# Patient Record
Sex: Male | Born: 1950 | Race: White | Hispanic: No | Marital: Married | Smoking: Former smoker
Health system: Southern US, Community
[De-identification: ages and names within clinical notes are randomized; demographics above are authoritative.]

## PROBLEM LIST (undated history)

## (undated) DIAGNOSIS — R161 Splenomegaly, not elsewhere classified: Secondary | ICD-10-CM

## (undated) DIAGNOSIS — M797 Fibromyalgia: Secondary | ICD-10-CM

## (undated) DIAGNOSIS — I441 Atrioventricular block, second degree: Secondary | ICD-10-CM

## (undated) DIAGNOSIS — E039 Hypothyroidism, unspecified: Secondary | ICD-10-CM

## (undated) DIAGNOSIS — J449 Chronic obstructive pulmonary disease, unspecified: Secondary | ICD-10-CM

## (undated) DIAGNOSIS — M19049 Primary osteoarthritis, unspecified hand: Secondary | ICD-10-CM

## (undated) DIAGNOSIS — I509 Heart failure, unspecified: Secondary | ICD-10-CM

## (undated) DIAGNOSIS — E785 Hyperlipidemia, unspecified: Secondary | ICD-10-CM

## (undated) DIAGNOSIS — Z79899 Other long term (current) drug therapy: Secondary | ICD-10-CM

## (undated) DIAGNOSIS — J45909 Unspecified asthma, uncomplicated: Secondary | ICD-10-CM

## (undated) DIAGNOSIS — R0600 Dyspnea, unspecified: Secondary | ICD-10-CM

## (undated) DIAGNOSIS — I440 Atrioventricular block, first degree: Secondary | ICD-10-CM

## (undated) DIAGNOSIS — E119 Type 2 diabetes mellitus without complications: Secondary | ICD-10-CM

## (undated) DIAGNOSIS — M1990 Unspecified osteoarthritis, unspecified site: Secondary | ICD-10-CM

## (undated) DIAGNOSIS — I350 Nonrheumatic aortic (valve) stenosis: Secondary | ICD-10-CM

## (undated) DIAGNOSIS — Z950 Presence of cardiac pacemaker: Secondary | ICD-10-CM

## (undated) DIAGNOSIS — I4891 Unspecified atrial fibrillation: Secondary | ICD-10-CM

## (undated) DIAGNOSIS — I517 Cardiomegaly: Secondary | ICD-10-CM

## (undated) DIAGNOSIS — I340 Nonrheumatic mitral (valve) insufficiency: Secondary | ICD-10-CM

## (undated) DIAGNOSIS — J189 Pneumonia, unspecified organism: Secondary | ICD-10-CM

## (undated) DIAGNOSIS — G2581 Restless legs syndrome: Secondary | ICD-10-CM

## (undated) DIAGNOSIS — E669 Obesity, unspecified: Secondary | ICD-10-CM

## (undated) DIAGNOSIS — R011 Cardiac murmur, unspecified: Secondary | ICD-10-CM

## (undated) DIAGNOSIS — Z5181 Encounter for therapeutic drug level monitoring: Secondary | ICD-10-CM

## (undated) DIAGNOSIS — I10 Essential (primary) hypertension: Secondary | ICD-10-CM

## (undated) DIAGNOSIS — R0609 Other forms of dyspnea: Secondary | ICD-10-CM

## (undated) DIAGNOSIS — K219 Gastro-esophageal reflux disease without esophagitis: Secondary | ICD-10-CM

## (undated) DIAGNOSIS — G4733 Obstructive sleep apnea (adult) (pediatric): Secondary | ICD-10-CM

## (undated) DIAGNOSIS — K760 Fatty (change of) liver, not elsewhere classified: Secondary | ICD-10-CM

## (undated) HISTORY — DX: Obesity, unspecified: E66.9

## (undated) HISTORY — DX: Unspecified atrial fibrillation: I48.91

## (undated) HISTORY — DX: Cardiomegaly: I51.7

## (undated) HISTORY — DX: Nonrheumatic aortic (valve) stenosis: I35.0

## (undated) HISTORY — DX: Essential (primary) hypertension: I10

## (undated) HISTORY — DX: Unspecified osteoarthritis, unspecified site: M19.90

## (undated) HISTORY — DX: Obstructive sleep apnea (adult) (pediatric): G47.33

## (undated) HISTORY — DX: Nonrheumatic mitral (valve) insufficiency: I34.0

## (undated) HISTORY — PX: INSERT / REPLACE / REMOVE PACEMAKER: SUR710

## (undated) HISTORY — PX: KIDNEY SURGERY: SHX687

## (undated) HISTORY — DX: Hyperlipidemia, unspecified: E78.5

## (undated) HISTORY — DX: Hypothyroidism, unspecified: E03.9

## (undated) HISTORY — PX: LUMBAR DISC SURGERY: SHX700

## (undated) HISTORY — PX: FINGER TENDON REPAIR: SHX1640

## (undated) HISTORY — PX: BACK SURGERY: SHX140

## (undated) HISTORY — PX: FINGER SURGERY: SHX640

## (undated) HISTORY — DX: Fibromyalgia: M79.7

---
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
## 1998-02-13 ENCOUNTER — Ambulatory Visit (HOSPITAL_COMMUNITY): Admission: RE | Admit: 1998-02-13 | Discharge: 1998-02-13 | Payer: Self-pay | Admitting: Family Medicine

## 2000-03-05 ENCOUNTER — Encounter: Admission: RE | Admit: 2000-03-05 | Discharge: 2000-03-05 | Payer: Self-pay | Admitting: Family Medicine

## 2000-03-05 ENCOUNTER — Encounter: Payer: Self-pay | Admitting: Family Medicine

## 2002-02-23 ENCOUNTER — Encounter: Admission: RE | Admit: 2002-02-23 | Discharge: 2002-02-23 | Payer: Self-pay

## 2002-03-09 ENCOUNTER — Encounter: Payer: Self-pay | Admitting: Neurosurgery

## 2002-03-09 ENCOUNTER — Ambulatory Visit (HOSPITAL_COMMUNITY): Admission: RE | Admit: 2002-03-09 | Discharge: 2002-03-09 | Payer: Self-pay | Admitting: Neurosurgery

## 2002-03-11 HISTORY — PX: DOPPLER ECHOCARDIOGRAPHY: SHX263

## 2002-03-12 HISTORY — PX: CARDIOVASCULAR STRESS TEST: SHX262

## 2002-03-23 ENCOUNTER — Inpatient Hospital Stay (HOSPITAL_COMMUNITY): Admission: RE | Admit: 2002-03-23 | Discharge: 2002-03-24 | Payer: Self-pay | Admitting: Neurosurgery

## 2002-03-23 ENCOUNTER — Encounter: Payer: Self-pay | Admitting: Neurosurgery

## 2002-06-01 ENCOUNTER — Ambulatory Visit (HOSPITAL_COMMUNITY): Admission: RE | Admit: 2002-06-01 | Discharge: 2002-06-01 | Payer: Self-pay | Admitting: Cardiovascular Disease

## 2009-02-23 ENCOUNTER — Ambulatory Visit (HOSPITAL_BASED_OUTPATIENT_CLINIC_OR_DEPARTMENT_OTHER): Admission: RE | Admit: 2009-02-23 | Discharge: 2009-02-23 | Payer: Self-pay | Admitting: Family Medicine

## 2009-02-25 ENCOUNTER — Ambulatory Visit: Payer: Self-pay | Admitting: Internal Medicine

## 2009-08-07 HISTORY — PX: US ECHOCARDIOGRAPHY: HXRAD669

## 2010-05-03 ENCOUNTER — Ambulatory Visit (HOSPITAL_COMMUNITY)
Admission: RE | Admit: 2010-05-03 | Discharge: 2010-05-03 | Payer: Self-pay | Source: Home / Self Care | Attending: Cardiology | Admitting: Cardiology

## 2010-05-03 LAB — CBC
HCT: 42.5 % (ref 39.0–52.0)
Hemoglobin: 14.6 g/dL (ref 13.0–17.0)
MCH: 30.5 pg (ref 26.0–34.0)
MCHC: 34.4 g/dL (ref 30.0–36.0)
MCV: 88.9 fL (ref 78.0–100.0)
Platelets: 195 10*3/uL (ref 150–400)
RBC: 4.78 MIL/uL (ref 4.22–5.81)
RDW: 13.7 % (ref 11.5–15.5)
WBC: 9.3 10*3/uL (ref 4.0–10.5)

## 2010-05-03 LAB — BASIC METABOLIC PANEL
BUN: 16 mg/dL (ref 6–23)
CO2: 31 mEq/L (ref 19–32)
Calcium: 8.8 mg/dL (ref 8.4–10.5)
Chloride: 100 mEq/L (ref 96–112)
Creatinine, Ser: 1.19 mg/dL (ref 0.4–1.5)
GFR calc Af Amer: >60 mL/min (ref >60)
GFR calc non Af Amer: >60 mL/min (ref >60)
Glucose, Bld: 96 mg/dL (ref 70–99)
Potassium: 3.4 mEq/L — ABNORMAL LOW (ref 3.5–5.1)
Sodium: 140 mEq/L (ref 135–145)

## 2010-05-03 LAB — PROTIME-INR
INR: 1.36 (ref 0.00–1.49)
Prothrombin Time: 17 seconds — ABNORMAL HIGH (ref 11.6–15.2)

## 2010-05-03 LAB — APTT: aPTT: 61 seconds — ABNORMAL HIGH (ref 24–37)

## 2010-09-14 NOTE — Op Note (Signed)
NAME:  [name], [name]                         ACCOUNT NO.:  [id number]   MEDICAL RECORD NO.:  [id number]                   PATIENT TYPE:  INP   LOCATION:  3172                                 FACILITY:  [facility]   PHYSICIAN:  Danae Orleans. Venetia Maxon, M.D.               DATE OF BIRTH:  1950/10/14   DATE OF PROCEDURE:  03/23/2002  DATE OF DISCHARGE:                                 OPERATIVE REPORT   PREOPERATIVE DIAGNOSIS:  Far lateral herniated lumbar disk, L3-4 right with  spondylosis, degenerative disk disease, and lumbar radiculopathy.   POSTOPERATIVE DIAGNOSIS:  Far lateral herniated lumbar disk, L3-4 right with  spondylosis, degenerative disk disease, and lumbar radiculopathy.   PROCEDURE:  Right L3-4 far lateral MET-Rx microdiskectomy with fluoroscopy  and microdissection.   SURGEON:  Danae Orleans. Venetia Maxon, M.D.   ASSISTANT:  Hewitt Shorts, M.D.   ANESTHESIA:  General endotracheal anesthesia.   ESTIMATED BLOOD LOSS:  100 cc.   COMPLICATIONS:  None.   DISPOSITION:  To recovery.   INDICATIONS FOR PROCEDURE:  The patient is a 60 year old man with far  lateral disk herniation at L3-4 on the right with severe right L3  radiculopathy.  It was elected to take him to surgery for MET-Rx far lateral  microdiskectomy.   DESCRIPTION OF PROCEDURE:  The patient is brought to the operating room.  Following satisfactory and uncomplicated induction of general endotracheal  anesthesia and placement of intravenous lines, the patient was turned to a  prone position on the Wilson frame. His low back was then shaved, prepped  and draped in the usual sterile fashion.  Area of plane incision was  infiltrated with 0.25% Marcaine, 0.5% lidocaine with 1:200,000 epinephrine.  Using fluoroscopy, the L3-4 interspace was confirmed and a stab incision was  made with a 10 blade, thumbbreadth off the midline at the L3-4 level  confirming the positioning on AP and lateral fluoroscopy.  The dilator was  inserted and vigorous periosteal stripping at the L3-4 level laterally  overlying the facet joint was then performed.  Subsequently a fascial  incision was made with a 10 blade and the sequential dilators were placed  until eventually a 6 cm long x 18 mm MET-Rx tube was placed.  This was  centered just above the interspace and with the lateral edge of the pars  intra-articularis three in the field.  The microscope was then brought into  the field and using microdissection technique, the pars was cleared of the  muscular layers.  This was then subsequently drilled down using the Answack  drill and a 2 Quiltland bur, the lateral aspect of the pars was then removed  with 3 mm Kerrison rongeur.  The intertransverse muscle was identified,  incised, and removed, and the lateral ligamentum flavum was then removed  resulting in exposure of the L3-4 interspace and the L3 nerve root  laterally.  There was a free fragment  of disk material which was identified  directly beneath the L3 nerve root and the disk material was removed.  The  interspace was then opened with a 15 blade laterally and free fragments of  disk material were removed as well as clearing of the lateral aspect of the  interspace.  Subsequently the microdissector was easily passed and coronary  dilators were easily passed over and under the L3 nerve root and this was  felt to be well decompressed.  Hemostasis was assured with Gelfoam soaked in  thrombin, bipolar electrocautery.  The interspace was irrigated.  There was  no evidence of residual disk material.  Redundant annular edges were then  cauterized with bipolar electrocautery.  2 cc of fentanyl and 80 mg of Depo-  Medrol were placed in the surgical cavity.  The self-retaining retractor was  then removed, the microscope was taken out of the field. The lumbodorsal  fascia was closed with 2-0 Vicryl sutures.  Skin edges were reapproximated  with interrupted 3-0 Vicryl subcuticular  stitch. The wound was dressed with  Benzoin, Steri-Strips, Telfa gauze, and tape.  The patient was extubated in  the operating room and taken to the recovery room in stable and satisfactory  condition having tolerated his operation well.  Needle, sponge, and  instrument count correct at the end of the case.                                               Danae Orleans. Venetia Maxon, M.D.    JDS/MEDQ  D:  03/23/2002  T:  03/23/2002  Job:  [id number]

## 2011-07-18 ENCOUNTER — Encounter: Payer: Self-pay | Admitting: *Deleted

## 2011-11-13 ENCOUNTER — Encounter: Payer: Self-pay | Admitting: Internal Medicine

## 2011-11-13 ENCOUNTER — Ambulatory Visit (INDEPENDENT_AMBULATORY_CARE_PROVIDER_SITE_OTHER): Payer: BC Managed Care – PPO | Admitting: Internal Medicine

## 2011-11-13 VITALS — BP 146/82 | HR 61 | Resp 18 | Ht 71.0 in | Wt 228.8 lb

## 2011-11-13 DIAGNOSIS — R5383 Other fatigue: Secondary | ICD-10-CM | POA: Insufficient documentation

## 2011-11-13 DIAGNOSIS — I480 Paroxysmal atrial fibrillation: Secondary | ICD-10-CM | POA: Insufficient documentation

## 2011-11-13 DIAGNOSIS — R5381 Other malaise: Secondary | ICD-10-CM

## 2011-11-13 DIAGNOSIS — I350 Nonrheumatic aortic (valve) stenosis: Secondary | ICD-10-CM | POA: Insufficient documentation

## 2011-11-13 DIAGNOSIS — I4891 Unspecified atrial fibrillation: Secondary | ICD-10-CM

## 2011-11-13 DIAGNOSIS — I10 Essential (primary) hypertension: Secondary | ICD-10-CM | POA: Insufficient documentation

## 2011-11-13 DIAGNOSIS — G4730 Sleep apnea, unspecified: Secondary | ICD-10-CM

## 2011-11-13 DIAGNOSIS — R011 Cardiac murmur, unspecified: Secondary | ICD-10-CM

## 2011-11-13 MED ORDER — DILTIAZEM HCL ER COATED BEADS 120 MG PO CP24: 120.0000 mg | ORAL_CAPSULE | Freq: Every day | ORAL | Status: DC

## 2011-11-13 NOTE — Assessment & Plan Note (Signed)
He is on multiple antihypertensive medicines. Stop metoprolol and add diltiazem as above.  If he does not receive clinical benefit with this change then I would plan to eventually stop diltiazem as he is already on norvasc.

## 2011-11-13 NOTE — Patient Instructions (Addendum)
Your physician recommends that you schedule a follow-up appointment in: 6 weeks with Dr Johney Frame  Your physician has requested that you have an echocardiogram. Echocardiography is a painless test that uses sound waves to create images of your heart. It provides your doctor with information about the size and shape of your heart and how well your heart's chambers and valves are working. This procedure takes approximately one hour. There are no restrictions for this procedure.---Dr Jacinto Halim will call to order  Your physician has recommended that you have a sleep study. This test records several body functions during sleep, including: brain activity, eye movement, oxygen and carbon dioxide blood levels, heart rate and rhythm, breathing rate and rhythm, the flow of air through your mouth and nose, snoring, body muscle movements, and chest and belly movement.   Your physician has recommended you make the following change in your medication:  1) Stop Metoprolol 2) Start Cardizem 120mg  daily

## 2011-11-13 NOTE — Progress Notes (Signed)
Primary Care Physician: Juline Patch, MD Referring Physician:  Dr Salome Spotted is a 61 y.o. male with a h/o longstanding persistent atrial fibrillation who presents today for EP consultation.  He was originally diagnosed with atrial fibrillation in 2004.  He presented for back surgery and was found to have Afib on routine preoperative ekg.  He was completely unaware of afib at that time.  He was cardioverted at that time to sinus rhythm.  He is clear that he did not feel better upon cardioversion to sinus rhythm.  He eventually returned to afib (he is not sure when).  He was evaluated by Dr Jacinto Halim and placed on flecainide.  He underwent repeat cardioversion 04/2010 which was unsuccessful.  His flecainide was therefore stopped.  He has been in afib at least since January of 2012.  He has had progressive fatigue and decreased exercise tolerance since that time.  Presently, he reports that he is "exhausted" when he gets home in the afternoon from work.  He does not feel well rested most mornings upon waking.  He snores at night.  He had a sleep study 2010 which revealed mild sleep apnea. He reports SOB occasionally.  He describes this is "I want to take a deep breath but I cant".  Today, he denies symptoms of palpitations, chest pain,  lower extremity edema, dizziness, presyncope, syncope, or neurologic sequela.  He is primarily limited by arthritis, back pain, and leg pain.  The patient is tolerating medications without difficulties and is otherwise without complaint today.   Past Medical History  Diagnosis Date  . Atrial fibrillation     longstanding persistent  . Hypertension   . Hypothyroidism   . Hyperlipidemia   . Obstructive sleep apnea     mild  . Fibromyalgia   . Chronic back pain   . DJD (degenerative joint disease)   . Obesity    Past Surgical History  Procedure Date  . Back surgery     X2  . Hand surgery   . US echocardiography 08/07/2009    EF 55-60%  . Doppler  echocardiography 03/11/2002    EF 70-75%  . Cardiovascular stress test 03/12/2002    EF 48%, NO EVIDENCE OF ISCHEMIA    Current Outpatient Prescriptions  Medication Sig Dispense Refill  . aliskiren (TEKTURNA) 150 MG tablet Take 150 mg by mouth daily.      [name] amLODipine (NORVASC) 5 MG tablet Take 5 mg by mouth daily.      [name] aspirin 81 MG tablet Take 81 mg by mouth daily.      . Azilsartan-Chlorthalidone 40-25 MG TABS Take 1 tablet by mouth every morning.      . Cholecalciferol (VITAMIN D3) 3000 UNITS TABS Take by mouth as directed.      . dabigatran (PRADAXA) 150 MG CAPS Take 150 mg by mouth every 12 (twelve) hours.      [name] doxazosin (CARDURA) 8 MG tablet Take 8 mg by mouth 2 (two) times daily.      . DULoxetine (CYMBALTA) 60 MG capsule Take 60 mg by mouth daily.      . fish oil-omega-3 fatty acids 1000 MG capsule Take 1,200 mg by mouth daily.      . furosemide (LASIX) 20 MG tablet Take 20 mg by mouth 2 (two) times daily.      [name] levothyroxine (SYNTHROID, LEVOTHROID) 137 MCG tablet Take 137 mcg by mouth daily.      . meloxicam (MOBIC) 15 MG  tablet Take 15 mg by mouth daily.      . metoprolol (LOPRESSOR) 50 MG tablet Take 50 mg by mouth 2 (two) times daily.      [name] zolpidem (AMBIEN) 10 MG tablet Take 10 mg by mouth at bedtime as needed.      [name] telmisartan-hydrochlorothiazide (MICARDIS HCT) 80-25 MG per tablet Take 1 tablet by mouth daily.        No Known Allergies  History   Social History  . Marital Status: Married    Spouse Name: N/A    Number of Children: N/A  . Years of Education: N/A   Occupational History  . Not on file.   Social History Main Topics  . Smoking status: Former Smoker    Quit date: 07/18/1991  . Smokeless tobacco: Not on file  . Alcohol Use: No  . Drug Use: No  . Sexually Active:    Other Topics Concern  . Not on file   Social History Narrative   Lives in [place] with spouse.  Works as a Armed forces training and education officer.    Family History  Problem Relation  Age of Onset  . Heart failure Mother   . Stroke Father     ROS- All systems are reviewed and negative except as per the HPI above  Physical Exam: Filed Vitals:   11/13/11 1627  BP: 146/82  Pulse: 61  Resp: 18  Height: 5\' 11"  (1.803 m)  Weight: 228 lb 12.8 oz (103.783 kg)  SpO2: 97%    GEN- The patient is well appearing, alert and oriented x 3 today.   Head- normocephalic, atraumatic Eyes-  Sclera clear, conjunctiva pink Ears- hearing intact Oropharynx- clear Neck- supple, no JVP Lymph- no cervical lymphadenopathy Lungs- Clear to ausculation bilaterally, normal work of breathing Heart- irregular rate and rhythm, 2/6 SEM LLSB and apex GI- soft, NT, ND, + BS Extremities- no clubbing, cyanosis, +1 edema MS- no significant deformity or atrophy Skin- no rash or lesion Psych- euthymic mood, full affect Neuro- strength and sensation are intact  EKG today reveals atrial fibrillation with ventricular rate 66 bpm, nonspecific ST/T changes  Assessment and Plan:

## 2011-11-13 NOTE — Assessment & Plan Note (Addendum)
The patient has longstanding persistent atrial fibrillation.  He was initially asymptomatic for years with atrial fibrillation.  He now has progressive fatigue and SOB.  I find it unlikely that asymptomatic atrial fibrillation would have all of a sudden become symptomatic and therefore think that we should look for other causes for his symptoms.  He has been in atrial fibrillation persistently for more than a year.  I think that our ability to achieve and maintain sinus rhythm would be very limited at best.  He is well rate controlled and appropriately anticoagulation. At this point, I think that we should repeat a sleep study to further evaluate his fatigue.  His sleep apnea was mild in 2010 and may have progressed.  I will therefore order a sleep study. In addition, he has not had a recent echo.  He has MR by exam.  I think that an echo to evaluate for recent structural changes is necessary.  Finally, I will stop metoprolol and start low dose diltiazem.  Though he did not feel better with bystolic, it may be that if we avoid beta blockers all together he may do well.  I will see him back in 6 weeks for further evaluation after the above. After other causes for his symptoms have been excluded, then at that point, we could more aggressively pursue sinus rhythm. I think that the most prudent approach would be to initiate amiodarone followed by cardioversion.  If he feels better, then we could aggressively pursue sinus rhythm long term.  IF he does not feel much better, then we could return to a rate control strategy thereafter. The patient and his spouse are in agreement with this plan.

## 2011-11-13 NOTE — Assessment & Plan Note (Signed)
Proceed as above

## 2011-11-13 NOTE — Assessment & Plan Note (Signed)
Previously mild sleep apnea in 2010.  Given worsening fatigue and ongoing snoring, I think that a repeat sleep study may be helpful at this point.

## 2011-11-13 NOTE — Assessment & Plan Note (Signed)
I have asked Dr Jacinto Halim to repeat an echo and forward results to me at this time.

## 2011-12-04 ENCOUNTER — Telehealth: Payer: Self-pay | Admitting: Internal Medicine

## 2011-12-04 NOTE — Telephone Encounter (Signed)
PT'S WIFE CALLING RE AUTH FOR SLEEP STUDY WITH DR Johney Frame, WOULD LIKE A CALL

## 2011-12-04 NOTE — Telephone Encounter (Signed)
Leanne was helping in precert and has given this to [place] to check with Dr. Oris Drone, do you have any update on this? Thanks

## 2011-12-04 NOTE — Telephone Encounter (Signed)
Have left message with patient's wife to call me back

## 2011-12-06 ENCOUNTER — Ambulatory Visit (HOSPITAL_BASED_OUTPATIENT_CLINIC_OR_DEPARTMENT_OTHER): Payer: BC Managed Care – PPO

## 2011-12-09 NOTE — Telephone Encounter (Signed)
Will have the patient reset up and arrange for the test to be done.  This will be run through pre-cert again.  Spoke with his wife and explained the problem to her.  She was very understanding

## 2011-12-26 ENCOUNTER — Ambulatory Visit (HOSPITAL_BASED_OUTPATIENT_CLINIC_OR_DEPARTMENT_OTHER): Payer: BC Managed Care – PPO | Attending: Internal Medicine | Admitting: Radiology

## 2011-12-26 VITALS — Ht 71.0 in | Wt 235.0 lb

## 2011-12-26 DIAGNOSIS — G4733 Obstructive sleep apnea (adult) (pediatric): Secondary | ICD-10-CM | POA: Insufficient documentation

## 2011-12-26 DIAGNOSIS — I4949 Other premature depolarization: Secondary | ICD-10-CM | POA: Insufficient documentation

## 2011-12-26 DIAGNOSIS — I4891 Unspecified atrial fibrillation: Secondary | ICD-10-CM

## 2011-12-27 ENCOUNTER — Ambulatory Visit: Payer: BC Managed Care – PPO | Admitting: Internal Medicine

## 2012-01-01 DIAGNOSIS — G4710 Hypersomnia, unspecified: Secondary | ICD-10-CM

## 2012-01-01 DIAGNOSIS — G4730 Sleep apnea, unspecified: Secondary | ICD-10-CM

## 2012-01-02 NOTE — Procedures (Signed)
[name], [name]               ACCOUNT NO.:  [id number]  MEDICAL RECORD NO.:  [id number]          PATIENT TYPE:  OUT  LOCATION:  SLEEP CENTER                 FACILITY:  [facility]  PHYSICIAN:  Barbaraann Share, MD,FCCPDATE OF BIRTH:  13-Nov-1949  DATE OF STUDY:  12/26/2011                           NOCTURNAL POLYSOMNOGRAM  REFERRING PHYSICIAN:  [name], MD  LOCATION:  Sleep lab.  INDICATION FOR STUDY:  Hypersomnia with sleep apnea.  EPWORTH SLEEPINESS SCORE:  4.  MEDICATIONS:  SLEEP ARCHITECTURE:  The patient had a total sleep time of 318 minutes with no slow-wave sleep and only 46 minutes of REM.  Sleep onset latency was normal at 12 minutes and REM onset was prolonged at 204 minutes. Sleep efficiency was moderately reduced at 81%.  RESPIRATORY DATA:  The patient was noted to have 26 apneas and 35 obstructive hypopneas, giving him an apnea-hypopnea index of 12 events per hour.  He was also noted to have large numbers of respiratory effort related arousals that did not meet the classic criteria for obstructive hypopnea.  The events occurred in all body positions and there was moderate snoring noted throughout.  The patient did not meet split night protocol secondary to his small numbers of events within the time allotted to meet criteria.  OXYGEN DATA:  There was O2 desaturation as low as 89% during the patient's obstructive events.  CARDIAC DATA:  The patient was noted to be in atrial fibrillation with occasional PVCs.  MOVEMENT-PARASOMNIA:  Moderate numbers of leg jerks were noted with no significant sleep disruption.  No abnormal behaviors were seen.  IMPRESSIONS-RECOMMENDATIONS: 1. Mild obstructive sleep apnea/hypopnea syndrome with an AHI of 12     events per hour and O2 desaturation as low as 89%.  Treatment for     this degree of sleep apnea can include a trial of weight loss     alone, upper airway surgery, dental appliance, and also CPAP.  The     decision to  treat this mild degree of sleep apnea should be based     on its impact to the patient's quality of life.  The decision will     also need to be made whether this is     adversely impacting his atrial arrhythmia. 2. Atrial fibrillation with occasional PVCs noted.     Barbaraann Share, MD,FCCP Diplomate, American Board of Sleep Medicine    [facility]/MEDQ  D:  01/01/2012 08:38:58  T:  01/02/2012 01:49:02  Job:  [id number]

## 2012-01-16 ENCOUNTER — Encounter: Payer: Self-pay | Admitting: Internal Medicine

## 2012-01-16 ENCOUNTER — Ambulatory Visit (INDEPENDENT_AMBULATORY_CARE_PROVIDER_SITE_OTHER): Payer: BC Managed Care – PPO | Admitting: Internal Medicine

## 2012-01-16 VITALS — BP 143/82 | HR 58 | Ht 71.0 in | Wt 234.0 lb

## 2012-01-16 DIAGNOSIS — G4730 Sleep apnea, unspecified: Secondary | ICD-10-CM

## 2012-01-16 DIAGNOSIS — I4891 Unspecified atrial fibrillation: Secondary | ICD-10-CM

## 2012-01-16 DIAGNOSIS — I10 Essential (primary) hypertension: Secondary | ICD-10-CM

## 2012-01-16 NOTE — Assessment & Plan Note (Signed)
Stable No change required today  

## 2012-01-16 NOTE — Progress Notes (Signed)
 Primary Care Physician: PANG,RICHARD, MD Referring Physician:  Dr Ganji   [name] is a 61 y.o. male with a h/o longstanding persistent atrial fibrillation who presents today for EP following. He was originally diagnosed with atrial fibrillation in 2004.  He presented for back surgery and was found to have Afib on routine preoperative ekg.  He was cardioverted at that time to sinus rhythm.  He eventually returned to afib.  He was evaluated by Dr Ganji and placed on flecainide.  He underwent repeat cardioversion 04/2010 which was unsuccessful.  His flecainide was therefore stopped.  He has been in afib at least since January of 2012.  He has had progressive fatigue and decreased exercise tolerance since that time.  He reports that his legs are "tired".  Presently, he reports that he is "exhausted" when he gets home in the afternoon from work.   He had a sleep study 2010 which revealed mild sleep apnea.  This has been repeated and reveals stable mild OSA.     Since last being seen in our clinic, he continues to have fatigue.  This did not improve off of beta blockers.    Today, he denies symptoms of palpitations, chest pain,  lower extremity edema, dizziness, presyncope, syncope, or neurologic sequela.  He is primarily limited by arthritis, back pain, and leg pain.  The patient is tolerating medications without difficulties and is otherwise without complaint today.   Past Medical History  Diagnosis Date  . Atrial fibrillation     longstanding persistent  . Hypertension   . Hypothyroidism   . Hyperlipidemia   . Obstructive sleep apnea     mild by sleep study 2013  . Fibromyalgia   . Chronic back pain   . DJD (degenerative joint disease)   . Obesity   . Mitral regurgitation     trivial  . Aortic stenosis, mild   . Left atrial enlargement     LA size 45mm by echo 11/21/11   Past Surgical History  Procedure Date  . Back surgery     X2  . Hand surgery   . Us echocardiography  08/07/2009    EF 55-60%  . Doppler echocardiography 03/11/2002    EF 70-75%  . Cardiovascular stress test 03/12/2002    EF 48%, NO EVIDENCE OF ISCHEMIA    Current Outpatient Prescriptions  Medication Sig Dispense Refill  . aliskiren (TEKTURNA) 150 MG tablet Take 150 mg by mouth daily.      . amLODipine (NORVASC) 5 MG tablet Take 5 mg by mouth daily.      . aspirin 81 MG tablet Take 81 mg by mouth daily.      . Azilsartan-Chlorthalidone 40-25 MG TABS Take 1 tablet by mouth every morning.      . Cholecalciferol (VITAMIN D3) 3000 UNITS TABS Take 1 tablet by mouth daily.       . cyclobenzaprine (FLEXERIL) 10 MG tablet Take 1 tablet by mouth as needed.      . dabigatran (PRADAXA) 150 MG CAPS Take 150 mg by mouth every 12 (twelve) hours.      . diltiazem (CARDIZEM CD) 120 MG 24 hr capsule Take 1 capsule (120 mg total) by mouth daily.  90 capsule  3  . DULoxetine (CYMBALTA) 60 MG capsule Take 60 mg by mouth daily.      . fish oil-omega-3 fatty acids 1000 MG capsule Take 1,200 mg by mouth as needed.       . furosemide (LASIX)   20 MG tablet Take 20 mg by mouth as needed.       . levothyroxine (SYNTHROID, LEVOTHROID) 137 MCG tablet Take 137 mcg by mouth daily.      . Melatonin 3 MG CAPS Take 1 capsule by mouth at bedtime.      . meloxicam (MOBIC) 15 MG tablet Take 15 mg by mouth daily.      . pravastatin (PRAVACHOL) 80 MG tablet Take 1 tablet by mouth Daily.      . traMADol-acetaminophen (ULTRACET) 37.5-325 MG per tablet PRN      . zolpidem (AMBIEN) 10 MG tablet Take 10 mg by mouth at bedtime as needed.        No Known Allergies  History   Social History  . Marital Status: Married    Spouse Name: N/A    Number of Children: N/A  . Years of Education: N/A   Occupational History  . Not on file.   Social History Main Topics  . Smoking status: Former Smoker    Quit date: 07/18/1991  . Smokeless tobacco: Not on file  . Alcohol Use: No  . Drug Use: No  . Sexually Active:    Other  Topics Concern  . Not on file   Social History Narrative   Lives in [place] with spouse.  Works as a maintenance technician.    Family History  Problem Relation Age of Onset  . Heart failure Mother   . Stroke Father     ROS- All systems are reviewed and negative except as per the HPI above  Physical Exam: Filed Vitals:   01/16/12 1632  BP: 143/82  Pulse: 58  Height: 5' 11" (1.803 m)  Weight: 234 lb (106.142 kg)    GEN- The patient is well appearing, alert and oriented x 3 today.   Head- normocephalic, atraumatic Eyes-  Sclera clear, conjunctiva pink Ears- hearing intact Oropharynx- clear Neck- supple, no JVP Lymph- no cervical lymphadenopathy Lungs- Clear to ausculation bilaterally, normal work of breathing Heart- irregular rate and rhythm, 2/6 SEM LLSB and apex GI- soft, NT, ND, + BS Extremities- no clubbing, cyanosis, +1 edema MS- no significant deformity or atrophy Skin- no rash or lesion Psych- euthymic mood, full affect Neuro- strength and sensation are intact  Echo and sleep study are reviewed  Assessment and Plan:  

## 2012-01-16 NOTE — Assessment & Plan Note (Signed)
Remains mild by recent sleep study Weight loss advised

## 2012-01-16 NOTE — Assessment & Plan Note (Signed)
The patient has persistent atrial fibrillation.  At this time, I feel that he is quite symptomatic.  He has failed medical therapy with flecainide.  His LA size is 45 mm.  Therapeutic strategies for afib including medicine and ablation were discussed in detail with the patient today.  We discussed tikosyn and amiodarone as alternatives to ablation.   Risk, benefits, and alternatives to EP study and radiofrequency ablation for afib were also discussed in detail today. These risks include but are not limited to stroke, bleeding, vascular damage, tamponade, perforation, damage to the esophagus, lungs, and other structures, pulmonary vein stenosis, worsening renal function, and death. The patient understands these risk and wishes to proceed.  We will therefore proceed with catheter ablation at the next available time.  He will require TEE prior to his ablation.

## 2012-01-16 NOTE — Patient Instructions (Addendum)
Tresa Endo or Kandee Keen will contact you next week with the details of your TEE and a-fib ablation which is tentatively scheduled for 02/03/2012 and 02/04/2012.

## 2012-01-20 ENCOUNTER — Other Ambulatory Visit: Payer: Self-pay | Admitting: *Deleted

## 2012-01-20 ENCOUNTER — Encounter: Payer: Self-pay | Admitting: *Deleted

## 2012-01-20 DIAGNOSIS — I4891 Unspecified atrial fibrillation: Secondary | ICD-10-CM

## 2012-01-20 DIAGNOSIS — Z01812 Encounter for preprocedural laboratory examination: Secondary | ICD-10-CM

## 2012-01-21 ENCOUNTER — Encounter: Payer: Self-pay | Admitting: *Deleted

## 2012-01-22 ENCOUNTER — Encounter: Payer: Self-pay | Admitting: *Deleted

## 2012-01-23 ENCOUNTER — Encounter (HOSPITAL_COMMUNITY): Payer: Self-pay | Admitting: Pharmacy Technician

## 2012-01-24 ENCOUNTER — Telehealth: Payer: Self-pay | Admitting: *Deleted

## 2012-01-24 ENCOUNTER — Encounter: Payer: Self-pay | Admitting: *Deleted

## 2012-01-24 ENCOUNTER — Other Ambulatory Visit (INDEPENDENT_AMBULATORY_CARE_PROVIDER_SITE_OTHER): Payer: BC Managed Care – PPO

## 2012-01-24 DIAGNOSIS — I4891 Unspecified atrial fibrillation: Secondary | ICD-10-CM

## 2012-01-24 DIAGNOSIS — Z01812 Encounter for preprocedural laboratory examination: Secondary | ICD-10-CM

## 2012-01-24 LAB — CBC WITH DIFFERENTIAL/PLATELET
Basophils Absolute: 0 10*3/uL (ref 0.0–0.1)
Basophils Relative: 0.4 % (ref 0.0–3.0)
Eosinophils Absolute: 0.1 10*3/uL (ref 0.0–0.7)
Eosinophils Relative: 1.2 % (ref 0.0–5.0)
HCT: 43.7 % (ref 39.0–52.0)
Hemoglobin: 14.6 g/dL (ref 13.0–17.0)
Lymphocytes Relative: 21.6 % (ref 12.0–46.0)
Lymphs Abs: 1.3 10*3/uL (ref 0.7–4.0)
MCHC: 33.4 g/dL (ref 30.0–36.0)
MCV: 91.8 fl (ref 78.0–100.0)
Monocytes Absolute: 0.9 10*3/uL (ref 0.1–1.0)
Monocytes Relative: 14.7 % — ABNORMAL HIGH (ref 3.0–12.0)
Neutro Abs: 3.7 10*3/uL (ref 1.4–7.7)
Neutrophils Relative %: 62.1 % (ref 43.0–77.0)
Platelets: 166 10*3/uL (ref 150.0–400.0)
RBC: 4.76 Mil/uL (ref 4.22–5.81)
RDW: 13.5 % (ref 11.5–14.6)
WBC: 6 10*3/uL (ref 4.5–10.5)

## 2012-01-24 LAB — BASIC METABOLIC PANEL
BUN: 21 mg/dL (ref 6–23)
CO2: 32 mEq/L (ref 19–32)
Calcium: 9.2 mg/dL (ref 8.4–10.5)
Chloride: 95 mEq/L — ABNORMAL LOW (ref 96–112)
Creatinine, Ser: 1.2 mg/dL (ref 0.4–1.5)
GFR: 66.33 mL/min (ref >60.00)
Glucose, Bld: 120 mg/dL — ABNORMAL HIGH (ref 70–99)
Potassium: 2.8 mEq/L — CL (ref 3.5–5.1)
Sodium: 136 mEq/L (ref 135–145)

## 2012-01-24 MED ORDER — POTASSIUM CHLORIDE CRYS ER 20 MEQ PO TBCR: 20.0000 meq | EXTENDED_RELEASE_TABLET | Freq: Two times a day (BID) | ORAL | Status: DC

## 2012-01-24 NOTE — Telephone Encounter (Signed)
Called patient in regards to his low Potassium  Per Dr Johney Frame want him to start Potassium today  Take now then again tonight and start taking daily.  Patient has Potassium but has not been taking.  When I spoke with him he said he had some already and would resume taking them.

## 2012-01-28 ENCOUNTER — Other Ambulatory Visit: Payer: BC Managed Care – PPO

## 2012-01-28 ENCOUNTER — Other Ambulatory Visit: Payer: Self-pay | Admitting: *Deleted

## 2012-01-28 ENCOUNTER — Other Ambulatory Visit (INDEPENDENT_AMBULATORY_CARE_PROVIDER_SITE_OTHER): Payer: BC Managed Care – PPO

## 2012-01-28 DIAGNOSIS — E876 Hypokalemia: Secondary | ICD-10-CM

## 2012-01-28 LAB — BASIC METABOLIC PANEL
BUN: 28 mg/dL — ABNORMAL HIGH (ref 6–23)
CO2: 32 mEq/L (ref 19–32)
Calcium: 9.6 mg/dL (ref 8.4–10.5)
Chloride: 97 mEq/L (ref 96–112)
Creatinine, Ser: 1.3 mg/dL (ref 0.4–1.5)
GFR: 62.06 mL/min (ref >60.00)
Glucose, Bld: 151 mg/dL — ABNORMAL HIGH (ref 70–99)
Potassium: 3 mEq/L — ABNORMAL LOW (ref 3.5–5.1)
Sodium: 138 mEq/L (ref 135–145)

## 2012-01-30 ENCOUNTER — Ambulatory Visit (HOSPITAL_COMMUNITY)
Admission: RE | Admit: 2012-01-30 | Discharge: 2012-01-30 | Disposition: A | Discharge status: Home / Self Care | Payer: BC Managed Care – PPO | Source: Ambulatory Visit | Attending: Cardiology | Admitting: Cardiology

## 2012-01-30 ENCOUNTER — Other Ambulatory Visit: Payer: BC Managed Care – PPO

## 2012-01-30 ENCOUNTER — Encounter (HOSPITAL_COMMUNITY)
Admission: RE | Disposition: A | Discharge status: Home / Self Care | Payer: Self-pay | Source: Ambulatory Visit | Attending: Cardiology

## 2012-01-30 ENCOUNTER — Encounter (HOSPITAL_COMMUNITY): Payer: Self-pay

## 2012-01-30 DIAGNOSIS — I059 Rheumatic mitral valve disease, unspecified: Secondary | ICD-10-CM | POA: Insufficient documentation

## 2012-01-30 DIAGNOSIS — I4891 Unspecified atrial fibrillation: Secondary | ICD-10-CM

## 2012-01-30 DIAGNOSIS — I700 Atherosclerosis of aorta: Secondary | ICD-10-CM | POA: Insufficient documentation

## 2012-01-30 HISTORY — PX: TEE WITHOUT CARDIOVERSION: SHX5443

## 2012-01-30 HISTORY — PX: ATRIAL FIBRILLATION ABLATION: SHX5732

## 2012-01-30 HISTORY — DX: Unspecified asthma, uncomplicated: J45.909

## 2012-01-30 SURGERY — ECHOCARDIOGRAM, TRANSESOPHAGEAL
Anesthesia: Moderate Sedation

## 2012-01-30 MED ORDER — MIDAZOLAM HCL 10 MG/2ML IJ SOLN
INTRAMUSCULAR | Status: DC | PRN
  Administered 2012-01-30 (×3): 2 mg via INTRAVENOUS
  Administered 2012-01-30 (×2): 1 mg via INTRAVENOUS

## 2012-01-30 MED ORDER — FENTANYL CITRATE 0.05 MG/ML IJ SOLN
INTRAMUSCULAR | Status: DC | PRN
  Administered 2012-01-30 (×4): 25 ug via INTRAVENOUS

## 2012-01-30 MED ORDER — SODIUM CHLORIDE 0.9 % IV SOLN: INTRAVENOUS | Status: DC

## 2012-01-30 MED ORDER — BUTAMBEN-TETRACAINE-BENZOCAINE 2-2-14 % EX AERO
INHALATION_SPRAY | CUTANEOUS | Status: DC | PRN
  Administered 2012-01-30: 2 via TOPICAL

## 2012-01-30 MED ORDER — FENTANYL CITRATE 0.05 MG/ML IJ SOLN
INTRAMUSCULAR | Status: AC
  Filled 2012-01-30: qty 4

## 2012-01-30 MED ORDER — MIDAZOLAM HCL 5 MG/ML IJ SOLN
INTRAMUSCULAR | Status: AC
  Filled 2012-01-30: qty 2

## 2012-01-30 MED ORDER — DIPHENHYDRAMINE HCL 50 MG/ML IJ SOLN
INTRAMUSCULAR | Status: AC
  Filled 2012-01-30: qty 1

## 2012-01-30 NOTE — H&P (View-Only) (Signed)
Primary Care Physician: Juline Patch, MD Referring Physician:  Dr [name] is a 61 y.o. male with a h/o longstanding persistent atrial fibrillation who presents today for EP following. He was originally diagnosed with atrial fibrillation in 2004.  He presented for back surgery and was found to have Afib on routine preoperative ekg.  He was cardioverted at that time to sinus rhythm.  He eventually returned to afib.  He was evaluated by Dr Jacinto Halim and placed on flecainide.  He underwent repeat cardioversion 04/2010 which was unsuccessful.  His flecainide was therefore stopped.  He has been in afib at least since January of 2012.  He has had progressive fatigue and decreased exercise tolerance since that time.  He reports that his legs are "tired".  Presently, he reports that he is "exhausted" when he gets home in the afternoon from work.   He had a sleep study 2010 which revealed mild sleep apnea.  This has been repeated and reveals stable mild OSA.     Since last being seen in our clinic, he continues to have fatigue.  This did not improve off of beta blockers.    Today, he denies symptoms of palpitations, chest pain,  lower extremity edema, dizziness, presyncope, syncope, or neurologic sequela.  He is primarily limited by arthritis, back pain, and leg pain.  The patient is tolerating medications without difficulties and is otherwise without complaint today.   Past Medical History  Diagnosis Date  . Atrial fibrillation     longstanding persistent  . Hypertension   . Hypothyroidism   . Hyperlipidemia   . Obstructive sleep apnea     mild by sleep study 2013  . Fibromyalgia   . Chronic back pain   . DJD (degenerative joint disease)   . Obesity   . Mitral regurgitation     trivial  . Aortic stenosis, mild   . Left atrial enlargement     LA size 45mm by echo 11/21/11   Past Surgical History  Procedure Date  . Back surgery     X2  . Hand surgery   . US echocardiography  08/07/2009    EF 55-60%  . Doppler echocardiography 03/11/2002    EF 70-75%  . Cardiovascular stress test 03/12/2002    EF 48%, NO EVIDENCE OF ISCHEMIA    Current Outpatient Prescriptions  Medication Sig Dispense Refill  . aliskiren (TEKTURNA) 150 MG tablet Take 150 mg by mouth daily.      [name] amLODipine (NORVASC) 5 MG tablet Take 5 mg by mouth daily.      [name] aspirin 81 MG tablet Take 81 mg by mouth daily.      . Azilsartan-Chlorthalidone 40-25 MG TABS Take 1 tablet by mouth every morning.      . Cholecalciferol (VITAMIN D3) 3000 UNITS TABS Take 1 tablet by mouth daily.       . cyclobenzaprine (FLEXERIL) 10 MG tablet Take 1 tablet by mouth as needed.      . dabigatran (PRADAXA) 150 MG CAPS Take 150 mg by mouth every 12 (twelve) hours.      [name] diltiazem (CARDIZEM CD) 120 MG 24 hr capsule Take 1 capsule (120 mg total) by mouth daily.  90 capsule  3  . DULoxetine (CYMBALTA) 60 MG capsule Take 60 mg by mouth daily.      . fish oil-omega-3 fatty acids 1000 MG capsule Take 1,200 mg by mouth as needed.       . furosemide (LASIX)  20 MG tablet Take 20 mg by mouth as needed.       [name] levothyroxine (SYNTHROID, LEVOTHROID) 137 MCG tablet Take 137 mcg by mouth daily.      . Melatonin 3 MG CAPS Take 1 capsule by mouth at bedtime.      . meloxicam (MOBIC) 15 MG tablet Take 15 mg by mouth daily.      . pravastatin (PRAVACHOL) 80 MG tablet Take 1 tablet by mouth Daily.      . traMADol-acetaminophen (ULTRACET) 37.5-325 MG per tablet PRN      . zolpidem (AMBIEN) 10 MG tablet Take 10 mg by mouth at bedtime as needed.        No Known Allergies  History   Social History  . Marital Status: Married    Spouse Name: N/A    Number of Children: N/A  . Years of Education: N/A   Occupational History  . Not on file.   Social History Main Topics  . Smoking status: Former Smoker    Quit date: 07/18/1991  . Smokeless tobacco: Not on file  . Alcohol Use: No  . Drug Use: No  . Sexually Active:    Other  Topics Concern  . Not on file   Social History Narrative   Lives in [place] with spouse.  Works as a Armed forces training and education officer.    Family History  Problem Relation Age of Onset  . Heart failure Mother   . Stroke Father     ROS- All systems are reviewed and negative except as per the HPI above  Physical Exam: Filed Vitals:   01/16/12 1632  BP: 143/82  Pulse: 58  Height: 5\' 11"  (1.803 m)  Weight: 234 lb (106.142 kg)    GEN- The patient is well appearing, alert and oriented x 3 today.   Head- normocephalic, atraumatic Eyes-  Sclera clear, conjunctiva pink Ears- hearing intact Oropharynx- clear Neck- supple, no JVP Lymph- no cervical lymphadenopathy Lungs- Clear to ausculation bilaterally, normal work of breathing Heart- irregular rate and rhythm, 2/6 SEM LLSB and apex GI- soft, NT, ND, + BS Extremities- no clubbing, cyanosis, +1 edema MS- no significant deformity or atrophy Skin- no rash or lesion Psych- euthymic mood, full affect Neuro- strength and sensation are intact  Echo and sleep study are reviewed  Assessment and Plan:

## 2012-01-30 NOTE — CV Procedure (Signed)
Procedure: TEE  Indication: pre-atrial fibrillation ablation.  Sedation: Versed 8 mg IV, Fentanyl 100 mcg IV  Findings:  Please see echo section for full report.  Normal LV size with mild LV hypertrophy.  EF 60-65%.  Moderate aortic stenosis.  No LAA thrombus.  OK for procedure tomorrow.   No complications.   Marca Ancona 01/30/2012

## 2012-01-30 NOTE — Progress Notes (Signed)
  Echocardiogram Echocardiogram Transesophageal has been performed.  CHUNG, KWONG 01/30/2012, 3:06 PM

## 2012-01-30 NOTE — OR Nursing (Signed)
Discussed Hypertension with Dr. Shirlee Latch.  His recommendation to patient is to increase in diltiazen to 240 mg daily and report to his general cardiologist.

## 2012-01-30 NOTE — Interval H&P Note (Signed)
History and Physical Interval Note:  01/30/2012 2:23 PM  [name]  has presented today for surgery, with the diagnosis of a-fib  The various methods of treatment have been discussed with the patient and family. After consideration of risks, benefits and other options for treatment, the patient has consented to  Procedure(s) (LRB) with comments: TRANSESOPHAGEAL ECHOCARDIOGRAM (TEE) (N/A) - ablation next day as a surgical intervention .  The patient's history has been reviewed, patient examined, no change in status, stable for surgery.  I have reviewed the patient's chart and labs.  Questions were answered to the patient's satisfaction.     Dalton [employer]

## 2012-01-31 ENCOUNTER — Ambulatory Visit (HOSPITAL_COMMUNITY)
Admission: RE | Admit: 2012-01-31 | Discharge: 2012-02-01 | Disposition: A | Discharge status: Home / Self Care | Payer: BC Managed Care – PPO | Source: Ambulatory Visit | Attending: Internal Medicine | Admitting: Internal Medicine

## 2012-01-31 ENCOUNTER — Encounter (HOSPITAL_COMMUNITY)
Admission: RE | Disposition: A | Discharge status: Home / Self Care | Payer: Self-pay | Source: Ambulatory Visit | Attending: Internal Medicine

## 2012-01-31 ENCOUNTER — Ambulatory Visit (HOSPITAL_COMMUNITY): Payer: BC Managed Care – PPO | Admitting: Anesthesiology

## 2012-01-31 ENCOUNTER — Encounter (HOSPITAL_COMMUNITY): Payer: Self-pay | Admitting: Anesthesiology

## 2012-01-31 DIAGNOSIS — I10 Essential (primary) hypertension: Secondary | ICD-10-CM | POA: Insufficient documentation

## 2012-01-31 DIAGNOSIS — I4891 Unspecified atrial fibrillation: Secondary | ICD-10-CM

## 2012-01-31 DIAGNOSIS — I480 Paroxysmal atrial fibrillation: Secondary | ICD-10-CM | POA: Diagnosis present

## 2012-01-31 DIAGNOSIS — I440 Atrioventricular block, first degree: Secondary | ICD-10-CM

## 2012-01-31 HISTORY — DX: Atrioventricular block, first degree: I44.0

## 2012-01-31 HISTORY — PX: ATRIAL FIBRILLATION ABLATION: SHX5456

## 2012-01-31 LAB — POCT ACTIVATED CLOTTING TIME
Activated Clotting Time: 244 seconds
Activated Clotting Time: 249 seconds
Activated Clotting Time: 189 seconds
Activated Clotting Time: 284 s

## 2012-01-31 LAB — BASIC METABOLIC PANEL WITH GFR
BUN: 24 mg/dL — ABNORMAL HIGH (ref 6–23)
CO2: 31 meq/L (ref 19–32)
Calcium: 10 mg/dL (ref 8.4–10.5)
Chloride: 96 meq/L (ref 96–112)
Creatinine, Ser: 0.93 mg/dL (ref 0.50–1.35)
GFR calc Af Amer: >90 mL/min (ref >90)
GFR calc non Af Amer: 88 mL/min — ABNORMAL LOW (ref >90)
Glucose, Bld: 110 mg/dL — ABNORMAL HIGH (ref 70–99)
Potassium: 3.7 meq/L (ref 3.5–5.1)
Sodium: 137 meq/L (ref 135–145)

## 2012-01-31 LAB — MRSA PCR SCREENING: MRSA by PCR: NEGATIVE

## 2012-01-31 SURGERY — ATRIAL FIBRILLATION ABLATION
Anesthesia: Monitor Anesthesia Care

## 2012-01-31 MED ORDER — SODIUM CHLORIDE 0.9 % IV SOLN: 250.0000 mL | INTRAVENOUS | Status: DC | PRN

## 2012-01-31 MED ORDER — DEXAMETHASONE SODIUM PHOSPHATE 4 MG/ML IJ SOLN
INTRAMUSCULAR | Status: DC | PRN
  Administered 2012-01-31: 4 mg via INTRAVENOUS

## 2012-01-31 MED ORDER — BUPIVACAINE HCL (PF) 0.25 % IJ SOLN
INTRAMUSCULAR | Status: AC
  Filled 2012-01-31: qty 60

## 2012-01-31 MED ORDER — DABIGATRAN ETEXILATE MESYLATE 150 MG PO CAPS
150.0000 mg | ORAL_CAPSULE | Freq: Two times a day (BID) | ORAL | Status: DC
  Administered 2012-01-31 – 2012-02-01 (×2): 150 mg via ORAL
  Filled 2012-01-31 (×3): qty 1

## 2012-01-31 MED ORDER — LEVOTHYROXINE SODIUM 125 MCG PO TABS
125.0000 ug | ORAL_TABLET | Freq: Every day | ORAL | Status: DC
  Administered 2012-02-01: 125 ug via ORAL
  Filled 2012-01-31: qty 1

## 2012-01-31 MED ORDER — TRAMADOL-ACETAMINOPHEN 37.5-325 MG PO TABS
1.0000 | ORAL_TABLET | Freq: Four times a day (QID) | ORAL | Status: DC | PRN
  Filled 2012-01-31: qty 1

## 2012-01-31 MED ORDER — HYDROCODONE-ACETAMINOPHEN 5-325 MG PO TABS: 1.0000 | ORAL_TABLET | ORAL | Status: DC | PRN

## 2012-01-31 MED ORDER — PROPOFOL 10 MG/ML IV BOLUS
INTRAVENOUS | Status: DC | PRN
  Administered 2012-01-31: 300 mg via INTRAVENOUS

## 2012-01-31 MED ORDER — SODIUM CHLORIDE 0.9 % IJ SOLN
3.0000 mL | Freq: Two times a day (BID) | INTRAMUSCULAR | Status: DC
  Administered 2012-01-31 – 2012-02-01 (×3): 3 mL via INTRAVENOUS

## 2012-01-31 MED ORDER — FENTANYL CITRATE 0.05 MG/ML IJ SOLN: 25.0000 ug | INTRAMUSCULAR | Status: DC | PRN

## 2012-01-31 MED ORDER — HEPARIN SODIUM (PORCINE) 1000 UNIT/ML IJ SOLN
INTRAMUSCULAR | Status: DC | PRN
  Administered 2012-01-31 (×2): 5000 [IU] via INTRAVENOUS

## 2012-01-31 MED ORDER — METOCLOPRAMIDE HCL 5 MG/ML IJ SOLN
10.0000 mg | Freq: Once | INTRAMUSCULAR | Status: AC | PRN
  Filled 2012-01-31: qty 2

## 2012-01-31 MED ORDER — SODIUM CHLORIDE 0.9 % IJ SOLN: 3.0000 mL | INTRAMUSCULAR | Status: DC | PRN

## 2012-01-31 MED ORDER — ALISKIREN FUMARATE 150 MG PO TABS
300.0000 mg | ORAL_TABLET | Freq: Every day | ORAL | Status: DC
  Administered 2012-01-31 – 2012-02-01 (×2): 300 mg via ORAL
  Filled 2012-01-31 (×3): qty 2

## 2012-01-31 MED ORDER — FENTANYL CITRATE 0.05 MG/ML IJ SOLN
INTRAMUSCULAR | Status: DC | PRN
  Administered 2012-01-31 (×4): 25 ug via INTRAVENOUS

## 2012-01-31 MED ORDER — AMLODIPINE BESYLATE 5 MG PO TABS
5.0000 mg | ORAL_TABLET | Freq: Every day | ORAL | Status: DC
  Administered 2012-02-01: 5 mg via ORAL
  Filled 2012-01-31: qty 1

## 2012-01-31 MED ORDER — ZOLPIDEM TARTRATE 5 MG PO TABS: 10.0000 mg | ORAL_TABLET | Freq: Every evening | ORAL | Status: DC | PRN

## 2012-01-31 MED ORDER — DULOXETINE HCL 60 MG PO CPEP
60.0000 mg | ORAL_CAPSULE | Freq: Every day | ORAL | Status: DC
  Administered 2012-01-31 – 2012-02-01 (×2): 60 mg via ORAL
  Filled 2012-01-31 (×2): qty 1

## 2012-01-31 MED ORDER — HEPARIN SODIUM (PORCINE) 1000 UNIT/ML IJ SOLN
INTRAMUSCULAR | Status: AC
  Filled 2012-01-31: qty 1

## 2012-01-31 MED ORDER — ALISKIREN FUMARATE 150 MG PO TABS
300.0000 mg | ORAL_TABLET | Freq: Every day | ORAL | Status: DC
Start: 2012-01-31 — End: 2012-01-31

## 2012-01-31 MED ORDER — ONDANSETRON HCL 4 MG/2ML IJ SOLN
INTRAMUSCULAR | Status: DC | PRN
  Administered 2012-01-31: 4 mg via INTRAVENOUS

## 2012-01-31 MED ORDER — PROTAMINE SULFATE 10 MG/ML IV SOLN
INTRAVENOUS | Status: DC | PRN
  Administered 2012-01-31: 30 mg via INTRAVENOUS

## 2012-01-31 MED ORDER — MIDAZOLAM HCL 5 MG/5ML IJ SOLN
INTRAMUSCULAR | Status: DC | PRN
  Administered 2012-01-31: 2 mg via INTRAVENOUS

## 2012-01-31 MED ORDER — POTASSIUM CHLORIDE CRYS ER 20 MEQ PO TBCR
20.0000 meq | EXTENDED_RELEASE_TABLET | Freq: Two times a day (BID) | ORAL | Status: DC
  Administered 2012-01-31 – 2012-02-01 (×2): 20 meq via ORAL
  Filled 2012-01-31 (×3): qty 1

## 2012-01-31 MED ORDER — SODIUM CHLORIDE 0.9 % IV SOLN
INTRAVENOUS | Status: DC | PRN
  Administered 2012-01-31 (×2): via INTRAVENOUS

## 2012-01-31 MED ORDER — ONDANSETRON HCL 4 MG/2ML IJ SOLN: 4.0000 mg | Freq: Four times a day (QID) | INTRAMUSCULAR | Status: DC | PRN

## 2012-01-31 MED ORDER — ACETAMINOPHEN 325 MG PO TABS: 650.0000 mg | ORAL_TABLET | ORAL | Status: DC | PRN

## 2012-01-31 NOTE — Anesthesia Procedure Notes (Signed)
Procedure Name: LMA Insertion Date/Time: 01/31/2012 8:07 AM Performed by: Margaree Mackintosh Pre-anesthesia Checklist: Patient identified, Patient being monitored, Emergency Drugs available, Timeout performed and Suction available Patient Re-evaluated:Patient Re-evaluated prior to inductionOxygen Delivery Method: Circle system utilized Preoxygenation: Pre-oxygenation with 100% oxygen Intubation Type: IV induction LMA: LMA with gastric port inserted LMA Size: 5.0 Placement Confirmation: positive ETCO2 and breath sounds checked- equal and bilateral Tube secured with: Tape Dental Injury: Teeth and Oropharynx as per pre-operative assessment

## 2012-01-31 NOTE — Care Management Note (Addendum)
    Page 1 of 1   01/31/2012     4:43:51 PM   CARE MANAGEMENT NOTE 01/31/2012  Patient:  [name], [name]   Account Number:  [id number]  Date Initiated:  01/31/2012  Documentation initiated by:  Junius Creamer  Subjective/Objective Assessment:   adm w ablation     Action/Plan:   lives w wife, pcp dr Gerlene Burdock pang   Anticipated DC Date:  02/01/2012   Anticipated DC Plan:  HOME/SELF CARE      DC Planning Services  CM consult      Choice offered to / List presented to:             Status of service:   Medicare Important Message given?   (If response is "NO", the following Medicare IM given date fields will be blank) Date Medicare IM given:   Date Additional Medicare IM given:    Discharge Disposition:  HOME/SELF CARE  Per UR Regulation:  Reviewed for med. necessity/level of care/duration of stay  If discussed at Long Length of Stay Meetings, dates discussed:    Comments:  10/4 12n debbie dowell rn,bsn [phone] was on pradaxa pta, did give pt 10.00 copay card he did not think he had gotten one in past.

## 2012-01-31 NOTE — Interval H&P Note (Signed)
History and Physical Interval Note:  01/31/2012 8:27 AM  [name]  has presented today for surgery, with the diagnosis of afib  The various methods of treatment have been discussed with the patient and family. After consideration of risks, benefits and other options for treatment, the patient has consented to  Procedure(s) (LRB) with comments: ATRIAL FIBRILLATION ABLATION (N/A) as a surgical intervention .  The patient's history has been reviewed, patient examined, no change in status, stable for surgery.  I have reviewed the patient's chart and labs.  Questions were answered to the patient's satisfaction.     [name]

## 2012-01-31 NOTE — Progress Notes (Signed)
Pt on 2900 room 2925. VS stable. HR SR in 70s. Pt drowsy but arousable when spoken to. Right groin is clean dry and intact. Diet heart healthy has been ordered. Wife at bedside. Will continue to monitor.

## 2012-01-31 NOTE — Transfer of Care (Signed)
Immediate Anesthesia Transfer of Care Note  Patient: [name]  Procedure(s) Performed: Procedure(s) (LRB) with comments: ATRIAL FIBRILLATION ABLATION (N/A)  Patient Location: Cath Lab Recovery room  Anesthesia Type: General  Level of Consciousness: sedated and responds to stimulation  Airway & Oxygen Therapy: Patient Spontanous Breathing and Patient connected to nasal cannula oxygen  Post-op Assessment: Report given to PACU RN and Post -op Vital signs reviewed and stable  Post vital signs: Reviewed and stable  Complications: No apparent anesthesia complications

## 2012-01-31 NOTE — Preoperative (Signed)
Beta Blockers   Reason not to administer Beta Blockers:Not Applicable 

## 2012-01-31 NOTE — Anesthesia Preprocedure Evaluation (Addendum)
Anesthesia Evaluation  Patient identified by MRN, date of birth, ID band Patient awake    Reviewed: Allergy & Precautions, H&P , NPO status , Patient's Chart, lab work & pertinent test results  History of Anesthesia Complications Negative for: history of anesthetic complications  Airway Mallampati: II TM Distance: >3 FB Neck ROM: Full    Dental  (+) Teeth Intact and Dental Advisory Given   Pulmonary asthma , sleep apnea ,    Pulmonary exam normal       Cardiovascular hypertension, Pt. on medications + dysrhythmias Atrial Fibrillation + Valvular Problems/Murmurs AS Rhythm:Irregular  EF 50% on 2D echo Mild to moderate aortic stenosis, midly dilated left atrium   Neuro/Psych negative neurological ROS  negative psych ROS   GI/Hepatic negative GI ROS, Neg liver ROS,   Endo/Other  Hypothyroidism   Renal/GU negative Renal ROS     Musculoskeletal  (+) Fibromyalgia -  Abdominal Normal abdominal exam  (+)   Peds  Hematology negative hematology ROS (+)   Anesthesia Other Findings   Reproductive/Obstetrics                          Anesthesia Physical Anesthesia Plan  ASA: III  Anesthesia Plan: MAC and General   Post-op Pain Management:    Induction: Intravenous  Airway Management Planned: Simple Face Mask and LMA  Additional Equipment:   Intra-op Plan:   Post-operative Plan: Extubation in OR  Informed Consent:   Plan Discussed with:   Anesthesia Plan Comments:         Anesthesia Quick Evaluation

## 2012-01-31 NOTE — Anesthesia Postprocedure Evaluation (Signed)
Anesthesia Post Note  Patient: [name]  Procedure(s) Performed: Procedure(s) (LRB): ATRIAL FIBRILLATION ABLATION (N/A)  Anesthesia type: General  Patient location: PACU  Post pain: Pain level controlled  Post assessment: Patient's Cardiovascular Status Stable  Last Vitals:  Filed Vitals:   01/31/12 1055  BP:   Pulse: 75  Temp:   Resp:     Post vital signs: Reviewed and stable  Level of consciousness: alert  Complications: No apparent anesthesia complications

## 2012-01-31 NOTE — H&P (View-Only) (Signed)
 Primary Care Physician: PANG,RICHARD, MD Referring Physician:  Dr Ganji   [name] is a 61 y.o. male with a h/o longstanding persistent atrial fibrillation who presents today for EP following. He was originally diagnosed with atrial fibrillation in 2004.  He presented for back surgery and was found to have Afib on routine preoperative ekg.  He was cardioverted at that time to sinus rhythm.  He eventually returned to afib.  He was evaluated by Dr Ganji and placed on flecainide.  He underwent repeat cardioversion 04/2010 which was unsuccessful.  His flecainide was therefore stopped.  He has been in afib at least since January of 2012.  He has had progressive fatigue and decreased exercise tolerance since that time.  He reports that his legs are "tired".  Presently, he reports that he is "exhausted" when he gets home in the afternoon from work.   He had a sleep study 2010 which revealed mild sleep apnea.  This has been repeated and reveals stable mild OSA.     Since last being seen in our clinic, he continues to have fatigue.  This did not improve off of beta blockers.    Today, he denies symptoms of palpitations, chest pain,  lower extremity edema, dizziness, presyncope, syncope, or neurologic sequela.  He is primarily limited by arthritis, back pain, and leg pain.  The patient is tolerating medications without difficulties and is otherwise without complaint today.   Past Medical History  Diagnosis Date  . Atrial fibrillation     longstanding persistent  . Hypertension   . Hypothyroidism   . Hyperlipidemia   . Obstructive sleep apnea     mild by sleep study 2013  . Fibromyalgia   . Chronic back pain   . DJD (degenerative joint disease)   . Obesity   . Mitral regurgitation     trivial  . Aortic stenosis, mild   . Left atrial enlargement     LA size 45mm by echo 11/21/11   Past Surgical History  Procedure Date  . Back surgery     X2  . Hand surgery   . Us echocardiography  08/07/2009    EF 55-60%  . Doppler echocardiography 03/11/2002    EF 70-75%  . Cardiovascular stress test 03/12/2002    EF 48%, NO EVIDENCE OF ISCHEMIA    Current Outpatient Prescriptions  Medication Sig Dispense Refill  . aliskiren (TEKTURNA) 150 MG tablet Take 150 mg by mouth daily.      . amLODipine (NORVASC) 5 MG tablet Take 5 mg by mouth daily.      . aspirin 81 MG tablet Take 81 mg by mouth daily.      . Azilsartan-Chlorthalidone 40-25 MG TABS Take 1 tablet by mouth every morning.      . Cholecalciferol (VITAMIN D3) 3000 UNITS TABS Take 1 tablet by mouth daily.       . cyclobenzaprine (FLEXERIL) 10 MG tablet Take 1 tablet by mouth as needed.      . dabigatran (PRADAXA) 150 MG CAPS Take 150 mg by mouth every 12 (twelve) hours.      . diltiazem (CARDIZEM CD) 120 MG 24 hr capsule Take 1 capsule (120 mg total) by mouth daily.  90 capsule  3  . DULoxetine (CYMBALTA) 60 MG capsule Take 60 mg by mouth daily.      . fish oil-omega-3 fatty acids 1000 MG capsule Take 1,200 mg by mouth as needed.       . furosemide (LASIX)   20 MG tablet Take 20 mg by mouth as needed.       . levothyroxine (SYNTHROID, LEVOTHROID) 137 MCG tablet Take 137 mcg by mouth daily.      . Melatonin 3 MG CAPS Take 1 capsule by mouth at bedtime.      . meloxicam (MOBIC) 15 MG tablet Take 15 mg by mouth daily.      . pravastatin (PRAVACHOL) 80 MG tablet Take 1 tablet by mouth Daily.      . traMADol-acetaminophen (ULTRACET) 37.5-325 MG per tablet PRN      . zolpidem (AMBIEN) 10 MG tablet Take 10 mg by mouth at bedtime as needed.        No Known Allergies  History   Social History  . Marital Status: Married    Spouse Name: N/A    Number of Children: N/A  . Years of Education: N/A   Occupational History  . Not on file.   Social History Main Topics  . Smoking status: Former Smoker    Quit date: 07/18/1991  . Smokeless tobacco: Not on file  . Alcohol Use: No  . Drug Use: No  . Sexually Active:    Other  Topics Concern  . Not on file   Social History Narrative   Lives in [place] with spouse.  Works as a maintenance technician.    Family History  Problem Relation Age of Onset  . Heart failure Mother   . Stroke Father     ROS- All systems are reviewed and negative except as per the HPI above  Physical Exam: Filed Vitals:   01/16/12 1632  BP: 143/82  Pulse: 58  Height: 5' 11" (1.803 m)  Weight: 234 lb (106.142 kg)    GEN- The patient is well appearing, alert and oriented x 3 today.   Head- normocephalic, atraumatic Eyes-  Sclera clear, conjunctiva pink Ears- hearing intact Oropharynx- clear Neck- supple, no JVP Lymph- no cervical lymphadenopathy Lungs- Clear to ausculation bilaterally, normal work of breathing Heart- irregular rate and rhythm, 2/6 SEM LLSB and apex GI- soft, NT, ND, + BS Extremities- no clubbing, cyanosis, +1 edema MS- no significant deformity or atrophy Skin- no rash or lesion Psych- euthymic mood, full affect Neuro- strength and sensation are intact  Echo and sleep study are reviewed  Assessment and Plan:  

## 2012-01-31 NOTE — Op Note (Signed)
SURGEON:  [name], MD  PREPROCEDURE DIAGNOSES: 1. Persistent atrial fibrillation.  POSTPROCEDURE DIAGNOSES: 1. Persistent atrial fibrillation.  PROCEDURES: 1. Comprehensive electrophysiologic study. 2. Coronary sinus pacing and recording. 3. Three-dimensional mapping of atrial fibrillation 4. Ablation of atrial fibrillation 5. Intracardiac echocardiography. 6. Transseptal puncture of an intact septum. 7.  Rotational Angiography with processing at an independent workstation 8. Arrhythmia induction with pacing 9. External cardioversion.  INTRODUCTION:  [name] is a 61 y.o. male with a history of persistent atrial fibrillation who now presents for EP study and radiofrequency ablation.  The patient reports initially being diagnosed with atrial fibrillation in 2004 after presenting with symptomatic palpitations and fatgiue.  The patient has failed medical therapy and has failed cardioversion.  The patient therefore presents today for catheter ablation of atrial fibrillation.  DESCRIPTION OF PROCEDURE:  Informed written consent was obtained, and the patient was brought to the electrophysiology lab in a fasting state.  The patient was adequately sedated with intravenous medications as outlined in the anesthesia report.  The patient's left and right groins were prepped and draped in the usual sterile fashion by the EP lab staff.  Using a percutaneous Seldinger technique, two 7-French and one 11-French hemostasis sheaths were placed into the right common femoral vein.   3 Dimensional Rotational Angiography: A 5 french pigtail catheter was introduced through the right common femoral vein and advanced into the inferior venocava.  3 demential rotational angiography was then performed by power injection of 100cc of nonionic contrast.  Reprocessing at an independent work station was then performed.   This demonstrated a moderate sized left atrium with 4 separate pulmonary veins.  There was a  short common ostium to the left superior and inferior veins.   A 3 dimensional rendering of the left atrium was then merged using [employer] onto the WellPoint system and registered with intracardiac echo (see below).  The pigtail catheter was then removed.  Catheter Placement:  A 7-French Biosense Webster Decapolar coronary sinus catheter was introduced through the right common femoral vein and advanced into the coronary sinus for recording and pacing from this location.  A 6-French quadripolar Josephson catheter was introduced through the right common femoral vein and advanced into the right ventricle for recording and pacing.  This catheter was then pulled back to the His bundle location.    Initial Measurements: The patient presented to the electrophysiology lab in atrial fibrillation with a low ventricular escape.   The HV interval 65 msec.     Intracardiac Echocardiography: A 10-French Biosense Webster AcuNav intracardiac echocardiography catheter was introduced through the left common femoral vein and advanced into the right atrium. Intracardiac echocardiography was performed of the left atrium, and a three-dimensional anatomical rendering of the left atrium was performed using CARTO sound technology.  The patient was noted to have a moderate to large sized left atrium.  The interatrial septum was prominent but not aneurysmal. All 4 pulmonary veins were visualized.  There was a short common segment between the left pulmonary veins.  The right PVs were noted to have separate ostia.  The pulmonary veins were moderate in size.  The left atrial appendage was visualized and did not reveal thrombus.   There was no evidence of pulmonary vein stenosis.   Transseptal Puncture: The middle right common femoral vein sheath was exchanged for an 8.5 [place] SL2 transseptal sheath and transseptal access was achieved in a standard fashion using a Brockenbrough needle under biplane fluoroscopy with  intracardiac echocardiography confirmation of the transseptal puncture.  Once transseptal access had been achieved, heparin was administered intravenously and intra- arterially in order to maintain an ACT of greater than 300 seconds throughout the procedure.   3D Mapping and Ablation: The His bundle catheter was removed and in its place a 3.5 mm Biosense Webster EZ [employer] ablation catheter was advanced into the right atrium.  The transseptal sheath was pulled back into the IVC over a guidewire.  The ablation catheter was advanced across the transseptal hole using the wire as a guide.  The transseptal sheath was then re-advanced over the guidewire into the left atrium.  A duodecapolar Biosense Webster circular mapping catheter was introduced through the transseptal sheath and positioned over the mouth of all 4 pulmonary veins.  Three-dimensional electroanatomical mapping was performed using CARTO technology.  This demonstrated electrical activity within all four pulmonary veins at baseline. The patient underwent successful sequential electrical isolation and anatomical encircling of all four pulmonary veins using radiofrequency current with a circular mapping catheter as a guide.  Complex fractionated electrograms (CFAEs) were then identified and ablated along the roof of the left atrium, base of the left atrial appendage (LAA),  along the ridge between the left superior pulmonary vein and the left atrial appendage, the interatrial septum, along the lateral wall of the left atrium, and above the coronary sinus.    Cardioversion: The patient was then cardioverted to sinus rhythm with a single synchronized 360-J biphasic shock with cardioversion electrodes in the anterior-posterior thoracic configuration. He maintained sinus rhythm initially but then had recurence of afib with catheter manipulation within the left atrial, requiring repeat cardioversion with 360J biphasic.  He remained in sinus rhythm  thereafter.  Measurements Following Ablation: In sinus rhythm with RR interval was 922, with PR , QRS 108 msec, and Qtc 490 msec.  Following ablation the AH interval measured with an HV interval of 56 msec. Ventricular pacing was performed, which revealed VA dissociated at 700 msec.    Rapid atrial pacing was performed, which revealed an AV Wenckebach cycle length of 800 msec.  Electroisolation was then again confirmed in all four pulmonary veins.  Intracardiac echocardiography was again performed, which revealed no pericardial effusion.  The procedure was therefore considered completed.  All catheters were removed, and the sheaths were aspirated and flushed.  The patient was transferred to the recovery area for sheath removal per protocol.  A limited bedside transthoracic echocardiogram revealed no pericardial effusion.  There were no early apparent complications.  CONCLUSIONS: 1. Atrial fibrillation upon presentation.   2. Rotational Angiography reveals a moderate sized left atrium with four pulmonary veins and a short common segement to the left PVs. 3. Successful electrical isolation and anatomical encircling of all four pulmonary veins with radiofrequency current. 3. CFAEs were identified and ablated along the roof of the left atrium, base of the left atrial appendage (LAA),  along the ridge between the left superior pulmonary vein and the left atrial appendage, the interatrial septum, along the lateral wall of the left atrium, and above the coronary sinus.   4. Atrial fibrillation successfully cardioverted to sinus rhythm. 5. Slow AV nodal conduction with no infrahisian block observed 6. No early apparent complications.   James Allred,MD 10:40 AM 01/31/2012

## 2012-02-01 ENCOUNTER — Encounter (HOSPITAL_COMMUNITY): Payer: Self-pay | Admitting: Cardiology

## 2012-02-01 DIAGNOSIS — I440 Atrioventricular block, first degree: Secondary | ICD-10-CM

## 2012-02-01 DIAGNOSIS — I4891 Unspecified atrial fibrillation: Secondary | ICD-10-CM

## 2012-02-01 MED ORDER — PANTOPRAZOLE SODIUM 40 MG PO TBEC: 40.0000 mg | DELAYED_RELEASE_TABLET | Freq: Every day | ORAL | Status: DC

## 2012-02-01 NOTE — Progress Notes (Signed)
   Subjective:   61 yo male followed by Dr. Jacinto Halim with longstanding AF.  Underwent successful AF ablation yesterday. Doing well. Has had long PR interval post ablation (~471ms) with some Wenckebach but asymptomatic.   Feels good. Eager to go home.   Intake/Output Summary (Last 24 hours) at 02/01/12 1120 Last data filed at 02/01/12 0500  Gross per 24 hour  Intake    120 ml  Output   1100 ml  Net   -980 ml    Current meds:    . aliskiren  300 mg Oral Daily  . amLODipine  5 mg Oral Daily  . dabigatran  150 mg Oral Q12H  . DULoxetine  60 mg Oral Daily  . levothyroxine  125 mcg Oral Daily  . potassium chloride SA  20 mEq Oral BID  . sodium chloride  3 mL Intravenous Q12H  . DISCONTD: aliskiren  300 mg Oral Daily   Infusions:     Objective:  Blood pressure 158/97, pulse 95, temperature 98.5 F (36.9 C), temperature source Oral, resp. rate 16, weight 107.049 kg (236 lb), SpO2 97.00%. Weight change:   Physical Exam: General:  Well appearing. No resp difficulty HEENT: normal Neck: supple. thick . Carotids 2+ bilat; no bruits. No lymphadenopathy or thryomegaly appreciated. Cor: PMI nondisplaced. Regular rate & rhythm. No rubs. 2/6 SEM lsb Lungs: clear Abdomen: soft, obese nontender, nondistended.  Good bowel sounds. Extremities: no cyanosis, clubbing, rash, edema. Groin site ok Neuro: alert & orientedx3, cranial nerves grossly intact. moves all 4 extremities w/o difficulty. Affect pleasant  Telemetry: SR with long PR  ECG SR 95 with long PR (440 ms)  Lab Results: Basic Metabolic Panel:  Lab 01/31/12 4098 01/28/12 1559  NA 137 138  K 3.7 3.0*  CL 96 97  CO2 31 32  GLUCOSE 110* 151*  BUN 24* 28*  CREATININE 0.93 1.3  CALCIUM 10.0 9.6  MG -- --  PHOS -- --   Liver Function Tests: No results found for this basename: AST:5,ALT:5,ALKPHOS:5,BILITOT:5,PROT:5,ALBUMIN:5 in the last 168 hours No results found for this basename: LIPASE:5,AMYLASE:5 in the last 168  hours No results found for this basename: AMMONIA:5 in the last 168 hours CBC: No results found for this basename: WBC:5,NEUTROABS:5,HGB:5,HCT:5,MCV:5,PLT:5 in the last 168 hours Cardiac Enzymes: No results found for this basename: CKTOTAL:5,CKMB:5,CKMBINDEX:5,TROPONINI:5 in the last 168 hours BNP: No components found with this basename: POCBNP:5 CBG: No results found for this basename: GLUCAP:5 in the last 168 hours Microbiology: No results found for this basename: cult   No results found for this basename: CULT:2,SDES:2 in the last 168 hours  Imaging: No results found.   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 Does have marked 1 AVB. Discussed the fact that he may need PPM in future but currently stable. D/w Dr. Johney Frame. He is stable for D/C. Will plan 48 hour monitor on d/c, F/u Dr. Johney Frame in near future.   Reuel Boom Bensimhon,MD 11:28 AM     LOS: 1 day    Arvilla Meres, MD 02/01/2012, 11:20 AM

## 2012-02-01 NOTE — Discharge Summary (Signed)
Discharge Summary   Patient ID: [name] MRN: [id number], DOB/AGE: [birth year] 61 y.o.  Primary MD: Juline Patch, MD Primary Cardiologist: Dr. Jacinto Halim Primary Electrophysiologist: Dr. Johney Frame  Admit date: 01/31/2012 D/C date:     02/01/2012      Primary Discharge Diagnoses:  1. Symptomatic Persistent Atrial Fibrillation  - successful DCCV '04, placed on flecainide, failed DCCV 04/2010, flecainide stopped  - s/p successful A.fib ablation 01/31/12  - Stop diltiazem, cont Pradaxa  2. Asymptomatic 1st degree AV Block (~431ms)  - 48hr Holter Monitor & f/u w/ Dr. Johney Frame next week   Secondary Discharge Diagnoses:  . Hypertension   . Hypothyroidism   . Hyperlipidemia   . Fibromyalgia   . Chronic back pain   . DJD (degenerative joint disease)   . Obesity   . Mitral regurgitation     trivial  . Aortic stenosis, mild   . Left atrial enlargement     LA size 45mm by echo 11/21/11  . Obstructive sleep apnea     mild by sleep study 2013  . Asthma      Allergies Allergies  Allergen Reactions  . Meloxicam Rash    Diagnostic Studies/Procedures:   01/31/12 - Radiofrequency Catheter Ablation PROCEDURES:  1. Comprehensive electrophysiologic study.  2. Coronary sinus pacing and recording.  3. Three-dimensional mapping of atrial fibrillation  4. Ablation of atrial fibrillation  5. Intracardiac echocardiography.  6. Transseptal puncture of an intact septum.  7. Rotational Angiography with processing at an independent workstation  8. Arrhythmia induction with pacing  9. External cardioversion. CONCLUSIONS:  1. Atrial fibrillation upon presentation.  2. Rotational Angiography reveals a moderate sized left atrium with four pulmonary veins and a short common segement to the left PVs.  3. Successful electrical isolation and anatomical encircling of all four pulmonary veins with radiofrequency current.  3. CFAEs were identified and ablated along the roof of the left atrium, base of  the left atrial appendage (LAA), along the ridge between the left superior pulmonary vein and the left atrial appendage, the interatrial septum, along the lateral wall of the left atrium, and above the coronary sinus.  4. Atrial fibrillation successfully cardioverted to sinus rhythm.  5. Slow AV nodal conduction with no infrahisian block observed  6. No early apparent complications.   History of Present Illness: 61 y.o. male w/ the above medical problems who presented to [facility] on 01/30/12 for radiofrequency catheter ablation of symptomatic persistent atrial fibrillation. He was diagnosed with a.fib 2004 at which time successful DCCV was performed. He eventually returned to a.fib and was placed on flecainide. Underwent DCCV 04/2010 which was unsuccessful and therefore flecainide was stopped. He has been in a.fib since that time and reported progressive fatigue and decreased exercise tolerance.  He was referred by Dr. Jacinto Halim to Dr. Johney Frame who recommended catheter ablation. TEE was performed 01/30/12 showing nl LV fxn and no thrombus.   Hospital Course: He presented to [facility] on 10/4 in stable condition. He underwent successful radiofrequency catheter ablation of a.fib. He tolerated the procedure well without complications. Groin sites were stable. He remained in sinus rhythm, but was noted to have long PR interval (~422ms) with some Wenckebach. He remained asymptomatic. He will stop his Diltiazem and have a 48hr holter monitor placed on Monday then follow up with Dr. Johney Frame to discuss possible PPM. He will continue Pradaxa and take a PPI for 6 weeks. He was seen and evaluated by Dr. Gala Romney who felt she was stable  for discharge home with plans for follow up as scheduled below.   Discharge Vitals: Blood pressure 158/97, pulse 95, temperature 98 F (36.7 C), temperature source Oral, resp. rate 18, weight 236 lb (107.049 kg), SpO2 99.00%.  Labs:   Lab 01/31/12 0550  NA 137  K 3.7  CL 96   CO2 31  BUN 24*  CREATININE 0.93  CALCIUM 10.0  GLUCOSE 110*    Discharge Medications     Medication List     As of 02/01/2012  1:06 PM    STOP taking these medications         diltiazem 120 MG 24 hr capsule   Commonly known as: CARDIZEM CD      meloxicam 15 MG tablet   Commonly known as: MOBIC      TAKE these medications         albuterol 108 (90 BASE) MCG/ACT inhaler   Commonly known as: PROVENTIL HFA;VENTOLIN HFA   Inhale 2 puffs into the lungs every 6 (six) hours as needed. For shortness of breath.      aliskiren 300 MG tablet   Commonly known as: TEKTURNA   Take 300 mg by mouth daily.      amLODipine 5 MG tablet   Commonly known as: NORVASC   Take 5 mg by mouth daily.      aspirin EC 81 MG tablet   Take 81 mg by mouth daily.      Azilsartan-Chlorthalidone 40-25 MG Tabs   Take 1 tablet by mouth every morning.      cyclobenzaprine 10 MG tablet   Commonly known as: FLEXERIL   Take 10 mg by mouth 2 (two) times daily as needed. For muscle spasms.      dabigatran 150 MG Caps   Commonly known as: PRADAXA   Take 150 mg by mouth every 12 (twelve) hours.      DULoxetine 60 MG capsule   Commonly known as: CYMBALTA   Take 60 mg by mouth daily.      furosemide 20 MG tablet   Commonly known as: LASIX   Take 20 mg by mouth daily as needed. For fluid retention      levothyroxine 125 MCG tablet   Commonly known as: SYNTHROID, LEVOTHROID   Take 125 mcg by mouth daily.      Melatonin 3 MG Caps   Take 1-2 capsules by mouth at bedtime as needed. For insomnia.      pantoprazole 40 MG tablet   Commonly known as: PROTONIX   Take 1 tablet (40 mg total) by mouth daily.      potassium chloride SA 20 MEQ tablet   Commonly known as: K-DUR,KLOR-CON   Take 1 tablet (20 mEq total) by mouth 2 (two) times daily.      pravastatin 80 MG tablet   Commonly known as: PRAVACHOL   Take 80 mg by mouth daily.      traMADol-acetaminophen 37.5-325 MG per tablet   Commonly  known as: ULTRACET   Take 1 tablet by mouth every 6 (six) hours as needed. For pain      Vitamin D3 3000 UNITS Tabs   Take 1 tablet by mouth daily.      zolpidem 10 MG tablet   Commonly known as: AMBIEN   Take 10 mg by mouth at bedtime as needed. For sleep          Disposition   Discharge Orders    Future Orders Please Complete By Expires  Diet - low sodium heart healthy      Increase activity slowly      Discharge instructions      Comments:   * KEEP GROIN SITES CLEAN AND DRY. Call the office for any signs of bleedings, pus, swelling, increased pain, or any other concerns. * NO HEAVY LIFTING (>10lbs) OR SEXUAL ACTIVITY X 7 DAYS. * NO DRIVING X 2-3 DAYS. * NO SOAKING BATHS, HOT TUBS, POOLS, ETC., X 7 DAYS.  * Please take all medications as prescribed and bring with you to your office visits.  * You will take Protonix for 6wks after your ablation.  * You will need to wear a heart monitor for 48hrs starting Monday. Our office will contact you.     Follow-up Information    Follow up with [facility]. On 02/03/2012. (48hr Heart Monitor; Our office will call you with a time to pick up your monitor)    Contact information:   [address] [place] [address] [phone]       Follow up with [name], MD. In 1 week. (Our office will call you with your appointment date/time)    Contact information:   [facility] [address], [address] [place] [address] [phone]       Follow up with Pamella Pert, MD. (As scheduled)    Contact information:   [address]  [place] [address] [phone]           Outstanding Labs/Studies:  1. 48hr Holter Monitor  Duration of Discharge Encounter: Greater than 30 minutes including physician and PA time.  Signed, HOPE, JESSICA PA-C 02/01/2012, 1:06 PM

## 2012-02-01 NOTE — Progress Notes (Signed)
D/C instructions reviewed with pt and his wife. Copy of instructions provided. No written scripts, faxed in to pt's pharmacy per MD. Pt instructed on incision care. Education provided.  Pt d/c'd via wheelchair with belongings and wife. Escorted by unit RN.

## 2012-02-02 NOTE — Discharge Summary (Signed)
Agree with above. See my rounding note for full details.  Daniel Bensimhon,MD 1:31 PM   

## 2012-02-06 ENCOUNTER — Encounter (INDEPENDENT_AMBULATORY_CARE_PROVIDER_SITE_OTHER): Payer: BC Managed Care – PPO

## 2012-02-06 DIAGNOSIS — I4891 Unspecified atrial fibrillation: Secondary | ICD-10-CM

## 2012-02-14 ENCOUNTER — Ambulatory Visit (INDEPENDENT_AMBULATORY_CARE_PROVIDER_SITE_OTHER): Payer: BC Managed Care – PPO | Admitting: Internal Medicine

## 2012-02-14 ENCOUNTER — Encounter: Payer: Self-pay | Admitting: Internal Medicine

## 2012-02-14 VITALS — BP 148/84 | HR 81 | Ht 71.0 in | Wt 236.1 lb

## 2012-02-14 DIAGNOSIS — I4891 Unspecified atrial fibrillation: Secondary | ICD-10-CM

## 2012-02-14 DIAGNOSIS — I440 Atrioventricular block, first degree: Secondary | ICD-10-CM

## 2012-02-14 NOTE — Patient Instructions (Signed)
Your physician wants you to follow-up in: 12 weeks with Dr Jacquiline Doe will receive a reminder letter in the mail two months in advance. If you don't receive a letter, please call our office to schedule the follow-up appointment.   Come next Wed morning before 12:00 for an EKG

## 2012-02-16 ENCOUNTER — Encounter: Payer: Self-pay | Admitting: Internal Medicine

## 2012-02-16 NOTE — Assessment & Plan Note (Signed)
Long first degree AV block ( ) post ablation (once in sinus). I do not believe that this was a result of his ablation but is more likely a chronic issue He may require a pacemaker at some point. Given absence of symptoms, we will not plan to proceed with PPM at this time.

## 2012-02-16 NOTE — Progress Notes (Signed)
PCP: Juline Patch, MD Primary Cardiologist:  [name] is a 61 y.o. male who presents today for electrophysiology followup.  Since his afib ablation, the patient reports doing very well.   He had prolonged PR interval (400 msec) post ablation with wenckebach block.  He presents today for early follow-up.  He denies complications post ablation.  He is unaware of any afib.  Today, he denies symptoms of palpitations, chest pain, shortness of breath,  lower extremity edema, dizziness, presyncope, or syncope.  The patient is otherwise without complaint today.   Past Medical History  Diagnosis Date  . Atrial fibrillation     dx '04; DCCV '04, placed on flecainide, failed DCCV 04/2010, flecainide stopped; s/p successful A.fib ablation 01/31/12  . Hypertension   . Hypothyroidism   . Hyperlipidemia   . Fibromyalgia   . Chronic back pain   . DJD (degenerative joint disease)   . Obesity   . Mitral regurgitation     trivial  . Aortic stenosis, mild   . Left atrial enlargement     LA size 45mm by echo 11/21/11  . Obstructive sleep apnea     mild by sleep study 2013  . Asthma   . First degree AV block     post ablation, ~431ms   Past Surgical History  Procedure Date  . Hand surgery     right  . US echocardiography 08/07/2009    EF 55-60%  . Doppler echocardiography 03/11/2002    EF 70-75%  . Cardiovascular stress test 03/12/2002    EF 48%, NO EVIDENCE OF ISCHEMIA  . Back surgery     X2, lumbar.  [name] without cardioversion 01/30/2012    Procedure: TRANSESOPHAGEAL ECHOCARDIOGRAM (TEE);  Surgeon: Laurey Morale, MD;  Location: [facility] ENDOSCOPY;  Service: Cardiovascular;  Laterality: N/A;  ablation next day  . Atrial fibrillation ablation 01/30/12    PVI by [name]    Current Outpatient Prescriptions  Medication Sig Dispense Refill  . albuterol (PROVENTIL HFA;VENTOLIN HFA) 108 (90 BASE) MCG/ACT inhaler Inhale 2 puffs into the lungs every 6 (six) hours as needed. For shortness  of breath.      [name] aliskiren (TEKTURNA) 300 MG tablet Take 300 mg by mouth daily.      [name] amLODipine (NORVASC) 5 MG tablet Take 5 mg by mouth daily.      [name] aspirin EC 81 MG tablet Take 81 mg by mouth daily.      . Azilsartan-Chlorthalidone 40-25 MG TABS Take 1 tablet by mouth every morning.      . Cholecalciferol (VITAMIN D3) 3000 UNITS TABS Take 1 tablet by mouth daily.       . cyclobenzaprine (FLEXERIL) 10 MG tablet Take 10 mg by mouth 2 (two) times daily as needed. For muscle spasms.      . dabigatran (PRADAXA) 150 MG CAPS Take 150 mg by mouth every 12 (twelve) hours.      . DULoxetine (CYMBALTA) 60 MG capsule Take 60 mg by mouth daily.      . fish oil-omega-3 fatty acids 1000 MG capsule Take 2 g by mouth daily.      . furosemide (LASIX) 20 MG tablet Take 20 mg by mouth daily as needed. For fluid retention      . levothyroxine (SYNTHROID, LEVOTHROID) 125 MCG tablet Take 125 mcg by mouth daily.      . Melatonin 3 MG CAPS Take 1-2 capsules by mouth at bedtime as needed. For insomnia.      [name]  pantoprazole (PROTONIX) 40 MG tablet Take 1 tablet (40 mg total) by mouth daily.  30 tablet  1  . potassium chloride SA (KLOR-CON M20) 20 MEQ tablet Take 1 tablet (20 mEq total) by mouth 2 (two) times daily.  60 tablet  3  . pravastatin (PRAVACHOL) 80 MG tablet Take 80 mg by mouth daily.      . traMADol-acetaminophen (ULTRACET) 37.5-325 MG per tablet Take 1 tablet by mouth every 6 (six) hours as needed. For pain      . zolpidem (AMBIEN) 10 MG tablet Take 10 mg by mouth at bedtime as needed. For sleep        Physical Exam: Filed Vitals:   02/14/12 1344  BP: 148/84  Pulse: 81  Height: 5\' 11"  (1.803 m)  Weight: 236 lb 1.9 oz (107.103 kg)    GEN- The patient is well appearing, alert and oriented x 3 today.   Head- normocephalic, atraumatic Eyes-  Sclera clear, conjunctiva pink Ears- hearing intact Oropharynx- clear Lungs- Clear to ausculation bilaterally, normal work of breathing Heart- irregular  rate and rhythm, no murmurs, rubs or gallops, PMI not laterally displaced GI- soft, NT, ND, + BS Extremities- no clubbing, cyanosis, or edema  ekg today reveals afib, V rate 81 bpm, nonspecific ST/T changes holter monitor reveals afib  Assessment and Plan:

## 2012-02-16 NOTE — Assessment & Plan Note (Signed)
ekg reveals ERAF post ablation He is asymptomatic No changes today Return for EKG in 1 week If still in afib, I will likely initiate amiodarone and subsequently cardiovert

## 2012-02-19 ENCOUNTER — Encounter: Payer: Self-pay | Admitting: *Deleted

## 2012-02-19 ENCOUNTER — Other Ambulatory Visit: Payer: Self-pay | Admitting: *Deleted

## 2012-02-19 ENCOUNTER — Ambulatory Visit (INDEPENDENT_AMBULATORY_CARE_PROVIDER_SITE_OTHER): Payer: BC Managed Care – PPO | Admitting: *Deleted

## 2012-02-19 VITALS — BP 146/90 | HR 79 | Ht 71.0 in | Wt 237.0 lb

## 2012-02-19 DIAGNOSIS — Z79899 Other long term (current) drug therapy: Secondary | ICD-10-CM

## 2012-02-19 DIAGNOSIS — I4891 Unspecified atrial fibrillation: Secondary | ICD-10-CM

## 2012-02-19 LAB — CBC WITH DIFFERENTIAL/PLATELET
Basophils Absolute: 0 10*3/uL (ref 0.0–0.1)
Basophils Relative: 0.3 % (ref 0.0–3.0)
Eosinophils Absolute: 0.1 10*3/uL (ref 0.0–0.7)
Eosinophils Relative: 1.6 % (ref 0.0–5.0)
HCT: 43.3 % (ref 39.0–52.0)
Hemoglobin: 14.3 g/dL (ref 13.0–17.0)
Lymphocytes Relative: 15.7 % (ref 12.0–46.0)
Lymphs Abs: 1.2 10*3/uL (ref 0.7–4.0)
MCHC: 33 g/dL (ref 30.0–36.0)
MCV: 91.5 fl (ref 78.0–100.0)
Monocytes Absolute: 0.5 10*3/uL (ref 0.1–1.0)
Monocytes Relative: 7.3 % (ref 3.0–12.0)
Neutro Abs: 5.7 10*3/uL (ref 1.4–7.7)
Neutrophils Relative %: 75.1 % (ref 43.0–77.0)
Platelets: 165 10*3/uL (ref 150.0–400.0)
RBC: 4.73 Mil/uL (ref 4.22–5.81)
RDW: 13.3 % (ref 11.5–14.6)
WBC: 7.5 10*3/uL (ref 4.5–10.5)

## 2012-02-19 LAB — BASIC METABOLIC PANEL
BUN: 19 mg/dL (ref 6–23)
CO2: 30 mEq/L (ref 19–32)
Calcium: 8.9 mg/dL (ref 8.4–10.5)
Chloride: 95 mEq/L — ABNORMAL LOW (ref 96–112)
Creatinine, Ser: 1.2 mg/dL (ref 0.4–1.5)
GFR: 66.32 mL/min (ref >60.00)
Glucose, Bld: 143 mg/dL — ABNORMAL HIGH (ref 70–99)
Potassium: 2.7 mEq/L — CL (ref 3.5–5.1)
Sodium: 137 mEq/L (ref 135–145)

## 2012-02-19 LAB — MAGNESIUM: Magnesium: 2 mg/dL (ref 1.5–2.5)

## 2012-02-19 NOTE — Progress Notes (Signed)
Pt. In for an EKG, perform by nurse read per Dr Johney Frame MD A-fib rate 79 beats per minute. BP 146/90 left arm. Pt took all his medications this AM.  Pt is scheduled for cardioversion on 02/25/12 around 10:30 AM with Dr. Johney Frame MD in the EP lab. Instructions given to pt, labs done today.

## 2012-02-19 NOTE — Patient Instructions (Signed)
Pt is scheduled for Cardioversion in the EP lab on 02/25/12 at 10:30 AM . A letter with  instructions given to pt Your physician recommends that you return for lab work in: today BMET,CBC, Magnesium.

## 2012-02-21 ENCOUNTER — Encounter (HOSPITAL_COMMUNITY): Payer: Self-pay | Admitting: Pharmacy Technician

## 2012-02-24 ENCOUNTER — Encounter: Payer: Self-pay | Admitting: *Deleted

## 2012-02-24 NOTE — Telephone Encounter (Signed)
This encounter was created in error - please disregard.

## 2012-02-25 ENCOUNTER — Ambulatory Visit (HOSPITAL_COMMUNITY)
Admission: RE | Admit: 2012-02-25 | Discharge: 2012-02-25 | Disposition: A | Discharge status: Home / Self Care | Payer: BC Managed Care – PPO | Source: Ambulatory Visit | Attending: Internal Medicine | Admitting: Internal Medicine

## 2012-02-25 ENCOUNTER — Encounter (HOSPITAL_COMMUNITY): Payer: Self-pay | Admitting: Anesthesiology

## 2012-02-25 ENCOUNTER — Ambulatory Visit (HOSPITAL_COMMUNITY): Payer: BC Managed Care – PPO | Admitting: Anesthesiology

## 2012-02-25 ENCOUNTER — Encounter (HOSPITAL_COMMUNITY)
Admission: RE | Disposition: A | Discharge status: Home / Self Care | Payer: Self-pay | Source: Ambulatory Visit | Attending: Internal Medicine

## 2012-02-25 DIAGNOSIS — I4891 Unspecified atrial fibrillation: Secondary | ICD-10-CM

## 2012-02-25 DIAGNOSIS — I10 Essential (primary) hypertension: Secondary | ICD-10-CM | POA: Insufficient documentation

## 2012-02-25 HISTORY — PX: CARDIOVERSION: SHX1299

## 2012-02-25 LAB — BASIC METABOLIC PANEL
BUN: 18 mg/dL (ref 6–23)
CO2: 30 mEq/L (ref 19–32)
Calcium: 9.3 mg/dL (ref 8.4–10.5)
Chloride: 96 mEq/L (ref 96–112)
Creatinine, Ser: 1.11 mg/dL (ref 0.50–1.35)
GFR calc Af Amer: 80 mL/min — ABNORMAL LOW (ref >90)
GFR calc non Af Amer: 69 mL/min — ABNORMAL LOW (ref >90)
Glucose, Bld: 105 mg/dL — ABNORMAL HIGH (ref 70–99)
Potassium: 3.5 mEq/L (ref 3.5–5.1)
Sodium: 136 mEq/L (ref 135–145)

## 2012-02-25 SURGERY — CARDIOVERSION
Anesthesia: General | Wound class: Clean

## 2012-02-25 MED ORDER — PROPOFOL 10 MG/ML IV BOLUS
INTRAVENOUS | Status: DC | PRN
  Administered 2012-02-25: 80 mg via INTRAVENOUS

## 2012-02-25 MED ORDER — SODIUM CHLORIDE 0.9 % IJ SOLN: 3.0000 mL | INTRAMUSCULAR | Status: DC | PRN

## 2012-02-25 MED ORDER — HYDROCORTISONE 1 % EX CREA: 1.0000 "application " | TOPICAL_CREAM | Freq: Three times a day (TID) | CUTANEOUS | Status: DC | PRN

## 2012-02-25 MED ORDER — SODIUM CHLORIDE 0.9 % IJ SOLN: 3.0000 mL | Freq: Two times a day (BID) | INTRAMUSCULAR | Status: DC

## 2012-02-25 MED ORDER — SODIUM CHLORIDE 0.9 % IV SOLN: 250.0000 mL | INTRAVENOUS | Status: DC

## 2012-02-25 MED ORDER — LIDOCAINE HCL (CARDIAC) 20 MG/ML IV SOLN
INTRAVENOUS | Status: DC | PRN
  Administered 2012-02-25: 70 mg via INTRAVENOUS

## 2012-02-25 MED ORDER — AMIODARONE HCL 200 MG PO TABS: 200.0000 mg | ORAL_TABLET | Freq: Two times a day (BID) | ORAL | Status: DC

## 2012-02-25 NOTE — Preoperative (Signed)
Beta Blockers   Reason not to administer Beta Blockers:Not Applicable 

## 2012-02-25 NOTE — Interval H&P Note (Signed)
History and Physical Interval Note:  02/25/2012 11:51 AM  [name]  has presented today for surgery, with the diagnosis of afib  The various methods of treatment have been discussed with the patient and family. After consideration of risks, benefits and other options for treatment, the patient has consented to  Procedure(s) (LRB) with comments: CARDIOVERSION (N/A) as a surgical intervention .  The patient's history has been reviewed, patient examined, no change in status, stable for surgery.  I have reviewed the patient's chart and labs.  Questions were answered to the patient's satisfaction.     [name]

## 2012-02-25 NOTE — Transfer of Care (Signed)
Immediate Anesthesia Transfer of Care Note  Patient: [name]  Procedure(s) Performed: Procedure(s) (LRB) with comments: CARDIOVERSION (N/A)  Patient Location: PACU  Anesthesia Type:MAC  Level of Consciousness: awake, alert  and oriented  Airway & Oxygen Therapy: Patient Spontanous Breathing and Patient connected to nasal cannula oxygen  Post-op Assessment: Report given to PACU RN, Post -op Vital signs reviewed and stable and Patient moving all extremities X 4  Post vital signs: Reviewed and stable  Complications: No apparent anesthesia complications

## 2012-02-25 NOTE — Anesthesia Preprocedure Evaluation (Addendum)
Anesthesia Evaluation   Patient awake    Reviewed: Allergy & Precautions, H&P , NPO status , Patient's Chart, lab work & pertinent test results  Airway Mallampati: I TM Distance: >3 FB Neck ROM: full    Dental   Pulmonary asthma , sleep apnea ,          Cardiovascular hypertension, + dysrhythmias Atrial Fibrillation + Valvular Problems/Murmurs MR and AS Rhythm:irregular Rate:Normal     Neuro/Psych  Neuromuscular disease    GI/Hepatic   Endo/Other  Hypothyroidism   Renal/GU      Musculoskeletal  (+) Fibromyalgia -  Abdominal   Peds  Hematology   Anesthesia Other Findings   Reproductive/Obstetrics                          Anesthesia Physical Anesthesia Plan  ASA: III  Anesthesia Plan: General   Post-op Pain Management:    Induction: Intravenous  Airway Management Planned: Mask  Additional Equipment:   Intra-op Plan:   Post-operative Plan:   Informed Consent: I have reviewed the patients History and Physical, chart, labs and discussed the procedure including the risks, benefits and alternatives for the proposed anesthesia with the patient or authorized representative who has indicated his/her understanding and acceptance.     Plan Discussed with: CRNA, Anesthesiologist and Surgeon  Anesthesia Plan Comments:         Anesthesia Quick Evaluation

## 2012-02-25 NOTE — Anesthesia Postprocedure Evaluation (Signed)
  Anesthesia Post-op Note  Patient: [name]  Procedure(s) Performed: Procedure(s) (LRB) with comments: CARDIOVERSION (N/A)  Patient Location: PACU  Anesthesia Type:General  Level of Consciousness: awake, alert , oriented and patient cooperative  Airway and Oxygen Therapy: Patient Spontanous Breathing  Post-op Pain: none  Post-op Assessment: Post-op Vital signs reviewed, Patient's Cardiovascular Status Stable, Respiratory Function Stable, Patent Airway, No signs of Nausea or vomiting and Pain level controlled  Post-op Vital Signs: stable  Complications: No apparent anesthesia complications

## 2012-02-25 NOTE — H&P (View-Only) (Signed)
PCP: PANG,RICHARD, MD Primary Cardiologist:  Dr Ganji  [name] is a 61 y.o. male who presents today for electrophysiology followup.  Since his afib ablation, the patient reports doing very well.   He had prolonged PR interval (400 msec) post ablation with wenckebach block.  He presents today for early follow-up.  He denies complications post ablation.  He is unaware of any afib.  Today, he denies symptoms of palpitations, chest pain, shortness of breath,  lower extremity edema, dizziness, presyncope, or syncope.  The patient is otherwise without complaint today.   Past Medical History  Diagnosis Date  . Atrial fibrillation     dx '04; DCCV '04, placed on flecainide, failed DCCV 04/2010, flecainide stopped; s/p successful A.fib ablation 01/31/12  . Hypertension   . Hypothyroidism   . Hyperlipidemia   . Fibromyalgia   . Chronic back pain   . DJD (degenerative joint disease)   . Obesity   . Mitral regurgitation     trivial  . Aortic stenosis, mild   . Left atrial enlargement     LA size 45mm by echo 11/21/11  . Obstructive sleep apnea     mild by sleep study 2013  . Asthma   . 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 Hand surgery     right  . Us echocardiography 08/07/2009    EF 55-60%  . Doppler echocardiography 03/11/2002    EF 70-75%  . Cardiovascular stress test 03/12/2002    EF 48%, NO EVIDENCE OF ISCHEMIA  . Back surgery     X2, lumbar.  . Tee without cardioversion 01/30/2012    Procedure: TRANSESOPHAGEAL ECHOCARDIOGRAM (TEE);  Surgeon: Dalton S McLean, MD;  Location: [facility];  Service: Cardiovascular;  Laterality: N/A;  ablation next day  . Atrial fibrillation ablation 01/30/12    PVI by Dr Allred    Current Outpatient Prescriptions  Medication Sig Dispense Refill  . albuterol (PROVENTIL HFA;VENTOLIN HFA) 108 (90 BASE) MCG/ACT inhaler Inhale 2 puffs into the lungs every 6 (six) hours as needed. For shortness  of breath.      . aliskiren (TEKTURNA) 300 MG tablet Take 300 mg by mouth daily.      . amLODipine (NORVASC) 5 MG tablet Take 5 mg by mouth daily.      . aspirin EC 81 MG tablet Take 81 mg by mouth daily.      . Azilsartan-Chlorthalidone 40-25 MG TABS Take 1 tablet by mouth every morning.      . Cholecalciferol (VITAMIN D3) 3000 UNITS TABS Take 1 tablet by mouth daily.       . cyclobenzaprine (FLEXERIL) 10 MG tablet Take 10 mg by mouth 2 (two) times daily as needed. For muscle spasms.      . dabigatran (PRADAXA) 150 MG CAPS Take 150 mg by mouth every 12 (twelve) hours.      . DULoxetine (CYMBALTA) 60 MG capsule Take 60 mg by mouth daily.      . fish oil-omega-3 fatty acids 1000 MG capsule Take 2 g by mouth daily.      . furosemide (LASIX) 20 MG tablet Take 20 mg by mouth daily as needed. For fluid retention      . levothyroxine (SYNTHROID, LEVOTHROID) 125 MCG tablet Take 125 mcg by mouth daily.      . Melatonin 3 MG CAPS Take 1-2 capsules by mouth at bedtime as needed. For insomnia.      .   pantoprazole (PROTONIX) 40 MG tablet Take 1 tablet (40 mg total) by mouth daily.  30 tablet  1  . potassium chloride SA (KLOR-CON M20) 20 MEQ tablet Take 1 tablet (20 mEq total) by mouth 2 (two) times daily.  60 tablet  3  . pravastatin (PRAVACHOL) 80 MG tablet Take 80 mg by mouth daily.      . traMADol-acetaminophen (ULTRACET) 37.5-325 MG per tablet Take 1 tablet by mouth every 6 (six) hours as needed. For pain      . zolpidem (AMBIEN) 10 MG tablet Take 10 mg by mouth at bedtime as needed. For sleep        Physical Exam: Filed Vitals:   02/14/12 1344  BP: 148/84  Pulse: 81  Height: 5' 11" (1.803 m)  Weight: 236 lb 1.9 oz (107.103 kg)    GEN- The patient is well appearing, alert and oriented x 3 today.   Head- normocephalic, atraumatic Eyes-  Sclera clear, conjunctiva pink Ears- hearing intact Oropharynx- clear Lungs- Clear to ausculation bilaterally, normal work of breathing Heart- irregular  rate and rhythm, no murmurs, rubs or gallops, PMI not laterally displaced GI- soft, NT, ND, + BS Extremities- no clubbing, cyanosis, or edema  ekg today reveals afib, V rate 81 bpm, nonspecific ST/T changes holter monitor reveals afib  Assessment and Plan:      

## 2012-02-25 NOTE — Op Note (Signed)
EP procedure Note   Pre procedure Diagnosis:  Persistent Atrial fibrillation Post procedure Diagnosis:  Same  Procedures:  Electrical cardioversion  Description:  Informed, written consent was obtained for cardioversion.  Adequate IV acces and airway support were assured.  The patient was adequately sedated with intravenous propofol as outlined in the anesthesia report.  The patient presented today in atrial fibrillation.  She was successfully cardioverted to sinus rhythm with a single synchronized biphasic 360J shock delivered with cardioversion electrodes placed in the anterior/posterior configuration.  She remains in sinus rhythm with a long first degree AV block thereafter.  There were no early apparent complications.  Conclusions:  1.  Successful cardioversion of afib to sinus rhythm 2.  No early apparent complications.  Add amiodarone 200mg  BID. Follow-up with me in 4 week.  Fayrene Fearing Allred,MD 12:40 PM 02/25/2012

## 2012-02-28 ENCOUNTER — Other Ambulatory Visit: Payer: Self-pay | Admitting: *Deleted

## 2012-02-28 DIAGNOSIS — I10 Essential (primary) hypertension: Secondary | ICD-10-CM

## 2012-02-28 DIAGNOSIS — I4891 Unspecified atrial fibrillation: Secondary | ICD-10-CM

## 2012-03-06 ENCOUNTER — Encounter (INDEPENDENT_AMBULATORY_CARE_PROVIDER_SITE_OTHER): Payer: BC Managed Care – PPO

## 2012-03-06 ENCOUNTER — Ambulatory Visit (INDEPENDENT_AMBULATORY_CARE_PROVIDER_SITE_OTHER): Payer: BC Managed Care – PPO | Admitting: *Deleted

## 2012-03-06 DIAGNOSIS — R0989 Other specified symptoms and signs involving the circulatory and respiratory systems: Secondary | ICD-10-CM

## 2012-03-06 DIAGNOSIS — I10 Essential (primary) hypertension: Secondary | ICD-10-CM

## 2012-03-06 DIAGNOSIS — I4891 Unspecified atrial fibrillation: Secondary | ICD-10-CM

## 2012-03-06 LAB — BASIC METABOLIC PANEL
BUN: 18 mg/dL (ref 6–23)
CO2: 32 mEq/L (ref 19–32)
Calcium: 9 mg/dL (ref 8.4–10.5)
Chloride: 95 mEq/L — ABNORMAL LOW (ref 96–112)
Creatinine, Ser: 1.3 mg/dL (ref 0.4–1.5)
GFR: 62.04 mL/min (ref >60.00)
Glucose, Bld: 115 mg/dL — ABNORMAL HIGH (ref 70–99)
Potassium: 3.2 mEq/L — ABNORMAL LOW (ref 3.5–5.1)
Sodium: 136 mEq/L (ref 135–145)

## 2012-03-06 NOTE — Progress Notes (Signed)
EKG obtained and given to Dr. Jens Som for review. Pt. C/o fatigue and weakness. Per Dr. Jens Som pt. is advised to continue Pradaxa (as well as all other medications), f/u with Dr. Johney Frame on 11/27 at 2:15 and to call office if he has worsening s/s, he verbalized understanding.

## 2012-03-11 ENCOUNTER — Telehealth: Payer: Self-pay | Admitting: Internal Medicine

## 2012-03-11 NOTE — Telephone Encounter (Signed)
New problem:   Returning call back to nurse on yesterday.

## 2012-03-11 NOTE — Telephone Encounter (Signed)
Spoke with wife and let her know patients K results and Dr Jenel Lucks recommendations as to follow up with PCP or Dr Jacinto Halim for recommendations and to have his labs repeated in 1 week.  She is concerned because he is still in afib.  Wants to know the next step.  They have a follow up on 11/27.  I let her know I would discuss with Dr Johney Frame and call her back.

## 2012-03-11 NOTE — Telephone Encounter (Signed)
Spoke with wife and let her know Dr Johney Frame does not want to make any changes at this point and to keep his follow up

## 2012-03-12 ENCOUNTER — Telehealth: Payer: Self-pay | Admitting: Cardiology

## 2012-03-25 ENCOUNTER — Ambulatory Visit (INDEPENDENT_AMBULATORY_CARE_PROVIDER_SITE_OTHER): Payer: BC Managed Care – PPO | Admitting: Internal Medicine

## 2012-03-25 ENCOUNTER — Encounter: Payer: Self-pay | Admitting: *Deleted

## 2012-03-25 ENCOUNTER — Other Ambulatory Visit: Payer: Self-pay | Admitting: *Deleted

## 2012-03-25 ENCOUNTER — Encounter: Payer: Self-pay | Admitting: Internal Medicine

## 2012-03-25 VITALS — BP 151/91 | HR 62 | Ht 71.0 in | Wt 239.0 lb

## 2012-03-25 DIAGNOSIS — I498 Other specified cardiac arrhythmias: Secondary | ICD-10-CM

## 2012-03-25 DIAGNOSIS — I10 Essential (primary) hypertension: Secondary | ICD-10-CM

## 2012-03-25 DIAGNOSIS — R011 Cardiac murmur, unspecified: Secondary | ICD-10-CM

## 2012-03-25 DIAGNOSIS — I440 Atrioventricular block, first degree: Secondary | ICD-10-CM

## 2012-03-25 DIAGNOSIS — R001 Bradycardia, unspecified: Secondary | ICD-10-CM

## 2012-03-25 DIAGNOSIS — I4891 Unspecified atrial fibrillation: Secondary | ICD-10-CM

## 2012-03-25 LAB — BASIC METABOLIC PANEL
BUN: 21 mg/dL (ref 6–23)
CO2: 32 mEq/L (ref 19–32)
Calcium: 9.3 mg/dL (ref 8.4–10.5)
Chloride: 94 mEq/L — ABNORMAL LOW (ref 96–112)
Creatinine, Ser: 1.3 mg/dL (ref 0.4–1.5)
GFR: 58.77 mL/min — ABNORMAL LOW (ref >60.00)
Glucose, Bld: 116 mg/dL — ABNORMAL HIGH (ref 70–99)
Potassium: 3.2 mEq/L — ABNORMAL LOW (ref 3.5–5.1)
Sodium: 135 mEq/L (ref 135–145)

## 2012-03-25 LAB — CBC WITH DIFFERENTIAL/PLATELET
Basophils Absolute: 0 10*3/uL (ref 0.0–0.1)
Basophils Relative: 0.3 % (ref 0.0–3.0)
Eosinophils Absolute: 0.1 10*3/uL (ref 0.0–0.7)
Eosinophils Relative: 1.1 % (ref 0.0–5.0)
HCT: 44.6 % (ref 39.0–52.0)
Hemoglobin: 14.6 g/dL (ref 13.0–17.0)
Lymphocytes Relative: 18.8 % (ref 12.0–46.0)
Lymphs Abs: 1.7 10*3/uL (ref 0.7–4.0)
MCHC: 32.6 g/dL (ref 30.0–36.0)
MCV: 91.7 fl (ref 78.0–100.0)
Monocytes Absolute: 0.8 10*3/uL (ref 0.1–1.0)
Monocytes Relative: 8.8 % (ref 3.0–12.0)
Neutro Abs: 6.6 10*3/uL (ref 1.4–7.7)
Neutrophils Relative %: 71 % (ref 43.0–77.0)
Platelets: 229 10*3/uL (ref 150.0–400.0)
RBC: 4.87 Mil/uL (ref 4.22–5.81)
RDW: 13.5 % (ref 11.5–14.6)
WBC: 9.3 10*3/uL (ref 4.5–10.5)

## 2012-03-25 NOTE — Patient Instructions (Signed)
Your physician has recommended that you have a Cardioversion (DCCV). Electrical Cardioversion uses a jolt of electricity to your heart either through paddles or wired patches attached to your chest. This is a controlled, usually prescheduled, procedure. Defibrillation is done under light anesthesia in the hospital, and you usually go home the day of the procedure. This is done to get your heart back into a normal rhythm. You are not awake for the procedure. Please see the instruction sheet given to you today.  Your physician has recommended that you have a pacemaker inserted. A pacemaker is a small device that is placed under the skin of your chest or abdomen to help control abnormal heart rhythms. This device uses electrical pulses to prompt the heart to beat at a normal rate. Pacemakers are used to treat heart rhythms that are too slow. Wire (leads) are attached to the pacemaker that goes into the chambers of you heart. This is done in the hospital and usually requires and overnight stay. Please see the instruction sheet given to you today for more information.

## 2012-03-27 ENCOUNTER — Encounter (HOSPITAL_COMMUNITY): Payer: Self-pay | Admitting: Pharmacy Technician

## 2012-03-27 ENCOUNTER — Other Ambulatory Visit (HOSPITAL_COMMUNITY): Payer: Self-pay | Admitting: Cardiology

## 2012-03-30 ENCOUNTER — Telehealth: Payer: Self-pay | Admitting: Internal Medicine

## 2012-03-30 MED ORDER — SODIUM CHLORIDE 0.9 % IR SOLN
80.0000 mg | Status: DC
  Filled 2012-03-30: qty 2

## 2012-03-30 MED ORDER — CEFAZOLIN SODIUM-DEXTROSE 2-3 GM-% IV SOLR
2.0000 g | INTRAVENOUS | Status: AC
  Administered 2012-03-31: 2 g via INTRAVENOUS
  Filled 2012-03-30 (×2): qty 50

## 2012-03-30 NOTE — Telephone Encounter (Signed)
PT'S WIFE CALLED RE LAB RESULTS  PT'S WIFE AWARE K REMAINS LOW AT 3.2  PT IS SCHEDULED FOR  DEVICE IMPLANT TOMORROW AND CARDIOVERSION WILL FORWARD TO [facility] TO REVIEW WITH DR Johney Frame .Zack Seal

## 2012-03-30 NOTE — Progress Notes (Signed)
PCP: PANG,RICHARD, MD Primary Cardiologist:  Dr Ganji  [name] is a 61 y.o. male who presents today for electrophysiology followup.  Since his afib ablation, the patient reports doing reasonably well.   He had prolonged PR interval (400 msec) post ablation with wenckebach block.  He has returned to afib.  He reports associated fatigue and decreased exercise tolerance.  Today, he denies symptoms of palpitations, chest pain, shortness of breath,  lower extremity edema, dizziness, presyncope, or syncope.  The patient is otherwise without complaint today.   Past Medical History  Diagnosis Date  . Atrial fibrillation     dx '04; DCCV '04, placed on flecainide, failed DCCV 04/2010, flecainide stopped; s/p successful A.fib ablation 01/31/12  . Hypertension   . Hypothyroidism   . Hyperlipidemia   . Fibromyalgia   . Chronic back pain   . DJD (degenerative joint disease)   . Obesity   . Mitral regurgitation     trivial  . Aortic stenosis, mild   . Left atrial enlargement     LA size 45mm by echo 11/21/11  . Obstructive sleep apnea     mild by sleep study 2013  . Asthma   . 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 Hand surgery     right  . Us echocardiography 08/07/2009    EF 55-60%  . Doppler echocardiography 03/11/2002    EF 70-75%  . Cardiovascular stress test 03/12/2002    EF 48%, NO EVIDENCE OF ISCHEMIA  . Back surgery     X2, lumbar.  . Tee without cardioversion 01/30/2012    Procedure: TRANSESOPHAGEAL ECHOCARDIOGRAM (TEE);  Surgeon: Dalton S McLean, MD;  Location: [facility];  Service: Cardiovascular;  Laterality: N/A;  ablation next day  . Atrial fibrillation ablation 01/30/12    PVI by Dr Allred    Current Outpatient Prescriptions  Medication Sig Dispense Refill  . aliskiren (TEKTURNA) 300 MG tablet Take 300 mg by mouth daily.      . amLODipine (NORVASC) 5 MG tablet Take 5 mg by mouth daily.      . aspirin EC 81 MG  tablet Take 81 mg by mouth daily.      . Azilsartan-Chlorthalidone 40-25 MG TABS Take 1 tablet by mouth every morning.      . Cholecalciferol (VITAMIN D) 2000 UNITS CAPS Take 2,000 Units by mouth daily.      . dabigatran (PRADAXA) 150 MG CAPS Take 150 mg by mouth every 12 (twelve) hours.      . DULoxetine (CYMBALTA) 60 MG capsule Take 60 mg by mouth daily.      . levothyroxine (SYNTHROID, LEVOTHROID) 125 MCG tablet Take 125 mcg by mouth daily.      . Melatonin 3 MG CAPS Take 6 mg by mouth at bedtime. For insomnia.      . potassium chloride SA (K-DUR,KLOR-CON) 20 MEQ tablet Take 40 mEq by mouth 2 (two) times daily.      . pravastatin (PRAVACHOL) 80 MG tablet Take 80 mg by mouth daily.      . zolpidem (AMBIEN) 10 MG tablet Take 10 mg by mouth at bedtime. For sleep      . amiodarone (PACERONE) 200 MG tablet Take 200 mg by mouth daily.      . pantoprazole (PROTONIX) 40 MG tablet TAKE ONE TABLET BY MOUTH EVERY DAY  30 tablet  3    Physical Exam: Filed Vitals:     03/25/12 1420  BP: 151/91  Pulse: 62  Height: 5' 11" (1.803 m)  Weight: 239 lb (108.41 kg)    GEN- The patient is well appearing, alert and oriented x 3 today.   Head- normocephalic, atraumatic Eyes-  Sclera clear, conjunctiva pink Ears- hearing intact Oropharynx- clear Lungs- Clear to ausculation bilaterally, normal work of breathing Heart- irregular rate and rhythm, no murmurs, rubs or gallops, PMI not laterally displaced GI- soft, NT, ND, + BS Extremities- no clubbing, cyanosis, or edema  ekg today reveals afib, V rate 62 bpm, nonspecific ST/T changes  Assessment and Plan:      

## 2012-03-30 NOTE — Assessment & Plan Note (Signed)
Continue medical adjustment following PPM implant.

## 2012-03-30 NOTE — Assessment & Plan Note (Signed)
Profound ( ) with frequent 2:1 AV conduction which limits our ability to medically treat his afib. Fatigue and decreased exercise tolerance likely worsened by bradycardia. Will proceed with PPM as above.

## 2012-03-30 NOTE — Assessment & Plan Note (Signed)
Very difficult afib to treat.  Profound AV block (PR ) with AV wenchebach has limited our ability to treat.  I think that our next step should therefore be to proceed with PPM implantation to allow for adequate medical therapy of his afib going forward.  We will continue amiodarone in the interim and plan cardioversion at the time of device implantation. Risks, benefits, alternatives to pacemaker implantation with cardioversion were discussed in detail with the patient today. The patient understands that the risks include but are not limited to bleeding, infection, pneumothorax, perforation, tamponade, vascular damage, renal failure, MI, stroke, death,  and lead dislodgement and wishes to proceed. We will therefore schedule the procedure at the next available time.  He will need to continue pradaxa perioperatively in anticipation of cardioversion.

## 2012-03-30 NOTE — Telephone Encounter (Signed)
New Problem:    Patient's wife called in wanting to know the results of her husband's recent lab work.  Please call back.

## 2012-03-31 ENCOUNTER — Ambulatory Visit (HOSPITAL_COMMUNITY)
Admission: RE | Admit: 2012-03-31 | Discharge: 2012-04-01 | Disposition: A | Discharge status: Home / Self Care | Payer: BC Managed Care – PPO | Source: Ambulatory Visit | Attending: Internal Medicine | Admitting: Internal Medicine

## 2012-03-31 ENCOUNTER — Encounter (HOSPITAL_COMMUNITY): Payer: Self-pay | Admitting: General Practice

## 2012-03-31 ENCOUNTER — Ambulatory Visit (HOSPITAL_COMMUNITY): Payer: BC Managed Care – PPO | Admitting: Anesthesiology

## 2012-03-31 ENCOUNTER — Encounter (HOSPITAL_COMMUNITY)
Admission: RE | Disposition: A | Discharge status: Home / Self Care | Payer: Self-pay | Source: Ambulatory Visit | Attending: Internal Medicine

## 2012-03-31 ENCOUNTER — Encounter (HOSPITAL_COMMUNITY): Payer: Self-pay | Admitting: Anesthesiology

## 2012-03-31 ENCOUNTER — Ambulatory Visit (HOSPITAL_COMMUNITY): Payer: BC Managed Care – PPO

## 2012-03-31 DIAGNOSIS — R001 Bradycardia, unspecified: Secondary | ICD-10-CM

## 2012-03-31 DIAGNOSIS — I4891 Unspecified atrial fibrillation: Secondary | ICD-10-CM

## 2012-03-31 DIAGNOSIS — R011 Cardiac murmur, unspecified: Secondary | ICD-10-CM

## 2012-03-31 DIAGNOSIS — I480 Paroxysmal atrial fibrillation: Secondary | ICD-10-CM | POA: Diagnosis present

## 2012-03-31 DIAGNOSIS — I441 Atrioventricular block, second degree: Secondary | ICD-10-CM

## 2012-03-31 DIAGNOSIS — Z950 Presence of cardiac pacemaker: Secondary | ICD-10-CM

## 2012-03-31 DIAGNOSIS — G4730 Sleep apnea, unspecified: Secondary | ICD-10-CM

## 2012-03-31 DIAGNOSIS — I10 Essential (primary) hypertension: Secondary | ICD-10-CM

## 2012-03-31 DIAGNOSIS — R5383 Other fatigue: Secondary | ICD-10-CM

## 2012-03-31 DIAGNOSIS — I440 Atrioventricular block, first degree: Secondary | ICD-10-CM

## 2012-03-31 HISTORY — PX: PERMANENT PACEMAKER INSERTION: SHX5480

## 2012-03-31 HISTORY — DX: Cardiac murmur, unspecified: R01.1

## 2012-03-31 HISTORY — DX: Presence of cardiac pacemaker: Z95.0

## 2012-03-31 HISTORY — PX: PACEMAKER INSERTION: SHX728

## 2012-03-31 HISTORY — DX: Atrioventricular block, second degree: I44.1

## 2012-03-31 HISTORY — DX: Other forms of dyspnea: R06.09

## 2012-03-31 HISTORY — PX: CARDIOVERSION: SHX1299

## 2012-03-31 HISTORY — DX: Primary osteoarthritis, unspecified hand: M19.049

## 2012-03-31 LAB — MRSA PCR SCREENING: MRSA by PCR: NEGATIVE

## 2012-03-31 LAB — POTASSIUM: Potassium: 3.8 mEq/L (ref 3.5–5.1)

## 2012-03-31 SURGERY — PERMANENT PACEMAKER INSERTION
Anesthesia: Monitor Anesthesia Care

## 2012-03-31 MED ORDER — IRBESARTAN 300 MG PO TABS
300.0000 mg | ORAL_TABLET | Freq: Every day | ORAL | Status: DC
  Administered 2012-04-01: 300 mg via ORAL
  Filled 2012-03-31: qty 1

## 2012-03-31 MED ORDER — MELATONIN 3 MG PO CAPS: 6.0000 mg | ORAL_CAPSULE | Freq: Every day | ORAL | Status: DC

## 2012-03-31 MED ORDER — AMLODIPINE BESYLATE 5 MG PO TABS
5.0000 mg | ORAL_TABLET | Freq: Every day | ORAL | Status: DC
  Administered 2012-04-01: 09:00:00 5 mg via ORAL
  Filled 2012-03-31: qty 1

## 2012-03-31 MED ORDER — HYDROMORPHONE HCL PF 1 MG/ML IJ SOLN: 0.2500 mg | INTRAMUSCULAR | Status: DC | PRN

## 2012-03-31 MED ORDER — ONDANSETRON HCL 4 MG/2ML IJ SOLN: 4.0000 mg | Freq: Four times a day (QID) | INTRAMUSCULAR | Status: DC | PRN

## 2012-03-31 MED ORDER — CHLORHEXIDINE GLUCONATE 4 % EX LIQD
60.0000 mL | Freq: Once | CUTANEOUS | Status: DC
  Filled 2012-03-31: qty 60

## 2012-03-31 MED ORDER — MIDAZOLAM HCL 5 MG/5ML IJ SOLN
INTRAMUSCULAR | Status: DC | PRN
  Administered 2012-03-31: 1 mg via INTRAVENOUS

## 2012-03-31 MED ORDER — HEPARIN (PORCINE) IN NACL 2-0.9 UNIT/ML-% IJ SOLN
INTRAMUSCULAR | Status: AC
  Filled 2012-03-31: qty 500

## 2012-03-31 MED ORDER — ONDANSETRON HCL 4 MG/2ML IJ SOLN: 4.0000 mg | Freq: Once | INTRAMUSCULAR | Status: DC | PRN

## 2012-03-31 MED ORDER — LEVOTHYROXINE SODIUM 125 MCG PO TABS
125.0000 ug | ORAL_TABLET | Freq: Every day | ORAL | Status: DC
  Administered 2012-04-01: 07:00:00 125 ug via ORAL
  Filled 2012-03-31 (×2): qty 1

## 2012-03-31 MED ORDER — SODIUM CHLORIDE 0.45 % IV SOLN: INTRAVENOUS | Status: DC | PRN

## 2012-03-31 MED ORDER — MUPIROCIN 2 % EX OINT
TOPICAL_OINTMENT | Freq: Two times a day (BID) | CUTANEOUS | Status: DC
  Administered 2012-03-31: 1 via NASAL
  Filled 2012-03-31: qty 22

## 2012-03-31 MED ORDER — PANTOPRAZOLE SODIUM 40 MG PO TBEC
40.0000 mg | DELAYED_RELEASE_TABLET | Freq: Every day | ORAL | Status: DC
  Administered 2012-04-01: 09:00:00 40 mg via ORAL
  Filled 2012-03-31: qty 1

## 2012-03-31 MED ORDER — CHLORTHALIDONE 25 MG PO TABS
25.0000 mg | ORAL_TABLET | Freq: Every day | ORAL | Status: DC
  Administered 2012-04-01: 09:00:00 25 mg via ORAL
  Filled 2012-03-31: qty 1

## 2012-03-31 MED ORDER — PROPOFOL INFUSION 10 MG/ML OPTIME
INTRAVENOUS | Status: DC | PRN
  Administered 2012-03-31: 50 ug/kg/min via INTRAVENOUS

## 2012-03-31 MED ORDER — YOU HAVE A PACEMAKER BOOK
Freq: Once | Status: AC
  Administered 2012-04-01: 07:00:00
  Filled 2012-03-31: qty 1

## 2012-03-31 MED ORDER — SODIUM CHLORIDE 0.9 % IJ SOLN: 3.0000 mL | Freq: Two times a day (BID) | INTRAMUSCULAR | Status: DC

## 2012-03-31 MED ORDER — ALISKIREN FUMARATE 150 MG PO TABS
300.0000 mg | ORAL_TABLET | Freq: Every day | ORAL | Status: DC
  Administered 2012-04-01: 09:00:00 300 mg via ORAL
  Filled 2012-03-31: qty 2

## 2012-03-31 MED ORDER — ACETAMINOPHEN 325 MG PO TABS: 325.0000 mg | ORAL_TABLET | ORAL | Status: DC | PRN

## 2012-03-31 MED ORDER — DULOXETINE HCL 60 MG PO CPEP
60.0000 mg | ORAL_CAPSULE | Freq: Every day | ORAL | Status: DC
  Administered 2012-04-01: 09:00:00 60 mg via ORAL
  Filled 2012-03-31: qty 1

## 2012-03-31 MED ORDER — SODIUM CHLORIDE 0.9 % IV SOLN: 250.0000 mL | INTRAVENOUS | Status: DC | PRN

## 2012-03-31 MED ORDER — POTASSIUM CHLORIDE CRYS ER 20 MEQ PO TBCR
40.0000 meq | EXTENDED_RELEASE_TABLET | Freq: Two times a day (BID) | ORAL | Status: DC
  Administered 2012-03-31 – 2012-04-01 (×2): 40 meq via ORAL
  Filled 2012-03-31 (×3): qty 2

## 2012-03-31 MED ORDER — LIDOCAINE HCL (PF) 1 % IJ SOLN
INTRAMUSCULAR | Status: AC
  Filled 2012-03-31: qty 60

## 2012-03-31 MED ORDER — AMIODARONE HCL 200 MG PO TABS
200.0000 mg | ORAL_TABLET | Freq: Every day | ORAL | Status: DC
  Administered 2012-04-01: 200 mg via ORAL
  Filled 2012-03-31: qty 1

## 2012-03-31 MED ORDER — CEFAZOLIN SODIUM 1-5 GM-% IV SOLN
1.0000 g | Freq: Four times a day (QID) | INTRAVENOUS | Status: AC
  Administered 2012-03-31 – 2012-04-01 (×3): 1 g via INTRAVENOUS
  Filled 2012-03-31 (×3): qty 50

## 2012-03-31 MED ORDER — DABIGATRAN ETEXILATE MESYLATE 150 MG PO CAPS
150.0000 mg | ORAL_CAPSULE | Freq: Two times a day (BID) | ORAL | Status: DC
  Administered 2012-03-31 – 2012-04-01 (×2): 150 mg via ORAL
  Filled 2012-03-31 (×3): qty 1

## 2012-03-31 MED ORDER — ZOLPIDEM TARTRATE 5 MG PO TABS
10.0000 mg | ORAL_TABLET | Freq: Every day | ORAL | Status: DC
  Administered 2012-03-31: 22:00:00 10 mg via ORAL
  Filled 2012-03-31: qty 2

## 2012-03-31 MED ORDER — SODIUM CHLORIDE 0.9 % IV SOLN: 250.0000 mL | INTRAVENOUS | Status: DC

## 2012-03-31 MED ORDER — FENTANYL CITRATE 0.05 MG/ML IJ SOLN
INTRAMUSCULAR | Status: DC | PRN
  Administered 2012-03-31: 50 ug via INTRAVENOUS

## 2012-03-31 MED ORDER — MUPIROCIN 2 % EX OINT
TOPICAL_OINTMENT | CUTANEOUS | Status: AC
  Administered 2012-03-31: 1 via NASAL
  Filled 2012-03-31: qty 22

## 2012-03-31 MED ORDER — AZILSARTAN-CHLORTHALIDONE 40-25 MG PO TABS: 1.0000 | ORAL_TABLET | Freq: Every morning | ORAL | Status: DC

## 2012-03-31 MED ORDER — HYDROCODONE-ACETAMINOPHEN 5-325 MG PO TABS
1.0000 | ORAL_TABLET | ORAL | Status: DC | PRN
  Administered 2012-03-31 (×3): 1 via ORAL
  Administered 2012-04-01: 05:00:00 2 via ORAL
  Filled 2012-03-31: qty 2
  Filled 2012-03-31 (×3): qty 1

## 2012-03-31 MED ORDER — PROPOFOL 10 MG/ML IV BOLUS
INTRAVENOUS | Status: DC | PRN
  Administered 2012-03-31: 35 mg via INTRAVENOUS

## 2012-03-31 MED ORDER — SODIUM CHLORIDE 0.9 % IJ SOLN: 3.0000 mL | INTRAMUSCULAR | Status: DC | PRN

## 2012-03-31 MED ORDER — SODIUM CHLORIDE 0.9 % IV SOLN
INTRAVENOUS | Status: DC | PRN
  Administered 2012-03-31: 13:00:00 via INTRAVENOUS

## 2012-03-31 MED ORDER — SODIUM CHLORIDE 0.45 % IV SOLN
INTRAVENOUS | Status: DC
  Administered 2012-03-31: 11:00:00 via INTRAVENOUS

## 2012-03-31 NOTE — Transfer of Care (Signed)
Immediate Anesthesia Transfer of Care Note  Patient: [name]  Procedure(s) Performed: Procedure(s) (LRB) with comments: PERMANENT PACEMAKER INSERTION (N/A) CARDIOVERSION (N/A)  Patient Location: PACU and Nursing Unit  Anesthesia Type:MAC  Level of Consciousness: awake, alert , oriented and patient cooperative  Airway & Oxygen Therapy: Patient Spontanous Breathing and Patient connected to nasal cannula oxygen  Post-op Assessment: Report given to PACU RN and Post -op Vital signs reviewed and stable  Post vital signs: Reviewed and stable  Complications: No apparent anesthesia complications

## 2012-03-31 NOTE — Anesthesia Postprocedure Evaluation (Signed)
  Anesthesia Post-op Note  Patient: [name]  Procedure(s) Performed: Procedure(s) (LRB) with comments: PERMANENT PACEMAKER INSERTION (N/A) CARDIOVERSION (N/A)  Patient Location: Nursing Unit  Anesthesia Type:MAC  Level of Consciousness: awake, alert  and oriented  Airway and Oxygen Therapy: Patient Spontanous Breathing and Patient connected to nasal cannula oxygen  Post-op Pain: mild  Post-op Assessment: Post-op Vital signs reviewed and Patient's Cardiovascular Status Stable  Post-op Vital Signs: stable  Complications: No apparent anesthesia complications

## 2012-03-31 NOTE — H&P (View-Only) (Signed)
PCP: Juline Patch, MD Primary Cardiologist:  [name] is a 61 y.o. male who presents today for electrophysiology followup.  Since his afib ablation, the patient reports doing reasonably well.   He had prolonged PR interval (400 msec) post ablation with wenckebach block.  He has returned to afib.  He reports associated fatigue and decreased exercise tolerance.  Today, he denies symptoms of palpitations, chest pain, shortness of breath,  lower extremity edema, dizziness, presyncope, or syncope.  The patient is otherwise without complaint today.   Past Medical History  Diagnosis Date  . Atrial fibrillation     dx '04; DCCV '04, placed on flecainide, failed DCCV 04/2010, flecainide stopped; s/p successful A.fib ablation 01/31/12  . Hypertension   . Hypothyroidism   . Hyperlipidemia   . Fibromyalgia   . Chronic back pain   . DJD (degenerative joint disease)   . Obesity   . Mitral regurgitation     trivial  . Aortic stenosis, mild   . Left atrial enlargement     LA size 45mm by echo 11/21/11  . Obstructive sleep apnea     mild by sleep study 2013  . Asthma   . First degree AV block     post ablation, ~48ms   Past Surgical History  Procedure Date  . Hand surgery     right  . US echocardiography 08/07/2009    EF 55-60%  . Doppler echocardiography 03/11/2002    EF 70-75%  . Cardiovascular stress test 03/12/2002    EF 48%, NO EVIDENCE OF ISCHEMIA  . Back surgery     X2, lumbar.  Rhae Hammock without cardioversion 01/30/2012    Procedure: TRANSESOPHAGEAL ECHOCARDIOGRAM (TEE);  Surgeon: Laurey Morale, MD;  Location: [facility] END[facility]COPY;  Service: Cardiovascular;  Laterality: N/A;  ablation next day  . Atrial fibrillation ablation 01/30/12    PVI by Victor Johney Frame    Current Outpatient Prescriptions  Medication Sig Dispense Refill  . aliskiren (TEKTURNA) 300 MG tablet Take 300 mg by mouth daily.      [name] amLODipine (NORVASC) 5 MG tablet Take 5 mg by mouth daily.      [name] aspirin EC 81 MG  tablet Take 81 mg by mouth daily.      . Azilsartan-Chlorthalidone 40-25 MG TABS Take 1 tablet by mouth every morning.      . Cholecalciferol (VITAMIN D) 2000 UNITS CAPS Take 2,000 Units by mouth daily.      . dabigatran (PRADAXA) 150 MG CAPS Take 150 mg by mouth every 12 (twelve) hours.      . DULoxetine (CYMBALTA) 60 MG capsule Take 60 mg by mouth daily.      [name] levothyroxine (SYNTHROID, LEVOTHROID) 125 MCG tablet Take 125 mcg by mouth daily.      . Melatonin 3 MG CAPS Take 6 mg by mouth at bedtime. For insomnia.      . potassium chloride SA (K-DUR,KLOR-CON) 20 MEQ tablet Take 40 mEq by mouth 2 (two) times daily.      . pravastatin (PRAVACHOL) 80 MG tablet Take 80 mg by mouth daily.      [name] zolpidem (AMBIEN) 10 MG tablet Take 10 mg by mouth at bedtime. For sleep      . amiodarone (PACERONE) 200 MG tablet Take 200 mg by mouth daily.      . pantoprazole (PROTONIX) 40 MG tablet TAKE ONE TABLET BY MOUTH EVERY DAY  30 tablet  3    Physical Exam: Filed Vitals:  03/25/12 1420  BP: 151/91  Pulse: 62  Height: 5\' 11"  (1.803 m)  Weight: 239 lb (108.41 kg)    GEN- The patient is well appearing, alert and oriented x 3 today.   Head- normocephalic, atraumatic Eyes-  Sclera clear, conjunctiva pink Ears- hearing intact Oropharynx- clear Lungs- Clear to ausculation bilaterally, normal work of breathing Heart- irregular rate and rhythm, no murmurs, rubs or gallops, PMI not laterally displaced GI- soft, NT, ND, + BS Extremities- no clubbing, cyanosis, or edema  ekg today reveals afib, V rate 62 bpm, nonspecific ST/T changes  Assessment and Plan:

## 2012-03-31 NOTE — Progress Notes (Signed)
Utilization Review Completed.   Kimberly Tucker, RN, BSN Nurse Case Manager  [phone]  

## 2012-03-31 NOTE — Anesthesia Preprocedure Evaluation (Addendum)
Anesthesia Evaluation  Patient identified by MRN, date of birth, ID band Patient awake    Reviewed: Allergy & Precautions, H&P , NPO status , Patient's Chart, lab work & pertinent test results  Airway Mallampati: II TM Distance: >3 FB     Dental  (+) Dental Advisory Given and Teeth Intact   Pulmonary asthma , sleep apnea and Continuous Positive Airway Pressure Ventilation ,    Pulmonary exam normal       Cardiovascular hypertension, Pt. on medications + dysrhythmias Atrial Fibrillation + Valvular Problems/Murmurs MR Rhythm:Irregular Rate:Normal  ------------------------------------------------------------ Study Conclusions  - Left ventricle: The cavity size was normal. There was mild   concentric hypertrophy. Systolic function was normal. The   estimated ejection fraction was in the range of 55% to   60%. - Aortic valve: Trileaflet; moderately calcified leaflets.   There was mild to moderate stenosis. Mean gradient: 22mm   Hg (S). Peak gradient: 37mm Hg (S). Valve area:   1.6cm^2(VTI). Valve area: 1.65cm^2 (Vmax). 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 - Mitral valve: Trivial regurgitation. - Left atrium: The atrium was mildly dilated. No evidence of   thrombus in the atrial cavity or appendage. - Right atrium: The atrium was mildly dilated. - Atrial septum: No defect or patent foramen ovale was   identified. Echo contrast study showed no right-to-left   atrial level shunt, at baseline or with provocation. - Tricuspid valve: Peak RV-RA gradient: 18mm Hg (S). Transesophageal echocardiography.  2D and color Doppler. Height:  Height: 180.3cm. Height: 71in.  Weight:  Weight: 107.3kg. Weight: 236lb.  Body mass index:  BMI: 33kg/m^2. Body surface area:    BSA: 2.77m^2.  Blood pressure: 159/98.  Patient status:  Outpatient.  Location:  Endoscopy.  01-30-12   Neuro/Psych  Neuromuscular disease    GI/Hepatic Neg liver  ROS, GERD-  Controlled and Medicated,  Endo/Other  Hypothyroidism   Renal/GU negative Renal ROS     Musculoskeletal  (+) Fibromyalgia -  Abdominal   Peds  Hematology   Anesthesia Other Findings   Reproductive/Obstetrics                       Anesthesia Physical Anesthesia Plan  ASA: III  Anesthesia Plan: MAC and General   Post-op Pain Management:    Induction: Intravenous  Airway Management Planned: Mask  Additional Equipment:   Intra-op Plan:   Post-operative Plan:   Informed Consent:   Dental advisory given  Plan Discussed with: CRNA and Anesthesiologist  Anesthesia Plan Comments:        Anesthesia Quick Evaluation

## 2012-03-31 NOTE — Anesthesia Postprocedure Evaluation (Signed)
  Anesthesia Post-op Note  Patient: [name]  Procedure(s) Performed: Procedure(s) (LRB) with comments: PERMANENT PACEMAKER INSERTION (N/A) CARDIOVERSION (N/A)  Patient Location: PACU and Nursing Unit  Anesthesia Type:MAC and General  Level of Consciousness: awake, alert , oriented and patient cooperative  Airway and Oxygen Therapy: Patient Spontanous Breathing and Patient connected to nasal cannula oxygen  Post-op Pain: none  Post-op Assessment: Post-op Vital signs reviewed, Patient's Cardiovascular Status Stable, Respiratory Function Stable, No signs of Nausea or vomiting and Pain level controlled  Post-op Vital Signs: Reviewed and stable  Complications: No apparent anesthesia complications

## 2012-03-31 NOTE — Anesthesia Procedure Notes (Signed)
Procedure Name: MAC Date/Time: 03/31/2012 12:35 PM Performed by: Tyrone Nine Pre-anesthesia Checklist: Patient identified, Patient being monitored, Emergency Drugs available, Timeout performed and Suction available Patient Re-evaluated:Patient Re-evaluated prior to inductionOxygen Delivery Method: Nasal cannula Intubation Type: IV induction

## 2012-03-31 NOTE — Preoperative (Signed)
Beta Blockers   Reason not to administer Beta Blockers:Not Applicable 

## 2012-03-31 NOTE — Op Note (Signed)
SURGEON:  [name], MD     PREPROCEDURE DIAGNOSIS:  Symptomatic second degree AV block,  Persistent atrial fibrillation    POSTPROCEDURE DIAGNOSIS:  Symptomatic second degree AV block,  Persistent atrial fibrillation     PROCEDURES:   1. Left upper extremity venography.   2. Pacemaker implantation.   3. External cardioversion    INTRODUCTION: [name] is a 61 y.o. male  with a history of symptomatic second degree AV block and persistent atrial fibrillation who presents today for pacemaker implantation.  The patient reports ongoing fatigue and decreased exercise tolerance.  He recently was cardioverted and was noted to have profound 1st degree AV block with 2:1 AV block at 85 bpm.  Treatment of atrial fibrillation has been limited by second degree AV block.  The patient therefore presents today for pacemaker implantation.     DESCRIPTION OF PROCEDURE:  Informed written consent was obtained, and the patient was brought to the electrophysiology lab in a fasting state.  The patient received IV sedation as outlined in the anesthesia report for the procedure today.  The patients left chest was prepped and draped in the usual sterile fashion by the EP lab staff. The skin overlying the left deltopectoral region was infiltrated with lidocaine for local analgesia.  A 4-cm incision was made over the left deltopectoral region.  A left subcutaneous pacemaker pocket was fashioned using a combination of sharp and blunt dissection. Electrocautery was required to assure hemostasis.    Left Upper Extremity Venography: A venogram of the left upper extremity was performed, which revealed a large left cephalic vein, which emptied into a large left subclavian vein.  The left axillary vein was moderate in size.    RA/RV Lead Placement: The left axillary vein was therefore cannulated.  Through the left axillary vein, a [employer] model [phone] (serial number  [id number]) right atrial lead and a [facility] model [phone] (serial number  [id number]) right ventricular lead were advanced with fluoroscopic visualization into the right atrial appendage and right ventricular apex positions respectively.  The patient presented today in atrial fibrillation.  He was successfully cardioverted to sinus rhythm with a single synchronized biphasic 360J shock delivered with cardioversion electrodes placed in the anterior/posterior configuration. He remained in sinus rhythm thereafter.   Initial atrial lead P- waves measured 3.3 mV with impedance of 441 ohms and a threshold of 0.8 V at 0.5 msec.  Right ventricular lead R-waves measured 12 mV with an impedance of 608 ohms and a threshold of 0.5 V at 0.5 msec.  Both leads were secured to the pectoralis fascia using #2-0 silk over the suture sleeves.   Device Placement:  The leads were then connected to a [employer] Accent DR RF model [id number] (serial number [id number]) pacemaker.  The pocket was irrigated with copious gentamicin solution.  The pacemaker was then placed into the pocket.  The pocket was then closed in 2 layers with 2.0 Vicryl suture for the subcutaneous and subcuticular layers.  Steri-strips and a sterile dressing were then applied.  There were no early apparent complications.     CONCLUSIONS:   1. Successful implantation of a Industrial/product designer DR RF dual-chamber pacemaker for symptomatic second degree AV block  2. Successful cardioversion to sinus rhythm  3. No early apparent complications.           [name], MD 03/31/2012 2:05 PM

## 2012-03-31 NOTE — Interval H&P Note (Signed)
History and Physical Interval Note:  03/31/2012 12:17 PM  [name]  has presented today for surgery, with the diagnosis of Afib and symptomatic second degree AV block.  The various methods of treatment have been discussed with the patient and family. After consideration of risks, benefits and other options for treatment, the patient has consented to  Procedure(s) (LRB) with comments: PERMANENT PACEMAKER INSERTION (N/A) CARDIOVERSION (N/A) as a surgical intervention .  The patient's history has been reviewed, patient examined, no change in status, stable for surgery.  I have reviewed the patient's chart and labs.  Questions were answered to the patient's satisfaction.     [name]

## 2012-04-01 ENCOUNTER — Encounter: Payer: Self-pay | Admitting: *Deleted

## 2012-04-01 ENCOUNTER — Ambulatory Visit (HOSPITAL_COMMUNITY): Payer: BC Managed Care – PPO

## 2012-04-01 DIAGNOSIS — I441 Atrioventricular block, second degree: Secondary | ICD-10-CM

## 2012-04-01 DIAGNOSIS — Z950 Presence of cardiac pacemaker: Secondary | ICD-10-CM | POA: Insufficient documentation

## 2012-04-01 DIAGNOSIS — I4891 Unspecified atrial fibrillation: Secondary | ICD-10-CM

## 2012-04-01 NOTE — Discharge Summary (Signed)
ELECTROPHYSIOLOGY DISCHARGE SUMMARY    Patient ID: [name],  MRN: [id number], DOB/AGE: 62-May-1952 62 y.o.  Admit date: 03/31/2012 Discharge date: 04/01/2012  Primary Care Physician: Juline Patch, MD Primary Cardiologist: Jacinto Halim, MD  Primary Discharge Diagnosis:  1. Bradycardia, second degree AV block s/p PPM implantation 2. Atrial fibrillation s/p DCCV  Secondary Discharge Diagnoses:  1. HTN 2. Hypokalemia 3. Dyslipidemia 4. Mild aortic stenosis 5. Hypothyroidism 6. Asthma 7. OSA 8. Chronic back pain  Procedures This Admission:  1. Left upper extremity venography 2. Pacemaker implantation      [facility] model [phone] (serial number [id number]) right atrial lead and a [facility] model [id number] (serial       number [id number]) right ventricular lead were advanced with fluoroscopic visualization into the right atrial appendage       and right ventricular apex positions respectively. [address] [employer] Accent DR RF model [id number] (serial number [id number])       pacemaker. 3. External cardioversion CONCLUSIONS:  - Successful implantation of a Industrial/product designer DR RF dual-chamber PPM for symptomatic 2nd degree AV block  - Successful cardioversion to sinus rhythm  - No early apparent complications   History and Hospital Course:  [name] is a 61 year old man with symptomatic second degree AV block and persistent atrial fibrillation who has experienced ongoing fatigue and decreased exercise tolerance. He recently was cardioverted and was noted to have profound 1st degree AV block with 2:1 AV block at 85 bpm. Treatment of atrial fibrillation has been limited by second degree AV block. He therefore presented yesterday for pacemaker implantation. [name] tolerated this procedure well without any immediate complication. He remains hemodynamically stable and afebrile. His chest xray shows stable lead placement without pneumothorax. His device interrogation was reviewed  by Dr. Johney Frame and shows normal PPM function with stable lead parameters/measurements. His implant site is intact without significant bleeding or hematoma. He has been given discharge instructions including wound care and activity restrictions. He will follow-up in 10 days for wound check. He will follow-up with Dr. Johney Frame in 1 month. There were no changes made to his medications. He has been seen, examined and deemed stable for discharge today by Dr. [name].  Discharge Vitals: Blood pressure 165/90, pulse 70, temperature 98 F (36.7 C), temperature source Oral, resp. rate 17, height 5\' 11"  (1.803 m), weight 240 lb 1.3 oz (108.9 kg), SpO2 96.00%.   Labs: Lab Results  Component Value Date   WBC 9.3 03/25/2012   HGB 14.6 03/25/2012   HCT 44.6 03/25/2012   MCV 91.7 03/25/2012   PLT 229.0 03/25/2012    Lab 03/31/12 1041 03/25/12 1510  NA -- 135  K 3.8 --  CL -- 94*  CO2 -- 32  BUN -- 21  CREATININE -- 1.3  CALCIUM -- 9.3  PROT -- --  BILITOT -- --  ALKPHOS -- --  ALT -- --  AST -- --  GLUCOSE -- 116*    Disposition:  The patient is being discharged in stable condition.  Follow-up: Follow-up Information    Follow up with [name], MD. On 05/15/2012. (At 1:45 PM)    Contact information:   [address] [place] [address] [phone]      Follow up with [facility] [address] On 04/09/2012. (At 4:30 PM for wound check)    Contact information:   [address] [place] [address] [phone]  Discharge Medications:    Medication List     As of 04/01/2012  9:35 AM    TAKE these medications         aliskiren 300 MG tablet   Commonly known as: TEKTURNA   Take 300 mg by mouth daily.      amiodarone 200 MG tablet   Commonly known as: PACERONE   Take 200 mg by mouth daily.      amLODipine 5 MG tablet   Commonly known as: NORVASC   Take 5 mg by mouth daily.      aspirin EC 81 MG tablet   Take 81 mg by mouth  daily.      Azilsartan-Chlorthalidone 40-25 MG Tabs   Take 1 tablet by mouth every morning.      dabigatran 150 MG Caps   Commonly known as: PRADAXA   Take 150 mg by mouth every 12 (twelve) hours.      DULoxetine 60 MG capsule   Commonly known as: CYMBALTA   Take 60 mg by mouth daily.      levothyroxine 125 MCG tablet   Commonly known as: SYNTHROID, LEVOTHROID   Take 125 mcg by mouth daily.      Melatonin 3 MG Caps   Take 6 mg by mouth at bedtime. For insomnia.      pantoprazole 40 MG tablet   Commonly known as: PROTONIX   TAKE ONE TABLET BY MOUTH EVERY DAY      potassium chloride SA 20 MEQ tablet   Commonly known as: K-DUR,KLOR-CON   Take 40 mEq by mouth 2 (two) times daily.      pravastatin 80 MG tablet   Commonly known as: PRAVACHOL   Take 80 mg by mouth daily.      Vitamin D 2000 UNITS Caps   Take 2,000 Units by mouth daily.      zolpidem 10 MG tablet   Commonly known as: AMBIEN   Take 10 mg by mouth at bedtime. For sleep      Duration of Discharge Encounter: Greater than 30 minutes including physician time.  Limmie Patricia, PA-C 04/01/2012, 9:35 AM    [name], MD

## 2012-04-01 NOTE — Progress Notes (Addendum)
SUBJECTIVE: The patient is doing well today.  At this time, he denies chest pain, shortness of breath, or any new concerns.     [name] aliskiren  300 mg Oral Daily  . amiodarone  200 mg Oral Daily  . amLODipine  5 mg Oral Daily  . [COMPLETED]  ceFAZolin (ANCEF) IV  1 g Intravenous Q6H  . [COMPLETED]  ceFAZolin (ANCEF) IV  2 g Intravenous On Call  . chlorthalidone  25 mg Oral Daily  . dabigatran  150 mg Oral Q12H  . DULoxetine  60 mg Oral Daily  . [COMPLETED] heparin      . irbesartan  300 mg Oral Daily  . levothyroxine  125 mcg Oral QAC breakfast  . [COMPLETED] lidocaine      . pantoprazole  40 mg Oral Daily  . potassium chloride SA  40 mEq Oral BID  . [COMPLETED] you have a pacemaker book   Does not apply Once  . zolpidem  10 mg Oral QHS  . [DISCONTINUED] Azilsartan-Chlorthalidone  1 tablet Oral q morning - 10a  . [DISCONTINUED] chlorhexidine  60 mL Topical Once  . [DISCONTINUED] gentamicin irrigation  80 mg Irrigation On Call  . [DISCONTINUED] Melatonin  6 mg Oral QHS  . [DISCONTINUED] mupirocin ointment   Nasal BID  . [DISCONTINUED] sodium chloride  3 mL Intravenous Q12H  . [DISCONTINUED] sodium chloride  3 mL Intravenous Q12H      . [DISCONTINUED] sodium chloride 50 mL/hr at 03/31/12 1053  . [DISCONTINUED] sodium chloride      OBJECTIVE: Physical Exam: Filed Vitals:   03/31/12 1600 03/31/12 2010 04/01/12 0045 04/01/12 0435  BP: 157/88 159/82 162/91 163/98  Pulse:  72 70 73  Temp:  97.6 F (36.4 C) 97.8 F (36.6 C) 98 F (36.7 C)  TempSrc:  Oral Oral Oral  Resp: 17 15 16 17   Height:      Weight:    240 lb 1.3 oz (108.9 kg)  SpO2:  96% 96% 96%    Intake/Output Summary (Last 24 hours) at 04/01/12 0827 Last data filed at 04/01/12 0500  Gross per 24 hour  Intake   1320 ml  Output   1010 ml  Net    310 ml    Telemetry reveals sinus rhythm, V paced GEN- The patient is well appearing, alert and oriented x 3 today.   Head- normocephalic, atraumatic Eyes-   Sclera clear, conjunctiva pink Ears- hearing intact Oropharynx- clear Neck- supple, no JVP Lymph- no cervical lymphadenopathy Lungs- Clear to ausculation bilaterally, normal work of breathing Heart- Regular rate and rhythm, no murmurs, rubs or gallops, PMI not laterally displaced GI- soft, NT, ND, + BS Extremities- no clubbing, cyanosis, or edema Skin- no rash or lesion Pacemaker pocket with ecchymosis, small amount of heme on the dressing but no hematoma or active bleeding Neuro- strength and sensation are intact  LABS: Basic Metabolic Panel:  Basename 03/31/12 1041  NA --  K 3.8  CL --  CO2 --  GLUCOSE --  BUN --  CREATININE --  CALCIUM --  MG --  PHOS --   RADIOLOGY: Dg Chest 2 View  04/01/2012  *RADIOLOGY REPORT*  Clinical Data: Post pacemaker placement, shortness of breath  CHEST - 2 VIEW  Comparison: 03/31/2012  Findings: Dual lead left-sided pacer in place.  Heart size is normal.  Minimal central vascular congestion but no overt edema or focal pulmonary opacity.  No pleural effusion.  IMPRESSION: Mild central vascular congestion but no acute  abnormality after left sided dual lead pacemaker placement.   Original Report Authenticated By: Christiana Pellant, M.D.    Dg Chest 2 View  03/31/2012  *RADIOLOGY REPORT*  Clinical Data: Pre pacemaker insertion  CHEST - 2 VIEW  Comparison: 05/03/2010  Findings: Mild cardiac enlargement without heart failure.  Lungs are clear without infiltrate or effusion.  No mass lesion is present.  IMPRESSION: No active cardiopulmonary disease and no interval change.   Original Report Authenticated By: Janeece Riggers, M.D.     ASSESSMENT AND PLAN:  Active Problems:  Atrial fibrillation  Bradycardia  Second degree AV block  1. Second degree AV block Doing well s/p PPM Device interrogation is reviewed in detail today and is normal (see paper chart), CXR reveals stable leads, no ptx Routine wound care Wound check in 10 days, follow-up with me in 1  month  2. afib Doing well s/p [facility] Continue pradaxa and amiodarone Follow-up with me in 4 weeks  3. HTN Resume home medicine regimen  4. Hypokalemia Follow-up with PCP and Dr Jacinto Halim  DC to home today   [name], MD 04/01/2012 8:27 AM

## 2012-04-01 NOTE — Progress Notes (Signed)
Pt's BP 163/98, Dr. Aileen Pilot made aware, no new orders received.

## 2012-04-07 NOTE — Addendum Note (Signed)
Addended by: Micki Riley C on: 04/07/2012 02:37 PM   Modules accepted: Orders

## 2012-04-09 ENCOUNTER — Ambulatory Visit (INDEPENDENT_AMBULATORY_CARE_PROVIDER_SITE_OTHER): Payer: BC Managed Care – PPO | Admitting: *Deleted

## 2012-04-09 ENCOUNTER — Encounter: Payer: Self-pay | Admitting: Internal Medicine

## 2012-04-09 DIAGNOSIS — R001 Bradycardia, unspecified: Secondary | ICD-10-CM

## 2012-04-09 DIAGNOSIS — I440 Atrioventricular block, first degree: Secondary | ICD-10-CM

## 2012-04-09 DIAGNOSIS — I498 Other specified cardiac arrhythmias: Secondary | ICD-10-CM

## 2012-04-09 LAB — PACEMAKER DEVICE OBSERVATION
AL AMPLITUDE: 4.6 mv
AL IMPEDENCE PM: 450 Ohm
AL THRESHOLD: 0.5 V
ATRIAL PACING PM: 1
BAMS-0001: 150 {beats}/min
BAMS-0003: 70 {beats}/min
BATTERY VOLTAGE: 2.9779 V
DEVICE MODEL PM: 7408461
RV LEAD AMPLITUDE: 6.6 mv
RV LEAD IMPEDENCE PM: 587.5 Ohm
RV LEAD THRESHOLD: 1.625 V
VENTRICULAR PACING PM: 100

## 2012-04-09 NOTE — Progress Notes (Signed)
Wound check-PPM 

## 2012-04-24 ENCOUNTER — Telehealth: Payer: Self-pay | Admitting: Internal Medicine

## 2012-04-24 NOTE — Telephone Encounter (Signed)
Received FMLA paperwork from [facility] on 12.26.13. Forwarded to Dr. Johney Frame on 12.27.13 for review and signing...[name]

## 2012-05-15 ENCOUNTER — Ambulatory Visit (INDEPENDENT_AMBULATORY_CARE_PROVIDER_SITE_OTHER): Payer: BC Managed Care – PPO | Admitting: Internal Medicine

## 2012-05-15 ENCOUNTER — Encounter: Payer: Self-pay | Admitting: Internal Medicine

## 2012-05-15 VITALS — BP 130/80 | HR 76 | Wt 239.8 lb

## 2012-05-15 DIAGNOSIS — I10 Essential (primary) hypertension: Secondary | ICD-10-CM

## 2012-05-15 DIAGNOSIS — I4891 Unspecified atrial fibrillation: Secondary | ICD-10-CM

## 2012-05-15 DIAGNOSIS — I440 Atrioventricular block, first degree: Secondary | ICD-10-CM

## 2012-05-15 DIAGNOSIS — Z950 Presence of cardiac pacemaker: Secondary | ICD-10-CM

## 2012-05-15 LAB — PACEMAKER DEVICE OBSERVATION
BAMS-0001: 150 {beats}/min
BAMS-0003: 70 {beats}/min
DEVICE MODEL PM: 7408461

## 2012-05-15 NOTE — Patient Instructions (Addendum)
Your physician recommends that you schedule a follow-up appointment in: 6 weeks with Dr Allred  

## 2012-05-15 NOTE — Progress Notes (Signed)
PCP: Juline Patch, MD Primary Cardiologist:  [name] is a 62 y.o. male who presents today for routine electrophysiology followup.  Since his recent pacemaker implantation the patient reports doing very well.  He continues to have some SOB but feels overall much better.  He has been able to maintain sinus rhythm.  Today, he denies symptoms of palpitations, chest pain,  lower extremity edema, dizziness, presyncope, or syncope.  The patient is otherwise without complaint today.   Past Medical History  Diagnosis Date  . Hypertension   . Hyperlipidemia   . Fibromyalgia   . Obesity   . Mitral regurgitation     trivial  . Aortic stenosis, mild   . Left atrial enlargement     LA size 45mm by echo 11/21/11  . Pacemaker 03/31/2012  . Heart murmur   . Asthma     "little bit" (03/31/2012)  . Exertional dyspnea   . Obstructive sleep apnea     mild by sleep study 2013  . Hypothyroidism     S/P radiation  . DJD (degenerative joint disease)   . Arthritis of hand     "just a little bit in both hands" (03/31/2012)  . Atrial fibrillation     dx '04; DCCV '04, placed on flecainide, failed DCCV 04/2010, flecainide stopped; s/p successful A.fib ablation 01/31/12  . First degree AV block     post ablation, ~47ms  . Second degree AV block    Past Surgical History  Procedure Date  . Finger tendon repair 1980's    "right little finger" (03/31/2012)  . US echocardiography 08/07/2009    EF 55-60%  . Doppler echocardiography 03/11/2002    EF 70-75%  . Cardiovascular stress test 03/12/2002    EF 48%, NO EVIDENCE OF ISCHEMIA  . Tee without cardioversion 01/30/2012    Procedure: TRANSESOPHAGEAL ECHOCARDIOGRAM (TEE);  Surgeon: Laurey Morale, MD;  Location: [facility] ENDOSCOPY;  Service: Cardiovascular;  Laterality: N/A;  ablation next day  . Insert / replace / remove pacemaker 03/31/2012    initial placement  . Atrial fibrillation ablation 01/30/2012    PVI by Victor. Johney Frame  . Lumbar disc surgery  1980's X2;  2000's  . Cardioversion 01/2012; 03/31/2012    Current Outpatient Prescriptions  Medication Sig Dispense Refill  . aliskiren (TEKTURNA) 300 MG tablet Take 300 mg by mouth daily.      [name] amiodarone (PACERONE) 200 MG tablet Take 200 mg by mouth daily.      [name] amLODipine (NORVASC) 5 MG tablet Take 5 mg by mouth daily.      [name] aspirin EC 81 MG tablet Take 81 mg by mouth daily.      . Celecoxib (CELEBREX PO) Take by mouth.      . Cholecalciferol (VITAMIN D) 2000 UNITS CAPS Take 2,000 Units by mouth daily.      . cyclobenzaprine (FLEXERIL) 10 MG tablet       . dabigatran (PRADAXA) 150 MG CAPS Take 150 mg by mouth every 12 (twelve) hours.      . DULoxetine (CYMBALTA) 60 MG capsule Take 60 mg by mouth daily.      [name] EDARBI 80 MG TABS       . levothyroxine (SYNTHROID, LEVOTHROID) 137 MCG tablet Take 137 mcg by mouth daily.      . Melatonin 3 MG CAPS Take 6 mg by mouth at bedtime. For insomnia.      . pantoprazole (PROTONIX) 40 MG tablet TAKE ONE TABLET BY  MOUTH EVERY DAY  30 tablet  3  . potassium chloride SA (K-DUR,KLOR-CON) 20 MEQ tablet Take 40 mEq by mouth 2 (two) times daily.      . traMADol-acetaminophen (ULTRACET) 37.5-325 MG per tablet       . zolpidem (AMBIEN) 10 MG tablet Take 10 mg by mouth at bedtime. For sleep      . Azilsartan-Chlorthalidone 40-25 MG TABS Take 1 tablet by mouth every morning.        Physical Exam: Filed Vitals:   05/15/12 1347  BP: 130/80  Pulse: 76  Weight: 239 lb 12.8 oz (108.773 kg)    GEN- The patient is well appearing, alert and oriented x 3 today.   Head- normocephalic, atraumatic Eyes-  Sclera clear, conjunctiva pink Ears- hearing intact Oropharynx- clear Lungs- Clear to ausculation bilaterally, normal work of breathing Chest- pacemaker pocket is well healed Heart- Regular rate and rhythm, no murmurs, rubs or gallops, PMI not laterally displaced GI- soft, NT, ND, + BS Extremities- no clubbing, cyanosis, or edema  Pacemaker interrogation-  reviewed in detail today,  See PACEART report  Assessment and Plan:  1. Second degree AV block Normal pacemaker function See Pace Art report No changes today  2. Maintaining sinus rhythm Return in 6 weeks We will likely decrease amiodarone to 100mg  daily at that time  3. htn Stable No change required today

## 2012-05-28 ENCOUNTER — Encounter: Payer: Self-pay | Admitting: *Deleted

## 2012-05-28 ENCOUNTER — Telehealth: Payer: Self-pay | Admitting: Internal Medicine

## 2012-05-28 NOTE — Telephone Encounter (Signed)
Back To Work Note Faxed To [employer] [facility]/HR Dept  [phone] call Back @ [phone]  05/28/12/KM

## 2012-06-25 ENCOUNTER — Encounter: Payer: Self-pay | Admitting: Internal Medicine

## 2012-06-25 ENCOUNTER — Ambulatory Visit (INDEPENDENT_AMBULATORY_CARE_PROVIDER_SITE_OTHER): Payer: BC Managed Care – PPO | Admitting: Internal Medicine

## 2012-06-25 VITALS — BP 160/90 | HR 82 | Ht 71.0 in | Wt 235.8 lb

## 2012-06-25 DIAGNOSIS — I4891 Unspecified atrial fibrillation: Secondary | ICD-10-CM

## 2012-06-25 DIAGNOSIS — I440 Atrioventricular block, first degree: Secondary | ICD-10-CM

## 2012-06-25 DIAGNOSIS — Z950 Presence of cardiac pacemaker: Secondary | ICD-10-CM

## 2012-06-25 DIAGNOSIS — I441 Atrioventricular block, second degree: Secondary | ICD-10-CM

## 2012-06-25 DIAGNOSIS — I10 Essential (primary) hypertension: Secondary | ICD-10-CM

## 2012-06-25 DIAGNOSIS — G4730 Sleep apnea, unspecified: Secondary | ICD-10-CM

## 2012-06-25 LAB — PACEMAKER DEVICE OBSERVATION
AL AMPLITUDE: 5 mv
AL IMPEDENCE PM: 462.5 Ohm
AL THRESHOLD: 0.75 V
ATRIAL PACING PM: 4.2
BAMS-0001: 150 {beats}/min
BAMS-0003: 70 {beats}/min
BATTERY VOLTAGE: 2.9629 V
DEVICE MODEL PM: 7408461
RV LEAD AMPLITUDE: 5.4 mv
RV LEAD IMPEDENCE PM: 675 Ohm
RV LEAD THRESHOLD: 1 V
VENTRICULAR PACING PM: 99

## 2012-06-25 NOTE — Patient Instructions (Addendum)
Your physician recommends that you schedule a follow-up appointment in 3 months with Dr Allred    

## 2012-06-28 NOTE — Progress Notes (Signed)
PCP: Juline Patch, MD Primary Cardiologist:  [name] is a 62 y.o. male who presents today for routine electrophysiology followup.  He has been able to maintain sinus rhythm.  His primary concern today is with chronic back pain.  Today, he denies symptoms of palpitations, chest pain,  lower extremity edema, dizziness, presyncope, or syncope.  [name] is otherwise without complaint today.   Past Medical History  Diagnosis Date  . Hypertension   . Hyperlipidemia   . Fibromyalgia   . Obesity   . Mitral regurgitation     trivial  . Aortic stenosis, mild   . Left atrial enlargement     LA size 45mm by echo 11/21/11  . Pacemaker 03/31/2012  . Heart murmur   . Asthma     "little bit" (03/31/2012)  . Exertional dyspnea   . Obstructive sleep apnea     mild by sleep study 2013  . Hypothyroidism     S/P radiation  . DJD (degenerative joint disease)   . Arthritis of hand     "just a little bit in both hands" (03/31/2012)  . Atrial fibrillation     dx '04; DCCV '04, placed on flecainide, failed DCCV 04/2010, flecainide stopped; s/p successful A.fib ablation 01/31/12  . First degree AV block     post ablation, ~420ms  . Second degree AV block    Past Surgical History  Procedure Laterality Date  . Finger tendon repair  1980's    "right little finger" (03/31/2012)  . US echocardiography  08/07/2009    EF 55-60%  . Doppler echocardiography  03/11/2002    EF 70-75%  . Cardiovascular stress test  03/12/2002    EF 48%, NO EVIDENCE OF ISCHEMIA  . Tee without cardioversion  01/30/2012    Procedure: TRANSESOPHAGEAL ECHOCARDIOGRAM (TEE);  Surgeon: Laurey Morale, MD;  Location: [facility] ENDOSCOPY;  Service: Cardiovascular;  Laterality: N/A;  ablation next day  . Insert / replace / remove pacemaker  03/31/2012    initial placement  . Atrial fibrillation ablation  01/30/2012    PVI by Victor. Johney Frame  . Lumbar disc surgery  1980's X2;  2000's  . Cardioversion  01/2012; 03/31/2012     Current Outpatient Prescriptions  Medication Sig Dispense Refill  . aliskiren (TEKTURNA) 300 MG tablet Take 300 mg by mouth daily.      [name] amiodarone (PACERONE) 200 MG tablet Take 200 mg by mouth daily.      [name] amLODipine (NORVASC) 5 MG tablet Take 5 mg by mouth daily.      [name] aspirin EC 81 MG tablet Take 81 mg by mouth daily.      . Cholecalciferol (VITAMIN D) 2000 UNITS CAPS Take 2,000 Units by mouth daily.      . cyclobenzaprine (FLEXERIL) 10 MG tablet       . dabigatran (PRADAXA) 150 MG CAPS Take 150 mg by mouth every 12 (twelve) hours.      . DULoxetine (CYMBALTA) 60 MG capsule Take 60 mg by mouth daily.      [name] EDARBI 80 MG TABS       . levothyroxine (SYNTHROID, LEVOTHROID) 125 MCG tablet Take 125 mcg by mouth daily.      . Melatonin 3 MG CAPS Take 6 mg by mouth at bedtime. For insomnia.      . pantoprazole (PROTONIX) 40 MG tablet TAKE ONE TABLET BY MOUTH EVERY DAY  30 tablet  3  . potassium chloride SA (K-DUR,KLOR-CON) 20 MEQ  tablet Take 40 mEq by mouth 2 (two) times daily.      . traMADol-acetaminophen (ULTRACET) 37.5-325 MG per tablet       . zolpidem (AMBIEN) 10 MG tablet Take 10 mg by mouth at bedtime. For sleep       No current facility-administered medications for this visit.    Physical Exam: Filed Vitals:   06/25/12 1407  BP: 160/90  Pulse: 82  Height: 5\' 11"  (1.803 m)  Weight: 235 lb 12.8 oz (106.958 kg)    GEN- [name] is well appearing, alert and oriented x 3 today.   Head- normocephalic, atraumatic Eyes-  Sclera clear, conjunctiva pink Ears- hearing intact Oropharynx- clear Lungs- Clear to ausculation bilaterally, normal work of breathing Chest- pacemaker pocket is well healed Heart- Regular rate and rhythm, no murmurs, rubs or gallops, PMI not laterally displaced GI- soft, NT, ND, + BS Extremities- no clubbing, cyanosis, or edema  Pacemaker interrogation- reviewed in detail today,  See PACEART report  Assessment and Plan:  1. Second degree AV  block Normal pacemaker function See Pace Art report No changes today  2. Maintaining sinus rhythm Stop amiodarone Continue anticoagulation  3. htn Stable No change required today

## 2012-07-08 NOTE — Telephone Encounter (Signed)
Patient follows [name] regarding A. Fibrillation

## 2012-07-09 ENCOUNTER — Telehealth: Payer: Self-pay | Admitting: Internal Medicine

## 2012-07-09 NOTE — Telephone Encounter (Signed)
Pt Picked up [facility] papers 07/09/12/KM

## 2012-07-20 ENCOUNTER — Other Ambulatory Visit: Payer: Self-pay | Admitting: Internal Medicine

## 2012-07-20 DIAGNOSIS — J984 Other disorders of lung: Secondary | ICD-10-CM

## 2012-07-20 DIAGNOSIS — R0602 Shortness of breath: Secondary | ICD-10-CM

## 2012-07-24 ENCOUNTER — Ambulatory Visit
Admission: RE | Admit: 2012-07-24 | Discharge: 2012-07-24 | Disposition: A | Discharge status: Home / Self Care | Payer: BC Managed Care – PPO | Source: Ambulatory Visit | Attending: Internal Medicine | Admitting: Internal Medicine

## 2012-07-24 DIAGNOSIS — J984 Other disorders of lung: Secondary | ICD-10-CM

## 2012-07-24 DIAGNOSIS — R0602 Shortness of breath: Secondary | ICD-10-CM

## 2012-09-15 ENCOUNTER — Encounter: Payer: Self-pay | Admitting: Cardiovascular Disease

## 2012-09-30 ENCOUNTER — Encounter: Payer: BC Managed Care – PPO | Admitting: Internal Medicine

## 2012-10-05 ENCOUNTER — Other Ambulatory Visit: Payer: Self-pay | Admitting: Internal Medicine

## 2012-10-05 DIAGNOSIS — R109 Unspecified abdominal pain: Secondary | ICD-10-CM

## 2012-10-07 ENCOUNTER — Institutional Professional Consult (permissible substitution): Payer: BC Managed Care – PPO | Admitting: Internal Medicine

## 2012-10-08 ENCOUNTER — Ambulatory Visit
Admission: RE | Admit: 2012-10-08 | Discharge: 2012-10-08 | Disposition: A | Discharge status: Home / Self Care | Payer: BC Managed Care – PPO | Source: Ambulatory Visit | Attending: Internal Medicine | Admitting: Internal Medicine

## 2012-10-08 DIAGNOSIS — R109 Unspecified abdominal pain: Secondary | ICD-10-CM

## 2012-10-09 ENCOUNTER — Encounter: Payer: Self-pay | Admitting: Internal Medicine

## 2012-10-09 ENCOUNTER — Ambulatory Visit (INDEPENDENT_AMBULATORY_CARE_PROVIDER_SITE_OTHER): Payer: BC Managed Care – PPO | Admitting: Internal Medicine

## 2012-10-09 VITALS — BP 148/86 | HR 78 | Ht 71.0 in | Wt 240.6 lb

## 2012-10-09 DIAGNOSIS — I10 Essential (primary) hypertension: Secondary | ICD-10-CM

## 2012-10-09 DIAGNOSIS — I440 Atrioventricular block, first degree: Secondary | ICD-10-CM

## 2012-10-09 DIAGNOSIS — I4891 Unspecified atrial fibrillation: Secondary | ICD-10-CM

## 2012-10-09 DIAGNOSIS — I441 Atrioventricular block, second degree: Secondary | ICD-10-CM

## 2012-10-09 LAB — PACEMAKER DEVICE OBSERVATION
AL AMPLITUDE: 5 mv
AL IMPEDENCE PM: 362.5 Ohm
AL THRESHOLD: 0.5 V
ATRIAL PACING PM: 1.1
BAMS-0001: 150 {beats}/min
BAMS-0003: 70 {beats}/min
BATTERY VOLTAGE: 2.9478 V
DEVICE MODEL PM: 7408461
RV LEAD AMPLITUDE: 5.8 mv
RV LEAD IMPEDENCE PM: 675 Ohm
RV LEAD THRESHOLD: 1.25 V
VENTRICULAR PACING PM: 99

## 2012-10-09 NOTE — Progress Notes (Signed)
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 male who presents today for routine electrophysiology followup.  He has been able to maintain sinus rhythm.  He has SOB with moderate activity.  Today, he denies symptoms of palpitations, chest pain,  lower extremity edema, dizziness, presyncope, or syncope.  The patient is otherwise without complaint today.   Past Medical History  Diagnosis Date  . Hypertension   . Hyperlipidemia   . Fibromyalgia   . Obesity   . Mitral regurgitation     trivial  . Aortic stenosis, mild   . Left atrial enlargement     LA size 45mm by echo 11/21/11  . Pacemaker 03/31/2012  . Heart murmur   . Asthma     "little bit" (03/31/2012)  . Exertional dyspnea   . Obstructive sleep apnea     mild by sleep study 2013  . Hypothyroidism     S/P radiation  . DJD (degenerative joint disease)   . Arthritis of hand     "just a little bit in both hands" (03/31/2012)  . Atrial fibrillation     dx '04; DCCV '04, placed on flecainide, failed DCCV 04/2010, flecainide stopped; s/p successful A.fib ablation 01/31/12  . 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 Second degree AV block    Past Surgical History  Procedure Laterality Date  . Finger tendon repair  1980's    "right little finger" (03/31/2012)  . US echocardiography  08/07/2009    EF 55-60%  . Doppler echocardiography  03/11/2002    EF 70-75%  . Cardiovascular stress test  03/12/2002    EF 48%, NO EVIDENCE OF ISCHEMIA  . Tee without cardioversion  01/30/2012    Procedure: TRANSESOPHAGEAL ECHOCARDIOGRAM (TEE);  Surgeon: Laurey Morale, MD;  Location: [facility] ENDOSCOPY;  Service: Cardiovascular;  Laterality: N/A;  ablation next day  . Insert / replace / remove pacemaker  03/31/2012    initial placement  . Atrial fibrillation ablation  01/30/2012    PVI by Dr. Johney Frame  . Lumbar disc surgery  1980's X2;  2000's  . Cardioversion  01/2012; 03/31/2012    Current Outpatient  Prescriptions  Medication Sig Dispense Refill  . aliskiren (TEKTURNA) 300 MG tablet Take 300 mg by mouth daily.      [name] amLODipine (NORVASC) 5 MG tablet Take 5 mg by mouth daily.      [name] aspirin EC 81 MG tablet Take 81 mg by mouth daily.      . Cholecalciferol (VITAMIN D) 2000 UNITS CAPS Take 2,000 Units by mouth daily.      . cyclobenzaprine (FLEXERIL) 10 MG tablet       . dabigatran (PRADAXA) 150 MG CAPS Take 150 mg by mouth every 12 (twelve) hours.      . DULoxetine (CYMBALTA) 60 MG capsule Take 60 mg by mouth daily.      [name] EDARBI 80 MG TABS       . levothyroxine (SYNTHROID, LEVOTHROID) 125 MCG tablet Take 125 mcg by mouth daily.      . Melatonin 3 MG CAPS Take 6 mg by mouth at bedtime. For insomnia.      . pantoprazole (PROTONIX) 40 MG tablet TAKE ONE TABLET BY MOUTH EVERY DAY  30 tablet  3  . zolpidem (AMBIEN) 10 MG tablet Take 10 mg by mouth at bedtime. For sleep       No current facility-administered medications for this  visit.    Physical Exam: Filed Vitals:   10/09/12 1005  BP: 148/86  Pulse: 78  Height: 5\' 11"  (1.803 m)  Weight: 240 lb 9.6 oz (109.135 kg)    GEN- The patient is well appearing, alert and oriented x 3 today.   Head- normocephalic, atraumatic Eyes-  Sclera clear, conjunctiva pink Ears- hearing intact Oropharynx- clear Lungs- Clear to ausculation bilaterally, normal work of breathing Chest- pacemaker pocket is well healed Heart- Regular rate and rhythm, no murmurs, rubs or gallops, PMI not laterally displaced GI- soft, NT, ND, + BS Extremities- no clubbing, cyanosis, or edema  Pacemaker interrogation- reviewed in detail today,  See PACEART report  Assessment and Plan:  1. Second degree AV block Normal pacemaker function See Pace Art report AV delay adjusted today to see if this would improve his SOB.  2. Maintaining sinus rhythm off of amiodarone Continue anticoagulation long term  3. htn Stable No change required today  I will see in 6  months He will follow with Dr Jacinto Halim as scheduled.

## 2012-10-19 ENCOUNTER — Encounter (HOSPITAL_COMMUNITY): Payer: Self-pay | Admitting: Respiratory Therapy

## 2012-10-27 ENCOUNTER — Encounter (HOSPITAL_COMMUNITY)
Admission: RE | Disposition: A | Discharge status: Home / Self Care | Payer: Self-pay | Source: Ambulatory Visit | Attending: Cardiology

## 2012-10-27 ENCOUNTER — Ambulatory Visit (HOSPITAL_COMMUNITY)
Admission: RE | Admit: 2012-10-27 | Discharge: 2012-10-27 | Disposition: A | Discharge status: Home / Self Care | Payer: BC Managed Care – PPO | Source: Ambulatory Visit | Attending: Cardiology | Admitting: Cardiology

## 2012-10-27 DIAGNOSIS — I509 Heart failure, unspecified: Secondary | ICD-10-CM | POA: Insufficient documentation

## 2012-10-27 DIAGNOSIS — Z950 Presence of cardiac pacemaker: Secondary | ICD-10-CM | POA: Insufficient documentation

## 2012-10-27 DIAGNOSIS — Z823 Family history of stroke: Secondary | ICD-10-CM | POA: Insufficient documentation

## 2012-10-27 DIAGNOSIS — Z8249 Family history of ischemic heart disease and other diseases of the circulatory system: Secondary | ICD-10-CM | POA: Insufficient documentation

## 2012-10-27 DIAGNOSIS — Z79899 Other long term (current) drug therapy: Secondary | ICD-10-CM | POA: Insufficient documentation

## 2012-10-27 DIAGNOSIS — Z7982 Long term (current) use of aspirin: Secondary | ICD-10-CM | POA: Insufficient documentation

## 2012-10-27 DIAGNOSIS — I359 Nonrheumatic aortic valve disorder, unspecified: Secondary | ICD-10-CM | POA: Insufficient documentation

## 2012-10-27 DIAGNOSIS — N189 Chronic kidney disease, unspecified: Secondary | ICD-10-CM | POA: Insufficient documentation

## 2012-10-27 DIAGNOSIS — I129 Hypertensive chronic kidney disease with stage 1 through stage 4 chronic kidney disease, or unspecified chronic kidney disease: Secondary | ICD-10-CM | POA: Insufficient documentation

## 2012-10-27 DIAGNOSIS — I5032 Chronic diastolic (congestive) heart failure: Secondary | ICD-10-CM | POA: Insufficient documentation

## 2012-10-27 DIAGNOSIS — E119 Type 2 diabetes mellitus without complications: Secondary | ICD-10-CM | POA: Insufficient documentation

## 2012-10-27 DIAGNOSIS — I495 Sick sinus syndrome: Secondary | ICD-10-CM | POA: Insufficient documentation

## 2012-10-27 DIAGNOSIS — I2789 Other specified pulmonary heart diseases: Secondary | ICD-10-CM | POA: Insufficient documentation

## 2012-10-27 DIAGNOSIS — R079 Chest pain, unspecified: Secondary | ICD-10-CM | POA: Insufficient documentation

## 2012-10-27 DIAGNOSIS — Z7901 Long term (current) use of anticoagulants: Secondary | ICD-10-CM | POA: Insufficient documentation

## 2012-10-27 DIAGNOSIS — E785 Hyperlipidemia, unspecified: Secondary | ICD-10-CM | POA: Insufficient documentation

## 2012-10-27 HISTORY — PX: LEFT AND RIGHT HEART CATHETERIZATION WITH CORONARY ANGIOGRAM: SHX5449

## 2012-10-27 LAB — POCT I-STAT 3, VENOUS BLOOD GAS (G3P V)
Acid-Base Excess: 5 mmol/L — ABNORMAL HIGH (ref 0.0–2.0)
Bicarbonate: 30.3 mEq/L — ABNORMAL HIGH (ref 20.0–24.0)
O2 Saturation: 72 %
TCO2: 32 mmol/L (ref 0–100)
pCO2, Ven: 46.1 mmHg (ref 45.0–50.0)
pH, Ven: 7.426 — ABNORMAL HIGH (ref 7.250–7.300)
pO2, Ven: 37 mmHg (ref 30.0–45.0)

## 2012-10-27 SURGERY — LEFT AND RIGHT HEART CATHETERIZATION WITH CORONARY ANGIOGRAM
Anesthesia: LOCAL

## 2012-10-27 MED ORDER — SODIUM CHLORIDE 0.9 % IJ SOLN: 3.0000 mL | INTRAMUSCULAR | Status: DC | PRN

## 2012-10-27 MED ORDER — NITROGLYCERIN 0.2 MG/ML ON CALL CATH LAB
INTRAVENOUS | Status: AC
  Filled 2012-10-27: qty 1

## 2012-10-27 MED ORDER — HEPARIN SODIUM (PORCINE) 1000 UNIT/ML IJ SOLN
INTRAMUSCULAR | Status: AC
  Filled 2012-10-27: qty 1

## 2012-10-27 MED ORDER — ONDANSETRON HCL 4 MG/2ML IJ SOLN: 4.0000 mg | Freq: Four times a day (QID) | INTRAMUSCULAR | Status: DC | PRN

## 2012-10-27 MED ORDER — SODIUM CHLORIDE 0.9 % IV SOLN: 1.0000 mL/kg/h | INTRAVENOUS | Status: DC

## 2012-10-27 MED ORDER — VERAPAMIL HCL 2.5 MG/ML IV SOLN
INTRAVENOUS | Status: AC
  Filled 2012-10-27: qty 2

## 2012-10-27 MED ORDER — ASPIRIN 81 MG PO CHEW
324.0000 mg | CHEWABLE_TABLET | ORAL | Status: AC
  Administered 2012-10-27: 324 mg via ORAL

## 2012-10-27 MED ORDER — FENTANYL CITRATE 0.05 MG/ML IJ SOLN
INTRAMUSCULAR | Status: AC
  Filled 2012-10-27: qty 2

## 2012-10-27 MED ORDER — SODIUM CHLORIDE 0.9 % IJ SOLN: 3.0000 mL | Freq: Two times a day (BID) | INTRAMUSCULAR | Status: DC

## 2012-10-27 MED ORDER — ACETAMINOPHEN 325 MG PO TABS: 650.0000 mg | ORAL_TABLET | ORAL | Status: DC | PRN

## 2012-10-27 MED ORDER — ASPIRIN 81 MG PO CHEW
CHEWABLE_TABLET | ORAL | Status: AC
  Filled 2012-10-27: qty 4

## 2012-10-27 MED ORDER — LIDOCAINE HCL (PF) 1 % IJ SOLN
INTRAMUSCULAR | Status: AC
  Filled 2012-10-27: qty 30

## 2012-10-27 MED ORDER — MIDAZOLAM HCL 2 MG/2ML IJ SOLN
INTRAMUSCULAR | Status: AC
  Filled 2012-10-27: qty 2

## 2012-10-27 MED ORDER — SODIUM CHLORIDE 0.9 % IV SOLN: 250.0000 mL | INTRAVENOUS | Status: DC | PRN

## 2012-10-27 MED ORDER — SODIUM CHLORIDE 0.9 % IV SOLN
INTRAVENOUS | Status: DC
  Administered 2012-10-27: 06:00:00 via INTRAVENOUS

## 2012-10-27 MED ORDER — HEPARIN (PORCINE) IN NACL 2-0.9 UNIT/ML-% IJ SOLN
INTRAMUSCULAR | Status: AC
  Filled 2012-10-27: qty 1000

## 2012-10-27 NOTE — Interval H&P Note (Signed)
History and Physical Interval Note:  10/27/2012 6:38 AM  [name]  has presented today for surgery, with the diagnosis of Chest pain  The various methods of treatment have been discussed with the patient and family. After consideration of risks, benefits and other options for treatment, the patient has consented to  Procedure(s): LEFT AND RIGHT HEART CATHETERIZATION WITH CORONARY ANGIOGRAM (N/A) and possible PTCA as a surgical intervention .  The patient's history has been reviewed, patient examined, no change in status, stable for surgery.  I have reviewed the patient's chart and labs.  Questions were answered to the patient's satisfaction.     Pamella Pert

## 2012-10-27 NOTE — CV Procedure (Signed)
Procedures performed: Right and left heart catheterization and occlusion of cardiac output and cardiac index by Fick. Right radial arterial access and right antecubital vein access was utilized for performing the procedure.   Indication: Patient is a 62 year-old male with diabetes, hyperlipidemia, hypertension,  morbid obesity  And mild aortic stenosis Presenting with dyspnea. Outpatient stress testing a year ago was low risk without ischemia. Due to repeated OP evaluation and worsening dyspnea is brought to the cardiac catheterization lab to evaluate  coronary anatomy. Right heart catheterization being performed for evaluation of dyspnea and pulmonary hypertension and to evaluate cardiac output and cardiac index.  A 5 French  sheath introduced into right anterior cubital vein access. A 5 French Swan-Ganz catheter was advanced with balloon inflated on the sheath under fluoroscopic guidance into first the right atrium followed by the right ventricle and into the pulmonary artery to pulmonary artery wedge position. Hemodynamics were obtained in a locations.  After hemodynamics were completed, samples were taken for SaO2% measurement to be used in [facility] /Index catheterization.  The catheter was then pulled back the balloon down and then completely out of the body. A 5 French right radial arterial access was and left heart catheterization was performed via right radial arterial access in the usual fashion. A 5 French tig 4 catheter was utilized to perform selective right and left coronary arteriography.  Left Heart Catheterization   First a 5 [place] TIG 4  catheter was advanced over standard J-wire into the ascending aorta and used to engage first the Left and Right Coronary Artery. Multiple cineangiographic views of the Left then Right Coronary Artery system(s) were performed.  A 5  French angled pigtail catheter and 6 F AL-1was used to attempt to cross the aortic valve for measurement Left Ventricular  Hemodynamics. In spite of using these catheters, a Glidewire, I was unable to cross the valve. The valve was mildly calcified. With a low suspicion for critical aortic stenosis, the aortic valve was not crossed.The catheter was then removed the body over wire. All exchanges were made over standard J wire.   Procedural data:  RA pressure 14/11  Mean 10 mm mercury. RA saturation NA.  RV pressure 48/0 and Right ventricular EDP 13 mm Hg. PA pressure 51/14 with a mean of 40  mm mercury. PA saturation 72%.  Pulmonary capillary wedge 23/32 with a mean of 23 mm Hg. Aortic saturation 97%.  Cardiac output was 6.84 with cardiac index of 3.06  by Fick. Normal.  Angiographic data  Left ventricle: Did not cross the AV.  Right coronary artery: Smooth,Co-dominant and normal   Left main coronary artery: Normal. No stenosis.   LAD: Large, smooth and normal. Gives origin to a moderate sized diagonal 1 which is smooth and normal.   Circumflex coronary artery: Smooth with a ostial 10-20% stenosis. It is co-dominant. OM1 is moderate sized, smooth and normal.  Ramus intermediate: Moderate caliber vessel, ostial 10-20% stenosis, otherwise smooth and normal.  Impression: Normal left heart catheterizaton with 10-20% stenosis in the ramus intermediate and ostial circumflex coronary artery, the stenosis could be related to angle takeoff as the rest of the vessels are completely normal without any high-grade stenosis of luminal irregularity. Moderate pulmonary hypertension could explain his dyspnea and is related to obesity hypoventilation syndrome, primary pulmonary hypertension probably unlikely. He is preserved cardiac output and cardiac index, the left frontal and basilar pressure is probably markedly elevated as evidenced by elevated wedge pressure. Recommend diuresis, weight loss, and  could certainly consider therapy for pulmonary hypertension to see if his symptoms would improve.

## 2012-10-27 NOTE — H&P (Signed)
  Please see office visit notes for complete details of HPI.  

## 2012-10-29 ENCOUNTER — Institutional Professional Consult (permissible substitution): Payer: BC Managed Care – PPO | Admitting: Internal Medicine

## 2013-01-11 ENCOUNTER — Encounter: Payer: BC Managed Care – PPO | Admitting: *Deleted

## 2013-01-15 ENCOUNTER — Encounter: Payer: Self-pay | Admitting: *Deleted

## 2013-01-18 ENCOUNTER — Encounter: Payer: Self-pay | Admitting: *Deleted

## 2013-05-05 ENCOUNTER — Encounter: Payer: Self-pay | Admitting: Internal Medicine

## 2013-05-05 ENCOUNTER — Encounter (INDEPENDENT_AMBULATORY_CARE_PROVIDER_SITE_OTHER): Payer: Self-pay

## 2013-05-05 ENCOUNTER — Ambulatory Visit (INDEPENDENT_AMBULATORY_CARE_PROVIDER_SITE_OTHER): Payer: BC Managed Care – PPO | Admitting: Internal Medicine

## 2013-05-05 VITALS — BP 174/87 | HR 76 | Ht 71.0 in | Wt 236.0 lb

## 2013-05-05 DIAGNOSIS — I441 Atrioventricular block, second degree: Secondary | ICD-10-CM

## 2013-05-05 DIAGNOSIS — Z950 Presence of cardiac pacemaker: Secondary | ICD-10-CM

## 2013-05-05 DIAGNOSIS — I10 Essential (primary) hypertension: Secondary | ICD-10-CM

## 2013-05-05 DIAGNOSIS — I4891 Unspecified atrial fibrillation: Secondary | ICD-10-CM

## 2013-05-05 DIAGNOSIS — R0602 Shortness of breath: Secondary | ICD-10-CM

## 2013-05-05 DIAGNOSIS — I440 Atrioventricular block, first degree: Secondary | ICD-10-CM

## 2013-05-05 LAB — MDC_IDC_ENUM_SESS_TYPE_INCLINIC
Battery Voltage: 2.93 V
Brady Statistic RA Percent Paced: 1.7 %
Brady Statistic RV Percent Paced: 99 %
Implantable Pulse Generator Model: 2210
Implantable Pulse Generator Serial Number: 7408461
Lead Channel Impedance Value: 390 Ohm
Lead Channel Impedance Value: 680 Ohm
Lead Channel Pacing Threshold Amplitude: 0.625 V
Lead Channel Pacing Threshold Amplitude: 1 V
Lead Channel Pacing Threshold Pulse Width: 0.5 ms
Lead Channel Pacing Threshold Pulse Width: 0.8 ms
Lead Channel Sensing Intrinsic Amplitude: 3.4 mV
Lead Channel Sensing Intrinsic Amplitude: 7.3 mV
Lead Channel Setting Pacing Amplitude: 1.5 V
Lead Channel Setting Pacing Amplitude: 2.5 V
Lead Channel Setting Pacing Pulse Width: 0.8 ms
Lead Channel Setting Sensing Sensitivity: 2 mV

## 2013-05-05 MED ORDER — CARVEDILOL 6.25 MG PO TABS: 6.2500 mg | ORAL_TABLET | Freq: Two times a day (BID) | ORAL | Status: DC

## 2013-05-05 NOTE — Progress Notes (Signed)
PCP: [name], MD Primary Cardiologist: [name] is a 63 y.o. male who presents today for routine electrophysiology followup.  Since last being seen in our clinic, the patient underwent left and right heart catheterization by Dr Einar Gip to look for cause of shortness of breath.  This demonstrated non obstructive coronary disease and moderate pulmonary hypertension.  He has had no atrial fibrillation noted on his device since last interrogation (except for 1 - 10 second episode)   Today, he denies symptoms of palpitations, chest pain,  lower extremity edema, dizziness, presyncope, or syncope.  The patient is otherwise without complaint today.   Past Medical History  Diagnosis Date  . Hypertension   . Hyperlipidemia   . Fibromyalgia   . Obesity   . Mitral regurgitation     trivial  . Aortic stenosis, mild   . Left atrial enlargement     LA size 59mm by echo 11/21/11  . Pacemaker 03/31/2012  . Heart murmur   . Asthma     "little bit" (03/31/2012)  . Exertional dyspnea   . Obstructive sleep apnea     mild by sleep study 2013  . Hypothyroidism     S/P radiation  . DJD (degenerative joint disease)   . Arthritis of hand     "just a little bit in both hands" (03/31/2012)  . Atrial fibrillation     dx '04; DCCV '04, placed on flecainide, failed DCCV 04/2010, flecainide stopped; s/p successful A.fib ablation 01/31/12  . First degree AV block     post ablation, ~4104ms  . Second degree AV block    Past Surgical History  Procedure Laterality Date  . Finger tendon repair  1980's    "right little finger" (03/31/2012)  . US echocardiography  08/07/2009    EF 55-60%  . Doppler echocardiography  03/11/2002    EF 70-75%  . Cardiovascular stress test  03/12/2002    EF 48%, NO EVIDENCE OF ISCHEMIA  . Tee without cardioversion  01/30/2012    Procedure: TRANSESOPHAGEAL ECHOCARDIOGRAM (TEE);  Surgeon: Larey Dresser, MD;  Location: [facility];  Service: Cardiovascular;  Laterality:  N/A;  ablation next day  . Insert / replace / remove pacemaker  03/31/2012    initial placement  . Atrial fibrillation ablation  01/30/2012    PVI by Dr. Rayann Heman  . Lumbar disc surgery  1980's X2;  2000's  . Cardioversion  01/2012; 03/31/2012    Current Outpatient Prescriptions  Medication Sig Dispense Refill  . aliskiren (TEKTURNA) 300 MG tablet Take 300 mg by mouth daily.      [name] amLODipine (NORVASC) 5 MG tablet Take 10 mg by mouth daily.       . Azilsartan Medoxomil (EDARBI) 80 MG TABS Take 80 mg by mouth daily.      . budesonide-formoterol (SYMBICORT) 160-4.5 MCG/ACT inhaler Inhale 2 puffs into the lungs 2 (two) times daily.      . Cholecalciferol (VITAMIN D) 2000 UNITS CAPS Take 2,000 Units by mouth daily.      . cyclobenzaprine (FLEXERIL) 10 MG tablet Take 10 mg by mouth daily.       . dabigatran (PRADAXA) 150 MG CAPS Take 150 mg by mouth every 12 (twelve) hours.      . DULoxetine (CYMBALTA) 60 MG capsule Take 60 mg by mouth daily.       [name] HYDROcodone-acetaminophen (NORCO/VICODIN) 5-325 MG per tablet Take 1 tablet by mouth every 8 (eight) hours as needed.       [name]  levothyroxine (SYNTHROID, LEVOTHROID) 150 MCG tablet Take 150 mcg by mouth daily before breakfast.      . Melatonin-Pyridoxine (MELATONEX) 3-10 MG TBCR Take 1 tablet by mouth at bedtime.      . pantoprazole (PROTONIX) 40 MG tablet Take 40 mg by mouth daily.      . rosuvastatin (CRESTOR) 10 MG tablet Take 10 mg by mouth daily.      . valACYclovir (VALTREX) 1000 MG tablet Take 1 tablet by mouth every 12 (twelve) hours as needed.      . zolpidem (AMBIEN) 10 MG tablet Take 10 mg by mouth at bedtime. For sleep      . carvedilol (COREG) 6.25 MG tablet Take 1 tablet (6.25 mg total) by mouth 2 (two) times daily.  180 tablet  3   No current facility-administered medications for this visit.    Physical Exam: Filed Vitals:   05/05/13 1037  BP: 174/87  Pulse: 76  Height: 5\' 11"  (1.803 m)  Weight: 236 lb (107.049 kg)    GEN- The  patient is well appearing, alert and oriented x 3 today.   Head- normocephalic, atraumatic Eyes-  Sclera clear, conjunctiva pink Ears- hearing intact Oropharynx- clear Lungs- Clear to ausculation bilaterally, normal work of breathing Chest- pacemaker pocket is well healed Heart- Regular rate and rhythm, 2/6 SEM LUSB (early peaking) GI- soft, NT, ND, + BS Extremities- no clubbing, cyanosis, or edema  Pacemaker interrogation- reviewed in detail today,  See PACEART report Cath reviewed  Assessment and Plan:  1. SOB Unclear etiology Given frequent V pacing, I will order echo AV optimization of his pacemaker Weight loss and regular exercise are encouraged  2. afib No afib post ablation Chads2vasc score is 1.  Stop ASA.  Consider stopping pradaxa if no further afib upon return  3. HTn Above goal Add coreg  4. Second degree AV block Normal pacemaker function See Pace Art report No changes today   Merlin Return to see me in 1 year Follow-up with Dr Einar Gip as scheduled

## 2013-05-05 NOTE — Patient Instructions (Addendum)
Remote monitoring is used to monitor your Pacemaker of ICD from home. This monitoring reduces the number of office visits required to check your device to one time per year. It allows [place] to keep an eye on the functioning of your device to ensure it is working properly. You are scheduled for a device check from home on August 04, 2013. You may send your transmission at any time that day. If you have a wireless device, the transmission will be sent automatically. After your physician reviews your transmission, you will receive a postcard with your next transmission date.  Your physician wants you to follow-up in: 12 months with Dr Vallery Ridge will receive a reminder letter in the mail two months in advance. If you don't receive a letter, please call our office to schedule the follow-up appointment.   Your physician has requested that you have an AV optimization echocardiogram. Echocardiography is a painless test that uses sound waves to create images of your heart. It provides your doctor with information about the size and shape of your heart and how well your heart's chambers and valves are working. This procedure takes approximately one hour. There are no restrictions for this procedure.  Your physician has recommended you make the following change in your medication:  1) Start  Carvedilol 6.25mg  twice daily 2) Stop Aspirin

## 2013-05-19 ENCOUNTER — Ambulatory Visit (HOSPITAL_COMMUNITY): Payer: BC Managed Care – PPO | Attending: Cardiology | Admitting: Cardiology

## 2013-05-19 ENCOUNTER — Encounter: Payer: Self-pay | Admitting: Cardiology

## 2013-05-19 DIAGNOSIS — I440 Atrioventricular block, first degree: Secondary | ICD-10-CM

## 2013-05-19 DIAGNOSIS — I4891 Unspecified atrial fibrillation: Secondary | ICD-10-CM

## 2013-05-19 DIAGNOSIS — I059 Rheumatic mitral valve disease, unspecified: Secondary | ICD-10-CM | POA: Insufficient documentation

## 2013-05-19 DIAGNOSIS — R0609 Other forms of dyspnea: Secondary | ICD-10-CM | POA: Insufficient documentation

## 2013-05-19 DIAGNOSIS — Z950 Presence of cardiac pacemaker: Secondary | ICD-10-CM

## 2013-05-19 DIAGNOSIS — R0989 Other specified symptoms and signs involving the circulatory and respiratory systems: Principal | ICD-10-CM | POA: Insufficient documentation

## 2013-05-19 DIAGNOSIS — E785 Hyperlipidemia, unspecified: Secondary | ICD-10-CM | POA: Insufficient documentation

## 2013-05-19 DIAGNOSIS — R011 Cardiac murmur, unspecified: Secondary | ICD-10-CM

## 2013-05-19 DIAGNOSIS — I10 Essential (primary) hypertension: Secondary | ICD-10-CM | POA: Insufficient documentation

## 2013-05-19 NOTE — Progress Notes (Signed)
Echo performed. 

## 2013-06-30 ENCOUNTER — Encounter: Payer: BC Managed Care – PPO | Admitting: Internal Medicine

## 2013-07-08 ENCOUNTER — Ambulatory Visit (INDEPENDENT_AMBULATORY_CARE_PROVIDER_SITE_OTHER): Payer: BC Managed Care – PPO | Admitting: Internal Medicine

## 2013-07-08 ENCOUNTER — Encounter: Payer: Self-pay | Admitting: Internal Medicine

## 2013-07-08 VITALS — BP 188/94 | HR 75 | Ht 71.0 in | Wt 229.0 lb

## 2013-07-08 DIAGNOSIS — I498 Other specified cardiac arrhythmias: Secondary | ICD-10-CM

## 2013-07-08 DIAGNOSIS — I440 Atrioventricular block, first degree: Secondary | ICD-10-CM

## 2013-07-08 DIAGNOSIS — I4891 Unspecified atrial fibrillation: Secondary | ICD-10-CM

## 2013-07-08 DIAGNOSIS — I441 Atrioventricular block, second degree: Secondary | ICD-10-CM

## 2013-07-08 DIAGNOSIS — R0602 Shortness of breath: Secondary | ICD-10-CM

## 2013-07-08 DIAGNOSIS — I10 Essential (primary) hypertension: Secondary | ICD-10-CM

## 2013-07-08 DIAGNOSIS — R001 Bradycardia, unspecified: Secondary | ICD-10-CM

## 2013-07-08 NOTE — Progress Notes (Signed)
PCP: [name], MD Primary Cardiologist: Pieter Fooks is a 63 y.o. male who presents today for routine electrophysiology followup.  Since last being seen in our clinic, the patient reports doing reasonably well.  He has persistent shortness of breath and exercise intolerance.  He feels this is stable.  AV optimization undertaken in Jan 2015 did not result in any significant improvement. He had cath performed by Dr Einar Gip.  This revealed pulmonary hypertension.  Today, he denies symptoms of palpitations, chest pain,  lower extremity edema, dizziness, presyncope, or syncope.  The patient is otherwise without complaint today.   Past Medical History  Diagnosis Date  . Hypertension   . Hyperlipidemia   . Fibromyalgia   . Obesity   . Mitral regurgitation     trivial  . Aortic stenosis, mild   . Left atrial enlargement     LA size 36mm by echo 11/21/11  . Pacemaker 03/31/2012  . Heart murmur   . Asthma     "little bit" (03/31/2012)  . Exertional dyspnea   . Obstructive sleep apnea     mild by sleep study 2013  . Hypothyroidism     S/P radiation  . DJD (degenerative joint disease)   . Arthritis of hand     "just a little bit in both hands" (03/31/2012)  . Atrial fibrillation     dx '04; DCCV '04, placed on flecainide, failed DCCV 04/2010, flecainide stopped; s/p successful A.fib ablation 01/31/12  . First degree AV block     post ablation, ~412ms  . Second degree AV block    Past Surgical History  Procedure Laterality Date  . Finger tendon repair  1980's    "right little finger" (03/31/2012)  . US echocardiography  08/07/2009    EF 55-60%  . Doppler echocardiography  03/11/2002    EF 70-75%  . Cardiovascular stress test  03/12/2002    EF 48%, NO EVIDENCE OF ISCHEMIA  . Tee without cardioversion  01/30/2012    Procedure: TRANSESOPHAGEAL ECHOCARDIOGRAM (TEE);  Surgeon: Larey Dresser, MD;  Location: [place];  Service: Cardiovascular;  Laterality: N/A;  ablation next day   . Insert / replace / remove pacemaker  03/31/2012    initial placement  . Atrial fibrillation ablation  01/30/2012    PVI by Dr. Rayann Heman  . Lumbar disc surgery  1980's X2;  2000's  . Cardioversion  01/2012; 03/31/2012    Current Outpatient Prescriptions  Medication Sig Dispense Refill  . aliskiren (TEKTURNA) 300 MG tablet Take 300 mg by mouth daily.      [name] amLODipine (NORVASC) 5 MG tablet Take 10 mg by mouth daily.       . Azilsartan Medoxomil (EDARBI) 80 MG TABS Take 80 mg by mouth daily.      . budesonide-formoterol (SYMBICORT) 160-4.5 MCG/ACT inhaler Inhale 2 puffs into the lungs 2 (two) times daily.      . carvedilol (COREG) 6.25 MG tablet Take 1 tablet (6.25 mg total) by mouth 2 (two) times daily.  180 tablet  3  . Cholecalciferol (VITAMIN D) 2000 UNITS CAPS Take 2,000 Units by mouth daily.      . cyclobenzaprine (FLEXERIL) 10 MG tablet Take 10 mg by mouth daily.       . dabigatran (PRADAXA) 150 MG CAPS Take 150 mg by mouth every 12 (twelve) hours.      . DULoxetine (CYMBALTA) 60 MG capsule Take 60 mg by mouth daily.       [name] HYDROcodone-acetaminophen (  NORCO/VICODIN) 5-325 MG per tablet Take 1 tablet by mouth every 8 (eight) hours as needed.       [name] levothyroxine (SYNTHROID, LEVOTHROID) 150 MCG tablet Take 150 mcg by mouth daily before breakfast.      . Melatonin-Pyridoxine (MELATONEX) 3-10 MG TBCR Take 1 tablet by mouth at bedtime.      . ONE TOUCH ULTRA TEST test strip       . pantoprazole (PROTONIX) 40 MG tablet Take 40 mg by mouth daily.      [name] rOPINIRole (REQUIP) 0.5 MG tablet daily.      . rosuvastatin (CRESTOR) 10 MG tablet Take 10 mg by mouth daily.      . valACYclovir (VALTREX) 1000 MG tablet Take 1 tablet by mouth every 12 (twelve) hours as needed.      . zolpidem (AMBIEN) 10 MG tablet Take 10 mg by mouth at bedtime. For sleep       No current facility-administered medications for this visit.    Physical Exam: Filed Vitals:   07/08/13 1110  BP: 188/94  Pulse: 75   Height: 5\' 11"  (1.803 m)  Weight: 229 lb (103.874 kg)    GEN- The patient is well appearing, alert and oriented x 3 today.   Head- normocephalic, atraumatic Eyes-  Sclera clear, conjunctiva pink Ears- hearing intact Oropharynx- clear Lungs- Clear to ausculation bilaterally, normal work of breathing Chest- pacemaker pocket is well healed Heart- Regular rate and rhythm, no murmurs, rubs or gallops, PMI not laterally displaced GI- soft, NT, ND, + BS Extremities- no clubbing, cyanosis, or edema  Pacemaker interrogation- reviewed in detail today,  See PACEART report  Assessment and Plan:  1. Second degree AV block Normal pacemaker function See Pace Art report No changes today  2. SOB Unclear etiology Given pulmonary hypertension, I think that additional workup is required.  I will obtain a cardiac CT to evaluate for pulmonary vein stenosis.  I will also obtain pfts to evaluate for intrinsic lung disease.  Finally, I think that a CPX can be helpful to better determine degree of cardiac, pulmonary, deconditioning, etc components to his SOB.    3. afib Maintaining sinus rhythm Continue current regimen Chads2vasc score is 1. Stop ASA. Consider stopping pradaxa if no further afib upon return  4. htn Repeat BP is 156/80 Weight loss and sodium restriction is advised  5. Obesity Weight loss is advised  Return in 3 months for follow-up Follow-up with Dr Einar Gip as scheduled

## 2013-07-08 NOTE — Patient Instructions (Addendum)
Your physician recommends that you schedule a follow-up appointment in: 3 months with Dr Rayann Heman  Your physician has recommended that you have a cardiopulmonary stress test (CPX). CPX testing is a non-invasive measurement of heart and lung function. It replaces a traditional treadmill stress test. This type of test provides a tremendous amount of information that relates not only to your present condition but also for future outcomes. This test combines measurements of you ventilation, respiratory gas exchange in the lungs, electrocardiogram (EKG), blood pressure and physical response before, during, and following an exercise protocol.  Your physician has recommended that you have a pulmonary function test. Pulmonary Function Tests are a group of tests that measure how well air moves in and out of your lungs.  Your physician has requested that you have cardiac CT. Cardiac computed tomography (CT) is a painless test that uses an x-ray machine to take clear, detailed pictures of your heart. For further information please visit [email or web address]. Please follow instruction sheet as given.--- evaluate pulmonary vein stenosis

## 2013-07-11 LAB — MDC_IDC_ENUM_SESS_TYPE_INCLINIC
Battery Voltage: 2.93 V
Brady Statistic RA Percent Paced: 2.2 %
Brady Statistic RV Percent Paced: 98 %
Implantable Pulse Generator Model: 2210
Implantable Pulse Generator Serial Number: 7408461
Lead Channel Impedance Value: 410 Ohm
Lead Channel Impedance Value: 660 Ohm
Lead Channel Pacing Threshold Amplitude: 0.875 V
Lead Channel Pacing Threshold Amplitude: 1 V
Lead Channel Pacing Threshold Pulse Width: 0.5 ms
Lead Channel Pacing Threshold Pulse Width: 0.8 ms
Lead Channel Sensing Intrinsic Amplitude: 3.7 mV
Lead Channel Sensing Intrinsic Amplitude: 8.2 mV
Lead Channel Setting Pacing Amplitude: 1.5 V
Lead Channel Setting Pacing Amplitude: 2.5 V
Lead Channel Setting Pacing Pulse Width: 0.8 ms
Lead Channel Setting Sensing Sensitivity: 2 mV

## 2013-07-12 ENCOUNTER — Telehealth: Payer: Self-pay | Admitting: Internal Medicine

## 2013-07-12 DIAGNOSIS — R0602 Shortness of breath: Secondary | ICD-10-CM

## 2013-07-12 NOTE — Telephone Encounter (Signed)
This will need to be pre-certified with pt's insurance and scheduled on day MD is in hospital to read.  I spoke with pt's wife and gave her this information.  She will wait for call from schedulers with appt time.  Pt also needs BMP. He will to office on July 14, 2013 for lab work.

## 2013-07-12 NOTE — Telephone Encounter (Signed)
New message         Pt would like to set up CT scan at the hospital for this Wednesday afternoon

## 2013-07-13 NOTE — Telephone Encounter (Signed)
Ivin Booty is working on getting this pre-certed with insurance and will call patient once approved

## 2013-07-14 ENCOUNTER — Other Ambulatory Visit: Payer: BC Managed Care – PPO

## 2013-07-14 ENCOUNTER — Ambulatory Visit (HOSPITAL_COMMUNITY): Payer: BC Managed Care – PPO | Attending: Internal Medicine

## 2013-07-14 ENCOUNTER — Other Ambulatory Visit (INDEPENDENT_AMBULATORY_CARE_PROVIDER_SITE_OTHER): Payer: BC Managed Care – PPO

## 2013-07-14 DIAGNOSIS — I4891 Unspecified atrial fibrillation: Secondary | ICD-10-CM | POA: Insufficient documentation

## 2013-07-14 DIAGNOSIS — R0602 Shortness of breath: Secondary | ICD-10-CM | POA: Insufficient documentation

## 2013-07-14 LAB — BASIC METABOLIC PANEL
BUN: 20 mg/dL (ref 6–23)
CO2: 30 mEq/L (ref 19–32)
Calcium: 8.9 mg/dL (ref 8.4–10.5)
Chloride: 100 mEq/L (ref 96–112)
Creatinine, Ser: 1.1 mg/dL (ref 0.4–1.5)
GFR: 71.59 mL/min (ref >60.00)
Glucose, Bld: 141 mg/dL — ABNORMAL HIGH (ref 70–99)
Potassium: 3.3 mEq/L — ABNORMAL LOW (ref 3.5–5.1)
Sodium: 137 mEq/L (ref 135–145)

## 2013-07-22 ENCOUNTER — Encounter: Payer: Self-pay | Admitting: Internal Medicine

## 2013-07-28 ENCOUNTER — Ambulatory Visit (HOSPITAL_COMMUNITY)
Admission: RE | Admit: 2013-07-28 | Discharge: 2013-07-28 | Disposition: A | Discharge status: Home / Self Care | Payer: BC Managed Care – PPO | Source: Ambulatory Visit | Attending: Internal Medicine | Admitting: Internal Medicine

## 2013-07-28 ENCOUNTER — Encounter (HOSPITAL_COMMUNITY): Payer: Self-pay

## 2013-07-28 DIAGNOSIS — I4891 Unspecified atrial fibrillation: Secondary | ICD-10-CM | POA: Insufficient documentation

## 2013-07-28 DIAGNOSIS — R0602 Shortness of breath: Secondary | ICD-10-CM

## 2013-07-28 MED ORDER — METOPROLOL TARTRATE 1 MG/ML IV SOLN
INTRAVENOUS | Status: AC
  Filled 2013-07-28: qty 5

## 2013-07-28 MED ORDER — IOHEXOL 350 MG/ML SOLN
80.0000 mL | Freq: Once | INTRAVENOUS | Status: AC | PRN
  Administered 2013-07-28: 80 mL via INTRAVENOUS

## 2013-07-28 MED ORDER — METOPROLOL TARTRATE 1 MG/ML IV SOLN
5.0000 mg | Freq: Once | INTRAVENOUS | Status: AC
  Administered 2013-07-28: 5 mg via INTRAVENOUS
  Filled 2013-07-28: qty 5

## 2013-08-05 ENCOUNTER — Ambulatory Visit (INDEPENDENT_AMBULATORY_CARE_PROVIDER_SITE_OTHER): Payer: BC Managed Care – PPO | Admitting: Internal Medicine

## 2013-08-05 DIAGNOSIS — I4891 Unspecified atrial fibrillation: Secondary | ICD-10-CM

## 2013-08-05 DIAGNOSIS — R0602 Shortness of breath: Secondary | ICD-10-CM

## 2013-08-05 LAB — PULMONARY FUNCTION TEST
DL/VA % pred: 89 %
DL/VA: 4.1 ml/min/mmHg/L
DLCO unc % pred: 61 %
DLCO unc: 19.93 ml/min/mmHg
FEF 25-75 Post: 1.8 L/sec
FEF 25-75 Pre: 1.05 L/sec
FEF2575-%Change-Post: 71 %
FEF2575-%Pred-Post: 65 %
FEF2575-%Pred-Pre: 37 %
FEV1-%Change-Post: 14 %
FEV1-%Pred-Post: 58 %
FEV1-%Pred-Pre: 51 %
FEV1-Post: 2.04 L
FEV1-Pre: 1.78 L
FEV1FVC-%Change-Post: 3 %
FEV1FVC-%Pred-Pre: 92 %
FEV6-%Change-Post: 10 %
FEV6-%Pred-Post: 63 %
FEV6-%Pred-Pre: 57 %
FEV6-Post: 2.81 L
FEV6-Pre: 2.53 L
FEV6FVC-%Change-Post: 0 %
FEV6FVC-%Pred-Post: 104 %
FEV6FVC-%Pred-Pre: 104 %
FVC-%Change-Post: 10 %
FVC-%Pred-Post: 61 %
FVC-%Pred-Pre: 55 %
FVC-Post: 2.84 L
FVC-Pre: 2.56 L
Post FEV1/FVC ratio: 72 %
Post FEV6/FVC ratio: 99 %
Pre FEV1/FVC ratio: 70 %
Pre FEV6/FVC Ratio: 99 %
RV % pred: 104 %
RV: 2.44 L
TLC % pred: 72 %
TLC: 5.09 L

## 2013-08-05 NOTE — Progress Notes (Signed)
PFT done today. 

## 2013-08-06 ENCOUNTER — Telehealth: Payer: Self-pay | Admitting: *Deleted

## 2013-08-06 NOTE — Telephone Encounter (Signed)
Aware of PFT's

## 2013-08-19 ENCOUNTER — Telehealth: Payer: Self-pay | Admitting: *Deleted

## 2013-08-19 DIAGNOSIS — R942 Abnormal results of pulmonary function studies: Secondary | ICD-10-CM

## 2013-08-19 NOTE — Telephone Encounter (Signed)
Wife aware and will call if she does not here about pulmonary appointment

## 2013-08-23 ENCOUNTER — Other Ambulatory Visit: Payer: Self-pay | Admitting: *Deleted

## 2013-09-15 ENCOUNTER — Ambulatory Visit (INDEPENDENT_AMBULATORY_CARE_PROVIDER_SITE_OTHER): Payer: BC Managed Care – PPO | Admitting: Emergency Medicine

## 2013-09-15 ENCOUNTER — Encounter (INDEPENDENT_AMBULATORY_CARE_PROVIDER_SITE_OTHER): Payer: Self-pay

## 2013-09-15 ENCOUNTER — Encounter: Payer: Self-pay | Admitting: Emergency Medicine

## 2013-09-15 VITALS — BP 140/82 | HR 63 | Ht 71.0 in | Wt 232.0 lb

## 2013-09-15 DIAGNOSIS — I272 Pulmonary hypertension, unspecified: Secondary | ICD-10-CM | POA: Insufficient documentation

## 2013-09-15 DIAGNOSIS — R0602 Shortness of breath: Secondary | ICD-10-CM

## 2013-09-15 DIAGNOSIS — I2789 Other specified pulmonary heart diseases: Secondary | ICD-10-CM

## 2013-09-15 DIAGNOSIS — R0600 Dyspnea, unspecified: Secondary | ICD-10-CM | POA: Insufficient documentation

## 2013-09-15 DIAGNOSIS — R0989 Other specified symptoms and signs involving the circulatory and respiratory systems: Secondary | ICD-10-CM

## 2013-09-15 DIAGNOSIS — R0609 Other forms of dyspnea: Secondary | ICD-10-CM

## 2013-09-15 NOTE — Progress Notes (Signed)
Subjective:    Patient ID: [name], male    DOB: 1950-10-03, 63 y.o.   MRN: [id number]  HPI 63 yo man, former smoker (7-8pk-yrs), hx obesity, HTN, mild AS, A Fib s/p ablation and rate control, mild OSA (by PSG '13), asthma dx several yrs ago by Dr Minna Antis. Has been experiencing exertional dyspnea for 2-3 yrs. He notices dyspnea with climbing a hill. He snores, moves his arms and legs continuously through the night. He has lost 20 lbs over last few months which has helped some.   Eval has included PFT that show mixed disease with a definite obstructive component based on a response to bronchodilator 08/05/13. Diffusion corrected for alveolar volume.  CPST and L/R caths as below.   CPST 07/14/13 >>  Conclusion: Exercise testing with gas exchange demonstrates a moderate functional limitation when compared to matched sedentary norms. The limitation to exercise appears multifactorial in nature due to his obesity and related restrictive lung physiology. There is also evidence of chronotropic incompetence and a mild circulatory limitation which may be related to diastolic dysfunction and pulmonary HTN. The limitation appears truly multifactorial and there does not appear to be one dominant factor at this point. Suspect his functional capacity may improve with weight loss and possible reduction in his AVN blockers.  Heart cath Dr Nadyne Coombes 7/14 >  Procedural data:  RA pressure 14/11 Mean 10 mm mercury. RA saturation NA.  RV pressure 48/0 and Right ventricular EDP 13 mm Hg.  PA pressure 51/14 with a mean of 40 mm mercury. PA saturation 72%.  Pulmonary capillary wedge 23/32 with a mean of 23 mm Hg. Aortic saturation 97%.  Cardiac output was 6.84 with cardiac index of 3.06 by Fick. Normal    Review of Systems  Constitutional: Negative for fever and unexpected weight change.  HENT: Positive for congestion and sinus pressure. Negative for dental problem, ear pain, nosebleeds, postnasal drip,  rhinorrhea, sneezing, sore throat and trouble swallowing.   Eyes: Negative for redness and itching.  Respiratory: Positive for cough and shortness of breath. Negative for chest tightness and wheezing.   Cardiovascular: Positive for palpitations. Negative for leg swelling.  Gastrointestinal: Negative for nausea and vomiting.  Genitourinary: Negative for dysuria.  Musculoskeletal: Positive for joint swelling.       Stiffness and pain Knee and hands  Skin: Negative for rash.  Neurological: Negative for headaches.  Hematological: Does not bruise/bleed easily.  Psychiatric/Behavioral: Negative for dysphoric mood. The patient is not nervous/anxious.    Past Medical History  Diagnosis Date  . Hypertension   . Hyperlipidemia   . Fibromyalgia   . Obesity   . Mitral regurgitation     trivial  . Aortic stenosis, mild   . Left atrial enlargement     LA size 42mm by echo 11/21/11  . Pacemaker 03/31/2012  . Heart murmur   . Asthma     "little bit" (03/31/2012)  . Exertional dyspnea   . Obstructive sleep apnea     mild by sleep study 2013  . Hypothyroidism     S/P radiation  . DJD (degenerative joint disease)   . Arthritis of hand     "just a little bit in both hands" (03/31/2012)  . Atrial fibrillation     dx '04; DCCV '04, placed on flecainide, failed DCCV 04/2010, flecainide stopped; s/p successful A.fib ablation 01/31/12  . First degree AV block     post ablation, ~442ms  . Second degree AV block  Family History  Problem Relation Age of Onset  . Heart failure Mother   . Stroke Father      History   Social History  . Marital Status: Married    Spouse Name: N/A    Number of Children: N/A  . Years of Education: N/A   Occupational History  . Not on file.   Social History Main Topics  . Smoking status: Former Smoker -- 0.50 packs/day for 15 years    Types: Cigarettes    Quit date: 07/18/1991  . Smokeless tobacco: Never Used  . Alcohol Use: Yes     Comment: 1-2 times a  year  . Drug Use: No  . Sexual Activity: Not Currently   Other Topics Concern  . Not on file   Social History Narrative   Lives in [place] with spouse.  Works as a Air traffic controller.     Allergies  Allergen Reactions  . Meloxicam Rash     Outpatient Prescriptions Prior to Visit  Medication Sig Dispense Refill  . aliskiren (TEKTURNA) 300 MG tablet Take 300 mg by mouth daily.      [name] amLODipine (NORVASC) 5 MG tablet Take 10 mg by mouth daily.       . Azilsartan Medoxomil (EDARBI) 80 MG TABS Take 80 mg by mouth daily.      . budesonide-formoterol (SYMBICORT) 160-4.5 MCG/ACT inhaler Inhale 2 puffs into the lungs 2 (two) times daily.      . carvedilol (COREG) 6.25 MG tablet Take 1 tablet (6.25 mg total) by mouth 2 (two) times daily.  180 tablet  3  . Cholecalciferol (VITAMIN D) 2000 UNITS CAPS Take 2,000 Units by mouth daily.      . cyclobenzaprine (FLEXERIL) 10 MG tablet Take 10 mg by mouth daily.       . dabigatran (PRADAXA) 150 MG CAPS Take 150 mg by mouth every 12 (twelve) hours.      . DULoxetine (CYMBALTA) 60 MG capsule Take 60 mg by mouth daily.       [name] HYDROcodone-acetaminophen (NORCO/VICODIN) 5-325 MG per tablet Take 1 tablet by mouth every 8 (eight) hours as needed.       [name] levothyroxine (SYNTHROID, LEVOTHROID) 150 MCG tablet Take 150 mcg by mouth daily before breakfast.      . Melatonin-Pyridoxine (MELATONEX) 3-10 MG TBCR Take 1 tablet by mouth at bedtime.      . pantoprazole (PROTONIX) 40 MG tablet Take 40 mg by mouth daily.      [name] rOPINIRole (REQUIP) 0.5 MG tablet daily.      . rosuvastatin (CRESTOR) 10 MG tablet Take 10 mg by mouth daily.      . valACYclovir (VALTREX) 1000 MG tablet Take 1 tablet by mouth every 12 (twelve) hours as needed.      . zolpidem (AMBIEN) 10 MG tablet Take 10 mg by mouth at bedtime. For sleep      . ONE TOUCH ULTRA TEST test strip        No facility-administered medications prior to visit.         Objective:   Physical Exam Filed  Vitals:   09/15/13 0909  BP: 140/82  Pulse: 63  Height: 5\' 11"  (1.803 m)  Weight: 232 lb (105.235 kg)  SpO2: 98%   Gen: Pleasant, well-nourished, in no distress,  normal affect  ENT: No lesions,  mouth clear,  oropharynx clear, no postnasal drip  Neck: No JVD, no TMG, no carotid bruits  Lungs: No use of accessory  muscles, clear without rales or rhonchi  Cardiovascular: RRR, heart sounds normal, no murmur or gallops, no peripheral edema  Abdomen: soft and NT, obese, umbilical hernia  Musculoskeletal: No deformities, no cyanosis or clubbing  Neuro: alert, non focal  Skin: Warm, no lesions or rashes           Assessment & Plan:  Pulmonary HTN 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 Suspected hypoventilation. Need to r/o hypoxemia during the day and while sleeping.  - check V/q scan to r/o chronic thromboembolic process - walking oximetry - nocturnal oximetry - low threshold to repeat his PSG  Dyspnea Multifactorial - deconditioning, asthma, possible contribution of PAH, chronotropic contribution now that he is paced.  - agree with symbicort for asthma - will assess for contributors to [facility] - CXR - could consider increasing his exercise HR on pacer - would need to discuss with Dr Rayann Heman.

## 2013-09-15 NOTE — Assessment & Plan Note (Signed)
Multifactorial - deconditioning, asthma, possible contribution of PAH, chronotropic contribution now that he is paced.  - agree with symbicort for asthma - will assess for contributors to [facility] - CXR - could consider increasing his exercise HR on pacer - would need to discuss with Dr Rayann Heman.

## 2013-09-15 NOTE — Assessment & Plan Note (Signed)
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 Suspected hypoventilation. Need to r/o hypoxemia during the day and while sleeping.  - check V/q scan to r/o chronic thromboembolic process - walking oximetry - nocturnal oximetry - low threshold to repeat his PSG

## 2013-09-15 NOTE — Patient Instructions (Signed)
We will increase your Requip to 1.0mg  every evening until your next visit We will perform a ventilation -perfusion scan  Please continue your Symbicort twice a day Use albuterol as needed  We will arrange for a nocturnal oximetry test on room air at home Follow with Dr Lamonte Sakai in 1 month

## 2013-09-30 ENCOUNTER — Encounter (HOSPITAL_COMMUNITY)
Admission: RE | Admit: 2013-09-30 | Discharge: 2013-09-30 | Disposition: A | Discharge status: Home / Self Care | Payer: BC Managed Care – PPO | Source: Ambulatory Visit | Attending: Emergency Medicine | Admitting: Emergency Medicine

## 2013-09-30 ENCOUNTER — Ambulatory Visit (HOSPITAL_COMMUNITY)
Admission: RE | Admit: 2013-09-30 | Discharge: 2013-09-30 | Disposition: A | Discharge status: Home / Self Care | Payer: BC Managed Care – PPO | Source: Ambulatory Visit | Attending: Emergency Medicine | Admitting: Emergency Medicine

## 2013-09-30 DIAGNOSIS — R0602 Shortness of breath: Secondary | ICD-10-CM

## 2013-09-30 DIAGNOSIS — R942 Abnormal results of pulmonary function studies: Secondary | ICD-10-CM | POA: Insufficient documentation

## 2013-09-30 DIAGNOSIS — R0989 Other specified symptoms and signs involving the circulatory and respiratory systems: Secondary | ICD-10-CM | POA: Insufficient documentation

## 2013-09-30 DIAGNOSIS — R059 Cough, unspecified: Secondary | ICD-10-CM | POA: Insufficient documentation

## 2013-09-30 DIAGNOSIS — R05 Cough: Secondary | ICD-10-CM | POA: Insufficient documentation

## 2013-09-30 MED ORDER — TECHNETIUM TC 99M DIETHYLENETRIAME-PENTAACETIC ACID
40.0000 | Freq: Once | INTRAVENOUS | Status: AC | PRN
  Administered 2013-09-30: 40 via RESPIRATORY_TRACT

## 2013-09-30 MED ORDER — TECHNETIUM TO 99M ALBUMIN AGGREGATED
5.0000 | Freq: Once | INTRAVENOUS | Status: AC | PRN
  Administered 2013-09-30: 5 via INTRAVENOUS

## 2013-10-06 ENCOUNTER — Telehealth: Payer: Self-pay | Admitting: Emergency Medicine

## 2013-10-06 NOTE — Telephone Encounter (Signed)
Advised pt's wife of VS scan results per RB.  She verbalized understanding and had no further questions

## 2013-10-13 ENCOUNTER — Telehealth: Payer: Self-pay | Admitting: Emergency Medicine

## 2013-10-13 ENCOUNTER — Ambulatory Visit (INDEPENDENT_AMBULATORY_CARE_PROVIDER_SITE_OTHER): Payer: BC Managed Care – PPO | Admitting: Emergency Medicine

## 2013-10-13 ENCOUNTER — Encounter: Payer: Self-pay | Admitting: Emergency Medicine

## 2013-10-13 VITALS — BP 128/78 | HR 75 | Ht 71.0 in | Wt 224.0 lb

## 2013-10-13 DIAGNOSIS — I272 Pulmonary hypertension, unspecified: Secondary | ICD-10-CM

## 2013-10-13 DIAGNOSIS — I2789 Other specified pulmonary heart diseases: Secondary | ICD-10-CM

## 2013-10-13 DIAGNOSIS — G4730 Sleep apnea, unspecified: Secondary | ICD-10-CM

## 2013-10-13 DIAGNOSIS — J45909 Unspecified asthma, uncomplicated: Secondary | ICD-10-CM | POA: Insufficient documentation

## 2013-10-13 MED ORDER — BUDESONIDE-FORMOTEROL FUMARATE 160-4.5 MCG/ACT IN AERO: 2.0000 | INHALATION_SPRAY | Freq: Two times a day (BID) | RESPIRATORY_TRACT | Status: DC

## 2013-10-13 MED ORDER — ROPINIROLE HCL 0.5 MG PO TABS: 1.0000 mg | ORAL_TABLET | Freq: Every day | ORAL | Status: DC

## 2013-10-13 MED ORDER — ALBUTEROL SULFATE HFA 108 (90 BASE) MCG/ACT IN AERS: 2.0000 | INHALATION_SPRAY | Freq: Four times a day (QID) | RESPIRATORY_TRACT | Status: DC | PRN

## 2013-10-13 NOTE — Telephone Encounter (Signed)
Attempted to call x1 LMTCB Advising that per RB O2 was good almost the entire night. Nothing needed at this time

## 2013-10-13 NOTE — Progress Notes (Signed)
Subjective:    Patient ID: [name], male    DOB: Aug 30, 1950, 63 y.o.   MRN: [id number]  Shortness of Breath Pertinent negatives include no ear pain, fever, headaches, leg swelling, rash, rhinorrhea, sore throat, vomiting or wheezing.   63 yo man, former smoker (7-8pk-yrs), hx obesity, HTN, mild AS, A Fib s/p ablation and rate control, mild OSA (by PSG '13), asthma dx several yrs ago by Dr Minna Antis. Has been experiencing exertional dyspnea for 2-3 yrs. He notices dyspnea with climbing a hill. He snores, moves his arms and legs continuously through the night. He has lost 20 lbs over last few months which has helped some.   Eval has included PFT that show mixed disease with a definite obstructive component based on a response to bronchodilator 08/05/13. Diffusion corrected for alveolar volume.  CPST and L/R caths as below.   ROV 10/13/13 -- follows up for SOB on exertion. He has asthma, secondary PAH.  He underwent low prob V/q scan, ONO, walking oximetry.  He has lost some wt. He has noticed that if he exerts himself he still has SOB, then the next day he feels bad, nauseated. He remains on symbicort, feels that it helps him. Rarely uses albuterol.   CPST 07/14/13 >>  Conclusion: Exercise testing with gas exchange demonstrates a moderate functional limitation when compared to matched sedentary norms. The limitation to exercise appears multifactorial in nature due to his obesity and related restrictive lung physiology. There is also evidence of chronotropic incompetence and a mild circulatory limitation which may be related to diastolic dysfunction and pulmonary HTN. The limitation appears truly multifactorial and there does not appear to be one dominant factor at this point. Suspect his functional capacity may improve with weight loss and possible reduction in his AVN blockers.  Heart cath Dr Nadyne Coombes 7/14 >  Procedural data:  RA pressure 14/11 Mean 10 mm mercury. RA saturation NA.  RV  pressure 48/0 and Right ventricular EDP 13 mm Hg.  PA pressure 51/14 with a mean of 40 mm mercury. PA saturation 72%.  Pulmonary capillary wedge 23/32 with a mean of 23 mm Hg. Aortic saturation 97%.  Cardiac output was 6.84 with cardiac index of 3.06 by Fick. Normal    Review of Systems  Constitutional: Negative for fever and unexpected weight change.  HENT: Positive for congestion and sinus pressure. Negative for dental problem, ear pain, nosebleeds, postnasal drip, rhinorrhea, sneezing, sore throat and trouble swallowing.   Eyes: Negative for redness and itching.  Respiratory: Positive for cough and shortness of breath. Negative for chest tightness and wheezing.   Cardiovascular: Positive for palpitations. Negative for leg swelling.  Gastrointestinal: Negative for nausea and vomiting.  Genitourinary: Negative for dysuria.  Musculoskeletal: Positive for joint swelling.       Stiffness and pain Knee and hands  Skin: Negative for rash.  Neurological: Negative for headaches.  Hematological: Does not bruise/bleed easily.  Psychiatric/Behavioral: Negative for dysphoric mood. The patient is not nervous/anxious.       Objective:   Physical Exam Filed Vitals:   10/13/13 1057  BP: 128/78  Pulse: 75  Height: 5\' 11"  (1.803 m)  Weight: 224 lb (101.606 kg)  SpO2: 97%   Gen: Pleasant, well-nourished, in no distress,  normal affect  ENT: No lesions,  mouth clear,  oropharynx clear, no postnasal drip  Neck: No JVD, no TMG, no carotid bruits  Lungs: No use of accessory muscles, clear without rales or rhonchi  Cardiovascular: RRR, heart  sounds normal, no murmur or gallops, no peripheral edema  Abdomen: soft and NT, obese, umbilical hernia  Musculoskeletal: No deformities, no cyanosis or clubbing  Neuro: alert, non focal  Skin: Warm, no lesions or rashes      Assessment & Plan:  Sleep apnea Will review his ONO. He is losing wt and I believe we can follow this fo rnow. He may  need a repeat PSG in the future.   Pulmonary HTN V/q scan reassuring. Multifactorial secondary PAH. We will continue to work on managing the underlying contributors.   Dyspnea Will continue to treat asthma, increase his SABA use and try to pretreat exercise. He may benefit from increasing his HR? I have asked him to discuss with Dr Rayann Heman at their next visit.   Intrinsic asthma Continue symbicort bid + albuterol prn.

## 2013-10-13 NOTE — Assessment & Plan Note (Signed)
Will review his ONO. He is losing wt and I believe we can follow this fo rnow. He may need a repeat PSG in the future.

## 2013-10-13 NOTE — Telephone Encounter (Signed)
Spoke with pt's wife and advised her of ONO results per RB. She verbalized understanding and had no further questions

## 2013-10-13 NOTE — Assessment & Plan Note (Signed)
V/q scan reassuring. Multifactorial secondary PAH. We will continue to work on managing the underlying contributors.

## 2013-10-13 NOTE — Assessment & Plan Note (Signed)
Will continue to treat asthma, increase his SABA use and try to pretreat exercise. He may benefit from increasing his HR? I have asked him to discuss with Dr Rayann Heman at their next visit.

## 2013-10-13 NOTE — Patient Instructions (Signed)
Please continue to work on weight loss.  We will hold off on a repeat sleep evaluation for now.  Continue your symbicort twice a day Use your albuterol 2 puffs as needed; you may want to consider pre-treating exercise or yardwork Follow with Dr Rayann Heman regarding your exercise heart rate on pacemaker as well as you cardiac medications.  Follow with Dr Lamonte Sakai in 4 months or sooner if you have any problems.

## 2013-10-13 NOTE — Assessment & Plan Note (Signed)
Continue symbicort bid + albuterol prn.

## 2013-10-20 ENCOUNTER — Encounter: Payer: Self-pay | Admitting: Internal Medicine

## 2013-10-20 ENCOUNTER — Ambulatory Visit (INDEPENDENT_AMBULATORY_CARE_PROVIDER_SITE_OTHER): Payer: BC Managed Care – PPO | Admitting: Internal Medicine

## 2013-10-20 VITALS — BP 144/76 | HR 64 | Ht 71.0 in | Wt 228.2 lb

## 2013-10-20 DIAGNOSIS — I498 Other specified cardiac arrhythmias: Secondary | ICD-10-CM

## 2013-10-20 DIAGNOSIS — R001 Bradycardia, unspecified: Secondary | ICD-10-CM

## 2013-10-20 DIAGNOSIS — I441 Atrioventricular block, second degree: Secondary | ICD-10-CM

## 2013-10-20 DIAGNOSIS — Z950 Presence of cardiac pacemaker: Secondary | ICD-10-CM

## 2013-10-20 DIAGNOSIS — I480 Paroxysmal atrial fibrillation: Secondary | ICD-10-CM

## 2013-10-20 DIAGNOSIS — I4891 Unspecified atrial fibrillation: Secondary | ICD-10-CM

## 2013-10-20 DIAGNOSIS — R0602 Shortness of breath: Secondary | ICD-10-CM

## 2013-10-20 DIAGNOSIS — I440 Atrioventricular block, first degree: Secondary | ICD-10-CM

## 2013-10-20 LAB — MDC_IDC_ENUM_SESS_TYPE_INCLINIC
Battery Remaining Longevity: 87.6 mo
Battery Voltage: 2.93 V
Brady Statistic RA Percent Paced: 6.7 %
Brady Statistic RV Percent Paced: 99.76 %
Date Time Interrogation Session: 20150624165945
Implantable Pulse Generator Model: 2210
Implantable Pulse Generator Serial Number: 7408461
Lead Channel Impedance Value: 425 Ohm
Lead Channel Impedance Value: 650 Ohm
Lead Channel Pacing Threshold Amplitude: 1 V
Lead Channel Pacing Threshold Amplitude: 1 V
Lead Channel Pacing Threshold Amplitude: 1 V
Lead Channel Pacing Threshold Pulse Width: 0.5 ms
Lead Channel Pacing Threshold Pulse Width: 0.8 ms
Lead Channel Pacing Threshold Pulse Width: 0.8 ms
Lead Channel Sensing Intrinsic Amplitude: 3.8 mV
Lead Channel Sensing Intrinsic Amplitude: 6.9 mV
Lead Channel Setting Pacing Amplitude: 2 V
Lead Channel Setting Pacing Amplitude: 2.5 V
Lead Channel Setting Pacing Pulse Width: 0.8 ms
Lead Channel Setting Sensing Sensitivity: 2 mV

## 2013-10-20 NOTE — Patient Instructions (Signed)
Your physician wants you to follow-up in: 12 months with Dr Vallery Ridge will receive a reminder letter in the mail two months in advance. If you don't receive a letter, please call our office to schedule the follow-up appointment.    Remote monitoring is used to monitor your Pacemaker or ICD from home. This monitoring reduces the number of office visits required to check your device to one time per year. It allows [place] to keep an eye on the functioning of your device to ensure it is working properly. You are scheduled for a device check from home on 01/19/14. You may send your transmission at any time that day. If you have a wireless device, the transmission will be sent automatically. After your physician reviews your transmission, you will receive a postcard with your next transmission date.    Your physician has recommended you make the following change in your medication: 1) Stop Pradaxa

## 2013-10-23 NOTE — Progress Notes (Signed)
PCP: Victor Medal, MD Primary Cardiologist: [name] is a 63 y.o. male who presents today for routine electrophysiology followup.  Since last being seen in our clinic, the patient reports doing reasonably well.  He feels that his SOB is improved with treatment of lung disease by Dr [name]. He had a CPX recently which revealed limitations primarily due to deconditioning.  There was some concern of chronotropic incompetence though his histograms on PPM interrogation seem to be good. Today, he denies symptoms of palpitations, chest pain,  lower extremity edema, dizziness, presyncope, or syncope.  The patient is otherwise without complaint today.   Past Medical History  Diagnosis Date  . Hypertension   . Hyperlipidemia   . Fibromyalgia   . Obesity   . Mitral regurgitation     trivial  . Aortic stenosis, mild   . Left atrial enlargement     LA size 81mm by echo 11/21/11  . Pacemaker 03/31/2012  . Heart murmur   . Asthma     "little bit" (03/31/2012)  . Exertional dyspnea   . Obstructive sleep apnea     mild by sleep study 2013  . Hypothyroidism     S/P radiation  . DJD (degenerative joint disease)   . Arthritis of hand     "just a little bit in both hands" (03/31/2012)  . Atrial fibrillation     dx '04; DCCV '04, placed on flecainide, failed DCCV 04/2010, flecainide stopped; s/p successful A.fib ablation 01/31/12  . First degree AV block     post ablation, ~453ms  . Second degree AV block    Past Surgical History  Procedure Laterality Date  . Finger tendon repair  1980's    "right little finger" (03/31/2012)  . US echocardiography  08/07/2009    EF 55-60%  . Doppler echocardiography  03/11/2002    EF 70-75%  . Cardiovascular stress test  03/12/2002    EF 48%, NO EVIDENCE OF ISCHEMIA  . Tee without cardioversion  01/30/2012    Procedure: TRANSESOPHAGEAL ECHOCARDIOGRAM (TEE);  Surgeon: Victor Dresser, MD;  Location: [facility];  Service: Cardiovascular;  Laterality:  N/A;  ablation next day  . Insert / replace / remove pacemaker  03/31/2012    initial placement  . Atrial fibrillation ablation  01/30/2012    PVI by Dr. Rayann [name]  . Lumbar disc surgery  1980's X2;  2000's  . Cardioversion  01/2012; 03/31/2012    Current Outpatient Prescriptions  Medication Sig Dispense Refill  . albuterol (PROAIR HFA) 108 (90 BASE) MCG/ACT inhaler Inhale 2 puffs into the lungs every 6 (six) hours as needed for wheezing or shortness of breath.  1 Inhaler  3  . aliskiren (TEKTURNA) 300 MG tablet Take 300 mg by mouth daily.      [name] amLODipine (NORVASC) 5 MG tablet Take 10 mg by mouth daily.       . Azilsartan Medoxomil (EDARBI) 80 MG TABS Take 80 mg by mouth daily.      . budesonide-formoterol (SYMBICORT) 160-4.5 MCG/ACT inhaler Inhale 2 puffs into the lungs 2 (two) times daily.  1 Inhaler  3  . carvedilol (COREG) 6.25 MG tablet Take 1 tablet (6.25 mg total) by mouth 2 (two) times daily.  180 tablet  3  . Cholecalciferol (VITAMIN D) 2000 UNITS CAPS Take 2,000 Units by mouth daily.      . cyclobenzaprine (FLEXERIL) 10 MG tablet Take 10 mg by mouth daily.       . DULoxetine (CYMBALTA)  60 MG capsule Take 60 mg by mouth daily.       [name] HYDROcodone-acetaminophen (NORCO/VICODIN) 5-325 MG per tablet Take 1 tablet by mouth every 8 (eight) hours as needed.       [name] levothyroxine (SYNTHROID, LEVOTHROID) 150 MCG tablet Take 150 mcg by mouth daily before breakfast.      . Melatonin-Pyridoxine (MELATONEX) 3-10 MG TBCR Take 1 tablet by mouth at bedtime.      . pantoprazole (PROTONIX) 40 MG tablet Take 40 mg by mouth daily.      [name] rOPINIRole (REQUIP) 1 MG tablet Take 1 mg by mouth daily.      . rosuvastatin (CRESTOR) 10 MG tablet Take 10 mg by mouth daily.      . traMADol (ULTRAM) 50 MG tablet Take 1 tablet by mouth daily.       . valACYclovir (VALTREX) 1000 MG tablet Take 1 tablet by mouth every 12 (twelve) hours as needed.      . zolpidem (AMBIEN) 10 MG tablet Take 10 mg by mouth at bedtime.  For sleep       No current facility-administered medications for this visit.   ROS- all systems are reviewed and negative except as per HPI above  Physical Exam: Filed Vitals:   10/20/13 1528  BP: 144/76  Pulse: 64  Height: 5\' 11"  (1.803 m)  Weight: 228 lb 3.2 oz (103.511 kg)    GEN- The patient is well appearing, alert and oriented x 3 today.   Head- normocephalic, atraumatic Eyes-  Sclera clear, conjunctiva pink Ears- hearing intact Oropharynx- clear Lungs- Clear to ausculation bilaterally, normal work of breathing Chest- pacemaker pocket is well healed Heart- Regular rate and rhythm, no murmurs, rubs or gallops, PMI not laterally displaced GI- soft, NT, ND, + BS Extremities- no clubbing, cyanosis, or edema  Pacemaker interrogation- reviewed in detail today,  See PACEART report  Assessment and Plan:  1. Second degree AV block Normal pacemaker function See Pace Art report Rate response is turned on today (see below)  2. SOB Improved with mangement of lung disease by Dr [name]. Cardiac CT is reviewed and did not reveal any pulmonary vein stenosis. I have also reviewed his CPX which revealed primarily deconditioning and obesity.  There was some concern for chronotropic incompetence though pacemaker interrogation today reveals preserved histogram.  I will go ahead and turn on rate response at this time to hopefully improve heart rates at higher exercise levels.  3. afib Maintaining sinus rhythm Continue current regimen Chads2vasc score is 1. Stop pradaxa today.  4. htn Stable No change required today Weight loss and sodium restriction is advised  5. Obesity Weight loss is advised  Return in 1 year for follow-up Follow-up with Dr Einar [name] as scheduled

## 2013-11-25 ENCOUNTER — Encounter: Payer: Self-pay | Admitting: *Deleted

## 2013-11-25 ENCOUNTER — Encounter: Payer: Self-pay | Admitting: Emergency Medicine

## 2013-12-02 ENCOUNTER — Encounter: Payer: Self-pay | Admitting: Internal Medicine

## 2013-12-02 ENCOUNTER — Ambulatory Visit (INDEPENDENT_AMBULATORY_CARE_PROVIDER_SITE_OTHER): Payer: BC Managed Care – PPO | Admitting: Internal Medicine

## 2013-12-02 VITALS — BP 163/90 | HR 70 | Ht 71.0 in | Wt 232.8 lb

## 2013-12-02 DIAGNOSIS — I10 Essential (primary) hypertension: Secondary | ICD-10-CM

## 2013-12-02 DIAGNOSIS — R001 Bradycardia, unspecified: Secondary | ICD-10-CM

## 2013-12-02 DIAGNOSIS — I4891 Unspecified atrial fibrillation: Secondary | ICD-10-CM

## 2013-12-02 DIAGNOSIS — I498 Other specified cardiac arrhythmias: Secondary | ICD-10-CM

## 2013-12-02 DIAGNOSIS — I480 Paroxysmal atrial fibrillation: Secondary | ICD-10-CM

## 2013-12-02 DIAGNOSIS — I441 Atrioventricular block, second degree: Secondary | ICD-10-CM

## 2013-12-02 LAB — MDC_IDC_ENUM_SESS_TYPE_INCLINIC
Battery Remaining Longevity: 80.4 mo
Battery Voltage: 2.93 V
Brady Statistic RA Percent Paced: 22 %
Brady Statistic RV Percent Paced: 98 %
Date Time Interrogation Session: 20150806134814
Implantable Pulse Generator Model: 2210
Implantable Pulse Generator Serial Number: 7408461
Lead Channel Impedance Value: 400 Ohm
Lead Channel Impedance Value: 650 Ohm
Lead Channel Pacing Threshold Amplitude: 1 V
Lead Channel Pacing Threshold Amplitude: 1 V
Lead Channel Pacing Threshold Amplitude: 1 V
Lead Channel Pacing Threshold Pulse Width: 0.5 ms
Lead Channel Pacing Threshold Pulse Width: 0.8 ms
Lead Channel Pacing Threshold Pulse Width: 0.8 ms
Lead Channel Sensing Intrinsic Amplitude: 1.3 mV
Lead Channel Sensing Intrinsic Amplitude: 7.5 mV
Lead Channel Setting Pacing Amplitude: 2 V
Lead Channel Setting Pacing Amplitude: 2.5 V
Lead Channel Setting Pacing Pulse Width: 0.8 ms
Lead Channel Setting Sensing Sensitivity: 2 mV

## 2013-12-02 MED ORDER — CARVEDILOL 12.5 MG PO TABS: 12.5000 mg | ORAL_TABLET | Freq: Two times a day (BID) | ORAL | Status: DC

## 2013-12-02 NOTE — Patient Instructions (Addendum)
Your physician recommends that you schedule a follow-up appointment in: 4 weeks with Dr Rayann Heman   Your physician has recommended you make the following change in your medication:  1) Increase Carvedilol to 12.5mg  twice daily

## 2013-12-02 NOTE — Progress Notes (Signed)
PCP: Victor Medal, MD Primary Cardiologist: Neiko Trivedi is a 63 y.o. male who presents today for follow up after remote device evaluation revealed afib. Pt was last seen in June without evidence of afib and Noac was stopped due to chads score of 1. Interrogation today reveals afib since 7/27.Patient is unaware of afib and is asymptomatic. Option given to restart NOAC, although guidelines do not require for chadsvasc of 1, pt defers at this time. He is v paced and has a regular ventricular rhythm. BP not optimally controlled and maybe driving afib as well and obesity.  Today, he denies symptoms of palpitations, chest pain,  lower extremity edema, dizziness, presyncope, or syncope.  The patient is otherwise without complaint today.   Past Medical History  Diagnosis Date  . Hypertension   . Hyperlipidemia   . Fibromyalgia   . Obesity   . Mitral regurgitation     trivial  . Aortic stenosis, mild   . Left atrial enlargement     LA size 21mm by echo 11/21/11  . Pacemaker 03/31/2012  . Heart murmur   . Asthma     "little bit" (03/31/2012)  . Exertional dyspnea   . Obstructive sleep apnea     mild by sleep study 2013  . Hypothyroidism     S/P radiation  . DJD (degenerative joint disease)   . Arthritis of hand     "just a little bit in both hands" (03/31/2012)  . Atrial fibrillation     dx '04; DCCV '04, placed on flecainide, failed DCCV 04/2010, flecainide stopped; s/p successful A.fib ablation 01/31/12  . 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 Second degree AV block    Past Surgical History  Procedure Laterality Date  . Finger tendon repair  1980's    "right little finger" (03/31/2012)  . US echocardiography  08/07/2009    EF 55-60%  . Doppler echocardiography  03/11/2002    EF 70-75%  . Cardiovascular stress test  03/12/2002    EF 48%, NO EVIDENCE OF ISCHEMIA  . Tee without cardioversion  01/30/2012    Procedure: TRANSESOPHAGEAL ECHOCARDIOGRAM (TEE);  Surgeon:  Larey Dresser, MD;  Location: [place];  Service: Cardiovascular;  Laterality: N/A;  ablation next day  . Pacemaker insertion  03/31/2012    STJ Accent DR pacemaker implanted by Dr Rayann Heman  . Atrial fibrillation ablation  01/30/2012    PVI by Dr. Rayann Heman  . Lumbar disc surgery  1980's X2;  2000's  . Cardioversion  01/2012; 03/31/2012    Current Outpatient Prescriptions  Medication Sig Dispense Refill  . albuterol (PROAIR HFA) 108 (90 BASE) MCG/ACT inhaler Inhale 2 puffs into the lungs every 6 (six) hours as needed for wheezing or shortness of breath.  1 Inhaler  3  . aliskiren (TEKTURNA) 300 MG tablet Take 300 mg by mouth daily.      [name] amLODipine (NORVASC) 5 MG tablet Take 10 mg by mouth daily.       . Azilsartan Medoxomil (EDARBI) 80 MG TABS Take 80 mg by mouth daily.      . budesonide-formoterol (SYMBICORT) 160-4.5 MCG/ACT inhaler Inhale 2 puffs into the lungs 2 (two) times daily.  1 Inhaler  3  . carvedilol (COREG) 12.5 MG tablet Take 1 tablet (12.5 mg total) by mouth 2 (two) times daily.  180 tablet  3  . Cholecalciferol (VITAMIN D) 2000 UNITS CAPS Take 2,000 Units by mouth daily.      [name]  cyclobenzaprine (FLEXERIL) 10 MG tablet Take 10 mg by mouth daily.       . DULoxetine (CYMBALTA) 60 MG capsule Take 60 mg by mouth daily.       [name] HYDROcodone-acetaminophen (NORCO/VICODIN) 5-325 MG per tablet Take 1 tablet by mouth every 8 (eight) hours as needed.       [name] levothyroxine (SYNTHROID, LEVOTHROID) 150 MCG tablet Take 150 mcg by mouth daily before breakfast.      . Melatonin-Pyridoxine (MELATONEX) 3-10 MG TBCR Take 1 tablet by mouth at bedtime.      . pantoprazole (PROTONIX) 40 MG tablet Take 40 mg by mouth daily.      [name] rOPINIRole (REQUIP) 1 MG tablet Take 1 mg by mouth daily.      . rosuvastatin (CRESTOR) 10 MG tablet Take 10 mg by mouth daily.      . traMADol (ULTRAM) 50 MG tablet Take 1 tablet by mouth daily.       . valACYclovir (VALTREX) 1000 MG tablet Take 1 tablet by mouth every 12  (twelve) hours as needed.      . zolpidem (AMBIEN) 10 MG tablet Take 10 mg by mouth at bedtime. For sleep       No current facility-administered medications for this visit.   ROS- all systems are reviewed and negative except as per HPI above  Physical Exam: Filed Vitals:   12/02/13 1043  BP: 163/90  Pulse: 70  Height: 5\' 11"  (1.803 m)  Weight: 105.597 kg (232 lb 12.8 oz)    GEN- The patient is well appearing, alert and oriented x 3 today.   Head- normocephalic, atraumatic Eyes-  Sclera clear, conjunctiva pink Ears- hearing intact Oropharynx- clear Lungs- Clear to ausculation bilaterally, normal work of breathing Chest- pacemaker pocket is well healed Heart- Regular rate and rhythm, no murmurs, rubs or gallops, PMI not laterally displaced GI- soft, NT, ND, + BS Extremities- no clubbing, cyanosis, or edema  Pacemaker interrogation- reviewed in detail today,  See PACEART report  Assessment and Plan:  1. Second degree AV block Normal pacemaker function See Pace Art report Rate response is turned on, adjusted last visit to improve heart rates at higher exercise levels, to help improve chronic SOB.  2. Persistent Afib AFib  by interrogation since 7/27. Pt asymptomatic and does not want to change course of treatment. He chooses to continue off anticoagulation at this point for chadsvasc of 1.  4. HTN Poorly controlled maybe contributing to Afib. Increase coreg to 12.5 mg bid  Weight loss and sodium restriction is advised  5. Obesity Weight loss is advised  Return in one month. Follow-up with Dr Einar Gip as scheduled

## 2013-12-13 ENCOUNTER — Encounter: Payer: Self-pay | Admitting: Internal Medicine

## 2013-12-22 ENCOUNTER — Encounter: Payer: Self-pay | Admitting: Internal Medicine

## 2013-12-23 ENCOUNTER — Telehealth: Payer: Self-pay | Admitting: Emergency Medicine

## 2013-12-23 NOTE — Telephone Encounter (Signed)
Called and spoke with pts wife and she stated that the pt has the requip 0.5 mg and he is about to run out of these and would like the 1 mg tablets called into his mail order pharmacy.  Number is listed above in the message. RB please advise if ok to call this in to his pharmacy.  Thanks  Allergies  Allergen Reactions  . Meloxicam Rash    Current Outpatient Prescriptions on File Prior to Visit  Medication Sig Dispense Refill  . albuterol (PROAIR HFA) 108 (90 BASE) MCG/ACT inhaler Inhale 2 puffs into the lungs every 6 (six) hours as needed for wheezing or shortness of breath.  1 Inhaler  3  . aliskiren (TEKTURNA) 300 MG tablet Take 300 mg by mouth daily.      [name] amLODipine (NORVASC) 5 MG tablet Take 10 mg by mouth daily.       . Azilsartan Medoxomil (EDARBI) 80 MG TABS Take 80 mg by mouth daily.      . budesonide-formoterol (SYMBICORT) 160-4.5 MCG/ACT inhaler Inhale 2 puffs into the lungs 2 (two) times daily.  1 Inhaler  3  . carvedilol (COREG) 12.5 MG tablet Take 1 tablet (12.5 mg total) by mouth 2 (two) times daily.  180 tablet  3  . Cholecalciferol (VITAMIN D) 2000 UNITS CAPS Take 2,000 Units by mouth daily.      . cyclobenzaprine (FLEXERIL) 10 MG tablet Take 10 mg by mouth daily.       . DULoxetine (CYMBALTA) 60 MG capsule Take 60 mg by mouth daily.       [name] HYDROcodone-acetaminophen (NORCO/VICODIN) 5-325 MG per tablet Take 1 tablet by mouth every 8 (eight) hours as needed.       [name] levothyroxine (SYNTHROID, LEVOTHROID) 150 MCG tablet Take 150 mcg by mouth daily before breakfast.      . Melatonin-Pyridoxine (MELATONEX) 3-10 MG TBCR Take 1 tablet by mouth at bedtime.      . pantoprazole (PROTONIX) 40 MG tablet Take 40 mg by mouth daily.      [name] rOPINIRole (REQUIP) 1 MG tablet Take 1 mg by mouth daily.      . rosuvastatin (CRESTOR) 10 MG tablet Take 10 mg by mouth daily.      . traMADol (ULTRAM) 50 MG tablet Take 1 tablet by mouth daily.       . valACYclovir (VALTREX) 1000 MG tablet Take 1  tablet by mouth every 12 (twelve) hours as needed.      . zolpidem (AMBIEN) 10 MG tablet Take 10 mg by mouth at bedtime. For sleep       No current facility-administered medications on file prior to visit.

## 2013-12-23 NOTE — Telephone Encounter (Signed)
Yes this is ok 

## 2013-12-24 MED ORDER — ROPINIROLE HCL 1 MG PO TABS: 1.0000 mg | ORAL_TABLET | Freq: Every day | ORAL | Status: DC

## 2013-12-24 NOTE — Telephone Encounter (Signed)
Called and spoke to pt's wife. Informed pt of the refill. Pt's wife requested the refill be sent [facility] Phone [phone].   Requip called in. Nothing further needed.

## 2013-12-30 ENCOUNTER — Ambulatory Visit (INDEPENDENT_AMBULATORY_CARE_PROVIDER_SITE_OTHER): Payer: BC Managed Care – PPO | Admitting: Internal Medicine

## 2013-12-30 ENCOUNTER — Encounter: Payer: Self-pay | Admitting: Internal Medicine

## 2013-12-30 VITALS — BP 142/86 | HR 92 | Ht 71.0 in | Wt 230.0 lb

## 2013-12-30 DIAGNOSIS — Z950 Presence of cardiac pacemaker: Secondary | ICD-10-CM

## 2013-12-30 DIAGNOSIS — I4891 Unspecified atrial fibrillation: Secondary | ICD-10-CM

## 2013-12-30 DIAGNOSIS — I10 Essential (primary) hypertension: Secondary | ICD-10-CM

## 2013-12-30 DIAGNOSIS — I4819 Other persistent atrial fibrillation: Secondary | ICD-10-CM

## 2013-12-30 LAB — MDC_IDC_ENUM_SESS_TYPE_INCLINIC
Battery Remaining Longevity: 85.2 mo
Battery Voltage: 2.93 V
Brady Statistic RA Percent Paced: 5.7 %
Brady Statistic RV Percent Paced: 98 %
Date Time Interrogation Session: 20150903162623
Implantable Pulse Generator Model: 2210
Implantable Pulse Generator Serial Number: 7408461
Lead Channel Impedance Value: 412.5 Ohm
Lead Channel Impedance Value: 662.5 Ohm
Lead Channel Pacing Threshold Amplitude: 0.625 V
Lead Channel Pacing Threshold Amplitude: 1 V
Lead Channel Pacing Threshold Pulse Width: 0.5 ms
Lead Channel Pacing Threshold Pulse Width: 0.5 ms
Lead Channel Sensing Intrinsic Amplitude: 4.2 mV
Lead Channel Sensing Intrinsic Amplitude: 6.8 mV
Lead Channel Setting Pacing Amplitude: 2 V
Lead Channel Setting Pacing Amplitude: 2.5 V
Lead Channel Setting Pacing Pulse Width: 0.8 ms
Lead Channel Setting Sensing Sensitivity: 2 mV

## 2013-12-30 MED ORDER — DABIGATRAN ETEXILATE MESYLATE 150 MG PO CAPS: 150.0000 mg | ORAL_CAPSULE | Freq: Two times a day (BID) | ORAL | Status: DC

## 2013-12-30 MED ORDER — CARVEDILOL 12.5 MG PO TABS: 12.5000 mg | ORAL_TABLET | Freq: Two times a day (BID) | ORAL | Status: DC

## 2013-12-30 NOTE — Assessment & Plan Note (Signed)
His [employer] pacemaker is working normally. Interrogation today demonstrates that he is 100% and atrial fibrillation.

## 2013-12-30 NOTE — Patient Instructions (Signed)
Your physician has recommended you make the following change in your medication:  1.) start PRADAXA 150 mg by mouth twice daily  Your physician recommends that you schedule a follow-up appointment in: December or January  with Dr. Rayann Heman.

## 2013-12-30 NOTE — Progress Notes (Signed)
HPI [name] returns today for ongoing followup of atrial fibrillation. He is a very pleasant 63 year old man who follows mostly with Dr. Rayann Heman. He has a history of atrial fibrillation, and underwent catheter ablation, and has maintained normal sinus rhythm until approximately 6 weeks ago. Since then, the patient has had persistent atrial fibrillation. He has not been on antiarrhythmic drug therapy. He is almost asymptomatic. The patient is not on systemic anticoagulation. He has not had syncope. He cannot tell if he is in atrial fibrillation. Allergies  Allergen Reactions  . Meloxicam Rash     Current Outpatient Prescriptions  Medication Sig Dispense Refill  . albuterol (PROAIR HFA) 108 (90 BASE) MCG/ACT inhaler Inhale 2 puffs into the lungs every 6 (six) hours as needed for wheezing or shortness of breath.  1 Inhaler  3  . aliskiren (TEKTURNA) 300 MG tablet Take 300 mg by mouth daily.      [name] amLODipine (NORVASC) 5 MG tablet Take 10 mg by mouth daily.       . Azilsartan Medoxomil (EDARBI) 80 MG TABS Take 80 mg by mouth daily.      . budesonide-formoterol (SYMBICORT) 160-4.5 MCG/ACT inhaler Inhale 2 puffs into the lungs 2 (two) times daily.  1 Inhaler  3  . carvedilol (COREG) 12.5 MG tablet Take 1 tablet (12.5 mg total) by mouth 2 (two) times daily.  180 tablet  3  . Cholecalciferol (VITAMIN D) 2000 UNITS CAPS Take 2,000 Units by mouth daily.      . cyclobenzaprine (FLEXERIL) 10 MG tablet Take 10 mg by mouth daily.       . DULoxetine (CYMBALTA) 60 MG capsule Take 60 mg by mouth daily.       [name] HYDROcodone-acetaminophen (NORCO/VICODIN) 5-325 MG per tablet Take 1 tablet by mouth every 8 (eight) hours as needed.       [name] levothyroxine (SYNTHROID, LEVOTHROID) 150 MCG tablet Take 150 mcg by mouth daily before breakfast.      . Melatonin-Pyridoxine (MELATONEX) 3-10 MG TBCR Take 1 tablet by mouth at bedtime.      . pantoprazole (PROTONIX) 40 MG tablet Take 40 mg by mouth daily.      [name]  rOPINIRole (REQUIP) 1 MG tablet Take 1 tablet (1 mg total) by mouth daily.  90 tablet  2  . rosuvastatin (CRESTOR) 10 MG tablet Take 10 mg by mouth daily.      . traMADol (ULTRAM) 50 MG tablet Take 1 tablet by mouth daily.       . valACYclovir (VALTREX) 1000 MG tablet Take 1 tablet by mouth every 12 (twelve) hours as needed.      . zolpidem (AMBIEN) 10 MG tablet Take 10 mg by mouth at bedtime. For sleep      . dabigatran (PRADAXA) 150 MG CAPS capsule Take 1 capsule (150 mg total) by mouth 2 (two) times daily with a meal.  180 capsule  3   No current facility-administered medications for this visit.     Past Medical History  Diagnosis Date  . Hypertension   . Hyperlipidemia   . Fibromyalgia   . Obesity   . Mitral regurgitation     trivial  . Aortic stenosis, mild   . Left atrial enlargement     LA size 8mm by echo 11/21/11  . Pacemaker 03/31/2012  . Heart murmur   . Asthma     "little bit" (03/31/2012)  . Exertional dyspnea   . Obstructive sleep apnea  mild by sleep study 2013  . Hypothyroidism     S/P radiation  . DJD (degenerative joint disease)   . Arthritis of hand     "just a little bit in both hands" (03/31/2012)  . Atrial fibrillation     dx '04; DCCV '04, placed on flecainide, failed DCCV 04/2010, flecainide stopped; s/p successful A.fib ablation 01/31/12  . 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 Second degree AV block     ROS:   All systems reviewed and negative except as noted in the HPI.   Past Surgical History  Procedure Laterality Date  . Finger tendon repair  1980's    "right little finger" (03/31/2012)  . US echocardiography  08/07/2009    EF 55-60%  . Doppler echocardiography  03/11/2002    EF 70-75%  . Cardiovascular stress test  03/12/2002    EF 48%, NO EVIDENCE OF ISCHEMIA  . Tee without cardioversion  01/30/2012    Procedure: TRANSESOPHAGEAL ECHOCARDIOGRAM (TEE);  Surgeon: Larey Dresser, MD;  Location: [place];  Service:  Cardiovascular;  Laterality: N/A;  ablation next day  . Pacemaker insertion  03/31/2012    STJ Accent DR pacemaker implanted by Dr Rayann Heman  . Atrial fibrillation ablation  01/30/2012    PVI by Dr. Rayann Heman  . Lumbar disc surgery  1980's X2;  2000's  . Cardioversion  01/2012; 03/31/2012     Family History  Problem Relation Age of Onset  . Heart failure Mother   . Stroke Father      History   Social History  . Marital Status: Married    Spouse Name: N/A    Number of Children: N/A  . Years of Education: N/A   Occupational History  . Not on file.   Social History Main Topics  . Smoking status: Former Smoker -- 0.50 packs/day for 15 years    Types: Cigarettes    Quit date: 07/18/1991  . Smokeless tobacco: Never Used  . Alcohol Use: Yes     Comment: 1-2 times a year  . Drug Use: No  . Sexual Activity: Not Currently   Other Topics Concern  . Not on file   Social History Narrative   Lives in [place] with spouse.  Works as a Air traffic controller.     BP 142/86  Pulse 92  Ht 5\' 11"  (1.803 m)  Wt 230 lb (104.327 kg)  BMI 32.09 kg/m2  Physical Exam:  Well appearing 63 year old man, NAD HEENT: Unremarkable Neck:  No JVD, no thyromegally Back:  No CVA tenderness Lungs:  Clear with no wheezes, rales, or rhonchi. HEART:  Regular rate rhythm, no murmurs, no rubs, no clicks Abd:  soft, positive bowel sounds, no organomegally, no rebound, no guarding Ext:  2 plus pulses, no edema, no cyanosis, no clubbing Skin:  No rashes no nodules Neuro:  CN II through XII intact, motor grossly intact  EKG - atrial fibrillation with ventricular pacing  DEVICE  Normal device function.  See PaceArt for details.   Assess/Plan:

## 2013-12-30 NOTE — Assessment & Plan Note (Signed)
We discussed systemic anticoagulation with the patient. I discussed the issue of anticoagulation with Dr. Rayann Heman as well. We will restart Pradaxa. He will followup with Dr. Rayann Heman. A strategy of rhythm control has been considered, but he appears to be asymptomatic with regard to his atrial fibrillation.

## 2014-01-05 ENCOUNTER — Encounter: Payer: Self-pay | Admitting: Internal Medicine

## 2014-01-11 ENCOUNTER — Other Ambulatory Visit: Payer: Self-pay | Admitting: *Deleted

## 2014-01-11 DIAGNOSIS — I10 Essential (primary) hypertension: Secondary | ICD-10-CM

## 2014-01-11 DIAGNOSIS — I4819 Other persistent atrial fibrillation: Secondary | ICD-10-CM

## 2014-01-11 MED ORDER — CARVEDILOL 12.5 MG PO TABS: 12.5000 mg | ORAL_TABLET | Freq: Two times a day (BID) | ORAL | Status: DC

## 2014-01-11 MED ORDER — DABIGATRAN ETEXILATE MESYLATE 150 MG PO CAPS: 150.0000 mg | ORAL_CAPSULE | Freq: Two times a day (BID) | ORAL | Status: DC

## 2014-03-01 ENCOUNTER — Encounter: Payer: Self-pay | Admitting: Emergency Medicine

## 2014-03-01 ENCOUNTER — Ambulatory Visit (INDEPENDENT_AMBULATORY_CARE_PROVIDER_SITE_OTHER): Payer: BC Managed Care – PPO | Admitting: Emergency Medicine

## 2014-03-01 VITALS — BP 150/92 | HR 71 | Temp 98.8°F | Ht 71.0 in | Wt 228.4 lb

## 2014-03-01 DIAGNOSIS — J45909 Unspecified asthma, uncomplicated: Secondary | ICD-10-CM

## 2014-03-01 MED ORDER — ALBUTEROL SULFATE HFA 108 (90 BASE) MCG/ACT IN AERS: 2.0000 | INHALATION_SPRAY | Freq: Four times a day (QID) | RESPIRATORY_TRACT | Status: DC | PRN

## 2014-03-01 MED ORDER — FEXOFENADINE HCL 180 MG PO TABS: 180.0000 mg | ORAL_TABLET | Freq: Every day | ORAL | Status: DC

## 2014-03-01 MED ORDER — FLUTICASONE PROPIONATE 50 MCG/ACT NA SUSP: 2.0000 | Freq: Two times a day (BID) | NASAL | Status: DC

## 2014-03-01 MED ORDER — PREDNISONE 10 MG PO TABS: ORAL_TABLET | ORAL | Status: DC

## 2014-03-01 NOTE — Progress Notes (Signed)
Subjective:    Patient ID: [name], male    DOB: 06-15-1950, 63 y.o.   MRN: [id number]  Shortness of Breath Pertinent negatives include no ear pain, fever, headaches, leg swelling, rash, rhinorrhea, sore throat, vomiting or wheezing.   63 yo man, former smoker (7-8pk-yrs), hx obesity, HTN, mild AS, A Fib s/p ablation and rate control, mild OSA (by PSG '13), asthma dx several yrs ago by Dr Minna Antis. Has been experiencing exertional dyspnea for 2-3 yrs. He notices dyspnea with climbing a hill. He snores, moves his arms and legs continuously through the night. He has lost 20 lbs over last few months which has helped some.   Eval has included PFT that show mixed disease with a definite obstructive component based on a response to bronchodilator 08/05/13. Diffusion corrected for alveolar volume.  CPST and L/R caths as below.   ROV 10/13/13 -- follows up for SOB on exertion. He has asthma, secondary PAH.  He underwent low prob V/q scan, ONO, walking oximetry.  He has lost some wt. He has noticed that if he exerts himself he still has SOB, then the next day he feels bad, nauseated. He remains on symbicort, feels that it helps him. Rarely uses albuterol.   ROV 03/01/14 -- follows up for SOB on exertion. He has asthma, secondary PAH.  2nd degree AV block. Since last time his pacer rate was increased > seemed to help him a lot. He complains today of increased cough and phlegm. Cough productive of light brown. He has seen Dr Minna Antis x 2, treated with steroids and doxycycline. Denies any GERD sx.     CPST 07/14/13 >>  Conclusion: Exercise testing with gas exchange demonstrates a moderate functional limitation when compared to matched sedentary norms. The limitation to exercise appears multifactorial in nature due to his obesity and related restrictive lung physiology. There is also evidence of chronotropic incompetence and a mild circulatory limitation which may be related to diastolic dysfunction and  pulmonary HTN. The limitation appears truly multifactorial and there does not appear to be one dominant factor at this point. Suspect his functional capacity may improve with weight loss and possible reduction in his AVN blockers.  Heart cath Dr Nadyne Coombes 7/14 >  Procedural data:  RA pressure 14/11 Mean 10 mm mercury. RA saturation NA.  RV pressure 48/0 and Right ventricular EDP 13 mm Hg.  PA pressure 51/14 with a mean of 40 mm mercury. PA saturation 72%.  Pulmonary capillary wedge 23/32 with a mean of 23 mm Hg. Aortic saturation 97%.  Cardiac output was 6.84 with cardiac index of 3.06 by Fick. Normal    Review of Systems  Constitutional: Negative for fever and unexpected weight change.  HENT: Positive for congestion and sinus pressure. Negative for dental problem, ear pain, nosebleeds, postnasal drip, rhinorrhea, sneezing, sore throat and trouble swallowing.   Eyes: Negative for redness and itching.  Respiratory: Positive for cough and shortness of breath. Negative for chest tightness and wheezing.   Cardiovascular: Positive for palpitations. Negative for leg swelling.  Gastrointestinal: Negative for nausea and vomiting.  Genitourinary: Negative for dysuria.  Musculoskeletal: Positive for joint swelling.       Stiffness and pain Knee and hands  Skin: Negative for rash.  Neurological: Negative for headaches.  Hematological: Does not bruise/bleed easily.  Psychiatric/Behavioral: Negative for dysphoric mood. The patient is not nervous/anxious.       Objective:   Physical Exam Filed Vitals:   03/01/14 1113  BP: 150/92  Pulse: 71  Temp: 98.8 F (37.1 C)  TempSrc: Oral  Height: 5\' 11"  (1.803 m)  Weight: 228 lb 6.4 oz (103.602 kg)  SpO2: 95%   Gen: Pleasant, well-nourished, in no distress,  normal affect  ENT: No lesions,  mouth clear,  oropharynx clear, no postnasal drip  Neck: No JVD, no TMG, no carotid bruits  Lungs: No use of accessory muscles, clear without rales or  rhonchi  Cardiovascular: RRR, heart sounds normal, no murmur or gallops, no peripheral edema  Abdomen: soft and NT, obese, umbilical hernia  Musculoskeletal: No deformities, no cyanosis or clubbing  Neuro: alert, non focal  Skin: Warm, no lesions or rashes      Assessment & Plan:  Intrinsic asthma He does have some wheezing today. I suspect that his symptoms over the last 2 weeks relate to chronic upper airway irritation and flare of his asthma. He has been treated with antibiotics and presumably Depo-Medrol based on his description. I would like to treat him with prednisone taper and treat his allergic rhinitis aggressively to hopefully quiet his upper airway contribution.  - start allergy regimen - pred taper - he has already been treated with abx by Dr Minna Antis x 2 - continue pantoprazole and symbicort - rov 1

## 2014-03-01 NOTE — Assessment & Plan Note (Signed)
He does have some wheezing today. I suspect that his symptoms over the last 2 weeks relate to chronic upper airway irritation and flare of his asthma. He has been treated with antibiotics and presumably Depo-Medrol based on his description. I would like to treat him with prednisone taper and treat his allergic rhinitis aggressively to hopefully quiet his upper airway contribution.  - start allergy regimen - pred taper - he has already been treated with abx by Dr Minna Antis x 2 - continue pantoprazole and symbicort - rov 1

## 2014-03-01 NOTE — Patient Instructions (Signed)
Please take prednisone taper as directed.  Start fluticasone nasal spray, 2 sprays Twice a day Start fexofenadine 180 mg once a day Continue your Symbicort 2 puffs twice a day Use albuterol 2 puffs up to every 4 hours as needed for shortness of breath Follow with Dr Lamonte Sakai in 1 month

## 2014-03-31 ENCOUNTER — Ambulatory Visit (INDEPENDENT_AMBULATORY_CARE_PROVIDER_SITE_OTHER): Payer: BC Managed Care – PPO | Admitting: Emergency Medicine

## 2014-03-31 ENCOUNTER — Encounter: Payer: Self-pay | Admitting: Emergency Medicine

## 2014-03-31 VITALS — BP 118/80 | HR 69 | Ht 71.0 in | Wt 235.0 lb

## 2014-03-31 DIAGNOSIS — J449 Chronic obstructive pulmonary disease, unspecified: Secondary | ICD-10-CM

## 2014-03-31 MED ORDER — ALBUTEROL SULFATE HFA 108 (90 BASE) MCG/ACT IN AERS: 2.0000 | INHALATION_SPRAY | Freq: Four times a day (QID) | RESPIRATORY_TRACT | Status: DC | PRN

## 2014-03-31 NOTE — Assessment & Plan Note (Signed)
Acute exacerbation improved. Needs to restart his maintenance therapy for his allergies to keep better overall control.

## 2014-03-31 NOTE — Progress Notes (Signed)
Subjective:    Patient ID: [name], male    DOB: 10/04/1950, 63 y.o.   MRN: [id number]  Shortness of Breath Pertinent negatives include no ear pain, fever, headaches, leg swelling, rash, rhinorrhea, sore throat, vomiting or wheezing.   63 yo man, former smoker (7-8pk-yrs), hx obesity, HTN, mild AS, A Fib s/p ablation and rate control, mild OSA (by PSG '13), asthma dx several yrs ago by Dr Minna Antis. Has been experiencing exertional dyspnea for 2-3 yrs. He notices dyspnea with climbing a hill. He snores, moves his arms and legs continuously through the night. He has lost 20 lbs over last few months which has helped some.   Eval has included PFT that show mixed disease with a definite obstructive component based on a response to bronchodilator 08/05/13. Diffusion corrected for alveolar volume.  CPST and L/R caths as below.   ROV 10/13/13 -- follows up for SOB on exertion. He has asthma, secondary PAH.  He underwent low prob V/q scan, ONO, walking oximetry.  He has lost some wt. He has noticed that if he exerts himself he still has SOB, then the next day he feels bad, nauseated. He remains on symbicort, feels that it helps him. Rarely uses albuterol.   ROV 03/01/14 -- follows up for SOB on exertion. He has asthma, secondary PAH.  2nd degree AV block. Since last time his pacer rate was increased > seemed to help him a lot. He complains today of increased cough and phlegm. Cough productive of light brown. He has seen Dr Minna Antis x 2, treated with steroids and doxycycline. Denies any GERD sx.   ROV 03/31/14 -- follow up visit for asthma, secondary PAH. We treated him last time for an AE. He is better but still having cough. He is out of allegra, doesn't use nasal steroid daily.  Phlegm is usually clear.     CPST 07/14/13 >>  Conclusion: Exercise testing with gas exchange demonstrates a moderate functional limitation when compared to matched sedentary norms. The limitation to exercise appears  multifactorial in nature due to his obesity and related restrictive lung physiology. There is also evidence of chronotropic incompetence and a mild circulatory limitation which may be related to diastolic dysfunction and pulmonary HTN. The limitation appears truly multifactorial and there does not appear to be one dominant factor at this point. Suspect his functional capacity may improve with weight loss and possible reduction in his AVN blockers.  Heart cath Dr Nadyne Coombes 7/14 >  Procedural data:  RA pressure 14/11 Mean 10 mm mercury. RA saturation NA.  RV pressure 48/0 and Right ventricular EDP 13 mm Hg.  PA pressure 51/14 with a mean of 40 mm mercury. PA saturation 72%.  Pulmonary capillary wedge 23/32 with a mean of 23 mm Hg. Aortic saturation 97%.  Cardiac output was 6.84 with cardiac index of 3.06 by Fick. Normal    Review of Systems  Constitutional: Negative for fever and unexpected weight change.  HENT: Positive for congestion and sinus pressure. Negative for dental problem, ear pain, nosebleeds, postnasal drip, rhinorrhea, sneezing, sore throat and trouble swallowing.   Eyes: Negative for redness and itching.  Respiratory: Positive for cough and shortness of breath. Negative for chest tightness and wheezing.   Cardiovascular: Positive for palpitations. Negative for leg swelling.  Gastrointestinal: Negative for nausea and vomiting.  Genitourinary: Negative for dysuria.  Musculoskeletal: Positive for joint swelling.       Stiffness and pain Knee and hands  Skin: Negative for rash.  Neurological: Negative for headaches.  Hematological: Does not bruise/bleed easily.  Psychiatric/Behavioral: Negative for dysphoric mood. The patient is not nervous/anxious.       Objective:   Physical Exam Filed Vitals:   03/31/14 1120  BP: 118/80  Pulse: 69  Height: 5\' 11"  (1.803 m)  Weight: 235 lb (106.595 kg)  SpO2: 98%   Gen: Pleasant, well-nourished, in no distress,  normal  affect  ENT: No lesions,  mouth clear,  oropharynx clear, no postnasal drip  Neck: No JVD, no TMG, no carotid bruits  Lungs: No use of accessory muscles, clear without rales or rhonchi  Cardiovascular: RRR, heart sounds normal, no murmur or gallops, no peripheral edema  Musculoskeletal: No deformities, no cyanosis or clubbing  Neuro: alert, non focal  Skin: Warm, no lesions or rashes      Assessment & Plan:  Intrinsic asthma Acute exacerbation improved. Needs to restart his maintenance therapy for his allergies to keep better overall control.

## 2014-03-31 NOTE — Patient Instructions (Addendum)
Please continue your inhaled medications as you have been taking them  Restart your allegra daily and your nasal steroid spray, 2 sprays each nostril daily Follow with Dr Lamonte Sakai in 4 months or sooner if you have any problems.

## 2014-04-07 ENCOUNTER — Encounter (HOSPITAL_COMMUNITY): Payer: Self-pay | Admitting: Internal Medicine

## 2014-04-19 ENCOUNTER — Telehealth: Payer: Self-pay | Admitting: Emergency Medicine

## 2014-04-19 MED ORDER — BUDESONIDE-FORMOTEROL FUMARATE 160-4.5 MCG/ACT IN AERO: 2.0000 | INHALATION_SPRAY | Freq: Two times a day (BID) | RESPIRATORY_TRACT | Status: DC

## 2014-04-19 NOTE — Telephone Encounter (Signed)
Spoke with [employer]. She reports we sent proair was sent in for only 1 inhaler and not 3 month supply. She reports the RX sent in 03/31/14 for 3 inhalers, the pharmacy is telling her she can't get pt another inhaler for another 3 months but she had only received 1 inhaler. Now pt has now rescue inhaler per spouse. Also pt needed symbicort [facility] for [facility] month supply (I have done so). Adonis Brook is aware we will call with update once we find out something  I called [facility] to check on pt albuterol inhaler and had to [facility]

## 2014-04-20 NOTE — Telephone Encounter (Signed)
I called [facility] and spoke with pharmacists vance. He looked into the issue and verified that the pt only received 1 inhaler. I advised that the correct # should be 3 for 3 months. He ran through the claim and was able to over ride the denial and medication will be shipped out.   I called and advised christie. Nothing further needed. [place], CMA

## 2014-05-19 ENCOUNTER — Encounter (HOSPITAL_COMMUNITY): Payer: Self-pay

## 2014-05-19 ENCOUNTER — Emergency Department (HOSPITAL_COMMUNITY)
Admission: EM | Admit: 2014-05-19 | Discharge: 2014-05-19 | Disposition: A | Discharge status: Home / Self Care | Payer: BLUE CROSS/BLUE SHIELD | Attending: Emergency Medicine | Admitting: Emergency Medicine

## 2014-05-19 ENCOUNTER — Emergency Department (HOSPITAL_COMMUNITY): Payer: BLUE CROSS/BLUE SHIELD

## 2014-05-19 DIAGNOSIS — M1990 Unspecified osteoarthritis, unspecified site: Secondary | ICD-10-CM | POA: Insufficient documentation

## 2014-05-19 DIAGNOSIS — Z87891 Personal history of nicotine dependence: Secondary | ICD-10-CM | POA: Diagnosis not present

## 2014-05-19 DIAGNOSIS — Z792 Long term (current) use of antibiotics: Secondary | ICD-10-CM | POA: Diagnosis not present

## 2014-05-19 DIAGNOSIS — J069 Acute upper respiratory infection, unspecified: Secondary | ICD-10-CM | POA: Insufficient documentation

## 2014-05-19 DIAGNOSIS — E669 Obesity, unspecified: Secondary | ICD-10-CM | POA: Insufficient documentation

## 2014-05-19 DIAGNOSIS — Z950 Presence of cardiac pacemaker: Secondary | ICD-10-CM | POA: Diagnosis not present

## 2014-05-19 DIAGNOSIS — Z8669 Personal history of other diseases of the nervous system and sense organs: Secondary | ICD-10-CM | POA: Insufficient documentation

## 2014-05-19 DIAGNOSIS — J45901 Unspecified asthma with (acute) exacerbation: Secondary | ICD-10-CM | POA: Diagnosis not present

## 2014-05-19 DIAGNOSIS — R0602 Shortness of breath: Secondary | ICD-10-CM | POA: Diagnosis not present

## 2014-05-19 DIAGNOSIS — Z79899 Other long term (current) drug therapy: Secondary | ICD-10-CM | POA: Diagnosis not present

## 2014-05-19 DIAGNOSIS — M797 Fibromyalgia: Secondary | ICD-10-CM | POA: Diagnosis not present

## 2014-05-19 DIAGNOSIS — R110 Nausea: Secondary | ICD-10-CM | POA: Insufficient documentation

## 2014-05-19 DIAGNOSIS — Z7951 Long term (current) use of inhaled steroids: Secondary | ICD-10-CM | POA: Insufficient documentation

## 2014-05-19 DIAGNOSIS — J9809 Other diseases of bronchus, not elsewhere classified: Secondary | ICD-10-CM | POA: Diagnosis not present

## 2014-05-19 DIAGNOSIS — E785 Hyperlipidemia, unspecified: Secondary | ICD-10-CM | POA: Insufficient documentation

## 2014-05-19 DIAGNOSIS — I4891 Unspecified atrial fibrillation: Secondary | ICD-10-CM | POA: Diagnosis not present

## 2014-05-19 DIAGNOSIS — I10 Essential (primary) hypertension: Secondary | ICD-10-CM | POA: Insufficient documentation

## 2014-05-19 DIAGNOSIS — E039 Hypothyroidism, unspecified: Secondary | ICD-10-CM | POA: Diagnosis not present

## 2014-05-19 DIAGNOSIS — I700 Atherosclerosis of aorta: Secondary | ICD-10-CM | POA: Diagnosis not present

## 2014-05-19 LAB — CBC WITH DIFFERENTIAL/PLATELET
Basophils Absolute: 0 10*3/uL (ref 0.0–0.1)
Basophils Relative: 0 % (ref 0–1)
Eosinophils Absolute: 0 10*3/uL (ref 0.0–0.7)
Eosinophils Relative: 0 % (ref 0–5)
HCT: 37.1 % — ABNORMAL LOW (ref 39.0–52.0)
Hemoglobin: 12.2 g/dL — ABNORMAL LOW (ref 13.0–17.0)
Lymphocytes Relative: 3 % — ABNORMAL LOW (ref 12–46)
Lymphs Abs: 0.4 10*3/uL — ABNORMAL LOW (ref 0.7–4.0)
MCH: 28.3 pg (ref 26.0–34.0)
MCHC: 32.9 g/dL (ref 30.0–36.0)
MCV: 86.1 fL (ref 78.0–100.0)
Monocytes Absolute: 0.3 10*3/uL (ref 0.1–1.0)
Monocytes Relative: 2 % — ABNORMAL LOW (ref 3–12)
Neutro Abs: 11.8 10*3/uL — ABNORMAL HIGH (ref 1.7–7.7)
Neutrophils Relative %: 95 % — ABNORMAL HIGH (ref 43–77)
Platelets: 225 10*3/uL (ref 150–400)
RBC: 4.31 MIL/uL (ref 4.22–5.81)
RDW: 14.7 % (ref 11.5–15.5)
WBC: 12.4 10*3/uL — ABNORMAL HIGH (ref 4.0–10.5)

## 2014-05-19 LAB — BASIC METABOLIC PANEL
Anion gap: 7 (ref 5–15)
BUN: 12 mg/dL (ref 6–23)
CO2: 28 mmol/L (ref 19–32)
Calcium: 9 mg/dL (ref 8.4–10.5)
Chloride: 100 mEq/L (ref 96–112)
Creatinine, Ser: 1 mg/dL (ref 0.50–1.35)
GFR calc Af Amer: 89 mL/min — ABNORMAL LOW (ref >90)
GFR calc non Af Amer: 77 mL/min — ABNORMAL LOW (ref >90)
Glucose, Bld: 180 mg/dL — ABNORMAL HIGH (ref 70–99)
Potassium: 3.3 mmol/L — ABNORMAL LOW (ref 3.5–5.1)
Sodium: 135 mmol/L (ref 135–145)

## 2014-05-19 LAB — I-STAT TROPONIN, ED: Troponin i, poc: 0.03 ng/mL (ref 0.00–0.08)

## 2014-05-19 MED ORDER — AZITHROMYCIN 250 MG PO TABS: 250.0000 mg | ORAL_TABLET | Freq: Every day | ORAL | Status: DC

## 2014-05-19 MED ORDER — POTASSIUM CHLORIDE CRYS ER 20 MEQ PO TBCR
40.0000 meq | EXTENDED_RELEASE_TABLET | Freq: Once | ORAL | Status: AC
  Administered 2014-05-19: 40 meq via ORAL
  Filled 2014-05-19: qty 2

## 2014-05-19 MED ORDER — ONDANSETRON 4 MG PO TBDP: 4.0000 mg | ORAL_TABLET | Freq: Three times a day (TID) | ORAL | Status: DC | PRN

## 2014-05-19 MED ORDER — ONDANSETRON HCL 4 MG/2ML IJ SOLN: 4.0000 mg | Freq: Once | INTRAMUSCULAR | Status: DC

## 2014-05-19 MED ORDER — HYDROCODONE-HOMATROPINE 5-1.5 MG/5ML PO SYRP: 5.0000 mL | ORAL_SOLUTION | Freq: Four times a day (QID) | ORAL | Status: DC | PRN

## 2014-05-19 NOTE — Discharge Instructions (Signed)
Take azithromycin as directed until gone. Take hycodan as needed for cough. Take zofran as needed for nausea. Refer to attached documents for more information. Follow up with your doctor.

## 2014-05-19 NOTE — ED Notes (Signed)
Pt c/o intermittent SOB, cough, congestion x 1 year and nausea x 1 month.  Chronic generalized body aches from fibromyalgia.  Pain score 9/10.  Pt reports that he is followed by a Pulmonologist and told that these symptoms are related to "asthma and other things."  Pt has been using an inhaler w/ "some" relief.  Sts nausea increases when he doesn't eat.

## 2014-05-19 NOTE — ED Notes (Signed)
Patient is resting comfortably. 

## 2014-05-19 NOTE — ED Provider Notes (Signed)
CSN: [id number]     Arrival date & time 05/19/14  1636 History   First MD Initiated Contact with Patient 05/19/14 1644     Chief Complaint  Patient presents with  . Shortness of Breath  . Nausea     (Consider location/radiation/quality/duration/timing/severity/associated sxs/prior Treatment) HPI Comments: Patient is a 64 year old male with a past medical history of afib, hypertension, 1st degree AV black, and asthma who presents with a 1 month history of cough, congestion, and nausea. Symptoms started gradually and have been intermittent since the onset. Patient reports a dry cough with associated nasal congestion. Patient sees a pulmonologist who states the symptoms are related to asthma. Patient denies any fevers. He reports associated intermittent SOB. No aggravating/alleviating factors. Patient denies any recent travel/surgery/immobilization, previous DVT/PE.    Past Medical History  Diagnosis Date  . Hypertension   . Hyperlipidemia   . Fibromyalgia   . Obesity   . Mitral regurgitation     trivial  . Aortic stenosis, mild   . Left atrial enlargement     LA size 67mm by echo 11/21/11  . Pacemaker 03/31/2012  . Heart murmur   . Asthma     "little bit" (03/31/2012)  . Exertional dyspnea   . Obstructive sleep apnea     mild by sleep study 2013  . Hypothyroidism     S/P radiation  . DJD (degenerative joint disease)   . Arthritis of hand     "just a little bit in both hands" (03/31/2012)  . Atrial fibrillation     dx '04; DCCV '04, placed on flecainide, failed DCCV 04/2010, flecainide stopped; s/p successful A.fib ablation 01/31/12  . First degree AV block     post ablation, ~466ms  . Second degree AV block    Past Surgical History  Procedure Laterality Date  . Finger tendon repair  1980's    "right little finger" (03/31/2012)  . US echocardiography  08/07/2009    EF 55-60%  . Doppler echocardiography  03/11/2002    EF 70-75%  . Cardiovascular stress test  03/12/2002    EF  48%, NO EVIDENCE OF ISCHEMIA  . Tee without cardioversion  01/30/2012    Procedure: TRANSESOPHAGEAL ECHOCARDIOGRAM (TEE);  Surgeon: Larey Dresser, MD;  Location: [facility];  Service: Cardiovascular;  Laterality: N/A;  ablation next day  . Pacemaker insertion  03/31/2012    STJ Accent DR pacemaker implanted by Dr Rayann Heman  . Atrial fibrillation ablation  01/30/2012    [facility] by Dr. Rayann Heman  . Lumbar disc surgery  1980's X2;  2000's  . Cardioversion  01/2012; 03/31/2012  . Atrial fibrillation ablation N/A 01/31/2012    Procedure: ATRIAL FIBRILLATION ABLATION;  Surgeon: Thompson Grayer, MD;  Location: [facility] CATH LAB;  Service: Cardiovascular;  Laterality: N/A;  . Cardioversion N/A 02/25/2012    Procedure: CARDIOVERSION;  Surgeon: Thompson Grayer, MD;  Location: [facility] CATH LAB;  Service: Cardiovascular;  Laterality: N/A;  . Permanent pacemaker insertion N/A 03/31/2012    Procedure: PERMANENT PACEMAKER INSERTION;  Surgeon: Thompson Grayer, MD;  Location: [facility] CATH LAB;  Service: Cardiovascular;  Laterality: N/A;  . Cardioversion N/A 03/31/2012    Procedure: CARDIOVERSION;  Surgeon: Thompson Grayer, MD;  Location: [facility] CATH LAB;  Service: Cardiovascular;  Laterality: N/A;  . Left and right heart catheterization with coronary angiogram N/A 10/27/2012    Procedure: LEFT AND RIGHT HEART CATHETERIZATION WITH CORONARY ANGIOGRAM;  Surgeon: Laverda Page, MD;  Location: [facility] CATH LAB;  Service: Cardiovascular;  Laterality: N/A;   Family History  Problem Relation Age of Onset  . Heart failure Mother   . Stroke Father    History  Substance Use Topics  . Smoking status: Former Smoker -- 0.50 packs/day for 15 years    Types: Cigarettes    Quit date: 07/18/1991  . Smokeless tobacco: Never Used  . Alcohol Use: Yes     Comment: 1-2 times a year    Review of Systems  Constitutional: Negative for fever, chills and fatigue.  HENT: Positive for congestion. Negative for trouble swallowing.   Eyes: Negative for visual disturbance.   Respiratory: Positive for cough. Negative for shortness of breath.   Cardiovascular: Negative for chest pain and palpitations.  Gastrointestinal: Positive for nausea. Negative for vomiting, abdominal pain and diarrhea.  Genitourinary: Negative for dysuria and difficulty urinating.  Musculoskeletal: Positive for myalgias. Negative for arthralgias and neck pain.  Skin: Negative for color change.  Neurological: Negative for dizziness and weakness.  Psychiatric/Behavioral: Negative for dysphoric mood.      Allergies  Meloxicam  Home Medications   Prior to Admission medications   Medication Sig Start Date End Date Taking? Authorizing Provider  albuterol (PROAIR HFA) 108 (90 BASE) MCG/ACT inhaler Inhale 2 puffs into the lungs every 6 (six) hours as needed for wheezing or shortness of breath. 03/31/14   Collene Gobble, MD  aliskiren (TEKTURNA) 300 MG tablet Take 300 mg by mouth daily.    Historical Provider, MD  amLODipine (NORVASC) 5 MG tablet Take 10 mg by mouth daily.     Historical Provider, MD  budesonide-formoterol (SYMBICORT) 160-4.5 MCG/ACT inhaler Inhale 2 puffs into the lungs 2 (two) times daily. 04/19/14   Collene Gobble, MD  carvedilol (COREG) 12.5 MG tablet Take 1 tablet (12.5 mg total) by mouth 2 (two) times daily. 01/11/14   Evans Lance, MD  Cholecalciferol (VITAMIN D) 2000 UNITS CAPS Take 2,000 Units by mouth daily.    Historical Provider, MD  cyclobenzaprine (FLEXERIL) 10 MG tablet Take 10 mg by mouth daily.  04/13/12   Historical Provider, MD  dabigatran (PRADAXA) 150 MG CAPS capsule Take 1 capsule (150 mg total) by mouth 2 (two) times daily with a meal. 01/11/14   Evans Lance, MD  doxycycline (MONODOX) 100 MG capsule Take 100 mg by mouth 2 (two) times daily.    Historical Provider, MD  DULoxetine (CYMBALTA) 60 MG capsule Take 60 mg by mouth daily.     Historical Provider, MD  fexofenadine (ALLEGRA) 180 MG tablet Take 1 tablet (180 mg total) by mouth daily. 03/01/14    Collene Gobble, MD  fluticasone (FLONASE) 50 MCG/ACT nasal spray Place 2 sprays into both nostrils 2 (two) times daily. 03/01/14   Collene Gobble, MD  HYDROcodone-acetaminophen (NORCO/VICODIN) 5-325 MG per tablet Take 1 tablet by mouth every 8 (eight) hours as needed.     Historical Provider, MD  levothyroxine (SYNTHROID, LEVOTHROID) 150 MCG tablet Take 150 mcg by mouth daily before breakfast.    Historical Provider, MD  Melatonin-Pyridoxine (MELATONEX) 3-10 MG TBCR Take 1 tablet by mouth at bedtime.    Historical Provider, MD  pantoprazole (PROTONIX) 40 MG tablet Take 40 mg by mouth daily.    Historical Provider, MD  predniSONE (DELTASONE) 10 MG tablet Take 4 tablets x 3 days, 3 tablets x3 days, 2 tablets x 3 days, 1 tablet x 3 days then stop Patient not taking: Reported on 03/31/2014 03/01/14   Collene Gobble, MD  promethazine (  PHENERGAN) 25 MG tablet Take 25 mg by mouth 2 (two) times daily.    Historical Provider, MD  promethazine-codeine (PHENERGAN WITH CODEINE) 6.25-10 MG/5ML syrup Take by mouth every 6 (six) hours as needed for cough.    Historical Provider, MD  rOPINIRole (REQUIP) 1 MG tablet Take 1 tablet (1 mg total) by mouth daily. 12/24/13   Collene Gobble, MD  rosuvastatin (CRESTOR) 10 MG tablet Take 10 mg by mouth daily.    Historical Provider, MD  traMADol (ULTRAM) 50 MG tablet Take 1 tablet by mouth daily.  10/01/13   Historical Provider, MD  valACYclovir (VALTREX) 1000 MG tablet Take 1 tablet by mouth every 12 (twelve) hours as needed. 04/13/13   Historical Provider, MD  valsartan (DIOVAN) 320 MG tablet Take 320 mg by mouth daily.    Historical Provider, MD  zolpidem (AMBIEN) 10 MG tablet Take 10 mg by mouth at bedtime. For sleep    Historical Provider, MD   BP 184/92 mmHg  Pulse 73  Temp(Src) 98.5 F (36.9 C) (Oral)  Resp 16  SpO2 98% Physical Exam  Constitutional: He is oriented to person, place, and time. He appears well-developed and well-nourished. No distress.  HENT:  Head:  Normocephalic and atraumatic.  Mouth/Throat: Oropharynx is clear and moist. No oropharyngeal exudate.  Eyes: Conjunctivae and EOM are normal. Pupils are equal, round, and reactive to light.  Neck: Normal range of motion.  Cardiovascular: Normal rate and regular rhythm.  Exam reveals no gallop and no friction rub.   No murmur heard. Pulmonary/Chest: Effort normal and breath sounds normal. He has no wheezes. He has no rales. He exhibits no tenderness.  Abdominal: Soft. He exhibits no distension. There is no tenderness. There is no rebound.  Musculoskeletal: Normal range of motion.  No lower extremity edema or calf tenderness to palpation.   Neurological: He is alert and oriented to person, place, and time. Coordination normal.  Speech is goal-oriented. Moves limbs without ataxia.   Skin: Skin is warm and dry.  Psychiatric: He has a normal mood and affect. His behavior is normal.  Nursing note and vitals reviewed.   ED Course  Procedures (including critical care time) Labs Review Labs Reviewed  CBC WITH DIFFERENTIAL - Abnormal; Notable for the following:    WBC 12.4 (*)    Hemoglobin 12.2 (*)    HCT 37.1 (*)    Neutrophils Relative % 95 (*)    Neutro Abs 11.8 (*)    Lymphocytes Relative 3 (*)    Lymphs Abs 0.4 (*)    Monocytes Relative 2 (*)    All other components within normal limits  BASIC METABOLIC PANEL - Abnormal; Notable for the following:    Potassium 3.3 (*)    Glucose, Bld 180 (*)    GFR calc non Af Amer 77 (*)    GFR calc Af Amer 89 (*)    All other components within normal limits  I-STAT TROPOININ, ED    Imaging Review Dg Chest 2 View  05/19/2014   CLINICAL DATA:  64 year old male with intermittent shortness of breath, cough and congestion for 1 year with nausea for 1 month. Generalized body aches.  EXAM: CHEST  2 VIEW  COMPARISON:  Chest x-ray 09/30/2013.  FINDINGS: Lung volumes are normal. No consolidative airspace disease. No pleural effusions. Mild diffuse  peribronchial cuffing. Thickening of the fissures on the lateral projection. Mild cephalization of the pulmonary vasculature. Heart size is mildly enlarged. Upper mediastinal contours are within normal limits.  Atherosclerosis in the thoracic aorta. Left-sided pacemaker device in position with lead tips projecting over the expected location of the right atrium and right ventricular apex.  IMPRESSION: 1. The appearance the chest suggests very mild interstitial pulmonary edema. 2. Atherosclerosis.   Electronically Signed   By: Vinnie Langton M.D.   On: 05/19/2014 17:38     EKG Interpretation   Date/Time:  Thursday May 19 2014 17:01:53 EST Ventricular Rate:  80 PR Interval:    QRS Duration: 94 QT Interval:  391 QTC Calculation: 451 R Axis:   86 Text Interpretation:  Atrial-sensed ventricular-paced complexes No further  rhythm analysis attempted due to paced rhythm Borderline right axis  deviation Nonspecific repol abnormality, diffuse leads , new since last  tracing Minimal ST elevation, anterolateral leads Borderline prolonged QT  interval pacing new since prior tracing Confirmed by KNAPP  MD-J, JON  ([id number]) on 05/19/2014 5:31:23 PM      MDM   Final diagnoses:  SOB (shortness of breath)  URI (upper respiratory infection)  Nausea    5:00 PM Chest xray, EKG, and labs pending. Vitals stable and patient afebrile.   6:46 PM Chest xray shows mild interstitial pulmonary edema without any other acute changes. Labs show mild elevated WBC at 12.4. Remaining labs unremarkable for acute changes. Patient will have azithromycin and hycodan for symptoms. Patient advised to follow up with PCP next week. Patient instructed to return to the ED with worsening or concerning symptoms.    Alvina Chou, PA-C 05/19/14 1853  Dorie Rank, MD 05/19/14 1904

## 2014-06-07 ENCOUNTER — Encounter: Payer: Self-pay | Admitting: *Deleted

## 2014-06-14 DIAGNOSIS — M797 Fibromyalgia: Secondary | ICD-10-CM | POA: Insufficient documentation

## 2014-06-14 DIAGNOSIS — G2581 Restless legs syndrome: Secondary | ICD-10-CM | POA: Insufficient documentation

## 2014-06-30 ENCOUNTER — Ambulatory Visit (INDEPENDENT_AMBULATORY_CARE_PROVIDER_SITE_OTHER): Payer: BLUE CROSS/BLUE SHIELD | Admitting: *Deleted

## 2014-06-30 DIAGNOSIS — I4819 Other persistent atrial fibrillation: Secondary | ICD-10-CM

## 2014-06-30 DIAGNOSIS — I440 Atrioventricular block, first degree: Secondary | ICD-10-CM

## 2014-06-30 DIAGNOSIS — R001 Bradycardia, unspecified: Secondary | ICD-10-CM

## 2014-06-30 DIAGNOSIS — I481 Persistent atrial fibrillation: Secondary | ICD-10-CM

## 2014-06-30 LAB — MDC_IDC_ENUM_SESS_TYPE_INCLINIC
Battery Remaining Longevity: 79.2 mo
Battery Voltage: 2.92 V
Brady Statistic RA Percent Paced: 7 %
Brady Statistic RV Percent Paced: 92 %
Date Time Interrogation Session: 20160303163932
Implantable Pulse Generator Model: 2210
Implantable Pulse Generator Serial Number: 7408461
Lead Channel Impedance Value: 400 Ohm
Lead Channel Impedance Value: 662.5 Ohm
Lead Channel Pacing Threshold Amplitude: 1 V
Lead Channel Pacing Threshold Amplitude: 1.25 V
Lead Channel Pacing Threshold Amplitude: 1.25 V
Lead Channel Pacing Threshold Pulse Width: 0.5 ms
Lead Channel Pacing Threshold Pulse Width: 0.8 ms
Lead Channel Pacing Threshold Pulse Width: 0.8 ms
Lead Channel Sensing Intrinsic Amplitude: 0.8 mV
Lead Channel Sensing Intrinsic Amplitude: 7 mV
Lead Channel Setting Pacing Amplitude: 2 V
Lead Channel Setting Pacing Amplitude: 2.5 V
Lead Channel Setting Pacing Pulse Width: 0.8 ms
Lead Channel Setting Sensing Sensitivity: 2 mV

## 2014-06-30 NOTE — Progress Notes (Signed)
Pacemaker check in clinic. Normal device function. Thresholds, sensing, impedances consistent with previous measurements. Device programmed to maximize longevity. No high ventricular rates noted. A-fib, + pradaxa.  Device programmed at appropriate safety margins. Histogram distribution appropriate for patient activity level. Device programmed to optimize intrinsic conduction. Estimated longevity 6.3 years.  Patient education completed.  ROV in June with Dr. Rayann Heman.

## 2014-07-14 ENCOUNTER — Encounter: Payer: Self-pay | Admitting: Cardiovascular Disease

## 2014-07-14 ENCOUNTER — Ambulatory Visit (INDEPENDENT_AMBULATORY_CARE_PROVIDER_SITE_OTHER): Payer: BLUE CROSS/BLUE SHIELD | Admitting: Cardiovascular Disease

## 2014-07-14 ENCOUNTER — Telehealth: Payer: Self-pay | Admitting: Nurse Practitioner

## 2014-07-14 VITALS — BP 160/82 | HR 74 | Ht 71.0 in | Wt 229.4 lb

## 2014-07-14 DIAGNOSIS — I10 Essential (primary) hypertension: Secondary | ICD-10-CM | POA: Diagnosis not present

## 2014-07-14 DIAGNOSIS — I481 Persistent atrial fibrillation: Secondary | ICD-10-CM

## 2014-07-14 DIAGNOSIS — R011 Cardiac murmur, unspecified: Secondary | ICD-10-CM

## 2014-07-14 DIAGNOSIS — I4819 Other persistent atrial fibrillation: Secondary | ICD-10-CM

## 2014-07-14 DIAGNOSIS — I270 Primary pulmonary hypertension: Secondary | ICD-10-CM | POA: Diagnosis not present

## 2014-07-14 DIAGNOSIS — E785 Hyperlipidemia, unspecified: Secondary | ICD-10-CM

## 2014-07-14 DIAGNOSIS — R0600 Dyspnea, unspecified: Secondary | ICD-10-CM

## 2014-07-14 DIAGNOSIS — I272 Pulmonary hypertension, unspecified: Secondary | ICD-10-CM

## 2014-07-14 MED ORDER — SPIRONOLACTONE 25 MG PO TABS: 25.0000 mg | ORAL_TABLET | Freq: Every day | ORAL | Status: DC

## 2014-07-14 MED ORDER — ATORVASTATIN CALCIUM 40 MG PO TABS: 40.0000 mg | ORAL_TABLET | Freq: Every day | ORAL | Status: DC

## 2014-07-14 NOTE — Telephone Encounter (Signed)
Spoke with patient's wife, Adonis Brook, who called to ask some details about patient's office visit today.  All questions were answered and Adonis Brook thanked me for the call.  I advised her to call back with questions or concerns.

## 2014-07-14 NOTE — Patient Instructions (Signed)
Your physician has recommended you make the following change in your medication:  START Aldactone 25 mg once daily START Atorvastatin 40 mg once daily  Your physician has requested that you have an echocardiogram. Echocardiography is a painless test that uses sound waves to create images of your heart. It provides your doctor with information about the size and shape of your heart and how well your heart's chambers and valves are working. This procedure takes approximately one hour. There are no restrictions for this procedure.   Your physician recommends that you return for lab work in: 1 week for basic metabolic panel  Your physician recommends that you schedule a follow-up appointment in: 4-6 weeks with Dr. Acie Fredrickson  Your physician recommends that you return for lab work in: 3 months for fasting cholesterol, liver, basic metabolic panel You will need to be fasting for this appointment (nothing but water after midnight)

## 2014-07-14 NOTE — Progress Notes (Signed)
Cardiology Office Note   Date:  07/14/2014   ID:  DEMARR KLUEVER, DOB Apr 20, 1951, MRN [id number]  PCP:  [name], MD  Cardiologist:   Thayer Headings, MD   Chief Complaint  Patient presents with  . Hypertension   Problem List: 1. Essential hypertension 2. Pacer - Allred  3. Chest pain  - Normal cors by cath in 2014 4. Pulmonary Hypertension 5. Atrial fib - s/p Afib ablation 6. Hyperlipidemia-  7. Fibromyalgia - does not sleep well.      History of Present Illness: [name] is a 64 y.o. male who presents for HTN and shortness of breath. I saw him many years ago .  He has been seeing Dr. Einar Gip in the interim.   His main complaint today is that he has severe DOE with any exercise.  Has severe DOE walking 10-20 feet.  Then he has severe nausea with vomitting.    No CP .  Watches his salt.    Has a poor appetite recently  has not lost much weight.  Has fibromyalgia - does not sleep well. Does not have sleep apnea.   Has had several sleep studies.    Has leg and foot swelling - especially at night.  Better in the am .     Past Medical History  Diagnosis Date  . Hypertension   . Hyperlipidemia   . Fibromyalgia   . Obesity   . Mitral regurgitation     trivial  . Aortic stenosis, mild   . Left atrial enlargement     LA size 31mm by echo 11/21/11  . Pacemaker 03/31/2012  . Heart murmur   . Asthma     "little bit" (03/31/2012)  . Exertional dyspnea   . Obstructive sleep apnea     mild by sleep study 2013  . Hypothyroidism     S/P radiation  . DJD (degenerative joint disease)   . Arthritis of hand     "just a little bit in both hands" (03/31/2012)  . Atrial fibrillation     dx '04; DCCV '04, placed on flecainide, failed DCCV 04/2010, flecainide stopped; s/p successful A.fib ablation 01/31/12  . 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 Second degree AV block     Past Surgical History  Procedure Laterality Date  . Finger tendon repair   1980's    "right little finger" (03/31/2012)  . US echocardiography  08/07/2009    EF 55-60%  . Doppler echocardiography  03/11/2002    EF 70-75%  . Cardiovascular stress test  03/12/2002    EF 48%, NO EVIDENCE OF ISCHEMIA  . Tee without cardioversion  01/30/2012    Procedure: TRANSESOPHAGEAL ECHOCARDIOGRAM (TEE);  Surgeon: Larey Dresser, MD;  Location: [place];  Service: Cardiovascular;  Laterality: N/A;  ablation next day  . Pacemaker insertion  03/31/2012    STJ Accent DR pacemaker implanted by Dr Rayann Heman  . Atrial fibrillation ablation  01/30/2012    PVI by Dr. Rayann Heman  . Lumbar disc surgery  1980's X2;  2000's  . Cardioversion  01/2012; 03/31/2012  . Atrial fibrillation ablation N/A 01/31/2012    Procedure: ATRIAL FIBRILLATION ABLATION;  Surgeon: Thompson Grayer, MD;  Location: [facility] CATH LAB;  Service: Cardiovascular;  Laterality: N/A;  . Cardioversion N/A 02/25/2012    Procedure: CARDIOVERSION;  Surgeon: Thompson Grayer, MD;  Location: [facility] CATH LAB;  Service: Cardiovascular;  Laterality: N/A;  . Permanent pacemaker insertion N/A 03/31/2012  Procedure: PERMANENT PACEMAKER INSERTION;  Surgeon: Thompson Grayer, MD;  Location: [facility] CATH LAB;  Service: Cardiovascular;  Laterality: N/A;  . Cardioversion N/A 03/31/2012    Procedure: CARDIOVERSION;  Surgeon: Thompson Grayer, MD;  Location: [facility] CATH LAB;  Service: Cardiovascular;  Laterality: N/A;  . Left and right heart catheterization with coronary angiogram N/A 10/27/2012    Procedure: LEFT AND RIGHT HEART CATHETERIZATION WITH CORONARY ANGIOGRAM;  Surgeon: Laverda Page, MD;  Location: [facility] CATH LAB;  Service: Cardiovascular;  Laterality: N/A;     Current Outpatient Prescriptions  Medication Sig Dispense Refill  . albuterol (PROAIR HFA) 108 (90 BASE) MCG/ACT inhaler Inhale 2 puffs into the lungs every 6 (six) hours as needed for wheezing or shortness of breath. 3 Inhaler 3  . amLODipine (NORVASC) 5 MG tablet Take 10 mg by mouth daily.     .  budesonide-formoterol (SYMBICORT) 160-4.5 MCG/ACT inhaler Inhale 2 puffs into the lungs 2 (two) times daily. 3 Inhaler 3  . carvedilol (COREG) 12.5 MG tablet Take 1 tablet (12.5 mg total) by mouth 2 (two) times daily. 180 tablet 3  . Cholecalciferol (VITAMIN D) 2000 UNITS CAPS Take 2,000 Units by mouth daily.    . dabigatran (PRADAXA) 150 MG CAPS capsule Take 1 capsule (150 mg total) by mouth 2 (two) times daily with a meal. 180 capsule 3  . DULoxetine (CYMBALTA) 60 MG capsule Take 60 mg by mouth daily.     . fexofenadine (ALLEGRA) 180 MG tablet Take 1 tablet (180 mg total) by mouth daily. 30 tablet 1  . fluticasone (FLONASE) 50 MCG/ACT nasal spray Place 2 sprays into both nostrils 2 (two) times daily. 16 g 2  . HYDROcodone-acetaminophen (NORCO/VICODIN) 5-325 MG per tablet Take 1 tablet by mouth every 8 (eight) hours as needed.     [name] levothyroxine (SYNTHROID, LEVOTHROID) 150 MCG tablet Take 150 mcg by mouth daily before breakfast.    . Melatonin-Pyridoxine (MELATONEX) 3-10 MG TBCR Take 1 tablet by mouth at bedtime.    . metFORMIN (GLUCOPHAGE-XR) 500 MG 24 hr tablet Take 500 mg by mouth daily.  5  . ondansetron (ZOFRAN ODT) 4 MG disintegrating tablet Take 1 tablet (4 mg total) by mouth every 8 (eight) hours as needed for nausea or vomiting. 10 tablet 0  . pantoprazole (PROTONIX) 40 MG tablet Take 40 mg by mouth daily.    [name] rOPINIRole (REQUIP) 1 MG tablet Take 1 tablet (1 mg total) by mouth daily. 90 tablet 2  . traMADol (ULTRAM) 50 MG tablet Take 1 tablet by mouth daily.     . valACYclovir (VALTREX) 1000 MG tablet Take 1 tablet by mouth every 12 (twelve) hours as needed.    . valsartan (DIOVAN) 320 MG tablet Take 320 mg by mouth daily.    [name] zolpidem (AMBIEN) 10 MG tablet Take 10 mg by mouth at bedtime. For sleep    . HYDROcodone-homatropine (HYCODAN) 5-1.5 MG/5ML syrup Take 5 mLs by mouth every 6 (six) hours as needed for cough. (Patient not taking: Reported on 07/14/2014) 120 mL 0  . promethazine  (PHENERGAN) 25 MG tablet Take 25 mg by mouth 2 (two) times daily.    . rosuvastatin (CRESTOR) 10 MG tablet Take 10 mg by mouth daily.     No current facility-administered medications for this visit.    Allergies:   Meloxicam    Social History:  The patient  reports that he quit smoking about 23 years ago. His smoking use included Cigarettes. He has a 7.5 pack-year  smoking history. He has never used smokeless tobacco. He reports that he drinks alcohol. He reports that he does not use illicit drugs.   Family History:  The patient's family history includes Heart failure in his mother; Stroke in his father.    ROS:  Please see the history of present illness.    Review of Systems: Constitutional:  denies fever, chills, diaphoresis, appetite change and fatigue.  HEENT: denies photophobia, eye pain, redness, hearing loss, ear pain, congestion, sore throat, rhinorrhea, sneezing, neck pain, neck stiffness and tinnitus.  Respiratory: admits to SOB, DOE,    Cardiovascular: admits to   leg swelling.  Gastrointestinal: admits to nausea, vomiting, with walking   Genitourinary: denies dysuria, urgency, frequency, hematuria, flank pain and difficulty urinating.  Musculoskeletal: denies  myalgias, back pain, joint swelling, arthralgias and gait problem.   Skin: denies pallor, rash and wound.  Neurological: denies dizziness, seizures, syncope, weakness, light-headedness, numbness and headaches.   Hematological: denies adenopathy, easy bruising, personal or family bleeding history.  Psychiatric/ Behavioral: denies suicidal ideation, mood changes, confusion, nervousness, sleep disturbance and agitation.       All other systems are reviewed and negative.    PHYSICAL EXAM: VS:  BP 160/82 mmHg  Pulse 74  Ht 5\' 11"  (1.803 m)  Wt 229 lb 6.4 oz (104.055 kg)  BMI 32.01 kg/m2  SpO2 94% , BMI Body mass index is 32.01 kg/(m^2). GEN: Well nourished, well developed, in no acute distress HEENT:  normal Neck: no JVD, carotid bruits, or masses Cardiac: RRR; he has a 2/6 systolic murmur at the upper left sternal border. No , rubs, or gallops,no significant edema  Respiratory:  clear to auscultation bilaterally, normal work of breathing GI: soft, nontender, nondistended, + BS MS: no deformity or atrophy Skin: warm and dry, no rash Neuro:  Strength and sensation are intact Psych: normal   EKG:  EKG is not ordered today. The ekg ordered previous demonstrates :  LBBB    Recent Labs: 05/19/2014: BUN 12; Creatinine 1.00; Hemoglobin 12.2*; Platelets 225; Potassium 3.3*; Sodium 135    Lipid Panel No results found for: CHOL, TRIG, HDL, CHOLHDL, VLDL, LDLCALC, LDLDIRECT    Wt Readings from Last 3 Encounters:  07/14/14 229 lb 6.4 oz (104.055 kg)  03/31/14 235 lb (106.595 kg)  03/01/14 228 lb 6.4 oz (103.602 kg)      Other studies Reviewed: Additional studies/ records that were reviewed today include: . Review of the above records demonstrates:    ASSESSMENT AND PLAN:  1. Essential hypertension-  He continues to have HTN.  He's not on any diuretic. He took Tekturna in the past and thinks that he may have taken some HCTZ. His potassium level remains low despite the fact that he's been on Diovan. I suspect that he may have an excessive spurring in her lifetime. We'll start him on Aldactone 25 mg a day. We'll check a basic medical profile in one week.  2. Pacer - Allred   3. Chest pain  - Normal cors by cath in 2014  4. Pulmonary Hypertension- he has moderate Pulmonary HTN by cath - PA pressures in the 50. He has a significant murmur.  No evidence of sleep apnea.  Advised him to lose weight but I do not suspect that his weight is enough to cause this degree of P-HTN.  5. Atrial fib - s/p Afib ablation 6. Hyperlipidemia-   Will start him on Atorvastatin 40 .  Will check lipids in 3 months  7.  Fibromyalgia - does not sleep well.    Current medicines are reviewed at length with  the patient today.  The patient does not have concerns regarding medicines.  The following changes have been made:  no change  Labs/ tests ordered today include:  No orders of the defined types were placed in this encounter.     Disposition:   FU with me in 4-6 weeks     Signed, Nahser, Wonda Cheng, MD  07/14/2014 11:03 AM    [facility] [place], [place], [place]  [address] Phone: [phone]; Fax: [phone]

## 2014-07-15 ENCOUNTER — Encounter: Payer: Self-pay | Admitting: Internal Medicine

## 2014-07-19 ENCOUNTER — Other Ambulatory Visit (HOSPITAL_COMMUNITY): Payer: Medicare Other

## 2014-07-19 ENCOUNTER — Ambulatory Visit (HOSPITAL_COMMUNITY): Payer: BLUE CROSS/BLUE SHIELD | Attending: Cardiovascular Disease

## 2014-07-19 DIAGNOSIS — R011 Cardiac murmur, unspecified: Secondary | ICD-10-CM | POA: Insufficient documentation

## 2014-07-19 DIAGNOSIS — Z720 Tobacco use: Secondary | ICD-10-CM | POA: Diagnosis not present

## 2014-07-19 DIAGNOSIS — I272 Pulmonary hypertension, unspecified: Secondary | ICD-10-CM

## 2014-07-19 DIAGNOSIS — I10 Essential (primary) hypertension: Secondary | ICD-10-CM | POA: Insufficient documentation

## 2014-07-19 NOTE — Progress Notes (Signed)
2D Echo completed. 07/19/2014 

## 2014-07-21 ENCOUNTER — Other Ambulatory Visit (INDEPENDENT_AMBULATORY_CARE_PROVIDER_SITE_OTHER): Payer: BLUE CROSS/BLUE SHIELD | Admitting: *Deleted

## 2014-07-21 DIAGNOSIS — I10 Essential (primary) hypertension: Secondary | ICD-10-CM

## 2014-07-21 DIAGNOSIS — E785 Hyperlipidemia, unspecified: Secondary | ICD-10-CM

## 2014-07-21 LAB — BASIC METABOLIC PANEL
BUN: 15 mg/dL (ref 6–23)
CO2: 30 mEq/L (ref 19–32)
Calcium: 9 mg/dL (ref 8.4–10.5)
Chloride: 101 mEq/L (ref 96–112)
Creatinine, Ser: 1.09 mg/dL (ref 0.40–1.50)
GFR: 72.12 mL/min (ref >60.00)
Glucose, Bld: 104 mg/dL — ABNORMAL HIGH (ref 70–99)
Potassium: 3.5 mEq/L (ref 3.5–5.1)
Sodium: 137 mEq/L (ref 135–145)

## 2014-08-10 ENCOUNTER — Encounter (INDEPENDENT_AMBULATORY_CARE_PROVIDER_SITE_OTHER): Payer: Self-pay

## 2014-08-10 ENCOUNTER — Encounter: Payer: Self-pay | Admitting: Pulmonary Disease

## 2014-08-10 ENCOUNTER — Ambulatory Visit (INDEPENDENT_AMBULATORY_CARE_PROVIDER_SITE_OTHER): Payer: Medicare Other | Admitting: Pulmonary Disease

## 2014-08-10 VITALS — BP 160/84 | HR 74 | Temp 98.4°F | Ht 71.0 in | Wt 227.4 lb

## 2014-08-10 DIAGNOSIS — J4541 Moderate persistent asthma with (acute) exacerbation: Secondary | ICD-10-CM | POA: Diagnosis not present

## 2014-08-10 DIAGNOSIS — J45901 Unspecified asthma with (acute) exacerbation: Secondary | ICD-10-CM | POA: Insufficient documentation

## 2014-08-10 MED ORDER — PREDNISONE 10 MG PO TABS: ORAL_TABLET | ORAL | Status: DC

## 2014-08-10 NOTE — Assessment & Plan Note (Signed)
The patient has progressive dyspnea on exertion over the last 3-4 days, but does not have a lot of wheezing on exam. He feels this is consistent with his typical asthma flare, but he has also been taking on a little more fluid in his lower extremities and has a history of underlying heart disease. I will treat him with a short course of prednisone, but if he does not see significant improvement, I would think about whether this may represent "cardiac asthma". The patient knows to call if he is not getting better.

## 2014-08-10 NOTE — Progress Notes (Signed)
   Subjective:    Patient ID: [name], male    DOB: 02-27-1951, 64 y.o.   MRN: [id number]  HPI The patient comes in today for an acute sick visit. He has known asthma, and denies noncompliance with his maintenance medication. He gives a 3 to four-day history of increasing shortness of breath, and some mild wheezing. He has had minimal cough, no significant mucus production, and does not feel overly congested in his chest. He also notes that he has had some increasing lower extremity edema over the same time period.     Review of Systems  Constitutional: Negative for fever and unexpected weight change.  HENT: Positive for congestion and postnasal drip. Negative for dental problem, ear pain, nosebleeds, rhinorrhea, sinus pressure, sneezing, sore throat and trouble swallowing.   Eyes: Negative for redness and itching.  Respiratory: Positive for cough, chest tightness, shortness of breath and wheezing.   Cardiovascular: Positive for leg swelling. Negative for palpitations.  Gastrointestinal: Negative for nausea and vomiting.  Genitourinary: Negative for dysuria.  Musculoskeletal: Negative for joint swelling.  Skin: Negative for rash.  Neurological: Negative for headaches.  Hematological: Does not bruise/bleed easily.  Psychiatric/Behavioral: Negative for dysphoric mood. The patient is not nervous/anxious.        Objective:   Physical Exam Overweight male in no acute distress Nose without purulence or discharge noted Neck without lymphadenopathy or thyromegaly Chest with mild decrease in breath sounds, a few rhonchi, no definite wheezing. Cardiac exam with regular rate and rhythm, 3/6 systolic murmur Lower extremities with 1+ edema, no cyanosis Alert and oriented, moves all 4 extremities.       Assessment & Plan:

## 2014-08-10 NOTE — Patient Instructions (Signed)
Will treat you with a short course of prednisone to see if things improve.  If they do not, your symptoms may be coming from fluid retention and heart issues. No change in your daily asthma meds.  Keep followup with Dr. Lamonte Sakai.

## 2014-08-24 ENCOUNTER — Ambulatory Visit (INDEPENDENT_AMBULATORY_CARE_PROVIDER_SITE_OTHER): Payer: BLUE CROSS/BLUE SHIELD | Admitting: Cardiovascular Disease

## 2014-08-24 ENCOUNTER — Encounter: Payer: Self-pay | Admitting: Cardiovascular Disease

## 2014-08-24 VITALS — BP 138/80 | HR 70 | Ht 71.0 in | Wt 226.6 lb

## 2014-08-24 DIAGNOSIS — I10 Essential (primary) hypertension: Secondary | ICD-10-CM | POA: Diagnosis not present

## 2014-08-24 DIAGNOSIS — R0600 Dyspnea, unspecified: Secondary | ICD-10-CM

## 2014-08-24 DIAGNOSIS — I4819 Other persistent atrial fibrillation: Secondary | ICD-10-CM

## 2014-08-24 DIAGNOSIS — I481 Persistent atrial fibrillation: Secondary | ICD-10-CM | POA: Diagnosis not present

## 2014-08-24 MED ORDER — CARVEDILOL 25 MG PO TABS: 25.0000 mg | ORAL_TABLET | Freq: Two times a day (BID) | ORAL | Status: DC

## 2014-08-24 NOTE — Patient Instructions (Signed)
Medication Instructions:  INCREASE Carvedilol (Coreg) to 25 mg twice daily   Labwork: None  Testing/Procedures: None  Follow-Up: Your physician recommends that you schedule a follow-up appointment in: 3 months with Dr. Acie Fredrickson.

## 2014-08-24 NOTE — Progress Notes (Signed)
Cardiology Office Note   Date:  08/24/2014   ID:  [name], DOB 1951/01/17, MRN [id number]  PCP:  Velna Hatchet, MD  Cardiologist:   Thayer Headings, MD   Chief Complaint  Patient presents with  . Hypertension   Problem List: 1. Essential hypertension 2. Pacer - Allred  3. Chest pain  - Normal cors by cath in 2014 4. Pulmonary Hypertension 5. Atrial fib - s/p Afib ablation 6. Hyperlipidemia-  7. Fibromyalgia - does not sleep well.      History of Present Illness: [name] is a 64 y.o. male who presents for HTN and shortness of breath. I saw him many years ago .  He has been seeing Dr. Einar Gip in the interim.   His main complaint today is that he has severe DOE with any exercise.  Has severe DOE walking 10-20 feet.  Then he has severe nausea with vomitting.    No CP .  Watches his salt.    Has a poor appetite recently  has not lost much weight.  Has fibromyalgia - does not sleep well. Does not have sleep apnea.   Has had several sleep studies.    Has leg and foot swelling - especially at night.  Better in the am .   August 24, 2014:  [name] is doing better.   Still has some dyspnea with exertion.   We started Aldactone during the last visit.   His blood pressure is much better. He's able to walk further and he does not get at her breath quite as easily. He still become spray short of breath if he walks up a hill or walks too fast.  Past Medical History  Diagnosis Date  . Hypertension   . Hyperlipidemia   . Fibromyalgia   . Obesity   . Mitral regurgitation     trivial  . Aortic stenosis, mild   . Left atrial enlargement     LA size 58mm by echo 11/21/11  . Pacemaker 03/31/2012  . Heart murmur   . Asthma     "little bit" (03/31/2012)  . Exertional dyspnea   . Obstructive sleep apnea     mild by sleep study 2013  . Hypothyroidism     S/P radiation  . DJD (degenerative joint disease)   . Arthritis of hand     "just a little bit in both hands"  (03/31/2012)  . Atrial fibrillation     dx '04; DCCV '04, placed on flecainide, failed DCCV 04/2010, flecainide stopped; s/p successful A.fib ablation 01/31/12  . First degree AV block     post ablation, ~467ms  . Second degree AV block     Past Surgical History  Procedure Laterality Date  . Finger tendon repair  1980's    "right little finger" (03/31/2012)  . US echocardiography  08/07/2009    EF 55-60%  . Doppler echocardiography  03/11/2002    EF 70-75%  . Cardiovascular stress test  03/12/2002    EF 48%, NO EVIDENCE OF ISCHEMIA  . Tee without cardioversion  01/30/2012    Procedure: TRANSESOPHAGEAL ECHOCARDIOGRAM (TEE);  Surgeon: Larey Dresser, MD;  Location: [place];  Service: Cardiovascular;  Laterality: N/A;  ablation next day  . Pacemaker insertion  03/31/2012    STJ Accent DR pacemaker implanted by Dr Rayann Heman  . Atrial fibrillation ablation  01/30/2012    PVI by Dr. Rayann Heman  . Lumbar disc surgery  1980's X2;  2000's  . Cardioversion  01/2012; 03/31/2012  . Atrial fibrillation ablation N/A 01/31/2012    Procedure: ATRIAL FIBRILLATION ABLATION;  Surgeon: Thompson Grayer, MD;  Location: [facility] CATH LAB;  Service: Cardiovascular;  Laterality: N/A;  . Cardioversion N/A 02/25/2012    Procedure: CARDIOVERSION;  Surgeon: Thompson Grayer, MD;  Location: [facility] CATH LAB;  Service: Cardiovascular;  Laterality: N/A;  . Permanent pacemaker insertion N/A 03/31/2012    Procedure: PERMANENT PACEMAKER INSERTION;  Surgeon: Thompson Grayer, MD;  Location: [facility] CATH LAB;  Service: Cardiovascular;  Laterality: N/A;  . Cardioversion N/A 03/31/2012    Procedure: CARDIOVERSION;  Surgeon: Thompson Grayer, MD;  Location: [facility] CATH LAB;  Service: Cardiovascular;  Laterality: N/A;  . Left and right heart catheterization with coronary angiogram N/A 10/27/2012    Procedure: LEFT AND RIGHT HEART CATHETERIZATION WITH CORONARY ANGIOGRAM;  Surgeon: Laverda Page, MD;  Location: [facility] CATH LAB;  Service: Cardiovascular;  Laterality: N/A;       Current Outpatient Prescriptions  Medication Sig Dispense Refill  . albuterol (PROAIR HFA) 108 (90 BASE) MCG/ACT inhaler Inhale 2 puffs into the lungs every 6 (six) hours as needed for wheezing or shortness of breath. 3 Inhaler 3  . amLODipine (NORVASC) 5 MG tablet Take 10 mg by mouth daily.     [name] atorvastatin (LIPITOR) 40 MG tablet Take 1 tablet (40 mg total) by mouth daily. 31 tablet 11  . budesonide-formoterol (SYMBICORT) 160-4.5 MCG/ACT inhaler Inhale 2 puffs into the lungs 2 (two) times daily. 3 Inhaler 3  . carvedilol (COREG) 12.5 MG tablet Take 1 tablet (12.5 mg total) by mouth 2 (two) times daily. 180 tablet 3  . Cholecalciferol (VITAMIN D) 2000 UNITS CAPS Take 2,000 Units by mouth daily.    . dabigatran (PRADAXA) 150 MG CAPS capsule Take 1 capsule (150 mg total) by mouth 2 (two) times daily with a meal. 180 capsule 3  . DULoxetine (CYMBALTA) 60 MG capsule Take 60 mg by mouth daily.     . fexofenadine (ALLEGRA) 180 MG tablet Take 1 tablet (180 mg total) by mouth daily. 30 tablet 1  . fluticasone (FLONASE) 50 MCG/ACT nasal spray Place 2 sprays into both nostrils 2 (two) times daily. 16 g 2  . HYDROcodone-acetaminophen (NORCO/VICODIN) 5-325 MG per tablet Take 1 tablet by mouth every 8 (eight) hours as needed.     [name] levothyroxine (SYNTHROID, LEVOTHROID) 150 MCG tablet Take 150 mcg by mouth daily before breakfast.    . Melatonin-Pyridoxine (MELATONEX) 3-10 MG TBCR Take 1 tablet by mouth at bedtime.    . metFORMIN (GLUCOPHAGE-XR) 500 MG 24 hr tablet Take 500 mg by mouth daily.  5  . ondansetron (ZOFRAN ODT) 4 MG disintegrating tablet Take 1 tablet (4 mg total) by mouth every 8 (eight) hours as needed for nausea or vomiting. 10 tablet 0  . pantoprazole (PROTONIX) 40 MG tablet Take 40 mg by mouth daily.    [name] rOPINIRole (REQUIP) 1 MG tablet Take 1 tablet (1 mg total) by mouth daily. 90 tablet 2  . spironolactone (ALDACTONE) 25 MG tablet Take 1 tablet (25 mg total) by mouth daily. 31 tablet  11  . traMADol (ULTRAM) 50 MG tablet Take 1 tablet by mouth daily.     . valACYclovir (VALTREX) 1000 MG tablet Take 1 tablet by mouth every 12 (twelve) hours as needed.    . valsartan (DIOVAN) 320 MG tablet Take 320 mg by mouth daily.    [name] zolpidem (AMBIEN) 10 MG tablet Take 10 mg by mouth at bedtime. For sleep  No current facility-administered medications for this visit.    Allergies:   Meloxicam    Social History:  The patient  reports that he quit smoking about 23 years ago. His smoking use included Cigarettes. He has a 7.5 pack-year smoking history. He has never used smokeless tobacco. He reports that he drinks alcohol. He reports that he does not use illicit drugs.   Family History:  The patient's family history includes Heart failure in his mother; Stroke in his father.    ROS:  Please see the history of present illness.    Review of Systems: Constitutional:  denies fever, chills, diaphoresis, appetite change and fatigue.  HEENT: denies photophobia, eye pain, redness, hearing loss, ear pain, congestion, sore throat, rhinorrhea, sneezing, neck pain, neck stiffness and tinnitus.  Respiratory: admits to SOB, DOE,    Cardiovascular: admits to   leg swelling.  Gastrointestinal: admits to nausea, vomiting, with walking   Genitourinary: denies dysuria, urgency, frequency, hematuria, flank pain and difficulty urinating.  Musculoskeletal: denies  myalgias, back pain, joint swelling, arthralgias and gait problem.   Skin: denies pallor, rash and wound.  Neurological: denies dizziness, seizures, syncope, weakness, light-headedness, numbness and headaches.   Hematological: denies adenopathy, easy bruising, personal or family bleeding history.  Psychiatric/ Behavioral: denies suicidal ideation, mood changes, confusion, nervousness, sleep disturbance and agitation.       All other systems are reviewed and negative.    PHYSICAL EXAM: VS:  BP 138/80 mmHg  Pulse 70  Ht 5\' 11"  (1.803  m)  Wt 226 lb 9.6 oz (102.785 kg)  BMI 31.62 kg/m2 , BMI Body mass index is 31.62 kg/(m^2). GEN: Well nourished, well developed, in no acute distress HEENT: normal Neck: no JVD, carotid bruits, or masses Cardiac: RRR; he has a 2/6 systolic murmur at the upper left sternal border. No , rubs, or gallops,no significant edema  Respiratory:  clear to auscultation bilaterally, normal work of breathing GI: soft, nontender, nondistended, + BS MS: no deformity or atrophy Skin: warm and dry, no rash Neuro:  Strength and sensation are intact Psych: normal   EKG:  EKG is not ordered today. The ekg ordered previous demonstrates :  LBBB    Recent Labs: 05/19/2014: Hemoglobin 12.2*; Platelets 225 07/21/2014: BUN 15; Creatinine 1.09; Potassium 3.5; Sodium 137    Lipid Panel No results found for: CHOL, TRIG, HDL, CHOLHDL, VLDL, LDLCALC, LDLDIRECT    Wt Readings from Last 3 Encounters:  08/24/14 226 lb 9.6 oz (102.785 kg)  08/10/14 227 lb 6.4 oz (103.148 kg)  07/14/14 229 lb 6.4 oz (104.055 kg)      Other studies Reviewed: Additional studies/ records that were reviewed today include:  Review of the above records demonstrates:    ASSESSMENT AND PLAN:  1. Essential hypertension-   His blood pressure is much better on Aldactone. He still has occasional episodes of hypertension ( as noted on his home BP log)   We will increase the carvedilol to 25 mg twice a day. Urged him to avoid any extra salt.  2. Pacer - Allred   3. Chest pain  - Normal cors by cath in 2014  4. Pulmonary Hypertension- he has moderate Pulmonary HTN by cath - PA pressures in the 50. He has a significant murmur.  No evidence of sleep apnea.  Advised him to lose weight but I do not suspect that his weight is enough to cause this degree of P-HTN.  he has active wheezing today. I've encouraged him to see  his pulmonologist.  5. Atrial fib - s/p Afib ablation  6. Hyperlipidemia-   Will start him on Atorvastatin 40 .  Will  check lipids in 3 months   7. Fibromyalgia - does not sleep well.    Current medicines are reviewed at length with the patient today.  The patient does not have concerns regarding medicines.  The following changes have been made:  no change  Labs/ tests ordered today include:  No orders of the defined types were placed in this encounter.     Disposition:   FU with me in 3 months    Signed, Nahser, Wonda Cheng, MD  08/24/2014 2:59 PM    [facility] [place], [place], [place]  [address] Phone: [phone]; Fax: [phone]

## 2014-09-02 ENCOUNTER — Other Ambulatory Visit (INDEPENDENT_AMBULATORY_CARE_PROVIDER_SITE_OTHER): Payer: Medicare Other

## 2014-09-02 ENCOUNTER — Ambulatory Visit (INDEPENDENT_AMBULATORY_CARE_PROVIDER_SITE_OTHER): Payer: Medicare Other | Admitting: Adult Health

## 2014-09-02 ENCOUNTER — Encounter: Payer: Self-pay | Admitting: Adult Health

## 2014-09-02 ENCOUNTER — Ambulatory Visit (INDEPENDENT_AMBULATORY_CARE_PROVIDER_SITE_OTHER)
Admission: RE | Admit: 2014-09-02 | Discharge: 2014-09-02 | Disposition: A | Discharge status: Home / Self Care | Payer: Medicare Other | Source: Ambulatory Visit | Attending: Adult Health | Admitting: Adult Health

## 2014-09-02 VITALS — BP 118/62 | HR 70 | Temp 98.2°F | Ht 71.0 in | Wt 228.2 lb

## 2014-09-02 DIAGNOSIS — J45901 Unspecified asthma with (acute) exacerbation: Secondary | ICD-10-CM

## 2014-09-02 DIAGNOSIS — J4531 Mild persistent asthma with (acute) exacerbation: Secondary | ICD-10-CM | POA: Diagnosis not present

## 2014-09-02 DIAGNOSIS — I272 Pulmonary hypertension, unspecified: Secondary | ICD-10-CM

## 2014-09-02 DIAGNOSIS — R0600 Dyspnea, unspecified: Secondary | ICD-10-CM | POA: Diagnosis not present

## 2014-09-02 DIAGNOSIS — I270 Primary pulmonary hypertension: Secondary | ICD-10-CM

## 2014-09-02 LAB — BRAIN NATRIURETIC PEPTIDE: Pro B Natriuretic peptide (BNP): 329 pg/mL — ABNORMAL HIGH (ref 0.0–100.0)

## 2014-09-02 LAB — BASIC METABOLIC PANEL
BUN: 20 mg/dL (ref 6–23)
CO2: 28 mEq/L (ref 19–32)
Calcium: 9.7 mg/dL (ref 8.4–10.5)
Chloride: 100 mEq/L (ref 96–112)
Creatinine, Ser: 1.4 mg/dL (ref 0.40–1.50)
GFR: 54.01 mL/min — ABNORMAL LOW (ref >60.00)
Glucose, Bld: 107 mg/dL — ABNORMAL HIGH (ref 70–99)
Potassium: 4.6 mEq/L (ref 3.5–5.1)
Sodium: 136 mEq/L (ref 135–145)

## 2014-09-02 MED ORDER — LEVALBUTEROL HCL 0.63 MG/3ML IN NEBU
0.6300 mg | INHALATION_SOLUTION | Freq: Once | RESPIRATORY_TRACT | Status: AC
  Administered 2014-09-02: 0.63 mg via RESPIRATORY_TRACT

## 2014-09-02 MED ORDER — DOXYCYCLINE HYCLATE 100 MG PO TABS: 100.0000 mg | ORAL_TABLET | Freq: Two times a day (BID) | ORAL | Status: DC

## 2014-09-02 NOTE — Patient Instructions (Signed)
Increase Aldactone 25mg  Twice daily  For 3 days then back to daily dosing.  Low salt diet  Keep legs elevated.  Chest xray today and labs.  Doxycycline 100mg  Twice daily  For 1 week , take w/ food and wear sunscreen.  Mucinex DM Twice daily  As needed  Cough/congestion.  follow up Dr. Lamonte Sakai  As planned in 2 weeks and As needed   Please contact office for sooner follow up if symptoms do not improve or worsen or seek emergency care

## 2014-09-08 NOTE — Assessment & Plan Note (Addendum)
Suspect mild fluid overload  Increase diuretics briefly  Labs and cxr   pLAN  ncrease Aldactone 25mg  Twice daily  For 3 days then back to daily dosing.  Low salt diet  Keep legs elevated.     follow up Dr. Lamonte Sakai  As planned in 2 weeks and As needed   .Please contact office for sooner follow up if symptoms do not improve or worsen or seek emergency care

## 2014-09-08 NOTE — Assessment & Plan Note (Signed)
Doxycycline 100mg  Twice daily  For 1 week , take w/ food and wear sunscreen.  Mucinex DM Twice daily  As needed  Cough/congestion.  follow up Dr. Lamonte Sakai  As planned in 2 weeks and As needed   Please contact office for sooner follow up if symptoms do not improve or worsen or seek emergency care

## 2014-09-08 NOTE — Progress Notes (Signed)
   Subjective:  64 yo man, former smoker (7-8pk-yrs), hx obesity, HTN, mild AS, A Fib s/p ablation and rate control, mild OSA (by PSG '13), asthma dx several yrs ago by Dr Minna Antis. Has been experiencing exertional dyspnea for 2-3 yrs. He notices dyspnea with climbing a hill. He snores, moves his arms and legs continuously through the night. He has lost 20 lbs over last few months which has helped some.   Eval has included PFT that show mixed disease with a definite obstructive component based on a response to bronchodilator 08/05/13. Diffusion corrected for alveolar volume.  CPST and L/R caths as below.   Heart cath Dr Nadyne Coombes 7/14 >  Procedural data:  RA pressure 14/11 Mean 10 mm mercury. RA saturation NA.  RV pressure 48/0 and Right ventricular EDP 13 mm Hg.  PA pressure 51/14 with a mean of 40 mm mercury. PA saturation 72%.  Pulmonary capillary wedge 23/32 with a mean of 23 mm Hg. Aortic saturation 97%.  Cardiac output was 6.84 with cardiac index of 3.06 by Fick. Normal  09/02/14 Follow up  Pt returns for a 2 week follow up  Seen last ov with asthma flare, tx w/ pred pack .  Does feel some better but still gets winded easily  Still has cough with thick mucus , yellow at times.  Extremity edema persists.  Seen by cards for pulm HTN .  Started on aldactone recently.  No fever, discolored mucus, chest pain , orthopnea,     Review of Systems  See HPI     Objective:   Physical Exam  Gen: Pleasant, well-nourished, in no distress,  normal affect  ENT: No lesions,  mouth clear,  oropharynx clear, no postnasal drip  Neck: No JVD, no TMG, no carotid bruits  Lungs: No use of accessory muscles, clear without rales or rhonchi  Cardiovascular: RRR, heart sounds normal, no murmur or gallops,1+ peripheral edema  Musculoskeletal: No deformities, no cyanosis or clubbing  Neuro: alert, non focal  Skin: Warm, no lesions or rashes      Assessment & Plan:  I

## 2014-09-12 ENCOUNTER — Ambulatory Visit: Payer: Medicare Other | Admitting: Emergency Medicine

## 2014-09-13 ENCOUNTER — Encounter: Payer: Self-pay | Admitting: Cardiovascular Disease

## 2014-09-14 ENCOUNTER — Telehealth: Payer: Self-pay | Admitting: Nurse Practitioner

## 2014-09-14 DIAGNOSIS — I10 Essential (primary) hypertension: Secondary | ICD-10-CM

## 2014-09-14 MED ORDER — SPIRONOLACTONE 25 MG PO TABS: 25.0000 mg | ORAL_TABLET | Freq: Every day | ORAL | Status: DC

## 2014-09-14 MED ORDER — SPIRONOLACTONE 25 MG PO TABS: 25.0000 mg | ORAL_TABLET | Freq: Two times a day (BID) | ORAL | Status: DC

## 2014-09-14 NOTE — Telephone Encounter (Signed)
Received message from patient's wife who was in the office today.  Message states that Rexene Edison, NP with Dr. Lamonte Sakai advised patient to increase Spironolactone from 25 mg QD to 25 mg BID for 3 days beginning 09/02/14 to help with swelling in legs and feet.  Note states patient continues to take 25 mg BID.  His PCP has prescribed compression socks but did not address the Spironolactone dosage.  Patient would like to know what dose Dr. Acie Fredrickson would like him to take - 25 mg QD or 25 mg BID.  I am routing note to Dr. Acie Fredrickson for advice.

## 2014-09-14 NOTE — Telephone Encounter (Signed)
Im ok with increasing the dose to 50 bid. He will need BMP in a week

## 2014-09-14 NOTE — Telephone Encounter (Signed)
Left detailed message on wife's personal voice mail per instruction from previous note.  I advised that patient may continue increased dosage of Spironolactone 25 mg BID and that he needs lab appointment for bmet in 1 week.  I advised her to call the office and schedule lab appointment for one week.

## 2014-09-14 NOTE — Telephone Encounter (Signed)
Correction of note. OK to increase Aldactone to 25 mg BID Check BMP in 1 week

## 2014-09-15 ENCOUNTER — Telehealth: Payer: Self-pay | Admitting: Cardiovascular Disease

## 2014-09-15 DIAGNOSIS — I4819 Other persistent atrial fibrillation: Secondary | ICD-10-CM

## 2014-09-15 DIAGNOSIS — I10 Essential (primary) hypertension: Secondary | ICD-10-CM

## 2014-09-15 NOTE — Telephone Encounter (Signed)
Patient had difficulty understanding VM (phone issues). Provided the information from yesterday that Dr. Acie Fredrickson stated "okay for patient to take Spironolactone 25 mg twice daily and he needs repeat BMET in one week". Scheduled BMET for Thursday,

## 2014-09-15 NOTE — Telephone Encounter (Signed)
New problem   Pt need to speak to nurse regarding pt's medication. Please call

## 2014-09-20 ENCOUNTER — Encounter: Payer: Self-pay | Admitting: Emergency Medicine

## 2014-09-20 ENCOUNTER — Ambulatory Visit (INDEPENDENT_AMBULATORY_CARE_PROVIDER_SITE_OTHER): Payer: Medicare Other | Admitting: Emergency Medicine

## 2014-09-20 VITALS — BP 114/70 | HR 70 | Ht 71.0 in | Wt 231.0 lb

## 2014-09-20 DIAGNOSIS — R0600 Dyspnea, unspecified: Secondary | ICD-10-CM | POA: Diagnosis not present

## 2014-09-20 DIAGNOSIS — K219 Gastro-esophageal reflux disease without esophagitis: Secondary | ICD-10-CM | POA: Diagnosis not present

## 2014-09-20 DIAGNOSIS — G4730 Sleep apnea, unspecified: Secondary | ICD-10-CM

## 2014-09-20 MED ORDER — ALBUTEROL SULFATE HFA 108 (90 BASE) MCG/ACT IN AERS: 2.0000 | INHALATION_SPRAY | Freq: Four times a day (QID) | RESPIRATORY_TRACT | Status: DC | PRN

## 2014-09-20 NOTE — Assessment & Plan Note (Signed)
Suspect this is multifactorial and relates to deconditioning, obstructive and restrictive lung disease, hypertension and diastolic dysfunction, mild aortic stenosis, some degree of pulmonary hypertension. It is not clear to me that he is benefiting from his Symbicort and at this time given his cough and upper airway symptoms I like to do a trial off this medication. He knows to call me if he worsens. If so we will restart it

## 2014-09-20 NOTE — Assessment & Plan Note (Signed)
Increase pantoprazole to twice a day for one week and then decreased back down to daily. Hopefully this will help with his upper airway irritation and cough

## 2014-09-20 NOTE — Assessment & Plan Note (Signed)
We will stop his Symbicort temporarily to see if this benefits up her airway irritation Continue his aggressive regimen for allergic rhinitis, add nasal saline Continue  treating his GERD

## 2014-09-20 NOTE — Assessment & Plan Note (Signed)
Mild by polysomnogram in 2013. I believe he needs to have a repeat sleep study. Sleep apnea may be contributing to both his upper airway irritation and also his overall dyspnea and exertional dysfunction

## 2014-09-20 NOTE — Patient Instructions (Addendum)
Take albuterol 2 puffs up to every 4 hours if needed for shortness of breath. We will refill this prescription today Please stopped her Symbicort for now. If you discover that you missed this medication and you are more short of breath and call our office to discuss restarting Continue your fluticasone nasal spray and your Allegra Start using nasal saline washes once a day Increased her pantoprazole to 40 mg twice a day for one week. Then go back to 40 mg once a day We will perform a repeat sleep study Follow with Dr Lamonte Sakai in 2 months or sooner if you have any problems.

## 2014-09-20 NOTE — Progress Notes (Signed)
Subjective:    Patient ID: [name], male    DOB: 08-Oct-1950, 64 y.o.   MRN: [id number]  Shortness of Breath Associated symptoms include leg swelling. Pertinent negatives include no ear pain, fever, headaches, rash, rhinorrhea, sore throat, vomiting or wheezing.   64 yo man, former smoker (7-8pk-yrs), hx obesity, HTN, mild AS, A Fib s/p ablation and rate control, mild OSA (by PSG '13), asthma dx several yrs ago by Dr Minna Antis. Has been experiencing exertional dyspnea for 2-3 yrs. He notices dyspnea with climbing a hill. He snores, moves his arms and legs continuously through the night. He has lost 20 lbs over last few months which has helped some.   Eval has included PFT that show mixed disease with a definite obstructive component based on a response to bronchodilator 08/05/13. Diffusion corrected for alveolar volume.  CPST and L/R caths as below.   ROV 10/13/13 -- follows up for SOB on exertion. He has asthma, secondary PAH.  He underwent low prob V/q scan, ONO, walking oximetry.  He has lost some wt. He has noticed that if he exerts himself he still has SOB, then the next day he feels bad, nauseated. He remains on symbicort, feels that it helps him. Rarely uses albuterol.   ROV 03/01/14 -- follows up for SOB on exertion. He has asthma, secondary PAH.  2nd degree AV block. Since last time his pacer rate was increased > seemed to help him a lot. He complains today of increased cough and phlegm. Cough productive of light brown. He has seen Dr Minna Antis x 2, treated with steroids and doxycycline. Denies any GERD sx.   ROV 03/31/14 -- follow up visit for asthma, secondary PAH. We treated him last time for an AE. He is better but still having cough. He is out of allegra, doesn't use nasal steroid daily.  Phlegm is usually clear.   ROV 09/20/14 -- seen by Dr Gwenette Greet in 4/'16 and then TP in early May.  At that time was treated for bronchitis w doxy.  He is on symbicort bid, uses SABA about 5x a day. He is on  fluticasone and allegra. His largest persistent sx are exertional dyspnea. He has mild OSA by PSG 2013.  He is having frequent cough, bad at night. Significant nasal congestion. Over the 3 months he has had early satiety, stable wt.    CPST 07/14/13 >>  Conclusion: Exercise testing with gas exchange demonstrates a moderate functional limitation when compared to matched sedentary norms. The limitation to exercise appears multifactorial in nature due to his obesity and related restrictive lung physiology. There is also evidence of chronotropic incompetence and a mild circulatory limitation which may be related to diastolic dysfunction and pulmonary HTN. The limitation appears truly multifactorial and there does not appear to be one dominant factor at this point. Suspect his functional capacity may improve with weight loss and possible reduction in his AVN blockers.  Heart cath Dr Nadyne Coombes 7/14 >  Procedural data:  RA pressure 14/11 Mean 10 mm mercury. RA saturation NA.  RV pressure 48/0 and Right ventricular EDP 13 mm Hg.  PA pressure 51/14 with a mean of 40 mm mercury. PA saturation 72%.  Pulmonary capillary wedge 23/32 with a mean of 23 mm Hg. Aortic saturation 97%.  Cardiac output was 6.84 with cardiac index of 3.06 by Fick. Normal    Review of Systems  Constitutional: Positive for activity change and appetite change. Negative for fever and unexpected weight change.  HENT: Positive for congestion and sinus pressure. Negative for dental problem, ear pain, nosebleeds, postnasal drip, rhinorrhea, sneezing, sore throat and trouble swallowing.   Eyes: Negative for redness and itching.  Respiratory: Positive for cough and shortness of breath. Negative for chest tightness and wheezing.   Cardiovascular: Positive for leg swelling. Negative for palpitations.  Gastrointestinal: Negative for nausea and vomiting.  Genitourinary: Negative for dysuria.  Musculoskeletal: Negative for joint swelling.    Skin: Negative for rash.  Neurological: Negative for headaches.  Hematological: Does not bruise/bleed easily.  Psychiatric/Behavioral: Negative for dysphoric mood. The patient is not nervous/anxious.   All other systems reviewed and are negative.     Objective:   Physical Exam Filed Vitals:   09/20/14 1216 09/20/14 1217  BP:  114/70  Pulse:  70  Height: 5\' 11"  (1.803 m)   Weight: 231 lb (104.781 kg)   SpO2:  94%   Gen: Pleasant, well-nourished, in no distress,  normal affect  ENT: No lesions,  mouth clear,  oropharynx clear, no postnasal drip  Neck: No JVD, no TMG, no carotid bruits  Lungs: No use of accessory muscles, clear without rales or rhonchi  Cardiovascular: RRR, heart sounds normal, no murmur or gallops, no peripheral edema  Musculoskeletal: No deformities, no cyanosis or clubbing  Neuro: alert, non focal  Skin: Warm, no lesions or rashes      Assessment & Plan:  Dyspnea Suspect this is multifactorial and relates to deconditioning, obstructive and restrictive lung disease, hypertension and diastolic dysfunction, mild aortic stenosis, some degree of pulmonary hypertension. It is not clear to me that he is benefiting from his Symbicort and at this time given his cough and upper airway symptoms I like to do a trial off this medication. He knows to call me if he worsens. If so we will restart it   Sleep apnea Mild by polysomnogram in 2013. I believe he needs to have a repeat sleep study. Sleep apnea may be contributing to both his upper airway irritation and also his overall dyspnea and exertional dysfunction   Intrinsic asthma We will stop his Symbicort temporarily to see if this benefits up her airway irritation Continue his aggressive regimen for allergic rhinitis, add nasal saline Continue  treating his GERD   GERD (gastroesophageal reflux disease) Increase pantoprazole to twice a day for one week and then decreased back down to daily. Hopefully this will  help with his upper airway irritation and cough

## 2014-09-22 ENCOUNTER — Other Ambulatory Visit (INDEPENDENT_AMBULATORY_CARE_PROVIDER_SITE_OTHER): Payer: Medicare Other | Admitting: *Deleted

## 2014-09-22 DIAGNOSIS — I10 Essential (primary) hypertension: Secondary | ICD-10-CM | POA: Diagnosis not present

## 2014-09-22 DIAGNOSIS — I481 Persistent atrial fibrillation: Secondary | ICD-10-CM

## 2014-09-22 DIAGNOSIS — I4819 Other persistent atrial fibrillation: Secondary | ICD-10-CM

## 2014-09-22 LAB — BASIC METABOLIC PANEL
BUN: 20 mg/dL (ref 6–23)
CO2: 28 mEq/L (ref 19–32)
Calcium: 9.3 mg/dL (ref 8.4–10.5)
Chloride: 102 mEq/L (ref 96–112)
Creatinine, Ser: 1.37 mg/dL (ref 0.40–1.50)
GFR: 55.37 mL/min — ABNORMAL LOW (ref >60.00)
Glucose, Bld: 112 mg/dL — ABNORMAL HIGH (ref 70–99)
Potassium: 4.8 mEq/L (ref 3.5–5.1)
Sodium: 135 mEq/L (ref 135–145)

## 2014-10-03 ENCOUNTER — Other Ambulatory Visit (INDEPENDENT_AMBULATORY_CARE_PROVIDER_SITE_OTHER): Payer: Medicare Other | Admitting: *Deleted

## 2014-10-03 DIAGNOSIS — E785 Hyperlipidemia, unspecified: Secondary | ICD-10-CM

## 2014-10-03 DIAGNOSIS — I10 Essential (primary) hypertension: Secondary | ICD-10-CM | POA: Diagnosis not present

## 2014-10-03 LAB — LIPID PANEL
Cholesterol: 112 mg/dL (ref 0–200)
HDL: 26.8 mg/dL — ABNORMAL LOW (ref >39.00)
LDL Cholesterol: 51 mg/dL (ref 0–99)
NonHDL: 85.2
Total CHOL/HDL Ratio: 4
Triglycerides: 170 mg/dL — ABNORMAL HIGH (ref 0.0–149.0)
VLDL: 34 mg/dL (ref 0.0–40.0)

## 2014-10-03 LAB — BASIC METABOLIC PANEL
BUN: 21 mg/dL (ref 6–23)
CO2: 29 mEq/L (ref 19–32)
Calcium: 9.1 mg/dL (ref 8.4–10.5)
Chloride: 100 mEq/L (ref 96–112)
Creatinine, Ser: 1.7 mg/dL — ABNORMAL HIGH (ref 0.40–1.50)
GFR: 43.16 mL/min — ABNORMAL LOW (ref >60.00)
Glucose, Bld: 110 mg/dL — ABNORMAL HIGH (ref 70–99)
Potassium: 5 mEq/L (ref 3.5–5.1)
Sodium: 132 mEq/L — ABNORMAL LOW (ref 135–145)

## 2014-10-03 LAB — HEPATIC FUNCTION PANEL
ALT: 13 U/L (ref 0–53)
AST: 18 U/L (ref 0–37)
Albumin: 4 g/dL (ref 3.5–5.2)
Alkaline Phosphatase: 88 U/L (ref 39–117)
Bilirubin, Direct: 0.1 mg/dL (ref 0.0–0.3)
Total Bilirubin: 0.4 mg/dL (ref 0.2–1.2)
Total Protein: 6.9 g/dL (ref 6.0–8.3)

## 2014-10-05 ENCOUNTER — Encounter: Payer: Self-pay | Admitting: Internal Medicine

## 2014-10-05 ENCOUNTER — Ambulatory Visit (INDEPENDENT_AMBULATORY_CARE_PROVIDER_SITE_OTHER): Payer: Medicare Other | Admitting: Internal Medicine

## 2014-10-05 VITALS — BP 110/68 | HR 77 | Ht 71.0 in | Wt 231.6 lb

## 2014-10-05 DIAGNOSIS — I481 Persistent atrial fibrillation: Secondary | ICD-10-CM

## 2014-10-05 DIAGNOSIS — I10 Essential (primary) hypertension: Secondary | ICD-10-CM

## 2014-10-05 DIAGNOSIS — I441 Atrioventricular block, second degree: Secondary | ICD-10-CM

## 2014-10-05 DIAGNOSIS — I440 Atrioventricular block, first degree: Secondary | ICD-10-CM

## 2014-10-05 DIAGNOSIS — I4819 Other persistent atrial fibrillation: Secondary | ICD-10-CM

## 2014-10-05 DIAGNOSIS — R001 Bradycardia, unspecified: Secondary | ICD-10-CM | POA: Diagnosis not present

## 2014-10-05 LAB — CUP PACEART INCLINIC DEVICE CHECK
Battery Remaining Longevity: 76.8 mo
Battery Voltage: 2.92 V
Brady Statistic RA Percent Paced: 0.14 %
Brady Statistic RV Percent Paced: 97 %
Date Time Interrogation Session: 20160608125245
Lead Channel Impedance Value: 400 Ohm
Lead Channel Impedance Value: 650 Ohm
Lead Channel Pacing Threshold Amplitude: 1 V
Lead Channel Pacing Threshold Amplitude: 1.5 V
Lead Channel Pacing Threshold Pulse Width: 0.5 ms
Lead Channel Pacing Threshold Pulse Width: 0.8 ms
Lead Channel Sensing Intrinsic Amplitude: 0.6 mV
Lead Channel Sensing Intrinsic Amplitude: 6.3 mV
Lead Channel Setting Pacing Amplitude: 2 V
Lead Channel Setting Pacing Amplitude: 2.5 V
Lead Channel Setting Pacing Pulse Width: 0.8 ms
Lead Channel Setting Sensing Sensitivity: 2 mV
Pulse Gen Model: 2210
Pulse Gen Serial Number: 7408461

## 2014-10-05 MED ORDER — SPIRONOLACTONE 25 MG PO TABS: 25.0000 mg | ORAL_TABLET | Freq: Every day | ORAL | Status: DC

## 2014-10-05 MED ORDER — DABIGATRAN ETEXILATE MESYLATE 150 MG PO CAPS: 150.0000 mg | ORAL_CAPSULE | Freq: Two times a day (BID) | ORAL | Status: DC

## 2014-10-05 NOTE — Progress Notes (Signed)
Electrophysiology Office Note   Date:  10/05/2014   ID:  [name], DOB Feb 18, 1951, MRN [id number]  PCP:  Velna Hatchet, MD  Cardiologist:  Dr Acie Fredrickson Primary Electrophysiologist: Thompson Grayer, MD    Chief Complaint  Patient presents with  . Atrial fibrillation     History of Present Illness: [name] is a 64 y.o. male who presents today for electrophysiology evaluation.   Has chronic SOB and edema.  Activity is limited.  Today, he denies symptoms of palpitations, chest pain,  orthopnea, PND, claudication, dizziness, presyncope, syncope, bleeding, or neurologic sequela. The patient is tolerating medications without difficulties and is otherwise without complaint today.    Past Medical History  Diagnosis Date  . Hypertension   . Hyperlipidemia   . Fibromyalgia   . Obesity   . Mitral regurgitation     trivial  . Aortic stenosis, mild   . Left atrial enlargement     LA size 64mm by echo 11/21/11  . Pacemaker 03/31/2012  . Heart murmur   . Asthma     "little bit" (03/31/2012)  . Exertional dyspnea   . Obstructive sleep apnea     mild by sleep study 2013  . Hypothyroidism     S/P radiation  . DJD (degenerative joint disease)   . Arthritis of hand     "just a little bit in both hands" (03/31/2012)  . Atrial fibrillation     dx '04; DCCV '04, placed on flecainide, failed DCCV 04/2010, flecainide stopped; s/p successful A.fib ablation 01/31/12  . First degree AV block     post ablation, ~459ms  . Second degree AV block    Past Surgical History  Procedure Laterality Date  . Finger tendon repair  1980's    "right little finger" (03/31/2012)  . US echocardiography  08/07/2009    EF 55-60%  . Doppler echocardiography  03/11/2002    EF 70-75%  . Cardiovascular stress test  03/12/2002    EF 48%, NO EVIDENCE OF ISCHEMIA  . Tee without cardioversion  01/30/2012    Procedure: TRANSESOPHAGEAL ECHOCARDIOGRAM (TEE);  Surgeon: Larey Dresser, MD;  Location: [place];  Service: Cardiovascular;  Laterality: N/A;  ablation next day  . Pacemaker insertion  03/31/2012    STJ Accent DR pacemaker implanted by Dr Rayann Heman  . Atrial fibrillation ablation  01/30/2012    PVI by Dr. Rayann Heman  . Lumbar disc surgery  1980's X2;  2000's  . Cardioversion  01/2012; 03/31/2012  . Atrial fibrillation ablation N/A 01/31/2012    Procedure: ATRIAL FIBRILLATION ABLATION;  Surgeon: Thompson Grayer, MD;  Location: [facility] CATH LAB;  Service: Cardiovascular;  Laterality: N/A;  . Cardioversion N/A 02/25/2012    Procedure: CARDIOVERSION;  Surgeon: Thompson Grayer, MD;  Location: [facility] CATH LAB;  Service: Cardiovascular;  Laterality: N/A;  . Permanent pacemaker insertion N/A 03/31/2012    Procedure: PERMANENT PACEMAKER INSERTION;  Surgeon: Thompson Grayer, MD;  Location: [facility] CATH LAB;  Service: Cardiovascular;  Laterality: N/A;  . Cardioversion N/A 03/31/2012    Procedure: CARDIOVERSION;  Surgeon: Thompson Grayer, MD;  Location: [facility] CATH LAB;  Service: Cardiovascular;  Laterality: N/A;  . Left and right heart catheterization with coronary angiogram N/A 10/27/2012    Procedure: LEFT AND RIGHT HEART CATHETERIZATION WITH CORONARY ANGIOGRAM;  Surgeon: Laverda Page, MD;  Location: [facility] CATH LAB;  Service: Cardiovascular;  Laterality: N/A;     Current Outpatient Prescriptions  Medication Sig Dispense Refill  . albuterol (PROAIR HFA) 108 (  90 BASE) MCG/ACT inhaler Inhale 2 puffs into the lungs every 6 (six) hours as needed for wheezing or shortness of breath. 3 Inhaler 3  . amLODipine (NORVASC) 5 MG tablet Take 10 mg by mouth daily.     [name] atorvastatin (LIPITOR) 40 MG tablet Take 1 tablet (40 mg total) by mouth daily. 31 tablet 11  . budesonide-formoterol (SYMBICORT) 160-4.5 MCG/ACT inhaler Inhale 2 puffs into the lungs 2 (two) times daily. 3 Inhaler 3  . carvedilol (COREG) 25 MG tablet Take 1 tablet (25 mg total) by mouth 2 (two) times daily. 180 tablet 3  . Cholecalciferol (VITAMIN D) 2000 UNITS CAPS Take  2,000 Units by mouth daily.    . dabigatran (PRADAXA) 150 MG CAPS capsule Take 1 capsule (150 mg total) by mouth 2 (two) times daily with a meal. 180 capsule 3  . fexofenadine (ALLEGRA) 180 MG tablet Take 1 tablet (180 mg total) by mouth daily. 30 tablet 1  . fluticasone (FLONASE) 50 MCG/ACT nasal spray Place 2 sprays into both nostrils 2 (two) times daily. 16 g 2  . gabapentin (NEURONTIN) 300 MG capsule Take 1 capsule by mouth daily.  3  . HYDROcodone-acetaminophen (NORCO/VICODIN) 5-325 MG per tablet Take 1 tablet by mouth every 8 (eight) hours as needed.     [name] levothyroxine (SYNTHROID, LEVOTHROID) 150 MCG tablet Take 150 mcg by mouth daily before breakfast.    . Melatonin-Pyridoxine (MELATONEX) 3-10 MG TBCR Take 1 tablet by mouth at bedtime.    . metFORMIN (GLUCOPHAGE-XR) 500 MG 24 hr tablet Take 500 mg by mouth daily.  5  . ondansetron (ZOFRAN ODT) 4 MG disintegrating tablet Take 1 tablet (4 mg total) by mouth every 8 (eight) hours as needed for nausea or vomiting. 10 tablet 0  . pantoprazole (PROTONIX) 40 MG tablet Take 40 mg by mouth daily.    [name] rOPINIRole (REQUIP) 1 MG tablet Take 1 tablet (1 mg total) by mouth daily. 90 tablet 2  . spironolactone (ALDACTONE) 25 MG tablet Take 1 tablet (25 mg total) by mouth 2 (two) times daily. 60 tablet 11  . traMADol (ULTRAM) 50 MG tablet Take 1 tablet by mouth daily.     . valACYclovir (VALTREX) 1000 MG tablet Take 1 tablet by mouth every 12 (twelve) hours as needed.    . valsartan (DIOVAN) 320 MG tablet Take 320 mg by mouth daily.    [name] zolpidem (AMBIEN) 10 MG tablet Take 10 mg by mouth at bedtime. For sleep     No current facility-administered medications for this visit.    Allergies:   Meloxicam   Social History:  The patient  reports that he quit smoking about 23 years ago. His smoking use included Cigarettes. He has a 7.5 pack-year smoking history. He has never used smokeless tobacco. He reports that he drinks alcohol. He reports that he does not  use illicit drugs.   Family History:  The patient's family history includes Heart failure in his mother; Stroke in his father.    ROS:  Please see the history of present illness.   All other systems are reviewed and negative.    PHYSICAL EXAM: VS:  BP 110/68 mmHg  Pulse 77  Ht 5\' 11"  (1.803 m)  Wt 105.053 kg (231 lb 9.6 oz)  BMI 32.32 kg/m2  SpO2 98% , BMI Body mass index is 32.32 kg/(m^2). GEN: Well nourished, well developed, in no acute distress HEENT: normal Neck: no JVD, carotid bruits, or masses Cardiac: RRR (paced); 2/6 LSB,  rubs, or gallops,+2 edema  Respiratory:  clear to auscultation bilaterally, normal work of breathing GI: soft, nontender, nondistended, + BS MS: no deformity or atrophy Skin: warm and dry, device pocket is well healed Neuro:  Strength and sensation are intact Psych: euthymic mood, full affect  Device interrogation is reviewed today in detail.  See PaceArt for details.   Recent Labs: 05/19/2014: Hemoglobin 12.2*; Platelets 225 09/02/2014: Pro B Natriuretic peptide (BNP) 329.0* 10/03/2014: ALT 13; BUN 21; Creatinine, Ser 1.70*; Potassium 5.0; Sodium 132*    Lipid Panel     Component Value Date/Time   CHOL 112 10/03/2014 1148   TRIG 170.0* 10/03/2014 1148   HDL 26.80* 10/03/2014 1148   CHOLHDL 4 10/03/2014 1148   VLDL 34.0 10/03/2014 1148   LDLCALC 51 10/03/2014 1148     Wt Readings from Last 3 Encounters:  10/05/14 105.053 kg (231 lb 9.6 oz)  09/20/14 104.781 kg (231 lb)  09/02/14 103.511 kg (228 lb 3.2 oz)      Other studies Reviewed: Additional studies/ records that were reviewed today include: Dr Acie Fredrickson and [facility]'s notes    ASSESSMENT AND PLAN:   1. Second degree AV block Normal pacemaker function See Pace Art report  2. SOB Improved with mangement of lung disease by Dr Lamonte Sakai. Cardiac CT is reviewed and did not reveal any pulmonary vein stenosis. I have also reviewed his CPX which revealed primarily deconditioning and obesity.      3. afib In afib all of the time.  I am not sure that he is clinically any worse. Could consider tikosyn though with renal failure this may be difficult Given lung disease, would avoid amiodarone Continue current regimen Remains on pradaxa  4. htn Stable No change required today Weight loss and sodium restriction is advised  5. Obesity Weight loss is advised  Return in 1 year for follow-up Follow-up with Dr Acie Fredrickson as scheduled  [place]   Current medicines are reviewed at length with the patient today.   The patient does not have concerns regarding his medicines.  The following changes were made today:  none  Signed, Thompson Grayer, MD  10/05/2014 12:05 PM     [address] [place] [address] [phone] (office) [phone] (fax)

## 2014-10-05 NOTE — Patient Instructions (Addendum)
Medication Instructions:  1) Decrease Aldactone to 25 mg daily  Labwork: Your physician recommends that you return for lab work in: 3-4 weeks to check kidney function  Testing/Procedures: None ordered  Follow-Up: Your physician wants you to follow-up in: 12 months with Dr Vallery Ridge will receive a reminder letter in the mail two months in advance. If you don't receive a letter, please call our office to schedule the follow-up appointment.   Remote monitoring is used to monitor your Pacemaker or ICD from home. This monitoring reduces the number of office visits required to check your device to one time per year. It allows [place] to keep an eye on the functioning of your device to ensure it is working properly. You are scheduled for a device check from home on 01/04/15. You may send your transmission at any time that day. If you have a wireless device, the transmission will be sent automatically. After your physician reviews your transmission, you will receive a postcard with your next transmission date.    Any Other Special Instructions Will Be Listed Below (If Applicable).

## 2014-10-07 ENCOUNTER — Telehealth: Payer: Self-pay

## 2014-10-07 NOTE — Telephone Encounter (Signed)
Prior auth for Pradaxa 150mg  sent to [facility] via Cover My Meds.

## 2014-10-07 NOTE — Telephone Encounter (Signed)
Fax from [place], approving Pradaxa 150mg  through 10/07/2015. GD-#[id number]. Pharmacy notified.

## 2014-10-26 ENCOUNTER — Other Ambulatory Visit (INDEPENDENT_AMBULATORY_CARE_PROVIDER_SITE_OTHER): Payer: Medicare Other | Admitting: *Deleted

## 2014-10-26 DIAGNOSIS — I10 Essential (primary) hypertension: Secondary | ICD-10-CM | POA: Diagnosis not present

## 2014-10-26 LAB — BASIC METABOLIC PANEL
BUN: 24 mg/dL — ABNORMAL HIGH (ref 6–23)
CO2: 28 mEq/L (ref 19–32)
Calcium: 9.3 mg/dL (ref 8.4–10.5)
Chloride: 101 mEq/L (ref 96–112)
Creatinine, Ser: 1.67 mg/dL — ABNORMAL HIGH (ref 0.40–1.50)
GFR: 44.04 mL/min — ABNORMAL LOW (ref >60.00)
Glucose, Bld: 102 mg/dL — ABNORMAL HIGH (ref 70–99)
Potassium: 4.3 mEq/L (ref 3.5–5.1)
Sodium: 136 mEq/L (ref 135–145)

## 2014-11-23 ENCOUNTER — Encounter (HOSPITAL_BASED_OUTPATIENT_CLINIC_OR_DEPARTMENT_OTHER): Payer: Medicare Other

## 2014-11-24 ENCOUNTER — Ambulatory Visit: Payer: Medicare Other | Admitting: Emergency Medicine

## 2014-12-12 DIAGNOSIS — K429 Umbilical hernia without obstruction or gangrene: Secondary | ICD-10-CM | POA: Insufficient documentation

## 2014-12-25 NOTE — Progress Notes (Signed)
Cardiology Office Note   Date:  12/25/2014   ID:  [name], DOB 06-03-1950, MRN [id number]  PCP:  Victor Hatchet, MD  Cardiologist:   Victor Headings, MD   Chief Complaint  Patient presents with  . Hypertension   Problem List: 1. Essential hypertension 2. Pacer - [name]  3. Chest pain  - Normal cors by cath in 2014 4. Pulmonary Hypertension 5. Atrial fib - s/p Afib ablation ,  6. Hyperlipidemia-  7. Fibromyalgia - does not sleep well.      History of Present Illness: [name] is a 64 y.o. male who presents for HTN and shortness of breath. I saw him many years ago .  He has been seeing Dr. Einar [name] in the interim.   His main complaint today is that he has severe DOE with any exercise.  Has severe DOE walking 10-20 feet.  Then he has severe nausea with vomitting.    No CP .  Watches his salt.    Has a poor appetite recently  has not lost much weight.  Has fibromyalgia - does not sleep well. Does not have sleep apnea.   Has had several sleep studies.    Has leg and foot swelling - especially at night.  Better in the am .   August 24, 2014:  [name] is doing better.   Still has some dyspnea with exertion.   We started Aldactone during the last visit.   His blood pressure is much better. He's able to walk further and he does not get at her breath quite as easily. He still becomes short of breath if he walks up a hill or walks too fast.  Aug. 29, 2016:  [name] is doing well. Is sleeping better .  Still has episodes of weakness  No syncopal episodes,  Has some dizziness.  No CP or dyspnea.   Did not go for the sleep study because he is sleeping    Past Medical History  Diagnosis Date  . Hypertension   . Hyperlipidemia   . Fibromyalgia   . Obesity   . Mitral regurgitation     trivial  . Aortic stenosis, mild   . Left atrial enlargement     LA size 61mm by echo 11/21/11  . Pacemaker 03/31/2012  . Heart murmur   . Asthma     "little bit" (03/31/2012)  .  Exertional dyspnea   . Obstructive sleep apnea     mild by sleep study 2013  . Hypothyroidism     S/P radiation  . DJD (degenerative joint disease)   . Arthritis of hand     "just a little bit in both hands" (03/31/2012)  . Atrial fibrillation     dx '04; DCCV '04, placed on flecainide, failed DCCV 04/2010, flecainide stopped; s/p successful A.fib ablation 01/31/12  . 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 Second degree AV block     Past Surgical History  Procedure Laterality Date  . Finger tendon repair  1980's    "right little finger" (03/31/2012)  . US echocardiography  08/07/2009    EF 55-60%  . Doppler echocardiography  03/11/2002    EF 70-75%  . Cardiovascular stress test  03/12/2002    EF 48%, NO EVIDENCE OF ISCHEMIA  . Tee without cardioversion  01/30/2012    Procedure: TRANSESOPHAGEAL ECHOCARDIOGRAM (TEE);  Surgeon: Victor Dresser, MD;  Location: [facility];  Service: Cardiovascular;  Laterality: N/A;  ablation next day  . Pacemaker insertion  03/31/2012    STJ Accent DR pacemaker implanted by Dr [name]  . Atrial fibrillation ablation  01/30/2012    [facility] by Dr. Rayann [name]  . Lumbar disc surgery  1980's X2;  2000's  . Cardioversion  01/2012; 03/31/2012  . Atrial fibrillation ablation N/A 01/31/2012    Procedure: ATRIAL FIBRILLATION ABLATION;  Surgeon: Victor Grayer, MD;  Location: [facility] CATH LAB;  Service: Cardiovascular;  Laterality: N/A;  . Cardioversion N/A 02/25/2012    Procedure: CARDIOVERSION;  Surgeon: Victor Grayer, MD;  Location: [facility] CATH LAB;  Service: Cardiovascular;  Laterality: N/A;  . Permanent pacemaker insertion N/A 03/31/2012    Procedure: PERMANENT PACEMAKER INSERTION;  Surgeon: Victor Grayer, MD;  Location: [facility] CATH LAB;  Service: Cardiovascular;  Laterality: N/A;  . Cardioversion N/A 03/31/2012    Procedure: CARDIOVERSION;  Surgeon: Victor Grayer, MD;  Location: [facility] CATH LAB;  Service: Cardiovascular;  Laterality: N/A;  . Left and right heart catheterization  with coronary angiogram N/A 10/27/2012    Procedure: LEFT AND RIGHT HEART CATHETERIZATION WITH CORONARY ANGIOGRAM;  Surgeon: Laverda Page, MD;  Location: [facility] CATH LAB;  Service: Cardiovascular;  Laterality: N/A;     Current Outpatient Prescriptions  Medication Sig Dispense Refill  . albuterol (PROAIR HFA) 108 (90 BASE) MCG/ACT inhaler Inhale 2 puffs into the lungs every 6 (six) hours as needed for wheezing or shortness of breath. 3 Inhaler 3  . amLODipine (NORVASC) 5 MG tablet Take 10 mg by mouth daily.     [name] atorvastatin (LIPITOR) 40 MG tablet Take 1 tablet (40 mg total) by mouth daily. 31 tablet 11  . budesonide-formoterol (SYMBICORT) 160-4.5 MCG/ACT inhaler Inhale 2 puffs into the lungs 2 (two) times daily. 3 Inhaler 3  . carvedilol (COREG) 25 MG tablet Take 1 tablet (25 mg total) by mouth 2 (two) times daily. 180 tablet 3  . Cholecalciferol (VITAMIN D) 2000 UNITS CAPS Take 2,000 Units by mouth daily.    . dabigatran (PRADAXA) 150 MG CAPS capsule Take 1 capsule (150 mg total) by mouth 2 (two) times daily with a meal. 60 capsule 3  . fexofenadine (ALLEGRA) 180 MG tablet Take 1 tablet (180 mg total) by mouth daily. 30 tablet 1  . fluticasone (FLONASE) 50 MCG/ACT nasal spray Place 2 sprays into both nostrils 2 (two) times daily. 16 g 2  . gabapentin (NEURONTIN) 300 MG capsule Take 1 capsule by mouth daily.  3  . HYDROcodone-acetaminophen (NORCO/VICODIN) 5-325 MG per tablet Take 1 tablet by mouth every 8 (eight) hours as needed.     [name] levothyroxine (SYNTHROID, LEVOTHROID) 150 MCG tablet Take 150 mcg by mouth daily before breakfast.    . Melatonin-Pyridoxine (MELATONEX) 3-10 MG TBCR Take 1 tablet by mouth at bedtime.    . metFORMIN (GLUCOPHAGE-XR) 500 MG 24 hr tablet Take 500 mg by mouth daily.  5  . ondansetron (ZOFRAN ODT) 4 MG disintegrating tablet Take 1 tablet (4 mg total) by mouth every 8 (eight) hours as needed for nausea or vomiting. 10 tablet 0  . pantoprazole (PROTONIX) 40 MG tablet  Take 40 mg by mouth daily.    [name] rOPINIRole (REQUIP) 1 MG tablet Take 1 tablet (1 mg total) by mouth daily. 90 tablet 2  . spironolactone (ALDACTONE) 25 MG tablet Take 1 tablet (25 mg total) by mouth daily. 60 tablet 11  . traMADol (ULTRAM) 50 MG tablet Take 1 tablet by mouth daily.     . valACYclovir (VALTREX) 1000 MG  tablet Take 1 tablet by mouth every 12 (twelve) hours as needed.    . valsartan (DIOVAN) 320 MG tablet Take 320 mg by mouth daily.    [name] zolpidem (AMBIEN) 10 MG tablet Take 10 mg by mouth at bedtime. For sleep     No current facility-administered medications for this visit.    Allergies:   Meloxicam    Social History:  The patient  reports that he quit smoking about 23 years ago. His smoking use included Cigarettes. He has a 7.5 pack-year smoking history. He has never used smokeless tobacco. He reports that he drinks alcohol. He reports that he does not use illicit drugs.   Family History:  The patient's family history includes Heart failure in his mother; Stroke in his father.    ROS:  Please see the history of present illness.    Review of Systems: Constitutional:  denies fever, chills, diaphoresis, appetite change and fatigue.  HEENT: denies photophobia, eye pain, redness, hearing loss, ear pain, congestion, sore throat, rhinorrhea, sneezing, neck pain, neck stiffness and tinnitus.  Respiratory: admits to SOB, DOE,    Cardiovascular: admits to   leg swelling.  Gastrointestinal: admits to nausea, vomiting, with walking   Genitourinary: denies dysuria, urgency, frequency, hematuria, flank pain and difficulty urinating.  Musculoskeletal: denies  myalgias, back pain, joint swelling, arthralgias and gait problem.   Skin: denies pallor, rash and wound.  Neurological: denies dizziness, seizures, syncope, weakness, light-headedness, numbness and headaches.   Hematological: denies adenopathy, easy bruising, personal or family bleeding history.  Psychiatric/ Behavioral: denies  suicidal ideation, mood changes, confusion, nervousness, sleep disturbance and agitation.       All other systems are reviewed and negative.    PHYSICAL EXAM: VS:  There were no vitals taken for this visit. , BMI There is no weight on file to calculate BMI. GEN: Well nourished, well developed, in no acute distress HEENT: normal Neck: no JVD, carotid bruits, or masses Cardiac: RRR; he has a 2/6 systolic murmur at the upper left sternal border. No , rubs, or gallops,no significant edema  Respiratory:  clear to auscultation bilaterally, normal work of breathing GI: soft, nontender, nondistended, + BS MS: no deformity or atrophy Skin: warm and dry, no rash Neuro:  Strength and sensation are intact Psych: normal   EKG:  EKG is not ordered today. The ekg ordered previous demonstrates :  LBBB    Recent Labs: 05/19/2014: Hemoglobin 12.2*; Platelets 225 09/02/2014: Pro B Natriuretic peptide (BNP) 329.0* 10/03/2014: ALT 13 10/26/2014: BUN 24*; Creatinine, Ser 1.67*; Potassium 4.3; Sodium 136    Lipid Panel    Component Value Date/Time   CHOL 112 10/03/2014 1148   TRIG 170.0* 10/03/2014 1148   HDL 26.80* 10/03/2014 1148   CHOLHDL 4 10/03/2014 1148   VLDL 34.0 10/03/2014 1148   LDLCALC 51 10/03/2014 1148      Wt Readings from Last 3 Encounters:  10/05/14 105.053 kg (231 lb 9.6 oz)  09/20/14 104.781 kg (231 lb)  09/02/14 103.511 kg (228 lb 3.2 oz)      Other studies Reviewed: Additional studies/ records that were reviewed today include:  Review of the above records demonstrates:    ASSESSMENT AND PLAN:  1. Essential hypertension-  his blood pressure is well-controlled. The Diovan dose was reduced by his primary medical doctor because of some renal insufficiency. Continue current medications.  2. Aortic stenosis: He has moderate aortic stenosis by echo. He is not really having any symptoms at this point. His  wife thinks that he has some episodes of weakness after walking.  We'll recheck an echocardiogram in 6 months. I'll see him one week following the echo cardiac gram.  3. Chest pain  - Normal cors by cath in 2014  4. Pulmonary Hypertension- he has moderate Pulmonary HTN by cath - PA pressures in the 50. He has a significant murmur.  No evidence of sleep apnea.  Advised him to lose weight but I do not suspect that his weight is enough to cause this degree of P-HTN.  5. Atrial fib -has persistent atrial fibrillation by pacer interrogation. Continue Pradaxa.  6. Hyperlipidemia-  followed by his primary medical doctor  7. Fibromyalgia - he thinks that he is sleeping better.  He did not go for his sleep study .    Current medicines are reviewed at length with the patient today.  The patient does not have concerns regarding medicines.  The following changes have been made:  no change  Labs/ tests ordered today include:  No orders of the defined types were placed in this encounter.     Disposition:   FU with me in 6 months - 1 week after the echo .    Signed, Nahser, Wonda Cheng, MD  12/25/2014 9:28 PM    [address], [place], [place]  [address] Phone: [phone]; Fax: [phone]

## 2014-12-26 ENCOUNTER — Ambulatory Visit (INDEPENDENT_AMBULATORY_CARE_PROVIDER_SITE_OTHER): Payer: Medicare Other | Admitting: Cardiovascular Disease

## 2014-12-26 ENCOUNTER — Encounter: Payer: Self-pay | Admitting: Cardiovascular Disease

## 2014-12-26 VITALS — BP 112/76 | HR 71 | Ht 71.0 in | Wt 219.1 lb

## 2014-12-26 DIAGNOSIS — I10 Essential (primary) hypertension: Secondary | ICD-10-CM

## 2014-12-26 DIAGNOSIS — R0600 Dyspnea, unspecified: Secondary | ICD-10-CM | POA: Diagnosis not present

## 2014-12-26 DIAGNOSIS — I350 Nonrheumatic aortic (valve) stenosis: Secondary | ICD-10-CM | POA: Diagnosis not present

## 2014-12-26 DIAGNOSIS — I4819 Other persistent atrial fibrillation: Secondary | ICD-10-CM

## 2014-12-26 DIAGNOSIS — I481 Persistent atrial fibrillation: Secondary | ICD-10-CM | POA: Diagnosis not present

## 2014-12-26 NOTE — Patient Instructions (Signed)
Medication Instructions:  Your physician recommends that you continue on your current medications as directed. Please refer to the Current Medication list given to you today.  Labwork: NONE  Testing/Procedures: Your physician has requested that you have an echocardiogram. Echocardiography is a painless test that uses sound waves to create images of your heart. It provides your doctor with information about the size and shape of your heart and how well your heart's chambers and valves are working. This procedure takes approximately one hour. There are no restrictions for this procedure. 1 WEEK PRIOR TO 6 MONTH FOLLOW UP OV   Follow-Up: Your physician wants you to follow-up in: [place] will receive a reminder letter in the mail two months in advance. If you don't receive a letter, please call our office to schedule the follow-up appointment.

## 2015-01-04 ENCOUNTER — Ambulatory Visit (INDEPENDENT_AMBULATORY_CARE_PROVIDER_SITE_OTHER): Payer: Medicare Other | Admitting: *Deleted

## 2015-01-04 DIAGNOSIS — I441 Atrioventricular block, second degree: Secondary | ICD-10-CM

## 2015-01-05 NOTE — Progress Notes (Signed)
Remote pacemaker transmission.   

## 2015-01-20 LAB — CUP PACEART REMOTE DEVICE CHECK
Battery Remaining Longevity: 72 mo
Battery Remaining Percentage: 73 %
Battery Voltage: 2.92 V
Brady Statistic AP VP Percent: 0 %
Brady Statistic AP VS Percent: 0 %
Brady Statistic AS VP Percent: 0 %
Brady Statistic AS VS Percent: 0 %
Brady Statistic RA Percent Paced: 1 %
Brady Statistic RV Percent Paced: 99 %
Date Time Interrogation Session: 20160907070233
Lead Channel Impedance Value: 390 Ohm
Lead Channel Impedance Value: 630 Ohm
Lead Channel Pacing Threshold Amplitude: 1 V
Lead Channel Pacing Threshold Amplitude: 1.5 V
Lead Channel Pacing Threshold Pulse Width: 0.5 ms
Lead Channel Pacing Threshold Pulse Width: 0.8 ms
Lead Channel Sensing Intrinsic Amplitude: 0.6 mV
Lead Channel Sensing Intrinsic Amplitude: 5.6 mV
Lead Channel Setting Pacing Amplitude: 2 V
Lead Channel Setting Pacing Amplitude: 2.5 V
Lead Channel Setting Pacing Pulse Width: 0.8 ms
Lead Channel Setting Sensing Sensitivity: 2 mV
Pulse Gen Model: 2210
Pulse Gen Serial Number: 7408461

## 2015-01-27 ENCOUNTER — Encounter: Payer: Self-pay | Admitting: Cardiology

## 2015-02-08 ENCOUNTER — Encounter: Payer: Self-pay | Admitting: Internal Medicine

## 2015-02-27 ENCOUNTER — Ambulatory Visit: Payer: Medicare Other | Admitting: Pulmonary Disease

## 2015-03-02 ENCOUNTER — Other Ambulatory Visit: Payer: Self-pay | Admitting: Internal Medicine

## 2015-04-06 ENCOUNTER — Ambulatory Visit (INDEPENDENT_AMBULATORY_CARE_PROVIDER_SITE_OTHER): Payer: Medicare Other | Admitting: *Deleted

## 2015-04-06 ENCOUNTER — Telehealth: Payer: Self-pay | Admitting: Cardiology

## 2015-04-06 DIAGNOSIS — R001 Bradycardia, unspecified: Secondary | ICD-10-CM

## 2015-04-06 NOTE — Telephone Encounter (Signed)
Confirmed remote transmission w/ pt wife.   

## 2015-04-07 NOTE — Progress Notes (Signed)
Remote pacemaker transmission.   

## 2015-04-14 LAB — CUP PACEART REMOTE DEVICE CHECK
Battery Remaining Longevity: 74 mo
Battery Remaining Percentage: 73 %
Battery Voltage: 2.92 V
Brady Statistic AP VP Percent: 0 %
Brady Statistic AP VS Percent: 0 %
Brady Statistic AS VP Percent: 0 %
Brady Statistic AS VS Percent: 0 %
Brady Statistic RA Percent Paced: 1 %
Brady Statistic RV Percent Paced: 97 %
Date Time Interrogation Session: 20161208193057
Implantable Lead Implant Date: 20131203
Implantable Lead Implant Date: 20131203
Implantable Lead Location: 753859
Implantable Lead Location: 753860
Implantable Lead Model: 1948
Lead Channel Impedance Value: 430 Ohm
Lead Channel Impedance Value: 680 Ohm
Lead Channel Pacing Threshold Amplitude: 1 V
Lead Channel Pacing Threshold Amplitude: 1.5 V
Lead Channel Pacing Threshold Pulse Width: 0.5 ms
Lead Channel Pacing Threshold Pulse Width: 0.8 ms
Lead Channel Sensing Intrinsic Amplitude: 0.6 mV
Lead Channel Sensing Intrinsic Amplitude: 6.7 mV
Lead Channel Setting Pacing Amplitude: 2 V
Lead Channel Setting Pacing Amplitude: 2.5 V
Lead Channel Setting Pacing Pulse Width: 0.8 ms
Lead Channel Setting Sensing Sensitivity: 2 mV
Pulse Gen Model: 2210
Pulse Gen Serial Number: 7408461

## 2015-04-19 ENCOUNTER — Encounter: Payer: Self-pay | Admitting: Cardiology

## 2015-05-09 ENCOUNTER — Other Ambulatory Visit: Payer: Self-pay | Admitting: *Deleted

## 2015-05-09 ENCOUNTER — Telehealth: Payer: Self-pay | Admitting: Cardiovascular Disease

## 2015-05-09 MED ORDER — DABIGATRAN ETEXILATE MESYLATE 150 MG PO CAPS: ORAL_CAPSULE | ORAL | Status: DC

## 2015-05-09 NOTE — Telephone Encounter (Signed)
Spoke with patients wife and she stated that the copay for this medication on their new insurance is $95/month. They can get it a three month from their mail order pharmacy for $275. She has already paid this and an rx has was sent in earlier today.  Samples requested to get him through until mail order arrives. Wife aware samples will be placed at the front desk.

## 2015-05-09 NOTE — Telephone Encounter (Signed)
New message    Patient wife calling    Patient calling the office for samples of medication:   1.  What medication and dosage are you requesting samples for? praxada  150 mg twice a day    2.  Are you currently out of this medication? Yes

## 2015-06-12 ENCOUNTER — Other Ambulatory Visit (HOSPITAL_COMMUNITY): Payer: Self-pay | Admitting: Internal Medicine

## 2015-06-12 DIAGNOSIS — R112 Nausea with vomiting, unspecified: Secondary | ICD-10-CM

## 2015-06-19 ENCOUNTER — Ambulatory Visit (HOSPITAL_COMMUNITY)
Admission: RE | Admit: 2015-06-19 | Discharge: 2015-06-19 | Disposition: A | Discharge status: Home / Self Care | Payer: Medicare Other | Source: Ambulatory Visit | Attending: Internal Medicine | Admitting: Internal Medicine

## 2015-06-19 DIAGNOSIS — R112 Nausea with vomiting, unspecified: Secondary | ICD-10-CM | POA: Insufficient documentation

## 2015-06-19 MED ORDER — TECHNETIUM TC 99M SULFUR COLLOID
2.1000 | Freq: Once | INTRAVENOUS | Status: AC | PRN
  Administered 2015-06-19: 2.1 via ORAL

## 2015-07-06 ENCOUNTER — Ambulatory Visit (INDEPENDENT_AMBULATORY_CARE_PROVIDER_SITE_OTHER): Payer: Medicare Other | Admitting: *Deleted

## 2015-07-06 DIAGNOSIS — I440 Atrioventricular block, first degree: Secondary | ICD-10-CM | POA: Diagnosis not present

## 2015-07-07 NOTE — Progress Notes (Signed)
Remote pacemaker transmission.   

## 2015-07-08 LAB — CUP PACEART REMOTE DEVICE CHECK
Battery Remaining Longevity: 74 mo
Battery Remaining Percentage: 73 %
Battery Voltage: 2.92 V
Brady Statistic AP VP Percent: 0 %
Brady Statistic AP VS Percent: 0 %
Brady Statistic AS VP Percent: 0 %
Brady Statistic AS VS Percent: 0 %
Brady Statistic RA Percent Paced: 1 %
Brady Statistic RV Percent Paced: 97 %
Date Time Interrogation Session: 20170309075537
Implantable Lead Implant Date: 20131203
Implantable Lead Implant Date: 20131203
Implantable Lead Location: 753859
Implantable Lead Location: 753860
Implantable Lead Model: 1948
Lead Channel Impedance Value: 430 Ohm
Lead Channel Impedance Value: 680 Ohm
Lead Channel Pacing Threshold Amplitude: 1 V
Lead Channel Pacing Threshold Amplitude: 1.5 V
Lead Channel Pacing Threshold Pulse Width: 0.5 ms
Lead Channel Pacing Threshold Pulse Width: 0.8 ms
Lead Channel Sensing Intrinsic Amplitude: 0.6 mV
Lead Channel Sensing Intrinsic Amplitude: 7 mV
Lead Channel Setting Pacing Amplitude: 2 V
Lead Channel Setting Pacing Amplitude: 2.5 V
Lead Channel Setting Pacing Pulse Width: 0.8 ms
Lead Channel Setting Sensing Sensitivity: 2 mV
Pulse Gen Model: 2210
Pulse Gen Serial Number: 7408461

## 2015-07-08 NOTE — Progress Notes (Signed)
Normal remote reviewed. Consider VVIR at next OV   Next follow up 09/2015 in clinic

## 2015-07-12 ENCOUNTER — Encounter: Payer: Self-pay | Admitting: Cardiology

## 2015-07-19 ENCOUNTER — Other Ambulatory Visit: Payer: Self-pay | Admitting: *Deleted

## 2015-07-19 MED ORDER — ATORVASTATIN CALCIUM 40 MG PO TABS: 40.0000 mg | ORAL_TABLET | Freq: Every day | ORAL | Status: DC

## 2015-07-27 ENCOUNTER — Ambulatory Visit (HOSPITAL_COMMUNITY): Payer: Medicare Other | Attending: Cardiovascular Disease

## 2015-07-27 ENCOUNTER — Other Ambulatory Visit: Payer: Self-pay

## 2015-07-27 DIAGNOSIS — E785 Hyperlipidemia, unspecified: Secondary | ICD-10-CM | POA: Diagnosis not present

## 2015-07-27 DIAGNOSIS — I119 Hypertensive heart disease without heart failure: Secondary | ICD-10-CM | POA: Diagnosis not present

## 2015-07-27 DIAGNOSIS — I10 Essential (primary) hypertension: Secondary | ICD-10-CM | POA: Diagnosis not present

## 2015-07-27 DIAGNOSIS — G4733 Obstructive sleep apnea (adult) (pediatric): Secondary | ICD-10-CM | POA: Diagnosis not present

## 2015-07-27 DIAGNOSIS — I350 Nonrheumatic aortic (valve) stenosis: Secondary | ICD-10-CM | POA: Diagnosis not present

## 2015-08-03 ENCOUNTER — Encounter: Payer: Self-pay | Admitting: Cardiovascular Disease

## 2015-08-03 ENCOUNTER — Ambulatory Visit (INDEPENDENT_AMBULATORY_CARE_PROVIDER_SITE_OTHER): Payer: Medicare Other | Admitting: Cardiovascular Disease

## 2015-08-03 ENCOUNTER — Encounter: Payer: Self-pay | Admitting: Nurse Practitioner

## 2015-08-03 VITALS — BP 98/68 | HR 70 | Ht 71.0 in | Wt 216.0 lb

## 2015-08-03 DIAGNOSIS — I10 Essential (primary) hypertension: Secondary | ICD-10-CM

## 2015-08-03 DIAGNOSIS — I481 Persistent atrial fibrillation: Secondary | ICD-10-CM | POA: Diagnosis not present

## 2015-08-03 DIAGNOSIS — Z13220 Encounter for screening for lipoid disorders: Secondary | ICD-10-CM

## 2015-08-03 DIAGNOSIS — I4819 Other persistent atrial fibrillation: Secondary | ICD-10-CM

## 2015-08-03 DIAGNOSIS — I350 Nonrheumatic aortic (valve) stenosis: Secondary | ICD-10-CM

## 2015-08-03 LAB — CBC WITH DIFFERENTIAL/PLATELET
Basophils Absolute: 0 cells/uL (ref 0–200)
Basophils Relative: 0 %
Eosinophils Absolute: 156 cells/uL (ref 15–500)
Eosinophils Relative: 2 %
HCT: 38 % — ABNORMAL LOW (ref 38.5–50.0)
Hemoglobin: 12.3 g/dL — ABNORMAL LOW (ref 13.2–17.1)
Lymphocytes Relative: 16 %
Lymphs Abs: 1248 cells/uL (ref 850–3900)
MCH: 27.7 pg (ref 27.0–33.0)
MCHC: 32.4 g/dL (ref 32.0–36.0)
MCV: 85.6 fL (ref 80.0–100.0)
MPV: 9.6 fL (ref 7.5–12.5)
Monocytes Absolute: 702 cells/uL (ref 200–950)
Monocytes Relative: 9 %
Neutro Abs: 5694 cells/uL (ref 1500–7800)
Neutrophils Relative %: 73 %
Platelets: 202 10*3/uL (ref 140–400)
RBC: 4.44 MIL/uL (ref 4.20–5.80)
RDW: 14.7 % (ref 11.0–15.0)
WBC: 7.8 10*3/uL (ref 3.8–10.8)

## 2015-08-03 LAB — COMPREHENSIVE METABOLIC PANEL
ALT: 14 U/L (ref 9–46)
AST: 13 U/L (ref 10–35)
Albumin: 3.9 g/dL (ref 3.6–5.1)
Alkaline Phosphatase: 92 U/L (ref 40–115)
BUN: 23 mg/dL (ref 7–25)
CO2: 26 mmol/L (ref 20–31)
Calcium: 9.1 mg/dL (ref 8.6–10.3)
Chloride: 102 mmol/L (ref 98–110)
Creat: 1.63 mg/dL — ABNORMAL HIGH (ref 0.70–1.25)
Glucose, Bld: 168 mg/dL — ABNORMAL HIGH (ref 65–99)
Potassium: 4.2 mmol/L (ref 3.5–5.3)
Sodium: 135 mmol/L (ref 135–146)
Total Bilirubin: 0.3 mg/dL (ref 0.2–1.2)
Total Protein: 6.6 g/dL (ref 6.1–8.1)

## 2015-08-03 LAB — LIPID PANEL
Cholesterol: 114 mg/dL — ABNORMAL LOW (ref 125–200)
HDL: 30 mg/dL — ABNORMAL LOW (ref >=40)
LDL Cholesterol: 62 mg/dL (ref <130)
Total CHOL/HDL Ratio: 3.8 Ratio (ref <=5.0)
Triglycerides: 111 mg/dL (ref <150)
VLDL: 22 mg/dL (ref <30)

## 2015-08-03 LAB — PROTIME-INR
INR: 1.29 (ref <1.50)
Prothrombin Time: 16.3 seconds — ABNORMAL HIGH (ref 11.6–15.2)

## 2015-08-03 MED ORDER — AMLODIPINE BESYLATE 5 MG PO TABS: 5.0000 mg | ORAL_TABLET | Freq: Every day | ORAL | Status: DC

## 2015-08-03 MED ORDER — SPIRONOLACTONE 25 MG PO TABS: 12.5000 mg | ORAL_TABLET | Freq: Every day | ORAL | Status: DC

## 2015-08-03 NOTE — Patient Instructions (Signed)
Medication Instructions:  DECREASE Amlodipine to 5 mg daily DECREASE Aldactone to 12.5 mg daily   Labwork: TODAY - CBC, Pt/INR, complete metabolic panel, cholesterol   Testing/Procedures: Your physician has requested that you have a cardiac catheterization. Cardiac catheterization is used to diagnose and/or treat various heart conditions. Doctors may recommend this procedure for a number of different reasons. The most common reason is to evaluate chest pain. Chest pain can be a symptom of coronary artery disease (CAD), and cardiac catheterization can show whether plaque is narrowing or blocking your heart's arteries. This procedure is also used to evaluate the valves, as well as measure the blood flow and oxygen levels in different parts of your heart. For further information please visit [email or web address]. Please follow instruction sheet, as given.   Follow-Up: Your physician wants you to follow-up in: 6 months with Dr. Acie Fredrickson.  You will receive a reminder letter in the mail two months in advance. If you don't receive a letter, please call our office to schedule the follow-up appointment.   If you need a refill on your cardiac medications before your next appointment, please call your pharmacy.   Thank you for choosing [facility]! Christen Bame, RN [phone]

## 2015-08-03 NOTE — Progress Notes (Signed)
Cardiology Office Note   Date:  08/03/2015   ID:  [name], DOB [birth year], MRN [id number]  PCP:  Velna Hatchet, MD  Cardiologist:   Thayer Headings, MD   Chief Complaint  Patient presents with  . Nausea    sometimes vommit  . Dizziness    associated with nausea and vommiting  . Fatigue    always feel tired and weak.   . Leg Swelling    edema in lower legs and ankles   Problem List: 1. Aortic stenosis  2.   Essential hypertension 3. Chest pain  - Normal cors by cath in 2014 4. Pulmonary Hypertension 5. Atrial fib - s/p Afib ablation ,  S/p pacer  6. Hyperlipidemia-  7. Fibromyalgia - does not sleep well.      History of Present Illness: [name] is a 65 y.o. male who presents for HTN and shortness of breath. I saw him many years ago .  He has been seeing Dr. Einar Gip in the interim.   His main complaint today is that he has severe DOE with any exercise.  Has severe DOE walking 10-20 feet.  Then he has severe nausea with vomitting.    No CP .  Watches his salt.    Has a poor appetite recently  has not lost much weight.  Has fibromyalgia - does not sleep well. Does not have sleep apnea.   Has had several sleep studies.    Has leg and foot swelling - especially at night.  Better in the am .   August 24, 2014:  [name] is doing better.   Still has some dyspnea with exertion.   We started Aldactone during the last visit.   His blood pressure is much better. He's able to walk further and he does not get at her breath quite as easily. He still becomes short of breath if he walks up a hill or walks too fast.  Aug. 29, 2016:  [name] is doing well. Is sleeping better .  Still has episodes of weakness  No syncopal episodes,  Has some dizziness.  No CP or dyspnea.   Did not go for the sleep study because he is sleeping   August 03, 2015:  [name] is seen back today for follow up of his pulmonary HTN and HTN He has been feeling a bit weak and dizzy.  BP is  borderline low today   Recent echo shows normal LV function with severe AS He has minimal MR   Past Medical History  Diagnosis Date  . Hypertension   . Hyperlipidemia   . Fibromyalgia   . Obesity   . Mitral regurgitation     trivial  . Aortic stenosis, mild   . Left atrial enlargement     LA size 35mm by echo 11/21/11  . Pacemaker 03/31/2012  . Heart murmur   . Asthma     "little bit" (03/31/2012)  . Exertional dyspnea   . Obstructive sleep apnea     mild by sleep study 2013  . Hypothyroidism     S/P radiation  . DJD (degenerative joint disease)   . Arthritis of hand     "just a little bit in both hands" (03/31/2012)  . Atrial fibrillation     dx '04; DCCV '04, placed on flecainide, failed DCCV 04/2010, flecainide stopped; s/p successful A.fib ablation 01/31/12  . First degree AV block     post ablation, ~475ms  . Second degree AV  block     Past Surgical History  Procedure Laterality Date  . Finger tendon repair  1980's    "right little finger" (03/31/2012)  . US echocardiography  08/07/2009    EF 55-60%  . Doppler echocardiography  03/11/2002    EF 70-75%  . Cardiovascular stress test  03/12/2002    EF 48%, NO EVIDENCE OF ISCHEMIA  . Tee without cardioversion  01/30/2012    Procedure: TRANSESOPHAGEAL ECHOCARDIOGRAM (TEE);  Surgeon: Larey Dresser, MD;  Location: [place];  Service: Cardiovascular;  Laterality: N/A;  ablation next day  . Pacemaker insertion  03/31/2012    STJ Accent DR pacemaker implanted by Dr Rayann Heman  . Atrial fibrillation ablation  01/30/2012    [facility] by Dr. Rayann Heman  . Lumbar disc surgery  1980's X2;  2000's  . Cardioversion  01/2012; 03/31/2012  . Atrial fibrillation ablation N/A 01/31/2012    Procedure: ATRIAL FIBRILLATION ABLATION;  Surgeon: Thompson Grayer, MD;  Location: [facility] CATH LAB;  Service: Cardiovascular;  Laterality: N/A;  . Cardioversion N/A 02/25/2012    Procedure: CARDIOVERSION;  Surgeon: Thompson Grayer, MD;  Location: [facility] CATH LAB;  Service:  Cardiovascular;  Laterality: N/A;  . Permanent pacemaker insertion N/A 03/31/2012    Procedure: PERMANENT PACEMAKER INSERTION;  Surgeon: Thompson Grayer, MD;  Location: [facility] CATH LAB;  Service: Cardiovascular;  Laterality: N/A;  . Cardioversion N/A 03/31/2012    Procedure: CARDIOVERSION;  Surgeon: Thompson Grayer, MD;  Location: [facility] CATH LAB;  Service: Cardiovascular;  Laterality: N/A;  . Left and right heart catheterization with coronary angiogram N/A 10/27/2012    Procedure: LEFT AND RIGHT HEART CATHETERIZATION WITH CORONARY ANGIOGRAM;  Surgeon: Laverda Page, MD;  Location: [facility] CATH LAB;  Service: Cardiovascular;  Laterality: N/A;     Current Outpatient Prescriptions  Medication Sig Dispense Refill  . albuterol (PROAIR HFA) 108 (90 BASE) MCG/ACT inhaler Inhale 2 puffs into the lungs every 6 (six) hours as needed for wheezing or shortness of breath. 3 Inhaler 3  . amLODipine (NORVASC) 10 MG tablet Take 10 mg by mouth daily.    [name] atorvastatin (LIPITOR) 40 MG tablet Take 1 tablet (40 mg total) by mouth daily. 90 tablet 1  . carvedilol (COREG) 25 MG tablet Take 50 mg by mouth 2 (two) times daily with a meal.    . Cholecalciferol (VITAMIN D) 2000 UNITS CAPS Take 2,000 Units by mouth daily.    . dabigatran (PRADAXA) 150 MG CAPS capsule TAKE 1 CAPSULE(150 MG) BY MOUTH TWICE DAILY WITH A MEAL 180 capsule 3  . fexofenadine (ALLEGRA) 180 MG tablet Take 1 tablet (180 mg total) by mouth daily. 30 tablet 1  . fluticasone (FLONASE) 50 MCG/ACT nasal spray Place 2 sprays into both nostrils 2 (two) times daily. 16 g 2  . gabapentin (NEURONTIN) 300 MG capsule Take 1 capsule by mouth daily.  3  . levothyroxine (SYNTHROID, LEVOTHROID) 175 MCG tablet Take 175 mcg by mouth daily before breakfast.    . Melatonin-Pyridoxine (MELATONEX) 3-10 MG TBCR Take 1 tablet by mouth at bedtime.    . metFORMIN (GLUCOPHAGE-XR) 500 MG 24 hr tablet Take 500 mg by mouth daily.  5  . ondansetron (ZOFRAN ODT) 4 MG disintegrating tablet Take  1 tablet (4 mg total) by mouth every 8 (eight) hours as needed for nausea or vomiting. 10 tablet 0  . pantoprazole (PROTONIX) 40 MG tablet Take 40 mg by mouth daily.    [name] rOPINIRole (REQUIP) 1 MG tablet Take 1 tablet (1 mg total)  by mouth daily. 90 tablet 2  . spironolactone (ALDACTONE) 25 MG tablet Take 1 tablet (25 mg total) by mouth daily. 60 tablet 11  . traMADol (ULTRAM) 50 MG tablet Take 1 tablet by mouth daily.     . valACYclovir (VALTREX) 1000 MG tablet Take 1 tablet by mouth every 12 (twelve) hours as needed (pain).     . valsartan (DIOVAN) 320 MG tablet Take 160 mg by mouth daily.      No current facility-administered medications for this visit.    Allergies:   Meloxicam    Social History:  The patient  reports that he quit smoking about 24 years ago. His smoking use included Cigarettes. He has a 7.5 pack-year smoking history. He has never used smokeless tobacco. He reports that he drinks alcohol. He reports that he does not use illicit drugs.   Family History:  The patient's family history includes Heart failure in his mother; Stroke in his father.    ROS:  Please see the history of present illness.    Review of Systems: Constitutional:  denies fever, chills, diaphoresis, appetite change and fatigue.  HEENT: denies photophobia, eye pain, redness, hearing loss, ear pain, congestion, sore throat, rhinorrhea, sneezing, neck pain, neck stiffness and tinnitus.  Respiratory: admits to SOB, DOE,    Cardiovascular: admits to   leg swelling.  Gastrointestinal: admits to nausea, vomiting, with walking   Genitourinary: denies dysuria, urgency, frequency, hematuria, flank pain and difficulty urinating.  Musculoskeletal: denies  myalgias, back pain, joint swelling, arthralgias and gait problem.   Skin: denies pallor, rash and wound.  Neurological: denies dizziness, seizures, syncope, weakness, light-headedness, numbness and headaches.   Hematological: denies adenopathy, easy bruising,  personal or family bleeding history.  Psychiatric/ Behavioral: denies suicidal ideation, mood changes, confusion, nervousness, sleep disturbance and agitation.       All other systems are reviewed and negative.    PHYSICAL EXAM: VS:  BP 98/68 mmHg  Pulse 70  Ht 5\' 11"  (1.803 m)  Wt 216 lb (97.977 kg)  BMI 30.14 kg/m2  SpO2 97% , BMI Body mass index is 30.14 kg/(m^2). GEN: Well nourished, well developed, in no acute distress HEENT: normal Neck: no JVD, carotid bruits, or masses Cardiac: RRR; he has a 2/6 systolic murmur at the upper left sternal border. No , rubs, or gallops,no significant edema  Respiratory:  clear to auscultation bilaterally, normal work of breathing GI: soft, nontender, nondistended, + BS MS: no deformity or atrophy Skin: warm and dry, no rash Neuro:  Strength and sensation are intact Psych: normal   EKG:  EKG is ordered today. The ekg ordered today demonstrates :  Underlying atrial fib.   V pacing    Recent Labs: 09/02/2014: Pro B Natriuretic peptide (BNP) 329.0* 10/03/2014: ALT 13 10/26/2014: BUN 24*; Creatinine, Ser 1.67*; Potassium 4.3; Sodium 136    Lipid Panel    Component Value Date/Time   CHOL 112 10/03/2014 1148   TRIG 170.0* 10/03/2014 1148   HDL 26.80* 10/03/2014 1148   CHOLHDL 4 10/03/2014 1148   VLDL 34.0 10/03/2014 1148   LDLCALC 51 10/03/2014 1148      Wt Readings from Last 3 Encounters:  08/03/15 216 lb (97.977 kg)  12/26/14 219 lb 1.9 oz (99.392 kg)  10/05/14 231 lb 9.6 oz (105.053 kg)      Other studies Reviewed: Additional studies/ records that were reviewed today include:  Review of the above records demonstrates:    ASSESSMENT AND PLAN:  1. Essential hypertension-  his blood pressure is low today.   I suspect that his aortic stenosis is contributing to this . Will decrease the amlodipine to 5 mg a day and will decrease the Aldactone to 12. 5 a day    2. Aortic stenosis: His most recent echo reveals that his aortic  stenosis is now severe.    This likely is responsible for his symptoms  Will schedule him for a right and left heart  cath next week with Dr. [place] . He will need an appt with [facility] shortly after that for AVR. I do not think that he needs a TEE prior to AVR - we can get one if [facility] requests. He has mild MR that I think will improve after we correct the AS  3. Chest pain  - may be due to his aortic stenosis.  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 Einar Gip.   4. Pulmonary Hypertension- he has moderate Pulmonary HTN by cath - PA pressures in the 50. This may be due to / exacerbated by his aortic stenosis   5. Atrial fib -has persistent atrial fibrillation by pacer interrogation. Continue Pradaxa.   Will hold pradaxa for cath   6. Hyperlipidemia-  followed by his primary medical doctor  7. Fibromyalgia - he thinks that he is sleeping better.  He did not go for his sleep study .    Current medicines are reviewed at length with the patient today.  The patient does not have concerns regarding medicines.  The following changes have been made:  no change  Labs/ tests ordered today include:  No orders of the defined types were placed in this encounter.     Disposition:   Cardiac cath then likely AVR .  FU with me in 6 months  .    Signed, Nahser, Wonda Cheng, MD  08/03/2015 9:58 AM    [address], [place], [place]  [address] Phone: [phone]; Fax: [phone]

## 2015-08-09 ENCOUNTER — Encounter (HOSPITAL_COMMUNITY)
Admission: RE | Disposition: A | Discharge status: Home / Self Care | Payer: Self-pay | Source: Ambulatory Visit | Attending: Cardiology

## 2015-08-09 ENCOUNTER — Ambulatory Visit (HOSPITAL_COMMUNITY)
Admission: RE | Admit: 2015-08-09 | Discharge: 2015-08-09 | Disposition: A | Discharge status: Home / Self Care | Payer: Medicare Other | Source: Ambulatory Visit | Attending: Cardiology | Admitting: Cardiology

## 2015-08-09 ENCOUNTER — Encounter (HOSPITAL_COMMUNITY): Payer: Self-pay | Admitting: *Deleted

## 2015-08-09 DIAGNOSIS — Z950 Presence of cardiac pacemaker: Secondary | ICD-10-CM | POA: Diagnosis not present

## 2015-08-09 DIAGNOSIS — I350 Nonrheumatic aortic (valve) stenosis: Secondary | ICD-10-CM

## 2015-08-09 DIAGNOSIS — I481 Persistent atrial fibrillation: Secondary | ICD-10-CM | POA: Insufficient documentation

## 2015-08-09 DIAGNOSIS — M797 Fibromyalgia: Secondary | ICD-10-CM | POA: Diagnosis not present

## 2015-08-09 DIAGNOSIS — Z7984 Long term (current) use of oral hypoglycemic drugs: Secondary | ICD-10-CM | POA: Insufficient documentation

## 2015-08-09 DIAGNOSIS — Z87891 Personal history of nicotine dependence: Secondary | ICD-10-CM | POA: Insufficient documentation

## 2015-08-09 DIAGNOSIS — E785 Hyperlipidemia, unspecified: Secondary | ICD-10-CM | POA: Diagnosis not present

## 2015-08-09 DIAGNOSIS — I480 Paroxysmal atrial fibrillation: Secondary | ICD-10-CM | POA: Diagnosis present

## 2015-08-09 DIAGNOSIS — E039 Hypothyroidism, unspecified: Secondary | ICD-10-CM | POA: Insufficient documentation

## 2015-08-09 DIAGNOSIS — I10 Essential (primary) hypertension: Secondary | ICD-10-CM | POA: Diagnosis not present

## 2015-08-09 DIAGNOSIS — I272 Other secondary pulmonary hypertension: Secondary | ICD-10-CM | POA: Diagnosis not present

## 2015-08-09 HISTORY — PX: CARDIAC CATHETERIZATION: SHX172

## 2015-08-09 LAB — GLUCOSE, CAPILLARY
Glucose-Capillary: 82 mg/dL (ref 65–99)
Glucose-Capillary: 96 mg/dL (ref 65–99)

## 2015-08-09 LAB — POCT I-STAT 3, ART BLOOD GAS (G3+)
Acid-base deficit: 1 mmol/L (ref 0.0–2.0)
Bicarbonate: 25.6 mEq/L — ABNORMAL HIGH (ref 20.0–24.0)
O2 Saturation: 98 %
TCO2: 27 mmol/L (ref 0–100)
pCO2 arterial: 51.6 mmHg — ABNORMAL HIGH (ref 35.0–45.0)
pH, Arterial: 7.303 — ABNORMAL LOW (ref 7.350–7.450)
pO2, Arterial: 128 mmHg — ABNORMAL HIGH (ref 80.0–100.0)

## 2015-08-09 LAB — POCT I-STAT 3, VENOUS BLOOD GAS (G3P V)
Acid-base deficit: 2 mmol/L (ref 0.0–2.0)
Bicarbonate: 25.9 mEq/L — ABNORMAL HIGH (ref 20.0–24.0)
O2 Saturation: 66 %
TCO2: 28 mmol/L (ref 0–100)
pCO2, Ven: 59.2 mmHg — ABNORMAL HIGH (ref 45.0–50.0)
pH, Ven: 7.249 — ABNORMAL LOW (ref 7.250–7.300)
pO2, Ven: 41 mmHg (ref 31.0–45.0)

## 2015-08-09 SURGERY — RIGHT/LEFT HEART CATH AND CORONARY ANGIOGRAPHY

## 2015-08-09 MED ORDER — MIDAZOLAM HCL 2 MG/2ML IJ SOLN
INTRAMUSCULAR | Status: AC
  Filled 2015-08-09: qty 2

## 2015-08-09 MED ORDER — HEPARIN SODIUM (PORCINE) 1000 UNIT/ML IJ SOLN
INTRAMUSCULAR | Status: AC
  Filled 2015-08-09: qty 1

## 2015-08-09 MED ORDER — ASPIRIN 81 MG PO CHEW
81.0000 mg | CHEWABLE_TABLET | ORAL | Status: AC
  Administered 2015-08-09: 81 mg via ORAL

## 2015-08-09 MED ORDER — SODIUM CHLORIDE 0.9% FLUSH: 3.0000 mL | INTRAVENOUS | Status: DC | PRN

## 2015-08-09 MED ORDER — HEPARIN (PORCINE) IN NACL 2-0.9 UNIT/ML-% IJ SOLN
INTRAMUSCULAR | Status: DC | PRN
  Administered 2015-08-09: 1000 mL

## 2015-08-09 MED ORDER — LIDOCAINE HCL (PF) 1 % IJ SOLN
INTRAMUSCULAR | Status: DC | PRN
  Administered 2015-08-09 (×2): 2 mL via INTRADERMAL

## 2015-08-09 MED ORDER — FENTANYL CITRATE (PF) 100 MCG/2ML IJ SOLN
INTRAMUSCULAR | Status: AC
  Filled 2015-08-09: qty 2

## 2015-08-09 MED ORDER — SODIUM CHLORIDE 0.9 % WEIGHT BASED INFUSION: 1.0000 mL/kg/h | INTRAVENOUS | Status: DC

## 2015-08-09 MED ORDER — IOPAMIDOL (ISOVUE-370) INJECTION 76%
INTRAVENOUS | Status: DC | PRN
  Administered 2015-08-09: 70 mL via INTRAVENOUS

## 2015-08-09 MED ORDER — SODIUM CHLORIDE 0.9% FLUSH: 3.0000 mL | Freq: Two times a day (BID) | INTRAVENOUS | Status: DC

## 2015-08-09 MED ORDER — FENTANYL CITRATE (PF) 100 MCG/2ML IJ SOLN
INTRAMUSCULAR | Status: DC | PRN
  Administered 2015-08-09: 25 ug via INTRAVENOUS

## 2015-08-09 MED ORDER — METFORMIN HCL ER 500 MG PO TB24: 500.0000 mg | ORAL_TABLET | Freq: Every day | ORAL | Status: DC

## 2015-08-09 MED ORDER — SODIUM CHLORIDE 0.9 % IV SOLN: 250.0000 mL | INTRAVENOUS | Status: DC | PRN

## 2015-08-09 MED ORDER — HEPARIN SODIUM (PORCINE) 1000 UNIT/ML IJ SOLN
INTRAMUSCULAR | Status: DC | PRN
  Administered 2015-08-09: 5000 [IU] via INTRAVENOUS

## 2015-08-09 MED ORDER — VERAPAMIL HCL 2.5 MG/ML IV SOLN
INTRAVENOUS | Status: DC | PRN
  Administered 2015-08-09: 10 mL via INTRA_ARTERIAL

## 2015-08-09 MED ORDER — VERAPAMIL HCL 2.5 MG/ML IV SOLN
INTRAVENOUS | Status: AC
  Filled 2015-08-09: qty 2

## 2015-08-09 MED ORDER — MIDAZOLAM HCL 2 MG/2ML IJ SOLN
INTRAMUSCULAR | Status: DC | PRN
  Administered 2015-08-09: 1 mg via INTRAVENOUS

## 2015-08-09 MED ORDER — LIDOCAINE HCL (PF) 1 % IJ SOLN
INTRAMUSCULAR | Status: AC
  Filled 2015-08-09: qty 30

## 2015-08-09 MED ORDER — IOPAMIDOL (ISOVUE-370) INJECTION 76%
INTRAVENOUS | Status: AC
  Filled 2015-08-09: qty 100

## 2015-08-09 MED ORDER — SODIUM CHLORIDE 0.9 % WEIGHT BASED INFUSION
3.0000 mL/kg/h | INTRAVENOUS | Status: AC
  Administered 2015-08-09: 3 mL/kg/h via INTRAVENOUS

## 2015-08-09 MED ORDER — HEPARIN (PORCINE) IN NACL 2-0.9 UNIT/ML-% IJ SOLN
INTRAMUSCULAR | Status: AC
  Filled 2015-08-09: qty 1000

## 2015-08-09 MED ORDER — ASPIRIN 81 MG PO CHEW
CHEWABLE_TABLET | ORAL | Status: AC
  Filled 2015-08-09: qty 1

## 2015-08-09 MED ORDER — SODIUM CHLORIDE 0.9% FLUSH
3.0000 mL | INTRAVENOUS | Status: DC | PRN
Start: 2015-08-09 — End: 2015-08-09

## 2015-08-09 SURGICAL SUPPLY — 15 items
CATH BALLN WEDGE 5F 110CM (CATHETERS) ×2 IMPLANT
CATH INFINITI 5 FR JL3.5 (CATHETERS) ×2 IMPLANT
CATH INFINITI 5FR ANG PIGTAIL (CATHETERS) ×2 IMPLANT
CATH INFINITI JR4 5F (CATHETERS) ×2 IMPLANT
DEVICE RAD COMP TR BAND LRG (VASCULAR PRODUCTS) ×2 IMPLANT
GLIDESHEATH SLEND SS 6F .021 (SHEATH) ×2 IMPLANT
KIT HEART LEFT (KITS) ×2 IMPLANT
PACK CARDIAC CATHETERIZATION (CUSTOM PROCEDURE TRAY) ×2 IMPLANT
SHEATH FAST CATH BRACH 5F 5CM (SHEATH) ×2 IMPLANT
SYR MEDRAD MARK V 150ML (SYRINGE) ×2 IMPLANT
TRANSDUCER W/STOPCOCK (MISCELLANEOUS) ×2 IMPLANT
TUBING CIL FLEX 10 FLL-RA (TUBING) ×2 IMPLANT
TUBING CONTRAST HIGH PRESS 20 (MISCELLANEOUS) ×2 IMPLANT
WIRE EMERALD ST .035X260CM (WIRE) ×2 IMPLANT
WIRE SAFE-T 1.5MM-J .035X260CM (WIRE) ×2 IMPLANT

## 2015-08-09 NOTE — Discharge Instructions (Addendum)
May resume Pradaxa Thursday morning. Resume metformin Friday morning. Radial Site Care Refer to this sheet in the next few weeks. These instructions provide you with information about caring for yourself after your procedure. Your health care provider may also give you more specific instructions. Your treatment has been planned according to current medical practices, but problems sometimes occur. Call your health care provider if you have any problems or questions after your procedure. WHAT TO EXPECT AFTER THE PROCEDURE After your procedure, it is typical to have the following:  Bruising at the radial site that usually fades within 1-2 weeks.  Blood collecting in the tissue (hematoma) that may be painful to the touch. It should usually decrease in size and tenderness within 1-2 weeks. HOME CARE INSTRUCTIONS  Take medicines only as directed by your health care provider.  You may shower 24-48 hours after the procedure or as directed by your health care provider. Remove the bandage (dressing) and gently wash the site with plain soap and water. Pat the area dry with a clean towel. Do not rub the site, because this may cause bleeding.  Do not take baths, swim, or use a hot tub until your health care provider approves.  Check your insertion site every day for redness, swelling, or drainage.  Do not apply powder or lotion to the site.  Do not flex or bend the affected arm for 24 hours or as directed by your health care provider.  Do not push or pull heavy objects with the affected arm for 24 hours or as directed by your health care provider.  Do not lift over 10 lb (4.5 kg) for 5 days after your procedure or as directed by your health care provider.  Ask your health care provider when it is okay to:  Return to work or school.  Resume usual physical activities or sports.  Resume sexual activity.  Do not drive home if you are discharged the same day as the procedure. Have someone else drive  you.  You may drive 24 hours after the procedure unless otherwise instructed by your health care provider.  Do not operate machinery or power tools for 24 hours after the procedure.  If your procedure was done as an outpatient procedure, which means that you went home the same day as your procedure, a responsible adult should be with you for the first 24 hours after you arrive home.  Keep all follow-up visits as directed by your health care provider. This is important. SEEK MEDICAL CARE IF:  You have a fever.  You have chills.  You have increased bleeding from the radial site. Hold pressure on the site. SEEK IMMEDIATE MEDICAL CARE IF:  You have unusual pain at the radial site.  You have redness, warmth, or swelling at the radial site.  You have drainage (other than a small amount of blood on the dressing) from the radial site.  The radial site is bleeding, and the bleeding does not stop after 30 minutes of holding steady pressure on the site.  Your arm or hand becomes pale, cool, tingly, or numb.   This information is not intended to replace advice given to you by your health care provider. Make sure you discuss any questions you have with your health care provider.   Document Released: 05/18/2010 Document Revised: 05/06/2014 Document Reviewed: 11/01/2013 Elsevier Interactive Patient Education [employer].

## 2015-08-09 NOTE — H&P (View-Only) (Signed)
Cardiology Office Note   Date:  08/03/2015   ID:  MCCABE MARKOSKI, DOB 14-Mar-1951, MRN [id number]  PCP:  Velna Hatchet, MD  Cardiologist:   Thayer Headings, MD   Chief Complaint  Patient presents with  . Nausea    sometimes vommit  . Dizziness    associated with nausea and vommiting  . Fatigue    always feel tired and weak.   . Leg Swelling    edema in lower legs and ankles   Problem List: 1. Aortic stenosis  2.   Essential hypertension 3. Chest pain  - Normal cors by cath in 2014 4. Pulmonary Hypertension 5. Atrial fib - s/p Afib ablation ,  S/p pacer  6. Hyperlipidemia-  7. Fibromyalgia - does not sleep well.      History of Present Illness: [name] is a 65 y.o. male who presents for HTN and shortness of breath. I saw him many years ago .  He has been seeing Dr. Einar Gip in the interim.   His main complaint today is that he has severe DOE with any exercise.  Has severe DOE walking 10-20 feet.  Then he has severe nausea with vomitting.    No CP .  Watches his salt.    Has a poor appetite recently  has not lost much weight.  Has fibromyalgia - does not sleep well. Does not have sleep apnea.   Has had several sleep studies.    Has leg and foot swelling - especially at night.  Better in the am .   August 24, 2014:  [name] is doing better.   Still has some dyspnea with exertion.   We started Aldactone during the last visit.   His blood pressure is much better. He's able to walk further and he does not get at her breath quite as easily. He still becomes short of breath if he walks up a hill or walks too fast.  Aug. 29, 2016:  [name] is doing well. Is sleeping better .  Still has episodes of weakness  No syncopal episodes,  Has some dizziness.  No CP or dyspnea.   Did not go for the sleep study because he is sleeping   August 03, 2015:  [name] is seen back today for follow up of his pulmonary HTN and HTN He has been feeling a bit weak and dizzy.  BP is  borderline low today   Recent echo shows normal LV function with severe AS He has minimal MR   Past Medical History  Diagnosis Date  . Hypertension   . Hyperlipidemia   . Fibromyalgia   . Obesity   . Mitral regurgitation     trivial  . Aortic stenosis, mild   . Left atrial enlargement     LA size 72mm by echo 11/21/11  . Pacemaker 03/31/2012  . Heart murmur   . Asthma     "little bit" (03/31/2012)  . Exertional dyspnea   . Obstructive sleep apnea     mild by sleep study 2013  . Hypothyroidism     S/P radiation  . DJD (degenerative joint disease)   . Arthritis of hand     "just a little bit in both hands" (03/31/2012)  . Atrial fibrillation     dx '04; DCCV '04, placed on flecainide, failed DCCV 04/2010, flecainide stopped; s/p successful A.fib ablation 01/31/12  . First degree AV block     post ablation, ~450ms  . Second degree AV  block     Past Surgical History  Procedure Laterality Date  . Finger tendon repair  1980's    "right little finger" (03/31/2012)  . US echocardiography  08/07/2009    EF 55-60%  . Doppler echocardiography  03/11/2002    EF 70-75%  . Cardiovascular stress test  03/12/2002    EF 48%, NO EVIDENCE OF ISCHEMIA  . Tee without cardioversion  01/30/2012    Procedure: TRANSESOPHAGEAL ECHOCARDIOGRAM (TEE);  Surgeon: Larey Dresser, MD;  Location: [facility];  Service: Cardiovascular;  Laterality: N/A;  ablation next day  . Pacemaker insertion  03/31/2012    STJ Accent DR pacemaker implanted by Dr Rayann Heman  . Atrial fibrillation ablation  01/30/2012    [facility] by Dr. Rayann Heman  . Lumbar disc surgery  1980's X2;  2000's  . Cardioversion  01/2012; 03/31/2012  . Atrial fibrillation ablation N/A 01/31/2012    Procedure: ATRIAL FIBRILLATION ABLATION;  Surgeon: Thompson Grayer, MD;  Location: [facility] CATH LAB;  Service: Cardiovascular;  Laterality: N/A;  . Cardioversion N/A 02/25/2012    Procedure: CARDIOVERSION;  Surgeon: Thompson Grayer, MD;  Location: [facility] CATH LAB;  Service:  Cardiovascular;  Laterality: N/A;  . Permanent pacemaker insertion N/A 03/31/2012    Procedure: PERMANENT PACEMAKER INSERTION;  Surgeon: Thompson Grayer, MD;  Location: [facility] CATH LAB;  Service: Cardiovascular;  Laterality: N/A;  . Cardioversion N/A 03/31/2012    Procedure: CARDIOVERSION;  Surgeon: Thompson Grayer, MD;  Location: [facility] CATH LAB;  Service: Cardiovascular;  Laterality: N/A;  . Left and right heart catheterization with coronary angiogram N/A 10/27/2012    Procedure: LEFT AND RIGHT HEART CATHETERIZATION WITH CORONARY ANGIOGRAM;  Surgeon: Laverda Page, MD;  Location: [facility] CATH LAB;  Service: Cardiovascular;  Laterality: N/A;     Current Outpatient Prescriptions  Medication Sig Dispense Refill  . albuterol (PROAIR HFA) 108 (90 BASE) MCG/ACT inhaler Inhale 2 puffs into the lungs every 6 (six) hours as needed for wheezing or shortness of breath. 3 Inhaler 3  . amLODipine (NORVASC) 10 MG tablet Take 10 mg by mouth daily.    [name] atorvastatin (LIPITOR) 40 MG tablet Take 1 tablet (40 mg total) by mouth daily. 90 tablet 1  . carvedilol (COREG) 25 MG tablet Take 50 mg by mouth 2 (two) times daily with a meal.    . Cholecalciferol (VITAMIN D) 2000 UNITS CAPS Take 2,000 Units by mouth daily.    . dabigatran (PRADAXA) 150 MG CAPS capsule TAKE 1 CAPSULE(150 MG) BY MOUTH TWICE DAILY WITH A MEAL 180 capsule 3  . fexofenadine (ALLEGRA) 180 MG tablet Take 1 tablet (180 mg total) by mouth daily. 30 tablet 1  . fluticasone (FLONASE) 50 MCG/ACT nasal spray Place 2 sprays into both nostrils 2 (two) times daily. 16 g 2  . gabapentin (NEURONTIN) 300 MG capsule Take 1 capsule by mouth daily.  3  . levothyroxine (SYNTHROID, LEVOTHROID) 175 MCG tablet Take 175 mcg by mouth daily before breakfast.    . Melatonin-Pyridoxine (MELATONEX) 3-10 MG TBCR Take 1 tablet by mouth at bedtime.    . metFORMIN (GLUCOPHAGE-XR) 500 MG 24 hr tablet Take 500 mg by mouth daily.  5  . ondansetron (ZOFRAN ODT) 4 MG disintegrating tablet Take  1 tablet (4 mg total) by mouth every 8 (eight) hours as needed for nausea or vomiting. 10 tablet 0  . pantoprazole (PROTONIX) 40 MG tablet Take 40 mg by mouth daily.    [name] rOPINIRole (REQUIP) 1 MG tablet Take 1 tablet (1 mg total)  by mouth daily. 90 tablet 2  . spironolactone (ALDACTONE) 25 MG tablet Take 1 tablet (25 mg total) by mouth daily. 60 tablet 11  . traMADol (ULTRAM) 50 MG tablet Take 1 tablet by mouth daily.     . valACYclovir (VALTREX) 1000 MG tablet Take 1 tablet by mouth every 12 (twelve) hours as needed (pain).     . valsartan (DIOVAN) 320 MG tablet Take 160 mg by mouth daily.      No current facility-administered medications for this visit.    Allergies:   Meloxicam    Social History:  The patient  reports that he quit smoking about 24 years ago. His smoking use included Cigarettes. He has a 7.5 pack-year smoking history. He has never used smokeless tobacco. He reports that he drinks alcohol. He reports that he does not use illicit drugs.   Family History:  The patient's family history includes Heart failure in his mother; Stroke in his father.    ROS:  Please see the history of present illness.    Review of Systems: Constitutional:  denies fever, chills, diaphoresis, appetite change and fatigue.  HEENT: denies photophobia, eye pain, redness, hearing loss, ear pain, congestion, sore throat, rhinorrhea, sneezing, neck pain, neck stiffness and tinnitus.  Respiratory: admits to SOB, DOE,    Cardiovascular: admits to   leg swelling.  Gastrointestinal: admits to nausea, vomiting, with walking   Genitourinary: denies dysuria, urgency, frequency, hematuria, flank pain and difficulty urinating.  Musculoskeletal: denies  myalgias, back pain, joint swelling, arthralgias and gait problem.   Skin: denies pallor, rash and wound.  Neurological: denies dizziness, seizures, syncope, weakness, light-headedness, numbness and headaches.   Hematological: denies adenopathy, easy bruising,  personal or family bleeding history.  Psychiatric/ Behavioral: denies suicidal ideation, mood changes, confusion, nervousness, sleep disturbance and agitation.       All other systems are reviewed and negative.    PHYSICAL EXAM: VS:  BP 98/68 mmHg  Pulse 70  Ht 5\' 11"  (1.803 m)  Wt 216 lb (97.977 kg)  BMI 30.14 kg/m2  SpO2 97% , BMI Body mass index is 30.14 kg/(m^2). GEN: Well nourished, well developed, in no acute distress HEENT: normal Neck: no JVD, carotid bruits, or masses Cardiac: RRR; he has a 2/6 systolic murmur at the upper left sternal border. No , rubs, or gallops,no significant edema  Respiratory:  clear to auscultation bilaterally, normal work of breathing GI: soft, nontender, nondistended, + BS MS: no deformity or atrophy Skin: warm and dry, no rash Neuro:  Strength and sensation are intact Psych: normal   EKG:  EKG is ordered today. The ekg ordered today demonstrates :  Underlying atrial fib.   V pacing    Recent Labs: 09/02/2014: Pro B Natriuretic peptide (BNP) 329.0* 10/03/2014: ALT 13 10/26/2014: BUN 24*; Creatinine, Ser 1.67*; Potassium 4.3; Sodium 136    Lipid Panel    Component Value Date/Time   CHOL 112 10/03/2014 1148   TRIG 170.0* 10/03/2014 1148   HDL 26.80* 10/03/2014 1148   CHOLHDL 4 10/03/2014 1148   VLDL 34.0 10/03/2014 1148   LDLCALC 51 10/03/2014 1148      Wt Readings from Last 3 Encounters:  08/03/15 216 lb (97.977 kg)  12/26/14 219 lb 1.9 oz (99.392 kg)  10/05/14 231 lb 9.6 oz (105.053 kg)      Other studies Reviewed: Additional studies/ records that were reviewed today include:  Review of the above records demonstrates:    ASSESSMENT AND PLAN:  1. Essential hypertension-  his blood pressure is low today.   I suspect that his aortic stenosis is contributing to this . Will decrease the amlodipine to 5 mg a day and will decrease the Aldactone to 12. 5 a day    2. Aortic stenosis: His most recent echo reveals that his aortic  stenosis is now severe.    This likely is responsible for his symptoms  Will schedule him for a right and left heart  cath next week with Dr. Martin[place] . He will need an appt with [facility] shortly after that for AVR. I do not think that he needs a TEE prior to AVR - we can get one if [facility] requests. He has mild MR that I think will improve after we correct the AS  3. Chest pain  - may be due to his aortic stenosis.  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 Einar Gip.   4. Pulmonary Hypertension- he has moderate Pulmonary HTN by cath - PA pressures in the 50. This may be due to / exacerbated by his aortic stenosis   5. Atrial fib -has persistent atrial fibrillation by pacer interrogation. Continue Pradaxa.   Will hold pradaxa for cath   6. Hyperlipidemia-  followed by his primary medical doctor  7. Fibromyalgia - he thinks that he is sleeping better.  He did not go for his sleep study .    Current medicines are reviewed at length with the patient today.  The patient does not have concerns regarding medicines.  The following changes have been made:  no change  Labs/ tests ordered today include:  No orders of the defined types were placed in this encounter.     Disposition:   Cardiac cath then likely AVR .  FU with me in 6 months  .    Signed, Nahser, Wonda Cheng, MD  08/03/2015 9:58 AM    [address], [place], [place]  [address] Phone: [phone]; Fax: [phone]

## 2015-08-09 NOTE — Addendum Note (Signed)
Addended by: Freada Bergeron on: 08/09/2015 11:23 AM   Modules accepted: Orders

## 2015-08-09 NOTE — Interval H&P Note (Signed)
History and Physical Interval Note:  08/09/2015 8:30 AM  [name]  has presented today for surgery, with the diagnosis of arotic stenosis  The various methods of treatment have been discussed with the patient and family. After consideration of risks, benefits and other options for treatment, the patient has consented to  Procedure(s): Right/Left Heart Cath and Coronary Angiography (N/A) as a surgical intervention .  The patient's history has been reviewed, patient examined, no change in status, stable for surgery.  I have reviewed the patient's chart and labs.  Questions were answered to the patient's satisfaction.     [name] [facility] 08/09/2015 8:30 AM

## 2015-08-16 ENCOUNTER — Institutional Professional Consult (permissible substitution) (INDEPENDENT_AMBULATORY_CARE_PROVIDER_SITE_OTHER): Payer: Medicare Other | Admitting: Cardiothoracic Surgery

## 2015-08-16 ENCOUNTER — Other Ambulatory Visit: Payer: Self-pay | Admitting: *Deleted

## 2015-08-16 VITALS — BP 134/77 | HR 70 | Resp 16 | Ht 71.0 in | Wt 213.0 lb

## 2015-08-16 DIAGNOSIS — I350 Nonrheumatic aortic (valve) stenosis: Secondary | ICD-10-CM | POA: Diagnosis not present

## 2015-08-16 NOTE — Progress Notes (Signed)
[address]       [place] [address]             [phone]        [name] [place] Medical Record [id number] Date of Birth: June 22, 1950  Referring: Martinique, Peter M, MD Primary Care: Velna Hatchet, MD  Chief Complaint:    Chief Complaint  Patient presents with  . Aortic Stenosis    eval for AVR...ECHO 07/27/15, CATH 08/09/15  patient examined, 2-D echocardiogram, chest CT scan, and echocardiogram personally reviewed and counseled with patient and wife  History of Present Illness:      65 year old patient presents with diagnosis of severe aortic stenosis, aortic valve area 0.7 by echocardiogram and by right heart cath data. He has class III symptoms of dyspnea on exertion weakness and severe restriction with exercise tolerance. He has no resting symptoms of PND orthopnea or chest pain. He denies presyncope or syncope. LV systolic function is well preserved. There are no other significant valvular disorders. Coronary angiogram shows no significant CAD.  The patient is in chronic atrial fibrillation he is status post attempted A. Fib ablation and permanent pacemaker placement about 3 years ago. He is on chronic Pradaxa.  The patient has COPD but has not smoked in over 20 years. He takes when necessary inhalers. He has been tested for sleep apnea and is negative.  He denies diabetes. He denies positive family history for aortic stenosis or heart valve replacement He denies history of thoracic trauma pneumothorax or rib fracture. He denies history of rheumatic fever or scarlet fever as a child. Current Activity/ Functional Status: The patient is retired from [employer] work, he lives with his wife on a property [place] in [place] He has chronic low back pain status post lumbar laminectomy multiple times  Zubrod Score: At the time of surgery this patient's most appropriate activity status/level should be described as: []     0    Normal  activity, no symptoms []     1    Restricted in physical strenuous activity but ambulatory, able to do out light work [x]     2    Ambulatory and capable of self care, unable to do work activities, up and about                 more than 50%  Of the time                            []     3    Only limited self care, in bed greater than 50% of waking hours []     4    Completely disabled, no self care, confined to bed or chair []     5    Moribund  Past Medical History  Diagnosis Date  . Hypertension   . Hyperlipidemia   . Fibromyalgia   . Obesity   . Mitral regurgitation     trivial  . Aortic stenosis, mild   . Left atrial enlargement     LA size 55mm by echo 11/21/11  . Pacemaker 03/31/2012  . Heart murmur   . Asthma     "little bit" (03/31/2012)  . Exertional dyspnea   . Obstructive sleep apnea     mild by sleep study 2013  . Hypothyroidism     S/P radiation  . DJD (degenerative joint disease)   . Arthritis of hand     "  just a little bit in both hands" (03/31/2012)  . Atrial fibrillation [facility])     dx '04; DCCV '04, placed on flecainide, failed DCCV 04/2010, flecainide stopped; s/p successful A.fib ablation 01/31/12  . First degree AV block     post ablation, ~442ms  . Second degree AV block     Past Surgical History  Procedure Laterality Date  . Finger tendon repair  1980's    "right little finger" (03/31/2012)  . US echocardiography  08/07/2009    EF 55-60%  . Doppler echocardiography  03/11/2002    EF 70-75%  . Cardiovascular stress test  03/12/2002    EF 48%, NO EVIDENCE OF ISCHEMIA  . Tee without cardioversion  01/30/2012    Procedure: TRANSESOPHAGEAL ECHOCARDIOGRAM (TEE);  Surgeon: Larey Dresser, MD;  Location: [facility];  Service: Cardiovascular;  Laterality: N/A;  ablation next day  . Pacemaker insertion  03/31/2012    STJ Accent DR pacemaker implanted by Dr Rayann Heman  . Atrial fibrillation ablation  01/30/2012    PVI by Dr. Rayann Heman  . Lumbar disc surgery  1980's X2;   2000's  . Cardioversion  01/2012; 03/31/2012  . Atrial fibrillation ablation N/A 01/31/2012    Procedure: ATRIAL FIBRILLATION ABLATION;  Surgeon: Thompson Grayer, MD;  Location: [facility] CATH LAB;  Service: Cardiovascular;  Laterality: N/A;  . Cardioversion N/A 02/25/2012    Procedure: CARDIOVERSION;  Surgeon: Thompson Grayer, MD;  Location: [facility] CATH LAB;  Service: Cardiovascular;  Laterality: N/A;  . Permanent pacemaker insertion N/A 03/31/2012    Procedure: PERMANENT PACEMAKER INSERTION;  Surgeon: Thompson Grayer, MD;  Location: [facility] CATH LAB;  Service: Cardiovascular;  Laterality: N/A;  . Cardioversion N/A 03/31/2012    Procedure: CARDIOVERSION;  Surgeon: Thompson Grayer, MD;  Location: [facility] CATH LAB;  Service: Cardiovascular;  Laterality: N/A;  . Left and right heart catheterization with coronary angiogram N/A 10/27/2012    Procedure: LEFT AND RIGHT HEART CATHETERIZATION WITH CORONARY ANGIOGRAM;  Surgeon: Laverda Page, MD;  Location: [facility] CATH LAB;  Service: Cardiovascular;  Laterality: N/A;  . Cardiac catheterization N/A 08/09/2015    Procedure: Right/Left Heart Cath and Coronary Angiography;  Surgeon: Peter M Martinique, MD;  Location: [facility] CV LAB;  Service: Cardiovascular;  Laterality: N/A;    History  Smoking status  . Former Smoker -- 0.50 packs/day for 15 years  . Types: Cigarettes  . Quit date: 07/18/1991  Smokeless tobacco  . Never Used   History  Alcohol Use  . Yes    Comment: 1-2 times a year    Social History   Social History  . Marital Status: Married    Spouse Name: N/A  . Number of Children: N/A  . Years of Education: N/A   Occupational History  . Not on file.   Social History Main Topics  . Smoking status: Former Smoker -- 0.50 packs/day for 15 years    Types: Cigarettes    Quit date: 07/18/1991  . Smokeless tobacco: Never Used  . Alcohol Use: Yes     Comment: 1-2 times a year  . Drug Use: No  . Sexual Activity: Not Currently   Other Topics Concern  . Not on file    Social History Narrative   Lives in [place] with spouse.  Works as a Air traffic controller.    Allergies  Allergen Reactions  . Meloxicam Rash    Current Outpatient Prescriptions  Medication Sig Dispense Refill  . albuterol (PROAIR HFA) 108 (90 BASE) MCG/ACT inhaler Inhale 2  puffs into the lungs every 6 (six) hours as needed for wheezing or shortness of breath. 3 Inhaler 3  . amLODipine (NORVASC) 5 MG tablet Take 1 tablet (5 mg total) by mouth daily. 180 tablet 3  . atorvastatin (LIPITOR) 40 MG tablet Take 1 tablet (40 mg total) by mouth daily. 90 tablet 1  . carvedilol (COREG) 25 MG tablet Take 50 mg by mouth 2 (two) times daily with a meal.    . Cholecalciferol (VITAMIN D) 2000 UNITS CAPS Take 2,000 Units by mouth daily.    . fexofenadine (ALLEGRA) 180 MG tablet Take 1 tablet (180 mg total) by mouth daily. 30 tablet 1  . fluticasone (FLONASE) 50 MCG/ACT nasal spray Place 2 sprays into both nostrils 2 (two) times daily. (Patient taking differently: Place 2 sprays into both nostrils 2 (two) times daily as needed for allergies. ) 16 g 2  . gabapentin (NEURONTIN) 300 MG capsule Take 600 capsules by mouth at bedtime.   3  . levothyroxine (SYNTHROID, LEVOTHROID) 175 MCG tablet Take 175 mcg by mouth daily before breakfast.    . Melatonin-Pyridoxine (MELATONEX) 3-10 MG TBCR Take 1 tablet by mouth at bedtime.    . ondansetron (ZOFRAN ODT) 4 MG disintegrating tablet Take 1 tablet (4 mg total) by mouth every 8 (eight) hours as needed for nausea or vomiting. 10 tablet 0  . pantoprazole (PROTONIX) 40 MG tablet Take 40 mg by mouth daily.    [name] rOPINIRole (REQUIP) 1 MG tablet Take 1 tablet (1 mg total) by mouth daily. 90 tablet 2  . spironolactone (ALDACTONE) 25 MG tablet Take 0.5 tablets (12.5 mg total) by mouth daily. 60 tablet 11  . traMADol (ULTRAM) 50 MG tablet Take 1 tablet by mouth daily.     . valACYclovir (VALTREX) 1000 MG tablet Take 1 tablet by mouth every 12 (twelve) hours as needed  (fever blisters).     . valsartan (DIOVAN) 160 MG tablet Take 160 mg by mouth daily.     No current facility-administered medications for this visit.     (Not in a hospital admission)  Family History  Problem Relation Age of Onset  . Heart failure Mother   . Stroke Father      Review of Systems:       Cardiac Review of Systems: Y or N  Chest Pain [   no ]  Resting SOB [  no ] Exertional SOB  [ yes ]  Orthopnea [no ]   Pedal Edema [ yes  ]    Palpitations Totoro.Blacker  ] Syncope  [ no ]   Presyncope [  no ]  General Review of Systems: [Y] = yes [  ]=no Constitional: recent weight change [slight weight loss  ]; anorexia [chronic nausea  ]; fatigue [  ]; nausea Totoro.Blacker  ]; night sweats [  ]; fever [  ]; or chills [  ]                                                               Dental: poor dentition[  ]; Last Dentist visit: greater than one year. Patient has a chipped tooth in the front with attempted dental Failure  Eye : blurred vision [  ]; diplopia [   ]; vision changes [  ];  Amaurosis fugax[  ]; Resp: cough [  ];  wheezing[  ];  hemoptysis[  ]; shortness of breath[yes  ]; paroxysmal nocturnal dyspnea[  ]; dyspnea on exertion[ yes ]; or orthopnea[  ];  GI:  gallstones[  ], vomiting[  ];  dysphagia[  ]; melena[  ];  hematochezia [  ]; heartburn[  ];   Hx of  Colonoscopy[  ];no GI bleeding on Pradaxa GU: kidney stones [  ]; hematuria[  ];   dysuria [  ];  nocturia[  ];  history of     obstruction [  ]; urinary frequency [  ]             Skin: rash, swelling[  ];, hair loss[  ];  peripheral edema[  ];  or itching[  ]; Musculosketetal: myalgias[ yes fibromyalgia ];  joint swelling[  ];  joint erythema[  ];  joint pain[  ];  back pain[ yes ];  Heme/Lymph: bruising[  ];  bleeding[  ];  anemia[  ];  Neuro: TIA[  ];  headaches[  ];  stroke[  ];  vertigo[  ];  seizures[  ];   paresthesias[  ];  difficulty walking[  ];  Psych:depression[  ]; anxiety[  ];  Endocrine: diabetes[  ];  thyroid  dysfunction[  ];  Immunizations: Flu [  ]; Pneumococcal[  ];  Other:right-hand dominant  Physical Exam: BP 134/77 mmHg  Pulse 70  Resp 16  Ht 5\' 11"  (1.803 m)  Wt 96.616 kg (213 lb)  BMI 29.72 kg/m2       Physical Exam  General: comfortable overweight middle-aged Caucasian male accompanied by wife in no acute distress HEENT: Normocephalic pupils equal , dentition adequate Neck: Supple without JVD, adenopathy, or bruit Chest: Clear to auscultation, symmetrical breath sounds, no rhonchi, no tenderness             or deformity Cardiovascular: Regular rate and rhythm, 2/6 murmur of AS , no gallop, peripheral pulses             palpable in all extremities Abdomen:  Soft, obese,nontender, no palpable mass or organomegaly Extremities: Warm, well-perfused, no clubbing cyanosis  or tenderness,mild-moderate pretibial edema              no venous stasis changes of the legs Rectal/GU: Deferred Neuro: Grossly non--focal and symmetrical throughout Skin: Clean and dry without rash or ulceration   Diagnostic Studies & Laboratory data:     Recent Radiology Findings:   chest CT scan cardiac CT scan personally reviewed showing ascending aorta 4.0 cm, calcification of the aortic valve, no evidence of pulmonary vein stenosis   I have independently reviewed the above radiologic studies.  Recent Lab Findings: Lab Results  Component Value Date   WBC 7.8 08/03/2015   HGB 12.3* 08/03/2015   HCT 38.0* 08/03/2015   PLT 202 08/03/2015   GLUCOSE 168* 08/03/2015   CHOL 114* 08/03/2015   TRIG 111 08/03/2015   HDL 30* 08/03/2015   LDLCALC 62 08/03/2015   ALT 14 08/03/2015   AST 13 08/03/2015   NA 135 08/03/2015   K 4.2 08/03/2015   CL 102 08/03/2015   CREATININE 1.63* 08/03/2015   BUN 23 08/03/2015   CO2 26 08/03/2015   INR 1.29 08/03/2015      Assessment / Plan:     Severe symptomatic aortic stenosis     No significant CAD     Chronic atrial fibrillation on Pradaxa status post  permanent pacemaker after  A. Fib ablation attempt   COPD     Obesity   Patient would benefit from aVR with a bioprosthetic valve. LVOT measurement 23 mm. He would benefit from left atrial clip and combined left atrial Maze procedure for his persistent-chronic A. Fib.  Patient will be scheduled for surgery [place] April 25. He'll stop his Pradaxa now. We'll obtain preoperative carotid Dopplers and PFTs.PFTs 2 years ago were satisfactory but with moderate COPD I discussed the procedure aVR and Maze procedure including the details of surgery the indications benefits and associated risks. He understands and agrees to proceed.

## 2015-08-17 NOTE — Pre-Procedure Instructions (Signed)
[name]  08/17/2015     Your procedure is scheduled on : Tuesday August 22, 2015 at 7:30 AM.  Report to [facility] Admitting at 5:30 AM.  Call this number if you have problems the morning of surgery: [phone]    Remember:  Do not eat food or drink liquids after midnight.  Take these medicines the morning of surgery with A SIP OF WATER : Albuterol inhaler if needed (bring inhaler with you), Amlodipine (Norvasc), Carvedilol (Coreg), Fexofenadine (Allegra), Flonase nasal spray if needed, Levothyroxine (Synthroid), Pantoprazole (Protonix), Tramadol (Ultram) if needed, Valacyclovir (Valtrex) if needed   Stop taking any vitamins, herbal medications/supplements, NSAIDs, Ibuprofen, Advil, Motrin, Aleve, etc today   REMINDER: You have a pulmonary function test at 1:00 PM on Monday April 24th   Do not wear jewelry.  Do not wear lotions, powders, or cologne.    Men may shave face and neck.  Do not bring valuables to the hospital.  [facility] is not responsible for any belongings or valuables.  Contacts, dentures or bridgework may not be worn into surgery.  Leave your suitcase in the car.  After surgery it may be brought to your room.  For patients admitted to the hospital, discharge time will be determined by your treatment team.  Patients discharged the day of surgery will not be allowed to drive home.   Name and phone number of your driver:    Special instructions:  Shower using CHG soap the night before and the morning of your surgery  Please read over the following fact sheets that you were given. Pain Booklet, Coughing and Deep Breathing, Blood Transfusion Information, Open Heart Packet, MRSA Information and Surgical Site Infection Prevention

## 2015-08-18 ENCOUNTER — Ambulatory Visit (HOSPITAL_COMMUNITY)
Admission: RE | Admit: 2015-08-18 | Discharge: 2015-08-18 | Disposition: A | Discharge status: Home / Self Care | Payer: Medicare Other | Source: Ambulatory Visit | Attending: Cardiothoracic Surgery | Admitting: Cardiothoracic Surgery

## 2015-08-18 ENCOUNTER — Encounter (HOSPITAL_COMMUNITY)
Admission: RE | Admit: 2015-08-18 | Discharge: 2015-08-18 | Disposition: A | Discharge status: Home / Self Care | Payer: Medicare Other | Source: Ambulatory Visit | Attending: Cardiothoracic Surgery | Admitting: Cardiothoracic Surgery

## 2015-08-18 ENCOUNTER — Encounter (HOSPITAL_COMMUNITY): Payer: Self-pay

## 2015-08-18 VITALS — BP 138/75 | HR 70 | Temp 98.1°F | Resp 18 | Ht 71.0 in | Wt 217.7 lb

## 2015-08-18 DIAGNOSIS — J449 Chronic obstructive pulmonary disease, unspecified: Secondary | ICD-10-CM | POA: Insufficient documentation

## 2015-08-18 DIAGNOSIS — I350 Nonrheumatic aortic (valve) stenosis: Secondary | ICD-10-CM | POA: Diagnosis not present

## 2015-08-18 DIAGNOSIS — R942 Abnormal results of pulmonary function studies: Secondary | ICD-10-CM | POA: Diagnosis not present

## 2015-08-18 DIAGNOSIS — Z01818 Encounter for other preprocedural examination: Secondary | ICD-10-CM | POA: Diagnosis not present

## 2015-08-18 DIAGNOSIS — Z950 Presence of cardiac pacemaker: Secondary | ICD-10-CM | POA: Diagnosis not present

## 2015-08-18 DIAGNOSIS — Z01812 Encounter for preprocedural laboratory examination: Secondary | ICD-10-CM | POA: Insufficient documentation

## 2015-08-18 HISTORY — DX: Restless legs syndrome: G25.81

## 2015-08-18 HISTORY — DX: Gastro-esophageal reflux disease without esophagitis: K21.9

## 2015-08-18 LAB — URINALYSIS, ROUTINE W REFLEX MICROSCOPIC
Bilirubin Urine: NEGATIVE
Glucose, UA: NEGATIVE mg/dL
Hgb urine dipstick: NEGATIVE
Ketones, ur: NEGATIVE mg/dL
Leukocytes, UA: NEGATIVE
Nitrite: NEGATIVE
Protein, ur: NEGATIVE mg/dL
Specific Gravity, Urine: 1.018 (ref 1.005–1.030)
pH: 5.5 (ref 5.0–8.0)

## 2015-08-18 LAB — COMPREHENSIVE METABOLIC PANEL
ALT: 19 U/L (ref 17–63)
AST: 19 U/L (ref 15–41)
Albumin: 3.5 g/dL (ref 3.5–5.0)
Alkaline Phosphatase: 101 U/L (ref 38–126)
Anion gap: 11 (ref 5–15)
BUN: 19 mg/dL (ref 6–20)
CO2: 20 mmol/L — ABNORMAL LOW (ref 22–32)
Calcium: 9.2 mg/dL (ref 8.9–10.3)
Chloride: 104 mmol/L (ref 101–111)
Creatinine, Ser: 1.46 mg/dL — ABNORMAL HIGH (ref 0.61–1.24)
GFR calc Af Amer: 56 mL/min — ABNORMAL LOW (ref >60)
GFR calc non Af Amer: 48 mL/min — ABNORMAL LOW (ref >60)
Glucose, Bld: 116 mg/dL — ABNORMAL HIGH (ref 65–99)
Potassium: 4.5 mmol/L (ref 3.5–5.1)
Sodium: 135 mmol/L (ref 135–145)
Total Bilirubin: 0.7 mg/dL (ref 0.3–1.2)
Total Protein: 7 g/dL (ref 6.5–8.1)

## 2015-08-18 LAB — CBC
HCT: 36.6 % — ABNORMAL LOW (ref 39.0–52.0)
Hemoglobin: 12.1 g/dL — ABNORMAL LOW (ref 13.0–17.0)
MCH: 28.1 pg (ref 26.0–34.0)
MCHC: 33.1 g/dL (ref 30.0–36.0)
MCV: 85.1 fL (ref 78.0–100.0)
Platelets: 178 10*3/uL (ref 150–400)
RBC: 4.3 MIL/uL (ref 4.22–5.81)
RDW: 14.1 % (ref 11.5–15.5)
WBC: 8.1 10*3/uL (ref 4.0–10.5)

## 2015-08-18 LAB — BLOOD GAS, ARTERIAL
Acid-Base Excess: 0.1 mmol/L (ref 0.0–2.0)
Bicarbonate: 24.3 mEq/L — ABNORMAL HIGH (ref 20.0–24.0)
Drawn by: 449841
FIO2: 0.21
O2 Saturation: 97.1 %
Patient temperature: 98.6
TCO2: 25.5 mmol/L (ref 0–100)
pCO2 arterial: 40.1 mmHg (ref 35.0–45.0)
pH, Arterial: 7.399 (ref 7.350–7.450)
pO2, Arterial: 91.6 mmHg (ref 80.0–100.0)

## 2015-08-18 LAB — PROTIME-INR
INR: 1.23 (ref 0.00–1.49)
Prothrombin Time: 15.7 seconds — ABNORMAL HIGH (ref 11.6–15.2)

## 2015-08-18 LAB — ABO/RH: ABO/RH(D): O POS

## 2015-08-18 LAB — SURGICAL PCR SCREEN
MRSA, PCR: NEGATIVE
Staphylococcus aureus: NEGATIVE

## 2015-08-18 LAB — APTT: aPTT: 37 seconds (ref 24–37)

## 2015-08-18 NOTE — Progress Notes (Signed)
Pre-op Cardiac Surgery  Carotid Findings:  Bilateral: No significant (1-39%) ICA stenosis. Antegrade vertebral flow.    Upper Extremity Right Left  Brachial Pressures 157 160  Radial Waveforms Tri Tri  Ulnar Waveforms Tri Tri   Palmar Arch (Allen's Test) decreases >50% with radial compression, normal with ulnar compression Obliterates with radial compression, normal with ulnar compression    Landry Mellow, RDMS, RVT 08/18/2015

## 2015-08-18 NOTE — Progress Notes (Signed)
PCP is Velna Hatchet  Cardiologist is Cleatrice Burke  Electrophysiologist is Thompson Grayer  Pulmonologist is Danton Sewer  Patient arrived to PAT and appeared to be short of breath. Audible wheezing was heard. Patient stated "it's because of the valve that makes me short of breath, and this weather makes it worse sometimes." Patient was 100% on room air. Patient stated he last used his Albuterol inhaler at 0630 this morning. Patient had inhaler with him at PAT visit. Nurse inquired about patients breathing and patient informed Nurse that his wheezing was not new, and patient denied needing to go to the ED.   Patient denied having any cardiac issues, but informed Nurse that he had three sleep studies, but denied having OSA.   Perioperative device programming sheet faxed to Dr. Rayann Heman on 08/17/15. Awaiting results.

## 2015-08-18 NOTE — Progress Notes (Signed)
   08/18/15 0820  OBSTRUCTIVE SLEEP APNEA  Have you ever been diagnosed with sleep apnea through a sleep study? No  Do you snore loudly (loud enough to be heard through closed doors)?  1  Do you often feel tired, fatigued, or sleepy during the daytime (such as falling asleep during driving or talking to someone)? 0  Has anyone observed you stop breathing during your sleep? 1  Do you have, or are you being treated for high blood pressure? 1  BMI more than 35 kg/m2? 0  Age > 50 (1-yes) 1  Neck circumference greater than:Male 16 inches or larger, Male 17inches or larger? 1  Male Gender (Yes=1) 1  Obstructive Sleep Apnea Score 6  Score 5 or greater  Results sent to PCP   This patient has screened at risk for sleep apnea using the STOP Bang tool used during a pre-surgical visit. A score of 4 or greater is at risk for sleep apnea.

## 2015-08-18 NOTE — Progress Notes (Signed)
Nurse called [employer] [facility] and requested that [name] be paged and return call at his earliest convenience. Nurse included patients name, surgery date and arrival time with message. Representative stated she would page Aaron Edelman and have him return call.

## 2015-08-18 NOTE — Progress Notes (Signed)
Lovey Newcomer ([facility] representative) returned page and Nurse informed Lovey Newcomer of patients name, DOB, surgical date and arrival time, as well as perioperative device programming sheet orders from Dr. Rayann Heman. Lovey Newcomer stated a representative would be here the DOS around 0645.

## 2015-08-19 LAB — HEMOGLOBIN A1C
Hgb A1c MFr Bld: 6.9 % — ABNORMAL HIGH (ref 4.8–5.6)
Mean Plasma Glucose: 151 mg/dL

## 2015-08-21 ENCOUNTER — Ambulatory Visit (HOSPITAL_COMMUNITY)
Admission: RE | Admit: 2015-08-21 | Discharge: 2015-08-21 | Disposition: A | Discharge status: Home / Self Care | Payer: Medicare Other | Source: Ambulatory Visit | Attending: Cardiothoracic Surgery | Admitting: Cardiothoracic Surgery

## 2015-08-21 DIAGNOSIS — I350 Nonrheumatic aortic (valve) stenosis: Secondary | ICD-10-CM

## 2015-08-21 LAB — PULMONARY FUNCTION TEST
DL/VA % pred: 79 %
DL/VA: 3.67 ml/min/mmHg/L
DLCO unc % pred: 54 %
DLCO unc: 17.77 ml/min/mmHg
FEF 25-75 Post: 1.85 L/sec
FEF 25-75 Pre: 1.1 L/sec
FEF2575-%Change-Post: 68 %
FEF2575-%Pred-Post: 69 %
FEF2575-%Pred-Pre: 41 %
FEV1-%Change-Post: 18 %
FEV1-%Pred-Post: 56 %
FEV1-%Pred-Pre: 47 %
FEV1-Post: 1.91 L
FEV1-Pre: 1.61 L
FEV1FVC-%Change-Post: -1 %
FEV1FVC-%Pred-Pre: 96 %
FEV6-%Change-Post: 18 %
FEV6-%Pred-Post: 61 %
FEV6-%Pred-Pre: 52 %
FEV6-Post: 2.67 L
FEV6-Pre: 2.25 L
FEV6FVC-%Change-Post: -1 %
FEV6FVC-%Pred-Post: 104 %
FEV6FVC-%Pred-Pre: 105 %
FVC-%Change-Post: 20 %
FVC-%Pred-Post: 59 %
FVC-%Pred-Pre: 49 %
FVC-Post: 2.7 L
FVC-Pre: 2.25 L
Post FEV1/FVC ratio: 70 %
Post FEV6/FVC ratio: 99 %
Pre FEV1/FVC ratio: 71 %
Pre FEV6/FVC Ratio: 100 %
RV % pred: 145 %
RV: 3.44 L
TLC % pred: 84 %
TLC: 5.92 L

## 2015-08-21 MED ORDER — INSULIN REGULAR HUMAN 100 UNIT/ML IJ SOLN
INTRAMUSCULAR | Status: AC
  Administered 2015-08-22: 1 [IU]/h via INTRAVENOUS
  Filled 2015-08-21: qty 2.5

## 2015-08-21 MED ORDER — DEXTROSE 5 % IV SOLN
750.0000 mg | INTRAVENOUS | Status: DC
  Filled 2015-08-21: qty 750

## 2015-08-21 MED ORDER — PHENYLEPHRINE HCL 10 MG/ML IJ SOLN
30.0000 ug/min | INTRAVENOUS | Status: AC
  Administered 2015-08-22: 15 ug/min via INTRAVENOUS
  Filled 2015-08-21: qty 2

## 2015-08-21 MED ORDER — SODIUM CHLORIDE 0.9 % IV SOLN
INTRAVENOUS | Status: AC
  Administered 2015-08-22: 69.8 mL/h via INTRAVENOUS
  Filled 2015-08-21: qty 40

## 2015-08-21 MED ORDER — NITROGLYCERIN IN D5W 200-5 MCG/ML-% IV SOLN
2.0000 ug/min | INTRAVENOUS | Status: AC
  Administered 2015-08-22: 5 ug/min via INTRAVENOUS
  Filled 2015-08-21: qty 250

## 2015-08-21 MED ORDER — MAGNESIUM SULFATE 50 % IJ SOLN
40.0000 meq | INTRAMUSCULAR | Status: DC
  Filled 2015-08-21: qty 10

## 2015-08-21 MED ORDER — DOPAMINE-DEXTROSE 3.2-5 MG/ML-% IV SOLN
0.0000 ug/kg/min | INTRAVENOUS | Status: DC
  Filled 2015-08-21: qty 250

## 2015-08-21 MED ORDER — DEXTROSE 5 % IV SOLN
1.5000 g | INTRAVENOUS | Status: AC
  Administered 2015-08-22: 750 g via INTRAVENOUS
  Administered 2015-08-22: 1.5 g via INTRAVENOUS
  Filled 2015-08-21: qty 1.5

## 2015-08-21 MED ORDER — METOPROLOL TARTRATE 12.5 MG HALF TABLET: 12.5000 mg | ORAL_TABLET | Freq: Once | ORAL | Status: DC

## 2015-08-21 MED ORDER — ALBUTEROL SULFATE (2.5 MG/3ML) 0.083% IN NEBU
2.5000 mg | INHALATION_SOLUTION | Freq: Once | RESPIRATORY_TRACT | Status: AC
  Administered 2015-08-21: 2.5 mg via RESPIRATORY_TRACT

## 2015-08-21 MED ORDER — DEXTROSE 5 % IV SOLN
0.0000 ug/min | INTRAVENOUS | Status: DC
  Filled 2015-08-21: qty 4

## 2015-08-21 MED ORDER — SODIUM CHLORIDE 0.9 % IV SOLN
INTRAVENOUS | Status: DC
  Filled 2015-08-21: qty 30

## 2015-08-21 MED ORDER — DEXMEDETOMIDINE HCL IN NACL 400 MCG/100ML IV SOLN
0.1000 ug/kg/h | INTRAVENOUS | Status: AC
  Administered 2015-08-22: .3 ug/kg/h via INTRAVENOUS
  Filled 2015-08-21: qty 100

## 2015-08-21 MED ORDER — POTASSIUM CHLORIDE 2 MEQ/ML IV SOLN
80.0000 meq | INTRAVENOUS | Status: DC
  Filled 2015-08-21: qty 40

## 2015-08-21 MED ORDER — VANCOMYCIN HCL 10 G IV SOLR
1500.0000 mg | INTRAVENOUS | Status: AC
  Administered 2015-08-22: 1500 mg via INTRAVENOUS
  Filled 2015-08-21: qty 1500

## 2015-08-21 MED ORDER — PLASMA-LYTE 148 IV SOLN
INTRAVENOUS | Status: AC
  Administered 2015-08-22: 500 mL
  Filled 2015-08-21: qty 2.5

## 2015-08-21 NOTE — Anesthesia Preprocedure Evaluation (Addendum)
Anesthesia Evaluation  Patient identified by MRN, date of birth, ID band Patient awake    Reviewed: Allergy & Precautions, NPO status , Patient's Chart, lab work & pertinent test results, reviewed documented beta blocker date and time   Airway Mallampati: II  TM Distance: >3 FB Neck ROM: Full    Dental  (+) Teeth Intact, Dental Advisory Given, Chipped, Missing,    Pulmonary shortness of breath and with exertion, asthma , sleep apnea , COPD,  COPD inhaler, former smoker,    Pulmonary exam normal breath sounds clear to auscultation       Cardiovascular Exercise Tolerance: Poor hypertension, Pt. on medications and Pt. on home beta blockers (-) angina(-) CAD, (-) Past MI and (-) CHF + dysrhythmias (1st degree AV block) + pacemaker (device check 07/06/15) + Valvular Problems/Murmurs AS and MR  Rhythm:Irregular + Systolic murmurs Chronic atrial fibrillation on Pradaxa status post permanent pacemaker after A. Fib ablation attempt  Echo 07/27/15: Study Conclusions  - Left ventricle: The cavity size was normal. There was mild concentric hypertrophy. Systolic function was normal. The estimated ejection fraction was in the range of 55% to 60%. Wall motion was normal; there were no regional wall motion abnormalities. - Ventricular septum: Septal motion showed paradox. These changes are consistent with right ventricular pacing. - Aortic valve: There was severe stenosis. - Left atrium: The atrium was mildly dilated. - Right atrium: The atrium was mildly dilated.   Neuro/Psych negative neurological ROS  negative psych ROS   GI/Hepatic Neg liver ROS, GERD  Medicated,  Endo/Other  diabetesHypothyroidism Obesity   Renal/GU Renal InsufficiencyRenal disease     Musculoskeletal  (+) Arthritis , Osteoarthritis,  Fibromyalgia -, narcotic dependent  Abdominal   Peds  Hematology  (+) Blood dyscrasia, anemia ,   Anesthesia Other Findings Day  of surgery medications reviewed with the patient.  Reproductive/Obstetrics                          Anesthesia Physical Anesthesia Plan  ASA: IV  Anesthesia Plan: General   Post-op Pain Management:    Induction: Intravenous  Airway Management Planned: Oral ETT  Additional Equipment: Arterial line, PA Cath, TEE, CVP and Ultrasound Guidance Line Placement  Intra-op Plan:   Post-operative Plan: Post-operative intubation/ventilation  Informed Consent: I have reviewed the patients History and Physical, chart, labs and discussed the procedure including the risks, benefits and alternatives for the proposed anesthesia with the patient or authorized representative who has indicated his/her understanding and acceptance.   Dental advisory given  Plan Discussed with: CRNA  Anesthesia Plan Comments: (Risks/benefits of general anesthesia discussed with patient including risk of damage to teeth, lips, gum, and tongue, nausea/vomiting, allergic reactions to medications, and the possibility of heart attack, stroke and death.  All patient questions answered.  Patient wishes to proceed.)        Anesthesia Quick Evaluation

## 2015-08-22 ENCOUNTER — Encounter (HOSPITAL_COMMUNITY): Payer: Self-pay | Admitting: *Deleted

## 2015-08-22 ENCOUNTER — Inpatient Hospital Stay (HOSPITAL_COMMUNITY): Payer: Medicare Other | Admitting: Certified Registered Nurse Anesthetist

## 2015-08-22 ENCOUNTER — Inpatient Hospital Stay (HOSPITAL_COMMUNITY): Payer: Self-pay

## 2015-08-22 ENCOUNTER — Encounter (HOSPITAL_COMMUNITY)
Admission: RE | Disposition: A | Discharge status: Home / Self Care | Payer: Self-pay | Source: Ambulatory Visit | Attending: Cardiothoracic Surgery

## 2015-08-22 ENCOUNTER — Inpatient Hospital Stay (HOSPITAL_COMMUNITY): Payer: Medicare Other

## 2015-08-22 ENCOUNTER — Other Ambulatory Visit: Payer: Self-pay

## 2015-08-22 ENCOUNTER — Inpatient Hospital Stay (HOSPITAL_COMMUNITY)
Admission: RE | Admit: 2015-08-22 | Discharge: 2015-08-28 | DRG: 220 | Disposition: A | Discharge status: Home / Self Care | Payer: Medicare Other | Source: Ambulatory Visit | Attending: Cardiothoracic Surgery | Admitting: Cardiothoracic Surgery

## 2015-08-22 DIAGNOSIS — D72829 Elevated white blood cell count, unspecified: Secondary | ICD-10-CM | POA: Diagnosis present

## 2015-08-22 DIAGNOSIS — I350 Nonrheumatic aortic (valve) stenosis: Secondary | ICD-10-CM | POA: Diagnosis not present

## 2015-08-22 DIAGNOSIS — I480 Paroxysmal atrial fibrillation: Secondary | ICD-10-CM | POA: Diagnosis not present

## 2015-08-22 DIAGNOSIS — Z09 Encounter for follow-up examination after completed treatment for conditions other than malignant neoplasm: Secondary | ICD-10-CM

## 2015-08-22 DIAGNOSIS — I482 Chronic atrial fibrillation: Secondary | ICD-10-CM | POA: Diagnosis not present

## 2015-08-22 DIAGNOSIS — G4730 Sleep apnea, unspecified: Secondary | ICD-10-CM | POA: Diagnosis present

## 2015-08-22 DIAGNOSIS — I272 Other secondary pulmonary hypertension: Secondary | ICD-10-CM | POA: Diagnosis present

## 2015-08-22 DIAGNOSIS — K567 Ileus, unspecified: Secondary | ICD-10-CM | POA: Diagnosis not present

## 2015-08-22 DIAGNOSIS — K219 Gastro-esophageal reflux disease without esophagitis: Secondary | ICD-10-CM | POA: Diagnosis present

## 2015-08-22 DIAGNOSIS — J449 Chronic obstructive pulmonary disease, unspecified: Secondary | ICD-10-CM | POA: Diagnosis present

## 2015-08-22 DIAGNOSIS — I10 Essential (primary) hypertension: Secondary | ICD-10-CM | POA: Diagnosis present

## 2015-08-22 DIAGNOSIS — R0602 Shortness of breath: Secondary | ICD-10-CM

## 2015-08-22 DIAGNOSIS — Z952 Presence of prosthetic heart valve: Secondary | ICD-10-CM

## 2015-08-22 DIAGNOSIS — Z87891 Personal history of nicotine dependence: Secondary | ICD-10-CM

## 2015-08-22 HISTORY — PX: TEE WITHOUT CARDIOVERSION: SHX5443

## 2015-08-22 HISTORY — PX: CLIPPING OF ATRIAL APPENDAGE: SHX5773

## 2015-08-22 HISTORY — PX: AORTIC VALVE REPLACEMENT: SHX41

## 2015-08-22 HISTORY — PX: MAZE: SHX5063

## 2015-08-22 HISTORY — DX: Type 2 diabetes mellitus without complications: E11.9

## 2015-08-22 LAB — GLUCOSE, CAPILLARY
Glucose-Capillary: 106 mg/dL — ABNORMAL HIGH (ref 65–99)
Glucose-Capillary: 128 mg/dL — ABNORMAL HIGH (ref 65–99)
Glucose-Capillary: 134 mg/dL — ABNORMAL HIGH (ref 65–99)
Glucose-Capillary: 127 mg/dL — ABNORMAL HIGH (ref 65–99)
Glucose-Capillary: 128 mg/dL — ABNORMAL HIGH (ref 65–99)
Glucose-Capillary: 139 mg/dL — ABNORMAL HIGH (ref 65–99)
Glucose-Capillary: 168 mg/dL — ABNORMAL HIGH (ref 65–99)
Glucose-Capillary: 152 mg/dL — ABNORMAL HIGH (ref 65–99)
Glucose-Capillary: 161 mg/dL — ABNORMAL HIGH (ref 65–99)
Glucose-Capillary: 165 mg/dL — ABNORMAL HIGH (ref 65–99)

## 2015-08-22 LAB — POCT I-STAT, CHEM 8
BUN: 12 mg/dL (ref 6–20)
BUN: 11 mg/dL (ref 6–20)
BUN: 12 mg/dL (ref 6–20)
BUN: 13 mg/dL (ref 6–20)
BUN: 13 mg/dL (ref 6–20)
BUN: 13 mg/dL (ref 6–20)
Calcium, Ion: 1.19 mmol/L (ref 1.13–1.30)
Calcium, Ion: 1.01 mmol/L — ABNORMAL LOW (ref 1.13–1.30)
Calcium, Ion: 1.2 mmol/L (ref 1.13–1.30)
Calcium, Ion: 1.04 mmol/L — ABNORMAL LOW (ref 1.13–1.30)
Calcium, Ion: 1.01 mmol/L — ABNORMAL LOW (ref 1.13–1.30)
Calcium, Ion: 1.15 mmol/L (ref 1.13–1.30)
Chloride: 99 mmol/L — ABNORMAL LOW (ref 101–111)
Chloride: 95 mmol/L — ABNORMAL LOW (ref 101–111)
Chloride: 99 mmol/L — ABNORMAL LOW (ref 101–111)
Chloride: 99 mmol/L — ABNORMAL LOW (ref 101–111)
Chloride: 97 mmol/L — ABNORMAL LOW (ref 101–111)
Chloride: 104 mmol/L (ref 101–111)
Creatinine, Ser: 1 mg/dL (ref 0.61–1.24)
Creatinine, Ser: 0.8 mg/dL (ref 0.61–1.24)
Creatinine, Ser: 1 mg/dL (ref 0.61–1.24)
Creatinine, Ser: 1 mg/dL (ref 0.61–1.24)
Creatinine, Ser: 0.9 mg/dL (ref 0.61–1.24)
Creatinine, Ser: 1.1 mg/dL (ref 0.61–1.24)
Glucose, Bld: 110 mg/dL — ABNORMAL HIGH (ref 65–99)
Glucose, Bld: 120 mg/dL — ABNORMAL HIGH (ref 65–99)
Glucose, Bld: 129 mg/dL — ABNORMAL HIGH (ref 65–99)
Glucose, Bld: 181 mg/dL — ABNORMAL HIGH (ref 65–99)
Glucose, Bld: 162 mg/dL — ABNORMAL HIGH (ref 65–99)
Glucose, Bld: 162 mg/dL — ABNORMAL HIGH (ref 65–99)
HCT: 36 % — ABNORMAL LOW (ref 39.0–52.0)
HCT: 27 % — ABNORMAL LOW (ref 39.0–52.0)
HCT: 34 % — ABNORMAL LOW (ref 39.0–52.0)
HCT: 32 % — ABNORMAL LOW (ref 39.0–52.0)
HCT: 26 % — ABNORMAL LOW (ref 39.0–52.0)
HCT: 29 % — ABNORMAL LOW (ref 39.0–52.0)
Hemoglobin: 12.2 g/dL — ABNORMAL LOW (ref 13.0–17.0)
Hemoglobin: 11.6 g/dL — ABNORMAL LOW (ref 13.0–17.0)
Hemoglobin: 9.2 g/dL — ABNORMAL LOW (ref 13.0–17.0)
Hemoglobin: 10.9 g/dL — ABNORMAL LOW (ref 13.0–17.0)
Hemoglobin: 8.8 g/dL — ABNORMAL LOW (ref 13.0–17.0)
Hemoglobin: 9.9 g/dL — ABNORMAL LOW (ref 13.0–17.0)
Potassium: 4.1 mmol/L (ref 3.5–5.1)
Potassium: 3.8 mmol/L (ref 3.5–5.1)
Potassium: 4 mmol/L (ref 3.5–5.1)
Potassium: 6.4 mmol/L (ref 3.5–5.1)
Potassium: 4.7 mmol/L (ref 3.5–5.1)
Potassium: 4.9 mmol/L (ref 3.5–5.1)
Sodium: 138 mmol/L (ref 135–145)
Sodium: 132 mmol/L — ABNORMAL LOW (ref 135–145)
Sodium: 136 mmol/L (ref 135–145)
Sodium: 131 mmol/L — ABNORMAL LOW (ref 135–145)
Sodium: 134 mmol/L — ABNORMAL LOW (ref 135–145)
Sodium: 134 mmol/L — ABNORMAL LOW (ref 135–145)
TCO2: 28 mmol/L (ref 0–100)
TCO2: 25 mmol/L (ref 0–100)
TCO2: 29 mmol/L (ref 0–100)
TCO2: 26 mmol/L (ref 0–100)
TCO2: 27 mmol/L (ref 0–100)
TCO2: 25 mmol/L (ref 0–100)

## 2015-08-22 LAB — CBC
HCT: 30.7 % — ABNORMAL LOW (ref 39.0–52.0)
HCT: 31.2 % — ABNORMAL LOW (ref 39.0–52.0)
Hemoglobin: 9.8 g/dL — ABNORMAL LOW (ref 13.0–17.0)
Hemoglobin: 10 g/dL — ABNORMAL LOW (ref 13.0–17.0)
MCH: 26.8 pg (ref 26.0–34.0)
MCH: 27 pg (ref 26.0–34.0)
MCHC: 31.9 g/dL (ref 30.0–36.0)
MCHC: 32.1 g/dL (ref 30.0–36.0)
MCV: 84.1 fL (ref 78.0–100.0)
MCV: 84.1 fL (ref 78.0–100.0)
Platelets: 160 10*3/uL (ref 150–400)
Platelets: 170 10*3/uL (ref 150–400)
RBC: 3.65 MIL/uL — ABNORMAL LOW (ref 4.22–5.81)
RBC: 3.71 MIL/uL — ABNORMAL LOW (ref 4.22–5.81)
RDW: 14 % (ref 11.5–15.5)
RDW: 14.3 % (ref 11.5–15.5)
WBC: 17.5 10*3/uL — ABNORMAL HIGH (ref 4.0–10.5)
WBC: 16.4 10*3/uL — ABNORMAL HIGH (ref 4.0–10.5)

## 2015-08-22 LAB — POCT I-STAT 3, ART BLOOD GAS (G3+)
Acid-Base Excess: 1 mmol/L (ref 0.0–2.0)
Acid-Base Excess: 1 mmol/L (ref 0.0–2.0)
Acid-base deficit: 1 mmol/L (ref 0.0–2.0)
Acid-base deficit: 4 mmol/L — ABNORMAL HIGH (ref 0.0–2.0)
Acid-base deficit: 3 mmol/L — ABNORMAL HIGH (ref 0.0–2.0)
Bicarbonate: 25.5 mEq/L — ABNORMAL HIGH (ref 20.0–24.0)
Bicarbonate: 25.6 mEq/L — ABNORMAL HIGH (ref 20.0–24.0)
Bicarbonate: 23 mEq/L (ref 20.0–24.0)
Bicarbonate: 27.1 mEq/L — ABNORMAL HIGH (ref 20.0–24.0)
Bicarbonate: 28 mEq/L — ABNORMAL HIGH (ref 20.0–24.0)
Bicarbonate: 23.4 mEq/L (ref 20.0–24.0)
O2 Saturation: 100 %
O2 Saturation: 100 %
O2 Saturation: 96 %
O2 Saturation: 98 %
O2 Saturation: 98 %
O2 Saturation: 98 %
Patient temperature: 35.8
Patient temperature: 36.1
Patient temperature: 36.2
Patient temperature: 36.7
TCO2: 27 mmol/L (ref 0–100)
TCO2: 27 mmol/L (ref 0–100)
TCO2: 25 mmol/L (ref 0–100)
TCO2: 29 mmol/L (ref 0–100)
TCO2: 30 mmol/L (ref 0–100)
TCO2: 25 mmol/L (ref 0–100)
pCO2 arterial: 42.5 mmHg (ref 35.0–45.0)
pCO2 arterial: 49.3 mmHg — ABNORMAL HIGH (ref 35.0–45.0)
pCO2 arterial: 46.8 mmHg — ABNORMAL HIGH (ref 35.0–45.0)
pCO2 arterial: 49.6 mmHg — ABNORMAL HIGH (ref 35.0–45.0)
pCO2 arterial: 50.5 mmHg — ABNORMAL HIGH (ref 35.0–45.0)
pCO2 arterial: 45.3 mmHg — ABNORMAL HIGH (ref 35.0–45.0)
pH, Arterial: 7.385 (ref 7.350–7.450)
pH, Arterial: 7.322 — ABNORMAL LOW (ref 7.350–7.450)
pH, Arterial: 7.294 — ABNORMAL LOW (ref 7.350–7.450)
pH, Arterial: 7.342 — ABNORMAL LOW (ref 7.350–7.450)
pH, Arterial: 7.348 — ABNORMAL LOW (ref 7.350–7.450)
pH, Arterial: 7.321 — ABNORMAL LOW (ref 7.350–7.450)
pO2, Arterial: 183 mmHg — ABNORMAL HIGH (ref 80.0–100.0)
pO2, Arterial: 381 mmHg — ABNORMAL HIGH (ref 80.0–100.0)
pO2, Arterial: 84 mmHg (ref 80.0–100.0)
pO2, Arterial: 111 mmHg — ABNORMAL HIGH (ref 80.0–100.0)
pO2, Arterial: 107 mmHg — ABNORMAL HIGH (ref 80.0–100.0)
pO2, Arterial: 106 mmHg — ABNORMAL HIGH (ref 80.0–100.0)

## 2015-08-22 LAB — POCT I-STAT 4, (NA,K, GLUC, HGB,HCT)
Glucose, Bld: 140 mg/dL — ABNORMAL HIGH (ref 65–99)
HCT: 30 % — ABNORMAL LOW (ref 39.0–52.0)
Hemoglobin: 10.2 g/dL — ABNORMAL LOW (ref 13.0–17.0)
Potassium: 3.9 mmol/L (ref 3.5–5.1)
Sodium: 138 mmol/L (ref 135–145)

## 2015-08-22 LAB — PROTIME-INR
INR: 1.74 — ABNORMAL HIGH (ref 0.00–1.49)
Prothrombin Time: 20.3 seconds — ABNORMAL HIGH (ref 11.6–15.2)

## 2015-08-22 LAB — MAGNESIUM: Magnesium: 2.9 mg/dL — ABNORMAL HIGH (ref 1.7–2.4)

## 2015-08-22 LAB — APTT: aPTT: 36 seconds (ref 24–37)

## 2015-08-22 LAB — CREATININE, SERUM
Creatinine, Ser: 1.23 mg/dL (ref 0.61–1.24)
GFR calc Af Amer: >60 mL/min (ref >60)
GFR calc non Af Amer: 59 mL/min — ABNORMAL LOW (ref >60)

## 2015-08-22 LAB — PLATELET COUNT: Platelets: 176 10*3/uL (ref 150–400)

## 2015-08-22 LAB — HEMOGLOBIN AND HEMATOCRIT, BLOOD
HCT: 28.9 % — ABNORMAL LOW (ref 39.0–52.0)
Hemoglobin: 9.5 g/dL — ABNORMAL LOW (ref 13.0–17.0)

## 2015-08-22 SURGERY — REPLACEMENT, AORTIC VALVE, OPEN
Anesthesia: General | Site: Chest

## 2015-08-22 MED ORDER — SODIUM CHLORIDE 0.9 % IV SOLN
Freq: Once | INTRAVENOUS | Status: AC
  Administered 2015-08-22: 12:00:00 via INTRAVENOUS

## 2015-08-22 MED ORDER — MILRINONE LACTATE IN DEXTROSE 20-5 MG/100ML-% IV SOLN
INTRAVENOUS | Status: DC | PRN
  Administered 2015-08-22: 2.5 ug/kg/min via INTRAVENOUS

## 2015-08-22 MED ORDER — PROTAMINE SULFATE 10 MG/ML IV SOLN
INTRAVENOUS | Status: DC | PRN
Start: 2015-08-22 — End: 2015-08-22
  Administered 2015-08-22: 10 mg via INTRAVENOUS
  Administered 2015-08-22: 240 mg via INTRAVENOUS

## 2015-08-22 MED ORDER — FENTANYL CITRATE (PF) 250 MCG/5ML IJ SOLN
INTRAMUSCULAR | Status: AC
  Filled 2015-08-22: qty 25

## 2015-08-22 MED ORDER — LACTATED RINGERS IV SOLN
INTRAVENOUS | Status: DC | PRN
  Administered 2015-08-22: 08:00:00 via INTRAVENOUS

## 2015-08-22 MED ORDER — CHLORHEXIDINE GLUCONATE 4 % EX LIQD
30.0000 mL | CUTANEOUS | Status: DC
  Administered 2015-08-26: 2 via TOPICAL

## 2015-08-22 MED ORDER — HEPARIN SODIUM (PORCINE) 1000 UNIT/ML IJ SOLN
INTRAMUSCULAR | Status: DC | PRN
Start: 2015-08-22 — End: 2015-08-22
  Administered 2015-08-22: 28000 [IU] via INTRAVENOUS

## 2015-08-22 MED ORDER — SODIUM CHLORIDE 0.9 % IV SOLN: INTRAVENOUS | Status: DC

## 2015-08-22 MED ORDER — PHENYLEPHRINE HCL 10 MG/ML IJ SOLN: 0.0000 ug/min | INTRAVENOUS | Status: DC

## 2015-08-22 MED ORDER — HEMOSTATIC AGENTS (NO CHARGE) OPTIME
TOPICAL | Status: DC | PRN
  Administered 2015-08-22 (×2): 1 via TOPICAL

## 2015-08-22 MED ORDER — ONDANSETRON HCL 4 MG/2ML IJ SOLN
4.0000 mg | Freq: Four times a day (QID) | INTRAMUSCULAR | Status: DC | PRN
  Administered 2015-08-23 – 2015-08-25 (×4): 4 mg via INTRAVENOUS
  Filled 2015-08-22 (×4): qty 2

## 2015-08-22 MED ORDER — PROTAMINE SULFATE 10 MG/ML IV SOLN
INTRAVENOUS | Status: AC
Start: 2015-08-22 — End: 2015-08-22
  Filled 2015-08-22: qty 25

## 2015-08-22 MED ORDER — PROPOFOL 10 MG/ML IV BOLUS
INTRAVENOUS | Status: AC
  Filled 2015-08-22: qty 20

## 2015-08-22 MED ORDER — ATORVASTATIN CALCIUM 40 MG PO TABS
40.0000 mg | ORAL_TABLET | Freq: Every day | ORAL | Status: DC
  Administered 2015-08-22 – 2015-08-28 (×6): 40 mg via ORAL
  Filled 2015-08-22 (×7): qty 1

## 2015-08-22 MED ORDER — CHLORHEXIDINE GLUCONATE 0.12 % MT SOLN
15.0000 mL | Freq: Once | OROMUCOSAL | Status: AC
  Administered 2015-08-22: 15 mL via OROMUCOSAL

## 2015-08-22 MED ORDER — MILRINONE LACTATE IN DEXTROSE 20-5 MG/100ML-% IV SOLN
0.1250 ug/kg/min | INTRAVENOUS | Status: DC
  Administered 2015-08-23: 0.25 ug/kg/min via INTRAVENOUS
  Filled 2015-08-22 (×2): qty 100

## 2015-08-22 MED ORDER — LACTATED RINGERS IV SOLN: 500.0000 mL | Freq: Once | INTRAVENOUS | Status: DC | PRN

## 2015-08-22 MED ORDER — MIDAZOLAM HCL 2 MG/2ML IJ SOLN: 2.0000 mg | INTRAMUSCULAR | Status: DC | PRN

## 2015-08-22 MED ORDER — LORATADINE 10 MG PO TABS
10.0000 mg | ORAL_TABLET | Freq: Every day | ORAL | Status: DC
  Administered 2015-08-23 – 2015-08-28 (×5): 10 mg via ORAL
  Filled 2015-08-22 (×6): qty 1

## 2015-08-22 MED ORDER — LEVOTHYROXINE SODIUM 100 MCG PO TABS
175.0000 ug | ORAL_TABLET | Freq: Every day | ORAL | Status: DC
  Administered 2015-08-23 – 2015-08-28 (×6): 175 ug via ORAL
  Filled 2015-08-22 (×6): qty 1

## 2015-08-22 MED ORDER — FENTANYL CITRATE (PF) 250 MCG/5ML IJ SOLN
INTRAMUSCULAR | Status: AC
  Filled 2015-08-22: qty 5

## 2015-08-22 MED ORDER — NITROPRUSSIDE SODIUM 25 MG/ML IV SOLN
50000.0000 ug | INTRAVENOUS | Status: DC | PRN
  Administered 2015-08-22: 0.5 ug/kg/min via INTRAVENOUS

## 2015-08-22 MED ORDER — PROPOFOL 10 MG/ML IV BOLUS
INTRAVENOUS | Status: DC | PRN
  Administered 2015-08-22: 150 mg via INTRAVENOUS

## 2015-08-22 MED ORDER — SODIUM CHLORIDE 0.9% FLUSH
3.0000 mL | Freq: Two times a day (BID) | INTRAVENOUS | Status: DC
  Administered 2015-08-23 – 2015-08-26 (×3): 3 mL via INTRAVENOUS

## 2015-08-22 MED ORDER — ASPIRIN 81 MG PO CHEW
324.0000 mg | CHEWABLE_TABLET | Freq: Every day | ORAL | Status: DC
  Administered 2015-08-26: 324 mg
  Filled 2015-08-22 (×2): qty 4

## 2015-08-22 MED ORDER — SODIUM CHLORIDE 0.45 % IV SOLN: INTRAVENOUS | Status: DC | PRN

## 2015-08-22 MED ORDER — METOPROLOL TARTRATE 25 MG/10 ML ORAL SUSPENSION
12.5000 mg | Freq: Two times a day (BID) | ORAL | Status: DC
  Administered 2015-08-22: 12.5 mg
  Filled 2015-08-22 (×2): qty 5

## 2015-08-22 MED ORDER — NITROPRUSSIDE SODIUM 25 MG/ML IV SOLN
0.0000 ug/kg/min | INTRAVENOUS | Status: DC
  Administered 2015-08-22: 2 ug/kg/min via INTRAVENOUS
  Administered 2015-08-23: 1 ug/kg/min via INTRAVENOUS
  Administered 2015-08-23: 2 ug/kg/min via INTRAVENOUS
  Filled 2015-08-22 (×3): qty 2

## 2015-08-22 MED ORDER — LABETALOL HCL 5 MG/ML IV SOLN
INTRAVENOUS | Status: DC | PRN
  Administered 2015-08-22 (×4): 10 mg via INTRAVENOUS

## 2015-08-22 MED ORDER — DEXMEDETOMIDINE HCL IN NACL 200 MCG/50ML IV SOLN: 0.0000 ug/kg/h | INTRAVENOUS | Status: DC

## 2015-08-22 MED ORDER — DOCUSATE SODIUM 100 MG PO CAPS
200.0000 mg | ORAL_CAPSULE | Freq: Every day | ORAL | Status: DC
  Administered 2015-08-23 – 2015-08-26 (×3): 200 mg via ORAL
  Filled 2015-08-22 (×5): qty 2

## 2015-08-22 MED ORDER — NITROGLYCERIN 0.2 MG/ML ON CALL CATH LAB
INTRAVENOUS | Status: DC | PRN
  Administered 2015-08-22 (×2): 40 ug via INTRAVENOUS

## 2015-08-22 MED ORDER — LACTATED RINGERS IV SOLN: INTRAVENOUS | Status: DC

## 2015-08-22 MED ORDER — FAMOTIDINE IN NACL 20-0.9 MG/50ML-% IV SOLN
20.0000 mg | Freq: Two times a day (BID) | INTRAVENOUS | Status: DC
  Administered 2015-08-22: 20 mg via INTRAVENOUS

## 2015-08-22 MED ORDER — CHLORHEXIDINE GLUCONATE 0.12 % MT SOLN
15.0000 mL | OROMUCOSAL | Status: AC
  Administered 2015-08-22: 15 mL via OROMUCOSAL
  Filled 2015-08-22: qty 15

## 2015-08-22 MED ORDER — HEPARIN SODIUM (PORCINE) 1000 UNIT/ML IJ SOLN
INTRAMUSCULAR | Status: AC
  Filled 2015-08-22: qty 1

## 2015-08-22 MED ORDER — TRAMADOL HCL 50 MG PO TABS
50.0000 mg | ORAL_TABLET | ORAL | Status: DC | PRN
  Administered 2015-08-23 (×2): 100 mg via ORAL
  Administered 2015-08-24: 50 mg via ORAL
  Administered 2015-08-24 (×2): 100 mg via ORAL
  Filled 2015-08-22: qty 1
  Filled 2015-08-22 (×4): qty 2

## 2015-08-22 MED ORDER — ACETAMINOPHEN 650 MG RE SUPP
650.0000 mg | Freq: Once | RECTAL | Status: AC
  Administered 2015-08-22: 650 mg via RECTAL

## 2015-08-22 MED ORDER — PANTOPRAZOLE SODIUM 40 MG PO TBEC
40.0000 mg | DELAYED_RELEASE_TABLET | Freq: Every day | ORAL | Status: DC
  Administered 2015-08-23 – 2015-08-28 (×5): 40 mg via ORAL
  Filled 2015-08-22 (×6): qty 1

## 2015-08-22 MED ORDER — NITROPRUSSIDE SODIUM 25 MG/ML IV SOLN
0.0000 ug/kg/min | INTRAVENOUS | Status: DC
  Filled 2015-08-22: qty 2

## 2015-08-22 MED ORDER — ALBUTEROL SULFATE (2.5 MG/3ML) 0.083% IN NEBU: 2.5000 mg | INHALATION_SOLUTION | Freq: Four times a day (QID) | RESPIRATORY_TRACT | Status: DC | PRN

## 2015-08-22 MED ORDER — ROPINIROLE HCL 1 MG PO TABS
1.0000 mg | ORAL_TABLET | Freq: Every day | ORAL | Status: DC
  Administered 2015-08-22 – 2015-08-24 (×3): 1 mg via ORAL
  Filled 2015-08-22 (×7): qty 1

## 2015-08-22 MED ORDER — 0.9 % SODIUM CHLORIDE (POUR BTL) OPTIME
TOPICAL | Status: DC | PRN
  Administered 2015-08-22: 6000 mL

## 2015-08-22 MED ORDER — NITROGLYCERIN IN D5W 200-5 MCG/ML-% IV SOLN: 0.0000 ug/min | INTRAVENOUS | Status: DC

## 2015-08-22 MED ORDER — LACTATED RINGERS IV SOLN
INTRAVENOUS | Status: DC | PRN
Start: 2015-08-22 — End: 2015-08-22
  Administered 2015-08-22: 07:00:00 via INTRAVENOUS

## 2015-08-22 MED ORDER — VANCOMYCIN HCL IN DEXTROSE 1-5 GM/200ML-% IV SOLN
1000.0000 mg | Freq: Once | INTRAVENOUS | Status: AC
Start: 2015-08-22 — End: 2015-08-22
  Administered 2015-08-22: 1000 mg via INTRAVENOUS
  Filled 2015-08-22: qty 200

## 2015-08-22 MED ORDER — ALBUTEROL SULFATE HFA 108 (90 BASE) MCG/ACT IN AERS
INHALATION_SPRAY | RESPIRATORY_TRACT | Status: AC
  Filled 2015-08-22: qty 6.7

## 2015-08-22 MED ORDER — LIDOCAINE HCL (CARDIAC) 20 MG/ML IV SOLN
INTRAVENOUS | Status: DC | PRN
  Administered 2015-08-22: 100 mg via INTRAVENOUS

## 2015-08-22 MED ORDER — LACTATED RINGERS IV SOLN
INTRAVENOUS | Status: DC | PRN
  Administered 2015-08-22: 07:00:00 via INTRAVENOUS

## 2015-08-22 MED ORDER — METOPROLOL TARTRATE 5 MG/5ML IV SOLN
2.5000 mg | INTRAVENOUS | Status: DC | PRN
Start: 2015-08-22 — End: 2015-08-28
  Administered 2015-08-22: 5 mg via INTRAVENOUS
  Administered 2015-08-24: 2.5 mg via INTRAVENOUS
  Administered 2015-08-24: 5 mg via INTRAVENOUS
  Administered 2015-08-24: 2.5 mg via INTRAVENOUS
  Filled 2015-08-22 (×3): qty 5

## 2015-08-22 MED ORDER — ALBUTEROL SULFATE HFA 108 (90 BASE) MCG/ACT IN AERS
INHALATION_SPRAY | RESPIRATORY_TRACT | Status: DC | PRN
  Administered 2015-08-22: 6 via RESPIRATORY_TRACT

## 2015-08-22 MED ORDER — POTASSIUM CHLORIDE 10 MEQ/50ML IV SOLN
10.0000 meq | INTRAVENOUS | Status: AC
  Administered 2015-08-22 (×3): 10 meq via INTRAVENOUS

## 2015-08-22 MED ORDER — LABETALOL HCL 5 MG/ML IV SOLN
INTRAVENOUS | Status: AC
  Filled 2015-08-22: qty 8

## 2015-08-22 MED ORDER — ACETAMINOPHEN 500 MG PO TABS
1000.0000 mg | ORAL_TABLET | Freq: Four times a day (QID) | ORAL | Status: AC
  Administered 2015-08-23 – 2015-08-27 (×16): 1000 mg via ORAL
  Filled 2015-08-22 (×15): qty 2

## 2015-08-22 MED ORDER — AMIODARONE HCL IN DEXTROSE 360-4.14 MG/200ML-% IV SOLN
60.0000 mg/h | INTRAVENOUS | Status: DC
  Filled 2015-08-22: qty 200

## 2015-08-22 MED ORDER — ROCURONIUM BROMIDE 50 MG/5ML IV SOLN
INTRAVENOUS | Status: AC
  Filled 2015-08-22: qty 2

## 2015-08-22 MED ORDER — MORPHINE SULFATE (PF) 2 MG/ML IV SOLN
1.0000 mg | INTRAVENOUS | Status: AC | PRN
  Administered 2015-08-22: 4 mg via INTRAVENOUS
  Administered 2015-08-22: 1 mg via INTRAVENOUS
  Administered 2015-08-22 (×2): 4 mg via INTRAVENOUS
  Filled 2015-08-22: qty 2
  Filled 2015-08-22: qty 1
  Filled 2015-08-22 (×2): qty 2

## 2015-08-22 MED ORDER — AMIODARONE HCL IN DEXTROSE 360-4.14 MG/200ML-% IV SOLN: 60.0000 mg/h | INTRAVENOUS | Status: AC

## 2015-08-22 MED ORDER — ACETAMINOPHEN 160 MG/5ML PO SOLN: 650.0000 mg | Freq: Once | ORAL | Status: AC

## 2015-08-22 MED ORDER — AMIODARONE HCL IN DEXTROSE 360-4.14 MG/200ML-% IV SOLN
INTRAVENOUS | Status: DC | PRN
  Administered 2015-08-22: 60 mg/h via INTRAVENOUS

## 2015-08-22 MED ORDER — OXYCODONE HCL 5 MG PO TABS
5.0000 mg | ORAL_TABLET | ORAL | Status: DC | PRN
  Administered 2015-08-23 (×3): 10 mg via ORAL
  Administered 2015-08-23: 5 mg via ORAL
  Administered 2015-08-23: 10 mg via ORAL
  Administered 2015-08-24: 5 mg via ORAL
  Administered 2015-08-24: 10 mg via ORAL
  Administered 2015-08-24: 5 mg via ORAL
  Administered 2015-08-24: 10 mg via ORAL
  Filled 2015-08-22: qty 1
  Filled 2015-08-22: qty 2
  Filled 2015-08-22: qty 1
  Filled 2015-08-22: qty 2
  Filled 2015-08-22: qty 1
  Filled 2015-08-22 (×3): qty 2
  Filled 2015-08-22: qty 1
  Filled 2015-08-22: qty 2

## 2015-08-22 MED ORDER — MILRINONE LACTATE IN DEXTROSE 20-5 MG/100ML-% IV SOLN: 0.3000 ug/kg/min | INTRAVENOUS | Status: DC

## 2015-08-22 MED ORDER — MIDAZOLAM HCL 5 MG/5ML IJ SOLN
INTRAMUSCULAR | Status: DC | PRN
  Administered 2015-08-22 (×5): 2 mg via INTRAVENOUS

## 2015-08-22 MED ORDER — INSULIN REGULAR BOLUS VIA INFUSION
0.0000 [IU] | Freq: Three times a day (TID) | INTRAVENOUS | Status: DC
  Filled 2015-08-22: qty 10

## 2015-08-22 MED ORDER — LIDOCAINE HCL (CARDIAC) 20 MG/ML IV SOLN
INTRAVENOUS | Status: AC
  Filled 2015-08-22: qty 5

## 2015-08-22 MED ORDER — SODIUM CHLORIDE 0.9 % IV SOLN: 0.2500 ug/kg/min | INTRAVENOUS | Status: DC

## 2015-08-22 MED ORDER — ACETAMINOPHEN 160 MG/5ML PO SOLN: 1000.0000 mg | Freq: Four times a day (QID) | ORAL | Status: AC

## 2015-08-22 MED ORDER — SUCCINYLCHOLINE CHLORIDE 20 MG/ML IJ SOLN
INTRAMUSCULAR | Status: AC
  Filled 2015-08-22: qty 1

## 2015-08-22 MED ORDER — ASPIRIN EC 325 MG PO TBEC
325.0000 mg | DELAYED_RELEASE_TABLET | Freq: Every day | ORAL | Status: DC
  Administered 2015-08-23 – 2015-08-28 (×4): 325 mg via ORAL
  Filled 2015-08-22 (×6): qty 1

## 2015-08-22 MED ORDER — MIDAZOLAM HCL 10 MG/2ML IJ SOLN
INTRAMUSCULAR | Status: AC
  Filled 2015-08-22: qty 2

## 2015-08-22 MED ORDER — BISACODYL 5 MG PO TBEC
10.0000 mg | DELAYED_RELEASE_TABLET | Freq: Every day | ORAL | Status: DC
  Administered 2015-08-23 – 2015-08-24 (×2): 10 mg via ORAL
  Filled 2015-08-22 (×5): qty 2

## 2015-08-22 MED ORDER — ALBUMIN HUMAN 5 % IV SOLN
250.0000 mL | INTRAVENOUS | Status: AC | PRN
  Administered 2015-08-22: 250 mL via INTRAVENOUS

## 2015-08-22 MED ORDER — METOCLOPRAMIDE HCL 5 MG/ML IJ SOLN
10.0000 mg | Freq: Four times a day (QID) | INTRAMUSCULAR | Status: AC
  Administered 2015-08-22 – 2015-08-23 (×4): 10 mg via INTRAVENOUS
  Filled 2015-08-22 (×3): qty 2

## 2015-08-22 MED ORDER — AMIODARONE HCL IN DEXTROSE 360-4.14 MG/200ML-% IV SOLN
30.0000 mg/h | INTRAVENOUS | Status: DC
  Administered 2015-08-22 – 2015-08-23 (×3): 30 mg/h via INTRAVENOUS
  Filled 2015-08-22 (×2): qty 200

## 2015-08-22 MED ORDER — AMIODARONE HCL IN DEXTROSE 360-4.14 MG/200ML-% IV SOLN
30.0000 mg/h | INTRAVENOUS | Status: DC
  Filled 2015-08-22 (×2): qty 200

## 2015-08-22 MED ORDER — PANTOPRAZOLE SODIUM 40 MG PO TBEC: 40.0000 mg | DELAYED_RELEASE_TABLET | Freq: Every day | ORAL | Status: DC

## 2015-08-22 MED ORDER — BISACODYL 10 MG RE SUPP
10.0000 mg | Freq: Every day | RECTAL | Status: DC
  Filled 2015-08-22: qty 1

## 2015-08-22 MED ORDER — FLUTICASONE PROPIONATE 50 MCG/ACT NA SUSP: 2.0000 | Freq: Two times a day (BID) | NASAL | Status: DC | PRN

## 2015-08-22 MED ORDER — SODIUM CHLORIDE 0.9 % IV SOLN
0.2500 ug/kg/min | INTRAVENOUS | Status: DC
Start: 2015-08-22 — End: 2015-08-22

## 2015-08-22 MED ORDER — ROCURONIUM BROMIDE 100 MG/10ML IV SOLN
INTRAVENOUS | Status: DC | PRN
  Administered 2015-08-22: 100 mg via INTRAVENOUS
  Administered 2015-08-22 (×2): 50 mg via INTRAVENOUS

## 2015-08-22 MED ORDER — MAGNESIUM SULFATE 4 GM/100ML IV SOLN
4.0000 g | Freq: Once | INTRAVENOUS | Status: AC
Start: 2015-08-22 — End: 2015-08-22
  Administered 2015-08-22: 4 g via INTRAVENOUS
  Filled 2015-08-22: qty 100

## 2015-08-22 MED ORDER — PHENYLEPHRINE HCL 10 MG/ML IJ SOLN
INTRAMUSCULAR | Status: DC | PRN
  Administered 2015-08-22 (×3): 80 ug via INTRAVENOUS
  Administered 2015-08-22: 120 ug via INTRAVENOUS
  Administered 2015-08-22: 40 ug via INTRAVENOUS
  Administered 2015-08-22: 80 ug via INTRAVENOUS

## 2015-08-22 MED ORDER — SODIUM CHLORIDE 0.9% FLUSH
3.0000 mL | INTRAVENOUS | Status: DC | PRN
  Administered 2015-08-23: 3 mL via INTRAVENOUS
  Filled 2015-08-22: qty 3

## 2015-08-22 MED ORDER — METOPROLOL TARTRATE 12.5 MG HALF TABLET
12.5000 mg | ORAL_TABLET | Freq: Two times a day (BID) | ORAL | Status: DC
  Administered 2015-08-23 (×2): 12.5 mg via ORAL
  Filled 2015-08-22 (×2): qty 1

## 2015-08-22 MED ORDER — CEFUROXIME SODIUM 1.5 G IJ SOLR
1.5000 g | Freq: Two times a day (BID) | INTRAMUSCULAR | Status: AC
  Administered 2015-08-22 – 2015-08-24 (×4): 1.5 g via INTRAVENOUS
  Filled 2015-08-22 (×4): qty 1.5

## 2015-08-22 MED ORDER — FENTANYL CITRATE (PF) 100 MCG/2ML IJ SOLN
INTRAMUSCULAR | Status: DC | PRN
  Administered 2015-08-22: 250 ug via INTRAVENOUS
  Administered 2015-08-22: 100 ug via INTRAVENOUS
  Administered 2015-08-22: 300 ug via INTRAVENOUS
  Administered 2015-08-22: 150 ug via INTRAVENOUS
  Administered 2015-08-22: 350 ug via INTRAVENOUS
  Administered 2015-08-22: 150 ug via INTRAVENOUS
  Administered 2015-08-22: 50 ug via INTRAVENOUS
  Administered 2015-08-22: 150 ug via INTRAVENOUS

## 2015-08-22 MED ORDER — SODIUM CHLORIDE 0.9 % IJ SOLN
OROMUCOSAL | Status: DC | PRN
  Administered 2015-08-22 (×3): 4 mL via TOPICAL

## 2015-08-22 MED ORDER — MORPHINE SULFATE (PF) 2 MG/ML IV SOLN
2.0000 mg | INTRAVENOUS | Status: DC | PRN
  Administered 2015-08-23 – 2015-08-24 (×4): 2 mg via INTRAVENOUS
  Filled 2015-08-22: qty 2
  Filled 2015-08-22 (×3): qty 1

## 2015-08-22 MED ORDER — ARTIFICIAL TEARS OP OINT
TOPICAL_OINTMENT | OPHTHALMIC | Status: DC | PRN
  Administered 2015-08-22: 1 via OPHTHALMIC

## 2015-08-22 MED ORDER — MILRINONE LACTATE IN DEXTROSE 20-5 MG/100ML-% IV SOLN
0.1250 ug/kg/min | INTRAVENOUS | Status: DC
  Filled 2015-08-22: qty 100

## 2015-08-22 MED ORDER — SODIUM CHLORIDE 0.9 % IV SOLN
250.0000 mL | INTRAVENOUS | Status: DC
Start: 2015-08-23 — End: 2015-08-28
  Administered 2015-08-23: 250 mL via INTRAVENOUS

## 2015-08-22 MED FILL — Sodium Bicarbonate IV Soln 8.4%: INTRAVENOUS | Qty: 50 | Status: AC

## 2015-08-22 MED FILL — Potassium Chloride Inj 2 mEq/ML: INTRAVENOUS | Qty: 40 | Status: AC

## 2015-08-22 MED FILL — Heparin Sodium (Porcine) Inj 1000 Unit/ML: INTRAMUSCULAR | Qty: 20 | Status: AC

## 2015-08-22 MED FILL — Electrolyte-R (PH 7.4) Solution: INTRAVENOUS | Qty: 5000 | Status: AC

## 2015-08-22 MED FILL — Lidocaine HCl IV Inj 20 MG/ML: INTRAVENOUS | Qty: 5 | Status: AC

## 2015-08-22 MED FILL — Mannitol IV Soln 20%: INTRAVENOUS | Qty: 500 | Status: AC

## 2015-08-22 MED FILL — Heparin Sodium (Porcine) Inj 1000 Unit/ML: INTRAMUSCULAR | Qty: 30 | Status: AC

## 2015-08-22 MED FILL — Magnesium Sulfate Inj 50%: INTRAMUSCULAR | Qty: 10 | Status: AC

## 2015-08-22 MED FILL — Sodium Chloride IV Soln 0.9%: INTRAVENOUS | Qty: 3000 | Status: AC

## 2015-08-22 SURGICAL SUPPLY — 90 items
ADAPTER CARDIO PERF ANTE/RETRO (ADAPTER) ×4 IMPLANT
ATRICLIP EXCLUSION 40 STD HAND (Clip) ×4 IMPLANT
BAG DECANTER FOR FLEXI CONT (MISCELLANEOUS) ×4 IMPLANT
BLADE STERNUM SYSTEM 6 (BLADE) ×4 IMPLANT
BLADE SURG 12 STRL SS (BLADE) ×4 IMPLANT
BLADE SURG 15 STRL LF DISP TIS (BLADE) ×2 IMPLANT
BLADE SURG 15 STRL SS (BLADE) ×2
CANISTER SUCTION 2500CC (MISCELLANEOUS) ×4 IMPLANT
CANNULA GUNDRY RCSP 15FR (MISCELLANEOUS) ×4 IMPLANT
CARDIOBLATE CARDIAC ABLATION (MISCELLANEOUS)
CATH CPB KIT VANTRIGT (MISCELLANEOUS) ×4 IMPLANT
CATH HEART VENT LEFT (CATHETERS) ×4 IMPLANT
CATH RETROPLEGIA CORONARY 14FR (CATHETERS) ×4 IMPLANT
CATH ROBINSON RED A/P 18FR (CATHETERS) ×12 IMPLANT
CATH THORACIC 36FR RT ANG (CATHETERS) ×4 IMPLANT
CLAMP ISOLATOR SYNERGY LG (MISCELLANEOUS) ×4 IMPLANT
CLIP FOGARTY SPRING 6M (CLIP) IMPLANT
CONT SPEC 4OZ CLIKSEAL STRL BL (MISCELLANEOUS) ×4 IMPLANT
COVER SURGICAL LIGHT HANDLE (MISCELLANEOUS) IMPLANT
CRADLE DONUT ADULT HEAD (MISCELLANEOUS) ×4 IMPLANT
DEVICE CARDIOBLATE CARDIAC ABL (MISCELLANEOUS) IMPLANT
DRAIN CHANNEL 32F RND 10.7 FF (WOUND CARE) ×4 IMPLANT
DRAPE CARDIOVASCULAR INCISE (DRAPES) ×2
DRAPE SLUSH/WARMER DISC (DRAPES) ×4 IMPLANT
DRAPE SRG 135X102X78XABS (DRAPES) ×2 IMPLANT
DRSG AQUACEL AG ADV 3.5X14 (GAUZE/BANDAGES/DRESSINGS) ×4 IMPLANT
ELECT BLADE 4.0 EZ CLEAN MEGAD (MISCELLANEOUS) ×4
ELECT BLADE 6.5 EXT (BLADE) ×4 IMPLANT
ELECT CAUTERY BLADE 6.4 (BLADE) ×4 IMPLANT
ELECT REM PT RETURN 9FT ADLT (ELECTROSURGICAL) ×8
ELECTRODE BLDE 4.0 EZ CLN MEGD (MISCELLANEOUS) ×2 IMPLANT
ELECTRODE REM PT RTRN 9FT ADLT (ELECTROSURGICAL) ×4 IMPLANT
FELT TEFLON 1X6 (MISCELLANEOUS) ×4 IMPLANT
GAUZE SPONGE 4X4 12PLY STRL (GAUZE/BANDAGES/DRESSINGS) ×4 IMPLANT
GLOVE BIO SURGEON STRL SZ 6.5 (GLOVE) ×3 IMPLANT
GLOVE BIO SURGEON STRL SZ7 (GLOVE) ×8 IMPLANT
GLOVE BIO SURGEON STRL SZ7.5 (GLOVE) ×12 IMPLANT
GLOVE BIO SURGEONS STRL SZ 6.5 (GLOVE) ×1
GLOVE BIOGEL PI IND STRL 6 (GLOVE) ×4 IMPLANT
GLOVE BIOGEL PI IND STRL 6.5 (GLOVE) ×4 IMPLANT
GLOVE BIOGEL PI IND STRL 7.0 (GLOVE) ×4 IMPLANT
GLOVE BIOGEL PI INDICATOR 6 (GLOVE) ×4
GLOVE BIOGEL PI INDICATOR 6.5 (GLOVE) ×4
GLOVE BIOGEL PI INDICATOR 7.0 (GLOVE) ×4
GLOVE ORTHO TXT STRL SZ7.5 (GLOVE) ×4 IMPLANT
GOWN STRL REUS W/ TWL LRG LVL3 (GOWN DISPOSABLE) ×12 IMPLANT
GOWN STRL REUS W/TWL LRG LVL3 (GOWN DISPOSABLE) ×12
HEMOSTAT POWDER SURGIFOAM 1G (HEMOSTASIS) ×12 IMPLANT
HEMOSTAT SURGICEL 2X14 (HEMOSTASIS) ×4 IMPLANT
INSERT FOGARTY XLG (MISCELLANEOUS) ×4 IMPLANT
KIT BASIN OR (CUSTOM PROCEDURE TRAY) ×4 IMPLANT
KIT ROOM TURNOVER OR (KITS) ×4 IMPLANT
KIT SUCTION CATH 14FR (SUCTIONS) ×4 IMPLANT
LEAD PACING MYOCARDI (MISCELLANEOUS) ×4 IMPLANT
LINE VENT (MISCELLANEOUS) ×4 IMPLANT
LOOP VESSEL SUPERMAXI WHITE (MISCELLANEOUS) ×4 IMPLANT
NS IRRIG 1000ML POUR BTL (IV SOLUTION) ×28 IMPLANT
PACK OPEN HEART (CUSTOM PROCEDURE TRAY) ×4 IMPLANT
PAD ARMBOARD 7.5X6 YLW CONV (MISCELLANEOUS) ×16 IMPLANT
PROBE CRYO2-ABLATION MALLABLE (MISCELLANEOUS) IMPLANT
SET CARDIOPLEGIA MPS 5001102 (MISCELLANEOUS) ×4 IMPLANT
SURGIFLO W/THROMBIN 8M KIT (HEMOSTASIS) ×4 IMPLANT
SUT BONE WAX W31G (SUTURE) ×4 IMPLANT
SUT ETHIBON 2 0 V 52N 30 (SUTURE) ×8 IMPLANT
SUT ETHIBOND 2 0 SH (SUTURE) ×2
SUT ETHIBOND 2 0 SH 36X2 (SUTURE) ×2 IMPLANT
SUT PROLENE 3 0 RB 1 (SUTURE) ×4 IMPLANT
SUT PROLENE 3 0 SH 1 (SUTURE) IMPLANT
SUT PROLENE 3 0 SH DA (SUTURE) ×4 IMPLANT
SUT PROLENE 4 0 RB 1 (SUTURE) ×10
SUT PROLENE 4 0 SH DA (SUTURE) ×12 IMPLANT
SUT PROLENE 4-0 RB1 .5 CRCL 36 (SUTURE) ×10 IMPLANT
SUT SILK 2 0 SH CR/8 (SUTURE) ×4 IMPLANT
SUT STEEL 6MS V (SUTURE) IMPLANT
SUT STEEL STERNAL CCS#1 18IN (SUTURE) ×4 IMPLANT
SUT STEEL SZ 6 DBL 3X14 BALL (SUTURE) ×8 IMPLANT
SUT VIC AB 1 CTX 36 (SUTURE) ×4
SUT VIC AB 1 CTX36XBRD ANBCTR (SUTURE) ×4 IMPLANT
SUT VIC AB 2-0 CTX 27 (SUTURE) IMPLANT
SUT VIC AB 3-0 X1 27 (SUTURE) IMPLANT
SYR 10ML KIT SKIN ADHESIVE (MISCELLANEOUS) IMPLANT
SYSTEM SAHARA CHEST DRAIN ATS (WOUND CARE) ×4 IMPLANT
TAPE CLOTH SURG 4X10 WHT LF (GAUZE/BANDAGES/DRESSINGS) ×4 IMPLANT
TOWEL OR 17X24 6PK STRL BLUE (TOWEL DISPOSABLE) ×8 IMPLANT
TOWEL OR 17X26 10 PK STRL BLUE (TOWEL DISPOSABLE) ×8 IMPLANT
TRAY FOLEY IC TEMP SENS 16FR (CATHETERS) ×4 IMPLANT
UNDERPAD 30X30 INCONTINENT (UNDERPADS AND DIAPERS) ×4 IMPLANT
VALVE MAGNA EASE AORTIC 25MM (Prosthesis & Implant Heart) ×4 IMPLANT
VENT LEFT HEART 12002 (CATHETERS) ×8
WATER STERILE IRR 1000ML POUR (IV SOLUTION) ×8 IMPLANT

## 2015-08-22 NOTE — Procedures (Signed)
Extubation Procedure Note  Patient Details:   Name: [name] DOB: [birth year] MRN: [id number]   Airway Documentation:  Airway 8.5 mm (Active)  Secured at (cm) 22 cm 08/22/2015  7:26 PM  Measured From Lips 08/22/2015  7:26 PM  Secured Location Right 08/22/2015  7:26 PM  Secured By Pink Tape 08/22/2015  7:26 PM  Site Condition Dry 08/22/2015  7:26 PM    Evaluation  O2 sats: stable throughout Complications: No apparent complications Patient did tolerate procedure well. Bilateral Breath Sounds: Diminished, Clear   Yes   Rapid wean protocol passed.  Cuff leak positive.  Extubated to 5L [place] without complication.  Able to cough and speak.  RN to instruct IS.  [name] 08/22/2015, 9:45 PM

## 2015-08-22 NOTE — Progress Notes (Signed)
Contacted Dr. Prescott Gum with patient's ABG results d/t to CO2 of 50.5. Instructed to put patient back on support for a few hours and re-evaluate. He wants repeat CO2 to be at least 45. Passed onto HS RN and RT. Will continue to monitor. Richardean Sale, RN

## 2015-08-22 NOTE — Progress Notes (Signed)
Patient tolerating PSV well.  Following commands appropriately and able to lift head from pillow.  NIF: -36 VC: 864 ml  ABG within protocol limits.

## 2015-08-22 NOTE — Anesthesia Procedure Notes (Addendum)
Procedure Name: Intubation Date/Time: 08/22/2015 7:53 AM Performed by: Salli Quarry WEAVER Pre-anesthesia Checklist: Patient identified, Emergency Drugs available, Suction available and Patient being monitored Patient Re-evaluated:Patient Re-evaluated prior to inductionOxygen Delivery Method: Circle System Utilized Preoxygenation: Pre-oxygenation with 100% oxygen Intubation Type: IV induction Ventilation: Mask ventilation without difficulty Laryngoscope Size: Mac and 4 Grade View: Grade I Tube type: Oral Tube size: 8.5 mm Number of attempts: 1 Airway Equipment and Method: Stylet Placement Confirmation: ETT inserted through vocal cords under direct vision,  positive ETCO2 and breath sounds checked- equal and bilateral Secured at: 23 cm Tube secured with: Tape Dental Injury: Teeth and Oropharynx as per pre-operative assessment     Procedures: Right IJ Gordy Councilman Catheter Insertion:[id number]: The patient was identified and consent obtained.  TO was performed, and full barrier precautions were used.  The skin was anesthetized with lidocaine-4cc plain with 25g needle.  Once the vein was located with the 22 ga. needle using ultrasound guidance , the wire was inserted into the vein.  The wire location was confirmed with ultrasound.  The tissue was dilated and the 8.5 [place] cordis catheter was carefully inserted. Afterwards Gordy Councilman catheter was inserted. PA catheter at 50cm. RIJ attempt unsuccessful.   The patient tolerated the procedure well.

## 2015-08-22 NOTE — Progress Notes (Signed)
The patient was examined and preop studies reviewed. There has been no change from the prior exam and the patient is ready for surgery.   Plan AVR, LA clip and MAze procedure on J Madia

## 2015-08-22 NOTE — Progress Notes (Signed)
  Echocardiogram 2D Echocardiogram has been performed.  Jennette Dubin 08/22/2015, 9:45 AM

## 2015-08-22 NOTE — Progress Notes (Signed)
RT attempted to get patient to perform NIF and VC. Patient showed very little effort. RT will attempt again later. RN aware. RT will continue to monitor.

## 2015-08-22 NOTE — Transfer of Care (Signed)
Immediate Anesthesia Transfer of Care Note  Patient: [name]  Procedure(s) Performed: Procedure(s): AORTIC VALVE REPLACEMENT (AVR) (N/A) MAZE (N/A) CLIPPING OF ATRIAL APPENDAGE (N/A) TRANSESOPHAGEAL ECHOCARDIOGRAM (TEE) (N/A)  Patient Location: PACU  Anesthesia Type:General  Level of Consciousness: Patient remains intubated per anesthesia plan  Airway & Oxygen Therapy: Patient remains intubated per anesthesia plan and Patient placed on Ventilator (see vital sign flow sheet for setting)  Post-op Assessment: Report given to RN and Post -op Vital signs reviewed and stable  Post vital signs: Reviewed and stable  Last Vitals:  Filed Vitals:   08/22/15 0607 08/22/15 1248  BP: 167/81 125/48  Pulse: 70 82  Temp: 37.2 C   Resp: 18 12    Complications: No apparent anesthesia complications

## 2015-08-22 NOTE — Progress Notes (Signed)
      [address]       [place] [address]             [phone]      S/p AVR, maze, LAA clip  Intubated, starting to wean  BP 106/73 mmHg  Pulse 73  Temp(Src) 96.6 F (35.9 C) (Core (Comment))  Resp 22  Wt 217 lb (98.431 kg)  SpO2 100%   Intake/Output Summary (Last 24 hours) at 08/22/15 1819 Last data filed at 08/22/15 1600  Gross per 24 hour  Intake 4404.46 ml  Output   4125 ml  Net 279.46 ml    Has been hypertensive, up to a 100 mcg/min on nitroglycerin and has had 5 mg lopressor IV, will start Nipride  Index on low side at last check- probably mostly afterload related  Remo Lipps C. Roxan Hockey, MD [facility] [phone]

## 2015-08-22 NOTE — OR Nursing (Addendum)
Forty five minute call to SICU charge nurse at 1143. Spoke to [place]. 2nd Call to SICU @1215 , Spoke to [place] On Transport call [phone]

## 2015-08-22 NOTE — Anesthesia Postprocedure Evaluation (Signed)
Anesthesia Post Note  Patient: [name]  Procedure(s) Performed: Procedure(s) (LRB): AORTIC VALVE REPLACEMENT (AVR) (N/A) MAZE (N/A) CLIPPING OF ATRIAL APPENDAGE (N/A) TRANSESOPHAGEAL ECHOCARDIOGRAM (TEE) (N/A)  Patient location during evaluation: SICU Anesthesia Type: General Level of consciousness: sedated and patient remains intubated per anesthesia plan Pain management: pain level controlled Vital Signs Assessment: post-procedure vital signs reviewed and stable Respiratory status: patient remains intubated per anesthesia plan Cardiovascular status: stable (phenylephrine gtt) Anesthetic complications: no    Last Vitals:  Filed Vitals:   08/22/15 1630 08/22/15 1652  BP:  146/67  Pulse: 73 74  Temp: 36.2 C   Resp:  22    Last Pain:  Filed Vitals:   08/22/15 1652  PainSc: 0-No pain                 Catalina Gravel

## 2015-08-22 NOTE — Brief Op Note (Signed)
08/22/2015  11:11 AM  PATIENT:  [name]  65 y.o. male  PRE-OPERATIVE DIAGNOSIS:  AS AFIB  POST-OPERATIVE DIAGNOSIS:  AS AFIB  PROCEDURE:  Procedure(s) : AORTIC VALVE REPLACEMENT  -25 mm Edwards Magna Ease Pericardial Tissue Valve  MAZE (N/A) -Pulmonary vein isolation with ablation therapy  CLIPPING OF ATRIAL APPENDAGE (N/A) -40 mm AtriClip  TRANSESOPHAGEAL ECHOCARDIOGRAM (TEE) (N/A)  SURGEON:  Surgeon(s) and Role:    * Ivin Poot, MD - Primary  PHYSICIAN ASSISTANT: Ellwood Handler PA-C  ANESTHESIA:   general  EBL:  Total I/O In: -  Out: 400 [Urine:400]  BLOOD ADMINISTERED: CELLSAVER and 2 FFP  DRAINS: Mediastinal Chest tubes   LOCAL MEDICATIONS USED:  NONE  SPECIMEN:  Source of Specimen:  Aortic Valve Leaflets  DISPOSITION OF SPECIMEN:  PATHOLOGY  COUNTS:  YES  TOURNIQUET:  * No tourniquets in log *  DICTATION: .Dragon Dictation  PLAN OF CARE: Admit to inpatient   PATIENT DISPOSITION:  ICU - intubated and hemodynamically stable.   Delay start of Pharmacological VTE agent (>24hrs) due to surgical blood loss or risk of bleeding: yes    Aortic Valve Etiology   Aortic Insufficiency:  Trivial/Trace  Aortic Valve Disease:  Yes.  Aortic Stenosis:  Yes. Smallest Aortic Valve Area: 0.82cm2; Highest Mean Gradient: 5mmHg.  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  Bioprosthetic Valve. Implant Model Number:3300 [id number], Size:25, Unique Device Identifier:[id number]  Repair/Reconstruction: No.   Aortic Annular Enlargement: No.

## 2015-08-23 ENCOUNTER — Inpatient Hospital Stay (HOSPITAL_COMMUNITY): Payer: Medicare Other

## 2015-08-23 LAB — GLUCOSE, CAPILLARY
Glucose-Capillary: 101 mg/dL — ABNORMAL HIGH (ref 65–99)
Glucose-Capillary: 102 mg/dL — ABNORMAL HIGH (ref 65–99)
Glucose-Capillary: 104 mg/dL — ABNORMAL HIGH (ref 65–99)
Glucose-Capillary: 105 mg/dL — ABNORMAL HIGH (ref 65–99)
Glucose-Capillary: 109 mg/dL — ABNORMAL HIGH (ref 65–99)
Glucose-Capillary: 122 mg/dL — ABNORMAL HIGH (ref 65–99)
Glucose-Capillary: 133 mg/dL — ABNORMAL HIGH (ref 65–99)
Glucose-Capillary: 137 mg/dL — ABNORMAL HIGH (ref 65–99)
Glucose-Capillary: 137 mg/dL — ABNORMAL HIGH (ref 65–99)
Glucose-Capillary: 143 mg/dL — ABNORMAL HIGH (ref 65–99)
Glucose-Capillary: 76 mg/dL (ref 65–99)
Glucose-Capillary: 90 mg/dL (ref 65–99)
Glucose-Capillary: 98 mg/dL (ref 65–99)

## 2015-08-23 LAB — MAGNESIUM
Magnesium: 2.6 mg/dL — ABNORMAL HIGH (ref 1.7–2.4)
Magnesium: 2.4 mg/dL (ref 1.7–2.4)

## 2015-08-23 LAB — CBC
HCT: 32 % — ABNORMAL LOW (ref 39.0–52.0)
HCT: 32.8 % — ABNORMAL LOW (ref 39.0–52.0)
Hemoglobin: 10.1 g/dL — ABNORMAL LOW (ref 13.0–17.0)
Hemoglobin: 10.5 g/dL — ABNORMAL LOW (ref 13.0–17.0)
MCH: 26.7 pg (ref 26.0–34.0)
MCH: 27.1 pg (ref 26.0–34.0)
MCHC: 31.6 g/dL (ref 30.0–36.0)
MCHC: 32 g/dL (ref 30.0–36.0)
MCV: 84.7 fL (ref 78.0–100.0)
MCV: 84.5 fL (ref 78.0–100.0)
Platelets: 221 10*3/uL (ref 150–400)
Platelets: 154 10*3/uL (ref 150–400)
RBC: 3.78 MIL/uL — ABNORMAL LOW (ref 4.22–5.81)
RBC: 3.88 MIL/uL — ABNORMAL LOW (ref 4.22–5.81)
RDW: 14.2 % (ref 11.5–15.5)
RDW: 14.6 % (ref 11.5–15.5)
WBC: 22.6 10*3/uL — ABNORMAL HIGH (ref 4.0–10.5)
WBC: 20.4 10*3/uL — ABNORMAL HIGH (ref 4.0–10.5)

## 2015-08-23 LAB — POCT I-STAT 3, ART BLOOD GAS (G3+)
Acid-base deficit: 5 mmol/L — ABNORMAL HIGH (ref 0.0–2.0)
Bicarbonate: 21 mEq/L (ref 20.0–24.0)
O2 Saturation: 94 %
Patient temperature: 37.1
TCO2: 22 mmol/L (ref 0–100)
pCO2 arterial: 41.3 mmHg (ref 35.0–45.0)
pH, Arterial: 7.315 — ABNORMAL LOW (ref 7.350–7.450)
pO2, Arterial: 77 mmHg — ABNORMAL LOW (ref 80.0–100.0)

## 2015-08-23 LAB — BASIC METABOLIC PANEL
Anion gap: 8 (ref 5–15)
BUN: 11 mg/dL (ref 6–20)
CO2: 23 mmol/L (ref 22–32)
Calcium: 7.9 mg/dL — ABNORMAL LOW (ref 8.9–10.3)
Chloride: 105 mmol/L (ref 101–111)
Creatinine, Ser: 1.39 mg/dL — ABNORMAL HIGH (ref 0.61–1.24)
GFR calc Af Amer: 59 mL/min — ABNORMAL LOW (ref >60)
GFR calc non Af Amer: 51 mL/min — ABNORMAL LOW (ref >60)
Glucose, Bld: 107 mg/dL — ABNORMAL HIGH (ref 65–99)
Potassium: 4.2 mmol/L (ref 3.5–5.1)
Sodium: 136 mmol/L (ref 135–145)

## 2015-08-23 LAB — POCT I-STAT, CHEM 8
BUN: 16 mg/dL (ref 6–20)
Calcium, Ion: 1.15 mmol/L (ref 1.13–1.30)
Chloride: 96 mmol/L — ABNORMAL LOW (ref 101–111)
Creatinine, Ser: 1.5 mg/dL — ABNORMAL HIGH (ref 0.61–1.24)
Glucose, Bld: 138 mg/dL — ABNORMAL HIGH (ref 65–99)
HCT: 36 % — ABNORMAL LOW (ref 39.0–52.0)
Hemoglobin: 12.2 g/dL — ABNORMAL LOW (ref 13.0–17.0)
Potassium: 4.6 mmol/L (ref 3.5–5.1)
Sodium: 133 mmol/L — ABNORMAL LOW (ref 135–145)
TCO2: 26 mmol/L (ref 0–100)

## 2015-08-23 LAB — PREPARE FRESH FROZEN PLASMA
Unit division: 0
Unit division: 0

## 2015-08-23 LAB — TYPE AND SCREEN
ABO/RH(D): O POS
Antibody Screen: NEGATIVE

## 2015-08-23 LAB — CREATININE, SERUM
Creatinine, Ser: 1.49 mg/dL — ABNORMAL HIGH (ref 0.61–1.24)
GFR calc Af Amer: 55 mL/min — ABNORMAL LOW (ref >60)
GFR calc non Af Amer: 47 mL/min — ABNORMAL LOW (ref >60)

## 2015-08-23 MED ORDER — LEVALBUTEROL HCL 1.25 MG/0.5ML IN NEBU
1.2500 mg | INHALATION_SOLUTION | Freq: Four times a day (QID) | RESPIRATORY_TRACT | Status: DC
Start: 2015-08-23 — End: 2015-08-23
  Administered 2015-08-23: 1.25 mg via RESPIRATORY_TRACT
  Filled 2015-08-23: qty 0.5

## 2015-08-23 MED ORDER — VANCOMYCIN HCL IN DEXTROSE 1-5 GM/200ML-% IV SOLN
1000.0000 mg | Freq: Once | INTRAVENOUS | Status: AC
  Administered 2015-08-23: 1000 mg via INTRAVENOUS
  Filled 2015-08-23: qty 200

## 2015-08-23 MED ORDER — FUROSEMIDE 10 MG/ML IJ SOLN
20.0000 mg | Freq: Two times a day (BID) | INTRAMUSCULAR | Status: DC
  Administered 2015-08-23 – 2015-08-24 (×3): 20 mg via INTRAVENOUS
  Filled 2015-08-23 (×2): qty 2

## 2015-08-23 MED ORDER — METOCLOPRAMIDE HCL 5 MG/ML IJ SOLN
10.0000 mg | Freq: Four times a day (QID) | INTRAMUSCULAR | Status: DC
  Administered 2015-08-23 – 2015-08-28 (×12): 10 mg via INTRAVENOUS
  Filled 2015-08-23 (×13): qty 2

## 2015-08-23 MED ORDER — HYDRALAZINE HCL 20 MG/ML IJ SOLN
10.0000 mg | INTRAMUSCULAR | Status: DC | PRN
  Administered 2015-08-24 (×2): 10 mg via INTRAVENOUS
  Filled 2015-08-23 (×3): qty 1

## 2015-08-23 MED ORDER — IRBESARTAN 75 MG PO TABS
37.5000 mg | ORAL_TABLET | Freq: Every day | ORAL | Status: DC
  Administered 2015-08-23 – 2015-08-24 (×2): 37.5 mg via ORAL
  Filled 2015-08-23 (×3): qty 0.5

## 2015-08-23 MED ORDER — LACTATED RINGERS IV SOLN
INTRAVENOUS | Status: DC
  Administered 2015-08-23: 09:00:00 via INTRAVENOUS

## 2015-08-23 MED ORDER — INSULIN DETEMIR 100 UNIT/ML ~~LOC~~ SOLN
12.0000 [IU] | Freq: Two times a day (BID) | SUBCUTANEOUS | Status: DC
  Administered 2015-08-23 – 2015-08-28 (×9): 12 [IU] via SUBCUTANEOUS
  Filled 2015-08-23 (×13): qty 0.12

## 2015-08-23 MED ORDER — IPRATROPIUM-ALBUTEROL 0.5-2.5 (3) MG/3ML IN SOLN: 3.0000 mL | RESPIRATORY_TRACT | Status: DC

## 2015-08-23 MED ORDER — LEVALBUTEROL HCL 1.25 MG/0.5ML IN NEBU
1.2500 mg | INHALATION_SOLUTION | Freq: Four times a day (QID) | RESPIRATORY_TRACT | Status: DC
  Administered 2015-08-23 – 2015-08-24 (×6): 1.25 mg via RESPIRATORY_TRACT
  Filled 2015-08-23 (×6): qty 0.5

## 2015-08-23 MED ORDER — IPRATROPIUM BROMIDE 0.02 % IN SOLN
0.5000 mg | RESPIRATORY_TRACT | Status: DC
  Administered 2015-08-23 (×4): 0.5 mg via RESPIRATORY_TRACT
  Filled 2015-08-23 (×4): qty 2.5

## 2015-08-23 MED ORDER — IPRATROPIUM BROMIDE 0.02 % IN SOLN
0.5000 mg | Freq: Four times a day (QID) | RESPIRATORY_TRACT | Status: DC
  Administered 2015-08-24 (×4): 0.5 mg via RESPIRATORY_TRACT
  Filled 2015-08-23 (×4): qty 2.5

## 2015-08-23 MED ORDER — METOCLOPRAMIDE HCL 5 MG/ML IJ SOLN: 10.0000 mg | Freq: Four times a day (QID) | INTRAMUSCULAR | Status: DC

## 2015-08-23 MED ORDER — CETYLPYRIDINIUM CHLORIDE 0.05 % MT LIQD
7.0000 mL | Freq: Two times a day (BID) | OROMUCOSAL | Status: DC
  Administered 2015-08-24 – 2015-08-27 (×5): 7 mL via OROMUCOSAL

## 2015-08-23 MED ORDER — INSULIN ASPART 100 UNIT/ML ~~LOC~~ SOLN
0.0000 [IU] | SUBCUTANEOUS | Status: DC
  Administered 2015-08-23 – 2015-08-24 (×5): 2 [IU] via SUBCUTANEOUS

## 2015-08-23 NOTE — Op Note (Signed)
NAME:  DELMO, [name] NO.:  MEDICAL RECORD NO.:  [id number]  LOCATION:                                 FACILITY:  PHYSICIAN:  Ivin Poot, M.D.  DATE OF BIRTH:  Jan 24, 1951  DATE OF PROCEDURE:  08/22/2015 DATE OF DISCHARGE:                              OPERATIVE REPORT   OPERATION: 1. Aortic valve replacement with a 25 mm pericardial valve [facility] Ease, serial [id number]). 2. Left atrial clipping. 3. Left atrial maze procedure.  SURGEON:  Ivin Poot, MD.  ASSISTANT:  Providence Crosby, PA-C.  PREOPERATIVE DIAGNOSIS:  Severe aortic stenosis, class IV CHF, history of atrial fibrillation.  POSTOPERATIVE DIAGNOSIS:  Severe aortic stenosis, class IV CHF, history of atrial fibrillation.  ANESTHESIA:  General.  INDICATIONS:  The patient is an obese 65 year old hypertensive male with symptoms of progressive shortness of breath, exercise intolerance, orthopnea, PND, and chest discomfort.  Echocardiogram demonstrated a severely stenotic aortic valve, probably bicuspid with LVH.  LV systolic function was preserved.  Cardiac catheterization showed no significant coronary artery disease.  He was recommended for aortic valve replacement.  The patient had previously undergone AFib ablation treatment followed by a pacemaker placement.  He has been in atrial fibrillation, on Pradaxa for the last 2-3 years.  After reviewing the patient's echo and cardiac films, discussing situation with him in detail, I recommended that the patient have an aortic valve replacement with a pericardial tissue valve, combined with a left atrial maze procedure and left atrial clipping for his atrial fibrillation.  I discussed the procedure of AVR, maze procedure, and left atrial clipping including the use of general anesthesia and cardiopulmonary bypass, the location of the surgical incision, and expected postoperative hospital recovery.  I reviewed with the patient  the risks to him of the operation including risks of MI, stroke, bleeding, blood transfusion requirement, infection, persistent atrial fibrillation, and death.  He demonstrated his understanding and agreed to proceed with surgery under what I felt was an informed consent.  OPERATIVE FINDINGS: 1. Bicuspid severely calcified stenotic aortic valve. 2. Successful conversion to sinus rhythm after a left atrial maze     procedure.  OPERATIVE PROCEDURE:  The patient was brought to the operating room and placed supine on the operating table.  General anesthesia was induced under invasive hemodynamic monitoring.  The chest, abdomen, and legs were prepped with Betadine and draped as a sterile field.  A transesophageal echo probe was placed by the anesthesiologist.  The echo confirmed the preoperative diagnosis of severe aortic stenosis.  A proper time-out was performed.  A sternal incision was made.  The sternum was retracted, and the pericardium was opened.  The ascending aorta was examined and palpated and had minimal calcification and was mildly enlarged at less than 4 cm.  Heparin was administered.  Pursestrings were placed in the ascending aorta and right atrium.  When the ACT was documented as being therapeutic, the patient was cannulated and placed on cardiopulmonary bypass.  The left atrial ablation lesions around the superior and inferior right-sided pulmonary veins were then created using the AtriCure radiofrequency ablation clamp.  Three ablation lines were placed around each vein.  At that point, a left ventricular vent was placed via the right superior pulmonary vein and passed through the mitral valve into the LV. Cardioplegia cannulas were placed both antegrade and retrograde for cold blood cardioplegia.  The patient was cooled to 32 degrees.  The aortic crossclamp was applied, and a liter of cold blood cardioplegia was delivered in split doses between the antegrade aortic and  retrograde coronary sinus catheters.  There was good cardioplegic arrest, and septal temperature dropped less than 14 degrees.  Cardioplegia was delivered every 20 minutes with a crossclamp was in place.  The heart was then retracted to expose the left-sided pulmonary veins. A vessel loop was placed around the confluence of the left-sided pulmonary veins, and the AtriCure radiofrequency ablation clamp was placed on the atrial cuff with the junction with the pulmonary veins, and three ablation lines were placed.  Next, two ablation lines were placed at the base of the left atrial appendage, followed by placement of a left atrial clip measuring 40 mm in length.  The heart was then put back in its normal position and cardioplegia was redosed.  A transverse aortotomy was performed.  The aortic valve was inspected. CO2 was insufflated into the operative field.  The valve was excised. It was heavily calcified.  The annulus was heavily calcified, and the calcium was debrided.  The outflow tract was irrigated with copious amounts of cold saline.  The annulus was sized to a 25 mm valve.  A 2-0 pledgeted Ethibond sutures were placed around the annulus, numbering in 16 total.  The valve was prepared according to protocol.  The sutures were placed through the sewing ring of the valve.  The valve seated, and the sutures were all tied.  There was good confirmation of the valve to the annulus without evidence of spaces for perivalvular leak.  The ostium of the coronary vessels were widely patent.  The aortotomy was then closed with two layers of running 4-0 Prolene.  A dose of retrograde warm blood cardioplegia was then given with the usual de-airing maneuvers, with the head in steep Trendelenburg position.  The cross-clamp was then removed.  Heart was cardioverted back to a regular rhythm.  The aortotomy and ablation lines were checked and found to be hemostatic.  The patient was rewarmed and  reperfused.  Temporary pacing wires were applied.  The lungs were expanded.  The LV vent was removed.  The cardioplegia cannulas were removed.  The patient was then weaned off cardiopulmonary bypass on low-dose Milrinone with good systolic function and normal hemodynamics and normal cardiac output.  Protamine was administered without adverse reaction.  The TEE showed normal function of the new aortic valve.  There was minimal gradient and no significant AI.  The cannulas were removed after the protamine was administered full dose without adverse reaction.  The superior pericardial fat was closed over the aorta.  Anterior mediastinal and posterior mediastinal chest tubes were placed and brought out through separate incisions.  The patient remained stable. The sternum was closed with interrupted steel wire.  The pectoralis fascia was closed with a running #1 Vicryl.  The subcutaneous and skin layers were closed in running Vicryl, and sterile dressings were applied.  Total cardiopulmonary bypass time was 140 minutes.     Ivin Poot, M.D.     PV/MEDQ  D:  08/22/2015  T:  08/22/2015  Job:  [id number]

## 2015-08-23 NOTE — Progress Notes (Signed)
Patient ID: [name], male   DOB: February 16, 1951, 65 y.o.   MRN: [id number]   SICU Evening Rounds:   Hemodynamically stable, internally paced On milrinone 0.125 and amio drip  Urine output good  CT output low  CBC    Component Value Date/Time   WBC 20.4* 08/23/2015 1701   RBC 3.88* 08/23/2015 1701   HGB 10.5* 08/23/2015 1701   HCT 32.8* 08/23/2015 1701   PLT 154 08/23/2015 1701   MCV 84.5 08/23/2015 1701   MCH 27.1 08/23/2015 1701   MCHC 32.0 08/23/2015 1701   RDW 14.6 08/23/2015 1701   LYMPHSABS 1248 08/03/2015 1036   MONOABS 702 08/03/2015 1036   EOSABS 156 08/03/2015 1036   BASOSABS 0 08/03/2015 1036     BMET    Component Value Date/Time   NA 133* 08/23/2015 1659   K 4.6 08/23/2015 1659   CL 96* 08/23/2015 1659   CO2 23 08/23/2015 0434   GLUCOSE 138* 08/23/2015 1659   BUN 16 08/23/2015 1659   CREATININE 1.49* 08/23/2015 1701   CREATININE 1.63* 08/03/2015 1036   CALCIUM 7.9* 08/23/2015 0434   GFRNONAA 47* 08/23/2015 1701   GFRAA 55* 08/23/2015 1701     A/P:  Stable postop course. Continue current plans

## 2015-08-23 NOTE — Progress Notes (Signed)
1 Day Post-Op Procedure(s) (LRB): AORTIC VALVE REPLACEMENT (AVR) (N/A) MAZE (N/A) CLIPPING OF ATRIAL APPENDAGE (N/A) TRANSESOPHAGEAL ECHOCARDIOGRAM (TEE) (N/A) Subjective: nsr Neuro intact On nipride for BP control  Objective: Vital signs in last 24 hours: Temp:  [96.3 F (35.7 C)-99.7 F (37.6 C)] 98.2 F (36.8 C) (04/26 0830) Pulse Rate:  [72-92] 81 (04/26 0830) Cardiac Rhythm:  [-] Ventricular paced (04/26 0740) Resp:  [7-22] 16 (04/25 1926) BP: (104-158)/(47-91) 123/57 mmHg (04/26 0830) SpO2:  [98 %-100 %] 99 % (04/26 0830) Arterial Line BP: (126-197)/(44-81) 187/66 mmHg (04/26 0830) FiO2 (%):  [40 %-50 %] 40 % (04/25 2040) Weight:  [220 lb 14.4 oz (100.2 kg)] 220 lb 14.4 oz (100.2 kg) (04/26 0611)  Hemodynamic parameters for last 24 hours: PAP: (32-80)/(13-37) 74/28 mmHg CO:  [3.1 L/min-6.7 L/min] 4.5 L/min CI:  [1.4 L/min/m2-3.1 L/min/m2] 2.1 L/min/m2  Intake/Output from previous day: 04/25 0701 - 04/26 0700 In: 6367.8 [I.V.:4773.8; [id number]; IV [id number] Out: [id number] [Urine:3263; Blood:1625; Chest Tube:360] Intake/Output this shift: Total I/O In: 121.8 [I.V.:71.8; IV Piggyback:50] Out: 60 [Urine:60]       Exam    General- alert and comfortable   Lungs- clear without rales, wheezes   Cor- regular rate and rhythm, no murmur , gallop   Abdomen- soft, non-tender   Extremities - warm, non-tender, minimal edema   Neuro- oriented, appropriate, no focal weakness   Lab Results:  Recent Labs  08/22/15 1915 08/22/15 1916 08/23/15 0434  WBC 16.4*  --  22.6*  HGB 10.0* 9.9* 10.1*  HCT 31.2* 29.0* 32.0*  PLT 170  --  221   BMET:  Recent Labs  08/22/15 1916 08/23/15 0434  NA 134* 136  K 4.9 4.2  CL 104 105  CO2  --  23  GLUCOSE 162* 107*  BUN 13 11  CREATININE 1.10 1.39*  CALCIUM  --  7.9*    PT/INR:  Recent Labs  08/22/15 1300  LABPROT 20.3*  INR 1.74*   ABG    Component Value Date/Time   PHART 7.315* 08/23/2015 0023   HCO3 21.0  08/23/2015 0023   TCO2 22 08/23/2015 0023   ACIDBASEDEF 5.0* 08/23/2015 0023   O2SAT 94.0 08/23/2015 0023   CBG (last 3)   Recent Labs  08/23/15 0406 08/23/15 0513 08/23/15 0612  GLUCAP 104* 90 101*    Assessment/Plan: S/P Procedure(s) (LRB): AORTIC VALVE REPLACEMENT (AVR) (N/A) MAZE (N/A) CLIPPING OF ATRIAL APPENDAGE (N/A) TRANSESOPHAGEAL ECHOCARDIOGRAM (TEE) (N/A) Follow elevated WBC Progression orders Diuresis Cont iv amiodarone today   LOS: 1 day    Tharon Aquas Trigt III 08/23/2015

## 2015-08-24 ENCOUNTER — Inpatient Hospital Stay (HOSPITAL_COMMUNITY): Payer: Medicare Other

## 2015-08-24 ENCOUNTER — Other Ambulatory Visit: Payer: Self-pay

## 2015-08-24 ENCOUNTER — Encounter (HOSPITAL_COMMUNITY): Payer: Self-pay | Admitting: Cardiothoracic Surgery

## 2015-08-24 LAB — GLUCOSE, CAPILLARY
Glucose-Capillary: 104 mg/dL — ABNORMAL HIGH (ref 65–99)
Glucose-Capillary: 119 mg/dL — ABNORMAL HIGH (ref 65–99)
Glucose-Capillary: 121 mg/dL — ABNORMAL HIGH (ref 65–99)
Glucose-Capillary: 122 mg/dL — ABNORMAL HIGH (ref 65–99)
Glucose-Capillary: 123 mg/dL — ABNORMAL HIGH (ref 65–99)
Glucose-Capillary: 128 mg/dL — ABNORMAL HIGH (ref 65–99)
Glucose-Capillary: 129 mg/dL — ABNORMAL HIGH (ref 65–99)

## 2015-08-24 LAB — CBC
HCT: 32.6 % — ABNORMAL LOW (ref 39.0–52.0)
Hemoglobin: 10.6 g/dL — ABNORMAL LOW (ref 13.0–17.0)
MCH: 27.4 pg (ref 26.0–34.0)
MCHC: 32.5 g/dL (ref 30.0–36.0)
MCV: 84.2 fL (ref 78.0–100.0)
Platelets: 156 10*3/uL (ref 150–400)
RBC: 3.87 MIL/uL — ABNORMAL LOW (ref 4.22–5.81)
RDW: 14.8 % (ref 11.5–15.5)
WBC: 20.2 10*3/uL — ABNORMAL HIGH (ref 4.0–10.5)

## 2015-08-24 LAB — BASIC METABOLIC PANEL
Anion gap: 9 (ref 5–15)
BUN: 15 mg/dL (ref 6–20)
CO2: 26 mmol/L (ref 22–32)
Calcium: 8.3 mg/dL — ABNORMAL LOW (ref 8.9–10.3)
Chloride: 98 mmol/L — ABNORMAL LOW (ref 101–111)
Creatinine, Ser: 1.37 mg/dL — ABNORMAL HIGH (ref 0.61–1.24)
GFR calc Af Amer: >60 mL/min (ref >60)
GFR calc non Af Amer: 52 mL/min — ABNORMAL LOW (ref >60)
Glucose, Bld: 126 mg/dL — ABNORMAL HIGH (ref 65–99)
Potassium: 4.2 mmol/L (ref 3.5–5.1)
Sodium: 133 mmol/L — ABNORMAL LOW (ref 135–145)

## 2015-08-24 MED ORDER — SODIUM CHLORIDE 0.9 % IV SOLN: 250.0000 mL | INTRAVENOUS | Status: DC | PRN

## 2015-08-24 MED ORDER — SODIUM CHLORIDE 0.9% FLUSH
3.0000 mL | Freq: Two times a day (BID) | INTRAVENOUS | Status: DC
  Administered 2015-08-24 – 2015-08-28 (×4): 3 mL via INTRAVENOUS

## 2015-08-24 MED ORDER — FUROSEMIDE 10 MG/ML IJ SOLN
40.0000 mg | Freq: Two times a day (BID) | INTRAMUSCULAR | Status: DC
  Administered 2015-08-24: 40 mg via INTRAVENOUS

## 2015-08-24 MED ORDER — SIMETHICONE 80 MG PO CHEW
80.0000 mg | CHEWABLE_TABLET | Freq: Four times a day (QID) | ORAL | Status: DC
  Administered 2015-08-24 – 2015-08-28 (×14): 80 mg via ORAL
  Filled 2015-08-24 (×14): qty 1

## 2015-08-24 MED ORDER — FUROSEMIDE 10 MG/ML IJ SOLN: 40.0000 mg | Freq: Once | INTRAMUSCULAR | Status: DC

## 2015-08-24 MED ORDER — DM-GUAIFENESIN ER 30-600 MG PO TB12
1.0000 | ORAL_TABLET | Freq: Two times a day (BID) | ORAL | Status: DC
  Administered 2015-08-24 – 2015-08-28 (×8): 1 via ORAL
  Filled 2015-08-24 (×10): qty 1

## 2015-08-24 MED ORDER — MOVING RIGHT ALONG BOOK
Freq: Once | Status: AC
  Administered 2015-08-24: 08:00:00
  Filled 2015-08-24: qty 1

## 2015-08-24 MED ORDER — INSULIN ASPART 100 UNIT/ML ~~LOC~~ SOLN
0.0000 [IU] | Freq: Three times a day (TID) | SUBCUTANEOUS | Status: DC
  Administered 2015-08-24 – 2015-08-25 (×2): 2 [IU] via SUBCUTANEOUS
  Administered 2015-08-28: 4 [IU] via SUBCUTANEOUS

## 2015-08-24 MED ORDER — METOPROLOL TARTRATE 25 MG PO TABS
25.0000 mg | ORAL_TABLET | Freq: Two times a day (BID) | ORAL | Status: DC
  Administered 2015-08-24 – 2015-08-28 (×8): 25 mg via ORAL
  Filled 2015-08-24 (×9): qty 1

## 2015-08-24 MED ORDER — MAGNESIUM HYDROXIDE 400 MG/5ML PO SUSP: 30.0000 mL | Freq: Every day | ORAL | Status: DC | PRN

## 2015-08-24 MED ORDER — ENOXAPARIN SODIUM 40 MG/0.4ML ~~LOC~~ SOLN
40.0000 mg | SUBCUTANEOUS | Status: DC
  Administered 2015-08-26 – 2015-08-28 (×3): 40 mg via SUBCUTANEOUS
  Filled 2015-08-24 (×4): qty 0.4

## 2015-08-24 MED ORDER — AMLODIPINE BESYLATE 5 MG PO TABS
5.0000 mg | ORAL_TABLET | Freq: Every day | ORAL | Status: DC
  Administered 2015-08-24 – 2015-08-26 (×2): 5 mg via ORAL
  Filled 2015-08-24 (×3): qty 1

## 2015-08-24 MED ORDER — SODIUM CHLORIDE 0.9% FLUSH: 3.0000 mL | INTRAVENOUS | Status: DC | PRN

## 2015-08-24 MED ORDER — FUROSEMIDE 10 MG/ML IJ SOLN
40.0000 mg | Freq: Every day | INTRAMUSCULAR | Status: DC
  Administered 2015-08-26: 40 mg via INTRAVENOUS
  Filled 2015-08-24 (×2): qty 4

## 2015-08-24 MED ORDER — AMIODARONE HCL 200 MG PO TABS
200.0000 mg | ORAL_TABLET | Freq: Two times a day (BID) | ORAL | Status: DC
  Administered 2015-08-24 – 2015-08-28 (×8): 200 mg via ORAL
  Filled 2015-08-24 (×9): qty 1

## 2015-08-24 NOTE — Progress Notes (Signed)
Transferred to 2W20 via wheelchair. Portable monitor and oxygen on. No changes.

## 2015-08-24 NOTE — Progress Notes (Signed)
Pt transferred to unit from 2-south. Pt placed on tele. Vitals stable. Pt states pain is 5/10, but does not currently want anything for it. Call bell and phone within reach. Will continue to monitor.

## 2015-08-24 NOTE — Progress Notes (Signed)
2 Days Post-Op Procedure(s) (LRB): AORTIC VALVE REPLACEMENT (AVR) (N/A) MAZE (N/A) CLIPPING OF ATRIAL APPENDAGE (N/A) TRANSESOPHAGEAL ECHOCARDIOGRAM (TEE) (N/A) Subjective: AVR-Maze Postop ileus, htn Ready for tx to stepdown Needs more diuresis Resume preop BP meds  Objective: Vital signs in last 24 hours: Temp:  [97.8 F (36.6 C)-98.6 F (37 C)] 98.6 F (37 C) (04/27 0409) Pulse Rate:  [70-89] 80 (04/27 0800) Cardiac Rhythm:  [-] Ventricular paced (04/27 0600) Resp:  [18-20] 18 (04/26 1652) BP: (82-186)/(56-99) 157/66 mmHg (04/27 0800) SpO2:  [88 %-100 %] 96 % (04/27 0800) Arterial Line BP: (138-219)/(59-132) 216/132 mmHg (04/26 1400) Weight:  [222 lb 7.1 oz (100.9 kg)] 222 lb 7.1 oz (100.9 kg) (04/27 0500)  Hemodynamic parameters for last 24 hours: PAP: (74)/(28) 74/28 mmHg  Intake/Output from previous day: 04/26 0701 - 04/27 0700 In: 1655.8 [P.O.:420; I.V.:885.8; IV Piggyback:350] Out: 1310 [Urine:885; Chest Tube:425] Intake/Output this shift: Total I/O In: 255.7 [I.V.:255.7] Out: 75 [Urine:75]  abd non tender but distended nsr Lab Results:  Recent Labs  08/23/15 1701 08/24/15 0405  WBC 20.4* 20.2*  HGB 10.5* 10.6*  HCT 32.8* 32.6*  PLT 154 156   BMET:  Recent Labs  08/23/15 0434 08/23/15 1659 08/23/15 1701 08/24/15 0405  NA 136 133*  --  133*  K 4.2 4.6  --  4.2  CL 105 96*  --  98*  CO2 23  --   --  26  GLUCOSE 107* 138*  --  126*  BUN 11 16  --  15  CREATININE 1.39* 1.50* 1.49* 1.37*  CALCIUM 7.9*  --   --  8.3*    PT/INR:  Recent Labs  08/22/15 1300  LABPROT 20.3*  INR 1.74*   ABG    Component Value Date/Time   PHART 7.315* 08/23/2015 0023   HCO3 21.0 08/23/2015 0023   TCO2 26 08/23/2015 1659   ACIDBASEDEF 5.0* 08/23/2015 0023   O2SAT 94.0 08/23/2015 0023   CBG (last 3)   Recent Labs  08/23/15 1922 08/23/15 2357 08/24/15 0358  GLUCAP 137* 122* 119*    Assessment/Plan: S/P Procedure(s) (LRB): AORTIC VALVE REPLACEMENT  (AVR) (N/A) MAZE (N/A) CLIPPING OF ATRIAL APPENDAGE (N/A) TRANSESOPHAGEAL ECHOCARDIOGRAM (TEE) (N/A) Mobilize Diuresis Plan for transfer to step-down: see transfer orders No coumadin  LOS: 2 days    [name] 08/24/2015

## 2015-08-25 ENCOUNTER — Inpatient Hospital Stay (HOSPITAL_COMMUNITY): Payer: Medicare Other

## 2015-08-25 LAB — GLUCOSE, CAPILLARY
Glucose-Capillary: 118 mg/dL — ABNORMAL HIGH (ref 65–99)
Glucose-Capillary: 102 mg/dL — ABNORMAL HIGH (ref 65–99)
Glucose-Capillary: 98 mg/dL (ref 65–99)
Glucose-Capillary: 148 mg/dL — ABNORMAL HIGH (ref 65–99)

## 2015-08-25 LAB — PROTIME-INR
INR: 1.41 (ref 0.00–1.49)
Prothrombin Time: 17.4 seconds — ABNORMAL HIGH (ref 11.6–15.2)

## 2015-08-25 LAB — BASIC METABOLIC PANEL
Anion gap: 10 (ref 5–15)
BUN: 24 mg/dL — ABNORMAL HIGH (ref 6–20)
CO2: 28 mmol/L (ref 22–32)
Calcium: 8.7 mg/dL — ABNORMAL LOW (ref 8.9–10.3)
Chloride: 96 mmol/L — ABNORMAL LOW (ref 101–111)
Creatinine, Ser: 1.41 mg/dL — ABNORMAL HIGH (ref 0.61–1.24)
GFR calc Af Amer: 58 mL/min — ABNORMAL LOW (ref >60)
GFR calc non Af Amer: 50 mL/min — ABNORMAL LOW (ref >60)
Glucose, Bld: 111 mg/dL — ABNORMAL HIGH (ref 65–99)
Potassium: 4.7 mmol/L (ref 3.5–5.1)
Sodium: 134 mmol/L — ABNORMAL LOW (ref 135–145)

## 2015-08-25 LAB — CBC
HCT: 33.2 % — ABNORMAL LOW (ref 39.0–52.0)
Hemoglobin: 10.5 g/dL — ABNORMAL LOW (ref 13.0–17.0)
MCH: 27.1 pg (ref 26.0–34.0)
MCHC: 31.6 g/dL (ref 30.0–36.0)
MCV: 85.6 fL (ref 78.0–100.0)
Platelets: 179 10*3/uL (ref 150–400)
RBC: 3.88 MIL/uL — ABNORMAL LOW (ref 4.22–5.81)
RDW: 15.1 % (ref 11.5–15.5)
WBC: 15.9 10*3/uL — ABNORMAL HIGH (ref 4.0–10.5)

## 2015-08-25 MED ORDER — LEVALBUTEROL HCL 1.25 MG/0.5ML IN NEBU
1.2500 mg | INHALATION_SOLUTION | Freq: Three times a day (TID) | RESPIRATORY_TRACT | Status: DC
  Administered 2015-08-25 (×2): 1.25 mg via RESPIRATORY_TRACT
  Filled 2015-08-25 (×2): qty 0.5

## 2015-08-25 MED ORDER — IPRATROPIUM BROMIDE 0.02 % IN SOLN
0.5000 mg | Freq: Two times a day (BID) | RESPIRATORY_TRACT | Status: DC
  Administered 2015-08-25 – 2015-08-28 (×6): 0.5 mg via RESPIRATORY_TRACT
  Filled 2015-08-25 (×6): qty 2.5

## 2015-08-25 MED ORDER — IRBESARTAN 150 MG PO TABS
75.0000 mg | ORAL_TABLET | Freq: Every day | ORAL | Status: DC
  Administered 2015-08-26: 75 mg via ORAL
  Filled 2015-08-25 (×2): qty 1

## 2015-08-25 MED ORDER — IPRATROPIUM BROMIDE 0.02 % IN SOLN
0.5000 mg | Freq: Three times a day (TID) | RESPIRATORY_TRACT | Status: DC
Start: 2015-08-25 — End: 2015-08-25
  Administered 2015-08-25 (×2): 0.5 mg via RESPIRATORY_TRACT
  Filled 2015-08-25 (×2): qty 2.5

## 2015-08-25 MED ORDER — BISACODYL 10 MG RE SUPP: 10.0000 mg | Freq: Once | RECTAL | Status: DC

## 2015-08-25 MED ORDER — LEVALBUTEROL HCL 0.63 MG/3ML IN NEBU: 0.6300 mg | INHALATION_SOLUTION | Freq: Four times a day (QID) | RESPIRATORY_TRACT | Status: DC | PRN

## 2015-08-25 MED ORDER — WARFARIN - PHYSICIAN DOSING INPATIENT: Freq: Every day | Status: DC

## 2015-08-25 MED ORDER — LEVALBUTEROL HCL 1.25 MG/0.5ML IN NEBU
1.2500 mg | INHALATION_SOLUTION | Freq: Two times a day (BID) | RESPIRATORY_TRACT | Status: DC
  Administered 2015-08-25 – 2015-08-28 (×6): 1.25 mg via RESPIRATORY_TRACT
  Filled 2015-08-25 (×6): qty 0.5

## 2015-08-25 MED ORDER — WARFARIN SODIUM 2 MG PO TABS: 2.0000 mg | ORAL_TABLET | Freq: Every day | ORAL | Status: DC

## 2015-08-25 NOTE — Progress Notes (Signed)
CARDIAC REHAB PHASE I   PRE:  Rate/Rhythm: 70 paced  BP:  Sitting: 158/85        SaO2: 92 2L  MODE:  Ambulation: 350 ft   POST:  Rate/Rhythm: 70 paced  BP:  Sitting: 197/78, 180/81 after 2 minutes rest          SaO2: 95 2L  Pt up in recliner, states he hasn't walked since surgery. Pt ambulated 350 ft on 2L O2, rolling walker, steady gait, tolerated well. Pt c/o mild DOE, brief standing rest x1, sats 95% on 2L O2 during ambulation. Pt BP elevated upon return to room, improved with recheck after 2 minutes rest, but still high. Encouraged IS, flutter valve, additional ambulation x2 today, pt verbalized understanding. Discussed phase 2 cardiac rehab. Pt agrees to phase 2 cardiac rehab referral, will send to [facility] per pt request. Pt to recliner after walk, call bell within reach. Will follow.   [id number] Lenna Sciara, RN, BSN 08/25/2015 10:37 AM

## 2015-08-25 NOTE — Progress Notes (Addendum)
[address]       [place] [address]             [phone]      3 Days Post-Op Procedure(s) (LRB): AORTIC VALVE REPLACEMENT (AVR) (N/A) MAZE (N/A) CLIPPING OF ATRIAL APPENDAGE (N/A) TRANSESOPHAGEAL ECHOCARDIOGRAM (TEE) (N/A) Subjective: Feels fair  Objective: Vital signs in last 24 hours: Temp:  [97.5 F (36.4 C)-98.2 F (36.8 C)] 97.5 F (36.4 C) (04/28 0332) Pulse Rate:  [70-85] 76 (04/28 0719) Cardiac Rhythm:  [-] Ventricular paced;Bundle branch block (04/27 1951) Resp:  [18] 18 (04/28 0719) BP: (139-176)/(66-81) 152/68 mmHg (04/28 0332) SpO2:  [88 %-100 %] 88 % (04/28 0719) FiO2 (%):  [0 %] 0 % (04/27 0814) Weight:  [217 lb 8 oz (98.657 kg)] 217 lb 8 oz (98.657 kg) (04/28 0332)  Hemodynamic parameters for last 24 hours:    Intake/Output from previous day: 04/27 0701 - 04/28 0700 In: 445.8 [P.O.:140; I.V.:305.8] Out: 900 [Urine:900] Intake/Output this shift:    General appearance: alert, cooperative and no distress Heart: regular rate and rhythm and no murmur, paced rhythm Lungs: mildly dim in bases Abdomen: Mod distension, hernia is easily reduced, no BS Extremities: no edema Wound: dressing CDI  Lab Results:  Recent Labs  08/24/15 0405 08/25/15 0434  WBC 20.2* 15.9*  HGB 10.6* 10.5*  HCT 32.6* 33.2*  PLT 156 179   BMET:  Recent Labs  08/24/15 0405 08/25/15 0434  NA 133* 134*  K 4.2 4.7  CL 98* 96*  CO2 26 28  GLUCOSE 126* 111*  BUN 15 24*  CREATININE 1.37* 1.41*  CALCIUM 8.3* 8.7*    PT/INR:  Recent Labs  08/22/15 1300  LABPROT 20.3*  INR 1.74*   ABG    Component Value Date/Time   PHART 7.315* 08/23/2015 0023   HCO3 21.0 08/23/2015 0023   TCO2 26 08/23/2015 1659   ACIDBASEDEF 5.0* 08/23/2015 0023   O2SAT 94.0 08/23/2015 0023   CBG (last 3)   Recent Labs  08/24/15 2057 08/24/15 2248 08/25/15 0632  GLUCAP 128* 129* 118*    Meds Scheduled Meds: . acetaminophen  1,000 mg Oral Q6H   Or  .  acetaminophen (TYLENOL) oral liquid 160 mg/5 mL  1,000 mg Per Tube Q6H  . amiodarone  200 mg Oral BID  . amLODipine  5 mg Oral Daily  . antiseptic oral rinse  7 mL Mouth Rinse BID  . aspirin EC  325 mg Oral Daily   Or  . aspirin  324 mg Per Tube Daily  . atorvastatin  40 mg Oral Daily  . bisacodyl  10 mg Oral Daily   Or  . bisacodyl  10 mg Rectal Daily  . chlorhexidine  30 mL Topical UD  . dextromethorphan-guaiFENesin  1 tablet Oral BID  . docusate sodium  200 mg Oral Daily  . enoxaparin (LOVENOX) injection  40 mg Subcutaneous Q24H  . furosemide  40 mg Intravenous Once  . furosemide  40 mg Intravenous Daily  . insulin aspart  0-24 Units Subcutaneous TID AC & HS  . insulin detemir  12 Units Subcutaneous BID  . ipratropium  0.5 mg Nebulization TID  . irbesartan  75 mg Oral Daily  . levalbuterol  1.25 mg Nebulization TID  . levothyroxine  175 mcg Oral QAC breakfast  . loratadine  10 mg Oral Daily  . metoCLOPramide (REGLAN) injection  10 mg Intravenous Q6H  . metoprolol tartrate  25 mg Oral BID  .  pantoprazole  40 mg Oral Daily  . rOPINIRole  1 mg Oral Daily  . simethicone  80 mg Oral QID  . sodium chloride flush  3 mL Intravenous Q12H  . sodium chloride flush  3 mL Intravenous Q12H   Continuous Infusions: . sodium chloride Stopped (08/23/15 0941)  . sodium chloride 250 mL (08/24/15 0800)  . sodium chloride Stopped (08/23/15 0941)  . lactated ringers Stopped (08/22/15 1330)  . nitroGLYCERIN Stopped (08/22/15 1805)   PRN Meds:.sodium chloride, sodium chloride, fluticasone, hydrALAZINE, levalbuterol, magnesium hydroxide, metoprolol, ondansetron (ZOFRAN) IV, oxyCODONE, sodium chloride flush, sodium chloride flush, traMADol  Xrays Dg Chest 2 View  08/25/2015  CLINICAL DATA:  Status post aortic valve replacement. Right apical pneumothorax. EXAM: CHEST  2 VIEW COMPARISON:  Chest x-rays dated 08/24/2015, 08/23/2015 and 08/18/2015 FINDINGS: The small right pneumothorax is slightly more  prominent with a small right hydro pneumothorax component at the right base laterally which appears to be new. Slight cardiomegaly. Pulmonary vascularity is normal. New slight atelectasis at the left lung base. Small bilateral pleural effusions. Aortic valve replacement, left atrial appendage clip, and pacemaker are in place. IMPRESSION: Slight increase in small right hydro pneumothorax. Small bilateral pleural effusions. New slight atelectasis at the left lung apex. Electronically Signed   By: Lorriane Shire M.D.   On: 08/25/2015 07:33   Dg Chest Port 1 View  08/24/2015  CLINICAL DATA:  Aortic valve replacement. EXAM: PORTABLE CHEST 1 VIEW COMPARISON:  08/23/2015. FINDINGS: Interim removal Swan-Ganz catheter. Left IJ sheath in stable position. Cardiac pacer in stable position. Prior cardiac valve replacement. Left atrial appendage clip noted. Cardiomegaly with mild bilateral pulmonary interstitial prominence. Right apical pneumothorax has improved. Mild residual. No pleural effusion. IMPRESSION: 1. Right apical pneumothorax has improved.  Mild residual. 2. Cardiac pacer noted in stable position . Prior cardiac valve replacement. Left atrial appendage clip noted. Stable cardiomegaly. Stable mild interstitial prominence again noted. A mild component congestive heart failure cannot be excluded . Electronically Signed   By: Marcello Moores  Register   On: 08/24/2015 07:45    Assessment/Plan: S/P Procedure(s) (LRB): AORTIC VALVE REPLACEMENT (AVR) (N/A) MAZE (N/A) CLIPPING OF ATRIAL APPENDAGE (N/A) TRANSESOPHAGEAL ECHOCARDIOGRAM (TEE) (N/A)  1 doing well 2 creat fairly stable 3 leukocytosis improved 4 + ileus persists - increase ambulation as able- slow with diet 5 H/H stable 6 cbg's stable  LOS: 3 days    GOLD,WAYNE E 08/25/2015  Cont diuresis dulc suppos Keep EPWs today nsr- resume pradaxa for 30days at discharge patient examined and medical record reviewed,agree with above note. Tharon Aquas Trigt  III 08/25/2015

## 2015-08-25 NOTE — Care Management Important Message (Signed)
Important Message  Patient Details  Name: [name] MRN: [id number] Date of Birth: [birth year]   Medicare Important Message Given:  Yes    Dawayne Patricia, RN 08/25/2015, 1:04 PM

## 2015-08-25 NOTE — Progress Notes (Signed)
Utilization review completed.  

## 2015-08-25 NOTE — Progress Notes (Addendum)
Physician Discharge Summary  [name] ID: [name] MRN: [id number] DOB/AGE: July 28, 1950 65 y.o.  Admit date: 08/22/2015 Discharge date: 08/28/2015  Admission Diagnoses:  [name] Active Problem List   Diagnosis Date Noted  . GERD (gastroesophageal reflux disease) 09/20/2014  . 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 Dyspnea 09/15/2013  . Pulmonary HTN ([place]) 09/15/2013  . Shortness of breath 05/05/2013  . 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 Bradycardia 03/31/2012  . Second degree AV block 03/31/2012  . AV block, 1st degree 02/01/2012  . Atrial fibrillation ([place]) 11/13/2011  . Fatigue 11/13/2011  . Hypertension 11/13/2011  . Aortic stenosis 11/13/2011  . Sleep apnea 11/13/2011   Discharge Diagnoses:   [name] Active Problem List   Diagnosis Date Noted  . S/P AVR 08/22/2015  . GERD (gastroesophageal reflux disease) 09/20/2014  . 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 Dyspnea 09/15/2013  . Pulmonary HTN ([place]) 09/15/2013  . Shortness of breath 05/05/2013  . 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 Bradycardia 03/31/2012  . Second degree AV block 03/31/2012  . AV block, 1st degree 02/01/2012  . Atrial fibrillation ([place]) 11/13/2011  . Fatigue 11/13/2011  . Hypertension 11/13/2011  . Aortic stenosis 11/13/2011  . Sleep apnea 11/13/2011   Discharged Condition: good  History of Present Illness:  [name] is a 65 yo male who presented to [facility] with diagnosis of Severe Aortic Stenosis and Chronic Atrial Fibrillation on Pradaxa.  He has complaints of shortness of breath on exertion and restricted exercise tolerance.  He has no symptoms at rest and denies chest pain.  Coronary catheterization was performed and showed no CAD.  He was evaluated by Dr. Prescott [name] on 08/16/2015 at which time he was in agreement [name] would benefit from Aortic Valve Replacement.  It was also felt [name] procedure should be completed.  Victor risks and benefits of Victor procedure were explained to [name] and he was agreeable to  proceed.  Hospital Course:   Mr. [name] presented to [facility] on 08/22/2015.  He was taken to Victor operating room and underwent Aortic Valve Replacment using a 25 mm Edwards Magna Ease Pericardial Tissue Valve, Left Side [name] procedure, and clipping of LA Appendage.  He tolerated Victor procedure without difficulty and was taken to Victor SICU in stable condition.  He was extubated Victor evening of surgery.  During his stay in Victor SICU he was treated for HTN with NTG and Nipride drips.  These were weaned off as BP allows.  He was also weaned off Milrinone and Amiodarone drip as tolerated.  He developed an early post operative Ileus and was treated accordingly.  He was maintaining NSR and felt medically stable for transfer to Victor step down unit on POD #2.  He remained hypertensive and his oral medications were titrated as tolerated.  [name] continues to maintain NSR.  His pacing wires have been removed.  He will be discharged home on Pradaxa at 150 mg BID for 30 days.  He has moved his bowels and is tolerating a regular diet.  He is ambulating independently.  He is felt medically stable for discharge home today.   Significant Diagnostic Studies: cardiac graphics: Echocardiogram:   Study Conclusions  - Left ventricle: Victor cavity size was normal. There was mild  concentric hypertrophy. Systolic function was normal. Victor  estimated ejection fraction was in Victor range of 55% to 60%. Wall  motion was normal; there were no regional wall motion  abnormalities. - Ventricular septum: Septal motion showed paradox. These changes  are  consistent with right ventricular pacing. - Aortic valve: There was severe stenosis. - Left atrium: Victor atrium was mildly dilated. - Right atrium: Victor atrium was mildly dilated.  Treatments: surgery:   1. Aortic valve replacement with a 25 mm pericardial valve [facility] Ease, serial [id number]). 2. Left atrial clipping. 3. Left atrial [name]  procedure.  Disposition: 01-Home or Self Care   Discharge medications:  [name] has been discharged on:   1.Beta Blocker:  Yes [x  ]                              No   [   ]                              If No, reason:  2.Ace Inhibitor/ARB: Yes [ x ]                                     No  [    ]                                     If No, reason:  3.Statin:   Yes [ x ]                  No  [   ]                  If No, reason:  4.Ecasa:  Yes  [  x]                  No   [   ]                  If No, reason:         Discharge Instructions    Amb Referral to Cardiac Rehabilitation    Complete by:  As directed   Diagnosis:  Valve Replacement  Valve:  Aortic Comment - c/ [name], clipping right atrial appendage            Medication List    STOP taking these medications        carvedilol 25 MG tablet  Commonly known as:  COREG      TAKE these medications        albuterol 108 (90 Base) MCG/ACT inhaler  Commonly known as:  PROAIR HFA  Inhale 2 puffs into Victor lungs every 6 (six) hours as needed for wheezing or shortness of breath.     amiodarone 200 MG tablet  Commonly known as:  PACERONE  Take 1 tablet (200 mg total) by mouth 2 (two) times daily.     amLODipine 10 MG tablet  Commonly known as:  NORVASC  Take 1 tablet (10 mg total) by mouth daily.     aspirin 81 MG EC tablet  Take 1 tablet (81 mg total) by mouth daily. Swallow whole.     atorvastatin 40 MG tablet  Commonly known as:  LIPITOR  Take 1 tablet (40 mg total) by mouth daily.     dabigatran 150 MG Caps capsule  Commonly known as:  PRADAXA  Take 1 capsule (150 mg total) by mouth 2 (two) times daily.  fexofenadine 180 MG tablet  Commonly known as:  ALLEGRA  Take 1 tablet (180 mg total) by mouth daily.     fluticasone 50 MCG/ACT nasal spray  Commonly known as:  FLONASE  Place 2 sprays into both nostrils 2 (two) times daily.     gabapentin 300 MG capsule  Commonly known as:  NEURONTIN   Take 600 capsules by mouth at bedtime.     levothyroxine 175 MCG tablet  Commonly known as:  SYNTHROID, LEVOTHROID  Take 175 mcg by mouth daily before breakfast.     MELATONEX 3-10 MG Tbcr  Generic drug:  Melatonin-Pyridoxine  Take 1 tablet by mouth at bedtime.     metoprolol tartrate 25 MG tablet  Commonly known as:  LOPRESSOR  Take 1 tablet (25 mg total) by mouth 2 (two) times daily.     ondansetron 4 MG disintegrating tablet  Commonly known as:  ZOFRAN ODT  Take 1 tablet (4 mg total) by mouth every 8 (eight) hours as needed for nausea or vomiting.     oxyCODONE 5 MG immediate release tablet  Commonly known as:  Oxy IR/ROXICODONE  Take 1-2 tablets (5-10 mg total) by mouth every 3 (three) hours as needed for severe pain.     pantoprazole 40 MG tablet  Commonly known as:  PROTONIX  Take 40 mg by mouth daily.     rOPINIRole 1 MG tablet  Commonly known as:  REQUIP  Take 1 tablet (1 mg total) by mouth daily.     spironolactone 25 MG tablet  Commonly known as:  ALDACTONE  Take 0.5 tablets (12.5 mg total) by mouth daily.     traMADol 50 MG tablet  Commonly known as:  ULTRAM  Take 1 tablet by mouth daily.     valACYclovir 1000 MG tablet  Commonly known as:  VALTREX  Take 1 tablet by mouth every 12 (twelve) hours as needed (fever blisters).     valsartan 160 MG tablet  Commonly known as:  DIOVAN  Take 160 mg by mouth daily.     Vitamin D 2000 units Caps  Take 2,000 Units by mouth daily.       Follow-up Information    Follow up with Ivin Poot III, MD On 09/27/2015.   Specialty:  Cardiothoracic Surgery   Why:  Appointment is at 12:45   Contact information:   [place] [address] [phone]       Follow up with [facility] On 09/27/2015.   Why:  Please get CXR at 12:15   Contact information:   [facility]       Follow up with Nahser, Wonda Cheng, MD.   Specialty:  Cardiology   Why:  if you dont hear from office  please contact to set up 2 week follow up appointment   Contact information:   [address] [place] [address] [phone]       Signed: Ellwood Handler 08/28/2015, 12:51 PM

## 2015-08-26 LAB — BASIC METABOLIC PANEL
Anion gap: 9 (ref 5–15)
BUN: 33 mg/dL — ABNORMAL HIGH (ref 6–20)
CO2: 29 mmol/L (ref 22–32)
Calcium: 8.6 mg/dL — ABNORMAL LOW (ref 8.9–10.3)
Chloride: 97 mmol/L — ABNORMAL LOW (ref 101–111)
Creatinine, Ser: 1.55 mg/dL — ABNORMAL HIGH (ref 0.61–1.24)
GFR calc Af Amer: 52 mL/min — ABNORMAL LOW (ref >60)
GFR calc non Af Amer: 45 mL/min — ABNORMAL LOW (ref >60)
Glucose, Bld: 92 mg/dL (ref 65–99)
Potassium: 3.6 mmol/L (ref 3.5–5.1)
Sodium: 135 mmol/L (ref 135–145)

## 2015-08-26 LAB — CBC
HCT: 33.6 % — ABNORMAL LOW (ref 39.0–52.0)
Hemoglobin: 10.7 g/dL — ABNORMAL LOW (ref 13.0–17.0)
MCH: 27.7 pg (ref 26.0–34.0)
MCHC: 31.8 g/dL (ref 30.0–36.0)
MCV: 87 fL (ref 78.0–100.0)
Platelets: 230 10*3/uL (ref 150–400)
RBC: 3.86 MIL/uL — ABNORMAL LOW (ref 4.22–5.81)
RDW: 15.3 % (ref 11.5–15.5)
WBC: 15.6 10*3/uL — ABNORMAL HIGH (ref 4.0–10.5)

## 2015-08-26 LAB — GLUCOSE, CAPILLARY
Glucose-Capillary: 112 mg/dL — ABNORMAL HIGH (ref 65–99)
Glucose-Capillary: 126 mg/dL — ABNORMAL HIGH (ref 65–99)
Glucose-Capillary: 117 mg/dL — ABNORMAL HIGH (ref 65–99)

## 2015-08-26 NOTE — Progress Notes (Signed)
dc'ed pacing wires pt. tolerated well 

## 2015-08-26 NOTE — Progress Notes (Addendum)
4 Days Post-Op Procedure(s) (LRB): AORTIC VALVE REPLACEMENT (AVR) (N/A) MAZE (N/A) CLIPPING OF ATRIAL APPENDAGE (N/A) TRANSESOPHAGEAL ECHOCARDIOGRAM (TEE) (N/A) Subjective: Feeling better today , small + BM  Objective: Vital signs in last 24 hours: Temp:  [98 F (36.7 C)-98.8 F (37.1 C)] 98 F (36.7 C) (04/29 0554) Pulse Rate:  [70] 70 (04/29 0716) Cardiac Rhythm:  [-] Ventricular paced;Bundle branch block (04/29 0812) Resp:  [18] 18 (04/29 0716) BP: (130-180)/(64-87) 130/79 mmHg (04/29 0716) SpO2:  [91 %-100 %] 96 % (04/29 0716) Weight:  [215 lb 8 oz (97.75 kg)] 215 lb 8 oz (97.75 kg) (04/29 0554)  Hemodynamic parameters for last 24 hours:    Intake/Output from previous day: 04/28 0701 - 04/29 0700 In: 720 [P.O.:720] Out: 1250 [Urine:1250] Intake/Output this shift:    General appearance: alert, cooperative and no distress Heart: regular rate and rhythm Lungs: clear to auscultation bilaterally Abdomen: still a fair amt of distension, + BS, mild tenderness to palp Extremities: min edema Wound: chest dressing intact  Lab Results:  Recent Labs  08/25/15 0434 08/26/15 0444  WBC 15.9* 15.6*  HGB 10.5* 10.7*  HCT 33.2* 33.6*  PLT 179 230   BMET:  Recent Labs  08/25/15 0434 08/26/15 0444  NA 134* 135  K 4.7 3.6  CL 96* 97*  CO2 28 29  GLUCOSE 111* 92  BUN 24* 33*  CREATININE 1.41* 1.55*  CALCIUM 8.7* 8.6*    PT/INR:  Recent Labs  08/25/15 0952  LABPROT 17.4*  INR 1.41   ABG    Component Value Date/Time   PHART 7.315* 08/23/2015 0023   HCO3 21.0 08/23/2015 0023   TCO2 26 08/23/2015 1659   ACIDBASEDEF 5.0* 08/23/2015 0023   O2SAT 94.0 08/23/2015 0023   CBG (last 3)   Recent Labs  08/25/15 1134 08/25/15 1646 08/25/15 2102  GLUCAP 102* 98 148*    Meds Scheduled Meds: . acetaminophen  1,000 mg Oral Q6H   Or  . acetaminophen (TYLENOL) oral liquid 160 mg/5 mL  1,000 mg Per Tube Q6H  . amiodarone  200 mg Oral BID  . amLODipine  5 mg  Oral Daily  . antiseptic oral rinse  7 mL Mouth Rinse BID  . aspirin EC  325 mg Oral Daily   Or  . aspirin  324 mg Per Tube Daily  . atorvastatin  40 mg Oral Daily  . bisacodyl  10 mg Oral Daily   Or  . bisacodyl  10 mg Rectal Daily  . chlorhexidine  30 mL Topical UD  . dextromethorphan-guaiFENesin  1 tablet Oral BID  . docusate sodium  200 mg Oral Daily  . enoxaparin (LOVENOX) injection  40 mg Subcutaneous Q24H  . furosemide  40 mg Intravenous Once  . furosemide  40 mg Intravenous Daily  . insulin aspart  0-24 Units Subcutaneous TID AC & HS  . insulin detemir  12 Units Subcutaneous BID  . ipratropium  0.5 mg Nebulization BID  . irbesartan  75 mg Oral Daily  . levalbuterol  1.25 mg Nebulization BID  . levothyroxine  175 mcg Oral QAC breakfast  . loratadine  10 mg Oral Daily  . metoCLOPramide (REGLAN) injection  10 mg Intravenous Q6H  . metoprolol tartrate  25 mg Oral BID  . pantoprazole  40 mg Oral Daily  . rOPINIRole  1 mg Oral Daily  . simethicone  80 mg Oral QID  . sodium chloride flush  3 mL Intravenous Q12H  . sodium chloride flush  3  mL Intravenous Q12H   Continuous Infusions: . sodium chloride Stopped (08/23/15 0941)  . sodium chloride 250 mL (08/24/15 0800)  . sodium chloride Stopped (08/23/15 0941)  . lactated ringers Stopped (08/22/15 1330)  . nitroGLYCERIN Stopped (08/22/15 1805)   PRN Meds:.sodium chloride, sodium chloride, fluticasone, hydrALAZINE, levalbuterol, magnesium hydroxide, metoprolol, ondansetron (ZOFRAN) IV, oxyCODONE, sodium chloride flush, sodium chloride flush, traMADol  Xrays Dg Chest 2 View  08/25/2015  CLINICAL DATA:  Status post aortic valve replacement. Right apical pneumothorax. EXAM: CHEST  2 VIEW COMPARISON:  Chest x-rays dated 08/24/2015, 08/23/2015 and 08/18/2015 FINDINGS: The small right pneumothorax is slightly more prominent with a small right hydro pneumothorax component at the right base laterally which appears to be new. Slight  cardiomegaly. Pulmonary vascularity is normal. New slight atelectasis at the left lung base. Small bilateral pleural effusions. Aortic valve replacement, left atrial appendage clip, and pacemaker are in place. IMPRESSION: Slight increase in small right hydro pneumothorax. Small bilateral pleural effusions. New slight atelectasis at the left lung apex. Electronically Signed   By: Lorriane Shire M.D.   On: 08/25/2015 07:33    Assessment/Plan: S/P Procedure(s) (LRB): AORTIC VALVE REPLACEMENT (AVR) (N/A) MAZE (N/A) CLIPPING OF ATRIAL APPENDAGE (N/A) TRANSESOPHAGEAL ECHOCARDIOGRAM (TEE) (N/A)  1 ileus is resolving, cont to advance diet 2 D/C epw's- has PPM 3 push pulm toilet/rehab as able 4 creat up a bit, uo is good, cont gentle diuresis 5 f/u labs/ CXR in am 6 home in 1-2 days  LOS: 4 days    GOLD,WAYNE E 08/26/2015  Patient seen and examined, agree with above Ileus resolving  Remo Lipps C. Roxan Hockey, MD [facility] [phone]

## 2015-08-26 NOTE — Progress Notes (Addendum)
CARDIAC REHAB PHASE I   PRE:  Rate/Rhythm: 70 paced   BP:  Sitting: 129/69        SaO2: 100 RA  MODE:  Ambulation: 490 ft   POST:  Rate/Rhythm: 70 paced  BP:  Sitting: 134/82         SaO2: 95 RA  Pt ambulated 490 ft on RA, rolling walker, hand held assist, steady gait, tolerated well. Pt c/o mild DOE, brief standing rest x1, O2 sats 95% on RA while ambulating. Cardiac surgery discharge education completed. Reviewed IS, sternal precautions, activity progression, exercise, heart healthy diet, carb counting, sodium restrictions, daily weights and phase 2 cardiac rehab. Pt verbalized understanding. Phase 2 cardiac rehab referral sent to [facility] per pt request. Encouraged additional ambulation x2 today. Pt to recliner after walk, on RA (RN aware), call bell within reach. Will follow-up Monday if pt does not discharge tomorrow.   [id number] Lenna Sciara, RN, BSN 08/26/2015 10:45 AM

## 2015-08-27 ENCOUNTER — Inpatient Hospital Stay (HOSPITAL_COMMUNITY): Payer: Medicare Other

## 2015-08-27 LAB — CBC
HCT: 29.6 % — ABNORMAL LOW (ref 39.0–52.0)
Hemoglobin: 9.6 g/dL — ABNORMAL LOW (ref 13.0–17.0)
MCH: 27.8 pg (ref 26.0–34.0)
MCHC: 32.4 g/dL (ref 30.0–36.0)
MCV: 85.8 fL (ref 78.0–100.0)
Platelets: 233 10*3/uL (ref 150–400)
RBC: 3.45 MIL/uL — ABNORMAL LOW (ref 4.22–5.81)
RDW: 14.9 % (ref 11.5–15.5)
WBC: 12.9 10*3/uL — ABNORMAL HIGH (ref 4.0–10.5)

## 2015-08-27 LAB — GLUCOSE, CAPILLARY
Glucose-Capillary: 154 mg/dL — ABNORMAL HIGH (ref 65–99)
Glucose-Capillary: 155 mg/dL — ABNORMAL HIGH (ref 65–99)
Glucose-Capillary: 109 mg/dL — ABNORMAL HIGH (ref 65–99)

## 2015-08-27 LAB — BASIC METABOLIC PANEL
Anion gap: 12 (ref 5–15)
BUN: 39 mg/dL — ABNORMAL HIGH (ref 6–20)
CO2: 25 mmol/L (ref 22–32)
Calcium: 8.4 mg/dL — ABNORMAL LOW (ref 8.9–10.3)
Chloride: 98 mmol/L — ABNORMAL LOW (ref 101–111)
Creatinine, Ser: 1.66 mg/dL — ABNORMAL HIGH (ref 0.61–1.24)
GFR calc Af Amer: 48 mL/min — ABNORMAL LOW (ref >60)
GFR calc non Af Amer: 41 mL/min — ABNORMAL LOW (ref >60)
Glucose, Bld: 119 mg/dL — ABNORMAL HIGH (ref 65–99)
Potassium: 3.2 mmol/L — ABNORMAL LOW (ref 3.5–5.1)
Sodium: 135 mmol/L (ref 135–145)

## 2015-08-27 MED ORDER — AMLODIPINE BESYLATE 10 MG PO TABS
10.0000 mg | ORAL_TABLET | Freq: Every day | ORAL | Status: DC
  Administered 2015-08-27 – 2015-08-28 (×2): 10 mg via ORAL
  Filled 2015-08-27 (×2): qty 1

## 2015-08-27 NOTE — Progress Notes (Addendum)
[address]       [place] [address]             [phone]      5 Days Post-Op Procedure(s) (LRB): AORTIC VALVE REPLACEMENT (AVR) (N/A) MAZE (N/A) CLIPPING OF ATRIAL APPENDAGE (N/A) TRANSESOPHAGEAL ECHOCARDIOGRAM (TEE) (N/A) Subjective: Feels ok, tol regular food, + BM x2  Objective: Vital signs in last 24 hours: Temp:  [98 F (36.7 C)-98.6 F (37 C)] 98.2 F (36.8 C) (04/30 0544) Pulse Rate:  [70] 70 (04/30 0735) Cardiac Rhythm:  [-] Ventricular paced;Bundle branch block (04/30 0728) Resp:  [16-20] 18 (04/30 0735) BP: (112-156)/(53-72) 156/72 mmHg (04/30 0544) SpO2:  [92 %-95 %] 94 % (04/30 0735) Weight:  [212 lb 14.4 oz (96.571 kg)] 212 lb 14.4 oz (96.571 kg) (04/30 0544)  Hemodynamic parameters for last 24 hours:    Intake/Output from previous day: 04/29 0701 - 04/30 0700 In: 120 [P.O.:120] Out: -  Intake/Output this shift:    General appearance: alert, cooperative and no distress Heart: regular rate and rhythm Lungs: clear to auscultation bilaterally Abdomen: mod distension,+ BS,non tender Extremities: trace edema Wound: incis healing well  Lab Results:  Recent Labs  08/26/15 0444 08/27/15 0343  WBC 15.6* 12.9*  HGB 10.7* 9.6*  HCT 33.6* 29.6*  PLT 230 233   BMET:  Recent Labs  08/26/15 0444 08/27/15 0343  NA 135 135  K 3.6 3.2*  CL 97* 98*  CO2 29 25  GLUCOSE 92 119*  BUN 33* 39*  CREATININE 1.55* 1.66*  CALCIUM 8.6* 8.4*    PT/INR:  Recent Labs  08/25/15 0952  LABPROT 17.4*  INR 1.41   ABG    Component Value Date/Time   PHART 7.315* 08/23/2015 0023   HCO3 21.0 08/23/2015 0023   TCO2 26 08/23/2015 1659   ACIDBASEDEF 5.0* 08/23/2015 0023   O2SAT 94.0 08/23/2015 0023   CBG (last 3)   Recent Labs  08/26/15 1111 08/26/15 1631 08/26/15 2135  GLUCAP 112* 126* 117*    Meds Scheduled Meds: . acetaminophen  1,000 mg Oral Q6H   Or  . acetaminophen (TYLENOL) oral liquid 160 mg/5 mL  1,000 mg Per  Tube Q6H  . amiodarone  200 mg Oral BID  . amLODipine  5 mg Oral Daily  . antiseptic oral rinse  7 mL Mouth Rinse BID  . aspirin EC  325 mg Oral Daily   Or  . aspirin  324 mg Per Tube Daily  . atorvastatin  40 mg Oral Daily  . bisacodyl  10 mg Oral Daily   Or  . bisacodyl  10 mg Rectal Daily  . chlorhexidine  30 mL Topical UD  . dextromethorphan-guaiFENesin  1 tablet Oral BID  . docusate sodium  200 mg Oral Daily  . enoxaparin (LOVENOX) injection  40 mg Subcutaneous Q24H  . furosemide  40 mg Intravenous Once  . furosemide  40 mg Intravenous Daily  . insulin aspart  0-24 Units Subcutaneous TID AC & HS  . insulin detemir  12 Units Subcutaneous BID  . ipratropium  0.5 mg Nebulization BID  . irbesartan  75 mg Oral Daily  . levalbuterol  1.25 mg Nebulization BID  . levothyroxine  175 mcg Oral QAC breakfast  . loratadine  10 mg Oral Daily  . metoCLOPramide (REGLAN) injection  10 mg Intravenous Q6H  . metoprolol tartrate  25 mg Oral BID  . pantoprazole  40 mg Oral Daily  . rOPINIRole  1  mg Oral Daily  . simethicone  80 mg Oral QID  . sodium chloride flush  3 mL Intravenous Q12H  . sodium chloride flush  3 mL Intravenous Q12H   Continuous Infusions: . sodium chloride Stopped (08/23/15 0941)  . sodium chloride 250 mL (08/24/15 0800)  . sodium chloride Stopped (08/23/15 0941)  . lactated ringers Stopped (08/22/15 1330)  . nitroGLYCERIN Stopped (08/22/15 1805)   PRN Meds:.sodium chloride, sodium chloride, fluticasone, hydrALAZINE, levalbuterol, magnesium hydroxide, metoprolol, ondansetron (ZOFRAN) IV, oxyCODONE, sodium chloride flush, sodium chloride flush, traMADol  Xrays No results found.  Assessment/Plan: S/P Procedure(s) (LRB): AORTIC VALVE REPLACEMENT (AVR) (N/A) MAZE (N/A) CLIPPING OF ATRIAL APPENDAGE (N/A) TRANSESOPHAGEAL ECHOCARDIOGRAM (TEE) (N/A)  1 progressing well 2 BP up at times, creat also increasing. I think we should stop ARB and lasix at this time. Increase   norvasc to 10 mg,  and re-check creat in am. 3 cont routine pulm toilet/rehab  LOS: 5 days    GOLD,WAYNE E 08/27/2015  Patient seen and examined, agree with above. Creatinine rising is only issue at present  Isabel. Roxan Hockey, MD [facility] [phone]

## 2015-08-28 ENCOUNTER — Other Ambulatory Visit: Payer: Self-pay | Admitting: Physician Assistant

## 2015-08-28 LAB — BASIC METABOLIC PANEL
Anion gap: 12 (ref 5–15)
BUN: 31 mg/dL — ABNORMAL HIGH (ref 6–20)
CO2: 28 mmol/L (ref 22–32)
Calcium: 8.6 mg/dL — ABNORMAL LOW (ref 8.9–10.3)
Chloride: 97 mmol/L — ABNORMAL LOW (ref 101–111)
Creatinine, Ser: 1.47 mg/dL — ABNORMAL HIGH (ref 0.61–1.24)
GFR calc Af Amer: 56 mL/min — ABNORMAL LOW (ref >60)
GFR calc non Af Amer: 48 mL/min — ABNORMAL LOW (ref >60)
Glucose, Bld: 90 mg/dL (ref 65–99)
Potassium: 3.1 mmol/L — ABNORMAL LOW (ref 3.5–5.1)
Sodium: 137 mmol/L (ref 135–145)

## 2015-08-28 LAB — GLUCOSE, CAPILLARY
Glucose-Capillary: 104 mg/dL — ABNORMAL HIGH (ref 65–99)
Glucose-Capillary: 177 mg/dL — ABNORMAL HIGH (ref 65–99)

## 2015-08-28 MED ORDER — METOPROLOL TARTRATE 25 MG PO TABS: 25.0000 mg | ORAL_TABLET | Freq: Two times a day (BID) | ORAL | Status: DC

## 2015-08-28 MED ORDER — ASPIRIN 81 MG PO TBEC: 81.0000 mg | DELAYED_RELEASE_TABLET | Freq: Every day | ORAL | Status: DC

## 2015-08-28 MED ORDER — AMLODIPINE BESYLATE 10 MG PO TABS: 10.0000 mg | ORAL_TABLET | Freq: Every day | ORAL | Status: DC

## 2015-08-28 MED ORDER — DABIGATRAN ETEXILATE MESYLATE 150 MG PO CAPS: 150.0000 mg | ORAL_CAPSULE | Freq: Two times a day (BID) | ORAL | Status: DC

## 2015-08-28 MED ORDER — OXYCODONE HCL 5 MG PO TABS: 5.0000 mg | ORAL_TABLET | ORAL | Status: DC | PRN

## 2015-08-28 MED ORDER — AMIODARONE HCL 200 MG PO TABS: 200.0000 mg | ORAL_TABLET | Freq: Two times a day (BID) | ORAL | Status: DC

## 2015-08-28 NOTE — Discharge Summary (Signed)
Freddrick March, PA-C Physician Assistant Certified Addendum Cardiothoracic Surgery Progress Notes 08/25/2015 12:05 PM    Expand All Collapse All   Physician Discharge Summary  Patient ID: QUE LADUCA MRN: [id number] DOB/AGE: Aug 26, 1950 65 y.o.  Admit date: 08/22/2015 Discharge date: 08/28/2015  Admission Diagnoses:  Patient Active Problem List   Diagnosis Date Noted  . GERD (gastroesophageal reflux disease) 09/20/2014  . 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 Dyspnea 09/15/2013  . Pulmonary HTN ([place]) 09/15/2013  . Shortness of breath 05/05/2013  . 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 Bradycardia 03/31/2012  . Second degree AV block 03/31/2012  . AV block, 1st degree 02/01/2012  . Atrial fibrillation ([place]) 11/13/2011  . Fatigue 11/13/2011  . Hypertension 11/13/2011  . Aortic stenosis 11/13/2011  . Sleep apnea 11/13/2011   Discharge Diagnoses:   Patient Active Problem List   Diagnosis Date Noted  . S/P AVR 08/22/2015  . GERD (gastroesophageal reflux disease) 09/20/2014  . 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 Dyspnea 09/15/2013  . Pulmonary HTN ([place]) 09/15/2013  . Shortness of breath 05/05/2013  . 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 Bradycardia 03/31/2012  . Second degree AV block 03/31/2012  . AV block, 1st degree 02/01/2012  . Atrial fibrillation ([place]) 11/13/2011  . Fatigue 11/13/2011  . Hypertension 11/13/2011  . Aortic stenosis 11/13/2011  . Sleep apnea 11/13/2011   Discharged Condition: good  History of Present Illness:  Mr. [name] is a 65 yo male who presented to [facility] with diagnosis of Severe Aortic Stenosis and Chronic Atrial Fibrillation on Pradaxa. He has complaints of shortness of breath on exertion and restricted exercise tolerance. He has no symptoms at rest and denies chest pain. Coronary catheterization was performed and showed no CAD. He was evaluated by Dr. Prescott Gum on  08/16/2015 at which time he was in agreement the patient would benefit from Aortic Valve Replacement. It was also felt MAZE procedure should be completed. The risks and benefits of the procedure were explained to the patient and he was agreeable to proceed.  Hospital Course:   Mr. Matesic presented to [facility] on 08/22/2015. He was taken to the operating room and underwent Aortic Valve Replacment using a 25 mm Edwards Magna Ease Pericardial Tissue Valve, Left Side MAZE procedure, and clipping of LA Appendage. He tolerated the procedure without difficulty and was taken to the SICU in stable condition. He was extubated the evening of surgery. During his stay in the SICU he was treated for HTN with NTG and Nipride drips. These were weaned off as BP allows. He was also weaned off Milrinone and Amiodarone drip as tolerated. He developed an early post operative Ileus and was treated accordingly. He was maintaining NSR and felt medically stable for transfer to the step down unit on POD #2. He remained hypertensive and his oral medications were titrated as tolerated. The patient continues to maintain NSR. His pacing wires have been removed. He will be discharged home on Pradaxa at 150 mg BID for 30 days. He has moved his bowels and is tolerating a regular diet. He is ambulating independently. He is felt medically stable for discharge home today.   Significant Diagnostic Studies: cardiac graphics: Echocardiogram:   Study Conclusions  - Left ventricle: The cavity size was normal. There was mild  concentric hypertrophy. Systolic function was normal. The  estimated ejection fraction was in the range of 55% to 60%. Wall  motion was normal; there were no regional wall motion  abnormalities. - Ventricular septum: Septal motion showed paradox. These  changes  are consistent with right ventricular pacing. - Aortic valve: There was severe stenosis. - Left atrium: The atrium was mildly  dilated. - Right atrium: The atrium was mildly dilated.  Treatments: surgery:   1. Aortic valve replacement with a 25 mm pericardial valve [facility] Ease, serial [id number]). 2. Left atrial clipping. 3. Left atrial maze procedure.  Disposition: 01-Home or Self Care   Discharge medications:  The patient has been discharged on:   1.Beta Blocker: Yes [x ]  No [ ]   If No, reason:  2.Ace Inhibitor/ARB: Yes [ x ]  No [ ]   If No, reason:  3.Statin: Yes [ x ]  No [ ]   If No, reason:  4.Ecasa: Yes [ x]  No [ ]   If No, reason:         Discharge Instructions    Amb Referral to Cardiac Rehabilitation  Complete by: As directed   Diagnosis: Valve Replacement  Valve: Aortic Comment - c/ MAZE, clipping right atrial appendage            Medication List    STOP taking these medications       carvedilol 25 MG tablet  Commonly known as: COREG      TAKE these medications       albuterol 108 (90 Base) MCG/ACT inhaler  Commonly known as: PROAIR HFA  Inhale 2 puffs into the lungs every 6 (six) hours as needed for wheezing or shortness of breath.     amiodarone 200 MG tablet  Commonly known as: PACERONE  Take 1 tablet (200 mg total) by mouth 2 (two) times daily.     amLODipine 10 MG tablet  Commonly known as: NORVASC  Take 1 tablet (10 mg total) by mouth daily.     aspirin 81 MG EC tablet  Take 1 tablet (81 mg total) by mouth daily. Swallow whole.     atorvastatin 40 MG tablet  Commonly known as: LIPITOR  Take 1 tablet (40 mg total) by mouth daily.     dabigatran 150 MG Caps capsule  Commonly known as: PRADAXA  Take 1 capsule (150 mg total) by mouth 2 (two) times daily.      fexofenadine 180 MG tablet  Commonly known as: ALLEGRA  Take 1 tablet (180 mg total) by mouth daily.     fluticasone 50 MCG/ACT nasal spray  Commonly known as: FLONASE  Place 2 sprays into both nostrils 2 (two) times daily.     gabapentin 300 MG capsule  Commonly known as: NEURONTIN  Take 600 capsules by mouth at bedtime.     levothyroxine 175 MCG tablet  Commonly known as: SYNTHROID, LEVOTHROID  Take 175 mcg by mouth daily before breakfast.     MELATONEX 3-10 MG Tbcr  Generic drug: Melatonin-Pyridoxine  Take 1 tablet by mouth at bedtime.     metoprolol tartrate 25 MG tablet  Commonly known as: LOPRESSOR  Take 1 tablet (25 mg total) by mouth 2 (two) times daily.     ondansetron 4 MG disintegrating tablet  Commonly known as: ZOFRAN ODT  Take 1 tablet (4 mg total) by mouth every 8 (eight) hours as needed for nausea or vomiting.     oxyCODONE 5 MG immediate release tablet  Commonly known as: Oxy IR/ROXICODONE  Take 1-2 tablets (5-10 mg total) by mouth every 3 (three) hours as needed for severe pain.     pantoprazole 40 MG tablet  Commonly known as: PROTONIX  Take 40 mg  by mouth daily.     rOPINIRole 1 MG tablet  Commonly known as: REQUIP  Take 1 tablet (1 mg total) by mouth daily.     spironolactone 25 MG tablet  Commonly known as: ALDACTONE  Take 0.5 tablets (12.5 mg total) by mouth daily.     traMADol 50 MG tablet  Commonly known as: ULTRAM  Take 1 tablet by mouth daily.     valACYclovir 1000 MG tablet  Commonly known as: VALTREX  Take 1 tablet by mouth every 12 (twelve) hours as needed (fever blisters).     valsartan 160 MG tablet  Commonly known as: DIOVAN  Take 160 mg by mouth daily.     Vitamin D 2000 units Caps  Take 2,000 Units by mouth daily.       Follow-up Information    Follow up with Ivin Poot III, MD On 09/27/2015.   Specialty: Cardiothoracic  Surgery   Why: Appointment is at 12:45   Contact information:   [place] [address] [phone]       Follow up with [facility] On 09/27/2015.   Why: Please get CXR at 12:15   Contact information:   [facility]       Follow up with Nahser, Wonda Cheng, MD.   Specialty: Cardiology   Why: if you dont hear from office please contact to set up 2 week follow up appointment   Contact information:   [address] [place] [address] [phone]       Signed: Ellwood Handler 08/28/2015, 12:51 PM        Revision History

## 2015-08-28 NOTE — Discharge Instructions (Signed)
1. Please do not restart Diovan or Spironolactone until Wednesday 08/30/2015   Aortic Valve Replacement, Care After Refer to this sheet in the next few weeks. These instructions provide you with information on caring for yourself after your procedure. Your health care provider may also give you specific instructions. Your treatment has been planned according to current medical practices, but problems sometimes occur. Call your health care provider if you have any problems or questions after your procedure. HOME CARE INSTRUCTIONS   Take medicines only as directed by your health care provider.  If your health care provider has prescribed elastic stockings, wear them as directed.  Take frequent naps or rest often throughout the day.  Avoid lifting over 10 lbs (4.5 kg) or pushing or pulling things with your arms for 6-8 weeks or as directed by your health care provider.  Avoid driving or airplane travel for 4-6 weeks after surgery or as directed by your health care provider. If you are riding in a car for an extended period, stop every 1-2 hours to stretch your legs. Keep a record of your medicines and medical history with you when traveling.  Do not drive or operate heavy machinery while taking pain medicine. (narcotics).  Do not cross your legs.  Do not use any tobacco products including cigarettes, chewing tobacco, or electronic cigarettes. If you need help quitting, ask your health care provider.  Do not take baths, swim, or use a hot tub until your health care provider approves. Take showers once your health care provider approves. Pat incisions dry. Do not rub incisions with a washcloth or towel.  Avoid climbing stairs and using the handrail to pull yourself up for the first 2-3 weeks after surgery.  Return to work as directed by your health care provider.  Drink enough fluid to keep your urine clear or pale yellow.  Do not strain to have a bowel movement. Eat high-fiber foods if you  become constipated. You may also take a medicine to help you have a bowel movement (laxative) as directed by your health care provider.  Resume sexual activity as directed by your health care provider. Men should not use medicines for erectile dysfunction until their doctor says it isokay.  If you had a certain type of heart condition in the past, you may need to take antibiotic medicine before having dental work or surgery. Let your dentist and health care providers know if you had one or more of the following:  Previous endocarditis.  An artificial (prosthetic) heart valve.  Congenital heart disease. SEEK MEDICAL CARE IF:  You develop a skin rash.   You experience sudden changes in your weight.  You have a fever. SEEK IMMEDIATE MEDICAL CARE IF:   You develop chest pain that is not coming from your incision.  You have drainage (pus), redness, swelling, or pain at your incision site.   You develop shortness of breath or have difficulty breathing.   You have increased bleeding from your incision site.   You develop light-headedness.  MAKE SURE YOU:   Understand these directions.  Will watch your condition.  Will get help right away if you are not doing well or get worse.   This information is not intended to replace advice given to you by your health care provider. Make sure you discuss any questions you have with your health care provider.   Document Released: 11/01/2004 Document Revised: 05/06/2014 Document Reviewed: 01/28/2012 Elsevier Interactive Patient Education 2016 [place] on my  medicine - Pradaxa (dabigatran)  Why was Pradaxa prescribed for you? Pradaxa was prescribed for you to reduce the risk of forming blood clots that cause a stroke if you have a medical condition called atrial fibrillation (a type of irregular heartbeat).    What do you Need to know about PradAXa? Take your Pradaxa TWICE DAILY - one capsule in the morning and  one tablet in the evening with or without food.  It would be best to take the doses about the same time each day.  The capsules should not be broken, chewed or opened - they must be swallowed whole.  Do not store Pradaxa in other medication containers - once the bottle is opened the Pradaxa should be used within FOUR months; throw away any capsules that havent been by that time.  Take Pradaxa exactly as prescribed by your doctor.  DO NOT stop taking Pradaxa without talking to the doctor who prescribed the medication.  Stopping without other stroke prevention medication to take the place of Pradaxa may increase your risk of developing a clot that causes a stroke.  Refill your prescription before you run out.  After discharge, you should have regular check-up appointments with your healthcare provider that is prescribing your Pradaxa.  In the future your dose may need to be changed if your kidney function or weight changes by a significant amount.  What do you do if you miss a dose? If you miss a dose, take it as soon as you remember on the same day.  If your next dose is less than 6 hours away, skip the missed dose.  Do not take two doses of PRADAXA at the same time.  Important Safety Information A possible side effect of Pradaxa is bleeding. You should call your healthcare provider right away if you experience any of the following: ? Bleeding from an injury or your nose that does not stop. ? Unusual colored urine (red or dark brown) or unusual colored stools (red or black). ? Unusual bruising for unknown reasons. ? A serious fall or if you hit your head (even if there is no bleeding).  Some medicines may interact with Pradaxa and might increase your risk of bleeding or clotting while on Pradaxa. To help avoid this, consult your healthcare provider or pharmacist prior to using any new prescription or non-prescription medications, including herbals, vitamins, non-steroidal anti-inflammatory  drugs (NSAIDs) and supplements.  This website has more information on Pradaxa (dabigatran): https://www.pradaxa.com

## 2015-08-28 NOTE — Progress Notes (Signed)
CARDIAC REHAB PHASE I   PRE:  Rate/Rhythm: paced 70  BP:  Supine:   Sitting: 164/65  Standing:    SaO2: 94%RA  MODE:  Ambulation: 150 ft   POST:  Rate/Rhythm: 70 paced  BP:  Supine:   Sitting: 163/58  Standing:    SaO2: 95%RA 0920-0938 Pt walked 4 times yesterday but had just washed up and tired now. Pt stated he has not gotten much sleep at all the whole hospital stay and is exhausted. Walked 150 ft on RA with rolling walker. Does not want walker for home use. No questions re ed received Saturday.   Graylon Good, RN BSN  08/28/2015 9:33 AM

## 2015-08-28 NOTE — Progress Notes (Signed)
      [facility] [address]       [place] [address]             [phone]      6 Days Post-Op Procedure(s) (LRB): AORTIC VALVE REPLACEMENT (AVR) (N/A) MAZE (N/A) CLIPPING OF ATRIAL APPENDAGE (N/A) TRANSESOPHAGEAL ECHOCARDIOGRAM (TEE) (N/A)   Subjective:  [name] has no complaints.  He is hoping to go home today.  Objective: Vital signs in last 24 hours: Temp:  [98 F (36.7 C)-98.8 F (37.1 C)] 98.2 F (36.8 C) (05/01 0626) Pulse Rate:  [70] 70 (05/01 0626) Cardiac Rhythm:  [-] Ventricular paced;Bundle branch block (04/30 1950) Resp:  [18] 18 (04/30 2037) BP: (141-153)/(49-61) 153/61 mmHg (05/01 0626) SpO2:  [93 %-96 %] 95 % (05/01 0626) Weight:  [216 lb 4.8 oz (98.113 kg)] 216 lb 4.8 oz (98.113 kg) (05/01 0526)  General appearance: alert, cooperative and no distress Heart: regular rate and rhythm Lungs: clear to auscultation bilaterally Abdomen: soft, non-tender; bowel sounds normal; no masses,  no organomegaly Extremities: edema 1+ Wound: clean and dry  Lab Results:  Recent Labs  08/26/15 0444 08/27/15 0343  WBC 15.6* 12.9*  HGB 10.7* 9.6*  HCT 33.6* 29.6*  PLT 230 233   BMET:  Recent Labs  08/27/15 0343 08/28/15 0301  NA 135 137  K 3.2* 3.1*  CL 98* 97*  CO2 25 28  GLUCOSE 119* 90  BUN 39* 31*  CREATININE 1.66* 1.47*  CALCIUM 8.4* 8.6*    PT/INR:  Recent Labs  08/25/15 0952  LABPROT 17.4*  INR 1.41   ABG    Component Value Date/Time   PHART 7.315* 08/23/2015 0023   HCO3 21.0 08/23/2015 0023   TCO2 26 08/23/2015 1659   ACIDBASEDEF 5.0* 08/23/2015 0023   O2SAT 94.0 08/23/2015 0023   CBG (last 3)   Recent Labs  08/27/15 1652 08/27/15 2206 08/28/15 0641  GLUCAP 155* 109* 104*    Assessment/Plan: S/P Procedure(s) (LRB): AORTIC VALVE REPLACEMENT (AVR) (N/A) MAZE (N/A) CLIPPING OF ATRIAL APPENDAGE (N/A) TRANSESOPHAGEAL ECHOCARDIOGRAM (TEE) (N/A)  1. CV- NSR, remains Hypertensive- on Lopressor, Norvasc,  creatinine is trending back down, will restart home ARB 2. Pulm- no acute issues 3. Renal- creatinine down to 1.47, off Lasix  Yesterday, will restart home Spironolactone Wednesday 4. GI- Ileus, resolving, patient has moved bowels twice, tolerating regular diet 5. Dispo- patient stable, will plan to discharge home today    LOS: 6 days    BARRETT, ERIN 08/28/2015

## 2015-08-28 NOTE — Progress Notes (Signed)
Patient D/C home with wife. IV removed. Tele monitor removed. CCMD notified. Personal belongings given to wife. Padaxa education done. Materials given. D/C education done. Patient to D/C home with wife.   Domingo Dimes RN

## 2015-09-02 ENCOUNTER — Inpatient Hospital Stay (HOSPITAL_COMMUNITY)
Admission: EM | Admit: 2015-09-02 | Discharge: 2015-09-09 | DRG: 291 | Disposition: A | Discharge status: Home / Self Care | Payer: Medicare Other | Attending: Family Medicine | Admitting: Family Medicine

## 2015-09-02 ENCOUNTER — Encounter (HOSPITAL_COMMUNITY): Payer: Self-pay | Admitting: *Deleted

## 2015-09-02 ENCOUNTER — Emergency Department (HOSPITAL_COMMUNITY): Payer: Medicare Other

## 2015-09-02 DIAGNOSIS — N183 Chronic kidney disease, stage 3 (moderate): Secondary | ICD-10-CM | POA: Diagnosis present

## 2015-09-02 DIAGNOSIS — R410 Disorientation, unspecified: Secondary | ICD-10-CM | POA: Diagnosis not present

## 2015-09-02 DIAGNOSIS — I5033 Acute on chronic diastolic (congestive) heart failure: Secondary | ICD-10-CM | POA: Diagnosis present

## 2015-09-02 DIAGNOSIS — I130 Hypertensive heart and chronic kidney disease with heart failure and stage 1 through stage 4 chronic kidney disease, or unspecified chronic kidney disease: Secondary | ICD-10-CM | POA: Diagnosis present

## 2015-09-02 DIAGNOSIS — I2510 Atherosclerotic heart disease of native coronary artery without angina pectoris: Secondary | ICD-10-CM | POA: Diagnosis present

## 2015-09-02 DIAGNOSIS — Z8249 Family history of ischemic heart disease and other diseases of the circulatory system: Secondary | ICD-10-CM

## 2015-09-02 DIAGNOSIS — Z7901 Long term (current) use of anticoagulants: Secondary | ICD-10-CM | POA: Diagnosis not present

## 2015-09-02 DIAGNOSIS — T462X5A Adverse effect of other antidysrhythmic drugs, initial encounter: Secondary | ICD-10-CM | POA: Diagnosis present

## 2015-09-02 DIAGNOSIS — J918 Pleural effusion in other conditions classified elsewhere: Secondary | ICD-10-CM | POA: Diagnosis present

## 2015-09-02 DIAGNOSIS — G4733 Obstructive sleep apnea (adult) (pediatric): Secondary | ICD-10-CM | POA: Diagnosis present

## 2015-09-02 DIAGNOSIS — J9621 Acute and chronic respiratory failure with hypoxia: Secondary | ICD-10-CM

## 2015-09-02 DIAGNOSIS — J810 Acute pulmonary edema: Secondary | ICD-10-CM

## 2015-09-02 DIAGNOSIS — R591 Generalized enlarged lymph nodes: Secondary | ICD-10-CM | POA: Diagnosis present

## 2015-09-02 DIAGNOSIS — J90 Pleural effusion, not elsewhere classified: Secondary | ICD-10-CM | POA: Diagnosis not present

## 2015-09-02 DIAGNOSIS — Z952 Presence of prosthetic heart valve: Secondary | ICD-10-CM

## 2015-09-02 DIAGNOSIS — Z953 Presence of xenogenic heart valve: Secondary | ICD-10-CM

## 2015-09-02 DIAGNOSIS — Z954 Presence of other heart-valve replacement: Secondary | ICD-10-CM | POA: Diagnosis not present

## 2015-09-02 DIAGNOSIS — I10 Essential (primary) hypertension: Secondary | ICD-10-CM | POA: Diagnosis not present

## 2015-09-02 DIAGNOSIS — D638 Anemia in other chronic diseases classified elsewhere: Secondary | ICD-10-CM | POA: Diagnosis present

## 2015-09-02 DIAGNOSIS — I482 Chronic atrial fibrillation: Secondary | ICD-10-CM | POA: Diagnosis present

## 2015-09-02 DIAGNOSIS — R778 Other specified abnormalities of plasma proteins: Secondary | ICD-10-CM

## 2015-09-02 DIAGNOSIS — M797 Fibromyalgia: Secondary | ICD-10-CM | POA: Diagnosis present

## 2015-09-02 DIAGNOSIS — R7303 Prediabetes: Secondary | ICD-10-CM | POA: Diagnosis present

## 2015-09-02 DIAGNOSIS — K219 Gastro-esophageal reflux disease without esophagitis: Secondary | ICD-10-CM | POA: Diagnosis present

## 2015-09-02 DIAGNOSIS — T368X5A Adverse effect of other systemic antibiotics, initial encounter: Secondary | ICD-10-CM | POA: Diagnosis present

## 2015-09-02 DIAGNOSIS — Y92238 Other place in hospital as the place of occurrence of the external cause: Secondary | ICD-10-CM | POA: Diagnosis present

## 2015-09-02 DIAGNOSIS — Z87891 Personal history of nicotine dependence: Secondary | ICD-10-CM

## 2015-09-02 DIAGNOSIS — E785 Hyperlipidemia, unspecified: Secondary | ICD-10-CM | POA: Diagnosis present

## 2015-09-02 DIAGNOSIS — I712 Thoracic aortic aneurysm, without rupture: Secondary | ICD-10-CM

## 2015-09-02 DIAGNOSIS — R443 Hallucinations, unspecified: Secondary | ICD-10-CM | POA: Diagnosis present

## 2015-09-02 DIAGNOSIS — I509 Heart failure, unspecified: Secondary | ICD-10-CM

## 2015-09-02 DIAGNOSIS — J9601 Acute respiratory failure with hypoxia: Secondary | ICD-10-CM | POA: Diagnosis not present

## 2015-09-02 DIAGNOSIS — I340 Nonrheumatic mitral (valve) insufficiency: Secondary | ICD-10-CM | POA: Diagnosis present

## 2015-09-02 DIAGNOSIS — R0902 Hypoxemia: Secondary | ICD-10-CM | POA: Diagnosis not present

## 2015-09-02 DIAGNOSIS — Y92019 Unspecified place in single-family (private) house as the place of occurrence of the external cause: Secondary | ICD-10-CM | POA: Diagnosis not present

## 2015-09-02 DIAGNOSIS — J189 Pneumonia, unspecified organism: Secondary | ICD-10-CM

## 2015-09-02 DIAGNOSIS — Z823 Family history of stroke: Secondary | ICD-10-CM

## 2015-09-02 DIAGNOSIS — E890 Postprocedural hypothyroidism: Secondary | ICD-10-CM | POA: Diagnosis present

## 2015-09-02 DIAGNOSIS — R7989 Other specified abnormal findings of blood chemistry: Secondary | ICD-10-CM

## 2015-09-02 DIAGNOSIS — K3189 Other diseases of stomach and duodenum: Secondary | ICD-10-CM

## 2015-09-02 DIAGNOSIS — R739 Hyperglycemia, unspecified: Secondary | ICD-10-CM | POA: Diagnosis present

## 2015-09-02 DIAGNOSIS — R0602 Shortness of breath: Secondary | ICD-10-CM

## 2015-09-02 DIAGNOSIS — Z7982 Long term (current) use of aspirin: Secondary | ICD-10-CM | POA: Diagnosis not present

## 2015-09-02 DIAGNOSIS — Z923 Personal history of irradiation: Secondary | ICD-10-CM

## 2015-09-02 DIAGNOSIS — J4540 Moderate persistent asthma, uncomplicated: Secondary | ICD-10-CM | POA: Diagnosis present

## 2015-09-02 DIAGNOSIS — I7121 Aneurysm of the ascending aorta, without rupture: Secondary | ICD-10-CM

## 2015-09-02 LAB — CBC WITH DIFFERENTIAL/PLATELET
Basophils Absolute: 0 10*3/uL (ref 0.0–0.1)
Basophils Relative: 0 %
Eosinophils Absolute: 0.1 10*3/uL (ref 0.0–0.7)
Eosinophils Relative: 1 %
HCT: 29 % — ABNORMAL LOW (ref 39.0–52.0)
Hemoglobin: 9.1 g/dL — ABNORMAL LOW (ref 13.0–17.0)
Lymphocytes Relative: 6 %
Lymphs Abs: 1.1 10*3/uL (ref 0.7–4.0)
MCH: 26.8 pg (ref 26.0–34.0)
MCHC: 31.4 g/dL (ref 30.0–36.0)
MCV: 85.3 fL (ref 78.0–100.0)
Monocytes Absolute: 1.3 10*3/uL — ABNORMAL HIGH (ref 0.1–1.0)
Monocytes Relative: 7 %
Neutro Abs: 15.4 10*3/uL — ABNORMAL HIGH (ref 1.7–7.7)
Neutrophils Relative %: 86 %
Platelets: 361 10*3/uL (ref 150–400)
RBC: 3.4 MIL/uL — ABNORMAL LOW (ref 4.22–5.81)
RDW: 15.3 % (ref 11.5–15.5)
WBC: 18 10*3/uL — ABNORMAL HIGH (ref 4.0–10.5)

## 2015-09-02 LAB — I-STAT TROPONIN, ED: Troponin i, poc: 0.16 ng/mL (ref 0.00–0.08)

## 2015-09-02 LAB — COMPREHENSIVE METABOLIC PANEL
ALT: 28 U/L (ref 17–63)
AST: 30 U/L (ref 15–41)
Albumin: 2.8 g/dL — ABNORMAL LOW (ref 3.5–5.0)
Alkaline Phosphatase: 108 U/L (ref 38–126)
Anion gap: 13 (ref 5–15)
BUN: 23 mg/dL — ABNORMAL HIGH (ref 6–20)
CO2: 25 mmol/L (ref 22–32)
Calcium: 8.7 mg/dL — ABNORMAL LOW (ref 8.9–10.3)
Chloride: 97 mmol/L — ABNORMAL LOW (ref 101–111)
Creatinine, Ser: 1.5 mg/dL — ABNORMAL HIGH (ref 0.61–1.24)
GFR calc Af Amer: 54 mL/min — ABNORMAL LOW (ref >60)
GFR calc non Af Amer: 47 mL/min — ABNORMAL LOW (ref >60)
Glucose, Bld: 129 mg/dL — ABNORMAL HIGH (ref 65–99)
Potassium: 3.9 mmol/L (ref 3.5–5.1)
Sodium: 135 mmol/L (ref 135–145)
Total Bilirubin: 1.3 mg/dL — ABNORMAL HIGH (ref 0.3–1.2)
Total Protein: 6.5 g/dL (ref 6.5–8.1)

## 2015-09-02 LAB — TROPONIN I: Troponin I: 0.21 ng/mL — ABNORMAL HIGH (ref <0.031)

## 2015-09-02 LAB — BRAIN NATRIURETIC PEPTIDE: B Natriuretic Peptide: 206.2 pg/mL — ABNORMAL HIGH (ref 0.0–100.0)

## 2015-09-02 LAB — PROTIME-INR
INR: 2.08 — ABNORMAL HIGH (ref 0.00–1.49)
Prothrombin Time: 23.2 seconds — ABNORMAL HIGH (ref 11.6–15.2)

## 2015-09-02 MED ORDER — ASPIRIN EC 81 MG PO TBEC
81.0000 mg | DELAYED_RELEASE_TABLET | Freq: Every day | ORAL | Status: DC
  Administered 2015-09-03 – 2015-09-08 (×6): 81 mg via ORAL
  Filled 2015-09-02 (×7): qty 1

## 2015-09-02 MED ORDER — VANCOMYCIN HCL IN DEXTROSE 750-5 MG/150ML-% IV SOLN
750.0000 mg | Freq: Two times a day (BID) | INTRAVENOUS | Status: DC
  Filled 2015-09-02: qty 150

## 2015-09-02 MED ORDER — ALBUTEROL SULFATE HFA 108 (90 BASE) MCG/ACT IN AERS: 2.0000 | INHALATION_SPRAY | Freq: Four times a day (QID) | RESPIRATORY_TRACT | Status: DC | PRN

## 2015-09-02 MED ORDER — METHYLPREDNISOLONE SODIUM SUCC 125 MG IJ SOLR
125.0000 mg | Freq: Once | INTRAMUSCULAR | Status: AC
  Administered 2015-09-02: 125 mg via INTRAVENOUS
  Filled 2015-09-02: qty 2

## 2015-09-02 MED ORDER — DABIGATRAN ETEXILATE MESYLATE 150 MG PO CAPS: 150.0000 mg | ORAL_CAPSULE | Freq: Two times a day (BID) | ORAL | Status: DC

## 2015-09-02 MED ORDER — AMIODARONE HCL 200 MG PO TABS: 200.0000 mg | ORAL_TABLET | Freq: Two times a day (BID) | ORAL | Status: DC

## 2015-09-02 MED ORDER — ALBUTEROL SULFATE (2.5 MG/3ML) 0.083% IN NEBU
2.5000 mg | INHALATION_SOLUTION | Freq: Once | RESPIRATORY_TRACT | Status: AC
  Administered 2015-09-02: 2.5 mg via RESPIRATORY_TRACT

## 2015-09-02 MED ORDER — ALBUTEROL SULFATE (2.5 MG/3ML) 0.083% IN NEBU
INHALATION_SOLUTION | RESPIRATORY_TRACT | Status: AC
  Administered 2015-09-02: 2.5 mg via RESPIRATORY_TRACT
  Filled 2015-09-02: qty 6

## 2015-09-02 MED ORDER — HEPARIN (PORCINE) IN NACL 100-0.45 UNIT/ML-% IJ SOLN
1400.0000 [IU]/h | INTRAMUSCULAR | Status: DC
  Filled 2015-09-02: qty 250

## 2015-09-02 MED ORDER — MELATONIN-PYRIDOXINE ER 3-10 MG PO TBCR: 1.0000 | EXTENDED_RELEASE_TABLET | Freq: Every day | ORAL | Status: DC

## 2015-09-02 MED ORDER — PANTOPRAZOLE SODIUM 40 MG PO TBEC
40.0000 mg | DELAYED_RELEASE_TABLET | Freq: Every day | ORAL | Status: DC
  Administered 2015-09-03 – 2015-09-09 (×7): 40 mg via ORAL
  Filled 2015-09-02 (×7): qty 1

## 2015-09-02 MED ORDER — IRBESARTAN 150 MG PO TABS
150.0000 mg | ORAL_TABLET | Freq: Every day | ORAL | Status: DC
  Administered 2015-09-03 – 2015-09-09 (×6): 150 mg via ORAL
  Filled 2015-09-02 (×7): qty 1

## 2015-09-02 MED ORDER — GABAPENTIN 300 MG PO CAPS: 600.0000 mg | ORAL_CAPSULE | Freq: Every day | ORAL | Status: DC

## 2015-09-02 MED ORDER — AMIODARONE HCL 200 MG PO TABS
200.0000 mg | ORAL_TABLET | Freq: Two times a day (BID) | ORAL | Status: DC
  Administered 2015-09-03: 200 mg via ORAL
  Filled 2015-09-02: qty 1

## 2015-09-02 MED ORDER — AMLODIPINE BESYLATE 10 MG PO TABS
10.0000 mg | ORAL_TABLET | Freq: Every day | ORAL | Status: DC
  Administered 2015-09-03 – 2015-09-09 (×7): 10 mg via ORAL
  Filled 2015-09-02 (×7): qty 1

## 2015-09-02 MED ORDER — OXYCODONE HCL 5 MG PO TABS
5.0000 mg | ORAL_TABLET | ORAL | Status: DC | PRN
  Administered 2015-09-03: 5 mg via ORAL
  Filled 2015-09-02: qty 1

## 2015-09-02 MED ORDER — VALACYCLOVIR HCL 500 MG PO TABS
1000.0000 mg | ORAL_TABLET | Freq: Two times a day (BID) | ORAL | Status: DC | PRN
  Filled 2015-09-02: qty 2

## 2015-09-02 MED ORDER — METOPROLOL TARTRATE 25 MG PO TABS: 25.0000 mg | ORAL_TABLET | Freq: Two times a day (BID) | ORAL | Status: DC

## 2015-09-02 MED ORDER — PIPERACILLIN-TAZOBACTAM 3.375 G IVPB 30 MIN
3.3750 g | Freq: Once | INTRAVENOUS | Status: AC
Start: 2015-09-02 — End: 2015-09-02
  Administered 2015-09-02: 3.375 g via INTRAVENOUS
  Filled 2015-09-02: qty 50

## 2015-09-02 MED ORDER — FUROSEMIDE 10 MG/ML IJ SOLN
20.0000 mg | Freq: Once | INTRAMUSCULAR | Status: AC
  Administered 2015-09-02: 20 mg via INTRAVENOUS
  Filled 2015-09-02: qty 2

## 2015-09-02 MED ORDER — VANCOMYCIN HCL IN DEXTROSE 750-5 MG/150ML-% IV SOLN
750.0000 mg | Freq: Two times a day (BID) | INTRAVENOUS | Status: DC
  Administered 2015-09-03 (×2): 750 mg via INTRAVENOUS
  Filled 2015-09-02 (×3): qty 150

## 2015-09-02 MED ORDER — ONDANSETRON 4 MG PO TBDP: 4.0000 mg | ORAL_TABLET | Freq: Three times a day (TID) | ORAL | Status: DC | PRN

## 2015-09-02 MED ORDER — DEXTROSE 5 % IV SOLN
1.0000 g | Freq: Three times a day (TID) | INTRAVENOUS | Status: DC
  Administered 2015-09-03 – 2015-09-04 (×5): 1 g via INTRAVENOUS
  Filled 2015-09-02 (×7): qty 1

## 2015-09-02 MED ORDER — FUROSEMIDE 10 MG/ML IJ SOLN: 20.0000 mg | Freq: Two times a day (BID) | INTRAMUSCULAR | Status: DC

## 2015-09-02 MED ORDER — LEVOTHYROXINE SODIUM 75 MCG PO TABS
175.0000 ug | ORAL_TABLET | Freq: Every day | ORAL | Status: DC
  Administered 2015-09-03 – 2015-09-09 (×7): 175 ug via ORAL
  Filled 2015-09-02 (×7): qty 1

## 2015-09-02 MED ORDER — FLUTICASONE PROPIONATE 50 MCG/ACT NA SUSP
2.0000 | Freq: Two times a day (BID) | NASAL | Status: DC | PRN
  Filled 2015-09-02: qty 16

## 2015-09-02 MED ORDER — LORATADINE 10 MG PO TABS
10.0000 mg | ORAL_TABLET | Freq: Every day | ORAL | Status: DC
  Administered 2015-09-03 – 2015-09-09 (×7): 10 mg via ORAL
  Filled 2015-09-02 (×7): qty 1

## 2015-09-02 MED ORDER — ALBUTEROL SULFATE (2.5 MG/3ML) 0.083% IN NEBU
5.0000 mg | INHALATION_SOLUTION | Freq: Once | RESPIRATORY_TRACT | Status: AC
  Administered 2015-09-02: 5 mg via RESPIRATORY_TRACT
  Filled 2015-09-02: qty 6

## 2015-09-02 MED ORDER — DIPHENHYDRAMINE HCL 50 MG/ML IJ SOLN
INTRAMUSCULAR | Status: AC
  Administered 2015-09-02: 25 mg via INTRAVENOUS
  Filled 2015-09-02: qty 1

## 2015-09-02 MED ORDER — ROPINIROLE HCL 1 MG PO TABS
1.0000 mg | ORAL_TABLET | Freq: Every day | ORAL | Status: DC
  Administered 2015-09-03 – 2015-09-09 (×7): 1 mg via ORAL
  Filled 2015-09-02 (×7): qty 1

## 2015-09-02 MED ORDER — ATORVASTATIN CALCIUM 40 MG PO TABS: 40.0000 mg | ORAL_TABLET | Freq: Every day | ORAL | Status: DC

## 2015-09-02 MED ORDER — VANCOMYCIN HCL 10 G IV SOLR
2000.0000 mg | Freq: Once | INTRAVENOUS | Status: AC
  Administered 2015-09-02: 2000 mg via INTRAVENOUS
  Filled 2015-09-02: qty 2000

## 2015-09-02 MED ORDER — DIPHENHYDRAMINE HCL 50 MG/ML IJ SOLN
25.0000 mg | Freq: Once | INTRAMUSCULAR | Status: AC
  Administered 2015-09-02: 25 mg via INTRAVENOUS

## 2015-09-02 MED ORDER — TRAMADOL HCL 50 MG PO TABS
50.0000 mg | ORAL_TABLET | Freq: Every day | ORAL | Status: DC
  Administered 2015-09-03: 50 mg via ORAL
  Filled 2015-09-02: qty 1

## 2015-09-02 MED ORDER — SPIRONOLACTONE 25 MG PO TABS
12.5000 mg | ORAL_TABLET | Freq: Every day | ORAL | Status: DC
  Administered 2015-09-03 – 2015-09-09 (×6): 12.5 mg via ORAL
  Filled 2015-09-02 (×7): qty 1

## 2015-09-02 NOTE — ED Notes (Signed)
Patient presents stating he has been SOB for a couple of days and then it would get better but today it has gotten worse

## 2015-09-02 NOTE — ED Notes (Signed)
Called to patient bedside by wife of patient. Patient noted to be red in color, complaining of feeling hot, diaphoretic, and feeling some increased SOB upon entry. MD Alcario Drought informed and at bedside. MD advised this RN to hold Vancomycin infusion pending pharmacy consultation.

## 2015-09-02 NOTE — H&P (Signed)
History and Physical    ERMINE CORDY [id number] DOB: [birth year] DOA: 09/02/2015  Referring MD/NP/PA: Dr. Henry Russel PCP: Velna Hatchet, MD Outpatient Specialists: Dr. Prescott Gum cardiothoracic Patient coming from: Home  Chief Complaint: SOB  HPI: SINCER COLLIE is a 65 y.o. male with medical history significant of HTN, HLD, OSA, ventricular pacemaker, aortic stenosis s/p bioprosthetic valve replacement on 4/25, just discharged from hospital on 5/1.  Patient presents to the ED with worsening BLE edema, orthopnea, SOB, DOE.  Home pulse ox at rest reading 86% today.  No home O2 at baseline.  Patients wife convinced him to come in for the oxygen level.  No fever, no chills, does have cough productive of yellowish sputum.  ED Course: CXR reveals pleural effusion, infiltrate vs edema.  WBC 18k, no fever nor tachycardia, RR initially documented as 30, currently 23.  Review of Systems: As per HPI otherwise 10 point review of systems negative.    Past Medical History  Diagnosis Date  . Hypertension   . Hyperlipidemia   . Fibromyalgia   . Obesity   . Mitral regurgitation     trivial  . Aortic stenosis, mild   . Left atrial enlargement     LA size 78mm by echo 11/21/11  . Pacemaker 03/31/2012  . Heart murmur   . Asthma     "little bit" (03/31/2012)  . Exertional dyspnea   . Hypothyroidism     S/P radiation  . DJD (degenerative joint disease)   . Arthritis of hand     "just a little bit in both hands" (03/31/2012)  . Atrial fibrillation [facility])     dx '04; DCCV '04, placed on flecainide, failed DCCV 04/2010, flecainide stopped; s/p successful A.fib ablation 01/31/12  . First degree AV block     post ablation, ~435ms  . Second degree AV block   . GERD (gastroesophageal reflux disease)   . Restless leg syndrome   . Obstructive sleep apnea     mild by sleep study 2013; pt stated he does not have a machine because "it wasn't bad enough for him to have one"  . Diabetes mellitus  without complication ([place])     borderline    Past Surgical History  Procedure Laterality Date  . Finger tendon repair  1980's    "right little finger" (03/31/2012)  . US echocardiography  08/07/2009    EF 55-60%  . Doppler echocardiography  03/11/2002    EF 70-75%  . Cardiovascular stress test  03/12/2002    EF 48%, NO EVIDENCE OF ISCHEMIA  . Tee without cardioversion  01/30/2012    Procedure: TRANSESOPHAGEAL ECHOCARDIOGRAM (TEE);  Surgeon: Larey Dresser, MD;  Location: [place];  Service: Cardiovascular;  Laterality: N/A;  ablation next day  . Pacemaker insertion  03/31/2012    STJ Accent DR pacemaker implanted by Dr Rayann Heman  . Atrial fibrillation ablation  01/30/2012    PVI by Dr. Rayann Heman  . Lumbar disc surgery  1980's X2;  2000's  . Cardioversion  01/2012; 03/31/2012  . Atrial fibrillation ablation N/A 01/31/2012    Procedure: ATRIAL FIBRILLATION ABLATION;  Surgeon: Thompson Grayer, MD;  Location: [facility] CATH LAB;  Service: Cardiovascular;  Laterality: N/A;  . Cardioversion N/A 02/25/2012    Procedure: CARDIOVERSION;  Surgeon: Thompson Grayer, MD;  Location: [facility] CATH LAB;  Service: Cardiovascular;  Laterality: N/A;  . Permanent pacemaker insertion N/A 03/31/2012    Procedure: PERMANENT PACEMAKER INSERTION;  Surgeon: Thompson Grayer, MD;  Location: [facility] CATH LAB;  Service: Cardiovascular;  Laterality: N/A;  . Cardioversion N/A 03/31/2012    Procedure: CARDIOVERSION;  Surgeon: Thompson Grayer, MD;  Location: [facility] CATH LAB;  Service: Cardiovascular;  Laterality: N/A;  . Left and right heart catheterization with coronary angiogram N/A 10/27/2012    Procedure: LEFT AND RIGHT HEART CATHETERIZATION WITH CORONARY ANGIOGRAM;  Surgeon: Laverda Page, MD;  Location: [facility] CATH LAB;  Service: Cardiovascular;  Laterality: N/A;  . Cardiac catheterization N/A 08/09/2015    Procedure: Right/Left Heart Cath and Coronary Angiography;  Surgeon: Peter M Martinique, MD;  Location: [place] CV LAB;  Service: Cardiovascular;   Laterality: N/A;  . Back surgery      X 3  . Finger surgery Left     Middle finger  . Insert / replace / remove pacemaker      [employer]  . Aortic valve replacement N/A 08/22/2015    Procedure: AORTIC VALVE REPLACEMENT (AVR);  Surgeon: Ivin Poot, MD;  Location: [place];  Service: Open Heart Surgery;  Laterality: N/A;  . Maze N/A 08/22/2015    Procedure: MAZE;  Surgeon: Ivin Poot, MD;  Location: [place];  Service: Open Heart Surgery;  Laterality: N/A;  . Clipping of atrial appendage N/A 08/22/2015    Procedure: CLIPPING OF ATRIAL APPENDAGE;  Surgeon: Ivin Poot, MD;  Location: [place];  Service: Open Heart Surgery;  Laterality: N/A;  . Tee without cardioversion N/A 08/22/2015    Procedure: TRANSESOPHAGEAL ECHOCARDIOGRAM (TEE);  Surgeon: Ivin Poot, MD;  Location: [place];  Service: Open Heart Surgery;  Laterality: N/A;     reports that he quit smoking about 24 years ago. His smoking use included Cigarettes. He has a 7.5 pack-year smoking history. He has never used smokeless tobacco. He reports that he drinks alcohol. He reports that he uses illicit drugs (Marijuana).  Allergies  Allergen Reactions  . Meloxicam Rash    Family History  Problem Relation Age of Onset  . Heart failure Mother   . Stroke Father      Prior to Admission medications   Medication Sig Start Date End Date Taking? Authorizing Provider  albuterol (PROAIR HFA) 108 (90 BASE) MCG/ACT inhaler Inhale 2 puffs into the lungs every 6 (six) hours as needed for wheezing or shortness of breath. 09/20/14  Yes Collene Gobble, MD  amiodarone (PACERONE) 200 MG tablet Take 1 tablet (200 mg total) by mouth 2 (two) times daily. 08/28/15  Yes Erin R Barrett, PA-C  amLODipine (NORVASC) 10 MG tablet Take 1 tablet (10 mg total) by mouth daily. 08/28/15  Yes Erin R Barrett, PA-C  aspirin 81 MG EC tablet Take 1 tablet (81 mg total) by mouth daily. Swallow whole. 08/28/15  Yes Erin R Barrett, PA-C  atorvastatin (LIPITOR) 40 MG tablet  Take 1 tablet (40 mg total) by mouth daily. 07/19/15  Yes Thayer Headings, MD  Cholecalciferol (VITAMIN D) 2000 UNITS CAPS Take 2,000 Units by mouth daily.   Yes Historical Provider, MD  dabigatran (PRADAXA) 150 MG CAPS capsule Take 1 capsule (150 mg total) by mouth 2 (two) times daily. 08/28/15  Yes Erin R Barrett, PA-C  fexofenadine (ALLEGRA) 180 MG tablet Take 1 tablet (180 mg total) by mouth daily. 03/01/14  Yes Collene Gobble, MD  fluticasone (FLONASE) 50 MCG/ACT nasal spray Place 2 sprays into both nostrils 2 (two) times daily. Patient taking differently: Place 2 sprays into both nostrils 2 (two) times daily as needed for allergies.  03/01/14  Yes Herbie Baltimore  Agustina Caroli, MD  gabapentin (NEURONTIN) 300 MG capsule Take 600 capsules by mouth at bedtime.  09/13/14  Yes Historical Provider, MD  levothyroxine (SYNTHROID, LEVOTHROID) 175 MCG tablet Take 175 mcg by mouth daily before breakfast.   Yes Historical Provider, MD  Melatonin-Pyridoxine (MELATONEX) 3-10 MG TBCR Take 1 tablet by mouth at bedtime.   Yes Historical Provider, MD  metoprolol tartrate (LOPRESSOR) 25 MG tablet Take 1 tablet (25 mg total) by mouth 2 (two) times daily. 08/28/15  Yes Erin R Barrett, PA-C  ondansetron (ZOFRAN ODT) 4 MG disintegrating tablet Take 1 tablet (4 mg total) by mouth every 8 (eight) hours as needed for nausea or vomiting. 05/19/14  Yes Alvina Chou, PA-C  oxyCODONE (OXY IR/ROXICODONE) 5 MG immediate release tablet Take 1-2 tablets (5-10 mg total) by mouth every 3 (three) hours as needed for severe pain. 08/28/15  Yes Erin R Barrett, PA-C  pantoprazole (PROTONIX) 40 MG tablet Take 40 mg by mouth daily.   Yes Historical Provider, MD  rOPINIRole (REQUIP) 1 MG tablet Take 1 tablet (1 mg total) by mouth daily. 12/24/13  Yes Collene Gobble, MD  spironolactone (ALDACTONE) 25 MG tablet Take 0.5 tablets (12.5 mg total) by mouth daily. 08/03/15  Yes Thayer Headings, MD  traMADol (ULTRAM) 50 MG tablet Take 1 tablet by mouth daily.  10/01/13   Yes Historical Provider, MD  valACYclovir (VALTREX) 1000 MG tablet Take 1 tablet by mouth every 12 (twelve) hours as needed (fever blisters).  04/13/13  Yes Historical Provider, MD  valsartan (DIOVAN) 160 MG tablet Take 160 mg by mouth daily.   Yes Historical Provider, MD    Physical Exam: Filed Vitals:   09/02/15 2030 09/02/15 2121 09/02/15 2130 09/02/15 2230  BP: 133/64  135/69 138/76  Pulse: 70 70 70 70  Temp:      TempSrc:      Resp: 17 30 30 23   Weight:      SpO2: 99% 91% 92% 92%      Constitutional: NAD, calm, comfortable Filed Vitals:   09/02/15 2030 09/02/15 2121 09/02/15 2130 09/02/15 2230  BP: 133/64  135/69 138/76  Pulse: 70 70 70 70  Temp:      TempSrc:      Resp: 17 30 30 23   Weight:      SpO2: 99% 91% 92% 92%   Eyes: PERRL, lids and conjunctivae normal ENMT: Mucous membranes are moist. Posterior pharynx clear of any exudate or lesions.Normal dentition.  Neck: normal, supple, no masses, no thyromegaly Respiratory: minimal accessory muscle use, does have wheezing, crackles in bases Cardiovascular: Regular rate and rhythm, no murmurs / rubs / gallops. 2+ BLE edema. 2+ pedal pulses. No carotid bruits.  Abdomen: no tenderness, no masses palpated. No hepatosplenomegaly. Bowel sounds positive.  Musculoskeletal: no clubbing / cyanosis. No joint deformity upper and lower extremities. Good ROM, no contractures. Normal muscle tone.  Skin: no rashes, lesions, ulcers. No induration Neurologic: CN 2-12 grossly intact. Sensation intact, DTR normal. Strength 5/5 in all 4.  Psychiatric: Normal judgment and insight. Alert and oriented x 3. Normal mood.    Labs on Admission: I have personally reviewed following labs and imaging studies  CBC:  Recent Labs Lab 08/27/15 0343 09/02/15 2005  WBC 12.9* 18.0*  NEUTROABS  --  15.4*  HGB 9.6* 9.1*  HCT 29.6* 29.0*  MCV 85.8 85.3  PLT 233 [id number]   Basic Metabolic Panel:  Recent Labs Lab 08/27/15 0343 08/28/15 0301  09/02/15 2005  NA 135  137 135  K 3.2* 3.1* 3.9  CL 98* 97* 97*  CO2 25 28 25   GLUCOSE 119* 90 129*  BUN 39* 31* 23*  CREATININE 1.66* 1.47* 1.50*  CALCIUM 8.4* 8.6* 8.7*   GFR: Estimated Creatinine Clearance: 57.8 mL/min (by C-G formula based on Cr of 1.5). Liver Function Tests:  Recent Labs Lab 09/02/15 2005  AST 30  ALT 28  ALKPHOS 108  BILITOT 1.3*  PROT 6.5  ALBUMIN 2.8*   No results for input(s): LIPASE, AMYLASE in the last 168 hours. No results for input(s): AMMONIA in the last 168 hours. Coagulation Profile:  Recent Labs Lab 09/02/15 2011  INR 2.08*   Cardiac Enzymes:  Recent Labs Lab 09/02/15 2012  TROPONINI 0.21*   BNP (last 3 results) No results for input(s): PROBNP in the last 8760 hours. HbA1C: No results for input(s): HGBA1C in the last 72 hours. CBG:  Recent Labs Lab 08/27/15 1121 08/27/15 1652 08/27/15 2206 08/28/15 0641 08/28/15 1122  GLUCAP 154* 155* 109* 104* 177*   Lipid Profile: No results for input(s): CHOL, HDL, LDLCALC, TRIG, CHOLHDL, LDLDIRECT in the last 72 hours. Thyroid Function Tests: No results for input(s): TSH, T4TOTAL, FREET4, T3FREE, THYROIDAB in the last 72 hours. Anemia Panel: No results for input(s): VITAMINB12, FOLATE, FERRITIN, TIBC, IRON, RETICCTPCT in the last 72 hours. Urine analysis:    Component Value Date/Time   COLORURINE YELLOW 08/18/2015 [place] 08/18/2015 0835   LABSPEC 1.018 08/18/2015 0835   PHURINE 5.5 08/18/2015 0835   GLUCOSEU NEGATIVE 08/18/2015 0835   HGBUR NEGATIVE 08/18/2015 0835   BILIRUBINUR NEGATIVE 08/18/2015 0835   KETONESUR NEGATIVE 08/18/2015 0835   PROTEINUR NEGATIVE 08/18/2015 0835   NITRITE NEGATIVE 08/18/2015 0835   LEUKOCYTESUR NEGATIVE 08/18/2015 0835   Sepsis Labs: @LABRCNTIP (procalcitonin:4,lacticidven:4) )No results found for this or any previous visit (from the past 240 hour(s)).   Radiological Exams on Admission: Dg Chest 2 View  09/02/2015   CLINICAL DATA:  65 year old male with a history of shortness of breath. Surgery 08/22/2015 with aortic valve replacement and 25 mm pericardial valve, left atrial clipping, left atrial Maze procedure EXAM: CHEST - 2 VIEW COMPARISON:  08/27/2015 FINDINGS: Cardiomediastinal silhouette likely unchanged though partially obscured by overlying lung and pleural disease on the right. Surgical changes of prior median sternotomy, aortic valve replacement, and atrial amputation. Cardiac pacing device on left chest wall unchanged. Previous pneumothorax is not visualized. Interstitial opacities with interlobular septal thickening and increasing right-sided pleural effusion. Opacity at the right base obscures the right hemidiaphragm and right heart border. No displaced fracture. Unremarkable appearance of the upper abdomen. IMPRESSION: Bilateral interstitial opacities and interlobular septal thickening with right greater than left basilar opacity, potentially a combination of edema, atelectasis, and/or consolidation. Prior right-sided pneumothorax not visualized on the current study. Surgical changes of median sternotomy, aortic valve replacement, and left atrial amputation. Signed, Dulcy Fanny. Earleen Newport, DO [facility] Electronically Signed   By: Corrie Mckusick D.O.   On: 09/02/2015 21:07    EKG: Independently reviewed.  Assessment/Plan Principal Problem:   Hypoxia Active Problems:   Hypertension   S/P aortic valve replacement with bioprosthetic valve   Acute CHF (congestive heart failure) (HCC)   Pleural effusion   Hypoxia - Acute CHF possibly with component of PNA (unclear)  Due to WBC of 18k and because Dr. Prescott Gum instructs to do so, will keep patient on antibiotics for the moment, cefepime and vanc ordered  PNA pathway  Cultures pending  No evidence of DSWI at this time  Acute CHF -  Lasix 20mg  IV now, and 20mg  IV BID ordered  CHF pathway  2d  echo  Already on ARB, beta blocker  Serial trops ordered to trend  HTN - Continue home meds  Pleural effusion -  no evidence of loculation on CXR  Dr. Prescott Gum coming to consult on patient tomorrow, will hold off on ordering any sort of thoracentesis at this time.   DVT prophylaxis: SCDs only, holding Pradaxa at Dr. Lucianne Lei Trigt's instructions Code Status: Full Family Communication: Family at bedside Consults called: Dr. Prescott Gum will see in AM Admission status: Admit to inpatient   Etta Quill DO [facility] Pager [phone] from 7PM-7AM  If 7AM-7PM, please contact the day physician for the patient [email or web address] Password [email or web address]  09/02/2015, 11:00 PM

## 2015-09-02 NOTE — Progress Notes (Signed)
ANTICOAGULATION CONSULT NOTE - Initial Consult  Pharmacy Consult for Heparin Indication: afib  Allergies  Allergen Reactions  . Meloxicam Rash    Patient Measurements: Weight: 216 lb (97.977 kg) Heparin Dosing Weight: 98 kg  Vital Signs: Temp: 97.7 F (36.5 C) (05/06 2000) Temp Source: Oral (05/06 2000) BP: 138/76 mmHg (05/06 2230) Pulse Rate: 70 (05/06 2230)  Labs:  Recent Labs  09/02/15 2005 09/02/15 2011 09/02/15 2012  HGB 9.1*  --   --   HCT 29.0*  --   --   PLT 361  --   --   LABPROT  --  23.2*  --   INR  --  2.08*  --   CREATININE 1.50*  --   --   TROPONINI  --   --  0.21*    Estimated Creatinine Clearance: 57.8 mL/min (by C-G formula based on Cr of 1.5).   Medical History: Past Medical History  Diagnosis Date  . Hypertension   . Hyperlipidemia   . Fibromyalgia   . Obesity   . Mitral regurgitation     trivial  . Aortic stenosis, mild   . Left atrial enlargement     LA size 28mm by echo 11/21/11  . Pacemaker 03/31/2012  . Heart murmur   . Asthma     "little bit" (03/31/2012)  . Exertional dyspnea   . Hypothyroidism     S/P radiation  . DJD (degenerative joint disease)   . Arthritis of hand     "just a little bit in both hands" (03/31/2012)  . Atrial fibrillation [facility])     dx '04; DCCV '04, placed on flecainide, failed DCCV 04/2010, flecainide stopped; s/p successful A.fib ablation 01/31/12  . 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 Second degree AV block   . GERD (gastroesophageal reflux disease)   . Restless leg syndrome   . Obstructive sleep apnea     mild by sleep study 2013; pt stated he does not have a machine because "it wasn't bad enough for him to have one"  . Diabetes mellitus without complication ([place])     borderline    Medications:  See electronic med rec  Assessment: 65 y.o. M presents with SOB. Pt on pradaxa 150mg  BID PTA for afib - last dose taken 5/6 1800. Pt with recent AV tissue valve replacement 4/25 -  discharged home 5/1. Holding pradaxa for now and starting heparin bridge. Hgb 9.1 (down a bit from last admission). Estimated CrCl 57 ml/min.  Goal of Therapy:  Heparin level 0.3-0.7 units/ml Monitor platelets by anticoagulation protocol: Yes   Plan:  At 0600 on 5/7, begin heparin gtt at 1400 units/hr. No bolus. Will f/u heparin level 6 hours post start Daily heparin level and CBC  Sherlon Handing, PharmD, BCPS Clinical pharmacist, pager [phone] 09/02/2015,10:41 PM

## 2015-09-02 NOTE — ED Notes (Signed)
Pt 91% on 3L, o2 increased to 4L

## 2015-09-02 NOTE — ED Notes (Signed)
Spoke with pharmacy, recommends restarting Vanc. At 141ml/hr after benadryl, and premedicating in the future.

## 2015-09-02 NOTE — Progress Notes (Signed)
Pharmacy Antibiotic Note  [name] is a 65 y.o. male admitted on 09/02/2015 with pneumonia.  Pharmacy has been consulted for vancomycin and cefepime dosing. SCr 1.5 on admit, CrCl~50-60 (has been between 1.1-1.6 in last ~39mo).  Plan: Continue Vancomycin 750mg  IV q12h. Goal 15-20 Cefepime 1gm IV q8h Monitor clinical progress, c/s, renal function, abx plan/LOT VT@SS  as indicated    Weight: 216 lb (97.977 kg)  Temp (24hrs), Avg:97.7 F (36.5 C), Min:97.7 F (36.5 C), Max:97.7 F (36.5 C)   Recent Labs Lab 08/27/15 0343 08/28/15 0301 09/02/15 2005  WBC 12.9*  --  18.0*  CREATININE 1.66* 1.47* 1.50*    Estimated Creatinine Clearance: 57.8 mL/min (by C-G formula based on Cr of 1.5).    Allergies  Allergen Reactions  . Meloxicam Rash    Antimicrobials this admission: 5/6 vanc >>  5/6 zosyn x1 5/6 cefepime>>  Dose adjustments this admission: n/a  Microbiology results: 5/6 BCx x2:   Sherlon Handing, PharmD, BCPS Clinical pharmacist, pager [phone] 09/02/2015 11:23 PM

## 2015-09-02 NOTE — Progress Notes (Signed)
Pharmacy Antibiotic Note  [name] is a 65 y.o. male admitted on 09/02/2015 with pneumonia.  Pharmacy has been consulted for vancomycin dosing. AF, wbc 18, SCr 1.5 on admit, CrCl~50-60 (has been between 1.1-1.6 in last ~46mo). Zosyn ordered x1 per MD in the ED.  Plan: Vancomycin 2g IV x1; then 750mg  IV q12h. Goal 15-20 Monitor clinical progress, c/s, renal function, abx plan/LOT VT@SS  as indicated F/u need to continue Zosyn?   Weight: 216 lb (97.977 kg)  Temp (24hrs), Avg:97.7 F (36.5 C), Min:97.7 F (36.5 C), Max:97.7 F (36.5 C)   Recent Labs Lab 08/27/15 0343 08/28/15 0301 09/02/15 2005  WBC 12.9*  --  18.0*  CREATININE 1.66* 1.47*  --     Estimated Creatinine Clearance: 59 mL/min (by C-G formula based on Cr of 1.47).    Allergies  Allergen Reactions  . Meloxicam Rash    Antimicrobials this admission: 5/6 vanc >>  5/6 zosyn x1  Dose adjustments this admission: n/a  Microbiology results: 5/6 BCx:    Elicia Lamp, PharmD, [facility] Clinical Pharmacist Pager [phone] 09/02/2015 9:16 PM

## 2015-09-02 NOTE — ED Notes (Signed)
Oxygen applied [place] at 3 liters

## 2015-09-02 NOTE — ED Notes (Addendum)
Patient had valve replacement 08/22/15

## 2015-09-02 NOTE — ED Provider Notes (Signed)
CSN: [id number]     Arrival date & time 09/02/15  1953 History   First MD Initiated Contact with Patient 09/02/15 2006     Chief Complaint  Patient presents with  . Shortness of Breath    HPI   Patient presents with shortness of breath. Context of recent aortic valve replacement 4/25. He has past medical history sig for HTN, HLD, OSA, ventricular pacemaker, aortic stenosis. Compliant with AC.  Since the surgery has noted bilateral lower extremity edema and intermittent shortness of breath. Symptoms worsened today. Home pulse oximetry reading read 86%. He has no home O2 requirement at baseline. He endorses cough with brownish yellow sputum and wheezing. He feels like his prior asthma exacerbations. No weight gain. No orthopnea. unchanged dyspnea on exertion. He denies chest pain at this time. No hemoptysis. Shortness of breath sometimes worsens with weather changes. No f/c. Normal UOP. No increased salt intake.   Past Medical History  Diagnosis Date  . Hypertension   . Hyperlipidemia   . Fibromyalgia   . Obesity   . Mitral regurgitation     trivial  . Aortic stenosis, mild   . Left atrial enlargement     LA size 76mm by echo 11/21/11  . Pacemaker 03/31/2012  . Heart murmur   . Asthma     "little bit" (03/31/2012)  . Exertional dyspnea   . Hypothyroidism     S/P radiation  . DJD (degenerative joint disease)   . Arthritis of hand     "just a little bit in both hands" (03/31/2012)  . Atrial fibrillation [facility])     dx '04; DCCV '04, placed on flecainide, failed DCCV 04/2010, flecainide stopped; s/p successful A.fib ablation 01/31/12  . First degree AV block     post ablation, ~482ms  . Second degree AV block   . GERD (gastroesophageal reflux disease)   . Restless leg syndrome   . Obstructive sleep apnea     mild by sleep study 2013; pt stated he does not have a machine because "it wasn't bad enough for him to have one"  . Diabetes mellitus without complication ([place])     borderline    Past Surgical History  Procedure Laterality Date  . Finger tendon repair  1980's    "right little finger" (03/31/2012)  . US echocardiography  08/07/2009    EF 55-60%  . Doppler echocardiography  03/11/2002    EF 70-75%  . Cardiovascular stress test  03/12/2002    EF 48%, NO EVIDENCE OF ISCHEMIA  . Tee without cardioversion  01/30/2012    Procedure: TRANSESOPHAGEAL ECHOCARDIOGRAM (TEE);  Surgeon: Larey Dresser, MD;  Location: [place];  Service: Cardiovascular;  Laterality: N/A;  ablation next day  . Pacemaker insertion  03/31/2012    STJ Accent DR pacemaker implanted by Dr Rayann Heman  . Atrial fibrillation ablation  01/30/2012    PVI by Dr. Rayann Heman  . Lumbar disc surgery  1980's X2;  2000's  . Cardioversion  01/2012; 03/31/2012  . Atrial fibrillation ablation N/A 01/31/2012    Procedure: ATRIAL FIBRILLATION ABLATION;  Surgeon: Thompson Grayer, MD;  Location: [facility] CATH LAB;  Service: Cardiovascular;  Laterality: N/A;  . Cardioversion N/A 02/25/2012    Procedure: CARDIOVERSION;  Surgeon: Thompson Grayer, MD;  Location: [facility] CATH LAB;  Service: Cardiovascular;  Laterality: N/A;  . Permanent pacemaker insertion N/A 03/31/2012    Procedure: PERMANENT PACEMAKER INSERTION;  Surgeon: Thompson Grayer, MD;  Location: [facility] CATH LAB;  Service: Cardiovascular;  Laterality: N/A;  .  Cardioversion N/A 03/31/2012    Procedure: CARDIOVERSION;  Surgeon: Thompson Grayer, MD;  Location: [facility] CATH LAB;  Service: Cardiovascular;  Laterality: N/A;  . Left and right heart catheterization with coronary angiogram N/A 10/27/2012    Procedure: LEFT AND RIGHT HEART CATHETERIZATION WITH CORONARY ANGIOGRAM;  Surgeon: Laverda Page, MD;  Location[facility] LAB;  Service: Cardiovascular;  Laterality: N/A;  . Cardiac catheterization N/A 08/09/2015    Procedure: Right/Left Heart Cath and Coronary Angiography;  Surgeon: Peter M Martinique, MD;  Location: [place] CV LAB;  Service: Cardiovascular;  Laterality: N/A;  . Back surgery      X 3  .  Finger surgery Left     Middle finger  . Insert / replace / remove pacemaker      [employer] Jude  . Aortic valve replacement N/A 08/22/2015    Procedure: AORTIC VALVE REPLACEMENT (AVR);  Surgeon: Ivin Poot, MD;  Location: [place];  Service: Open Heart Surgery;  Laterality: N/A;  . Maze N/A 08/22/2015    Procedure: MAZE;  Surgeon: Ivin Poot, MD;  Location: [place];  Service: Open Heart Surgery;  Laterality: N/A;  . Clipping of atrial appendage N/A 08/22/2015    Procedure: CLIPPING OF ATRIAL APPENDAGE;  Surgeon: Ivin Poot, MD;  Location: [place];  Service: Open Heart Surgery;  Laterality: N/A;  . Tee without cardioversion N/A 08/22/2015    Procedure: TRANSESOPHAGEAL ECHOCARDIOGRAM (TEE);  Surgeon: Ivin Poot, MD;  Location: [place];  Service: Open Heart Surgery;  Laterality: N/A;   Family History  Problem Relation Age of Onset  . Heart failure Mother   . Stroke Father    Social History  Substance Use Topics  . Smoking status: Former Smoker -- 0.50 packs/day for 15 years    Types: Cigarettes    Quit date: 07/18/1991  . Smokeless tobacco: Never Used  . Alcohol Use: Yes     Comment: occasional    Review of Systems  Constitutional: Positive for fatigue. Negative for fever and chills.  HENT: Negative for congestion.   Respiratory: Positive for cough, shortness of breath and wheezing. Negative for chest tightness and stridor.   Cardiovascular: Positive for leg swelling. Negative for chest pain.  Gastrointestinal: Positive for nausea. Negative for vomiting and abdominal pain.  Musculoskeletal: Negative for back pain.  Skin: Negative for pallor.  Neurological: Negative for syncope.  Psychiatric/Behavioral: Positive for confusion (intermittent, improved now per wife).  All other systems reviewed and are negative.     Allergies  Meloxicam  Home Medications   Prior to Admission medications   Medication Sig Start Date End Date Taking? Authorizing Provider  albuterol (PROAIR  HFA) 108 (90 BASE) MCG/ACT inhaler Inhale 2 puffs into the lungs every 6 (six) hours as needed for wheezing or shortness of breath. 09/20/14  Yes Collene Gobble, MD  amiodarone (PACERONE) 200 MG tablet Take 1 tablet (200 mg total) by mouth 2 (two) times daily. 08/28/15  Yes Erin R Barrett, PA-C  amLODipine (NORVASC) 10 MG tablet Take 1 tablet (10 mg total) by mouth daily. 08/28/15  Yes Erin R Barrett, PA-C  aspirin 81 MG EC tablet Take 1 tablet (81 mg total) by mouth daily. Swallow whole. 08/28/15  Yes Erin R Barrett, PA-C  atorvastatin (LIPITOR) 40 MG tablet Take 1 tablet (40 mg total) by mouth daily. 07/19/15  Yes Thayer Headings, MD  Cholecalciferol (VITAMIN D) 2000 UNITS CAPS Take 2,000 Units by mouth daily.   Yes Historical Provider,  MD  dabigatran (PRADAXA) 150 MG CAPS capsule Take 1 capsule (150 mg total) by mouth 2 (two) times daily. 08/28/15  Yes Erin R Barrett, PA-C  fexofenadine (ALLEGRA) 180 MG tablet Take 1 tablet (180 mg total) by mouth daily. 03/01/14  Yes Collene Gobble, MD  fluticasone (FLONASE) 50 MCG/ACT nasal spray Place 2 sprays into both nostrils 2 (two) times daily. Patient taking differently: Place 2 sprays into both nostrils 2 (two) times daily as needed for allergies.  03/01/14  Yes Collene Gobble, MD  gabapentin (NEURONTIN) 300 MG capsule Take 600 capsules by mouth at bedtime.  09/13/14  Yes Historical Provider, MD  levothyroxine (SYNTHROID, LEVOTHROID) 175 MCG tablet Take 175 mcg by mouth daily before breakfast.   Yes Historical Provider, MD  Melatonin-Pyridoxine (MELATONEX) 3-10 MG TBCR Take 1 tablet by mouth at bedtime.   Yes Historical Provider, MD  metoprolol tartrate (LOPRESSOR) 25 MG tablet Take 1 tablet (25 mg total) by mouth 2 (two) times daily. 08/28/15  Yes Erin R Barrett, PA-C  ondansetron (ZOFRAN ODT) 4 MG disintegrating tablet Take 1 tablet (4 mg total) by mouth every 8 (eight) hours as needed for nausea or vomiting. 05/19/14  Yes Alvina Chou, PA-C  oxyCODONE (OXY  IR/ROXICODONE) 5 MG immediate release tablet Take 1-2 tablets (5-10 mg total) by mouth every 3 (three) hours as needed for severe pain. 08/28/15  Yes Erin R Barrett, PA-C  pantoprazole (PROTONIX) 40 MG tablet Take 40 mg by mouth daily.   Yes Historical Provider, MD  rOPINIRole (REQUIP) 1 MG tablet Take 1 tablet (1 mg total) by mouth daily. 12/24/13  Yes Collene Gobble, MD  spironolactone (ALDACTONE) 25 MG tablet Take 0.5 tablets (12.5 mg total) by mouth daily. 08/03/15  Yes Thayer Headings, MD  traMADol (ULTRAM) 50 MG tablet Take 1 tablet by mouth daily.  10/01/13  Yes Historical Provider, MD  valACYclovir (VALTREX) 1000 MG tablet Take 1 tablet by mouth every 12 (twelve) hours as needed (fever blisters).  04/13/13  Yes Historical Provider, MD  valsartan (DIOVAN) 160 MG tablet Take 160 mg by mouth daily.   Yes Historical Provider, MD   BP 138/76 mmHg  Pulse 70  Temp(Src) 97.7 F (36.5 C) (Oral)  Resp 23  Wt 97.977 kg  SpO2 92% Physical Exam  Constitutional: He is oriented to person, place, and time. He appears well-developed and well-nourished. No distress.  HENT:  Head: Normocephalic and atraumatic.  Nose: Nose normal.  Eyes: Conjunctivae are normal. Pupils are equal, round, and reactive to light. No scleral icterus.  No conj pallor  Neck: Normal range of motion. Neck supple. No JVD (unabel to aprreciate) present. No tracheal deviation present.  Cardiovascular: Normal rate, regular rhythm and normal heart sounds.   No murmur (none that i can hear) heard. Pulmonary/Chest: He is in respiratory distress. He has wheezes (end exp diffusely). He has no rales.  Poor air entry bialterally. speaking in partial to full snetences.  No retractions  Abdominal: Soft. Bowel sounds are normal. He exhibits no distension and no mass. There is no tenderness.  Musculoskeletal: Normal range of motion. He exhibits edema (2+ bilateral to mid calves. no ttp or cords or wamrth or ertyhema).  Neurological: He is alert  and oriented to person, place, and time.  Skin: Skin is warm and dry. No rash noted.  Sternotomy wound cdi  Psychiatric: He has a normal mood and affect.  Nursing note and vitals reviewed.   ED Course  Procedures (including  critical care time) Labs Review Labs Reviewed  CBC WITH DIFFERENTIAL/PLATELET - Abnormal; Notable for the following:    WBC 18.0 (*)    RBC 3.40 (*)    Hemoglobin 9.1 (*)    HCT 29.0 (*)    Neutro Abs 15.4 (*)    Monocytes Absolute 1.3 (*)    All other components within normal limits  COMPREHENSIVE METABOLIC PANEL - Abnormal; Notable for the following:    Chloride 97 (*)    Glucose, Bld 129 (*)    BUN 23 (*)    Creatinine, Ser 1.50 (*)    Calcium 8.7 (*)    Albumin 2.8 (*)    Total Bilirubin 1.3 (*)    GFR calc non Af Amer 47 (*)    GFR calc Af Amer 54 (*)    All other components within normal limits  BRAIN NATRIURETIC PEPTIDE - Abnormal; Notable for the following:    B Natriuretic Peptide 206.2 (*)    All other components within normal limits  PROTIME-INR - Abnormal; Notable for the following:    Prothrombin Time 23.2 (*)    INR 2.08 (*)    All other components within normal limits  TROPONIN I - Abnormal; Notable for the following:    Troponin I 0.21 (*)    All other components within normal limits  I-STAT TROPOININ, ED - Abnormal; Notable for the following:    Troponin i, poc 0.16 (*)    All other components within normal limits  CULTURE, BLOOD (ROUTINE X 2)  CULTURE, BLOOD (ROUTINE X 2)    Imaging Review Dg Chest 2 View  09/02/2015  CLINICAL DATA:  65 year old male with a history of shortness of breath. Surgery 08/22/2015 with aortic valve replacement and 25 mm pericardial valve, left atrial clipping, left atrial Maze procedure EXAM: CHEST - 2 VIEW COMPARISON:  08/27/2015 FINDINGS: Cardiomediastinal silhouette likely unchanged though partially obscured by overlying lung and pleural disease on the right. Surgical changes of prior median  sternotomy, aortic valve replacement, and atrial amputation. Cardiac pacing device on left chest wall unchanged. Previous pneumothorax is not visualized. Interstitial opacities with interlobular septal thickening and increasing right-sided pleural effusion. Opacity at the right base obscures the right hemidiaphragm and right heart border. No displaced fracture. Unremarkable appearance of the upper abdomen. IMPRESSION: Bilateral interstitial opacities and interlobular septal thickening with right greater than left basilar opacity, potentially a combination of edema, atelectasis, and/or consolidation. Prior right-sided pneumothorax not visualized on the current study. Surgical changes of median sternotomy, aortic valve replacement, and left atrial amputation. Signed, Dulcy Fanny. Earleen Newport, DO [facility] Radiology Electronically Signed   By: Corrie Mckusick D.O.   On: 09/02/2015 21:07   I have personally reviewed and evaluated these images and lab results as part of my medical decision-making.   EKG Interpretation   Date/Time:  Saturday Sep 02 2015 19:59:30 EDT Ventricular Rate:  70 PR Interval:    QRS Duration: 200 QT Interval:  580 QTC Calculation: 626 R Axis:   -60 Text Interpretation:  Ventricular-paced rhythm Abnormal ECG Confirmed by  RAY MD, Andee Poles [id number]) on 09/02/2015 8:03:32 PM      MDM   Final diagnoses:  S/P AVR  Hypoxia  HCAP (healthcare-associated pneumonia)  Acute pulmonary edema (HCC)  Elevated troponin   Reviewed recent hospital course involving AVR and intubation.    O2 90% in triage. On 3LN to keep sats in low 90s. Wheezing, poor air entry, hx of asthma, initial dx  pulm edema/infxn/reactive airway.  Improved with bronchodilators, gave steroids in case this was reactive airway.   CXR with bilateral patchy infiltrates could represent PNA or pulm edema.  ABX given to cover for VAP/HCAP.  Wound site post op cdi.  Troponin elevated,  will trend.  Could be demand related from hypoxia vs post op downtrend.    Spoke to CTS Dr Nils Pyle who has reviewed results.  Plan to admit to medicine service, continue abx and start gentle diuresis. Hold pradaxa for now until TTE..    Dr Alcario Drought to admit, CTS to see in  AM.     11:19 PM notified pt was having a hard time breathing.  He had acute onset of dyspnea with redness of skin and mild diaphoresis.  This lasted only a few minutes and he started feeling better. Will stop vanc for now.  Give benadryl.  Lungs tight, end exp wheezing, sounds like he did before the albuterol earlier in evening so will redose.  Speaking in full sentences.  No throat sensation. Dr Alcario Drought at bedside.    Tammy Sours, MD 09/03/15 [id number]  Pattricia Boss, MD 09/04/15 0001

## 2015-09-03 ENCOUNTER — Inpatient Hospital Stay (HOSPITAL_COMMUNITY): Payer: Medicare Other

## 2015-09-03 ENCOUNTER — Encounter (HOSPITAL_COMMUNITY): Payer: Self-pay | Admitting: *Deleted

## 2015-09-03 DIAGNOSIS — Z954 Presence of other heart-valve replacement: Secondary | ICD-10-CM

## 2015-09-03 DIAGNOSIS — J90 Pleural effusion, not elsewhere classified: Secondary | ICD-10-CM

## 2015-09-03 DIAGNOSIS — I509 Heart failure, unspecified: Secondary | ICD-10-CM

## 2015-09-03 DIAGNOSIS — I10 Essential (primary) hypertension: Secondary | ICD-10-CM

## 2015-09-03 DIAGNOSIS — I340 Nonrheumatic mitral (valve) insufficiency: Secondary | ICD-10-CM

## 2015-09-03 LAB — URINALYSIS, ROUTINE W REFLEX MICROSCOPIC
Bilirubin Urine: NEGATIVE
Glucose, UA: NEGATIVE mg/dL
Hgb urine dipstick: NEGATIVE
Ketones, ur: NEGATIVE mg/dL
Leukocytes, UA: NEGATIVE
Nitrite: NEGATIVE
Protein, ur: NEGATIVE mg/dL
Specific Gravity, Urine: 1.015 (ref 1.005–1.030)
pH: 5.5 (ref 5.0–8.0)

## 2015-09-03 LAB — BASIC METABOLIC PANEL
Anion gap: 14 (ref 5–15)
BUN: 25 mg/dL — ABNORMAL HIGH (ref 6–20)
CO2: 25 mmol/L (ref 22–32)
Calcium: 8.6 mg/dL — ABNORMAL LOW (ref 8.9–10.3)
Chloride: 96 mmol/L — ABNORMAL LOW (ref 101–111)
Creatinine, Ser: 1.58 mg/dL — ABNORMAL HIGH (ref 0.61–1.24)
GFR calc Af Amer: 51 mL/min — ABNORMAL LOW (ref >60)
GFR calc non Af Amer: 44 mL/min — ABNORMAL LOW (ref >60)
Glucose, Bld: 171 mg/dL — ABNORMAL HIGH (ref 65–99)
Potassium: 3.7 mmol/L (ref 3.5–5.1)
Sodium: 135 mmol/L (ref 135–145)

## 2015-09-03 LAB — GLUCOSE, CAPILLARY
Glucose-Capillary: 161 mg/dL — ABNORMAL HIGH (ref 65–99)
Glucose-Capillary: 115 mg/dL — ABNORMAL HIGH (ref 65–99)

## 2015-09-03 LAB — HIV ANTIBODY (ROUTINE TESTING W REFLEX): HIV Screen 4th Generation wRfx: NONREACTIVE

## 2015-09-03 LAB — TROPONIN I
Troponin I: 0.17 ng/mL — ABNORMAL HIGH (ref <0.031)
Troponin I: 0.16 ng/mL — ABNORMAL HIGH (ref <0.031)
Troponin I: 0.16 ng/mL — ABNORMAL HIGH (ref <0.031)
Troponin I: 0.16 ng/mL — ABNORMAL HIGH (ref <0.031)

## 2015-09-03 LAB — ECHOCARDIOGRAM COMPLETE
Height: 71 in
Weight: 3435.65 oz

## 2015-09-03 LAB — PROCALCITONIN: Procalcitonin: <0.1 ng/mL

## 2015-09-03 LAB — MRSA PCR SCREENING: MRSA by PCR: NEGATIVE

## 2015-09-03 MED ORDER — LEVALBUTEROL HCL 0.63 MG/3ML IN NEBU
0.6300 mg | INHALATION_SOLUTION | Freq: Three times a day (TID) | RESPIRATORY_TRACT | Status: DC
  Administered 2015-09-03 (×2): 0.63 mg via RESPIRATORY_TRACT
  Filled 2015-09-03 (×2): qty 3

## 2015-09-03 MED ORDER — ALBUTEROL SULFATE (2.5 MG/3ML) 0.083% IN NEBU: 2.5000 mg | INHALATION_SOLUTION | Freq: Four times a day (QID) | RESPIRATORY_TRACT | Status: DC | PRN

## 2015-09-03 MED ORDER — POTASSIUM CHLORIDE CRYS ER 20 MEQ PO TBCR
20.0000 meq | EXTENDED_RELEASE_TABLET | Freq: Every day | ORAL | Status: DC
  Administered 2015-09-03 – 2015-09-09 (×7): 20 meq via ORAL
  Filled 2015-09-03 (×7): qty 1

## 2015-09-03 MED ORDER — FUROSEMIDE 10 MG/ML IJ SOLN
40.0000 mg | Freq: Two times a day (BID) | INTRAMUSCULAR | Status: DC
  Filled 2015-09-03: qty 4

## 2015-09-03 MED ORDER — INSULIN ASPART 100 UNIT/ML ~~LOC~~ SOLN: 0.0000 [IU] | Freq: Every day | SUBCUTANEOUS | Status: DC

## 2015-09-03 MED ORDER — METOPROLOL TARTRATE 25 MG PO TABS
25.0000 mg | ORAL_TABLET | Freq: Two times a day (BID) | ORAL | Status: DC
  Administered 2015-09-03 – 2015-09-09 (×12): 25 mg via ORAL
  Filled 2015-09-03 (×13): qty 1

## 2015-09-03 MED ORDER — DIPHENHYDRAMINE HCL 50 MG/ML IJ SOLN
25.0000 mg | Freq: Once | INTRAMUSCULAR | Status: AC
  Administered 2015-09-03: 25 mg via INTRAVENOUS
  Filled 2015-09-03: qty 1

## 2015-09-03 MED ORDER — LEVALBUTEROL HCL 0.63 MG/3ML IN NEBU
0.6300 mg | INHALATION_SOLUTION | Freq: Four times a day (QID) | RESPIRATORY_TRACT | Status: DC | PRN
  Administered 2015-09-03: 0.63 mg via RESPIRATORY_TRACT
  Filled 2015-09-03: qty 3

## 2015-09-03 MED ORDER — AMIODARONE HCL 200 MG PO TABS
200.0000 mg | ORAL_TABLET | Freq: Every day | ORAL | Status: DC
  Administered 2015-09-04: 200 mg via ORAL
  Filled 2015-09-03: qty 1

## 2015-09-03 MED ORDER — ATORVASTATIN CALCIUM 40 MG PO TABS
40.0000 mg | ORAL_TABLET | Freq: Every day | ORAL | Status: DC
  Administered 2015-09-03 – 2015-09-08 (×6): 40 mg via ORAL
  Filled 2015-09-03 (×6): qty 1

## 2015-09-03 MED ORDER — DIPHENHYDRAMINE HCL 50 MG/ML IJ SOLN
25.0000 mg | Freq: Two times a day (BID) | INTRAMUSCULAR | Status: DC | PRN
  Administered 2015-09-03: 25 mg via INTRAVENOUS
  Filled 2015-09-03 (×2): qty 1

## 2015-09-03 MED ORDER — INSULIN ASPART 100 UNIT/ML ~~LOC~~ SOLN
0.0000 [IU] | Freq: Three times a day (TID) | SUBCUTANEOUS | Status: DC
  Administered 2015-09-03: 2 [IU] via SUBCUTANEOUS
  Administered 2015-09-05: 1 [IU] via SUBCUTANEOUS

## 2015-09-03 MED ORDER — MELATONIN 3 MG PO TABS
3.0000 mg | ORAL_TABLET | Freq: Every evening | ORAL | Status: DC | PRN
  Administered 2015-09-03 – 2015-09-07 (×4): 3 mg via ORAL
  Filled 2015-09-03 (×7): qty 1

## 2015-09-03 MED ORDER — TRAMADOL HCL 50 MG PO TABS
50.0000 mg | ORAL_TABLET | Freq: Four times a day (QID) | ORAL | Status: DC | PRN
  Administered 2015-09-04 – 2015-09-07 (×5): 50 mg via ORAL
  Filled 2015-09-03 (×6): qty 1

## 2015-09-03 MED ORDER — FUROSEMIDE 10 MG/ML IJ SOLN
20.0000 mg | Freq: Two times a day (BID) | INTRAMUSCULAR | Status: DC
  Administered 2015-09-03: 20 mg via INTRAVENOUS
  Filled 2015-09-03: qty 2

## 2015-09-03 MED ORDER — BUDESONIDE 0.5 MG/2ML IN SUSP
0.5000 mg | Freq: Two times a day (BID) | RESPIRATORY_TRACT | Status: DC
  Administered 2015-09-03 – 2015-09-08 (×8): 0.5 mg via RESPIRATORY_TRACT
  Filled 2015-09-03 (×9): qty 2

## 2015-09-03 NOTE — Progress Notes (Signed)
  Echocardiogram 2D Echocardiogram has been performed.  Donata Clay 09/03/2015, 1:33 PM

## 2015-09-03 NOTE — Progress Notes (Addendum)
PROGRESS NOTE  [name]  [id number] DOB: [birth year] DOA: 09/02/2015 PCP: Velna Hatchet, MD Outpatient Specialists:  Dr. Prescott Gum cardiothoracic Brief Narrative:   [name] is a 65 y.o. male with medical history significant of HTN, HLD, OSA, ventricular pacemaker, aortic stenosis s/p bioprosthetic valve replacement on 4/25, just discharged from hospital on 5/1.  Patient presented to the ED with worsening BLE edema, orthopnea, SOB, DOE. Home pulse ox at rest reading was 86%. No home O2 at baseline. Patients wife convinced him to come in for the oxygen level.  No fever, no chills.  Cough productive of yellow sputum.    Assessment & Plan:   Principal Problem:   Hypoxia Active Problems:   Hypertension   S/P aortic valve replacement with bioprosthetic valve   Acute CHF (congestive heart failure) (HCC)   Pleural effusion   Acute respiratory failure with hypoxia secondary to probable acute on chronic diastolic heart failure versus health care associated pneumonia vs. Amiodarone toxicity.  Acute congestive heart failure with pulmonary edema and right pleural effusion is most likely.  He may have had a post-operative MI, however, his preoperative heart catheterization on 08/09/2015 demonstrated nonobstructive CAD with only a 10% stenosis of the mid RCA and a 26% stenosis of the ostial circumflex and proximal circumflex. At that time his left ventricular systolic function was normal.  Alternatively, this may be due to valvular dysfunction after recent bioprosthetic AVR.  He was also started on amiodarone.  After reviewing the records, I suspect that this patient has chronic diastolic heart failure. His Lasix was stopped during his previous hospitalization secondary to her rising creatinine. Although his spironolactone was resumed post discharge, his Lasix was not.  What is unusual is that his recorded weight appears to be less than when he was recently hospitalized.   -  Check  procalcitonin.  < 0.1 -  Will discontinue antibiotics once cultures are negative for 24 hours -  US guided thoracentesis ordered by cardiothoracic surgery -  CT chest without contrast ordered by CT surgery -  Daily weights -  Strict ins and outs -  Increase lasix to 40mg  IV BID -  Continue BB and ACEI and monitor creatinine carefully -  ECHO:  EF preserved.  Probable diastolic dysfunction with blunted IVC.  Aortic valve with mild to moderate regurgitation.    Bioprosthetic aortic valve replacement by Dr. Prescott Gum on 08/22/2015 -  Holding pradaxa, but would otherwise continue through 09/18/2015 -  Continue amiodarone, but I suspect there is a post-surgery tapering schedule.  CT surgery to please clarify  Chronic atrial fibrillation s/p MAZE procedure and atrial appendage clipping on 08/22/2015.  S/p PPM placement.  CHADs2vasc = 3 (hypertension, diabetes, and age).  Continue pradaxa as above.  Hallucinations, may be due to stroke, either hemorrhagic or ischemic given a-fib and anticoagulation.  Patient cannot get MRI due to pacemaker  -  CT head -  Monitor for infections:  Pneumonia unlikely given low procalcitonin -  Check UA for UTI  Elevated troponins are only mildly elevated and are trending down.  Likely due to MAZE procedure, recent valve replacement, heart failure in setting of CKD. -  ECG:  Ventricularly paced rhythm. Mild flattening of the T waves in V3 through V5. T-wave inversion in V6 and deeper T waves in the lateral leads compared to prior.  Essential hypertension, blood pressures low normal   Borderline diabetes mellitus with hyperglycemia -  Start SSI  CKD stage 3, creatinine  near baseline of 1.5 -  Minimize nephrotoxins and renally dose medications  Hypothyroidism -  Check TSH -  Continue synthroid  Leukocytosis, no obvious source of infection -  Possible pneumonia, although procalcitonin in low -  UA/UCx  Normocytic anemia -  TSH -  Occult stool -  Repeat hgb  in AM    DVT prophylaxis:  SCDs Code Status:  full Family Communication:  Patient alone Disposition Plan:  Pending further diuresis and work up for heart failure/valvular dysfunction   Consultants:   Cardiothoracic surgery, Dr. Prescott Gum  Procedures:  CT chest  US guided thoracentesis CT head  ECHO  Antimicrobials:   Vancomycin 5/6 >   Zosyn 5/6  Cefepime 5/6 >   Subjective:  States he feels better.  He was able to sleep in his bed last night (less orthopnea) and his legs are less swollen this morning.  He has had some hallucinations of water dripping from the ceiling.    Objective: Filed Vitals:   09/03/15 0500 09/03/15 0729 09/03/15 0759 09/03/15 1128  BP: 137/60 141/65  128/65  Pulse: 70     Temp: 97.6 F (36.4 C) 97.7 F (36.5 C)  97.9 F (36.6 C)  TempSrc: Oral Oral  Oral  Resp: 25 20  18   Height:      Weight:      SpO2: 95%  96%     Intake/Output Summary (Last 24 hours) at 09/03/15 1438 Last data filed at 09/03/15 1000  Gross per 24 hour  Intake    460 ml  Output    900 ml  Net   -440 ml   Filed Weights   09/02/15 2000 09/03/15 0039 09/03/15 0303  Weight: 97.977 kg (216 lb) 105.3 kg (232 lb 2.3 oz) 97.4 kg (214 lb 11.7 oz)    Examination:  General exam:  Adult male.  No acute distress.  Appears calm and comfortable  HEENT:  NCAT, MMM Respiratory system:  Diminished at the right base, rales at the right mid back and left base.  No wheezes, no rhonchi .  Respiratory effort normal. Cardiovascular system: RRR. 2/6 diastolic murmur at the right upper sternal border.  Warm extremities Gastrointestinal system: Abdomen is distended, soft and nontender. Normal bowel sounds. Skin: No rashes, lesions or ulcers MSK:  Normal tone and bulk, 2+ pitting bilateral lower extremity edema Neuro:  CN II-XII grossly intact, strength 5/5, SITLT Psychiatry: Mood & affect appropriate.  Oriented x 3.  Having hallucinations    Data Reviewed: I have personally  reviewed following labs and imaging studies  CBC:  Recent Labs Lab 09/02/15 2005  WBC 18.0*  NEUTROABS 15.4*  HGB 9.1*  HCT 29.0*  MCV 85.3  PLT [id number]   Basic Metabolic Panel:  Recent Labs Lab 08/28/15 0301 09/02/15 2005 09/03/15 0550  NA 137 135 135  K 3.1* 3.9 3.7  CL 97* 97* 96*  CO2 28 25 25   GLUCOSE 90 129* 171*  BUN 31* 23* 25*  CREATININE 1.47* 1.50* 1.58*  CALCIUM 8.6* 8.7* 8.6*   GFR: Estimated Creatinine Clearance: 54.7 mL/min (by C-G formula based on Cr of 1.58). Liver Function Tests:  Recent Labs Lab 09/02/15 2005  AST 30  ALT 28  ALKPHOS 108  BILITOT 1.3*  PROT 6.5  ALBUMIN 2.8*   No results for input(s): LIPASE, AMYLASE in the last 168 hours. No results for input(s): AMMONIA in the last 168 hours. Coagulation Profile:  Recent Labs Lab 09/02/15 2011  INR 2.08*  Cardiac Enzymes:  Recent Labs Lab 09/02/15 2012 09/03/15 0550 09/03/15 1051  TROPONINI 0.21* 0.17* 0.16*   BNP (last 3 results) No results for input(s): PROBNP in the last 8760 hours. HbA1C: No results for input(s): HGBA1C in the last 72 hours. CBG:  Recent Labs Lab 08/27/15 1652 08/27/15 2206 08/28/15 0641 08/28/15 1122  GLUCAP 155* 109* 104* 177*   Lipid Profile: No results for input(s): CHOL, HDL, LDLCALC, TRIG, CHOLHDL, LDLDIRECT in the last 72 hours. Thyroid Function Tests: No results for input(s): TSH, T4TOTAL, FREET4, T3FREE, THYROIDAB in the last 72 hours. Anemia Panel: No results for input(s): VITAMINB12, FOLATE, FERRITIN, TIBC, IRON, RETICCTPCT in the last 72 hours. Urine analysis:    Component Value Date/Time   COLORURINE YELLOW 08/18/2015 0835   APPEARANCEUR CLEAR 08/18/2015 0835   LABSPEC 1.018 08/18/2015 0835   PHURINE 5.5 08/18/2015 0835   GLUCOSEU NEGATIVE 08/18/2015 0835   HGBUR NEGATIVE 08/18/2015 0835   BILIRUBINUR NEGATIVE 08/18/2015 0835   KETONESUR NEGATIVE 08/18/2015 0835   PROTEINUR NEGATIVE 08/18/2015 0835   NITRITE NEGATIVE  08/18/2015 0835   LEUKOCYTESUR NEGATIVE 08/18/2015 0835   Sepsis Labs: @LABRCNTIP (procalcitonin:4,lacticidven:4)  ) Recent Results (from the past 240 hour(s))  MRSA PCR Screening     Status: None   Collection Time: 09/03/15 12:16 AM  Result Value Ref Range Status   MRSA by PCR NEGATIVE NEGATIVE Final    Comment:        The GeneXpert MRSA Assay (FDA approved for NASAL specimens only), is one component of a comprehensive MRSA colonization surveillance program. It is not intended to diagnose MRSA infection nor to guide or monitor treatment for MRSA infections.       Radiology Studies: Dg Chest 2 View  09/02/2015  CLINICAL DATA:  65 year old male with a history of shortness of breath. Surgery 08/22/2015 with aortic valve replacement and 25 mm pericardial valve, left atrial clipping, left atrial Maze procedure EXAM: CHEST - 2 VIEW COMPARISON:  08/27/2015 FINDINGS: Cardiomediastinal silhouette likely unchanged though partially obscured by overlying lung and pleural disease on the right. Surgical changes of prior median sternotomy, aortic valve replacement, and atrial amputation. Cardiac pacing device on left chest wall unchanged. Previous pneumothorax is not visualized. Interstitial opacities with interlobular septal thickening and increasing right-sided pleural effusion. Opacity at the right base obscures the right hemidiaphragm and right heart border. No displaced fracture. Unremarkable appearance of the upper abdomen. IMPRESSION: Bilateral interstitial opacities and interlobular septal thickening with right greater than left basilar opacity, potentially a combination of edema, atelectasis, and/or consolidation. Prior right-sided pneumothorax not visualized on the current study. Surgical changes of median sternotomy, aortic valve replacement, and left atrial amputation. Signed, Dulcy Fanny. Earleen Newport, DO [facility] Radiology Electronically Signed    By: Corrie Mckusick D.O.   On: 09/02/2015 21:07   Ct Head Wo Contrast  09/03/2015  CLINICAL DATA:  Hallucinations for several days EXAM: CT HEAD WITHOUT CONTRAST TECHNIQUE: Contiguous axial images were obtained from the base of the skull through the vertex without intravenous contrast. COMPARISON:  None. FINDINGS: There is age related volume loss. There is no intracranial mass, hemorrhage, extra-axial fluid collection, or midline shift. There is mild small vessel disease in the centra semiovale bilaterally. Elsewhere gray-white compartments appear normal. No acute infarct evident. The bony calvarium appears intact. The mastoid air cells are clear. No intraorbital lesions are present. IMPRESSION: Age related volume loss with mild periventricular small vessel disease. No intracranial mass, hemorrhage, or acute appearing infarct. Electronically  Signed   By: Lowella Grip III M.D.   On: 09/03/2015 09:20   Ct Chest Wo Contrast  09/03/2015  CLINICAL DATA:  Patient with shortness of breath. Bilateral pleural effusions. EXAM: CT CHEST WITHOUT CONTRAST TECHNIQUE: Multidetector CT imaging of the chest was performed following the standard protocol without IV contrast. COMPARISON:  Chest radiograph 09/02/2015 FINDINGS: Mediastinum/Nodes: Multi lead pacer apparatus overlies the left hemi thorax. Multiple prominent mediastinal lymph nodes are present measuring up to 11 mm within the subcarinal location (image 66; series 2) and 11 mm within the pretracheal location (image 40; series 2). Ascending thoracic aorta measures 4.3 cm. Heart is enlarged. No pericardial effusion. There a few small foci of gas anterior to the pericardial fat (image 101; series 2). Lungs/Pleura: Central airways are patent. Patchy ground-glass opacities throughout the lungs bilaterally. 2 mm left upper lobe pulmonary nodule (Image 21; series 9). Subpleural consolidation within the right middle and right lower lobes. Moderate right and small left pleural  effusions. Upper abdomen: Masslike high attenuation within the stomach measuring up to 6 cm. Musculoskeletal: Patient status post median sternotomy. Mild stranding within the retrosternal fat likely postoperative in etiology. IMPRESSION: Moderate right and small left pleural effusions. Small amount of gas within the anterior right hemi thorax along the pericardial fat, likely postoperative in etiology. Patchy ground-glass opacities throughout the lungs bilaterally nonspecific however favored to represent pulmonary edema. Consolidation within the right middle and right lower lobe may represent atelectasis. Infection not excluded. Nonspecific high attenuation within the stomach may potentially represent ingested contents. Consider correlation with nonemergent upper GI to exclude the possibility of gastric mass. Interval increase in size of multiple mildly enlarged mediastinal lymph nodes which are nonspecific however likely reactive in etiology given recent surgical history. 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 Recommend annual imaging followup by CTA or MRA. This recommendation follows 2010 ACCF/AHA/AATS/ACR/ASA/SCA/SCAI/SIR/STS/SVM Guidelines for the Diagnosis and Management of Patients with Thoracic Aortic Disease. Circulation. 2010; [id number] Electronically Signed   By: Lovey Newcomer M.D.   On: 09/03/2015 10:18     Scheduled Meds: . amiodarone  200 mg Oral BID  . amLODipine  10 mg Oral Daily  . aspirin EC  81 mg Oral Daily  . atorvastatin  40 mg Oral q1800  . ceFEPime (MAXIPIME) IV  1 g Intravenous Q8H  . furosemide  40 mg Intravenous BID  . gabapentin  600 mg Oral QHS  . insulin aspart  0-5 Units Subcutaneous QHS  . insulin aspart  0-9 Units Subcutaneous TID WC  . irbesartan  150 mg Oral Daily  . levalbuterol  0.63 mg Nebulization TID  . levothyroxine  175 mcg Oral QAC breakfast  . loratadine  10 mg Oral Daily  . metoprolol tartrate  25 mg Oral BID  . pantoprazole  40 mg Oral  Daily  . rOPINIRole  1 mg Oral Daily  . spironolactone  12.5 mg Oral Daily  . traMADol  50 mg Oral Daily  . vancomycin  750 mg Intravenous Q12H   Continuous Infusions:    LOS: 1 day    Time spent: 30 min    Janece Canterbury, MD [facility] Pager [phone]  If 7PM-7AM, please contact night-coverage [email or web address] Password [facility] 09/03/2015, 2:38 PM

## 2015-09-03 NOTE — Progress Notes (Signed)
  Subjective: Patient examined, echocardiogram and chest CT personally reviewed   AVR 3 weeks ago for AS - tissue valve- with Maze procedure SOB, productive cough brown sputum, wbc 18k EXam- scattered wheezes, sternotomy well healed, decreased BS at R base Echo- good LV fx, AVR normal and no sig pericardial effusion CT - mod R pleural effusion, mild edema - air space dz, no retrosternal infection  He should improve with R thoracentesis, careful diuresis [creat 1.55] and antibiotics  Objective: Vital signs in last 24 hours: Temp:  [97.6 F (36.4 C)-98.2 F (36.8 C)] 97.9 F (36.6 C) (05/07 1128) Pulse Rate:  [64-70] 70 (05/07 0500) Cardiac Rhythm:  [-] Ventricular paced (05/07 0759) Resp:  [17-30] 18 (05/07 1128) BP: (90-152)/(60-102) 128/65 mmHg (05/07 1128) SpO2:  [90 %-99 %] 96 % (05/07 0759) Weight:  [214 lb 11.7 oz (97.4 kg)-232 lb 2.3 oz (105.3 kg)] 214 lb 11.7 oz (97.4 kg) (05/07 0303)  Hemodynamic parameters for last 24 hours:  paced rhythm  Intake/Output from previous day: 05/06 0701 - 05/07 0700 In: 100 [IV Piggyback:100] Out: 900 [Urine:900] Intake/Output this shift: Total I/O In: 360 [P.O.:360] Out: -   Soft flow M thru AVR- no AI No pedal edema  Lab Results:  Recent Labs  09/02/15 2005  WBC 18.0*  HGB 9.1*  HCT 29.0*  PLT 361   BMET:  Recent Labs  09/02/15 2005 09/03/15 0550  NA 135 135  K 3.9 3.7  CL 97* 96*  CO2 25 25  GLUCOSE 129* 171*  BUN 23* 25*  CREATININE 1.50* 1.58*  CALCIUM 8.7* 8.6*    PT/INR:  Recent Labs  09/02/15 2011  LABPROT 23.2*  INR 2.08*   ABG    Component Value Date/Time   PHART 7.315* 08/23/2015 0023   HCO3 21.0 08/23/2015 0023   TCO2 26 08/23/2015 1659   ACIDBASEDEF 5.0* 08/23/2015 0023   O2SAT 94.0 08/23/2015 0023   CBG (last 3)  No results for input(s): GLUCAP in the last 72 hours.  Assessment/Plan: S/P   Cont amiodarone Ok to resume pradaxa 12hrs after thoracentesis by IR   LOS: 1 day     [name] 09/03/2015

## 2015-09-04 ENCOUNTER — Inpatient Hospital Stay (HOSPITAL_COMMUNITY): Payer: Medicare Other

## 2015-09-04 DIAGNOSIS — I712 Thoracic aortic aneurysm, without rupture: Secondary | ICD-10-CM

## 2015-09-04 DIAGNOSIS — I7121 Aneurysm of the ascending aorta, without rupture: Secondary | ICD-10-CM

## 2015-09-04 DIAGNOSIS — R591 Generalized enlarged lymph nodes: Secondary | ICD-10-CM

## 2015-09-04 DIAGNOSIS — R443 Hallucinations, unspecified: Secondary | ICD-10-CM

## 2015-09-04 LAB — BASIC METABOLIC PANEL
Anion gap: 10 (ref 5–15)
BUN: 33 mg/dL — ABNORMAL HIGH (ref 6–20)
CO2: 28 mmol/L (ref 22–32)
Calcium: 8.5 mg/dL — ABNORMAL LOW (ref 8.9–10.3)
Chloride: 100 mmol/L — ABNORMAL LOW (ref 101–111)
Creatinine, Ser: 1.64 mg/dL — ABNORMAL HIGH (ref 0.61–1.24)
GFR calc Af Amer: 49 mL/min — ABNORMAL LOW (ref >60)
GFR calc non Af Amer: 42 mL/min — ABNORMAL LOW (ref >60)
Glucose, Bld: 118 mg/dL — ABNORMAL HIGH (ref 65–99)
Potassium: 4.1 mmol/L (ref 3.5–5.1)
Sodium: 138 mmol/L (ref 135–145)

## 2015-09-04 LAB — CBC
HCT: 27.6 % — ABNORMAL LOW (ref 39.0–52.0)
Hemoglobin: 8.7 g/dL — ABNORMAL LOW (ref 13.0–17.0)
MCH: 26.6 pg (ref 26.0–34.0)
MCHC: 31.5 g/dL (ref 30.0–36.0)
MCV: 84.4 fL (ref 78.0–100.0)
Platelets: 430 10*3/uL — ABNORMAL HIGH (ref 150–400)
RBC: 3.27 MIL/uL — ABNORMAL LOW (ref 4.22–5.81)
RDW: 15.8 % — ABNORMAL HIGH (ref 11.5–15.5)
WBC: 25.7 10*3/uL — ABNORMAL HIGH (ref 4.0–10.5)

## 2015-09-04 LAB — GRAM STAIN

## 2015-09-04 LAB — GLUCOSE, CAPILLARY
Glucose-Capillary: 114 mg/dL — ABNORMAL HIGH (ref 65–99)
Glucose-Capillary: 98 mg/dL (ref 65–99)
Glucose-Capillary: 123 mg/dL — ABNORMAL HIGH (ref 65–99)
Glucose-Capillary: 100 mg/dL — ABNORMAL HIGH (ref 65–99)

## 2015-09-04 LAB — VITAMIN B12: Vitamin B-12: 384 pg/mL (ref 180–914)

## 2015-09-04 LAB — FERRITIN: Ferritin: 738 ng/mL — ABNORMAL HIGH (ref 24–336)

## 2015-09-04 LAB — SEDIMENTATION RATE: Sed Rate: 60 mm/hr — ABNORMAL HIGH (ref 0–16)

## 2015-09-04 LAB — IRON AND TIBC
Iron: 30 ug/dL — ABNORMAL LOW (ref 45–182)
Saturation Ratios: 8 % — ABNORMAL LOW (ref 17.9–39.5)
TIBC: 392 ug/dL (ref 250–450)
UIBC: 362 ug/dL

## 2015-09-04 LAB — TROPONIN I
Troponin I: 0.15 ng/mL — ABNORMAL HIGH (ref <0.031)
Troponin I: 0.14 ng/mL — ABNORMAL HIGH (ref <0.031)
Troponin I: 0.15 ng/mL — ABNORMAL HIGH (ref <0.031)

## 2015-09-04 LAB — FOLATE: Folate: 6.5 ng/mL (ref >5.9)

## 2015-09-04 LAB — STREP PNEUMONIAE URINARY ANTIGEN: Strep Pneumo Urinary Antigen: NEGATIVE

## 2015-09-04 LAB — TSH: TSH: 0.441 u[IU]/mL (ref 0.350–4.500)

## 2015-09-04 MED ORDER — LIDOCAINE HCL (PF) 1 % IJ SOLN
INTRAMUSCULAR | Status: AC
  Filled 2015-09-04: qty 10

## 2015-09-04 MED ORDER — DABIGATRAN ETEXILATE MESYLATE 150 MG PO CAPS
150.0000 mg | ORAL_CAPSULE | Freq: Two times a day (BID) | ORAL | Status: DC
  Administered 2015-09-04 – 2015-09-09 (×10): 150 mg via ORAL
  Filled 2015-09-04 (×13): qty 1

## 2015-09-04 MED ORDER — ZOLPIDEM TARTRATE 5 MG PO TABS
5.0000 mg | ORAL_TABLET | Freq: Every evening | ORAL | Status: DC | PRN
  Administered 2015-09-06 – 2015-09-08 (×2): 5 mg via ORAL
  Filled 2015-09-04 (×2): qty 1

## 2015-09-04 MED ORDER — GABAPENTIN 300 MG PO CAPS
600.0000 mg | ORAL_CAPSULE | Freq: Every day | ORAL | Status: DC
  Administered 2015-09-04 – 2015-09-08 (×5): 600 mg via ORAL
  Filled 2015-09-04 (×5): qty 2

## 2015-09-04 MED ORDER — FUROSEMIDE 10 MG/ML IJ SOLN
40.0000 mg | Freq: Two times a day (BID) | INTRAMUSCULAR | Status: DC
  Administered 2015-09-04 – 2015-09-05 (×3): 40 mg via INTRAVENOUS
  Filled 2015-09-04 (×3): qty 4

## 2015-09-04 NOTE — Progress Notes (Addendum)
PROGRESS NOTE  [name]  [id number] DOB: 1950-10-24 DOA: 09/02/2015 PCP: Velna Hatchet, MD Outpatient Specialists:  Dr. Prescott Gum cardiothoracic Brief Narrative:   [name] is a 65 y.o. male with medical history significant of HTN, HLD, OSA, ventricular pacemaker, aortic stenosis s/p bioprosthetic valve replacement on 4/25, just discharged from hospital on 5/1.  Patient presented to the ED with worsening BLE edema, orthopnea, SOB, DOE. Home pulse ox at rest reading was 86%. No home O2 at baseline. Patients wife convinced him to come in for the oxygen level.  No fever, no chills.  Cough productive of yellow sputum.    Assessment & Plan:   Principal Problem:   Hypoxia Active Problems:   Hypertension   S/P aortic valve replacement with bioprosthetic valve   Acute CHF (congestive heart failure) (HCC)   Pleural effusion   Acute respiratory failure with hypoxia secondary to probable acute on chronic diastolic heart failure versus health care associated pneumonia vs. Amiodarone toxicity.  Acute congestive heart failure with pulmonary edema and right pleural effusion is most likely.  He may have had a post-operative MI, however, his preoperative heart catheterization on 08/09/2015 demonstrated nonobstructive CAD with only a 10% stenosis of the mid RCA and a 26% stenosis of the ostial circumflex and proximal circumflex. At that time his left ventricular systolic function was normal.  Alternatively, this may be due to valvular dysfunction after recent bioprosthetic AVR.  He was also started on amiodarone.  After reviewing the records, I suspect that this patient's symptoms are due to chronic diastolic heart failure. His Lasix was stopped during his previous hospitalization secondary to her rising creatinine. Although his spironolactone was resumed post discharge, his Lasix was not.  What is unusual is that his recorded weight appears to be less than when he was recently hospitalized.    -  Check procalcitonin.  < 0.1 -  D/c antibiotics  -  US guided thoracentesis -  CT chest without contrast:  Pulmonary edema with possible consolidation vs. Atelectasis > start incentive spirometry -  Wt approximately stable -  +336mL  -  Increase lasix to 40mg  IV BID -  Continue BB and ACEI and monitor creatinine carefully -  ECHO:  EF preserved.  Probable diastolic dysfunction with blunted IVC.  Aortic valve with mild to moderate regurgitation.    Bioprosthetic aortic valve replacement by Dr. Prescott Gum on 08/22/2015 -  Holding pradaxa, but would otherwise continue through 09/18/2015 -  Continue amiodarone, but I suspect there is a post-surgery tapering schedule.  CT surgery to please clarify  Chronic atrial fibrillation s/p MAZE procedure and atrial appendage clipping on 08/22/2015.  S/p PPM placement.  CHADs2vasc = 3 (hypertension, diabetes, and age).   -  Resume pradaxa once clear he will not need further procedures, continue through 09/18/2015  Ascending aortic aneurysm 4.3cm -  Repeat CT chest 08/2016  Possible gastric mass and lymphadenopathy. Most likely, this reflects food in stomach and edema -  Upper GI to exclude mass -  Repeat CT chest in 3 months -  Check LDH  Hallucinations, may be due to stroke, either hemorrhagic or ischemic given a-fib and anticoagulation.  Patient cannot get MRI due to pacemaker.  May also have some delirium -  CT head:  No acute disease -  Monitor for infections:  Pneumonia unlikely given low procalcitonin -  UA negative for UTI  Elevated troponins are only mildly elevated and are trending down.  Likely due to MAZE  procedure, recent valve replacement, heart failure in setting of CKD.  -  ECG:  Ventricularly paced rhythm. Mild flattening of the T waves in V3 through V5. T-wave inversion in V6 and deeper T waves in the lateral leads compared to prior.  Essential hypertension, blood pressures low normal   Borderline diabetes mellitus, CBG improved -   Continue SSI  CKD stage 3, creatinine near baseline of 1.5 -  Minimize nephrotoxins and renally dose medications  Hypothyroidism -  TSH pending -  Continue synthroid  Leukocytosis, no obvious source of infection -  Possible pneumonia, although procalcitonin in low -  UA neg -  F/u urine culture -  F/u AM CBC  Normocytic anemia -  TSH pending -  Occult stool -  Repeat hgb in AM   DVT prophylaxis:  SCDs Code Status:  full Family Communication:  Patient alone Disposition Plan:  Pending further diuresis and work up for heart failure/valvular dysfunction   Consultants:   Cardiothoracic surgery, Dr. Prescott Gum  Procedures:  CT chest  US guided thoracentesis CT head  ECHO  Antimicrobials:   Vancomycin 5/6 > 5/8   Zosyn 5/6  Cefepime 5/6 > 5/8   Subjective:  States he feels better.  Legs are about the same amount of swelling this morning.  Denies confusion and states hallucinations have resolved, however, nurse states he did not sleep at all and has had some mild confusion.  Denies chest pain.  SOB improved since admission.   Objective: Filed Vitals:   09/03/15 1930 09/03/15 2232 09/04/15 0000 09/04/15 0800  BP: 110/60 120/60    Pulse:  70    Temp: 98.6 F (37 C)   97.8 F (36.6 C)  TempSrc: Oral   Oral  Resp:      Height:      Weight:      SpO2: 97%  93% 97%    Intake/Output Summary (Last 24 hours) at 09/04/15 0835 Last data filed at 09/03/15 2329  Gross per 24 hour  Intake   1160 ml  Output    800 ml  Net    360 ml   Filed Weights   09/02/15 2000 09/03/15 0039 09/03/15 0303  Weight: 97.977 kg (216 lb) 105.3 kg (232 lb 2.3 oz) 97.4 kg (214 lb 11.7 oz)    Examination:  General exam:  Adult male.  No acute distress.  Appears calm and comfortable, sitting in chair eating breakfast.   HEENT:  NCAT, MMM Respiratory system:  Diminished at the right base, no rales, faint wheezes, no rhonchi .  Respiratory effort normal. Cardiovascular system: RRR.  2/6 systolic murmur at the right upper sternal border.  Warm extremities Gastrointestinal system: Abdomen is distended, soft and nontender. Normal bowel sounds. Skin: No rashes, lesions or ulcers MSK:  Normal tone and bulk, 2+ pitting bilateral lower extremity edema Neuro:  CN II-XII grossly intact, strength 5/5, SITLT Psychiatry: Mood & affect appropriate.  Oriented x 3.     Data Reviewed: I have personally reviewed following labs and imaging studies  CBC:  Recent Labs Lab 09/02/15 2005  WBC 18.0*  NEUTROABS 15.4*  HGB 9.1*  HCT 29.0*  MCV 85.3  PLT [id number]   Basic Metabolic Panel:  Recent Labs Lab 09/02/15 2005 09/03/15 0550  NA 135 135  K 3.9 3.7  CL 97* 96*  CO2 25 25  GLUCOSE 129* 171*  BUN 23* 25*  CREATININE 1.50* 1.58*  CALCIUM 8.7* 8.6*   GFR: Estimated Creatinine Clearance: 54.7  mL/min (by C-G formula based on Cr of 1.58). Liver Function Tests:  Recent Labs Lab 09/02/15 2005  AST 30  ALT 28  ALKPHOS 108  BILITOT 1.3*  PROT 6.5  ALBUMIN 2.8*   No results for input(s): LIPASE, AMYLASE in the last 168 hours. No results for input(s): AMMONIA in the last 168 hours. Coagulation Profile:  Recent Labs Lab 09/02/15 2011  INR 2.08*   Cardiac Enzymes:  Recent Labs Lab 09/02/15 2012 09/03/15 0550 09/03/15 1051 09/03/15 1356 09/03/15 1835  TROPONINI 0.21* 0.17* 0.16* 0.16* 0.16*   BNP (last 3 results) No results for input(s): PROBNP in the last 8760 hours. HbA1C: No results for input(s): HGBA1C in the last 72 hours. CBG:  Recent Labs Lab 08/28/15 1122 09/03/15 1622 09/03/15 2133 09/04/15 0826  GLUCAP 177* 161* 115* 114*   Lipid Profile: No results for input(s): CHOL, HDL, LDLCALC, TRIG, CHOLHDL, LDLDIRECT in the last 72 hours. Thyroid Function Tests: No results for input(s): TSH, T4TOTAL, FREET4, T3FREE, THYROIDAB in the last 72 hours. Anemia Panel: No results for input(s): VITAMINB12, FOLATE, FERRITIN, TIBC, IRON, RETICCTPCT in the  last 72 hours. Urine analysis:    Component Value Date/Time   COLORURINE YELLOW 09/03/2015 2333   APPEARANCEUR CLEAR 09/03/2015 2333   LABSPEC 1.015 09/03/2015 2333   PHURINE 5.5 09/03/2015 2333   GLUCOSEU NEGATIVE 09/03/2015 2333   HGBUR NEGATIVE 09/03/2015 2333   BILIRUBINUR NEGATIVE 09/03/2015 2333   KETONESUR NEGATIVE 09/03/2015 2333   PROTEINUR NEGATIVE 09/03/2015 2333   NITRITE NEGATIVE 09/03/2015 2333   LEUKOCYTESUR NEGATIVE 09/03/2015 2333   Sepsis Labs: @LABRCNTIP (procalcitonin:4,lacticidven:4)  ) Recent Results (from the past 240 hour(s))  Blood culture (routine x 2)     Status: None (Preliminary result)   Collection Time: 09/02/15  9:16 PM  Result Value Ref Range Status   Specimen Description BLOOD RIGHT ANTECUBITAL  Final   Special Requests IN PEDIATRIC BOTTLE 3CC  Final   Culture NO GROWTH < 24 HOURS  Final   Report Status PENDING  Incomplete  Blood culture (routine x 2)     Status: None (Preliminary result)   Collection Time: 09/02/15  9:20 PM  Result Value Ref Range Status   Specimen Description BLOOD LEFT ANTECUBITAL  Final   Special Requests BOTTLES DRAWN AEROBIC ONLY 8CC  Final   Culture NO GROWTH < 24 HOURS  Final   Report Status PENDING  Incomplete  MRSA PCR Screening     Status: None   Collection Time: 09/03/15 12:16 AM  Result Value Ref Range Status   MRSA by PCR NEGATIVE NEGATIVE Final    Comment:        The GeneXpert MRSA Assay (FDA approved for NASAL specimens only), is one component of a comprehensive MRSA colonization surveillance program. It is not intended to diagnose MRSA infection nor to guide or monitor treatment for MRSA infections.       Radiology Studies: Dg Chest 2 View  09/02/2015  CLINICAL DATA:  65 year old male with a history of shortness of breath. Surgery 08/22/2015 with aortic valve replacement and 25 mm pericardial valve, left atrial clipping, left atrial Maze procedure EXAM: CHEST - 2 VIEW COMPARISON:  08/27/2015  FINDINGS: Cardiomediastinal silhouette likely unchanged though partially obscured by overlying lung and pleural disease on the right. Surgical changes of prior median sternotomy, aortic valve replacement, and atrial amputation. Cardiac pacing device on left chest wall unchanged. Previous pneumothorax is not visualized. Interstitial opacities with interlobular septal thickening and increasing right-sided pleural effusion. Opacity at  the right base obscures the right hemidiaphragm and right heart border. No displaced fracture. Unremarkable appearance of the upper abdomen. IMPRESSION: Bilateral interstitial opacities and interlobular septal thickening with right greater than left basilar opacity, potentially a combination of edema, atelectasis, and/or consolidation. Prior right-sided pneumothorax not visualized on the current study. Surgical changes of median sternotomy, aortic valve replacement, and left atrial amputation. Signed, Dulcy Fanny. Earleen Newport, DO [facility] Radiology Electronically Signed   By: Corrie Mckusick D.O.   On: 09/02/2015 21:07   Ct Head Wo Contrast  09/03/2015  CLINICAL DATA:  Hallucinations for several days EXAM: CT HEAD WITHOUT CONTRAST TECHNIQUE: Contiguous axial images were obtained from the base of the skull through the vertex without intravenous contrast. COMPARISON:  None. FINDINGS: There is age related volume loss. There is no intracranial mass, hemorrhage, extra-axial fluid collection, or midline shift. There is mild small vessel disease in the centra semiovale bilaterally. Elsewhere gray-white compartments appear normal. No acute infarct evident. The bony calvarium appears intact. The mastoid air cells are clear. No intraorbital lesions are present. IMPRESSION: Age related volume loss with mild periventricular small vessel disease. No intracranial mass, hemorrhage, or acute appearing infarct. Electronically Signed   By: Lowella Grip III  M.D.   On: 09/03/2015 09:20   Ct Chest Wo Contrast  09/03/2015  CLINICAL DATA:  Patient with shortness of breath. Bilateral pleural effusions. EXAM: CT CHEST WITHOUT CONTRAST TECHNIQUE: Multidetector CT imaging of the chest was performed following the standard protocol without IV contrast. COMPARISON:  Chest radiograph 09/02/2015 FINDINGS: Mediastinum/Nodes: Multi lead pacer apparatus overlies the left hemi thorax. Multiple prominent mediastinal lymph nodes are present measuring up to 11 mm within the subcarinal location (image 66; series 2) and 11 mm within the pretracheal location (image 40; series 2). Ascending thoracic aorta measures 4.3 cm. Heart is enlarged. No pericardial effusion. There a few small foci of gas anterior to the pericardial fat (image 101; series 2). Lungs/Pleura: Central airways are patent. Patchy ground-glass opacities throughout the lungs bilaterally. 2 mm left upper lobe pulmonary nodule (Image 21; series 9). Subpleural consolidation within the right middle and right lower lobes. Moderate right and small left pleural effusions. Upper abdomen: Masslike high attenuation within the stomach measuring up to 6 cm. Musculoskeletal: Patient status post median sternotomy. Mild stranding within the retrosternal fat likely postoperative in etiology. IMPRESSION: Moderate right and small left pleural effusions. Small amount of gas within the anterior right hemi thorax along the pericardial fat, likely postoperative in etiology. Patchy ground-glass opacities throughout the lungs bilaterally nonspecific however favored to represent pulmonary edema. Consolidation within the right middle and right lower lobe may represent atelectasis. Infection not excluded. Nonspecific high attenuation within the stomach may potentially represent ingested contents. Consider correlation with nonemergent upper GI to exclude the possibility of gastric mass. Interval increase in size of multiple mildly enlarged mediastinal  lymph nodes which are nonspecific however likely reactive in etiology given recent surgical history. 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 Recommend annual imaging followup by CTA or MRA. This recommendation follows 2010 ACCF/AHA/AATS/ACR/ASA/SCA/SCAI/SIR/STS/SVM Guidelines for the Diagnosis and Management of Patients with Thoracic Aortic Disease. Circulation. 2010; [id number] Electronically Signed   By: Lovey Newcomer M.D.   On: 09/03/2015 10:18     Scheduled Meds: . amiodarone  200 mg Oral Daily  . amLODipine  10 mg Oral Daily  . aspirin EC  81 mg Oral Daily  . atorvastatin  40 mg Oral q1800  .  budesonide  0.5 mg Nebulization BID  . ceFEPime (MAXIPIME) IV  1 g Intravenous Q8H  . furosemide  40 mg Intravenous BID  . insulin aspart  0-5 Units Subcutaneous QHS  . insulin aspart  0-9 Units Subcutaneous TID WC  . irbesartan  150 mg Oral Daily  . levalbuterol  0.63 mg Nebulization TID  . levothyroxine  175 mcg Oral QAC breakfast  . loratadine  10 mg Oral Daily  . metoprolol tartrate  25 mg Oral BID  . pantoprazole  40 mg Oral Daily  . potassium chloride  20 mEq Oral Daily  . rOPINIRole  1 mg Oral Daily  . spironolactone  12.5 mg Oral Daily  . vancomycin  750 mg Intravenous Q12H   Continuous Infusions:    LOS: 2 days    Time spent: 30 min    Janece Canterbury, MD [facility] Pager [phone]  If 7PM-7AM, please contact night-coverage [email or web address] Password [facility] 09/04/2015, 8:35 AM

## 2015-09-04 NOTE — Progress Notes (Signed)
  Subjective:  > 1 liter removed R thoracentesis WBC up 25k, ESR up 60- poss response to amiodarone-- will DC Pleural fluid sent for culture Cont iv antibiotics, iv lasix Objective: Vital signs in last 24 hours: Temp:  [97.5 F (36.4 C)-98.6 F (37 C)] 97.5 F (36.4 C) (05/08 1525) Pulse Rate:  [65-70] 69 (05/08 1136) Cardiac Rhythm:  [-] Ventricular paced (05/08 1200) Resp:  [18-20] 20 (05/08 1525) BP: (105-135)/(50-74) 129/63 mmHg (05/08 1525) SpO2:  [93 %-98 %] 97 % (05/08 1525)  Hemodynamic parameters for last 24 hours:  paced 70/min  Intake/Output from previous day: 05/07 0701 - 05/08 0700 In: 1160 [P.O.:960; IV Piggyback:200] Out: 800 [Urine:800] Intake/Output this shift: Total I/O In: 240 [P.O.:240] Out: 1125 [Urine:1125]  Scattered whezes  Lab Results:  Recent Labs  09/02/15 2005 09/04/15 0754  WBC 18.0* 25.7*  HGB 9.1* 8.7*  HCT 29.0* 27.6*  PLT 361 430*   BMET:  Recent Labs  09/03/15 0550 09/04/15 0754  NA 135 138  K 3.7 4.1  CL 96* 100*  CO2 25 28  GLUCOSE 171* 118*  BUN 25* 33*  CREATININE 1.58* 1.64*  CALCIUM 8.6* 8.5*    PT/INR:  Recent Labs  09/02/15 2011  LABPROT 23.2*  INR 2.08*   ABG    Component Value Date/Time   PHART 7.315* 08/23/2015 0023   HCO3 21.0 08/23/2015 0023   TCO2 26 08/23/2015 1659   ACIDBASEDEF 5.0* 08/23/2015 0023   O2SAT 94.0 08/23/2015 0023   CBG (last 3)   Recent Labs  09/03/15 2133 09/04/15 0826 09/04/15 1134  GLUCAP 115* 114* 98    Assessment/Plan: S/P   CXR in am Amiodarone stopped- poss pulmonary toxicity Back on pradaxa for afib  LOS: 2 days    Tharon Aquas Trigt III 09/04/2015

## 2015-09-04 NOTE — Procedures (Signed)
   US guided Rt thoracentesis  1 liter bloody fluid Sent for labs per MD  cxr pending Tolerated well

## 2015-09-05 ENCOUNTER — Inpatient Hospital Stay (HOSPITAL_COMMUNITY): Payer: Medicare Other

## 2015-09-05 DIAGNOSIS — R591 Generalized enlarged lymph nodes: Secondary | ICD-10-CM

## 2015-09-05 DIAGNOSIS — I712 Thoracic aortic aneurysm, without rupture: Secondary | ICD-10-CM

## 2015-09-05 DIAGNOSIS — R443 Hallucinations, unspecified: Secondary | ICD-10-CM

## 2015-09-05 LAB — BASIC METABOLIC PANEL
Anion gap: 12 (ref 5–15)
BUN: 31 mg/dL — ABNORMAL HIGH (ref 6–20)
CO2: 30 mmol/L (ref 22–32)
Calcium: 8.3 mg/dL — ABNORMAL LOW (ref 8.9–10.3)
Chloride: 97 mmol/L — ABNORMAL LOW (ref 101–111)
Creatinine, Ser: 1.46 mg/dL — ABNORMAL HIGH (ref 0.61–1.24)
GFR calc Af Amer: 56 mL/min — ABNORMAL LOW (ref >60)
GFR calc non Af Amer: 48 mL/min — ABNORMAL LOW (ref >60)
Glucose, Bld: 106 mg/dL — ABNORMAL HIGH (ref 65–99)
Potassium: 3.9 mmol/L (ref 3.5–5.1)
Sodium: 139 mmol/L (ref 135–145)

## 2015-09-05 LAB — GLUCOSE, CAPILLARY
Glucose-Capillary: 93 mg/dL (ref 65–99)
Glucose-Capillary: 113 mg/dL — ABNORMAL HIGH (ref 65–99)
Glucose-Capillary: 150 mg/dL — ABNORMAL HIGH (ref 65–99)
Glucose-Capillary: 139 mg/dL — ABNORMAL HIGH (ref 65–99)

## 2015-09-05 LAB — TROPONIN I
Troponin I: 0.15 ng/mL — ABNORMAL HIGH (ref <0.031)
Troponin I: 0.15 ng/mL — ABNORMAL HIGH (ref <0.031)
Troponin I: 0.14 ng/mL — ABNORMAL HIGH (ref <0.031)

## 2015-09-05 LAB — CBC
HCT: 28 % — ABNORMAL LOW (ref 39.0–52.0)
Hemoglobin: 9.1 g/dL — ABNORMAL LOW (ref 13.0–17.0)
MCH: 28.7 pg (ref 26.0–34.0)
MCHC: 32.5 g/dL (ref 30.0–36.0)
MCV: 88.3 fL (ref 78.0–100.0)
Platelets: 417 10*3/uL — ABNORMAL HIGH (ref 150–400)
RBC: 3.17 MIL/uL — ABNORMAL LOW (ref 4.22–5.81)
RDW: 16 % — ABNORMAL HIGH (ref 11.5–15.5)
WBC: 17 10*3/uL — ABNORMAL HIGH (ref 4.0–10.5)

## 2015-09-05 LAB — URINE CULTURE: Culture: NO GROWTH

## 2015-09-05 LAB — PROCALCITONIN: Procalcitonin: <0.1 ng/mL

## 2015-09-05 LAB — LACTATE DEHYDROGENASE: LDH: 240 U/L — ABNORMAL HIGH (ref 98–192)

## 2015-09-05 MED ORDER — DIPHENHYDRAMINE HCL 25 MG PO CAPS
25.0000 mg | ORAL_CAPSULE | Freq: Every evening | ORAL | Status: DC | PRN
  Administered 2015-09-05 (×2): 25 mg via ORAL
  Filled 2015-09-05 (×2): qty 1

## 2015-09-05 MED ORDER — FUROSEMIDE 10 MG/ML IJ SOLN
80.0000 mg | Freq: Two times a day (BID) | INTRAMUSCULAR | Status: DC
  Administered 2015-09-05 – 2015-09-06 (×3): 80 mg via INTRAVENOUS
  Filled 2015-09-05 (×3): qty 8

## 2015-09-05 NOTE — Progress Notes (Signed)
CARDIAC REHAB PHASE I   PRE:  Rate/Rhythm: 70 pacing    BP: sitting 134/66    SaO2: 99 2L, 100 RA  MODE:  Ambulation: 170 ft   POST:  Rate/Rhythm: 72 pacing    BP: sitting 132/68     SaO2: 92 RA, up to 99 Ra with rest  Pt able to stand independently however he had x2 LOB walking with RW. Assist x1.  Rest x1 walking due to SOB. Encouraged pursed lip breathing. SaO2 92 RA and maintained this. Short walk due to SOB, return to recliner. SaO2 up to 99 RA with rest. Had pt practice IS which he is only inspiring 750 mL. Will f/u. [id number]  [place], ACSM 09/05/2015 10:05 AM

## 2015-09-05 NOTE — Progress Notes (Signed)
PROGRESS NOTE  [name]  YTK:[id number] DOB: [birth year] DOA: 09/02/2015 PCP: Velna Hatchet, MD Outpatient Specialists:  Dr. Prescott Gum cardiothoracic Brief Narrative:   DELTON [name] is a 65 y.o. male with medical history significant of HTN, HLD, OSA, ventricular pacemaker, aortic stenosis s/p bioprosthetic valve replacement on 4/25, just discharged from hospital on 5/1.  Patient presented to the ED with worsening BLE edema, orthopnea, SOB, DOE. Home pulse ox at rest reading was 86%. No home O2 at baseline. Patients wife convinced him to come in for the oxygen level.  No fever, no chills.  Cough productive of yellow sputum.  Most likely has acute on chronic diastolic heart failure secondary to having his Lasix doctor in his previous admission and possible amiodarone toxicity. His amiodarone has been discontinued and he has started to diuresis. His course has been complicated by some delirium.    Assessment & Plan:   Principal Problem:   Hypoxia Active Problems:   Hypertension   S/P aortic valve replacement with bioprosthetic valve   Acute CHF (congestive heart failure) (HCC)   Pleural effusion   Hallucinations   Lymphadenopathy   Ascending aortic aneurysm (HCC)   Acute respiratory failure with hypoxia secondary to probable acute on chronic diastolic heart failure versus Amiodarone toxicity.  His Lasix was stopped and he was started on amiodarone after his valve replacement during his previous hospitalization.  -  Procalcitonin  < 0.1 x 2 -  US guided thoracentesis:  1L of blood fluid removed -  CT chest without contrast:  Pulmonary edema with possible consolidation vs. Atelectasis  -  Wt trending down -  -1.45L uop AND 1L of fluid removed with thoracentesis -  Increase lasix to 17m IV BID -  Continue BB and ACEI -  ECHO:  EF preserved.  Probable diastolic dysfunction with blunted IVC.  Aortic valve with mild to moderate regurgitation.   -  ESR 60 -  Amiodarone  discontinued on 09/04/2015   Bioprosthetic aortic valve replacement by Dr. VPrescott Gumon 08/22/2015.    Chronic atrial fibrillation s/p MAZE procedure and atrial appendage clipping on 08/22/2015.  S/p PPM placement.  CHADs2vasc = 3 (hypertension, diabetes, and age).   -  Pradaxa resumed, continue through 09/18/2015  Ascending aortic aneurysm 4.3cm -  Repeat CT chest 08/2016  Possible gastric mass and lymphadenopathy. Most likely, this reflects food in stomach and edema -  Upper GI negative for mass -  Repeat CT chest in 3 months -  LDH minimally elevated, doubt malignancy  Hallucinations, may be due to stroke, either hemorrhagic or ischemic given a-fib and anticoagulation.  Patient cannot get MRI due to pacemaker.  May also have some delirium -  CT head:  No acute disease -  Monitor for infections:  Pneumonia unlikely given low procalcitonin -  UA negative for UTI  Elevated troponins are only mildly elevated and are trending down.  Likely due to MAZE procedure, recent valve replacement, heart failure in setting of CKD. 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 -  ECG:  Ventricularly paced rhythm. Mild flattening of the T waves in V3 through V5. T-wave inversion in V6 and deeper T waves in the lateral leads compared to prior.  Essential hypertension, blood pressures low normal   Borderline diabetes mellitus, CBG improved -  Continue SSI  CKD stage 3, creatinine near baseline of 1.5 -  Minimize nephrotoxins and renally dose medications  Hypothyroidism, TSH 0.441 wnl, continue synthroid  Leukocytosis, no obvious source of infection.  Had solumedrol administered in ER. -  Doubt pneumonia, as procalcitonin repeated low -  UA and UCx neg -  Pleural fluid culture neg -  F/u AM CBC  Normocytic anemia, likely due to chronic disease, hemoglobin stable around 28m/dl -  TSH wnl -  Occult stool   DVT prophylaxis:  pradaxa Code Status:  full Family Communication:   Patient alone Disposition Plan:  Pending further diuresis, improvement in leukocytosis and confusion.     Consultants:   Cardiothoracic surgery, Dr. VPrescott Gum Procedures:  CT chest  UKoreaguided thoracentesis CT head  ECHO  Antimicrobials:   Vancomycin 5/6 > 5/8   Zosyn 5/6  Cefepime 5/6 > 5/8   Subjective:  Continues to feel better. Able to take deeper breaths. Abdomen feels softer, less bloated. Lower extremity edema is better. Denies chest pains.  Objective: Filed Vitals:   09/05/15 0913 09/05/15 1000 09/05/15 1113 09/05/15 1136  BP: 127/53 132/68 135/116   Pulse: 70 72    Temp:    97.4 F (36.3 C)  TempSrc:    Oral  Resp:      Height:      Weight:      SpO2:  81% 92%     Intake/Output Summary (Last 24 hours) at 09/05/15 1314 Last data filed at 09/05/15 1114  Gross per 24 hour  Intake    540 ml  Output   1625 ml  Net  -1085 ml   Filed Weights   09/03/15 0039 09/03/15 0303 09/05/15 0421  Weight: 105.3 kg (232 lb 2.3 oz) 97.4 kg (214 lb 11.7 oz) 94.5 kg (208 lb 5.4 oz)    Examination:  General exam:  Adult male.  No acute distress.  Appears calm and comfortable, sitting in chair HEENT:  NCAT, MMM Respiratory system:  Diminished at the right base, no rales, faint wheezes, no rhonchi .  Respiratory effort normal. Cardiovascular system: RRR. 2/6 systolic murmur at the right upper sternal border.  Warm extremities Gastrointestinal system: Abdomen is distended, soft and nontender. Normal bowel sounds. Skin: No rashes, lesions or ulcers MSK:  Normal tone and bulk, 1+ pitting bilateral lower extremity edema Neuro:  CN II-XII grossly intact, strength 5/5, SITLT Psychiatry: Mood & affect appropriate.  Oriented x 3.     Data Reviewed: I have personally reviewed following labs and imaging studies  CBC:  Recent Labs Lab 09/02/15 2005 09/04/15 0754 09/05/15 0627  WBC 18.0* 25.7* 17.0*  NEUTROABS 15.4*  --   --   HGB 9.1* 8.7* 9.1*  HCT 29.0* 27.6*  28.0*  MCV 85.3 84.4 88.3  PLT 361 430* 4662   Basic Metabolic Panel:  Recent Labs Lab 09/02/15 2005 09/03/15 0550 09/04/15 0754 09/05/15 0627  NA 135 135 138 139  K 3.9 3.7 4.1 3.9  CL 97* 96* 100* 97*  CO2 '25 25 28 30  ' GLUCOSE 129* 171* 118* 106*  BUN 23* 25* 33* 31*  CREATININE 1.50* 1.58* 1.64* 1.46*  CALCIUM 8.7* 8.6* 8.5* 8.3*   GFR: Estimated Creatinine Clearance: 58.4 mL/min (by C-G formula based on Cr of 1.46). Liver Function Tests:  Recent Labs Lab 09/02/15 2005  AST 30  ALT 28  ALKPHOS 108  BILITOT 1.3*  PROT 6.5  ALBUMIN 2.8*   No results for input(s): LIPASE, AMYLASE in the last 168 hours. No results for input(s): AMMONIA in the last 168 hours. Coagulation Profile:  Recent Labs Lab 09/02/15 2011  INR 2.08*  Cardiac Enzymes:  Recent Labs Lab 09/04/15 0754 09/04/15 1258 09/04/15 1845 09/05/15 0056 09/05/15 0627  TROPONINI 0.15* 0.14* 0.15* 0.15* 0.15*   BNP (last 3 results) No results for input(s): PROBNP in the last 8760 hours. HbA1C: No results for input(s): HGBA1C in the last 72 hours. CBG:  Recent Labs Lab 09/04/15 1134 09/04/15 1703 09/04/15 2058 09/05/15 0749 09/05/15 1118  GLUCAP 98 123* 100* 93 113*   Lipid Profile: No results for input(s): CHOL, HDL, LDLCALC, TRIG, CHOLHDL, LDLDIRECT in the last 72 hours. Thyroid Function Tests:  Recent Labs  09/04/15 0754  TSH 0.441   Anemia Panel:  Recent Labs  09/04/15 0754  VITAMINB12 384  FOLATE 6.5  FERRITIN 738*  TIBC 392  IRON 30*   Urine analysis:    Component Value Date/Time   COLORURINE YELLOW 09/03/2015 2333   APPEARANCEUR CLEAR 09/03/2015 2333   LABSPEC 1.015 09/03/2015 2333   PHURINE 5.5 09/03/2015 2333   GLUCOSEU NEGATIVE 09/03/2015 2333   HGBUR NEGATIVE 09/03/2015 2333   Robinson NEGATIVE 09/03/2015 2333   KETONESUR NEGATIVE 09/03/2015 2333   PROTEINUR NEGATIVE 09/03/2015 2333   NITRITE NEGATIVE 09/03/2015 2333   LEUKOCYTESUR NEGATIVE  09/03/2015 2333   Sepsis Labs: '@LABRCNTIP' (procalcitonin:4,lacticidven:4)  ) Recent Results (from the past 240 hour(s))  Blood culture (routine x 2)     Status: None (Preliminary result)   Collection Time: 09/02/15  9:16 PM  Result Value Ref Range Status   Specimen Description BLOOD RIGHT ANTECUBITAL  Final   Special Requests IN PEDIATRIC BOTTLE 3CC  Final   Culture NO GROWTH 2 DAYS  Final   Report Status PENDING  Incomplete  Blood culture (routine x 2)     Status: None (Preliminary result)   Collection Time: 09/02/15  9:20 PM  Result Value Ref Range Status   Specimen Description BLOOD LEFT ANTECUBITAL  Final   Special Requests BOTTLES DRAWN AEROBIC ONLY 8CC  Final   Culture NO GROWTH 2 DAYS  Final   Report Status PENDING  Incomplete  MRSA PCR Screening     Status: None   Collection Time: 09/03/15 12:16 AM  Result Value Ref Range Status   MRSA by PCR NEGATIVE NEGATIVE Final    Comment:        The GeneXpert MRSA Assay (FDA approved for NASAL specimens only), is one component of a comprehensive MRSA colonization surveillance program. It is not intended to diagnose MRSA infection nor to guide or monitor treatment for MRSA infections.   Culture, Urine     Status: None   Collection Time: 09/03/15 11:33 PM  Result Value Ref Range Status   Specimen Description URINE, CLEAN CATCH  Final   Special Requests NONE  Final   Culture NO GROWTH 2 DAYS  Final   Report Status 09/05/2015 FINAL  Final  Gram stain     Status: None   Collection Time: 09/04/15 10:24 AM  Result Value Ref Range Status   Specimen Description FLUID RIGHT PLEURAL  Final   Special Requests NONE  Final   Gram Stain   Final    RARE WBC PRESENT, PREDOMINANTLY PMN NO ORGANISMS SEEN    Report Status 09/04/2015 FINAL  Final      Radiology Studies: Dg Chest 1 View  09/04/2015  CLINICAL DATA:  Status post right-sided thoracentesis EXAM: CHEST 1 VIEW COMPARISON:  Chest radiograph Sep 02, 2015 and chest CT Sep 03, 2015 FINDINGS: No demonstrable pneumothorax. Effusion on the right is smaller post thoracentesis. Small residual effusion  remains on the right. There is patchy bibasilar atelectasis with minimal left pleural effusion. Lungs elsewhere clear. Heart is upper normal in size with pulmonary vascular within normal limits. Pacemaker leads are attached to the right ventricle and right atrium. Left atrial appendage clamp present. No bone lesions. IMPRESSION: No pneumothorax. Right effusion smaller after thoracentesis. Minimal left effusion. Atelectasis remains in the bases, slightly more on the right than on the left. Stable cardiac silhouette. Electronically Signed   By: Lowella Grip III M.D.   On: 09/04/2015 10:52   Dg Chest 2 View  09/05/2015  CLINICAL DATA:  Four days of chest congestion, shortness of breath since aortic valve replacement and Maze procedure of August 22, 2015. EXAM: CHEST  2 VIEW COMPARISON:  Portable chest x-ray of Sep 04, 2015 and PA and lateral chest x-ray of Sep 17, 2015. FINDINGS: The lungs are well-expanded. There is a small right pleural effusion and trace left pleural effusion. There is no pneumothorax. The interstitial markings of both lungs are increased. The cardiac silhouette is top-normal in size. The central pulmonary vascularity is mildly prominent. The left atrial appendage clip is in stable position. The prosthetic aortic valve ring appears appropriately positioned. The permanent pacemaker generator and electrodes appear to be appropriately positioned. The sternal wires are intact. The thoracic spine exhibits disc space narrowing at multiple levels. IMPRESSION: CHF superimposed upon COPD. Small right pleural effusion. There has not been significant interval change since yesterday's study. Electronically Signed   By: David  Martinique M.D.   On: 09/05/2015 08:12   Dg Duanne Limerick  W/kub  09/04/2015  CLINICAL DATA:  Possible gastric mass seen on CT 09/03/2015 EXAM: UPPER GI SERIES WITH KUB  TECHNIQUE: After obtaining a scout radiograph a routine upper GI series was performed using thin barium FLUOROSCOPY TIME:  Radiation Exposure Index (as provided by the fluoroscopic device): 300 microGy*m^2 COMPARISON:  09/03/2015 FINDINGS: Noncontrast chest CT showed dependent high density in the stomach of uncertain etiology. Initial KUB demonstrates gas throughout the large and small bowel without overtly dilated small bowel. Trace right pleural effusions suspected. Aortic valve prosthesis with atrial clip noted. The patient is recently postoperative for open heart surgery, and accordingly is not able to perform all of the maneuvering and prone positioning that is part of a typical upper GI exam. We performed a single contrast exam with the patient primarily in the prone and posterior oblique positions. This can make it difficult to coat all of the sides of the stomach, but of course we are easily able to coat the posterior stomach were there was concern on the CT. We demonstrate abnormal morphology and appearance of the stomach. Primary peristaltic waves are generally intact and no esophageal stricture is seen. The anterior portion of the distal stomach antrum and body was difficult to coat. I did use a balloon compression to obtain compressed views of the stomach although I was a bit limited in how heart activate compress due to the recent median sternotomy. No gastric mass is identified, particularly in the vicinity of concern along the fundus and posterior body. contrast medium extended into the duodenum and jejunum. IMPRESSION: 1. No gastric mass is identified. Electronically Signed   By: Van Clines M.D.   On: 09/04/2015 14:31   US Thoracentesis Asp Pleural Space W/img Guide  09/04/2015  INDICATION: Symptomatic right sided pleural effusion EXAM: US THORACENTESIS ASP PLEURAL SPACE W/IMG GUIDE COMPARISON:  None. MEDICATIONS: 10 cc 1% lidocaine. COMPLICATIONS: None immediate. TECHNIQUE: Informed  written consent  was obtained from the patient after a discussion of the risks, benefits and alternatives to treatment. A timeout was performed prior to the initiation of the procedure. Initial ultrasound scanning demonstrates a right pleural effusion. The lower chest was prepped and draped in the usual sterile fashion. 1% lidocaine was used for local anesthesia. Under direct ultrasound guidance, a 19 gauge, 7-cm, Yueh catheter was introduced. An ultrasound image was saved for documentation purposes. The thoracentesis was performed. The catheter was removed and a dressing was applied. The patient tolerated the procedure well without immediate post procedural complication. The patient was escorted to have an upright chest radiograph. FINDINGS: A total of approximately 1 liters of bloody fluid was removed. Requested samples were sent to the laboratory. IMPRESSION: Successful ultrasound-guided right sided thoracentesis yielding 1 liters of pleural fluid. Read by:  Lavonia Drafts [facility] Electronically Signed   By: Jerilynn Mages.  Shick M.D.   On: 09/04/2015 10:49     Scheduled Meds: . amLODipine  10 mg Oral Daily  . aspirin EC  81 mg Oral Daily  . atorvastatin  40 mg Oral q1800  . budesonide  0.5 mg Nebulization BID  . dabigatran  150 mg Oral Q12H  . furosemide  40 mg Intravenous BID  . gabapentin  600 mg Oral QHS  . insulin aspart  0-5 Units Subcutaneous QHS  . insulin aspart  0-9 Units Subcutaneous TID WC  . irbesartan  150 mg Oral Daily  . levothyroxine  175 mcg Oral QAC breakfast  . loratadine  10 mg Oral Daily  . metoprolol tartrate  25 mg Oral BID  . pantoprazole  40 mg Oral Daily  . potassium chloride  20 mEq Oral Daily  . rOPINIRole  1 mg Oral Daily  . spironolactone  12.5 mg Oral Daily   Continuous Infusions:    LOS: 3 days    Time spent: 30 min    Janece Canterbury, MD [facility] Pager [phone]  If 7PM-7AM, please contact night-coverage [email or web address] Password [email or web address] 09/05/2015,  1:14 PM

## 2015-09-05 NOTE — Care Management Important Message (Signed)
Important Message  Patient Details  Name: [name] MRN: [id number] Date of Birth: Mar 04, 1951   Medicare Important Message Given:  Yes    Lacretia Leigh, RN 09/05/2015, 9:53 AM

## 2015-09-06 LAB — BASIC METABOLIC PANEL
Anion gap: 14 (ref 5–15)
BUN: 26 mg/dL — ABNORMAL HIGH (ref 6–20)
CO2: 26 mmol/L (ref 22–32)
Calcium: 8.1 mg/dL — ABNORMAL LOW (ref 8.9–10.3)
Chloride: 98 mmol/L — ABNORMAL LOW (ref 101–111)
Creatinine, Ser: 1.3 mg/dL — ABNORMAL HIGH (ref 0.61–1.24)
GFR calc Af Amer: >60 mL/min (ref >60)
GFR calc non Af Amer: 56 mL/min — ABNORMAL LOW (ref >60)
Glucose, Bld: 103 mg/dL — ABNORMAL HIGH (ref 65–99)
Potassium: 4.1 mmol/L (ref 3.5–5.1)
Sodium: 138 mmol/L (ref 135–145)

## 2015-09-06 LAB — CBC
HCT: 29.3 % — ABNORMAL LOW (ref 39.0–52.0)
Hemoglobin: 9.2 g/dL — ABNORMAL LOW (ref 13.0–17.0)
MCH: 26.7 pg (ref 26.0–34.0)
MCHC: 31.4 g/dL (ref 30.0–36.0)
MCV: 85.2 fL (ref 78.0–100.0)
Platelets: 344 10*3/uL (ref 150–400)
RBC: 3.44 MIL/uL — ABNORMAL LOW (ref 4.22–5.81)
RDW: 15.9 % — ABNORMAL HIGH (ref 11.5–15.5)
WBC: 14 10*3/uL — ABNORMAL HIGH (ref 4.0–10.5)

## 2015-09-06 LAB — GLUCOSE, CAPILLARY
Glucose-Capillary: 109 mg/dL — ABNORMAL HIGH (ref 65–99)
Glucose-Capillary: 92 mg/dL (ref 65–99)
Glucose-Capillary: 121 mg/dL — ABNORMAL HIGH (ref 65–99)
Glucose-Capillary: 119 mg/dL — ABNORMAL HIGH (ref 65–99)

## 2015-09-06 MED ORDER — FUROSEMIDE 40 MG PO TABS
40.0000 mg | ORAL_TABLET | Freq: Two times a day (BID) | ORAL | Status: DC
  Administered 2015-09-07 – 2015-09-09 (×5): 40 mg via ORAL
  Filled 2015-09-06 (×5): qty 1

## 2015-09-06 NOTE — Progress Notes (Signed)
  Subjective: Patient resting comfortably in his chair on room air Weight continues to decrease on twice a day diuresis Right pleural fluid cultures remained negative-white count almost normal at 14,000-antibiotics have been stopped Patient's pulmonary status appears to be improving with diuretics for probable diastolic heart dysfunction. He is Ambulating now with cardiac rehabilitation but does not appear to be strong enough yet for discharge We'll follow patient office with chest x-ray later this month  Objective: Vital signs in last 24 hours: Temp:  [97.4 F (36.3 C)-98.2 F (36.8 C)] 97.9 F (36.6 C) (05/10 0453) Pulse Rate:  [70] 70 (05/10 0453) Cardiac Rhythm:  [-] Ventricular paced (05/10 0700) Resp:  [18] 18 (05/09 2210) BP: (123-138)/(45-116) 138/76 mmHg (05/10 0453) SpO2:  [90 %-96 %] 90 % (05/10 0453) Weight:  [205 lb 0.4 oz (93 kg)] 205 lb 0.4 oz (93 kg) (05/10 0453)  Hemodynamic parameters for last 24 hours:  stable  Intake/Output from previous day: 05/09 0701 - 05/10 0700 In: 300 [P.O.:300] Out: 1525 [Urine:1525] Intake/Output this shift: Total I/O In: 120 [P.O.:120] Out: -   Lungs clear Soft flow murmur through the biologic aVR without diastolic murmur Improved tibial edema  Lab Results:  Recent Labs  09/05/15 0627 09/06/15 0400  WBC 17.0* 14.0*  HGB 9.1* 9.2*  HCT 28.0* 29.3*  PLT 417* 344   BMET:  Recent Labs  09/05/15 0627 09/06/15 0400  NA 139 138  K 3.9 4.1  CL 97* 98*  CO2 30 26  GLUCOSE 106* 103*  BUN 31* 26*  CREATININE 1.46* 1.30*  CALCIUM 8.3* 8.1*    PT/INR: No results for input(s): LABPROT, INR in the last 72 hours. ABG    Component Value Date/Time   PHART 7.315* 08/23/2015 0023   HCO3 21.0 08/23/2015 0023   TCO2 26 08/23/2015 1659   ACIDBASEDEF 5.0* 08/23/2015 0023   O2SAT 94.0 08/23/2015 0023   CBG (last 3)   Recent Labs  09/05/15 1706 09/05/15 2222 09/06/15 0645  GLUCAP 150* 139* 109*     Assessment/Plan: S/P   Mobilize Diuresis   LOS: 4 days    Tharon Aquas Trigt III 09/06/2015

## 2015-09-06 NOTE — Progress Notes (Signed)
PROGRESS NOTE  [name]  LNL:[id number] DOB: [birth year] DOA: 09/02/2015 PCP: Velna Hatchet, MD Outpatient Specialists:  Dr. Prescott Gum cardiothoracic Brief Narrative:   [name] is a 65 y.o. male with medical history significant of HTN, HLD, OSA, ventricular pacemaker, aortic stenosis s/p bioprosthetic valve replacement on 4/25, just discharged from hospital on 5/1.  Patient presented to the ED with worsening BLE edema, orthopnea, SOB, DOE. Home pulse ox at rest reading was 86%. No home O2 at baseline. Patients wife convinced him to come in for the oxygen level.  No fever, no chills.  Cough productive of yellow sputum.  Most likely has acute on chronic diastolic heart failure secondary to having his Lasix doctor in his previous admission and possible amiodarone toxicity. His amiodarone has been discontinued and he has started to diuresis. His course has been complicated by some delirium.    Assessment & Plan:   Principal Problem:   Hypoxia Active Problems:   Hypertension   S/P aortic valve replacement with bioprosthetic valve   Acute CHF (congestive heart failure) (HCC)   Pleural effusion   Hallucinations   Lymphadenopathy   Ascending aortic aneurysm (HCC)   Acute respiratory failure with hypoxia secondary to probable acute on chronic diastolic heart failure versus Amiodarone toxicity.  His Lasix was stopped and he was started on amiodarone after his valve replacement during his previous hospitalization.  -  Procalcitonin  < 0.1 x 2 -  US guided thoracentesis:  1L of blood fluid removed -  CT chest without contrast:  Pulmonary edema with possible consolidation vs. Atelectasis  -  Wt trending down -  -1.45L uop AND 1L of fluid removed with thoracentesis -  Place on oral lasix regimen. -  Continue BB and ACEI -  ECHO:  EF preserved.  Probable diastolic dysfunction with blunted IVC.  Aortic valve with mild to moderate regurgitation.   -  ESR 60 -  Amiodarone  discontinued on 09/04/2015   Bioprosthetic aortic valve replacement by Dr. Prescott Gum on 08/22/2015.    Chronic atrial fibrillation s/p MAZE procedure and atrial appendage clipping on 08/22/2015.  S/p PPM placement.  CHADs2vasc = 3 (hypertension, diabetes, and age).   -  Pradaxa resumed, continue through 09/18/2015  Ascending aortic aneurysm 4.3cm -  Repeat CT chest 08/2016  Possible gastric mass and lymphadenopathy. Most likely, this reflects food in stomach and edema -  Upper GI negative for mass -  Repeat CT chest in 3 months -  LDH minimally elevated, doubt malignancy  Hallucinations, may be due to stroke, either hemorrhagic or ischemic given a-fib and anticoagulation.  Patient cannot get MRI due to pacemaker.  May also have some delirium -  CT head:  No acute disease -  Monitor for infections:  Pneumonia unlikely given low procalcitonin -  UA negative for UTI  Elevated troponins are only mildly elevated and are trending down.  Likely due to MAZE procedure, recent valve replacement, heart failure in setting of CKD. 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 -  ECG:  Ventricularly paced rhythm. Mild flattening of the T waves in V3 through V5. T-wave inversion in V6 and deeper T waves in the lateral leads compared to prior.  Essential hypertension, blood pressures low normal   Borderline diabetes mellitus, CBG improved -  Continue SSI  CKD stage 3, creatinine near baseline of 1.5 -  Minimize nephrotoxins and renally dose medications  Hypothyroidism, TSH 0.441 wnl, continue synthroid  Leukocytosis,  no obvious source of infection.  Had solumedrol administered in ER. -  Doubt pneumonia, as procalcitonin repeated low -  UA and UCx neg -  Pleural fluid culture neg -  F/u AM CBC  Normocytic anemia, likely due to chronic disease, hemoglobin stable around 81m/dl -  TSH wnl -  Occult stool   DVT prophylaxis:  pradaxa Code Status:  full Family Communication:   Patient  Disposition Plan:  Most likely next a.m.   Consultants:   Cardiothoracic surgery, Dr. VPrescott Gum Procedures:  CT chest  UKoreaguided thoracentesis CT head  ECHO  Antimicrobials:   Vancomycin 5/6 > 5/8   Zosyn 5/6  Cefepime 5/6 > 5/8   Subjective:  Continues has no new complaints. He said his breathing is improved.  Objective: Filed Vitals:   09/05/15 2212 09/06/15 0453 09/06/15 1033 09/06/15 1317  BP: 131/58 138/76 127/57 106/50  Pulse:  70 70 69  Temp:  97.9 F (36.6 C)  98.3 F (36.8 C)  TempSrc:  Oral  Oral  Resp:    18  Height:      Weight:  93 kg (205 lb 0.4 oz)    SpO2:  90%  97%    Intake/Output Summary (Last 24 hours) at 09/06/15 1847 Last data filed at 09/06/15 1300  Gross per 24 hour  Intake    360 ml  Output   1175 ml  Net   -815 ml   Filed Weights   09/03/15 0303 09/05/15 0421 09/06/15 0453  Weight: 97.4 kg (214 lb 11.7 oz) 94.5 kg (208 lb 5.4 oz) 93 kg (205 lb 0.4 oz)    Examination:  General exam:  Adult male.  No acute distress.  Appears calm and comfortable, sitting in chair, Awake alert HEENT:  NCAT, MMM Respiratory system:  Diminished at the right base, no rales,no wheezes, no rhonchi .  Respiratory effort normal.  Cardiovascular system: RRR. 2/6 systolic murmur at the right upper sternal border.  Warm extremities Gastrointestinal system: Abdomen is distended, soft and nontender. Normal bowel sounds. Skin: No rashes, lesions or ulcers MSK:  Normal tone and bulk, 1+ pitting bilateral lower extremity edema Neuro:  Answers questions appropriately, moves extremities equal,  Psychiatry: Mood & affect appropriate.  Oriented x 3.     Data Reviewed: I have personally reviewed following labs and imaging studies  CBC:  Recent Labs Lab 09/02/15 2005 09/04/15 0754 09/05/15 0627 09/06/15 0400  WBC 18.0* 25.7* 17.0* 14.0*  NEUTROABS 15.4*  --   --   --   HGB 9.1* 8.7* 9.1* 9.2*  HCT 29.0* 27.6* 28.0* 29.3*  MCV 85.3 84.4 88.3  85.2  PLT 361 430* 417* 3294  Basic Metabolic Panel:  Recent Labs Lab 09/02/15 2005 09/03/15 0550 09/04/15 0754 09/05/15 0627 09/06/15 0400  NA 135 135 138 139 138  K 3.9 3.7 4.1 3.9 4.1  CL 97* 96* 100* 97* 98*  CO2 '25 25 28 30 26  ' GLUCOSE 129* 171* 118* 106* 103*  BUN 23* 25* 33* 31* 26*  CREATININE 1.50* 1.58* 1.64* 1.46* 1.30*  CALCIUM 8.7* 8.6* 8.5* 8.3* 8.1*   GFR: Estimated Creatinine Clearance: 65.1 mL/min (by C-G formula based on Cr of 1.3). Liver Function Tests:  Recent Labs Lab 09/02/15 2005  AST 30  ALT 28  ALKPHOS 108  BILITOT 1.3*  PROT 6.5  ALBUMIN 2.8*   No results for input(s): LIPASE, AMYLASE in the last 168 hours. No results for input(s): AMMONIA in the last  168 hours. Coagulation Profile:  Recent Labs Lab 09/02/15 2011  INR 2.08*   Cardiac Enzymes:  Recent Labs Lab 09/04/15 1258 09/04/15 1845 09/05/15 0056 09/05/15 0627 09/05/15 1238  TROPONINI 0.14* 0.15* 0.15* 0.15* 0.14*   BNP (last 3 results) No results for input(s): PROBNP in the last 8760 hours. HbA1C: No results for input(s): HGBA1C in the last 72 hours. CBG:  Recent Labs Lab 09/05/15 1706 09/05/15 2222 09/06/15 0645 09/06/15 1122 09/06/15 1645  GLUCAP 150* 139* 109* 92 121*   Lipid Profile: No results for input(s): CHOL, HDL, LDLCALC, TRIG, CHOLHDL, LDLDIRECT in the last 72 hours. Thyroid Function Tests:  Recent Labs  09/04/15 0754  TSH 0.441   Anemia Panel:  Recent Labs  09/04/15 0754  VITAMINB12 384  FOLATE 6.5  FERRITIN 738*  TIBC 392  IRON 30*   Urine analysis:    Component Value Date/Time   COLORURINE YELLOW 09/03/2015 2333   APPEARANCEUR CLEAR 09/03/2015 2333   LABSPEC 1.015 09/03/2015 2333   PHURINE 5.5 09/03/2015 2333   GLUCOSEU NEGATIVE 09/03/2015 2333   HGBUR NEGATIVE 09/03/2015 2333   [place] NEGATIVE 09/03/2015 2333   KETONESUR NEGATIVE 09/03/2015 2333   PROTEINUR NEGATIVE 09/03/2015 2333   NITRITE NEGATIVE 09/03/2015 2333    LEUKOCYTESUR NEGATIVE 09/03/2015 2333   Sepsis Labs: '@LABRCNTIP' (procalcitonin:4,lacticidven:4)  ) Recent Results (from the past 240 hour(s))  Blood culture (routine x 2)     Status: None (Preliminary result)   Collection Time: 09/02/15  9:16 PM  Result Value Ref Range Status   Specimen Description BLOOD RIGHT ANTECUBITAL  Final   Special Requests IN PEDIATRIC BOTTLE 3CC  Final   Culture NO GROWTH 4 DAYS  Final   Report Status PENDING  Incomplete  Blood culture (routine x 2)     Status: None (Preliminary result)   Collection Time: 09/02/15  9:20 PM  Result Value Ref Range Status   Specimen Description BLOOD LEFT ANTECUBITAL  Final   Special Requests BOTTLES DRAWN AEROBIC ONLY 8CC  Final   Culture NO GROWTH 4 DAYS  Final   Report Status PENDING  Incomplete  MRSA PCR Screening     Status: None   Collection Time: 09/03/15 12:16 AM  Result Value Ref Range Status   MRSA by PCR NEGATIVE NEGATIVE Final    Comment:        The GeneXpert MRSA Assay (FDA approved for NASAL specimens only), is one component of a comprehensive MRSA colonization surveillance program. It is not intended to diagnose MRSA infection nor to guide or monitor treatment for MRSA infections.   Culture, Urine     Status: None   Collection Time: 09/03/15 11:33 PM  Result Value Ref Range Status   Specimen Description URINE, CLEAN CATCH  Final   Special Requests NONE  Final   Culture NO GROWTH 2 DAYS  Final   Report Status 09/05/2015 FINAL  Final  Culture, body fluid-bottle     Status: None (Preliminary result)   Collection Time: 09/04/15 10:24 AM  Result Value Ref Range Status   Specimen Description FLUID RIGHT PLEURAL  Final   Special Requests BOTTLES DRAWN AEROBIC AND ANAEROBIC 10CC  Final   Culture NO GROWTH 2 DAYS  Final   Report Status PENDING  Incomplete  Gram stain     Status: None   Collection Time: 09/04/15 10:24 AM  Result Value Ref Range Status   Specimen Description FLUID RIGHT PLEURAL  Final    Special Requests NONE  Final   Gram  Stain   Final    RARE WBC PRESENT, PREDOMINANTLY PMN NO ORGANISMS SEEN    Report Status 09/04/2015 FINAL  Final      Radiology Studies: Dg Chest 2 View  09/05/2015  CLINICAL DATA:  Four days of chest congestion, shortness of breath since aortic valve replacement and Maze procedure of August 22, 2015. EXAM: CHEST  2 VIEW COMPARISON:  Portable chest x-ray of Sep 04, 2015 and PA and lateral chest x-ray of Sep 17, 2015. FINDINGS: The lungs are well-expanded. There is a small right pleural effusion and trace left pleural effusion. There is no pneumothorax. The interstitial markings of both lungs are increased. The cardiac silhouette is top-normal in size. The central pulmonary vascularity is mildly prominent. The left atrial appendage clip is in stable position. The prosthetic aortic valve ring appears appropriately positioned. The permanent pacemaker generator and electrodes appear to be appropriately positioned. The sternal wires are intact. The thoracic spine exhibits disc space narrowing at multiple levels. IMPRESSION: CHF superimposed upon COPD. Small right pleural effusion. There has not been significant interval change since yesterday's study. Electronically Signed   By: David  Martinique M.D.   On: 09/05/2015 08:12     Scheduled Meds: . amLODipine  10 mg Oral Daily  . aspirin EC  81 mg Oral Daily  . atorvastatin  40 mg Oral q1800  . budesonide  0.5 mg Nebulization BID  . dabigatran  150 mg Oral Q12H  . furosemide  80 mg Intravenous BID  . gabapentin  600 mg Oral QHS  . insulin aspart  0-5 Units Subcutaneous QHS  . insulin aspart  0-9 Units Subcutaneous TID WC  . irbesartan  150 mg Oral Daily  . levothyroxine  175 mcg Oral QAC breakfast  . loratadine  10 mg Oral Daily  . metoprolol tartrate  25 mg Oral BID  . pantoprazole  40 mg Oral Daily  . potassium chloride  20 mEq Oral Daily  . rOPINIRole  1 mg Oral Daily  . spironolactone  12.5 mg Oral Daily    Continuous Infusions:    LOS: 4 days    Time spent: 35 min    Velvet Bathe, MD [facility] Pager [phone]  If 7PM-7AM, please contact night-coverage [email or web address] Password [facility] 09/06/2015, 6:47 PM

## 2015-09-06 NOTE — Progress Notes (Signed)
CARDIAC REHAB PHASE I   PRE:  Rate/Rhythm: 70 pacing    BP: sitting 128/65    SaO2: 95 RA  MODE:  Ambulation: 350 ft   POST:  Rate/Rhythm: 70 pacing    BP: sitting 125/50     SaO2: 97 RA  Pt eating [place] biscuit on my arrival. Discussed sodium and also DM diet/carb counting. Voiced understanding, wife present and receptive. Left diet sheets. Used RW, steadier today. Increased distance but was tired at end of walk. Will continue to follow. He needs RW for home. [id number]  [place], ACSM 09/06/2015 12:03 PM

## 2015-09-07 LAB — BASIC METABOLIC PANEL
Anion gap: 12 (ref 5–15)
BUN: 21 mg/dL — ABNORMAL HIGH (ref 6–20)
CO2: 34 mmol/L — ABNORMAL HIGH (ref 22–32)
Calcium: 9 mg/dL (ref 8.9–10.3)
Chloride: 93 mmol/L — ABNORMAL LOW (ref 101–111)
Creatinine, Ser: 1.43 mg/dL — ABNORMAL HIGH (ref 0.61–1.24)
GFR calc Af Amer: 57 mL/min — ABNORMAL LOW (ref >60)
GFR calc non Af Amer: 50 mL/min — ABNORMAL LOW (ref >60)
Glucose, Bld: 113 mg/dL — ABNORMAL HIGH (ref 65–99)
Potassium: 4.3 mmol/L (ref 3.5–5.1)
Sodium: 139 mmol/L (ref 135–145)

## 2015-09-07 LAB — GLUCOSE, CAPILLARY
Glucose-Capillary: 99 mg/dL (ref 65–99)
Glucose-Capillary: 91 mg/dL (ref 65–99)
Glucose-Capillary: 97 mg/dL (ref 65–99)
Glucose-Capillary: 90 mg/dL (ref 65–99)
Glucose-Capillary: 112 mg/dL — ABNORMAL HIGH (ref 65–99)

## 2015-09-07 LAB — CULTURE, BLOOD (ROUTINE X 2)
Culture: NO GROWTH
Culture: NO GROWTH

## 2015-09-07 LAB — CBC
HCT: 29.9 % — ABNORMAL LOW (ref 39.0–52.0)
Hemoglobin: 9.7 g/dL — ABNORMAL LOW (ref 13.0–17.0)
MCH: 28.4 pg (ref 26.0–34.0)
MCHC: 32.4 g/dL (ref 30.0–36.0)
MCV: 87.7 fL (ref 78.0–100.0)
Platelets: 453 10*3/uL — ABNORMAL HIGH (ref 150–400)
RBC: 3.41 MIL/uL — ABNORMAL LOW (ref 4.22–5.81)
RDW: 15.9 % — ABNORMAL HIGH (ref 11.5–15.5)
WBC: 14.6 10*3/uL — ABNORMAL HIGH (ref 4.0–10.5)

## 2015-09-07 LAB — PROCALCITONIN: Procalcitonin: <0.1 ng/mL

## 2015-09-07 NOTE — Progress Notes (Signed)
RN returned pt daughters call and updated her on plan of care. RN will continue to monitor.  Arnell Sieving, RN

## 2015-09-07 NOTE — Progress Notes (Signed)
PROGRESS NOTE  [name]  DPO:[id number] DOB: 12-21-1950 DOA: 09/02/2015 PCP: Velna Hatchet, MD Outpatient Specialists:  Dr. Prescott Gum cardiothoracic Brief Narrative:   [name] is a 64 y.o. male with medical history significant of HTN, HLD, OSA, ventricular pacemaker, aortic stenosis s/p bioprosthetic valve replacement on 4/25, just discharged from hospital on 5/1.  Patient presented to the ED with worsening BLE edema, orthopnea, SOB, DOE. Home pulse ox at rest reading was 86%. No home O2 at baseline. Patients wife convinced him to come in for the oxygen level.  No fever, no chills.  Cough productive of yellow sputum.  Most likely has acute on chronic diastolic heart failure secondary to having his Lasix doctor in his previous admission and possible amiodarone toxicity. His amiodarone has been discontinued and he has started to diuresis. His course has been complicated by some delirium.     Assessment & Plan:   Principal Problem:   Hypoxia Active Problems:   Hypertension   S/P aortic valve replacement with bioprosthetic valve   Acute CHF (congestive heart failure) (HCC)   Pleural effusion   Hallucinations   Lymphadenopathy   Ascending aortic aneurysm (HCC)   Acute respiratory failure with hypoxia secondary to probable acute on chronic diastolic heart failure versus Amiodarone toxicity.  His Lasix was stopped and he was started on amiodarone after his valve replacement during his previous hospitalization.  -  Procalcitonin  < 0.1 x 2 -  US guided thoracentesis:  1L of blood fluid removed -  CT chest without contrast:  Pulmonary edema with possible consolidation vs. Atelectasis  -  Wt trending down -  -1.45L uop AND 1L of fluid removed with thoracentesis -  Place on oral lasix regimen. -  Continue BB and ACEI -  ECHO:  EF preserved.  Probable diastolic dysfunction with blunted IVC.  Aortic valve with mild to moderate regurgitation.   -  ESR 60 -  Amiodarone  discontinued on 09/04/2015   Bioprosthetic aortic valve replacement by Dr. Prescott Gum on 08/22/2015.    Chronic atrial fibrillation s/p MAZE procedure and atrial appendage clipping on 08/22/2015.  S/p PPM placement.  CHADs2vasc = 3 (hypertension, diabetes, and age).   -  Pradaxa resumed, continue through 09/18/2015  Ascending aortic aneurysm 4.3cm -  Repeat CT chest 08/2016  Possible gastric mass and lymphadenopathy. Most likely, this reflects food in stomach and edema -  Upper GI negative for mass -  Repeat CT chest in 3 months -  LDH minimally elevated, doubt malignancy  Hallucinations, may be due to stroke, either hemorrhagic or ischemic given a-fib and anticoagulation.  Patient cannot get MRI due to pacemaker.  May also have some delirium -  CT head:  No acute disease -  Monitor for infections:  Pneumonia unlikely given low procalcitonin -  UA negative for UTI  Elevated troponins are only mildly elevated and are trending down.  Likely due to MAZE procedure, recent valve replacement, heart failure in setting of CKD. 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 -  ECG:  Ventricularly paced rhythm. Mild flattening of the T waves in V3 through V5. T-wave inversion in V6 and deeper T waves in the lateral leads compared to prior.  Essential hypertension, blood pressures low normal   Borderline diabetes mellitus, CBG improved -  Continue SSI  CKD stage 3, creatinine near baseline of 1.5 -  Minimize nephrotoxins and renally dose medications  Hypothyroidism, TSH 0.441 wnl, continue synthroid  Leukocytosis, no obvious source of infection.  Had solumedrol administered in ER. -  Doubt pneumonia, as procalcitonin repeated low -  UA and UCx neg -  Pleural fluid culture neg -  F/u AM CBC  Normocytic anemia, likely due to chronic disease, hemoglobin stable around 54m/dl -  TSH wnl -  Occult stool   DVT prophylaxis:  pradaxa Code Status:  full Family Communication:   Patient  Disposition Plan:  Most likely next a.m.   Consultants:   Cardiothoracic surgery, Dr. VPrescott Gum Procedures:  CT chest  UKoreaguided thoracentesis CT head  ECHO  Antimicrobials:   Vancomycin 5/6 > 5/8   Zosyn 5/6  Cefepime 5/6 > 5/8   Subjective:  Continues has no new complaints.   Objective: Filed Vitals:   09/06/15 2144 09/07/15 0321 09/07/15 0724 09/07/15 1033  BP: 115/46 115/47  141/44  Pulse: 69 70  69  Temp: 98.9 F (37.2 C) 98 F (36.7 C)    TempSrc: Oral Oral    Resp: 18 18    Height:      Weight:  92.126 kg (203 lb 1.6 oz)    SpO2: 97% 96% 93%    No intake or output data in the 24 hours ending 09/07/15 1822 Filed Weights   09/05/15 0421 09/06/15 0453 09/07/15 0321  Weight: 94.5 kg (208 lb 5.4 oz) 93 kg (205 lb 0.4 oz) 92.126 kg (203 lb 1.6 oz)    Examination:  General exam:  Adult male.  No acute distress.  Appears calm and comfortable, sitting in chair, Awake alert HEENT:  NCAT, MMM Respiratory system:  Diminished at the right base, no rales,no wheezes, no rhonchi .  Respiratory effort normal.  Cardiovascular system: RRR. 2/6 systolic murmur at the right upper sternal border.  Warm extremities Gastrointestinal system: Abdomen is distended, soft and nontender. Normal bowel sounds. Skin: No rashes, lesions or ulcers MSK:  Normal tone and bulk, 1+ pitting bilateral lower extremity edema Neuro:  Answers questions appropriately, moves extremities equal,  Psychiatry: Mood & affect appropriate.  Oriented x 3.     Data Reviewed: I have personally reviewed following labs and imaging studies  CBC:  Recent Labs Lab 09/02/15 2005 09/04/15 0754 09/05/15 0627 09/06/15 0400 09/07/15 0438  WBC 18.0* 25.7* 17.0* 14.0* 14.6*  NEUTROABS 15.4*  --   --   --   --   HGB 9.1* 8.7* 9.1* 9.2* 9.7*  HCT 29.0* 27.6* 28.0* 29.3* 29.9*  MCV 85.3 84.4 88.3 85.2 87.7  PLT 361 430* 417* 344 4226   Basic Metabolic Panel:  Recent Labs Lab 09/03/15 0550  09/04/15 0754 09/05/15 0627 09/06/15 0400 09/07/15 0438  NA 135 138 139 138 139  K 3.7 4.1 3.9 4.1 4.3  CL 96* 100* 97* 98* 93*  CO2 _0 34*  GLUCOSE 171* 118* 106* 103* 113*  BUN 25* 33* 31* 26* 21*  CREATININE 1.58* 1.64* 1.46* 1.30* 1.43*  CALCIUM 8.6* 8.5* 8.3* 8.1* 9.0   GFR: Estimated Creatinine Clearance: 58.9 mL/min (by C-G formula based on Cr of 1.43). Liver Function Tests:  Recent Labs Lab 09/02/15 2005  AST 30  ALT 28  ALKPHOS 108  BILITOT 1.3*  PROT 6.5  ALBUMIN 2.8*   No results for input(s): LIPASE, AMYLASE in the last 168 hours. No results for input(s): AMMONIA in the last 168 hours. Coagulation Profile:  Recent Labs Lab 09/02/15 2011  INR 2.08*   Cardiac Enzymes:  Recent Labs Lab 09/04/15 1258  09/04/15 1845 09/05/15 0056 09/05/15 0627 09/05/15 1238  TROPONINI 0.14* 0.15* 0.15* 0.15* 0.14*   BNP (last 3 results) No results for input(s): PROBNP in the last 8760 hours. HbA1C: No results for input(s): HGBA1C in the last 72 hours. CBG:  Recent Labs Lab 09/06/15 2142 09/07/15 0626 09/07/15 1138 09/07/15 1609 09/07/15 1714  GLUCAP 119* 99 91 97 90   Lipid Profile: No results for input(s): CHOL, HDL, LDLCALC, TRIG, CHOLHDL, LDLDIRECT in the last 72 hours. Thyroid Function Tests: No results for input(s): TSH, T4TOTAL, FREET4, T3FREE, THYROIDAB in the last 72 hours. Anemia Panel: No results for input(s): VITAMINB12, FOLATE, FERRITIN, TIBC, IRON, RETICCTPCT in the last 72 hours. Urine analysis:    Component Value Date/Time   COLORURINE YELLOW 09/03/2015 2333   APPEARANCEUR CLEAR 09/03/2015 2333   LABSPEC 1.015 09/03/2015 2333   PHURINE 5.5 09/03/2015 2333   GLUCOSEU NEGATIVE 09/03/2015 2333   HGBUR NEGATIVE 09/03/2015 2333   BILIRUBINUR NEGATIVE 09/03/2015 2333   KETONESUR NEGATIVE 09/03/2015 2333   PROTEINUR NEGATIVE 09/03/2015 2333   NITRITE NEGATIVE 09/03/2015 2333   LEUKOCYTESUR NEGATIVE 09/03/2015 2333   Sepsis  Labs: _0 (procalcitonin:4,lacticidven:4)  ) Recent Results (from the past 240 hour(s))  Blood culture (routine x 2)     Status: None   Collection Time: 09/02/15  9:16 PM  Result Value Ref Range Status   Specimen Description BLOOD RIGHT ANTECUBITAL  Final   Special Requests IN PEDIATRIC BOTTLE 3CC  Final   Culture NO GROWTH 5 DAYS  Final   Report Status 09/07/2015 FINAL  Final  Blood culture (routine x 2)     Status: None   Collection Time: 09/02/15  9:20 PM  Result Value Ref Range Status   Specimen Description BLOOD LEFT ANTECUBITAL  Final   Special Requests BOTTLES DRAWN AEROBIC ONLY 8CC  Final   Culture NO GROWTH 5 DAYS  Final   Report Status 09/07/2015 FINAL  Final  MRSA PCR Screening     Status: None   Collection Time: 09/03/15 12:16 AM  Result Value Ref Range Status   MRSA by PCR NEGATIVE NEGATIVE Final    Comment:        The GeneXpert MRSA Assay (FDA approved for NASAL specimens only), is one component of a comprehensive MRSA colonization surveillance program. It is not intended to diagnose MRSA infection nor to guide or monitor treatment for MRSA infections.   Culture, Urine     Status: None   Collection Time: 09/03/15 11:33 PM  Result Value Ref Range Status   Specimen Description URINE, CLEAN CATCH  Final   Special Requests NONE  Final   Culture NO GROWTH 2 DAYS  Final   Report Status 09/05/2015 FINAL  Final  Culture, body fluid-bottle     Status: None (Preliminary result)   Collection Time: 09/04/15 10:24 AM  Result Value Ref Range Status   Specimen Description FLUID RIGHT PLEURAL  Final   Special Requests BOTTLES DRAWN AEROBIC AND ANAEROBIC 10CC  Final   Culture NO GROWTH 3 DAYS  Final   Report Status PENDING  Incomplete  Gram stain     Status: None   Collection Time: 09/04/15 10:24 AM  Result Value Ref Range Status   Specimen Description FLUID RIGHT PLEURAL  Final   Special Requests NONE  Final   Gram Stain   Final    RARE WBC PRESENT,  PREDOMINANTLY PMN NO ORGANISMS SEEN    Report Status 09/04/2015 FINAL  Final      Radiology  Studies: No results found.   Scheduled Meds: . amLODipine  10 mg Oral Daily  . aspirin EC  81 mg Oral Daily  . atorvastatin  40 mg Oral q1800  . budesonide  0.5 mg Nebulization BID  . dabigatran  150 mg Oral Q12H  . furosemide  40 mg Oral BID  . gabapentin  600 mg Oral QHS  . insulin aspart  0-5 Units Subcutaneous QHS  . insulin aspart  0-9 Units Subcutaneous TID WC  . irbesartan  150 mg Oral Daily  . levothyroxine  175 mcg Oral QAC breakfast  . loratadine  10 mg Oral Daily  . metoprolol tartrate  25 mg Oral BID  . pantoprazole  40 mg Oral Daily  . potassium chloride  20 mEq Oral Daily  . rOPINIRole  1 mg Oral Daily  . spironolactone  12.5 mg Oral Daily   Continuous Infusions:    LOS: 5 days    Time spent: 35 min    Velvet Bathe, MD [facility] Pager [phone]  If 7PM-7AM, please contact night-coverage [email or web address] Password [facility] 09/07/2015, 6:22 PM

## 2015-09-07 NOTE — Progress Notes (Signed)
CARDIAC REHAB PHASE I   Pt declines walking, wants to wait for his wife. Gave pt HF booklet and discussed. Gave reminders of previous walking gl for home and CRPII will call him. Pt needs reiteration. Apparently wife helps him usually. Encouraged him to read information with his wife. (I spoke to her yesterday). [id number]  [place], ACSM 09/07/2015 12:01 PM

## 2015-09-08 DIAGNOSIS — J9601 Acute respiratory failure with hypoxia: Secondary | ICD-10-CM

## 2015-09-08 DIAGNOSIS — J9621 Acute and chronic respiratory failure with hypoxia: Secondary | ICD-10-CM

## 2015-09-08 LAB — CBC
HCT: 30.8 % — ABNORMAL LOW (ref 39.0–52.0)
Hemoglobin: 9.7 g/dL — ABNORMAL LOW (ref 13.0–17.0)
MCH: 27.9 pg (ref 26.0–34.0)
MCHC: 31.5 g/dL (ref 30.0–36.0)
MCV: 88.5 fL (ref 78.0–100.0)
Platelets: 422 10*3/uL — ABNORMAL HIGH (ref 150–400)
RBC: 3.48 MIL/uL — ABNORMAL LOW (ref 4.22–5.81)
RDW: 16.2 % — ABNORMAL HIGH (ref 11.5–15.5)
WBC: 14.7 10*3/uL — ABNORMAL HIGH (ref 4.0–10.5)

## 2015-09-08 LAB — GLUCOSE, CAPILLARY
Glucose-Capillary: 110 mg/dL — ABNORMAL HIGH (ref 65–99)
Glucose-Capillary: 124 mg/dL — ABNORMAL HIGH (ref 65–99)
Glucose-Capillary: 87 mg/dL (ref 65–99)
Glucose-Capillary: 148 mg/dL — ABNORMAL HIGH (ref 65–99)

## 2015-09-08 LAB — BASIC METABOLIC PANEL
Anion gap: 12 (ref 5–15)
BUN: 17 mg/dL (ref 6–20)
CO2: 33 mmol/L — ABNORMAL HIGH (ref 22–32)
Calcium: 8.9 mg/dL (ref 8.9–10.3)
Chloride: 93 mmol/L — ABNORMAL LOW (ref 101–111)
Creatinine, Ser: 1.5 mg/dL — ABNORMAL HIGH (ref 0.61–1.24)
GFR calc Af Amer: 54 mL/min — ABNORMAL LOW (ref >60)
GFR calc non Af Amer: 47 mL/min — ABNORMAL LOW (ref >60)
Glucose, Bld: 102 mg/dL — ABNORMAL HIGH (ref 65–99)
Potassium: 4.3 mmol/L (ref 3.5–5.1)
Sodium: 138 mmol/L (ref 135–145)

## 2015-09-08 MED ORDER — IPRATROPIUM BROMIDE 0.02 % IN SOLN
0.5000 mg | Freq: Four times a day (QID) | RESPIRATORY_TRACT | Status: DC
  Administered 2015-09-08 – 2015-09-09 (×4): 0.5 mg via RESPIRATORY_TRACT
  Filled 2015-09-08 (×5): qty 2.5

## 2015-09-08 MED ORDER — BUDESONIDE 0.5 MG/2ML IN SUSP
0.2500 mg | Freq: Two times a day (BID) | RESPIRATORY_TRACT | Status: DC
  Administered 2015-09-08 – 2015-09-09 (×2): 0.25 mg via RESPIRATORY_TRACT
  Filled 2015-09-08 (×4): qty 2

## 2015-09-08 NOTE — Consult Note (Addendum)
PULMONARY / CRITICAL CARE MEDICINE   Name: [name] MRN: [id number] DOB: 12-Jun-1950    ADMISSION DATE:  09/02/2015 CONSULTATION DATE:  09/08/15   REFERRING MD:  Dr Nils Pyle  CHIEF COMPLAINT:  dyspnea  HISTORY OF PRESENT ILLNESS:   65 year old male. Followed by Dr Lamonte Sakai for "asthma": - last seen sumemr 2016 and symbicort held due to possible upper airway irritation. Prior to that in 2015 CPST showed multifactorial likely circulatory limitation for dyspnea. On 08/21/2015 his pulmonary function test showed a mixed obstructive restrictive pattern with an FVC of 2.25 L/49%, FEV1 1.6 L/47% and a ratio of 71. Total lung capacity 5.9 L/84% and DLCO 17.7/54%.   He was admitted 08/22/18`7 by [place] valve replacement associated with maze procedure and discharged on 08/28/2015. However he was readmitted 09/02/2015 with worsening shortness of breath and hypoxemia. Noticed to have a right pleural effusion that was bloody on thoracentesis. Repeat echo showed normal ejection fraction. He has been diuresed. He is beginning to feel better and close to baseline. He still has some occasional wheezing for which Pulmicort nebulizers just been started today that his planned discharge for tomorrow 09/09/2015 but he wanted to see pulmonary before he left. He endorses overall feeling well and in good spirits.   PAST MEDICAL HISTORY :  He  has a past medical history of Hypertension; Hyperlipidemia; Fibromyalgia; Obesity; Mitral regurgitation; Aortic stenosis, mild; Left atrial enlargement; Pacemaker (03/31/2012); Heart murmur; Asthma; Exertional dyspnea; Hypothyroidism; DJD (degenerative joint disease); Arthritis of hand; Atrial fibrillation ([place]); First degree AV block; Second degree AV block; GERD (gastroesophageal reflux disease); Restless leg syndrome; Obstructive sleep apnea; and Diabetes mellitus without complication ([place]).  PAST SURGICAL HISTORY: He  has past surgical history that includes Finger tendon  repair [id number]); US ECHOCARDIOGRAPHY (08/07/2009); doppler echocardiography (03/11/2002); Cardiovascular stress test (03/12/2002); TEE without cardioversion (01/30/2012); Pacemaker insertion (03/31/2012); Atrial fibrillation ablation (01/30/2012); Lumbar disc surgery (1980's X2;  2000's); Cardioversion (01/2012; 03/31/2012); atrial fibrillation ablation (N/A, 01/31/2012); Cardioversion (N/A, 02/25/2012); permanent pacemaker insertion (N/A, 03/31/2012); Cardioversion (N/A, 03/31/2012); left and right heart catheterization with coronary angiogram (N/A, 10/27/2012); Cardiac catheterization (N/A, 08/09/2015); Back surgery; Finger surgery (Left); Insert / replace / remove pacemaker; Aortic valve replacement (N/A, 08/22/2015); MAZE (N/A, 08/22/2015); Clipping of atrial appendage (N/A, 08/22/2015); and TEE without cardioversion (N/A, 08/22/2015).  Allergies  Allergen Reactions  . Meloxicam Rash  . 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 syndrome 09/02/15, resolved with diphenhydramine and slowing of rate    No current facility-administered medications on file prior to encounter.   Current Outpatient Prescriptions on File Prior to Encounter  Medication Sig  . albuterol (PROAIR HFA) 108 (90 BASE) MCG/ACT inhaler Inhale 2 puffs into the lungs every 6 (six) hours as needed for wheezing or shortness of breath.  [name] amiodarone (PACERONE) 200 MG tablet Take 1 tablet (200 mg total) by mouth 2 (two) times daily.  [name] amLODipine (NORVASC) 10 MG tablet Take 1 tablet (10 mg total) by mouth daily.  [name] aspirin 81 MG EC tablet Take 1 tablet (81 mg total) by mouth daily. Swallow whole.  [name] atorvastatin (LIPITOR) 40 MG tablet Take 1 tablet (40 mg total) by mouth daily.  . Cholecalciferol (VITAMIN D) 2000 UNITS CAPS Take 2,000 Units by mouth daily.  . dabigatran (PRADAXA) 150 MG CAPS capsule Take 1 capsule (150 mg total) by mouth 2 (two) times daily.  . fexofenadine (ALLEGRA) 180 MG tablet Take 1 tablet (180 mg total) by mouth daily.  [name]  fluticasone (FLONASE) 50 MCG/ACT nasal spray Place 2 sprays into both nostrils 2 (two) times daily. (Patient taking differently: Place 2 sprays into both nostrils 2 (two) times daily as needed for allergies. )  . gabapentin (NEURONTIN) 300 MG capsule Take 600 capsules by mouth at bedtime.   [name] levothyroxine (SYNTHROID, LEVOTHROID) 175 MCG tablet Take 175 mcg by mouth daily before breakfast.  . Melatonin-Pyridoxine (MELATONEX) 3-10 MG TBCR Take 1 tablet by mouth at bedtime.  . metoprolol tartrate (LOPRESSOR) 25 MG tablet Take 1 tablet (25 mg total) by mouth 2 (two) times daily.  . ondansetron (ZOFRAN ODT) 4 MG disintegrating tablet Take 1 tablet (4 mg total) by mouth every 8 (eight) hours as needed for nausea or vomiting.  [name] oxyCODONE (OXY IR/ROXICODONE) 5 MG immediate release tablet Take 1-2 tablets (5-10 mg total) by mouth every 3 (three) hours as needed for severe pain.  . pantoprazole (PROTONIX) 40 MG tablet Take 40 mg by mouth daily.  [name] rOPINIRole (REQUIP) 1 MG tablet Take 1 tablet (1 mg total) by mouth daily.  [name] spironolactone (ALDACTONE) 25 MG tablet Take 0.5 tablets (12.5 mg total) by mouth daily.  . traMADol (ULTRAM) 50 MG tablet Take 1 tablet by mouth daily.   . valACYclovir (VALTREX) 1000 MG tablet Take 1 tablet by mouth every 12 (twelve) hours as needed (fever blisters).   . valsartan (DIOVAN) 160 MG tablet Take 160 mg by mouth daily.    FAMILY HISTORY:  His indicated that his mother is deceased. He indicated that his father is deceased.   SOCIAL HISTORY: He  reports that he quit smoking about 24 years ago. His smoking use included Cigarettes. He has a 7.5 pack-year smoking history. He has never used smokeless tobacco. He reports that he drinks alcohol. He reports that he uses illicit drugs (Marijuana).  REVIEW OF SYSTEMS:   11 point ROS as per hpi. Otherwise negative  VITAL SIGNS: BP 119/50 mmHg  Pulse 69  Temp(Src) 98 F (36.7 C) (Oral)  Resp 18  Ht 5\' 11"  (1.803 m)  Wt  89.767 kg (197 lb 14.4 oz)  BMI 27.61 kg/m2  SpO2 95%  HEMODYNAMICS:    VENTILATOR SETTINGS:    INTAKE / OUTPUT: I/O last 3 completed shifts: In: -  Out: 200 [Urine:200]  PHYSICAL EXAMINATION: BP 119/50 mmHg  Pulse 69  Temp(Src) 98 F (36.7 C) (Oral)  Resp 18  Ht 5\' 11"  (1.803 m)  Wt 89.767 kg (197 lb 14.4 oz)  BMI 27.61 kg/m2  SpO2 95%  General Appearance:    Alert, cooperative, no distress, appears stated age  Head:    Normocephalic, without obvious abnormality, atraumatic  Eyes:    PERRL, conjunctiva/corneas clear, EOM's intact, fundi    benign, both eyes       Ears:    Normal TM's and external ear canals, both ears  Nose:   Nares normal, septum midline, mucosa normal, no drainage   or sinus tenderness  Throat:   Lips, mucosa, and tongue normal; teeth and gums normal  Neck:   Supple, symmetrical, trachea midline, no adenopathy;       thyroid:  No enlargement/tenderness/nodules; no carotid   bruit or JVD  Back:     Symmetric, no curvature, ROM normal, no CVA tenderness  Lungs:     Clear to auscultation bilaterally, respirations unlabored but there are some occasional scattered wheeze   Chest wall:    No tenderness or deformity - Recent scar surgery present.   Heart:  Regular rate and rhythm, S1 and S2 normal, no murmur, rub   or gallop  Abdomen:     Soft, non-tender, bowel sounds active all four quadrants,    no masses, no organomegaly  Genitalia:  Not examined   Rectal:  Not examined   Extremities:   Extremities normal, atraumatic, no cyanosis or edema  Pulses:   2+ and symmetric all extremities  Skin:   Skin color, texture, turgor normal, no rashes or lesions  Lymph nodes:   Cervical, supraclavicular, and axillary nodes normal  Neurologic:   CNII-XII intact. Normal strength, sensation and reflexes      throughout     LABS:  PULMONARY No results for input(s): PHART, PCO2ART, PO2ART, HCO3, TCO2, O2SAT in the last 168 hours.  Invalid input(s): PCO2,  PO2  CBC  Recent Labs Lab 09/06/15 0400 09/07/15 0438 09/08/15 0451  HGB 9.2* 9.7* 9.7*  HCT 29.3* 29.9* 30.8*  WBC 14.0* 14.6* 14.7*  PLT 344 453* 422*    COAGULATION  Recent Labs Lab 09/02/15 2011  INR 2.08*    CARDIAC   Recent Labs Lab 09/04/15 1258 09/04/15 1845 09/05/15 0056 09/05/15 0627 09/05/15 1238  TROPONINI 0.14* 0.15* 0.15* 0.15* 0.14*   No results for input(s): PROBNP in the last 168 hours.   CHEMISTRY  Recent Labs Lab 09/04/15 0754 09/05/15 0627 09/06/15 0400 09/07/15 0438 09/08/15 0451  NA 138 139 138 139 138  K 4.1 3.9 4.1 4.3 4.3  CL 100* 97* 98* 93* 93*  CO2 28 30 26  34* 33*  GLUCOSE 118* 106* 103* 113* 102*  BUN 33* 31* 26* 21* 17  CREATININE 1.64* 1.46* 1.30* 1.43* 1.50*  CALCIUM 8.5* 8.3* 8.1* 9.0 8.9   Estimated Creatinine Clearance: 51.6 mL/min (by C-G formula based on Cr of 1.5).   LIVER  Recent Labs Lab 09/02/15 2005 09/02/15 2011  AST 30  --   ALT 28  --   ALKPHOS 108  --   BILITOT 1.3*  --   PROT 6.5  --   ALBUMIN 2.8*  --   INR  --  2.08*     INFECTIOUS  Recent Labs Lab 09/03/15 1051 09/05/15 0627 09/07/15 0438  PROCALCITON <0.10 <0.10 <0.10     ENDOCRINE CBG (last 3)   Recent Labs  09/07/15 2119 09/08/15 0609 09/08/15 1117  GLUCAP 112* 110* 124*         IMAGING x48h  - image(s) personally visualized  -   highlighted in bold No results found.    ASSESSMENT / PLAN:  PULMONARY A: #Baseline history of mild persistent/moderate persistent asthma - unclear if even to diagnosis.  #Currently acute hypoxemic respiratory failure due to right pleural effusion fine cardiac surgery and diastolic dysfunction requiring diuresis. Significant improvement but still some ongoing wheeze as of 09/08/2015. Most likely wheezing his cardiac pulmonary edema acute diastolic dysfunction related  P:   Agree with Pulmicort nebulizer 2 times a day for discharge  Will add Atrovent nebulizer 4 times  daily We'll do Xopenex nebulizer when necessary [given atrial fibrillation issues we'll try to avoid albuterol until cleared by cardiac surgery] Do exertional desaturation test Depending on improvement with the above can consider short course by mouth prednisone but will try to hold off on that to the extent possible Outpatient follow-up with Dr. Lamonte Sakai required Avoid ACE inhibitor    [facility] M will review on the chart 09/09/2015 and decide on bedside rounds after that. Call if necessary   Dr. Brand Males, M.D.,  F.C.C.P Pulmonary and Critical Care Medicine Staff Physician [facility] Pager: [phone], If no answer or between  15:00h - 7:00h: call [phone]  09/08/2015 12:03 PM

## 2015-09-08 NOTE — Progress Notes (Signed)
09/08/2015 1:07 PM Pt walked 550 ft on RA.  Sats before walking are 97% and HR 70.  During walk sats got as low as 94% and as high as 99%.  HR stayed 69-70. [place]

## 2015-09-08 NOTE — Progress Notes (Signed)
[place] to see pt to walk. Pt stated he is too comfortable and will walk with wife. Offered several times but pt prefers to wait for her. Stated he would get his three walks in today. Graylon Good RN BSN 09/08/2015 11:43 AM

## 2015-09-08 NOTE — Progress Notes (Signed)
  Subjective:  Readmission after AVR, Maze with R pleural bloody effusion, fluid retention. Hx asthma, mixed obstructive and restrictive COPD- [place] WBC now better Sat 97 % RA Wheezing better  Slept better, breathing better of O2 Paced rhythm Will recheck CXR tomorrow re: R effusion  Objective: Vital signs in last 24 hours: Temp:  [98 F (36.7 C)-98.2 F (36.8 C)] 98 F (36.7 C) (05/12 0527) Pulse Rate:  [69-70] 69 (05/12 1046) Cardiac Rhythm:  [-] Ventricular paced (05/12 0815) Resp:  [18] 18 (05/12 0527) BP: (119-135)/(50-59) 119/50 mmHg (05/12 1046) SpO2:  [95 %-97 %] 95 % (05/12 0527) Weight:  [197 lb 14.4 oz (89.767 kg)] 197 lb 14.4 oz (89.767 kg) (05/12 0527)  Hemodynamic parameters for last 24 hours:  stable  Intake/Output from previous day: 05/11 0701 - 05/12 0700 In: -  Out: 200 [Urine:200] Intake/Output this shift:         Exam    General- alert and comfortable   Lungs- clear without rales, wheezes   Cor- regular rate and rhythm, no murmur , gallop   Abdomen- soft, non-tender   Extremities - warm, non-tender, minimal edema   Neuro- oriented, appropriate, no focal weakness   Lab Results:  Recent Labs  09/07/15 0438 09/08/15 0451  WBC 14.6* 14.7*  HGB 9.7* 9.7*  HCT 29.9* 30.8*  PLT 453* 422*   BMET:  Recent Labs  09/07/15 0438 09/08/15 0451  NA 139 138  K 4.3 4.3  CL 93* 93*  CO2 34* 33*  GLUCOSE 113* 102*  BUN 21* 17  CREATININE 1.43* 1.50*  CALCIUM 9.0 8.9    PT/INR: No results for input(s): LABPROT, INR in the last 72 hours. ABG    Component Value Date/Time   PHART 7.315* 08/23/2015 0023   HCO3 21.0 08/23/2015 0023   TCO2 26 08/23/2015 1659   ACIDBASEDEF 5.0* 08/23/2015 0023   O2SAT 94.0 08/23/2015 0023   CBG (last 3)   Recent Labs  09/07/15 1714 09/07/15 2119 09/08/15 0609  GLUCAP 90 112* 110*    Assessment/Plan: S/P  AVR, Maze Will see patient back for surgical followup later this month DC home on po lasix  40 mg daily Home nebulizer ordered for pulmicort thru case manager Pulmonary consult called at request of patient s wife  LOS: 6 days    Tharon Aquas Trigt III 09/08/2015

## 2015-09-08 NOTE — Care Management Note (Signed)
Case Management Note Marvetta Gibbons RN, BSN Unit 2W-Case Manager [phone]  Patient Details  Name: [name] MRN: [id number] Date of Birth: [birth year]  Subjective/Objective:    Pt admitted with hypoxia , recent surgery                Action/Plan: PTA pt lived at home with spouse- plan to return home with wife- will need RW for home DME order has been placed- also would like nebulizer for home referral has been received for home nebulizer by Dr. Darcey Nora. Have spoken with [facility] with [facility] regarding DME needs for discharge.   Expected Discharge Date:  09/10/15               Expected Discharge Plan:  Home/Self Care  In-House Referral:     Discharge planning Services  CM Consult  Post Acute Care Choice:  Durable Medical Equipment Choice offered to:  Patient  DME Arranged:  Nebulizer machine, Walker rolling DME Agency:  [place]:    [place]:     Status of Service:  Completed, signed off  Medicare Important Message Given:  Yes Date Medicare IM Given:    Medicare IM give by:    Date Additional Medicare IM Given:    Additional Medicare Important Message give by:     If discussed at [place] of Stay Meetings, dates discussed:    Additional Comments:  Dawayne Patricia, RN 09/08/2015, 1:31 PM

## 2015-09-08 NOTE — Progress Notes (Signed)
PROGRESS NOTE  [name]  NGE:[id number] DOB: 09/16/1950 DOA: 09/02/2015 PCP: Velna Hatchet, MD Outpatient Specialists:  Dr. Prescott Gum cardiothoracic Brief Narrative:   [name] is a 65 y.o. male with medical history significant of HTN, HLD, OSA, ventricular pacemaker, aortic stenosis s/p bioprosthetic valve replacement on 4/25, just discharged from hospital on 5/1.  Patient presented to the ED with worsening BLE edema, orthopnea, SOB, DOE. Home pulse ox at rest reading was 86%. No home O2 at baseline. Patients wife convinced him to come in for the oxygen level.  No fever, no chills.  Cough productive of yellow sputum.  Most likely has acute on chronic diastolic heart failure secondary to having his Lasix doctor in his previous admission and possible amiodarone toxicity. His amiodarone has been discontinued and he has started to diuresis. His course has been complicated by some delirium.     Assessment & Plan:   Principal Problem:   Hypoxia Active Problems:   Hypertension   S/P aortic valve replacement with bioprosthetic valve   Acute CHF (congestive heart failure) (HCC)   Pleural effusion   Hallucinations   Lymphadenopathy   Ascending aortic aneurysm (HCC)   Acute respiratory failure with hypoxia (HCC)   Acute respiratory failure with hypoxia secondary to probable acute on chronic diastolic heart failure versus Amiodarone toxicity.  His Lasix was stopped and he was started on amiodarone after his valve replacement during his previous hospitalization.  -  Procalcitonin  < 0.1 x 2 -  US guided thoracentesis:  1L of blood fluid removed -  CT chest without contrast:  Pulmonary edema with possible consolidation vs. Atelectasis  -  Wt trending down -  -1.45L uop AND 1L of fluid removed with thoracentesis -  Place on oral lasix regimen. -  Continue BB and ACEI -  ECHO:  EF preserved.  Probable diastolic dysfunction with blunted IVC.  Aortic valve with mild to moderate  regurgitation.   -  ESR 60 -  Amiodarone discontinued on 09/04/2015  - Pulmonary consulted today for dyspnea  Bioprosthetic aortic valve replacement by Dr. Prescott Gum on 08/22/2015.    Chronic atrial fibrillation s/p MAZE procedure and atrial appendage clipping on 08/22/2015.  S/p PPM placement.  CHADs2vasc = 3 (hypertension, diabetes, and age).   -  Pradaxa resumed, continue through 09/18/2015  Ascending aortic aneurysm 4.3cm -  Repeat CT chest 08/2016  Possible gastric mass and lymphadenopathy. Most likely, this reflects food in stomach and edema -  Upper GI negative for mass -  Repeat CT chest in 3 months -  LDH minimally elevated, doubt malignancy  Hallucinations, may be due to stroke, either hemorrhagic or ischemic given a-fib and anticoagulation.  Patient cannot get MRI due to pacemaker.  May also have some delirium -  CT head:  No acute disease -  Monitor for infections:  Pneumonia unlikely given low procalcitonin -  UA negative for UTI - resolved  Elevated troponins are only mildly elevated and are trending down.  Likely due to MAZE procedure, recent valve replacement, heart failure in setting of CKD. 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 -  ECG:  Ventricularly paced rhythm. Mild flattening of the T waves in V3 through V5. T-wave inversion in V6 and deeper T waves in the lateral leads compared to prior.  Essential hypertension, blood pressures relatively well controlled  Borderline diabetes mellitus, CBG improved -  Continue SSI  CKD stage 3, creatinine near baseline of 1.5 -  Minimize nephrotoxins and renally dose medications  Hypothyroidism, TSH 0.441 wnl, continue synthroid  Leukocytosis, no obvious source of infection.  Had solumedrol administered in ER. -  Doubt pneumonia, as procalcitonin repeated low -  UA and UCx neg -  Pleural fluid culture neg -  F/u AM CBC  Normocytic anemia, likely due to chronic disease, hemoglobin stable around  54m/dl -  TSH wnl -  Occult stool   DVT prophylaxis:  pradaxa Code Status:  full Family Communication:  Patient  Disposition Plan:  Most likely next a.m.   Consultants:   Cardiothoracic surgery, Dr. VPrescott Gum Procedures:  CT chest  [place]ore[place]ided thoracentesis CT head  ECHO  Antimicrobials:   Vancomycin 5/6 > 5/8   Zosyn 5/6  Cefepime 5/6 > 5/8   Subjective:  No new complaints reported  Objective: Filed Vitals:   09/07/15 2033 09/07/15 2122 09/08/15 0527 09/08/15 1046  BP:  135/52 125/59 119/50  Pulse:  70 70 69  Temp:  98.2 F (36.8 C) 98 F (36.7 C)   TempSrc:  Oral Oral   Resp:  18 18   Height:      Weight:   89.767 kg (197 lb 14.4 oz)   SpO2: 95% 97% 95%     Intake/Output Summary (Last 24 hours) at 09/08/15 1319 Last data filed at 09/08/15 1236  Gross per 24 hour  Intake    360 ml  Output    200 ml  Net    160 ml   Filed Weights   09/06/15 0453 09/07/15 0321 09/08/15 0527  Weight: 93 kg (205 lb 0.4 oz) 92.126 kg (203 lb 1.6 oz) 89.767 kg (197 lb 14.4 oz)    Examination:  General exam:  Adult male.  No acute distress.  Appears calm and comfortable, sitting in chair, Awake alert HEENT:  NCAT, MMM Respiratory system:  Diminished at the right base, no rhonchi .  Respiratory effort normal.  Cardiovascular system: RRR. 2/6 systolic murmur at the right upper sternal border.  Warm extremities Gastrointestinal system: Abdomen is distended, soft and nontender. Normal bowel sounds. Skin: No rashes, lesions or ulcers MSK:  Normal tone and bulk, 1+ pitting bilateral lower extremity edema Neuro:  Answers questions appropriately, moves extremities equal,  Psychiatry: Mood & affect appropriate.  Oriented x 3.     Data Reviewed: I have personally reviewed following labs and imaging studies  CBC:  Recent Labs Lab 09/02/15 2005 09/04/15 0754 09/05/15 0627 09/06/15 0400 09/07/15 0438 09/08/15 0451  WBC 18.0* 25.7* 17.0* 14.0* 14.6* 14.7*  NEUTROABS  15.4*  --   --   --   --   --   HGB 9.1* 8.7* 9.1* 9.2* 9.7* 9.7*  HCT 29.0* 27.6* 28.0* 29.3* 29.9* 30.8*  MCV 85.3 84.4 88.3 85.2 87.7 88.5  PLT 361 430* 417* 344 453* 4017   Basic Metabolic Panel:  Recent Labs Lab 09/04/15 0754 09/05/15 0627 09/06/15 0400 09/07/15 0438 09/08/15 0451  NA 138 139 138 139 138  K 4.1 3.9 4.1 4.3 4.3  CL 100* 97* 98* 93* 93*  CO2 _0 34* 33*  GLUCOSE 118* 106* 103* 113* 102*  BUN 33* 31* 26* 21* 17  CREATININE 1.64* 1.46* 1.30* 1.43* 1.50*  CALCIUM 8.5* 8.3* 8.1* 9.0 8.9   GFR: Estimated Creatinine Clearance: 51.6 mL/min (by C-G formula based on Cr of 1.5). Liver Function Tests:  Recent Labs Lab 09/02/15 2005  AST 30  ALT 28  ALKPHOS 108  BILITOT 1.3*  PROT 6.5  ALBUMIN 2.8*   No results for input(s): LIPASE, AMYLASE in the last 168 hours. No results for input(s): AMMONIA in the last 168 hours. Coagulation Profile:  Recent Labs Lab 09/02/15 2011  INR 2.08*   Cardiac Enzymes:  Recent Labs Lab 09/04/15 1258 09/04/15 1845 09/05/15 0056 09/05/15 0627 09/05/15 1238  TROPONINI 0.14* 0.15* 0.15* 0.15* 0.14*   BNP (last 3 results) No results for input(s): PROBNP in the last 8760 hours. HbA1C: No results for input(s): HGBA1C in the last 72 hours. CBG:  Recent Labs Lab 09/07/15 1609 09/07/15 1714 09/07/15 2119 09/08/15 0609 09/08/15 1117  GLUCAP 97 90 112* 110* 124*   Lipid Profile: No results for input(s): CHOL, HDL, LDLCALC, TRIG, CHOLHDL, LDLDIRECT in the last 72 hours. Thyroid Function Tests: No results for input(s): TSH, T4TOTAL, FREET4, T3FREE, THYROIDAB in the last 72 hours. Anemia Panel: No results for input(s): VITAMINB12, FOLATE, FERRITIN, TIBC, IRON, RETICCTPCT in the last 72 hours. Urine analysis:    Component Value Date/Time   COLORURINE YELLOW 09/03/2015 2333   APPEARANCEUR CLEAR 09/03/2015 2333   LABSPEC 1.015 09/03/2015 2333   PHURINE 5.5 09/03/2015 2333   GLUCOSEU NEGATIVE 09/03/2015 2333    HGBUR NEGATIVE 09/03/2015 2333   BILIRUBINUR NEGATIVE 09/03/2015 2333   KETONESUR NEGATIVE 09/03/2015 2333   PROTEINUR NEGATIVE 09/03/2015 2333   NITRITE NEGATIVE 09/03/2015 2333   LEUKOCYTESUR NEGATIVE 09/03/2015 2333   Sepsis Labs: _0 (procalcitonin:4,lacticidven:4)  ) Recent Results (from the past 240 hour(s))  Blood culture (routine x 2)     Status: None   Collection Time: 09/02/15  9:16 PM  Result Value Ref Range Status   Specimen Description BLOOD RIGHT ANTECUBITAL  Final   Special Requests IN PEDIATRIC BOTTLE 3CC  Final   Culture NO GROWTH 5 DAYS  Final   Report Status 09/07/2015 FINAL  Final  Blood culture (routine x 2)     Status: None   Collection Time: 09/02/15  9:20 PM  Result Value Ref Range Status   Specimen Description BLOOD LEFT ANTECUBITAL  Final   Special Requests BOTTLES DRAWN AEROBIC ONLY 8CC  Final   Culture NO GROWTH 5 DAYS  Final   Report Status 09/07/2015 FINAL  Final  MRSA PCR Screening     Status: None   Collection Time: 09/03/15 12:16 AM  Result Value Ref Range Status   MRSA by PCR NEGATIVE NEGATIVE Final    Comment:        The GeneXpert MRSA Assay (FDA approved for NASAL specimens only), is one component of a comprehensive MRSA colonization surveillance program. It is not intended to diagnose MRSA infection nor to guide or monitor treatment for MRSA infections.   Culture, Urine     Status: None   Collection Time: 09/03/15 11:33 PM  Result Value Ref Range Status   Specimen Description URINE, CLEAN CATCH  Final   Special Requests NONE  Final   Culture NO GROWTH 2 DAYS  Final   Report Status 09/05/2015 FINAL  Final  Culture, body fluid-bottle     Status: None (Preliminary result)   Collection Time: 09/04/15 10:24 AM  Result Value Ref Range Status   Specimen Description FLUID RIGHT PLEURAL  Final   Special Requests BOTTLES DRAWN AEROBIC AND ANAEROBIC 10CC  Final   Culture NO GROWTH 3 DAYS  Final   Report Status PENDING   Incomplete  Gram stain     Status: None   Collection Time: 09/04/15 10:24 AM  Result Value Ref Range Status  Specimen Description FLUID RIGHT PLEURAL  Final   Special Requests NONE  Final   Gram Stain   Final    RARE WBC PRESENT, PREDOMINANTLY PMN NO ORGANISMS SEEN    Report Status 09/04/2015 FINAL  Final      Radiology Studies: No results found.   Scheduled Meds: . amLODipine  10 mg Oral Daily  . aspirin EC  81 mg Oral Daily  . atorvastatin  40 mg Oral q1800  . budesonide  0.25 mg Nebulization BID  . dabigatran  150 mg Oral Q12H  . furosemide  40 mg Oral BID  . gabapentin  600 mg Oral QHS  . insulin aspart  0-5 Units Subcutaneous QHS  . insulin aspart  0-9 Units Subcutaneous TID WC  . ipratropium  0.5 mg Nebulization QID  . irbesartan  150 mg Oral Daily  . levothyroxine  175 mcg Oral QAC breakfast  . loratadine  10 mg Oral Daily  . metoprolol tartrate  25 mg Oral BID  . pantoprazole  40 mg Oral Daily  . potassium chloride  20 mEq Oral Daily  . rOPINIRole  1 mg Oral Daily  . spironolactone  12.5 mg Oral Daily   Continuous Infusions:    LOS: 6 days    Time spent: 35 min    Velvet Bathe, MD [facility] Pager [phone]  If 7PM-7AM, please contact night-coverage [email or web address] Password [email or web address] 09/08/2015, 1:19 PM

## 2015-09-08 NOTE — Care Management Important Message (Signed)
Important Message  Patient Details  Name: [name] MRN: [id number] Date of Birth: 10/11/1950   Medicare Important Message Given:  Yes    [name], [name] 09/08/2015, 11:13 AM

## 2015-09-09 ENCOUNTER — Telehealth: Payer: Self-pay | Admitting: Internal Medicine

## 2015-09-09 ENCOUNTER — Inpatient Hospital Stay (HOSPITAL_COMMUNITY): Payer: Medicare Other

## 2015-09-09 DIAGNOSIS — R0902 Hypoxemia: Secondary | ICD-10-CM

## 2015-09-09 LAB — CULTURE, BODY FLUID W GRAM STAIN -BOTTLE: Culture: NO GROWTH

## 2015-09-09 LAB — BASIC METABOLIC PANEL
Anion gap: 11 (ref 5–15)
BUN: 18 mg/dL (ref 6–20)
CO2: 33 mmol/L — ABNORMAL HIGH (ref 22–32)
Calcium: 8.9 mg/dL (ref 8.9–10.3)
Chloride: 93 mmol/L — ABNORMAL LOW (ref 101–111)
Creatinine, Ser: 1.69 mg/dL — ABNORMAL HIGH (ref 0.61–1.24)
GFR calc Af Amer: 47 mL/min — ABNORMAL LOW (ref >60)
GFR calc non Af Amer: 41 mL/min — ABNORMAL LOW (ref >60)
Glucose, Bld: 103 mg/dL — ABNORMAL HIGH (ref 65–99)
Potassium: 4.8 mmol/L (ref 3.5–5.1)
Sodium: 137 mmol/L (ref 135–145)

## 2015-09-09 LAB — CBC
HCT: 30.5 % — ABNORMAL LOW (ref 39.0–52.0)
Hemoglobin: 9.7 g/dL — ABNORMAL LOW (ref 13.0–17.0)
MCH: 28.1 pg (ref 26.0–34.0)
MCHC: 31.8 g/dL (ref 30.0–36.0)
MCV: 88.4 fL (ref 78.0–100.0)
Platelets: 342 10*3/uL (ref 150–400)
RBC: 3.45 MIL/uL — ABNORMAL LOW (ref 4.22–5.81)
RDW: 15.9 % — ABNORMAL HIGH (ref 11.5–15.5)
WBC: 11 10*3/uL — ABNORMAL HIGH (ref 4.0–10.5)

## 2015-09-09 LAB — GLUCOSE, CAPILLARY
Glucose-Capillary: 91 mg/dL (ref 65–99)
Glucose-Capillary: 120 mg/dL — ABNORMAL HIGH (ref 65–99)

## 2015-09-09 MED ORDER — SPIRONOLACTONE 25 MG PO TABS: 12.5000 mg | ORAL_TABLET | Freq: Every day | ORAL | Status: DC

## 2015-09-09 MED ORDER — BUDESONIDE 0.25 MG/2ML IN SUSP
0.2500 mg | Freq: Two times a day (BID) | RESPIRATORY_TRACT | Status: DC
Start: 2015-09-09 — End: 2015-09-09

## 2015-09-09 MED ORDER — IPRATROPIUM BROMIDE 0.02 % IN SOLN: 0.5000 mg | Freq: Four times a day (QID) | RESPIRATORY_TRACT | Status: DC

## 2015-09-09 MED ORDER — BUDESONIDE 0.25 MG/2ML IN SUSP: 0.2500 mg | Freq: Two times a day (BID) | RESPIRATORY_TRACT | Status: DC

## 2015-09-09 MED ORDER — LEVALBUTEROL HCL 0.63 MG/3ML IN NEBU: 0.6300 mg | INHALATION_SOLUTION | Freq: Four times a day (QID) | RESPIRATORY_TRACT | Status: DC | PRN

## 2015-09-09 MED ORDER — FUROSEMIDE 40 MG PO TABS: 40.0000 mg | ORAL_TABLET | Freq: Two times a day (BID) | ORAL | Status: DC

## 2015-09-09 NOTE — Discharge Summary (Signed)
Physician Discharge Summary  [name] WFU:[id number] DOB: 06/23/1950 DOA: 09/02/2015  PCP: Velna Hatchet, MD  Admit date: 09/02/2015 Discharge date: 09/09/2015  Time spent: > 35 minutes  Recommendations for Outpatient Follow-up:  1. Monitor WBC levels 2. Monitor serum creatinine  3. Repeat CT chest in 3 months   Discharge Diagnoses:  Principal Problem:   Hypoxia Active Problems:   Hypertension   S/P aortic valve replacement with bioprosthetic valve   Acute CHF (congestive heart failure) (HCC)   Pleural effusion   Hallucinations   Lymphadenopathy   Ascending aortic aneurysm (HCC)   Acute respiratory failure with hypoxia [facility])   Discharge Condition: stable  Diet recommendation: Heart healthy  Filed Weights   09/07/15 0321 09/08/15 0527 09/09/15 0436  Weight: 92.126 kg (203 lb 1.6 oz) 89.767 kg (197 lb 14.4 oz) 90.4 kg (199 lb 4.7 oz)    History of present illness:  [name] is a 65 y.o. male with medical history significant of HTN, HLD, OSA, ventricular pacemaker, aortic stenosis s/p bioprosthetic valve replacement on 4/25, just discharged from hospital on 5/1. Patient presented to the ED with worsening BLE edema, orthopnea, SOB, DOE. Home pulse ox at rest reading was 86%. No home O2 at baseline. Patients wife convinced him to come in for the oxygen level. No fever, no chills. Cough productive of yellow sputum. Most likely has acute on chronic diastolic heart failure secondary to having his Lasix doctor in his previous admission and possible amiodarone toxicity. His amiodarone has been discontinued and he has started to diuresis. His course has been complicated by some delirium.  Hospital Course:   Acute respiratory failure with hypoxia secondary to probable acute on chronic diastolic heart failure versus Amiodarone toxicity. His Lasix was stopped and he was started on amiodarone after his valve replacement during his previous hospitalization.  -  Procalcitonin < 0.1 x 2 - US guided thoracentesis: 1L of blood fluid removed - CT chest without contrast: Pulmonary edema with possible consolidation vs. Atelectasis  - Wt trending down - -1.45L uop AND 1L of fluid removed with thoracentesis - Place on oral lasix regimen. - Continue BB and ACEI - ECHO: EF preserved. Probable diastolic dysfunction with blunted IVC. Aortic valve with mild to moderate regurgitation.  - ESR 60 - Amiodarone discontinued on 09/04/2015  - Pulmonary has evaluated and made their recommendations as such I have provided scripts for xopenex, atrovent, and pulmicort. Discontinued albuterol  Bioprosthetic aortic valve replacement by Dr. Prescott Gum on 08/22/2015.   Chronic atrial fibrillation s/p MAZE procedure and atrial appendage clipping on 08/22/2015. S/p PPM placement. CHADs2vasc = 3 (hypertension, diabetes, and age).  - Pradaxa resumed, continue through 09/18/2015  Ascending aortic aneurysm 4.3cm - Repeat CT chest 08/2016  Possible gastric mass and lymphadenopathy. Most likely, this reflects food in stomach and edema - Upper GI negative for mass - Repeat CT chest in 3 months - LDH minimally elevated, doubt malignancy  Hallucinations, may be due to stroke, either hemorrhagic or ischemic given a-fib and anticoagulation. Patient cannot get MRI due to pacemaker. May also have some delirium - CT head: No acute disease - Monitor for infections: Pneumonia unlikely given low procalcitonin - UA negative for UTI - resolved  Elevated troponins are only mildly elevated and are trending down. Likely due to MAZE procedure, recent valve replacement, heart failure in setting of CKD. 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 - ECG: Ventricularly paced rhythm. Mild flattening of the T waves  in V3 through V5. T-wave inversion in V6 and deeper T waves in the lateral leads compared to prior.  Essential hypertension,  blood pressures relatively well controlled  Borderline diabetes mellitus, CBG improved - Continue SSI  CKD stage 3, creatinine near baseline of 1.5 - Minimize nephrotoxins and renally dose medications  Hypothyroidism, TSH 0.441 wnl, continue synthroid  Leukocytosis, no obvious source of infection. Had solumedrol administered in ER. - Doubt pneumonia, as procalcitonin repeated low - UA and UCx neg - Pleural fluid culture neg - F/u AM CBC  Normocytic anemia, likely due to chronic disease, hemoglobin stable around 25m/dl - TSH wnl - Occult stool  Procedures:  Refer to CT surgeons notes  Consultations:  CT surgeon  Pulmonology  Discharge Exam: Filed Vitals:   09/09/15 0905 09/09/15 1115  BP:    Pulse: 70 69  Temp:    Resp: 18 18    General: Pt in nad, alert and awake Cardiovascular: IRRR, no rubs Respiratory: no increased wob, no wheezes, equal chest rise.  Discharge Instructions   Discharge Instructions    Call MD for:  redness, tenderness, or signs of infection (pain, swelling, redness, odor or green/yellow discharge around incision site)    Complete by:  As directed      Call MD for:  temperature >100.4    Complete by:  As directed      Diet - low sodium heart healthy    Complete by:  As directed      Discharge instructions    Complete by:  As directed   Please be sure to follow up with your primary care physician in 1-2 weeks or sooner should any new concerns arise.     Increase activity slowly    Complete by:  As directed           Current Discharge Medication List    START taking these medications   Details  budesonide (PULMICORT) 0.25 MG/2ML nebulizer solution Take 2 mLs (0.25 mg total) by nebulization 2 (two) times daily. Qty: 60 mL, Refills: 0    furosemide (LASIX) 40 MG tablet Take 1 tablet (40 mg total) by mouth 2 (two) times daily. Qty: 60 tablet, Refills: 0    ipratropium (ATROVENT) 0.02 % nebulizer solution Take 2.5 mLs (0.5 mg  total) by nebulization 4 (four) times daily. Qty: 75 mL, Refills: 12    levalbuterol (XOPENEX) 0.63 MG/3ML nebulizer solution Take 3 mLs (0.63 mg total) by nebulization every 6 (six) hours as needed for wheezing or shortness of breath. Qty: 3 mL, Refills: 0      CONTINUE these medications which have CHANGED   Details  spironolactone (ALDACTONE) 25 MG tablet Take 0.5 tablets (12.5 mg total) by mouth daily. Qty: 30 tablet, Refills: 0      CONTINUE these medications which have NOT CHANGED   Details  amLODipine (NORVASC) 10 MG tablet Take 1 tablet (10 mg total) by mouth daily. Qty: 30 tablet, Refills: 3    aspirin 81 MG EC tablet Take 1 tablet (81 mg total) by mouth daily. Swallow whole. Qty: 30 tablet, Refills: 12    atorvastatin (LIPITOR) 40 MG tablet Take 1 tablet (40 mg total) by mouth daily. Qty: 90 tablet, Refills: 1    Cholecalciferol (VITAMIN D) 2000 UNITS CAPS Take 2,000 Units by mouth daily.    dabigatran (PRADAXA) 150 MG CAPS capsule Take 1 capsule (150 mg total) by mouth 2 (two) times daily. Qty: 60 capsule, Refills: 1    fexofenadine (ALLEGRA)  180 MG tablet Take 1 tablet (180 mg total) by mouth daily. Qty: 30 tablet, Refills: 1    fluticasone (FLONASE) 50 MCG/ACT nasal spray Place 2 sprays into both nostrils 2 (two) times daily. Qty: 16 g, Refills: 2    gabapentin (NEURONTIN) 300 MG capsule Take 600 capsules by mouth at bedtime.  Refills: 3    levothyroxine (SYNTHROID, LEVOTHROID) 175 MCG tablet Take 175 mcg by mouth daily before breakfast.    Melatonin-Pyridoxine (MELATONEX) 3-10 MG TBCR Take 1 tablet by mouth at bedtime.    metoprolol tartrate (LOPRESSOR) 25 MG tablet Take 1 tablet (25 mg total) by mouth 2 (two) times daily. Qty: 60 tablet, Refills: 3    ondansetron (ZOFRAN ODT) 4 MG disintegrating tablet Take 1 tablet (4 mg total) by mouth every 8 (eight) hours as needed for nausea or vomiting. Qty: 10 tablet, Refills: 0    pantoprazole (PROTONIX) 40 MG  tablet Take 40 mg by mouth daily.    rOPINIRole (REQUIP) 1 MG tablet Take 1 tablet (1 mg total) by mouth daily. Qty: 90 tablet, Refills: 2    traMADol (ULTRAM) 50 MG tablet Take 1 tablet by mouth daily.     valACYclovir (VALTREX) 1000 MG tablet Take 1 tablet by mouth every 12 (twelve) hours as needed (fever blisters).     valsartan (DIOVAN) 160 MG tablet Take 160 mg by mouth daily.      STOP taking these medications     albuterol (PROAIR HFA) 108 (90 BASE) MCG/ACT inhaler      amiodarone (PACERONE) 200 MG tablet      oxyCODONE (OXY IR/ROXICODONE) 5 MG immediate release tablet        Allergies  Allergen Reactions  . Meloxicam Rash  . 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 syndrome 09/02/15, resolved with diphenhydramine and slowing of rate   Follow-up Information    Follow up with [facility].   Why:  Rolling walker and nebulizer machine have been arranged for home- RW to be deliered to room prior to discharge   Contact information:   [address] [place] [address] [phone]        The results of significant diagnostics from this hospitalization (including imaging, microbiology, ancillary and laboratory) are listed below for reference.    Significant Diagnostic Studies: Dg Chest 1 View  09/04/2015  CLINICAL DATA:  Status post right-sided thoracentesis EXAM: CHEST 1 VIEW COMPARISON:  Chest radiograph Sep 02, 2015 and chest CT Sep 03, 2015 FINDINGS: No demonstrable pneumothorax. Effusion on the right is smaller post thoracentesis. Small residual effusion remains on the right. There is patchy bibasilar atelectasis with minimal left pleural effusion. Lungs elsewhere clear. Heart is upper normal in size with pulmonary vascular within normal limits. Pacemaker leads are attached to the right ventricle and right atrium. Left atrial appendage clamp present. No bone lesions. IMPRESSION: No pneumothorax. Right effusion smaller after thoracentesis.  Minimal left effusion. Atelectasis remains in the bases, slightly more on the right than on the left. Stable cardiac silhouette. Electronically Signed   By: Lowella Grip III M.D.   On: 09/04/2015 10:52   Dg Chest 2 View  09/09/2015  CLINICAL DATA:  Shortness of breath. EXAM: CHEST  2 VIEW COMPARISON:  Sep 05, 2015 FINDINGS: No pneumothorax. The cardiomediastinal silhouette is stable. No pulmonary nodules or masses. There is a tiny effusion on the right with underlying atelectasis. No other acute interval changes or suspicious abnormalities. IMPRESSION: Small right  effusion with underlying atelectasis. No other acute abnormality. Electronically Signed   By: Dorise Bullion III M.D   On: 09/09/2015 08:37   Dg Chest 2 View  09/05/2015  CLINICAL DATA:  Four days of chest congestion, shortness of breath since aortic valve replacement and Maze procedure of August 22, 2015. EXAM: CHEST  2 VIEW COMPARISON:  Portable chest x-ray of Sep 04, 2015 and PA and lateral chest x-ray of Sep 17, 2015. FINDINGS: The lungs are well-expanded. There is a small right pleural effusion and trace left pleural effusion. There is no pneumothorax. The interstitial markings of both lungs are increased. The cardiac silhouette is top-normal in size. The central pulmonary vascularity is mildly prominent. The left atrial appendage clip is in stable position. The prosthetic aortic valve ring appears appropriately positioned. The permanent pacemaker generator and electrodes appear to be appropriately positioned. The sternal wires are intact. The thoracic spine exhibits disc space narrowing at multiple levels. IMPRESSION: CHF superimposed upon COPD. Small right pleural effusion. There has not been significant interval change since yesterday's study. Electronically Signed   By: David  Martinique M.D.   On: 09/05/2015 08:12   Dg Chest 2 View  09/02/2015  CLINICAL DATA:  65 year old male with a history of shortness of breath. Surgery 08/22/2015 with  aortic valve replacement and 25 mm pericardial valve, left atrial clipping, left atrial Maze procedure EXAM: CHEST - 2 VIEW COMPARISON:  08/27/2015 FINDINGS: Cardiomediastinal silhouette likely unchanged though partially obscured by overlying lung and pleural disease on the right. Surgical changes of prior median sternotomy, aortic valve replacement, and atrial amputation. Cardiac pacing device on left chest wall unchanged. Previous pneumothorax is not visualized. Interstitial opacities with interlobular septal thickening and increasing right-sided pleural effusion. Opacity at the right base obscures the right hemidiaphragm and right heart border. No displaced fracture. Unremarkable appearance of the upper abdomen. IMPRESSION: Bilateral interstitial opacities and interlobular septal thickening with right greater than left basilar opacity, potentially a combination of edema, atelectasis, and/or consolidation. Prior right-sided pneumothorax not visualized on the current study. Surgical changes of median sternotomy, aortic valve replacement, and left atrial amputation. Signed, Dulcy Fanny. Earleen Newport, DO [facility] Radiology Electronically Signed   By: Corrie Mckusick D.O.   On: 09/02/2015 21:07   Dg Chest 2 View  08/27/2015  CLINICAL DATA:  65 year old male with history of open heart surgery. EXAM: CHEST  2 VIEW COMPARISON:  Chest x-ray 08/25/2015. FINDINGS: Lung volumes are normal. Minimal bibasilar opacities likely reflect areas of mild subsegmental atelectasis. Small right-sided hydropneumothorax, with the pneumothorax component occupying approximately 5% of the volume of the right hemithorax which appears very similar to the prior study. No left-sided pneumothorax. No evidence of pulmonary edema. There is some mild cephalization of the pulmonary vasculature. Heart size is normal. Upper mediastinal contours are within normal limits. Atherosclerosis in the thoracic aorta.  Status post median sternotomy for aortic valve replacement (a stented bioprosthesis is noted), and left atrial appendage ligation (a ligation clip is noted). Left-sided pacemaker device in place with lead tips projecting over the expected location of the right atrium and right ventricular apex. IMPRESSION: 1. Postoperative changes and support apparatus, as above. 2. Stable to slightly improved small right-sided hydropneumothorax compared to prior study 08/25/2015. 3. Low lung volumes with bibasilar subsegmental atelectasis. 4. Atherosclerosis. Electronically Signed   By: Vinnie Langton M.D.   On: 08/27/2015 09:34   Dg Chest 2 View  08/25/2015  CLINICAL DATA:  Status post aortic valve  replacement. Right apical pneumothorax. EXAM: CHEST  2 VIEW COMPARISON:  Chest x-rays dated 08/24/2015, 08/23/2015 and 08/18/2015 FINDINGS: The small right pneumothorax is slightly more prominent with a small right hydro pneumothorax component at the right base laterally which appears to be new. Slight cardiomegaly. Pulmonary vascularity is normal. New slight atelectasis at the left lung base. Small bilateral pleural effusions. Aortic valve replacement, left atrial appendage clip, and pacemaker are in place. IMPRESSION: Slight increase in small right hydro pneumothorax. Small bilateral pleural effusions. New slight atelectasis at the left lung apex. Electronically Signed   By: Lorriane Shire M.D.   On: 08/25/2015 07:33   Dg Chest 2 View  08/18/2015  CLINICAL DATA:  Aortic stenosis EXAM: CHEST  2 VIEW COMPARISON:  Sep 02, 2014 FINDINGS: There is no edema or consolidation. Heart size and pulmonary vascularity are within normal limits. Pacemaker leads are attached to the right atrium and right ventricle. No adenopathy. No pneumothorax. There is mild degenerative change in the thoracic spine. IMPRESSION: Pacemaker lead tips are attached to the right atrium and right ventricle. No pneumothorax. Heart size within normal limits. No  edema or consolidation. Electronically Signed   By: Lowella Grip III M.D.   On: 08/18/2015 09:05   Ct Head Wo Contrast  09/03/2015  CLINICAL DATA:  Hallucinations for several days EXAM: CT HEAD WITHOUT CONTRAST TECHNIQUE: Contiguous axial images were obtained from the base of the skull through the vertex without intravenous contrast. COMPARISON:  None. FINDINGS: There is age related volume loss. There is no intracranial mass, hemorrhage, extra-axial fluid collection, or midline shift. There is mild small vessel disease in the centra semiovale bilaterally. Elsewhere gray-white compartments appear normal. No acute infarct evident. The bony calvarium appears intact. The mastoid air cells are clear. No intraorbital lesions are present. IMPRESSION: Age related volume loss with mild periventricular small vessel disease. No intracranial mass, hemorrhage, or acute appearing infarct. Electronically Signed   By: Lowella Grip III M.D.   On: 09/03/2015 09:20   Ct Chest Wo Contrast  09/03/2015  CLINICAL DATA:  Patient with shortness of breath. Bilateral pleural effusions. EXAM: CT CHEST WITHOUT CONTRAST TECHNIQUE: Multidetector CT imaging of the chest was performed following the standard protocol without IV contrast. COMPARISON:  Chest radiograph 09/02/2015 FINDINGS: Mediastinum/Nodes: Multi lead pacer apparatus overlies the left hemi thorax. Multiple prominent mediastinal lymph nodes are present measuring up to 11 mm within the subcarinal location (image 66; series 2) and 11 mm within the pretracheal location (image 40; series 2). Ascending thoracic aorta measures 4.3 cm. Heart is enlarged. No pericardial effusion. There a few small foci of gas anterior to the pericardial fat (image 101; series 2). Lungs/Pleura: Central airways are patent. Patchy ground-glass opacities throughout the lungs bilaterally. 2 mm left upper lobe pulmonary nodule (Image 21; series 9). Subpleural consolidation within the right middle and  right lower lobes. Moderate right and small left pleural effusions. Upper abdomen: Masslike high attenuation within the stomach measuring up to 6 cm. Musculoskeletal: Patient status post median sternotomy. Mild stranding within the retrosternal fat likely postoperative in etiology. IMPRESSION: Moderate right and small left pleural effusions. Small amount of gas within the anterior right hemi thorax along the pericardial fat, likely postoperative in etiology. Patchy ground-glass opacities throughout the lungs bilaterally nonspecific however favored to represent pulmonary edema. Consolidation within the right middle and right lower lobe may represent atelectasis. Infection not excluded. Nonspecific high attenuation within the stomach may potentially represent ingested contents. Consider correlation with nonemergent upper  GI to exclude the possibility of gastric mass. Interval increase in size of multiple mildly enlarged mediastinal lymph nodes which are nonspecific however likely reactive in etiology given recent surgical history. 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 Recommend annual imaging followup by CTA or MRA. This recommendation follows 2010 ACCF/AHA/AATS/ACR/ASA/SCA/SCAI/SIR/STS/SVM Guidelines for the Diagnosis and Management of Patients with Thoracic Aortic Disease. Circulation. 2010; 121: U383-F383 Electronically Signed   By: Lovey Newcomer M.D.   On: 09/03/2015 10:18   Dg Chest Port 1 View  08/24/2015  CLINICAL DATA:  Aortic valve replacement. EXAM: PORTABLE CHEST 1 VIEW COMPARISON:  08/23/2015. FINDINGS: Interim removal Swan-Ganz catheter. Left IJ sheath in stable position. Cardiac pacer in stable position. Prior cardiac valve replacement. Left atrial appendage clip noted. Cardiomegaly with mild bilateral pulmonary interstitial prominence. Right apical pneumothorax has improved. Mild residual. No pleural effusion. IMPRESSION: 1. Right apical pneumothorax has improved.  Mild residual. 2.  Cardiac pacer noted in stable position . Prior cardiac valve replacement. Left atrial appendage clip noted. Stable cardiomegaly. Stable mild interstitial prominence again noted. A mild component congestive heart failure cannot be excluded . Electronically Signed   By: Marcello Moores  Register   On: 08/24/2015 07:45   Dg Chest Port 1 View  08/23/2015  CLINICAL DATA:  Status post aortic valve replacement EXAM: PORTABLE CHEST 1 VIEW COMPARISON:  Portable chest x-ray of August 22, 2015 FINDINGS: The lungs appear better inflated today but there is a right-sided pneumothorax amounting to approximately 10% of the lung volume. There is no pleural effusion. The heart is mildly enlarged. The pulmonary vascularity is mildly prominent centrally. The pulmonary interstitial markings remain mildly increased. The Swan-Ganz catheter tip projects in the distal aspect of the main pulmonary artery outflow tract. The permanent pacemaker is in stable position. A left atrial appendage clip is visible. A valve ring is visible in the aortic position but no definite mitral valve ring is observed. IMPRESSION: 1. New 10% right apical pneumothorax. Interval extubation of the trachea and esophagus. Improved aeration of both lungs despite the right pneumothorax. The remaining support tubes are in reasonable position. Critical Value/emergent results were called by telephone at the time of interpretation on 08/23/2015 at 7:51 am to [facility], RN, who verbally acknowledged the results. Electronically Signed   By: David  Martinique M.D.   On: 08/23/2015 07:54   Dg Chest Port 1 View  08/22/2015  CLINICAL DATA:  65 year old male with a history of aortic valve repair, Maze procedure, left atrial appendage amputation EXAM: PORTABLE CHEST 1 VIEW COMPARISON:  08/18/2015 FINDINGS: Interval surgical changes of median sternotomy, left atrial appendage amputation, aortic valve replacement. Endotracheal tube terminates suitably above the carina, approximately 4.4  cm. Left IJ central catheter, through which a Swan-Ganz catheter is transmitted, terminating in the main pulmonary artery. Cardiac pacing device on left chest wall is unchanged, with 2 leads in place. Gastric tube terminates in the left upper quadrant. Single mediastinal drain. No visualized pneumothorax. No large pleural effusion. Lung volumes are low with atelectatic changes peer IMPRESSION: Early postoperative changes of median sternotomy, aortic valve replacement, left atrial appendage amputation. Endotracheal tube terminates suitably above the carina. Left IJ approach sheath transmitting Swan-Ganz catheter, with the tip of the catheter terminating in the main pulmonary artery. Gastric tube terminates out of the field of view. Mediastinal drain projects over the midline. Low lung volumes with atelectatic changes and no evidence of pneumothorax or large pleural effusion. Signed, Dulcy Fanny. Earleen Newport, DO [facility]  Radiology Electronically Signed   By: Corrie Mckusick D.O.   On: 08/22/2015 13:37   Dg Duanne Limerick  W/kub  09/04/2015  CLINICAL DATA:  Possible gastric mass seen on CT 09/03/2015 EXAM: UPPER GI SERIES WITH KUB TECHNIQUE: After obtaining a scout radiograph a routine upper GI series was performed using thin barium FLUOROSCOPY TIME:  Radiation Exposure Index (as provided by the fluoroscopic device): 481 microGy*m^2 COMPARISON:  09/03/2015 FINDINGS: Noncontrast chest CT showed dependent high density in the stomach of uncertain etiology. Initial KUB demonstrates gas throughout the large and small bowel without overtly dilated small bowel. Trace right pleural effusions suspected. Aortic valve prosthesis with atrial clip noted. The patient is recently postoperative for open heart surgery, and accordingly is not able to perform all of the maneuvering and prone positioning that is part of a typical upper GI exam. We performed a single contrast exam with the patient primarily  in the prone and posterior oblique positions. This can make it difficult to coat all of the sides of the stomach, but of course we are easily able to coat the posterior stomach were there was concern on the CT. We demonstrate abnormal morphology and appearance of the stomach. Primary peristaltic waves are generally intact and no esophageal stricture is seen. The anterior portion of the distal stomach antrum and body was difficult to coat. I did use a balloon compression to obtain compressed views of the stomach although I was a bit limited in how heart activate compress due to the recent median sternotomy. No gastric mass is identified, particularly in the vicinity of concern along the fundus and posterior body. contrast medium extended into the duodenum and jejunum. IMPRESSION: 1. No gastric mass is identified. Electronically Signed   By: Van Clines M.D.   On: 09/04/2015 14:31   US Thoracentesis Asp Pleural Space W/img Guide  09/04/2015  INDICATION: Symptomatic right sided pleural effusion EXAM: US THORACENTESIS ASP PLEURAL SPACE W/IMG GUIDE COMPARISON:  None. MEDICATIONS: 10 cc 1% lidocaine. COMPLICATIONS: None immediate. TECHNIQUE: Informed written consent was obtained from the patient after a discussion of the risks, benefits and alternatives to treatment. A timeout was performed prior to the initiation of the procedure. Initial ultrasound scanning demonstrates a right pleural effusion. The lower chest was prepped and draped in the usual sterile fashion. 1% lidocaine was used for local anesthesia. Under direct ultrasound guidance, a 19 gauge, 7-cm, Yueh catheter was introduced. An ultrasound image was saved for documentation purposes. The thoracentesis was performed. The catheter was removed and a dressing was applied. The patient tolerated the procedure well without immediate post procedural complication. The patient was escorted to have an upright chest radiograph. FINDINGS: A total of approximately  1 liters of bloody fluid was removed. Requested samples were sent to the laboratory. IMPRESSION: Successful ultrasound-guided right sided thoracentesis yielding 1 liters of pleural fluid. Read by:  Lavonia Drafts [facility] Electronically Signed   By: Jerilynn Mages.  Shick M.D.   On: 09/04/2015 10:49    Microbiology: Recent Results (from the past 240 hour(s))  Blood culture (routine x 2)     Status: None   Collection Time: 09/02/15  9:16 PM  Result Value Ref Range Status   Specimen Description BLOOD RIGHT ANTECUBITAL  Final   Special Requests IN PEDIATRIC BOTTLE 3CC  Final   Culture NO GROWTH 5 DAYS  Final   Report Status 09/07/2015 FINAL  Final  Blood culture (routine x 2)     Status: None   Collection Time: 09/02/15  9:20 PM  Result Value Ref Range Status   Specimen Description BLOOD LEFT ANTECUBITAL  Final   Special Requests BOTTLES DRAWN AEROBIC ONLY 8CC  Final   Culture NO GROWTH 5 DAYS  Final   Report Status 09/07/2015 FINAL  Final  MRSA PCR Screening     Status: None   Collection Time: 09/03/15 12:16 AM  Result Value Ref Range Status   MRSA by PCR NEGATIVE NEGATIVE Final    Comment:        The GeneXpert MRSA Assay (FDA approved for NASAL specimens only), is one component of a comprehensive MRSA colonization surveillance program. It is not intended to diagnose MRSA infection nor to guide or monitor treatment for MRSA infections.   Culture, Urine     Status: None   Collection Time: 09/03/15 11:33 PM  Result Value Ref Range Status   Specimen Description URINE, CLEAN CATCH  Final   Special Requests NONE  Final   Culture NO GROWTH 2 DAYS  Final   Report Status 09/05/2015 FINAL  Final  Culture, body fluid-bottle     Status: None   Collection Time: 09/04/15 10:24 AM  Result Value Ref Range Status   Specimen Description FLUID RIGHT PLEURAL  Final   Special Requests BOTTLES DRAWN AEROBIC AND ANAEROBIC 10CC  Final   Culture NO GROWTH 5 DAYS  Final   Report Status 09/09/2015 FINAL  Final   Gram stain     Status: None   Collection Time: 09/04/15 10:24 AM  Result Value Ref Range Status   Specimen Description FLUID RIGHT PLEURAL  Final   Special Requests NONE  Final   Gram Stain   Final    RARE WBC PRESENT, PREDOMINANTLY PMN NO ORGANISMS SEEN    Report Status 09/04/2015 FINAL  Final     Labs: Basic Metabolic Panel:  Recent Labs Lab 09/05/15 0627 09/06/15 0400 09/07/15 0438 09/08/15 0451 09/09/15 0403  NA 139 138 139 138 137  K 3.9 4.1 4.3 4.3 4.8  CL 97* 98* 93* 93* 93*  CO2 30 26 34* 33* 33*  GLUCOSE 106* 103* 113* 102* 103*  BUN 31* 26* 21* 17 18  CREATININE 1.46* 1.30* 1.43* 1.50* 1.69*  CALCIUM 8.3* 8.1* 9.0 8.9 8.9   Liver Function Tests:  Recent Labs Lab 09/02/15 2005  AST 30  ALT 28  ALKPHOS 108  BILITOT 1.3*  PROT 6.5  ALBUMIN 2.8*   No results for input(s): LIPASE, AMYLASE in the last 168 hours. No results for input(s): AMMONIA in the last 168 hours. CBC:  Recent Labs Lab 09/02/15 2005  09/05/15 0627 09/06/15 0400 09/07/15 0438 09/08/15 0451 09/09/15 0403  WBC 18.0*  < > 17.0* 14.0* 14.6* 14.7* 11.0*  NEUTROABS 15.4*  --   --   --   --   --   --   HGB 9.1*  < > 9.1* 9.2* 9.7* 9.7* 9.7*  HCT 29.0*  < > 28.0* 29.3* 29.9* 30.8* 30.5*  MCV 85.3  < > 88.3 85.2 87.7 88.5 88.4  PLT 361  < > 417* 344 453* 422* 342  < > = values in this interval not displayed. Cardiac Enzymes:  Recent Labs Lab 09/04/15 1258 09/04/15 1845 09/05/15 0056 09/05/15 0627 09/05/15 1238  TROPONINI 0.14* 0.15* 0.15* 0.15* 0.14*   BNP: BNP (last 3 results)  Recent Labs  09/02/15 2011  BNP 206.2*    ProBNP (last 3 results) No results for input(s): PROBNP in the last 8760 hours.  CBG:  Recent Labs Lab 09/08/15 1117 09/08/15 1641 09/08/15 2043 09/09/15 0610 09/09/15 1117  GLUCAP 124* 87 148* 91 120*    Signed:  Velvet Bathe MD.  [facility] 09/09/2015, 2:12 PM

## 2015-09-09 NOTE — Telephone Encounter (Signed)
Triage  Give post hospiotal fu < 7d - Dr Sallee Provencal patient. APP fu fine  Dr. Brand Males, M.D., [facility].C.P Pulmonary and Critical Care Medicine Staff Physician [place] [facility] Pager: [phone], If no answer or between  15:00h - 7:00h: call [phone]  09/09/2015 12:01 PM

## 2015-09-09 NOTE — Progress Notes (Signed)
Pulmicort would not scan, called phar and had them come verify it was right med  Checked PT MRN and birth date

## 2015-09-09 NOTE — Consult Note (Signed)
PULMONARY / CRITICAL CARE MEDICINE   Name: [name] MRN: [id number] DOB: 20-Jun-1950    ADMISSION DATE:  09/02/2015 CONSULTATION DATE:  09/08/15   REFERRING MD:  Dr Nils Pyle  CHIEF COMPLAINT:  dyspnea  BRIEF   65 year old male. Followed by Dr Lamonte Sakai for "asthma": - last seen sumemr 2016 and symbicort held due to possible upper airway irritation. Prior to that in 2015 CPST showed multifactorial likely circulatory limitation for dyspnea. On 08/21/2015 his pulmonary function test showed a mixed obstructive restrictive pattern with an FVC of 2.25 L/49%, FEV1 1.6 L/47% and a ratio of 71. Total lung capacity 5.9 L/84% and DLCO 17.7/54%.   He was admitted 08/22/18`7 by [facility] valve replacement associated with maze procedure and discharged on 08/28/2015. However he was readmitted 09/02/2015 with worsening shortness of breath and hypoxemia. Noticed to have a right pleural effusion that was bloody on thoracentesis. Repeat echo showed normal ejection fraction. He has been diuresed. He is beginning to feel better and close to baseline. He still has some occasional wheezing for which Pulmicort nebulizers just been started today that his planned discharge for tomorrow 09/09/2015 but he wanted to see pulmonary before he left. He endorses overall feeling well and in good spirits.   SUBJECTIVE/OVERNIGHT/INTERVAL HX 09/09/15: doing better. Wife feels he can be sent home today. Denies wheezing. RN says he walked well  VITAL SIGNS: BP 125/51 mmHg  Pulse 69  Temp(Src) 98.7 F (37.1 C) (Oral)  Resp 18  Ht 5\' 11"  (1.803 m)  Wt 90.4 kg (199 lb 4.7 oz)  BMI 27.81 kg/m2  SpO2 97%  HEMODYNAMICS:    VENTILATOR SETTINGS:    INTAKE / OUTPUT: I/O last 3 completed shifts: In: 480 [P.O.:480] Out: 200 [Urine:200]  PHYSICAL EXAMINATION: BP 125/51 mmHg  Pulse 69  Temp(Src) 98.7 F (37.1 C) (Oral)  Resp 18  Ht 5\' 11"  (1.803 m)  Wt 90.4 kg (199 lb 4.7 oz)  BMI 27.81 kg/m2  SpO2 97%  General  Appearance:    Alert, cooperative, no distress, appears stated age  Head:    Normocephalic, without obvious abnormality, atraumatic  Eyes:    PERRL, conjunctiva/corneas clear, EOM's intact, fundi    benign, both eyes       Ears:    Normal TM's and external ear canals, both ears  Nose:   Nares normal, septum midline, mucosa normal, no drainage   or sinus tenderness  Throat:   Lips, mucosa, and tongue normal; teeth and gums normal  Neck:   Supple, symmetrical, trachea midline, no adenopathy;       thyroid:  No enlargement/tenderness/nodules; no carotid   bruit or JVD  Back:     Symmetric, no curvature, ROM normal, no CVA tenderness  Lungs:     Clear to auscultation bilaterally, respirations unlabored and NO WHEEZE - RESOLVED   Chest wall:    No tenderness or deformity - Recent scar surgery present.   Heart:    Regular rate and rhythm, S1 and S2 normal, no murmur, rub   or gallop  Abdomen:     Soft, non-tender, bowel sounds active all four quadrants,    no masses, no organomegaly  Genitalia:  Not examined   Rectal:  Not examined   Extremities:   Extremities normal, atraumatic, no cyanosis or edema  Pulses:   2+ and symmetric all extremities  Skin:   Skin color, texture, turgor normal, no rashes or lesions  Lymph nodes:   Cervical, supraclavicular, and axillary  nodes normal  Neurologic:   CNII-XII intact. Normal strength, sensation and reflexes      throughout     LABS:  PULMONARY No results for input(s): PHART, PCO2ART, PO2ART, HCO3, TCO2, O2SAT in the last 168 hours.  Invalid input(s): PCO2, PO2  CBC  Recent Labs Lab 09/07/15 0438 09/08/15 0451 09/09/15 0403  HGB 9.7* 9.7* 9.7*  HCT 29.9* 30.8* 30.5*  WBC 14.6* 14.7* 11.0*  PLT 453* 422* 342    COAGULATION  Recent Labs Lab 09/02/15 2011  INR 2.08*    CARDIAC    Recent Labs Lab 09/04/15 1258 09/04/15 1845 09/05/15 0056 09/05/15 0627 09/05/15 1238  TROPONINI 0.14* 0.15* 0.15* 0.15* 0.14*   No results  for input(s): PROBNP in the last 168 hours.   CHEMISTRY  Recent Labs Lab 09/05/15 0627 09/06/15 0400 09/07/15 0438 09/08/15 0451 09/09/15 0403  NA 139 138 139 138 137  K 3.9 4.1 4.3 4.3 4.8  CL 97* 98* 93* 93* 93*  CO2 30 26 34* 33* 33*  GLUCOSE 106* 103* 113* 102* 103*  BUN 31* 26* 21* 17 18  CREATININE 1.46* 1.30* 1.43* 1.50* 1.69*  CALCIUM 8.3* 8.1* 9.0 8.9 8.9   Estimated Creatinine Clearance: 49.4 mL/min (by C-G formula based on Cr of 1.69).   LIVER  Recent Labs Lab 09/02/15 2005 09/02/15 2011  AST 30  --   ALT 28  --   ALKPHOS 108  --   BILITOT 1.3*  --   PROT 6.5  --   ALBUMIN 2.8*  --   INR  --  2.08*     INFECTIOUS  Recent Labs Lab 09/03/15 1051 09/05/15 0627 09/07/15 0438  PROCALCITON <0.10 <0.10 <0.10     ENDOCRINE CBG (last 3)   Recent Labs  09/08/15 2043 09/09/15 0610 09/09/15 1117  GLUCAP 148* 91 120*         IMAGING x48h  - image(s) personally visualized  -   highlighted in bold Dg Chest 2 View  09/09/2015  CLINICAL DATA:  Shortness of breath. EXAM: CHEST  2 VIEW COMPARISON:  Sep 05, 2015 FINDINGS: No pneumothorax. The cardiomediastinal silhouette is stable. No pulmonary nodules or masses. There is a tiny effusion on the right with underlying atelectasis. No other acute interval changes or suspicious abnormalities. IMPRESSION: Small right effusion with underlying atelectasis. No other acute abnormality. Electronically Signed   By: Dorise Bullion III M.D   On: 09/09/2015 08:37      ASSESSMENT / PLAN:  PULMONARY A: #Baseline history of mild persistent/moderate persistent asthma - unclear if even to diagnosis.  #Currently acute hypoxemic respiratory failure due to right pleural effusion fine cardiac surgery and diastolic dysfunction requiring diuresis. Significant improvement but still some ongoing wheeze as of 09/08/2015. Most likely wheezing his cardiac pulmonary edema acute diastolic dysfunction related  - resolved  wheeze. Significantly better  P:   Agree with Pulmicort nebulizer 2 times a day for discharge  Will add Atrovent nebulizer 4 times daily We'll do Xopenex nebulizer when necessary [given atrial fibrillation issues we'll try to avoid albuterol until cleared by cardiac surgery] Do exertional desaturation test Hold off opd po prednisone Outpatient follow-up with Dr. Lamonte Sakai required - msg sent to his office Avoid ACE inhibitor    PCCM will sign off   Dr. Brand Males, M.D., [facility].C.P Pulmonary and Critical Care Medicine Staff Physician [place] [facility] Pager: [phone], If no answer or between  15:00h - 7:00h: call [phone]  [phone]  09/09/2015 11:58 AM

## 2015-09-09 NOTE — Progress Notes (Signed)
[name] has been walking independantly with his wife in the hallway this morning. Patient encouraged to start walking at home 5 minutes twice a day and to increase his ambulation as he can tolerate and to follow his previous heart healthy low sodium diet recommendations. Will call the patient once he has been discharged to set up outpatient cardiac rehab. Patient and wife agreeable.Encouraged to patient to continue to use his incentive spirometer.

## 2015-09-10 NOTE — Progress Notes (Signed)
Pt/family given discharge instructions, medication lists, follow up appointments, and when to call the doctor.  Pt/family verbalizes understanding. Pt given signs and symptoms of infection. Burton, Sandra McClintock, RN    

## 2015-09-11 ENCOUNTER — Other Ambulatory Visit: Payer: Self-pay | Admitting: *Deleted

## 2015-09-11 ENCOUNTER — Telehealth: Payer: Self-pay | Admitting: Cardiovascular Disease

## 2015-09-11 DIAGNOSIS — I509 Heart failure, unspecified: Secondary | ICD-10-CM

## 2015-09-11 DIAGNOSIS — Z952 Presence of prosthetic heart valve: Secondary | ICD-10-CM

## 2015-09-11 NOTE — Telephone Encounter (Signed)
Pt's wife calling re being on 80 mg Lasix's a day and not on potassium per Dr. Nils Pyle  -pt's wife has questions  pls call [phone]

## 2015-09-11 NOTE — Telephone Encounter (Signed)
Spoke with patient's wife who states patient was recently readmitted to the hospital for SOB and was discharged on furosemide 80 mg and aldactone 12.5 mg and was told to call our office regarding potassium dose.  I reviewed with Dr. Acie Fredrickson and he advised patient should come for bmet tomorrow and then we can adjust doses as needed.  Patient's wife verbalized understanding and agreement.

## 2015-09-11 NOTE — Telephone Encounter (Signed)
LM x 1 for pt Please schedule HFU with one of the NP's on Sep 18, 2015

## 2015-09-12 ENCOUNTER — Other Ambulatory Visit (INDEPENDENT_AMBULATORY_CARE_PROVIDER_SITE_OTHER): Payer: Medicare Other | Admitting: *Deleted

## 2015-09-12 DIAGNOSIS — Z954 Presence of other heart-valve replacement: Secondary | ICD-10-CM

## 2015-09-12 DIAGNOSIS — Z952 Presence of prosthetic heart valve: Secondary | ICD-10-CM

## 2015-09-12 LAB — BASIC METABOLIC PANEL
BUN: 28 mg/dL — ABNORMAL HIGH (ref 7–25)
CO2: 28 mmol/L (ref 20–31)
Calcium: 9.3 mg/dL (ref 8.6–10.3)
Chloride: 91 mmol/L — ABNORMAL LOW (ref 98–110)
Creat: 1.93 mg/dL — ABNORMAL HIGH (ref 0.70–1.25)
Glucose, Bld: 201 mg/dL — ABNORMAL HIGH (ref 65–99)
Potassium: 4.4 mmol/L (ref 3.5–5.3)
Sodium: 133 mmol/L — ABNORMAL LOW (ref 135–146)

## 2015-09-12 NOTE — Addendum Note (Signed)
Addended by: Eulis Foster on: 09/12/2015 11:13 AM   Modules accepted: Orders

## 2015-09-13 ENCOUNTER — Telehealth: Payer: Self-pay | Admitting: Cardiovascular Disease

## 2015-09-13 DIAGNOSIS — I5033 Acute on chronic diastolic (congestive) heart failure: Secondary | ICD-10-CM

## 2015-09-13 DIAGNOSIS — Z952 Presence of prosthetic heart valve: Secondary | ICD-10-CM

## 2015-09-13 NOTE — Telephone Encounter (Signed)
New message      Calling to get lab results from yesterday

## 2015-09-13 NOTE — Progress Notes (Signed)
  Entered in error ROS  This encounter was created in error - please disregard.

## 2015-09-13 NOTE — Telephone Encounter (Signed)
Dr. Acie Fredrickson called and spoke with patient's wife to advise: Hold Lasix until Friday AM     Restart at 40 mg a day    Will recheck BMP at his Appt with Joellen Jersey T hompson next week     He advised her to call back with questions or concerns prior to office visit

## 2015-09-15 MED ORDER — FUROSEMIDE 40 MG PO TABS: 40.0000 mg | ORAL_TABLET | Freq: Every day | ORAL | Status: DC

## 2015-09-15 NOTE — Telephone Encounter (Signed)
Called patient's wife to see how patient is feeling.  She states he is not improved and wonders if there are medication interactions.  I advised her that I will review with one of our pharmacists and call her back.  She thanked me.

## 2015-09-15 NOTE — Telephone Encounter (Signed)
I reviewed patient's medications with Fuller Canada, Pharm D.  She advised there are no specific interactions in patient's current medications but there is concern with multiple medications and elevated creatinine.  She advised that I discuss possibly holding either the valsartan or spironolactone with Dr. Acie Fredrickson and soon follow-up.   Spoke with Dr. Acie Fredrickson about patient's condition and Megan's recommendations.  He advised to have patient hold valsartan through the weekend and work him in for office visit on Monday.  He requests a 2 view chest xray on Monday prior to office visit.  I called and spoke with patient's wife who states patient's main complaint is fatigue.  She verbalized understanding and agreement to hold valsartan Saturday and Sunday as he has already had today's dose.  She is aware of where to take patient for chest xray on Monday and to come in for office visit at 1:30 with Dr. Acie Fredrickson.  I advised her to call the on call provider over the weekend with questions or concerns.  She verbalized understanding and agreement and thanked me for the call.

## 2015-09-15 NOTE — Addendum Note (Signed)
Addended by: Emmaline Life on: 09/15/2015 03:44 PM   Modules accepted: Orders

## 2015-09-18 ENCOUNTER — Encounter: Payer: Self-pay | Admitting: Cardiovascular Disease

## 2015-09-18 ENCOUNTER — Ambulatory Visit (INDEPENDENT_AMBULATORY_CARE_PROVIDER_SITE_OTHER): Payer: Medicare Other | Admitting: Cardiovascular Disease

## 2015-09-18 ENCOUNTER — Ambulatory Visit (INDEPENDENT_AMBULATORY_CARE_PROVIDER_SITE_OTHER): Payer: Medicare Other | Admitting: *Deleted

## 2015-09-18 ENCOUNTER — Encounter: Payer: Self-pay | Admitting: Internal Medicine

## 2015-09-18 ENCOUNTER — Ambulatory Visit
Admission: RE | Admit: 2015-09-18 | Discharge: 2015-09-18 | Disposition: A | Discharge status: Home / Self Care | Payer: Medicare Other | Source: Ambulatory Visit | Attending: Cardiovascular Disease | Admitting: Cardiovascular Disease

## 2015-09-18 VITALS — BP 118/56 | HR 70 | Ht 71.0 in | Wt 208.5 lb

## 2015-09-18 DIAGNOSIS — I482 Chronic atrial fibrillation, unspecified: Secondary | ICD-10-CM

## 2015-09-18 DIAGNOSIS — Z950 Presence of cardiac pacemaker: Secondary | ICD-10-CM

## 2015-09-18 DIAGNOSIS — I5033 Acute on chronic diastolic (congestive) heart failure: Secondary | ICD-10-CM

## 2015-09-18 DIAGNOSIS — Z952 Presence of prosthetic heart valve: Secondary | ICD-10-CM

## 2015-09-18 DIAGNOSIS — Z953 Presence of xenogenic heart valve: Secondary | ICD-10-CM

## 2015-09-18 DIAGNOSIS — I10 Essential (primary) hypertension: Secondary | ICD-10-CM

## 2015-09-18 DIAGNOSIS — Z954 Presence of other heart-valve replacement: Secondary | ICD-10-CM | POA: Diagnosis not present

## 2015-09-18 DIAGNOSIS — R0602 Shortness of breath: Secondary | ICD-10-CM | POA: Diagnosis not present

## 2015-09-18 LAB — CUP PACEART INCLINIC DEVICE CHECK
Battery Remaining Longevity: 38.4
Battery Voltage: 2.89 V
Brady Statistic RA Percent Paced: 0.24 %
Brady Statistic RV Percent Paced: 99.7 %
Date Time Interrogation Session: 20170522143625
Implantable Lead Implant Date: 20131203
Implantable Lead Implant Date: 20131203
Implantable Lead Location: 753859
Implantable Lead Location: 753860
Implantable Lead Model: 1948
Lead Channel Impedance Value: 400 Ohm
Lead Channel Impedance Value: 612.5 Ohm
Lead Channel Pacing Threshold Amplitude: 1 V
Lead Channel Pacing Threshold Amplitude: 1.75 V
Lead Channel Pacing Threshold Amplitude: 1.75 V
Lead Channel Pacing Threshold Pulse Width: 0.5 ms
Lead Channel Pacing Threshold Pulse Width: 0.8 ms
Lead Channel Pacing Threshold Pulse Width: 0.8 ms
Lead Channel Sensing Intrinsic Amplitude: 12 mV
Lead Channel Sensing Intrinsic Amplitude: 4.7 mV
Lead Channel Setting Pacing Amplitude: 2 V
Lead Channel Setting Pacing Amplitude: 3.5 V
Lead Channel Setting Pacing Pulse Width: 0.8 ms
Lead Channel Setting Sensing Sensitivity: 2 mV
Pulse Gen Model: 2210
Pulse Gen Serial Number: 7408461

## 2015-09-18 LAB — BASIC METABOLIC PANEL
BUN: 34 mg/dL — ABNORMAL HIGH (ref 7–25)
CO2: 26 mmol/L (ref 20–31)
Calcium: 9.4 mg/dL (ref 8.6–10.3)
Chloride: 98 mmol/L (ref 98–110)
Creat: 1.64 mg/dL — ABNORMAL HIGH (ref 0.70–1.25)
Glucose, Bld: 160 mg/dL — ABNORMAL HIGH (ref 65–99)
Potassium: 4 mmol/L (ref 3.5–5.3)
Sodium: 136 mmol/L (ref 135–146)

## 2015-09-18 MED ORDER — AMLODIPINE BESYLATE 10 MG PO TABS: 5.0000 mg | ORAL_TABLET | Freq: Every day | ORAL | Status: DC

## 2015-09-18 NOTE — Progress Notes (Signed)
Cardiology Office Note   Date:  09/18/2015   ID:  [name], DOB 1950/05/07, MRN [id number]  PCP:  Velna Hatchet, MD  Cardiologist:   Mertie Moores, MD   Chief Complaint  Patient presents with  . Follow-up    AVR   Problem List: 1. Aortic stenosis - s/p AVR + MAZE procedure   2.   Essential hypertension 3. Chest pain  - Normal cors by cath in 2014 4. Pulmonary Hypertension 5. Atrial fib - s/p Afib ablation ,  S/p pacer  6. Hyperlipidemia-  7. Fibromyalgia - does not sleep well.     History of Present Illness: [name] is a 65 y.o. male who presents for HTN and shortness of breath. I saw him many years ago .  He has been seeing Dr. Einar Gip in the interim.   His main complaint today is that he has severe DOE with any exercise.  Has severe DOE walking 10-20 feet.  Then he has severe nausea with vomitting.    No CP .  Watches his salt.    Has a poor appetite recently  has not lost much weight.  Has fibromyalgia - does not sleep well. Does not have sleep apnea.   Has had several sleep studies.    Has leg and foot swelling - especially at night.  Better in the am .   August 24, 2014:  [name] is doing better.   Still has some dyspnea with exertion.   We started Aldactone during the last visit.   His blood pressure is much better. He's able to walk further and he does not get at her breath quite as easily. He still becomes short of breath if he walks up a hill or walks too fast.  Aug. 29, 2016:  [name] is doing well. Is sleeping better .  Still has episodes of weakness  No syncopal episodes,  Has some dizziness.  No CP or dyspnea.   Did not go for the sleep study because he is sleeping   August 03, 2015:  [name] is seen back today for follow up of his pulmonary HTN and HTN He has been feeling a bit weak and dizzy.  BP is borderline low today   Recent echo shows normal LV function with severe AS He has minimal MR   Sep 18, 2015:  He had AVR on April  25. Not doing As well as he would like. He is not sleeping well at all. He's had some acute worsening of his renal function. We've cut back his Lasix and we have held his valsartan.  Blood pressure still is a little bit low.  Breathing is better.   Using breathing treatments.   Was readmitted to the hospital with pleural effusion  - had a thoracentesis on May 8.     We cut his lasix to 40 mg a day  - still is having a decent urine output.     Past Medical History  Diagnosis Date  . Hypertension   . Hyperlipidemia   . Fibromyalgia   . Obesity   . Mitral regurgitation     trivial  . Aortic stenosis, mild   . Left atrial enlargement     LA size 43mm by echo 11/21/11  . Pacemaker 03/31/2012  . Heart murmur   . Asthma     "little bit" (03/31/2012)  . Exertional dyspnea   . Hypothyroidism     S/P radiation  . DJD (degenerative joint disease)   .  Arthritis of hand     "just a little bit in both hands" (03/31/2012)  . Atrial fibrillation [facility])     dx '04; DCCV '04, placed on flecainide, failed DCCV 04/2010, flecainide stopped; s/p successful A.fib ablation 01/31/12  . First degree AV block     post ablation, ~463ms  . Second degree AV block   . GERD (gastroesophageal reflux disease)   . Restless leg syndrome   . Obstructive sleep apnea     mild by sleep study 2013; pt stated he does not have a machine because "it wasn't bad enough for him to have one"  . Diabetes mellitus without complication ([place])     borderline    Past Surgical History  Procedure Laterality Date  . Finger tendon repair  1980's    "right little finger" (03/31/2012)  . US echocardiography  08/07/2009    EF 55-60%  . Doppler echocardiography  03/11/2002    EF 70-75%  . Cardiovascular stress test  03/12/2002    EF 48%, NO EVIDENCE OF ISCHEMIA  . Tee without cardioversion  01/30/2012    Procedure: TRANSESOPHAGEAL ECHOCARDIOGRAM (TEE);  Surgeon: Larey Dresser, MD;  Location: [place];  Service:  Cardiovascular;  Laterality: N/A;  ablation next day  . Pacemaker insertion  03/31/2012    STJ Accent DR pacemaker implanted by Dr Rayann Heman  . Atrial fibrillation ablation  01/30/2012    PVI by Dr. Rayann Heman  . Lumbar disc surgery  1980's X2;  2000's  . Cardioversion  01/2012; 03/31/2012  . Atrial fibrillation ablation N/A 01/31/2012    Procedure: ATRIAL FIBRILLATION ABLATION;  Surgeon: Thompson Grayer, MD;  Location: [facility] CATH LAB;  Service: Cardiovascular;  Laterality: N/A;  . Cardioversion N/A 02/25/2012    Procedure: CARDIOVERSION;  Surgeon: Thompson Grayer, MD;  Location: [facility] CATH LAB;  Service: Cardiovascular;  Laterality: N/A;  . Permanent pacemaker insertion N/A 03/31/2012    Procedure: PERMANENT PACEMAKER INSERTION;  Surgeon: Thompson Grayer, MD;  Location: [facility] CATH LAB;  Service: Cardiovascular;  Laterality: N/A;  . Cardioversion N/A 03/31/2012    Procedure: CARDIOVERSION;  Surgeon: Thompson Grayer, MD;  Location: [facility] CATH LAB;  Service: Cardiovascular;  Laterality: N/A;  . Left and right heart catheterization with coronary angiogram N/A 10/27/2012    Procedure: LEFT AND RIGHT HEART CATHETERIZATION WITH CORONARY ANGIOGRAM;  Surgeon: Laverda Page, MD;  Location: [facility] CATH LAB;  Service: Cardiovascular;  Laterality: N/A;  . Cardiac catheterization N/A 08/09/2015    Procedure: Right/Left Heart Cath and Coronary Angiography;  Surgeon: Peter M Martinique, MD;  Location: [place] CV LAB;  Service: Cardiovascular;  Laterality: N/A;  . Back surgery      X 3  . Finger surgery Left     Middle finger  . Insert / replace / remove pacemaker      St Jude  . Aortic valve replacement N/A 08/22/2015    Procedure: AORTIC VALVE REPLACEMENT (AVR);  Surgeon: Ivin Poot, MD;  Location: [place];  Service: Open Heart Surgery;  Laterality: N/A;  . Maze N/A 08/22/2015    Procedure: MAZE;  Surgeon: Ivin Poot, MD;  Location: [place];  Service: Open Heart Surgery;  Laterality: N/A;  . Clipping of atrial appendage N/A 08/22/2015     Procedure: CLIPPING OF ATRIAL APPENDAGE;  Surgeon: Ivin Poot, MD;  Location: [place];  Service: Open Heart Surgery;  Laterality: N/A;  . Tee without cardioversion N/A 08/22/2015    Procedure: TRANSESOPHAGEAL ECHOCARDIOGRAM (TEE);  Surgeon: Tharon Aquas  Kerby Less, MD;  Location: [place];  Service: Open Heart Surgery;  Laterality: N/A;     Current Outpatient Prescriptions  Medication Sig Dispense Refill  . amLODipine (NORVASC) 10 MG tablet Take 1 tablet (10 mg total) by mouth daily. 30 tablet 3  . atorvastatin (LIPITOR) 40 MG tablet Take 1 tablet (40 mg total) by mouth daily. 90 tablet 1  . budesonide (PULMICORT) 0.25 MG/2ML nebulizer solution Take 2 mLs (0.25 mg total) by nebulization 2 (two) times daily. 60 mL 0  . Cholecalciferol (VITAMIN D) 2000 UNITS CAPS Take 2,000 Units by mouth daily.    . dabigatran (PRADAXA) 150 MG CAPS capsule Take 1 capsule (150 mg total) by mouth 2 (two) times daily. 60 capsule 1  . fexofenadine (ALLEGRA) 180 MG tablet Take 1 tablet (180 mg total) by mouth daily. 30 tablet 1  . fluticasone (FLONASE) 50 MCG/ACT nasal spray Place 2 sprays into both nostrils 2 (two) times daily. (Patient taking differently: Place 2 sprays into both nostrils 2 (two) times daily as needed for allergies. ) 16 g 2  . furosemide (LASIX) 40 MG tablet Take 1 tablet (40 mg total) by mouth daily. 60 tablet 0  . gabapentin (NEURONTIN) 300 MG capsule Take 600 capsules by mouth at bedtime.   3  . ipratropium (ATROVENT) 0.02 % nebulizer solution Take 2.5 mLs (0.5 mg total) by nebulization 4 (four) times daily. 75 mL 12  . levalbuterol (XOPENEX) 0.63 MG/3ML nebulizer solution Take 3 mLs (0.63 mg total) by nebulization every 6 (six) hours as needed for wheezing or shortness of breath. 3 mL 0  . levothyroxine (SYNTHROID, LEVOTHROID) 175 MCG tablet Take 175 mcg by mouth daily before breakfast.    . Melatonin-Pyridoxine (MELATONEX) 3-10 MG TBCR Take 1 tablet by mouth at bedtime.    . metoprolol tartrate  (LOPRESSOR) 25 MG tablet Take 1 tablet (25 mg total) by mouth 2 (two) times daily. 60 tablet 3  . ondansetron (ZOFRAN ODT) 4 MG disintegrating tablet Take 1 tablet (4 mg total) by mouth every 8 (eight) hours as needed for nausea or vomiting. 10 tablet 0  . pantoprazole (PROTONIX) 40 MG tablet Take 40 mg by mouth daily.    [name] rOPINIRole (REQUIP) 1 MG tablet Take 1 tablet (1 mg total) by mouth daily. 90 tablet 2  . spironolactone (ALDACTONE) 25 MG tablet Take 0.5 tablets (12.5 mg total) by mouth daily. 30 tablet 0  . traMADol (ULTRAM) 50 MG tablet Take 1 tablet by mouth daily.     . valACYclovir (VALTREX) 1000 MG tablet Take 1 tablet by mouth every 12 (twelve) hours as needed (fever blisters).     [name] aspirin 81 MG EC tablet Take 1 tablet (81 mg total) by mouth daily. Swallow whole. (Patient not taking: Reported on 09/18/2015) 30 tablet 12  . valsartan (DIOVAN) 160 MG tablet Take 160 mg by mouth daily. Reported on 09/18/2015     No current facility-administered medications for this visit.    Allergies:   Meloxicam and Vancomycin    Social History:  The patient  reports that he quit smoking about 24 years ago. His smoking use included Cigarettes. He has a 7.5 pack-year smoking history. He has never used smokeless tobacco. He reports that he drinks alcohol. He reports that he uses illicit drugs (Marijuana).   Family History:  The patient's family history includes Heart failure in his mother; Stroke in his father.    ROS:  Please see the history of present illness.  Review of Systems: Constitutional:  denies fever, chills, diaphoresis, appetite change and fatigue.  HEENT: denies photophobia, eye pain, redness, hearing loss, ear pain, congestion, sore throat, rhinorrhea, sneezing, neck pain, neck stiffness and tinnitus.  Respiratory: admits to SOB, DOE,    Cardiovascular: admits to   leg swelling.  Gastrointestinal: admits to nausea, vomiting, with walking   Genitourinary: denies dysuria,  urgency, frequency, hematuria, flank pain and difficulty urinating.  Musculoskeletal: denies  myalgias, back pain, joint swelling, arthralgias and gait problem.   Skin: denies pallor, rash and wound.  Neurological: denies dizziness, seizures, syncope, weakness, light-headedness, numbness and headaches.   Hematological: denies adenopathy, easy bruising, personal or family bleeding history.  Psychiatric/ Behavioral: denies suicidal ideation, mood changes, confusion, nervousness, sleep disturbance and agitation.       All other systems are reviewed and negative.    PHYSICAL EXAM: VS:  BP 118/56 mmHg  Pulse 70  Ht 5\' 11"  (1.803 m)  Wt 208 lb 8 oz (94.575 kg)  BMI 29.09 kg/m2 , BMI Body mass index is 29.09 kg/(m^2). GEN: Well nourished, well developed, in no acute distress HEENT: normal Neck: no JVD, carotid bruits, or masses Cardiac: RRR; he has a 2/6 systolic murmur at the upper left sternal border. No , rubs, or gallops,no significant edema  Respiratory:  clear to auscultation bilaterally, normal work of breathing GI: soft, nontender, nondistended, + BS MS: no deformity or atrophy Skin: warm and dry, no rash Neuro:  Strength and sensation are intact Psych: normal   EKG:  EKG is ordered today. The ekg ordered today demonstrates :  Underlying atrial fib.   V pacing at 70   Recent Labs: 08/23/2015: Magnesium 2.4 09/02/2015: ALT 28; B Natriuretic Peptide 206.2* 09/04/2015: TSH 0.441 09/09/2015: Hemoglobin 9.7*; Platelets 342 09/12/2015: BUN 28*; Creat 1.93*; Potassium 4.4; Sodium 133*    Lipid Panel    Component Value Date/Time   CHOL 114* 08/03/2015 1036   TRIG 111 08/03/2015 1036   HDL 30* 08/03/2015 1036   CHOLHDL 3.8 08/03/2015 1036   VLDL 22 08/03/2015 1036   LDLCALC 62 08/03/2015 1036      Wt Readings from Last 3 Encounters:  09/18/15 208 lb 8 oz (94.575 kg)  09/09/15 199 lb 4.7 oz (90.4 kg)  08/28/15 216 lb 4.8 oz (98.113 kg)      Other studies  Reviewed: Additional studies/ records that were reviewed today include:  Review of the above records demonstrates:    ASSESSMENT AND PLAN:  1. Essential hypertension-  his blood pressure is low today.   I suspect that his aortic stenosis is contributing to this . Will decrease the amlodipine to 5 mg a day and continue  Aldactone to 12.5 a day   2. Aortic stenosis: s/p AVR , bioprosthetic valve  3. Shortness of breath:   He is very winded since his surgery .  He had a postoperative pleural effusion and is now status post thoracentesis. He was given Lasix 40 twice a day but this caused him to be followed depleted and have some renal insufficiency.  Over the past 3 days we have cut his Lasix to 40 mg once a day and stopped the valsartan. He seems to be feeling a little bit better.  We had his pacer interrogated and was found to have a heart rate in the 70-90 range without any rate responsiveness. The enabled the rate responsiveness to allow him to have a heart rate in the 120s when he is active.  Hopefully this will help alleviate some of the shortness of breath.  4. Pulmonary Hypertension- he has moderate Pulmonary HTN by cath - PA pressures in the 50.  5. Atrial fib -has persistent atrial fibrillation by pacer interrogation. Continue Pradaxa.   Will hold pradaxa for cath   6. Hyperlipidemia-  followed by his primary medical doctor  Current medicines are reviewed at length with the patient today.  The patient does not have concerns regarding medicines.  The following changes have been made:  no change  Labs/ tests ordered today include:  No orders of the defined types were placed in this encounter.    Will see him in 6 weeks   , Mertie Moores, MD  09/18/2015 1:44 PM    [facility] [address], [place], [place]  [address] Phone: [phone]; Fax: [phone]

## 2015-09-18 NOTE — Progress Notes (Signed)
Pacemaker add-on check in clinic with Dr. Acie Fredrickson r/t fatigue s/p AVR. Normal device function. RV threshold, impedances consistent with previous measurements. Device programmed to maximize longevity. 93% AT/AF +pradaxa. 2 high ventricular rates noted- EMI from valve replacement surgery. Device programmed at appropriate safety margins. Ventricular HR histogram distribution flat during mode switch- rate response turned on at nominal settings. Estimated longevity 3.1 years Patient enrolled in remote follow-up. ROV wtih JA in June.

## 2015-09-18 NOTE — Patient Instructions (Addendum)
Medication Instructions:  DECREASE Norvasc (Amlodipine) to 5 mg once daily   Labwork: TODAY - basic metabolic panel   Testing/Procedures: None Ordered   Follow-Up: Your physician recommends that you schedule a follow-up appointment in: June with Dr. Rayann Heman for Pacemaker Check   Your physician recommends that you come back for follow-up appointment on: Thursday July 6 at 8:45 am    If you need a refill on your cardiac medications before your next appointment, please call your pharmacy.   Thank you for choosing [facility]! Christen Bame, RN [phone]

## 2015-09-20 ENCOUNTER — Encounter: Payer: Medicare Other | Admitting: Physician Assistant

## 2015-09-20 ENCOUNTER — Telehealth: Payer: Self-pay | Admitting: Cardiovascular Disease

## 2015-09-20 NOTE — Telephone Encounter (Signed)
New message ° ° ° ° ° ° °Calling to get lab results °

## 2015-09-20 NOTE — Telephone Encounter (Signed)
Reviewed lab results and plan of care to continue current medications with patient's wife who verbalized understanding and agreement.

## 2015-09-26 ENCOUNTER — Other Ambulatory Visit: Payer: Self-pay | Admitting: Cardiothoracic Surgery

## 2015-09-26 DIAGNOSIS — Z952 Presence of prosthetic heart valve: Secondary | ICD-10-CM

## 2015-09-27 ENCOUNTER — Ambulatory Visit (INDEPENDENT_AMBULATORY_CARE_PROVIDER_SITE_OTHER): Payer: Self-pay | Admitting: Cardiothoracic Surgery

## 2015-09-27 ENCOUNTER — Ambulatory Visit
Admission: RE | Admit: 2015-09-27 | Discharge: 2015-09-27 | Disposition: A | Discharge status: Home / Self Care | Payer: Medicare Other | Source: Ambulatory Visit | Attending: Cardiothoracic Surgery | Admitting: Cardiothoracic Surgery

## 2015-09-27 ENCOUNTER — Telehealth: Payer: Self-pay | Admitting: Internal Medicine

## 2015-09-27 ENCOUNTER — Encounter: Payer: Self-pay | Admitting: Cardiothoracic Surgery

## 2015-09-27 VITALS — BP 139/72 | HR 90 | Resp 20 | Ht 71.0 in | Wt 205.0 lb

## 2015-09-27 DIAGNOSIS — IMO0001 Reserved for inherently not codable concepts without codable children: Secondary | ICD-10-CM

## 2015-09-27 DIAGNOSIS — I350 Nonrheumatic aortic (valve) stenosis: Secondary | ICD-10-CM

## 2015-09-27 DIAGNOSIS — Z8679 Personal history of other diseases of the circulatory system: Secondary | ICD-10-CM

## 2015-09-27 DIAGNOSIS — Z954 Presence of other heart-valve replacement: Secondary | ICD-10-CM

## 2015-09-27 DIAGNOSIS — Z952 Presence of prosthetic heart valve: Secondary | ICD-10-CM

## 2015-09-27 DIAGNOSIS — Z9889 Other specified postprocedural states: Secondary | ICD-10-CM

## 2015-09-27 DIAGNOSIS — J449 Chronic obstructive pulmonary disease, unspecified: Secondary | ICD-10-CM

## 2015-09-27 NOTE — Telephone Encounter (Signed)
RB when I was in [place] covering for you I got a page. Not sure which of the 3 pages rang but possibly call from VIRGEL ARPINO wife with return number of [phone]. I called [facility] to elink

## 2015-09-27 NOTE — Progress Notes (Signed)
PCP is Velna Hatchet, MD Referring Provider is Martinique, Peter M, MD  Chief Complaint  Patient presents with  . Routine Post Op    f/u from surgery with CXR s/p AVR, MAZE,Left atrial clipping, 08/22/15    HPI:one month followup after aVR and left atrial maze procedure for severe aortic stenosis and chronic atrial fibrillation status post pacemaker and previous ablation by EP Patient has chronic lung disease and was rehospitalized for COPD flareup. He is feeling much better now on inhalers including Pulmicort and Xopenex. He currently has some sinus congestion. He is able to walk better with less dyspnea and is anxious about increasing his activity level. The patient has been recommended to start outpatient cardiac rehabilitation and he is agreeable. Surgical incision is healing well. The patient is taking Pradaxa without bleeding complications. The last creatinine was 1.6 and his diuretics have been stopped. The patient's pacemaker rate has been increased and this seems to have helped his activity-exercise tolerance Past Medical History  Diagnosis Date  . Hypertension   . Hyperlipidemia   . Fibromyalgia   . Obesity   . Mitral regurgitation     trivial  . Aortic stenosis, mild   . Left atrial enlargement     LA size 45mm by echo 11/21/11  . Pacemaker 03/31/2012  . Heart murmur   . Asthma     "little bit" (03/31/2012)  . Exertional dyspnea   . Hypothyroidism     S/P radiation  . DJD (degenerative joint disease)   . Arthritis of hand     "just a little bit in both hands" (03/31/2012)  . Atrial fibrillation [facility])     dx '04; DCCV '04, placed on flecainide, failed DCCV 04/2010, flecainide stopped; s/p successful A.fib ablation 01/31/12  . First degree AV block     post ablation, ~440ms  . Second degree AV block   . GERD (gastroesophageal reflux disease)   . Restless leg syndrome   . Obstructive sleep apnea     mild by sleep study 2013; pt stated he does not have a machine because  "it wasn't bad enough for him to have one"  . Diabetes mellitus without complication ([place])     borderline    Past Surgical History  Procedure Laterality Date  . Finger tendon repair  1980's    "right little finger" (03/31/2012)  . US echocardiography  08/07/2009    EF 55-60%  . Doppler echocardiography  03/11/2002    EF 70-75%  . Cardiovascular stress test  03/12/2002    EF 48%, NO EVIDENCE OF ISCHEMIA  . Tee without cardioversion  01/30/2012    Procedure: TRANSESOPHAGEAL ECHOCARDIOGRAM (TEE);  Surgeon: Larey Dresser, MD;  Location: [place];  Service: Cardiovascular;  Laterality: N/A;  ablation next day  . Pacemaker insertion  03/31/2012    STJ Accent DR pacemaker implanted by Dr Rayann Heman  . Atrial fibrillation ablation  01/30/2012    PVI by Dr. Rayann Heman  . Lumbar disc surgery  1980's X2;  2000's  . Cardioversion  01/2012; 03/31/2012  . Atrial fibrillation ablation N/A 01/31/2012    Procedure: ATRIAL FIBRILLATION ABLATION;  Surgeon: Thompson Grayer, MD;  Location: [facility] CATH LAB;  Service: Cardiovascular;  Laterality: N/A;  . Cardioversion N/A 02/25/2012    Procedure: CARDIOVERSION;  Surgeon: Thompson Grayer, MD;  Location: [facility] CATH LAB;  Service: Cardiovascular;  Laterality: N/A;  . Permanent pacemaker insertion N/A 03/31/2012    Procedure: PERMANENT PACEMAKER INSERTION;  Surgeon: Thompson Grayer, MD;  Location:  [place] CATH LAB;  Service: Cardiovascular;  Laterality: N/A;  . Cardioversion N/A 03/31/2012    Procedure: CARDIOVERSION;  Surgeon: Thompson Grayer, MD;  Location: [facility] CATH LAB;  Service: Cardiovascular;  Laterality: N/A;  . Left and right heart catheterization with coronary angiogram N/A 10/27/2012    Procedure: LEFT AND RIGHT HEART CATHETERIZATION WITH CORONARY ANGIOGRAM;  Surgeon: Laverda Page, MD;  Location: [facility] CATH LAB;  Service: Cardiovascular;  Laterality: N/A;  . Cardiac catheterization N/A 08/09/2015    Procedure: Right/Left Heart Cath and Coronary Angiography;  Surgeon: Peter M Martinique,  MD;  Location: [place] CV LAB;  Service: Cardiovascular;  Laterality: N/A;  . Back surgery      X 3  . Finger surgery Left     Middle finger  . Insert / replace / remove pacemaker      [employer] Jude  . Aortic valve replacement N/A 08/22/2015    Procedure: AORTIC VALVE REPLACEMENT (AVR);  Surgeon: Ivin Poot, MD;  Location: [place];  Service: Open Heart Surgery;  Laterality: N/A;  . Maze N/A 08/22/2015    Procedure: MAZE;  Surgeon: Ivin Poot, MD;  Location: [place];  Service: Open Heart Surgery;  Laterality: N/A;  . Clipping of atrial appendage N/A 08/22/2015    Procedure: CLIPPING OF ATRIAL APPENDAGE;  Surgeon: Ivin Poot, MD;  Location: [place];  Service: Open Heart Surgery;  Laterality: N/A;  . Tee without cardioversion N/A 08/22/2015    Procedure: TRANSESOPHAGEAL ECHOCARDIOGRAM (TEE);  Surgeon: Ivin Poot, MD;  Location: [place];  Service: Open Heart Surgery;  Laterality: N/A;    Family History  Problem Relation Age of Onset  . Heart failure Mother   . Stroke Father     Social History Social History  Substance Use Topics  . Smoking status: Former Smoker -- 0.50 packs/day for 15 years    Types: Cigarettes    Quit date: 07/18/1991  . Smokeless tobacco: Never Used  . Alcohol Use: Yes     Comment: occasional    Current Outpatient Prescriptions  Medication Sig Dispense Refill  . amLODipine (NORVASC) 10 MG tablet Take 0.5 tablets (5 mg total) by mouth daily. 30 tablet 11  . atorvastatin (LIPITOR) 40 MG tablet Take 1 tablet (40 mg total) by mouth daily. 90 tablet 1  . budesonide (PULMICORT) 0.25 MG/2ML nebulizer solution Take 2 mLs (0.25 mg total) by nebulization 2 (two) times daily. 60 mL 0  . Cholecalciferol (VITAMIN D) 2000 UNITS CAPS Take 2,000 Units by mouth daily.    . dabigatran (PRADAXA) 150 MG CAPS capsule Take 1 capsule (150 mg total) by mouth 2 (two) times daily. 60 capsule 1  . fexofenadine (ALLEGRA) 180 MG tablet Take 1 tablet (180 mg total) by mouth daily. 30  tablet 1  . fluticasone (FLONASE) 50 MCG/ACT nasal spray Place 2 sprays into both nostrils 2 (two) times daily. (Patient taking differently: Place 2 sprays into both nostrils 2 (two) times daily as needed for allergies. ) 16 g 2  . furosemide (LASIX) 40 MG tablet Take 1 tablet (40 mg total) by mouth daily. 60 tablet 0  . gabapentin (NEURONTIN) 300 MG capsule Take 600 capsules by mouth at bedtime.   3  . ipratropium (ATROVENT) 0.02 % nebulizer solution Take 2.5 mLs (0.5 mg total) by nebulization 4 (four) times daily. 75 mL 12  . levalbuterol (XOPENEX) 0.63 MG/3ML nebulizer solution Take 3 mLs (0.63 mg total) by nebulization every 6 (six) hours as needed for  wheezing or shortness of breath. 3 mL 0  . levothyroxine (SYNTHROID, LEVOTHROID) 175 MCG tablet Take 175 mcg by mouth daily before breakfast.    . Melatonin-Pyridoxine (MELATONEX) 3-10 MG TBCR Take 1 tablet by mouth at bedtime.    . metoprolol tartrate (LOPRESSOR) 25 MG tablet Take 1 tablet (25 mg total) by mouth 2 (two) times daily. 60 tablet 3  . ondansetron (ZOFRAN ODT) 4 MG disintegrating tablet Take 1 tablet (4 mg total) by mouth every 8 (eight) hours as needed for nausea or vomiting. 10 tablet 0  . pantoprazole (PROTONIX) 40 MG tablet Take 40 mg by mouth daily.    [name] rOPINIRole (REQUIP) 1 MG tablet Take 1 tablet (1 mg total) by mouth daily. 90 tablet 2  . traMADol (ULTRAM) 50 MG tablet Take 1 tablet by mouth daily.     . valACYclovir (VALTREX) 1000 MG tablet Take 1 tablet by mouth every 12 (twelve) hours as needed (fever blisters).      No current facility-administered medications for this visit.    Allergies  Allergen Reactions  . Meloxicam Rash  . 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 syndrome 09/02/15, resolved with diphenhydramine and slowing of rate    Review of Systems   Slowly feeling better after aVR-maze Problems with sleeping as expected No significant incisional pain. No productive cough. Postoperative pleural  effusion has not recurred on today's x-ray.  BP 139/72 mmHg  Pulse 90  Resp 20  Ht 5\' 11"  (1.803 m)  Wt 205 lb (92.987 kg)  BMI 28.60 kg/m2  SpO2 98% Physical Exam Alert and comfortable Bilateral rhonchi left greater than right Heart rate regular No murmur, normal aVR closure Chronic venous stasis changes of lower extremities, stable  Diagnostic Tests: Chest x-ray clear with out pleural effusion or edema  Impression: Patient starting to feel better and he is encouraged to start cardiac rehabilitation which will be arranged. His activity level is increased to include driving in light lifting. He should not lift more than 20 pounds until August 1.  Plan:return for followup and monitoring of progress in rehabilitation in 6 weeks. Continue current meds. No prescriptions provided at this office visit.   Len Childs, MD [facility] [phone]

## 2015-09-28 ENCOUNTER — Encounter: Payer: Self-pay | Admitting: Pulmonary Disease

## 2015-09-28 ENCOUNTER — Telehealth: Payer: Self-pay | Admitting: Emergency Medicine

## 2015-09-28 ENCOUNTER — Ambulatory Visit (INDEPENDENT_AMBULATORY_CARE_PROVIDER_SITE_OTHER): Payer: Medicare Other | Admitting: Pulmonary Disease

## 2015-09-28 VITALS — BP 130/64 | HR 73 | Ht 71.0 in | Wt 205.2 lb

## 2015-09-28 DIAGNOSIS — J4541 Moderate persistent asthma with (acute) exacerbation: Secondary | ICD-10-CM

## 2015-09-28 MED ORDER — HYDROCOD POLST-CPM POLST ER 10-8 MG/5ML PO SUER: 5.0000 mL | Freq: Two times a day (BID) | ORAL | Status: DC | PRN

## 2015-09-28 MED ORDER — IPRATROPIUM BROMIDE 0.02 % IN SOLN: 0.5000 mg | Freq: Four times a day (QID) | RESPIRATORY_TRACT | Status: DC

## 2015-09-28 MED ORDER — BUDESONIDE 0.25 MG/2ML IN SUSP: 0.2500 mg | Freq: Two times a day (BID) | RESPIRATORY_TRACT | Status: DC

## 2015-09-28 MED ORDER — LEVALBUTEROL HCL 0.63 MG/3ML IN NEBU: 0.6300 mg | INHALATION_SOLUTION | Freq: Four times a day (QID) | RESPIRATORY_TRACT | Status: DC | PRN

## 2015-09-28 NOTE — Progress Notes (Signed)
Subjective:    Patient ID: [name], male    DOB: [birth year], 65 y.o.   MRN: [id number]  HPI Acute visit for cough, dyspnea, wheezing.  Mr. Leisenring is a 65 year old with past medical history of asthma. He ran out of the pulmicort nebulizer about 3 days ago and noticed worsening cough with clear sputum production. This is associated with increasing dyspnea, chest tightness and wheezing. He denies any fevers, chills. He had a chest x-ray yesterday which does not show any infiltrate or recurrence of effusion.  He also has dyspnea of multifactorial etiology. CPST in past noted reduction in exercise capacity due to obesity and related restrictive lung physiology.  He has a recent aortic valve replacement with Maze procedure on April 17 and an readmission in early may 17 for dyspnea. He was noted to have a large right pleural effusion and underwent a thoracentesis. He was started on Pulmicort nebulizer at that hospitalization because of wheezing.  DATA: PFTs 08/21/15 FVC 2.70 [59%] FEV1 1.91 (56%] F/F 70 TLC 84% DLCO 54% Severe obstructive disease with positive bronchodilator response.  CXR 09/27/15 No acute cardiopulmonary abnormality. No infiltrate, effusion.  Past Medical History  Diagnosis Date  . Hypertension   . Hyperlipidemia   . Fibromyalgia   . Obesity   . Mitral regurgitation     trivial  . Aortic stenosis, mild   . Left atrial enlargement     LA size 40mm by echo 11/21/11  . Pacemaker 03/31/2012  . Heart murmur   . Asthma     "little bit" (03/31/2012)  . Exertional dyspnea   . Hypothyroidism     S/P radiation  . DJD (degenerative joint disease)   . Arthritis of hand     "just a little bit in both hands" (03/31/2012)  . Atrial fibrillation [facility])     dx '04; DCCV '04, placed on flecainide, failed DCCV 04/2010, flecainide stopped; s/p successful A.fib ablation 01/31/12  . 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 Second degree AV block   . GERD  (gastroesophageal reflux disease)   . Restless leg syndrome   . Obstructive sleep apnea     mild by sleep study 2013; pt stated he does not have a machine because "it wasn't bad enough for him to have one"  . Diabetes mellitus without complication ([place])     borderline     Current outpatient prescriptions:  .  amLODipine (NORVASC) 10 MG tablet, Take 0.5 tablets (5 mg total) by mouth daily., Disp: 30 tablet, Rfl: 11 .  atorvastatin (LIPITOR) 40 MG tablet, Take 1 tablet (40 mg total) by mouth daily., Disp: 90 tablet, Rfl: 1 .  budesonide (PULMICORT) 0.25 MG/2ML nebulizer solution, Take 2 mLs (0.25 mg total) by nebulization 2 (two) times daily., Disp: 120 mL, Rfl: 1 .  Cholecalciferol (VITAMIN D) 2000 UNITS CAPS, Take 2,000 Units by mouth daily., Disp: , Rfl:  .  dabigatran (PRADAXA) 150 MG CAPS capsule, Take 1 capsule (150 mg total) by mouth 2 (two) times daily., Disp: 60 capsule, Rfl: 1 .  fexofenadine (ALLEGRA) 180 MG tablet, Take 1 tablet (180 mg total) by mouth daily., Disp: 30 tablet, Rfl: 1 .  fluticasone (FLONASE) 50 MCG/ACT nasal spray, Place 2 sprays into both nostrils 2 (two) times daily. (Patient taking differently: Place 2 sprays into both nostrils 2 (two) times daily as needed for allergies. ), Disp: 16 g, Rfl: 2 .  furosemide (LASIX) 40 MG tablet, Take  1 tablet (40 mg total) by mouth daily., Disp: 60 tablet, Rfl: 0 .  gabapentin (NEURONTIN) 300 MG capsule, Take 600 capsules by mouth at bedtime. , Disp: , Rfl: 3 .  ipratropium (ATROVENT) 0.02 % nebulizer solution, Take 2.5 mLs (0.5 mg total) by nebulization 4 (four) times daily., Disp: 300 mL, Rfl: 1 .  levalbuterol (XOPENEX) 0.63 MG/3ML nebulizer solution, Take 3 mLs (0.63 mg total) by nebulization every 6 (six) hours as needed for wheezing or shortness of breath., Disp: 360 mL, Rfl: 1 .  levothyroxine (SYNTHROID, LEVOTHROID) 175 MCG tablet, Take 175 mcg by mouth daily before breakfast., Disp: , Rfl:  .  Melatonin-Pyridoxine  (MELATONEX) 3-10 MG TBCR, Take 1 tablet by mouth at bedtime., Disp: , Rfl:  .  metoprolol tartrate (LOPRESSOR) 25 MG tablet, Take 1 tablet (25 mg total) by mouth 2 (two) times daily., Disp: 60 tablet, Rfl: 3 .  ondansetron (ZOFRAN ODT) 4 MG disintegrating tablet, Take 1 tablet (4 mg total) by mouth every 8 (eight) hours as needed for nausea or vomiting., Disp: 10 tablet, Rfl: 0 .  pantoprazole (PROTONIX) 40 MG tablet, Take 40 mg by mouth daily., Disp: , Rfl:  .  rOPINIRole (REQUIP) 1 MG tablet, Take 1 tablet (1 mg total) by mouth daily., Disp: 90 tablet, Rfl: 2 .  traMADol (ULTRAM) 50 MG tablet, Take 1 tablet by mouth daily. , Disp: , Rfl:  .  valACYclovir (VALTREX) 1000 MG tablet, Take 1 tablet by mouth every 12 (twelve) hours as needed (fever blisters). , Disp: , Rfl:  .  chlorpheniramine-HYDROcodone (TUSSIONEX PENNKINETIC ER) 10-8 MG/5ML SUER, Take 5 mLs by mouth every 12 (twelve) hours as needed for cough., Disp: 140 mL, Rfl: 0 .  spironolactone (ALDACTONE) 25 MG tablet, Take 25 mg by mouth daily., Disp: , Rfl:    Review of Systems  Cough, clear sputum no fevers, chills. Increasing dyspnea, wheezing, chest tightness. No chest pain, palpitation. No nausea, vomiting, diarrhea, constipation. No fevers, chills. All other review of systems are negative.    Objective:   Physical Exam Blood pressure 130/64, pulse 73, height 5\' 11"  (1.803 m), weight 205 lb 3.2 oz (93.078 kg), SpO2 97 %. Gen: No apparent distress Neuro: No gross focal deficits. HEENT: No JVD, lymphadenopathy, thyromegaly. RS: Clear, No wheeze or crackles CVS: S1-S2 heard, no murmurs rubs gallops. Abdomen: Soft, positive bowel sounds. Musculoskeletal: No edema.    Assessment & Plan:  Evaluation for dyspnea, wheezing.  His symptoms are likely secondary to asthma exacerbation as this started after he ran out of his Pulmicort nebs. PFTs noted from earlier this year with marked bronchodilator response. He likely need to be on  some long term controller medication.  I reviewed his chest x-ray from yesterday which does not show any recurrence of his pleural effusion or infiltrate suggestive of pneumonia, infection. I will try to avoid giving him prednisone or antibiotics. He is not wheezing on examination and is not in resp distress  I will reorder his Pulmicort nebs and he will continue his Xopenex and Atrovent nebs as needed. I have instructed him to call us if his symptoms do not improve or worsen even after starting inhaled corticosteroids.  Plan: - Resume Pulmincot nebs - Continue Xopenex and atrovent nebs  Follow up with Dr. Lamonte Sakai.  Marshell Garfinkel MD [facility] Pager [phone] If no answer or after 3pm call: [phone] 09/28/2015, 5:46 PM

## 2015-09-28 NOTE — Telephone Encounter (Signed)
He was seen in office today. i spoke with pt and his wife today after the OV

## 2015-09-28 NOTE — Patient Instructions (Signed)
We will restart you on your Pulmicort nebulizer and reorder your other nebulizers. Please call us if you're symptoms worsen.  Follow-up with Dr. Lamonte Sakai.

## 2015-09-28 NOTE — Telephone Encounter (Signed)
Ms Escribano called to ask why the script for Tussionex was not at the pharmacy from [place] earlier today. I explained that Tussionex could not be sent electronically and that she should have a paper script. She was able to find the script in his AVS papers, will go get it filled.

## 2015-10-03 ENCOUNTER — Telehealth (HOSPITAL_COMMUNITY): Payer: Self-pay | Admitting: *Deleted

## 2015-10-05 ENCOUNTER — Other Ambulatory Visit: Payer: Self-pay | Admitting: *Deleted

## 2015-10-05 MED ORDER — BUDESONIDE 0.25 MG/2ML IN SUSP: 0.2500 mg | Freq: Two times a day (BID) | RESPIRATORY_TRACT | Status: DC

## 2015-10-17 ENCOUNTER — Telehealth: Payer: Self-pay | Admitting: Cardiovascular Disease

## 2015-10-17 ENCOUNTER — Telehealth: Payer: Self-pay | Admitting: Pulmonary Disease

## 2015-10-17 ENCOUNTER — Encounter (HOSPITAL_COMMUNITY): Payer: Self-pay

## 2015-10-17 ENCOUNTER — Encounter (HOSPITAL_COMMUNITY)
Admission: RE | Admit: 2015-10-17 | Discharge: 2015-10-17 | Disposition: A | Discharge status: Home / Self Care | Payer: Medicare Other | Source: Ambulatory Visit | Attending: Cardiovascular Disease | Admitting: Cardiovascular Disease

## 2015-10-17 VITALS — BP 140/80 | HR 74 | Temp 99.0°F | Ht 70.25 in | Wt 202.6 lb

## 2015-10-17 DIAGNOSIS — Z9889 Other specified postprocedural states: Secondary | ICD-10-CM | POA: Diagnosis present

## 2015-10-17 DIAGNOSIS — I340 Nonrheumatic mitral (valve) insufficiency: Secondary | ICD-10-CM | POA: Insufficient documentation

## 2015-10-17 DIAGNOSIS — Z954 Presence of other heart-valve replacement: Secondary | ICD-10-CM | POA: Insufficient documentation

## 2015-10-17 DIAGNOSIS — I441 Atrioventricular block, second degree: Secondary | ICD-10-CM | POA: Insufficient documentation

## 2015-10-17 DIAGNOSIS — Z79899 Other long term (current) drug therapy: Secondary | ICD-10-CM | POA: Insufficient documentation

## 2015-10-17 DIAGNOSIS — Z8679 Personal history of other diseases of the circulatory system: Secondary | ICD-10-CM | POA: Diagnosis present

## 2015-10-17 DIAGNOSIS — K219 Gastro-esophageal reflux disease without esophagitis: Secondary | ICD-10-CM | POA: Insufficient documentation

## 2015-10-17 DIAGNOSIS — I350 Nonrheumatic aortic (valve) stenosis: Secondary | ICD-10-CM | POA: Insufficient documentation

## 2015-10-17 DIAGNOSIS — Z952 Presence of prosthetic heart valve: Secondary | ICD-10-CM

## 2015-10-17 NOTE — Progress Notes (Signed)
Cardiac Individual Treatment Plan  Patient Details  Name: [name] MRN: [id number] Date of Birth: May 23, 1950 Referring Provider:        CARDIAC REHAB PHASE II ORIENTATION from 10/17/2015 in [place]   Referring Provider  Mertie Moores MD      Initial Encounter Date:       CARDIAC REHAB PHASE II ORIENTATION from 10/17/2015 in [place]   Date  10/17/15   Referring Provider  Nahser, Arnette Norris MD      Visit Diagnosis: S/P AVR  Patient's Home Medications on Admission:  Current outpatient prescriptions:  .  amLODipine (NORVASC) 10 MG tablet, Take 0.5 tablets (5 mg total) by mouth daily., Disp: 30 tablet, Rfl: 11 .  atorvastatin (LIPITOR) 40 MG tablet, Take 1 tablet (40 mg total) by mouth daily., Disp: 90 tablet, Rfl: 1 .  budesonide (PULMICORT) 0.25 MG/2ML nebulizer solution, Take 2 mLs (0.25 mg total) by nebulization 2 (two) times daily., Disp: 360 mL, Rfl: 1 .  Cholecalciferol (VITAMIN D) 2000 UNITS CAPS, Take 2,000 Units by mouth daily., Disp: , Rfl:  .  dabigatran (PRADAXA) 150 MG CAPS capsule, Take 1 capsule (150 mg total) by mouth 2 (two) times daily., Disp: 60 capsule, Rfl: 1 .  fexofenadine (ALLEGRA) 180 MG tablet, Take 1 tablet (180 mg total) by mouth daily., Disp: 30 tablet, Rfl: 1 .  fluticasone (FLONASE) 50 MCG/ACT nasal spray, Place 2 sprays into both nostrils 2 (two) times daily. (Patient taking differently: Place 2 sprays into both nostrils 2 (two) times daily as needed for allergies. ), Disp: 16 g, Rfl: 2 .  furosemide (LASIX) 40 MG tablet, Take 1 tablet (40 mg total) by mouth daily., Disp: 60 tablet, Rfl: 0 .  gabapentin (NEURONTIN) 300 MG capsule, Take 600 capsules by mouth at bedtime. , Disp: , Rfl: 3 .  ipratropium (ATROVENT) 0.02 % nebulizer solution, Take 2.5 mLs (0.5 mg total) by nebulization 4 (four) times daily., Disp: 300 mL, Rfl: 1 .  levalbuterol (XOPENEX) 0.63 MG/3ML nebulizer solution, Take  3 mLs (0.63 mg total) by nebulization every 6 (six) hours as needed for wheezing or shortness of breath., Disp: 360 mL, Rfl: 1 .  levothyroxine (SYNTHROID, LEVOTHROID) 175 MCG tablet, Take 175 mcg by mouth daily before breakfast., Disp: , Rfl:  .  Melatonin-Pyridoxine (MELATONEX) 3-10 MG TBCR, Take 1 tablet by mouth at bedtime., Disp: , Rfl:  .  metoprolol tartrate (LOPRESSOR) 25 MG tablet, Take 1 tablet (25 mg total) by mouth 2 (two) times daily., Disp: 60 tablet, Rfl: 3 .  pantoprazole (PROTONIX) 40 MG tablet, Take 40 mg by mouth daily., Disp: , Rfl:  .  promethazine (PHENERGAN) 25 MG tablet, Take 25 mg by mouth every 6 (six) hours as needed for nausea or vomiting., Disp: , Rfl:  .  rOPINIRole (REQUIP) 1 MG tablet, Take 1 tablet (1 mg total) by mouth daily., Disp: 90 tablet, Rfl: 2 .  spironolactone (ALDACTONE) 25 MG tablet, Take 12.5 mg by mouth daily. , Disp: , Rfl:  .  valACYclovir (VALTREX) 1000 MG tablet, Take 1 tablet by mouth every 12 (twelve) hours as needed (fever blisters). Reported on 10/17/2015, Disp: , Rfl:  .  azithromycin (ZITHROMAX Z-PAK) 250 MG tablet, Take 2 tablets (500 mg) on  Day 1,  followed by 1 tablet (250 mg) once daily on Days 2 through 5., Disp: 6 each, Rfl: 0 .  ondansetron (ZOFRAN ODT) 4 MG disintegrating tablet, Take 1  tablet (4 mg total) by mouth every 8 (eight) hours as needed for nausea or vomiting., Disp: 10 tablet, Rfl: 0 .  traMADol (ULTRAM) 50 MG tablet, Take 1 tablet by mouth daily. Reported on 10/17/2015, Disp: , Rfl:   Past Medical History: Past Medical History  Diagnosis Date  . Hypertension   . Hyperlipidemia   . Fibromyalgia   . Obesity   . Mitral regurgitation     trivial  . Aortic stenosis, mild   . Left atrial enlargement     LA size 26mm by echo 11/21/11  . Pacemaker 03/31/2012  . Heart murmur   . Asthma     "little bit" (03/31/2012)  . Exertional dyspnea   . Hypothyroidism     S/P radiation  . DJD (degenerative joint disease)   . Arthritis  of hand     "just a little bit in both hands" (03/31/2012)  . Atrial fibrillation [facility])     dx '04; DCCV '04, placed on flecainide, failed DCCV 04/2010, flecainide stopped; s/p successful A.fib ablation 01/31/12  . First degree AV block     post ablation, ~47ms  . Second degree AV block   . GERD (gastroesophageal reflux disease)   . Restless leg syndrome   . Obstructive sleep apnea     mild by sleep study 2013; pt stated he does not have a machine because "it wasn't bad enough for him to have one"  . Diabetes mellitus without complication ([place])     borderline    Tobacco Use: History  Smoking status  . Former Smoker -- 0.50 packs/day for 15 years  . Types: Cigarettes  . Quit date: 07/18/1991  Smokeless tobacco  . Never Used    Labs:     Recent Review Flowsheet Data    Labs for ITP Cardiac and Pulmonary Rehab Latest Ref Rng 08/22/2015 08/22/2015 08/22/2015 08/23/2015 08/23/2015   PHART 7.350 - 7.450 7.348(L) - 7.321(L) 7.315(L) -   PCO2ART 35.0 - 45.0 mmHg 50.5(H) - 45.3(H) 41.3 -   HCO3 20.0 - 24.0 mEq/L 28.0(H) - 23.4 21.0 -   TCO2 0 - 100 mmol/L 30 25 25 22 26    ACIDBASEDEF 0.0 - 2.0 mmol/L - - 3.0(H) 5.0(H) -   O2SAT - 98.0 - 98.0 94.0 -      Capillary Blood Glucose: Lab Results  Component Value Date   GLUCAP 120* 09/09/2015   GLUCAP 91 09/09/2015   GLUCAP 148* 09/08/2015   GLUCAP 87 09/08/2015   GLUCAP 124* 09/08/2015     Exercise Target Goals: Date: 10/17/15  Exercise Program Goal: Individual exercise prescription set with THRR, safety & activity barriers. Participant demonstrates ability to understand and report RPE using BORG scale, to self-measure pulse accurately, and to acknowledge the importance of the exercise prescription.  Exercise Prescription Goal: Starting with aerobic activity 30 plus minutes a day, 3 days per week for initial exercise prescription. Provide home exercise prescription and guidelines that participant acknowledges understanding prior to  discharge.  Activity Barriers & Risk Stratification:     Activity Barriers & Cardiac Risk Stratification - 10/19/15 1544    Activity Barriers & Cardiac Risk Stratification   Activity Barriers Fibromyalgia;Arthritis      6 Minute Walk:     6 Minute Walk      10/17/15 1415       6 Minute Walk   Phase Initial     Distance 1401 feet     Walk Time 6 minutes     #  of Rest Breaks 0     MPH 2.65     METS 3.45     RPE 13     VO2 Peak 12.07     Symptoms Yes (comment)     Comments Moderate SOB at end of 6 MWT.     Resting HR 74 bpm     Resting BP 140/80 mmHg     Max Ex. HR 110 bpm     Max Ex. BP 142/70 mmHg     2 Minute Post BP 114/60 mmHg        Initial Exercise Prescription:     Initial Exercise Prescription - 10/18/15 0900    Date of Initial Exercise RX and Referring Provider   Date 10/17/15   Referring Provider Nahser, Philip MD   Treadmill   MPH 2   Grade 1   Minutes 10   METs 2.81   Bike   Level 1.2   Minutes 10   METs 3.45   NuStep   Level 2   Minutes 10   METs 2.5   Prescription Details   Frequency (times per week) 3   Duration Progress to 30 minutes of continuous aerobic without signs/symptoms of physical distress   Intensity   THRR 40-80% of Max Heartrate 62-123   Ratings of Perceived Exertion 11-13   Perceived Dyspnea 0-4   Progression   Progression Continue to progress workloads to maintain intensity without signs/symptoms of physical distress.   Resistance Training   Training Prescription Yes   Weight 2 lbs.   Reps 10-12      Perform Capillary Blood Glucose checks as needed.  Exercise Prescription Changes:   Exercise Comments:   Discharge Exercise Prescription (Final Exercise Prescription Changes):   Nutrition:  Target Goals: Understanding of nutrition guidelines, daily intake of sodium 1500mg , cholesterol 200mg , calories 30% from fat and 7% or less from saturated fats, daily to have 5 or more servings of fruits and  vegetables.  Biometrics:     Pre Biometrics - 10/17/15 1415    Pre Biometrics   Height 5' 10.25" (1.784 m)   Weight 202 lb 9.6 oz (91.9 kg)   Waist Circumference 39 inches   Hip Circumference 42 inches   Waist to Hip Ratio 0.93 %   BMI (Calculated) 28.9   Triceps Skinfold 15 mm   % Body Fat 27.2 %   Grip Strength 42.5 kg   Flexibility 0 in   Single Leg Stand 18.5 seconds       Nutrition Therapy Plan and Nutrition Goals:   Nutrition Discharge: Nutrition Scores:   Nutrition Goals Re-Evaluation:   Psychosocial: Target Goals: Acknowledge presence or absence of depression, maximize coping skills, provide positive support system. Participant is able to verbalize types and ability to use techniques and skills needed for reducing stress and depression.  Initial Review & Psychosocial Screening:     Initial Psych Review & Screening - 10/17/15 1642    Initial Review   Current issues with Current Stress Concerns   Family Dynamics   Good Support System? Yes   Barriers   Psychosocial barriers to participate in program The patient should benefit from training in stress management and relaxation.   Screening Interventions   Interventions Encouraged to exercise      Quality of Life Scores:     Quality of Life - 10/17/15 1415    Quality of Life Scores   Health/Function Pre 27.32 %   Socioeconomic Pre 27.86 %   Psych/Spiritual Pre  30 %   Family Pre 30 %   GLOBAL Pre 28.41 %      PHQ-9:     Recent Review Flowsheet Data    There is no flowsheet data to display.      Psychosocial Evaluation and Intervention:   Psychosocial Re-Evaluation:   Vocational Rehabilitation: Provide vocational rehab assistance to qualifying candidates.   Vocational Rehab Evaluation & Intervention:     Vocational Rehab - 10/17/15 1639    Initial Vocational Rehab Evaluation & Intervention   Assessment shows need for Vocational Rehabilitation No      Education: Education Goals:  Education classes will be provided on a weekly basis, covering required topics. Participant will state understanding/return demonstration of topics presented.  Learning Barriers/Preferences:     Learning Barriers/Preferences - 10/17/15 1639    Learning Barriers/Preferences   Learning Barriers Sight   Learning Preferences Written Material;Skilled Demonstration      Education Topics: Count Your Pulse:  -Group instruction provided by verbal instruction, demonstration, patient participation and written materials to support subject.  Instructors address importance of being able to find your pulse and how to count your pulse when at home without a heart monitor.  Patients get hands on experience counting their pulse with staff help and individually.   Heart Attack, Angina, and Risk Factor Modification:  -Group instruction provided by verbal instruction, video, and written materials to support subject.  Instructors address signs and symptoms of angina and heart attacks.    Also discuss risk factors for heart disease and how to make changes to improve heart health risk factors.   Functional Fitness:  -Group instruction provided by verbal instruction, demonstration, patient participation, and written materials to support subject.  Instructors address safety measures for doing things around the house.  Discuss how to get up and down off the floor, how to pick things up properly, how to safely get out of a chair without assistance, and balance training.   Meditation and Mindfulness:  -Group instruction provided by verbal instruction, patient participation, and written materials to support subject.  Instructor addresses importance of mindfulness and meditation practice to help reduce stress and improve awareness.  Instructor also leads participants through a meditation exercise.    Stretching for Flexibility and Mobility:  -Group instruction provided by verbal instruction, patient participation,  and written materials to support subject.  Instructors lead participants through series of stretches that are designed to increase flexibility thus improving mobility.  These stretches are additional exercise for major muscle groups that are typically performed during regular warm up and cool down.   Hands Only CPR Anytime:  -Group instruction provided by verbal instruction, video, patient participation and written materials to support subject.  Instructors co-teach with AHA video for hands only CPR.  Participants get hands on experience with mannequins.   Nutrition I class: Heart Healthy Eating:  -Group instruction provided by PowerPoint slides, verbal discussion, and written materials to support subject matter. The instructor gives an explanation and review of the Therapeutic Lifestyle Changes diet recommendations, which includes a discussion on lipid goals, dietary fat, sodium, fiber, plant stanol/sterol esters, sugar, and the components of a well-balanced, healthy diet.   Nutrition II class: Lifestyle Skills:  -Group instruction provided by PowerPoint slides, verbal discussion, and written materials to support subject matter. The instructor gives an explanation and review of label reading, grocery shopping for heart health, heart healthy recipe modifications, and ways to make healthier choices when eating out.   Diabetes Question & Answer:  -  Group instruction provided by PowerPoint slides, verbal discussion, and written materials to support subject matter. The instructor gives an explanation and review of diabetes co-morbidities, pre- and post-prandial blood glucose goals, pre-exercise blood glucose goals, signs, symptoms, and treatment of hypoglycemia and hyperglycemia, and foot care basics.   Diabetes Blitz:  -Group instruction provided by PowerPoint slides, verbal discussion, and written materials to support subject matter. The instructor gives an explanation and review of the physiology  behind type 1 and type 2 diabetes, diabetes medications and rational behind using different medications, pre- and post-prandial blood glucose recommendations and Hemoglobin A1c goals, diabetes diet, and exercise including blood glucose guidelines for exercising safely.    Portion Distortion:  -Group instruction provided by PowerPoint slides, verbal discussion, written materials, and food models to support subject matter. The instructor gives an explanation of serving size versus portion size, changes in portions sizes over the last 20 years, and what consists of a serving from each food group.   Stress Management:  -Group instruction provided by verbal instruction, video, and written materials to support subject matter.  Instructors review role of stress in heart disease and how to cope with stress positively.     Exercising on Your Own:  -Group instruction provided by verbal instruction, power point, and written materials to support subject.  Instructors discuss benefits of exercise, components of exercise, frequency and intensity of exercise, and end points for exercise.  Also discuss use of nitroglycerin and activating EMS.  Review options of places to exercise outside of rehab.  Review guidelines for sex with heart disease.   Cardiac Drugs I:  -Group instruction provided by verbal instruction and written materials to support subject.  Instructor reviews cardiac drug classes: antiplatelets, anticoagulants, beta blockers, and statins.  Instructor discusses reasons, side effects, and lifestyle considerations for each drug class.   Cardiac Drugs II:  -Group instruction provided by verbal instruction and written materials to support subject.  Instructor reviews cardiac drug classes: angiotensin converting enzyme inhibitors (ACE-I), angiotensin II receptor blockers (ARBs), nitrates, and calcium channel blockers.  Instructor discusses reasons, side effects, and lifestyle considerations for each drug  class.   Anatomy and Physiology of the Circulatory System:  -Group instruction provided by verbal instruction, video, and written materials to support subject.  Reviews functional anatomy of heart, how it relates to various diagnoses, and what role the heart plays in the overall system.   Knowledge Questionnaire Score:     Knowledge Questionnaire Score - 10/19/15 1546    Knowledge Questionnaire Score   Pre Score        Core Components/Risk Factors/Patient Goals at Admission:     Personal Goals and Risk Factors at Admission - 10/17/15 1640    Core Components/Risk Factors/Patient Goals on Admission    Weight Management Weight Loss;Yes   Intervention Weight Management: Develop a combined nutrition and exercise program designed to reach desired caloric intake, while maintaining appropriate intake of nutrient and fiber, sodium and fats, and appropriate energy expenditure required for the weight goal.;Weight Management: Provide education and appropriate resources to help participant work on and attain dietary goals.;Weight Management/Obesity: Establish reasonable short term and long term weight goals.   Admit Weight 202 lb 9.6 oz (91.9 kg)   Expected Outcomes Short Term: Continue to assess and modify interventions until short term weight is achieved;Long Term: Adherence to nutrition and physical activity/exercise program aimed toward attainment of established weight goal;Understanding of distribution of calorie intake throughout the day with the consumption of 4-5 meals/snacks  Heart Failure Yes   Personal Goal Other Yes   Personal Goal Be able to do ADL's.   Intervention Provide individualized exercise prescription to improve cardiorespiratory fitness and mobility, so patient is able to do ADL's.   Expected Outcomes Maintain regular exercise routine including aerobic exercise, stretching and light hand weights.      Core Components/Risk Factors/Patient Goals Review:    Core  Components/Risk Factors/Patient Goals at Discharge (Final Review):    ITP Comments:     ITP Comments      10/17/15 1638           ITP Comments Dr. Fransico Him, Medical Director           Comments: Patient attended orientation from 1330 to 1530 to review rules and guidelines for program. Completed 6 minute walk test, Intitial ITP, and exercise prescription.  VSS. Telemetry-Vpaced. Patient reported having moderate shortness of breath during the walk test and reports that he has felt shortness of breath since his surgery. Upon assessment lung fields with diminished breath sounds bilaterally. Temperature 99.0. Patient reports having a productive cough of clear to brown secretions at times. Oxygen saturation 98% on room air. Dr Elmarie Shiley office called and notified. I spoke with Sammuel Bailiff RN, triage nurse who recommended that the patient follow up with his pulmonologist or primary care physician.  Dr Agustina Caroli office called and notified. Spoke with the triage nurse. Patient given an appointment to follow up with Clemetine Marker NP on Thursday at 11:15 am. Patient instructed to go to the ED if he develops a high fever or has an increase in his shortness of breath. I also left a message at Dr [facility] office to call the patient about getting an appointment to be seen. Patient and wife states understanding. Patient had no complaints or symptoms upon exit from cardiac rehab. Blood pressure 114/60 heart rate 84. Oxygen saturation 99% on room air.

## 2015-10-17 NOTE — Progress Notes (Signed)
Cardiac Rehab Medication Review by a Pharmacist  Does the patient  feel that his/her medications are working for him/her?  yes  Has the patient been experiencing any side effects to the medications prescribed?  no  Does the patient measure his/her own blood pressure or blood glucose at home?  yes   Does the patient have any problems obtaining medications due to transportation or finances?   no  Understanding of regimen: fair Understanding of indications: good Potential of compliance: excellent  Pharmacist comments: [name] is a 65 yo M who is doing well with his medications. He reports excellence compliance. His wife helps with most of his medications, she did ask about his gabapentin and states it does not seem to last throughout the day. I advised her to discuss with his PCP about dosing the gabapentin multiple times a day at lowed doses.  [name], PharmD. PGY-1 Pharmacy Resident Pager: [phone] 10/17/2015 2:03 PM

## 2015-10-17 NOTE — Telephone Encounter (Signed)
Spoke with Verdis Frederickson at Cardiac Rehab.  She reports the patient is there for his walk test and has some shortness of breath.  He also has a clear to brown productive cough and a temperature of 99.0. Maria listened to his lungs and she said they sound diminished. With Verdis Frederickson, decided to have patient see PCP. She will call and schedule OV.

## 2015-10-17 NOTE — Telephone Encounter (Signed)
Spoke with Verdis Frederickson at [facility].  Patient was still with her.  She said that patient has a low grade fever and SOB. She requested appointment ASAP.  Earliest appointment available was 11:15 on Thursday with Rexene Edison, NP.  Patient took this appointment and was given ER precautions if his fever gets high.  Verdis Frederickson assured me that patient was not in any distress and he would be fine with appointment on Thursday.  Nothing further needed.

## 2015-10-17 NOTE — Telephone Encounter (Signed)
New message     Pt c/o Shortness Of Breath: STAT if SOB developed within the last 24 hours or pt is noticeably SOB on the phone  1. Are you currently SOB (can you hear that pt is SOB on the phone)? Per Verdis Frederickson the pt is SOB I did not hear the  pt  2. How long have you been experiencing SOB? Per Verdis Frederickson for a while  3. Are you SOB when sitting or when up moving around? Moving around   4. Are you currently experiencing any other symptoms? Sight temperature of 99.0 and cough

## 2015-10-19 ENCOUNTER — Ambulatory Visit (INDEPENDENT_AMBULATORY_CARE_PROVIDER_SITE_OTHER): Payer: Medicare Other | Admitting: Adult Health

## 2015-10-19 ENCOUNTER — Ambulatory Visit (INDEPENDENT_AMBULATORY_CARE_PROVIDER_SITE_OTHER)
Admission: RE | Admit: 2015-10-19 | Discharge: 2015-10-19 | Disposition: A | Discharge status: Home / Self Care | Payer: Medicare Other | Source: Ambulatory Visit | Attending: Adult Health | Admitting: Adult Health

## 2015-10-19 ENCOUNTER — Encounter (HOSPITAL_COMMUNITY): Payer: Self-pay

## 2015-10-19 ENCOUNTER — Telehealth (HOSPITAL_COMMUNITY): Payer: Self-pay | Admitting: *Deleted

## 2015-10-19 ENCOUNTER — Encounter: Payer: Self-pay | Admitting: Adult Health

## 2015-10-19 VITALS — BP 140/68 | HR 70 | Temp 98.5°F | Ht 71.0 in | Wt 206.0 lb

## 2015-10-19 DIAGNOSIS — R059 Cough, unspecified: Secondary | ICD-10-CM

## 2015-10-19 DIAGNOSIS — R05 Cough: Secondary | ICD-10-CM

## 2015-10-19 DIAGNOSIS — J4521 Mild intermittent asthma with (acute) exacerbation: Secondary | ICD-10-CM | POA: Diagnosis not present

## 2015-10-19 MED ORDER — AZITHROMYCIN 250 MG PO TABS: ORAL_TABLET | ORAL | Status: AC

## 2015-10-19 NOTE — Progress Notes (Signed)
65 year old male with history of pulmonary hypertension, atrial fibrillation status post ablation and pacemaker, aortic stenosis status post AVR(08/22/15 ) plus MAZA procedure.    TEST  07/2013 PFT that show mixed disease with a definite obstructive component based on a response to bronchodilator 08/05/13. Diffusion corrected for alveolar volume 2-D echo May 2017 showed an EF of 60-65%, aortic valve with mild to moderate regurg, left atrium and right atrium with moderate dilation, pulmonary artery pressure 38  10/19/2015 Acute OV  Since for an acute office visit for persistent cough and wheezing. Complains of  prod cough clear to light brown colored mucus, sinus pressure/drainage, chest congestion/tightness, increased SOB and wheezing, and low grade fever 10/17/15. Denies any nausea or vomiting.  Was seen in the office 09/28/2015 with a asthma flare. Started back on Pulmicort nebulizers twice daily. And Atrovent nebulizers 4 times a day. He does feel that this has helped. However, he has continued to have a cough that is now becoming productive with brown mucus. He's also ran a low-grade fever this week.. Knee chest pain, orthopnea, PND, or increased leg swelling  Pt says since his AVR in April he has been more sob /DOE . CXR 5/31 showed NAD .  On Pradaxa .   Past Medical History  Diagnosis Date  . Hypertension   . Hyperlipidemia   . Fibromyalgia   . Obesity   . Mitral regurgitation     trivial  . Aortic stenosis, mild   . Left atrial enlargement     LA size 64mm by echo 11/21/11  . Pacemaker 03/31/2012  . Heart murmur   . Asthma     "little bit" (03/31/2012)  . Exertional dyspnea   . Hypothyroidism     S/P radiation  . DJD (degenerative joint disease)   . Arthritis of hand     "just a little bit in both hands" (03/31/2012)  . Atrial fibrillation [facility])     dx '04; DCCV '04, placed on flecainide, failed DCCV 04/2010, flecainide stopped; s/p successful A.fib ablation 01/31/12  . First  degree AV block     post ablation, ~4107ms  . Second degree AV block   . GERD (gastroesophageal reflux disease)   . Restless leg syndrome   . Obstructive sleep apnea     mild by sleep study 2013; pt stated he does not have a machine because "it wasn't bad enough for him to have one"  . Diabetes mellitus without complication (HCC)     borderline   . Current Outpatient Prescriptions on File Prior to Visit  Medication Sig Dispense Refill  . amLODipine (NORVASC) 10 MG tablet Take 0.5 tablets (5 mg total) by mouth daily. 30 tablet 11  . atorvastatin (LIPITOR) 40 MG tablet Take 1 tablet (40 mg total) by mouth daily. 90 tablet 1  . budesonide (PULMICORT) 0.25 MG/2ML nebulizer solution Take 2 mLs (0.25 mg total) by nebulization 2 (two) times daily. 360 mL 1  . Cholecalciferol (VITAMIN D) 2000 UNITS CAPS Take 2,000 Units by mouth daily.    . dabigatran (PRADAXA) 150 MG CAPS capsule Take 1 capsule (150 mg total) by mouth 2 (two) times daily. 60 capsule 1  . fexofenadine (ALLEGRA) 180 MG tablet Take 1 tablet (180 mg total) by mouth daily. 30 tablet 1  . fluticasone (FLONASE) 50 MCG/ACT nasal spray Place 2 sprays into both nostrils 2 (two) times daily. (Patient taking differently: Place 2 sprays into both nostrils 2 (two) times daily as needed for allergies. )  16 g 2  . furosemide (LASIX) 40 MG tablet Take 1 tablet (40 mg total) by mouth daily. 60 tablet 0  . gabapentin (NEURONTIN) 300 MG capsule Take 600 capsules by mouth at bedtime.   3  . ipratropium (ATROVENT) 0.02 % nebulizer solution Take 2.5 mLs (0.5 mg total) by nebulization 4 (four) times daily. 300 mL 1  . levalbuterol (XOPENEX) 0.63 MG/3ML nebulizer solution Take 3 mLs (0.63 mg total) by nebulization every 6 (six) hours as needed for wheezing or shortness of breath. 360 mL 1  . levothyroxine (SYNTHROID, LEVOTHROID) 175 MCG tablet Take 175 mcg by mouth daily before breakfast.    . Melatonin-Pyridoxine (MELATONEX) 3-10 MG TBCR Take 1 tablet by  mouth at bedtime.    . metoprolol tartrate (LOPRESSOR) 25 MG tablet Take 1 tablet (25 mg total) by mouth 2 (two) times daily. 60 tablet 3  . ondansetron (ZOFRAN ODT) 4 MG disintegrating tablet Take 1 tablet (4 mg total) by mouth every 8 (eight) hours as needed for nausea or vomiting. 10 tablet 0  . pantoprazole (PROTONIX) 40 MG tablet Take 40 mg by mouth daily.    . promethazine (PHENERGAN) 25 MG tablet Take 25 mg by mouth every 6 (six) hours as needed for nausea or vomiting.    [name] rOPINIRole (REQUIP) 1 MG tablet Take 1 tablet (1 mg total) by mouth daily. 90 tablet 2  . spironolactone (ALDACTONE) 25 MG tablet Take 12.5 mg by mouth daily.     . traMADol (ULTRAM) 50 MG tablet Take 1 tablet by mouth daily. Reported on 10/17/2015    . valACYclovir (VALTREX) 1000 MG tablet Take 1 tablet by mouth every 12 (twelve) hours as needed (fever blisters). Reported on 10/17/2015     No current facility-administered medications on file prior to visit.   ROS  Constitutional:   No  weight loss, night sweats,  Fevers, chills, + fatigue, or  lassitude.  HEENT:   No headaches,  Difficulty swallowing,  Tooth/dental problems, or  Sore throat,                No sneezing, itching, ear ache, + nasal congestion, post nasal drip,   CV:  No chest pain,  Orthopnea, PND, swelling in lower extremities, anasarca, dizziness, palpitations, syncope.   GI  No heartburn, indigestion, abdominal pain, nausea, vomiting, diarrhea, change in bowel habits, loss of appetite, bloody stools.   Resp:   No chest wall deformity  Skin: no rash or lesions.  GU: no dysuria, change in color of urine, no urgency or frequency.  No flank pain, no hematuria   MS:  No joint pain or swelling.  No decreased range of motion.  No back pain.  Psych:  No change in mood or affect. No depression or anxiety.  No memory loss.     EXAM  Danley Danker Vitals:   10/19/15 1120  BP: 140/68  Pulse: 70  Temp: 98.5 F (36.9 C)  TempSrc: Oral  Height: 5'  11" (1.803 m)  Weight: 206 lb (93.441 kg)  SpO2: 94%   GEN: A/Ox3; pleasant , NAD, elderly   HEENT:  [place]/AT,  EACs-clear, TMs-wnl, NOSE-clear, THROAT-clear, no lesions, no postnasal drip or exudate noted.   NECK:  Supple w/ fair ROM; no JVD; normal carotid impulses w/o bruits; no thyromegaly or nodules palpated; no lymphadenopathy.  RESP  Decreased BS in bases w/o wheezes/ rales/ or rhonchi.no accessory muscle use, no dullness to percussion  CARD:  RRR,  + SM  ,  tr  peripheral edema, pulses intact, no cyanosis or clubbing.  GI:   Soft & nt; nml bowel sounds; no organomegaly or masses detected.  Musco: Warm bil, no deformities or joint swelling noted.   Neuro: alert, no focal deficits noted.    Skin: Warm, no lesions or rashes   Tammy Parrett NP-C  [facility] 10/19/2015

## 2015-10-19 NOTE — Assessment & Plan Note (Signed)
Mild flare with bronchitis  Check on CXR   Plan  Zpack take as directed.  Mucinex DM Twice daily  As needed  Cough/congestion  Hold onto prednisone taper for couple of days, if wheezing comes back can begin the taper.  Chest xray today .  Follow up with Dr. Lamonte Sakai 6-8 weeks and As needed   Please contact office for sooner follow up if symptoms do not improve or worsen or seek emergency care

## 2015-10-19 NOTE — Patient Instructions (Addendum)
Zpack take as directed.  Mucinex DM Twice daily  As needed  Cough/congestion  Hold onto prednisone taper for couple of days, if wheezing comes back can begin the taper.  Chest xray today .  Follow up with Dr. Lamonte Sakai 6-8 weeks and As needed   Please contact office for sooner follow up if symptoms do not improve or worsen or seek emergency care

## 2015-10-20 ENCOUNTER — Telehealth: Payer: Self-pay | Admitting: Adult Health

## 2015-10-20 NOTE — Telephone Encounter (Signed)
Spoke with pt's spouse  She is requesting cxr results  TP out of the office so will forward to [place] since this is his pt  Please advise, thanks

## 2015-10-23 ENCOUNTER — Encounter (HOSPITAL_COMMUNITY): Payer: Medicare Other

## 2015-10-23 NOTE — Telephone Encounter (Signed)
Patient requesting CXR results.  Dr. Melvyn Novas, please advise in [name]'s absence.  CLINICAL DATA: Cough, congestion, shortness of breath  EXAM: CHEST 2 VIEW  COMPARISON: 09/27/2015  FINDINGS: There is no focal parenchymal opacity. There is no pleural effusion or pneumothorax. The heart and mediastinal contours are unremarkable. There is evidence of prior aortic valve replacement. There is evidence of prior median sternotomy. There is a dual lead AICD.  The osseous structures are unremarkable.  IMPRESSION: No active cardiopulmonary disease

## 2015-10-23 NOTE — Telephone Encounter (Signed)
Patient's wife notified of CXR results. Nothing further needed.

## 2015-10-23 NOTE — Telephone Encounter (Signed)
No active dz

## 2015-10-25 ENCOUNTER — Other Ambulatory Visit: Payer: Self-pay | Admitting: Cardiology

## 2015-10-25 ENCOUNTER — Telehealth: Payer: Self-pay | Admitting: Internal Medicine

## 2015-10-25 ENCOUNTER — Telehealth: Payer: Self-pay | Admitting: Cardiology

## 2015-10-25 ENCOUNTER — Encounter (HOSPITAL_COMMUNITY)
Admission: RE | Admit: 2015-10-25 | Discharge: 2015-10-25 | Disposition: A | Discharge status: Home / Self Care | Payer: Medicare Other | Source: Ambulatory Visit | Attending: Cardiovascular Disease | Admitting: Cardiovascular Disease

## 2015-10-25 VITALS — BP 138/60 | HR 70 | Ht 70.25 in | Wt 205.9 lb

## 2015-10-25 DIAGNOSIS — I5041 Acute combined systolic (congestive) and diastolic (congestive) heart failure: Secondary | ICD-10-CM

## 2015-10-25 DIAGNOSIS — Z954 Presence of other heart-valve replacement: Secondary | ICD-10-CM | POA: Diagnosis not present

## 2015-10-25 DIAGNOSIS — Z952 Presence of prosthetic heart valve: Secondary | ICD-10-CM

## 2015-10-25 MED ORDER — FUROSEMIDE 40 MG PO TABS: 40.0000 mg | ORAL_TABLET | Freq: Every day | ORAL | Status: DC

## 2015-10-25 NOTE — Telephone Encounter (Signed)
Called spoke with Verdis Frederickson from rehab. She reports she spoke with cardiology and was advised pt to increase his lasix to 80 x 2 days. He is also going to start his prednisone taper given by TP on 10/19/15 for the wheezing as stated at last OV. If pt does not get any better will call back for appt.  Nothing further needed

## 2015-10-25 NOTE — Telephone Encounter (Signed)
Pt was in rehab and was very SOB.  Recently see by Pulmonary and placed on zpak and he is to begin steroid taper tomorrow.  He has gained 1.5 Kg - I have asked him to take extra dose of lasix today then with steroids if sx do not improve to call for appt.  He does have appt with Dr. Acie Fredrickson 11/02/15.

## 2015-10-25 NOTE — Telephone Encounter (Signed)
ATC several times, busy signal. wcb

## 2015-10-25 NOTE — Progress Notes (Signed)
Daily Session Note  Patient Details  Name: [name] MRN: [id number] Date of Birth: 06-29-1950 Referring Provider:        CARDIAC REHAB PHASE II EXERCISE from 10/25/2015 in [place]   Referring Provider  Mertie Moores MD      Encounter Date: 10/25/2015  Check In:     Session Check In - 10/25/15 1159    Check-In   Location [facility]   Staff Present Seward Carol, MS, ACSM CEP, Exercise Physiologist;Joann Rion, RN, BSN;Amber Fair, MS, ACSM RCEP, Exercise Physiologist;Maria Venetia Maxon, RN, BSN   Supervising physician immediately available to respond to emergencies [facility] immediately available   Physician(s) Dr. Marily Memos    Medication changes reported     No   Fall or balance concerns reported    No   Warm-up and Cool-down Performed as group-led instruction   Resistance Training Performed No   VAD Patient? No   Pain Assessment   Currently in Pain? No/denies   Multiple Pain Sites No      Capillary Blood Glucose: No results found for this or any previous visit (from the past 24 hour(s)).   Goals Met:  Proper associated with RPD/PD & O2 Sat  Goals Unmet:  Shortness of breath on the treadmill  Comments: Pt started cardiac rehab today.  Pt exercised on the nustep and then reported feeling short of breath on the treadmill. Exercise stopped. Oxygen saturation 96% on room air. Upon assessment lung fields diminished in his bilateral posterior bases.Expiratory wheezes present in his left anterior posterior upper lobe. Mr Dinkins used his rescue inhaler which helped his symptoms. telemetry-v paced, .  Medication list reconciled. Pt denies barriers to medicaiton compliance.  PSYCHOSOCIAL ASSESSMENT:  PHQ-0. Pt exhibits positive coping skills, hopeful outlook with supportive family. No psychosocial needs identified at this time, no psychosocial interventions necessary.    Pt enjoys fishing.   Pt oriented to exercise  equipment and routine.    Understanding verbalized. Luane School FNP-c paged and notified about Mr Smartt's continued complaints about being short of breath. I also notified Mickel Baas that Mr Surber's weight is up 1.5 kg from orientation last Tuesday. Mickel Baas told Mr Ritson to increase his furosemide to 80 mg for two days. Mickel Baas also told Mr Halladay to take the prednisone dose pack as prescribed by Clemetine Marker NP with the patients wheezing.  I asked Mr Aboud to  hold off on returning to exercise for now. Will notify Dr Acie Fredrickson of today's events. Patient and wife state understanding. Exit blood pressure 132/60 HR 70. Sa02 97% on room air. Nichlos said he felt better upon exit from cardiac rehab.   Dr. Fransico Him is Medical Director for Cardiac Rehab at [facility]

## 2015-10-26 ENCOUNTER — Telehealth (HOSPITAL_COMMUNITY): Payer: Self-pay | Admitting: *Deleted

## 2015-10-26 NOTE — Telephone Encounter (Signed)
See previous note

## 2015-10-27 ENCOUNTER — Encounter (HOSPITAL_COMMUNITY)
Admission: RE | Admit: 2015-10-27 | Discharge: 2015-10-27 | Disposition: A | Discharge status: Home / Self Care | Payer: Medicare Other | Source: Ambulatory Visit | Attending: Cardiovascular Disease | Admitting: Cardiovascular Disease

## 2015-10-27 DIAGNOSIS — Z952 Presence of prosthetic heart valve: Secondary | ICD-10-CM

## 2015-10-30 ENCOUNTER — Encounter (HOSPITAL_COMMUNITY): Payer: Medicare Other

## 2015-11-01 ENCOUNTER — Encounter (HOSPITAL_COMMUNITY): Payer: Medicare Other

## 2015-11-02 ENCOUNTER — Encounter: Payer: Self-pay | Admitting: Cardiovascular Disease

## 2015-11-02 ENCOUNTER — Telehealth (HOSPITAL_COMMUNITY): Payer: Self-pay | Admitting: *Deleted

## 2015-11-02 ENCOUNTER — Ambulatory Visit (INDEPENDENT_AMBULATORY_CARE_PROVIDER_SITE_OTHER): Payer: Medicare Other | Admitting: Cardiovascular Disease

## 2015-11-02 VITALS — BP 138/58 | HR 69 | Ht 71.0 in | Wt 206.8 lb

## 2015-11-02 DIAGNOSIS — I482 Chronic atrial fibrillation, unspecified: Secondary | ICD-10-CM

## 2015-11-02 DIAGNOSIS — I350 Nonrheumatic aortic (valve) stenosis: Secondary | ICD-10-CM | POA: Diagnosis not present

## 2015-11-02 DIAGNOSIS — I272 Other secondary pulmonary hypertension: Secondary | ICD-10-CM | POA: Diagnosis not present

## 2015-11-02 NOTE — Patient Instructions (Addendum)
Medication Instructions:  Your physician recommends that you continue on your current medications as directed. Please refer to the Current Medication list given to you today.   Labwork: None Ordered   Testing/Procedures: None Ordered   Follow-Up: Your physician recommends that you return for a follow-up appointment on:  Friday August 18 at 1:30 pm   If you need a refill on your cardiac medications before your next appointment, please call your pharmacy.   Thank you for choosing [facility]! Christen Bame, RN [phone]

## 2015-11-02 NOTE — Progress Notes (Addendum)
Cardiology Office Note   Date:  11/02/2015   ID:  [name], DOB 1950/09/02, MRN [id number]  PCP:  Velna Hatchet, MD  Cardiologist:   Mertie Moores, MD   Chief Complaint  Patient presents with  . Follow-up   Problem List: 1. Aortic stenosis - s/p AVR + MAZE procedure   2.   Essential hypertension 3. Chest pain  - Normal cors by cath in 2014 4. Pulmonary Hypertension 5. Atrial fib - s/p Afib ablation ,  S/p pacer  6. Hyperlipidemia-  7. Fibromyalgia - does not sleep well.     History of Present Illness: [name] is a 65 y.o. male who presents for HTN and shortness of breath. I saw him many years ago .  He has been seeing Dr. Einar Gip in the interim.   His main complaint today is that he has severe DOE with any exercise.  Has severe DOE walking 10-20 feet.  Then he has severe nausea with vomitting.    No CP .  Watches his salt.    Has a poor appetite recently  has not lost much weight.  Has fibromyalgia - does not sleep well. Does not have sleep apnea.   Has had several sleep studies.    Has leg and foot swelling - especially at night.  Better in the am .   August 24, 2014:  [name] is doing better.   Still has some dyspnea with exertion.   We started Aldactone during the last visit.   His blood pressure is much better. He's able to walk further and he does not get at her breath quite as easily. He still becomes short of breath if he walks up a hill or walks too fast.  Aug. 29, 2016:  [name] is doing well. Is sleeping better .  Still has episodes of weakness  No syncopal episodes,  Has some dizziness.  No CP or dyspnea.   Did not go for the sleep study because he is sleeping   August 03, 2015:  [name] is seen back today for follow up of his pulmonary HTN and HTN He has been feeling a bit weak and dizzy.  BP is borderline low today   Recent echo shows normal LV function with severe AS He has minimal MR   Sep 18, 2015:  He had AVR on April 25. Not doing  As well as he would like. He is not sleeping well at all. He's had some acute worsening of his renal function. We've cut back his Lasix and we have held his valsartan.  Blood pressure still is a little bit low.  Breathing is better.   Using breathing treatments.   Was readmitted to the hospital with pleural effusion  - had a thoracentesis on May 8.     We cut his lasix to 40 mg a day  - still is having a decent urine output.   November 02, 2015:  Doing better. BP is stable. O2 sats = 98%  Still gets very short of breath with any exertion . Fatigues easily   Has had difficulty getting going in cardiac rehab. Has had severe fatigue   His echo performed after his AVR shows normal LV function, normal prosthetic AV function Improved pulmonary pressures ( from 50 mmhg to 38 mmHg)     Past Medical History  Diagnosis Date  . Hypertension   . Hyperlipidemia   . Fibromyalgia   . Obesity   . Mitral regurgitation  trivial  . Aortic stenosis, mild   . Left atrial enlargement     LA size 8mm by echo 11/21/11  . Pacemaker 03/31/2012  . Heart murmur   . Asthma     "little bit" (03/31/2012)  . Exertional dyspnea   . Hypothyroidism     S/P radiation  . DJD (degenerative joint disease)   . Arthritis of hand     "just a little bit in both hands" (03/31/2012)  . Atrial fibrillation [facility])     dx '04; DCCV '04, placed on flecainide, failed DCCV 04/2010, flecainide stopped; s/p successful A.fib ablation 01/31/12  . 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 Second degree AV block   . GERD (gastroesophageal reflux disease)   . Restless leg syndrome   . Obstructive sleep apnea     mild by sleep study 2013; pt stated he does not have a machine because "it wasn't bad enough for him to have one"  . Diabetes mellitus without complication ([place])     borderline    Past Surgical History  Procedure Laterality Date  . Finger tendon repair  1980's    "right little finger" (03/31/2012)  . US  echocardiography  08/07/2009    EF 55-60%  . Doppler echocardiography  03/11/2002    EF 70-75%  . Cardiovascular stress test  03/12/2002    EF 48%, NO EVIDENCE OF ISCHEMIA  . Tee without cardioversion  01/30/2012    Procedure: TRANSESOPHAGEAL ECHOCARDIOGRAM (TEE);  Surgeon: Larey Dresser, MD;  Location: [place];  Service: Cardiovascular;  Laterality: N/A;  ablation next day  . Pacemaker insertion  03/31/2012    STJ Accent DR pacemaker implanted by Dr Rayann Heman  . Atrial fibrillation ablation  01/30/2012    PVI by Dr. Rayann Heman  . Lumbar disc surgery  1980's X2;  2000's  . Cardioversion  01/2012; 03/31/2012  . Atrial fibrillation ablation N/A 01/31/2012    Procedure: ATRIAL FIBRILLATION ABLATION;  Surgeon: Thompson Grayer, MD;  Location: [facility] CATH LAB;  Service: Cardiovascular;  Laterality: N/A;  . Cardioversion N/A 02/25/2012    Procedure: CARDIOVERSION;  Surgeon: Thompson Grayer, MD;  Location: [facility] CATH LAB;  Service: Cardiovascular;  Laterality: N/A;  . Permanent pacemaker insertion N/A 03/31/2012    Procedure: PERMANENT PACEMAKER INSERTION;  Surgeon: Thompson Grayer, MD;  Location: [facility] CATH LAB;  Service: Cardiovascular;  Laterality: N/A;  . Cardioversion N/A 03/31/2012    Procedure: CARDIOVERSION;  Surgeon: Thompson Grayer, MD;  Location: [facility] CATH LAB;  Service: Cardiovascular;  Laterality: N/A;  . Left and right heart catheterization with coronary angiogram N/A 10/27/2012    Procedure: LEFT AND RIGHT HEART CATHETERIZATION WITH CORONARY ANGIOGRAM;  Surgeon: Laverda Page, MD;  Location: [facility] CATH LAB;  Service: Cardiovascular;  Laterality: N/A;  . Cardiac catheterization N/A 08/09/2015    Procedure: Right/Left Heart Cath and Coronary Angiography;  Surgeon: Peter M Martinique, MD;  Location: [place] CV LAB;  Service: Cardiovascular;  Laterality: N/A;  . Back surgery      X 3  . Finger surgery Left     Middle finger  . Insert / replace / remove pacemaker      St Jude  . Aortic valve replacement N/A  08/22/2015    Procedure: AORTIC VALVE REPLACEMENT (AVR);  Surgeon: Ivin Poot, MD;  Location: [place];  Service: Open Heart Surgery;  Laterality: N/A;  . Maze N/A 08/22/2015    Procedure: MAZE;  Surgeon: Ivin Poot, MD;  Location: [facility] OR;  Service: Open Heart Surgery;  Laterality: N/A;  . Clipping of atrial appendage N/A 08/22/2015    Procedure: CLIPPING OF ATRIAL APPENDAGE;  Surgeon: Ivin Poot, MD;  Location: [facility];  Service: Open Heart Surgery;  Laterality: N/A;  . Tee without cardioversion N/A 08/22/2015    Procedure: TRANSESOPHAGEAL ECHOCARDIOGRAM (TEE);  Surgeon: Ivin Poot, MD;  Location: [place];  Service: Open Heart Surgery;  Laterality: N/A;     Current Outpatient Prescriptions  Medication Sig Dispense Refill  . amLODipine (NORVASC) 10 MG tablet Take 0.5 tablets (5 mg total) by mouth daily. 30 tablet 11  . atorvastatin (LIPITOR) 40 MG tablet Take 1 tablet (40 mg total) by mouth daily. 90 tablet 1  . budesonide (PULMICORT) 0.25 MG/2ML nebulizer solution Take 2 mLs (0.25 mg total) by nebulization 2 (two) times daily. 360 mL 1  . Cholecalciferol (VITAMIN D) 2000 UNITS CAPS Take 2,000 Units by mouth daily.    . dabigatran (PRADAXA) 150 MG CAPS capsule Take 1 capsule (150 mg total) by mouth 2 (two) times daily. 60 capsule 1  . fexofenadine (ALLEGRA) 180 MG tablet Take 1 tablet (180 mg total) by mouth daily. 30 tablet 1  . fluticasone (FLONASE) 50 MCG/ACT nasal spray Place into both nostrils 2 (two) times daily as needed for allergies or rhinitis.    . furosemide (LASIX) 40 MG tablet Take 1 tablet (40 mg total) by mouth daily. 60 tablet 0  . gabapentin (NEURONTIN) 300 MG capsule Take 600 capsules by mouth at bedtime.   3  . ipratropium (ATROVENT) 0.02 % nebulizer solution Take 2.5 mLs (0.5 mg total) by nebulization 4 (four) times daily. 300 mL 1  . levalbuterol (XOPENEX) 0.63 MG/3ML nebulizer solution Take 3 mLs (0.63 mg total) by nebulization every 6 (six) hours as needed for  wheezing or shortness of breath. 360 mL 1  . levothyroxine (SYNTHROID, LEVOTHROID) 175 MCG tablet Take 175 mcg by mouth daily before breakfast.    . Melatonin-Pyridoxine (MELATONEX) 3-10 MG TBCR Take 1 tablet by mouth at bedtime.    . metoprolol tartrate (LOPRESSOR) 25 MG tablet Take 1 tablet (25 mg total) by mouth 2 (two) times daily. 60 tablet 3  . pantoprazole (PROTONIX) 40 MG tablet Take 40 mg by mouth daily.    . promethazine (PHENERGAN) 25 MG tablet Take 25 mg by mouth every 6 (six) hours as needed for nausea or vomiting.    [name] rOPINIRole (REQUIP) 1 MG tablet Take 1 tablet (1 mg total) by mouth daily. 90 tablet 2  . spironolactone (ALDACTONE) 25 MG tablet Take 12.5 mg by mouth daily.     . traMADol (ULTRAM) 50 MG tablet Take 1 tablet by mouth daily. Reported on 10/17/2015    . valACYclovir (VALTREX) 1000 MG tablet Take 1 tablet by mouth every 12 (twelve) hours as needed (fever blisters). Reported on 10/17/2015     No current facility-administered medications for this visit.    Allergies:   Meloxicam and Vancomycin    Social History:  The patient  reports that he quit smoking about 24 years ago. His smoking use included Cigarettes. He has a 7.5 pack-year smoking history. He has never used smokeless tobacco. He reports that he drinks alcohol. He reports that he uses illicit drugs (Marijuana).   Family History:  The patient's family history includes Heart failure in his mother; Stroke in his father.    ROS:  Please see the history of present illness.    Review  of Systems: Constitutional:  denies fever, chills, diaphoresis, appetite change and fatigue.  HEENT: denies photophobia, eye pain, redness, hearing loss, ear pain, congestion, sore throat, rhinorrhea, sneezing, neck pain, neck stiffness and tinnitus.  Respiratory: admits to SOB, DOE,    Cardiovascular: admits to   leg swelling.  Gastrointestinal: admits to nausea, vomiting, with walking   Genitourinary: denies dysuria, urgency,  frequency, hematuria, flank pain and difficulty urinating.  Musculoskeletal: denies  myalgias, back pain, joint swelling, arthralgias and gait problem.   Skin: denies pallor, rash and wound.  Neurological: denies dizziness, seizures, syncope, weakness, light-headedness, numbness and headaches.   Hematological: denies adenopathy, easy bruising, personal or family bleeding history.  Psychiatric/ Behavioral: denies suicidal ideation, mood changes, confusion, nervousness, sleep disturbance and agitation.       All other systems are reviewed and negative.    PHYSICAL EXAM: VS:  BP 138/58 mmHg  Pulse 69  Ht 5\' 11"  (1.803 m)  Wt 206 lb 12.8 oz (93.804 kg)  BMI 28.86 kg/m2  SpO2 98% , BMI Body mass index is 28.86 kg/(m^2). GEN: Well nourished, well developed, in no acute distress HEENT: normal Neck: no JVD, carotid bruits, or masses Cardiac: RRR; he has a 2/6 systolic murmur at the upper left sternal border. No , rubs, or gallops,no significant edema  Respiratory:  clear to auscultation bilaterally, normal work of breathing GI: soft, nontender, nondistended, + BS MS: no deformity or atrophy Skin: warm and dry, no rash Neuro:  Strength and sensation are intact Psych: normal   EKG:  EKG is ordered today. The ekg ordered today demonstrates :  Underlying atrial fib.   V pacing at 70   Recent Labs: 08/23/2015: Magnesium 2.4 09/02/2015: ALT 28; B Natriuretic Peptide 206.2* 09/04/2015: TSH 0.441 09/09/2015: Hemoglobin 9.7*; Platelets 342 09/18/2015: BUN 34*; Creat 1.64*; Potassium 4.0; Sodium 136    Lipid Panel    Component Value Date/Time   CHOL 114* 08/03/2015 1036   TRIG 111 08/03/2015 1036   HDL 30* 08/03/2015 1036   CHOLHDL 3.8 08/03/2015 1036   VLDL 22 08/03/2015 1036   LDLCALC 62 08/03/2015 1036      Wt Readings from Last 3 Encounters:  11/02/15 206 lb 12.8 oz (93.804 kg)  10/19/15 206 lb (93.441 kg)  10/17/15 202 lb 9.6 oz (91.9 kg)      Other studies  Reviewed: Additional studies/ records that were reviewed today include:  Review of the above records demonstrates:    ASSESSMENT AND PLAN:  1. Essential hypertension-    On amlodipine to 5 mg a day and continue  Aldactone to 12.5 a day   2. Aortic stenosis: s/p AVR , bioprosthetic valve.  Repeat echo looks good  He's been very fatigued since his surgery. Initially was very short of breath but this is largely resolved with thoracentesis. We had him on Lasix twice a day and now have been able to reduce into Lasix once a day. He's breathing remains good he remains very fatigued.  He has not been able to participate in cardiac  rehabilitation because of his fatigue. We ambulated him to laps around the office today. He did become fatigued but his O2 saturations remained good.  I think that he should be allowed to resume cardiac  rehabilitation and in fact I think this is exactly what will help him get better.  He'll just need to slow the treadmill down. He may need to stop on occasion to rest but then he should resume his activity. If  we don't push and enough, worried that he will become very deconditioned.  He is going to his general medical doctor to make of that he doesn't have an active pulmonary process.  3. Shortness of breath:   His breathing seems to improve. His O2 saturations remained stable. .  4. Pulmonary Hypertension- he has moderate Pulmonary HTN by cath - PA pressures in the 50.  His pulmonary artery pressures have improved to 38 mmHg following his surgery.  5. Atrial fib -has persistent atrial fibrillation by pacer interrogation. Continue Pradaxa.     6. Hyperlipidemia-  followed by his primary medical doctor  Current medicines are reviewed at length with the patient today.  The patient does not have concerns regarding medicines.  The following changes have been made:  no change  Labs/ tests ordered today include:  No orders of the defined types were placed in this  encounter.    Will see him in 6-8  weeks   , Mertie Moores, MD  11/02/2015 9:09 AM    [address], [place], [place]  [address] Phone: [phone]; Fax: [phone]

## 2015-11-03 ENCOUNTER — Encounter (HOSPITAL_COMMUNITY): Payer: Medicare Other

## 2015-11-06 ENCOUNTER — Ambulatory Visit (INDEPENDENT_AMBULATORY_CARE_PROVIDER_SITE_OTHER): Payer: Medicare Other | Admitting: Internal Medicine

## 2015-11-06 ENCOUNTER — Encounter: Payer: Self-pay | Admitting: Pulmonary Disease

## 2015-11-06 ENCOUNTER — Encounter: Payer: Self-pay | Admitting: Internal Medicine

## 2015-11-06 ENCOUNTER — Other Ambulatory Visit: Payer: Self-pay | Admitting: Cardiothoracic Surgery

## 2015-11-06 ENCOUNTER — Encounter (HOSPITAL_COMMUNITY): Payer: Medicare Other

## 2015-11-06 ENCOUNTER — Ambulatory Visit (INDEPENDENT_AMBULATORY_CARE_PROVIDER_SITE_OTHER): Payer: Medicare Other | Admitting: Pulmonary Disease

## 2015-11-06 ENCOUNTER — Telehealth: Payer: Self-pay | Admitting: Pulmonary Disease

## 2015-11-06 ENCOUNTER — Telehealth: Payer: Self-pay | Admitting: Emergency Medicine

## 2015-11-06 VITALS — BP 118/59 | HR 76 | Ht 71.0 in | Wt 205.2 lb

## 2015-11-06 VITALS — BP 150/70 | HR 82 | Ht 71.0 in | Wt 207.0 lb

## 2015-11-06 DIAGNOSIS — R5383 Other fatigue: Secondary | ICD-10-CM

## 2015-11-06 DIAGNOSIS — J189 Pneumonia, unspecified organism: Secondary | ICD-10-CM | POA: Diagnosis not present

## 2015-11-06 DIAGNOSIS — I5041 Acute combined systolic (congestive) and diastolic (congestive) heart failure: Secondary | ICD-10-CM

## 2015-11-06 DIAGNOSIS — J4520 Mild intermittent asthma, uncomplicated: Secondary | ICD-10-CM | POA: Diagnosis not present

## 2015-11-06 DIAGNOSIS — I482 Chronic atrial fibrillation, unspecified: Secondary | ICD-10-CM

## 2015-11-06 DIAGNOSIS — R188 Other ascites: Secondary | ICD-10-CM

## 2015-11-06 DIAGNOSIS — I441 Atrioventricular block, second degree: Secondary | ICD-10-CM

## 2015-11-06 DIAGNOSIS — Z954 Presence of other heart-valve replacement: Secondary | ICD-10-CM | POA: Diagnosis not present

## 2015-11-06 DIAGNOSIS — R0602 Shortness of breath: Secondary | ICD-10-CM

## 2015-11-06 DIAGNOSIS — Z952 Presence of prosthetic heart valve: Secondary | ICD-10-CM

## 2015-11-06 LAB — CUP PACEART INCLINIC DEVICE CHECK
Battery Remaining Longevity: 37.2
Battery Voltage: 2.89 V
Brady Statistic RA Percent Paced: 0 %
Brady Statistic RV Percent Paced: 99.49 %
Date Time Interrogation Session: 20170710135122
Implantable Lead Implant Date: 20131203
Implantable Lead Implant Date: 20131203
Implantable Lead Location: 753859
Implantable Lead Location: 753860
Implantable Lead Model: 1948
Lead Channel Impedance Value: 387.5 Ohm
Lead Channel Impedance Value: 612.5 Ohm
Lead Channel Pacing Threshold Amplitude: 1.25 V
Lead Channel Pacing Threshold Pulse Width: 0.8 ms
Lead Channel Sensing Intrinsic Amplitude: 0.8 mV
Lead Channel Sensing Intrinsic Amplitude: 6.8 mV
Lead Channel Setting Pacing Amplitude: 2 V
Lead Channel Setting Pacing Amplitude: 3.5 V
Lead Channel Setting Pacing Pulse Width: 0.8 ms
Lead Channel Setting Sensing Sensitivity: 2 mV
Pulse Gen Model: 2210
Pulse Gen Serial Number: 7408461

## 2015-11-06 MED ORDER — FUROSEMIDE 40 MG PO TABS: 40.0000 mg | ORAL_TABLET | Freq: Every day | ORAL | Status: DC

## 2015-11-06 NOTE — Assessment & Plan Note (Addendum)
Pt has chronic SOB but more SOB with wheezing x 5 weeks. He had valve surgery in 07/2015 and has not been at baseline since that time.  During the surgery, he was dxed with B pleural effusion as well as HCAP. He has been given zpak and pred taper since June 2017.  He is on diuretics and his weight has been the same.  He states he is not significantly better.  I think the SOB is multifactorial : 2/2 asthma as well as CHF/Fluid overload.  Its possible that he has incompletely treated PNA. My main consideration is fluid overload/pulm edema. His abd has been distended as well. He had AKI back in 08/2015 > not sure if this is worse as well.   Plan : 1. We need to get an urgent chest ct scan no contrast to better look at parenchyma. Plan for tomorrow. Will call pt once with ct scan.  2. Plan for [place] of abd.  I am not sure if he has ascites or liver dse which will better explain the SOB/fluid overload.  3. Will hold off on pred or abx pending chest ct scan. 4. Cont pulmicort > need to increase dose to 0.5 mg BID. Wife will check. Cont atrovent neb qid.  5. Cont diuretics.  6. Will need cbc, cmp, bnp. Coags.  7. Walk test today > pts O2 sats dropped lowest at 92%. 8. May need ABG on f/u if SOB is not better. 9. May also need sleep study to make sure pt does not have OSA making SOB worse.

## 2015-11-06 NOTE — Telephone Encounter (Addendum)
Pt wife calling requesting pt be seen today.  Pt wheezing and having SOB, was recommended by his pacemaker MD to be seen today.  Mucus production - feels the patient needs an antibiotic.  Pt unable to walk but a few feet without having SOB Pt unable to go back Pulm Rehab without clearance d/t increased breathing issues.   Dr Corrie Dandy has an opening at 3pm  Pt added on per [facility].  Pt okay with this appt. Nothing further needed.

## 2015-11-06 NOTE — Telephone Encounter (Signed)
   Pt will need chest ct scan on 7/11.  I forgot to order the ff blood work: CBC, CMP, BNP.  pls order. Thanks.   AD

## 2015-11-06 NOTE — Patient Instructions (Signed)
Your physician recommends that you continue on your current medications as directed. Please refer to the Current Medication list given to you today.  Remote monitoring is used to monitor your Pacemaker of ICD from home. This monitoring reduces the number of office visits required to check your device to one time per year. It allows [place] to keep an eye on the functioning of your device to ensure it is working properly. You are scheduled for a device check from home on 02/05/16. You may send your transmission at any time that day. If you have a wireless device, the transmission will be sent automatically. After your physician reviews your transmission, you will receive a postcard with your next transmission date.  Your physician wants you to follow-up in:  [place] DR[address] ALLRED.  You will receive a reminder letter in the mail two months in advance. If you don't receive a letter, please call our office to schedule the follow-up appointment.

## 2015-11-06 NOTE — Assessment & Plan Note (Signed)
Cont pulmicort neb BID >make sure its 0.5 mg BID. Cont atrovent QID. Avoiding SABA 2/2 afib. S/P MAZE.

## 2015-11-06 NOTE — Patient Instructions (Addendum)
It was a pleasure taking care of you today!   You are diagnosed with ASTHMA.    Make sure you use your medications for COPD -- Maintenance medications : Pulmicort via nebulizer 1 puff twice a day. Atrovent via neb 4x/day  Continue using other medicines: Allegra, Flonase. Omeprazole.   We will plan on getting a chest CT scan and get abdomen/pelvis ultrasound in the morning.  Please call us back regarding the dosages of medicines for the nebulizer.   Return to clinic in as scheduled with DR. BYRUM, We will call you with results.

## 2015-11-06 NOTE — Progress Notes (Signed)
Electrophysiology Office Note   Date:  11/06/2015   ID:  [name], DOB March 12, 1951, MRN [id number]  PCP:  Velna Hatchet, MD  Cardiologist:  Dr Acie Fredrickson Primary Electrophysiologist: Thompson Grayer, MD    Chief Complaint  Patient presents with  . Atrial Fibrillation     History of Present Illness: [name] is a 65 y.o. male who presents today for electrophysiology evaluation.   Has chronic SOB and edema.  He is s/p aortic valve replacement with LAA clip and MAZE 4/17.  He was in sinus for a week but quickly returned to afib.  He has not had subsequent cardioversion.  His primary concern remains with chronic SOB.  Today, he denies symptoms of palpitations, chest pain,  orthopnea, PND, claudication, dizziness, presyncope, syncope, bleeding, or neurologic sequela. The patient is tolerating medications without difficulties and is otherwise without complaint today.    Past Medical History  Diagnosis Date  . Hypertension   . Hyperlipidemia   . Fibromyalgia   . Obesity   . Mitral regurgitation     trivial  . Aortic stenosis, mild   . Left atrial enlargement     LA size 20mm by echo 11/21/11  . Pacemaker 03/31/2012  . Heart murmur   . Asthma     "little bit" (03/31/2012)  . Exertional dyspnea   . Hypothyroidism     S/P radiation  . DJD (degenerative joint disease)   . Arthritis of hand     "just a little bit in both hands" (03/31/2012)  . Atrial fibrillation [facility])     dx '04; DCCV '04, placed on flecainide, failed DCCV 04/2010, flecainide stopped; s/p successful A.fib ablation 01/31/12  . First degree AV block     post ablation, ~443ms  . Second degree AV block   . GERD (gastroesophageal reflux disease)   . Restless leg syndrome   . Obstructive sleep apnea     mild by sleep study 2013; pt stated he does not have a machine because "it wasn't bad enough for him to have one"  . Diabetes mellitus without complication ([place])     borderline   Past Surgical History    Procedure Laterality Date  . Finger tendon repair  1980's    "right little finger" (03/31/2012)  . US echocardiography  08/07/2009    EF 55-60%  . Doppler echocardiography  03/11/2002    EF 70-75%  . Cardiovascular stress test  03/12/2002    EF 48%, NO EVIDENCE OF ISCHEMIA  . Tee without cardioversion  01/30/2012    Procedure: TRANSESOPHAGEAL ECHOCARDIOGRAM (TEE);  Surgeon: Larey Dresser, MD;  Location: [place];  Service: Cardiovascular;  Laterality: N/A;  ablation next day  . Pacemaker insertion  03/31/2012    STJ Accent DR pacemaker implanted by Dr Rayann Heman  . Atrial fibrillation ablation  01/30/2012    PVI by Dr. Rayann Heman  . Lumbar disc surgery  1980's X2;  2000's  . Cardioversion  01/2012; 03/31/2012  . Atrial fibrillation ablation N/A 01/31/2012    Procedure: ATRIAL FIBRILLATION ABLATION;  Surgeon: Thompson Grayer, MD;  Location: [facility] CATH LAB;  Service: Cardiovascular;  Laterality: N/A;  . Cardioversion N/A 02/25/2012    Procedure: CARDIOVERSION;  Surgeon: Thompson Grayer, MD;  Location: [facility] CATH LAB;  Service: Cardiovascular;  Laterality: N/A;  . Permanent pacemaker insertion N/A 03/31/2012    Procedure: PERMANENT PACEMAKER INSERTION;  Surgeon: Thompson Grayer, MD;  Location: [facility] CATH LAB;  Service: Cardiovascular;  Laterality: N/A;  . Cardioversion  N/A 03/31/2012    Procedure: CARDIOVERSION;  Surgeon: Thompson Grayer, MD;  Location: [facility] CATH LAB;  Service: Cardiovascular;  Laterality: N/A;  . Left and right heart catheterization with coronary angiogram N/A 10/27/2012    Procedure: LEFT AND RIGHT HEART CATHETERIZATION WITH CORONARY ANGIOGRAM;  Surgeon: Laverda Page, MD;  Location: [facility] CATH LAB;  Service: Cardiovascular;  Laterality: N/A;  . Cardiac catheterization N/A 08/09/2015    Procedure: Right/Left Heart Cath and Coronary Angiography;  Surgeon: Peter M Martinique, MD;  Location: [place] CV LAB;  Service: Cardiovascular;  Laterality: N/A;  . Back surgery      X 3  . Finger surgery Left      Middle finger  . Insert / replace / remove pacemaker      St Jude  . Aortic valve replacement N/A 08/22/2015    Procedure: AORTIC VALVE REPLACEMENT (AVR);  Surgeon: Ivin Poot, MD;  Location: [place];  Service: Open Heart Surgery;  Laterality: N/A;  . Maze N/A 08/22/2015    Procedure: MAZE;  Surgeon: Ivin Poot, MD;  Location: [place];  Service: Open Heart Surgery;  Laterality: N/A;  . Clipping of atrial appendage N/A 08/22/2015    Procedure: CLIPPING OF ATRIAL APPENDAGE;  Surgeon: Ivin Poot, MD;  Location: [place];  Service: Open Heart Surgery;  Laterality: N/A;  . Tee without cardioversion N/A 08/22/2015    Procedure: TRANSESOPHAGEAL ECHOCARDIOGRAM (TEE);  Surgeon: Ivin Poot, MD;  Location: [place];  Service: Open Heart Surgery;  Laterality: N/A;     Current Outpatient Prescriptions  Medication Sig Dispense Refill  . amLODipine (NORVASC) 10 MG tablet Take 0.5 tablets (5 mg total) by mouth daily. 30 tablet 11  . atorvastatin (LIPITOR) 40 MG tablet Take 1 tablet (40 mg total) by mouth daily. 90 tablet 1  . budesonide (PULMICORT) 0.25 MG/2ML nebulizer solution Take 2 mLs (0.25 mg total) by nebulization 2 (two) times daily. 360 mL 1  . Cholecalciferol (VITAMIN D) 2000 UNITS CAPS Take 2,000 Units by mouth daily.    . dabigatran (PRADAXA) 150 MG CAPS capsule Take 1 capsule (150 mg total) by mouth 2 (two) times daily. 60 capsule 1  . fexofenadine (ALLEGRA) 180 MG tablet Take 1 tablet (180 mg total) by mouth daily. 30 tablet 1  . fluticasone (FLONASE) 50 MCG/ACT nasal spray Place into both nostrils 2 (two) times daily as needed for allergies or rhinitis.    . furosemide (LASIX) 40 MG tablet Take 1-2 tablets (40-80 mg total) by mouth daily. 120 tablet 3  . gabapentin (NEURONTIN) 300 MG capsule Take 600 mg by mouth at bedtime.   3  . ipratropium (ATROVENT) 0.02 % nebulizer solution Take 2.5 mLs (0.5 mg total) by nebulization 4 (four) times daily. 300 mL 1  . levalbuterol (XOPENEX) 0.63  MG/3ML nebulizer solution Take 3 mLs (0.63 mg total) by nebulization every 6 (six) hours as needed for wheezing or shortness of breath. 360 mL 1  . levothyroxine (SYNTHROID, LEVOTHROID) 175 MCG tablet Take 175 mcg by mouth daily before breakfast.    . Melatonin-Pyridoxine (MELATONEX) 3-10 MG TBCR Take 1 tablet by mouth at bedtime.    . metoprolol tartrate (LOPRESSOR) 25 MG tablet Take 1 tablet (25 mg total) by mouth 2 (two) times daily. 60 tablet 3  . pantoprazole (PROTONIX) 40 MG tablet Take 40 mg by mouth daily.    . promethazine (PHENERGAN) 25 MG tablet Take 25 mg by mouth every 6 (six) hours as needed for  nausea or vomiting.    [name] rOPINIRole (REQUIP) 1 MG tablet Take 1 tablet (1 mg total) by mouth daily. 90 tablet 2  . spironolactone (ALDACTONE) 25 MG tablet Take 12.5 mg by mouth daily.     . traMADol (ULTRAM) 50 MG tablet Take 1 tablet by mouth daily. Reported on 10/17/2015    . valACYclovir (VALTREX) 1000 MG tablet Take 1 tablet by mouth every 12 (twelve) hours as needed (fever blisters). Reported on 10/17/2015     No current facility-administered medications for this visit.    Allergies:   Meloxicam and Vancomycin   Social History:  The patient  reports that he quit smoking about 24 years ago. His smoking use included Cigarettes. He has a 7.5 pack-year smoking history. He has never used smokeless tobacco. He reports that he drinks alcohol. He reports that he uses illicit drugs (Marijuana).   Family History:  The patient's family history includes Heart failure in his mother; Stroke in his father.    ROS:  Please see the history of present illness.   All other systems are reviewed and negative.    PHYSICAL EXAM: VS:  BP 118/59 mmHg  Pulse 76  Ht 5\' 11"  (1.803 m)  Wt 205 lb 3.2 oz (93.078 kg)  BMI 28.63 kg/m2  SpO2 97% , BMI Body mass index is 28.63 kg/(m^2). GEN: Well nourished, well developed, in no acute distress HEENT: normal Neck: no JVD, carotid bruits, or masses Cardiac: RRR  (paced)  Respiratory:  Coarse BS at bases, diffuse expiratory wheezes, normal work of breathing GI: soft, nontender, nondistended, + BS MS: no deformity or atrophy Skin: warm and dry, device pocket is well healed Neuro:  Strength and sensation are intact Psych: euthymic mood, full affect  Device interrogation is reviewed today in detail.  See PaceArt for details.   Recent Labs: 08/23/2015: Magnesium 2.4 09/02/2015: ALT 28; B Natriuretic Peptide 206.2* 09/04/2015: TSH 0.441 09/09/2015: Hemoglobin 9.7*; Platelets 342 09/18/2015: BUN 34*; Creat 1.64*; Potassium 4.0; Sodium 136    Lipid Panel     Component Value Date/Time   CHOL 114* 08/03/2015 1036   TRIG 111 08/03/2015 1036   HDL 30* 08/03/2015 1036   CHOLHDL 3.8 08/03/2015 1036   VLDL 22 08/03/2015 1036   LDLCALC 62 08/03/2015 1036     Wt Readings from Last 3 Encounters:  11/06/15 205 lb 3.2 oz (93.078 kg)  11/02/15 206 lb 12.8 oz (93.804 kg)  10/19/15 206 lb (93.441 kg)      Other studies Reviewed: Additional studies/ records that were reviewed today include: Dr Acie Fredrickson notes , OP notes   ASSESSMENT AND PLAN:   1. Second degree AV block Normal pacemaker function See Pace Art report  2. SOB Quite wheezy on exam, would likely benefit from pulmonary follow-up  3. afib Remains on pradaxa S/p recent MAZE and LAA clipping Given now 3 months post MAZE, it is probably reasonable to reconsider cardioversion.  I will forward note to Dr Prescott Gum to see if he would like for [place] to proceed.  4. htn Stable  Return in 1 year for follow-up Follow-up with Dr Acie Fredrickson as scheduled  [place]   Current medicines are reviewed at length with the patient today.   The patient does not have concerns regarding his medicines.  The following changes were made today:  none  Signed, Thompson Grayer, MD  11/06/2015 12:26 PM     [place] [id number] [phone] (office) [phone]  (  fax)

## 2015-11-06 NOTE — Progress Notes (Signed)
Subjective:    Patient ID: [name], male    DOB: 04/05/1951, 65 y.o.   MRN: [id number]  HPI  [name] is a 65 year old with past medical history of asthma and CAD. Quit smoking in 1993.  He is here for urgent follow up.  He had valve surgery in April 2017. OR went well but had SOB shortly after and they found fluid in lungs. Was diuresced and got better. Not back at baseline though.  He has been SOB x 5 weeks. Got ZPAK and Pred end of June. He finished his prednisone a week ago.  Over all, he feels some better but is still SOB with ADLs, not as bad as 5 weeks ago. He feels prednsione did NOT make a whole lot of difference.  Not on O2.  Has snoring. No recent sleep study.  His weight has been the same.  He saw cardiologist today and was told he has wheezing and needed pulm f/u.    Review of Systems  Constitutional: Negative.   HENT: Positive for congestion.   Respiratory: Positive for cough, shortness of breath and wheezing. Negative for chest tightness.        Has green sputum x 5 weeks, on and off.   Cardiovascular: Negative.   Gastrointestinal: Negative.   Endocrine: Negative.   Genitourinary: Negative.   Musculoskeletal: Negative.   Allergic/Immunologic: Negative.   Hematological: Negative.   All other systems reviewed and are negative.        Past Medical History  Diagnosis Date  . Hypertension   . Hyperlipidemia   . Fibromyalgia   . Obesity   . Mitral regurgitation     trivial  . Aortic stenosis, mild   . Left atrial enlargement     LA size 34mm by echo 11/21/11  . Pacemaker 03/31/2012  . Heart murmur   . Asthma     "little bit" (03/31/2012)  . Exertional dyspnea   . Hypothyroidism     S/P radiation  . DJD (degenerative joint disease)   . Arthritis of hand     "just a little bit in both hands" (03/31/2012)  . Atrial fibrillation [facility])     dx '04; DCCV '04, placed on flecainide, failed DCCV 04/2010, flecainide stopped; s/p successful A.fib  ablation 01/31/12  . First degree AV block     post ablation, ~463ms  . Second degree AV block   . GERD (gastroesophageal reflux disease)   . Restless leg syndrome   . Obstructive sleep apnea     mild by sleep study 2013; pt stated he does not have a machine because "it wasn't bad enough for him to have one"  . Diabetes mellitus without complication ([place])     borderline     Family History  Problem Relation Age of Onset  . Heart failure Mother   . Stroke Father      Past Surgical History  Procedure Laterality Date  . Finger tendon repair  1980's    "right little finger" (03/31/2012)  . US echocardiography  08/07/2009    EF 55-60%  . Doppler echocardiography  03/11/2002    EF 70-75%  . Cardiovascular stress test  03/12/2002    EF 48%, NO EVIDENCE OF ISCHEMIA  . Tee without cardioversion  01/30/2012    Procedure: TRANSESOPHAGEAL ECHOCARDIOGRAM (TEE);  Surgeon: Larey Dresser, MD;  Location: [place];  Service: Cardiovascular;  Laterality: N/A;  ablation next day  . Pacemaker insertion  03/31/2012  STJ Accent DR pacemaker implanted by Dr Rayann Heman  . Atrial fibrillation ablation  01/30/2012    PVI by Dr. Rayann Heman  . Lumbar disc surgery  1980's X2;  2000's  . Cardioversion  01/2012; 03/31/2012  . Atrial fibrillation ablation N/A 01/31/2012    Procedure: ATRIAL FIBRILLATION ABLATION;  Surgeon: Thompson Grayer, MD;  Location: [facility] CATH LAB;  Service: Cardiovascular;  Laterality: N/A;  . Cardioversion N/A 02/25/2012    Procedure: CARDIOVERSION;  Surgeon: Thompson Grayer, MD;  Location: [facility] CATH LAB;  Service: Cardiovascular;  Laterality: N/A;  . Permanent pacemaker insertion N/A 03/31/2012    Procedure: PERMANENT PACEMAKER INSERTION;  Surgeon: Thompson Grayer, MD;  Location: [facility] CATH LAB;  Service: Cardiovascular;  Laterality: N/A;  . Cardioversion N/A 03/31/2012    Procedure: CARDIOVERSION;  Surgeon: Thompson Grayer, MD;  Location: [facility] CATH LAB;  Service: Cardiovascular;  Laterality: N/A;  . Left and  right heart catheterization with coronary angiogram N/A 10/27/2012    Procedure: LEFT AND RIGHT HEART CATHETERIZATION WITH CORONARY ANGIOGRAM;  Surgeon: Laverda Page, MD;  Location: [facility] CATH LAB;  Service: Cardiovascular;  Laterality: N/A;  . Cardiac catheterization N/A 08/09/2015    Procedure: Right/Left Heart Cath and Coronary Angiography;  Surgeon: Peter M Martinique, MD;  Location: [place] CV LAB;  Service: Cardiovascular;  Laterality: N/A;  . Back surgery      X 3  . Finger surgery Left     Middle finger  . Insert / replace / remove pacemaker      St Jude  . Aortic valve replacement N/A 08/22/2015    Procedure: AORTIC VALVE REPLACEMENT (AVR);  Surgeon: Ivin Poot, MD;  Location: [place];  Service: Open Heart Surgery;  Laterality: N/A;  . Maze N/A 08/22/2015    Procedure: MAZE;  Surgeon: Ivin Poot, MD;  Location: [place];  Service: Open Heart Surgery;  Laterality: N/A;  . Clipping of atrial appendage N/A 08/22/2015    Procedure: CLIPPING OF ATRIAL APPENDAGE;  Surgeon: Ivin Poot, MD;  Location: [place];  Service: Open Heart Surgery;  Laterality: N/A;  . Tee without cardioversion N/A 08/22/2015    Procedure: TRANSESOPHAGEAL ECHOCARDIOGRAM (TEE);  Surgeon: Ivin Poot, MD;  Location: [place];  Service: Open Heart Surgery;  Laterality: N/A;    Social History   Social History  . Marital Status: Married    Spouse Name: N/A  . Number of Children: N/A  . Years of Education: N/A   Occupational History  . Not on file.   Social History Main Topics  . Smoking status: Former Smoker -- 0.50 packs/day for 15 years    Types: Cigarettes    Quit date: 07/18/1991  . Smokeless tobacco: Never Used  . Alcohol Use: Yes     Comment: occasional  . Drug Use: Yes    Special: Marijuana  . Sexual Activity: Not Currently   Other Topics Concern  . Not on file   Social History Narrative   Lives in [place] with spouse.  Works as a Air traffic controller.     Allergies  Allergen  Reactions  . Meloxicam Rash  . 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 syndrome 09/02/15, resolved with diphenhydramine and slowing of rate     Outpatient Prescriptions Prior to Visit  Medication Sig Dispense Refill  . amLODipine (NORVASC) 10 MG tablet Take 0.5 tablets (5 mg total) by mouth daily. 30 tablet 11  . atorvastatin (LIPITOR) 40 MG tablet Take 1 tablet (40 mg  total) by mouth daily. 90 tablet 1  . budesonide (PULMICORT) 0.25 MG/2ML nebulizer solution Take 2 mLs (0.25 mg total) by nebulization 2 (two) times daily. 360 mL 1  . Cholecalciferol (VITAMIN D) 2000 UNITS CAPS Take 2,000 Units by mouth daily.    . dabigatran (PRADAXA) 150 MG CAPS capsule Take 1 capsule (150 mg total) by mouth 2 (two) times daily. 60 capsule 1  . fexofenadine (ALLEGRA) 180 MG tablet Take 1 tablet (180 mg total) by mouth daily. 30 tablet 1  . fluticasone (FLONASE) 50 MCG/ACT nasal spray Place into both nostrils 2 (two) times daily as needed for allergies or rhinitis.    . furosemide (LASIX) 40 MG tablet Take 1-2 tablets (40-80 mg total) by mouth daily. 120 tablet 3  . gabapentin (NEURONTIN) 300 MG capsule Take 600 mg by mouth at bedtime.   3  . ipratropium (ATROVENT) 0.02 % nebulizer solution Take 2.5 mLs (0.5 mg total) by nebulization 4 (four) times daily. 300 mL 1  . levalbuterol (XOPENEX) 0.63 MG/3ML nebulizer solution Take 3 mLs (0.63 mg total) by nebulization every 6 (six) hours as needed for wheezing or shortness of breath. 360 mL 1  . levothyroxine (SYNTHROID, LEVOTHROID) 175 MCG tablet Take 175 mcg by mouth daily before breakfast.    . Melatonin-Pyridoxine (MELATONEX) 3-10 MG TBCR Take 1 tablet by mouth at bedtime.    . metoprolol tartrate (LOPRESSOR) 25 MG tablet Take 1 tablet (25 mg total) by mouth 2 (two) times daily. 60 tablet 3  . pantoprazole (PROTONIX) 40 MG tablet Take 40 mg by mouth daily.    . promethazine (PHENERGAN) 25 MG tablet Take 25 mg by mouth every 6 (six) hours as needed for nausea  or vomiting.    [name] rOPINIRole (REQUIP) 1 MG tablet Take 1 tablet (1 mg total) by mouth daily. 90 tablet 2  . spironolactone (ALDACTONE) 25 MG tablet Take 12.5 mg by mouth daily.     . traMADol (ULTRAM) 50 MG tablet Take 1 tablet by mouth daily. Reported on 10/17/2015    . valACYclovir (VALTREX) 1000 MG tablet Take 1 tablet by mouth every 12 (twelve) hours as needed (fever blisters). Reported on 10/17/2015     No facility-administered medications prior to visit.   No orders of the defined types were placed in this encounter.       Objective:   Physical Exam  Vitals:  Filed Vitals:   11/06/15 1446  BP: 150/70  Pulse: 82  Height: 5\' 11"  (1.803 m)  Weight: 207 lb (93.895 kg)  SpO2: 96%    Constitutional/General:  Pleasant, well-nourished, well-developed, not in any distress,  Comfortably seating.  Well kempt  Body mass index is 28.88 kg/(m^2). Wt Readings from Last 3 Encounters:  11/06/15 207 lb (93.895 kg)  11/06/15 205 lb 3.2 oz (93.078 kg)  11/02/15 206 lb 12.8 oz (93.804 kg)    HEENT: Pupils equal and reactive to light and accommodation. Anicteric sclerae. Normal nasal mucosa.   No oral  lesions,  mouth clear,  oropharynx clear, no postnasal drip. (-) Oral thrush. No dental caries.  Airway - Mallampati class III  Neck: No masses. Midline trachea. No JVD, (-) LAD. (-) bruits appreciated.  Respiratory/Chest: Grossly normal chest. (-) deformity. (-) Accessory muscle use.  Symmetric expansion. (-) Tenderness on palpation.  Resonant on percussion.  Diminished BS on both lower lung zones. (-) rhonchi. Wheezing Bilaterally, more posteriorly. Some crackles at bases.  (-) egophony  Cardiovascular: Regular rate and  rhythm, heart  sounds normal, no murmur or gallops, trace edema  Gastrointestinal:  Normal bowel sounds. Soft, non-tender. No hepatosplenomegaly.  (-) masses. Distended abdomen. Seems like he has fluid overload/ascites.   Musculoskeletal:  Normal muscle tone.  Normal gait.   Extremities: Grossly normal. (-) clubbing, cyanosis.  Trace  edema  Skin: (-) rash,lesions seen.   Neurological/Psychiatric : alert, oriented to time, place, person. Normal mood and affect            Assessment & Plan:  Shortness of breath Pt has chronic SOB but more SOB with wheezing x 5 weeks. He had valve surgery in 07/2015 and has not been at baseline since that time.  During the surgery, he was dxed with B pleural effusion as well as HCAP. He has been given zpak and pred taper since June 2017.  He is on diuretics and his weight has been the same.  He states he is not significantly better.  I think the SOB is multifactorial : 2/2 asthma as well as CHF/Fluid overload.  Its possible that he has incompletely treated PNA. My main consideration is fluid overload/pulm edema. His abd has been distended as well. He had AKI back in 08/2015 > not sure if this is worse as well.   Plan : 1. We need to get an urgent chest ct scan no contrast to better look at parenchyma. Plan for tomorrow. Will call pt once with ct scan.  2. Plan for [place] of abd.  I am not sure if he has ascites or liver dse which will better explain the SOB/fluid overload.  3. Will hold off on pred or abx pending chest ct scan. 4. Cont pulmicort > need to increase dose to 0.5 mg BID. Wife will check. Cont atrovent neb qid.  5. Cont diuretics.  6. Will need cbc, cmp, bnp. Coags.  7. Walk test today > pts O2 sats dropped lowest at 92%. 8. May need ABG on f/u if SOB is not better. 9. May also need sleep study to make sure pt does not have OSA making SOB worse.   Intrinsic asthma Cont pulmicort neb BID >make sure its 0.5 mg BID. Cont atrovent QID. Avoiding SABA 2/2 afib. S/P MAZE.   Fatigue May be 2/2 SOB/asthma/CHF/AKI/CKD. R/O OSA. May need sleep study on f/u.   I spent at least 25 minutes with the patient today and more than 50% was spent counseling the patient regarding disease and management and  facilitating labs and medications.        Monica Becton, MD 11/06/2015, 8:15 PM [facility] Pager [phone] After 3 pm or if no answer, call [phone]

## 2015-11-06 NOTE — Assessment & Plan Note (Signed)
May be 2/2 SOB/asthma/CHF/AKI/CKD. R/O OSA. May need sleep study on f/u.

## 2015-11-07 ENCOUNTER — Other Ambulatory Visit (INDEPENDENT_AMBULATORY_CARE_PROVIDER_SITE_OTHER): Payer: Medicare Other

## 2015-11-07 ENCOUNTER — Ambulatory Visit (INDEPENDENT_AMBULATORY_CARE_PROVIDER_SITE_OTHER)
Admission: RE | Admit: 2015-11-07 | Discharge: 2015-11-07 | Disposition: A | Discharge status: Home / Self Care | Payer: Medicare Other | Source: Ambulatory Visit | Attending: Pulmonary Disease | Admitting: Pulmonary Disease

## 2015-11-07 ENCOUNTER — Telehealth: Payer: Self-pay | Admitting: Pulmonary Disease

## 2015-11-07 ENCOUNTER — Telehealth: Payer: Self-pay | Admitting: Cardiovascular Disease

## 2015-11-07 ENCOUNTER — Ambulatory Visit (HOSPITAL_COMMUNITY)
Admission: RE | Admit: 2015-11-07 | Discharge: 2015-11-07 | Disposition: A | Discharge status: Home / Self Care | Payer: Medicare Other | Source: Ambulatory Visit | Attending: Pulmonary Disease | Admitting: Pulmonary Disease

## 2015-11-07 ENCOUNTER — Other Ambulatory Visit: Payer: Self-pay | Admitting: *Deleted

## 2015-11-07 DIAGNOSIS — J189 Pneumonia, unspecified organism: Secondary | ICD-10-CM

## 2015-11-07 DIAGNOSIS — K8020 Calculus of gallbladder without cholecystitis without obstruction: Secondary | ICD-10-CM | POA: Diagnosis not present

## 2015-11-07 DIAGNOSIS — K824 Cholesterolosis of gallbladder: Secondary | ICD-10-CM | POA: Insufficient documentation

## 2015-11-07 DIAGNOSIS — R0602 Shortness of breath: Secondary | ICD-10-CM | POA: Diagnosis not present

## 2015-11-07 DIAGNOSIS — R188 Other ascites: Secondary | ICD-10-CM

## 2015-11-07 DIAGNOSIS — R161 Splenomegaly, not elsewhere classified: Secondary | ICD-10-CM | POA: Insufficient documentation

## 2015-11-07 DIAGNOSIS — I4819 Other persistent atrial fibrillation: Secondary | ICD-10-CM

## 2015-11-07 LAB — CBC WITH DIFFERENTIAL/PLATELET
Basophils Absolute: 0 10*3/uL (ref 0.0–0.1)
Basophils Relative: 0.3 % (ref 0.0–3.0)
Eosinophils Absolute: 0.1 10*3/uL (ref 0.0–0.7)
Eosinophils Relative: 1 % (ref 0.0–5.0)
HCT: 31.2 % — ABNORMAL LOW (ref 39.0–52.0)
Hemoglobin: 9.9 g/dL — ABNORMAL LOW (ref 13.0–17.0)
Lymphocytes Relative: 8.1 % — ABNORMAL LOW (ref 12.0–46.0)
Lymphs Abs: 1.1 10*3/uL (ref 0.7–4.0)
MCHC: 31.7 g/dL (ref 30.0–36.0)
MCV: 80.9 fl (ref 78.0–100.0)
Monocytes Absolute: 0.9 10*3/uL (ref 0.1–1.0)
Monocytes Relative: 6.7 % (ref 3.0–12.0)
Neutro Abs: 11.8 10*3/uL — ABNORMAL HIGH (ref 1.4–7.7)
Neutrophils Relative %: 83.9 % — ABNORMAL HIGH (ref 43.0–77.0)
Platelets: 229 10*3/uL (ref 150.0–400.0)
RBC: 3.86 Mil/uL — ABNORMAL LOW (ref 4.22–5.81)
RDW: 20 % — ABNORMAL HIGH (ref 11.5–15.5)
WBC: 14 10*3/uL — ABNORMAL HIGH (ref 4.0–10.5)

## 2015-11-07 LAB — PROTIME-INR
INR: 1.5 ratio — ABNORMAL HIGH (ref 0.8–1.0)
Prothrombin Time: 16.2 s — ABNORMAL HIGH (ref 9.6–13.1)

## 2015-11-07 LAB — APTT: aPTT: 56.7 s — ABNORMAL HIGH (ref 23.4–32.7)

## 2015-11-07 LAB — BRAIN NATRIURETIC PEPTIDE: Pro B Natriuretic peptide (BNP): 178 pg/mL — ABNORMAL HIGH (ref 0.0–100.0)

## 2015-11-07 NOTE — Telephone Encounter (Signed)
   I saw this pt on 7/11 as an urgent follow up. He remains SOB, 5 weeks now.  Has wheezing. Recent CXR I thought had vasc congestion.  I did not think his asthma was flaring up.   Chest CT scan today shows vascular congestion and pulmonary edema > similar to May 2017 when he was admitted after cardiac surgery. B pleural effusion are smaller.    He is being seen by Dr. Acie Fredrickson and Dr. Rayann Heman.   I updated wife and mentioned that he might need higher diuretics.   Will order cbc,bnp,bmp today. May 2017 creat was elevated.   Pls let me know if you don't think he has pulmonary edema.  Wife will also call cardiology office to update.  Just and FYI.  Thank you.    Monica Becton, MD 11/07/2015, 10:40 AM [facility] Pager [phone] After 3 pm or if no answer, call [phone]

## 2015-11-07 NOTE — Telephone Encounter (Signed)
New Message   pt wife call requesting to speak with RN about CT scan performed by pul MD. Pt wife would like Dr. Acie Fredrickson to look over the scan and give her a call to discuss. Please call back

## 2015-11-07 NOTE — Telephone Encounter (Signed)
Spoke with pt's wife and she states she would like for Dr. Acie Fredrickson to review the CT scan that pulmonology had done today and see if he had any recommendations. Wife states pulmonologist advised her to direct questions to Dr. Acie Fredrickson due to pt having some extra fluid and SOB. Advised wife that I will send message to Dr. Acie Fredrickson to see if he has any further recommendations. Wife also plans to speak with Dr. Prescott Gum tomorrow at pt's appt.

## 2015-11-07 NOTE — Telephone Encounter (Signed)
LM for pt wife

## 2015-11-07 NOTE — Telephone Encounter (Signed)
pts wife is calling to say they finished the testing this morning, and the pt is taking budesonide .25mg . Says the doctor wanted them to remind him to check the results right away.

## 2015-11-07 NOTE — Telephone Encounter (Signed)
The CT shows some pulmonary edema ( likely mild ) Have him double his lasix and potassium for the next 3 days to see if that helps

## 2015-11-07 NOTE — Telephone Encounter (Signed)
I saw Bo[place] recently . His O2 sats in the office were normal at rest and after walking 600 feet ( he became fatigued but not short of breath ) We tried Lasix bid for a while but it appeared that he developed some worsening of his renal function ( although I think we was also an an ARB)   The CT scan from today shows a small - moderate right pleural effusion .  I didn't think that is shows significant pulmonary edema but would await final reading from radiology .  The echo from May 7 shows normal prosthetic AV function ,  No significant abnormalities.  He could try BID lasix dosing for 3 days and see if that helps.   I think most of this is post surgical and will take some time to recover   Thanks   Abbe Amsterdam

## 2015-11-08 ENCOUNTER — Ambulatory Visit (INDEPENDENT_AMBULATORY_CARE_PROVIDER_SITE_OTHER): Payer: Self-pay | Admitting: Cardiothoracic Surgery

## 2015-11-08 ENCOUNTER — Encounter (HOSPITAL_COMMUNITY): Admission: RE | Admit: 2015-11-08 | Payer: Medicare Other | Source: Ambulatory Visit

## 2015-11-08 ENCOUNTER — Other Ambulatory Visit: Payer: Self-pay | Admitting: *Deleted

## 2015-11-08 ENCOUNTER — Telehealth: Payer: Self-pay | Admitting: Emergency Medicine

## 2015-11-08 ENCOUNTER — Encounter: Payer: Self-pay | Admitting: Cardiothoracic Surgery

## 2015-11-08 VITALS — BP 125/69 | HR 92 | Resp 18 | Ht 71.0 in | Wt 207.0 lb

## 2015-11-08 DIAGNOSIS — J209 Acute bronchitis, unspecified: Secondary | ICD-10-CM

## 2015-11-08 DIAGNOSIS — Z952 Presence of prosthetic heart valve: Secondary | ICD-10-CM

## 2015-11-08 DIAGNOSIS — Z954 Presence of other heart-valve replacement: Secondary | ICD-10-CM

## 2015-11-08 DIAGNOSIS — J45909 Unspecified asthma, uncomplicated: Secondary | ICD-10-CM

## 2015-11-08 DIAGNOSIS — I350 Nonrheumatic aortic (valve) stenosis: Secondary | ICD-10-CM

## 2015-11-08 DIAGNOSIS — R0602 Shortness of breath: Secondary | ICD-10-CM

## 2015-11-08 DIAGNOSIS — Z9889 Other specified postprocedural states: Secondary | ICD-10-CM

## 2015-11-08 DIAGNOSIS — Z8679 Personal history of other diseases of the circulatory system: Secondary | ICD-10-CM

## 2015-11-08 NOTE — Progress Notes (Signed)
PCP is Velna Hatchet, MD Referring Provider is Martinique, Peter M, MD  Chief Complaint  Patient presents with  . Routine Post Op    6 wk f/u with a CXR...HAD CT CHEST 11/07/15    HPI:The patient returns for routine office visit almost 2 half months after combined aVR for aortic stenosis and maze procedure with left atrial clipping. The patient had a postop echo which showed normal function of the aVR, ejection fraction of 50% with LVH.  The patient has not been able to complete postop cardiac rehabilitation because of shortness of breath. The patient has chronic lung disease followed in the pulmonary clinic. Over the past 3 weeks the patient has had exacerbation of his chronic lung disease and was evaluated in the emergency department for shortness of breath and a wet productive cough. CT scan was performed showing trace right pleural effusion, diffuse haziness of the lungs, no evidence of pulmonary emboli or pneumonia. The patient is been treated by his pulmonary office with oral antibiotics and a prednisone taper. This is not helped.  The patient has no symptoms of ankle edema, abdominal ascites, orthopnea or PND. No chest pain. Sternotomy incision is well-healed.   Past Medical History  Diagnosis Date  . Hypertension   . Hyperlipidemia   . Fibromyalgia   . Obesity   . Mitral regurgitation     trivial  . Aortic stenosis, mild   . Left atrial enlargement     LA size 58mm by echo 11/21/11  . Pacemaker 03/31/2012  . Heart murmur   . Asthma     "little bit" (03/31/2012)  . Exertional dyspnea   . Hypothyroidism     S/P radiation  . DJD (degenerative joint disease)   . Arthritis of hand     "just a little bit in both hands" (03/31/2012)  . Atrial fibrillation [facility])     dx '04; DCCV '04, placed on flecainide, failed DCCV 04/2010, flecainide stopped; s/p successful A.fib ablation 01/31/12  . First degree AV block     post ablation, ~454ms  . Second degree AV block   . GERD  (gastroesophageal reflux disease)   . Restless leg syndrome   . Obstructive sleep apnea     mild by sleep study 2013; pt stated he does not have a machine because "it wasn't bad enough for him to have one"  . Diabetes mellitus without complication ([place])     borderline    Past Surgical History  Procedure Laterality Date  . Finger tendon repair  1980's    "right little finger" (03/31/2012)  . US echocardiography  08/07/2009    EF 55-60%  . Doppler echocardiography  03/11/2002    EF 70-75%  . Cardiovascular stress test  03/12/2002    EF 48%, NO EVIDENCE OF ISCHEMIA  . Tee without cardioversion  01/30/2012    Procedure: TRANSESOPHAGEAL ECHOCARDIOGRAM (TEE);  Surgeon: Larey Dresser, MD;  Location: [place];  Service: Cardiovascular;  Laterality: N/A;  ablation next day  . Pacemaker insertion  03/31/2012    STJ Acc[employer] DR pacemaker implanted by Dr Rayann Heman  . Atrial fibrillation ablation  01/30/2012    [facility] by Dr. Rayann Heman  . Lumbar disc surgery  1980's X2;  2000's  . Cardioversion  01/2012; 03/31/2012  . Atrial fibrillation ablation N/A 01/31/2012    Procedure: ATRIAL FIBRILLATION ABLATION;  Surgeon: Thompson Grayer, MD;  Location: [facility] CATH LAB;  Service: Cardiovascular;  Laterality: N/A;  . Cardioversion N/A 02/25/2012    Procedure:  CARDIOVERSION;  Surgeon: Thompson Grayer, MD;  Location: [facility] CATH LAB;  Service: Cardiovascular;  Laterality: N/A;  . Permanent pacemaker insertion N/A 03/31/2012    Procedure: PERMANENT PACEMAKER INSERTION;  Surgeon: Thompson Grayer, MD;  Location: [facility] CATH LAB;  Service: Cardiovascular;  Laterality: N/A;  . Cardioversion N/A 03/31/2012    Procedure: CARDIOVERSION;  Surgeon: Thompson Grayer, MD;  Location: [facility] CATH LAB;  Service: Cardiovascular;  Laterality: N/A;  . Left and right heart catheterization with coronary angiogram N/A 10/27/2012    Procedure: LEFT AND RIGHT HEART CATHETERIZATION WITH CORONARY ANGIOGRAM;  Surgeon: Laverda Page, MD;  Location: [facility] CATH LAB;  Service:  Cardiovascular;  Laterality: N/A;  . Cardiac catheterization N/A 08/09/2015    Procedure: Right/Left Heart Cath and Coronary Angiography;  Surgeon: Peter M Martinique, MD;  Location: [place] CV LAB;  Service: Cardiovascular;  Laterality: N/A;  . Back surgery      X 3  . Finger surgery Left     Middle finger  . Insert / replace / remove pacemaker      [employer]  . Aortic valve replacement N/A 08/22/2015    Procedure: AORTIC VALVE REPLACEMENT (AVR);  Surgeon: Ivin Poot, MD;  Location: [place];  Service: Open Heart Surgery;  Laterality: N/A;  . Maze N/A 08/22/2015    Procedure: MAZE;  Surgeon: Ivin Poot, MD;  Location: [place];  Service: Open Heart Surgery;  Laterality: N/A;  . Clipping of atrial appendage N/A 08/22/2015    Procedure: CLIPPING OF ATRIAL APPENDAGE;  Surgeon: Ivin Poot, MD;  Location: [place];  Service: Open Heart Surgery;  Laterality: N/A;  . Tee without cardioversion N/A 08/22/2015    Procedure: TRANSESOPHAGEAL ECHOCARDIOGRAM (TEE);  Surgeon: Ivin Poot, MD;  Location: [place];  Service: Open Heart Surgery;  Laterality: N/A;    Family History  Problem Relation Age of Onset  . Heart failure Mother   . Stroke Father     Social History Social History  Substance Use Topics  . Smoking status: Former Smoker -- 0.50 packs/day for 15 years    Types: Cigarettes    Quit date: 07/18/1991  . Smokeless tobacco: Never Used  . Alcohol Use: Yes     Comment: occasional    Current Outpatient Prescriptions  Medication Sig Dispense Refill  . amLODipine (NORVASC) 10 MG tablet Take 0.5 tablets (5 mg total) by mouth daily. 30 tablet 11  . atorvastatin (LIPITOR) 40 MG tablet Take 1 tablet (40 mg total) by mouth daily. 90 tablet 1  . budesonide (PULMICORT) 0.25 MG/2ML nebulizer solution Take 2 mLs (0.25 mg total) by nebulization 2 (two) times daily. 360 mL 1  . Cholecalciferol (VITAMIN D) 2000 UNITS CAPS Take 2,000 Units by mouth daily.    . dabigatran (PRADAXA) 150 MG CAPS  capsule Take 1 capsule (150 mg total) by mouth 2 (two) times daily. 60 capsule 1  . fexofenadine (ALLEGRA) 180 MG tablet Take 1 tablet (180 mg total) by mouth daily. 30 tablet 1  . fluticasone (FLONASE) 50 MCG/ACT nasal spray Place into both nostrils 2 (two) times daily as needed for allergies or rhinitis.    . furosemide (LASIX) 40 MG tablet Take 1-2 tablets (40-80 mg total) by mouth daily. 120 tablet 3  . gabapentin (NEURONTIN) 300 MG capsule Take 600 mg by mouth at bedtime.   3  . ipratropium (ATROVENT) 0.02 % nebulizer solution Take 2.5 mLs (0.5 mg total) by nebulization 4 (four) times daily. 300 mL 1  .  levalbuterol (XOPENEX) 0.63 MG/3ML nebulizer solution Take 3 mLs (0.63 mg total) by nebulization every 6 (six) hours as needed for wheezing or shortness of breath. 360 mL 1  . levothyroxine (SYNTHROID, LEVOTHROID) 175 MCG tablet Take 175 mcg by mouth daily before breakfast.    . Melatonin-Pyridoxine (MELATONEX) 3-10 MG TBCR Take 1 tablet by mouth at bedtime.    . metFORMIN (GLUMETZA) 500 MG (MOD) 24 hr tablet Take 500 mg by mouth daily with breakfast.    . metoprolol tartrate (LOPRESSOR) 25 MG tablet Take 1 tablet (25 mg total) by mouth 2 (two) times daily. 60 tablet 3  . pantoprazole (PROTONIX) 40 MG tablet Take 40 mg by mouth daily.    . promethazine (PHENERGAN) 25 MG tablet Take 25 mg by mouth every 6 (six) hours as needed for nausea or vomiting.    [name] rOPINIRole (REQUIP) 1 MG tablet Take 1 tablet (1 mg total) by mouth daily. 90 tablet 2  . spironolactone (ALDACTONE) 25 MG tablet Take 12.5 mg by mouth daily.     . traMADol (ULTRAM) 50 MG tablet Take 1 tablet by mouth daily. Reported on 10/17/2015    . valACYclovir (VALTREX) 1000 MG tablet Take 1 tablet by mouth every 12 (twelve) hours as needed (fever blisters). Reported on 10/17/2015     No current facility-administered medications for this visit.    Allergies  Allergen Reactions  . Meloxicam Rash  . Vancomycin Other (See Comments)     Red Man's syndrome 09/02/15, resolved with diphenhydramine and slowing of rate    Review of Systems   His weight has been stable. He is taking Lasix 40 mg daily and Aldactone 12.5 mg daily for his diastolic heart failure. He is on Pradaxa for history of chronic atrial fib. BP 125/69 mmHg  Pulse 92  Resp 18  Ht 5\' 11"  (1.803 m)  Wt 207 lb (93.895 kg)  BMI 28.88 kg/m2  SpO2 94% Physical Exam Alert and no distress but with a violent cough, productive No JVD Breath sounds coarse Heart rhythm regular-paced rhythm on telemetry No murmur of AI Sternal incision well-healed No ankle edema Neuro intact  Diagnostic Tests: I personally reviewed his recent CT scan of the chest which shows some problem mild interstitial edema. No significant effusion to drain, no significant consolidation of pneumonia.  Impression: Slow recovery after aVR-maze procedure because of shortness of breath, recurrent flareups of his chronic lung disease.  Plan: I prescribed the patient prescription for some more Tussionex to help him sleep at night and a course of Augmentin for coverage of upper airway and sinus infection. I discussed the patient with his pulmonologist Dr. Lamonte Sakai as well. I have encouraged the patient to resume outpatient cardiac rehabilitation once his palmar he symptoms have improved. He will return in 2 months for assessment and review of progress.  Len Childs, MD [facility] [phone]

## 2015-11-08 NOTE — Telephone Encounter (Signed)
Spoke with patient's wife, Adonis Brook, and reviewed Dr. Elmarie Shiley advice with her.  She states she gave him an extra furosemide last night.  States he coughed most of the night.  I advised her to give him additional furosemide today and tomorrow and reassured her that the pulmonary edema is mild per Dr. Acie Fredrickson.  He is not currently on a potassium supplement so I advised her to have him increase dietary intake of potassium-rich foods (she is aware of several sources).  I advised her to call back to report his progress on Thursday or Friday and to call sooner if needed.  She verbalized understanding and agreement and thanked me for the call.

## 2015-11-08 NOTE — Telephone Encounter (Signed)
Called spoke with pt spouse. Pt was in to see Dr. Corrie Dandy on 11/06/15. She reports pt just wants a depo-medrol. Made aware we are unable to give these w/o OV. I offered appt with [facility] (provider e/ opening) and they refused. Reports they will never see him. They are wanting a work in appt w/ Dr. Lamonte Sakai next week. Wife made a comment either they see RB or no one at all. Please advise RB thanks

## 2015-11-08 NOTE — Telephone Encounter (Signed)
Called by Dr Darcey Nora regarding pt's refractory cough. He has been treated unsuccessfully with abx and pred. His wife indicated on OV w Dr vanTrigt today that pt needs depo-medrol. Please arrange for him to be seen [place] office, assess his cough and see if he needs steroids. Thanks.

## 2015-11-09 ENCOUNTER — Telehealth (HOSPITAL_COMMUNITY): Payer: Self-pay | Admitting: *Deleted

## 2015-11-09 ENCOUNTER — Telehealth: Payer: Self-pay | Admitting: Pulmonary Disease

## 2015-11-09 NOTE — Telephone Encounter (Signed)
Called and left message regarding general well being and to ascertain when pt may be ready to resume cardiac rehab.  Contact information provided. Cherre Huger, BSN

## 2015-11-09 NOTE — Telephone Encounter (Signed)
Your next available appointment is not until 12/19/15. Can he wait that long or do you want me to double book your schedule?

## 2015-11-09 NOTE — Telephone Encounter (Signed)
Work him in if possible, preferably into an open slot. If i'm already double booked every day then he will have to see someone else.

## 2015-11-09 NOTE — Telephone Encounter (Signed)
   Pt was recently seen by me this week for persistent SOB with wheezing.  Chest ct scan > GGlike infiltrates, trace effusion. I d/w Dr. Acie Fredrickson re: the case.  Maybe he still has some pulm edema. No PNA seen.   Plan was to inc lasix to 40 mg BID. Pt increased lasix to BID, on 2nd day now. BMP (done outside  on July 12,17) showed Creat at 1.6 stable from may 2017 and K was 4.   Pt saw Dr. Prescott Gum  On 7/11 and pt had mentioned about steroid injection as he has more "feet swelling".Dr. Prescott Gum spoke to Dr. Lamonte Sakai re: the case.   I discussed the case with Dr. Lamonte Sakai.  I spoke with the pt's wife and discussed plan as well today.   Plan : 1. Continue with lasix BID. Plan for 3-5 days.  Dr. Acie Fredrickson ordered lasix.  2.  If pt is not better on lasix and still has wheezing, I suggest decadron shot as well as pred taper. Maybe he needs 40 mg/day for a week then gradual taper off.  He took a short course of pred in June and he was not significantly better.  Maybe he needs longer dose of prednisone. Wife will call if not better.   Dr. Lamonte Sakai - this is an FYI.  I will be out next week and wife might call.    Thanks.  [employer]

## 2015-11-10 ENCOUNTER — Encounter (HOSPITAL_COMMUNITY): Payer: Medicare Other

## 2015-11-10 ENCOUNTER — Telehealth: Payer: Self-pay | Admitting: *Deleted

## 2015-11-10 NOTE — Telephone Encounter (Signed)
Called patient to set up DCCV per Dr Jackalyn Lombard request and spoke with the patient's wife. She says that he saw Dr Prescott Gum on 7/12 and was in NSR per the nurse who hooked him up to electrocardiogram machine.  They did not run an EKG.  He was given medications for his cough and if much better per wife.  I have let her know I will contact Dr Rayann Heman and discuss with him and call her back.  He is going back to see Dr Prescott Gum in 2 months

## 2015-11-10 NOTE — Telephone Encounter (Signed)
Spoke with pt's wife. There was a cancellation on RB's schedule on 11/13/2015 at 4:30pm. Pt has been added to this date and time. Nothing further was needed.

## 2015-11-10 NOTE — Telephone Encounter (Signed)
I reviewed Dr DeDios note, the current plan. We should overbook to see him next week after he has had opportunity to take the increased lasix.

## 2015-11-10 NOTE — Telephone Encounter (Signed)
Discussed with Dr Rayann Heman and he needs to be set up for DCCV.  I have talked to his wife and she will call me once he gets better with cough that he has had.  She feels like he needs at least another week but she will call me when ready to schedule.

## 2015-11-13 ENCOUNTER — Encounter: Payer: Self-pay | Admitting: Emergency Medicine

## 2015-11-13 ENCOUNTER — Ambulatory Visit (INDEPENDENT_AMBULATORY_CARE_PROVIDER_SITE_OTHER): Payer: Medicare Other | Admitting: Emergency Medicine

## 2015-11-13 ENCOUNTER — Encounter (HOSPITAL_COMMUNITY): Payer: Medicare Other

## 2015-11-13 VITALS — BP 126/74 | HR 83 | Ht 71.0 in | Wt 205.0 lb

## 2015-11-13 DIAGNOSIS — J90 Pleural effusion, not elsewhere classified: Secondary | ICD-10-CM

## 2015-11-13 DIAGNOSIS — I509 Heart failure, unspecified: Secondary | ICD-10-CM

## 2015-11-13 DIAGNOSIS — J4520 Mild intermittent asthma, uncomplicated: Secondary | ICD-10-CM | POA: Diagnosis not present

## 2015-11-13 MED ORDER — BUDESONIDE-FORMOTEROL FUMARATE 160-4.5 MCG/ACT IN AERO: 2.0000 | INHALATION_SPRAY | Freq: Two times a day (BID) | RESPIRATORY_TRACT | Status: DC

## 2015-11-13 NOTE — Progress Notes (Signed)
Subjective:    Patient ID: [name], male    DOB: 01-12-1951, 65 y.o.   MRN: [id number]  HPI 65 yo man, former smoker (7-8pk-yrs), hx obesity, HTN, mild AS, A Fib s/p ablation and rate control, mild OSA (by PSG '13), asthma dx several yrs ago by Dr Minna Antis. Has been experiencing exertional dyspnea for 2-3 yrs. He notices dyspnea with climbing a hill. He snores, moves his arms and legs continuously through the night. He has lost 20 lbs over last few months which has helped some.   Eval has included PFT that show mixed disease with a definite obstructive component based on a response to bronchodilator 08/05/13. Diffusion corrected for alveolar volume. CPST and L/R caths as below.   ROV 09/20/14 -- seen by Dr Gwenette Greet in 4/'16 and then TP in early May. At that time was treated for bronchitis w doxy. He is on symbicort bid, uses SABA about 5x a day. He is on fluticasone and allegra. His largest persistent sx are exertional dyspnea. He has mild OSA by PSG 2013. He is having frequent cough, bad at night. Significant nasal congestion. Over the 3 months he has had early satiety, stable wt.   11/06/15 -- -[name] is a 64 year old with past medical history of asthma and CAD. Quit smoking in 1993.  He is here for urgent follow up.  He had valve surgery in April 2017. OR went well but had SOB shortly after and they found fluid in lungs. Was diuresced and got better. Not back at baseline though.  He has been SOB x 5 weeks. Got ZPAK and Pred end of June. He finished his prednisone a week ago.  Over all, he feels some better but is still SOB with ADLs, not as bad as 5 weeks ago. He feels prednsione did NOT make a whole lot of difference.  Not on O2.  Has snoring. No recent sleep study.  His weight has been the same.  He saw cardiologist today and was told he has wheezing and needed pulm f/u.   ROV 11/13/15 --   65 year old man. Minimal tobacco history, COPD based on this and also probable fixed  asthma. He has frequent bouts of bronchitic symptoms superimposed on his chronic cough. Also with a history of coronary disease, atrial fibrillation status post ablation, and an aortic valve replacement for aortic stenosis with Maze procedure and left atrial clipping. He has LVH with ejection fraction of approximately 50%. He has been treated with antibiotics and prednisone since his hospitalization for the valve replacement. His biggest problems have been persistent cough, wheezing and dyspnea. He is unable to lay flat, sleeps in a recliner. He has been recently seen by Dr. Murlean Iba, underwent Ct chest 7/11 that showed scattered diffuse GGI and a small right effusion, treated with an increase in his Lasix to 80 mg a day for 5 days. He has also seen thoracic surgery and was treated with Augmentin and Tussionex. His bronchodilator regimen includes Atrovent nebulizers 4 times a day, Pulmicort nebulizer 2 times a day, Xopenex nebulizer when necessary which he uses rarely. He states that his cough and mucus have improved significantly as has his dyspnea.      Review of Systems  Constitutional: Negative.   HENT: Positive for congestion.   Respiratory: Positive for cough and shortness of breath. Negative for chest tightness and wheezing.   Cardiovascular: Negative.   Gastrointestinal: Negative.   Endocrine: Negative.   Genitourinary: Negative.   Musculoskeletal: Negative.  Allergic/Immunologic: Negative.   Hematological: Negative.   All other systems reviewed and are negative.        Past Medical History  Diagnosis Date  . Hypertension   . Hyperlipidemia   . Fibromyalgia   . Obesity   . Mitral regurgitation     trivial  . Aortic stenosis, mild   . Left atrial enlargement     LA size 69mm by echo 11/21/11  . Pacemaker 03/31/2012  . Heart murmur   . Asthma     "little bit" (03/31/2012)  . Exertional dyspnea   . Hypothyroidism     S/P radiation  . DJD (degenerative joint disease)   .  Arthritis of hand     "just a little bit in both hands" (03/31/2012)  . Atrial fibrillation [facility])     dx '04; DCCV '04, placed on flecainide, failed DCCV 04/2010, flecainide stopped; s/p successful A.fib ablation 01/31/12  . First degree AV block     post ablation, ~470ms  . Second degree AV block   . GERD (gastroesophageal reflux disease)   . Restless leg syndrome   . Obstructive sleep apnea     mild by sleep study 2013; pt stated he does not have a machine because "it wasn't bad enough for him to have one"  . Diabetes mellitus without complication ([place])     borderline     Family History  Problem Relation Age of Onset  . Heart failure Mother   . Stroke Father      Past Surgical History  Procedure Laterality Date  . Finger tendon repair  1980's    "right little finger" (03/31/2012)  . US echocardiography  08/07/2009    EF 55-60%  . Doppler echocardiography  03/11/2002    EF 70-75%  . Cardiovascular stress test  03/12/2002    EF 48%, NO EVIDENCE OF ISCHEMIA  . Tee without cardioversion  01/30/2012    Procedure: TRANSESOPHAGEAL ECHOCARDIOGRAM (TEE);  Surgeon: Larey Dresser, MD;  Location: [place];  Service: Cardiovascular;  Laterality: N/A;  ablation next day  . Pacemaker insertion  03/31/2012    STJ Acc[employer] DR pacemaker implanted by Dr Rayann Heman  . Atrial fibrillation ablation  01/30/2012    [facility] by Dr. Rayann Heman  . Lumbar disc surgery  1980's X2;  2000's  . Cardioversion  01/2012; 03/31/2012  . Atrial fibrillation ablation N/A 01/31/2012    Procedure: ATRIAL FIBRILLATION ABLATION;  Surgeon: Thompson Grayer, MD;  Location[facility] CATH LAB;  Service: Cardiovascular;  Laterality: N/A;  . Cardioversion N/A 02/25/2012    Procedure: CARDIOVERSION;  Surgeon: Thompson Grayer, MD;  Location: [facility] CATH LAB;  Service: Cardiovascular;  Laterality: N/A;  . Permanent pacemaker insertion N/A 03/31/2012    Procedure: PERMANENT PACEMAKER INSERTION;  Surgeon: Thompson Grayer, MD;  Location: [facility] CATH LAB;  Service:  Cardiovascular;  Laterality: N/A;  . Cardioversion N/A 03/31/2012    Procedure: CARDIOVERSION;  Surgeon: Thompson Grayer, MD;  Location: [facility] CATH LAB;  Service: Cardiovascular;  Laterality: N/A;  . Left and right heart catheterization with coronary angiogram N/A 10/27/2012    Procedure: LEFT AND RIGHT HEART CATHETERIZATION WITH CORONARY ANGIOGRAM;  Surgeon: Laverda Page, MD;  Location: [facility] CATH LAB;  Service: Cardiovascular;  Laterality: N/A;  . Cardiac catheterization N/A 08/09/2015    Procedure: Right/Left Heart Cath and Coronary Angiography;  Surgeon: Peter M Martinique, MD;  Location: [facility] CV LAB;  Service: Cardiovascular;  Laterality: N/A;  . Back surgery      X  3  . Finger surgery Left     Middle finger  . Insert / replace / remove pacemaker      [employer] Jude  . Aortic valve replacement N/A 08/22/2015    Procedure: AORTIC VALVE REPLACEMENT (AVR);  Surgeon: Ivin Poot, MD;  Location: [place];  Service: Open Heart Surgery;  Laterality: N/A;  . Maze N/A 08/22/2015    Procedure: MAZE;  Surgeon: Ivin Poot, MD;  Location: [place];  Service: Open Heart Surgery;  Laterality: N/A;  . Clipping of atrial appendage N/A 08/22/2015    Procedure: CLIPPING OF ATRIAL APPENDAGE;  Surgeon: Ivin Poot, MD;  Location: [place];  Service: Open Heart Surgery;  Laterality: N/A;  . Tee without cardioversion N/A 08/22/2015    Procedure: TRANSESOPHAGEAL ECHOCARDIOGRAM (TEE);  Surgeon: Ivin Poot, MD;  Location: [place];  Service: Open Heart Surgery;  Laterality: N/A;    Social History   Social History  . Marital Status: Married    Spouse Name: N/A  . Number of Children: N/A  . Years of Education: N/A   Occupational History  . Not on file.   Social History Main Topics  . Smoking status: Former Smoker -- 0.50 packs/day for 15 years    Types: Cigarettes    Quit date: 07/18/1991  . Smokeless tobacco: Never Used  . Alcohol Use: Yes     Comment: occasional  . Drug Use: Yes    Special: Marijuana  .  Sexual Activity: Not Currently   Other Topics Concern  . Not on file   Social History Narrative   Lives in [place] with spouse.  Works as a Air traffic controller.     Allergies  Allergen Reactions  . Meloxicam Rash  . 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 syndrome 09/02/15, resolved with diphenhydramine and slowing of rate     Outpatient Prescriptions Prior to Visit  Medication Sig Dispense Refill  . amLODipine (NORVASC) 10 MG tablet Take 0.5 tablets (5 mg total) by mouth daily. 30 tablet 11  . atorvastatin (LIPITOR) 40 MG tablet Take 1 tablet (40 mg total) by mouth daily. 90 tablet 1  . budesonide (PULMICORT) 0.25 MG/2ML nebulizer solution Take 2 mLs (0.25 mg total) by nebulization 2 (two) times daily. 360 mL 1  . Cholecalciferol (VITAMIN D) 2000 UNITS CAPS Take 2,000 Units by mouth daily.    . dabigatran (PRADAXA) 150 MG CAPS capsule Take 1 capsule (150 mg total) by mouth 2 (two) times daily. 60 capsule 1  . fexofenadine (ALLEGRA) 180 MG tablet Take 1 tablet (180 mg total) by mouth daily. 30 tablet 1  . fluticasone (FLONASE) 50 MCG/ACT nasal spray Place into both nostrils 2 (two) times daily as needed for allergies or rhinitis.    . furosemide (LASIX) 40 MG tablet Take 1-2 tablets (40-80 mg total) by mouth daily. 120 tablet 3  . gabapentin (NEURONTIN) 300 MG capsule Take 600 mg by mouth at bedtime.   3  . ipratropium (ATROVENT) 0.02 % nebulizer solution Take 2.5 mLs (0.5 mg total) by nebulization 4 (four) times daily. 300 mL 1  . levalbuterol (XOPENEX) 0.63 MG/3ML nebulizer solution Take 3 mLs (0.63 mg total) by nebulization every 6 (six) hours as needed for wheezing or shortness of breath. 360 mL 1  . levothyroxine (SYNTHROID, LEVOTHROID) 175 MCG tablet Take 175 mcg by mouth daily before breakfast.    . Melatonin-Pyridoxine (MELATONEX) 3-10 MG TBCR Take 1 tablet by mouth at bedtime.    [name]  metFORMIN (GLUMETZA) 500 MG (MOD) 24 hr tablet Take 500 mg by mouth daily with  breakfast.    . metoprolol tartrate (LOPRESSOR) 25 MG tablet Take 1 tablet (25 mg total) by mouth 2 (two) times daily. 60 tablet 3  . pantoprazole (PROTONIX) 40 MG tablet Take 40 mg by mouth daily.    . promethazine (PHENERGAN) 25 MG tablet Take 25 mg by mouth every 6 (six) hours as needed for nausea or vomiting.    [name] rOPINIRole (REQUIP) 1 MG tablet Take 1 tablet (1 mg total) by mouth daily. 90 tablet 2  . spironolactone (ALDACTONE) 25 MG tablet Take 12.5 mg by mouth daily.     . traMADol (ULTRAM) 50 MG tablet Take 1 tablet by mouth daily. Reported on 10/17/2015    . valACYclovir (VALTREX) 1000 MG tablet Take 1 tablet by mouth every 12 (twelve) hours as needed (fever blisters). Reported on 10/17/2015     No facility-administered medications prior to visit.   Meds ordered this encounter  Medications  . chlorpheniramine-HYDROcodone (TUSSIONEX PENNKINETIC ER) 10-8 MG/5ML SUER    Sig: Take 5 mLs by mouth every 12 (twelve) hours as needed for cough.  . budesonide-formoterol (SYMBICORT) 160-4.5 MCG/ACT inhaler    Sig: Inhale 2 puffs into the lungs 2 (two) times daily.    Dispense:  1 Inhaler    Refill:  6       Objective:   Physical Exam  Vitals:  Filed Vitals:   11/13/15 1626  BP: 126/74  Pulse: 83  Height: 5\' 11"  (1.803 m)  Weight: 205 lb (92.987 kg)  SpO2: 97%    Body mass index is 28.6 kg/(m^2). Wt Readings from Last 3 Encounters:  11/13/15 205 lb (92.987 kg)  11/08/15 207 lb (93.895 kg)  11/06/15 207 lb (93.895 kg)   Gen: Pleasant, well-nourished, in no distress,  normal affect  ENT: No lesions,  mouth clear,  oropharynx clear, no postnasal drip  Neck: No JVD, no TMG, no carotid bruits  Lungs: No use of accessory muscles, no dullness to percussion, clear without rales or rhonchi  Cardiovascular: RRR, heart sounds normal, no murmur or gallops, no peripheral edema  Musculoskeletal: No deformities, no cyanosis or clubbing  Neuro: alert, non focal  Skin: Warm, no  lesions or rashes            Assessment & Plan:  Intrinsic asthma Unclear that this is an optimal regimen for him. I would like for him to be on a scheduled beta agonist and inhaled steroid. I will stop his Atrovent nebs, stop budesonide nebs. We will start Symbicort which he has tolerated in the past (although I stopped it for possible contribution cough). Continue to keep Xopenex when necessary available at least for now.  Acute CHF (congestive heart failure) (HCC) Suspect that some of his dyspnea and groundglass infiltrates on his CT scan are related to cardiogenic edema. He improved with increased Lasix. I will change him to 60 mg once a day and we will check his metabolic panel in 1 week.      Baltazar Apo, MD, PhD 11/13/2015, 5:58 PM [facility] [phone] or if no answer [phone]

## 2015-11-13 NOTE — Patient Instructions (Signed)
Please stop pulmicort and atrovent nebulizers for now Please start Symbiort 2 puffs twice a day  You may still use your Xopenex nebulizer as needed for shortness of breath.  Please complete your Augmentin prescription Use your tussionex as needed for cough Change your lasix to 60mg  once a day  Get labs (basic metabolic panel) at our office in 1 week.  Follow with Dr Lamonte Sakai in 1 month

## 2015-11-13 NOTE — Assessment & Plan Note (Signed)
Unclear that this is an optimal regimen for him. I would like for him to be on a scheduled beta agonist and inhaled steroid. I will stop his Atrovent nebs, stop budesonide nebs. We will start Symbicort which he has tolerated in the past (although I stopped it for possible contribution cough). Continue to keep Xopenex when necessary available at least for now.

## 2015-11-13 NOTE — Assessment & Plan Note (Signed)
Suspect that some of his dyspnea and groundglass infiltrates on his CT scan are related to cardiogenic edema. He improved with increased Lasix. I will change him to 60 mg once a day and we will check his metabolic panel in 1 week.

## 2015-11-15 ENCOUNTER — Encounter (HOSPITAL_COMMUNITY): Payer: Medicare Other

## 2015-11-15 NOTE — Telephone Encounter (Signed)
Patient was seen by Dr. Lamonte Sakai, pulmonologist, yesterday and advised to increase lasix and have bmet in 1 week.  I called and left a message to encourage patient to schedule DCCV as indicated by Dr. Rayann Heman (see telephone note by Janan Halter, RN on 7/14).

## 2015-11-16 ENCOUNTER — Encounter: Payer: Self-pay | Admitting: Nurse Practitioner

## 2015-11-16 ENCOUNTER — Encounter (HOSPITAL_COMMUNITY): Payer: Self-pay | Admitting: *Deleted

## 2015-11-16 DIAGNOSIS — Z952 Presence of prosthetic heart valve: Secondary | ICD-10-CM

## 2015-11-16 NOTE — Progress Notes (Signed)
Cardiac Individual Treatment Plan  Patient Details  Name: [name] MRN: [id number] Date of Birth: January 23, 1951 Referring Provider:        CARDIAC REHAB PHASE II EXERCISE from 10/25/2015 in [place]   Referring Provider  Mertie Moores MD      Initial Encounter Date:       CARDIAC REHAB PHASE II EXERCISE from 10/25/2015 in [place]   Date  10/17/15   Referring Provider  Nahser, Arnette Norris MD      Visit Diagnosis: S/P AVR  Patient's Home Medications on Admission:  Current outpatient prescriptions:  .  amLODipine (NORVASC) 10 MG tablet, Take 0.5 tablets (5 mg total) by mouth daily., Disp: 30 tablet, Rfl: 11 .  atorvastatin (LIPITOR) 40 MG tablet, Take 1 tablet (40 mg total) by mouth daily., Disp: 90 tablet, Rfl: 1 .  budesonide (PULMICORT) 0.25 MG/2ML nebulizer solution, Take 2 mLs (0.25 mg total) by nebulization 2 (two) times daily., Disp: 360 mL, Rfl: 1 .  budesonide-formoterol (SYMBICORT) 160-4.5 MCG/ACT inhaler, Inhale 2 puffs into the lungs 2 (two) times daily., Disp: 1 Inhaler, Rfl: 6 .  chlorpheniramine-HYDROcodone (TUSSIONEX PENNKINETIC ER) 10-8 MG/5ML SUER, Take 5 mLs by mouth every 12 (twelve) hours as needed for cough., Disp: , Rfl:  .  Cholecalciferol (VITAMIN D) 2000 UNITS CAPS, Take 2,000 Units by mouth daily., Disp: , Rfl:  .  dabigatran (PRADAXA) 150 MG CAPS capsule, Take 1 capsule (150 mg total) by mouth 2 (two) times daily., Disp: 60 capsule, Rfl: 1 .  fexofenadine (ALLEGRA) 180 MG tablet, Take 1 tablet (180 mg total) by mouth daily., Disp: 30 tablet, Rfl: 1 .  fluticasone (FLONASE) 50 MCG/ACT nasal spray, Place into both nostrils 2 (two) times daily as needed for allergies or rhinitis., Disp: , Rfl:  .  furosemide (LASIX) 40 MG tablet, Take 1-2 tablets (40-80 mg total) by mouth daily., Disp: 120 tablet, Rfl: 3 .  gabapentin (NEURONTIN) 300 MG capsule, Take 600 mg by mouth at bedtime. , Disp: , Rfl: 3 .   ipratropium (ATROVENT) 0.02 % nebulizer solution, Take 2.5 mLs (0.5 mg total) by nebulization 4 (four) times daily., Disp: 300 mL, Rfl: 1 .  levalbuterol (XOPENEX) 0.63 MG/3ML nebulizer solution, Take 3 mLs (0.63 mg total) by nebulization every 6 (six) hours as needed for wheezing or shortness of breath., Disp: 360 mL, Rfl: 1 .  levothyroxine (SYNTHROID, LEVOTHROID) 175 MCG tablet, Take 175 mcg by mouth daily before breakfast., Disp: , Rfl:  .  Melatonin-Pyridoxine (MELATONEX) 3-10 MG TBCR, Take 1 tablet by mouth at bedtime., Disp: , Rfl:  .  metFORMIN (GLUMETZA) 500 MG (MOD) 24 hr tablet, Take 500 mg by mouth daily with breakfast., Disp: , Rfl:  .  metoprolol tartrate (LOPRESSOR) 25 MG tablet, Take 1 tablet (25 mg total) by mouth 2 (two) times daily., Disp: 60 tablet, Rfl: 3 .  pantoprazole (PROTONIX) 40 MG tablet, Take 40 mg by mouth daily., Disp: , Rfl:  .  promethazine (PHENERGAN) 25 MG tablet, Take 25 mg by mouth every 6 (six) hours as needed for nausea or vomiting., Disp: , Rfl:  .  rOPINIRole (REQUIP) 1 MG tablet, Take 1 tablet (1 mg total) by mouth daily., Disp: 90 tablet, Rfl: 2 .  spironolactone (ALDACTONE) 25 MG tablet, Take 12.5 mg by mouth daily. , Disp: , Rfl:  .  traMADol (ULTRAM) 50 MG tablet, Take 1 tablet by mouth daily. Reported on 10/17/2015, Disp: , Rfl:  .  valACYclovir (VALTREX) 1000 MG tablet, Take 1 tablet by mouth every 12 (twelve) hours as needed (fever blisters). Reported on 10/17/2015, Disp: , Rfl:   Past Medical History: Past Medical History  Diagnosis Date  . Hypertension   . Hyperlipidemia   . Fibromyalgia   . Obesity   . Mitral regurgitation     trivial  . Aortic stenosis, mild   . Left atrial enlargement     LA size 61mm by echo 11/21/11  . Pacemaker 03/31/2012  . Heart murmur   . Asthma     "little bit" (03/31/2012)  . Exertional dyspnea   . Hypothyroidism     S/P radiation  . DJD (degenerative joint disease)   . Arthritis of hand     "just a little  bit in both hands" (03/31/2012)  . Atrial fibrillation [facility])     dx '04; DCCV '04, placed on flecainide, failed DCCV 04/2010, flecainide stopped; s/p successful A.fib ablation 01/31/12  . 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 Second degree AV block   . GERD (gastroesophageal reflux disease)   . Restless leg syndrome   . Obstructive sleep apnea     mild by sleep study 2013; pt stated he does not have a machine because "it wasn't bad enough for him to have one"  . Diabetes mellitus without complication ([place])     borderline    Tobacco Use: History  Smoking status  . Former Smoker -- 0.50 packs/day for 15 years  . Types: Cigarettes  . Quit date: 07/18/1991  Smokeless tobacco  . Never Used    Labs: Recent Review Flowsheet Data    Labs for ITP Cardiac and Pulmonary Rehab Latest Ref Rng 08/22/2015 08/22/2015 08/22/2015 08/23/2015 08/23/2015   PHART 7.350 - 7.450 7.348(L) - 7.321(L) 7.315(L) -   PCO2ART 35.0 - 45.0 mmHg 50.5(H) - 45.3(H) 41.3 -   HCO3 20.0 - 24.0 mEq/L 28.0(H) - 23.4 21.0 -   TCO2 0 - 100 mmol/L 30 25 25 22 26    ACIDBASEDEF 0.0 - 2.0 mmol/L - - 3.0(H) 5.0(H) -   O2SAT - 98.0 - 98.0 94.0 -      Capillary Blood Glucose: Lab Results  Component Value Date   GLUCAP 120* 09/09/2015   GLUCAP 91 09/09/2015   GLUCAP 148* 09/08/2015   GLUCAP 87 09/08/2015   GLUCAP 124* 09/08/2015     Exercise Target Goals:    Exercise Program Goal: Individual exercise prescription set with THRR, safety & activity barriers. Participant demonstrates ability to understand and report RPE using BORG scale, to self-measure pulse accurately, and to acknowledge the importance of the exercise prescription.  Exercise Prescription Goal: Starting with aerobic activity 30 plus minutes a day, 3 days per week for initial exercise prescription. Provide home exercise prescription and guidelines that participant acknowledges understanding prior to discharge.  Activity Barriers & Risk  Stratification:     Activity Barriers & Cardiac Risk Stratification - 10/19/15 1544    Activity Barriers & Cardiac Risk Stratification   Activity Barriers Fibromyalgia;Arthritis      6 Minute Walk:     6 Minute Walk      10/17/15 1415       6 Minute Walk   Phase Initial     Distance 1401 feet     Walk Time 6 minutes     # of Rest Breaks 0     MPH 2.65     METS 3.45  RPE 13     VO2 Peak 12.07     Symptoms Yes (comment)     Comments Moderate SOB at end of 6 MWT.     Resting HR 74 bpm     Resting BP 140/80 mmHg     Max Ex. HR 110 bpm     Max Ex. BP 142/70 mmHg     2 Minute Post BP 114/60 mmHg        Initial Exercise Prescription:     Initial Exercise Prescription - 10/25/15 1100    Date of Initial Exercise RX and Referring Provider   Date 10/17/15   Referring Provider Nahser, Philip MD   Treadmill   MPH 2   Grade 1   Minutes 10   METs 2.81   Bike   Level 1.2   Minutes 10   METs 3.45   NuStep   Level 2   Minutes 10   METs 2.5   Track   Laps 7   Minutes 10   Prescription Details   Frequency (times per week) 3   Intensity   THRR 40-80% of Max Heartrate 62-123   Ratings of Perceived Exertion 11-13   Perceived Dyspnea 0-4   Progression   Progression Continue to progress workloads to maintain intensity without signs/symptoms of physical distress.   Resistance Training   Training Prescription Yes   Weight 2 lbs.   Reps 10-12      Perform Capillary Blood Glucose checks as needed.  Exercise Prescription Changes:   Exercise Comments:   Discharge Exercise Prescription (Final Exercise Prescription Changes):   Nutrition:  Target Goals: Understanding of nutrition guidelines, daily intake of sodium 1500mg , cholesterol 200mg , calories 30% from fat and 7% or less from saturated fats, daily to have 5 or more servings of fruits and vegetables.  Biometrics:     Pre Biometrics - 10/17/15 1415    Pre Biometrics   Height 5' 10.25" (1.784 m)    Weight 202 lb 9.6 oz (91.9 kg)   Waist Circumference 39 inches   Hip Circumference 42 inches   Waist to Hip Ratio 0.93 %   BMI (Calculated) 28.9   Triceps Skinfold 15 mm   % Body Fat 27.2 %   Grip Strength 42.5 kg   Flexibility 0 in   Single Leg Stand 18.5 seconds       Nutrition Therapy Plan and Nutrition Goals:     Nutrition Therapy & Goals - 10/25/15 1005    Nutrition Therapy   Diet Carb Modified, Therapeutic Lifestyle Changes   Personal Nutrition Goals   Personal Goal #1 1-2 lb wt loss per week to a goal wt loss of 6-24 lb at graduation from Cardiac Rehab   Personal Goal #2 Knowledge re: the basics of the DM diet   Intervention Plan   Intervention Prescribe, educate and counsel regarding individualized specific dietary modifications aiming towards targeted core components such as weight, hypertension, lipid management, diabetes, heart failure and other comorbidities.   Expected Outcomes Short Term Goal: Understand basic principles of dietary content, such as calories, fat, sodium, cholesterol and nutrients.;Long Term Goal: Adherence to prescribed nutrition plan.      Nutrition Discharge: Nutrition Scores:     Nutrition Assessments - 10/25/15 1007    MEDFICTS Scores   Pre Score 59      Nutrition Goals Re-Evaluation:   Psychosocial: Target Goals: Acknowledge presence or absence of depression, maximize coping skills, provide positive support system. Participant is able to verbalize types  and ability to use techniques and skills needed for reducing stress and depression.  Initial Review & Psychosocial Screening:     Initial Psych Review & Screening - 10/17/15 1642    Initial Review   Current issues with Current Stress Concerns   Family Dynamics   Good Support System? Yes   Barriers   Psychosocial barriers to participate in program The patient should benefit from training in stress management and relaxation.   Screening Interventions   Interventions Encouraged to  exercise      Quality of Life Scores:     Quality of Life - 10/17/15 1415    Quality of Life Scores   Health/Function Pre 27.32 %   Socioeconomic Pre 27.86 %   Psych/Spiritual Pre 30 %   Family Pre 30 %   GLOBAL Pre 28.41 %      PHQ-9:     Recent Review Flowsheet Data    Depression screen [facility] 2/9 10/25/2015   Decreased Interest 0   Down, Depressed, Hopeless 0   PHQ - 2 Score 0      Psychosocial Evaluation and Intervention:   Psychosocial Re-Evaluation:   Vocational Rehabilitation: Provide vocational rehab assistance to qualifying candidates.   Vocational Rehab Evaluation & Intervention:     Vocational Rehab - 10/17/15 1639    Initial Vocational Rehab Evaluation & Intervention   Assessment shows need for Vocational Rehabilitation No      Education: Education Goals: Education classes will be provided on a weekly basis, covering required topics. Participant will state understanding/return demonstration of topics presented.  Learning Barriers/Preferences:     Learning Barriers/Preferences - 10/17/15 1639    Learning Barriers/Preferences   Learning Barriers Sight   Learning Preferences Written Material;Skilled Demonstration      Education Topics: Count Your Pulse:  -Group instruction provided by verbal instruction, demonstration, patient participation and written materials to support subject.  Instructors address importance of being able to find your pulse and how to count your pulse when at home without a heart monitor.  Patients get hands on experience counting their pulse with staff help and individually.   Heart Attack, Angina, and Risk Factor Modification:  -Group instruction provided by verbal instruction, video, and written materials to support subject.  Instructors address signs and symptoms of angina and heart attacks.    Also discuss risk factors for heart disease and how to make changes to improve heart health risk factors.   Functional  Fitness:  -Group instruction provided by verbal instruction, demonstration, patient participation, and written materials to support subject.  Instructors address safety measures for doing things around the house.  Discuss how to get up and down off the floor, how to pick things up properly, how to safely get out of a chair without assistance, and balance training.   Meditation and Mindfulness:  -Group instruction provided by verbal instruction, patient participation, and written materials to support subject.  Instructor addresses importance of mindfulness and meditation practice to help reduce stress and improve awareness.  Instructor also leads participants through a meditation exercise.    Stretching for Flexibility and Mobility:  -Group instruction provided by verbal instruction, patient participation, and written materials to support subject.  Instructors lead participants through series of stretches that are designed to increase flexibility thus improving mobility.  These stretches are additional exercise for major muscle groups that are typically performed during regular warm up and cool down.   Hands Only CPR Anytime:  -Group instruction provided by verbal instruction, video, patient  participation and written materials to support subject.  Instructors co-teach with AHA video for hands only CPR.  Participants get hands on experience with mannequins.   Nutrition I class: Heart Healthy Eating:  -Group instruction provided by PowerPoint slides, verbal discussion, and written materials to support subject matter. The instructor gives an explanation and review of the Therapeutic Lifestyle Changes diet recommendations, which includes a discussion on lipid goals, dietary fat, sodium, fiber, plant stanol/sterol esters, sugar, and the components of a well-balanced, healthy diet.   Nutrition II class: Lifestyle Skills:  -Group instruction provided by PowerPoint slides, verbal discussion, and written  materials to support subject matter. The instructor gives an explanation and review of label reading, grocery shopping for heart health, heart healthy recipe modifications, and ways to make healthier choices when eating out.   Diabetes Question & Answer:  -Group instruction provided by PowerPoint slides, verbal discussion, and written materials to support subject matter. The instructor gives an explanation and review of diabetes co-morbidities, pre- and post-prandial blood glucose goals, pre-exercise blood glucose goals, signs, symptoms, and treatment of hypoglycemia and hyperglycemia, and foot care basics.   Diabetes Blitz:  -Group instruction provided by PowerPoint slides, verbal discussion, and written materials to support subject matter. The instructor gives an explanation and review of the physiology behind type 1 and type 2 diabetes, diabetes medications and rational behind using different medications, pre- and post-prandial blood glucose recommendations and Hemoglobin A1c goals, diabetes diet, and exercise including blood glucose guidelines for exercising safely.    Portion Distortion:  -Group instruction provided by PowerPoint slides, verbal discussion, written materials, and food models to support subject matter. The instructor gives an explanation of serving size versus portion size, changes in portions sizes over the last 20 years, and what consists of a serving from each food group.          CARDIAC REHAB PHASE II EXERCISE from 10/25/2015 in [place]   Date  10/25/15   Educator  RD   Instruction Review Code  2- meets goals/outcomes      Stress Management:  -Group instruction provided by verbal instruction, video, and written materials to support subject matter.  Instructors review role of stress in heart disease and how to cope with stress positively.     Exercising on Your Own:  -Group instruction provided by verbal instruction, power point,  and written materials to support subject.  Instructors discuss benefits of exercise, components of exercise, frequency and intensity of exercise, and end points for exercise.  Also discuss use of nitroglycerin and activating EMS.  Review options of places to exercise outside of rehab.  Review guidelines for sex with heart disease.   Cardiac Drugs I:  -Group instruction provided by verbal instruction and written materials to support subject.  Instructor reviews cardiac drug classes: antiplatelets, anticoagulants, beta blockers, and statins.  Instructor discusses reasons, side effects, and lifestyle considerations for each drug class.   Cardiac Drugs II:  -Group instruction provided by verbal instruction and written materials to support subject.  Instructor reviews cardiac drug classes: angiotensin converting enzyme inhibitors (ACE-I), angiotensin II receptor blockers (ARBs), nitrates, and calcium channel blockers.  Instructor discusses reasons, side effects, and lifestyle considerations for each drug class.   Anatomy and Physiology of the Circulatory System:  -Group instruction provided by verbal instruction, video, and written materials to support subject.  Reviews functional anatomy of heart, how it relates to various diagnoses, and what role the heart plays in the overall  system.   Knowledge Questionnaire Score:     Knowledge Questionnaire Score - 10/19/15 1546    Knowledge Questionnaire Score   Pre Score        Core Components/Risk Factors/Patient Goals at Admission:     Personal Goals and Risk Factors at Admission - 10/17/15 1640    Core Components/Risk Factors/Patient Goals on Admission    Weight Management Weight Loss;Yes   Intervention Weight Management: Develop a combined nutrition and exercise program designed to reach desired caloric intake, while maintaining appropriate intake of nutrient and fiber, sodium and fats, and appropriate energy expenditure required for the weight  goal.;Weight Management: Provide education and appropriate resources to help participant work on and attain dietary goals.;Weight Management/Obesity: Establish reasonable short term and long term weight goals.   Admit Weight 202 lb 9.6 oz (91.9 kg)   Expected Outcomes Short Term: Continue to assess and modify interventions until short term weight is achieved;Long Term: Adherence to nutrition and physical activity/exercise program aimed toward attainment of established weight goal;Understanding of distribution of calorie intake throughout the day with the consumption of 4-5 meals/snacks   Heart Failure Yes   Personal Goal Other Yes   Personal Goal Be able to do ADL's.   Intervention Provide individualized exercise prescription to improve cardiorespiratory fitness and mobility, so patient is able to do ADL's.   Expected Outcomes Maintain regular exercise routine including aerobic exercise, stretching and light hand weights.      Core Components/Risk Factors/Patient Goals Review:    Core Components/Risk Factors/Patient Goals at Discharge (Final Review):    ITP Comments:     ITP Comments      10/17/15 1638           ITP Comments Dr. Fransico Him, Medical Director           Comments:  Pt has completed one session for orientation and walk test.  Pt unable to start full exercise due to respiratory illness.  Pt started on Augmentin.  Plan to start full exercise soon. Cherre Huger, BSN

## 2015-11-16 NOTE — Telephone Encounter (Signed)
Left detailed message on patient's home number that DCCV is scheduled for Thursday July 27 with Dr. Aundra Dubin and lab appointment is scheduled for Tuesday July 25.  I have placed pre-procedure instructions in the mail and advised patient to call office with questions or concerns.

## 2015-11-16 NOTE — Telephone Encounter (Signed)
Spoke with patient's wife who states patient has increased lasix as directed by Dr. Lamonte Sakai but is uncertain yet as to whether it has given him any relief of symptoms.  She states he continues to cough frequently.  I advised her that Dr. Acie Fredrickson and Dr. Rayann Heman advise patient have a cardioversion for his atrial fib which may alleviate some of his symptoms of fatigue.  She and patient, who was in the background, verbalized agreement and advised that I can schedule for any day next week except Monday.  I advised I will call her back with date and time of the procedure.  She verbalized understanding and agreement.

## 2015-11-17 ENCOUNTER — Encounter (HOSPITAL_COMMUNITY): Payer: Medicare Other

## 2015-11-20 ENCOUNTER — Encounter (HOSPITAL_COMMUNITY): Payer: Medicare Other

## 2015-11-21 ENCOUNTER — Encounter: Payer: Self-pay | Admitting: Emergency Medicine

## 2015-11-21 ENCOUNTER — Other Ambulatory Visit (INDEPENDENT_AMBULATORY_CARE_PROVIDER_SITE_OTHER): Payer: Medicare Other

## 2015-11-21 DIAGNOSIS — I481 Persistent atrial fibrillation: Secondary | ICD-10-CM | POA: Diagnosis not present

## 2015-11-21 DIAGNOSIS — I4819 Other persistent atrial fibrillation: Secondary | ICD-10-CM

## 2015-11-21 LAB — BASIC METABOLIC PANEL
BUN: 19 mg/dL (ref 7–25)
CO2: 28 mmol/L (ref 20–31)
Calcium: 8.8 mg/dL (ref 8.6–10.3)
Chloride: 98 mmol/L (ref 98–110)
Creat: 1.54 mg/dL — ABNORMAL HIGH (ref 0.70–1.25)
Glucose, Bld: 197 mg/dL — ABNORMAL HIGH (ref 65–99)
Potassium: 3.6 mmol/L (ref 3.5–5.3)
Sodium: 136 mmol/L (ref 135–146)

## 2015-11-21 LAB — CBC WITH DIFFERENTIAL/PLATELET
Basophils Absolute: 0 cells/uL (ref 0–200)
Basophils Relative: 0 %
Eosinophils Absolute: 95 cells/uL (ref 15–500)
Eosinophils Relative: 1 %
HCT: 30.5 % — ABNORMAL LOW (ref 38.5–50.0)
Hemoglobin: 9.3 g/dL — ABNORMAL LOW (ref 13.2–17.1)
Lymphocytes Relative: 12 %
Lymphs Abs: 1140 cells/uL (ref 850–3900)
MCH: 25.3 pg — ABNORMAL LOW (ref 27.0–33.0)
MCHC: 30.5 g/dL — ABNORMAL LOW (ref 32.0–36.0)
MCV: 82.9 fL (ref 80.0–100.0)
MPV: 9.5 fL (ref 7.5–12.5)
Monocytes Absolute: 760 cells/uL (ref 200–950)
Monocytes Relative: 8 %
Neutro Abs: 7505 cells/uL (ref 1500–7800)
Neutrophils Relative %: 79 %
Platelets: 266 10*3/uL (ref 140–400)
RBC: 3.68 MIL/uL — ABNORMAL LOW (ref 4.20–5.80)
RDW: 17.9 % — ABNORMAL HIGH (ref 11.0–15.0)
WBC: 9.5 10*3/uL (ref 3.8–10.8)

## 2015-11-21 LAB — PROTIME-INR
INR: 1.2 — ABNORMAL HIGH
Prothrombin Time: 13 s — ABNORMAL HIGH (ref 9.0–11.5)

## 2015-11-22 ENCOUNTER — Other Ambulatory Visit: Payer: Self-pay | Admitting: Nurse Practitioner

## 2015-11-22 ENCOUNTER — Encounter (HOSPITAL_COMMUNITY): Payer: Medicare Other

## 2015-11-23 ENCOUNTER — Ambulatory Visit (HOSPITAL_COMMUNITY): Payer: Medicare Other | Admitting: Anesthesiology

## 2015-11-23 ENCOUNTER — Inpatient Hospital Stay (HOSPITAL_COMMUNITY)
Admission: AD | Admit: 2015-11-23 | Discharge: 2015-11-24 | DRG: 291 | Disposition: A | Discharge status: Home / Self Care | Payer: Medicare Other | Source: Ambulatory Visit | Attending: Cardiovascular Disease | Admitting: Cardiovascular Disease

## 2015-11-23 ENCOUNTER — Inpatient Hospital Stay (HOSPITAL_COMMUNITY): Payer: Medicare Other

## 2015-11-23 ENCOUNTER — Encounter (HOSPITAL_COMMUNITY): Payer: Self-pay | Admitting: *Deleted

## 2015-11-23 ENCOUNTER — Encounter (HOSPITAL_COMMUNITY)
Admission: AD | Disposition: A | Discharge status: Home / Self Care | Payer: Self-pay | Source: Ambulatory Visit | Attending: Cardiovascular Disease

## 2015-11-23 DIAGNOSIS — E039 Hypothyroidism, unspecified: Secondary | ICD-10-CM | POA: Diagnosis present

## 2015-11-23 DIAGNOSIS — G4733 Obstructive sleep apnea (adult) (pediatric): Secondary | ICD-10-CM | POA: Diagnosis present

## 2015-11-23 DIAGNOSIS — I5033 Acute on chronic diastolic (congestive) heart failure: Secondary | ICD-10-CM | POA: Diagnosis present

## 2015-11-23 DIAGNOSIS — R0602 Shortness of breath: Secondary | ICD-10-CM | POA: Diagnosis present

## 2015-11-23 DIAGNOSIS — I110 Hypertensive heart disease with heart failure: Principal | ICD-10-CM | POA: Diagnosis present

## 2015-11-23 DIAGNOSIS — I10 Essential (primary) hypertension: Secondary | ICD-10-CM | POA: Diagnosis present

## 2015-11-23 DIAGNOSIS — I481 Persistent atrial fibrillation: Secondary | ICD-10-CM | POA: Diagnosis present

## 2015-11-23 DIAGNOSIS — E785 Hyperlipidemia, unspecified: Secondary | ICD-10-CM | POA: Diagnosis present

## 2015-11-23 DIAGNOSIS — Z87891 Personal history of nicotine dependence: Secondary | ICD-10-CM | POA: Diagnosis not present

## 2015-11-23 DIAGNOSIS — I509 Heart failure, unspecified: Secondary | ICD-10-CM | POA: Diagnosis not present

## 2015-11-23 DIAGNOSIS — Z950 Presence of cardiac pacemaker: Secondary | ICD-10-CM

## 2015-11-23 DIAGNOSIS — I340 Nonrheumatic mitral (valve) insufficiency: Secondary | ICD-10-CM | POA: Diagnosis present

## 2015-11-23 DIAGNOSIS — J449 Chronic obstructive pulmonary disease, unspecified: Secondary | ICD-10-CM | POA: Diagnosis present

## 2015-11-23 DIAGNOSIS — Z79899 Other long term (current) drug therapy: Secondary | ICD-10-CM

## 2015-11-23 DIAGNOSIS — Z7984 Long term (current) use of oral hypoglycemic drugs: Secondary | ICD-10-CM | POA: Diagnosis not present

## 2015-11-23 DIAGNOSIS — J9621 Acute and chronic respiratory failure with hypoxia: Secondary | ICD-10-CM | POA: Diagnosis present

## 2015-11-23 DIAGNOSIS — K219 Gastro-esophageal reflux disease without esophagitis: Secondary | ICD-10-CM | POA: Diagnosis present

## 2015-11-23 DIAGNOSIS — I272 Other secondary pulmonary hypertension: Secondary | ICD-10-CM | POA: Diagnosis present

## 2015-11-23 DIAGNOSIS — Z954 Presence of other heart-valve replacement: Secondary | ICD-10-CM

## 2015-11-23 DIAGNOSIS — G2581 Restless legs syndrome: Secondary | ICD-10-CM | POA: Diagnosis present

## 2015-11-23 DIAGNOSIS — J9601 Acute respiratory failure with hypoxia: Secondary | ICD-10-CM | POA: Diagnosis present

## 2015-11-23 DIAGNOSIS — Z923 Personal history of irradiation: Secondary | ICD-10-CM

## 2015-11-23 DIAGNOSIS — E119 Type 2 diabetes mellitus without complications: Secondary | ICD-10-CM | POA: Diagnosis present

## 2015-11-23 DIAGNOSIS — I4891 Unspecified atrial fibrillation: Secondary | ICD-10-CM

## 2015-11-23 DIAGNOSIS — M797 Fibromyalgia: Secondary | ICD-10-CM | POA: Diagnosis present

## 2015-11-23 DIAGNOSIS — Z953 Presence of xenogenic heart valve: Secondary | ICD-10-CM

## 2015-11-23 HISTORY — DX: Heart failure, unspecified: I50.9

## 2015-11-23 HISTORY — PX: CARDIOVERSION: SHX1299

## 2015-11-23 LAB — BASIC METABOLIC PANEL
Anion gap: 10 (ref 5–15)
BUN: 16 mg/dL (ref 6–20)
CO2: 28 mmol/L (ref 22–32)
Calcium: 9.1 mg/dL (ref 8.9–10.3)
Chloride: 101 mmol/L (ref 101–111)
Creatinine, Ser: 1.56 mg/dL — ABNORMAL HIGH (ref 0.61–1.24)
GFR calc Af Amer: 52 mL/min — ABNORMAL LOW (ref >60)
GFR calc non Af Amer: 45 mL/min — ABNORMAL LOW (ref >60)
Glucose, Bld: 152 mg/dL — ABNORMAL HIGH (ref 65–99)
Potassium: 4.1 mmol/L (ref 3.5–5.1)
Sodium: 139 mmol/L (ref 135–145)

## 2015-11-23 LAB — GLUCOSE, CAPILLARY
Glucose-Capillary: 112 mg/dL — ABNORMAL HIGH (ref 65–99)
Glucose-Capillary: 171 mg/dL — ABNORMAL HIGH (ref 65–99)
Glucose-Capillary: 120 mg/dL — ABNORMAL HIGH (ref 65–99)

## 2015-11-23 LAB — BRAIN NATRIURETIC PEPTIDE: B Natriuretic Peptide: 159.2 pg/mL — ABNORMAL HIGH (ref 0.0–100.0)

## 2015-11-23 SURGERY — CARDIOVERSION
Anesthesia: Monitor Anesthesia Care

## 2015-11-23 MED ORDER — SODIUM CHLORIDE 0.9 % IV SOLN: 250.0000 mL | INTRAVENOUS | Status: DC | PRN

## 2015-11-23 MED ORDER — HYDROCOD POLST-CPM POLST ER 10-8 MG/5ML PO SUER: 5.0000 mL | Freq: Two times a day (BID) | ORAL | Status: DC | PRN

## 2015-11-23 MED ORDER — METOPROLOL TARTRATE 25 MG PO TABS
25.0000 mg | ORAL_TABLET | Freq: Two times a day (BID) | ORAL | Status: DC
  Administered 2015-11-23 – 2015-11-24 (×2): 25 mg via ORAL
  Filled 2015-11-23 (×2): qty 1

## 2015-11-23 MED ORDER — INSULIN ASPART 100 UNIT/ML ~~LOC~~ SOLN
0.0000 [IU] | Freq: Three times a day (TID) | SUBCUTANEOUS | Status: DC
Start: 2015-11-23 — End: 2015-11-24
  Administered 2015-11-23: 3 [IU] via SUBCUTANEOUS

## 2015-11-23 MED ORDER — ACETAMINOPHEN 325 MG PO TABS: 650.0000 mg | ORAL_TABLET | ORAL | Status: DC | PRN

## 2015-11-23 MED ORDER — SODIUM CHLORIDE 0.9% FLUSH: 3.0000 mL | INTRAVENOUS | Status: DC | PRN

## 2015-11-23 MED ORDER — LORATADINE 10 MG PO TABS
10.0000 mg | ORAL_TABLET | Freq: Every day | ORAL | Status: DC
  Administered 2015-11-23 – 2015-11-24 (×2): 10 mg via ORAL
  Filled 2015-11-23 (×2): qty 1

## 2015-11-23 MED ORDER — SODIUM CHLORIDE 0.9% FLUSH: 3.0000 mL | Freq: Two times a day (BID) | INTRAVENOUS | Status: DC

## 2015-11-23 MED ORDER — FUROSEMIDE 10 MG/ML IJ SOLN
40.0000 mg | Freq: Two times a day (BID) | INTRAMUSCULAR | Status: DC
  Administered 2015-11-23 – 2015-11-24 (×2): 40 mg via INTRAVENOUS
  Filled 2015-11-23 (×2): qty 4

## 2015-11-23 MED ORDER — FLUTICASONE PROPIONATE 50 MCG/ACT NA SUSP
1.0000 | Freq: Two times a day (BID) | NASAL | Status: DC | PRN
  Filled 2015-11-23: qty 16

## 2015-11-23 MED ORDER — MOMETASONE FURO-FORMOTEROL FUM 200-5 MCG/ACT IN AERO
2.0000 | INHALATION_SPRAY | Freq: Two times a day (BID) | RESPIRATORY_TRACT | Status: DC
  Filled 2015-11-23: qty 8.8

## 2015-11-23 MED ORDER — SODIUM CHLORIDE 0.9 % IV SOLN
INTRAVENOUS | Status: DC
  Administered 2015-11-23: 09:00:00 via INTRAVENOUS

## 2015-11-23 MED ORDER — BUDESONIDE 0.25 MG/2ML IN SUSP: 0.2500 mg | Freq: Two times a day (BID) | RESPIRATORY_TRACT | Status: DC

## 2015-11-23 MED ORDER — SPIRONOLACTONE 25 MG PO TABS
12.5000 mg | ORAL_TABLET | Freq: Every day | ORAL | Status: DC
  Administered 2015-11-24: 12.5 mg via ORAL
  Filled 2015-11-23: qty 1

## 2015-11-23 MED ORDER — NITROGLYCERIN 0.4 MG SL SUBL: 0.4000 mg | SUBLINGUAL_TABLET | SUBLINGUAL | Status: DC | PRN

## 2015-11-23 MED ORDER — ONDANSETRON HCL 4 MG/2ML IJ SOLN: 4.0000 mg | Freq: Four times a day (QID) | INTRAMUSCULAR | Status: DC | PRN

## 2015-11-23 MED ORDER — TRAMADOL HCL 50 MG PO TABS
50.0000 mg | ORAL_TABLET | Freq: Every day | ORAL | Status: DC
  Administered 2015-11-23 – 2015-11-24 (×2): 50 mg via ORAL
  Filled 2015-11-23 (×2): qty 1

## 2015-11-23 MED ORDER — ROPINIROLE HCL 1 MG PO TABS
1.0000 mg | ORAL_TABLET | Freq: Every day | ORAL | Status: DC
  Administered 2015-11-24: 1 mg via ORAL
  Filled 2015-11-23: qty 1

## 2015-11-23 MED ORDER — SODIUM CHLORIDE 0.9% FLUSH
3.0000 mL | Freq: Two times a day (BID) | INTRAVENOUS | Status: DC
  Administered 2015-11-23 – 2015-11-24 (×2): 3 mL via INTRAVENOUS

## 2015-11-23 MED ORDER — PANTOPRAZOLE SODIUM 40 MG PO TBEC
40.0000 mg | DELAYED_RELEASE_TABLET | Freq: Every day | ORAL | Status: DC
  Administered 2015-11-24: 40 mg via ORAL
  Filled 2015-11-23: qty 1

## 2015-11-23 MED ORDER — GABAPENTIN 300 MG PO CAPS
600.0000 mg | ORAL_CAPSULE | Freq: Every day | ORAL | Status: DC
  Administered 2015-11-23: 600 mg via ORAL
  Filled 2015-11-23: qty 2

## 2015-11-23 MED ORDER — ZOLPIDEM TARTRATE 5 MG PO TABS: 5.0000 mg | ORAL_TABLET | Freq: Every evening | ORAL | Status: DC | PRN

## 2015-11-23 MED ORDER — FUROSEMIDE 10 MG/ML IJ SOLN
40.0000 mg | Freq: Once | INTRAMUSCULAR | Status: AC
  Administered 2015-11-23: 40 mg via INTRAVENOUS

## 2015-11-23 MED ORDER — PROPOFOL 10 MG/ML IV BOLUS
INTRAVENOUS | Status: DC | PRN
  Administered 2015-11-23: 80 mg via INTRAVENOUS

## 2015-11-23 MED ORDER — ATORVASTATIN CALCIUM 40 MG PO TABS
40.0000 mg | ORAL_TABLET | Freq: Every day | ORAL | Status: DC
  Administered 2015-11-23: 40 mg via ORAL
  Filled 2015-11-23: qty 1

## 2015-11-23 MED ORDER — LEVOTHYROXINE SODIUM 75 MCG PO TABS
175.0000 ug | ORAL_TABLET | Freq: Every day | ORAL | Status: DC
  Administered 2015-11-24: 175 ug via ORAL
  Filled 2015-11-23: qty 1

## 2015-11-23 MED ORDER — IPRATROPIUM BROMIDE 0.02 % IN SOLN: 0.5000 mg | Freq: Four times a day (QID) | RESPIRATORY_TRACT | Status: DC | PRN

## 2015-11-23 MED ORDER — DABIGATRAN ETEXILATE MESYLATE 150 MG PO CAPS
150.0000 mg | ORAL_CAPSULE | Freq: Two times a day (BID) | ORAL | Status: DC
  Administered 2015-11-23 – 2015-11-24 (×2): 150 mg via ORAL
  Filled 2015-11-23 (×2): qty 1

## 2015-11-23 MED ORDER — INSULIN ASPART 100 UNIT/ML ~~LOC~~ SOLN: 0.0000 [IU] | Freq: Every day | SUBCUTANEOUS | Status: DC

## 2015-11-23 MED ORDER — MELATONIN-PYRIDOXINE ER 3-10 MG PO TBCR: 1.0000 | EXTENDED_RELEASE_TABLET | Freq: Every day | ORAL | Status: DC

## 2015-11-23 MED ORDER — BUDESONIDE 0.25 MG/2ML IN SUSP
0.2500 mg | Freq: Two times a day (BID) | RESPIRATORY_TRACT | Status: DC
  Administered 2015-11-23 – 2015-11-24 (×2): 0.25 mg via RESPIRATORY_TRACT
  Filled 2015-11-23 (×2): qty 2

## 2015-11-23 MED ORDER — LEVALBUTEROL HCL 0.63 MG/3ML IN NEBU: 0.6300 mg | INHALATION_SOLUTION | Freq: Four times a day (QID) | RESPIRATORY_TRACT | Status: DC | PRN

## 2015-11-23 MED ORDER — VITAMIN D 1000 UNITS PO TABS
2000.0000 [IU] | ORAL_TABLET | Freq: Every day | ORAL | Status: DC
  Administered 2015-11-23 – 2015-11-24 (×2): 2000 [IU] via ORAL
  Filled 2015-11-23 (×2): qty 2

## 2015-11-23 MED ORDER — ALPRAZOLAM 0.25 MG PO TABS: 0.2500 mg | ORAL_TABLET | Freq: Two times a day (BID) | ORAL | Status: DC | PRN

## 2015-11-23 MED ORDER — LIDOCAINE HCL (CARDIAC) 20 MG/ML IV SOLN
INTRAVENOUS | Status: DC | PRN
  Administered 2015-11-23: 20 mg via INTRAVENOUS

## 2015-11-23 MED ORDER — FUROSEMIDE 10 MG/ML IJ SOLN
INTRAMUSCULAR | Status: AC
  Filled 2015-11-23: qty 4

## 2015-11-23 NOTE — H&P (Signed)
History and Physical   Patient ID: [name] MRN: [id number], DOB/AGE: [birth year] 65 y.o. Date of Encounter: 11/23/2015  Primary Physician: Velna Hatchet, MD Primary Cardiologist: Dr Acie Fredrickson Electrophysiologist: Dr Rayann Heman   Chief Complaint:  SOB  HPI: [name] is a 65 y.o. male with a history of bioprosthetic AoVR and MAZE procedure 07/2015, HTN, nl cors 2014, PAH, atrial fib s/p ablation 2013 on Pradaxa, St Jude PPM, HLD, fibromyalgia. Had pleural effusion after surgery requiring thoracentesis.  07/06 Seen by Dr Acie Fredrickson with +[facility], Lasix had previously been decreased due to worsening renal function. + fatigue. Afib was persistent. Weight 206  07/10 Seen by Dr Rayann Heman, PPM functioning normally. Since pt 3 months out from surgery, DCCV scheduled as OP.  07/11 Dr Acie Fredrickson reviewed Chest CT, which showed mild edema, Lasix increased x 3 days   07/12 Dr Lucianne Lei Kerby Less saw, weight 207 lbs, Augmentin added for possible sinus infection  07/17 Dr Lamonte Sakai saw, weight 205 lbs  07/20 Cardiac rehab saw and pt wt 202 lbs.  07/27, pt came in for DCCV. It was successful, but pt was SOB and O2 sats were 80-82% on room air. He got Lasix 40 mg IV x 1 and pt admitted for diuresis.  [name] gets more SOB moving self around in the bed. His O2 sats are 96% on O2 at rest, he does not have home O2. He cannot walk to the BR without getting SOB. He has not noticed much LE edema. He does not weigh himself at home (but will start). He sleeps poorly, but with the recent URI, is unsure if that is from his underlying lung dz or worsening CHF. He describes orthopnea and possibly PND.   Past Medical History:  Diagnosis Date  . Aortic stenosis, mild   . Arthritis of hand    "just a little bit in both hands" (03/31/2012)  . Asthma    "little bit" (03/31/2012)  . Atrial fibrillation [facility])    dx '04; DCCV '04, placed on flecainide, failed DCCV 04/2010, flecainide stopped; s/p successful A.fib ablation  01/31/12  . Diabetes mellitus without complication (HCC)    borderline  . DJD (degenerative joint disease)   . Exertional dyspnea   . Fibromyalgia   . First degree AV block    post ablation, ~414ms  . GERD (gastroesophageal reflux disease)   . Heart murmur   . Hyperlipidemia   . Hypertension   . Hypothyroidism    S/P radiation  . Left atrial enlargement    LA size 71mm by echo 11/21/11  . Mitral regurgitation    trivial  . Obesity   . Obstructive sleep apnea    mild by sleep study 2013; pt stated he does not have a machine because "it wasn't bad enough for him to have one"  . Pacemaker 03/31/2012  . Restless leg syndrome   . Second degree AV block     Surgical History:  Past Surgical History:  Procedure Laterality Date  . AORTIC VALVE REPLACEMENT N/A 08/22/2015   Procedure: AORTIC VALVE REPLACEMENT (AVR);  Surgeon: Ivin Poot, MD;  Location: [place];  Service: Open Heart Surgery;  Laterality: N/A;  . ATRIAL FIBRILLATION ABLATION  01/30/2012   PVI by Dr. Rayann Heman  . ATRIAL FIBRILLATION ABLATION N/A 01/31/2012   Procedure: ATRIAL FIBRILLATION ABLATION;  Surgeon: Thompson Grayer, MD;  Location: [facility] CATH LAB;  Service: Cardiovascular;  Laterality: N/A;  . BACK SURGERY     X 3  .  CARDIAC CATHETERIZATION N/A 08/09/2015   Procedure: Right/Left Heart Cath and Coronary Angiography;  Surgeon: Peter M Martinique, MD;  Location: [place] CV LAB;  Service: Cardiovascular;  Laterality: N/A;  . CARDIOVASCULAR STRESS TEST  03/12/2002   EF 48%, NO EVIDENCE OF ISCHEMIA  . CARDIOVERSION  01/2012; 03/31/2012  . CARDIOVERSION N/A 02/25/2012   Procedure: CARDIOVERSION;  Surgeon: Thompson Grayer, MD;  Location: [facility] CATH LAB;  Service: Cardiovascular;  Laterality: N/A;  . CARDIOVERSION N/A 03/31/2012   Procedure: CARDIOVERSION;  Surgeon: Thompson Grayer, MD;  Location: [facility] CATH LAB;  Service: Cardiovascular;  Laterality: N/A;  . CLIPPING OF ATRIAL APPENDAGE N/A 08/22/2015   Procedure: CLIPPING OF ATRIAL APPENDAGE;   Surgeon: Ivin Poot, MD;  Location: [place];  Service: Open Heart Surgery;  Laterality: N/A;  . DOPPLER ECHOCARDIOGRAPHY  03/11/2002   EF 70-75%  . FINGER SURGERY Left    Middle finger  . FINGER TENDON REPAIR  1980's   "right little finger" (03/31/2012)  . INSERT / REPLACE / REMOVE PACEMAKER     St Jude  . LEFT AND RIGHT HEART CATHETERIZATION WITH CORONARY ANGIOGRAM N/A 10/27/2012   Procedure: LEFT AND RIGHT HEART CATHETERIZATION WITH CORONARY ANGIOGRAM;  Surgeon: Laverda Page, MD;  [facility] C[facility] LAB;  Service: Cardiovascular;  Laterality: N/A;  . LUMBAR DISC SURGERY  1980's X2;  2000's  . MAZE N/A 08/22/2015   Procedure: MAZE;  Surgeon: Ivin Poot, MD;  Location: [place];  Service: Open Heart Surgery;  Laterality: N/A;  . PACEMAKER INSERTION  03/31/2012   STJ Accent DR pacemaker implanted by Dr Rayann Heman  . PERMANENT PACEMAKER INSERTION N/A 03/31/2012   Procedure: PERMANENT PACEMAKER INSERTION;  Surgeon: Thompson Grayer, MD;  Location: [facility] CATH LAB;  Service: Cardiovascular;  Laterality: N/A;  . TEE WITHOUT CARDIOVERSION  01/30/2012   Procedure: TRANSESOPHAGEAL ECHOCARDIOGRAM (TEE);  Surgeon: Larey Dresser, MD;  Location: [place];  Service: Cardiovascular;  Laterality: N/A;  ablation next day  . TEE WITHOUT CARDIOVERSION N/A 08/22/2015   Procedure: TRANSESOPHAGEAL ECHOCARDIOGRAM (TEE);  Surgeon: Ivin Poot, MD;  Location: [place];  Service: Open Heart Surgery;  Laterality: N/A;  . US ECHOCARDIOGRAPHY  08/07/2009   EF 55-60%     I have reviewed the patient's current medications. Medication Sig  amLODipine (NORVASC) 10 MG tablet Take 0.5 tablets (5 mg total) by mouth daily.  atorvastatin (LIPITOR) 40 MG tablet Take 1 tablet (40 mg total) by mouth daily.  budesonide (PULMICORT) 0.25 MG/2ML nebulizer solution Take 2 mLs (0.25 mg total) by nebulization 2 (two) times daily.  budesonide-formoterol (SYMBICORT) 160-4.5 MCG/ACT inhaler Inhale 2 puffs into the lungs 2 (two) times daily.   chlorpheniramine-HYDROcodone (TUSSIONEX PENNKINETIC ER) 10-8 MG/5ML SUER Take 5 mLs by mouth every 12 (twelve) hours as needed for cough.  Cholecalciferol (VITAMIN D) 2000 UNITS CAPS Take 2,000 Units by mouth daily.  dabigatran (PRADAXA) 150 MG CAPS capsule Take 1 capsule (150 mg total) by mouth 2 (two) times daily.  fexofenadine (ALLEGRA) 180 MG tablet Take 1 tablet (180 mg total) by mouth daily.  fluticasone (FLONASE) 50 MCG/ACT nasal spray Place into both nostrils 2 (two) times daily as needed for allergies or rhinitis.  furosemide (LASIX) 40 MG tablet Take 1-2 tablets (40-80 mg total) by mouth daily.  gabapentin (NEURONTIN) 300 MG capsule Take 600 mg by mouth at bedtime.   ipratropium (ATROVENT) 0.02 % nebulizer solution Take 2.5 mLs (0.5 mg total) by nebulization 4 (four) times daily.  levalbuterol (XOPENEX) 0.63 MG/3ML  nebulizer solution Take 3 mLs (0.63 mg total) by nebulization every 6 (six) hours as needed for wheezing or shortness of breath.  levothyroxine (SYNTHROID, LEVOTHROID) 175 MCG tablet Take 175 mcg by mouth daily before breakfast.  Melatonin-Pyridoxine (MELATONEX) 3-10 MG TBCR Take 1 tablet by mouth at bedtime.  metFORMIN (GLUMETZA) 500 MG (MOD) 24 hr tablet Take 500 mg by mouth daily with breakfast.  metoprolol tartrate (LOPRESSOR) 25 MG tablet Take 1 tablet (25 mg total) by mouth 2 (two) times daily.  pantoprazole (PROTONIX) 40 MG tablet Take 40 mg by mouth daily.  promethazine (PHENERGAN) 25 MG tablet Take 25 mg by mouth every 6 (six) hours as needed for nausea or vomiting.  rOPINIRole (REQUIP) 1 MG tablet Take 1 tablet (1 mg total) by mouth daily.  spironolactone (ALDACTONE) 25 MG tablet Take 12.5 mg by mouth daily.   traMADol (ULTRAM) 50 MG tablet Take 1 tablet by mouth daily. Reported on 10/17/2015  valACYclovir (VALTREX) 1000 MG tablet Take 1 tablet by mouth every 12 (twelve) hours as needed (fever blisters). Reported on 10/17/2015   Scheduled Meds: . sodium chloride  flush  3 mL Intravenous Q12H  . sodium chloride flush  3 mL Intravenous Q12H   Continuous Infusions:  PRN Meds:.sodium chloride, sodium chloride, acetaminophen, ondansetron (ZOFRAN) IV, sodium chloride flush, sodium chloride flush  Allergies:  Allergies  Allergen Reactions  . Meloxicam Rash  . 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 syndrome 09/02/15, resolved with diphenhydramine and slowing of rate    Social History   Social History  . Marital status: Married    Spouse name: N/A  . Number of children: N/A  . Years of education: N/A   Occupational History  . Not on file.   Social History Main Topics  . Smoking status: Former Smoker    Packs/day: 0.50    Years: 15.00    Types: Cigarettes    Quit date: 07/18/1991  . Smokeless tobacco: Never Used  . Alcohol use Yes     Comment: occasional  . Drug use:     Types: Marijuana  . Sexual activity: Not Currently   Other Topics Concern  . Not on file   Social History Narrative   Lives in [place] with spouse.  Works as a Air traffic controller.    Family History  Problem Relation Age of Onset  . Heart failure Mother   . Stroke Father    Family Status  Relation Status  . Mother Deceased at age 64  . Father Deceased at age 33    Review of Systems:   Full 14-point review of systems otherwise negative except as noted above.  Physical Exam: Blood pressure (!) 151/80, pulse 84, temperature 98.4 F (36.9 C), temperature source Oral, resp. rate (!) 33, SpO2 93 %. General: Well developed, well nourished,male in moderate respiratory distress. Head: Normocephalic, atraumatic, sclera non-icteric, no xanthomas, nares are without discharge. Dentition: fair Neck: No carotid bruits. JVD 10 cm. No thyromegally Lungs: Good expansion bilaterally. without wheezes or rhonchi. Decreased BS bases w/ rales Heart: Slightly IRRegular rate and rhythm with S1 S2.  No S3 or S4.  2/6 murmur, no rubs, or gallops appreciated. Abdomen:  Soft, non-tender, non-distended with normoactive bowel sounds. No hepatomegaly. No rebound/guarding. No obvious abdominal masses. Msk:  Strength and tone appear normal for age. No joint deformities or effusions, no spine or costo-vertebral angle tenderness. Extremities: No clubbing or cyanosis. No edema.  Distal pedal pulses are 2+ in 4 extrem  Neuro: Alert and oriented X 3. Moves all extremities spontaneously. No focal deficits noted. Psych:  Responds to questions appropriately with a normal affect. Skin: No rashes or lesions noted  Labs:   Lab Results  Component Value Date   WBC 9.5 11/21/2015   HGB 9.3 (L) 11/21/2015   HCT 30.5 (L) 11/21/2015   MCV 82.9 11/21/2015   PLT 266 11/21/2015    Recent Labs  11/21/15 0928  INR 1.2*     Recent Labs Lab 11/21/15 0928  NA 136  K 3.6  CL 98  CO2 28  BUN 19  CREATININE 1.54*  CALCIUM 8.8  GLUCOSE 197*   Pro B Natriuretic peptide (BNP)  Date/Time Value Ref Range Status  11/07/2015 11:55 AM 178.0 (H) 0.0 - 100.0 pg/mL Final  09/02/2014 11:32 AM 329.0 (H) 0.0 - 100.0 pg/mL Final   Radiology/Studies: No results found.  Echo: 09/03/2015 - Left ventricle: The cavity size was normal. Wall thickness was   increased in a pattern of moderate LVH. Systolic function was   normal. The estimated ejection fraction was in the range of 60%   to 65%. Wall motion was normal; there were no regional wall   motion abnormalities. - Aortic valve: There was mild to moderate regurgitation. The AI VC   is 0.4 cm. Mean gradient (S): 15 mm Hg. - Mitral valve: There was mild regurgitation. - Left atrium: The atrium was moderately dilated. - Right atrium: The atrium was moderately dilated. - Pulmonary arteries: Systolic pressure was mildly increased. PA   peak pressure: 38 mm Hg (S).  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  I have reviewed the chart, notes and new data.  I agree with PA's note.  Key new complaints: feels a little better after some diuresis, but still has dyspnea at rest Key examination changes: no moist rales or wheezes on exam, but lung fields are very quiet, consistent with severe emphysema Key new findings / data: chest xray and echo pending. Note preop FEV1 was 50% of normal.  PLAN: There is probably a component of CHF, but I suspect the major source of his dyspnea is emphysema. The severity of lung disease is unexpected for his relatively young age and low overall tobacco exposure (8 pack years, quit 1990s). His father died of advanced emphysema and his daughter has "life-threatening" asthma.  Consider alpha-1 antitrypsin deficiency. Diuresis. Check echo since it will be more informative in regular rhythm and may give [place] insight into his left heart filling pressures.  Sanda Klein, MD, [place] [phone] 11/23/2015, 3:31 PM

## 2015-11-23 NOTE — Anesthesia Postprocedure Evaluation (Signed)
Anesthesia Post Note  Patient: [name]  Procedure(s) Performed: Procedure(s) (LRB): CARDIOVERSION (N/A)  Patient location during evaluation: Endoscopy Anesthesia Type: MAC Level of consciousness: awake, sedated, responds to stimulation and patient cooperative Pain management: pain level controlled Vital Signs Assessment: post-procedure vital signs reviewed and stable Respiratory status: spontaneous breathing Cardiovascular status: blood pressure returned to baseline Postop Assessment: no signs of nausea or vomiting    Last Vitals:  Vitals:   11/23/15 0904 11/23/15 0910  BP: (!) 173/90 (!) 180/71  Pulse:    Resp: (!) 30 (!) 29  Temp:  36.9 C    Last Pain:  Vitals:   11/23/15 0910  TempSrc: Oral                 Felicitas Sine

## 2015-11-23 NOTE — Interval H&P Note (Signed)
History and Physical Interval Note:  11/23/2015 8:51 AM  [name]  has presented today for surgery, with the diagnosis of AFIB  The various methods of treatment have been discussed with the patient and family. After consideration of risks, benefits and other options for treatment, the patient has consented to  Procedure(s): CARDIOVERSION (N/A) as a surgical intervention .  The patient's history has been reviewed, patient examined, no change in status, stable for surgery.  I have reviewed the patient's chart and labs.  Questions were answered to the patient's satisfaction.     [place] [employer]

## 2015-11-23 NOTE — H&P (View-Only) (Signed)
Electrophysiology Office Note   Date:  11/06/2015   ID:  [name], DOB [birth year], MRN [id number]  PCP:  Velna Hatchet, MD  Cardiologist:  Dr Acie Fredrickson Primary Electrophysiologist: Thompson Grayer, MD    Chief Complaint  Patient presents with  . Atrial Fibrillation     History of Present Illness: [name] is a 65 y.o. male who presents today for electrophysiology evaluation.   Has chronic SOB and edema.  He is s/p aortic valve replacement with LAA clip and MAZE 4/17.  He was in sinus for a week but quickly returned to afib.  He has not had subsequent cardioversion.  His primary concern remains with chronic SOB.  Today, he denies symptoms of palpitations, chest pain,  orthopnea, PND, claudication, dizziness, presyncope, syncope, bleeding, or neurologic sequela. The patient is tolerating medications without difficulties and is otherwise without complaint today.    Past Medical History  Diagnosis Date  . Hypertension   . Hyperlipidemia   . Fibromyalgia   . Obesity   . Mitral regurgitation     trivial  . Aortic stenosis, mild   . Left atrial enlargement     LA size 47mm by echo 11/21/11  . Pacemaker 03/31/2012  . Heart murmur   . Asthma     "little bit" (03/31/2012)  . Exertional dyspnea   . Hypothyroidism     S/P radiation  . DJD (degenerative joint disease)   . Arthritis of hand     "just a little bit in both hands" (03/31/2012)  . Atrial fibrillation [facility])     dx '04; DCCV '04, placed on flecainide, failed DCCV 04/2010, flecainide stopped; s/p successful A.fib ablation 01/31/12  . First degree AV block     post ablation, ~455ms  . Second degree AV block   . GERD (gastroesophageal reflux disease)   . Restless leg syndrome   . Obstructive sleep apnea     mild by sleep study 2013; pt stated he does not have a machine because "it wasn't bad enough for him to have one"  . Diabetes mellitus without complication ([place])     borderline   Past Surgical History    Procedure Laterality Date  . Finger tendon repair  1980's    "right little finger" (03/31/2012)  . US echocardiography  08/07/2009    EF 55-60%  . Doppler echocardiography  03/11/2002    EF 70-75%  . Cardiovascular stress test  03/12/2002    EF 48%, NO EVIDENCE OF ISCHEMIA  . Tee without cardioversion  01/30/2012    Procedure: TRANSESOPHAGEAL ECHOCARDIOGRAM (TEE);  Surgeon: Larey Dresser, MD;  Location: [place];  Service: Cardiovascular;  Laterality: N/A;  ablation next day  . Pacemaker insertion  03/31/2012    STJ Accent DR pacemaker implanted by Dr Rayann Heman  . Atrial fibrillation ablation  01/30/2012    PVI by Dr. Rayann Heman  . Lumbar disc surgery  1980's X2;  2000's  . Cardioversion  01/2012; 03/31/2012  . Atrial fibrillation ablation N/A 01/31/2012    Procedure: ATRIAL FIBRILLATION ABLATION;  Surgeon: Thompson Grayer, MD;  Location: [facility] CATH LAB;  Service: Cardiovascular;  Laterality: N/A;  . Cardioversion N/A 02/25/2012    Procedure: CARDIOVERSION;  Surgeon: Thompson Grayer, MD;  Location: [facility] CATH LAB;  Service: Cardiovascular;  Laterality: N/A;  . Permanent pacemaker insertion N/A 03/31/2012    Procedure: PERMANENT PACEMAKER INSERTION;  Surgeon: Thompson Grayer, MD;  Location: [facility] CATH LAB;  Service: Cardiovascular;  Laterality: N/A;  . Cardioversion  N/A 03/31/2012    Procedure: CARDIOVERSION;  Surgeon: Thompson Grayer, MD;  Location: [facility] CATH LAB;  Service: Cardiovascular;  Laterality: N/A;  . Left and right heart catheterization with coronary angiogram N/A 10/27/2012    Procedure: LEFT AND RIGHT HEART CATHETERIZATION WITH CORONARY ANGIOGRAM;  Surgeon: Laverda Page, MD;  Location: [facility] CATH LAB;  Service: Cardiovascular;  Laterality: N/A;  . Cardiac catheterization N/A 08/09/2015    Procedure: Right/Left Heart Cath and Coronary Angiography;  Surgeon: Peter M Martinique, MD;  Location: [place] CV LAB;  Service: Cardiovascular;  Laterality: N/A;  . Back surgery      X 3  . Finger surgery Left      Middle finger  . Insert / replace / remove pacemaker      St Jude  . Aortic valve replacement N/A 08/22/2015    Procedure: AORTIC VALVE REPLACEMENT (AVR);  Surgeon: Ivin Poot, MD;  Location: [place];  Service: Open Heart Surgery;  Laterality: N/A;  . Maze N/A 08/22/2015    Procedure: MAZE;  Surgeon: Ivin Poot, MD;  Location: [place];  Service: Open Heart Surgery;  Laterality: N/A;  . Clipping of atrial appendage N/A 08/22/2015    Procedure: CLIPPING OF ATRIAL APPENDAGE;  Surgeon: Ivin Poot, MD;  Location: [place];  Service: Open Heart Surgery;  Laterality: N/A;  . Tee without cardioversion N/A 08/22/2015    Procedure: TRANSESOPHAGEAL ECHOCARDIOGRAM (TEE);  Surgeon: Ivin Poot, MD;  Location: [place];  Service: Open Heart Surgery;  Laterality: N/A;     Current Outpatient Prescriptions  Medication Sig Dispense Refill  . amLODipine (NORVASC) 10 MG tablet Take 0.5 tablets (5 mg total) by mouth daily. 30 tablet 11  . atorvastatin (LIPITOR) 40 MG tablet Take 1 tablet (40 mg total) by mouth daily. 90 tablet 1  . budesonide (PULMICORT) 0.25 MG/2ML nebulizer solution Take 2 mLs (0.25 mg total) by nebulization 2 (two) times daily. 360 mL 1  . Cholecalciferol (VITAMIN D) 2000 UNITS CAPS Take 2,000 Units by mouth daily.    . dabigatran (PRADAXA) 150 MG CAPS capsule Take 1 capsule (150 mg total) by mouth 2 (two) times daily. 60 capsule 1  . fexofenadine (ALLEGRA) 180 MG tablet Take 1 tablet (180 mg total) by mouth daily. 30 tablet 1  . fluticasone (FLONASE) 50 MCG/ACT nasal spray Place into both nostrils 2 (two) times daily as needed for allergies or rhinitis.    . furosemide (LASIX) 40 MG tablet Take 1-2 tablets (40-80 mg total) by mouth daily. 120 tablet 3  . gabapentin (NEURONTIN) 300 MG capsule Take 600 mg by mouth at bedtime.   3  . ipratropium (ATROVENT) 0.02 % nebulizer solution Take 2.5 mLs (0.5 mg total) by nebulization 4 (four) times daily. 300 mL 1  . levalbuterol (XOPENEX) 0.63  MG/3ML nebulizer solution Take 3 mLs (0.63 mg total) by nebulization every 6 (six) hours as needed for wheezing or shortness of breath. 360 mL 1  . levothyroxine (SYNTHROID, LEVOTHROID) 175 MCG tablet Take 175 mcg by mouth daily before breakfast.    . Melatonin-Pyridoxine (MELATONEX) 3-10 MG TBCR Take 1 tablet by mouth at bedtime.    . metoprolol tartrate (LOPRESSOR) 25 MG tablet Take 1 tablet (25 mg total) by mouth 2 (two) times daily. 60 tablet 3  . pantoprazole (PROTONIX) 40 MG tablet Take 40 mg by mouth daily.    . promethazine (PHENERGAN) 25 MG tablet Take 25 mg by mouth every 6 (six) hours as needed for  nausea or vomiting.    [name] rOPINIRole (REQUIP) 1 MG tablet Take 1 tablet (1 mg total) by mouth daily. 90 tablet 2  . spironolactone (ALDACTONE) 25 MG tablet Take 12.5 mg by mouth daily.     . traMADol (ULTRAM) 50 MG tablet Take 1 tablet by mouth daily. Reported on 10/17/2015    . valACYclovir (VALTREX) 1000 MG tablet Take 1 tablet by mouth every 12 (twelve) hours as needed (fever blisters). Reported on 10/17/2015     No current facility-administered medications for this visit.    Allergies:   Meloxicam and Vancomycin   Social History:  The patient  reports that he quit smoking about 24 years ago. His smoking use included Cigarettes. He has a 7.5 pack-year smoking history. He has never used smokeless tobacco. He reports that he drinks alcohol. He reports that he uses illicit drugs (Marijuana).   Family History:  The patient's family history includes Heart failure in his mother; Stroke in his father.    ROS:  Please see the history of present illness.   All other systems are reviewed and negative.    PHYSICAL EXAM: VS:  BP 118/59 mmHg  Pulse 76  Ht 5\' 11"  (1.803 m)  Wt 205 lb 3.2 oz (93.078 kg)  BMI 28.63 kg/m2  SpO2 97% , BMI Body mass index is 28.63 kg/(m^2). GEN: Well nourished, well developed, in no acute distress HEENT: normal Neck: no JVD, carotid bruits, or masses Cardiac: RRR  (paced)  Respiratory:  Coarse BS at bases, diffuse expiratory wheezes, normal work of breathing GI: soft, nontender, nondistended, + BS MS: no deformity or atrophy Skin: warm and dry, device pocket is well healed Neuro:  Strength and sensation are intact Psych: euthymic mood, full affect  Device interrogation is reviewed today in detail.  See PaceArt for details.   Recent Labs: 08/23/2015: Magnesium 2.4 09/02/2015: ALT 28; B Natriuretic Peptide 206.2* 09/04/2015: TSH 0.441 09/09/2015: Hemoglobin 9.7*; Platelets 342 09/18/2015: BUN 34*; Creat 1.64*; Potassium 4.0; Sodium 136    Lipid Panel     Component Value Date/Time   CHOL 114* 08/03/2015 1036   TRIG 111 08/03/2015 1036   HDL 30* 08/03/2015 1036   CHOLHDL 3.8 08/03/2015 1036   VLDL 22 08/03/2015 1036   LDLCALC 62 08/03/2015 1036     Wt Readings from Last 3 Encounters:  11/06/15 205 lb 3.2 oz (93.078 kg)  11/02/15 206 lb 12.8 oz (93.804 kg)  10/19/15 206 lb (93.441 kg)      Other studies Reviewed: Additional studies/ records that were reviewed today include: Dr Acie Fredrickson notes , OP notes   ASSESSMENT AND PLAN:   1. Second degree AV block Normal pacemaker function See Pace Art report  2. SOB Quite wheezy on exam, would likely benefit from pulmonary follow-up  3. afib Remains on pradaxa S/p recent MAZE and LAA clipping Given now 3 months post MAZE, it is probably reasonable to reconsider cardioversion.  I will forward note to Dr Prescott Gum to see if he would like for [place] to proceed.  4. htn Stable  Return in 1 year for follow-up Follow-up with Dr Acie Fredrickson as scheduled  [place]   Current medicines are reviewed at length with the patient today.   The patient does not have concerns regarding his medicines.  The following changes were made today:  none  Signed, Thompson Grayer, MD  11/06/2015 12:26 PM     [place] [address] [phone] (office) [phone]  (  fax)

## 2015-11-23 NOTE — Progress Notes (Signed)
Patients O2 sats down to 80-82 on room air after cardioversion. Placed patient back on [facility] sats up to 94-95. Patient took 40 mg PO lasix that he had with him after D/C instructions were reviewed with patient.  Dr. Aundra Dubin was notified of the change in O2 status and IV lasix was ordered and patient will be admitted for further observation. Pt and family verbalized understanding.  Lasix 40 mg Iv given as ordered. [facility]

## 2015-11-23 NOTE — Progress Notes (Signed)
PHARMACIST - PHYSICIAN ORDER COMMUNICATION  CONCERNING: P&T Medication Policy on Herbal Medications  DESCRIPTION:  This patient's order for:  Melatonin-Pyridoxine ER  has been noted.  This product(s) is classified as an "herbal" or natural product. Due to a lack of definitive safety studies or FDA approval, nonstandard manufacturing practices, plus the potential risk of unknown drug-drug interactions while on inpatient medications, the Pharmacy and Therapeutics Committee does not permit the use of "herbal" or natural products of this type within [facility].   ACTION TAKEN: The pharmacy department is unable to verify this order at this time and your patient has been informed of this safety policy. Please reevaluate patient's clinical condition at discharge and address if the herbal or natural product(s) should be resumed at that time.

## 2015-11-23 NOTE — Anesthesia Preprocedure Evaluation (Addendum)
Anesthesia Evaluation  Patient identified by MRN, date of birth, ID band Patient awake    Reviewed: Allergy & Precautions, NPO status , Patient's Chart, lab work & pertinent test results, reviewed documented beta blocker date and time   Airway Mallampati: II  TM Distance: >3 FB Neck ROM: Full    Dental  (+) Missing, Dental Advisory Given, Poor Dentition, Chipped   Pulmonary former smoker,    breath sounds clear to auscultation       Cardiovascular  Rhythm:Irregular Rate:Normal     Neuro/Psych    GI/Hepatic   Endo/Other  diabetes, Well Controlled, Type 2  Renal/GU      Musculoskeletal   Abdominal   Peds  Hematology   Anesthesia Other Findings   Reproductive/Obstetrics                            Anesthesia Physical Anesthesia Plan  ASA: III  Anesthesia Plan: General   Post-op Pain Management:    Induction: Intravenous  Airway Management Planned: Mask  Additional Equipment:   Intra-op Plan:   Post-operative Plan:   Informed Consent: I have reviewed the patients History and Physical, chart, labs and discussed the procedure including the risks, benefits and alternatives for the proposed anesthesia with the patient or authorized representative who has indicated his/her understanding and acceptance.     Plan Discussed with: CRNA and Anesthesiologist  Anesthesia Plan Comments:         Anesthesia Quick Evaluation

## 2015-11-23 NOTE — Progress Notes (Signed)
Patient admitted to Endo unit with c/o of SOB. Patient placed on monitor with an oxygen sat of 84-85% on room air. Patient placed on 3 liters of O2 and sats stable at 98-99%. MD made aware, ok to precede with cardioversion as scheduled.

## 2015-11-23 NOTE — Procedures (Signed)
Electrical Cardioversion Procedure Note [name] [id number] 04-Mar-1951  Procedure: Electrical Cardioversion Indications:  Atrial Fibrillation.  He has been on Pradaxa without missing doses.   Procedure Details Consent: Risks of procedure as well as the alternatives and risks of each were explained to the (patient/caregiver).  Consent for procedure obtained. Time Out: Verified patient identification, verified procedure, site/side was marked, verified correct patient position, special equipment/implants available, medications/allergies/relevent history reviewed, required imaging and test results available.  Performed  Patient placed on cardiac monitor, pulse oximetry, supplemental oxygen as necessary.  Sedation given: Propofol per anesthesiology Pacer pads placed anterior and posterior chest.  Cardioverted 1 time(s).  Cardioverted at [place].  Evaluation Findings: Post procedure EKG shows: NSR with a few short atrial tachycardia runs. St Jude pacemaker was checked and functioning correctly post-procedure.  Complications: None Patient did tolerate procedure well.   Loralie Champagne 11/23/2015, 8:58 AM

## 2015-11-23 NOTE — Transfer of Care (Signed)
Immediate Anesthesia Transfer of Care Note  Patient: [name]  Procedure(s) Performed: Procedure(s): CARDIOVERSION (N/A)  Patient Location: PACU  Anesthesia Type:MAC  Level of Consciousness: sedated  Airway & Oxygen Therapy: Patient Spontanous Breathing and Patient connected to face mask oxygen  Post-op Assessment: Report given to RN and Post -op Vital signs reviewed and stable  Post vital signs: Reviewed and stable  Last Vitals:  Vitals:   11/23/15 0904 11/23/15 0910  BP: (!) 173/90 (!) 180/71  Pulse:    Resp: (!) 30 (!) 29  Temp:  36.9 C    Last Pain:  Vitals:   11/23/15 0910  TempSrc: Oral         Complications: No apparent anesthesia complications

## 2015-11-23 NOTE — Anesthesia Postprocedure Evaluation (Signed)
Anesthesia Post Note  Patient: [name]  Procedure(s) Performed: Procedure(s) (LRB): CARDIOVERSION (N/A)  Patient location during evaluation: Endoscopy Anesthesia Type: General Level of consciousness: awake, awake and alert and oriented Pain management: pain level controlled Vital Signs Assessment: post-procedure vital signs reviewed and stable Respiratory status: spontaneous breathing, nonlabored ventilation and respiratory function stable Cardiovascular status: blood pressure returned to baseline Anesthetic complications: no    Last Vitals:  Vitals:   11/23/15 1020 11/23/15 1057  BP: (!) 171/73 (!) 151/80  Pulse: 84 84  Resp: (!) 21 (!) 33  Temp:  36.9 C    Last Pain:  Vitals:   11/23/15 1057  TempSrc: Oral                 JOSLIN,DAVID COKER

## 2015-11-24 ENCOUNTER — Inpatient Hospital Stay (HOSPITAL_COMMUNITY): Payer: Medicare Other

## 2015-11-24 ENCOUNTER — Other Ambulatory Visit: Payer: Self-pay | Admitting: Physician Assistant

## 2015-11-24 ENCOUNTER — Encounter (HOSPITAL_COMMUNITY): Payer: Self-pay | Admitting: Cardiology

## 2015-11-24 ENCOUNTER — Encounter (HOSPITAL_COMMUNITY): Admission: RE | Admit: 2015-11-24 | Payer: Medicare Other | Source: Ambulatory Visit

## 2015-11-24 DIAGNOSIS — I5033 Acute on chronic diastolic (congestive) heart failure: Secondary | ICD-10-CM

## 2015-11-24 DIAGNOSIS — I481 Persistent atrial fibrillation: Secondary | ICD-10-CM

## 2015-11-24 DIAGNOSIS — I509 Heart failure, unspecified: Secondary | ICD-10-CM

## 2015-11-24 LAB — HEPATIC FUNCTION PANEL
ALT: 17 U/L (ref 17–63)
AST: 20 U/L (ref 15–41)
Albumin: 3.3 g/dL — ABNORMAL LOW (ref 3.5–5.0)
Alkaline Phosphatase: 92 U/L (ref 38–126)
Bilirubin, Direct: 0.4 mg/dL (ref 0.1–0.5)
Indirect Bilirubin: 0.7 mg/dL (ref 0.3–0.9)
Total Bilirubin: 1.1 mg/dL (ref 0.3–1.2)
Total Protein: 6.4 g/dL — ABNORMAL LOW (ref 6.5–8.1)

## 2015-11-24 LAB — BASIC METABOLIC PANEL
Anion gap: 12 (ref 5–15)
BUN: 16 mg/dL (ref 6–20)
CO2: 31 mmol/L (ref 22–32)
Calcium: 9.1 mg/dL (ref 8.9–10.3)
Chloride: 94 mmol/L — ABNORMAL LOW (ref 101–111)
Creatinine, Ser: 1.41 mg/dL — ABNORMAL HIGH (ref 0.61–1.24)
GFR calc Af Amer: 58 mL/min — ABNORMAL LOW (ref >60)
GFR calc non Af Amer: 50 mL/min — ABNORMAL LOW (ref >60)
Glucose, Bld: 118 mg/dL — ABNORMAL HIGH (ref 65–99)
Potassium: 3.4 mmol/L — ABNORMAL LOW (ref 3.5–5.1)
Sodium: 137 mmol/L (ref 135–145)

## 2015-11-24 LAB — GLUCOSE, CAPILLARY
Glucose-Capillary: 114 mg/dL — ABNORMAL HIGH (ref 65–99)
Glucose-Capillary: 118 mg/dL — ABNORMAL HIGH (ref 65–99)
Glucose-Capillary: 136 mg/dL — ABNORMAL HIGH (ref 65–99)

## 2015-11-24 LAB — HEMOGLOBIN A1C
Hgb A1c MFr Bld: 5.9 % — ABNORMAL HIGH (ref 4.8–5.6)
Mean Plasma Glucose: 123 mg/dL

## 2015-11-24 LAB — ALPHA-1-ANTITRYPSIN: A-1 Antitrypsin, Ser: 137 mg/dL (ref 90–200)

## 2015-11-24 LAB — ECHOCARDIOGRAM COMPLETE
Height: 71 in
Weight: 3153.6 oz

## 2015-11-24 MED ORDER — POTASSIUM CHLORIDE ER 10 MEQ PO TBCR
30.0000 meq | EXTENDED_RELEASE_TABLET | Freq: Two times a day (BID) | ORAL | Status: DC
  Administered 2015-11-24: 30 meq via ORAL
  Filled 2015-11-24 (×2): qty 3

## 2015-11-24 NOTE — Progress Notes (Signed)
Echocardiogram 2D Echocardiogram has been performed.  [name] 11/24/2015, 9:15 AM

## 2015-11-24 NOTE — Discharge Summary (Signed)
Discharge Summary    Patient ID: [name],  MRN: [id number], DOB/AGE: 1950-12-27 65 y.o.  Admit date: 11/23/2015 Discharge date: 11/24/2015  Primary Care Provider: Velna Hatchet Primary Cardiologist: Dr Acie Fredrickson, Dr Allred  Discharge Diagnoses    Principal Problem:   Acute respiratory failure with hypoxia [facility]) Active Problems:   Hypertension   Pulmonary HTN ([place])   S/P aortic valve replacement with bioprosthetic valve   Acute on chronic diastolic CHF (congestive heart failure), NYHA class 4 (HCC)   CHF, acute on chronic (HCC)   Allergies Allergies  Allergen Reactions  . Meloxicam Rash  . 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 syndrome 09/02/15, resolved with diphenhydramine and slowing of rate    Diagnostic Studies/Procedures    CXR - results below  ECHO: 11/24/2015 - Left ventricle: The cavity size was normal. Wall thickness was   increased in a pattern of mild LVH. Systolic function was normal.   The estimated ejection fraction was in the range of 60% to 65%.   Indeterminant diastolic function. Wall motion was normal; there   were no regional wall motion abnormalities. - Ventricular septum: Respirophasic variation of the   interventricular septum was noted (respirometry was not done however). - Aortic valve: Bioprosthetic aortic valve. No bioprosthetic aortic   valve stenosis. There was trivial regurgitation. Mean gradient (S): 10 mm Hg. - Mitral valve: Mildly calcified annulus. There was mild regurgitation. - Left atrium: The atrium was mildly dilated. - Right ventricle: The cavity size was mildly dilated. Pacer wire   or catheter noted in right ventricle. Systolic function was   mildly reduced. - Right atrium: The atrium was mildly to moderately dilated. - Pulmonary arteries: No complete TR doppler jet so unable to   estimate PA systolic pressure. - Inferior vena cava: The vessel was normal in size. The   respirophasic diameter changes were in  the normal range (>= 50%),   consistent with normal central venous pressure. Impressions: - Normal LV size with mild LV hypertrophy. EF 60-65%. Mildly   dilated RV with mildly decreased systolic function. There   appeared to be respirophasic variation of the interventricular   septum. This can be seen with percardial constriction, but the   pericardium did not appear thickened and the IVC was not   enlarged. The bioprosthetic aortic valve appeared to function   normally. _____________   History of Present Illness     65 y.o. male with a history of bioprosthetic AoVR and MAZE procedure 07/2015, HTN, nl cors 2014, PAH, atrial fib s/p ablation 2013 on Pradaxa, St Jude PPM, HLD, fibromyalgia. Had pleural effusion after surgery requiring thoracentesis. He was in atrial fib and having problems with SOB. DCCV was scheduled for 07/27.  Hospital Course     Consultants: None   He had the cardioversion without initial complication. However, after the procedure, he became SOB and was in heart failure by exam and CXR. Oxygen saturation was 80% on room air. He was started on O2. He was admitted for diuresis.  He was diuresed with IV Lasix and lost 7 lbs over the course of his stay. As he diuresed, his breathing improved. His dry weight is felt to be less than 200 lbs and he will monitor this at home. His potassium required supplementation and this was done.   Because of the persistent problems with CHF, an echo was repeated to make sure the valve was functioning well and pressures were ok. Results are  above, it all looked good.   On 07/28, Mr Strommer was ambulating and his oxygen levels were staying above 90% with ambulation. He was feeling much improved. It was felt his volume could be managed as an outpatient. No further inpatient workup was indicated and he was considered stable for discharge, to follow up as an outpatient.  _____________  Discharge Vitals Blood pressure 124/61, pulse 82,  temperature 98 F (36.7 C), temperature source Oral, resp. rate 16, height 5\' 11"  (1.803 m), weight 197 lb 1.6 oz (89.4 kg), SpO2 (!) 88 %.  Filed Weights   11/23/15 1709 11/24/15 0429  Weight: 204 lb 8 oz (92.8 kg) 197 lb 1.6 oz (89.4 kg)    Labs & Radiologic Studies   Basic Metabolic Panel  Recent Labs  11/23/15 1442 11/24/15 0421  NA 139 137  K 4.1 3.4*  CL 101 94*  CO2 28 31  GLUCOSE 152* 118*  BUN 16 16  CREATININE 1.56* 1.41*  CALCIUM 9.1 9.1   Liver Function Tests  Recent Labs  11/24/15 0421  AST 20  ALT 17  ALKPHOS 92  BILITOT 1.1  PROT 6.4*  ALBUMIN 3.3*   Hemoglobin A1C  Recent Labs  11/23/15 1432  HGBA1C 5.9*  _____________  Dg Chest 2 View Result Date: 11/23/2015 CLINICAL DATA:  eval for continued SOB, CHF - recent heart shock for irregular heartbeat, Has had SOB since - hx of asthma, diabetes, htn, pacemaker, AFIB, sleep apnea, other heart issues EXAM: CHEST  2 VIEW COMPARISON:  CT, 11/07/2015.  Chest radiograph, 622/17. FINDINGS: Changes from cardiac surgery and aortic valve replacement are stable. Cardiac silhouette is normal in size. No mediastinal or hilar masses or evidence of adenopathy. There is vascular congestion with irregular interstitial thickening. Small pleural effusions are noted and there is thickening of the fissures. Subtle hazy airspace opacity projects lateral to the left hilum and in the left lower lung. No pneumothorax. Left anterior chest wall sequential pacemaker is stable and well positioned. Bony thorax is demineralized but intact. IMPRESSION: Findings support congestive heart failure with mild pulmonary edema and small pleural effusions. Electronically Signed   By: Lajean Manes M.D.   On: 11/23/2015 15:18  Disposition   Pt is being discharged home today in good condition.  Follow-up Plans & Appointments    Follow-up Information    Mertie Moores, MD Follow up on 12/15/2015.   Specialty:  Cardiology Why:  See MD at 1:30 pm,  please arrive 15 minutes early for paperwork. Contact information: [address] [address] [phone]        Collene Gobble., MD .   Specialty:  Pulmonary Disease Why:  As scheduled Contact information: [address] [place] [address] [phone]          Discharge Instructions    (HEART FAILURE PATIENTS) Call MD:  Anytime you have any of the following symptoms: 1) 3 pound weight gain in 24 hours or 5 pounds in 1 week 2) shortness of breath, with or without a dry hacking cough 3) swelling in the hands, feet or stomach 4) if you have to sleep on extra pillows at night in order to breathe.    Complete by:  As directed   Diet - low sodium heart healthy    Complete by:  As directed   Increase activity slowly    Complete by:  As directed      Discharge Medications   Current Discharge Medication List  CONTINUE these medications which have NOT CHANGED   Details  amLODipine (NORVASC) 10 MG tablet Take 0.5 tablets (5 mg total) by mouth daily. Qty: 30 tablet, Refills: 11    atorvastatin (LIPITOR) 40 MG tablet Take 1 tablet (40 mg total) by mouth daily. Qty: 90 tablet, Refills: 1    budesonide (PULMICORT) 0.25 MG/2ML nebulizer solution Take 2 mLs (0.25 mg total) by nebulization 2 (two) times daily. Qty: 360 mL, Refills: 1    budesonide-formoterol (SYMBICORT) 160-4.5 MCG/ACT inhaler Inhale 2 puffs into the lungs 2 (two) times daily. Qty: 1 Inhaler, Refills: 6    Cholecalciferol (VITAMIN D) 2000 UNITS CAPS Take 2,000 Units by mouth daily.    dabigatran (PRADAXA) 150 MG CAPS capsule Take 1 capsule (150 mg total) by mouth 2 (two) times daily. Qty: 60 capsule, Refills: 1    diphenhydrAMINE (BENADRYL) 25 MG tablet Take 25 mg by mouth at bedtime.    fexofenadine (ALLEGRA) 180 MG tablet Take 1 tablet (180 mg total) by mouth daily. Qty: 30 tablet, Refills: 1    fluticasone (FLONASE) 50 MCG/ACT nasal spray Place into both nostrils 2 (two)  times daily as needed for allergies or rhinitis.    furosemide (LASIX) 40 MG tablet Take 1-2 tablets (40-80 mg total) by mouth daily. Qty: 120 tablet, Refills: 3    gabapentin (NEURONTIN) 300 MG capsule Take 600 mg by mouth at bedtime.  Refills: 3    guaiFENesin (MUCINEX) 600 MG 12 hr tablet Take 600 mg by mouth 2 (two) times daily.    ipratropium (ATROVENT) 0.02 % nebulizer solution Take 2.5 mLs (0.5 mg total) by nebulization 4 (four) times daily. Qty: 300 mL, Refills: 1    levalbuterol (XOPENEX) 0.63 MG/3ML nebulizer solution Take 3 mLs (0.63 mg total) by nebulization every 6 (six) hours as needed for wheezing or shortness of breath. Qty: 360 mL, Refills: 1    levothyroxine (SYNTHROID, LEVOTHROID) 175 MCG tablet Take 175 mcg by mouth daily before breakfast.    Melatonin-Pyridoxine (MELATONEX) 3-10 MG TBCR Take 1 tablet by mouth at bedtime.    metFORMIN (GLUMETZA) 500 MG (MOD) 24 hr tablet Take 500 mg by mouth daily with breakfast.    pantoprazole (PROTONIX) 40 MG tablet Take 40 mg by mouth daily.    promethazine (PHENERGAN) 25 MG tablet Take 25 mg by mouth every 6 (six) hours as needed for nausea or vomiting.    rOPINIRole (REQUIP) 1 MG tablet Take 1 tablet (1 mg total) by mouth daily. Qty: 90 tablet, Refills: 2    spironolactone (ALDACTONE) 25 MG tablet Take 12.5 mg by mouth daily.     traMADol (ULTRAM) 50 MG tablet Take 1 tablet by mouth daily. Reported on 10/17/2015    valACYclovir (VALTREX) 1000 MG tablet Take 1 tablet by mouth every 12 (twelve) hours as needed (fever blisters). Reported on 10/17/2015    metoprolol tartrate (LOPRESSOR) 25 MG tablet Take 1 tablet (25 mg total) by mouth 2 (two) times daily. Qty: 60 tablet, Refills: 3      STOP taking these medications     chlorpheniramine-HYDROcodone (TUSSIONEX PENNKINETIC ER) 10-8 MG/5ML SUER           Outstanding Labs/Studies  None  Duration of Discharge Encounter   Greater than 30 minutes including physician  time.  Jonetta Speak NP 11/24/2015, 5:02 PM

## 2015-11-24 NOTE — Progress Notes (Signed)
Pt walked with RN around the nursing station ( approx 150-200 ft). Pt tolerated it well. Pt's O2 sats ranged from 88-91 walking. Pt ranged from 94-100 % on RA while sitting. Hoover Brunette, RN

## 2015-11-24 NOTE — Discharge Instructions (Signed)
Electrical Cardioversion, Care After Refer to this sheet in the next few weeks. These instructions provide you with information on caring for yourself after your procedure. Your health care provider may also give you more specific instructions. Your treatment has been planned according to current medical practices, but problems sometimes occur. Call your health care provider if you have any problems or questions after your procedure. WHAT TO EXPECT AFTER THE PROCEDURE After your procedure, it is typical to have the following sensations:  Some redness on the skin where the shocks were delivered. If this is tender, a sunburn lotion or hydrocortisone cream may help.  Possible return of an abnormal heart rhythm within hours or days after the procedure. HOME CARE INSTRUCTIONS  Take medicines only as directed by your health care provider. Be sure you understand how and when to take your medicine.  Learn how to feel your pulse and check it often.  Limit your activity for 48 hours after the procedure or as directed by your health care provider.  Avoid or minimize caffeine and other stimulants as directed by your health care provider. SEEK MEDICAL CARE IF:  You feel like your heart is beating too fast or your pulse is not regular.  You have any questions about your medicines.  You have bleeding that will not stop. SEEK IMMEDIATE MEDICAL CARE IF:  You are dizzy or feel faint.  It is hard to breathe or you feel short of breath.  There is a change in discomfort in your chest.  Your speech is slurred or you have trouble moving an arm or leg on one side of your body.  You get a serious muscle cramp that does not go away.  Your fingers or toes turn cold or blue.   This information is not intended to replace advice given to you by your health care provider. Make sure you discuss any questions you have with your health care provider.    Please take Lasix 80 mg in the morning and 40 mg in the  evening for 4 days, then 80 mg daily.   Document Released: 02/03/2013 Document Revised: 05/06/2014 Document Reviewed: 02/03/2013 Elsevier Interactive Patient Education 2016 [employer].  -------------------------------------------------------------------------------------------------------------------------------------------------------------------------------------------------  Information on my medicine - Pradaxa (dabigatran)  This medication education was reviewed with me or my healthcare representative as part of my discharge preparation.  The pharmacist that spoke with me during my hospital stay was:  Arty Baumgartner, [facility]  Why was Pradaxa prescribed for you? Pradaxa was prescribed for you to reduce the risk of forming blood clots that cause a stroke if you have a medical condition called atrial fibrillation (a type of irregular heartbeat).    What do you Need to know about PradAXa? Take your Pradaxa TWICE DAILY - one capsule in the morning and one tablet in the evening with or without food.  It would be best to take the doses about the same time each day.  The capsules should not be broken, chewed or opened - they must be swallowed whole.  Do not store Pradaxa in other medication containers - once the bottle is opened the Pradaxa should be used within FOUR months; throw away any capsules that havent been by that time.  Take Pradaxa exactly as prescribed by your doctor.  DO NOT stop taking Pradaxa without talking to the doctor who prescribed the medication.  Stopping without other stroke prevention medication to take the place of Pradaxa may increase your risk of developing a clot that  causes a stroke.  Refill your prescription before you run out.  After discharge, you should have regular check-up appointments with your healthcare provider that is prescribing your Pradaxa.  In the future your dose may need to be changed if your kidney function or weight changes by a  significant amount.  What do you do if you miss a dose? If you miss a dose, take it as soon as you remember on the same day.  If your next dose is less than 6 hours away, skip the missed dose.  Do not take two doses of PRADAXA at the same time.  Important Safety Information A possible side effect of Pradaxa is bleeding. You should call your healthcare provider right away if you experience any of the following: ? Bleeding from an injury or your nose that does not stop. ? Unusual colored urine (red or dark brown) or unusual colored stools (red or black). ? Unusual bruising for unknown reasons. ? A serious fall or if you hit your head (even if there is no bleeding).  Some medicines may interact with Pradaxa and might increase your risk of bleeding or clotting while on Pradaxa. To help avoid this, consult your healthcare provider or pharmacist prior to using any new prescription or non-prescription medications, including herbals, vitamins, non-steroidal anti-inflammatory drugs (NSAIDs) and supplements.  This website has more information on Pradaxa (dabigatran): [email or web address]://www.pradaxa.com

## 2015-11-24 NOTE — Care Management Important Message (Signed)
Important Message  Patient Details  Name: [name] MRN: [id number] Date of Birth: [birth year]   Medicare Important Message Given:  Yes    Loann Quill 11/24/2015, 10:39 AM

## 2015-11-24 NOTE — Care Management Note (Signed)
Case Management Note  Patient Details  Name: [name] MRN: [id number] Date of Birth: Mar 21, 1951  Subjective/Objective: Pt presented for DCCV- post procedure became SOB with 02 sats ranging from 80-82 % RA. Pt initiated on 02 and IV lasix. Pt is from home with wife and no DME at home.                     Action/Plan: Pt will need to be ambulated to see if qualifies for home 02 once stable for d/c. Pt states he will not need HH Services at d/c. CM will continue to monitor.    Expected Discharge Date:                  Expected Discharge Plan:  [place]  In-House Referral:     Discharge planning Services  CM Consult  Post Acute Care Choice:    Choice offered to:     DME Arranged:    DME Agency:     HH Arranged:    [place] Agency:     Status of Service:  In process, will continue to follow  If discussed at Long Length of Stay Meetings, dates discussed:    Additional Comments:  Bethena Roys, RN 11/24/2015, 2:25 PM

## 2015-11-24 NOTE — Progress Notes (Signed)
Patient Name: [name] Date of Encounter: 11/24/2015  Principal Problem:   Acute respiratory failure with hypoxia ([place]) Active Problems:   Hypertension   Pulmonary HTN (HCC)   S/P aortic valve replacement with bioprosthetic valve   Acute on chronic diastolic CHF (congestive heart failure), NYHA class 4 (HCC)   CHF, acute on chronic ([place])   Length of Stay: 1  SUBJECTIVE  Feels better than yesterday, but not back to baseline. Still requires O2 supplement to maintain O2 sat at rest. Alpha-1 antitrypsin normal. Remains in regular rhythm. Echo being performed right now. EF normal. Seems to have Doppler signs of elevated mean LA pressure (mitral flow E-A fused, but pulmonary vein flow diastolic dominant). Negative fluid balance 1.6L, weight down 7 lb (admit weight probably not accurate?)  CURRENT MEDS . atorvastatin  40 mg Oral q1800  . budesonide  0.25 mg Nebulization BID  . cholecalciferol  2,000 Units Oral Daily  . dabigatran  150 mg Oral BID  . furosemide  40 mg Intravenous BID  . gabapentin  600 mg Oral QHS  . insulin aspart  0-15 Units Subcutaneous TID WC  . insulin aspart  0-5 Units Subcutaneous QHS  . levothyroxine  175 mcg Oral QAC breakfast  . loratadine  10 mg Oral Daily  . metoprolol tartrate  25 mg Oral BID  . pantoprazole  40 mg Oral Daily  . rOPINIRole  1 mg Oral Daily  . sodium chloride flush  3 mL Intravenous Q12H  . spironolactone  12.5 mg Oral Daily  . traMADol  50 mg Oral Daily    OBJECTIVE   Intake/Output Summary (Last 24 hours) at 11/24/15 0855 Last data filed at 11/24/15 0600  Gross per 24 hour  Intake              920 ml  Output             2550 ml  Net            -1630 ml   Filed Weights   11/23/15 1709 11/24/15 0429  Weight: 92.8 kg (204 lb 8 oz) 89.4 kg (197 lb 1.6 oz)    PHYSICAL EXAM Vitals:   11/23/15 1954 11/24/15 0429 11/24/15 0726 11/24/15 0833  BP:  122/61 (!) 143/66   Pulse:  83 80   Resp:  (!) 23 16   Temp:  97.9 F  (36.6 C)    TempSrc:  Oral    SpO2: 99% 98% 100% 100%  Weight:  89.4 kg (197 lb 1.6 oz)    Height:       General: Alert, oriented x3, no distress Head: no evidence of trauma, PERRL, EOMI, no exophtalmos or lid lag, no myxedema, no xanthelasma; normal ears, nose and oropharynx Neck: normal jugular venous pulsations and no hepatojugular reflux; brisk carotid pulses without delay and no carotid bruits Chest: clear to auscultation, no signs of consolidation by percussion or palpation, normal fremitus, symmetrical and full respiratory excursions Cardiovascular: normal position and quality of the apical impulse, regular rhythm, normal first and second heart sounds, no rubs or gallops, 2/6 aortic ejection murmur, no diastolic murmur Abdomen: no tenderness or distention, no masses by palpation, no abnormal pulsatility or arterial bruits, normal bowel sounds, no hepatosplenomegaly Extremities: no clubbing, cyanosis or edema; 2+ radial, ulnar and brachial pulses bilaterally; 2+ right femoral, posterior tibial and dorsalis pedis pulses; 2+ left femoral, posterior tibial and dorsalis pedis pulses; no subclavian or femoral bruits Neurological: grossly nonfocal  LABS  CBC  Recent Labs  11/21/15 0928  WBC 9.5  NEUTROABS 7,505  HGB 9.3*  HCT 30.5*  MCV 82.9  PLT 123456   Basic Metabolic Panel  Recent Labs  11/23/15 1442 11/24/15 0421  NA 139 137  K 4.1 3.4*  CL 101 94*  CO2 28 31  GLUCOSE 152* 118*  BUN 16 16  CREATININE 1.56* 1.41*  CALCIUM 9.1 9.1   Liver Function Tests  Recent Labs  11/24/15 0421  AST 20  ALT 17  ALKPHOS 92  BILITOT 1.1  PROT 6.4*  ALBUMIN 3.3*   No results for input(s): LIPASE, AMYLASE in the last 72 hours. Cardiac Enzymes No results for input(s): CKTOTAL, CKMB, CKMBINDEX, TROPONINI in the last 72 hours. BNP Invalid input(s): POCBNP D-Dimer No results for input(s): DDIMER in the last 72 hours. Hemoglobin A1C  Recent Labs  11/23/15 1432  HGBA1C  5.9*   Fasting Lipid Panel No results for input(s): CHOL, HDL, LDLCALC, TRIG, CHOLHDL, LDLDIRECT in the last 72 hours. Thyroid Function Tests No results for input(s): TSH, T4TOTAL, T3FREE, THYROIDAB in the last 72 hours.  Invalid input(s): [place]  Radiology Studies Imaging results have been reviewed and Dg Chest 2 View  Result Date: 11/23/2015 CLINICAL DATA:  eval for continued SOB, CHF - recent heart shock for irregular heartbeat, Has had SOB since - hx of asthma, diabetes, htn, pacemaker, AFIB, sleep apnea, other heart issues EXAM: CHEST  2 VIEW COMPARISON:  CT, 11/07/2015.  Chest radiograph, 622/17. FINDINGS: Changes from cardiac surgery and aortic valve replacement are stable. Cardiac silhouette is normal in size. No mediastinal or hilar masses or evidence of adenopathy. There is vascular congestion with irregular interstitial thickening. Small pleural effusions are noted and there is thickening of the fissures. Subtle hazy airspace opacity projects lateral to the left hilum and in the left lower lung. No pneumothorax. Left anterior chest wall sequential pacemaker is stable and well positioned. Bony thorax is demineralized but intact. IMPRESSION: Findings support congestive heart failure with mild pulmonary edema and small pleural effusions. Electronically Signed   By: Lajean Manes M.D.   On: 11/23/2015 15:18   TELE A paced V sensed rhythm   ASSESSMENT AND PLAN [name] is a 65 y.o. male with a history of bioprosthetic AoVR and MAZE procedure 07/2015, HTN, nl cors 2014, PAH, atrial fib s/p ablation 2013 on Pradaxa, St Jude PPM, HLD, fibromyalgia. Had pleural effusion after surgery requiring thoracentesis. DCCV for persistent atrial fibrillation on 7/27 was followed by dyspnea/hypoxia and admitted for CHF exacerbation. Preop PFTs showed FEV1 and FVC approx 50% of expected.  1. Acute on chronic diastolic HF - continue diuresis 2. Persistent atrial fibrillation after successful DCCV  7/27 3. S/P AVR bioprosthesis 4. Dual chamber pacemaker (SJM) 5. 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 May need another full day of diuretics.   Sanda Klein, MD, [facility] [phone] office [phone] pager 11/24/2015 8:55 AM

## 2015-11-27 ENCOUNTER — Telehealth (HOSPITAL_COMMUNITY): Payer: Self-pay | Admitting: *Deleted

## 2015-11-27 ENCOUNTER — Encounter (HOSPITAL_COMMUNITY): Admission: RE | Admit: 2015-11-27 | Payer: Medicare Other | Source: Ambulatory Visit

## 2015-11-27 NOTE — Telephone Encounter (Signed)
-----   Message from Thayer Headings, MD sent at 11/27/2015  3:16 PM EDT ----- Regarding: RE: Ok to resume cardiac rehab Pt may restart cardiac rehab if he feels up to it    ----- Message ----- From: Rowe Pavy, RN Sent: 11/27/2015   9:08 AM To: Thayer Headings, MD Subject: Ok to resume cardiac rehab                     Accutane is discussed fully with the patient. It is a very effective drug to treat acne vulgaris but has many significant side effects. Chief among these are teratogenesis, hepatic injury, dyslipidemia and severe drying of the mucous membranes. All of these issues have been discussed in details. Monthly blood tests to monitor lipids and liver functions will be necessary. Expect painful dryness and/or fissuring around the lips, eyes, and other moist areas of the body. Balms may be protective. Contact lens may be too painful to wear temporarily while on this drug. Episodes of significant depression have been reported, including suicidal ideation and attempts in rare cases. It may also cause pseudotumor cerebri and hyperostosis. The patient will report any such changes in mood, depressive symptoms or suicidal thoughts, headaches, joint or bone pains.  Dr. Acie Fredrickson,  The above pt is on hold to for exercise at cardiac rehab due to upper respiratory issues seen by you, heart surgeon and [place] ).  Pt also had cardioversion on 7/27 due to afib.  Pt did have to stay overnight in the hospital due to  Sob, low o2 sat (heart failure).  Pt d/c'd on 7/28.  When may pt return back to cardiac rehab ?  Thanks for your advisement  Maurice Small RN

## 2015-11-27 NOTE — Telephone Encounter (Signed)
This note was entered in error  J. Shirl Harris, MD 11/27/2015, 5:05 AM [facility] Pager [phone] After 3 pm or if no answer, call [phone]

## 2015-11-28 ENCOUNTER — Telehealth: Payer: Self-pay | Admitting: Cardiovascular Disease

## 2015-11-28 NOTE — Telephone Encounter (Signed)
Follow-up    The wife is calling to see if the pt needs to come in for blood work or not

## 2015-11-28 NOTE — Telephone Encounter (Signed)
Spoke with patient's wife and she will bring him on Mon for repeat BMP

## 2015-11-29 ENCOUNTER — Encounter (HOSPITAL_COMMUNITY): Payer: Medicare Other

## 2015-12-01 ENCOUNTER — Encounter (HOSPITAL_COMMUNITY): Payer: Medicare Other

## 2015-12-04 ENCOUNTER — Other Ambulatory Visit (INDEPENDENT_AMBULATORY_CARE_PROVIDER_SITE_OTHER): Payer: Medicare Other

## 2015-12-04 ENCOUNTER — Encounter (HOSPITAL_COMMUNITY)
Admission: RE | Admit: 2015-12-04 | Discharge: 2015-12-04 | Disposition: A | Discharge status: Home / Self Care | Payer: Medicare Other | Source: Ambulatory Visit | Attending: Cardiovascular Disease | Admitting: Cardiovascular Disease

## 2015-12-04 DIAGNOSIS — Z9889 Other specified postprocedural states: Secondary | ICD-10-CM | POA: Insufficient documentation

## 2015-12-04 DIAGNOSIS — Z954 Presence of other heart-valve replacement: Secondary | ICD-10-CM | POA: Insufficient documentation

## 2015-12-04 DIAGNOSIS — Z952 Presence of prosthetic heart valve: Secondary | ICD-10-CM

## 2015-12-04 DIAGNOSIS — Z79899 Other long term (current) drug therapy: Secondary | ICD-10-CM | POA: Insufficient documentation

## 2015-12-04 DIAGNOSIS — I441 Atrioventricular block, second degree: Secondary | ICD-10-CM | POA: Insufficient documentation

## 2015-12-04 DIAGNOSIS — I350 Nonrheumatic aortic (valve) stenosis: Secondary | ICD-10-CM | POA: Insufficient documentation

## 2015-12-04 DIAGNOSIS — K219 Gastro-esophageal reflux disease without esophagitis: Secondary | ICD-10-CM | POA: Insufficient documentation

## 2015-12-04 DIAGNOSIS — I5033 Acute on chronic diastolic (congestive) heart failure: Secondary | ICD-10-CM | POA: Diagnosis not present

## 2015-12-04 DIAGNOSIS — I340 Nonrheumatic mitral (valve) insufficiency: Secondary | ICD-10-CM | POA: Insufficient documentation

## 2015-12-04 DIAGNOSIS — Z8679 Personal history of other diseases of the circulatory system: Secondary | ICD-10-CM | POA: Insufficient documentation

## 2015-12-04 LAB — BASIC METABOLIC PANEL
BUN: 19 mg/dL (ref 7–25)
CO2: 28 mmol/L (ref 20–31)
Calcium: 8.9 mg/dL (ref 8.6–10.3)
Chloride: 97 mmol/L — ABNORMAL LOW (ref 98–110)
Creat: 1.44 mg/dL — ABNORMAL HIGH (ref 0.70–1.25)
Glucose, Bld: 105 mg/dL — ABNORMAL HIGH (ref 65–99)
Potassium: 3.4 mmol/L — ABNORMAL LOW (ref 3.5–5.3)
Sodium: 137 mmol/L (ref 135–146)

## 2015-12-06 ENCOUNTER — Ambulatory Visit (INDEPENDENT_AMBULATORY_CARE_PROVIDER_SITE_OTHER): Payer: Medicare Other | Admitting: Emergency Medicine

## 2015-12-06 ENCOUNTER — Telehealth: Payer: Self-pay | Admitting: Cardiovascular Disease

## 2015-12-06 ENCOUNTER — Encounter (HOSPITAL_COMMUNITY): Payer: Medicare Other

## 2015-12-06 ENCOUNTER — Encounter: Payer: Self-pay | Admitting: Emergency Medicine

## 2015-12-06 DIAGNOSIS — R05 Cough: Secondary | ICD-10-CM

## 2015-12-06 DIAGNOSIS — J4520 Mild intermittent asthma, uncomplicated: Secondary | ICD-10-CM | POA: Diagnosis not present

## 2015-12-06 DIAGNOSIS — R059 Cough, unspecified: Secondary | ICD-10-CM

## 2015-12-06 NOTE — Assessment & Plan Note (Signed)
We will increase protonix to 40mg  twice a day for two weeks, then go back to daily. See if this increase helps you cough.  Please continue allegra Please start using your flonase nasal spray, 2 sprays each nostril daily every day

## 2015-12-06 NOTE — Patient Instructions (Addendum)
We will increase protonix to 40mg  twice a day for two weeks, then go back to daily. See if this increase helps you cough.  Please continue allegra Please start using your flonase nasal spray, 2 sprays each nostril daily every day Continue your symbicort twice a day .  Do not restart pulmicort or atrovent nebs.  Use albuterol nebs as needed for shortness of breath.  Follow with Dr Lamonte Sakai in 3 months or sooner if you have any problems.

## 2015-12-06 NOTE — Assessment & Plan Note (Signed)
Continue your symbicort twice a day .  Do not restart pulmicort or atrovent nebs.  Use albuterol nebs as needed for shortness of breath.  Follow with Dr Lamonte Sakai in 3 months or sooner if you have any problems.

## 2015-12-06 NOTE — Progress Notes (Signed)
Subjective:    Patient ID: [name], male    DOB: [birth year], 65 y.o.   MRN: [id number]  HPI 65 yo man, former smoker (7-8pk-yrs), hx obesity, HTN, mild AS, A Fib s/p ablation and rate control, mild OSA (by PSG '13), asthma dx several yrs ago by Dr Minna Antis. Has been experiencing exertional dyspnea for 2-3 yrs. He notices dyspnea with climbing a hill. He snores, moves his arms and legs continuously through the night. He has lost 20 lbs over last few months which has helped some.   Eval has included PFT that show mixed disease with a definite obstructive component based on a response to bronchodilator 08/05/13. Diffusion corrected for alveolar volume. CPST and L/R caths as below.   ROV 09/20/14 -- seen by Dr Gwenette Greet in 4/'16 and then TP in early May. At that time was treated for bronchitis w doxy. He is on symbicort bid, uses SABA about 5x a day. He is on fluticasone and allegra. His largest persistent sx are exertional dyspnea. He has mild OSA by PSG 2013. He is having frequent cough, bad at night. Significant nasal congestion. Over the 3 months he has had early satiety, stable wt.   11/06/15 -- -[name] is a 65 year old with past medical history of asthma and CAD. Quit smoking in 1993.  He is here for urgent follow up.  He had valve surgery in April 2017. OR went well but had SOB shortly after and they found fluid in lungs. Was diuresced and got better. Not back at baseline though.  He has been SOB x 5 weeks. Got ZPAK and Pred end of June. He finished his prednisone a week ago.  Over all, he feels some better but is still SOB with ADLs, not as bad as 5 weeks ago. He feels prednsione did NOT make a whole lot of difference.  Not on O2.  Has snoring. No recent sleep study.  His weight has been the same.  He saw cardiologist today and was told he has wheezing and needed pulm f/u.   ROV 11/13/15 --   66 year old man. Minimal tobacco history, COPD based on this and also probable fixed  asthma. He has frequent bouts of bronchitic symptoms superimposed on his chronic cough. Also with a history of coronary disease, atrial fibrillation status post ablation, and an aortic valve replacement for aortic stenosis with Maze procedure and left atrial clipping. He has LVH with ejection fraction of approximately 50%. He has been treated with antibiotics and prednisone since his hospitalization for the valve replacement. His biggest problems have been persistent cough, wheezing and dyspnea. He is unable to lay flat, sleeps in a recliner. He has been recently seen by Dr. Murlean Iba, underwent Ct chest 7/11 that showed scattered diffuse GGI and a small right effusion, treated with an increase in his Lasix to 80 mg a day for 5 days. He has also seen thoracic surgery and was treated with Augmentin and Tussionex. His bronchodilator regimen includes Atrovent nebulizers 4 times a day, Pulmicort nebulizer 2 times a day, Xopenex nebulizer when necessary which he uses rarely. He states that his cough and mucus have improved significantly as has his dyspnea.   ROV 12/06/15 -- 65 year old gentleman with a history of COPD/asthma also with significant cardiac disease that includes aortic valve replacement, atrial fibrillation post Maze procedure. Secondary pulmonary hypertension. He had been experiencing progressive dyspnea And was hospitalized 7/27 for DC cardioversion from atrial fibrillation. He had stay in the hospital for aggressive  diuresis.  His breathing improved with these maneuvers. He has a lot of chest congestion, a lot of cough, worst at night when he sleeps in a recliner. He is using symbicort bid, on allegra qd, flonase prn. protonix 40mg  - was formerly on bid, currently on qd. No evidence that the symbicort is contributing to the cough.     Review of Systems  Constitutional: Negative.   HENT: Positive for congestion.   Respiratory: Positive for cough and shortness of breath. Negative for chest tightness  and wheezing.   Cardiovascular: Negative.   Gastrointestinal: Negative.   Endocrine: Negative.   Genitourinary: Negative.   Musculoskeletal: Negative.   Allergic/Immunologic: Negative.   Hematological: Negative.   All other systems reviewed and are negative.        Past Medical History:  Diagnosis Date  . Aortic stenosis, mild   . Arthritis of hand    "just a little bit in both hands" (03/31/2012)  . Asthma    "little bit" (03/31/2012)  . Atrial fibrillation [facility])    dx '04; DCCV '04, placed on flecainide, failed DCCV 04/2010, flecainide stopped; s/p successful A.fib ablation 01/31/12  . CHF (congestive heart failure) ([place])   . Diabetes mellitus without complication (HCC)    borderline  . DJD (degenerative joint disease)   . Exertional dyspnea   . Fibromyalgia   . First degree AV block    post ablation, ~496ms  . GERD (gastroesophageal reflux disease)   . Heart murmur   . Hyperlipidemia   . Hypertension   . Hypothyroidism    S/P radiation  . Left atrial enlargement    LA size 71mm by echo 11/21/11  . Mitral regurgitation    trivial  . Obesity   . Obstructive sleep apnea    mild by sleep study 2013; pt stated he does not have a machine because "it wasn't bad enough for him to have one"  . Pacemaker 03/31/2012  . Restless leg syndrome   . Second degree AV block      Family History  Problem Relation Age of Onset  . Heart failure Mother   . Stroke Father      Past Surgical History:  Procedure Laterality Date  . AORTIC VALVE REPLACEMENT N/A 08/22/2015   Procedure: AORTIC VALVE REPLACEMENT (AVR);  Surgeon: Ivin Poot, MD;  Location: [place];  Service: Open Heart Surgery;  Laterality: N/A;  . ATRIAL FIBRILLATION ABLATION  01/30/2012   PVI by Dr. Rayann Heman  . ATRIAL FIBRILLATION ABLATION N/A 01/31/2012   Procedure: ATRIAL FIBRILLATION ABLATION;  Surgeon: Thompson Grayer, MD;  Location: [facility] CATH LAB;  Service: Cardiovascular;  Laterality: N/A;  . BACK SURGERY     X 3  .  CARDIAC CATHETERIZATION N/A 08/09/2015   Procedure: Right/Left Heart Cath and Coronary Angiography;  Surgeon: Peter M Martinique, MD;  Location: [place] CV LAB;  Service: Cardiovascular;  Laterality: N/A;  . CARDIOVASCULAR STRESS TEST  03/12/2002   EF 48%, NO EVIDENCE OF ISCHEMIA  . CARDIOVERSION  01/2012; 03/31/2012  . CARDIOVERSION N/A 02/25/2012   Procedure: CARDIOVERSION;  Surgeon: Thompson Grayer, MD;  Location[facility] CATH LAB;  Service: Cardiovascular;  Laterality: N/A;  . CARDIOVERSION N/A 03/31/2012   Procedure: CARDIOVERSION;  Surgeon: Thompson Grayer, MD;  Location: [facility] CATH LAB;  Service: Cardiovascular;  Laterality: N/A;  . CARDIOVERSION N/A 11/23/2015   Procedure: CARDIOVERSION;  Surgeon: Larey Dresser, MD;  Location: [place];  Service: Cardiovascular;  Laterality: N/A;  . CLIPPING OF ATRIAL APPENDAGE  N/A 08/22/2015   Procedure: CLIPPING OF ATRIAL APPENDAGE;  Surgeon: Ivin Poot, MD;  Location: [place];  Service: Open Heart Surgery;  Laterality: N/A;  . DOPPLER ECHOCARDIOGRAPHY  03/11/2002   EF 70-75%  . FINGER SURGERY Left    Middle finger  . FINGER TENDON REPAIR  1980's   "right little finger" (03/31/2012)  . INSERT / REPLACE / REMOVE PACEMAKER     [employer]  . LEFT AND RIGHT HEART CATHETERIZATION WITH CORONARY ANGIOGRAM N/A 10/27/2012   Procedure: LEFT AND RIGHT HEART CATHETERIZATION WITH CORONARY ANGIOGRAM;  Surgeon: Laverda Page, MD;  Location: [facility] CATH LAB;  Service: Cardiovascular;  Laterality: N/A;  . LUMBAR DISC SURGERY  1980's X2;  2000's  . MAZE N/A 08/22/2015   Procedure: MAZE;  Surgeon: Ivin Poot, MD;  Location: [place];  Service: Open Heart Surgery;  Laterality: N/A;  . PACEMAKER INSERTION  03/31/2012   STJ Accent DR pacemaker implanted by Dr Rayann Heman  . PERMANENT PACEMAKER INSERTION N/A 03/31/2012   Procedure: PERMANENT PACEMAKER INSERTION;  Surgeon: Thompson Grayer, MD;  Location: [facility] CATH LAB;  Service: Cardiovascular;  Laterality: N/A;  . TEE WITHOUT CARDIOVERSION   01/30/2012   Procedure: TRANSESOPHAGEAL ECHOCARDIOGRAM (TEE);  Surgeon: Larey Dresser, MD;  Location: [place];  Service: Cardiovascular;  Laterality: N/A;  ablation next day  . TEE WITHOUT CARDIOVERSION N/A 08/22/2015   Procedure: TRANSESOPHAGEAL ECHOCARDIOGRAM (TEE);  Surgeon: Ivin Poot, MD;  Location: [place];  Service: Open Heart Surgery;  Laterality: N/A;  . US ECHOCARDIOGRAPHY  08/07/2009   EF 55-60%    Social History   Social History  . Marital status: Married    Spouse name: N/A  . Number of children: N/A  . Years of education: N/A   Occupational History  . Not on file.   Social History Main Topics  . Smoking status: Former Smoker    Packs/day: 0.50    Years: 15.00    Types: Cigarettes    Quit date: 07/18/1991  . Smokeless tobacco: Never Used  . Alcohol use Yes     Comment: occasional  . Drug use:     Types: Marijuana  . Sexual activity: Not Currently   Other Topics Concern  . Not on file   Social History Narrative   Lives in [place] with spouse.  Works as a Air traffic controller.     Allergies  Allergen Reactions  . Meloxicam Rash  . 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 syndrome 09/02/15, resolved with diphenhydramine and slowing of rate     Outpatient Medications Prior to Visit  Medication Sig Dispense Refill  . amLODipine (NORVASC) 10 MG tablet Take 0.5 tablets (5 mg total) by mouth daily. 30 tablet 11  . atorvastatin (LIPITOR) 40 MG tablet Take 1 tablet (40 mg total) by mouth daily. (Patient taking differently: Take 40 mg by mouth daily at 6 PM. ) 90 tablet 1  . budesonide (PULMICORT) 0.25 MG/2ML nebulizer solution Take 2 mLs (0.25 mg total) by nebulization 2 (two) times daily. (Patient taking differently: Take 0.25 mg by nebulization 2 (two) times daily as needed (for SOB). ) 360 mL 1  . budesonide-formoterol (SYMBICORT) 160-4.5 MCG/ACT inhaler Inhale 2 puffs into the lungs 2 (two) times daily. 1 Inhaler 6  . Cholecalciferol (VITAMIN D)  2000 UNITS CAPS Take 2,000 Units by mouth daily.    . dabigatran (PRADAXA) 150 MG CAPS capsule Take 1 capsule (150 mg total) by mouth 2 (two) times  daily. 60 capsule 1  . diphenhydrAMINE (BENADRYL) 25 MG tablet Take 25 mg by mouth at bedtime.    . fexofenadine (ALLEGRA) 180 MG tablet Take 1 tablet (180 mg total) by mouth daily. 30 tablet 1  . fluticasone (FLONASE) 50 MCG/ACT nasal spray Place into both nostrils 2 (two) times daily as needed for allergies or rhinitis.    . furosemide (LASIX) 40 MG tablet Take 1-2 tablets (40-80 mg total) by mouth daily. (Patient taking differently: Take 40 mg by mouth 2 (two) times daily. ) 120 tablet 3  . gabapentin (NEURONTIN) 300 MG capsule Take 600 mg by mouth at bedtime.   3  . guaiFENesin (MUCINEX) 600 MG 12 hr tablet Take 600 mg by mouth 2 (two) times daily.    [name] ipratropium (ATROVENT) 0.02 % nebulizer solution Take 2.5 mLs (0.5 mg total) by nebulization 4 (four) times daily. 300 mL 1  . levalbuterol (XOPENEX) 0.63 MG/3ML nebulizer solution Take 3 mLs (0.63 mg total) by nebulization every 6 (six) hours as needed for wheezing or shortness of breath. (Patient taking differently: Take 0.63 mg by nebulization 2 (two) times daily as needed for wheezing or shortness of breath. ) 360 mL 1  . levothyroxine (SYNTHROID, LEVOTHROID) 175 MCG tablet Take 175 mcg by mouth daily before breakfast.    . Melatonin-Pyridoxine (MELATONEX) 3-10 MG TBCR Take 1 tablet by mouth at bedtime.    . metFORMIN (GLUMETZA) 500 MG (MOD) 24 hr tablet Take 500 mg by mouth daily with breakfast.    . metoprolol tartrate (LOPRESSOR) 25 MG tablet Take 1 tablet (25 mg total) by mouth 2 (two) times daily. 60 tablet 3  . pantoprazole (PROTONIX) 40 MG tablet Take 40 mg by mouth daily.    . promethazine (PHENERGAN) 25 MG tablet Take 25 mg by mouth every 6 (six) hours as needed for nausea or vomiting.    [name] rOPINIRole (REQUIP) 1 MG tablet Take 1 tablet (1 mg total) by mouth daily. 90 tablet 2  .  spironolactone (ALDACTONE) 25 MG tablet Take 12.5 mg by mouth daily.     . traMADol (ULTRAM) 50 MG tablet Take 1 tablet by mouth daily. Reported on 10/17/2015    . valACYclovir (VALTREX) 1000 MG tablet Take 1 tablet by mouth every 12 (twelve) hours as needed (fever blisters). Reported on 10/17/2015     No facility-administered medications prior to visit.    No orders of the defined types were placed in this encounter.      Objective:   Physical Exam  Vitals:  Vitals:   12/06/15 1501  BP: 122/68  Pulse: 76  SpO2: 98%  Weight: 209 lb (94.8 kg)  Height: 5\' 11"  (1.803 m)    Body mass index is 29.15 kg/m. Wt Readings from Last 3 Encounters:  12/06/15 209 lb (94.8 kg)  11/24/15 197 lb 1.6 oz (89.4 kg)  11/13/15 205 lb (93 kg)   Gen: Pleasant, well-nourished, in no distress,  normal affect  ENT: No lesions,  mouth clear,  oropharynx clear, no postnasal drip  Neck: No JVD, no TMG, no carotid bruits  Lungs: No use of accessory muscles, no dullness to percussion, clear without rales or rhonchi  Cardiovascular: RRR, heart sounds normal, no murmur or gallops, no peripheral edema  Musculoskeletal: No deformities, no cyanosis or clubbing  Neuro: alert, non focal  Skin: Warm, no lesions or rashes            Assessment & Plan:  Intrinsic asthma Continue your symbicort  twice a day .  Do not restart pulmicort or atrovent nebs.  Use albuterol nebs as needed for shortness of breath.  Follow with Dr Lamonte Sakai in 3 months or sooner if you have any problems.  Cough We will increase protonix to 40mg  twice a day for two weeks, then go back to daily. See if this increase helps you cough.  Please continue allegra Please start using your flonase nasal spray, 2 sprays each nostril daily every day    Baltazar Apo, MD, PhD 12/06/2015, 3:43 PM [facility] [phone] or if no answer [phone]

## 2015-12-06 NOTE — Telephone Encounter (Signed)
New message   Pt wife is calling she verbalized that she is returning call for [facility] for lab results

## 2015-12-06 NOTE — Telephone Encounter (Signed)
-----   Message from Erma Heritage, [place] sent at 12/05/2015 12:39 PM EDT ----- Please let the patient know his kidney function is stable. Potassium is slightly abnormal, consistent with values at time of hospital discharge. Please encourage him to incorporate extra K+ into his diet (bananas, sweet potatoes, beans, spinach).  Should have repeat BMET at time of office visit with Dr. Acie Fredrickson next week.

## 2015-12-06 NOTE — Telephone Encounter (Signed)
Pt wife, [place], [place] on file, has been made aware of pt lab results and has been advised to incorporate high potassium foods in his diet.  We will repeat bmet on 12/15/15 @ time of o/v with Dr. Acie Fredrickson...  She is agreeable with this plan.

## 2015-12-08 ENCOUNTER — Encounter (HOSPITAL_COMMUNITY): Admission: RE | Admit: 2015-12-08 | Payer: Medicare Other | Source: Ambulatory Visit

## 2015-12-11 ENCOUNTER — Encounter (HOSPITAL_COMMUNITY): Admission: RE | Admit: 2015-12-11 | Payer: Medicare Other | Source: Ambulatory Visit

## 2015-12-13 ENCOUNTER — Encounter (HOSPITAL_COMMUNITY): Payer: Self-pay | Admitting: *Deleted

## 2015-12-13 ENCOUNTER — Encounter (HOSPITAL_COMMUNITY): Payer: Medicare Other

## 2015-12-14 ENCOUNTER — Ambulatory Visit (HOSPITAL_COMMUNITY): Payer: Self-pay | Admitting: *Deleted

## 2015-12-14 NOTE — Progress Notes (Signed)
Cardiac Individual Treatment Plan  Patient Details  Name: [name] MRN: [id number] Date of Birth: 1951-03-19 Referring Provider:   Flowsheet Row CARDIAC REHAB PHASE II EXERCISE from 10/25/2015 in [facility] [place]  Referring Provider  Mertie Moores MD      Initial Encounter Date:  [place] PHASE II EXERCISE from 10/25/2015 in [place]  Date  10/17/15  Referring Provider  Mertie Moores MD      Visit Diagnosis: No diagnosis found.  Patient's Home Medications on Admission:  Current Outpatient Prescriptions:  .  amLODipine (NORVASC) 10 MG tablet, Take 0.5 tablets (5 mg total) by mouth daily., Disp: 30 tablet, Rfl: 11 .  atorvastatin (LIPITOR) 40 MG tablet, Take 1 tablet (40 mg total) by mouth daily. (Patient taking differently: Take 40 mg by mouth daily at 6 PM. ), Disp: 90 tablet, Rfl: 1 .  budesonide (PULMICORT) 0.25 MG/2ML nebulizer solution, Take 2 mLs (0.25 mg total) by nebulization 2 (two) times daily. (Patient taking differently: Take 0.25 mg by nebulization 2 (two) times daily as needed (for SOB). ), Disp: 360 mL, Rfl: 1 .  budesonide-formoterol (SYMBICORT) 160-4.5 MCG/ACT inhaler, Inhale 2 puffs into the lungs 2 (two) times daily., Disp: 1 Inhaler, Rfl: 6 .  Cholecalciferol (VITAMIN D) 2000 UNITS CAPS, Take 2,000 Units by mouth daily., Disp: , Rfl:  .  dabigatran (PRADAXA) 150 MG CAPS capsule, Take 1 capsule (150 mg total) by mouth 2 (two) times daily., Disp: 60 capsule, Rfl: 1 .  diphenhydrAMINE (BENADRYL) 25 MG tablet, Take 25 mg by mouth at bedtime., Disp: , Rfl:  .  fexofenadine (ALLEGRA) 180 MG tablet, Take 1 tablet (180 mg total) by mouth daily., Disp: 30 tablet, Rfl: 1 .  fluticasone (FLONASE) 50 MCG/ACT nasal spray, Place into both nostrils 2 (two) times daily as needed for allergies or rhinitis., Disp: , Rfl:  .  furosemide (LASIX) 40 MG tablet, Take 1-2 tablets (40-80 mg total) by mouth  daily. (Patient taking differently: Take 40 mg by mouth 2 (two) times daily. ), Disp: 120 tablet, Rfl: 3 .  gabapentin (NEURONTIN) 300 MG capsule, Take 600 mg by mouth at bedtime. , Disp: , Rfl: 3 .  guaiFENesin (MUCINEX) 600 MG 12 hr tablet, Take 600 mg by mouth 2 (two) times daily., Disp: , Rfl:  .  ipratropium (ATROVENT) 0.02 % nebulizer solution, Take 2.5 mLs (0.5 mg total) by nebulization 4 (four) times daily., Disp: 300 mL, Rfl: 1 .  levalbuterol (XOPENEX) 0.63 MG/3ML nebulizer solution, Take 3 mLs (0.63 mg total) by nebulization every 6 (six) hours as needed for wheezing or shortness of breath. (Patient taking differently: Take 0.63 mg by nebulization 2 (two) times daily as needed for wheezing or shortness of breath. ), Disp: 360 mL, Rfl: 1 .  levothyroxine (SYNTHROID, LEVOTHROID) 175 MCG tablet, Take 175 mcg by mouth daily before breakfast., Disp: , Rfl:  .  Melatonin-Pyridoxine (MELATONEX) 3-10 MG TBCR, Take 1 tablet by mouth at bedtime., Disp: , Rfl:  .  metFORMIN (GLUMETZA) 500 MG (MOD) 24 hr tablet, Take 500 mg by mouth daily with breakfast., Disp: , Rfl:  .  metoprolol tartrate (LOPRESSOR) 25 MG tablet, Take 1 tablet (25 mg total) by mouth 2 (two) times daily., Disp: 60 tablet, Rfl: 3 .  pantoprazole (PROTONIX) 40 MG tablet, Take 40 mg by mouth daily., Disp: , Rfl:  .  promethazine (PHENERGAN) 25 MG tablet, Take 25 mg by mouth every 6 (  six) hours as needed for nausea or vomiting., Disp: , Rfl:  .  rOPINIRole (REQUIP) 1 MG tablet, Take 1 tablet (1 mg total) by mouth daily., Disp: 90 tablet, Rfl: 2 .  spironolactone (ALDACTONE) 25 MG tablet, Take 12.5 mg by mouth daily. , Disp: , Rfl:  .  traMADol (ULTRAM) 50 MG tablet, Take 1 tablet by mouth daily. Reported on 10/17/2015, Disp: , Rfl:  .  valACYclovir (VALTREX) 1000 MG tablet, Take 1 tablet by mouth every 12 (twelve) hours as needed (fever blisters). Reported on 10/17/2015, Disp: , Rfl:   Past Medical History: Past Medical History:   Diagnosis Date  . Aortic stenosis, mild   . Arthritis of hand    "just a little bit in both hands" (03/31/2012)  . Asthma    "little bit" (03/31/2012)  . Atrial fibrillation [facility])    dx '04; DCCV '04, placed on flecainide, failed DCCV 04/2010, flecainide stopped; s/p successful A.fib ablation 01/31/12  . CHF (congestive heart failure) ([place])   . Diabetes mellitus without complication (HCC)    borderline  . DJD (degenerative joint disease)   . Exertional dyspnea   . Fibromyalgia   . 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 GERD (gastroesophageal reflux disease)   . Heart murmur   . Hyperlipidemia   . Hypertension   . Hypothyroidism    S/P radiation  . Left atrial enlargement    LA size 78mm by echo 11/21/11  . Mitral regurgitation    trivial  . Obesity   . Obstructive sleep apnea    mild by sleep study 2013; pt stated he does not have a machine because "it wasn't bad enough for him to have one"  . Pacemaker 03/31/2012  . Restless leg syndrome   . Second degree AV block     Tobacco Use: History  Smoking Status  . Former Smoker  . Packs/day: 0.50  . Years: 15.00  . Types: Cigarettes  . Quit date: 07/18/1991  Smokeless Tobacco  . Never Used    Labs: Recent Review Flowsheet Data    Labs for ITP Cardiac and Pulmonary Rehab Latest Ref Rng & Units 08/22/2015 08/22/2015 08/23/2015 08/23/2015 11/23/2015   Cholestrol 125 - 200 mg/dL - - - - -   LDLCALC <130 mg/dL - - - - -   HDL >=40 mg/dL - - - - -   Trlycerides <150 mg/dL - - - - -   Hemoglobin A1c 4.8 - 5.6 % - - - - 5.9(H)   PHART 7.350 - 7.450 - 7.321(L) 7.315(L) - -   PCO2ART 35.0 - 45.0 mmHg - 45.3(H) 41.3 - -   HCO3 20.0 - 24.0 mEq/L - 23.4 21.0 - -   TCO2 0 - 100 mmol/L 25 25 22 26  -   ACIDBASEDEF 0.0 - 2.0 mmol/L - 3.0(H) 5.0(H) - -   O2SAT % - 98.0 94.0 - -      Capillary Blood Glucose: Lab Results  Component Value Date   GLUCAP 136 (H) 11/24/2015   GLUCAP 118 (H) 11/24/2015   GLUCAP 114 (H)  11/24/2015   GLUCAP 120 (H) 11/23/2015   GLUCAP 171 (H) 11/23/2015     Exercise Target Goals:    Exercise Program Goal: Individual exercise prescription set with THRR, safety & activity barriers. Participant demonstrates ability to understand and report RPE using BORG scale, to self-measure pulse accurately, and to acknowledge the importance of the exercise prescription.  Exercise Prescription Goal: Starting with  aerobic activity 30 plus minutes a day, 3 days per week for initial exercise prescription. Provide home exercise prescription and guidelines that participant acknowledges understanding prior to discharge.  Activity Barriers & Risk Stratification:     Activity Barriers & Cardiac Risk Stratification - 10/19/15 1544      Activity Barriers & Cardiac Risk Stratification   Activity Barriers Fibromyalgia;Arthritis      6 Minute Walk:     6 Minute Walk    Row Name 10/17/15 1415         6 Minute Walk   Phase Initial     Distance 1401 feet     Walk Time 6 minutes     # of Rest Breaks 0     MPH 2.65     METS 3.45     RPE 13     VO2 Peak 12.07     Symptoms Yes (comment)     Comments Moderate SOB at end of 6 MWT.     Resting HR 74 bpm     Resting BP 140/80     Max Ex. HR 110 bpm     Max Ex. BP 142/70     2 Minute Post BP 114/60        Initial Exercise Prescription:     Initial Exercise Prescription - 10/25/15 1100      Date of Initial Exercise RX and Referring Provider   Date 10/17/15   Referring Provider Nahser, Philip MD     Treadmill   MPH 2   Grade 1   Minutes 10   METs 2.81     Bike   Level 1.2   Minutes 10   METs 3.45     NuStep   Level 2   Minutes 10   METs 2.5     Track   Laps 7   Minutes 10     Prescription Details   Frequency (times per week) 3     Intensity   THRR 40-80% of Max Heartrate 62-123   Ratings of Perceived Exertion 11-13   Perceived Dyspnea 0-4     Progression   Progression Continue to progress workloads to  maintain intensity without signs/symptoms of physical distress.     Resistance Training   Training Prescription Yes   Weight 2 lbs.   Reps 10-12      Perform Capillary Blood Glucose checks as needed.  Exercise Prescription Changes:   Exercise Comments:     Exercise Comments    Row Name 12/13/15 [phone]           Exercise Comments Patient has been out since 10/25/15 due to health issues.           Discharge Exercise Prescription (Final Exercise Prescription Changes):   Nutrition:  Target Goals: Understanding of nutrition guidelines, daily intake of sodium 1500mg , cholesterol 200mg , calories 30% from fat and 7% or less from saturated fats, daily to have 5 or more servings of fruits and vegetables.  Biometrics:     Pre Biometrics - 10/17/15 1415      Pre Biometrics   Height 5' 10.25" (1.784 m)   Weight 202 lb 9.6 oz (91.9 kg)   Waist Circumference 39 inches   Hip Circumference 42 inches   Waist to Hip Ratio 0.93 %   BMI (Calculated) 28.9   Triceps Skinfold 15 mm   % Body Fat 27.2 %   Grip Strength 42.5 kg   Flexibility 0 in   Single Leg Stand 18.5  seconds       Nutrition Therapy Plan and Nutrition Goals:     Nutrition Therapy & Goals - 10/25/15 1005      Nutrition Therapy   Diet Carb Modified, Therapeutic Lifestyle Changes     Personal Nutrition Goals   Personal Goal #1 1-2 lb wt loss per week to a goal wt loss of 6-24 lb at graduation from Cardiac Rehab   Personal Goal #2 Knowledge re: the basics of the DM diet     Intervention Plan   Intervention Prescribe, educate and counsel regarding individualized specific dietary modifications aiming towards targeted core components such as weight, hypertension, lipid management, diabetes, heart failure and other comorbidities.   Expected Outcomes Short Term Goal: Understand basic principles of dietary content, such as calories, fat, sodium, cholesterol and nutrients.;Long Term Goal: Adherence to prescribed  nutrition plan.      Nutrition Discharge: Nutrition Scores:     Nutrition Assessments - 10/25/15 1007      MEDFICTS Scores   Pre Score 59      Nutrition Goals Re-Evaluation:   Psychosocial: Target Goals: Acknowledge presence or absence of depression, maximize coping skills, provide positive support system. Participant is able to verbalize types and ability to use techniques and skills needed for reducing stress and depression.  Initial Review & Psychosocial Screening:     Initial Psych Review & Screening - 10/17/15 1642      Initial Review   Current issues with Current Stress Concerns     Family Dynamics   Good Support System? Yes     Barriers   Psychosocial barriers to participate in program The patient should benefit from training in stress management and relaxation.     Screening Interventions   Interventions Encouraged to exercise      Quality of Life Scores:     Quality of Life - 10/17/15 1415      Quality of Life Scores   Health/Function Pre 27.32 %   Socioeconomic Pre 27.86 %   Psych/Spiritual Pre 30 %   Family Pre 30 %   GLOBAL Pre 28.41 %      PHQ-9: Recent Review Flowsheet Data    Depression screen [facility] 2/9 10/25/2015   Decreased Interest 0   Down, Depressed, Hopeless 0   PHQ - 2 Score 0      Psychosocial Evaluation and Intervention:   Psychosocial Re-Evaluation:   Vocational Rehabilitation: Provide vocational rehab assistance to qualifying candidates.   Vocational Rehab Evaluation & Intervention:     Vocational Rehab - 10/17/15 1639      Initial Vocational Rehab Evaluation & Intervention   Assessment shows need for Vocational Rehabilitation No      Education: Education Goals: Education classes will be provided on a weekly basis, covering required topics. Participant will state understanding/return demonstration of topics presented.  Learning Barriers/Preferences:     Learning Barriers/Preferences - 10/17/15 1639       Learning Barriers/Preferences   Learning Barriers Sight   Learning Preferences Written Material;Skilled Demonstration      Education Topics: Count Your Pulse:  -Group instruction provided by verbal instruction, demonstration, patient participation and written materials to support subject.  Instructors address importance of being able to find your pulse and how to count your pulse when at home without a heart monitor.  Patients get hands on experience counting their pulse with staff help and individually.   Heart Attack, Angina, and Risk Factor Modification:  -Group instruction provided by verbal instruction, video, and written  materials to support subject.  Instructors address signs and symptoms of angina and heart attacks.    Also discuss risk factors for heart disease and how to make changes to improve heart health risk factors.   Functional Fitness:  -Group instruction provided by verbal instruction, demonstration, patient participation, and written materials to support subject.  Instructors address safety measures for doing things around the house.  Discuss how to get up and down off the floor, how to pick things up properly, how to safely get out of a chair without assistance, and balance training.   Meditation and Mindfulness:  -Group instruction provided by verbal instruction, patient participation, and written materials to support subject.  Instructor addresses importance of mindfulness and meditation practice to help reduce stress and improve awareness.  Instructor also leads participants through a meditation exercise.    Stretching for Flexibility and Mobility:  -Group instruction provided by verbal instruction, patient participation, and written materials to support subject.  Instructors lead participants through series of stretches that are designed to increase flexibility thus improving mobility.  These stretches are additional exercise for major muscle groups that are typically  performed during regular warm up and cool down.   Hands Only CPR Anytime:  -Group instruction provided by verbal instruction, video, patient participation and written materials to support subject.  Instructors co-teach with AHA video for hands only CPR.  Participants get hands on experience with mannequins.   Nutrition I class: Heart Healthy Eating:  -Group instruction provided by PowerPoint slides, verbal discussion, and written materials to support subject matter. The instructor gives an explanation and review of the Therapeutic Lifestyle Changes diet recommendations, which includes a discussion on lipid goals, dietary fat, sodium, fiber, plant stanol/sterol esters, sugar, and the components of a well-balanced, healthy diet.   Nutrition II class: Lifestyle Skills:  -Group instruction provided by PowerPoint slides, verbal discussion, and written materials to support subject matter. The instructor gives an explanation and review of label reading, grocery shopping for heart health, heart healthy recipe modifications, and ways to make healthier choices when eating out.   Diabetes Question & Answer:  -Group instruction provided by PowerPoint slides, verbal discussion, and written materials to support subject matter. The instructor gives an explanation and review of diabetes co-morbidities, pre- and post-prandial blood glucose goals, pre-exercise blood glucose goals, signs, symptoms, and treatment of hypoglycemia and hyperglycemia, and foot care basics.   Diabetes Blitz:  -Group instruction provided by PowerPoint slides, verbal discussion, and written materials to support subject matter. The instructor gives an explanation and review of the physiology behind type 1 and type 2 diabetes, diabetes medications and rational behind using different medications, pre- and post-prandial blood glucose recommendations and Hemoglobin A1c goals, diabetes diet, and exercise including blood glucose guidelines for  exercising safely.    Portion Distortion:  -Group instruction provided by PowerPoint slides, verbal discussion, written materials, and food models to support subject matter. The instructor gives an explanation of serving size versus portion size, changes in portions sizes over the last 20 years, and what consists of a serving from each food group. Flowsheet Row CARDIAC REHAB PHASE II EXERCISE from 10/25/2015 in [place]  Date  10/25/15  Educator  RD  Instruction Review Code  2- meets goals/outcomes      Stress Management:  -Group instruction provided by verbal instruction, video, and written materials to support subject matter.  Instructors review role of stress in heart disease and how to cope with stress positively.  Exercising on Your Own:  -Group instruction provided by verbal instruction, power point, and written materials to support subject.  Instructors discuss benefits of exercise, components of exercise, frequency and intensity of exercise, and end points for exercise.  Also discuss use of nitroglycerin and activating EMS.  Review options of places to exercise outside of rehab.  Review guidelines for sex with heart disease.   Cardiac Drugs I:  -Group instruction provided by verbal instruction and written materials to support subject.  Instructor reviews cardiac drug classes: antiplatelets, anticoagulants, beta blockers, and statins.  Instructor discusses reasons, side effects, and lifestyle considerations for each drug class.   Cardiac Drugs II:  -Group instruction provided by verbal instruction and written materials to support subject.  Instructor reviews cardiac drug classes: angiotensin converting enzyme inhibitors (ACE-I), angiotensin II receptor blockers (ARBs), nitrates, and calcium channel blockers.  Instructor discusses reasons, side effects, and lifestyle considerations for each drug class.   Anatomy and Physiology of the Circulatory  System:  -Group instruction provided by verbal instruction, video, and written materials to support subject.  Reviews functional anatomy of heart, how it relates to various diagnoses, and what role the heart plays in the overall system.   Knowledge Questionnaire Score:     Knowledge Questionnaire Score - 10/19/15 1546      Knowledge Questionnaire Score   Pre Score        Core Components/Risk Factors/Patient Goals at Admission:     Personal Goals and Risk Factors at Admission - 10/17/15 1640      Core Components/Risk Factors/Patient Goals on Admission    Weight Management Weight Loss;Yes   Intervention Weight Management: Develop a combined nutrition and exercise program designed to reach desired caloric intake, while maintaining appropriate intake of nutrient and fiber, sodium and fats, and appropriate energy expenditure required for the weight goal.;Weight Management: Provide education and appropriate resources to help participant work on and attain dietary goals.;Weight Management/Obesity: Establish reasonable short term and long term weight goals.   Admit Weight 202 lb 9.6 oz (91.9 kg)   Expected Outcomes Short Term: Continue to assess and modify interventions until short term weight is achieved;Long Term: Adherence to nutrition and physical activity/exercise program aimed toward attainment of established weight goal;Understanding of distribution of calorie intake throughout the day with the consumption of 4-5 meals/snacks   Heart Failure Yes   Personal Goal Other Yes   Personal Goal Be able to do ADL's.   Intervention Provide individualized exercise prescription to improve cardiorespiratory fitness and mobility, so patient is able to do ADL's.   Expected Outcomes Maintain regular exercise routine including aerobic exercise, stretching and light hand weights.      Core Components/Risk Factors/Patient Goals Review:    Core Components/Risk Factors/Patient Goals at Discharge (Final  Review):    ITP Comments:     ITP Comments    Row Name 10/17/15 1638           ITP Comments Dr. Fransico Him, Medical Director           Comments: Pt has not been medically able to participate in cardiac rehab.  Pt remains on hold during his recovery from cardioversion and respiratory illness. Pt last exercise session was 10/25/15. Cherre Huger, BSN

## 2015-12-15 ENCOUNTER — Encounter: Payer: Self-pay | Admitting: Cardiovascular Disease

## 2015-12-15 ENCOUNTER — Encounter (HOSPITAL_COMMUNITY): Payer: Medicare Other

## 2015-12-15 ENCOUNTER — Ambulatory Visit (INDEPENDENT_AMBULATORY_CARE_PROVIDER_SITE_OTHER): Payer: Medicare Other | Admitting: Cardiovascular Disease

## 2015-12-15 VITALS — BP 146/70 | HR 84 | Ht 71.0 in | Wt 209.1 lb

## 2015-12-15 DIAGNOSIS — I5033 Acute on chronic diastolic (congestive) heart failure: Secondary | ICD-10-CM

## 2015-12-15 DIAGNOSIS — I350 Nonrheumatic aortic (valve) stenosis: Secondary | ICD-10-CM | POA: Diagnosis not present

## 2015-12-15 DIAGNOSIS — I481 Persistent atrial fibrillation: Secondary | ICD-10-CM | POA: Diagnosis not present

## 2015-12-15 DIAGNOSIS — I4819 Other persistent atrial fibrillation: Secondary | ICD-10-CM

## 2015-12-15 MED ORDER — METOPROLOL TARTRATE 50 MG PO TABS
50.0000 mg | ORAL_TABLET | Freq: Two times a day (BID) | ORAL | 11 refills | Status: DC
Start: 2015-12-15 — End: 2015-12-15

## 2015-12-15 MED ORDER — METOPROLOL TARTRATE 50 MG PO TABS
50.0000 mg | ORAL_TABLET | Freq: Two times a day (BID) | ORAL | 11 refills | Status: DC
Start: 2015-12-15 — End: 2016-07-11

## 2015-12-15 MED ORDER — POTASSIUM CHLORIDE ER 10 MEQ PO TBCR: 10.0000 meq | EXTENDED_RELEASE_TABLET | Freq: Every day | ORAL | 11 refills | Status: DC

## 2015-12-15 NOTE — Patient Instructions (Signed)
Medication Instructions:  INCREASE Metoprolol (Lopressor) to 50 mg twice daily START Kdur (potassium supplement) 10 meq once daily   Labwork: Your physician recommends that you return for lab work in: 3 weeks for basic metabolic panel   Testing/Procedures: None Ordered   Follow-Up: Your physician recommends that you schedule a follow-up appointment in: 3 months with Dr. Acie Fredrickson.    If you need a refill on your cardiac medications before your next appointment, please call your pharmacy.   Thank you for choosing [facility]! Christen Bame, RN [phone]

## 2015-12-15 NOTE — Progress Notes (Signed)
Cardiology Office Note   Date:  12/15/2015   ID:  [name], DOB 01/01/1951, MRN [id number]  PCP:  Velna Hatchet, MD  Cardiologist:   Mertie Moores, MD   No chief complaint on file.  Problem List: 1. Aortic stenosis - s/p AVR + MAZE procedure   2.   Essential hypertension 3. Chest pain  - Normal cors by cath in 2014 4. Pulmonary Hypertension 5. Atrial fib - s/p Afib ablation ,  S/p pacer  6. Hyperlipidemia-  7. Fibromyalgia - does not sleep well.     History of Present Illness: [name] is a 65 y.o. male who presents for HTN and shortness of breath. I saw him many years ago .  He has been seeing Dr. Einar Gip in the interim.   His main complaint today is that he has severe DOE with any exercise.  Has severe DOE walking 10-20 feet.  Then he has severe nausea with vomitting.    No CP .  Watches his salt.    Has a poor appetite recently  has not lost much weight.  Has fibromyalgia - does not sleep well. Does not have sleep apnea.   Has had several sleep studies.    Has leg and foot swelling - especially at night.  Better in the am .   August 24, 2014:  [name] is doing better.   Still has some dyspnea with exertion.   We started Aldactone during the last visit.   His blood pressure is much better. He's able to walk further and he does not get at her breath quite as easily. He still becomes short of breath if he walks up a hill or walks too fast.  Aug. 29, 2016:  [name] is doing well. Is sleeping better .  Still has episodes of weakness  No syncopal episodes,  Has some dizziness.  No CP or dyspnea.   Did not go for the sleep study because he is sleeping   August 03, 2015:  [name] is seen back today for follow up of his pulmonary HTN and HTN He has been feeling a bit weak and dizzy.  BP is borderline low today   Recent echo shows normal LV function with severe AS He has minimal MR   Sep 18, 2015:  He had AVR on April 25. Not doing As well as he would like.  He is not sleeping well at all. He's had some acute worsening of his renal function. We've cut back his Lasix and we have held his valsartan.  Blood pressure still is a little bit low.  Breathing is better.   Using breathing treatments.   Was readmitted to the hospital with pleural effusion  - had a thoracentesis on May 8.     We cut his lasix to 40 mg a day  - still is having a decent urine output.   November 02, 2015:  Doing better. BP is stable. O2 sats = 98%  Still gets very short of breath with any exertion . Fatigues easily   Has had difficulty getting going in cardiac rehab. Has had severe fatigue   His echo performed after his AVR shows normal LV function, normal prosthetic AV function Improved pulmonary pressures ( from 50 mmhg to 38 mmHg)   Aug. 18, 2017:  [name] is seen today for follow up of his dyspnea.  He's had a difficult time getting over his aortic valve replacement. He was cardioverted since our last visit Felt  immediately better after the cardioversion   He was cardioverted on July 27. He developed hypoxemia after the procedure and was admitted overnight. He's feeling quite a bit better at this time.   Past Medical History:  Diagnosis Date  . Aortic stenosis, mild   . Arthritis of hand    "just a little bit in both hands" (03/31/2012)  . Asthma    "little bit" (03/31/2012)  . Atrial fibrillation [facility])    dx '04; DCCV '04, placed on flecainide, failed DCCV 04/2010, flecainide stopped; s/p successful A.fib ablation 01/31/12  . CHF (congestive heart failure) ([place])   . Diabetes mellitus without complication (HCC)    borderline  . DJD (degenerative joint disease)   . Exertional dyspnea   . Fibromyalgia   . First degree AV block    post ablation, ~419ms  . GERD (gastroesophageal reflux disease)   . Heart murmur   . Hyperlipidemia   . Hypertension   . Hypothyroidism    S/P radiation  . Left atrial enlargement    LA size 30mm by echo 11/21/11  . Mitral  regurgitation    trivial  . Obesity   . Obstructive sleep apnea    mild by sleep study 2013; pt stated he does not have a machine because "it wasn't bad enough for him to have one"  . Pacemaker 03/31/2012  . Restless leg syndrome   . Second degree AV block     Past Surgical History:  Procedure Laterality Date  . AORTIC VALVE REPLACEMENT N/A 08/22/2015   Procedure: AORTIC VALVE REPLACEMENT (AVR);  Surgeon: Ivin Poot, MD;  Location: [place];  Service: Open Heart Surgery;  Laterality: N/A;  . ATRIAL FIBRILLATION ABLATION  01/30/2012   PVI by Dr. Rayann Heman  . ATRIAL FIBRILLATION ABLATION N/A 01/31/2012   Procedure: ATRIAL FIBRILLATION ABLATION;  Surgeon: Thompson Grayer, MD;  Location: [facility] CATH LAB;  Service: Cardiovascular;  Laterality: N/A;  . BACK SURGERY     X 3  . CARDIAC CATHETERIZATION N/A 08/09/2015   Procedure: Right/Left Heart Cath and Coronary Angiography;  Surgeon: Peter M Martinique, MD;  Location: [place] CV LAB;  Service: Cardiovascular;  Laterality: N/A;  . CARDIOVASCULAR STRESS TEST  03/12/2002   EF 48%, NO EVIDENCE OF ISCHEMIA  . CARDIOVERSION  01/2012; 03/31/2012  . CARDIOVERSION N/A 02/25/2012   Procedure: CARDIOVERSION;  Surgeon: Thompson Grayer, MD;  Location: [facility] CATH LAB;  Service: Cardiovascular;  Laterality: N/A;  . CARDIOVERSION N/A 03/31/2012   Procedure: CARDIOVERSION;  Surgeon: Thompson Grayer, MD;  Location: [facility] CATH LAB;  Service: Cardiovascular;  Laterality: N/A;  . CARDIOVERSION N/A 11/23/2015   Procedure: CARDIOVERSION;  Surgeon: Larey Dresser, MD;  Location: [place];  Service: Cardiovascular;  Laterality: N/A;  . CLIPPING OF ATRIAL APPENDAGE N/A 08/22/2015   Procedure: CLIPPING OF ATRIAL APPENDAGE;  Surgeon: Ivin Poot, MD;  Location: [place];  Service: Open Heart Surgery;  Laterality: N/A;  . DOPPLER ECHOCARDIOGRAPHY  03/11/2002   EF 70-75%  . FINGER SURGERY Left    Middle finger  . FINGER TENDON REPAIR  1980's   "right little finger" (03/31/2012)  .  INSERT / REPLACE / REMOVE PACEMAKER     [employer]  . LEFT AND RIGHT HEART CATHETERIZATION WITH CORONARY ANGIOGRAM N/A 10/27/2012   Procedure: LEFT AND RIGHT HEART CATHETERIZATION WITH CORONARY ANGIOGRAM;  Surgeon: Laverda Page, MD;  Location: [facility] CATH LAB;  Service: Cardiovascular;  Laterality: N/A;  . LUMBAR DISC SURGERY  1980's X2;  2000's  .  MAZE N/A 08/22/2015   Procedure: MAZE;  Surgeon: Ivin Poot, MD;  Location: [place];  Service: Open Heart Surgery;  Laterality: N/A;  . PACEMAKER INSERTION  03/31/2012   STJ Accent DR pacemaker implanted by Dr Rayann Heman  . PERMANENT PACEMAKER INSERTION N/A 03/31/2012   Procedure: PERMANENT PACEMAKER INSERTION;  Surgeon: Thompson Grayer, MD;  Location: [facility] CATH LAB;  Service: Cardiovascular;  Laterality: N/A;  . TEE WITHOUT CARDIOVERSION  01/30/2012   Procedure: TRANSESOPHAGEAL ECHOCARDIOGRAM (TEE);  Surgeon: Larey Dresser, MD;  Location: [place];  Service: Cardiovascular;  Laterality: N/A;  ablation next day  . TEE WITHOUT CARDIOVERSION N/A 08/22/2015   Procedure: TRANSESOPHAGEAL ECHOCARDIOGRAM (TEE);  Surgeon: Ivin Poot, MD;  Location: [place];  Service: Open Heart Surgery;  Laterality: N/A;  . US ECHOCARDIOGRAPHY  08/07/2009   EF 55-60%     Current Outpatient Prescriptions  Medication Sig Dispense Refill  . amLODipine (NORVASC) 10 MG tablet Take 0.5 tablets (5 mg total) by mouth daily. 30 tablet 11  . atorvastatin (LIPITOR) 40 MG tablet Take 1 tablet (40 mg total) by mouth daily. (Patient taking differently: Take 40 mg by mouth daily at 6 PM. ) 90 tablet 1  . budesonide (PULMICORT) 0.25 MG/2ML nebulizer solution Take 2 mLs (0.25 mg total) by nebulization 2 (two) times daily. (Patient taking differently: Take 0.25 mg by nebulization 2 (two) times daily as needed (for SOB). ) 360 mL 1  . budesonide-formoterol (SYMBICORT) 160-4.5 MCG/ACT inhaler Inhale 2 puffs into the lungs 2 (two) times daily. 1 Inhaler 6  . Cholecalciferol (VITAMIN D) 2000 UNITS  CAPS Take 2,000 Units by mouth daily.    . dabigatran (PRADAXA) 150 MG CAPS capsule Take 1 capsule (150 mg total) by mouth 2 (two) times daily. 60 capsule 1  . diphenhydrAMINE (BENADRYL) 25 MG tablet Take 25 mg by mouth at bedtime.    . fexofenadine (ALLEGRA) 180 MG tablet Take 1 tablet (180 mg total) by mouth daily. 30 tablet 1  . fluticasone (FLONASE) 50 MCG/ACT nasal spray Place into both nostrils 2 (two) times daily as needed for allergies or rhinitis.    . furosemide (LASIX) 40 MG tablet Take 1-2 tablets (40-80 mg total) by mouth daily. (Patient taking differently: Take 40 mg by mouth 2 (two) times daily. ) 120 tablet 3  . gabapentin (NEURONTIN) 300 MG capsule Take 600 mg by mouth at bedtime.   3  . guaiFENesin (MUCINEX) 600 MG 12 hr tablet Take 600 mg by mouth 2 (two) times daily.    [name] ipratropium (ATROVENT) 0.02 % nebulizer solution Take 2.5 mLs (0.5 mg total) by nebulization 4 (four) times daily. 300 mL 1  . levalbuterol (XOPENEX) 0.63 MG/3ML nebulizer solution Take 3 mLs (0.63 mg total) by nebulization every 6 (six) hours as needed for wheezing or shortness of breath. (Patient taking differently: Take 0.63 mg by nebulization 2 (two) times daily as needed for wheezing or shortness of breath. ) 360 mL 1  . levothyroxine (SYNTHROID, LEVOTHROID) 175 MCG tablet Take 175 mcg by mouth daily before breakfast.    . Melatonin-Pyridoxine (MELATONEX) 3-10 MG TBCR Take 1 tablet by mouth at bedtime.    . metFORMIN (GLUMETZA) 500 MG (MOD) 24 hr tablet Take 500 mg by mouth daily with breakfast.    . metoprolol tartrate (LOPRESSOR) 25 MG tablet Take 1 tablet (25 mg total) by mouth 2 (two) times daily. 60 tablet 3  . pantoprazole (PROTONIX) 40 MG tablet Take 40 mg by mouth daily.    [name]  promethazine (PHENERGAN) 25 MG tablet Take 25 mg by mouth every 6 (six) hours as needed for nausea or vomiting.    [name] rOPINIRole (REQUIP) 1 MG tablet Take 1 tablet (1 mg total) by mouth daily. 90 tablet 2  . spironolactone  (ALDACTONE) 25 MG tablet Take 12.5 mg by mouth daily.     . traMADol (ULTRAM) 50 MG tablet Take 1 tablet by mouth daily. Reported on 10/17/2015    . valACYclovir (VALTREX) 1000 MG tablet Take 1 tablet by mouth every 12 (twelve) hours as needed (fever blisters). Reported on 10/17/2015     No current facility-administered medications for this visit.     Allergies:   Meloxicam and Vancomycin    Social History:  The patient  reports that he quit smoking about 24 years ago. His smoking use included Cigarettes. He has a 7.50 pack-year smoking history. He has never used smokeless tobacco. He reports that he drinks alcohol. He reports that he uses drugs, including Marijuana.   Family History:  The patient's family history includes Heart failure in his mother; Stroke in his father.    ROS:  Please see the history of present illness.    Review of Systems: Constitutional:  denies fever, chills, diaphoresis, appetite change and fatigue.  HEENT: denies photophobia, eye pain, redness, hearing loss, ear pain, congestion, sore throat, rhinorrhea, sneezing, neck pain, neck stiffness and tinnitus.  Respiratory: admits to SOB, DOE,    Cardiovascular: admits to   leg swelling.  Gastrointestinal: admits to nausea, vomiting, with walking   Genitourinary: denies dysuria, urgency, frequency, hematuria, flank pain and difficulty urinating.  Musculoskeletal: denies  myalgias, back pain, joint swelling, arthralgias and gait problem.   Skin: denies pallor, rash and wound.  Neurological: denies dizziness, seizures, syncope, weakness, light-headedness, numbness and headaches.   Hematological: denies adenopathy, easy bruising, personal or family bleeding history.  Psychiatric/ Behavioral: denies suicidal ideation, mood changes, confusion, nervousness, sleep disturbance and agitation.       All other systems are reviewed and negative.    PHYSICAL EXAM: VS:  BP (!) 146/70   Pulse 84   Ht 5\' 11"  (1.803 m)   Wt  209 lb 1.9 oz (94.9 kg)   BMI 29.17 kg/m  , BMI Body mass index is 29.17 kg/m. GEN: Well nourished, well developed, in no acute distress  HEENT: normal  Neck: no JVD, carotid bruits, or masses Cardiac: RRR; he has a 2/6 systolic murmur at the upper left sternal border. No , rubs, or gallops,no significant edema  Respiratory:  clear to auscultation bilaterally, normal work of breathing GI: soft, nontender, nondistended, + BS MS: no deformity or atrophy  Skin: warm and dry, no rash Neuro:  Strength and sensation are intact Psych: normal   EKG:  EKG is ordered today. The ekg ordered today demonstrates :  Underlying atrial fib.   V pacing at 70   Recent Labs: 08/23/2015: Magnesium 2.4 09/04/2015: TSH 0.441 11/07/2015: Pro B Natriuretic peptide (BNP) 178.0 11/21/2015: Hemoglobin 9.3; Platelets 266 11/23/2015: B Natriuretic Peptide 159.2 11/24/2015: ALT 17 12/04/2015: BUN 19; Creat 1.44; Potassium 3.4; Sodium 137    Lipid Panel    Component Value Date/Time   CHOL 114 (L) 08/03/2015 1036   TRIG 111 08/03/2015 1036   HDL 30 (L) 08/03/2015 1036   CHOLHDL 3.8 08/03/2015 1036   VLDL 22 08/03/2015 1036   LDLCALC 62 08/03/2015 1036      Wt Readings from Last 3 Encounters:  12/15/15 209 lb 1.9 oz (  94.9 kg)  12/06/15 209 lb (94.8 kg)  11/24/15 197 lb 1.6 oz (89.4 kg)      Other studies Reviewed: Additional studies/ records that were reviewed today include:  Review of the above records demonstrates:    ASSESSMENT AND PLAN:  1. Essential hypertension-    BP is minimally elevated. Will increase metoprolol to 50 BID   2. Aortic stenosis: s/p AVR , bioprosthetic valve. (April 25 ,2017)   Repeat echo looks good  He is finally getting over his AVR surgery    3. Shortness of breath:   His breathing seems to improve. His O2 saturations remained stable. .  4. Pulmonary Hypertension- he has moderate Pulmonary HTN by cath - PA pressures in the 50.  His pulmonary artery pressures have  improved to 38 mmHg following his surgery.  5. Atrial fib - Continue Pradaxa.    Had a cardioversion in July, 2017 ,  Feels much better.   6. Hyperlipidemia-  followed by his primary medical doctor  Current medicines are reviewed at length with the patient today.  The patient does not have concerns regarding medicines.  The following changes have been made:  no change  Labs/ tests ordered today include:  No orders of the defined types were placed in this encounter.   Will see him in 3 months   , Mertie Moores, MD  12/15/2015 1:42 PM    [facility] [place], [place], [place]  [id number] Phone: [phone]; Fax: [phone]

## 2015-12-18 ENCOUNTER — Encounter (HOSPITAL_COMMUNITY): Payer: Medicare Other

## 2015-12-20 ENCOUNTER — Encounter (HOSPITAL_COMMUNITY): Admission: RE | Admit: 2015-12-20 | Payer: Medicare Other | Source: Ambulatory Visit

## 2015-12-22 ENCOUNTER — Encounter (HOSPITAL_COMMUNITY): Admission: RE | Admit: 2015-12-22 | Payer: Medicare Other | Source: Ambulatory Visit

## 2015-12-22 ENCOUNTER — Telehealth (HOSPITAL_COMMUNITY): Payer: Self-pay | Admitting: *Deleted

## 2015-12-22 NOTE — Telephone Encounter (Signed)
Pt seen in office this week by cardiologist.  [name] of pt plans to return to exercise.  Message left inquiring about pt ability to return back to exercise or if he preferred to exercise at home. Cherre Huger, BSN

## 2015-12-25 ENCOUNTER — Encounter (HOSPITAL_COMMUNITY): Admission: RE | Admit: 2015-12-25 | Payer: Medicare Other | Source: Ambulatory Visit

## 2015-12-25 ENCOUNTER — Other Ambulatory Visit: Payer: Self-pay

## 2015-12-25 ENCOUNTER — Other Ambulatory Visit: Payer: Self-pay | Admitting: *Deleted

## 2015-12-25 MED ORDER — SPIRONOLACTONE 25 MG PO TABS: 12.5000 mg | ORAL_TABLET | Freq: Every day | ORAL | 3 refills | Status: DC

## 2015-12-25 NOTE — Telephone Encounter (Signed)
spironolactone (ALDACTONE) 25 MG tablet  Medication  Date: 12/25/2015 Department: [address] Office Ordering/Authorizing: Thayer Headings, MD  Order Providers   Prescribing Provider Encounter Provider  Thayer Headings, MD Roberts Gaudy, CMA  Medication Detail    Disp Refills Start End   spironolactone (ALDACTONE) 25 MG tablet 45 tablet 3 12/25/2015    Sig - Route: Take 0.5 tablets (12.5 mg total) by mouth daily. - Oral   E-Prescribing Status: Receipt confirmed by pharmacy (12/25/2015 2:59 PM EDT)   [place] [id number] - [place] - [address] AT [place]

## 2015-12-27 ENCOUNTER — Telehealth: Payer: Self-pay | Admitting: Emergency Medicine

## 2015-12-27 ENCOUNTER — Encounter (HOSPITAL_COMMUNITY): Admission: RE | Admit: 2015-12-27 | Payer: Medicare Other | Source: Ambulatory Visit

## 2015-12-27 NOTE — Telephone Encounter (Signed)
Albuterol OK 2 puff q4-6 hours prn dyspnea

## 2015-12-27 NOTE — Telephone Encounter (Signed)
Called and spoke with pts wife  and she stated that the pt has been out of his rescue inhaler.  I see this listed on his history of med list, but it has not been filled since 2016.  RB is out of the office today, BQ please advise.  Thanks  Allergies  Allergen Reactions  . Meloxicam Rash  . 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 syndrome 09/02/15, resolved with diphenhydramine and slowing of rate

## 2015-12-28 MED ORDER — ALBUTEROL SULFATE HFA 108 (90 BASE) MCG/ACT IN AERS: 2.0000 | INHALATION_SPRAY | Freq: Four times a day (QID) | RESPIRATORY_TRACT | 5 refills | Status: DC | PRN

## 2015-12-28 NOTE — Telephone Encounter (Signed)
Spoke with pt's wife. She is aware that we will send in prescription. Nothing further was needed.

## 2015-12-28 NOTE — Telephone Encounter (Signed)
lmtcb x1 for pt's wife, [name].

## 2015-12-29 ENCOUNTER — Encounter (HOSPITAL_COMMUNITY): Admission: RE | Admit: 2015-12-29 | Payer: Medicare Other | Source: Ambulatory Visit

## 2016-01-02 ENCOUNTER — Ambulatory Visit: Payer: Medicare Other | Admitting: Emergency Medicine

## 2016-01-03 ENCOUNTER — Encounter (HOSPITAL_COMMUNITY): Payer: Medicare Other

## 2016-01-05 ENCOUNTER — Other Ambulatory Visit: Payer: Medicare Other | Admitting: *Deleted

## 2016-01-05 ENCOUNTER — Encounter (HOSPITAL_COMMUNITY): Payer: Medicare Other

## 2016-01-05 DIAGNOSIS — I350 Nonrheumatic aortic (valve) stenosis: Secondary | ICD-10-CM

## 2016-01-05 LAB — BASIC METABOLIC PANEL
BUN: 30 mg/dL — ABNORMAL HIGH (ref 7–25)
CO2: 24 mmol/L (ref 20–31)
Calcium: 9.1 mg/dL (ref 8.6–10.3)
Chloride: 97 mmol/L — ABNORMAL LOW (ref 98–110)
Creat: 1.92 mg/dL — ABNORMAL HIGH (ref 0.70–1.25)
Glucose, Bld: 217 mg/dL — ABNORMAL HIGH (ref 65–99)
Potassium: 4.3 mmol/L (ref 3.5–5.3)
Sodium: 134 mmol/L — ABNORMAL LOW (ref 135–146)

## 2016-01-08 ENCOUNTER — Encounter (HOSPITAL_COMMUNITY): Payer: Medicare Other

## 2016-01-08 ENCOUNTER — Encounter (HOSPITAL_COMMUNITY): Payer: Self-pay | Admitting: *Deleted

## 2016-01-08 ENCOUNTER — Other Ambulatory Visit: Payer: Self-pay | Admitting: Cardiovascular Disease

## 2016-01-08 DIAGNOSIS — I10 Essential (primary) hypertension: Secondary | ICD-10-CM

## 2016-01-08 DIAGNOSIS — Z952 Presence of prosthetic heart valve: Secondary | ICD-10-CM

## 2016-01-08 MED ORDER — FUROSEMIDE 40 MG PO TABS: 40.0000 mg | ORAL_TABLET | Freq: Every day | ORAL | 3 refills | Status: DC

## 2016-01-08 NOTE — Progress Notes (Signed)
Discharge Summary  Patient Details  Name: [name] MRN: [id number] Date of Birth: 07/23/1950 Referring Provider:   Flowsheet Row CARDIAC REHAB PHASE II EXERCISE from 10/25/2015 in [place]  Referring Provider  Nahser, Arnette Norris MD       Number of Visits: 2  Reason for Discharge:   Early Exit      Pt completed 2 sessions 6/20 walk test and 6/28 pt completed 2 stations before having to stop due to fatigue and shortness of breath.  Message left on 8/31 regarding returning to cardiac rehab remain unanswered.  Will plan to d/c pt from rehab.  Pt last exercise session was 6/28.           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 Former Smoker  . Packs/day: 0.50  . Years: 15.00  . Types: Cigarettes  . Quit date: 07/18/1991  Smokeless Tobacco  . Never Used    Diagnosis:  No diagnosis found.  ADL UCSD:   Initial Exercise Prescription:   Discharge Exercise Prescription (Final Exercise Prescription Changes):   Functional Capacity:   Psychological, QOL, Others - Outcomes: PHQ 2/9: Depression screen PHQ 2/9 10/25/2015  Decreased Interest 0  Down, Depressed, Hopeless 0  PHQ - 2 Score 0    Quality of Life:   Personal Goals: Goals established at orientation with interventions provided to work toward goal.    Personal Goals Discharge:   Nutrition & Weight - Outcomes:    Nutrition:   Nutrition Discharge:   Education Questionnaire Score:

## 2016-01-08 NOTE — Telephone Encounter (Signed)
New message    Pts wife returning nurse call.

## 2016-01-08 NOTE — Telephone Encounter (Signed)
Reviewed lab results and plan of care with patient's wife, Adonis Brook.  She states she saw lab work on [employer] and is aware patient is dehydrated.  She states he drinks water throughout the day but also drinks a great deal of ginger ale.  I advised her to advise patient to drink limited ginger ale and to increase intake of water to at least 64 oz daily.  She states he is complaining of fatigue and wonders if he is taking too much lopressor (he takes 50 mg bid).  She states patient's heart rate has been in the 80's bpm and blood pressure has been "good."  I advised her to have patient continue current dose of lopressor as his heart rate does not need to increase and to decrease furosemide to 40 mg daily. He is scheduled for repeat bmet on 10/10 and I advised her to call back sooner with questions or concerns or if patient does not feel better after increasing hydration and decreasing furosemide.  She verbalized understanding and agreement and thanked me for the call.

## 2016-01-09 ENCOUNTER — Other Ambulatory Visit: Payer: Self-pay | Admitting: Cardiothoracic Surgery

## 2016-01-09 DIAGNOSIS — I7121 Aneurysm of the ascending aorta, without rupture: Secondary | ICD-10-CM

## 2016-01-09 DIAGNOSIS — I712 Thoracic aortic aneurysm, without rupture: Secondary | ICD-10-CM

## 2016-01-10 ENCOUNTER — Encounter (HOSPITAL_COMMUNITY): Payer: Medicare Other

## 2016-01-10 ENCOUNTER — Ambulatory Visit
Admission: RE | Admit: 2016-01-10 | Discharge: 2016-01-10 | Disposition: A | Discharge status: Home / Self Care | Payer: Medicare Other | Source: Ambulatory Visit | Attending: Cardiothoracic Surgery | Admitting: Cardiothoracic Surgery

## 2016-01-10 ENCOUNTER — Ambulatory Visit (INDEPENDENT_AMBULATORY_CARE_PROVIDER_SITE_OTHER): Payer: Medicare Other | Admitting: Cardiothoracic Surgery

## 2016-01-10 ENCOUNTER — Encounter: Payer: Self-pay | Admitting: Cardiothoracic Surgery

## 2016-01-10 VITALS — BP 122/67 | HR 80 | Resp 16 | Ht 71.0 in | Wt 210.0 lb

## 2016-01-10 DIAGNOSIS — Z954 Presence of other heart-valve replacement: Secondary | ICD-10-CM | POA: Diagnosis not present

## 2016-01-10 DIAGNOSIS — Z9889 Other specified postprocedural states: Secondary | ICD-10-CM | POA: Diagnosis not present

## 2016-01-10 DIAGNOSIS — Z952 Presence of prosthetic heart valve: Secondary | ICD-10-CM

## 2016-01-10 DIAGNOSIS — R0602 Shortness of breath: Secondary | ICD-10-CM | POA: Diagnosis not present

## 2016-01-10 DIAGNOSIS — I7121 Aneurysm of the ascending aorta, without rupture: Secondary | ICD-10-CM

## 2016-01-10 DIAGNOSIS — I712 Thoracic aortic aneurysm, without rupture: Secondary | ICD-10-CM

## 2016-01-10 DIAGNOSIS — Z8679 Personal history of other diseases of the circulatory system: Secondary | ICD-10-CM | POA: Diagnosis not present

## 2016-01-10 NOTE — Progress Notes (Signed)
PCP is Velna Hatchet, MD Referring Provider is Martinique, Peter M, MD  Chief Complaint  Patient presents with  . Routine Post Op    2 month f/u with CXR s/p AVR/MAZE    HPI: Patient returns for final surgical follow-up almost 5 months after aortic valve replacement for aortic stenosis with combined maze procedure. The patient was recently hospitalized for cardioversion back to sinus rhythm. At that time he was noted to be in mild-moderate heart failure from his diastolic dysfunction. He was diuresis 7 pounds of fluid with improved symptoms. An echocardiogram was performed during his hospitalization last month which I personally reviewed. The aortic prosthesis is working normally. There is no significant gradient or insufficiency. The patient has LVH and moderate diastolic dysfunction. Patient also has COPD and mild-moderate renal insufficiency  in addition to his diastolic dysfunction and has had a difficult time with dyspnea on exertion, maintaining satisfactory creatinine and careful adjustment of diuretic doses. His creatinine recently was 1.9 and his diuretic doses were reduced. Chest x-ray today shows no pleural effusion but mild vascular congestion. He now complains more dyspnea on exertion. His O2 saturation after walking in the office was less than 90%. The patient may benefit from home oxygen. He is followed by Dr. Lamonte Sakai for his COPD.  Past Medical History:  Diagnosis Date  . Aortic stenosis, mild   . Arthritis of hand    "just a little bit in both hands" (03/31/2012)  . Asthma    "little bit" (03/31/2012)  . Atrial fibrillation [facility])    dx '04; DCCV '04, placed on flecainide, failed DCCV 04/2010, flecainide stopped; s/p successful A.fib ablation 01/31/12  . CHF (congestive heart failure) ([place])   . Diabetes mellitus without complication (HCC)    borderline  . DJD (degenerative joint disease)   . Exertional dyspnea   . Fibromyalgia   . First degree AV block    post ablation, ~476ms   . GERD (gastroesophageal reflux disease)   . Heart murmur   . Hyperlipidemia   . Hypertension   . Hypothyroidism    S/P radiation  . Left atrial enlargement    LA size 61mm by echo 11/21/11  . Mitral regurgitation    trivial  . Obesity   . Obstructive sleep apnea    mild by sleep study 2013; pt stated he does not have a machine because "it wasn't bad enough for him to have one"  . Pacemaker 03/31/2012  . Restless leg syndrome   . Second degree AV block     Past Surgical History:  Procedure Laterality Date  . AORTIC VALVE REPLACEMENT N/A 08/22/2015   Procedure: AORTIC VALVE REPLACEMENT (AVR);  Surgeon: Ivin Poot, MD;  Location: [place];  Service: Open Heart Surgery;  Laterality: N/A;  . ATRIAL FIBRILLATION ABLATION  01/30/2012   PVI by Dr. Rayann Heman  . ATRIAL FIBRILLATION ABLATION N/A 01/31/2012   Procedure: ATRIAL FIBRILLATION ABLATION;  Surgeon: Thompson Grayer, MD;  Location: [facility] CATH LAB;  Service: Cardiovascular;  Laterality: N/A;  . BACK SURGERY     X 3  . CARDIAC CATHETERIZATION N/A 08/09/2015   Procedure: Right/Left Heart Cath and Coronary Angiography;  Surgeon: Peter M Martinique, MD;  Location: [place] CV LAB;  Service: Cardiovascular;  Laterality: N/A;  . CARDIOVASCULAR STRESS TEST  03/12/2002   EF 48%, NO EVIDENCE OF ISCHEMIA  . CARDIOVERSION  01/2012; 03/31/2012  . CARDIOVERSION N/A 02/25/2012   Procedure: CARDIOVERSION;  Surgeon: Thompson Grayer, MD;  Location: [facility] CATH LAB;  Service: Cardiovascular;  Laterality: N/A;  . CARDIOVERSION N/A 03/31/2012   Procedure: CARDIOVERSION;  Surgeon: Thompson Grayer, MD;  Location: [facility] CATH LAB;  Service: Cardiovascular;  Laterality: N/A;  . CARDIOVERSION N/A 11/23/2015   Procedure: CARDIOVERSION;  Surgeon: Larey Dresser, MD;  Location: [place];  Service: Cardiovascular;  Laterality: N/A;  . CLIPPING OF ATRIAL APPENDAGE N/A 08/22/2015   Procedure: CLIPPING OF ATRIAL APPENDAGE;  Surgeon: Ivin Poot, MD;  Location: [place];  Service: Open  Heart Surgery;  Laterality: N/A;  . DOPPLER ECHOCARDIOGRAPHY  03/11/2002   EF 70-75%  . FINGER SURGERY Left    Middle finger  . FINGER TENDON REPAIR  1980's   "right little finger" (03/31/2012)  . INSERT / REPLACE / REMOVE PACEMAKER     [employer]  . LEFT AND RIGHT HEART CATHETERIZATION WITH CORONARY ANGIOGRAM N/A 10/27/2012   Procedure: LEFT AND RIGHT HEART CATHETERIZATION WITH CORONARY ANGIOGRAM;  Surgeon: Laverda Page, MD;  Location: [facility] CATH LAB;  Service: Cardiovascular;  Laterality: N/A;  . LUMBAR DISC SURGERY  1980's X2;  2000's  . MAZE N/A 08/22/2015   Procedure: MAZE;  Surgeon: Ivin Poot, MD;  Location: [place];  Service: Open Heart Surgery;  Laterality: N/A;  . PACEMAKER INSERTION  03/31/2012   STJ Accent DR pacemaker implanted by Dr Rayann Heman  . PERMANENT PACEMAKER INSERTION N/A 03/31/2012   Procedure: PERMANENT PACEMAKER INSERTION;  Surgeon: Thompson Grayer, MD;  Location: [facility] C[facility] LAB;  Service: Cardiovascular;  Laterality: N/A;  . TEE WITHOUT CARDIOVERSION  01/30/2012   Procedure: TRANSESOPHAGEAL ECHOCARDIOGRAM (TEE);  Surgeon: Larey Dresser, MD;  Location: [place];  Service: Cardiovascular;  Laterality: N/A;  ablation next day  . TEE WITHOUT CARDIOVERSION N/A 08/22/2015   Procedure: TRANSESOPHAGEAL ECHOCARDIOGRAM (TEE);  Surgeon: Ivin Poot, MD;  Location: [place];  Service: Open Heart Surgery;  Laterality: N/A;  . US ECHOCARDIOGRAPHY  08/07/2009   EF 55-60%    Family History  Problem Relation Age of Onset  . Heart failure Mother   . Stroke Father     Social History Social History  Substance Use Topics  . Smoking status: Former Smoker    Packs/day: 0.50    Years: 15.00    Types: Cigarettes    Quit date: 07/18/1991  . Smokeless tobacco: Never Used  . Alcohol use Yes     Comment: occasional    Current Outpatient Prescriptions  Medication Sig Dispense Refill  . albuterol (PROVENTIL HFA;VENTOLIN HFA) 108 (90 Base) MCG/ACT inhaler Inhale 2 puffs into the lungs  every 6 (six) hours as needed for wheezing or shortness of breath. 1 Inhaler 5  . amLODipine (NORVASC) 10 MG tablet Take 0.5 tablets (5 mg total) by mouth daily. 30 tablet 11  . atorvastatin (LIPITOR) 40 MG tablet Take 1 tablet (40 mg total) by mouth daily. (Patient taking differently: Take 40 mg by mouth daily at 6 PM. ) 90 tablet 1  . budesonide (PULMICORT) 0.25 MG/2ML nebulizer solution Take 2 mLs (0.25 mg total) by nebulization 2 (two) times daily. (Patient taking differently: Take 0.25 mg by nebulization 2 (two) times daily as needed (for SOB). ) 360 mL 1  . budesonide-formoterol (SYMBICORT) 160-4.5 MCG/ACT inhaler Inhale 2 puffs into the lungs 2 (two) times daily. 1 Inhaler 6  . Cholecalciferol (VITAMIN D) 2000 UNITS CAPS Take 2,000 Units by mouth daily.    . dabigatran (PRADAXA) 150 MG CAPS capsule Take 1 capsule (150 mg total) by mouth 2 (two) times daily.  60 capsule 1  . diphenhydrAMINE (BENADRYL) 25 MG tablet Take 25 mg by mouth at bedtime.    . fexofenadine (ALLEGRA) 180 MG tablet Take 1 tablet (180 mg total) by mouth daily. 30 tablet 1  . fluticasone (FLONASE) 50 MCG/ACT nasal spray Place into both nostrils 2 (two) times daily as needed for allergies or rhinitis.    . furosemide (LASIX) 40 MG tablet Take 1 tablet (40 mg total) by mouth daily. 120 tablet 3  . gabapentin (NEURONTIN) 300 MG capsule Take 600 mg by mouth at bedtime.   3  . guaiFENesin (MUCINEX) 600 MG 12 hr tablet Take 600 mg by mouth 2 (two) times daily.    [name] ipratropium (ATROVENT) 0.02 % nebulizer solution Take 2.5 mLs (0.5 mg total) by nebulization 4 (four) times daily. 300 mL 1  . levalbuterol (XOPENEX) 0.63 MG/3ML nebulizer solution Take 3 mLs (0.63 mg total) by nebulization every 6 (six) hours as needed for wheezing or shortness of breath. (Patient taking differently: Take 0.63 mg by nebulization 2 (two) times daily as needed for wheezing or shortness of breath. ) 360 mL 1  . levothyroxine (SYNTHROID, LEVOTHROID) 175 MCG  tablet Take 175 mcg by mouth daily before breakfast.    . Melatonin-Pyridoxine (MELATONEX) 3-10 MG TBCR Take 1 tablet by mouth at bedtime.    . metFORMIN (GLUMETZA) 500 MG (MOD) 24 hr tablet Take 500 mg by mouth daily with breakfast.    . metoprolol (LOPRESSOR) 50 MG tablet Take 1 tablet (50 mg total) by mouth 2 (two) times daily. 60 tablet 11  . pantoprazole (PROTONIX) 40 MG tablet Take 40 mg by mouth daily.    . potassium chloride (K-DUR) 10 MEQ tablet Take 1 tablet (10 mEq total) by mouth daily. 30 tablet 11  . promethazine (PHENERGAN) 25 MG tablet Take 25 mg by mouth every 6 (six) hours as needed for nausea or vomiting.    [name] rOPINIRole (REQUIP) 1 MG tablet Take 1 tablet (1 mg total) by mouth daily. 90 tablet 2  . spironolactone (ALDACTONE) 25 MG tablet Take 0.5 tablets (12.5 mg total) by mouth daily. 45 tablet 3  . traMADol (ULTRAM) 50 MG tablet Take 1 tablet by mouth daily. Reported on 10/17/2015    . valACYclovir (VALTREX) 1000 MG tablet Take 1 tablet by mouth every 12 (twelve) hours as needed (fever blisters). Reported on 10/17/2015     No current facility-administered medications for this visit.     Allergies  Allergen Reactions  . Meloxicam Rash  . 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 syndrome 09/02/15, resolved with diphenhydramine and slowing of rate    Review of Systems  Surgical incision well-healed Maintaining sinus rhythm for almost a month now after cardioversion Shortness of breath with exertion after diuretic dose was reduced  BP 122/67   Pulse 80   Resp 16   Ht 5\' 11"  (1.803 m)   Wt 210 lb (95.3 kg)   SpO2 91% Comment: ON RA  BMI 29.29 kg/m  Physical Exam Alert and comfortable Breath sounds clear and equal Aortic valve without murmur Heart rhythm regular-sinus  Diagnostic Tests: Chest x-ray performed today personally reviewed and counseled with patient Echocardiogram performed during last months hospitalization personally reviewed and counseled with  patient  Impression: Normal functioning AVR Currently maintaining sinus rhythm Still with symptomatic shortness of breath on exertion due to a combination of his COPD, diastolic LV dysfunction and renal insufficiency. With time after aVR his diastolic function will hopefully  improve.  Plan: No current surgical issues. The patient will be followed by his medical team and return here as needed.   Len Childs, MD [facility] [phone]

## 2016-01-12 ENCOUNTER — Encounter (HOSPITAL_COMMUNITY): Payer: Medicare Other

## 2016-01-15 ENCOUNTER — Telehealth: Payer: Self-pay | Admitting: Cardiovascular Disease

## 2016-01-15 ENCOUNTER — Ambulatory Visit (INDEPENDENT_AMBULATORY_CARE_PROVIDER_SITE_OTHER): Payer: Medicare Other | Admitting: Adult Health

## 2016-01-15 ENCOUNTER — Encounter (HOSPITAL_COMMUNITY): Payer: Medicare Other

## 2016-01-15 ENCOUNTER — Other Ambulatory Visit (INDEPENDENT_AMBULATORY_CARE_PROVIDER_SITE_OTHER): Payer: Medicare Other

## 2016-01-15 ENCOUNTER — Encounter: Payer: Self-pay | Admitting: Adult Health

## 2016-01-15 VITALS — BP 124/64 | HR 80 | Temp 97.8°F | Ht 71.0 in | Wt 215.0 lb

## 2016-01-15 DIAGNOSIS — R0600 Dyspnea, unspecified: Secondary | ICD-10-CM

## 2016-01-15 DIAGNOSIS — J4521 Mild intermittent asthma with (acute) exacerbation: Secondary | ICD-10-CM | POA: Diagnosis not present

## 2016-01-15 LAB — BASIC METABOLIC PANEL
BUN: 24 mg/dL — ABNORMAL HIGH (ref 6–23)
CO2: 28 mEq/L (ref 19–32)
Calcium: 9 mg/dL (ref 8.4–10.5)
Chloride: 103 mEq/L (ref 96–112)
Creatinine, Ser: 1.57 mg/dL — ABNORMAL HIGH (ref 0.40–1.50)
GFR: 47.12 mL/min — ABNORMAL LOW (ref >60.00)
Glucose, Bld: 151 mg/dL — ABNORMAL HIGH (ref 70–99)
Potassium: 4.1 mEq/L (ref 3.5–5.1)
Sodium: 139 mEq/L (ref 135–145)

## 2016-01-15 LAB — BRAIN NATRIURETIC PEPTIDE: Pro B Natriuretic peptide (BNP): 470 pg/mL — ABNORMAL HIGH (ref 0.0–100.0)

## 2016-01-15 NOTE — Telephone Encounter (Signed)
Agree with having the NP in the pulmonary office assess him for O2 needs. We typically do not write for chronic home O2.

## 2016-01-15 NOTE — Telephone Encounter (Signed)
New message      Talk to the nurse----wife states pt is not feeling good and wanted to discuss it with the nurse.  She would not tell me what was exactly going on with the pt

## 2016-01-15 NOTE — Patient Instructions (Signed)
Set up for overnight oximetry test  Take extra Lasix 40mg  for 2 days  Check labs today  Follow up with Dr. Lamonte Sakai  Next month as planned and As needed   Please contact office for sooner follow up if symptoms do not improve or worsen or seek emergency care

## 2016-01-15 NOTE — Assessment & Plan Note (Signed)
Suspect sx are more from volume overload than asthma  Check ONO and labs with bnp .   Plan  Patient Instructions  Set up for overnight oximetry test  Take extra Lasix 40mg  for 2 days  Check labs today  Follow up with Dr. Lamonte Sakai  Next month as planned and As needed   Please contact office for sooner follow up if symptoms do not improve or worsen or seek emergency care

## 2016-01-15 NOTE — Telephone Encounter (Signed)
Spoke with patient's wife who states patient has not been feeling well through the weekend; pt c/o fatigue. She states she is concerned about his medications and whether he needs home oxygen.  I advised that I reviewed Dr. Lucianne Lei Trigt's note from last week and we discussed that his recommendation is that patient be on home oxygen.  Wife states patient is scheduled to see NP at Dr. Agustina Caroli office today.  I advised her to discuss home oxygen today at that appointment and to call back to let me know the decision.  I advised that heart rate and BP were normal at visit with Dr. Prescott Gum.  She states he denies chest pain; states she wanted to discuss with me because Dr. Acie Fredrickson and I have been so helpful through the patient's treatment since surgery.  She also requests Pradaxa samples if available.  I advised I am placing samples at front desk for her to pick up and to call back with questions or concerns.  She thanked me for the call.

## 2016-01-15 NOTE — Progress Notes (Signed)
65 year old male former smoker wiith history of pulmonary hypertension, atrial fibrillation status post ablation and pacemaker, aortic stenosis status post AVR(08/22/15 ) plus MAZA procedure.    TEST  07/2013 PFT that show mixed disease with a definite obstructive component based on a response to bronchodilator 08/05/13. Diffusion corrected for alveolar volume 2-D echo May 2017 showed an EF of 60-65%, aortic valve with mild to moderate regurg, left atrium and right atrium with moderate dilation, pulmonary artery pressure 38    01/15/2016 Acute OV  Presents for an acute office visit for 1 week of minimal cough and DOE. Some wheezing .  Weight is up 18 lbs since July . 5 lbs increase from last week.  Lasix was decreased last week to 40mg  daily b/c scr has increased to 1.9  By cardiology .  CXR last week with mild vascular congestion.  On Symbicort Twice daily   Walk test with no desats on room air.  Had AVR w/ Maze in April . On Pradaxa .  hospitalized 7/27 for DC cardioversion from atrial fibrillation. He had stay in the hospital for aggressive diuresis.  His breathing improved with these maneuvers. Was doing well until last 1-2 weeks.  Denies any fever, chest pain, orthopnea, PND. Disc discolored mucus. Does have some leg swelling.   Past Medical History:  Diagnosis Date  . Aortic stenosis, mild   . Arthritis of hand    "just a little bit in both hands" (03/31/2012)  . Asthma    "little bit" (03/31/2012)  . Atrial fibrillation [facility])    dx '04; DCCV '04, placed on flecainide, failed DCCV 04/2010, flecainide stopped; s/p successful A.fib ablation 01/31/12  . CHF (congestive heart failure) ([place])   . Diabetes mellitus without complication (HCC)    borderline  . DJD (degenerative joint disease)   . Exertional dyspnea   . Fibromyalgia   . First degree AV block    post ablation, ~437ms  . GERD (gastroesophageal reflux disease)   . Heart murmur   . Hyperlipidemia   . Hypertension   .  Hypothyroidism    S/P radiation  . Left atrial enlargement    LA size 88mm by echo 11/21/11  . Mitral regurgitation    trivial  . Obesity   . Obstructive sleep apnea    mild by sleep study 2013; pt stated he does not have a machine because "it wasn't bad enough for him to have one"  . Pacemaker 03/31/2012  . Restless leg syndrome   . Second degree AV block    . Current Outpatient Prescriptions on File Prior to Visit  Medication Sig Dispense Refill  . albuterol (PROVENTIL HFA;VENTOLIN HFA) 108 (90 Base) MCG/ACT inhaler Inhale 2 puffs into the lungs every 6 (six) hours as needed for wheezing or shortness of breath. 1 Inhaler 5  . amLODipine (NORVASC) 10 MG tablet Take 0.5 tablets (5 mg total) by mouth daily. 30 tablet 11  . atorvastatin (LIPITOR) 40 MG tablet Take 1 tablet (40 mg total) by mouth daily. (Patient taking differently: Take 40 mg by mouth daily at 6 PM. ) 90 tablet 1  . budesonide (PULMICORT) 0.25 MG/2ML nebulizer solution Take 2 mLs (0.25 mg total) by nebulization 2 (two) times daily. (Patient taking differently: Take 0.25 mg by nebulization 2 (two) times daily as needed (for SOB). ) 360 mL 1  . budesonide-formoterol (SYMBICORT) 160-4.5 MCG/ACT inhaler Inhale 2 puffs into the lungs 2 (two) times daily. 1 Inhaler 6  . Cholecalciferol (VITAMIN D)  2000 UNITS CAPS Take 2,000 Units by mouth daily.    . dabigatran (PRADAXA) 150 MG CAPS capsule Take 1 capsule (150 mg total) by mouth 2 (two) times daily. 60 capsule 1  . diphenhydrAMINE (BENADRYL) 25 MG tablet Take 25 mg by mouth at bedtime.    . fexofenadine (ALLEGRA) 180 MG tablet Take 1 tablet (180 mg total) by mouth daily. 30 tablet 1  . fluticasone (FLONASE) 50 MCG/ACT nasal spray Place into both nostrils 2 (two) times daily as needed for allergies or rhinitis.    . furosemide (LASIX) 40 MG tablet Take 1 tablet (40 mg total) by mouth daily. 120 tablet 3  . gabapentin (NEURONTIN) 300 MG capsule Take 600 mg by mouth at bedtime.   3  .  guaiFENesin (MUCINEX) 600 MG 12 hr tablet Take 600 mg by mouth 2 (two) times daily.    [name] ipratropium (ATROVENT) 0.02 % nebulizer solution Take 2.5 mLs (0.5 mg total) by nebulization 4 (four) times daily. 300 mL 1  . levalbuterol (XOPENEX) 0.63 MG/3ML nebulizer solution Take 3 mLs (0.63 mg total) by nebulization every 6 (six) hours as needed for wheezing or shortness of breath. (Patient taking differently: Take 0.63 mg by nebulization 2 (two) times daily as needed for wheezing or shortness of breath. ) 360 mL 1  . levothyroxine (SYNTHROID, LEVOTHROID) 175 MCG tablet Take 175 mcg by mouth daily before breakfast.    . Melatonin-Pyridoxine (MELATONEX) 3-10 MG TBCR Take 1 tablet by mouth at bedtime.    . metFORMIN (GLUMETZA) 500 MG (MOD) 24 hr tablet Take 500 mg by mouth daily with breakfast.    . metoprolol (LOPRESSOR) 50 MG tablet Take 1 tablet (50 mg total) by mouth 2 (two) times daily. 60 tablet 11  . pantoprazole (PROTONIX) 40 MG tablet Take 40 mg by mouth daily.    . potassium chloride (K-DUR) 10 MEQ tablet Take 1 tablet (10 mEq total) by mouth daily. 30 tablet 11  . promethazine (PHENERGAN) 25 MG tablet Take 25 mg by mouth every 6 (six) hours as needed for nausea or vomiting.    [name] rOPINIRole (REQUIP) 1 MG tablet Take 1 tablet (1 mg total) by mouth daily. 90 tablet 2  . spironolactone (ALDACTONE) 25 MG tablet Take 0.5 tablets (12.5 mg total) by mouth daily. 45 tablet 3  . traMADol (ULTRAM) 50 MG tablet Take 1 tablet by mouth daily. Reported on 10/17/2015    . valACYclovir (VALTREX) 1000 MG tablet Take 1 tablet by mouth every 12 (twelve) hours as needed (fever blisters). Reported on 10/17/2015     No current facility-administered medications on file prior to visit.    ROS  Constitutional:   No  weight loss, night sweats,  Fevers, chills, + fatigue, or  lassitude.  HEENT:   No headaches,  Difficulty swallowing,  Tooth/dental problems, or  Sore throat,                No sneezing, itching, ear  ache, + nasal congestion, post nasal drip,   CV:  No chest pain,  Orthopnea, PND, swelling in lower extremities, anasarca, dizziness, palpitations, syncope.   GI  No heartburn, indigestion, abdominal pain, nausea, vomiting, diarrhea, change in bowel habits, loss of appetite, bloody stools.   Resp:   No chest wall deformity  Skin: no rash or lesions.  GU: no dysuria, change in color of urine, no urgency or frequency.  No flank pain, no hematuria   MS:  No joint pain or swelling.  No decreased range of motion.  No back pain.  Psych:  No change in mood or affect. No depression or anxiety.  No memory loss.     EXAM  . Vitals:   01/15/16 1618  BP: 124/64  Pulse: 80  Temp: 97.8 F (36.6 C)  TempSrc: Oral  SpO2: 92%  Weight: 215 lb (97.5 kg)  Height: 5\' 11"  (1.803 m)   GEN: A/Ox3; pleasant , NAD, elderly    HEENT:  [place]/AT,  EACs-clear, TMs-wnl, NOSE-clear, THROAT-clear, no lesions, no postnasal drip or exudate noted.   NECK:  Supple w/ fair ROM; no JVD; normal carotid impulses w/o bruits; no thyromegaly or nodules palpated; no lymphadenopathy.    RESP  Decreased BS in bases w/o wheezes/ rales/ or rhonchi. no accessory muscle use, no dullness to percussion  CARD:  RRR,  + SM  , tr  peripheral edema, pulses intact, no cyanosis or clubbing.  GI:   Soft & nt; nml bowel sounds; no organomegaly or masses detected.   Musco: Warm bil, no deformities or joint swelling noted.   Neuro: alert, no focal deficits noted.    Skin: Warm, no lesions or rashes   Tammy Parrett NP-C  [facility] 01/15/2016

## 2016-01-15 NOTE — Addendum Note (Signed)
Addended by: Osa Craver on: 01/15/2016 05:03 PM   Modules accepted: Orders

## 2016-01-16 NOTE — Progress Notes (Signed)
Called spoke with pt's wife. Reviewed results and recs. She voiced understanding and had no further questions.

## 2016-01-17 ENCOUNTER — Encounter (HOSPITAL_COMMUNITY): Payer: Medicare Other

## 2016-01-19 ENCOUNTER — Encounter (HOSPITAL_COMMUNITY): Payer: Medicare Other

## 2016-01-22 ENCOUNTER — Encounter (HOSPITAL_COMMUNITY): Payer: Medicare Other

## 2016-01-23 NOTE — Telephone Encounter (Signed)
Patient being followed by pulmonary care for O2 needs

## 2016-01-24 ENCOUNTER — Encounter (HOSPITAL_COMMUNITY): Payer: Medicare Other

## 2016-01-26 ENCOUNTER — Encounter (HOSPITAL_COMMUNITY): Payer: Medicare Other

## 2016-01-29 ENCOUNTER — Encounter (HOSPITAL_COMMUNITY): Payer: Medicare Other

## 2016-01-29 ENCOUNTER — Encounter: Payer: Self-pay | Admitting: Internal Medicine

## 2016-01-29 ENCOUNTER — Encounter: Payer: Self-pay | Admitting: Nurse Practitioner

## 2016-01-29 ENCOUNTER — Ambulatory Visit (INDEPENDENT_AMBULATORY_CARE_PROVIDER_SITE_OTHER): Payer: Medicare Other | Admitting: Nurse Practitioner

## 2016-01-29 ENCOUNTER — Telehealth: Payer: Self-pay | Admitting: Cardiovascular Disease

## 2016-01-29 VITALS — BP 140/78 | HR 80 | Ht 71.0 in | Wt 214.8 lb

## 2016-01-29 DIAGNOSIS — R0902 Hypoxemia: Secondary | ICD-10-CM

## 2016-01-29 DIAGNOSIS — R0602 Shortness of breath: Secondary | ICD-10-CM | POA: Diagnosis not present

## 2016-01-29 DIAGNOSIS — I480 Paroxysmal atrial fibrillation: Secondary | ICD-10-CM | POA: Diagnosis not present

## 2016-01-29 LAB — HEPATIC FUNCTION PANEL
ALT: 14 U/L (ref 9–46)
AST: 20 U/L (ref 10–35)
Albumin: 4 g/dL (ref 3.6–5.1)
Alkaline Phosphatase: 97 U/L (ref 40–115)
Bilirubin, Direct: 0.4 mg/dL — ABNORMAL HIGH (ref <=0.2)
Indirect Bilirubin: 0.6 mg/dL (ref 0.2–1.2)
Total Bilirubin: 1 mg/dL (ref 0.2–1.2)
Total Protein: 6.5 g/dL (ref 6.1–8.1)

## 2016-01-29 LAB — CBC
HCT: 29.9 % — ABNORMAL LOW (ref 38.5–50.0)
Hemoglobin: 9.2 g/dL — ABNORMAL LOW (ref 13.2–17.1)
MCH: 24.6 pg — ABNORMAL LOW (ref 27.0–33.0)
MCHC: 30.8 g/dL — ABNORMAL LOW (ref 32.0–36.0)
MCV: 79.9 fL — ABNORMAL LOW (ref 80.0–100.0)
MPV: 10.1 fL (ref 7.5–12.5)
Platelets: 245 10*3/uL (ref 140–400)
RBC: 3.74 MIL/uL — ABNORMAL LOW (ref 4.20–5.80)
RDW: 19.3 % — ABNORMAL HIGH (ref 11.0–15.0)
WBC: 9 10*3/uL (ref 3.8–10.8)

## 2016-01-29 LAB — BASIC METABOLIC PANEL
BUN: 19 mg/dL (ref 7–25)
CO2: 28 mmol/L (ref 20–31)
Calcium: 8.8 mg/dL (ref 8.6–10.3)
Chloride: 102 mmol/L (ref 98–110)
Creat: 1.65 mg/dL — ABNORMAL HIGH (ref 0.70–1.25)
Glucose, Bld: 71 mg/dL (ref 65–99)
Potassium: 3.8 mmol/L (ref 3.5–5.3)
Sodium: 140 mmol/L (ref 135–146)

## 2016-01-29 NOTE — Telephone Encounter (Signed)
New message    Wife calling  Patient need to be seen today.     Pt c/o Shortness Of Breath: STAT if SOB developed within the last 24 hours or pt is noticeably SOB on the phone  1. Are you currently SOB (can you hear that pt is SOB on the phone)? Wife calling  - patient at home   2. How long have you been experiencing SOB? Per Wife - All the time   3. Are you SOB when sitting or when up moving around? Per wife - both    4. Are you currently experiencing any other symptoms?per wife - swelling     Pt c/o swelling: STAT is pt has developed SOB within 24 hours  1. How long have you been experiencing swelling? Per wife - always   2. Where is the swelling located? Bottom both  legs are dark   3.  Are you currently taking a "fluid pill"?per wife -yes - 20 mg   4.  Are you currently SOB? Per wife  - yes   5.  Have you traveled recently? Per wife- no

## 2016-01-29 NOTE — Progress Notes (Addendum)
CARDIOLOGY OFFICE NOTE  Date:  01/29/2016    [name] Date of Birth: February 27, 1951 Medical Record [id number]  PCP:  Velna Hatchet, MD  Cardiologist:  Nahser  Chief Complaint  Patient presents with  . Shortness of Breath    Work in visit - seen for Dr. Acie Fredrickson    History of Present Illness: [name] is a 64 y.o. male who presents today for a work in visit. Seen for Dr. Acie Fredrickson.   He has a history of aortic stenosis - s/p AVR + MAZE procedure per PVT back in April of 2017. Other issues include HTN, normal cors by cath in 2014, pulmonary HTN, PAF with prior ablation and has PPM in place, HLD and fibromyalgia. He is a former smoker.   Has basically done poorly since his AVR - continued issues with dyspnea in the setting of COPD, diastolic HF and CKD. Cardioverted back at the end of July - had to stay overnight for diuresis.   Seen by pulmonary about 2 weeks ago - noted significant weight gain. He remains on chronic Pradaxa. Was suppose to have an overnight oximetry - this has NOT been completed.   Comes in today. Here with his wife. He has not done well for the past 3 weeks - describes it as if "someone turned a switch". More short of breath. Can't sleep. Can't lie flat. More PND/orthopnea. Weight is up a few pounds. No real cough. No chest pain. No palpitations. Very fatigued. Very frustrated.  Have not heard anything about his overnight oximetry. No real swelling. Stools look ok. No fever.   Past Medical History:  Diagnosis Date  . Aortic stenosis, mild   . Arthritis of hand    "just a little bit in both hands" (03/31/2012)  . Asthma    "little bit" (03/31/2012)  . Atrial fibrillation [facility])    dx '04; DCCV '04, placed on flecainide, failed DCCV 04/2010, flecainide stopped; s/p successful A.fib ablation 01/31/12  . CHF (congestive heart failure) ([place])   . Diabetes mellitus without complication (HCC)    borderline  . DJD (degenerative joint disease)   . Exertional  dyspnea   . Fibromyalgia   . First degree AV block    post ablation, ~456ms  . GERD (gastroesophageal reflux disease)   . Heart murmur   . Hyperlipidemia   . Hypertension   . Hypothyroidism    S/P radiation  . Left atrial enlargement    LA size 9mm by echo 11/21/11  . Mitral regurgitation    trivial  . Obesity   . Obstructive sleep apnea    mild by sleep study 2013; pt stated he does not have a machine because "it wasn't bad enough for him to have one"  . Pacemaker 03/31/2012  . Restless leg syndrome   . Second degree AV block     Past Surgical History:  Procedure Laterality Date  . AORTIC VALVE REPLACEMENT N/A 08/22/2015   Procedure: AORTIC VALVE REPLACEMENT (AVR);  Surgeon: Ivin Poot, MD;  Location: [place];  Service: Open Heart Surgery;  Laterality: N/A;  . ATRIAL FIBRILLATION ABLATION  01/30/2012   PVI by Dr. Rayann Heman  . ATRIAL FIBRILLATION ABLATION N/A 01/31/2012   Procedure: ATRIAL FIBRILLATION ABLATION;  Surgeon: Thompson Grayer, MD;  Location: [facility] CATH LAB;  Service: Cardiovascular;  Laterality: N/A;  . BACK SURGERY     X 3  . CARDIAC CATHETERIZATION N/A 08/09/2015   Procedure: Right/Left Heart Cath and Coronary Angiography;  Surgeon: Peter M Martinique, MD;  Location: [place] CV LAB;  Service: Cardiovascular;  Laterality: N/A;  . CARDIOVASCULAR STRESS TEST  03/12/2002   EF 48%, NO EVIDENCE OF ISCHEMIA  . CARDIOVERSION  01/2012; 03/31/2012  . CARDIOVERSION N/A 02/25/2012   Procedure: CARDIOVERSION;  Surgeon: Thompson Grayer, MD;  Location: [facility] CATH LAB;  Service: Cardiovascular;  Laterality: N/A;  . CARDIOVERSION N/A 03/31/2012   Procedure: CARDIOVERSION;  Surgeon: Thompson Grayer, MD;  Location: [facility] CATH LAB;  Service: Cardiovascular;  Laterality: N/A;  . CARDIOVERSION N/A 11/23/2015   Procedure: CARDIOVERSION;  Surgeon: Larey Dresser, MD;  Location: [place];  Service: Cardiovascular;  Laterality: N/A;  . CLIPPING OF ATRIAL APPENDAGE N/A 08/22/2015   Procedure: CLIPPING OF  ATRIAL APPENDAGE;  Surgeon: Ivin Poot, MD;  Location: [place];  Service: Open Heart Surgery;  Laterality: N/A;  . DOPPLER ECHOCARDIOGRAPHY  03/11/2002   EF 70-75%  . FINGER SURGERY Left    Middle finger  . FINGER TENDON REPAIR  1980's   "right little finger" (03/31/2012)  . INSERT / REPLACE / REMOVE PACEMAKER     [employer]  . LEFT AND RIGHT HEART CATHETERIZATION WITH CORONARY ANGIOGRAM N/A 10/27/2012   Procedure: LEFT AND RIGHT HEART CATHETERIZATION WITH CORONARY ANGIOGRAM;  Surgeon: Laverda Page, MD;  Location: [facility] CATH LAB;  Service: Cardiovascular;  Laterality: N/A;  . LUMBAR DISC SURGERY  1980's X2;  2000's  . MAZE N/A 08/22/2015   Procedure: MAZE;  Surgeon: Ivin Poot, MD;  Location: [place];  Service: Open Heart Surgery;  Laterality: N/A;  . PACEMAKER INSERTION  03/31/2012   STJ Accent DR pacemaker implanted by Dr Rayann Heman  . PERMANENT PACEMAKER INSERTION N/A 03/31/2012   Procedure: PERMANENT PACEMAKER INSERTION;  Surgeon: Thompson Grayer, MD;  Location: [facility] CATH LAB;  Service: Cardiovascular;  Laterality: N/A;  . TEE WITHOUT CARDIOVERSION  01/30/2012   Procedure: TRANSESOPHAGEAL ECHOCARDIOGRAM (TEE);  Surgeon: Larey Dresser, MD;  Location: [place];  Service: Cardiovascular;  Laterality: N/A;  ablation next day  . TEE WITHOUT CARDIOVERSION N/A 08/22/2015   Procedure: TRANSESOPHAGEAL ECHOCARDIOGRAM (TEE);  Surgeon: Ivin Poot, MD;  Location: [place];  Service: Open Heart Surgery;  Laterality: N/A;  . US ECHOCARDIOGRAPHY  08/07/2009   EF 55-60%     Medications: Current Outpatient Prescriptions  Medication Sig Dispense Refill  . albuterol (PROVENTIL HFA;VENTOLIN HFA) 108 (90 Base) MCG/ACT inhaler Inhale 2 puffs into the lungs every 6 (six) hours as needed for wheezing or shortness of breath. 1 Inhaler 5  . amLODipine (NORVASC) 10 MG tablet Take 0.5 tablets (5 mg total) by mouth daily. 30 tablet 11  . atorvastatin (LIPITOR) 40 MG tablet Take 1 tablet (40 mg total) by mouth  daily. (Patient taking differently: Take 40 mg by mouth daily at 6 PM. ) 90 tablet 1  . budesonide (PULMICORT) 0.25 MG/2ML nebulizer solution Take 2 mLs (0.25 mg total) by nebulization 2 (two) times daily. (Patient taking differently: Take 0.25 mg by nebulization 2 (two) times daily as needed (for SOB). ) 360 mL 1  . budesonide-formoterol (SYMBICORT) 160-4.5 MCG/ACT inhaler Inhale 2 puffs into the lungs 2 (two) times daily. 1 Inhaler 6  . Cholecalciferol (VITAMIN D) 2000 UNITS CAPS Take 2,000 Units by mouth daily.    . dabigatran (PRADAXA) 150 MG CAPS capsule Take 1 capsule (150 mg total) by mouth 2 (two) times daily. 60 capsule 1  . diphenhydrAMINE (BENADRYL) 25 MG tablet Take 25 mg by mouth at bedtime.    [name]  fexofenadine (ALLEGRA) 180 MG tablet Take 1 tablet (180 mg total) by mouth daily. 30 tablet 1  . fluticasone (FLONASE) 50 MCG/ACT nasal spray Place into both nostrils 2 (two) times daily as needed for allergies or rhinitis.    . furosemide (LASIX) 40 MG tablet Take 1 tablet (40 mg total) by mouth daily. 120 tablet 3  . gabapentin (NEURONTIN) 300 MG capsule Take 900 mg by mouth at bedtime.   3  . guaiFENesin (MUCINEX) 600 MG 12 hr tablet Take 600 mg by mouth 2 (two) times daily.    [name] ipratropium (ATROVENT) 0.02 % nebulizer solution Take 2.5 mLs (0.5 mg total) by nebulization 4 (four) times daily. 300 mL 1  . levalbuterol (XOPENEX) 0.63 MG/3ML nebulizer solution Take 3 mLs (0.63 mg total) by nebulization every 6 (six) hours as needed for wheezing or shortness of breath. (Patient taking differently: Take 0.63 mg by nebulization 2 (two) times daily as needed for wheezing or shortness of breath. ) 360 mL 1  . levothyroxine (SYNTHROID, LEVOTHROID) 175 MCG tablet Take 175 mcg by mouth daily before breakfast.    . Melatonin-Pyridoxine (MELATONEX) 3-10 MG TBCR Take 1 tablet by mouth at bedtime.    . metFORMIN (GLUMETZA) 500 MG (MOD) 24 hr tablet Take 500 mg by mouth daily with breakfast.    . metoprolol  (LOPRESSOR) 50 MG tablet Take 1 tablet (50 mg total) by mouth 2 (two) times daily. 60 tablet 11  . pantoprazole (PROTONIX) 40 MG tablet Take 40 mg by mouth daily.    . potassium chloride (K-DUR) 10 MEQ tablet Take 1 tablet (10 mEq total) by mouth daily. 30 tablet 11  . promethazine (PHENERGAN) 25 MG tablet Take 25 mg by mouth every 6 (six) hours as needed for nausea or vomiting.    [name] rOPINIRole (REQUIP) 1 MG tablet Take 1 tablet (1 mg total) by mouth daily. 90 tablet 2  . spironolactone (ALDACTONE) 25 MG tablet Take 0.5 tablets (12.5 mg total) by mouth daily. 45 tablet 3  . traMADol (ULTRAM) 50 MG tablet Take 1 tablet by mouth daily. Reported on 10/17/2015    . valACYclovir (VALTREX) 1000 MG tablet Take 1 tablet by mouth every 12 (twelve) hours as needed (fever blisters). Reported on 10/17/2015     No current facility-administered medications for this visit.     Allergies: Allergies  Allergen Reactions  . Meloxicam Rash  . 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 syndrome 09/02/15, resolved with diphenhydramine and slowing of rate    Social History: The patient  reports that he quit smoking about 24 years ago. His smoking use included Cigarettes. He has a 7.50 pack-year smoking history. He has never used smokeless tobacco. He reports that he drinks alcohol. He reports that he uses drugs, including Marijuana.   Family History: The patient's family history includes Heart failure in his mother; Stroke in his father.   Review of Systems: Please see the history of present illness.   Otherwise, the review of systems is positive for none.   All other systems are reviewed and negative.   Physical Exam: VS:  BP 140/78   Pulse 80   Ht 5\' 11"  (1.803 m)   Wt 214 lb 12.8 oz (97.4 kg)   SpO2 (!) 86% Comment: walked in/83 with two feet walking/than 79/now 100 with 2 L  BMI 29.96 kg/m  .  BMI Body mass index is 29.96 kg/m.  Wt Readings from Last 3 Encounters:  01/29/16 214 lb  12.8 oz (97.4  kg)  01/15/16 215 lb (97.5 kg)  01/10/16 210 lb (95.3 kg)    The oxygen sat was 79% on RA at rest here in the office. Placed on oxygen at 2 liters and at rest comes up to 85% subsequently up into the 90's with improvement in symptoms.    General: Pleasant. He was very short of breath with walking into the office today.  His weight is up by 6 pounds.  HEENT: Normal.  Neck: Supple, no JVD, carotid bruits, or masses noted.  Cardiac: Regular rate and rhythm. Faint outflow murmur. No significant edema.  Respiratory:  Lungs with very decreased breath sounds - more so on the left - with increased work of breathing - this improved with administration of oxygen at 2 liters.  GI: Soft and nontender.  MS: No deformity or atrophy. Gait and ROM intact.  Skin: Warm and dry. Color is normal.  Neuro:  Strength and sensation are intact and no gross focal deficits noted.  Psych: Alert, appropriate and with normal affect.   LABORATORY DATA:  EKG:  EKG is ordered today. This demonstrates a paced rhythm.  A Merlin transmission was obtained - reviewed with Dr. Rayann Heman - he does not feel like he is in AF at this time - but sensing inappropriately - will ask industry to come and reprogram.   Lab Results  Component Value Date   WBC 9.5 11/21/2015   HGB 9.3 (L) 11/21/2015   HCT 30.5 (L) 11/21/2015   PLT 266 11/21/2015   GLUCOSE 151 (H) 01/15/2016   CHOL 114 (L) 08/03/2015   TRIG 111 08/03/2015   HDL 30 (L) 08/03/2015   LDLCALC 62 08/03/2015   ALT 17 11/24/2015   AST 20 11/24/2015   NA 139 01/15/2016   K 4.1 01/15/2016   CL 103 01/15/2016   CREATININE 1.57 (H) 01/15/2016   BUN 24 (H) 01/15/2016   CO2 28 01/15/2016   TSH 0.441 09/04/2015   INR 1.2 (H) 11/21/2015   HGBA1C 5.9 (H) 11/23/2015    BNP (last 3 results)  Recent Labs  09/02/15 2011 11/23/15 1432  BNP 206.2* 159.2*    ProBNP (last 3 results)  Recent Labs  11/07/15 1155 01/15/16 1708  PROBNP 178.0* 470.0*     Other  Studies Reviewed Today:  Echo Study Conclusions from 10/2015  - Left ventricle: The cavity size was normal. Wall thickness was   increased in a pattern of mild LVH. Systolic function was normal.   The estimated ejection fraction was in the range of 60% to 65%.   Indeterminant diastolic function. Wall motion was normal; there   were no regional wall motion abnormalities. - Ventricular septum: Respirophasic variation of the   interventricular septum was noted (respirometry was not done   however). - Aortic valve: Bioprosthetic aortic valve. No bioprosthetic aortic   valve stenosis. There was trivial regurgitation. Mean gradient   (S): 10 mm Hg. - Mitral valve: Mildly calcified annulus. There was mild   regurgitation. - Left atrium: The atrium was mildly dilated. - Right ventricle: The cavity size was mildly dilated. Pacer wire   or catheter noted in right ventricle. Systolic function was   mildly reduced. - Right atrium: The atrium was mildly to moderately dilated. - Pulmonary arteries: No complete TR doppler jet so unable to   estimate PA systolic pressure. - Inferior vena cava: The vessel was normal in size. The   respirophasic diameter changes were in the normal range (>=  50%),   consistent with normal central venous pressure.  Impressions:  - Normal LV size with mild LV hypertrophy. EF 60-65%. Mildly   dilated RV with mildly decreased systolic function. There   appeared to be respirophasic variation of the interventricular   septum. This can be seen with percardial constriction, but the   pericardium did not appear thickened and the IVC was not   enlarged. The bioprosthetic aortic valve appeared to function   normally.   Procedures   Right/Left Heart Cath and Coronary Angiography 07/2015  Conclusion    Mid RCA lesion, 10% stenosed.  Ost Cx to Prox Cx lesion, 20% stenosed.  The left ventricular systolic function is normal.   1. No significant CAD 2. Normal LV  function 3. Severe aortic stenosis. Mean gradient 40 mm Hg. Index 0.58 4. Moderate pulmonary HTN with elevated LV filling pressures. 5. Normal cardiac output  Plan: referral to CT surgery for AVR.    CXR FINDINGS 01/10/2016: Sternotomy wires and left-sided pacemaker unchanged. Prosthetic aortic valve unchanged. Lungs are adequately inflated and demonstrate hazy prominence of the perihilar markings suggesting mild vascular congestion/edema. No significant effusion. Mild stable cardiomegaly. Calcified plaque over the aortic arch. Remainder of the exam is unchanged.  IMPRESSION: Mild stable cardiomegaly with suggestion of mild vascular congestion/edema.   Electronically Signed   By: Marin Olp M.D.   On: 01/10/2016 09:39  Assessment/Plan:  1. Chronic DOE - quite hypoxic here in the office today - placed on oxygen with immediate improvement in his oxygen saturations. Arranging for home oxygen at 2 liters. Lab today. Increase diuretics to 80 mg for 3 days.   2. HTN - BP fair - better at home.   3. Prior AVR/MAZE - recent echo noted.   4. Pulmonary HTN - Pulmonary HTN by cath - PA pressures were in the 50's.  His pulmonary artery pressures have improved to 38 mmHg following his surgery.  5. PAF - s/p MAZE and has had recent cardioversion - does not appear to be in AF today - getting industry to recheck device and reprogram for possible sensing issue after discussion with Dr. Rayann Heman. Noted to have far field oversensing on his device check. This was reprogrammed. Base rate also taken to 60.   6. PPM in place - see #5  7. Anemia - noted on last lab from July - needs updating. Getting labs today.    Current medicines are reviewed with the patient today.  The patient does not have concerns regarding medicines other than what has been noted above.  The following changes have been made:  See above.  Labs/ tests ordered today include:    Orders Placed This Encounter    Procedures  . Brain natriuretic peptide  . CBC  . Hepatic function panel  . Basic metabolic panel  . EKG 12-Lead     Disposition:   FU with me in one week.   Patient is agreeable to this plan and will call if any problems develop in the interim.   Signed: Burtis Junes, RN, ANP-C 01/29/2016 3:16 PM  [facility] [address] [place], [place]  [address] Phone: [phone] Fax: [phone]

## 2016-01-29 NOTE — Addendum Note (Signed)
Addended by: Burtis Junes on: 01/29/2016 04:16 PM   Modules accepted: Orders

## 2016-01-29 NOTE — Telephone Encounter (Signed)
Patient is scheduled to see Truitt Merle, NP at 3:30 today, 10/2.  Patient's wife, Adonis Brook, is aware.

## 2016-01-29 NOTE — Telephone Encounter (Signed)
Lets see if he can be seen by [name] today  Consider CT angiogram for evaluation for PE.   Further plans per [name]

## 2016-01-29 NOTE — Patient Instructions (Addendum)
We will be checking the following labs today - BNP, BMET, CBC, HPF   Medication Instructions:    Continue with your current medicines. BUT  I want you to take 2 of your fluid pills daily for the next 3 days.     Testing/Procedures To Be Arranged:  N/A  Follow-Up:   See me in one week.     Other Special Instructions:   We are going to arrange home oxygen  We are going to get your device reprogrammed    If you need a refill on your cardiac medications before your next appointment, please call your pharmacy.   Call the [place] office at [phone] if you have any questions, problems or concerns.

## 2016-01-29 NOTE — Telephone Encounter (Signed)
Received called in triage. Spoke with pt's wife.   Pt experiencing SOB/swelling. Started 2 weeks ago. Pt's wife said she talked with our office 1  ago.  Leg are swollen and darker in color (this has been for the past 4-5 days). Wife says pt is alert and oriented. Pt's O2 sat at 8:00 AM was 94% on room air.   Pt went to pulmonologist 2 weeks ago. The pulmonologist said SOB is not lung related it is heart related.   Forwarded information to Dr. Acie Fredrickson and Sharyn Lull, RN.

## 2016-01-29 NOTE — Telephone Encounter (Signed)
Spoke with patient's wife to get additional information about the patient's condition.  She states patient is feeling very poor.  States he is unable to go anywhere and sleeps slumped over forward, which he states is the only comfortable position.  She reports he is currently taking furosemide 60 mg daily.  He was advised to take 80 mg daily (40 mg BID) by pulmonology on 9/18.  Patient's wife states he did see some improvement.  States current weight is 208 lb at home.  She states his bilateral lower extremities are darker and swollen; patient c/o leg pain; states he cannot tolerate compression stockings.  States O2 sats are 90% on room air; pulmonology did not contact patient about pulse oximetry monitoring.  Patient's wife states pulmonology said patient's symptoms are related to heart failure, not lungs. I advised her to give patient an additional 40 mg furosemide this morning and that I will discuss with Dr. Acie Fredrickson and call her back with his advice.  She verbalized understanding and agreement.

## 2016-01-30 ENCOUNTER — Other Ambulatory Visit: Payer: Self-pay | Admitting: Cardiovascular Disease

## 2016-01-30 ENCOUNTER — Other Ambulatory Visit: Payer: Self-pay | Admitting: *Deleted

## 2016-01-30 DIAGNOSIS — R0602 Shortness of breath: Secondary | ICD-10-CM

## 2016-01-30 LAB — BRAIN NATRIURETIC PEPTIDE: Brain Natriuretic Peptide: 406.6 pg/mL — ABNORMAL HIGH (ref <100)

## 2016-01-30 NOTE — Addendum Note (Signed)
Encounter addended by: Sol Passer on: 01/30/2016 10:18 AM<BR>    Actions taken: Visit Navigator Flowsheet section accepted, Vitals modified

## 2016-01-31 ENCOUNTER — Encounter (HOSPITAL_COMMUNITY): Payer: Medicare Other

## 2016-02-01 ENCOUNTER — Telehealth: Payer: Self-pay | Admitting: Nurse Practitioner

## 2016-02-01 ENCOUNTER — Encounter (HOSPITAL_COMMUNITY): Payer: Self-pay | Admitting: *Deleted

## 2016-02-01 ENCOUNTER — Telehealth: Payer: Self-pay | Admitting: *Deleted

## 2016-02-01 ENCOUNTER — Ambulatory Visit
Admission: RE | Admit: 2016-02-01 | Discharge: 2016-02-01 | Disposition: A | Discharge status: Home / Self Care | Payer: Medicare Other | Source: Ambulatory Visit | Attending: Nurse Practitioner | Admitting: Nurse Practitioner

## 2016-02-01 DIAGNOSIS — R0602 Shortness of breath: Secondary | ICD-10-CM

## 2016-02-01 NOTE — Telephone Encounter (Signed)
S/w pt's wife per DPR wanted to let Truitt Merle, NP, know that pt had cxr today and pt is not feeling any better.  I phoned [name] to let her know no answer.  Stated if does not hear back from our office to keep appointment on Monday.  S/w Sammuel Bailiff, RN, stated if pt is not feeling any better bring him back in tomorrow. I will wait to hear from [place].

## 2016-02-01 NOTE — Telephone Encounter (Signed)
[name] called back stated has nothing else to offer pt except to see if Dr. Acie Fredrickson wants to see pt tomorrow otherwise [name] will see pt back Monday.  Gave ov note to Christen Bame, RN stated to see what can be done.

## 2016-02-01 NOTE — Telephone Encounter (Signed)
New message    Pt wife verbalized that she is calling because pt has Xrays and she wants to talk to RN about pt care

## 2016-02-01 NOTE — Telephone Encounter (Signed)
Reviewed case with Dr. Acie Fredrickson who advised we order an echo for evaluation of continued SOB despite O2 administration.  Patient's wife states patient remains extremely SOB with any movement.  She states his O2 sats on 2 L oxygen 91%.  Patient is scheduled for echo at 7:30 am tomorrow 10/6.  I advised her that we will evaluate results and plan care based on those results.  She verbalized understanding and agreement and thanked me for the call.

## 2016-02-02 ENCOUNTER — Encounter (HOSPITAL_COMMUNITY): Payer: Medicare Other

## 2016-02-02 ENCOUNTER — Ambulatory Visit (INDEPENDENT_AMBULATORY_CARE_PROVIDER_SITE_OTHER): Payer: Medicare Other | Admitting: Cardiovascular Disease

## 2016-02-02 ENCOUNTER — Inpatient Hospital Stay (HOSPITAL_COMMUNITY)
Admission: AD | Admit: 2016-02-02 | Discharge: 2016-02-08 | DRG: 287 | Disposition: A | Discharge status: Home / Self Care | Payer: Medicare Other | Source: Ambulatory Visit | Attending: Cardiovascular Disease | Admitting: Cardiovascular Disease

## 2016-02-02 ENCOUNTER — Ambulatory Visit (HOSPITAL_BASED_OUTPATIENT_CLINIC_OR_DEPARTMENT_OTHER): Payer: Medicare Other

## 2016-02-02 ENCOUNTER — Ambulatory Visit (INDEPENDENT_AMBULATORY_CARE_PROVIDER_SITE_OTHER)
Admission: RE | Admit: 2016-02-02 | Discharge: 2016-02-02 | Disposition: A | Discharge status: Home / Self Care | Payer: Medicare Other | Source: Ambulatory Visit | Attending: Cardiovascular Disease | Admitting: Cardiovascular Disease

## 2016-02-02 ENCOUNTER — Encounter: Payer: Self-pay | Admitting: Cardiovascular Disease

## 2016-02-02 ENCOUNTER — Other Ambulatory Visit: Payer: Self-pay

## 2016-02-02 VITALS — BP 132/66 | HR 75 | Ht 71.0 in | Wt 218.6 lb

## 2016-02-02 DIAGNOSIS — Z923 Personal history of irradiation: Secondary | ICD-10-CM

## 2016-02-02 DIAGNOSIS — E669 Obesity, unspecified: Secondary | ICD-10-CM | POA: Diagnosis present

## 2016-02-02 DIAGNOSIS — Z7951 Long term (current) use of inhaled steroids: Secondary | ICD-10-CM | POA: Diagnosis not present

## 2016-02-02 DIAGNOSIS — E890 Postprocedural hypothyroidism: Secondary | ICD-10-CM | POA: Diagnosis present

## 2016-02-02 DIAGNOSIS — I351 Nonrheumatic aortic (valve) insufficiency: Secondary | ICD-10-CM | POA: Diagnosis present

## 2016-02-02 DIAGNOSIS — Z952 Presence of prosthetic heart valve: Secondary | ICD-10-CM | POA: Diagnosis not present

## 2016-02-02 DIAGNOSIS — D649 Anemia, unspecified: Secondary | ICD-10-CM | POA: Diagnosis present

## 2016-02-02 DIAGNOSIS — Z7984 Long term (current) use of oral hypoglycemic drugs: Secondary | ICD-10-CM

## 2016-02-02 DIAGNOSIS — M797 Fibromyalgia: Secondary | ICD-10-CM | POA: Diagnosis present

## 2016-02-02 DIAGNOSIS — G2581 Restless legs syndrome: Secondary | ICD-10-CM | POA: Diagnosis present

## 2016-02-02 DIAGNOSIS — I2510 Atherosclerotic heart disease of native coronary artery without angina pectoris: Secondary | ICD-10-CM | POA: Diagnosis present

## 2016-02-02 DIAGNOSIS — R0609 Other forms of dyspnea: Secondary | ICD-10-CM | POA: Diagnosis present

## 2016-02-02 DIAGNOSIS — I481 Persistent atrial fibrillation: Secondary | ICD-10-CM | POA: Diagnosis not present

## 2016-02-02 DIAGNOSIS — I5030 Unspecified diastolic (congestive) heart failure: Secondary | ICD-10-CM | POA: Diagnosis present

## 2016-02-02 DIAGNOSIS — Z87891 Personal history of nicotine dependence: Secondary | ICD-10-CM

## 2016-02-02 DIAGNOSIS — Z8249 Family history of ischemic heart disease and other diseases of the circulatory system: Secondary | ICD-10-CM

## 2016-02-02 DIAGNOSIS — G4733 Obstructive sleep apnea (adult) (pediatric): Secondary | ICD-10-CM | POA: Diagnosis present

## 2016-02-02 DIAGNOSIS — K219 Gastro-esophageal reflux disease without esophagitis: Secondary | ICD-10-CM | POA: Diagnosis present

## 2016-02-02 DIAGNOSIS — R0602 Shortness of breath: Secondary | ICD-10-CM

## 2016-02-02 DIAGNOSIS — I272 Pulmonary hypertension, unspecified: Secondary | ICD-10-CM | POA: Diagnosis present

## 2016-02-02 DIAGNOSIS — Z950 Presence of cardiac pacemaker: Secondary | ICD-10-CM

## 2016-02-02 DIAGNOSIS — Z6830 Body mass index (BMI) 30.0-30.9, adult: Secondary | ICD-10-CM

## 2016-02-02 DIAGNOSIS — E119 Type 2 diabetes mellitus without complications: Secondary | ICD-10-CM | POA: Diagnosis present

## 2016-02-02 DIAGNOSIS — Z823 Family history of stroke: Secondary | ICD-10-CM

## 2016-02-02 DIAGNOSIS — Z7902 Long term (current) use of antithrombotics/antiplatelets: Secondary | ICD-10-CM

## 2016-02-02 DIAGNOSIS — I480 Paroxysmal atrial fibrillation: Secondary | ICD-10-CM | POA: Diagnosis present

## 2016-02-02 DIAGNOSIS — I5033 Acute on chronic diastolic (congestive) heart failure: Secondary | ICD-10-CM | POA: Diagnosis present

## 2016-02-02 DIAGNOSIS — I350 Nonrheumatic aortic (valve) stenosis: Secondary | ICD-10-CM

## 2016-02-02 DIAGNOSIS — I110 Hypertensive heart disease with heart failure: Secondary | ICD-10-CM | POA: Diagnosis present

## 2016-02-02 DIAGNOSIS — R262 Difficulty in walking, not elsewhere classified: Secondary | ICD-10-CM

## 2016-02-02 DIAGNOSIS — I5032 Chronic diastolic (congestive) heart failure: Secondary | ICD-10-CM | POA: Diagnosis present

## 2016-02-02 DIAGNOSIS — E785 Hyperlipidemia, unspecified: Secondary | ICD-10-CM | POA: Diagnosis present

## 2016-02-02 DIAGNOSIS — I509 Heart failure, unspecified: Secondary | ICD-10-CM | POA: Diagnosis not present

## 2016-02-02 LAB — PROTIME-INR
INR: 1.5 — ABNORMAL HIGH
Prothrombin Time: 15.2 s — ABNORMAL HIGH (ref 9.0–11.5)

## 2016-02-02 MED ORDER — MOMETASONE FURO-FORMOTEROL FUM 200-5 MCG/ACT IN AERO
2.0000 | INHALATION_SPRAY | Freq: Two times a day (BID) | RESPIRATORY_TRACT | Status: DC
Start: 2016-02-02 — End: 2016-02-08
  Administered 2016-02-02 – 2016-02-08 (×10): 2 via RESPIRATORY_TRACT
  Filled 2016-02-02: qty 8.8

## 2016-02-02 MED ORDER — LORATADINE 10 MG PO TABS
10.0000 mg | ORAL_TABLET | Freq: Every day | ORAL | Status: DC
  Administered 2016-02-02 – 2016-02-08 (×7): 10 mg via ORAL
  Filled 2016-02-02 (×7): qty 1

## 2016-02-02 MED ORDER — GUAIFENESIN ER 600 MG PO TB12
600.0000 mg | ORAL_TABLET | Freq: Two times a day (BID) | ORAL | Status: DC
  Administered 2016-02-02 – 2016-02-08 (×13): 600 mg via ORAL
  Filled 2016-02-02 (×13): qty 1

## 2016-02-02 MED ORDER — SODIUM CHLORIDE 0.9 % IV SOLN
250.0000 mL | INTRAVENOUS | Status: DC | PRN
  Administered 2016-02-04: 250 mL via INTRAVENOUS

## 2016-02-02 MED ORDER — IOPAMIDOL (ISOVUE-370) INJECTION 76%
100.0000 mL | Freq: Once | INTRAVENOUS | Status: AC | PRN
  Administered 2016-02-02: 80 mL via INTRAVENOUS

## 2016-02-02 MED ORDER — TRAMADOL HCL 50 MG PO TABS
50.0000 mg | ORAL_TABLET | Freq: Every day | ORAL | Status: DC
  Administered 2016-02-02 – 2016-02-08 (×6): 50 mg via ORAL
  Filled 2016-02-02 (×6): qty 1

## 2016-02-02 MED ORDER — POTASSIUM CHLORIDE CRYS ER 10 MEQ PO TBCR
20.0000 meq | EXTENDED_RELEASE_TABLET | Freq: Every day | ORAL | Status: DC
  Administered 2016-02-02 – 2016-02-08 (×7): 20 meq via ORAL
  Filled 2016-02-02 (×14): qty 2

## 2016-02-02 MED ORDER — GABAPENTIN 300 MG PO CAPS
900.0000 mg | ORAL_CAPSULE | Freq: Every day | ORAL | Status: DC
  Administered 2016-02-02 – 2016-02-07 (×6): 900 mg via ORAL
  Filled 2016-02-02 (×6): qty 3

## 2016-02-02 MED ORDER — ALBUTEROL SULFATE HFA 108 (90 BASE) MCG/ACT IN AERS: 2.0000 | INHALATION_SPRAY | Freq: Four times a day (QID) | RESPIRATORY_TRACT | Status: DC | PRN

## 2016-02-02 MED ORDER — DABIGATRAN ETEXILATE MESYLATE 150 MG PO CAPS
150.0000 mg | ORAL_CAPSULE | Freq: Two times a day (BID) | ORAL | Status: DC
  Administered 2016-02-02: 150 mg via ORAL
  Filled 2016-02-02: qty 1

## 2016-02-02 MED ORDER — DIPHENHYDRAMINE HCL 25 MG PO TABS
25.0000 mg | ORAL_TABLET | Freq: Every day | ORAL | Status: DC
  Administered 2016-02-02 – 2016-02-06 (×5): 25 mg via ORAL
  Filled 2016-02-02 (×11): qty 1

## 2016-02-02 MED ORDER — IPRATROPIUM BROMIDE 0.02 % IN SOLN: 0.5000 mg | Freq: Four times a day (QID) | RESPIRATORY_TRACT | Status: DC | PRN

## 2016-02-02 MED ORDER — ATORVASTATIN CALCIUM 40 MG PO TABS
40.0000 mg | ORAL_TABLET | Freq: Every day | ORAL | Status: DC
  Administered 2016-02-02 – 2016-02-07 (×6): 40 mg via ORAL
  Filled 2016-02-02 (×6): qty 1

## 2016-02-02 MED ORDER — SODIUM CHLORIDE 0.9% FLUSH
3.0000 mL | Freq: Two times a day (BID) | INTRAVENOUS | Status: DC
  Administered 2016-02-02 – 2016-02-06 (×7): 3 mL via INTRAVENOUS

## 2016-02-02 MED ORDER — SODIUM CHLORIDE 0.9% FLUSH: 3.0000 mL | INTRAVENOUS | Status: DC | PRN

## 2016-02-02 MED ORDER — ACETAMINOPHEN 325 MG PO TABS
650.0000 mg | ORAL_TABLET | ORAL | Status: DC | PRN
  Administered 2016-02-04 – 2016-02-06 (×3): 650 mg via ORAL
  Filled 2016-02-02 (×3): qty 2

## 2016-02-02 MED ORDER — ONDANSETRON HCL 4 MG/2ML IJ SOLN: 4.0000 mg | Freq: Four times a day (QID) | INTRAMUSCULAR | Status: DC | PRN

## 2016-02-02 MED ORDER — FLUTICASONE PROPIONATE 50 MCG/ACT NA SUSP
2.0000 | Freq: Every day | NASAL | Status: DC
  Filled 2016-02-02: qty 16

## 2016-02-02 MED ORDER — AMLODIPINE BESYLATE 5 MG PO TABS
5.0000 mg | ORAL_TABLET | Freq: Every day | ORAL | Status: DC
  Administered 2016-02-02 – 2016-02-08 (×7): 5 mg via ORAL
  Filled 2016-02-02 (×7): qty 1

## 2016-02-02 MED ORDER — ALBUTEROL SULFATE (5 MG/ML) 0.5% IN NEBU: 2.5000 mg | INHALATION_SOLUTION | Freq: Four times a day (QID) | RESPIRATORY_TRACT | Status: DC

## 2016-02-02 MED ORDER — METOPROLOL TARTRATE 50 MG PO TABS
50.0000 mg | ORAL_TABLET | Freq: Two times a day (BID) | ORAL | Status: DC
  Administered 2016-02-02 – 2016-02-08 (×13): 50 mg via ORAL
  Filled 2016-02-02 (×13): qty 1

## 2016-02-02 MED ORDER — MELATONIN-PYRIDOXINE ER 3-10 MG PO TBCR
1.0000 | EXTENDED_RELEASE_TABLET | Freq: Every day | ORAL | Status: DC
Start: 2016-02-02 — End: 2016-02-02

## 2016-02-02 MED ORDER — IOPAMIDOL (ISOVUE-370) INJECTION 76%: 100.0000 mL | Freq: Once | INTRAVENOUS | Status: DC | PRN

## 2016-02-02 MED ORDER — LEVOTHYROXINE SODIUM 75 MCG PO TABS
175.0000 ug | ORAL_TABLET | Freq: Every day | ORAL | Status: DC
  Administered 2016-02-03 – 2016-02-08 (×6): 175 ug via ORAL
  Filled 2016-02-02 (×5): qty 1
  Filled 2016-02-02: qty 3

## 2016-02-02 MED ORDER — ROPINIROLE HCL 1 MG PO TABS
1.0000 mg | ORAL_TABLET | Freq: Every day | ORAL | Status: DC
  Administered 2016-02-02 – 2016-02-08 (×7): 1 mg via ORAL
  Filled 2016-02-02 (×7): qty 1

## 2016-02-02 MED ORDER — FUROSEMIDE 10 MG/ML IJ SOLN
40.0000 mg | Freq: Two times a day (BID) | INTRAMUSCULAR | Status: DC
  Administered 2016-02-02 – 2016-02-05 (×7): 40 mg via INTRAVENOUS
  Filled 2016-02-02 (×7): qty 4

## 2016-02-02 MED ORDER — SPIRONOLACTONE 25 MG PO TABS
12.5000 mg | ORAL_TABLET | Freq: Every day | ORAL | Status: DC
  Administered 2016-02-02 – 2016-02-08 (×7): 12.5 mg via ORAL
  Filled 2016-02-02 (×7): qty 1

## 2016-02-02 MED ORDER — BUDESONIDE 0.25 MG/2ML IN SUSP: 0.2500 mg | Freq: Two times a day (BID) | RESPIRATORY_TRACT | Status: DC | PRN

## 2016-02-02 MED ORDER — PANTOPRAZOLE SODIUM 40 MG PO TBEC
40.0000 mg | DELAYED_RELEASE_TABLET | Freq: Every day | ORAL | Status: DC
  Administered 2016-02-03 – 2016-02-08 (×6): 40 mg via ORAL
  Filled 2016-02-02 (×7): qty 1

## 2016-02-02 MED ORDER — ALBUTEROL SULFATE (2.5 MG/3ML) 0.083% IN NEBU: 2.5000 mg | INHALATION_SOLUTION | Freq: Four times a day (QID) | RESPIRATORY_TRACT | Status: DC | PRN

## 2016-02-02 NOTE — Patient Instructions (Addendum)
Medication Instructions:  Your physician recommends that you continue on your current medications as directed. Please refer to the Current Medication list given to you today.   Labwork: TODAY - Pt/INR   Testing/Procedures: Non-Cardiac CT Angiography (CTA), is a special type of CT scan that uses a computer to produce multi-dimensional views of major blood vessels throughout the body. In CT angiography, a contrast material is injected through an IV to help visualize the blood vessels    Follow-Up: You are being admitted to [facility] directly to the [facility] through the [facility]   If you need a refill on your cardiac medications before your next appointment, please call your pharmacy.   Thank you for choosing [facility]! Christen Bame, RN [phone]

## 2016-02-02 NOTE — H&P (Signed)
Cardiology Office Note   Date:  02/02/2016   ID:  [name], DOB 1950-06-24, MRN [id number]  PCP:  Velna Hatchet, MD  Cardiologist:   Mertie Moores, MD   No chief complaint on file.  Problem List: 1. Aortic stenosis - s/p AVR + MAZE procedure   2.   Essential hypertension 3. Chest pain  - Normal cors by cath in 2014 4. Pulmonary Hypertension 5. Atrial fib - s/p Afib ablation ,  S/p pacer  6. Hyperlipidemia-  7. Fibromyalgia - does not sleep well.     History of Present Illness: [name] is a 65 y.o. male who presents for HTN and shortness of breath. I saw him many years ago .  He has been seeing Dr. Einar Gip in the interim.   His main complaint today is that he has severe DOE with any exercise.  Has severe DOE walking 10-20 feet.  Then he has severe nausea with vomitting.    No CP .  Watches his salt.    Has a poor appetite recently  has not lost much weight.  Has fibromyalgia - does not sleep well. Does not have sleep apnea.   Has had several sleep studies.    Has leg and foot swelling - especially at night.  Better in the am .   August 24, 2014:  [name] is doing better.   Still has some dyspnea with exertion.   We started Aldactone during the last visit.   His blood pressure is much better. He's able to walk further and he does not get at her breath quite as easily. He still becomes short of breath if he walks up a hill or walks too fast.  Aug. 29, 2016:  [name] is doing well. Is sleeping better .  Still has episodes of weakness  No syncopal episodes,  Has some dizziness.  No CP or dyspnea.   Did not go for the sleep study because he is sleeping   August 03, 2015:  [name] is seen back today for follow up of his pulmonary HTN and HTN He has been feeling a bit weak and dizzy.  BP is borderline low today   Recent echo shows normal LV function with severe AS He has minimal MR   Sep 18, 2015:  He had AVR on April 25. Not doing As well as he would like.  He is not sleeping well at all. He's had some acute worsening of his renal function. We've cut back his Lasix and we have held his valsartan.  Blood pressure still is a little bit low.  Breathing is better.   Using breathing treatments.   Was readmitted to the hospital with pleural effusion  - had a thoracentesis on May 8.     We cut his lasix to 40 mg a day  - still is having a decent urine output.   November 02, 2015:  Doing better. BP is stable. O2 sats = 98%  Still gets very short of breath with any exertion . Fatigues easily   Has had difficulty getting going in cardiac rehab. Has had severe fatigue   His echo performed after his AVR shows normal LV function, normal prosthetic AV function Improved pulmonary pressures ( from 50 mmhg to 38 mmHg)   Aug. 18, 2017:  [name] is seen today for follow up of his dyspnea.  He's had a difficult time getting over his aortic valve replacement. He was cardioverted since our last visit Felt immediately  better after the cardioversion   He was cardioverted on July 27. He developed hypoxemia after the procedure and was admitted overnight. He's feeling quite a bit better at this time.  Oct. 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 His pacer was reprogrammed and he felt better for a couple of days. He then started having severe shortness of breath once again.  To summarize his past year, [name] had severe aortic stenosis and had severe shortness of breath. He had his aortic valve replaced 08/22/15  and made slow and steady progress and was making definite definite improvements. At some point about 2 or 3 months ago he started having progressive shortness of breath and severe fatigue. He is he's now so short of breath that he gets fatigued and out of breath walking as little as 10 feet.  His echocardiogram performed several months after his aortic valve replacement revealed improvement  of his pulmonary artery pressures. This morning's echocardiogram reveals severe pulmonary hypertension.   There is grade 3 diastolic dysfunction.   He has hx of PAF and has been on Pradaxa - doubt this is a PE .   I'm concerned about constrictive pericarditis.  He has significant PND and orthopnea. He's been sleeping in a recliner. He has significant leg edema.   Past Medical History:  Diagnosis Date  . Aortic stenosis, mild   . Arthritis of hand    "just a little bit in both hands" (03/31/2012)  . Asthma    "little bit" (03/31/2012)  . Atrial fibrillation [facility])    dx '04; DCCV '04, placed on flecainide, failed DCCV 04/2010, flecainide stopped; s/p successful A.fib ablation 01/31/12  . CHF (congestive heart failure) ([place])   . Diabetes mellitus without complication (HCC)    borderline  . DJD (degenerative joint disease)   . Exertional dyspnea   . Fibromyalgia   . First degree AV block    post ablation, ~445ms  . GERD (gastroesophageal reflux disease)   . Heart murmur   . Hyperlipidemia   . Hypertension   . Hypothyroidism    S/P radiation  . Left atrial enlargement    LA size 45mm by echo 11/21/11  . Mitral regurgitation    trivial  . Obesity   . Obstructive sleep apnea    mild by sleep study 2013; pt stated he does not have a machine because "it wasn't bad enough for him to have one"  . Pacemaker 03/31/2012  . Restless leg syndrome   . Second degree AV block     Past Surgical History:  Procedure Laterality Date  . AORTIC VALVE REPLACEMENT N/A 08/22/2015   Procedure: AORTIC VALVE REPLACEMENT (AVR);  Surgeon: Ivin Poot, MD;  Location: [place];  Service: Open Heart Surgery;  Laterality: N/A;  . ATRIAL FIBRILLATION ABLATION  01/30/2012   PVI by Dr. Rayann Heman  . ATRIAL FIBRILLATION ABLATION N/A 01/31/2012   Procedure: ATRIAL FIBRILLATION ABLATION;  Surgeon: Thompson Grayer, MD;  Location: [facility] CATH LAB;  Service: Cardiovascular;  Laterality: N/A;  . BACK SURGERY     X 3  . CARDIAC  CATHETERIZATION N/A 08/09/2015   Procedure: Right/Left Heart Cath and Coronary Angiography;  Surgeon: Peter M Martinique, MD;  Location: [place] CV LAB;  Service: Cardiovascular;  Laterality: N/A;  . CARDIOVASCULAR STRESS TEST  03/12/2002   EF 48%, NO EVIDENCE OF ISCHEMIA  . CARDIOVERSION  01/2012; 03/31/2012  . CARDIOVERSION N/A 02/25/2012  Procedure: CARDIOVERSION;  Surgeon: Thompson Grayer, MD;  Location: [facility] CATH LAB;  Service: Cardiovascular;  Laterality: N/A;  . CARDIOVERSION N/A 03/31/2012   Procedure: CARDIOVERSION;  Surgeon: Thompson Grayer, MD;  Location: [facility] CATH LAB;  Service: Cardiovascular;  Laterality: N/A;  . CARDIOVERSION N/A 11/23/2015   Procedure: CARDIOVERSION;  Surgeon: Larey Dresser, MD;  Location: [place];  Service: Cardiovascular;  Laterality: N/A;  . CLIPPING OF ATRIAL APPENDAGE N/A 08/22/2015   Procedure: CLIPPING OF ATRIAL APPENDAGE;  Surgeon: Ivin Poot, MD;  Location: [place];  Service: Open Heart Surgery;  Laterality: N/A;  . DOPPLER ECHOCARDIOGRAPHY  03/11/2002   EF 70-75%  . FINGER SURGERY Left    Middle finger  . FINGER TENDON REPAIR  1980's   "right little finger" (03/31/2012)  . INSERT / REPLACE / REMOVE PACEMAKER     [employer]  . LEFT AND RIGHT HEART CATHETERIZATION WITH CORONARY ANGIOGRAM N/A 10/27/2012   Procedure: LEFT AND RIGHT HEART CATHETERIZATION WITH CORONARY ANGIOGRAM;  Surgeon: Laverda Page, MD;  Location: [facility] CATH LAB;  Service: Cardiovascular;  Laterality: N/A;  . LUMBAR DISC SURGERY  1980's X2;  2000's  . MAZE N/A 08/22/2015   Procedure: MAZE;  Surgeon: Ivin Poot, MD;  Location: [place];  Service: Open Heart Surgery;  Laterality: N/A;  . PACEMAKER INSERTION  03/31/2012   STJ Accent DR pacemaker implanted by Dr Rayann Heman  . PERMANENT PACEMAKER INSERTION N/A 03/31/2012   Procedure: PERMANENT PACEMAKER INSERTION;  Surgeon: Thompson Grayer, MD;  Location: [facility] CATH LAB;  Service: Cardiovascular;  Laterality: N/A;  . TEE WITHOUT CARDIOVERSION  01/30/2012    Procedure: TRANSESOPHAGEAL ECHOCARDIOGRAM (TEE);  Surgeon: Larey Dresser, MD;  Location: [place];  Service: Cardiovascular;  Laterality: N/A;  ablation next day  . TEE WITHOUT CARDIOVERSION N/A 08/22/2015   Procedure: TRANSESOPHAGEAL ECHOCARDIOGRAM (TEE);  Surgeon: Ivin Poot, MD;  Location: [place];  Service: Open Heart Surgery;  Laterality: N/A;  . US ECHOCARDIOGRAPHY  08/07/2009   EF 55-60%     Current Outpatient Prescriptions  Medication Sig Dispense Refill  . albuterol (PROVENTIL HFA;VENTOLIN HFA) 108 (90 Base) MCG/ACT inhaler Inhale 2 puffs into the lungs every 6 (six) hours as needed for wheezing or shortness of breath. 1 Inhaler 5  . amLODipine (NORVASC) 10 MG tablet Take 0.5 tablets (5 mg total) by mouth daily. 30 tablet 11  . atorvastatin (LIPITOR) 40 MG tablet TAKE 1 TABLET(40 MG) BY MOUTH DAILY 90 tablet 0  . budesonide (PULMICORT) 0.25 MG/2ML nebulizer solution Take 2 mLs (0.25 mg total) by nebulization 2 (two) times daily. (Patient taking differently: Take 0.25 mg by nebulization 2 (two) times daily as needed (for SOB). ) 360 mL 1  . budesonide-formoterol (SYMBICORT) 160-4.5 MCG/ACT inhaler Inhale 2 puffs into the lungs 2 (two) times daily. 1 Inhaler 6  . Cholecalciferol (VITAMIN D) 2000 UNITS CAPS Take 2,000 Units by mouth daily.    . dabigatran (PRADAXA) 150 MG CAPS capsule Take 1 capsule (150 mg total) by mouth 2 (two) times daily. 60 capsule 1  . diphenhydrAMINE (BENADRYL) 25 MG tablet Take 25 mg by mouth at bedtime.    . fexofenadine (ALLEGRA) 180 MG tablet Take 1 tablet (180 mg total) by mouth daily. 30 tablet 1  . fluticasone (FLONASE) 50 MCG/ACT nasal spray Place into both nostrils 2 (two) times daily as needed for allergies or rhinitis.    . furosemide (LASIX) 40 MG tablet Take 1 tablet (40 mg total) by mouth daily. (Patient  taking differently: Take 40 mg by mouth 2 (two) times daily. ) 120 tablet 3  . gabapentin (NEURONTIN) 300 MG capsule Take 900 mg by mouth  at bedtime.   3  . guaiFENesin (MUCINEX) 600 MG 12 hr tablet Take 600 mg by mouth 2 (two) times daily.    [name] ipratropium (ATROVENT) 0.02 % nebulizer solution Take 2.5 mLs (0.5 mg total) by nebulization 4 (four) times daily. 300 mL 1  . levalbuterol (XOPENEX) 0.63 MG/3ML nebulizer solution Take 3 mLs (0.63 mg total) by nebulization every 6 (six) hours as needed for wheezing or shortness of breath. (Patient taking differently: Take 0.63 mg by nebulization 2 (two) times daily as needed for wheezing or shortness of breath. ) 360 mL 1  . levothyroxine (SYNTHROID, LEVOTHROID) 175 MCG tablet Take 175 mcg by mouth daily before breakfast.    . Melatonin-Pyridoxine (MELATONEX) 3-10 MG TBCR Take 1 tablet by mouth at bedtime.    . metFORMIN (GLUMETZA) 500 MG (MOD) 24 hr tablet Take 500 mg by mouth daily with breakfast.    . metoprolol (LOPRESSOR) 50 MG tablet Take 1 tablet (50 mg total) by mouth 2 (two) times daily. 60 tablet 11  . pantoprazole (PROTONIX) 40 MG tablet Take 40 mg by mouth daily.    . potassium chloride (K-DUR) 10 MEQ tablet Take 1 tablet (10 mEq total) by mouth daily. (Patient taking differently: Take 20 mEq by mouth daily. ) 30 tablet 11  . promethazine (PHENERGAN) 25 MG tablet Take 25 mg by mouth every 6 (six) hours as needed for nausea or vomiting.    [name] rOPINIRole (REQUIP) 1 MG tablet Take 1 tablet (1 mg total) by mouth daily. 90 tablet 2  . spironolactone (ALDACTONE) 25 MG tablet Take 0.5 tablets (12.5 mg total) by mouth daily. 45 tablet 3  . traMADol (ULTRAM) 50 MG tablet Take 1 tablet by mouth daily. Reported on 10/17/2015    . valACYclovir (VALTREX) 1000 MG tablet Take 1 tablet by mouth every 12 (twelve) hours as needed (fever blisters). Reported on 10/17/2015     No current facility-administered medications for this visit.     Allergies:   Meloxicam and Vancomycin    Social History:  The patient  reports that he quit smoking about 24 years ago. His smoking use included Cigarettes. He  has a 7.50 pack-year smoking history. He has never used smokeless tobacco. He reports that he drinks alcohol. He reports that he uses drugs, including Marijuana.   Family History:  The patient's family history includes Heart failure in his mother; Stroke in his father.    ROS:  Please see the history of present illness.    Review of Systems: Constitutional:  denies fever, chills, diaphoresis, appetite change and fatigue.  HEENT: denies photophobia, eye pain, redness, hearing loss, ear pain, congestion, sore throat, rhinorrhea, sneezing, neck pain, neck stiffness and tinnitus.  Respiratory: admits to SOB, DOE,    Cardiovascular: admits to   leg swelling.  Gastrointestinal: admits to nausea, vomiting, with walking   Genitourinary: denies dysuria, urgency, frequency, hematuria, flank pain and difficulty urinating.  Musculoskeletal: denies  myalgias, back pain, joint swelling, arthralgias and gait problem.   Skin: denies pallor, rash and wound.  Neurological: denies dizziness, seizures, syncope, weakness, light-headedness, numbness and headaches.   Hematological: denies adenopathy, easy bruising, personal or family bleeding history.  Psychiatric/ Behavioral: denies suicidal ideation, mood changes, confusion, nervousness, sleep disturbance and agitation.       All other systems are reviewed and  negative.    PHYSICAL EXAM: VS:  BP 132/66 (BP Location: Left Arm, Patient Position: Sitting, Cuff Size: Small)   Pulse 75   Ht 5\' 11"  (1.803 m)   Wt 218 lb 9.6 oz (99.2 kg)   BMI 30.49 kg/m  , BMI Body mass index is 30.49 kg/m. GEN:  Slightly yellow appearance  HEENT: normal  Neck: + JVD 10-12 cm , no carotid bruits, or masses Cardiac: RRR;  Possible pericardial knock . he has a 1/6 systolic murmur at the upper left sternal border.  1-2 +  significant edema in both legs  Respiratory:  clear to auscultation bilaterally,  GI: soft, nontender, nondistended, + BS MS: no deformity or atrophy   Skin: warm and dry, no rash Neuro:  Strength and sensation are intact Psych: normal   EKG:  EKG is not ordered today.  tele - clearly in NSR by echo    Recent Labs: 08/23/2015: Magnesium 2.4 09/04/2015: TSH 0.441 01/15/2016: Pro B Natriuretic peptide (BNP) 470.0 01/29/2016: ALT 14; Brain Natriuretic Peptide 406.6; BUN 19; Creat 1.65; Hemoglobin 9.2; Platelets 245; Potassium 3.8; Sodium 140    Lipid Panel    Component Value Date/Time   CHOL 114 (L) 08/03/2015 1036   TRIG 111 08/03/2015 1036   HDL 30 (L) 08/03/2015 1036   CHOLHDL 3.8 08/03/2015 1036   VLDL 22 08/03/2015 1036   LDLCALC 62 08/03/2015 1036      Wt Readings from Last 3 Encounters:  02/02/16 218 lb 9.6 oz (99.2 kg)  01/29/16 214 lb 12.8 oz (97.4 kg)  01/15/16 215 lb (97.5 kg)      Other studies Reviewed: Additional studies/ records that were reviewed today include:  Review of the above records demonstrates:    ASSESSMENT AND PLAN:  1.    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 He has elevated neck veins. He has 1+ leg edema. His color looks a bit yellow. He has significant ascites.  Doubt pulmonary in was because he's been on Pradaxa long-term.  I would recommend that we admit him and diurese him. He'll need a right left heart catheterization with careful attention paid to his hemodynamic tracings. He has a pacemaker so we will not be able to do an MRI. We'll discuss with our [place] colleagues the best way to image his pericardium and help  make this diagnosis.   Rec. IV lasix bid Hold Pradaxa after tonight's dose and use heparin until the cath on Monday  CT angio of lungs - has been done - shows pulmonary edema. No pulmonary embolus   1. Essential hypertension-    BP is minimally elevated. Will  increase metoprolol to 50 BID   2. Aortic stenosis: s/p AVR , bioprosthetic valve. (April 25 ,2017)   he has very mild aortic insufficiency but no significant aortic valve gradient.    3. Shortness of breath:   His breathing seems to improve. His O2 saturations remained stable.  4. Pulmonary Hypertension- his PA pressures are back up   5. Atrial fib - hold pradaxa after tonight for cath on Monday     Had a cardioversion in July, 2017 ,  Feels much better.   6. Hyperlipidemia-  followed by his primary medical doctor  Current medicines are reviewed at length with the patient today.  The patient does  not have concerns regarding medicines.  The following changes have been made:  no change  Labs/ tests ordered today include:   Orders Placed This Encounter  Procedures  . CT ANGIO CHEST PE W OR WO CONTRAST    Will see him in 3 months   , Mertie Moores, MD  02/02/2016 8:20 AM    [facility] [address], [place], [place]  [address] Phone: [phone]; Fax: [phone]

## 2016-02-02 NOTE — Progress Notes (Signed)
Cardiology Office Note   Date:  02/02/2016   ID:  [name], DOB 1951/02/22, MRN [id number]  PCP:  Velna Hatchet, MD  Cardiologist:   Mertie Moores, MD   No chief complaint on file.  Problem List: 1. Aortic stenosis - s/p AVR + MAZE procedure   2.   Essential hypertension 3. Chest pain  - Normal cors by cath in 2014 4. Pulmonary Hypertension 5. Atrial fib - s/p Afib ablation ,  S/p pacer  6. Hyperlipidemia-  7. Fibromyalgia - does not sleep well.     History of Present Illness: [name] is a 65 y.o. male who presents for HTN and shortness of breath. I saw him many years ago .  He has been seeing Dr. Einar Gip in the interim.   His main complaint today is that he has severe DOE with any exercise.  Has severe DOE walking 10-20 feet.  Then he has severe nausea with vomitting.    No CP .  Watches his salt.    Has a poor appetite recently  has not lost much weight.  Has fibromyalgia - does not sleep well. Does not have sleep apnea.   Has had several sleep studies.    Has leg and foot swelling - especially at night.  Better in the am .   August 24, 2014:  [name] is doing better.   Still has some dyspnea with exertion.   We started Aldactone during the last visit.   His blood pressure is much better. He's able to walk further and he does not get at her breath quite as easily. He still becomes short of breath if he walks up a hill or walks too fast.  Aug. 29, 2016:  [name] is doing well. Is sleeping better .  Still has episodes of weakness  No syncopal episodes,  Has some dizziness.  No CP or dyspnea.   Did not go for the sleep study because he is sleeping   August 03, 2015:  [name] is seen back today for follow up of his pulmonary HTN and HTN He has been feeling a bit weak and dizzy.  BP is borderline low today   Recent echo shows normal LV function with severe AS He has minimal MR   Sep 18, 2015:  He had AVR on April 25. Not doing As well as he would like.  He is not sleeping well at all. He's had some acute worsening of his renal function. We've cut back his Lasix and we have held his valsartan.  Blood pressure still is a little bit low.  Breathing is better.   Using breathing treatments.   Was readmitted to the hospital with pleural effusion  - had a thoracentesis on May 8.     We cut his lasix to 40 mg a day  - still is having a decent urine output.   November 02, 2015:  Doing better. BP is stable. O2 sats = 98%  Still gets very short of breath with any exertion . Fatigues easily   Has had difficulty getting going in cardiac rehab. Has had severe fatigue   His echo performed after his AVR shows normal LV function, normal prosthetic AV function Improved pulmonary pressures ( from 50 mmhg to 38 mmHg)   Aug. 18, 2017:  [name] is seen today for follow up of his dyspnea.  He's had a difficult time getting over his aortic valve replacement. He was cardioverted since our last visit Felt  immediately better after the cardioversion   He was cardioverted on July 27. He developed hypoxemia after the procedure and was admitted overnight. He's feeling quite a bit better at this time.  Oct. 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 His pacer was reprogrammed and he felt better for a couple of days. He then started having severe shortness of breath once again.  To summarize his past year, [name] had severe aortic stenosis and had severe shortness of breath. He had his aortic valve replaced 08/22/15  and made slow and steady progress and was making definite definite improvements. At some point about 2 or 3 months ago he started having progressive shortness of breath and severe fatigue. He is he's now so short of breath that he gets fatigued and out of breath walking as little as 10 feet.  His echocardiogram performed several months after his aortic valve replacement revealed improvement  of his pulmonary artery pressures. This morning's echocardiogram reveals severe pulmonary hypertension.   There is grade 3 diastolic dysfunction.   He has hx of PAF and has been on Pradaxa - doubt this is a PE .   I'm concerned about constrictive pericarditis.  He has significant PND and orthopnea. He's been sleeping in a recliner. He has significant leg edema.   Past Medical History:  Diagnosis Date  . Aortic stenosis, mild   . Arthritis of hand    "just a little bit in both hands" (03/31/2012)  . Asthma    "little bit" (03/31/2012)  . Atrial fibrillation [facility])    dx '04; DCCV '04, placed on flecainide, failed DCCV 04/2010, flecainide stopped; s/p successful A.fib ablation 01/31/12  . CHF (congestive heart failure) ([place])   . Diabetes mellitus without complication (HCC)    borderline  . DJD (degenerative joint disease)   . Exertional dyspnea   . Fibromyalgia   . 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 GERD (gastroesophageal reflux disease)   . Heart murmur   . Hyperlipidemia   . Hypertension   . Hypothyroidism    S/P radiation  . Left atrial enlargement    LA size 63mm by echo 11/21/11  . Mitral regurgitation    trivial  . Obesity   . Obstructive sleep apnea    mild by sleep study 2013; pt stated he does not have a machine because "it wasn't bad enough for him to have one"  . Pacemaker 03/31/2012  . Restless leg syndrome   . Second degree AV block     Past Surgical History:  Procedure Laterality Date  . AORTIC VALVE REPLACEMENT N/A 08/22/2015   Procedure: AORTIC VALVE REPLACEMENT (AVR);  Surgeon: Ivin Poot, MD;  Location: [place];  Service: Open Heart Surgery;  Laterality: N/A;  . ATRIAL FIBRILLATION ABLATION  01/30/2012   PVI by Dr. Rayann Heman  . ATRIAL FIBRILLATION ABLATION N/A 01/31/2012   Procedure: ATRIAL FIBRILLATION ABLATION;  Surgeon: Thompson Grayer, MD;  Location: [facility] CATH LAB;  Service: Cardiovascular;  Laterality: N/A;  . BACK SURGERY     X 3  . CARDIAC  CATHETERIZATION N/A 08/09/2015   Procedure: Right/Left Heart Cath and Coronary Angiography;  Surgeon: Peter M Martinique, MD;  Location: [place] CV LAB;  Service: Cardiovascular;  Laterality: N/A;  . CARDIOVASCULAR STRESS TEST  03/12/2002   EF 48%, NO EVIDENCE OF ISCHEMIA  . CARDIOVERSION  01/2012; 03/31/2012  . CARDIOVERSION N/A 02/25/2012  Procedure: CARDIOVERSION;  Surgeon: Thompson Grayer, MD;  Location: [facility] CATH LAB;  Service: Cardiovascular;  Laterality: N/A;  . CARDIOVERSION N/A 03/31/2012   Procedure: CARDIOVERSION;  Surgeon: Thompson Grayer, MD;  Location: [facility] CATH LAB;  Service: Cardiovascular;  Laterality: N/A;  . CARDIOVERSION N/A 11/23/2015   Procedure: CARDIOVERSION;  Surgeon: Larey Dresser, MD;  Location: [place];  Service: Cardiovascular;  Laterality: N/A;  . CLIPPING OF ATRIAL APPENDAGE N/A 08/22/2015   Procedure: CLIPPING OF ATRIAL APPENDAGE;  Surgeon: Ivin Poot, MD;  Location: [place];  Service: Open Heart Surgery;  Laterality: N/A;  . DOPPLER ECHOCARDIOGRAPHY  03/11/2002   EF 70-75%  . FINGER SURGERY Left    Middle finger  . FINGER TENDON REPAIR  1980's   "right little finger" (03/31/2012)  . INSERT / REPLACE / REMOVE PACEMAKER     [employer]  . LEFT AND RIGHT HEART CATHETERIZATION WITH CORONARY ANGIOGRAM N/A 10/27/2012   Procedure: LEFT AND RIGHT HEART CATHETERIZATION WITH CORONARY ANGIOGRAM;  Surgeon: Laverda Page, MD;  Location: [facility] CATH LAB;  Service: Cardiovascular;  Laterality: N/A;  . LUMBAR DISC SURGERY  1980's X2;  2000's  . MAZE N/A 08/22/2015   Procedure: MAZE;  Surgeon: Ivin Poot, MD;  Location: [place];  Service: Open Heart Surgery;  Laterality: N/A;  . PACEMAKER INSERTION  03/31/2012   STJ Accent DR pacemaker implanted by Dr Rayann Heman  . PERMANENT PACEMAKER INSERTION N/A 03/31/2012   Procedure: PERMANENT PACEMAKER INSERTION;  Surgeon: Thompson Grayer, MD;  Location: [facility] CATH LAB;  Service: Cardiovascular;  Laterality: N/A;  . TEE WITHOUT CARDIOVERSION  01/30/2012    Procedure: TRANSESOPHAGEAL ECHOCARDIOGRAM (TEE);  Surgeon: Larey Dresser, MD;  Location: [place];  Service: Cardiovascular;  Laterality: N/A;  ablation next day  . TEE WITHOUT CARDIOVERSION N/A 08/22/2015   Procedure: TRANSESOPHAGEAL ECHOCARDIOGRAM (TEE);  Surgeon: Ivin Poot, MD;  Location: [place];  Service: Open Heart Surgery;  Laterality: N/A;  . US ECHOCARDIOGRAPHY  08/07/2009   EF 55-60%     Current Outpatient Prescriptions  Medication Sig Dispense Refill  . albuterol (PROVENTIL HFA;VENTOLIN HFA) 108 (90 Base) MCG/ACT inhaler Inhale 2 puffs into the lungs every 6 (six) hours as needed for wheezing or shortness of breath. 1 Inhaler 5  . amLODipine (NORVASC) 10 MG tablet Take 0.5 tablets (5 mg total) by mouth daily. 30 tablet 11  . atorvastatin (LIPITOR) 40 MG tablet TAKE 1 TABLET(40 MG) BY MOUTH DAILY 90 tablet 0  . budesonide (PULMICORT) 0.25 MG/2ML nebulizer solution Take 2 mLs (0.25 mg total) by nebulization 2 (two) times daily. (Patient taking differently: Take 0.25 mg by nebulization 2 (two) times daily as needed (for SOB). ) 360 mL 1  . budesonide-formoterol (SYMBICORT) 160-4.5 MCG/ACT inhaler Inhale 2 puffs into the lungs 2 (two) times daily. 1 Inhaler 6  . Cholecalciferol (VITAMIN D) 2000 UNITS CAPS Take 2,000 Units by mouth daily.    . dabigatran (PRADAXA) 150 MG CAPS capsule Take 1 capsule (150 mg total) by mouth 2 (two) times daily. 60 capsule 1  . diphenhydrAMINE (BENADRYL) 25 MG tablet Take 25 mg by mouth at bedtime.    . fexofenadine (ALLEGRA) 180 MG tablet Take 1 tablet (180 mg total) by mouth daily. 30 tablet 1  . fluticasone (FLONASE) 50 MCG/ACT nasal spray Place into both nostrils 2 (two) times daily as needed for allergies or rhinitis.    . furosemide (LASIX) 40 MG tablet Take 1 tablet (40 mg total) by mouth daily. (Patient  taking differently: Take 40 mg by mouth 2 (two) times daily. ) 120 tablet 3  . gabapentin (NEURONTIN) 300 MG capsule Take 900 mg by mouth  at bedtime.   3  . guaiFENesin (MUCINEX) 600 MG 12 hr tablet Take 600 mg by mouth 2 (two) times daily.    [name] ipratropium (ATROVENT) 0.02 % nebulizer solution Take 2.5 mLs (0.5 mg total) by nebulization 4 (four) times daily. 300 mL 1  . levalbuterol (XOPENEX) 0.63 MG/3ML nebulizer solution Take 3 mLs (0.63 mg total) by nebulization every 6 (six) hours as needed for wheezing or shortness of breath. (Patient taking differently: Take 0.63 mg by nebulization 2 (two) times daily as needed for wheezing or shortness of breath. ) 360 mL 1  . levothyroxine (SYNTHROID, LEVOTHROID) 175 MCG tablet Take 175 mcg by mouth daily before breakfast.    . Melatonin-Pyridoxine (MELATONEX) 3-10 MG TBCR Take 1 tablet by mouth at bedtime.    . metFORMIN (GLUMETZA) 500 MG (MOD) 24 hr tablet Take 500 mg by mouth daily with breakfast.    . metoprolol (LOPRESSOR) 50 MG tablet Take 1 tablet (50 mg total) by mouth 2 (two) times daily. 60 tablet 11  . pantoprazole (PROTONIX) 40 MG tablet Take 40 mg by mouth daily.    . potassium chloride (K-DUR) 10 MEQ tablet Take 1 tablet (10 mEq total) by mouth daily. (Patient taking differently: Take 20 mEq by mouth daily. ) 30 tablet 11  . promethazine (PHENERGAN) 25 MG tablet Take 25 mg by mouth every 6 (six) hours as needed for nausea or vomiting.    [name] rOPINIRole (REQUIP) 1 MG tablet Take 1 tablet (1 mg total) by mouth daily. 90 tablet 2  . spironolactone (ALDACTONE) 25 MG tablet Take 0.5 tablets (12.5 mg total) by mouth daily. 45 tablet 3  . traMADol (ULTRAM) 50 MG tablet Take 1 tablet by mouth daily. Reported on 10/17/2015    . valACYclovir (VALTREX) 1000 MG tablet Take 1 tablet by mouth every 12 (twelve) hours as needed (fever blisters). Reported on 10/17/2015     No current facility-administered medications for this visit.     Allergies:   Meloxicam and Vancomycin    Social History:  The patient  reports that he quit smoking about 24 years ago. His smoking use included Cigarettes. He  has a 7.50 pack-year smoking history. He has never used smokeless tobacco. He reports that he drinks alcohol. He reports that he uses drugs, including Marijuana.   Family History:  The patient's family history includes Heart failure in his mother; Stroke in his father.    ROS:  Please see the history of present illness.    Review of Systems: Constitutional:  denies fever, chills, diaphoresis, appetite change and fatigue.  HEENT: denies photophobia, eye pain, redness, hearing loss, ear pain, congestion, sore throat, rhinorrhea, sneezing, neck pain, neck stiffness and tinnitus.  Respiratory: admits to SOB, DOE,    Cardiovascular: admits to   leg swelling.  Gastrointestinal: admits to nausea, vomiting, with walking   Genitourinary: denies dysuria, urgency, frequency, hematuria, flank pain and difficulty urinating.  Musculoskeletal: denies  myalgias, back pain, joint swelling, arthralgias and gait problem.   Skin: denies pallor, rash and wound.  Neurological: denies dizziness, seizures, syncope, weakness, light-headedness, numbness and headaches.   Hematological: denies adenopathy, easy bruising, personal or family bleeding history.  Psychiatric/ Behavioral: denies suicidal ideation, mood changes, confusion, nervousness, sleep disturbance and agitation.       All other systems are reviewed and  negative.    PHYSICAL EXAM: VS:  BP 132/66 (BP Location: Left Arm, Patient Position: Sitting, Cuff Size: Small)   Pulse 75   Ht 5' 11" (1.803 m)   Wt 218 lb 9.6 oz (99.2 kg)   BMI 30.49 kg/m  , BMI Body mass index is 30.49 kg/m. GEN:  Slightly yellow appearance  HEENT: normal  Neck: + JVD 10-12 cm , no carotid bruits, or masses Cardiac: RRR;  Possible pericardial knock . he has a 1/6 systolic murmur at the upper left sternal border.  1-2 +  significant edema in both legs  Respiratory:  clear to auscultation bilaterally,  GI: soft, nontender, nondistended, + BS MS: no deformity or atrophy   Skin: warm and dry, no rash Neuro:  Strength and sensation are intact Psych: normal   EKG:  EKG is not ordered today.  tele - clearly in NSR by echo    Recent Labs: 08/23/2015: Magnesium 2.4 09/04/2015: TSH 0.441 01/15/2016: Pro B Natriuretic peptide (BNP) 470.0 01/29/2016: ALT 14; Brain Natriuretic Peptide 406.6; BUN 19; Creat 1.65; Hemoglobin 9.2; Platelets 245; Potassium 3.8; Sodium 140    Lipid Panel    Component Value Date/Time   CHOL 114 (L) 08/03/2015 1036   TRIG 111 08/03/2015 1036   HDL 30 (L) 08/03/2015 1036   CHOLHDL 3.8 08/03/2015 1036   VLDL 22 08/03/2015 1036   LDLCALC 62 08/03/2015 1036      Wt Readings from Last 3 Encounters:  02/02/16 218 lb 9.6 oz (99.2 kg)  01/29/16 214 lb 12.8 oz (97.4 kg)  01/15/16 215 lb (97.5 kg)      Other studies Reviewed: Additional studies/ records that were reviewed today include:  Review of the above records demonstrates:    ASSESSMENT AND PLAN:  1.    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 He has elevated neck veins. He has 1+ leg edema. His color looks a bit yellow. He has significant ascites.  Doubt pulmonary in was because he's been on Pradaxa long-term.  I would recommend that we admit him and diurese him. He'll need a right left heart catheterization with careful attention paid to his hemodynamic tracings. He has a pacemaker so we will not be able to do an MRI. We'll discuss with our maging colleagues the best way to image his pericardium and help  make this diagnosis.   Rec. IV lasix bid Hold Pradaxa after tonight's dose and use heparin until the cath on Monday  CT angio of lungs - has been done - shows pulmonary edema. No pulmonary embolus   1. Essential hypertension-    BP is minimally elevated. Will  increase metoprolol to 50 BID   2. Aortic stenosis: s/p AVR , bioprosthetic valve. (April 25 ,2017)   he has very mild aortic insufficiency but no significant aortic valve gradient.    3. Shortness of breath:   His breathing seems to improve. His O2 saturations remained stable.  4. Pulmonary Hypertension- his PA pressures are back up   5. Atrial fib - hold pradaxa after tonight for cath on Monday     Had a cardioversion in July, 2017 ,  Feels much better.   6. Hyperlipidemia-  followed by his primary medical doctor  Current medicines are reviewed at length with the patient today.  The patient does   does not have concerns regarding medicines.  The following changes have been made:  no change  Labs/ tests ordered today include:   Orders Placed This Encounter  Procedures  . CT ANGIO CHEST PE W OR WO CONTRAST    Will see him in 3 months   , Mertie Moores, MD  02/02/2016 8:20 AM    [facility] [address], [place],   [address] Phone: [phone]; Fax: [phone]

## 2016-02-03 DIAGNOSIS — I5030 Unspecified diastolic (congestive) heart failure: Secondary | ICD-10-CM | POA: Diagnosis present

## 2016-02-03 DIAGNOSIS — I5032 Chronic diastolic (congestive) heart failure: Secondary | ICD-10-CM | POA: Diagnosis present

## 2016-02-03 LAB — CBC WITH DIFFERENTIAL/PLATELET
Basophils Absolute: 0 10*3/uL (ref 0.0–0.1)
Basophils Relative: 0 %
Eosinophils Absolute: 0.2 10*3/uL (ref 0.0–0.7)
Eosinophils Relative: 2 %
HCT: 31.4 % — ABNORMAL LOW (ref 39.0–52.0)
Hemoglobin: 8.8 g/dL — ABNORMAL LOW (ref 13.0–17.0)
Lymphocytes Relative: 12 %
Lymphs Abs: 1.1 10*3/uL (ref 0.7–4.0)
MCH: 23.8 pg — ABNORMAL LOW (ref 26.0–34.0)
MCHC: 28 g/dL — ABNORMAL LOW (ref 30.0–36.0)
MCV: 84.9 fL (ref 78.0–100.0)
Monocytes Absolute: 0.8 10*3/uL (ref 0.1–1.0)
Monocytes Relative: 8 %
Neutro Abs: 7.1 10*3/uL (ref 1.7–7.7)
Neutrophils Relative %: 78 %
Platelets: 184 10*3/uL (ref 150–400)
RBC: 3.7 MIL/uL — ABNORMAL LOW (ref 4.22–5.81)
RDW: 18.9 % — ABNORMAL HIGH (ref 11.5–15.5)
WBC: 9.2 10*3/uL (ref 4.0–10.5)

## 2016-02-03 LAB — BASIC METABOLIC PANEL
Anion gap: 10 (ref 5–15)
BUN: 16 mg/dL (ref 6–20)
CO2: 32 mmol/L (ref 22–32)
Calcium: 9.1 mg/dL (ref 8.9–10.3)
Chloride: 95 mmol/L — ABNORMAL LOW (ref 101–111)
Creatinine, Ser: 1.58 mg/dL — ABNORMAL HIGH (ref 0.61–1.24)
GFR calc Af Amer: 51 mL/min — ABNORMAL LOW (ref >60)
GFR calc non Af Amer: 44 mL/min — ABNORMAL LOW (ref >60)
Glucose, Bld: 118 mg/dL — ABNORMAL HIGH (ref 65–99)
Potassium: 4.3 mmol/L (ref 3.5–5.1)
Sodium: 137 mmol/L (ref 135–145)

## 2016-02-03 LAB — HEPARIN LEVEL (UNFRACTIONATED): Heparin Unfractionated: 0.22 IU/mL — ABNORMAL LOW (ref 0.30–0.70)

## 2016-02-03 MED ORDER — HEPARIN BOLUS VIA INFUSION
1400.0000 [IU] | Freq: Once | INTRAVENOUS | Status: AC
  Administered 2016-02-03: 1400 [IU] via INTRAVENOUS
  Filled 2016-02-03: qty 1400

## 2016-02-03 MED ORDER — HEPARIN (PORCINE) IN NACL 100-0.45 UNIT/ML-% IJ SOLN
1500.0000 [IU]/h | INTRAMUSCULAR | Status: DC
  Administered 2016-02-03: 1350 [IU]/h via INTRAVENOUS
  Administered 2016-02-04 (×2): 1500 [IU]/h via INTRAVENOUS
  Filled 2016-02-03 (×4): qty 250

## 2016-02-03 NOTE — Progress Notes (Signed)
Pt assisted from straight back chair to recliner chair at HS to elevate LE.  Pt declined to get in the bed.  Pt declined chair alarm and stated he would call to get out of chair if needed.  Pt call bell, cup of ice and urinal within reach.  Will continue to monitor and safety round.

## 2016-02-03 NOTE — Progress Notes (Signed)
ANTICOAGULATION CONSULT NOTE - Initial Consult  Pharmacy Consult for Heprin Indication: atrial fibrillation  Allergies  Allergen Reactions  . Meloxicam Rash  . 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 syndrome 09/02/15, resolved with diphenhydramine and slowing of rate    Patient Measurements: Height: 5\' 11"  (180.3 cm) Weight: 216 lb 4.8 oz (98.1 kg) IBW/kg (Calculated) : 75.3 Heparin Dosing Weight: 96 kg  Vital Signs: Temp: 98.4 F (36.9 C) (10/07 0500) Temp Source: Oral (10/07 0500) BP: 142/72 (10/07 1048) Pulse Rate: 82 (10/07 1050)  Labs:  Recent Labs  02/02/16 0906 02/03/16 0355 02/03/16 1546  HGB  --  8.8*  --   HCT  --  31.4*  --   PLT  --  184  --   LABPROT 15.2*  --   --   INR 1.5*  --   --   HEPARINUNFRC  --   --  0.22*  CREATININE  --  1.58*  --     Estimated Creatinine Clearance: 54.9 mL/min (by C-G formula based on SCr of 1.58 mg/dL (H)).  Assessment: [name] on pradaxa PTA for afib, who was admitted with severe dyspnea. CTA neg for PE, plan to hold pradaxa and start IV heparin, Cath Monday. Last pradaxa dose was 10/6 at ~ 2200. hgb 8.8, pltc wnl, crcl ~ 50 ml/min   Initial Hep Lvl low at 0.22 on 1350 units/hr  Goal of Therapy:  Heparin level 0.3-0.7 units/ml Monitor platelets by anticoagulation protocol: Yes   Plan:  Heparin bolus 1400 units Increase infusion to 1500 units/hr Next lvl 0000 Daily HL, CBC  Levester Fresh, PharmD, BCPS, [facility] Clinical Pharmacist Pager [phone] 02/03/2016 4:47 PM

## 2016-02-03 NOTE — Progress Notes (Signed)
ANTICOAGULATION CONSULT NOTE - Initial Consult  Pharmacy Consult for Heprin Indication: atrial fibrillation  Allergies  Allergen Reactions  . Meloxicam Rash  . 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 syndrome 09/02/15, resolved with diphenhydramine and slowing of rate    Patient Measurements: Height: 5\' 11"  (180.3 cm) Weight: 216 lb 4.8 oz (98.1 kg) IBW/kg (Calculated) : 75.3 Heparin Dosing Weight: 96 kg  Vital Signs: Temp: 98.4 F (36.9 C) (10/07 0500) Temp Source: Oral (10/07 0500) BP: 119/57 (10/07 0500) Pulse Rate: 69 (10/07 0500)  Labs:  Recent Labs  02/02/16 0906 02/03/16 0355  HGB  --  8.8*  HCT  --  31.4*  PLT  --  184  LABPROT 15.2*  --   INR 1.5*  --   CREATININE  --  1.58*    Estimated Creatinine Clearance: 54.9 mL/min (by C-G formula based on SCr of 1.58 mg/dL (H)).   Medical History: Past Medical History:  Diagnosis Date  . Aortic stenosis, mild   . Arthritis of hand    "just a little bit in both hands" (03/31/2012)  . Asthma    "little bit" (03/31/2012)  . Atrial fibrillation [facility])    dx '04; DCCV '04, placed on flecainide, failed DCCV 04/2010, flecainide stopped; s/p successful A.fib ablation 01/31/12  . CHF (congestive heart failure) ([place])   . Diabetes mellitus without complication (HCC)    borderline  . DJD (degenerative joint disease)   . Exertional dyspnea   . Fibromyalgia   . First degree AV block    post ablation, ~413ms  . GERD (gastroesophageal reflux disease)   . Heart murmur   . Hyperlipidemia   . Hypertension   . Hypothyroidism    S/P radiation  . Left atrial enlargement    LA size 65mm by echo 11/21/11  . Mitral regurgitation    trivial  . Obesity   . Obstructive sleep apnea    mild by sleep study 2013; pt stated he does not have a machine because "it wasn't bad enough for him to have one"  . Pacemaker 03/31/2012  . Restless leg syndrome   . Second degree AV block     Assessment: 71 YOM on pradaxa PTA for afib, who  was admitted with severe dyspnea. CTA neg for PE, plan to hold pradaxa and start IV heparin, Cath Monday. Last pradaxa dose was 10/6 at ~ 2200. hgb 8.8, pltc wnl, crcl ~ 50 ml/min   Goal of Therapy:  Heparin level 0.3-0.7 units/ml Monitor platelets by anticoagulation protocol: Yes   Plan:  - start IV heparin 1350 units/hr with no bolus at 1000 - f/u 6 hr heparin level at 1600 - Daily heparin level and CBC  Maryanna Shape, PharmD, BCPS  Clinical Pharmacist  Pager: [phone]   02/03/2016,8:46 AM

## 2016-02-03 NOTE — Progress Notes (Signed)
Subjective:  Still with significant dyspnea on exertion.  No chest pain.  Diuresis overnight with 3 pound weight loss.  Objective:  Vital Signs in the last 24 hours: BP (!) 119/57 (BP Location: Right Arm)   Pulse 69   Temp 98.4 F (36.9 C) (Oral)   Resp (!) 21   Ht 5\' 11"  (1.803 m)   Wt 98.1 kg (216 lb 4.8 oz)   SpO2 98%   BMI 30.17 kg/m   Physical Exam: Pleasant mildly obese male in no acute distress Lungs:  Clear  Cardiac:  Regular rhythm, normal S1 and S2, no S3, 2/6 murmur across the aortic valve Abdomen:  Soft, nontender, no masses, somewhat distended Extremities:  1+ edema present  Intake/Output from previous day: 10/06 0701 - 10/07 0700 In: 120 [P.O.:120] Out: 1800 [Urine:1800] Weight Filed Weights   02/02/16 1000 02/03/16 0500  Weight: 99.3 kg (219 lb) 98.1 kg (216 lb 4.8 oz)    Lab Results: Basic Metabolic Panel:  Recent Labs  02/03/16 0355  NA 137  K 4.3  CL 95*  CO2 32  GLUCOSE 118*  BUN 16  CREATININE 1.58*    CBC:  Recent Labs  02/03/16 0355  WBC 9.2  NEUTROABS 7.1  HGB 8.8*  HCT 31.4*  MCV 84.9  PLT 184    BNP    Component Value Date/Time   BNP 406.6 (H) 01/29/2016 1543   Telemetry: Sinus with paced left ventricular rhythm  Assessment/Plan:  1.  Severe dyspnea-the possibilities would include constrictive pericarditis versus diastolic heart failure.  He also is significantly anemic that could be contributing.  Recent aortic valve replacement and also possibility of early prosthetic valve dysfunction could be contributing 2.  Severe pulmonary hypertension 3.  History of atrial fibrillation 4.  Functioning pacemaker 5.  Hypertensive heart disease  Recommendations:  Continue intravenous diuresis.  May need to get hematology to look at anemia.  He had a workup with labs in April.  Plan is to switch him to heparin and have a right heart catheterization on Monday.  Kerry Hough  MD [facility] Cardiology  02/03/2016, 8:16  AM

## 2016-02-03 NOTE — Progress Notes (Signed)
Patient complained of severe pain in right hand.  Patient able to move hand.  Patient given heat packs.  Will continue to monitor.

## 2016-02-03 NOTE — Plan of Care (Signed)
Problem: Activity: Goal: Risk for activity intolerance will decrease Outcome: Not Progressing Pt with dyspnea on exertion and unable to maintain O2 sats without oxygen. When ambulating pt gets increasingly tired and SOB with reports of some dizziness. Pt advised to stay rested and to not ambulated unassisted. Pt verbally agrees.   Problem: Fluid Volume: Goal: Ability to maintain a balanced intake and output will improve Outcome: Progressing Pt on strict I&O's. Pt with +1 edema to bilateral lower extremities. IV lasix ordered with plan to diurese pt during hospital stay.

## 2016-02-04 ENCOUNTER — Encounter (HOSPITAL_COMMUNITY): Payer: Self-pay

## 2016-02-04 LAB — CBC
HCT: 31.3 % — ABNORMAL LOW (ref 39.0–52.0)
Hemoglobin: 8.8 g/dL — ABNORMAL LOW (ref 13.0–17.0)
MCH: 23.8 pg — ABNORMAL LOW (ref 26.0–34.0)
MCHC: 28.1 g/dL — ABNORMAL LOW (ref 30.0–36.0)
MCV: 84.6 fL (ref 78.0–100.0)
Platelets: 189 10*3/uL (ref 150–400)
RBC: 3.7 MIL/uL — ABNORMAL LOW (ref 4.22–5.81)
RDW: 18.9 % — ABNORMAL HIGH (ref 11.5–15.5)
WBC: 7.8 10*3/uL (ref 4.0–10.5)

## 2016-02-04 LAB — BASIC METABOLIC PANEL
Anion gap: 7 (ref 5–15)
BUN: 15 mg/dL (ref 6–20)
CO2: 31 mmol/L (ref 22–32)
Calcium: 9 mg/dL (ref 8.9–10.3)
Chloride: 97 mmol/L — ABNORMAL LOW (ref 101–111)
Creatinine, Ser: 1.52 mg/dL — ABNORMAL HIGH (ref 0.61–1.24)
GFR calc Af Amer: 53 mL/min — ABNORMAL LOW (ref >60)
GFR calc non Af Amer: 46 mL/min — ABNORMAL LOW (ref >60)
Glucose, Bld: 202 mg/dL — ABNORMAL HIGH (ref 65–99)
Potassium: 3.9 mmol/L (ref 3.5–5.1)
Sodium: 135 mmol/L (ref 135–145)

## 2016-02-04 LAB — HEPARIN LEVEL (UNFRACTIONATED)
Heparin Unfractionated: 0.45 IU/mL (ref 0.30–0.70)
Heparin Unfractionated: 0.56 IU/mL (ref 0.30–0.70)

## 2016-02-04 MED ORDER — ASPIRIN 81 MG PO CHEW
81.0000 mg | CHEWABLE_TABLET | ORAL | Status: AC
  Administered 2016-02-05: 81 mg via ORAL
  Filled 2016-02-04: qty 1

## 2016-02-04 NOTE — Progress Notes (Signed)
ANTICOAGULATION CONSULT NOTE - Initial Consult  Pharmacy Consult for Heprin Indication: atrial fibrillation  Allergies  Allergen Reactions  . Meloxicam Rash  . 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 syndrome 09/02/15, resolved with diphenhydramine and slowing of rate    Patient Measurements: Height: 5\' 11"  (180.3 cm) Weight: 211 lb 8 oz (95.9 kg) IBW/kg (Calculated) : 75.3 Heparin Dosing Weight: 96 kg  Vital Signs: Temp: 98.2 F (36.8 C) (10/08 0420) Temp Source: Oral (10/08 0420) BP: 135/63 (10/07 2126) Pulse Rate: 84 (10/07 2126)  Labs:  Recent Labs  02/02/16 0906 02/03/16 0355 02/03/16 1546 02/04/16 0012 02/04/16 0836  HGB  --  8.8*  --   --  8.8*  HCT  --  31.4*  --   --  31.3*  PLT  --  184  --   --  189  LABPROT 15.2*  --   --   --   --   INR 1.5*  --   --   --   --   HEPARINUNFRC  --   --  0.22* 0.45 0.56  CREATININE  --  1.58*  --   --   --     Estimated Creatinine Clearance: 54.3 mL/min (by C-G formula based on SCr of 1.58 mg/dL (H)).  Assessment: 41 YOM on pradaxa PTA for afib, who was admitted with severe dyspnea. CTA neg for PE, plan to hold pradaxa and start IV heparin, Cath Monday.   Heparin level 0.56, therapeutic on 1500 units/hr  Goal of Therapy:  Heparin level 0.3-0.7 units/ml Monitor platelets by anticoagulation protocol: Yes   Plan:  Continue Heparin infusion 1500 units/hr Daily HL, CBC  Maryanna Shape, PharmD, BCPS  Clinical Pharmacist  Pager: [phone]   02/04/2016 9:18 AM

## 2016-02-04 NOTE — Progress Notes (Signed)
ANTICOAGULATION CONSULT NOTE - Follow Up Consult  Pharmacy Consult for heparin Indication: atrial fibrillation  Labs:  Recent Labs  02/02/16 0906 02/03/16 0355 02/03/16 1546 02/04/16 0012  HGB  --  8.8*  --   --   HCT  --  31.4*  --   --   PLT  --  184  --   --   LABPROT 15.2*  --   --   --   INR 1.5*  --   --   --   HEPARINUNFRC  --   --  0.22* 0.45  CREATININE  --  1.58*  --   --      AssessmentPlan:  65yo male therapeutic on heparin after rate change. Will continue gtt at current rate and confirm stable with additional level.   Wynona Neat, PharmD, BCPS  02/04/2016,1:21 AM

## 2016-02-04 NOTE — Progress Notes (Signed)
Subjective:  Continues to be short of breath with activity but none at rest.  Significant diuresis with 8 pound weight loss since admission.  Still with some edema however.  No chest pain.  Remains significantly anemic also.  Objective:  Vital Signs in the last 24 hours: BP 135/63 (BP Location: Right Arm)   Pulse 84   Temp 98.2 F (36.8 C) (Oral)   Resp (!) 24   Ht 5\' 11"  (1.803 m)   Wt 95.9 kg (211 lb 8 oz)   SpO2 100%   BMI 29.50 kg/m   Physical Exam: Pleasant mildly obese male in no acute distress Lungs:  Clear  Cardiac:  Regular rhythm, normal S1 and S2, no S3, 2/6 murmur across the aortic valve Abdomen:  Soft, nontender, no masses, somewhat distended Extremities:  1+ edema present  Intake/Output from previous day: 10/07 0701 - 10/08 0700 In: [id number] [P.O.:1020; I.V.:165] Out: 4950 [Urine:4950] Weight Filed Weights   02/02/16 1000 02/03/16 0500 02/04/16 0420  Weight: 99.3 kg (219 lb) 98.1 kg (216 lb 4.8 oz) 95.9 kg (211 lb 8 oz)    Lab Results: Basic Metabolic Panel:  Recent Labs  02/03/16 0355 02/04/16 0836  NA 137 135  K 4.3 3.9  CL 95* 97*  CO2 32 31  GLUCOSE 118* 202*  BUN 16 15  CREATININE 1.58* 1.52*    CBC:  Recent Labs  02/03/16 0355 02/04/16 0836  WBC 9.2 7.8  NEUTROABS 7.1  --   HGB 8.8* 8.8*  HCT 31.4* 31.3*  MCV 84.9 84.6  PLT 184 189    BNP    Component Value Date/Time   BNP 406.6 (H) 01/29/2016 1543   Telemetry: Sinus with paced left ventricular rhythm  Assessment/Plan:  1.  Severe dyspnea-the possibilities would include constrictive pericarditis versus diastolic heart failure.  He also is significantly anemic that could be contributing.  Recent aortic valve replacement and also possibility of early prosthetic valve dysfunction could be contributing 2.  Severe pulmonary hypertension 3.  History of atrial fibrillation 4.  Functioning pacemaker 5.  Hypertensive heart disease  Recommendations:  Heart heart cath is planned  tomorrow to assess for possible pericardial issues.  He will also need to have a hematology consult during the week since he had serology studies done to evaluate for anemia earlier this summer.  The anemia could be contributing to his dyspnea.  Risks of right heart cath discussed with patient.  Kerry Hough  MD [facility]  02/04/2016, 10:03 AM

## 2016-02-05 ENCOUNTER — Encounter (HOSPITAL_COMMUNITY): Payer: Medicare Other

## 2016-02-05 ENCOUNTER — Telehealth: Payer: Self-pay | Admitting: Cardiology

## 2016-02-05 ENCOUNTER — Encounter (HOSPITAL_COMMUNITY): Payer: Self-pay | Admitting: Interventional Cardiology

## 2016-02-05 ENCOUNTER — Encounter (HOSPITAL_COMMUNITY)
Admission: AD | Disposition: A | Discharge status: Home / Self Care | Payer: Self-pay | Source: Ambulatory Visit | Attending: Cardiovascular Disease

## 2016-02-05 ENCOUNTER — Ambulatory Visit (HOSPITAL_COMMUNITY): Admit: 2016-02-05 | Payer: Medicare Other | Admitting: Interventional Cardiology

## 2016-02-05 ENCOUNTER — Ambulatory Visit: Payer: Medicare Other | Admitting: Nurse Practitioner

## 2016-02-05 ENCOUNTER — Encounter: Payer: Medicare Other | Admitting: *Deleted

## 2016-02-05 HISTORY — PX: CARDIAC CATHETERIZATION: SHX172

## 2016-02-05 LAB — BASIC METABOLIC PANEL
Anion gap: 9 (ref 5–15)
BUN: 13 mg/dL (ref 6–20)
CO2: 31 mmol/L (ref 22–32)
Calcium: 8.8 mg/dL — ABNORMAL LOW (ref 8.9–10.3)
Chloride: 98 mmol/L — ABNORMAL LOW (ref 101–111)
Creatinine, Ser: 1.5 mg/dL — ABNORMAL HIGH (ref 0.61–1.24)
GFR calc Af Amer: 54 mL/min — ABNORMAL LOW (ref >60)
GFR calc non Af Amer: 47 mL/min — ABNORMAL LOW (ref >60)
Glucose, Bld: 137 mg/dL — ABNORMAL HIGH (ref 65–99)
Potassium: 3.7 mmol/L (ref 3.5–5.1)
Sodium: 138 mmol/L (ref 135–145)

## 2016-02-05 LAB — POCT I-STAT 3, ART BLOOD GAS (G3+)
Acid-Base Excess: 4 mmol/L — ABNORMAL HIGH (ref 0.0–2.0)
Bicarbonate: 29.7 mmol/L — ABNORMAL HIGH (ref 20.0–28.0)
O2 Saturation: 91 %
TCO2: 31 mmol/L (ref 0–100)
pCO2 arterial: 48.4 mmHg — ABNORMAL HIGH (ref 32.0–48.0)
pH, Arterial: 7.396 (ref 7.350–7.450)
pO2, Arterial: 63 mmHg — ABNORMAL LOW (ref 83.0–108.0)

## 2016-02-05 LAB — CBC
HCT: 30.4 % — ABNORMAL LOW (ref 39.0–52.0)
Hemoglobin: 8.5 g/dL — ABNORMAL LOW (ref 13.0–17.0)
MCH: 23.7 pg — ABNORMAL LOW (ref 26.0–34.0)
MCHC: 28 g/dL — ABNORMAL LOW (ref 30.0–36.0)
MCV: 84.7 fL (ref 78.0–100.0)
Platelets: 196 10*3/uL (ref 150–400)
RBC: 3.59 MIL/uL — ABNORMAL LOW (ref 4.22–5.81)
RDW: 18.6 % — ABNORMAL HIGH (ref 11.5–15.5)
WBC: 7.8 10*3/uL (ref 4.0–10.5)

## 2016-02-05 LAB — HEPARIN LEVEL (UNFRACTIONATED): Heparin Unfractionated: 0.59 IU/mL (ref 0.30–0.70)

## 2016-02-05 LAB — POCT I-STAT 3, VENOUS BLOOD GAS (G3P V)
Acid-Base Excess: 6 mmol/L — ABNORMAL HIGH (ref 0.0–2.0)
Bicarbonate: 32.1 mmol/L — ABNORMAL HIGH (ref 20.0–28.0)
O2 Saturation: 51 %
TCO2: 34 mmol/L (ref 0–100)
pCO2, Ven: 56.1 mmHg (ref 44.0–60.0)
pH, Ven: 7.365 (ref 7.250–7.430)
pO2, Ven: 29 mmHg — CL (ref 32.0–45.0)

## 2016-02-05 SURGERY — RIGHT/LEFT HEART CATH AND CORONARY ANGIOGRAPHY

## 2016-02-05 MED ORDER — DABIGATRAN ETEXILATE MESYLATE 150 MG PO CAPS: 150.0000 mg | ORAL_CAPSULE | Freq: Two times a day (BID) | ORAL | Status: DC

## 2016-02-05 MED ORDER — LIDOCAINE HCL (PF) 1 % IJ SOLN
INTRAMUSCULAR | Status: DC | PRN
  Administered 2016-02-05 (×2): 2 mL via SUBCUTANEOUS

## 2016-02-05 MED ORDER — HEPARIN SODIUM (PORCINE) 1000 UNIT/ML IJ SOLN
INTRAMUSCULAR | Status: DC | PRN
  Administered 2016-02-05: 5000 [IU] via INTRAVENOUS

## 2016-02-05 MED ORDER — IOPAMIDOL (ISOVUE-370) INJECTION 76%
INTRAVENOUS | Status: DC | PRN
  Administered 2016-02-05: 25 mL via INTRA_ARTERIAL

## 2016-02-05 MED ORDER — VERAPAMIL HCL 2.5 MG/ML IV SOLN
INTRAVENOUS | Status: AC
  Filled 2016-02-05: qty 2

## 2016-02-05 MED ORDER — FUROSEMIDE 10 MG/ML IJ SOLN
80.0000 mg | Freq: Two times a day (BID) | INTRAMUSCULAR | Status: DC
  Administered 2016-02-05 – 2016-02-08 (×6): 80 mg via INTRAVENOUS
  Filled 2016-02-05 (×6): qty 8

## 2016-02-05 MED ORDER — ACETAMINOPHEN 325 MG PO TABS: 650.0000 mg | ORAL_TABLET | ORAL | Status: DC | PRN

## 2016-02-05 MED ORDER — SODIUM CHLORIDE 0.9% FLUSH
3.0000 mL | Freq: Two times a day (BID) | INTRAVENOUS | Status: DC
  Administered 2016-02-06 – 2016-02-08 (×4): 3 mL via INTRAVENOUS

## 2016-02-05 MED ORDER — LIDOCAINE HCL (PF) 1 % IJ SOLN
INTRAMUSCULAR | Status: AC
  Filled 2016-02-05: qty 30

## 2016-02-05 MED ORDER — FENTANYL CITRATE (PF) 100 MCG/2ML IJ SOLN
INTRAMUSCULAR | Status: AC
  Filled 2016-02-05: qty 2

## 2016-02-05 MED ORDER — HEPARIN SODIUM (PORCINE) 1000 UNIT/ML IJ SOLN
INTRAMUSCULAR | Status: AC
  Filled 2016-02-05: qty 1

## 2016-02-05 MED ORDER — SODIUM CHLORIDE 0.9% FLUSH: 3.0000 mL | INTRAVENOUS | Status: DC | PRN

## 2016-02-05 MED ORDER — FUROSEMIDE 10 MG/ML IJ SOLN
40.0000 mg | Freq: Once | INTRAMUSCULAR | Status: AC
  Administered 2016-02-05: 40 mg via INTRAVENOUS
  Filled 2016-02-05: qty 4

## 2016-02-05 MED ORDER — HEPARIN (PORCINE) IN NACL 2-0.9 UNIT/ML-% IJ SOLN
INTRAMUSCULAR | Status: DC | PRN
  Administered 2016-02-05: 1500 mL

## 2016-02-05 MED ORDER — DABIGATRAN ETEXILATE MESYLATE 150 MG PO CAPS
150.0000 mg | ORAL_CAPSULE | Freq: Two times a day (BID) | ORAL | Status: DC
  Administered 2016-02-05 – 2016-02-08 (×6): 150 mg via ORAL
  Filled 2016-02-05 (×6): qty 1

## 2016-02-05 MED ORDER — SODIUM CHLORIDE 0.9% FLUSH: 3.0000 mL | Freq: Two times a day (BID) | INTRAVENOUS | Status: DC

## 2016-02-05 MED ORDER — MIDAZOLAM HCL 2 MG/2ML IJ SOLN
INTRAMUSCULAR | Status: DC | PRN
  Administered 2016-02-05: 1 mg via INTRAVENOUS

## 2016-02-05 MED ORDER — SODIUM CHLORIDE 0.9 % IV SOLN: 250.0000 mL | INTRAVENOUS | Status: DC | PRN

## 2016-02-05 MED ORDER — ONDANSETRON HCL 4 MG/2ML IJ SOLN: 4.0000 mg | Freq: Four times a day (QID) | INTRAMUSCULAR | Status: DC | PRN

## 2016-02-05 MED ORDER — VERAPAMIL HCL 2.5 MG/ML IV SOLN
INTRAVENOUS | Status: DC | PRN
  Administered 2016-02-05: 10 mL via INTRA_ARTERIAL

## 2016-02-05 MED ORDER — FENTANYL CITRATE (PF) 100 MCG/2ML IJ SOLN
INTRAMUSCULAR | Status: DC | PRN
  Administered 2016-02-05: 25 ug via INTRAVENOUS

## 2016-02-05 MED ORDER — MIDAZOLAM HCL 2 MG/2ML IJ SOLN
INTRAMUSCULAR | Status: AC
  Filled 2016-02-05: qty 2

## 2016-02-05 MED ORDER — IOPAMIDOL (ISOVUE-370) INJECTION 76%
INTRAVENOUS | Status: AC
  Filled 2016-02-05: qty 100

## 2016-02-05 MED ORDER — HEPARIN (PORCINE) IN NACL 2-0.9 UNIT/ML-% IJ SOLN
INTRAMUSCULAR | Status: AC
  Filled 2016-02-05: qty 1500

## 2016-02-05 SURGICAL SUPPLY — 15 items
CATH 5FR JL3.5 JR4 ANG PIG MP (CATHETERS) ×2 IMPLANT
CATH BALLN WEDGE 5F 110CM (CATHETERS) ×2 IMPLANT
CATH EXPO 5F MPA-1 (CATHETERS) ×2 IMPLANT
DEVICE RAD COMP TR BAND LRG (VASCULAR PRODUCTS) ×2 IMPLANT
GLIDESHEATH SLEND SS 6F .021 (SHEATH) ×4 IMPLANT
GUIDEWIRE .025 260CM (WIRE) ×2 IMPLANT
KIT HEART LEFT (KITS) ×2 IMPLANT
PACK CARDIAC CATHETERIZATION (CUSTOM PROCEDURE TRAY) ×2 IMPLANT
PROTECTION STATION PRESSURIZED (MISCELLANEOUS) ×2
SHEATH FAST CATH BRACH 5F 5CM (SHEATH) ×2 IMPLANT
STATION PROTECTION PRESSURIZED (MISCELLANEOUS) ×1 IMPLANT
TRANSDUCER W/STOPCOCK (MISCELLANEOUS) ×4 IMPLANT
TUBING ART PRESS 72  MALE/FEM (TUBING) ×1
TUBING ART PRESS 72 MALE/FEM (TUBING) ×1 IMPLANT
WIRE SAFE-T 1.5MM-J .035X260CM (WIRE) ×2 IMPLANT

## 2016-02-05 NOTE — Progress Notes (Addendum)
DAILY PROGRESS NOTE  Subjective:  Remains dyspneic- L/RHC today shows high left and right heart filling pressures without significant worsening of CAD. He is out 8L negative so far. Cath shows high RA pressure of 30 and wedge of 51 mmHg. LVEDP is 30, therefore TPG is 21. 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  Objective:  Temp:  [97.9 F (36.6 C)-98.2 F (36.8 C)] 97.9 F (36.6 C) (10/09 0621) Pulse Rate:  [69-94] 94 (10/09 1138) Resp:  [11-28] 23 (10/09 0842) BP: (113-159)/(59-83) 124/76 (10/09 1137) SpO2:  [93 %-100 %] 94 % (10/09 0842) Weight:  [215 lb 6.4 oz (97.7 kg)] 215 lb 6.4 oz (97.7 kg) (10/09 0621) Weight change: 3 lb 14.4 oz (1.769 kg)  Intake/Output from previous day: 10/08 0701 - 10/09 0700 In: 1168.2 [P.O.:720; I.V.:448.2] Out: 3010 [Urine:3010]  Intake/Output from this shift: No intake/output data recorded.  Medications: No current facility-administered medications on file prior to encounter.    Current Outpatient Prescriptions on File Prior to Encounter  Medication Sig Dispense Refill  . albuterol (PROVENTIL HFA;VENTOLIN HFA) 108 (90 Base) MCG/ACT inhaler Inhale 2 puffs into the lungs every 6 (six) hours as needed for wheezing or shortness of breath. 1 Inhaler 5  . amLODipine (NORVASC) 10 MG tablet Take 0.5 tablets (5 mg total) by mouth daily. 30 tablet 11  . atorvastatin (LIPITOR) 40 MG tablet TAKE 1 TABLET(40 MG) BY MOUTH DAILY 90 tablet 0  . budesonide (PULMICORT) 0.25 MG/2ML nebulizer solution Take 2 mLs (0.25 mg total) by nebulization 2 (two) times daily. (Patient taking differently: Take 0.25 mg by nebulization 2 (two) times daily as needed (for SOB). ) 360 mL 1  . budesonide-formoterol (SYMBICORT) 160-4.5 MCG/ACT inhaler Inhale 2 puffs into the lungs 2 (two) times daily. 1 Inhaler 6  . Cholecalciferol (VITAMIN D) 2000 UNITS CAPS Take 2,000 Units by mouth daily.    . dabigatran (PRADAXA) 150 MG CAPS capsule Take 1  capsule (150 mg total) by mouth 2 (two) times daily. 60 capsule 1  . diphenhydrAMINE (BENADRYL) 25 MG tablet Take 25 mg by mouth at bedtime.    . fexofenadine (ALLEGRA) 180 MG tablet Take 1 tablet (180 mg total) by mouth daily. 30 tablet 1  . furosemide (LASIX) 40 MG tablet Take 1 tablet (40 mg total) by mouth daily. (Patient taking differently: Take 40 mg by mouth 2 (two) times daily. ) 120 tablet 3  . gabapentin (NEURONTIN) 300 MG capsule Take 900 mg by mouth at bedtime.   3  . guaiFENesin (MUCINEX) 600 MG 12 hr tablet Take 600 mg by mouth 2 (two) times daily.    [name] levalbuterol (XOPENEX) 0.63 MG/3ML nebulizer solution Take 3 mLs (0.63 mg total) by nebulization every 6 (six) hours as needed for wheezing or shortness of breath. (Patient taking differently: Take 0.63 mg by nebulization 2 (two) times daily as needed for wheezing or shortness of breath. ) 360 mL 1  . levothyroxine (SYNTHROID, LEVOTHROID) 175 MCG tablet Take 175 mcg by mouth daily before breakfast.    . Melatonin-Pyridoxine (MELATONEX) 3-10 MG TBCR Take 1 tablet by mouth at bedtime.    . metFORMIN (GLUMETZA) 500 MG (MOD) 24 hr tablet Take 500 mg by mouth daily with breakfast.    . metoprolol (LOPRESSOR) 50 MG tablet Take 1 tablet (50 mg total) by mouth 2 (two) times daily. 60 tablet 11  . pantoprazole (PROTONIX) 40 MG tablet Take 40 mg by mouth daily.    . potassium  chloride (K-DUR) 10 MEQ tablet Take 1 tablet (10 mEq total) by mouth daily. (Patient taking differently: Take 20 mEq by mouth daily. ) 30 tablet 11  . rOPINIRole (REQUIP) 1 MG tablet Take 1 tablet (1 mg total) by mouth daily. 90 tablet 2  . spironolactone (ALDACTONE) 25 MG tablet Take 0.5 tablets (12.5 mg total) by mouth daily. 45 tablet 3  . traMADol (ULTRAM) 50 MG tablet Take 50 mg by mouth daily. Reported on 10/17/2015    . fluticasone (FLONASE) 50 MCG/ACT nasal spray Place into both nostrils 2 (two) times daily as needed for allergies or rhinitis.    [name] ipratropium  (ATROVENT) 0.02 % nebulizer solution Take 2.5 mLs (0.5 mg total) by nebulization 4 (four) times daily. (Patient taking differently: Take 0.5 mg by nebulization 4 (four) times daily as needed for wheezing or shortness of breath. ) 300 mL 1  . promethazine (PHENERGAN) 25 MG tablet Take 25 mg by mouth every 6 (six) hours as needed for nausea or vomiting.    . valACYclovir (VALTREX) 1000 MG tablet Take 1 tablet by mouth every 12 (twelve) hours as needed (fever blisters). Reported on 10/17/2015      Physical Exam: General appearance: alert, mild distress and on nasal cannula Lungs: diminished breath sounds bilaterally Heart: regular rate and rhythm Extremities: extremities normal, atraumatic, no cyanosis or edema Neurologic: Mental status: Alert, oriented, thought content appropriate  Lab Results: Results for orders placed or performed during the hospital encounter of 02/02/16 (from the past 48 hour(s))  Heparin level (unfractionated)     Status: Abnormal   Collection Time: 02/03/16  3:46 PM  Result Value Ref Range   Heparin Unfractionated 0.22 (L) 0.30 - 0.70 IU/mL    Comment:        IF HEPARIN RESULTS ARE BELOW EXPECTED VALUES, AND PATIENT DOSAGE HAS BEEN CONFIRMED, SUGGEST FOLLOW UP TESTING OF ANTITHROMBIN III LEVELS.   Heparin level (unfractionated)     Status: None   Collection Time: 02/04/16 12:12 AM  Result Value Ref Range   Heparin Unfractionated 0.45 0.30 - 0.70 IU/mL    Comment:        IF HEPARIN RESULTS ARE BELOW EXPECTED VALUES, AND PATIENT DOSAGE HAS BEEN CONFIRMED, SUGGEST FOLLOW UP TESTING OF ANTITHROMBIN III LEVELS.   Basic metabolic panel     Status: Abnormal   Collection Time: 02/04/16  8:36 AM  Result Value Ref Range   Sodium 135 135 - 145 mmol/L   Potassium 3.9 3.5 - 5.1 mmol/L   Chloride 97 (L) 101 - 111 mmol/L   CO2 31 22 - 32 mmol/L   Glucose, Bld 202 (H) 65 - 99 mg/dL   BUN 15 6 - 20 mg/dL   Creatinine, Ser 1.52 (H) 0.61 - 1.24 mg/dL   Calcium 9.0 8.9 -  10.3 mg/dL   GFR calc non Af Amer 46 (L) >60 mL/min   GFR calc Af Amer 53 (L) >60 mL/min    Comment: (NOTE) The eGFR has been calculated using the CKD EPI equation. This calculation has not been validated in all clinical situations. eGFR's persistently <60 mL/min signify possible Chronic Kidney Disease.    Anion gap 7 5 - 15  CBC     Status: Abnormal   Collection Time: 02/04/16  8:36 AM  Result Value Ref Range   WBC 7.8 4.0 - 10.5 K/uL   RBC 3.70 (L) 4.22 - 5.81 MIL/uL   Hemoglobin 8.8 (L) 13.0 - 17.0 g/dL   HCT 31.3 (L)  39.0 - 52.0 %   MCV 84.6 78.0 - 100.0 fL   MCH 23.8 (L) 26.0 - 34.0 pg   MCHC 28.1 (L) 30.0 - 36.0 g/dL   RDW 18.9 (H) 11.5 - 15.5 %   Platelets 189 150 - 400 K/uL  Heparin level (unfractionated)     Status: None   Collection Time: 02/04/16  8:36 AM  Result Value Ref Range   Heparin Unfractionated 0.56 0.30 - 0.70 IU/mL    Comment:        IF HEPARIN RESULTS ARE BELOW EXPECTED VALUES, AND PATIENT DOSAGE HAS BEEN CONFIRMED, SUGGEST FOLLOW UP TESTING OF ANTITHROMBIN III LEVELS.   Basic metabolic panel     Status: Abnormal   Collection Time: 02/05/16  3:42 AM  Result Value Ref Range   Sodium 138 135 - 145 mmol/L   Potassium 3.7 3.5 - 5.1 mmol/L   Chloride 98 (L) 101 - 111 mmol/L   CO2 31 22 - 32 mmol/L   Glucose, Bld 137 (H) 65 - 99 mg/dL   BUN 13 6 - 20 mg/dL   Creatinine, Ser 1.50 (H) 0.61 - 1.24 mg/dL   Calcium 8.8 (L) 8.9 - 10.3 mg/dL   GFR calc non Af Amer 47 (L) >60 mL/min   GFR calc Af Amer 54 (L) >60 mL/min    Comment: (NOTE) The eGFR has been calculated using the CKD EPI equation. This calculation has not been validated in all clinical situations. eGFR's persistently <60 mL/min signify possible Chronic Kidney Disease.    Anion gap 9 5 - 15  CBC     Status: Abnormal   Collection Time: 02/05/16  3:42 AM  Result Value Ref Range   WBC 7.8 4.0 - 10.5 K/uL   RBC 3.59 (L) 4.22 - 5.81 MIL/uL   Hemoglobin 8.5 (L) 13.0 - 17.0 g/dL   HCT 30.4 (L)  39.0 - 52.0 %   MCV 84.7 78.0 - 100.0 fL   MCH 23.7 (L) 26.0 - 34.0 pg   MCHC 28.0 (L) 30.0 - 36.0 g/dL   RDW 18.6 (H) 11.5 - 15.5 %   Platelets 196 150 - 400 K/uL  Heparin level (unfractionated)     Status: None   Collection Time: 02/05/16  3:42 AM  Result Value Ref Range   Heparin Unfractionated 0.59 0.30 - 0.70 IU/mL    Comment:        IF HEPARIN RESULTS ARE BELOW EXPECTED VALUES, AND PATIENT DOSAGE HAS BEEN CONFIRMED, SUGGEST FOLLOW UP TESTING OF ANTITHROMBIN III LEVELS.     Imaging: No results found.  Assessment:  1. Principal Problem: 2.   Acute on chronic diastolic CHF (congestive heart failure), NYHA class 4 (HCC) 3. Active Problems: 4.   Atrial fibrillation ([place]) 5.   Pulmonary hypertension 6.   Diastolic heart failure ([place]) 7.   Plan:    1. Discussed cath results with Dr. Haroldine Laws. Will increase lasix to 80 mg IV BID for more diuresis. He will evaluate tomorrow with heart failure service - ?need for pulmonary vasodilator as PA mean is 72 mmHg. Restart pradaxa (held for cath).  Time Spent Directly with Patient:  15 minutes  Length of Stay:  LOS: 3 days   Pixie Casino, MD, [facility] Attending Cardiologist [place] 02/05/2016, 12:04 PM

## 2016-02-05 NOTE — H&P (View-Only) (Signed)
Subjective:  Continues to be short of breath with activity but none at rest.  Significant diuresis with 8 pound weight loss since admission.  Still with some edema however.  No chest pain.  Remains significantly anemic also.  Objective:  Vital Signs in the last 24 hours: BP 135/63 (BP Location: Right Arm)   Pulse 84   Temp 98.2 F (36.8 C) (Oral)   Resp (!) 24   Ht 5\' 11"  (1.803 m)   Wt 95.9 kg (211 lb 8 oz)   SpO2 100%   BMI 29.50 kg/m   Physical Exam: Pleasant mildly obese male in no acute distress Lungs:  Clear  Cardiac:  Regular rhythm, normal S1 and S2, no S3, 2/6 murmur across the aortic valve Abdomen:  Soft, nontender, no masses, somewhat distended Extremities:  1+ edema present  Intake/Output from previous day: 10/07 0701 - 10/08 0700 In: [id number] [P.O.:1020; I.V.:165] Out: 4950 [Urine:4950] Weight Filed Weights   02/02/16 1000 02/03/16 0500 02/04/16 0420  Weight: 99.3 kg (219 lb) 98.1 kg (216 lb 4.8 oz) 95.9 kg (211 lb 8 oz)    Lab Results: Basic Metabolic Panel:  Recent Labs  02/03/16 0355 02/04/16 0836  NA 137 135  K 4.3 3.9  CL 95* 97*  CO2 32 31  GLUCOSE 118* 202*  BUN 16 15  CREATININE 1.58* 1.52*    CBC:  Recent Labs  02/03/16 0355 02/04/16 0836  WBC 9.2 7.8  NEUTROABS 7.1  --   HGB 8.8* 8.8*  HCT 31.4* 31.3*  MCV 84.9 84.6  PLT 184 189    BNP    Component Value Date/Time   BNP 406.6 (H) 01/29/2016 1543   Telemetry: Sinus with paced left ventricular rhythm  Assessment/Plan:  1.  Severe dyspnea-the possibilities would include constrictive pericarditis versus diastolic heart failure.  He also is significantly anemic that could be contributing.  Recent aortic valve replacement and also possibility of early prosthetic valve dysfunction could be contributing 2.  Severe pulmonary hypertension 3.  History of atrial fibrillation 4.  Functioning pacemaker 5.  Hypertensive heart disease  Recommendations:  Heart heart cath is planned  tomorrow to assess for possible pericardial issues.  He will also need to have a hematology consult during the week since he had serology studies done to evaluate for anemia earlier this summer.  The anemia could be contributing to his dyspnea.  Risks of right heart cath discussed with patient.  Kerry Hough  MD [facility]  02/04/2016, 10:03 AM

## 2016-02-05 NOTE — Interval H&P Note (Signed)
Cath Lab Visit (complete for each Cath Lab visit)  Clinical Evaluation Leading to the Procedure:   ACS: Yes.    Non-ACS:    Anginal Classification: CCS IV  Anti-ischemic medical therapy: Minimal Therapy (1 class of medications)  Non-Invasive Test Results: No non-invasive testing performed  Prior CABG: No previous CABG  D/w Dr. Acie Fredrickson.  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  Plan left and right heart cath to evaluate hemodynamics.    History and Physical Interval Note:  02/05/2016 7:48 AM  [name]  has presented today for surgery, with the diagnosis of sob, pulmonary hypertension  The various methods of treatment have been discussed with the patient and family. After consideration of risks, benefits and other options for treatment, the patient has consented to  Procedure(s): Right Heart Cath (N/A) Right/Left Heart Cath and Coronary Angiography (N/A) as a surgical intervention .  The patient's history has been reviewed, patient examined, no change in status, stable for surgery.  I have reviewed the patient's chart and labs.  Questions were answered to the patient's satisfaction.     [name]

## 2016-02-05 NOTE — Telephone Encounter (Signed)
LMOVM reminding pt to send remote transmission.   

## 2016-02-06 ENCOUNTER — Other Ambulatory Visit: Payer: Medicare Other

## 2016-02-06 LAB — BASIC METABOLIC PANEL
Anion gap: 10 (ref 5–15)
BUN: 14 mg/dL (ref 6–20)
CO2: 31 mmol/L (ref 22–32)
Calcium: 9.3 mg/dL (ref 8.9–10.3)
Chloride: 95 mmol/L — ABNORMAL LOW (ref 101–111)
Creatinine, Ser: 1.49 mg/dL — ABNORMAL HIGH (ref 0.61–1.24)
GFR calc Af Amer: 55 mL/min — ABNORMAL LOW (ref >60)
GFR calc non Af Amer: 47 mL/min — ABNORMAL LOW (ref >60)
Glucose, Bld: 110 mg/dL — ABNORMAL HIGH (ref 65–99)
Potassium: 3.9 mmol/L (ref 3.5–5.1)
Sodium: 136 mmol/L (ref 135–145)

## 2016-02-06 LAB — CBC
HCT: 31.9 % — ABNORMAL LOW (ref 39.0–52.0)
Hemoglobin: 9 g/dL — ABNORMAL LOW (ref 13.0–17.0)
MCH: 23.7 pg — ABNORMAL LOW (ref 26.0–34.0)
MCHC: 28.2 g/dL — ABNORMAL LOW (ref 30.0–36.0)
MCV: 84.2 fL (ref 78.0–100.0)
Platelets: 234 10*3/uL (ref 150–400)
RBC: 3.79 MIL/uL — ABNORMAL LOW (ref 4.22–5.81)
RDW: 18.7 % — ABNORMAL HIGH (ref 11.5–15.5)
WBC: 8.7 10*3/uL (ref 4.0–10.5)

## 2016-02-06 NOTE — Care Management Note (Addendum)
Case Management Note  Patient Details  Name: [name] MRN: [id number] Date of Birth: 11/10/1950  Subjective/Objective:  Pt presented for Acute on Chronic CHF. Pt initiated on IV Lasix- Plan will be for return home once stable. Pt has DME 02- wife to bring tank at time of d/c.                Action/Plan: CM did discuss with pt in regards to [facility] and per pt he will not need services. No further needs at this time.   Expected Discharge Date:                  Expected Discharge Plan:  Home/Self Care  In-House Referral:  NA  Discharge planning Services  NA  Post Acute Care Choice:  NA Choice offered to:  NA  DME Arranged:  N/A DME Agency:  NA  HH Arranged:  NA HH Agency:  NA  Status of Service:  Completed, signed off  If discussed at [place] of Stay Meetings, dates discussed:    Additional Comments:  Bethena Roys, RN 02/06/2016, 4:37 PM

## 2016-02-06 NOTE — Progress Notes (Signed)
Advanced Heart Failure Rounding Note  PCP:  Velna Hatchet, MD       Cardiologist:   Mertie Moores, MD   Subjective:    Admitted from office 02/02/16 with severe dyspnea and volume overload. Cath as below with elevated filling pressures and relatively stable CAD.   Feeling better. Still orthopneic. Eating lots of Ice. Had 2 pitchers full over night alone. Estimates he had 6-7 pitchers of ice yesterday + liquid drinks. ( > 3L combined ).  Out 4L yesterday and down 6 lbs.   Echo 02/02/16 LVEF 60-65%, Grade 3 DD. Mild AI, Mild MR, RV mildly dilated and severely reduced. RA mildly dilated. PA peak pressure 74 mm Hg.   L/RHC 02/05/16 - Mid RCA lesion, 10 %stenosed. - Ost Cx to Prox Cx lesion, 20 %stenosed. - LV end diastolic pressure is moderately elevated. - Hemodynamic findings consistent with severe pulmonary hypertension. - No evidence of discordance on the LV/RV tracing. No evidence of pericardial constriction. - Severly elevated PCWP. - PA sat 51%. CO 6.2 L/min. Cardiac index 2.87. - No significant aortic valve gradient. - There is no aortic valve stenosis.  RHC Procedural Findings: Hemodynamics (mmHg) RA mean 30 RV 87/9  (26) PA 100/56 (72) PCWP 51 AO 142/73 (100) Cardiac Output (Fick) 6.21 Cardiac Index (Fick) 2.87  Objective:   Weight Range: 209 lb (94.8 kg) Body mass index is 29.15 kg/m.   Vital Signs:   Temp:  [98.2 F (36.8 C)-98.7 F (37.1 C)] 98.7 F (37.1 C) (10/10 0500) Pulse Rate:  [71-94] 84 (10/10 0810) Resp:  [15-27] 21 (10/10 0810) BP: (101-140)/(59-84) 135/59 (10/10 0810) SpO2:  [0 %-100 %] 98 % (10/10 0810) Weight:  [209 lb (94.8 kg)] 209 lb (94.8 kg) (10/10 0500) Last BM Date: 02/02/16 (normal for him, per pt)  Weight change: Filed Weights   02/04/16 0420 02/05/16 0621 02/06/16 0500  Weight: 211 lb 8 oz (95.9 kg) 215 lb 6.4 oz (97.7 kg) 209 lb (94.8 kg)    Intake/Output:   Intake/Output Summary (Last 24 hours) at 02/06/16 1022 Last  data filed at 02/06/16 0845  Gross per 24 hour  Intake              240 ml  Output             6100 ml  Net            -5860 ml     Physical Exam: General:  Well appearing. No resp difficulty HEENT: normal Neck: supple. JVP to jaw . Carotids 2+ bilat; no bruits. No lymphadenopathy or thyromegaly appreciated. Cor: PMI nondisplaced. Regular rate & rhythm. No rubs, gallops or murmurs. Lungs: CTAB, normal effort Abdomen: soft, NT, ND, no HSM. No bruits or masses. +BS. Umbilical hernia present. Extremities: no cyanosis, clubbing, rash, 1+ edema 1/2 to knee Neuro: alert & orientedx3, cranial nerves grossly intact. moves all 4 extremities w/o difficulty. Affect pleasant   Telemetry: Reviewed, V-paced 90s  Labs: CBC  Recent Labs  02/05/16 0342 02/06/16 0445  WBC 7.8 8.7  HGB 8.5* 9.0*  HCT 30.4* 31.9*  MCV 84.7 84.2  PLT 196 [id number]   Basic Metabolic Panel  Recent Labs  02/05/16 0342 02/06/16 0445  NA 138 136  K 3.7 3.9  CL 98* 95*  CO2 31 31  GLUCOSE 137* 110*  BUN 13 14  CREATININE 1.50* 1.49*  CALCIUM 8.8* 9.3   Liver Function Tests No results for input(s): AST, ALT, ALKPHOS, BILITOT, PROT, ALBUMIN  in the last 72 hours. No results for input(s): LIPASE, AMYLASE in the last 72 hours. Cardiac Enzymes No results for input(s): CKTOTAL, CKMB, CKMBINDEX, TROPONINI in the last 72 hours.  BNP: BNP (last 3 results)  Recent Labs  09/02/15 2011 11/23/15 1432 01/29/16 1543  BNP 206.2* 159.2* 406.6*    ProBNP (last 3 results)  Recent Labs  11/07/15 1155 01/15/16 1708  PROBNP 178.0* 470.0*     D-Dimer No results for input(s): DDIMER in the last 72 hours. Hemoglobin A1C No results for input(s): HGBA1C in the last 72 hours. Fasting Lipid Panel No results for input(s): CHOL, HDL, LDLCALC, TRIG, CHOLHDL, LDLDIRECT in the last 72 hours. Thyroid Function Tests No results for input(s): TSH, T4TOTAL, T3FREE, THYROIDAB in the last 72 hours.  Invalid input(s):  FREET3  Other results:     Imaging/Studies:   No results found.  Latest Echo  Latest Cath   Medications:     Scheduled Medications: . amLODipine  5 mg Oral Daily  . atorvastatin  40 mg Oral q1800  . dabigatran  150 mg Oral Q12H  . diphenhydrAMINE  25 mg Oral QHS  . fluticasone  2 spray Each Nare Daily  . furosemide  80 mg Intravenous BID  . gabapentin  900 mg Oral QHS  . guaiFENesin  600 mg Oral BID  . levothyroxine  175 mcg Oral QAC breakfast  . loratadine  10 mg Oral Daily  . metoprolol  50 mg Oral BID  . mometasone-formoterol  2 puff Inhalation BID  . pantoprazole  40 mg Oral Daily  . potassium chloride  20 mEq Oral Daily  . rOPINIRole  1 mg Oral Daily  . sodium chloride flush  3 mL Intravenous Q12H  . sodium chloride flush  3 mL Intravenous Q12H  . spironolactone  12.5 mg Oral Daily  . traMADol  50 mg Oral Daily     Infusions:     PRN Medications:  sodium chloride, sodium chloride, acetaminophen, albuterol, budesonide, ipratropium, ondansetron (ZOFRAN) IV, sodium chloride flush, sodium chloride flush   Assessment   1. 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 Atrial Fibrillation 3. Pulmonary HTN 4. CAD - mild.  Plan    Remains volume overloaded but diuresing well so far on IV lasix 80 mg BID. Out 12.8 L and down 10 lbs thus far.  Remains orhtopneic and drinking a ton of fluid.  Will fluid restrict.  Continue current lasix for now.  Can use metolazone as needed.   Consult PT.   Echo 02/02/16 LVEF 60-65%, Grade 3 DD. Mild AI, Mild MR, RV mildly dilated and severely reduced. RA mildly dilated. PA peak pressure 74 mm Hg.   Length of Stay: 4   Shirley Friar PA-C 02/06/2016, 10:22 AM  Advanced Heart Failure Team Pager [phone] (M-F; 7a - 4p)  Please contact [facility] for night-coverage after hours (4p -7a ) and weekends on [email or web address]  Patient seen and examined with Oda Kilts, PA-C. We discussed all aspects of the  encounter. I agree with the assessment and plan as stated above.   RHC and echo reviewed personally. RHC shows significant difference between PCWP (50) and LVEDP (30) suggestive of MS but no MS on echo.Does hav signifcant RV dysfunction.  Suspect artifactual. Will continue diuresis. Long talk about need for volume restriction and sliding scale diuretic regimen. Given magnitude of PH will repeat echo or RHC prior to d/c to reassess.   Christasia Angeletti,MD 6:49 PM

## 2016-02-06 NOTE — Care Management Important Message (Signed)
Important Message  Patient Details  Name: [name] MRN: [id number] Date of Birth: July 27, 1950   Medicare Important Message Given:  Yes    [name], [name] 02/06/2016, 9:07 AM

## 2016-02-07 ENCOUNTER — Encounter (HOSPITAL_COMMUNITY): Payer: Medicare Other

## 2016-02-07 DIAGNOSIS — Z952 Presence of prosthetic heart valve: Secondary | ICD-10-CM

## 2016-02-07 DIAGNOSIS — I509 Heart failure, unspecified: Secondary | ICD-10-CM

## 2016-02-07 LAB — BASIC METABOLIC PANEL
Anion gap: 10 (ref 5–15)
BUN: 21 mg/dL — ABNORMAL HIGH (ref 6–20)
CO2: 32 mmol/L (ref 22–32)
Calcium: 9.3 mg/dL (ref 8.9–10.3)
Chloride: 93 mmol/L — ABNORMAL LOW (ref 101–111)
Creatinine, Ser: 1.76 mg/dL — ABNORMAL HIGH (ref 0.61–1.24)
GFR calc Af Amer: 45 mL/min — ABNORMAL LOW (ref >60)
GFR calc non Af Amer: 39 mL/min — ABNORMAL LOW (ref >60)
Glucose, Bld: 121 mg/dL — ABNORMAL HIGH (ref 65–99)
Potassium: 4.1 mmol/L (ref 3.5–5.1)
Sodium: 135 mmol/L (ref 135–145)

## 2016-02-07 MED ORDER — TRAMADOL HCL 50 MG PO TABS
50.0000 mg | ORAL_TABLET | Freq: Once | ORAL | Status: AC
  Administered 2016-02-07: 50 mg via ORAL
  Filled 2016-02-07 (×2): qty 1

## 2016-02-07 MED ORDER — DIPHENHYDRAMINE HCL 25 MG PO CAPS
25.0000 mg | ORAL_CAPSULE | Freq: Every day | ORAL | Status: DC
  Administered 2016-02-07: 25 mg via ORAL
  Filled 2016-02-07: qty 1

## 2016-02-07 NOTE — Progress Notes (Signed)
Advanced Heart Failure Rounding Note  PCP:  Velna Hatchet, MD       Cardiologist:   Mertie Moores, MD   Subjective:    Admitted from office 02/02/16 with severe dyspnea and volume overload. Cath as below with elevated filling pressures and relatively stable CAD.   Continues to feel better. Less orthopnea. Trying to watch Fluid, now on restriction.   Out 3.2 L and another 3 lbs. Out 13 lbs from admit. Creatinine up 1.5 -> 1.76  Echo 02/02/16 LVEF 60-65%, Grade 3 DD. Mild AI, Mild MR, RV mildly dilated and severely reduced. RA mildly dilated. PA peak pressure 74 mm Hg.   L/RHC 02/05/16 - Mid RCA lesion, 10 %stenosed. - Ost Cx to Prox Cx lesion, 20 %stenosed. - LV end diastolic pressure is moderately elevated. - Hemodynamic findings consistent with severe pulmonary hypertension. - No evidence of discordance on the LV/RV tracing. No evidence of pericardial constriction. - Severly elevated PCWP. - PA sat 51%. CO 6.2 L/min. Cardiac index 2.87. - No significant aortic valve gradient. - There is no aortic valve stenosis.  RHC Procedural Findings: Hemodynamics (mmHg) RA mean 30 RV 87/9  (26) PA 100/56 (72) PCWP 51 AO 142/73 (100) Cardiac Output (Fick) 6.21 Cardiac Index (Fick) 2.87  Objective:   Weight Range: 206 lb 11.2 oz (93.8 kg) Body mass index is 28.83 kg/m.   Vital Signs:   Temp:  [98.7 F (37.1 C)-98.8 F (37.1 C)] 98.7 F (37.1 C) (10/11 0500) Pulse Rate:  [73-80] 80 (10/11 0959) Resp:  [18-21] 18 (10/11 0500) BP: (124-126)/(52-65) 125/65 (10/11 0959) SpO2:  [92 %-100 %] 92 % (10/11 0954) Weight:  [206 lb 11.2 oz (93.8 kg)] 206 lb 11.2 oz (93.8 kg) (10/11 0500) Last BM Date: 02/02/16  Weight change: Filed Weights   02/05/16 0621 02/06/16 0500 02/07/16 0500  Weight: 215 lb 6.4 oz (97.7 kg) 209 lb (94.8 kg) 206 lb 11.2 oz (93.8 kg)    Intake/Output:   Intake/Output Summary (Last 24 hours) at 02/07/16 1006 Last data filed at 02/07/16 1001  Gross per  24 hour  Intake             1993 ml  Output             4000 ml  Net            -2007 ml     Physical Exam: General:  Well appearing. No resp difficulty HEENT: normal Neck: supple. JVP 9-10. Carotids 2+ bilat; no bruits. No thyromegaly or nodule noted.  Cor: PMI nondisplaced. RRr. No M/G/R Lungs: Clear, normal effort Abdomen: soft, NT, ND, no HSM. No bruits or masses. +BS . Umbilical hernia present. Extremities: no cyanosis, clubbing, rash, Trace to 1 + Ankle edema L>R.  Neuro: alert & orientedx3, cranial nerves grossly intact. moves all 4 extremities w/o difficulty. Affect pleasant   Telemetry: Reviewed, V-paced 80s  Labs: CBC  Recent Labs  02/05/16 0342 02/06/16 0445  WBC 7.8 8.7  HGB 8.5* 9.0*  HCT 30.4* 31.9*  MCV 84.7 84.2  PLT 196 [id number]   Basic Metabolic Panel  Recent Labs  02/06/16 0445 02/07/16 0222  NA 136 135  K 3.9 4.1  CL 95* 93*  CO2 31 32  GLUCOSE 110* 121*  BUN 14 21*  CREATININE 1.49* 1.76*  CALCIUM 9.3 9.3   Liver Function Tests No results for input(s): AST, ALT, ALKPHOS, BILITOT, PROT, ALBUMIN in the last 72 hours. No results for input(s): LIPASE, AMYLASE  in the last 72 hours. Cardiac Enzymes No results for input(s): CKTOTAL, CKMB, CKMBINDEX, TROPONINI in the last 72 hours.  BNP: BNP (last 3 results)  Recent Labs  09/02/15 2011 11/23/15 1432 01/29/16 1543  BNP 206.2* 159.2* 406.6*    ProBNP (last 3 results)  Recent Labs  11/07/15 1155 01/15/16 1708  PROBNP 178.0* 470.0*     D-Dimer No results for input(s): DDIMER in the last 72 hours. Hemoglobin A1C No results for input(s): HGBA1C in the last 72 hours. Fasting Lipid Panel No results for input(s): CHOL, HDL, LDLCALC, TRIG, CHOLHDL, LDLDIRECT in the last 72 hours. Thyroid Function Tests No results for input(s): TSH, T4TOTAL, T3FREE, THYROIDAB in the last 72 hours.  Invalid input(s): FREET3  Other results:     Imaging/Studies:  No results found.  Latest  Echo  Latest Cath   Medications:     Scheduled Medications: . amLODipine  5 mg Oral Daily  . atorvastatin  40 mg Oral q1800  . dabigatran  150 mg Oral Q12H  . diphenhydrAMINE  25 mg Oral QHS  . fluticasone  2 spray Each Nare Daily  . furosemide  80 mg Intravenous BID  . gabapentin  900 mg Oral QHS  . guaiFENesin  600 mg Oral BID  . levothyroxine  175 mcg Oral QAC breakfast  . loratadine  10 mg Oral Daily  . metoprolol  50 mg Oral BID  . mometasone-formoterol  2 puff Inhalation BID  . pantoprazole  40 mg Oral Daily  . potassium chloride  20 mEq Oral Daily  . rOPINIRole  1 mg Oral Daily  . sodium chloride flush  3 mL Intravenous Q12H  . sodium chloride flush  3 mL Intravenous Q12H  . spironolactone  12.5 mg Oral Daily  . traMADol  50 mg Oral Daily    Infusions:    PRN Medications: sodium chloride, sodium chloride, acetaminophen, albuterol, budesonide, ipratropium, ondansetron (ZOFRAN) IV, sodium chloride flush, sodium chloride flush   Assessment   1. 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 Atrial Fibrillation 3. Pulmonary HTN 4. CAD - mild.  Plan    Volume status improved on IV lasix 80 mg BID. Out 14.6 L and down 13 lbs thus far.  Orthopnea improved.    Taking lasix 40 mg BID PTA. Likely needs at least 60 mg BID and to further watch fluid restriction at home.    Creatinine mildly up. Think can transition to po meds tonight vs tomorrow am.   Needs repeat Echo vs RHC prior to d/c. Will discuss timing with MD.   No PT follow up recommended. Walked 350 feet. 02 92% on 2/L min which improved 95% with pursed lip breathing.  Echo 02/02/16 LVEF 60-65%, Grade 3 DD. Mild AI, Mild MR, RV mildly dilated and severely reduced. RA mildly dilated. PA peak pressure 74 mm Hg.   Length of Stay: 5  Shirley Friar PA-C 02/07/2016, 10:06 AM  Advanced Heart Failure Team Pager [phone] (M-F; 7a - 4p)  Please contact [facility] for night-coverage after hours  (4p -7a ) and weekends on [email or web address]  Patient seen and examined with Oda Kilts, PA-C. We discussed all aspects of the encounter. I agree with the assessment and plan as stated above.   Volume status improving. Weight down 16 pounds. Creatinine bumping slightly but urine still very clear. Will continue IV diuresis one more day. RHC, echo images and PFTs results also reviewed with him today. On RHC PA pressure > 100  withe LVEDP 30 (Wedge 50 but probably not accurate). RV moderately HK on echo. PFTs with moderate to severe obstruction/restriction.   Pulmonary HTN likely mixed picture. Will repeat echo in am after diuresis. Hopefully can go home on torsemide tomorrow. Will likely need repeat RHC as outpatient with Dr. Aundra Dubin over next few weeks.   Bensimhon, Daniel,MD 3:39 PM

## 2016-02-07 NOTE — Evaluation (Signed)
Physical Therapy Evaluation & discharge Patient Details Name: [name] MRN: [id number] DOB: [birth year] Today's Date: 02/07/2016   History of Present Illness  65 y/o WM admitted with acute on chronic diasolic CHF and dyspnea with walking as little as 10 feet. PMH includes valve replacement  Clinical Impression  Pt MOD I with all mobility including stairs with step through pattern. o2 92-95% with gait on 2 L/min.  Pt does not need further skilled PT intervention, but encouraged him to walk with nursing or family A with o2 tank.  Will sign off.    Follow Up Recommendations No PT follow up    Equipment Recommendations  None recommended by PT    Recommendations for Other Services       Precautions / Restrictions Precautions Precautions: Other (comment) Precaution Comments: o2 dependent      Mobility  Bed Mobility               General bed mobility comments: Pt up in recliner upon arrival  Transfers Overall transfer level: Modified independent                  Ambulation/Gait Ambulation/Gait assistance: Modified independent (Device/Increase time) Ambulation Distance (Feet): 350 Feet Assistive device: None Gait Pattern/deviations: Step-through pattern   Gait velocity interpretation: at or above normal speed for age/gender General Gait Details: Pt ambulated with MOD I with no LOB when introduced aspects of dynamic gait index.  o2 92% on 2 L/min which improved to 95% with review of pursed lip breathing  Stairs Stairs: Yes Stairs assistance: Modified independent (Device/Increase time) Stair Management: One rail Left;Alternating pattern Number of Stairs: 5 General stair comments: Pt able to negotiate stairs with no difficulty with step through pattern and no SOB  Wheelchair Mobility    Modified Rankin (Stroke Patients Only)       Balance Overall balance assessment: Modified Independent                                            Pertinent Vitals/Pain Pain Assessment: No/denies pain    Home Living Family/patient expects to be discharged to:: Private residence Living Arrangements: Spouse/significant other Available Help at Discharge: Family;Available 24 hours/day Type of Home: House Home Access: Ramped entrance;Stairs to enter Entrance Stairs-Rails: Can reach both Entrance Stairs-Number of Steps: 5 Home Layout: One level Home Equipment: Shower seat - built in;Walker - 2 wheels Additional Comments: o2    Prior Function Level of Independence: Independent               Hand Dominance        Extremity/Trunk Assessment   Upper Extremity Assessment: Overall WFL for tasks assessed           Lower Extremity Assessment: Overall WFL for tasks assessed         Communication   Communication: No difficulties  Cognition Arousal/Alertness: Awake/alert Behavior During Therapy: WFL for tasks assessed/performed Overall Cognitive Status: Within Functional Limits for tasks assessed                      General Comments      Exercises     Assessment/Plan    PT Assessment Patent does not need any further PT services  PT Problem List            PT Treatment Interventions  PT Goals (Current goals can be found in the Care Plan section)  Acute Rehab PT Goals Patient Stated Goal: go home PT Goal Formulation: All assessment and education complete, DC therapy    Frequency     Barriers to discharge        Co-evaluation               End of Session Equipment Utilized During Treatment: Gait belt;Oxygen Activity Tolerance: Patient tolerated treatment well Patient left: in chair;with call bell/phone within reach Nurse Communication: Mobility status         Time: [id number] PT Time Calculation (min) (ACUTE ONLY): 16 min   Charges:   PT Evaluation $PT Eval Low Complexity: 1 Procedure     PT G Codes:        SMITH,KAREN LUBECK 02/07/2016, 9:58 AM

## 2016-02-08 ENCOUNTER — Telehealth: Payer: Self-pay | Admitting: Cardiovascular Disease

## 2016-02-08 ENCOUNTER — Inpatient Hospital Stay (HOSPITAL_COMMUNITY): Payer: Medicare Other

## 2016-02-08 DIAGNOSIS — I509 Heart failure, unspecified: Secondary | ICD-10-CM

## 2016-02-08 DIAGNOSIS — I481 Persistent atrial fibrillation: Secondary | ICD-10-CM

## 2016-02-08 LAB — ECHOCARDIOGRAM COMPLETE
Height: 71 in
Weight: 3288 oz

## 2016-02-08 LAB — BASIC METABOLIC PANEL
Anion gap: 6 (ref 5–15)
BUN: 26 mg/dL — ABNORMAL HIGH (ref 6–20)
CO2: 34 mmol/L — ABNORMAL HIGH (ref 22–32)
Calcium: 9.4 mg/dL (ref 8.9–10.3)
Chloride: 95 mmol/L — ABNORMAL LOW (ref 101–111)
Creatinine, Ser: 1.73 mg/dL — ABNORMAL HIGH (ref 0.61–1.24)
GFR calc Af Amer: 46 mL/min — ABNORMAL LOW (ref >60)
GFR calc non Af Amer: 39 mL/min — ABNORMAL LOW (ref >60)
Glucose, Bld: 125 mg/dL — ABNORMAL HIGH (ref 65–99)
Potassium: 4.3 mmol/L (ref 3.5–5.1)
Sodium: 135 mmol/L (ref 135–145)

## 2016-02-08 MED ORDER — TORSEMIDE 20 MG PO TABS: 40.0000 mg | ORAL_TABLET | Freq: Every day | ORAL | 6 refills | Status: DC

## 2016-02-08 MED ORDER — POTASSIUM CHLORIDE ER 10 MEQ PO TBCR: 20.0000 meq | EXTENDED_RELEASE_TABLET | Freq: Every day | ORAL | 6 refills | Status: DC

## 2016-02-08 MED ORDER — TORSEMIDE 20 MG PO TABS: 40.0000 mg | ORAL_TABLET | Freq: Every day | ORAL | Status: DC

## 2016-02-08 NOTE — Discharge Summary (Signed)
Advanced Heart Failure Discharge Note   Discharge Summary   Patient ID: [name] MRN: [id number], DOB/AGE: 1951/02/11 65 y.o. Admit date: 02/02/2016 D/C date:     02/08/2016   Primary Discharge Diagnoses:  1. 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 Atrial Fibrillation 3. 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 CAD - mild. 5. Dual chamber PPM  Hospital Course:   [name] is a 65 y.o. male with history of HTN, Afib s/p ablation s/p Pacer, Normal cors by cath in 2014, and Aortic Stenosis s/p AVR + MAZE.  Pt seen in Dr. Acie Fredrickson office 02/02/16 with severe DOE with any exercise or walking 10-20 feet. Pt admitted and planned for [facility] 02/05/16. Diuresed with IV lasix.   Echo 02/02/16 LVEF 60-65%, Grade 3 DD. Mild AI, Mild MR, RV mildly dilated and severely reduced. RA mildly dilated. PA peak pressure 74 mm Hg. Concerns for restrictive cardiomyopathy.  L/RHC 02/05/16 (Full report below) with no significant CAD. RHC showed significant difference between PCWP (50) and LVEDP (30) suggestive of MS but no MS on Echo. Also showed significant RV dysfunction. Suspected to possibly be artifactual.   CHF team consulted for significant filling pressures.  Pt had rapid improvement of symptoms with continued diuresis. Pt had mild Creatinine bump 02/07/16, but continued with very clear urine production so IV lasix continued for one more day. Overall patient Diuresed 15.2 L and down 14 lbs.  Echo 02/08/16 LVEF 55-60%, Restrictive physiology, (?)RV normal. No assessment of PA pressures.  Pt seen am of 02/08/16 and was thought to be stable pending repeat Echo.  He will be discharged to home in stable condition with close follow up as below. Will consider RHC as outpatient depending on symptoms.  Pt will continue [facility] for 02.  Discharge Weight Range: 205 lbs Discharge Vitals: Blood pressure (!) 130/57, pulse 72, temperature 98.6 F (37 C), temperature source Oral, resp. rate  16, height 5\' 11"  (1.803 m), weight 205 lb 8 oz (93.2 kg), SpO2 97 %.  Labs: Lab Results  Component Value Date   WBC 8.7 02/06/2016   HGB 9.0 (L) 02/06/2016   HCT 31.9 (L) 02/06/2016   MCV 84.2 02/06/2016   PLT 234 02/06/2016     Recent Labs Lab 02/08/16 0410  NA 135  K 4.3  CL 95*  CO2 34*  BUN 26*  CREATININE 1.73*  CALCIUM 9.4  GLUCOSE 125*   Lab Results  Component Value Date   CHOL 114 (L) 08/03/2015   HDL 30 (L) 08/03/2015   LDLCALC 62 08/03/2015   TRIG 111 08/03/2015   BNP (last 3 results)  Recent Labs  09/02/15 2011 11/23/15 1432 01/29/16 1543  BNP 206.2* 159.2* 406.6*    ProBNP (last 3 results)  Recent Labs  11/07/15 1155 01/15/16 1708  PROBNP 178.0* 470.0*     Diagnostic Studies/Procedures   L/RHC 02/05/16 - Mid RCA lesion, 10 %stenosed. - Ost Cx to Prox Cx lesion, 20 %stenosed. - LV end diastolic pressure is moderately elevated. - Hemodynamic findings consistent with severe pulmonary hypertension. - No evidence of discordance on the LV/RV tracing. No evidence of pericardial constriction. - Severly elevated PCWP. - PA sat 51%. CO 6.2 L/min. Cardiac index 2.87. - No significant aortic valve gradient. - There is no aortic valve stenosis.  RHC Procedural Findings: Hemodynamics (mmHg) RA mean 30 RV 87/9  (26) PA 100/56 (72) PCWP 51 AO 142/73 (100) Cardiac Output (Fick) 6.21 Cardiac Index (Fick) 2.87  Discharge  Medications     Medication List    STOP taking these medications   furosemide 40 MG tablet Commonly known as:  LASIX     TAKE these medications   albuterol 108 (90 Base) MCG/ACT inhaler Commonly known as:  PROVENTIL HFA;VENTOLIN HFA Inhale 2 puffs into the lungs every 6 (six) hours as needed for wheezing or shortness of breath.   amLODipine 10 MG tablet Commonly known as:  NORVASC Take 0.5 tablets (5 mg total) by mouth daily.   atorvastatin 40 MG tablet Commonly known as:  LIPITOR TAKE 1 TABLET(40 MG) BY MOUTH  DAILY   budesonide 0.25 MG/2ML nebulizer solution Commonly known as:  PULMICORT Take 2 mLs (0.25 mg total) by nebulization 2 (two) times daily. What changed:  when to take this  reasons to take this   budesonide-formoterol 160-4.5 MCG/ACT inhaler Commonly known as:  SYMBICORT Inhale 2 puffs into the lungs 2 (two) times daily.   dabigatran 150 MG Caps capsule Commonly known as:  PRADAXA Take 1 capsule (150 mg total) by mouth 2 (two) times daily.   diphenhydrAMINE 25 MG tablet Commonly known as:  BENADRYL Take 25 mg by mouth at bedtime.   fexofenadine 180 MG tablet Commonly known as:  ALLEGRA Take 1 tablet (180 mg total) by mouth daily.   fluticasone 50 MCG/ACT nasal spray Commonly known as:  FLONASE Place into both nostrils 2 (two) times daily as needed for allergies or rhinitis.   gabapentin 300 MG capsule Commonly known as:  NEURONTIN Take 900 mg by mouth at bedtime.   guaiFENesin 600 MG 12 hr tablet Commonly known as:  MUCINEX Take 600 mg by mouth 2 (two) times daily.   ipratropium 0.02 % nebulizer solution Commonly known as:  ATROVENT Take 2.5 mLs (0.5 mg total) by nebulization 4 (four) times daily. What changed:  when to take this  reasons to take this   levalbuterol 0.63 MG/3ML nebulizer solution Commonly known as:  XOPENEX Take 3 mLs (0.63 mg total) by nebulization every 6 (six) hours as needed for wheezing or shortness of breath. What changed:  when to take this   levothyroxine 175 MCG tablet Commonly known as:  SYNTHROID, LEVOTHROID Take 175 mcg by mouth daily before breakfast.   MELATONEX 3-10 MG Tbcr Generic drug:  Melatonin-Pyridoxine Take 1 tablet by mouth at bedtime.   metFORMIN 500 MG (MOD) 24 hr tablet Commonly known as:  GLUMETZA Take 500 mg by mouth daily with breakfast.   metoprolol 50 MG tablet Commonly known as:  LOPRESSOR Take 1 tablet (50 mg total) by mouth 2 (two) times daily.   pantoprazole 40 MG tablet Commonly known as:   PROTONIX Take 40 mg by mouth daily.   potassium chloride 10 MEQ tablet Commonly known as:  K-DUR Take 2 tablets (20 mEq total) by mouth daily.   promethazine 25 MG tablet Commonly known as:  PHENERGAN Take 25 mg by mouth every 6 (six) hours as needed for nausea or vomiting.   rOPINIRole 1 MG tablet Commonly known as:  REQUIP Take 1 tablet (1 mg total) by mouth daily.   spironolactone 25 MG tablet Commonly known as:  ALDACTONE Take 0.5 tablets (12.5 mg total) by mouth daily.   torsemide 20 MG tablet Commonly known as:  DEMADEX Take 2 tablets (40 mg total) by mouth daily. Start taking on:  02/09/2016   traMADol 50 MG tablet Commonly known as:  ULTRAM Take 50 mg by mouth daily. Reported on 10/17/2015   valACYclovir 1000  MG tablet Commonly known as:  VALTREX Take 1 tablet by mouth every 12 (twelve) hours as needed (fever blisters). Reported on 10/17/2015   Vitamin D 2000 units Caps Take 2,000 Units by mouth daily.       Disposition   The patient will be discharged in stable condition to home. Discharge Instructions    (HEART FAILURE PATIENTS) Call MD:  Anytime you have any of the following symptoms: 1) 3 pound weight gain in 24 hours or 5 pounds in 1 week 2) shortness of breath, with or without a dry hacking cough 3) swelling in the hands, feet or stomach 4) if you have to sleep on extra pillows at night in order to breathe.    Complete by:  As directed    Diet - low sodium heart healthy    Complete by:  As directed    Heart Failure patients record your daily weight using the same scale at the same time of day    Complete by:  As directed    Increase activity slowly    Complete by:  As directed      Follow-up Information    [facility] [facility] Follow up on 02/16/2016.   Specialty:  Cardiology Why:  at 1030 for post hospital follow up. Please bring all of your medication to your visit. Code for parking is 4000.   Enter through the  construction entrance on [address], [place] garage will be on the right down a small hill.  Contact information: [address] [id number] [place] [phone]            Duration of Discharge Encounter: Greater than 35 minutes   Signed, Shirley Friar PA-C 02/08/2016, 8:02 PM

## 2016-02-08 NOTE — Discharge Instructions (Signed)

## 2016-02-08 NOTE — Progress Notes (Signed)
Advanced Heart Failure Rounding Note  PCP:  Velna Hatchet, MD       Cardiologist:   Mertie Moores, MD   Subjective:    Admitted from office 02/02/16 with severe dyspnea and volume overload. Cath as below with elevated filling pressures and relatively stable CAD.   Feeling great this morning. Ready to go home.   UO slowing. Weight down another lb. Out 14 lbs from admit. Creatinine mildly elevated but stable. 1.5 -> 1.76 -> 1.73  Echo 02/02/16 LVEF 60-65%, Grade 3 DD. Mild AI, Mild MR, RV mildly dilated and severely reduced. RA mildly dilated. PA peak pressure 74 mm Hg.   L/RHC 02/05/16 - Mid RCA lesion, 10 %stenosed. - Ost Cx to Prox Cx lesion, 20 %stenosed. - LV end diastolic pressure is moderately elevated. - Hemodynamic findings consistent with severe pulmonary hypertension. - No evidence of discordance on the LV/RV tracing. No evidence of pericardial constriction. - Severly elevated PCWP. - PA sat 51%. CO 6.2 L/min. Cardiac index 2.87. - No significant aortic valve gradient. - There is no aortic valve stenosis.  RHC Procedural Findings: Hemodynamics (mmHg) RA mean 30 RV 87/9  (26) PA 100/56 (72) PCWP 51 AO 142/73 (100) Cardiac Output (Fick) 6.21 Cardiac Index (Fick) 2.87  Objective:   Weight Range: 205 lb 8 oz (93.2 kg) Body mass index is 28.66 kg/m.   Vital Signs:   Temp:  [97.5 F (36.4 C)-98.7 F (37.1 C)] 98.6 F (37 C) (10/12 0535) Pulse Rate:  [72-80] 72 (10/12 0535) Resp:  [16-18] 16 (10/12 0535) BP: (125-131)/(57-65) 130/57 (10/12 0535) SpO2:  [92 %-99 %] 97 % (10/12 0729) Weight:  [205 lb 8 oz (93.2 kg)] 205 lb 8 oz (93.2 kg) (10/12 0535) Last BM Date: 02/02/16  Weight change: Filed Weights   02/06/16 0500 02/07/16 0500 02/08/16 0535  Weight: 209 lb (94.8 kg) 206 lb 11.2 oz (93.8 kg) 205 lb 8 oz (93.2 kg)    Intake/Output:   Intake/Output Summary (Last 24 hours) at 02/08/16 0826 Last data filed at 02/08/16 0647  Gross per 24 hour    Intake             1783 ml  Output             1900 ml  Net             -117 ml     Physical Exam: General:  Well appearing. No resp difficulty HEENT: normal Neck: supple. JVP 8. Carotids 2+ bilat; no bruits. No thyromegaly or nodule noted.  Cor: PMI nondisplaced. RRr. No M/G/R Lungs: CTAB, normal effort Abdomen: soft, NT, ND, no HSM. No bruits or masses. +BS . Umbilical hernia present. Extremities: no cyanosis, clubbing, rash, Trace to 1 + Ankle edema L>R.  Neuro: alert & orientedx3, cranial nerves grossly intact. moves all 4 extremities w/o difficulty. Affect pleasant  Telemetry: Reviewed, V-paced 80s   Labs: CBC  Recent Labs  02/06/16 0445  WBC 8.7  HGB 9.0*  HCT 31.9*  MCV 84.2  PLT [id number]   Basic Metabolic Panel  Recent Labs  02/07/16 0222 02/08/16 0410  NA 135 135  K 4.1 4.3  CL 93* 95*  CO2 32 34*  GLUCOSE 121* 125*  BUN 21* 26*  CREATININE 1.76* 1.73*  CALCIUM 9.3 9.4   Liver Function Tests No results for input(s): AST, ALT, ALKPHOS, BILITOT, PROT, ALBUMIN in the last 72 hours. No results for input(s): LIPASE, AMYLASE in the last 72 hours. Cardiac  Enzymes No results for input(s): CKTOTAL, CKMB, CKMBINDEX, TROPONINI in the last 72 hours.  BNP: BNP (last 3 results)  Recent Labs  09/02/15 2011 11/23/15 1432 01/29/16 1543  BNP 206.2* 159.2* 406.6*    ProBNP (last 3 results)  Recent Labs  11/07/15 1155 01/15/16 1708  PROBNP 178.0* 470.0*     D-Dimer No results for input(s): DDIMER in the last 72 hours. Hemoglobin A1C No results for input(s): HGBA1C in the last 72 hours. Fasting Lipid Panel No results for input(s): CHOL, HDL, LDLCALC, TRIG, CHOLHDL, LDLDIRECT in the last 72 hours. Thyroid Function Tests No results for input(s): TSH, T4TOTAL, T3FREE, THYROIDAB in the last 72 hours.  Invalid input(s): FREET3  Other results:     Imaging/Studies:  No results found.  Latest Echo  Latest Cath   Medications:     Scheduled  Medications: . amLODipine  5 mg Oral Daily  . atorvastatin  40 mg Oral q1800  . dabigatran  150 mg Oral Q12H  . diphenhydrAMINE  25 mg Oral QHS  . fluticasone  2 spray Each Nare Daily  . furosemide  80 mg Intravenous BID  . gabapentin  900 mg Oral QHS  . guaiFENesin  600 mg Oral BID  . levothyroxine  175 mcg Oral QAC breakfast  . loratadine  10 mg Oral Daily  . metoprolol  50 mg Oral BID  . mometasone-formoterol  2 puff Inhalation BID  . pantoprazole  40 mg Oral Daily  . potassium chloride  20 mEq Oral Daily  . rOPINIRole  1 mg Oral Daily  . sodium chloride flush  3 mL Intravenous Q12H  . sodium chloride flush  3 mL Intravenous Q12H  . spironolactone  12.5 mg Oral Daily  . traMADol  50 mg Oral Daily    Infusions:    PRN Medications: sodium chloride, sodium chloride, acetaminophen, albuterol, budesonide, ipratropium, ondansetron (ZOFRAN) IV, sodium chloride flush, sodium chloride flush   Assessment   1. 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 Atrial Fibrillation 3. 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 CAD - mild. 5. Dual chamber PPM  Plan    Volume status improved. Creatinine mildly elevated but stable.  Stop IV lasix (got dose this am) Will start 40 mg torsemide tomorrow.  Repeat Echo this am. Likely home after.  No PT follow up recommended. Walked 350 feet. 02 92% on 2/L min which improved 95% with pursed lip breathing.  Echo 02/02/16 LVEF 60-65%, Grade 3 DD. Mild AI, Mild MR, RV mildly dilated and severely reduced. RA mildly dilated. PA peak pressure 74 mm Hg.   Length of Stay: 6  Shirley Friar PA-C 02/08/2016, 8:26 AM  Advanced Heart Failure Team Pager [phone] (M-F; 7a - 4p)  Please contact [facility] for night-coverage after hours (4p -7a ) and weekends on [email or web address]  Patient seen with PA, agree with the above note.  Volume looks ok and creatinine stable.  He will start torsemide 40 mg daily and go home today. Will  have close followup in CHF clinic in 1 week.   Repeat echo was done today.  Similar to prior from earlier in hospitalization but PA pressure not estimated.   Loralie Champagne 02/08/2016 1:00 PM

## 2016-02-08 NOTE — Progress Notes (Signed)
*  PRELIMINARY RESULTS* Echocardiogram 2D Echocardiogram has been performed.  Leavy Cella 02/08/2016, 11:23 AM

## 2016-02-08 NOTE — Telephone Encounter (Signed)
New Message  Pts wife voiced she needs an referral to [place] for simply go mini/evaluation from [place].  Please f/u with pt

## 2016-02-08 NOTE — Telephone Encounter (Signed)
Would you like to order O2 or should I forward to pulmonologist?

## 2016-02-09 ENCOUNTER — Encounter (HOSPITAL_COMMUNITY): Payer: Medicare Other

## 2016-02-09 ENCOUNTER — Encounter: Payer: Self-pay | Admitting: Cardiology

## 2016-02-09 NOTE — Progress Notes (Signed)
Monday February 05, 2016

## 2016-02-11 NOTE — Telephone Encounter (Signed)
Its not clear if he is going to follow up with me or CHF clinic He was discharged by the CHF team. If we are ordering the home O2, he will need to come in for pulse oxymetry testing as I do not see the required information in the DC note. If he is following up in the CHF clinic, he may have the testing performed there.

## 2016-02-11 NOTE — Telephone Encounter (Signed)
I think he has a followup appt with [place], we can arrange when he comes in.

## 2016-02-12 ENCOUNTER — Encounter (HOSPITAL_COMMUNITY): Payer: Medicare Other

## 2016-02-12 NOTE — Telephone Encounter (Signed)
Patient has appointment at HF clinic on 10/20

## 2016-02-14 ENCOUNTER — Encounter (HOSPITAL_COMMUNITY): Payer: Medicare Other

## 2016-02-16 ENCOUNTER — Encounter (HOSPITAL_COMMUNITY): Payer: Medicare Other

## 2016-02-16 ENCOUNTER — Encounter (HOSPITAL_COMMUNITY): Payer: Self-pay

## 2016-02-16 ENCOUNTER — Ambulatory Visit (HOSPITAL_COMMUNITY)
Admission: RE | Admit: 2016-02-16 | Discharge: 2016-02-16 | Disposition: A | Discharge status: Home / Self Care | Payer: Medicare Other | Source: Ambulatory Visit | Attending: Internal Medicine | Admitting: Internal Medicine

## 2016-02-16 VITALS — BP 154/70 | HR 69 | Wt 210.4 lb

## 2016-02-16 DIAGNOSIS — I10 Essential (primary) hypertension: Secondary | ICD-10-CM | POA: Diagnosis not present

## 2016-02-16 DIAGNOSIS — G4733 Obstructive sleep apnea (adult) (pediatric): Secondary | ICD-10-CM | POA: Insufficient documentation

## 2016-02-16 DIAGNOSIS — I2721 Secondary pulmonary arterial hypertension: Secondary | ICD-10-CM | POA: Insufficient documentation

## 2016-02-16 DIAGNOSIS — I110 Hypertensive heart disease with heart failure: Secondary | ICD-10-CM | POA: Insufficient documentation

## 2016-02-16 DIAGNOSIS — Z881 Allergy status to other antibiotic agents status: Secondary | ICD-10-CM | POA: Insufficient documentation

## 2016-02-16 DIAGNOSIS — E785 Hyperlipidemia, unspecified: Secondary | ICD-10-CM | POA: Insufficient documentation

## 2016-02-16 DIAGNOSIS — Z923 Personal history of irradiation: Secondary | ICD-10-CM | POA: Diagnosis not present

## 2016-02-16 DIAGNOSIS — Z953 Presence of xenogenic heart valve: Secondary | ICD-10-CM

## 2016-02-16 DIAGNOSIS — I350 Nonrheumatic aortic (valve) stenosis: Secondary | ICD-10-CM | POA: Insufficient documentation

## 2016-02-16 DIAGNOSIS — I2510 Atherosclerotic heart disease of native coronary artery without angina pectoris: Secondary | ICD-10-CM | POA: Insufficient documentation

## 2016-02-16 DIAGNOSIS — E669 Obesity, unspecified: Secondary | ICD-10-CM | POA: Insufficient documentation

## 2016-02-16 DIAGNOSIS — G2581 Restless legs syndrome: Secondary | ICD-10-CM | POA: Diagnosis not present

## 2016-02-16 DIAGNOSIS — Z9111 Patient's noncompliance with dietary regimen: Secondary | ICD-10-CM | POA: Insufficient documentation

## 2016-02-16 DIAGNOSIS — Z9889 Other specified postprocedural states: Secondary | ICD-10-CM | POA: Insufficient documentation

## 2016-02-16 DIAGNOSIS — Z823 Family history of stroke: Secondary | ICD-10-CM | POA: Insufficient documentation

## 2016-02-16 DIAGNOSIS — E039 Hypothyroidism, unspecified: Secondary | ICD-10-CM | POA: Insufficient documentation

## 2016-02-16 DIAGNOSIS — K219 Gastro-esophageal reflux disease without esophagitis: Secondary | ICD-10-CM | POA: Insufficient documentation

## 2016-02-16 DIAGNOSIS — M797 Fibromyalgia: Secondary | ICD-10-CM | POA: Diagnosis not present

## 2016-02-16 DIAGNOSIS — Z6829 Body mass index (BMI) 29.0-29.9, adult: Secondary | ICD-10-CM | POA: Insufficient documentation

## 2016-02-16 DIAGNOSIS — I481 Persistent atrial fibrillation: Secondary | ICD-10-CM | POA: Diagnosis not present

## 2016-02-16 DIAGNOSIS — Z950 Presence of cardiac pacemaker: Secondary | ICD-10-CM | POA: Insufficient documentation

## 2016-02-16 DIAGNOSIS — I5032 Chronic diastolic (congestive) heart failure: Secondary | ICD-10-CM | POA: Diagnosis present

## 2016-02-16 DIAGNOSIS — I4819 Other persistent atrial fibrillation: Secondary | ICD-10-CM

## 2016-02-16 DIAGNOSIS — I340 Nonrheumatic mitral (valve) insufficiency: Secondary | ICD-10-CM | POA: Diagnosis not present

## 2016-02-16 DIAGNOSIS — Z7984 Long term (current) use of oral hypoglycemic drugs: Secondary | ICD-10-CM | POA: Diagnosis not present

## 2016-02-16 DIAGNOSIS — Z8249 Family history of ischemic heart disease and other diseases of the circulatory system: Secondary | ICD-10-CM | POA: Insufficient documentation

## 2016-02-16 DIAGNOSIS — J45909 Unspecified asthma, uncomplicated: Secondary | ICD-10-CM | POA: Diagnosis not present

## 2016-02-16 DIAGNOSIS — I482 Chronic atrial fibrillation: Secondary | ICD-10-CM | POA: Insufficient documentation

## 2016-02-16 DIAGNOSIS — I272 Pulmonary hypertension, unspecified: Secondary | ICD-10-CM

## 2016-02-16 DIAGNOSIS — Z888 Allergy status to other drugs, medicaments and biological substances status: Secondary | ICD-10-CM | POA: Diagnosis not present

## 2016-02-16 DIAGNOSIS — Z87891 Personal history of nicotine dependence: Secondary | ICD-10-CM | POA: Insufficient documentation

## 2016-02-16 DIAGNOSIS — Z7901 Long term (current) use of anticoagulants: Secondary | ICD-10-CM | POA: Insufficient documentation

## 2016-02-16 DIAGNOSIS — E119 Type 2 diabetes mellitus without complications: Secondary | ICD-10-CM | POA: Insufficient documentation

## 2016-02-16 LAB — BRAIN NATRIURETIC PEPTIDE: B Natriuretic Peptide: 177.8 pg/mL — ABNORMAL HIGH (ref 0.0–100.0)

## 2016-02-16 LAB — BASIC METABOLIC PANEL
Anion gap: 9 (ref 5–15)
BUN: 25 mg/dL — ABNORMAL HIGH (ref 6–20)
CO2: 31 mmol/L (ref 22–32)
Calcium: 9.9 mg/dL (ref 8.9–10.3)
Chloride: 98 mmol/L — ABNORMAL LOW (ref 101–111)
Creatinine, Ser: 1.63 mg/dL — ABNORMAL HIGH (ref 0.61–1.24)
GFR calc Af Amer: 49 mL/min — ABNORMAL LOW (ref >60)
GFR calc non Af Amer: 42 mL/min — ABNORMAL LOW (ref >60)
Glucose, Bld: 134 mg/dL — ABNORMAL HIGH (ref 65–99)
Potassium: 3.9 mmol/L (ref 3.5–5.1)
Sodium: 138 mmol/L (ref 135–145)

## 2016-02-16 MED ORDER — SPIRONOLACTONE 25 MG PO TABS: 25.0000 mg | ORAL_TABLET | Freq: Every day | ORAL | 3 refills | Status: AC

## 2016-02-16 NOTE — Progress Notes (Signed)
Advanced Heart Failure Clinic Note   Primary Care: Velna Hatchet, MD Primary Cardiologist: Dr. Acie Fredrickson Primary HF: Dr. Haroldine Laws   HPI:  [name] is a 65 y.o. male with history of HTN, Afib s/p ablation s/p Pacer, Normal cors by cath in 2014, and Aortic Stenosis s/p AVR + MAZE.   Admitted from Dr. Acie Fredrickson office 02/02/16 with severe DOE with any exercise or walking 10-20 feet. Pt admitted and planned for [facility] 02/05/16. Diuresed with IV lasix with rapid improvement of symptoms. Overall diuresed 15.2 L and down 14 lbs. Discharge weight was 205 lbs.  Echo 02/02/16 LVEF 60-65%, Grade 3 DD. Mild AI, Mild MR, RV mildly dilated and severely reduced. RA mildly dilated. PA peak pressure 74 mm Hg. Concerns for restrictive cardiomyopathy.  L/RHC 02/05/16 with no significant CAD. RHC showed significant difference between PCWP (50) and LVEDP (30) suggestive of MS but no MS on Echo. Also showed significant RV dysfunction. Suspected to possibly be artifactual.     Echo 02/08/16 LVEF 55-60%, Restrictive physiology, (?)RV normal. No assessment of PA pressures.  He presents today for post hospital follow.  Weight up 5 lbs.  Feeling good overall. Getting around better. Doesn't get SOB with showering, even without 02. He continues on chronic 02 via [facility].  No SOB walking around house. Hasn't really been out of the house since he got home. Was only walking 5-20 feet PTA before DOE, but walked up a hill yesterday without SOB. Peeing better on torsemide. Denies orthopnea, but sleeps in a recliner (laid back) chronically. Occasional lightheadedness with rapid standing but not marked or limiting, worse when his 02 tank runs out. Tries to watch fluid and salt. Had [place] (total meal > 1700 mg sodium)  L/RHC 02/05/16 - Mid RCA lesion, 10 %stenosed. - Ost Cx to Prox Cx lesion, 20 %stenosed. - LV end diastolic pressure is moderately elevated. - Hemodynamic findings consistent with severe pulmonary  hypertension. - No evidence of discordance on the LV/RV tracing. No evidence of pericardial constriction. - Severly elevated PCWP. - PA sat 51%. CO 6.2 L/min. Cardiac index 2.87. - No significant aortic valve gradient. - There is no aortic valve stenosis.  RHC Procedural Findings: Hemodynamics (mmHg) RA mean 30 RV 87/9 (26) PA 100/56 (72) PCWP 51 AO 142/73 (100) Cardiac Output (Fick) 6.21 Cardiac Index (Fick) 2.87  Past Medical History:  Diagnosis Date  . Aortic stenosis, mild   . Arthritis of hand    "just a little bit in both hands" (03/31/2012)  . Asthma    "little bit" (03/31/2012)  . Atrial fibrillation [facility])    dx '04; DCCV '04, placed on flecainide, failed DCCV 04/2010, flecainide stopped; s/p successful A.fib ablation 01/31/12  . CHF (congestive heart failure) ([place])   . Diabetes mellitus without complication (HCC)    borderline  . DJD (degenerative joint disease)   . Exertional dyspnea   . Fibromyalgia   . First degree AV block    post ablation, ~423ms  . GERD (gastroesophageal reflux disease)   . Heart murmur   . Hyperlipidemia   . Hypertension   . Hypothyroidism    S/P radiation  . Left atrial enlargement    LA size 17mm by echo 11/21/11  . Mitral regurgitation    trivial  . Obesity   . Obstructive sleep apnea    mild by sleep study 2013; pt stated he does not have a machine because "it wasn't bad enough for him to have one"  . Pacemaker 03/31/2012  .  Restless leg syndrome   . Second degree AV block     Current Outpatient Prescriptions  Medication Sig Dispense Refill  . albuterol (PROVENTIL HFA;VENTOLIN HFA) 108 (90 Base) MCG/ACT inhaler Inhale 2 puffs into the lungs every 6 (six) hours as needed for wheezing or shortness of breath. 1 Inhaler 5  . amLODipine (NORVASC) 10 MG tablet Take 0.5 tablets (5 mg total) by mouth daily. 30 tablet 11  . atorvastatin (LIPITOR) 40 MG tablet TAKE 1 TABLET(40 MG) BY MOUTH DAILY 90 tablet 0  . budesonide (PULMICORT) 0.25  MG/2ML nebulizer solution Take 2 mLs (0.25 mg total) by nebulization 2 (two) times daily. (Patient taking differently: Take 0.25 mg by nebulization 2 (two) times daily as needed (for SOB). ) 360 mL 1  . budesonide-formoterol (SYMBICORT) 160-4.5 MCG/ACT inhaler Inhale 2 puffs into the lungs 2 (two) times daily. 1 Inhaler 6  . Cholecalciferol (VITAMIN D) 2000 UNITS CAPS Take 2,000 Units by mouth daily.    . dabigatran (PRADAXA) 150 MG CAPS capsule Take 1 capsule (150 mg total) by mouth 2 (two) times daily. 60 capsule 1  . diphenhydrAMINE (BENADRYL) 25 MG tablet Take 25 mg by mouth at bedtime.    . fexofenadine (ALLEGRA) 180 MG tablet Take 1 tablet (180 mg total) by mouth daily. 30 tablet 1  . fluticasone (FLONASE) 50 MCG/ACT nasal spray Place into both nostrils 2 (two) times daily as needed for allergies or rhinitis.    [name] gabapentin (NEURONTIN) 300 MG capsule Take 900 mg by mouth at bedtime.   3  . guaiFENesin (MUCINEX) 600 MG 12 hr tablet Take 600 mg by mouth 2 (two) times daily.    [name] ipratropium (ATROVENT) 0.02 % nebulizer solution Take 2.5 mLs (0.5 mg total) by nebulization 4 (four) times daily. (Patient taking differently: Take 0.5 mg by nebulization 4 (four) times daily as needed for wheezing or shortness of breath. ) 300 mL 1  . levalbuterol (XOPENEX) 0.63 MG/3ML nebulizer solution Take 3 mLs (0.63 mg total) by nebulization every 6 (six) hours as needed for wheezing or shortness of breath. (Patient taking differently: Take 0.63 mg by nebulization 2 (two) times daily as needed for wheezing or shortness of breath. ) 360 mL 1  . levothyroxine (SYNTHROID, LEVOTHROID) 175 MCG tablet Take 175 mcg by mouth daily before breakfast.    . Melatonin-Pyridoxine (MELATONEX) 3-10 MG TBCR Take 1 tablet by mouth at bedtime.    . metFORMIN (GLUMETZA) 500 MG (MOD) 24 hr tablet Take 500 mg by mouth daily with breakfast.    . metoprolol (LOPRESSOR) 50 MG tablet Take 1 tablet (50 mg total) by mouth 2 (two) times daily.  60 tablet 11  . pantoprazole (PROTONIX) 40 MG tablet Take 40 mg by mouth daily.    . potassium chloride (K-DUR) 10 MEQ tablet Take 2 tablets (20 mEq total) by mouth daily. 60 tablet 6  . promethazine (PHENERGAN) 25 MG tablet Take 25 mg by mouth every 6 (six) hours as needed for nausea or vomiting.    [name] rOPINIRole (REQUIP) 1 MG tablet Take 1 tablet (1 mg total) by mouth daily. 90 tablet 2  . spironolactone (ALDACTONE) 25 MG tablet Take 0.5 tablets (12.5 mg total) by mouth daily. 45 tablet 3  . torsemide (DEMADEX) 20 MG tablet Take 2 tablets (40 mg total) by mouth daily. 60 tablet 6  . traMADol (ULTRAM) 50 MG tablet Take 50 mg by mouth daily. Reported on 10/17/2015    . valACYclovir (VALTREX) 1000 MG tablet  Take 1 tablet by mouth every 12 (twelve) hours as needed (fever blisters). Reported on 10/17/2015     No current facility-administered medications for this encounter.     Allergies  Allergen Reactions  . Meloxicam Rash  . 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 syndrome 09/02/15, resolved with diphenhydramine and slowing of rate      Social History   Social History  . Marital status: Married    Spouse name: N/A  . Number of children: N/A  . Years of education: N/A   Occupational History  . Not on file.   Social History Main Topics  . Smoking status: Former Smoker    Packs/day: 0.50    Years: 15.00    Types: Cigarettes    Quit date: 07/18/1991  . Smokeless tobacco: Never Used  . Alcohol use Yes     Comment: occasional  . Drug use:     Types: Marijuana  . Sexual activity: Not Currently   Other Topics Concern  . Not on file   Social History Narrative   Lives in [place] with spouse.  Works as a Air traffic controller.      Family History  Problem Relation Age of Onset  . Heart failure Mother   . Stroke Father     Vitals:   02/16/16 1059  BP: (!) 154/70  Pulse: 69  SpO2: 100%  Weight: 210 lb 6.4 oz (95.4 kg)    Wt Readings from Last 3 Encounters:    02/16/16 210 lb 6.4 oz (95.4 kg)  02/08/16 205 lb 8 oz (93.2 kg)  02/02/16 218 lb 9.6 oz (99.2 kg)     PHYSICAL EXAM: General:  Well appearing. NAD.  HEENT: Normal. On 02 via [place] Neck: supple. JVP 8-9 cm with mild HJR. Carotids 2+ bilat; no bruits. No thyromegaly or lymphadenopathy noted.   Cor: PMI nondisplaced. RRR. No M/G/R. Lungs: Clear, normal effort Abdomen: soft, NT, ND, no HSM. No bruits or masses. +BS  Umbilical hernia present. Extremities: no cyanosis, clubbing, rash. Trace - 1+ ankle edema.  Neuro: alert & orientedx3, cranial nerves grossly intact. moves all 4 extremities w/o difficulty. Affect pleasant  ASSESSMENT & PLAN:   1. Chronic diastolic CHF with RV failure - Echo 02/02/16 LVEF 60-65%, Grade 3 DD. Mild AI, Mild MR, RV mildly dilated and severely reduced. RA mildly dilated. PA peak pressure 74 mm Hg. Concerns for restrictive cardiomyopathy. - Echo 02/08/16 LVEF 55-60%, Restrictive physiology, (?)RV normal. No assessment of PA pressures. - He is mildly volume overloaded on exam in setting of dietary non compliance.  Weight up 5 lbs from admission and relatively stable there at home.  - Continue torsemide 40 mg daily. Switched from lasix in hospital.  BMET today. - Increase spiro to 25 mg daily.  - Continue amlodipine 5 mg daily. Will not increase with ongoing mild edema. - Continue lopressor 50 mg BID  2. Chronic Afib s/p ablation s/p pacemaker - Rate controlled and V paced 3. AS s/p AVR - Stable bioprosthesis with mild regurgitation on Echo 02/08/16 4. Pulmonary HTN - suspect pulmonary venous hypertension - PA peak pressure 74 mm Hg on Echo 02/02/16. Not measured on repeat echo 02/08/16 5. Mild CAD by cath 01/2016 - Medical management. Continue atorvastatin 40 mg daily 6. Dual chamber PPM  BMET/BNP today.  Increase spiro.  Can take extra 20 mg torsemide as needed if weight goes up.  Stressed importance of limiting sodium. Frequents [place].  Otherwise doing  well.  Can plan repeat RHC in the future as needed.  Currently doing well with improved diuresis.   Shirley Friar, PA-C 02/16/16   Total time spent > 25 minutes. Over half that spent discussing the above.

## 2016-02-16 NOTE — Patient Instructions (Signed)
Increase Spironolactone to 25 mg (1 tab) daily  Labs today  Your physician recommends that you schedule a follow-up appointment in: 2 weeks

## 2016-02-19 ENCOUNTER — Encounter (HOSPITAL_COMMUNITY): Payer: Medicare Other

## 2016-02-21 ENCOUNTER — Encounter (HOSPITAL_COMMUNITY): Payer: Medicare Other

## 2016-02-29 ENCOUNTER — Encounter (HOSPITAL_COMMUNITY): Payer: Self-pay

## 2016-02-29 ENCOUNTER — Ambulatory Visit (HOSPITAL_COMMUNITY)
Admission: RE | Admit: 2016-02-29 | Discharge: 2016-02-29 | Disposition: A | Discharge status: Home / Self Care | Payer: Medicare Other | Source: Ambulatory Visit | Attending: Internal Medicine | Admitting: Internal Medicine

## 2016-02-29 VITALS — BP 136/68 | HR 80 | Wt 213.6 lb

## 2016-02-29 DIAGNOSIS — I481 Persistent atrial fibrillation: Secondary | ICD-10-CM | POA: Diagnosis not present

## 2016-02-29 DIAGNOSIS — E039 Hypothyroidism, unspecified: Secondary | ICD-10-CM | POA: Diagnosis not present

## 2016-02-29 DIAGNOSIS — G2581 Restless legs syndrome: Secondary | ICD-10-CM | POA: Diagnosis not present

## 2016-02-29 DIAGNOSIS — Z9889 Other specified postprocedural states: Secondary | ICD-10-CM | POA: Insufficient documentation

## 2016-02-29 DIAGNOSIS — M797 Fibromyalgia: Secondary | ICD-10-CM | POA: Insufficient documentation

## 2016-02-29 DIAGNOSIS — I10 Essential (primary) hypertension: Secondary | ICD-10-CM

## 2016-02-29 DIAGNOSIS — I272 Pulmonary hypertension, unspecified: Secondary | ICD-10-CM

## 2016-02-29 DIAGNOSIS — Z888 Allergy status to other drugs, medicaments and biological substances status: Secondary | ICD-10-CM | POA: Insufficient documentation

## 2016-02-29 DIAGNOSIS — I350 Nonrheumatic aortic (valve) stenosis: Secondary | ICD-10-CM

## 2016-02-29 DIAGNOSIS — E785 Hyperlipidemia, unspecified: Secondary | ICD-10-CM | POA: Diagnosis not present

## 2016-02-29 DIAGNOSIS — I4819 Other persistent atrial fibrillation: Secondary | ICD-10-CM

## 2016-02-29 DIAGNOSIS — K219 Gastro-esophageal reflux disease without esophagitis: Secondary | ICD-10-CM | POA: Insufficient documentation

## 2016-02-29 DIAGNOSIS — I110 Hypertensive heart disease with heart failure: Secondary | ICD-10-CM | POA: Diagnosis not present

## 2016-02-29 DIAGNOSIS — Z87891 Personal history of nicotine dependence: Secondary | ICD-10-CM | POA: Insufficient documentation

## 2016-02-29 DIAGNOSIS — I2510 Atherosclerotic heart disease of native coronary artery without angina pectoris: Secondary | ICD-10-CM | POA: Insufficient documentation

## 2016-02-29 DIAGNOSIS — Z9981 Dependence on supplemental oxygen: Secondary | ICD-10-CM | POA: Insufficient documentation

## 2016-02-29 DIAGNOSIS — G4733 Obstructive sleep apnea (adult) (pediatric): Secondary | ICD-10-CM | POA: Diagnosis not present

## 2016-02-29 DIAGNOSIS — E669 Obesity, unspecified: Secondary | ICD-10-CM | POA: Insufficient documentation

## 2016-02-29 DIAGNOSIS — I340 Nonrheumatic mitral (valve) insufficiency: Secondary | ICD-10-CM | POA: Diagnosis not present

## 2016-02-29 DIAGNOSIS — Z7901 Long term (current) use of anticoagulants: Secondary | ICD-10-CM | POA: Diagnosis not present

## 2016-02-29 DIAGNOSIS — Z8249 Family history of ischemic heart disease and other diseases of the circulatory system: Secondary | ICD-10-CM | POA: Insufficient documentation

## 2016-02-29 DIAGNOSIS — Z950 Presence of cardiac pacemaker: Secondary | ICD-10-CM

## 2016-02-29 DIAGNOSIS — Z6829 Body mass index (BMI) 29.0-29.9, adult: Secondary | ICD-10-CM | POA: Diagnosis not present

## 2016-02-29 DIAGNOSIS — Z953 Presence of xenogenic heart valve: Secondary | ICD-10-CM

## 2016-02-29 DIAGNOSIS — Z823 Family history of stroke: Secondary | ICD-10-CM | POA: Insufficient documentation

## 2016-02-29 DIAGNOSIS — I482 Chronic atrial fibrillation: Secondary | ICD-10-CM | POA: Insufficient documentation

## 2016-02-29 DIAGNOSIS — Z881 Allergy status to other antibiotic agents status: Secondary | ICD-10-CM | POA: Diagnosis not present

## 2016-02-29 DIAGNOSIS — I5032 Chronic diastolic (congestive) heart failure: Secondary | ICD-10-CM | POA: Insufficient documentation

## 2016-02-29 DIAGNOSIS — E119 Type 2 diabetes mellitus without complications: Secondary | ICD-10-CM | POA: Insufficient documentation

## 2016-02-29 DIAGNOSIS — Z7984 Long term (current) use of oral hypoglycemic drugs: Secondary | ICD-10-CM | POA: Insufficient documentation

## 2016-02-29 DIAGNOSIS — I2721 Secondary pulmonary arterial hypertension: Secondary | ICD-10-CM | POA: Diagnosis not present

## 2016-02-29 LAB — BASIC METABOLIC PANEL
Anion gap: 9 (ref 5–15)
BUN: 23 mg/dL — ABNORMAL HIGH (ref 6–20)
CO2: 28 mmol/L (ref 22–32)
Calcium: 9.7 mg/dL (ref 8.9–10.3)
Chloride: 98 mmol/L — ABNORMAL LOW (ref 101–111)
Creatinine, Ser: 1.56 mg/dL — ABNORMAL HIGH (ref 0.61–1.24)
GFR calc Af Amer: 52 mL/min — ABNORMAL LOW (ref >60)
GFR calc non Af Amer: 45 mL/min — ABNORMAL LOW (ref >60)
Glucose, Bld: 118 mg/dL — ABNORMAL HIGH (ref 65–99)
Potassium: 4.2 mmol/L (ref 3.5–5.1)
Sodium: 135 mmol/L (ref 135–145)

## 2016-02-29 LAB — BRAIN NATRIURETIC PEPTIDE: B Natriuretic Peptide: 181.9 pg/mL — ABNORMAL HIGH (ref 0.0–100.0)

## 2016-02-29 NOTE — Progress Notes (Signed)
Advanced Heart Failure Clinic Note   Primary Care: Velna Hatchet, MD Primary Cardiologist: Dr. Acie Fredrickson Primary HF: Dr. Haroldine Laws   HPI:  [name] is a 65 y.o. male with history of HTN, Afib s/p ablation s/p Pacer, Normal cors by cath in 2014, and Aortic Stenosis s/p AVR + MAZE.   Admitted from Dr. Acie Fredrickson office 02/02/16 with severe DOE with any exercise or walking 10-20 feet. Pt admitted and planned for [facility] 02/05/16. Diuresed with IV lasix with rapid improvement of symptoms. Overall diuresed 15.2 L and down 14 lbs. Discharge weight was 205 lbs.  Echo 02/02/16 LVEF 60-65%, Grade 3 DD. Mild AI, Mild MR, RV mildly dilated and severely reduced. RA mildly dilated. PA peak pressure 74 mm Hg. Concerns for restrictive cardiomyopathy.  L/RHC 02/05/16 with no significant CAD. RHC showed significant difference between PCWP (50) and LVEDP (30) suggestive of MS but no MS on Echo. Also showed significant RV dysfunction. Suspected to possibly be artifactual.     Echo 02/08/16 LVEF 55-60%, Restrictive physiology, (?)RV normal. No assessment of PA pressures.  He presents today for regular follow up. Weight up 3 lbs from last visit. Weight at home 203-205, stable from discharge.  Breathing has been fine at home.  Wears 02 2L pulses. Hasn't had any problems walking on flat ground, stairs, or a hill since discharge.  Peeing a lot on torsemide. No orthopnea, though sleeps in a fully reclined recliner chronically. Occasionally dizzy with rapid standing, especially if he is not wearing his 02. Watching fluid and salt better now, no more [place]. Having trouble sleeping.   L/RHC 02/05/16 - Mid RCA lesion, 10 %stenosed. - Ost Cx to Prox Cx lesion, 20 %stenosed. - LV end diastolic pressure is moderately elevated. - Hemodynamic findings consistent with severe pulmonary hypertension. - No evidence of discordance on the LV/RV tracing. No evidence of pericardial constriction. - Severly elevated PCWP. -  PA sat 51%. CO 6.2 L/min. Cardiac index 2.87. - No significant aortic valve gradient. - There is no aortic valve stenosis.  RHC Procedural Findings: Hemodynamics (mmHg) RA mean 30 RV 87/9 (26) PA 100/56 (72) PCWP 51 AO 142/73 (100) Cardiac Output (Fick) 6.21 Cardiac Index (Fick) 2.87  Past Medical History:  Diagnosis Date  . Aortic stenosis, mild   . Arthritis of hand    "just a little bit in both hands" (03/31/2012)  . Asthma    "little bit" (03/31/2012)  . Atrial fibrillation [facility])    dx '04; DCCV '04, placed on flecainide, failed DCCV 04/2010, flecainide stopped; s/p successful A.fib ablation 01/31/12  . CHF (congestive heart failure) ([place])   . Diabetes mellitus without complication (HCC)    borderline  . DJD (degenerative joint disease)   . Exertional dyspnea   . Fibromyalgia   . First degree AV block    post ablation, ~430ms  . GERD (gastroesophageal reflux disease)   . Heart murmur   . Hyperlipidemia   . Hypertension   . Hypothyroidism    S/P radiation  . Left atrial enlargement    LA size 74mm by echo 11/21/11  . Mitral regurgitation    trivial  . Obesity   . Obstructive sleep apnea    mild by sleep study 2013; pt stated he does not have a machine because "it wasn't bad enough for him to have one"  . Pacemaker 03/31/2012  . Restless leg syndrome   . Second degree AV block     Current Outpatient Prescriptions  Medication Sig Dispense  Refill  . albuterol (PROVENTIL HFA;VENTOLIN HFA) 108 (90 Base) MCG/ACT inhaler Inhale 2 puffs into the lungs every 6 (six) hours as needed for wheezing or shortness of breath. 1 Inhaler 5  . amLODipine (NORVASC) 10 MG tablet Take 0.5 tablets (5 mg total) by mouth daily. 30 tablet 11  . atorvastatin (LIPITOR) 40 MG tablet TAKE 1 TABLET(40 MG) BY MOUTH DAILY 90 tablet 0  . budesonide (PULMICORT) 0.25 MG/2ML nebulizer solution Take 2 mLs (0.25 mg total) by nebulization 2 (two) times daily. (Patient taking differently: Take 0.25 mg by  nebulization 2 (two) times daily as needed (for SOB). ) 360 mL 1  . budesonide-formoterol (SYMBICORT) 160-4.5 MCG/ACT inhaler Inhale 2 puffs into the lungs 2 (two) times daily. 1 Inhaler 6  . Cholecalciferol (VITAMIN D) 2000 UNITS CAPS Take 2,000 Units by mouth daily.    . dabigatran (PRADAXA) 150 MG CAPS capsule Take 1 capsule (150 mg total) by mouth 2 (two) times daily. 60 capsule 1  . diphenhydrAMINE (BENADRYL) 25 MG tablet Take 25 mg by mouth at bedtime.    . fexofenadine (ALLEGRA) 180 MG tablet Take 1 tablet (180 mg total) by mouth daily. 30 tablet 1  . fluticasone (FLONASE) 50 MCG/ACT nasal spray Place into both nostrils 2 (two) times daily as needed for allergies or rhinitis.    [name] gabapentin (NEURONTIN) 300 MG capsule Take 900 mg by mouth at bedtime.   3  . guaiFENesin (MUCINEX) 600 MG 12 hr tablet Take 600 mg by mouth 2 (two) times daily.    [name] ipratropium (ATROVENT) 0.02 % nebulizer solution Take 2.5 mLs (0.5 mg total) by nebulization 4 (four) times daily. (Patient taking differently: Take 0.5 mg by nebulization 4 (four) times daily as needed for wheezing or shortness of breath. ) 300 mL 1  . levalbuterol (XOPENEX) 0.63 MG/3ML nebulizer solution Take 3 mLs (0.63 mg total) by nebulization every 6 (six) hours as needed for wheezing or shortness of breath. (Patient taking differently: Take 0.63 mg by nebulization 2 (two) times daily as needed for wheezing or shortness of breath. ) 360 mL 1  . levothyroxine (SYNTHROID, LEVOTHROID) 175 MCG tablet Take 175 mcg by mouth daily before breakfast.    . Melatonin-Pyridoxine (MELATONEX) 3-10 MG TBCR Take 1 tablet by mouth at bedtime.    . metFORMIN (GLUMETZA) 500 MG (MOD) 24 hr tablet Take 500 mg by mouth daily with breakfast.    . metoprolol (LOPRESSOR) 50 MG tablet Take 1 tablet (50 mg total) by mouth 2 (two) times daily. 60 tablet 11  . pantoprazole (PROTONIX) 40 MG tablet Take 40 mg by mouth daily.    . potassium chloride (K-DUR) 10 MEQ tablet Take 2  tablets (20 mEq total) by mouth daily. 60 tablet 6  . promethazine (PHENERGAN) 25 MG tablet Take 25 mg by mouth every 6 (six) hours as needed for nausea or vomiting.    [name] rOPINIRole (REQUIP) 1 MG tablet Take 1 tablet (1 mg total) by mouth daily. 90 tablet 2  . spironolactone (ALDACTONE) 25 MG tablet Take 1 tablet (25 mg total) by mouth daily. 90 tablet 3  . torsemide (DEMADEX) 20 MG tablet Take 2 tablets (40 mg total) by mouth daily. 60 tablet 6  . traMADol (ULTRAM) 50 MG tablet Take 50 mg by mouth daily. Reported on 10/17/2015    . valACYclovir (VALTREX) 1000 MG tablet Take 1 tablet by mouth every 12 (twelve) hours as needed (fever blisters). Reported on 10/17/2015     No  current facility-administered medications for this encounter.     Allergies  Allergen Reactions  . Meloxicam Rash  . 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 syndrome 09/02/15, resolved with diphenhydramine and slowing of rate      Social History   Social History  . Marital status: Married    Spouse name: N/A  . Number of children: N/A  . Years of education: N/A   Occupational History  . Not on file.   Social History Main Topics  . Smoking status: Former Smoker    Packs/day: 0.50    Years: 15.00    Types: Cigarettes    Quit date: 07/18/1991  . Smokeless tobacco: Never Used  . Alcohol use Yes     Comment: occasional  . Drug use:     Types: Marijuana  . Sexual activity: Not Currently   Other Topics Concern  . Not on file   Social History Narrative   Lives in [place] with spouse.  Works as a Air traffic controller.    Family History  Problem Relation Age of Onset  . Heart failure Mother   . Stroke Father     Vitals:   02/29/16 1047  BP: 136/68  Pulse: 80  SpO2: 96%  Weight: 213 lb 9.6 oz (96.9 kg)    Wt Readings from Last 3 Encounters:  02/29/16 213 lb 9.6 oz (96.9 kg)  02/16/16 210 lb 6.4 oz (95.4 kg)  02/08/16 205 lb 8 oz (93.2 kg)     PHYSICAL EXAM: General:  Well appearing.  NAD.  HEENT: Normal. On 02 via [place] Neck: supple. JVP 7-8 cm with mild HJR. Carotids 2+ bilat; no bruits. No thyromegaly or nodule noted.    Cor: PMI nondisplaced. RRR. No murmurs, gallops, or rubs. Lungs: CTAB, normal effort. Abdomen: soft, NT, ND, no HSM. No bruits or masses. +BS  Umbilical hernia present. Extremities: no cyanosis, clubbing, rash. Trace ankle edema at most. Neuro: alert & orientedx3, cranial nerves grossly intact. moves all 4 extremities w/o difficulty. Affect pleasant  ASSESSMENT & PLAN:   1. Chronic diastolic CHF with RV failure - Echo 02/02/16 LVEF 60-65%, Grade 3 DD. Mild AI, Mild MR, RV mildly dilated and severely reduced. RA mildly dilated. PA peak pressure 74 mm Hg. Concerns for restrictive cardiomyopathy. - Echo 02/08/16 LVEF 55-60%, Restrictive physiology, (?)RV normal. No assessment of PA pressures. - Volume status looks stable.   - Continue torsemide 40 mg daily. Can take an extra  20 mg as needed for weight gain. BMET today.  - Continue spiro 25 mg daily.  - Continue amlodipine 5 mg daily. - Continue lopressor 50 mg BID  2. Chronic Afib s/p ablation s/p pacemaker - Rate controlled and V paced 3. AS s/p AVR - Stable bioprosthesis with mild regurgitation on Echo 02/08/16 4. Pulmonary HTN - suspect pulmonary venous hypertension - PA peak pressure 74 mm Hg on Echo 02/02/16. Not measured on repeat echo 02/08/16 5. Mild CAD by cath 01/2016 - Medical management. Continue atorvastatin 40 mg daily - No CP. 6. Dual chamber PPM  Doing well, and continues to feel better. Weight up slightly, but appetite has improved.  Has taken 1 extra torsemide since last visit. Reviewed sliding scale diuretics and low salt diet.   Can plan repeat RHC in the future as needed.  Currently doing well with improved diuresis. Functional status much improved compared to prior to recent admission.   Follow up with MD 4-6 weeks.  Shirley Friar, PA-C  02/29/16   

## 2016-02-29 NOTE — Patient Instructions (Signed)
Routine lab work today. Will notify you of abnormal results, otherwise no news is good news!  Follow up 4-6 weeks with Dr. Bensimhon.  Do the following things EVERYDAY: 1) Weigh yourself in the morning before breakfast. Write it down and keep it in a log. 2) Take your medicines as prescribed 3) Eat low salt foods-Limit salt (sodium) to 2000 mg per day.  4) Stay as active as you can everyday 5) Limit all fluids for the day to less than 2 liters 

## 2016-03-07 ENCOUNTER — Ambulatory Visit (INDEPENDENT_AMBULATORY_CARE_PROVIDER_SITE_OTHER): Payer: Medicare Other | Admitting: Emergency Medicine

## 2016-03-07 ENCOUNTER — Encounter: Payer: Self-pay | Admitting: Emergency Medicine

## 2016-03-07 DIAGNOSIS — J45909 Unspecified asthma, uncomplicated: Secondary | ICD-10-CM | POA: Diagnosis not present

## 2016-03-07 NOTE — Patient Instructions (Signed)
Please continue Symbicort twice a day as you have been taking it. Rinse and gargle to clear your mouth after using. Take albuterol 2 puffs up to every 4 hours if needed for shortness of breath.  Continue oxygen with exertion. Our for is for your oxygen saturations to be above 90% Flu shot up-to-date Follow with Dr Lamonte Sakai in 6 months or sooner if you have any problems

## 2016-03-07 NOTE — Progress Notes (Signed)
Subjective:    Patient ID: [name], male    DOB: 05/09/1950, 65 y.o.   MRN: [id number]  Asthma  He complains of cough and shortness of breath. There is no wheezing. His past medical history is significant for asthma.   65 yo man, former smoker (7-8pk-yrs), hx obesity, HTN, mild AS, A Fib s/p ablation and rate control, mild OSA (by PSG '13), asthma dx several yrs ago by Dr Minna Antis. Has been experiencing exertional dyspnea for 2-3 yrs. He notices dyspnea with climbing a hill. He snores, moves his arms and legs continuously through the night. He has lost 20 lbs over last few months which has helped some.   Eval has included PFT that show mixed disease with a definite obstructive component based on a response to bronchodilator 08/05/13. Diffusion corrected for alveolar volume. CPST and L/R caths as below.   ROV 09/20/14 -- seen by Dr Gwenette Greet in 4/'16 and then TP in early May. At that time was treated for bronchitis w doxy. He is on symbicort bid, uses SABA about 5x a day. He is on fluticasone and allegra. His largest persistent sx are exertional dyspnea. He has mild OSA by PSG 2013. He is having frequent cough, bad at night. Significant nasal congestion. Over the 3 months he has had early satiety, stable wt.   11/06/15 -- -Mr. [name] is a 65 year old with past medical history of asthma and CAD. Quit smoking in 1993.  He is here for urgent follow up.  He had valve surgery in April 2017. OR went well but had SOB shortly after and they found fluid in lungs. Was diuresced and got better. Not back at baseline though.  He has been SOB x 5 weeks. Got ZPAK and Pred end of June. He finished his prednisone a week ago.  Over all, he feels some better but is still SOB with ADLs, not as bad as 5 weeks ago. He feels prednsione did NOT make a whole lot of difference.  Not on O2.  Has snoring. No recent sleep study.  His weight has been the same.  He saw cardiologist today and was told he has wheezing and  needed pulm f/u.   ROV 11/13/15 --   65 year old man. Minimal tobacco history, COPD based on this and also probable fixed asthma. He has frequent bouts of bronchitic symptoms superimposed on his chronic cough. Also with a history of coronary disease, atrial fibrillation status post ablation, and an aortic valve replacement for aortic stenosis with Maze procedure and left atrial clipping. He has LVH with ejection fraction of approximately 50%. He has been treated with antibiotics and prednisone since his hospitalization for the valve replacement. His biggest problems have been persistent cough, wheezing and dyspnea. He is unable to lay flat, sleeps in a recliner. He has been recently seen by Dr. Murlean Iba, underwent Ct chest 7/11 that showed scattered diffuse GGI and a small right effusion, treated with an increase in his Lasix to 80 mg a day for 5 days. He has also seen thoracic surgery and was treated with Augmentin and Tussionex. His bronchodilator regimen includes Atrovent nebulizers 4 times a day, Pulmicort nebulizer 2 times a day, Xopenex nebulizer when necessary which he uses rarely. He states that his cough and mucus have improved significantly as has his dyspnea.   ROV 12/06/15 -- 65 year old gentleman with a history of COPD/asthma also with significant cardiac disease that includes aortic valve replacement, atrial fibrillation post Maze procedure. Secondary pulmonary hypertension. He  had been experiencing progressive dyspnea And was hospitalized 7/27 for DC cardioversion from atrial fibrillation. He had stay in the hospital for aggressive diuresis.  His breathing improved with these maneuvers. He has a lot of chest congestion, a lot of cough, worst at night when he sleeps in a recliner. He is using symbicort bid, on allegra qd, flonase prn. protonix 40mg  - was formerly on bid, currently on qd. No evidence that the symbicort is contributing to the cough.   ROV 03/07/16 -- this follow-up visit for  multifactorial hypoxemic respiratory failure. He has COPD asthma, significant cardiac disease including AVR, atrial fibrillation post Maze procedure and secondary pulmonary hypertension. He required DC cardioversion and aggressive diuresis in July 2017. He was hospitalized again in October 2017, treated again for diuresis. A left and right heart cath was performed on 02/05/16 as detailed below. Note high PAOP prior to diuresis. Was started on O2 w exertion prior to the last admission. Notes that his SpO2 is at goal w rest, does note desats w exertion. He is on symbicort bid. Uses albuterol very rarely.    L/RHC 02/05/16 - Mid RCA lesion, 10 %stenosed. - Ost Cx to Prox Cx lesion, 20 %stenosed. - LV end diastolic pressure is moderately elevated. - Hemodynamic findings consistent with severe pulmonary hypertension. - No evidence of discordance on the LV/RV tracing. No evidence of pericardial constriction. - Severly elevated PCWP. - PA sat 51%. CO 6.2 L/min. Cardiac index 2.87. - No significant aortic valve gradient. - There is no aortic valve stenosis.  RHC Procedural Findings: Hemodynamics (mmHg) RA mean 30 RV 87/9 (26) PA 100/56 (72) PCWP 51 AO 142/73 (100) Cardiac Output (Fick) 6.21 Cardiac Index (Fick) 2.87    Review of Systems  Constitutional: Negative.   HENT: Positive for congestion.   Respiratory: Positive for cough and shortness of breath. Negative for chest tightness and wheezing.   Cardiovascular: Negative.   Gastrointestinal: Negative.   Endocrine: Negative.   Genitourinary: Negative.   Musculoskeletal: Negative.   Allergic/Immunologic: Negative.   Hematological: Negative.   All other systems reviewed and are negative.      Objective:   Physical Exam  Vitals:  Vitals:   03/07/16 1106  BP: 126/60  Pulse: 80  SpO2: 98%  Weight: 98.9 kg (218 lb)  Height: 5\' 11"  (1.803 m)    Body mass index is 30.4 kg/m. Wt Readings from Last 3 Encounters:  03/07/16  98.9 kg (218 lb)  02/29/16 96.9 kg (213 lb 9.6 oz)  02/16/16 95.4 kg (210 lb 6.4 oz)   Gen: Pleasant, well-nourished, in no distress,  normal affect, O2 in place  ENT: No lesions,  mouth clear,  oropharynx clear, no postnasal drip  Neck: No JVD, no TMG, no carotid bruits  Lungs: No use of accessory muscles, clear without rales or rhonchi  Cardiovascular: RRR, heart sounds normal, no murmur or gallops, no peripheral edema  Musculoskeletal: No deformities, no cyanosis or clubbing  Neuro: alert, non focal  Skin: Warm, no lesions or rashes       Assessment & Plan:  Intrinsic asthma Please continue Symbicort twice a day as you have been taking it. Rinse and gargle to clear your mouth after using. Take albuterol 2 puffs up to every 4 hours if needed for shortness of breath.  Continue oxygen with exertion. Our for is for your oxygen saturations to be above 90% Flu shot up-to-date Follow with Dr Lamonte Sakai in 6 months or sooner if you have any problems  Baltazar Apo, MD, PhD 03/07/2016, 11:32 AM [facility] [phone] or if no answer [phone]

## 2016-03-07 NOTE — Assessment & Plan Note (Signed)
Please continue Symbicort twice a day as you have been taking it. Rinse and gargle to clear your mouth after using. Take albuterol 2 puffs up to every 4 hours if needed for shortness of breath.  Continue oxygen with exertion. Our for is for your oxygen saturations to be above 90% Flu shot up-to-date Follow with Dr Lamonte Sakai in 6 months or sooner if you have any problems

## 2016-03-15 ENCOUNTER — Telehealth: Payer: Self-pay | Admitting: Cardiovascular Disease

## 2016-03-15 NOTE — Telephone Encounter (Signed)
Spoke with patients wife and made her aware that there are no samples available at this time. She stated that the patient still has a few and she will call back at a different time to see if we have received any.

## 2016-03-15 NOTE — Telephone Encounter (Signed)
New message    Wife calling     Patient calling the office for samples of medication:   1.  What medication and dosage are you requesting samples for?dabigatran (PRADAXA) 150 MG CAPS capsule  2.  Are you currently out of this medication?  Yes

## 2016-03-18 ENCOUNTER — Ambulatory Visit: Payer: Medicare Other | Admitting: Cardiovascular Disease

## 2016-03-27 ENCOUNTER — Telehealth: Payer: Self-pay | Admitting: Cardiovascular Disease

## 2016-03-27 NOTE — Telephone Encounter (Signed)
Patient calling the office for samples of medication:   1.  What medication and dosage are you requesting samples for? prodaxa 150mg  2xday  2.  Are you currently out of this medication? yes

## 2016-03-27 NOTE — Telephone Encounter (Signed)
Patients wife aware that no samples are available. She will call again later in the week or next week to see if we have received any.

## 2016-03-29 ENCOUNTER — Telehealth: Payer: Self-pay | Admitting: Cardiovascular Disease

## 2016-03-29 ENCOUNTER — Telehealth (HOSPITAL_COMMUNITY): Payer: Self-pay

## 2016-03-29 NOTE — Telephone Encounter (Signed)
Wife calling CHF clinic triage to inquire about pradaxa samples.  States patient will run out right at the new year and is facing a $500 deductible at that time that they state they cannot afford and unsure what to do. Will forward to CHF clinical pharmacist Doroteo Bradford to see if there is any assistance available for this patient's situation as there are no samples at this office.  Renee Pain, RN

## 2016-03-29 NOTE — Telephone Encounter (Signed)
Patient calling the office for samples of medication:   1.  What medication and dosage are you requesting samples for? Pradaxa  2.  Are you currently out of this medication? No,just a couple of tablets left

## 2016-03-29 NOTE — Telephone Encounter (Signed)
Called pt and spoke with pt's wife to inform her that we did not have any samples of Pradaxa and that they could call back next week to see if we have gotten any in. Wife verbalized understanding.

## 2016-04-04 ENCOUNTER — Encounter (HOSPITAL_COMMUNITY): Payer: Self-pay | Admitting: *Deleted

## 2016-04-04 ENCOUNTER — Ambulatory Visit (HOSPITAL_COMMUNITY)
Admission: RE | Admit: 2016-04-04 | Discharge: 2016-04-04 | Disposition: A | Discharge status: Home / Self Care | Payer: Medicare Other | Source: Ambulatory Visit | Attending: Internal Medicine | Admitting: Internal Medicine

## 2016-04-04 ENCOUNTER — Encounter (HOSPITAL_COMMUNITY): Payer: Self-pay | Admitting: Internal Medicine

## 2016-04-04 ENCOUNTER — Telehealth (HOSPITAL_COMMUNITY): Payer: Self-pay | Admitting: *Deleted

## 2016-04-04 ENCOUNTER — Ambulatory Visit (INDEPENDENT_AMBULATORY_CARE_PROVIDER_SITE_OTHER): Payer: Medicare Other | Admitting: *Deleted

## 2016-04-04 ENCOUNTER — Telehealth (HOSPITAL_COMMUNITY): Payer: Self-pay | Admitting: Pharmacist

## 2016-04-04 VITALS — BP 128/56 | HR 70 | Wt 214.1 lb

## 2016-04-04 DIAGNOSIS — I5022 Chronic systolic (congestive) heart failure: Secondary | ICD-10-CM | POA: Diagnosis not present

## 2016-04-04 DIAGNOSIS — I272 Pulmonary hypertension, unspecified: Secondary | ICD-10-CM | POA: Diagnosis not present

## 2016-04-04 DIAGNOSIS — G2581 Restless legs syndrome: Secondary | ICD-10-CM | POA: Diagnosis not present

## 2016-04-04 DIAGNOSIS — J9610 Chronic respiratory failure, unspecified whether with hypoxia or hypercapnia: Secondary | ICD-10-CM | POA: Insufficient documentation

## 2016-04-04 DIAGNOSIS — G4733 Obstructive sleep apnea (adult) (pediatric): Secondary | ICD-10-CM | POA: Insufficient documentation

## 2016-04-04 DIAGNOSIS — Z7984 Long term (current) use of oral hypoglycemic drugs: Secondary | ICD-10-CM | POA: Insufficient documentation

## 2016-04-04 DIAGNOSIS — E669 Obesity, unspecified: Secondary | ICD-10-CM | POA: Insufficient documentation

## 2016-04-04 DIAGNOSIS — I5032 Chronic diastolic (congestive) heart failure: Secondary | ICD-10-CM | POA: Diagnosis present

## 2016-04-04 DIAGNOSIS — Z87891 Personal history of nicotine dependence: Secondary | ICD-10-CM | POA: Insufficient documentation

## 2016-04-04 DIAGNOSIS — I2510 Atherosclerotic heart disease of native coronary artery without angina pectoris: Secondary | ICD-10-CM | POA: Insufficient documentation

## 2016-04-04 DIAGNOSIS — I509 Heart failure, unspecified: Secondary | ICD-10-CM | POA: Diagnosis not present

## 2016-04-04 DIAGNOSIS — J449 Chronic obstructive pulmonary disease, unspecified: Secondary | ICD-10-CM | POA: Insufficient documentation

## 2016-04-04 DIAGNOSIS — I480 Paroxysmal atrial fibrillation: Secondary | ICD-10-CM | POA: Diagnosis not present

## 2016-04-04 DIAGNOSIS — Z888 Allergy status to other drugs, medicaments and biological substances status: Secondary | ICD-10-CM | POA: Insufficient documentation

## 2016-04-04 DIAGNOSIS — M797 Fibromyalgia: Secondary | ICD-10-CM | POA: Diagnosis not present

## 2016-04-04 DIAGNOSIS — Z8249 Family history of ischemic heart disease and other diseases of the circulatory system: Secondary | ICD-10-CM | POA: Insufficient documentation

## 2016-04-04 DIAGNOSIS — Z823 Family history of stroke: Secondary | ICD-10-CM | POA: Insufficient documentation

## 2016-04-04 DIAGNOSIS — E785 Hyperlipidemia, unspecified: Secondary | ICD-10-CM | POA: Diagnosis not present

## 2016-04-04 DIAGNOSIS — I350 Nonrheumatic aortic (valve) stenosis: Secondary | ICD-10-CM | POA: Insufficient documentation

## 2016-04-04 DIAGNOSIS — Z923 Personal history of irradiation: Secondary | ICD-10-CM | POA: Insufficient documentation

## 2016-04-04 DIAGNOSIS — E039 Hypothyroidism, unspecified: Secondary | ICD-10-CM | POA: Diagnosis not present

## 2016-04-04 DIAGNOSIS — Z9889 Other specified postprocedural states: Secondary | ICD-10-CM | POA: Diagnosis not present

## 2016-04-04 DIAGNOSIS — I340 Nonrheumatic mitral (valve) insufficiency: Secondary | ICD-10-CM | POA: Insufficient documentation

## 2016-04-04 DIAGNOSIS — I110 Hypertensive heart disease with heart failure: Secondary | ICD-10-CM | POA: Insufficient documentation

## 2016-04-04 DIAGNOSIS — K219 Gastro-esophageal reflux disease without esophagitis: Secondary | ICD-10-CM | POA: Diagnosis not present

## 2016-04-04 DIAGNOSIS — Z7901 Long term (current) use of anticoagulants: Secondary | ICD-10-CM | POA: Insufficient documentation

## 2016-04-04 DIAGNOSIS — E119 Type 2 diabetes mellitus without complications: Secondary | ICD-10-CM | POA: Diagnosis not present

## 2016-04-04 DIAGNOSIS — I441 Atrioventricular block, second degree: Secondary | ICD-10-CM

## 2016-04-04 DIAGNOSIS — M069 Rheumatoid arthritis, unspecified: Secondary | ICD-10-CM | POA: Insufficient documentation

## 2016-04-04 DIAGNOSIS — Z950 Presence of cardiac pacemaker: Secondary | ICD-10-CM | POA: Insufficient documentation

## 2016-04-04 DIAGNOSIS — Z881 Allergy status to other antibiotic agents status: Secondary | ICD-10-CM | POA: Insufficient documentation

## 2016-04-04 MED ORDER — APIXABAN 5 MG PO TABS: 5.0000 mg | ORAL_TABLET | Freq: Two times a day (BID) | ORAL | 3 refills | Status: DC

## 2016-04-04 NOTE — Patient Instructions (Addendum)
You have been scheduled for a Cardioversion on Monday December 11th, please see attached instructions.  Pulmonary Rehab has been ordered for you, they will contact you to for scheduling  Stop taking Pradaxa and START taking Eliquis 5mg  (1 Tab) Two times daily  Follow up in 3 months

## 2016-04-04 NOTE — Telephone Encounter (Signed)
Called and left message letting patient know that he has an appointment with Dr. Rayann Heman on December 14th at 12:30 at [address] location.

## 2016-04-04 NOTE — Progress Notes (Signed)
Advanced Heart Failure Clinic Note   Primary Care: Velna Hatchet, MD Primary Cardiologist: Dr. Acie Fredrickson Primary HF: Dr. Haroldine Laws   HPI:  [name] is a 65 y.o. 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   Admitted from Dr. Acie Fredrickson office 02/02/16 with severe DOE with any exercise or walking 10-20 feet. Pt admitted and planned for [facility] 02/05/16. Diuresed with IV lasix with rapid improvement of symptoms. Overall diuresed 15.2 L and down 14 lbs. Discharge weight was 205 lbs.  Echo 02/02/16 LVEF 60-65%, Grade 3 DD. Mild AI, Mild MR, RV mildly dilated and severely reduced. RA mildly dilated. PA peak pressure 74 mm Hg. Concerns for restrictive cardiomyopathy.  L/RHC 02/05/16 with no significant CAD. RHC showed significant difference between PCWP (50) and LVEDP (30) suggestive of MS but no MS on Echo. Weight at d/c was 205 pounds.  Echo 02/08/16 LVEF 55-60%, Restrictive physiology, (?)RV normal. No assessment of PA pressures.  He presents today for regular follow up. Says he has been felling rough lately. More SOB. Legs hurt. Weight at home stable 205-206. Legs hurt mainly in thighs. Hurts at rest. Better with walking. Can do ADLs and goes to the store but gives out easily. Sleeps in recliner. No edema.    L/RHC 02/05/16 - Mid RCA lesion, 10 %stenosed. - Ost Cx to Prox Cx lesion, 20 %stenosed. - LV end diastolic pressure is moderately elevated. - Hemodynamic findings consistent with severe pulmonary hypertension. - No evidence of discordance on the LV/RV tracing. No evidence of pericardial constriction. - Severly elevated PCWP. - PA sat 51%. CO 6.2 L/min. Cardiac index 2.87. - No significant aortic valve gradient. - There is no aortic valve stenosis.  RHC Procedural Findings: Hemodynamics (mmHg) RA mean 30 RV 87/9 (26) PA 100/56 (72) PCWP 51 LVEDP  31 AO 142/73 (100) Cardiac Output (Fick) 6.21 Cardiac Index (Fick) 2.87 PVR = 3.5 WU    PFTs 4/17  FEV1  1.6 (47%) FVC 2.25 (49%) DLCO 54%   Past Medical History:  Diagnosis Date  . Aortic stenosis, mild   . Arthritis of hand    "just a little bit in both hands" (03/31/2012)  . Asthma    "little bit" (03/31/2012)  . Atrial fibrillation [facility])    dx '04; DCCV '04, placed on flecainide, failed DCCV 04/2010, flecainide stopped; s/p successful A.fib ablation 01/31/12  . CHF (congestive heart failure) ([place])   . Diabetes mellitus without complication (HCC)    borderline  . DJD (degenerative joint disease)   . Exertional dyspnea   . Fibromyalgia   . First degree AV block    post ablation, ~454ms  . GERD (gastroesophageal reflux disease)   . Heart murmur   . Hyperlipidemia   . Hypertension   . Hypothyroidism    S/P radiation  . Left atrial enlargement    LA size 71mm by echo 11/21/11  . Mitral regurgitation    trivial  . Obesity   . Obstructive sleep apnea    mild by sleep study 2013; pt stated he does not have a machine because "it wasn't bad enough for him to have one"  . Pacemaker 03/31/2012  . Restless leg syndrome   . Second degree AV block     Current Outpatient Prescriptions  Medication Sig Dispense Refill  . albuterol (PROVENTIL HFA;VENTOLIN HFA) 108 (90 Base) MCG/ACT inhaler  Inhale 2 puffs into the lungs every 6 (six) hours as needed for wheezing or shortness of breath. 1 Inhaler 5  . amLODipine (NORVASC) 10 MG tablet Take 0.5 tablets (5 mg total) by mouth daily. 30 tablet 11  . atorvastatin (LIPITOR) 40 MG tablet TAKE 1 TABLET(40 MG) BY MOUTH DAILY 90 tablet 0  . budesonide (PULMICORT) 0.25 MG/2ML nebulizer solution Take 2 mLs (0.25 mg total) by nebulization 2 (two) times daily. (Patient taking differently: Take 0.25 mg by nebulization 2 (two) times daily as needed (for SOB). ) 360 mL 1  . budesonide-formoterol (SYMBICORT) 160-4.5 MCG/ACT inhaler Inhale 2 puffs into  the lungs 2 (two) times daily. 1 Inhaler 6  . Cholecalciferol (VITAMIN D) 2000 UNITS CAPS Take 2,000 Units by mouth daily.    . dabigatran (PRADAXA) 150 MG CAPS capsule Take 1 capsule (150 mg total) by mouth 2 (two) times daily. 60 capsule 1  . diphenhydrAMINE (BENADRYL) 25 MG tablet Take 25 mg by mouth at bedtime.    . fexofenadine (ALLEGRA) 180 MG tablet Take 1 tablet (180 mg total) by mouth daily. 30 tablet 1  . fluticasone (FLONASE) 50 MCG/ACT nasal spray Place into both nostrils 2 (two) times daily as needed for allergies or rhinitis.    [name] gabapentin (NEURONTIN) 300 MG capsule Take 900 mg by mouth at bedtime.   3  . guaiFENesin (MUCINEX) 600 MG 12 hr tablet Take 600 mg by mouth 2 (two) times daily.    [name] ipratropium (ATROVENT) 0.02 % nebulizer solution Take 2.5 mLs (0.5 mg total) by nebulization 4 (four) times daily. (Patient taking differently: Take 0.5 mg by nebulization 4 (four) times daily as needed for wheezing or shortness of breath. ) 300 mL 1  . levalbuterol (XOPENEX) 0.63 MG/3ML nebulizer solution Take 3 mLs (0.63 mg total) by nebulization every 6 (six) hours as needed for wheezing or shortness of breath. (Patient taking differently: Take 0.63 mg by nebulization 2 (two) times daily as needed for wheezing or shortness of breath. ) 360 mL 1  . levothyroxine (SYNTHROID, LEVOTHROID) 175 MCG tablet Take 175 mcg by mouth daily before breakfast.    . Melatonin-Pyridoxine (MELATONEX) 3-10 MG TBCR Take 1 tablet by mouth at bedtime.    . metFORMIN (GLUMETZA) 500 MG (MOD) 24 hr tablet Take 500 mg by mouth daily with breakfast.    . metoprolol (LOPRESSOR) 50 MG tablet Take 1 tablet (50 mg total) by mouth 2 (two) times daily. 60 tablet 11  . pantoprazole (PROTONIX) 40 MG tablet Take 40 mg by mouth daily.    . potassium chloride (K-DUR) 10 MEQ tablet Take 2 tablets (20 mEq total) by mouth daily. 60 tablet 6  . promethazine (PHENERGAN) 25 MG tablet Take 25 mg by mouth every 6 (six) hours as needed for  nausea or vomiting.    [name] rOPINIRole (REQUIP) 1 MG tablet Take 1 tablet (1 mg total) by mouth daily. 90 tablet 2  . spironolactone (ALDACTONE) 25 MG tablet Take 1 tablet (25 mg total) by mouth daily. 90 tablet 3  . torsemide (DEMADEX) 20 MG tablet Take 2 tablets (40 mg total) by mouth daily. 60 tablet 6  . traMADol (ULTRAM) 50 MG tablet Take 50 mg by mouth daily. Reported on 10/17/2015    . valACYclovir (VALTREX) 1000 MG tablet Take 1 tablet by mouth every 12 (twelve) hours as needed (fever blisters). Reported on 10/17/2015     No current facility-administered medications for this encounter.     Allergies  Allergen Reactions  . Meloxicam Rash  . 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 syndrome 09/02/15, resolved with diphenhydramine and slowing of rate      Social History   Social History  . Marital status: Married    Spouse name: N/A  . Number of children: N/A  . Years of education: N/A   Occupational History  . Not on file.   Social History Main Topics  . Smoking status: Former Smoker    Packs/day: 0.50    Years: 15.00    Types: Cigarettes    Quit date: 07/18/1991  . Smokeless tobacco: Never Used  . Alcohol use Yes     Comment: occasional  . Drug use:     Types: Marijuana  . Sexual activity: Not Currently   Other Topics Concern  . Not on file   Social History Narrative   Lives in [place] with spouse.  Works as a Air traffic controller.    Family History  Problem Relation Age of Onset  . Heart failure Mother   . Stroke Father     Vitals:   04/04/16 1351  BP: (!) 128/56  Pulse: 70  SpO2: 93%  Weight: 214 lb 1.9 oz (97.1 kg)    Wt Readings from Last 3 Encounters:  04/04/16 214 lb 1.9 oz (97.1 kg)  03/07/16 218 lb (98.9 kg)  02/29/16 213 lb 9.6 oz (96.9 kg)     PHYSICAL EXAM: General:  Sitting in chair. NAD.  HEENT: Normal. On 02 via [place] Neck: supple. JVP 6 cm . Carotids 2+ bilat; no bruits. No thyromegaly or nodule noted.    Cor: PMI  nondisplaced. RRR. No murmurs, gallops, or rubs. Lungs: Decreased air movment. No wheeze Abdomen: soft, NT, ND, no HSM. No bruits or masses. +BS  Umbilical hernia present. Extremities: no cyanosis, clubbing, rash. Trace ankle edema at most. Neuro: alert & orientedx3, cranial nerves grossly intact. moves all 4 extremities w/o difficulty. Affect pleasant  ICD interrogated personally in clinic: Currently in atrial flutter/tach with loss of CRT. Has had > 20% AF in past 3 months.   ASSESSMENT & PLAN:   1. Chronic diastolic CHF with RV failure - Echo 02/02/16 LVEF 60-65%, Grade 3 DD. Mild AI, Mild MR, RV mildly dilated and severely reduced. RA mildly dilated. PA peak pressure 74 mm Hg. Concerns for restrictive cardiomyopathy. - Echo 02/08/16 LVEF 55-60%, Restrictive physiology, (?)RV normal. No assessment of PA pressures. - Volume status looks stable but symptoms much worse. likel due to AF/atrial tach   - Continue torsemide 40 mg daily. Can take an extra  20 mg as needed for weight gain. BMET today.  - Continue spiro 25 mg daily.  - Continue amlodipine 5 mg daily. - Continue lopressor 50 mg BID  2. Paroxysmal Afib s/p AF ablation/Maze s/p pacemaker - On ICD interrogation has recurrent AF/AFL which is quite symptomatic. Will arrange for DC-CV next week - Discussed with Chanetta Marshall NP who will arrange for visit with Dr. Rayann Heman for possible AA - He wants to switch from Pradaxa to Eliquis. We will switch 3. AS s/p AVR - Stable bioprosthesis with mild regurgitation on Echo 02/08/16 4. Pulmonary HTN - suspect pulmonary venous hypertension - PA peak pressure 74 mm Hg on Echo 02/02/16. Not measured on repeat echo 02/08/16 5. Mild CAD by cath 01/2016 - Medical management. Continue atorvastatin 40 mg daily - No CP.  Glori Bickers, MD 04/04/16

## 2016-04-04 NOTE — Telephone Encounter (Signed)
Pradaxa is non-preferred through [place] [facility]. Dr. Haroldine Laws agreeable to switch to Eliquis which is preferred and should be $45/mo.   Ruta Hinds. Velva Harman, PharmD, BCPS, CPP Clinical Pharmacist Pager: [phone] Phone: [phone] 04/04/2016 4:34 PM

## 2016-04-05 ENCOUNTER — Encounter: Payer: Self-pay | Admitting: Internal Medicine

## 2016-04-05 NOTE — Progress Notes (Signed)
Remote pacemaker transmission.   

## 2016-04-08 ENCOUNTER — Encounter (HOSPITAL_COMMUNITY)
Admission: RE | Disposition: A | Discharge status: Home / Self Care | Payer: Self-pay | Source: Ambulatory Visit | Attending: Internal Medicine

## 2016-04-08 ENCOUNTER — Encounter (HOSPITAL_COMMUNITY): Payer: Self-pay

## 2016-04-08 ENCOUNTER — Ambulatory Visit (HOSPITAL_COMMUNITY)
Admission: RE | Admit: 2016-04-08 | Discharge: 2016-04-08 | Disposition: A | Discharge status: Home / Self Care | Payer: Medicare Other | Source: Ambulatory Visit | Attending: Internal Medicine | Admitting: Internal Medicine

## 2016-04-08 ENCOUNTER — Ambulatory Visit (HOSPITAL_COMMUNITY): Payer: Medicare Other | Admitting: Certified Registered"

## 2016-04-08 ENCOUNTER — Encounter (HOSPITAL_COMMUNITY): Payer: Self-pay | Admitting: *Deleted

## 2016-04-08 ENCOUNTER — Ambulatory Visit (HOSPITAL_COMMUNITY): Admit: 2016-04-08 | Payer: Medicare Other | Admitting: Internal Medicine

## 2016-04-08 DIAGNOSIS — E785 Hyperlipidemia, unspecified: Secondary | ICD-10-CM | POA: Insufficient documentation

## 2016-04-08 DIAGNOSIS — E119 Type 2 diabetes mellitus without complications: Secondary | ICD-10-CM | POA: Insufficient documentation

## 2016-04-08 DIAGNOSIS — I480 Paroxysmal atrial fibrillation: Secondary | ICD-10-CM | POA: Insufficient documentation

## 2016-04-08 DIAGNOSIS — Z87891 Personal history of nicotine dependence: Secondary | ICD-10-CM | POA: Insufficient documentation

## 2016-04-08 DIAGNOSIS — I5032 Chronic diastolic (congestive) heart failure: Secondary | ICD-10-CM | POA: Diagnosis not present

## 2016-04-08 DIAGNOSIS — Z7951 Long term (current) use of inhaled steroids: Secondary | ICD-10-CM | POA: Diagnosis not present

## 2016-04-08 DIAGNOSIS — G4733 Obstructive sleep apnea (adult) (pediatric): Secondary | ICD-10-CM | POA: Insufficient documentation

## 2016-04-08 DIAGNOSIS — G2581 Restless legs syndrome: Secondary | ICD-10-CM | POA: Diagnosis not present

## 2016-04-08 DIAGNOSIS — I441 Atrioventricular block, second degree: Secondary | ICD-10-CM | POA: Insufficient documentation

## 2016-04-08 DIAGNOSIS — J449 Chronic obstructive pulmonary disease, unspecified: Secondary | ICD-10-CM | POA: Insufficient documentation

## 2016-04-08 DIAGNOSIS — I350 Nonrheumatic aortic (valve) stenosis: Secondary | ICD-10-CM | POA: Diagnosis not present

## 2016-04-08 DIAGNOSIS — Z7984 Long term (current) use of oral hypoglycemic drugs: Secondary | ICD-10-CM | POA: Diagnosis not present

## 2016-04-08 DIAGNOSIS — I4892 Unspecified atrial flutter: Secondary | ICD-10-CM | POA: Insufficient documentation

## 2016-04-08 DIAGNOSIS — Z79899 Other long term (current) drug therapy: Secondary | ICD-10-CM | POA: Insufficient documentation

## 2016-04-08 DIAGNOSIS — I110 Hypertensive heart disease with heart failure: Secondary | ICD-10-CM | POA: Insufficient documentation

## 2016-04-08 DIAGNOSIS — I340 Nonrheumatic mitral (valve) insufficiency: Secondary | ICD-10-CM | POA: Diagnosis not present

## 2016-04-08 DIAGNOSIS — Z952 Presence of prosthetic heart valve: Secondary | ICD-10-CM | POA: Insufficient documentation

## 2016-04-08 DIAGNOSIS — Z7901 Long term (current) use of anticoagulants: Secondary | ICD-10-CM | POA: Diagnosis not present

## 2016-04-08 DIAGNOSIS — I2510 Atherosclerotic heart disease of native coronary artery without angina pectoris: Secondary | ICD-10-CM | POA: Insufficient documentation

## 2016-04-08 DIAGNOSIS — Z950 Presence of cardiac pacemaker: Secondary | ICD-10-CM | POA: Diagnosis not present

## 2016-04-08 DIAGNOSIS — E039 Hypothyroidism, unspecified: Secondary | ICD-10-CM | POA: Diagnosis not present

## 2016-04-08 DIAGNOSIS — Z923 Personal history of irradiation: Secondary | ICD-10-CM | POA: Insufficient documentation

## 2016-04-08 DIAGNOSIS — K219 Gastro-esophageal reflux disease without esophagitis: Secondary | ICD-10-CM | POA: Diagnosis not present

## 2016-04-08 HISTORY — PX: CARDIOVERSION: SHX1299

## 2016-04-08 LAB — GLUCOSE, CAPILLARY: Glucose-Capillary: 88 mg/dL (ref 65–99)

## 2016-04-08 SURGERY — CARDIOVERSION
Anesthesia: General

## 2016-04-08 SURGERY — RIGHT HEART CATH
Anesthesia: LOCAL

## 2016-04-08 MED ORDER — PROPOFOL 10 MG/ML IV BOLUS
INTRAVENOUS | Status: AC
  Filled 2016-04-08: qty 20

## 2016-04-08 MED ORDER — PROPOFOL 10 MG/ML IV BOLUS
INTRAVENOUS | Status: DC | PRN
  Administered 2016-04-08: 80 mg via INTRAVENOUS

## 2016-04-08 MED ORDER — LIDOCAINE 2% (20 MG/ML) 5 ML SYRINGE
INTRAMUSCULAR | Status: DC | PRN
  Administered 2016-04-08: 100 mg via INTRAVENOUS

## 2016-04-08 MED ORDER — LIDOCAINE 2% (20 MG/ML) 5 ML SYRINGE
INTRAMUSCULAR | Status: AC
  Filled 2016-04-08: qty 5

## 2016-04-08 MED ORDER — SODIUM CHLORIDE 0.9 % IV SOLN
INTRAVENOUS | Status: DC | PRN
  Administered 2016-04-08: 13:00:00 via INTRAVENOUS

## 2016-04-08 NOTE — Interval H&P Note (Signed)
History and Physical Interval Note:  04/08/2016 1:04 PM  RHYSE HOUGEN  has presented today for surgery, with the diagnosis of afib  The various methods of treatment have been discussed with the patient and family. After consideration of risks, benefits and other options for treatment, the patient has consented to  Procedure(s): CARDIOVERSION (N/A) as a surgical intervention .  The patient's history has been reviewed, patient examined, no change in status, stable for surgery.  I have reviewed the patient's chart and labs.  Questions were answered to the patient's satisfaction.     Bensimhon, Quillian Quince

## 2016-04-08 NOTE — Anesthesia Preprocedure Evaluation (Addendum)
Anesthesia Evaluation  Patient identified by MRN, date of birth, ID band Patient awake    Reviewed: Allergy & Precautions, H&P , Patient's Chart, lab work & pertinent test results, reviewed documented beta blocker date and time   Airway Mallampati: II  TM Distance: >3 FB Neck ROM: full    Dental no notable dental hx. (+) Chipped, Poor Dentition   Pulmonary former smoker,    Pulmonary exam normal breath sounds clear to auscultation       Cardiovascular hypertension, + Valvular Problems/Murmurs AI  Rhythm:regular Rate:Normal     Neuro/Psych    GI/Hepatic   Endo/Other  diabetes, Type 2  Renal/GU      Musculoskeletal   Abdominal   Peds  Hematology   Anesthesia Other Findings Good LV fxn Mild AI  Rare inhaler use  Reproductive/Obstetrics                            Anesthesia Physical Anesthesia Plan  ASA: III  Anesthesia Plan: General   Post-op Pain Management:    Induction: Intravenous  Airway Management Planned: Mask  Additional Equipment:   Intra-op Plan:   Post-operative Plan:   Informed Consent: I have reviewed the patients History and Physical, chart, labs and discussed the procedure including the risks, benefits and alternatives for the proposed anesthesia with the patient or authorized representative who has indicated his/her understanding and acceptance.   Dental Advisory Given and Dental advisory given  Plan Discussed with: CRNA and Surgeon  Anesthesia Plan Comments: (Discussed GA with LMA, possible sore throat, potential need to switch to ETT, N/V, pulmonary aspiration. Questions answered. )       Anesthesia Quick Evaluation

## 2016-04-08 NOTE — Transfer of Care (Signed)
Immediate Anesthesia Transfer of Care Note  Patient: [name]  Procedure(s) Performed: Procedure(s): CARDIOVERSION (N/A)  Patient Location: Endoscopy Unit  Anesthesia Type:MAC  Level of Consciousness: awake  Airway & Oxygen Therapy: Patient Spontanous Breathing and Patient connected to nasal cannula oxygen  Post-op Assessment: Report given to RN  Post vital signs: Reviewed and stable  Last Vitals:  Vitals:   04/08/16 1313 04/08/16 1314  BP: (!) 147/73   Pulse: 76 76  Resp: (!) 25 (!) 25    Last Pain:  Vitals:   04/08/16 1202  TempSrc: Oral         Complications: No apparent anesthesia complications

## 2016-04-08 NOTE — H&P (View-Only) (Signed)
Advanced Heart Failure Clinic Note   Primary Care: Velna Hatchet, MD Primary Cardiologist: Dr. Acie Fredrickson Primary HF: Dr. Haroldine Laws   HPI:  [name] is a 65 y.o. 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   Admitted from Dr. Acie Fredrickson office 02/02/16 with severe DOE with any exercise or walking 10-20 feet. Pt admitted and planned for [facility] 02/05/16. Diuresed with IV lasix with rapid improvement of symptoms. Overall diuresed 15.2 L and down 14 lbs. Discharge weight was 205 lbs.  Echo 02/02/16 LVEF 60-65%, Grade 3 DD. Mild AI, Mild MR, RV mildly dilated and severely reduced. RA mildly dilated. PA peak pressure 74 mm Hg. Concerns for restrictive cardiomyopathy.  L/RHC 02/05/16 with no significant CAD. RHC showed significant difference between PCWP (50) and LVEDP (30) suggestive of MS but no MS on Echo. Weight at d/c was 205 pounds.  Echo 02/08/16 LVEF 55-60%, Restrictive physiology, (?)RV normal. No assessment of PA pressures.  He presents today for regular follow up. Says he has been felling rough lately. More SOB. Legs hurt. Weight at home stable 205-206. Legs hurt mainly in thighs. Hurts at rest. Better with walking. Can do ADLs and goes to the store but gives out easily. Sleeps in recliner. No edema.    L/RHC 02/05/16 - Mid RCA lesion, 10 %stenosed. - Ost Cx to Prox Cx lesion, 20 %stenosed. - LV end diastolic pressure is moderately elevated. - Hemodynamic findings consistent with severe pulmonary hypertension. - No evidence of discordance on the LV/RV tracing. No evidence of pericardial constriction. - Severly elevated PCWP. - PA sat 51%. CO 6.2 L/min. Cardiac index 2.87. - No significant aortic valve gradient. - There is no aortic valve stenosis.  RHC Procedural Findings: Hemodynamics (mmHg) RA mean 30 RV 87/9 (26) PA 100/56 (72) PCWP 51 LVEDP  31 AO 142/73 (100) Cardiac Output (Fick) 6.21 Cardiac Index (Fick) 2.87 PVR = 3.5 WU    PFTs 4/17  FEV1  1.6 (47%) FVC 2.25 (49%) DLCO 54%   Past Medical History:  Diagnosis Date  . Aortic stenosis, mild   . Arthritis of hand    "just a little bit in both hands" (03/31/2012)  . Asthma    "little bit" (03/31/2012)  . Atrial fibrillation [facility])    dx '04; DCCV '04, placed on flecainide, failed DCCV 04/2010, flecainide stopped; s/p successful A.fib ablation 01/31/12  . CHF (congestive heart failure) ([place])   . Diabetes mellitus without complication (HCC)    borderline  . DJD (degenerative joint disease)   . Exertional dyspnea   . Fibromyalgia   . First degree AV block    post ablation, ~450ms  . GERD (gastroesophageal reflux disease)   . Heart murmur   . Hyperlipidemia   . Hypertension   . Hypothyroidism    S/P radiation  . Left atrial enlargement    LA size 49mm by echo 11/21/11  . Mitral regurgitation    trivial  . Obesity   . Obstructive sleep apnea    mild by sleep study 2013; pt stated he does not have a machine because "it wasn't bad enough for him to have one"  . Pacemaker 03/31/2012  . Restless leg syndrome   . Second degree AV block     Current Outpatient Prescriptions  Medication Sig Dispense Refill  . albuterol (PROVENTIL HFA;VENTOLIN HFA) 108 (90 Base) MCG/ACT inhaler  Inhale 2 puffs into the lungs every 6 (six) hours as needed for wheezing or shortness of breath. 1 Inhaler 5  . amLODipine (NORVASC) 10 MG tablet Take 0.5 tablets (5 mg total) by mouth daily. 30 tablet 11  . atorvastatin (LIPITOR) 40 MG tablet TAKE 1 TABLET(40 MG) BY MOUTH DAILY 90 tablet 0  . budesonide (PULMICORT) 0.25 MG/2ML nebulizer solution Take 2 mLs (0.25 mg total) by nebulization 2 (two) times daily. (Patient taking differently: Take 0.25 mg by nebulization 2 (two) times daily as needed (for SOB). ) 360 mL 1  . budesonide-formoterol (SYMBICORT) 160-4.5 MCG/ACT inhaler Inhale 2 puffs into  the lungs 2 (two) times daily. 1 Inhaler 6  . Cholecalciferol (VITAMIN D) 2000 UNITS CAPS Take 2,000 Units by mouth daily.    . dabigatran (PRADAXA) 150 MG CAPS capsule Take 1 capsule (150 mg total) by mouth 2 (two) times daily. 60 capsule 1  . diphenhydrAMINE (BENADRYL) 25 MG tablet Take 25 mg by mouth at bedtime.    . fexofenadine (ALLEGRA) 180 MG tablet Take 1 tablet (180 mg total) by mouth daily. 30 tablet 1  . fluticasone (FLONASE) 50 MCG/ACT nasal spray Place into both nostrils 2 (two) times daily as needed for allergies or rhinitis.    [name] gabapentin (NEURONTIN) 300 MG capsule Take 900 mg by mouth at bedtime.   3  . guaiFENesin (MUCINEX) 600 MG 12 hr tablet Take 600 mg by mouth 2 (two) times daily.    [name] ipratropium (ATROVENT) 0.02 % nebulizer solution Take 2.5 mLs (0.5 mg total) by nebulization 4 (four) times daily. (Patient taking differently: Take 0.5 mg by nebulization 4 (four) times daily as needed for wheezing or shortness of breath. ) 300 mL 1  . levalbuterol (XOPENEX) 0.63 MG/3ML nebulizer solution Take 3 mLs (0.63 mg total) by nebulization every 6 (six) hours as needed for wheezing or shortness of breath. (Patient taking differently: Take 0.63 mg by nebulization 2 (two) times daily as needed for wheezing or shortness of breath. ) 360 mL 1  . levothyroxine (SYNTHROID, LEVOTHROID) 175 MCG tablet Take 175 mcg by mouth daily before breakfast.    . Melatonin-Pyridoxine (MELATONEX) 3-10 MG TBCR Take 1 tablet by mouth at bedtime.    . metFORMIN (GLUMETZA) 500 MG (MOD) 24 hr tablet Take 500 mg by mouth daily with breakfast.    . metoprolol (LOPRESSOR) 50 MG tablet Take 1 tablet (50 mg total) by mouth 2 (two) times daily. 60 tablet 11  . pantoprazole (PROTONIX) 40 MG tablet Take 40 mg by mouth daily.    . potassium chloride (K-DUR) 10 MEQ tablet Take 2 tablets (20 mEq total) by mouth daily. 60 tablet 6  . promethazine (PHENERGAN) 25 MG tablet Take 25 mg by mouth every 6 (six) hours as needed for  nausea or vomiting.    [name] rOPINIRole (REQUIP) 1 MG tablet Take 1 tablet (1 mg total) by mouth daily. 90 tablet 2  . spironolactone (ALDACTONE) 25 MG tablet Take 1 tablet (25 mg total) by mouth daily. 90 tablet 3  . torsemide (DEMADEX) 20 MG tablet Take 2 tablets (40 mg total) by mouth daily. 60 tablet 6  . traMADol (ULTRAM) 50 MG tablet Take 50 mg by mouth daily. Reported on 10/17/2015    . valACYclovir (VALTREX) 1000 MG tablet Take 1 tablet by mouth every 12 (twelve) hours as needed (fever blisters). Reported on 10/17/2015     No current facility-administered medications for this encounter.     Allergies  Allergen Reactions  . Meloxicam Rash  . 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 syndrome 09/02/15, resolved with diphenhydramine and slowing of rate      Social History   Social History  . Marital status: Married    Spouse name: N/A  . Number of children: N/A  . Years of education: N/A   Occupational History  . Not on file.   Social History Main Topics  . Smoking status: Former Smoker    Packs/day: 0.50    Years: 15.00    Types: Cigarettes    Quit date: 07/18/1991  . Smokeless tobacco: Never Used  . Alcohol use Yes     Comment: occasional  . Drug use:     Types: Marijuana  . Sexual activity: Not Currently   Other Topics Concern  . Not on file   Social History Narrative   Lives in [place] with spouse.  Works as a Air traffic controller.    Family History  Problem Relation Age of Onset  . Heart failure Mother   . Stroke Father     Vitals:   04/04/16 1351  BP: (!) 128/56  Pulse: 70  SpO2: 93%  Weight: 214 lb 1.9 oz (97.1 kg)    Wt Readings from Last 3 Encounters:  04/04/16 214 lb 1.9 oz (97.1 kg)  03/07/16 218 lb (98.9 kg)  02/29/16 213 lb 9.6 oz (96.9 kg)     PHYSICAL EXAM: General:  Sitting in chair. NAD.  HEENT: Normal. On 02 via [place] Neck: supple. JVP 6 cm . Carotids 2+ bilat; no bruits. No thyromegaly or nodule noted.    Cor: PMI  nondisplaced. RRR. No murmurs, gallops, or rubs. Lungs: Decreased air movment. No wheeze Abdomen: soft, NT, ND, no HSM. No bruits or masses. +BS  Umbilical hernia present. Extremities: no cyanosis, clubbing, rash. Trace ankle edema at most. Neuro: alert & orientedx3, cranial nerves grossly intact. moves all 4 extremities w/o difficulty. Affect pleasant  ICD interrogated personally in clinic: Currently in atrial flutter/tach with loss of CRT. Has had > 20% AF in past 3 months.   ASSESSMENT & PLAN:   1. Chronic diastolic CHF with RV failure - Echo 02/02/16 LVEF 60-65%, Grade 3 DD. Mild AI, Mild MR, RV mildly dilated and severely reduced. RA mildly dilated. PA peak pressure 74 mm Hg. Concerns for restrictive cardiomyopathy. - Echo 02/08/16 LVEF 55-60%, Restrictive physiology, (?)RV normal. No assessment of PA pressures. - Volume status looks stable but symptoms much worse. likel due to AF/atrial tach   - Continue torsemide 40 mg daily. Can take an extra  20 mg as needed for weight gain. BMET today.  - Continue spiro 25 mg daily.  - Continue amlodipine 5 mg daily. - Continue lopressor 50 mg BID  2. Paroxysmal Afib s/p AF ablation/Maze s/p pacemaker - On ICD interrogation has recurrent AF/AFL which is quite symptomatic. Will arrange for DC-CV next week - Discussed with Chanetta Marshall NP who will arrange for visit with Dr. Rayann Heman for possible AA - He wants to switch from Pradaxa to Eliquis. We will switch 3. AS s/p AVR - Stable bioprosthesis with mild regurgitation on Echo 02/08/16 4. Pulmonary HTN - suspect pulmonary venous hypertension - PA peak pressure 74 mm Hg on Echo 02/02/16. Not measured on repeat echo 02/08/16 5. Mild CAD by cath 01/2016 - Medical management. Continue atorvastatin 40 mg daily - No CP.  Glori Bickers, MD 04/04/16

## 2016-04-08 NOTE — Discharge Instructions (Signed)
Electrical Cardioversion, Care After °This sheet gives you information about how to care for yourself after your procedure. Your health care provider may also give you more specific instructions. If you have problems or questions, contact your health care provider. °What can I expect after the procedure? °After the procedure, it is common to have: °· Some redness on the skin where the shocks were given. °Follow these instructions at home: °· Do not drive for 24 hours if you were given a medicine to help you relax (sedative). °· Take over-the-counter and prescription medicines only as told by your health care provider. °· Ask your health care provider how to check your pulse. Check it often. °· Rest for 48 hours after the procedure or as told by your health care provider. °· Avoid or limit your caffeine use as told by your health care provider. °Contact a health care provider if: °· You feel like your heart is beating too quickly or your pulse is not regular. °· You have a serious muscle cramp that does not go away. °Get help right away if: °· You have discomfort in your chest. °· You are dizzy or you feel faint. °· You have trouble breathing or you are short of breath. °· Your speech is slurred. °· You have trouble moving an arm or leg on one side of your body. °· Your fingers or toes turn cold or blue. °This information is not intended to replace advice given to you by your health care provider. Make sure you discuss any questions you have with your health care provider. °Document Released: 02/03/2013 Document Revised: 11/17/2015 Document Reviewed: 10/20/2015 °Elsevier Interactive Patient Education © 2017 Elsevier Inc. ° °

## 2016-04-08 NOTE — CV Procedure (Signed)
     DIRECT CURRENT CARDIOVERSION  NAME:  [name]   MRN: [id number] DOB:  1950/05/13   ADMIT DATE: 04/08/2016   INDICATIONS: Atrial flutter   PROCEDURE:   Informed consent was obtained prior to the procedure. The risks, benefits and alternatives for the procedure were discussed and the patient comprehended these risks. Once an appropriate time out was taken, the patient had the defibrillator pads placed in the anterior and posterior position. The patient then underwent sedation by the anesthesia service. Once an appropriate level of sedation was achieved, the patient received a single biphasic, synchronized 150J shock with prompt conversion to sinus rhythm. No apparent complications. Bensimhon, Daniel,MD 1:12 PM

## 2016-04-08 NOTE — Anesthesia Postprocedure Evaluation (Signed)
Anesthesia Post Note  Patient: [name]  Procedure(s) Performed: Procedure(s) (LRB): CARDIOVERSION (N/A)  Patient location during evaluation: PACU Anesthesia Type: General Level of consciousness: sedated Pain management: satisfactory to patient Vital Signs Assessment: post-procedure vital signs reviewed and stable Respiratory status: spontaneous breathing Cardiovascular status: stable Anesthetic complications: no    Last Vitals:  Vitals:   04/08/16 1330 04/08/16 1340  BP: 133/62 (!) 135/59  Pulse: 75 74  Resp: (!) 21 20  Temp:      Last Pain:  Vitals:   04/08/16 1315  TempSrc: Oral                 JACKSON,KYLE EDWARD

## 2016-04-10 ENCOUNTER — Encounter (HOSPITAL_COMMUNITY): Payer: Self-pay | Admitting: Internal Medicine

## 2016-04-10 ENCOUNTER — Encounter: Payer: Self-pay | Admitting: Cardiology

## 2016-04-10 NOTE — Addendum Note (Signed)
Addendum  created 04/10/16 2215 by Lyndle Herrlich, MD   Sign clinical note, SmartForm saved

## 2016-04-11 ENCOUNTER — Ambulatory Visit (INDEPENDENT_AMBULATORY_CARE_PROVIDER_SITE_OTHER): Payer: Medicare Other | Admitting: Internal Medicine

## 2016-04-11 ENCOUNTER — Encounter: Payer: Self-pay | Admitting: Internal Medicine

## 2016-04-11 VITALS — BP 120/68 | HR 81 | Ht 71.0 in | Wt 213.4 lb

## 2016-04-11 DIAGNOSIS — I441 Atrioventricular block, second degree: Secondary | ICD-10-CM

## 2016-04-11 DIAGNOSIS — I5032 Chronic diastolic (congestive) heart failure: Secondary | ICD-10-CM | POA: Diagnosis not present

## 2016-04-11 DIAGNOSIS — I272 Pulmonary hypertension, unspecified: Secondary | ICD-10-CM

## 2016-04-11 DIAGNOSIS — I481 Persistent atrial fibrillation: Secondary | ICD-10-CM | POA: Diagnosis not present

## 2016-04-11 DIAGNOSIS — I4819 Other persistent atrial fibrillation: Secondary | ICD-10-CM

## 2016-04-11 LAB — CUP PACEART INCLINIC DEVICE CHECK
Battery Voltage: 2.9 V
Brady Statistic RA Percent Paced: 4.7 %
Brady Statistic RV Percent Paced: 99.85 %
Date Time Interrogation Session: 20171214163651
Implantable Lead Implant Date: 20131203
Implantable Lead Implant Date: 20131203
Implantable Lead Location: 753859
Implantable Lead Location: 753860
Implantable Lead Model: 1948
Implantable Pulse Generator Implant Date: 20131203
Lead Channel Impedance Value: 390 Ohm
Lead Channel Impedance Value: 560 Ohm
Lead Channel Pacing Threshold Amplitude: 0.75 V
Lead Channel Pacing Threshold Amplitude: 1 V
Lead Channel Pacing Threshold Pulse Width: 0.5 ms
Lead Channel Pacing Threshold Pulse Width: 0.8 ms
Lead Channel Sensing Intrinsic Amplitude: 3.8 mV
Lead Channel Setting Pacing Amplitude: 1.25 V
Lead Channel Setting Pacing Amplitude: 1.75 V
Lead Channel Setting Pacing Pulse Width: 0.8 ms
Lead Channel Setting Sensing Sensitivity: 4 mV
Pulse Gen Model: 2210
Pulse Gen Serial Number: 7408461

## 2016-04-11 NOTE — Patient Instructions (Signed)
Medication Instructions:  Your physician recommends that you continue on your current medications as directed. Please refer to the Current Medication list given to you today.   Labwork: None ordered   Testing/Procedures: Tikosyn admission on Tuesday.    Follow-Up: Your physician recommends that you schedule a follow-up appointment after hospitalization    Any Other Special Instructions Will Be Listed Below (If Applicable).     If you need a refill on your cardiac medications before your next appointment, please call your pharmacy.

## 2016-04-11 NOTE — Progress Notes (Signed)
Electrophysiology Office Note   Date:  04/11/2016   ID:  [name], DOB December 05, 1950, MRN [id number]  PCP:  Velna Hatchet, MD  Cardiologist:  Dr Acie Fredrickson Primary Electrophysiologist: Thompson Grayer, MD    Chief Complaint  Patient presents with  . Atrial Fibrillation     History of Present Illness: [name] is a 65 y.o. male who presents today for electrophysiology evaluation.   Has chronic SOB, fatigue, and edema.  He has pulmonary hypertension and now wears O2.   He is s/p aortic valve replacement with LAA clip and MAZE 4/17.  He has persistent afib for which he was recently cardioverted by Dr Haroldine Laws.  He does not feel better since cardioversion.  He actually states that he feels "terrible" today. I have found that his symptomatic complaints today are reflective of similar complaints over the past 4-5 years. Today, he denies symptoms of palpitations, chest pain,  orthopnea, PND, claudication, dizziness, presyncope, syncope, bleeding, or neurologic sequela. The patient is tolerating medications without difficulties and is otherwise without complaint today.    Past Medical History:  Diagnosis Date  . Aortic stenosis, mild   . Arthritis of hand    "just a little bit in both hands" (03/31/2012)  . Asthma    "little bit" (03/31/2012)  . Atrial fibrillation [facility])    dx '04; DCCV '04, placed on flecainide, failed DCCV 04/2010, flecainide stopped; s/p successful A.fib ablation 01/31/12  . CHF (congestive heart failure) ([place])   . Diabetes mellitus without complication (HCC)    borderline  . DJD (degenerative joint disease)   . Exertional dyspnea   . Fibromyalgia   . First degree AV block    post ablation, ~427ms  . GERD (gastroesophageal reflux disease)   . Heart murmur   . Hyperlipidemia   . Hypertension   . Hypothyroidism    S/P radiation  . Left atrial enlargement    LA size 63mm by echo 11/21/11  . Mitral regurgitation    trivial  . Obesity   . Obstructive  sleep apnea    mild by sleep study 2013; pt stated he does not have a machine because "it wasn't bad enough for him to have one"  . Pacemaker 03/31/2012  . Restless leg syndrome   . Second degree AV block    Past Surgical History:  Procedure Laterality Date  . AORTIC VALVE REPLACEMENT N/A 08/22/2015   Procedure: AORTIC VALVE REPLACEMENT (AVR);  Surgeon: Ivin Poot, MD;  Location: [place];  Service: Open Heart Surgery;  Laterality: N/A;  . ATRIAL FIBRILLATION ABLATION  01/30/2012   PVI by Dr. Rayann Heman  . ATRIAL FIBRILLATION ABLATION N/A 01/31/2012   Procedure: ATRIAL FIBRILLATION ABLATION;  Surgeon: Thompson Grayer, MD;  Location: [facility] CATH LAB;  Service: Cardiovascular;  Laterality: N/A;  . BACK SURGERY     X 3  . CARDIAC CATHETERIZATION N/A 08/09/2015   Procedure: Right/Left Heart Cath and Coronary Angiography;  Surgeon: Peter M Martinique, MD;  Location: [place] CV LAB;  Service: Cardiovascular;  Laterality: N/A;  . CARDIAC CATHETERIZATION N/A 02/05/2016   Procedure: Right/Left Heart Cath and Coronary Angiography;  Surgeon: Jettie Booze, MD;  Location: [place] CV LAB;  Service: Cardiovascular;  Laterality: N/A;  . CARDIOVASCULAR STRESS TEST  03/12/2002   EF 48%, NO EVIDENCE OF ISCHEMIA  . CARDIOVERSION  01/2012; 03/31/2012  . CARDIOVERSION N/A 02/25/2012   Procedure: CARDIOVERSION;  Surgeon: Thompson Grayer, MD;  Location: [facility] CATH LAB;  Service: Cardiovascular;  Laterality: N/A;  . CARDIOVERSION N/A 03/31/2012   Procedure: CARDIOVERSION;  Surgeon: Thompson Grayer, MD;  Location: [facility] CATH LAB;  Service: Cardiovascular;  Laterality: N/A;  . CARDIOVERSION N/A 11/23/2015   Procedure: CARDIOVERSION;  Surgeon: Larey Dresser, MD;  Location: [facility] ENDOSCOPY;  Service: Cardiovascular;  Laterality: N/A;  . CARDIOVERSION N/A 04/08/2016   Procedure: CARDIOVERSION;  Surgeon: Jolaine Artist, MD;  Location: [facility] ENDOSCOPY;  Service: Cardiovascular;  Laterality: N/A;  . CLIPPING OF ATRIAL APPENDAGE N/A  08/22/2015   Procedure: CLIPPING OF ATRIAL APPENDAGE;  Surgeon: Ivin Poot, MD;  Location: [place];  Service: Open Heart Surgery;  Laterality: N/A;  . DOPPLER ECHOCARDIOGRAPHY  03/11/2002   EF 70-75%  . FINGER SURGERY Left    Middle finger  . FINGER TENDON REPAIR  1980's   "right little finger" (03/31/2012)  . INSERT / REPLACE / REMOVE PACEMAKER     [employer]  . LEFT AND RIGHT HEART CATHETERIZATION WITH CORONARY ANGIOGRAM N/A 10/27/2012   Procedure: LEFT AND RIGHT HEART CATHETERIZATION WITH CORONARY ANGIOGRAM;  Surgeon: Laverda Page, MD;  Location: [facility] CATH LAB;  Service: Cardiovascular;  Laterality: N/A;  . LUMBAR DISC SURGERY  1980's X2;  2000's  . MAZE N/A 08/22/2015   Procedure: MAZE;  Surgeon: Ivin Poot, MD;  Location: [place];  Service: Open Heart Surgery;  Laterality: N/A;  . PACEMAKER INSERTION  03/31/2012   STJ Accent DR pacemaker implanted by Dr Rayann Heman  . PERMANENT PACEMAKER INSERTION N/A 03/31/2012   Procedure: PERMANENT PACEMAKER INSERTION;  Surgeon: Thompson Grayer, MD;  Location: [facility] CATH LAB;  Service: Cardiovascular;  Laterality: N/A;  . TEE WITHOUT CARDIOVERSION  01/30/2012   Procedure: TRANSESOPHAGEAL ECHOCARDIOGRAM (TEE);  Surgeon: Larey Dresser, MD;  Location: [place];  Service: Cardiovascular;  Laterality: N/A;  ablation next day  . TEE WITHOUT CARDIOVERSION N/A 08/22/2015   Procedure: TRANSESOPHAGEAL ECHOCARDIOGRAM (TEE);  Surgeon: Ivin Poot, MD;  Location: [place];  Service: Open Heart Surgery;  Laterality: N/A;  . US ECHOCARDIOGRAPHY  08/07/2009   EF 55-60%     Current Outpatient Prescriptions  Medication Sig Dispense Refill  . albuterol (PROVENTIL HFA;VENTOLIN HFA) 108 (90 Base) MCG/ACT inhaler Inhale 2 puffs into the lungs every 6 (six) hours as needed for wheezing or shortness of breath. 1 Inhaler 5  . amLODipine (NORVASC) 10 MG tablet Take 0.5 tablets (5 mg total) by mouth daily. 30 tablet 11  . apixaban (ELIQUIS) 5 MG TABS tablet Take 1 tablet (5 mg  total) by mouth 2 (two) times daily. 30 tablet 3  . atorvastatin (LIPITOR) 40 MG tablet TAKE 1 TABLET(40 MG) BY MOUTH DAILY 90 tablet 0  . budesonide (PULMICORT) 0.25 MG/2ML nebulizer solution Take 2 mLs (0.25 mg total) by nebulization 2 (two) times daily. 360 mL 1  . budesonide-formoterol (SYMBICORT) 160-4.5 MCG/ACT inhaler Inhale 2 puffs into the lungs 2 (two) times daily. 1 Inhaler 6  . Cholecalciferol (VITAMIN D) 2000 UNITS CAPS Take 2,000 Units by mouth daily.    . diphenhydrAMINE (BENADRYL) 25 MG tablet Take 25 mg by mouth at bedtime.    . fexofenadine (ALLEGRA) 180 MG tablet Take 1 tablet (180 mg total) by mouth daily. 30 tablet 1  . fluticasone (FLONASE) 50 MCG/ACT nasal spray Place into both nostrils 2 (two) times daily as needed for allergies or rhinitis.    [name] gabapentin (NEURONTIN) 300 MG capsule Take 900 mg by mouth at bedtime.   3  . guaiFENesin (MUCINEX) 600 MG 12  hr tablet Take 600 mg by mouth 2 (two) times daily.    [name] ipratropium (ATROVENT) 0.02 % nebulizer solution Take 2.5 mLs (0.5 mg total) by nebulization 4 (four) times daily. 300 mL 1  . levalbuterol (XOPENEX) 0.63 MG/3ML nebulizer solution Take 3 mLs (0.63 mg total) by nebulization every 6 (six) hours as needed for wheezing or shortness of breath. 360 mL 1  . levothyroxine (SYNTHROID, LEVOTHROID) 175 MCG tablet Take 175 mcg by mouth daily before breakfast.    . metFORMIN (GLUMETZA) 500 MG (MOD) 24 hr tablet Take 500 mg by mouth daily with breakfast.    . metoprolol (LOPRESSOR) 50 MG tablet Take 1 tablet (50 mg total) by mouth 2 (two) times daily. 60 tablet 11  . pantoprazole (PROTONIX) 40 MG tablet Take 40 mg by mouth daily.    . potassium chloride (K-DUR) 10 MEQ tablet Take 2 tablets (20 mEq total) by mouth daily. 60 tablet 6  . promethazine (PHENERGAN) 25 MG tablet Take 25 mg by mouth every 6 (six) hours as needed for nausea or vomiting.    [name] rOPINIRole (REQUIP) 1 MG tablet Take 1 tablet (1 mg total) by mouth daily. 90  tablet 2  . spironolactone (ALDACTONE) 25 MG tablet Take 1 tablet (25 mg total) by mouth daily. 90 tablet 3  . torsemide (DEMADEX) 20 MG tablet Take 2 tablets (40 mg total) by mouth daily. 60 tablet 6  . traMADol (ULTRAM) 50 MG tablet Take 50 mg by mouth daily. Reported on 10/17/2015    . traZODone (DESYREL) 50 MG tablet Take 50 mg by mouth at bedtime.    . valACYclovir (VALTREX) 1000 MG tablet Take 1 tablet by mouth every 12 (twelve) hours as needed (fever blisters). Reported on 10/17/2015     No current facility-administered medications for this visit.     Allergies:   Meloxicam and Vancomycin   Social History:  The patient  reports that he quit smoking about 24 years ago. His smoking use included Cigarettes. He has a 7.50 pack-year smoking history. He has never used smokeless tobacco. He reports that he drinks alcohol. He reports that he uses drugs, including Marijuana.   Family History:  The patient's family history includes Heart failure in his mother; Stroke in his father.    ROS:  Please see the history of present illness.   All other systems are reviewed and negative.    PHYSICAL EXAM: VS:  BP 120/68   Pulse 81   Ht 5\' 11"  (1.803 m)   Wt 213 lb 6.4 oz (96.8 kg)   BMI 29.76 kg/m  , BMI Body mass index is 29.76 kg/m. GEN: Well nourished, well developed, in no acute distress  HEENT: normal  Neck: no JVD, carotid bruits, or masses Cardiac: RRR (paced)  Respiratory:  Coarse BS at bases, diffuse expiratory wheezes, normal work of breathing GI: soft, nontender, nondistended, + BS MS: no deformity or atrophy  Skin: warm and dry, device pocket is well healed Neuro:  Strength and sensation are intact Psych: euthymic mood, full affect  Device interrogation is reviewed today in detail.  See PaceArt for details.   Recent Labs: 08/23/2015: Magnesium 2.4 09/04/2015: TSH 0.441 01/15/2016: Pro B Natriuretic peptide (BNP) 470.0 01/29/2016: ALT 14 02/06/2016: Hemoglobin 9.0; Platelets  234 02/29/2016: B Natriuretic Peptide 181.9; BUN 23; Creatinine, Ser 1.56; Potassium 4.2; Sodium 135    Lipid Panel     Component Value Date/Time   CHOL 114 (L) 08/03/2015 1036   TRIG 111  08/03/2015 1036   HDL 30 (L) 08/03/2015 1036   CHOLHDL 3.8 08/03/2015 1036   VLDL 22 08/03/2015 1036   LDLCALC 62 08/03/2015 1036     Wt Readings from Last 3 Encounters:  04/11/16 213 lb 6.4 oz (96.8 kg)  04/04/16 214 lb 1.9 oz (97.1 kg)  03/07/16 218 lb (98.9 kg)      Other studies Reviewed: Additional studies/ records that were reviewed today include: Dr Acie Fredrickson notes , Dr Bensimhon's notes, recent echo and cath notes   ASSESSMENT AND PLAN:  1. Second degree AV block Normal pacemaker function See Pace Art report No changes  2. SOB Chronic and of an unclear etiology I have spoken with Dr Haroldine Laws who would like to repeat [place] soon.  Cardiac CT did not reveal pulmonary vein stenosis in 2015 however this could be repeated if needed.  3. afib Remains on eliquis S/p recent MAZE and LAA clipping Today, I spoke with Dr Haroldine Laws at length.  The patient does not feel any better s/p cardioversion.  My experience with Mr Nova over the past 3-4 years is that he typically cannot distinguish sinus rhythm from afib.   Dr Haroldine Laws would like for [place] to try to maintain sinus.  I have therefore offered tikosyn to the patient.  He would like to be admitted this coming Tuesday for initiation of tikosyn.  Dr Haroldine Laws will plan to perform RHC on therapeutic eliquis during that hospital stay. Continue eliquis without interruption given recent cardioversion.    4. htn Stable  Very complicated patient with high level of decision making required.  He is at risk for further decompensation.   Current medicines are reviewed at length with the patient today.   The patient does not have concerns regarding his medicines.  The following changes were made today:  none  Signed, Thompson Grayer, MD  04/11/2016  2:28 PM     [place] [address] [phone] (office) [phone] (fax)

## 2016-04-12 ENCOUNTER — Telehealth: Payer: Self-pay | Admitting: Internal Medicine

## 2016-04-12 ENCOUNTER — Telehealth (HOSPITAL_COMMUNITY): Payer: Self-pay | Admitting: Pharmacist

## 2016-04-12 NOTE — Telephone Encounter (Signed)
Mrs. Oh called asking if we had any samples of Tikosyn in case they are not approved for patient assistance before he is discharged from the hospital next week. I informed her that we do not keep any Tikosyn samples in stock but the [facility] office likely does so, if needed, she can call over there to verify that they do.   Ruta Hinds. Velva Harman, PharmD, BCPS, CPP Clinical Pharmacist Pager: [phone] Phone: [phone] 04/12/2016 12:42 PM

## 2016-04-12 NOTE — Telephone Encounter (Signed)
[name] is wanting to speak to a pharmacist

## 2016-04-15 ENCOUNTER — Telehealth: Payer: Self-pay | Admitting: Internal Medicine

## 2016-04-15 ENCOUNTER — Other Ambulatory Visit: Payer: Self-pay | Admitting: Internal Medicine

## 2016-04-15 NOTE — Telephone Encounter (Signed)
Spoke with wife.  Explained that he will go home from hospital with a 1 week supply or Tikosyn and that Jinny Blossom can talk to them tomorrow about Patient Assistance program.  She was also worried that he has a planned [facility] while in hospital, but he takes Eliquis and she understands that he should not stop Eliquis when starting Tikosyn.  Reviewed Dr. Rayann Heman note from 12/14, it states that Dr. Haroldine Laws is planning to do [place] on therapeutic Eliquis.

## 2016-04-15 NOTE — Telephone Encounter (Signed)
Spoke with patient's wife and let her know to continue the blood thinners

## 2016-04-15 NOTE — Telephone Encounter (Signed)
New message  Pt is going in hospital on 12/19 for 3 days  Pt needs to know if to stop blood thinner  Please call back and follow up

## 2016-04-16 ENCOUNTER — Encounter (HOSPITAL_COMMUNITY): Payer: Self-pay | Admitting: General Practice

## 2016-04-16 ENCOUNTER — Inpatient Hospital Stay (HOSPITAL_COMMUNITY)
Admission: AD | Admit: 2016-04-16 | Discharge: 2016-04-19 | DRG: 287 | Disposition: A | Discharge status: Home / Self Care | Payer: Medicare Other | Source: Ambulatory Visit | Attending: Internal Medicine | Admitting: Internal Medicine

## 2016-04-16 ENCOUNTER — Ambulatory Visit (INDEPENDENT_AMBULATORY_CARE_PROVIDER_SITE_OTHER): Payer: Medicare Other | Admitting: Pharmacist

## 2016-04-16 VITALS — Wt 211.0 lb

## 2016-04-16 DIAGNOSIS — K219 Gastro-esophageal reflux disease without esophagitis: Secondary | ICD-10-CM | POA: Diagnosis present

## 2016-04-16 DIAGNOSIS — I442 Atrioventricular block, complete: Secondary | ICD-10-CM

## 2016-04-16 DIAGNOSIS — Z950 Presence of cardiac pacemaker: Secondary | ICD-10-CM | POA: Diagnosis not present

## 2016-04-16 DIAGNOSIS — E785 Hyperlipidemia, unspecified: Secondary | ICD-10-CM | POA: Diagnosis present

## 2016-04-16 DIAGNOSIS — I350 Nonrheumatic aortic (valve) stenosis: Secondary | ICD-10-CM | POA: Diagnosis present

## 2016-04-16 DIAGNOSIS — J449 Chronic obstructive pulmonary disease, unspecified: Secondary | ICD-10-CM | POA: Diagnosis present

## 2016-04-16 DIAGNOSIS — I110 Hypertensive heart disease with heart failure: Secondary | ICD-10-CM | POA: Diagnosis present

## 2016-04-16 DIAGNOSIS — I4819 Other persistent atrial fibrillation: Secondary | ICD-10-CM

## 2016-04-16 DIAGNOSIS — M797 Fibromyalgia: Secondary | ICD-10-CM | POA: Diagnosis present

## 2016-04-16 DIAGNOSIS — Z881 Allergy status to other antibiotic agents status: Secondary | ICD-10-CM

## 2016-04-16 DIAGNOSIS — Z7901 Long term (current) use of anticoagulants: Secondary | ICD-10-CM

## 2016-04-16 DIAGNOSIS — I481 Persistent atrial fibrillation: Secondary | ICD-10-CM

## 2016-04-16 DIAGNOSIS — I5032 Chronic diastolic (congestive) heart failure: Secondary | ICD-10-CM | POA: Diagnosis present

## 2016-04-16 DIAGNOSIS — E669 Obesity, unspecified: Secondary | ICD-10-CM | POA: Diagnosis present

## 2016-04-16 DIAGNOSIS — E119 Type 2 diabetes mellitus without complications: Secondary | ICD-10-CM | POA: Diagnosis present

## 2016-04-16 DIAGNOSIS — I484 Atypical atrial flutter: Secondary | ICD-10-CM | POA: Diagnosis present

## 2016-04-16 DIAGNOSIS — G4733 Obstructive sleep apnea (adult) (pediatric): Secondary | ICD-10-CM | POA: Diagnosis present

## 2016-04-16 DIAGNOSIS — I272 Pulmonary hypertension, unspecified: Secondary | ICD-10-CM

## 2016-04-16 DIAGNOSIS — Z952 Presence of prosthetic heart valve: Secondary | ICD-10-CM

## 2016-04-16 DIAGNOSIS — G2581 Restless legs syndrome: Secondary | ICD-10-CM | POA: Diagnosis present

## 2016-04-16 DIAGNOSIS — Z923 Personal history of irradiation: Secondary | ICD-10-CM

## 2016-04-16 DIAGNOSIS — Z5181 Encounter for therapeutic drug level monitoring: Secondary | ICD-10-CM

## 2016-04-16 DIAGNOSIS — Z79899 Other long term (current) drug therapy: Secondary | ICD-10-CM

## 2016-04-16 DIAGNOSIS — I340 Nonrheumatic mitral (valve) insufficiency: Secondary | ICD-10-CM | POA: Diagnosis present

## 2016-04-16 DIAGNOSIS — E039 Hypothyroidism, unspecified: Secondary | ICD-10-CM | POA: Diagnosis present

## 2016-04-16 DIAGNOSIS — R0600 Dyspnea, unspecified: Secondary | ICD-10-CM

## 2016-04-16 DIAGNOSIS — J9611 Chronic respiratory failure with hypoxia: Secondary | ICD-10-CM | POA: Diagnosis present

## 2016-04-16 DIAGNOSIS — I2721 Secondary pulmonary arterial hypertension: Secondary | ICD-10-CM | POA: Diagnosis not present

## 2016-04-16 DIAGNOSIS — Z7951 Long term (current) use of inhaled steroids: Secondary | ICD-10-CM | POA: Diagnosis not present

## 2016-04-16 DIAGNOSIS — I480 Paroxysmal atrial fibrillation: Principal | ICD-10-CM | POA: Diagnosis present

## 2016-04-16 DIAGNOSIS — I4891 Unspecified atrial fibrillation: Secondary | ICD-10-CM

## 2016-04-16 DIAGNOSIS — I5022 Chronic systolic (congestive) heart failure: Secondary | ICD-10-CM | POA: Diagnosis not present

## 2016-04-16 DIAGNOSIS — Z9981 Dependence on supplemental oxygen: Secondary | ICD-10-CM

## 2016-04-16 DIAGNOSIS — R0602 Shortness of breath: Secondary | ICD-10-CM | POA: Diagnosis not present

## 2016-04-16 DIAGNOSIS — Z87891 Personal history of nicotine dependence: Secondary | ICD-10-CM

## 2016-04-16 DIAGNOSIS — I2510 Atherosclerotic heart disease of native coronary artery without angina pectoris: Secondary | ICD-10-CM | POA: Diagnosis present

## 2016-04-16 DIAGNOSIS — Z8249 Family history of ischemic heart disease and other diseases of the circulatory system: Secondary | ICD-10-CM

## 2016-04-16 HISTORY — DX: Encounter for therapeutic drug level monitoring: Z51.81

## 2016-04-16 HISTORY — DX: Other long term (current) drug therapy: Z79.899

## 2016-04-16 LAB — BASIC METABOLIC PANEL
Anion gap: 8 (ref 5–15)
BUN: 26 mg/dL — ABNORMAL HIGH (ref 6–20)
CO2: 31 mmol/L (ref 22–32)
Calcium: 9.6 mg/dL (ref 8.9–10.3)
Chloride: 98 mmol/L — ABNORMAL LOW (ref 101–111)
Creatinine, Ser: 1.77 mg/dL — ABNORMAL HIGH (ref 0.61–1.24)
GFR calc Af Amer: 44 mL/min — ABNORMAL LOW (ref >60)
GFR calc non Af Amer: 38 mL/min — ABNORMAL LOW (ref >60)
Glucose, Bld: 145 mg/dL — ABNORMAL HIGH (ref 65–99)
Potassium: 4.5 mmol/L (ref 3.5–5.1)
Sodium: 137 mmol/L (ref 135–145)

## 2016-04-16 LAB — MAGNESIUM: Magnesium: 2.1 mg/dL (ref 1.7–2.4)

## 2016-04-16 MED ORDER — LORATADINE 10 MG PO TABS
10.0000 mg | ORAL_TABLET | Freq: Every day | ORAL | Status: DC
  Administered 2016-04-17 – 2016-04-19 (×3): 10 mg via ORAL
  Filled 2016-04-16 (×3): qty 1

## 2016-04-16 MED ORDER — ROPINIROLE HCL 1 MG PO TABS
1.0000 mg | ORAL_TABLET | Freq: Every evening | ORAL | Status: DC
  Administered 2016-04-16 – 2016-04-18 (×3): 1 mg via ORAL
  Filled 2016-04-16 (×4): qty 1

## 2016-04-16 MED ORDER — APIXABAN 5 MG PO TABS
5.0000 mg | ORAL_TABLET | Freq: Two times a day (BID) | ORAL | Status: DC
  Administered 2016-04-16 – 2016-04-17 (×2): 5 mg via ORAL
  Filled 2016-04-16 (×2): qty 1

## 2016-04-16 MED ORDER — LEVOTHYROXINE SODIUM 75 MCG PO TABS
175.0000 ug | ORAL_TABLET | Freq: Every day | ORAL | Status: DC
Start: 2016-04-17 — End: 2016-04-19
  Administered 2016-04-17 – 2016-04-19 (×3): 175 ug via ORAL
  Filled 2016-04-16 (×3): qty 1

## 2016-04-16 MED ORDER — PANTOPRAZOLE SODIUM 40 MG PO TBEC
40.0000 mg | DELAYED_RELEASE_TABLET | Freq: Every day | ORAL | Status: DC
  Administered 2016-04-17 – 2016-04-19 (×3): 40 mg via ORAL
  Filled 2016-04-16 (×3): qty 1

## 2016-04-16 MED ORDER — DOFETILIDE 250 MCG PO CAPS
375.0000 ug | ORAL_CAPSULE | Freq: Two times a day (BID) | ORAL | Status: DC
  Administered 2016-04-16: 375 ug via ORAL
  Filled 2016-04-16 (×2): qty 1

## 2016-04-16 MED ORDER — ALBUTEROL SULFATE (2.5 MG/3ML) 0.083% IN NEBU: 2.5000 mg | INHALATION_SOLUTION | RESPIRATORY_TRACT | Status: DC | PRN

## 2016-04-16 MED ORDER — IPRATROPIUM BROMIDE 0.02 % IN SOLN: 0.5000 mg | Freq: Four times a day (QID) | RESPIRATORY_TRACT | Status: DC

## 2016-04-16 MED ORDER — GABAPENTIN 300 MG PO CAPS
900.0000 mg | ORAL_CAPSULE | Freq: Every day | ORAL | Status: DC
  Administered 2016-04-16 – 2016-04-18 (×3): 900 mg via ORAL
  Filled 2016-04-16 (×3): qty 3

## 2016-04-16 MED ORDER — METFORMIN HCL ER 500 MG PO TB24
500.0000 mg | ORAL_TABLET | Freq: Every day | ORAL | Status: DC
  Administered 2016-04-18 – 2016-04-19 (×2): 500 mg via ORAL
  Filled 2016-04-16 (×3): qty 1

## 2016-04-16 MED ORDER — ALBUTEROL SULFATE HFA 108 (90 BASE) MCG/ACT IN AERS: 2.0000 | INHALATION_SPRAY | Freq: Four times a day (QID) | RESPIRATORY_TRACT | Status: DC | PRN

## 2016-04-16 MED ORDER — DIPHENHYDRAMINE HCL 25 MG PO TABS: 25.0000 mg | ORAL_TABLET | Freq: Every day | ORAL | Status: DC

## 2016-04-16 MED ORDER — POTASSIUM CHLORIDE CRYS ER 20 MEQ PO TBCR
20.0000 meq | EXTENDED_RELEASE_TABLET | Freq: Every day | ORAL | Status: DC
  Administered 2016-04-17 – 2016-04-19 (×3): 20 meq via ORAL
  Filled 2016-04-16 (×3): qty 2
  Filled 2016-04-16: qty 1
  Filled 2016-04-16: qty 2

## 2016-04-16 MED ORDER — IPRATROPIUM BROMIDE 0.02 % IN SOLN
0.5000 mg | Freq: Four times a day (QID) | RESPIRATORY_TRACT | Status: DC
  Administered 2016-04-16: 0.5 mg via RESPIRATORY_TRACT
  Filled 2016-04-16 (×2): qty 2.5

## 2016-04-16 MED ORDER — SPIRONOLACTONE 25 MG PO TABS
25.0000 mg | ORAL_TABLET | Freq: Every day | ORAL | Status: DC
  Administered 2016-04-17 – 2016-04-19 (×3): 25 mg via ORAL
  Filled 2016-04-16 (×3): qty 1

## 2016-04-16 MED ORDER — GUAIFENESIN ER 600 MG PO TB12
600.0000 mg | ORAL_TABLET | Freq: Two times a day (BID) | ORAL | Status: DC
Start: 2016-04-16 — End: 2016-04-19
  Administered 2016-04-16 – 2016-04-19 (×6): 600 mg via ORAL
  Filled 2016-04-16 (×6): qty 1

## 2016-04-16 MED ORDER — TORSEMIDE 20 MG PO TABS
40.0000 mg | ORAL_TABLET | Freq: Every day | ORAL | Status: DC
  Administered 2016-04-17 – 2016-04-19 (×3): 40 mg via ORAL
  Filled 2016-04-16 (×3): qty 2

## 2016-04-16 MED ORDER — AMLODIPINE BESYLATE 5 MG PO TABS
5.0000 mg | ORAL_TABLET | Freq: Every day | ORAL | Status: DC
  Administered 2016-04-17 – 2016-04-19 (×3): 5 mg via ORAL
  Filled 2016-04-16 (×3): qty 1

## 2016-04-16 MED ORDER — SODIUM CHLORIDE 0.9% FLUSH
3.0000 mL | Freq: Two times a day (BID) | INTRAVENOUS | Status: DC
  Administered 2016-04-16 – 2016-04-17 (×2): 3 mL via INTRAVENOUS

## 2016-04-16 MED ORDER — METOPROLOL TARTRATE 50 MG PO TABS
50.0000 mg | ORAL_TABLET | Freq: Two times a day (BID) | ORAL | Status: DC
  Administered 2016-04-16 – 2016-04-19 (×6): 50 mg via ORAL
  Filled 2016-04-16 (×6): qty 1

## 2016-04-16 MED ORDER — SODIUM CHLORIDE 0.9% FLUSH: 3.0000 mL | INTRAVENOUS | Status: DC | PRN

## 2016-04-16 MED ORDER — SODIUM CHLORIDE 0.9 % IV SOLN: 250.0000 mL | INTRAVENOUS | Status: DC | PRN

## 2016-04-16 MED ORDER — VITAMIN D 1000 UNITS PO TABS
2000.0000 [IU] | ORAL_TABLET | Freq: Every day | ORAL | Status: DC
  Administered 2016-04-17 – 2016-04-19 (×3): 2000 [IU] via ORAL
  Filled 2016-04-16 (×4): qty 2

## 2016-04-16 MED ORDER — FLUTICASONE PROPIONATE 50 MCG/ACT NA SUSP
2.0000 | Freq: Two times a day (BID) | NASAL | Status: DC | PRN
  Filled 2016-04-16: qty 16

## 2016-04-16 MED ORDER — DOFETILIDE 250 MCG PO CAPS: 250.0000 ug | ORAL_CAPSULE | Freq: Two times a day (BID) | ORAL | Status: DC

## 2016-04-16 MED ORDER — BUDESONIDE 0.25 MG/2ML IN SUSP: 0.2500 mg | Freq: Two times a day (BID) | RESPIRATORY_TRACT | Status: DC

## 2016-04-16 MED ORDER — PROMETHAZINE HCL 25 MG PO TABS: 25.0000 mg | ORAL_TABLET | Freq: Four times a day (QID) | ORAL | Status: DC | PRN

## 2016-04-16 MED ORDER — MOMETASONE FURO-FORMOTEROL FUM 200-5 MCG/ACT IN AERO
2.0000 | INHALATION_SPRAY | Freq: Two times a day (BID) | RESPIRATORY_TRACT | Status: DC
  Administered 2016-04-17 – 2016-04-19 (×4): 2 via RESPIRATORY_TRACT
  Filled 2016-04-16: qty 8.8

## 2016-04-16 MED ORDER — TRAMADOL HCL 50 MG PO TABS
50.0000 mg | ORAL_TABLET | Freq: Every day | ORAL | Status: DC
  Administered 2016-04-16 – 2016-04-19 (×4): 50 mg via ORAL
  Filled 2016-04-16 (×4): qty 1

## 2016-04-16 MED ORDER — TRAZODONE HCL 50 MG PO TABS
25.0000 mg | ORAL_TABLET | Freq: Every day | ORAL | Status: DC
  Administered 2016-04-16 – 2016-04-18 (×3): 25 mg via ORAL
  Filled 2016-04-16 (×3): qty 1

## 2016-04-16 MED ORDER — ATORVASTATIN CALCIUM 40 MG PO TABS
40.0000 mg | ORAL_TABLET | Freq: Every day | ORAL | Status: DC
  Administered 2016-04-16 – 2016-04-18 (×3): 40 mg via ORAL
  Filled 2016-04-16 (×3): qty 1

## 2016-04-16 NOTE — Progress Notes (Signed)
Patient ID: [name]                 DOB: January 09, 1951                    MRN: [id number]     HPI: [name] is a 65 y.o. male patient of Dr. Rayann Heman who presents today for Tikosyn initiation. Pt has persistent afib and was recently cardioverted by Dr Haroldine Laws on 04/08/16. He has chronic SOB, fatigue, and edema in addition to pulmonary HTN for which he wears O2. S/p aortic valve replacement with LAA clip and MAZE in April 2017. Pt did not feel any better after cardioversion. Dr Rayann Heman and Dr Haroldine Laws discussed and decided to initiate pt on Tikosyn to maintain sinus rhythm. He will have RHC on therapeutic Eliquis during his hospital stay.  Pt educated on potential risks with Tikosyn including QTc prolongation. Pt is aware of the importance of not missing any doses and will call the office if they miss more than 2 doses in a row. Pt is anticoagulated with Eliquis and reports no missed doses within the past month. Pt is currently not taking any QTc prolongating or contraindicated medications. He stopped taking Benadryl last week and cut back on his trazodone to 25mg  HS.   EKG: NSR, vent rate 66bpm, QTc 541msec, pt has a pacer. Discussed with Dr Rayann Heman - ok for admit  Labs: Mg 2.1, K 4.5, SCr 1.77, weight, 211 lbs, CrCl = 55.68mL/min  Past Medical History:  Diagnosis Date  . Aortic stenosis, mild   . Arthritis of hand    "just a little bit in both hands" (03/31/2012)  . Asthma    "little bit" (03/31/2012)  . Atrial fibrillation [facility])    dx '04; DCCV '04, placed on flecainide, failed DCCV 04/2010, flecainide stopped; s/p successful A.fib ablation 01/31/12  . CHF (congestive heart failure) ([place])   . Diabetes mellitus without complication (HCC)    borderline  . DJD (degenerative joint disease)   . Exertional dyspnea   . Fibromyalgia   . First degree AV block    post ablation, ~473ms  . GERD (gastroesophageal reflux disease)   . Heart murmur   . Hyperlipidemia   . Hypertension     . Hypothyroidism    S/P radiation  . Left atrial enlargement    LA size 48mm by echo 11/21/11  . Mitral regurgitation    trivial  . Obesity   . Obstructive sleep apnea    mild by sleep study 2013; pt stated he does not have a machine because "it wasn't bad enough for him to have one"  . Pacemaker 03/31/2012  . Restless leg syndrome   . Second degree AV block     Current Outpatient Prescriptions on File Prior to Visit  Medication Sig Dispense Refill  . albuterol (PROVENTIL HFA;VENTOLIN HFA) 108 (90 Base) MCG/ACT inhaler Inhale 2 puffs into the lungs every 6 (six) hours as needed for wheezing or shortness of breath. 1 Inhaler 5  . amLODipine (NORVASC) 10 MG tablet Take 0.5 tablets (5 mg total) by mouth daily. 30 tablet 11  . apixaban (ELIQUIS) 5 MG TABS tablet Take 1 tablet (5 mg total) by mouth 2 (two) times daily. 30 tablet 3  . atorvastatin (LIPITOR) 40 MG tablet TAKE 1 TABLET(40 MG) BY MOUTH DAILY 90 tablet 0  . budesonide (PULMICORT) 0.25 MG/2ML nebulizer solution Take 2 mLs (0.25 mg total) by nebulization 2 (two) times daily. 360 mL  1  . budesonide-formoterol (SYMBICORT) 160-4.5 MCG/ACT inhaler Inhale 2 puffs into the lungs 2 (two) times daily. 1 Inhaler 6  . Cholecalciferol (VITAMIN D) 2000 UNITS CAPS Take 2,000 Units by mouth daily.    . diphenhydrAMINE (BENADRYL) 25 MG tablet Take 25 mg by mouth at bedtime.    . fexofenadine (ALLEGRA) 180 MG tablet Take 1 tablet (180 mg total) by mouth daily. 30 tablet 1  . fluticasone (FLONASE) 50 MCG/ACT nasal spray Place into both nostrils 2 (two) times daily as needed for allergies or rhinitis.    [name] gabapentin (NEURONTIN) 300 MG capsule Take 900 mg by mouth at bedtime.   3  . guaiFENesin (MUCINEX) 600 MG 12 hr tablet Take 600 mg by mouth 2 (two) times daily.    [name] ipratropium (ATROVENT) 0.02 % nebulizer solution Take 2.5 mLs (0.5 mg total) by nebulization 4 (four) times daily. 300 mL 1  . levalbuterol (XOPENEX) 0.63 MG/3ML nebulizer solution  Take 3 mLs (0.63 mg total) by nebulization every 6 (six) hours as needed for wheezing or shortness of breath. 360 mL 1  . levothyroxine (SYNTHROID, LEVOTHROID) 175 MCG tablet Take 175 mcg by mouth daily before breakfast.    . metFORMIN (GLUMETZA) 500 MG (MOD) 24 hr tablet Take 500 mg by mouth daily with breakfast.    . metoprolol (LOPRESSOR) 50 MG tablet Take 1 tablet (50 mg total) by mouth 2 (two) times daily. 60 tablet 11  . pantoprazole (PROTONIX) 40 MG tablet Take 40 mg by mouth daily.    . potassium chloride (K-DUR) 10 MEQ tablet Take 2 tablets (20 mEq total) by mouth daily. 60 tablet 6  . promethazine (PHENERGAN) 25 MG tablet Take 25 mg by mouth every 6 (six) hours as needed for nausea or vomiting.    [name] rOPINIRole (REQUIP) 1 MG tablet Take 1 tablet (1 mg total) by mouth daily. 90 tablet 2  . spironolactone (ALDACTONE) 25 MG tablet Take 1 tablet (25 mg total) by mouth daily. 90 tablet 3  . torsemide (DEMADEX) 20 MG tablet Take 2 tablets (40 mg total) by mouth daily. 60 tablet 6  . traMADol (ULTRAM) 50 MG tablet Take 50 mg by mouth daily. Reported on 10/17/2015    . traZODone (DESYREL) 50 MG tablet Take 50 mg by mouth at bedtime.    . valACYclovir (VALTREX) 1000 MG tablet Take 1 tablet by mouth every 12 (twelve) hours as needed (fever blisters). Reported on 10/17/2015     No current facility-administered medications on file prior to visit.     Allergies  Allergen Reactions  . Meloxicam Rash  . 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 syndrome 09/02/15, resolved with diphenhydramine and slowing of rate    Assessment/Plan:  1. Atrial fibrillation - EKG discussed with Dr Rayann Heman and pt ok for Tikosyn admit. K and Mg both at goal, pt has been anticoagulated with Eliquis with no missed doses. CrCL = 82mL/min, anticipated starting dose of Tikosyn is 245mcg BID. Pt aware to report to admitting.    Megan E. Supple, PharmD, CPP, [place] [id number]  [place], [place] [address] Phone: [phone]; Fax: [phone] 04/16/2016 1:27 PM

## 2016-04-16 NOTE — H&P (Addendum)
ELECTROPHYSIOLOGY CONSULT NOTE    Patient ID: [name] MRN: [id number], DOB/AGE: 1950-11-16 65 y.o.  Admit date: 04/16/2016 Date of Admission: 04/16/2016  Primary Physician: Velna Hatchet, MD Primary Cardiologist: Dr. Haroldine Laws Electrophysiologist: Dr. Rayann Heman  Reason for Admission: Tikosyn initaiation  HPI: [name] is a 65 y.o. male well known to me with persistent afib.  He has failed afib ablation.  He is s/p LAA clip and MAZE 4/17.  He recently developed recurrent atypical atrial flutter.  He underwent cardioversion.  Though he has maintained sinus since that time, he has not felt any better.  He has chronic lung disease as well as pulmonary hypertension.  Upon speaking with Dr Haroldine Laws last week, we decided to admit for tikosyn.  Dr Haroldine Laws will see and consider RHC this admission as well.  Past Medical History:  Diagnosis Date  . Aortic stenosis, mild   . Arthritis of hand    "just a little bit in both hands" (03/31/2012)  . Asthma    "little bit" (03/31/2012)  . Atrial fibrillation [facility])    dx '04; DCCV '04, placed on flecainide, failed DCCV 04/2010, flecainide stopped; s/p successful A.fib ablation 01/31/12  . CHF (congestive heart failure) ([place])   . Diabetes mellitus without complication (HCC)    borderline  . DJD (degenerative joint disease)   . Exertional dyspnea   . Fibromyalgia   . First degree AV block    post ablation, ~410ms  . GERD (gastroesophageal reflux disease)   . Heart murmur   . Hyperlipidemia   . Hypertension   . Hypothyroidism    S/P radiation  . Left atrial enlargement    LA size 63mm by echo 11/21/11  . Mitral regurgitation    trivial  . Obesity   . Obstructive sleep apnea    mild by sleep study 2013; pt stated he does not have a machine because "it wasn't bad enough for him to have one"  . Pacemaker 03/31/2012  . Restless leg syndrome   . Second degree AV block      Surgical History:  Past Surgical History:  Procedure  Laterality Date  . AORTIC VALVE REPLACEMENT N/A 08/22/2015   Procedure: AORTIC VALVE REPLACEMENT (AVR);  Surgeon: Ivin Poot, MD;  Location: [place];  Service: Open Heart Surgery;  Laterality: N/A;  . ATRIAL FIBRILLATION ABLATION  01/30/2012   PVI by Dr. Rayann Heman  . ATRIAL FIBRILLATION ABLATION N/A 01/31/2012   Procedure: ATRIAL FIBRILLATION ABLATION;  Surgeon: Thompson Grayer, MD;  [facility] C[facility] LAB;  Service: Cardiovascular;  Laterality: N/A;  . BACK SURGERY     X 3  . CARDIAC CATHETERIZATION N/A 08/09/2015   Procedure: Right/Left Heart Cath and Coronary Angiography;  Surgeon: Peter M Martinique, MD;  Location: [place] CV LAB;  Service: Cardiovascular;  Laterality: N/A;  . CARDIAC CATHETERIZATION N/A 02/05/2016   Procedure: Right/Left Heart Cath and Coronary Angiography;  Surgeon: Jettie Booze, MD;  Location: [place] CV LAB;  Service: Cardiovascular;  Laterality: N/A;  . CARDIOVASCULAR STRESS TEST  03/12/2002   EF 48%, NO EVIDENCE OF ISCHEMIA  . CARDIOVERSION  01/2012; 03/31/2012  . CARDIOVERSION N/A 02/25/2012   Procedure: CARDIOVERSION;  Surgeon: Thompson Grayer, MD;  Location: [facility] CATH LAB;  Service: Cardiovascular;  Laterality: N/A;  . CARDIOVERSION N/A 03/31/2012   Procedure: CARDIOVERSION;  Surgeon: Thompson Grayer, MD;  Location: [facility] CATH LAB;  Service: Cardiovascular;  Laterality: N/A;  . CARDIOVERSION N/A 11/23/2015   Procedure: CARDIOVERSION;  Surgeon:  Larey Dresser, MD;  Location: [place];  Service: Cardiovascular;  Laterality: N/A;  . CARDIOVERSION N/A 04/08/2016   Procedure: CARDIOVERSION;  Surgeon: Jolaine Artist, MD;  Location: [facility] ENDOSCOPY;  Service: Cardiovascular;  Laterality: N/A;  . CLIPPING OF ATRIAL APPENDAGE N/A 08/22/2015   Procedure: CLIPPING OF ATRIAL APPENDAGE;  Surgeon: Ivin Poot, MD;  Location: [place];  Service: Open Heart Surgery;  Laterality: N/A;  . DOPPLER ECHOCARDIOGRAPHY  03/11/2002   EF 70-75%  . FINGER SURGERY Left    Middle finger  .  FINGER TENDON REPAIR  1980's   "right little finger" (03/31/2012)  . INSERT / REPLACE / REMOVE PACEMAKER     [employer]  . LEFT AND RIGHT HEART CATHETERIZATION WITH CORONARY ANGIOGRAM N/A 10/27/2012   Procedure: LEFT AND RIGHT HEART CATHETERIZATION WITH CORONARY ANGIOGRAM;  Surgeon: Laverda Page, MD;  Location: [facility] CATH LAB;  Service: Cardiovascular;  Laterality: N/A;  . LUMBAR DISC SURGERY  1980's X2;  2000's  . MAZE N/A 08/22/2015   Procedure: MAZE;  Surgeon: Ivin Poot, MD;  Location: [place];  Service: Open Heart Surgery;  Laterality: N/A;  . PACEMAKER INSERTION  03/31/2012   STJ Accent DR pacemaker implanted by Dr Rayann Heman  . PERMANENT PACEMAKER INSERTION N/A 03/31/2012   Procedure: PERMANENT PACEMAKER INSERTION;  Surgeon: Thompson Grayer, MD;  Location: [facility] CATH LAB;  Service: Cardiovascular;  Laterality: N/A;  . TEE WITHOUT CARDIOVERSION  01/30/2012   Procedure: TRANSESOPHAGEAL ECHOCARDIOGRAM (TEE);  Surgeon: Larey Dresser, MD;  Location: [place];  Service: Cardiovascular;  Laterality: N/A;  ablation next day  . TEE WITHOUT CARDIOVERSION N/A 08/22/2015   Procedure: TRANSESOPHAGEAL ECHOCARDIOGRAM (TEE);  Surgeon: Ivin Poot, MD;  Location: [place];  Service: Open Heart Surgery;  Laterality: N/A;  . US ECHOCARDIOGRAPHY  08/07/2009   EF 55-60%     Prescriptions Prior to Admission  Medication Sig Dispense Refill Last Dose  . albuterol (PROVENTIL HFA;VENTOLIN HFA) 108 (90 Base) MCG/ACT inhaler Inhale 2 puffs into the lungs every 6 (six) hours as needed for wheezing or shortness of breath. 1 Inhaler 5 Taking  . amLODipine (NORVASC) 10 MG tablet Take 0.5 tablets (5 mg total) by mouth daily. 30 tablet 11 Taking  . apixaban (ELIQUIS) 5 MG TABS tablet Take 1 tablet (5 mg total) by mouth 2 (two) times daily. 30 tablet 3 Taking  . atorvastatin (LIPITOR) 40 MG tablet TAKE 1 TABLET(40 MG) BY MOUTH DAILY 90 tablet 0 Taking  . budesonide (PULMICORT) 0.25 MG/2ML nebulizer solution Take 2 mLs (0.25 mg  total) by nebulization 2 (two) times daily. 360 mL 1 Taking  . budesonide-formoterol (SYMBICORT) 160-4.5 MCG/ACT inhaler Inhale 2 puffs into the lungs 2 (two) times daily. 1 Inhaler 6 Taking  . Cholecalciferol (VITAMIN D) 2000 UNITS CAPS Take 2,000 Units by mouth daily.   Taking  . diphenhydrAMINE (BENADRYL) 25 MG tablet Take 25 mg by mouth at bedtime.   Taking  . fexofenadine (ALLEGRA) 180 MG tablet Take 1 tablet (180 mg total) by mouth daily. 30 tablet 1 Taking  . fluticasone (FLONASE) 50 MCG/ACT nasal spray Place into both nostrils 2 (two) times daily as needed for allergies or rhinitis.   Taking  . gabapentin (NEURONTIN) 300 MG capsule Take 900 mg by mouth at bedtime.   3 Taking  . guaiFENesin (MUCINEX) 600 MG 12 hr tablet Take 600 mg by mouth 2 (two) times daily.   Taking  . ipratropium (ATROVENT) 0.02 % nebulizer solution Take 2.5  mLs (0.5 mg total) by nebulization 4 (four) times daily. 300 mL 1 Taking  . levalbuterol (XOPENEX) 0.63 MG/3ML nebulizer solution Take 3 mLs (0.63 mg total) by nebulization every 6 (six) hours as needed for wheezing or shortness of breath. 360 mL 1 Taking  . levothyroxine (SYNTHROID, LEVOTHROID) 175 MCG tablet Take 175 mcg by mouth daily before breakfast.   Taking  . metFORMIN (GLUMETZA) 500 MG (MOD) 24 hr tablet Take 500 mg by mouth daily with breakfast.   Taking  . metoprolol (LOPRESSOR) 50 MG tablet Take 1 tablet (50 mg total) by mouth 2 (two) times daily. 60 tablet 11 Taking  . pantoprazole (PROTONIX) 40 MG tablet Take 40 mg by mouth daily.   Taking  . potassium chloride (K-DUR) 10 MEQ tablet Take 2 tablets (20 mEq total) by mouth daily. 60 tablet 6 Taking  . promethazine (PHENERGAN) 25 MG tablet Take 25 mg by mouth every 6 (six) hours as needed for nausea or vomiting.   Taking  . rOPINIRole (REQUIP) 1 MG tablet Take 1 tablet (1 mg total) by mouth daily. 90 tablet 2 Taking  . spironolactone (ALDACTONE) 25 MG tablet Take 1 tablet (25 mg total) by mouth daily. 90  tablet 3 Taking  . torsemide (DEMADEX) 20 MG tablet Take 2 tablets (40 mg total) by mouth daily. 60 tablet 6 Taking  . traMADol (ULTRAM) 50 MG tablet Take 50 mg by mouth daily. Reported on 10/17/2015   Taking  . traZODone (DESYREL) 50 MG tablet Take 50 mg by mouth at bedtime.   Taking  . valACYclovir (VALTREX) 1000 MG tablet Take 1 tablet by mouth every 12 (twelve) hours as needed (fever blisters). Reported on 10/17/2015   Taking    Inpatient Medications:   Allergies:  Allergies  Allergen Reactions  . Meloxicam Rash  . 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 syndrome 09/02/15, resolved with diphenhydramine and slowing of rate    Social History   Social History  . Marital status: Married    Spouse name: N/A  . Number of children: N/A  . Years of education: N/A   Occupational History  . Not on file.   Social History Main Topics  . Smoking status: Former Smoker    Packs/day: 0.50    Years: 15.00    Types: Cigarettes    Quit date: 07/18/1991  . Smokeless tobacco: Never Used  . Alcohol use Yes     Comment: occasional  . Drug use:     Types: Marijuana  . Sexual activity: Not Currently   Other Topics Concern  . Not on file   Social History Narrative   Lives in [place] with spouse.  Works as a Air traffic controller.     Family History  Problem Relation Age of Onset  . Heart failure Mother   . Stroke Father      Review of Systems:  All other systems reviewed and are otherwise negative except as noted above.  Physical Exam: Vitals:   04/16/16 1527  BP: 140/64  Pulse: 68  Temp: 97.5 F (36.4 C)  TempSrc: Oral  SpO2: 98%    GEN- The patient is well appearing, alert and oriented x 3 today.   HEENT: normocephalic, atraumatic; sclera clear, conjunctiva pink; hearing intact; oropharynx clear; neck supple,   Lungs- Clear to ausculation bilaterally, normal work of breathing.  No wheezes, rales, rhonchi Heart- Regular rate and rhythm (paced) GI- soft,  non-tender, non-distended, bowel sounds present Extremities- no clubbing, cyanosis,  or edema; DP/PT/radial pulses 2+ bilaterally MS- no significant deformity or atrophy Skin- warm and dry, no rash or lesion Psych- euthymic mood, full affect Neuro- no gross deficits observed  Labs:   Lab Results  Component Value Date   WBC 8.7 02/06/2016   HGB 9.0 (L) 02/06/2016   HCT 31.9 (L) 02/06/2016   MCV 84.2 02/06/2016   PLT 234 02/06/2016    Recent Labs Lab 04/16/16 1207  NA 137  K 4.5  CL 98*  CO2 31  BUN 26*  CREATININE 1.77*  CALCIUM 9.6  GLUCOSE 145*   EKG: SR w/Vpaced rhythm, QRS 125ms, QTc 511 TELEMETRY: sinus    Assessment and Plan:   1. PAFib, Tikosyn initiation     CHA2Ds2vasc is 2, on Eliquis      K+ 4.5     Mag 2.1     Creat 1.77, calc creat Cl is 56 ( < 60, 384mcg dose)     QTc acceptable given wide QRS  2. Heart block w/PPM     SJM, implanted 2013  3. HTN     Stable  Signed, Tommye Standard, PA-C 04/16/2016 3:59 PM  I have seen, examined the patient, and reviewed the above assessment and plan.  On exam, RRR. Changes to above are made where necessary.  Paced Qt is 500 msec.  Will follow closely with initiation of tikosyn.    Co Sign: Thompson Grayer, MD 04/16/2016 4:24 PM

## 2016-04-16 NOTE — Progress Notes (Signed)
Pt has arrived as a direct admit for tikosyn administration. MD, admissions nurse, and pharmacy tech notified about pt's arrival. Telemetry box applied and CCMD notified. Assessment completed. EKG completed. Pt oriented to room. Dinner tray ordered. Pt resting in room with call light within reach. Will follow plan of care.  Grant Fontana BSN, RN

## 2016-04-16 NOTE — Progress Notes (Signed)
Pharmacy Review for Dofetilide (Tikosyn) Initiation   Admit Complaint: 65 y.o. male admitted 04/16/2016 with atrial fibrillation to be initiated on dofetilide.   Assessment:  Patient Exclusion Criteria: If any screening criteria checked as "Yes", then  patient  should NOT receive dofetilide until criteria item is corrected. If "Yes" please indicate correction plan.  YES  NO Patient  Exclusion Criteria Correction Plan  [x]  []  Baseline QTc interval is greater than or equal to 440 msec. IF above YES box checked dofetilide contraindicated unless patient has ICD; then may proceed if QTc 500-550 msec or with known ventricular conduction abnormalities may proceed with QTc 550-600 msec. QTc = 511 EP aware and is okay with QTc given wide QRS and patient does have a pacer.  []  [x]  Magnesium level is less than 1.8 mEq/l : Last magnesium:  Lab Results  Component Value Date   MG 2.1 04/16/2016         []  [x]  Potassium level is less than 4 mEq/l : Last potassium:  Lab Results  Component Value Date   K 4.5 04/16/2016         []  [x]  Patient is known or suspected to have a digoxin level greater than 2 ng/ml: No results found for: DIGOXIN    []  [x]  Creatinine clearance less than 20 ml/min (calculated using Cockcroft-Gault, actual body weight and serum creatinine): Estimated Creatinine Clearance: 48.5 mL/min (by C-G formula based on SCr of 1.77 mg/dL (H)).    []  [x]  Patient has received drugs known to prolong the QT intervals within the last 48 hours (phenothiazines, tricyclics or tetracyclic antidepressants, erythromycin, H-1 antihistamines, cisapride, fluoroquinolones, azithromycin). Drugs not listed above may have an, as yet, undetected potential to prolong the QT interval, updated information on QT prolonging agents is available at this website:QT prolonging agents   []  [x]  Patient received a dose of hydrochlorothiazide (Oretic) alone or in any combination including triamterene (Dyazide, Maxzide)  in the last 48 hours.   []  [x]  Patient received a medication known to increase dofetilide plasma concentrations prior to initial dofetilide dose:   . Trimethoprim (Primsol, Proloprim) in the last 36 hours . Verapamil (Calan, Verelan) in the last 36 hours or a sustained release dose in the last 72 hours . Megestrol (Megace) in the last 5 days  . Cimetidine (Tagamet) in the last 6 hours . Ketoconazole (Nizoral) in the last 24 hours . Itraconazole (Sporanox) in the last 48 hours  . Prochlorperazine (Compazine) in the last 36 hours    []  [x]  Patient is known to have a history of torsades de pointes; congenital or acquired long QT syndromes.   []  [x]  Patient has received a Class 1 antiarrhythmic with less than 2 half-lives since last dose. (Disopyramide, Quinidine, Procainamide, Lidocaine, Mexiletine, Flecainide, Propafenone)   []  [x]  Patient has received amiodarone therapy in the past 3 months or amiodarone level is greater than 0.3 ng/ml.    Patient has been appropriately anticoagulated with Eliquis.  Ordering provider was confirmed at [email or web address] if they are not listed on the [place] Prescribers list.  Goal of Therapy: Follow renal function, electrolytes, potential drug interactions, and dose adjustment. Provide education and 1 week supply at discharge.  Plan:  []   Physician selected initial dose within range recommended for patients level of renal function - will monitor for response.  [x]   Physician selected initial dose outside of range recommended for patients level of renal function - will discuss if the dose should be altered at this time.  Select One Calculated CrCl  Dose q12h  []  > 60 ml/min 500 mcg  [x]  40-60 ml/min 250 mcg  []  20-40 ml/min 125 mcg    Paged PA and discussed dose. Initially was going to start 271mcg BID, but EP ok with starting 350mcg.  2. Follow up QTc after the first 5 doses, renal function, electrolytes (K & Mg) daily x 3     days, dose  adjustment, success of initiation and facilitate 1 week discharge supply as     clinically indicated.  3. Initiate Tikosyn education video (Call [phone] and ask for Tikosyn Video # 116).  4. Place Enrollment Form on the chart for discharge supply of dofetilide.  Elenor Quinones, PharmD, BCPS Clinical Pharmacist Pager [phone] 04/16/2016 4:27 PM

## 2016-04-17 ENCOUNTER — Encounter (HOSPITAL_COMMUNITY)
Admission: AD | Disposition: A | Discharge status: Home / Self Care | Payer: Self-pay | Source: Ambulatory Visit | Attending: Internal Medicine

## 2016-04-17 DIAGNOSIS — I2721 Secondary pulmonary arterial hypertension: Secondary | ICD-10-CM

## 2016-04-17 DIAGNOSIS — I5022 Chronic systolic (congestive) heart failure: Secondary | ICD-10-CM

## 2016-04-17 DIAGNOSIS — I5032 Chronic diastolic (congestive) heart failure: Secondary | ICD-10-CM

## 2016-04-17 HISTORY — PX: CARDIAC CATHETERIZATION: SHX172

## 2016-04-17 LAB — POCT I-STAT 3, ART BLOOD GAS (G3+)
Acid-Base Excess: 5 mmol/L — ABNORMAL HIGH (ref 0.0–2.0)
Acid-base deficit: 7 mmol/L — ABNORMAL HIGH (ref 0.0–2.0)
Bicarbonate: 19.3 mmol/L — ABNORMAL LOW (ref 20.0–28.0)
Bicarbonate: 30.1 mmol/L — ABNORMAL HIGH (ref 20.0–28.0)
O2 Saturation: 43 %
O2 Saturation: 87 %
TCO2: 20 mmol/L (ref 0–100)
TCO2: 31 mmol/L (ref 0–100)
pCO2 arterial: 38.5 mmHg (ref 32.0–48.0)
pCO2 arterial: 44.1 mmHg (ref 32.0–48.0)
pH, Arterial: 7.308 — ABNORMAL LOW (ref 7.350–7.450)
pH, Arterial: 7.442 (ref 7.350–7.450)
pO2, Arterial: 23 mmHg — CL (ref 83.0–108.0)
pO2, Arterial: 58 mmHg — ABNORMAL LOW (ref 83.0–108.0)

## 2016-04-17 LAB — POCT I-STAT 3, VENOUS BLOOD GAS (G3P V)
Acid-Base Excess: 3 mmol/L — ABNORMAL HIGH (ref 0.0–2.0)
Acid-Base Excess: 3 mmol/L — ABNORMAL HIGH (ref 0.0–2.0)
Acid-Base Excess: 4 mmol/L — ABNORMAL HIGH (ref 0.0–2.0)
Bicarbonate: 28 mmol/L (ref 20.0–28.0)
Bicarbonate: 28.1 mmol/L — ABNORMAL HIGH (ref 20.0–28.0)
Bicarbonate: 28.6 mmol/L — ABNORMAL HIGH (ref 20.0–28.0)
O2 Saturation: 42 %
O2 Saturation: 43 %
O2 Saturation: 50 %
TCO2: 29 mmol/L (ref 0–100)
TCO2: 29 mmol/L (ref 0–100)
TCO2: 30 mmol/L (ref 0–100)
pCO2, Ven: 42 mmHg — ABNORMAL LOW (ref 44.0–60.0)
pCO2, Ven: 42.5 mmHg — ABNORMAL LOW (ref 44.0–60.0)
pCO2, Ven: 43.2 mmHg — ABNORMAL LOW (ref 44.0–60.0)
pH, Ven: 7.429 (ref 7.250–7.430)
pH, Ven: 7.429 (ref 7.250–7.430)
pH, Ven: 7.431 — ABNORMAL HIGH (ref 7.250–7.430)
pO2, Ven: 23 mmHg — CL (ref 32.0–45.0)
pO2, Ven: 24 mmHg — CL (ref 32.0–45.0)
pO2, Ven: 26 mmHg — CL (ref 32.0–45.0)

## 2016-04-17 LAB — CBC
HCT: 30.4 % — ABNORMAL LOW (ref 39.0–52.0)
Hemoglobin: 9 g/dL — ABNORMAL LOW (ref 13.0–17.0)
MCH: 23.7 pg — ABNORMAL LOW (ref 26.0–34.0)
MCHC: 29.6 g/dL — ABNORMAL LOW (ref 30.0–36.0)
MCV: 80 fL (ref 78.0–100.0)
Platelets: 190 10*3/uL (ref 150–400)
RBC: 3.8 MIL/uL — ABNORMAL LOW (ref 4.22–5.81)
RDW: 18.6 % — ABNORMAL HIGH (ref 11.5–15.5)
WBC: 7.1 10*3/uL (ref 4.0–10.5)

## 2016-04-17 LAB — BASIC METABOLIC PANEL
Anion gap: 9 (ref 5–15)
BUN: 28 mg/dL — ABNORMAL HIGH (ref 6–20)
CO2: 28 mmol/L (ref 22–32)
Calcium: 9.1 mg/dL (ref 8.9–10.3)
Chloride: 98 mmol/L — ABNORMAL LOW (ref 101–111)
Creatinine, Ser: 1.82 mg/dL — ABNORMAL HIGH (ref 0.61–1.24)
GFR calc Af Amer: 43 mL/min — ABNORMAL LOW (ref >60)
GFR calc non Af Amer: 37 mL/min — ABNORMAL LOW (ref >60)
Glucose, Bld: 127 mg/dL — ABNORMAL HIGH (ref 65–99)
Potassium: 4.1 mmol/L (ref 3.5–5.1)
Sodium: 135 mmol/L (ref 135–145)

## 2016-04-17 LAB — MAGNESIUM: Magnesium: 2 mg/dL (ref 1.7–2.4)

## 2016-04-17 SURGERY — RIGHT HEART CATH

## 2016-04-17 MED ORDER — SODIUM CHLORIDE 0.9 % IV SOLN: 250.0000 mL | INTRAVENOUS | Status: DC | PRN

## 2016-04-17 MED ORDER — LIDOCAINE HCL (PF) 1 % IJ SOLN
INTRAMUSCULAR | Status: DC | PRN
Start: 2016-04-17 — End: 2016-04-17
  Administered 2016-04-17: 2 mL

## 2016-04-17 MED ORDER — DOFETILIDE 250 MCG PO CAPS
250.0000 ug | ORAL_CAPSULE | Freq: Two times a day (BID) | ORAL | Status: DC
  Administered 2016-04-17 – 2016-04-19 (×4): 250 ug via ORAL
  Filled 2016-04-17 (×4): qty 1

## 2016-04-17 MED ORDER — SODIUM CHLORIDE 0.9% FLUSH: 3.0000 mL | INTRAVENOUS | Status: DC | PRN

## 2016-04-17 MED ORDER — SODIUM CHLORIDE 0.9% FLUSH
3.0000 mL | Freq: Two times a day (BID) | INTRAVENOUS | Status: DC
  Administered 2016-04-17: 3 mL via INTRAVENOUS

## 2016-04-17 MED ORDER — SODIUM CHLORIDE 0.9% FLUSH
3.0000 mL | Freq: Two times a day (BID) | INTRAVENOUS | Status: DC
  Administered 2016-04-17 – 2016-04-18 (×3): 3 mL via INTRAVENOUS

## 2016-04-17 MED ORDER — APIXABAN 5 MG PO TABS
5.0000 mg | ORAL_TABLET | Freq: Two times a day (BID) | ORAL | Status: DC
  Administered 2016-04-17 – 2016-04-19 (×4): 5 mg via ORAL
  Filled 2016-04-17 (×4): qty 1

## 2016-04-17 MED ORDER — HEPARIN (PORCINE) IN NACL 2-0.9 UNIT/ML-% IJ SOLN
INTRAMUSCULAR | Status: DC | PRN
  Administered 2016-04-17: 500 mL

## 2016-04-17 MED ORDER — HEPARIN (PORCINE) IN NACL 2-0.9 UNIT/ML-% IJ SOLN
INTRAMUSCULAR | Status: AC
  Filled 2016-04-17: qty 500

## 2016-04-17 MED ORDER — SODIUM CHLORIDE 0.9% FLUSH: 3.0000 mL | Freq: Two times a day (BID) | INTRAVENOUS | Status: DC

## 2016-04-17 MED ORDER — SODIUM CHLORIDE 0.9 % IV SOLN: INTRAVENOUS | Status: DC

## 2016-04-17 MED ORDER — ONDANSETRON HCL 4 MG/2ML IJ SOLN: 4.0000 mg | Freq: Four times a day (QID) | INTRAMUSCULAR | Status: DC | PRN

## 2016-04-17 MED ORDER — DOFETILIDE 250 MCG PO CAPS
375.0000 ug | ORAL_CAPSULE | Freq: Two times a day (BID) | ORAL | Status: DC
  Administered 2016-04-17: 375 ug via ORAL
  Filled 2016-04-17: qty 1

## 2016-04-17 MED ORDER — LIDOCAINE HCL (PF) 1 % IJ SOLN
INTRAMUSCULAR | Status: AC
  Filled 2016-04-17: qty 30

## 2016-04-17 MED ORDER — ACETAMINOPHEN 325 MG PO TABS
650.0000 mg | ORAL_TABLET | ORAL | Status: DC | PRN
  Administered 2016-04-18: 650 mg via ORAL
  Filled 2016-04-17: qty 2

## 2016-04-17 SURGICAL SUPPLY — 10 items
CATH BALLN WEDGE 5F 110CM (CATHETERS) ×2 IMPLANT
NEEDLE PERC 18GX4CM (NEEDLE) ×2 IMPLANT
NEEDLE PERC ENTRY 21G 2.5CM (NEEDLE) ×2 IMPLANT
PACK CARDIAC CATHETERIZATION (CUSTOM PROCEDURE TRAY) ×2 IMPLANT
SHEATH FAST CATH BRACH 5F 5CM (SHEATH) ×2 IMPLANT
TRANSDUCER W/STOPCOCK (MISCELLANEOUS) ×2 IMPLANT
TUBING ART PRESS 72  MALE/FEM (TUBING) ×1
TUBING ART PRESS 72 MALE/FEM (TUBING) ×1 IMPLANT
TUBING CIL FLEX 10 FLL-RA (TUBING) ×2 IMPLANT
WIRE EMERALD 3MM-J .025X260CM (WIRE) ×2 IMPLANT

## 2016-04-17 NOTE — Progress Notes (Signed)
Insurance check completed for [employer]  @ [place] # [id number].TIKOSYN  125 MG  BID   COVER- NONE FORMULARY/NOT COVER - (Brand name) PRIOR APPROVAL- YES [phone]   DOFETILIDE 125 MG BID  (generic covered)  COVER- YES  CO-PAY-$ 95.00  TIER- 4 DRUG  PRIOR APPROVAL- NO  PHARMACY : [facility]  MAIL-ORDER FOR 90 DAY SUPPLY $ 275.00

## 2016-04-17 NOTE — Interval H&P Note (Signed)
History and Physical Interval Note:  04/17/2016 4:04 PM  [name]  has presented today for surgery, with the diagnosis of HF  The various methods of treatment have been discussed with the patient and family. After consideration of risks, benefits and other options for treatment, the patient has consented to  Procedure(s): Right Heart Cath (N/A) as a surgical intervention .  The patient's history has been reviewed, patient examined, no change in status, stable for surgery.  I have reviewed the patient's chart and labs.  Questions were answered to the patient's satisfaction.     Bensimhon, Quillian Quince

## 2016-04-17 NOTE — Progress Notes (Signed)
SUBJECTIVE: The patient is doing well today.  At this time, he denies chest pain, shortness of breath, or any new concerns.  [name] amLODipine  5 mg Oral Daily  . apixaban  5 mg Oral BID  . atorvastatin  40 mg Oral q1800  . cholecalciferol  2,000 Units Oral Daily  . dofetilide  375 mcg Oral BID  . gabapentin  900 mg Oral QHS  . guaiFENesin  600 mg Oral BID  . ipratropium  0.5 mg Nebulization QID  . levothyroxine  175 mcg Oral QAC breakfast  . loratadine  10 mg Oral Daily  . metFORMIN  500 mg Oral Q breakfast  . metoprolol  50 mg Oral BID  . mometasone-formoterol  2 puff Inhalation BID  . pantoprazole  40 mg Oral Daily  . potassium chloride  20 mEq Oral Daily  . rOPINIRole  1 mg Oral QPM  . sodium chloride flush  3 mL Intravenous Q12H  . spironolactone  25 mg Oral Daily  . torsemide  40 mg Oral Daily  . traMADol  50 mg Oral Daily  . traZODone  25 mg Oral QHS     OBJECTIVE: Physical Exam: Vitals:   04/16/16 1627 04/16/16 1958 04/16/16 2042 04/17/16 0442  BP:  (!) 127/56  (!) 129/59  Pulse:  100  64  Resp:  20  20  Temp:  97.7 F (36.5 C)  97.7 F (36.5 C)  TempSrc:  Oral  Oral  SpO2:  100% 99% 100%  Weight: 209 lb 9.6 oz (95.1 kg)     Height: 5\' 11"  (1.803 m)       Intake/Output Summary (Last 24 hours) at 04/17/16 [id number] Last data filed at 04/16/16 1700  Gross per 24 hour  Intake              240 ml  Output                0 ml  Net              240 ml    Telemetry reveals SR/V paced, occ AV paced  GEN- The patient is well appearing, alert and oriented x 3 today.   Head- normocephalic, atraumatic Eyes-  Sclera clear, conjunctiva pink Ears- hearing intact Oropharynx- clear Neck- supple, no JVP Lungs- Clear to ausculation bilaterally, normal work of breathing Heart- Regular rate and rhythm, no significant murmurs, no rubs or gallops GI- soft, NT, ND Extremities- no clubbing, cyanosis, or edema Skin- no rash or lesion Psych- euthymic mood, full affect Neuro-  no gross deficits appreciated  LABS: Basic Metabolic Panel:  Recent Labs  04/16/16 1207 04/17/16 0255  NA 137 135  K 4.5 4.1  CL 98* 98*  CO2 31 28  GLUCOSE 145* 127*  BUN 26* 28*  CREATININE 1.77* 1.82*  CALCIUM 9.6 9.1  MG 2.1 2.0    ASSESSMENT AND PLAN:   1. PAFib, Tikosyn initiation     CHA2Ds2vasc is 2, on Eliquis      K+ 4.1     Mag 2.0     Creat 1.82, calc creat Cl is 54      QTc given wide QRS acceptable, though a out some from yesterday, though will down-titrate dose to 271mcg today  2. Heart block w/PPM     SJM, implanted 2013  3. HTN     Stable  4. Chronic CHF (diastolic)     Possible RHD during admission     Will defer  to Dr. Haroldine Laws  5. COPD     At his baseline     Continue home regime  6. Hx AVR (biopprosthetic)  Tommye Standard, PA-C 04/17/2016 8:29 AM  I have seen, examined the patient, and reviewed the above assessment and plan.  On exam, RRR.  QT is a little long.  Will reduce tikosyn to 250 mcg BID. Changes to above are made where necessary.    Co Sign: Thompson Grayer, MD 04/17/2016 2:49 PM

## 2016-04-17 NOTE — Consult Note (Signed)
Advanced Heart Failure Team Consult Note  Referring Physician: Dr. Rayann Heman Primary Physician: Dr. Ardeth Perfect Primary Cardiologist:  Dr. Haroldine Laws  EP: Dr. Rayann Heman  Reason for Consultation: Afib/SOB  HPI:    [name] is a 65 y.o. 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   Last seen in HF clininc 04/04/16. Had been feeling worse and more SOB. Very fatigued, sleeping in recliner. ICD interrogation significant for on-going Aflutter/tach. AF burden > 20% x 3 months. Scheduled for DCCV as below. Also referred back to see EP.  Pt s/p DCCV 04/08/16 with conversion to Sinus Rhythm. Despite his SOB symptoms persisted, and per patient may have even gotten worse.  Seen by Dr Rayann Heman and discussed Tikosyn.  Pt agreed to and was admitted 04/16/16 for Tikosyn load.  With worsening SOB over past several months, it was also decided to repeat RHC this admission.  Eliquis to be continued without break due to recent DCCV.   Feeling OK today. Remains SOB with activity. Still having some degree of Fatigue. Pt cannot tell when his heart is out of rhythm. Weights relatively stable. Fine at rest.  Willing to undergo RHC this admission.    Review of Systems: [y] = yes, [ ]  = no   General: Weight gain [ ] ; Weight loss [ ] ; Anorexia [ ] ; Fatigue [y]; Fever [ ] ; Chills [ ] ; Weakness [ ]   Cardiac: Chest pain/pressure [ ] ; Resting SOB [ ] ; Exertional SOB [y]; Orthopnea [ ] ; Pedal Edema [ ] ; Palpitations [ ] ; Syncope [ ] ; Presyncope [ ] ; Paroxysmal nocturnal dyspnea[ ]   Pulmonary: Cough [ ] ; Wheezing[ ] ; Hemoptysis[ ] ; Sputum [ ] ; Snoring [ ]   GI: Vomiting[ ] ; Dysphagia[ ] ; Melena[ ] ; Hematochezia [ ] ; Heartburn[ ] ; Abdominal pain [ ] ; Constipation [ ] ; Diarrhea [ ] ; BRBPR [ ]   GU: Hematuria[ ] ; Dysuria [ ] ; Nocturia[ ]   Vascular: Pain in legs with walking [ ] ; Pain in feet with lying  flat [ ] ; Non-healing sores [ ] ; Stroke [ ] ; TIA [ ] ; Slurred speech [ ] ;  Neuro: Headaches[ ] ; Vertigo[ ] ; Seizures[ ] ; Paresthesias[ ] ;Blurred vision [ ] ; Diplopia [ ] ; Vision changes [ ]   Ortho/Skin: Arthritis [y]; Joint pain [y]; Muscle pain [ ] ; Joint swelling [ ] ; Back Pain [ ] ; Rash [ ]   Psych: Depression[ ] ; Anxiety[ ]   Heme: Bleeding problems [ ] ; Clotting disorders [ ] ; Anemia [ ]   Endocrine: Diabetes [ ] ; Thyroid dysfunction[ ]   Home Medications Prior to Admission medications   Medication Sig Start Date End Date Taking? Authorizing Provider  albuterol (PROVENTIL HFA;VENTOLIN HFA) 108 (90 Base) MCG/ACT inhaler Inhale 2 puffs into the lungs every 6 (six) hours as needed for wheezing or shortness of breath. 12/28/15  Yes Collene Gobble, MD  amLODipine (NORVASC) 10 MG tablet Take 0.5 tablets (5 mg total) by mouth daily. 09/18/15  Yes Thayer Headings, MD  apixaban (ELIQUIS) 5 MG TABS tablet Take 1 tablet (5 mg total) by mouth 2 (two) times daily. 04/04/16  Yes Shaune Pascal Bensimhon, MD  atorvastatin (LIPITOR) 40 MG tablet TAKE 1 TABLET(40 MG) BY MOUTH DAILY 01/31/16  Yes Thayer Headings, MD  budesonide-formoterol [facility]) 160-4.5 MCG/ACT inhaler Inhale 2 puffs into the lungs 2 (two) times daily. 11/13/15  Yes Collene Gobble, MD  Cholecalciferol (VITAMIN D) 2000 UNITS CAPS Take  2,000 Units by mouth daily.   Yes Historical Provider, MD  fexofenadine (ALLEGRA) 180 MG tablet Take 1 tablet (180 mg total) by mouth daily. 03/01/14  Yes Collene Gobble, MD  fluticasone (FLONASE) 50 MCG/ACT nasal spray Place into both nostrils 2 (two) times daily as needed for allergies or rhinitis.   Yes Historical Provider, MD  gabapentin (NEURONTIN) 300 MG capsule Take 900 mg by mouth at bedtime.  09/13/14  Yes Historical Provider, MD  guaiFENesin (MUCINEX) 600 MG 12 hr tablet Take 600 mg by mouth 2 (two) times daily.   Yes Historical Provider, MD  ipratropium (ATROVENT) 0.02 % nebulizer solution Take 2.5 mLs (0.5 mg  total) by nebulization 4 (four) times daily. 09/28/15  Yes Praveen Mannam, MD  levalbuterol (XOPENEX) 0.63 MG/3ML nebulizer solution Take 3 mLs (0.63 mg total) by nebulization every 6 (six) hours as needed for wheezing or shortness of breath. 09/28/15  Yes Praveen Mannam, MD  levothyroxine (SYNTHROID, LEVOTHROID) 175 MCG tablet Take 175 mcg by mouth daily before breakfast.   Yes Historical Provider, MD  metFORMIN (GLUMETZA) 500 MG (MOD) 24 hr tablet Take 500 mg by mouth daily with breakfast.   Yes Historical Provider, MD  metoprolol (LOPRESSOR) 50 MG tablet Take 1 tablet (50 mg total) by mouth 2 (two) times daily. 12/15/15  Yes Thayer Headings, MD  pantoprazole (PROTONIX) 40 MG tablet Take 40 mg by mouth daily.   Yes Historical Provider, MD  potassium chloride (K-DUR) 10 MEQ tablet Take 2 tablets (20 mEq total) by mouth daily. 02/08/16  Yes Shirley Friar, PA-C  promethazine (PHENERGAN) 25 MG tablet Take 25 mg by mouth every 6 (six) hours as needed for nausea or vomiting.   Yes Historical Provider, MD  rOPINIRole (REQUIP) 1 MG tablet Take 1 tablet (1 mg total) by mouth daily. 12/24/13  Yes Collene Gobble, MD  spironolactone (ALDACTONE) 25 MG tablet Take 1 tablet (25 mg total) by mouth daily. 02/16/16  Yes Shirley Friar, PA-C  torsemide (DEMADEX) 20 MG tablet Take 2 tablets (40 mg total) by mouth daily. 02/09/16  Yes Shirley Friar, PA-C  traMADol (ULTRAM) 50 MG tablet Take 50 mg by mouth daily. Reported on 10/17/2015 10/01/13  Yes Historical Provider, MD  traZODone (DESYREL) 50 MG tablet Take 50 mg by mouth at bedtime.   Yes Historical Provider, MD  valACYclovir (VALTREX) 1000 MG tablet Take 1 tablet by mouth every 12 (twelve) hours as needed (fever blisters). Reported on 10/17/2015 04/13/13  Yes Historical Provider, MD    Past Medical History: Past Medical History:  Diagnosis Date  . Aortic stenosis, mild   . Arthritis of hand    "just a little bit in both hands" (03/31/2012)  .  Asthma    "little bit" (03/31/2012)  . Atrial fibrillation [facility])    dx '04; DCCV '04, placed on flecainide, failed DCCV 04/2010, flecainide stopped; s/p successful A.fib ablation 01/31/12  . CHF (congestive heart failure) ([place])   . Diabetes mellitus without complication (HCC)    borderline  . DJD (degenerative joint disease)   . Exertional dyspnea   . Fibromyalgia   . First degree AV block    post ablation, ~440ms  . GERD (gastroesophageal reflux disease)   . Heart murmur   . Hyperlipidemia   . Hypertension   . Hypothyroidism    S/P radiation  . Left atrial enlargement    LA size 57mm by echo 11/21/11  . Mitral regurgitation    trivial  .  Obesity   . Obstructive sleep apnea    mild by sleep study 2013; pt stated he does not have a machine because "it wasn't bad enough for him to have one"  . Pacemaker 03/31/2012  . Restless leg syndrome   . Second degree AV block   . Visit for monitoring Tikosyn therapy 04/16/2016    Past Surgical History: Past Surgical History:  Procedure Laterality Date  . AORTIC VALVE REPLACEMENT N/A 08/22/2015   Procedure: AORTIC VALVE REPLACEMENT (AVR);  Surgeon: Ivin Poot, MD;  Location: [place];  Service: Open Heart Surgery;  Laterality: N/A;  . ATRIAL FIBRILLATION ABLATION  01/30/2012   PVI by Dr. Rayann Heman  . ATRIAL FIBRILLATION ABLATION N/A 01/31/2012   Procedure: ATRIAL FIBRILLATION ABLATION;  Surgeon: Thompson Grayer, MD;  Location: [facility] CATH LAB;  Service: Cardiovascular;  Laterality: N/A;  . BACK SURGERY     X 3  . CARDIAC CATHETERIZATION N/A 08/09/2015   Procedure: Right/Left Heart Cath and Coronary Angiography;  Surgeon: Peter M Martinique, MD;  Location: [place] CV LAB;  Service: Cardiovascular;  Laterality: N/A;  . CARDIAC CATHETERIZATION N/A 02/05/2016   Procedure: Right/Left Heart Cath and Coronary Angiography;  Surgeon: Jettie Booze, MD;  Location: [place] CV LAB;  Service: Cardiovascular;  Laterality: N/A;  . CARDIOVASCULAR STRESS TEST   03/12/2002   EF 48%, NO EVIDENCE OF ISCHEMIA  . CARDIOVERSION  01/2012; 03/31/2012  . CARDIOVERSION N/A 02/25/2012   Procedure: CARDIOVERSION;  Surgeon: Thompson Grayer, MD;  Location: [facility] CATH LAB;  Service: Cardiovascular;  Laterality: N/A;  . CARDIOVERSION N/A 03/31/2012   Procedure: CARDIOVERSION;  Surgeon: Thompson Grayer, MD;  Location: [facility] CATH LAB;  Service: Cardiovascular;  Laterality: N/A;  . CARDIOVERSION N/A 11/23/2015   Procedure: CARDIOVERSION;  Surgeon: Larey Dresser, MD;  Location: [facility] ENDOSCOPY;  Service: Cardiovascular;  Laterality: N/A;  . CARDIOVERSION N/A 04/08/2016   Procedure: CARDIOVERSION;  Surgeon: Jolaine Artist, MD;  Location: [facility] ENDOSCOPY;  Service: Cardiovascular;  Laterality: N/A;  . CLIPPING OF ATRIAL APPENDAGE N/A 08/22/2015   Procedure: CLIPPING OF ATRIAL APPENDAGE;  Surgeon: Ivin Poot, MD;  Location: [place];  Service: Open Heart Surgery;  Laterality: N/A;  . DOPPLER ECHOCARDIOGRAPHY  03/11/2002   EF 70-75%  . FINGER SURGERY Left    Middle finger  . FINGER TENDON REPAIR  1980's   "right little finger" (03/31/2012)  . INSERT / REPLACE / REMOVE PACEMAKER     [employer]  . LEFT AND RIGHT HEART CATHETERIZATION WITH CORONARY ANGIOGRAM N/A 10/27/2012   Procedure: LEFT AND RIGHT HEART CATHETERIZATION WITH CORONARY ANGIOGRAM;  Surgeon: Laverda Page, MD;  Location: [facility] CATH LAB;  Service: Cardiovascular;  Laterality: N/A;  . LUMBAR DISC SURGERY  1980's X2;  2000's  . MAZE N/A 08/22/2015   Procedure: MAZE;  Surgeon: Ivin Poot, MD;  Location: [place];  Service: Open Heart Surgery;  Laterality: N/A;  . PACEMAKER INSERTION  03/31/2012   STJ Accent DR pacemaker implanted by Dr Rayann Heman  . PERMANENT PACEMAKER INSERTION N/A 03/31/2012   Procedure: PERMANENT PACEMAKER INSERTION;  Surgeon: Thompson Grayer, MD;  Location: [facility] CATH LAB;  Service: Cardiovascular;  Laterality: N/A;  . TEE WITHOUT CARDIOVERSION  01/30/2012   Procedure: TRANSESOPHAGEAL ECHOCARDIOGRAM (TEE);  Surgeon:  Larey Dresser, MD;  Location: [place];  Service: Cardiovascular;  Laterality: N/A;  ablation next day  . TEE WITHOUT CARDIOVERSION N/A 08/22/2015   Procedure: TRANSESOPHAGEAL ECHOCARDIOGRAM (TEE);  Surgeon: Ivin Poot, MD;  Location:  [place] OR;  Service: Open Heart Surgery;  Laterality: N/A;  . US ECHOCARDIOGRAPHY  08/07/2009   EF 55-60%    Family History: Family History  Problem Relation Age of Onset  . Heart failure Mother   . Stroke Father     Social History: Social History   Social History  . Marital status: Married    Spouse name: N/A  . Number of children: N/A  . Years of education: N/A   Social History Main Topics  . Smoking status: Former Smoker    Packs/day: 0.50    Years: 15.00    Types: Cigarettes    Quit date: 07/18/1991  . Smokeless tobacco: Never Used  . Alcohol use Yes     Comment: occasional  . Drug use:     Types: Marijuana  . Sexual activity: Not Currently   Other Topics Concern  . None   Social History Narrative   Lives in [place] with spouse.  Works as a Air traffic controller.    Allergies:  Allergies  Allergen Reactions  . Meloxicam Rash  . 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 syndrome 09/02/15, resolved with diphenhydramine and slowing of rate    Objective:    Vital Signs:   Temp:  [97.5 F (36.4 C)-97.7 F (36.5 C)] 97.7 F (36.5 C) (12/20 0442) Pulse Rate:  [64-100] 64 (12/20 0442) Resp:  [20] 20 (12/20 0442) BP: (127-140)/(56-64) 129/59 (12/20 0442) SpO2:  [98 %-100 %] 100 % (12/20 0442) Weight:  [209 lb 9.6 oz (95.1 kg)] 209 lb 9.6 oz (95.1 kg) (12/19 1627) Last BM Date: 04/09/16  Weight change: Filed Weights   04/16/16 1627  Weight: 209 lb 9.6 oz (95.1 kg)    Intake/Output:   Intake/Output Summary (Last 24 hours) at 04/17/16 0953 Last data filed at 04/16/16 1700  Gross per 24 hour  Intake              240 ml  Output                0 ml  Net              240 ml     Physical Exam: General:  Well  appearing. Seated in chair.  HEENT: Normal Neck: supple. JVP ~6-7. Carotids 2+ bilat; no bruits. No lymphadenopathy or thyromegaly appreciated. Cor: PMI nondisplaced. RRR. No M/G/R appreciated.  Lungs: Slightly decreased, otherwise clear.  Abdomen: soft, NT, ND, no HSM. No bruits. +BS  + Umbilical hernia Extremities: no cyanosis, clubbing, rash, edema Neuro: alert & orientedx3, cranial nerves grossly intact. moves all 4 extremities w/o difficulty. Affect pleasant  Telemetry: Reviewed, SR/V paced. Occasional AV paced  Labs: Basic Metabolic Panel:  Recent Labs Lab 04/16/16 1207 04/17/16 0255  NA 137 135  K 4.5 4.1  CL 98* 98*  CO2 31 28  GLUCOSE 145* 127*  BUN 26* 28*  CREATININE 1.77* 1.82*  CALCIUM 9.6 9.1  MG 2.1 2.0    Liver Function Tests: No results for input(s): AST, ALT, ALKPHOS, BILITOT, PROT, ALBUMIN in the last 168 hours. No results for input(s): LIPASE, AMYLASE in the last 168 hours. No results for input(s): AMMONIA in the last 168 hours.  CBC: No results for input(s): WBC, NEUTROABS, HGB, HCT, MCV, PLT in the last 168 hours.  Cardiac Enzymes: No results for input(s): CKTOTAL, CKMB, CKMBINDEX, TROPONINI in the last 168 hours.  BNP: BNP (last 3 results)  Recent Labs  01/29/16 1543 02/16/16 1122 02/29/16 1111  BNP 406.6* 177.8* 181.9*    ProBNP (last 3 results)  Recent Labs  11/07/15 1155 01/15/16 1708  PROBNP 178.0* 470.0*     CBG: No results for input(s): GLUCAP in the last 168 hours.  Coagulation Studies: No results for input(s): LABPROT, INR in the last 72 hours.  Other results: EKG: AV dual paced with PVCs  Imaging:  No results found.   Medications:     Current Medications: . amLODipine  5 mg Oral Daily  . apixaban  5 mg Oral BID  . atorvastatin  40 mg Oral q1800  . cholecalciferol  2,000 Units Oral Daily  . dofetilide  375 mcg Oral BID  . gabapentin  900 mg Oral QHS  . guaiFENesin  600 mg Oral BID  . ipratropium  0.5  mg Nebulization QID  . levothyroxine  175 mcg Oral QAC breakfast  . loratadine  10 mg Oral Daily  . metFORMIN  500 mg Oral Q breakfast  . metoprolol  50 mg Oral BID  . mometasone-formoterol  2 puff Inhalation BID  . pantoprazole  40 mg Oral Daily  . potassium chloride  20 mEq Oral Daily  . rOPINIRole  1 mg Oral QPM  . sodium chloride flush  3 mL Intravenous Q12H  . spironolactone  25 mg Oral Daily  . torsemide  40 mg Oral Daily  . traMADol  50 mg Oral Daily  . traZODone  25 mg Oral QHS     Infusions:    Assessment/Plan   1. Paroxysmal Afib s/p AF ablation/Maze s/p pacemaker - s/p DCCV 04/08/16 - Tikosyn intiated 04/16/16. Plan to follow in house until Friday per EP 2. Chronic diastolic CHF - Will plan for RHC this afternoon to look at RV and look at filling pressures with concern for restrictive physiology. - Volume status stable on exam - Continue current regimen as above.  3. Pulmonary HTN - PA peak pressure 74 mm Hg on Echo 02/02/16. Plan for RHC today. 4. Mild CAD by cath 01/2016 - No CP or ACS symptoms currently, doubt source of his SOB.  - Continue medical management.   Keep NPO apart from clear breakfast. Plan for RHC this afternoon.  Length of Stay: 1  Annamaria Helling  04/17/2016, 9:53 AM  Advanced Heart Failure Team Pager [phone] (M-F; 7a - 4p)  Please contact [facility] for night-coverage after hours (4p -7a ) and weekends on [email or web address]  Patient seen and examined with Oda Kilts, PA-C. We discussed all aspects of the encounter. I agree with the assessment and plan as stated above.   Remains NYHA III despite DC-CV and well controlled volume status. Will continue current HF meds. Plan RHC to reassess volume status, pulmonary pressures and cardiac output. Procedure discussed in detal. D/w Dr. Rayann Heman as well.   Bensimhon, Daniel,MD 1:26 PM

## 2016-04-17 NOTE — H&P (View-Only) (Signed)
Advanced Heart Failure Team Consult Note  Referring Physician: Dr. Rayann Heman Primary Physician: Dr. Ardeth Perfect Primary Cardiologist:  Dr. Haroldine Laws  EP: Dr. Rayann Heman  Reason for Consultation: Afib/SOB  HPI:    [name] is a 65 y.o. 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   Last seen in HF clininc 04/04/16. Had been feeling worse and more SOB. Very fatigued, sleeping in recliner. ICD interrogation significant for on-going Aflutter/tach. AF burden > 20% x 3 months. Scheduled for DCCV as below. Also referred back to see EP.  Pt s/p DCCV 04/08/16 with conversion to Sinus Rhythm. Despite his SOB symptoms persisted, and per patient may have even gotten worse.  Seen by Dr Rayann Heman and discussed Tikosyn.  Pt agreed to and was admitted 04/16/16 for Tikosyn load.  With worsening SOB over past several months, it was also decided to repeat RHC this admission.  Eliquis to be continued without break due to recent DCCV.   Feeling OK today. Remains SOB with activity. Still having some degree of Fatigue. Pt cannot tell when his heart is out of rhythm. Weights relatively stable. Fine at rest.  Willing to undergo RHC this admission.    Review of Systems: [y] = yes, [ ]  = no   General: Weight gain [ ] ; Weight loss [ ] ; Anorexia [ ] ; Fatigue [y]; Fever [ ] ; Chills [ ] ; Weakness [ ]   Cardiac: Chest pain/pressure [ ] ; Resting SOB [ ] ; Exertional SOB [y]; Orthopnea [ ] ; Pedal Edema [ ] ; Palpitations [ ] ; Syncope [ ] ; Presyncope [ ] ; Paroxysmal nocturnal dyspnea[ ]   Pulmonary: Cough [ ] ; Wheezing[ ] ; Hemoptysis[ ] ; Sputum [ ] ; Snoring [ ]   GI: Vomiting[ ] ; Dysphagia[ ] ; Melena[ ] ; Hematochezia [ ] ; Heartburn[ ] ; Abdominal pain [ ] ; Constipation [ ] ; Diarrhea [ ] ; BRBPR [ ]   GU: Hematuria[ ] ; Dysuria [ ] ; Nocturia[ ]   Vascular: Pain in legs with walking [ ] ; Pain in feet with lying  flat [ ] ; Non-healing sores [ ] ; Stroke [ ] ; TIA [ ] ; Slurred speech [ ] ;  Neuro: Headaches[ ] ; Vertigo[ ] ; Seizures[ ] ; Paresthesias[ ] ;Blurred vision [ ] ; Diplopia [ ] ; Vision changes [ ]   Ortho/Skin: Arthritis [y]; Joint pain [y]; Muscle pain [ ] ; Joint swelling [ ] ; Back Pain [ ] ; Rash [ ]   Psych: Depression[ ] ; Anxiety[ ]   Heme: Bleeding problems [ ] ; Clotting disorders [ ] ; Anemia [ ]   Endocrine: Diabetes [ ] ; Thyroid dysfunction[ ]   Home Medications Prior to Admission medications   Medication Sig Start Date End Date Taking? Authorizing Provider  albuterol (PROVENTIL HFA;VENTOLIN HFA) 108 (90 Base) MCG/ACT inhaler Inhale 2 puffs into the lungs every 6 (six) hours as needed for wheezing or shortness of breath. 12/28/15  Yes Collene Gobble, MD  amLODipine (NORVASC) 10 MG tablet Take 0.5 tablets (5 mg total) by mouth daily. 09/18/15  Yes Thayer Headings, MD  apixaban (ELIQUIS) 5 MG TABS tablet Take 1 tablet (5 mg total) by mouth 2 (two) times daily. 04/04/16  Yes Shaune Pascal Bensimhon, MD  atorvastatin (LIPITOR) 40 MG tablet TAKE 1 TABLET(40 MG) BY MOUTH DAILY 01/31/16  Yes Thayer Headings, MD  budesonide-formoterol [facility]) 160-4.5 MCG/ACT inhaler Inhale 2 puffs into the lungs 2 (two) times daily. 11/13/15  Yes Collene Gobble, MD  Cholecalciferol (VITAMIN D) 2000 UNITS CAPS Take  2,000 Units by mouth daily.   Yes Historical Provider, MD  fexofenadine (ALLEGRA) 180 MG tablet Take 1 tablet (180 mg total) by mouth daily. 03/01/14  Yes Collene Gobble, MD  fluticasone (FLONASE) 50 MCG/ACT nasal spray Place into both nostrils 2 (two) times daily as needed for allergies or rhinitis.   Yes Historical Provider, MD  gabapentin (NEURONTIN) 300 MG capsule Take 900 mg by mouth at bedtime.  09/13/14  Yes Historical Provider, MD  guaiFENesin (MUCINEX) 600 MG 12 hr tablet Take 600 mg by mouth 2 (two) times daily.   Yes Historical Provider, MD  ipratropium (ATROVENT) 0.02 % nebulizer solution Take 2.5 mLs (0.5 mg  total) by nebulization 4 (four) times daily. 09/28/15  Yes Praveen Mannam, MD  levalbuterol (XOPENEX) 0.63 MG/3ML nebulizer solution Take 3 mLs (0.63 mg total) by nebulization every 6 (six) hours as needed for wheezing or shortness of breath. 09/28/15  Yes Praveen Mannam, MD  levothyroxine (SYNTHROID, LEVOTHROID) 175 MCG tablet Take 175 mcg by mouth daily before breakfast.   Yes Historical Provider, MD  metFORMIN (GLUMETZA) 500 MG (MOD) 24 hr tablet Take 500 mg by mouth daily with breakfast.   Yes Historical Provider, MD  metoprolol (LOPRESSOR) 50 MG tablet Take 1 tablet (50 mg total) by mouth 2 (two) times daily. 12/15/15  Yes Thayer Headings, MD  pantoprazole (PROTONIX) 40 MG tablet Take 40 mg by mouth daily.   Yes Historical Provider, MD  potassium chloride (K-DUR) 10 MEQ tablet Take 2 tablets (20 mEq total) by mouth daily. 02/08/16  Yes Shirley Friar, PA-C  promethazine (PHENERGAN) 25 MG tablet Take 25 mg by mouth every 6 (six) hours as needed for nausea or vomiting.   Yes Historical Provider, MD  rOPINIRole (REQUIP) 1 MG tablet Take 1 tablet (1 mg total) by mouth daily. 12/24/13  Yes Collene Gobble, MD  spironolactone (ALDACTONE) 25 MG tablet Take 1 tablet (25 mg total) by mouth daily. 02/16/16  Yes Shirley Friar, PA-C  torsemide (DEMADEX) 20 MG tablet Take 2 tablets (40 mg total) by mouth daily. 02/09/16  Yes Shirley Friar, PA-C  traMADol (ULTRAM) 50 MG tablet Take 50 mg by mouth daily. Reported on 10/17/2015 10/01/13  Yes Historical Provider, MD  traZODone (DESYREL) 50 MG tablet Take 50 mg by mouth at bedtime.   Yes Historical Provider, MD  valACYclovir (VALTREX) 1000 MG tablet Take 1 tablet by mouth every 12 (twelve) hours as needed (fever blisters). Reported on 10/17/2015 04/13/13  Yes Historical Provider, MD    Past Medical History: Past Medical History:  Diagnosis Date  . Aortic stenosis, mild   . Arthritis of hand    "just a little bit in both hands" (03/31/2012)  .  Asthma    "little bit" (03/31/2012)  . Atrial fibrillation [facility])    dx '04; DCCV '04, placed on flecainide, failed DCCV 04/2010, flecainide stopped; s/p successful A.fib ablation 01/31/12  . CHF (congestive heart failure) ([place])   . Diabetes mellitus without complication (HCC)    borderline  . DJD (degenerative joint disease)   . Exertional dyspnea   . Fibromyalgia   . First degree AV block    post ablation, ~470ms  . GERD (gastroesophageal reflux disease)   . Heart murmur   . Hyperlipidemia   . Hypertension   . Hypothyroidism    S/P radiation  . Left atrial enlargement    LA size 26mm by echo 11/21/11  . Mitral regurgitation    trivial  .  Obesity   . Obstructive sleep apnea    mild by sleep study 2013; pt stated he does not have a machine because "it wasn't bad enough for him to have one"  . Pacemaker 03/31/2012  . Restless leg syndrome   . Second degree AV block   . Visit for monitoring Tikosyn therapy 04/16/2016    Past Surgical History: Past Surgical History:  Procedure Laterality Date  . AORTIC VALVE REPLACEMENT N/A 08/22/2015   Procedure: AORTIC VALVE REPLACEMENT (AVR);  Surgeon: Ivin Poot, MD;  Location: [place];  Service: Open Heart Surgery;  Laterality: N/A;  . ATRIAL FIBRILLATION ABLATION  01/30/2012   PVI by Dr. Rayann Heman  . ATRIAL FIBRILLATION ABLATION N/A 01/31/2012   Procedure: ATRIAL FIBRILLATION ABLATION;  Surgeon: Thompson Grayer, MD;  Location: [facility] CATH LAB;  Service: Cardiovascular;  Laterality: N/A;  . BACK SURGERY     X 3  . CARDIAC CATHETERIZATION N/A 08/09/2015   Procedure: Right/Left Heart Cath and Coronary Angiography;  Surgeon: Peter M Martinique, MD;  Location: [place] CV LAB;  Service: Cardiovascular;  Laterality: N/A;  . CARDIAC CATHETERIZATION N/A 02/05/2016   Procedure: Right/Left Heart Cath and Coronary Angiography;  Surgeon: Jettie Booze, MD;  Location: [place] CV LAB;  Service: Cardiovascular;  Laterality: N/A;  . CARDIOVASCULAR STRESS TEST   03/12/2002   EF 48%, NO EVIDENCE OF ISCHEMIA  . CARDIOVERSION  01/2012; 03/31/2012  . CARDIOVERSION N/A 02/25/2012   Procedure: CARDIOVERSION;  Surgeon: Thompson Grayer, MD;  Location: [facility] CATH LAB;  Service: Cardiovascular;  Laterality: N/A;  . CARDIOVERSION N/A 03/31/2012   Procedure: CARDIOVERSION;  Surgeon: Thompson Grayer, MD;  Location: [facility] CATH LAB;  Service: Cardiovascular;  Laterality: N/A;  . CARDIOVERSION N/A 11/23/2015   Procedure: CARDIOVERSION;  Surgeon: Larey Dresser, MD;  Location: [facility] ENDOSCOPY;  Service: Cardiovascular;  Laterality: N/A;  . CARDIOVERSION N/A 04/08/2016   Procedure: CARDIOVERSION;  Surgeon: Jolaine Artist, MD;  Location: [facility] ENDOSCOPY;  Service: Cardiovascular;  Laterality: N/A;  . CLIPPING OF ATRIAL APPENDAGE N/A 08/22/2015   Procedure: CLIPPING OF ATRIAL APPENDAGE;  Surgeon: Ivin Poot, MD;  Location: [place];  Service: Open Heart Surgery;  Laterality: N/A;  . DOPPLER ECHOCARDIOGRAPHY  03/11/2002   EF 70-75%  . FINGER SURGERY Left    Middle finger  . FINGER TENDON REPAIR  1980's   "right little finger" (03/31/2012)  . INSERT / REPLACE / REMOVE PACEMAKER     [employer]  . LEFT AND RIGHT HEART CATHETERIZATION WITH CORONARY ANGIOGRAM N/A 10/27/2012   Procedure: LEFT AND RIGHT HEART CATHETERIZATION WITH CORONARY ANGIOGRAM;  Surgeon: Laverda Page, MD;  Location: [facility] CATH LAB;  Service: Cardiovascular;  Laterality: N/A;  . LUMBAR DISC SURGERY  1980's X2;  2000's  . MAZE N/A 08/22/2015   Procedure: MAZE;  Surgeon: Ivin Poot, MD;  Location: [place];  Service: Open Heart Surgery;  Laterality: N/A;  . PACEMAKER INSERTION  03/31/2012   STJ Accent DR pacemaker implanted by Dr Rayann Heman  . PERMANENT PACEMAKER INSERTION N/A 03/31/2012   Procedure: PERMANENT PACEMAKER INSERTION;  Surgeon: Thompson Grayer, MD;  Location: [facility] CATH LAB;  Service: Cardiovascular;  Laterality: N/A;  . TEE WITHOUT CARDIOVERSION  01/30/2012   Procedure: TRANSESOPHAGEAL ECHOCARDIOGRAM (TEE);  Surgeon:  Larey Dresser, MD;  Location: [place];  Service: Cardiovascular;  Laterality: N/A;  ablation next day  . TEE WITHOUT CARDIOVERSION N/A 08/22/2015   Procedure: TRANSESOPHAGEAL ECHOCARDIOGRAM (TEE);  Surgeon: Ivin Poot, MD;  Location:  [place] OR;  Service: Open Heart Surgery;  Laterality: N/A;  . US ECHOCARDIOGRAPHY  08/07/2009   EF 55-60%    Family History: Family History  Problem Relation Age of Onset  . Heart failure Mother   . Stroke Father     Social History: Social History   Social History  . Marital status: Married    Spouse name: N/A  . Number of children: N/A  . Years of education: N/A   Social History Main Topics  . Smoking status: Former Smoker    Packs/day: 0.50    Years: 15.00    Types: Cigarettes    Quit date: 07/18/1991  . Smokeless tobacco: Never Used  . Alcohol use Yes     Comment: occasional  . Drug use:     Types: Marijuana  . Sexual activity: Not Currently   Other Topics Concern  . None   Social History Narrative   Lives in [place] with spouse.  Works as a Air traffic controller.    Allergies:  Allergies  Allergen Reactions  . Meloxicam Rash  . 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 syndrome 09/02/15, resolved with diphenhydramine and slowing of rate    Objective:    Vital Signs:   Temp:  [97.5 F (36.4 C)-97.7 F (36.5 C)] 97.7 F (36.5 C) (12/20 0442) Pulse Rate:  [64-100] 64 (12/20 0442) Resp:  [20] 20 (12/20 0442) BP: (127-140)/(56-64) 129/59 (12/20 0442) SpO2:  [98 %-100 %] 100 % (12/20 0442) Weight:  [209 lb 9.6 oz (95.1 kg)] 209 lb 9.6 oz (95.1 kg) (12/19 1627) Last BM Date: 04/09/16  Weight change: Filed Weights   04/16/16 1627  Weight: 209 lb 9.6 oz (95.1 kg)    Intake/Output:   Intake/Output Summary (Last 24 hours) at 04/17/16 0953 Last data filed at 04/16/16 1700  Gross per 24 hour  Intake              240 ml  Output                0 ml  Net              240 ml     Physical Exam: General:  Well  appearing. Seated in chair.  HEENT: Normal Neck: supple. JVP ~6-7. Carotids 2+ bilat; no bruits. No lymphadenopathy or thyromegaly appreciated. Cor: PMI nondisplaced. RRR. No M/G/R appreciated.  Lungs: Slightly decreased, otherwise clear.  Abdomen: soft, NT, ND, no HSM. No bruits. +BS  + Umbilical hernia Extremities: no cyanosis, clubbing, rash, edema Neuro: alert & orientedx3, cranial nerves grossly intact. moves all 4 extremities w/o difficulty. Affect pleasant  Telemetry: Reviewed, SR/V paced. Occasional AV paced  Labs: Basic Metabolic Panel:  Recent Labs Lab 04/16/16 1207 04/17/16 0255  NA 137 135  K 4.5 4.1  CL 98* 98*  CO2 31 28  GLUCOSE 145* 127*  BUN 26* 28*  CREATININE 1.77* 1.82*  CALCIUM 9.6 9.1  MG 2.1 2.0    Liver Function Tests: No results for input(s): AST, ALT, ALKPHOS, BILITOT, PROT, ALBUMIN in the last 168 hours. No results for input(s): LIPASE, AMYLASE in the last 168 hours. No results for input(s): AMMONIA in the last 168 hours.  CBC: No results for input(s): WBC, NEUTROABS, HGB, HCT, MCV, PLT in the last 168 hours.  Cardiac Enzymes: No results for input(s): CKTOTAL, CKMB, CKMBINDEX, TROPONINI in the last 168 hours.  BNP: BNP (last 3 results)  Recent Labs  01/29/16 1543 02/16/16 1122 02/29/16 1111  BNP 406.6* 177.8* 181.9*    ProBNP (last 3 results)  Recent Labs  11/07/15 1155 01/15/16 1708  PROBNP 178.0* 470.0*     CBG: No results for input(s): GLUCAP in the last 168 hours.  Coagulation Studies: No results for input(s): LABPROT, INR in the last 72 hours.  Other results: EKG: AV dual paced with PVCs  Imaging:  No results found.   Medications:     Current Medications: . amLODipine  5 mg Oral Daily  . apixaban  5 mg Oral BID  . atorvastatin  40 mg Oral q1800  . cholecalciferol  2,000 Units Oral Daily  . dofetilide  375 mcg Oral BID  . gabapentin  900 mg Oral QHS  . guaiFENesin  600 mg Oral BID  . ipratropium  0.5  mg Nebulization QID  . levothyroxine  175 mcg Oral QAC breakfast  . loratadine  10 mg Oral Daily  . metFORMIN  500 mg Oral Q breakfast  . metoprolol  50 mg Oral BID  . mometasone-formoterol  2 puff Inhalation BID  . pantoprazole  40 mg Oral Daily  . potassium chloride  20 mEq Oral Daily  . rOPINIRole  1 mg Oral QPM  . sodium chloride flush  3 mL Intravenous Q12H  . spironolactone  25 mg Oral Daily  . torsemide  40 mg Oral Daily  . traMADol  50 mg Oral Daily  . traZODone  25 mg Oral QHS     Infusions:    Assessment/Plan   1. Paroxysmal Afib s/p AF ablation/Maze s/p pacemaker - s/p DCCV 04/08/16 - Tikosyn intiated 04/16/16. Plan to follow in house until Friday per EP 2. Chronic diastolic CHF - Will plan for RHC this afternoon to look at RV and look at filling pressures with concern for restrictive physiology. - Volume status stable on exam - Continue current regimen as above.  3. Pulmonary HTN - PA peak pressure 74 mm Hg on Echo 02/02/16. Plan for RHC today. 4. Mild CAD by cath 01/2016 - No CP or ACS symptoms currently, doubt source of his SOB.  - Continue medical management.   Keep NPO apart from clear breakfast. Plan for RHC this afternoon.  Length of Stay: 1  Annamaria Helling  04/17/2016, 9:53 AM  Advanced Heart Failure Team Pager [phone] (M-F; 7a - 4p)  Please contact [facility] for night-coverage after hours (4p -7a ) and weekends on [email or web address]  Patient seen and examined with Oda Kilts, PA-C. We discussed all aspects of the encounter. I agree with the assessment and plan as stated above.   Remains NYHA III despite DC-CV and well controlled volume status. Will continue current HF meds. Plan RHC to reassess volume status, pulmonary pressures and cardiac output. Procedure discussed in detal. D/w Dr. Rayann Heman as well.   Bensimhon, Daniel,MD 1:26 PM

## 2016-04-18 ENCOUNTER — Encounter (HOSPITAL_COMMUNITY): Payer: Self-pay | Admitting: Internal Medicine

## 2016-04-18 DIAGNOSIS — I480 Paroxysmal atrial fibrillation: Principal | ICD-10-CM

## 2016-04-18 LAB — BASIC METABOLIC PANEL
Anion gap: 8 (ref 5–15)
BUN: 26 mg/dL — ABNORMAL HIGH (ref 6–20)
CO2: 30 mmol/L (ref 22–32)
Calcium: 9.5 mg/dL (ref 8.9–10.3)
Chloride: 100 mmol/L — ABNORMAL LOW (ref 101–111)
Creatinine, Ser: 1.63 mg/dL — ABNORMAL HIGH (ref 0.61–1.24)
GFR calc Af Amer: 49 mL/min — ABNORMAL LOW (ref >60)
GFR calc non Af Amer: 42 mL/min — ABNORMAL LOW (ref >60)
Glucose, Bld: 85 mg/dL (ref 65–99)
Potassium: 5 mmol/L (ref 3.5–5.1)
Sodium: 138 mmol/L (ref 135–145)

## 2016-04-18 LAB — MAGNESIUM: Magnesium: 2.2 mg/dL (ref 1.7–2.4)

## 2016-04-18 NOTE — Progress Notes (Signed)
Advanced Heart Failure Rounding Note   Subjective:    Feels ok this am.   RHC yesterday as below. Now on Tikosyn 264mcg bid. Tolerating well. No dysrhythmias on tele.   RA = 13 RV = 66/3/15 PA = 69/23 (42) PCW = 21 (v=38) Fick cardiac output/index = 6.3/2.9 PVR = 3.8 WU Ao sat = 87% (radial artery ABG) PA sat = 43%, 43% SVC sat = 50%  PFTs 4/17 FEV1 2.2 (49%) FVC 2.70 DLCO 54%  Objective:   Weight Range:  Vital Signs:   Temp:  [97.1 F (36.2 C)-98.1 F (36.7 C)] 97.1 F (36.2 C) (12/21 0541) Pulse Rate:  [0-147] 63 (12/21 0847) Resp:  [0-83] 16 (12/21 0847) BP: (105-139)/(51-75) 113/51 (12/21 0847) SpO2:  [0 %-100 %] 100 % (12/21 0847) Last BM Date: 04/17/16  Weight change: Filed Weights   04/16/16 1627  Weight: 95.1 kg (209 lb 9.6 oz)    Intake/Output:   Intake/Output Summary (Last 24 hours) at 04/18/16 0917 Last data filed at 04/17/16 1308  Gross per 24 hour  Intake              243 ml  Output                0 ml  Net              243 ml     Physical Exam: General:  Well appearing. Seated in chair.  HEENT: Normal Neck: supple. JVP ~8-9. Carotids 2+ bilat; no bruits. No lymphadenopathy or thyromegaly appreciated. Cor: PMI nondisplaced. RRR. No M/G/R appreciated.  Lungs: Slightly decreased, otherwise clear.  Abdomen: soft, NT, ND, no HSM. No bruits. +BS  + Umbilical hernia Extremities: no cyanosis, clubbing, rash, edema Neuro: alert & orientedx3, cranial nerves grossly intact. moves all 4 extremities w/o difficulty. Affect pleasant   Telemetry: AV paced 60-70s  Labs: Basic Metabolic Panel:  Recent Labs Lab 04/16/16 1207 04/17/16 0255 04/18/16 0244  NA 137 135 138  K 4.5 4.1 5.0  CL 98* 98* 100*  CO2 31 28 30   GLUCOSE 145* 127* 85  BUN 26* 28* 26*  CREATININE 1.77* 1.82* 1.63*  CALCIUM 9.6 9.1 9.5  MG 2.1 2.0 2.2    Liver Function Tests: No results for input(s): AST, ALT, ALKPHOS, BILITOT, PROT, ALBUMIN in the last 168  hours. No results for input(s): LIPASE, AMYLASE in the last 168 hours. No results for input(s): AMMONIA in the last 168 hours.  CBC:  Recent Labs Lab 04/17/16 1032  WBC 7.1  HGB 9.0*  HCT 30.4*  MCV 80.0  PLT 190    Cardiac Enzymes: No results for input(s): CKTOTAL, CKMB, CKMBINDEX, TROPONINI in the last 168 hours.  BNP: BNP (last 3 results)  Recent Labs  01/29/16 1543 02/16/16 1122 02/29/16 1111  BNP 406.6* 177.8* 181.9*    ProBNP (last 3 results)  Recent Labs  11/07/15 1155 01/15/16 1708  PROBNP 178.0* 470.0*      Other results:  Imaging:  No results found.   Medications:     Scheduled Medications: . amLODipine  5 mg Oral Daily  . apixaban  5 mg Oral BID  . atorvastatin  40 mg Oral q1800  . cholecalciferol  2,000 Units Oral Daily  . dofetilide  250 mcg Oral BID  . gabapentin  900 mg Oral QHS  . guaiFENesin  600 mg Oral BID  . levothyroxine  175 mcg Oral QAC breakfast  . loratadine  10 mg Oral Daily  .  metFORMIN  500 mg Oral Q breakfast  . metoprolol  50 mg Oral BID  . mometasone-formoterol  2 puff Inhalation BID  . pantoprazole  40 mg Oral Daily  . potassium chloride  20 mEq Oral Daily  . rOPINIRole  1 mg Oral QPM  . sodium chloride flush  3 mL Intravenous Q12H  . sodium chloride flush  3 mL Intravenous Q12H  . sodium chloride flush  3 mL Intravenous Q12H  . spironolactone  25 mg Oral Daily  . torsemide  40 mg Oral Daily  . traMADol  50 mg Oral Daily  . traZODone  25 mg Oral QHS     Infusions:   PRN Medications:  sodium chloride, sodium chloride, sodium chloride, acetaminophen, albuterol, fluticasone, ondansetron (ZOFRAN) IV, promethazine, sodium chloride flush, sodium chloride flush, sodium chloride flush   Assessment:    1. Paroxysmal Afib s/p AF ablation/Maze s/p pacemaker - s/p DCCV 04/08/16 -> now in NSR - Tikosyn intiated 04/16/16. Plan to follow in house until Friday per EP 2. Chronic diastolic CHF - RHC yesterday  is mixed picture with mildly elevated filling pressures and PH related to COPD. Was hypoxic at rest 3. Pulmonary HTN  - see above. Mixed pciture 4. Mild CAD by cath 01/2016 - No CP or ACS symptoms currently, doubt source of his SOB.  - Continue medical management.  5. Chronic respiratory failure on home O2 -sats in 80s yesterday at rest. Likely driving [place]. Needs to be compliant with using O2.  6. Mitral regurgitation -prominent v waves on PCWP tracing at cath. Echo reviewed today. MR mild to moderate at worst.   Continue tikosyn load and current diuretic regimen. Will need CR or PR at discharge.     Length of Stay: 2   Glori Bickers MD 04/18/2016, 9:17 AM  Advanced Heart Failure Team Pager [phone] (M-F; [place])  Please contact [facility] for night-coverage after hours (4p -7a ) and weekends on [email or web address]

## 2016-04-18 NOTE — Discharge Instructions (Signed)
You have an appointment set up with the Atrial Fibrillation Clinic.  Multiple studies have shown that being followed by a dedicated atrial fibrillation clinic in addition to the standard care you receive from your other physicians improves health. We believe that enrollment in the atrial fibrillation clinic will allow us to better care for you.   The phone number to the Atrial Fibrillation Clinic is [phone]. The clinic is staffed Monday through Friday from 8:30am to 5pm.  Parking Directions: The clinic is located in the Heart and Vascular Building connected to [facility]. 1)From [address] turn on to [address] and go to the 3rd entrance  (Heart and Vascular entrance) on the right. 2)Look to the right for Heart &Vascular Parking Garage. 3)A code for the entrance is required please call the clinic to receive this.   4)Take the elevators to the 1st floor. Registration is in the room with the glass walls at the end of the hallway.  If you have any trouble parking or locating the clinic, please don't hesitate to call [phone].  

## 2016-04-18 NOTE — Care Management Note (Signed)
Case Management Note Marvetta Gibbons RN, BSN Unit 2W-Case Manager [phone]  Patient Details  Name: JEAN-PAUL KOSH MRN: [id number] Date of Birth: December 10, 1950  Subjective/Objective:   Pt admitted with afib- for Tikosyn load                 Action/Plan: PTA pt lived at home with wife- anticipate return home- referral for Tikosyn needs- per insurance check- generic covered at $95/mo copay- spoke with pt at bedside along with wife- per conversation wife states that she has spoken with insurance and they qualify to receive drug for free (she states she is working on paperwork)- pt uses Counselling psychologist in [place] can order drug in stock once they have script in hand. Pt will need 7 day supply on discharge from [facility]- will need script with no refills to send down to have filled prior to discharge.   Expected Discharge Date:     04/19/16             Expected Discharge Plan:  Home/Self Care  In-House Referral:     Discharge planning Services  CM Consult, Medication Assistance  Post Acute Care Choice:  NA Choice offered to:  NA  DME Arranged:    DME Agency:     HH Arranged:    HH Agency:     Status of Service:  Completed, signed off  If discussed at [employer] of Stay Meetings, dates discussed:    Additional Comments:  Dawayne Patricia, RN 04/18/2016, 11:45 AM

## 2016-04-18 NOTE — Progress Notes (Signed)
CARDIAC REHAB PHASE I   PRE:  Rate/Rhythm: 60 paced  BP:  Sitting: 121/46        SaO2: 100 2L  MODE:  Ambulation: 500 ft   POST:  Rate/Rhythm: 67 paced  BP:  Sitting: 130/68         SaO2: 95 on 2L  Pt ambulated 500 ft on 2L O2, hand held assist, steady gait, tolerated well with no complaints. Pt states he is interested in cardiac or pulmonary rehab, with oxygen needs and unsuccessful attempts to complete cardiac rehab program in the past, pt may be more appropriate for pulmonary rehab referral, will forward information to pulmonary rehab staff. Pt agreeable. Pt to recliner after walk, call bell within reach. Will follow up tomorrow.    [place], RN, BSN 04/18/2016 2:02 PM

## 2016-04-18 NOTE — Progress Notes (Signed)
    SUBJECTIVE: The patient is doing well today.  At this time, he denies chest pain, shortness of breath, or any new concerns.  [name] amLODipine  5 mg Oral Daily  . apixaban  5 mg Oral BID  . atorvastatin  40 mg Oral q1800  . cholecalciferol  2,000 Units Oral Daily  . dofetilide  250 mcg Oral BID  . gabapentin  900 mg Oral QHS  . guaiFENesin  600 mg Oral BID  . levothyroxine  175 mcg Oral QAC breakfast  . loratadine  10 mg Oral Daily  . metFORMIN  500 mg Oral Q breakfast  . metoprolol  50 mg Oral BID  . mometasone-formoterol  2 puff Inhalation BID  . pantoprazole  40 mg Oral Daily  . potassium chloride  20 mEq Oral Daily  . rOPINIRole  1 mg Oral QPM  . sodium chloride flush  3 mL Intravenous Q12H  . sodium chloride flush  3 mL Intravenous Q12H  . sodium chloride flush  3 mL Intravenous Q12H  . spironolactone  25 mg Oral Daily  . torsemide  40 mg Oral Daily  . traMADol  50 mg Oral Daily  . traZODone  25 mg Oral QHS     OBJECTIVE: Physical Exam: Vitals:   04/17/16 1703 04/17/16 1724 04/17/16 2112 04/18/16 0541  BP: 138/75 122/63  (!) 105/55  Pulse: 66 68  63  Resp: 16 14  20   Temp:  98.1 F (36.7 C)  97.1 F (36.2 C)  TempSrc:  Oral  Oral  SpO2: 92% 100% 99% 100%  Weight:      Height:        Intake/Output Summary (Last 24 hours) at 04/18/16 [id number] Last data filed at 04/17/16 1308  Gross per 24 hour  Intake              243 ml  Output                0 ml  Net              243 ml    Telemetry reveals SR/V paced, occ AV paced  GEN- The patient is well appearing, alert and oriented x 3 today.   Head- normocephalic, atraumatic Eyes-  Sclera clear, conjunctiva pink Ears- hearing intact Oropharynx- clear Neck- supple,  Lungs- Clear to ausculation bilaterally, normal work of breathing Heart- Regular rate and rhythm, no significant murmurs, no rubs or gallops GI- soft, NT, ND Extremities- no clubbing, cyanosis, or edema Skin- no rash or lesion Psych- euthymic mood,  full affect Neuro- no gross deficits appreciated  LABS: Basic Metabolic Panel:  Recent Labs  04/17/16 0255 04/18/16 0244  NA 135 138  K 4.1 5.0  CL 98* 100*  CO2 28 30  GLUCOSE 127* 85  BUN 28* 26*  CREATININE 1.82* 1.63*  CALCIUM 9.1 9.5  MG 2.0 2.2    ASSESSMENT AND PLAN:   1. PAFib, Tikosyn initiation     CHA2Ds2vasc is 2, on Eliquis     QTc stable  2. Heart block w/PPM     SJM, implanted 2013  3. HTN     Stable  4. Chronic CHF (diastolic)     Appreciate Dr Bensimhon's input  5. COPD     At his baseline     Continue home regime  6. Hx AVR (biopprosthetic)  Anticipate discharge to home tomorrow  Thompson Grayer, MD 04/18/2016 8:29 AM

## 2016-04-19 ENCOUNTER — Inpatient Hospital Stay (HOSPITAL_COMMUNITY): Payer: Medicare Other

## 2016-04-19 DIAGNOSIS — I484 Atypical atrial flutter: Secondary | ICD-10-CM

## 2016-04-19 DIAGNOSIS — I4819 Other persistent atrial fibrillation: Secondary | ICD-10-CM

## 2016-04-19 LAB — BASIC METABOLIC PANEL
Anion gap: 9 (ref 5–15)
BUN: 30 mg/dL — ABNORMAL HIGH (ref 6–20)
CO2: 27 mmol/L (ref 22–32)
Calcium: 9.1 mg/dL (ref 8.9–10.3)
Chloride: 99 mmol/L — ABNORMAL LOW (ref 101–111)
Creatinine, Ser: 1.86 mg/dL — ABNORMAL HIGH (ref 0.61–1.24)
GFR calc Af Amer: 42 mL/min — ABNORMAL LOW (ref >60)
GFR calc non Af Amer: 36 mL/min — ABNORMAL LOW (ref >60)
Glucose, Bld: 127 mg/dL — ABNORMAL HIGH (ref 65–99)
Potassium: 4.3 mmol/L (ref 3.5–5.1)
Sodium: 135 mmol/L (ref 135–145)

## 2016-04-19 LAB — MAGNESIUM: Magnesium: 2.1 mg/dL (ref 1.7–2.4)

## 2016-04-19 MED ORDER — DOFETILIDE 250 MCG PO CAPS: 250.0000 ug | ORAL_CAPSULE | Freq: Two times a day (BID) | ORAL | 3 refills | Status: DC

## 2016-04-19 NOTE — Progress Notes (Signed)
Doing well Qt stable CXR reviewed  Anticipate discharge today if ekg post am dose reveals stable QT  Thompson Grayer MD, [facility] 04/19/2016 10:28 AM

## 2016-04-19 NOTE — Progress Notes (Signed)
Patient in a stable condition , discharge education completed with pt and wife at bedside they verbalised understanding, 7 days supply of tikosyn given to them from the pharmacy, pt belongings at bedside, iv removed, tele dcccmd notified, patient taken off the unit by a hospital volunteer on a wheelchair

## 2016-04-19 NOTE — Discharge Summary (Signed)
ELECTROPHYSIOLOGY PROCEDURE DISCHARGE SUMMARY    Patient ID: [name],  MRN: [id number], DOB/AGE: 1950/11/27 65 y.o.  Admit date: 04/16/2016 Discharge date: 04/19/2016  Primary Care Physician: Velna Hatchet, MD  Primary Cardiologist: Dr. Haroldine Laws Electrophysiologist: Dr. Rayann Heman  Primary Discharge Diagnosis:  1.  Persistent atrial fibrillation status post Tikosyn loading this admission 2. Atypical atrial flutter     CHA2DS2Vasc is at least 2, on Eliquis  Secondary Discharge Diagnosis:  1. Chronic lung disease 2. P.HTN 3. Chronic CHF (diastolic) 4. DM 5. HTN 6. VHD w/hx of AVR   Allergies  Allergen Reactions  . Meloxicam Rash  . 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 syndrome 09/02/15, resolved with diphenhydramine and slowing of rate     Procedures This Admission:  1.  Tikosyn loading 2.   04/17/16: RHC, Dr. Haroldine Laws Conclusion: Findings: RA = 13 RV = 66/3/15 PA = 69/23 (42) PCW = 21 (v=38) Fick cardiac output/index = 6.3/2.9 PVR = 3.8 WU Ao sat = 87% (radial artery ABG) PA sat = 43%, 43% SVC sat = 50% Assessment: 1. 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 Mildly elevated left sided pressures with prominent v-waves 3. PA sat is low but calculated Fick CO is within normal range. 4. Resting hypoxemia  Plan/Discussion: Suspect dyspnea multifactorial due to HF, lung disease and pulmonary HTN.     Brief HPI: [name] is a 65 y.o. male with a past medical history as noted above.  They were referred to EP in the outpatient setting for treatment options of atrial fibrillation/flutter.  Risks, benefits, and alternatives to Tikosyn were reviewed with the patient who wished to proceed.    Hospital Course:  The patient was admitted and Tikosyn was initiated.  Renal function and electrolytes were followed during the hospitalization.  Their QTc remained stable.  On 04/17/16 they underwent RHC details  noted above.  The patient was evaluated by cardiac rehab and felt best served with pulmonary rehab and referred to their service, to follow up out patient. He was monitored until discharge on telemetry which demonstrated SR/V paced or AV paced rhythm throughout his stay, no arrhythmias.  CXR showed improvement in his pulmonary edema with mild vascular congestion, no effusion or infiltrate.   On the day of discharge, he was examined by Dr. Haroldine Laws who encouraged the patient to be more compliant with his home O2, and plans to follow up in 2-3 weeks.   Dr Rayann Heman who considered him stable for discharge to home.  Follow-up has been arranged with the AFib clinic 1 week and with Dr Rayann Heman in 4 weeks.   Physical Exam: Vitals:   04/18/16 1458 04/18/16 2022 04/19/16 0513 04/19/16 0858  BP: (!) 118/47 (!) 129/55 119/60 (!) 121/48  Pulse: 65 77 60 65  Resp: 18 18 18 18   Temp: 97.6 F (36.4 C) 98.5 F (36.9 C) 97.6 F (36.4 C) 97.8 F (36.6 C)  TempSrc: Oral Oral Oral Oral  SpO2: 100% 99% 99% 100%  Weight:      Height:        GEN- The patient is well appearing, alert and oriented x 3 today.   HEENT: normocephalic, atraumatic; sclera clear, conjunctiva pink; hearing intact; oropharynx clear; neck supple, no JVP Lymph- no cervical lymphadenopathy Lungs- Clear to ausculation bilaterally, normal work of breathing.  No wheezes, rales, rhonchi Heart- Regular rate and rhythm, no murmurs, rubs or gallops, PMI not laterally displaced GI-  soft, non-tender, non-distended Extremities- no clubbing, cyanosis, or edema MS- no significant deformity or atrophy Skin- warm and dry, no rash or lesion Psych- euthymic mood, full affect Neuro- strength and sensation are intact   Labs:   Lab Results  Component Value Date   WBC 7.1 04/17/2016   HGB 9.0 (L) 04/17/2016   HCT 30.4 (L) 04/17/2016   MCV 80.0 04/17/2016   PLT 190 04/17/2016     Recent Labs Lab 04/19/16 0325  NA 135  K 4.3  CL 99*  CO2 27    BUN 30*  CREATININE 1.86*  CALCIUM 9.1  GLUCOSE 127*     Discharge Medications:  Allergies as of 04/19/2016      Reactions   Meloxicam Rash   Vancomycin Other (See Comments)   Red Man's syndrome 09/02/15, resolved with diphenhydramine and slowing of rate      Medication List    TAKE these medications   albuterol 108 (90 Base) MCG/ACT inhaler Commonly known as:  PROVENTIL HFA;VENTOLIN HFA Inhale 2 puffs into the lungs every 6 (six) hours as needed for wheezing or shortness of breath.   amLODipine 10 MG tablet Commonly known as:  NORVASC Take 0.5 tablets (5 mg total) by mouth daily.   apixaban 5 MG Tabs tablet Commonly known as:  ELIQUIS Take 1 tablet (5 mg total) by mouth 2 (two) times daily.   atorvastatin 40 MG tablet Commonly known as:  LIPITOR TAKE 1 TABLET(40 MG) BY MOUTH DAILY   budesonide-formoterol 160-4.5 MCG/ACT inhaler Commonly known as:  SYMBICORT Inhale 2 puffs into the lungs 2 (two) times daily.   dofetilide 250 MCG capsule Commonly known as:  TIKOSYN Take 1 capsule (250 mcg total) by mouth 2 (two) times daily.   fexofenadine 180 MG tablet Commonly known as:  ALLEGRA Take 1 tablet (180 mg total) by mouth daily.   fluticasone 50 MCG/ACT nasal spray Commonly known as:  FLONASE Place into both nostrils 2 (two) times daily as needed for allergies or rhinitis.   gabapentin 300 MG capsule Commonly known as:  NEURONTIN Take 900 mg by mouth at bedtime.   guaiFENesin 600 MG 12 hr tablet Commonly known as:  MUCINEX Take 600 mg by mouth 2 (two) times daily.   ipratropium 0.02 % nebulizer solution Commonly known as:  ATROVENT Take 2.5 mLs (0.5 mg total) by nebulization 4 (four) times daily.   levalbuterol 0.63 MG/3ML nebulizer solution Commonly known as:  XOPENEX Take 3 mLs (0.63 mg total) by nebulization every 6 (six) hours as needed for wheezing or shortness of breath.   levothyroxine 175 MCG tablet Commonly known as:  SYNTHROID, LEVOTHROID Take  175 mcg by mouth daily before breakfast.   metFORMIN 500 MG (MOD) 24 hr tablet Commonly known as:  GLUMETZA Take 500 mg by mouth daily with breakfast.   metoprolol 50 MG tablet Commonly known as:  LOPRESSOR Take 1 tablet (50 mg total) by mouth 2 (two) times daily.   pantoprazole 40 MG tablet Commonly known as:  PROTONIX Take 40 mg by mouth daily.   potassium chloride 10 MEQ tablet Commonly known as:  K-DUR Take 2 tablets (20 mEq total) by mouth daily.   promethazine 25 MG tablet Commonly known as:  PHENERGAN Take 25 mg by mouth every 6 (six) hours as needed for nausea or vomiting.   rOPINIRole 1 MG tablet Commonly known as:  REQUIP Take 1 tablet (1 mg total) by mouth daily.   spironolactone 25 MG tablet Commonly known as:  ALDACTONE Take 1 tablet (25 mg total) by mouth daily.   torsemide 20 MG tablet Commonly known as:  DEMADEX Take 2 tablets (40 mg total) by mouth daily.   traMADol 50 MG tablet Commonly known as:  ULTRAM Take 50 mg by mouth daily. Reported on 10/17/2015   traZODone 50 MG tablet Commonly known as:  DESYREL Take 50 mg by mouth at bedtime.   valACYclovir 1000 MG tablet Commonly known as:  VALTREX Take 1 tablet by mouth every 12 (twelve) hours as needed (fever blisters). Reported on 10/17/2015   Vitamin D 2000 units Caps Take 2,000 Units by mouth daily.       Disposition:  Home Discharge Instructions    Diet - low sodium heart healthy    Complete by:  As directed    Increase activity slowly    Complete by:  As directed      Follow-up Information    [facility] Follow up on 04/25/2016.   Specialty:  Cardiology Why:  1:30PM Contact information: [address] [id number] [place] [phone]       Thompson Grayer, MD Follow up on 05/20/2016.   Specialty:  Cardiology Why:  9:15AM Contact information: [address] [place] [address] [phone]         Glori Bickers, MD Follow up.   Specialty:  Cardiology Why:  please call to make a follow up appoint for the next 2-3 weeks  Contact information: [place] [address] [phone]           Duration of Discharge Encounter: Greater than 30 minutes including physician time.  Venetia Night, PA-C 04/19/2016 12:36 PM   Trude Mcburney

## 2016-04-19 NOTE — Progress Notes (Addendum)
Advanced Heart Failure Rounding Note   Subjective:    Eating breakfast. Feels good. Maintaining NSR on Tikosyn. Tolerating well. No dysrhythmias on tele.   RA = 13 RV = 66/3/15 PA = 69/23 (42) PCW = 21 (v=38) Fick cardiac output/index = 6.3/2.9 PVR = 3.8 WU Ao sat = 87% (radial artery ABG) PA sat = 43%, 43% SVC sat = 50%  PFTs 4/17 FEV1 2.2 (49%) FVC 2.70 DLCO 54%  Objective:   Weight Range:  Vital Signs:   Temp:  [97.6 F (36.4 C)-98.5 F (36.9 C)] 97.6 F (36.4 C) (12/22 0513) Pulse Rate:  [60-77] 60 (12/22 0513) Resp:  [16-18] 18 (12/22 0513) BP: (113-129)/(47-60) 119/60 (12/22 0513) SpO2:  [99 %-100 %] 99 % (12/22 0513) Last BM Date: 04/17/16  Weight change: Filed Weights   04/16/16 1627  Weight: 95.1 kg (209 lb 9.6 oz)    Intake/Output:   Intake/Output Summary (Last 24 hours) at 04/19/16 0835 Last data filed at 04/18/16 1700  Gross per 24 hour  Intake              480 ml  Output                0 ml  Net              480 ml     Physical Exam: General:  Well appearing. Seated in chair eating breakfast.  HEENT: Normal Neck: supple. JVP 6-7. Carotids 2+ bilat; no bruits. No lymphadenopathy or thyromegaly appreciated. Cor: PMI nondisplaced. RRR. No M/G/R appreciated.  Lungs: Slightly decreased, otherwise clear.  Abdomen: soft, NT, ND, no HSM. No bruits. +BS  + Umbilical hernia Extremities: no cyanosis, clubbing, rash, edema Neuro: alert & orientedx3, cranial nerves grossly intact. moves all 4 extremities w/o difficulty. Affect pleasant   Telemetry: AV paced 60-70s  Labs: Basic Metabolic Panel:  Recent Labs Lab 04/16/16 1207 04/17/16 0255 04/18/16 0244 04/19/16 0325  NA 137 135 138 135  K 4.5 4.1 5.0 4.3  CL 98* 98* 100* 99*  CO2 31 28 30 27   GLUCOSE 145* 127* 85 127*  BUN 26* 28* 26* 30*  CREATININE 1.77* 1.82* 1.63* 1.86*  CALCIUM 9.6 9.1 9.5 9.1  MG 2.1 2.0 2.2 2.1    Liver Function Tests: No results for input(s): AST,  ALT, ALKPHOS, BILITOT, PROT, ALBUMIN in the last 168 hours. No results for input(s): LIPASE, AMYLASE in the last 168 hours. No results for input(s): AMMONIA in the last 168 hours.  CBC:  Recent Labs Lab 04/17/16 1032  WBC 7.1  HGB 9.0*  HCT 30.4*  MCV 80.0  PLT 190    Cardiac Enzymes: No results for input(s): CKTOTAL, CKMB, CKMBINDEX, TROPONINI in the last 168 hours.  BNP: BNP (last 3 results)  Recent Labs  01/29/16 1543 02/16/16 1122 02/29/16 1111  BNP 406.6* 177.8* 181.9*    ProBNP (last 3 results)  Recent Labs  11/07/15 1155 01/15/16 1708  PROBNP 178.0* 470.0*      Other results:  Imaging: No results found.   Medications:     Scheduled Medications: . amLODipine  5 mg Oral Daily  . apixaban  5 mg Oral BID  . atorvastatin  40 mg Oral q1800  . cholecalciferol  2,000 Units Oral Daily  . dofetilide  250 mcg Oral BID  . gabapentin  900 mg Oral QHS  . guaiFENesin  600 mg Oral BID  . levothyroxine  175 mcg Oral QAC breakfast  . loratadine  10 mg Oral Daily  . metFORMIN  500 mg Oral Q breakfast  . metoprolol  50 mg Oral BID  . mometasone-formoterol  2 puff Inhalation BID  . pantoprazole  40 mg Oral Daily  . potassium chloride SA  20 mEq Oral Daily  . rOPINIRole  1 mg Oral QPM  . sodium chloride flush  3 mL Intravenous Q12H  . sodium chloride flush  3 mL Intravenous Q12H  . sodium chloride flush  3 mL Intravenous Q12H  . spironolactone  25 mg Oral Daily  . torsemide  40 mg Oral Daily  . traMADol  50 mg Oral Daily  . traZODone  25 mg Oral QHS    Infusions:   PRN Medications: sodium chloride, sodium chloride, sodium chloride, acetaminophen, albuterol, fluticasone, ondansetron (ZOFRAN) IV, promethazine, sodium chloride flush, sodium chloride flush, sodium chloride flush   Assessment:    1. Paroxysmal Afib s/p AF ablation/Maze s/p pacemaker - s/p DCCV 04/08/16 -> now in NSR - Tikosyn intiated 04/16/16. Load complett this am 2. Chronic  diastolic CHF - RHC  is mixed picture with mildly elevated filling pressures and PH related to COPD. Was hypoxic at rest 3. Pulmonary HTN  - see above. Mixed picture 4. Mild CAD by cath 01/2016 - No CP or ACS symptoms currently, doubt source of his SOB.  - Continue medical management.  5. Chronic respiratory failure on home O2 -sats in 80s yesterday at rest. Likely driving [place]. Needs to be compliant with using O2.  6. Mitral regurgitation -prominent v waves on PCWP tracing at cath. Echo reviewed. MR mild to moderate at worst.    Improved. Stable for d/c. Volume ok. Will need to use home O2 more. Will refer for pulmonary rehab. I will see him in 2-3 weeks in the office to f/u.    Length of Stay: 3   Glori Bickers MD 04/19/2016, 8:35 AM  Advanced Heart Failure Team Pager [phone] (M-F; 7a - 4p)  Please contact [facility] for night-coverage after hours (4p -7a ) and weekends on [email or web address]

## 2016-04-19 NOTE — Progress Notes (Signed)
CARDIAC REHAB PHASE I  Pt awaiting discharge, declines ambulation at this time. Gave pt pulmonary rehab brochure, they will be contacting him regarding referral to the program. Pt in recliner, call bell within reach.   Lenna Sciara, RN, BSN 04/19/2016 11:09 AM

## 2016-04-24 ENCOUNTER — Encounter: Payer: Self-pay | Admitting: Cardiology

## 2016-04-25 ENCOUNTER — Ambulatory Visit (HOSPITAL_COMMUNITY)
Admit: 2016-04-25 | Discharge: 2016-04-25 | Disposition: A | Discharge status: Home / Self Care | Payer: Medicare Other | Source: Ambulatory Visit | Attending: Nurse Practitioner | Admitting: Nurse Practitioner

## 2016-04-25 ENCOUNTER — Other Ambulatory Visit (HOSPITAL_COMMUNITY): Payer: Self-pay | Admitting: *Deleted

## 2016-04-25 ENCOUNTER — Encounter (HOSPITAL_COMMUNITY): Payer: Self-pay | Admitting: Nurse Practitioner

## 2016-04-25 VITALS — BP 142/74 | HR 70 | Ht 71.0 in | Wt 214.4 lb

## 2016-04-25 DIAGNOSIS — I4891 Unspecified atrial fibrillation: Secondary | ICD-10-CM | POA: Insufficient documentation

## 2016-04-25 DIAGNOSIS — I481 Persistent atrial fibrillation: Secondary | ICD-10-CM | POA: Diagnosis not present

## 2016-04-25 DIAGNOSIS — Z823 Family history of stroke: Secondary | ICD-10-CM | POA: Insufficient documentation

## 2016-04-25 DIAGNOSIS — I4819 Other persistent atrial fibrillation: Secondary | ICD-10-CM

## 2016-04-25 DIAGNOSIS — K219 Gastro-esophageal reflux disease without esophagitis: Secondary | ICD-10-CM | POA: Insufficient documentation

## 2016-04-25 DIAGNOSIS — Z881 Allergy status to other antibiotic agents status: Secondary | ICD-10-CM | POA: Insufficient documentation

## 2016-04-25 DIAGNOSIS — G4733 Obstructive sleep apnea (adult) (pediatric): Secondary | ICD-10-CM | POA: Insufficient documentation

## 2016-04-25 DIAGNOSIS — G2581 Restless legs syndrome: Secondary | ICD-10-CM | POA: Insufficient documentation

## 2016-04-25 DIAGNOSIS — E785 Hyperlipidemia, unspecified: Secondary | ICD-10-CM | POA: Insufficient documentation

## 2016-04-25 DIAGNOSIS — E039 Hypothyroidism, unspecified: Secondary | ICD-10-CM | POA: Diagnosis not present

## 2016-04-25 DIAGNOSIS — R7303 Prediabetes: Secondary | ICD-10-CM | POA: Insufficient documentation

## 2016-04-25 DIAGNOSIS — Z7984 Long term (current) use of oral hypoglycemic drugs: Secondary | ICD-10-CM | POA: Diagnosis not present

## 2016-04-25 DIAGNOSIS — I110 Hypertensive heart disease with heart failure: Secondary | ICD-10-CM | POA: Insufficient documentation

## 2016-04-25 DIAGNOSIS — Z79899 Other long term (current) drug therapy: Secondary | ICD-10-CM | POA: Insufficient documentation

## 2016-04-25 DIAGNOSIS — I5032 Chronic diastolic (congestive) heart failure: Secondary | ICD-10-CM | POA: Insufficient documentation

## 2016-04-25 DIAGNOSIS — Z7951 Long term (current) use of inhaled steroids: Secondary | ICD-10-CM | POA: Insufficient documentation

## 2016-04-25 DIAGNOSIS — Z7901 Long term (current) use of anticoagulants: Secondary | ICD-10-CM | POA: Diagnosis not present

## 2016-04-25 DIAGNOSIS — Z87891 Personal history of nicotine dependence: Secondary | ICD-10-CM | POA: Insufficient documentation

## 2016-04-25 DIAGNOSIS — Z8249 Family history of ischemic heart disease and other diseases of the circulatory system: Secondary | ICD-10-CM | POA: Diagnosis not present

## 2016-04-25 DIAGNOSIS — Z950 Presence of cardiac pacemaker: Secondary | ICD-10-CM | POA: Diagnosis not present

## 2016-04-25 DIAGNOSIS — M797 Fibromyalgia: Secondary | ICD-10-CM | POA: Diagnosis not present

## 2016-04-25 DIAGNOSIS — E119 Type 2 diabetes mellitus without complications: Secondary | ICD-10-CM | POA: Diagnosis not present

## 2016-04-25 LAB — MAGNESIUM: Magnesium: 2.1 mg/dL (ref 1.7–2.4)

## 2016-04-25 LAB — BASIC METABOLIC PANEL
Anion gap: 12 (ref 5–15)
BUN: 24 mg/dL — ABNORMAL HIGH (ref 6–20)
CO2: 27 mmol/L (ref 22–32)
Calcium: 9.5 mg/dL (ref 8.9–10.3)
Chloride: 98 mmol/L — ABNORMAL LOW (ref 101–111)
Creatinine, Ser: 1.63 mg/dL — ABNORMAL HIGH (ref 0.61–1.24)
GFR calc Af Amer: 49 mL/min — ABNORMAL LOW (ref >60)
GFR calc non Af Amer: 42 mL/min — ABNORMAL LOW (ref >60)
Glucose, Bld: 98 mg/dL (ref 65–99)
Potassium: 3.8 mmol/L (ref 3.5–5.1)
Sodium: 137 mmol/L (ref 135–145)

## 2016-04-25 MED ORDER — POTASSIUM CHLORIDE ER 10 MEQ PO TBCR: 20.0000 meq | EXTENDED_RELEASE_TABLET | Freq: Two times a day (BID) | ORAL | 6 refills | Status: DC

## 2016-04-25 NOTE — Progress Notes (Signed)
Primary Care Physician: Velna Hatchet, MD Referring Physician: [facility] f/u EP: Dr. Rayann Heman Heart Failure cardiologist: Dr. Virgina Jock is a 65 y.o. male with atrial fibrillation, h/o chronic lung disease, pulmonary HTN, chronic CHF(diastolic), DM, HTN, AVR with maze procedure, LAA clipping,/08/22/15 and 2 previous ablations. He was recently in the hospital for tikosyn loading and is in the afib clinic for f/u. He is in a paced rhyme today with qtc of 503 ms, shorter than on observed EKG's in the hospital. 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 He does not feel any better in SR. Being compliant with tikosyn.  Today, he denies symptoms of palpitations, chest pain, shortness of breath, orthopnea, PND, lower extremity edema, dizziness, presyncope, syncope, or neurologic sequela. The patient is tolerating medications without difficulties and is otherwise without complaint today.   Past Medical History:  Diagnosis Date  . Aortic stenosis, mild   . Arthritis of hand    "just a little bit in both hands" (03/31/2012)  . Asthma    "little bit" (03/31/2012)  . Atrial fibrillation [facility])    dx '04; DCCV '04, placed on flecainide, failed DCCV 04/2010, flecainide stopped; s/p successful A.fib ablation 01/31/12  . CHF (congestive heart failure) ([place])   . Diabetes mellitus without complication (HCC)    borderline  . DJD (degenerative joint disease)   . Exertional dyspnea   . Fibromyalgia   . First degree AV block    post ablation, ~495ms  . GERD (gastroesophageal reflux disease)   . Heart murmur   . Hyperlipidemia   . Hypertension   . Hypothyroidism    S/P radiation  . Left atrial enlargement    LA size 72mm by echo 11/21/11  . Mitral regurgitation    trivial  . Obesity   . Obstructive sleep apnea    mild by sleep study 2013; pt stated he does not have a machine because "it wasn't bad enough for him to have one"  . Pacemaker 03/31/2012  . Restless leg syndrome   . Second degree AV block   . Visit  for monitoring Tikosyn therapy 04/16/2016   Past Surgical History:  Procedure Laterality Date  . AORTIC VALVE REPLACEMENT N/A 08/22/2015   Procedure: AORTIC VALVE REPLACEMENT (AVR);  Surgeon: Ivin Poot, MD;  Location: [place];  Service: Open Heart Surgery;  Laterality: N/A;  . ATRIAL FIBRILLATION ABLATION  01/30/2012   PVI by Dr. Rayann Heman  . ATRIAL FIBRILLATION ABLATION N/A 01/31/2012   Procedure: ATRIAL FIBRILLATION ABLATION;  Surgeon: Thompson Grayer, MD;  Location[facility] CATH LAB;  Service: Cardiovascular;  Laterality: N/A;  . BACK SURGERY     X 3  . CARDIAC CATHETERIZATION N/A 08/09/2015   Procedure: Right/Left Heart Cath and Coronary Angiography;  Surgeon: Peter M Martinique, MD;  Location: [place] CV LAB;  Service: Cardiovascular;  Laterality: N/A;  . CARDIAC CATHETERIZATION N/A 02/05/2016   Procedure: Right/Left Heart Cath and Coronary Angiography;  Surgeon: Jettie Booze, MD;  Location: [place] CV LAB;  Service: Cardiovascular;  Laterality: N/A;  . CARDIAC CATHETERIZATION N/A 04/17/2016   Procedure: Right Heart Cath;  Surgeon: Jolaine Artist, MD;  Location: [place] CV LAB;  Service: Cardiovascular;  Laterality: N/A;  . CARDIOVASCULAR STRESS TEST  03/12/2002   EF 48%, NO EVIDENCE OF ISCHEMIA  . CARDIOVERSION  01/2012; 03/31/2012  . CARDIOVERSION N/A 02/25/2012   Procedure: CARDIOVERSION;  Surgeon: Thompson Grayer, MD;  Location: [facility] CATH LAB;  Service: Cardiovascular;  Laterality: N/A;  .  CARDIOVERSION N/A 03/31/2012   Procedure: CARDIOVERSION;  Surgeon: Thompson Grayer, MD;  Location: [facility] CATH LAB;  Service: Cardiovascular;  Laterality: N/A;  . CARDIOVERSION N/A 11/23/2015   Procedure: CARDIOVERSION;  Surgeon: Larey Dresser, MD;  Location: [facility] ENDOSCOPY;  Service: Cardiovascular;  Laterality: N/A;  . CARDIOVERSION N/A 04/08/2016   Procedure: CARDIOVERSION;  Surgeon: Jolaine Artist, MD;  Location: [facility] ENDOSCOPY;  Service: Cardiovascular;  Laterality: N/A;  . CLIPPING OF ATRIAL  APPENDAGE N/A 08/22/2015   Procedure: CLIPPING OF ATRIAL APPENDAGE;  Surgeon: Ivin Poot, MD;  Location: [place];  Service: Open Heart Surgery;  Laterality: N/A;  . DOPPLER ECHOCARDIOGRAPHY  03/11/2002   EF 70-75%  . FINGER SURGERY Left    Middle finger  . FINGER TENDON REPAIR  1980's   "right little finger" (03/31/2012)  . INSERT / REPLACE / REMOVE PACEMAKER     [employer]  . LEFT AND RIGHT HEART CATHETERIZATION WITH CORONARY ANGIOGRAM N/A 10/27/2012   Procedure: LEFT AND RIGHT HEART CATHETERIZATION WITH CORONARY ANGIOGRAM;  Surgeon: Laverda Page, MD;  Location: [facility] CATH LAB;  Service: Cardiovascular;  Laterality: N/A;  . LUMBAR DISC SURGERY  1980's X2;  2000's  . MAZE N/A 08/22/2015   Procedure: MAZE;  Surgeon: Ivin Poot, MD;  Location: [place];  Service: Open Heart Surgery;  Laterality: N/A;  . PACEMAKER INSERTION  03/31/2012   STJ Accent DR pacemaker implanted by Dr Rayann Heman  . PERMANENT PACEMAKER INSERTION N/A 03/31/2012   Procedure: PERMANENT PACEMAKER INSERTION;  Surgeon: Thompson Grayer, MD;  Location: [facility] CATH LAB;  Service: Cardiovascular;  Laterality: N/A;  . TEE WITHOUT CARDIOVERSION  01/30/2012   Procedure: TRANSESOPHAGEAL ECHOCARDIOGRAM (TEE);  Surgeon: Larey Dresser, MD;  Location: [place];  Service: Cardiovascular;  Laterality: N/A;  ablation next day  . TEE WITHOUT CARDIOVERSION N/A 08/22/2015   Procedure: TRANSESOPHAGEAL ECHOCARDIOGRAM (TEE);  Surgeon: Ivin Poot, MD;  Location: [place];  Service: Open Heart Surgery;  Laterality: N/A;  . US ECHOCARDIOGRAPHY  08/07/2009   EF 55-60%    Current Outpatient Prescriptions  Medication Sig Dispense Refill  . albuterol (PROVENTIL HFA;VENTOLIN HFA) 108 (90 Base) MCG/ACT inhaler Inhale 2 puffs into the lungs every 6 (six) hours as needed for wheezing or shortness of breath. 1 Inhaler 5  . amLODipine (NORVASC) 10 MG tablet Take 0.5 tablets (5 mg total) by mouth daily. 30 tablet 11  . apixaban (ELIQUIS) 5 MG TABS tablet Take 1  tablet (5 mg total) by mouth 2 (two) times daily. 30 tablet 3  . atorvastatin (LIPITOR) 40 MG tablet TAKE 1 TABLET(40 MG) BY MOUTH DAILY 90 tablet 0  . budesonide-formoterol (SYMBICORT) 160-4.5 MCG/ACT inhaler Inhale 2 puffs into the lungs 2 (two) times daily. 1 Inhaler 6  . Cholecalciferol (VITAMIN D) 2000 UNITS CAPS Take 2,000 Units by mouth daily.    [name] dofetilide (TIKOSYN) 250 MCG capsule Take 1 capsule (250 mcg total) by mouth 2 (two) times daily. 60 capsule 3  . fexofenadine (ALLEGRA) 180 MG tablet Take 1 tablet (180 mg total) by mouth daily. 30 tablet 1  . fluticasone (FLONASE) 50 MCG/ACT nasal spray Place into both nostrils 2 (two) times daily as needed for allergies or rhinitis.    [name] gabapentin (NEURONTIN) 300 MG capsule Take 900 mg by mouth at bedtime.   3  . guaiFENesin (MUCINEX) 600 MG 12 hr tablet Take 600 mg by mouth 2 (two) times daily.    [name] ipratropium (ATROVENT) 0.02 % nebulizer solution Take  2.5 mLs (0.5 mg total) by nebulization 4 (four) times daily. (Patient taking differently: Take 0.5 mg by nebulization every 6 (six) hours as needed. ) 300 mL 1  . levalbuterol (XOPENEX) 0.63 MG/3ML nebulizer solution Take 3 mLs (0.63 mg total) by nebulization every 6 (six) hours as needed for wheezing or shortness of breath. 360 mL 1  . levothyroxine (SYNTHROID, LEVOTHROID) 175 MCG tablet Take 175 mcg by mouth daily before breakfast.    . metFORMIN (GLUMETZA) 500 MG (MOD) 24 hr tablet Take 500 mg by mouth daily with breakfast.    . metoprolol (LOPRESSOR) 50 MG tablet Take 1 tablet (50 mg total) by mouth 2 (two) times daily. 60 tablet 11  . pantoprazole (PROTONIX) 40 MG tablet Take 40 mg by mouth daily.    . potassium chloride (K-DUR) 10 MEQ tablet Take 2 tablets (20 mEq total) by mouth daily. 60 tablet 6  . promethazine (PHENERGAN) 25 MG tablet Take 25 mg by mouth every 6 (six) hours as needed for nausea or vomiting.    [name] rOPINIRole (REQUIP) 1 MG tablet Take 1 tablet (1 mg total) by mouth daily.  90 tablet 2  . spironolactone (ALDACTONE) 25 MG tablet Take 1 tablet (25 mg total) by mouth daily. 90 tablet 3  . torsemide (DEMADEX) 20 MG tablet Take 2 tablets (40 mg total) by mouth daily. 60 tablet 6  . traMADol (ULTRAM) 50 MG tablet Take 50 mg by mouth daily. Reported on 10/17/2015    . traZODone (DESYREL) 50 MG tablet Take 50 mg by mouth at bedtime.    . valACYclovir (VALTREX) 1000 MG tablet Take 1 tablet by mouth every 12 (twelve) hours as needed (fever blisters). Reported on 10/17/2015     No current facility-administered medications for this encounter.     Allergies  Allergen Reactions  . Meloxicam Rash  . 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 syndrome 09/02/15, resolved with diphenhydramine and slowing of rate    Social History   Social History  . Marital status: Married    Spouse name: N/A  . Number of children: N/A  . Years of education: N/A   Occupational History  . Not on file.   Social History Main Topics  . Smoking status: Former Smoker    Packs/day: 0.50    Years: 15.00    Types: Cigarettes    Quit date: 07/18/1991  . Smokeless tobacco: Never Used  . Alcohol use Yes     Comment: occasional  . Drug use:     Types: Marijuana  . Sexual activity: Not Currently   Other Topics Concern  . Not on file   Social History Narrative   Lives in [place] with spouse.  Works as a Air traffic controller.    Family History  Problem Relation Age of Onset  . Heart failure Mother   . Stroke Father     ROS- All systems are reviewed and negative except as per the HPI above  Physical Exam: Vitals:   04/25/16 1332  BP: (!) 142/74  Pulse: 70  Weight: 214 lb 6.4 oz (97.3 kg)  Height: 5\' 11"  (1.803 m)   Wt Readings from Last 3 Encounters:  04/25/16 214 lb 6.4 oz (97.3 kg)  04/16/16 209 lb 9.6 oz (95.1 kg)  04/16/16 211 lb (95.7 kg)    Labs: Lab Results  Component Value Date   NA 135 04/19/2016   K 4.3 04/19/2016   CL 99 (L) 04/19/2016   CO2 27  04/19/2016   GLUCOSE 127 (H) 04/19/2016   BUN 30 (H) 04/19/2016   CREATININE 1.86 (H) 04/19/2016   CALCIUM 9.1 04/19/2016   MG 2.1 04/19/2016   Lab Results  Component Value Date   INR 1.5 (H) 02/02/2016   Lab Results  Component Value Date   CHOL 114 (L) 08/03/2015   HDL 30 (L) 08/03/2015   LDLCALC 62 08/03/2015   TRIG 111 08/03/2015     GEN- The patient is well appearing, alert and oriented x 3 today.   Head- normocephalic, atraumatic Eyes-  Sclera clear, conjunctiva pink Ears- hearing intact Oropharynx- clear Neck- supple, no JVP Lymph- no cervical lymphadenopathy Lungs- Clear to ausculation bilaterally, normal work of breathing Heart- Regular rate and rhythm, no murmurs, rubs or gallops, PMI not laterally displaced GI- soft, NT, ND, + BS Extremities- no clubbing, cyanosis, or edema MS- no significant deformity or atrophy Skin- no rash or lesion Psych- euthymic mood, full affect Neuro- strength and sensation are intact  EKG-atrial sensed and ventricular paced,at 70 bpm, pr int 216 ms, qrs int 160 ms, qtc 503 ms Epic records reviewed   Assessment and Plan: 1. Afib S/p tikosyn loading In rhythm but does not feel better General education re tikosyn given  Continue tikosyn 250 mg bid Continue eliquis 5 mg bid Bmet/mag today  2. CHF Fluid status/weight stable Reminded to make f/u with Dr. Haroldine Laws today per d/c instructions  afib  clinc as needed F/u Dr. Rayann Heman 1/22  Geroge Baseman. [place], [facility] [address] [place], [place] [address] [phone]

## 2016-04-25 NOTE — Patient Instructions (Signed)
Make follow up in 2-3 weeks with Dr. Haroldine Laws.

## 2016-04-26 ENCOUNTER — Other Ambulatory Visit: Payer: Self-pay | Admitting: Cardiovascular Disease

## 2016-05-01 LAB — CUP PACEART REMOTE DEVICE CHECK
Battery Remaining Longevity: 72 mo
Battery Remaining Percentage: 65 %
Battery Voltage: 2.9 V
Brady Statistic AP VP Percent: 8.9 %
Brady Statistic AP VS Percent: 1 %
Brady Statistic AS VP Percent: 91 %
Brady Statistic AS VS Percent: 1 %
Brady Statistic RA Percent Paced: 7.1 %
Brady Statistic RV Percent Paced: 99 %
Date Time Interrogation Session: 20171207194415
Implantable Lead Implant Date: 20131203
Implantable Lead Implant Date: 20131203
Implantable Lead Location: 753859
Implantable Lead Location: 753860
Implantable Lead Model: 1948
Implantable Pulse Generator Implant Date: 20131203
Lead Channel Impedance Value: 410 Ohm
Lead Channel Impedance Value: 580 Ohm
Lead Channel Pacing Threshold Amplitude: 1 V
Lead Channel Pacing Threshold Amplitude: 1.125 V
Lead Channel Pacing Threshold Pulse Width: 0.5 ms
Lead Channel Pacing Threshold Pulse Width: 0.8 ms
Lead Channel Sensing Intrinsic Amplitude: 4.2 mV
Lead Channel Sensing Intrinsic Amplitude: 8.2 mV
Lead Channel Setting Pacing Amplitude: 1.375
Lead Channel Setting Pacing Amplitude: 2 V
Lead Channel Setting Pacing Pulse Width: 0.8 ms
Lead Channel Setting Sensing Sensitivity: 4 mV
Pulse Gen Model: 2210
Pulse Gen Serial Number: 7408461

## 2016-05-01 NOTE — Progress Notes (Signed)
No charge. Pt scheduled w/ JA 04/11/2017

## 2016-05-02 ENCOUNTER — Ambulatory Visit (HOSPITAL_COMMUNITY)
Admission: RE | Admit: 2016-05-02 | Discharge: 2016-05-02 | Disposition: A | Discharge status: Home / Self Care | Payer: Medicare Other | Source: Ambulatory Visit | Attending: Nurse Practitioner | Admitting: Nurse Practitioner

## 2016-05-02 DIAGNOSIS — I480 Paroxysmal atrial fibrillation: Secondary | ICD-10-CM | POA: Diagnosis not present

## 2016-05-02 DIAGNOSIS — I4819 Other persistent atrial fibrillation: Secondary | ICD-10-CM

## 2016-05-02 DIAGNOSIS — I481 Persistent atrial fibrillation: Secondary | ICD-10-CM | POA: Insufficient documentation

## 2016-05-02 LAB — BASIC METABOLIC PANEL
Anion gap: 11 (ref 5–15)
BUN: 26 mg/dL — ABNORMAL HIGH (ref 6–20)
CO2: 27 mmol/L (ref 22–32)
Calcium: 9.3 mg/dL (ref 8.9–10.3)
Chloride: 99 mmol/L — ABNORMAL LOW (ref 101–111)
Creatinine, Ser: 1.82 mg/dL — ABNORMAL HIGH (ref 0.61–1.24)
GFR calc Af Amer: 43 mL/min — ABNORMAL LOW (ref >60)
GFR calc non Af Amer: 37 mL/min — ABNORMAL LOW (ref >60)
Glucose, Bld: 100 mg/dL — ABNORMAL HIGH (ref 65–99)
Potassium: 4.2 mmol/L (ref 3.5–5.1)
Sodium: 137 mmol/L (ref 135–145)

## 2016-05-02 MED ORDER — APIXABAN 5 MG PO TABS: 5.0000 mg | ORAL_TABLET | Freq: Two times a day (BID) | ORAL | 0 refills | Status: DC

## 2016-05-06 ENCOUNTER — Telehealth (HOSPITAL_COMMUNITY): Payer: Self-pay | Admitting: Vascular Surgery

## 2016-05-06 NOTE — Telephone Encounter (Signed)
Left pt message to resch appt 

## 2016-05-08 ENCOUNTER — Telehealth (HOSPITAL_COMMUNITY): Payer: Self-pay | Admitting: Pharmacist

## 2016-05-08 NOTE — Telephone Encounter (Signed)
[employer] patient assistance approved for Tikosyn 250 mcg BID through 04/28/17. It is currently on backorder but they will ship it once it is available. Patient did receive 1 month supply upon discharge on 12/22.   Ruta Hinds. Velva Harman, PharmD, BCPS, CPP Clinical Pharmacist Pager: [phone] Phone: [phone] 05/08/2016 11:38 AM

## 2016-05-14 ENCOUNTER — Telehealth (HOSPITAL_COMMUNITY): Payer: Self-pay | Admitting: Pharmacist

## 2016-05-14 NOTE — Telephone Encounter (Signed)
Tikosyn still not sent from [employer] patient assistance and Mr. Spratley will be out tomorrow. I have called [facility] who has a 30 day supply for him to hopefully hold him over until [employer] can start to send it through their patient assistance program. Mrs. Avanessian agreeable and grateful for the assistance.   Ruta Hinds. Velva Harman, PharmD, BCPS, CPP Clinical Pharmacist Pager: [phone] Phone: [phone] 05/14/2016 3:49 PM

## 2016-05-17 ENCOUNTER — Encounter (HOSPITAL_COMMUNITY): Payer: Medicare Other | Admitting: Internal Medicine

## 2016-05-20 ENCOUNTER — Encounter: Payer: Self-pay | Admitting: Internal Medicine

## 2016-05-20 ENCOUNTER — Ambulatory Visit (INDEPENDENT_AMBULATORY_CARE_PROVIDER_SITE_OTHER): Payer: Medicare Other | Admitting: Internal Medicine

## 2016-05-20 VITALS — BP 120/60 | HR 76 | Ht 71.0 in | Wt 218.0 lb

## 2016-05-20 DIAGNOSIS — I272 Pulmonary hypertension, unspecified: Secondary | ICD-10-CM | POA: Diagnosis not present

## 2016-05-20 DIAGNOSIS — I5032 Chronic diastolic (congestive) heart failure: Secondary | ICD-10-CM

## 2016-05-20 DIAGNOSIS — I481 Persistent atrial fibrillation: Secondary | ICD-10-CM | POA: Diagnosis not present

## 2016-05-20 DIAGNOSIS — I441 Atrioventricular block, second degree: Secondary | ICD-10-CM

## 2016-05-20 DIAGNOSIS — I4819 Other persistent atrial fibrillation: Secondary | ICD-10-CM

## 2016-05-20 DIAGNOSIS — R0602 Shortness of breath: Secondary | ICD-10-CM | POA: Diagnosis not present

## 2016-05-20 NOTE — Progress Notes (Signed)
Electrophysiology Office Note   Date:  05/20/2016   ID:  [name], DOB [birth year], MRN [id number]  PCP:  Velna Hatchet, MD  Cardiologist:  Dr Acie Fredrickson Primary Electrophysiologist: Thompson Grayer, MD   Sees Dr Haroldine Laws in CHF clinic also  CC: SOB (chronic)   History of Present Illness: [name] is a 66 y.o. male who presents today for electrophysiology evaluation.   Has chronic SOB, fatigue, and edema.  He has pulmonary hypertension and now wears O2.   He is s/p aortic valve replacement with LAA clip and MAZE 4/17.  He was recently admitted for tikosyn and is maintaining sinus rhythm.  Unfortunately, this has not help with his symptoms at all.  Today, he denies symptoms of palpitations, chest pain,  orthopnea, PND, claudication, dizziness, presyncope, syncope, bleeding, or neurologic sequela. The patient is tolerating medications without difficulties and is otherwise without complaint today.    Past Medical History:  Diagnosis Date  . Aortic stenosis, mild   . Arthritis of hand    "just a little bit in both hands" (03/31/2012)  . Asthma    "little bit" (03/31/2012)  . Atrial fibrillation [facility])    dx '04; DCCV '04, placed on flecainide, failed DCCV 04/2010, flecainide stopped; s/p successful A.fib ablation 01/31/12  . CHF (congestive heart failure) ([place])   . Diabetes mellitus without complication (HCC)    borderline  . DJD (degenerative joint disease)   . Exertional dyspnea   . Fibromyalgia   . First degree AV block    post ablation, ~458ms  . GERD (gastroesophageal reflux disease)   . Heart murmur   . Hyperlipidemia   . Hypertension   . Hypothyroidism    S/P radiation  . Left atrial enlargement    LA size 69mm by echo 11/21/11  . Mitral regurgitation    trivial  . Obesity   . Obstructive sleep apnea    mild by sleep study 2013; pt stated he does not have a machine because "it wasn't bad enough for him to have one"  . Pacemaker 03/31/2012  . Restless leg  syndrome   . Second degree AV block   . Visit for monitoring Tikosyn therapy 04/16/2016   Past Surgical History:  Procedure Laterality Date  . AORTIC VALVE REPLACEMENT N/A 08/22/2015   Procedure: AORTIC VALVE REPLACEMENT (AVR);  Surgeon: Ivin Poot, MD;  Location: [place];  Service: Open Heart Surgery;  Laterality: N/A;  . ATRIAL FIBRILLATION ABLATION  01/30/2012   PVI by Dr. Rayann Heman  . ATRIAL FIBRILLATION ABLATION N/A 01/31/2012   Procedure: ATRIAL FIBRILLATION ABLATION;  Surgeon: Thompson Grayer, MD;  Location: [facility] CATH LAB;  Service: Cardiovascular;  Laterality: N/A;  . BACK SURGERY     X 3  . CARDIAC CATHETERIZATION N/A 08/09/2015   Procedure: Right/Left Heart Cath and Coronary Angiography;  Surgeon: Peter M Martinique, MD;  Location: [place] CV LAB;  Service: Cardiovascular;  Laterality: N/A;  . CARDIAC CATHETERIZATION N/A 02/05/2016   Procedure: Right/Left Heart Cath and Coronary Angiography;  Surgeon: Jettie Booze, MD;  Location: [place] CV LAB;  Service: Cardiovascular;  Laterality: N/A;  . CARDIAC CATHETERIZATION N/A 04/17/2016   Procedure: Right Heart Cath;  Surgeon: Jolaine Artist, MD;  Location: [place] CV LAB;  Service: Cardiovascular;  Laterality: N/A;  . CARDIOVASCULAR STRESS TEST  03/12/2002   EF 48%, NO EVIDENCE OF ISCHEMIA  . CARDIOVERSION  01/2012; 03/31/2012  . CARDIOVERSION N/A 02/25/2012   Procedure: CARDIOVERSION;  Surgeon:  Thompson Grayer, MD;  Location: [facility] CATH LAB;  Service: Cardiovascular;  Laterality: N/A;  . CARDIOVERSION N/A 03/31/2012   Procedure: CARDIOVERSION;  Surgeon: Thompson Grayer, MD;  Location: [facility] CATH LAB;  Service: Cardiovascular;  Laterality: N/A;  . CARDIOVERSION N/A 11/23/2015   Procedure: CARDIOVERSION;  Surgeon: Larey Dresser, MD;  Location: [facility] ENDOSCOPY;  Service: Cardiovascular;  Laterality: N/A;  . CARDIOVERSION N/A 04/08/2016   Procedure: CARDIOVERSION;  Surgeon: Jolaine Artist, MD;  Location: [facility] ENDOSCOPY;  Service:  Cardiovascular;  Laterality: N/A;  . CLIPPING OF ATRIAL APPENDAGE N/A 08/22/2015   Procedure: CLIPPING OF ATRIAL APPENDAGE;  Surgeon: Ivin Poot, MD;  Location: [place];  Service: Open Heart Surgery;  Laterality: N/A;  . DOPPLER ECHOCARDIOGRAPHY  03/11/2002   EF 70-75%  . FINGER SURGERY Left    Middle finger  . FINGER TENDON REPAIR  1980's   "right little finger" (03/31/2012)  . INSERT / REPLACE / REMOVE PACEMAKER     [employer]  . LEFT AND RIGHT HEART CATHETERIZATION WITH CORONARY ANGIOGRAM N/A 10/27/2012   Procedure: LEFT AND RIGHT HEART CATHETERIZATION WITH CORONARY ANGIOGRAM;  Surgeon: Laverda Page, MD;  Location: [facility] CATH LAB;  Service: Cardiovascular;  Laterality: N/A;  . LUMBAR DISC SURGERY  1980's X2;  2000's  . MAZE N/A 08/22/2015   Procedure: MAZE;  Surgeon: Ivin Poot, MD;  Location: [place];  Service: Open Heart Surgery;  Laterality: N/A;  . PACEMAKER INSERTION  03/31/2012   STJ Accent DR pacemaker implanted by Dr Rayann Heman  . PERMANENT PACEMAKER INSERTION N/A 03/31/2012   Procedure: PERMANENT PACEMAKER INSERTION;  Surgeon: Thompson Grayer, MD;  Location: [facility] CATH LAB;  Service: Cardiovascular;  Laterality: N/A;  . TEE WITHOUT CARDIOVERSION  01/30/2012   Procedure: TRANSESOPHAGEAL ECHOCARDIOGRAM (TEE);  Surgeon: Larey Dresser, MD;  Location: [place];  Service: Cardiovascular;  Laterality: N/A;  ablation next day  . TEE WITHOUT CARDIOVERSION N/A 08/22/2015   Procedure: TRANSESOPHAGEAL ECHOCARDIOGRAM (TEE);  Surgeon: Ivin Poot, MD;  Location: [place];  Service: Open Heart Surgery;  Laterality: N/A;  . US ECHOCARDIOGRAPHY  08/07/2009   EF 55-60%     Current Outpatient Prescriptions  Medication Sig Dispense Refill  . albuterol (PROVENTIL HFA;VENTOLIN HFA) 108 (90 Base) MCG/ACT inhaler Inhale 2 puffs into the lungs every 6 (six) hours as needed for wheezing or shortness of breath. 1 Inhaler 5  . amLODipine (NORVASC) 10 MG tablet Take 0.5 tablets (5 mg total) by mouth daily. 30  tablet 11  . apixaban (ELIQUIS) 5 MG TABS tablet Take 1 tablet (5 mg total) by mouth 2 (two) times daily. 60 tablet 0  . atorvastatin (LIPITOR) 40 MG tablet Take 1 tablet (40 mg total) by mouth daily at 6 PM. 90 tablet 3  . budesonide-formoterol (SYMBICORT) 160-4.5 MCG/ACT inhaler Inhale 2 puffs into the lungs 2 (two) times daily. 1 Inhaler 6  . Cholecalciferol (VITAMIN D) 2000 UNITS CAPS Take 2,000 Units by mouth daily.    [name] dofetilide (TIKOSYN) 250 MCG capsule Take 1 capsule (250 mcg total) by mouth 2 (two) times daily. 60 capsule 3  . fexofenadine (ALLEGRA) 180 MG tablet Take 1 tablet (180 mg total) by mouth daily. 30 tablet 1  . fluticasone (FLONASE) 50 MCG/ACT nasal spray Place into both nostrils 2 (two) times daily as needed for allergies or rhinitis.    [name] gabapentin (NEURONTIN) 300 MG capsule Take 900 mg by mouth at bedtime.   3  . guaiFENesin (MUCINEX) 600 MG 12  hr tablet Take 600 mg by mouth 2 (two) times daily.    [name] levalbuterol (XOPENEX) 0.63 MG/3ML nebulizer solution Take 3 mLs (0.63 mg total) by nebulization every 6 (six) hours as needed for wheezing or shortness of breath. 360 mL 1  . levothyroxine (SYNTHROID, LEVOTHROID) 175 MCG tablet Take 175 mcg by mouth daily before breakfast.    . metFORMIN (GLUMETZA) 500 MG (MOD) 24 hr tablet Take 500 mg by mouth daily with breakfast.    . metoprolol (LOPRESSOR) 50 MG tablet Take 1 tablet (50 mg total) by mouth 2 (two) times daily. 60 tablet 11  . pantoprazole (PROTONIX) 40 MG tablet Take 40 mg by mouth daily.    . potassium chloride (K-DUR) 10 MEQ tablet Take 2 tablets (20 mEq total) by mouth 2 (two) times daily. 60 tablet 6  . promethazine (PHENERGAN) 25 MG tablet Take 25 mg by mouth every 6 (six) hours as needed for nausea or vomiting.    [name] rOPINIRole (REQUIP) 1 MG tablet Take 1 tablet (1 mg total) by mouth daily. 90 tablet 2  . spironolactone (ALDACTONE) 25 MG tablet Take 1 tablet (25 mg total) by mouth daily. 90 tablet 3  . torsemide  (DEMADEX) 20 MG tablet Take 2 tablets (40 mg total) by mouth daily. 60 tablet 6  . traMADol (ULTRAM) 50 MG tablet Take 50 mg by mouth daily. Reported on 10/17/2015    . traZODone (DESYREL) 50 MG tablet Take 25 mg by mouth at bedtime.    . valACYclovir (VALTREX) 1000 MG tablet Take 1 tablet by mouth every 12 (twelve) hours as needed (fever blisters). Reported on 10/17/2015     No current facility-administered medications for this visit.     Allergies:   Meloxicam and Vancomycin   Social History:  The patient  reports that he quit smoking about 24 years ago. His smoking use included Cigarettes. He has a 7.50 pack-year smoking history. He has never used smokeless tobacco. He reports that he drinks alcohol. He reports that he uses drugs, including Marijuana.   Family History:  The patient's family history includes Heart failure in his mother; Stroke in his father.    ROS:  Please see the history of present illness.   All other systems are reviewed and negative.    PHYSICAL EXAM: VS:  BP 120/60 (BP Location: Left Arm, Patient Position: Sitting, Cuff Size: Large)   Pulse 76   Ht 5\' 11"  (1.803 m)   Wt 218 lb (98.9 kg)   BMI 30.40 kg/m  , BMI Body mass index is 30.4 kg/m. GEN: Well nourished, well developed, in no acute distress  HEENT: normal  Neck: no JVD, carotid bruits, or masses Cardiac: RRR (paced), loud P2 Respiratory:  Coarse BS at bases, diffuse expiratory wheezes, normal work of breathing GI: soft, nontender, nondistended, + BS MS: no deformity or atrophy  Skin: warm and dry, device pocket is well healed Neuro:  Strength and sensation are intact Psych: euthymic mood, full affect  Device interrogation is reviewed today in detail.  See PaceArt for details.   Recent Labs: 09/04/2015: TSH 0.441 01/15/2016: Pro B Natriuretic peptide (BNP) 470.0 01/29/2016: ALT 14 02/29/2016: B Natriuretic Peptide 181.9 04/17/2016: Hemoglobin 9.0; Platelets 190 04/25/2016: Magnesium 2.1 05/02/2016:  BUN 26; Creatinine, Ser 1.82; Potassium 4.2; Sodium 137    Lipid Panel     Component Value Date/Time   CHOL 114 (L) 08/03/2015 1036   TRIG 111 08/03/2015 1036   HDL 30 (L) 08/03/2015 1036  CHOLHDL 3.8 08/03/2015 1036   VLDL 22 08/03/2015 1036   LDLCALC 62 08/03/2015 1036     Wt Readings from Last 3 Encounters:  05/20/16 218 lb (98.9 kg)  04/25/16 214 lb 6.4 oz (97.3 kg)  04/16/16 209 lb 9.6 oz (95.1 kg)     ASSESSMENT AND PLAN:  1. Second degree AV block Normal pacemaker function See Pace Art report No changes  2. SOB Chronic and of an unclear etiology Not improved with sinus rhythm He has chronic lung disease.  I believe that pulmonary rehab would be very beneficial and have strongly encouraged him to pursue this.  3. afib Remains on eliquis S/p recent MAZE and LAA clipping Now on tikosyn If he returns to afib, I would favor rate control long term.  I dont think that he really feels any different with sinus rhythm.   4. htn Stable  Follow-up with Butch Penny in the AF clinic every 3 months for tikosyn management Follow-up with CHF clinic and pulmonary clinic as scheduled Merlin Return to see me in a year   Current medicines are reviewed at length with the patient today.   The patient does not have concerns regarding his medicines.  The following changes were made today:  none  Signed, Thompson Grayer, MD  05/20/2016 9:53 AM     [facility] [address] [place] [address] [phone] (office) [phone] (fax)

## 2016-05-20 NOTE — Patient Instructions (Signed)
Medication Instructions:  Your physician recommends that you continue on your current medications as directed. Please refer to the Current Medication list given to you today.   Labwork: None ordered   Testing/Procedures: None ordered   Follow-Up: Your physician recommends that you schedule a follow-up appointment in: 3 months with Roderic Palau, NP and 12 months with Dr Rayann Heman   Any Other Special Instructions Will Be Listed Below (If Applicable).     If you need a refill on your cardiac medications before your next appointment, please call your pharmacy.

## 2016-05-22 ENCOUNTER — Encounter (HOSPITAL_COMMUNITY): Payer: Medicare Other

## 2016-05-23 ENCOUNTER — Ambulatory Visit (HOSPITAL_COMMUNITY)
Admission: RE | Admit: 2016-05-23 | Discharge: 2016-05-23 | Disposition: A | Discharge status: Home / Self Care | Payer: Medicare Other | Source: Ambulatory Visit | Attending: Internal Medicine | Admitting: Internal Medicine

## 2016-05-23 VITALS — BP 142/60 | HR 91 | Wt 216.4 lb

## 2016-05-23 DIAGNOSIS — Z7901 Long term (current) use of anticoagulants: Secondary | ICD-10-CM | POA: Diagnosis not present

## 2016-05-23 DIAGNOSIS — E039 Hypothyroidism, unspecified: Secondary | ICD-10-CM | POA: Insufficient documentation

## 2016-05-23 DIAGNOSIS — J9611 Chronic respiratory failure with hypoxia: Secondary | ICD-10-CM | POA: Insufficient documentation

## 2016-05-23 DIAGNOSIS — Z888 Allergy status to other drugs, medicaments and biological substances status: Secondary | ICD-10-CM | POA: Insufficient documentation

## 2016-05-23 DIAGNOSIS — Z7984 Long term (current) use of oral hypoglycemic drugs: Secondary | ICD-10-CM | POA: Insufficient documentation

## 2016-05-23 DIAGNOSIS — K219 Gastro-esophageal reflux disease without esophagitis: Secondary | ICD-10-CM | POA: Diagnosis not present

## 2016-05-23 DIAGNOSIS — M797 Fibromyalgia: Secondary | ICD-10-CM | POA: Insufficient documentation

## 2016-05-23 DIAGNOSIS — I110 Hypertensive heart disease with heart failure: Secondary | ICD-10-CM | POA: Diagnosis present

## 2016-05-23 DIAGNOSIS — Z9889 Other specified postprocedural states: Secondary | ICD-10-CM | POA: Diagnosis not present

## 2016-05-23 DIAGNOSIS — I350 Nonrheumatic aortic (valve) stenosis: Secondary | ICD-10-CM | POA: Diagnosis not present

## 2016-05-23 DIAGNOSIS — Z950 Presence of cardiac pacemaker: Secondary | ICD-10-CM | POA: Insufficient documentation

## 2016-05-23 DIAGNOSIS — I2510 Atherosclerotic heart disease of native coronary artery without angina pectoris: Secondary | ICD-10-CM | POA: Insufficient documentation

## 2016-05-23 DIAGNOSIS — I5022 Chronic systolic (congestive) heart failure: Secondary | ICD-10-CM | POA: Diagnosis not present

## 2016-05-23 DIAGNOSIS — I10 Essential (primary) hypertension: Secondary | ICD-10-CM | POA: Diagnosis not present

## 2016-05-23 DIAGNOSIS — Z8249 Family history of ischemic heart disease and other diseases of the circulatory system: Secondary | ICD-10-CM | POA: Insufficient documentation

## 2016-05-23 DIAGNOSIS — Z79899 Other long term (current) drug therapy: Secondary | ICD-10-CM | POA: Diagnosis not present

## 2016-05-23 DIAGNOSIS — Z823 Family history of stroke: Secondary | ICD-10-CM | POA: Diagnosis not present

## 2016-05-23 DIAGNOSIS — E785 Hyperlipidemia, unspecified: Secondary | ICD-10-CM | POA: Insufficient documentation

## 2016-05-23 DIAGNOSIS — I340 Nonrheumatic mitral (valve) insufficiency: Secondary | ICD-10-CM | POA: Insufficient documentation

## 2016-05-23 DIAGNOSIS — I5032 Chronic diastolic (congestive) heart failure: Secondary | ICD-10-CM | POA: Insufficient documentation

## 2016-05-23 DIAGNOSIS — E119 Type 2 diabetes mellitus without complications: Secondary | ICD-10-CM | POA: Diagnosis not present

## 2016-05-23 DIAGNOSIS — E669 Obesity, unspecified: Secondary | ICD-10-CM | POA: Diagnosis not present

## 2016-05-23 DIAGNOSIS — I480 Paroxysmal atrial fibrillation: Secondary | ICD-10-CM | POA: Diagnosis not present

## 2016-05-23 DIAGNOSIS — I272 Pulmonary hypertension, unspecified: Secondary | ICD-10-CM | POA: Diagnosis not present

## 2016-05-23 DIAGNOSIS — G4733 Obstructive sleep apnea (adult) (pediatric): Secondary | ICD-10-CM | POA: Diagnosis not present

## 2016-05-23 DIAGNOSIS — Z87891 Personal history of nicotine dependence: Secondary | ICD-10-CM | POA: Insufficient documentation

## 2016-05-23 DIAGNOSIS — J9610 Chronic respiratory failure, unspecified whether with hypoxia or hypercapnia: Secondary | ICD-10-CM | POA: Insufficient documentation

## 2016-05-23 DIAGNOSIS — G2581 Restless legs syndrome: Secondary | ICD-10-CM | POA: Insufficient documentation

## 2016-05-23 LAB — BASIC METABOLIC PANEL
Anion gap: 10 (ref 5–15)
BUN: 21 mg/dL — ABNORMAL HIGH (ref 6–20)
CO2: 27 mmol/L (ref 22–32)
Calcium: 9.3 mg/dL (ref 8.9–10.3)
Chloride: 98 mmol/L — ABNORMAL LOW (ref 101–111)
Creatinine, Ser: 1.76 mg/dL — ABNORMAL HIGH (ref 0.61–1.24)
GFR calc Af Amer: 44 mL/min — ABNORMAL LOW (ref >60)
GFR calc non Af Amer: 38 mL/min — ABNORMAL LOW (ref >60)
Glucose, Bld: 167 mg/dL — ABNORMAL HIGH (ref 65–99)
Potassium: 3.9 mmol/L (ref 3.5–5.1)
Sodium: 135 mmol/L (ref 135–145)

## 2016-05-23 LAB — MAGNESIUM: Magnesium: 2.2 mg/dL (ref 1.7–2.4)

## 2016-05-23 LAB — BRAIN NATRIURETIC PEPTIDE: B Natriuretic Peptide: 236.9 pg/mL — ABNORMAL HIGH (ref 0.0–100.0)

## 2016-05-23 MED ORDER — POTASSIUM CHLORIDE ER 10 MEQ PO TBCR: 20.0000 meq | EXTENDED_RELEASE_TABLET | Freq: Two times a day (BID) | ORAL | 3 refills | Status: DC

## 2016-05-23 MED ORDER — APIXABAN 5 MG PO TABS: 5.0000 mg | ORAL_TABLET | Freq: Two times a day (BID) | ORAL | 3 refills | Status: DC

## 2016-05-23 NOTE — Progress Notes (Addendum)
Advanced Heart Failure Medication Review by a Pharmacist  Does the patient  feel that his/her medications are working for him/her?  yes  Has the patient been experiencing any side effects to the medications prescribed?  no  Does the patient measure his/her own blood pressure or blood glucose at home?  no   Does the patient have any problems obtaining medications due to transportation or finances?   no  Understanding of regimen: good Understanding of indications: good Potential of compliance: good Patient understands to avoid NSAIDs. Patient understands to avoid decongestants.  Issues to address at subsequent visits: None   Pharmacist comments: [name] is a pleasant 66 yo M presenting with his wife and without a medication list but with good recall of his regimen including dosages. He reports good compliance with his regimen. [employer] patient assistance program for an update on Tikosyn 250 mcg backorder. They stated that it is still on backorder but they should have a better idea about when they will be receiving more by the end of the month.    Ruta Hinds. Velva Harman, PharmD, BCPS, CPP Clinical Pharmacist Pager: [phone] Phone: [phone] 05/23/2016 11:39 AM       Time with patient: 10 minutes Preparation and documentation time: 2 minutes Total time: 12 minutes

## 2016-05-23 NOTE — Progress Notes (Signed)
Advanced Heart Failure Clinic Note   Primary Care: Velna Hatchet, MD Primary Cardiologist: Dr. Acie Fredrickson Primary HF: Dr. Haroldine Laws   HPI:  [name] is a 66 y.o. 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   Admitted from Dr. Acie Fredrickson office 02/02/16 with severe DOE with any exercise or walking 10-20 feet. Pt admitted and planned for [facility] 02/05/16. Diuresed with IV lasix with rapid improvement of symptoms. Overall diuresed 15.2 L and down 14 lbs. Discharge weight was 205 lbs.  Echo 02/02/16 LVEF 60-65%, Grade 3 DD. Mild AI, Mild MR, RV mildly dilated and severely reduced. RA mildly dilated. PA peak pressure 74 mm Hg. Concerns for restrictive cardiomyopathy.  L/RHC 02/05/16 with no significant CAD. RHC showed significant difference between PCWP (50) and LVEDP (30) suggestive of MS but no MS on Echo. Weight at d/c was 205 pounds.  Echo 02/08/16 LVEF 55-60%, Restrictive physiology, (?)RV normal. No assessment of PA pressures.  Admitted 12/19 -> 04/19/16 for Tikosyn load. RHC completed during that admission as below.   He presents today for post hospital follow up. Saw Dr. Rayann Heman earlier this week, has remained in NSR since recent hospitalization. Still remains very fatigued and "wiped out" if he is very active. 02 was 85% walking into clinic but improved with rest. Wears 02 at home, only uses 2 L. Denies lightheadedness or dizziness. Looks pale per his wife.  Weight at home 208-210. Still having leg/thigh pain. Better with exertion.  Sleeps in recliner. Denies peripheral edema. SBPs has been in 110-120s at home.   EKG A-sensed V-Paced 68 bpm  RHC 04/17/16 RA = 13 RV = 66/3/15 PA = 69/23 (42) PCW = 21 (v=38) Fick cardiac output/index = 6.3/2.9 PVR = 3.8 WU Ao sat = 87% (radial artery ABG) PA sat = 43%, 43% SVC sat = 50%   L/RHC 02/05/16 - Mid RCA lesion,  10 %stenosed. - Ost Cx to Prox Cx lesion, 20 %stenosed. - LV end diastolic pressure is moderately elevated. - Hemodynamic findings consistent with severe pulmonary hypertension. - No evidence of discordance on the LV/RV tracing. No evidence of pericardial constriction. - Severly elevated PCWP. - PA sat 51%. CO 6.2 L/min. Cardiac index 2.87. - No significant aortic valve gradient. - There is no aortic valve stenosis.  RHC Procedural Findings: Hemodynamics (mmHg) RA mean 30 RV 87/9 (26) PA 100/56 (72) PCWP 51 LVEDP 31 AO 142/73 (100) Cardiac Output (Fick) 6.21 Cardiac Index (Fick) 2.87 PVR = 3.5 WU    PFTs 4/17  FEV1  1.6 (47%) FVC 2.25 (49%) DLCO 54%   Past Medical History:  Diagnosis Date  . Aortic stenosis, mild   . Arthritis of hand    "just a little bit in both hands" (03/31/2012)  . Asthma    "little bit" (03/31/2012)  . Atrial fibrillation [facility])    dx '04; DCCV '04, placed on flecainide, failed DCCV 04/2010, flecainide stopped; s/p successful A.fib ablation 01/31/12  . CHF (congestive heart failure) ([place])   . Diabetes mellitus without complication (HCC)    borderline  . DJD (degenerative joint disease)   . Exertional dyspnea   . Fibromyalgia   . First degree AV block    post ablation, ~447ms  . GERD (gastroesophageal reflux disease)   . Heart murmur   . Hyperlipidemia   . Hypertension   . Hypothyroidism  S/P radiation  . Left atrial enlargement    LA size 73mm by echo 11/21/11  . Mitral regurgitation    trivial  . Obesity   . Obstructive sleep apnea    mild by sleep study 2013; pt stated he does not have a machine because "it wasn't bad enough for him to have one"  . Pacemaker 03/31/2012  . Restless leg syndrome   . Second degree AV block   . Visit for monitoring Tikosyn therapy 04/16/2016    Current Outpatient Prescriptions  Medication Sig Dispense Refill  . albuterol (PROVENTIL HFA;VENTOLIN HFA) 108 (90 Base) MCG/ACT inhaler Inhale 2 puffs into  the lungs every 6 (six) hours as needed for wheezing or shortness of breath. 1 Inhaler 5  . amLODipine (NORVASC) 10 MG tablet Take 0.5 tablets (5 mg total) by mouth daily. 30 tablet 11  . apixaban (ELIQUIS) 5 MG TABS tablet Take 1 tablet (5 mg total) by mouth 2 (two) times daily. 180 tablet 3  . atorvastatin (LIPITOR) 40 MG tablet Take 1 tablet (40 mg total) by mouth daily at 6 PM. 90 tablet 3  . budesonide-formoterol (SYMBICORT) 160-4.5 MCG/ACT inhaler Inhale 2 puffs into the lungs 2 (two) times daily. 1 Inhaler 6  . Cholecalciferol (VITAMIN D) 2000 UNITS CAPS Take 2,000 Units by mouth daily.    [name] dofetilide (TIKOSYN) 250 MCG capsule Take 1 capsule (250 mcg total) by mouth 2 (two) times daily. 60 capsule 3  . fexofenadine (ALLEGRA) 180 MG tablet Take 1 tablet (180 mg total) by mouth daily. 30 tablet 1  . fluticasone (FLONASE) 50 MCG/ACT nasal spray Place into both nostrils 2 (two) times daily as needed for allergies or rhinitis.    [name] gabapentin (NEURONTIN) 300 MG capsule Take 900 mg by mouth at bedtime.   3  . guaiFENesin (MUCINEX) 600 MG 12 hr tablet Take 600 mg by mouth 2 (two) times daily.    [name] levothyroxine (SYNTHROID, LEVOTHROID) 175 MCG tablet Take 175 mcg by mouth daily before breakfast.    . metFORMIN (GLUMETZA) 500 MG (MOD) 24 hr tablet Take 500 mg by mouth daily with breakfast.    . metoprolol (LOPRESSOR) 50 MG tablet Take 1 tablet (50 mg total) by mouth 2 (two) times daily. 60 tablet 11  . pantoprazole (PROTONIX) 40 MG tablet Take 40 mg by mouth daily.    . potassium chloride (K-DUR) 10 MEQ tablet Take 2 tablets (20 mEq total) by mouth 2 (two) times daily. 360 tablet 3  . promethazine (PHENERGAN) 25 MG tablet Take 25 mg by mouth every 6 (six) hours as needed for nausea or vomiting.    [name] rOPINIRole (REQUIP) 1 MG tablet Take 1 tablet (1 mg total) by mouth daily. 90 tablet 2  . spironolactone (ALDACTONE) 25 MG tablet Take 1 tablet (25 mg total) by mouth daily. 90 tablet 3  . torsemide  (DEMADEX) 20 MG tablet Take 2 tablets (40 mg total) by mouth daily. 60 tablet 6  . traMADol (ULTRAM) 50 MG tablet Take 50 mg by mouth daily. Reported on 10/17/2015    . traZODone (DESYREL) 50 MG tablet Take 25 mg by mouth at bedtime.    . valACYclovir (VALTREX) 1000 MG tablet Take 1 tablet by mouth every 12 (twelve) hours as needed (fever blisters). Reported on 10/17/2015    . levalbuterol (XOPENEX) 0.63 MG/3ML nebulizer solution Take 3 mLs (0.63 mg total) by nebulization every 6 (six) hours as needed for wheezing or shortness of breath. (Patient not taking:  Reported on 05/23/2016) 360 mL 1   No current facility-administered medications for this encounter.     Allergies  Allergen Reactions  . Meloxicam Rash  . 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 syndrome 09/02/15, resolved with diphenhydramine and slowing of rate      Social History   Social History  . Marital status: Married    Spouse name: N/A  . Number of children: N/A  . Years of education: N/A   Occupational History  . Not on file.   Social History Main Topics  . Smoking status: Former Smoker    Packs/day: 0.50    Years: 15.00    Types: Cigarettes    Quit date: 07/18/1991  . Smokeless tobacco: Never Used  . Alcohol use Yes     Comment: occasional  . Drug use: Yes    Types: Marijuana  . Sexual activity: Not Currently   Other Topics Concern  . Not on file   Social History Narrative   Lives in [place] with spouse.  Works as a Air traffic controller.    Family History  Problem Relation Age of Onset  . Heart failure Mother   . Stroke Father     Vitals:   05/23/16 1129  BP: (!) 142/60  Pulse: 91  SpO2: (!) 89%  Weight: 216 lb 6.4 oz (98.2 kg)    Wt Readings from Last 3 Encounters:  05/23/16 216 lb 6.4 oz (98.2 kg)  05/20/16 218 lb (98.9 kg)  04/25/16 214 lb 6.4 oz (97.3 kg)     PHYSICAL EXAM: General: Seated in chair. NAD.   HEENT: Normal. On 02 via [place] Neck: Supple. JVP 6-7 cm. Carotids 2+  bilat; no bruits. No thyromegaly or nodule noted.    Cor: PMI nondisplaced. RRR. No M/G/R.  Lungs: Decreased throughout. No wheezes noted.  Abdomen: soft, NT, ND, no HSM. No bruits or masses. +BS  Umbilical hernia present. Extremities: no cyanosis, clubbing, rash. No peripheral edema.  Neuro: alert & orientedx3, cranial nerves grossly intact. moves all 4 extremities w/o difficulty. Affect pleasant  ASSESSMENT & PLAN:   1. Chronic diastolic CHF with RV failure - Echo 02/02/16 LVEF 60-65%, Grade 3 DD. Mild AI, Mild MR, RV mildly dilated and severely reduced. RA mildly dilated. PA peak pressure 74 mm Hg. Concerns for restrictive cardiomyopathy. - Echo 02/08/16 LVEF 55-60%, Restrictive physiology, (?)RV normal. No assessment of PA pressures. - Volume status stable on exam.    - Continue torsemide 40 mg daily. Can take 20 mg extra as needed.  BMET/BNP today.  - Continue spiro 25 mg daily.  - Continue amlodipine 5 mg daily. - Continue lopressor 50 mg BID  2. Paroxysmal Afib s/p AF ablation/Maze s/p pacemaker - Seen by Dr. Rayann Heman 05/20/16. Remains in NSR s/p recent hospitalization with Tikosyn initiation.  - Remains in NSR by EKG today.  - Continue Eliquis 2.5 mg BID.  3. AS s/p AVR - Stable bioprosthesis with mild regurgitation on Echo 02/08/16. No change.  4. Pulmonary HTN - suspect pulmonary venous hypertension - PA peak pressure 74 mm Hg on Echo 02/02/16. Not measured on repeat echo 02/08/16 5. Mild CAD by cath 01/2016 - Medical management. Continue atorvastatin 40 mg daily - No CP. 6. Chronic respiratory failure with hypoxia - Continue home 02. He is only every using 2L . Recommend pulmonary follow up with increased 02 demand.  7. HTN - Pressures have been stable at recent visits and at home. Will not increase meds  for now with ongoing fatigue.   Shirley Friar, PA-C  05/23/16

## 2016-05-23 NOTE — Patient Instructions (Signed)
Routine lab work today. Will notify you of abnormal results, otherwise no news is good news!  No changes to medication today.  Follow up 4-6 weeks with Dr. Haroldine Laws.  Do the following things EVERYDAY: 1) Weigh yourself in the morning before breakfast. Write it down and keep it in a log. 2) Take your medicines as prescribed 3) Eat low salt foods-Limit salt (sodium) to 2000 mg per day.  4) Stay as active as you can everyday 5) Limit all fluids for the day to less than 2 liters

## 2016-05-27 ENCOUNTER — Encounter (HOSPITAL_COMMUNITY): Payer: Self-pay

## 2016-05-27 ENCOUNTER — Encounter (HOSPITAL_COMMUNITY)
Admission: RE | Admit: 2016-05-27 | Discharge: 2016-05-27 | Disposition: A | Discharge status: Home / Self Care | Payer: Medicare Other | Source: Ambulatory Visit | Attending: Internal Medicine | Admitting: Internal Medicine

## 2016-05-27 VITALS — BP 130/64 | HR 68 | Ht 71.0 in | Wt 217.8 lb

## 2016-05-27 DIAGNOSIS — I272 Pulmonary hypertension, unspecified: Secondary | ICD-10-CM | POA: Diagnosis not present

## 2016-05-27 DIAGNOSIS — I509 Heart failure, unspecified: Secondary | ICD-10-CM | POA: Diagnosis present

## 2016-05-27 NOTE — Progress Notes (Signed)
[name] 66 y.o. male Pulmonary Rehab Orientation Note Patient arrived today in Cardiac and Pulmonary Rehab for orientation to Pulmonary Rehab. He was transported from [employer] via wheel chair. He does carry portable oxygen. Per pt, he uses oxygen continuously. Color good, skin warm and dry. Patient is oriented to time and place. Patient's medical history, psychosocial health, and medications reviewed. Psychosocial assessment reveals pt lives with their spouse. Pt is currently retired as a Sports coach.  Pt hobbies include fishing. Pt reports his stress level is low. Areas of stress/anxiety include Health. He does not let stress get to him, he does not worry.  Pt does not exhibit signs of depression.  PHQ2/9 score 0/0. Pt shows good  coping skills with positive outlook .  Will continue to monitor and evaluate psychosocial to see if any issues arise. Physical assessment reveals heart rate is normal, breath sounds clear to auscultation, no wheezes, rales, or rhonchi, mild inspiratory wheezing heard RUL. Grip strength equal, strong. Distal pulses 3+ bilateral posterior tibial pulses present. Patient reports he does take medications as prescribed. Patient states he follows a Low Sodium, Diabetic diet. The patient reports no specific efforts to gain or lose weight.. Patient's weight will be monitored closely. Demonstration and practice of PLB using pulse oximeter. Patient able to return demonstration satisfactorily. Safety and hand hygiene in the exercise area reviewed with patient. Patient voices understanding of the information reviewed. Department expectations discussed with patient and achievable goals were set. The patient shows enthusiasm about attending the program and we look forward to working with this nice gentleman. The patient is scheduled for a 6 min walk test on Tuesday, June 04, 2016 @ 3:30 pm and to begin exercise on Tuesday, June 11, 2016  in the 1:30 pm class..   [id number]

## 2016-05-28 ENCOUNTER — Telehealth: Payer: Self-pay

## 2016-05-28 NOTE — Telephone Encounter (Signed)
Tikosyn 250 mcg arrived from [employer] [facility]. Left a voicemail message on patient's home phone. Meds will be placed at front desk.

## 2016-06-04 ENCOUNTER — Encounter (HOSPITAL_COMMUNITY)
Admission: RE | Admit: 2016-06-04 | Discharge: 2016-06-04 | Disposition: A | Discharge status: Home / Self Care | Payer: Medicare Other | Source: Ambulatory Visit | Attending: Internal Medicine | Admitting: Internal Medicine

## 2016-06-04 DIAGNOSIS — I272 Pulmonary hypertension, unspecified: Secondary | ICD-10-CM | POA: Diagnosis not present

## 2016-06-04 DIAGNOSIS — I509 Heart failure, unspecified: Secondary | ICD-10-CM | POA: Diagnosis present

## 2016-06-04 DIAGNOSIS — Z952 Presence of prosthetic heart valve: Secondary | ICD-10-CM

## 2016-06-04 NOTE — Progress Notes (Signed)
Pulmonary Individual Treatment Plan  Patient Details  Name: [name] MRN: [id number] Date of Birth: 04-04-1951 Referring Provider:   April Manson Pulmonary Rehab Walk Test from 06/04/2016 in [place]  Referring Provider  Dr. Haroldine Laws      Initial Encounter Date:  Flowsheet Row Pulmonary Rehab Walk Test from 06/04/2016 in [place]  Date  06/04/16  Referring Provider  Dr. Haroldine Laws      Visit Diagnosis: Acute on chronic heart failure, unspecified heart failure type ([place])  S/P AVR  Patient's Home Medications on Admission:   Current Outpatient Prescriptions:  .  albuterol (PROVENTIL HFA;VENTOLIN HFA) 108 (90 Base) MCG/ACT inhaler, Inhale 2 puffs into the lungs every 6 (six) hours as needed for wheezing or shortness of breath., Disp: 1 Inhaler, Rfl: 5 .  amLODipine (NORVASC) 10 MG tablet, Take 0.5 tablets (5 mg total) by mouth daily., Disp: 30 tablet, Rfl: 11 .  apixaban (ELIQUIS) 5 MG TABS tablet, Take 1 tablet (5 mg total) by mouth 2 (two) times daily., Disp: 180 tablet, Rfl: 3 .  atorvastatin (LIPITOR) 40 MG tablet, Take 1 tablet (40 mg total) by mouth daily at 6 PM., Disp: 90 tablet, Rfl: 3 .  budesonide-formoterol (SYMBICORT) 160-4.5 MCG/ACT inhaler, Inhale 2 puffs into the lungs 2 (two) times daily., Disp: 1 Inhaler, Rfl: 6 .  Cholecalciferol (VITAMIN D) 2000 UNITS CAPS, Take 2,000 Units by mouth daily., Disp: , Rfl:  .  dofetilide (TIKOSYN) 250 MCG capsule, Take 1 capsule (250 mcg total) by mouth 2 (two) times daily., Disp: 60 capsule, Rfl: 3 .  fexofenadine (ALLEGRA) 180 MG tablet, Take 1 tablet (180 mg total) by mouth daily., Disp: 30 tablet, Rfl: 1 .  fluticasone (FLONASE) 50 MCG/ACT nasal spray, Place into both nostrils 2 (two) times daily as needed for allergies or rhinitis., Disp: , Rfl:  .  gabapentin (NEURONTIN) 300 MG capsule, Take 900 mg by mouth at bedtime. , Disp: , Rfl: 3 .  guaiFENesin (MUCINEX)  600 MG 12 hr tablet, Take 600 mg by mouth 2 (two) times daily., Disp: , Rfl:  .  levalbuterol (XOPENEX) 0.63 MG/3ML nebulizer solution, Take 3 mLs (0.63 mg total) by nebulization every 6 (six) hours as needed for wheezing or shortness of breath., Disp: 360 mL, Rfl: 1 .  levothyroxine (SYNTHROID, LEVOTHROID) 175 MCG tablet, Take 175 mcg by mouth daily before breakfast., Disp: , Rfl:  .  metFORMIN (GLUMETZA) 500 MG (MOD) 24 hr tablet, Take 500 mg by mouth daily with breakfast., Disp: , Rfl:  .  metoprolol (LOPRESSOR) 50 MG tablet, Take 1 tablet (50 mg total) by mouth 2 (two) times daily., Disp: 60 tablet, Rfl: 11 .  pantoprazole (PROTONIX) 40 MG tablet, Take 40 mg by mouth daily., Disp: , Rfl:  .  potassium chloride (K-DUR) 10 MEQ tablet, Take 2 tablets (20 mEq total) by mouth 2 (two) times daily., Disp: 360 tablet, Rfl: 3 .  promethazine (PHENERGAN) 25 MG tablet, Take 25 mg by mouth every 6 (six) hours as needed for nausea or vomiting., Disp: , Rfl:  .  rOPINIRole (REQUIP) 1 MG tablet, Take 1 tablet (1 mg total) by mouth daily., Disp: 90 tablet, Rfl: 2 .  spironolactone (ALDACTONE) 25 MG tablet, Take 1 tablet (25 mg total) by mouth daily., Disp: 90 tablet, Rfl: 3 .  torsemide (DEMADEX) 20 MG tablet, Take 2 tablets (40 mg total) by mouth daily., Disp: 60 tablet, Rfl: 6 .  traMADol (ULTRAM)  50 MG tablet, Take 50 mg by mouth daily. Reported on 10/17/2015, Disp: , Rfl:  .  traZODone (DESYREL) 50 MG tablet, Take 25 mg by mouth at bedtime., Disp: , Rfl:  .  valACYclovir (VALTREX) 1000 MG tablet, Take 1 tablet by mouth every 12 (twelve) hours as needed (fever blisters). Reported on 10/17/2015, Disp: , Rfl:   Past Medical History: Past Medical History:  Diagnosis Date  . Aortic stenosis, mild   . Arthritis of hand    "just a little bit in both hands" (03/31/2012)  . Asthma    "little bit" (03/31/2012)  . Atrial fibrillation [facility])    dx '04; DCCV '04, placed on flecainide, failed DCCV 04/2010, flecainide  stopped; s/p successful A.fib ablation 01/31/12  . CHF (congestive heart failure) ([place])   . Diabetes mellitus without complication (HCC)    borderline  . DJD (degenerative joint disease)   . Exertional dyspnea   . Fibromyalgia   . 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 GERD (gastroesophageal reflux disease)   . Heart murmur   . Hyperlipidemia   . Hypertension   . Hypothyroidism    S/P radiation  . Left atrial enlargement    LA size 13mm by echo 11/21/11  . Mitral regurgitation    trivial  . Obesity   . Obstructive sleep apnea    mild by sleep study 2013; pt stated he does not have a machine because "it wasn't bad enough for him to have one"  . Pacemaker 03/31/2012  . Restless leg syndrome   . Second degree AV block   . Visit for monitoring Tikosyn therapy 04/16/2016    Tobacco Use: History  Smoking Status  . Former Smoker  . Packs/day: 0.50  . Years: 15.00  . Types: Cigarettes  . Quit date: 07/18/1991  Smokeless Tobacco  . Never Used    Labs: Recent Review Flowsheet Data    Labs for ITP Cardiac and Pulmonary Rehab Latest Ref Rng & Units 04/17/2016 04/17/2016 04/17/2016 04/17/2016 04/17/2016   Cholestrol 125 - 200 mg/dL - - - - -   LDLCALC <130 mg/dL - - - - -   HDL >=40 mg/dL - - - - -   Trlycerides <150 mg/dL - - - - -   Hemoglobin A1c 4.8 - 5.6 % - - - - -   PHART 7.350 - 7.450 7.442 - - - 7.308(L)   PCO2ART 32.0 - 48.0 mmHg 44.1 - - - 38.5   HCO3 20.0 - 28.0 mmol/L 30.1(H) 28.1(H) 28.0 28.6(H) 19.3(L)   TCO2 0 - 100 mmol/L 31 29 29 30 20    ACIDBASEDEF 0.0 - 2.0 mmol/L - - - - 7.0(H)   O2SAT % 43.0 43.0 42.0 50.0 87.0      Capillary Blood Glucose: Lab Results  Component Value Date   GLUCAP 88 04/08/2016   GLUCAP 136 (H) 11/24/2015   GLUCAP 118 (H) 11/24/2015   GLUCAP 114 (H) 11/24/2015   GLUCAP 120 (H) 11/23/2015     ADL UCSD:   Pulmonary Function Assessment:     Pulmonary Function Assessment - 05/27/16 1256      Breath   Bilateral  Breath Sounds Clear;Wheezes  clear with one wheeze in right mid lobe   Shortness of Breath Limiting activity;Yes      Exercise Target Goals: Date: 06/04/16  Exercise Program Goal: Individual exercise prescription set with THRR, safety & activity barriers. Participant demonstrates ability to understand and report RPE using BORG  scale, to self-measure pulse accurately, and to acknowledge the importance of the exercise prescription.  Exercise Prescription Goal: Starting with aerobic activity 30 plus minutes a day, 3 days per week for initial exercise prescription. Provide home exercise prescription and guidelines that participant acknowledges understanding prior to discharge.  Activity Barriers & Risk Stratification:     Activity Barriers & Cardiac Risk Stratification - 05/27/16 1255      Activity Barriers & Cardiac Risk Stratification   Activity Barriers Back Problems      6 Minute Walk:     6 Minute Walk    Row Name 06/04/16 1633         6 Minute Walk   Phase Initial     Distance 1000 feet     Walk Time -  4 minutes 21 seconds     # of Rest Breaks 3  1 minute and 39 seconds total     MPH 1.89     METS 2.45     RPE 17     Perceived Dyspnea  3     Symptoms Yes (comment)  leg fatigue     Comments used wheelchair     Resting HR 78 bpm     Resting BP 140/62     Max Ex. HR 100 bpm     Max Ex. BP 144/64       Interval HR   Baseline HR 78     1 Minute HR 91     2 Minute HR 99     3 Minute HR 100     4 Minute HR 92     5 Minute HR 98     6 Minute HR 100     2 Minute Post HR 89     Interval Heart Rate? Yes       Interval Oxygen   Interval Oxygen? Yes     Baseline Oxygen Saturation % 100 %     Baseline Liters of Oxygen 2 L     1 Minute Oxygen Saturation % 95 %     1 Minute Liters of Oxygen 2 L     2 Minute Oxygen Saturation % 94 %     2 Minute Liters of Oxygen 2 L     3 Minute Oxygen Saturation % 93 %     3 Minute Liters of Oxygen 2 L     4 Minute Oxygen  Saturation % 91 %     4 Minute Liters of Oxygen 2 L     5 Minute Oxygen Saturation % 91 %     5 Minute Liters of Oxygen 2 L     6 Minute Oxygen Saturation % 92 %     6 Minute Liters of Oxygen 2 L     2 Minute Post Oxygen Saturation % 92 %     2 Minute Post Liters of Oxygen 2 L        Initial Exercise Prescription:     Initial Exercise Prescription - 06/04/16 1600      Date of Initial Exercise RX and Referring Provider   Date 06/04/16   Referring Provider Dr. Haroldine Laws     Oxygen   Oxygen Continuous   Liters 2     Bike   Level 0.5   Minutes 17     NuStep   Level 2   Minutes 17   METs 1.5     Track   Laps 5   Minutes 17  Prescription Details   Frequency (times per week) 2   Duration Progress to 45 minutes of aerobic exercise without signs/symptoms of physical distress     Intensity   THRR 40-80% of Max Heartrate 61-122   Ratings of Perceived Exertion 11-13   Perceived Dyspnea 0-4     Progression   Progression Continue progressive overload as per policy without signs/symptoms or physical distress.     Resistance Training   Training Prescription Yes   Weight blue bands   Reps 10-12      Perform Capillary Blood Glucose checks as needed.  Exercise Prescription Changes:   Exercise Comments:   Discharge Exercise Prescription (Final Exercise Prescription Changes):    Nutrition:  Target Goals: Understanding of nutrition guidelines, daily intake of sodium 1500mg , cholesterol 200mg , calories 30% from fat and 7% or less from saturated fats, daily to have 5 or more servings of fruits and vegetables.  Biometrics:    Nutrition Therapy Plan and Nutrition Goals:   Nutrition Discharge: Rate Your Plate Scores:   Psychosocial: Target Goals: Acknowledge presence or absence of depression, maximize coping skills, provide positive support system. Participant is able to verbalize types and ability to use techniques and skills needed for reducing stress and  depression.  Initial Review & Psychosocial Screening:     Initial Psych Review & Screening - 05/27/16 [place]? Yes     Barriers   Psychosocial barriers to participate in program There are no identifiable barriers or psychosocial needs.     Screening Interventions   Interventions Encouraged to exercise      Quality of Life Scores:   PHQ-9: Recent Review Flowsheet Data    Depression screen [facility] 2/9 05/27/2016 10/25/2015   Decreased Interest 0 0   Down, Depressed, Hopeless 0 0   PHQ - 2 Score 0 0      Psychosocial Evaluation and Intervention:     Psychosocial Evaluation - 05/27/16 1304      Psychosocial Evaluation & Interventions   Interventions Encouraged to exercise with the program and follow exercise prescription   Continued Psychosocial Services Needed No      Psychosocial Re-Evaluation:  Education: Education Goals: Education classes will be provided on a weekly basis, covering required topics. Participant will state understanding/return demonstration of topics presented.  Learning Barriers/Preferences:     Learning Barriers/Preferences - 05/27/16 1256      Learning Barriers/Preferences   Learning Barriers None   Learning Preferences Written Material;Skilled Demonstration      Education Topics: Risk Factor Reduction:  -Group instruction that is supported by a PowerPoint presentation. Instructor discusses the definition of a risk factor, different risk factors for pulmonary disease, and how the heart and lungs work together.     Nutrition for Pulmonary Patient:  -Group instruction provided by PowerPoint slides, verbal discussion, and written materials to support subject matter. The instructor gives an explanation and review of healthy diet recommendations, which includes a discussion on weight management, recommendations for fruit and vegetable consumption, as well as protein, fluid, caffeine, fiber, sodium, sugar,  and alcohol. Tips for eating when patients are short of breath are discussed.   Pursed Lip Breathing:  -Group instruction that is supported by demonstration and informational handouts. Instructor discusses the benefits of pursed lip and diaphragmatic breathing and detailed demonstration on how to preform both.     Oxygen Safety:  -Group instruction provided by PowerPoint, verbal discussion, and written material to support subject  matter. There is an overview of "What is Oxygen" and "Why do we need it".  Instructor also reviews how to create a safe environment for oxygen use, the importance of using oxygen as prescribed, and the risks of noncompliance. There is a brief discussion on traveling with oxygen and resources the patient may utilize.   Oxygen Equipment:  -Group instruction provided by [facility] Staff utilizing handouts, written materials, and equipment demonstrations.   Signs and Symptoms:  -Group instruction provided by written material and verbal discussion to support subject matter. Warning signs and symptoms of infection, stroke, and heart attack are reviewed and when to call the physician/911 reinforced. Tips for preventing the spread of infection discussed.   Advanced Directives:  -Group instruction provided by verbal instruction and written material to support subject matter. Instructor reviews Advanced Directive laws and proper instruction for filling out document.   Pulmonary Video:  -Group video education that reviews the importance of medication and oxygen compliance, exercise, good nutrition, pulmonary hygiene, and pursed lip and diaphragmatic breathing for the pulmonary patient.   Exercise for the Pulmonary Patient:  -Group instruction that is supported by a PowerPoint presentation. Instructor discusses benefits of exercise, core components of exercise, frequency, duration, and intensity of an exercise routine, importance of utilizing pulse oximetry during exercise,  safety while exercising, and options of places to exercise outside of rehab.     Pulmonary Medications:  -Verbally interactive group education provided by instructor with focus on inhaled medications and proper administration.   Anatomy and Physiology of the Respiratory System and Intimacy:  -Group instruction provided by PowerPoint, verbal discussion, and written material to support subject matter. Instructor reviews respiratory cycle and anatomical components of the respiratory system and their functions. Instructor also reviews differences in obstructive and restrictive respiratory diseases with examples of each. Intimacy, Sex, and Sexuality differences are reviewed with a discussion on how relationships can change when diagnosed with pulmonary disease. Common sexual concerns are reviewed.   Knowledge Questionnaire Score:   Core Components/Risk Factors/Patient Goals at Admission:     Personal Goals and Risk Factors at Admission - 05/27/16 1300      Core Components/Risk Factors/Patient Goals on Admission   Increase Strength and Stamina Yes   Intervention Provide advice, education, support and counseling about physical activity/exercise needs.;Develop an individualized exercise prescription for aerobic and resistive training based on initial evaluation findings, risk stratification, comorbidities and participant's personal goals.   Expected Outcomes Achievement of increased cardiorespiratory fitness and enhanced flexibility, muscular endurance and strength shown through measurements of functional capacity and personal statement of participant.   Improve shortness of breath with ADL's Yes   Intervention Provide education, individualized exercise plan and daily activity instruction to help decrease symptoms of SOB with activities of daily living.   Expected Outcomes Short Term: Achieves a reduction of symptoms when performing activities of daily living.   Develop more efficient breathing  techniques such as purse lipped breathing and diaphragmatic breathing; and practicing self-pacing with activity Yes   Intervention Provide education, demonstration and support about specific breathing techniuqes utilized for more efficient breathing. Include techniques such as pursed lipped breathing, diaphragmatic breathing and self-pacing activity.   Expected Outcomes Short Term: Participant will be able to demonstrate and use breathing techniques as needed throughout daily activities.   Increase knowledge of respiratory medications and ability to use respiratory devices properly  Yes   Intervention Provide education and demonstration as needed of appropriate use of medications, inhalers, and oxygen therapy.  Expected Outcomes Short Term: Achieves understanding of medications use. Understands that oxygen is a medication prescribed by physician. Demonstrates appropriate use of inhaler and oxygen therapy.      Core Components/Risk Factors/Patient Goals Review:      Goals and Risk Factor Review    Row Name 05/27/16 1303             Core Components/Risk Factors/Patient Goals Review   Personal Goals Review Increase Strength and Stamina;Improve shortness of breath with ADL's;Develop more efficient breathing techniques such as purse lipped breathing and diaphragmatic breathing and practicing self-pacing with activity.          Core Components/Risk Factors/Patient Goals at Discharge (Final Review):      Goals and Risk Factor Review - 05/27/16 1303      Core Components/Risk Factors/Patient Goals Review   Personal Goals Review Increase Strength and Stamina;Improve shortness of breath with ADL's;Develop more efficient breathing techniques such as purse lipped breathing and diaphragmatic breathing and practicing self-pacing with activity.      ITP Comments:   Comments:

## 2016-06-07 ENCOUNTER — Telehealth (HOSPITAL_COMMUNITY): Payer: Self-pay | Admitting: Pharmacist

## 2016-06-07 NOTE — Telephone Encounter (Signed)
[employer] patient assistance states that the Tikosyn 250 mcg BID is off of back order and it was sent to [facility] office on 05/22/16.   Ruta Hinds. Velva Harman, PharmD, BCPS, CPP Clinical Pharmacist Pager: [phone] Phone: [phone] 06/07/2016 2:52 PM

## 2016-06-11 ENCOUNTER — Encounter (HOSPITAL_COMMUNITY)
Admission: RE | Admit: 2016-06-11 | Discharge: 2016-06-11 | Disposition: A | Discharge status: Home / Self Care | Payer: Medicare Other | Source: Ambulatory Visit | Attending: Internal Medicine | Admitting: Internal Medicine

## 2016-06-11 ENCOUNTER — Telehealth (HOSPITAL_COMMUNITY): Payer: Self-pay | Admitting: *Deleted

## 2016-06-11 VITALS — Wt 219.6 lb

## 2016-06-11 DIAGNOSIS — I509 Heart failure, unspecified: Secondary | ICD-10-CM

## 2016-06-11 NOTE — Telephone Encounter (Signed)
Pt's wife called to let us know that his primary doctor would be faxing over lab results that were taken in their office at [facility] center today.  I explained to her that once we receive them and the MD reviews we would call them directly with any medication changes.  She understands and no further questions at this time.

## 2016-06-11 NOTE — Progress Notes (Signed)
Daily Session Note  Patient Details  Name: [name] MRN: [id number] Date of Birth: [birth year] Referring Provider:   April Manson Pulmonary Rehab Walk Test from 06/04/2016 in [place]  Referring Provider  Dr. Haroldine Laws      Encounter Date: 06/11/2016  Check In:     Session Check In - 06/11/16 1513      Check-In   Location [facility]   Staff Present Rodney Langton, RN;Molly diVincenzo, MS, ACSM RCEP, Exercise Physiologist   Supervising physician immediately available to respond to emergencies [facility] immediately available   Physician(s) Dr. Posey Pronto   Medication changes reported     No   Fall or balance concerns reported    No   Warm-up and Cool-down Performed as group-led instruction   Resistance Training Performed Yes   VAD Patient? No     Pain Assessment   Currently in Pain? No/denies      Capillary Blood Glucose: No results found for this or any previous visit (from the past 24 hour(s)).     POCT Glucose - 06/11/16 1518      POCT Blood Glucose   Pre-Exercise 140 mg/dL   Post-Exercise 138 mg/dL         Exercise Prescription Changes - 06/11/16 1500      Response to Exercise   Blood Pressure (Admit) 130/70   Blood Pressure (Exercise) 152/74   Blood Pressure (Exit) 100/60   Heart Rate (Admit) 77 bpm   Heart Rate (Exercise) 93 bpm   Heart Rate (Exit) 72 bpm   Oxygen Saturation (Admit) 97 %   Oxygen Saturation (Exercise) 91 %   Oxygen Saturation (Exit) 98 %   Rating of Perceived Exertion (Exercise) 15   Perceived Dyspnea (Exercise) 3   Duration Progress to 45 minutes of aerobic exercise without signs/symptoms of physical distress   Intensity Other (comment)  40-80%of HRR     Progression   Progression Continue to progress workloads to maintain intensity without signs/symptoms of physical distress.     Resistance Training   Training Prescription Yes   Weight blue bands   Reps 10-12  10  minutes of strength training     Interval Training   Interval Training Yes     Oxygen   Oxygen Continuous   Liters 2     Bike   Level 0.5   Minutes 17     NuStep   Level 2   Minutes 17   METs 1.7     Track   Laps 10   Minutes 17     Goals Met:  No report of cardiac concerns or symptoms Strength training completed today  Goals Unmet:  Not Applicable  Comments: Service time is from 130 to 1500    Dr. Rush Farmer is Medical Director for Pulmonary Rehab at [facility].

## 2016-06-13 ENCOUNTER — Encounter (HOSPITAL_COMMUNITY)
Admission: RE | Admit: 2016-06-13 | Discharge: 2016-06-13 | Disposition: A | Discharge status: Home / Self Care | Payer: Medicare Other | Source: Ambulatory Visit | Attending: Internal Medicine | Admitting: Internal Medicine

## 2016-06-13 ENCOUNTER — Telehealth (HOSPITAL_COMMUNITY): Payer: Self-pay | Admitting: Internal Medicine

## 2016-06-13 DIAGNOSIS — I509 Heart failure, unspecified: Secondary | ICD-10-CM

## 2016-06-13 DIAGNOSIS — Z952 Presence of prosthetic heart valve: Secondary | ICD-10-CM

## 2016-06-13 NOTE — Progress Notes (Signed)
Pulmonary Individual Treatment Plan  Patient Details  Name: [name] MRN: [id number] Date of Birth: 04-04-1951 Referring Provider:   April Manson Pulmonary Rehab Walk Test from 06/04/2016 in [place]  Referring Provider  Dr. Haroldine Laws      Initial Encounter Date:  Flowsheet Row Pulmonary Rehab Walk Test from 06/04/2016 in [place]  Date  06/04/16  Referring Provider  Dr. Haroldine Laws      Visit Diagnosis: Acute on chronic heart failure, unspecified heart failure type ([place])  S/P AVR  Patient's Home Medications on Admission:   Current Outpatient Prescriptions:  .  albuterol (PROVENTIL HFA;VENTOLIN HFA) 108 (90 Base) MCG/ACT inhaler, Inhale 2 puffs into the lungs every 6 (six) hours as needed for wheezing or shortness of breath., Disp: 1 Inhaler, Rfl: 5 .  amLODipine (NORVASC) 10 MG tablet, Take 0.5 tablets (5 mg total) by mouth daily., Disp: 30 tablet, Rfl: 11 .  apixaban (ELIQUIS) 5 MG TABS tablet, Take 1 tablet (5 mg total) by mouth 2 (two) times daily., Disp: 180 tablet, Rfl: 3 .  atorvastatin (LIPITOR) 40 MG tablet, Take 1 tablet (40 mg total) by mouth daily at 6 PM., Disp: 90 tablet, Rfl: 3 .  budesonide-formoterol (SYMBICORT) 160-4.5 MCG/ACT inhaler, Inhale 2 puffs into the lungs 2 (two) times daily., Disp: 1 Inhaler, Rfl: 6 .  Cholecalciferol (VITAMIN D) 2000 UNITS CAPS, Take 2,000 Units by mouth daily., Disp: , Rfl:  .  dofetilide (TIKOSYN) 250 MCG capsule, Take 1 capsule (250 mcg total) by mouth 2 (two) times daily., Disp: 60 capsule, Rfl: 3 .  fexofenadine (ALLEGRA) 180 MG tablet, Take 1 tablet (180 mg total) by mouth daily., Disp: 30 tablet, Rfl: 1 .  fluticasone (FLONASE) 50 MCG/ACT nasal spray, Place into both nostrils 2 (two) times daily as needed for allergies or rhinitis., Disp: , Rfl:  .  gabapentin (NEURONTIN) 300 MG capsule, Take 900 mg by mouth at bedtime. , Disp: , Rfl: 3 .  guaiFENesin (MUCINEX)  600 MG 12 hr tablet, Take 600 mg by mouth 2 (two) times daily., Disp: , Rfl:  .  levalbuterol (XOPENEX) 0.63 MG/3ML nebulizer solution, Take 3 mLs (0.63 mg total) by nebulization every 6 (six) hours as needed for wheezing or shortness of breath., Disp: 360 mL, Rfl: 1 .  levothyroxine (SYNTHROID, LEVOTHROID) 175 MCG tablet, Take 175 mcg by mouth daily before breakfast., Disp: , Rfl:  .  metFORMIN (GLUMETZA) 500 MG (MOD) 24 hr tablet, Take 500 mg by mouth daily with breakfast., Disp: , Rfl:  .  metoprolol (LOPRESSOR) 50 MG tablet, Take 1 tablet (50 mg total) by mouth 2 (two) times daily., Disp: 60 tablet, Rfl: 11 .  pantoprazole (PROTONIX) 40 MG tablet, Take 40 mg by mouth daily., Disp: , Rfl:  .  potassium chloride (K-DUR) 10 MEQ tablet, Take 2 tablets (20 mEq total) by mouth 2 (two) times daily., Disp: 360 tablet, Rfl: 3 .  promethazine (PHENERGAN) 25 MG tablet, Take 25 mg by mouth every 6 (six) hours as needed for nausea or vomiting., Disp: , Rfl:  .  rOPINIRole (REQUIP) 1 MG tablet, Take 1 tablet (1 mg total) by mouth daily., Disp: 90 tablet, Rfl: 2 .  spironolactone (ALDACTONE) 25 MG tablet, Take 1 tablet (25 mg total) by mouth daily., Disp: 90 tablet, Rfl: 3 .  torsemide (DEMADEX) 20 MG tablet, Take 2 tablets (40 mg total) by mouth daily., Disp: 60 tablet, Rfl: 6 .  traMADol (ULTRAM)  50 MG tablet, Take 50 mg by mouth daily. Reported on 10/17/2015, Disp: , Rfl:  .  traZODone (DESYREL) 50 MG tablet, Take 25 mg by mouth at bedtime., Disp: , Rfl:  .  valACYclovir (VALTREX) 1000 MG tablet, Take 1 tablet by mouth every 12 (twelve) hours as needed (fever blisters). Reported on 10/17/2015, Disp: , Rfl:   Past Medical History: Past Medical History:  Diagnosis Date  . Aortic stenosis, mild   . Arthritis of hand    "just a little bit in both hands" (03/31/2012)  . Asthma    "little bit" (03/31/2012)  . Atrial fibrillation [facility])    dx '04; DCCV '04, placed on flecainide, failed DCCV 04/2010, flecainide  stopped; s/p successful A.fib ablation 01/31/12  . CHF (congestive heart failure) ([place])   . Diabetes mellitus without complication (HCC)    borderline  . DJD (degenerative joint disease)   . Exertional dyspnea   . Fibromyalgia   . 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 GERD (gastroesophageal reflux disease)   . Heart murmur   . Hyperlipidemia   . Hypertension   . Hypothyroidism    S/P radiation  . Left atrial enlargement    LA size 480mby echo 11/21/11  . Mitral regurgitation    trivial  . Obesity   . Obstructive sleep apnea    mild by sleep study 2013; pt stated he does not have a machine because "it wasn't bad enough for him to have one"  . Pacemaker 03/31/2012  . Restless leg syndrome   . Second degree AV block   . Visit for monitoring Tikosyn therapy 04/16/2016    Tobacco Use: History  Smoking Status  . Former Smoker  . Packs/day: 0.50  . Years: 15.00  . Types: Cigarettes  . Quit date: 07/18/1991  Smokeless Tobacco  . Never Used    Labs: Recent Review Flowsheet Data    Labs for ITP Cardiac and Pulmonary Rehab Latest Ref Rng & Units 04/17/2016 04/17/2016 04/17/2016 04/17/2016 04/17/2016   Cholestrol 125 - 200 mg/dL - - - - -   LDLCALC <130 mg/dL - - - - -   HDL >=40 mg/dL - - - - -   Trlycerides <150 mg/dL - - - - -   Hemoglobin A1c 4.8 - 5.6 % - - - - -   PHART 7.350 - 7.450 7.442 - - - 7.308(L)   PCO2ART 32.0 - 48.0 mmHg 44.1 - - - 38.5   HCO3 20.0 - 28.0 mmol/L 30.1(H) 28.1(H) 28.0 28.6(H) 19.3(L)   TCO2 0 - 100 mmol/L _0 ACIDBASEDEF 0.0 - 2.0 mmol/L - - - - 7.0(H)   O2SAT % 43.0 43.0 42.0 50.0 87.0      Capillary Blood Glucose: Lab Results  Component Value Date   GLUCAP 88 04/08/2016   GLUCAP 136 (H) 11/24/2015   GLUCAP 118 (H) 11/24/2015   GLUCAP 114 (H) 11/24/2015   GLUCAP 120 (H) 11/23/2015       POCT Glucose    Row Name 06/11/16 1518             POCT Blood Glucose   Pre-Exercise 140 mg/dL       Post-Exercise  138 mg/dL          ADL UCSD:   Pulmonary Function Assessment:     Pulmonary Function Assessment - 05/27/16 1256      Breath   Bilateral Breath Sounds Clear;Wheezes  clear with one wheeze in right mid lobe   Shortness of Breath Limiting activity;Yes      Exercise Target Goals:    Exercise Program Goal: Individual exercise prescription set with THRR, safety & activity barriers. Participant demonstrates ability to understand and report RPE using BORG scale, to self-measure pulse accurately, and to acknowledge the importance of the exercise prescription.  Exercise Prescription Goal: Starting with aerobic activity 30 plus minutes a day, 3 days per week for initial exercise prescription. Provide home exercise prescription and guidelines that participant acknowledges understanding prior to discharge.  Activity Barriers & Risk Stratification:     Activity Barriers & Cardiac Risk Stratification - 05/27/16 1255      Activity Barriers & Cardiac Risk Stratification   Activity Barriers Back Problems      6 Minute Walk:     6 Minute Walk    Row Name 06/04/16 1633         6 Minute Walk   Phase Initial     Distance 1000 feet     Walk Time -  4 minutes 21 seconds     # of Rest Breaks 3  1 minute and 39 seconds total     MPH 1.89     METS 2.45     RPE 17     Perceived Dyspnea  3     Symptoms Yes (comment)  leg fatigue     Comments used wheelchair     Resting HR 78 bpm     Resting BP 140/62     Max Ex. HR 100 bpm     Max Ex. BP 144/64       Interval HR   Baseline HR 78     1 Minute HR 91     2 Minute HR 99     3 Minute HR 100     4 Minute HR 92     5 Minute HR 98     6 Minute HR 100     2 Minute Post HR 89     Interval Heart Rate? Yes       Interval Oxygen   Interval Oxygen? Yes     Baseline Oxygen Saturation % 100 %     Baseline Liters of Oxygen 2 L     1 Minute Oxygen Saturation % 95 %     1 Minute Liters of Oxygen 2 L     2 Minute Oxygen Saturation %  94 %     2 Minute Liters of Oxygen 2 L     3 Minute Oxygen Saturation % 93 %     3 Minute Liters of Oxygen 2 L     4 Minute Oxygen Saturation % 91 %     4 Minute Liters of Oxygen 2 L     5 Minute Oxygen Saturation % 91 %     5 Minute Liters of Oxygen 2 L     6 Minute Oxygen Saturation % 92 %     6 Minute Liters of Oxygen 2 L     2 Minute Post Oxygen Saturation % 92 %     2 Minute Post Liters of Oxygen 2 L        Initial Exercise Prescription:     Initial Exercise Prescription - 06/04/16 1600      Date of Initial Exercise RX and Referring Provider   Date 06/04/16   Referring Provider Dr. Haroldine Laws     Oxygen   Oxygen Continuous  Liters 2     Bike   Level 0.5   Minutes 17     NuStep   Level 2   Minutes 17   METs 1.5     Track   Laps 5   Minutes 17     Prescription Details   Frequency (times per week) 2   Duration Progress to 45 minutes of aerobic exercise without signs/symptoms of physical distress     Intensity   THRR 40-80% of Max Heartrate 61-122   Ratings of Perceived Exertion 11-13   Perceived Dyspnea 0-4     Progression   Progression Continue progressive overload as per policy without signs/symptoms or physical distress.     Resistance Training   Training Prescription Yes   Weight blue bands   Reps 10-12      Perform Capillary Blood Glucose checks as needed.  Exercise Prescription Changes:     Exercise Prescription Changes    Row Name 06/11/16 1500             Response to Exercise   Blood Pressure (Admit) 130/70       Blood Pressure (Exercise) 152/74       Blood Pressure (Exit) 100/60       Heart Rate (Admit) 77 bpm       Heart Rate (Exercise) 93 bpm       Heart Rate (Exit) 72 bpm       Oxygen Saturation (Admit) 97 %       Oxygen Saturation (Exercise) 91 %       Oxygen Saturation (Exit) 98 %       Rating of Perceived Exertion (Exercise) 15       Perceived Dyspnea (Exercise) 3       Duration Progress to 45 minutes of aerobic  exercise without signs/symptoms of physical distress       Intensity Other (comment)  40-80%of HRR         Progression   Progression Continue to progress workloads to maintain intensity without signs/symptoms of physical distress.         Resistance Training   Training Prescription Yes       Weight blue bands       Reps 10-12  10 minutes of strength training         Interval Training   Interval Training Yes         Oxygen   Oxygen Continuous       Liters 2         Bike   Level 0.5       Minutes 17         NuStep   Level 2       Minutes 17       METs 1.7         Track   Laps 10       Minutes 17          Exercise Comments:   Discharge Exercise Prescription (Final Exercise Prescription Changes):     Exercise Prescription Changes - 06/11/16 1500      Response to Exercise   Blood Pressure (Admit) 130/70   Blood Pressure (Exercise) 152/74   Blood Pressure (Exit) 100/60   Heart Rate (Admit) 77 bpm   Heart Rate (Exercise) 93 bpm   Heart Rate (Exit) 72 bpm   Oxygen Saturation (Admit) 97 %   Oxygen Saturation (Exercise) 91 %   Oxygen Saturation (Exit) 98 %   Rating of Perceived  Exertion (Exercise) 15   Perceived Dyspnea (Exercise) 3   Duration Progress to 45 minutes of aerobic exercise without signs/symptoms of physical distress   Intensity Other (comment)  40-80%of HRR     Progression   Progression Continue to progress workloads to maintain intensity without signs/symptoms of physical distress.     Resistance Training   Training Prescription Yes   Weight blue bands   Reps 10-12  10 minutes of strength training     Interval Training   Interval Training Yes     Oxygen   Oxygen Continuous   Liters 2     Bike   Level 0.5   Minutes 17     NuStep   Level 2   Minutes 17   METs 1.7     Track   Laps 10   Minutes 17       Nutrition:  Target Goals: Understanding of nutrition guidelines, daily intake of sodium <1552m, cholesterol <2062m  calories 30% from fat and 7% or less from saturated fats, daily to have 5 or more servings of fruits and vegetables.  Biometrics:    Nutrition Therapy Plan and Nutrition Goals:   Nutrition Discharge: Rate Your Plate Scores:   Psychosocial: Target Goals: Acknowledge presence or absence of depression, maximize coping skills, provide positive support system. Participant is able to verbalize types and ability to use techniques and skills needed for reducing stress and depression.  Initial Review & Psychosocial Screening:     Initial Psych Review & Screening - 05/27/16 [place]     Barriers   Psychosocial barriers to participate in program There are no identifiable barriers or psychosocial needs.     Screening Interventions   Interventions Encouraged to exercise      Quality of Life Scores:   PHQ-9: Recent Review Flowsheet Data    [facility] 05/27/2016 10/25/2015   Decreased Interest 0 0   Down, Depressed, Hopeless 0 0   PHQ - 2 Score 0 0      Psychosocial Evaluation and Intervention:     Psychosocial Evaluation - 05/27/16 1304      Psychosocial Evaluation & Interventions   Interventions Encouraged to exercise with the program and follow exercise prescription   Continued Psychosocial Services Needed No      Psychosocial Re-Evaluation:     Psychosocial Re-Evaluation    [place] 06/11[place]16 1554             Psychosocial Re-Evaluation   Interventions Encouraged to attend Pulmonary Rehabilitation for the exercise       Comments no psychosocial issues identified at this time       Continued Psychosocial Services Needed No         Education: Education Goals: Education classes will be provided on a weekly basis, covering required topics. Participant will state understanding/return demonstration of topics presented.  Learning Barriers/Preferences:     Learning Barriers/Preferences - 05/27/16 1256       Learning Barriers/Preferences   Learning Barriers None   Learning Preferences Written Material;Skilled Demonstration      Education Topics: Risk Factor Reduction:  -Group instruction that is supported by a PowerPoint presentation. Instructor discusses the definition of a risk factor, different risk factors for pulmonary disease, and how the heart and lungs work together.     Nutrition for Pulmonary Patient:  -Group instruction provided by PowerPoint slides, verbal discussion, and written materials to support subject  matter. The instructor gives an explanation and review of healthy diet recommendations, which includes a discussion on weight management, recommendations for fruit and vegetable consumption, as well as protein, fluid, caffeine, fiber, sodium, sugar, and alcohol. Tips for eating when patients are short of breath are discussed.   Pursed Lip Breathing:  -Group instruction that is supported by demonstration and informational handouts. Instructor discusses the benefits of pursed lip and diaphragmatic breathing and detailed demonstration on how to preform both.     Oxygen Safety:  -Group instruction provided by PowerPoint, verbal discussion, and written material to support subject matter. There is an overview of "What is Oxygen" and "Why do we need it".  Instructor also reviews how to create a safe environment for oxygen use, the importance of using oxygen as prescribed, and the risks of noncompliance. There is a brief discussion on traveling with oxygen and resources the patient may utilize.   Oxygen Equipment:  -Group instruction provided by [facility] Staff utilizing handouts, written materials, and equipment demonstrations.   Signs and Symptoms:  -Group instruction provided by written material and verbal discussion to support subject matter. Warning signs and symptoms of infection, stroke, and heart attack are reviewed and when to call the physician/911 reinforced. Tips for  preventing the spread of infection discussed.   Advanced Directives:  -Group instruction provided by verbal instruction and written material to support subject matter. Instructor reviews Advanced Directive laws and proper instruction for filling out document.   Pulmonary Video:  -Group video education that reviews the importance of medication and oxygen compliance, exercise, good nutrition, pulmonary hygiene, and pursed lip and diaphragmatic breathing for the pulmonary patient.   Exercise for the Pulmonary Patient:  -Group instruction that is supported by a PowerPoint presentation. Instructor discusses benefits of exercise, core components of exercise, frequency, duration, and intensity of an exercise routine, importance of utilizing pulse oximetry during exercise, safety while exercising, and options of places to exercise outside of rehab.     Pulmonary Medications:  -Verbally interactive group education provided by instructor with focus on inhaled medications and proper administration.   Anatomy and Physiology of the Respiratory System and Intimacy:  -Group instruction provided by PowerPoint, verbal discussion, and written material to support subject matter. Instructor reviews respiratory cycle and anatomical components of the respiratory system and their functions. Instructor also reviews differences in obstructive and restrictive respiratory diseases with examples of each. Intimacy, Sex, and Sexuality differences are reviewed with a discussion on how relationships can change when diagnosed with pulmonary disease. Common sexual concerns are reviewed.   Knowledge Questionnaire Score:   Core Components/Risk Factors/Patient Goals at Admission:     Personal Goals and Risk Factors at Admission - 05/27/16 1300      Core Components/Risk Factors/Patient Goals on Admission   Increase Strength and Stamina Yes   Intervention Provide advice, education, support and counseling about physical  activity/exercise needs.;Develop an individualized exercise prescription for aerobic and resistive training based on initial evaluation findings, risk stratification, comorbidities and participant's personal goals.   Expected Outcomes Achievement of increased cardiorespiratory fitness and enhanced flexibility, muscular endurance and strength shown through measurements of functional capacity and personal statement of participant.   Improve shortness of breath with ADL's Yes   Intervention Provide education, individualized exercise plan and daily activity instruction to help decrease symptoms of SOB with activities of daily living.   Expected Outcomes Short Term: Achieves a reduction of symptoms when performing activities of daily living.   Develop more efficient breathing  techniques such as purse lipped breathing and diaphragmatic breathing; and practicing self-pacing with activity Yes   Intervention Provide education, demonstration and support about specific breathing techniuqes utilized for more efficient breathing. Include techniques such as pursed lipped breathing, diaphragmatic breathing and self-pacing activity.   Expected Outcomes Short Term: Participant will be able to demonstrate and use breathing techniques as needed throughout daily activities.   Increase knowledge of respiratory medications and ability to use respiratory devices properly  Yes   Intervention Provide education and demonstration as needed of appropriate use of medications, inhalers, and oxygen therapy.   Expected Outcomes Short Term: Achieves understanding of medications use. Understands that oxygen is a medication prescribed by physician. Demonstrates appropriate use of inhaler and oxygen therapy.      Core Components/Risk Factors/Patient Goals Review:      Goals and Risk Factor Review    Row Name 05/27/16 1303 06/11/16 1550 06/13/16 0908 06/13/16 0910       Core Components/Risk Factors/Patient Goals Review   Personal  Goals Review Increase Strength and Stamina;Improve shortness of breath with ADL's;Develop more efficient breathing techniques such as purse lipped breathing and diaphragmatic breathing and practicing self-pacing with activity.  - Weight Management/Obesity;Increase Strength and Stamina;Improve shortness of breath with ADL's;Develop more efficient breathing techniques such as purse lipped breathing and diaphragmatic breathing and practicing self-pacing with activity.  -    Review  - Just started program, has only exercised once, too early to see progress  - Just started program, too early to see progress    Expected Outcomes  - expect progress in the next 30 days  - expect progress in the next full 30 days       Core Components/Risk Factors/Patient Goals at Discharge (Final Review):      Goals and Risk Factor Review - 06/13/16 0910      Core Components/Risk Factors/Patient Goals Review   Review Just started program, too early to see progress   Expected Outcomes expect progress in the next full 30 days      ITP Comments:   Comments: ITP REVIEW Pt is making expected progress toward pulmonary rehab goals after completing 1 session.  Recommend continued exercise, life style modification, education, and utilization of breathing techniques to increase stamina and strength and decrease shortness of breath with exertion.

## 2016-06-18 ENCOUNTER — Encounter (HOSPITAL_COMMUNITY)
Admission: RE | Admit: 2016-06-18 | Discharge: 2016-06-18 | Disposition: A | Discharge status: Home / Self Care | Payer: Medicare Other | Source: Ambulatory Visit | Attending: Internal Medicine | Admitting: Internal Medicine

## 2016-06-18 VITALS — Wt 219.4 lb

## 2016-06-18 DIAGNOSIS — I509 Heart failure, unspecified: Secondary | ICD-10-CM

## 2016-06-18 DIAGNOSIS — Z952 Presence of prosthetic heart valve: Secondary | ICD-10-CM

## 2016-06-18 NOTE — Progress Notes (Signed)
Daily Session Note  Patient Details  Name: [name] MRN: [id number] Date of Birth: 03-15-1951 Referring Provider:   April Manson Pulmonary Rehab Walk Test from 06/04/2016 in [place]  Referring Provider  Dr. Haroldine Laws      Encounter Date: 06/18/2016  Check In:     Session Check In - 06/18/16 1330      Check-In   Location [facility]   Staff Present Su Hilt, MS, ACSM RCEP, Exercise Physiologist;Joan Leonia Reeves, RN, BSN;Lisa Ysidro Evert, RN;Portia Rollene Rotunda, RN, BSN   Supervising physician immediately available to respond to emergencies [facility] immediately available   Physician(s) Dr. Candiss Norse   Medication changes reported     No   Fall or balance concerns reported    No   Warm-up and Cool-down Performed as group-led instruction   Resistance Training Performed Yes   VAD Patient? No     Pain Assessment   Currently in Pain? No/denies   Multiple Pain Sites No      Capillary Blood Glucose: No results found for this or any previous visit (from the past 24 hour(s)).      Exercise Prescription Changes - 06/18/16 1552      Response to Exercise   Blood Pressure (Admit) 124/62   Blood Pressure (Exercise) 104/70   Blood Pressure (Exit) 136/70   Heart Rate (Admit) 85 bpm   Heart Rate (Exercise) 79 bpm   Heart Rate (Exit) 82 bpm   Oxygen Saturation (Admit) 98 %   Oxygen Saturation (Exercise) 95 %   Oxygen Saturation (Exit) 99 %   Rating of Perceived Exertion (Exercise) 13   Perceived Dyspnea (Exercise) 3   Duration Progress to 45 minutes of aerobic exercise without signs/symptoms of physical distress   Intensity Other (comment)  40-80%of HRR     Progression   Progression Continue to progress workloads to maintain intensity without signs/symptoms of physical distress.     Resistance Training   Training Prescription Yes   Weight blue bands   Reps 10-12  10 minutes of strength training     Interval Training   Interval Training Yes     Oxygen   Oxygen Continuous   Liters 2     Bike   Level 0.5   Minutes 17     NuStep   Level 2   Minutes 34   METs 1.4     Track   Laps --   Minutes --     Goals Met:  Exercise tolerated well Queuing for purse lip breathing No report of cardiac concerns or symptoms Strength training completed today  Goals Unmet:  Not Applicable  Comments: Service time is from 1330 to 1500   Dr. Rush Farmer is Medical Director for Pulmonary Rehab at [facility].

## 2016-06-20 ENCOUNTER — Telehealth (HOSPITAL_COMMUNITY): Payer: Self-pay | Admitting: Internal Medicine

## 2016-06-20 ENCOUNTER — Encounter (HOSPITAL_COMMUNITY): Payer: Medicare Other

## 2016-06-21 ENCOUNTER — Other Ambulatory Visit (HOSPITAL_COMMUNITY): Payer: Self-pay | Admitting: Internal Medicine

## 2016-06-25 ENCOUNTER — Encounter (HOSPITAL_COMMUNITY)
Admission: RE | Admit: 2016-06-25 | Discharge: 2016-06-25 | Disposition: A | Discharge status: Home / Self Care | Payer: Medicare Other | Source: Ambulatory Visit | Attending: Internal Medicine | Admitting: Internal Medicine

## 2016-06-25 VITALS — Wt 218.5 lb

## 2016-06-25 DIAGNOSIS — I509 Heart failure, unspecified: Secondary | ICD-10-CM | POA: Diagnosis not present

## 2016-06-25 NOTE — Progress Notes (Signed)
Daily Session Note  Patient Details  Name: [name] MRN: [id number] Date of Birth: 03/03/1951 Referring Provider:   April Manson Pulmonary Rehab Walk Test from 06/04/2016 in [place]  Referring Provider  Dr. Haroldine Laws      Encounter Date: 06/25/2016  Check In:     Session Check In - 06/25/16 1330      Check-In   Location [facility]   Staff Present Su Hilt, MS, ACSM RCEP, Exercise Physiologist;Joan Leonia Reeves, RN, BSN;Lisa Ysidro Evert, RN;Portia Rollene Rotunda, RN, BSN;Other  cardiopulmonary rehab interns   Supervising physician immediately available to respond to emergencies [facility] immediately available   Physician(s) Dr. Posey Pronto   Medication changes reported     No   Fall or balance concerns reported    No   Tobacco Cessation No Change   Warm-up and Cool-down Performed as group-led instruction   Resistance Training Performed Yes   VAD Patient? No     Pain Assessment   Currently in Pain? No/denies   Multiple Pain Sites No      Capillary Blood Glucose: No results found for this or any previous visit (from the past 24 hour(s)).      Exercise Prescription Changes - 06/25/16 1500      Response to Exercise   Blood Pressure (Admit) 134/56   Blood Pressure (Exercise) 126/66   Blood Pressure (Exit) 94/60   Heart Rate (Admit) 60 bpm   Heart Rate (Exercise) 77 bpm   Heart Rate (Exit) 65 bpm   Oxygen Saturation (Admit) 96 %   Oxygen Saturation (Exercise) 95 %   Oxygen Saturation (Exit) 100 %   Rating of Perceived Exertion (Exercise) 15   Perceived Dyspnea (Exercise) 3   Duration Progress to 45 minutes of aerobic exercise without signs/symptoms of physical distress   Intensity THRR unchanged     Progression   Progression Continue to progress workloads to maintain intensity without signs/symptoms of physical distress.     Resistance Training   Training Prescription Yes   Weight blue bands   Reps 10-15   Time  10 Minutes     Interval Training   Interval Training No     Oxygen   Oxygen Continuous   Liters 2     Bike   Level 0.5   Minutes 17     NuStep   Level 2   Minutes 17   METs 1.7     Track   Laps 8   Minutes 17      History  Smoking Status  . Former Smoker  . Packs/day: 0.50  . Years: 15.00  . Types: Cigarettes  . Quit date: 07/18/1991  Smokeless Tobacco  . Never Used    Goals Met:  Exercise tolerated well Strength training completed today  Goals Unmet:  Not Applicable  Comments: Service time is from 1330 to 1450    Dr. Rush Farmer is Medical Director for Pulmonary Rehab at [facility].

## 2016-06-27 ENCOUNTER — Encounter (HOSPITAL_COMMUNITY): Payer: Self-pay | Admitting: Internal Medicine

## 2016-06-27 ENCOUNTER — Encounter (HOSPITAL_COMMUNITY)
Admission: RE | Admit: 2016-06-27 | Discharge: 2016-06-27 | Disposition: A | Discharge status: Home / Self Care | Payer: Medicare Other | Source: Ambulatory Visit | Attending: Internal Medicine | Admitting: Internal Medicine

## 2016-06-27 ENCOUNTER — Ambulatory Visit (HOSPITAL_COMMUNITY)
Admission: RE | Admit: 2016-06-27 | Discharge: 2016-06-27 | Disposition: A | Discharge status: Home / Self Care | Payer: Medicare Other | Source: Ambulatory Visit | Attending: Internal Medicine | Admitting: Internal Medicine

## 2016-06-27 VITALS — BP 130/66 | HR 61 | Wt 221.5 lb

## 2016-06-27 VITALS — Wt 220.7 lb

## 2016-06-27 DIAGNOSIS — Z823 Family history of stroke: Secondary | ICD-10-CM | POA: Insufficient documentation

## 2016-06-27 DIAGNOSIS — Z7951 Long term (current) use of inhaled steroids: Secondary | ICD-10-CM | POA: Insufficient documentation

## 2016-06-27 DIAGNOSIS — R7303 Prediabetes: Secondary | ICD-10-CM | POA: Diagnosis not present

## 2016-06-27 DIAGNOSIS — M797 Fibromyalgia: Secondary | ICD-10-CM | POA: Diagnosis not present

## 2016-06-27 DIAGNOSIS — Z952 Presence of prosthetic heart valve: Secondary | ICD-10-CM

## 2016-06-27 DIAGNOSIS — Z9981 Dependence on supplemental oxygen: Secondary | ICD-10-CM | POA: Diagnosis not present

## 2016-06-27 DIAGNOSIS — I5032 Chronic diastolic (congestive) heart failure: Secondary | ICD-10-CM | POA: Diagnosis not present

## 2016-06-27 DIAGNOSIS — Z950 Presence of cardiac pacemaker: Secondary | ICD-10-CM | POA: Diagnosis not present

## 2016-06-27 DIAGNOSIS — Z7984 Long term (current) use of oral hypoglycemic drugs: Secondary | ICD-10-CM | POA: Diagnosis not present

## 2016-06-27 DIAGNOSIS — G4733 Obstructive sleep apnea (adult) (pediatric): Secondary | ICD-10-CM | POA: Diagnosis not present

## 2016-06-27 DIAGNOSIS — Z87891 Personal history of nicotine dependence: Secondary | ICD-10-CM | POA: Diagnosis not present

## 2016-06-27 DIAGNOSIS — Z881 Allergy status to other antibiotic agents status: Secondary | ICD-10-CM | POA: Diagnosis not present

## 2016-06-27 DIAGNOSIS — I10 Essential (primary) hypertension: Secondary | ICD-10-CM | POA: Diagnosis not present

## 2016-06-27 DIAGNOSIS — I272 Pulmonary hypertension, unspecified: Secondary | ICD-10-CM

## 2016-06-27 DIAGNOSIS — E669 Obesity, unspecified: Secondary | ICD-10-CM | POA: Diagnosis not present

## 2016-06-27 DIAGNOSIS — Z888 Allergy status to other drugs, medicaments and biological substances status: Secondary | ICD-10-CM | POA: Insufficient documentation

## 2016-06-27 DIAGNOSIS — K219 Gastro-esophageal reflux disease without esophagitis: Secondary | ICD-10-CM | POA: Diagnosis not present

## 2016-06-27 DIAGNOSIS — I480 Paroxysmal atrial fibrillation: Secondary | ICD-10-CM

## 2016-06-27 DIAGNOSIS — J9611 Chronic respiratory failure with hypoxia: Secondary | ICD-10-CM

## 2016-06-27 DIAGNOSIS — I509 Heart failure, unspecified: Secondary | ICD-10-CM

## 2016-06-27 DIAGNOSIS — E785 Hyperlipidemia, unspecified: Secondary | ICD-10-CM | POA: Diagnosis not present

## 2016-06-27 DIAGNOSIS — G2581 Restless legs syndrome: Secondary | ICD-10-CM | POA: Diagnosis not present

## 2016-06-27 DIAGNOSIS — Z953 Presence of xenogenic heart valve: Secondary | ICD-10-CM

## 2016-06-27 DIAGNOSIS — J449 Chronic obstructive pulmonary disease, unspecified: Secondary | ICD-10-CM | POA: Insufficient documentation

## 2016-06-27 DIAGNOSIS — I110 Hypertensive heart disease with heart failure: Secondary | ICD-10-CM | POA: Insufficient documentation

## 2016-06-27 DIAGNOSIS — E890 Postprocedural hypothyroidism: Secondary | ICD-10-CM | POA: Insufficient documentation

## 2016-06-27 DIAGNOSIS — I5022 Chronic systolic (congestive) heart failure: Secondary | ICD-10-CM

## 2016-06-27 DIAGNOSIS — Z7901 Long term (current) use of anticoagulants: Secondary | ICD-10-CM | POA: Insufficient documentation

## 2016-06-27 DIAGNOSIS — Z79899 Other long term (current) drug therapy: Secondary | ICD-10-CM | POA: Diagnosis not present

## 2016-06-27 DIAGNOSIS — I2510 Atherosclerotic heart disease of native coronary artery without angina pectoris: Secondary | ICD-10-CM | POA: Diagnosis not present

## 2016-06-27 DIAGNOSIS — Z8249 Family history of ischemic heart disease and other diseases of the circulatory system: Secondary | ICD-10-CM | POA: Diagnosis not present

## 2016-06-27 DIAGNOSIS — Z6830 Body mass index (BMI) 30.0-30.9, adult: Secondary | ICD-10-CM | POA: Insufficient documentation

## 2016-06-27 LAB — BASIC METABOLIC PANEL
Anion gap: 6 (ref 5–15)
BUN: 14 mg/dL (ref 6–20)
CO2: 28 mmol/L (ref 22–32)
Calcium: 9.3 mg/dL (ref 8.9–10.3)
Chloride: 102 mmol/L (ref 101–111)
Creatinine, Ser: 1.67 mg/dL — ABNORMAL HIGH (ref 0.61–1.24)
GFR calc Af Amer: 47 mL/min — ABNORMAL LOW (ref >60)
GFR calc non Af Amer: 41 mL/min — ABNORMAL LOW (ref >60)
Glucose, Bld: 82 mg/dL (ref 65–99)
Potassium: 4.1 mmol/L (ref 3.5–5.1)
Sodium: 136 mmol/L (ref 135–145)

## 2016-06-27 MED ORDER — POTASSIUM CHLORIDE ER 10 MEQ PO TBCR: 20.0000 meq | EXTENDED_RELEASE_TABLET | Freq: Two times a day (BID) | ORAL | 3 refills | Status: DC

## 2016-06-27 MED ORDER — METOLAZONE 2.5 MG PO TABS: 2.5000 mg | ORAL_TABLET | ORAL | 3 refills | Status: DC | PRN

## 2016-06-27 NOTE — Progress Notes (Signed)
Advanced Heart Failure Clinic Note   Primary Care: Velna Hatchet, MD Primary Cardiologist: Dr. Acie Fredrickson Primary HF: Dr. Haroldine Laws   HPI:  [name] is a 66 y.o. 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   Admitted from Dr. Acie Fredrickson office 02/02/16 with severe DOE with any exercise or walking 10-20 feet. Pt admitted and planned for [name] 02/05/16. Diuresed with IV lasix with rapid improvement of symptoms. Overall diuresed 15.2 L and down 14 lbs. Discharge weight was 205 lbs.  Echo 02/02/16 LVEF 60-65%, Grade 3 DD. Mild AI, Mild MR, RV mildly dilated and severely reduced. RA mildly dilated. PA peak pressure 74 mm Hg. Concerns for restrictive cardiomyopathy.  L/RHC 02/05/16 with no significant CAD. RHC showed significant difference between PCWP (50) and LVEDP (30) suggestive of MS but no MS on Echo. Weight at d/c was 205 pounds.  Echo 02/08/16 LVEF 55-60%, Restrictive physiology, (?)RV normal. No assessment of PA pressures.  Admitted 12/19 -> 04/19/16 for Tikosyn load. RHC completed during that admission as below.   He presents today for regular follow.  Still doesn't feel that much different on Tikosyn.  Has good and bad days.  Tires out "real bad" in a couple of hours tinkering in his shed (working on a tractor).  Gets very short of breath walking up steps. SOB walking from front desk unless he walks slowly. Occasional lightheadedness with rapid standing but not marked or limiting. Has chronic calf/thigh pain. Occasionally has ankle edema. Weight at home ~212.  Weight up 5 lbs by clinic scales.  Has taken extra torsemide a few times with relief. Drinking > 2L and eating ice throughout the day  [employer] Jude PPM Interrogation -> AT/AF burden <1%.   [place] 04/17/16 RA = 13 RV = 66/3/15 PA = 69/23 (42) PCW = 21 (v=38) Fick cardiac output/index = 6.3/2.9 PVR = 3.8  WU Ao sat = 87% (radial artery ABG) PA sat = 43%, 43% SVC sat = 50%   L/RHC 02/05/16 - Mid RCA lesion, 10 %stenosed. - Ost Cx to Prox Cx lesion, 20 %stenosed. - LV end diastolic pressure is moderately elevated. - Hemodynamic findings consistent with severe pulmonary hypertension. - No evidence of discordance on the LV/RV tracing. No evidence of pericardial constriction. - Severly elevated PCWP. - PA sat 51%. CO 6.2 L/min. Cardiac index 2.87. - No significant aortic valve gradient. - There is no aortic valve stenosis.  RHC Procedural Findings: Hemodynamics (mmHg) RA mean 30 RV 87/9 (26) PA 100/56 (72) PCWP 51 LVEDP 31 AO 142/73 (100) Cardiac Output (Fick) 6.21 Cardiac Index (Fick) 2.87 PVR = 3.5 WU    PFTs 4/17  FEV1  1.6 (47%) FVC 2.25 (49%) DLCO 54%   Past Medical History:  Diagnosis Date  . Aortic stenosis, mild   . Arthritis of hand    "just a little bit in both hands" (03/31/2012)  . Asthma    "little bit" (03/31/2012)  . Atrial fibrillation [facility])    dx '04; DCCV '04, placed on flecainide, failed DCCV 04/2010, flecainide stopped; s/p successful A.fib ablation 01/31/12  . CHF (congestive heart failure) ([place])   . Diabetes mellitus without complication (HCC)    borderline  . DJD (degenerative joint disease)   . Exertional dyspnea   . Fibromyalgia   . First degree AV block    post ablation, ~442ms  .  GERD (gastroesophageal reflux disease)   . Heart murmur   . Hyperlipidemia   . Hypertension   . Hypothyroidism    S/P radiation  . Left atrial enlargement    LA size 61mm by echo 11/21/11  . Mitral regurgitation    trivial  . Obesity   . Obstructive sleep apnea    mild by sleep study 2013; pt stated he does not have a machine because "it wasn't bad enough for him to have one"  . Pacemaker 03/31/2012  . Restless leg syndrome   . Second degree AV block   . Visit for monitoring Tikosyn therapy 04/16/2016    Current Outpatient Prescriptions  Medication Sig  Dispense Refill  . albuterol (PROVENTIL HFA;VENTOLIN HFA) 108 (90 Base) MCG/ACT inhaler Inhale 2 puffs into the lungs every 6 (six) hours as needed for wheezing or shortness of breath. 1 Inhaler 5  . amLODipine (NORVASC) 10 MG tablet Take 0.5 tablets (5 mg total) by mouth daily. 30 tablet 11  . apixaban (ELIQUIS) 5 MG TABS tablet Take 1 tablet (5 mg total) by mouth 2 (two) times daily. 180 tablet 3  . atorvastatin (LIPITOR) 40 MG tablet Take 1 tablet (40 mg total) by mouth daily at 6 PM. 90 tablet 3  . budesonide-formoterol (SYMBICORT) 160-4.5 MCG/ACT inhaler Inhale 2 puffs into the lungs 2 (two) times daily. 1 Inhaler 6  . Cholecalciferol (VITAMIN D) 2000 UNITS CAPS Take 2,000 Units by mouth daily.    [name] dofetilide (TIKOSYN) 250 MCG capsule Take 1 capsule (250 mcg total) by mouth 2 (two) times daily. 60 capsule 3  . fexofenadine (ALLEGRA) 180 MG tablet Take 1 tablet (180 mg total) by mouth daily. 30 tablet 1  . fluticasone (FLONASE) 50 MCG/ACT nasal spray Place into both nostrils 2 (two) times daily as needed for allergies or rhinitis.    [name] gabapentin (NEURONTIN) 300 MG capsule Take 900 mg by mouth at bedtime.   3  . guaiFENesin (MUCINEX) 600 MG 12 hr tablet Take 600 mg by mouth 2 (two) times daily.    [name] levalbuterol (XOPENEX) 0.63 MG/3ML nebulizer solution Take 3 mLs (0.63 mg total) by nebulization every 6 (six) hours as needed for wheezing or shortness of breath. 360 mL 1  . levothyroxine (SYNTHROID, LEVOTHROID) 175 MCG tablet Take 175 mcg by mouth daily before breakfast.    . metFORMIN (GLUMETZA) 500 MG (MOD) 24 hr tablet Take 500 mg by mouth daily with breakfast.    . metoprolol (LOPRESSOR) 50 MG tablet Take 1 tablet (50 mg total) by mouth 2 (two) times daily. 60 tablet 11  . pantoprazole (PROTONIX) 40 MG tablet Take 40 mg by mouth daily.    . potassium chloride (K-DUR) 10 MEQ tablet Take 2 tablets (20 mEq total) by mouth 2 (two) times daily. 360 tablet 3  . rOPINIRole (REQUIP) 1 MG tablet Take  1 tablet (1 mg total) by mouth daily. 90 tablet 2  . spironolactone (ALDACTONE) 25 MG tablet Take 1 tablet (25 mg total) by mouth daily. 90 tablet 3  . torsemide (DEMADEX) 20 MG tablet Take 2 tablets (40 mg total) by mouth daily. 60 tablet 6  . traMADol (ULTRAM) 50 MG tablet Take 50 mg by mouth daily. Reported on 10/17/2015    . traZODone (DESYREL) 50 MG tablet Take 25 mg by mouth at bedtime.    . promethazine (PHENERGAN) 25 MG tablet Take 25 mg by mouth every 6 (six) hours as needed for nausea or vomiting.    [name]  valACYclovir (VALTREX) 1000 MG tablet Take 1 tablet by mouth every 12 (twelve) hours as needed (fever blisters). Reported on 10/17/2015     No current facility-administered medications for this encounter.     Allergies  Allergen Reactions  . Meloxicam Rash  . 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 syndrome 09/02/15, resolved with diphenhydramine and slowing of rate      Social History   Social History  . Marital status: Married    Spouse name: N/A  . Number of children: N/A  . Years of education: N/A   Occupational History  . Not on file.   Social History Main Topics  . Smoking status: Former Smoker    Packs/day: 0.50    Years: 15.00    Types: Cigarettes    Quit date: 07/18/1991  . Smokeless tobacco: Never Used  . Alcohol use Yes     Comment: occasional  . Drug use: Yes    Types: Marijuana  . Sexual activity: Not Currently   Other Topics Concern  . Not on file   Social History Narrative   Lives in [place] with spouse.  Works as a Air traffic controller.    Family History  Problem Relation Age of Onset  . Heart failure Mother   . Stroke Father     Vitals:   06/27/16 1138  BP: 130/66  Pulse: 61  SpO2: 95%  Weight: 221 lb 8 oz (100.5 kg)    Wt Readings from Last 3 Encounters:  06/27/16 221 lb 8 oz (100.5 kg)  06/25/16 218 lb 7.6 oz (99.1 kg)  06/18/16 219 lb 5.7 oz (99.5 kg)     PHYSICAL EXAM: General: Seated in chair. NAD.    HEENT:  [place]a. +Coy with 02 x 2L.  Neck: Supple. JVP 9-10 cm. Carotids 2+ bilat; no bruits. No thyromegaly or nodule noted.    Cor: PMI nondisplaced. RRR. No M/G/R.  Lungs: Clear, mildly diminished basilar sounds.  Abdomen: soft, NT, ND, no HSM. No bruits or masses. +BS  Umbilical hernia present. Extremities: No cyanosis, clubbing, rash. 1+ peripheral edema.   Neuro: alert & orientedx3, cranial nerves grossly intact. moves all 4 extremities w/o difficulty. Affect pleasant  ASSESSMENT & PLAN:   1. Chronic diastolic CHF with RV failure - Echo 02/02/16 LVEF 60-65%, Grade 3 DD. Mild AI, Mild MR, RV mildly dilated and severely reduced. RA mildly dilated. PA peak pressure 74 mm Hg. Concerns for restrictive cardiomyopathy. - Echo 02/08/16 LVEF 55-60%, Restrictive physiology, (?)RV normal. No assessment of PA pressures. - Volume status elevated on exam.     - Continue torsemide 40 mg daily. Can take 20 mg extra as needed.  BMET/BNP today. - Take 2.5 mg metolazone tomorrow +/- Saturday and repeat up to once weekly as needed.  Take extra 20 meq of K on days he takes metolazone. - Continue potassium 20 meq BID. (extra on metolazone days as above.) - Continue spiro 25 mg daily.  - Continue amlodipine 5 mg daily. - Continue lopressor 50 mg BID  - Reinforced fluid restriction to < 2 L daily, sodium restriction to less than 2000 mg daily, and the importance of daily weights.   2. Paroxysmal Afib s/p AF ablation/Maze s/p pacemaker - Seen by Dr. Rayann Heman 05/20/16. Remains in NSR s/p recent hospitalization with Tikosyn initiation.  - Remains in NSR per ICD interrogation. - Continue Eliquis 2.5 mg BID. No bleeding.  3. AS s/p AVR - Stable bioprosthesis with mild regurgitation on Echo 02/08/16.  No change to current plan.   4. Pulmonary HTN - suspect pulmonary venous hypertension -RHC in 10/17 with pulmonary venous HTN priamrily 5. Mild CAD by cath 01/2016 - Medical management. Continue atorvastatin 40 mg daily - No  CP. No change to current plan.  6. Chronic respiratory failure with hypoxia - Continue home 02.  - Follow sup with pulmonary.   7. HTN - Will leave on current meds with diuresis and fatigue.    Adding metolazone as above. BMET today and 2 weeks. Follow up 3 months.   Shirley Friar, PA-C  06/27/16   Patient seen and examined with Oda Kilts, PA-C. We discussed all aspects of the encounter. I agree with the assessment and plan as stated above.   He is volume overloaded in the setting of high fluid intake. Will treat with metolazone 2.5 for 2 days and can take one a week for volume overload. Long talk about need for fluid restriction. May be candidate for Cardiomems down the road. Check labs next week.

## 2016-06-27 NOTE — Patient Instructions (Signed)
Take Metolazone 2.5 mg tomorrow, can take another on Saturday if needed.  THEN take as needed, DO NOT TAKE MORE THAN ONCE A WEEK.  Take an extra 20 meq (2 tabs) of Potassium when you take Metolazone   Labs today  Labs in 2 weeks  Your physician recommends that you schedule a follow-up appointment in: 3 months

## 2016-06-27 NOTE — Progress Notes (Signed)
Daily Session Note  Patient Details  Name: [name] MRN: [id number] Date of Birth: 04/28/1951 Referring Provider:   Flowsheet Row Pulmonary Rehab Walk Test from 06/04/2016 in [facility]  Referring Provider  Dr. Bensimhon      Encounter Date: 06/27/2016  Check In:     Session Check In - 06/27/16 1332      Check-In   Location [facility]   Staff Present Molly diVincenzo, MS, ACSM RCEP, Exercise Physiologist;Portia Payne, RN, BSN;Joan Behrens, RN, BSN; , RN   Supervising physician immediately available to respond to emergencies [facility] immediately available   Physician(s) Dr. Ghirmire   Medication changes reported     No   Fall or balance concerns reported    No   Tobacco Cessation No Change   Warm-up and Cool-down Performed as group-led instruction   Resistance Training Performed Yes   VAD Patient? No     Pain Assessment   Currently in Pain? No/denies   Multiple Pain Sites No      Capillary Blood Glucose: Results for orders placed or performed during the hospital encounter of 06/27/16 (from the past 24 hour(s))  Basic metabolic panel     Status: Abnormal   Collection Time: 06/27/16 12:36 PM  Result Value Ref Range   Sodium 136 135 - 145 mmol/L   Potassium 4.1 3.5 - 5.1 mmol/L   Chloride 102 101 - 111 mmol/L   CO2 28 22 - 32 mmol/L   Glucose, Bld 82 65 - 99 mg/dL   BUN 14 6 - 20 mg/dL   Creatinine, Ser 1.67 (H) 0.61 - 1.24 mg/dL   Calcium 9.3 8.9 - 10.3 mg/dL   GFR calc non Af Amer 41 (L) >60 mL/min   GFR calc Af Amer 47 (L) >60 mL/min   Anion gap 6 5 - 15       POCT Glucose - 06/27/16 1529      POCT Blood Glucose   Pre-Exercise 100 mg/dL  pt given snacks before exercise   Post-Exercise 99 mg/dL  pt given snack after exercise         Exercise Prescription Changes - 06/27/16 1500      Response to Exercise   Blood Pressure (Admit) 122/70   Blood Pressure (Exercise) 140/70   Blood  Pressure (Exit) 98/50   Heart Rate (Admit) 94 bpm   Heart Rate (Exercise) 82 bpm   Heart Rate (Exit) 61 bpm   Oxygen Saturation (Admit) 94 %   Oxygen Saturation (Exercise) 90 %   Oxygen Saturation (Exit) 100 %   Rating of Perceived Exertion (Exercise) 15   Perceived Dyspnea (Exercise) 1   Duration Progress to 45 minutes of aerobic exercise without signs/symptoms of physical distress   Intensity THRR unchanged     Progression   Progression Continue to progress workloads to maintain intensity without signs/symptoms of physical distress.     Resistance Training   Training Prescription Yes   Weight blue bands   Reps 10-15   Time 10 Minutes     Interval Training   Interval Training No     Oxygen   Oxygen Continuous   Liters 2     Bike   Level 0.8   Minutes 17     Track   Laps 7   Minutes 17      History  Smoking Status  . Former Smoker  . Packs/day: 0.50  . Years: 15.00  . Types: Cigarettes  .   Quit date: 07/18/1991  Smokeless Tobacco  . Never Used    Goals Met:  Exercise tolerated well No report of cardiac concerns or symptoms Strength training completed today  Goals Unmet:  Not Applicable  Comments: Service time is from 1330 to 1420    Dr. Rush Farmer is Medical Director for Pulmonary Rehab at [facility].

## 2016-06-28 NOTE — Progress Notes (Signed)
I have reviewed a Home Exercise Prescription with [name] is not currently exercising at home.  The patient was advised to walk 2-3 days a week for 30-45 minutes.  [name] and I discussed how to progress their exercise prescription.  The patient stated that their goals were to be able to do more around the house and be less short of breath.  The patient stated that they understand the exercise prescription.  I also reviewed with wife. We reviewed exercise guidelines, target heart rate during exercise, oxygen use, weather, home pulse oximeter, endpoints for exercise, and goals.  Patient is encouraged to come to me with any questions. I will continue to follow up with the patient to assist them with progression and safety.

## 2016-07-02 ENCOUNTER — Encounter (HOSPITAL_COMMUNITY)
Admission: RE | Admit: 2016-07-02 | Discharge: 2016-07-02 | Disposition: A | Discharge status: Home / Self Care | Payer: Medicare Other | Source: Ambulatory Visit | Attending: Internal Medicine | Admitting: Internal Medicine

## 2016-07-02 VITALS — Wt 219.8 lb

## 2016-07-02 DIAGNOSIS — I509 Heart failure, unspecified: Secondary | ICD-10-CM

## 2016-07-02 NOTE — Progress Notes (Signed)
Daily Session Note  Patient Details  Name: [name] MRN: [id number] Date of Birth: 08-01-1950 Referring Provider:   April Manson Pulmonary Rehab Walk Test from 06/04/2016 in [place]  Referring Provider  Dr. Haroldine Laws      Encounter Date: 07/02/2016  Check In:     Session Check In - 07/02/16 1330      Check-In   Location [facility]   Staff Present Trish Fountain, RN, Maxcine Ham, RN, BSN;Lisa Ysidro Evert, RN;Other;Molly diVincenzo, MS, ACSM RCEP, Exercise Physiologist  cardiopulmonary rehab intern   Supervising physician immediately available to respond to emergencies [facility] immediately available   Physician(s) Dr. Candiss Norse   Medication changes reported     No   Fall or balance concerns reported    No   Tobacco Cessation No Change   Warm-up and Cool-down Performed as group-led instruction   Resistance Training Performed Yes   VAD Patient? No     Pain Assessment   Currently in Pain? No/denies   Multiple Pain Sites No      Capillary Blood Glucose: No results found for this or any previous visit (from the past 24 hour(s)).      Exercise Prescription Changes - 07/02/16 1512      Response to Exercise   Blood Pressure (Admit) 130/80   Blood Pressure (Exercise) 144/60   Blood Pressure (Exit) 124/60   Heart Rate (Admit) 82 bpm   Heart Rate (Exercise) 95 bpm   Heart Rate (Exit) 63 bpm   Oxygen Saturation (Admit) 100 %   Oxygen Saturation (Exercise) 94 %   Oxygen Saturation (Exit) 100 %   Rating of Perceived Exertion (Exercise) 13   Perceived Dyspnea (Exercise) 3   Duration Progress to 45 minutes of aerobic exercise without signs/symptoms of physical distress   Intensity THRR unchanged     Progression   Progression Continue to progress workloads to maintain intensity without signs/symptoms of physical distress.     Resistance Training   Training Prescription Yes   Weight blue bands   Reps 10-15   Time  10 Minutes     Interval Training   Interval Training No     Oxygen   Oxygen Continuous   Liters 2     Bike   Level 0.8   Minutes 17     NuStep   Level 3   Minutes 17   METs 1.8     Track   Laps 15   Minutes 17      History  Smoking Status  . Former Smoker  . Packs/day: 0.50  . Years: 15.00  . Types: Cigarettes  . Quit date: 07/18/1991  Smokeless Tobacco  . Never Used    Goals Met:  Using PLB without cueing & demonstrates good technique Exercise tolerated well No report of cardiac concerns or symptoms Strength training completed today  Goals Unmet:  Not Applicable  Comments: Service time is from 1330 to 1455   Dr. Rush Farmer is Medical Director for Pulmonary Rehab at [facility].

## 2016-07-04 ENCOUNTER — Encounter (HOSPITAL_COMMUNITY)
Admission: RE | Admit: 2016-07-04 | Discharge: 2016-07-04 | Disposition: A | Discharge status: Home / Self Care | Payer: Medicare Other | Source: Ambulatory Visit | Attending: Internal Medicine | Admitting: Internal Medicine

## 2016-07-09 ENCOUNTER — Encounter (HOSPITAL_COMMUNITY)
Admission: RE | Admit: 2016-07-09 | Discharge: 2016-07-09 | Disposition: A | Discharge status: Home / Self Care | Payer: Medicare Other | Source: Ambulatory Visit | Attending: Internal Medicine | Admitting: Internal Medicine

## 2016-07-09 VITALS — Wt 218.0 lb

## 2016-07-09 DIAGNOSIS — I509 Heart failure, unspecified: Secondary | ICD-10-CM | POA: Diagnosis not present

## 2016-07-09 NOTE — Progress Notes (Signed)
Pulmonary Individual Treatment Plan  Patient Details  Name: [name] MRN: [id number] Date of Birth: 04-11-1951 Referring Provider:   April Manson Pulmonary Rehab Walk Test from 06/04/2016 in [place]  Referring Provider  Dr. Haroldine Laws      Initial Encounter Date:  Flowsheet Row Pulmonary Rehab Walk Test from 06/04/2016 in [place]  Date  06/04/16  Referring Provider  Dr. Haroldine Laws      Visit Diagnosis: Acute on chronic heart failure, unspecified heart failure type ([place])  Patient's Home Medications on Admission:   Current Outpatient Prescriptions:  .  albuterol (PROVENTIL HFA;VENTOLIN HFA) 108 (90 Base) MCG/ACT inhaler, Inhale 2 puffs into the lungs every 6 (six) hours as needed for wheezing or shortness of breath., Disp: 1 Inhaler, Rfl: 5 .  amLODipine (NORVASC) 10 MG tablet, Take 0.5 tablets (5 mg total) by mouth daily., Disp: 30 tablet, Rfl: 11 .  apixaban (ELIQUIS) 5 MG TABS tablet, Take 1 tablet (5 mg total) by mouth 2 (two) times daily., Disp: 180 tablet, Rfl: 3 .  atorvastatin (LIPITOR) 40 MG tablet, Take 1 tablet (40 mg total) by mouth daily at 6 PM., Disp: 90 tablet, Rfl: 3 .  budesonide-formoterol (SYMBICORT) 160-4.5 MCG/ACT inhaler, Inhale 2 puffs into the lungs 2 (two) times daily., Disp: 1 Inhaler, Rfl: 6 .  Cholecalciferol (VITAMIN D) 2000 UNITS CAPS, Take 2,000 Units by mouth daily., Disp: , Rfl:  .  dofetilide (TIKOSYN) 250 MCG capsule, Take 1 capsule (250 mcg total) by mouth 2 (two) times daily., Disp: 60 capsule, Rfl: 3 .  fexofenadine (ALLEGRA) 180 MG tablet, Take 1 tablet (180 mg total) by mouth daily., Disp: 30 tablet, Rfl: 1 .  fluticasone (FLONASE) 50 MCG/ACT nasal spray, Place into both nostrils 2 (two) times daily as needed for allergies or rhinitis., Disp: , Rfl:  .  gabapentin (NEURONTIN) 300 MG capsule, Take 900 mg by mouth at bedtime. , Disp: , Rfl: 3 .  guaiFENesin (MUCINEX) 600 MG 12  hr tablet, Take 600 mg by mouth 2 (two) times daily., Disp: , Rfl:  .  levalbuterol (XOPENEX) 0.63 MG/3ML nebulizer solution, Take 3 mLs (0.63 mg total) by nebulization every 6 (six) hours as needed for wheezing or shortness of breath., Disp: 360 mL, Rfl: 1 .  levothyroxine (SYNTHROID, LEVOTHROID) 175 MCG tablet, Take 175 mcg by mouth daily before breakfast., Disp: , Rfl:  .  metFORMIN (GLUMETZA) 500 MG (MOD) 24 hr tablet, Take 500 mg by mouth daily with breakfast., Disp: , Rfl:  .  metolazone (ZAROXOLYN) 2.5 MG tablet, Take 1 tablet (2.5 mg total) by mouth as needed. Do not take more than once a week, Disp: 5 tablet, Rfl: 3 .  metoprolol (LOPRESSOR) 50 MG tablet, Take 1 tablet (50 mg total) by mouth 2 (two) times daily., Disp: 60 tablet, Rfl: 11 .  pantoprazole (PROTONIX) 40 MG tablet, Take 40 mg by mouth daily., Disp: , Rfl:  .  potassium chloride (K-DUR) 10 MEQ tablet, Take 2 tablets (20 mEq total) by mouth 2 (two) times daily. Take an extra 2 tabs when you take Metolazone, Disp: 360 tablet, Rfl: 3 .  promethazine (PHENERGAN) 25 MG tablet, Take 25 mg by mouth every 6 (six) hours as needed for nausea or vomiting., Disp: , Rfl:  .  rOPINIRole (REQUIP) 1 MG tablet, Take 1 tablet (1 mg total) by mouth daily., Disp: 90 tablet, Rfl: 2 .  spironolactone (ALDACTONE) 25 MG tablet, Take 1 tablet (  25 mg total) by mouth daily., Disp: 90 tablet, Rfl: 3 .  torsemide (DEMADEX) 20 MG tablet, Take 2 tablets (40 mg total) by mouth daily., Disp: 60 tablet, Rfl: 6 .  traMADol (ULTRAM) 50 MG tablet, Take 50 mg by mouth daily. Reported on 10/17/2015, Disp: , Rfl:  .  traZODone (DESYREL) 50 MG tablet, Take 25 mg by mouth at bedtime., Disp: , Rfl:  .  valACYclovir (VALTREX) 1000 MG tablet, Take 1 tablet by mouth every 12 (twelve) hours as needed (fever blisters). Reported on 10/17/2015, Disp: , Rfl:   Past Medical History: Past Medical History:  Diagnosis Date  . Aortic stenosis, mild   . Arthritis of hand    "just a  little bit in both hands" (03/31/2012)  . Asthma    "little bit" (03/31/2012)  . Atrial fibrillation [facility])    dx '04; DCCV '04, placed on flecainide, failed DCCV 04/2010, flecainide stopped; s/p successful A.fib ablation 01/31/12  . CHF (congestive heart failure) ([place])   . Diabetes mellitus without complication (HCC)    borderline  . DJD (degenerative joint disease)   . Exertional dyspnea   . Fibromyalgia   . 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 GERD (gastroesophageal reflux disease)   . Heart murmur   . Hyperlipidemia   . Hypertension   . Hypothyroidism    S/P radiation  . Left atrial enlargement    LA size 416mby echo 11/21/11  . Mitral regurgitation    trivial  . Obesity   . Obstructive sleep apnea    mild by sleep study 2013; pt stated he does not have a machine because "it wasn't bad enough for him to have one"  . Pacemaker 03/31/2012  . Restless leg syndrome   . Second degree AV block   . Visit for monitoring Tikosyn therapy 04/16/2016    Tobacco Use: History  Smoking Status  . Former Smoker  . Packs/day: 0.50  . Years: 15.00  . Types: Cigarettes  . Quit date: 07/18/1991  Smokeless Tobacco  . Never Used    Labs: Recent Review Flowsheet Data    Labs for ITP Cardiac and Pulmonary Rehab Latest Ref Rng & Units 04/17/2016 04/17/2016 04/17/2016 04/17/2016 04/17/2016   Cholestrol 125 - 200 mg/dL - - - - -   LDLCALC <130 mg/dL - - - - -   HDL >=40 mg/dL - - - - -   Trlycerides <150 mg/dL - - - - -   Hemoglobin A1c 4.8 - 5.6 % - - - - -   PHART 7.350 - 7.450 7.442 - - - 7.308(L)   PCO2ART 32.0 - 48.0 mmHg 44.1 - - - 38.5   HCO3 20.0 - 28.0 mmol/L 30.1(H) 28.1(H) 28.0 28.6(H) 19.3(L)   TCO2 0 - 100 mmol/L _0 ACIDBASEDEF 0.0 - 2.0 mmol/L - - - - 7.0(H)   O2SAT % 43.0 43.0 42.0 50.0 87.0      Capillary Blood Glucose: Lab Results  Component Value Date   GLUCAP 88 04/08/2016   GLUCAP 136 (H) 11/24/2015   GLUCAP 118 (H) 11/24/2015    GLUCAP 114 (H) 11/24/2015   GLUCAP 120 (H) 11/23/2015       POCT Glucose    Row Name 06/11/16 1518 06/18/16 1606 06/27/16 1529         POCT Blood Glucose   Pre-Exercise 140 mg/dL 150 mg/dL 100 mg/dL  pt given snacks before exercise  Post-Exercise 138 mg/dL 120 mg/dL 99 mg/dL  pt given snack after exercise        ADL UCSD:   Pulmonary Function Assessment:     Pulmonary Function Assessment - 05/27/16 1256      Breath   Bilateral Breath Sounds Clear;Wheezes  clear with one wheeze in right mid lobe   Shortness of Breath Limiting activity;Yes      Exercise Target Goals:    Exercise Program Goal: Individual exercise prescription set with THRR, safety & activity barriers. Participant demonstrates ability to understand and report RPE using BORG scale, to self-measure pulse accurately, and to acknowledge the importance of the exercise prescription.  Exercise Prescription Goal: Starting with aerobic activity 30 plus minutes a day, 3 days per week for initial exercise prescription. Provide home exercise prescription and guidelines that participant acknowledges understanding prior to discharge.  Activity Barriers & Risk Stratification:     Activity Barriers & Cardiac Risk Stratification - 05/27/16 1255      Activity Barriers & Cardiac Risk Stratification   Activity Barriers Back Problems      6 Minute Walk:     6 Minute Walk    Row Name 06/04/16 1633         6 Minute Walk   Phase Initial     Distance 1000 feet     Walk Time -  4 minutes 21 seconds     # of Rest Breaks 3  1 minute and 39 seconds total     MPH 1.89     METS 2.45     RPE 17     Perceived Dyspnea  3     Symptoms Yes (comment)  leg fatigue     Comments used wheelchair     Resting HR 78 bpm     Resting BP 140/62     Max Ex. HR 100 bpm     Max Ex. BP 144/64       Interval HR   Baseline HR 78     1 Minute HR 91     2 Minute HR 99     3 Minute HR 100     4 Minute HR 92     5 Minute  HR 98     6 Minute HR 100     2 Minute Post HR 89     Interval Heart Rate? Yes       Interval Oxygen   Interval Oxygen? Yes     Baseline Oxygen Saturation % 100 %     Baseline Liters of Oxygen 2 L     1 Minute Oxygen Saturation % 95 %     1 Minute Liters of Oxygen 2 L     2 Minute Oxygen Saturation % 94 %     2 Minute Liters of Oxygen 2 L     3 Minute Oxygen Saturation % 93 %     3 Minute Liters of Oxygen 2 L     4 Minute Oxygen Saturation % 91 %     4 Minute Liters of Oxygen 2 L     5 Minute Oxygen Saturation % 91 %     5 Minute Liters of Oxygen 2 L     6 Minute Oxygen Saturation % 92 %     6 Minute Liters of Oxygen 2 L     2 Minute Post Oxygen Saturation % 92 %     2 Minute Post Liters of Oxygen 2 L  Oxygen Initial Assessment:   Oxygen Re-Evaluation:   Oxygen Discharge (Final Oxygen Re-Evaluation):   Initial Exercise Prescription:     Initial Exercise Prescription - 06/04/16 1600      Date of Initial Exercise RX and Referring Provider   Date 06/04/16   Referring Provider Dr. Haroldine Laws     Oxygen   Oxygen Continuous   Liters 2     Bike   Level 0.5   Minutes 17     NuStep   Level 2   Minutes 17   METs 1.5     Track   Laps 5   Minutes 17     Prescription Details   Frequency (times per week) 2   Duration Progress to 45 minutes of aerobic exercise without signs/symptoms of physical distress     Intensity   THRR 40-80% of Max Heartrate 61-122   Ratings of Perceived Exertion 11-13   Perceived Dyspnea 0-4     Progression   Progression Continue progressive overload as per policy without signs/symptoms or physical distress.     Resistance Training   Training Prescription Yes   Weight blue bands   Reps 10-12      Perform Capillary Blood Glucose checks as needed.  Exercise Prescription Changes:     Exercise Prescription Changes    Row Name 06/11/16 1500 06/18/16 1552 06/25/16 1500 06/27/16 1500 07/02/16 1512     Response to Exercise    Blood Pressure (Admit) 130/70 124/62 134/56 122/70 130/80   Blood Pressure (Exercise) 152/74 104/70 126/66 140/70 144/60   Blood Pressure (Exit) 100/60 1_0 124/60   Heart Rate (Admit) 77 bpm 85 bpm 60 bpm 94 bpm 82 bpm   Heart Rate (Exercise) 93 bpm 79 bpm 77 bpm 82 bpm 95 bpm   Heart Rate (Exit) 72 bpm 82 bpm 65 bpm 61 bpm 63 bpm   Oxygen Saturation (Admit) 97 % 98 % 96 % 94 % 100 %   Oxygen Saturation (Exercise) 91 % 95 % 95 % 90 % 94 %   Oxygen Saturation (Exit) 98 % 99 % 100 % 100 % 100 %   Rating of Perceived Exertion (Exercise) _1 Perceived Dyspnea (Exercise) _2 Duration Progress to 45 minutes of aerobic exercise without signs/symptoms of physical distress Progress to 45 minutes of aerobic exercise without signs/symptoms of physical distress Progress to 45 minutes of aerobic exercise without signs/symptoms of physical distress Progress to 45 minutes of aerobic exercise without signs/symptoms of physical distress Progress to 45 minutes of aerobic exercise without signs/symptoms of physical distress   Intensity Other (comment)  40-80%of HRR Other (comment)  40-80%of HRR THRR unchanged THRR unchanged THRR unchanged     Progression   Progression Continue to progress workloads to maintain intensity without signs/symptoms of physical distress. Continue to progress workloads to maintain intensity without signs/symptoms of physical distress. Continue to progress workloads to maintain intensity without signs/symptoms of physical distress. Continue to progress workloads to maintain intensity without signs/symptoms of physical distress. Continue to progress workloads to maintain intensity without signs/symptoms of physical distress.     Resistance Training   Training Prescription _3    Weight _4    Reps 10-12  10 minutes of strength training 10-12  10 minutes of strength training 10-15 10-15  10-15   Time  -  - 10 Minutes 10 Minutes 10 Minutes  Interval Training   Interval Training Yes Yes No No No     Oxygen   Oxygen _0    Liters _1 Bike   Level 0.5 0.5 0.5 0.8 0.8   Minutes _2 NuStep   Level _3 - 3   Minutes 17 34 17  - 17   METs 1.7 1.4 1.7  - 1.8     Track   Laps 10 - _4 Minutes 17 - _5 Home Exercise Plan   Plans to continue exercise at  -  -  - Home (comment)  -   Frequency  -  -  - Add 3 additional days to program exercise sessions.  -      Exercise Comments:     Exercise Comments    Row Name 06/27/16 0803           Exercise Comments Home exercise completed          Exercise Goals and Review:   Exercise Goals Re-Evaluation :     Exercise Goals Re-Evaluation    Row Name 07/08/16 1156             Exercise Goal Re-Evaluation   Exercise Goals Review Increase Physical Activity;Increase Strenth and Stamina       Comments Patient is slowly progressing exercise intensities. Patient is deconditioned and has started off low and slow. Has attended 5 sessions. Home exercise has been discussed with he and his wife.        Expected Outcomes Patient will cont. to increase endurance and stamina through the exercises here at rehab. He will also practice his breathing techinques to become less short of breath on exertion.           Discharge Exercise Prescription (Final Exercise Prescription Changes):     Exercise Prescription Changes - 07/02/16 1512      Response to Exercise   Blood Pressure (Admit) 130/80   Blood Pressure (Exercise) 144/60   Blood Pressure (Exit) 124/60   Heart Rate (Admit) 82 bpm   Heart Rate (Exercise) 95 bpm   Heart Rate (Exit) 63 bpm   Oxygen Saturation (Admit) 100 %   Oxygen Saturation (Exercise) 94 %   Oxygen Saturation (Exit) 100 %   Rating of Perceived Exertion (Exercise) 13   Perceived Dyspnea (Exercise) 3    Duration Progress to 45 minutes of aerobic exercise without signs/symptoms of physical distress   Intensity THRR unchanged     Progression   Progression Continue to progress workloads to maintain intensity without signs/symptoms of physical distress.     Resistance Training   Training Prescription Yes   Weight blue bands   Reps 10-15   Time 10 Minutes     Interval Training   Interval Training No     Oxygen   Oxygen Continuous   Liters 2     Bike   Level 0.8   Minutes 17     NuStep   Level 3   Minutes 17   METs 1.8     Track   Laps 15   Minutes 17      Nutrition:  Target Goals: Understanding of nutrition guidelines, daily intake of sodium <1550m, cholesterol <2025m calories 30% from fat and 7% or less from saturated fats, daily to have 5 or more servings of fruits  and vegetables.  Biometrics:    Nutrition Therapy Plan and Nutrition Goals:   Nutrition Discharge: Rate Your Plate Scores:   Nutrition Goals Re-Evaluation:   Nutrition Goals Discharge (Final Nutrition Goals Re-Evaluation):   Psychosocial: Target Goals: Acknowledge presence or absence of significant depression and/or stress, maximize coping skills, provide positive support system. Participant is able to verbalize types and ability to use techniques and skills needed for reducing stress and depression.  Initial Review & Psychosocial Screening:     Initial Psych Review & Screening - 05/27/16 [place]? Yes     Barriers   Psychosocial barriers to participate in program There are no identifiable barriers or psychosocial needs.     Screening Interventions   Interventions Encouraged to exercise      Quality of Life Scores:   PHQ-9: Recent Review Flowsheet Data    Depression screen [facility] 2/9 05/27/2016 10/25/2015   Decreased Interest 0 0   Down, Depressed, Hopeless 0 0   PHQ - 2 Score 0 0     Interpretation of Total Score  Total Score Depression  Severity:  1-4 = Minimal depression, 5-9 = Mild depression, 10-14 = Moderate depression, 15-19 = Moderately severe depression, 20-27 = Severe depression   Psychosocial Evaluation and Intervention:     Psychosocial Evaluation - 05/27/16 1304      Psychosocial Evaluation & Interventions   Interventions Encouraged to exercise with the program and follow exercise prescription   Continue Psychosocial Services  No      Psychosocial Re-Evaluation:     Psychosocial Re-Evaluation    [place] Name 06/11/16 1554 07/04/16 0839           Psychosocial Re-Evaluation   Current issues with  - None Identified      Comments no psychosocial issues identified at this time  -      Interventions Encouraged to attend Pulmonary Rehabilitation for the exercise Encouraged to attend Pulmonary Rehabilitation for the exercise      Continue Psychosocial Services  No No Follow up required         Psychosocial Discharge (Final Psychosocial Re-Evaluation):     Psychosocial Re-Evaluation - 07/04/16 0839      Psychosocial Re-Evaluation   Current issues with None Identified   Interventions Encouraged to attend Pulmonary Rehabilitation for the exercise   Continue Psychosocial Services  No Follow up required      Education: Education Goals: Education classes will be provided on a weekly basis, covering required topics. Participant will state understanding/return demonstration of topics presented.  Learning Barriers/Preferences:     Learning Barriers/Preferences - 05/27/16 1256      Learning Barriers/Preferences   Learning Barriers None   Learning Preferences Written Material;Skilled Demonstration      Education Topics: Risk Factor Reduction:  -Group instruction that is supported by a PowerPoint presentation. Instructor discusses the definition of a risk factor, different risk factors for pulmonary disease, and how the heart and lungs work together.     Nutrition for Pulmonary Patient:  -Group  instruction provided by PowerPoint slides, verbal discussion, and written materials to support subject matter. The instructor gives an explanation and review of healthy diet recommendations, which includes a discussion on weight management, recommendations for fruit and vegetable consumption, as well as protein, fluid, caffeine, fiber, sodium, sugar, and alcohol. Tips for eating when patients are short of breath are discussed.   Pursed Lip Breathing:  -Group instruction that is supported by  demonstration and informational handouts. Instructor discusses the benefits of pursed lip and diaphragmatic breathing and detailed demonstration on how to preform both.     Oxygen Safety:  -Group instruction provided by PowerPoint, verbal discussion, and written material to support subject matter. There is an overview of "What is Oxygen" and "Why do we need it".  Instructor also reviews how to create a safe environment for oxygen use, the importance of using oxygen as prescribed, and the risks of noncompliance. There is a brief discussion on traveling with oxygen and resources the patient may utilize.   Oxygen Equipment:  -Group instruction provided by [facility] Staff utilizing handouts, written materials, and equipment demonstrations.   Signs and Symptoms:  -Group instruction provided by written material and verbal discussion to support subject matter. Warning signs and symptoms of infection, stroke, and heart attack are reviewed and when to call the physician/911 reinforced. Tips for preventing the spread of infection discussed.   Advanced Directives:  -Group instruction provided by verbal instruction and written material to support subject matter. Instructor reviews Advanced Directive laws and proper instruction for filling out document.   Pulmonary Video:  -Group video education that reviews the importance of medication and oxygen compliance, exercise, good nutrition, pulmonary hygiene, and pursed  lip and diaphragmatic breathing for the pulmonary patient.   Exercise for the Pulmonary Patient:  -Group instruction that is supported by a PowerPoint presentation. Instructor discusses benefits of exercise, core components of exercise, frequency, duration, and intensity of an exercise routine, importance of utilizing pulse oximetry during exercise, safety while exercising, and options of places to exercise outside of rehab.     Pulmonary Medications:  -Verbally interactive group education provided by instructor with focus on inhaled medications and proper administration.   Anatomy and Physiology of the Respiratory System and Intimacy:  -Group instruction provided by PowerPoint, verbal discussion, and written material to support subject matter. Instructor reviews respiratory cycle and anatomical components of the respiratory system and their functions. Instructor also reviews differences in obstructive and restrictive respiratory diseases with examples of each. Intimacy, Sex, and Sexuality differences are reviewed with a discussion on how relationships can change when diagnosed with pulmonary disease. Common sexual concerns are reviewed.   Knowledge Questionnaire Score:   Core Components/Risk Factors/Patient Goals at Admission:     Personal Goals and Risk Factors at Admission - 05/27/16 1300      Core Components/Risk Factors/Patient Goals on Admission   Increase Strength and Stamina Yes   Intervention Provide advice, education, support and counseling about physical activity/exercise needs.;Develop an individualized exercise prescription for aerobic and resistive training based on initial evaluation findings, risk stratification, comorbidities and participant's personal goals.   Expected Outcomes Achievement of increased cardiorespiratory fitness and enhanced flexibility, muscular endurance and strength shown through measurements of functional capacity and personal statement of participant.    Improve shortness of breath with ADL's Yes   Intervention Provide education, individualized exercise plan and daily activity instruction to help decrease symptoms of SOB with activities of daily living.   Expected Outcomes Short Term: Achieves a reduction of symptoms when performing activities of daily living.   Develop more efficient breathing techniques such as purse lipped breathing and diaphragmatic breathing; and practicing self-pacing with activity Yes   Intervention Provide education, demonstration and support about specific breathing techniuqes utilized for more efficient breathing. Include techniques such as pursed lipped breathing, diaphragmatic breathing and self-pacing activity.   Expected Outcomes Short Term: Participant will be able to demonstrate and use breathing  techniques as needed throughout daily activities.   Increase knowledge of respiratory medications and ability to use respiratory devices properly  Yes   Intervention Provide education and demonstration as needed of appropriate use of medications, inhalers, and oxygen therapy.   Expected Outcomes Short Term: Achieves understanding of medications use. Understands that oxygen is a medication prescribed by physician. Demonstrates appropriate use of inhaler and oxygen therapy.      Core Components/Risk Factors/Patient Goals Review:      Goals and Risk Factor Review    Row Name 05/27/16 1303 06/11/16 1550 06/13/16 0908 06/13/16 0910 07/04/16 0837     Core Components/Risk Factors/Patient Goals Review   Personal Goals Review Increase Strength and Stamina;Improve shortness of breath with ADL's;Develop more efficient breathing techniques such as purse lipped breathing and diaphragmatic breathing and practicing self-pacing with activity.  - Weight Management/Obesity;Increase Strength and Stamina;Improve shortness of breath with ADL's;Develop more efficient breathing techniques such as purse lipped breathing and diaphragmatic  breathing and practicing self-pacing with activity.  - Weight Management/Obesity;Improve shortness of breath with ADL's;Develop more efficient breathing techniques such as purse lipped breathing and diaphragmatic breathing and practicing self-pacing with activity.   Review  - Just started program, has only exercised once, too early to see progress  - Just started program, too early to see progress has attended 5 exercise sessions, has chronic back and leg pain which has slowed his progression, encouraged to seek help with the pain clinic   Expected Outcomes  - expect progress in the next 30 days  - expect progress in the next full 30 days expect slow progression, proceed slowly with workload increases      Core Components/Risk Factors/Patient Goals at Discharge (Final Review):      Goals and Risk Factor Review - 07/04/16 0837      Core Components/Risk Factors/Patient Goals Review   Personal Goals Review Weight Management/Obesity;Improve shortness of breath with ADL's;Develop more efficient breathing techniques such as purse lipped breathing and diaphragmatic breathing and practicing self-pacing with activity.   Review has attended 5 exercise sessions, has chronic back and leg pain which has slowed his progression, encouraged to seek help with the pain clinic   Expected Outcomes expect slow progression, proceed slowly with workload increases      ITP Comments:   Comments:

## 2016-07-09 NOTE — Progress Notes (Signed)
Daily Session Note  Patient Details  Name: [name] MRN: [id number] Date of Birth: 1950/07/10 Referring Provider:   April Manson Pulmonary Rehab Walk Test from 06/04/2016 in [place]  Referring Provider  Dr. Haroldine Laws      Encounter Date: 07/09/2016  Check In:     Session Check In - 07/09/16 1409      Check-In   Location [facility]   Staff Present Trish Fountain, RN, BSN;Molly diVincenzo, MS, ACSM RCEP, Exercise Physiologist   Supervising physician immediately available to respond to emergencies [facility] immediately available   Physician(s) Dr. Maylene Roes   Medication changes reported     No   Fall or balance concerns reported    No   Tobacco Cessation No Change   Warm-up and Cool-down Performed as group-led instruction   Resistance Training Performed Yes   VAD Patient? No     Pain Assessment   Currently in Pain? No/denies   Multiple Pain Sites No      Capillary Blood Glucose: No results found for this or any previous visit (from the past 24 hour(s)).     POCT Glucose - 07/09/16 1531      POCT Blood Glucose   Pre-Exercise 192 mg/dL   Post-Exercise 148 mg/dL         Exercise Prescription Changes - 07/09/16 1500      Response to Exercise   Blood Pressure (Admit) 130/70   Blood Pressure (Exercise) 138/62   Blood Pressure (Exit) 116/60   Heart Rate (Admit) 81 bpm   Heart Rate (Exercise) 95 bpm   Heart Rate (Exit) 71 bpm   Oxygen Saturation (Admit) 100 %   Oxygen Saturation (Exercise) 93 %   Oxygen Saturation (Exit) 99 %   Rating of Perceived Exertion (Exercise) 15   Perceived Dyspnea (Exercise) 2   Duration Progress to 45 minutes of aerobic exercise without signs/symptoms of physical distress   Intensity THRR unchanged     Progression   Progression Continue to progress workloads to maintain intensity without signs/symptoms of physical distress.     Resistance Training   Training Prescription Yes    Weight blue bands   Reps 10-15   Time 10 Minutes     Interval Training   Interval Training No     Oxygen   Oxygen Continuous   Liters 2     Bike   Level 0.8   Minutes 17     NuStep   Level 3   Minutes 17   METs 2.3     Track   Laps 13   Minutes 17      History  Smoking Status  . Former Smoker  . Packs/day: 0.50  . Years: 15.00  . Types: Cigarettes  . Quit date: 07/18/1991  Smokeless Tobacco  . Never Used    Goals Met:  Exercise tolerated well No report of cardiac concerns or symptoms Strength training completed today  Goals Unmet:  Not Applicable  Comments: Service time is from 1330 to 1515    Dr. Rush Farmer is Medical Director for Pulmonary Rehab at [facility].

## 2016-07-10 ENCOUNTER — Other Ambulatory Visit: Payer: Self-pay | Admitting: Internal Medicine

## 2016-07-11 ENCOUNTER — Encounter (HOSPITAL_COMMUNITY)
Admission: RE | Admit: 2016-07-11 | Discharge: 2016-07-11 | Disposition: A | Discharge status: Home / Self Care | Payer: Medicare Other | Source: Ambulatory Visit | Attending: Internal Medicine | Admitting: Internal Medicine

## 2016-07-11 ENCOUNTER — Inpatient Hospital Stay (HOSPITAL_COMMUNITY): Admission: RE | Admit: 2016-07-11 | Payer: Medicare Other | Source: Ambulatory Visit

## 2016-07-11 ENCOUNTER — Telehealth (HOSPITAL_COMMUNITY): Payer: Self-pay | Admitting: *Deleted

## 2016-07-11 VITALS — Wt 216.1 lb

## 2016-07-11 DIAGNOSIS — Z952 Presence of prosthetic heart valve: Secondary | ICD-10-CM

## 2016-07-11 DIAGNOSIS — I509 Heart failure, unspecified: Secondary | ICD-10-CM | POA: Diagnosis not present

## 2016-07-11 MED ORDER — METOPROLOL TARTRATE 50 MG PO TABS: 50.0000 mg | ORAL_TABLET | Freq: Two times a day (BID) | ORAL | 3 refills | Status: DC

## 2016-07-11 MED ORDER — TORSEMIDE 20 MG PO TABS: 40.0000 mg | ORAL_TABLET | Freq: Every day | ORAL | 3 refills | Status: DC

## 2016-07-11 NOTE — Progress Notes (Signed)
Daily Session Note  Patient Details  Name: [name] MRN: [id number] Date of Birth: [birth year] Referring Provider:     Pulmonary Rehab Walk Test from 06/04/2016 in [place]  Referring Provider  Dr. Haroldine Laws      Encounter Date: 07/11/2016  Check In:     Session Check In - 07/11/16 1355      Check-In   Location [facility]   Staff Present Su Hilt, MS, ACSM RCEP, Exercise Physiologist;Joan Leonia Reeves, RN, BSN;Lisa Hughes, RN;Portia Rollene Rotunda, RN, BSN   Supervising physician immediately available to respond to emergencies [facility] immediately available   Physician(s) Dr. Sloan Leiter   Medication changes reported     No   Fall or balance concerns reported    No   Tobacco Cessation No Change   Warm-up and Cool-down Performed as group-led instruction   Resistance Training Performed Yes   VAD Patient? No     Pain Assessment   Currently in Pain? No/denies   Multiple Pain Sites No      Capillary Blood Glucose: No results found for this or any previous visit (from the past 24 hour(s)).     POCT Glucose - 07/11/16 1625      POCT Blood Glucose   Pre-Exercise 200 mg/dL   Post-Exercise 139 mg/dL         Exercise Prescription Changes - 07/11/16 1600      Response to Exercise   Blood Pressure (Admit) 130/70   Blood Pressure (Exercise) 140/60   Blood Pressure (Exit) 110/58   Heart Rate (Admit) 72 bpm   Heart Rate (Exercise) 91 bpm   Heart Rate (Exit) 66 bpm   Oxygen Saturation (Admit) 100 %   Oxygen Saturation (Exercise) 93 %   Oxygen Saturation (Exit) 96 %   Rating of Perceived Exertion (Exercise) 13   Perceived Dyspnea (Exercise) 1   Duration Progress to 45 minutes of aerobic exercise without signs/symptoms of physical distress   Intensity THRR unchanged     Progression   Progression Continue to progress workloads to maintain intensity without signs/symptoms of physical distress.     Resistance Training    Training Prescription Yes   Weight blue bands   Reps 10-15   Time 10 Minutes     Interval Training   Interval Training No     Oxygen   Oxygen Continuous   Liters 2     NuStep   Level 4   Minutes 17   METs 2.3     Track   Laps 18   Minutes 17      History  Smoking Status  . Former Smoker  . Packs/day: 0.50  . Years: 15.00  . Types: Cigarettes  . Quit date: 07/18/1991  Smokeless Tobacco  . Never Used    Goals Met:  Exercise tolerated well Strength training completed today  Goals Unmet:  Not Applicable  Comments: Service time is from 1330 to [place]    Dr. Rush Farmer is Medical Director for Pulmonary Rehab at [facility].

## 2016-07-11 NOTE — Telephone Encounter (Signed)
Pt's wife left vm on triage line saying that she needed to reschedule patient's lab work that was suppose to be done today.  She said he only took one dose of metolazone and he had really bad cramps that he never took his second dose as prescribed.  She also asked for refills on metoprolol and torsemide.    I tried calling her back but had to leave a message asking for her to call us back so that we could discuss in more detail about the metolazone.  Refills sent as requested.

## 2016-07-11 NOTE — Telephone Encounter (Signed)
Pt's wife called back and I asked her more about the metolazone and she stated that he only took one dose and had really bad leg cramps even with the extra potassium and he refused to take it again.  His weight has come down to 213 lb from 218 lb last week. I advised her that if he does have weight gain or swelling/sob that he can take one dose of metolazone a week but he needs to take an extra potassium with it.  She understands and asked if he still needed to come in for lab work.  I told her he did and she had me reschedule his labs from today to next Tuesday.  No further questions at this time.

## 2016-07-16 ENCOUNTER — Telehealth (HOSPITAL_COMMUNITY): Payer: Self-pay | Admitting: *Deleted

## 2016-07-16 ENCOUNTER — Inpatient Hospital Stay (HOSPITAL_COMMUNITY): Admission: RE | Admit: 2016-07-16 | Payer: Medicare Other | Source: Ambulatory Visit

## 2016-07-16 ENCOUNTER — Encounter (HOSPITAL_COMMUNITY): Payer: Medicare Other

## 2016-07-16 NOTE — Telephone Encounter (Signed)
Pt wife called to let pulmonary rehab staff know that her husband [name] would be absent today. Pt had increased diuretics which resulted in leg pain and cramps.  Pt hoped to return to rehab on Thursday. Cherre Huger, BSN Cardiac and Training and development officer

## 2016-07-18 ENCOUNTER — Ambulatory Visit (HOSPITAL_COMMUNITY)
Admission: RE | Admit: 2016-07-18 | Discharge: 2016-07-18 | Disposition: A | Discharge status: Home / Self Care | Payer: Medicare Other | Source: Ambulatory Visit | Attending: Internal Medicine | Admitting: Internal Medicine

## 2016-07-18 ENCOUNTER — Encounter (HOSPITAL_COMMUNITY): Payer: Medicare Other

## 2016-07-18 ENCOUNTER — Telehealth (HOSPITAL_COMMUNITY): Payer: Self-pay | Admitting: Internal Medicine

## 2016-07-18 DIAGNOSIS — I5022 Chronic systolic (congestive) heart failure: Secondary | ICD-10-CM | POA: Insufficient documentation

## 2016-07-18 LAB — BASIC METABOLIC PANEL
Anion gap: 12 (ref 5–15)
BUN: 26 mg/dL — ABNORMAL HIGH (ref 6–20)
CO2: 32 mmol/L (ref 22–32)
Calcium: 9.9 mg/dL (ref 8.9–10.3)
Chloride: 91 mmol/L — ABNORMAL LOW (ref 101–111)
Creatinine, Ser: 1.88 mg/dL — ABNORMAL HIGH (ref 0.61–1.24)
GFR calc Af Amer: 41 mL/min — ABNORMAL LOW (ref >60)
GFR calc non Af Amer: 35 mL/min — ABNORMAL LOW (ref >60)
Glucose, Bld: 116 mg/dL — ABNORMAL HIGH (ref 65–99)
Potassium: 3.9 mmol/L (ref 3.5–5.1)
Sodium: 135 mmol/L (ref 135–145)

## 2016-07-23 ENCOUNTER — Encounter (HOSPITAL_COMMUNITY): Admission: RE | Admit: 2016-07-23 | Payer: Medicare Other | Source: Ambulatory Visit

## 2016-07-25 ENCOUNTER — Encounter (HOSPITAL_COMMUNITY): Admission: RE | Admit: 2016-07-25 | Payer: Medicare Other | Source: Ambulatory Visit

## 2016-07-30 ENCOUNTER — Encounter (HOSPITAL_COMMUNITY): Payer: Medicare Other

## 2016-08-01 ENCOUNTER — Encounter (HOSPITAL_COMMUNITY): Payer: Medicare Other

## 2016-08-05 NOTE — Progress Notes (Signed)
Discharge Summary  Patient Details  Name: [name] MRN: [id number] Date of Birth: Oct 26, 1950 Referring Provider:     Pulmonary Rehab Walk Test from 06/04/2016 in [place]  Referring Provider  Dr. Haroldine Laws       Number of Visits: 7  Reason for Discharge:  Early Exit:  Due to chronic and severe back and leg pain, unable to exercise comfortably  Smoking History:  History  Smoking Status  . Former Smoker  . Packs/day: 0.50  . Years: 15.00  . Types: Cigarettes  . Quit date: 07/18/1991  Smokeless Tobacco  . Never Used    Diagnosis:  Acute on chronic heart failure, unspecified heart failure type ([place])  S/P AVR  ADL UCSD:   Initial Exercise Prescription:     Initial Exercise Prescription - 06/04/16 1600      Date of Initial Exercise RX and Referring Provider   Date 06/04/16   Referring Provider Dr. Haroldine Laws     Oxygen   Oxygen Continuous   Liters 2     Bike   Level 0.5   Minutes 17     NuStep   Level 2   Minutes 17   METs 1.5     Track   Laps 5   Minutes 17     Prescription Details   Frequency (times per week) 2   Duration Progress to 45 minutes of aerobic exercise without signs/symptoms of physical distress     Intensity   THRR 40-80% of Max Heartrate 61-122   Ratings of Perceived Exertion 11-13   Perceived Dyspnea 0-4     Progression   Progression Continue progressive overload as per policy without signs/symptoms or physical distress.     Resistance Training   Training Prescription Yes   Weight blue bands   Reps 10-12      Discharge Exercise Prescription (Final Exercise Prescription Changes):     Exercise Prescription Changes - 07/11/16 1600      Response to Exercise   Blood Pressure (Admit) 130/70   Blood Pressure (Exercise) 140/60   Blood Pressure (Exit) 110/58   Heart Rate (Admit) 72 bpm   Heart Rate (Exercise) 91 bpm   Heart Rate (Exit) 66 bpm   Oxygen Saturation (Admit) 100 %   Oxygen  Saturation (Exercise) 93 %   Oxygen Saturation (Exit) 96 %   Rating of Perceived Exertion (Exercise) 13   Perceived Dyspnea (Exercise) 1   Duration Progress to 45 minutes of aerobic exercise without signs/symptoms of physical distress   Intensity THRR unchanged     Progression   Progression Continue to progress workloads to maintain intensity without signs/symptoms of physical distress.     Resistance Training   Training Prescription Yes   Weight blue bands   Reps 10-15   Time 10 Minutes     Interval Training   Interval Training No     Oxygen   Oxygen Continuous   Liters 2     NuStep   Level 4   Minutes 17   METs 2.3     Track   Laps 18   Minutes 17      Functional Capacity:     6 Minute Walk    Row Name 06/04/16 1633         6 Minute Walk   Phase Initial     Distance 1000 feet     Walk Time -  4 minutes 21 seconds     # of  Rest Breaks 3  1 minute and 39 seconds total     MPH 1.89     METS 2.45     RPE 17     Perceived Dyspnea  3     Symptoms Yes (comment)  leg fatigue     Comments used wheelchair     Resting HR 78 bpm     Resting BP 140/62     Max Ex. HR 100 bpm     Max Ex. BP 144/64       Interval HR   Baseline HR 78     1 Minute HR 91     2 Minute HR 99     3 Minute HR 100     4 Minute HR 92     5 Minute HR 98     6 Minute HR 100     2 Minute Post HR 89     Interval Heart Rate? Yes       Interval Oxygen   Interval Oxygen? Yes     Baseline Oxygen Saturation % 100 %     Baseline Liters of Oxygen 2 L     1 Minute Oxygen Saturation % 95 %     1 Minute Liters of Oxygen 2 L     2 Minute Oxygen Saturation % 94 %     2 Minute Liters of Oxygen 2 L     3 Minute Oxygen Saturation % 93 %     3 Minute Liters of Oxygen 2 L     4 Minute Oxygen Saturation % 91 %     4 Minute Liters of Oxygen 2 L     5 Minute Oxygen Saturation % 91 %     5 Minute Liters of Oxygen 2 L     6 Minute Oxygen Saturation % 92 %     6 Minute Liters of Oxygen 2 L     2  Minute Post Oxygen Saturation % 92 %     2 Minute Post Liters of Oxygen 2 L        Psychological, QOL, Others - Outcomes: PHQ 2/9: Depression screen [facility] 2/9 05/27/2016 10/25/2015  Decreased Interest 0 0  Down, Depressed, Hopeless 0 0  PHQ - 2 Score 0 0  Some recent data might be hidden    Quality of Life:   Personal Goals: Goals established at orientation with interventions provided to work toward goal.     Personal Goals and Risk Factors at Admission - 05/27/16 1300      Core Components/Risk Factors/Patient Goals on Admission   Increase Strength and Stamina Yes   Intervention Provide advice, education, support and counseling about physical activity/exercise needs.;Develop an individualized exercise prescription for aerobic and resistive training based on initial evaluation findings, risk stratification, comorbidities and participant's personal goals.   Expected Outcomes Achievement of increased cardiorespiratory fitness and enhanced flexibility, muscular endurance and strength shown through measurements of functional capacity and personal statement of participant.   Improve shortness of breath with ADL's Yes   Intervention Provide education, individualized exercise plan and daily activity instruction to help decrease symptoms of SOB with activities of daily living.   Expected Outcomes Short Term: Achieves a reduction of symptoms when performing activities of daily living.   Develop more efficient breathing techniques such as purse lipped breathing and diaphragmatic breathing; and practicing self-pacing with activity Yes   Intervention Provide education, demonstration and support about specific breathing techniuqes utilized for more efficient breathing. Include  techniques such as pursed lipped breathing, diaphragmatic breathing and self-pacing activity.   Expected Outcomes Short Term: Participant will be able to demonstrate and use breathing techniques as needed throughout daily  activities.   Increase knowledge of respiratory medications and ability to use respiratory devices properly  Yes   Intervention Provide education and demonstration as needed of appropriate use of medications, inhalers, and oxygen therapy.   Expected Outcomes Short Term: Achieves understanding of medications use. Understands that oxygen is a medication prescribed by physician. Demonstrates appropriate use of inhaler and oxygen therapy.       Personal Goals Discharge:     Goals and Risk Factor Review    Row Name 05/27/16 1303 06/11/16 1550 06/13/16 0908 06/13/16 0910 07/04/16 0837     Core Components/Risk Factors/Patient Goals Review   Personal Goals Review Increase Strength and Stamina;Improve shortness of breath with ADL's;Develop more efficient breathing techniques such as purse lipped breathing and diaphragmatic breathing and practicing self-pacing with activity.  - Weight Management/Obesity;Increase Strength and Stamina;Improve shortness of breath with ADL's;Develop more efficient breathing techniques such as purse lipped breathing and diaphragmatic breathing and practicing self-pacing with activity.  - Weight Management/Obesity;Improve shortness of breath with ADL's;Develop more efficient breathing techniques such as purse lipped breathing and diaphragmatic breathing and practicing self-pacing with activity.   Review  - Just started program, has only exercised once, too early to see progress  - Just started program, too early to see progress has attended 5 exercise sessions, has chronic back and leg pain which has slowed his progression, encouraged to seek help with the pain clinic   Expected Outcomes  - expect progress in the next 30 days  - expect progress in the next full 30 days expect slow progression, proceed slowly with workload increases      Nutrition & Weight - Outcomes:    Nutrition:   Nutrition Discharge:   Education Questionnaire Score:   Goals reviewed with patient;  copy given to patient.

## 2016-08-05 NOTE — Addendum Note (Signed)
Encounter addended by: Lance Morin, RN on: 08/05/2016 11:29 AM<BR>    Actions taken: Sign clinical note, Episode resolved

## 2016-08-06 ENCOUNTER — Encounter (HOSPITAL_COMMUNITY): Payer: Medicare Other

## 2016-08-08 ENCOUNTER — Encounter (HOSPITAL_COMMUNITY): Payer: Medicare Other

## 2016-08-13 ENCOUNTER — Encounter (HOSPITAL_COMMUNITY): Payer: Medicare Other

## 2016-08-15 ENCOUNTER — Encounter (HOSPITAL_COMMUNITY): Payer: Medicare Other

## 2016-08-19 ENCOUNTER — Ambulatory Visit (HOSPITAL_COMMUNITY)
Admission: RE | Admit: 2016-08-19 | Discharge: 2016-08-19 | Disposition: A | Discharge status: Home / Self Care | Payer: Medicare Other | Source: Ambulatory Visit | Attending: Nurse Practitioner | Admitting: Nurse Practitioner

## 2016-08-19 ENCOUNTER — Ambulatory Visit (INDEPENDENT_AMBULATORY_CARE_PROVIDER_SITE_OTHER): Payer: Medicare Other | Admitting: *Deleted

## 2016-08-19 ENCOUNTER — Encounter (HOSPITAL_COMMUNITY): Payer: Self-pay | Admitting: Nurse Practitioner

## 2016-08-19 VITALS — BP 138/68 | HR 62 | Ht 71.0 in | Wt 211.0 lb

## 2016-08-19 DIAGNOSIS — Z823 Family history of stroke: Secondary | ICD-10-CM | POA: Insufficient documentation

## 2016-08-19 DIAGNOSIS — I481 Persistent atrial fibrillation: Secondary | ICD-10-CM

## 2016-08-19 DIAGNOSIS — G4733 Obstructive sleep apnea (adult) (pediatric): Secondary | ICD-10-CM | POA: Insufficient documentation

## 2016-08-19 DIAGNOSIS — E119 Type 2 diabetes mellitus without complications: Secondary | ICD-10-CM | POA: Diagnosis not present

## 2016-08-19 DIAGNOSIS — E039 Hypothyroidism, unspecified: Secondary | ICD-10-CM | POA: Diagnosis not present

## 2016-08-19 DIAGNOSIS — M797 Fibromyalgia: Secondary | ICD-10-CM | POA: Insufficient documentation

## 2016-08-19 DIAGNOSIS — I509 Heart failure, unspecified: Secondary | ICD-10-CM | POA: Diagnosis not present

## 2016-08-19 DIAGNOSIS — J45909 Unspecified asthma, uncomplicated: Secondary | ICD-10-CM | POA: Insufficient documentation

## 2016-08-19 DIAGNOSIS — Z881 Allergy status to other antibiotic agents status: Secondary | ICD-10-CM | POA: Diagnosis not present

## 2016-08-19 DIAGNOSIS — Z87891 Personal history of nicotine dependence: Secondary | ICD-10-CM | POA: Diagnosis not present

## 2016-08-19 DIAGNOSIS — Z888 Allergy status to other drugs, medicaments and biological substances status: Secondary | ICD-10-CM | POA: Diagnosis not present

## 2016-08-19 DIAGNOSIS — Z8249 Family history of ischemic heart disease and other diseases of the circulatory system: Secondary | ICD-10-CM | POA: Insufficient documentation

## 2016-08-19 DIAGNOSIS — G2581 Restless legs syndrome: Secondary | ICD-10-CM | POA: Diagnosis not present

## 2016-08-19 DIAGNOSIS — Z955 Presence of coronary angioplasty implant and graft: Secondary | ICD-10-CM | POA: Insufficient documentation

## 2016-08-19 DIAGNOSIS — I441 Atrioventricular block, second degree: Secondary | ICD-10-CM

## 2016-08-19 DIAGNOSIS — I110 Hypertensive heart disease with heart failure: Secondary | ICD-10-CM | POA: Insufficient documentation

## 2016-08-19 DIAGNOSIS — I340 Nonrheumatic mitral (valve) insufficiency: Secondary | ICD-10-CM | POA: Insufficient documentation

## 2016-08-19 DIAGNOSIS — Z923 Personal history of irradiation: Secondary | ICD-10-CM | POA: Insufficient documentation

## 2016-08-19 DIAGNOSIS — I4819 Other persistent atrial fibrillation: Secondary | ICD-10-CM

## 2016-08-19 DIAGNOSIS — Z9889 Other specified postprocedural states: Secondary | ICD-10-CM | POA: Diagnosis not present

## 2016-08-19 DIAGNOSIS — Z952 Presence of prosthetic heart valve: Secondary | ICD-10-CM | POA: Insufficient documentation

## 2016-08-19 DIAGNOSIS — I350 Nonrheumatic aortic (valve) stenosis: Secondary | ICD-10-CM | POA: Diagnosis not present

## 2016-08-19 DIAGNOSIS — I4891 Unspecified atrial fibrillation: Secondary | ICD-10-CM | POA: Insufficient documentation

## 2016-08-19 DIAGNOSIS — E669 Obesity, unspecified: Secondary | ICD-10-CM | POA: Insufficient documentation

## 2016-08-19 DIAGNOSIS — Z7901 Long term (current) use of anticoagulants: Secondary | ICD-10-CM | POA: Insufficient documentation

## 2016-08-19 DIAGNOSIS — E785 Hyperlipidemia, unspecified: Secondary | ICD-10-CM | POA: Insufficient documentation

## 2016-08-19 DIAGNOSIS — K219 Gastro-esophageal reflux disease without esophagitis: Secondary | ICD-10-CM | POA: Insufficient documentation

## 2016-08-19 DIAGNOSIS — Z950 Presence of cardiac pacemaker: Secondary | ICD-10-CM | POA: Diagnosis not present

## 2016-08-19 DIAGNOSIS — Z6829 Body mass index (BMI) 29.0-29.9, adult: Secondary | ICD-10-CM | POA: Diagnosis not present

## 2016-08-19 LAB — BASIC METABOLIC PANEL
Anion gap: 7 (ref 5–15)
BUN: 19 mg/dL (ref 6–20)
CO2: 29 mmol/L (ref 22–32)
Calcium: 9.1 mg/dL (ref 8.9–10.3)
Chloride: 100 mmol/L — ABNORMAL LOW (ref 101–111)
Creatinine, Ser: 1.71 mg/dL — ABNORMAL HIGH (ref 0.61–1.24)
GFR calc Af Amer: 46 mL/min — ABNORMAL LOW (ref >60)
GFR calc non Af Amer: 40 mL/min — ABNORMAL LOW (ref >60)
Glucose, Bld: 93 mg/dL (ref 65–99)
Potassium: 4.3 mmol/L (ref 3.5–5.1)
Sodium: 136 mmol/L (ref 135–145)

## 2016-08-19 LAB — MAGNESIUM: Magnesium: 2.3 mg/dL (ref 1.7–2.4)

## 2016-08-19 NOTE — Progress Notes (Signed)
Remote pacemaker transmission.   

## 2016-08-19 NOTE — Progress Notes (Signed)
Primary Care Physician: Velna Hatchet, MD Referring Physician: [facility] f/u EP: Dr. Rayann Heman Heart Failure cardiologist: Dr. Virgina Jock is a 65 y.o. male with atrial fibrillation, h/o chronic lung disease, pulmonary HTN, chronic CHF(diastolic), DM, HTN, AVR with maze procedure, LAA clipping,/08/22/15 and 2 previous ablations. He was recently in the hospital for tikosyn loading and is in the afib clinic for f/u. He is in a paced rhythm today with qtc of 503 ms, shorter than on observed EKG's in the hospital. 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 He does not feel any better in SR. Being compliant with tikosyn.  F/u in the afib clinic 4/23, and is doing well staying in [place], but still says that staying in [place] did not make him feel any better.  He is being compliant with dofetilide. Continues on apixaban, no bleeding issues.  Today, he denies symptoms of palpitations, chest pain, shortness of breath, orthopnea, PND, lower extremity edema, dizziness, presyncope, syncope, or neurologic sequela. The patient is tolerating medications without difficulties and is otherwise without complaint today.   Past Medical History:  Diagnosis Date  . Aortic stenosis, mild   . Arthritis of hand    "just a little bit in both hands" (03/31/2012)  . Asthma    "little bit" (03/31/2012)  . Atrial fibrillation [facility])    dx '04; DCCV '04, placed on flecainide, failed DCCV 04/2010, flecainide stopped; s/p successful A.fib ablation 01/31/12  . CHF (congestive heart failure) ([place])   . Diabetes mellitus without complication (HCC)    borderline  . DJD (degenerative joint disease)   . Exertional dyspnea   . Fibromyalgia   . First degree AV block    post ablation, ~419ms  . GERD (gastroesophageal reflux disease)   . Heart murmur   . Hyperlipidemia   . Hypertension   . Hypothyroidism    S/P radiation  . Left atrial enlargement    LA size 36mm by echo 11/21/11  . Mitral regurgitation    trivial  . Obesity   . Obstructive  sleep apnea    mild by sleep study 2013; pt stated he does not have a machine because "it wasn't bad enough for him to have one"  . Pacemaker 03/31/2012  . Restless leg syndrome   . Second degree AV block   . Visit for monitoring Tikosyn therapy 04/16/2016   Past Surgical History:  Procedure Laterality Date  . AORTIC VALVE REPLACEMENT N/A 08/22/2015   Procedure: AORTIC VALVE REPLACEMENT (AVR);  Surgeon: Ivin Poot, MD;  Location: [place];  Service: Open Heart Surgery;  Laterality: N/A;  . ATRIAL FIBRILLATION ABLATION  01/30/2012   PVI by Dr. Rayann Heman  . ATRIAL FIBRILLATION ABLATION N/A 01/31/2012   Procedure: ATRIAL FIBRILLATION ABLATION;  Surgeon: Thompson Grayer, MD;  Location: [facility] CATH LAB;  Service: Cardiovascular;  Laterality: N/A;  . BACK SURGERY     X 3  . CARDIAC CATHETERIZATION N/A 08/09/2015   Procedure: Right/Left Heart Cath and Coronary Angiography;  Surgeon: Peter M Martinique, MD;  Location: [place] CV LAB;  Service: Cardiovascular;  Laterality: N/A;  . CARDIAC CATHETERIZATION N/A 02/05/2016   Procedure: Right/Left Heart Cath and Coronary Angiography;  Surgeon: Jettie Booze, MD;  Location: [place] CV LAB;  Service: Cardiovascular;  Laterality: N/A;  . CARDIAC CATHETERIZATION N/A 04/17/2016   Procedure: Right Heart Cath;  Surgeon: Jolaine Artist, MD;  Location: [place] CV LAB;  Service: Cardiovascular;  Laterality: N/A;  . CARDIOVASCULAR STRESS TEST  03/12/2002   EF 48%, NO EVIDENCE OF ISCHEMIA  . CARDIOVERSION  01/2012; 03/31/2012  . CARDIOVERSION N/A 02/25/2012   Procedure: CARDIOVERSION;  Surgeon: Thompson Grayer, MD;  Location: [facility] CATH LAB;  Service: Cardiovascular;  Laterality: N/A;  . CARDIOVERSION N/A 03/31/2012   Procedure: CARDIOVERSION;  Surgeon: Thompson Grayer, MD;  Location: [facility] CATH LAB;  Service: Cardiovascular;  Laterality: N/A;  . CARDIOVERSION N/A 11/23/2015   Procedure: CARDIOVERSION;  Surgeon: Larey Dresser, MD;  Location: [facility] ENDOSCOPY;  Service:  Cardiovascular;  Laterality: N/A;  . CARDIOVERSION N/A 04/08/2016   Procedure: CARDIOVERSION;  Surgeon: Jolaine Artist, MD;  Location: [facility] ENDOSCOPY;  Service: Cardiovascular;  Laterality: N/A;  . CLIPPING OF ATRIAL APPENDAGE N/A 08/22/2015   Procedure: CLIPPING OF ATRIAL APPENDAGE;  Surgeon: Ivin Poot, MD;  Location: [place];  Service: Open Heart Surgery;  Laterality: N/A;  . DOPPLER ECHOCARDIOGRAPHY  03/11/2002   EF 70-75%  . FINGER SURGERY Left    Middle finger  . FINGER TENDON REPAIR  1980's   "right little finger" (03/31/2012)  . INSERT / REPLACE / REMOVE PACEMAKER     [employer]  . LEFT AND RIGHT HEART CATHETERIZATION WITH CORONARY ANGIOGRAM N/A 10/27/2012   Procedure: LEFT AND RIGHT HEART CATHETERIZATION WITH CORONARY ANGIOGRAM;  Surgeon: Laverda Page, MD;  Location: [facility] CATH LAB;  Service: Cardiovascular;  Laterality: N/A;  . LUMBAR DISC SURGERY  1980's X2;  2000's  . MAZE N/A 08/22/2015   Procedure: MAZE;  Surgeon: Ivin Poot, MD;  Location: [place];  Service: Open Heart Surgery;  Laterality: N/A;  . PACEMAKER INSERTION  03/31/2012   STJ Accent DR pacemaker implanted by Dr Rayann Heman  . PERMANENT PACEMAKER INSERTION N/A 03/31/2012   Procedure: PERMANENT PACEMAKER INSERTION;  Surgeon: Thompson Grayer, MD;  Location: [facility] CATH LAB;  Service: Cardiovascular;  Laterality: N/A;  . TEE WITHOUT CARDIOVERSION  01/30/2012   Procedure: TRANSESOPHAGEAL ECHOCARDIOGRAM (TEE);  Surgeon: Larey Dresser, MD;  Location: [place];  Service: Cardiovascular;  Laterality: N/A;  ablation next day  . TEE WITHOUT CARDIOVERSION N/A 08/22/2015   Procedure: TRANSESOPHAGEAL ECHOCARDIOGRAM (TEE);  Surgeon: Ivin Poot, MD;  Location: [place];  Service: Open Heart Surgery;  Laterality: N/A;  . US ECHOCARDIOGRAPHY  08/07/2009   EF 55-60%    Current Outpatient Prescriptions  Medication Sig Dispense Refill  . albuterol (PROVENTIL HFA;VENTOLIN HFA) 108 (90 Base) MCG/ACT inhaler Inhale 2 puffs into the lungs  every 6 (six) hours as needed for wheezing or shortness of breath. 1 Inhaler 5  . amLODipine (NORVASC) 10 MG tablet Take 0.5 tablets (5 mg total) by mouth daily. 30 tablet 11  . apixaban (ELIQUIS) 5 MG TABS tablet Take 1 tablet (5 mg total) by mouth 2 (two) times daily. 180 tablet 3  . budesonide-formoterol (SYMBICORT) 160-4.5 MCG/ACT inhaler Inhale 2 puffs into the lungs 2 (two) times daily. 1 Inhaler 6  . Cholecalciferol (VITAMIN D) 2000 UNITS CAPS Take 2,000 Units by mouth daily.    [name] dofetilide (TIKOSYN) 250 MCG capsule Take 1 capsule (250 mcg total) by mouth 2 (two) times daily. 60 capsule 3  . fexofenadine (ALLEGRA) 180 MG tablet Take 1 tablet (180 mg total) by mouth daily. 30 tablet 1  . fluticasone (FLONASE) 50 MCG/ACT nasal spray Place into both nostrils 2 (two) times daily as needed for allergies or rhinitis.    [name] gabapentin (NEURONTIN) 300 MG capsule Take 900 mg by mouth at bedtime.   3  . guaiFENesin ([place])  600 MG 12 hr tablet Take 600 mg by mouth 2 (two) times daily.    [name] levalbuterol (XOPENEX) 0.63 MG/3ML nebulizer solution Take 3 mLs (0.63 mg total) by nebulization every 6 (six) hours as needed for wheezing or shortness of breath. 360 mL 1  . levothyroxine (SYNTHROID, LEVOTHROID) 175 MCG tablet Take 175 mcg by mouth daily before breakfast.    . metFORMIN (GLUMETZA) 500 MG (MOD) 24 hr tablet Take 500 mg by mouth daily with breakfast.    . metoprolol (LOPRESSOR) 50 MG tablet Take 1 tablet (50 mg total) by mouth 2 (two) times daily. 180 tablet 3  . pantoprazole (PROTONIX) 40 MG tablet Take 40 mg by mouth daily.    . potassium chloride (K-DUR) 10 MEQ tablet Take 2 tablets (20 mEq total) by mouth 2 (two) times daily. Take an extra 2 tabs when you take Metolazone 360 tablet 3  . promethazine (PHENERGAN) 25 MG tablet Take 25 mg by mouth every 6 (six) hours as needed for nausea or vomiting.    [name] rOPINIRole (REQUIP) 1 MG tablet Take 1 tablet (1 mg total) by mouth daily. 90 tablet 2  .  rosuvastatin (CRESTOR) 10 MG tablet     . spironolactone (ALDACTONE) 25 MG tablet Take 1 tablet (25 mg total) by mouth daily. 90 tablet 3  . torsemide (DEMADEX) 20 MG tablet Take 2 tablets (40 mg total) by mouth daily. 180 tablet 3  . traMADol (ULTRAM) 50 MG tablet Take 50 mg by mouth daily. Reported on 10/17/2015    . traZODone (DESYREL) 50 MG tablet Take 25 mg by mouth at bedtime.    . valACYclovir (VALTREX) 1000 MG tablet Take 1 tablet by mouth every 12 (twelve) hours as needed (fever blisters). Reported on 10/17/2015    . metolazone (ZAROXOLYN) 2.5 MG tablet Take 1 tablet (2.5 mg total) by mouth as needed. Do not take more than once a week (Patient not taking: Reported on 08/19/2016) 5 tablet 3   No current facility-administered medications for this encounter.     Allergies  Allergen Reactions  . Meloxicam Rash  . 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 syndrome 09/02/15, resolved with diphenhydramine and slowing of rate    Social History   Social History  . Marital status: Married    Spouse name: N/A  . Number of children: N/A  . Years of education: N/A   Occupational History  . Not on file.   Social History Main Topics  . Smoking status: Former Smoker    Packs/day: 0.50    Years: 15.00    Types: Cigarettes    Quit date: 07/18/1991  . Smokeless tobacco: Never Used  . Alcohol use Yes     Comment: occasional  . Drug use: Yes    Types: Marijuana  . Sexual activity: Not Currently   Other Topics Concern  . Not on file   Social History Narrative   Lives in [place] with spouse.  Works as a Air traffic controller.    Family History  Problem Relation Age of Onset  . Heart failure Mother   . Stroke Father     ROS- All systems are reviewed and negative except as per the HPI above  Physical Exam: Vitals:   08/19/16 1138  BP: 138/68  Pulse: 62  Weight: 211 lb (95.7 kg)  Height: 5\' 11"  (1.803 m)   Wt Readings from Last 3 Encounters:  08/19/16 211 lb (95.7  kg)  07/11/16 216 lb 0.8  oz (98 kg)  07/09/16 218 lb 0.6 oz (98.9 kg)    Labs: Lab Results  Component Value Date   NA 136 08/19/2016   K 4.3 08/19/2016   CL 100 (L) 08/19/2016   CO2 29 08/19/2016   GLUCOSE 93 08/19/2016   BUN 19 08/19/2016   CREATININE 1.71 (H) 08/19/2016   CALCIUM 9.1 08/19/2016   MG 2.3 08/19/2016   Lab Results  Component Value Date   INR 1.5 (H) 02/02/2016   Lab Results  Component Value Date   CHOL 114 (L) 08/03/2015   HDL 30 (L) 08/03/2015   LDLCALC 62 08/03/2015   TRIG 111 08/03/2015     GEN- The patient is well appearing, alert and oriented x 3 today.   Head- normocephalic, atraumatic Eyes-  Sclera clear, conjunctiva pink Ears- hearing intact Oropharynx- clear Neck- supple, no JVP Lymph- no cervical lymphadenopathy Lungs- Clear to ausculation bilaterally, normal work of breathing Heart- Regular rate and rhythm, no murmurs, rubs or gallops, PMI not laterally displaced GI- soft, NT, ND, + BS Extremities- no clubbing, cyanosis, or edema MS- no significant deformity or atrophy Skin- no rash or lesion Psych- euthymic mood, full affect Neuro- strength and sensation are intact  EKG-atrial sensed and ventricular paced at 62 bpm, pr int 198 ms, qrs int 192 ms, qtc 550 ms (typically runs a long qt 2/2 paced rhythm) Epic records reviewed   Assessment and Plan: 1. Afib S/p tikosyn loading In rhythm but does not feel better Continue tikosyn 250 mg bid Continue eliquis 5 mg bid Bmet/mag today  2. CHF Fluid status/weight stable Wanting a Kt refill, sent request to HF since he finished his K+ before rx due  afib  clinc as needed F/U HF clinic 5/31 F/u Dr. Rayann Heman 04/2017  Geroge Baseman. [place], [facility] [address] [place], [place] [address] [phone]

## 2016-08-20 ENCOUNTER — Encounter (HOSPITAL_COMMUNITY): Payer: Medicare Other

## 2016-08-20 ENCOUNTER — Telehealth: Payer: Self-pay | Admitting: Internal Medicine

## 2016-08-20 NOTE — Telephone Encounter (Signed)
New message      Do you have his new prescription of Tikosyn ? He is about out and has not gotten new prescription

## 2016-08-20 NOTE — Telephone Encounter (Signed)
The pts wife states that she will come to the office on Thursday 4/26 to fill out or pick up paperwork for Pt assistance for Tikosyn. I have left the paperwork in the front office.  The pts wife is aware that it may take up to 4 weeks to hear back from [employer] pt assistance program. She verbalized understanding.

## 2016-08-20 NOTE — Telephone Encounter (Signed)
Patient gets his Tikosyn from patient assistance program.  He is on his last bottle and has 2 1/2 weeks left.  Can you order and let his wife know when medication will be here

## 2016-08-21 NOTE — Telephone Encounter (Signed)
Follow up    Pt wife is calling about his tikosyn. She is requesting a call back.

## 2016-08-22 ENCOUNTER — Encounter: Payer: Self-pay | Admitting: Cardiology

## 2016-08-22 ENCOUNTER — Encounter (HOSPITAL_COMMUNITY): Payer: Medicare Other

## 2016-08-22 LAB — CUP PACEART REMOTE DEVICE CHECK
Battery Remaining Longevity: 64 mo
Battery Remaining Percentage: 57 %
Battery Voltage: 2.89 V
Brady Statistic AP VP Percent: 31 %
Brady Statistic AP VS Percent: 1 %
Brady Statistic AS VP Percent: 67 %
Brady Statistic AS VS Percent: 1 %
Brady Statistic RA Percent Paced: 31 %
Brady Statistic RV Percent Paced: 99 %
Date Time Interrogation Session: 20180423060528
Implantable Lead Implant Date: 20131203
Implantable Lead Implant Date: 20131203
Implantable Lead Location: 753859
Implantable Lead Location: 753860
Implantable Lead Model: 1948
Implantable Pulse Generator Implant Date: 20131203
Lead Channel Impedance Value: 380 Ohm
Lead Channel Impedance Value: 580 Ohm
Lead Channel Pacing Threshold Amplitude: 0.875 V
Lead Channel Pacing Threshold Amplitude: 0.875 V
Lead Channel Pacing Threshold Pulse Width: 0.5 ms
Lead Channel Pacing Threshold Pulse Width: 0.8 ms
Lead Channel Sensing Intrinsic Amplitude: 3.9 mV
Lead Channel Sensing Intrinsic Amplitude: 9 mV
Lead Channel Setting Pacing Amplitude: 1.125
Lead Channel Setting Pacing Amplitude: 1.875
Lead Channel Setting Pacing Pulse Width: 0.8 ms
Lead Channel Setting Sensing Sensitivity: 4 mV
Pulse Gen Model: 2210
Pulse Gen Serial Number: 7408461

## 2016-08-22 MED ORDER — DOFETILIDE 250 MCG PO CAPS: 250.0000 ug | ORAL_CAPSULE | Freq: Two times a day (BID) | ORAL | 2 refills | Status: DC

## 2016-08-22 NOTE — Telephone Encounter (Signed)
The pts wife states that she spoke with someone at [employer] who has advised her that the pts Tikosyn is approved through [employer] for pt assistance until December of this year and that all they need is a prescription. The pts wife is advised that I will fax a signed RX for Tikosyn to [facility] and that if they have anymore question or concerns to call me back. She verbalized understanding and thanked me for my assistance.

## 2016-08-22 NOTE — Telephone Encounter (Signed)
F/U Call:  Patient calling for update on Tikosyn. Thanks.

## 2016-08-27 ENCOUNTER — Encounter (HOSPITAL_COMMUNITY): Payer: Medicare Other

## 2016-08-29 ENCOUNTER — Encounter (HOSPITAL_COMMUNITY): Payer: Medicare Other

## 2016-09-02 ENCOUNTER — Other Ambulatory Visit: Payer: Self-pay

## 2016-09-02 ENCOUNTER — Telehealth: Payer: Self-pay | Admitting: Internal Medicine

## 2016-09-02 MED ORDER — DOFETILIDE 250 MCG PO CAPS: 250.0000 ug | ORAL_CAPSULE | Freq: Two times a day (BID) | ORAL | 3 refills | Status: DC

## 2016-09-02 NOTE — Telephone Encounter (Signed)
Stacy, the nurse at our A-fib clinic states that they do have Tikosyn samples and that the pt can pick up anytime during business hours. The pts wife states that she will go to the [facility] and Vascular office to pick up samples of Tikosyn tomorrow.

## 2016-09-02 NOTE — Telephone Encounter (Signed)
New Message   Pt c/o medication issue:  1. Name of Medication: dofetilide (TIKOSYN) 250 MCG capsule  2. How are you currently taking this medication (dosage and times per day)? 250mg   3. Are you having a reaction (difficulty breathing--STAT)? No  4. What is your medication issue? Pt states medication above was supposed to be ready for her to pick up at the front desk. She states if it is not ready she needs samples. She would like to know the status of the Rx that was supposed to be sent to [employer]

## 2016-09-02 NOTE — Telephone Encounter (Signed)
I have called [facility] and reordered the pts Tikosyn. I was advised that it will be 7 to 10 business days before order will arrive. The pts wife states that the pt has enough to last until Thursday this week. Our pharmacist at this office is out of Tikosyn 250 MCG so I have left a message at our A Fib clinic asking them to call me back concerning Tikosyn samples.  The pts wife is aware.

## 2016-09-03 ENCOUNTER — Encounter (HOSPITAL_COMMUNITY): Payer: Medicare Other

## 2016-09-04 NOTE — Telephone Encounter (Signed)
The pts Tikosyn has arrived in the office. I left a message on the pts VM stating that he may pick them up at his convenience.

## 2016-09-05 ENCOUNTER — Encounter: Payer: Self-pay | Admitting: Cardiology

## 2016-09-05 ENCOUNTER — Encounter (HOSPITAL_COMMUNITY): Payer: Medicare Other

## 2016-09-05 NOTE — Progress Notes (Signed)
Letter  

## 2016-09-10 ENCOUNTER — Encounter (HOSPITAL_COMMUNITY): Payer: Medicare Other

## 2016-09-12 ENCOUNTER — Encounter (HOSPITAL_COMMUNITY): Payer: Medicare Other

## 2016-09-17 ENCOUNTER — Encounter (HOSPITAL_COMMUNITY): Payer: Medicare Other

## 2016-09-19 ENCOUNTER — Encounter (HOSPITAL_COMMUNITY): Payer: Medicare Other

## 2016-09-26 ENCOUNTER — Encounter (HOSPITAL_COMMUNITY): Payer: Self-pay

## 2016-09-26 ENCOUNTER — Ambulatory Visit (HOSPITAL_COMMUNITY)
Admission: RE | Admit: 2016-09-26 | Discharge: 2016-09-26 | Disposition: A | Discharge status: Home / Self Care | Payer: Medicare Other | Source: Ambulatory Visit | Attending: Cardiology | Admitting: Cardiology

## 2016-09-26 VITALS — BP 114/72 | HR 77 | Wt 212.2 lb

## 2016-09-26 DIAGNOSIS — I10 Essential (primary) hypertension: Secondary | ICD-10-CM

## 2016-09-26 DIAGNOSIS — Z881 Allergy status to other antibiotic agents status: Secondary | ICD-10-CM | POA: Insufficient documentation

## 2016-09-26 DIAGNOSIS — J9611 Chronic respiratory failure with hypoxia: Secondary | ICD-10-CM | POA: Insufficient documentation

## 2016-09-26 DIAGNOSIS — I2510 Atherosclerotic heart disease of native coronary artery without angina pectoris: Secondary | ICD-10-CM | POA: Diagnosis not present

## 2016-09-26 DIAGNOSIS — Z950 Presence of cardiac pacemaker: Secondary | ICD-10-CM | POA: Insufficient documentation

## 2016-09-26 DIAGNOSIS — Z79891 Long term (current) use of opiate analgesic: Secondary | ICD-10-CM | POA: Insufficient documentation

## 2016-09-26 DIAGNOSIS — Z79899 Other long term (current) drug therapy: Secondary | ICD-10-CM | POA: Diagnosis not present

## 2016-09-26 DIAGNOSIS — Z87891 Personal history of nicotine dependence: Secondary | ICD-10-CM | POA: Insufficient documentation

## 2016-09-26 DIAGNOSIS — I5032 Chronic diastolic (congestive) heart failure: Secondary | ICD-10-CM | POA: Diagnosis present

## 2016-09-26 DIAGNOSIS — E119 Type 2 diabetes mellitus without complications: Secondary | ICD-10-CM | POA: Diagnosis not present

## 2016-09-26 DIAGNOSIS — Z7984 Long term (current) use of oral hypoglycemic drugs: Secondary | ICD-10-CM | POA: Diagnosis not present

## 2016-09-26 DIAGNOSIS — Z7901 Long term (current) use of anticoagulants: Secondary | ICD-10-CM | POA: Diagnosis not present

## 2016-09-26 DIAGNOSIS — I480 Paroxysmal atrial fibrillation: Secondary | ICD-10-CM | POA: Diagnosis not present

## 2016-09-26 DIAGNOSIS — Z953 Presence of xenogenic heart valve: Secondary | ICD-10-CM

## 2016-09-26 DIAGNOSIS — I110 Hypertensive heart disease with heart failure: Secondary | ICD-10-CM | POA: Diagnosis present

## 2016-09-26 DIAGNOSIS — I272 Pulmonary hypertension, unspecified: Secondary | ICD-10-CM

## 2016-09-26 LAB — BASIC METABOLIC PANEL
Anion gap: 6 (ref 5–15)
BUN: 21 mg/dL — ABNORMAL HIGH (ref 6–20)
CO2: 30 mmol/L (ref 22–32)
Calcium: 9.6 mg/dL (ref 8.9–10.3)
Chloride: 99 mmol/L — ABNORMAL LOW (ref 101–111)
Creatinine, Ser: 1.63 mg/dL — ABNORMAL HIGH (ref 0.61–1.24)
GFR calc Af Amer: 49 mL/min — ABNORMAL LOW (ref >60)
GFR calc non Af Amer: 42 mL/min — ABNORMAL LOW (ref >60)
Glucose, Bld: 92 mg/dL (ref 65–99)
Potassium: 4.5 mmol/L (ref 3.5–5.1)
Sodium: 135 mmol/L (ref 135–145)

## 2016-09-26 LAB — MAGNESIUM: Magnesium: 2.5 mg/dL — ABNORMAL HIGH (ref 1.7–2.4)

## 2016-09-26 NOTE — Progress Notes (Signed)
Advanced Heart Failure Clinic Note   Primary Care: Velna Hatchet, MD Primary Cardiologist: Dr. Acie Fredrickson Primary HF: Dr. Haroldine Laws   HPI:  [name] is a 66 y.o. 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   Admitted from Dr. Acie Fredrickson office 02/02/16 with severe DOE with any exercise or walking 10-20 feet. Pt admitted and planned for [facility] 02/05/16. Diuresed with IV lasix with rapid improvement of symptoms. Overall diuresed 15.2 L and down 14 lbs. Discharge weight was 205 lbs.  Echo 02/02/16 LVEF 60-65%, Grade 3 DD. Mild AI, Mild MR, RV mildly dilated and severely reduced. RA mildly dilated. PA peak pressure 74 mm Hg. Concerns for restrictive cardiomyopathy.  L/RHC 02/05/16 with no significant CAD. RHC showed significant difference between PCWP (50) and LVEDP (30) suggestive of MS but no MS on Echo. Weight at d/c was 205 pounds.  Echo 02/08/16 LVEF 55-60%, Restrictive physiology, (?)RV normal. No assessment of PA pressures.  Admitted 12/19 -> 04/19/16 for Tikosyn load. RHC completed during that admission as below.   He presents today for regular follow up. At last visit had to take extra metolazone. Only needs metolazone 1-2 times a month, makes him cramp so he avoids. Takes an extra torsemide 20 mg as needed, up to once a week for swelling or edema. Denies orthopnea. SOB walking from front desk.  Weight at home 210-212. Continue to drink > 2L and ice throughout the day. Denies lightheadedness or dizziness. Denies CP. Able to work out in the yard.   EKG 59 bpm.  QTc 518 on Tikosyn. Stable from previous.   [place] 04/17/16 RA = 13 RV = 66/3/15 PA = 69/23 (42) PCW = 21 (v=38) Fick cardiac output/index = 6.3/2.9 PVR = 3.8 WU Ao sat = 87% (radial artery ABG) PA sat = 43%, 43% SVC sat = 50%  L/RHC 02/05/16 - Mid RCA lesion, 10 %stenosed. - Ost Cx to Prox Cx  lesion, 20 %stenosed. - LV end diastolic pressure is moderately elevated. - Hemodynamic findings consistent with severe pulmonary hypertension. - No evidence of discordance on the LV/RV tracing. No evidence of pericardial constriction. - Severly elevated PCWP. - PA sat 51%. CO 6.2 L/min. Cardiac index 2.87. - No significant aortic valve gradient. - There is no aortic valve stenosis.  RHC Procedural Findings: Hemodynamics (mmHg) RA mean 30 RV 87/9 (26) PA 100/56 (72) PCWP 51 LVEDP 31 AO 142/73 (100) Cardiac Output (Fick) 6.21 Cardiac Index (Fick) 2.87 PVR = 3.5 WU    PFTs 4/17  FEV1  1.6 (47%) FVC 2.25 (49%) DLCO 54%   Past Medical History:  Diagnosis Date  . Aortic stenosis, mild   . Arthritis of hand    "just a little bit in both hands" (03/31/2012)  . Asthma    "little bit" (03/31/2012)  . Atrial fibrillation [facility])    dx '04; DCCV '04, placed on flecainide, failed DCCV 04/2010, flecainide stopped; s/p successful A.fib ablation 01/31/12  . CHF (congestive heart failure) ([place])   . Diabetes mellitus without complication (HCC)    borderline  . DJD (degenerative joint disease)   . Exertional dyspnea   . Fibromyalgia   . 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 GERD (gastroesophageal reflux disease)   . Heart murmur   . Hyperlipidemia   . Hypertension   . Hypothyroidism  S/P radiation  . Left atrial enlargement    LA size 72mm by echo 11/21/11  . Mitral regurgitation    trivial  . Obesity   . Obstructive sleep apnea    mild by sleep study 2013; pt stated he does not have a machine because "it wasn't bad enough for him to have one"  . Pacemaker 03/31/2012  . Restless leg syndrome   . Second degree AV block   . Visit for monitoring Tikosyn therapy 04/16/2016    Current Outpatient Prescriptions  Medication Sig Dispense Refill  . albuterol (PROVENTIL HFA;VENTOLIN HFA) 108 (90 Base) MCG/ACT inhaler Inhale 2 puffs into the lungs every 6 (six) hours as  needed for wheezing or shortness of breath. 1 Inhaler 5  . amLODipine (NORVASC) 10 MG tablet Take 0.5 tablets (5 mg total) by mouth daily. 30 tablet 11  . apixaban (ELIQUIS) 5 MG TABS tablet Take 1 tablet (5 mg total) by mouth 2 (two) times daily. 180 tablet 3  . budesonide-formoterol (SYMBICORT) 160-4.5 MCG/ACT inhaler Inhale 2 puffs into the lungs 2 (two) times daily. 1 Inhaler 6  . Cholecalciferol (VITAMIN D) 2000 UNITS CAPS Take 2,000 Units by mouth daily.    [name] dofetilide (TIKOSYN) 250 MCG capsule Take 1 capsule (250 mcg total) by mouth 2 (two) times daily. 180 capsule 3  . fexofenadine (ALLEGRA) 180 MG tablet Take 1 tablet (180 mg total) by mouth daily. 30 tablet 1  . fluticasone (FLONASE) 50 MCG/ACT nasal spray Place into both nostrils 2 (two) times daily as needed for allergies or rhinitis.    [name] gabapentin (NEURONTIN) 300 MG capsule Take 900 mg by mouth at bedtime.   3  . guaiFENesin (MUCINEX) 600 MG 12 hr tablet Take 600 mg by mouth 2 (two) times daily.    [name] levalbuterol (XOPENEX) 0.63 MG/3ML nebulizer solution Take 3 mLs (0.63 mg total) by nebulization every 6 (six) hours as needed for wheezing or shortness of breath. 360 mL 1  . levothyroxine (SYNTHROID, LEVOTHROID) 175 MCG tablet Take 175 mcg by mouth daily before breakfast.    . metFORMIN (GLUMETZA) 500 MG (MOD) 24 hr tablet Take 500 mg by mouth daily with breakfast.    . metoprolol (LOPRESSOR) 50 MG tablet Take 1 tablet (50 mg total) by mouth 2 (two) times daily. 180 tablet 3  . pantoprazole (PROTONIX) 40 MG tablet Take 40 mg by mouth daily.    . potassium chloride (K-DUR) 10 MEQ tablet Take 2 tablets (20 mEq total) by mouth 2 (two) times daily. Take an extra 2 tabs when you take Metolazone 360 tablet 3  . promethazine (PHENERGAN) 25 MG tablet Take 25 mg by mouth every 6 (six) hours as needed for nausea or vomiting.    [name] rOPINIRole (REQUIP) 1 MG tablet Take 1 tablet (1 mg total) by mouth daily. 90 tablet 2  . rosuvastatin (CRESTOR) 10  MG tablet     . spironolactone (ALDACTONE) 25 MG tablet Take 1 tablet (25 mg total) by mouth daily. 90 tablet 3  . torsemide (DEMADEX) 20 MG tablet Take 2 tablets (40 mg total) by mouth daily. 180 tablet 3  . traMADol (ULTRAM) 50 MG tablet Take 50 mg by mouth daily. Reported on 10/17/2015    . traZODone (DESYREL) 50 MG tablet Take 25 mg by mouth at bedtime.    . valACYclovir (VALTREX) 1000 MG tablet Take 1 tablet by mouth every 12 (twelve) hours as needed (fever blisters). Reported on 10/17/2015    . metolazone (  ZAROXOLYN) 2.5 MG tablet Take 1 tablet (2.5 mg total) by mouth as needed. Do not take more than once a week (Patient not taking: Reported on 08/19/2016) 5 tablet 3   No current facility-administered medications for this encounter.     Allergies  Allergen Reactions  . Meloxicam Rash  . 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 syndrome 09/02/15, resolved with diphenhydramine and slowing of rate      Social History   Social History  . Marital status: Married    Spouse name: N/A  . Number of children: N/A  . Years of education: N/A   Occupational History  . Not on file.   Social History Main Topics  . Smoking status: Former Smoker    Packs/day: 0.50    Years: 15.00    Types: Cigarettes    Quit date: 07/18/1991  . Smokeless tobacco: Never Used  . Alcohol use Yes     Comment: occasional  . Drug use: Yes    Types: Marijuana  . Sexual activity: Not Currently   Other Topics Concern  . Not on file   Social History Narrative   Lives in [place] with spouse.  Works as a Air traffic controller.    Family History  Problem Relation Age of Onset  . Heart failure Mother   . Stroke Father     Vitals:   09/26/16 1143  BP: 114/72  Pulse: 77  SpO2: 91%  Weight: 212 lb 3.2 oz (96.3 kg)    Wt Readings from Last 3 Encounters:  09/26/16 212 lb 3.2 oz (96.3 kg)  08/19/16 211 lb (95.7 kg)  07/11/16 216 lb 0.8 oz (98 kg)    PHYSICAL EXAM: General: Well appearing. No  resp difficulty. HEENT: Normal Neck: supple. JVP 6-7 cm. Carotids 2+ bilat; no bruits. No thyromegaly or nodule noted. Cor: PMI nondisplaced. RRR, No M/G/R noted Lungs: CTAB, normal effort. Abdomen: soft, non-tender, distended, no HSM. No bruits or masses. +BS. + Umbilical hernia Extremities: no cyanosis, clubbing, or rash. Trace ankle edema.  Neuro: alert & orientedx3, cranial nerves grossly intact. moves all 4 extremities w/o difficulty. Affect pleasant   ASSESSMENT & PLAN:   1. Chronic diastolic CHF with RV failure - Echo 02/02/16 LVEF 60-65%, Grade 3 DD. Mild AI, Mild MR, RV mildly dilated and severely reduced. RA mildly dilated. PA peak pressure 74 mm Hg. Concerns for restrictive cardiomyopathy. - Echo 02/08/16 LVEF 55-60%, Restrictive physiology, (?)RV normal. No assessment of PA pressures. - Volume status stable on exam - Continue torsemide 40 mg daily with extra 20 mg as needed. BMET/Mg today. - Take 2.5 mg metolazone as needed with extra 20 meq of K. - Continue potassium 20 meq BID. (extra on metolazone days as above.) - Continue spiro 25 mg daily.  - Continue amlodipine 5 mg daily. - Continue lopressor 50 mg BID  - Reinforced fluid restriction to < 2 L daily, sodium restriction to less than 2000 mg daily, and the importance of daily weights.   2. Paroxysmal Afib s/p AF ablation/Maze s/p pacemaker - Seen by Dr. Rayann Heman 05/20/16.  - Remains in NSR by EKG on Tikosyn.   - Continue Eliquis 2.5 mg BID. No bleeding.  3. AS s/p AVR - Stable bioprosthesis with mild regurgitation on Echo 02/08/16. No change.  4. Pulmonary HTN - suspect pulmonary venous hypertension -RHC in 10/17 with pulmonary venous HTN primarily. Focus on volume status.  5. Mild CAD by cath 01/2016 - Medical management. Continue atorvastatin 40  mg daily - No CP.  6. Chronic respiratory failure with hypoxia - Continue home 02.  - Sees pulmonary.  7. HTN - stable on current regimen.   Volume status stable. Labs  today. RTC 4 months.   Shirley Friar, PA-C  09/26/16

## 2016-09-26 NOTE — Patient Instructions (Signed)
Labs today (will call for abnormal results, otherwise no news is good news)  Follow up with Dr. Haroldine Laws in 4 months.  We will contact you to schedule appointment.

## 2016-11-13 ENCOUNTER — Telehealth: Payer: Self-pay | Admitting: Internal Medicine

## 2016-11-13 NOTE — Telephone Encounter (Signed)
The pt is aware that I have ordered his Tikosyn ([id number]) through [employer]. Per Gayland Curry at [employer] we should receive his Tikosyn within 7 to 10 days.

## 2016-11-13 NOTE — Telephone Encounter (Signed)
New Message     Needs to have Tikosyn order for pre authorization ?

## 2016-11-18 ENCOUNTER — Ambulatory Visit (INDEPENDENT_AMBULATORY_CARE_PROVIDER_SITE_OTHER): Payer: Medicare Other | Admitting: *Deleted

## 2016-11-18 DIAGNOSIS — I441 Atrioventricular block, second degree: Secondary | ICD-10-CM | POA: Diagnosis not present

## 2016-11-18 NOTE — Telephone Encounter (Signed)
The pts wife is advised that the pts Tikosyn arrived by mail today in the office and that he can pick it up at his convenience. She verbalized understanding and thanked me for my assistance.

## 2016-11-19 NOTE — Progress Notes (Signed)
Remote pacemaker transmission.   

## 2016-11-21 ENCOUNTER — Encounter: Payer: Self-pay | Admitting: Cardiology

## 2016-11-25 LAB — CUP PACEART REMOTE DEVICE CHECK
Battery Remaining Longevity: 65 mo
Battery Remaining Percentage: 57 %
Battery Voltage: 2.89 V
Brady Statistic AP VP Percent: 40 %
Brady Statistic AP VS Percent: 1 %
Brady Statistic AS VP Percent: 60 %
Brady Statistic AS VS Percent: 1 %
Brady Statistic RA Percent Paced: 39 %
Brady Statistic RV Percent Paced: 99 %
Date Time Interrogation Session: 20180723063655
Implantable Lead Implant Date: 20131203
Implantable Lead Implant Date: 20131203
Implantable Lead Location: 753859
Implantable Lead Location: 753860
Implantable Lead Model: 1948
Implantable Pulse Generator Implant Date: 20131203
Lead Channel Impedance Value: 400 Ohm
Lead Channel Impedance Value: 610 Ohm
Lead Channel Pacing Threshold Amplitude: 0.75 V
Lead Channel Pacing Threshold Amplitude: 0.875 V
Lead Channel Pacing Threshold Pulse Width: 0.5 ms
Lead Channel Pacing Threshold Pulse Width: 0.8 ms
Lead Channel Sensing Intrinsic Amplitude: 3.8 mV
Lead Channel Sensing Intrinsic Amplitude: 9 mV
Lead Channel Setting Pacing Amplitude: 1.125
Lead Channel Setting Pacing Amplitude: 1.75 V
Lead Channel Setting Pacing Pulse Width: 0.8 ms
Lead Channel Setting Sensing Sensitivity: 4 mV
Pulse Gen Model: 2210
Pulse Gen Serial Number: 7408461

## 2016-12-05 ENCOUNTER — Encounter: Payer: Self-pay | Admitting: Cardiology

## 2017-01-21 ENCOUNTER — Other Ambulatory Visit (HOSPITAL_COMMUNITY): Payer: Self-pay | Admitting: Internal Medicine

## 2017-02-17 ENCOUNTER — Ambulatory Visit (INDEPENDENT_AMBULATORY_CARE_PROVIDER_SITE_OTHER): Payer: Medicare Other | Admitting: *Deleted

## 2017-02-17 DIAGNOSIS — I441 Atrioventricular block, second degree: Secondary | ICD-10-CM

## 2017-02-17 NOTE — Progress Notes (Signed)
Remote pacemaker transmission.   

## 2017-02-18 ENCOUNTER — Telehealth (HOSPITAL_COMMUNITY): Payer: Self-pay | Admitting: *Deleted

## 2017-02-18 NOTE — Telephone Encounter (Signed)
Medication Samples have been provided to the patient.  Drug name: Eliquis     Strength: 5 mg       Qty: 2 boxes  LOT: [id number] Exp.Date: 12/20  Dosing instructions: Take 1 Tablet Twice Daily  The patient has been instructed regarding the correct time, dose, and frequency of taking this medication, including desired effects and most common side effects.   Darron Doom 3:04 PM 02/18/2017

## 2017-02-19 LAB — CUP PACEART REMOTE DEVICE CHECK
Battery Remaining Longevity: 65 mo
Battery Remaining Percentage: 57 %
Battery Voltage: 2.89 V
Brady Statistic AP VP Percent: 45 %
Brady Statistic AP VS Percent: 1 %
Brady Statistic AS VP Percent: 55 %
Brady Statistic AS VS Percent: 1 %
Brady Statistic RA Percent Paced: 45 %
Brady Statistic RV Percent Paced: 99 %
Date Time Interrogation Session: 20181022070215
Implantable Lead Implant Date: 20131203
Implantable Lead Implant Date: 20131203
Implantable Lead Location: 753859
Implantable Lead Location: 753860
Implantable Lead Model: 1948
Implantable Pulse Generator Implant Date: 20131203
Lead Channel Impedance Value: 400 Ohm
Lead Channel Impedance Value: 610 Ohm
Lead Channel Pacing Threshold Amplitude: 0.75 V
Lead Channel Pacing Threshold Amplitude: 0.875 V
Lead Channel Pacing Threshold Pulse Width: 0.5 ms
Lead Channel Pacing Threshold Pulse Width: 0.8 ms
Lead Channel Sensing Intrinsic Amplitude: 3.6 mV
Lead Channel Sensing Intrinsic Amplitude: 9 mV
Lead Channel Setting Pacing Amplitude: 1.125
Lead Channel Setting Pacing Amplitude: 1.75 V
Lead Channel Setting Pacing Pulse Width: 0.8 ms
Lead Channel Setting Sensing Sensitivity: 4 mV
Pulse Gen Model: 2210
Pulse Gen Serial Number: 7408461

## 2017-02-20 ENCOUNTER — Telehealth: Payer: Self-pay | Admitting: Internal Medicine

## 2017-02-20 NOTE — Telephone Encounter (Signed)
Patient ID for tikosyn patient assistance is [id number] - reordered tikosyn 270mcg BID today -- will arrive at the [address] office in 7-10 business days. Patient's wife was notified of the above. Tikosyn patient assistance will expire 04/28/17 - will mail a new patient assistance packet to pt to renew. Wife will be looking for call when tikosyn arrives via fedex.

## 2017-02-20 NOTE — Telephone Encounter (Signed)
New Message   *STAT* If patient is at the pharmacy, call can be transferred to refill team.   1. Which medications need to be refilled? (please list name of each medication and dose if known) Tikosyn 250mg    2. Which pharmacy/location (including street and city if local pharmacy) is medication to be sent to? Per pt wife states she spoke with a nurse who was able to order the medication for free for the pt and would like to do the same again.   3. Do they need a 30 day or 90 day supply? [place]

## 2017-02-21 ENCOUNTER — Encounter: Payer: Self-pay | Admitting: Cardiology

## 2017-02-25 NOTE — Telephone Encounter (Addendum)
The pts wife called back. I advised her that the pts Tikosyn has arrived in the office and that they can pick up at their convenience.  Also, while on the phone the pts wife requested samples of Eliquis for the pt. I have advised her that I have left him some samples of Eliquis in our front office along with a [place] application for him to complete and return to Dr Haroldine Laws.  She thanked me for my help.

## 2017-02-25 NOTE — Telephone Encounter (Signed)
The pts Pt Assistance Tikosyn arrived in the office today via [employer]. I have placed the pts Tikosyn in the front office so he can pick up. I have left a message on his VM asking him to call me back.

## 2017-05-12 ENCOUNTER — Telehealth (HOSPITAL_COMMUNITY): Payer: Self-pay | Admitting: Pharmacist

## 2017-05-12 ENCOUNTER — Telehealth: Payer: Self-pay | Admitting: Internal Medicine

## 2017-05-12 MED ORDER — DOFETILIDE 250 MCG PO CAPS: 250.0000 ug | ORAL_CAPSULE | Freq: Two times a day (BID) | ORAL | 0 refills | Status: DC

## 2017-05-12 NOTE — Telephone Encounter (Signed)
Pt' medication was sent to pt's pharmacy as requested. Confirmation received.  

## 2017-05-12 NOTE — Telephone Encounter (Signed)
Mrs. Spinney called stating that she hasn't gotten around to filling out Tikosyn patient assistance application for this year yet. She said Mr. Penson has 1 bottle left and was wondering if we had samples. I asked her to call [place] since Dr. Rayann Heman is managing his Tikosyn and she verbalized understanding.   Ruta Hinds. Velva Harman, PharmD, BCPS, CPP Clinical Pharmacist Phone: [phone] 05/12/2017 1:46 PM

## 2017-05-12 NOTE — Telephone Encounter (Signed)
Patient calling the office for samples of medication:   1.  What medication and dosage are you requesting samples for? dofetilide (TIKOSYN) 250 MCG capsule  2.  Are you currently out of this medication? Unknown pt wife said she was going to call back before I could continue

## 2017-05-13 ENCOUNTER — Other Ambulatory Visit: Payer: Self-pay | Admitting: Internal Medicine

## 2017-05-13 ENCOUNTER — Telehealth: Payer: Self-pay

## 2017-05-13 NOTE — Telephone Encounter (Signed)
The pts wife dropped the pts [employer] pt assistance application off in the front office for Dofetilide. Dr Rayann Heman has signed the application and I have faxed all to [place].

## 2017-05-14 ENCOUNTER — Other Ambulatory Visit (HOSPITAL_COMMUNITY): Payer: Self-pay | Admitting: Internal Medicine

## 2017-05-19 ENCOUNTER — Ambulatory Visit (INDEPENDENT_AMBULATORY_CARE_PROVIDER_SITE_OTHER): Payer: Medicare Other | Admitting: *Deleted

## 2017-05-19 DIAGNOSIS — I441 Atrioventricular block, second degree: Secondary | ICD-10-CM | POA: Diagnosis not present

## 2017-05-19 NOTE — Progress Notes (Signed)
Remote pacemaker transmission.   

## 2017-05-20 ENCOUNTER — Encounter: Payer: Self-pay | Admitting: Cardiology

## 2017-05-21 NOTE — Telephone Encounter (Signed)
The pts wife is advised that the pts Tikosyn has arrived in the office and that they may pick up at their convenience. She verbalized understanding and thanked me for my help.

## 2017-05-26 ENCOUNTER — Other Ambulatory Visit (HOSPITAL_COMMUNITY): Payer: Self-pay | Admitting: Pharmacist

## 2017-05-26 MED ORDER — APIXABAN 5 MG PO TABS: 5.0000 mg | ORAL_TABLET | Freq: Two times a day (BID) | ORAL | 3 refills | Status: DC

## 2017-05-29 ENCOUNTER — Encounter (HOSPITAL_COMMUNITY): Payer: Self-pay | Admitting: Vascular Surgery

## 2017-05-29 ENCOUNTER — Telehealth (HOSPITAL_COMMUNITY): Payer: Self-pay | Admitting: Vascular Surgery

## 2017-05-29 NOTE — Telephone Encounter (Signed)
Sent pt letter to make f/u appt w/ db

## 2017-06-11 LAB — CUP PACEART REMOTE DEVICE CHECK
Battery Remaining Longevity: 63 mo
Battery Remaining Percentage: 57 %
Battery Voltage: 2.89 V
Brady Statistic AP VP Percent: 50 %
Brady Statistic AP VS Percent: 1 %
Brady Statistic AS VP Percent: 49 %
Brady Statistic AS VS Percent: 1 %
Brady Statistic RA Percent Paced: 50 %
Brady Statistic RV Percent Paced: 99 %
Date Time Interrogation Session: 20190121092847
Implantable Lead Implant Date: 20131203
Implantable Lead Implant Date: 20131203
Implantable Lead Location: 753859
Implantable Lead Location: 753860
Implantable Lead Model: 1948
Implantable Pulse Generator Implant Date: 20131203
Lead Channel Impedance Value: 380 Ohm
Lead Channel Impedance Value: 580 Ohm
Lead Channel Pacing Threshold Amplitude: 0.75 V
Lead Channel Pacing Threshold Amplitude: 1 V
Lead Channel Pacing Threshold Pulse Width: 0.5 ms
Lead Channel Pacing Threshold Pulse Width: 0.8 ms
Lead Channel Sensing Intrinsic Amplitude: 3.8 mV
Lead Channel Sensing Intrinsic Amplitude: 9 mV
Lead Channel Setting Pacing Amplitude: 1.25 V
Lead Channel Setting Pacing Amplitude: 1.75 V
Lead Channel Setting Pacing Pulse Width: 0.8 ms
Lead Channel Setting Sensing Sensitivity: 4 mV
Pulse Gen Model: 2210
Pulse Gen Serial Number: 7408461

## 2017-06-14 ENCOUNTER — Other Ambulatory Visit: Payer: Self-pay

## 2017-06-14 ENCOUNTER — Emergency Department (HOSPITAL_COMMUNITY): Payer: Medicare Other

## 2017-06-14 ENCOUNTER — Encounter (HOSPITAL_COMMUNITY): Payer: Self-pay | Admitting: *Deleted

## 2017-06-14 DIAGNOSIS — I110 Hypertensive heart disease with heart failure: Secondary | ICD-10-CM | POA: Diagnosis not present

## 2017-06-14 DIAGNOSIS — E039 Hypothyroidism, unspecified: Secondary | ICD-10-CM | POA: Insufficient documentation

## 2017-06-14 DIAGNOSIS — Z79899 Other long term (current) drug therapy: Secondary | ICD-10-CM | POA: Diagnosis not present

## 2017-06-14 DIAGNOSIS — R05 Cough: Secondary | ICD-10-CM | POA: Diagnosis present

## 2017-06-14 DIAGNOSIS — Z87891 Personal history of nicotine dependence: Secondary | ICD-10-CM | POA: Diagnosis not present

## 2017-06-14 DIAGNOSIS — J111 Influenza due to unidentified influenza virus with other respiratory manifestations: Secondary | ICD-10-CM | POA: Insufficient documentation

## 2017-06-14 DIAGNOSIS — J1189 Influenza due to unidentified influenza virus with other manifestations: Secondary | ICD-10-CM | POA: Insufficient documentation

## 2017-06-14 DIAGNOSIS — J45909 Unspecified asthma, uncomplicated: Secondary | ICD-10-CM | POA: Insufficient documentation

## 2017-06-14 DIAGNOSIS — I509 Heart failure, unspecified: Secondary | ICD-10-CM | POA: Diagnosis not present

## 2017-06-14 LAB — COMPREHENSIVE METABOLIC PANEL
ALT: 23 U/L (ref 17–63)
AST: 26 U/L (ref 15–41)
Albumin: 3.8 g/dL (ref 3.5–5.0)
Alkaline Phosphatase: 106 U/L (ref 38–126)
Anion gap: 10 (ref 5–15)
BUN: 13 mg/dL (ref 6–20)
CO2: 24 mmol/L (ref 22–32)
Calcium: 9.2 mg/dL (ref 8.9–10.3)
Chloride: 101 mmol/L (ref 101–111)
Creatinine, Ser: 1.43 mg/dL — ABNORMAL HIGH (ref 0.61–1.24)
GFR calc Af Amer: 57 mL/min — ABNORMAL LOW (ref >60)
GFR calc non Af Amer: 49 mL/min — ABNORMAL LOW (ref >60)
Glucose, Bld: 124 mg/dL — ABNORMAL HIGH (ref 65–99)
Potassium: 4.2 mmol/L (ref 3.5–5.1)
Sodium: 135 mmol/L (ref 135–145)
Total Bilirubin: 1.3 mg/dL — ABNORMAL HIGH (ref 0.3–1.2)
Total Protein: <3 g/dL — ABNORMAL LOW (ref 6.5–8.1)

## 2017-06-14 LAB — CBC WITH DIFFERENTIAL/PLATELET
Basophils Absolute: 0 10*3/uL (ref 0.0–0.1)
Basophils Relative: 0 %
Eosinophils Absolute: 0 10*3/uL (ref 0.0–0.7)
Eosinophils Relative: 0 %
HCT: 36.2 % — ABNORMAL LOW (ref 39.0–52.0)
Hemoglobin: 11.6 g/dL — ABNORMAL LOW (ref 13.0–17.0)
Lymphocytes Relative: 9 %
Lymphs Abs: 0.8 10*3/uL (ref 0.7–4.0)
MCH: 27.6 pg (ref 26.0–34.0)
MCHC: 32 g/dL (ref 30.0–36.0)
MCV: 86.2 fL (ref 78.0–100.0)
Monocytes Absolute: 0.3 10*3/uL (ref 0.1–1.0)
Monocytes Relative: 4 %
Neutro Abs: 7.4 10*3/uL (ref 1.7–7.7)
Neutrophils Relative %: 87 %
Platelets: 138 10*3/uL — ABNORMAL LOW (ref 150–400)
RBC: 4.2 MIL/uL — ABNORMAL LOW (ref 4.22–5.81)
RDW: 16.1 % — ABNORMAL HIGH (ref 11.5–15.5)
WBC: 8.5 10*3/uL (ref 4.0–10.5)

## 2017-06-14 LAB — I-STAT CG4 LACTIC ACID, ED: Lactic Acid, Venous: 1.7 mmol/L (ref 0.5–1.9)

## 2017-06-14 NOTE — ED Notes (Signed)
Called for triage no answer  

## 2017-06-14 NOTE — ED Triage Notes (Signed)
Pt reports onset of n/v/d yesterday. Has bilateral leg pain, headaches, productive cough and low grade fever. Unable to keep his meds down.

## 2017-06-15 ENCOUNTER — Emergency Department (HOSPITAL_COMMUNITY)
Admission: EM | Admit: 2017-06-15 | Discharge: 2017-06-15 | Disposition: A | Discharge status: Home / Self Care | Payer: Medicare Other | Attending: Emergency Medicine | Admitting: Emergency Medicine

## 2017-06-15 DIAGNOSIS — R69 Illness, unspecified: Secondary | ICD-10-CM

## 2017-06-15 DIAGNOSIS — J111 Influenza due to unidentified influenza virus with other respiratory manifestations: Secondary | ICD-10-CM

## 2017-06-15 MED ORDER — ONDANSETRON 4 MG PO TBDP: 4.0000 mg | ORAL_TABLET | Freq: Three times a day (TID) | ORAL | 0 refills | Status: DC | PRN

## 2017-06-15 MED ORDER — PROMETHAZINE-DM 6.25-15 MG/5ML PO SYRP: 5.0000 mL | ORAL_SOLUTION | Freq: Four times a day (QID) | ORAL | 0 refills | Status: DC | PRN

## 2017-06-15 MED ORDER — DOFETILIDE 250 MCG PO CAPS
250.0000 ug | ORAL_CAPSULE | Freq: Once | ORAL | Status: AC
  Administered 2017-06-15: 250 ug via ORAL
  Filled 2017-06-15: qty 1

## 2017-06-15 MED ORDER — APIXABAN 5 MG PO TABS
5.0000 mg | ORAL_TABLET | Freq: Once | ORAL | Status: AC
  Administered 2017-06-15: 5 mg via ORAL
  Filled 2017-06-15: qty 1

## 2017-06-15 MED ORDER — OSELTAMIVIR PHOSPHATE 75 MG PO CAPS: 75.0000 mg | ORAL_CAPSULE | Freq: Two times a day (BID) | ORAL | 0 refills | Status: DC

## 2017-06-15 MED ORDER — SODIUM CHLORIDE 0.9 % IV SOLN
8.0000 mg | Freq: Once | INTRAVENOUS | Status: AC
  Administered 2017-06-15: 8 mg via INTRAVENOUS
  Filled 2017-06-15: qty 4

## 2017-06-15 MED ORDER — MORPHINE SULFATE (PF) 4 MG/ML IV SOLN
4.0000 mg | Freq: Once | INTRAVENOUS | Status: AC
  Administered 2017-06-15: 4 mg via INTRAVENOUS
  Filled 2017-06-15: qty 1

## 2017-06-15 MED ORDER — PREDNISONE 20 MG PO TABS: 20.0000 mg | ORAL_TABLET | Freq: Two times a day (BID) | ORAL | 0 refills | Status: DC

## 2017-06-15 MED ORDER — BENZONATATE 100 MG PO CAPS: 100.0000 mg | ORAL_CAPSULE | Freq: Three times a day (TID) | ORAL | 0 refills | Status: DC

## 2017-06-15 MED ORDER — SODIUM CHLORIDE 0.9 % IV BOLUS (SEPSIS)
250.0000 mL | Freq: Once | INTRAVENOUS | Status: AC
  Administered 2017-06-15: 250 mL via INTRAVENOUS

## 2017-06-15 MED ORDER — METHYLPREDNISOLONE SODIUM SUCC 125 MG IJ SOLR
125.0000 mg | Freq: Once | INTRAMUSCULAR | Status: AC
  Administered 2017-06-15: 125 mg via INTRAVENOUS
  Filled 2017-06-15: qty 2

## 2017-06-15 MED ORDER — ALBUTEROL SULFATE (2.5 MG/3ML) 0.083% IN NEBU
2.5000 mg | INHALATION_SOLUTION | Freq: Once | RESPIRATORY_TRACT | Status: AC
  Administered 2017-06-15: 2.5 mg via RESPIRATORY_TRACT
  Filled 2017-06-15: qty 3

## 2017-06-15 NOTE — Discharge Instructions (Signed)
Sir, I am very sorry you had such a long wait in the emergency room tonight. I am glad you stayed for treatment.  You are being placed on Tamiflu for presumed influenza.  Take this with food to avoid upset stomach.  Zofran for nausea.  Prednisone for your wheezing exacerbation.  Use your inhalers as needed.  Tessalon for daytime cough.  Phenergan for nighttime cough-this can cause sleepiness.

## 2017-06-15 NOTE — ED Provider Notes (Signed)
[facility] EMERGENCY DEPARTMENT Provider Note   CSN: [id number] Arrival date & time: 06/14/17  1832     History   Chief Complaint Chief Complaint  Patient presents with  . Cough  . Leg Pain  . Emesis    HPI [name] is a 67 y.o. male.  Chief complaint is cough, vomiting, fever.  HPI 67 year old male.  Onset of symptoms yesterday.  Has myalgias and legs pain.  Headache and sore throat.  Cough.  Vomiting.  He became more concerned when he was unable to keep down his medications today.  He is on home O2 at 2 L.   Past Medical History:  Diagnosis Date  . Aortic stenosis, mild   . Arthritis of hand    "just a little bit in both hands" (03/31/2012)  . Asthma    "little bit" (03/31/2012)  . Atrial fibrillation [facility])    dx '04; DCCV '04, placed on flecainide, failed DCCV 04/2010, flecainide stopped; s/p successful A.fib ablation 01/31/12  . CHF (congestive heart failure) ([place])   . Diabetes mellitus without complication (HCC)    borderline  . DJD (degenerative joint disease)   . Exertional dyspnea   . Fibromyalgia   . First degree AV block    post ablation, ~424ms  . GERD (gastroesophageal reflux disease)   . Heart murmur   . Hyperlipidemia   . Hypertension   . Hypothyroidism    S/P radiation  . Left atrial enlargement    LA size 22mm by echo 11/21/11  . Mitral regurgitation    trivial  . Obesity   . Obstructive sleep apnea    mild by sleep study 2013; pt stated he does not have a machine because "it wasn't bad enough for him to have one"  . Pacemaker 03/31/2012  . Restless leg syndrome   . Second degree AV block   . Visit for monitoring Tikosyn therapy 04/16/2016    Patient Active Problem List   Diagnosis Date Noted  . Chronic respiratory failure ([place]) 05/23/2016  . CAD (coronary artery disease) 05/23/2016  . Persistent atrial fibrillation ([place])   . Atypical atrial flutter ([place])   . Visit for monitoring Tikosyn therapy 04/16/2016  .  Diastolic heart failure ([place]) 02/03/2016  . Cough 12/06/2015  . Hallucinations 09/04/2015  . Lymphadenopathy 09/04/2015  . Ascending aortic aneurysm ([place]) 09/04/2015  . Hypoxia 09/02/2015  . S/P aortic valve replacement with bioprosthetic valve 09/02/2015  . Pleural effusion 09/02/2015  . S/P AVR 08/22/2015  . GERD (gastroesophageal reflux disease) 09/20/2014  . 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 Dyspnea 09/15/2013  . Pulmonary hypertension ([place]) 09/15/2013  . Shortness of breath 05/05/2013  . 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 Bradycardia 03/31/2012  . Second degree AV block 03/31/2012  . AV block, 1st degree 02/01/2012  . PAF (paroxysmal atrial fibrillation) ([place]) 11/13/2011  . Fatigue 11/13/2011  . Hypertension 11/13/2011  . Aortic stenosis 11/13/2011  . Sleep apnea 11/13/2011    Past Surgical History:  Procedure Laterality Date  . AORTIC VALVE REPLACEMENT N/A 08/22/2015   Procedure: AORTIC VALVE REPLACEMENT (AVR);  Surgeon: Ivin Poot, MD;  Location: [place];  Service: Open Heart Surgery;  Laterality: N/A;  . ATRIAL FIBRILLATION ABLATION  01/30/2012   PVI by Dr. Rayann Heman  . ATRIAL FIBRILLATION ABLATION N/A 01/31/2012   Procedure: ATRIAL FIBRILLATION ABLATION;  Surgeon: Thompson Grayer, MD;  Location: [facility] CATH LAB;  Service: Cardiovascular;  Laterality: N/A;  . BACK SURGERY     X  3  . CARDIAC CATHETERIZATION N/A 08/09/2015   Procedure: Right/Left Heart Cath and Coronary Angiography;  Surgeon: Peter M Martinique, MD;  Location: [place] CV LAB;  Service: Cardiovascular;  Laterality: N/A;  . CARDIAC CATHETERIZATION N/A 02/05/2016   Procedure: Right/Left Heart Cath and Coronary Angiography;  Surgeon: Jettie Booze, MD;  Location: [place] CV LAB;  Service: Cardiovascular;  Laterality: N/A;  . CARDIAC CATHETERIZATION N/A 04/17/2016   Procedure: Right Heart Cath;  Surgeon: Jolaine Artist, MD;  Location: [place] CV LAB;  Service: Cardiovascular;  Laterality: N/A;  .  CARDIOVASCULAR STRESS TEST  03/12/2002   EF 48%, NO EVIDENCE OF ISCHEMIA  . CARDIOVERSION  01/2012; 03/31/2012  . CARDIOVERSION N/A 02/25/2012   Procedure: CARDIOVERSION;  Surgeon: Thompson Grayer, MD;  Location[facility] C[facility] LAB;  Service: Cardiovascular;  Laterality: N/A;  . CARDIOVERSION N/A 03/31/2012   Procedure: CARDIOVERSION;  Surgeon: Thompson Grayer, MD;  Location: [facility] CATH LAB;  Service: Cardiovascular;  Laterality: N/A;  . CARDIOVERSION N/A 11/23/2015   Procedure: CARDIOVERSION;  Surgeon: Larey Dresser, MD;  Location: [facility] ENDOSCOPY;  Service: Cardiovascular;  Laterality: N/A;  . CARDIOVERSION N/A 04/08/2016   Procedure: CARDIOVERSION;  Surgeon: Jolaine Artist, MD;  Location: [facility] ENDOSCOPY;  Service: Cardiovascular;  Laterality: N/A;  . CLIPPING OF ATRIAL APPENDAGE N/A 08/22/2015   Procedure: CLIPPING OF ATRIAL APPENDAGE;  Surgeon: Ivin Poot, MD;  Location: [place];  Service: Open Heart Surgery;  Laterality: N/A;  . DOPPLER ECHOCARDIOGRAPHY  03/11/2002   EF 70-75%  . FINGER SURGERY Left    Middle finger  . FINGER TENDON REPAIR  1980's   "right little finger" (03/31/2012)  . INSERT / REPLACE / REMOVE PACEMAKER     St Jude  . LEFT AND RIGHT HEART CATHETERIZATION WITH CORONARY ANGIOGRAM N/A 10/27/2012   Procedure: LEFT AND RIGHT HEART CATHETERIZATION WITH CORONARY ANGIOGRAM;  Surgeon: Laverda Page, MD;  Location: [facility] CATH LAB;  Service: Cardiovascular;  Laterality: N/A;  . LUMBAR DISC SURGERY  1980's X2;  2000's  . MAZE N/A 08/22/2015   Procedure: MAZE;  Surgeon: Ivin Poot, MD;  Location: [place];  Service: Open Heart Surgery;  Laterality: N/A;  . PACEMAKER INSERTION  03/31/2012   STJ Accent DR pacemaker implanted by Dr Rayann Heman  . PERMANENT PACEMAKER INSERTION N/A 03/31/2012   Procedure: PERMANENT PACEMAKER INSERTION;  Surgeon: Thompson Grayer, MD;  Location: [facility] CATH LAB;  Service: Cardiovascular;  Laterality: N/A;  . TEE WITHOUT CARDIOVERSION  01/30/2012   Procedure: TRANSESOPHAGEAL  ECHOCARDIOGRAM (TEE);  Surgeon: Larey Dresser, MD;  Location: [place];  Service: Cardiovascular;  Laterality: N/A;  ablation next day  . TEE WITHOUT CARDIOVERSION N/A 08/22/2015   Procedure: TRANSESOPHAGEAL ECHOCARDIOGRAM (TEE);  Surgeon: Ivin Poot, MD;  Location: [place];  Service: Open Heart Surgery;  Laterality: N/A;  . US ECHOCARDIOGRAPHY  08/07/2009   EF 55-60%       Home Medications    Prior to Admission medications   Medication Sig Start Date End Date Taking? Authorizing Provider  albuterol (PROVENTIL HFA;VENTOLIN HFA) 108 (90 Base) MCG/ACT inhaler Inhale 2 puffs into the lungs every 6 (six) hours as needed for wheezing or shortness of breath. 12/28/15   Collene Gobble, MD  amLODipine (NORVASC) 10 MG tablet Take 0.5 tablets (5 mg total) by mouth daily. 09/18/15   Nahser, Wonda Cheng, MD  apixaban (ELIQUIS) 5 MG TABS tablet Take 1 tablet (5 mg total) by mouth 2 (two) times daily. 05/26/17  Bensimhon, Shaune Pascal, MD  benzonatate (TESSALON) 100 MG capsule Take 1 capsule (100 mg total) by mouth every 8 (eight) hours. 06/15/17   Tanna Furry, MD  budesonide-formoterol [facility]) 160-4.5 MCG/ACT inhaler Inhale 2 puffs into the lungs 2 (two) times daily. 11/13/15   Collene Gobble, MD  Cholecalciferol (VITAMIN D) 2000 UNITS CAPS Take 2,000 Units by mouth daily.    [provider]  dofetilide (TIKOSYN) 250 MCG capsule TAKE ONE CAPSULE BY MOUTH TWICE DAILY PT MUST MAKE APPT FOR FURTHER REFILLS 05/13/17   Allred, Jeneen Rinks, MD  fexofenadine (ALLEGRA) 180 MG tablet Take 1 tablet (180 mg total) by mouth daily. 03/01/14   Collene Gobble, MD  fluticasone (FLONASE) 50 MCG/ACT nasal spray Place into both nostrils 2 (two) times daily as needed for allergies or rhinitis.    [provider]  gabapentin (NEURONTIN) 300 MG capsule Take 900 mg by mouth at bedtime.  09/13/14   [provider]  guaiFENesin (MUCINEX) 600 MG 12 hr tablet Take 600 mg by mouth 2 (two) times daily.     [provider]  levalbuterol Penne Lash) 0.63 MG/3ML nebulizer solution Take 3 mLs (0.63 mg total) by nebulization every 6 (six) hours as needed for wheezing or shortness of breath. 09/28/15   Mannam, Hart Robinsons, MD  levothyroxine (SYNTHROID, LEVOTHROID) 175 MCG tablet Take 175 mcg by mouth daily before breakfast.    [provider]  metFORMIN (GLUMETZA) 500 MG (MOD) 24 hr tablet Take 500 mg by mouth daily with breakfast.    [provider]  metolazone (ZAROXOLYN) 2.5 MG tablet Take 1 tablet (2.5 mg total) by mouth as needed. Do not take more than once a week Patient not taking: Reported on 08/19/2016 06/27/16 09/25/16  Bensimhon, Shaune Pascal, MD  metoprolol (LOPRESSOR) 50 MG tablet Take 1 tablet (50 mg total) by mouth 2 (two) times daily. 07/11/16   Bensimhon, Shaune Pascal, MD  ondansetron (ZOFRAN ODT) 4 MG disintegrating tablet Take 1 tablet (4 mg total) by mouth every 8 (eight) hours as needed for nausea. 06/15/17   Tanna Furry, MD  oseltamivir (TAMIFLU) 75 MG capsule Take 1 capsule (75 mg total) by mouth every 12 (twelve) hours. 06/15/17   Tanna Furry, MD  pantoprazole (PROTONIX) 40 MG tablet Take 40 mg by mouth daily.    [provider]  potassium chloride (K-DUR) 10 MEQ tablet Take 2 tablets (20 mEq total) by mouth 2 (two) times daily. Take an extra 2 tabs when you take Metolazone 06/27/16   Bensimhon, Shaune Pascal, MD  potassium chloride (K-DUR,KLOR-CON) 10 MEQ tablet TAKE 2 TABLETS BY MOUTH TWO TIMES DAILY 01/22/17   Bensimhon, Shaune Pascal, MD  predniSONE (DELTASONE) 20 MG tablet Take 1 tablet (20 mg total) by mouth 2 (two) times daily with a meal. 06/15/17   Tanna Furry, MD  promethazine (PHENERGAN) 25 MG tablet Take 25 mg by mouth every 6 (six) hours as needed for nausea or vomiting.    [provider]  promethazine-dextromethorphan (PROMETHAZINE-DM) 6.25-15 MG/5ML syrup Take 5 mLs by mouth 4 (four) times daily as needed for cough. 06/15/17   Tanna Furry, MD  rOPINIRole (REQUIP)  1 MG tablet Take 1 tablet (1 mg total) by mouth daily. 12/24/13   Collene Gobble, MD  rosuvastatin (CRESTOR) 10 MG tablet  08/08/16   [provider]  spironolactone (ALDACTONE) 25 MG tablet Take 1 tablet (25 mg total) by mouth daily. 02/16/16   Shirley Friar, PA-C  torsemide (DEMADEX) 20 MG tablet  Take 2 tablets (40 mg total) by mouth daily. Please call for office visit [phone] 05/14/17   Bensimhon, Shaune Pascal, MD  traMADol (ULTRAM) 50 MG tablet Take 50 mg by mouth daily. Reported on 10/17/2015 10/01/13   [provider]  traZODone (DESYREL) 50 MG tablet Take 25 mg by mouth at bedtime.    [provider]  valACYclovir (VALTREX) 1000 MG tablet Take 1 tablet by mouth every 12 (twelve) hours as needed (fever blisters). Reported on 10/17/2015 04/13/13   [provider]    Family History Family History  Problem Relation Age of Onset  . Heart failure Mother   . Stroke Father     Social History Social History   Tobacco Use  . Smoking status: Former Smoker    Packs/day: 0.50    Years: 15.00    Pack years: 7.50    Types: Cigarettes    Last attempt to quit: 07/18/1991    Years since quitting: 25.9  . Smokeless tobacco: Never Used  Substance Use Topics  . Alcohol use: Yes    Comment: occasional  . Drug use: Yes    Types: Marijuana     Allergies   Meloxicam and Vancomycin   Review of Systems Review of Systems  Constitutional: Positive for fatigue and fever. Negative for appetite change, chills and diaphoresis.  HENT: Negative for mouth sores, sore throat and trouble swallowing.   Eyes: Negative for visual disturbance.  Respiratory: Positive for cough and shortness of breath. Negative for chest tightness and wheezing.   Cardiovascular: Negative for chest pain.  Gastrointestinal: Positive for diarrhea, nausea and vomiting. Negative for abdominal distention and abdominal pain.  Endocrine: Negative for polydipsia, polyphagia and polyuria.    Genitourinary: Negative for dysuria, frequency and hematuria.  Musculoskeletal: Positive for myalgias. Negative for gait problem.  Skin: Negative for color change, pallor and rash.  Neurological: Negative for dizziness, syncope, light-headedness and headaches.  Hematological: Does not bruise/bleed easily.  Psychiatric/Behavioral: Negative for behavioral problems and confusion.     Physical Exam Updated Vital Signs BP (!) 128/106   Pulse 65   Temp 98.3 F (36.8 C) (Oral)   Resp 18   SpO2 100%   Physical Exam  Constitutional: He is oriented to person, place, and time. He appears well-developed and well-nourished. No distress.  HENT:  Head: Normocephalic.  Eyes: Conjunctivae are normal. Pupils are equal, round, and reactive to light. No scleral icterus.  Neck: Normal range of motion. Neck supple. No thyromegaly present.  Cardiovascular: Normal rate and regular rhythm. Exam reveals no gallop and no friction rub.  No murmur heard. Pulmonary/Chest: No respiratory distress. He has no wheezes. He has no rales.  Wheezing and mild prolongation.  Abdominal: Soft. Bowel sounds are normal. He exhibits no distension. There is no tenderness. There is no rebound.  Musculoskeletal: Normal range of motion.  Normal exam.  Neurological: He is alert and oriented to person, place, and time.  Skin: Skin is warm and dry. No rash noted.  Psychiatric: He has a normal mood and affect. His behavior is normal.     ED Treatments / Results  Labs (all labs ordered are listed, but only abnormal results are displayed) Labs Reviewed  COMPREHENSIVE METABOLIC PANEL - Abnormal; Notable for the following components:      Result Value   Glucose, Bld 124 (*)    Creatinine, Ser 1.43 (*)    Total Protein <3.0 (*)    Total Bilirubin 1.3 (*)    GFR calc  non Af Amer 49 (*)    GFR calc Af Amer 57 (*)    All other components within normal limits  CBC WITH DIFFERENTIAL/PLATELET - Abnormal; Notable for the  following components:   RBC 4.20 (*)    Hemoglobin 11.6 (*)    HCT 36.2 (*)    RDW 16.1 (*)    Platelets 138 (*)    All other components within normal limits  I-STAT CG4 LACTIC ACID, ED  I-STAT CG4 LACTIC ACID, ED    EKG  EKG Interpretation None       Radiology Dg Chest 2 View  Result Date: 06/14/2017 CLINICAL DATA:  Nausea, vomiting and diarrhea yesterday with headache, productive cough and low-grade fever. EXAM: CHEST  2 VIEW COMPARISON:  04/19/2016 FINDINGS: Borderline cardiomegaly with aortic atherosclerosis, stable in appearance. The patient is status post aortic valvular replacement and left atrial appendage clipping. Median sternotomy sutures are in place. Left-sided pacemaker apparatus with right atrial and right ventricular leads are stable in appearance. No acute pneumonic consolidation, effusion or pneumothorax. IMPRESSION: No active cardiopulmonary disease.  Aortic atherosclerosis. Electronically Signed   By: Ashley Royalty M.D.   On: 06/14/2017 20:02    Procedures Procedures (including critical care time)  Medications Ordered in ED Medications  sodium chloride 0.9 % bolus 250 mL (0 mLs Intravenous Stopped 06/15/17 0317)  albuterol (PROVENTIL) (2.5 MG/3ML) 0.083% nebulizer solution 2.5 mg (2.5 mg Nebulization Given 06/15/17 0306)  methylPREDNISolone sodium succinate (SOLU-MEDROL) 125 mg/2 mL injection 125 mg (125 mg Intravenous Given 06/15/17 0256)  ondansetron (ZOFRAN) 8 mg in sodium chloride 0.9 % 50 mL IVPB (0 mg Intravenous Stopped 06/15/17 0317)  morphine 4 MG/ML injection 4 mg (4 mg Intravenous Given 06/15/17 0256)  dofetilide (TIKOSYN) capsule 250 mcg (250 mcg Oral Given 06/15/17 0306)  apixaban (ELIQUIS) tablet 5 mg (5 mg Oral Given 06/15/17 0305)     Initial Impression / Assessment and Plan / ED Course  I have reviewed the triage vital signs and the nursing notes.  Pertinent labs & imaging results that were available during my care of the patient were reviewed by  me and considered in my medical decision making (see chart for details).   Given nebulized albuterol.  Was given IV Solu-Medrol.  Antiemetics.  Taking p.o.  On reexam is clear lungs.  He is well oxygenated on his 2 L.  Feels comfortable with discharge.  I will treat him with Tamiflu for his probable influenza.  Prescriptions Tamiflu, Tessalon, promethazine DM, Zofran, prednisone.  Return to ER if worsening.  Primary care follow-up if not improving.   Final Clinical Impressions(s) / ED Diagnoses   Final diagnoses:  Influenza-like illness    ED Discharge Orders        Ordered    oseltamivir (TAMIFLU) 75 MG capsule  Every 12 hours     06/15/17 0358    benzonatate (TESSALON) 100 MG capsule  Every 8 hours     06/15/17 0358    promethazine-dextromethorphan (PROMETHAZINE-DM) 6.25-15 MG/5ML syrup  4 times daily PRN     06/15/17 0358    ondansetron (ZOFRAN ODT) 4 MG disintegrating tablet  Every 8 hours PRN     06/15/17 0358    predniSONE (DELTASONE) 20 MG tablet  2 times daily with meals     06/15/17 0358       Tanna Furry, MD 06/15/17 [phone]

## 2017-06-25 ENCOUNTER — Encounter: Payer: Self-pay | Admitting: Internal Medicine

## 2017-06-25 ENCOUNTER — Ambulatory Visit: Payer: Medicare Other | Admitting: Internal Medicine

## 2017-06-25 VITALS — BP 126/62 | HR 61 | Ht 71.0 in | Wt 213.0 lb

## 2017-06-25 DIAGNOSIS — R0602 Shortness of breath: Secondary | ICD-10-CM | POA: Diagnosis not present

## 2017-06-25 DIAGNOSIS — I10 Essential (primary) hypertension: Secondary | ICD-10-CM | POA: Diagnosis not present

## 2017-06-25 DIAGNOSIS — I481 Persistent atrial fibrillation: Secondary | ICD-10-CM

## 2017-06-25 DIAGNOSIS — I4819 Other persistent atrial fibrillation: Secondary | ICD-10-CM

## 2017-06-25 DIAGNOSIS — I441 Atrioventricular block, second degree: Secondary | ICD-10-CM | POA: Diagnosis not present

## 2017-06-25 LAB — CUP PACEART INCLINIC DEVICE CHECK
Battery Remaining Longevity: 55 mo
Battery Voltage: 2.87 V
Brady Statistic RA Percent Paced: 51 %
Brady Statistic RV Percent Paced: 99.63 %
Date Time Interrogation Session: 20190227190934
Implantable Lead Implant Date: 20131203
Implantable Lead Implant Date: 20131203
Implantable Lead Location: 753859
Implantable Lead Location: 753860
Implantable Lead Model: 1948
Implantable Pulse Generator Implant Date: 20131203
Lead Channel Impedance Value: 375 Ohm
Lead Channel Impedance Value: 612.5 Ohm
Lead Channel Pacing Threshold Amplitude: 0.75 V
Lead Channel Pacing Threshold Amplitude: 1 V
Lead Channel Pacing Threshold Pulse Width: 0.5 ms
Lead Channel Pacing Threshold Pulse Width: 0.8 ms
Lead Channel Sensing Intrinsic Amplitude: 3.3 mV
Lead Channel Sensing Intrinsic Amplitude: 9 mV
Lead Channel Setting Pacing Amplitude: 1.25 V
Lead Channel Setting Pacing Amplitude: 1.75 V
Lead Channel Setting Pacing Pulse Width: 0.8 ms
Lead Channel Setting Sensing Sensitivity: 4 mV
Pulse Gen Model: 2210
Pulse Gen Serial Number: 7408461

## 2017-06-25 NOTE — Patient Instructions (Signed)
Medication Instructions:  Your physician recommends that you continue on your current medications as directed. Please refer to the Current Medication list given to you today.   Labwork: None ordered  Testing/Procedures: None ordered  Follow-Up: Remote monitoring is used to monitor your Pacemaker from home. This monitoring reduces the number of office visits required to check your device to one time per year. It allows [place] to keep an eye on the functioning of your device to ensure it is working properly. You are scheduled for a device check from home on 08/18/17. You may send your transmission at any time that day. If you have a wireless device, the transmission will be sent automatically. After your physician reviews your transmission, you will receive a postcard with your next transmission date.    Your physician wants you to follow-up in: 6 mo with Roderic Palau, NP. You will receive a reminder letter in the mail two months in advance. If you don't receive a letter, please call our office to schedule the follow-up appointment.   Your physician wants you to follow-up in: 1 year with Dr. Rayann Heman. You will receive a reminder letter in the mail two months in advance. If you don't receive a letter, please call our office to schedule the follow-up appointment.   Any Other Special Instructions Will Be Listed Below (If Applicable).     If you need a refill on your cardiac medications before your next appointment, please call your pharmacy.

## 2017-06-25 NOTE — Progress Notes (Signed)
PCP: Velna Hatchet, MD Primary Cardiologist:  Dr Liana Crocker Also followed in Advanced heart failure clinic Primary EP:  Dr Rayann Heman  [name] is a 67 y.o. male who presents today for routine electrophysiology followup.  Since last being seen in our clinic, the patient reports doing very well.  His SOB is stable.  He is on O2 at home.  Today, he denies symptoms of palpitations, chest pain, lower extremity edema, dizziness, presyncope, or syncope.  The patient is otherwise without complaint today.   Past Medical History:  Diagnosis Date  . Aortic stenosis, mild   . Arthritis of hand    "just a little bit in both hands" (03/31/2012)  . Asthma    "little bit" (03/31/2012)  . Atrial fibrillation [facility])    dx '04; DCCV '04, placed on flecainide, failed DCCV 04/2010, flecainide stopped; s/p successful A.fib ablation 01/31/12  . CHF (congestive heart failure) ([place])   . Diabetes mellitus without complication (HCC)    borderline  . DJD (degenerative joint disease)   . Exertional dyspnea   . Fibromyalgia   . First degree AV block    post ablation, ~423ms  . GERD (gastroesophageal reflux disease)   . Heart murmur   . Hyperlipidemia   . Hypertension   . Hypothyroidism    S/P radiation  . Left atrial enlargement    LA size 33mm by echo 11/21/11  . Mitral regurgitation    trivial  . Obesity   . Obstructive sleep apnea    mild by sleep study 2013; pt stated he does not have a machine because "it wasn't bad enough for him to have one"  . Pacemaker 03/31/2012  . Restless leg syndrome   . Second degree AV block   . Visit for monitoring Tikosyn therapy 04/16/2016   Past Surgical History:  Procedure Laterality Date  . AORTIC VALVE REPLACEMENT N/A 08/22/2015   Procedure: AORTIC VALVE REPLACEMENT (AVR);  Surgeon: Ivin Poot, MD;  Location: [place];  Service: Open Heart Surgery;  Laterality: N/A;  . ATRIAL FIBRILLATION ABLATION  01/30/2012   PVI by Dr. Rayann Heman  . ATRIAL FIBRILLATION ABLATION  N/A 01/31/2012   Procedure: ATRIAL FIBRILLATION ABLATION;  Surgeon: Thompson Grayer, MD;  Location: [facility] CATH LAB;  Service: Cardiovascular;  Laterality: N/A;  . BACK SURGERY     X 3  . CARDIAC CATHETERIZATION N/A 08/09/2015   Procedure: Right/Left Heart Cath and Coronary Angiography;  Surgeon: Peter M Martinique, MD;  Location: [place] CV LAB;  Service: Cardiovascular;  Laterality: N/A;  . CARDIAC CATHETERIZATION N/A 02/05/2016   Procedure: Right/Left Heart Cath and Coronary Angiography;  Surgeon: Jettie Booze, MD;  Location: [place] CV LAB;  Service: Cardiovascular;  Laterality: N/A;  . CARDIAC CATHETERIZATION N/A 04/17/2016   Procedure: Right Heart Cath;  Surgeon: Jolaine Artist, MD;  Location: [place] CV LAB;  Service: Cardiovascular;  Laterality: N/A;  . CARDIOVASCULAR STRESS TEST  03/12/2002   EF 48%, NO EVIDENCE OF ISCHEMIA  . CARDIOVERSION  01/2012; 03/31/2012  . CARDIOVERSION N/A 02/25/2012   Procedure: CARDIOVERSION;  Surgeon: Thompson Grayer, MD;  Location: [facility] CATH LAB;  Service: Cardiovascular;  Laterality: N/A;  . CARDIOVERSION N/A 03/31/2012   Procedure: CARDIOVERSION;  Surgeon: Thompson Grayer, MD;  Location: [facility] CATH LAB;  Service: Cardiovascular;  Laterality: N/A;  . CARDIOVERSION N/A 11/23/2015   Procedure: CARDIOVERSION;  Surgeon: Larey Dresser, MD;  Location: [place];  Service: Cardiovascular;  Laterality: N/A;  . CARDIOVERSION N/A  04/08/2016   Procedure: CARDIOVERSION;  Surgeon: Jolaine Artist, MD;  Location: [facility] ENDOSCOPY;  Service: Cardiovascular;  Laterality: N/A;  . CLIPPING OF ATRIAL APPENDAGE N/A 08/22/2015   Procedure: CLIPPING OF ATRIAL APPENDAGE;  Surgeon: Ivin Poot, MD;  Location: [place];  Service: Open Heart Surgery;  Laterality: N/A;  . DOPPLER ECHOCARDIOGRAPHY  03/11/2002   EF 70-75%  . FINGER SURGERY Left    Middle finger  . FINGER TENDON REPAIR  1980's   "right little finger" (03/31/2012)  . INSERT / REPLACE / REMOVE PACEMAKER     [facility]  . LEFT AND RIGHT HEART CATHETERIZATION WITH CORONARY ANGIOGRAM N/A 10/27/2012   Procedure: LEFT AND RIGHT HEART CATHETERIZATION WITH CORONARY ANGIOGRAM;  Surgeon: Laverda Page, MD;  Location: [facility] CATH LAB;  Service: Cardiovascular;  Laterality: N/A;  . LUMBAR DISC SURGERY  1980's X2;  2000's  . MAZE N/A 08/22/2015   Procedure: MAZE;  Surgeon: Ivin Poot, MD;  Location: [place];  Service: Open Heart Surgery;  Laterality: N/A;  . PACEMAKER INSERTION  03/31/2012   STJ Accent DR pacemaker implanted by Dr Rayann Heman  . PERMANENT PACEMAKER INSERTION N/A 03/31/2012   Procedure: PERMANENT PACEMAKER INSERTION;  Surgeon: Thompson Grayer, MD;  Location: [facility] CATH LAB;  Service: Cardiovascular;  Laterality: N/A;  . TEE WITHOUT CARDIOVERSION  01/30/2012   Procedure: TRANSESOPHAGEAL ECHOCARDIOGRAM (TEE);  Surgeon: Larey Dresser, MD;  Location: [place];  Service: Cardiovascular;  Laterality: N/A;  ablation next day  . TEE WITHOUT CARDIOVERSION N/A 08/22/2015   Procedure: TRANSESOPHAGEAL ECHOCARDIOGRAM (TEE);  Surgeon: Ivin Poot, MD;  Location: [place];  Service: Open Heart Surgery;  Laterality: N/A;  . US ECHOCARDIOGRAPHY  08/07/2009   EF 55-60%    ROS- all systems are reviewed and negative except as per HPI above  Current Outpatient Medications  Medication Sig Dispense Refill  . albuterol (PROVENTIL HFA;VENTOLIN HFA) 108 (90 Base) MCG/ACT inhaler Inhale 2 puffs into the lungs every 6 (six) hours as needed for wheezing or shortness of breath. 1 Inhaler 5  . amLODipine (NORVASC) 10 MG tablet Take 0.5 tablets (5 mg total) by mouth daily. 30 tablet 11  . apixaban (ELIQUIS) 5 MG TABS tablet Take 1 tablet (5 mg total) by mouth 2 (two) times daily. 180 tablet 3  . budesonide-formoterol (SYMBICORT) 160-4.5 MCG/ACT inhaler Inhale 2 puffs into the lungs 2 (two) times daily. 1 Inhaler 6  . Cholecalciferol (VITAMIN D) 2000 UNITS CAPS Take 2,000 Units by mouth daily.    [name] dofetilide (TIKOSYN) 250 MCG capsule  TAKE ONE CAPSULE BY MOUTH TWICE DAILY PT MUST MAKE APPT FOR FURTHER REFILLS 60 capsule 0  . fexofenadine (ALLEGRA) 180 MG tablet Take 1 tablet (180 mg total) by mouth daily. 30 tablet 1  . fluticasone (FLONASE) 50 MCG/ACT nasal spray Place into both nostrils 2 (two) times daily as needed for allergies or rhinitis.    [name] gabapentin (NEURONTIN) 300 MG capsule Take 900 mg by mouth at bedtime.   3  . guaiFENesin (MUCINEX) 600 MG 12 hr tablet Take 600 mg by mouth 2 (two) times daily.    [name] levothyroxine (SYNTHROID, LEVOTHROID) 175 MCG tablet Take 175 mcg by mouth daily before breakfast.    . metFORMIN (GLUMETZA) 500 MG (MOD) 24 hr tablet Take 500 mg by mouth daily with breakfast.    . metoprolol (LOPRESSOR) 50 MG tablet Take 1 tablet (50 mg total) by mouth 2 (two) times daily. 180 tablet 3  . ondansetron (  ZOFRAN ODT) 4 MG disintegrating tablet Take 1 tablet (4 mg total) by mouth every 8 (eight) hours as needed for nausea. 6 tablet 0  . pantoprazole (PROTONIX) 40 MG tablet Take 40 mg by mouth daily.    . potassium chloride (K-DUR) 10 MEQ tablet Take 2 tablets (20 mEq total) by mouth 2 (two) times daily. Take an extra 2 tabs when you take Metolazone (Patient taking differently: Take 20 mEq by mouth 2 (two) times daily. ) 360 tablet 3  . promethazine (PHENERGAN) 25 MG tablet Take 25 mg by mouth every 6 (six) hours as needed for nausea or vomiting.    . promethazine-dextromethorphan (PROMETHAZINE-DM) 6.25-15 MG/5ML syrup Take 5 mLs by mouth 4 (four) times daily as needed for cough. 120 mL 0  . rOPINIRole (REQUIP) 1 MG tablet Take 1 tablet (1 mg total) by mouth daily. 90 tablet 2  . rosuvastatin (CRESTOR) 10 MG tablet Take 10 mg by mouth daily.     [name] spironolactone (ALDACTONE) 25 MG tablet Take 1 tablet (25 mg total) by mouth daily. 90 tablet 3  . torsemide (DEMADEX) 20 MG tablet Take 2 tablets (40 mg total) by mouth daily. Please call for office visit [phone] 180 tablet 3  . traMADol (ULTRAM) 50 MG tablet  Take 50 mg by mouth 4 (four) times daily. Reported on 10/17/2015    . traZODone (DESYREL) 50 MG tablet Take 25 mg by mouth at bedtime.    . valACYclovir (VALTREX) 1000 MG tablet Take 1 tablet by mouth every 12 (twelve) hours as needed (fever blisters). Reported on 10/17/2015     No current facility-administered medications for this visit.     Physical Exam: Vitals:   06/25/17 1512  BP: 126/62  Pulse: 61  Weight: 213 lb (96.6 kg)  Height: 5\' 11"  (1.803 m)    GEN- The patient is well appearing, alert and oriented x 3 today. On O2 Head- normocephalic, atraumatic Eyes-  Sclera clear, conjunctiva pink Ears- hearing intact Oropharynx- clear Lungs- Clear to ausculation bilaterally, normal work of breathing Chest- pacemaker pocket is well healed Heart- Regular rate and rhythm, no murmurs, rubs or gallops, PMI not laterally displaced GI- soft, NT, ND, + BS Extremities- no clubbing, cyanosis, or edema  Pacemaker interrogation- reviewed in detail today,  See PACEART report  ekg tracing ordered today is personally reviewed and shows AV paced rhythm, Qtc 533 msec  Assessment and Plan:  1. Symptomatic second heart block Normal pacemaker function See Pace Art report No changes today  2. afib Remains in sinus rhythm On eliquis S/p MAZE and LAA amputation On tikosyn Labs 06/14/17 reviewed  3. Chronic SOB Stable No change required today He has chronic lung disease.   4. HTN Stable No change required today  [place] Follow-up in AF clinic in 6 months I will see in a year  Thompson Grayer MD, [facility] 06/25/2017 3:45 PM

## 2017-07-22 ENCOUNTER — Other Ambulatory Visit (HOSPITAL_COMMUNITY): Payer: Self-pay | Admitting: Internal Medicine

## 2017-08-11 ENCOUNTER — Telehealth: Payer: Self-pay

## 2017-08-11 NOTE — Telephone Encounter (Signed)
The pts wife called and asked me to re-order the pts Tikosyn through [employer].  I called [employer] and s/w Melissa. Per Melissa the pts Tikosyn will arrive in the office with in 7 to 10 days.  Pwer Melissa the pts has 2 refills left. The next one is due on June 22 and the pt will need to re-new in December 2019.  Order# [id number] [employer] Phone#-[phone] ID: [id number]

## 2017-08-14 NOTE — Telephone Encounter (Signed)
Spoke with patients wife and let her know that the patients tikosyn had arrived at the office and that I would place it at the front desk for them to pick up at their convenience. She verbalized her understanding and appreciation.

## 2017-08-18 ENCOUNTER — Ambulatory Visit (INDEPENDENT_AMBULATORY_CARE_PROVIDER_SITE_OTHER): Payer: Medicare Other | Admitting: *Deleted

## 2017-08-18 DIAGNOSIS — I441 Atrioventricular block, second degree: Secondary | ICD-10-CM

## 2017-08-18 NOTE — Progress Notes (Signed)
Remote pacemaker transmission.   

## 2017-08-19 ENCOUNTER — Encounter: Payer: Self-pay | Admitting: Cardiology

## 2017-08-19 LAB — CUP PACEART REMOTE DEVICE CHECK
Date Time Interrogation Session: 20190423133453
Implantable Lead Implant Date: 20131203
Implantable Lead Implant Date: 20131203
Implantable Lead Location: 753859
Implantable Lead Location: 753860
Implantable Lead Model: 1948
Implantable Pulse Generator Implant Date: 20131203
Pulse Gen Model: 2210
Pulse Gen Serial Number: 7408461

## 2017-09-19 ENCOUNTER — Encounter: Payer: Medicare Other | Attending: Physician Assistant | Admitting: Physician Assistant

## 2017-09-19 DIAGNOSIS — J449 Chronic obstructive pulmonary disease, unspecified: Secondary | ICD-10-CM | POA: Insufficient documentation

## 2017-09-19 DIAGNOSIS — I480 Paroxysmal atrial fibrillation: Secondary | ICD-10-CM | POA: Insufficient documentation

## 2017-09-19 DIAGNOSIS — I110 Hypertensive heart disease with heart failure: Secondary | ICD-10-CM | POA: Insufficient documentation

## 2017-09-19 DIAGNOSIS — L97818 Non-pressure chronic ulcer of other part of right lower leg with other specified severity: Secondary | ICD-10-CM | POA: Insufficient documentation

## 2017-09-19 DIAGNOSIS — E11610 Type 2 diabetes mellitus with diabetic neuropathic arthropathy: Secondary | ICD-10-CM | POA: Insufficient documentation

## 2017-09-19 DIAGNOSIS — E1151 Type 2 diabetes mellitus with diabetic peripheral angiopathy without gangrene: Secondary | ICD-10-CM | POA: Insufficient documentation

## 2017-09-19 DIAGNOSIS — E11622 Type 2 diabetes mellitus with other skin ulcer: Secondary | ICD-10-CM | POA: Diagnosis present

## 2017-09-19 DIAGNOSIS — Z881 Allergy status to other antibiotic agents status: Secondary | ICD-10-CM | POA: Diagnosis not present

## 2017-09-19 DIAGNOSIS — I350 Nonrheumatic aortic (valve) stenosis: Secondary | ICD-10-CM | POA: Insufficient documentation

## 2017-09-19 DIAGNOSIS — I5030 Unspecified diastolic (congestive) heart failure: Secondary | ICD-10-CM | POA: Diagnosis not present

## 2017-09-19 DIAGNOSIS — Z888 Allergy status to other drugs, medicaments and biological substances status: Secondary | ICD-10-CM | POA: Diagnosis not present

## 2017-09-19 DIAGNOSIS — Z950 Presence of cardiac pacemaker: Secondary | ICD-10-CM | POA: Diagnosis not present

## 2017-09-19 DIAGNOSIS — Z87891 Personal history of nicotine dependence: Secondary | ICD-10-CM | POA: Insufficient documentation

## 2017-09-19 DIAGNOSIS — I2510 Atherosclerotic heart disease of native coronary artery without angina pectoris: Secondary | ICD-10-CM | POA: Diagnosis not present

## 2017-09-21 NOTE — Progress Notes (Signed)
KAEVION, SINCLAIR ([id number]) Visit Report for 09/19/2017 Abuse/Suicide Risk Screen Details Patient Name: [name], [name] Date of Service: 09/19/2017 9:45 AM Medical Record Number: [id number] Patient Account Number: [id number] Date of Birth/Sex: 11/02/1950 (67 y.o. Male) Treating RN: Roger Shelter Primary Care Provider: Velna Hatchet Other Clinician: Referring Provider: Referral, Self Treating Provider/Extender: STONE III, HOYT Weeks in Treatment: 0 Abuse/Suicide Risk Screen Items Answer ABUSE/SUICIDE RISK SCREEN: Has anyone close to you tried to hurt or harm you recentlyo No Do you feel uncomfortable with anyone in your familyo No Has anyone forced you do things that you didnot want to doo No Do you have any thoughts of harming yourselfo No Patient displays signs or symptoms of abuse and/or neglect. No Electronic Signature(s) Signed: 09/19/2017 4:56:16 PM By: Roger Shelter Entered By: Roger Shelter on 09/19/2017 10:14:49 Sigl, Grier Rocher ([id number]) -------------------------------------------------------------------------------- Activities of Daily Living Details Patient Name: [name] Date of Service: 09/19/2017 9:45 AM Medical Record Number: [id number] Patient Account Number: [id number] Date of Birth/Sex: [birth year] (67 y.o. Male) Treating RN: Roger Shelter Primary Care Provider: Velna Hatchet Other Clinician: Referring Provider: Referral, Self Treating Provider/Extender: STONE III, HOYT Weeks in Treatment: 0 Activities of Daily Living Items Answer Activities of Daily Living (Please select one for each item) Drive Automobile Completely Able Take Medications Completely Able Use Telephone Completely Able Care for Appearance Completely Able Use Toilet Completely Able Bath / Shower Completely Able Dress Self Completely Able Feed Self Completely Able Walk Completely Able Get In / Out Bed Completely Able Housework Completely Able Prepare Meals  Completely [place] for Self Completely Able Electronic Signature(s) Signed: 09/19/2017 4:56:16 PM By: Roger Shelter Entered By: Roger Shelter on 09/19/2017 10:15:13 Tuminello, Grier Rocher ([id number]) -------------------------------------------------------------------------------- Education Assessment Details Patient Name: [name] Date of Service: 09/19/2017 9:45 AM Medical Record Number: [id number] Patient Account Number: [id number] Date of Birth/Sex: May 21, 1950 (67 y.o. Male) Treating RN: Roger Shelter Primary Care Provider: Velna Hatchet Other Clinician: Referring Provider: Referral, Self Treating Provider/Extender: Melburn Hake, HOYT Weeks in Treatment: 0 Primary Learner Assessed: Patient Learning Preferences/Education Level/Primary Language Learning Preference: Explanation Highest Education Level: College or Above Preferred Language: English Cognitive Barrier Assessment/Beliefs Language Barrier: No Translator Needed: No Memory Deficit: No Emotional Barrier: No Cultural/Religious Beliefs Affecting Medical Care: No Physical Barrier Assessment Impaired Vision: Yes Glasses Impaired Hearing: No Decreased Hand dexterity: No Knowledge/Comprehension Assessment Knowledge Level: High Comprehension Level: High Ability to understand written High instructions: Ability to understand verbal High instructions: Motivation Assessment Anxiety Level: Calm Cooperation: Cooperative Education Importance: Acknowledges Need Interest in Health Problems: Asks Questions Perception: Coherent Willingness to Engage in Self- High Management Activities: Readiness to Engage in Self- High Management Activities: Electronic Signature(s) Signed: 09/19/2017 4:56:16 PM By: Roger Shelter Entered By: Roger Shelter on 09/19/2017 10:15:58 [name] ([id number]) -------------------------------------------------------------------------------- Fall  Risk Assessment Details Patient Name: [name] Date of Service: 09/19/2017 9:45 AM Medical Record Number: [id number] Patient Account Number: [id number] Date of Birth/Sex: [birth year] (67 y.o. Male) Treating RN: Roger Shelter Primary Care Provider: Velna Hatchet Other Clinician: Referring Provider: Referral, Self Treating Provider/Extender: Melburn Hake, HOYT Weeks in Treatment: 0 Fall Risk Assessment Items Have you had 2 or more falls in the last 12 monthso 0 No Have you had any fall that resulted in injury in the last 12 monthso 0 No FALL RISK ASSESSMENT: History of falling - immediate or within 3 months 0 No Secondary diagnosis 0 No Ambulatory aid None/bed rest/wheelchair/nurse 0 No Crutches/cane/walker 0 No Furniture  0 No IV Access/Saline Lock 0 No Gait/Training Normal/bed rest/immobile 0 No Weak 0 No Impaired 0 No Mental Status Oriented to own ability 0 No Electronic Signature(s) Signed: 09/19/2017 4:56:16 PM By: Roger Shelter Entered By: Roger Shelter on 09/19/2017 10:16:24 [name] ([id number]) -------------------------------------------------------------------------------- Foot Assessment Details Patient Name: [name] Date of Service: 09/19/2017 9:45 AM Medical Record Number: [id number] Patient Account Number: [id number] Date of Birth/Sex: Sep 06, 1950 (67 y.o. Male) Treating RN: Roger Shelter Primary Care Provider: Velna Hatchet Other Clinician: Referring Provider: Referral, Self Treating Provider/Extender: STONE III, HOYT Weeks in Treatment: 0 Foot Assessment Items Site Locations + = Sensation present, - = Sensation absent, C = Callus, U = Ulcer R = Redness, W = Warmth, M = Maceration, PU = Pre-ulcerative lesion F = Fissure, S = Swelling, D = Dryness Assessment Right: Left: Other Deformity: No No Prior Foot Ulcer: No No Prior Amputation: No No Charcot Joint: No No Ambulatory Status: Ambulatory Without Help Gait:  Steady Electronic Signature(s) Signed: 09/19/2017 4:56:16 PM By: Roger Shelter Entered By: Roger Shelter on 09/19/2017 10:39:41 Doody, Grier Rocher ([id number]) -------------------------------------------------------------------------------- Nutrition Risk Assessment Details Patient Name: [name] Date of Service: 09/19/2017 9:45 AM Medical Record Number: [id number] Patient Account Number: [id number] Date of Birth/Sex: 1950-11-30 (67 y.o. Male) Treating RN: Roger Shelter Primary Care Provider: Velna Hatchet Other Clinician: Referring Provider: Referral, Self Treating Provider/Extender: STONE III, HOYT Weeks in Treatment: 0 Height (in): 71 Weight (lbs): 207 Body Mass Index (BMI): 28.9 Nutrition Risk Assessment Items NUTRITION RISK SCREEN: I have an illness or condition that made me change the kind and/or amount of 0 No food I eat I eat fewer than two meals per day 0 No I eat few fruits and vegetables, or milk products 0 No I have three or more drinks of beer, liquor or wine almost every day 0 No I have tooth or mouth problems that make it hard for me to eat 0 No I don't always have enough money to buy the food I need 0 No I eat alone most of the time 0 No I take three or more different prescribed or over-the-counter drugs a day 0 No Without wanting to, I have lost or gained 10 pounds in the last six months 0 No I am not always physically able to shop, cook and/or feed myself 0 No Nutrition Protocols Good Risk Protocol 0 No interventions needed Moderate Risk Protocol Electronic Signature(s) Signed: 09/19/2017 4:56:16 PM By: Roger Shelter Entered By: Roger Shelter on 09/19/2017 10:16:46

## 2017-09-21 NOTE — Progress Notes (Signed)
[name], [name] ([id number]) Visit Report for 09/19/2017 Chief Complaint Document Details Patient Name: [name] Date of Service: 09/19/2017 9:45 AM Medical Record Number: [id number] Patient Account Number: [id number] Date of Birth/Sex: 15-Nov-1950 (67 y.o. Male) Treating RN: Montey Hora Primary Care Provider: Velna Hatchet Other Clinician: Referring Provider: Referral, Self Treating Provider/Extender: Melburn Hake, HOYT Weeks in Treatment: 0 Information Obtained from: Patient Chief Complaint Right LE ulcer Electronic Signature(s) Signed: 09/19/2017 9:42:33 PM By: Worthy Keeler PA-C Entered By: Worthy Keeler on 09/19/2017 10:40:39 [name] ([id number]) -------------------------------------------------------------------------------- HPI Details Patient Name: [name] Date of Service: 09/19/2017 9:45 AM Medical Record Number: [id number] Patient Account Number: [id number] Date of Birth/Sex: March 08, 1951 (67 y.o. Male) Treating RN: Montey Hora Primary Care Provider: Velna Hatchet Other Clinician: Referring Provider: Referral, Self Treating Provider/Extender: Melburn Hake, HOYT Weeks in Treatment: 0 History of Present Illness HPI Description: 09/19/17 on evaluation today patient presents initially concerning an issue that is been having with his right anterior shin. He tells me that this has been present since last week and that he's had an issue where a small region which has been bleeding fairly significantly will scab over, subsequently get hit or pulled off and bleed fairly significantly, and then with pressure will seal back over. Upon inspection today he actually has a small scab/eschar covering the area but there does not appear to be any evidence of infection or other significant ulcer noted. 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 Obviously the blood thinners do not help in respect to the bleeding. Nonetheless he is not having any pain and according to the conversation that seems to have an undertone between he and his wife it appears that he really did not feel the need to come for this she wanted him evaluated due to the fact that he does have lower extremity swelling and she was concerned about the possibility of a venous leg ulcer. Fortunately there is no evidence of infection at this point in time. Electronic Signature(s) Signed: 09/19/2017 9:42:33 PM By: Worthy Keeler PA-C Entered By: Worthy Keeler on 09/19/2017 17:53:54 [name], [name] -------------------------------------------------------------------------------- Physical Exam Details Patient Name: [name] Date of Service: 09/19/2017 9:45 AM Medical Record Number: [id number] Patient Account Number: [id number] Date of Birth/Sex: 04-14-1951 (67 y.o. Male) Treating RN: Montey Hora Primary Care Provider: Velna Hatchet Other Clinician: Referring Provider: Referral, Self Treating Provider/Extender: STONE III, HOYT Weeks in Treatment: 0 Constitutional sitting or standing blood pressure is within target range for patient.. pulse regular and within target range for patient.[name] respirations regular, non-labored and within target range for patient.[name] temperature within target range for patient.. Well- nourished and well-hydrated in no acute distress. Eyes conjunctiva clear no eyelid edema noted. pupils equal round and reactive to light and accommodation. Ears, Nose, Mouth, and Throat no gross abnormality of ear auricles or external auditory canals. normal hearing noted during conversation. mucus membranes moist. Respiratory normal breathing without difficulty. clear to auscultation bilaterally. Cardiovascular regular rate and  rhythm with normal S1, S2. 2+ dorsalis pedis/posterior tibialis pulses. 1+ pitting edema of the bilateral lower extremities. Gastrointestinal (GI) soft, non-tender, non-distended, +BS. no ventral hernia noted. Musculoskeletal normal gait and posture. no significant deformity or arthritic changes, no loss or range of motion, no clubbing. Psychiatric this patient is able to make decisions and  demonstrates good insight into disease process. Alert and Oriented x 3. pleasant and cooperative. Notes Patient's wound site appears to be a small eschar that when it pops off apparently believes rather profusely and therefore was not messed with much today on my part. Patient has excellent lower Trinity pulses and other than having him a sitter and staining and lower extremity edema which was admittedly mild at this point early in the morning I do not see any significant findings as far as physical exam is concerned. Obviously no debridement was performed today. Electronic Signature(s) Signed: 09/19/2017 9:42:33 PM By: Worthy Keeler PA-C Entered By: Worthy Keeler on 09/19/2017 17:54:45 [name], [name] -------------------------------------------------------------------------------- Physician Orders Details Patient Name: [name] Date of Service: 09/19/2017 9:45 AM Medical Record Number: [id number] Patient Account Number: [id number] Date of Birth/Sex: Jun 29, 1950 (67 y.o. Male) Treating RN: Montey Hora Primary Care Provider: Velna Hatchet Other Clinician: Referring Provider: Referral, Self Treating Provider/Extender: Melburn Hake, HOYT Weeks in Treatment: 0 Verbal / Phone Orders: No Diagnosis Coding ICD-10 Coding Code Description E11.621 Type 2 diabetes mellitus with foot ulcer I48.0 Paroxysmal atrial fibrillation I10 Essential (primary) hypertension I35.0 Nonrheumatic aortic (valve) stenosis Z95.0 Presence of cardiac pacemaker I50.30 Unspecified diastolic (congestive)  heart failure I25.10 Atherosclerotic heart disease of native coronary artery without angina pectoris Wound Cleansing Wound #1 Right,Midline Lower Leg o May Shower, gently pat wound dry prior to applying new dressing. Primary Wound Dressing Wound #1 Right,Midline Lower Leg o Other: - paint with betadine Secondary Dressing Wound #1 Right,Midline Lower Leg o Boardered Foam Dressing - any cover dressing that protects the wound Dressing Change Frequency Wound #1 Right,Midline Lower Leg o Change dressing every day. Follow-up Appointments Wound #1 Right,Midline Lower Leg o Return Appointment in 2 weeks. Additional Orders / Instructions Wound #1 Right,Midline Lower Leg o Vitamin A; Vitamin C, Zinc - Please add a multivitamin with 100% of vitamin A, vitamin C and zinc suppements o Increase protein intake. Electronic Signature(s) Signed: 09/19/2017 5:24:01 PM By: Montey Hora Signed: 09/19/2017 9:42:33 PM By: Irean Hong Bower, Maxi E. ([id number]) Entered By: Montey Hora on 09/19/2017 10:46:24 [name] ([id number]) -------------------------------------------------------------------------------- Problem List Details Patient Name: [name] Date of Service: 09/19/2017 9:45 AM Medical Record Number: [id number] Patient Account Number: [id number] Date of Birth/Sex: 04-06-1951 (67 y.o. Male) Treating RN: Montey Hora Primary Care Provider: Velna Hatchet Other Clinician: Referring Provider: Referral, Self Treating Provider/Extender: Melburn Hake, HOYT Weeks in Treatment: 0 Active Problems ICD-10 Impacting Encounter Code Description Active Date Wound Healing Diagnosis E11.622 Type 2 diabetes mellitus with other skin ulcer 09/19/2017 Yes L97.818 Non-pressure chronic ulcer of other part of right lower leg with 09/19/2017 Yes other specified severity I48.0 Paroxysmal atrial fibrillation 09/19/2017 Yes I10 Essential (primary) hypertension 09/19/2017  Yes I35.0 Nonrheumatic aortic (valve) stenosis 09/19/2017 Yes Z95.0 Presence of cardiac pacemaker 09/19/2017 Yes I50.30 Unspecified diastolic (congestive) heart failure 09/19/2017 Yes I25.10 Atherosclerotic heart disease of native coronary artery 09/19/2017 Yes without angina pectoris Inactive Problems Resolved Problems Electronic Signature(s) Signed: 09/19/2017 9:42:33 PM By: Irean Hong [place], [place]. ([id number]) Entered By: Worthy Keeler on 09/19/2017 17:52:45 Ptacek, Grier Rocher ([id number]) -------------------------------------------------------------------------------- Progress Note Details Patient Name: [name] Date of Service: 09/19/2017 9:45 AM Medical Record Number: [id number] Patient Account Number: [id number] Date of Birth/Sex: [birth year] (67 y.o. Male) Treating RN: Montey Hora Primary Care Provider: Velna Hatchet Other Clinician: Referring Provider: Referral, Self Treating Provider/Extender: STONE III, HOYT Weeks in Treatment: 0 Subjective Chief  Complaint Information obtained from Patient Right LE ulcer History of Present Illness (HPI) 09/19/17 on evaluation today patient presents initially concerning an issue that is been having with his right anterior shin. He tells me that this has been present since last week and that he's had an issue where a small region which has been bleeding fairly significantly will scab over, subsequently get hit or pulled off and bleed fairly significantly, and then with pressure will seal back over. Upon inspection today he actually has a small scab/eschar covering the area but there does not appear to be any evidence of infection or other significant ulcer noted. 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 Obviously the blood thinners do not help in respect to the bleeding. Nonetheless he is not having any pain and according to the conversation that seems to have an undertone between he and his wife it appears that he really did not feel the need to come for this she wanted him evaluated due to the fact that he does have lower extremity swelling and she was concerned about the possibility of a venous leg ulcer. Fortunately there is no evidence of infection at this point in time. Wound History Patient presents with 1 open wound that has been present for approximately few weeks. Patient has been treating wound in the following manner: nothing. The wound has been healed in the past but has re-opened. Laboratory tests have not been performed in the last month. Patient reportedly has not tested positive for an antibiotic resistant organism. Patient reportedly has not tested positive for osteomyelitis. Patient reportedly has not had testing performed to evaluate circulation in the legs. Patient History Information obtained from Patient. Allergies meloxicam, vancomycin Family History Cancer - Mother, Diabetes - Siblings, Heart Disease - Mother, Hypertension - Mother,Siblings, Stroke - Mother,Father,Siblings, No family history of Kidney Disease, Lung Disease, Seizures, Thyroid Problems, Tuberculosis. Social History Former smoker, Marital Status - Married, Alcohol Use - Rarely, Drug Use - No History, Caffeine Use - Rarely. Medical History Eyes Denies history of Cataracts, Glaucoma, Optic Neuritis Ear/Nose/Mouth/Throat Denies history of Chronic sinus problems/congestion, Middle ear problems Hematologic/Lymphatic Denies history of Anemia, Hemophilia, Human Immunodeficiency Virus, Lymphedema, Sickle Cell Disease Kroft, Ripley E. ([id number]) Respiratory Patient has history of Asthma, Chronic Obstructive Pulmonary Disease (COPD) Denies history of Aspiration, Pneumothorax, Sleep Apnea,  Tuberculosis Cardiovascular Patient has history of Arrhythmia - a fib, Hypertension Denies history of Angina, Congestive Heart Failure, Coronary Artery Disease, Deep Vein Thrombosis, Hypotension, Myocardial Infarction, Peripheral Arterial Disease, Peripheral Venous Disease, Phlebitis, Vasculitis Gastrointestinal Denies history of Cirrhosis , Colitis, Crohn s, Hepatitis A, Hepatitis B, Hepatitis C Endocrine Patient has history of Type II Diabetes Genitourinary Denies history of End Stage Renal Disease Immunological Denies history of Lupus Erythematosus, Raynaud s, Scleroderma Musculoskeletal Denies history of Gout, Rheumatoid Arthritis, Osteoarthritis, Osteomyelitis Neurologic Denies history of Dementia, Neuropathy, Quadriplegia, Paraplegia, Seizure Disorder Psychiatric Denies history of Anorexia/bulimia, Confinement Anxiety Patient is treated with Oral Agents. Blood sugar is not tested. Medical And Surgical History Notes Respiratory emphysema Review of Systems (ROS) Constitutional Symptoms (General Health) Denies complaints or symptoms of Fatigue, Fever, Chills, Marked Weight Change. Eyes Complains or has symptoms of Dry Eyes, Glasses / Contacts - glasses. Denies complaints or symptoms of Vision Changes. Ear/Nose/Mouth/Throat Denies complaints or symptoms of Difficult clearing ears, Sinusitis. Hematologic/Lymphatic Denies complaints or symptoms of  Bleeding / Clotting Disorders, Human Immunodeficiency Virus. Respiratory Complains or has symptoms of Shortness of Breath - with exertion. Cardiovascular Complains or has symptoms of LE edema. Denies complaints or symptoms of Chest pain. Gastrointestinal Denies complaints or symptoms of Frequent diarrhea, Nausea, Vomiting. Endocrine Complains or has symptoms of Thyroid disease. Denies complaints or symptoms of Hepatitis, Polydypsia (Excessive Thirst). Genitourinary Denies complaints or symptoms of Kidney failure/ Dialysis,  Incontinence/dribbling. Immunological Denies complaints or symptoms of Hives, Itching. Integumentary (Skin) Complains or has symptoms of Wounds, Bleeding or bruising tendency. Denies complaints or symptoms of Breakdown, Swelling. Musculoskeletal Complains or has symptoms of Muscle Pain, Muscle Weakness. Neurologic Complains or has symptoms of Numbness/parasthesias - feet and toes. Oncologic KAYE, MITRO ([id number]) The patient has no complaints or symptoms. Psychiatric Denies complaints or symptoms of Anxiety, Claustrophobia. Objective Constitutional sitting or standing blood pressure is within target range for patient.. pulse regular and within target range for patient.[name] respirations regular, non-labored and within target range for patient.[name] temperature within target range for patient.. Well- nourished and well-hydrated in no acute distress. Vitals Time Taken: 9:59 AM, Height: 71 in, Source: Stated, Weight: 207 lbs, Source: Measured, BMI: 28.9, Temperature: 98.2 F, Pulse: 59 bpm, Respiratory Rate: 16 breaths/min, Blood Pressure: 121/63 mmHg. Eyes conjunctiva clear no eyelid edema noted. pupils equal round and reactive to light and accommodation. Ears, Nose, Mouth, and Throat no gross abnormality of ear auricles or external auditory canals. normal hearing noted during conversation. mucus membranes moist. Respiratory normal breathing without difficulty. clear to auscultation bilaterally. Cardiovascular regular rate and rhythm with normal S1, S2. 2+ dorsalis pedis/posterior tibialis pulses. 1+ pitting edema of the bilateral lower extremities. Gastrointestinal (GI) soft, non-tender, non-distended, +BS. no ventral hernia noted. Musculoskeletal normal gait and posture. no significant deformity or arthritic changes, no loss or range of motion, no clubbing. Psychiatric this patient is able to make decisions and demonstrates good insight into disease process. Alert and Oriented x  3. pleasant and cooperative. General Notes: Patient's wound site appears to be a small eschar that when it pops off apparently believes rather profusely and therefore was not messed with much today on my part. Patient has excellent lower Trinity pulses and other than having him a sitter and staining and lower extremity edema which was admittedly mild at this point early in the morning I do not see any significant findings as far as physical exam is concerned. Obviously no debridement was performed today. Integumentary (Hair, Skin) Wound #1 status is Open. Original cause of wound was Gradually Appeared. The wound is located on the Right,Midline Lower Leg. The wound measures 0.5cm length x 0.4cm width x 0.1cm depth; 0.157cm^2 area and 0.016cm^3 volume. There is no tunneling or undermining noted. There is a none present amount of drainage noted. The wound margin is flat and intact. There is no granulation within the wound bed. There is a large (67-100%) amount of necrotic tissue within the wound bed including Eschar. The periwound skin appearance did not exhibit: Callus, Crepitus, Excoriation, Induration, Rash, Scarring, Mcfarland, Jayton E. ([id number]) Dry/Scaly, Maceration, Atrophie Blanche, Cyanosis, Ecchymosis, Hemosiderin Staining, Mottled, Pallor, Rubor, Erythema. Assessment Active Problems ICD-10 E11.622 - Type 2 diabetes mellitus with other skin ulcer L97.818 - Non-pressure chronic ulcer of other part of right lower leg with other specified severity I48.0 - Paroxysmal atrial fibrillation I10 - Essential (primary) hypertension I35.0 - Nonrheumatic aortic (valve) stenosis Z95.0 - Presence of cardiac pacemaker I50.30 - Unspecified diastolic (congestive) heart failure I25.10 - Atherosclerotic heart disease of native  coronary artery without angina pectoris Plan Wound Cleansing: Wound #1 Right,Midline Lower Leg: May Shower, gently pat wound dry prior to applying new dressing. Primary Wound  Dressing: Wound #1 Right,Midline Lower Leg: Other: - paint with betadine Secondary Dressing: Wound #1 Right,Midline Lower Leg: Boardered Foam Dressing - any cover dressing that protects the wound Dressing Change Frequency: Wound #1 Right,Midline Lower Leg: Change dressing every day. Follow-up Appointments: Wound #1 Right,Midline Lower Leg: Return Appointment in 2 weeks. Additional Orders / Instructions: Wound #1 Right,Midline Lower Leg: Vitamin A; Vitamin C, Zinc - Please add a multivitamin with 100% of vitamin A, vitamin C and zinc suppements Increase protein intake. At this time I think that the biggest issue that we need to be careful of is trying to protect this area so that the eschar will not pop off causing additional bleeding. We need to try to keep this intact as long as possible to allow everything to heal underneath and then subsequently once healed hopefully it will stay that way and not be open yet again. I explained that is likely a blood vessel at the surface more specifically ordering based on how he describes the bleeding occurring in spurts whenever it happens that is just under the skin and prone to opening when struck or otherwise injured. He understands. We are gonna keep things under protection for the next week and will see were things stand actually two weeks time when I see him for reevaluation. ADRIEL, KESSEN ([id number]) Please see above for specific wound care orders. We will see patient for re-evaluation in 1 week(s) here in the clinic. If anything worsens or changes patient will contact our office for additional recommendations. Electronic Signature(s) Signed: 09/19/2017 9:42:33 PM By: Worthy Keeler PA-C Entered By: Worthy Keeler on 09/19/2017 17:55:45 Seiber, Grier Rocher ([id number]) -------------------------------------------------------------------------------- ROS/PFSH Details Patient Name: [name] Date of Service: 09/19/2017 9:45  AM Medical Record Number: [id number] Patient Account Number: [id number] Date of Birth/Sex: Jun 07, 1950 (67 y.o. Male) Treating RN: Roger Shelter Primary Care Provider: Velna Hatchet Other Clinician: Referring Provider: Referral, Self Treating Provider/Extender: STONE III, HOYT Weeks in Treatment: 0 Information Obtained From Patient Wound History Do you currently have one or more open woundso Yes How many open wounds do you currently haveo 1 Approximately how long have you had your woundso few weeks How have you been treating your wound(s) until nowo nothing Has your wound(s) ever healed and then re-openedo Yes Have you had any lab work done in the past montho No Have you tested positive for an antibiotic resistant organism (MRSA, VRE)o No Have you tested positive for osteomyelitis (bone infection)o No Have you had any tests for circulation on your legso No Constitutional Symptoms (General Health) Complaints and Symptoms: Negative for: Fatigue; Fever; Chills; Marked Weight Change Eyes Complaints and Symptoms: Positive for: Dry Eyes; Glasses / Contacts - glasses Negative for: Vision Changes Medical History: Negative for: Cataracts; Glaucoma; Optic Neuritis Ear/Nose/Mouth/Throat Complaints and Symptoms: Negative for: Difficult clearing ears; Sinusitis Medical History: Negative for: Chronic sinus problems/congestion; Middle ear problems Hematologic/Lymphatic Complaints and Symptoms: Negative for: Bleeding / Clotting Disorders; Human Immunodeficiency Virus Medical History: Negative for: Anemia; Hemophilia; Human Immunodeficiency Virus; Lymphedema; Sickle Cell Disease Respiratory Complaints and Symptoms: Positive for: Shortness of Breath - with exertion Medical History: Positive for: Asthma; Chronic Obstructive Pulmonary Disease (COPD) Calender, Kash E. ([id number]) Negative for: Aspiration; Pneumothorax; Sleep Apnea; Tuberculosis Past Medical History  Notes: emphysema Cardiovascular Complaints and Symptoms: Positive for: LE edema Negative for: Chest  pain Medical History: Positive for: Arrhythmia - a fib; Hypertension Negative for: Angina; Congestive Heart Failure; Coronary Artery Disease; Deep Vein Thrombosis; Hypotension; Myocardial Infarction; Peripheral Arterial Disease; Peripheral Venous Disease; Phlebitis; Vasculitis Gastrointestinal Complaints and Symptoms: Negative for: Frequent diarrhea; Nausea; Vomiting Medical History: Negative for: Cirrhosis ; Colitis; Crohnos; Hepatitis A; Hepatitis B; Hepatitis C Endocrine Complaints and Symptoms: Positive for: Thyroid disease Negative for: Hepatitis; Polydypsia (Excessive Thirst) Medical History: Positive for: Type II Diabetes Time with diabetes: 2 years Treated with: Oral agents Blood sugar tested every day: No Genitourinary Complaints and Symptoms: Negative for: Kidney failure/ Dialysis; Incontinence/dribbling Medical History: Negative for: End Stage Renal Disease Immunological Complaints and Symptoms: Negative for: Hives; Itching Medical History: Negative for: Lupus Erythematosus; Raynaudos; Scleroderma Integumentary (Skin) Complaints and Symptoms: Positive for: Wounds; Bleeding or bruising tendency Negative for: Breakdown; Swelling Musculoskeletal Schmieg, Orvin E. ([id number]) Complaints and Symptoms: Positive for: Muscle Pain; Muscle Weakness Medical History: Negative for: Gout; Rheumatoid Arthritis; Osteoarthritis; Osteomyelitis Neurologic Complaints and Symptoms: Positive for: Numbness/parasthesias - feet and toes Medical History: Negative for: Dementia; Neuropathy; Quadriplegia; Paraplegia; Seizure Disorder Psychiatric Complaints and Symptoms: Negative for: Anxiety; Claustrophobia Medical History: Negative for: Anorexia/bulimia; Confinement Anxiety Oncologic Complaints and Symptoms: No Complaints or Symptoms Immunizations Pneumococcal  Vaccine: Received Pneumococcal Vaccination: No Implantable Devices Family and Social History Cancer: Yes - Mother; Diabetes: Yes - Siblings; Heart Disease: Yes - Mother; Hypertension: Yes - Mother,Siblings; Kidney Disease: No; Lung Disease: No; Seizures: No; Stroke: Yes - Mother,Father,Siblings; Thyroid Problems: No; Tuberculosis: No; Former smoker; Marital Status - Married; Alcohol Use: Rarely; Drug Use: No History; Caffeine Use: Rarely; Financial Concerns: No; Food, Clothing or Shelter Needs: No; Support System Lacking: No; Transportation Concerns: No; Advanced Directives: No; Patient does not want information on Advanced Directives; Living Will: No; Medical Power of Attorney: No Electronic Signature(s) Signed: 09/19/2017 4:56:16 PM By: Roger Shelter Signed: 09/19/2017 9:42:33 PM By: Worthy Keeler PA-C Entered By: Roger Shelter on 09/19/2017 10:14:32 [name] ([id number]) -------------------------------------------------------------------------------- SuperBill Details Patient Name: [name] Date of Service: 09/19/2017 Medical Record Number: [id number] Patient Account Number: [id number] Date of Birth/Sex: 11/27/1950 (67 y.o. 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 I50.30 Unspecified diastolic (congestive) heart failure I25.10 Atherosclerotic heart disease of native coronary artery without angina pectoris Facility Procedures CPT4 Code: 29937169 Description: 99214  - WOUND CARE VISIT-LEV 4 EST PT Modifier: Quantity: 1 Physician Procedures CPT4 Code Description: [id number] WC PHYS LEVEL 3 o NEW PT ICD-10 Diagnosis Description E11.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

## 2017-09-21 NOTE — Progress Notes (Signed)
[name] ([id number]) Visit Report for 09/19/2017 Allergy List Details Patient Name: [name] Date of Service: 09/19/2017 9:45 AM Medical Record Number: [id number] Patient Account Number: [id number] Date of Birth/Sex: Sep 17, 1950 (67 y.o. Male) Treating RN: Roger Shelter Primary Care Provider: Velna Hatchet Other Clinician: Referring Provider: Referral, Self Treating Provider/Extender: STONE III, HOYT Weeks in Treatment: 0 Allergies Active Allergies meloxicam vancomycin Allergy Notes Electronic Signature(s) Signed: 09/19/2017 4:56:16 PM By: Roger Shelter Entered By: Roger Shelter on 09/19/2017 10:38:57 [name] ([id number]) -------------------------------------------------------------------------------- Arrival Information Details Patient Name: [name] Date of Service: 09/19/2017 9:45 AM Medical Record Number: [id number] Patient Account Number: [id number] Date of Birth/Sex: [birth year] (67 y.o. Male) Treating RN: Roger Shelter Primary Care Provider: Velna Hatchet Other Clinician: Referring Provider: Referral, Self Treating Provider/Extender: Melburn Hake, HOYT Weeks in Treatment: 0 Visit Information Patient Arrived: Ambulatory Arrival Time: 09:58 Accompanied By: wife Transfer Assistance: None Patient Identification Verified: Yes Secondary Verification Process Yes Completed: Patient Has Alerts: Yes Patient Alerts: Patient on Blood Thinner Eliquis Electronic Signature(s) Signed: 09/19/2017 5:24:01 PM By: Montey Hora Entered By: Montey Hora on 09/19/2017 10:43:39 [name], [name] ([id number]) -------------------------------------------------------------------------------- Clinic Level of Care Assessment Details Patient Name: [name] Date of Service: 09/19/2017 9:45 AM Medical Record Number: [id number] Patient Account Number: [id number] Date of Birth/Sex: [birth year] (67 y.o. Male) Treating RN: Montey Hora Primary  Care Provider: Velna Hatchet Other Clinician: Referring Provider: Referral, Self Treating Provider/Extender: STONE III, HOYT Weeks in Treatment: 0 Clinic Level of Care Assessment Items TOOL 2 Quantity Score []  - Use when only an EandM is performed on the INITIAL visit 0 ASSESSMENTS - Nursing Assessment / Reassessment X - General Physical Exam (combine w/ comprehensive assessment (listed just below) when 1 20 performed on new pt. evals) X- 1 25 Comprehensive Assessment (HX, ROS, Risk Assessments, Wounds Hx, etc.) ASSESSMENTS - Wound and Skin Assessment / Reassessment X - Simple Wound Assessment / Reassessment - one wound 1 5 []  - 0 Complex Wound Assessment / Reassessment - multiple wounds []  - 0 Dermatologic / Skin Assessment (not related to wound area) ASSESSMENTS - Ostomy and/or Continence Assessment and Care []  - Incontinence Assessment and Management 0 []  - 0 Ostomy Care Assessment and Management (repouching, etc.) PROCESS - Coordination of Care X - Simple Patient / Family Education for ongoing care 1 15 []  - 0 Complex (extensive) Patient / Family Education for ongoing care []  - 0 Staff obtains Programmer, systems, Records, Test Results / Process Orders []  - 0 Staff telephones HHA, Nursing Homes / Clarify orders / etc []  - 0 Routine Transfer to another Facility (non-emergent condition) []  - 0 Routine Hospital Admission (non-emergent condition) X- 1 15 New Admissions / Biomedical engineer / Ordering NPWT, Apligraf, etc. []  - 0 Emergency Hospital Admission (emergent condition) X- 1 10 Simple Discharge Coordination []  - 0 Complex (extensive) Discharge Coordination PROCESS - Special Needs []  - Pediatric / Minor Patient Management 0 []  - 0 Isolation Patient Management [name], [name]. ([id number]) []  - 0 Hearing / Language / Visual special needs []  - 0 Assessment of Community assistance (transportation, D/C planning, etc.) []  - 0 Additional assistance / Altered  mentation []  - 0 Support Surface(s) Assessment (bed, cushion, seat, etc.) INTERVENTIONS - Wound Cleansing / Measurement X - Wound Imaging (photographs - any number of wounds) 1 5 []  - 0 Wound Tracing (instead of photographs) X- 1 5 Simple Wound Measurement - one wound []  - 0 Complex Wound Measurement - multiple wounds X- 1 5 Simple  Wound Cleansing - one wound []  - 0 Complex Wound Cleansing - multiple wounds INTERVENTIONS - Wound Dressings X - Small Wound Dressing one or multiple wounds 1 10 []  - 0 Medium Wound Dressing one or multiple wounds []  - 0 Large Wound Dressing one or multiple wounds []  - 0 Application of Medications - injection INTERVENTIONS - Miscellaneous []  - External ear exam 0 []  - 0 Specimen Collection (cultures, biopsies, blood, body fluids, etc.) []  - 0 Specimen(s) / Culture(s) sent or taken to Lab for analysis []  - 0 Patient Transfer (multiple staff / Civil Service fast streamer / Similar devices) []  - 0 Simple Staple / Suture removal (25 or less) []  - 0 Complex Staple / Suture removal (26 or more) []  - 0 Hypo / Hyperglycemic Management (close monitor of Blood Glucose) X- 1 15 Ankle / Brachial Index (ABI) - do not check if billed separately Has the patient been seen at the hospital within the last three years: Yes Total Score: 130 Level Of Care: New/Established - Level 4 Electronic Signature(s) Signed: 09/19/2017 5:24:01 PM By: Montey Hora Entered By: Montey Hora on 09/19/2017 10:46:54 [name], [name] ([id number]) -------------------------------------------------------------------------------- Encounter Discharge Information Details Patient Name: [name] Date of Service: 09/19/2017 9:45 AM Medical Record Number: [id number] Patient Account Number: [id number] Date of Birth/Sex: 12-20-1950 (67 y.o. Male) Treating RN: Cornell Barman Primary Care Provider: Velna Hatchet Other Clinician: Referring Provider: Referral, Self Treating Provider/Extender: Melburn Hake, HOYT Weeks in Treatment: 0 Encounter Discharge Information Items Discharge Condition: Stable Ambulatory Status: Ambulatory Discharge Destination: Home Transportation: Private Auto Accompanied By: wife Schedule Follow-up Appointment: Yes Clinical Summary of Care: Electronic Signature(s) Signed: 09/19/2017 6:21:51 PM By: Gretta Cool, BSN, RN, CWS, Kim RN, BSN Entered By: Gretta Cool, BSN, RN, CWS, Kim on 09/19/2017 11:55:38 [name] ([id number]) -------------------------------------------------------------------------------- Lower Extremity Assessment Details Patient Name: [name] Date of Service: 09/19/2017 9:45 AM Medical Record Number: [id number] Patient Account Number: [id number] Date of Birth/Sex: 1950-07-30 (67 y.o. Male) Treating RN: Roger Shelter Primary Care Provider: Velna Hatchet Other Clinician: Referring Provider: Referral, Self Treating Provider/Extender: STONE III, HOYT Weeks in Treatment: 0 Edema Assessment Assessed: [Left: No] [Right: No] Edema: [Left: No] [Right: No] Vascular Assessment Claudication: Claudication Assessment [Left:None] [Right:None] Pulses: Dorsalis Pedis Palpable: [Left:Yes] [Right:Yes] Posterior Tibial Extremity colors, hair growth, and conditions: Extremity Color: [Left:Hyperpigmented] [Right:Hyperpigmented] Hair Growth on Extremity: [Left:Yes] [Right:Yes] Temperature of Extremity: [Left:Warm] [Right:Warm] Capillary Refill: [Left:< 3 seconds] [Right:< 3 seconds] Blood Pressure: Brachial: [Left:118] [Right:121] Dorsalis Pedis: 132 [Left:Dorsalis Pedis: 124] Ankle: Posterior Tibial: 120 [Left:Posterior Tibial: 122 1.09] [Right:1.02] Toe Nail Assessment Left: Right: Thick: No No Discolored: No No Deformed: No No Improper Length and Hygiene: No No Electronic Signature(s) Signed: 09/19/2017 4:56:16 PM By: Roger Shelter Entered By: Roger Shelter on 09/19/2017 10:31:27 [name], [name]  ([id number]) -------------------------------------------------------------------------------- Multi Wound Chart Details Patient Name: [name] Date of Service: 09/19/2017 9:45 AM Medical Record Number: [id number] Patient Account Number: [id number] Date of Birth/Sex: October 25, 1950 (67 y.o. Male) Treating RN: Montey Hora Primary Care Provider: Velna Hatchet Other Clinician: Referring Provider: Referral, Self Treating Provider/Extender: STONE III, HOYT Weeks in Treatment: 0 Vital Signs Height(in): 71 Pulse(bpm): 48 Weight(lbs): 207 Blood Pressure(mmHg): 121/63 Body Mass Index(BMI): 29 Temperature(F): 98.2 Respiratory Rate 16 (breaths/min): Photos: [1:No Photos] [N/A:N/A] Wound Location: [1:Right Lower Leg - Midline] [N/A:N/A] Wounding Event: [1:Gradually Appeared] [N/A:N/A] Primary Etiology: [1:To be determined] [N/A:N/A] Comorbid History: [1:Asthma, Chronic Obstructive Pulmonary Disease (COPD), Arrhythmia, Hypertension, Type II Diabetes] [N/A:N/A] Date Acquired: [1:09/05/2017] [N/A:N/A] Weeks of Treatment: [1:0] [  N/A:N/A] Wound Status: [1:Open] [N/A:N/A] Measurements L x W x D [1:0.5x0.4x0.1] [N/A:N/A] (cm) Area (cm) : [1:0.157] [N/A:N/A] Volume (cm) : [1:0.016] [N/A:N/A] Classification: [1:Partial Thickness] [N/A:N/A] Exudate Amount: [1:None Present] [N/A:N/A] Wound Margin: [1:Flat and Intact] [N/A:N/A] Granulation Amount: [1:None Present (0%)] [N/A:N/A] Necrotic Amount: [1:Large (67-100%)] [N/A:N/A] Necrotic Tissue: [1:Eschar] [N/A:N/A] Exposed Structures: [1:Fascia: No Fat Layer (Subcutaneous Tissue) Exposed: No Tendon: No Muscle: No Joint: No Bone: No] [N/A:N/A] Epithelialization: [1:None] [N/A:N/A] Periwound Skin Texture: [1:Excoriation: No Induration: No Callus: No Crepitus: No Rash: No Scarring: No] [N/A:N/A] Periwound Skin Moisture: [1:Maceration: No Dry/Scaly: No] [N/A:N/A] Periwound Skin Color: Atrophie Blanche: No N/A N/A Cyanosis: No Ecchymosis:  No Erythema: No Hemosiderin Staining: No Mottled: No Pallor: No Rubor: No Tenderness on Palpation: No N/A N/A Wound Preparation: Ulcer Cleansing: N/A N/A Rinsed/Irrigated with Saline Topical Anesthetic Applied: Other: lidocaine 4% Treatment Notes Electronic Signature(s) Signed: 09/19/2017 5:24:01 PM By: Montey Hora Entered By: Montey Hora on 09/19/2017 10:43:49 [name], [name] ([id number]) -------------------------------------------------------------------------------- [place] Details Patient Name: [name] Date of Service: 09/19/2017 9:45 AM Medical Record Number: [id number] Patient Account Number: [id number] Date of Birth/Sex: 07-Oct-1950 (67 y.o. Male) Treating RN: Montey Hora Primary Care Provider: Velna Hatchet Other Clinician: Referring Provider: Referral, Self Treating Provider/Extender: Melburn Hake, HOYT Weeks in Treatment: 0 Active Inactive ` Abuse / Safety / Falls / Self Care Management Nursing Diagnoses: Impaired physical mobility Goals: Patient will not experience any injury related to falls Date Initiated: 09/19/2017 Target Resolution Date: 12/06/2017 Goal Status: Active Interventions: Assess fall risk on admission and as needed Notes: ` Orientation to the Wound Care Program Nursing Diagnoses: Knowledge deficit related to the wound healing center program Goals: Patient/caregiver will verbalize understanding of the [place] Date Initiated: 09/19/2017 Target Resolution Date: 12/06/2017 Goal Status: Active Interventions: Provide education on orientation to the wound center Notes: ` Wound/Skin Impairment Nursing Diagnoses: Impaired tissue integrity Goals: Ulcer/skin breakdown will heal within 14 weeks Date Initiated: 09/19/2017 Target Resolution Date: 12/06/2017 Goal Status: Active Interventions: [name], [name] ([id number]) Assess patient/caregiver ability to obtain necessary supplies Assess  patient/caregiver ability to perform ulcer/skin care regimen upon admission and as needed Assess ulceration(s) every visit Notes: Electronic Signature(s) Signed: 09/19/2017 5:24:01 PM By: Montey Hora Entered By: Montey Hora on 09/19/2017 10:43:08 [name] ([id number]) -------------------------------------------------------------------------------- Pain Assessment Details Patient Name: [name] Date of Service: 09/19/2017 9:45 AM Medical Record Number: [id number] Patient Account Number: [id number] Date of Birth/Sex: [birth year] (67 y.o. Male) Treating RN: Roger Shelter Primary Care Provider: Velna Hatchet Other Clinician: Referring Provider: Referral, Self Treating Provider/Extender: STONE III, HOYT Weeks in Treatment: 0 Active Problems Location of Pain Severity and Description of Pain Patient Has Paino No Site Locations Pain Management and Medication Current Pain Management: Electronic Signature(s) Signed: 09/19/2017 4:56:16 PM By: Roger Shelter Entered By: Roger Shelter on 09/19/2017 09:59:06 [name], [name] ([id number]) -------------------------------------------------------------------------------- Patient/Caregiver Education Details Patient Name: [name] Date of Service: 09/19/2017 9:45 AM Medical Record Number: [id number] Patient Account Number: [id number] Date of Birth/Gender: 05/26/1950 (67 y.o. Male) Treating RN: Cornell Barman Primary Care Physician: Velna Hatchet Other Clinician: Referring Physician: Referral, Self Treating Physician/Extender: Sharalyn Ink in Treatment: 0 Education Assessment Education Provided To: Patient Education Topics Provided Welcome To The [facility]: Handouts: Welcome To [facility] Methods: Demonstration, Explain/Verbal Responses: State content correctly Wound/Skin Impairment: Handouts: Caring for Your Ulcer, Other: betadine and cover dressing Methods:  Demonstration Responses: State content correctly Electronic Signature(s) Signed: 09/19/2017 6:21:51 PM  By: Gretta Cool, BSN, RN, CWS, Kim RN, BSN Entered By: Gretta Cool, BSN, RN, CWS, Kim on 09/19/2017 11:56:09 [name] ([id number]) -------------------------------------------------------------------------------- Wound Assessment Details Patient Name: [name] Date of Service: 09/19/2017 9:45 AM Medical Record Number: [id number] Patient Account Number: [id number] Date of Birth/Sex: 1951/01/28 (67 y.o. Male) Treating RN: Roger Shelter Primary Care Provider: Velna Hatchet Other Clinician: Referring Provider: Referral, Self Treating Provider/Extender: STONE III, HOYT Weeks in Treatment: 0 Wound Status Wound Number: 1 Primary To be determined Etiology: Wound Location: Right Lower Leg - Midline Wound Open Wounding Event: Gradually Appeared Status: Date Acquired: 09/05/2017 Comorbid Asthma, Chronic Obstructive Pulmonary Weeks Of Treatment: 0 History: Disease (COPD), Arrhythmia, Hypertension, Clustered Wound: No Type II Diabetes Photos Photo Uploaded By: Roger Shelter on 09/19/2017 17:01:42 Wound Measurements Length: (cm) 0.5 Width: (cm) 0.4 Depth: (cm) 0.1 Area: (cm) 0.157 Volume: (cm) 0.016 % Reduction in Area: % Reduction in Volume: Epithelialization: None Tunneling: No Undermining: No Wound Description Classification: Partial Thickness Wound Margin: Flat and Intact Exudate Amount: None Present Foul Odor After Cleansing: No Slough/Fibrino Yes Wound Bed Granulation Amount: None Present (0%) Exposed Structure Necrotic Amount: Large (67-100%) Fascia Exposed: No Necrotic Quality: Eschar Fat Layer (Subcutaneous Tissue) Exposed: No Tendon Exposed: No Muscle Exposed: No Joint Exposed: No Bone Exposed: No Periwound Skin Texture Texture Color No Abnormalities Noted: No No Abnormalities Noted: No [name], Victor E. ([id number]) Callus: No Atrophie Blanche:  No Crepitus: No Cyanosis: No Excoriation: No Ecchymosis: No Induration: No Erythema: No Rash: No Hemosiderin Staining: No Scarring: No Mottled: No Pallor: No Moisture Rubor: No No Abnormalities Noted: No Dry / Scaly: No Maceration: No Wound Preparation Ulcer Cleansing: Rinsed/Irrigated with Saline Topical Anesthetic Applied: Other: lidocaine 4%, Treatment Notes Wound #1 (Right, Midline Lower Leg) 1. Cleansed with: Cleanse wound with antibacterial soap and water 3. Peri-wound Care: Other peri-wound care (specify in notes) 5. Secondary Dressing Applied Bordered Foam Dressing Notes Betadine and bordered foam dressing Electronic Signature(s) Signed: 09/19/2017 4:56:16 PM By: Roger Shelter Entered By: Roger Shelter on 09/19/2017 10:33:35 [name], [name] ([id number]) -------------------------------------------------------------------------------- [place] Details Patient Name: [name] Date of Service: 09/19/2017 9:45 AM Medical Record Number: [id number] Patient Account Number: [id number] Date of Birth/Sex: 05-18-1950 (67 y.o. Male) Treating RN: Roger Shelter Primary Care Provider: Velna Hatchet Other Clinician: Referring Provider: Referral, Self Treating Provider/Extender: STONE III, HOYT Weeks in Treatment: 0 Vital Signs Time Taken: 09:59 Temperature (F): 98.2 Height (in): 71 Pulse (bpm): 59 Source: Stated Respiratory Rate (breaths/min): 16 Weight (lbs): 207 Blood Pressure (mmHg): 121/63 Source: Measured Reference Range: 80 - 120 mg / dl Body Mass Index (BMI): 28.9 Electronic Signature(s) Signed: 09/19/2017 4:56:16 PM By: Roger Shelter Entered By: Roger Shelter on 09/19/2017 10:01:22

## 2017-10-03 ENCOUNTER — Encounter: Payer: Medicare Other | Attending: Nurse Practitioner | Admitting: Nurse Practitioner

## 2017-10-03 DIAGNOSIS — Z823 Family history of stroke: Secondary | ICD-10-CM | POA: Insufficient documentation

## 2017-10-03 DIAGNOSIS — Z87891 Personal history of nicotine dependence: Secondary | ICD-10-CM | POA: Diagnosis not present

## 2017-10-03 DIAGNOSIS — Z833 Family history of diabetes mellitus: Secondary | ICD-10-CM | POA: Insufficient documentation

## 2017-10-03 DIAGNOSIS — Z950 Presence of cardiac pacemaker: Secondary | ICD-10-CM | POA: Insufficient documentation

## 2017-10-03 DIAGNOSIS — I2510 Atherosclerotic heart disease of native coronary artery without angina pectoris: Secondary | ICD-10-CM | POA: Insufficient documentation

## 2017-10-03 DIAGNOSIS — L97919 Non-pressure chronic ulcer of unspecified part of right lower leg with unspecified severity: Secondary | ICD-10-CM | POA: Diagnosis not present

## 2017-10-03 DIAGNOSIS — Z8249 Family history of ischemic heart disease and other diseases of the circulatory system: Secondary | ICD-10-CM | POA: Insufficient documentation

## 2017-10-03 DIAGNOSIS — I480 Paroxysmal atrial fibrillation: Secondary | ICD-10-CM | POA: Insufficient documentation

## 2017-10-03 DIAGNOSIS — I110 Hypertensive heart disease with heart failure: Secondary | ICD-10-CM | POA: Insufficient documentation

## 2017-10-03 DIAGNOSIS — Z809 Family history of malignant neoplasm, unspecified: Secondary | ICD-10-CM | POA: Insufficient documentation

## 2017-10-03 DIAGNOSIS — I350 Nonrheumatic aortic (valve) stenosis: Secondary | ICD-10-CM | POA: Diagnosis not present

## 2017-10-03 DIAGNOSIS — I509 Heart failure, unspecified: Secondary | ICD-10-CM | POA: Diagnosis not present

## 2017-10-03 DIAGNOSIS — J449 Chronic obstructive pulmonary disease, unspecified: Secondary | ICD-10-CM | POA: Insufficient documentation

## 2017-10-03 DIAGNOSIS — E11622 Type 2 diabetes mellitus with other skin ulcer: Secondary | ICD-10-CM | POA: Diagnosis present

## 2017-10-06 NOTE — Progress Notes (Signed)
OLIVE, MOTYKA ([id number]) Visit Report for 10/03/2017 Chief Complaint Document Details Patient Name: [name], [name] Date of Service: 10/03/2017 9:30 AM Medical Record Number: [id number] Patient Account Number: [id number] Date of Birth/Sex: 1950/11/08 (67 y.o. M) Treating RN: Montey Hora Primary Care Provider: Velna Hatchet Other Clinician: Referring Provider: Velna Hatchet Treating Provider/Extender: Cathie Olden in Treatment: 2 Information Obtained from: Patient Chief Complaint Right LE ulcer Electronic Signature(s) Signed: 10/03/2017 10:11:03 AM By: Lawanda Cousins Entered By: Lawanda Cousins on 10/03/2017 10:11:03 [name] ([id number]) -------------------------------------------------------------------------------- HPI Details Patient Name: [name] Date of Service: 10/03/2017 9:30 AM Medical Record Number: [id number] Patient Account Number: [id number] Date of Birth/Sex: [birth year] (67 y.o. M) Treating RN: Montey Hora Primary Care Provider: Velna Hatchet Other Clinician: Referring Provider: Velna Hatchet Treating Provider/Extender: Cathie Olden in Treatment: 2 History of Present Illness HPI Description: 09/19/17 on evaluation today patient presents initially concerning an issue that is been having with his right anterior shin. He tells me that this has been present since last week and that he's had an issue where a small region which has been bleeding fairly significantly will scab over, subsequently get hit or pulled off and bleed fairly significantly, and then with pressure will seal back over. Upon inspection today he actually has a small scab/eschar covering the area but there does not appear to be any evidence of infection or other significant ulcer noted. 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 Obviously the blood thinners do not help in respect to the bleeding. Nonetheless he is not having any pain and according to the conversation that seems to have an undertone between he and his wife it appears that he really did not feel the need to come for this she wanted him evaluated due to the fact that he does have lower extremity swelling and she was concerned about the possibility of a venous leg ulcer. Fortunately there is no evidence of infection at this point in time. 10/03/17-He is here for evaluation for right lower extremity ulcer. He is healed and will be discharged from the wound clinic today Electronic Signature(s) Signed: 10/03/2017 10:11:37 AM By: Lawanda Cousins Entered By: Lawanda Cousins on 10/03/2017 10:11:36 [name] ([id number]) -------------------------------------------------------------------------------- Physician Orders Details Patient Name: [name] Date of Service: 10/03/2017 9:30 AM Medical Record Number: [id number] Patient Account Number: [id number] Date of Birth/Sex: 01-09-1951 (67 y.o. M) Treating RN: Montey Hora Primary Care Provider: Velna Hatchet Other Clinician: Referring Provider: Velna Hatchet Treating Provider/Extender: Cathie Olden in Treatment: 2 Verbal / Phone Orders: No Diagnosis Coding Discharge From [facility] Services o Discharge from [place] Signature(s) Signed: 10/03/2017 10:08:32 AM By: Montey Hora Signed: 10/03/2017 4:45:12 PM By: Lawanda Cousins Entered By: Montey Hora on 10/03/2017 10:08:31 [name] ([id number]) -------------------------------------------------------------------------------- Problem List Details Patient Name: [name] Date of Service: 10/03/2017 9:30 AM Medical Record Number: [id number] Patient Account Number: [id number] Date of Birth/Sex: Oct 02, 1950 (67 y.o. M) Treating RN: Montey Hora Primary Care Provider: Velna Hatchet Other Clinician: Referring Provider: Velna Hatchet Treating Provider/Extender: Cathie Olden in Treatment: 2 Active Problems ICD-10 Impacting Encounter Code Description Active Date Wound Healing Diagnosis E11.622 Type 2 diabetes mellitus with other skin ulcer 09/19/2017 No Yes L97.818 Non-pressure chronic ulcer of other part of right lower leg with 09/19/2017 No Yes other specified  severity I48.0 Paroxysmal atrial fibrillation 09/19/2017 No Yes I10 Essential (primary) hypertension 09/19/2017 No Yes I35.0 Nonrheumatic aortic (valve) stenosis 09/19/2017 No Yes Z95.0 Presence of cardiac pacemaker 09/19/2017 No Yes I50.30 Unspecified diastolic (congestive) heart failure 09/19/2017 No Yes I25.10 Atherosclerotic heart disease of native coronary artery 09/19/2017 No Yes without angina pectoris Inactive Problems Resolved Problems Electronic Signature(s) Signed: 10/03/2017 10:10:35 AM By: Bettey Mare, [place] E. ([id number]) Entered By: Lawanda Cousins on 10/03/2017 10:10:34 [name] ([id number]) -------------------------------------------------------------------------------- Progress Note Details Patient Name: [name] Date of Service: 10/03/2017 9:30 AM Medical Record Number: [id number] Patient Account Number: [id number] Date of Birth/Sex: 02-16-1951 (67 y.o. M) Treating RN: Montey Hora Primary Care Provider: Velna Hatchet Other Clinician: Referring Provider: Velna Hatchet Treating Provider/Extender: Cathie Olden in Treatment: 2 Subjective Chief Complaint Information obtained from Patient Right LE ulcer History of Present Illness (HPI) 09/19/17 on evaluation today patient presents initially concerning an issue that is been having with his right anterior shin. He tells me that this has been present since last week and that he's had an issue where a small region which has been bleeding fairly significantly  will scab over, subsequently get hit or pulled off and bleed fairly significantly, and then with pressure will seal back over. Upon inspection today he actually has a small scab/eschar covering the area but there does not appear to be any evidence of infection or other significant ulcer noted. 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 Obviously the blood thinners do not help in respect to the bleeding. Nonetheless he is not having any pain and according to the conversation that seems to have an undertone between he and his wife it appears that he really did not feel the need to come for this she wanted him evaluated due to the fact that he does have lower extremity swelling and she was concerned about the possibility of a venous leg ulcer. Fortunately there is no evidence of infection at this point in time. 10/03/17-He is here for evaluation for right lower extremity ulcer. He is healed and will be discharged from the wound clinic today Patient History Information obtained from Patient. Family History Cancer - Mother, Diabetes - Siblings, Heart Disease - Mother, Hypertension - Mother,Siblings, Stroke - Mother,Father,Siblings, No family history of Kidney Disease, Lung Disease, Seizures, Thyroid Problems, Tuberculosis. Social History Former smoker, Marital Status - Married, Alcohol Use - Rarely, Drug Use - No History, Caffeine Use - Rarely. Medical And Surgical History Notes Respiratory emphysema Objective Bellin, Cyril E. ([id number]) Constitutional Vitals Time Taken: 9:50 AM, Height: 71 in, Weight: 207 lbs, BMI: 28.9, Temperature: 98.6 F, Pulse: 72 bpm, Respiratory Rate: 16 breaths/min, Blood Pressure: 154/67 mmHg. Integumentary (Hair, Skin) Wound #1 status is Healed -  Epithelialized. Original cause of wound was Gradually Appeared. The wound is located on the Right,Midline Lower Leg. The wound measures 0cm length x 0cm width x 0cm depth; 0cm^2 area and 0cm^3 volume. There is no tunneling or undermining noted. There is a none present amount of drainage noted. The wound margin is flat and intact. There is no granulation within the wound bed. There is a large (67-100%) amount of necrotic tissue within the wound bed including Eschar. The periwound skin appearance did not exhibit: Callus, Crepitus, Excoriation, Induration, Rash, Scarring, Dry/Scaly, Maceration, Atrophie Blanche, Cyanosis, Ecchymosis, Hemosiderin Staining, Mottled, Pallor, Rubor,  Erythema. Periwound temperature was noted as No Abnormality. The periwound has tenderness on palpation. Assessment Active Problems ICD-10 Type 2 diabetes mellitus with other skin ulcer Non-pressure chronic ulcer of other part of right lower leg with other specified severity Paroxysmal atrial fibrillation Essential (primary) hypertension Nonrheumatic aortic (valve) stenosis Presence of cardiac pacemaker Unspecified diastolic (congestive) heart failure Atherosclerotic heart disease of native coronary artery without angina pectoris Plan Discharge From [facility] Services: Discharge from [place] Signature(s) Signed: 10/03/2017 10:12:09 AM By: Lawanda Cousins Entered By: Lawanda Cousins on 10/03/2017 10:12:09 [name] ([id number]) -------------------------------------------------------------------------------- ROS/PFSH Details Patient Name: [name] Date of Service: 10/03/2017 9:30 AM Medical Record Number: [id number] Patient Account Number: [id number] Date of Birth/Sex: 1951/01/26 (67 y.o. M) Treating RN: Montey Hora Primary Care Provider: Velna Hatchet Other Clinician: Referring Provider: Velna Hatchet Treating Provider/Extender: Cathie Olden in Treatment: 2 Information  Obtained From Patient Wound History Do you currently have one or more open woundso Yes How many open wounds do you currently haveo 1 Approximately how long have you had your woundso few weeks How have you been treating your wound(s) until nowo nothing Has your wound(s) ever healed and then re-openedo Yes Have you had any lab work done in the past montho No Have you tested positive for an antibiotic resistant organism (MRSA, VRE)o No Have you tested positive for osteomyelitis (bone infection)o No Have you had any tests for circulation on your legso No Eyes Medical History: Negative for: Cataracts; Glaucoma; Optic Neuritis Ear/Nose/Mouth/Throat Medical History: Negative for: Chronic sinus problems/congestion; Middle ear problems Hematologic/Lymphatic Medical History: Negative for: Anemia; Hemophilia; Human Immunodeficiency Virus; Lymphedema; Sickle Cell Disease Respiratory Medical History: Positive for: Asthma; Chronic Obstructive Pulmonary Disease (COPD) Negative for: Aspiration; Pneumothorax; Sleep Apnea; Tuberculosis Past Medical History Notes: emphysema Cardiovascular Medical History: Positive for: Arrhythmia - a fib; Hypertension Negative for: Angina; Congestive Heart Failure; Coronary Artery Disease; Deep Vein Thrombosis; Hypotension; Myocardial Infarction; Peripheral Arterial Disease; Peripheral Venous Disease; Phlebitis; Vasculitis Gastrointestinal Medical History: Negative for: Cirrhosis ; Colitis; Crohnos; Hepatitis A; Hepatitis B; Hepatitis C Endocrine THIERNO, HUN ([id number]) Medical History: Positive for: Type II Diabetes Time with diabetes: 2 years Treated with: Oral agents Blood sugar tested every day: No Genitourinary Medical History: Negative for: End Stage Renal Disease Immunological Medical History: Negative for: Lupus Erythematosus; Raynaudos; Scleroderma Musculoskeletal Medical History: Negative for: Gout; Rheumatoid Arthritis; Osteoarthritis;  Osteomyelitis Neurologic Medical History: Negative for: Dementia; Neuropathy; Quadriplegia; Paraplegia; Seizure Disorder Psychiatric Medical History: Negative for: Anorexia/bulimia; Confinement Anxiety Immunizations Pneumococcal Vaccine: Received Pneumococcal Vaccination: No Implantable Devices Family and Social History Cancer: Yes - Mother; Diabetes: Yes - Siblings; Heart Disease: Yes - Mother; Hypertension: Yes - Mother,Siblings; Kidney Disease: No; Lung Disease: No; Seizures: No; Stroke: Yes - Mother,Father,Siblings; Thyroid Problems: No; Tuberculosis: No; Former smoker; Marital Status - Married; Alcohol Use: Rarely; Drug Use: No History; Caffeine Use: Rarely; Financial Concerns: No; Food, Clothing or Shelter Needs: No; Support System Lacking: No; Transportation Concerns: No; Advanced Directives: No; Patient does not want information on Advanced Directives; Living Will: No; Medical Power of Attorney: No Physician Affirmation I have reviewed and agree with the above information. Electronic Signature(s) Signed: 10/03/2017 4:22:23 PM By: Montey Hora Signed: 10/03/2017 4:45:12 PM By: Lawanda Cousins Entered By: Lawanda Cousins on 10/03/2017 10:11:46 [name] ([id number]) -------------------------------------------------------------------------------- SuperBill Details Patient Name: [name] Date of Service: 10/03/2017 Medical Record Number: [id number] Patient Account Number: [id number] Date of Birth/Sex: Jul 15, 1950 (67 y.o. M) Treating RN: Montey Hora Primary Care Provider:  Velna Hatchet Other Clinician: Referring Provider: Velna Hatchet Treating Provider/Extender: Cathie Olden in Treatment: 2 Diagnosis Coding ICD-10 Codes Code Description E11.622 Type 2 diabetes mellitus with other skin ulcer L97.818 Non-pressure chronic ulcer of other part of right lower leg with other specified severity I48.0 Paroxysmal atrial fibrillation I10 Essential (primary)  hypertension I35.0 Nonrheumatic aortic (valve) stenosis Z95.0 Presence of cardiac pacemaker I50.30 Unspecified diastolic (congestive) heart failure I25.10 Atherosclerotic heart disease of native coronary artery without angina pectoris Facility Procedures CPT4 Code: [id number] Description: 12811 - WOUND CARE VISIT-LEV 2 EST PT Modifier: Quantity: 1 Physician Procedures CPT4 Code Description: [id number] 36681 - WC PHYS LEVEL 2 - EST PT ICD-10 Diagnosis Description L97.818 Non-pressure chronic ulcer of other part of right lower leg wit Modifier: h other specified Quantity: 1 severity Electronic Signature(s) Signed: 10/03/2017 10:13:00 AM By: Lawanda Cousins Entered By: Lawanda Cousins on 10/03/2017 10:12:59

## 2017-10-06 NOTE — Progress Notes (Signed)
GEOFFREY, HYNES ([id number]) Visit Report for 10/03/2017 Arrival Information Details Patient Name: [name], [name] Date of Service: 10/03/2017 9:30 AM Medical Record Number: [id number] Patient Account Number: [id number] Date of Birth/Sex: [birth year] (67 y.o. M) Treating RN: Roger Shelter Primary Care : Velna Hatchet Other Clinician: Referring : Velna Hatchet Treating /Extender: Cathie Olden in Treatment: 2 Visit Information History Since Last Visit All ordered tests and consults were completed: No Patient Arrived: Ambulatory Added or deleted any medications: No Arrival Time: 09:48 Any new allergies or adverse reactions: No Accompanied By: wife Had a fall or experienced change in No Transfer Assistance: None activities of daily living that may affect Patient Identification Verified: Yes risk of falls: Patient Has Alerts: Yes Signs or symptoms of abuse/neglect since last visito No Patient Alerts: Patient on Blood Thinner Hospitalized since last visit: No Eliquis Implantable device outside of the clinic excluding No cellular tissue based products placed in the center since last visit: Pain Present Now: No Electronic Signature(s) Signed: 10/03/2017 4:27:05 PM By: Roger Shelter Entered By: Roger Shelter on 10/03/2017 09:49:35 [place]all, Grier Rocher ([id number]) -------------------------------------------------------------------------------- Clinic Level of Care Assessment Details Patient Name: [name] Date of Service: 10/03/2017 9:30 AM Medical Record Number: [id number] Patient Account Number: [id number] Date of Birth/Sex: 05-03-1950 (67 y.o. M) Treating RN: Montey Hora Primary Care : Velna Hatchet Other Clinician: Referring : Velna Hatchet Treating /Extender: Cathie Olden in Treatment: 2 Clinic Level of Care Assessment Items TOOL 4 Quantity Score []  - Use when only an EandM is performed on  FOLLOW-UP visit 0 ASSESSMENTS - Nursing Assessment / Reassessment X - Reassessment of Co-morbidities (includes updates in patient status) 1 10 X- 1 5 Reassessment of Adherence to Treatment Plan ASSESSMENTS - Wound and Skin Assessment / Reassessment X - Simple Wound Assessment / Reassessment - one wound 1 5 []  - 0 Complex Wound Assessment / Reassessment - multiple wounds []  - 0 Dermatologic / Skin Assessment (not related to wound area) ASSESSMENTS - Focused Assessment []  - Circumferential Edema Measurements - multi extremities 0 []  - 0 Nutritional Assessment / Counseling / Intervention X- 1 5 Lower Extremity Assessment (monofilament, tuning fork, pulses) []  - 0 Peripheral Arterial Disease Assessment (using hand held doppler) ASSESSMENTS - Ostomy and/or Continence Assessment and Care []  - Incontinence Assessment and Management 0 []  - 0 Ostomy Care Assessment and Management (repouching, etc.) PROCESS - Coordination of Care X - Simple Patient / Family Education for ongoing care 1 15 []  - 0 Complex (extensive) Patient / Family Education for ongoing care []  - 0 Staff obtains Programmer, systems, Records, Test Results / Process Orders []  - 0 Staff telephones HHA, Nursing Homes / Clarify orders / etc []  - 0 Routine Transfer to another Facility (non-emergent condition) []  - 0 Routine Hospital Admission (non-emergent condition) []  - 0 New Admissions / Biomedical engineer / Ordering NPWT, Apligraf, etc. []  - 0 Emergency Hospital Admission (emergent condition) X- 1 10 Simple Discharge Coordination TOSHIYUKI, FREDELL E. ([id number]) []  - 0 Complex (extensive) Discharge Coordination PROCESS - Special Needs []  - Pediatric / Minor Patient Management 0 []  - 0 Isolation Patient Management []  - 0 Hearing / Language / Visual special needs []  - 0 Assessment of Community assistance (transportation, D/C planning, etc.) []  - 0 Additional assistance / Altered mentation []  - 0 Support Surface(s)  Assessment (bed, cushion, seat, etc.) INTERVENTIONS - Wound Cleansing / Measurement X - Simple Wound Cleansing - one wound 1 5 []  - 0 Complex Wound Cleansing - multiple wounds  X- 1 5 Wound Imaging (photographs - any number of wounds) []  - 0 Wound Tracing (instead of photographs) X- 1 5 Simple Wound Measurement - one wound []  - 0 Complex Wound Measurement - multiple wounds INTERVENTIONS - Wound Dressings []  - Small Wound Dressing one or multiple wounds 0 []  - 0 Medium Wound Dressing one or multiple wounds []  - 0 Large Wound Dressing one or multiple wounds []  - 0 Application of Medications - topical []  - 0 Application of Medications - injection INTERVENTIONS - Miscellaneous []  - External ear exam 0 []  - 0 Specimen Collection (cultures, biopsies, blood, body fluids, etc.) []  - 0 Specimen(s) / Culture(s) sent or taken to Lab for analysis []  - 0 Patient Transfer (multiple staff / Civil Service fast streamer / Similar devices) []  - 0 Simple Staple / Suture removal (25 or less) []  - 0 Complex Staple / Suture removal (26 or more) []  - 0 Hypo / Hyperglycemic Management (close monitor of Blood Glucose) []  - 0 Ankle / Brachial Index (ABI) - do not check if billed separately X- 1 5 Vital Signs Fout, Chord E. ([id number]) Has the patient been seen at the hospital within the last three years: Yes Total Score: 70 Level Of Care: New/Established - Level 2 Electronic Signature(s) Signed: 10/03/2017 4:22:23 PM By: Montey Hora Entered By: Montey Hora on 10/03/2017 10:08:57 [name] ([id number]) -------------------------------------------------------------------------------- Encounter Discharge Information Details Patient Name: [name] Date of Service: 10/03/2017 9:30 AM Medical Record Number: [id number] Patient Account Number: [id number] Date of Birth/Sex: Aug 24, 1950 (67 y.o. M) Treating RN: Montey Hora Primary Care : Velna Hatchet Other Clinician: Referring  : Velna Hatchet Treating /Extender: Cathie Olden in Treatment: 2 Encounter Discharge Information Items Discharge Condition: Stable Ambulatory Status: Ambulatory Discharge Destination: Home Transportation: Private Auto Accompanied By: self Schedule Follow-up Appointment: No Clinical Summary of Care: Electronic Signature(s) Signed: 10/03/2017 12:39:54 PM By: Montey Hora Entered By: Montey Hora on 10/03/2017 12:39:54 [name] ([id number]) -------------------------------------------------------------------------------- Lower Extremity Assessment Details Patient Name: [name] Date of Service: 10/03/2017 9:30 AM Medical Record Number: [id number] Patient Account Number: [id number] Date of Birth/Sex: [birth year] (67 y.o. M) Treating RN: Roger Shelter Primary Care : Velna Hatchet Other Clinician: Referring : Velna Hatchet Treating /Extender: Cathie Olden in Treatment: 2 Vascular Assessment Claudication: Claudication Assessment [Right:None] Pulses: Dorsalis Pedis Palpable: [Right:Yes] Posterior Tibial Extremity colors, hair growth, and conditions: Extremity Color: [Right:Hyperpigmented] Temperature of Extremity: [Right:Warm] Capillary Refill: [Right:< 3 seconds] Toe Nail Assessment Left: Right: Thick: No Discolored: No Deformed: No Improper Length and Hygiene: No Electronic Signature(s) Signed: 10/03/2017 4:27:05 PM By: Roger Shelter Entered By: Roger Shelter on 10/03/2017 09:57:22 Taras, Grier Rocher ([id number]) -------------------------------------------------------------------------------- Multi Wound Chart Details Patient Name: [name] Date of Service: 10/03/2017 9:30 AM Medical Record Number: [id number] Patient Account Number: [id number] Date of Birth/Sex: Jan 07, 1951 (67 y.o. M) Treating RN: Montey Hora Primary Care : Velna Hatchet Other Clinician: Referring :  Velna Hatchet Treating /Extender: Cathie Olden in Treatment: 2 Vital Signs Height(in): 71 Pulse(bpm): 104 Weight(lbs): 207 Blood Pressure(mmHg): 154/67 Body Mass Index(BMI): 29 Temperature(F): 98.6 Respiratory Rate 16 (breaths/min): Photos: [1:No Photos] [N/A:N/A] Wound Location: [1:Right, Midline Lower Leg] [N/A:N/A] Wounding Event: [1:Gradually Appeared] [N/A:N/A] Primary Etiology: [1:Trauma, Other] [N/A:N/A] Comorbid History: [1:Asthma, Chronic Obstructive Pulmonary Disease (COPD), Arrhythmia, Hypertension, Type II Diabetes] [N/A:N/A] Date Acquired: [1:09/05/2017] [N/A:N/A] Weeks of Treatment: [1:2] [N/A:N/A] Wound Status: [1:Healed - Epithelialized] [N/A:N/A] Measurements L x W x D [1:0x0x0] [N/A:N/A] (cm) Area (cm) : [1:0] [N/A:N/A] Volume (cm) : [  1:0] [N/A:N/A] % Reduction in Area: [1:100.00%] [N/A:N/A] % Reduction in Volume: [1:100.00%] [N/A:N/A] Classification: [1:Partial Thickness] [N/A:N/A] Exudate Amount: [1:None Present] [N/A:N/A] Wound Margin: [1:Flat and Intact] [N/A:N/A] Granulation Amount: [1:None Present (0%)] [N/A:N/A] Necrotic Amount: [1:Large (67-100%)] [N/A:N/A] Necrotic Tissue: [1:Eschar] [N/A:N/A] Exposed Structures: [1:Fascia: No Fat Layer (Subcutaneous Tissue) Exposed: No Tendon: No Muscle: No Joint: No Bone: No] [N/A:N/A] Epithelialization: [1:None] [N/A:N/A] Periwound Skin Texture: [1:Excoriation: No Induration: No Callus: No Crepitus: No Rash: No Scarring: No] [N/A:N/A] Periwound Skin Moisture: Maceration: No N/A N/A Dry/Scaly: No Periwound Skin Color: Atrophie Blanche: No N/A N/A Cyanosis: No Ecchymosis: No Erythema: No Hemosiderin Staining: No Mottled: No Pallor: No Rubor: No Temperature: No Abnormality N/A N/A Tenderness on Palpation: Yes N/A N/A Wound Preparation: Ulcer Cleansing: N/A N/A Rinsed/Irrigated with Saline Topical Anesthetic Applied: Other: lidocaine 4% Treatment Notes Electronic  Signature(s) Signed: 10/03/2017 10:10:52 AM By: Lawanda Cousins Entered By: Lawanda Cousins on 10/03/2017 10:10:52 [name] ([id number]) -------------------------------------------------------------------------------- Multi-Disciplinary Care Plan Details Patient Name: [name] Date of Service: 10/03/2017 9:30 AM Medical Record Number: [id number] Patient Account Number: [id number] Date of Birth/Sex: 08-12-1950 (67 y.o. M) Treating RN: Montey Hora Primary Care : Velna Hatchet Other Clinician: Referring : Velna Hatchet Treating /Extender: Cathie Olden in Treatment: 2 Active Inactive Electronic Signature(s) Signed: 10/03/2017 10:08:17 AM By: Montey Hora Entered By: Montey Hora on 10/03/2017 10:08:17 [name] ([id number]) -------------------------------------------------------------------------------- Pain Assessment Details Patient Name: [name] Date of Service: 10/03/2017 9:30 AM Medical Record Number: [id number] Patient Account Number: [id number] Date of Birth/Sex: 04/24/1951 (67 y.o. M) Treating RN: Roger Shelter Primary Care : Velna Hatchet Other Clinician: Referring : Velna Hatchet Treating /Extender: Cathie Olden in Treatment: 2 Active Problems Location of Pain Severity and Description of Pain Patient Has Paino No Site Locations Pain Management and Medication Current Pain Management: Electronic Signature(s) Signed: 10/03/2017 4:27:05 PM By: Roger Shelter Entered By: Roger Shelter on 10/03/2017 09:49:47 [place], Grier Rocher ([id number]) -------------------------------------------------------------------------------- Patient/Caregiver Education Details Patient Name: [name] Date of Service: 10/03/2017 9:30 AM Medical Record Number: [id number] Patient Account Number: [id number] Date of Birth/Gender: Jan 01, 1951 (67 y.o. M) Treating RN: Montey Hora Primary Care  Physician: Velna Hatchet Other Clinician: Referring Physician: Velna Hatchet Treating Physician/Extender: Cathie Olden in Treatment: 2 Education Assessment Education Provided To: Patient and Caregiver Education Topics Provided Basic Hygiene: Handouts: Other: care of newly healed ulcer site and need for compression Methods: Explain/Verbal Responses: State content correctly Electronic Signature(s) Signed: 10/03/2017 4:22:23 PM By: Montey Hora Entered By: Montey Hora on 10/03/2017 12:40:20 [name] ([id number]) -------------------------------------------------------------------------------- Wound Assessment Details Patient Name: [name] Date of Service: 10/03/2017 9:30 AM Medical Record Number: [id number] Patient Account Number: [id number] Date of Birth/Sex: Jan 12, 1951 (67 y.o. M) Treating RN: Montey Hora Primary Care : Velna Hatchet Other Clinician: Referring : Velna Hatchet Treating /Extender: Cathie Olden in Treatment: 2 Wound Status Wound Number: 1 Primary Trauma, Other Etiology: Wound Location: Right, Midline Lower Leg Wound Healed - Epithelialized Wounding Event: Gradually Appeared Status: Date Acquired: 09/05/2017 Comorbid Asthma, Chronic Obstructive Pulmonary Weeks Of Treatment: 2 History: Disease (COPD), Arrhythmia, Hypertension, Clustered Wound: No Type II Diabetes Wound Measurements Length: (cm) 0 % Reduct Width: (cm) 0 % Reduct Depth: (cm) 0 Epitheli Area: (cm) 0 Tunneli Volume: (cm) 0 Undermi ion in Area: 100% ion in Volume: 100% alization: None ng: No ning: No Wound Description Classification: Partial Thickness Wound Margin: Flat and Intact Exudate Amount: None Present Foul Odor After Cleansing: No Slough/Fibrino Yes Wound  Bed Granulation Amount: None Present (0%) Exposed Structure Necrotic Amount: Large (67-100%) Fascia Exposed: No Necrotic Quality: Eschar Fat Layer  (Subcutaneous Tissue) Exposed: No Tendon Exposed: No Muscle Exposed: No Joint Exposed: No Bone Exposed: No Periwound Skin Texture Texture Color No Abnormalities Noted: No No Abnormalities Noted: No Callus: No Atrophie Blanche: No Crepitus: No Cyanosis: No Excoriation: No Ecchymosis: No Induration: No Erythema: No Rash: No Hemosiderin Staining: No Scarring: No Mottled: No Pallor: No Moisture Rubor: No No Abnormalities Noted: No Dry / Scaly: No Temperature / Pain Maceration: No Temperature: No Abnormality Tenderness on Palpation: Yes Ojo, Lundy E. ([id number]) Wound Preparation Ulcer Cleansing: Rinsed/Irrigated with Saline Topical Anesthetic Applied: Other: lidocaine 4%, Electronic Signature(s) Signed: 10/03/2017 4:22:23 PM By: Montey Hora Entered By: Montey Hora on 10/03/2017 10:06:22 [name] ([id number]) -------------------------------------------------------------------------------- [place] Details Patient Name: [name] Date of Service: 10/03/2017 9:30 AM Medical Record Number: [id number] Patient Account Number: [id number] Date of Birth/Sex: 25-Jun-1950 (67 y.o. M) Treating RN: Roger Shelter Primary Care : Velna Hatchet Other Clinician: Referring : Velna Hatchet Treating /Extender: Cathie Olden in Treatment: 2 Vital Signs Time Taken: 09:50 Temperature (F): 98.6 Height (in): 71 Pulse (bpm): 72 Weight (lbs): 207 Respiratory Rate (breaths/min): 16 Body Mass Index (BMI): 28.9 Blood Pressure (mmHg): 154/67 Reference Range: 80 - 120 mg / dl Electronic Signature(s) Signed: 10/03/2017 4:27:05 PM By: Roger Shelter Entered By: Roger Shelter on 10/03/2017 09:50:45

## 2017-10-23 ENCOUNTER — Telehealth (HOSPITAL_COMMUNITY): Payer: Self-pay | Admitting: *Deleted

## 2017-10-23 ENCOUNTER — Ambulatory Visit (HOSPITAL_COMMUNITY)
Admission: RE | Admit: 2017-10-23 | Discharge: 2017-10-23 | Disposition: A | Discharge status: Home / Self Care | Payer: Medicare Other | Source: Ambulatory Visit | Attending: Internal Medicine | Admitting: Internal Medicine

## 2017-10-23 VITALS — BP 135/70 | HR 68 | Wt 204.1 lb

## 2017-10-23 DIAGNOSIS — Z7901 Long term (current) use of anticoagulants: Secondary | ICD-10-CM | POA: Diagnosis not present

## 2017-10-23 DIAGNOSIS — I480 Paroxysmal atrial fibrillation: Secondary | ICD-10-CM | POA: Insufficient documentation

## 2017-10-23 DIAGNOSIS — Z823 Family history of stroke: Secondary | ICD-10-CM | POA: Insufficient documentation

## 2017-10-23 DIAGNOSIS — K219 Gastro-esophageal reflux disease without esophagitis: Secondary | ICD-10-CM | POA: Diagnosis not present

## 2017-10-23 DIAGNOSIS — J449 Chronic obstructive pulmonary disease, unspecified: Secondary | ICD-10-CM | POA: Insufficient documentation

## 2017-10-23 DIAGNOSIS — G2581 Restless legs syndrome: Secondary | ICD-10-CM | POA: Insufficient documentation

## 2017-10-23 DIAGNOSIS — E039 Hypothyroidism, unspecified: Secondary | ICD-10-CM | POA: Diagnosis not present

## 2017-10-23 DIAGNOSIS — J9611 Chronic respiratory failure with hypoxia: Secondary | ICD-10-CM | POA: Insufficient documentation

## 2017-10-23 DIAGNOSIS — I5032 Chronic diastolic (congestive) heart failure: Secondary | ICD-10-CM | POA: Insufficient documentation

## 2017-10-23 DIAGNOSIS — R110 Nausea: Secondary | ICD-10-CM | POA: Diagnosis not present

## 2017-10-23 DIAGNOSIS — I10 Essential (primary) hypertension: Secondary | ICD-10-CM | POA: Diagnosis not present

## 2017-10-23 DIAGNOSIS — E785 Hyperlipidemia, unspecified: Secondary | ICD-10-CM | POA: Insufficient documentation

## 2017-10-23 DIAGNOSIS — Z888 Allergy status to other drugs, medicaments and biological substances status: Secondary | ICD-10-CM | POA: Insufficient documentation

## 2017-10-23 DIAGNOSIS — I272 Pulmonary hypertension, unspecified: Secondary | ICD-10-CM | POA: Diagnosis not present

## 2017-10-23 DIAGNOSIS — E119 Type 2 diabetes mellitus without complications: Secondary | ICD-10-CM | POA: Diagnosis not present

## 2017-10-23 DIAGNOSIS — I110 Hypertensive heart disease with heart failure: Secondary | ICD-10-CM | POA: Insufficient documentation

## 2017-10-23 DIAGNOSIS — Z9889 Other specified postprocedural states: Secondary | ICD-10-CM | POA: Diagnosis not present

## 2017-10-23 DIAGNOSIS — I080 Rheumatic disorders of both mitral and aortic valves: Secondary | ICD-10-CM | POA: Diagnosis not present

## 2017-10-23 DIAGNOSIS — Z87891 Personal history of nicotine dependence: Secondary | ICD-10-CM | POA: Insufficient documentation

## 2017-10-23 DIAGNOSIS — Z881 Allergy status to other antibiotic agents status: Secondary | ICD-10-CM | POA: Insufficient documentation

## 2017-10-23 DIAGNOSIS — E669 Obesity, unspecified: Secondary | ICD-10-CM | POA: Diagnosis not present

## 2017-10-23 DIAGNOSIS — Z7984 Long term (current) use of oral hypoglycemic drugs: Secondary | ICD-10-CM | POA: Diagnosis not present

## 2017-10-23 DIAGNOSIS — Z950 Presence of cardiac pacemaker: Secondary | ICD-10-CM | POA: Insufficient documentation

## 2017-10-23 DIAGNOSIS — Z8249 Family history of ischemic heart disease and other diseases of the circulatory system: Secondary | ICD-10-CM | POA: Insufficient documentation

## 2017-10-23 DIAGNOSIS — M797 Fibromyalgia: Secondary | ICD-10-CM | POA: Diagnosis not present

## 2017-10-23 DIAGNOSIS — Z79899 Other long term (current) drug therapy: Secondary | ICD-10-CM | POA: Insufficient documentation

## 2017-10-23 DIAGNOSIS — I2510 Atherosclerotic heart disease of native coronary artery without angina pectoris: Secondary | ICD-10-CM | POA: Insufficient documentation

## 2017-10-23 DIAGNOSIS — G4733 Obstructive sleep apnea (adult) (pediatric): Secondary | ICD-10-CM | POA: Diagnosis not present

## 2017-10-23 DIAGNOSIS — Z79891 Long term (current) use of opiate analgesic: Secondary | ICD-10-CM | POA: Insufficient documentation

## 2017-10-23 DIAGNOSIS — Z79890 Hormone replacement therapy (postmenopausal): Secondary | ICD-10-CM | POA: Insufficient documentation

## 2017-10-23 LAB — BASIC METABOLIC PANEL
Anion gap: 14 (ref 5–15)
BUN: 18 mg/dL (ref 8–23)
CO2: 28 mmol/L (ref 22–32)
Calcium: 9.8 mg/dL (ref 8.9–10.3)
Chloride: 97 mmol/L — ABNORMAL LOW (ref 98–111)
Creatinine, Ser: 2.11 mg/dL — ABNORMAL HIGH (ref 0.61–1.24)
GFR calc Af Amer: 35 mL/min — ABNORMAL LOW (ref >60)
GFR calc non Af Amer: 31 mL/min — ABNORMAL LOW (ref >60)
Glucose, Bld: 216 mg/dL — ABNORMAL HIGH (ref 70–99)
Potassium: 3.7 mmol/L (ref 3.5–5.1)
Sodium: 139 mmol/L (ref 135–145)

## 2017-10-23 LAB — BRAIN NATRIURETIC PEPTIDE: B Natriuretic Peptide: 123.6 pg/mL — ABNORMAL HIGH (ref 0.0–100.0)

## 2017-10-23 NOTE — Patient Instructions (Signed)
Labs today (will call for abnormal results, otherwise no news is good news)  Echocardiogram has been ordered for you, we'll schedule at checkout.  Follow up in 6 months, please call our clinic in October/November to schedule.  [phone], opt 3.

## 2017-10-23 NOTE — Progress Notes (Signed)
Advanced Heart Failure Clinic Note   Primary Care: Velna Hatchet, MD Primary Cardiologist: Dr. Acie Fredrickson Primary HF: Dr. Haroldine Laws   HPI:  [name] is a 67 y.o. 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   Admitted from Dr. Acie Fredrickson office 02/02/16 with severe DOE with any exercise or walking 10-20 feet. Pt admitted and planned for [facility] 02/05/16. Diuresed with IV lasix with rapid improvement of symptoms. Overall diuresed 15.2 L and down 14 lbs. Discharge weight was 205 lbs.  Echo 02/02/16 LVEF 60-65%, Grade 3 DD. Mild AI, Mild MR, RV mildly dilated and severely reduced. RA mildly dilated. PA peak pressure 74 mm Hg. Concerns for restrictive cardiomyopathy.  L/RHC 02/05/16 with no significant CAD. RHC showed significant difference between PCWP (50) and LVEDP (30) suggestive of MS but no MS on Echo. Weight at d/c was 205 pounds.  Echo 02/08/16 LVEF 55-60%, Restrictive physiology, (?)RV normal. No assessment of PA pressures.  Admitted 12/19 -> 04/19/16 for Tikosyn load. RHC completed during that admission as below.   He presents today for regular follow up. Last visit was volume overloaded and given metolazone to take weekly. Only took metolazone for 2 weeks. SOB has improved. + bendopnea. He is SOB with steps and hills. No orthopnea, PND, or edema. No dizziness or CP. No bleeding on eliquis. Decreased appetite and intake due to nausea. Energy okay. Able to work in the yard. Says he feels much better than he has in the bast. Weights trended down since last visit, but does not weigh regularly. Does not take extra torsemide. Limits fluid and salt. No longer eating ice cream all day.    [place] 04/17/16 RA = 13 RV = 66/3/15 PA = 69/23 (42) PCW = 21 (v=38) Fick cardiac output/index = 6.3/2.9 PVR = 3.8 WU Ao sat = 87% (radial artery ABG) PA sat = 43%, 43% SVC sat =  50%   L/RHC 02/05/16 - Mid RCA lesion, 10 %stenosed. - Ost Cx to Prox Cx lesion, 20 %stenosed. - LV end diastolic pressure is moderately elevated. - Hemodynamic findings consistent with severe pulmonary hypertension. - No evidence of discordance on the LV/RV tracing. No evidence of pericardial constriction. - Severly elevated PCWP. - PA sat 51%. CO 6.2 L/min. Cardiac index 2.87. - No significant aortic valve gradient. - There is no aortic valve stenosis.  RHC Procedural Findings: Hemodynamics (mmHg) RA mean 30 RV 87/9 (26) PA 100/56 (72) PCWP 51 LVEDP 31 AO 142/73 (100) Cardiac Output (Fick) 6.21 Cardiac Index (Fick) 2.87 PVR = 3.5 WU    PFTs 4/17  FEV1  1.6 (47%) FVC 2.25 (49%) DLCO 54%   Past Medical History:  Diagnosis Date  . Aortic stenosis, mild   . Arthritis of hand    "just a little bit in both hands" (03/31/2012)  . Asthma    "little bit" (03/31/2012)  . Atrial fibrillation [facility])    dx '04; DCCV '04, placed on flecainide, failed DCCV 04/2010, flecainide stopped; s/p successful A.fib ablation 01/31/12  . CHF (congestive heart failure) ([place])   . Diabetes mellitus without complication (HCC)    borderline  . DJD (degenerative joint disease)   . Exertional dyspnea   . Fibromyalgia   . First degree AV block    post ablation, ~461ms  . GERD (gastroesophageal reflux disease)   . Heart murmur   .  Hyperlipidemia   . Hypertension   . Hypothyroidism    S/P radiation  . Left atrial enlargement    LA size 66mm by echo 11/21/11  . Mitral regurgitation    trivial  . Obesity   . Obstructive sleep apnea    mild by sleep study 2013; pt stated he does not have a machine because "it wasn't bad enough for him to have one"  . Pacemaker 03/31/2012  . Restless leg syndrome   . Second degree AV block   . Visit for monitoring Tikosyn therapy 04/16/2016    Current Outpatient Medications  Medication Sig Dispense Refill  . albuterol (PROVENTIL HFA;VENTOLIN HFA) 108 (90  Base) MCG/ACT inhaler Inhale 2 puffs into the lungs every 6 (six) hours as needed for wheezing or shortness of breath. 1 Inhaler 5  . amLODipine (NORVASC) 10 MG tablet Take 0.5 tablets (5 mg total) by mouth daily. 30 tablet 11  . apixaban (ELIQUIS) 5 MG TABS tablet Take 1 tablet (5 mg total) by mouth 2 (two) times daily. 180 tablet 3  . budesonide-formoterol (SYMBICORT) 160-4.5 MCG/ACT inhaler Inhale 2 puffs into the lungs 2 (two) times daily. 1 Inhaler 6  . Cholecalciferol (VITAMIN D) 2000 UNITS CAPS Take 2,000 Units by mouth daily.    [name] dofetilide (TIKOSYN) 250 MCG capsule TAKE ONE CAPSULE BY MOUTH TWICE DAILY PT MUST MAKE APPT FOR FURTHER REFILLS 60 capsule 0  . fexofenadine (ALLEGRA) 180 MG tablet Take 1 tablet (180 mg total) by mouth daily. 30 tablet 1  . fluticasone (FLONASE) 50 MCG/ACT nasal spray Place into both nostrils 2 (two) times daily as needed for allergies or rhinitis.    [name] gabapentin (NEURONTIN) 300 MG capsule Take 900 mg by mouth at bedtime.   3  . guaiFENesin (MUCINEX) 600 MG 12 hr tablet Take 600 mg by mouth 2 (two) times daily.    [name] levothyroxine (SYNTHROID, LEVOTHROID) 175 MCG tablet Take 175 mcg by mouth daily before breakfast.    . metFORMIN (GLUMETZA) 500 MG (MOD) 24 hr tablet Take 500 mg by mouth daily with breakfast.    . metoprolol tartrate (LOPRESSOR) 50 MG tablet Take 1 tablet (50 mg total) by mouth 2 (two) times daily. Needs office visit [phone] 180 tablet 3  . ondansetron (ZOFRAN ODT) 4 MG disintegrating tablet Take 1 tablet (4 mg total) by mouth every 8 (eight) hours as needed for nausea. 6 tablet 0  . pantoprazole (PROTONIX) 40 MG tablet Take 40 mg by mouth daily.    . potassium chloride (K-DUR) 10 MEQ tablet Take 2 tablets (20 mEq total) by mouth 2 (two) times daily. Take an extra 2 tabs when you take Metolazone (Patient taking differently: Take 20 mEq by mouth 2 (two) times daily. ) 360 tablet 3  . promethazine (PHENERGAN) 25 MG tablet Take 25 mg by mouth  every 6 (six) hours as needed for nausea or vomiting.    . promethazine-dextromethorphan (PROMETHAZINE-DM) 6.25-15 MG/5ML syrup Take 5 mLs by mouth 4 (four) times daily as needed for cough. 120 mL 0  . rOPINIRole (REQUIP) 1 MG tablet Take 1 tablet (1 mg total) by mouth daily. 90 tablet 2  . rosuvastatin (CRESTOR) 10 MG tablet Take 10 mg by mouth daily.     [name] spironolactone (ALDACTONE) 25 MG tablet Take 1 tablet (25 mg total) by mouth daily. 90 tablet 3  . torsemide (DEMADEX) 20 MG tablet Take 2 tablets (40 mg total) by mouth daily. Please call for office visit [phone] 180  tablet 3  . traMADol (ULTRAM) 50 MG tablet Take 50 mg by mouth 4 (four) times daily. Reported on 10/17/2015    . traZODone (DESYREL) 50 MG tablet Take 25 mg by mouth at bedtime.    . valACYclovir (VALTREX) 1000 MG tablet Take 1 tablet by mouth every 12 (twelve) hours as needed (fever blisters). Reported on 10/17/2015     No current facility-administered medications for this encounter.     Allergies  Allergen Reactions  . Meloxicam Rash  . 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 syndrome 09/02/15, resolved with diphenhydramine and slowing of rate      Social History   Socioeconomic History  . Marital status: Married    Spouse name: Not on file  . Number of children: Not on file  . Years of education: Not on file  . Highest education level: Not on file  Occupational History  . Not on file  Social Needs  . Financial resource strain: Not on file  . Food insecurity:    Worry: Not on file    Inability: Not on file  . Transportation needs:    Medical: Not on file    Non-medical: Not on file  Tobacco Use  . Smoking status: Former Smoker    Packs/day: 0.50    Years: 15.00    Pack years: 7.50    Types: Cigarettes    Last attempt to quit: 07/18/1991    Years since quitting: 26.2  . Smokeless tobacco: Never Used  Substance and Sexual Activity  . Alcohol use: Yes    Comment: occasional  . Drug use: Yes     Types: Marijuana  . Sexual activity: Not Currently  Lifestyle  . Physical activity:    Days per week: Not on file    Minutes per session: Not on file  . Stress: Not on file  Relationships  . Social connections:    Talks on phone: Not on file    Gets together: Not on file    Attends religious service: Not on file    Active member of club or organization: Not on file    Attends meetings of clubs or organizations: Not on file    Relationship status: Not on file  . Intimate partner violence:    Fear of current or ex partner: Not on file    Emotionally abused: Not on file    Physically abused: Not on file    Forced sexual activity: Not on file  Other Topics Concern  . Not on file  Social History Narrative   Lives in [place] with spouse.  Works as a Air traffic controller.    Family History  Problem Relation Age of Onset  . Heart failure Mother   . Stroke Father     Vitals:   10/23/17 1300  BP: 135/70  Pulse: 68  SpO2: 97%  Weight: 204 lb 1.9 oz (92.6 kg)    Wt Readings from Last 3 Encounters:  10/23/17 204 lb 1.9 oz (92.6 kg)  06/25/17 213 lb (96.6 kg)  09/26/16 212 lb 3.2 oz (96.3 kg)     PHYSICAL EXAM: General: No resp difficulty. HEENT: Normal anicteric  Neck: Supple. JVP 5-6. Carotids 2+ bilat; no bruits. No thyromegaly or nodule noted. Cor: PMI nondisplaced. RRR, 2/6 TR Lungs: CTAB, normal effort. No wheeze  Abdomen: Soft, non-tender, non-distended, no HSM. No bruits or masses. +BS  Extremities: No cyanosis, clubbing, or rash. R and LLE trace edema.  Neuro: alert &  oriented x 3, cranial nerves grossly intact. moves all 4 extremities w/o difficulty. Affect pleasant  ASSESSMENT & PLAN:   1. Chronic diastolic CHF with RV failure - Echo 02/02/16 LVEF 60-65%, Grade 3 DD. Mild AI, Mild MR, RV mildly dilated and severely reduced. RA mildly dilated. PA peak pressure 74 mm Hg. Concerns for restrictive cardiomyopathy. - Echo 02/08/16 LVEF 55-60%, Restrictive  physiology, (?)RV normal. No assessment of PA pressures. - Volume status much improved.  - Continue torsemide 40 mg daily. Can take 20 mg extra as needed.   - Continue spiro 25 mg daily.  - Continue amlodipine 5 mg daily. - Continue lopressor 50 mg BID.  - Reinforced fluid restriction to < 2 L daily, sodium restriction to less than 2000 mg daily, and the importance of daily weights.   2. Paroxysmal Afib s/p AF ablation/Maze s/p pacemaker - Seen by Dr. Rayann Heman 05/20/16. Remains in NSR s/p recent hospitalization with Tikosyn initiation.  - Regular on exam.  - Continue Eliquis 5 mg BID. No bleeding. Samples provided today.  3. AS s/p AVR - Stable bioprosthesis with mild regurgitation on Echo 02/08/16. No change to current plan.   4. Pulmonary HTN - suspect pulmonary venous hypertension - RHC in 10/17 with pulmonary venous HTN primarily. No change.  5. Mild CAD by cath 01/2016 - Medical management. Continue atorvastatin 40 mg daily - Denies s/s ischemia.  6. Chronic respiratory failure with hypoxia - No longer on home O2.  - Follows up with pulmonary.   7. HTN - SBP 130s today  BMET Repeat echo   Victor Shore, NP  10/23/17   Patient seen and examined with the above-signed Advanced Practice Provider and/or Housestaff. I personally reviewed laboratory data, imaging studies and relevant notes. I independently examined the patient and formulated the important aspects of the plan. I have edited the note to reflect any of my changes or salient points. I have personally discussed the plan with the patient and/or family.  Overall doing well. Volume status much improved. NYHA II-III. Maintaining NSR on Tikosyn. Will continue current therapy. Will need repeat echo. Check labs today.   Glori Bickers, MD  2:08 PM

## 2017-10-23 NOTE — Telephone Encounter (Signed)
Medication Samples have been provided to the patient.  Drug name: Eliquis      Strength: 5mg       Qty: 4LOT: [id number]        Exp.Date: 3/21  Dosing instructions: Take 1 Tablet by mouth twice daily  The patient has been instructed regarding the correct time, dose, and frequency of taking this medication, including desired effects and most common side effects.   [name] 2:15 PM 10/23/2017

## 2017-10-24 ENCOUNTER — Telehealth (HOSPITAL_COMMUNITY): Payer: Self-pay | Admitting: *Deleted

## 2017-10-24 NOTE — Telephone Encounter (Signed)
Patient's creatinine yesterday was up to 2.11.  Per Dr. Haroldine Laws patient needs to come back for repeat bmet next week.  I have called both lines and left messages asking for him to call us back.   Will forward to Adolm Joseph, [place] who is on triage incase patient calls back.

## 2017-10-27 NOTE — Telephone Encounter (Signed)
pts wife called appt scheduled for Wed.

## 2017-10-27 NOTE — Telephone Encounter (Signed)
Called and spoke with patient, he said he would come back to have labs rechecked this week.  He said he will have his wife call back to schedule lab appt for Wednesday or Friday this week.

## 2017-10-29 ENCOUNTER — Telehealth (HOSPITAL_COMMUNITY): Payer: Self-pay | Admitting: *Deleted

## 2017-10-29 ENCOUNTER — Ambulatory Visit (HOSPITAL_COMMUNITY)
Admission: RE | Admit: 2017-10-29 | Discharge: 2017-10-29 | Disposition: A | Discharge status: Home / Self Care | Payer: Medicare Other | Source: Ambulatory Visit | Attending: Internal Medicine | Admitting: Internal Medicine

## 2017-10-29 ENCOUNTER — Other Ambulatory Visit (HOSPITAL_COMMUNITY): Payer: Self-pay | Admitting: *Deleted

## 2017-10-29 DIAGNOSIS — I5032 Chronic diastolic (congestive) heart failure: Secondary | ICD-10-CM | POA: Diagnosis present

## 2017-10-29 LAB — BASIC METABOLIC PANEL
Anion gap: 14 (ref 5–15)
BUN: 30 mg/dL — ABNORMAL HIGH (ref 8–23)
CO2: 30 mmol/L (ref 22–32)
Calcium: 10.2 mg/dL (ref 8.9–10.3)
Chloride: 94 mmol/L — ABNORMAL LOW (ref 98–111)
Creatinine, Ser: 2.35 mg/dL — ABNORMAL HIGH (ref 0.61–1.24)
GFR calc Af Amer: 31 mL/min — ABNORMAL LOW (ref >60)
GFR calc non Af Amer: 27 mL/min — ABNORMAL LOW (ref >60)
Glucose, Bld: 127 mg/dL — ABNORMAL HIGH (ref 70–99)
Potassium: 4.4 mmol/L (ref 3.5–5.1)
Sodium: 138 mmol/L (ref 135–145)

## 2017-10-29 MED ORDER — TORSEMIDE 20 MG PO TABS: 40.0000 mg | ORAL_TABLET | ORAL | 3 refills | Status: DC

## 2017-10-29 NOTE — Telephone Encounter (Signed)
Result Notes for Basic metabolic panel   Notes recorded by Darron Doom, RN on 10/29/2017 at 2:57 PM EDT Spoke with patient and wife, they're agreeable with plan. Medication updated, bmet ordered, and lab appt scheduled. ------  Notes recorded by Harvie Junior, CMA on 10/29/2017 at 2:38 PM EDT Left vm for pt to call for lab results ------  Notes recorded by Scarlette Calico, RN on 10/29/2017 at 1:30 PM EDT Left message to call back ------  Notes recorded by Bensimhon, Shaune Pascal, MD on 10/29/2017 at 12:47 PM EDT Creatinine continues to climb. Stop torsemide for 2 days. Then switch to 40mg  every other day. No metolazone. Repeat BMET in 1 week.

## 2017-11-04 ENCOUNTER — Telehealth: Payer: Self-pay

## 2017-11-04 NOTE — Telephone Encounter (Addendum)
Per the pts wifes request I have ordered the pts Tikosyn from [employer]. Order should arrive in the office within 7 to 10 days.   Order# [id number] [employer] Phone#-[phone] ID: [id number]

## 2017-11-05 ENCOUNTER — Ambulatory Visit (HOSPITAL_COMMUNITY)
Admission: RE | Admit: 2017-11-05 | Discharge: 2017-11-05 | Disposition: A | Discharge status: Home / Self Care | Payer: Medicare Other | Source: Ambulatory Visit | Attending: Cardiology | Admitting: Cardiology

## 2017-11-05 DIAGNOSIS — I5032 Chronic diastolic (congestive) heart failure: Secondary | ICD-10-CM

## 2017-11-05 DIAGNOSIS — Z953 Presence of xenogenic heart valve: Secondary | ICD-10-CM | POA: Diagnosis not present

## 2017-11-05 DIAGNOSIS — E119 Type 2 diabetes mellitus without complications: Secondary | ICD-10-CM | POA: Diagnosis not present

## 2017-11-05 DIAGNOSIS — R011 Cardiac murmur, unspecified: Secondary | ICD-10-CM | POA: Insufficient documentation

## 2017-11-05 DIAGNOSIS — E785 Hyperlipidemia, unspecified: Secondary | ICD-10-CM | POA: Diagnosis not present

## 2017-11-05 DIAGNOSIS — I110 Hypertensive heart disease with heart failure: Secondary | ICD-10-CM | POA: Diagnosis not present

## 2017-11-05 DIAGNOSIS — I4891 Unspecified atrial fibrillation: Secondary | ICD-10-CM | POA: Insufficient documentation

## 2017-11-05 LAB — BASIC METABOLIC PANEL
Anion gap: 9 (ref 5–15)
BUN: 19 mg/dL (ref 8–23)
CO2: 29 mmol/L (ref 22–32)
Calcium: 9.9 mg/dL (ref 8.9–10.3)
Chloride: 98 mmol/L (ref 98–111)
Creatinine, Ser: 1.61 mg/dL — ABNORMAL HIGH (ref 0.61–1.24)
GFR calc Af Amer: 49 mL/min — ABNORMAL LOW (ref >60)
GFR calc non Af Amer: 42 mL/min — ABNORMAL LOW (ref >60)
Glucose, Bld: 173 mg/dL — ABNORMAL HIGH (ref 70–99)
Potassium: 4.2 mmol/L (ref 3.5–5.1)
Sodium: 136 mmol/L (ref 135–145)

## 2017-11-05 NOTE — Progress Notes (Signed)
  Echocardiogram 2D Echocardiogram has been performed.  Jannett Celestine 11/05/2017, 11:32 AM

## 2017-11-07 NOTE — Telephone Encounter (Signed)
The pt is advised that his Tikosyn has arrived in the office from [facility] and that he can pick up at his convenience. He verbalized understanding and thanked me for letting him know.

## 2017-11-14 ENCOUNTER — Other Ambulatory Visit: Payer: Self-pay

## 2017-11-17 ENCOUNTER — Ambulatory Visit (INDEPENDENT_AMBULATORY_CARE_PROVIDER_SITE_OTHER): Payer: Medicare Other | Admitting: *Deleted

## 2017-11-17 DIAGNOSIS — I441 Atrioventricular block, second degree: Secondary | ICD-10-CM

## 2017-11-17 NOTE — Progress Notes (Signed)
Remote pacemaker transmission.   

## 2017-11-18 ENCOUNTER — Encounter: Payer: Self-pay | Admitting: Cardiology

## 2017-12-14 LAB — CUP PACEART REMOTE DEVICE CHECK
Battery Remaining Longevity: 52 mo
Battery Remaining Percentage: 46 %
Battery Voltage: 2.86 V
Brady Statistic AP VP Percent: 59 %
Brady Statistic AP VS Percent: 1 %
Brady Statistic AS VP Percent: 40 %
Brady Statistic AS VS Percent: 1 %
Brady Statistic RA Percent Paced: 59 %
Brady Statistic RV Percent Paced: 99 %
Date Time Interrogation Session: 20190722062735
Implantable Lead Implant Date: 20131203
Implantable Lead Implant Date: 20131203
Implantable Lead Location: 753859
Implantable Lead Location: 753860
Implantable Lead Model: 1948
Implantable Pulse Generator Implant Date: 20131203
Lead Channel Impedance Value: 380 Ohm
Lead Channel Impedance Value: 610 Ohm
Lead Channel Pacing Threshold Amplitude: 0.75 V
Lead Channel Pacing Threshold Amplitude: 0.75 V
Lead Channel Pacing Threshold Pulse Width: 0.5 ms
Lead Channel Pacing Threshold Pulse Width: 0.8 ms
Lead Channel Sensing Intrinsic Amplitude: 3.6 mV
Lead Channel Sensing Intrinsic Amplitude: 9 mV
Lead Channel Setting Pacing Amplitude: 1 V
Lead Channel Setting Pacing Amplitude: 1.75 V
Lead Channel Setting Pacing Pulse Width: 0.8 ms
Lead Channel Setting Sensing Sensitivity: 4 mV
Pulse Gen Model: 2210
Pulse Gen Serial Number: 7408461

## 2018-01-29 ENCOUNTER — Other Ambulatory Visit (HOSPITAL_COMMUNITY): Payer: Self-pay | Admitting: Internal Medicine

## 2018-02-16 ENCOUNTER — Ambulatory Visit (INDEPENDENT_AMBULATORY_CARE_PROVIDER_SITE_OTHER): Payer: Medicare Other | Admitting: *Deleted

## 2018-02-16 DIAGNOSIS — I441 Atrioventricular block, second degree: Secondary | ICD-10-CM

## 2018-02-16 NOTE — Progress Notes (Signed)
Remote pacemaker transmission.   

## 2018-03-09 ENCOUNTER — Other Ambulatory Visit: Payer: Self-pay

## 2018-03-09 MED ORDER — DOFETILIDE 250 MCG PO CAPS: 250.0000 ug | ORAL_CAPSULE | Freq: Two times a day (BID) | ORAL | 0 refills | Status: DC

## 2018-03-17 ENCOUNTER — Telehealth: Payer: Self-pay | Admitting: Internal Medicine

## 2018-03-17 ENCOUNTER — Telehealth: Payer: Self-pay

## 2018-03-17 NOTE — Telephone Encounter (Signed)
**Note De-Identified  Obfuscation** The pts Tikosyn arrived in the office today from [place] pt asst. I called the pt and made her aware that it is here and that I have left it in the front office for her to pick up.  Also, I discussed the fact that the [facility] program is coming to an end and that the Tikosyn shipment we received for him today will be the last.  I have advised the pts wife to contact [facility] at [phone] and/or [facility] at [phone] to see if the pt can get his Tikosyn at an affordable cost through either place.  She verbalized understanding and thanked me for letting her know.

## 2018-03-17 NOTE — Telephone Encounter (Signed)
New message:       Pt's wife is returning a call

## 2018-03-17 NOTE — Telephone Encounter (Signed)
Please see phone notes from earlier today.

## 2018-03-19 LAB — CUP PACEART REMOTE DEVICE CHECK
Battery Remaining Longevity: 50 mo
Battery Remaining Percentage: 46 %
Battery Voltage: 2.86 V
Brady Statistic AP VP Percent: 57 %
Brady Statistic AP VS Percent: 1 %
Brady Statistic AS VP Percent: 43 %
Brady Statistic AS VS Percent: 1 %
Brady Statistic RA Percent Paced: 48 %
Brady Statistic RV Percent Paced: 99 %
Date Time Interrogation Session: 20191021065329
Implantable Lead Implant Date: 20131203
Implantable Lead Implant Date: 20131203
Implantable Lead Location: 753859
Implantable Lead Location: 753860
Implantable Lead Model: 1948
Implantable Pulse Generator Implant Date: 20131203
Lead Channel Impedance Value: 390 Ohm
Lead Channel Impedance Value: 640 Ohm
Lead Channel Pacing Threshold Amplitude: 0.875 V
Lead Channel Pacing Threshold Amplitude: 1 V
Lead Channel Pacing Threshold Pulse Width: 0.5 ms
Lead Channel Pacing Threshold Pulse Width: 0.8 ms
Lead Channel Sensing Intrinsic Amplitude: 3.7 mV
Lead Channel Sensing Intrinsic Amplitude: 9 mV
Lead Channel Setting Pacing Amplitude: 1.25 V
Lead Channel Setting Pacing Amplitude: 1.875
Lead Channel Setting Pacing Pulse Width: 0.8 ms
Lead Channel Setting Sensing Sensitivity: 4 mV
Pulse Gen Model: 2210
Pulse Gen Serial Number: 7408461

## 2018-04-21 ENCOUNTER — Emergency Department (HOSPITAL_COMMUNITY)
Admission: EM | Admit: 2018-04-21 | Discharge: 2018-04-21 | Disposition: A | Discharge status: Home / Self Care | Payer: Medicare Other | Attending: Emergency Medicine | Admitting: Emergency Medicine

## 2018-04-21 ENCOUNTER — Encounter (HOSPITAL_COMMUNITY): Payer: Self-pay | Admitting: Emergency Medicine

## 2018-04-21 ENCOUNTER — Emergency Department (HOSPITAL_COMMUNITY): Payer: Medicare Other

## 2018-04-21 DIAGNOSIS — I110 Hypertensive heart disease with heart failure: Secondary | ICD-10-CM | POA: Insufficient documentation

## 2018-04-21 DIAGNOSIS — Z950 Presence of cardiac pacemaker: Secondary | ICD-10-CM | POA: Insufficient documentation

## 2018-04-21 DIAGNOSIS — Z79899 Other long term (current) drug therapy: Secondary | ICD-10-CM | POA: Diagnosis not present

## 2018-04-21 DIAGNOSIS — Z87891 Personal history of nicotine dependence: Secondary | ICD-10-CM | POA: Diagnosis not present

## 2018-04-21 DIAGNOSIS — Z7984 Long term (current) use of oral hypoglycemic drugs: Secondary | ICD-10-CM | POA: Insufficient documentation

## 2018-04-21 DIAGNOSIS — E785 Hyperlipidemia, unspecified: Secondary | ICD-10-CM | POA: Insufficient documentation

## 2018-04-21 DIAGNOSIS — R0602 Shortness of breath: Secondary | ICD-10-CM

## 2018-04-21 DIAGNOSIS — E119 Type 2 diabetes mellitus without complications: Secondary | ICD-10-CM | POA: Insufficient documentation

## 2018-04-21 DIAGNOSIS — E039 Hypothyroidism, unspecified: Secondary | ICD-10-CM | POA: Insufficient documentation

## 2018-04-21 DIAGNOSIS — I4819 Other persistent atrial fibrillation: Secondary | ICD-10-CM | POA: Diagnosis not present

## 2018-04-21 DIAGNOSIS — Z7901 Long term (current) use of anticoagulants: Secondary | ICD-10-CM | POA: Insufficient documentation

## 2018-04-21 DIAGNOSIS — I5032 Chronic diastolic (congestive) heart failure: Secondary | ICD-10-CM | POA: Diagnosis not present

## 2018-04-21 DIAGNOSIS — J209 Acute bronchitis, unspecified: Secondary | ICD-10-CM

## 2018-04-21 LAB — COMPREHENSIVE METABOLIC PANEL
ALT: 24 U/L (ref 0–44)
AST: 24 U/L (ref 15–41)
Albumin: 3.5 g/dL (ref 3.5–5.0)
Alkaline Phosphatase: 66 U/L (ref 38–126)
Anion gap: 10 (ref 5–15)
BUN: 17 mg/dL (ref 8–23)
CO2: 23 mmol/L (ref 22–32)
Calcium: 9 mg/dL (ref 8.9–10.3)
Chloride: 103 mmol/L (ref 98–111)
Creatinine, Ser: 1.82 mg/dL — ABNORMAL HIGH (ref 0.61–1.24)
GFR calc Af Amer: 43 mL/min — ABNORMAL LOW (ref >60)
GFR calc non Af Amer: 37 mL/min — ABNORMAL LOW (ref >60)
Glucose, Bld: 187 mg/dL — ABNORMAL HIGH (ref 70–99)
Potassium: 4.5 mmol/L (ref 3.5–5.1)
Sodium: 136 mmol/L (ref 135–145)
Total Bilirubin: 1 mg/dL (ref 0.3–1.2)
Total Protein: 6.6 g/dL (ref 6.5–8.1)

## 2018-04-21 LAB — CBC WITH DIFFERENTIAL/PLATELET
Abs Immature Granulocytes: 0.05 10*3/uL (ref 0.00–0.07)
Basophils Absolute: 0 10*3/uL (ref 0.0–0.1)
Basophils Relative: 0 %
Eosinophils Absolute: 0 10*3/uL (ref 0.0–0.5)
Eosinophils Relative: 0 %
HCT: 38.6 % — ABNORMAL LOW (ref 39.0–52.0)
Hemoglobin: 11.7 g/dL — ABNORMAL LOW (ref 13.0–17.0)
Immature Granulocytes: 1 %
Lymphocytes Relative: 5 %
Lymphs Abs: 0.5 10*3/uL — ABNORMAL LOW (ref 0.7–4.0)
MCH: 28.3 pg (ref 26.0–34.0)
MCHC: 30.3 g/dL (ref 30.0–36.0)
MCV: 93.5 fL (ref 80.0–100.0)
Monocytes Absolute: 1.2 10*3/uL — ABNORMAL HIGH (ref 0.1–1.0)
Monocytes Relative: 13 %
Neutro Abs: 7.5 10*3/uL (ref 1.7–7.7)
Neutrophils Relative %: 81 %
Platelets: 125 10*3/uL — ABNORMAL LOW (ref 150–400)
RBC: 4.13 MIL/uL — ABNORMAL LOW (ref 4.22–5.81)
RDW: 15 % (ref 11.5–15.5)
WBC: 9.3 10*3/uL (ref 4.0–10.5)
nRBC: 0 % (ref 0.0–0.2)

## 2018-04-21 LAB — BRAIN NATRIURETIC PEPTIDE: B Natriuretic Peptide: 225.5 pg/mL — ABNORMAL HIGH (ref 0.0–100.0)

## 2018-04-21 LAB — TROPONIN I: Troponin I: <0.03 ng/mL (ref <0.03)

## 2018-04-21 MED ORDER — OXYCODONE-ACETAMINOPHEN 5-325 MG PO TABS
1.0000 | ORAL_TABLET | Freq: Once | ORAL | Status: AC
  Administered 2018-04-21: 1 via ORAL
  Filled 2018-04-21: qty 1

## 2018-04-21 MED ORDER — LEVOFLOXACIN 500 MG PO TABS: 500.0000 mg | ORAL_TABLET | Freq: Every day | ORAL | 0 refills | Status: DC

## 2018-04-21 MED ORDER — ONDANSETRON 8 MG PO TBDP: 8.0000 mg | ORAL_TABLET | Freq: Three times a day (TID) | ORAL | 0 refills | Status: DC | PRN

## 2018-04-21 MED ORDER — LEVOFLOXACIN 500 MG PO TABS
500.0000 mg | ORAL_TABLET | Freq: Once | ORAL | Status: AC
  Administered 2018-04-21: 500 mg via ORAL
  Filled 2018-04-21: qty 1

## 2018-04-21 NOTE — ED Provider Notes (Signed)
[facility] EMERGENCY DEPARTMENT Provider Note   CSN: [id number] Arrival date & time: 04/21/18  1718     History   Chief Complaint Chief Complaint  Patient presents with  . Shortness of Breath    HPI [name] is a 67 y.o. male.  HPI Patient is a 67 year old male with oxygen dependence and COPD who presents to the emergency department with chills productive cough fever and nausea.  He said shortness of breath for several months but reports worsening over the past several days.  EMS gave Zofran and 800 mg of Motrin for fever.  He was also given 1000 mL of normal saline.  He feels somewhat better at this time.  He denies new orthopnea.  Denies worsening lower extremity swelling.  Denies abdominal pain.  No active chest pain at this time.  Symptoms are mild in severity.  He has noticed more exertional shortness of breath and shortness of breath when going upstairs.  No history of DVT or pulmonary embolism.  No unilateral leg swelling.  He is on chronic anticoagulation and is compliant with his medications   Past Medical History:  Diagnosis Date  . Aortic stenosis, mild   . Arthritis of hand    "just a little bit in both hands" (03/31/2012)  . Asthma    "little bit" (03/31/2012)  . Atrial fibrillation [facility])    dx '04; DCCV '04, placed on flecainide, failed DCCV 04/2010, flecainide stopped; s/p successful A.fib ablation 01/31/12  . CHF (congestive heart failure) ([place])   . Diabetes mellitus without complication (HCC)    borderline  . DJD (degenerative joint disease)   . Exertional dyspnea   . Fibromyalgia   . First degree AV block    post ablation, ~481ms  . GERD (gastroesophageal reflux disease)   . Heart murmur   . Hyperlipidemia   . Hypertension   . Hypothyroidism    S/P radiation  . Left atrial enlargement    LA size 60mm by echo 11/21/11  . Mitral regurgitation    trivial  . Obesity   . Obstructive sleep apnea    mild by sleep study 2013; pt  stated he does not have a machine because "it wasn't bad enough for him to have one"  . Pacemaker 03/31/2012  . Restless leg syndrome   . Second degree AV block   . Visit for monitoring Tikosyn therapy 04/16/2016    Patient Active Problem List   Diagnosis Date Noted  . Chronic respiratory failure ([place]) 05/23/2016  . CAD (coronary artery disease) 05/23/2016  . Persistent atrial fibrillation   . Atypical atrial flutter ([place])   . Visit for monitoring Tikosyn therapy 04/16/2016  . Diastolic heart failure ([place]) 02/03/2016  . Cough 12/06/2015  . Hallucinations 09/04/2015  . Lymphadenopathy 09/04/2015  . Ascending aortic aneurysm ([place]) 09/04/2015  . Hypoxia 09/02/2015  . S/P aortic valve replacement with bioprosthetic valve 09/02/2015  . Pleural effusion 09/02/2015  . S/P AVR 08/22/2015  . GERD (gastroesophageal reflux disease) 09/20/2014  . 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 Dyspnea 09/15/2013  . Pulmonary hypertension ([place]) 09/15/2013  . Shortness of breath 05/05/2013  . 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 Bradycardia 03/31/2012  . Second degree AV block 03/31/2012  . AV block, 1st degree 02/01/2012  . PAF (paroxysmal atrial fibrillation) ([place]) 11/13/2011  . Fatigue 11/13/2011  . Hypertension 11/13/2011  . Aortic stenosis 11/13/2011  . Sleep apnea 11/13/2011    Past Surgical History:  Procedure Laterality Date  . AORTIC  VALVE REPLACEMENT N/A 08/22/2015   Procedure: AORTIC VALVE REPLACEMENT (AVR);  Surgeon: Ivin Poot, MD;  Location: [facility];  Service: Open Heart Surgery;  Laterality: N/A;  . ATRIAL FIBRILLATION ABLATION  01/30/2012   PVI by Dr. Rayann Heman  . ATRIAL FIBRILLATION ABLATION N/A 01/31/2012   Procedure: ATRIAL FIBRILLATION ABLATION;  Surgeon: Thompson Grayer, MD;  Location: [facility] CATH LAB;  Service: Cardiovascular;  Laterality: N/A;  . BACK SURGERY     X 3  . CARDIAC CATHETERIZATION N/A 08/09/2015   Procedure: Right/Left Heart Cath and Coronary Angiography;  Surgeon: Peter M  Martinique, MD;  Location: [facility] CV LAB;  Service: Cardiovascular;  Laterality: N/A;  . CARDIAC CATHETERIZATION N/A 02/05/2016   Procedure: Right/Left Heart Cath and Coronary Angiography;  Surgeon: Jettie Booze, MD;  Location: [facility] CV LAB;  Service: Cardiovascular;  Laterality: N/A;  . CARDIAC CATHETERIZATION N/A 04/17/2016   Procedure: Right Heart Cath;  Surgeon: Jolaine Artist, MD;  Location: [facility] CV LAB;  Service: Cardiovascular;  Laterality: N/A;  . CARDIOVASCULAR STRESS TEST  03/12/2002   EF 48%, NO EVIDENCE OF ISCHEMIA  . CARDIOVERSION  01/2012; 03/31/2012  . CARDIOVERSION N/A 02/25/2012   Procedure: CARDIOVERSION;  Surgeon: Thompson Grayer, MD;  Location: [facility] CATH LAB;  Service: Cardiovascular;  Laterality: N/A;  . CARDIOVERSION N/A 03/31/2012   Procedure: CARDIOVERSION;  Surgeon: Thompson Grayer, MD;  Location: [facility] CATH LAB;  Service: Cardiovascular;  Laterality: N/A;  . CARDIOVERSION N/A 11/23/2015   Procedure: CARDIOVERSION;  Surgeon: Larey Dresser, MD;  Location: [facility] ENDOSCOPY;  Service: Cardiovascular;  Laterality: N/A;  . CARDIOVERSION N/A 04/08/2016   Procedure: CARDIOVERSION;  Surgeon: Jolaine Artist, MD;  Location: [facility] ENDOSCOPY;  Service: Cardiovascular;  Laterality: N/A;  . CLIPPING OF ATRIAL APPENDAGE N/A 08/22/2015   Procedure: CLIPPING OF ATRIAL APPENDAGE;  Surgeon: Ivin Poot, MD;  Location: [facility];  Service: Open Heart Surgery;  Laterality: N/A;  . DOPPLER ECHOCARDIOGRAPHY  03/11/2002   EF 70-75%  . FINGER SURGERY Left    Middle finger  . FINGER TENDON REPAIR  1980's   "right little finger" (03/31/2012)  . INSERT / REPLACE / REMOVE PACEMAKER     [employer]  . LEFT AND RIGHT HEART CATHETERIZATION WITH CORONARY ANGIOGRAM N/A 10/27/2012   Procedure: LEFT AND RIGHT HEART CATHETERIZATION WITH CORONARY ANGIOGRAM;  Surgeon: Laverda Page, MD;  Location: [facility] CATH LAB;  Service: Cardiovascular;  Laterality: N/A;  . LUMBAR DISC SURGERY  1980's X2;  2000's    . MAZE N/A 08/22/2015   Procedure: MAZE;  Surgeon: Ivin Poot, MD;  Location: [facility];  Service: Open Heart Surgery;  Laterality: N/A;  . PACEMAKER INSERTION  03/31/2012   STJ Accent DR pacemaker implanted by Dr Rayann Heman  . PERMANENT PACEMAKER INSERTION N/A 03/31/2012   Procedure: PERMANENT PACEMAKER INSERTION;  Surgeon: Thompson Grayer, MD;  Location: [facility] CATH LAB;  Service: Cardiovascular;  Laterality: N/A;  . TEE WITHOUT CARDIOVERSION  01/30/2012   Procedure: TRANSESOPHAGEAL ECHOCARDIOGRAM (TEE);  Surgeon: Larey Dresser, MD;  Location: [facility];  Service: Cardiovascular;  Laterality: N/A;  ablation next day  . TEE WITHOUT CARDIOVERSION N/A 08/22/2015   Procedure: TRANSESOPHAGEAL ECHOCARDIOGRAM (TEE);  Surgeon: Ivin Poot, MD;  Location: [place];  Service: Open Heart Surgery;  Laterality: N/A;  . US ECHOCARDIOGRAPHY  08/07/2009   EF 55-60%        Home Medications    Prior to Admission medications   Medication Sig Start Date End  Date Taking? Authorizing Provider  albuterol (PROVENTIL HFA;VENTOLIN HFA) 108 (90 Base) MCG/ACT inhaler Inhale 2 puffs into the lungs every 6 (six) hours as needed for wheezing or shortness of breath. 12/28/15  Yes Collene Gobble, MD  amLODipine (NORVASC) 10 MG tablet Take 0.5 tablets (5 mg total) by mouth daily. 09/18/15  Yes Nahser, Wonda Cheng, MD  apixaban (ELIQUIS) 5 MG TABS tablet Take 1 tablet (5 mg total) by mouth 2 (two) times daily. 05/26/17  Yes Bensimhon, Shaune Pascal, MD  budesonide-formoterol [facility]) 160-4.5 MCG/ACT inhaler Inhale 2 puffs into the lungs 2 (two) times daily. 11/13/15  Yes Collene Gobble, MD  Cholecalciferol (VITAMIN D) 2000 UNITS CAPS Take 2,000 Units by mouth daily.   Yes [provider]  dofetilide (TIKOSYN) 250 MCG capsule Take 1 capsule (250 mcg total) by mouth 2 (two) times daily. 03/09/18  Yes Allred, Jeneen Rinks, MD  fexofenadine (ALLEGRA) 180 MG tablet Take 1 tablet (180 mg total) by mouth daily. 03/01/14  Yes Collene Gobble,  MD  fluticasone (FLONASE) 50 MCG/ACT nasal spray Place into both nostrils 2 (two) times daily as needed for allergies or rhinitis.   Yes [provider]  gabapentin (NEURONTIN) 300 MG capsule Take 600-1,200 mg by mouth See admin instructions. Take 600 mg by mouth in the morning, 600 mg at lunchtime, and 1,200 mg at bedtime 09/13/14  Yes [provider]  guaiFENesin (MUCINEX) 600 MG 12 hr tablet Take 1,200 mg by mouth 2 (two) times daily.    Yes [provider]  levothyroxine (SYNTHROID, LEVOTHROID) 175 MCG tablet Take 175 mcg by mouth daily before breakfast.   Yes [provider]  levofloxacin (LEVAQUIN) 500 MG tablet Take 1 tablet (500 mg total) by mouth daily. 04/21/18   Jola Schmidt, MD  metFORMIN (GLUMETZA) 500 MG (MOD) 24 hr tablet Take 500 mg by mouth daily with breakfast.    [provider]  metoprolol tartrate (LOPRESSOR) 50 MG tablet Take 1 tablet (50 mg total) by mouth 2 (two) times daily. Needs office visit [phone] 07/22/17   Bensimhon, Shaune Pascal, MD  ondansetron (ZOFRAN ODT) 4 MG disintegrating tablet Take 1 tablet (4 mg total) by mouth every 8 (eight) hours as needed for nausea. 06/15/17   Tanna Furry, MD  pantoprazole (PROTONIX) 40 MG tablet Take 40 mg by mouth daily.    [provider]  potassium chloride (K-DUR) 10 MEQ tablet Take 2 tablets (20 mEq total) by mouth 2 (two) times daily. Take an extra 2 tabs when you take Metolazone Patient taking differently: Take 20 mEq by mouth 2 (two) times daily.  06/27/16   Bensimhon, Shaune Pascal, MD  potassium chloride (K-DUR,KLOR-CON) 10 MEQ tablet TAKE 2 TABLETS BY MOUTH TWO TIMES DAILY 01/29/18   Bensimhon, Shaune Pascal, MD  promethazine (PHENERGAN) 25 MG tablet Take 25 mg by mouth every 6 (six) hours as needed for nausea or vomiting.    [provider]  promethazine-dextromethorphan (PROMETHAZINE-DM) 6.25-15 MG/5ML syrup Take 5 mLs by mouth 4 (four) times daily as needed for cough. 06/15/17    Tanna Furry, MD  rOPINIRole (REQUIP) 1 MG tablet Take 1 tablet (1 mg total) by mouth daily. 12/24/13   Collene Gobble, MD  rosuvastatin (CRESTOR) 10 MG tablet Take 10 mg by mouth daily.  08/08/16   [provider]  spironolactone (ALDACTONE) 25 MG tablet Take 1 tablet (25 mg total) by mouth daily. 02/16/16   Shirley Friar, PA-C  torsemide (DEMADEX) 20 MG tablet Take  2 tablets (40 mg total) by mouth every other day. 10/29/17   Bensimhon, Shaune Pascal, MD  traMADol (ULTRAM) 50 MG tablet Take 50 mg by mouth 4 (four) times daily. Reported on 10/17/2015 10/01/13   [provider]  traZODone (DESYREL) 50 MG tablet Take 25 mg by mouth at bedtime.    [provider]  valACYclovir (VALTREX) 1000 MG tablet Take 1 tablet by mouth every 12 (twelve) hours as needed (fever blisters). Reported on 10/17/2015 04/13/13   [provider]    Family History Family History  Problem Relation Age of Onset  . Heart failure Mother   . Stroke Father     Social History Social History   Tobacco Use  . Smoking status: Former Smoker    Packs/day: 0.50    Years: 15.00    Pack years: 7.50    Types: Cigarettes    Last attempt to quit: 07/18/1991    Years since quitting: 26.7  . Smokeless tobacco: Never Used  Substance Use Topics  . Alcohol use: Yes    Comment: occasional  . Drug use: Yes    Types: Marijuana     Allergies   Meloxicam and Vancomycin   Review of Systems Review of Systems  All other systems reviewed and are negative.    Physical Exam Updated Vital Signs BP 114/61   Pulse 68   Temp 99.9 F (37.7 C) (Oral)   Resp (!) 28   SpO2 99%   Physical Exam Vitals signs and nursing note reviewed.  Constitutional:      Appearance: He is well-developed.  HENT:     Head: Normocephalic and atraumatic.  Neck:     Musculoskeletal: Normal range of motion.  Cardiovascular:     Rate and Rhythm: Normal rate and regular rhythm.     Heart sounds: Normal heart  sounds.  Pulmonary:     Effort: Pulmonary effort is normal. No respiratory distress.     Breath sounds: Normal breath sounds.  Abdominal:     General: There is no distension.     Palpations: Abdomen is soft.     Tenderness: There is no abdominal tenderness.  Musculoskeletal: Normal range of motion.     Right lower leg: He exhibits no tenderness. No edema.     Left lower leg: He exhibits no tenderness. No edema.  Skin:    General: Skin is warm and dry.  Neurological:     Mental Status: He is alert and oriented to person, place, and time.  Psychiatric:        Judgment: Judgment normal.      ED Treatments / Results  Labs (all labs ordered are listed, but only abnormal results are displayed) Labs Reviewed  CBC WITH DIFFERENTIAL/PLATELET - Abnormal; Notable for the following components:      Result Value   RBC 4.13 (*)    Hemoglobin 11.7 (*)    HCT 38.6 (*)    Platelets 125 (*)    Lymphs Abs 0.5 (*)    Monocytes Absolute 1.2 (*)    All other components within normal limits  COMPREHENSIVE METABOLIC PANEL - Abnormal; Notable for the following components:   Glucose, Bld 187 (*)    Creatinine, Ser 1.82 (*)    GFR calc non Af Amer 37 (*)    GFR calc Af Amer 43 (*)    All other components within normal limits  BRAIN NATRIURETIC PEPTIDE - Abnormal; Notable for the following components:   B Natriuretic Peptide 225.5 (*)  All other components within normal limits  TROPONIN I    EKG EKG Interpretation  Date/Time:  Tuesday April 21 2018 17:19:17 EST Ventricular Rate:  70 PR Interval:    QRS Duration: 161 QT Interval:  427 QTC Calculation: 461 R Axis:   -81 Text Interpretation:  AV PACED RHYTHM Confirmed by Jola Schmidt [id number]) on 04/21/2018 7:06:55 PM   Radiology Dg Chest 2 View  Result Date: 04/21/2018 CLINICAL DATA:  Fever, productive cough, and chills. Shortness of breath for several months. EXAM: CHEST - 2 VIEW COMPARISON:  06/14/2017 FINDINGS: The heart size  and mediastinal contours are within normal limits. Aortic atherosclerosis. Previous aortic valve replacement. Dual lead transvenous pacemaker remains in appropriate position. Mild interstitial prominence and left lung scarring are again noted. No evidence of acute infiltrate or pulmonary edema. No evidence of pleural effusion. The visualized skeletal structures are unremarkable. IMPRESSION: Stable exam.  No active cardiopulmonary disease. Electronically Signed   By: Earle Gell M.D.   On: 04/21/2018 18:59    Procedures Procedures (including critical care time)  Medications Ordered in ED Medications  levofloxacin (LEVAQUIN) tablet 500 mg (has no administration in time range)  oxyCODONE-acetaminophen (PERCOCET/ROXICET) 5-325 MG per tablet 1 tablet (has no administration in time range)     Initial Impression / Assessment and Plan / ED Course  I have reviewed the triage vital signs and the nursing notes.  Pertinent labs & imaging results that were available during my care of the patient were reviewed by me and considered in my medical decision making (see chart for details).     Work-up in the emergency department without significant abnormality.  Chest x-ray demonstrates no obvious infiltrate.  Clinically it sounds like he may be developing acute bronchitis versus developing pneumonia.  Patient will be started on antibiotics.  No wheezing at this time and thus no indication for bronchodilators.  Discharged home in good condition with primary care follow-up.  He understands return to the emergency department for new or worsening symptoms.  First dose antibiotics in the ER.  Final Clinical Impressions(s) / ED Diagnoses   Final diagnoses:  Shortness of breath  Acute bronchitis, unspecified organism    ED Discharge Orders         Ordered    levofloxacin (LEVAQUIN) 500 MG tablet  Daily     04/21/18 2109           Jola Schmidt, MD 04/21/18 2112

## 2018-04-21 NOTE — ED Triage Notes (Addendum)
Patient arrived from home via [facility] reporting shortness of breath that has been going on for months. He reports feeling chills, nausea, fever, and a productive cough. He reports history of COPD. He said he has been to his PCP several times but did not get result. EMS gave patient 4mg  Zofran, 800mg  Motrin, and 1064ml NS  Patient is A&O on arrival, breathing is labored at rest, Encouraged to use purse lip breathing, he uses 2lit [place] at home, patient is kept on 2lit . Will continue to monitor

## 2018-04-21 NOTE — ED Notes (Signed)
Patient verbalizes understanding of medications and discharge instructions. No further questions at this time. VSS and patient ambulatory at discharge.   

## 2018-05-08 ENCOUNTER — Other Ambulatory Visit: Payer: Self-pay

## 2018-05-08 ENCOUNTER — Emergency Department (HOSPITAL_COMMUNITY)
Admission: EM | Admit: 2018-05-08 | Discharge: 2018-05-09 | Disposition: A | Discharge status: Home / Self Care | Payer: Medicare Other | Attending: Emergency Medicine | Admitting: Emergency Medicine

## 2018-05-08 ENCOUNTER — Emergency Department (HOSPITAL_COMMUNITY): Payer: Medicare Other

## 2018-05-08 ENCOUNTER — Encounter (HOSPITAL_COMMUNITY): Payer: Self-pay | Admitting: *Deleted

## 2018-05-08 DIAGNOSIS — I110 Hypertensive heart disease with heart failure: Secondary | ICD-10-CM | POA: Diagnosis not present

## 2018-05-08 DIAGNOSIS — I5032 Chronic diastolic (congestive) heart failure: Secondary | ICD-10-CM | POA: Diagnosis not present

## 2018-05-08 DIAGNOSIS — R109 Unspecified abdominal pain: Secondary | ICD-10-CM | POA: Diagnosis present

## 2018-05-08 DIAGNOSIS — Z87891 Personal history of nicotine dependence: Secondary | ICD-10-CM | POA: Diagnosis not present

## 2018-05-08 DIAGNOSIS — Z7984 Long term (current) use of oral hypoglycemic drugs: Secondary | ICD-10-CM | POA: Insufficient documentation

## 2018-05-08 DIAGNOSIS — Z7901 Long term (current) use of anticoagulants: Secondary | ICD-10-CM | POA: Insufficient documentation

## 2018-05-08 DIAGNOSIS — Z79899 Other long term (current) drug therapy: Secondary | ICD-10-CM | POA: Diagnosis not present

## 2018-05-08 DIAGNOSIS — E119 Type 2 diabetes mellitus without complications: Secondary | ICD-10-CM | POA: Insufficient documentation

## 2018-05-08 DIAGNOSIS — R1084 Generalized abdominal pain: Secondary | ICD-10-CM | POA: Diagnosis not present

## 2018-05-08 DIAGNOSIS — E039 Hypothyroidism, unspecified: Secondary | ICD-10-CM | POA: Diagnosis not present

## 2018-05-08 DIAGNOSIS — J449 Chronic obstructive pulmonary disease, unspecified: Secondary | ICD-10-CM | POA: Insufficient documentation

## 2018-05-08 LAB — I-STAT CG4 LACTIC ACID, ED: Lactic Acid, Venous: 1.37 mmol/L (ref 0.5–1.9)

## 2018-05-08 LAB — COMPREHENSIVE METABOLIC PANEL
ALT: 19 U/L (ref 0–44)
AST: 25 U/L (ref 15–41)
Albumin: 3.8 g/dL (ref 3.5–5.0)
Alkaline Phosphatase: 59 U/L (ref 38–126)
Anion gap: 11 (ref 5–15)
BUN: 18 mg/dL (ref 8–23)
CO2: 22 mmol/L (ref 22–32)
Calcium: 9.4 mg/dL (ref 8.9–10.3)
Chloride: 101 mmol/L (ref 98–111)
Creatinine, Ser: 1.67 mg/dL — ABNORMAL HIGH (ref 0.61–1.24)
GFR calc Af Amer: 48 mL/min — ABNORMAL LOW (ref >60)
GFR calc non Af Amer: 41 mL/min — ABNORMAL LOW (ref >60)
Glucose, Bld: 206 mg/dL — ABNORMAL HIGH (ref 70–99)
Potassium: 5.4 mmol/L — ABNORMAL HIGH (ref 3.5–5.1)
Sodium: 134 mmol/L — ABNORMAL LOW (ref 135–145)
Total Bilirubin: 1.7 mg/dL — ABNORMAL HIGH (ref 0.3–1.2)
Total Protein: 7.3 g/dL (ref 6.5–8.1)

## 2018-05-08 LAB — CBC
HCT: 38.8 % — ABNORMAL LOW (ref 39.0–52.0)
Hemoglobin: 11.7 g/dL — ABNORMAL LOW (ref 13.0–17.0)
MCH: 27.8 pg (ref 26.0–34.0)
MCHC: 30.2 g/dL (ref 30.0–36.0)
MCV: 92.2 fL (ref 80.0–100.0)
Platelets: 135 10*3/uL — ABNORMAL LOW (ref 150–400)
RBC: 4.21 MIL/uL — ABNORMAL LOW (ref 4.22–5.81)
RDW: 15.4 % (ref 11.5–15.5)
WBC: 9.1 10*3/uL (ref 4.0–10.5)
nRBC: 0 % (ref 0.0–0.2)

## 2018-05-08 LAB — I-STAT CHEM 8, ED
BUN: 17 mg/dL (ref 8–23)
Calcium, Ion: 0.96 mmol/L — ABNORMAL LOW (ref 1.15–1.40)
Chloride: 104 mmol/L (ref 98–111)
Creatinine, Ser: 1.2 mg/dL (ref 0.61–1.24)
Glucose, Bld: 174 mg/dL — ABNORMAL HIGH (ref 70–99)
HCT: 31 % — ABNORMAL LOW (ref 39.0–52.0)
Hemoglobin: 10.5 g/dL — ABNORMAL LOW (ref 13.0–17.0)
Potassium: 4.3 mmol/L (ref 3.5–5.1)
Sodium: 136 mmol/L (ref 135–145)
TCO2: 21 mmol/L — ABNORMAL LOW (ref 22–32)

## 2018-05-08 LAB — LIPASE, BLOOD: Lipase: 26 U/L (ref 11–51)

## 2018-05-08 MED ORDER — ONDANSETRON HCL 4 MG PO TABS: 4.0000 mg | ORAL_TABLET | Freq: Three times a day (TID) | ORAL | 0 refills | Status: DC | PRN

## 2018-05-08 MED ORDER — HYDROMORPHONE HCL 1 MG/ML IJ SOLN
1.0000 mg | Freq: Once | INTRAMUSCULAR | Status: AC
  Administered 2018-05-08: 1 mg via INTRAVENOUS
  Filled 2018-05-08: qty 1

## 2018-05-08 MED ORDER — HYDROCODONE-ACETAMINOPHEN 5-325 MG PO TABS: 1.0000 | ORAL_TABLET | Freq: Four times a day (QID) | ORAL | 0 refills | Status: AC | PRN

## 2018-05-08 MED ORDER — POLYETHYLENE GLYCOL 3350 17 GM/SCOOP PO POWD: 17.0000 g | Freq: Every day | ORAL | 0 refills | Status: DC

## 2018-05-08 MED ORDER — FENTANYL CITRATE (PF) 100 MCG/2ML IJ SOLN
50.0000 ug | INTRAMUSCULAR | Status: DC | PRN
  Administered 2018-05-08: 50 ug via INTRAVENOUS
  Filled 2018-05-08: qty 2

## 2018-05-08 MED ORDER — ONDANSETRON HCL 4 MG/2ML IJ SOLN
4.0000 mg | Freq: Once | INTRAMUSCULAR | Status: AC
  Administered 2018-05-08: 4 mg via INTRAVENOUS
  Filled 2018-05-08: qty 2

## 2018-05-08 MED ORDER — IOHEXOL 300 MG/ML  SOLN
100.0000 mL | Freq: Once | INTRAMUSCULAR | Status: AC | PRN
  Administered 2018-05-08: 100 mL via INTRAVENOUS

## 2018-05-08 NOTE — ED Notes (Signed)
Patient given PO fluids, tolerating well at this time.

## 2018-05-08 NOTE — ED Provider Notes (Signed)
[facility] EMERGENCY DEPARTMENT Provider Note   CSN: [id number] Arrival date & time: 05/08/18  1941   History   Chief Complaint Chief Complaint  Patient presents with  . Abdominal Pain    HPI [name] is a 68 y.o. male with a PMH of COPD, OSA, hypertension, T2DM, first-degree AV block status post ablation, CHF and history of atrial fibrillation.  Patient reporting he had sudden onset of acute abdominal pain starting this afternoon.  Reports that he had normal bowel movement this morning.  Denies any blood in his stool or dark stools.  Patient reports that he has known umbilical hernia.  Does report that he feels as if his abdomen is also distended.  Denies any pain radiating anywhere.  Denies any numbness or tingling.  Denies any trauma.  Patient reports that the pain feels like a sharp and dull aching.  It has been constant.  The fentanyl helped some in the ED.  Patient denies any nausea or vomiting, diarrhea, constipation.  Patient denies any fevers or chills.  Also denies any sick contacts.  The history is provided by the patient and the spouse.    Past Medical History:  Diagnosis Date  . Aortic stenosis, mild   . Arthritis of hand    "just a little bit in both hands" (03/31/2012)  . Asthma    "little bit" (03/31/2012)  . Atrial fibrillation [facility])    dx '04; DCCV '04, placed on flecainide, failed DCCV 04/2010, flecainide stopped; s/p successful A.fib ablation 01/31/12  . CHF (congestive heart failure) ([place])   . Diabetes mellitus without complication (HCC)    borderline  . DJD (degenerative joint disease)   . Exertional dyspnea   . Fibromyalgia   . First degree AV block    post ablation, ~439ms  . GERD (gastroesophageal reflux disease)   . Heart murmur   . Hyperlipidemia   . Hypertension   . Hypothyroidism    S/P radiation  . Left atrial enlargement    LA size 42mm by echo 11/21/11  . Mitral regurgitation    trivial  . Obesity   . Obstructive  sleep apnea    mild by sleep study 2013; pt stated he does not have a machine because "it wasn't bad enough for him to have one"  . Pacemaker 03/31/2012  . Restless leg syndrome   . Second degree AV block   . Visit for monitoring Tikosyn therapy 04/16/2016    Patient Active Problem List   Diagnosis Date Noted  . Chronic respiratory failure ([place]) 05/23/2016  . CAD (coronary artery disease) 05/23/2016  . Persistent atrial fibrillation   . Atypical atrial flutter ([place])   . Visit for monitoring Tikosyn therapy 04/16/2016  . Diastolic heart failure ([place]) 02/03/2016  . Cough 12/06/2015  . Hallucinations 09/04/2015  . Lymphadenopathy 09/04/2015  . Ascending aortic aneurysm ([place]) 09/04/2015  . Hypoxia 09/02/2015  . S/P aortic valve replacement with bioprosthetic valve 09/02/2015  . Pleural effusion 09/02/2015  . S/P AVR 08/22/2015  . GERD (gastroesophageal reflux disease) 09/20/2014  . 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 Dyspnea 09/15/2013  . Pulmonary hypertension ([place]) 09/15/2013  . Shortness of breath 05/05/2013  . 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 Bradycardia 03/31/2012  . Second degree AV block 03/31/2012  . AV block, 1st degree 02/01/2012  . PAF (paroxysmal atrial fibrillation) ([place]) 11/13/2011  . Fatigue 11/13/2011  . Hypertension 11/13/2011  . Aortic stenosis 11/13/2011  . Sleep apnea 11/13/2011    Past Surgical  History:  Procedure Laterality Date  . AORTIC VALVE REPLACEMENT N/A 08/22/2015   Procedure: AORTIC VALVE REPLACEMENT (AVR);  Surgeon: Ivin Poot, MD;  Location: [facility];  Service: Open Heart Surgery;  Laterality: N/A;  . ATRIAL FIBRILLATION ABLATION  01/30/2012   PVI by Dr. Rayann Heman  . ATRIAL FIBRILLATION ABLATION N/A 01/31/2012   Procedure: ATRIAL FIBRILLATION ABLATION;  Surgeon: Thompson Grayer, MD;  Location[facility] CATH LAB;  Service: Cardiovascular;  Laterality: N/A;  . BACK SURGERY     X 3  . CARDIAC CATHETERIZATION N/A 08/09/2015   Procedure: Right/Left Heart Cath and  Coronary Angiography;  Surgeon: Peter M Martinique, MD;  Location: [place] CV LAB;  Service: Cardiovascular;  Laterality: N/A;  . CARDIAC CATHETERIZATION N/A 02/05/2016   Procedure: Right/Left Heart Cath and Coronary Angiography;  Surgeon: Jettie Booze, MD;  Location: [place] CV LAB;  Service: Cardiovascular;  Laterality: N/A;  . CARDIAC CATHETERIZATION N/A 04/17/2016   Procedure: Right Heart Cath;  Surgeon: Jolaine Artist, MD;  Location: [place] CV LAB;  Service: Cardiovascular;  Laterality: N/A;  . CARDIOVASCULAR STRESS TEST  03/12/2002   EF 48%, NO EVIDENCE OF ISCHEMIA  . CARDIOVERSION  01/2012; 03/31/2012  . CARDIOVERSION N/A 02/25/2012   Procedure: CARDIOVERSION;  Surgeon: Thompson Grayer, MD;  Location: [facility] CATH LAB;  Service: Cardiovascular;  Laterality: N/A;  . CARDIOVERSION N/A 03/31/2012   Procedure: CARDIOVERSION;  Surgeon: Thompson Grayer, MD;  Location: [facility] CATH LAB;  Service: Cardiovascular;  Laterality: N/A;  . CARDIOVERSION N/A 11/23/2015   Procedure: CARDIOVERSION;  Surgeon: Larey Dresser, MD;  Location: [facility] ENDOSCOPY;  Service: Cardiovascular;  Laterality: N/A;  . CARDIOVERSION N/A 04/08/2016   Procedure: CARDIOVERSION;  Surgeon: Jolaine Artist, MD;  Location: [facility] ENDOSCOPY;  Service: Cardiovascular;  Laterality: N/A;  . CLIPPING OF ATRIAL APPENDAGE N/A 08/22/2015   Procedure: CLIPPING OF ATRIAL APPENDAGE;  Surgeon: Ivin Poot, MD;  Location: [place];  Service: Open Heart Surgery;  Laterality: N/A;  . DOPPLER ECHOCARDIOGRAPHY  03/11/2002   EF 70-75%  . FINGER SURGERY Left    Middle finger  . FINGER TENDON REPAIR  1980's   "right little finger" (03/31/2012)  . INSERT / REPLACE / REMOVE PACEMAKER     St Jude  . LEFT AND RIGHT HEART CATHETERIZATION WITH CORONARY ANGIOGRAM N/A 10/27/2012   Procedure: LEFT AND RIGHT HEART CATHETERIZATION WITH CORONARY ANGIOGRAM;  Surgeon: Laverda Page, MD;  Location: [facility] CATH LAB;  Service: Cardiovascular;  Laterality: N/A;  .  LUMBAR DISC SURGERY  1980's X2;  2000's  . MAZE N/A 08/22/2015   Procedure: MAZE;  Surgeon: Ivin Poot, MD;  Location: [place];  Service: Open Heart Surgery;  Laterality: N/A;  . PACEMAKER INSERTION  03/31/2012   STJ Accent DR pacemaker implanted by Dr Rayann Heman  . PERMANENT PACEMAKER INSERTION N/A 03/31/2012   Procedure: PERMANENT PACEMAKER INSERTION;  Surgeon: Thompson Grayer, MD;  Location: [facility] CATH LAB;  Service: Cardiovascular;  Laterality: N/A;  . TEE WITHOUT CARDIOVERSION  01/30/2012   Procedure: TRANSESOPHAGEAL ECHOCARDIOGRAM (TEE);  Surgeon: Larey Dresser, MD;  Location: [place];  Service: Cardiovascular;  Laterality: N/A;  ablation next day  . TEE WITHOUT CARDIOVERSION N/A 08/22/2015   Procedure: TRANSESOPHAGEAL ECHOCARDIOGRAM (TEE);  Surgeon: Ivin Poot, MD;  Location: [place];  Service: Open Heart Surgery;  Laterality: N/A;  . US ECHOCARDIOGRAPHY  08/07/2009   EF 55-60%      Home Medications    Prior to Admission medications  Medication Sig Start Date End Date Taking? Authorizing Provider  albuterol (PROVENTIL HFA;VENTOLIN HFA) 108 (90 Base) MCG/ACT inhaler Inhale 2 puffs into the lungs every 6 (six) hours as needed for wheezing or shortness of breath. 12/28/15  Yes Collene Gobble, MD  amLODipine (NORVASC) 10 MG tablet Take 0.5 tablets (5 mg total) by mouth daily. 09/18/15  Yes Nahser, Wonda Cheng, MD  apixaban (ELIQUIS) 5 MG TABS tablet Take 1 tablet (5 mg total) by mouth 2 (two) times daily. 05/26/17  Yes Bensimhon, Shaune Pascal, MD  budesonide-formoterol [facility]) 160-4.5 MCG/ACT inhaler Inhale 2 puffs into the lungs 2 (two) times daily. 11/13/15  Yes Collene Gobble, MD  Cholecalciferol (VITAMIN D) 2000 UNITS CAPS Take 2,000 Units by mouth daily.   Yes [provider]  dofetilide (TIKOSYN) 250 MCG capsule Take 1 capsule (250 mcg total) by mouth 2 (two) times daily. 03/09/18  Yes Allred, Jeneen Rinks, MD  fexofenadine (ALLEGRA) 180 MG tablet Take 1 tablet (180 mg total) by mouth  daily. 03/01/14  Yes Collene Gobble, MD  gabapentin (NEURONTIN) 300 MG capsule Take 600-1,200 mg by mouth See admin instructions. Take 600 mg by mouth in the morning, 600 mg at lunchtime, and 1,200 mg at bedtime 09/13/14  Yes [provider]  guaiFENesin (MUCINEX) 600 MG 12 hr tablet Take 1,200 mg by mouth 2 (two) times daily.    Yes [provider]  levothyroxine (SYNTHROID, LEVOTHROID) 175 MCG tablet Take 175 mcg by mouth daily before breakfast.   Yes [provider]  MAGNESIUM PO Take 1 tablet by mouth at bedtime.   Yes [provider]  metFORMIN (GLUCOPHAGE-XR) 500 MG 24 hr tablet Take 500 mg by mouth at bedtime.   Yes [provider]  metoprolol tartrate (LOPRESSOR) 50 MG tablet Take 1 tablet (50 mg total) by mouth 2 (two) times daily. Needs office visit [phone] 07/22/17  Yes Bensimhon, Shaune Pascal, MD  ondansetron (ZOFRAN ODT) 8 MG disintegrating tablet Take 1 tablet (8 mg total) by mouth every 8 (eight) hours as needed for nausea or vomiting. 04/21/18  Yes Jola Schmidt, MD  pantoprazole (PROTONIX) 40 MG tablet Take 40 mg by mouth daily.   Yes [provider]  potassium chloride (K-DUR,KLOR-CON) 10 MEQ tablet TAKE 2 TABLETS BY MOUTH TWO TIMES DAILY Patient taking differently: Take 20 mEq by mouth 2 (two) times daily.  01/29/18  Yes Bensimhon, Shaune Pascal, MD  rOPINIRole (REQUIP) 1 MG tablet Take 1 tablet (1 mg total) by mouth daily. Patient taking differently: Take 1 mg by mouth at bedtime.  12/24/13  Yes Collene Gobble, MD  rosuvastatin (CRESTOR) 10 MG tablet Take 10 mg by mouth at bedtime.  08/08/16  Yes [provider]  spironolactone (ALDACTONE) 25 MG tablet Take 1 tablet (25 mg total) by mouth daily. 02/16/16  Yes Shirley Friar, PA-C  torsemide (DEMADEX) 20 MG tablet Take 2 tablets (40 mg total) by mouth every other day. 10/29/17  Yes Bensimhon, Shaune Pascal, MD  traMADol-acetaminophen (ULTRACET) 37.5-325 MG tablet Take 2  tablets by mouth 2 (two) times daily.   Yes [provider]  traZODone (DESYREL) 50 MG tablet Take 12.5 mg by mouth at bedtime.    Yes [provider]  HYDROcodone-acetaminophen (NORCO/VICODIN) 5-325 MG tablet Take 1 tablet by mouth every 6 (six) hours as needed for up to 10 days for moderate pain. 05/08/18 05/18/18  Shirley, [place], DO  ondansetron (ZOFRAN) 4 MG tablet Take 1 tablet (4 mg total) by  mouth every 8 (eight) hours as needed for nausea or vomiting. 05/08/18   Shirley, [place], DO  polyethylene glycol powder (GLYCOLAX/MIRALAX) powder Take 17 g by mouth daily. 05/08/18   Shirley, [place], DO    Family History Family History  Problem Relation Age of Onset  . Heart failure Mother   . Stroke Father     Social History Social History   Tobacco Use  . Smoking status: Former Smoker    Packs/day: 0.50    Years: 15.00    Pack years: 7.50    Types: Cigarettes    Last attempt to quit: 07/18/1991    Years since quitting: 26.8  . Smokeless tobacco: Never Used  Substance Use Topics  . Alcohol use: Yes    Comment: occasional  . Drug use: Yes    Types: Marijuana     Allergies   Meloxicam and Vancomycin   Review of Systems Review of Systems  Constitutional: Negative for chills and fever.  HENT: Positive for rhinorrhea.   Respiratory: Positive for cough. Negative for chest tightness and shortness of breath.   Cardiovascular: Positive for leg swelling. Negative for chest pain.  Gastrointestinal: Negative for blood in stool, constipation, diarrhea, nausea and vomiting.  Genitourinary: Negative for difficulty urinating and dysuria.  Musculoskeletal: Positive for back pain.  Neurological: Negative for dizziness and weakness.  All other systems reviewed and are negative.   Physical Exam Updated Vital Signs BP (!) 143/75   Pulse 70   Temp 97.9 F (36.6 C) (Oral)   Resp (!) 25   SpO2 90%   Physical Exam Vitals signs and nursing note reviewed.  Constitutional:       Appearance: He is well-developed. He is obese.  HENT:     Head: Normocephalic and atraumatic.  Eyes:     Extraocular Movements: Extraocular movements intact.     Pupils: Pupils are equal, round, and reactive to light.  Cardiovascular:     Rate and Rhythm: Normal rate and regular rhythm.     Heart sounds: Normal heart sounds.  Pulmonary:     Effort: Pulmonary effort is normal.     Breath sounds: Normal breath sounds.     Comments: On home 2L [place] Abdominal:     General: Bowel sounds are normal. There is distension.     Palpations: Abdomen is soft. There is no fluid wave or splenomegaly.     Tenderness: There is generalized abdominal tenderness. There is no guarding.     Hernia: A hernia is present. Hernia is present in the umbilical area.  Skin:    General: Skin is warm.     Capillary Refill: Capillary refill takes less than 2 seconds.  Neurological:     General: No focal deficit present.     Mental Status: He is alert and oriented to person, place, and time.  Psychiatric:        Mood and Affect: Mood normal.        Behavior: Behavior normal.     ED Treatments / Results  Labs (all labs ordered are listed, but only abnormal results are displayed) Labs Reviewed  COMPREHENSIVE METABOLIC PANEL - Abnormal; Notable for the following components:      Result Value   Sodium 134 (*)    Potassium 5.4 (*)    Glucose, Bld 206 (*)    Creatinine, Ser 1.67 (*)    Total Bilirubin 1.7 (*)    GFR calc non Af Amer 41 (*)    GFR calc Af  Amer 48 (*)    All other components within normal limits  CBC - Abnormal; Notable for the following components:   RBC 4.21 (*)    Hemoglobin 11.7 (*)    HCT 38.8 (*)    Platelets 135 (*)    All other components within normal limits  I-STAT CHEM 8, ED - Abnormal; Notable for the following components:   Glucose, Bld 174 (*)    Calcium, Ion 0.96 (*)    TCO2 21 (*)    Hemoglobin 10.5 (*)    HCT 31.0 (*)    All other components within normal limits    LIPASE, BLOOD  URINALYSIS, ROUTINE W REFLEX MICROSCOPIC  I-STAT CG4 LACTIC ACID, ED  I-STAT CG4 LACTIC ACID, ED    EKG EKG Interpretation  Date/Time:  Friday May 08 2018 20:49:39 EST Ventricular Rate:  70 PR Interval:    QRS Duration: 166 QT Interval:  460 QTC Calculation: 497 R Axis:   -73 Text Interpretation:  Sinus rhythm Nonspecific IVCD with LAD LVH with secondary repolarization abnormality No significant change since last tracing Confirmed by Wandra Arthurs [phone]) on 05/08/2018 9:17:47 PM   Radiology Ct Abdomen Pelvis W Contrast  Result Date: 05/08/2018 CLINICAL DATA:  Generalized abdominal pain since afternoon. Nausea and vomiting. Increased edema to the legs. History of umbilical hernia. EXAM: CT ABDOMEN AND PELVIS WITH CONTRAST TECHNIQUE: Multidetector CT imaging of the abdomen and pelvis was performed using the standard protocol following bolus administration of intravenous contrast. CONTRAST:  115mL OMNIPAQUE IOHEXOL 300 MG/ML  SOLN COMPARISON:  None. FINDINGS: Lower chest: Motion artifact limits examination. No focal consolidation. Postoperative changes in the mediastinum. Hepatobiliary: Mild diffuse fatty infiltration of the liver. No focal lesions. Small stone in the gallbladder. No inflammatory changes. No bile duct dilatation. Pancreas: Unremarkable. No pancreatic ductal dilatation or surrounding inflammatory changes. Spleen: Normal in size without focal abnormality. Adrenals/Urinary Tract: No adrenal gland nodules. Diffuse atrophy of the right kidney. Nephrograms are symmetrical. 12 mm low-attenuation lesion on the left kidney possibly with some contrast enhancement. Possible adenoma. Consider elective follow-up with renal protocol CT or MRI for further evaluation. No hydronephrosis. Bladder is unremarkable. Stomach/Bowel: Stomach, small bowel, and colon are not abnormally distended. No wall thickening or infiltration identified. Appendix is normal. Vascular/Lymphatic:  Aortic atherosclerosis. No enlarged abdominal or pelvic lymph nodes. Reproductive: Prostate is unremarkable. Other: Small left inguinal hernia containing fat. Small periumbilical hernia containing fat. No free air or free fluid in the abdomen. No bowel herniation. Musculoskeletal: No acute or significant osseous findings. IMPRESSION: 1. No acute process demonstrated in the abdomen or pelvis. No evidence of bowel obstruction or inflammation. 2. Mild diffuse fatty infiltration of the liver. 3. Cholelithiasis without inflammatory changes. 4. Diffuse atrophy of the right kidney. 5. 12 mm low-attenuation lesion on the left kidney possibly enhancing. Consider elective follow-up with renal protocol CT or MRI for further evaluation. 6. Small left inguinal and periumbilical hernias containing fat. Aortic Atherosclerosis (ICD10-I70.0). Electronically Signed   By: Lucienne Capers M.D.   On: 05/08/2018 22:32    Procedures Procedures (including critical care time)  Medications Ordered in ED Medications  fentaNYL (SUBLIMAZE) injection 50 mcg (50 mcg Intravenous Given 05/08/18 2034)  ondansetron (ZOFRAN) injection 4 mg (4 mg Intravenous Given 05/08/18 2034)  HYDROmorphone (DILAUDID) injection 1 mg (1 mg Intravenous Given 05/08/18 2144)  iohexol (OMNIPAQUE) 300 MG/ML solution 100 mL (100 mLs Intravenous Contrast Given 05/08/18 2207)     Initial Impression / Assessment and Plan /  ED Course  I have reviewed the triage vital signs and the nursing notes.  Pertinent labs & imaging results that were available during my care of the patient were reviewed by me and considered in my medical decision making (see chart for details).    This 68 year old male presenting with acute abdominal pain with unclear etiology.  Patient does have umbilical hernia noted on exam.  Generalized tenderness.  Patient with CMP, CBC, lipase grossly normal except for potassium of 5.4 likely due to hemolysis.  Will repeat.  Will also obtain CT  abdomen pelvis.  CT abdomen pelvis shows mild diffuse fatty infiltration of liver with small umbilical and inguinal hernias that contain fat.  No acute process noted otherwise.  Repeat potassium normal.  Unclear etiology of abdominal pain although patient very tender to exam on generalized exam with some distention noted.  Given that CT scan grossly normal will send home with treatment for constipation with MiraLAX, nausea with Zofran, and Vicodin for pain.  Patient given strict return precautions and instructed to follow-up with regular doctor.  [place] Shirley, DO PGY-2, [facility]   Final Clinical Impressions(s) / ED Diagnoses   Final diagnoses:  Generalized abdominal pain    ED Discharge Orders         Ordered    HYDROcodone-acetaminophen (NORCO/VICODIN) 5-325 MG tablet  Every 6 hours PRN     05/08/18 2340    polyethylene glycol powder (GLYCOLAX/MIRALAX) powder  Daily     05/08/18 2340    ondansetron (ZOFRAN) 4 MG tablet  Every 8 hours PRN     05/08/18 [place], [place], DO 05/08/18 2344    Drenda Freeze, MD 05/11/18 1443

## 2018-05-08 NOTE — ED Triage Notes (Addendum)
Pt c/o generalized abd pain onset this afternoon with nausea and spitting up. Pt reports increased edema to bilateral legs and abd. Hx of umbilical hernia. Pt diaphoretic and very uncomfortable in triage

## 2018-05-08 NOTE — Discharge Instructions (Signed)
It is unclear what the cause of your abdominal pain is.  The CT scan that we obtained did not show any thing concerning.  We will send you home with Zofran as needed for nausea and vomiting.  Also we will send you home with MiraLAX as needed for any constipation you may develop.  You may take Vicodin for severe abdominal pain.  Please follow-up with your regular doctor if the pain does not resolve. Also if you develop any worsening of your symptoms, blood in your stool or worsening vomiting and diarrhea then do not hesitate to come back to the emergency room.

## 2018-05-09 NOTE — ED Notes (Signed)
Patient verbalizes understanding of medications and discharge instructions. No further questions at this time. VSS and patient ambulatory at discharge.   

## 2018-05-14 ENCOUNTER — Telehealth: Payer: Self-pay | Admitting: Internal Medicine

## 2018-05-14 NOTE — Telephone Encounter (Signed)
New Message   Pt c/o medication issue:  1. Name of Medication: Tikosyn  2. How are you currently taking this medication (dosage and times per day)? 250MG   2x daily   3. Are you having a reaction (difficulty breathing--STAT)? No  4. What is your medication issue? PT says they have been getting the medication through the manufacturer and they cant provide it anymore. She called insurance and they wont cover it but they have a generic and they're unsure if its as good as the Tikosyn  Please call

## 2018-05-14 NOTE — Telephone Encounter (Signed)
Call returned to wife.  Advised dofetilide is the same as Tikosyn.  Pt wife is going to shop around and check out prices and call this nurse back when she has found where she would like the prescription to go.

## 2018-05-18 ENCOUNTER — Ambulatory Visit (INDEPENDENT_AMBULATORY_CARE_PROVIDER_SITE_OTHER): Payer: Medicare Other

## 2018-05-18 DIAGNOSIS — I5032 Chronic diastolic (congestive) heart failure: Secondary | ICD-10-CM

## 2018-05-18 DIAGNOSIS — I480 Paroxysmal atrial fibrillation: Secondary | ICD-10-CM

## 2018-05-18 LAB — CUP PACEART REMOTE DEVICE CHECK
Battery Remaining Longevity: 46 mo
Battery Remaining Percentage: 40 %
Battery Voltage: 2.84 V
Brady Statistic AP VP Percent: 57 %
Brady Statistic AP VS Percent: 1 %
Brady Statistic AS VP Percent: 43 %
Brady Statistic AS VS Percent: 1 %
Brady Statistic RA Percent Paced: 35 %
Brady Statistic RV Percent Paced: 99 %
Date Time Interrogation Session: 20200120092133
Implantable Lead Implant Date: 20131203
Implantable Lead Implant Date: 20131203
Implantable Lead Location: 753859
Implantable Lead Location: 753860
Implantable Lead Model: 1948
Implantable Pulse Generator Implant Date: 20131203
Lead Channel Impedance Value: 410 Ohm
Lead Channel Impedance Value: 650 Ohm
Lead Channel Pacing Threshold Amplitude: 0.75 V
Lead Channel Pacing Threshold Amplitude: 0.875 V
Lead Channel Pacing Threshold Pulse Width: 0.5 ms
Lead Channel Pacing Threshold Pulse Width: 0.8 ms
Lead Channel Sensing Intrinsic Amplitude: 3.7 mV
Lead Channel Sensing Intrinsic Amplitude: 8.8 mV
Lead Channel Setting Pacing Amplitude: 1 V
Lead Channel Setting Pacing Amplitude: 1.875
Lead Channel Setting Pacing Pulse Width: 0.8 ms
Lead Channel Setting Sensing Sensitivity: 4 mV
Pulse Gen Model: 2210
Pulse Gen Serial Number: 7408461

## 2018-05-18 NOTE — Progress Notes (Signed)
Remote pacemaker transmission.   

## 2018-06-10 ENCOUNTER — Other Ambulatory Visit (HOSPITAL_COMMUNITY): Payer: Self-pay | Admitting: Internal Medicine

## 2018-06-10 DIAGNOSIS — M79661 Pain in right lower leg: Secondary | ICD-10-CM

## 2018-06-11 ENCOUNTER — Ambulatory Visit (HOSPITAL_COMMUNITY)
Admission: RE | Admit: 2018-06-11 | Discharge: 2018-06-11 | Disposition: A | Discharge status: Home / Self Care | Payer: Medicare Other | Source: Ambulatory Visit | Attending: Vascular Surgery | Admitting: Vascular Surgery

## 2018-06-11 DIAGNOSIS — M79661 Pain in right lower leg: Secondary | ICD-10-CM | POA: Diagnosis present

## 2018-06-11 NOTE — Progress Notes (Signed)
Performed RLE Venous Duplex. Relayed preliminary results to [facility] in Dr. Hoover Brunette office. Patient instructed to go home.

## 2018-06-18 ENCOUNTER — Other Ambulatory Visit (HOSPITAL_COMMUNITY): Payer: Self-pay | Admitting: Internal Medicine

## 2018-06-23 ENCOUNTER — Other Ambulatory Visit (HOSPITAL_COMMUNITY): Payer: Self-pay | Admitting: Internal Medicine

## 2018-06-23 ENCOUNTER — Telehealth: Payer: Self-pay | Admitting: Internal Medicine

## 2018-06-23 DIAGNOSIS — M79606 Pain in leg, unspecified: Secondary | ICD-10-CM

## 2018-06-23 NOTE — Telephone Encounter (Signed)
New Message    Pt c/o medication issue:  1. Name of Medication: dofetilide (TIKOSYN) 250 MCG capsule   2. How are you currently taking this medication (dosage and times per day)?   3. Are you having a reaction (difficulty breathing--STAT)?   4. What is your medication issue?Patients wife is calling to request to be able to pick up the rx for the generic tikosyn so that she can go to the drug store and [employer] [facility]. Please call.

## 2018-06-24 ENCOUNTER — Ambulatory Visit (HOSPITAL_COMMUNITY)
Admission: RE | Admit: 2018-06-24 | Discharge: 2018-06-24 | Disposition: A | Discharge status: Home / Self Care | Payer: Medicare Other | Source: Ambulatory Visit | Attending: Vascular Surgery | Admitting: Vascular Surgery

## 2018-06-24 DIAGNOSIS — M79606 Pain in leg, unspecified: Secondary | ICD-10-CM | POA: Diagnosis present

## 2018-06-24 MED ORDER — DOFETILIDE 250 MCG PO CAPS: 250.0000 ug | ORAL_CAPSULE | Freq: Two times a day (BID) | ORAL | 0 refills | Status: DC

## 2018-06-24 NOTE — Telephone Encounter (Signed)
  Wife is calling because she needs to pick up a prescription for dofetilide (TIKOSYN) 250 MCG capsule to take to [facility]. She would like to come by and pick it up.

## 2018-06-24 NOTE — Telephone Encounter (Signed)
Printed prescription left up front for Pt wife.

## 2018-07-01 ENCOUNTER — Ambulatory Visit (HOSPITAL_COMMUNITY)
Admission: RE | Admit: 2018-07-01 | Discharge: 2018-07-01 | Disposition: A | Discharge status: Home / Self Care | Payer: Medicare Other | Source: Ambulatory Visit | Attending: Internal Medicine | Admitting: Internal Medicine

## 2018-07-01 VITALS — BP 140/72 | HR 70 | Wt 219.0 lb

## 2018-07-01 DIAGNOSIS — G4733 Obstructive sleep apnea (adult) (pediatric): Secondary | ICD-10-CM | POA: Diagnosis not present

## 2018-07-01 DIAGNOSIS — I110 Hypertensive heart disease with heart failure: Secondary | ICD-10-CM | POA: Diagnosis not present

## 2018-07-01 DIAGNOSIS — Z952 Presence of prosthetic heart valve: Secondary | ICD-10-CM | POA: Diagnosis not present

## 2018-07-01 DIAGNOSIS — M79604 Pain in right leg: Secondary | ICD-10-CM | POA: Insufficient documentation

## 2018-07-01 DIAGNOSIS — E119 Type 2 diabetes mellitus without complications: Secondary | ICD-10-CM | POA: Insufficient documentation

## 2018-07-01 DIAGNOSIS — J9611 Chronic respiratory failure with hypoxia: Secondary | ICD-10-CM | POA: Diagnosis not present

## 2018-07-01 DIAGNOSIS — I350 Nonrheumatic aortic (valve) stenosis: Secondary | ICD-10-CM | POA: Insufficient documentation

## 2018-07-01 DIAGNOSIS — Z79899 Other long term (current) drug therapy: Secondary | ICD-10-CM | POA: Insufficient documentation

## 2018-07-01 DIAGNOSIS — Z9981 Dependence on supplemental oxygen: Secondary | ICD-10-CM | POA: Diagnosis not present

## 2018-07-01 DIAGNOSIS — Z7984 Long term (current) use of oral hypoglycemic drugs: Secondary | ICD-10-CM | POA: Diagnosis not present

## 2018-07-01 DIAGNOSIS — E039 Hypothyroidism, unspecified: Secondary | ICD-10-CM | POA: Insufficient documentation

## 2018-07-01 DIAGNOSIS — I480 Paroxysmal atrial fibrillation: Secondary | ICD-10-CM | POA: Diagnosis not present

## 2018-07-01 DIAGNOSIS — E785 Hyperlipidemia, unspecified: Secondary | ICD-10-CM | POA: Diagnosis not present

## 2018-07-01 DIAGNOSIS — Z7901 Long term (current) use of anticoagulants: Secondary | ICD-10-CM | POA: Diagnosis not present

## 2018-07-01 DIAGNOSIS — I5032 Chronic diastolic (congestive) heart failure: Secondary | ICD-10-CM | POA: Diagnosis not present

## 2018-07-01 DIAGNOSIS — M797 Fibromyalgia: Secondary | ICD-10-CM | POA: Insufficient documentation

## 2018-07-01 DIAGNOSIS — I10 Essential (primary) hypertension: Secondary | ICD-10-CM | POA: Diagnosis not present

## 2018-07-01 DIAGNOSIS — G2581 Restless legs syndrome: Secondary | ICD-10-CM | POA: Diagnosis not present

## 2018-07-01 DIAGNOSIS — I272 Pulmonary hypertension, unspecified: Secondary | ICD-10-CM | POA: Diagnosis not present

## 2018-07-01 DIAGNOSIS — Z9889 Other specified postprocedural states: Secondary | ICD-10-CM | POA: Insufficient documentation

## 2018-07-01 DIAGNOSIS — J449 Chronic obstructive pulmonary disease, unspecified: Secondary | ICD-10-CM | POA: Diagnosis not present

## 2018-07-01 DIAGNOSIS — Z87891 Personal history of nicotine dependence: Secondary | ICD-10-CM | POA: Diagnosis not present

## 2018-07-01 DIAGNOSIS — I2510 Atherosclerotic heart disease of native coronary artery without angina pectoris: Secondary | ICD-10-CM | POA: Insufficient documentation

## 2018-07-01 DIAGNOSIS — N179 Acute kidney failure, unspecified: Secondary | ICD-10-CM | POA: Insufficient documentation

## 2018-07-01 DIAGNOSIS — K219 Gastro-esophageal reflux disease without esophagitis: Secondary | ICD-10-CM | POA: Diagnosis not present

## 2018-07-01 DIAGNOSIS — Z8249 Family history of ischemic heart disease and other diseases of the circulatory system: Secondary | ICD-10-CM | POA: Insufficient documentation

## 2018-07-01 LAB — BASIC METABOLIC PANEL
Anion gap: 8 (ref 5–15)
BUN: 13 mg/dL (ref 8–23)
CO2: 27 mmol/L (ref 22–32)
Calcium: 9.2 mg/dL (ref 8.9–10.3)
Chloride: 100 mmol/L (ref 98–111)
Creatinine, Ser: 1.61 mg/dL — ABNORMAL HIGH (ref 0.61–1.24)
GFR calc Af Amer: 50 mL/min — ABNORMAL LOW (ref >60)
GFR calc non Af Amer: 43 mL/min — ABNORMAL LOW (ref >60)
Glucose, Bld: 185 mg/dL — ABNORMAL HIGH (ref 70–99)
Potassium: 4.1 mmol/L (ref 3.5–5.1)
Sodium: 135 mmol/L (ref 135–145)

## 2018-07-01 NOTE — Patient Instructions (Signed)
No medication changes today!!  Labs today We will only contact you if something comes back abnormal or we need to make some changes. Otherwise no news is good news!  Your physician recommends that you schedule a follow-up appointment in: 1 Year. You will be called to schedule this appointment.

## 2018-07-01 NOTE — Addendum Note (Signed)
Encounter addended by: Valeda Malm, RN on: 07/01/2018 10:24 AM  Actions taken: Order list changed, Diagnosis association updated, Clinical Note Signed, Charge Capture section accepted

## 2018-07-01 NOTE — Progress Notes (Signed)
Advanced Heart Failure Clinic Note   Primary Care: Velna Hatchet, MD Primary Cardiologist: Dr. Acie Fredrickson Primary HF: Dr. Haroldine Laws   HPI:  [name] is a 68 y.o. 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   Admitted from Dr. Acie Fredrickson office 02/02/16 with severe DOE with any exercise or walking 10-20 feet. Pt admitted and planned for [facility] 02/05/16. Diuresed with IV lasix with rapid improvement of symptoms. Overall diuresed 15.2 L and down 14 lbs. Discharge weight was 205 lbs.  Echo 02/02/16 LVEF 60-65%, Grade 3 DD. Mild AI, Mild MR, RV mildly dilated and severely reduced. RA mildly dilated. PA peak pressure 74 mm Hg. Concerns for restrictive cardiomyopathy.  L/RHC 02/05/16 with no significant CAD. RHC showed significant difference between PCWP (50) and LVEDP (30) suggestive of MS but no MS on Echo. Weight at d/c was 205 pounds.  Echo 02/08/16 LVEF 55-60%, Restrictive physiology, (?)RV normal. No assessment of PA pressures.  Admitted 12/19 -> 04/19/16 for Tikosyn load. RHC completed during that admission as below.   He presents today for regular follow up. Doing well from a volume status. Taking torsemide 40 qod. Edema relatively well controlled. Main complaint is leg pain. Says legs don't bother when he is walking. When he sits gets sever pain up and down right leg. No CP. Can go to the store without problem. Wears O2 at night. Follows with Dr. Rayann Heman for AF. On tikosyn. Takes Eliquis without bleeding.   ABI 06/24/18 Normal  RHC 04/17/16 RA = 13 RV = 66/3/15 PA = 69/23 (42) PCW = 21 (v=38) Fick cardiac output/index = 6.3/2.9 PVR = 3.8 WU Ao sat = 87% (radial artery ABG) PA sat = 43%, 43% SVC sat = 50%   L/RHC 02/05/16 - Mid RCA lesion, 10 %stenosed. - Ost Cx to Prox Cx lesion, 20 %stenosed. - LV end diastolic pressure is moderately elevated. -  Hemodynamic findings consistent with severe pulmonary hypertension. - No evidence of discordance on the LV/RV tracing. No evidence of pericardial constriction. - Severly elevated PCWP. - PA sat 51%. CO 6.2 L/min. Cardiac index 2.87. - No significant aortic valve gradient. - There is no aortic valve stenosis.  RHC Procedural Findings: Hemodynamics (mmHg) RA mean 30 RV 87/9 (26) PA 100/56 (72) PCWP 51 LVEDP 31 AO 142/73 (100) Cardiac Output (Fick) 6.21 Cardiac Index (Fick) 2.87 PVR = 3.5 WU    PFTs 4/17  FEV1  1.6 (47%) FVC 2.25 (49%) DLCO 54%   Past Medical History:  Diagnosis Date  . Aortic stenosis, mild   . Arthritis of hand    "just a little bit in both hands" (03/31/2012)  . Asthma    "little bit" (03/31/2012)  . Atrial fibrillation [facility])    dx '04; DCCV '04, placed on flecainide, failed DCCV 04/2010, flecainide stopped; s/p successful A.fib ablation 01/31/12  . CHF (congestive heart failure) ([place])   . Diabetes mellitus without complication (HCC)    borderline  . DJD (degenerative joint disease)   . Exertional dyspnea   . Fibromyalgia   . First degree AV block    post ablation, ~444ms  . GERD (gastroesophageal reflux disease)   . Heart murmur   . Hyperlipidemia   . Hypertension   . Hypothyroidism    S/P radiation  . Left atrial enlargement    LA size 51mm by echo 11/21/11  .  Mitral regurgitation    trivial  . Obesity   . Obstructive sleep apnea    mild by sleep study 2013; pt stated he does not have a machine because "it wasn't bad enough for him to have one"  . Pacemaker 03/31/2012  . Restless leg syndrome   . Second degree AV block   . Visit for monitoring Tikosyn therapy 04/16/2016    Current Outpatient Medications  Medication Sig Dispense Refill  . albuterol (PROVENTIL HFA;VENTOLIN HFA) 108 (90 Base) MCG/ACT inhaler Inhale 2 puffs into the lungs every 6 (six) hours as needed for wheezing or shortness of breath. 1 Inhaler 5  . amLODipine (NORVASC)  10 MG tablet Take 0.5 tablets (5 mg total) by mouth daily. 30 tablet 11  . apixaban (ELIQUIS) 5 MG TABS tablet Take 1 tablet (5 mg total) by mouth 2 (two) times daily. 180 tablet 3  . budesonide-formoterol (SYMBICORT) 160-4.5 MCG/ACT inhaler Inhale 2 puffs into the lungs 2 (two) times daily. 1 Inhaler 6  . Cholecalciferol (VITAMIN D) 2000 UNITS CAPS Take 2,000 Units by mouth daily.    [name] dofetilide (TIKOSYN) 250 MCG capsule Take 1 capsule (250 mcg total) by mouth 2 (two) times daily. Keep March appointment for further refills-JS 180 capsule 0  . fexofenadine (ALLEGRA) 180 MG tablet Take 1 tablet (180 mg total) by mouth daily. 30 tablet 1  . gabapentin (NEURONTIN) 300 MG capsule Take 600-1,200 mg by mouth See admin instructions. Take 600 mg by mouth in the morning, 600 mg at lunchtime, and 1,200 mg at bedtime  3  . guaiFENesin (MUCINEX) 600 MG 12 hr tablet Take 1,200 mg by mouth 2 (two) times daily.     [name] levothyroxine (SYNTHROID, LEVOTHROID) 175 MCG tablet Take 175 mcg by mouth daily before breakfast.    . MAGNESIUM PO Take 1 tablet by mouth at bedtime.    . metFORMIN (GLUCOPHAGE-XR) 500 MG 24 hr tablet Take 500 mg by mouth at bedtime.    . metoprolol tartrate (LOPRESSOR) 50 MG tablet Take 1 tablet (50 mg total) by mouth 2 (two) times daily. Needs office visit [phone] 180 tablet 3  . ondansetron (ZOFRAN ODT) 8 MG disintegrating tablet Take 1 tablet (8 mg total) by mouth every 8 (eight) hours as needed for nausea or vomiting. 10 tablet 0  . ondansetron (ZOFRAN) 4 MG tablet Take 1 tablet (4 mg total) by mouth every 8 (eight) hours as needed for nausea or vomiting. 10 tablet 0  . pantoprazole (PROTONIX) 40 MG tablet Take 40 mg by mouth daily.    . polyethylene glycol powder (GLYCOLAX/MIRALAX) powder Take 17 g by mouth daily. 500 g 0  . potassium chloride (K-DUR,KLOR-CON) 10 MEQ tablet TAKE 2 TABLETS BY MOUTH TWO TIMES DAILY (Patient taking differently: Take 20 mEq by mouth 2 (two) times daily. ) 360  tablet 2  . rOPINIRole (REQUIP) 1 MG tablet Take 1 tablet (1 mg total) by mouth daily. (Patient taking differently: Take 1 mg by mouth at bedtime. ) 90 tablet 2  . rosuvastatin (CRESTOR) 10 MG tablet Take 10 mg by mouth at bedtime.     [name] spironolactone (ALDACTONE) 25 MG tablet Take 1 tablet (25 mg total) by mouth daily. 90 tablet 3  . torsemide (DEMADEX) 20 MG tablet TAKE 2 TABLETS BY MOUTH  DAILY 180 tablet 1  . traMADol-acetaminophen (ULTRACET) 37.5-325 MG tablet Take 2 tablets by mouth 2 (two) times daily.    . traZODone (DESYREL) 50 MG tablet Take 12.5 mg by  mouth at bedtime.      No current facility-administered medications for this encounter.     Allergies  Allergen Reactions  . Meloxicam Rash  . 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 syndrome 09/02/15, resolved with diphenhydramine and slowing of rate      Social History   Socioeconomic History  . Marital status: Married    Spouse name: Not on file  . Number of children: Not on file  . Years of education: Not on file  . Highest education level: Not on file  Occupational History  . Not on file  Social Needs  . Financial resource strain: Not on file  . Food insecurity:    Worry: Not on file    Inability: Not on file  . Transportation needs:    Medical: Not on file    Non-medical: Not on file  Tobacco Use  . Smoking status: Former Smoker    Packs/day: 0.50    Years: 15.00    Pack years: 7.50    Types: Cigarettes    Last attempt to quit: 07/18/1991    Years since quitting: 26.9  . Smokeless tobacco: Never Used  Substance and Sexual Activity  . Alcohol use: Yes    Comment: occasional  . Drug use: Yes    Types: Marijuana  . Sexual activity: Not Currently  Lifestyle  . Physical activity:    Days per week: Not on file    Minutes per session: Not on file  . Stress: Not on file  Relationships  . Social connections:    Talks on phone: Not on file    Gets together: Not on file    Attends religious service:  Not on file    Active member of club or organization: Not on file    Attends meetings of clubs or organizations: Not on file    Relationship status: Not on file  . Intimate partner violence:    Fear of current or ex partner: Not on file    Emotionally abused: Not on file    Physically abused: Not on file    Forced sexual activity: Not on file  Other Topics Concern  . Not on file  Social History Narrative   Lives in [place] with spouse.  Works as a Air traffic controller.    Family History  Problem Relation Age of Onset  . Heart failure Mother   . Stroke Father     Vitals:   07/01/18 0943  BP: 140/72  Pulse: 70  SpO2: 93%  Weight: 99.3 kg (219 lb)    Wt Readings from Last 3 Encounters:  07/01/18 99.3 kg (219 lb)  10/23/17 92.6 kg (204 lb 1.9 oz)  06/25/17 96.6 kg (213 lb)     PHYSICAL EXAM: General:  Well appearing. No resp difficulty HEENT: normal Neck: supple. no JVD. Carotids 2+ bilat; no bruits. No lymphadenopathy or thryomegaly appreciated. Cor: PMI nondisplaced. Regular rate & rhythm. 2/6 AS s2 crisp  Lungs: clear with markedly reduced breath sounds.  Abdomen: soft, nontender, nondistended. No hepatosplenomegaly. No bruits or masses. Good bowel sounds. Extremities: no cyanosis, clubbing, rash, + venous stasis changes trace edmea Neuro: alert & orientedx3, cranial nerves grossly intact. moves all 4 extremities w/o difficulty. Affect pleasant   ASSESSMENT & PLAN:   1. Chronic diastolic CHF with RV failure - Echo 02/02/16 LVEF 60-65%, Grade 3 DD. Mild AI, Mild MR, RV mildly dilated and severely reduced. RA mildly dilated. PA peak pressure 74 mm Hg.  Concerns for restrictive cardiomyopathy. - Echo 02/08/16 LVEF 55-60%, Restrictive physiology, (?)RV normal. No assessment of PA pressures. - Echo 7/19 EF 60-65% AVR ok  - Volume status looks good  - Continue torsemide 40 mg qod (decreased due to AKI). Recheck BMEt today - Continue spiro 25 mg daily.  - Continue  amlodipine 5 mg daily. - Continue lopressor 50 mg BID.  - Reinforced fluid restriction to < 2 L daily, sodium restriction to less than 2000 mg daily, and the importance of daily weights.   - Wear compression hose 2. Paroxysmal Afib s/p AF ablation/Maze s/p pacemaker - Followed by Dr. Rayann Heman 05/20/16. Remains in NSR on Tikosyn. - Regular on exam.   - Continue Eliquis 5 mg BID. No bleeding  3. AS s/p AVR - Stable bioprosthesis on echo 7/19. No change to current plan.   4. Pulmonary HTN - suspect pulmonary venous hypertension - RHC in 10/17 with pulmonary venous HTN primarily. No change.  5. Mild CAD by cath 01/2016 - Medical management. Continue atorvastatin 40 mg daily - Denies s/s ischemia.  6. Chronic respiratory failure with hypoxia - No longer on home O2.  - Follows up with pulmonary.   7. HTN - Blood pressure well controlled. Continue current regimen. 8. DM2 - Consider Jardiance if renal function permits   Glori Bickers, MD  07/01/18

## 2018-07-17 ENCOUNTER — Encounter: Payer: Self-pay | Admitting: Internal Medicine

## 2018-07-22 ENCOUNTER — Telehealth: Payer: Self-pay

## 2018-07-22 NOTE — Telephone Encounter (Signed)
Spoke with pt wife about evisit with [facility] 07-27-2018 she verified the email and phone number to contact him. Pt is aware to take Bp/pulse and have list of medications ready, verbalized understanding to download webex prior to visit. Instructed pt to call the office if she has any concerns or questions.

## 2018-07-24 ENCOUNTER — Other Ambulatory Visit (HOSPITAL_COMMUNITY): Payer: Self-pay | Admitting: Internal Medicine

## 2018-07-27 ENCOUNTER — Telehealth: Payer: Medicare Other | Admitting: Internal Medicine

## 2018-07-27 ENCOUNTER — Other Ambulatory Visit: Payer: Self-pay

## 2018-07-27 ENCOUNTER — Telehealth (INDEPENDENT_AMBULATORY_CARE_PROVIDER_SITE_OTHER): Payer: Medicare Other | Admitting: Internal Medicine

## 2018-07-27 ENCOUNTER — Encounter: Payer: Medicare Other | Admitting: Internal Medicine

## 2018-07-27 DIAGNOSIS — I4819 Other persistent atrial fibrillation: Secondary | ICD-10-CM

## 2018-07-27 DIAGNOSIS — I442 Atrioventricular block, complete: Secondary | ICD-10-CM

## 2018-07-27 NOTE — Progress Notes (Signed)
Electrophysiology TeleHealth Note   Due to national recommendations of social distancing due to COVID 19, an audio/video telehealth visit is felt to be most appropriate for this patient at this time.  See MyChart message from today for the patient's consent to telehealth for [facility]   Date:  07/27/2018   ID:  [name], DOB 11/08/1950, MRN [id number]  Location: patient's home  Provider location: [address], [place]  Evaluation Performed: Follow-up visit  PCP:  Velna Hatchet, MD   Cardiologist:    Dr Nahser/ Bensimhon Electrophysiologist:  Dr Rayann Heman  Chief Complaint:  afib  History of Present Illness:    [name] is a 67 y.o. male who presents via audio/video conferencing for a telehealth visit today.  Since last being seen in our clinic, the patient reports doing reasonably well. His afib is controlled with tikosyn.  CHF is stable.  He has been active in his yard with his animals.  He has also been fishing.  Fatigue and SOB are stable.  He has pain in his R leg, particularly in the calf. Today, he denies symptoms of palpitations, chest pain, lower extremity edema, dizziness, presyncope, or syncope.  The patient is otherwise without complaint today.  The patient denies symptoms of fevers, chills, cough, or new SOB worrisome for COVID 19.  Past Medical History:  Diagnosis Date  . Aortic stenosis, mild   . Arthritis of hand    "just a little bit in both hands" (03/31/2012)  . Asthma    "little bit" (03/31/2012)  . Atrial fibrillation [facility])    dx '04; DCCV '04, placed on flecainide, failed DCCV 04/2010, flecainide stopped; s/p successful A.fib ablation 01/31/12  . CHF (congestive heart failure) ([place])   . Diabetes mellitus without complication (HCC)    borderline  . DJD (degenerative joint disease)   . Exertional dyspnea   . Fibromyalgia   . First degree AV block    post ablation, ~443ms  . GERD (gastroesophageal reflux disease)   . Heart  murmur   . Hyperlipidemia   . Hypertension   . Hypothyroidism    S/P radiation  . Left atrial enlargement    LA size 6mm by echo 11/21/11  . Mitral regurgitation    trivial  . Obesity   . Obstructive sleep apnea    mild by sleep study 2013; pt stated he does not have a machine because "it wasn't bad enough for him to have one"  . Pacemaker 03/31/2012  . Restless leg syndrome   . Second degree AV block   . Visit for monitoring Tikosyn therapy 04/16/2016    Past Surgical History:  Procedure Laterality Date  . AORTIC VALVE REPLACEMENT N/A 08/22/2015   Procedure: AORTIC VALVE REPLACEMENT (AVR);  Surgeon: Ivin Poot, MD;  Location: [place];  Service: Open Heart Surgery;  Laterality: N/A;  . ATRIAL FIBRILLATION ABLATION  01/30/2012   PVI by Dr. Rayann Heman  . ATRIAL FIBRILLATION ABLATION N/A 01/31/2012   Procedure: ATRIAL FIBRILLATION ABLATION;  Surgeon: Thompson Grayer, MD;  Location: [facility] CATH LAB;  Service: Cardiovascular;  Laterality: N/A;  . BACK SURGERY     X 3  . CARDIAC CATHETERIZATION N/A 08/09/2015   Procedure: Right/Left Heart Cath and Coronary Angiography;  Surgeon: Peter M Martinique, MD;  Location: [place] CV LAB;  Service: Cardiovascular;  Laterality: N/A;  . CARDIAC CATHETERIZATION N/A 02/05/2016   Procedure: Right/Left Heart Cath and Coronary Angiography;  Surgeon: Jettie Booze,  MD;  Location: [place] CV LAB;  Service: Cardiovascular;  Laterality: N/A;  . CARDIAC CATHETERIZATION N/A 04/17/2016   Procedure: Right Heart Cath;  Surgeon: Jolaine Artist, MD;  Location: [place] CV LAB;  Service: Cardiovascular;  Laterality: N/A;  . CARDIOVASCULAR STRESS TEST  03/12/2002   EF 48%, NO EVIDENCE OF ISCHEMIA  . CARDIOVERSION  01/2012; 03/31/2012  . CARDIOVERSION N/A 02/25/2012   Procedure: CARDIOVERSION;  Surgeon: Thompson Grayer, MD;  Location: [facility] CATH LAB;  Service: Cardiovascular;  Laterality: N/A;  . CARDIOVERSION N/A 03/31/2012   Procedure: CARDIOVERSION;  Surgeon: Thompson Grayer, MD;  Location: [facility] CATH LAB;  Service: Cardiovascular;  Laterality: N/A;  . CARDIOVERSION N/A 11/23/2015   Procedure: CARDIOVERSION;  Surgeon: Larey Dresser, MD;  Location: [facility] ENDOSCOPY;  Service: Cardiovascular;  Laterality: N/A;  . CARDIOVERSION N/A 04/08/2016   Procedure: CARDIOVERSION;  Surgeon: Jolaine Artist, MD;  Location: [facility] ENDOSCOPY;  Service: Cardiovascular;  Laterality: N/A;  . CLIPPING OF ATRIAL APPENDAGE N/A 08/22/2015   Procedure: CLIPPING OF ATRIAL APPENDAGE;  Surgeon: Ivin Poot, MD;  Location: [place];  Service: Open Heart Surgery;  Laterality: N/A;  . DOPPLER ECHOCARDIOGRAPHY  03/11/2002   EF 70-75%  . FINGER SURGERY Left    Middle finger  . FINGER TENDON REPAIR  1980's   "right little finger" (03/31/2012)  . INSERT / REPLACE / REMOVE PACEMAKER     [employer]  . LEFT AND RIGHT HEART CATHETERIZATION WITH CORONARY ANGIOGRAM N/A 10/27/2012   Procedure: LEFT AND RIGHT HEART CATHETERIZATION WITH CORONARY ANGIOGRAM;  Surgeon: Laverda Page, MD;  Location: [facility] CATH LAB;  Service: Cardiovascular;  Laterality: N/A;  . LUMBAR DISC SURGERY  1980's X2;  2000's  . MAZE N/A 08/22/2015   Procedure: MAZE;  Surgeon: Ivin Poot, MD;  Location: [place];  Service: Open Heart Surgery;  Laterality: N/A;  . PACEMAKER INSERTION  03/31/2012   STJ Accent DR pacemaker implanted by Dr Rayann Heman  . PERMANENT PACEMAKER INSERTION N/A 03/31/2012   Procedure: PERMANENT PACEMAKER INSERTION;  Surgeon: Thompson Grayer, MD;  Location: [facility] CATH LAB;  Service: Cardiovascular;  Laterality: N/A;  . TEE WITHOUT CARDIOVERSION  01/30/2012   Procedure: TRANSESOPHAGEAL ECHOCARDIOGRAM (TEE);  Surgeon: Larey Dresser, MD;  Location: [place];  Service: Cardiovascular;  Laterality: N/A;  ablation next day  . TEE WITHOUT CARDIOVERSION N/A 08/22/2015   Procedure: TRANSESOPHAGEAL ECHOCARDIOGRAM (TEE);  Surgeon: Ivin Poot, MD;  Location: [place];  Service: Open Heart Surgery;  Laterality: N/A;  . US  ECHOCARDIOGRAPHY  08/07/2009   EF 55-60%    Current Outpatient Medications  Medication Sig Dispense Refill  . albuterol (PROVENTIL HFA;VENTOLIN HFA) 108 (90 Base) MCG/ACT inhaler Inhale 2 puffs into the lungs every 6 (six) hours as needed for wheezing or shortness of breath. 1 Inhaler 5  . amLODipine (NORVASC) 10 MG tablet Take 0.5 tablets (5 mg total) by mouth daily. 30 tablet 11  . apixaban (ELIQUIS) 5 MG TABS tablet Take 1 tablet (5 mg total) by mouth 2 (two) times daily. 180 tablet 3  . budesonide-formoterol (SYMBICORT) 160-4.5 MCG/ACT inhaler Inhale 2 puffs into the lungs 2 (two) times daily. 1 Inhaler 6  . Cholecalciferol (VITAMIN D) 2000 UNITS CAPS Take 2,000 Units by mouth daily.    [name] dofetilide (TIKOSYN) 250 MCG capsule Take 1 capsule (250 mcg total) by mouth 2 (two) times daily. Keep March appointment for further refills-JS 180 capsule 0  . fexofenadine (ALLEGRA) 180 MG tablet Take 1  tablet (180 mg total) by mouth daily. 30 tablet 1  . gabapentin (NEURONTIN) 300 MG capsule Take 600-1,200 mg by mouth See admin instructions. Take 600 mg by mouth in the morning, 600 mg at lunchtime, and 1,200 mg at bedtime  3  . guaiFENesin (MUCINEX) 600 MG 12 hr tablet Take 1,200 mg by mouth 2 (two) times daily.     [name] levothyroxine (SYNTHROID, LEVOTHROID) 175 MCG tablet Take 175 mcg by mouth daily before breakfast.    . MAGNESIUM PO Take 1 tablet by mouth at bedtime.    . metFORMIN (GLUCOPHAGE-XR) 500 MG 24 hr tablet Take 500 mg by mouth at bedtime.    . metoprolol tartrate (LOPRESSOR) 50 MG tablet TAKE 1 TABLET BY MOUTH 2  TIMES DAILY. NEEDS OFFICE  VISIT [phone] 180 tablet 3  . ondansetron (ZOFRAN ODT) 8 MG disintegrating tablet Take 1 tablet (8 mg total) by mouth every 8 (eight) hours as needed for nausea or vomiting. 10 tablet 0  . ondansetron (ZOFRAN) 4 MG tablet Take 1 tablet (4 mg total) by mouth every 8 (eight) hours as needed for nausea or vomiting. 10 tablet 0  . pantoprazole (PROTONIX) 40  MG tablet Take 40 mg by mouth daily.    . polyethylene glycol powder (GLYCOLAX/MIRALAX) powder Take 17 g by mouth daily. 500 g 0  . potassium chloride (K-DUR,KLOR-CON) 10 MEQ tablet TAKE 2 TABLETS BY MOUTH TWO TIMES DAILY (Patient taking differently: Take 20 mEq by mouth 2 (two) times daily. ) 360 tablet 2  . rOPINIRole (REQUIP) 1 MG tablet Take 1 tablet (1 mg total) by mouth daily. (Patient taking differently: Take 1 mg by mouth at bedtime. ) 90 tablet 2  . rosuvastatin (CRESTOR) 10 MG tablet Take 10 mg by mouth at bedtime.     [name] spironolactone (ALDACTONE) 25 MG tablet Take 1 tablet (25 mg total) by mouth daily. 90 tablet 3  . torsemide (DEMADEX) 20 MG tablet TAKE 2 TABLETS BY MOUTH  DAILY 180 tablet 1  . traMADol-acetaminophen (ULTRACET) 37.5-325 MG tablet Take 2 tablets by mouth 2 (two) times daily.    . traZODone (DESYREL) 50 MG tablet Take 12.5 mg by mouth at bedtime.      No current facility-administered medications for this visit.     Allergies:   Meloxicam and Vancomycin   Social History:  The patient  reports that he quit smoking about 27 years ago. His smoking use included cigarettes. He has a 7.50 pack-year smoking history. He has never used smokeless tobacco. He reports current alcohol use. He reports current drug use. Drug: Marijuana.   Family History:  The patient's family history includes Heart failure in his mother; Stroke in his father.   ROS:  Please see the history of present illness.   All other systems are personally reviewed and negative.    Exam:    Vital Signs:  BP 130/76   Pulse 70   Well appearing, alert and conversant, regular work of breathing,  good skin color Eyes- anicteric, neuro- grossly intact, skin- no apparent rash or lesions or cyanosis, mouth- oral mucosa is pink   Labs/Other Tests and Data Reviewed:    Recent Labs: 04/21/2018: B Natriuretic Peptide 225.5 05/08/2018: ALT 19; Hemoglobin 10.5; Platelets 135 07/01/2018: BUN 13; Creatinine, Ser 1.61;  Potassium 4.1; Sodium 135   Wt Readings from Last 3 Encounters:  07/01/18 219 lb (99.3 kg)  10/23/17 204 lb 1.9 oz (92.6 kg)  06/25/17 213 lb (96.6 kg)  Other studies personally reviewed: Additional studies/ records that were reviewed today include: Dr Gillermina Hu CHF note  Review of the above records today demonstrates: as above Echo 10/1017- EF 60% Prior radiographs: CT abdomen from 05/08/2018 is reviewed   Last device remote is reviewed from [place] PDF dated 05/18/18 which reveals normal device function, afib burden is 40%, he has been in persistent afib since 9/19   ASSESSMENT & PLAN:    1.  Complete heart block V paced 100% Normal pacemaker function on review of remotes  2. Persistent afib On eliquis S/p MAZE an LAA amputation On tikosyn Will not proceed with cardioversion in the setting of COVID 19.  We did discuss at length options of stopping tikosyn and remaining in afib, vs pursuit of cardioversion in 2 months after COVID 19.  He would like to follow-up in the AF clinic in 2 months for further discussions.  3. HTN Stable No change required today  4. COVID 19 screen The patient denies symptoms of COVID 19 at this time.  The importance of social distancing was discussed today.  Follow-up:  2 months with the AF clinic to consider cardioversion Next remote: 07/2018  Current medicines are reviewed at length with the patient today.   The patient does not have concerns regarding his medicines.  The following changes were made today:  none  Labs/ tests ordered today include:  No orders of the defined types were placed in this encounter.  Patient Risk:  after full review of this patients clinical status, I feel that they are at moderate risk at this time.  Today, I have spent 23 minutes with the patient with telehealth technology discussing afib management .    SignedThompson Grayer, MD  07/27/2018 2:17 PM     [facility] [address] [place]  [place] [address] [phone] (office) [phone] (fax)

## 2018-08-17 ENCOUNTER — Ambulatory Visit (INDEPENDENT_AMBULATORY_CARE_PROVIDER_SITE_OTHER): Payer: Medicare Other | Admitting: *Deleted

## 2018-08-17 ENCOUNTER — Other Ambulatory Visit: Payer: Self-pay

## 2018-08-17 DIAGNOSIS — I442 Atrioventricular block, complete: Secondary | ICD-10-CM

## 2018-08-17 LAB — CUP PACEART REMOTE DEVICE CHECK
Battery Remaining Longevity: 41 mo
Battery Remaining Percentage: 35 %
Battery Voltage: 2.83 V
Brady Statistic AP VP Percent: 57 %
Brady Statistic AP VS Percent: 1 %
Brady Statistic AS VP Percent: 43 %
Brady Statistic AS VS Percent: 1 %
Brady Statistic RA Percent Paced: 27 %
Brady Statistic RV Percent Paced: 99 %
Date Time Interrogation Session: 20200420075005
Implantable Lead Implant Date: 20131203
Implantable Lead Implant Date: 20131203
Implantable Lead Location: 753859
Implantable Lead Location: 753860
Implantable Lead Model: 1948
Implantable Pulse Generator Implant Date: 20131203
Lead Channel Impedance Value: 400 Ohm
Lead Channel Impedance Value: 630 Ohm
Lead Channel Pacing Threshold Amplitude: 0.75 V
Lead Channel Pacing Threshold Amplitude: 0.875 V
Lead Channel Pacing Threshold Pulse Width: 0.5 ms
Lead Channel Pacing Threshold Pulse Width: 0.8 ms
Lead Channel Sensing Intrinsic Amplitude: 3.7 mV
Lead Channel Sensing Intrinsic Amplitude: 8.8 mV
Lead Channel Setting Pacing Amplitude: 1 V
Lead Channel Setting Pacing Amplitude: 1.875
Lead Channel Setting Pacing Pulse Width: 0.8 ms
Lead Channel Setting Sensing Sensitivity: 4 mV
Pulse Gen Model: 2210
Pulse Gen Serial Number: 7408461

## 2018-08-24 ENCOUNTER — Encounter: Payer: Self-pay | Admitting: Cardiology

## 2018-08-24 NOTE — Progress Notes (Signed)
Remote pacemaker transmission.   

## 2018-09-01 ENCOUNTER — Other Ambulatory Visit (HOSPITAL_COMMUNITY): Payer: Self-pay | Admitting: Internal Medicine

## 2018-09-10 ENCOUNTER — Other Ambulatory Visit: Payer: Self-pay

## 2018-09-10 ENCOUNTER — Other Ambulatory Visit (HOSPITAL_COMMUNITY)
Admission: RE | Admit: 2018-09-10 | Discharge: 2018-09-10 | Disposition: A | Discharge status: Home / Self Care | Payer: Medicare Other | Source: Ambulatory Visit | Attending: Internal Medicine | Admitting: Internal Medicine

## 2018-09-10 ENCOUNTER — Ambulatory Visit (HOSPITAL_COMMUNITY)
Admission: RE | Admit: 2018-09-10 | Discharge: 2018-09-10 | Disposition: A | Discharge status: Home / Self Care | Payer: Medicare Other | Source: Ambulatory Visit | Attending: Nurse Practitioner | Admitting: Nurse Practitioner

## 2018-09-10 ENCOUNTER — Encounter (HOSPITAL_COMMUNITY): Payer: Self-pay | Admitting: Nurse Practitioner

## 2018-09-10 ENCOUNTER — Other Ambulatory Visit: Payer: Self-pay | Admitting: Internal Medicine

## 2018-09-10 VITALS — BP 152/84 | HR 70 | Ht 71.0 in | Wt 218.8 lb

## 2018-09-10 DIAGNOSIS — E118 Type 2 diabetes mellitus with unspecified complications: Secondary | ICD-10-CM | POA: Insufficient documentation

## 2018-09-10 DIAGNOSIS — I509 Heart failure, unspecified: Secondary | ICD-10-CM | POA: Insufficient documentation

## 2018-09-10 DIAGNOSIS — R011 Cardiac murmur, unspecified: Secondary | ICD-10-CM | POA: Insufficient documentation

## 2018-09-10 DIAGNOSIS — E039 Hypothyroidism, unspecified: Secondary | ICD-10-CM | POA: Insufficient documentation

## 2018-09-10 DIAGNOSIS — I110 Hypertensive heart disease with heart failure: Secondary | ICD-10-CM | POA: Insufficient documentation

## 2018-09-10 DIAGNOSIS — Z7984 Long term (current) use of oral hypoglycemic drugs: Secondary | ICD-10-CM | POA: Insufficient documentation

## 2018-09-10 DIAGNOSIS — M19049 Primary osteoarthritis, unspecified hand: Secondary | ICD-10-CM | POA: Insufficient documentation

## 2018-09-10 DIAGNOSIS — Z1159 Encounter for screening for other viral diseases: Secondary | ICD-10-CM | POA: Insufficient documentation

## 2018-09-10 DIAGNOSIS — Z87891 Personal history of nicotine dependence: Secondary | ICD-10-CM | POA: Diagnosis not present

## 2018-09-10 DIAGNOSIS — M797 Fibromyalgia: Secondary | ICD-10-CM | POA: Diagnosis not present

## 2018-09-10 DIAGNOSIS — E785 Hyperlipidemia, unspecified: Secondary | ICD-10-CM | POA: Insufficient documentation

## 2018-09-10 DIAGNOSIS — K219 Gastro-esophageal reflux disease without esophagitis: Secondary | ICD-10-CM | POA: Insufficient documentation

## 2018-09-10 DIAGNOSIS — Z7901 Long term (current) use of anticoagulants: Secondary | ICD-10-CM | POA: Insufficient documentation

## 2018-09-10 DIAGNOSIS — M1990 Unspecified osteoarthritis, unspecified site: Secondary | ICD-10-CM | POA: Insufficient documentation

## 2018-09-10 DIAGNOSIS — I4819 Other persistent atrial fibrillation: Secondary | ICD-10-CM | POA: Insufficient documentation

## 2018-09-10 DIAGNOSIS — I340 Nonrheumatic mitral (valve) insufficiency: Secondary | ICD-10-CM | POA: Diagnosis not present

## 2018-09-10 DIAGNOSIS — Z79899 Other long term (current) drug therapy: Secondary | ICD-10-CM | POA: Insufficient documentation

## 2018-09-10 LAB — BASIC METABOLIC PANEL
Anion gap: 9 (ref 5–15)
BUN: 17 mg/dL (ref 8–23)
CO2: 25 mmol/L (ref 22–32)
Calcium: 9.4 mg/dL (ref 8.9–10.3)
Chloride: 103 mmol/L (ref 98–111)
Creatinine, Ser: 1.49 mg/dL — ABNORMAL HIGH (ref 0.61–1.24)
GFR calc Af Amer: 55 mL/min — ABNORMAL LOW (ref >60)
GFR calc non Af Amer: 47 mL/min — ABNORMAL LOW (ref >60)
Glucose, Bld: 133 mg/dL — ABNORMAL HIGH (ref 70–99)
Potassium: 5 mmol/L (ref 3.5–5.1)
Sodium: 137 mmol/L (ref 135–145)

## 2018-09-10 LAB — CBC
HCT: 40.1 % (ref 39.0–52.0)
Hemoglobin: 12.1 g/dL — ABNORMAL LOW (ref 13.0–17.0)
MCH: 28.1 pg (ref 26.0–34.0)
MCHC: 30.2 g/dL (ref 30.0–36.0)
MCV: 93 fL (ref 80.0–100.0)
Platelets: 145 10*3/uL — ABNORMAL LOW (ref 150–400)
RBC: 4.31 MIL/uL (ref 4.22–5.81)
RDW: 15.6 % — ABNORMAL HIGH (ref 11.5–15.5)
WBC: 9 10*3/uL (ref 4.0–10.5)
nRBC: 0 % (ref 0.0–0.2)

## 2018-09-10 LAB — MAGNESIUM: Magnesium: 2.3 mg/dL (ref 1.7–2.4)

## 2018-09-10 NOTE — H&P (View-Only) (Signed)
Primary Care Physician: Velna Hatchet, MD Referring Physician: Dr. Erlene Quan is a 68 y.o. male with a h/o afib, CHF,DM, s/p Maze, ablation x 2 and  multiple cardioversion's and on tikosyn, in the afib clinic today for f/u of virtual appointment with Dr. Rayann Heman in March where he found the pt to be in persistent afib since 12/2017. He is 100% v paced. Merlin paceart report today confirms this. At the appointment with Dr. Rayann Heman, he discussed with pt to proceed to [place]  but with covid restrictions this was delayed. He feels  fatigued and short of breath. He is given the option of cardioversion today, now with restrictions relaxed, and he wishes to proceed. He has not missed any doses of anticoagulation x at least 3 weeks. His weight is stable compared to weight in March.   Today, he denies symptoms of palpitations, chest pain, shortness of breath, orthopnea, PND, lower extremity edema, dizziness, presyncope, syncope, or neurologic sequela. The patient is tolerating medications without difficulties and is otherwise without complaint today.   Past Medical History:  Diagnosis Date  . Aortic stenosis, mild   . Arthritis of hand    "just a little bit in both hands" (03/31/2012)  . Asthma    "little bit" (03/31/2012)  . Atrial fibrillation [facility])    dx '04; DCCV '04, placed on flecainide, failed DCCV 04/2010, flecainide stopped; s/p successful A.fib ablation 01/31/12  . CHF (congestive heart failure) ([place])   . Diabetes mellitus without complication (HCC)    borderline  . DJD (degenerative joint disease)   . Exertional dyspnea   . Fibromyalgia   . First degree AV block    post ablation, ~449ms  . GERD (gastroesophageal reflux disease)   . Heart murmur   . Hyperlipidemia   . Hypertension   . Hypothyroidism    S/P radiation  . Left atrial enlargement    LA size 25mm by echo 11/21/11  . Mitral regurgitation    trivial  . Obesity   . Obstructive sleep apnea    mild by sleep  study 2013; pt stated he does not have a machine because "it wasn't bad enough for him to have one"  . Pacemaker 03/31/2012  . Restless leg syndrome   . Second degree AV block   . Visit for monitoring Tikosyn therapy 04/16/2016   Past Surgical History:  Procedure Laterality Date  . AORTIC VALVE REPLACEMENT N/A 08/22/2015   Procedure: AORTIC VALVE REPLACEMENT (AVR);  Surgeon: Ivin Poot, MD;  Location: [place];  Service: Open Heart Surgery;  Laterality: N/A;  . ATRIAL FIBRILLATION ABLATION  01/30/2012   PVI by Dr. Rayann Heman  . ATRIAL FIBRILLATION ABLATION N/A 01/31/2012   Procedure: ATRIAL FIBRILLATION ABLATION;  Surgeon: Thompson Grayer, MD;  Location: [facility] CATH LAB;  Service: Cardiovascular;  Laterality: N/A;  . BACK SURGERY     X 3  . CARDIAC CATHETERIZATION N/A 08/09/2015   Procedure: Right/Left Heart Cath and Coronary Angiography;  Surgeon: Peter M Martinique, MD;  Location: [place] CV LAB;  Service: Cardiovascular;  Laterality: N/A;  . CARDIAC CATHETERIZATION N/A 02/05/2016   Procedure: Right/Left Heart Cath and Coronary Angiography;  Surgeon: Jettie Booze, MD;  Location: [place] CV LAB;  Service: Cardiovascular;  Laterality: N/A;  . CARDIAC CATHETERIZATION N/A 04/17/2016   Procedure: Right Heart Cath;  Surgeon: Jolaine Artist, MD;  Location: [place] CV LAB;  Service: Cardiovascular;  Laterality: N/A;  . CARDIOVASCULAR STRESS TEST  03/12/2002  EF 48%, NO EVIDENCE OF ISCHEMIA  . CARDIOVERSION  01/2012; 03/31/2012  . CARDIOVERSION N/A 02/25/2012   Procedure: CARDIOVERSION;  Surgeon: Thompson Grayer, MD;  Location: [facility] C[facility] LAB;  Service: Cardiovascular;  Laterality: N/A;  . CARDIOVERSION N/A 03/31/2012   Procedure: CARDIOVERSION;  Surgeon: Thompson Grayer, MD;  Location: [facility] CATH LAB;  Service: Cardiovascular;  Laterality: N/A;  . CARDIOVERSION N/A 11/23/2015   Procedure: CARDIOVERSION;  Surgeon: Larey Dresser, MD;  Location: [facility] ENDOSCOPY;  Service: Cardiovascular;  Laterality: N/A;   . CARDIOVERSION N/A 04/08/2016   Procedure: CARDIOVERSION;  Surgeon: Jolaine Artist, MD;  Location: [facility] ENDOSCOPY;  Service: Cardiovascular;  Laterality: N/A;  . CLIPPING OF ATRIAL APPENDAGE N/A 08/22/2015   Procedure: CLIPPING OF ATRIAL APPENDAGE;  Surgeon: Ivin Poot, MD;  Location: [place];  Service: Open Heart Surgery;  Laterality: N/A;  . DOPPLER ECHOCARDIOGRAPHY  03/11/2002   EF 70-75%  . FINGER SURGERY Left    Middle finger  . FINGER TENDON REPAIR  1980's   "right little finger" (03/31/2012)  . INSERT / REPLACE / REMOVE PACEMAKER     [employer]  . LEFT AND RIGHT HEART CATHETERIZATION WITH CORONARY ANGIOGRAM N/A 10/27/2012   Procedure: LEFT AND RIGHT HEART CATHETERIZATION WITH CORONARY ANGIOGRAM;  Surgeon: Laverda Page, MD;  Location: [facility] CATH LAB;  Service: Cardiovascular;  Laterality: N/A;  . LUMBAR DISC SURGERY  1980's X2;  2000's  . MAZE N/A 08/22/2015   Procedure: MAZE;  Surgeon: Ivin Poot, MD;  Location: [place];  Service: Open Heart Surgery;  Laterality: N/A;  . PACEMAKER INSERTION  03/31/2012   STJ Accent DR pacemaker implanted by Dr Rayann Heman  . PERMANENT PACEMAKER INSERTION N/A 03/31/2012   Procedure: PERMANENT PACEMAKER INSERTION;  Surgeon: Thompson Grayer, MD;  Location: [facility] CATH LAB;  Service: Cardiovascular;  Laterality: N/A;  . TEE WITHOUT CARDIOVERSION  01/30/2012   Procedure: TRANSESOPHAGEAL ECHOCARDIOGRAM (TEE);  Surgeon: Larey Dresser, MD;  Location: [place];  Service: Cardiovascular;  Laterality: N/A;  ablation next day  . TEE WITHOUT CARDIOVERSION N/A 08/22/2015   Procedure: TRANSESOPHAGEAL ECHOCARDIOGRAM (TEE);  Surgeon: Ivin Poot, MD;  Location: [place];  Service: Open Heart Surgery;  Laterality: N/A;  . US ECHOCARDIOGRAPHY  08/07/2009   EF 55-60%    Current Outpatient Medications  Medication Sig Dispense Refill  . albuterol (PROVENTIL HFA;VENTOLIN HFA) 108 (90 Base) MCG/ACT inhaler Inhale 2 puffs into the lungs every 6 (six) hours as needed for  wheezing or shortness of breath. 1 Inhaler 5  . amLODipine (NORVASC) 10 MG tablet Take 0.5 tablets (5 mg total) by mouth daily. 30 tablet 11  . apixaban (ELIQUIS) 5 MG TABS tablet Take 1 tablet (5 mg total) by mouth 2 (two) times daily. 180 tablet 3  . budesonide-formoterol (SYMBICORT) 160-4.5 MCG/ACT inhaler Inhale 2 puffs into the lungs 2 (two) times daily. 1 Inhaler 6  . Cholecalciferol (VITAMIN D) 2000 UNITS CAPS Take 2,000 Units by mouth daily.    [name] dofetilide (TIKOSYN) 250 MCG capsule Take 1 capsule (250 mcg total) by mouth 2 (two) times daily. Keep March appointment for further refills-JS 180 capsule 0  . fexofenadine (ALLEGRA) 180 MG tablet Take 1 tablet (180 mg total) by mouth daily. 30 tablet 1  . gabapentin (NEURONTIN) 300 MG capsule Take 600-1,200 mg by mouth See admin instructions. Take 600 mg by mouth in the morning, 600 mg at lunchtime, and 1,200 mg at bedtime  3  . guaiFENesin (MUCINEX) 600 MG 12 hr  tablet Take 1,200 mg by mouth 2 (two) times daily.     [name] levothyroxine (SYNTHROID, LEVOTHROID) 175 MCG tablet Take 175 mcg by mouth daily before breakfast.    . MAGNESIUM PO Take 1 tablet by mouth at bedtime.    . metFORMIN (GLUCOPHAGE-XR) 500 MG 24 hr tablet Take 500 mg by mouth at bedtime.    . metoprolol tartrate (LOPRESSOR) 50 MG tablet TAKE 1 TABLET BY MOUTH 2  TIMES DAILY. NEEDS OFFICE  VISIT [phone] 180 tablet 3  . ondansetron (ZOFRAN ODT) 8 MG disintegrating tablet Take 1 tablet (8 mg total) by mouth every 8 (eight) hours as needed for nausea or vomiting. 10 tablet 0  . ondansetron (ZOFRAN) 4 MG tablet Take 1 tablet (4 mg total) by mouth every 8 (eight) hours as needed for nausea or vomiting. 10 tablet 0  . pantoprazole (PROTONIX) 40 MG tablet Take 40 mg by mouth daily.    . polyethylene glycol powder (GLYCOLAX/MIRALAX) powder Take 17 g by mouth daily. 500 g 0  . potassium chloride (K-DUR) 20 MEQ tablet Take 1 tablet (20 mEq total) by mouth 2 (two) times daily. 180 tablet 3   . rOPINIRole (REQUIP) 1 MG tablet Take 1 tablet (1 mg total) by mouth daily. (Patient taking differently: Take 1 mg by mouth at bedtime. ) 90 tablet 2  . rosuvastatin (CRESTOR) 10 MG tablet Take 10 mg by mouth at bedtime.     [name] spironolactone (ALDACTONE) 25 MG tablet Take 1 tablet (25 mg total) by mouth daily. 90 tablet 3  . torsemide (DEMADEX) 20 MG tablet TAKE 2 TABLETS BY MOUTH  DAILY 180 tablet 1  . traMADol-acetaminophen (ULTRACET) 37.5-325 MG tablet Take 2 tablets by mouth 2 (two) times daily.    . traZODone (DESYREL) 50 MG tablet Take 12.5 mg by mouth at bedtime.      No current facility-administered medications for this encounter.     Allergies  Allergen Reactions  . Meloxicam Rash  . 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 syndrome 09/02/15, resolved with diphenhydramine and slowing of rate    Social History   Socioeconomic History  . Marital status: Married    Spouse name: Not on file  . Number of children: Not on file  . Years of education: Not on file  . Highest education level: Not on file  Occupational History  . Not on file  Social Needs  . Financial resource strain: Not on file  . Food insecurity:    Worry: Not on file    Inability: Not on file  . Transportation needs:    Medical: Not on file    Non-medical: Not on file  Tobacco Use  . Smoking status: Former Smoker    Packs/day: 0.50    Years: 15.00    Pack years: 7.50    Types: Cigarettes    Last attempt to quit: 07/18/1991    Years since quitting: 27.1  . Smokeless tobacco: Never Used  Substance and Sexual Activity  . Alcohol use: Yes    Comment: occasional  . Drug use: Yes    Types: Marijuana  . Sexual activity: Not Currently  Lifestyle  . Physical activity:    Days per week: Not on file    Minutes per session: Not on file  . Stress: Not on file  Relationships  . Social connections:    Talks on phone: Not on file    Gets together: Not on file    Attends religious  service: Not on file     Active member of club or organization: Not on file    Attends meetings of clubs or organizations: Not on file    Relationship status: Not on file  . Intimate partner violence:    Fear of current or ex partner: Not on file    Emotionally abused: Not on file    Physically abused: Not on file    Forced sexual activity: Not on file  Other Topics Concern  . Not on file  Social History Narrative   Lives in [place] with spouse.  Works as a Air traffic controller.    Family History  Problem Relation Age of Onset  . Heart failure Mother   . Stroke Father     ROS- All systems are reviewed and negative except as per the HPI above  Physical Exam: Vitals:   09/10/18 0832  BP: (!) 152/84  Pulse: 70  Weight: 99.2 kg  Height: 5\' 11"  (1.803 m)   Wt Readings from Last 3 Encounters:  09/10/18 99.2 kg  07/01/18 99.3 kg  10/23/17 92.6 kg    Labs: Lab Results  Component Value Date   NA 135 07/01/2018   K 4.1 07/01/2018   CL 100 07/01/2018   CO2 27 07/01/2018   GLUCOSE 185 (H) 07/01/2018   BUN 13 07/01/2018   CREATININE 1.61 (H) 07/01/2018   CALCIUM 9.2 07/01/2018   MG 2.5 (H) 09/26/2016   Lab Results  Component Value Date   INR 1.5 (H) 02/02/2016   Lab Results  Component Value Date   CHOL 114 (L) 08/03/2015   HDL 30 (L) 08/03/2015   LDLCALC 62 08/03/2015   TRIG 111 08/03/2015     GEN- The patient is well appearing, alert and oriented x 3 today.   Head- normocephalic, atraumatic Eyes-  Sclera clear, conjunctiva pink Ears- hearing intact Oropharynx- clear Neck- supple, no JVP Lymph- no cervical lymphadenopathy Lungs- Clear to ausculation bilaterally, normal work of breathing Heart- Regular rate and rhythm,(paced) no murmurs, rubs or gallops, PMI not laterally displaced GI- soft, NT, ND, + BS Extremities- no clubbing, cyanosis, or edema MS- no significant deformity or atrophy Skin- no rash or lesion Psych- euthymic mood, full affect Neuro- strength and sensation  are intact  EKG-v paced rhythm at 70 bpm, qrs int 186 ms, qtc 531 ms (paced)    Assessment and Plan: 1.  Persistent afib On eliquis, no missed doses  S/p MAZE and LAA amputation On tikosyn Now with covid-19 restrictions relaxed, with go ahead with cardioversion per Dr. Jackalyn Lombard suggestions in March 2020 virtual note.  Dr. Rayann Heman did also mention, that if he has ERAF, he may have to stop tikosyn and live in afib. Cbc/bmet/mag today  2. HTN elevated today but pt states that is is usually well controlled No change in approach    3. CHA2DS2VASc of at least 5 Continue eliquis 5mg  bid Stats no missed doses over the last 3 weeks  4. CHB 100 % paced Normal device function  F/u- he will have a remote device check 2 weeks after cardioversion and notified of results  Butch Penny C. Carroll, [facility] [address] [place], [place] [phone]

## 2018-09-10 NOTE — Progress Notes (Signed)
Primary Care Physician: Velna Hatchet, MD Referring Physician: Dr. Erlene Quan is a 68 y.o. male with a h/o afib, CHF,DM, s/p Maze, ablation x 2 and  multiple cardioversion's and on tikosyn, in the afib clinic today for f/u of virtual appointment with Dr. Rayann Heman in March where he found the pt to be in persistent afib since 12/2017. He is 100% v paced. Merlin paceart report today confirms this. At the appointment with Dr. Rayann Heman, he discussed with pt to proceed to [place]  but with covid restrictions this was delayed. He feels  fatigued and short of breath. He is given the option of cardioversion today, now with restrictions relaxed, and he wishes to proceed. He has not missed any doses of anticoagulation x at least 3 weeks. His weight is stable compared to weight in March.   Today, he denies symptoms of palpitations, chest pain, shortness of breath, orthopnea, PND, lower extremity edema, dizziness, presyncope, syncope, or neurologic sequela. The patient is tolerating medications without difficulties and is otherwise without complaint today.   Past Medical History:  Diagnosis Date  . Aortic stenosis, mild   . Arthritis of hand    "just a little bit in both hands" (03/31/2012)  . Asthma    "little bit" (03/31/2012)  . Atrial fibrillation [facility])    dx '04; DCCV '04, placed on flecainide, failed DCCV 04/2010, flecainide stopped; s/p successful A.fib ablation 01/31/12  . CHF (congestive heart failure) ([place])   . Diabetes mellitus without complication (HCC)    borderline  . DJD (degenerative joint disease)   . Exertional dyspnea   . Fibromyalgia   . First degree AV block    post ablation, ~436ms  . GERD (gastroesophageal reflux disease)   . Heart murmur   . Hyperlipidemia   . Hypertension   . Hypothyroidism    S/P radiation  . Left atrial enlargement    LA size 58mm by echo 11/21/11  . Mitral regurgitation    trivial  . Obesity   . Obstructive sleep apnea    mild by sleep  study 2013; pt stated he does not have a machine because "it wasn't bad enough for him to have one"  . Pacemaker 03/31/2012  . Restless leg syndrome   . Second degree AV block   . Visit for monitoring Tikosyn therapy 04/16/2016   Past Surgical History:  Procedure Laterality Date  . AORTIC VALVE REPLACEMENT N/A 08/22/2015   Procedure: AORTIC VALVE REPLACEMENT (AVR);  Surgeon: Ivin Poot, MD;  Location: [place];  Service: Open Heart Surgery;  Laterality: N/A;  . ATRIAL FIBRILLATION ABLATION  01/30/2012   PVI by Dr. Rayann Heman  . ATRIAL FIBRILLATION ABLATION N/A 01/31/2012   Procedure: ATRIAL FIBRILLATION ABLATION;  Surgeon: Thompson Grayer, MD;  Location: [facility] CATH LAB;  Service: Cardiovascular;  Laterality: N/A;  . BACK SURGERY     X 3  . CARDIAC CATHETERIZATION N/A 08/09/2015   Procedure: Right/Left Heart Cath and Coronary Angiography;  Surgeon: Peter M Martinique, MD;  Location: [place] CV LAB;  Service: Cardiovascular;  Laterality: N/A;  . CARDIAC CATHETERIZATION N/A 02/05/2016   Procedure: Right/Left Heart Cath and Coronary Angiography;  Surgeon: Jettie Booze, MD;  Location: [place] CV LAB;  Service: Cardiovascular;  Laterality: N/A;  . CARDIAC CATHETERIZATION N/A 04/17/2016   Procedure: Right Heart Cath;  Surgeon: Jolaine Artist, MD;  Location: [place] CV LAB;  Service: Cardiovascular;  Laterality: N/A;  . CARDIOVASCULAR STRESS TEST  03/12/2002  EF 48%, NO EVIDENCE OF ISCHEMIA  . CARDIOVERSION  01/2012; 03/31/2012  . CARDIOVERSION N/A 02/25/2012   Procedure: CARDIOVERSION;  Surgeon: Thompson Grayer, MD;  Location: [facility] CATH LAB;  Service: Cardiovascular;  Laterality: N/A;  . CARDIOVERSION N/A 03/31/2012   Procedure: CARDIOVERSION;  Surgeon: Thompson Grayer, MD;  Location: [facility] CATH LAB;  Service: Cardiovascular;  Laterality: N/A;  . CARDIOVERSION N/A 11/23/2015   Procedure: CARDIOVERSION;  Surgeon: Larey Dresser, MD;  Location: [facility] ENDOSCOPY;  Service: Cardiovascular;  Laterality: N/A;   . CARDIOVERSION N/A 04/08/2016   Procedure: CARDIOVERSION;  Surgeon: Jolaine Artist, MD;  Location: [facility] ENDOSCOPY;  Service: Cardiovascular;  Laterality: N/A;  . CLIPPING OF ATRIAL APPENDAGE N/A 08/22/2015   Procedure: CLIPPING OF ATRIAL APPENDAGE;  Surgeon: Ivin Poot, MD;  Location: [place];  Service: Open Heart Surgery;  Laterality: N/A;  . DOPPLER ECHOCARDIOGRAPHY  03/11/2002   EF 70-75%  . FINGER SURGERY Left    Middle finger  . FINGER TENDON REPAIR  1980's   "right little finger" (03/31/2012)  . INSERT / REPLACE / REMOVE PACEMAKER     [employer]  . LEFT AND RIGHT HEART CATHETERIZATION WITH CORONARY ANGIOGRAM N/A 10/27/2012   Procedure: LEFT AND RIGHT HEART CATHETERIZATION WITH CORONARY ANGIOGRAM;  Surgeon: Laverda Page, MD;  Location: [facility] CATH LAB;  Service: Cardiovascular;  Laterality: N/A;  . LUMBAR DISC SURGERY  1980's X2;  2000's  . MAZE N/A 08/22/2015   Procedure: MAZE;  Surgeon: Ivin Poot, MD;  Location: [place];  Service: Open Heart Surgery;  Laterality: N/A;  . PACEMAKER INSERTION  03/31/2012   STJ Accent DR pacemaker implanted by Dr Rayann Heman  . PERMANENT PACEMAKER INSERTION N/A 03/31/2012   Procedure: PERMANENT PACEMAKER INSERTION;  Surgeon: Thompson Grayer, MD;  Location: [facility] CATH LAB;  Service: Cardiovascular;  Laterality: N/A;  . TEE WITHOUT CARDIOVERSION  01/30/2012   Procedure: TRANSESOPHAGEAL ECHOCARDIOGRAM (TEE);  Surgeon: Larey Dresser, MD;  Location: [place];  Service: Cardiovascular;  Laterality: N/A;  ablation next day  . TEE WITHOUT CARDIOVERSION N/A 08/22/2015   Procedure: TRANSESOPHAGEAL ECHOCARDIOGRAM (TEE);  Surgeon: Ivin Poot, MD;  Location: [place];  Service: Open Heart Surgery;  Laterality: N/A;  . US ECHOCARDIOGRAPHY  08/07/2009   EF 55-60%    Current Outpatient Medications  Medication Sig Dispense Refill  . albuterol (PROVENTIL HFA;VENTOLIN HFA) 108 (90 Base) MCG/ACT inhaler Inhale 2 puffs into the lungs every 6 (six) hours as needed for  wheezing or shortness of breath. 1 Inhaler 5  . amLODipine (NORVASC) 10 MG tablet Take 0.5 tablets (5 mg total) by mouth daily. 30 tablet 11  . apixaban (ELIQUIS) 5 MG TABS tablet Take 1 tablet (5 mg total) by mouth 2 (two) times daily. 180 tablet 3  . budesonide-formoterol (SYMBICORT) 160-4.5 MCG/ACT inhaler Inhale 2 puffs into the lungs 2 (two) times daily. 1 Inhaler 6  . Cholecalciferol (VITAMIN D) 2000 UNITS CAPS Take 2,000 Units by mouth daily.    [name] dofetilide (TIKOSYN) 250 MCG capsule Take 1 capsule (250 mcg total) by mouth 2 (two) times daily. Keep March appointment for further refills-JS 180 capsule 0  . fexofenadine (ALLEGRA) 180 MG tablet Take 1 tablet (180 mg total) by mouth daily. 30 tablet 1  . gabapentin (NEURONTIN) 300 MG capsule Take 600-1,200 mg by mouth See admin instructions. Take 600 mg by mouth in the morning, 600 mg at lunchtime, and 1,200 mg at bedtime  3  . guaiFENesin (MUCINEX) 600 MG 12 hr  tablet Take 1,200 mg by mouth 2 (two) times daily.     [name] levothyroxine (SYNTHROID, LEVOTHROID) 175 MCG tablet Take 175 mcg by mouth daily before breakfast.    . MAGNESIUM PO Take 1 tablet by mouth at bedtime.    . metFORMIN (GLUCOPHAGE-XR) 500 MG 24 hr tablet Take 500 mg by mouth at bedtime.    . metoprolol tartrate (LOPRESSOR) 50 MG tablet TAKE 1 TABLET BY MOUTH 2  TIMES DAILY. NEEDS OFFICE  VISIT [phone] 180 tablet 3  . ondansetron (ZOFRAN ODT) 8 MG disintegrating tablet Take 1 tablet (8 mg total) by mouth every 8 (eight) hours as needed for nausea or vomiting. 10 tablet 0  . ondansetron (ZOFRAN) 4 MG tablet Take 1 tablet (4 mg total) by mouth every 8 (eight) hours as needed for nausea or vomiting. 10 tablet 0  . pantoprazole (PROTONIX) 40 MG tablet Take 40 mg by mouth daily.    . polyethylene glycol powder (GLYCOLAX/MIRALAX) powder Take 17 g by mouth daily. 500 g 0  . potassium chloride (K-DUR) 20 MEQ tablet Take 1 tablet (20 mEq total) by mouth 2 (two) times daily. 180 tablet 3   . rOPINIRole (REQUIP) 1 MG tablet Take 1 tablet (1 mg total) by mouth daily. (Patient taking differently: Take 1 mg by mouth at bedtime. ) 90 tablet 2  . rosuvastatin (CRESTOR) 10 MG tablet Take 10 mg by mouth at bedtime.     [name] spironolactone (ALDACTONE) 25 MG tablet Take 1 tablet (25 mg total) by mouth daily. 90 tablet 3  . torsemide (DEMADEX) 20 MG tablet TAKE 2 TABLETS BY MOUTH  DAILY 180 tablet 1  . traMADol-acetaminophen (ULTRACET) 37.5-325 MG tablet Take 2 tablets by mouth 2 (two) times daily.    . traZODone (DESYREL) 50 MG tablet Take 12.5 mg by mouth at bedtime.      No current facility-administered medications for this encounter.     Allergies  Allergen Reactions  . Meloxicam Rash  . 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 syndrome 09/02/15, resolved with diphenhydramine and slowing of rate    Social History   Socioeconomic History  . Marital status: Married    Spouse name: Not on file  . Number of children: Not on file  . Years of education: Not on file  . Highest education level: Not on file  Occupational History  . Not on file  Social Needs  . Financial resource strain: Not on file  . Food insecurity:    Worry: Not on file    Inability: Not on file  . Transportation needs:    Medical: Not on file    Non-medical: Not on file  Tobacco Use  . Smoking status: Former Smoker    Packs/day: 0.50    Years: 15.00    Pack years: 7.50    Types: Cigarettes    Last attempt to quit: 07/18/1991    Years since quitting: 27.1  . Smokeless tobacco: Never Used  Substance and Sexual Activity  . Alcohol use: Yes    Comment: occasional  . Drug use: Yes    Types: Marijuana  . Sexual activity: Not Currently  Lifestyle  . Physical activity:    Days per week: Not on file    Minutes per session: Not on file  . Stress: Not on file  Relationships  . Social connections:    Talks on phone: Not on file    Gets together: Not on file    Attends religious  service: Not on file     Active member of club or organization: Not on file    Attends meetings of clubs or organizations: Not on file    Relationship status: Not on file  . Intimate partner violence:    Fear of current or ex partner: Not on file    Emotionally abused: Not on file    Physically abused: Not on file    Forced sexual activity: Not on file  Other Topics Concern  . Not on file  Social History Narrative   Lives in [place] with spouse.  Works as a Air traffic controller.    Family History  Problem Relation Age of Onset  . Heart failure Mother   . Stroke Father     ROS- All systems are reviewed and negative except as per the HPI above  Physical Exam: Vitals:   09/10/18 0832  BP: (!) 152/84  Pulse: 70  Weight: 99.2 kg  Height: 5\' 11"  (1.803 m)   Wt Readings from Last 3 Encounters:  09/10/18 99.2 kg  07/01/18 99.3 kg  10/23/17 92.6 kg    Labs: Lab Results  Component Value Date   NA 135 07/01/2018   K 4.1 07/01/2018   CL 100 07/01/2018   CO2 27 07/01/2018   GLUCOSE 185 (H) 07/01/2018   BUN 13 07/01/2018   CREATININE 1.61 (H) 07/01/2018   CALCIUM 9.2 07/01/2018   MG 2.5 (H) 09/26/2016   Lab Results  Component Value Date   INR 1.5 (H) 02/02/2016   Lab Results  Component Value Date   CHOL 114 (L) 08/03/2015   HDL 30 (L) 08/03/2015   LDLCALC 62 08/03/2015   TRIG 111 08/03/2015     GEN- The patient is well appearing, alert and oriented x 3 today.   Head- normocephalic, atraumatic Eyes-  Sclera clear, conjunctiva pink Ears- hearing intact Oropharynx- clear Neck- supple, no JVP Lymph- no cervical lymphadenopathy Lungs- Clear to ausculation bilaterally, normal work of breathing Heart- Regular rate and rhythm,(paced) no murmurs, rubs or gallops, PMI not laterally displaced GI- soft, NT, ND, + BS Extremities- no clubbing, cyanosis, or edema MS- no significant deformity or atrophy Skin- no rash or lesion Psych- euthymic mood, full affect Neuro- strength and sensation  are intact  EKG-v paced rhythm at 70 bpm, qrs int 186 ms, qtc 531 ms (paced)    Assessment and Plan: 1.  Persistent afib On eliquis, no missed doses  S/p MAZE and LAA amputation On tikosyn Now with covid-19 restrictions relaxed, with go ahead with cardioversion per Dr. Jackalyn Lombard suggestions in March 2020 virtual note.  Dr. Rayann Heman did also mention, that if he has ERAF, he may have to stop tikosyn and live in afib. Cbc/bmet/mag today  2. HTN elevated today but pt states that is is usually well controlled No change in approach    3. CHA2DS2VASc of at least 5 Continue eliquis 5mg  bid Stats no missed doses over the last 3 weeks  4. CHB 100 % paced Normal device function  F/u- he will have a remote device check 2 weeks after cardioversion and notified of results  Butch Penny C. Carroll, [facility] [address] [employer] [place], [place] [address] [phone]

## 2018-09-10 NOTE — Patient Instructions (Signed)
Cardioversion scheduled for Monday, May 18th  - Arrive at the [facility] and go to admitting at [facility] not eat or drink anything after midnight the night prior to your procedure.  - Take all your morning medication with a sip of water prior to arrival.  - You will not be able to drive home after your procedure - your driver will need to remain in their car on hospital premises during your procedure.

## 2018-09-11 ENCOUNTER — Encounter (HOSPITAL_COMMUNITY): Payer: Self-pay

## 2018-09-11 LAB — NOVEL CORONAVIRUS, NAA (HOSP ORDER, SEND-OUT TO REF LAB; TAT 18-24 HRS): SARS-CoV-2, NAA: NOT DETECTED

## 2018-09-14 ENCOUNTER — Encounter (HOSPITAL_COMMUNITY): Payer: Self-pay | Admitting: *Deleted

## 2018-09-14 ENCOUNTER — Other Ambulatory Visit: Payer: Self-pay

## 2018-09-14 ENCOUNTER — Ambulatory Visit (HOSPITAL_COMMUNITY): Payer: Medicare Other | Admitting: Anesthesiology

## 2018-09-14 ENCOUNTER — Ambulatory Visit (HOSPITAL_COMMUNITY)
Admission: RE | Admit: 2018-09-14 | Discharge: 2018-09-14 | Disposition: A | Discharge status: Home / Self Care | Payer: Medicare Other | Attending: Cardiovascular Disease | Admitting: Cardiovascular Disease

## 2018-09-14 ENCOUNTER — Encounter (HOSPITAL_COMMUNITY)
Admission: RE | Disposition: A | Discharge status: Home / Self Care | Payer: Self-pay | Source: Home / Self Care | Attending: Cardiovascular Disease

## 2018-09-14 DIAGNOSIS — Z7984 Long term (current) use of oral hypoglycemic drugs: Secondary | ICD-10-CM | POA: Diagnosis not present

## 2018-09-14 DIAGNOSIS — J45909 Unspecified asthma, uncomplicated: Secondary | ICD-10-CM | POA: Diagnosis not present

## 2018-09-14 DIAGNOSIS — I4891 Unspecified atrial fibrillation: Secondary | ICD-10-CM

## 2018-09-14 DIAGNOSIS — I509 Heart failure, unspecified: Secondary | ICD-10-CM | POA: Insufficient documentation

## 2018-09-14 DIAGNOSIS — Z6830 Body mass index (BMI) 30.0-30.9, adult: Secondary | ICD-10-CM | POA: Diagnosis not present

## 2018-09-14 DIAGNOSIS — E119 Type 2 diabetes mellitus without complications: Secondary | ICD-10-CM | POA: Insufficient documentation

## 2018-09-14 DIAGNOSIS — E785 Hyperlipidemia, unspecified: Secondary | ICD-10-CM | POA: Diagnosis not present

## 2018-09-14 DIAGNOSIS — K219 Gastro-esophageal reflux disease without esophagitis: Secondary | ICD-10-CM | POA: Diagnosis not present

## 2018-09-14 DIAGNOSIS — M797 Fibromyalgia: Secondary | ICD-10-CM | POA: Diagnosis not present

## 2018-09-14 DIAGNOSIS — I4819 Other persistent atrial fibrillation: Secondary | ICD-10-CM | POA: Insufficient documentation

## 2018-09-14 DIAGNOSIS — G2581 Restless legs syndrome: Secondary | ICD-10-CM | POA: Diagnosis not present

## 2018-09-14 DIAGNOSIS — I110 Hypertensive heart disease with heart failure: Secondary | ICD-10-CM | POA: Insufficient documentation

## 2018-09-14 DIAGNOSIS — Z87891 Personal history of nicotine dependence: Secondary | ICD-10-CM | POA: Diagnosis not present

## 2018-09-14 DIAGNOSIS — Z7951 Long term (current) use of inhaled steroids: Secondary | ICD-10-CM | POA: Diagnosis not present

## 2018-09-14 DIAGNOSIS — E669 Obesity, unspecified: Secondary | ICD-10-CM | POA: Diagnosis not present

## 2018-09-14 DIAGNOSIS — I080 Rheumatic disorders of both mitral and aortic valves: Secondary | ICD-10-CM | POA: Diagnosis not present

## 2018-09-14 DIAGNOSIS — Z7901 Long term (current) use of anticoagulants: Secondary | ICD-10-CM | POA: Diagnosis not present

## 2018-09-14 DIAGNOSIS — G4733 Obstructive sleep apnea (adult) (pediatric): Secondary | ICD-10-CM | POA: Insufficient documentation

## 2018-09-14 DIAGNOSIS — E039 Hypothyroidism, unspecified: Secondary | ICD-10-CM | POA: Insufficient documentation

## 2018-09-14 DIAGNOSIS — Z950 Presence of cardiac pacemaker: Secondary | ICD-10-CM | POA: Insufficient documentation

## 2018-09-14 DIAGNOSIS — Z952 Presence of prosthetic heart valve: Secondary | ICD-10-CM | POA: Insufficient documentation

## 2018-09-14 DIAGNOSIS — Z79899 Other long term (current) drug therapy: Secondary | ICD-10-CM | POA: Insufficient documentation

## 2018-09-14 DIAGNOSIS — I483 Typical atrial flutter: Secondary | ICD-10-CM

## 2018-09-14 HISTORY — PX: CARDIOVERSION: SHX1299

## 2018-09-14 LAB — GLUCOSE, CAPILLARY: Glucose-Capillary: 125 mg/dL — ABNORMAL HIGH (ref 70–99)

## 2018-09-14 SURGERY — CARDIOVERSION
Anesthesia: General

## 2018-09-14 MED ORDER — PROPOFOL 10 MG/ML IV BOLUS
INTRAVENOUS | Status: DC | PRN
  Administered 2018-09-14: 100 mg via INTRAVENOUS

## 2018-09-14 MED ORDER — SODIUM CHLORIDE 0.9 % IV SOLN
INTRAVENOUS | Status: DC
  Administered 2018-09-14: 09:00:00 via INTRAVENOUS

## 2018-09-14 MED ORDER — LIDOCAINE 2% (20 MG/ML) 5 ML SYRINGE
INTRAMUSCULAR | Status: DC | PRN
  Administered 2018-09-14: 100 mL via INTRAVENOUS

## 2018-09-14 NOTE — Anesthesia Procedure Notes (Signed)
Procedure Name: MAC Date/Time: 09/14/2018 9:45 AM Performed by: Marsa Aris, CRNA Pre-anesthesia Checklist: Patient identified, Emergency Drugs available, Suction available, Patient being monitored and Timeout performed Patient Re-evaluated:Patient Re-evaluated prior to induction Oxygen Delivery Method: Ambu bag

## 2018-09-14 NOTE — Interval H&P Note (Signed)
History and Physical Interval Note:  09/14/2018 9:46 AM  [name]  has presented today for surgery, with the diagnosis of AFIB.  The various methods of treatment have been discussed with the patient and family. After consideration of risks, benefits and other options for treatment, the patient has consented to  Procedure(s): CARDIOVERSION (N/A) as a surgical intervention.  The patient's history has been reviewed, patient examined, no change in status, stable for surgery.  I have reviewed the patient's chart and labs.  Questions were answered to the patient's satisfaction.     Jenkins Rouge

## 2018-09-14 NOTE — Transfer of Care (Signed)
Immediate Anesthesia Transfer of Care Note  Patient: [name]  Procedure(s) Performed: CARDIOVERSION (N/A )  Patient Location: Endoscopy Unit  Anesthesia Type:MAC  Level of Consciousness: awake, alert  and oriented  Airway & Oxygen Therapy: Patient Spontanous Breathing and Patient connected to nasal cannula oxygen  Post-op Assessment: Report given to RN and Post -op Vital signs reviewed and stable  Post vital signs: Reviewed and stable  Last Vitals:  Vitals Value Taken Time  BP    Temp    Pulse    Resp    SpO2      Last Pain:  Vitals:   09/14/18 0908  TempSrc: Oral  PainSc: 0-No pain         Complications: No apparent anesthesia complications

## 2018-09-14 NOTE — Discharge Instructions (Addendum)
Electrical Cardioversion    Electrical cardioversion is the delivery of a jolt of electricity to restore a normal rhythm to the heart. A rhythm that is too fast or is not regular keeps the heart from pumping well. In this procedure, sticky patches or metal paddles are placed on the chest to deliver electricity to the heart from a device.  This procedure may be done in an emergency if:   There is low or no blood pressure as a result of the heart rhythm.   Normal rhythm must be restored as fast as possible to protect the brain and heart from further damage.   It may save a life.  This procedure may also be done for irregular or fast heart rhythms that are not immediately life-threatening.  Tell a health care provider about:   Any allergies you have.   All medicines you are taking, including vitamins, herbs, eye drops, creams, and over-the-counter medicines.   Any problems you or family members have had with anesthetic medicines.   Any blood disorders you have.   Any surgeries you have had.   Any medical conditions you have.   Whether you are pregnant or may be pregnant.  What are the risks?  Generally, this is a safe procedure. However, problems may occur, including:   Allergic reactions to medicines.   A blood clot that breaks free and travels to other parts of your body.   The possible return of an abnormal heart rhythm within hours or days after the procedure.   Your heart stopping (cardiac arrest). This is rare.  What happens before the procedure?  Medicines   Your health care provider may have you start taking:  ? Blood-thinning medicines (anticoagulants) so your blood does not clot as easily.  ? Medicines may be given to help stabilize your heart rate and rhythm.   Ask your health care provider about changing or stopping your regular medicines. This is especially important if you are taking diabetes medicines or blood thinners.  General instructions   Plan to have someone take you home from the  hospital or clinic.   If you will be going home right after the procedure, plan to have someone with you for 24 hours.   Follow instructions from your health care provider about eating or drinking restrictions.  What happens during the procedure?   To lower your risk of infection:  ? Your health care team will wash or sanitize their hands.  ? Your skin will be washed with soap.   An IV tube will be inserted into one of your veins.   You will be given a medicine to help you relax (sedative).   Sticky patches (electrodes) or metal paddles may be placed on your chest.   An electrical shock will be delivered.  The procedure may vary among health care providers and hospitals.  What happens after the procedure?     Your blood pressure, heart rate, breathing rate, and blood oxygen level will be monitored until the medicines you were given have worn off.   Do not drive for 24 hours if you were given a sedative.   Your heart rhythm will be watched to make sure it does not change.  This information is not intended to replace advice given to you by your health care provider. Make sure you discuss any questions you have with your health care provider.  Document Released: 04/05/2002 Document Revised: 12/13/2015 Document Reviewed: 10/20/2015  Elsevier Interactive Patient Education  2019   [employer] Inc[employer]

## 2018-09-14 NOTE — Anesthesia Preprocedure Evaluation (Signed)
Anesthesia Evaluation  Patient identified by MRN, date of birth, ID band Patient awake    Reviewed: Allergy & Precautions, H&P , Patient's Chart, lab work & pertinent test results, reviewed documented beta blocker date and time   Airway Mallampati: II  TM Distance: >3 FB Neck ROM: full    Dental  (+) Chipped, Poor Dentition, Dental Advisory Given   Pulmonary former smoker,    Pulmonary exam normal breath sounds clear to auscultation       Cardiovascular hypertension, Pt. on medications and Pt. on home beta blockers + dysrhythmias Atrial Fibrillation + pacemaker + Valvular Problems/Murmurs AS  Rhythm:Irregular Rate:Normal  Impressions:  - Normal LV systolic function; restrictive filling; moderate LVH;   moderate LAE; mild RAE; bioprosthetic aortic valve with normal   mean gradient of 11 mmHg; mild AI; moderate LAE; mild MR and TR   Neuro/Psych negative neurological ROS  negative psych ROS   GI/Hepatic Neg liver ROS, GERD  ,  Endo/Other  diabetes, Type 2  Renal/GU Renal InsufficiencyRenal disease     Musculoskeletal   Abdominal   Peds  Hematology   Anesthesia Other Findings   Reproductive/Obstetrics                             Anesthesia Physical  Anesthesia Plan  ASA: III  Anesthesia Plan: General   Post-op Pain Management:    Induction: Intravenous  PONV Risk Score and Plan:   Airway Management Planned: Mask  Additional Equipment:   Intra-op Plan:   Post-operative Plan:   Informed Consent: I have reviewed the patients History and Physical, chart, labs and discussed the procedure including the risks, benefits and alternatives for the proposed anesthesia with the patient or authorized representative who has indicated his/her understanding and acceptance.     Dental Advisory Given and Dental advisory given  Plan Discussed with: CRNA and Surgeon  Anesthesia Plan  Comments:         Anesthesia Quick Evaluation

## 2018-09-14 NOTE — Anesthesia Postprocedure Evaluation (Signed)
Anesthesia Post Note  Patient: [name]  Procedure(s) Performed: CARDIOVERSION (N/A )     Patient location during evaluation: PACU Anesthesia Type: General Level of consciousness: sedated Pain management: pain level controlled Vital Signs Assessment: post-procedure vital signs reviewed and stable Respiratory status: spontaneous breathing and respiratory function stable Cardiovascular status: stable Postop Assessment: no apparent nausea or vomiting Anesthetic complications: no    Last Vitals:  Vitals:   09/14/18 0958 09/14/18 0959  BP:  131/66  Pulse: 60   Resp: (!) 27   Temp:    SpO2: 99%     Last Pain:  Vitals:   09/14/18 0954  TempSrc: Oral  PainSc: 0-No pain                 SINGER,JAMES DANIEL

## 2018-09-14 NOTE — CV Procedure (Signed)
[place]: Anesthesia Propofol  DCC x 1 150 J converted from 4 to 1 flutter to NSR rate 70 P synch and AV pacing On Rx anticoagulation with no missed doses  No immediate neurologic sequelae  Jenkins Rouge

## 2018-09-16 ENCOUNTER — Encounter (HOSPITAL_COMMUNITY): Payer: Self-pay | Admitting: Cardiovascular Disease

## 2018-09-28 ENCOUNTER — Telehealth: Payer: Self-pay | Admitting: Cardiology

## 2018-09-28 ENCOUNTER — Ambulatory Visit (INDEPENDENT_AMBULATORY_CARE_PROVIDER_SITE_OTHER): Payer: Medicare Other | Admitting: *Deleted

## 2018-09-28 DIAGNOSIS — I442 Atrioventricular block, complete: Secondary | ICD-10-CM

## 2018-09-28 NOTE — Telephone Encounter (Signed)
They request a call back to let them know if pt is in NSR. Please call.

## 2018-09-28 NOTE — Telephone Encounter (Signed)
Spoke with pt wife and informed her that the remote transmission was not received. Instructed her how to send a manual transmission. Transmission is received.

## 2018-09-28 NOTE — Telephone Encounter (Signed)
Informed  transmission received and pt is in SR at this time.

## 2018-09-28 NOTE — Telephone Encounter (Signed)
New Message:   She wants to know if you received pt's transmission this morning?

## 2018-09-30 LAB — CUP PACEART REMOTE DEVICE CHECK
Date Time Interrogation Session: 20200603073502
Implantable Lead Implant Date: 20131203
Implantable Lead Implant Date: 20131203
Implantable Lead Location: 753859
Implantable Lead Location: 753860
Implantable Lead Model: 1948
Implantable Pulse Generator Implant Date: 20131203
Pulse Gen Model: 2210
Pulse Gen Serial Number: 7408461

## 2018-10-05 ENCOUNTER — Encounter: Payer: Self-pay | Admitting: Cardiology

## 2018-10-05 NOTE — Progress Notes (Signed)
Remote pacemaker transmission.   

## 2018-10-05 NOTE — Addendum Note (Signed)
Addended by: Tiajuana Amass on: 10/05/2018 10:25 AM   Modules accepted: Level of Service

## 2018-10-06 ENCOUNTER — Other Ambulatory Visit (HOSPITAL_COMMUNITY): Payer: Self-pay | Admitting: Internal Medicine

## 2018-11-16 ENCOUNTER — Ambulatory Visit (INDEPENDENT_AMBULATORY_CARE_PROVIDER_SITE_OTHER): Payer: Medicare Other | Admitting: *Deleted

## 2018-11-16 DIAGNOSIS — I442 Atrioventricular block, complete: Secondary | ICD-10-CM

## 2018-11-16 LAB — CUP PACEART REMOTE DEVICE CHECK
Date Time Interrogation Session: 20200720160621
Implantable Lead Implant Date: 20131203
Implantable Lead Implant Date: 20131203
Implantable Lead Location: 753859
Implantable Lead Location: 753860
Implantable Lead Model: 1948
Implantable Pulse Generator Implant Date: 20131203
Pulse Gen Model: 2210
Pulse Gen Serial Number: 7408461

## 2018-11-30 ENCOUNTER — Encounter: Payer: Self-pay | Admitting: Cardiology

## 2018-11-30 NOTE — Progress Notes (Signed)
Remote pacemaker transmission.   

## 2018-11-30 NOTE — Progress Notes (Signed)
letter

## 2018-12-20 ENCOUNTER — Other Ambulatory Visit (HOSPITAL_COMMUNITY): Payer: Self-pay | Admitting: Internal Medicine

## 2019-01-11 ENCOUNTER — Telehealth: Payer: Self-pay

## 2019-01-11 NOTE — Telephone Encounter (Signed)
   [facility] Pre-operative Risk Assessment    Request for surgical clearance:  1. What type of surgery is being performed? Tooth extractions (9 being pulled)    2. When is this surgery scheduled? TBD   3. What type of clearance is required (medical clearance vs. Pharmacy clearance to hold med vs. Both)? Pharmacy  4. Are there any medications that need to be held prior to surgery and how long? Eliquis    5. Practice name and name of physician performing surgery? [facility]/Dr. Feliberto Harts DDS   6. What is your office phone number [phone]    7.   What is your office fax number [phone]  8.   Anesthesia type (None, local, MAC, general) ? None listed   [name] 01/11/2019, 12:24 PM  _________________________________________________________________   (provider comments below)

## 2019-01-11 NOTE — Telephone Encounter (Signed)
   Primary Cardiologist: No primary care provider on file.  Chart reviewed as part of pre-operative protocol coverage. Patient was contacted 01/11/2019 in reference to pre-operative risk assessment for pending surgery as outlined below.  [name] was last seen on 08/2018 by Roderic Palau, NP for atrial fib, and has hx of COPD with chronic respiratory failure on home oxygen, pacemaker, aortic stenosis s/p AVR and Maze in 2014.  Since that day, [name] has done well since DCCV. [name]  Therefore, based on ACC/AHA guidelines, the patient would be at acceptable risk for the planned procedure without further cardiovascular testing.   I will route this recommendation to the requesting party via Epic fax function and remove from pre-op pool.  Please call with questions.  Cecilie Kicks, NP 01/11/2019, 2:01 PM

## 2019-01-11 NOTE — Telephone Encounter (Signed)
Please note pt does need antibiotics prior to dental procedure with AVR.  He may hold Eliquis 2 days prior to procedure and resume post op ASAP  Thank you.

## 2019-01-11 NOTE — Telephone Encounter (Signed)
Thanks. Agreed. Need antibiotic prophylaxis.

## 2019-01-11 NOTE — Telephone Encounter (Signed)
Patient with diagnosis of atrial fibrillation on Eliquis for anticoagulation.    Procedure: dental extractions (9 teeth) Date of procedure: TBD  CHADS2-VASc score of  5 (CHF, HTN, AGE, DM2, CAD)  CrCl 63.9 Platelet count 145  Per office protocol, patient can hold Eliquis for 2 days prior to procedure.

## 2019-01-22 ENCOUNTER — Other Ambulatory Visit: Payer: Self-pay | Admitting: Internal Medicine

## 2019-02-10 ENCOUNTER — Telehealth (HOSPITAL_COMMUNITY): Payer: Self-pay

## 2019-02-10 NOTE — Telephone Encounter (Signed)
Received message from patients wife inquiring about eliquis samples as patient is now in the donut hole.  Message forwarded to pharmacy Kathlee Nations and Ander Purpura) to assist.

## 2019-02-11 ENCOUNTER — Telehealth (HOSPITAL_COMMUNITY): Payer: Self-pay | Admitting: Pharmacy Technician

## 2019-02-11 NOTE — Telephone Encounter (Signed)
Received a message that patient needed help with his Eliquis co-pay. Called his pharmacy and his co-pay was $127.29, he is in the coverage gap.  Called to speak with him about trying to apply for patient assistance. Left voicemail.  Will follow up.  Charlann Boxer, CPhT

## 2019-02-12 ENCOUNTER — Other Ambulatory Visit (HOSPITAL_COMMUNITY): Payer: Self-pay | Admitting: *Deleted

## 2019-02-12 ENCOUNTER — Telehealth: Payer: Self-pay | Admitting: Internal Medicine

## 2019-02-12 MED ORDER — APIXABAN 5 MG PO TABS: ORAL_TABLET | ORAL | 3 refills | Status: DC

## 2019-02-12 NOTE — Telephone Encounter (Signed)
This is a CHF pt 

## 2019-02-12 NOTE — Telephone Encounter (Signed)
Patient calling the office for samples of medication:   1.  What medication and dosage are you requesting samples for? Eliquis  2.  Are you currently out of this medication? yes    

## 2019-02-15 ENCOUNTER — Ambulatory Visit (INDEPENDENT_AMBULATORY_CARE_PROVIDER_SITE_OTHER): Payer: Medicare Other | Admitting: *Deleted

## 2019-02-15 DIAGNOSIS — I5032 Chronic diastolic (congestive) heart failure: Secondary | ICD-10-CM

## 2019-02-15 DIAGNOSIS — I4819 Other persistent atrial fibrillation: Secondary | ICD-10-CM

## 2019-02-15 LAB — CUP PACEART REMOTE DEVICE CHECK
Battery Remaining Longevity: 25 mo
Battery Remaining Percentage: 22 %
Battery Voltage: 2.78 V
Brady Statistic AP VP Percent: 58 %
Brady Statistic AP VS Percent: 1 %
Brady Statistic AS VP Percent: 41 %
Brady Statistic AS VS Percent: 1 %
Brady Statistic RA Percent Paced: 34 %
Brady Statistic RV Percent Paced: 99 %
Date Time Interrogation Session: 20201019064248
Implantable Lead Implant Date: 20131203
Implantable Lead Implant Date: 20131203
Implantable Lead Location: 753859
Implantable Lead Location: 753860
Implantable Lead Model: 1948
Implantable Pulse Generator Implant Date: 20131203
Lead Channel Impedance Value: 410 Ohm
Lead Channel Impedance Value: 630 Ohm
Lead Channel Pacing Threshold Amplitude: 0.75 V
Lead Channel Pacing Threshold Amplitude: 0.875 V
Lead Channel Pacing Threshold Pulse Width: 0.5 ms
Lead Channel Pacing Threshold Pulse Width: 0.8 ms
Lead Channel Sensing Intrinsic Amplitude: 3.3 mV
Lead Channel Sensing Intrinsic Amplitude: 9 mV
Lead Channel Setting Pacing Amplitude: 1.125
Lead Channel Setting Pacing Amplitude: 1.75 V
Lead Channel Setting Pacing Pulse Width: 0.8 ms
Lead Channel Setting Sensing Sensitivity: 4 mV
Pulse Gen Model: 2210
Pulse Gen Serial Number: 7408461

## 2019-02-18 NOTE — Telephone Encounter (Signed)
Called to check in with patient since I had not heard back from him. Had to leave a message.  Charlann Boxer, CPhT

## 2019-02-22 ENCOUNTER — Telehealth (HOSPITAL_COMMUNITY): Payer: Self-pay | Admitting: Pharmacy Technician

## 2019-02-22 NOTE — Telephone Encounter (Signed)
Spoke to patient's wife, she is going to bring his OOP expense report and SS to sign with the application. I am taking 2 weeks worth of samples to the front desk for them in the meantime.  [id number]  Exp: 10/2020  Will follow up.  Charlann Boxer, CPhT

## 2019-02-25 ENCOUNTER — Other Ambulatory Visit (HOSPITAL_COMMUNITY): Payer: Self-pay | Admitting: *Deleted

## 2019-02-25 ENCOUNTER — Telehealth: Payer: Self-pay | Admitting: Internal Medicine

## 2019-02-25 MED ORDER — APIXABAN 5 MG PO TABS: ORAL_TABLET | ORAL | 3 refills | Status: DC

## 2019-02-25 NOTE — Telephone Encounter (Signed)
New message   Patient's wife wants to know if the patient can run a wood plainer or if this will effect his pacemaker?

## 2019-02-25 NOTE — Telephone Encounter (Signed)
Spoke with patient's wife. Advised that he can use equipment such as a wood planer as long as he holds it at least 12" from his PPM. If he ever feels poorly while using such equipment, discontinue use and contact the [facility]. Pt's wife verbalizes understanding and agreement with plan.

## 2019-02-25 NOTE — Telephone Encounter (Signed)
New message ° ° ° °Patient calling the office for samples of medication: ° ° °1.  What medication and dosage are you requesting samples for? apixaban (ELIQUIS) 5 MG TABS tablet ° °2.  Are you currently out of this medication? no ° ° °

## 2019-02-25 NOTE — Telephone Encounter (Signed)
This is a CHF pt 

## 2019-03-05 NOTE — Telephone Encounter (Signed)
Added patient to PAN wait list for Eliquis. Will call and check in with patient's wife about paperwork.  Charlann Boxer, CPhT

## 2019-03-05 NOTE — Progress Notes (Signed)
Remote pacemaker transmission.   

## 2019-03-09 NOTE — Telephone Encounter (Signed)
Patient Advocate Encounter  Completed application for [place] Patient Assistance Program sent in an effort to reduce the patient's out of pocket expense for Eliquis to $0.    Application completed and faxed to [phone].   [employer] patient assistance phone number for follow up is [phone].   This encounter will be updated until final determination.  Spoke to patient wife's, she is going to get me an OOP expense report for both her husband and her. I will send that in as soon as I receive it.  Charlann Boxer, CPhT

## 2019-03-15 NOTE — Telephone Encounter (Signed)
Spoke to [employer], the patient and his wife have to spend at least $922.61 in [place] on prescriptions in order for him to qualify.  Called and spoke to patient's wife who just had surgery. She is going to take a few days and get the paperwork over to me.   Will follow up.  Charlann Boxer, CPhT

## 2019-03-19 NOTE — Telephone Encounter (Signed)
Sent in OOP expense.  Will follow up.  Charlann Boxer, CPhT

## 2019-03-23 NOTE — Telephone Encounter (Signed)
Called [employer] to check the status of the application. Can call at the end of the week to see if the application has been processed.  Will follow up.  Charlann Boxer, CPhT

## 2019-03-30 NOTE — Telephone Encounter (Signed)
Called [employer], should have a determination by the end of the week. Will call the foundation back.  Elizabeth F Hines, CPhT  

## 2019-03-31 ENCOUNTER — Emergency Department (HOSPITAL_BASED_OUTPATIENT_CLINIC_OR_DEPARTMENT_OTHER)
Admission: EM | Admit: 2019-03-31 | Discharge: 2019-03-31 | Disposition: A | Discharge status: Home / Self Care | Payer: Medicare Other | Attending: Emergency Medicine | Admitting: Emergency Medicine

## 2019-03-31 ENCOUNTER — Encounter (HOSPITAL_BASED_OUTPATIENT_CLINIC_OR_DEPARTMENT_OTHER): Payer: Self-pay | Admitting: *Deleted

## 2019-03-31 ENCOUNTER — Emergency Department (HOSPITAL_BASED_OUTPATIENT_CLINIC_OR_DEPARTMENT_OTHER): Payer: Medicare Other

## 2019-03-31 ENCOUNTER — Other Ambulatory Visit: Payer: Self-pay

## 2019-03-31 DIAGNOSIS — I509 Heart failure, unspecified: Secondary | ICD-10-CM | POA: Diagnosis not present

## 2019-03-31 DIAGNOSIS — Z79899 Other long term (current) drug therapy: Secondary | ICD-10-CM | POA: Insufficient documentation

## 2019-03-31 DIAGNOSIS — E119 Type 2 diabetes mellitus without complications: Secondary | ICD-10-CM | POA: Insufficient documentation

## 2019-03-31 DIAGNOSIS — I110 Hypertensive heart disease with heart failure: Secondary | ICD-10-CM | POA: Insufficient documentation

## 2019-03-31 DIAGNOSIS — Z87891 Personal history of nicotine dependence: Secondary | ICD-10-CM | POA: Diagnosis not present

## 2019-03-31 DIAGNOSIS — R110 Nausea: Secondary | ICD-10-CM | POA: Diagnosis not present

## 2019-03-31 DIAGNOSIS — R0902 Hypoxemia: Secondary | ICD-10-CM | POA: Diagnosis not present

## 2019-03-31 DIAGNOSIS — R1084 Generalized abdominal pain: Secondary | ICD-10-CM | POA: Insufficient documentation

## 2019-03-31 DIAGNOSIS — M791 Myalgia, unspecified site: Secondary | ICD-10-CM | POA: Diagnosis present

## 2019-03-31 DIAGNOSIS — I4891 Unspecified atrial fibrillation: Secondary | ICD-10-CM | POA: Insufficient documentation

## 2019-03-31 LAB — CBC WITH DIFFERENTIAL/PLATELET
Abs Immature Granulocytes: 0.02 10*3/uL (ref 0.00–0.07)
Basophils Absolute: 0 10*3/uL (ref 0.0–0.1)
Basophils Relative: 0 %
Eosinophils Absolute: 0 10*3/uL (ref 0.0–0.5)
Eosinophils Relative: 0 %
HCT: 40.3 % (ref 39.0–52.0)
Hemoglobin: 13.2 g/dL (ref 13.0–17.0)
Immature Granulocytes: 0 %
Lymphocytes Relative: 8 %
Lymphs Abs: 0.5 10*3/uL — ABNORMAL LOW (ref 0.7–4.0)
MCH: 29.6 pg (ref 26.0–34.0)
MCHC: 32.8 g/dL (ref 30.0–36.0)
MCV: 90.4 fL (ref 80.0–100.0)
Monocytes Absolute: 0.7 10*3/uL (ref 0.1–1.0)
Monocytes Relative: 11 %
Neutro Abs: 5.4 10*3/uL (ref 1.7–7.7)
Neutrophils Relative %: 81 %
Platelets: 153 10*3/uL (ref 150–400)
RBC: 4.46 MIL/uL (ref 4.22–5.81)
RDW: 13.2 % (ref 11.5–15.5)
WBC: 6.6 10*3/uL (ref 4.0–10.5)
nRBC: 0 % (ref 0.0–0.2)

## 2019-03-31 LAB — COMPREHENSIVE METABOLIC PANEL
ALT: 18 U/L (ref 0–44)
AST: 22 U/L (ref 15–41)
Albumin: 4 g/dL (ref 3.5–5.0)
Alkaline Phosphatase: 64 U/L (ref 38–126)
Anion gap: 12 (ref 5–15)
BUN: 10 mg/dL (ref 8–23)
CO2: 21 mmol/L — ABNORMAL LOW (ref 22–32)
Calcium: 9.6 mg/dL (ref 8.9–10.3)
Chloride: 103 mmol/L (ref 98–111)
Creatinine, Ser: 1.28 mg/dL — ABNORMAL HIGH (ref 0.61–1.24)
GFR calc Af Amer: >60 mL/min (ref >60)
GFR calc non Af Amer: 57 mL/min — ABNORMAL LOW (ref >60)
Glucose, Bld: 131 mg/dL — ABNORMAL HIGH (ref 70–99)
Potassium: 3.4 mmol/L — ABNORMAL LOW (ref 3.5–5.1)
Sodium: 136 mmol/L (ref 135–145)
Total Bilirubin: 1.5 mg/dL — ABNORMAL HIGH (ref 0.3–1.2)
Total Protein: 7.4 g/dL (ref 6.5–8.1)

## 2019-03-31 LAB — LIPASE, BLOOD: Lipase: 23 U/L (ref 11–51)

## 2019-03-31 MED ORDER — ONDANSETRON 4 MG PO TBDP: 4.0000 mg | ORAL_TABLET | Freq: Three times a day (TID) | ORAL | 0 refills | Status: DC | PRN

## 2019-03-31 MED ORDER — SODIUM CHLORIDE 0.9 % IV BOLUS
500.0000 mL | Freq: Once | INTRAVENOUS | Status: AC
  Administered 2019-03-31: 500 mL via INTRAVENOUS

## 2019-03-31 MED ORDER — ONDANSETRON HCL 4 MG/2ML IJ SOLN
4.0000 mg | Freq: Once | INTRAMUSCULAR | Status: AC
  Administered 2019-03-31: 4 mg via INTRAVENOUS
  Filled 2019-03-31: qty 2

## 2019-03-31 MED ORDER — IOHEXOL 300 MG/ML  SOLN
100.0000 mL | Freq: Once | INTRAMUSCULAR | Status: AC
  Administered 2019-03-31: 100 mL via INTRAVENOUS

## 2019-03-31 MED ORDER — FENTANYL CITRATE (PF) 100 MCG/2ML IJ SOLN
100.0000 ug | Freq: Once | INTRAMUSCULAR | Status: AC
  Administered 2019-03-31: 100 ug via INTRAVENOUS
  Filled 2019-03-31: qty 2

## 2019-03-31 NOTE — Discharge Instructions (Signed)

## 2019-03-31 NOTE — ED Triage Notes (Signed)
Generalized pain for a long time and usually  Has this pain once or twice a year but it is getting worst.

## 2019-03-31 NOTE — ED Triage Notes (Signed)
Per EMS:  Pt sent from Urgent care/[facility]:  Abdominal pain with N/V x 2 days.  Unable to take po.  Pt did receive albuterol for "hyperventilating" and phenergan 25 IM and Toradol IM at 1715.  Temp 99.5

## 2019-03-31 NOTE — ED Notes (Signed)
ED Provider at bedside. 

## 2019-03-31 NOTE — ED Provider Notes (Signed)
Emergency Department Provider Note   I have reviewed the triage vital signs and the nursing notes.   HISTORY  Chief Complaint Generalized pain   HPI [name] is a 68 y.o. male with PMH reviewed below presents to the ED with total body pain and nausea.  Patient tells me that he has chronic pain in and intermittently has such severe discomfort that he becomes very nauseated.  He has hydrocodone at home but cannot take his medications.  He states he is eaten very little over the past 2 days.  He initially presented to urgent care where he was given Toradol, albuterol, promethazine with no improvement in symptoms.  He was transferred to the emergency department by EMS.  Patient denies any specific chest pain or shortness of breath.  He states he is having pain in his neck, back, feet, abdomen.    Past Medical History:  Diagnosis Date  . Aortic stenosis, mild   . Arthritis of hand    "just a little bit in both hands" (03/31/2012)  . Asthma    "little bit" (03/31/2012)  . Atrial fibrillation [facility])    dx '04; DCCV '04, placed on flecainide, failed DCCV 04/2010, flecainide stopped; s/p successful A.fib ablation 01/31/12  . CHF (congestive heart failure) ([place])   . Diabetes mellitus without complication (HCC)    borderline  . DJD (degenerative joint disease)   . Exertional dyspnea   . Fibromyalgia   . First degree AV block    post ablation, ~439ms  . GERD (gastroesophageal reflux disease)   . Heart murmur   . Hyperlipidemia   . Hypertension   . Hypothyroidism    S/P radiation  . Left atrial enlargement    LA size 31mm by echo 11/21/11  . Mitral regurgitation    trivial  . Obesity   . Obstructive sleep apnea    mild by sleep study 2013; pt stated he does not have a machine because "it wasn't bad enough for him to have one"  . Pacemaker 03/31/2012  . Restless leg syndrome   . Second degree AV block   . Visit for monitoring Tikosyn therapy 04/16/2016    Patient Active  Problem List   Diagnosis Date Noted  . Typical atrial flutter ([place])   . Chronic respiratory failure ([place]) 05/23/2016  . CAD (coronary artery disease) 05/23/2016  . Persistent atrial fibrillation ([place])   . Atypical atrial flutter ([place])   . Visit for monitoring Tikosyn therapy 04/16/2016  . Diastolic heart failure ([place]) 02/03/2016  . Cough 12/06/2015  . Hallucinations 09/04/2015  . Lymphadenopathy 09/04/2015  . Ascending aortic aneurysm ([place]) 09/04/2015  . Hypoxia 09/02/2015  . S/P aortic valve replacement with bioprosthetic valve 09/02/2015  . Pleural effusion 09/02/2015  . S/P AVR 08/22/2015  . GERD (gastroesophageal reflux disease) 09/20/2014  . 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 Dyspnea 09/15/2013  . Pulmonary hypertension ([place]) 09/15/2013  . Shortness of breath 05/05/2013  . 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 Bradycardia 03/31/2012  . Second degree AV block 03/31/2012  . AV block, 1st degree 02/01/2012  . PAF (paroxysmal atrial fibrillation) ([place]) 11/13/2011  . Fatigue 11/13/2011  . Hypertension 11/13/2011  . Aortic stenosis 11/13/2011  . Sleep apnea 11/13/2011    Past Surgical History:  Procedure Laterality Date  . AORTIC VALVE REPLACEMENT N/A 08/22/2015   Procedure: AORTIC VALVE REPLACEMENT (AVR);  Surgeon: Ivin Poot, MD;  Location: [place];  Service: Open Heart Surgery;  Laterality: N/A;  . ATRIAL FIBRILLATION ABLATION  01/30/2012   PVI by Dr. Rayann Heman  . ATRIAL FIBRILLATION ABLATION N/A 01/31/2012   Procedure: ATRIAL FIBRILLATION ABLATION;  Surgeon: Thompson Grayer, MD;  Location[facility] CATH LAB;  Service: Cardiovascular;  Laterality: N/A;  . BACK SURGERY     X 3  . CARDIAC CATHETERIZATION N/A 08/09/2015   Procedure: Right/Left Heart Cath and Coronary Angiography;  Surgeon: Peter M Martinique, MD;  Location: [place] CV LAB;  Service: Cardiovascular;  Laterality: N/A;  . CARDIAC CATHETERIZATION N/A 02/05/2016   Procedure: Right/Left Heart Cath and Coronary Angiography;  Surgeon:  Jettie Booze, MD;  Location: [place] CV LAB;  Service: Cardiovascular;  Laterality: N/A;  . CARDIAC CATHETERIZATION N/A 04/17/2016   Procedure: Right Heart Cath;  Surgeon: Jolaine Artist, MD;  Location: [place] CV LAB;  Service: Cardiovascular;  Laterality: N/A;  . CARDIOVASCULAR STRESS TEST  03/12/2002   EF 48%, NO EVIDENCE OF ISCHEMIA  . CARDIOVERSION  01/2012; 03/31/2012  . CARDIOVERSION N/A 02/25/2012   Procedure: CARDIOVERSION;  Surgeon: Thompson Grayer, MD;  Location[facility] LAB;  Service: Cardiovascular;  Laterality: N/A;  . CARDIOVERSION N/A 03/31/2012   Procedure: CARDIOVERSION;  Surgeon: Thompson Grayer, MD;  Location: [facility] CATH LAB;  Service: Cardiovascular;  Laterality: N/A;  . CARDIOVERSION N/A 11/23/2015   Procedure: CARDIOVERSION;  Surgeon: Larey Dresser, MD;  Location: [facility] ENDOSCOPY;  Service: Cardiovascular;  Laterality: N/A;  . CARDIOVERSION N/A 04/08/2016   Procedure: CARDIOVERSION;  Surgeon: Jolaine Artist, MD;  Location: [facility] ENDOSCOPY;  Service: Cardiovascular;  Laterality: N/A;  . CARDIOVERSION N/A 09/14/2018   Procedure: CARDIOVERSION;  Surgeon: Josue Hector, MD;  Location: [place];  Service: Cardiovascular;  Laterality: N/A;  . CLIPPING OF ATRIAL APPENDAGE N/A 08/22/2015   Procedure: CLIPPING OF ATRIAL APPENDAGE;  Surgeon: Ivin Poot, MD;  Location: [place];  Service: Open Heart Surgery;  Laterality: N/A;  . DOPPLER ECHOCARDIOGRAPHY  03/11/2002   EF 70-75%  . FINGER SURGERY Left    Middle finger  . FINGER TENDON REPAIR  1980's   "right little finger" (03/31/2012)  . INSERT / REPLACE / REMOVE PACEMAKER     St Jude  . LEFT AND RIGHT HEART CATHETERIZATION WITH CORONARY ANGIOGRAM N/A 10/27/2012   Procedure: LEFT AND RIGHT HEART CATHETERIZATION WITH CORONARY ANGIOGRAM;  Surgeon: Laverda Page, MD;  Location: [facility] CATH LAB;  Service: Cardiovascular;  Laterality: N/A;  . LUMBAR DISC SURGERY  1980's X2;  2000's  . MAZE N/A 08/22/2015   Procedure:  MAZE;  Surgeon: Ivin Poot, MD;  Location: [place];  Service: Open Heart Surgery;  Laterality: N/A;  . PACEMAKER INSERTION  03/31/2012   STJ Accent DR pacemaker implanted by Dr Rayann Heman  . PERMANENT PACEMAKER INSERTION N/A 03/31/2012   Procedure: PERMANENT PACEMAKER INSERTION;  Surgeon: Thompson Grayer, MD;  Location: [facility] CATH LAB;  Service: Cardiovascular;  Laterality: N/A;  . TEE WITHOUT CARDIOVERSION  01/30/2012   Procedure: TRANSESOPHAGEAL ECHOCARDIOGRAM (TEE);  Surgeon: Larey Dresser, MD;  Location: [place];  Service: Cardiovascular;  Laterality: N/A;  ablation next day  . TEE WITHOUT CARDIOVERSION N/A 08/22/2015   Procedure: TRANSESOPHAGEAL ECHOCARDIOGRAM (TEE);  Surgeon: Ivin Poot, MD;  Location: [place];  Service: Open Heart Surgery;  Laterality: N/A;  . US ECHOCARDIOGRAPHY  08/07/2009   EF 55-60%    Allergies Meloxicam and Vancomycin  Family History  Problem Relation Age of Onset  . Heart failure Mother   . Stroke Father     Social History Social History  Tobacco Use  . Smoking status: Former Smoker    Packs/day: 0.50    Years: 15.00    Pack years: 7.50    Types: Cigarettes    Quit date: 07/18/1991    Years since quitting: 27.7  . Smokeless tobacco: Never Used  Substance Use Topics  . Alcohol use: Yes    Comment: occasional  . Drug use: Yes    Types: Marijuana    Comment: 02/2019    Review of Systems  Constitutional: No fever/chills. Positive total body pain.  Eyes: No visual changes. ENT: No sore throat. Cardiovascular: Denies chest pain. Respiratory: Denies shortness of breath. Gastrointestinal: Positive abdominal pain. Positive nausea, no vomiting.  No diarrhea.  No constipation. Genitourinary: Negative for dysuria. Musculoskeletal: Positive  for back pain. Skin: Negative for rash. Neurological: Negative for headaches, focal weakness or numbness.  10-point ROS otherwise negative.  ____________________________________________   PHYSICAL EXAM:   VITAL SIGNS: ED Triage Vitals  Enc Vitals Group     BP 03/31/19 1811 (!) 148/75     Pulse Rate 03/31/19 1811 90     Resp 03/31/19 1811 (!) 24     Temp 03/31/19 1811 98.6 F (37 C)     Temp Source 03/31/19 1811 Oral     SpO2 03/31/19 1810 100 %     Weight 03/31/19 1811 201 lb (91.2 kg)     Height 03/31/19 1811 5\' 11"  (1.803 m)   Constitutional: Alert and oriented. Patient appears generally uncomfortable but able to provide history.  Eyes: Conjunctivae are normal.  Head: Atraumatic. Nose: No congestion/rhinnorhea. Mouth/Throat: Mucous membranes are moist.   Neck: No stridor.   Cardiovascular: Normal rate, regular rhythm. Good peripheral circulation. Grossly normal heart sounds.   Respiratory: Normal respiratory effort.  No retractions. Lungs CTAB. Gastrointestinal: Soft and nontender. No distention.  Musculoskeletal: No lower extremity tenderness nor edema. No gross deformities of extremities. Neurologic:  Normal speech and language. No gross focal neurologic deficits are appreciated.  Skin:  Skin is warm, dry and intact. No rash noted.   ____________________________________________   LABS (all labs ordered are listed, but only abnormal results are displayed)  Labs Reviewed  COMPREHENSIVE METABOLIC PANEL - Abnormal; Notable for the following components:      Result Value   Potassium 3.4 (*)    CO2 21 (*)    Glucose, Bld 131 (*)    Creatinine, Ser 1.28 (*)    Total Bilirubin 1.5 (*)    GFR calc non Af Amer 57 (*)    All other components within normal limits  CBC WITH DIFFERENTIAL/PLATELET - Abnormal; Notable for the following components:   Lymphs Abs 0.5 (*)    All other components within normal limits  LIPASE, BLOOD   ____________________________________________  EKG   EKG Interpretation  Date/Time:  Wednesday March 31 2019 18:41:39 EST Ventricular Rate:  89 PR Interval:    QRS Duration: 182 QT Interval:  441 QTC Calculation: 537 R Axis:   -74 Text  Interpretation: A-V dual-paced rhythm with some inhibition No further analysis attempted due to paced rhythm Confirmed by Nanda Quinton [phone]) on 03/31/2019 7:01:57 PM Also confirmed by Nanda Quinton [phone]), editor Hattie Perch ([id number])  on 04/01/2019 7:47:18 AM       ____________________________________________  RADIOLOGY  CXR and CT abdomen/pelvis reviewed.  ____________________________________________   PROCEDURES  Procedure(s) performed:   Procedures  None ____________________________________________   INITIAL IMPRESSION / ASSESSMENT AND PLAN / ED COURSE  Pertinent labs & imaging results that were available  during my care of the patient were reviewed by me and considered in my medical decision making (see chart for details).   Patient presents the emergency department with generalized pain.  He does have some tenderness in the left abdomen with nausea.  Plan for screening blood work, CT abdomen pelvis given his prior CT showing fat-containing hernia but low suspicion for this clinically.  CT and CXR reviewed. Intermittent hypoxemia here but reports this is baseline. Patient has home O2 but wife states does not use it frequently by choice but that he needs to. Encouraged him to do so at home. Labs unremarkable. Discussed need for close PCP follow up. Discussed ED return precautions.  ____________________________________________  FINAL CLINICAL IMPRESSION(S) / ED DIAGNOSES  Final diagnoses:  Generalized abdominal pain  Nausea  Hypoxemia     MEDICATIONS GIVEN DURING THIS VISIT:  Medications  sodium chloride 0.9 % bolus 500 mL (0 mLs Intravenous Stopped 03/31/19 2013)  fentaNYL (SUBLIMAZE) injection 100 mcg (100 mcg Intravenous Given 03/31/19 1844)  ondansetron (ZOFRAN) injection 4 mg (4 mg Intravenous Given 03/31/19 1844)  iohexol (OMNIPAQUE) 300 MG/ML solution 100 mL (100 mLs Intravenous Contrast Given 03/31/19 1946)     NEW OUTPATIENT MEDICATIONS STARTED DURING  THIS VISIT:  Discharge Medication List as of 03/31/2019  8:19 PM    START taking these medications   Details  ondansetron (ZOFRAN ODT) 4 MG disintegrating tablet Take 1 tablet (4 mg total) by mouth every 8 (eight) hours as needed for nausea., Starting Wed 03/31/2019, Print        Note:  This document was prepared using Dragon voice recognition software and may include unintentional dictation errors.  Nanda Quinton, MD, [facility] Emergency [facility]    Long, [facility], MD 04/03/19 1245

## 2019-04-02 NOTE — Telephone Encounter (Signed)
Advanced Heart Failure Patient Advocate Encounter   Patient was approved to receive Eliquis from [employer]  Patient ID: [id number] Effective dates: 12/4/202- through 04/29/2019  [employer] will call [phone]  Called and spoke with patient's wife. Gave her the phone number to [facility]. We are going to provide a week of samples to get him through until they deliver the medication.  Will also get re-enrollment application for next year started.  Charlann Boxer, CPhT   Charlann Boxer, CPhT

## 2019-04-07 ENCOUNTER — Other Ambulatory Visit: Payer: Self-pay | Admitting: Internal Medicine

## 2019-04-08 ENCOUNTER — Other Ambulatory Visit: Payer: Self-pay | Admitting: Internal Medicine

## 2019-04-08 DIAGNOSIS — N2889 Other specified disorders of kidney and ureter: Secondary | ICD-10-CM

## 2019-04-16 ENCOUNTER — Ambulatory Visit
Admission: RE | Admit: 2019-04-16 | Discharge: 2019-04-16 | Disposition: A | Discharge status: Home / Self Care | Payer: Medicare Other | Source: Ambulatory Visit | Attending: Internal Medicine | Admitting: Internal Medicine

## 2019-04-16 DIAGNOSIS — N2889 Other specified disorders of kidney and ureter: Secondary | ICD-10-CM

## 2019-04-16 MED ORDER — IOPAMIDOL (ISOVUE-300) INJECTION 61%
100.0000 mL | Freq: Once | INTRAVENOUS | Status: AC | PRN
  Administered 2019-04-16: 15:00:00 100 mL via INTRAVENOUS

## 2019-04-21 ENCOUNTER — Other Ambulatory Visit: Payer: Self-pay | Admitting: Urology

## 2019-04-21 DIAGNOSIS — N2889 Other specified disorders of kidney and ureter: Secondary | ICD-10-CM

## 2019-05-06 ENCOUNTER — Ambulatory Visit
Admission: RE | Admit: 2019-05-06 | Discharge: 2019-05-06 | Disposition: A | Discharge status: Home / Self Care | Payer: Medicare Other | Source: Ambulatory Visit | Attending: Urology | Admitting: Urology

## 2019-05-06 ENCOUNTER — Other Ambulatory Visit: Payer: Self-pay

## 2019-05-06 ENCOUNTER — Encounter: Payer: Self-pay | Admitting: *Deleted

## 2019-05-06 DIAGNOSIS — N2889 Other specified disorders of kidney and ureter: Secondary | ICD-10-CM

## 2019-05-06 HISTORY — PX: IR RADIOLOGIST EVAL & MGMT: IMG5224

## 2019-05-06 NOTE — Consult Note (Signed)
Chief Complaint: Patient was consulted remotely today (TeleHealth) for ablation of a left renal mass at the request of Winter,Christopher Marjory Lies.    Referring Physician(s): Winter,Christopher Marjory Lies  History of Present Illness: [name] is a 69 y.o. male with a left renal mass initially detected by CT on 05/08/2018 with description of a 12 mm low-attenuation lesion with possible enhancement. This was redemonstrated by CT on 03/31/2019. A multiphase renal mass protocol CT on 04/16/2019 described a 1.8 x 1.7 cm enhancing lesion in the interpolar left kidney. This could not be characterized by MRI as the patient has an indwelling pacemaker.  [name] is asymptomatic with respect to the renal mass. He does have a history of renal insufficiency and chronic kidney disease, stage III with a known significantly atrophic contralateral right kidney. He is not followed by a nephrologist. He has a history of aortic valve replacement, atrial fibrillation, pacemaker insertion and heart failure. He is followed by both Dr. Rayann Heman and Dr. Haroldine Laws.  [name] was referred to Dr. Lovena Neighbours who felt that he would not be a good candidate for surgical resection of the renal mass given his chronic kidney disease and cardiovascular risk factors.   Past Medical History:  Diagnosis Date  . Aortic stenosis, mild   . Arthritis of hand    "just a little bit in both hands" (03/31/2012)  . Asthma    "little bit" (03/31/2012)  . Atrial fibrillation [facility])    dx '04; DCCV '04, placed on flecainide, failed DCCV 04/2010, flecainide stopped; s/p successful A.fib ablation 01/31/12  . CHF (congestive heart failure) ([place])   . Diabetes mellitus without complication (HCC)    borderline  . DJD (degenerative joint disease)   . Exertional dyspnea   . Fibromyalgia   . First degree AV block    post ablation, ~411m  . GERD (gastroesophageal reflux disease)   . Heart murmur   . Hyperlipidemia   . Hypertension   .  Hypothyroidism    S/P radiation  . Left atrial enlargement    LA size 416mby echo 11/21/11  . Mitral regurgitation    trivial  . Obesity   . Obstructive sleep apnea    mild by sleep study 2013; pt stated he does not have a machine because "it wasn't bad enough for him to have one"  . Pacemaker 03/31/2012  . Restless leg syndrome   . Second degree AV block   . Visit for monitoring Tikosyn therapy 04/16/2016    Past Surgical History:  Procedure Laterality Date  . AORTIC VALVE REPLACEMENT N/A 08/22/2015   Procedure: AORTIC VALVE REPLACEMENT (AVR);  Surgeon: PeIvin PootMD;  [facility] Service: Open Heart Surgery;  Laterality: N/A;  . ATRIAL FIBRILLATION ABLATION  01/30/2012   PVI by Dr. AlRayann Heman. ATRIAL FIBRILLATION ABLATION N/A 01/31/2012   Procedure: ATRIAL FIBRILLATION ABLATION;  Surgeon: JaThompson GrayerMD;  [facility];  Service: Cardiovascular;  Laterality: N/A;  . BACK SURGERY     X 3  . CARDIAC CATHETERIZATION N/A 08/09/2015   Procedure: Right/Left Heart Cath and Coronary Angiography;  Surgeon: Peter M JoMartiniqueMD;  [facility];  Service: Cardiovascular;  Laterality: N/A;  . CARDIAC CATHETERIZATION N/A 02/05/2016   Procedure: Right/Left Heart Cath and Coronary Angiography;  Surgeon: JaJettie BoozeMD;  [facility];  Service: Cardiovascular;  Laterality: N/A;  . CARDIAC CATHETERIZATION N/A 04/17/2016   Procedure: Right Heart Cath;  Surgeon: Jolaine Artist, MD;  Location: [place] CV LAB;  Service: Cardiovascular;  Laterality: N/A;  . CARDIOVASCULAR STRESS TEST  03/12/2002   EF 48%, NO EVIDENCE OF ISCHEMIA  . CARDIOVERSION  01/2012; 03/31/2012  . CARDIOVERSION N/A 02/25/2012   Procedure: CARDIOVERSION;  Surgeon: Thompson Grayer, MD;  Location[facility] CATH LAB;  Service: Cardiovascular;  Laterality: N/A;  . CARDIOVERSION N/A 03/31/2012   Procedure: CARDIOVERSION;  Surgeon: Thompson Grayer, MD;  Location: [facility] C[facility] LAB;  Service:  Cardiovascular;  Laterality: N/A;  . CARDIOVERSION N/A 11/23/2015   Procedure: CARDIOVERSION;  Surgeon: Larey Dresser, MD;  Location: [facility] ENDOSCOPY;  Service: Cardiovascular;  Laterality: N/A;  . CARDIOVERSION N/A 04/08/2016   Procedure: CARDIOVERSION;  Surgeon: Jolaine Artist, MD;  Location: [facility] ENDOSCOPY;  Service: Cardiovascular;  Laterality: N/A;  . CARDIOVERSION N/A 09/14/2018   Procedure: CARDIOVERSION;  Surgeon: Josue Hector, MD;  Location: [place];  Service: Cardiovascular;  Laterality: N/A;  . CLIPPING OF ATRIAL APPENDAGE N/A 08/22/2015   Procedure: CLIPPING OF ATRIAL APPENDAGE;  Surgeon: Ivin Poot, MD;  Location: [place];  Service: Open Heart Surgery;  Laterality: N/A;  . DOPPLER ECHOCARDIOGRAPHY  03/11/2002   EF 70-75%  . FINGER SURGERY Left    Middle finger  . FINGER TENDON REPAIR  1980's   "right little finger" (03/31/2012)  . INSERT / REPLACE / REMOVE PACEMAKER     [employer]  . IR RADIOLOGIST EVAL & MGMT  05/06/2019  . LEFT AND RIGHT HEART CATHETERIZATION WITH CORONARY ANGIOGRAM N/A 10/27/2012   Procedure: LEFT AND RIGHT HEART CATHETERIZATION WITH CORONARY ANGIOGRAM;  Surgeon: Laverda Page, MD;  Location: [facility] CATH LAB;  Service: Cardiovascular;  Laterality: N/A;  . LUMBAR DISC SURGERY  1980's X2;  2000's  . MAZE N/A 08/22/2015   Procedure: MAZE;  Surgeon: Ivin Poot, MD;  Location: [place];  Service: Open Heart Surgery;  Laterality: N/A;  . PACEMAKER INSERTION  03/31/2012   STJ Accent DR pacemaker implanted by Dr Rayann Heman  . PERMANENT PACEMAKER INSERTION N/A 03/31/2012   Procedure: PERMANENT PACEMAKER INSERTION;  Surgeon: Thompson Grayer, MD;  Location: [facility] CATH LAB;  Service: Cardiovascular;  Laterality: N/A;  . TEE WITHOUT CARDIOVERSION  01/30/2012   Procedure: TRANSESOPHAGEAL ECHOCARDIOGRAM (TEE);  Surgeon: Larey Dresser, MD;  Location: [place];  Service: Cardiovascular;  Laterality: N/A;  ablation next day  . TEE WITHOUT CARDIOVERSION N/A 08/22/2015    Procedure: TRANSESOPHAGEAL ECHOCARDIOGRAM (TEE);  Surgeon: Ivin Poot, MD;  Location: [place];  Service: Open Heart Surgery;  Laterality: N/A;  . US ECHOCARDIOGRAPHY  08/07/2009   EF 55-60%    Allergies: Meloxicam and Vancomycin  Medications: Prior to Admission medications   Medication Sig Start Date End Date Taking? Authorizing Provider  albuterol (PROVENTIL HFA;VENTOLIN HFA) 108 (90 Base) MCG/ACT inhaler Inhale 2 puffs into the lungs every 6 (six) hours as needed for wheezing or shortness of breath. 12/28/15   Collene Gobble, MD  amLODipine (NORVASC) 10 MG tablet Take 0.5 tablets (5 mg total) by mouth daily. 09/18/15   Nahser, Wonda Cheng, MD  apixaban (ELIQUIS) 5 MG TABS tablet TAKE 1 TABLET(5 MG) BY MOUTH TWICE DAILY 02/25/19   Bensimhon, Shaune Pascal, MD  budesonide-formoterol [facility]) 160-4.5 MCG/ACT inhaler Inhale 2 puffs into the lungs 2 (two) times daily. 11/13/15   Collene Gobble, MD  Cholecalciferol (VITAMIN D) 2000 UNITS CAPS Take 8,000 Units by mouth at bedtime.     [provider]  dofetilide Phyllis Ginger)  250 MCG capsule TAKE ONE CAPSULE BY MOUTH TWICE A DAY 01/22/19   Allred, Jeneen Rinks, MD  fexofenadine (ALLEGRA) 180 MG tablet Take 1 tablet (180 mg total) by mouth daily. 03/01/14   Collene Gobble, MD  gabapentin (NEURONTIN) 300 MG capsule Take 600-1,200 mg by mouth See admin instructions. Take 600 mg by mouth in the morning, 600 mg at lunchtime, and 1,200 mg at bedtime 09/13/14   [provider]  guaiFENesin (MUCINEX) 600 MG 12 hr tablet Take 1,200 mg by mouth 2 (two) times daily.     [provider]  HYDROcodone-acetaminophen (NORCO/VICODIN) 5-325 MG tablet Take 1 tablet by mouth at bedtime as needed for moderate pain.    [provider]  levothyroxine (SYNTHROID, LEVOTHROID) 175 MCG tablet Take 175 mcg by mouth daily before breakfast.    [provider]  MAGNESIUM PO Take 1 tablet by mouth at bedtime.    [provider]  metFORMIN  (GLUCOPHAGE-XR) 500 MG 24 hr tablet Take 500 mg by mouth at bedtime.    [provider]  metoprolol tartrate (LOPRESSOR) 50 MG tablet TAKE 1 TABLET BY MOUTH 2  TIMES DAILY. NEEDS OFFICE  VISIT [phone] Patient taking differently: Take 50 mg by mouth 2 (two) times daily.  07/24/18   Bensimhon, Shaune Pascal, MD  ondansetron (ZOFRAN ODT) 4 MG disintegrating tablet Take 1 tablet (4 mg total) by mouth every 8 (eight) hours as needed for nausea. 03/31/19   Long, Wonda Olds, MD  ondansetron (ZOFRAN) 4 MG tablet Take 1 tablet (4 mg total) by mouth every 8 (eight) hours as needed for nausea or vomiting. 05/08/18   Shirley, [place], DO  pantoprazole (PROTONIX) 40 MG tablet Take 40 mg by mouth daily.    [provider]  polyethylene glycol powder (GLYCOLAX/MIRALAX) powder Take 17 g by mouth daily. Patient taking differently: Take 17 g by mouth daily as needed for mild constipation.  05/08/18   Shirley, [place], DO  potassium chloride (K-DUR) 20 MEQ tablet Take 1 tablet (20 mEq total) by mouth 2 (two) times daily. Patient taking differently: Take 20-40 mEq by mouth See admin instructions. Take 40 meq twice daily on days taking Torsemide, on the other days take 20 meq twice daily 09/03/18   Bensimhon, Shaune Pascal, MD  rOPINIRole (REQUIP) 1 MG tablet Take 1 tablet (1 mg total) by mouth daily. Patient taking differently: Take 1 mg by mouth at bedtime.  12/24/13   Collene Gobble, MD  rosuvastatin (CRESTOR) 10 MG tablet Take 10 mg by mouth at bedtime.  08/08/16   [provider]  spironolactone (ALDACTONE) 25 MG tablet Take 1 tablet (25 mg total) by mouth daily. 02/16/16   Shirley Friar, PA-C  torsemide (DEMADEX) 20 MG tablet TAKE 2 TABLETS BY MOUTH  DAILY 12/21/18   Bensimhon, Shaune Pascal, MD  traMADol-acetaminophen (ULTRACET) 37.5-325 MG tablet Take 2 tablets by mouth 2 (two) times daily.    [provider]  traZODone (DESYREL) 50 MG tablet Take 12.5 mg by mouth at bedtime.     [provider]     Family History  Problem Relation Age of Onset  . Heart failure Mother   . Stroke Father     Social History   Socioeconomic History  . Marital status: Married    Spouse name: Not on file  . Number of children: Not on file  . Years of education: Not on file  . Highest education level: Not on file  Occupational History  .  Not on file  Tobacco Use  . Smoking status: Former Smoker    Packs/day: 0.50    Years: 15.00    Pack years: 7.50    Types: Cigarettes    Quit date: 07/18/1991    Years since quitting: 27.8  . Smokeless tobacco: Never Used  Substance and Sexual Activity  . Alcohol use: Yes    Comment: occasional  . Drug use: Yes    Types: Marijuana    Comment: 02/2019  . Sexual activity: Not Currently  Other Topics Concern  . Not on file  Social History Narrative   Lives in [place] with spouse.  Works as a Air traffic controller.   Social Determinants of Health   Financial Resource Strain:   . Difficulty of Paying Living Expenses: Not on file  Food Insecurity:   . Worried About Charity fundraiser in the Last Year: Not on file  . Ran Out of Food in the Last Year: Not on file  Transportation Needs:   . Lack of Transportation (Medical): Not on file  . Lack of Transportation (Non-Medical): Not on file  Physical Activity:   . Days of Exercise per Week: Not on file  . Minutes of Exercise per Session: Not on file  Stress:   . Feeling of Stress : Not on file  Social Connections:   . Frequency of Communication with Friends and Family: Not on file  . Frequency of Social Gatherings with Friends and Family: Not on file  . Attends Religious Services: Not on file  . Active Member of Clubs or Organizations: Not on file  . Attends Archivist Meetings: Not on file  . Marital Status: Not on file    ECOG Status: 0 - Asymptomatic  Review of Systems  Constitutional: Negative.   Respiratory: Negative.   Cardiovascular: Negative.     Gastrointestinal: Negative.   Genitourinary: Negative.   Musculoskeletal: Negative.   Neurological: Negative.   Psychiatric/Behavioral: Dysphoric mood:      Review of Systems: A 12 point ROS discussed and pertinent positives are indicated in the HPI above.  All other systems are negative.  Physical Exam No direct physical exam was performed (except for noted visual exam findings with Video Visits).   Vital Signs: There were no vitals taken for this visit.  Imaging: CT ABDOMEN W WO CONTRAST  Result Date: 04/16/2019 CLINICAL DATA:  Abdominal pain. Indeterminate left renal lesion on recent CT. EXAM: CT ABDOMEN WITHOUT AND WITH CONTRAST TECHNIQUE: Multidetector CT imaging of the abdomen was performed following the standard protocol before and following the bolus administration of intravenous contrast. CONTRAST:  146m ISOVUE-300 IOPAMIDOL (ISOVUE-300) INJECTION 61% COMPARISON:  03/31/2019 FINDINGS: Lower chest: No acute findings. Hepatobiliary: No hepatic masses identified. A few tiny calcified gallstones are seen, however there is no evidence of cholecystitis or biliary ductal dilatation. Pancreas:  No mass or inflammatory changes. Spleen:  Normal appearance.  Upper limits of normal in size. Adrenals/Urinary Tract: Normal adrenal glands. No evidence nephrolithiasis or hydronephrosis. Severe diffuse right renal parenchymal atrophy again seen. An enhancing lesion is seen in the midpole the left kidney which measures 1.8 x 1.7 cm, consistent with a small renal cell carcinoma. No evidence of renal vein or IVC thrombus. Stomach/Bowel: Visualized portion unremarkable. Tiny paraumbilical ventral hernia containing only fat. Vascular/Lymphatic: No pathologically enlarged lymph nodes identified. No abdominal aortic aneurysm. Aortic atherosclerosis incidentally noted. Other:  None. Musculoskeletal:  No suspicious bone lesions identified. IMPRESSION: 1.8 cm enhancing mass in midpole of  left kidney, consistent  with renal cell carcinoma. No evidence of abdominal metastatic disease. Cholelithiasis.  No radiographic evidence of cholecystitis. Tiny paraumbilical ventral hernia containing only fat. Aortic Atherosclerosis (ICD10-I70.0). Electronically Signed   By: Marlaine Hind M.D.   On: 04/16/2019 17:42   IR Radiologist Eval & Mgmt  Result Date: 05/06/2019 Please refer to notes tab for details about interventional procedure. (Op Note)   Labs:  CBC: Recent Labs    05/08/18 2030 05/08/18 2255 09/10/18 0859 03/31/19 1841  WBC 9.1  --  9.0 6.6  HGB 11.7* 10.5* 12.1* 13.2  HCT 38.8* 31.0* 40.1 40.3  PLT 135*  --  145* 153    COAGS: No results for input(s): INR, APTT in the last 8760 hours.  BMP: Recent Labs    05/08/18 2030 05/08/18 2255 07/01/18 1023 09/10/18 0859 03/31/19 1841  NA 134* 136 135 137 136  K 5.4* 4.3 4.1 5.0 3.4*  CL 101 104 100 103 103  CO2 22  --  27 25 21*  GLUCOSE 206* 174* 185* 133* 131*  BUN _0 CALCIUM 9.4  --  9.2 9.4 9.6  CREATININE 1.67* 1.20 1.61* 1.49* 1.28*  GFRNONAA 41*  --  43* 47* 57*  GFRAA 48*  --  50* 55* >60    LIVER FUNCTION TESTS: Recent Labs    05/08/18 2030 03/31/19 1841  BILITOT 1.7* 1.5*  AST 25 22  ALT 19 18  ALKPHOS 59 64  PROT 7.3 7.4  ALBUMIN 3.8 4.0    Assessment and Plan:  I met with [name] and his wife. I reviewed imaging findings with him and in particular CT studies from 05/08/2018 and 04/16/2019.  By my measurements, the left posterior interpolar/lower endophytic lesion measures approximately 1.5 x 1.3 x 1.4 cm on the January study and has a cystic component with probable mild internal enhancement, especially along its inferior aspect.  On the December scan, the lesion measures approximately 1.5 x 1.5 x 1.4 cm with similar imaging characteristics.  I discussed treatment options with [name] and his wife including potential future ablation of the mass.  MRI characterization would be quite helpful but  unfortunately with an indwelling pacemaker it would be difficult to perform an MRI at this time.  Given minimal, if any, lesion growth over a 64-monthperiod last year, I told Mr. CBerriosthat we certainly have time to watch the lesion for more definitive growth.  He is clearly of higher risk for treatment given his chronic kidney disease with baseline GFR of 41-47 mL/min with significant atrophy of the right kidney and cardiac history.  He would need cardiac clearance prior to placement under general anesthesia should we proceed with ablation in the future.  Given that eventual treatment of the left renal mass may result in some further impairment of kidney function, I recommended that Mr. CFuriobe referred to a nephrologist so that he can be more closely followed prior to, and following, potential future renal ablation.  [facility]  I recommended that we perform a follow-up CT with reduced contrast dosing in 5 to 6 months to evaluate for lesion growth.  Technically, the mass is approachable to treatment with percutaneous cryoablation, but he would probably require IV contrast at the time of treatment given endophytic location and small lesion size. I recommended that we more strongly consider cryoablation if we demonstrate definitive growth of the mass by CT. He and his  wife are agreeable to this plan.  Thank you for this interesting consult.  I greatly enjoyed meeting KINNICK MAUS and look forward to participating in their care.  A copy of this report was sent to the requesting provider on this date.  Electronically Signed: Azzie Roup 05/06/2019, 12:04 PM     I spent a total of 30 Minutes in remote  clinical consultation, greater than 50% of which was counseling/coordinating care for a left renal mass.    Visit type: Audio and video [employer]).   Alternative for in-person consultation at [facility], [address], [place], [place]. This  visit type was conducted due to national recommendations for restrictions regarding the COVID-19 Pandemic (e.g. social distancing).  This format is felt to be most appropriate for this patient at this time.  All issues noted in this document were discussed and addressed.

## 2019-05-10 ENCOUNTER — Other Ambulatory Visit (HOSPITAL_COMMUNITY): Payer: Self-pay | Admitting: *Deleted

## 2019-05-10 MED ORDER — METOPROLOL TARTRATE 50 MG PO TABS: 50.0000 mg | ORAL_TABLET | Freq: Two times a day (BID) | ORAL | 0 refills | Status: DC

## 2019-05-17 ENCOUNTER — Ambulatory Visit (INDEPENDENT_AMBULATORY_CARE_PROVIDER_SITE_OTHER): Payer: Medicare Other | Admitting: *Deleted

## 2019-05-17 DIAGNOSIS — Z950 Presence of cardiac pacemaker: Secondary | ICD-10-CM | POA: Diagnosis not present

## 2019-05-17 LAB — CUP PACEART REMOTE DEVICE CHECK
Battery Remaining Longevity: 22 mo
Battery Remaining Percentage: 19 %
Battery Voltage: 2.77 V
Brady Statistic AP VP Percent: 59 %
Brady Statistic AP VS Percent: 1 %
Brady Statistic AS VP Percent: 41 %
Brady Statistic AS VS Percent: 1 %
Brady Statistic RA Percent Paced: 38 %
Brady Statistic RV Percent Paced: 99 %
Date Time Interrogation Session: 20210118021519
Implantable Lead Implant Date: 20131203
Implantable Lead Implant Date: 20131203
Implantable Lead Location: 753859
Implantable Lead Location: 753860
Implantable Lead Model: 1948
Implantable Pulse Generator Implant Date: 20131203
Lead Channel Impedance Value: 400 Ohm
Lead Channel Impedance Value: 640 Ohm
Lead Channel Pacing Threshold Amplitude: 0.75 V
Lead Channel Pacing Threshold Amplitude: 0.875 V
Lead Channel Pacing Threshold Pulse Width: 0.5 ms
Lead Channel Pacing Threshold Pulse Width: 0.8 ms
Lead Channel Sensing Intrinsic Amplitude: 10.1 mV
Lead Channel Sensing Intrinsic Amplitude: 3.3 mV
Lead Channel Setting Pacing Amplitude: 1.125
Lead Channel Setting Pacing Amplitude: 1.75 V
Lead Channel Setting Pacing Pulse Width: 0.8 ms
Lead Channel Setting Sensing Sensitivity: 4 mV
Pulse Gen Model: 2210
Pulse Gen Serial Number: 7408461

## 2019-05-25 ENCOUNTER — Ambulatory Visit: Payer: Medicare Other

## 2019-06-03 ENCOUNTER — Ambulatory Visit: Payer: Medicare Other | Attending: Internal Medicine

## 2019-06-07 ENCOUNTER — Telehealth: Payer: Self-pay | Admitting: Internal Medicine

## 2019-06-07 ENCOUNTER — Telehealth (HOSPITAL_COMMUNITY): Payer: Self-pay | Admitting: Pharmacy Technician

## 2019-06-07 NOTE — Telephone Encounter (Signed)
Spoke with patient's wife about Tikosyn concerns. He is on the [facility] wait list for A-fib but it is not open at the moment. Suggest that patient's wife get physician who writes his prescription to submit a tier exception through insurance in the meantime.  Charlann Boxer, CPhT

## 2019-06-07 NOTE — Telephone Encounter (Signed)
New message   Pt c/o medication issue:  1. Name of Medication: dofetilide (TIKOSYN) 250 MCG capsule  2. How are you currently taking this medication (dosage and times per day)? As written   3. Are you having a reaction (difficulty breathing--STAT)? no  4. What is your medication issue? Patient's wife would like to see if can get tier exception for this medication and she states that she would like a new prescription that she can pickup.Please call to discuss.

## 2019-06-08 MED ORDER — DOFETILIDE 250 MCG PO CAPS: 250.0000 ug | ORAL_CAPSULE | Freq: Two times a day (BID) | ORAL | 1 refills | Status: DC

## 2019-06-08 NOTE — Telephone Encounter (Signed)
Tier exception approved as tier 1 copay for dofetilide through 04/28/20. PA# [id number]. Wife notified. Appt made for follow up as pt is past due.

## 2019-06-08 NOTE — Telephone Encounter (Signed)
Tier exception attempted through [facility] - will await response.

## 2019-06-15 ENCOUNTER — Encounter (HOSPITAL_COMMUNITY): Payer: Self-pay | Admitting: Nurse Practitioner

## 2019-06-15 ENCOUNTER — Ambulatory Visit (HOSPITAL_COMMUNITY)
Admission: RE | Admit: 2019-06-15 | Discharge: 2019-06-15 | Disposition: A | Discharge status: Home / Self Care | Payer: Medicare Other | Source: Ambulatory Visit | Attending: Nurse Practitioner | Admitting: Nurse Practitioner

## 2019-06-15 ENCOUNTER — Other Ambulatory Visit: Payer: Self-pay

## 2019-06-15 VITALS — BP 132/60 | HR 60 | Ht 71.0 in | Wt 201.6 lb

## 2019-06-15 DIAGNOSIS — Z7951 Long term (current) use of inhaled steroids: Secondary | ICD-10-CM | POA: Insufficient documentation

## 2019-06-15 DIAGNOSIS — Z7984 Long term (current) use of oral hypoglycemic drugs: Secondary | ICD-10-CM | POA: Insufficient documentation

## 2019-06-15 DIAGNOSIS — E669 Obesity, unspecified: Secondary | ICD-10-CM | POA: Insufficient documentation

## 2019-06-15 DIAGNOSIS — K219 Gastro-esophageal reflux disease without esophagitis: Secondary | ICD-10-CM | POA: Insufficient documentation

## 2019-06-15 DIAGNOSIS — J45909 Unspecified asthma, uncomplicated: Secondary | ICD-10-CM | POA: Diagnosis not present

## 2019-06-15 DIAGNOSIS — Z87891 Personal history of nicotine dependence: Secondary | ICD-10-CM | POA: Diagnosis not present

## 2019-06-15 DIAGNOSIS — M797 Fibromyalgia: Secondary | ICD-10-CM | POA: Insufficient documentation

## 2019-06-15 DIAGNOSIS — I4819 Other persistent atrial fibrillation: Secondary | ICD-10-CM | POA: Diagnosis present

## 2019-06-15 DIAGNOSIS — Z6828 Body mass index (BMI) 28.0-28.9, adult: Secondary | ICD-10-CM | POA: Insufficient documentation

## 2019-06-15 DIAGNOSIS — Z79899 Other long term (current) drug therapy: Secondary | ICD-10-CM | POA: Insufficient documentation

## 2019-06-15 DIAGNOSIS — E039 Hypothyroidism, unspecified: Secondary | ICD-10-CM | POA: Diagnosis not present

## 2019-06-15 DIAGNOSIS — E785 Hyperlipidemia, unspecified: Secondary | ICD-10-CM | POA: Insufficient documentation

## 2019-06-15 DIAGNOSIS — Z7901 Long term (current) use of anticoagulants: Secondary | ICD-10-CM | POA: Insufficient documentation

## 2019-06-15 DIAGNOSIS — E119 Type 2 diabetes mellitus without complications: Secondary | ICD-10-CM | POA: Insufficient documentation

## 2019-06-15 DIAGNOSIS — Z950 Presence of cardiac pacemaker: Secondary | ICD-10-CM | POA: Insufficient documentation

## 2019-06-15 DIAGNOSIS — I509 Heart failure, unspecified: Secondary | ICD-10-CM | POA: Diagnosis not present

## 2019-06-15 DIAGNOSIS — G4733 Obstructive sleep apnea (adult) (pediatric): Secondary | ICD-10-CM | POA: Insufficient documentation

## 2019-06-15 DIAGNOSIS — Z952 Presence of prosthetic heart valve: Secondary | ICD-10-CM | POA: Diagnosis not present

## 2019-06-15 DIAGNOSIS — G2581 Restless legs syndrome: Secondary | ICD-10-CM | POA: Diagnosis not present

## 2019-06-15 DIAGNOSIS — Z79890 Hormone replacement therapy (postmenopausal): Secondary | ICD-10-CM | POA: Diagnosis not present

## 2019-06-15 DIAGNOSIS — I480 Paroxysmal atrial fibrillation: Secondary | ICD-10-CM

## 2019-06-15 DIAGNOSIS — I350 Nonrheumatic aortic (valve) stenosis: Secondary | ICD-10-CM | POA: Diagnosis not present

## 2019-06-15 DIAGNOSIS — I110 Hypertensive heart disease with heart failure: Secondary | ICD-10-CM | POA: Insufficient documentation

## 2019-06-15 LAB — BASIC METABOLIC PANEL
Anion gap: 11 (ref 5–15)
BUN: 20 mg/dL (ref 8–23)
CO2: 25 mmol/L (ref 22–32)
Calcium: 9.5 mg/dL (ref 8.9–10.3)
Chloride: 102 mmol/L (ref 98–111)
Creatinine, Ser: 1.58 mg/dL — ABNORMAL HIGH (ref 0.61–1.24)
GFR calc Af Amer: 51 mL/min — ABNORMAL LOW (ref >60)
GFR calc non Af Amer: 44 mL/min — ABNORMAL LOW (ref >60)
Glucose, Bld: 111 mg/dL — ABNORMAL HIGH (ref 70–99)
Potassium: 4.6 mmol/L (ref 3.5–5.1)
Sodium: 138 mmol/L (ref 135–145)

## 2019-06-15 LAB — MAGNESIUM: Magnesium: 2.2 mg/dL (ref 1.7–2.4)

## 2019-06-15 NOTE — Progress Notes (Addendum)
Date:  06/15/2019   ID:  ERHARD CARAMANICA, DOB 1951-03-27, MRN [id number]  Location: office   Provider location: [address] [place]  Evaluation Performed: Follow-up visit  PCP:  Velna Hatchet, MD   Cardiologist:    Dr Nahser/ Bensimhon Electrophysiologist:  Dr Rayann Heman  Chief Complaint:  Afib/chronic Phyllis Ginger use   History of Present Illness:    [name] is a 69 y.o. male who presents for f/u of tikosyn use. He appears to be in SR today.   Since last being seen in our clinic, the patient reports doing reasonably well. Despite being on dofetilide his afib burden remains at 40% per device reports. No further cardioversion since May of 2020. CHF is stable. No issues with eliquis 5 mg bid for a CHA2DS2VASc score of 4.  Today, he denies symptoms of palpitations, chest pain, lower extremity edema, dizziness, presyncope, or syncope.  The patient is otherwise without complaint today.  The patient denies symptoms of fevers, chills, cough, or new SOB worrisome for COVID 19.  Past Medical History:  Diagnosis Date  . Aortic stenosis, mild   . Arthritis of hand    "just a little bit in both hands" (03/31/2012)  . Asthma    "little bit" (03/31/2012)  . Atrial fibrillation [facility])    dx '04; DCCV '04, placed on flecainide, failed DCCV 04/2010, flecainide stopped; s/p successful A.fib ablation 01/31/12  . CHF (congestive heart failure) ([place])   . Diabetes mellitus without complication (HCC)    borderline  . DJD (degenerative joint disease)   . Exertional dyspnea   . Fibromyalgia   . First degree AV block    post ablation, ~423ms  . GERD (gastroesophageal reflux disease)   . Heart murmur   . Hyperlipidemia   . Hypertension   . Hypothyroidism    S/P radiation  . Left atrial enlargement    LA size 10mm by echo 11/21/11  . Mitral regurgitation    trivial  . Obesity   . Obstructive sleep apnea    mild by sleep study 2013; pt stated he does not have a machine because  "it wasn't bad enough for him to have one"  . Pacemaker 03/31/2012  . Restless leg syndrome   . Second degree AV block   . Visit for monitoring Tikosyn therapy 04/16/2016    Past Surgical History:  Procedure Laterality Date  . AORTIC VALVE REPLACEMENT N/A 08/22/2015   Procedure: AORTIC VALVE REPLACEMENT (AVR);  Surgeon: Ivin Poot, MD;  Location: [place];  Service: Open Heart Surgery;  Laterality: N/A;  . ATRIAL FIBRILLATION ABLATION  01/30/2012   PVI by Dr. Rayann Heman  . ATRIAL FIBRILLATION ABLATION N/A 01/31/2012   Procedure: ATRIAL FIBRILLATION ABLATION;  Surgeon: Thompson Grayer, MD;  Location: [facility] CATH LAB;  Service: Cardiovascular;  Laterality: N/A;  . BACK SURGERY     X 3  . CARDIAC CATHETERIZATION N/A 08/09/2015   Procedure: Right/Left Heart Cath and Coronary Angiography;  Surgeon: Peter M Martinique, MD;  Location: [place] CV LAB;  Service: Cardiovascular;  Laterality: N/A;  . CARDIAC CATHETERIZATION N/A 02/05/2016   Procedure: Right/Left Heart Cath and Coronary Angiography;  Surgeon: Jettie Booze, MD;  Location: [place] CV LAB;  Service: Cardiovascular;  Laterality: N/A;  . CARDIAC CATHETERIZATION N/A 04/17/2016   Procedure: Right Heart Cath;  Surgeon: Jolaine Artist, MD;  Location: [place] CV LAB;  Service: Cardiovascular;  Laterality: N/A;  . CARDIOVASCULAR STRESS  TEST  03/12/2002   EF 48%, NO EVIDENCE OF ISCHEMIA  . CARDIOVERSION  01/2012; 03/31/2012  . CARDIOVERSION N/A 02/25/2012   Procedure: CARDIOVERSION;  Surgeon: Thompson Grayer, MD;  Location: [facility] CATH LAB;  Service: Cardiovascular;  Laterality: N/A;  . CARDIOVERSION N/A 03/31/2012   Procedure: CARDIOVERSION;  Surgeon: Thompson Grayer, MD;  Location: [facility] CATH LAB;  Service: Cardiovascular;  Laterality: N/A;  . CARDIOVERSION N/A 11/23/2015   Procedure: CARDIOVERSION;  Surgeon: Larey Dresser, MD;  Location: [facility] ENDOSCOPY;  Service: Cardiovascular;  Laterality: N/A;  . CARDIOVERSION N/A 04/08/2016   Procedure:  CARDIOVERSION;  Surgeon: Jolaine Artist, MD;  Location: [facility] ENDOSCOPY;  Service: Cardiovascular;  Laterality: N/A;  . CARDIOVERSION N/A 09/14/2018   Procedure: CARDIOVERSION;  Surgeon: Josue Hector, MD;  Location: [place];  Service: Cardiovascular;  Laterality: N/A;  . CLIPPING OF ATRIAL APPENDAGE N/A 08/22/2015   Procedure: CLIPPING OF ATRIAL APPENDAGE;  Surgeon: Ivin Poot, MD;  Location: [place];  Service: Open Heart Surgery;  Laterality: N/A;  . DOPPLER ECHOCARDIOGRAPHY  03/11/2002   EF 70-75%  . FINGER SURGERY Left    Middle finger  . FINGER TENDON REPAIR  1980's   "right little finger" (03/31/2012)  . INSERT / REPLACE / REMOVE PACEMAKER     [employer]  . IR RADIOLOGIST EVAL & MGMT  05/06/2019  . LEFT AND RIGHT HEART CATHETERIZATION WITH CORONARY ANGIOGRAM N/A 10/27/2012   Procedure: LEFT AND RIGHT HEART CATHETERIZATION WITH CORONARY ANGIOGRAM;  Surgeon: Laverda Page, MD;  Location: [facility] CATH LAB;  Service: Cardiovascular;  Laterality: N/A;  . LUMBAR DISC SURGERY  1980's X2;  2000's  . MAZE N/A 08/22/2015   Procedure: MAZE;  Surgeon: Ivin Poot, MD;  Location: [place];  Service: Open Heart Surgery;  Laterality: N/A;  . PACEMAKER INSERTION  03/31/2012   STJ Accent DR pacemaker implanted by Dr Rayann Heman  . PERMANENT PACEMAKER INSERTION N/A 03/31/2012   Procedure: PERMANENT PACEMAKER INSERTION;  Surgeon: Thompson Grayer, MD;  Location: [facility] CATH LAB;  Service: Cardiovascular;  Laterality: N/A;  . TEE WITHOUT CARDIOVERSION  01/30/2012   Procedure: TRANSESOPHAGEAL ECHOCARDIOGRAM (TEE);  Surgeon: Larey Dresser, MD;  Location: [place];  Service: Cardiovascular;  Laterality: N/A;  ablation next day  . TEE WITHOUT CARDIOVERSION N/A 08/22/2015   Procedure: TRANSESOPHAGEAL ECHOCARDIOGRAM (TEE);  Surgeon: Ivin Poot, MD;  Location: [place];  Service: Open Heart Surgery;  Laterality: N/A;  . US ECHOCARDIOGRAPHY  08/07/2009   EF 55-60%    Current Outpatient Medications  Medication Sig  Dispense Refill  . albuterol (PROVENTIL HFA;VENTOLIN HFA) 108 (90 Base) MCG/ACT inhaler Inhale 2 puffs into the lungs every 6 (six) hours as needed for wheezing or shortness of breath. 1 Inhaler 5  . amLODipine (NORVASC) 10 MG tablet Take 0.5 tablets (5 mg total) by mouth daily. 30 tablet 11  . apixaban (ELIQUIS) 5 MG TABS tablet TAKE 1 TABLET(5 MG) BY MOUTH TWICE DAILY 180 tablet 3  . budesonide-formoterol (SYMBICORT) 160-4.5 MCG/ACT inhaler Inhale 2 puffs into the lungs 2 (two) times daily. 1 Inhaler 6  . Cholecalciferol (VITAMIN D) 2000 UNITS CAPS Take 8,000 Units by mouth at bedtime.     . dofetilide (TIKOSYN) 250 MCG capsule Take 1 capsule (250 mcg total) by mouth 2 (two) times daily. 180 capsule 1  . fexofenadine (ALLEGRA) 180 MG tablet Take 1 tablet (180 mg total) by mouth daily. 30 tablet 1  . gabapentin (NEURONTIN) 300 MG capsule Take 600-1,200 mg by  mouth See admin instructions. Take 600 mg by mouth in the morning, 600 mg at lunchtime, and 1,200 mg at bedtime  3  . guaiFENesin (MUCINEX) 600 MG 12 hr tablet Take 1,200 mg by mouth 2 (two) times daily.     [name] HYDROcodone-acetaminophen (NORCO/VICODIN) 5-325 MG tablet Take 1 tablet by mouth at bedtime as needed for moderate pain.    [name] levothyroxine (SYNTHROID, LEVOTHROID) 175 MCG tablet Take 175 mcg by mouth daily before breakfast.    . MAGNESIUM PO Take 1 tablet by mouth at bedtime.    . metFORMIN (GLUCOPHAGE-XR) 500 MG 24 hr tablet Take 500 mg by mouth at bedtime.    . metoprolol tartrate (LOPRESSOR) 50 MG tablet Take 1 tablet (50 mg total) by mouth 2 (two) times daily. 10 tablet 0  . ondansetron (ZOFRAN ODT) 4 MG disintegrating tablet Take 1 tablet (4 mg total) by mouth every 8 (eight) hours as needed for nausea. 20 tablet 0  . pantoprazole (PROTONIX) 40 MG tablet Take 40 mg by mouth daily.    . polyethylene glycol powder (GLYCOLAX/MIRALAX) powder Take 17 g by mouth daily. (Patient taking differently: Take 17 g by mouth daily as needed for  mild constipation. ) 500 g 0  . potassium chloride (K-DUR) 20 MEQ tablet Take 1 tablet (20 mEq total) by mouth 2 (two) times daily. (Patient taking differently: Take 20-40 mEq by mouth See admin instructions. Take 40 meq twice daily on days taking Torsemide, on the other days take 20 meq twice daily) 180 tablet 3  . rOPINIRole (REQUIP) 1 MG tablet Take 1 tablet (1 mg total) by mouth daily. (Patient taking differently: Take 1 mg by mouth at bedtime. ) 90 tablet 2  . rosuvastatin (CRESTOR) 10 MG tablet Take 10 mg by mouth at bedtime.     [name] spironolactone (ALDACTONE) 25 MG tablet Take 1 tablet (25 mg total) by mouth daily. 90 tablet 3  . torsemide (DEMADEX) 20 MG tablet TAKE 2 TABLETS BY MOUTH  DAILY 180 tablet 3  . traMADol-acetaminophen (ULTRACET) 37.5-325 MG tablet Take 2 tablets by mouth 2 (two) times daily.    . traZODone (DESYREL) 50 MG tablet Take 12.5 mg by mouth at bedtime.      No current facility-administered medications for this encounter.    Allergies:   Meloxicam and Vancomycin   Social History:  The patient  reports that he quit smoking about 27 years ago. His smoking use included cigarettes. He has a 7.50 pack-year smoking history. He has never used smokeless tobacco. He reports current alcohol use. He reports current drug use. Drug: Marijuana.   Family History:  The patient's family history includes Heart failure in his mother; Stroke in his father.   ROS:  Please see the history of present illness.   All other systems are personally reviewed and negative.   Exam: GEN- The patient is well appearing, alert and oriented x 3 today.   Head- normocephalic, atraumatic Eyes-  Sclera clear, conjunctiva pink Ears- hearing intact Oropharynx- clear Neck- supple, no JVP Lymph- no cervical lymphadenopathy Lungs- Clear to ausculation bilaterally, normal work of breathing Heart- Regular rate and rhythm,(paced) no murmurs, rubs or gallops, PMI not laterally displaced GI- soft, NT, ND, +  BS Extremities- no clubbing, cyanosis, or edema MS- no significant deformity or atrophy Skin- no rash or lesion Psych- euthymic mood, full affect Neuro- strength and sensation are intact  Vital Signs:  BP 132/60   Pulse 60   Ht 5\' 11"  (1.803  m)   Wt 91.4 kg   BMI 28.12 kg/m     Labs/Other Tests and Data Reviewed:    Recent Labs: 09/10/2018: Magnesium 2.3 03/31/2019: ALT 18; BUN 10; Creatinine, Ser 1.28; Hemoglobin 13.2; Platelets 153; Potassium 3.4; Sodium 136   Wt Readings from Last 3 Encounters:  06/15/19 91.4 kg  03/31/19 91.2 kg  09/14/18 99.2 kg     Other studies personally reviewed: Additional studies/ records that were reviewed today include:  Review of the above records today demonstrates: as above Echo 10/1017- EF 60% Prior radiographs: CT abdomen from 05/08/2018 is reviewed   Last device remote is reviewed from [place] PDF dated 05/18/18 which reveals normal device function, afib burden is 40%, he has been in persistent afib since 9/19   ASSESSMENT & PLAN:     1. Persistent afib On eliquis S/p MAZE and LAA amputation On tikosyn 250 mcg bid  AV dual paced toady at 60 bpm  3. HTN Stable No change required today  4. COVID 19 screen The patient denies symptoms of COVID 19 at this time.  Pt defers covid vaccine at this time   5. PPM Per device clinic/Dr. Allred   Follow-up:     Dr. Rayann Heman in 6 months   Current medicines are reviewed at length with the patient today.   The patient does not have concerns regarding his medicines.  The following changes were made today:  none  Labs/ tests ordered today include:  Orders Placed This Encounter  Procedures  . Basic metabolic panel  . Magnesium  . EKG 12-Lead   Patient Risk:  after full review of this patients clinical status, I feel that they are at moderate risk at this time.  Today, I have spent 23 minutes with the patient with telehealth technology discussing afib management .    Signed, Roderic Palau, NP  06/15/2019 11:43 AM     [address] [place] [address] [phone] (office) [phone] (fax)

## 2019-06-30 ENCOUNTER — Other Ambulatory Visit (HOSPITAL_COMMUNITY): Payer: Self-pay | Admitting: Internal Medicine

## 2019-08-16 ENCOUNTER — Ambulatory Visit (INDEPENDENT_AMBULATORY_CARE_PROVIDER_SITE_OTHER): Payer: Medicare Other | Admitting: *Deleted

## 2019-08-16 DIAGNOSIS — I4819 Other persistent atrial fibrillation: Secondary | ICD-10-CM | POA: Diagnosis not present

## 2019-08-16 LAB — CUP PACEART REMOTE DEVICE CHECK
Battery Remaining Longevity: 17 mo
Battery Remaining Percentage: 15 %
Battery Voltage: 2.74 V
Brady Statistic AP VP Percent: 61 %
Brady Statistic AP VS Percent: 1 %
Brady Statistic AS VP Percent: 39 %
Brady Statistic AS VS Percent: 1 %
Brady Statistic RA Percent Paced: 42 %
Brady Statistic RV Percent Paced: 99 %
Date Time Interrogation Session: 20210419033903
Implantable Lead Implant Date: 20131203
Implantable Lead Implant Date: 20131203
Implantable Lead Location: 753859
Implantable Lead Location: 753860
Implantable Lead Model: 1948
Implantable Pulse Generator Implant Date: 20131203
Lead Channel Impedance Value: 430 Ohm
Lead Channel Impedance Value: 630 Ohm
Lead Channel Pacing Threshold Amplitude: 0.875 V
Lead Channel Pacing Threshold Amplitude: 0.875 V
Lead Channel Pacing Threshold Pulse Width: 0.5 ms
Lead Channel Pacing Threshold Pulse Width: 0.8 ms
Lead Channel Sensing Intrinsic Amplitude: 10.1 mV
Lead Channel Sensing Intrinsic Amplitude: 3.4 mV
Lead Channel Setting Pacing Amplitude: 1.125
Lead Channel Setting Pacing Amplitude: 1.875
Lead Channel Setting Pacing Pulse Width: 0.8 ms
Lead Channel Setting Sensing Sensitivity: 4 mV
Pulse Gen Model: 2210
Pulse Gen Serial Number: 7408461

## 2019-08-17 NOTE — Progress Notes (Signed)
PPM Remote  

## 2019-09-04 ENCOUNTER — Other Ambulatory Visit (HOSPITAL_COMMUNITY): Payer: Self-pay | Admitting: Internal Medicine

## 2019-09-09 ENCOUNTER — Emergency Department (HOSPITAL_BASED_OUTPATIENT_CLINIC_OR_DEPARTMENT_OTHER)
Admission: EM | Admit: 2019-09-09 | Discharge: 2019-09-10 | Disposition: A | Discharge status: Home / Self Care | Payer: Medicare Other | Attending: Emergency Medicine | Admitting: Emergency Medicine

## 2019-09-09 ENCOUNTER — Encounter (HOSPITAL_BASED_OUTPATIENT_CLINIC_OR_DEPARTMENT_OTHER): Payer: Self-pay | Admitting: Emergency Medicine

## 2019-09-09 ENCOUNTER — Emergency Department (HOSPITAL_BASED_OUTPATIENT_CLINIC_OR_DEPARTMENT_OTHER): Payer: Medicare Other

## 2019-09-09 ENCOUNTER — Other Ambulatory Visit: Payer: Self-pay

## 2019-09-09 DIAGNOSIS — Z7984 Long term (current) use of oral hypoglycemic drugs: Secondary | ICD-10-CM | POA: Insufficient documentation

## 2019-09-09 DIAGNOSIS — Z79899 Other long term (current) drug therapy: Secondary | ICD-10-CM | POA: Diagnosis not present

## 2019-09-09 DIAGNOSIS — Z20822 Contact with and (suspected) exposure to covid-19: Secondary | ICD-10-CM | POA: Diagnosis not present

## 2019-09-09 DIAGNOSIS — R109 Unspecified abdominal pain: Secondary | ICD-10-CM | POA: Insufficient documentation

## 2019-09-09 DIAGNOSIS — I509 Heart failure, unspecified: Secondary | ICD-10-CM | POA: Diagnosis not present

## 2019-09-09 DIAGNOSIS — Z87891 Personal history of nicotine dependence: Secondary | ICD-10-CM | POA: Diagnosis not present

## 2019-09-09 DIAGNOSIS — I110 Hypertensive heart disease with heart failure: Secondary | ICD-10-CM | POA: Diagnosis not present

## 2019-09-09 DIAGNOSIS — Z7901 Long term (current) use of anticoagulants: Secondary | ICD-10-CM | POA: Diagnosis not present

## 2019-09-09 DIAGNOSIS — R197 Diarrhea, unspecified: Secondary | ICD-10-CM | POA: Diagnosis not present

## 2019-09-09 DIAGNOSIS — E119 Type 2 diabetes mellitus without complications: Secondary | ICD-10-CM | POA: Insufficient documentation

## 2019-09-09 DIAGNOSIS — R079 Chest pain, unspecified: Secondary | ICD-10-CM | POA: Diagnosis present

## 2019-09-09 DIAGNOSIS — R112 Nausea with vomiting, unspecified: Secondary | ICD-10-CM

## 2019-09-09 LAB — BASIC METABOLIC PANEL
Anion gap: 13 (ref 5–15)
BUN: 15 mg/dL (ref 8–23)
CO2: 24 mmol/L (ref 22–32)
Calcium: 10.1 mg/dL (ref 8.9–10.3)
Chloride: 101 mmol/L (ref 98–111)
Creatinine, Ser: 1.53 mg/dL — ABNORMAL HIGH (ref 0.61–1.24)
GFR calc Af Amer: 53 mL/min — ABNORMAL LOW (ref >60)
GFR calc non Af Amer: 45 mL/min — ABNORMAL LOW (ref >60)
Glucose, Bld: 114 mg/dL — ABNORMAL HIGH (ref 70–99)
Potassium: 4.3 mmol/L (ref 3.5–5.1)
Sodium: 138 mmol/L (ref 135–145)

## 2019-09-09 LAB — CBC
HCT: 46.5 % (ref 39.0–52.0)
Hemoglobin: 15.4 g/dL (ref 13.0–17.0)
MCH: 30 pg (ref 26.0–34.0)
MCHC: 33.1 g/dL (ref 30.0–36.0)
MCV: 90.5 fL (ref 80.0–100.0)
Platelets: 206 10*3/uL (ref 150–400)
RBC: 5.14 MIL/uL (ref 4.22–5.81)
RDW: 13.3 % (ref 11.5–15.5)
WBC: 11.3 10*3/uL — ABNORMAL HIGH (ref 4.0–10.5)
nRBC: 0 % (ref 0.0–0.2)

## 2019-09-09 LAB — SARS CORONAVIRUS 2 BY RT PCR (HOSPITAL ORDER, PERFORMED IN ~~LOC~~ HOSPITAL LAB): SARS Coronavirus 2: NEGATIVE

## 2019-09-09 LAB — HEPATIC FUNCTION PANEL
ALT: 20 U/L (ref 0–44)
AST: 23 U/L (ref 15–41)
Albumin: 4.8 g/dL (ref 3.5–5.0)
Alkaline Phosphatase: 77 U/L (ref 38–126)
Bilirubin, Direct: 0.3 mg/dL — ABNORMAL HIGH (ref 0.0–0.2)
Indirect Bilirubin: 1.3 mg/dL — ABNORMAL HIGH (ref 0.3–0.9)
Total Bilirubin: 1.6 mg/dL — ABNORMAL HIGH (ref 0.3–1.2)
Total Protein: 8.4 g/dL — ABNORMAL HIGH (ref 6.5–8.1)

## 2019-09-09 LAB — TROPONIN I (HIGH SENSITIVITY)
Troponin I (High Sensitivity): 24 ng/L — ABNORMAL HIGH (ref <18)
Troponin I (High Sensitivity): 23 ng/L — ABNORMAL HIGH (ref <18)

## 2019-09-09 LAB — BRAIN NATRIURETIC PEPTIDE: B Natriuretic Peptide: 238.3 pg/mL — ABNORMAL HIGH (ref 0.0–100.0)

## 2019-09-09 LAB — LIPASE, BLOOD: Lipase: 24 U/L (ref 11–51)

## 2019-09-09 MED ORDER — LIDOCAINE VISCOUS HCL 2 % MT SOLN
15.0000 mL | Freq: Once | OROMUCOSAL | Status: AC
  Administered 2019-09-09: 15 mL via ORAL
  Filled 2019-09-09: qty 15

## 2019-09-09 MED ORDER — MORPHINE SULFATE (PF) 4 MG/ML IV SOLN
4.0000 mg | Freq: Once | INTRAVENOUS | Status: AC
  Administered 2019-09-09: 4 mg via INTRAVENOUS
  Filled 2019-09-09: qty 1

## 2019-09-09 MED ORDER — DIPHENHYDRAMINE HCL 50 MG/ML IJ SOLN
25.0000 mg | Freq: Once | INTRAMUSCULAR | Status: AC
  Administered 2019-09-09: 25 mg via INTRAVENOUS
  Filled 2019-09-09: qty 1

## 2019-09-09 MED ORDER — IOHEXOL 350 MG/ML SOLN
100.0000 mL | Freq: Once | INTRAVENOUS | Status: AC | PRN
  Administered 2019-09-09: 100 mL via INTRAVENOUS

## 2019-09-09 MED ORDER — METOCLOPRAMIDE HCL 5 MG/ML IJ SOLN
10.0000 mg | Freq: Once | INTRAMUSCULAR | Status: AC
  Administered 2019-09-09: 10 mg via INTRAVENOUS
  Filled 2019-09-09: qty 2

## 2019-09-09 MED ORDER — PANTOPRAZOLE SODIUM 40 MG IV SOLR
40.0000 mg | Freq: Once | INTRAVENOUS | Status: AC
  Administered 2019-09-09: 40 mg via INTRAVENOUS
  Filled 2019-09-09: qty 40

## 2019-09-09 MED ORDER — ALUM & MAG HYDROXIDE-SIMETH 200-200-20 MG/5ML PO SUSP
30.0000 mL | Freq: Once | ORAL | Status: AC
  Administered 2019-09-09: 30 mL via ORAL
  Filled 2019-09-09: qty 30

## 2019-09-09 NOTE — ED Notes (Signed)
  Patient states he is feeling much better.  PO challenge with water and patient tolerating well.

## 2019-09-09 NOTE — ED Triage Notes (Signed)
Pt arrives with chest pain and shortness of breath starting this evening while riding in the car. Sharp chest pain with no radiation.

## 2019-09-09 NOTE — ED Provider Notes (Signed)
[place] EMERGENCY DEPARTMENT Provider Note   CSN: [id number] Arrival date & time: 09/09/19  1935     History Chief Complaint  Patient presents with  . Chest Pain    [name] is a 69 y.o. male.  HPI     69yo male with history of CHF, atrial fibrillation, diabetes, hypertension, aortic valve replacement, pacemaker who presents with concern for nausea, vomiting, abdominal pain, diarrhea, with chest pain developing in route to the emergency department.   Reports for the last few days has been unable to eat much due to nausea and vomiting.  Vomited approximately 5 times yesterday and had diarrhea last night and this morning.  Abdominal pain is located in the lower abdomen, severe cramping.    While en route to the hospital in the car he developed right sided chest pain, sharp, no radiation.  On my history he denies any shortness of breath, reports his breathing is at baseline but he was breathing fast on arrival because of pain in his chest and abdomen.  No known sick contacts. No fevers. Did eat salad the other day was suspicious.  Has had prior episodes of abdominal pain, was scheduled for GI follow up but then due to pandemic was unable to do so.  Has also had some chronic nausea and difficulty eating.     Past Medical History:  Diagnosis Date  . Aortic stenosis, mild   . Arthritis of hand    "just a little bit in both hands" (03/31/2012)  . Asthma    "little bit" (03/31/2012)  . Atrial fibrillation [facility])    dx '04; DCCV '04, placed on flecainide, failed DCCV 04/2010, flecainide stopped; s/p successful A.fib ablation 01/31/12  . CHF (congestive heart failure) ([place])   . Diabetes mellitus without complication (HCC)    borderline  . DJD (degenerative joint disease)   . Exertional dyspnea   . Fibromyalgia   . First degree AV block    post ablation, ~468ms  . GERD (gastroesophageal reflux disease)   . Heart murmur   . Hyperlipidemia   . Hypertension   .  Hypothyroidism    S/P radiation  . Left atrial enlargement    LA size 24mm by echo 11/21/11  . Mitral regurgitation    trivial  . Obesity   . Obstructive sleep apnea    mild by sleep study 2013; pt stated he does not have a machine because "it wasn't bad enough for him to have one"  . Pacemaker 03/31/2012  . Restless leg syndrome   . Second degree AV block   . Visit for monitoring Tikosyn therapy 04/16/2016     Patient Active Problem List   Diagnosis Date Noted  . Typical atrial flutter ([place])   . Chronic respiratory failure ([place]) 05/23/2016  . CAD (coronary artery disease) 05/23/2016  . Persistent atrial fibrillation ([place])   . Atypical atrial flutter ([place])   . Visit for monitoring Tikosyn therapy 04/16/2016  . Diastolic heart failure ([place]) 02/03/2016  . Cough 12/06/2015  . Hallucinations 09/04/2015  . Lymphadenopathy 09/04/2015  . Ascending aortic aneurysm ([place]) 09/04/2015  . Hypoxia 09/02/2015  . S/P aortic valve replacement with bioprosthetic valve 09/02/2015  . Pleural effusion 09/02/2015  . S/P AVR 08/22/2015  . GERD (gastroesophageal reflux disease) 09/20/2014  . 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 Dyspnea 09/15/2013  . Pulmonary hypertension ([place]) 09/15/2013  . Shortness of breath 05/05/2013  . 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 Bradycardia 03/31/2012  . Second degree AV  block 03/31/2012  . AV block, 1st degree 02/01/2012  . PAF (paroxysmal atrial fibrillation) ([place]) 11/13/2011  . Fatigue 11/13/2011  . Hypertension 11/13/2011  . Aortic stenosis 11/13/2011  . Sleep apnea 11/13/2011    Past Surgical History:  Procedure Laterality Date  . AORTIC VALVE REPLACEMENT N/A 08/22/2015   Procedure: AORTIC VALVE REPLACEMENT (AVR);  Surgeon: Ivin Poot, MD;  Location: [place];  Service: Open Heart Surgery;  Laterality: N/A;  . ATRIAL FIBRILLATION ABLATION  01/30/2012   PVI by Dr. Rayann Heman  . ATRIAL FIBRILLATION ABLATION N/A 01/31/2012   Procedure: ATRIAL FIBRILLATION ABLATION;   Surgeon: Thompson Grayer, MD;  Location: [facility] CATH LAB;  Service: Cardiovascular;  Laterality: N/A;  . BACK SURGERY     X 3  . CARDIAC CATHETERIZATION N/A 08/09/2015   Procedure: Right/Left Heart Cath and Coronary Angiography;  Surgeon: Peter M Martinique, MD;  Location: [place] CV LAB;  Service: Cardiovascular;  Laterality: N/A;  . CARDIAC CATHETERIZATION N/A 02/05/2016   Procedure: Right/Left Heart Cath and Coronary Angiography;  Surgeon: Jettie Booze, MD;  Location: [place] CV LAB;  Service: Cardiovascular;  Laterality: N/A;  . CARDIAC CATHETERIZATION N/A 04/17/2016   Procedure: Right Heart Cath;  Surgeon: Jolaine Artist, MD;  Location: [place] CV LAB;  Service: Cardiovascular;  Laterality: N/A;  . CARDIOVASCULAR STRESS TEST  03/12/2002   EF 48%, NO EVIDENCE OF ISCHEMIA  . CARDIOVERSION  01/2012; 03/31/2012  . CARDIOVERSION N/A 02/25/2012   Procedure: CARDIOVERSION;  Surgeon: Thompson Grayer, MD;  Location: [facility] CATH LAB;  Service: Cardiovascular;  Laterality: N/A;  . CARDIOVERSION N/A 03/31/2012   Procedure: CARDIOVERSION;  Surgeon: Thompson Grayer, MD;  Location: [facility] CATH LAB;  Service: Cardiovascular;  Laterality: N/A;  . CARDIOVERSION N/A 11/23/2015   Procedure: CARDIOVERSION;  Surgeon: Larey Dresser, MD;  Location: [facility] ENDOSCOPY;  Service: Cardiovascular;  Laterality: N/A;  . CARDIOVERSION N/A 04/08/2016   Procedure: CARDIOVERSION;  Surgeon: Jolaine Artist, MD;  Location: [facility] ENDOSCOPY;  Service: Cardiovascular;  Laterality: N/A;  . CARDIOVERSION N/A 09/14/2018   Procedure: CARDIOVERSION;  Surgeon: Josue Hector, MD;  Location: [place];  Service: Cardiovascular;  Laterality: N/A;  . CLIPPING OF ATRIAL APPENDAGE N/A 08/22/2015   Procedure: CLIPPING OF ATRIAL APPENDAGE;  Surgeon: Ivin Poot, MD;  Location: [place];  Service: Open Heart Surgery;  Laterality: N/A;  . DOPPLER ECHOCARDIOGRAPHY  03/11/2002   EF 70-75%  . FINGER SURGERY Left    Middle finger  . FINGER TENDON  REPAIR  1980's   "right little finger" (03/31/2012)  . INSERT / REPLACE / REMOVE PACEMAKER     St Jude  . IR RADIOLOGIST EVAL & MGMT  05/06/2019  . LEFT AND RIGHT HEART CATHETERIZATION WITH CORONARY ANGIOGRAM N/A 10/27/2012   Procedure: LEFT AND RIGHT HEART CATHETERIZATION WITH CORONARY ANGIOGRAM;  Surgeon: Laverda Page, MD;  Location: [facility] CATH LAB;  Service: Cardiovascular;  Laterality: N/A;  . LUMBAR DISC SURGERY  1980's X2;  2000's  . MAZE N/A 08/22/2015   Procedure: MAZE;  Surgeon: Ivin Poot, MD;  Location: [place];  Service: Open Heart Surgery;  Laterality: N/A;  . PACEMAKER INSERTION  03/31/2012   STJ Accent DR pacemaker implanted by Dr Rayann Heman  . PERMANENT PACEMAKER INSERTION N/A 03/31/2012   Procedure: PERMANENT PACEMAKER INSERTION;  Surgeon: Thompson Grayer, MD;  Location: [facility] CATH LAB;  Service: Cardiovascular;  Laterality: N/A;  . TEE WITHOUT CARDIOVERSION  01/30/2012   Procedure: TRANSESOPHAGEAL ECHOCARDIOGRAM (TEE);  Surgeon: Kirk Ruths  Claris Gladden, MD;  Location: [place];  Service: Cardiovascular;  Laterality: N/A;  ablation next day  . TEE WITHOUT CARDIOVERSION N/A 08/22/2015   Procedure: TRANSESOPHAGEAL ECHOCARDIOGRAM (TEE);  Surgeon: Ivin Poot, MD;  Location: [place];  Service: Open Heart Surgery;  Laterality: N/A;  . US ECHOCARDIOGRAPHY  08/07/2009   EF 55-60%       Family History  Problem Relation Age of Onset  . Heart failure Mother   . Stroke Father     Social History   Tobacco Use  . Smoking status: Former Smoker    Packs/day: 0.50    Years: 15.00    Pack years: 7.50    Types: Cigarettes    Quit date: 07/18/1991    Years since quitting: 28.1  . Smokeless tobacco: Never Used  Substance Use Topics  . Alcohol use: Yes    Comment: occasional  . Drug use: Yes    Types: Marijuana    Comment: 02/2019    Home Medications Prior to Admission medications   Medication Sig Start Date End Date Taking? Authorizing Provider  albuterol (PROVENTIL HFA;VENTOLIN HFA)  108 (90 Base) MCG/ACT inhaler Inhale 2 puffs into the lungs every 6 (six) hours as needed for wheezing or shortness of breath. 12/28/15   Collene Gobble, MD  amLODipine (NORVASC) 10 MG tablet Take 0.5 tablets (5 mg total) by mouth daily. 09/18/15   Nahser, Wonda Cheng, MD  apixaban (ELIQUIS) 5 MG TABS tablet TAKE 1 TABLET(5 MG) BY MOUTH TWICE DAILY 02/25/19   Bensimhon, Shaune Pascal, MD  budesonide-formoterol [facility]) 160-4.5 MCG/ACT inhaler Inhale 2 puffs into the lungs 2 (two) times daily. 11/13/15   Collene Gobble, MD  Cholecalciferol (VITAMIN D) 2000 UNITS CAPS Take 8,000 Units by mouth at bedtime.     [provider]  dofetilide (TIKOSYN) 250 MCG capsule Take 1 capsule (250 mcg total) by mouth 2 (two) times daily. 06/08/19   Sherran Needs, NP  fexofenadine (ALLEGRA) 180 MG tablet Take 1 tablet (180 mg total) by mouth daily. 03/01/14   Collene Gobble, MD  gabapentin (NEURONTIN) 300 MG capsule Take 600-1,200 mg by mouth See admin instructions. Take 600 mg by mouth in the morning, 600 mg at lunchtime, and 1,200 mg at bedtime 09/13/14   [provider]  guaiFENesin (MUCINEX) 600 MG 12 hr tablet Take 1,200 mg by mouth 2 (two) times daily.     [provider]  HYDROcodone-acetaminophen (NORCO/VICODIN) 5-325 MG tablet Take 1 tablet by mouth at bedtime as needed for moderate pain.    [provider]  levothyroxine (SYNTHROID, LEVOTHROID) 175 MCG tablet Take 175 mcg by mouth daily before breakfast.    [provider]  MAGNESIUM PO Take 1 tablet by mouth at bedtime.    [provider]  metFORMIN (GLUCOPHAGE-XR) 500 MG 24 hr tablet Take 500 mg by mouth at bedtime.    [provider]  metoprolol tartrate (LOPRESSOR) 50 MG tablet TAKE 1 TABLET BY MOUTH 2  TIMES DAILY. 06/30/19   Larey Dresser, MD  ondansetron (ZOFRAN ODT) 4 MG disintegrating tablet Take 1 tablet (4 mg total) by mouth every 8 (eight) hours as needed for nausea. 09/10/19   Gareth Morgan, MD  pantoprazole (PROTONIX) 40 MG tablet Take 40 mg by mouth daily.    [provider]  polyethylene glycol powder (GLYCOLAX/MIRALAX) powder Take 17 g by mouth daily. Patient taking differently: Take 17 g by mouth daily as needed for mild constipation.  05/08/18   Shirley, Martin[place], DO  potassium chloride SA (KLOR-CON) 20 MEQ tablet Take 1 tablet (20 mEq total) by mouth 2 (two) times daily. Needs appt for further refills 09/06/19   Bensimhon, Shaune Pascal, MD  rOPINIRole (REQUIP) 1 MG tablet Take 1 tablet (1 mg total) by mouth daily. Patient taking differently: Take 1 mg by mouth at bedtime.  12/24/13   Collene Gobble, MD  rosuvastatin (CRESTOR) 10 MG tablet Take 10 mg by mouth at bedtime.  08/08/16   [provider]  spironolactone (ALDACTONE) 25 MG tablet Take 1 tablet (25 mg total) by mouth daily. 02/16/16   Shirley Friar, PA-C  torsemide (DEMADEX) 20 MG tablet TAKE 2 TABLETS BY MOUTH  DAILY 12/21/18   Bensimhon, Shaune Pascal, MD  traMADol-acetaminophen (ULTRACET) 37.5-325 MG tablet Take 2 tablets by mouth 2 (two) times daily.    [provider]  traZODone (DESYREL) 50 MG tablet Take 12.5 mg by mouth at bedtime.     [provider]    Allergies    Meloxicam and Vancomycin  Review of Systems   Review of Systems  Constitutional: Positive for activity change, appetite change and unexpected weight change. Negative for fever.  HENT: Negative for sore throat.   Eyes: Negative for visual disturbance.  Respiratory: Negative for cough (reports some chronic) and shortness of breath (chronic unchanged).   Cardiovascular: Positive for chest pain.  Gastrointestinal: Positive for abdominal pain, diarrhea, nausea and vomiting.  Genitourinary: Negative for difficulty urinating.  Musculoskeletal: Positive for back pain. Negative for neck stiffness.  Skin: Negative for rash.  Neurological: Negative for syncope and headaches.    Physical Exam Updated Vital  Signs BP (!) 153/82 (BP Location: Right Arm)   Pulse 75   Temp 98 F (36.7 C) (Oral)   Resp 20   Ht 5\' 10"  (1.778 m)   Wt 86.2 kg   SpO2 97%   BMI 27.26 kg/m   Physical Exam Vitals and nursing note reviewed.  Constitutional:      General: He is not in acute distress.    Appearance: He is well-developed. He is ill-appearing (anxious). He is not diaphoretic.  HENT:     Head: Normocephalic and atraumatic.  Eyes:     Conjunctiva/sclera: Conjunctivae normal.  Cardiovascular:     Rate and Rhythm: Normal rate and regular rhythm.     Heart sounds: Normal heart sounds. No murmur. No friction rub. No gallop.   Pulmonary:     Effort: Pulmonary effort is normal. Tachypnea (reports pain) present. No respiratory distress.     Breath sounds: Wheezing present. No rales.  Abdominal:     General: There is no distension.     Palpations: Abdomen is soft.     Tenderness: There is abdominal tenderness (epigastric, RUQ, LUQ, neg murphy's). There is no guarding.  Musculoskeletal:     Cervical back: Normal range of motion.  Skin:    General: Skin is warm and dry.  Neurological:     Mental Status: He is alert and oriented to person, place, and time.     ED Results / Procedures / Treatments   Labs (all labs ordered are listed, but only abnormal results are displayed) Labs Reviewed  BASIC METABOLIC PANEL - Abnormal; Notable for the following components:      Result Value   Glucose, Bld 114 (*)    Creatinine, Ser 1.53 (*)    GFR calc non Af Amer 45 (*)    GFR calc Af  Amer 53 (*)    All other components within normal limits  CBC - Abnormal; Notable for the following components:   WBC 11.3 (*)    All other components within normal limits  BRAIN NATRIURETIC PEPTIDE - Abnormal; Notable for the following components:   B Natriuretic Peptide 238.3 (*)    All other components within normal limits  HEPATIC FUNCTION PANEL - Abnormal; Notable for the following components:   Total Protein 8.4 (*)     Total Bilirubin 1.6 (*)    Bilirubin, Direct 0.3 (*)    Indirect Bilirubin 1.3 (*)    All other components within normal limits  TROPONIN I (HIGH SENSITIVITY) - Abnormal; Notable for the following components:   Troponin I (High Sensitivity) 24 (*)    All other components within normal limits  TROPONIN I (HIGH SENSITIVITY) - Abnormal; Notable for the following components:   Troponin I (High Sensitivity) 23 (*)    All other components within normal limits  SARS CORONAVIRUS 2 BY RT PCR [facility] ORDER, [place] LAB)  LIPASE, BLOOD    EKG EKG Interpretation  Date/Time:  Thursday Sep 09 2019 19:58:00 EDT Ventricular Rate:  77 PR Interval:  160 QRS Duration: 170 QT Interval:  445 QTC Calculation: 504 R Axis:   -78 Text Interpretation: Sinus rhythm Borderline prolonged PR interval Left bundle branch block Since last tracing pacer spikes no longer present Confirmed by Calvert Cantor [phone]) on 09/10/2019 10:00:56 AM   Radiology DG Chest Port 1 View  Result Date: 09/09/2019 CLINICAL DATA:  Chest pain, shortness of breath. EXAM: PORTABLE CHEST 1 VIEW COMPARISON:  March 31, 2019. FINDINGS: The heart size and mediastinal contours are within normal limits. No pneumothorax or pleural effusion noted. Left-sided pacemaker is unchanged in position. Both lungs are clear. The visualized skeletal structures are unremarkable. IMPRESSION: No active disease. Electronically Signed   By: Marijo Conception M.D.   On: 09/09/2019 20:12   CT Angio Chest/Abd/Pel for Dissection W and/or Wo Contrast  Result Date: 09/09/2019 CLINICAL DATA:  Chest and back pain with shortness of breath EXAM: CT ANGIOGRAPHY CHEST, ABDOMEN AND PELVIS TECHNIQUE: Non-contrast CT of the chest was initially obtained. Multidetector CT imaging through the chest, abdomen and pelvis was performed using the standard protocol during bolus administration of intravenous contrast. Multiplanar reconstructed images and  MIPs were obtained and reviewed to evaluate the vascular anatomy. CONTRAST:  145mL OMNIPAQUE IOHEXOL 350 MG/ML SOLN COMPARISON:  04/16/2019, 02/02/2016 FINDINGS: CTA CHEST FINDINGS Cardiovascular: Thoracic aorta demonstrates atherosclerotic calcifications and mild dilatation of the ascending aorta. No hyperdense crescent to suggest acute dissection or aortic injury is noted. Mild prominence of the ascending aorta 3.8 cm is noted slightly smaller than that seen in 2017. No evidence of dissection is noted. Aortic valve replacement is seen. Descending thoracic aorta demonstrates atherosclerotic calcifications without aneurysmal dilatation. No cardiac enlargement is seen. Coronary calcifications are noted as well as prior pacemaker placement Mediastinum/Nodes: Thoracic inlet is within normal limits. Small mediastinal and hilar lymph nodes are noted but not significant by size criteria. The esophagus as visualized is within normal limits. Lungs/Pleura: Mild emphysematous changes are noted. There is a small 5 mm nodule noted along the major fissure on the right on image number 38 of series 6. No other definitive nodules are seen. No focal infiltrate or sizable effusion is noted. No pneumothorax is seen. Musculoskeletal: Degenerative changes of the thoracic spine are noted. No acute rib abnormality is seen. Review of the MIP  images confirms the above findings. CTA ABDOMEN AND PELVIS FINDINGS VASCULAR Aorta: Abdominal aorta demonstrates atherosclerotic calcifications. Very minimal dilatation to 2.4 cm is noted in the infrarenal segment. No dissection or focal stenosis is seen. Celiac: Patent without evidence of aneurysm, dissection, vasculitis or significant stenosis. SMA: Mild atherosclerotic calcifications are noted. No focal stenosis is seen. Renals: Single renal artery is noted on the left which is widely patent. A diminutive right renal artery is noted as well as a smaller branch vessel. IMA: Patent without evidence of  aneurysm, dissection, vasculitis or significant stenosis. Iliacs: Atherosclerotic calcifications are noted without aneurysmal dilatation. Veins: No specific venous abnormality is noted. Review of the MIP images confirms the above findings. NON-VASCULAR Hepatobiliary: Liver is diffusely fatty infiltrated. The gallbladder is partially distended with a few small gallstones within. No biliary ductal dilatation is noted. Pancreas: Unremarkable. No pancreatic ductal dilatation or surrounding inflammatory changes. Spleen: Normal in size without focal abnormality. Adrenals/Urinary Tract: Adrenal glands are within normal limits. The left kidney again shows a small enhancing mass lesion posteriorly similar to that noted on the prior exam from 2020. It measures 1.5 cm in greatest dimension. No renal calculi or obstructive changes are noted. The right kidney is somewhat atrophic anteriorly consistent with prior infarct and cortical thinning. The perfused right kidney is otherwise within normal limits. No ureteral stones are seen. The bladder is well distended. Stomach/Bowel: Mild diverticular changes noted without evidence of diverticulitis. The colon is predominately decompressed. No inflammatory changes are seen. The appendix is within normal limits. No small bowel obstructive changes are seen. The stomach is within normal limits. Lymphatic: No significant lymphadenopathy is identified. Reproductive: Prostate is unremarkable. Other: No abdominal wall hernia or abnormality. No abdominopelvic ascites. Musculoskeletal: No acute or significant osseous findings. Review of the MIP images confirms the above findings. IMPRESSION: CTA of the chest: No evidence of acute aortic injury. Minimal dilatation to 3.8 cm is noted in the ascending portion. No dissection is noted. 5 mm nodule in the right lower lobe along the major fissure. No follow-up needed if patient is low-risk. Non-contrast chest CT can be considered in 12 months if  patient is high-risk. This recommendation follows the consensus statement: Guidelines for Management of Incidental Pulmonary Nodules Detected on CT Images: From the Fleischner Society 2017; Radiology 2017; 284:228-243. CTA of the abdomen and pelvis: Atherosclerotic calcifications of the aorta without aneurysmal dilatation or dissection. Dual renal arteries on the right with a diminutive vessel similar to that seen on prior exam is with renal atrophy secondary to renal artery stenosis. This is stable from previous exams. Diverticulosis without diverticulitis. Fatty liver. Stable enhancing lesion in the posterior aspect of the left kidney likely representing a small renal cell carcinoma. This is stable from the prior exam from 2020. Electronically Signed   By: Inez Catalina M.D.   On: 09/09/2019 20:48    Procedures Procedures (including critical care time)  Medications Ordered in ED Medications  morphine 4 MG/ML injection 4 mg (4 mg Intravenous Given 09/09/19 2013)  iohexol (OMNIPAQUE) 350 MG/ML injection 100 mL (100 mLs Intravenous Contrast Given 09/09/19 2022)  metoCLOPramide (REGLAN) injection 10 mg (10 mg Intravenous Given 09/09/19 2316)  diphenhydrAMINE (BENADRYL) injection 25 mg (25 mg Intravenous Given 09/09/19 2316)  pantoprazole (PROTONIX) injection 40 mg (40 mg Intravenous Given 09/09/19 2315)  alum & mag hydroxide-simeth (MAALOX/MYLANTA) 200-200-20 MG/5ML suspension 30 mL (30 mLs Oral Given 09/09/19 2314)    And  lidocaine (XYLOCAINE) 2 % viscous mouth solution 15  mL (15 mLs Oral Given 09/09/19 2314)    ED Course  I have reviewed the triage vital signs and the nursing notes.  Pertinent labs & imaging results that were available during my care of the patient were reviewed by me and considered in my medical decision making (see chart for details).    MDM Rules/Calculators/A&P                       69yo male with history of CHF, atrial fibrillation, diabetes, hypertension, aortic valve  replacement, pacemaker who presents with concern for nausea, vomiting, abdominal pain, diarrhea, with chest pain developing in route to the emergency department.  Given severe pain in chest and abdomen on arrival, CT dissection study ordered. No sign of dissection, cholecystitis or other acute abnormalities. Discussed incidental findings in detail.  Chest pain resolved, troponin stable at 24, 23. ECG with paced rhythm, similar findings in V6 with questionable new changes in V5 however feel this is less likely [place] and appears to be secondary to wandering baseline and in clinical setting of primary abdominal pain, brief episode of atypical right sided CP, and stable troponins I doubt ACS. Denies dyspnea, primary abdominal pain/n/v/diarrhea, doubt PE. No sign of pneumonia.   No sign of cholecystitis on CT, does have gallstones. While epigastric tenderness present, pain primarily lower abdomen and doubt this episode of pain represents symptomatic cholelithiasis.  No acute hepatic lab changes. No sign of pancreatitis.  Given episodes of pain in the past at times associated with eating and gallstones will give number for surgery for evaluation.  In addition, will give number for GI given abdominal pain, decreased tolerance of po intake, nausea, for which he was previously unable to follow up with given pandemic.    Suspect symptoms with this episode of abdominal pain consistent with viral gastroenteritis with esophageal etiology of brief episode of CP that resolved.  Symptoms improved with reglan/benadyrl. Pt able to tolerate po fluids. Given rx for zofran. Patient discharged in stable condition with understanding of reasons to return.      Final Clinical Impression(s) / ED Diagnoses Final diagnoses:  Nausea vomiting and diarrhea  Chest pain, unspecified type    Rx / DC Orders ED Discharge Orders         Ordered    ondansetron (ZOFRAN ODT) 4 MG disintegrating tablet  Every 8 hours PRN      09/10/19 0015           Gareth Morgan, MD 09/10/19 1430

## 2019-09-09 NOTE — ED Notes (Signed)
Attempted IV in R AC x1 without success.

## 2019-09-09 NOTE — ED Notes (Signed)
Called  [phone] for cardiology consult 9:46

## 2019-09-09 NOTE — ED Notes (Signed)
Called [phone] for consult with Stemi  8:01pm

## 2019-09-10 MED ORDER — ONDANSETRON 4 MG PO TBDP: 4.0000 mg | ORAL_TABLET | Freq: Three times a day (TID) | ORAL | 0 refills | Status: DC | PRN

## 2019-09-14 ENCOUNTER — Telehealth (HOSPITAL_COMMUNITY): Payer: Self-pay | Admitting: *Deleted

## 2019-09-14 NOTE — Telephone Encounter (Signed)
pts wife left VM stating pt was seen in the ED recently and she thinks pt needs to be seen by Dr.Bensimhon. After reviewing provider ED notes pt does not need immediate follow up with our clinic as this was not a cardiac issue. Pt is due for regular follow up. I called wife back no answer/left detailed message and ask that she return call to make a regular next available follow up with Dr.Bensimhon  ED note:  "Suspect symptoms with this episode of abdominal pain consistent with viral gastroenteritis with esophageal etiology of brief episode of CP that resolved.  Symptoms improved with reglan/benadyrl. Pt able to tolerate po fluids. Given rx for zofran. Patient discharged in stable condition with understanding of reasons to return. " -Schlossman,Erin,MD

## 2019-09-23 ENCOUNTER — Telehealth: Payer: Self-pay | Admitting: *Deleted

## 2019-09-23 ENCOUNTER — Other Ambulatory Visit: Payer: Self-pay | Admitting: Interventional Radiology

## 2019-09-23 DIAGNOSIS — N2889 Other specified disorders of kidney and ureter: Secondary | ICD-10-CM

## 2019-09-23 NOTE — Telephone Encounter (Signed)
   [facility] Pre-operative Risk Assessment    HEARTCARE STAFF: - Please ensure there is not already an duplicate clearance open for this procedure. - Under Visit Info/Reason for Call, type in Other and utilize the format Clearance MM/DD/YY or Clearance TBD. Do not use dashes or single digits. - If request is for dental extraction, please clarify the # of teeth to be extracted.  Request for surgical clearance:  1. What type of surgery is being performed? COLONOSCOPY/ENDOSCOPY   2. When is this surgery scheduled? TBD   3. What type of clearance is required (medical clearance vs. Pharmacy clearance to hold med vs. Both)? BOTH  4. Are there any medications that need to be held prior to surgery and how long? ELIQUIS   5. Practice name and name of physician performing surgery? [facility]; LEFT MESSAGE TO CALL BACK WITH NAME OF MD FOR PROCEDURE  6. What is the office phone number? [phone]   7.   What is the office fax number? [phone]  8.   Anesthesia type (None, local, MAC, general) ? NOT LISTED; LEFT MESSAGE TO CONFIRM WHAT ANESTHESIA WILL BE USED.    Julaine Hua 09/23/2019, 4:17 PM  _________________________________________________________________   (provider comments below)

## 2019-09-24 NOTE — Telephone Encounter (Signed)
[facility] is returning Carol's call to follow up in regards to anesthesia type and name of physician performing the procedure. She states the physician's name is Dr. Arta Silence and the type of anethesia used will be propofol.

## 2019-09-24 NOTE — Telephone Encounter (Signed)
Patient with diagnosis of ATRIAL FIBRILLATION on ELIQUIS for anticoagulation.    Procedure: COLONOSCOPY/ENDOSCOPY Date of procedure: TBD  CHADS2-VASc score of  5 (CHF, HTN, AGE, DM2, CAD)  *no Hx for VTE/CVA/Stroke NOTED*  CrCl 54 ML/MIN   Per office protocol, patient can hold ELIQUIS for 2 days prior to procedure.

## 2019-09-28 ENCOUNTER — Other Ambulatory Visit: Payer: Self-pay

## 2019-09-28 DIAGNOSIS — N2889 Other specified disorders of kidney and ureter: Secondary | ICD-10-CM

## 2019-09-28 NOTE — Telephone Encounter (Signed)
Spoke with his wife and discussed recommendation by our clinical pharmacist. Unfortunately his wife is outside and I was unable to talk to the patient. I plan to call him back tomorrow

## 2019-09-29 NOTE — Telephone Encounter (Signed)
   Primary Cardiologist: Dr. Haroldine Laws / Dr. Rayann Heman  Chart reviewed as part of pre-operative protocol coverage. Patient was contacted 09/29/2019 in reference to pre-operative risk assessment for pending surgery as outlined below.  [name] was last seen on 06/15/2019 by Roderic Palau.  Since that day, CHRISTOPHERMICH SENDEJO has done well without chest pain and palpitation.  Therefore, based on ACC/AHA guidelines, the patient would be at acceptable risk for the planned procedure without further cardiovascular testing.   I will route this recommendation to the requesting party via Epic fax function and remove from pre-op pool. Please call with questions.  He is aware to hold Eliquis prior to the procedure and restart as soon as possible after the procedure at the surgeon's discretion.   [place], [place] 09/29/2019, 1:29 PM

## 2019-10-05 ENCOUNTER — Other Ambulatory Visit: Payer: Self-pay

## 2019-10-05 ENCOUNTER — Other Ambulatory Visit: Payer: Self-pay | Admitting: Interventional Radiology

## 2019-10-05 ENCOUNTER — Ambulatory Visit (HOSPITAL_COMMUNITY)
Admission: RE | Admit: 2019-10-05 | Discharge: 2019-10-05 | Disposition: A | Discharge status: Home / Self Care | Payer: Medicare Other | Source: Ambulatory Visit | Attending: Interventional Radiology | Admitting: Interventional Radiology

## 2019-10-05 DIAGNOSIS — N2889 Other specified disorders of kidney and ureter: Secondary | ICD-10-CM

## 2019-10-05 LAB — POCT I-STAT CREATININE: Creatinine, Ser: 2.1 mg/dL — ABNORMAL HIGH (ref 0.61–1.24)

## 2019-10-05 MED ORDER — SODIUM CHLORIDE (PF) 0.9 % IJ SOLN
INTRAMUSCULAR | Status: AC
  Filled 2019-10-05: qty 50

## 2019-10-05 MED ORDER — IOHEXOL 300 MG/ML  SOLN
80.0000 mL | Freq: Once | INTRAMUSCULAR | Status: AC | PRN
  Administered 2019-10-05: 80 mL via INTRAVENOUS

## 2019-10-06 ENCOUNTER — Encounter: Payer: Self-pay | Admitting: *Deleted

## 2019-10-06 ENCOUNTER — Ambulatory Visit
Admission: RE | Admit: 2019-10-06 | Discharge: 2019-10-06 | Disposition: A | Discharge status: Home / Self Care | Payer: Medicare Other | Source: Ambulatory Visit | Attending: Interventional Radiology | Admitting: Interventional Radiology

## 2019-10-06 DIAGNOSIS — N2889 Other specified disorders of kidney and ureter: Secondary | ICD-10-CM

## 2019-10-06 HISTORY — PX: IR RADIOLOGIST EVAL & MGMT: IMG5224

## 2019-10-06 NOTE — Progress Notes (Addendum)
Chief Complaint: Patient was consulted remotely today (TeleHealth) for follow up of a left renal mass.  History of Present Illness: [name] is a 69 y.o. male originally seen in consultation for a partially cystic and partially enhancing 1.5 cm left renal mass on 05/06/2019.  At that time, imaging review showed no significant growth of the lesion in approximately 11 months and given history of chronic kidney disease it was decided to follow the lesion further and have him evaluated by Nephrology given that he may likely need IV contrast at the time of ablation in order to accurately localize the lesion as it is entirely endophytic. He saw Dr. Corliss Parish with [facility] on 09/03/2019.  He has had symptoms of nausea, vomiting, abdominal pain and diarrhea in May. Symptoms have improved slightly but have been persistent. EGD and colonoscopy scheduled for July 28 with Dr. Paulita Fujita.   Follow up CT of the abdomen was scheduled yesterday but could not be performed with IV contrast due to an I-STAT creatinine of 2.1 which is above his baseline that is closer to 1.6. A contrast enhanced CTA of the chest, abdomen and pelvis was performed on 09/09/2019 through the ED at the time of work up of acute abdominal and GI complaints.  Past Medical History:  Diagnosis Date  . Aortic stenosis, mild   . Arthritis of hand    "just a little bit in both hands" (03/31/2012)  . Asthma    "little bit" (03/31/2012)  . Atrial fibrillation [facility])    dx '04; DCCV '04, placed on flecainide, failed DCCV 04/2010, flecainide stopped; s/p successful A.fib ablation 01/31/12  . CHF (congestive heart failure) ([place])   . Diabetes mellitus without complication (HCC)    borderline  . DJD (degenerative joint disease)   . Exertional dyspnea   . Fibromyalgia   . First degree AV block    post ablation, ~483ms  . GERD (gastroesophageal reflux disease)   . Heart murmur   . Hyperlipidemia   . Hypertension   .  Hypothyroidism    S/P radiation  . Left atrial enlargement    LA size 61mm by echo 11/21/11  . Mitral regurgitation    trivial  . Obesity   . Obstructive sleep apnea    mild by sleep study 2013; pt stated he does not have a machine because "it wasn't bad enough for him to have one"  . Pacemaker 03/31/2012  . Restless leg syndrome   . Second degree AV block   . Visit for monitoring Tikosyn therapy 04/16/2016    Past Surgical History:  Procedure Laterality Date  . AORTIC VALVE REPLACEMENT N/A 08/22/2015   Procedure: AORTIC VALVE REPLACEMENT (AVR);  Surgeon: Ivin Poot, MD;  Location: [place];  Service: Open Heart Surgery;  Laterality: N/A;  . ATRIAL FIBRILLATION ABLATION  01/30/2012   PVI by Dr. Rayann Heman  . ATRIAL FIBRILLATION ABLATION N/A 01/31/2012   Procedure: ATRIAL FIBRILLATION ABLATION;  Surgeon: Thompson Grayer, MD;  Location: [facility] CATH LAB;  Service: Cardiovascular;  Laterality: N/A;  . BACK SURGERY     X 3  . CARDIAC CATHETERIZATION N/A 08/09/2015   Procedure: Right/Left Heart Cath and Coronary Angiography;  Surgeon: Peter M Martinique, MD;  Location: [place] CV LAB;  Service: Cardiovascular;  Laterality: N/A;  . CARDIAC CATHETERIZATION N/A 02/05/2016   Procedure: Right/Left Heart Cath and Coronary Angiography;  Surgeon: Jettie Booze, MD;  Location: [place] CV LAB;  Service: Cardiovascular;  Laterality: N/A;  .  CARDIAC CATHETERIZATION N/A 04/17/2016   Procedure: Right Heart Cath;  Surgeon: Jolaine Artist, MD;  Location: [place] CV LAB;  Service: Cardiovascular;  Laterality: N/A;  . CARDIOVASCULAR STRESS TEST  03/12/2002   EF 48%, NO EVIDENCE OF ISCHEMIA  . CARDIOVERSION  01/2012; 03/31/2012  . CARDIOVERSION N/A 02/25/2012   Procedure: CARDIOVERSION;  Surgeon: Thompson Grayer, MD;  Location: [facility] CATH LAB;  Service: Cardiovascular;  Laterality: N/A;  . CARDIOVERSION N/A 03/31/2012   Procedure: CARDIOVERSION;  Surgeon: Thompson Grayer, MD;  Location: [facility] CATH LAB;  Service:  Cardiovascular;  Laterality: N/A;  . CARDIOVERSION N/A 11/23/2015   Procedure: CARDIOVERSION;  Surgeon: Larey Dresser, MD;  Location: [facility] ENDOSCOPY;  Service: Cardiovascular;  Laterality: N/A;  . CARDIOVERSION N/A 04/08/2016   Procedure: CARDIOVERSION;  Surgeon: Jolaine Artist, MD;  Location: [facility] ENDOSCOPY;  Service: Cardiovascular;  Laterality: N/A;  . CARDIOVERSION N/A 09/14/2018   Procedure: CARDIOVERSION;  Surgeon: Josue Hector, MD;  Location: [place];  Service: Cardiovascular;  Laterality: N/A;  . CLIPPING OF ATRIAL APPENDAGE N/A 08/22/2015   Procedure: CLIPPING OF ATRIAL APPENDAGE;  Surgeon: Ivin Poot, MD;  Location: [place];  Service: Open Heart Surgery;  Laterality: N/A;  . DOPPLER ECHOCARDIOGRAPHY  03/11/2002   EF 70-75%  . FINGER SURGERY Left    Middle finger  . FINGER TENDON REPAIR  1980's   "right little finger" (03/31/2012)  . INSERT / REPLACE / REMOVE PACEMAKER     [employer]  . IR RADIOLOGIST EVAL & MGMT  05/06/2019  . LEFT AND RIGHT HEART CATHETERIZATION WITH CORONARY ANGIOGRAM N/A 10/27/2012   Procedure: LEFT AND RIGHT HEART CATHETERIZATION WITH CORONARY ANGIOGRAM;  Surgeon: Laverda Page, MD;  Location: [facility] CATH LAB;  Service: Cardiovascular;  Laterality: N/A;  . LUMBAR DISC SURGERY  1980's X2;  2000's  . MAZE N/A 08/22/2015   Procedure: MAZE;  Surgeon: Ivin Poot, MD;  Location: [place];  Service: Open Heart Surgery;  Laterality: N/A;  . PACEMAKER INSERTION  03/31/2012   STJ Accent DR pacemaker implanted by Dr Rayann Heman  . PERMANENT PACEMAKER INSERTION N/A 03/31/2012   Procedure: PERMANENT PACEMAKER INSERTION;  Surgeon: Thompson Grayer, MD;  Location: [facility] CATH LAB;  Service: Cardiovascular;  Laterality: N/A;  . TEE WITHOUT CARDIOVERSION  01/30/2012   Procedure: TRANSESOPHAGEAL ECHOCARDIOGRAM (TEE);  Surgeon: Larey Dresser, MD;  Location: [place];  Service: Cardiovascular;  Laterality: N/A;  ablation next day  . TEE WITHOUT CARDIOVERSION N/A 08/22/2015    Procedure: TRANSESOPHAGEAL ECHOCARDIOGRAM (TEE);  Surgeon: Ivin Poot, MD;  Location: [place];  Service: Open Heart Surgery;  Laterality: N/A;  . US ECHOCARDIOGRAPHY  08/07/2009   EF 55-60%    Allergies: Meloxicam and Vancomycin  Medications: Prior to Admission medications   Medication Sig Start Date End Date Taking? Authorizing Provider  albuterol (PROVENTIL HFA;VENTOLIN HFA) 108 (90 Base) MCG/ACT inhaler Inhale 2 puffs into the lungs every 6 (six) hours as needed for wheezing or shortness of breath. 12/28/15   Collene Gobble, MD  amLODipine (NORVASC) 10 MG tablet Take 0.5 tablets (5 mg total) by mouth daily. 09/18/15   Nahser, Wonda Cheng, MD  apixaban (ELIQUIS) 5 MG TABS tablet TAKE 1 TABLET(5 MG) BY MOUTH TWICE DAILY 02/25/19   Bensimhon, Shaune Pascal, MD  budesonide-formoterol [facility]) 160-4.5 MCG/ACT inhaler Inhale 2 puffs into the lungs 2 (two) times daily. 11/13/15   Collene Gobble, MD  Cholecalciferol (VITAMIN D) 2000 UNITS CAPS Take 8,000 Units by mouth at  bedtime.     [provider]  dofetilide (TIKOSYN) 250 MCG capsule Take 1 capsule (250 mcg total) by mouth 2 (two) times daily. 06/08/19   Sherran Needs, NP  fexofenadine (ALLEGRA) 180 MG tablet Take 1 tablet (180 mg total) by mouth daily. 03/01/14   Collene Gobble, MD  gabapentin (NEURONTIN) 300 MG capsule Take 600-1,200 mg by mouth See admin instructions. Take 600 mg by mouth in the morning, 600 mg at lunchtime, and 1,200 mg at bedtime 09/13/14   [provider]  guaiFENesin (MUCINEX) 600 MG 12 hr tablet Take 1,200 mg by mouth 2 (two) times daily.     [provider]  HYDROcodone-acetaminophen (NORCO/VICODIN) 5-325 MG tablet Take 1 tablet by mouth at bedtime as needed for moderate pain.    [provider]  levothyroxine (SYNTHROID, LEVOTHROID) 175 MCG tablet Take 175 mcg by mouth daily before breakfast.    [provider]  MAGNESIUM PO Take 1 tablet by mouth at bedtime.    [provider]  metFORMIN (GLUCOPHAGE-XR) 500 MG 24 hr tablet Take 500 mg by mouth at bedtime.    [provider]  metoprolol tartrate (LOPRESSOR) 50 MG tablet TAKE 1 TABLET BY MOUTH 2  TIMES DAILY. 06/30/19   Larey Dresser, MD  ondansetron (ZOFRAN ODT) 4 MG disintegrating tablet Take 1 tablet (4 mg total) by mouth every 8 (eight) hours as needed for nausea. 09/10/19   Gareth Morgan, MD  pantoprazole (PROTONIX) 40 MG tablet Take 40 mg by mouth daily.    [provider]  polyethylene glycol powder (GLYCOLAX/MIRALAX) powder Take 17 g by mouth daily. Patient taking differently: Take 17 g by mouth daily as needed for mild constipation.  05/08/18   Shirley, Martinique, DO  potassium chloride SA (KLOR-CON) 20 MEQ tablet Take 1 tablet (20 mEq total) by mouth 2 (two) times daily. Needs appt for further refills 09/06/19   Bensimhon, Shaune Pascal, MD  rOPINIRole (REQUIP) 1 MG tablet Take 1 tablet (1 mg total) by mouth daily. Patient taking differently: Take 1 mg by mouth at bedtime.  12/24/13   Collene Gobble, MD  rosuvastatin (CRESTOR) 10 MG tablet Take 10 mg by mouth at bedtime.  08/08/16   [provider]  spironolactone (ALDACTONE) 25 MG tablet Take 1 tablet (25 mg total) by mouth daily. 02/16/16   Shirley Friar, PA-C  torsemide (DEMADEX) 20 MG tablet TAKE 2 TABLETS BY MOUTH  DAILY 12/21/18   Bensimhon, Shaune Pascal, MD  traMADol-acetaminophen (ULTRACET) 37.5-325 MG tablet Take 2 tablets by mouth 2 (two) times daily.    [provider]  traZODone (DESYREL) 50 MG tablet Take 12.5 mg by mouth at bedtime.     [provider]     Family History  Problem Relation Age of Onset  . Heart failure Mother   . Stroke Father     Social History   Socioeconomic History  . Marital status: Married    Spouse name: Not on file  . Number of children: Not on file  . Years of education: Not on file  . Highest education level: Not on file  Occupational History  . Not on  file  Tobacco Use  . Smoking status: Former Smoker    Packs/day: 0.50    Years: 15.00    Pack years: 7.50    Types: Cigarettes    Quit date: 07/18/1991    Years since quitting: 28.2  . Smokeless tobacco: Never Used  Substance  and Sexual Activity  . Alcohol use: Yes    Comment: occasional  . Drug use: Yes    Types: Marijuana    Comment: 02/2019  . Sexual activity: Not Currently  Other Topics Concern  . Not on file  Social History Narrative   Lives in [place] with spouse.  Works as a Air traffic controller.   Social Determinants of Health   Financial Resource Strain:   . Difficulty of Paying Living Expenses:   Food Insecurity:   . Worried About Charity fundraiser in the Last Year:   . Arboriculturist in the Last Year:   Transportation Needs:   . Film/video editor (Medical):   [name] Lack of Transportation (Non-Medical):   Physical Activity:   . Days of Exercise per Week:   . Minutes of Exercise per Session:   Stress:   . Feeling of Stress :   Social Connections:   . Frequency of Communication with Friends and Family:   . Frequency of Social Gatherings with Friends and Family:   . Attends Religious Services:   . Active Member of Clubs or Organizations:   . Attends Archivist Meetings:   [name] Marital Status:      Review of Systems  Constitutional: Negative.   Respiratory: Negative.   Cardiovascular: Negative.   Gastrointestinal: Positive for abdominal pain, diarrhea, nausea and vomiting. Negative for blood in stool and constipation.  Genitourinary: Negative.   Musculoskeletal: Negative.   Neurological: Negative.     Review of Systems: A 12 point ROS discussed and pertinent positives are indicated in the HPI above.  All other systems are negative.  Physical Exam No direct physical exam was performed (except for noted visual exam findings with Video Visits).   Vital Signs: There were no vitals taken for this visit.  Imaging: CT ABDOMEN WO  CONTRAST  Result Date: 10/05/2019 CLINICAL DATA:  69 year old male with history of left renal mass. EXAM: CT ABDOMEN WITHOUT CONTRAST TECHNIQUE: Multidetector CT imaging of the abdomen was performed following the standard protocol without IV contrast. COMPARISON:  CTA of the chest, abdomen and pelvis 09/09/2019. FINDINGS: Lower chest: Pacemaker leads in the right atrium and right ventricle. Aortic atherosclerosis. Hepatobiliary: No discrete cystic or solid hepatic lesions are confidently identified on today's noncontrast CT examination. 3 mm calcified gallstone lying dependently in the gallbladder. Gallbladder is nearly decompressed, but otherwise unremarkable in appearance. Specifically, no evidence of acute cholecystitis at this time. Pancreas: No definite pancreatic mass or peripancreatic fluid collections or inflammatory changes noted on today's noncontrast CT examination. Spleen: Spleen is enlarged measuring 16.3 x 5.8 x 11.7 cm (estimated splenic volume of 553 mL). Adrenals/Urinary Tract: Previously noted lesion in the interpolar region of the left kidney is barely discernible on today's noncontrast CT examination, estimated to measure approximately 1.4 cm (axial image 66 of series 2). Severe atrophy in the interpolar region and lower pole of the right kidney. No hydroureteronephrosis in the visualized portions of the abdomen. Bilateral adrenal glands are normal in appearance. Stomach/Bowel: Unenhanced appearance of the stomach is normal. No pathologic dilatation of visualized portions of small bowel or colon. Visualized portions of the appendix are normal. Vascular/Lymphatic: Aortic atherosclerosis. No lymphadenopathy noted in the abdomen. Other: Small umbilical hernia containing only omental fat. No significant volume of ascites and no pneumoperitoneum noted in the visualized portions of the peritoneal cavity. Musculoskeletal: There are no aggressive appearing lytic or blastic lesions noted in the  visualized portions of the skeleton.  IMPRESSION: 1. Previously noted left renal lesion is very poorly evaluated on today's noncontrast CT examination, but estimated to measure approximately 1.4 cm in the interpolar region posteriorly. 2. Cholelithiasis without evidence of acute cholecystitis. 3. Splenomegaly. 4. Small umbilical hernia containing only omental fat. No associated bowel incarceration or obstruction at this time. 5. Aortic atherosclerosis. Electronically Signed   By: Vinnie Langton M.D.   On: 10/05/2019 16:29   DG Chest Port 1 View  Result Date: 09/09/2019 CLINICAL DATA:  Chest pain, shortness of breath. EXAM: PORTABLE CHEST 1 VIEW COMPARISON:  March 31, 2019. FINDINGS: The heart size and mediastinal contours are within normal limits. No pneumothorax or pleural effusion noted. Left-sided pacemaker is unchanged in position. Both lungs are clear. The visualized skeletal structures are unremarkable. IMPRESSION: No active disease. Electronically Signed   By: Marijo Conception M.D.   On: 09/09/2019 20:12   CT Angio Chest/Abd/Pel for Dissection W and/or Wo Contrast  Result Date: 09/09/2019 CLINICAL DATA:  Chest and back pain with shortness of breath EXAM: CT ANGIOGRAPHY CHEST, ABDOMEN AND PELVIS TECHNIQUE: Non-contrast CT of the chest was initially obtained. Multidetector CT imaging through the chest, abdomen and pelvis was performed using the standard protocol during bolus administration of intravenous contrast. Multiplanar reconstructed images and MIPs were obtained and reviewed to evaluate the vascular anatomy. CONTRAST:  173mL OMNIPAQUE IOHEXOL 350 MG/ML SOLN COMPARISON:  04/16/2019, 02/02/2016 FINDINGS: CTA CHEST FINDINGS Cardiovascular: Thoracic aorta demonstrates atherosclerotic calcifications and mild dilatation of the ascending aorta. No hyperdense crescent to suggest acute dissection or aortic injury is noted. Mild prominence of the ascending aorta 3.8 cm is noted slightly smaller than that  seen in 2017. No evidence of dissection is noted. Aortic valve replacement is seen. Descending thoracic aorta demonstrates atherosclerotic calcifications without aneurysmal dilatation. No cardiac enlargement is seen. Coronary calcifications are noted as well as prior pacemaker placement Mediastinum/Nodes: Thoracic inlet is within normal limits. Small mediastinal and hilar lymph nodes are noted but not significant by size criteria. The esophagus as visualized is within normal limits. Lungs/Pleura: Mild emphysematous changes are noted. There is a small 5 mm nodule noted along the major fissure on the right on image number 38 of series 6. No other definitive nodules are seen. No focal infiltrate or sizable effusion is noted. No pneumothorax is seen. Musculoskeletal: Degenerative changes of the thoracic spine are noted. No acute rib abnormality is seen. Review of the MIP images confirms the above findings. CTA ABDOMEN AND PELVIS FINDINGS VASCULAR Aorta: Abdominal aorta demonstrates atherosclerotic calcifications. Very minimal dilatation to 2.4 cm is noted in the infrarenal segment. No dissection or focal stenosis is seen. Celiac: Patent without evidence of aneurysm, dissection, vasculitis or significant stenosis. SMA: Mild atherosclerotic calcifications are noted. No focal stenosis is seen. Renals: Single renal artery is noted on the left which is widely patent. A diminutive right renal artery is noted as well as a smaller branch vessel. IMA: Patent without evidence of aneurysm, dissection, vasculitis or significant stenosis. Iliacs: Atherosclerotic calcifications are noted without aneurysmal dilatation. Veins: No specific venous abnormality is noted. Review of the MIP images confirms the above findings. NON-VASCULAR Hepatobiliary: Liver is diffusely fatty infiltrated. The gallbladder is partially distended with a few small gallstones within. No biliary ductal dilatation is noted. Pancreas: Unremarkable. No pancreatic  ductal dilatation or surrounding inflammatory changes. Spleen: Normal in size without focal abnormality. Adrenals/Urinary Tract: Adrenal glands are within normal limits. The left kidney again shows a small enhancing mass lesion posteriorly similar to  that noted on the prior exam from 2020. It measures 1.5 cm in greatest dimension. No renal calculi or obstructive changes are noted. The right kidney is somewhat atrophic anteriorly consistent with prior infarct and cortical thinning. The perfused right kidney is otherwise within normal limits. No ureteral stones are seen. The bladder is well distended. Stomach/Bowel: Mild diverticular changes noted without evidence of diverticulitis. The colon is predominately decompressed. No inflammatory changes are seen. The appendix is within normal limits. No small bowel obstructive changes are seen. The stomach is within normal limits. Lymphatic: No significant lymphadenopathy is identified. Reproductive: Prostate is unremarkable. Other: No abdominal wall hernia or abnormality. No abdominopelvic ascites. Musculoskeletal: No acute or significant osseous findings. Review of the MIP images confirms the above findings. IMPRESSION: CTA of the chest: No evidence of acute aortic injury. Minimal dilatation to 3.8 cm is noted in the ascending portion. No dissection is noted. 5 mm nodule in the right lower lobe along the major fissure. No follow-up needed if patient is low-risk. Non-contrast chest CT can be considered in 12 months if patient is high-risk. This recommendation follows the consensus statement: Guidelines for Management of Incidental Pulmonary Nodules Detected on CT Images: From the Fleischner Society 2017; Radiology 2017; 284:228-243. CTA of the abdomen and pelvis: Atherosclerotic calcifications of the aorta without aneurysmal dilatation or dissection. Dual renal arteries on the right with a diminutive vessel similar to that seen on prior exam is with renal atrophy secondary  to renal artery stenosis. This is stable from previous exams. Diverticulosis without diverticulitis. Fatty liver. Stable enhancing lesion in the posterior aspect of the left kidney likely representing a small renal cell carcinoma. This is stable from the prior exam from 2020. Electronically Signed   By: Inez Catalina M.D.   On: 09/09/2019 20:48    Labs:  CBC: Recent Labs    03/31/19 1841 09/09/19 1953  WBC 6.6 11.3*  HGB 13.2 15.4  HCT 40.3 46.5  PLT 153 206    COAGS: No results for input(s): INR, APTT in the last 8760 hours.  BMP: Recent Labs    03/31/19 1841 06/15/19 1217 09/09/19 1953 10/05/19 1523  NA 136 138 138  --   K 3.4* 4.6 4.3  --   CL 103 102 101  --   CO2 21* 25 24  --   GLUCOSE 131* 111* 114*  --   BUN 10 20 15   --   CALCIUM 9.6 9.5 10.1  --   CREATININE 1.28* 1.58* 1.53* 2.10*  GFRNONAA 57* 44* 45*  --   GFRAA >60 51* 53*  --     LIVER FUNCTION TESTS: Recent Labs    03/31/19 1841 09/09/19 1953  BILITOT 1.5* 1.6*  AST 22 23  ALT 18 20  ALKPHOS 64 77  PROT 7.4 8.4*  ALBUMIN 4.0 4.8     Assessment and Plan:  I spoke with Mr. Schrader and his wife over the phone.  On the 09/09/2019 CTA, the posterior interpolar left renal lesion shows no change in appearance since prior imaging with cystic component and suggestion of potential partial enhancement.  This measures approximately 1.5 cm and appears stable since 04/16/2019 and shows little change since 05/08/2018.  The unenhanced CT performed yesterday is limited for lesion evaluation but does demonstrate vague margination of the lesion which measures approximately 1.5 cm.  I told Mr. Alcorta and his wife that we can continue to follow the left renal lesion at this time given essentially little to no  growth over the last year and a half.  In the setting of chronic kidney disease and recent evidence of worsening of kidney function which may relate to a more active gastrointestinal illness, the renal lesion  certainly does not need to be addressed currently.  After work-up of the current gastrointestinal symptoms with Dr. Paulita Fujita, I recommended that Mr. Chrostowski follow-up with Dr. Moshe Cipro.    At some point, a follow-up CT of the abdomen with and without a reduced dose of IV contrast will be helpful to follow the left renal lesion for growth.  This may not need to be performed for another year given that the lesion has definitely not grown much at all in 18 months.   Electronically Signed: Azzie Roup 10/06/2019, 8:49 AM     I spent a total of 15 Minutes in remote  clinical consultation, greater than 50% of which was counseling/coordinating care for treatment of a left renal mass.    Visit type: Audio only (telephone). Audio (no video) only due to patient's lack of internet/smartphone capability. Alternative for in-person consultation at [facility], [address], [place], [place]. This visit type was conducted due to national recommendations for restrictions regarding the COVID-19 Pandemic (e.g. social distancing).  This format is felt to be most appropriate for this patient at this time.  All issues noted in this document were discussed and addressed.

## 2019-10-15 ENCOUNTER — Emergency Department (HOSPITAL_BASED_OUTPATIENT_CLINIC_OR_DEPARTMENT_OTHER): Payer: Medicare Other

## 2019-10-15 ENCOUNTER — Emergency Department (HOSPITAL_BASED_OUTPATIENT_CLINIC_OR_DEPARTMENT_OTHER)
Admission: EM | Admit: 2019-10-15 | Discharge: 2019-10-15 | Disposition: A | Discharge status: Home / Self Care | Payer: Medicare Other | Source: Home / Self Care | Attending: Emergency Medicine | Admitting: Emergency Medicine

## 2019-10-15 ENCOUNTER — Encounter (HOSPITAL_BASED_OUTPATIENT_CLINIC_OR_DEPARTMENT_OTHER): Payer: Self-pay | Admitting: Emergency Medicine

## 2019-10-15 ENCOUNTER — Other Ambulatory Visit: Payer: Self-pay

## 2019-10-15 DIAGNOSIS — R1110 Vomiting, unspecified: Secondary | ICD-10-CM

## 2019-10-15 DIAGNOSIS — R1084 Generalized abdominal pain: Secondary | ICD-10-CM | POA: Insufficient documentation

## 2019-10-15 DIAGNOSIS — E039 Hypothyroidism, unspecified: Secondary | ICD-10-CM | POA: Insufficient documentation

## 2019-10-15 DIAGNOSIS — R0789 Other chest pain: Secondary | ICD-10-CM | POA: Insufficient documentation

## 2019-10-15 DIAGNOSIS — J441 Chronic obstructive pulmonary disease with (acute) exacerbation: Secondary | ICD-10-CM | POA: Insufficient documentation

## 2019-10-15 DIAGNOSIS — Z87891 Personal history of nicotine dependence: Secondary | ICD-10-CM | POA: Insufficient documentation

## 2019-10-15 DIAGNOSIS — E119 Type 2 diabetes mellitus without complications: Secondary | ICD-10-CM | POA: Insufficient documentation

## 2019-10-15 DIAGNOSIS — K910 Vomiting following gastrointestinal surgery: Secondary | ICD-10-CM | POA: Insufficient documentation

## 2019-10-15 DIAGNOSIS — Z7984 Long term (current) use of oral hypoglycemic drugs: Secondary | ICD-10-CM | POA: Insufficient documentation

## 2019-10-15 DIAGNOSIS — R0602 Shortness of breath: Secondary | ICD-10-CM

## 2019-10-15 DIAGNOSIS — J45909 Unspecified asthma, uncomplicated: Secondary | ICD-10-CM | POA: Insufficient documentation

## 2019-10-15 DIAGNOSIS — I2510 Atherosclerotic heart disease of native coronary artery without angina pectoris: Secondary | ICD-10-CM | POA: Insufficient documentation

## 2019-10-15 DIAGNOSIS — M791 Myalgia, unspecified site: Secondary | ICD-10-CM

## 2019-10-15 DIAGNOSIS — Z79899 Other long term (current) drug therapy: Secondary | ICD-10-CM | POA: Insufficient documentation

## 2019-10-15 DIAGNOSIS — I110 Hypertensive heart disease with heart failure: Secondary | ICD-10-CM | POA: Insufficient documentation

## 2019-10-15 DIAGNOSIS — I509 Heart failure, unspecified: Secondary | ICD-10-CM | POA: Insufficient documentation

## 2019-10-15 DIAGNOSIS — J9621 Acute and chronic respiratory failure with hypoxia: Secondary | ICD-10-CM | POA: Diagnosis not present

## 2019-10-15 DIAGNOSIS — J45901 Unspecified asthma with (acute) exacerbation: Secondary | ICD-10-CM

## 2019-10-15 HISTORY — DX: Splenomegaly, not elsewhere classified: R16.1

## 2019-10-15 LAB — COMPREHENSIVE METABOLIC PANEL
ALT: 22 U/L (ref 0–44)
AST: 21 U/L (ref 15–41)
Albumin: 4.2 g/dL (ref 3.5–5.0)
Alkaline Phosphatase: 78 U/L (ref 38–126)
Anion gap: 10 (ref 5–15)
BUN: 18 mg/dL (ref 8–23)
CO2: 25 mmol/L (ref 22–32)
Calcium: 9.4 mg/dL (ref 8.9–10.3)
Chloride: 100 mmol/L (ref 98–111)
Creatinine, Ser: 1.38 mg/dL — ABNORMAL HIGH (ref 0.61–1.24)
GFR calc Af Amer: 60 mL/min — ABNORMAL LOW (ref >60)
GFR calc non Af Amer: 51 mL/min — ABNORMAL LOW (ref >60)
Glucose, Bld: 133 mg/dL — ABNORMAL HIGH (ref 70–99)
Potassium: 4.5 mmol/L (ref 3.5–5.1)
Sodium: 135 mmol/L (ref 135–145)
Total Bilirubin: 1.2 mg/dL (ref 0.3–1.2)
Total Protein: 7.4 g/dL (ref 6.5–8.1)

## 2019-10-15 LAB — CBC WITH DIFFERENTIAL/PLATELET
Abs Immature Granulocytes: 0.03 10*3/uL (ref 0.00–0.07)
Basophils Absolute: 0 10*3/uL (ref 0.0–0.1)
Basophils Relative: 0 %
Eosinophils Absolute: 0 10*3/uL (ref 0.0–0.5)
Eosinophils Relative: 0 %
HCT: 40.8 % (ref 39.0–52.0)
Hemoglobin: 13.2 g/dL (ref 13.0–17.0)
Immature Granulocytes: 0 %
Lymphocytes Relative: 3 %
Lymphs Abs: 0.3 10*3/uL — ABNORMAL LOW (ref 0.7–4.0)
MCH: 29.8 pg (ref 26.0–34.0)
MCHC: 32.4 g/dL (ref 30.0–36.0)
MCV: 92.1 fL (ref 80.0–100.0)
Monocytes Absolute: 0.9 10*3/uL (ref 0.1–1.0)
Monocytes Relative: 9 %
Neutro Abs: 8.7 10*3/uL — ABNORMAL HIGH (ref 1.7–7.7)
Neutrophils Relative %: 88 %
Platelets: 131 10*3/uL — ABNORMAL LOW (ref 150–400)
RBC: 4.43 MIL/uL (ref 4.22–5.81)
RDW: 13.8 % (ref 11.5–15.5)
WBC: 10 10*3/uL (ref 4.0–10.5)
nRBC: 0 % (ref 0.0–0.2)

## 2019-10-15 LAB — LIPASE, BLOOD: Lipase: 22 U/L (ref 11–51)

## 2019-10-15 LAB — BRAIN NATRIURETIC PEPTIDE: B Natriuretic Peptide: 228.2 pg/mL — ABNORMAL HIGH (ref 0.0–100.0)

## 2019-10-15 MED ORDER — IPRATROPIUM BROMIDE HFA 17 MCG/ACT IN AERS
2.0000 | INHALATION_SPRAY | Freq: Once | RESPIRATORY_TRACT | Status: AC
  Administered 2019-10-15: 2 via RESPIRATORY_TRACT
  Filled 2019-10-15: qty 12.9

## 2019-10-15 MED ORDER — AEROCHAMBER PLUS FLO-VU LARGE MISC
1.0000 | Freq: Once | Status: DC
  Filled 2019-10-15: qty 1

## 2019-10-15 MED ORDER — PREDNISONE 10 MG PO TABS: 40.0000 mg | ORAL_TABLET | Freq: Every day | ORAL | 0 refills | Status: DC

## 2019-10-15 MED ORDER — ALUM & MAG HYDROXIDE-SIMETH 200-200-20 MG/5ML PO SUSP
30.0000 mL | Freq: Once | ORAL | Status: AC
  Administered 2019-10-15: 30 mL via ORAL
  Filled 2019-10-15: qty 30

## 2019-10-15 MED ORDER — METHYLPREDNISOLONE SODIUM SUCC 125 MG IJ SOLR
125.0000 mg | Freq: Once | INTRAMUSCULAR | Status: AC
  Administered 2019-10-15: 125 mg via INTRAVENOUS
  Filled 2019-10-15: qty 2

## 2019-10-15 MED ORDER — LIDOCAINE VISCOUS HCL 2 % MT SOLN
15.0000 mL | Freq: Once | OROMUCOSAL | Status: AC
  Administered 2019-10-15: 15 mL via ORAL
  Filled 2019-10-15: qty 15

## 2019-10-15 MED ORDER — ALBUTEROL SULFATE HFA 108 (90 BASE) MCG/ACT IN AERS
6.0000 | INHALATION_SPRAY | Freq: Once | RESPIRATORY_TRACT | Status: AC
  Administered 2019-10-15: 6 via RESPIRATORY_TRACT
  Filled 2019-10-15: qty 6.7

## 2019-10-15 MED ORDER — ONDANSETRON 4 MG PO TBDP: 4.0000 mg | ORAL_TABLET | Freq: Three times a day (TID) | ORAL | 0 refills | Status: DC | PRN

## 2019-10-15 MED ORDER — METOCLOPRAMIDE HCL 5 MG/ML IJ SOLN
10.0000 mg | Freq: Once | INTRAMUSCULAR | Status: AC
  Administered 2019-10-15: 10 mg via INTRAVENOUS
  Filled 2019-10-15: qty 2

## 2019-10-15 MED ORDER — ONDANSETRON HCL 4 MG/2ML IJ SOLN
4.0000 mg | Freq: Once | INTRAMUSCULAR | Status: AC
  Administered 2019-10-15: 4 mg via INTRAVENOUS
  Filled 2019-10-15: qty 2

## 2019-10-15 MED ORDER — FENTANYL CITRATE (PF) 100 MCG/2ML IJ SOLN
50.0000 ug | INTRAMUSCULAR | Status: DC | PRN
  Administered 2019-10-15: 50 ug via INTRAVENOUS
  Filled 2019-10-15: qty 2

## 2019-10-15 MED ORDER — HYOSCYAMINE SULFATE 0.125 MG SL SUBL
0.2500 mg | SUBLINGUAL_TABLET | Freq: Once | SUBLINGUAL | Status: AC
  Administered 2019-10-15: 0.25 mg via SUBLINGUAL
  Filled 2019-10-15: qty 2

## 2019-10-15 NOTE — ED Provider Notes (Addendum)
[facility] [facility] EMERGENCY DEPARTMENT Provider Note  CSN: [id number] Arrival date & time: 10/15/19 0248  Chief Complaint(s) Abdominal Pain  HPI [name] is a 69 y.o. male with extensive past medical history listed below including asthma/emphysema on 2 L nasal cannula at home who presents to the emergency department for posttussive emesis.  Patient and wife report that he has been feeling sick all day.  States that he has had severe coughing spells prompting emesis.  Because of the severe coughs, patient is complaining of thoracic and abdominal wall pain.  No diarrhea.  Patient still passing gas.  No known fevers or chills.  Patient is coughing and throwing up clear sputum.  There are no other alleviating or aggravating factors.  HPI  Past Medical History Past Medical History:  Diagnosis Date  . Aortic stenosis, mild   . Arthritis of hand    "just a little bit in both hands" (03/31/2012)  . Asthma    "little bit" (03/31/2012)  . Atrial fibrillation [facility])    dx '04; DCCV '04, placed on flecainide, failed DCCV 04/2010, flecainide stopped; s/p successful A.fib ablation 01/31/12  . CHF (congestive heart failure) ([place])   . Diabetes mellitus without complication (HCC)    borderline  . DJD (degenerative joint disease)   . Exertional dyspnea   . Fibromyalgia   . First degree AV block    post ablation, ~414ms  . GERD (gastroesophageal reflux disease)   . Heart murmur   . Hyperlipidemia   . Hypertension   . Hypothyroidism    S/P radiation  . Left atrial enlargement    LA size 66mm by echo 11/21/11  . Mitral regurgitation    trivial  . Obesity   . Obstructive sleep apnea    mild by sleep study 2013; pt stated he does not have a machine because "it wasn't bad enough for him to have one"  . Pacemaker 03/31/2012  . Restless leg syndrome   . Second degree AV block   . Splenomegaly   . Visit for monitoring Tikosyn therapy 04/16/2016   Patient Active Problem List   Diagnosis  Date Noted  . Typical atrial flutter ([place])   . Chronic respiratory failure ([place]) 05/23/2016  . CAD (coronary artery disease) 05/23/2016  . Persistent atrial fibrillation ([place])   . Atypical atrial flutter ([place])   . Visit for monitoring Tikosyn therapy 04/16/2016  . Diastolic heart failure ([place]) 02/03/2016  . Cough 12/06/2015  . Hallucinations 09/04/2015  . Lymphadenopathy 09/04/2015  . Ascending aortic aneurysm ([place]) 09/04/2015  . Hypoxia 09/02/2015  . S/P aortic valve replacement with bioprosthetic valve 09/02/2015  . Pleural effusion 09/02/2015  . S/P AVR 08/22/2015  . GERD (gastroesophageal reflux disease) 09/20/2014  . 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 Dyspnea 09/15/2013  . Pulmonary hypertension ([place]) 09/15/2013  . Shortness of breath 05/05/2013  . 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 Bradycardia 03/31/2012  . Second degree AV block 03/31/2012  . AV block, 1st degree 02/01/2012  . PAF (paroxysmal atrial fibrillation) ([place]) 11/13/2011  . Fatigue 11/13/2011  . Hypertension 11/13/2011  . Aortic stenosis 11/13/2011  . Sleep apnea 11/13/2011   Home Medication(s) Prior to Admission medications   Medication Sig Start Date End Date Taking? Authorizing Provider  albuterol (PROVENTIL HFA;VENTOLIN HFA) 108 (90 Base) MCG/ACT inhaler Inhale 2 puffs into the lungs every 6 (six) hours as needed for wheezing or shortness of breath. 12/28/15   Collene Gobble, MD  amLODipine (NORVASC) 10 MG tablet Take 0.5 tablets (  5 mg total) by mouth daily. 09/18/15   Nahser, Wonda Cheng, MD  apixaban (ELIQUIS) 5 MG TABS tablet TAKE 1 TABLET(5 MG) BY MOUTH TWICE DAILY 02/25/19   Bensimhon, Shaune Pascal, MD  budesonide-formoterol [facility]) 160-4.5 MCG/ACT inhaler Inhale 2 puffs into the lungs 2 (two) times daily. 11/13/15   Collene Gobble, MD  Cholecalciferol (VITAMIN D) 2000 UNITS CAPS Take 8,000 Units by mouth at bedtime.     [provider]  dofetilide (TIKOSYN) 250 MCG capsule Take 1 capsule (250 mcg total)  by mouth 2 (two) times daily. 06/08/19   Sherran Needs, NP  fexofenadine (ALLEGRA) 180 MG tablet Take 1 tablet (180 mg total) by mouth daily. 03/01/14   Collene Gobble, MD  gabapentin (NEURONTIN) 300 MG capsule Take 600-1,200 mg by mouth See admin instructions. Take 600 mg by mouth in the morning, 600 mg at lunchtime, and 1,200 mg at bedtime 09/13/14   [provider]  guaiFENesin (MUCINEX) 600 MG 12 hr tablet Take 1,200 mg by mouth 2 (two) times daily.     [provider]  HYDROcodone-acetaminophen (NORCO/VICODIN) 5-325 MG tablet Take 1 tablet by mouth at bedtime as needed for moderate pain.    [provider]  levothyroxine (SYNTHROID, LEVOTHROID) 175 MCG tablet Take 175 mcg by mouth daily before breakfast.    [provider]  MAGNESIUM PO Take 1 tablet by mouth at bedtime.    [provider]  metFORMIN (GLUCOPHAGE-XR) 500 MG 24 hr tablet Take 500 mg by mouth at bedtime.    [provider]  metoprolol tartrate (LOPRESSOR) 50 MG tablet TAKE 1 TABLET BY MOUTH 2  TIMES DAILY. 06/30/19   Larey Dresser, MD  ondansetron (ZOFRAN ODT) 4 MG disintegrating tablet Take 1 tablet (4 mg total) by mouth every 8 (eight) hours as needed for nausea. 10/15/19   Fatima Blank, MD  pantoprazole (PROTONIX) 40 MG tablet Take 40 mg by mouth daily.    [provider]  polyethylene glycol powder (GLYCOLAX/MIRALAX) powder Take 17 g by mouth daily. Patient taking differently: Take 17 g by mouth daily as needed for mild constipation.  05/08/18   Shirley, [place], DO  potassium chloride SA (KLOR-CON) 20 MEQ tablet Take 1 tablet (20 mEq total) by mouth 2 (two) times daily. Needs appt for further refills 09/06/19   Bensimhon, Shaune Pascal, MD  predniSONE (DELTASONE) 10 MG tablet Take 4 tablets (40 mg total) by mouth daily for 4 days. 10/15/19 10/19/19  Fatima Blank, MD  rOPINIRole (REQUIP) 1 MG tablet Take 1 tablet (1 mg total) by mouth daily. Patient taking  differently: Take 1 mg by mouth at bedtime.  12/24/13   Collene Gobble, MD  rosuvastatin (CRESTOR) 10 MG tablet Take 10 mg by mouth at bedtime.  08/08/16   [provider]  spironolactone (ALDACTONE) 25 MG tablet Take 1 tablet (25 mg total) by mouth daily. 02/16/16   Shirley Friar, PA-C  torsemide (DEMADEX) 20 MG tablet TAKE 2 TABLETS BY MOUTH  DAILY 12/21/18   Bensimhon, Shaune Pascal, MD  traMADol-acetaminophen (ULTRACET) 37.5-325 MG tablet Take 2 tablets by mouth 2 (two) times daily.    [provider]  traZODone (DESYREL) 50 MG tablet Take 12.5 mg by mouth at bedtime.     [provider]  Past Surgical History Past Surgical History:  Procedure Laterality Date  . AORTIC VALVE REPLACEMENT N/A 08/22/2015   Procedure: AORTIC VALVE REPLACEMENT (AVR);  Surgeon: Ivin Poot, MD;  Location: [facility];  Service: Open Heart Surgery;  Laterality: N/A;  . ATRIAL FIBRILLATION ABLATION  01/30/2012   PVI by Dr. Rayann Heman  . ATRIAL FIBRILLATION ABLATION N/A 01/31/2012   Procedure: ATRIAL FIBRILLATION ABLATION;  Surgeon: Thompson Grayer, MD;  Location: [facility] CATH LAB;  Service: Cardiovascular;  Laterality: N/A;  . BACK SURGERY     X 3  . CARDIAC CATHETERIZATION N/A 08/09/2015   Procedure: Right/Left Heart Cath and Coronary Angiography;  Surgeon: Peter M Martinique, MD;  Location: [facility] CV LAB;  Service: Cardiovascular;  Laterality: N/A;  . CARDIAC CATHETERIZATION N/A 02/05/2016   Procedure: Right/Left Heart Cath and Coronary Angiography;  Surgeon: Jettie Booze, MD;  Location: [place] CV LAB;  Service: Cardiovascular;  Laterality: N/A;  . CARDIAC CATHETERIZATION N/A 04/17/2016   Procedure: Right Heart Cath;  Surgeon: Jolaine Artist, MD;  Location: [facility] CV LAB;  Service: Cardiovascular;  Laterality: N/A;  . CARDIOVASCULAR STRESS TEST   03/12/2002   EF 48%, NO EVIDENCE OF ISCHEMIA  . CARDIOVERSION  01/2012; 03/31/2012  . CARDIOVERSION N/A 02/25/2012   Procedure: CARDIOVERSION;  Surgeon: Thompson Grayer, MD;  Location[facility] C[facility] LAB;  Service: Cardiovascular;  Laterality: N/A;  . CARDIOVERSION N/A 03/31/2012   Procedure: CARDIOVERSION;  Surgeon: Thompson Grayer, MD;  Location[facility] CATH LAB;  Service: Cardiovascular;  Laterality: N/A;  . CARDIOVERSION N/A 11/23/2015   Procedure: CARDIOVERSION;  Surgeon: Larey Dresser, MD;  Location: [facility] ENDOSCOPY;  Service: Cardiovascular;  Laterality: N/A;  . CARDIOVERSION N/A 04/08/2016   Procedure: CARDIOVERSION;  Surgeon: Jolaine Artist, MD;  Location: [facility] ENDOSCOPY;  Service: Cardiovascular;  Laterality: N/A;  . CARDIOVERSION N/A 09/14/2018   Procedure: CARDIOVERSION;  Surgeon: Josue Hector, MD;  Location: [facility];  Service: Cardiovascular;  Laterality: N/A;  . CLIPPING OF ATRIAL APPENDAGE N/A 08/22/2015   Procedure: CLIPPING OF ATRIAL APPENDAGE;  Surgeon: Ivin Poot, MD;  Location: [facility];  Service: Open Heart Surgery;  Laterality: N/A;  . DOPPLER ECHOCARDIOGRAPHY  03/11/2002   EF 70-75%  . FINGER SURGERY Left    Middle finger  . FINGER TENDON REPAIR  1980's   "right little finger" (03/31/2012)  . INSERT / REPLACE / REMOVE PACEMAKER     St Jude  . IR RADIOLOGIST EVAL & MGMT  05/06/2019  . IR RADIOLOGIST EVAL & MGMT  10/06/2019  . LEFT AND RIGHT HEART CATHETERIZATION WITH CORONARY ANGIOGRAM N/A 10/27/2012   Procedure: LEFT AND RIGHT HEART CATHETERIZATION WITH CORONARY ANGIOGRAM;  Surgeon: Laverda Page, MD;  Location: [facility] CATH LAB;  Service: Cardiovascular;  Laterality: N/A;  . LUMBAR DISC SURGERY  1980's X2;  2000's  . MAZE N/A 08/22/2015   Procedure: MAZE;  Surgeon: Ivin Poot, MD;  Location: [facility] [facility];  Service: Open Heart Surgery;  Laterality: N/A;  . PACEMAKER INSERTION  03/31/2012   STJ Accent DR pacemaker implanted by Dr Rayann Heman  . PERMANENT PACEMAKER INSERTION N/A 03/31/2012    Procedure: PERMANENT PACEMAKER INSERTION;  Surgeon: Thompson Grayer, MD;  Location[facility] CATH LAB;  Service: Cardiovascular;  Laterality: N/A;  . TEE WITHOUT CARDIOVERSION  01/30/2012   Procedure: TRANSESOPHAGEAL ECHOCARDIOGRAM (TEE);  Surgeon: Larey Dresser, MD;  Location: [facility];  Service: Cardiovascular;  Laterality: N/A;  ablation next day  . TEE WITHOUT CARDIOVERSION N/A 08/22/2015  Procedure: TRANSESOPHAGEAL ECHOCARDIOGRAM (TEE);  Surgeon: Ivin Poot, MD;  Location: [facility];  Service: Open Heart Surgery;  Laterality: N/A;  . US ECHOCARDIOGRAPHY  08/07/2009   EF 55-60%   Family History Family History  Problem Relation Age of Onset  . Heart failure Mother   . Stroke Father     Social History Social History   Tobacco Use  . Smoking status: Former Smoker    Packs/day: 0.50    Years: 15.00    Pack years: 7.50    Types: Cigarettes    Quit date: 07/18/1991    Years since quitting: 28.2  . Smokeless tobacco: Never Used  Vaping Use  . Vaping Use: Never used  Substance Use Topics  . Alcohol use: Yes    Comment: occasional  . Drug use: Yes    Types: Marijuana    Comment: 02/2019   Allergies Meloxicam and Vancomycin  Review of Systems Review of Systems All other systems are reviewed and are negative for acute change except as noted in the HPI  Physical Exam Vital Signs  I have reviewed the triage vital signs BP (!) 144/77   Pulse 69   Temp 98.2 F (36.8 C) (Oral)   Resp 20   Ht 5\' 11"  (1.803 m)   Wt 86.2 kg   SpO2 95%   BMI 26.50 kg/m   Physical Exam Vitals reviewed.  Constitutional:      General: He is not in acute distress.    Appearance: He is well-developed. He is not diaphoretic.  HENT:     Head: Normocephalic and atraumatic.     Nose: Nose normal.  Eyes:     General: No scleral icterus.       Right eye: No discharge.        Left eye: No discharge.     Conjunctiva/sclera: Conjunctivae normal.     Pupils: Pupils are equal, round, and reactive to  light.  Cardiovascular:     Rate and Rhythm: Normal rate and regular rhythm.     Heart sounds: No murmur heard.  No friction rub. No gallop.   Pulmonary:     Effort: Tachypnea, accessory muscle usage and prolonged expiration present. No respiratory distress.     Breath sounds: Decreased air movement present. No stridor. Wheezing present. No rales.       Comments: Pursed lip breathing Chest:     Chest wall: Tenderness present.    Abdominal:     General: There is no distension.     Palpations: Abdomen is soft.     Tenderness: There is generalized abdominal tenderness. There is no guarding or rebound.  Musculoskeletal:        General: No tenderness.     Cervical back: Normal range of motion and neck supple.  Skin:    General: Skin is warm and dry.     Findings: No erythema or rash.  Neurological:     Mental Status: He is alert and oriented to person, place, and time.     ED Results and Treatments Labs (all labs ordered are listed, but only abnormal results are displayed) Labs Reviewed  COMPREHENSIVE METABOLIC PANEL - Abnormal; Notable for the following components:      Result Value   Glucose, Bld 133 (*)    Creatinine, Ser 1.38 (*)    GFR calc non Af Amer 51 (*)    GFR calc Af Amer 60 (*)    All other components within normal limits  CBC WITH  DIFFERENTIAL/PLATELET - Abnormal; Notable for the following components:   Platelets 131 (*)    Neutro Abs 8.7 (*)    Lymphs Abs 0.3 (*)    All other components within normal limits  BRAIN NATRIURETIC PEPTIDE - Abnormal; Notable for the following components:   B Natriuretic Peptide 228.2 (*)    All other components within normal limits  LIPASE, BLOOD                                                                                                                         EKG  EKG Interpretation  Date/Time:  Friday October 15 2019 03:13:00 EDT Ventricular Rate:  70 PR Interval:    QRS Duration: 173 QT Interval:  452 QTC  Calculation: 488 R Axis:   -76 Text Interpretation: Accelerated junctional rhythm Nonspecific IVCD with LAD LVH with secondary repolarization abnormality Baseline wander in lead(s) V4 Confirmed by Addison Lank [phone]) on 10/15/2019 6:44:39 AM      Radiology DG Chest 2 View  Result Date: 10/15/2019 CLINICAL DATA:  Increased shortness of breath and chest pain EXAM: CHEST - 2 VIEW COMPARISON:  Sep 09, 2019 FINDINGS: The heart size and mediastinal contours are within normal limits. The there is a left-sided pacemaker present with the lead tips in the right ventricle. Overlying median sternotomy wires are present. Aortic knob calcifications are present. Both lungs are clear. The visualized skeletal structures are unremarkable. IMPRESSION: No active cardiopulmonary disease. Electronically Signed   By: Prudencio Pair M.D.   On: 10/15/2019 04:49    Pertinent labs & imaging results that were available during my care of the patient were reviewed by me and considered in my medical decision making (see chart for details).  Medications Ordered in ED Medications  fentaNYL (SUBLIMAZE) injection 50 mcg (50 mcg Intravenous Given 10/15/19 0312)  AeroChamber Plus Flo-Vu Large MISC 1 each (has no administration in time range)  ondansetron (ZOFRAN) injection 4 mg (4 mg Intravenous Given 10/15/19 0311)  albuterol (VENTOLIN HFA) 108 (90 Base) MCG/ACT inhaler 6 puff (6 puffs Inhalation Given 10/15/19 0338)  ipratropium (ATROVENT HFA) inhaler 2 puff (2 puffs Inhalation Given 10/15/19 0338)  methylPREDNISolone sodium succinate (SOLU-MEDROL) 125 mg/2 mL injection 125 mg (125 mg Intravenous Given 10/15/19 0355)  metoCLOPramide (REGLAN) injection 10 mg (10 mg Intravenous Given 10/15/19 0523)  hyoscyamine (LEVSIN SL) SL tablet 0.25 mg (0.25 mg Sublingual Given 10/15/19 0523)  alum & mag hydroxide-simeth (MAALOX/MYLANTA) 200-200-20 MG/5ML suspension 30 mL (30 mLs Oral Given 10/15/19 0523)    And  lidocaine (XYLOCAINE) 2 % viscous  mouth solution 15 mL (15 mLs Oral Given 10/15/19 0523)  Procedures Procedures  (including critical care time)  Medical Decision Making / ED Course I have reviewed the nursing notes for this encounter and the patient's prior records (if available in EHR or on provided paperwork).   BEAR OSTEN was evaluated in Emergency Department on 10/15/2019 for the symptoms described in the history of present illness. He was evaluated in the context of the global COVID-19 pandemic, which necessitated consideration that the patient might be at risk for infection with the SARS-CoV-2 virus that causes COVID-19. Institutional protocols and algorithms that pertain to the evaluation of patients at risk for COVID-19 are in a state of rapid change based on information released by regulatory bodies including the CDC and federal and state organizations. These policies and algorithms were followed during the patient's care in the ED.  Patient presentation is suspicious for asthma/COPD exacerbation prompting severe bouts of coughing leading to emesis.  Patient is having muscular pain in the thorax and abdomen.  Abdominal exam is with generalized discomfort but not consistent with peritonitis.  Patient was provided with high-dose albuterol and Atrovent puffs with IV steroids.  Chest x-ray without evidence of pneumonia or pneumothorax.  No evidence of pulmonary edema.  Labs grossly reassuring without leukocytosis or anemia.  No significant electrolyte derangements.  Patient has improved renal function from prior.  No evidence of biliary obstruction or pancreatitis.  Provided with IV pain medicine and antiemetic.  On reassessment, patient had significant improvement in his work of breathing and air movement.  Wheezing had resolved.  Provided with GI cocktail, resulting in even more relief.  Tolerating PO.      Final Clinical Impression(s) / ED Diagnoses Final diagnoses:  SOB (shortness of breath)  Asthma with COPD with exacerbation (HCC)  Post-tussive emesis  Muscle soreness   The patient appears reasonably screened and/or stabilized for discharge and I doubt any other medical condition or other [facility] requiring further screening, evaluation, or treatment in the ED at this time prior to discharge. Safe for discharge with strict return precautions.  Disposition: Discharge  Condition: Good  I have discussed the results, Dx and Tx plan with the patient/family who expressed understanding and agree(s) with the plan. Discharge instructions discussed at length. The patient/family was given strict return precautions who verbalized understanding of the instructions. No further questions at time of discharge.    ED Discharge Orders         Ordered    predniSONE (DELTASONE) 10 MG tablet  Daily     Discontinue  Reprint     10/15/19 0620    ondansetron (ZOFRAN ODT) 4 MG disintegrating tablet  Every 8 hours PRN     Discontinue  Reprint     10/15/19 0620          Follow Up: Velna Hatchet, [place] [id number] [phone]  Schedule an appointment as soon as possible for a visit  in 3-5 days, If symptoms do not improve or  worsen      This chart was dictated using voice recognition software.  Despite best efforts to proofread,  errors can occur which can change the documentation meaning.     Fatima Blank, MD 10/15/19 [phone]

## 2019-10-15 NOTE — ED Triage Notes (Signed)
Pt c/o abd pain with nausea since yesterday. Pt also c/o "soreness" in chest. Oxygen sat 92% on RA. Pt placed on oxygen 2L via [place] which is what he is on at home.

## 2019-10-15 NOTE — ED Notes (Signed)
Pt requesting pain medication; MD aware.

## 2019-10-16 ENCOUNTER — Inpatient Hospital Stay (HOSPITAL_COMMUNITY)
Admission: EM | Admit: 2019-10-16 | Discharge: 2019-10-20 | DRG: 189 | Disposition: A | Discharge status: Home / Self Care | Payer: Medicare Other | Attending: Internal Medicine | Admitting: Internal Medicine

## 2019-10-16 ENCOUNTER — Emergency Department (HOSPITAL_COMMUNITY): Payer: Medicare Other

## 2019-10-16 DIAGNOSIS — Z79890 Hormone replacement therapy (postmenopausal): Secondary | ICD-10-CM

## 2019-10-16 DIAGNOSIS — N179 Acute kidney failure, unspecified: Secondary | ICD-10-CM | POA: Diagnosis present

## 2019-10-16 DIAGNOSIS — E1122 Type 2 diabetes mellitus with diabetic chronic kidney disease: Secondary | ICD-10-CM | POA: Diagnosis present

## 2019-10-16 DIAGNOSIS — I272 Pulmonary hypertension, unspecified: Secondary | ICD-10-CM | POA: Diagnosis present

## 2019-10-16 DIAGNOSIS — J449 Chronic obstructive pulmonary disease, unspecified: Secondary | ICD-10-CM

## 2019-10-16 DIAGNOSIS — Z79899 Other long term (current) drug therapy: Secondary | ICD-10-CM

## 2019-10-16 DIAGNOSIS — J9611 Chronic respiratory failure with hypoxia: Secondary | ICD-10-CM | POA: Diagnosis present

## 2019-10-16 DIAGNOSIS — Z9981 Dependence on supplemental oxygen: Secondary | ICD-10-CM

## 2019-10-16 DIAGNOSIS — I5032 Chronic diastolic (congestive) heart failure: Secondary | ICD-10-CM | POA: Diagnosis present

## 2019-10-16 DIAGNOSIS — Z7951 Long term (current) use of inhaled steroids: Secondary | ICD-10-CM

## 2019-10-16 DIAGNOSIS — J441 Chronic obstructive pulmonary disease with (acute) exacerbation: Secondary | ICD-10-CM | POA: Diagnosis present

## 2019-10-16 DIAGNOSIS — I442 Atrioventricular block, complete: Secondary | ICD-10-CM | POA: Diagnosis present

## 2019-10-16 DIAGNOSIS — I4892 Unspecified atrial flutter: Secondary | ICD-10-CM | POA: Diagnosis present

## 2019-10-16 DIAGNOSIS — I4819 Other persistent atrial fibrillation: Secondary | ICD-10-CM | POA: Diagnosis present

## 2019-10-16 DIAGNOSIS — E039 Hypothyroidism, unspecified: Secondary | ICD-10-CM | POA: Diagnosis present

## 2019-10-16 DIAGNOSIS — J9621 Acute and chronic respiratory failure with hypoxia: Principal | ICD-10-CM | POA: Diagnosis present

## 2019-10-16 DIAGNOSIS — G4733 Obstructive sleep apnea (adult) (pediatric): Secondary | ICD-10-CM | POA: Diagnosis present

## 2019-10-16 DIAGNOSIS — G2581 Restless legs syndrome: Secondary | ICD-10-CM | POA: Diagnosis present

## 2019-10-16 DIAGNOSIS — J329 Chronic sinusitis, unspecified: Secondary | ICD-10-CM | POA: Diagnosis present

## 2019-10-16 DIAGNOSIS — Z9889 Other specified postprocedural states: Secondary | ICD-10-CM

## 2019-10-16 DIAGNOSIS — E874 Mixed disorder of acid-base balance: Secondary | ICD-10-CM | POA: Diagnosis present

## 2019-10-16 DIAGNOSIS — N1832 Chronic kidney disease, stage 3b: Secondary | ICD-10-CM | POA: Diagnosis present

## 2019-10-16 DIAGNOSIS — E785 Hyperlipidemia, unspecified: Secondary | ICD-10-CM | POA: Diagnosis present

## 2019-10-16 DIAGNOSIS — Z87891 Personal history of nicotine dependence: Secondary | ICD-10-CM

## 2019-10-16 DIAGNOSIS — Z950 Presence of cardiac pacemaker: Secondary | ICD-10-CM

## 2019-10-16 DIAGNOSIS — Z7901 Long term (current) use of anticoagulants: Secondary | ICD-10-CM

## 2019-10-16 DIAGNOSIS — E872 Acidosis: Secondary | ICD-10-CM | POA: Diagnosis present

## 2019-10-16 DIAGNOSIS — I130 Hypertensive heart and chronic kidney disease with heart failure and stage 1 through stage 4 chronic kidney disease, or unspecified chronic kidney disease: Secondary | ICD-10-CM | POA: Diagnosis present

## 2019-10-16 DIAGNOSIS — I2510 Atherosclerotic heart disease of native coronary artery without angina pectoris: Secondary | ICD-10-CM | POA: Diagnosis present

## 2019-10-16 DIAGNOSIS — Z7952 Long term (current) use of systemic steroids: Secondary | ICD-10-CM

## 2019-10-16 DIAGNOSIS — Z20822 Contact with and (suspected) exposure to covid-19: Secondary | ICD-10-CM | POA: Diagnosis present

## 2019-10-16 DIAGNOSIS — J9601 Acute respiratory failure with hypoxia: Secondary | ICD-10-CM

## 2019-10-16 DIAGNOSIS — K219 Gastro-esophageal reflux disease without esophagitis: Secondary | ICD-10-CM | POA: Diagnosis present

## 2019-10-16 DIAGNOSIS — I4891 Unspecified atrial fibrillation: Secondary | ICD-10-CM | POA: Diagnosis not present

## 2019-10-16 DIAGNOSIS — Z79891 Long term (current) use of opiate analgesic: Secondary | ICD-10-CM

## 2019-10-16 HISTORY — DX: Chronic obstructive pulmonary disease, unspecified: J44.9

## 2019-10-16 LAB — CBC WITH DIFFERENTIAL/PLATELET
Abs Immature Granulocytes: 0.13 10*3/uL — ABNORMAL HIGH (ref 0.00–0.07)
Basophils Absolute: 0 10*3/uL (ref 0.0–0.1)
Basophils Relative: 0 %
Eosinophils Absolute: 0 10*3/uL (ref 0.0–0.5)
Eosinophils Relative: 0 %
HCT: 44.7 % (ref 39.0–52.0)
Hemoglobin: 14.5 g/dL (ref 13.0–17.0)
Immature Granulocytes: 1 %
Lymphocytes Relative: 6 %
Lymphs Abs: 1 10*3/uL (ref 0.7–4.0)
MCH: 30 pg (ref 26.0–34.0)
MCHC: 32.4 g/dL (ref 30.0–36.0)
MCV: 92.5 fL (ref 80.0–100.0)
Monocytes Absolute: 1.2 10*3/uL — ABNORMAL HIGH (ref 0.1–1.0)
Monocytes Relative: 7 %
Neutro Abs: 15 10*3/uL — ABNORMAL HIGH (ref 1.7–7.7)
Neutrophils Relative %: 86 %
Platelets: 226 10*3/uL (ref 150–400)
RBC: 4.83 MIL/uL (ref 4.22–5.81)
RDW: 14.1 % (ref 11.5–15.5)
WBC: 17.3 10*3/uL — ABNORMAL HIGH (ref 4.0–10.5)
nRBC: 0 % (ref 0.0–0.2)

## 2019-10-16 LAB — I-STAT CHEM 8, ED
BUN: 29 mg/dL — ABNORMAL HIGH (ref 8–23)
Calcium, Ion: 1.08 mmol/L — ABNORMAL LOW (ref 1.15–1.40)
Chloride: 104 mmol/L (ref 98–111)
Creatinine, Ser: 1.4 mg/dL — ABNORMAL HIGH (ref 0.61–1.24)
Glucose, Bld: 155 mg/dL — ABNORMAL HIGH (ref 70–99)
HCT: 43 % (ref 39.0–52.0)
Hemoglobin: 14.6 g/dL (ref 13.0–17.0)
Potassium: 3.8 mmol/L (ref 3.5–5.1)
Sodium: 139 mmol/L (ref 135–145)
TCO2: 20 mmol/L — ABNORMAL LOW (ref 22–32)

## 2019-10-16 LAB — HEMOGLOBIN A1C
Hgb A1c MFr Bld: 6.6 % — ABNORMAL HIGH (ref 4.8–5.6)
Mean Plasma Glucose: 142.72 mg/dL

## 2019-10-16 LAB — I-STAT VENOUS BLOOD GAS, ED
Acid-Base Excess: 0 mmol/L (ref 0.0–2.0)
Bicarbonate: 20.9 mmol/L (ref 20.0–28.0)
Calcium, Ion: 1.09 mmol/L — ABNORMAL LOW (ref 1.15–1.40)
HCT: 42 % (ref 39.0–52.0)
Hemoglobin: 14.3 g/dL (ref 13.0–17.0)
O2 Saturation: 98 %
Potassium: 3.7 mmol/L (ref 3.5–5.1)
Sodium: 139 mmol/L (ref 135–145)
TCO2: 22 mmol/L (ref 22–32)
pCO2, Ven: 24.7 mmHg — ABNORMAL LOW (ref 44.0–60.0)
pH, Ven: 7.536 — ABNORMAL HIGH (ref 7.250–7.430)
pO2, Ven: 96 mmHg — ABNORMAL HIGH (ref 32.0–45.0)

## 2019-10-16 LAB — TROPONIN I (HIGH SENSITIVITY)
Troponin I (High Sensitivity): 23 ng/L — ABNORMAL HIGH (ref <18)
Troponin I (High Sensitivity): 21 ng/L — ABNORMAL HIGH (ref <18)

## 2019-10-16 LAB — URINALYSIS, ROUTINE W REFLEX MICROSCOPIC
Bilirubin Urine: NEGATIVE
Glucose, UA: 50 mg/dL — AB
Hgb urine dipstick: NEGATIVE
Ketones, ur: NEGATIVE mg/dL
Leukocytes,Ua: NEGATIVE
Nitrite: NEGATIVE
Protein, ur: NEGATIVE mg/dL
Specific Gravity, Urine: 1.035 — ABNORMAL HIGH (ref 1.005–1.030)
pH: 5 (ref 5.0–8.0)

## 2019-10-16 LAB — BRAIN NATRIURETIC PEPTIDE: B Natriuretic Peptide: 243.1 pg/mL — ABNORMAL HIGH (ref 0.0–100.0)

## 2019-10-16 LAB — GLUCOSE, CAPILLARY: Glucose-Capillary: 177 mg/dL — ABNORMAL HIGH (ref 70–99)

## 2019-10-16 LAB — COMPREHENSIVE METABOLIC PANEL
ALT: 21 U/L (ref 0–44)
AST: 26 U/L (ref 15–41)
Albumin: 4.2 g/dL (ref 3.5–5.0)
Alkaline Phosphatase: 68 U/L (ref 38–126)
Anion gap: 18 — ABNORMAL HIGH (ref 5–15)
BUN: 28 mg/dL — ABNORMAL HIGH (ref 8–23)
CO2: 17 mmol/L — ABNORMAL LOW (ref 22–32)
Calcium: 9.9 mg/dL (ref 8.9–10.3)
Chloride: 103 mmol/L (ref 98–111)
Creatinine, Ser: 1.66 mg/dL — ABNORMAL HIGH (ref 0.61–1.24)
GFR calc Af Amer: 48 mL/min — ABNORMAL LOW (ref >60)
GFR calc non Af Amer: 41 mL/min — ABNORMAL LOW (ref >60)
Glucose, Bld: 142 mg/dL — ABNORMAL HIGH (ref 70–99)
Potassium: 4 mmol/L (ref 3.5–5.1)
Sodium: 138 mmol/L (ref 135–145)
Total Bilirubin: 1.2 mg/dL (ref 0.3–1.2)
Total Protein: 7.4 g/dL (ref 6.5–8.1)

## 2019-10-16 LAB — PROTIME-INR
INR: 1.3 — ABNORMAL HIGH (ref 0.8–1.2)
Prothrombin Time: 15.3 seconds — ABNORMAL HIGH (ref 11.4–15.2)

## 2019-10-16 LAB — APTT: aPTT: 29 seconds (ref 24–36)

## 2019-10-16 LAB — ACETAMINOPHEN LEVEL: Acetaminophen (Tylenol), Serum: <10 ug/mL — ABNORMAL LOW (ref 10–30)

## 2019-10-16 LAB — D-DIMER, QUANTITATIVE: D-Dimer, Quant: 1.16 ug/mL-FEU — ABNORMAL HIGH (ref 0.00–0.50)

## 2019-10-16 LAB — LACTIC ACID, PLASMA
Lactic Acid, Venous: 5.5 mmol/L (ref 0.5–1.9)
Lactic Acid, Venous: 5.8 mmol/L (ref 0.5–1.9)
Lactic Acid, Venous: 3.5 mmol/L (ref 0.5–1.9)

## 2019-10-16 LAB — LIPASE, BLOOD: Lipase: 80 U/L — ABNORMAL HIGH (ref 11–51)

## 2019-10-16 LAB — SARS CORONAVIRUS 2 BY RT PCR (HOSPITAL ORDER, PERFORMED IN ~~LOC~~ HOSPITAL LAB): SARS Coronavirus 2: NEGATIVE

## 2019-10-16 LAB — CBG MONITORING, ED: Glucose-Capillary: 172 mg/dL — ABNORMAL HIGH (ref 70–99)

## 2019-10-16 LAB — SALICYLATE LEVEL: Salicylate Lvl: <7 mg/dL — ABNORMAL LOW (ref 7.0–30.0)

## 2019-10-16 MED ORDER — APIXABAN 5 MG PO TABS
5.0000 mg | ORAL_TABLET | Freq: Two times a day (BID) | ORAL | Status: DC
  Administered 2019-10-17 – 2019-10-20 (×8): 5 mg via ORAL
  Filled 2019-10-16 (×9): qty 1

## 2019-10-16 MED ORDER — SODIUM CHLORIDE 0.9 % IV SOLN
2.0000 g | Freq: Once | INTRAVENOUS | Status: DC
  Filled 2019-10-16: qty 2

## 2019-10-16 MED ORDER — LEVOTHYROXINE SODIUM 75 MCG PO TABS
175.0000 ug | ORAL_TABLET | Freq: Every day | ORAL | Status: DC
  Administered 2019-10-17 – 2019-10-20 (×4): 175 ug via ORAL
  Filled 2019-10-16 (×4): qty 1

## 2019-10-16 MED ORDER — INSULIN ASPART 100 UNIT/ML ~~LOC~~ SOLN
0.0000 [IU] | Freq: Three times a day (TID) | SUBCUTANEOUS | Status: DC
  Administered 2019-10-17 (×2): 1 [IU] via SUBCUTANEOUS
  Administered 2019-10-17: 3 [IU] via SUBCUTANEOUS
  Administered 2019-10-18: 2 [IU] via SUBCUTANEOUS
  Administered 2019-10-18: 1 [IU] via SUBCUTANEOUS
  Administered 2019-10-19: 3 [IU] via SUBCUTANEOUS
  Administered 2019-10-19 – 2019-10-20 (×3): 2 [IU] via SUBCUTANEOUS
  Administered 2019-10-20: 1 [IU] via SUBCUTANEOUS

## 2019-10-16 MED ORDER — SODIUM CHLORIDE 0.9 % IV BOLUS (SEPSIS)
1000.0000 mL | Freq: Once | INTRAVENOUS | Status: AC
  Administered 2019-10-16: 1000 mL via INTRAVENOUS

## 2019-10-16 MED ORDER — AMLODIPINE BESYLATE 5 MG PO TABS
5.0000 mg | ORAL_TABLET | Freq: Every day | ORAL | Status: DC
  Administered 2019-10-17 – 2019-10-20 (×4): 5 mg via ORAL
  Filled 2019-10-16 (×4): qty 1

## 2019-10-16 MED ORDER — SODIUM CHLORIDE 0.9 % IV SOLN
2.0000 g | Freq: Two times a day (BID) | INTRAVENOUS | Status: DC
  Administered 2019-10-16: 2 g via INTRAVENOUS

## 2019-10-16 MED ORDER — IPRATROPIUM-ALBUTEROL 0.5-2.5 (3) MG/3ML IN SOLN: 3.0000 mL | RESPIRATORY_TRACT | Status: DC | PRN

## 2019-10-16 MED ORDER — POTASSIUM CHLORIDE CRYS ER 20 MEQ PO TBCR
20.0000 meq | EXTENDED_RELEASE_TABLET | Freq: Two times a day (BID) | ORAL | Status: DC
  Administered 2019-10-16: 20 meq via ORAL
  Filled 2019-10-16: qty 1

## 2019-10-16 MED ORDER — METHYLPREDNISOLONE SODIUM SUCC 40 MG IJ SOLR
40.0000 mg | Freq: Four times a day (QID) | INTRAMUSCULAR | Status: DC
  Administered 2019-10-17 (×3): 40 mg via INTRAVENOUS
  Filled 2019-10-16 (×3): qty 1

## 2019-10-16 MED ORDER — HYDROCODONE-ACETAMINOPHEN 5-325 MG PO TABS: 1.0000 | ORAL_TABLET | Freq: Two times a day (BID) | ORAL | Status: DC | PRN

## 2019-10-16 MED ORDER — VANCOMYCIN HCL IN DEXTROSE 1-5 GM/200ML-% IV SOLN: 1000.0000 mg | Freq: Once | INTRAVENOUS | Status: DC

## 2019-10-16 MED ORDER — IOHEXOL 350 MG/ML SOLN
80.0000 mL | Freq: Once | INTRAVENOUS | Status: AC | PRN
  Administered 2019-10-16: 80 mL via INTRAVENOUS

## 2019-10-16 MED ORDER — SODIUM CHLORIDE 0.9 % IV BOLUS
1600.0000 mL | Freq: Once | INTRAVENOUS | Status: AC
  Administered 2019-10-16: 1600 mL via INTRAVENOUS

## 2019-10-16 MED ORDER — ROPINIROLE HCL 1 MG PO TABS
2.0000 mg | ORAL_TABLET | Freq: Every day | ORAL | Status: DC
  Administered 2019-10-16 – 2019-10-19 (×5): 2 mg via ORAL
  Filled 2019-10-16 (×5): qty 2

## 2019-10-16 MED ORDER — AZITHROMYCIN 250 MG PO TABS: 250.0000 mg | ORAL_TABLET | Freq: Every day | ORAL | Status: DC

## 2019-10-16 MED ORDER — IPRATROPIUM BROMIDE HFA 17 MCG/ACT IN AERS: 2.0000 | INHALATION_SPRAY | Freq: Two times a day (BID) | RESPIRATORY_TRACT | Status: DC

## 2019-10-16 MED ORDER — ONDANSETRON 4 MG PO TBDP
4.0000 mg | ORAL_TABLET | Freq: Three times a day (TID) | ORAL | Status: DC | PRN
  Filled 2019-10-16: qty 1

## 2019-10-16 MED ORDER — SODIUM CHLORIDE 0.9 % IV SOLN: INTRAVENOUS | Status: AC

## 2019-10-16 MED ORDER — ALBUTEROL SULFATE (2.5 MG/3ML) 0.083% IN NEBU: 2.5000 mg | INHALATION_SOLUTION | Freq: Four times a day (QID) | RESPIRATORY_TRACT | Status: DC

## 2019-10-16 MED ORDER — IPRATROPIUM-ALBUTEROL 0.5-2.5 (3) MG/3ML IN SOLN
3.0000 mL | Freq: Four times a day (QID) | RESPIRATORY_TRACT | Status: DC
  Administered 2019-10-17: 3 mL via RESPIRATORY_TRACT
  Filled 2019-10-16: qty 3

## 2019-10-16 MED ORDER — METRONIDAZOLE IN NACL 5-0.79 MG/ML-% IV SOLN
500.0000 mg | Freq: Once | INTRAVENOUS | Status: AC
  Administered 2019-10-16: 500 mg via INTRAVENOUS
  Filled 2019-10-16: qty 100

## 2019-10-16 MED ORDER — DOFETILIDE 250 MCG PO CAPS
250.0000 ug | ORAL_CAPSULE | Freq: Two times a day (BID) | ORAL | Status: DC
  Administered 2019-10-16 – 2019-10-18 (×4): 250 ug via ORAL
  Filled 2019-10-16 (×4): qty 1

## 2019-10-16 MED ORDER — TRAZODONE HCL 50 MG PO TABS
25.0000 mg | ORAL_TABLET | Freq: Every day | ORAL | Status: DC
  Administered 2019-10-16 – 2019-10-19 (×4): 25 mg via ORAL
  Filled 2019-10-16 (×4): qty 1

## 2019-10-16 MED ORDER — MAGNESIUM SULFATE IN D5W 1-5 GM/100ML-% IV SOLN
1.0000 g | Freq: Once | INTRAVENOUS | Status: DC
  Filled 2019-10-16: qty 100

## 2019-10-16 MED ORDER — DOXYCYCLINE HYCLATE 100 MG PO TABS
100.0000 mg | ORAL_TABLET | Freq: Two times a day (BID) | ORAL | Status: DC
  Administered 2019-10-16 – 2019-10-20 (×8): 100 mg via ORAL
  Filled 2019-10-16 (×9): qty 1

## 2019-10-16 MED ORDER — AZITHROMYCIN 250 MG PO TABS: 500.0000 mg | ORAL_TABLET | Freq: Every day | ORAL | Status: DC

## 2019-10-16 MED ORDER — LORAZEPAM 2 MG/ML IJ SOLN
1.0000 mg | Freq: Once | INTRAMUSCULAR | Status: AC
  Administered 2019-10-16: 1 mg via INTRAVENOUS
  Filled 2019-10-16: qty 1

## 2019-10-16 MED ORDER — IPRATROPIUM-ALBUTEROL 0.5-2.5 (3) MG/3ML IN SOLN
3.0000 mL | RESPIRATORY_TRACT | Status: DC
  Filled 2019-10-16: qty 3

## 2019-10-16 MED ORDER — POLYETHYLENE GLYCOL 3350 17 GM/SCOOP PO POWD
17.0000 g | Freq: Every day | ORAL | Status: DC | PRN
  Filled 2019-10-16: qty 255

## 2019-10-16 MED ORDER — SPIRONOLACTONE 25 MG PO TABS
25.0000 mg | ORAL_TABLET | Freq: Every day | ORAL | Status: DC
  Administered 2019-10-16 – 2019-10-20 (×5): 25 mg via ORAL
  Filled 2019-10-16 (×5): qty 1

## 2019-10-16 MED ORDER — DOFETILIDE 250 MCG PO CAPS: 250.0000 ug | ORAL_CAPSULE | Freq: Two times a day (BID) | ORAL | Status: DC

## 2019-10-16 MED ORDER — GABAPENTIN 400 MG PO CAPS
1200.0000 mg | ORAL_CAPSULE | Freq: Every day | ORAL | Status: DC
  Administered 2019-10-17 – 2019-10-19 (×4): 1200 mg via ORAL
  Filled 2019-10-16 (×5): qty 3

## 2019-10-16 MED ORDER — LORATADINE 10 MG PO TABS
10.0000 mg | ORAL_TABLET | Freq: Every day | ORAL | Status: DC
  Administered 2019-10-17 – 2019-10-20 (×4): 10 mg via ORAL
  Filled 2019-10-16 (×4): qty 1

## 2019-10-16 MED ORDER — METOPROLOL TARTRATE 50 MG PO TABS
50.0000 mg | ORAL_TABLET | Freq: Two times a day (BID) | ORAL | Status: DC
  Administered 2019-10-16 – 2019-10-17 (×2): 50 mg via ORAL
  Filled 2019-10-16: qty 1
  Filled 2019-10-16: qty 2

## 2019-10-16 MED ORDER — VANCOMYCIN HCL 750 MG/150ML IV SOLN
750.0000 mg | Freq: Two times a day (BID) | INTRAVENOUS | Status: DC
  Filled 2019-10-16: qty 150

## 2019-10-16 MED ORDER — VANCOMYCIN HCL 1750 MG/350ML IV SOLN
1750.0000 mg | Freq: Once | INTRAVENOUS | Status: AC
  Administered 2019-10-16: 1750 mg via INTRAVENOUS
  Filled 2019-10-16: qty 350

## 2019-10-16 MED ORDER — TRAMADOL HCL 50 MG PO TABS
50.0000 mg | ORAL_TABLET | Freq: Two times a day (BID) | ORAL | Status: DC
  Administered 2019-10-16 – 2019-10-20 (×8): 50 mg via ORAL
  Filled 2019-10-16 (×8): qty 1

## 2019-10-16 MED ORDER — FLUTICASONE FUROATE-VILANTEROL 200-25 MCG/INH IN AEPB
1.0000 | INHALATION_SPRAY | Freq: Every day | RESPIRATORY_TRACT | Status: DC
  Administered 2019-10-17: 1 via RESPIRATORY_TRACT
  Filled 2019-10-16: qty 28

## 2019-10-16 MED ORDER — GUAIFENESIN 100 MG/5ML PO SOLN
5.0000 mL | ORAL | Status: DC | PRN
  Filled 2019-10-16 (×2): qty 5

## 2019-10-16 MED ORDER — PANTOPRAZOLE SODIUM 40 MG PO TBEC
40.0000 mg | DELAYED_RELEASE_TABLET | Freq: Every day | ORAL | Status: DC
  Administered 2019-10-17 (×2): 40 mg via ORAL
  Filled 2019-10-16 (×2): qty 1

## 2019-10-16 MED ORDER — INSULIN ASPART 100 UNIT/ML ~~LOC~~ SOLN: 0.0000 [IU] | Freq: Every day | SUBCUTANEOUS | Status: DC

## 2019-10-16 MED ORDER — GABAPENTIN 300 MG PO CAPS
600.0000 mg | ORAL_CAPSULE | Freq: Two times a day (BID) | ORAL | Status: DC
  Administered 2019-10-17 – 2019-10-20 (×8): 600 mg via ORAL
  Filled 2019-10-16 (×9): qty 2

## 2019-10-16 MED ORDER — VITAMIN D 25 MCG (1000 UNIT) PO TABS
8000.0000 [IU] | ORAL_TABLET | Freq: Every day | ORAL | Status: DC
  Administered 2019-10-17 – 2019-10-19 (×4): 8000 [IU] via ORAL
  Filled 2019-10-16 (×5): qty 8

## 2019-10-16 MED ORDER — ROSUVASTATIN CALCIUM 5 MG PO TABS
10.0000 mg | ORAL_TABLET | Freq: Every day | ORAL | Status: DC
  Administered 2019-10-16 – 2019-10-19 (×4): 10 mg via ORAL
  Filled 2019-10-16 (×4): qty 2

## 2019-10-16 NOTE — H&P (Signed)
History and Physical    [name]:[id number] DOB: 05-18-1950 DOA: 10/16/2019  PCP: Velna Hatchet, MD (Confirm with patient/family/NH records and if not entered, this has to be entered at [facility] point of entry) Patient coming from: Home  I have personally briefly reviewed patient's old medical records in [place]  Chief Complaint: SOB  HPI: [name] is a 69 y.o. male with medical history significant of chronic hypoxic respite failure on home O2, advanced COPD with frequent flareup, A. fib on Tikosyn and systemic anticoagulation, IIDM, with increasing short of breath, wheezing and cough and poor oral intake for 3 days.  Denies any fever chills, patient has had 4 episodes of COPD flareup this year.  He uses home oxygen 2 L at night, but he does not have portable oxygen, for last 3 days he gradually started to feeling increasing short of breath and wheezing. No fever or chills, no chest pain.  His p.o. intake significantly affected with increasing short of breath and A. fib was quite nauseous every time he tried to swallow even water.  Denies any abdominal pain no diarrhea.  Patient came to ED yesterday for same acute issue, was discharged with p.o. steroid which he took last night and this morning.  Patient also did not take several of his medications for last 3 days because feeling sick including Tikosyn. ED Course: Hypoxic and improved with BiPAP and breathing treatment.  Lactic acid came back 5.5 improved to 3.5 after IV boluses.  Review of Systems: As per HPI otherwise 10 point review of systems negative.    Past Medical History:  Diagnosis Date  . Aortic stenosis, mild   . Arthritis of hand    "just a little bit in both hands" (03/31/2012)  . Asthma    "little bit" (03/31/2012)  . Atrial fibrillation [facility])    dx '04; DCCV '04, placed on flecainide, failed DCCV 04/2010, flecainide stopped; s/p successful A.fib ablation 01/31/12  . CHF (congestive heart failure) ([place])     . Diabetes mellitus without complication (HCC)    borderline  . DJD (degenerative joint disease)   . Exertional dyspnea   . Fibromyalgia   . First degree AV block    post ablation, ~450ms  . GERD (gastroesophageal reflux disease)   . Heart murmur   . Hyperlipidemia   . Hypertension   . Hypothyroidism    S/P radiation  . Left atrial enlargement    LA size 81mm by echo 11/21/11  . Mitral regurgitation    trivial  . Obesity   . Obstructive sleep apnea    mild by sleep study 2013; pt stated he does not have a machine because "it wasn't bad enough for him to have one"  . Pacemaker 03/31/2012  . Restless leg syndrome   . Second degree AV block   . Splenomegaly   . Visit for monitoring Tikosyn therapy 04/16/2016    Past Surgical History:  Procedure Laterality Date  . AORTIC VALVE REPLACEMENT N/A 08/22/2015   Procedure: AORTIC VALVE REPLACEMENT (AVR);  Surgeon: Ivin Poot, MD;  Location: [place];  Service: Open Heart Surgery;  Laterality: N/A;  . ATRIAL FIBRILLATION ABLATION  01/30/2012   PVI by Dr. Rayann Heman  . ATRIAL FIBRILLATION ABLATION N/A 01/31/2012   Procedure: ATRIAL FIBRILLATION ABLATION;  Surgeon: Thompson Grayer, MD;  Location: [facility] CATH LAB;  Service: Cardiovascular;  Laterality: N/A;  . BACK SURGERY     X 3  . CARDIAC CATHETERIZATION N/A 08/09/2015  Procedure: Right/Left Heart Cath and Coronary Angiography;  Surgeon: Peter M Martinique, MD;  Location: [place] CV LAB;  Service: Cardiovascular;  Laterality: N/A;  . CARDIAC CATHETERIZATION N/A 02/05/2016   Procedure: Right/Left Heart Cath and Coronary Angiography;  Surgeon: Jettie Booze, MD;  Location: [place] CV LAB;  Service: Cardiovascular;  Laterality: N/A;  . CARDIAC CATHETERIZATION N/A 04/17/2016   Procedure: Right Heart Cath;  Surgeon: Jolaine Artist, MD;  Location: [place] CV LAB;  Service: Cardiovascular;  Laterality: N/A;  . CARDIOVASCULAR STRESS TEST  03/12/2002   EF 48%, NO EVIDENCE OF ISCHEMIA  .  CARDIOVERSION  01/2012; 03/31/2012  . CARDIOVERSION N/A 02/25/2012   Procedure: CARDIOVERSION;  Surgeon: Thompson Grayer, MD;  Location: [facility] CATH LAB;  Service: Cardiovascular;  Laterality: N/A;  . CARDIOVERSION N/A 03/31/2012   Procedure: CARDIOVERSION;  Surgeon: Thompson Grayer, MD;  Location: [facility] CATH LAB;  Service: Cardiovascular;  Laterality: N/A;  . CARDIOVERSION N/A 11/23/2015   Procedure: CARDIOVERSION;  Surgeon: Larey Dresser, MD;  Location: [facility] ENDOSCOPY;  Service: Cardiovascular;  Laterality: N/A;  . CARDIOVERSION N/A 04/08/2016   Procedure: CARDIOVERSION;  Surgeon: Jolaine Artist, MD;  Location: [facility] ENDOSCOPY;  Service: Cardiovascular;  Laterality: N/A;  . CARDIOVERSION N/A 09/14/2018   Procedure: CARDIOVERSION;  Surgeon: Josue Hector, MD;  Location: [place];  Service: Cardiovascular;  Laterality: N/A;  . CLIPPING OF ATRIAL APPENDAGE N/A 08/22/2015   Procedure: CLIPPING OF ATRIAL APPENDAGE;  Surgeon: Ivin Poot, MD;  Location: [place];  Service: Open Heart Surgery;  Laterality: N/A;  . DOPPLER ECHOCARDIOGRAPHY  03/11/2002   EF 70-75%  . FINGER SURGERY Left    Middle finger  . FINGER TENDON REPAIR  1980's   "right little finger" (03/31/2012)  . INSERT / REPLACE / REMOVE PACEMAKER     [employer]  . IR RADIOLOGIST EVAL & MGMT  05/06/2019  . IR RADIOLOGIST EVAL & MGMT  10/06/2019  . LEFT AND RIGHT HEART CATHETERIZATION WITH CORONARY ANGIOGRAM N/A 10/27/2012   Procedure: LEFT AND RIGHT HEART CATHETERIZATION WITH CORONARY ANGIOGRAM;  Surgeon: Laverda Page, MD;  Location: [facility] CATH LAB;  Service: Cardiovascular;  Laterality: N/A;  . LUMBAR DISC SURGERY  1980's X2;  2000's  . MAZE N/A 08/22/2015   Procedure: MAZE;  Surgeon: Ivin Poot, MD;  Location: [place];  Service: Open Heart Surgery;  Laterality: N/A;  . PACEMAKER INSERTION  03/31/2012   STJ Accent DR pacemaker implanted by Dr Rayann Heman  . PERMANENT PACEMAKER INSERTION N/A 03/31/2012   Procedure: PERMANENT PACEMAKER INSERTION;   Surgeon: Thompson Grayer, MD;  Location: [facility] CATH LAB;  Service: Cardiovascular;  Laterality: N/A;  . TEE WITHOUT CARDIOVERSION  01/30/2012   Procedure: TRANSESOPHAGEAL ECHOCARDIOGRAM (TEE);  Surgeon: Larey Dresser, MD;  Location: [place];  Service: Cardiovascular;  Laterality: N/A;  ablation next day  . TEE WITHOUT CARDIOVERSION N/A 08/22/2015   Procedure: TRANSESOPHAGEAL ECHOCARDIOGRAM (TEE);  Surgeon: Ivin Poot, MD;  Location: [place];  Service: Open Heart Surgery;  Laterality: N/A;  . US ECHOCARDIOGRAPHY  08/07/2009   EF 55-60%     reports that he quit smoking about 28 years ago. His smoking use included cigarettes. He has a 7.50 pack-year smoking history. He has never used smokeless tobacco. He reports current alcohol use. He reports current drug use. Drug: Marijuana.  Allergies  Allergen Reactions  . Meloxicam Rash  . 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 syndrome 09/02/15, resolved with diphenhydramine and slowing  of rate    Family History  Problem Relation Age of Onset  . Heart failure Mother   . Stroke Father     Prior to Admission medications   Medication Sig Start Date End Date Taking? Authorizing Provider  albuterol (PROVENTIL HFA;VENTOLIN HFA) 108 (90 Base) MCG/ACT inhaler Inhale 2 puffs into the lungs every 6 (six) hours as needed for wheezing or shortness of breath. 12/28/15  Yes Collene Gobble, MD  amLODipine (NORVASC) 10 MG tablet Take 0.5 tablets (5 mg total) by mouth daily. 09/18/15  Yes Nahser, Wonda Cheng, MD  apixaban (ELIQUIS) 5 MG TABS tablet TAKE 1 TABLET(5 MG) BY MOUTH TWICE DAILY Patient taking differently: Take 5 mg by mouth 2 (two) times daily.  02/25/19  Yes Bensimhon, Shaune Pascal, MD  budesonide-formoterol [facility]) 160-4.5 MCG/ACT inhaler Inhale 2 puffs into the lungs 2 (two) times daily. 11/13/15  Yes Collene Gobble, MD  Cholecalciferol (VITAMIN D) 2000 UNITS CAPS Take 8,000 Units by mouth at bedtime.    Yes [provider]  dofetilide  (TIKOSYN) 250 MCG capsule Take 1 capsule (250 mcg total) by mouth 2 (two) times daily. 06/08/19  Yes Sherran Needs, NP  fexofenadine (ALLEGRA) 180 MG tablet Take 1 tablet (180 mg total) by mouth daily. Patient taking differently: Take 180 mg by mouth daily as needed for allergies or rhinitis.  03/01/14  Yes Collene Gobble, MD  gabapentin (NEURONTIN) 300 MG capsule Take 600-1,200 mg by mouth See admin instructions. Take 2 capsules ( 600 mg) by mouth in the morning and  at lunchtime, and take 4 capsules (1,200 mg) at bedtime 09/13/14  Yes [provider]  HYDROcodone-acetaminophen (NORCO/VICODIN) 5-325 MG tablet Take 1 tablet by mouth 2 (two) times daily as needed (pain).    Yes [provider]  ipratropium (ATROVENT HFA) 17 MCG/ACT inhaler Inhale 2 puffs into the lungs 2 (two) times daily.   Yes [provider]  levothyroxine (SYNTHROID, LEVOTHROID) 175 MCG tablet Take 175 mcg by mouth daily before breakfast.   Yes [provider]  MAGNESIUM PO Take 1 tablet by mouth at bedtime.   Yes [provider]  metoprolol tartrate (LOPRESSOR) 50 MG tablet TAKE 1 TABLET BY MOUTH 2  TIMES DAILY. Patient taking differently: Take 50 mg by mouth 2 (two) times daily.  06/30/19  Yes Larey Dresser, MD  ondansetron (ZOFRAN ODT) 4 MG disintegrating tablet Take 1 tablet (4 mg total) by mouth every 8 (eight) hours as needed for nausea. Patient taking differently: Take 4 mg by mouth 2 (two) times daily.  10/15/19  Yes Cardama, Grayce Sessions, MD  OXYGEN Inhale 2 L into the lungs continuous.   Yes [provider]  pantoprazole (PROTONIX) 40 MG tablet Take 40 mg by mouth daily.   Yes [provider]  polyethylene glycol powder (GLYCOLAX/MIRALAX) powder Take 17 g by mouth daily. Patient taking differently: Take 17 g by mouth daily as needed for mild constipation.  05/08/18  Yes Enid Derry, Martinique, DO  potassium chloride SA (KLOR-CON) 20 MEQ tablet Take 1 tablet (20 mEq  total) by mouth 2 (two) times daily. Needs appt for further refills Patient taking differently: Take 20 mEq by mouth See admin instructions. Take one tablet (20 meq) by mouth in the morning and at night on the days that you take torsemide (every other day) 09/06/19  Yes Bensimhon, Shaune Pascal, MD  predniSONE (DELTASONE) 10 MG tablet Take 4 tablets (40 mg total) by mouth daily for 4  days. 10/15/19 10/19/19 Yes Cardama, Grayce Sessions, MD  rOPINIRole (REQUIP) 1 MG tablet Take 1 tablet (1 mg total) by mouth daily. Patient taking differently: Take 2 mg by mouth at bedtime.  12/24/13  Yes Collene Gobble, MD  rosuvastatin (CRESTOR) 10 MG tablet Take 10 mg by mouth at bedtime.  08/08/16  Yes [provider]  spironolactone (ALDACTONE) 25 MG tablet Take 1 tablet (25 mg total) by mouth daily. 02/16/16  Yes Shirley Friar, PA-C  torsemide (DEMADEX) 20 MG tablet TAKE 2 TABLETS BY MOUTH  DAILY Patient taking differently: Take 40 mg by mouth every other day.  12/21/18  Yes Bensimhon, Shaune Pascal, MD  traMADol-acetaminophen (ULTRACET) 37.5-325 MG tablet Take 2 tablets by mouth 2 (two) times daily.   Yes [provider]  traZODone (DESYREL) 50 MG tablet Take 12.5 mg by mouth at bedtime.    Yes [provider]    Physical Exam: Vitals:   10/16/19 1603 10/16/19 1630 10/16/19 1633 10/16/19 1915  BP:    138/64  Pulse: 69 70 70 71  Resp: (!) 28 19 14 17   Temp:      SpO2: 97% 99% 95% 95%    Constitutional: NAD, calm, comfortable Vitals:   10/16/19 1603 10/16/19 1630 10/16/19 1633 10/16/19 1915  BP:    138/64  Pulse: 69 70 70 71  Resp: (!) 28 19 14 17   Temp:      SpO2: 97% 99% 95% 95%   Eyes: PERRL, lids and conjunctivae normal ENMT: Mucous membranes are moist. Posterior pharynx clear of any exudate or lesions.Normal dentition.  Neck: normal, supple, no masses, no thyromegaly Respiratory: Diffuse wheezing no crackles.  Increased respiratory effort. No accessory muscle use.    Cardiovascular: Regular rate and rhythm, no murmurs / rubs / gallops. No extremity edema. 2+ pedal pulses. No carotid bruits.  Abdomen: no tenderness, no masses palpated. No hepatosplenomegaly. Bowel sounds positive.  Musculoskeletal: no clubbing / cyanosis. No joint deformity upper and lower extremities. Good ROM, no contractures. Normal muscle tone.  Skin: no rashes, lesions, ulcers. No induration Neurologic: CN 2-12 grossly intact. Sensation intact, DTR normal. Strength 5/5 in all 4.  Psychiatric: Normal judgment and insight. Alert and oriented x 3. Normal mood.     Labs on Admission: I have personally reviewed following labs and imaging studies  CBC: Recent Labs  Lab 10/15/19 0305 10/16/19 1312 10/16/19 1341 10/16/19 1342  WBC 10.0 17.3*  --   --   NEUTROABS 8.7* 15.0*  --   --   HGB 13.2 14.5 14.6 14.3  HCT 40.8 44.7 43.0 42.0  MCV 92.1 92.5  --   --   PLT 131* 226  --   --    Basic Metabolic Panel: Recent Labs  Lab 10/15/19 0305 10/16/19 1312 10/16/19 1341 10/16/19 1342  NA 135 138 139 139  K 4.5 4.0 3.8 3.7  CL 100 103 104  --   CO2 25 17*  --   --   GLUCOSE 133* 142* 155*  --   BUN 18 28* 29*  --   CREATININE 1.38* 1.66* 1.40*  --   CALCIUM 9.4 9.9  --   --    GFR: Estimated Creatinine Clearance: 52.3 mL/min (A) (by C-G formula based on SCr of 1.4 mg/dL (H)). Liver Function Tests: Recent Labs  Lab 10/15/19 0305 10/16/19 1312  AST 21 26  ALT 22 21  ALKPHOS 78 68  BILITOT 1.2 1.2  PROT 7.4 7.4  ALBUMIN  4.2 4.2   Recent Labs  Lab 10/15/19 0305 10/16/19 1312  LIPASE 22 80*   No results for input(s): AMMONIA in the last 168 hours. Coagulation Profile: Recent Labs  Lab 10/16/19 1312  INR 1.3*   Cardiac Enzymes: No results for input(s): CKTOTAL, CKMB, CKMBINDEX, TROPONINI in the last 168 hours. BNP (last 3 results) No results for input(s): PROBNP in the last 8760 hours. HbA1C: No results for input(s): HGBA1C in the last 72 hours. CBG: No  results for input(s): GLUCAP in the last 168 hours. Lipid Profile: No results for input(s): CHOL, HDL, LDLCALC, TRIG, CHOLHDL, LDLDIRECT in the last 72 hours. Thyroid Function Tests: No results for input(s): TSH, T4TOTAL, FREET4, T3FREE, THYROIDAB in the last 72 hours. Anemia Panel: No results for input(s): VITAMINB12, FOLATE, FERRITIN, TIBC, IRON, RETICCTPCT in the last 72 hours. Urine analysis:    Component Value Date/Time   COLORURINE YELLOW 10/16/2019 1823   APPEARANCEUR CLEAR 10/16/2019 1823   LABSPEC 1.035 (H) 10/16/2019 1823   PHURINE 5.0 10/16/2019 1823   GLUCOSEU 50 (A) 10/16/2019 1823   HGBUR NEGATIVE 10/16/2019 1823   BILIRUBINUR NEGATIVE 10/16/2019 1823   KETONESUR NEGATIVE 10/16/2019 1823   PROTEINUR NEGATIVE 10/16/2019 1823   NITRITE NEGATIVE 10/16/2019 1823   LEUKOCYTESUR NEGATIVE 10/16/2019 1823    Radiological Exams on Admission: DG Chest 2 View  Result Date: 10/15/2019 CLINICAL DATA:  Increased shortness of breath and chest pain EXAM: CHEST - 2 VIEW COMPARISON:  Sep 09, 2019 FINDINGS: The heart size and mediastinal contours are within normal limits. The there is a left-sided pacemaker present with the lead tips in the right ventricle. Overlying median sternotomy wires are present. Aortic knob calcifications are present. Both lungs are clear. The visualized skeletal structures are unremarkable. IMPRESSION: No active cardiopulmonary disease. Electronically Signed   By: Prudencio Pair M.D.   On: 10/15/2019 04:49   CT Angio Chest PE W and/or Wo Contrast  Result Date: 10/16/2019 CLINICAL DATA:  Cough and shortness of breath with vomiting. EXAM: CT ANGIOGRAPHY CHEST CT ABDOMEN AND PELVIS WITH CONTRAST TECHNIQUE: Multidetector CT imaging of the chest was performed using the standard protocol during bolus administration of intravenous contrast. Multiplanar CT image reconstructions and MIPs were obtained to evaluate the vascular anatomy. Multidetector CT imaging of the abdomen  and pelvis was performed using the standard protocol during bolus administration of intravenous contrast. CONTRAST:  31mL OMNIPAQUE IOHEXOL 350 MG/ML SOLN COMPARISON:  Sep 09, 2019 FINDINGS: CTA CHEST FINDINGS Cardiovascular: There is moderate severity calcification of the aortic arch. Satisfactory opacification of the pulmonary arteries to the segmental level. No evidence of pulmonary embolism. Normal heart size. No pericardial effusion. Marked severity coronary artery calcification is seen. Mediastinum/Nodes: No enlarged mediastinal, hilar, or axillary lymph nodes. The trachea and esophagus demonstrate no significant findings. Lungs/Pleura: Very mild atelectasis is seen along the medial aspect of the right upper lobe. A stable 6 mm calcified lung nodule is seen within the right upper lobe. A stable 5 mm noncalcified lung nodule is seen along the lateral aspect of the major fissure on the right (axial CT image 81, CT series number 6). There is no evidence of a pleural effusion or pneumothorax. Musculoskeletal: Multilevel degenerative changes seen throughout the thoracic spine. Review of the MIP images confirms the above findings. CT ABDOMEN and PELVIS FINDINGS Hepatobiliary: No focal liver abnormality is seen. No gallstones, gallbladder wall thickening, or biliary dilatation. Pancreas: Unremarkable. No pancreatic ductal dilatation or surrounding inflammatory changes. Spleen: The spleen is mildly  enlarged. Adrenals/Urinary Tract: Adrenal glands are unremarkable. There is marked severity cortical thinning seen along the anterior aspect of the mid and upper right kidney. The left kidney is normal in size. There is no evidence of renal calculi or hydronephrosis. A 1.5 cm cystic appearing area is seen within the posterior aspect of the mid right kidney, with an additional subcentimeter cyst seen within the lower pole of the left kidney. Bladder is unremarkable. Stomach/Bowel: Stomach is within normal limits. The  appendix is not clearly identified. No evidence of bowel wall thickening, distention, or inflammatory changes. Vascular/Lymphatic: There is moderate severity calcification of the abdominal aorta. No enlarged abdominal or pelvic lymph nodes. Reproductive: The prostate gland is mildly enlarged. Other: There is a 3.2 cm x 1.5 cm fat containing umbilical hernia. A 4.0 cm x 2.6 cm fat containing left inguinal hernia is also seen. Musculoskeletal: Degenerative changes are noted throughout the lumbar spine. Review of the MIP images confirms the above findings. IMPRESSION: 1. No evidence of pulmonary embolus. 2. Marked severity coronary artery calcification. 3. Stable 5 mm noncalcified lung nodule along the lateral aspect of the major fissure on the right. 4. Marked severity cortical thinning along the anterior aspect of the mid and upper right kidney. 5. Fat containing umbilical and left inguinal hernias. 6. Mildly enlarged prostate gland. Aortic Atherosclerosis (ICD10-I70.0). Electronically Signed   By: Virgina Norfolk M.D.   On: 10/16/2019 16:46   CT ABDOMEN PELVIS W CONTRAST  Result Date: 10/16/2019 CLINICAL DATA:  Shortness of breath with abdominal pain and neutropenia. EXAM: CT ANGIOGRAPHY CHEST CT ABDOMEN AND PELVIS WITH CONTRAST TECHNIQUE: Multidetector CT imaging of the chest was performed using the standard protocol during bolus administration of intravenous contrast. Multiplanar CT image reconstructions and MIPs were obtained to evaluate the vascular anatomy. Multidetector CT imaging of the abdomen and pelvis was performed using the standard protocol during bolus administration of intravenous contrast. CONTRAST:  20mL OMNIPAQUE IOHEXOL 350 MG/ML SOLN COMPARISON:  None. FINDINGS: CTA CHEST FINDINGS Cardiovascular: There is moderate severity calcification of the aortic arch. Satisfactory opacification of the pulmonary arteries to the segmental level. No evidence of pulmonary embolism. Normal heart size. No  pericardial effusion. Marked severity coronary artery calcification is seen. Mediastinum/Nodes: No enlarged mediastinal, hilar, or axillary lymph nodes. The trachea and esophagus demonstrate no significant findings. Lungs/Pleura: Very mild atelectasis is seen along the medial aspect of the right upper lobe. A stable 6 mm calcified lung nodule is seen within the right upper lobe. A stable 5 mm noncalcified lung nodule is seen along the lateral aspect of the major fissure on the right (axial CT image 81, CT series number 6). There is no evidence of a pleural effusion or pneumothorax. Musculoskeletal: Multilevel degenerative changes seen throughout the thoracic spine. Review of the MIP images confirms the above findings. CT ABDOMEN and PELVIS FINDINGS Hepatobiliary: No focal liver abnormality is seen. No gallstones, gallbladder wall thickening, or biliary dilatation. Pancreas: Unremarkable. No pancreatic ductal dilatation or surrounding inflammatory changes. Spleen: The spleen is mildly enlarged. Adrenals/Urinary Tract: Adrenal glands are unremarkable. There is marked severity cortical thinning seen along the anterior aspect of the mid and upper right kidney. The left kidney is normal in size. There is no evidence of renal calculi or hydronephrosis. A 1.5 cm cystic appearing area is seen within the posterior aspect of the mid right kidney, with an additional subcentimeter cyst seen within the lower pole of the left kidney. Bladder is unremarkable. Stomach/Bowel: Stomach is within normal limits.  The appendix is not clearly identified. No evidence of bowel wall thickening, distention, or inflammatory changes. Vascular/Lymphatic: There is moderate severity calcification of the abdominal aorta. No enlarged abdominal or pelvic lymph nodes. Reproductive: The prostate gland is mildly enlarged. Other: There is a 3.2 cm x 1.5 cm fat containing umbilical hernia. A 4.0 cm x 2.6 cm fat containing left inguinal hernia is also seen.  Musculoskeletal: Degenerative changes are noted throughout the lumbar spine. Review of the MIP images confirms the above findings. IMPRESSION: 1. No evidence of pulmonary embolus. 2. Marked severity coronary artery calcification. 3. Stable 5 mm noncalcified lung nodule along the lateral aspect of the major fissure on the right. 4. Marked severity cortical thinning along the anterior aspect of the mid and upper right kidney. 5. Fat containing umbilical and left inguinal hernias. 6. Mildly enlarged prostate gland. Aortic Atherosclerosis (ICD10-I70.0). Electronically Signed   By: Virgina Norfolk M.D.   On: 10/16/2019 16:47   DG Chest Port 1 View  Result Date: 10/16/2019 CLINICAL DATA:  Of breath x3 days. EXAM: PORTABLE CHEST 1 VIEW COMPARISON:  October 15, 2019 FINDINGS: There is a dual lead AICD. Multiple sternal wires are noted. Mild, diffuse chronic appearing increased lung markings are seen. There is no evidence of acute infiltrate, pleural effusion or pneumothorax. The cardiac silhouette is borderline in size. An artificial cardiac valve is seen. There is mild calcification of the aortic arch. Multilevel degenerative changes seen throughout the thoracic spine. IMPRESSION: Chronic appearing increased lung markings without evidence of acute or active cardiopulmonary disease. Electronically Signed   By: Virgina Norfolk M.D.   On: 10/16/2019 15:04    EKG: Independently reviewed.  Sinus rhythm, borderline prolonged QTC  Assessment/Plan Active Problems:   COPD (chronic obstructive pulmonary disease) (HCC)  (please populate well all problems here in Problem List. (For example, if patient is on BP meds at home and you resume or decide to hold them, it is a problem that needs to be her. Same for CAD, COPD, HLD and so on)  Acute COPD exacerbation -Has already been on Pulmicort, SABA SAMA, despite still wheezing, will add short course p.o. antibiotics plus IV steroid for today and plan to switch to p.o.  steroid tomorrow if symptoms stable -Discussed with pulmonary attending regarding optimization of the patient's COPD medications since patient has had multiple exacerbations this year and seems like poorly controlled overall.  Pulmonary will see patient tomorrow -Consult transition care team for portable O2 supply  Lactic acidosis -Do not see strong indication to escalate his antibiotics given there is no significant infiltrates on x-ray and white count was normal yesterday and 17 today likely because of the steroid use started yesterday -De-escalate antibiotics to p.o. doxycycline to avoid QTC further progression  Paroxysmal A. Fib -On rhythm control medications, discussed with pharmacy will restart Tikosyn renally dosed  AKI - Hydration then recheck.  DVT prophylaxis: Systemic anticoagulation Code Status: Full Code Family Communication: Wife Disposition Plan: Advanced COPD, with restarting Tikosyn likely will need more than 2 midnight hospital stay Consults called: Pulmonary  admission status: Telemetry admission   Lequita Halt MD [facility] Pager [phone]    10/16/2019, 9:14 PM

## 2019-10-16 NOTE — Progress Notes (Signed)
Notified provider and bedside nurse of need to order fluid bolus.   message sent to ordering provider and bedside RN asking to order additional fluid bolus if Md wanted to treat patient off the 2 lactic acid levels resulting roughly one hour prior to sepsis protocol ordered.

## 2019-10-16 NOTE — ED Provider Notes (Signed)
Care assumed at shift change from [place], PA-C, pending CTA chest and CT abd/pelvis. See her note for full HPI and workup. Briefly, pt presenting with resp distress. 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 Seen in the ED yesterday for COPD exacerbation.  . Pt was found to be acidotic. Placed on bipap. Also noted to be anxious which is thought to be contributing. Lactic acidosis is present. With tachypnea, sepsis protocol initiated. vanc and cefepime given. 30cc/kg IVF. Ativan for anxiety. Pending CTA chest and abd. Plan to admit.   ON my evaluation, pt reports symptoms are much improved. He reports his N/V was mostly after his coughing fits, which have increased recently. He states it feels like his previous COPD exacerbations. Physical Exam  BP 119/86   Pulse 70   Temp 97.7 F (36.5 C)   Resp 19   SpO2 100%   Physical Exam Vitals and nursing note reviewed.  Constitutional:      General: He is not in acute distress.    Appearance: He is well-developed.     Comments: Pt is calm  HENT:     Head: Normocephalic and atraumatic.  Eyes:     Conjunctiva/sclera: Conjunctivae normal.  Cardiovascular:     Rate and Rhythm: Normal rate.  Pulmonary:     Effort: Pulmonary effort is normal.     Comments: Wheezes throughout. satting 93% on 2L [place]. Normal effort. Abdominal:     General: Bowel sounds are normal.     Palpations: Abdomen is soft.     Tenderness: There is generalized abdominal tenderness. There is no guarding or rebound.  Skin:    General: Skin is warm.  Neurological:     Mental Status: He is alert.  Psychiatric:        Behavior: Behavior normal.    Results for orders placed or performed during the hospital encounter of 10/16/19  Lactic acid, plasma  Result Value Ref Range   Lactic Acid, Venous 5.8 (HH) 0.5 - 1.9 mmol/L  Lactic acid, plasma  Result Value Ref Range   Lactic Acid, Venous 5.5 (HH) 0.5 - 1.9 mmol/L  Comprehensive metabolic panel  Result Value Ref Range   Sodium 138  135 - 145 mmol/L   Potassium 4.0 3.5 - 5.1 mmol/L   Chloride 103 98 - 111 mmol/L   CO2 17 (L) 22 - 32 mmol/L   Glucose, Bld 142 (H) 70 - 99 mg/dL   BUN 28 (H) 8 - 23 mg/dL   Creatinine, Ser 1.66 (H) 0.61 - 1.24 mg/dL   Calcium 9.9 8.9 - 10.3 mg/dL   Total Protein 7.4 6.5 - 8.1 g/dL   Albumin 4.2 3.5 - 5.0 g/dL   AST 26 15 - 41 U/L   ALT 21 0 - 44 U/L   Alkaline Phosphatase 68 38 - 126 U/L   Total Bilirubin 1.2 0.3 - 1.2 mg/dL   GFR calc non Af Amer 41 (L) >60 mL/min   GFR calc Af Amer 48 (L) >60 mL/min   Anion gap 18 (H) 5 - 15  CBC WITH DIFFERENTIAL  Result Value Ref Range   WBC 17.3 (H) 4.0 - 10.5 K/uL   RBC 4.83 4.22 - 5.81 MIL/uL   Hemoglobin 14.5 13.0 - 17.0 g/dL   HCT 44.7 39 - 52 %   MCV 92.5 80.0 - 100.0 fL   MCH 30.0 26.0 - 34.0 pg   MCHC 32.4 30.0 - 36.0 g/dL   RDW 14.1 11.5 - 15.5 %  Platelets 226 150 - 400 K/uL   nRBC 0.0 0.0 - 0.2 %   Neutrophils Relative % 86 %   Neutro Abs 15.0 (H) 1.7 - 7.7 K/uL   Lymphocytes Relative 6 %   Lymphs Abs 1.0 0.7 - 4.0 K/uL   Monocytes Relative 7 %   Monocytes Absolute 1.2 (H) 0 - 1 K/uL   Eosinophils Relative 0 %   Eosinophils Absolute 0.0 0 - 0 K/uL   Basophils Relative 0 %   Basophils Absolute 0.0 0 - 0 K/uL   Immature Granulocytes 1 %   Abs Immature Granulocytes 0.13 (H) 0.00 - 0.07 K/uL  APTT  Result Value Ref Range   aPTT 29 24 - 36 seconds  Protime-INR  Result Value Ref Range   Prothrombin Time 15.3 (H) 11.4 - 15.2 seconds   INR 1.3 (H) 0.8 - 1.2  D-dimer, quantitative (not at [facility])  Result Value Ref Range   D-Dimer, Quant 1.16 (H) 0.00 - 0.50 ug/mL-FEU  Lipase, blood  Result Value Ref Range   Lipase 80 (H) 11 - 51 U/L  Brain natriuretic peptide  Result Value Ref Range   B Natriuretic Peptide 243.1 (H) 0.0 - 177.9 pg/mL  Salicylate level  Result Value Ref Range   Salicylate Lvl <3.9 (L) 7.0 - 30.0 mg/dL  Acetaminophen level  Result Value Ref Range   Acetaminophen (Tylenol), Serum <10 (L) 10 - 30 ug/mL   I-Stat Chem 8, ED  Result Value Ref Range   Sodium 139 135 - 145 mmol/L   Potassium 3.8 3.5 - 5.1 mmol/L   Chloride 104 98 - 111 mmol/L   BUN 29 (H) 8 - 23 mg/dL   Creatinine, Ser 1.40 (H) 0.61 - 1.24 mg/dL   Glucose, Bld 155 (H) 70 - 99 mg/dL   Calcium, Ion 1.08 (L) 1.15 - 1.40 mmol/L   TCO2 20 (L) 22 - 32 mmol/L   Hemoglobin 14.6 13.0 - 17.0 g/dL   HCT 43.0 39 - 52 %  I-Stat venous blood gas, ED  Result Value Ref Range   pH, Ven 7.536 (H) 7.25 - 7.43   pCO2, Ven 24.7 (L) 44 - 60 mmHg   pO2, Ven 96.0 (H) 32 - 45 mmHg   Bicarbonate 20.9 20.0 - 28.0 mmol/L   TCO2 22 22 - 32 mmol/L   O2 Saturation 98.0 %   Acid-Base Excess 0.0 0.0 - 2.0 mmol/L   Sodium 139 135 - 145 mmol/L   Potassium 3.7 3.5 - 5.1 mmol/L   Calcium, Ion 1.09 (L) 1.15 - 1.40 mmol/L   HCT 42.0 39 - 52 %   Hemoglobin 14.3 13.0 - 17.0 g/dL   Sample type VENOUS   Troponin I (High Sensitivity)  Result Value Ref Range   Troponin I (High Sensitivity) 23 (H) <18 ng/L  Troponin I (High Sensitivity)  Result Value Ref Range   Troponin I (High Sensitivity) 21 (H) <18 ng/L   CT ABDOMEN WO CONTRAST  Result Date: 10/05/2019 CLINICAL DATA:  69 year old male with history of left renal mass. EXAM: CT ABDOMEN WITHOUT CONTRAST TECHNIQUE: Multidetector CT imaging of the abdomen was performed following the standard protocol without IV contrast. COMPARISON:  CTA of the chest, abdomen and pelvis 09/09/2019. FINDINGS: Lower chest: Pacemaker leads in the right atrium and right ventricle. Aortic atherosclerosis. Hepatobiliary: No discrete cystic or solid hepatic lesions are confidently identified on today's noncontrast CT examination. 3 mm calcified gallstone lying dependently in the gallbladder. Gallbladder is nearly decompressed, but otherwise unremarkable  in appearance. Specifically, no evidence of acute cholecystitis at this time. Pancreas: No definite pancreatic mass or peripancreatic fluid collections or inflammatory changes noted on  today's noncontrast CT examination. Spleen: Spleen is enlarged measuring 16.3 x 5.8 x 11.7 cm (estimated splenic volume of 553 mL). Adrenals/Urinary Tract: Previously noted lesion in the interpolar region of the left kidney is barely discernible on today's noncontrast CT examination, estimated to measure approximately 1.4 cm (axial image 66 of series 2). Severe atrophy in the interpolar region and lower pole of the right kidney. No hydroureteronephrosis in the visualized portions of the abdomen. Bilateral adrenal glands are normal in appearance. Stomach/Bowel: Unenhanced appearance of the stomach is normal. No pathologic dilatation of visualized portions of small bowel or colon. Visualized portions of the appendix are normal. Vascular/Lymphatic: Aortic atherosclerosis. No lymphadenopathy noted in the abdomen. Other: Small umbilical hernia containing only omental fat. No significant volume of ascites and no pneumoperitoneum noted in the visualized portions of the peritoneal cavity. Musculoskeletal: There are no aggressive appearing lytic or blastic lesions noted in the visualized portions of the skeleton. IMPRESSION: 1. Previously noted left renal lesion is very poorly evaluated on today's noncontrast CT examination, but estimated to measure approximately 1.4 cm in the interpolar region posteriorly. 2. Cholelithiasis without evidence of acute cholecystitis. 3. Splenomegaly. 4. Small umbilical hernia containing only omental fat. No associated bowel incarceration or obstruction at this time. 5. Aortic atherosclerosis. Electronically Signed   By: Vinnie Langton M.D.   On: 10/05/2019 16:29   DG Chest 2 View  Result Date: 10/15/2019 CLINICAL DATA:  Increased shortness of breath and chest pain EXAM: CHEST - 2 VIEW COMPARISON:  Sep 09, 2019 FINDINGS: The heart size and mediastinal contours are within normal limits. The there is a left-sided pacemaker present with the lead tips in the right ventricle. Overlying median  sternotomy wires are present. Aortic knob calcifications are present. Both lungs are clear. The visualized skeletal structures are unremarkable. IMPRESSION: No active cardiopulmonary disease. Electronically Signed   By: Prudencio Pair M.D.   On: 10/15/2019 04:49   CT Angio Chest PE W and/or Wo Contrast  Result Date: 10/16/2019 CLINICAL DATA:  Cough and shortness of breath with vomiting. EXAM: CT ANGIOGRAPHY CHEST CT ABDOMEN AND PELVIS WITH CONTRAST TECHNIQUE: Multidetector CT imaging of the chest was performed using the standard protocol during bolus administration of intravenous contrast. Multiplanar CT image reconstructions and MIPs were obtained to evaluate the vascular anatomy. Multidetector CT imaging of the abdomen and pelvis was performed using the standard protocol during bolus administration of intravenous contrast. CONTRAST:  23mL OMNIPAQUE IOHEXOL 350 MG/ML SOLN COMPARISON:  Sep 09, 2019 FINDINGS: CTA CHEST FINDINGS Cardiovascular: There is moderate severity calcification of the aortic arch. Satisfactory opacification of the pulmonary arteries to the segmental level. No evidence of pulmonary embolism. Normal heart size. No pericardial effusion. Marked severity coronary artery calcification is seen. Mediastinum/Nodes: No enlarged mediastinal, hilar, or axillary lymph nodes. The trachea and esophagus demonstrate no significant findings. Lungs/Pleura: Very mild atelectasis is seen along the medial aspect of the right upper lobe. A stable 6 mm calcified lung nodule is seen within the right upper lobe. A stable 5 mm noncalcified lung nodule is seen along the lateral aspect of the major fissure on the right (axial CT image 81, CT series number 6). There is no evidence of a pleural effusion or pneumothorax. Musculoskeletal: Multilevel degenerative changes seen throughout the thoracic spine. Review of the MIP images confirms the above findings. CT ABDOMEN  and PELVIS FINDINGS Hepatobiliary: No focal liver  abnormality is seen. No gallstones, gallbladder wall thickening, or biliary dilatation. Pancreas: Unremarkable. No pancreatic ductal dilatation or surrounding inflammatory changes. Spleen: The spleen is mildly enlarged. Adrenals/Urinary Tract: Adrenal glands are unremarkable. There is marked severity cortical thinning seen along the anterior aspect of the mid and upper right kidney. The left kidney is normal in size. There is no evidence of renal calculi or hydronephrosis. A 1.5 cm cystic appearing area is seen within the posterior aspect of the mid right kidney, with an additional subcentimeter cyst seen within the lower pole of the left kidney. Bladder is unremarkable. Stomach/Bowel: Stomach is within normal limits. The appendix is not clearly identified. No evidence of bowel wall thickening, distention, or inflammatory changes. Vascular/Lymphatic: There is moderate severity calcification of the abdominal aorta. No enlarged abdominal or pelvic lymph nodes. Reproductive: The prostate gland is mildly enlarged. Other: There is a 3.2 cm x 1.5 cm fat containing umbilical hernia. A 4.0 cm x 2.6 cm fat containing left inguinal hernia is also seen. Musculoskeletal: Degenerative changes are noted throughout the lumbar spine. Review of the MIP images confirms the above findings. IMPRESSION: 1. No evidence of pulmonary embolus. 2. Marked severity coronary artery calcification. 3. Stable 5 mm noncalcified lung nodule along the lateral aspect of the major fissure on the right. 4. Marked severity cortical thinning along the anterior aspect of the mid and upper right kidney. 5. Fat containing umbilical and left inguinal hernias. 6. Mildly enlarged prostate gland. Aortic Atherosclerosis (ICD10-I70.0). Electronically Signed   By: Virgina Norfolk M.D.   On: 10/16/2019 16:46   CT ABDOMEN PELVIS W CONTRAST  Result Date: 10/16/2019 CLINICAL DATA:  Shortness of breath with abdominal pain and neutropenia. EXAM: CT ANGIOGRAPHY  CHEST CT ABDOMEN AND PELVIS WITH CONTRAST TECHNIQUE: Multidetector CT imaging of the chest was performed using the standard protocol during bolus administration of intravenous contrast. Multiplanar CT image reconstructions and MIPs were obtained to evaluate the vascular anatomy. Multidetector CT imaging of the abdomen and pelvis was performed using the standard protocol during bolus administration of intravenous contrast. CONTRAST:  40mL OMNIPAQUE IOHEXOL 350 MG/ML SOLN COMPARISON:  None. FINDINGS: CTA CHEST FINDINGS Cardiovascular: There is moderate severity calcification of the aortic arch. Satisfactory opacification of the pulmonary arteries to the segmental level. No evidence of pulmonary embolism. Normal heart size. No pericardial effusion. Marked severity coronary artery calcification is seen. Mediastinum/Nodes: No enlarged mediastinal, hilar, or axillary lymph nodes. The trachea and esophagus demonstrate no significant findings. Lungs/Pleura: Very mild atelectasis is seen along the medial aspect of the right upper lobe. A stable 6 mm calcified lung nodule is seen within the right upper lobe. A stable 5 mm noncalcified lung nodule is seen along the lateral aspect of the major fissure on the right (axial CT image 81, CT series number 6). There is no evidence of a pleural effusion or pneumothorax. Musculoskeletal: Multilevel degenerative changes seen throughout the thoracic spine. Review of the MIP images confirms the above findings. CT ABDOMEN and PELVIS FINDINGS Hepatobiliary: No focal liver abnormality is seen. No gallstones, gallbladder wall thickening, or biliary dilatation. Pancreas: Unremarkable. No pancreatic ductal dilatation or surrounding inflammatory changes. Spleen: The spleen is mildly enlarged. Adrenals/Urinary Tract: Adrenal glands are unremarkable. There is marked severity cortical thinning seen along the anterior aspect of the mid and upper right kidney. The left kidney is normal in size. There  is no evidence of renal calculi or hydronephrosis. A 1.5 cm cystic appearing area  is seen within the posterior aspect of the mid right kidney, with an additional subcentimeter cyst seen within the lower pole of the left kidney. Bladder is unremarkable. Stomach/Bowel: Stomach is within normal limits. The appendix is not clearly identified. No evidence of bowel wall thickening, distention, or inflammatory changes. Vascular/Lymphatic: There is moderate severity calcification of the abdominal aorta. No enlarged abdominal or pelvic lymph nodes. Reproductive: The prostate gland is mildly enlarged. Other: There is a 3.2 cm x 1.5 cm fat containing umbilical hernia. A 4.0 cm x 2.6 cm fat containing left inguinal hernia is also seen. Musculoskeletal: Degenerative changes are noted throughout the lumbar spine. Review of the MIP images confirms the above findings. IMPRESSION: 1. No evidence of pulmonary embolus. 2. Marked severity coronary artery calcification. 3. Stable 5 mm noncalcified lung nodule along the lateral aspect of the major fissure on the right. 4. Marked severity cortical thinning along the anterior aspect of the mid and upper right kidney. 5. Fat containing umbilical and left inguinal hernias. 6. Mildly enlarged prostate gland. Aortic Atherosclerosis (ICD10-I70.0). Electronically Signed   By: Virgina Norfolk M.D.   On: 10/16/2019 16:47   DG Chest Port 1 View  Result Date: 10/16/2019 CLINICAL DATA:  Of breath x3 days. EXAM: PORTABLE CHEST 1 VIEW COMPARISON:  October 15, 2019 FINDINGS: There is a dual lead AICD. Multiple sternal wires are noted. Mild, diffuse chronic appearing increased lung markings are seen. There is no evidence of acute infiltrate, pleural effusion or pneumothorax. The cardiac silhouette is borderline in size. An artificial cardiac valve is seen. There is mild calcification of the aortic arch. Multilevel degenerative changes seen throughout the thoracic spine. IMPRESSION: Chronic appearing  increased lung markings without evidence of acute or active cardiopulmonary disease. Electronically Signed   By: Virgina Norfolk M.D.   On: 10/16/2019 15:04   IR Radiologist Eval & Mgmt  Result Date: 10/06/2019 Please refer to notes tab for details about interventional procedure. (Op Note)    ED Course/Procedures     Procedures  MDM  CT's are neg for acute pathology. Pt stabilized, no longer in distress, now on his chronic 2L [place]. Lungs now sound wheezy throughout. Presentation suspected to be respiratory cause, COPD? PT does have new leukocytosis since yesterday, though pt received steroids in the ED and was discharged with prednisone.   Pt admitted to the hospitalist service, Dr. Roosevelt Locks accepting admission.       Rob[place], [place] N, PA-C 10/16/19 1844    Quintella Reichert, MD 10/17/19 0001

## 2019-10-16 NOTE — ED Notes (Signed)
Report attempted 

## 2019-10-16 NOTE — ED Triage Notes (Signed)
Pt BIB for SOB, 3 days and today was the worst. Seen yesterday at [facility]. Today was the worst and wife called. Received 7.5 abuterol, 125 solumederol, 10 mag, 1 epi, 4 zofran, 0.4 nitro. Initally no chest pain but chest pain started with EMS. Cap 14 Pt extremely tachypneic but moving air well, 98% on 3L, always wears 2 at home.

## 2019-10-16 NOTE — ED Provider Notes (Signed)
[place] EMERGENCY DEPARTMENT Provider Note   CSN: [id number] Arrival date & time: 10/16/19  1243     History No chief complaint on file.   [name] is a 69 y.o. male.  The history is provided by medical records and the EMS personnel.   69 year old male presents via EMS with respiratory distress.  There is a level 5 caveat due to respiratory distress.  History is provided by EMS at bedside, EMR.  Respiratory therapy who happened to see the patient yesterday at [facility].  EMS reports that the patient has had vomiting for several days, has been unable to hold down foods and fluid.  He has had several episodes of coughing, shortness of breath and diaphoresis.  His wife called to EMS today.  According to EMS he appeared tight.  He did not have wheezing.  He was treated with 7.5 mg of albuterol, 125 Solu-Medrol, magnesium, patient is on 3 L of oxygen at home due to chronic hypoxic respiratory failure.  He has an extensive past medical history including a sending aortic aneurysm CAD, heart failure,, pulmonary hypertension, persistent A. fib status post [facility] pacemaker, diabetes, and aortic stenosis status post AVR with chronic anticoagulation on Eliquis     Past Medical History:  Diagnosis Date  . Aortic stenosis, mild   . Arthritis of hand    "just a little bit in both hands" (03/31/2012)  . Asthma    "little bit" (03/31/2012)  . Atrial fibrillation [facility])    dx '04; DCCV '04, placed on flecainide, failed DCCV 04/2010, flecainide stopped; s/p successful A.fib ablation 01/31/12  . CHF (congestive heart failure) ([place])   . Diabetes mellitus without complication (HCC)    borderline  . DJD (degenerative joint disease)   . Exertional dyspnea   . Fibromyalgia   . First degree AV block    post ablation, ~462ms  . GERD (gastroesophageal reflux disease)   . Heart murmur   . Hyperlipidemia   . Hypertension   . Hypothyroidism    S/P radiation  . Left  atrial enlargement    LA size 31mm by echo 11/21/11  . Mitral regurgitation    trivial  . Obesity   . Obstructive sleep apnea    mild by sleep study 2013; pt stated he does not have a machine because "it wasn't bad enough for him to have one"  . Pacemaker 03/31/2012  . Restless leg syndrome   . Second degree AV block   . Splenomegaly   . Visit for monitoring Tikosyn therapy 04/16/2016    Patient Active Problem List   Diagnosis Date Noted  . Typical atrial flutter ([place])   . Chronic respiratory failure ([place]) 05/23/2016  . CAD (coronary artery disease) 05/23/2016  . Persistent atrial fibrillation ([place])   . Atypical atrial flutter ([place])   . Visit for monitoring Tikosyn therapy 04/16/2016  . Diastolic heart failure ([place]) 02/03/2016  . Cough 12/06/2015  . Hallucinations 09/04/2015  . Lymphadenopathy 09/04/2015  . Ascending aortic aneurysm ([place]) 09/04/2015  . Hypoxia 09/02/2015  . S/P aortic valve replacement with bioprosthetic valve 09/02/2015  . Pleural effusion 09/02/2015  . S/P AVR 08/22/2015  . GERD (gastroesophageal reflux disease) 09/20/2014  . 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 Dyspnea 09/15/2013  . Pulmonary hypertension ([place]) 09/15/2013  . Shortness of breath 05/05/2013  . 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 Bradycardia 03/31/2012  . Second degree AV block 03/31/2012  . AV block, 1st degree 02/01/2012  .  PAF (paroxysmal atrial fibrillation) ([place]) 11/13/2011  . Fatigue 11/13/2011  . Hypertension 11/13/2011  . Aortic stenosis 11/13/2011  . Sleep apnea 11/13/2011    Past Surgical History:  Procedure Laterality Date  . AORTIC VALVE REPLACEMENT N/A 08/22/2015   Procedure: AORTIC VALVE REPLACEMENT (AVR);  Surgeon: Ivin Poot, MD;  Location: [place];  Service: Open Heart Surgery;  Laterality: N/A;  . ATRIAL FIBRILLATION ABLATION  01/30/2012   PVI by Dr. Rayann Heman  . ATRIAL FIBRILLATION ABLATION N/A 01/31/2012   Procedure: ATRIAL FIBRILLATION ABLATION;  Surgeon: Thompson Grayer, MD;   Location[facility] CATH LAB;  Service: Cardiovascular;  Laterality: N/A;  . BACK SURGERY     X 3  . CARDIAC CATHETERIZATION N/A 08/09/2015   Procedure: Right/Left Heart Cath and Coronary Angiography;  Surgeon: Peter M Martinique, MD;  Location: [place] CV LAB;  Service: Cardiovascular;  Laterality: N/A;  . CARDIAC CATHETERIZATION N/A 02/05/2016   Procedure: Right/Left Heart Cath and Coronary Angiography;  Surgeon: Jettie Booze, MD;  Location: [place] CV LAB;  Service: Cardiovascular;  Laterality: N/A;  . CARDIAC CATHETERIZATION N/A 04/17/2016   Procedure: Right Heart Cath;  Surgeon: Jolaine Artist, MD;  Location: [place] CV LAB;  Service: Cardiovascular;  Laterality: N/A;  . CARDIOVASCULAR STRESS TEST  03/12/2002   EF 48%, NO EVIDENCE OF ISCHEMIA  . CARDIOVERSION  01/2012; 03/31/2012  . CARDIOVERSION N/A 02/25/2012   Procedure: CARDIOVERSION;  Surgeon: Thompson Grayer, MD;  Location: [facility] CATH LAB;  Service: Cardiovascular;  Laterality: N/A;  . CARDIOVERSION N/A 03/31/2012   Procedure: CARDIOVERSION;  Surgeon: Thompson Grayer, MD;  Location: [facility] CATH LAB;  Service: Cardiovascular;  Laterality: N/A;  . CARDIOVERSION N/A 11/23/2015   Procedure: CARDIOVERSION;  Surgeon: Larey Dresser, MD;  Location: [facility] ENDOSCOPY;  Service: Cardiovascular;  Laterality: N/A;  . CARDIOVERSION N/A 04/08/2016   Procedure: CARDIOVERSION;  Surgeon: Jolaine Artist, MD;  Location: [facility] ENDOSCOPY;  Service: Cardiovascular;  Laterality: N/A;  . CARDIOVERSION N/A 09/14/2018   Procedure: CARDIOVERSION;  Surgeon: Josue Hector, MD;  Location: [place];  Service: Cardiovascular;  Laterality: N/A;  . CLIPPING OF ATRIAL APPENDAGE N/A 08/22/2015   Procedure: CLIPPING OF ATRIAL APPENDAGE;  Surgeon: Ivin Poot, MD;  Location: [place];  Service: Open Heart Surgery;  Laterality: N/A;  . DOPPLER ECHOCARDIOGRAPHY  03/11/2002   EF 70-75%  . FINGER SURGERY Left    Middle finger  . FINGER TENDON REPAIR  1980's   "right  little finger" (03/31/2012)  . INSERT / REPLACE / REMOVE PACEMAKER     St Jude  . IR RADIOLOGIST EVAL & MGMT  05/06/2019  . IR RADIOLOGIST EVAL & MGMT  10/06/2019  . LEFT AND RIGHT HEART CATHETERIZATION WITH CORONARY ANGIOGRAM N/A 10/27/2012   Procedure: LEFT AND RIGHT HEART CATHETERIZATION WITH CORONARY ANGIOGRAM;  Surgeon: Laverda Page, MD;  Location: [facility] CATH LAB;  Service: Cardiovascular;  Laterality: N/A;  . LUMBAR DISC SURGERY  1980's X2;  2000's  . MAZE N/A 08/22/2015   Procedure: MAZE;  Surgeon: Ivin Poot, MD;  Location: [place];  Service: Open Heart Surgery;  Laterality: N/A;  . PACEMAKER INSERTION  03/31/2012   STJ Accent DR pacemaker implanted by Dr Rayann Heman  . PERMANENT PACEMAKER INSERTION N/A 03/31/2012   Procedure: PERMANENT PACEMAKER INSERTION;  Surgeon: Thompson Grayer, MD;  Location: [facility] CATH LAB;  Service: Cardiovascular;  Laterality: N/A;  . TEE WITHOUT CARDIOVERSION  01/30/2012   Procedure: TRANSESOPHAGEAL ECHOCARDIOGRAM (TEE);  Surgeon: Larey Dresser,  MD;  Location: [place];  Service: Cardiovascular;  Laterality: N/A;  ablation next day  . TEE WITHOUT CARDIOVERSION N/A 08/22/2015   Procedure: TRANSESOPHAGEAL ECHOCARDIOGRAM (TEE);  Surgeon: Ivin Poot, MD;  Location: [place];  Service: Open Heart Surgery;  Laterality: N/A;  . US ECHOCARDIOGRAPHY  08/07/2009   EF 55-60%       Family History  Problem Relation Age of Onset  . Heart failure Mother   . Stroke Father     Social History   Tobacco Use  . Smoking status: Former Smoker    Packs/day: 0.50    Years: 15.00    Pack years: 7.50    Types: Cigarettes    Quit date: 07/18/1991    Years since quitting: 28.2  . Smokeless tobacco: Never Used  Vaping Use  . Vaping Use: Never used  Substance Use Topics  . Alcohol use: Yes    Comment: occasional  . Drug use: Yes    Types: Marijuana    Comment: 02/2019    Home Medications Prior to Admission medications   Medication Sig Start Date End Date Taking?  Authorizing Provider  albuterol (PROVENTIL HFA;VENTOLIN HFA) 108 (90 Base) MCG/ACT inhaler Inhale 2 puffs into the lungs every 6 (six) hours as needed for wheezing or shortness of breath. 12/28/15  Yes Collene Gobble, MD  amLODipine (NORVASC) 10 MG tablet Take 0.5 tablets (5 mg total) by mouth daily. 09/18/15  Yes Nahser, Wonda Cheng, MD  apixaban (ELIQUIS) 5 MG TABS tablet TAKE 1 TABLET(5 MG) BY MOUTH TWICE DAILY Patient taking differently: Take 5 mg by mouth 2 (two) times daily.  02/25/19  Yes Bensimhon, Shaune Pascal, MD  budesonide-formoterol [facility]) 160-4.5 MCG/ACT inhaler Inhale 2 puffs into the lungs 2 (two) times daily. 11/13/15  Yes Collene Gobble, MD  Cholecalciferol (VITAMIN D) 2000 UNITS CAPS Take 8,000 Units by mouth at bedtime.    Yes [provider]  dofetilide (TIKOSYN) 250 MCG capsule Take 1 capsule (250 mcg total) by mouth 2 (two) times daily. 06/08/19  Yes Sherran Needs, NP  fexofenadine (ALLEGRA) 180 MG tablet Take 1 tablet (180 mg total) by mouth daily. Patient taking differently: Take 180 mg by mouth daily as needed for allergies or rhinitis.  03/01/14  Yes Collene Gobble, MD  gabapentin (NEURONTIN) 300 MG capsule Take 600-1,200 mg by mouth See admin instructions. Take 2 capsules ( 600 mg) by mouth in the morning and  at lunchtime, and take 4 capsules (1,200 mg) at bedtime 09/13/14  Yes [provider]  HYDROcodone-acetaminophen (NORCO/VICODIN) 5-325 MG tablet Take 1 tablet by mouth 2 (two) times daily as needed (pain).    Yes [provider]  ipratropium (ATROVENT HFA) 17 MCG/ACT inhaler Inhale 2 puffs into the lungs 2 (two) times daily.   Yes [provider]  levothyroxine (SYNTHROID, LEVOTHROID) 175 MCG tablet Take 175 mcg by mouth daily before breakfast.   Yes [provider]  MAGNESIUM PO Take 1 tablet by mouth at bedtime.   Yes [provider]  metoprolol tartrate (LOPRESSOR) 50 MG tablet TAKE 1 TABLET BY MOUTH 2  TIMES  DAILY. Patient taking differently: Take 50 mg by mouth 2 (two) times daily.  06/30/19  Yes Larey Dresser, MD  ondansetron (ZOFRAN ODT) 4 MG disintegrating tablet Take 1 tablet (4 mg total) by mouth every 8 (eight) hours as needed for nausea. Patient taking differently: Take 4 mg by mouth 2 (two) times daily.  10/15/19  Yes Cardama,  Grayce Sessions, MD  OXYGEN Inhale 2 L into the lungs continuous.   Yes [provider]  pantoprazole (PROTONIX) 40 MG tablet Take 40 mg by mouth daily.   Yes [provider]  polyethylene glycol powder (GLYCOLAX/MIRALAX) powder Take 17 g by mouth daily. Patient taking differently: Take 17 g by mouth daily as needed for mild constipation.  05/08/18  Yes Enid Derry, [place], DO  potassium chloride SA (KLOR-CON) 20 MEQ tablet Take 1 tablet (20 mEq total) by mouth 2 (two) times daily. Needs appt for further refills Patient taking differently: Take 20 mEq by mouth See admin instructions. Take one tablet (20 meq) by mouth in the morning and at night on the days that you take torsemide (every other day) 09/06/19  Yes Bensimhon, Shaune Pascal, MD  predniSONE (DELTASONE) 10 MG tablet Take 4 tablets (40 mg total) by mouth daily for 4 days. 10/15/19 10/19/19 Yes Cardama, Grayce Sessions, MD  rOPINIRole (REQUIP) 1 MG tablet Take 1 tablet (1 mg total) by mouth daily. Patient taking differently: Take 2 mg by mouth at bedtime.  12/24/13  Yes Collene Gobble, MD  rosuvastatin (CRESTOR) 10 MG tablet Take 10 mg by mouth at bedtime.  08/08/16  Yes [provider]  spironolactone (ALDACTONE) 25 MG tablet Take 1 tablet (25 mg total) by mouth daily. 02/16/16  Yes Shirley Friar, PA-C  torsemide (DEMADEX) 20 MG tablet TAKE 2 TABLETS BY MOUTH  DAILY Patient taking differently: Take 40 mg by mouth every other day.  12/21/18  Yes Bensimhon, Shaune Pascal, MD  traMADol-acetaminophen (ULTRACET) 37.5-325 MG tablet Take 2 tablets by mouth 2 (two) times daily.   Yes [provider]   traZODone (DESYREL) 50 MG tablet Take 12.5 mg by mouth at bedtime.    Yes [provider]    Allergies    Meloxicam and Vancomycin  Review of Systems   Review of Systems  Unable to perform ROS: Acuity of condition    Physical Exam Updated Vital Signs BP 119/86   Pulse 70   Temp 97.7 F (36.5 C)   Resp 19   SpO2 100%   Physical Exam HENT:     Head: Normocephalic and atraumatic.     Mouth/Throat:     Mouth: Mucous membranes are dry.  Eyes:     Extraocular Movements: Extraocular movements intact.     Pupils: Pupils are equal, round, and reactive to light.  Cardiovascular:     Rate and Rhythm: Tachycardia present.  Pulmonary:     Effort: Tachypnea and respiratory distress present. No accessory muscle usage, prolonged expiration or retractions.     Breath sounds: No decreased air movement. No wheezing or rhonchi.     Comments: Patient with respiratory distress, respiratory rate in the 40s, excellent air movement Neurological:     Mental Status: He is alert.     ED Results / Procedures / Treatments   Labs (all labs ordered are listed, but only abnormal results are displayed) Labs Reviewed  LACTIC ACID, PLASMA - Abnormal; Notable for the following components:      Result Value   Lactic Acid, Venous 5.8 (*)    All other components within normal limits  LACTIC ACID, PLASMA - Abnormal; Notable for the following components:   Lactic Acid, Venous 5.5 (*)    All other components within normal limits  COMPREHENSIVE METABOLIC PANEL - Abnormal; Notable for the following components:   CO2 17 (*)    Glucose, Bld 142 (*)  BUN 28 (*)    Creatinine, Ser 1.66 (*)    GFR calc non Af Amer 41 (*)    GFR calc Af Amer 48 (*)    Anion gap 18 (*)    All other components within normal limits  CBC WITH DIFFERENTIAL/PLATELET - Abnormal; Notable for the following components:   WBC 17.3 (*)    Neutro Abs 15.0 (*)    Monocytes Absolute 1.2 (*)    Abs Immature Granulocytes  0.13 (*)    All other components within normal limits  PROTIME-INR - Abnormal; Notable for the following components:   Prothrombin Time 15.3 (*)    INR 1.3 (*)    All other components within normal limits  D-DIMER, QUANTITATIVE (NOT AT [facility]) - Abnormal; Notable for the following components:   D-Dimer, Quant 1.16 (*)    All other components within normal limits  LIPASE, BLOOD - Abnormal; Notable for the following components:   Lipase 80 (*)    All other components within normal limits  BRAIN NATRIURETIC PEPTIDE - Abnormal; Notable for the following components:   B Natriuretic Peptide 243.1 (*)    All other components within normal limits  SALICYLATE LEVEL - Abnormal; Notable for the following components:   Salicylate Lvl <8.3 (*)    All other components within normal limits  ACETAMINOPHEN LEVEL - Abnormal; Notable for the following components:   Acetaminophen (Tylenol), Serum <10 (*)    All other components within normal limits  I-STAT CHEM 8, ED - Abnormal; Notable for the following components:   BUN 29 (*)    Creatinine, Ser 1.40 (*)    Glucose, Bld 155 (*)    Calcium, Ion 1.08 (*)    TCO2 20 (*)    All other components within normal limits  I-STAT VENOUS BLOOD GAS, ED - Abnormal; Notable for the following components:   pH, Ven 7.536 (*)    pCO2, Ven 24.7 (*)    pO2, Ven 96.0 (*)    Calcium, Ion 1.09 (*)    All other components within normal limits  TROPONIN I (HIGH SENSITIVITY) - Abnormal; Notable for the following components:   Troponin I (High Sensitivity) 23 (*)    All other components within normal limits  CULTURE, BLOOD (ROUTINE X 2)  CULTURE, BLOOD (ROUTINE X 2)  URINE CULTURE  APTT  URINALYSIS, ROUTINE W REFLEX MICROSCOPIC  TROPONIN I (HIGH SENSITIVITY)    EKG EKG Interpretation  Date/Time:  Saturday October 16 2019 12:50:28 EDT Ventricular Rate:  70 PR Interval:    QRS Duration: 175 QT Interval:  468 QTC Calculation: 505 R Axis:   -73 Text  Interpretation: Accelerated junctional rhythm Nonspecific IVCD with LAD LVH with secondary repolarization abnormality No significant change since last tracing Confirmed by Deno Etienne [phone]) on 10/16/2019 1:08:32 PM   Radiology DG Chest 2 View  Result Date: 10/15/2019 CLINICAL DATA:  Increased shortness of breath and chest pain EXAM: CHEST - 2 VIEW COMPARISON:  Sep 09, 2019 FINDINGS: The heart size and mediastinal contours are within normal limits. The there is a left-sided pacemaker present with the lead tips in the right ventricle. Overlying median sternotomy wires are present. Aortic knob calcifications are present. Both lungs are clear. The visualized skeletal structures are unremarkable. IMPRESSION: No active cardiopulmonary disease. Electronically Signed   By: Prudencio Pair M.D.   On: 10/15/2019 04:49   DG Chest Port 1 View  Result Date: 10/16/2019 CLINICAL DATA:  Of breath x3 days. EXAM: PORTABLE CHEST 1  VIEW COMPARISON:  October 15, 2019 FINDINGS: There is a dual lead AICD. Multiple sternal wires are noted. Mild, diffuse chronic appearing increased lung markings are seen. There is no evidence of acute infiltrate, pleural effusion or pneumothorax. The cardiac silhouette is borderline in size. An artificial cardiac valve is seen. There is mild calcification of the aortic arch. Multilevel degenerative changes seen throughout the thoracic spine. IMPRESSION: Chronic appearing increased lung markings without evidence of acute or active cardiopulmonary disease. Electronically Signed   By: Virgina Norfolk M.D.   On: 10/16/2019 15:04    Procedures .Critical Care Performed by: Margarita Mail, PA-C Authorized by: Margarita Mail, PA-C   Critical care provider statement:    Critical care time (minutes):  50   Critical care time was exclusive of:  Separately billable procedures and treating other patients   Critical care was necessary to treat or prevent imminent or life-threatening deterioration of  the following conditions:  Sepsis and respiratory failure   Critical care was time spent personally by me on the following activities:  Discussions with consultants, evaluation of patient's response to treatment, examination of patient, ordering and performing treatments and interventions, ordering and review of laboratory studies, ordering and review of radiographic studies, pulse oximetry, re-evaluation of patient's condition, obtaining history from patient or surrogate and review of old charts   (including critical care time)  Medications Ordered in ED Medications  metroNIDAZOLE (FLAGYL) IVPB 500 mg (500 mg Intravenous New Bag/Given 10/16/19 1431)  vancomycin (VANCOREADY) IVPB 1750 mg/350 mL (has no administration in time range)  ceFEPIme (MAXIPIME) 2 g in sodium chloride 0.9 % 100 mL IVPB (2 g Intravenous New Bag/Given 10/16/19 1431)  vancomycin (VANCOREADY) IVPB 750 mg/150 mL (has no administration in time range)  sodium chloride 0.9 % bolus 1,600 mL (has no administration in time range)  sodium chloride 0.9 % bolus 1,000 mL (1,000 mLs Intravenous New Bag/Given 10/16/19 1354)  LORazepam (ATIVAN) injection 1 mg (1 mg Intravenous Given 10/16/19 1423)    ED Course  I have reviewed the triage vital signs and the nursing notes.  Pertinent labs & imaging results that were available during my care of the patient were reviewed by me and considered in my medical decision making (see chart for details).    MDM Rules/Calculators/A&P                          HM:CNOB distress VS: BP 119/86   Pulse 70   Temp 97.7 F (36.5 C)   Resp 19   SpO2 100%  SJ:GGEZMOQ is gathered by wife, patient  and ems. Previous records obtained and reviewed. DDX:The patient's complaint of sob involves an extensive number of diagnostic and treatment options, and is a complaint that carries with it a high risk of complications, morbidity, and potential mortality. Given the large differential diagnosis, medical decision  making is of high complexity. The emergent differential diagnosis for shortness of breath includes, but is not limited to, Pulmonary edema, bronchoconstriction, Pneumonia, Pulmonary embolism, Pneumotherax/ Hemothorax, Dysrythmia, ACS.   Labs: I ordered reviewed and interpreted labs which include Tylenol and salicylate level which appear within normal limits, i-STAT VBG which shows a respiratory alkalosis, plasma lactic acid elevated at 5.5 and likely causing respiratory drive.  Patient is a mildly elevated BNP.  Initial troponin elevated at 23 but this appears same as previous.  Patient's lipase slightly elevated.  Patient also has leukocytosis meeting sepsis criteria and currently on sepsis protocol.  PT/INR mildly elevated.  CMP shows elevated BUN and creatinine just above values from yesterday.  Urine is pending.   Imaging: I ordered and reviewed images which included portable 1 view chest x-ray. I independently visualized and interpreted all imaging.There are no acute, significant findings on today's images.  EKG: Normal sinus rhythm at a rate of 70 Consults: n/a  MDM: Patient here with respiratory distress.  Labs concerning for sepsis and sepsis protocol initiated.  Patient also has a respiratory alkalosis in the setting of lactic acidosis.  Patient's abdomen is tender on examination.  I have ordered a CT pulmonary embolus study and CT abdomen pelvis with contrast.  Patient is currently on BiPAP which is helping him feel more comfortable.  He is usually on 3 L with chronic hypoxic respiratory failure in the setting of pulmonary hypertension.  I discussed the case with Dr. Ralene Bathe and given signout to [place] who will assume care of the patient.`      Final Clinical Impression(s) / ED Diagnoses Final diagnoses:  None    Rx / DC Orders ED Discharge Orders    None       Margarita Mail, PA-C 10/16/19 [place], [place], DO 10/17/19 0725

## 2019-10-16 NOTE — Progress Notes (Signed)
Pharmacy Antibiotic Note  [name] is a 69 y.o. male admitted on 10/16/2019 with sepsis.  Pharmacy has been consulted for Cefepime and Vancomycin dosing.      Temp (24hrs), Avg:97.7 F (36.5 C), Min:97.7 F (36.5 C), Max:97.7 F (36.5 C)  Recent Labs  Lab 10/15/19 0305 10/16/19 1312 10/16/19 1313 10/16/19 1320 10/16/19 1341  WBC 10.0 17.3*  --   --   --   CREATININE 1.38*  --   --   --  1.40*  LATICACIDVEN  --   --  5.8* 5.5*  --     Estimated Creatinine Clearance: 52.3 mL/min (A) (by C-G formula based on SCr of 1.4 mg/dL (H)).    Allergies  Allergen Reactions  . Meloxicam Rash  . 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 syndrome 09/02/15, resolved with diphenhydramine and slowing of rate    Antimicrobials this admission: 6/19 Cefepime >>  6/19 Vancomycin >>   Dose adjustments this admission: N/a  Microbiology results: Pending   Plan:  - Cefepime 2g IV q12h - Vancomycin 1750mg  IV x 1 dose  - Followed by Vancomycin 750mg  IV q12h (nomogram dosing) - Monitor patients renal function and urine output  - De-escalate ABX when appropriate   Thank you for allowing pharmacy to be a part of this patient's care.  Duanne Limerick PharmD. BCPS 10/16/2019 2:17 PM

## 2019-10-17 ENCOUNTER — Inpatient Hospital Stay (HOSPITAL_COMMUNITY): Payer: Medicare Other

## 2019-10-17 DIAGNOSIS — I272 Pulmonary hypertension, unspecified: Secondary | ICD-10-CM

## 2019-10-17 DIAGNOSIS — J441 Chronic obstructive pulmonary disease with (acute) exacerbation: Secondary | ICD-10-CM

## 2019-10-17 DIAGNOSIS — J9611 Chronic respiratory failure with hypoxia: Secondary | ICD-10-CM

## 2019-10-17 DIAGNOSIS — I4819 Other persistent atrial fibrillation: Secondary | ICD-10-CM

## 2019-10-17 LAB — CBC
HCT: 38.7 % — ABNORMAL LOW (ref 39.0–52.0)
Hemoglobin: 11.9 g/dL — ABNORMAL LOW (ref 13.0–17.0)
MCH: 29.5 pg (ref 26.0–34.0)
MCHC: 30.7 g/dL (ref 30.0–36.0)
MCV: 96 fL (ref 80.0–100.0)
Platelets: 147 10*3/uL — ABNORMAL LOW (ref 150–400)
RBC: 4.03 MIL/uL — ABNORMAL LOW (ref 4.22–5.81)
RDW: 14.6 % (ref 11.5–15.5)
WBC: 10.3 10*3/uL (ref 4.0–10.5)
nRBC: 0 % (ref 0.0–0.2)

## 2019-10-17 LAB — URINE CULTURE: Culture: NO GROWTH

## 2019-10-17 LAB — BASIC METABOLIC PANEL
Anion gap: 13 (ref 5–15)
Anion gap: 9 (ref 5–15)
BUN: 28 mg/dL — ABNORMAL HIGH (ref 8–23)
BUN: 27 mg/dL — ABNORMAL HIGH (ref 8–23)
CO2: 20 mmol/L — ABNORMAL LOW (ref 22–32)
CO2: 22 mmol/L (ref 22–32)
Calcium: 8.4 mg/dL — ABNORMAL LOW (ref 8.9–10.3)
Calcium: 8.6 mg/dL — ABNORMAL LOW (ref 8.9–10.3)
Chloride: 106 mmol/L (ref 98–111)
Chloride: 107 mmol/L (ref 98–111)
Creatinine, Ser: 1.54 mg/dL — ABNORMAL HIGH (ref 0.61–1.24)
Creatinine, Ser: 1.3 mg/dL — ABNORMAL HIGH (ref 0.61–1.24)
GFR calc Af Amer: 52 mL/min — ABNORMAL LOW (ref >60)
GFR calc Af Amer: >60 mL/min (ref >60)
GFR calc non Af Amer: 45 mL/min — ABNORMAL LOW (ref >60)
GFR calc non Af Amer: 55 mL/min — ABNORMAL LOW (ref >60)
Glucose, Bld: 182 mg/dL — ABNORMAL HIGH (ref 70–99)
Glucose, Bld: 156 mg/dL — ABNORMAL HIGH (ref 70–99)
Potassium: 4.2 mmol/L (ref 3.5–5.1)
Potassium: 5.2 mmol/L — ABNORMAL HIGH (ref 3.5–5.1)
Sodium: 139 mmol/L (ref 135–145)
Sodium: 138 mmol/L (ref 135–145)

## 2019-10-17 LAB — GLUCOSE, CAPILLARY
Glucose-Capillary: 139 mg/dL — ABNORMAL HIGH (ref 70–99)
Glucose-Capillary: 229 mg/dL — ABNORMAL HIGH (ref 70–99)
Glucose-Capillary: 139 mg/dL — ABNORMAL HIGH (ref 70–99)
Glucose-Capillary: 159 mg/dL — ABNORMAL HIGH (ref 70–99)

## 2019-10-17 LAB — LACTIC ACID, PLASMA: Lactic Acid, Venous: 4 mmol/L (ref 0.5–1.9)

## 2019-10-17 LAB — MAGNESIUM
Magnesium: 2.8 mg/dL — ABNORMAL HIGH (ref 1.7–2.4)
Magnesium: 2.6 mg/dL — ABNORMAL HIGH (ref 1.7–2.4)

## 2019-10-17 LAB — HIV ANTIBODY (ROUTINE TESTING W REFLEX): HIV Screen 4th Generation wRfx: NONREACTIVE

## 2019-10-17 MED ORDER — MOMETASONE FURO-FORMOTEROL FUM 100-5 MCG/ACT IN AERO
2.0000 | INHALATION_SPRAY | Freq: Two times a day (BID) | RESPIRATORY_TRACT | Status: DC
  Administered 2019-10-17 – 2019-10-20 (×5): 2 via RESPIRATORY_TRACT
  Filled 2019-10-17: qty 8.8

## 2019-10-17 MED ORDER — PANTOPRAZOLE SODIUM 40 MG PO TBEC
40.0000 mg | DELAYED_RELEASE_TABLET | Freq: Two times a day (BID) | ORAL | Status: DC
  Administered 2019-10-17 – 2019-10-20 (×6): 40 mg via ORAL
  Filled 2019-10-17 (×6): qty 1

## 2019-10-17 MED ORDER — GUAIFENESIN ER 600 MG PO TB12
1200.0000 mg | ORAL_TABLET | Freq: Two times a day (BID) | ORAL | Status: DC
  Administered 2019-10-17: 1200 mg via ORAL
  Filled 2019-10-17: qty 2

## 2019-10-17 MED ORDER — METHYLPREDNISOLONE SODIUM SUCC 125 MG IJ SOLR
80.0000 mg | Freq: Two times a day (BID) | INTRAMUSCULAR | Status: DC
  Administered 2019-10-18 – 2019-10-19 (×4): 80 mg via INTRAVENOUS
  Filled 2019-10-17 (×4): qty 2

## 2019-10-17 MED ORDER — BISOPROLOL FUMARATE 5 MG PO TABS
5.0000 mg | ORAL_TABLET | Freq: Every day | ORAL | Status: DC
  Administered 2019-10-18 – 2019-10-20 (×3): 5 mg via ORAL
  Filled 2019-10-17 (×3): qty 1

## 2019-10-17 NOTE — Assessment & Plan Note (Signed)
See echo 11/05/17  MIld RVE PAS 35 with good ef but high LV filling pressures  C/w WHO III pulmonary hypertension:  rx is to treat the bp/ keep 02 up > 90% at all times > f/u Dr Lamonte Sakai as outpt

## 2019-10-17 NOTE — ED Provider Notes (Signed)
.  Foreign Body Removal  Date/Time: 10/17/2019 7:23 AM Performed by: Deno Etienne, DO Authorized by: Deno Etienne, DO  Body area: skin General location: lower extremity Location details: left lower leg  Sedation: Patient sedated: no  Patient restrained: no Patient cooperative: yes Localization method: visualized Removal mechanism: forceps Depth: subcutaneous Complexity: simple 1 objects recovered. Objects recovered: tick Post-procedure assessment: foreign body removed Patient tolerance: patient tolerated the procedure well with no immediate complications      Deno Etienne, DO 10/17/19 6962

## 2019-10-17 NOTE — Evaluation (Signed)
Physical Therapy Evaluation Patient Details Name: [name] MRN: [id number] DOB: [birth year] Today's Date: 10/17/2019   History of Present Illness  69 year old male who presented to ED 10/16/19 with shortness of breath, wheezing, and cough for 3 days resulting in COPD exacerbation. Wears 2L o2 at night. PMH afib, AKI, hypothyroidism, HTN DM, DJD, splenomegaly.  Clinical Impression  Pt presents with no overall decrease in functional mobility secondary to above. PTA, pt retired and active, living with wife. Today pt independent with bed mobility, transfers and amb on 2L o2. Pt educated on pulmonary rehab, which he reported he had completed before and would complete if he felt he needed it. Pt is independent from mobility stand point and can discharge from PT acutely.  Spo2 >92% on 2L o2 by     Follow Up Recommendations No PT follow up    Equipment Recommendations  None recommended by PT    Recommendations for Other Services       Precautions / Restrictions Precautions Precautions: None      Mobility  Bed Mobility Overal bed mobility: Independent                Transfers Overall transfer level: Independent                  Ambulation/Gait Ambulation/Gait assistance: Independent Gait Distance (Feet): 400 Feet Assistive device: None Gait Pattern/deviations: WFL(Within Functional Limits);Wide base of support     General Gait Details: Pt able to amb without assistance, only required assist for IV pole. Pt o2 remianed >92% on 2L o2 with amb  Stairs            Wheelchair Mobility    Modified Rankin (Stroke Patients Only)       Balance Overall balance assessment: No apparent balance deficits (not formally assessed)                                           Pertinent Vitals/Pain Pain Assessment: No/denies pain    Home Living Family/patient expects to be discharged to:: Private residence Living Arrangements:  Spouse/significant other Available Help at Discharge: Family;Available PRN/intermittently Type of Home: House Home Access: Stairs to enter;Ramped entrance Entrance Stairs-Rails: Can reach both Entrance Stairs-Number of Steps: 5 Home Layout: One level Home Equipment: None      Prior Function Level of Independence: Independent         Comments: Likes to fish, play with dogs and be outdoors, is retired     Journalist, newspaper        Extremity/Trunk Assessment   Upper Extremity Assessment Upper Extremity Assessment: Overall WFL for tasks assessed    Lower Extremity Assessment Lower Extremity Assessment: Overall WFL for tasks assessed    Cervical / Trunk Assessment Cervical / Trunk Assessment: Kyphotic  Communication   Communication: No difficulties  Cognition Arousal/Alertness: Awake/alert Behavior During Therapy: WFL for tasks assessed/performed Overall Cognitive Status: Within Functional Limits for tasks assessed                                        General Comments General comments (skin integrity, edema, etc.): Pt family in room and supportive, adamant that PT watch O2 levels during amb. Educated on pulmonary rehab and pt reported he has done that before and will look into  it if he feels he needs it.    Exercises Other Exercises Other Exercises: 11x sit to stand with 30 sec sit to stand   Assessment/Plan    PT Assessment Patent does not need any further PT services  PT Problem List         PT Treatment Interventions      PT Goals (Current goals can be found in the Care Plan section)  Acute Rehab PT Goals PT Goal Formulation: All assessment and education complete, DC therapy    Frequency     Barriers to discharge        Co-evaluation               AM-PAC PT "6 Clicks" Mobility  Outcome Measure Help needed turning from your back to your side while in a flat bed without using bedrails?: None Help needed moving from lying on  your back to sitting on the side of a flat bed without using bedrails?: None Help needed moving to and from a bed to a chair (including a wheelchair)?: None Help needed standing up from a chair using your arms (e.g., wheelchair or bedside chair)?: None Help needed to walk in hospital room?: None Help needed climbing 3-5 steps with a railing? : None 6 Click Score: 24    End of Session Equipment Utilized During Treatment: Oxygen;Gait belt Activity Tolerance: Patient tolerated treatment well Patient left: in bed;with family/visitor present;with call bell/phone within reach Nurse Communication: Mobility status      Time: 3013-1438 PT Time Calculation (min) (ACUTE ONLY): 16 min   Charges:   PT Evaluation $PT Eval Moderate Complexity: 1 Mod          Tyniesha Howald SPT 10/17/2019   Rolland Porter 10/17/2019, 4:58 PM

## 2019-10-17 NOTE — Consult Note (Signed)
Cardiology Consultation:   Patient ID: [name] MRN: [id number]; DOB: Aug 29, 1950  Admit date: 10/16/2019 Date of Consult: 10/17/2019  Primary Care Provider: Velna Hatchet, MD [facility] Cardiologist: Bemsimhon/Nahser [facility] Electrophysiologist:  Thompson Grayer, MD    Patient Profile:   [name] is a 69 y.o. male with a hx of persistent atrial fibrillation, hypertension, complete heart block status post pacemaker who is being seen today for the evaluation of atrial fibrillation at the request of Binaya Dahal.  History of Present Illness:   [name] is a 69 year old male who presented to the hospital with shortness of breath, wheezing, and cough for 3 days.  He also had poor p.o. intake.  He has a history of COPD and is usually on 2 L at night.  He has been having increasing shortness of breath and wheezing.  He has no abdominal pain or diarrhea.  He presented to the emergency room with his respiratory status improved on BiPAP.  A lactate of 5.5 improved to 3.5 after IV medications.  He has a history of atrial fibrillation and is on dofetilide.  He had apparently been off of this medication for a few days.  He [name] need a 3-day restart.   Past Medical History:  Diagnosis Date  . Aortic stenosis, mild   . Arthritis of hand    "just a little bit in both hands" (03/31/2012)  . Asthma    "little bit" (03/31/2012)  . Atrial fibrillation [facility])    dx '04; DCCV '04, placed on flecainide, failed DCCV 04/2010, flecainide stopped; s/p successful A.fib ablation 01/31/12  . CHF (congestive heart failure) ([place])   . Diabetes mellitus without complication (HCC)    borderline  . DJD (degenerative joint disease)   . Exertional dyspnea   . Fibromyalgia   . First degree AV block    post ablation, ~480ms  . GERD (gastroesophageal reflux disease)   . Heart murmur   . Hyperlipidemia   . Hypertension   . Hypothyroidism    S/P radiation  . Left atrial enlargement    LA  size 73mm by echo 11/21/11  . Mitral regurgitation    trivial  . Obesity   . Obstructive sleep apnea    mild by sleep study 2013; pt stated he does not have a machine because "it wasn't bad enough for him to have one"  . Pacemaker 03/31/2012  . Restless leg syndrome   . Second degree AV block   . Splenomegaly   . Visit for monitoring Tikosyn therapy 04/16/2016    Past Surgical History:  Procedure Laterality Date  . AORTIC VALVE REPLACEMENT N/A 08/22/2015   Procedure: AORTIC VALVE REPLACEMENT (AVR);  Surgeon: Ivin Poot, MD;  Location: [place];  Service: Open Heart Surgery;  Laterality: N/A;  . ATRIAL FIBRILLATION ABLATION  01/30/2012   PVI by Dr. Rayann Heman  . ATRIAL FIBRILLATION ABLATION N/A 01/31/2012   Procedure: ATRIAL FIBRILLATION ABLATION;  Surgeon: Thompson Grayer, MD;  Location: [facility] CATH LAB;  Service: Cardiovascular;  Laterality: N/A;  . BACK SURGERY     X 3  . CARDIAC CATHETERIZATION N/A 08/09/2015   Procedure: Right/Left Heart Cath and Coronary Angiography;  Surgeon: Peter M Martinique, MD;  Location: [place] CV LAB;  Service: Cardiovascular;  Laterality: N/A;  . CARDIAC CATHETERIZATION N/A 02/05/2016   Procedure: Right/Left Heart Cath and Coronary Angiography;  Surgeon: Jettie Booze, MD;  Location: [place] CV LAB;  Service: Cardiovascular;  Laterality: N/A;  . CARDIAC  CATHETERIZATION N/A 04/17/2016   Procedure: Right Heart Cath;  Surgeon: Jolaine Artist, MD;  Location: [place] CV LAB;  Service: Cardiovascular;  Laterality: N/A;  . CARDIOVASCULAR STRESS TEST  03/12/2002   EF 48%, NO EVIDENCE OF ISCHEMIA  . CARDIOVERSION  01/2012; 03/31/2012  . CARDIOVERSION N/A 02/25/2012   Procedure: CARDIOVERSION;  Surgeon: Thompson Grayer, MD;  Location: [facility] CATH LAB;  Service: Cardiovascular;  Laterality: N/A;  . CARDIOVERSION N/A 03/31/2012   Procedure: CARDIOVERSION;  Surgeon: Thompson Grayer, MD;  Location: [facility] CATH LAB;  Service: Cardiovascular;  Laterality: N/A;  . CARDIOVERSION  N/A 11/23/2015   Procedure: CARDIOVERSION;  Surgeon: Larey Dresser, MD;  Location: [facility] ENDOSCOPY;  Service: Cardiovascular;  Laterality: N/A;  . CARDIOVERSION N/A 04/08/2016   Procedure: CARDIOVERSION;  Surgeon: Jolaine Artist, MD;  Location: [facility] ENDOSCOPY;  Service: Cardiovascular;  Laterality: N/A;  . CARDIOVERSION N/A 09/14/2018   Procedure: CARDIOVERSION;  Surgeon: Josue Hector, MD;  Location: [place];  Service: Cardiovascular;  Laterality: N/A;  . CLIPPING OF ATRIAL APPENDAGE N/A 08/22/2015   Procedure: CLIPPING OF ATRIAL APPENDAGE;  Surgeon: Ivin Poot, MD;  Location: [place];  Service: Open Heart Surgery;  Laterality: N/A;  . DOPPLER ECHOCARDIOGRAPHY  03/11/2002   EF 70-75%  . FINGER SURGERY Left    Middle finger  . FINGER TENDON REPAIR  1980's   "right little finger" (03/31/2012)  . INSERT / REPLACE / REMOVE PACEMAKER     [employer]  . IR RADIOLOGIST EVAL & MGMT  05/06/2019  . IR RADIOLOGIST EVAL & MGMT  10/06/2019  . LEFT AND RIGHT HEART CATHETERIZATION WITH CORONARY ANGIOGRAM N/A 10/27/2012   Procedure: LEFT AND RIGHT HEART CATHETERIZATION WITH CORONARY ANGIOGRAM;  Surgeon: Laverda Page, MD;  Location: [facility] CATH LAB;  Service: Cardiovascular;  Laterality: N/A;  . LUMBAR DISC SURGERY  1980's X2;  2000's  . MAZE N/A 08/22/2015   Procedure: MAZE;  Surgeon: Ivin Poot, MD;  Location: [place];  Service: Open Heart Surgery;  Laterality: N/A;  . PACEMAKER INSERTION  03/31/2012   STJ Accent DR pacemaker implanted by Dr Rayann Heman  . PERMANENT PACEMAKER INSERTION N/A 03/31/2012   Procedure: PERMANENT PACEMAKER INSERTION;  Surgeon: Thompson Grayer, MD;  Location: [facility] CATH LAB;  Service: Cardiovascular;  Laterality: N/A;  . TEE WITHOUT CARDIOVERSION  01/30/2012   Procedure: TRANSESOPHAGEAL ECHOCARDIOGRAM (TEE);  Surgeon: Larey Dresser, MD;  Location: [place];  Service: Cardiovascular;  Laterality: N/A;  ablation next day  . TEE WITHOUT CARDIOVERSION N/A 08/22/2015   Procedure:  TRANSESOPHAGEAL ECHOCARDIOGRAM (TEE);  Surgeon: Ivin Poot, MD;  Location: [place];  Service: Open Heart Surgery;  Laterality: N/A;  . US ECHOCARDIOGRAPHY  08/07/2009   EF 55-60%     Home Medications:  Prior to Admission medications   Medication Sig Start Date End Date Taking? Authorizing Provider  albuterol (PROVENTIL HFA;VENTOLIN HFA) 108 (90 Base) MCG/ACT inhaler Inhale 2 puffs into the lungs every 6 (six) hours as needed for wheezing or shortness of breath. 12/28/15  Yes Collene Gobble, MD  amLODipine (NORVASC) 10 MG tablet Take 0.5 tablets (5 mg total) by mouth daily. 09/18/15  Yes Nahser, Wonda Cheng, MD  apixaban (ELIQUIS) 5 MG TABS tablet TAKE 1 TABLET(5 MG) BY MOUTH TWICE DAILY Patient taking differently: Take 5 mg by mouth 2 (two) times daily.  02/25/19  Yes Bensimhon, Shaune Pascal, MD  budesonide-formoterol [facility]) 160-4.5 MCG/ACT inhaler Inhale 2 puffs into the lungs 2 (two) times daily. 11/13/15  Yes Collene Gobble, MD  Cholecalciferol (VITAMIN D) 2000 UNITS CAPS Take 8,000 Units by mouth at bedtime.    Yes [provider]  dofetilide (TIKOSYN) 250 MCG capsule Take 1 capsule (250 mcg total) by mouth 2 (two) times daily. 06/08/19  Yes Sherran Needs, NP  fexofenadine (ALLEGRA) 180 MG tablet Take 1 tablet (180 mg total) by mouth daily. Patient taking differently: Take 180 mg by mouth daily as needed for allergies or rhinitis.  03/01/14  Yes Collene Gobble, MD  gabapentin (NEURONTIN) 300 MG capsule Take 600-1,200 mg by mouth See admin instructions. Take 2 capsules ( 600 mg) by mouth in the morning and  at lunchtime, and take 4 capsules (1,200 mg) at bedtime 09/13/14  Yes [provider]  HYDROcodone-acetaminophen (NORCO/VICODIN) 5-325 MG tablet Take 1 tablet by mouth 2 (two) times daily as needed (pain).    Yes [provider]  ipratropium (ATROVENT HFA) 17 MCG/ACT inhaler Inhale 2 puffs into the lungs 2 (two) times daily.   Yes [provider]    levothyroxine (SYNTHROID, LEVOTHROID) 175 MCG tablet Take 175 mcg by mouth daily before breakfast.   Yes [provider]  MAGNESIUM PO Take 1 tablet by mouth at bedtime.   Yes [provider]  metoprolol tartrate (LOPRESSOR) 50 MG tablet TAKE 1 TABLET BY MOUTH 2  TIMES DAILY. Patient taking differently: Take 50 mg by mouth 2 (two) times daily.  06/30/19  Yes Larey Dresser, MD  ondansetron (ZOFRAN ODT) 4 MG disintegrating tablet Take 1 tablet (4 mg total) by mouth every 8 (eight) hours as needed for nausea. Patient taking differently: Take 4 mg by mouth 2 (two) times daily.  10/15/19  Yes Cardama, Grayce Sessions, MD  OXYGEN Inhale 2 L into the lungs continuous.   Yes [provider]  pantoprazole (PROTONIX) 40 MG tablet Take 40 mg by mouth daily.   Yes [provider]  polyethylene glycol powder (GLYCOLAX/MIRALAX) powder Take 17 g by mouth daily. Patient taking differently: Take 17 g by mouth daily as needed for mild constipation.  05/08/18  Yes Enid Derry, Martinique, DO  potassium chloride SA (KLOR-CON) 20 MEQ tablet Take 1 tablet (20 mEq total) by mouth 2 (two) times daily. Needs appt for further refills Patient taking differently: Take 20 mEq by mouth See admin instructions. Take one tablet (20 meq) by mouth in the morning and at night on the days that you take torsemide (every other day) 09/06/19  Yes Bensimhon, Shaune Pascal, MD  predniSONE (DELTASONE) 10 MG tablet Take 4 tablets (40 mg total) by mouth daily for 4 days. 10/15/19 10/19/19 Yes Cardama, Grayce Sessions, MD  rOPINIRole (REQUIP) 1 MG tablet Take 1 tablet (1 mg total) by mouth daily. Patient taking differently: Take 2 mg by mouth at bedtime.  12/24/13  Yes Collene Gobble, MD  rosuvastatin (CRESTOR) 10 MG tablet Take 10 mg by mouth at bedtime.  08/08/16  Yes [provider]  spironolactone (ALDACTONE) 25 MG tablet Take 1 tablet (25 mg total) by mouth daily. 02/16/16  Yes Shirley Friar, PA-C   torsemide (DEMADEX) 20 MG tablet TAKE 2 TABLETS BY MOUTH  DAILY Patient taking differently: Take 40 mg by mouth every other day.  12/21/18  Yes Bensimhon, Shaune Pascal, MD  traMADol-acetaminophen (ULTRACET) 37.5-325 MG tablet Take 2 tablets by mouth 2 (two) times daily.   Yes [provider]  traZODone (DESYREL) 50 MG tablet Take 12.5 mg by mouth at bedtime.  Yes [provider]    Inpatient Medications: Scheduled Meds: . amLODipine  5 mg Oral Daily  . apixaban  5 mg Oral BID  . cholecalciferol  8,000 Units Oral QHS  . dofetilide  250 mcg Oral BID  . doxycycline  100 mg Oral Q12H  . fluticasone furoate-vilanterol  1 puff Inhalation Daily  . gabapentin  1,200 mg Oral QHS  . gabapentin  600 mg Oral BID WC  . guaiFENesin  1,200 mg Oral BID  . insulin aspart  0-5 Units Subcutaneous QHS  . insulin aspart  0-9 Units Subcutaneous TID WC  . ipratropium-albuterol  3 mL Nebulization Q6H  . levothyroxine  175 mcg Oral QAC breakfast  . loratadine  10 mg Oral Daily  . methylPREDNISolone (SOLU-MEDROL) injection  40 mg Intravenous Q6H  . metoprolol tartrate  50 mg Oral BID  . pantoprazole  40 mg Oral Daily  . rOPINIRole  2 mg Oral QHS  . rosuvastatin  10 mg Oral QHS  . spironolactone  25 mg Oral Daily  . traMADol  50 mg Oral BID  . traZODone  25 mg Oral QHS   Continuous Infusions: . sodium chloride 125 mL/hr at 10/16/19 2210   PRN Meds: guaiFENesin, HYDROcodone-acetaminophen, ipratropium-albuterol, ondansetron, polyethylene glycol powder  Allergies:    Allergies  Allergen Reactions  . Meloxicam Rash  . 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 syndrome 09/02/15, resolved with diphenhydramine and slowing of rate    Social History:   Social History   Socioeconomic History  . Marital status: Married    Spouse name: Not on file  . Number of children: Not on file  . Years of education: Not on file  . Highest education level: Not on file  Occupational History  .  Not on file  Tobacco Use  . Smoking status: Former Smoker    Packs/day: 0.50    Years: 15.00    Pack years: 7.50    Types: Cigarettes    Quit date: 07/18/1991    Years since quitting: 28.2  . Smokeless tobacco: Never Used  Vaping Use  . Vaping Use: Never used  Substance and Sexual Activity  . Alcohol use: Yes    Comment: occasional  . Drug use: Yes    Types: Marijuana    Comment: 02/2019  . Sexual activity: Not Currently  Other Topics Concern  . Not on file  Social History Narrative   Lives in [place] with spouse.  Works as a Air traffic controller.   Social Determinants of Health   Financial Resource Strain:   . Difficulty of Paying Living Expenses:   Food Insecurity:   . Worried About Charity fundraiser in the Last Year:   . Arboriculturist in the Last Year:   Transportation Needs:   . Film/video editor (Medical):   [name] Lack of Transportation (Non-Medical):   Physical Activity:   . Days of Exercise per Week:   . Minutes of Exercise per Session:   Stress:   . Feeling of Stress :   Social Connections:   . Frequency of Communication with Friends and Family:   . Frequency of Social Gatherings with Friends and Family:   . Attends Religious Services:   . Active Member of Clubs or Organizations:   . Attends Archivist Meetings:   [name] Marital Status:   Intimate Partner Violence:   . Fear of Current or Ex-Partner:   . Emotionally Abused:   .  Physically Abused:   . Sexually Abused:     Family History:    Family History  Problem Relation Age of Onset  . Heart failure Mother   . Stroke Father      ROS:  Please see the history of present illness.   All other ROS reviewed and negative.     Physical Exam/Data:   Vitals:   10/16/19 2205 10/16/19 2300 10/17/19 0733 10/17/19 0810  BP: 136/72 136/75 125/63   Pulse: 70 71 77   Resp:  18 16   Temp:  97.7 F (36.5 C) 98.7 F (37.1 C)   TempSrc:  Oral Oral   SpO2:  98% 100% 97%  Weight:  85.6 kg       Intake/Output Summary (Last 24 hours) at 10/17/2019 1013 Last data filed at 10/17/2019 0500 Gross per 24 hour  Intake 851.52 ml  Output --  Net 851.52 ml   Last 3 Weights 10/16/2019 10/15/2019 09/09/2019  Weight (lbs) 188 lb 11.4 oz 190 lb 190 lb  Weight (kg) 85.6 kg 86.183 kg 86.183 kg     Body mass index is 26.32 kg/m.  General:  Well nourished, well developed, in no acute distress HEENT: normal Lymph: no adenopathy Neck: no JVD Endocrine:  No thryomegaly Vascular: No carotid bruits; FA pulses 2+ bilaterally without bruits  Cardiac:  normal S1, S2; RRR; no murmur  Lungs:  clear to auscultation bilaterally, no wheezing, rhonchi or rales  Abd: soft, nontender, no hepatomegaly  Ext: no edema Musculoskeletal:  No deformities, BUE and BLE strength normal and equal Skin: warm and dry  Neuro:  CNs 2-12 intact, no focal abnormalities noted Psych:  Normal affect   EKG:  The EKG was personally reviewed and demonstrates: Atrial fibrillation, ventricular paced Telemetry:  Telemetry was personally reviewed and demonstrates:  AF, V paced  Relevant CV Studies: TTE 2019 - Left ventricle: The cavity size was normal. Wall thickness was  increased in a pattern of mild LVH. Systolic function was normal.  The estimated ejection fraction was in the range of 60% to 65%.  Wall motion was normal; there were no regional wall motion  abnormalities. Doppler parameters are consistent with high  ventricular filling pressure.  - Aortic valve: A bioprosthesis was present. There was trivial  regurgitation. Valve area (VTI): 2.43 cm^2. Valve area (Vmax):  2.36 cm^2. Valve area (Vmean): 2.47 cm^2.  - Mitral valve: Calcified annulus.  - Right ventricle: The cavity size was mildly dilated.  - Pulmonary arteries: PA peak pressure: 35 mm Hg (S).   Laboratory Data:  High Sensitivity Troponin:   Recent Labs  Lab 10/16/19 1312 10/16/19 1514  TROPONINIHS 23* 21*     Chemistry Recent  Labs  Lab 10/16/19 1312 10/16/19 1312 10/16/19 1341 10/16/19 1341 10/16/19 1342 10/16/19 2250 10/17/19 0653  NA 138   < > 139   < > 139 139 138  K 4.0   < > 3.8   < > 3.7 4.2 5.2*  CL 103   < > 104  --   --  106 107  CO2 17*  --   --   --   --  20* 22  GLUCOSE 142*   < > 155*  --   --  182* 156*  BUN 28*   < > 29*  --   --  28* 27*  CREATININE 1.66*   < > 1.40*  --   --  1.54* 1.30*  CALCIUM 9.9  --   --   --   --  8.4* 8.6*  GFRNONAA 41*  --   --   --   --  45* 55*  GFRAA 48*  --   --   --   --  52* >60  ANIONGAP 18*  --   --   --   --  13 9   < > = values in this interval not displayed.    Recent Labs  Lab 10/15/19 0305 10/16/19 1312  PROT 7.4 7.4  ALBUMIN 4.2 4.2  AST 21 26  ALT 22 21  ALKPHOS 78 68  BILITOT 1.2 1.2   Hematology Recent Labs  Lab 10/15/19 0305 10/15/19 0305 10/16/19 1312 10/16/19 1312 10/16/19 1341 10/16/19 1342 10/17/19 0653  WBC 10.0  --  17.3*  --   --   --  10.3  RBC 4.43  --  4.83  --   --   --  4.03*  HGB 13.2   < > 14.5   < > 14.6 14.3 11.9*  HCT 40.8   < > 44.7   < > 43.0 42.0 38.7*  MCV 92.1  --  92.5  --   --   --  96.0  MCH 29.8  --  30.0  --   --   --  29.5  MCHC 32.4  --  32.4  --   --   --  30.7  RDW 13.8  --  14.1  --   --   --  14.6  PLT 131*  --  226  --   --   --  147*   < > = values in this interval not displayed.   BNP Recent Labs  Lab 10/15/19 0305 10/16/19 1312  BNP 228.2* 243.1*    DDimer  Recent Labs  Lab 10/16/19 1312  DDIMER 1.16*     Radiology/Studies:  DG Chest 2 View  Result Date: 10/15/2019 CLINICAL DATA:  Increased shortness of breath and chest pain EXAM: CHEST - 2 VIEW COMPARISON:  Sep 09, 2019 FINDINGS: The heart size and mediastinal contours are within normal limits. The there is a left-sided pacemaker present with the lead tips in the right ventricle. Overlying median sternotomy wires are present. Aortic knob calcifications are present. Both lungs are clear. The visualized skeletal structures  are unremarkable. IMPRESSION: No active cardiopulmonary disease. Electronically Signed   By: Prudencio Pair M.D.   On: 10/15/2019 04:49   CT Angio Chest PE W and/or Wo Contrast  Result Date: 10/16/2019 CLINICAL DATA:  Cough and shortness of breath with vomiting. EXAM: CT ANGIOGRAPHY CHEST CT ABDOMEN AND PELVIS WITH CONTRAST TECHNIQUE: Multidetector CT imaging of the chest was performed using the standard protocol during bolus administration of intravenous contrast. Multiplanar CT image reconstructions and MIPs were obtained to evaluate the vascular anatomy. Multidetector CT imaging of the abdomen and pelvis was performed using the standard protocol during bolus administration of intravenous contrast. CONTRAST:  19mL OMNIPAQUE IOHEXOL 350 MG/ML SOLN COMPARISON:  Sep 09, 2019 FINDINGS: CTA CHEST FINDINGS Cardiovascular: There is moderate severity calcification of the aortic arch. Satisfactory opacification of the pulmonary arteries to the segmental level. No evidence of pulmonary embolism. Normal heart size. No pericardial effusion. Marked severity coronary artery calcification is seen. Mediastinum/Nodes: No enlarged mediastinal, hilar, or axillary lymph nodes. The trachea and esophagus demonstrate no significant findings. Lungs/Pleura: Very mild atelectasis is seen along the medial aspect of the right upper lobe. A stable 6 mm calcified lung nodule is seen within the right upper lobe. A stable 5  mm noncalcified lung nodule is seen along the lateral aspect of the major fissure on the right (axial CT image 81, CT series number 6). There is no evidence of a pleural effusion or pneumothorax. Musculoskeletal: Multilevel degenerative changes seen throughout the thoracic spine. Review of the MIP images confirms the above findings. CT ABDOMEN and PELVIS FINDINGS Hepatobiliary: No focal liver abnormality is seen. No gallstones, gallbladder wall thickening, or biliary dilatation. Pancreas: Unremarkable. No pancreatic ductal  dilatation or surrounding inflammatory changes. Spleen: The spleen is mildly enlarged. Adrenals/Urinary Tract: Adrenal glands are unremarkable. There is marked severity cortical thinning seen along the anterior aspect of the mid and upper right kidney. The left kidney is normal in size. There is no evidence of renal calculi or hydronephrosis. A 1.5 cm cystic appearing area is seen within the posterior aspect of the mid right kidney, with an additional subcentimeter cyst seen within the lower pole of the left kidney. Bladder is unremarkable. Stomach/Bowel: Stomach is within normal limits. The appendix is not clearly identified. No evidence of bowel wall thickening, distention, or inflammatory changes. Vascular/Lymphatic: There is moderate severity calcification of the abdominal aorta. No enlarged abdominal or pelvic lymph nodes. Reproductive: The prostate gland is mildly enlarged. Other: There is a 3.2 cm x 1.5 cm fat containing umbilical hernia. A 4.0 cm x 2.6 cm fat containing left inguinal hernia is also seen. Musculoskeletal: Degenerative changes are noted throughout the lumbar spine. Review of the MIP images confirms the above findings. IMPRESSION: 1. No evidence of pulmonary embolus. 2. Marked severity coronary artery calcification. 3. Stable 5 mm noncalcified lung nodule along the lateral aspect of the major fissure on the right. 4. Marked severity cortical thinning along the anterior aspect of the mid and upper right kidney. 5. Fat containing umbilical and left inguinal hernias. 6. Mildly enlarged prostate gland. Aortic Atherosclerosis (ICD10-I70.0). Electronically Signed   By: Virgina Norfolk M.D.   On: 10/16/2019 16:46   CT ABDOMEN PELVIS W CONTRAST  Result Date: 10/16/2019 CLINICAL DATA:  Shortness of breath with abdominal pain and neutropenia. EXAM: CT ANGIOGRAPHY CHEST CT ABDOMEN AND PELVIS WITH CONTRAST TECHNIQUE: Multidetector CT imaging of the chest was performed using the standard protocol  during bolus administration of intravenous contrast. Multiplanar CT image reconstructions and MIPs were obtained to evaluate the vascular anatomy. Multidetector CT imaging of the abdomen and pelvis was performed using the standard protocol during bolus administration of intravenous contrast. CONTRAST:  21mL OMNIPAQUE IOHEXOL 350 MG/ML SOLN COMPARISON:  None. FINDINGS: CTA CHEST FINDINGS Cardiovascular: There is moderate severity calcification of the aortic arch. Satisfactory opacification of the pulmonary arteries to the segmental level. No evidence of pulmonary embolism. Normal heart size. No pericardial effusion. Marked severity coronary artery calcification is seen. Mediastinum/Nodes: No enlarged mediastinal, hilar, or axillary lymph nodes. The trachea and esophagus demonstrate no significant findings. Lungs/Pleura: Very mild atelectasis is seen along the medial aspect of the right upper lobe. A stable 6 mm calcified lung nodule is seen within the right upper lobe. A stable 5 mm noncalcified lung nodule is seen along the lateral aspect of the major fissure on the right (axial CT image 81, CT series number 6). There is no evidence of a pleural effusion or pneumothorax. Musculoskeletal: Multilevel degenerative changes seen throughout the thoracic spine. Review of the MIP images confirms the above findings. CT ABDOMEN and PELVIS FINDINGS Hepatobiliary: No focal liver abnormality is seen. No gallstones, gallbladder wall thickening, or biliary dilatation. Pancreas: Unremarkable. No pancreatic ductal dilatation or surrounding  inflammatory changes. Spleen: The spleen is mildly enlarged. Adrenals/Urinary Tract: Adrenal glands are unremarkable. There is marked severity cortical thinning seen along the anterior aspect of the mid and upper right kidney. The left kidney is normal in size. There is no evidence of renal calculi or hydronephrosis. A 1.5 cm cystic appearing area is seen within the posterior aspect of the mid  right kidney, with an additional subcentimeter cyst seen within the lower pole of the left kidney. Bladder is unremarkable. Stomach/Bowel: Stomach is within normal limits. The appendix is not clearly identified. No evidence of bowel wall thickening, distention, or inflammatory changes. Vascular/Lymphatic: There is moderate severity calcification of the abdominal aorta. No enlarged abdominal or pelvic lymph nodes. Reproductive: The prostate gland is mildly enlarged. Other: There is a 3.2 cm x 1.5 cm fat containing umbilical hernia. A 4.0 cm x 2.6 cm fat containing left inguinal hernia is also seen. Musculoskeletal: Degenerative changes are noted throughout the lumbar spine. Review of the MIP images confirms the above findings. IMPRESSION: 1. No evidence of pulmonary embolus. 2. Marked severity coronary artery calcification. 3. Stable 5 mm noncalcified lung nodule along the lateral aspect of the major fissure on the right. 4. Marked severity cortical thinning along the anterior aspect of the mid and upper right kidney. 5. Fat containing umbilical and left inguinal hernias. 6. Mildly enlarged prostate gland. Aortic Atherosclerosis (ICD10-I70.0). Electronically Signed   By: Virgina Norfolk M.D.   On: 10/16/2019 16:47   DG Chest Port 1 View  Result Date: 10/16/2019 CLINICAL DATA:  Of breath x3 days. EXAM: PORTABLE CHEST 1 VIEW COMPARISON:  October 15, 2019 FINDINGS: There is a dual lead AICD. Multiple sternal wires are noted. Mild, diffuse chronic appearing increased lung markings are seen. There is no evidence of acute infiltrate, pleural effusion or pneumothorax. The cardiac silhouette is borderline in size. An artificial cardiac valve is seen. There is mild calcification of the aortic arch. Multilevel degenerative changes seen throughout the thoracic spine. IMPRESSION: Chronic appearing increased lung markings without evidence of acute or active cardiopulmonary disease. Electronically Signed   By: Virgina Norfolk  M.D.   On: 10/16/2019 15:04        Assessment and Plan:   1. Persistent atrial fibrillation: Patient is in atrial fibrillation today.  Review of his ECG shows ventricular pacing with atrial fibrillation.  He has been loaded on dofetilide.  His QTC, corrected for his paced QRS, is 447 ms.  Would continue dofetilide at current dose. 2. Acute COPD exacerbation: Currently on antibiotics and IV steroids.  Plan per primary team.      For questions or updates, please contact [place] Please consult [email or web address] for contact info under    Signed, Kemo Spruce Meredith Leeds, MD  10/17/2019 10:13 AM

## 2019-10-17 NOTE — Progress Notes (Signed)
Pharmacy: Dofetilide (Tikosyn) - Follow Up Assessment and Electrolyte Replacement  Pharmacy consulted to assist in monitoring and replacing electrolytes in this 69 y.o. male admitted on 10/16/2019 undergoing dofetilide re-initiation. Patient did not take dofetilide for ~ 3 days and was on 250 mcg twice daily. CrCl 48 ml/min on admission and dofetilide 253mcg twice daily re-initiated. First dofetilide dose: at 2200 on 6/19. QTc was 505 on admission and 510 prior to 2200 dose of dofetilide.   Labs:    Component Value Date/Time   K 4.2 10/16/2019 2250   MG 2.8 (H) 10/16/2019 2250     Plan: Potassium: 5.2 no visible hemolysis K >/= 4: No additional supplementation needed  Magnesium: 2.6  Mg > 2: No additional supplementation needed  EKG Monitoring: - QTc 510 prior to 2200 of dofetilide, no EKG after dose. No EKG this AM, requested EKG before 1000 dose - Follow up QTc and EP recommendations.   Thank you for allowing pharmacy to participate in this patient's care   Cristela Felt, PharmD PGY1 Pharmacy Resident [employer]: [phone]  10/17/2019  7:57 AM

## 2019-10-17 NOTE — Assessment & Plan Note (Signed)
Advised 2lpm hs and check oxygen saturations at highest level of activity to be sure it stays over 90% and adjust upward to maintain this level if needed but remember to turn it back to previous settings when you stop (to conserve your supply).

## 2019-10-17 NOTE — Care Management (Addendum)
Benefit check sent for Tikosyn. TOC pharmacy added to profile to fill at discharge. Spoke w patient at bedside. He states that home O2 supplier is [facility]. He is requesting an over the shoulder portable oxygen concentrator. We discussed that this needs to be arranged through his PCP. He has concentrator and multiple tanks for transport at home.

## 2019-10-17 NOTE — Assessment & Plan Note (Addendum)
Quit smoking 1993 - PFT's  07/2015   FEV1 1.01 (56 % ) ratio 0.70  p 18 % improvement from saba p ? prior to study with DLCO  17.77 (54%) corrects to 3.67 (79%)  for alv volume and FV curve mild concavity    DDX of  difficult airways management almost all start with A and  include Adherence, Ace Inhibitors, Acid Reflux, Active Sinus Disease, Alpha 1 Antitripsin deficiency, Anxiety masquerading as Airways dz,  ABPA,  Allergy(esp in young), Aspiration (esp in elderly), Adverse effects of meds,  Active smoking or vaping, A bunch of PE's (a small clot burden can't cause this syndrome unless there is already severe underlying pulm or vascular dz with poor reserve) plus two Bs  = Bronchiectasis and Beta blocker use..and one C= CHF   Adherence is always the initial "prime suspect" and is a multilayered concern that requires a "trust but verify" approach in every patient - starting with knowing how to use medications, especially inhalers, correctly, keeping up with refills and understanding the fundamental difference between maintenance and prns vs those medications only taken for a very short course and then stopped and not refilled.  - hfa demonstrated for fm and pt - return to office with all meds in hand using a trust but verify approach to confirm accurate Medication  Reconciliation The principal here is that until we are certain that the  patients are doing what we've asked, it makes no sense to ask them to do more.    ? Acid (or non-acid) GERD > always difficult to exclude as up to 75% of pts in some series report no assoc GI/ Heartburn symptoms> rec max (24h)  acid suppression and diet restrictions/ reviewed and instructions reviewed  ? Active sinus dz, esp given noct chronic cough > check sinus CT .   ? Allergy component > unlikely but needs to be checked off pred for IgE and Eos  ? Alpha one AT def >  Levels 137  11/23/15   ? Adverse effects of meds > none of the usual suspects liste  ? BB  effects: In the setting of respiratory symptoms of unknown etiology,  It would be preferable to use bystolic, the most beta -1  selective Beta blocker available in sample form, with bisoprolol the most selective generic choice  on the market, at least on a trial basis, to make sure the spillover Beta 2 effects of the less specific Beta blockers are not contributing to this patient's symptoms.   ? Bronchiectasis > not suggested on ct   ? CHF >  Echo previously  with high LV  pressures but BNP not impressive at this point    Clearly  Group D in terms of symptom/risk and laba/lama/ICS  therefore appropriate rx at this point >>>  Symbicort/spiriva or preferably Breztri 2bid best option if has mastered hfa

## 2019-10-17 NOTE — Consult Note (Addendum)
@LOGO @  [name], male    DOB: [birth year], 69 y.o.   MRN: [id number]   Brief patient profile:  69 yowm remote smoker (quit 1993)  with GOLD II copd with reversibility when last seen by Dr Lamonte Sakai in 2017 and x one year having more freq flares characterized as aecopd despite maint on symb 160 2bid has doe x any steps and severe chronic cough at hs assoc with tendency to gag/ vomit  With similar flare week of 6/14 > admit 6/19 and PCCM service asked to consult pm 6/20      History of Present Illness  10/16/2019  Pulmonary Consultation/ Shahir Karen   Dyspnea:  At baseline = MMRC1 = can walk nl pace, flat grade, can't hurry or go uphills or steps s sob  s 02 but with flares sob at rest Cough:severe fits w/in a few hours of hs at baseline then flares 24/7 with slt discoloration of sputum assc with sensation of pnds/sub wheeze  and gag/vomit Sleep: flat/ one pillow with awakening most nocts as above SABA use: has both saba hfa and neb but never pre-challenges with exertion,  hfa doesn't really seem to help with flares but uses twice a day typically p exertion 02  2lpm hs and prn daytime but not checking sats  No obvious other patterns in  day to day or daytime variability or assoc  mucus plugs or hemoptysis or cp or chest tightness, subjective wheeze .   Also denies any obvious fluctuation of symptoms with weather or environmental changes or other aggravating or alleviating factors except as outlined above   No unusual exposure hx or h/o childhood pna/ asthma or knowledge of premature birth.  Current Allergies, Complete Past Medical History, Past Surgical History, Family History, and Social History were reviewed in [employer] record.  ROS  The following are not active complaints unless bolded Hoarseness, sore throat, dysphagia, dental problems, itching, sneezing,  nasal congestion or discharge of excess mucus or purulent secretions, ear ache,   fever, chills, sweats,  unintended wt loss or wt gain, classically pleuritic or exertional cp,  orthopnea pnd or arm/hand swelling  or leg swelling, presyncope, palpitations, abdominal pain, anorexia, nausea, vomiting, diarrhea  or change in bowel habits or change in bladder habits, change in stools or change in urine, dysuria, hematuria,  rash, arthralgias, visual complaints, headache, numbness, weakness or ataxia or problems with walking or coordination,  change in mood or  memory.           Past Medical History:  Diagnosis Date  . Aortic stenosis, mild   . Arthritis of hand    "just a little bit in both hands" (03/31/2012)  . Asthma    "little bit" (03/31/2012)  . Atrial fibrillation [facility])    dx '04; DCCV '04, placed on flecainide, failed DCCV 04/2010, flecainide stopped; s/p successful A.fib ablation 01/31/12  . CHF (congestive heart failure) ([place])   . Diabetes mellitus without complication (HCC)    borderline  . DJD (degenerative joint disease)   . Exertional dyspnea   . Fibromyalgia   . First degree AV block    post ablation, ~4108ms  . GERD (gastroesophageal reflux disease)   . Heart murmur   . Hyperlipidemia   . Hypertension   . Hypothyroidism    S/P radiation  . Left atrial enlargement    LA size 39mm by echo 11/21/11  . Mitral regurgitation    trivial  . Obesity   . Obstructive sleep  apnea    mild by sleep study 2013; pt stated he does not have a machine because "it wasn't bad enough for him to have one"  . Pacemaker 03/31/2012  . Restless leg syndrome   . Second degree AV block   . Splenomegaly   . Visit for monitoring Tikosyn therapy 04/16/2016    (Not in an outpatient encounter)    Objective:     BP 125/63 (BP Location: Right Arm)   Pulse 77   Temp 98.7 F (37.1 C) (Oral)   Resp 16   Wt 85.6 kg   SpO2 97%   BMI 26.32 kg/m   SpO2: 97 % O2 Flow Rate (L/min): 2 L/min    HEENT : pt wearing mask not removed for exam due to covid - 19 concerns.    NECK :  without  JVD/Nodes/TM/ nl carotid upstrokes bilaterally   LUNGS: no acc muscle use,  Mild barrel  contour chest wall with bilateral  Distant  Exp  wheeze and  without cough on insp or exp maneuvers  and mild  Hyperresonant  to  percussion bilaterally     CV:  RRR  no s3 or murmur or increase in P2, and no edema   ABD:  soft and nontender with pos end  insp Hoover's  in the supine position. No bruits or organomegaly appreciated, bowel sounds nl  MS:   Nl gait/  ext warm without deformities, calf tenderness, cyanosis or clubbing No obvious joint restrictions   SKIN: warm and dry without lesions    NEURO:  alert, approp, nl sensorium with  no motor or cerebellar deficits apparent.        I personally reviewed images and agree with radiology impression as follows:   Chest CTa 6/19 1. No evidence of pulmonary embolus. 2. Marked severity coronary artery calcification. 3. Stable 5 mm noncalcified lung nodule along the lateral aspect of the major fissure on the right.  BNP   6/19 243     Assessment   COPD (chronic obstructive pulmonary disease) (HCC) Quit smoking 1993 - PFT's  08/21/15  FEV1 1.01 (56 % ) ratio 0.70  p 18 % improvement from saba p ? prior to study with DLCO  17.77 (54%) corrects to 3.67 (79%)  for alv volume and FV curve mild concavity    DDX of  difficult airways management almost all start with A and  include Adherence, Ace Inhibitors, Acid Reflux, Active Sinus Disease, Alpha 1 Antitripsin deficiency, Anxiety masquerading as Airways dz,  ABPA,  Allergy(esp in young), Aspiration (esp in elderly), Adverse effects of meds,  Active smoking or vaping, A bunch of PE's (a small clot burden can't cause this syndrome unless there is already severe underlying pulm or vascular dz with poor reserve) plus two Bs  = Bronchiectasis and Beta blocker use..and one C= CHF   Adherence is always the initial "prime suspect" and is a multilayered concern that requires a "trust but verify" approach in  every patient - starting with knowing how to use medications, especially inhalers, correctly, keeping up with refills and understanding the fundamental difference between maintenance and prns vs those medications only taken for a very short course and then stopped and not refilled.  - hfa demonstrated for fm and pt - return to office with all meds in hand using a trust but verify approach to confirm accurate Medication  Reconciliation The principal here is that until we are certain that the  patients are doing what we've  asked, it makes no sense to ask them to do more.    ? Acid (or non-acid) GERD > always difficult to exclude as up to 75% of pts in some series report no assoc GI/ Heartburn symptoms> rec max (24h)  acid suppression and diet restrictions/ reviewed and instructions reviewed  ? Active sinus dz, esp given noct chronic cough > check sinus CT .   ? Allergy component > unlikely but needs to be checked off pred for IgE and Eos  ? Alpha one AT def >  Levels 137  11/23/15   ? Adverse effects of meds > none of the usual suspects liste  ? BB effects: In the setting of respiratory symptoms of unknown etiology,  It would be preferable to use bystolic, the most beta -1  selective Beta blocker available in sample form, with bisoprolol the most selective generic choice  on the market, at least on a trial basis, to make sure the spillover Beta 2 effects of the less specific Beta blockers are not contributing to this patient's symptoms.   ? Bronchiectasis > not suggested on ct   ? CHF >  Echo previously  with high LV  pressures but BNP not impressive at this point    Clearly  Group D in terms of symptom/risk and laba/lama/ICS  therefore appropriate rx at this point >>>  Symbicort/spiriva or preferably Breztri 2bid best option if has mastered hfa    Chronic respiratory failure with hypoxia (HCC) Advised 2lpm hs and check oxygen saturations at highest level of activity to be sure it stays over  90% and adjust upward to maintain this level if needed but remember to turn it back to previous settings when you stop (to conserve your supply).   Essential Hypertension ->> change lopressor to bisoprolol 5 mg daily    Pulmonary hypertension ([place]) See echo 11/05/17  MIld RVE PAS 35 with good ef but high LV filling pressures  C/w WHO III pulmonary hypertension:  rx is to treat the bp/ keep 02 up > 90% at all times > f/u Dr Lamonte Sakai as outpt    Christinia Gully, MD 10/17/2019

## 2019-10-17 NOTE — Progress Notes (Signed)
PROGRESS NOTE  [name]  DOB: [birth year]  PCP: Velna Hatchet, MD KNL:[id number]  DOA: 10/16/2019  LOS: 1 day   No chief complaint on file.  Brief narrative: [name] is a 69 y.o. 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 fib on Tikosyn and systemic anticoagulation, IIDM. Patient presented to the ED on 6/19 with increasing short of breath, wheezing, cough and poor oral intake for 3 days.  Denies any fever chills.  Patient came to ED a day earlier for same acute issue and was discharged with p.o. steroid.his symptoms persisted and he was also not able to take his regular medications including Tikosyn.  He returned back to ED on 6/19.    In the ED, patient was tachypneic, hypoxic and required BiPAP. Labs showed creatinine 1.66, troponin slightly elevated to 23, initial lactic acid was elevated to 5.8, WBC count elevated to 17.3. Chest x-ray showed chronic appearing increased lung markings without evidence of acute or active cardiopulmonary disease CT angio of chest did not show any pulmonary embolism, infiltrate, effusion or edema.  It showed marked severity coronary artery calcification and a stable 5 mm noncalcified lung nodule on the right. Patient was admitted to hospitalist service for further evaluation and management.  Subjective: Patient was seen and examined this morning.  Patient had a reflux and male.  Lying down in bed.  Not in distress but complains of chronic cough with mucoid sputum.  Not on Mucinex on chronic steroids at home.  Has not seen a pulmonologist in a long time.   Chart reviewed. Lactic acid trending down, most recent was 4 last night. Creatinine trending downward as well.  1.54 last night.  Assessment/Plan: Acute on chronic respiratory failure with hypoxia Acute exacerbation of advanced COPD Recurrent COPD flareup -Presented with shortness of breath wheezing, cough. -Home meds include  Pulmicort, SABA SAMA.  -Not on Mucinex on chronic steroids at home.  Has not seen a pulmonologist in a long time.   -Chest imaging did not show any acute infiltrate.  Patient probably has viral bronchitis versus atypical pneumonia. -On admission, patient was started on short course of p.o. doxycycline and IV steroids. -Continue the same along with bronchodilators, flutter valves. -I added scheduled Mucinex this morning. -Pulmonary consultation called by admitting doctor. -Consult transition care team for portable O2 supply  Lactic acidosis -Initial lactic acid level elevated to 5.5, slowly trending down but still remains elevated, most recent was 4 last night.   -Clinically not in sepsis.  Lactic acid elevated probably secondary to anaerobic respiration.  WBC count was elevated likely because of steroids.   -Repeat lactic acid level tomorrow.  Paroxysmal A. Fib -Home meds include Tikosyn 250 mg twice daily and metoprolol 50 mg twice daily along with Eliquis 5 mg daily.   -All medications have been resumed.  Since Tikosyn has been reinitiated after a pause, patient may need to be monitored for few days.   -Cardiology consultation obtained.  Essential hypertension -Home meds include amlodipine 10 mg daily, metoprolol 50 mg twice daily, torsemide 40 mg every other day Aldactone 25 mg daily. -Amlodipine and metoprolol resumed.  Because of AKI, keep torsemide, Aldactone as well as potassium and magnesium supplements on hold.    AKI on CKD IIIb -Creatinine at baseline seems to be between 1.3-1.5. -Presented with creatinine of 1.66.  Slowly improving with hydration. -Continue to monitor.  Hypothyroidism -Continue Synthroid  Other home meds also include  include Neurontin 600 mg, 600 mg and 1200 mg, Norco as needed twice daily, Protonix 40 mg daily, Requip 2 mg at bedtime and Trazodone 12.5 mg at bedtime. Continue all.  Mobility: Ambulates independently at home.  Encourage ambulation Code  Status:   Code Status: Full Code  DVT prophylaxis:   apixaban (ELIQUIS) tablet 5 mg  Antimicrobials:  Doxycycline oral Fluid: Currently normal saline 125 mL/h.  I will reduce it to 50 mill per hour.  Diet:  Diet Order            Diet Heart Room service appropriate? Yes; Fluid consistency: Thin  Diet effective now                 Consultants: Cardiology, pulmonology Family Communication:  None at bedside    Status is: Inpatient  Remains inpatient appropriate because:IV treatments appropriate due to intensity of illness or inability to take PO   Dispo: The patient is from: Home              Anticipated d/c is to: Home              Anticipated d/c date is: 3 days              Patient currently is not medically stable to d/c.        Infusions:  . sodium chloride 125 mL/hr at 10/16/19 2210    Scheduled Meds: . amLODipine  5 mg Oral Daily  . apixaban  5 mg Oral BID  . cholecalciferol  8,000 Units Oral QHS  . dofetilide  250 mcg Oral BID  . doxycycline  100 mg Oral Q12H  . fluticasone furoate-vilanterol  1 puff Inhalation Daily  . gabapentin  1,200 mg Oral QHS  . gabapentin  600 mg Oral BID WC  . guaiFENesin  1,200 mg Oral BID  . insulin aspart  0-5 Units Subcutaneous QHS  . insulin aspart  0-9 Units Subcutaneous TID WC  . ipratropium-albuterol  3 mL Nebulization Q6H  . levothyroxine  175 mcg Oral QAC breakfast  . loratadine  10 mg Oral Daily  . methylPREDNISolone (SOLU-MEDROL) injection  40 mg Intravenous Q6H  . metoprolol tartrate  50 mg Oral BID  . pantoprazole  40 mg Oral Daily  . rOPINIRole  2 mg Oral QHS  . rosuvastatin  10 mg Oral QHS  . spironolactone  25 mg Oral Daily  . traMADol  50 mg Oral BID  . traZODone  25 mg Oral QHS    Antimicrobials: Anti-infectives (From admission, onward)   Start     Dose/Rate Route Frequency Ordered Stop   10/17/19 1000  azithromycin (ZITHROMAX) tablet 250 mg  Status:  Discontinued       "Followed by" Linked Group  Details   250 mg Oral Daily 10/16/19 2003 10/16/19 2014   10/17/19 0330  vancomycin (VANCOREADY) IVPB 750 mg/150 mL  Status:  Discontinued        750 mg 100 mL/hr over 90 Minutes Intravenous Every 12 hours 10/16/19 1416 10/16/19 2002   10/16/19 2200  doxycycline (VIBRA-TABS) tablet 100 mg     Discontinue     100 mg Oral Every 12 hours 10/16/19 2014     10/16/19 2015  azithromycin (ZITHROMAX) tablet 500 mg  Status:  Discontinued       "Followed by" Linked Group Details   500 mg Oral Daily 10/16/19 2003 10/16/19 2014   10/16/19 1415  ceFEPIme (MAXIPIME) 2 g in sodium chloride 0.9 %  100 mL IVPB  Status:  Discontinued        2 g 200 mL/hr over 30 Minutes Intravenous  Once 10/16/19 1401 10/16/19 1414   10/16/19 1415  metroNIDAZOLE (FLAGYL) IVPB 500 mg        500 mg 100 mL/hr over 60 Minutes Intravenous  Once 10/16/19 1401 10/16/19 1531   10/16/19 1415  vancomycin (VANCOCIN) IVPB 1000 mg/200 mL premix  Status:  Discontinued        1,000 mg 200 mL/hr over 60 Minutes Intravenous  Once 10/16/19 1401 10/16/19 1412   10/16/19 1415  vancomycin (VANCOREADY) IVPB 1750 mg/350 mL        1,750 mg 116.7 mL/hr over 180 Minutes Intravenous  Once 10/16/19 1412 10/16/19 1856   10/16/19 1415  ceFEPIme (MAXIPIME) 2 g in sodium chloride 0.9 % 100 mL IVPB  Status:  Discontinued        2 g 200 mL/hr over 30 Minutes Intravenous Every 12 hours 10/16/19 1414 10/16/19 2002      PRN meds: guaiFENesin, HYDROcodone-acetaminophen, ipratropium-albuterol, ondansetron, polyethylene glycol powder   Objective: Vitals:   10/17/19 0733 10/17/19 0810  BP: 125/63   Pulse: 77   Resp: 16   Temp: 98.7 F (37.1 C)   SpO2: 100% 97%    Intake/Output Summary (Last 24 hours) at 10/17/2019 1054 Last data filed at 10/17/2019 0500 Gross per 24 hour  Intake 851.52 ml  Output --  Net 851.52 ml   Filed Weights   10/16/19 2300  Weight: 85.6 kg   Weight change:  Body mass index is 26.32 kg/m.   Physical Exam: General  exam: Appears calm and comfortable.  Lying on bed.  Not in physical distress except for intermittent cough spells Skin: No rashes, lesions or ulcers. HEENT: Atraumatic, normocephalic, supple neck, no obvious bleeding Lungs: Clear to auscultation bilaterally.  Coughs on deep breathing CVS: Regular rate and rhythm, no murmur GI/Abd soft, nontender, nondistended, bowel sound present CNS: Alert, awake, oriented x3 Psychiatry: Mood appropriate Extremities: No pedal edema, no calf tenderness  Data Review: I have personally reviewed the laboratory data and studies available.  Recent Labs  Lab 10/15/19 0305 10/16/19 1312 10/16/19 1341 10/16/19 1342 10/17/19 0653  WBC 10.0 17.3*  --   --  10.3  NEUTROABS 8.7* 15.0*  --   --   --   HGB 13.2 14.5 14.6 14.3 11.9*  HCT 40.8 44.7 43.0 42.0 38.7*  MCV 92.1 92.5  --   --  96.0  PLT 131* 226  --   --  147*   Recent Labs  Lab 10/15/19 0305 10/15/19 0305 10/16/19 1312 10/16/19 1341 10/16/19 1342 10/16/19 2250 10/17/19 0653  NA 135   < > 138 139 139 139 138  K 4.5   < > 4.0 3.8 3.7 4.2 5.2*  CL 100  --  103 104  --  106 107  CO2 25  --  17*  --   --  20* 22  GLUCOSE 133*  --  142* 155*  --  182* 156*  BUN 18  --  28* 29*  --  28* 27*  CREATININE 1.38*  --  1.66* 1.40*  --  1.54* 1.30*  CALCIUM 9.4  --  9.9  --   --  8.4* 8.6*  MG  --   --   --   --   --  2.8* 2.6*   < > = values in this interval not displayed.    Signed, Marlowe Aschoff  Zierra Laroque, MD [facility] Pager: [phone] (Secure Chat preferred). 10/17/2019

## 2019-10-17 NOTE — Progress Notes (Signed)
OT Cancellation Note  Patient Details Name: [name] MRN: [id number] DOB: 1950/06/02   Cancelled Treatment:    Reason Eval/Treat Not Completed: OT screened, no needs identified, will sign off (Per PT evaluation and discussion, pt independent for BADL/IADL. No OT needs identified, OT will sign off.)  Zenovia Jarred, MSOT, OTR/L [facility] Office Number: [phone] Pager: [phone]  Zenovia Jarred 10/17/2019, 4:40 PM

## 2019-10-18 ENCOUNTER — Encounter (HOSPITAL_COMMUNITY): Payer: Self-pay | Admitting: Internal Medicine

## 2019-10-18 ENCOUNTER — Other Ambulatory Visit: Payer: Self-pay

## 2019-10-18 LAB — MAGNESIUM: Magnesium: 2.2 mg/dL (ref 1.7–2.4)

## 2019-10-18 LAB — BASIC METABOLIC PANEL
Anion gap: 8 (ref 5–15)
BUN: 27 mg/dL — ABNORMAL HIGH (ref 8–23)
CO2: 23 mmol/L (ref 22–32)
Calcium: 8.6 mg/dL — ABNORMAL LOW (ref 8.9–10.3)
Chloride: 105 mmol/L (ref 98–111)
Creatinine, Ser: 1.34 mg/dL — ABNORMAL HIGH (ref 0.61–1.24)
GFR calc Af Amer: >60 mL/min (ref >60)
GFR calc non Af Amer: 53 mL/min — ABNORMAL LOW (ref >60)
Glucose, Bld: 162 mg/dL — ABNORMAL HIGH (ref 70–99)
Potassium: 4.8 mmol/L (ref 3.5–5.1)
Sodium: 136 mmol/L (ref 135–145)

## 2019-10-18 LAB — GLUCOSE, CAPILLARY
Glucose-Capillary: 145 mg/dL — ABNORMAL HIGH (ref 70–99)
Glucose-Capillary: 175 mg/dL — ABNORMAL HIGH (ref 70–99)
Glucose-Capillary: 122 mg/dL — ABNORMAL HIGH (ref 70–99)
Glucose-Capillary: 188 mg/dL — ABNORMAL HIGH (ref 70–99)

## 2019-10-18 LAB — PROTIME-INR
INR: 1.6 — ABNORMAL HIGH (ref 0.8–1.2)
Prothrombin Time: 18.5 seconds — ABNORMAL HIGH (ref 11.4–15.2)

## 2019-10-18 LAB — LACTIC ACID, PLASMA: Lactic Acid, Venous: 3.5 mmol/L (ref 0.5–1.9)

## 2019-10-18 MED ORDER — ENSURE ENLIVE PO LIQD
237.0000 mL | Freq: Two times a day (BID) | ORAL | Status: DC
  Administered 2019-10-19: 237 mL via ORAL

## 2019-10-18 NOTE — TOC Benefit Eligibility Note (Signed)
Transition of Care [facility]) Benefit Eligibility Note    Patient Details  Name: [name] MRN: [id number] Date of Birth: 1950/09/04   Medication/Dose: dofetilide 2108mcg  Covered?: Yes     Prescription Coverage Preferred Pharmacy: [facility]  Spoke with Person/Company/Phone Number:: [employer]  Co-Pay: $4.00 for 90 day retail  Prior Approval: No     Additional Notes: patient just had 90 day supply of this medication filled on 09/30/2019    [place] Phone Number: 10/18/2019, 12:11 PM

## 2019-10-18 NOTE — Progress Notes (Addendum)
Electrophysiology Rounding Note  Patient Name: [name] Date of Encounter: 10/18/2019  Primary Cardiologist: No primary care provider on file. Electrophysiologist: Thompson Grayer, MD   Subjective   He is feeling much better today.  He clarifies that he didn't run out of his medication, and still has stock at home, he was just having severe nausea in the setting of his SOB and unable to keep any of his medications down for about 3 days.   Inpatient Medications    Scheduled Meds:  amLODipine  5 mg Oral Daily   apixaban  5 mg Oral BID   bisoprolol  5 mg Oral Daily   cholecalciferol  8,000 Units Oral QHS   dofetilide  250 mcg Oral BID   doxycycline  100 mg Oral Q12H   gabapentin  1,200 mg Oral QHS   gabapentin  600 mg Oral BID WC   insulin aspart  0-5 Units Subcutaneous QHS   insulin aspart  0-9 Units Subcutaneous TID WC   levothyroxine  175 mcg Oral QAC breakfast   loratadine  10 mg Oral Daily   methylPREDNISolone (SOLU-MEDROL) injection  80 mg Intravenous Q12H   mometasone-formoterol  2 puff Inhalation BID   pantoprazole  40 mg Oral BID AC   rOPINIRole  2 mg Oral QHS   rosuvastatin  10 mg Oral QHS   spironolactone  25 mg Oral Daily   traMADol  50 mg Oral BID   traZODone  25 mg Oral QHS   Continuous Infusions:   PRN Meds: HYDROcodone-acetaminophen, ipratropium-albuterol, ondansetron, polyethylene glycol powder   Vital Signs    Vitals:   10/17/19 2054 10/17/19 2100 10/17/19 2345 10/18/19 0459  BP: 138/72  132/71 133/85  Pulse: 69  70 70  Resp:      Temp: 98.5 F (36.9 C)  98.2 F (36.8 C) 97.6 F (36.4 C)  TempSrc: Oral  Oral Oral  SpO2: 98% 99% 98% 99%  Weight:    87.5 kg  Height:        Intake/Output Summary (Last 24 hours) at 10/18/2019 0742 Last data filed at 10/18/2019 0504 Gross per 24 hour  Intake 1206.73 ml  Output 2025 ml  Net -818.27 ml   Filed Weights   10/16/19 2300 10/17/19 1236 10/18/19 0459  Weight: 85.6 kg 86.6 kg 87.5 kg     Physical Exam    GEN- The patient is well appearing, alert and oriented x 3 today.   Head- normocephalic, atraumatic Eyes-  Sclera clear, conjunctiva pink Ears- hearing intact Oropharynx- clear Neck- supple Lungs- Clear to ausculation bilaterally, normal work of breathing Heart- Regular rate and rhythm (V pacing by tele), no murmurs, rubs or gallops GI- soft, NT, ND, + BS Extremities- no clubbing, cyanosis, or edema Skin- no rash or lesion Psych- euthymic mood, full affect Neuro- strength and sensation are intact  Labs    CBC Recent Labs    10/16/19 1312 10/16/19 1341 10/16/19 1342 10/17/19 0653  WBC 17.3*  --   --  10.3  NEUTROABS 15.0*  --   --   --   HGB 14.5   < > 14.3 11.9*  HCT 44.7   < > 42.0 38.7*  MCV 92.5  --   --  96.0  PLT 226  --   --  147*   < > = values in this interval not displayed.   Basic Metabolic Panel Recent Labs    10/17/19 0653 10/18/19 0444  NA 138 136  K 5.2*  4.8  CL 107 105  CO2 22 23  GLUCOSE 156* 162*  BUN 27* 27*  CREATININE 1.30* 1.34*  CALCIUM 8.6* 8.6*  MG 2.6* 2.2   Liver Function Tests Recent Labs    10/16/19 1312  AST 26  ALT 21  ALKPHOS 68  BILITOT 1.2  PROT 7.4  ALBUMIN 4.2   Recent Labs    10/16/19 1312  LIPASE 80*   Cardiac Enzymes No results for input(s): CKTOTAL, CKMB, CKMBINDEX, TROPONINI in the last 72 hours.   Telemetry    V pacing (personally reviewed)  Radiology    CT Angio Chest PE W and/or Wo Contrast  Result Date: 10/16/2019 CLINICAL DATA:  Cough and shortness of breath with vomiting. EXAM: CT ANGIOGRAPHY CHEST CT ABDOMEN AND PELVIS WITH CONTRAST TECHNIQUE: Multidetector CT imaging of the chest was performed using the standard protocol during bolus administration of intravenous contrast. Multiplanar CT image reconstructions and MIPs were obtained to evaluate the vascular anatomy. Multidetector CT imaging of the abdomen and pelvis was performed using the standard protocol during bolus  administration of intravenous contrast. CONTRAST:  94mL OMNIPAQUE IOHEXOL 350 MG/ML SOLN COMPARISON:  Sep 09, 2019 FINDINGS: CTA CHEST FINDINGS Cardiovascular: There is moderate severity calcification of the aortic arch. Satisfactory opacification of the pulmonary arteries to the segmental level. No evidence of pulmonary embolism. Normal heart size. No pericardial effusion. Marked severity coronary artery calcification is seen. Mediastinum/Nodes: No enlarged mediastinal, hilar, or axillary lymph nodes. The trachea and esophagus demonstrate no significant findings. Lungs/Pleura: Very mild atelectasis is seen along the medial aspect of the right upper lobe. A stable 6 mm calcified lung nodule is seen within the right upper lobe. A stable 5 mm noncalcified lung nodule is seen along the lateral aspect of the major fissure on the right (axial CT image 81, CT series number 6). There is no evidence of a pleural effusion or pneumothorax. Musculoskeletal: Multilevel degenerative changes seen throughout the thoracic spine. Review of the MIP images confirms the above findings. CT ABDOMEN and PELVIS FINDINGS Hepatobiliary: No focal liver abnormality is seen. No gallstones, gallbladder wall thickening, or biliary dilatation. Pancreas: Unremarkable. No pancreatic ductal dilatation or surrounding inflammatory changes. Spleen: The spleen is mildly enlarged. Adrenals/Urinary Tract: Adrenal glands are unremarkable. There is marked severity cortical thinning seen along the anterior aspect of the mid and upper right kidney. The left kidney is normal in size. There is no evidence of renal calculi or hydronephrosis. A 1.5 cm cystic appearing area is seen within the posterior aspect of the mid right kidney, with an additional subcentimeter cyst seen within the lower pole of the left kidney. Bladder is unremarkable. Stomach/Bowel: Stomach is within normal limits. The appendix is not clearly identified. No evidence of bowel wall thickening,  distention, or inflammatory changes. Vascular/Lymphatic: There is moderate severity calcification of the abdominal aorta. No enlarged abdominal or pelvic lymph nodes. Reproductive: The prostate gland is mildly enlarged. Other: There is a 3.2 cm x 1.5 cm fat containing umbilical hernia. A 4.0 cm x 2.6 cm fat containing left inguinal hernia is also seen. Musculoskeletal: Degenerative changes are noted throughout the lumbar spine. Review of the MIP images confirms the above findings. IMPRESSION: 1. No evidence of pulmonary embolus. 2. Marked severity coronary artery calcification. 3. Stable 5 mm noncalcified lung nodule along the lateral aspect of the major fissure on the right. 4. Marked severity cortical thinning along the anterior aspect of the mid and upper right kidney. 5. Fat containing umbilical and left  inguinal hernias. 6. Mildly enlarged prostate gland. Aortic Atherosclerosis (ICD10-I70.0). Electronically Signed   By: Virgina Norfolk M.D.   On: 10/16/2019 16:46   CT ABDOMEN PELVIS W CONTRAST  Result Date: 10/16/2019 CLINICAL DATA:  Shortness of breath with abdominal pain and neutropenia. EXAM: CT ANGIOGRAPHY CHEST CT ABDOMEN AND PELVIS WITH CONTRAST TECHNIQUE: Multidetector CT imaging of the chest was performed using the standard protocol during bolus administration of intravenous contrast. Multiplanar CT image reconstructions and MIPs were obtained to evaluate the vascular anatomy. Multidetector CT imaging of the abdomen and pelvis was performed using the standard protocol during bolus administration of intravenous contrast. CONTRAST:  55mL OMNIPAQUE IOHEXOL 350 MG/ML SOLN COMPARISON:  None. FINDINGS: CTA CHEST FINDINGS Cardiovascular: There is moderate severity calcification of the aortic arch. Satisfactory opacification of the pulmonary arteries to the segmental level. No evidence of pulmonary embolism. Normal heart size. No pericardial effusion. Marked severity coronary artery calcification is seen.  Mediastinum/Nodes: No enlarged mediastinal, hilar, or axillary lymph nodes. The trachea and esophagus demonstrate no significant findings. Lungs/Pleura: Very mild atelectasis is seen along the medial aspect of the right upper lobe. A stable 6 mm calcified lung nodule is seen within the right upper lobe. A stable 5 mm noncalcified lung nodule is seen along the lateral aspect of the major fissure on the right (axial CT image 81, CT series number 6). There is no evidence of a pleural effusion or pneumothorax. Musculoskeletal: Multilevel degenerative changes seen throughout the thoracic spine. Review of the MIP images confirms the above findings. CT ABDOMEN and PELVIS FINDINGS Hepatobiliary: No focal liver abnormality is seen. No gallstones, gallbladder wall thickening, or biliary dilatation. Pancreas: Unremarkable. No pancreatic ductal dilatation or surrounding inflammatory changes. Spleen: The spleen is mildly enlarged. Adrenals/Urinary Tract: Adrenal glands are unremarkable. There is marked severity cortical thinning seen along the anterior aspect of the mid and upper right kidney. The left kidney is normal in size. There is no evidence of renal calculi or hydronephrosis. A 1.5 cm cystic appearing area is seen within the posterior aspect of the mid right kidney, with an additional subcentimeter cyst seen within the lower pole of the left kidney. Bladder is unremarkable. Stomach/Bowel: Stomach is within normal limits. The appendix is not clearly identified. No evidence of bowel wall thickening, distention, or inflammatory changes. Vascular/Lymphatic: There is moderate severity calcification of the abdominal aorta. No enlarged abdominal or pelvic lymph nodes. Reproductive: The prostate gland is mildly enlarged. Other: There is a 3.2 cm x 1.5 cm fat containing umbilical hernia. A 4.0 cm x 2.6 cm fat containing left inguinal hernia is also seen. Musculoskeletal: Degenerative changes are noted throughout the lumbar spine.  Review of the MIP images confirms the above findings. IMPRESSION: 1. No evidence of pulmonary embolus. 2. Marked severity coronary artery calcification. 3. Stable 5 mm noncalcified lung nodule along the lateral aspect of the major fissure on the right. 4. Marked severity cortical thinning along the anterior aspect of the mid and upper right kidney. 5. Fat containing umbilical and left inguinal hernias. 6. Mildly enlarged prostate gland. Aortic Atherosclerosis (ICD10-I70.0). Electronically Signed   By: Virgina Norfolk M.D.   On: 10/16/2019 16:47   DG Chest Port 1 View  Result Date: 10/16/2019 CLINICAL DATA:  Of breath x3 days. EXAM: PORTABLE CHEST 1 VIEW COMPARISON:  October 15, 2019 FINDINGS: There is a dual lead AICD. Multiple sternal wires are noted. Mild, diffuse chronic appearing increased lung markings are seen. There is no evidence of acute infiltrate, pleural  effusion or pneumothorax. The cardiac silhouette is borderline in size. An artificial cardiac valve is seen. There is mild calcification of the aortic arch. Multilevel degenerative changes seen throughout the thoracic spine. IMPRESSION: Chronic appearing increased lung markings without evidence of acute or active cardiopulmonary disease. Electronically Signed   By: Virgina Norfolk M.D.   On: 10/16/2019 15:04   CT MAXILLOFACIAL WO CONTRAST  Result Date: 10/17/2019 CLINICAL DATA:  Cough. EXAM: CT MAXILLOFACIAL WITHOUT CONTRAST TECHNIQUE: Multidetector CT imaging of the maxillofacial structures was performed. Multiplanar CT image reconstructions were also generated. COMPARISON:  Head CT 09/03/2015 FINDINGS: The study is mildly motion degraded. Osseous: No fracture or destructive osseous process. Orbits: Unremarkable. Sinuses: 1.2 cm polyp or mucous retention cyst in the right maxillary sinus with the other sinuses being clear. No fluid. Patent ostiomeatal units. Paradoxical rotation of the right middle turbinate. 6 mm leftward deviation of an  intact nasal septum. Clear mastoid air cells and tympanic cavities. Soft tissues: Unremarkable. Limited intracranial: Unremarkable. IMPRESSION: 1. Small right maxillary sinus polyp or mucous retention cyst. Otherwise clear sinuses. 2. Leftward nasal septal deviation. Electronically Signed   By: Logan Bores M.D.   On: 10/17/2019 18:07    Patient Profile     TATSUYA OKRAY is a 69 y.o. male with a hx of persistent atrial fibrillation, hypertension, complete heart block status post pacemaker who is being seen today for the evaluation of atrial fibrillation  Assessment & Plan    1. Persistent atrial fibrillation:  Pt appears to remain in AF by EKG last night.  His QTc is ~450-460 when corrected for paced QRS and measured manually.  If he remains in AF, Selina Tapper likely require DCCV + TEE, as he has tolerated poorly in the past. Amsi Grimley discuss timing with MD.  Continue tikoysn 250 mcg BID Continue eliquis 5 mg BID for CHA2DS2VASC of at least 4     2. Acute COPD exacerbation:  Remains on ABX and IV steroids. Plan per primary team.   For questions or updates, please contact [place] Please consult [email or web address] for contact info under Cardiology/STEMI.  Signed, Shirley Friar, PA-C  10/18/2019, 7:42 AM   I have seen and examined this patient with Oda Kilts.  Agree with above, note added to reflect my findings.  On exam, irregular, no murmurs, lungs clear.  Patient remains in atrial fib/flutter. Respiratory status improved. Has been in AF with missed doses of eliquis. Cinderella Christoffersen thus require TEE to ensure no LAA thrombus. Natoria Archibald plan for tomorrow and hold tikosyn until TEE performed.  Aycen Porreca M. Delitha Elms MD 10/18/2019 10:37 AM

## 2019-10-18 NOTE — H&P (View-Only) (Signed)
Electrophysiology Rounding Note  Patient Name: [name] Date of Encounter: 10/18/2019  Primary Cardiologist: No primary care provider on file. Electrophysiologist: Thompson Grayer, MD   Subjective   He is feeling much better today.  He clarifies that he didn't run out of his medication, and still has stock at home, he was just having severe nausea in the setting of his SOB and unable to keep any of his medications down for about 3 days.   Inpatient Medications    Scheduled Meds:  amLODipine  5 mg Oral Daily   apixaban  5 mg Oral BID   bisoprolol  5 mg Oral Daily   cholecalciferol  8,000 Units Oral QHS   dofetilide  250 mcg Oral BID   doxycycline  100 mg Oral Q12H   gabapentin  1,200 mg Oral QHS   gabapentin  600 mg Oral BID WC   insulin aspart  0-5 Units Subcutaneous QHS   insulin aspart  0-9 Units Subcutaneous TID WC   levothyroxine  175 mcg Oral QAC breakfast   loratadine  10 mg Oral Daily   methylPREDNISolone (SOLU-MEDROL) injection  80 mg Intravenous Q12H   mometasone-formoterol  2 puff Inhalation BID   pantoprazole  40 mg Oral BID AC   rOPINIRole  2 mg Oral QHS   rosuvastatin  10 mg Oral QHS   spironolactone  25 mg Oral Daily   traMADol  50 mg Oral BID   traZODone  25 mg Oral QHS   Continuous Infusions:   PRN Meds: HYDROcodone-acetaminophen, ipratropium-albuterol, ondansetron, polyethylene glycol powder   Vital Signs    Vitals:   10/17/19 2054 10/17/19 2100 10/17/19 2345 10/18/19 0459  BP: 138/72  132/71 133/85  Pulse: 69  70 70  Resp:      Temp: 98.5 F (36.9 C)  98.2 F (36.8 C) 97.6 F (36.4 C)  TempSrc: Oral  Oral Oral  SpO2: 98% 99% 98% 99%  Weight:    87.5 kg  Height:        Intake/Output Summary (Last 24 hours) at 10/18/2019 0742 Last data filed at 10/18/2019 0504 Gross per 24 hour  Intake 1206.73 ml  Output 2025 ml  Net -818.27 ml   Filed Weights   10/16/19 2300 10/17/19 1236 10/18/19 0459  Weight: 85.6 kg 86.6 kg 87.5 kg     Physical Exam    GEN- The patient is well appearing, alert and oriented x 3 today.   Head- normocephalic, atraumatic Eyes-  Sclera clear, conjunctiva pink Ears- hearing intact Oropharynx- clear Neck- supple Lungs- Clear to ausculation bilaterally, normal work of breathing Heart- Regular rate and rhythm (V pacing by tele), no murmurs, rubs or gallops GI- soft, NT, ND, + BS Extremities- no clubbing, cyanosis, or edema Skin- no rash or lesion Psych- euthymic mood, full affect Neuro- strength and sensation are intact  Labs    CBC Recent Labs    10/16/19 1312 10/16/19 1341 10/16/19 1342 10/17/19 0653  WBC 17.3*  --   --  10.3  NEUTROABS 15.0*  --   --   --   HGB 14.5   < > 14.3 11.9*  HCT 44.7   < > 42.0 38.7*  MCV 92.5  --   --  96.0  PLT 226  --   --  147*   < > = values in this interval not displayed.   Basic Metabolic Panel Recent Labs    10/17/19 0653 10/18/19 0444  NA 138 136  K 5.2*  4.8  CL 107 105  CO2 22 23  GLUCOSE 156* 162*  BUN 27* 27*  CREATININE 1.30* 1.34*  CALCIUM 8.6* 8.6*  MG 2.6* 2.2   Liver Function Tests Recent Labs    10/16/19 1312  AST 26  ALT 21  ALKPHOS 68  BILITOT 1.2  PROT 7.4  ALBUMIN 4.2   Recent Labs    10/16/19 1312  LIPASE 80*   Cardiac Enzymes No results for input(s): CKTOTAL, CKMB, CKMBINDEX, TROPONINI in the last 72 hours.   Telemetry    V pacing (personally reviewed)  Radiology    CT Angio Chest PE W and/or Wo Contrast  Result Date: 10/16/2019 CLINICAL DATA:  Cough and shortness of breath with vomiting. EXAM: CT ANGIOGRAPHY CHEST CT ABDOMEN AND PELVIS WITH CONTRAST TECHNIQUE: Multidetector CT imaging of the chest was performed using the standard protocol during bolus administration of intravenous contrast. Multiplanar CT image reconstructions and MIPs were obtained to evaluate the vascular anatomy. Multidetector CT imaging of the abdomen and pelvis was performed using the standard protocol during bolus  administration of intravenous contrast. CONTRAST:  35mL OMNIPAQUE IOHEXOL 350 MG/ML SOLN COMPARISON:  Sep 09, 2019 FINDINGS: CTA CHEST FINDINGS Cardiovascular: There is moderate severity calcification of the aortic arch. Satisfactory opacification of the pulmonary arteries to the segmental level. No evidence of pulmonary embolism. Normal heart size. No pericardial effusion. Marked severity coronary artery calcification is seen. Mediastinum/Nodes: No enlarged mediastinal, hilar, or axillary lymph nodes. The trachea and esophagus demonstrate no significant findings. Lungs/Pleura: Very mild atelectasis is seen along the medial aspect of the right upper lobe. A stable 6 mm calcified lung nodule is seen within the right upper lobe. A stable 5 mm noncalcified lung nodule is seen along the lateral aspect of the major fissure on the right (axial CT image 81, CT series number 6). There is no evidence of a pleural effusion or pneumothorax. Musculoskeletal: Multilevel degenerative changes seen throughout the thoracic spine. Review of the MIP images confirms the above findings. CT ABDOMEN and PELVIS FINDINGS Hepatobiliary: No focal liver abnormality is seen. No gallstones, gallbladder wall thickening, or biliary dilatation. Pancreas: Unremarkable. No pancreatic ductal dilatation or surrounding inflammatory changes. Spleen: The spleen is mildly enlarged. Adrenals/Urinary Tract: Adrenal glands are unremarkable. There is marked severity cortical thinning seen along the anterior aspect of the mid and upper right kidney. The left kidney is normal in size. There is no evidence of renal calculi or hydronephrosis. A 1.5 cm cystic appearing area is seen within the posterior aspect of the mid right kidney, with an additional subcentimeter cyst seen within the lower pole of the left kidney. Bladder is unremarkable. Stomach/Bowel: Stomach is within normal limits. The appendix is not clearly identified. No evidence of bowel wall thickening,  distention, or inflammatory changes. Vascular/Lymphatic: There is moderate severity calcification of the abdominal aorta. No enlarged abdominal or pelvic lymph nodes. Reproductive: The prostate gland is mildly enlarged. Other: There is a 3.2 cm x 1.5 cm fat containing umbilical hernia. A 4.0 cm x 2.6 cm fat containing left inguinal hernia is also seen. Musculoskeletal: Degenerative changes are noted throughout the lumbar spine. Review of the MIP images confirms the above findings. IMPRESSION: 1. No evidence of pulmonary embolus. 2. Marked severity coronary artery calcification. 3. Stable 5 mm noncalcified lung nodule along the lateral aspect of the major fissure on the right. 4. Marked severity cortical thinning along the anterior aspect of the mid and upper right kidney. 5. Fat containing umbilical and left  inguinal hernias. 6. Mildly enlarged prostate gland. Aortic Atherosclerosis (ICD10-I70.0). Electronically Signed   By: Virgina Norfolk M.D.   On: 10/16/2019 16:46   CT ABDOMEN PELVIS W CONTRAST  Result Date: 10/16/2019 CLINICAL DATA:  Shortness of breath with abdominal pain and neutropenia. EXAM: CT ANGIOGRAPHY CHEST CT ABDOMEN AND PELVIS WITH CONTRAST TECHNIQUE: Multidetector CT imaging of the chest was performed using the standard protocol during bolus administration of intravenous contrast. Multiplanar CT image reconstructions and MIPs were obtained to evaluate the vascular anatomy. Multidetector CT imaging of the abdomen and pelvis was performed using the standard protocol during bolus administration of intravenous contrast. CONTRAST:  55mL OMNIPAQUE IOHEXOL 350 MG/ML SOLN COMPARISON:  None. FINDINGS: CTA CHEST FINDINGS Cardiovascular: There is moderate severity calcification of the aortic arch. Satisfactory opacification of the pulmonary arteries to the segmental level. No evidence of pulmonary embolism. Normal heart size. No pericardial effusion. Marked severity coronary artery calcification is seen.  Mediastinum/Nodes: No enlarged mediastinal, hilar, or axillary lymph nodes. The trachea and esophagus demonstrate no significant findings. Lungs/Pleura: Very mild atelectasis is seen along the medial aspect of the right upper lobe. A stable 6 mm calcified lung nodule is seen within the right upper lobe. A stable 5 mm noncalcified lung nodule is seen along the lateral aspect of the major fissure on the right (axial CT image 81, CT series number 6). There is no evidence of a pleural effusion or pneumothorax. Musculoskeletal: Multilevel degenerative changes seen throughout the thoracic spine. Review of the MIP images confirms the above findings. CT ABDOMEN and PELVIS FINDINGS Hepatobiliary: No focal liver abnormality is seen. No gallstones, gallbladder wall thickening, or biliary dilatation. Pancreas: Unremarkable. No pancreatic ductal dilatation or surrounding inflammatory changes. Spleen: The spleen is mildly enlarged. Adrenals/Urinary Tract: Adrenal glands are unremarkable. There is marked severity cortical thinning seen along the anterior aspect of the mid and upper right kidney. The left kidney is normal in size. There is no evidence of renal calculi or hydronephrosis. A 1.5 cm cystic appearing area is seen within the posterior aspect of the mid right kidney, with an additional subcentimeter cyst seen within the lower pole of the left kidney. Bladder is unremarkable. Stomach/Bowel: Stomach is within normal limits. The appendix is not clearly identified. No evidence of bowel wall thickening, distention, or inflammatory changes. Vascular/Lymphatic: There is moderate severity calcification of the abdominal aorta. No enlarged abdominal or pelvic lymph nodes. Reproductive: The prostate gland is mildly enlarged. Other: There is a 3.2 cm x 1.5 cm fat containing umbilical hernia. A 4.0 cm x 2.6 cm fat containing left inguinal hernia is also seen. Musculoskeletal: Degenerative changes are noted throughout the lumbar spine.  Review of the MIP images confirms the above findings. IMPRESSION: 1. No evidence of pulmonary embolus. 2. Marked severity coronary artery calcification. 3. Stable 5 mm noncalcified lung nodule along the lateral aspect of the major fissure on the right. 4. Marked severity cortical thinning along the anterior aspect of the mid and upper right kidney. 5. Fat containing umbilical and left inguinal hernias. 6. Mildly enlarged prostate gland. Aortic Atherosclerosis (ICD10-I70.0). Electronically Signed   By: Virgina Norfolk M.D.   On: 10/16/2019 16:47   DG Chest Port 1 View  Result Date: 10/16/2019 CLINICAL DATA:  Of breath x3 days. EXAM: PORTABLE CHEST 1 VIEW COMPARISON:  October 15, 2019 FINDINGS: There is a dual lead AICD. Multiple sternal wires are noted. Mild, diffuse chronic appearing increased lung markings are seen. There is no evidence of acute infiltrate, pleural  effusion or pneumothorax. The cardiac silhouette is borderline in size. An artificial cardiac valve is seen. There is mild calcification of the aortic arch. Multilevel degenerative changes seen throughout the thoracic spine. IMPRESSION: Chronic appearing increased lung markings without evidence of acute or active cardiopulmonary disease. Electronically Signed   By: Virgina Norfolk M.D.   On: 10/16/2019 15:04   CT MAXILLOFACIAL WO CONTRAST  Result Date: 10/17/2019 CLINICAL DATA:  Cough. EXAM: CT MAXILLOFACIAL WITHOUT CONTRAST TECHNIQUE: Multidetector CT imaging of the maxillofacial structures was performed. Multiplanar CT image reconstructions were also generated. COMPARISON:  Head CT 09/03/2015 FINDINGS: The study is mildly motion degraded. Osseous: No fracture or destructive osseous process. Orbits: Unremarkable. Sinuses: 1.2 cm polyp or mucous retention cyst in the right maxillary sinus with the other sinuses being clear. No fluid. Patent ostiomeatal units. Paradoxical rotation of the right middle turbinate. 6 mm leftward deviation of an  intact nasal septum. Clear mastoid air cells and tympanic cavities. Soft tissues: Unremarkable. Limited intracranial: Unremarkable. IMPRESSION: 1. Small right maxillary sinus polyp or mucous retention cyst. Otherwise clear sinuses. 2. Leftward nasal septal deviation. Electronically Signed   By: Logan Bores M.D.   On: 10/17/2019 18:07    Patient Profile     TEAL BONTRAGER is a 69 y.o. male with a hx of persistent atrial fibrillation, hypertension, complete heart block status post pacemaker who is being seen today for the evaluation of atrial fibrillation  Assessment & Plan    1. Persistent atrial fibrillation:  Pt appears to remain in AF by EKG last night.  His QTc is ~450-460 when corrected for paced QRS and measured manually.  If he remains in AF, will likely require DCCV + TEE, as he has tolerated poorly in the past. Will discuss timing with MD.  Continue tikoysn 250 mcg BID Continue eliquis 5 mg BID for CHA2DS2VASC of at least 4     2. Acute COPD exacerbation:  Remains on ABX and IV steroids. Plan per primary team.   For questions or updates, please contact [facility] Please consult [email or web address] for contact info under Cardiology/STEMI.  Signed, Shirley Friar, PA-C  10/18/2019, 7:42 AM   I have seen and examined this patient with Oda Kilts.  Agree with above, note added to reflect my findings.  On exam, irregular, no murmurs, lungs clear.  Patient remains in atrial fib/flutter. Respiratory status improved. Has been in AF with missed doses of eliquis. Will thus require TEE to ensure no LAA thrombus. Will plan for tomorrow and hold tikosyn until TEE performed.  Will M. Camnitz MD 10/18/2019 10:37 AM

## 2019-10-18 NOTE — Progress Notes (Addendum)
Pharmacy: Dofetilide (Tikosyn) - Follow Up Assessment and Electrolyte Replacement  Pharmacy consulted to assist in monitoring and replacing electrolytes in this 69 y.o. male admitted on 10/16/2019 undergoing dofetilide re-initiation. First dofetilide dose: 6/19@2200 .  Labs:    Component Value Date/Time   K 4.8 10/18/2019 0444   MG 2.2 10/18/2019 0444     Plan: Potassium: K >/= 4: No additional supplementation needed  Magnesium: Mg > 2: No additional supplementation needed   Thank you for allowing pharmacy to participate in this patient's care   Antonietta Jewel, PharmD, [facility] Pharmacist  Phone: [phone] 10/18/2019 7:29 AM  Please check AMION for all [facility] phone numbers After 10:00 PM, call [facility] [phone]  ADDENDUM Apixaban doses missed prior to admission - plan for holding tikosyn at this time and getting DCCV/TEE on 6/22.  Antonietta Jewel, PharmD, [facility] Clinical Pharmacist

## 2019-10-18 NOTE — Progress Notes (Signed)
PCCM Interval Progress Note  Pulmonary follow up appointment made for Friday 11/05/19 at 3:15 PM with Geraldo Pitter, NP.   Please call us back if we can be of further assistance prior to discharge.   Montey Hora, [facility] 10/18/2019, 11:15 AM

## 2019-10-18 NOTE — Progress Notes (Signed)
PROGRESS NOTE  [name]  DOB: 01-23-1951  PCP: Velna Hatchet, MD TDV:[id number]  DOA: 10/16/2019  LOS: 2 days   No chief complaint on file.  Brief narrative: [name] is a 69 y.o. 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 fib on Tikosyn and systemic anticoagulation, IIDM. Patient presented to the ED on 6/19 with increasing short of breath, wheezing, cough and poor oral intake for 3 days.  Denies any fever chills.  Patient came to ED a day earlier for same acute issue and was discharged with p.o. steroid.his symptoms persisted and he was also not able to take his regular medications including Tikosyn.  He returned back to ED on 6/19.    In the ED, patient was tachypneic, hypoxic and required BiPAP. Labs showed creatinine 1.66, troponin slightly elevated to 23, initial lactic acid was elevated to 5.8, WBC count elevated to 17.3. Chest x-ray showed chronic appearing increased lung markings without evidence of acute or active cardiopulmonary disease CT angio of chest did not show any pulmonary embolism, infiltrate, effusion or edema.  It showed marked severity coronary artery calcification and a stable 5 mm noncalcified lung nodule on the right. Patient was admitted to hospitalist service for further evaluation and management.  Subjective: Patient was seen and examined this morning.  Lying down in bed.  Breathing better.  Cough is improving.    Chart reviewed. Lactic acid trending down, most recent 3.5 this morning. Creatinine trending downward as well. 1.34 today.  Assessment/Plan: Acute on chronic respiratory failure with hypoxia Acute exacerbation of advanced COPD Recurrent COPD flareup -Presented with shortness of breath wheezing, cough. -Home meds include Pulmicort, SABA SAMA.  -Not on Mucinex on chronic steroids at home.  Has not seen a pulmonologist in a long time.   -Chest imaging did not show any acute  infiltrate. Patient probably has viral bronchitis versus atypical pneumonia. -On admission, patient was started on short course of p.o. doxycycline and IV steroids.  I will continue the same.  Also continue Mucinex, bronchodilators, flutter valves. -I added scheduled Mucinex this morning. -Pulmonary consultation appreciated. -Continue supplemental oxygen.  Lactic acidosis -Initial lactic acid level elevated to 5.5, slowly trending down but still remains elevated, most recent was 3.5 last night.   -Clinically not in sepsis.  Lactic acid elevated probably secondary to anaerobic respiration.  WBC count was elevated likely because of steroids.   -Repeat lactic acid level tomorrow.  Paroxysmal A. Fib -Home meds include Tikosyn 250 mg twice daily and metoprolol 50 mg twice daily along with Eliquis 5 mg daily.   -All medications have been resumed.  Since Tikosyn has been reinitiated after a pause, patient may need to be monitored for few days.   -Cardiology consultation appreciated.  QTC being monitored..  Essential hypertension -Home meds include amlodipine 10 mg daily, metoprolol 50 mg twice daily, torsemide 40 mg every other day Aldactone 25 mg daily. -Amlodipine resumed. Metoprolol was switched to bisoprolol. Because of AKI, keep torsemide, Aldactone as well as potassium and magnesium supplements are on hold.    AKI on CKD IIIb -Creatinine at baseline seems to be between 1.3-1.5. -Presented with creatinine of 1.66.  Slowly improving with hydration. -Creatinine this morning is 1.34.  Continue to monitor.  Hypothyroidism -Continue Synthroid  Other home meds also include include Neurontin 600 mg, 600 mg and 1200 mg, Norco as needed twice daily, Protonix 40 mg daily, Requip 2 mg at bedtime and  Trazodone 12.5 mg at bedtime. Continue all.  Mobility: Ambulates independently at home.  Encourage ambulation Code Status:   Code Status: Full Code  DVT prophylaxis:   apixaban (ELIQUIS) tablet 5  mg  Antimicrobials:  Doxycycline oral Fluid: Not on IV fluid.  Diet:  Diet Order            Diet Heart Room service appropriate? Yes; Fluid consistency: Thin  Diet effective now                 Consultants: Cardiology, pulmonology Family Communication:  None at bedside  Status is: Inpatient  Remains inpatient appropriate because: patient has been reinitiated on Tikosyn and needs to stay in the hospital for few days for monitoring QTC   Dispo: The patient is from: Home              Anticipated d/c is to: Home              Anticipated d/c date is: 3 days              Patient currently is not medically stable to d/c.        Infusions:    Scheduled Meds: . amLODipine  5 mg Oral Daily  . apixaban  5 mg Oral BID  . bisoprolol  5 mg Oral Daily  . cholecalciferol  8,000 Units Oral QHS  . doxycycline  100 mg Oral Q12H  . gabapentin  1,200 mg Oral QHS  . gabapentin  600 mg Oral BID WC  . insulin aspart  0-5 Units Subcutaneous QHS  . insulin aspart  0-9 Units Subcutaneous TID WC  . levothyroxine  175 mcg Oral QAC breakfast  . loratadine  10 mg Oral Daily  . methylPREDNISolone (SOLU-MEDROL) injection  80 mg Intravenous Q12H  . mometasone-formoterol  2 puff Inhalation BID  . pantoprazole  40 mg Oral BID AC  . rOPINIRole  2 mg Oral QHS  . rosuvastatin  10 mg Oral QHS  . spironolactone  25 mg Oral Daily  . traMADol  50 mg Oral BID  . traZODone  25 mg Oral QHS    Antimicrobials: Anti-infectives (From admission, onward)   Start     Dose/Rate Route Frequency Ordered Stop   10/17/19 1000  azithromycin (ZITHROMAX) tablet 250 mg  Status:  Discontinued       "Followed by" Linked Group Details   250 mg Oral Daily 10/16/19 2003 10/16/19 2014   10/17/19 0330  vancomycin (VANCOREADY) IVPB 750 mg/150 mL  Status:  Discontinued        750 mg 100 mL/hr over 90 Minutes Intravenous Every 12 hours 10/16/19 1416 10/16/19 2002   10/16/19 2200  doxycycline (VIBRA-TABS) tablet 100 mg      Discontinue     100 mg Oral Every 12 hours 10/16/19 2014     10/16/19 2015  azithromycin (ZITHROMAX) tablet 500 mg  Status:  Discontinued       "Followed by" Linked Group Details   500 mg Oral Daily 10/16/19 2003 10/16/19 2014   10/16/19 1415  ceFEPIme (MAXIPIME) 2 g in sodium chloride 0.9 % 100 mL IVPB  Status:  Discontinued        2 g 200 mL/hr over 30 Minutes Intravenous  Once 10/16/19 1401 10/16/19 1414   10/16/19 1415  metroNIDAZOLE (FLAGYL) IVPB 500 mg        500 mg 100 mL/hr over 60 Minutes Intravenous  Once 10/16/19 1401 10/16/19 1531   10/16/19 1415  vancomycin (VANCOCIN) IVPB 1000 mg/200 mL premix  Status:  Discontinued        1,000 mg 200 mL/hr over 60 Minutes Intravenous  Once 10/16/19 1401 10/16/19 1412   10/16/19 1415  vancomycin (VANCOREADY) IVPB 1750 mg/350 mL        1,750 mg 116.7 mL/hr over 180 Minutes Intravenous  Once 10/16/19 1412 10/16/19 1856   10/16/19 1415  ceFEPIme (MAXIPIME) 2 g in sodium chloride 0.9 % 100 mL IVPB  Status:  Discontinued        2 g 200 mL/hr over 30 Minutes Intravenous Every 12 hours 10/16/19 1414 10/16/19 2002      PRN meds: HYDROcodone-acetaminophen, ipratropium-albuterol, ondansetron, polyethylene glycol powder   Objective: Vitals:   10/18/19 0824 10/18/19 0833  BP:  134/62  Pulse:  68  Resp:  16  Temp:  (!) 97.5 F (36.4 C)  SpO2: 97% 97%    Intake/Output Summary (Last 24 hours) at 10/18/2019 1122 Last data filed at 10/18/2019 0744 Gross per 24 hour  Intake 1206.73 ml  Output 2550 ml  Net -1343.27 ml   Filed Weights   10/16/19 2300 10/17/19 1236 10/18/19 0459  Weight: 85.6 kg 86.6 kg 87.5 kg   Weight change: 1 kg Body mass index is 26.9 kg/m.   Physical Exam: General exam: Appears calm and comfortable.  Lying on bed.  Not in physical distress except for intermittent cough spells Skin: No rashes, lesions or ulcers. HEENT: Atraumatic, normocephalic, supple neck, no obvious bleeding Lungs: Clear to auscultation  bilaterally.  No crackles or wheezing. CVS: Regular rate and rhythm, no murmur GI/Abd soft, nontender, nondistended, bowel sound present CNS: Alert, awake, oriented x3 Psychiatry: Mood appropriate Extremities: No pedal edema, no calf tenderness  Data Review: I have personally reviewed the laboratory data and studies available.  Recent Labs  Lab 10/15/19 0305 10/16/19 1312 10/16/19 1341 10/16/19 1342 10/17/19 0653  WBC 10.0 17.3*  --   --  10.3  NEUTROABS 8.7* 15.0*  --   --   --   HGB 13.2 14.5 14.6 14.3 11.9*  HCT 40.8 44.7 43.0 42.0 38.7*  MCV 92.1 92.5  --   --  96.0  PLT 131* 226  --   --  147*   Recent Labs  Lab 10/15/19 0305 10/15/19 0305 10/16/19 1312 10/16/19 1312 10/16/19 1341 10/16/19 1342 10/16/19 2250 10/17/19 0653 10/18/19 0444  NA 135   < > 138   < > 139 139 139 138 136  K 4.5   < > 4.0   < > 3.8 3.7 4.2 5.2* 4.8  CL 100   < > 103  --  104  --  106 107 105  CO2 25  --  17*  --   --   --  20* 22 23  GLUCOSE 133*   < > 142*  --  155*  --  182* 156* 162*  BUN 18   < > 28*  --  29*  --  28* 27* 27*  CREATININE 1.38*   < > 1.66*  --  1.40*  --  1.54* 1.30* 1.34*  CALCIUM 9.4  --  9.9  --   --   --  8.4* 8.6* 8.6*  MG  --   --   --   --   --   --  2.8* 2.6* 2.2   < > = values in this interval not displayed.    Signed, Terrilee Croak, MD [facility] Pager: [phone] (Secure Chat  preferred). 10/18/2019

## 2019-10-18 NOTE — Progress Notes (Addendum)
Nutrition Brief Note  Received nutrition consult per COPD protocol.  Patient reports that he had been eating poorly for 2-3 days PTA due to vomiting. Prior to that time, he was eating well. Since admission, he has regained his appetite and has been eating well. He is consuming 100% of meals.  Usual weights reviewed. Patient has lost 4% of usual weight within the past 4 months, which is not significant for the time frame. Current weight is above usual weight.   Body mass index is 26.9 kg/m. Patient meets criteria for overweight based on current BMI.   Current diet order is heart healthy, patient is consuming approximately 100% of meals at this time. Labs and medications reviewed.   Nutrition focused physical exam completed.  No muscle or subcutaneous fat depletion noticed.  No nutrition interventions warranted at this time. If nutrition issues arise, please consult RD.   Lucas Mallow, RD, LDN, CNSC Please refer to [facility] for contact information.

## 2019-10-18 NOTE — Plan of Care (Signed)

## 2019-10-19 ENCOUNTER — Inpatient Hospital Stay (HOSPITAL_COMMUNITY): Payer: Medicare Other

## 2019-10-19 ENCOUNTER — Inpatient Hospital Stay (HOSPITAL_COMMUNITY): Payer: Medicare Other | Admitting: Anesthesiology

## 2019-10-19 ENCOUNTER — Encounter (HOSPITAL_COMMUNITY): Payer: Self-pay | Admitting: Internal Medicine

## 2019-10-19 ENCOUNTER — Encounter (HOSPITAL_COMMUNITY)
Admission: EM | Disposition: A | Discharge status: Home / Self Care | Payer: Self-pay | Source: Home / Self Care | Attending: Internal Medicine

## 2019-10-19 DIAGNOSIS — I4891 Unspecified atrial fibrillation: Secondary | ICD-10-CM

## 2019-10-19 HISTORY — PX: TEE WITHOUT CARDIOVERSION: SHX5443

## 2019-10-19 HISTORY — PX: CARDIOVERSION: SHX1299

## 2019-10-19 LAB — BASIC METABOLIC PANEL
Anion gap: 9 (ref 5–15)
BUN: 30 mg/dL — ABNORMAL HIGH (ref 8–23)
CO2: 25 mmol/L (ref 22–32)
Calcium: 9 mg/dL (ref 8.9–10.3)
Chloride: 99 mmol/L (ref 98–111)
Creatinine, Ser: 1.37 mg/dL — ABNORMAL HIGH (ref 0.61–1.24)
GFR calc Af Amer: >60 mL/min (ref >60)
GFR calc non Af Amer: 52 mL/min — ABNORMAL LOW (ref >60)
Glucose, Bld: 151 mg/dL — ABNORMAL HIGH (ref 70–99)
Potassium: 4.9 mmol/L (ref 3.5–5.1)
Sodium: 133 mmol/L — ABNORMAL LOW (ref 135–145)

## 2019-10-19 LAB — GLUCOSE, CAPILLARY
Glucose-Capillary: 152 mg/dL — ABNORMAL HIGH (ref 70–99)
Glucose-Capillary: 142 mg/dL — ABNORMAL HIGH (ref 70–99)
Glucose-Capillary: 163 mg/dL — ABNORMAL HIGH (ref 70–99)
Glucose-Capillary: 213 mg/dL — ABNORMAL HIGH (ref 70–99)
Glucose-Capillary: 172 mg/dL — ABNORMAL HIGH (ref 70–99)
Glucose-Capillary: 168 mg/dL — ABNORMAL HIGH (ref 70–99)

## 2019-10-19 LAB — MAGNESIUM: Magnesium: 2.2 mg/dL (ref 1.7–2.4)

## 2019-10-19 SURGERY — ECHOCARDIOGRAM, TRANSESOPHAGEAL
Anesthesia: General

## 2019-10-19 MED ORDER — SODIUM CHLORIDE 0.9 % IV SOLN
INTRAVENOUS | Status: DC | PRN
Start: 2019-10-19 — End: 2019-10-19

## 2019-10-19 MED ORDER — LIDOCAINE 2% (20 MG/ML) 5 ML SYRINGE
INTRAMUSCULAR | Status: DC | PRN
  Administered 2019-10-19: 60 mg via INTRAVENOUS

## 2019-10-19 MED ORDER — PROPOFOL 500 MG/50ML IV EMUL
INTRAVENOUS | Status: DC | PRN
Start: 2019-10-19 — End: 2019-10-19
  Administered 2019-10-19: 150 ug/kg/min via INTRAVENOUS

## 2019-10-19 MED ORDER — DOFETILIDE 250 MCG PO CAPS
250.0000 ug | ORAL_CAPSULE | Freq: Two times a day (BID) | ORAL | Status: DC
  Administered 2019-10-19 (×2): 250 ug via ORAL
  Filled 2019-10-19 (×2): qty 1

## 2019-10-19 MED ORDER — METHYLPREDNISOLONE SODIUM SUCC 125 MG IJ SOLR
60.0000 mg | Freq: Three times a day (TID) | INTRAMUSCULAR | Status: DC
  Administered 2019-10-19 – 2019-10-20 (×3): 60 mg via INTRAVENOUS
  Filled 2019-10-19 (×3): qty 2

## 2019-10-19 NOTE — Progress Notes (Signed)
Chaplain responded to Spiritual Consult for delivery and explanation of AD.  Met patient's wife in the room awaiting her husband's return from a consult elsewhere.  Chaplain established a relationship of presence with wife who recalled we had met before when her best friend's husband died.  We talked about how death and grief changes people.  Chaplain left two Ads:  One for patient and one for his wife.  Chaplain will follow up to notarize the documents.  De Burrs Chaplain Resident

## 2019-10-19 NOTE — Anesthesia Preprocedure Evaluation (Signed)
Anesthesia Evaluation  Patient identified by MRN, date of birth, ID band Patient awake    Reviewed: Allergy & Precautions, H&P , NPO status , Patient's Chart, lab work & pertinent test results, reviewed documented beta blocker date and time   Airway Mallampati: I  TM Distance: >3 FB Neck ROM: Full    Dental no notable dental hx. (+) Edentulous Upper, Dental Advisory Given   Pulmonary asthma , sleep apnea , COPD,  COPD inhaler, former smoker,    Pulmonary exam normal breath sounds clear to auscultation       Cardiovascular hypertension, Pt. on medications and Pt. on home beta blockers + CAD and +CHF  + dysrhythmias Atrial Fibrillation + pacemaker  Rhythm:Regular Rate:Normal     Neuro/Psych negative neurological ROS  negative psych ROS   GI/Hepatic Neg liver ROS, GERD  Medicated and Controlled,  Endo/Other  diabetesHypothyroidism   Renal/GU negative Renal ROS  negative genitourinary   Musculoskeletal  (+) Arthritis , Osteoarthritis,  Fibromyalgia -  Abdominal   Peds  Hematology negative hematology ROS (+)   Anesthesia Other Findings   Reproductive/Obstetrics negative OB ROS                             Anesthesia Physical Anesthesia Plan  ASA: III  Anesthesia Plan: General   Post-op Pain Management:    Induction: Intravenous  PONV Risk Score and Plan: 2 and Propofol infusion and Treatment may vary due to age or medical condition  Airway Management Planned: Nasal Cannula  Additional Equipment:   Intra-op Plan:   Post-operative Plan:   Informed Consent: I have reviewed the patients History and Physical, chart, labs and discussed the procedure including the risks, benefits and alternatives for the proposed anesthesia with the patient or authorized representative who has indicated his/her understanding and acceptance.     Dental advisory given  Plan Discussed with:  CRNA  Anesthesia Plan Comments:         Anesthesia Quick Evaluation

## 2019-10-19 NOTE — Progress Notes (Signed)
Echocardiogram Echocardiogram Transesophageal has been performed.  Oneal Deputy Sakura Denis 10/19/2019, 11:18 AM

## 2019-10-19 NOTE — Progress Notes (Addendum)
Electrophysiology Rounding Note  Patient Name: [name] Date of Encounter: 10/19/2019  Primary Cardiologist: No primary care provider on file. Electrophysiologist: Thompson Grayer, MD   Subjective   Pt is feeling well today. He is NPO this am for TEE/DCCV. Tikosyn held last night with missed doses of Eliquis  Inpatient Medications    Scheduled Meds: . amLODipine  5 mg Oral Daily  . apixaban  5 mg Oral BID  . bisoprolol  5 mg Oral Daily  . cholecalciferol  8,000 Units Oral QHS  . doxycycline  100 mg Oral Q12H  . feeding supplement (ENSURE ENLIVE)  237 mL Oral BID BM  . gabapentin  1,200 mg Oral QHS  . gabapentin  600 mg Oral BID WC  . insulin aspart  0-5 Units Subcutaneous QHS  . insulin aspart  0-9 Units Subcutaneous TID WC  . levothyroxine  175 mcg Oral QAC breakfast  . loratadine  10 mg Oral Daily  . methylPREDNISolone (SOLU-MEDROL) injection  80 mg Intravenous Q12H  . mometasone-formoterol  2 puff Inhalation BID  . pantoprazole  40 mg Oral BID AC  . rOPINIRole  2 mg Oral QHS  . rosuvastatin  10 mg Oral QHS  . spironolactone  25 mg Oral Daily  . traMADol  50 mg Oral BID  . traZODone  25 mg Oral QHS   Continuous Infusions:  PRN Meds: HYDROcodone-acetaminophen, ipratropium-albuterol, ondansetron, polyethylene glycol powder   Vital Signs    Vitals:   10/18/19 2117 10/18/19 2211 10/19/19 0446 10/19/19 0748  BP:  139/81 (!) 147/83 (!) 142/90  Pulse:  70 70 70  Resp:    16  Temp:  98.1 F (36.7 C) 97.9 F (36.6 C) 97.8 F (36.6 C)  TempSrc:  Oral Oral Oral  SpO2: 100% 99% 99% 99%  Weight:   87.1 kg   Height:        Intake/Output Summary (Last 24 hours) at 10/19/2019 0821 Last data filed at 10/19/2019 0643 Gross per 24 hour  Intake -  Output 850 ml  Net -850 ml   Filed Weights   10/17/19 1236 10/18/19 0459 10/19/19 0446  Weight: 86.6 kg 87.5 kg 87.1 kg    Physical Exam    General: Well appearing. No resp difficulty. HEENT: Normal Neck:  Supple. JVP 5-6. Carotids 2+ bilat; no bruits. No thyromegaly or nodule noted. Cor: PMI nondisplaced. Regular (V pacing) No M/G/R noted Lungs: CTAB, normal effort. Abdomen: Soft, non-tender, non-distended, no HSM. No bruits or masses. +BS  Extremities: No cyanosis, clubbing, or rash. R and LLE no edema.  Neuro: Alert & orientedx3, cranial nerves grossly intact. moves all 4 extremities w/o difficulty. Affect pleasant   Labs    CBC Recent Labs    10/16/19 1312 10/16/19 1341 10/16/19 1342 10/17/19 0653  WBC 17.3*  --   --  10.3  NEUTROABS 15.0*  --   --   --   HGB 14.5   < > 14.3 11.9*  HCT 44.7   < > 42.0 38.7*  MCV 92.5  --   --  96.0  PLT 226  --   --  147*   < > = values in this interval not displayed.   Basic Metabolic Panel Recent Labs    10/18/19 0444 10/19/19 0452  NA 136 133*  K 4.8 4.9  CL 105 99  CO2 23 25  GLUCOSE 162* 151*  BUN 27* 30*  CREATININE 1.34* 1.37*  CALCIUM 8.6* 9.0  MG 2.2 2.2  Liver Function Tests Recent Labs    10/16/19 1312  AST 26  ALT 21  ALKPHOS 68  BILITOT 1.2  PROT 7.4  ALBUMIN 4.2   Recent Labs    10/16/19 1312  LIPASE 80*   Cardiac Enzymes No results for input(s): CKTOTAL, CKMB, CKMBINDEX, TROPONINI in the last 72 hours.   Telemetry    V pacing 70-90s (personally reviewed)  Radiology    CT MAXILLOFACIAL WO CONTRAST  Result Date: 10/17/2019 CLINICAL DATA:  Cough. EXAM: CT MAXILLOFACIAL WITHOUT CONTRAST TECHNIQUE: Multidetector CT imaging of the maxillofacial structures was performed. Multiplanar CT image reconstructions were also generated. COMPARISON:  Head CT 09/03/2015 FINDINGS: The study is mildly motion degraded. Osseous: No fracture or destructive osseous process. Orbits: Unremarkable. Sinuses: 1.2 cm polyp or mucous retention cyst in the right maxillary sinus with the other sinuses being clear. No fluid. Patent ostiomeatal units. Paradoxical rotation of the right middle turbinate. 6 mm leftward deviation of an  intact nasal septum. Clear mastoid air cells and tympanic cavities. Soft tissues: Unremarkable. Limited intracranial: Unremarkable. IMPRESSION: 1. Small right maxillary sinus polyp or mucous retention cyst. Otherwise clear sinuses. 2. Leftward nasal septal deviation. Electronically Signed   By: Logan Bores M.D.   On: 10/17/2019 18:07    Patient Profile     [name] is a 69 y.o. male with a hx of persistent atrial fibrillation, hypertension, complete heart block status post pacemaker who is being seen today for the evaluation of atrial fibrillation  Assessment & Plan    1. Persistent atrial fibrillation/Paroxysmal atrial flutter h/o AF ablation 2013/[place] P waves difficult to discern on tele, but appears to remains in AF.  Tikosyn held with missed Eliquis.  Plan for TEE -> DCCV this am, and likely resume tikosyn this pm at previous dose of 250 mcg BID. Pt will then need to remain admitted for Tikosyn monitoring.  Continue eliquis 5 mg BID for CHA2DS2VASC of at least 4    He previously failed amiodarone due to suspected pulmonary toxicity in 2017 (mixed HF picture) Mild CAD by cath 2017 so not flecainide candidate, he has also failed it previously (2012) LA ID 35 mm by echo 10/2017.  2. Acute on chronic hypoxic respiratory failure/COPD exacerbation Remains on ABX and steroids. Per primary team.   3. A/S s/p AVR Stable on previous echo. TEE today for #1.   4. 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   5. Pulmonary HTN Primarily venous by previous RHC  For questions or updates, please contact [facility] Please consult [email or web address] for contact info under Cardiology/STEMI.  Signed, Shirley Friar, PA-C  10/19/2019, 8:21 AM   I have seen, examined the patient, and reviewed the above assessment and plan.  Changes to above are made where necessary.  On exam, RRR (paced).  He did well with TEE guided cardioversion.  We will resume tikosyn and follow  closely.  BP is stable at this time.  He has substantial MR, TR, and pulmonary hypertension.  His long term prognosis is guarded.  Co Sign: Thompson Grayer, MD 10/19/2019 3:50 PM

## 2019-10-19 NOTE — Transfer of Care (Signed)
Immediate Anesthesia Transfer of Care Note  Patient: [name]  Procedure(s) Performed: TRANSESOPHAGEAL ECHOCARDIOGRAM (TEE) (N/A ) CARDIOVERSION (N/A )  Patient Location: Endoscopy Unit  Anesthesia Type:MAC  Level of Consciousness: awake, alert  and oriented  Airway & Oxygen Therapy: Patient Spontanous Breathing and Patient connected to face mask oxygen  Post-op Assessment: Report given to RN and Post -op Vital signs reviewed and stable  Post vital signs: Reviewed and stable  Last Vitals:  Vitals Value Taken Time  BP 151/69 10/19/19 1118  Temp    Pulse 59 10/19/19 1118  Resp 18 10/19/19 1118  SpO2 98 % 10/19/19 1118  Vitals shown include unvalidated device data.  Last Pain:  Vitals:   10/19/19 0920  TempSrc: Oral  PainSc: 0-No pain      Patients Stated Pain Goal: 0 (61/47/09 2957)  Complications: No complications documented.

## 2019-10-19 NOTE — Interval H&P Note (Signed)
History and Physical Interval Note:  10/19/2019 9:40 AM  [name]  has presented today for surgery, with the diagnosis of afib/aflutter; tikosyn load, rule out clot.  The various methods of treatment have been discussed with the patient and family. After consideration of risks, benefits and other options for treatment, the patient has consented to  Procedure(s): TRANSESOPHAGEAL ECHOCARDIOGRAM (TEE) (N/A) CARDIOVERSION (N/A) as a surgical intervention.  The patient's history has been reviewed, patient examined, no change in status, stable for surgery.  I have reviewed the patient's chart and labs.  Questions were answered to the patient's satisfaction.     Clayborn Milnes

## 2019-10-19 NOTE — Progress Notes (Signed)
"  Morning" EKG reviewed (delayed to TEE in setting of missed [place] doses)  It shows has converted to NSR s/p DCCV at 65 bpm (AP/VP) with stable QTc at 420-430 ms when corrected for paced QRS.  Continue Tikosyn 250 mcg BID.   Shirley Friar, PA-C  Pager: [phone]  10/19/2019 2:55 PM

## 2019-10-19 NOTE — Progress Notes (Signed)
Pharmacy: Dofetilide (Tikosyn) - Follow Up Assessment and Electrolyte Replacement  Pharmacy consulted to assist in monitoring and replacing electrolytes in this 69 y.o. male admitted on 10/16/2019 undergoing dofetilide re-initiation. First dofetilide dose: 6/19@2200  - was held on 6/21, restarting 6/22@1200   Labs:    Component Value Date/Time   K 4.9 10/19/2019 0452   MG 2.2 10/19/2019 0452     Plan: Potassium: K >/= 4: No additional supplementation needed  Magnesium: Mg > 2: No additional supplementation needed   Thank you for allowing pharmacy to participate in this patient's care   Antonietta Jewel, PharmD, [place] Pharmacist  Phone: [phone] 10/19/2019 11:44 AM  Please check AMION for all [place] phone numbers After 10:00 PM, call [place] [phone]

## 2019-10-19 NOTE — CV Procedure (Signed)
   TRANSESOPHAGEAL ECHOCARDIOGRAM GUIDED DIRECT CURRENT CARDIOVERSION  NAME:  [name]   MRN: [id number] DOB:  1951-04-28   ADMIT DATE: 10/16/2019  INDICATIONS:  Atrial fibrillation  PROCEDURE:   Informed consent was obtained prior to the procedure. The risks, benefits and alternatives for the procedure were discussed and the patient comprehended these risks.  Risks include, but are not limited to, cough, sore throat, vomiting, nausea, somnolence, esophageal and stomach trauma or perforation, bleeding, low blood pressure, aspiration, pneumonia, infection, trauma to the teeth and death.    After a procedural time-out, the oropharynx was anesthetized and the patient was sedated by the anesthesia service. The transesophageal probe was inserted in the esophagus and stomach without difficulty and multiple views were obtained.   FINDINGS:  LEFT VENTRICLE: EF = 60% No WMA  RIGHT VENTRICLE: Moderately HF  LEFT ATRIUM: Moderately to severely dilated. LA diameter 5.1 cm  LEFT ATRIAL APPENDAGE: Surgically amputated. No clot in residual small pouch  RIGHT ATRIUM: Dialted  AORTIC VALVE:  Bioprosthetic valve. Functions normally. No AI/AS  MITRAL VALVE:    Slight restriction of posterior leaflet 3+ posterior MR  TRICUSPID VALVE: Normal severe TR. RVSP ~ 49mmHG  PULMONIC VALVE: Normal Trivial PR  INTERATRIAL SEPTUM: No PFO/ASD  PERICARDIUM: No effusion  DESCENDING AORTA: Moderate plaque   CARDIOVERSION:     Indications:  Atrial Fibrillation  Procedure Details:  Once the TEE was complete, the patient had the defibrillator pads placed in the anterior and posterior position. Once an appropriate level of sedation was achieved, the patient received a single biphasic, synchronized 200J shock with prompt conversion to sinus rhythm. No apparent complications.  Sinus rhythm confirmed by device interrogation   Glori Bickers, MD  11:10 AM

## 2019-10-19 NOTE — Anesthesia Postprocedure Evaluation (Signed)
Anesthesia Post Note  Patient: [name]  Procedure(s) Performed: TRANSESOPHAGEAL ECHOCARDIOGRAM (TEE) (N/A ) CARDIOVERSION (N/A )     Patient location during evaluation: Endoscopy Anesthesia Type: General Level of consciousness: awake and alert Pain management: pain level controlled Vital Signs Assessment: post-procedure vital signs reviewed and stable Respiratory status: spontaneous breathing, nonlabored ventilation and respiratory function stable Cardiovascular status: blood pressure returned to baseline and stable Postop Assessment: no apparent nausea or vomiting Anesthetic complications: no   No complications documented.  Last Vitals:  Vitals:   10/19/19 0920 10/19/19 1120  BP: (!) 169/82 (!) 151/69  Pulse:  62  Resp: 19 (!) 21  Temp: 36.6 C 36.7 C  SpO2:  98%    Last Pain:  Vitals:   10/19/19 1130  TempSrc:   PainSc: 0-No pain                 [name],W. EDMOND

## 2019-10-19 NOTE — Progress Notes (Signed)
PROGRESS NOTE  [name]  DOB: [birth year]  PCP: Velna Hatchet, MD [facility]:[id number]  DOA: 10/16/2019  LOS: 3 days   No chief complaint on file.  Brief narrative: [name] is a 69 y.o. 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 fib on Tikosyn and systemic anticoagulation, IIDM. Patient presented to the ED on 6/19 with increasing short of breath, wheezing, cough and poor oral intake for 3 days.  Denies any fever chills.  Patient came to ED a day earlier for same acute issue and was discharged with p.o. steroid.his symptoms persisted and he was also not able to take his regular medications including Tikosyn.  He returned back to ED on 6/19.    In the ED, patient was tachypneic, hypoxic and required BiPAP. Labs showed creatinine 1.66, troponin slightly elevated to 23, initial lactic acid was elevated to 5.8, WBC count elevated to 17.3. Chest x-ray showed chronic appearing increased lung markings without evidence of acute or active cardiopulmonary disease CT angio of chest did not show any pulmonary embolism, infiltrate, effusion or edema.  It showed marked severity coronary artery calcification and a stable 5 mm noncalcified lung nodule on the right. Patient was admitted to hospitalist service for further evaluation and management.  Subjective: Patient was seen and examined this morning.  Underwent DC cardioversion this morning. Labs from this morning noted, creatinine stable at 1.37.  Assessment/Plan: Acute on chronic respiratory failure with hypoxia Acute exacerbation of advanced COPD Recurrent COPD flareup -Presented with shortness of breath wheezing, cough. -Home meds include Pulmicort, SABA SAMA.  -Not on Mucinex on chronic steroids at home.  Has not seen a pulmonologist in a long time.   -Chest imaging did not show any acute infiltrate. Patient probably has viral bronchitis versus atypical pneumonia. -On admission,  patient was started on short course of p.o. doxycycline and IV steroids.  Continue doxycycline along with Mucinex, bronchodilators, flutter valves. -Continue to taper down steroids -Pulmonary consultation appreciated. -Continue supplemental oxygen.  Lactic acidosis -Initial lactic acid level elevated to 5.5, slowly trending down but still remains elevated, most recent was 3.5 last night.   -Clinically not in sepsis.  Lactic acid elevated probably secondary to anaerobic respiration.  WBC count was elevated likely because of steroids.   -Repeat lactic acid level tomorrow.  Paroxysmal A. Fib -Home meds include Tikosyn 250 mg twice daily and metoprolol 50 mg twice daily along with Eliquis 5 mg daily.   -All medications have been resumed.  Since Tikosyn has been reinitiated after a pause, patient needs to be monitored for few days.   -Cardiology consultation appreciated.  QTC being monitored.. -Patient underwent successful DC cardioversion this morning.  Essential hypertension -Home meds include amlodipine 10 mg daily, metoprolol 50 mg twice daily, torsemide 40 mg every other day Aldactone 25 mg daily. -Amlodipin and Aldactone resumed. Metoprolol was switched to bisoprolol.  Torsemide remains on hold.  Heart failure team following. -Continue to monitor potassium magnesium.  AKI on CKD IIIb -Creatinine at baseline seems to be between 1.3-1.5. -Presented with creatinine of 1.66.  Slowly improving with hydration. -Creatinine this morning is 1.34.  Continue to monitor.  Hypothyroidism -Continue Synthroid  Other home meds also include include Neurontin 600 mg, 600 mg and 1200 mg, Norco as needed twice daily, Protonix 40 mg daily, Requip 2 mg at bedtime and Trazodone 12.5 mg at bedtime. Continue all.  Mobility: Ambulates independently at home.  Encourage ambulation Code Status:  Code Status: Full Code  DVT prophylaxis:   apixaban (ELIQUIS) tablet 5 mg  Antimicrobials:  Doxycycline  oral Fluid: Not on IV fluid.  Diet:  Diet Order            Diet Heart Room service appropriate? Yes; Fluid consistency: Thin  Diet effective now                 Consultants: Cardiology, pulmonology Family Communication:  Spoke with patient's wife at bedside. Status is: Inpatient  Remains inpatient appropriate because: patient has been reinitiated on Tikosyn and needs to stay in the hospital for few days for monitoring QTC  Dispo: The patient is from: Home              Anticipated d/c is to: Home              Anticipated d/c date is: 3 days              Patient currently is not medically stable to d/c.   Infusions:    Scheduled Meds: . amLODipine  5 mg Oral Daily  . apixaban  5 mg Oral BID  . bisoprolol  5 mg Oral Daily  . cholecalciferol  8,000 Units Oral QHS  . dofetilide  250 mcg Oral BID  . doxycycline  100 mg Oral Q12H  . feeding supplement (ENSURE ENLIVE)  237 mL Oral BID BM  . gabapentin  1,200 mg Oral QHS  . gabapentin  600 mg Oral BID WC  . insulin aspart  0-5 Units Subcutaneous QHS  . insulin aspart  0-9 Units Subcutaneous TID WC  . levothyroxine  175 mcg Oral QAC breakfast  . loratadine  10 mg Oral Daily  . methylPREDNISolone (SOLU-MEDROL) injection  80 mg Intravenous Q12H  . mometasone-formoterol  2 puff Inhalation BID  . pantoprazole  40 mg Oral BID AC  . rOPINIRole  2 mg Oral QHS  . rosuvastatin  10 mg Oral QHS  . spironolactone  25 mg Oral Daily  . traMADol  50 mg Oral BID  . traZODone  25 mg Oral QHS    Antimicrobials: Anti-infectives (From admission, onward)   Start     Dose/Rate Route Frequency Ordered Stop   10/17/19 1000  azithromycin (ZITHROMAX) tablet 250 mg  Status:  Discontinued       "Followed by" Linked Group Details   250 mg Oral Daily 10/16/19 2003 10/16/19 2014   10/17/19 0330  vancomycin (VANCOREADY) IVPB 750 mg/150 mL  Status:  Discontinued        750 mg 100 mL/hr over 90 Minutes Intravenous Every 12 hours 10/16/19 1416 10/16/19  2002   10/16/19 2200  doxycycline (VIBRA-TABS) tablet 100 mg     Discontinue     100 mg Oral Every 12 hours 10/16/19 2014     10/16/19 2015  azithromycin (ZITHROMAX) tablet 500 mg  Status:  Discontinued       "Followed by" Linked Group Details   500 mg Oral Daily 10/16/19 2003 10/16/19 2014   10/16/19 1415  ceFEPIme (MAXIPIME) 2 g in sodium chloride 0.9 % 100 mL IVPB  Status:  Discontinued        2 g 200 mL/hr over 30 Minutes Intravenous  Once 10/16/19 1401 10/16/19 1414   10/16/19 1415  metroNIDAZOLE (FLAGYL) IVPB 500 mg        500 mg 100 mL/hr over 60 Minutes Intravenous  Once 10/16/19 1401 10/16/19 1531   10/16/19 1415  vancomycin (VANCOCIN) IVPB 1000  mg/200 mL premix  Status:  Discontinued        1,000 mg 200 mL/hr over 60 Minutes Intravenous  Once 10/16/19 1401 10/16/19 1412   10/16/19 1415  vancomycin (VANCOREADY) IVPB 1750 mg/350 mL        1,750 mg 116.7 mL/hr over 180 Minutes Intravenous  Once 10/16/19 1412 10/16/19 1856   10/16/19 1415  ceFEPIme (MAXIPIME) 2 g in sodium chloride 0.9 % 100 mL IVPB  Status:  Discontinued        2 g 200 mL/hr over 30 Minutes Intravenous Every 12 hours 10/16/19 1414 10/16/19 2002      PRN meds: HYDROcodone-acetaminophen, ipratropium-albuterol, ondansetron, polyethylene glycol powder   Objective: Vitals:   10/19/19 1120 10/19/19 1233  BP: (!) 151/69 (!) 152/82  Pulse: 62 63  Resp: (!) 21 17  Temp: 98 F (36.7 C) 97.7 F (36.5 C)  SpO2: 98% 100%    Intake/Output Summary (Last 24 hours) at 10/19/2019 1506 Last data filed at 10/19/2019 0900 Gross per 24 hour  Intake --  Output 1350 ml  Net -1350 ml   Filed Weights   10/17/19 1236 10/18/19 0459 10/19/19 0446  Weight: 86.6 kg 87.5 kg 87.1 kg   Weight change: 0.536 kg Body mass index is 26.79 kg/m.   Physical Exam: General exam: Appears calm and comfortable.  Lying on bed.  Not in physical distress.  Skin: No rashes, lesions or ulcers. HEENT: Atraumatic, normocephalic, supple neck,  no obvious bleeding Lungs: Clear to auscultation bilaterally.  No crackles or wheezing. CVS: Regular rate and rhythm, no murmur GI/Abd soft, nontender, nondistended, bowel sound present CNS: Alert, awake, oriented x3 Psychiatry: Mood appropriate Extremities: No pedal edema, no calf tenderness  Data Review: I have personally reviewed the laboratory data and studies available.  Recent Labs  Lab 10/15/19 0305 10/16/19 1312 10/16/19 1341 10/16/19 1342 10/17/19 0653  WBC 10.0 17.3*  --   --  10.3  NEUTROABS 8.7* 15.0*  --   --   --   HGB 13.2 14.5 14.6 14.3 11.9*  HCT 40.8 44.7 43.0 42.0 38.7*  MCV 92.1 92.5  --   --  96.0  PLT 131* 226  --   --  147*   Recent Labs  Lab 10/16/19 1312 10/16/19 1312 10/16/19 1341 10/16/19 1341 10/16/19 1342 10/16/19 2250 10/17/19 0653 10/18/19 0444 10/19/19 0452  NA 138   < > 139   < > 139 139 138 136 133*  K 4.0   < > 3.8   < > 3.7 4.2 5.2* 4.8 4.9  CL 103   < > 104  --   --  106 107 105 99  CO2 17*  --   --   --   --  20* 22 23 25   GLUCOSE 142*   < > 155*  --   --  182* 156* 162* 151*  BUN 28*   < > 29*  --   --  28* 27* 27* 30*  CREATININE 1.66*   < > 1.40*  --   --  1.54* 1.30* 1.34* 1.37*  CALCIUM 9.9  --   --   --   --  8.4* 8.6* 8.6* 9.0  MG  --   --   --   --   --  2.8* 2.6* 2.2 2.2   < > = values in this interval not displayed.    Signed, Terrilee Croak, MD [facility] Pager: [phone] (Secure Chat preferred). 10/19/2019

## 2019-10-19 NOTE — Anesthesia Procedure Notes (Signed)
Procedure Name: MAC Date/Time: 10/19/2019 10:26 AM Performed by: Babs Bertin, CRNA Pre-anesthesia Checklist: Patient identified, Emergency Drugs available, Suction available, Patient being monitored and Timeout performed Patient Re-evaluated:Patient Re-evaluated prior to induction Oxygen Delivery Method: Nasal cannula

## 2019-10-20 LAB — BASIC METABOLIC PANEL
Anion gap: 10 (ref 5–15)
BUN: 34 mg/dL — ABNORMAL HIGH (ref 8–23)
CO2: 24 mmol/L (ref 22–32)
Calcium: 9 mg/dL (ref 8.9–10.3)
Chloride: 99 mmol/L (ref 98–111)
Creatinine, Ser: 1.25 mg/dL — ABNORMAL HIGH (ref 0.61–1.24)
GFR calc Af Amer: >60 mL/min (ref >60)
GFR calc non Af Amer: 58 mL/min — ABNORMAL LOW (ref >60)
Glucose, Bld: 177 mg/dL — ABNORMAL HIGH (ref 70–99)
Potassium: 4.8 mmol/L (ref 3.5–5.1)
Sodium: 133 mmol/L — ABNORMAL LOW (ref 135–145)

## 2019-10-20 LAB — LACTIC ACID, PLASMA: Lactic Acid, Venous: 2.4 mmol/L (ref 0.5–1.9)

## 2019-10-20 LAB — MAGNESIUM: Magnesium: 2.2 mg/dL (ref 1.7–2.4)

## 2019-10-20 LAB — GLUCOSE, CAPILLARY
Glucose-Capillary: 137 mg/dL — ABNORMAL HIGH (ref 70–99)
Glucose-Capillary: 160 mg/dL — ABNORMAL HIGH (ref 70–99)

## 2019-10-20 MED ORDER — MOMETASONE FURO-FORMOTEROL FUM 100-5 MCG/ACT IN AERO: 2.0000 | INHALATION_SPRAY | Freq: Two times a day (BID) | RESPIRATORY_TRACT | 0 refills | Status: DC

## 2019-10-20 MED ORDER — BISOPROLOL FUMARATE 5 MG PO TABS: 5.0000 mg | ORAL_TABLET | Freq: Every day | ORAL | 0 refills | Status: DC

## 2019-10-20 MED ORDER — PREDNISONE 10 MG PO TABS
ORAL_TABLET | ORAL | 0 refills | Status: DC
Start: 2019-10-20 — End: 2019-11-05

## 2019-10-20 MED ORDER — DOFETILIDE 250 MCG PO CAPS
250.0000 ug | ORAL_CAPSULE | Freq: Two times a day (BID) | ORAL | Status: DC
  Administered 2019-10-20: 250 ug via ORAL
  Filled 2019-10-20: qty 1

## 2019-10-20 MED FILL — BISOPROLOL FUMARATE 5 MG TA: 5 | 30 days supply | Qty: 30 | Fill #0

## 2019-10-20 MED FILL — DULERA 100 MCG/5 MCG INH: 100-5 | 30 days supply | Qty: 13 | Fill #0

## 2019-10-20 NOTE — Discharge Summary (Addendum)
Physician Discharge Summary  [name] SAY:[id number] DOB: [birth year] DOA: 10/16/2019  PCP: Velna Hatchet, MD  Admit date: 10/16/2019 Discharge date: 10/20/2019  Admitted From: Home Discharge disposition: Home   Code Status: Full Code  Diet Recommendation: cardiac   Recommendations for Outpatient Follow-Up:   1. F/u cardiology and pulmonology as an outpatient.  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 fib on Tikosyn and systemic anticoagulation, IIDM. Patient presented to the ED on 6/19with increasing short of breath,wheezing, cough and poor oral intake for 3 days. Denies any fever chills.  Patient came to ED a day earlier for same acute issue and was discharged with p.o. steroid.his symptoms persisted and he was also not able to take his regular medications including Tikosyn.  He returned back to ED on 6/19.    In the ED, patient was tachypneic, hypoxic and required BiPAP. Labs showed creatinine 1.66, troponin slightly elevated to 23, initial lactic acid was elevated to 5.8, WBC count elevated to 17.3. Chest x-ray showed chronic appearing increased lung markings without evidence of acute or active cardiopulmonary disease CT angio of chest did not show any pulmonary embolism, infiltrate, effusion or edema.  It showed marked severity coronary artery calcification and a stable 5 mm noncalcified lung nodule on the right. Patient was admitted to hospitalist service for further evaluation and management.  Hospital Course:  Acute on chronic respiratory failure with hypoxia Acute exacerbation of advanced COPD Recurrent COPD flareup -Presented with shortness of breath wheezing, cough. -Home meds  include Pulmicort, SABA SAMA.  -Not on Mucinex on chronic steroids at home.  Has not seen a pulmonologist in a long time.   -Chest imaging did not show any acute infiltrate. Patient probably has viral bronchitis versus atypical pneumonia. -On admission, patient was started on short course of p.o. doxycycline for 5 days, course completed today on 6-23.  -He was also started on IV steroids, gradually being tapered off.  At discharge today, I will switch him to oral prednisone to gradually taper down.  I would also recommend him to be on chronic prednisone at 5 mg daily. -Continue Mucinex, bronchodilators. -Patient also has an appointment with pulmonologist as an outpatient. -Continue supplemental oxygen at home as before.  Lactic acidosis -Initial lactic acid level elevated to 5.5, slowly trending down but still remains elevated, most recent was 3.5 last night.   -Clinically not in sepsis.  Lactic acid elevated probably secondary to anaerobic respiration.  WBC count was elevated likely because of steroids.   -Repeat lactic acid level this morning still remains elevated at 2.4.  I do not think it is related to sepsis.  It is gradually trending down.  Likely secondary to anaerobic respiration.  Paroxysmal A. Fib -Home meds include Tikosyn 250 mg twice daily and metoprolol 50 mg twice daily along with Eliquis 5 mg daily.   -All medications have been resumed.  Since Tikosyn was reinitiated after a pause, patient was monitored for few days.   -Cardiology consultation appreciated.  QTC being monitored.. -Patient underwent successful DC cardioversion on 6/22.  Essential hypertension -Home meds include amlodipine 10 mg daily, metoprolol 50 mg twice daily, torsemide 40 mg every other day Aldactone 25 mg daily. -Amlodipin and Aldactone were  resumed. Metoprolol was switched to bisoprolol by pulmonologist Dr. Melvyn Novas.   Torsemide to resume post discharge. -Continue potassium and magnesium  supplementation.  Elevated BNP -BNP (last 3 results) Recent Labs    09/09/19 1953 10/15/19 0305 10/16/19 1312  BNP 238.3* 228.2* 243.1*   Echocardiogram from 6/22 with 60 to 65% EF Unable to call it a congestive heart rate.  AKI on CKD IIIb -Creatinine at baseline seems to be between 1.3-1.5. -Presented with creatinine of 1.66.  Slowly improving with hydration. -Creatinine this morning is 1.25.   Hypothyroidism -Continue Synthroid  Other home meds also include include Neurontin 600 mg, 600 mg and 1200 mg, Norco as needed twice daily, Protonix 40 mg daily, Requip 2 mg at bedtime and Trazodone 12.5 mg at bedtime. Continue all.  Mobility: Ambulates independently at home.  Encourage ambulation Code Status:  Code Status: Full Code   Stable for discharge to home today.  Subjective:  Seen and examined this afternoon.  Pleasant, not in distress, no new symptoms.    Discharge Exam:   Vitals:   10/20/19 0739 10/20/19 0756 10/20/19 0900 10/20/19 1123  BP:  (!) 154/80  (!) 149/75  Pulse: 67 61 65 60  Resp: 16 18  18   Temp:  97.9 F (36.6 C)  97.7 F (36.5 C)  TempSrc:  Oral  Oral  SpO2: 99% 99% 97% 96%  Weight:      Height:        Body mass index is 26.67 kg/m.  General exam: Appears calm and comfortable.  Skin: No rashes, lesions or ulcers. HEENT: Atraumatic, normocephalic, supple neck, no obvious bleeding Lungs: Clear to auscultation bilaterally CVS: Regular rate and rhythm, no murmur GI/Abd soft, nontender, nondistended, bowel sound present CNS: Alert, awake, oriented x3 Psychiatry: Mood appropriate Extremities: No pedal edema, no calf tenderness  Discharge Instructions:  Wound care: None     Discharge Instructions    Diet - low sodium heart healthy   Complete by: As directed    Increase activity slowly   Complete by: As directed       Follow-up Information    Martyn Ehrich, NP Follow up on 11/05/2019.   Specialty: Pulmonary Disease Why:  Your appointment is at 3:15PM Contact information: [place] [address] [phone]        [place] Follow up on 10/28/2019.   Specialty: Cardiology Why: at 1030 for post hospital tikosyn follow up. July parking code is 31. (June code is 5007, just in case) Contact information: [phone]       Bensimhon, Shaune Pascal, MD Follow up on 11/18/2019.   Specialty: Cardiology Why: at 1140 for follow up. Parking code for july is Special educational needs teacher information: [address] [place] [address] [phone]        Shirley Friar, PA-C Follow up.   Specialty: Physician Assistant Why: We will call you for an appointment, to be scheduled for 2-3 months from discharge Contact information: [place] [address] [phone]        Velna Hatchet, MD Follow up.   Specialty: Internal Medicine Contact information: [place] [address] [phone]        Thompson Grayer, MD .   Specialty: Cardiology Contact information: [place] [address] [phone]  Allergies as of 10/20/2019      Reactions   Meloxicam Rash   Vancomycin Other (See Comments)   Red Man's syndrome 09/02/15, resolved with diphenhydramine and slowing of rate      Medication List    STOP taking these medications   metoprolol tartrate 50 MG tablet Commonly known as: LOPRESSOR     TAKE these medications   albuterol 108 (90 Base) MCG/ACT inhaler Commonly known as: VENTOLIN HFA Inhale 2 puffs into the lungs every 6 (six) hours as needed for wheezing or shortness of breath.   amLODipine 10 MG tablet Commonly known as: NORVASC Take 0.5 tablets (5 mg total) by mouth daily.   apixaban 5 MG Tabs tablet Commonly known as: Eliquis TAKE 1 TABLET(5 MG) BY MOUTH TWICE  DAILY What changed:   how much to take  how to take this  when to take this  additional instructions   bisoprolol 5 MG tablet Commonly known as: ZEBETA Take 1 tablet (5 mg total) by mouth daily. Start taking on: October 21, 2019   budesonide-formoterol 160-4.5 MCG/ACT inhaler Commonly known as: SYMBICORT Inhale 2 puffs into the lungs 2 (two) times daily.   dofetilide 250 MCG capsule Commonly known as: TIKOSYN Take 1 capsule (250 mcg total) by mouth 2 (two) times daily.   fexofenadine 180 MG tablet Commonly known as: ALLEGRA Take 1 tablet (180 mg total) by mouth daily. What changed:   when to take this  reasons to take this   gabapentin 300 MG capsule Commonly known as: NEURONTIN Take 600-1,200 mg by mouth See admin instructions. Take 2 capsules ( 600 mg) by mouth in the morning and  at lunchtime, and take 4 capsules (1,200 mg) at bedtime   HYDROcodone-acetaminophen 5-325 MG tablet Commonly known as: NORCO/VICODIN Take 1 tablet by mouth 2 (two) times daily as needed (pain).   ipratropium 17 MCG/ACT inhaler Commonly known as: ATROVENT HFA Inhale 2 puffs into the lungs 2 (two) times daily.   levothyroxine 175 MCG tablet Commonly known as: SYNTHROID Take 175 mcg by mouth daily before breakfast.   MAGNESIUM PO Take 1 tablet by mouth at bedtime.   mometasone-formoterol 100-5 MCG/ACT Aero Commonly known as: DULERA Inhale 2 puffs into the lungs 2 (two) times daily.   ondansetron 4 MG disintegrating tablet Commonly known as: Zofran ODT Take 1 tablet (4 mg total) by mouth every 8 (eight) hours as needed for nausea. What changed: when to take this   OXYGEN Inhale 2 L into the lungs continuous.   pantoprazole 40 MG tablet Commonly known as: PROTONIX Take 40 mg by mouth daily.   polyethylene glycol powder 17 GM/SCOOP powder Commonly known as: GLYCOLAX/MIRALAX Take 17 g by mouth daily. What changed:   when to take this  reasons to take this   potassium  chloride SA 20 MEQ tablet Commonly known as: KLOR-CON Take 1 tablet (20 mEq total) by mouth 2 (two) times daily. Needs appt for further refills What changed:   when to take this  additional instructions   predniSONE 10 MG tablet Commonly known as: DELTASONE Take 4 tablets (40 mg) daily for 2 days, then, Take 3 tablets (30 mg) daily for 2 days, then, Take 2 tablets (20 mg) daily for 2 days, then, Take 1 tablets (10 mg) daily for 1 days, then 5 mg daily for long term. What changed:   how much to take  how to take this  when to take this  additional instructions   rOPINIRole  1 MG tablet Commonly known as: REQUIP Take 1 tablet (1 mg total) by mouth daily. What changed:   how much to take  when to take this   rosuvastatin 10 MG tablet Commonly known as: CRESTOR Take 10 mg by mouth at bedtime.   spironolactone 25 MG tablet Commonly known as: ALDACTONE Take 1 tablet (25 mg total) by mouth daily.   torsemide 20 MG tablet Commonly known as: DEMADEX TAKE 2 TABLETS BY MOUTH  DAILY What changed: when to take this   traMADol-acetaminophen 37.5-325 MG tablet Commonly known as: ULTRACET Take 2 tablets by mouth 2 (two) times daily.   traZODone 50 MG tablet Commonly known as: DESYREL Take 12.5 mg by mouth at bedtime.   Vitamin D 50 MCG (2000 UT) Caps Take 8,000 Units by mouth at bedtime.       Time coordinating discharge: 35 minutes  The results of significant diagnostics from this hospitalization (including imaging, microbiology, ancillary and laboratory) are listed below for reference.    Procedures and Diagnostic Studies:   CT Angio Chest PE W and/or Wo Contrast  Result Date: 10/16/2019 CLINICAL DATA:  Cough and shortness of breath with vomiting. EXAM: CT ANGIOGRAPHY CHEST CT ABDOMEN AND PELVIS WITH CONTRAST TECHNIQUE: Multidetector CT imaging of the chest was performed using the standard protocol during bolus administration of intravenous contrast. Multiplanar CT  image reconstructions and MIPs were obtained to evaluate the vascular anatomy. Multidetector CT imaging of the abdomen and pelvis was performed using the standard protocol during bolus administration of intravenous contrast. CONTRAST:  23mL OMNIPAQUE IOHEXOL 350 MG/ML SOLN COMPARISON:  Sep 09, 2019 FINDINGS: CTA CHEST FINDINGS Cardiovascular: There is moderate severity calcification of the aortic arch. Satisfactory opacification of the pulmonary arteries to the segmental level. No evidence of pulmonary embolism. Normal heart size. No pericardial effusion. Marked severity coronary artery calcification is seen. Mediastinum/Nodes: No enlarged mediastinal, hilar, or axillary lymph nodes. The trachea and esophagus demonstrate no significant findings. Lungs/Pleura: Very mild atelectasis is seen along the medial aspect of the right upper lobe. A stable 6 mm calcified lung nodule is seen within the right upper lobe. A stable 5 mm noncalcified lung nodule is seen along the lateral aspect of the major fissure on the right (axial CT image 81, CT series number 6). There is no evidence of a pleural effusion or pneumothorax. Musculoskeletal: Multilevel degenerative changes seen throughout the thoracic spine. Review of the MIP images confirms the above findings. CT ABDOMEN and PELVIS FINDINGS Hepatobiliary: No focal liver abnormality is seen. No gallstones, gallbladder wall thickening, or biliary dilatation. Pancreas: Unremarkable. No pancreatic ductal dilatation or surrounding inflammatory changes. Spleen: The spleen is mildly enlarged. Adrenals/Urinary Tract: Adrenal glands are unremarkable. There is marked severity cortical thinning seen along the anterior aspect of the mid and upper right kidney. The left kidney is normal in size. There is no evidence of renal calculi or hydronephrosis. A 1.5 cm cystic appearing area is seen within the posterior aspect of the mid right kidney, with an additional subcentimeter cyst seen within  the lower pole of the left kidney. Bladder is unremarkable. Stomach/Bowel: Stomach is within normal limits. The appendix is not clearly identified. No evidence of bowel wall thickening, distention, or inflammatory changes. Vascular/Lymphatic: There is moderate severity calcification of the abdominal aorta. No enlarged abdominal or pelvic lymph nodes. Reproductive: The prostate gland is mildly enlarged. Other: There is a 3.2 cm x 1.5 cm fat containing umbilical hernia. A 4.0 cm x 2.6 cm fat containing  left inguinal hernia is also seen. Musculoskeletal: Degenerative changes are noted throughout the lumbar spine. Review of the MIP images confirms the above findings. IMPRESSION: 1. No evidence of pulmonary embolus. 2. Marked severity coronary artery calcification. 3. Stable 5 mm noncalcified lung nodule along the lateral aspect of the major fissure on the right. 4. Marked severity cortical thinning along the anterior aspect of the mid and upper right kidney. 5. Fat containing umbilical and left inguinal hernias. 6. Mildly enlarged prostate gland. Aortic Atherosclerosis (ICD10-I70.0). Electronically Signed   By: Virgina Norfolk M.D.   On: 10/16/2019 16:46   CT ABDOMEN PELVIS W CONTRAST  Result Date: 10/16/2019 CLINICAL DATA:  Shortness of breath with abdominal pain and neutropenia. EXAM: CT ANGIOGRAPHY CHEST CT ABDOMEN AND PELVIS WITH CONTRAST TECHNIQUE: Multidetector CT imaging of the chest was performed using the standard protocol during bolus administration of intravenous contrast. Multiplanar CT image reconstructions and MIPs were obtained to evaluate the vascular anatomy. Multidetector CT imaging of the abdomen and pelvis was performed using the standard protocol during bolus administration of intravenous contrast. CONTRAST:  48mL OMNIPAQUE IOHEXOL 350 MG/ML SOLN COMPARISON:  None. FINDINGS: CTA CHEST FINDINGS Cardiovascular: There is moderate severity calcification of the aortic arch. Satisfactory  opacification of the pulmonary arteries to the segmental level. No evidence of pulmonary embolism. Normal heart size. No pericardial effusion. Marked severity coronary artery calcification is seen. Mediastinum/Nodes: No enlarged mediastinal, hilar, or axillary lymph nodes. The trachea and esophagus demonstrate no significant findings. Lungs/Pleura: Very mild atelectasis is seen along the medial aspect of the right upper lobe. A stable 6 mm calcified lung nodule is seen within the right upper lobe. A stable 5 mm noncalcified lung nodule is seen along the lateral aspect of the major fissure on the right (axial CT image 81, CT series number 6). There is no evidence of a pleural effusion or pneumothorax. Musculoskeletal: Multilevel degenerative changes seen throughout the thoracic spine. Review of the MIP images confirms the above findings. CT ABDOMEN and PELVIS FINDINGS Hepatobiliary: No focal liver abnormality is seen. No gallstones, gallbladder wall thickening, or biliary dilatation. Pancreas: Unremarkable. No pancreatic ductal dilatation or surrounding inflammatory changes. Spleen: The spleen is mildly enlarged. Adrenals/Urinary Tract: Adrenal glands are unremarkable. There is marked severity cortical thinning seen along the anterior aspect of the mid and upper right kidney. The left kidney is normal in size. There is no evidence of renal calculi or hydronephrosis. A 1.5 cm cystic appearing area is seen within the posterior aspect of the mid right kidney, with an additional subcentimeter cyst seen within the lower pole of the left kidney. Bladder is unremarkable. Stomach/Bowel: Stomach is within normal limits. The appendix is not clearly identified. No evidence of bowel wall thickening, distention, or inflammatory changes. Vascular/Lymphatic: There is moderate severity calcification of the abdominal aorta. No enlarged abdominal or pelvic lymph nodes. Reproductive: The prostate gland is mildly enlarged. Other: There  is a 3.2 cm x 1.5 cm fat containing umbilical hernia. A 4.0 cm x 2.6 cm fat containing left inguinal hernia is also seen. Musculoskeletal: Degenerative changes are noted throughout the lumbar spine. Review of the MIP images confirms the above findings. IMPRESSION: 1. No evidence of pulmonary embolus. 2. Marked severity coronary artery calcification. 3. Stable 5 mm noncalcified lung nodule along the lateral aspect of the major fissure on the right. 4. Marked severity cortical thinning along the anterior aspect of the mid and upper right kidney. 5. Fat containing umbilical and left inguinal hernias. 6. Mildly  enlarged prostate gland. Aortic Atherosclerosis (ICD10-I70.0). Electronically Signed   By: Virgina Norfolk M.D.   On: 10/16/2019 16:47   DG Chest Port 1 View  Result Date: 10/16/2019 CLINICAL DATA:  Of breath x3 days. EXAM: PORTABLE CHEST 1 VIEW COMPARISON:  October 15, 2019 FINDINGS: There is a dual lead AICD. Multiple sternal wires are noted. Mild, diffuse chronic appearing increased lung markings are seen. There is no evidence of acute infiltrate, pleural effusion or pneumothorax. The cardiac silhouette is borderline in size. An artificial cardiac valve is seen. There is mild calcification of the aortic arch. Multilevel degenerative changes seen throughout the thoracic spine. IMPRESSION: Chronic appearing increased lung markings without evidence of acute or active cardiopulmonary disease. Electronically Signed   By: Virgina Norfolk M.D.   On: 10/16/2019 15:04   CT MAXILLOFACIAL WO CONTRAST  Result Date: 10/17/2019 CLINICAL DATA:  Cough. EXAM: CT MAXILLOFACIAL WITHOUT CONTRAST TECHNIQUE: Multidetector CT imaging of the maxillofacial structures was performed. Multiplanar CT image reconstructions were also generated. COMPARISON:  Head CT 09/03/2015 FINDINGS: The study is mildly motion degraded. Osseous: No fracture or destructive osseous process. Orbits: Unremarkable. Sinuses: 1.2 cm polyp or mucous  retention cyst in the right maxillary sinus with the other sinuses being clear. No fluid. Patent ostiomeatal units. Paradoxical rotation of the right middle turbinate. 6 mm leftward deviation of an intact nasal septum. Clear mastoid air cells and tympanic cavities. Soft tissues: Unremarkable. Limited intracranial: Unremarkable. IMPRESSION: 1. Small right maxillary sinus polyp or mucous retention cyst. Otherwise clear sinuses. 2. Leftward nasal septal deviation. Electronically Signed   By: Logan Bores M.D.   On: 10/17/2019 18:07     Labs:   Basic Metabolic Panel: Recent Labs  Lab 10/16/19 2250 10/16/19 2250 10/17/19 0653 10/17/19 0653 10/18/19 0444 10/18/19 0444 10/19/19 0452 10/20/19 0316  NA 139  --  138  --  136  --  133* 133*  K 4.2   < > 5.2*   < > 4.8   < > 4.9 4.8  CL 106  --  107  --  105  --  99 99  CO2 20*  --  22  --  23  --  25 24  GLUCOSE 182*  --  156*  --  162*  --  151* 177*  BUN 28*  --  27*  --  27*  --  30* 34*  CREATININE 1.54*  --  1.30*  --  1.34*  --  1.37* 1.25*  CALCIUM 8.4*  --  8.6*  --  8.6*  --  9.0 9.0  MG 2.8*  --  2.6*  --  2.2  --  2.2 2.2   < > = values in this interval not displayed.   GFR Estimated Creatinine Clearance: 58.6 mL/min (A) (by C-G formula based on SCr of 1.25 mg/dL (H)). Liver Function Tests: Recent Labs  Lab 10/15/19 0305 10/16/19 1312  AST 21 26  ALT 22 21  ALKPHOS 78 68  BILITOT 1.2 1.2  PROT 7.4 7.4  ALBUMIN 4.2 4.2   Recent Labs  Lab 10/15/19 0305 10/16/19 1312  LIPASE 22 80*   No results for input(s): AMMONIA in the last 168 hours. Coagulation profile Recent Labs  Lab 10/16/19 1312 10/18/19 1810  INR 1.3* 1.6*    CBC: Recent Labs  Lab 10/15/19 0305 10/16/19 1312 10/16/19 1341 10/16/19 1342 10/17/19 0653  WBC 10.0 17.3*  --   --  10.3  NEUTROABS 8.7* 15.0*  --   --   --  HGB 13.2 14.5 14.6 14.3 11.9*  HCT 40.8 44.7 43.0 42.0 38.7*  MCV 92.1 92.5  --   --  96.0  PLT 131* 226  --   --  147*    Cardiac Enzymes: No results for input(s): CKTOTAL, CKMB, CKMBINDEX, TROPONINI in the last 168 hours. BNP: Invalid input(s): POCBNP CBG: Recent Labs  Lab 10/19/19 1629 10/19/19 2115 10/19/19 2300 10/20/19 0742 10/20/19 1140  GLUCAP 213* 172* 168* 137* 160*   D-Dimer No results for input(s): DDIMER in the last 72 hours. Hgb A1c No results for input(s): HGBA1C in the last 72 hours. Lipid Profile No results for input(s): CHOL, HDL, LDLCALC, TRIG, CHOLHDL, LDLDIRECT in the last 72 hours. Thyroid function studies No results for input(s): TSH, T4TOTAL, T3FREE, THYROIDAB in the last 72 hours.  Invalid input(s): FREET3 Anemia work up No results for input(s): VITAMINB12, FOLATE, FERRITIN, TIBC, IRON, RETICCTPCT in the last 72 hours. Microbiology Recent Results (from the past 240 hour(s))  Blood Culture (routine x 2)     Status: None (Preliminary result)   Collection Time: 10/16/19 12:58 PM   Specimen: BLOOD RIGHT FOREARM  Result Value Ref Range Status   Specimen Description BLOOD RIGHT FOREARM  Final   Special Requests   Final    BOTTLES DRAWN AEROBIC AND ANAEROBIC Blood Culture adequate volume   Culture   Final    NO GROWTH 4 DAYS Performed at [facility], [address], [place], [place] [address]    Report Status PENDING  Incomplete  Blood Culture (routine x 2)     Status: None (Preliminary result)   Collection Time: 10/16/19  1:20 PM   Specimen: BLOOD LEFT ARM  Result Value Ref Range Status   Specimen Description BLOOD LEFT ARM  Final   Special Requests   Final    BOTTLES DRAWN AEROBIC AND ANAEROBIC Blood Culture adequate volume   Culture   Final    NO GROWTH 4 DAYS Performed at [facility], [place] [address] [place], [place] [address]    Report Status PENDING  Incomplete  SARS Coronavirus 2 by RT PCR (hospital order, performed in [facility] hospital lab) Nasopharyngeal Nasopharyngeal Swab     Status: None   Collection Time: 10/16/19  5:15 PM    Specimen: Nasopharyngeal Swab  Result Value Ref Range Status   SARS Coronavirus 2 NEGATIVE NEGATIVE Final    Comment: (NOTE) SARS-CoV-2 target nucleic acids are NOT DETECTED.  The SARS-CoV-2 RNA is generally detectable in upper and lower respiratory specimens during the acute phase of infection. The lowest concentration of SARS-CoV-2 viral copies this assay can detect is 250 copies / mL. A negative result does not preclude SARS-CoV-2 infection and should not be used as the sole basis for treatment or other patient management decisions.  A negative result may occur with improper specimen collection / handling, submission of specimen other than nasopharyngeal swab, presence of viral mutation(s) within the areas targeted by this assay, and inadequate number of viral copies (<250 copies / mL). A negative result must be combined with clinical observations, patient history, and epidemiological information.  Fact Sheet for Patients:   StrictlyIdeas.no  Fact Sheet for Healthcare Providers: BankingDealers.co.za  This test is not yet approved or  cleared by the [place] FDA and has been authorized for detection and/or diagnosis of SARS-CoV-2 by FDA under an Emergency Use Authorization (EUA).  This EUA will remain in effect (meaning this test can be used) for the duration of  the COVID-19 declaration under Section 564(b)(1) of the Act, 21 U.S.C. section 360bbb-3(b)(1), unless the authorization is terminated or revoked sooner.  Performed at [facility], [place] [address] [place], [place] [address]   Urine culture     Status: None   Collection Time: 10/16/19  6:50 PM   Specimen: In/Out Cath Urine  Result Value Ref Range Status   Specimen Description IN/OUT CATH URINE  Final   Special Requests NONE  Final   Culture   Final    NO GROWTH Performed at [facility], [place] [address] [place], [place] [address]    Report Status  10/17/2019 FINAL  Final    Please note: You were cared for by a hospitalist during your hospital stay. Once you are discharged, your primary care physician will handle any further medical issues. Please note that NO REFILLS for any discharge medications will be authorized once you are discharged, as it is imperative that you return to your primary care physician (or establish a relationship with a primary care physician if you do not have one) for your post hospital discharge needs so that they can reassess your need for medications and monitor your lab values.  Signed: Terrilee Croak  [facility] 10/20/2019, 4:16 PM

## 2019-10-20 NOTE — Progress Notes (Signed)
Morning EKG reviewed  Shows remains in NSR (A paced V Paced) at 60 bpm with stable QTc at ~450 ms when corrected for paced QRS  Continue Tikosyn 250 mcg BID.   Pt remains stable on his chronic tikosyn dose. No further tikosyn monitoring requiring.    AF clinic and EP follow up has been made and placed in chart. EP to see as needed while here. Pt OK for discharge from cardiac perspective.    Shirley Friar, PA-C  Pager: [phone]  10/20/2019 11:51 AM

## 2019-10-20 NOTE — Progress Notes (Signed)
Chaplain follow-up visit to determine if AD for patient and his wife were complete.  Chaplain attempted to procure volunteers as witness but none were available.  Patient and wife very understanding and felt comfortable going to their bank for notary.  They will bring ADs with them to next cardiac visit and present them for inclusion in their hospital records.  De Burrs Chaplain Resident

## 2019-10-20 NOTE — Progress Notes (Signed)
Pharmacy: Dofetilide (Tikosyn) - Follow Up Assessment and Electrolyte Replacement  Pharmacy consulted to assist in monitoring and replacing electrolytes in this 69 y.o. male admitted on 10/16/2019 undergoing dofetilide re-initiation. First dofetilide dose: 6/19@2200  - was held on 6/21, restarting 6/22@1200   Labs:    Component Value Date/Time   K 4.8 10/20/2019 0316   MG 2.2 10/20/2019 0316     Plan: Potassium: K >/= 4: No additional supplementation needed  Magnesium: Mg > 2: No additional supplementation needed   Thank you for allowing pharmacy to participate in this patient's care   Cristela Felt, PharmD PGY1 Pharmacy Resident [employer]: [phone]  10/20/2019 7:02 AM  Please check AMION for all [place] phone numbers After 10:00 PM, call [place] [phone]

## 2019-10-20 NOTE — Progress Notes (Addendum)
Electrophysiology Rounding Note  Patient Name: [name] Date of Encounter: 10/20/2019  Primary Cardiologist: No primary care provider on file. Electrophysiologist: Thompson Grayer, MD   Subjective   The patient is doing well today.  At this time, the patient denies chest pain, shortness of breath, or any new concerns.  QTc remains stable on his chronic dose dofetilide.   Inpatient Medications    Scheduled Meds: . amLODipine  5 mg Oral Daily  . apixaban  5 mg Oral BID  . bisoprolol  5 mg Oral Daily  . cholecalciferol  8,000 Units Oral QHS  . dofetilide  250 mcg Oral BID  . doxycycline  100 mg Oral Q12H  . feeding supplement (ENSURE ENLIVE)  237 mL Oral BID BM  . gabapentin  1,200 mg Oral QHS  . gabapentin  600 mg Oral BID WC  . insulin aspart  0-5 Units Subcutaneous QHS  . insulin aspart  0-9 Units Subcutaneous TID WC  . levothyroxine  175 mcg Oral QAC breakfast  . loratadine  10 mg Oral Daily  . methylPREDNISolone (SOLU-MEDROL) injection  60 mg Intravenous Q8H  . mometasone-formoterol  2 puff Inhalation BID  . pantoprazole  40 mg Oral BID AC  . rOPINIRole  2 mg Oral QHS  . rosuvastatin  10 mg Oral QHS  . spironolactone  25 mg Oral Daily  . traMADol  50 mg Oral BID  . traZODone  25 mg Oral QHS   Continuous Infusions:  PRN Meds: HYDROcodone-acetaminophen, ipratropium-albuterol, ondansetron, polyethylene glycol powder   Vital Signs    Vitals:   10/19/19 2313 10/20/19 0311 10/20/19 0417 10/20/19 0739  BP: (!) 153/80 (!) 151/78    Pulse: 64 96 74 67  Resp: 16 17  16   Temp: 97.8 F (36.6 C) 97.7 F (36.5 C)    TempSrc: Oral Oral    SpO2: 100% 100% 93% 99%  Weight:   86.7 kg   Height:        Intake/Output Summary (Last 24 hours) at 10/20/2019 0746 Last data filed at 10/20/2019 0313 Gross per 24 hour  Intake --  Output 1600 ml  Net -1600 ml   Filed Weights   10/18/19 0459 10/19/19 0446 10/20/19 0417  Weight: 87.5 kg 87.1 kg 86.7 kg    Physical  Exam    GEN- The patient is well appearing, alert and oriented x 3 today.   Head- normocephalic, atraumatic Eyes-  Sclera clear, conjunctiva pink Ears- hearing intact Oropharynx- clear Neck- supple Lungs- Clear to ausculation bilaterally, normal work of breathing Heart- Regular rate and rhythm, no murmurs, rubs or gallops GI- soft, NT, ND, + BS Extremities- no clubbing, cyanosis, or edema Skin- no rash or lesion Psych- euthymic mood, full affect Neuro- strength and sensation are intact  Labs    CBC No results for input(s): WBC, NEUTROABS, HGB, HCT, MCV, PLT in the last 72 hours. Basic Metabolic Panel Recent Labs    10/19/19 0452 10/20/19 0316  NA 133* 133*  K 4.9 4.8  CL 99 99  CO2 25 24  GLUCOSE 151* 177*  BUN 30* 34*  CREATININE 1.37* 1.25*  CALCIUM 9.0 9.0  MG 2.2 2.2   Liver Function Tests No results for input(s): AST, ALT, ALKPHOS, BILITOT, PROT, ALBUMIN in the last 72 hours. No results for input(s): LIPASE, AMYLASE in the last 72 hours. Cardiac Enzymes No results for input(s): CKTOTAL, CKMB, CKMBINDEX, TROPONINI in the last 72 hours.   Telemetry    A-paced -  V paced 60-70s (personally reviewed)  Radiology    No results found.  Patient Profile     [name] a 69 y.o.malewith a hx of persistent atrial fibrillation, hypertension, complete heart block status post pacemakerwho is being seen today for the evaluation of atrial fibrillation  Assessment & Plan    1. Persistent atrial fibrillation/Paroxysmal atrial flutter h/o AF ablation 2013/[facility] S/p TEE DCCV 10/19/2019 He remains in NSR/ ApacedVpaced Continue tikosyn 250 mcg BID (chronic dose).  Continue eliquis for CHA2DS2VASC of at least 4    He previously failed amiodarone due to suspected pulmonary toxicity in 2017 (mixed HF picture) Mild CAD by cath 2017 so not flecainide candidate, he has also failed it previously (2012) LA ID 35 mm by echo 10/2017.  2. Acute on chronic hypoxic  respiratory failure/COPD exacerbation Remains on ABX and steroids.  Per primary team  3. A/S s/p AVR Stable on previous echo and on TEE 10/19/2019.  4. 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 Pulmonary HTN Primarily venous by previous RHC Will need CHF team follow up.   He is doing well. From a cardiac perspective, he will not need further tikosyn monitoring after this mornings dose, and will be stable for discharge once OK from medicine perspective.   For questions or updates, please contact [place] Please consult [email or web address] for contact info under Cardiology/STEMI.  Signed, Shirley Friar, PA-C  10/20/2019, 7:46 AM    I have seen, examined the patient, and reviewed the above assessment and plan.  Changes to above are made where necessary.  On exam, RRR.  Now back in sinus.  No further inpatient EP workup required.  He should follow-up as an outpatient with Dr Haroldine Laws for RV failure/ pulmonary htn.  Co Sign: Thompson Grayer, MD

## 2019-10-21 ENCOUNTER — Encounter (HOSPITAL_COMMUNITY): Payer: Self-pay | Admitting: Internal Medicine

## 2019-10-21 LAB — CULTURE, BLOOD (ROUTINE X 2)
Culture: NO GROWTH
Culture: NO GROWTH
Special Requests: ADEQUATE
Special Requests: ADEQUATE

## 2019-10-28 ENCOUNTER — Encounter (HOSPITAL_COMMUNITY): Payer: Self-pay | Admitting: Nurse Practitioner

## 2019-10-28 ENCOUNTER — Other Ambulatory Visit: Payer: Self-pay

## 2019-10-28 ENCOUNTER — Encounter (HOSPITAL_COMMUNITY): Payer: Medicare Other | Admitting: Nurse Practitioner

## 2019-10-28 ENCOUNTER — Ambulatory Visit (HOSPITAL_COMMUNITY)
Admission: RE | Admit: 2019-10-28 | Discharge: 2019-10-28 | Disposition: A | Discharge status: Home / Self Care | Payer: Medicare Other | Source: Ambulatory Visit | Attending: Nurse Practitioner | Admitting: Nurse Practitioner

## 2019-10-28 VITALS — BP 122/68 | HR 71 | Ht 71.0 in | Wt 188.8 lb

## 2019-10-28 DIAGNOSIS — Z950 Presence of cardiac pacemaker: Secondary | ICD-10-CM | POA: Diagnosis not present

## 2019-10-28 DIAGNOSIS — D6869 Other thrombophilia: Secondary | ICD-10-CM

## 2019-10-28 DIAGNOSIS — I4819 Other persistent atrial fibrillation: Secondary | ICD-10-CM | POA: Diagnosis not present

## 2019-10-28 DIAGNOSIS — Z7951 Long term (current) use of inhaled steroids: Secondary | ICD-10-CM | POA: Diagnosis not present

## 2019-10-28 DIAGNOSIS — M797 Fibromyalgia: Secondary | ICD-10-CM | POA: Insufficient documentation

## 2019-10-28 DIAGNOSIS — Z881 Allergy status to other antibiotic agents status: Secondary | ICD-10-CM | POA: Diagnosis not present

## 2019-10-28 DIAGNOSIS — Z952 Presence of prosthetic heart valve: Secondary | ICD-10-CM | POA: Insufficient documentation

## 2019-10-28 DIAGNOSIS — Z7901 Long term (current) use of anticoagulants: Secondary | ICD-10-CM | POA: Diagnosis not present

## 2019-10-28 DIAGNOSIS — E669 Obesity, unspecified: Secondary | ICD-10-CM | POA: Diagnosis not present

## 2019-10-28 DIAGNOSIS — I441 Atrioventricular block, second degree: Secondary | ICD-10-CM | POA: Insufficient documentation

## 2019-10-28 DIAGNOSIS — I509 Heart failure, unspecified: Secondary | ICD-10-CM | POA: Diagnosis not present

## 2019-10-28 DIAGNOSIS — Z7952 Long term (current) use of systemic steroids: Secondary | ICD-10-CM | POA: Diagnosis not present

## 2019-10-28 DIAGNOSIS — J449 Chronic obstructive pulmonary disease, unspecified: Secondary | ICD-10-CM | POA: Insufficient documentation

## 2019-10-28 DIAGNOSIS — Z79899 Other long term (current) drug therapy: Secondary | ICD-10-CM | POA: Diagnosis not present

## 2019-10-28 DIAGNOSIS — I350 Nonrheumatic aortic (valve) stenosis: Secondary | ICD-10-CM | POA: Insufficient documentation

## 2019-10-28 DIAGNOSIS — I110 Hypertensive heart disease with heart failure: Secondary | ICD-10-CM | POA: Insufficient documentation

## 2019-10-28 DIAGNOSIS — G2581 Restless legs syndrome: Secondary | ICD-10-CM | POA: Insufficient documentation

## 2019-10-28 DIAGNOSIS — G4733 Obstructive sleep apnea (adult) (pediatric): Secondary | ICD-10-CM | POA: Diagnosis not present

## 2019-10-28 DIAGNOSIS — K219 Gastro-esophageal reflux disease without esophagitis: Secondary | ICD-10-CM | POA: Insufficient documentation

## 2019-10-28 DIAGNOSIS — Z87891 Personal history of nicotine dependence: Secondary | ICD-10-CM | POA: Insufficient documentation

## 2019-10-28 DIAGNOSIS — E785 Hyperlipidemia, unspecified: Secondary | ICD-10-CM | POA: Diagnosis not present

## 2019-10-28 DIAGNOSIS — Z9981 Dependence on supplemental oxygen: Secondary | ICD-10-CM | POA: Diagnosis not present

## 2019-10-28 DIAGNOSIS — I4891 Unspecified atrial fibrillation: Secondary | ICD-10-CM | POA: Diagnosis present

## 2019-10-28 DIAGNOSIS — E119 Type 2 diabetes mellitus without complications: Secondary | ICD-10-CM | POA: Diagnosis not present

## 2019-10-28 DIAGNOSIS — E039 Hypothyroidism, unspecified: Secondary | ICD-10-CM | POA: Diagnosis not present

## 2019-10-28 DIAGNOSIS — Z8249 Family history of ischemic heart disease and other diseases of the circulatory system: Secondary | ICD-10-CM | POA: Insufficient documentation

## 2019-10-28 LAB — BASIC METABOLIC PANEL
Anion gap: 11 (ref 5–15)
BUN: 27 mg/dL — ABNORMAL HIGH (ref 8–23)
CO2: 30 mmol/L (ref 22–32)
Calcium: 9.7 mg/dL (ref 8.9–10.3)
Chloride: 99 mmol/L (ref 98–111)
Creatinine, Ser: 1.91 mg/dL — ABNORMAL HIGH (ref 0.61–1.24)
GFR calc Af Amer: 40 mL/min — ABNORMAL LOW (ref >60)
GFR calc non Af Amer: 35 mL/min — ABNORMAL LOW (ref >60)
Glucose, Bld: 135 mg/dL — ABNORMAL HIGH (ref 70–99)
Potassium: 6 mmol/L — ABNORMAL HIGH (ref 3.5–5.1)
Sodium: 140 mmol/L (ref 135–145)

## 2019-10-28 LAB — MAGNESIUM: Magnesium: 2.3 mg/dL (ref 1.7–2.4)

## 2019-10-28 NOTE — Progress Notes (Signed)
Primary Care Physician: Velna Hatchet, MD Referring Physician  Dr. Rayann Heman  AHF: Dr. Virgina Jock is a 69 y.o. male with a h/o advanced COPD on home O2, with frequent flareup,A. fib on Tikosyn and systemic anticoagulation, IIDM. Patient presented to the ED on 6/19with increasing short of breath,wheezing, cough and poor oral intake for 3 days. Denies any fever chills.   Patient came to ED a day earlier for same acute issue and was discharged with p.o. steroid.His symptoms persisted and he was also not able to take his regular medications including Tikosyn. He returned back to ED on 6/19.   He had missed a couple of days of his Tikosyn as he felt nauseated and could not take his meds.He was in  afib.  EP saw pt in the hospital and restarted tikosyn He underwent successful cardioversion on 6/22. For his COPD he had po doxycycline in the hospital as well as IV steroids.   In the afib clinic , today, he is in [place] and feels improved. His breathing is also improved.  He is a sensed, v paced today. Being compliant with his meds.   Today, he denies symptoms of palpitations, chest pain, shortness of breath, orthopnea, PND, lower extremity edema, dizziness, presyncope, syncope, or neurologic sequela. The patient is tolerating medications without difficulties and is otherwise without complaint today.   Past Medical History:  Diagnosis Date  . Aortic stenosis, mild   . Arthritis of hand    "just a little bit in both hands" (03/31/2012)  . Asthma    "little bit" (03/31/2012)  . Atrial fibrillation [facility])    dx '04; DCCV '04, placed on flecainide, failed DCCV 04/2010, flecainide stopped; s/p successful A.fib ablation 01/31/12  . CHF (congestive heart failure) ([place])   . COPD (chronic obstructive pulmonary disease) ([place])   . Diabetes mellitus without complication (HCC)    borderline  . DJD (degenerative joint disease)   . Exertional dyspnea   . Fibromyalgia   . First degree AV  block    post ablation, ~429ms  . GERD (gastroesophageal reflux disease)   . Heart murmur   . Hyperlipidemia   . Hypertension   . Hypothyroidism    S/P radiation  . Left atrial enlargement    LA size 17mm by echo 11/21/11  . Mitral regurgitation    trivial  . Obesity   . Obstructive sleep apnea    mild by sleep study 2013; pt stated he does not have a machine because "it wasn't bad enough for him to have one"  . Pacemaker 03/31/2012  . Restless leg syndrome   . Second degree AV block   . Splenomegaly   . Visit for monitoring Tikosyn therapy 04/16/2016   Past Surgical History:  Procedure Laterality Date  . AORTIC VALVE REPLACEMENT N/A 08/22/2015   Procedure: AORTIC VALVE REPLACEMENT (AVR);  Surgeon: Ivin Poot, MD;  Location: [place];  Service: Open Heart Surgery;  Laterality: N/A;  . ATRIAL FIBRILLATION ABLATION  01/30/2012   PVI by Dr. Rayann Heman  . ATRIAL FIBRILLATION ABLATION N/A 01/31/2012   Procedure: ATRIAL FIBRILLATION ABLATION;  Surgeon: Thompson Grayer, MD;  Location: [facility] CATH LAB;  Service: Cardiovascular;  Laterality: N/A;  . BACK SURGERY     X 3  . CARDIAC CATHETERIZATION N/A 08/09/2015   Procedure: Right/Left Heart Cath and Coronary Angiography;  Surgeon: Peter M Martinique, MD;  Location: [place] CV LAB;  Service: Cardiovascular;  Laterality: N/A;  . CARDIAC  CATHETERIZATION N/A 02/05/2016   Procedure: Right/Left Heart Cath and Coronary Angiography;  Surgeon: Jettie Booze, MD;  Location: [place] CV LAB;  Service: Cardiovascular;  Laterality: N/A;  . CARDIAC CATHETERIZATION N/A 04/17/2016   Procedure: Right Heart Cath;  Surgeon: Jolaine Artist, MD;  Location: [place] CV LAB;  Service: Cardiovascular;  Laterality: N/A;  . CARDIOVASCULAR STRESS TEST  03/12/2002   EF 48%, NO EVIDENCE OF ISCHEMIA  . CARDIOVERSION  01/2012; 03/31/2012  . CARDIOVERSION N/A 02/25/2012   Procedure: CARDIOVERSION;  Surgeon: Thompson Grayer, MD;  Location: [facility] CATH LAB;  Service:  Cardiovascular;  Laterality: N/A;  . CARDIOVERSION N/A 03/31/2012   Procedure: CARDIOVERSION;  Surgeon: Thompson Grayer, MD;  Location: [facility] CATH LAB;  Service: Cardiovascular;  Laterality: N/A;  . CARDIOVERSION N/A 11/23/2015   Procedure: CARDIOVERSION;  Surgeon: Larey Dresser, MD;  Location: [place];  Service: Cardiovascular;  Laterality: N/A;  . CARDIOVERSION N/A 04/08/2016   Procedure: CARDIOVERSION;  Surgeon: Jolaine Artist, MD;  Location: [facility] ENDOSCOPY;  Service: Cardiovascular;  Laterality: N/A;  . CARDIOVERSION N/A 09/14/2018   Procedure: CARDIOVERSION;  Surgeon: Josue Hector, MD;  Location: [facility] ENDOSCOPY;  Service: Cardiovascular;  Laterality: N/A;  . CARDIOVERSION N/A 10/19/2019   Procedure: CARDIOVERSION;  Surgeon: Jolaine Artist, MD;  Location: [place];  Service: Cardiovascular;  Laterality: N/A;  . CLIPPING OF ATRIAL APPENDAGE N/A 08/22/2015   Procedure: CLIPPING OF ATRIAL APPENDAGE;  Surgeon: Ivin Poot, MD;  Location: [place];  Service: Open Heart Surgery;  Laterality: N/A;  . DOPPLER ECHOCARDIOGRAPHY  03/11/2002   EF 70-75%  . FINGER SURGERY Left    Middle finger  . FINGER TENDON REPAIR  1980's   "right little finger" (03/31/2012)  . INSERT / REPLACE / REMOVE PACEMAKER     [employer]  . IR RADIOLOGIST EVAL & MGMT  05/06/2019  . IR RADIOLOGIST EVAL & MGMT  10/06/2019  . LEFT AND RIGHT HEART CATHETERIZATION WITH CORONARY ANGIOGRAM N/A 10/27/2012   Procedure: LEFT AND RIGHT HEART CATHETERIZATION WITH CORONARY ANGIOGRAM;  Surgeon: Laverda Page, MD;  Location: [facility] CATH LAB;  Service: Cardiovascular;  Laterality: N/A;  . LUMBAR DISC SURGERY  1980's X2;  2000's  . MAZE N/A 08/22/2015   Procedure: MAZE;  Surgeon: Ivin Poot, MD;  Location: [place];  Service: Open Heart Surgery;  Laterality: N/A;  . PACEMAKER INSERTION  03/31/2012   STJ Accent DR pacemaker implanted by Dr Rayann Heman  . PERMANENT PACEMAKER INSERTION N/A 03/31/2012   Procedure: PERMANENT PACEMAKER INSERTION;   Surgeon: Thompson Grayer, MD;  Location: [facility] CATH LAB;  Service: Cardiovascular;  Laterality: N/A;  . TEE WITHOUT CARDIOVERSION  01/30/2012   Procedure: TRANSESOPHAGEAL ECHOCARDIOGRAM (TEE);  Surgeon: Larey Dresser, MD;  Location: [place];  Service: Cardiovascular;  Laterality: N/A;  ablation next day  . TEE WITHOUT CARDIOVERSION N/A 08/22/2015   Procedure: TRANSESOPHAGEAL ECHOCARDIOGRAM (TEE);  Surgeon: Ivin Poot, MD;  Location: [place];  Service: Open Heart Surgery;  Laterality: N/A;  . TEE WITHOUT CARDIOVERSION N/A 10/19/2019   Procedure: TRANSESOPHAGEAL ECHOCARDIOGRAM (TEE);  Surgeon: Jolaine Artist, MD;  Location: [facility] ENDOSCOPY;  Service: Cardiovascular;  Laterality: N/A;  . US ECHOCARDIOGRAPHY  08/07/2009   EF 55-60%    Current Outpatient Medications  Medication Sig Dispense Refill  . albuterol (PROVENTIL HFA;VENTOLIN HFA) 108 (90 Base) MCG/ACT inhaler Inhale 2 puffs into the lungs every 6 (six) hours as needed for wheezing or shortness of breath. 1 Inhaler 5  .  amLODipine (NORVASC) 10 MG tablet Take 0.5 tablets (5 mg total) by mouth daily. 30 tablet 11  . apixaban (ELIQUIS) 5 MG TABS tablet TAKE 1 TABLET(5 MG) BY MOUTH TWICE DAILY (Patient taking differently: Take 5 mg by mouth 2 (two) times daily. ) 180 tablet 3  . bisoprolol (ZEBETA) 5 MG tablet Take 1 tablet (5 mg total) by mouth daily. 30 tablet 0  . Cholecalciferol (VITAMIN D) 2000 UNITS CAPS Take 8,000 Units by mouth at bedtime.     . dofetilide (TIKOSYN) 250 MCG capsule Take 1 capsule (250 mcg total) by mouth 2 (two) times daily. 180 capsule 1  . fexofenadine (ALLEGRA) 180 MG tablet Take 1 tablet (180 mg total) by mouth daily. (Patient taking differently: Take 180 mg by mouth daily as needed for allergies or rhinitis. ) 30 tablet 1  . gabapentin (NEURONTIN) 300 MG capsule Take 600-1,200 mg by mouth See admin instructions. Take 2 capsules ( 600 mg) by mouth in the morning and  at lunchtime, and take 4 capsules (1,200 mg) at  bedtime  3  . HYDROcodone-acetaminophen (NORCO/VICODIN) 5-325 MG tablet Take 1 tablet by mouth 2 (two) times daily as needed (pain).     [name] levothyroxine (SYNTHROID, LEVOTHROID) 175 MCG tablet Take 175 mcg by mouth daily before breakfast.    . MAGNESIUM PO Take 1 tablet by mouth at bedtime.    . mometasone-formoterol (DULERA) 100-5 MCG/ACT AERO Inhale 2 puffs into the lungs 2 (two) times daily. 8.8 g 0  . ondansetron (ZOFRAN ODT) 4 MG disintegrating tablet Take 1 tablet (4 mg total) by mouth every 8 (eight) hours as needed for nausea. (Patient taking differently: Take 4 mg by mouth 2 (two) times daily. ) 20 tablet 0  . OXYGEN Inhale 2 L into the lungs continuous.    . pantoprazole (PROTONIX) 40 MG tablet Take 40 mg by mouth daily.    . polyethylene glycol powder (GLYCOLAX/MIRALAX) powder Take 17 g by mouth daily. (Patient taking differently: Take 17 g by mouth daily as needed for mild constipation. ) 500 g 0  . potassium chloride SA (KLOR-CON) 20 MEQ tablet Take 1 tablet (20 mEq total) by mouth 2 (two) times daily. Needs appt for further refills (Patient taking differently: Take 20 mEq by mouth See admin instructions. Take one tablet (20 meq) by mouth in the morning and at night on the days that you take torsemide (every other day)) 180 tablet 0  . predniSONE (DELTASONE) 10 MG tablet Take 4 tablets (40 mg) daily for 2 days, then, Take 3 tablets (30 mg) daily for 2 days, then, Take 2 tablets (20 mg) daily for 2 days, then, Take 1 tablets (10 mg) daily for 1 days, then 5 mg daily for long term. 60 tablet 0  . rOPINIRole (REQUIP) 1 MG tablet Take 1 tablet (1 mg total) by mouth daily. (Patient taking differently: Take 2 mg by mouth at bedtime. ) 90 tablet 2  . rosuvastatin (CRESTOR) 10 MG tablet Take 10 mg by mouth at bedtime.     [name] spironolactone (ALDACTONE) 25 MG tablet Take 1 tablet (25 mg total) by mouth daily. 90 tablet 3  . torsemide (DEMADEX) 20 MG tablet TAKE 2 TABLETS BY MOUTH  DAILY (Patient  taking differently: Take 40 mg by mouth every other day. ) 180 tablet 3  . traMADol-acetaminophen (ULTRACET) 37.5-325 MG tablet Take 2 tablets by mouth 2 (two) times daily.    . traZODone (DESYREL) 50 MG tablet Take 12.5 mg by  mouth at bedtime.      No current facility-administered medications for this encounter.    Allergies  Allergen Reactions  . Meloxicam Rash  . 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 syndrome 09/02/15, resolved with diphenhydramine and slowing of rate    Social History   Socioeconomic History  . Marital status: Married    Spouse name: Not on file  . Number of children: Not on file  . Years of education: Not on file  . Highest education level: Not on file  Occupational History  . Not on file  Tobacco Use  . Smoking status: Former Smoker    Packs/day: 0.50    Years: 15.00    Pack years: 7.50    Types: Cigarettes    Quit date: 07/18/1991    Years since quitting: 28.2  . Smokeless tobacco: Never Used  Vaping Use  . Vaping Use: Never used  Substance and Sexual Activity  . Alcohol use: Yes    Comment: occasional  . Drug use: Yes    Types: Marijuana    Comment: 02/2019  . Sexual activity: Not Currently  Other Topics Concern  . Not on file  Social History Narrative   Lives in [place] with spouse.  Works as a Air traffic controller.   Social Determinants of Health   Financial Resource Strain:   . Difficulty of Paying Living Expenses:   Food Insecurity:   . Worried About Charity fundraiser in the Last Year:   . Arboriculturist in the Last Year:   Transportation Needs:   . Film/video editor (Medical):   [name] Lack of Transportation (Non-Medical):   Physical Activity:   . Days of Exercise per Week:   . Minutes of Exercise per Session:   Stress:   . Feeling of Stress :   Social Connections:   . Frequency of Communication with Friends and Family:   . Frequency of Social Gatherings with Friends and Family:   . Attends Religious Services:    . Active Member of Clubs or Organizations:   . Attends Archivist Meetings:   [name] Marital Status:   Intimate Partner Violence:   . Fear of Current or Ex-Partner:   . Emotionally Abused:   [name] Physically Abused:   . Sexually Abused:     Family History  Problem Relation Age of Onset  . Heart failure Mother   . Stroke Father     ROS- All systems are reviewed and negative except as per the HPI above  Physical Exam: Vitals:   10/28/19 1454  Weight: 85.6 kg  Height: 5\' 11"  (1.803 m)   Wt Readings from Last 3 Encounters:  10/28/19 85.6 kg  10/20/19 86.7 kg  10/15/19 86.2 kg    Labs: Lab Results  Component Value Date   NA 133 (L) 10/20/2019   K 4.8 10/20/2019   CL 99 10/20/2019   CO2 24 10/20/2019   GLUCOSE 177 (H) 10/20/2019   BUN 34 (H) 10/20/2019   CREATININE 1.25 (H) 10/20/2019   CALCIUM 9.0 10/20/2019   MG 2.2 10/20/2019   Lab Results  Component Value Date   INR 1.6 (H) 10/18/2019   Lab Results  Component Value Date   CHOL 114 (L) 08/03/2015   HDL 30 (L) 08/03/2015   LDLCALC 62 08/03/2015   TRIG 111 08/03/2015     GEN- The patient is well appearing, alert and oriented x 3 today.   Head- normocephalic, atraumatic Eyes-  Sclera clear, conjunctiva pink Ears- hearing intact Oropharynx- clear Neck- supple, no JVP Lymph- no cervical lymphadenopathy Lungs- Clear to ausculation bilaterally, normal work of breathing Heart- Regular rate and rhythm, no murmurs, rubs or gallops, PMI not laterally displaced GI- soft, NT, ND, + BS Extremities- no clubbing, cyanosis, or edema MS- no significant deformity or atrophy Skin- no rash or lesion Psych- euthymic mood, full affect Neuro- strength and sensation are intact  EKG-a sensed v paced rhythm at 71 bpm, pr int 190 ms, qrs int 184 ms, qtc 504 ms( long appearing 2/2 pacing)    Assessment and Plan: 1. afib  Recent breakthrough with COPD exacerbation and not able to take Tikosyn 2/2 to poor oral  intake Reloaded with recent hospital stay and successful cardioversion  Continue tikosyn at 250 mcg bid  Continue bisoprolol at 5 mg bid  Bmet/mag   2. CHA2DS2VASc score of at least 4 Continue eliquis 5 mg bid and reminded not to interrupt anticoagulation  for 4 weeks s/p cardioversion He is pending a colonoscopy close to the end of that window and it may be best to delay a few weeks   3.COPD Improved since d/c  F/u with pulmonology 7/9   4. HF Weight stable   F/u with Dr. Haroldine Laws 7/22, and 9/13, Oda Kilts, PA  Geroge Baseman. [facility], [facility] [address] [place], [place] [address] [phone]

## 2019-11-02 ENCOUNTER — Other Ambulatory Visit (HOSPITAL_COMMUNITY): Payer: Self-pay

## 2019-11-02 MED ORDER — DOFETILIDE 250 MCG PO CAPS: 250.0000 ug | ORAL_CAPSULE | Freq: Two times a day (BID) | ORAL | 1 refills | Status: DC

## 2019-11-05 ENCOUNTER — Ambulatory Visit: Payer: Medicare Other | Admitting: Primary Care

## 2019-11-05 ENCOUNTER — Other Ambulatory Visit: Payer: Self-pay

## 2019-11-05 ENCOUNTER — Encounter: Payer: Self-pay | Admitting: Primary Care

## 2019-11-05 DIAGNOSIS — J441 Chronic obstructive pulmonary disease with (acute) exacerbation: Secondary | ICD-10-CM

## 2019-11-05 MED ORDER — BREZTRI AEROSPHERE 160-9-4.8 MCG/ACT IN AERO: 2.0000 | INHALATION_SPRAY | Freq: Two times a day (BID) | RESPIRATORY_TRACT | 0 refills | Status: DC

## 2019-11-05 MED ORDER — PREDNISONE 5 MG PO TABS: 5.0000 mg | ORAL_TABLET | Freq: Every day | ORAL | 3 refills | Status: DC

## 2019-11-05 NOTE — Assessment & Plan Note (Addendum)
-   Group D interms of symptoms; recently hospitalized for COPD exacerbation - Start Breztri two puffs twice a day (samples given today, start paper work for patient assistance) - Continue Albuterol rescue inhaler 2 puffs every 4-6 hours for breakthrough shortness of breath or wheezing  - Continue 5mg  prednisone daily- prescription send to pharmacy  - Continue Mucinex 600mg  twice daily for chest congestion  - FU in 2 months with Dr. Lamonte Sakai

## 2019-11-05 NOTE — Patient Instructions (Addendum)
Recommend: - Stop Dulera and/or Symbicort  - Start Breztri two puffs twice a day (samples given today, start paper work for patient assistance) - Continue Albuterol rescue inhaler 2 puffs every 4-6 hours for breakthrough shortness of breath or wheezing  - Continue 5mg  prednisone daily- prescription send to pharmacy  - Continue Mucinex 600mg  twice daily for chest congestion   Follow-up: - 1-2 months with Dr. Lamonte Sakai

## 2019-11-05 NOTE — Progress Notes (Signed)
@Patient  ID: [name], male    DOB: [birth year], 69 y.o.   MRN: [id number]  Chief Complaint  Patient presents with  . Hospitalization Follow-up    Referring provider: Velna Hatchet, MD  HPI: 69 year old male, former smoker. 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 Patient of Dr. Lamonte Sakai, 03/07/2016.  Maintained on Symbicort and supplemental oxygen as needed.  Recommended follow-up in 6 months.  Patient admitted to [facility] on October 16, 2019 for COPD exacerbation.  Patient reported increased shortness of breath, wheezing, cough and pleural oral intake for 3 days.  He was discharged from the emergency room with a course of oral steroids and represented to ED the following day with persistent symptoms.  WBC elevated at 17.3.  Chest x-ray showed chronic increased lung markings without evidence of acute cardiopulmonary disease.  CT angio did not show any pulmonary embolism, infiltrate or effusion.  He had marked severity coronary artery calcification and a stable 5 mm right lung nodule.  He was treated with IV steroids and 5-day course of oral doxycycline.  He was discharged on gradual oral prednisone taper.  It was recommended that he stay on 5 mg daily after completing this.   11/05/2019- interim hx Patient presents today for hospital follow-up.  He has not been seen in our office in over 3 years.  He was recently hospitalized in June for COPD exacerbation. He is currently on Dulera two puffs twice daily and prednisone 5mg  daily. He is unable to afford [facility], was told he could get samples in our office which we no longer receive. States that his breathing is a lot better. He has a chronic productive cough with  beige mucus. He uses albuterol rescue 3-4 times a week when weather is bad. He was seen inpatient on June 20th by Dr. Melvyn Novas who recommended he be tried on Breztri d/t group D copd symptoms. Denies fever, chest tightness or wheezing.   Allergies  Allergen Reactions  . Meloxicam Rash  . 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 syndrome 09/02/15, resolved with diphenhydramine and slowing of rate    Immunization History  Administered Date(s) Administered  . Influenza Split 01/27/2013, 02/28/2015  . Influenza,inj,Quad PF,6+ Mos 01/30/2016  . Influenza-Unspecified 01/30/2012, 01/27/2014    Past Medical History:  Diagnosis Date  . Aortic stenosis, mild   . Arthritis of hand    "just a little bit in both hands" (03/31/2012)  . Asthma    "little bit" (03/31/2012)  . Atrial fibrillation [facility])    dx '04; DCCV '04, placed on flecainide, failed DCCV 04/2010, flecainide stopped; s/p successful A.fib ablation 01/31/12  . CHF (congestive heart failure) ([place])   . COPD (chronic obstructive pulmonary disease) ([place])   . Diabetes mellitus without complication (HCC)    borderline  . DJD (degenerative joint disease)   . Exertional dyspnea   . Fibromyalgia   . First degree AV block    post ablation, ~4103ms  . GERD (gastroesophageal reflux disease)   . Heart murmur   . Hyperlipidemia   . Hypertension   . Hypothyroidism    S/P radiation  . Left atrial enlargement    LA size 19mm by echo 11/21/11  . Mitral regurgitation    trivial  . Obesity   . Obstructive sleep  apnea    mild by sleep study 2013; pt stated he does not have a machine because "it wasn't bad enough for him to have one"  . Pacemaker 03/31/2012  . Restless leg syndrome   . Second degree AV block   . Splenomegaly   . Visit for monitoring Tikosyn therapy 04/16/2016    Tobacco History: Social History   Tobacco Use  Smoking Status Former Smoker  . Packs/day: 0.50  . Years: 15.00  . Pack years: 7.50  . Types: Cigarettes  . Quit  date: 07/18/1991  . Years since quitting: 28.3  Smokeless Tobacco Never Used   Counseling given: Not Answered   Outpatient Medications Prior to Visit  Medication Sig Dispense Refill  . albuterol (PROVENTIL HFA;VENTOLIN HFA) 108 (90 Base) MCG/ACT inhaler Inhale 2 puffs into the lungs every 6 (six) hours as needed for wheezing or shortness of breath. 1 Inhaler 5  . amLODipine (NORVASC) 10 MG tablet Take 0.5 tablets (5 mg total) by mouth daily. 30 tablet 11  . apixaban (ELIQUIS) 5 MG TABS tablet TAKE 1 TABLET(5 MG) BY MOUTH TWICE DAILY (Patient taking differently: Take 5 mg by mouth 2 (two) times daily. ) 180 tablet 3  . Cholecalciferol (VITAMIN D) 2000 UNITS CAPS Take 8,000 Units by mouth at bedtime.     . dofetilide (TIKOSYN) 250 MCG capsule Take 1 capsule (250 mcg total) by mouth 2 (two) times daily. 180 capsule 1  . fexofenadine (ALLEGRA) 180 MG tablet Take 1 tablet (180 mg total) by mouth daily. (Patient taking differently: Take 180 mg by mouth daily as needed for allergies or rhinitis. ) 30 tablet 1  . gabapentin (NEURONTIN) 300 MG capsule Take 600-1,200 mg by mouth See admin instructions. Take 2 capsules ( 600 mg) by mouth in the morning and  at lunchtime, and take 4 capsules (1,200 mg) at bedtime  3  . HYDROcodone-acetaminophen (NORCO/VICODIN) 5-325 MG tablet Take 1 tablet by mouth 2 (two) times daily as needed (pain).     [name] levothyroxine (SYNTHROID, LEVOTHROID) 175 MCG tablet Take 175 mcg by mouth daily before breakfast.    . MAGNESIUM PO Take 400 mcg by mouth at bedtime. Has zinc/calcium    . mometasone-formoterol (DULERA) 100-5 MCG/ACT AERO Inhale 2 puffs into the lungs 2 (two) times daily. 8.8 g 0  . ondansetron (ZOFRAN ODT) 4 MG disintegrating tablet Take 1 tablet (4 mg total) by mouth every 8 (eight) hours as needed for nausea. (Patient taking differently: Take 4 mg by mouth as needed. ) 20 tablet 0  . OXYGEN Inhale 2 L into the lungs continuous.    . pantoprazole (PROTONIX) 40 MG  tablet Take 40 mg by mouth daily.    . polyethylene glycol powder (GLYCOLAX/MIRALAX) powder Take 17 g by mouth daily. (Patient taking differently: Take 17 g by mouth daily as needed for mild constipation. ) 500 g 0  . potassium chloride SA (KLOR-CON) 20 MEQ tablet Take 1 tablet (20 mEq total) by mouth 2 (two) times daily. Needs appt for further refills (Patient taking differently: Take 20 mEq by mouth See admin instructions. Take one tablet (20 meq) by mouth in the morning and at night on the days that you take torsemide (every other day)) 180 tablet 0  . rOPINIRole (REQUIP) 1 MG tablet Take 1 tablet (1 mg total) by mouth daily. (Patient taking differently: Take 4 mg by mouth at bedtime. ) 90 tablet 2  . rosuvastatin (CRESTOR) 10 MG tablet Take 10 mg by mouth  at bedtime.     [name] spironolactone (ALDACTONE) 25 MG tablet Take 1 tablet (25 mg total) by mouth daily. 90 tablet 3  . torsemide (DEMADEX) 20 MG tablet TAKE 2 TABLETS BY MOUTH  DAILY (Patient taking differently: Take 40 mg by mouth every other day. ) 180 tablet 3  . traMADol-acetaminophen (ULTRACET) 37.5-325 MG tablet Take 2 tablets by mouth 2 (two) times daily.    . traZODone (DESYREL) 50 MG tablet Take 12.5 mg by mouth at bedtime.     . bisoprolol (ZEBETA) 5 MG tablet Take 1 tablet (5 mg total) by mouth daily. 30 tablet 0  . predniSONE (DELTASONE) 10 MG tablet Take 4 tablets (40 mg) daily for 2 days, then, Take 3 tablets (30 mg) daily for 2 days, then, Take 2 tablets (20 mg) daily for 2 days, then, Take 1 tablets (10 mg) daily for 1 days, then 5 mg daily for long term. (Patient taking differently: Take 5 mg by mouth daily. Taking 5mg  by mouth daily) 60 tablet 0   No facility-administered medications prior to visit.   Review of Systems  Review of Systems  Constitutional: Negative.   Respiratory: Positive for cough and shortness of breath. Negative for chest tightness and wheezing.   Cardiovascular: Negative.      Physical Exam  BP 110/62  (BP Location: Left Arm, Cuff Size: Normal)   Pulse 60   Temp (!) 97.3 F (36.3 C) (Oral)   Ht 5\' 11"  (1.803 m)   Wt 198 lb (89.8 kg)   SpO2 97%   BMI 27.62 kg/m  Physical Exam Constitutional:      Appearance: Normal appearance.  HENT:     Head: Normocephalic and atraumatic.     Mouth/Throat:     Mouth: Mucous membranes are moist.     Pharynx: Oropharynx is clear.  Cardiovascular:     Rate and Rhythm: Normal rate and regular rhythm.  Pulmonary:     Effort: Pulmonary effort is normal.     Breath sounds: Normal breath sounds.     Comments: LSC, on oxygen Skin:    General: Skin is warm and dry.  Neurological:     General: No focal deficit present.     Mental Status: He is alert and oriented to person, place, and time. Mental status is at baseline.  Psychiatric:        Mood and Affect: Mood normal.        Behavior: Behavior normal.        Thought Content: Thought content normal.        Judgment: Judgment normal.      Lab Results:  CBC    Component Value Date/Time   WBC 10.3 10/17/2019 0653   RBC 4.03 (L) 10/17/2019 0653   HGB 11.9 (L) 10/17/2019 0653   HCT 38.7 (L) 10/17/2019 0653   PLT 147 (L) 10/17/2019 0653   MCV 96.0 10/17/2019 0653   MCH 29.5 10/17/2019 0653   MCHC 30.7 10/17/2019 0653   RDW 14.6 10/17/2019 0653   LYMPHSABS 1.0 10/16/2019 1312   MONOABS 1.2 (H) 10/16/2019 1312   EOSABS 0.0 10/16/2019 1312   BASOSABS 0.0 10/16/2019 1312    BMET    Component Value Date/Time   NA 140 10/28/2019 1520   K 6.0 (H) 10/28/2019 1520   CL 99 10/28/2019 1520   CO2 30 10/28/2019 1520   GLUCOSE 135 (H) 10/28/2019 1520   BUN 27 (H) 10/28/2019 1520   CREATININE 1.91 (H) 10/28/2019 1520  CREATININE 1.65 (H) 01/29/2016 1543   CALCIUM 9.7 10/28/2019 1520   GFRNONAA 35 (L) 10/28/2019 1520   GFRAA 40 (L) 10/28/2019 1520    BNP    Component Value Date/Time   BNP 243.1 (H) 10/16/2019 1312   BNP 406.6 (H) 01/29/2016 1543    ProBNP    Component Value  Date/Time   PROBNP 470.0 (H) 01/15/2016 1708    Imaging: CUP PACEART REMOTE DEVICE CHECK  Result Date: 11/15/2019 Scheduled remote reviewed. Normal device function.  AF burden 4% since 10-18-19. Longest AMS since DCCV on 10-19-19 was 11 minutes. On Eliquis and Tikosyn. Next remote 91 days.    [place]    Assessment & Plan:   COPD (chronic obstructive pulmonary disease) ([place]) - Group D interms of symptoms; recently hospitalized for COPD exacerbation - Start Breztri two puffs twice a day (samples given today, start paper work for patient assistance) - Continue Albuterol rescue inhaler 2 puffs every 4-6 hours for breakthrough shortness of breath or wheezing  - Continue 5mg  prednisone daily- prescription send to pharmacy  - Continue Mucinex 600mg  twice daily for chest congestion       Martyn Ehrich, NP 11/29/2019

## 2019-11-15 ENCOUNTER — Ambulatory Visit (INDEPENDENT_AMBULATORY_CARE_PROVIDER_SITE_OTHER): Payer: Medicare Other | Admitting: *Deleted

## 2019-11-15 DIAGNOSIS — I480 Paroxysmal atrial fibrillation: Secondary | ICD-10-CM

## 2019-11-15 LAB — CUP PACEART REMOTE DEVICE CHECK
Battery Remaining Longevity: 12 mo
Battery Remaining Percentage: 10 %
Battery Voltage: 2.71 V
Brady Statistic AP VP Percent: 53 %
Brady Statistic AP VS Percent: 1 %
Brady Statistic AS VP Percent: 47 %
Brady Statistic AS VS Percent: 1 %
Brady Statistic RA Percent Paced: 51 %
Brady Statistic RV Percent Paced: 99 %
Date Time Interrogation Session: 20210719141022
Implantable Lead Implant Date: 20131203
Implantable Lead Implant Date: 20131203
Implantable Lead Location: 753859
Implantable Lead Location: 753860
Implantable Lead Model: 1948
Implantable Pulse Generator Implant Date: 20131203
Lead Channel Impedance Value: 410 Ohm
Lead Channel Impedance Value: 640 Ohm
Lead Channel Pacing Threshold Amplitude: 0.75 V
Lead Channel Pacing Threshold Amplitude: 0.875 V
Lead Channel Pacing Threshold Pulse Width: 0.5 ms
Lead Channel Pacing Threshold Pulse Width: 0.8 ms
Lead Channel Sensing Intrinsic Amplitude: 3.7 mV
Lead Channel Sensing Intrinsic Amplitude: 6.9 mV
Lead Channel Setting Pacing Amplitude: 1 V
Lead Channel Setting Pacing Amplitude: 1.875
Lead Channel Setting Pacing Pulse Width: 0.8 ms
Lead Channel Setting Sensing Sensitivity: 4 mV
Pulse Gen Model: 2210
Pulse Gen Serial Number: 7408461

## 2019-11-16 ENCOUNTER — Other Ambulatory Visit (HOSPITAL_COMMUNITY): Payer: Self-pay

## 2019-11-16 MED ORDER — BISOPROLOL FUMARATE 5 MG PO TABS: 5.0000 mg | ORAL_TABLET | Freq: Every day | ORAL | 2 refills | Status: DC

## 2019-11-16 NOTE — Progress Notes (Signed)
Remote pacemaker transmission.   

## 2019-11-18 ENCOUNTER — Encounter (HOSPITAL_COMMUNITY): Payer: Self-pay | Admitting: Internal Medicine

## 2019-11-18 ENCOUNTER — Other Ambulatory Visit: Payer: Self-pay

## 2019-11-18 ENCOUNTER — Telehealth (HOSPITAL_COMMUNITY): Payer: Self-pay | Admitting: Pharmacy Technician

## 2019-11-18 ENCOUNTER — Ambulatory Visit (HOSPITAL_COMMUNITY)
Admission: RE | Admit: 2019-11-18 | Discharge: 2019-11-18 | Disposition: A | Discharge status: Home / Self Care | Payer: Medicare Other | Source: Ambulatory Visit | Attending: Internal Medicine | Admitting: Internal Medicine

## 2019-11-18 VITALS — BP 130/72 | HR 72 | Wt 192.0 lb

## 2019-11-18 DIAGNOSIS — Z952 Presence of prosthetic heart valve: Secondary | ICD-10-CM | POA: Insufficient documentation

## 2019-11-18 DIAGNOSIS — I440 Atrioventricular block, first degree: Secondary | ICD-10-CM | POA: Diagnosis not present

## 2019-11-18 DIAGNOSIS — E039 Hypothyroidism, unspecified: Secondary | ICD-10-CM | POA: Insufficient documentation

## 2019-11-18 DIAGNOSIS — Z79890 Hormone replacement therapy (postmenopausal): Secondary | ICD-10-CM | POA: Diagnosis not present

## 2019-11-18 DIAGNOSIS — I272 Pulmonary hypertension, unspecified: Secondary | ICD-10-CM | POA: Insufficient documentation

## 2019-11-18 DIAGNOSIS — I110 Hypertensive heart disease with heart failure: Secondary | ICD-10-CM | POA: Insufficient documentation

## 2019-11-18 DIAGNOSIS — Z6826 Body mass index (BMI) 26.0-26.9, adult: Secondary | ICD-10-CM | POA: Insufficient documentation

## 2019-11-18 DIAGNOSIS — Z79899 Other long term (current) drug therapy: Secondary | ICD-10-CM | POA: Insufficient documentation

## 2019-11-18 DIAGNOSIS — K219 Gastro-esophageal reflux disease without esophagitis: Secondary | ICD-10-CM | POA: Diagnosis not present

## 2019-11-18 DIAGNOSIS — J9611 Chronic respiratory failure with hypoxia: Secondary | ICD-10-CM | POA: Diagnosis not present

## 2019-11-18 DIAGNOSIS — N179 Acute kidney failure, unspecified: Secondary | ICD-10-CM | POA: Insufficient documentation

## 2019-11-18 DIAGNOSIS — E785 Hyperlipidemia, unspecified: Secondary | ICD-10-CM | POA: Insufficient documentation

## 2019-11-18 DIAGNOSIS — Z950 Presence of cardiac pacemaker: Secondary | ICD-10-CM | POA: Insufficient documentation

## 2019-11-18 DIAGNOSIS — I350 Nonrheumatic aortic (valve) stenosis: Secondary | ICD-10-CM | POA: Insufficient documentation

## 2019-11-18 DIAGNOSIS — I480 Paroxysmal atrial fibrillation: Secondary | ICD-10-CM | POA: Diagnosis not present

## 2019-11-18 DIAGNOSIS — J449 Chronic obstructive pulmonary disease, unspecified: Secondary | ICD-10-CM | POA: Insufficient documentation

## 2019-11-18 DIAGNOSIS — I2510 Atherosclerotic heart disease of native coronary artery without angina pectoris: Secondary | ICD-10-CM | POA: Diagnosis not present

## 2019-11-18 DIAGNOSIS — Z7901 Long term (current) use of anticoagulants: Secondary | ICD-10-CM | POA: Diagnosis not present

## 2019-11-18 DIAGNOSIS — Z7951 Long term (current) use of inhaled steroids: Secondary | ICD-10-CM | POA: Insufficient documentation

## 2019-11-18 DIAGNOSIS — M797 Fibromyalgia: Secondary | ICD-10-CM | POA: Insufficient documentation

## 2019-11-18 DIAGNOSIS — E119 Type 2 diabetes mellitus without complications: Secondary | ICD-10-CM | POA: Insufficient documentation

## 2019-11-18 DIAGNOSIS — E669 Obesity, unspecified: Secondary | ICD-10-CM | POA: Insufficient documentation

## 2019-11-18 DIAGNOSIS — I5032 Chronic diastolic (congestive) heart failure: Secondary | ICD-10-CM | POA: Diagnosis present

## 2019-11-18 DIAGNOSIS — G2581 Restless legs syndrome: Secondary | ICD-10-CM | POA: Insufficient documentation

## 2019-11-18 DIAGNOSIS — G4733 Obstructive sleep apnea (adult) (pediatric): Secondary | ICD-10-CM | POA: Diagnosis not present

## 2019-11-18 DIAGNOSIS — Z7952 Long term (current) use of systemic steroids: Secondary | ICD-10-CM | POA: Diagnosis not present

## 2019-11-18 DIAGNOSIS — Z87891 Personal history of nicotine dependence: Secondary | ICD-10-CM | POA: Insufficient documentation

## 2019-11-18 NOTE — Telephone Encounter (Addendum)
Saw patient in clinic today and started BMS application for patient assistance. The [employer] has taken Eliquis off their list of available funds. Will fax application in once provider signature is obtained.  The patient is going to bring in his OOP and POI in as well.  Will follow up.

## 2019-11-18 NOTE — Progress Notes (Signed)
Advanced Heart Failure Clinic Note   Primary Care: Velna Hatchet, MD Primary Cardiologist: Dr. Acie Fredrickson Primary HF: Dr. Haroldine Laws   HPI:  [name] is a 69 y.o. 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   Admitte 02/02/16 with severe DOE with any exercise or walking 10-20 feet. Pt admitted and planned for [facility] 02/05/16. Diuresed with IV lasix with rapid improvement of symptoms. Overall diuresed 15.2 L and down 14 lbs. Discharge weight was 205 lbs. L/RHC 02/05/16 with no significant CAD. RHC showed significant difference between PCWP (50) and LVEDP (30) suggestive of MS but no MS on Echo. Weight at d/c was 205 pounds.  Echo 02/02/16 LVEF 60-65%, Grade 3 DD. Mild AI, Mild MR, RV mildly dilated and severely reduced. RA mildly dilated. PA peak pressure 74 mm Hg. Concerns for restrictive cardiomyopathy.  Admitted 12/19 -> 04/19/16 for Tikosyn load. RHC completed during that admission as below.   Admitted 6/21 with recurrent respiratory failure and lactic acidosis felt to be mostly COPD flare. Had recurrent AF and underwent TEE-DC-CV on 10/19/19. Tikosyn continued   LEFT VENTRICLE: EF = 60% No WMA RIGHT VENTRICLE: Moderately HK LEFT ATRIUM: Moderately to severely dilated. LA diameter 5.1 cm LEFT ATRIAL APPENDAGE: Surgically amputated. No clot in residual small pouch AORTIC VALVE:  Bioprosthetic valve. Functions normally. No AI/AS MITRAL VALVE:    Slight restriction of posterior leaflet 3+ posterior MR TRICUSPID VALVE: Normal severe TR. RVSP ~ 4mmHG INTERATRIAL SEPTUM: No PFO/ASD  He presents today for post-hospital f/u for pulmonary HTN & RV failure. Says he is feeling back to his baseline. Remains active around the house. Works in the yard and uses the chain saw. Stable DOE. No edema, orthopnea or PND. Wears 2L O2 and sats always > 90%. No syncope or  presyncope. No angina Occasional wheezing. No bleeding on Eliquis   Studies:  ABI 06/24/18 Normal  PFTs 4/17  FEV1  1.6 (47%) FVC 2.25 (49%) DLCO 54%  L/RHC 02/05/16 - Mid RCA lesion, 10 %stenosed. - Ost Cx to Prox Cx lesion, 20 %stenosed. - LV end diastolic pressure is moderately elevated. - Hemodynamic findings consistent with severe pulmonary hypertension. - No evidence of discordance on the LV/RV tracing. No evidence of pericardial constriction. - Severly elevated PCWP. - PA sat 51%. CO 6.2 L/min. Cardiac index 2.87. - No significant aortic valve gradient. - There is no aortic valve stenosis.  RHC Procedural Findings: Hemodynamics (mmHg) RA mean 30 RV 87/9 (26) PA 100/56 (72) PCWP 51 LVEDP 31 AO 142/73 (100) Cardiac Output (Fick) 6.21 Cardiac Index (Fick) 2.87 PVR = 3.5 WU   RHC 04/17/16 RA = 13 RV = 66/3/15 PA = 69/23 (42) PCW = 21 (v=38) Fick cardiac output/index = 6.3/2.9 PVR = 3.8 WU Ao sat = 87% (radial artery ABG) PA sat = 43%, 43% SVC sat = 50%   Past Medical History:  Diagnosis Date  . Aortic stenosis, mild   . Arthritis of hand    "just a little bit in both hands" (03/31/2012)  . Asthma    "little bit" (03/31/2012)  . Atrial fibrillation [facility])    dx '04; DCCV '04, placed on flecainide, failed DCCV 04/2010, flecainide stopped; s/p successful A.fib ablation 01/31/12  . CHF (congestive heart failure) ([place])   . COPD (chronic obstructive pulmonary disease) ([place])   . Diabetes mellitus without complication ([place])  borderline  . DJD (degenerative joint disease)   . Exertional dyspnea   . Fibromyalgia   . First degree AV block    post ablation, ~428ms  . GERD (gastroesophageal reflux disease)   . Heart murmur   . Hyperlipidemia   . Hypertension   . Hypothyroidism    S/P radiation  . Left atrial enlargement    LA size 72mm by echo 11/21/11  . Mitral regurgitation    trivial  . Obesity   . Obstructive sleep apnea    mild by sleep study 2013;  pt stated he does not have a machine because "it wasn't bad enough for him to have one"  . Pacemaker 03/31/2012  . Restless leg syndrome   . Second degree AV block   . Splenomegaly   . Visit for monitoring Tikosyn therapy 04/16/2016    Current Outpatient Medications  Medication Sig Dispense Refill  . albuterol (PROVENTIL HFA;VENTOLIN HFA) 108 (90 Base) MCG/ACT inhaler Inhale 2 puffs into the lungs every 6 (six) hours as needed for wheezing or shortness of breath. 1 Inhaler 5  . amLODipine (NORVASC) 10 MG tablet Take 0.5 tablets (5 mg total) by mouth daily. 30 tablet 11  . apixaban (ELIQUIS) 5 MG TABS tablet TAKE 1 TABLET(5 MG) BY MOUTH TWICE DAILY (Patient taking differently: Take 5 mg by mouth 2 (two) times daily. ) 180 tablet 3  . bisoprolol (ZEBETA) 5 MG tablet Take 1 tablet (5 mg total) by mouth daily. 30 tablet 2  . Budeson-Glycopyrrol-Formoterol (BREZTRI AEROSPHERE) 160-9-4.8 MCG/ACT AERO Inhale 2 puffs into the lungs 2 (two) times daily. 4 g 0  . Cholecalciferol (VITAMIN D) 2000 UNITS CAPS Take 8,000 Units by mouth at bedtime.     . dofetilide (TIKOSYN) 250 MCG capsule Take 1 capsule (250 mcg total) by mouth 2 (two) times daily. 180 capsule 1  . fexofenadine (ALLEGRA) 180 MG tablet Take 1 tablet (180 mg total) by mouth daily. (Patient taking differently: Take 180 mg by mouth daily as needed for allergies or rhinitis. ) 30 tablet 1  . gabapentin (NEURONTIN) 300 MG capsule Take 600-1,200 mg by mouth See admin instructions. Take 2 capsules ( 600 mg) by mouth in the morning and  at lunchtime, and take 4 capsules (1,200 mg) at bedtime  3  . HYDROcodone-acetaminophen (NORCO/VICODIN) 5-325 MG tablet Take 1 tablet by mouth 2 (two) times daily as needed (pain).     [name] levothyroxine (SYNTHROID, LEVOTHROID) 175 MCG tablet Take 175 mcg by mouth daily before breakfast.    . MAGNESIUM PO Take 400 mcg by mouth at bedtime. Has zinc/calcium    . mometasone-formoterol (DULERA) 100-5 MCG/ACT AERO Inhale 2  puffs into the lungs 2 (two) times daily. 8.8 g 0  . ondansetron (ZOFRAN ODT) 4 MG disintegrating tablet Take 1 tablet (4 mg total) by mouth every 8 (eight) hours as needed for nausea. (Patient taking differently: Take 4 mg by mouth as needed. ) 20 tablet 0  . OXYGEN Inhale 2 L into the lungs continuous.    . pantoprazole (PROTONIX) 40 MG tablet Take 40 mg by mouth daily.    . polyethylene glycol powder (GLYCOLAX/MIRALAX) powder Take 17 g by mouth daily. (Patient taking differently: Take 17 g by mouth daily as needed for mild constipation. ) 500 g 0  . potassium chloride SA (KLOR-CON) 20 MEQ tablet Take 1 tablet (20 mEq total) by mouth 2 (two) times daily. Needs appt for further refills (Patient taking differently: Take 20 mEq by mouth  See admin instructions. Take one tablet (20 meq) by mouth in the morning and at night on the days that you take torsemide (every other day)) 180 tablet 0  . predniSONE (DELTASONE) 5 MG tablet Take 1 tablet (5 mg total) by mouth daily with breakfast. 30 tablet 3  . rOPINIRole (REQUIP) 1 MG tablet Take 1 tablet (1 mg total) by mouth daily. (Patient taking differently: Take 4 mg by mouth at bedtime. ) 90 tablet 2  . rosuvastatin (CRESTOR) 10 MG tablet Take 10 mg by mouth at bedtime.     [name] spironolactone (ALDACTONE) 25 MG tablet Take 1 tablet (25 mg total) by mouth daily. 90 tablet 3  . torsemide (DEMADEX) 20 MG tablet TAKE 2 TABLETS BY MOUTH  DAILY (Patient taking differently: Take 40 mg by mouth every other day. ) 180 tablet 3  . traMADol-acetaminophen (ULTRACET) 37.5-325 MG tablet Take 2 tablets by mouth 2 (two) times daily.    . traZODone (DESYREL) 50 MG tablet Take 12.5 mg by mouth at bedtime.      No current facility-administered medications for this encounter.    Allergies  Allergen Reactions  . Meloxicam Rash  . 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 syndrome 09/02/15, resolved with diphenhydramine and slowing of rate      Social History    Socioeconomic History  . Marital status: Married    Spouse name: Not on file  . Number of children: Not on file  . Years of education: Not on file  . Highest education level: Not on file  Occupational History  . Not on file  Tobacco Use  . Smoking status: Former Smoker    Packs/day: 0.50    Years: 15.00    Pack years: 7.50    Types: Cigarettes    Quit date: 07/18/1991    Years since quitting: 28.3  . Smokeless tobacco: Never Used  Vaping Use  . Vaping Use: Never used  Substance and Sexual Activity  . Alcohol use: Yes    Comment: occasional  . Drug use: Yes    Types: Marijuana    Comment: 02/2019  . Sexual activity: Not Currently  Other Topics Concern  . Not on file  Social History Narrative   Lives in [place] with spouse.  Works as a Air traffic controller.   Social Determinants of Health   Financial Resource Strain:   . Difficulty of Paying Living Expenses:   Food Insecurity:   . Worried About Charity fundraiser in the Last Year:   . Arboriculturist in the Last Year:   Transportation Needs:   . Film/video editor (Medical):   [name] Lack of Transportation (Non-Medical):   Physical Activity:   . Days of Exercise per Week:   . Minutes of Exercise per Session:   Stress:   . Feeling of Stress :   Social Connections:   . Frequency of Communication with Friends and Family:   . Frequency of Social Gatherings with Friends and Family:   . Attends Religious Services:   . Active Member of Clubs or Organizations:   . Attends Archivist Meetings:   [name] Marital Status:   Intimate Partner Violence:   . Fear of Current or Ex-Partner:   . Emotionally Abused:   [name] Physically Abused:   . Sexually Abused:     Family History  Problem Relation Age of Onset  . Heart failure Mother   . Stroke Father  Vitals:   11/18/19 1147  BP: (!) 130/72  Pulse: 72  SpO2: 98%  Weight: 87.1 kg (192 lb)    Wt Readings from Last 3 Encounters:  11/18/19 87.1 kg (192  lb)  11/05/19 89.8 kg (198 lb)  10/28/19 85.6 kg (188 lb 12.8 oz)     PHYSICAL EXAM: General:  Well appearing. No resp difficulty HEENT: normal Neck: supple. no JVD. Carotids 2+ bilat; + bruits. No lymphadenopathy or thryomegaly appreciated. Cor: PMI nondisplaced. Regular rate & rhythm. 2/6 AS  Lungs: markedly reduced throughout  No wheeze Abdomen: soft, nontender, nondistended. No hepatosplenomegaly. No bruits or masses. Good bowel sounds. Extremities: no cyanosis, clubbing, rash, edema Neuro: alert & orientedx3, cranial nerves grossly intact. moves all 4 extremities w/o difficulty. Affect pleasant   ASSESSMENT & PLAN:   1. Chronic diastolic CHF with RV failure - Echo 02/02/16 LVEF 60-65%, Grade 3 DD. Mild AI, Mild MR, RV mildly dilated and severely reduced. RA mildly dilated. PA peak pressure 74 mm Hg. Concerns for restrictive cardiomyopathy. - Echo 02/08/16 LVEF 55-60%, Restrictive physiology, (?)RV normal. No assessment of PA pressures. - Echo 7/19 EF 60-65% AVR ok  - TEE 6/21 EF 60% RV moderately HK septal flattening RVSP 90s - Volume status looks good  - Continue torsemide 40 mg qod (decreased due to AKI). - Continue spiro 25 mg daily.   - Reinforced fluid restriction to < 2 L daily, sodium restriction to less than 2000 mg daily, and the importance of daily weights.   - Continues to struggle with RV failure. Will repeat echo in 1-2 months after COPD flare and AF completely behind him. If pulmonary pressures still high -> repeat RHC  2. Paroxysmal Afib s/p AF ablation/Maze s/p pacemaker - Followed by Dr. Rayann Heman 05/20/16. - Recurrent PAF in setting of COPD flare  - TEE-DC-CV on 10/19/19. - Remains in NSR on Tikosyn - Continue Eliquis 5 mg BID. No bleeding   3. AS s/p AVR - Stable bioprosthesis on TEE 6/21. No change to current plan.    4. Pulmonary HTN - suspect pulmonary venous hypertension - RHC in 10/17 with pulmonary venous HTN primarily. No change. - TEE 6/21 EF 60% RV  moderately HK septal flattening RVSP 90s in setting of COPD flare and PAF. Plan as above  5. Mild CAD by cath 01/2016 - Medical management. Continue atorvastatin 40 mg daily - Denies s/s ischemia  6. Chronic respiratory failure with hypoxia - No longer on home O2.  - Follows up with pulmonary.    7. HTN - Blood pressure well controlled. Continue current regimen.  8. DM2 - Consider SGLT2i    Glori Bickers, MD  11/18/19

## 2019-11-18 NOTE — Patient Instructions (Signed)
Your physician recommends that you schedule a follow-up appointment in: 2 months with echocardiogram  If you have any questions or concerns before your next appointment please send [place] a message through [place] or call our office at [phone].    TO LEAVE A MESSAGE FOR THE NURSE SELECT OPTION 2, PLEASE LEAVE A MESSAGE INCLUDING: . YOUR NAME . DATE OF BIRTH . CALL BACK NUMBER . REASON FOR CALL**this is important as we prioritize the call backs  [place] AS LONG AS YOU CALL BEFORE 4:00 PM  At the [facility], you and your health needs are our priority. As part of our continuing mission to provide you with exceptional heart care, we have created designated Provider Care Teams. These Care Teams include your primary Cardiologist (physician) and Advanced Practice Providers (APPs- Physician Assistants and Nurse Practitioners) who all work together to provide you with the care you need, when you need it.   You may see any of the following providers on your designated Care Team at your next follow up: [name] Dr Glori Bickers . Dr Loralie Champagne . Darrick Grinder, NP . Lyda Jester, PA . Audry Riles, PharmD   Please be sure to bring in all your medications bottles to every appointment.

## 2019-11-30 ENCOUNTER — Emergency Department (HOSPITAL_COMMUNITY)
Admission: EM | Admit: 2019-11-30 | Discharge: 2019-12-01 | Disposition: A | Discharge status: Home / Self Care | Payer: Medicare Other | Attending: Emergency Medicine | Admitting: Emergency Medicine

## 2019-11-30 ENCOUNTER — Telehealth: Payer: Self-pay | Admitting: Internal Medicine

## 2019-11-30 ENCOUNTER — Encounter (HOSPITAL_COMMUNITY): Payer: Self-pay | Admitting: Emergency Medicine

## 2019-11-30 ENCOUNTER — Emergency Department (HOSPITAL_COMMUNITY): Payer: Medicare Other

## 2019-11-30 ENCOUNTER — Other Ambulatory Visit: Payer: Self-pay

## 2019-11-30 ENCOUNTER — Telehealth (HOSPITAL_COMMUNITY): Payer: Self-pay | Admitting: *Deleted

## 2019-11-30 DIAGNOSIS — M791 Myalgia, unspecified site: Secondary | ICD-10-CM | POA: Diagnosis present

## 2019-11-30 DIAGNOSIS — R200 Anesthesia of skin: Secondary | ICD-10-CM | POA: Diagnosis not present

## 2019-11-30 DIAGNOSIS — M542 Cervicalgia: Secondary | ICD-10-CM | POA: Diagnosis not present

## 2019-11-30 DIAGNOSIS — Z5321 Procedure and treatment not carried out due to patient leaving prior to being seen by health care provider: Secondary | ICD-10-CM | POA: Diagnosis not present

## 2019-11-30 LAB — CBC WITH DIFFERENTIAL/PLATELET
Abs Immature Granulocytes: 0.06 10*3/uL (ref 0.00–0.07)
Basophils Absolute: 0 10*3/uL (ref 0.0–0.1)
Basophils Relative: 0 %
Eosinophils Absolute: 0 10*3/uL (ref 0.0–0.5)
Eosinophils Relative: 0 %
HCT: 46.6 % (ref 39.0–52.0)
Hemoglobin: 15.1 g/dL (ref 13.0–17.0)
Immature Granulocytes: 1 %
Lymphocytes Relative: 4 %
Lymphs Abs: 0.5 10*3/uL — ABNORMAL LOW (ref 0.7–4.0)
MCH: 30.2 pg (ref 26.0–34.0)
MCHC: 32.4 g/dL (ref 30.0–36.0)
MCV: 93.2 fL (ref 80.0–100.0)
Monocytes Absolute: 0.5 10*3/uL (ref 0.1–1.0)
Monocytes Relative: 4 %
Neutro Abs: 11.9 10*3/uL — ABNORMAL HIGH (ref 1.7–7.7)
Neutrophils Relative %: 91 %
Platelets: 166 10*3/uL (ref 150–400)
RBC: 5 MIL/uL (ref 4.22–5.81)
RDW: 14.6 % (ref 11.5–15.5)
WBC: 13 10*3/uL — ABNORMAL HIGH (ref 4.0–10.5)
nRBC: 0 % (ref 0.0–0.2)

## 2019-11-30 LAB — COMPREHENSIVE METABOLIC PANEL
ALT: 20 U/L (ref 0–44)
AST: 20 U/L (ref 15–41)
Albumin: 4.6 g/dL (ref 3.5–5.0)
Alkaline Phosphatase: 52 U/L (ref 38–126)
Anion gap: 12 (ref 5–15)
BUN: 20 mg/dL (ref 8–23)
CO2: 26 mmol/L (ref 22–32)
Calcium: 10.3 mg/dL (ref 8.9–10.3)
Chloride: 100 mmol/L (ref 98–111)
Creatinine, Ser: 1.53 mg/dL — ABNORMAL HIGH (ref 0.61–1.24)
GFR calc Af Amer: 53 mL/min — ABNORMAL LOW (ref >60)
GFR calc non Af Amer: 45 mL/min — ABNORMAL LOW (ref >60)
Glucose, Bld: 136 mg/dL — ABNORMAL HIGH (ref 70–99)
Potassium: 4.8 mmol/L (ref 3.5–5.1)
Sodium: 138 mmol/L (ref 135–145)
Total Bilirubin: 1.4 mg/dL — ABNORMAL HIGH (ref 0.3–1.2)
Total Protein: 8 g/dL (ref 6.5–8.1)

## 2019-11-30 NOTE — Telephone Encounter (Signed)
Pts wife called stating pt was very cold and his temperature is 92 degrees. Pt is complaining of pain all over and she thinks his heart is out of rhythm.Advised her to take patient to the ED to be evaluated she will also contact Dr.Allred because pt is on tikosyn.

## 2019-11-30 NOTE — Telephone Encounter (Signed)
Noted! Thank you

## 2019-11-30 NOTE — Telephone Encounter (Signed)
New Message:   Wife called and said the [facility] told her to notify Dr Rayann Heman that pt is on his way to the hospital.

## 2019-11-30 NOTE — ED Notes (Signed)
Stated he was leaving AMA.

## 2019-11-30 NOTE — Telephone Encounter (Signed)
    I went in pt's chart to see if message had been sent

## 2019-11-30 NOTE — ED Triage Notes (Signed)
Pt to ED with c/o generalized body aches, neck pain, pain and numbness in right arm.  Pt is on home 02 at 2 LPM via [place]  Pt has not been vaccinated

## 2019-12-06 NOTE — Telephone Encounter (Signed)
Sent application in via fax 8/6. Called to confirm that [employer] received the application.  Application status is pending.  Will follow up.

## 2019-12-10 ENCOUNTER — Encounter (HOSPITAL_COMMUNITY): Payer: Medicare Other

## 2019-12-12 ENCOUNTER — Other Ambulatory Visit: Payer: Self-pay

## 2019-12-12 ENCOUNTER — Encounter (HOSPITAL_BASED_OUTPATIENT_CLINIC_OR_DEPARTMENT_OTHER): Payer: Self-pay

## 2019-12-12 ENCOUNTER — Inpatient Hospital Stay (HOSPITAL_BASED_OUTPATIENT_CLINIC_OR_DEPARTMENT_OTHER)
Admission: EM | Admit: 2019-12-12 | Discharge: 2019-12-14 | DRG: 190 | Disposition: A | Discharge status: Home / Self Care | Payer: Medicare Other | Attending: Internal Medicine | Admitting: Internal Medicine

## 2019-12-12 ENCOUNTER — Emergency Department (HOSPITAL_BASED_OUTPATIENT_CLINIC_OR_DEPARTMENT_OTHER): Payer: Medicare Other

## 2019-12-12 DIAGNOSIS — I340 Nonrheumatic mitral (valve) insufficiency: Secondary | ICD-10-CM | POA: Diagnosis not present

## 2019-12-12 DIAGNOSIS — R0602 Shortness of breath: Secondary | ICD-10-CM | POA: Diagnosis present

## 2019-12-12 DIAGNOSIS — Z823 Family history of stroke: Secondary | ICD-10-CM

## 2019-12-12 DIAGNOSIS — J209 Acute bronchitis, unspecified: Secondary | ICD-10-CM | POA: Diagnosis present

## 2019-12-12 DIAGNOSIS — Z953 Presence of xenogenic heart valve: Secondary | ICD-10-CM

## 2019-12-12 DIAGNOSIS — J440 Chronic obstructive pulmonary disease with acute lower respiratory infection: Secondary | ICD-10-CM | POA: Diagnosis present

## 2019-12-12 DIAGNOSIS — Z20822 Contact with and (suspected) exposure to covid-19: Secondary | ICD-10-CM | POA: Diagnosis present

## 2019-12-12 DIAGNOSIS — Z923 Personal history of irradiation: Secondary | ICD-10-CM

## 2019-12-12 DIAGNOSIS — E1122 Type 2 diabetes mellitus with diabetic chronic kidney disease: Secondary | ICD-10-CM | POA: Diagnosis present

## 2019-12-12 DIAGNOSIS — Z8249 Family history of ischemic heart disease and other diseases of the circulatory system: Secondary | ICD-10-CM

## 2019-12-12 DIAGNOSIS — I2510 Atherosclerotic heart disease of native coronary artery without angina pectoris: Secondary | ICD-10-CM | POA: Diagnosis present

## 2019-12-12 DIAGNOSIS — J208 Acute bronchitis due to other specified organisms: Secondary | ICD-10-CM | POA: Diagnosis not present

## 2019-12-12 DIAGNOSIS — N1832 Chronic kidney disease, stage 3b: Secondary | ICD-10-CM | POA: Diagnosis present

## 2019-12-12 DIAGNOSIS — E039 Hypothyroidism, unspecified: Secondary | ICD-10-CM | POA: Diagnosis present

## 2019-12-12 DIAGNOSIS — I361 Nonrheumatic tricuspid (valve) insufficiency: Secondary | ICD-10-CM | POA: Diagnosis not present

## 2019-12-12 DIAGNOSIS — I441 Atrioventricular block, second degree: Secondary | ICD-10-CM | POA: Diagnosis present

## 2019-12-12 DIAGNOSIS — B9689 Other specified bacterial agents as the cause of diseases classified elsewhere: Secondary | ICD-10-CM | POA: Diagnosis present

## 2019-12-12 DIAGNOSIS — J9621 Acute and chronic respiratory failure with hypoxia: Secondary | ICD-10-CM | POA: Diagnosis present

## 2019-12-12 DIAGNOSIS — I272 Pulmonary hypertension, unspecified: Secondary | ICD-10-CM | POA: Diagnosis not present

## 2019-12-12 DIAGNOSIS — K219 Gastro-esophageal reflux disease without esophagitis: Secondary | ICD-10-CM

## 2019-12-12 DIAGNOSIS — N183 Chronic kidney disease, stage 3 unspecified: Secondary | ICD-10-CM | POA: Diagnosis not present

## 2019-12-12 DIAGNOSIS — Z9981 Dependence on supplemental oxygen: Secondary | ICD-10-CM

## 2019-12-12 DIAGNOSIS — J441 Chronic obstructive pulmonary disease with (acute) exacerbation: Secondary | ICD-10-CM | POA: Diagnosis present

## 2019-12-12 DIAGNOSIS — I2729 Other secondary pulmonary hypertension: Secondary | ICD-10-CM | POA: Diagnosis present

## 2019-12-12 DIAGNOSIS — M2550 Pain in unspecified joint: Secondary | ICD-10-CM | POA: Diagnosis present

## 2019-12-12 DIAGNOSIS — R1084 Generalized abdominal pain: Secondary | ICD-10-CM

## 2019-12-12 DIAGNOSIS — G4733 Obstructive sleep apnea (adult) (pediatric): Secondary | ICD-10-CM | POA: Diagnosis present

## 2019-12-12 DIAGNOSIS — M19041 Primary osteoarthritis, right hand: Secondary | ICD-10-CM | POA: Diagnosis present

## 2019-12-12 DIAGNOSIS — I5082 Biventricular heart failure: Secondary | ICD-10-CM | POA: Diagnosis present

## 2019-12-12 DIAGNOSIS — E872 Acidosis: Secondary | ICD-10-CM | POA: Diagnosis present

## 2019-12-12 DIAGNOSIS — I071 Rheumatic tricuspid insufficiency: Secondary | ICD-10-CM | POA: Diagnosis present

## 2019-12-12 DIAGNOSIS — E785 Hyperlipidemia, unspecified: Secondary | ICD-10-CM | POA: Diagnosis present

## 2019-12-12 DIAGNOSIS — I130 Hypertensive heart and chronic kidney disease with heart failure and stage 1 through stage 4 chronic kidney disease, or unspecified chronic kidney disease: Secondary | ICD-10-CM | POA: Diagnosis present

## 2019-12-12 DIAGNOSIS — R112 Nausea with vomiting, unspecified: Secondary | ICD-10-CM

## 2019-12-12 DIAGNOSIS — E119 Type 2 diabetes mellitus without complications: Secondary | ICD-10-CM | POA: Insufficient documentation

## 2019-12-12 DIAGNOSIS — G2581 Restless legs syndrome: Secondary | ICD-10-CM | POA: Diagnosis present

## 2019-12-12 DIAGNOSIS — M19042 Primary osteoarthritis, left hand: Secondary | ICD-10-CM | POA: Diagnosis present

## 2019-12-12 DIAGNOSIS — Z87891 Personal history of nicotine dependence: Secondary | ICD-10-CM

## 2019-12-12 DIAGNOSIS — M797 Fibromyalgia: Secondary | ICD-10-CM | POA: Diagnosis present

## 2019-12-12 DIAGNOSIS — I5032 Chronic diastolic (congestive) heart failure: Secondary | ICD-10-CM

## 2019-12-12 DIAGNOSIS — Z950 Presence of cardiac pacemaker: Secondary | ICD-10-CM

## 2019-12-12 DIAGNOSIS — I480 Paroxysmal atrial fibrillation: Secondary | ICD-10-CM | POA: Diagnosis present

## 2019-12-12 DIAGNOSIS — R7989 Other specified abnormal findings of blood chemistry: Secondary | ICD-10-CM | POA: Diagnosis present

## 2019-12-12 DIAGNOSIS — J9620 Acute and chronic respiratory failure, unspecified whether with hypoxia or hypercapnia: Secondary | ICD-10-CM | POA: Diagnosis not present

## 2019-12-12 DIAGNOSIS — Z794 Long term (current) use of insulin: Secondary | ICD-10-CM | POA: Diagnosis not present

## 2019-12-12 DIAGNOSIS — Z7901 Long term (current) use of anticoagulants: Secondary | ICD-10-CM

## 2019-12-12 LAB — COMPREHENSIVE METABOLIC PANEL
ALT: 25 U/L (ref 0–44)
AST: 24 U/L (ref 15–41)
Albumin: 3.9 g/dL (ref 3.5–5.0)
Alkaline Phosphatase: 45 U/L (ref 38–126)
Anion gap: 11 (ref 5–15)
BUN: 13 mg/dL (ref 8–23)
CO2: 27 mmol/L (ref 22–32)
Calcium: 9.5 mg/dL (ref 8.9–10.3)
Chloride: 99 mmol/L (ref 98–111)
Creatinine, Ser: 1.15 mg/dL (ref 0.61–1.24)
GFR calc Af Amer: >60 mL/min (ref >60)
GFR calc non Af Amer: >60 mL/min (ref >60)
Glucose, Bld: 144 mg/dL — ABNORMAL HIGH (ref 70–99)
Potassium: 4.7 mmol/L (ref 3.5–5.1)
Sodium: 137 mmol/L (ref 135–145)
Total Bilirubin: 1.2 mg/dL (ref 0.3–1.2)
Total Protein: 7.2 g/dL (ref 6.5–8.1)

## 2019-12-12 LAB — URINALYSIS, ROUTINE W REFLEX MICROSCOPIC
Bilirubin Urine: NEGATIVE
Glucose, UA: 100 mg/dL — AB
Hgb urine dipstick: NEGATIVE
Ketones, ur: NEGATIVE mg/dL
Leukocytes,Ua: NEGATIVE
Nitrite: NEGATIVE
Protein, ur: 100 mg/dL — AB
Specific Gravity, Urine: 1.02 (ref 1.005–1.030)
pH: 7 (ref 5.0–8.0)

## 2019-12-12 LAB — LACTIC ACID, PLASMA
Lactic Acid, Venous: 2.5 mmol/L (ref 0.5–1.9)
Lactic Acid, Venous: 2.6 mmol/L (ref 0.5–1.9)

## 2019-12-12 LAB — CBC
HCT: 41 % (ref 39.0–52.0)
Hemoglobin: 13.3 g/dL (ref 13.0–17.0)
MCH: 30.6 pg (ref 26.0–34.0)
MCHC: 32.4 g/dL (ref 30.0–36.0)
MCV: 94.5 fL (ref 80.0–100.0)
Platelets: 136 10*3/uL — ABNORMAL LOW (ref 150–400)
RBC: 4.34 MIL/uL (ref 4.22–5.81)
RDW: 14.3 % (ref 11.5–15.5)
WBC: 11.8 10*3/uL — ABNORMAL HIGH (ref 4.0–10.5)
nRBC: 0 % (ref 0.0–0.2)

## 2019-12-12 LAB — URINALYSIS, MICROSCOPIC (REFLEX)
Bacteria, UA: NONE SEEN
Squamous Epithelial / HPF: NONE SEEN (ref 0–5)
WBC, UA: NONE SEEN WBC/hpf (ref 0–5)

## 2019-12-12 LAB — TROPONIN I (HIGH SENSITIVITY): Troponin I (High Sensitivity): 27 ng/L — ABNORMAL HIGH (ref <18)

## 2019-12-12 LAB — CBG MONITORING, ED: Glucose-Capillary: 179 mg/dL — ABNORMAL HIGH (ref 70–99)

## 2019-12-12 LAB — LIPASE, BLOOD: Lipase: 21 U/L (ref 11–51)

## 2019-12-12 LAB — SARS CORONAVIRUS 2 BY RT PCR (HOSPITAL ORDER, PERFORMED IN ~~LOC~~ HOSPITAL LAB): SARS Coronavirus 2: NEGATIVE

## 2019-12-12 MED ORDER — ONDANSETRON 4 MG PO TBDP: 4.0000 mg | ORAL_TABLET | Freq: Three times a day (TID) | ORAL | 0 refills | Status: DC | PRN

## 2019-12-12 MED ORDER — DEXAMETHASONE SODIUM PHOSPHATE 10 MG/ML IJ SOLN
10.0000 mg | Freq: Once | INTRAMUSCULAR | Status: AC
  Administered 2019-12-12: 10 mg via INTRAVENOUS
  Filled 2019-12-12: qty 1

## 2019-12-12 MED ORDER — MORPHINE SULFATE (PF) 4 MG/ML IV SOLN
4.0000 mg | Freq: Once | INTRAVENOUS | Status: AC
  Administered 2019-12-12: 4 mg via INTRAVENOUS
  Filled 2019-12-12: qty 1

## 2019-12-12 MED ORDER — SODIUM CHLORIDE 0.9 % IV SOLN
2.0000 g | Freq: Three times a day (TID) | INTRAVENOUS | Status: DC
  Administered 2019-12-12 (×2): 2 g via INTRAVENOUS
  Filled 2019-12-12 (×2): qty 2

## 2019-12-12 MED ORDER — ROPINIROLE HCL 1 MG PO TABS
4.0000 mg | ORAL_TABLET | Freq: Every day | ORAL | Status: DC
  Administered 2019-12-12 – 2019-12-13 (×2): 4 mg via ORAL
  Filled 2019-12-12 (×2): qty 4

## 2019-12-12 MED ORDER — IPRATROPIUM-ALBUTEROL 0.5-2.5 (3) MG/3ML IN SOLN
3.0000 mL | RESPIRATORY_TRACT | Status: DC | PRN
  Administered 2019-12-13: 3 mL via RESPIRATORY_TRACT
  Filled 2019-12-12 (×2): qty 3

## 2019-12-12 MED ORDER — ONDANSETRON HCL 4 MG/2ML IJ SOLN
4.0000 mg | Freq: Once | INTRAMUSCULAR | Status: AC | PRN
  Administered 2019-12-12: 4 mg via INTRAVENOUS
  Filled 2019-12-12: qty 2

## 2019-12-12 MED ORDER — LACTATED RINGERS IV BOLUS
1000.0000 mL | Freq: Once | INTRAVENOUS | Status: AC
  Administered 2019-12-12: 1000 mL via INTRAVENOUS

## 2019-12-12 MED ORDER — SODIUM CHLORIDE 0.9 % IV SOLN
2.0000 g | Freq: Once | INTRAVENOUS | Status: AC
  Administered 2019-12-12: 2 g via INTRAVENOUS
  Filled 2019-12-12: qty 2

## 2019-12-12 MED ORDER — METRONIDAZOLE IN NACL 5-0.79 MG/ML-% IV SOLN
500.0000 mg | Freq: Once | INTRAVENOUS | Status: AC
  Administered 2019-12-12: 500 mg via INTRAVENOUS
  Filled 2019-12-12: qty 100

## 2019-12-12 MED ORDER — SODIUM CHLORIDE 0.9 % IV SOLN
INTRAVENOUS | Status: DC | PRN
  Administered 2019-12-12: 250 mL via INTRAVENOUS

## 2019-12-12 MED ORDER — LACTATED RINGERS IV SOLN: INTRAVENOUS | Status: DC

## 2019-12-12 MED ORDER — HYDROCODONE-ACETAMINOPHEN 5-325 MG PO TABS
1.0000 | ORAL_TABLET | Freq: Once | ORAL | Status: AC
  Administered 2019-12-12: 1 via ORAL
  Filled 2019-12-12: qty 1

## 2019-12-12 MED ORDER — ACETAMINOPHEN 500 MG PO TABS
1000.0000 mg | ORAL_TABLET | Freq: Once | ORAL | Status: AC
  Administered 2019-12-12: 1000 mg via ORAL
  Filled 2019-12-12: qty 2

## 2019-12-12 MED ORDER — HYDROCODONE-ACETAMINOPHEN 5-325 MG PO TABS
1.0000 | ORAL_TABLET | Freq: Four times a day (QID) | ORAL | Status: DC | PRN
  Administered 2019-12-12: 1 via ORAL
  Filled 2019-12-12: qty 1

## 2019-12-12 MED ORDER — ALBUTEROL SULFATE HFA 108 (90 BASE) MCG/ACT IN AERS
4.0000 | INHALATION_SPRAY | Freq: Once | RESPIRATORY_TRACT | Status: AC
  Administered 2019-12-12: 4 via RESPIRATORY_TRACT

## 2019-12-12 MED ORDER — IPRATROPIUM-ALBUTEROL 0.5-2.5 (3) MG/3ML IN SOLN
3.0000 mL | Freq: Once | RESPIRATORY_TRACT | Status: AC
  Administered 2019-12-12: 3 mL via RESPIRATORY_TRACT
  Filled 2019-12-12: qty 3

## 2019-12-12 NOTE — Discharge Instructions (Addendum)
He was seen in the emergency room today with abdominal discomfort, vomiting, shortness of breath.  Her Covid test and lab work is all reassuring.  I do not see a clear source of infection at this time.  He develop worsening abdominal pain, shortness of breath or develop chest pain he should return to the emergency department immediately for evaluation.

## 2019-12-12 NOTE — ED Notes (Signed)
[facility] notified (Jaime) - patient ready for transport 

## 2019-12-12 NOTE — ED Provider Notes (Signed)
Medical Decision Making: Care of patient assumed from Dr. Laverta Baltimore at 0700.  Agree with history, physical exam and plan.  See their note for further details.  Briefly, The pt p/w SOB, ab pain, n/v with reassuring exam and labs so far.   Current plan is as follows: lactic, BC, Trop and PO challenge.  Unclear origin of lactic acidosis but continued to rise to 2.6.  Additional lactated Ringer's was given.  Patient has received antibiotics.  Troponin is elevated but previously elevated levels.  Patient remained stable here, the family and the patient and I would feel more comfortable if he was observed watch blood cultures to make sure symptoms continue to resolve.  Possibly this is all related to COPD exacerbation.  He has received steroids and he has received breathing treatments and antibiotics.  The hospitalist was consulted and accepts this patient for admission.  I personally reviewed and interpreted all labs/imaging.      Breck Coons, MD 12/12/19 1013

## 2019-12-12 NOTE — ED Provider Notes (Signed)
Emergency Department Provider Note   I have reviewed the triage vital signs and the nursing notes.   HISTORY  Chief Complaint Emesis   HPI [name] is a 69 y.o. male with past medical history reviewed below presents to the emergency department with abdominal pain, nausea/vomiting/diarrhea, shortness of breath, chills.  Symptoms began yesterday morning and have progressively worsened throughout the day.  Patient is on 2 L nasal cannula at home at baseline.  He denies any associated chest pain.  No cough or fever but has had notable chills.  His abdominal pain is mainly periumbilical.  He is having bowel movements and passing flatus.  No blood in the emesis or stool.  No radiation of symptoms or other modifying factors.  No known sick contacts.    Past Medical History:  Diagnosis Date  . Aortic stenosis, mild   . Arthritis of hand    "just a little bit in both hands" (03/31/2012)  . Asthma    "little bit" (03/31/2012)  . Atrial fibrillation [facility])    dx '04; DCCV '04, placed on flecainide, failed DCCV 04/2010, flecainide stopped; s/p successful A.fib ablation 01/31/12  . CHF (congestive heart failure) ([place])   . Controlled type 2 diabetes mellitus without complication, without Jamica Woodyard-term current use of insulin ([place]) 12/13/2019  . COPD (chronic obstructive pulmonary disease) ([place])   . Diabetes mellitus without complication (HCC)    borderline  . DJD (degenerative joint disease)   . Dyspnea   . Exertional dyspnea   . Fibromyalgia   . First degree AV block    post ablation, ~463ms  . GERD (gastroesophageal reflux disease)   . Heart murmur   . Hyperlipidemia   . Hypertension   . Hypothyroidism    S/P radiation  . Left atrial enlargement    LA size 52mm by echo 11/21/11  . Mitral regurgitation    trivial  . Nausea 12/13/2019  . Obesity   . Obstructive sleep apnea    mild by sleep study 2013; pt stated he does not have a machine because "it wasn't bad enough for him to have  one"  . Pacemaker 03/31/2012  . Restless leg syndrome   . Second degree AV block   . Splenomegaly   . Visit for monitoring Tikosyn therapy 04/16/2016    Patient Active Problem List   Diagnosis Date Noted  . Hypothyroidism 12/13/2019  . Controlled type 2 diabetes mellitus with stage 3 chronic kidney disease, without Shenelle Klas-term current use of insulin ([place]) 12/13/2019  . Chronic kidney disease, stage 3b 12/13/2019  . Acute bacterial bronchitis 12/13/2019  . COPD exacerbation ([place]) 12/13/2019  . COPD with acute exacerbation ([place]) 12/12/2019  . COPD (chronic obstructive pulmonary disease) ([place]) 10/16/2019  . Typical atrial flutter ([place])   . Chronic respiratory failure with hypoxia ([place]) 05/23/2016  . CAD (coronary artery disease) 05/23/2016  . Persistent atrial fibrillation ([place])   . Atypical atrial flutter ([place])   . Visit for monitoring Tikosyn therapy 04/16/2016  . Chronic diastolic CHF (congestive heart failure) ([place]) 02/03/2016  . Cough 12/06/2015  . Hallucinations 09/04/2015  . Lymphadenopathy 09/04/2015  . Ascending aortic aneurysm ([place]) 09/04/2015  . Hypoxia 09/02/2015  . S/P aortic valve replacement with bioprosthetic valve 09/02/2015  . Pleural effusion 09/02/2015  . S/P AVR 08/22/2015  . GERD without esophagitis 09/20/2014  . 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 Dyspnea 09/15/2013  . Pulmonary hypertension ([place]) 09/15/2013  . Shortness of breath 05/05/2013  . 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  Bradycardia 03/31/2012  . Second degree AV block 03/31/2012  . AV block, 1st degree 02/01/2012  . PAF (paroxysmal atrial fibrillation) ([place]) 11/13/2011  . Fatigue 11/13/2011  . Hypertension 11/13/2011  . Aortic stenosis 11/13/2011  . Sleep apnea 11/13/2011    Past Surgical History:  Procedure Laterality Date  . AORTIC VALVE REPLACEMENT N/A 08/22/2015   Procedure: AORTIC VALVE REPLACEMENT (AVR);  Surgeon: Ivin Poot, MD;  Location: [place];  Service: Open Heart Surgery;   Laterality: N/A;  . ATRIAL FIBRILLATION ABLATION  01/30/2012   PVI by Dr. Rayann Heman  . ATRIAL FIBRILLATION ABLATION N/A 01/31/2012   Procedure: ATRIAL FIBRILLATION ABLATION;  Surgeon: Thompson Grayer, MD;  Location: [facility] CATH LAB;  Service: Cardiovascular;  Laterality: N/A;  . BACK SURGERY     X 3  . CARDIAC CATHETERIZATION N/A 08/09/2015   Procedure: Right/Left Heart Cath and Coronary Angiography;  Surgeon: Peter M Martinique, MD;  Location: [place] CV LAB;  Service: Cardiovascular;  Laterality: N/A;  . CARDIAC CATHETERIZATION N/A 02/05/2016   Procedure: Right/Left Heart Cath and Coronary Angiography;  Surgeon: Jettie Booze, MD;  Location: [place] CV LAB;  Service: Cardiovascular;  Laterality: N/A;  . CARDIAC CATHETERIZATION N/A 04/17/2016   Procedure: Right Heart Cath;  Surgeon: Jolaine Artist, MD;  Location: [place] CV LAB;  Service: Cardiovascular;  Laterality: N/A;  . CARDIOVASCULAR STRESS TEST  03/12/2002   EF 48%, NO EVIDENCE OF ISCHEMIA  . CARDIOVERSION  01/2012; 03/31/2012  . CARDIOVERSION N/A 02/25/2012   Procedure: CARDIOVERSION;  Surgeon: Thompson Grayer, MD;  Location: [facility] CATH LAB;  Service: Cardiovascular;  Laterality: N/A;  . CARDIOVERSION N/A 03/31/2012   Procedure: CARDIOVERSION;  Surgeon: Thompson Grayer, MD;  Location: [facility] CATH LAB;  Service: Cardiovascular;  Laterality: N/A;  . CARDIOVERSION N/A 11/23/2015   Procedure: CARDIOVERSION;  Surgeon: Larey Dresser, MD;  Location: [place];  Service: Cardiovascular;  Laterality: N/A;  . CARDIOVERSION N/A 04/08/2016   Procedure: CARDIOVERSION;  Surgeon: Jolaine Artist, MD;  Location: [facility] ENDOSCOPY;  Service: Cardiovascular;  Laterality: N/A;  . CARDIOVERSION N/A 09/14/2018   Procedure: CARDIOVERSION;  Surgeon: Josue Hector, MD;  Location: [facility] ENDOSCOPY;  Service: Cardiovascular;  Laterality: N/A;  . CARDIOVERSION N/A 10/19/2019   Procedure: CARDIOVERSION;  Surgeon: Jolaine Artist, MD;  Location: [place];   Service: Cardiovascular;  Laterality: N/A;  . CLIPPING OF ATRIAL APPENDAGE N/A 08/22/2015   Procedure: CLIPPING OF ATRIAL APPENDAGE;  Surgeon: Ivin Poot, MD;  Location: [place];  Service: Open Heart Surgery;  Laterality: N/A;  . DOPPLER ECHOCARDIOGRAPHY  03/11/2002   EF 70-75%  . FINGER SURGERY Left    Middle finger  . FINGER TENDON REPAIR  1980's   "right little finger" (03/31/2012)  . INSERT / REPLACE / REMOVE PACEMAKER     St Jude  . IR RADIOLOGIST EVAL & MGMT  05/06/2019  . IR RADIOLOGIST EVAL & MGMT  10/06/2019  . LEFT AND RIGHT HEART CATHETERIZATION WITH CORONARY ANGIOGRAM N/A 10/27/2012   Procedure: LEFT AND RIGHT HEART CATHETERIZATION WITH CORONARY ANGIOGRAM;  Surgeon: Laverda Page, MD;  Location: [facility] CATH LAB;  Service: Cardiovascular;  Laterality: N/A;  . LUMBAR DISC SURGERY  1980's X2;  2000's  . MAZE N/A 08/22/2015   Procedure: MAZE;  Surgeon: Ivin Poot, MD;  Location: [place];  Service: Open Heart Surgery;  Laterality: N/A;  . PACEMAKER INSERTION  03/31/2012   STJ Accent DR pacemaker implanted by Dr Rayann Heman  . PERMANENT PACEMAKER  INSERTION N/A 03/31/2012   Procedure: PERMANENT PACEMAKER INSERTION;  Surgeon: Thompson Grayer, MD;  Location: [facility] CATH LAB;  Service: Cardiovascular;  Laterality: N/A;  . TEE WITHOUT CARDIOVERSION  01/30/2012   Procedure: TRANSESOPHAGEAL ECHOCARDIOGRAM (TEE);  Surgeon: Larey Dresser, MD;  Location: [place];  Service: Cardiovascular;  Laterality: N/A;  ablation next day  . TEE WITHOUT CARDIOVERSION N/A 08/22/2015   Procedure: TRANSESOPHAGEAL ECHOCARDIOGRAM (TEE);  Surgeon: Ivin Poot, MD;  Location: [place];  Service: Open Heart Surgery;  Laterality: N/A;  . TEE WITHOUT CARDIOVERSION N/A 10/19/2019   Procedure: TRANSESOPHAGEAL ECHOCARDIOGRAM (TEE);  Surgeon: Jolaine Artist, MD;  Location: [facility] ENDOSCOPY;  Service: Cardiovascular;  Laterality: N/A;  . US ECHOCARDIOGRAPHY  08/07/2009   EF 55-60%    Allergies Meloxicam and Vancomycin  Family  History  Problem Relation Age of Onset  . Heart failure Mother   . Stroke Father     Social History Social History   Tobacco Use  . Smoking status: Former Smoker    Packs/day: 0.50    Years: 15.00    Pack years: 7.50    Types: Cigarettes    Quit date: 07/18/1991    Years since quitting: 28.4  . Smokeless tobacco: Never Used  Vaping Use  . Vaping Use: Never used  Substance Use Topics  . Alcohol use: Yes    Comment: occasional  . Drug use: Yes    Types: Marijuana    Comment: 02/2019    Review of Systems  Constitutional: No fever. Positive chills.  Eyes: No visual changes. ENT: No sore throat. Cardiovascular: Denies chest pain. Respiratory: Positive shortness of breath. Gastrointestinal: Positive abdominal pain. Positive nausea, vomiting, and diarrhea.  No constipation. Genitourinary: Negative for dysuria. Musculoskeletal: Negative for back pain. Skin: Negative for rash. Neurological: Negative for headaches, focal weakness or numbness.  10-point ROS otherwise negative.  ____________________________________________   PHYSICAL EXAM:  VITAL SIGNS: ED Triage Vitals  Enc Vitals Group     BP 12/12/19 0113 (!) 143/75     Pulse Rate 12/12/19 0113 78     Resp 12/12/19 0113 (!) 24     Temp 12/12/19 0113 98.8 F (37.1 C)     Temp Source 12/12/19 0113 Oral     SpO2 12/12/19 0113 100 %     Weight 12/12/19 0122 190 lb (86.2 kg)     Height 12/12/19 0122 5\' 11"  (1.803 m)   Constitutional: Alert and oriented. Well appearing and in no acute distress. Eyes: Conjunctivae are normal. Head: Atraumatic. Nose: No congestion/rhinnorhea. Mouth/Throat: Mucous membranes are moist. Neck: No stridor.   Cardiovascular: Normal rate, regular rhythm. Good peripheral circulation. Grossly normal heart sounds.   Respiratory: Increased respiratory effort.  No retractions. Lungs CTAB. Gastrointestinal: Soft with mild diffuse tenderness. Umbilical hernia is soft. No clinical signs of  incarceration. Mild distention.  Musculoskeletal: No lower extremity tenderness nor edema. No gross deformities of extremities. Neurologic:  Normal speech and language. No gross focal neurologic deficits are appreciated.  Skin:  Skin is warm, dry and intact. No rash noted.  ____________________________________________   LABS (all labs ordered are listed, but only abnormal results are displayed)  Labs Reviewed  URINE CULTURE - Abnormal; Notable for the following components:      Result Value   Culture 10,000 COLONIES/mL ESCHERICHIA COLI (*)    Organism ID, Bacteria ESCHERICHIA COLI (*)    All other components within normal limits  COMPREHENSIVE METABOLIC PANEL - Abnormal; Notable for the following components:   Glucose,  Bld 144 (*)    All other components within normal limits  CBC - Abnormal; Notable for the following components:   WBC 11.8 (*)    Platelets 136 (*)    All other components within normal limits  URINALYSIS, ROUTINE W REFLEX MICROSCOPIC - Abnormal; Notable for the following components:   Glucose, UA 100 (*)    Protein, ur 100 (*)    All other components within normal limits  LACTIC ACID, PLASMA - Abnormal; Notable for the following components:   Lactic Acid, Venous 2.5 (*)    All other components within normal limits  LACTIC ACID, PLASMA - Abnormal; Notable for the following components:   Lactic Acid, Venous 2.6 (*)    All other components within normal limits  BRAIN NATRIURETIC PEPTIDE - Abnormal; Notable for the following components:   B Natriuretic Peptide 611.6 (*)    All other components within normal limits  BASIC METABOLIC PANEL - Abnormal; Notable for the following components:   Glucose, Bld 172 (*)    All other components within normal limits  GLUCOSE, CAPILLARY - Abnormal; Notable for the following components:   Glucose-Capillary 145 (*)    All other components within normal limits  CBC WITH DIFFERENTIAL/PLATELET - Abnormal; Notable for the following  components:   WBC 12.8 (*)    RBC 4.09 (*)    Hemoglobin 12.8 (*)    Platelets 142 (*)    Neutro Abs 12.3 (*)    Lymphs Abs 0.2 (*)    All other components within normal limits  LACTIC ACID, PLASMA - Abnormal; Notable for the following components:   Lactic Acid, Venous 2.8 (*)    All other components within normal limits  LACTIC ACID, PLASMA - Abnormal; Notable for the following components:   Lactic Acid, Venous 3.9 (*)    All other components within normal limits  GLUCOSE, CAPILLARY - Abnormal; Notable for the following components:   Glucose-Capillary 174 (*)    All other components within normal limits  GLUCOSE, CAPILLARY - Abnormal; Notable for the following components:   Glucose-Capillary 183 (*)    All other components within normal limits  CBC WITH DIFFERENTIAL/PLATELET - Abnormal; Notable for the following components:   WBC 11.3 (*)    RBC 3.94 (*)    Hemoglobin 11.9 (*)    HCT 36.6 (*)    Platelets 137 (*)    Neutro Abs 10.5 (*)    Lymphs Abs 0.3 (*)    Abs Immature Granulocytes 0.12 (*)    All other components within normal limits  COMPREHENSIVE METABOLIC PANEL - Abnormal; Notable for the following components:   Chloride 97 (*)    Glucose, Bld 166 (*)    BUN 35 (*)    Creatinine, Ser 1.26 (*)    Total Protein 6.1 (*)    Albumin 3.1 (*)    Alkaline Phosphatase 36 (*)    GFR calc non Af Amer 57 (*)    All other components within normal limits  GLUCOSE, CAPILLARY - Abnormal; Notable for the following components:   Glucose-Capillary 169 (*)    All other components within normal limits  GLUCOSE, CAPILLARY - Abnormal; Notable for the following components:   Glucose-Capillary 191 (*)    All other components within normal limits  GLUCOSE, CAPILLARY - Abnormal; Notable for the following components:   Glucose-Capillary 196 (*)    All other components within normal limits  LACTIC ACID, PLASMA - Abnormal; Notable for the following components:   Lactic Acid,  Venous 2.6  (*)    All other components within normal limits  GLUCOSE, CAPILLARY - Abnormal; Notable for the following components:   Glucose-Capillary 155 (*)    All other components within normal limits  CBG MONITORING, ED - Abnormal; Notable for the following components:   Glucose-Capillary 179 (*)    All other components within normal limits  TROPONIN I (HIGH SENSITIVITY) - Abnormal; Notable for the following components:   Troponin I (High Sensitivity) 27 (*)    All other components within normal limits  SARS CORONAVIRUS 2 BY RT PCR (HOSPITAL ORDER, [place] LAB)  CULTURE, BLOOD (ROUTINE X 2)  CULTURE, BLOOD (ROUTINE X 2)  EXPECTORATED SPUTUM ASSESSMENT W REFEX TO RESP CULTURE  LIPASE, BLOOD  URINALYSIS, MICROSCOPIC (REFLEX)  HIV ANTIBODY (ROUTINE TESTING W REFLEX)  MAGNESIUM  PROCALCITONIN  MAGNESIUM  PHOSPHORUS  PROCALCITONIN  LACTIC ACID, PLASMA   ____________________________________________  EKG   EKG Interpretation  Date/Time:  Sunday December 12 2019 01:17:02 EDT Ventricular Rate:  78 PR Interval:  162 QRS Duration: 178 QT Interval:  456 QTC Calculation: 519 R Axis:   -60 Text Interpretation: Atrial-sensed ventricular-paced rhythm Abnormal ECG Confirmed by Nanda Quinton [phone]) on 12/12/2019 2:24:42 AM       ____________________________________________  RADIOLOGY  CXR and CT abdomen/pelvis reviewed.  ____________________________________________   PROCEDURES  Procedure(s) performed:   Procedures  CRITICAL CARE Performed by: Margette Fast Total critical care time: 35 minutes Critical care time was exclusive of separately billable procedures and treating other patients. Critical care was necessary to treat or prevent imminent or life-threatening deterioration. Critical care was time spent personally by me on the following activities: development of treatment plan with patient and/or surrogate as well as nursing, discussions with  consultants, evaluation of patient's response to treatment, examination of patient, obtaining history from patient or surrogate, ordering and performing treatments and interventions, ordering and review of laboratory studies, ordering and review of radiographic studies, pulse oximetry and re-evaluation of patient's condition.  Nanda Quinton, MD Emergency Medicine  ____________________________________________   INITIAL IMPRESSION / ASSESSMENT AND PLAN / ED COURSE  Pertinent labs & imaging results that were available during my care of the patient were reviewed by me and considered in my medical decision making (see chart for details).   Patient presents to the emergency department with chills, shortness of breath, abdominal pain, nausea, vomiting, diarrhea.  Plan for Covid testing along with CT abdomen pelvis, chest x-ray.  Labs from triage are unremarkable.  EKG shows atrial paced rhythm similar to his prior tracings in comparison.  Denies any chest pain.  Doubt atypical ACS or PE clinically.  He is on his home oxygen at this time.   Labs reviewed. Patient with borderline fever here. No clear infection source. Lactate elevated. Concern for early sepsis. Will admit on abx and follow cultures.  ____________________________________________  FINAL CLINICAL IMPRESSION(S) / ED DIAGNOSES  Final diagnoses:  Non-intractable vomiting with nausea, unspecified vomiting type  Generalized abdominal pain  SOB (shortness of breath)     MEDICATIONS GIVEN DURING THIS VISIT:  Medications  0.9 %  sodium chloride infusion ( Intravenous Stopped 12/12/19 1055)  HYDROcodone-acetaminophen (NORCO/VICODIN) 5-325 MG per tablet 1 tablet (1 tablet Oral Given 12/12/19 2318)  ipratropium-albuterol (DUONEB) 0.5-2.5 (3) MG/3ML nebulizer solution 3 mL (3 mLs Nebulization Given 12/13/19 0215)  rOPINIRole (REQUIP) tablet 4 mg (4 mg Oral Given 12/13/19 2239)  ondansetron (ZOFRAN) injection 4 mg (4 mg Intravenous Given 12/13/19  0036)  amLODipine (NORVASC) tablet 5 mg (5 mg Oral Given 12/14/19 0844)  bisoprolol (ZEBETA) tablet 5 mg (5 mg Oral Given 12/14/19 0844)  dofetilide (TIKOSYN) capsule 250 mcg (250 mcg Oral Given 12/14/19 0844)  rosuvastatin (CRESTOR) tablet 10 mg (10 mg Oral Given 12/13/19 2239)  spironolactone (ALDACTONE) tablet 25 mg (25 mg Oral Given 12/14/19 0844)  torsemide (DEMADEX) tablet 40 mg (40 mg Oral Given 12/13/19 0850)  methylPREDNISolone sodium succinate (SOLU-MEDROL) 125 mg/2 mL injection 60 mg (60 mg Intravenous Given 12/14/19 0844)  levothyroxine (SYNTHROID) tablet 175 mcg (175 mcg Oral Given 12/14/19 0509)  pantoprazole (PROTONIX) EC tablet 40 mg (40 mg Oral Given 12/14/19 0843)  apixaban (ELIQUIS) tablet 5 mg (5 mg Oral Given 12/14/19 0844)  traMADol-acetaminophen (ULTRACET) 37.5-325 MG per tablet 2 tablet (2 tablets Oral Given 12/14/19 0844)  insulin aspart (novoLOG) injection 0-15 Units (3 Units Subcutaneous Given 12/14/19 1202)  gabapentin (NEURONTIN) capsule 600 mg (600 mg Oral Given 12/14/19 1157)    And  gabapentin (NEURONTIN) capsule 1,200 mg (1,200 mg Oral Given 12/13/19 2239)  doxycycline (VIBRAMYCIN) 100 mg in sodium chloride 0.9 % 250 mL IVPB (100 mg Intravenous New Bag/Given 12/14/19 0849)  protein supplement (ENSURE MAX) liquid (11 oz Oral Given 12/13/19 2240)  multivitamin with minerals tablet 1 tablet (1 tablet Oral Given 12/14/19 0843)  0.9 %  sodium chloride infusion ( Intravenous New Bag/Given 12/14/19 0343)  guaiFENesin (MUCINEX) 12 hr tablet 1,200 mg (1,200 mg Oral Given 12/14/19 1057)  albuterol (VENTOLIN HFA) 108 (90 Base) MCG/ACT inhaler 4 puff (4 puffs Inhalation Given by Other 12/12/19 0121)  ondansetron (ZOFRAN) injection 4 mg (4 mg Intravenous Given 12/12/19 0135)  morphine 4 MG/ML injection 4 mg (4 mg Intravenous Given 12/12/19 0210)  ipratropium-albuterol (DUONEB) 0.5-2.5 (3) MG/3ML nebulizer solution 3 mL (3 mLs Nebulization Given 12/12/19 0358)  dexamethasone (DECADRON) injection  10 mg (10 mg Intravenous Given 12/12/19 0422)  acetaminophen (TYLENOL) tablet 1,000 mg (1,000 mg Oral Given 12/12/19 0652)  morphine 4 MG/ML injection 4 mg (4 mg Intravenous Given 12/12/19 0652)  ceFEPIme (MAXIPIME) 2 g in sodium chloride 0.9 % 100 mL IVPB (0 g Intravenous Stopped 12/12/19 0830)  metroNIDAZOLE (FLAGYL) IVPB 500 mg (0 mg Intravenous Stopped 12/12/19 0918)  lactated ringers bolus 1,000 mL (0 mLs Intravenous Stopped 12/12/19 0916)  lactated ringers bolus 1,000 mL (0 mLs Intravenous Stopped 12/12/19 1210)  HYDROcodone-acetaminophen (NORCO/VICODIN) 5-325 MG per tablet 1 tablet (1 tablet Oral Given 12/12/19 1652)  sodium chloride 0.9 % bolus 250 mL ( Intravenous Stopped 12/13/19 1542)  sodium chloride 0.9 % bolus 250 mL (250 mLs Intravenous New Bag/Given 12/14/19 1158)    Note:  This document was prepared using Dragon voice recognition software and may include unintentional dictation errors.  Nanda Quinton, MD, [facility] Emergency Medicine    [place], [place], MD 12/14/19 [phone]

## 2019-12-12 NOTE — ED Notes (Signed)
Pt attempted to provide urine sample. Unsuccessful.

## 2019-12-12 NOTE — ED Triage Notes (Signed)
Pt c/o abd pain, N/V/D, ShOB, and chills that started this AM. Pt is on home O2 at 2L via N/C.

## 2019-12-12 NOTE — ED Notes (Addendum)
Pt's wife called out with a question. States that she is concerned that pt is sweating and states pt. does this at home. Pt is diaphoretic. Pt is also bundled in a lot of blankets. Temp  98.5.  VSS. Paced rhythm noted.  Dr. Laverta Baltimore aware.

## 2019-12-12 NOTE — ED Notes (Signed)
Pt and family updated regarding bed assignment, [facility] to arrive to transfer in approx 20 minutes.

## 2019-12-12 NOTE — ED Notes (Signed)
Date and time results received: 12/12/19 0704   Test: lactic acid Critical Value: 2.5  Name of Provider Notified: Long  Orders Received? Or Actions Taken?: no orders given

## 2019-12-12 NOTE — ED Notes (Signed)
Date and time results received: 12/12/19 0923   Test: lactic acid Critical Value:2.6  Name of Provider Notified: Ron Parker  Orders Received? Or Actions Taken?: no orders given

## 2019-12-13 ENCOUNTER — Inpatient Hospital Stay (HOSPITAL_COMMUNITY): Payer: Medicare Other

## 2019-12-13 ENCOUNTER — Encounter (HOSPITAL_COMMUNITY): Payer: Self-pay | Admitting: Internal Medicine

## 2019-12-13 ENCOUNTER — Encounter (HOSPITAL_COMMUNITY): Payer: Medicare Other

## 2019-12-13 DIAGNOSIS — R110 Nausea: Secondary | ICD-10-CM

## 2019-12-13 DIAGNOSIS — E1122 Type 2 diabetes mellitus with diabetic chronic kidney disease: Secondary | ICD-10-CM | POA: Insufficient documentation

## 2019-12-13 DIAGNOSIS — I480 Paroxysmal atrial fibrillation: Secondary | ICD-10-CM

## 2019-12-13 DIAGNOSIS — E119 Type 2 diabetes mellitus without complications: Secondary | ICD-10-CM | POA: Insufficient documentation

## 2019-12-13 DIAGNOSIS — N1832 Chronic kidney disease, stage 3b: Secondary | ICD-10-CM | POA: Diagnosis present

## 2019-12-13 DIAGNOSIS — I340 Nonrheumatic mitral (valve) insufficiency: Secondary | ICD-10-CM

## 2019-12-13 DIAGNOSIS — J208 Acute bronchitis due to other specified organisms: Secondary | ICD-10-CM

## 2019-12-13 DIAGNOSIS — I361 Nonrheumatic tricuspid (valve) insufficiency: Secondary | ICD-10-CM

## 2019-12-13 DIAGNOSIS — I5032 Chronic diastolic (congestive) heart failure: Secondary | ICD-10-CM

## 2019-12-13 DIAGNOSIS — K219 Gastro-esophageal reflux disease without esophagitis: Secondary | ICD-10-CM

## 2019-12-13 DIAGNOSIS — B9689 Other specified bacterial agents as the cause of diseases classified elsewhere: Secondary | ICD-10-CM | POA: Diagnosis present

## 2019-12-13 DIAGNOSIS — I272 Pulmonary hypertension, unspecified: Secondary | ICD-10-CM

## 2019-12-13 DIAGNOSIS — J9620 Acute and chronic respiratory failure, unspecified whether with hypoxia or hypercapnia: Secondary | ICD-10-CM

## 2019-12-13 DIAGNOSIS — N183 Chronic kidney disease, stage 3 unspecified: Secondary | ICD-10-CM

## 2019-12-13 DIAGNOSIS — J441 Chronic obstructive pulmonary disease with (acute) exacerbation: Secondary | ICD-10-CM | POA: Diagnosis present

## 2019-12-13 DIAGNOSIS — E039 Hypothyroidism, unspecified: Secondary | ICD-10-CM | POA: Diagnosis present

## 2019-12-13 HISTORY — DX: Type 2 diabetes mellitus without complications: E11.9

## 2019-12-13 HISTORY — DX: Nausea: R11.0

## 2019-12-13 LAB — ECHOCARDIOGRAM COMPLETE
AR max vel: 2.12 cm2
AV Area VTI: 2.04 cm2
AV Area mean vel: 2.01 cm2
AV Mean grad: 8 mmHg
AV Peak grad: 13.5 mmHg
Ao pk vel: 1.84 m/s
Area-P 1/2: 4.15 cm2
Height: 71 in
S' Lateral: 3.1 cm
Weight: 3097.02 oz

## 2019-12-13 LAB — HIV ANTIBODY (ROUTINE TESTING W REFLEX): HIV Screen 4th Generation wRfx: NONREACTIVE

## 2019-12-13 LAB — CBC WITH DIFFERENTIAL/PLATELET
Abs Immature Granulocytes: 0.06 10*3/uL (ref 0.00–0.07)
Basophils Absolute: 0 10*3/uL (ref 0.0–0.1)
Basophils Relative: 0 %
Eosinophils Absolute: 0 10*3/uL (ref 0.0–0.5)
Eosinophils Relative: 0 %
HCT: 39.1 % (ref 39.0–52.0)
Hemoglobin: 12.8 g/dL — ABNORMAL LOW (ref 13.0–17.0)
Immature Granulocytes: 1 %
Lymphocytes Relative: 2 %
Lymphs Abs: 0.2 10*3/uL — ABNORMAL LOW (ref 0.7–4.0)
MCH: 31.3 pg (ref 26.0–34.0)
MCHC: 32.7 g/dL (ref 30.0–36.0)
MCV: 95.6 fL (ref 80.0–100.0)
Monocytes Absolute: 0.2 10*3/uL (ref 0.1–1.0)
Monocytes Relative: 2 %
Neutro Abs: 12.3 10*3/uL — ABNORMAL HIGH (ref 1.7–7.7)
Neutrophils Relative %: 95 %
Platelets: 142 10*3/uL — ABNORMAL LOW (ref 150–400)
RBC: 4.09 MIL/uL — ABNORMAL LOW (ref 4.22–5.81)
RDW: 14 % (ref 11.5–15.5)
WBC: 12.8 10*3/uL — ABNORMAL HIGH (ref 4.0–10.5)
nRBC: 0 % (ref 0.0–0.2)

## 2019-12-13 LAB — GLUCOSE, CAPILLARY
Glucose-Capillary: 145 mg/dL — ABNORMAL HIGH (ref 70–99)
Glucose-Capillary: 174 mg/dL — ABNORMAL HIGH (ref 70–99)
Glucose-Capillary: 183 mg/dL — ABNORMAL HIGH (ref 70–99)
Glucose-Capillary: 169 mg/dL — ABNORMAL HIGH (ref 70–99)
Glucose-Capillary: 191 mg/dL — ABNORMAL HIGH (ref 70–99)

## 2019-12-13 LAB — BASIC METABOLIC PANEL
Anion gap: 10 (ref 5–15)
BUN: 18 mg/dL (ref 8–23)
CO2: 26 mmol/L (ref 22–32)
Calcium: 9.6 mg/dL (ref 8.9–10.3)
Chloride: 105 mmol/L (ref 98–111)
Creatinine, Ser: 1.09 mg/dL (ref 0.61–1.24)
GFR calc Af Amer: >60 mL/min (ref >60)
GFR calc non Af Amer: >60 mL/min (ref >60)
Glucose, Bld: 172 mg/dL — ABNORMAL HIGH (ref 70–99)
Potassium: 4.5 mmol/L (ref 3.5–5.1)
Sodium: 141 mmol/L (ref 135–145)

## 2019-12-13 LAB — LACTIC ACID, PLASMA
Lactic Acid, Venous: 2.8 mmol/L (ref 0.5–1.9)
Lactic Acid, Venous: 3.9 mmol/L (ref 0.5–1.9)

## 2019-12-13 LAB — BRAIN NATRIURETIC PEPTIDE: B Natriuretic Peptide: 611.6 pg/mL — ABNORMAL HIGH (ref 0.0–100.0)

## 2019-12-13 LAB — MAGNESIUM: Magnesium: 1.9 mg/dL (ref 1.7–2.4)

## 2019-12-13 LAB — PROCALCITONIN: Procalcitonin: 0.93 ng/mL

## 2019-12-13 MED ORDER — AMLODIPINE BESYLATE 5 MG PO TABS
5.0000 mg | ORAL_TABLET | Freq: Every day | ORAL | Status: DC
  Administered 2019-12-13 – 2019-12-14 (×2): 5 mg via ORAL
  Filled 2019-12-13 (×2): qty 1

## 2019-12-13 MED ORDER — INSULIN ASPART 100 UNIT/ML ~~LOC~~ SOLN
0.0000 [IU] | Freq: Three times a day (TID) | SUBCUTANEOUS | Status: DC
  Administered 2019-12-13: 2 [IU] via SUBCUTANEOUS
  Administered 2019-12-13 – 2019-12-14 (×5): 3 [IU] via SUBCUTANEOUS

## 2019-12-13 MED ORDER — LEVOTHYROXINE SODIUM 75 MCG PO TABS
175.0000 ug | ORAL_TABLET | Freq: Every day | ORAL | Status: DC
  Administered 2019-12-13 – 2019-12-14 (×2): 175 ug via ORAL
  Filled 2019-12-13 (×2): qty 1

## 2019-12-13 MED ORDER — ENSURE MAX PROTEIN PO LIQD
11.0000 [oz_av] | Freq: Two times a day (BID) | ORAL | Status: DC
  Administered 2019-12-13 (×2): 11 [oz_av] via ORAL
  Filled 2019-12-13 (×4): qty 330

## 2019-12-13 MED ORDER — BISOPROLOL FUMARATE 5 MG PO TABS
5.0000 mg | ORAL_TABLET | Freq: Every day | ORAL | Status: DC
  Administered 2019-12-13 – 2019-12-14 (×2): 5 mg via ORAL
  Filled 2019-12-13 (×2): qty 1

## 2019-12-13 MED ORDER — SPIRONOLACTONE 25 MG PO TABS
25.0000 mg | ORAL_TABLET | Freq: Every day | ORAL | Status: DC
  Administered 2019-12-13 – 2019-12-14 (×2): 25 mg via ORAL
  Filled 2019-12-13 (×2): qty 1

## 2019-12-13 MED ORDER — SODIUM CHLORIDE 0.9 % IV SOLN
100.0000 mg | Freq: Two times a day (BID) | INTRAVENOUS | Status: DC
  Administered 2019-12-13 – 2019-12-14 (×3): 100 mg via INTRAVENOUS
  Filled 2019-12-13 (×4): qty 100

## 2019-12-13 MED ORDER — TORSEMIDE 20 MG PO TABS
40.0000 mg | ORAL_TABLET | ORAL | Status: DC
  Administered 2019-12-13: 40 mg via ORAL
  Filled 2019-12-13: qty 2

## 2019-12-13 MED ORDER — ADULT MULTIVITAMIN W/MINERALS CH
1.0000 | ORAL_TABLET | Freq: Every day | ORAL | Status: DC
  Administered 2019-12-13 – 2019-12-14 (×2): 1 via ORAL
  Filled 2019-12-13 (×2): qty 1

## 2019-12-13 MED ORDER — INSULIN ASPART 100 UNIT/ML ~~LOC~~ SOLN
0.0000 [IU] | Freq: Three times a day (TID) | SUBCUTANEOUS | Status: DC
Start: 2019-12-13 — End: 2019-12-13

## 2019-12-13 MED ORDER — ROSUVASTATIN CALCIUM 5 MG PO TABS
10.0000 mg | ORAL_TABLET | Freq: Every day | ORAL | Status: DC
  Administered 2019-12-13 (×2): 10 mg via ORAL
  Filled 2019-12-13 (×2): qty 2

## 2019-12-13 MED ORDER — DOFETILIDE 250 MCG PO CAPS
250.0000 ug | ORAL_CAPSULE | Freq: Two times a day (BID) | ORAL | Status: DC
  Administered 2019-12-13 – 2019-12-14 (×4): 250 ug via ORAL
  Filled 2019-12-13 (×4): qty 1

## 2019-12-13 MED ORDER — PANTOPRAZOLE SODIUM 40 MG PO TBEC
40.0000 mg | DELAYED_RELEASE_TABLET | Freq: Every day | ORAL | Status: DC
  Administered 2019-12-13 – 2019-12-14 (×2): 40 mg via ORAL
  Filled 2019-12-13 (×2): qty 1

## 2019-12-13 MED ORDER — GABAPENTIN 400 MG PO CAPS
1200.0000 mg | ORAL_CAPSULE | Freq: Every day | ORAL | Status: DC
  Administered 2019-12-13 (×2): 1200 mg via ORAL
  Filled 2019-12-13 (×2): qty 3

## 2019-12-13 MED ORDER — METHYLPREDNISOLONE SODIUM SUCC 125 MG IJ SOLR
60.0000 mg | Freq: Two times a day (BID) | INTRAMUSCULAR | Status: DC
  Administered 2019-12-13 – 2019-12-14 (×4): 60 mg via INTRAVENOUS
  Filled 2019-12-13 (×4): qty 2

## 2019-12-13 MED ORDER — APIXABAN 5 MG PO TABS
5.0000 mg | ORAL_TABLET | Freq: Two times a day (BID) | ORAL | Status: DC
  Administered 2019-12-13 – 2019-12-14 (×4): 5 mg via ORAL
  Filled 2019-12-13 (×4): qty 1

## 2019-12-13 MED ORDER — GABAPENTIN 300 MG PO CAPS
600.0000 mg | ORAL_CAPSULE | Freq: Two times a day (BID) | ORAL | Status: DC
  Administered 2019-12-13 – 2019-12-14 (×4): 600 mg via ORAL
  Filled 2019-12-13 (×4): qty 2

## 2019-12-13 MED ORDER — TRAMADOL-ACETAMINOPHEN 37.5-325 MG PO TABS
2.0000 | ORAL_TABLET | Freq: Two times a day (BID) | ORAL | Status: DC
  Administered 2019-12-13 – 2019-12-14 (×4): 2 via ORAL
  Filled 2019-12-13 (×4): qty 2

## 2019-12-13 MED ORDER — INSULIN ASPART 100 UNIT/ML ~~LOC~~ SOLN: 0.0000 [IU] | Freq: Every day | SUBCUTANEOUS | Status: DC

## 2019-12-13 MED ORDER — ONDANSETRON HCL 4 MG/2ML IJ SOLN
4.0000 mg | Freq: Four times a day (QID) | INTRAMUSCULAR | Status: DC | PRN
  Administered 2019-12-13: 4 mg via INTRAVENOUS
  Filled 2019-12-13: qty 2

## 2019-12-13 MED ORDER — SODIUM CHLORIDE 0.9 % IV BOLUS
250.0000 mL | Freq: Once | INTRAVENOUS | Status: AC
  Administered 2019-12-13: 250 mL via INTRAVENOUS

## 2019-12-13 MED ORDER — SODIUM CHLORIDE 0.9 % IV SOLN: INTRAVENOUS | Status: DC

## 2019-12-13 MED ORDER — SODIUM CHLORIDE 0.9 % IV SOLN: 100.0000 mg | Freq: Two times a day (BID) | INTRAVENOUS | Status: DC

## 2019-12-13 MED ORDER — GABAPENTIN 300 MG PO CAPS: 600.0000 mg | ORAL_CAPSULE | ORAL | Status: DC

## 2019-12-13 NOTE — Progress Notes (Signed)
Dr. Alfredia Ferguson aware that lactic acid has increased from 2.8 to 3.9. Received new orders for IVF.

## 2019-12-13 NOTE — Progress Notes (Signed)
OT Cancellation Note  Patient Details Name: [name] MRN: [id number] DOB: 05-Jun-1950   Cancelled Treatment:    Reason Eval/Treat Not Completed: OT screened, no needs identified, will sign off; pt up mobilizing with PT independently, per PT and pt report able to perform ADL without difficulty, pt reports feeling at his baseline and with no acute OT needs, will sign off. Please re-consult should pt's needs change.  [name], OT Acute Rehabilitation Services Pager [phone] Office [phone]   [name] 12/13/2019, 2:17 PM

## 2019-12-13 NOTE — Progress Notes (Signed)
RN did not perform bladder scan as patient voided 500 mL without difficulty.

## 2019-12-13 NOTE — Progress Notes (Signed)
Care started prior to midnight in the emergency room and the patient was admitted early this morning after midnight by Dr. Inda Merlin and I am in current agreement with his assessment and plan.  Additional changes to the plan of care been made accordingly.  The patient is a 69 year old Caucasian male with a past medical history significant for but not limited to proximal afibrillation status post AF ablation/maze status post pacemaker followed by Dr. 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  Patient states that Saturday morning he started developing shortness of breath which was associated with wheezing.  He also noticed change in frequency of his chronic cough and increasing amount of sputum production as well as change in the color of the sputum.  He denies any chest pain.  He also had loss of appetite and generalized weakness and stated that when he tries to eat he started having dry heaving's and also complained of some generalized abdominal pain but states it is lower down in his abdomen.  Is currently being treated for COPD exacerbation and was started on IV doxycycline and systemic steroids along with his aggressive bronchodilator therapy.  Patient refused ABG and ended up temporarily being placed on BiPAP.  Currently being treated for an acute bacterial bronchitis and he does have a history of PAF status post A. fib ablation/maze procedure status post pacemaker and he is currently on home Tikosyn as well as Eliquis.  Patient also has CKD stage IIIb and also has GERD with esophagitis.  Patient has a significant history of  chronic grade 3 diastolic heart failure with right ventricular failure as well as pulmonary hypertension and he did have an elevated BNP on admission greater than his baseline 200s.  We will consult the heart failure team for further recommendations.  Currently he is being treated for the following not limited to:  COPD with acute exacerbation ([place]) -Patient presenting with 24-hour history of severe shortness of breath with associated wheezing with increasing frequency of cough with increasing sputum production. -Clinically, with prolonged pressure phase and expiratory wheezing patient appears to be suffering from a COPD exacerbation.   -No evidence of focal pulmonary infiltrate on chest x-ray although there is some peribronchial thickening.  This combined with patient's increase sputum production leads to concern for superimposed acute bacterial bronchitis. -COVID-19 testing negative. -Patient patient on doxycycline IV twice daily -Treating patient with intravenous systemic steroids -Aggressive bronchodilator therapy -Patient currently is extremely tachypneic upon arrival to the medical floor and may benefit from temporarily placing the patient on BiPAP to improve work of breathing. -Patient is refusing ABG at this time. -Monitoring closely for symptomatic improvement.  Acute Bacterial Bronchitis -Please see assessment and plan above.  PAF (paroxysmal atrial fibrillation) (HCC) s/p AF Ablation/Maze Procedure s/p Pacemaker -Continue home regimen of Tikosyn and Eliquis -Monitoring patient on telemetry -Patient is currently rate controlled with a paced rhythm.  Hypothyroidism -Continue home regimen of Synthroid  Controlled type 2 diabetes mellitus with stage 3 chronic kidney disease, without long-term current use of insulin ([place]) -Well-controlled diabetes with last hemoglobin A1c in June of 6.6. -Since patient will be on systemic steroids, performing Accu-Cheks before every meal and  nightly with sliding scale insulin in case of steroid-induced hyperglycemia.  Chronic kidney disease, stage 3b -Renal function appears to be better than baseline based on today's chemistry. -Strict input and output monitoring -Limiting nephrotoxic agents -Monitor function and electrolytes with serial chemistries.  GERD without esophagitis -Continue home regimen of PPI  Chronic Grade 3 Diastolic CHF (congestive heart failure) (HCC) with RV Failure  -While patient has a history of diastolic congestive heart failure, patient exhibits no clinical evidence of acute congestive heart failure. -While chest x-ray does not reveal any evidence of overt pulmonary edema there is some peribronchial thickening.  -We will obtain BNP to compare to his baseline of mid 200s.  BNP is markedly elevated this could suggest that there is some superimposed volume overload. -Repeated ECHO -Will consult Cardiology for further evaluation and recommendations  We'll continue to monitor the patient's clinical response to intervention and repeat blood work and imaging in the a.m. and follow-up on cardiology recommendations and consider pulmonary consultation if the patient is not improving.

## 2019-12-13 NOTE — Progress Notes (Addendum)
Initial Nutrition Assessment  DOCUMENTATION CODES:   Not applicable  INTERVENTION:  Ensure Max po BID, each supplement provides 150 kcal and 30 grams of protein  MVI daily  NUTRITION DIAGNOSIS:   Inadequate oral intake related to chronic illness (COPD exacerbation) as evidenced by per patient/family report.    GOAL:   Patient will meet greater than or equal to 90% of their needs    MONITOR:   PO intake, Supplement acceptance, Labs, Weight trends, I & O's  REASON FOR ASSESSMENT:   Consult Assessment of nutrition requirement/status  ASSESSMENT:   Pt admitted with COPD exacerbation. PMH includes Afib s/p pacemaker, COPD, chronic respiratory failure (on 2L/m via [place] at baseline), CKD stage III, Type 2 DM, hypothyroidism.  Pt reports poor appetite since Saturday, when his shortness of breath developed. Pt states that he began dry heaving every time he attempted to eat. Prior to that, pt reports eating 3 balanced meals per day. Pt agreeable to using oral nutrition supplements while admitted.   Reviewed wt history. No significant wt changes noted.   No PO intake documented.  Labs: CBGs [phone] Medications: Novolog, Solu-medrol, Aldactone, Protonix  NUTRITION - FOCUSED PHYSICAL EXAM:    Most Recent Value  Orbital Region No depletion  Upper Arm Region No depletion  Thoracic and Lumbar Region No depletion  Buccal Region No depletion  Temple Region No depletion  Clavicle Bone Region No depletion  Clavicle and Acromion Bone Region No depletion  Scapular Bone Region No depletion  Dorsal Hand No depletion  Patellar Region Moderate depletion  Anterior Thigh Region Moderate depletion  Posterior Calf Region Moderate depletion  Edema (RD Assessment) None  Hair Reviewed  Eyes Reviewed  Mouth Reviewed  Skin Reviewed  Nails Reviewed       Diet Order:   Diet Order            Diet heart healthy/carb modified Room service appropriate? Yes; Fluid consistency: Thin   Diet effective now                 EDUCATION NEEDS:   No education needs have been identified at this time  Skin:  Skin Assessment: Reviewed RN Assessment  Last BM:  8/14  Height:   Ht Readings from Last 1 Encounters:  12/12/19 5\' 11"  (1.803 m)    Weight:   Wt Readings from Last 10 Encounters:  12/13/19 87.8 kg  11/30/19 81.6 kg  11/18/19 87.1 kg  11/05/19 89.8 kg  10/28/19 85.6 kg  10/20/19 86.7 kg  10/15/19 86.2 kg  09/09/19 86.2 kg  06/15/19 91.4 kg  03/31/19 91.2 kg    BMI:  Body mass index is 27 kg/m.  Estimated Nutritional Needs:   Kcal:  2150-2350  Protein:  105-115 grams  Fluid:  >/=2.15L/d    Larkin Ina, MS, RD, LDN RD pager number and weekend/on-call pager number located in [place].

## 2019-12-13 NOTE — H&P (Signed)
History and Physical    [name] [name] DOB: 25-Jan-1951 DOA: 12/12/2019  PCP: Victor Hatchet, MD  Patient coming from: Home - Transfer to [facility] from [facility]   Chief Complaint:  Chief Complaint  Patient presents with  . Emesis     HPI:    69 year old male with past medical history of paroxysmal atrial fibrillation (on Tikosyn/Eliquis), status post pacemaker, COPD, chronic respiratory failure ( 2 LPM via [place] at all times), chronic kidney disease stage IIIb, diabetes mellitus type 2, hypothyroidism who presents to [place] emergency department complaints of shortness of breath.  Patient explains that on Saturday morning he suddenly began to develop shortness of breath.  This was associated with wheezing.  Patient also noticed that he began to develop a change in the frequency of his chronic cough with an increase in the amount of his sputum production as well as a change in the color of his sputum.  Patient denies any associated chest pain.  Patient states that shortness of breath is severe in intensity and worse with minimal exertion and improved with rest.  Patient denies any associated fevers, sick contacts, recent travel or confirmed contact with COVID-19 infection.  Patient states that he is compliant with all of his maintenance inhalers.  Over the same span of time patient states that he has a loss of appetite and generalized weakness.  Patient states that anytime he begins to try to eat he begins to dry heave.  Patient also complains of generalized abdominal pain but states that this abdominal pain is chronic.  Patient states that he is moving his bowels regularly without diarrhea or blood in his stool.  Patient's symptoms continue to persist overnight into the patient eventually presented to [place] emergency department for evaluation.  Upon evaluation in [place] emergency department patient was clinically felt to be suffering from a COPD  exacerbation.  Hospitalist group was then called at [facility] to accept patient for transfer to 6 E. progressive care unit.  Review of Systems:   Review of Systems  Respiratory: Positive for cough, sputum production, shortness of breath and wheezing.   Gastrointestinal: Positive for abdominal pain, nausea and vomiting.  Neurological: Positive for weakness.  All other systems reviewed and are negative.    Past Medical History:  Diagnosis Date  . Aortic stenosis, mild   . Arthritis of hand    "just a little bit in both hands" (03/31/2012)  . Asthma    "little bit" (03/31/2012)  . Atrial fibrillation [facility])    dx '04; DCCV '04, placed on flecainide, failed DCCV 04/2010, flecainide stopped; s/p successful A.fib ablation 01/31/12  . CHF (congestive heart failure) ([place])   . Controlled type 2 diabetes mellitus without complication, without long-term current use of insulin ([place]) 12/13/2019  . COPD (chronic obstructive pulmonary disease) ([place])   . Diabetes mellitus without complication (HCC)    borderline  . DJD (degenerative joint disease)   . Exertional dyspnea   . Fibromyalgia   . First degree AV block    post ablation, ~436ms  . GERD (gastroesophageal reflux disease)   . Heart murmur   . Hyperlipidemia   . Hypertension   . Hypothyroidism    S/P radiation  . Left atrial enlargement    LA size 79mm by echo 11/21/11  . Mitral regurgitation    trivial  . Obesity   . Obstructive sleep apnea    mild by sleep study 2013;  pt stated he does not have a machine because "it wasn't bad enough for him to have one"  . Pacemaker 03/31/2012  . Restless leg syndrome   . Second degree AV block   . Splenomegaly   . Visit for monitoring Tikosyn therapy 04/16/2016    Past Surgical History:  Procedure Laterality Date  . AORTIC VALVE REPLACEMENT N/A 08/22/2015   Procedure: AORTIC VALVE REPLACEMENT (AVR);  Surgeon: Ivin Poot, MD;  Location: [place];  Service: Open Heart Surgery;   Laterality: N/A;  . ATRIAL FIBRILLATION ABLATION  01/30/2012   PVI by Dr. Rayann Heman  . ATRIAL FIBRILLATION ABLATION N/A 01/31/2012   Procedure: ATRIAL FIBRILLATION ABLATION;  Surgeon: Thompson Grayer, MD;  Location: [facility] CATH LAB;  Service: Cardiovascular;  Laterality: N/A;  . BACK SURGERY     X 3  . CARDIAC CATHETERIZATION N/A 08/09/2015   Procedure: Right/Left Heart Cath and Coronary Angiography;  Surgeon: Peter M Martinique, MD;  Location: [place] CV LAB;  Service: Cardiovascular;  Laterality: N/A;  . CARDIAC CATHETERIZATION N/A 02/05/2016   Procedure: Right/Left Heart Cath and Coronary Angiography;  Surgeon: Jettie Booze, MD;  Location: [place] CV LAB;  Service: Cardiovascular;  Laterality: N/A;  . CARDIAC CATHETERIZATION N/A 04/17/2016   Procedure: Right Heart Cath;  Surgeon: Jolaine Artist, MD;  Location: [place] CV LAB;  Service: Cardiovascular;  Laterality: N/A;  . CARDIOVASCULAR STRESS TEST  03/12/2002   EF 48%, NO EVIDENCE OF ISCHEMIA  . CARDIOVERSION  01/2012; 03/31/2012  . CARDIOVERSION N/A 02/25/2012   Procedure: CARDIOVERSION;  Surgeon: Thompson Grayer, MD;  Location: [facility] CATH LAB;  Service: Cardiovascular;  Laterality: N/A;  . CARDIOVERSION N/A 03/31/2012   Procedure: CARDIOVERSION;  Surgeon: Thompson Grayer, MD;  Location: [facility] CATH LAB;  Service: Cardiovascular;  Laterality: N/A;  . CARDIOVERSION N/A 11/23/2015   Procedure: CARDIOVERSION;  Surgeon: Larey Dresser, MD;  Location: [place];  Service: Cardiovascular;  Laterality: N/A;  . CARDIOVERSION N/A 04/08/2016   Procedure: CARDIOVERSION;  Surgeon: Jolaine Artist, MD;  Location: [facility] ENDOSCOPY;  Service: Cardiovascular;  Laterality: N/A;  . CARDIOVERSION N/A 09/14/2018   Procedure: CARDIOVERSION;  Surgeon: Josue Hector, MD;  Location: [facility] ENDOSCOPY;  Service: Cardiovascular;  Laterality: N/A;  . CARDIOVERSION N/A 10/19/2019   Procedure: CARDIOVERSION;  Surgeon: Jolaine Artist, MD;  Location: [place];   Service: Cardiovascular;  Laterality: N/A;  . CLIPPING OF ATRIAL APPENDAGE N/A 08/22/2015   Procedure: CLIPPING OF ATRIAL APPENDAGE;  Surgeon: Ivin Poot, MD;  Location: [place];  Service: Open Heart Surgery;  Laterality: N/A;  . DOPPLER ECHOCARDIOGRAPHY  03/11/2002   EF 70-75%  . FINGER SURGERY Left    Middle finger  . FINGER TENDON REPAIR  1980's   "right little finger" (03/31/2012)  . INSERT / REPLACE / REMOVE PACEMAKER     [employer]  . IR RADIOLOGIST EVAL & MGMT  05/06/2019  . IR RADIOLOGIST EVAL & MGMT  10/06/2019  . LEFT AND RIGHT HEART CATHETERIZATION WITH CORONARY ANGIOGRAM N/A 10/27/2012   Procedure: LEFT AND RIGHT HEART CATHETERIZATION WITH CORONARY ANGIOGRAM;  Surgeon: Laverda Page, MD;  Location: [facility] CATH LAB;  Service: Cardiovascular;  Laterality: N/A;  . LUMBAR DISC SURGERY  1980's X2;  2000's  . MAZE N/A 08/22/2015   Procedure: MAZE;  Surgeon: Ivin Poot, MD;  Location: [place];  Service: Open Heart Surgery;  Laterality: N/A;  . PACEMAKER INSERTION  03/31/2012   STJ Accent DR pacemaker implanted by Dr  Allred  . PERMANENT PACEMAKER INSERTION N/A 03/31/2012   Procedure: PERMANENT PACEMAKER INSERTION;  Surgeon: Thompson Grayer, MD;  Location: [facility] CATH LAB;  Service: Cardiovascular;  Laterality: N/A;  . TEE WITHOUT CARDIOVERSION  01/30/2012   Procedure: TRANSESOPHAGEAL ECHOCARDIOGRAM (TEE);  Surgeon: Larey Dresser, MD;  Location: [place];  Service: Cardiovascular;  Laterality: N/A;  ablation next day  . TEE WITHOUT CARDIOVERSION N/A 08/22/2015   Procedure: TRANSESOPHAGEAL ECHOCARDIOGRAM (TEE);  Surgeon: Ivin Poot, MD;  Location: [place];  Service: Open Heart Surgery;  Laterality: N/A;  . TEE WITHOUT CARDIOVERSION N/A 10/19/2019   Procedure: TRANSESOPHAGEAL ECHOCARDIOGRAM (TEE);  Surgeon: Jolaine Artist, MD;  Location: [facility] ENDOSCOPY;  Service: Cardiovascular;  Laterality: N/A;  . US ECHOCARDIOGRAPHY  08/07/2009   EF 55-60%     reports that he quit smoking about 28 years  ago. His smoking use included cigarettes. He has a 7.50 pack-year smoking history. He has never used smokeless tobacco. He reports current alcohol use. He reports current drug use. Drug: Marijuana.  Allergies  Allergen Reactions  . Meloxicam Rash  . 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 syndrome 09/02/15, resolved with diphenhydramine and slowing of rate    Family History  Problem Relation Age of Onset  . Heart failure Mother   . Stroke Father      Prior to Admission medications   Medication Sig Start Date End Date Taking? Authorizing Provider  albuterol (PROVENTIL HFA;VENTOLIN HFA) 108 (90 Base) MCG/ACT inhaler Inhale 2 puffs into the lungs every 6 (six) hours as needed for wheezing or shortness of breath. 12/28/15   Collene Gobble, MD  amLODipine (NORVASC) 10 MG tablet Take 0.5 tablets (5 mg total) by mouth daily. 09/18/15   Nahser, Wonda Cheng, MD  apixaban (ELIQUIS) 5 MG TABS tablet TAKE 1 TABLET(5 MG) BY MOUTH TWICE DAILY Patient taking differently: Take 5 mg by mouth 2 (two) times daily.  02/25/19   Bensimhon, Shaune Pascal, MD  bisoprolol (ZEBETA) 5 MG tablet Take 1 tablet (5 mg total) by mouth daily. 11/16/19 12/16/19  Bensimhon, Shaune Pascal, MD  Budeson-Glycopyrrol-Formoterol (BREZTRI AEROSPHERE) 160-9-4.8 MCG/ACT AERO Inhale 2 puffs into the lungs 2 (two) times daily. 11/05/19   Martyn Ehrich, NP  Cholecalciferol (VITAMIN D) 2000 UNITS CAPS Take 8,000 Units by mouth at bedtime.     [provider]  dofetilide (TIKOSYN) 250 MCG capsule Take 1 capsule (250 mcg total) by mouth 2 (two) times daily. 11/02/19   Sherran Needs, NP  fexofenadine (ALLEGRA) 180 MG tablet Take 1 tablet (180 mg total) by mouth daily. Patient taking differently: Take 180 mg by mouth daily as needed for allergies or rhinitis.  03/01/14   Collene Gobble, MD  gabapentin (NEURONTIN) 300 MG capsule Take 600-1,200 mg by mouth See admin instructions. Take 2 capsules ( 600 mg) by mouth in the morning and  at  lunchtime, and take 4 capsules (1,200 mg) at bedtime 09/13/14   [provider]  HYDROcodone-acetaminophen (NORCO/VICODIN) 5-325 MG tablet Take 1 tablet by mouth 2 (two) times daily as needed (pain).     [provider]  levothyroxine (SYNTHROID, LEVOTHROID) 175 MCG tablet Take 175 mcg by mouth daily before breakfast.    [provider]  MAGNESIUM PO Take 400 mcg by mouth at bedtime. Has zinc/calcium    [provider]  mometasone-formoterol (DULERA) 100-5 MCG/ACT AERO Inhale 2 puffs into the lungs 2 (two) times daily. 10/20/19   Terrilee Croak, MD  ondansetron (ZOFRAN ODT) 4 MG disintegrating tablet Take 1 tablet (4 mg total) by mouth every 8 (eight) hours as needed. 12/12/19   Long, Wonda Olds, MD  OXYGEN Inhale 2 L into the lungs continuous.    [provider]  pantoprazole (PROTONIX) 40 MG tablet Take 40 mg by mouth daily.    [provider]  polyethylene glycol powder (GLYCOLAX/MIRALAX) powder Take 17 g by mouth daily. Patient taking differently: Take 17 g by mouth daily as needed for mild constipation.  05/08/18   Shirley, [place], DO  potassium chloride SA (KLOR-CON) 20 MEQ tablet Take 1 tablet (20 mEq total) by mouth 2 (two) times daily. Needs appt for further refills Patient taking differently: Take 20 mEq by mouth See admin instructions. Take one tablet (20 meq) by mouth in the morning and at night on the days that you take torsemide (every other day) 09/06/19   Bensimhon, Shaune Pascal, MD  predniSONE (DELTASONE) 5 MG tablet Take 1 tablet (5 mg total) by mouth daily with breakfast. 11/05/19   Martyn Ehrich, NP  rOPINIRole (REQUIP) 1 MG tablet Take 1 tablet (1 mg total) by mouth daily. Patient taking differently: Take 4 mg by mouth at bedtime.  12/24/13   Collene Gobble, MD  rosuvastatin (CRESTOR) 10 MG tablet Take 10 mg by mouth at bedtime.  08/08/16   [provider]  spironolactone (ALDACTONE) 25 MG tablet Take 1 tablet (25 mg total) by  mouth daily. 02/16/16   Shirley Friar, PA-C  torsemide (DEMADEX) 20 MG tablet TAKE 2 TABLETS BY MOUTH  DAILY Patient taking differently: Take 40 mg by mouth every other day.  12/21/18   Bensimhon, Shaune Pascal, MD  traMADol-acetaminophen (ULTRACET) 37.5-325 MG tablet Take 2 tablets by mouth 2 (two) times daily.    [provider]  traZODone (DESYREL) 50 MG tablet Take 12.5 mg by mouth at bedtime.     [provider]    Physical Exam: Vitals:   12/12/19 1906 12/12/19 2000 12/12/19 2049 12/12/19 2130  BP: 135/81 131/76 140/81 (!) 143/71  Pulse: 74 85 77 75  Resp: (!) 21   (!) 22  Temp: 97.7 F (36.5 C)   98 F (36.7 C)  TempSrc: Oral   Oral  SpO2: 99% 99% 100% 97%  Weight:    87.2 kg  Height:    5\' 11"  (1.803 m)    Constitutional: Acute alert and oriented x3, patient is in respiratory distress Skin: no rashes, no lesions, good skin turgor noted. Eyes: Pupils are equally reactive to light.  No evidence of scleral icterus or conjunctival pallor.  ENMT: Moist mucous membranes noted.  Posterior pharynx clear of any exudate or lesions.   Neck: normal, supple, no masses, no thyromegaly.  No evidence of jugular venous distension.   Respiratory: Expiratory wheezing heard in all fields with prolonged expiratory phase, no evidence of rales, patient is tachypneic without accessory muscle use.  Cardiovascular: Regular rate and rhythm, no murmurs / rubs / gallops. No extremity edema. 2+ pedal pulses. No carotid bruits.  Chest:   Nontender without crepitus or deformity.   Back:   Nontender without crepitus or deformity. Abdomen: Abdomen is soft and nontender.  No evidence of intra-abdominal masses.  Positive bowel sounds noted in all quadrants.   Musculoskeletal: No joint deformity upper and lower extremities. Good ROM, no contractures. Normal muscle tone.  Neurologic: CN 2-12 grossly intact. Sensation intact, strength noted to be 5 out of 5 in all 4  extremities.  Patient is  following all commands.  Patient is responsive to verbal stimuli.   Psychiatric: Patient presents as a normal mood with appropriate affect.  Patient seems to possess insight as to theircurrent situation.     Labs on Admission: I have personally reviewed following labs and imaging studies -   CBC: Recent Labs  Lab 12/12/19 0133  WBC 11.8*  HGB 13.3  HCT 41.0  MCV 94.5  PLT 161*   Basic Metabolic Panel: Recent Labs  Lab 12/12/19 0133  NA 137  K 4.7  CL 99  CO2 27  GLUCOSE 144*  BUN 13  CREATININE 1.15  CALCIUM 9.5   GFR: Estimated Creatinine Clearance: 63.7 mL/min (by C-G formula based on SCr of 1.15 mg/dL). Liver Function Tests: Recent Labs  Lab 12/12/19 0133  AST 24  ALT 25  ALKPHOS 45  BILITOT 1.2  PROT 7.2  ALBUMIN 3.9   Recent Labs  Lab 12/12/19 0133  LIPASE 21   No results for input(s): AMMONIA in the last 168 hours. Coagulation Profile: No results for input(s): INR, PROTIME in the last 168 hours. Cardiac Enzymes: No results for input(s): CKTOTAL, CKMB, CKMBINDEX, TROPONINI in the last 168 hours. BNP (last 3 results) No results for input(s): PROBNP in the last 8760 hours. HbA1C: No results for input(s): HGBA1C in the last 72 hours. CBG: Recent Labs  Lab 12/12/19 1513  GLUCAP 179*   Lipid Profile: No results for input(s): CHOL, HDL, LDLCALC, TRIG, CHOLHDL, LDLDIRECT in the last 72 hours. Thyroid Function Tests: No results for input(s): TSH, T4TOTAL, FREET4, T3FREE, THYROIDAB in the last 72 hours. Anemia Panel: No results for input(s): VITAMINB12, FOLATE, FERRITIN, TIBC, IRON, RETICCTPCT in the last 72 hours. Urine analysis:    Component Value Date/Time   COLORURINE YELLOW 12/12/2019 0522   APPEARANCEUR CLEAR 12/12/2019 0522   LABSPEC 1.020 12/12/2019 0522   PHURINE 7.0 12/12/2019 0522   GLUCOSEU 100 (A) 12/12/2019 0522   HGBUR NEGATIVE 12/12/2019 0522   BILIRUBINUR NEGATIVE 12/12/2019 0522   KETONESUR NEGATIVE 12/12/2019 0522    PROTEINUR 100 (A) 12/12/2019 0522   NITRITE NEGATIVE 12/12/2019 0522   LEUKOCYTESUR NEGATIVE 12/12/2019 0522    Radiological Exams on Admission - Personally Reviewed: CT ABDOMEN PELVIS WO CONTRAST  Result Date: 12/12/2019 CLINICAL DATA:  Distension with nausea and vomiting EXAM: CT ABDOMEN AND PELVIS WITHOUT CONTRAST TECHNIQUE: Multidetector CT imaging of the abdomen and pelvis was performed following the standard protocol without IV contrast. COMPARISON:  CT 10/05/2019, 10/16/2019, 09/09/2019, 03/31/2019 FINDINGS: Lower chest: Lung bases demonstrate no acute consolidation or pleural effusion. Partially visualized intracardiac pacing leads. Mild cardiomegaly. Hepatobiliary: No focal liver abnormality is seen. Small calcified gallstones. No biliary dilatation. Pancreas: Unremarkable. No pancreatic ductal dilatation or surrounding inflammatory changes. Spleen: Slightly enlarged measuring 16 cm anterior to posterior. Adrenals/Urinary Tract: Adrenal glands are normal. Atrophic right kidney. No hydronephrosis. Previously demonstrated enhancing lesion in the left kidney is difficult to see without contrast. The bladder is normal. Stomach/Bowel: Stomach is within normal limits. Appendix appears normal. No evidence of bowel wall thickening, distention, or inflammatory changes. Vascular/Lymphatic: Moderate aortic atherosclerosis. No aneurysm. No suspicious nodes Reproductive: Prostate is unremarkable. Other: Negative for free air or free fluid. Left greater than right fat containing inguinal hernias. Small fat containing umbilical hernia. Musculoskeletal: No acute or significant osseous findings. IMPRESSION: 1. No CT evidence for acute intra-abdominal or pelvic abnormality. 2. Gallstones. 3. Atrophic right kidney. Previously demonstrated enhancing lesion in the left kidney is difficult to  see without contrast. Aortic Atherosclerosis (ICD10-I70.0). Electronically Signed   By: Donavan Foil M.D.   On: 12/12/2019 02:49     DG Chest Portable 1 View  Result Date: 12/12/2019 CLINICAL DATA:  Shortness of breath.  Nausea and vomiting EXAM: PORTABLE CHEST 1 VIEW COMPARISON:  Chest radiograph 11/30/2019. Lung bases from abdominal CT earlier today. FINDINGS: Post median sternotomy. Prosthetic cardiac valve and left atrial clipping. Left-sided pacemaker in place. No focal airspace disease. No pleural effusion or pneumothorax. No pulmonary edema. There is mild chronic bronchial thickening. No acute osseous abnormalities are seen. IMPRESSION: No acute abnormality. Mild chronic bronchial thickening. Electronically Signed   By: Keith Rake M.D.   On: 12/12/2019 02:58    EKG: Personally reviewed.  Rhythm is atrial sensed ventricularly paced rhythm with heart rate of 78 bpm.  No dynamic ST segment changes appreciated.  Assessment/Plan Principal Problem:   COPD with acute exacerbation [facility])   Patient presenting with 24-hour history of severe shortness of breath with associated wheezing with increasing frequency of cough with increasing sputum production.  Clinically, with prolonged pressure phase and expiratory wheezing patient appears to be suffering from a COPD exacerbation.    No evidence of focal pulmonary infiltrate on chest x-ray although there is some peribronchial thickening.  This combined with patient's increase sputum production leads to concern for superimposed acute bacterial bronchitis.  COVID-19 testing negative.  Patient patient on doxycycline IV twice daily  Treating patient with intravenous systemic steroids  Aggressive bronchodilator therapy  Patient currently is extremely tachypneic upon arrival to the medical floor and may benefit from temporarily placing the patient on BiPAP to improve work of breathing.  Patient is refusing ABG at this time.  Monitoring closely for symptomatic improvement.  Active Problems:   Acute bacterial bronchitis   Please see assessment and plan above.    PAF  (paroxysmal atrial fibrillation) (HCC)   Continue home regimen of Tikosyn and Eliquis  Monitoring patient on telemetry  Patient is currently rate controlled with a paced rhythm.    Hypothyroidism   Continue home regimen of Synthroid    Controlled type 2 diabetes mellitus with stage 3 chronic kidney disease, without long-term current use of insulin ([place])   Well-controlled diabetes with last hemoglobin A1c in June of 6.6.  Since patient will be on systemic steroids, performing Accu-Cheks before every meal and nightly with sliding scale insulin in case of steroid-induced hyperglycemia.    Chronic kidney disease, stage 3b   Renal function appears to be better than baseline based on today's chemistry.  Strict input and output monitoring  Limiting nephrotoxic agents  Monitor function and electrolytes with serial chemistries.    GERD without esophagitis   Continue home regimen of PPI    Chronic diastolic CHF (congestive heart failure) ([place])    While patient has a history of diastolic congestive heart failure, patient exhibits no clinical evidence of acute congestive heart failure.  While chest x-ray does not reveal any evidence of overt pulmonary edema there is some peribronchial thickening.   We will obtain BNP to compare to his baseline of mid 200s.  BNP is markedly elevated this could suggest that there is some superimposed volume overload.   Code Status:  Full code Family Communication: deferred   Status is: Inpatient  Remains inpatient appropriate because:Ongoing diagnostic testing needed not appropriate for outpatient work up, IV treatments appropriate due to intensity of illness or inability to take PO and Inpatient level of care appropriate due to severity  of illness   Dispo: The patient is from: Home              Anticipated d/c is to: Home              Anticipated d/c date is: 3 days              Patient currently is not medically stable to d/c.          Vernelle Emerald MD [facility] Pager [phone]  If 7PM-7AM, please contact night-coverage [email or web address] Use universal [place] password for that web site. If you do not have the password, please call the hospital operator.  12/13/2019, 12:29 AM

## 2019-12-13 NOTE — Progress Notes (Signed)
  Echocardiogram 2D Echocardiogram has been performed.  [name] 12/13/2019, 11:21 AM

## 2019-12-13 NOTE — Consult Note (Addendum)
Advanced Heart Failure Team Consult Note   Primary Physician: Velna Hatchet, MD PCP-Cardiologist:  No primary care provider on file.  Reason for Consultation: A/C Diastolic Heart Failure   HPI:    [name] is seen today for evaluation of A/C diastolic heart failure  at the request of [name].   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   Admitted 02/02/16 with severe DOE with any exercise or walking 10-20 feet. Pt admitted and planned for [facility] 02/05/16. Diuresed with IV lasix with rapid improvement of symptoms. Overall diuresed 15.2 L and down 14 lbs. Discharge weight was 205 lbs. L/RHC 02/05/16 with no significant CAD. RHC showed significant difference between PCWP (50) and LVEDP (30) suggestive of MS but no MS on Echo. Weight at d/c was 205 pounds.  Admitted 09/2019 with recurrent respiratory failure and lactic acidosis felt to be mostly COPD flare. Had recurrent AF and underwent TEE-DC-CV on 10/19/19. Tikosyn continued.   Presented [facility] with increased sob due to suspected AECOPD. About 72 hours before admit he developed nausea, vomiting, diarrhea and increased shortness of breath. He has been taking all meds and wearing oxygen. Weight at home had been stable to 190-192 pounds. Says he has not had torsemide since 12/10/19.  In the ED he was placed on Bipap and now weaned back to 2 liters.  Lactic acid trending up to 3.9, HS Trop 27, HIV NR, and SARS 2 negative. Also BNP was 611 which was higher than previous BNPs in the 200s. CXR did not show abnormalities. Treating with doxycyline + steroids+ breathing treatments. Today he restarted torsemide and has noticed his urine is much clearer. Echo from today pending.  Feeling much better today. Having joint pain.   Cardiac Studies  [facility] 02/05/16 - Mid RCA lesion, 10 %stenosed. - Ost  Cx to Prox Cx lesion, 20 %stenosed. - LV end diastolic pressure is moderately elevated. - Hemodynamic findings consistent with severe pulmonary hypertension. - No evidence of discordance on the LV/RV tracing. No evidence of pericardial constriction. - Severly elevated PCWP. - PA sat 51%. CO 6.2 L/min. Cardiac index 2.87. - No significant aortic valve gradient. - There is no aortic valve stenosis - RA 30, PWCP 51, CI 6.2, CI 2.8, PVR 3.5   RHC 03/2016  RA = 13 RV = 66/3/15 PA = 69/23 (42) PCW = 21 (v=38) Fick cardiac output/index = 6.3/2.9 PVR = 3.8 WU Ao sat = 87% (radial artery ABG) PA sat = 43%, 43% SVC sat = 50% Review of Systems: [y] = yes, [ ]  = no   . General: Weight gain [ ] ; Weight loss [ ] ; Anorexia [ ] ; Fatigue [Y ]; Fever [ ] ; Chills [ ] ; Weakness [Y ]  . Cardiac: Chest pain/pressure [ ] ; Resting SOB [ ] ; Exertional SOB [ Y]; Orthopnea [ ] ; Pedal Edema [ ] ; Palpitations [ ] ; Syncope [ ] ; Presyncope [ ] ; Paroxysmal nocturnal dyspnea[ ]   . Pulmonary: Cough [ ] ; Wheezing[ ] ; Hemoptysis[ ] ; Sputum [ ] ; Snoring [ ]   . GI: Vomiting[ ] ; Dysphagia[ ] ; Melena[ ] ; Hematochezia [ ] ; Heartburn[ ] ; Abdominal pain [ ] ; Constipation [ ] ; Diarrhea [ ] ; BRBPR [ ]   . GU: Hematuria[ ] ; Dysuria [ ] ; Nocturia[ ]   . Vascular: Pain in legs with walking [ ] ; Pain  in feet with lying flat [ ] ; Non-healing sores [ ] ; Stroke [ ] ; TIA [ ] ; Slurred speech [ ] ;  . Neuro: Headaches[ ] ; Vertigo[ ] ; Seizures[ ] ; Paresthesias[ ] ;Blurred vision [ ] ; Diplopia [ ] ; Vision changes [ ]   . Ortho/Skin: Arthritis [ ] ; Joint pain [Y ]; Muscle pain [ ] ; Joint swelling [ ] ; Back Pain [Y ]; Rash [ ]   . Psych: Depression[ ] ; Anxiety[ ]   . Heme: Bleeding problems [ ] ; Clotting disorders [ ] ; Anemia [ ]   . Endocrine: Diabetes [ ] ; Thyroid dysfunction[ ]   Home Medications Prior to Admission medications   Medication Sig Start Date End Date Taking? Authorizing Provider  albuterol (PROVENTIL HFA;VENTOLIN HFA) 108 (90  Base) MCG/ACT inhaler Inhale 2 puffs into the lungs every 6 (six) hours as needed for wheezing or shortness of breath. 12/28/15  Yes Collene Gobble, MD  amLODipine (NORVASC) 10 MG tablet Take 0.5 tablets (5 mg total) by mouth daily. 09/18/15  Yes Nahser, Wonda Cheng, MD  apixaban (ELIQUIS) 5 MG TABS tablet TAKE 1 TABLET(5 MG) BY MOUTH TWICE DAILY Patient taking differently: Take 5 mg by mouth 2 (two) times daily.  02/25/19  Yes Bensimhon, Shaune Pascal, MD  bisoprolol (ZEBETA) 5 MG tablet Take 1 tablet (5 mg total) by mouth daily. 11/16/19 12/16/19 Yes Bensimhon, Shaune Pascal, MD  Budeson-Glycopyrrol-Formoterol (BREZTRI AEROSPHERE) 160-9-4.8 MCG/ACT AERO Inhale 2 puffs into the lungs 2 (two) times daily. 11/05/19  Yes Martyn Ehrich, NP  Cholecalciferol (VITAMIN D) 2000 UNITS CAPS Take 8,000 Units by mouth at bedtime.    Yes [provider]  dofetilide (TIKOSYN) 250 MCG capsule Take 1 capsule (250 mcg total) by mouth 2 (two) times daily. 11/02/19  Yes Sherran Needs, NP  fexofenadine (ALLEGRA) 180 MG tablet Take 1 tablet (180 mg total) by mouth daily. Patient taking differently: Take 180 mg by mouth daily as needed for allergies or rhinitis.  03/01/14  Yes Collene Gobble, MD  gabapentin (NEURONTIN) 300 MG capsule Take 600-1,200 mg by mouth See admin instructions. Take 2 capsules ( 600 mg) by mouth in the morning and  at lunchtime, and take 4 capsules (1,200 mg) at bedtime 09/13/14  Yes [provider]  HYDROcodone-acetaminophen (NORCO/VICODIN) 5-325 MG tablet Take 1 tablet by mouth 2 (two) times daily as needed (pain).    Yes [provider]  levothyroxine (SYNTHROID, LEVOTHROID) 175 MCG tablet Take 175 mcg by mouth daily before breakfast.   Yes [provider]  MAGNESIUM PO Take 400 mcg by mouth at bedtime. Has zinc/calcium   Yes [provider]  mometasone-formoterol (DULERA) 100-5 MCG/ACT AERO Inhale 2 puffs into the lungs 2 (two) times daily. 10/20/19  Yes Dahal,  Marlowe Aschoff, MD  pantoprazole (PROTONIX) 40 MG tablet Take 40 mg by mouth daily.   Yes [provider]  polyethylene glycol powder (GLYCOLAX/MIRALAX) powder Take 17 g by mouth daily. Patient taking differently: Take 17 g by mouth daily as needed for mild constipation.  05/08/18  Yes Enid Derry, [place], DO  potassium chloride SA (KLOR-CON) 20 MEQ tablet Take 1 tablet (20 mEq total) by mouth 2 (two) times daily. Needs appt for further refills Patient taking differently: Take 20 mEq by mouth See admin instructions. Take one tablet (20 meq) by mouth in the morning and at night on the days that you take torsemide (every other day) 09/06/19  Yes Bensimhon, Shaune Pascal, MD  predniSONE (DELTASONE) 5 MG tablet Take 1 tablet (5 mg total) by mouth daily  with breakfast. 11/05/19  Yes Martyn Ehrich, NP  rOPINIRole (REQUIP) 1 MG tablet Take 1 tablet (1 mg total) by mouth daily. Patient taking differently: Take 4 mg by mouth at bedtime.  12/24/13  Yes Collene Gobble, MD  rosuvastatin (CRESTOR) 10 MG tablet Take 10 mg by mouth at bedtime.  08/08/16  Yes [provider]  spironolactone (ALDACTONE) 25 MG tablet Take 1 tablet (25 mg total) by mouth daily. 02/16/16  Yes Shirley Friar, PA-C  torsemide (DEMADEX) 20 MG tablet TAKE 2 TABLETS BY MOUTH  DAILY Patient taking differently: Take 40 mg by mouth every other day.  12/21/18  Yes Bensimhon, Shaune Pascal, MD  traMADol-acetaminophen (ULTRACET) 37.5-325 MG tablet Take 2 tablets by mouth 2 (two) times daily.   Yes [provider]  traZODone (DESYREL) 50 MG tablet Take 12.5 mg by mouth at bedtime.    Yes [provider]  ondansetron (ZOFRAN ODT) 4 MG disintegrating tablet Take 1 tablet (4 mg total) by mouth every 8 (eight) hours as needed. 12/12/19   Long, Wonda Olds, MD  OXYGEN Inhale 2 L into the lungs continuous.    [provider]    Past Medical History: Past Medical History:  Diagnosis Date  . Aortic stenosis, mild   .  Arthritis of hand    "just a little bit in both hands" (03/31/2012)  . Asthma    "little bit" (03/31/2012)  . Atrial fibrillation [facility])    dx '04; DCCV '04, placed on flecainide, failed DCCV 04/2010, flecainide stopped; s/p successful A.fib ablation 01/31/12  . CHF (congestive heart failure) ([place])   . Controlled type 2 diabetes mellitus without complication, without long-term current use of insulin ([place]) 12/13/2019  . COPD (chronic obstructive pulmonary disease) ([place])   . Diabetes mellitus without complication (HCC)    borderline  . DJD (degenerative joint disease)   . Exertional dyspnea   . Fibromyalgia   . First degree AV block    post ablation, ~415ms  . GERD (gastroesophageal reflux disease)   . Heart murmur   . Hyperlipidemia   . Hypertension   . Hypothyroidism    S/P radiation  . Left atrial enlargement    LA size 69mm by echo 11/21/11  . Mitral regurgitation    trivial  . Obesity   . Obstructive sleep apnea    mild by sleep study 2013; pt stated he does not have a machine because "it wasn't bad enough for him to have one"  . Pacemaker 03/31/2012  . Restless leg syndrome   . Second degree AV block   . Splenomegaly   . Visit for monitoring Tikosyn therapy 04/16/2016    Past Surgical History: Past Surgical History:  Procedure Laterality Date  . AORTIC VALVE REPLACEMENT N/A 08/22/2015   Procedure: AORTIC VALVE REPLACEMENT (AVR);  Surgeon: Ivin Poot, MD;  Location: [place];  Service: Open Heart Surgery;  Laterality: N/A;  . ATRIAL FIBRILLATION ABLATION  01/30/2012   PVI by Victor. Rayann Heman  . ATRIAL FIBRILLATION ABLATION N/A 01/31/2012   Procedure: ATRIAL FIBRILLATION ABLATION;  Surgeon: Thompson Grayer, MD;  Location: [facility] CATH LAB;  Service: Cardiovascular;  Laterality: N/A;  . BACK SURGERY     X 3  . CARDIAC CATHETERIZATION N/A 08/09/2015   Procedure: Right/Left Heart Cath and Coronary Angiography;  Surgeon: Peter Victor [place]ique, MD;  Location: [place] CV LAB;  Service:  Cardiovascular;  Laterality: N/A;  . CARDIAC CATHETERIZATION N/A 02/05/2016   Procedure: Right/Left Heart Cath  and Coronary Angiography;  Surgeon: Jettie Booze, MD;  Location: [place] CV LAB;  Service: Cardiovascular;  Laterality: N/A;  . CARDIAC CATHETERIZATION N/A 04/17/2016   Procedure: Right Heart Cath;  Surgeon: Jolaine Artist, MD;  Location: [place] CV LAB;  Service: Cardiovascular;  Laterality: N/A;  . CARDIOVASCULAR STRESS TEST  03/12/2002   EF 48%, NO EVIDENCE OF ISCHEMIA  . CARDIOVERSION  01/2012; 03/31/2012  . CARDIOVERSION N/A 02/25/2012   Procedure: CARDIOVERSION;  Surgeon: Thompson Grayer, MD;  Location: [facility] CATH LAB;  Service: Cardiovascular;  Laterality: N/A;  . CARDIOVERSION N/A 03/31/2012   Procedure: CARDIOVERSION;  Surgeon: Thompson Grayer, MD;  Location: [facility] CATH LAB;  Service: Cardiovascular;  Laterality: N/A;  . CARDIOVERSION N/A 11/23/2015   Procedure: CARDIOVERSION;  Surgeon: Larey Dresser, MD;  Location: [place];  Service: Cardiovascular;  Laterality: N/A;  . CARDIOVERSION N/A 04/08/2016   Procedure: CARDIOVERSION;  Surgeon: Jolaine Artist, MD;  Location: [facility] ENDOSCOPY;  Service: Cardiovascular;  Laterality: N/A;  . CARDIOVERSION N/A 09/14/2018   Procedure: CARDIOVERSION;  Surgeon: Josue Hector, MD;  Location: [facility] ENDOSCOPY;  Service: Cardiovascular;  Laterality: N/A;  . CARDIOVERSION N/A 10/19/2019   Procedure: CARDIOVERSION;  Surgeon: Jolaine Artist, MD;  Location: [place];  Service: Cardiovascular;  Laterality: N/A;  . CLIPPING OF ATRIAL APPENDAGE N/A 08/22/2015   Procedure: CLIPPING OF ATRIAL APPENDAGE;  Surgeon: Ivin Poot, MD;  Location: [place];  Service: Open Heart Surgery;  Laterality: N/A;  . DOPPLER ECHOCARDIOGRAPHY  03/11/2002   EF 70-75%  . FINGER SURGERY Left    Middle finger  . FINGER TENDON REPAIR  1980's   "right little finger" (03/31/2012)  . INSERT / REPLACE / REMOVE PACEMAKER     [employer] Jude  . IR RADIOLOGIST EVAL &  MGMT  05/06/2019  . IR RADIOLOGIST EVAL & MGMT  10/06/2019  . LEFT AND RIGHT HEART CATHETERIZATION WITH CORONARY ANGIOGRAM N/A 10/27/2012   Procedure: LEFT AND RIGHT HEART CATHETERIZATION WITH CORONARY ANGIOGRAM;  Surgeon: Laverda Page, MD;  Location: [facility] CATH LAB;  Service: Cardiovascular;  Laterality: N/A;  . LUMBAR DISC SURGERY  1980's X2;  2000's  . MAZE N/A 08/22/2015   Procedure: MAZE;  Surgeon: Ivin Poot, MD;  Location: [place];  Service: Open Heart Surgery;  Laterality: N/A;  . PACEMAKER INSERTION  03/31/2012   STJ Accent Victor pacemaker implanted by Victor Rayann Heman  . PERMANENT PACEMAKER INSERTION N/A 03/31/2012   Procedure: PERMANENT PACEMAKER INSERTION;  Surgeon: Thompson Grayer, MD;  Location: [facility] CATH LAB;  Service: Cardiovascular;  Laterality: N/A;  . TEE WITHOUT CARDIOVERSION  01/30/2012   Procedure: TRANSESOPHAGEAL ECHOCARDIOGRAM (TEE);  Surgeon: Larey Dresser, MD;  Location: [place];  Service: Cardiovascular;  Laterality: N/A;  ablation next day  . TEE WITHOUT CARDIOVERSION N/A 08/22/2015   Procedure: TRANSESOPHAGEAL ECHOCARDIOGRAM (TEE);  Surgeon: Ivin Poot, MD;  Location: [place];  Service: Open Heart Surgery;  Laterality: N/A;  . TEE WITHOUT CARDIOVERSION N/A 10/19/2019   Procedure: TRANSESOPHAGEAL ECHOCARDIOGRAM (TEE);  Surgeon: Jolaine Artist, MD;  Location: [facility] ENDOSCOPY;  Service: Cardiovascular;  Laterality: N/A;  . US ECHOCARDIOGRAPHY  08/07/2009   EF 55-60%    Family History: Family History  Problem Relation Age of Onset  . Heart failure Mother   . Stroke Father     Social History: Social History   Socioeconomic History  . Marital status: Married    Spouse name: Not on file  . Number of children: Not  on file  . Years of education: Not on file  . Highest education level: Not on file  Occupational History  . Not on file  Tobacco Use  . Smoking status: Former Smoker    Packs/day: 0.50    Years: 15.00    Pack years: 7.50    Types: Cigarettes    Quit  date: 07/18/1991    Years since quitting: 28.4  . Smokeless tobacco: Never Used  Vaping Use  . Vaping Use: Never used  Substance and Sexual Activity  . Alcohol use: Yes    Comment: occasional  . Drug use: Yes    Types: Marijuana    Comment: 02/2019  . Sexual activity: Not Currently  Other Topics Concern  . Not on file  Social History Narrative   Lives in [place] with spouse.  Works as a Air traffic controller.   Social Determinants of Health   Financial Resource Strain:   . Difficulty of Paying Living Expenses:   Food Insecurity:   . Worried About Charity fundraiser in the Last Year:   . Arboriculturist in the Last Year:   Transportation Needs:   . Film/video editor (Medical):   [name] Lack of Transportation (Non-Medical):   Physical Activity:   . Days of Exercise per Week:   . Minutes of Exercise per Session:   Stress:   . Feeling of Stress :   Social Connections:   . Frequency of Communication with Friends and Family:   . Frequency of Social Gatherings with Friends and Family:   . Attends Religious Services:   . Active Member of Clubs or Organizations:   . Attends Archivist Meetings:   [name] Marital Status:     Allergies:  Allergies  Allergen Reactions  . Meloxicam Rash  . 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 syndrome 09/02/15, resolved with diphenhydramine and slowing of rate    Objective:    Vital Signs:   Temp:  [97.7 F (36.5 C)-98.1 F (36.7 C)] 98.1 F (36.7 C) (08/16 1130) Pulse Rate:  [59-85] 64 (08/16 1130) Resp:  [17-33] 20 (08/16 1130) BP: (125-148)/(69-91) 135/74 (08/16 1130) SpO2:  [96 %-100 %] 100 % (08/16 1130) Weight:  [87.2 kg-87.8 kg] 87.8 kg (08/16 0444) Last BM Date: 12/11/19  Weight change: Filed Weights   12/12/19 0122 12/12/19 2130 12/13/19 0444  Weight: 86.2 kg 87.2 kg 87.8 kg    Intake/Output:   Intake/Output Summary (Last 24 hours) at 12/13/2019 1344 Last data filed at 12/13/2019 1000 Gross per 24 hour   Intake 1014.96 ml  Output 2320 ml  Net -1305.04 ml      Physical Exam    General:   No resp difficulty HEENT: normal Neck: supple. JVP 8-9 . Carotids 2+ bilat; no bruits. No lymphadenopathy or thyromegaly appreciated. Cor: PMI nondisplaced. Regular rate & rhythm. No rubs, gallops or murmurs. Lungs: clear no wheeze. On 2 liters.  Abdomen: soft, nontender, nondistended. No hepatosplenomegaly. No bruits or masses. Good bowel sounds. Extremities: no cyanosis, clubbing, rash, edema Neuro: alert & orientedx3, cranial nerves grossly intact. moves all 4 extremities w/o difficulty. Affect pleasant   Telemetry   A paced 70s  EKG     Labs   Basic Metabolic Panel: Recent Labs  Lab 12/12/19 0133 12/13/19 0327  NA 137 141  K 4.7 4.5  CL 99 105  CO2 27 26  GLUCOSE 144* 172*  BUN 13 18  CREATININE 1.15 1.09  CALCIUM  9.5 9.6  MG  --  1.9    Liver Function Tests: Recent Labs  Lab 12/12/19 0133  AST 24  ALT 25  ALKPHOS 45  BILITOT 1.2  PROT 7.2  ALBUMIN 3.9   Recent Labs  Lab 12/12/19 0133  LIPASE 21   No results for input(s): AMMONIA in the last 168 hours.  CBC: Recent Labs  Lab 12/12/19 0133 12/13/19 0923  WBC 11.8* 12.8*  NEUTROABS  --  12.3*  HGB 13.3 12.8*  HCT 41.0 39.1  MCV 94.5 95.6  PLT 136* 142*    Cardiac Enzymes: No results for input(s): CKTOTAL, CKMB, CKMBINDEX, TROPONINI in the last 168 hours.  BNP: BNP (last 3 results) Recent Labs    10/15/19 0305 10/16/19 1312 12/13/19 0120  BNP 228.2* 243.1* 611.6*    ProBNP (last 3 results) No results for input(s): PROBNP in the last 8760 hours.   CBG: Recent Labs  Lab 12/12/19 1513 12/13/19 0107 12/13/19 0838 12/13/19 1135  GLUCAP 179* 145* 174* 183*    Coagulation Studies: No results for input(s): LABPROT, INR in the last 72 hours.   Imaging   ECHOCARDIOGRAM COMPLETE  Result Date: 12/13/2019    ECHOCARDIOGRAM REPORT   Patient Name:   TREYDON HENRICKS Date of Exam: 12/13/2019  Medical Rec #:  [id number]       Height:       71.0 in Accession #:    [id number]      Weight:       193.6 lb Date of Birth:  July 12, 1950       BSA:          2.079 Victor Patient Age:    4 years        BP:           144/76 mmHg Patient Gender: Victor               HR:           67 bpm. Exam Location:  Inpatient Procedure: 2D Echo, Cardiac Doppler and Color Doppler Indications:    Dyspnea  History:        Patient has prior history of Echocardiogram examinations, most                 recent 11/05/2017. Pacemaker, COPD, Aortic Valve Disease and AVR,                 Arrythmias:Atrial Fibrillation, Signs/Symptoms:Shortness of                 Breath; Risk Factors:Diabetes. Resp. failure.  Sonographer:    Dustin Flock Referring Phys: [id number] Georgina Quint LATIF [place]  1. Left ventricular ejection fraction, by estimation, is 60 to 65%. The left ventricle has normal function. The left ventricle has no regional wall motion abnormalities. There is mild left ventricular hypertrophy. Left ventricular diastolic parameters are indeterminate.  2. Pacing wires in RA/RV. Right ventricular systolic function is normal. The right ventricular size is normal. There is severely elevated pulmonary artery systolic pressure.  3. Left atrial size was moderately dilated.  4. The mitral valve is normal in structure. Mild mitral valve regurgitation. No evidence of mitral stenosis.  5. Tricuspid valve regurgitation is mild to moderate.  6. Normal appearing bioprosthetic AVR with No PvL. The aortic valve has been repaired/replaced. Aortic valve regurgitation is not visualized. No aortic stenosis is present.  7. The inferior vena cava is normal in size with greater than 50% respiratory variability, suggesting right atrial  pressure of 3 mmHg. FINDINGS  Left Ventricle: Left ventricular ejection fraction, by estimation, is 60 to 65%. The left ventricle has normal function. The left ventricle has no regional wall motion abnormalities. The left ventricular  internal cavity size was normal in size. There is  mild left ventricular hypertrophy. Left ventricular diastolic parameters are indeterminate. Right Ventricle: Pacing wires in RA/RV. The right ventricular size is normal. No increase in right ventricular wall thickness. Right ventricular systolic function is normal. There is severely elevated pulmonary artery systolic pressure. The tricuspid regurgitant velocity is 4.53 Victor/s, and with an assumed right atrial pressure of 8 mmHg, the estimated right ventricular systolic pressure is 90.2 mmHg. Left Atrium: Left atrial size was moderately dilated. Right Atrium: Right atrial size was normal in size. Pericardium: There is no evidence of pericardial effusion. Mitral Valve: The mitral valve is normal in structure. There is mild thickening of the mitral valve leaflet(s). There is mild calcification of the mitral valve leaflet(s). Normal mobility of the mitral valve leaflets. Mild mitral valve regurgitation. No evidence of mitral valve stenosis. Tricuspid Valve: The tricuspid valve is normal in structure. Tricuspid valve regurgitation is mild to moderate. No evidence of tricuspid stenosis. 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 The aortic valve has been repaired/replaced. Aortic valve regurgitation is not visualized. No aortic stenosis is present. Aortic valve mean gradient measures 8.0 mmHg. Aortic valve peak gradient measures 13.5 mmHg. Aortic valve area, by VTI measures 2.04 cm. Pulmonic Valve: The pulmonic valve was normal in structure. Pulmonic valve regurgitation is not visualized. No evidence of pulmonic stenosis. Aorta: The aortic root is normal in size and structure. Venous: The inferior vena cava is normal in size with greater than 50% respiratory variability, suggesting right atrial pressure of 3 mmHg. IAS/Shunts: No atrial level shunt detected by color flow Doppler.  LEFT VENTRICLE PLAX 2D LVIDd:         5.10 cm  Diastology LVIDs:          3.10 cm  LV e' lateral:   6.96 cm/s LV PW:         1.10 cm  LV E/e' lateral: 18.7 LV IVS:        1.20 cm  LV e' medial:    5.55 cm/s LVOT diam:     2.00 cm  LV E/e' medial:  23.4 LV SV:         76 LV SV Index:   37 LVOT Area:     3.14 cm  RIGHT VENTRICLE RV Basal diam:  4.00 cm RV S prime:     6.64 cm/s TAPSE (Victor-mode): 2.3 cm LEFT ATRIUM             Index       RIGHT ATRIUM           Index LA diam:        4.70 cm 2.26 cm/Victor  RA Area:     16.70 cm LA Vol (A2C):   60.4 ml 29.05 ml/Victor RA Volume:   40.10 ml  19.28 ml/Victor LA Vol (A4C):   56.4 ml 27.12 ml/Victor LA Biplane Vol: 60.3 ml 29.00 ml/Victor  AORTIC VALVE AV Area (Vmax):    2.12 cm AV Area (Vmean):   2.01 cm AV Area (VTI):     2.04 cm AV Vmax:           184.00 cm/s AV Vmean:          132.000 cm/s AV VTI:  0.374 Victor AV Peak Grad:      13.5 mmHg AV Mean Grad:      8.0 mmHg LVOT Vmax:         124.00 cm/s LVOT Vmean:        84.500 cm/s LVOT VTI:          0.243 Victor LVOT/AV VTI ratio: 0.65  AORTA Ao Root diam: 2.90 cm MITRAL VALVE                TRICUSPID VALVE MV Area (PHT): 4.15 cm     TR Peak grad:   82.1 mmHg MV Decel Time: 183 msec     TR Vmax:        453.00 cm/s MV E velocity: 130.00 cm/s MV A velocity: 36.70 cm/s   SHUNTS MV E/A ratio:  3.54         Systemic VTI:  0.24 Victor                             Systemic Diam: 2.00 cm Jenkins Rouge MD Electronically signed by Jenkins Rouge MD Signature Date/Time: 12/13/2019/11:36:09 AM    Final       Medications:     Current Medications: . amLODipine  5 mg Oral Daily  . apixaban  5 mg Oral BID  . bisoprolol  5 mg Oral Daily  . dofetilide  250 mcg Oral BID  . gabapentin  600 mg Oral BID   And  . gabapentin  1,200 mg Oral QHS  . insulin aspart  0-15 Units Subcutaneous TID AC & HS  . insulin aspart  0-5 Units Subcutaneous QHS  . levothyroxine  175 mcg Oral Q0600  . methylPREDNISolone (SOLU-MEDROL) injection  60 mg Intravenous BID  . multivitamin with minerals  1 tablet Oral Daily  . pantoprazole  40 mg Oral  Daily  . Ensure Max Protein  11 oz Oral BID  . rOPINIRole  4 mg Oral QHS  . rosuvastatin  10 mg Oral QHS  . spironolactone  25 mg Oral Daily  . torsemide  40 mg Oral QODAY  . traMADol-acetaminophen  2 tablet Oral BID     Infusions: . sodium chloride Stopped (12/12/19 1055)  . sodium chloride    . doxycycline (VIBRAMYCIN) IV 100 mg (12/13/19 1224)  . sodium chloride         Assessment/Plan   1. A/C Respiratory Failure suspect in the setting AECOPD Initially on Bipap. Now back on 2 liters.  - SARs 2 negative.  - Blood Cx pending. NGTD - WBC 12.8  -Treating with antibiotics, steroids, breathing treatments.   2. Elevated Lactic Acid Lactic Acid 2.5>2.6>2.8> 3.9. Suspect elevated in the setting hypoperfusion with acute respiratory failure/AECOPD.  -Hold diuretics. BP stable. Sats stable.   3. Chronic Diastolic HF with RV failure TEE 6/21 EF 60% RV moderately HK septal flattening RVSP 90s - Repeat ECHO pending.  -On exam he does not appear volume overloaded. BNP higher than his baseline.  Had torsemide today. Will hold diuretics for now.   - Renal  function stable.   4. PAF In SR today. Continue tikosyn + bisoprolol - Continue apixaban.    5. Pulmonary HTN - suspect pulmonary venous hypertension.  [place] in 10/17 with pulmonary venous HTN primarily. No change. - TEE 6/21 EF 60% RV moderately HK septal flattening RVSP 90s in setting of COPD     Length of Stay: [place], NP  12/13/2019, 1:44 PM  Advanced Heart Failure Team Pager [phone] (Victor-F; 7a - 4p)  Please contact [facility] for night-coverage after hours (4p -7a ) and weekends on [email or web address]  Patient seen and examined with the above-signed Advanced Practice Provider and/or Housestaff. I personally reviewed laboratory data, imaging studies and relevant notes. I independently examined the patient and formulated the important aspects of the plan. I have edited the note to reflect any of my changes or salient  points. I have personally discussed the plan with the patient and/or family.  69 y/o male with COPD, chronic diastolic/RV failure, s/p AVR, PAF admitted with respiratory failure and lactic acidosis presumed secondary to COPD flare. Now improved with abx, steroids and nebs. Volume status looks good. Trop negative. Maintaining NSR on tikosyn.   General:  Sitting up in bed  No resp difficulty HEENT: normal Neck: supple. no JVD. Carotids 2+ bilat; no bruits. No lymphadenopathy or thryomegaly appreciated. Cor: PMI nondisplaced. Regular rate & rhythm. 2/6 SEM Lungs: clear but decreased  Abdomen: soft, nontender, nondistended. No hepatosplenomegaly. No bruits or masses. Good bowel sounds. Extremities: no cyanosis, clubbing, rash, edema Neuro: alert & orientedx3, cranial nerves grossly intact. moves all 4 extremities w/o difficulty. Affect pleasant  He is much improved. Suspect majority of symptoms related to AECOPD. Echo reviewed personally EF 60%. RV ok Agree with resuming home diuretic regimen. Continue tikosyn & apixaban. We will s/o please call with questions.   Glori Bickers, MD  11:18 PM

## 2019-12-13 NOTE — Progress Notes (Signed)
OT Cancellation Note  Patient Details Name: [name] MRN: [id number] DOB: 1950/11/05   Cancelled Treatment:    Reason Eval/Treat Not Completed: Patient at procedure or test/ unavailable (Echo), will follow up for OT eval as able.  Lou Cal, OT Acute Rehabilitation Services Pager [phone] Office [phone]   [name] 12/13/2019, 11:08 AM

## 2019-12-13 NOTE — Evaluation (Signed)
Physical Therapy Evaluation Patient Details Name: ZAKEE DEERMAN MRN: [id number] DOB: [birth year] Today's Date: 12/13/2019   History of Present Illness  69 year old male with past medical history of paroxysmal atrial fibrillation (on Tikosyn/Eliquis), status post pacemaker, COPD, chronic respiratory failure ( 2 LPM via [place] at all times), chronic kidney disease stage IIIb, diabetes mellitus type 2, hypothyroidism who presents to [facility] emergency department complaints of shortness of breath. Admitting diagnoses of COPD exacerbation, pna, bronchitis  Clinical Impression  Patient evaluated by Physical Therapy with no further acute PT needs identified. All education has been completed and the patient has no further questions.  See below for any follow-up Physical Therapy or equipment needs. PT is signing off. Thank you for this referral.     Follow Up Recommendations No PT follow up    Equipment Recommendations  None recommended by PT    Recommendations for Other Services       Precautions / Restrictions Precautions Precautions: None      Mobility  Bed Mobility Overal bed mobility: Independent                Transfers Overall transfer level: Independent                  Ambulation/Gait Ambulation/Gait assistance: Independent Gait Distance (Feet): 320 Feet Assistive device: None Gait Pattern/deviations: WFL(Within Functional Limits)     General Gait Details: Pt able to amb without assistance, only required assist for IV pole. Pt o2 remianed >92% on 2L o2 with amb  Stairs            Wheelchair Mobility    Modified Rankin (Stroke Patients Only)       Balance Overall balance assessment: No apparent balance deficits (not formally assessed)                                           Pertinent Vitals/Pain Pain Assessment: No/denies pain    Home Living Family/patient expects to be discharged to:: Private  residence Living Arrangements: Spouse/significant other Available Help at Discharge: Family;Available PRN/intermittently Type of Home: House Home Access: Stairs to enter;Ramped entrance Entrance Stairs-Rails: Can reach both Entrance Stairs-Number of Steps: 5 Home Layout: One level Home Equipment: None Additional Comments: Uses supplemental O2, 2L at baseline    Prior Function Level of Independence: Independent         Comments: Likes to fish, play with dogs and be outdoors, is retired     Journalist, newspaper        Extremity/Trunk Assessment   Upper Extremity Assessment Upper Extremity Assessment: Overall WFL for tasks assessed    Lower Extremity Assessment Lower Extremity Assessment: Overall WFL for tasks assessed       Communication   Communication: No difficulties  Cognition Arousal/Alertness: Awake/alert Behavior During Therapy: WFL for tasks assessed/performed Overall Cognitive Status: Within Functional Limits for tasks assessed                                        General Comments      Exercises     Assessment/Plan    PT Assessment Patent does not need any further PT services  PT Problem List         PT Treatment Interventions      PT  Goals (Current goals can be found in the Care Plan section)  Acute Rehab PT Goals Patient Stated Goal: Hopes to be home soon PT Goal Formulation: All assessment and education complete, DC therapy    Frequency     Barriers to discharge        Co-evaluation               AM-PAC PT "6 Clicks" Mobility  Outcome Measure Help needed turning from your back to your side while in a flat bed without using bedrails?: None Help needed moving from lying on your back to sitting on the side of a flat bed without using bedrails?: None Help needed moving to and from a bed to a chair (including a wheelchair)?: None Help needed standing up from a chair using your arms (e.g., wheelchair or bedside chair)?:  None Help needed to walk in hospital room?: None Help needed climbing 3-5 steps with a railing? : None 6 Click Score: 24    End of Session Equipment Utilized During Treatment: Oxygen;Gait belt Activity Tolerance: Patient tolerated treatment well Patient left: in bed;with family/visitor present;with call bell/phone within reach Nurse Communication: Mobility status PT Visit Diagnosis: Other abnormalities of gait and mobility (R26.89)    Time: 1359-1416 PT Time Calculation (min) (ACUTE ONLY): 17 min   Charges:   PT Evaluation $PT Eval Low Complexity: [place], PT  Acute Rehabilitation Services Pager [phone] Office [phone]   Colletta [place] 12/13/2019, 5:20 PM

## 2019-12-13 NOTE — Progress Notes (Signed)
Pt bipap order s now PRN.  RT removed V60 from room at this time.  Pt aware to let staff know of any SOB or resp distress and machine can be brought back if needed.  RT will continue to monitor.

## 2019-12-13 NOTE — Progress Notes (Signed)
Patient stated that he was feeling nauseous, minimal clear fluid noted; coughing with brown secretions as well. [place], MD paged as patient requested medicine for nausea relief. Awaiting orders and will continue to monitor.   Elaina Hoops, RN

## 2019-12-14 ENCOUNTER — Telehealth (HOSPITAL_COMMUNITY): Payer: Self-pay | Admitting: Licensed Clinical Social Worker

## 2019-12-14 LAB — CBC WITH DIFFERENTIAL/PLATELET
Abs Immature Granulocytes: 0.12 10*3/uL — ABNORMAL HIGH (ref 0.00–0.07)
Basophils Absolute: 0 10*3/uL (ref 0.0–0.1)
Basophils Relative: 0 %
Eosinophils Absolute: 0 10*3/uL (ref 0.0–0.5)
Eosinophils Relative: 0 %
HCT: 36.6 % — ABNORMAL LOW (ref 39.0–52.0)
Hemoglobin: 11.9 g/dL — ABNORMAL LOW (ref 13.0–17.0)
Immature Granulocytes: 1 %
Lymphocytes Relative: 3 %
Lymphs Abs: 0.3 10*3/uL — ABNORMAL LOW (ref 0.7–4.0)
MCH: 30.2 pg (ref 26.0–34.0)
MCHC: 32.5 g/dL (ref 30.0–36.0)
MCV: 92.9 fL (ref 80.0–100.0)
Monocytes Absolute: 0.3 10*3/uL (ref 0.1–1.0)
Monocytes Relative: 3 %
Neutro Abs: 10.5 10*3/uL — ABNORMAL HIGH (ref 1.7–7.7)
Neutrophils Relative %: 93 %
Platelets: 137 10*3/uL — ABNORMAL LOW (ref 150–400)
RBC: 3.94 MIL/uL — ABNORMAL LOW (ref 4.22–5.81)
RDW: 13.7 % (ref 11.5–15.5)
WBC: 11.3 10*3/uL — ABNORMAL HIGH (ref 4.0–10.5)
nRBC: 0 % (ref 0.0–0.2)

## 2019-12-14 LAB — LACTIC ACID, PLASMA
Lactic Acid, Venous: 2.6 mmol/L (ref 0.5–1.9)
Lactic Acid, Venous: 1.7 mmol/L (ref 0.5–1.9)

## 2019-12-14 LAB — COMPREHENSIVE METABOLIC PANEL
ALT: 19 U/L (ref 0–44)
AST: 16 U/L (ref 15–41)
Albumin: 3.1 g/dL — ABNORMAL LOW (ref 3.5–5.0)
Alkaline Phosphatase: 36 U/L — ABNORMAL LOW (ref 38–126)
Anion gap: 9 (ref 5–15)
BUN: 35 mg/dL — ABNORMAL HIGH (ref 8–23)
CO2: 30 mmol/L (ref 22–32)
Calcium: 9.2 mg/dL (ref 8.9–10.3)
Chloride: 97 mmol/L — ABNORMAL LOW (ref 98–111)
Creatinine, Ser: 1.26 mg/dL — ABNORMAL HIGH (ref 0.61–1.24)
GFR calc Af Amer: >60 mL/min (ref >60)
GFR calc non Af Amer: 57 mL/min — ABNORMAL LOW (ref >60)
Glucose, Bld: 166 mg/dL — ABNORMAL HIGH (ref 70–99)
Potassium: 4.3 mmol/L (ref 3.5–5.1)
Sodium: 136 mmol/L (ref 135–145)
Total Bilirubin: 0.7 mg/dL (ref 0.3–1.2)
Total Protein: 6.1 g/dL — ABNORMAL LOW (ref 6.5–8.1)

## 2019-12-14 LAB — PHOSPHORUS: Phosphorus: 4.3 mg/dL (ref 2.5–4.6)

## 2019-12-14 LAB — GLUCOSE, CAPILLARY
Glucose-Capillary: 196 mg/dL — ABNORMAL HIGH (ref 70–99)
Glucose-Capillary: 155 mg/dL — ABNORMAL HIGH (ref 70–99)

## 2019-12-14 LAB — MAGNESIUM: Magnesium: 2.1 mg/dL (ref 1.7–2.4)

## 2019-12-14 LAB — PROCALCITONIN: Procalcitonin: 0.76 ng/mL

## 2019-12-14 MED ORDER — SODIUM CHLORIDE 0.9 % IV BOLUS
250.0000 mL | Freq: Once | INTRAVENOUS | Status: AC
  Administered 2019-12-14: 250 mL via INTRAVENOUS

## 2019-12-14 MED ORDER — ENSURE MAX PROTEIN PO LIQD: 11.0000 [oz_av] | Freq: Two times a day (BID) | ORAL | 0 refills | Status: DC

## 2019-12-14 MED ORDER — PREDNISONE 10 MG (21) PO TBPK
ORAL_TABLET | ORAL | 0 refills | Status: DC
Start: 2019-12-14 — End: 2020-01-10

## 2019-12-14 MED ORDER — GUAIFENESIN ER 600 MG PO TB12: 600.0000 mg | ORAL_TABLET | Freq: Two times a day (BID) | ORAL | 0 refills | Status: AC

## 2019-12-14 MED ORDER — ADULT MULTIVITAMIN W/MINERALS CH: 1.0000 | ORAL_TABLET | Freq: Every day | ORAL | 0 refills | Status: AC

## 2019-12-14 MED ORDER — PREDNISONE 5 MG PO TABS
5.0000 mg | ORAL_TABLET | Freq: Every day | ORAL | 3 refills | Status: DC
Start: 2019-12-21 — End: 2020-07-19

## 2019-12-14 MED ORDER — DOXYCYCLINE MONOHYDRATE 100 MG PO TABS: 100.0000 mg | ORAL_TABLET | Freq: Two times a day (BID) | ORAL | 0 refills | Status: AC

## 2019-12-14 MED ORDER — GUAIFENESIN ER 600 MG PO TB12
1200.0000 mg | ORAL_TABLET | Freq: Two times a day (BID) | ORAL | Status: DC
  Administered 2019-12-14: 1200 mg via ORAL
  Filled 2019-12-14 (×2): qty 2

## 2019-12-14 NOTE — Discharge Summary (Signed)
Physician Discharge Summary  [name] WUJ:[id number] DOB: 03-23-1951 DOA: 12/12/2019  PCP: Velna Hatchet, MD  Admit date: 12/12/2019 Discharge date: 12/14/2019  Admitted From: Home Disposition: Home  Recommendations for Outpatient Follow-up:  1. Follow up with PCP in 1-2 weeks 2. Follow up with Cardiology (HF Team) within 1-2 weeks 3. Follow up with Pulmonary Dr. Lamonte Sakai within 1-2 weeks 4. Please obtain CMP/CBC, Mag, Phos in one week 5. Repeat CXR in 3-6 weeks 6. Please follow up on the following pending results:  Home Health: No Equipment/Devices: None    Discharge Condition: Stable  CODE STATUS: FULL CODE Diet recommendation: Heart Healthy Carb Modified Diet with 1500 mL Fluid Restriction  Brief/Interim Summary: The patient is a 69 year old Caucasian male with a past medical history significant for but not limited to proximal afibrillation status post AF ablation/maze status post pacemaker followed by Dr. 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  Patient states that Saturday morning he started developing shortness of breath which was associated with wheezing.  He also noticed change in frequency of his chronic cough and increasing amount of sputum production as well as change in the color of the sputum.  He denies any chest pain.  He also had loss of appetite and generalized weakness and stated that when he tries to eat he started having dry heaving's and also complained of some generalized abdominal pain but states it is lower down in his abdomen.  Is currently being treated for COPD exacerbation and was  started on IV doxycycline and systemic steroids along with his aggressive bronchodilator therapy.  Patient refused ABG and ended up temporarily being placed on BiPAP.  Currently being treated for an acute bacterial bronchitis and he does have a history of PAF status post A. fib ablation/maze procedure status post pacemaker and he is currently on home Tikosyn as well as Eliquis.  Patient also has CKD stage IIIb and also has GERD with esophagitis.  Patient has a significant history of chronic grade 3 diastolic heart failure with right ventricular failure as well as pulmonary hypertension and he did have an elevated BNP on admission greater than his baseline 200s.  We will consult the heart failure team for further recommendations.  **Interim History Heart failure team evaluated and felt that his symptoms are related to his COPD exacerbation not related to heart failure. He was treated for COPD exacerbation and he had an elevated lactic acid level and was given IV fluid hydration with improvement. His respiratory symptoms improved significantly and he was deemed stable for discharge and he will be transition to p.o. prednisone taper and will be sent home on p.o. antibiotics. He was told to follow-up with PCP, cardiology as well as pulmonary in the outpatient setting and he is understandable agreeable with the plan of care.  Discharge Diagnoses:  Principal Problem:   COPD with acute exacerbation ([place]) Active Problems:   PAF (paroxysmal atrial fibrillation) (HCC)   GERD without esophagitis   Chronic diastolic CHF (congestive heart failure) (HCC)   Hypothyroidism   Controlled type 2 diabetes mellitus with stage 3 chronic kidney disease, without long-term current use of insulin (HCC)   Chronic kidney disease,  stage 3b   Acute bacterial bronchitis   COPD exacerbation (HCC)  COPD with acute exacerbation ([place]) -Patient presenting with 24-hour history of severe shortness of breath with associated wheezing  with increasing frequency of cough with increasing sputum production. -Clinically, with prolonged pressure phase and expiratory wheezing patient appears to be suffering from a COPD exacerbation.  -No evidence of focal pulmonary infiltrate on chest x-ray although there is some peribronchial thickening. This combined with patient's increase sputum production leads to concern for superimposed acute bacterial bronchitis. -SARS CoV-2 Testing COVID-19 testing negative. -Patient patient on doxycycline IV twice daily and change to po -Treating patient with intravenous systemic steroids and change to po Prednisone Taper and resume 5 mg po Prednisone at D/C  -Aggressive bronchodilator therapy -Patient currently is extremely tachypneic upon arrival to the medical floor and may benefit from temporarily placing the patient on BiPAP to improve work of breathing. -Patient is refusing ABG at this time. -Monitoring closely for symptomatic improvement. -Patient improved -Follow up with PCP and Pulmonary as an outpatient  -WBC went 11.8 -> 12.8 -> 11.3 -Repeat CBC within 1-2 weeks   Acute Bacterial Bronchitis -Please see assessment and plan above.  PAF (paroxysmal atrial fibrillation) (HCC) s/p AF Ablation/Maze Procedure s/p Pacemaker -Continue home regimen of Tikosyn and Eliquis -Monitoring patient on telemetry -Patient is currently rate controlled with a paced rhythm. -Follow up with Cardiology as an outpatient   [place] at 3.9 and given IVF Hydration and improved to 1.7 -PCT is 0.93 -> 0.76 -C/w Abx -Follow up with PCP as an outpatient   Hypothyroidism -Continue home regimen of Synthroid  Controlled type 2 diabetes mellitus with stage 3 chronic kidney disease, without long-term current use of insulin ([place]) -Well-controlled diabetes with last hemoglobin A1c in June of 6.6. -Since patient will be on systemic steroids, performing Accu-Cheks before every meal and nightly with sliding scale  insulin in case of steroid-induced hyperglycemia. -CBG's ranging from 155-196 and expect to increase from Steroids -Follow up as an outpatient   Chronic Kidney Disease, stage 3b -Renal function appears to be better than baseline based on today's chemistry. -Strict input and output monitoring -Limiting nephrotoxic agents -Monitor function and electrolytes with serial chemistries. -BUN/Cr is now 35/1.26 -Given IVF Hydration and now stopped -Repeat CMP within 1 week   GERD without esophagitis -Continue home regimen of PPI  Chronic Grade 3 Diastolic CHF (congestive heart failure) (HCC) with RV Failure  -While patient has a history of diastolic congestive heart failure, patient exhibits no clinical evidence of acute congestive heart failure. -While chest x-ray does not reveal any evidence of overt pulmonary edema there is some peribronchial thickening.  -We will obtain BNP to compare to his baseline of mid 200s. BNP is markedly elevated this could suggest that there is some superimposed volume overload. -Repeated ECHO and as below -Will consult Cardiology for further evaluation and recommendations; Cardiology does not feel he is volume overloaded and recommended continuing Diuretics -Cardiology signed off and will follow up within 1-2 weeks   Discharge Instructions  Discharge Instructions    Call MD for:  difficulty breathing, headache or visual disturbances   Complete by: As directed    Call MD for:  extreme fatigue   Complete by: As directed    Call MD for:  hives   Complete by: As directed    Call MD for:  persistant dizziness or light-headedness   Complete by: As directed    Call MD for:  persistant nausea and  vomiting   Complete by: As directed    Call MD for:  redness, tenderness, or signs of infection (pain, swelling, redness, odor or green/yellow discharge around incision site)   Complete by: As directed    Call MD for:  severe uncontrolled pain   Complete by: As  directed    Call MD for:  temperature >100.4   Complete by: As directed    Diet - low sodium heart healthy   Complete by: As directed    Diet Carb Modified   Complete by: As directed    Discharge instructions   Complete by: As directed    You were cared for by a hospitalist during your hospital stay. If you have any questions about your discharge medications or the care you received while you were in the hospital after you are discharged, you can call the unit and ask to speak with the hospitalist on call if the hospitalist that took care of you is not available. Once you are discharged, your primary care physician will handle any further medical issues. Please note that NO REFILLS for any discharge medications will be authorized once you are discharged, as it is imperative that you return to your primary care physician (or establish a relationship with a primary care physician if you do not have one) for your aftercare needs so that they can reassess your need for medications and monitor your lab values.  Follow up with PCP, Cardiology, and Pulmonary in the outpatient setting. Take all medications as prescribed. If symptoms change or worsen please return to the ED for evaluation   Increase activity slowly   Complete by: As directed      Allergies as of 12/14/2019      Reactions   Meloxicam Rash   Vancomycin Other (See Comments)   Red Man's syndrome 09/02/15, resolved with diphenhydramine and slowing of rate      Medication List    TAKE these medications   albuterol 108 (90 Base) MCG/ACT inhaler Commonly known as: VENTOLIN HFA Inhale 2 puffs into the lungs every 6 (six) hours as needed for wheezing or shortness of breath.   amLODipine 10 MG tablet Commonly known as: NORVASC Take 0.5 tablets (5 mg total) by mouth daily.   apixaban 5 MG Tabs tablet Commonly known as: Eliquis TAKE 1 TABLET(5 MG) BY MOUTH TWICE DAILY What changed:   how much to take  how to take this  when to  take this  additional instructions   bisoprolol 5 MG tablet Commonly known as: ZEBETA Take 1 tablet (5 mg total) by mouth daily.   Breztri Aerosphere 160-9-4.8 MCG/ACT Aero Generic drug: Budeson-Glycopyrrol-Formoterol Inhale 2 puffs into the lungs 2 (two) times daily.   dofetilide 250 MCG capsule Commonly known as: TIKOSYN Take 1 capsule (250 mcg total) by mouth 2 (two) times daily.   doxycycline 100 MG tablet Commonly known as: ADOXA Take 1 tablet (100 mg total) by mouth 2 (two) times daily for 4 days.   Ensure Max Protein Liqd Take 330 mLs (11 oz total) by mouth 2 (two) times daily.   fexofenadine 180 MG tablet Commonly known as: ALLEGRA Take 1 tablet (180 mg total) by mouth daily. What changed:   when to take this  reasons to take this   gabapentin 300 MG capsule Commonly known as: NEURONTIN Take 600-1,200 mg by mouth See admin instructions. Take 2 capsules ( 600 mg) by mouth in the morning and  at lunchtime, and take 4 capsules (1,200  mg) at bedtime   guaiFENesin 600 MG 12 hr tablet Commonly known as: MUCINEX Take 1 tablet (600 mg total) by mouth 2 (two) times daily for 5 days.   HYDROcodone-acetaminophen 5-325 MG tablet Commonly known as: NORCO/VICODIN Take 1 tablet by mouth 2 (two) times daily as needed (pain).   levothyroxine 175 MCG tablet Commonly known as: SYNTHROID Take 175 mcg by mouth daily before breakfast.   MAGNESIUM PO Take 400 mcg by mouth at bedtime. Has zinc/calcium   mometasone-formoterol 100-5 MCG/ACT Aero Commonly known as: DULERA Inhale 2 puffs into the lungs 2 (two) times daily.   multivitamin with minerals Tabs tablet Take 1 tablet by mouth daily. Start taking on: December 15, 2019   ondansetron 4 MG disintegrating tablet Commonly known as: Zofran ODT Take 1 tablet (4 mg total) by mouth every 8 (eight) hours as needed. What changed: reasons to take this   OXYGEN Inhale 2 L into the lungs continuous.   pantoprazole 40 MG  tablet Commonly known as: PROTONIX Take 40 mg by mouth daily.   polyethylene glycol powder 17 GM/SCOOP powder Commonly known as: GLYCOLAX/MIRALAX Take 17 g by mouth daily. What changed:   when to take this  reasons to take this   potassium chloride SA 20 MEQ tablet Commonly known as: KLOR-CON Take 1 tablet (20 mEq total) by mouth 2 (two) times daily. Needs appt for further refills What changed:   when to take this  additional instructions   predniSONE 10 MG (21) Tbpk tablet Commonly known as: STERAPRED UNI-PAK 21 TAB Take 6 pills on day 1, 5 pills on day 2, 4 pills on day 3, 3 pills on day 4, 2 pills on day 5, and 1 pill on day 6 and then resume HOME 5 mg prednisone daily after that What changed: You were already taking a medication with the same name, and this prescription was added. Make sure you understand how and when to take each.   predniSONE 5 MG tablet Commonly known as: DELTASONE Take 1 tablet (5 mg total) by mouth daily with breakfast. Start taking on: December 21, 2019 What changed: These instructions start on December 21, 2019. If you are unsure what to do until then, ask your doctor or other care provider.   rOPINIRole 1 MG tablet Commonly known as: REQUIP Take 1 tablet (1 mg total) by mouth daily. What changed:   how much to take  when to take this   rosuvastatin 10 MG tablet Commonly known as: CRESTOR Take 10 mg by mouth at bedtime.   spironolactone 25 MG tablet Commonly known as: ALDACTONE Take 1 tablet (25 mg total) by mouth daily.   torsemide 20 MG tablet Commonly known as: DEMADEX TAKE 2 TABLETS BY MOUTH  DAILY What changed: when to take this   traMADol-acetaminophen 37.5-325 MG tablet Commonly known as: ULTRACET Take 2 tablets by mouth 2 (two) times daily.   traZODone 50 MG tablet Commonly known as: DESYREL Take 12.5 mg by mouth at bedtime.   Vitamin D 50 MCG (2000 UT) Caps Take 8,000 Units by mouth at bedtime.       Follow-up  Information    Velna Hatchet, MD. Call in 1 day.   Specialty: Internal Medicine Why: to schedule a follow up appointment Contact information: [place] [address] [phone]        [place].   Specialty: Emergency Medicine Why: If symptoms worsen Contact information: [place] [id number]  [place] [id number] [phone]             Allergies  Allergen Reactions  . Meloxicam Rash  . 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 syndrome 09/02/15, resolved with diphenhydramine and slowing of rate   Consultations:  Cardiology  Procedures/Studies: CT ABDOMEN PELVIS WO CONTRAST  Result Date: 12/12/2019 CLINICAL DATA:  Distension with nausea and vomiting EXAM: CT ABDOMEN AND PELVIS WITHOUT CONTRAST TECHNIQUE: Multidetector CT imaging of the abdomen and pelvis was performed following the standard protocol without IV contrast. COMPARISON:  CT 10/05/2019, 10/16/2019, 09/09/2019, 03/31/2019 FINDINGS: Lower chest: Lung bases demonstrate no acute consolidation or pleural effusion. Partially visualized intracardiac pacing leads. Mild cardiomegaly. Hepatobiliary: No focal liver abnormality is seen. Small calcified gallstones. No biliary dilatation. Pancreas: Unremarkable. No pancreatic ductal dilatation or surrounding inflammatory changes. Spleen: Slightly enlarged measuring 16 cm anterior to posterior. Adrenals/Urinary Tract: Adrenal glands are normal. Atrophic right kidney. No hydronephrosis. Previously demonstrated enhancing lesion in the left kidney is difficult to see without contrast. The bladder is normal. Stomach/Bowel: Stomach is within normal limits. Appendix appears normal. No evidence of bowel wall thickening, distention, or inflammatory changes. Vascular/Lymphatic: Moderate aortic atherosclerosis. No aneurysm. No suspicious nodes Reproductive: Prostate is unremarkable. Other: Negative for free air  or free fluid. Left greater than right fat containing inguinal hernias. Small fat containing umbilical hernia. Musculoskeletal: No acute or significant osseous findings. IMPRESSION: 1. No CT evidence for acute intra-abdominal or pelvic abnormality. 2. Gallstones. 3. Atrophic right kidney. Previously demonstrated enhancing lesion in the left kidney is difficult to see without contrast. Aortic Atherosclerosis (ICD10-I70.0). Electronically Signed   By: Donavan Foil M.D.   On: 12/12/2019 02:49   DG Chest 2 View  Result Date: 11/30/2019 CLINICAL DATA:  Shortness of breath EXAM: CHEST - 2 VIEW COMPARISON:  October 16, 2019 FINDINGS: The cardiomediastinal silhouette is unchanged in contour.Status post median sternotomy and aortic valve replacement. LEFT chest cardiac pacing device. Atrial appendage clip. Atherosclerotic calcifications of the aorta. No pleural effusion. No pneumothorax. Unchanged diffuse interstitial prominence. No acute pleuroparenchymal abnormality. Visualized abdomen is unremarkable. Multilevel degenerative changes of the thoracic spine. IMPRESSION: No acute cardiopulmonary abnormality. Electronically Signed   By: Valentino Saxon MD   On: 11/30/2019 16:46   DG Chest Portable 1 View  Result Date: 12/12/2019 CLINICAL DATA:  Shortness of breath.  Nausea and vomiting EXAM: PORTABLE CHEST 1 VIEW COMPARISON:  Chest radiograph 11/30/2019. Lung bases from abdominal CT earlier today. FINDINGS: Post median sternotomy. Prosthetic cardiac valve and left atrial clipping. Left-sided pacemaker in place. No focal airspace disease. No pleural effusion or pneumothorax. No pulmonary edema. There is mild chronic bronchial thickening. No acute osseous abnormalities are seen. IMPRESSION: No acute abnormality. Mild chronic bronchial thickening. Electronically Signed   By: Keith Rake M.D.   On: 12/12/2019 02:58   ECHOCARDIOGRAM COMPLETE  Result Date: 12/13/2019    ECHOCARDIOGRAM REPORT   Patient Name:   IZRAEL PEAK Date of Exam: 12/13/2019 Medical Rec #:  [id number]       Height:       71.0 in Accession #:    [id number]      Weight:       193.6 lb Date of Birth:  1950/08/23       BSA:          2.079 m Patient Age:    52 years        BP:  144/76 mmHg Patient Gender: M               HR:           67 bpm. Exam Location:  Inpatient Procedure: 2D Echo, Cardiac Doppler and Color Doppler Indications:    Dyspnea  History:        Patient has prior history of Echocardiogram examinations, most                 recent 11/05/2017. Pacemaker, COPD, Aortic Valve Disease and AVR,                 Arrythmias:Atrial Fibrillation, Signs/Symptoms:Shortness of                 Breath; Risk Factors:Diabetes. Resp. failure.  Sonographer:    Dustin Flock Referring Phys: [id number] Georgina Quint LATIF [place]  1. Left ventricular ejection fraction, by estimation, is 60 to 65%. The left ventricle has normal function. The left ventricle has no regional wall motion abnormalities. There is mild left ventricular hypertrophy. Left ventricular diastolic parameters are indeterminate.  2. Pacing wires in RA/RV. Right ventricular systolic function is normal. The right ventricular size is normal. There is severely elevated pulmonary artery systolic pressure.  3. Left atrial size was moderately dilated.  4. The mitral valve is normal in structure. Mild mitral valve regurgitation. No evidence of mitral stenosis.  5. Tricuspid valve regurgitation is mild to moderate.  6. Normal appearing bioprosthetic AVR with No PvL. The aortic valve has been repaired/replaced. Aortic valve regurgitation is not visualized. No aortic stenosis is present.  7. The inferior vena cava is normal in size with greater than 50% respiratory variability, suggesting right atrial pressure of 3 mmHg. FINDINGS  Left Ventricle: Left ventricular ejection fraction, by estimation, is 60 to 65%. The left ventricle has normal function. The left ventricle has no regional wall motion  abnormalities. The left ventricular internal cavity size was normal in size. There is  mild left ventricular hypertrophy. Left ventricular diastolic parameters are indeterminate. Right Ventricle: Pacing wires in RA/RV. The right ventricular size is normal. No increase in right ventricular wall thickness. Right ventricular systolic function is normal. There is severely elevated pulmonary artery systolic pressure. The tricuspid regurgitant velocity is 4.53 m/s, and with an assumed right atrial pressure of 8 mmHg, the estimated right ventricular systolic pressure is 37.8 mmHg. Left Atrium: Left atrial size was moderately dilated. Right Atrium: Right atrial size was normal in size. Pericardium: There is no evidence of pericardial effusion. Mitral Valve: The mitral valve is normal in structure. There is mild thickening of the mitral valve leaflet(s). There is mild calcification of the mitral valve leaflet(s). Normal mobility of the mitral valve leaflets. Mild mitral valve regurgitation. No evidence of mitral valve stenosis. Tricuspid Valve: The tricuspid valve is normal in structure. Tricuspid valve regurgitation is mild to moderate. No evidence of tricuspid stenosis. 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 The aortic valve has been repaired/replaced. Aortic valve regurgitation is not visualized. No aortic stenosis is present. Aortic valve mean gradient measures 8.0 mmHg. Aortic valve peak gradient measures 13.5 mmHg. Aortic valve area, by VTI measures 2.04 cm. Pulmonic Valve: The pulmonic valve was normal in structure. Pulmonic valve regurgitation is not visualized. No evidence of pulmonic stenosis. Aorta: The aortic root is normal in size and structure. Venous: The inferior vena cava is normal in size with greater than 50% respiratory variability, suggesting right atrial pressure of 3 mmHg. IAS/Shunts: No atrial  level shunt detected by color flow Doppler.  LEFT VENTRICLE PLAX 2D LVIDd:          5.10 cm  Diastology LVIDs:         3.10 cm  LV e' lateral:   6.96 cm/s LV PW:         1.10 cm  LV E/e' lateral: 18.7 LV IVS:        1.20 cm  LV e' medial:    5.55 cm/s LVOT diam:     2.00 cm  LV E/e' medial:  23.4 LV SV:         76 LV SV Index:   37 LVOT Area:     3.14 cm  RIGHT VENTRICLE RV Basal diam:  4.00 cm RV S prime:     6.64 cm/s TAPSE (M-mode): 2.3 cm LEFT ATRIUM             Index       RIGHT ATRIUM           Index LA diam:        4.70 cm 2.26 cm/m  RA Area:     16.70 cm LA Vol (A2C):   60.4 ml 29.05 ml/m RA Volume:   40.10 ml  19.28 ml/m LA Vol (A4C):   56.4 ml 27.12 ml/m LA Biplane Vol: 60.3 ml 29.00 ml/m  AORTIC VALVE AV Area (Vmax):    2.12 cm AV Area (Vmean):   2.01 cm AV Area (VTI):     2.04 cm AV Vmax:           184.00 cm/s AV Vmean:          132.000 cm/s AV VTI:            0.374 m AV Peak Grad:      13.5 mmHg AV Mean Grad:      8.0 mmHg LVOT Vmax:         124.00 cm/s LVOT Vmean:        84.500 cm/s LVOT VTI:          0.243 m LVOT/AV VTI ratio: 0.65  AORTA Ao Root diam: 2.90 cm MITRAL VALVE                TRICUSPID VALVE MV Area (PHT): 4.15 cm     TR Peak grad:   82.1 mmHg MV Decel Time: 183 msec     TR Vmax:        453.00 cm/s MV E velocity: 130.00 cm/s MV A velocity: 36.70 cm/s   SHUNTS MV E/A ratio:  3.54         Systemic VTI:  0.24 m                             Systemic Diam: 2.00 cm Jenkins Rouge MD Electronically signed by Jenkins Rouge MD Signature Date/Time: 12/13/2019/11:36:09 AM    Final    CUP PACEART REMOTE DEVICE CHECK  Result Date: 11/15/2019 Scheduled remote reviewed. Normal device function.  AF burden 4% since 10-18-19. Longest AMS since DCCV on 10-19-19 was 11 minutes. On Eliquis and Tikosyn. Next remote 91 days.    [place]  ECHOCARDIOGRAM IMPRESSIONS    1. Left ventricular ejection fraction, by estimation, is 60 to 65%. The  left ventricle has normal function. The left ventricle has no regional  wall motion abnormalities. There is mild left ventricular hypertrophy.   Left ventricular diastolic parameters  are indeterminate.  2.  Pacing wires in RA/RV. Right ventricular systolic function is normal.  The right ventricular size is normal. There is severely elevated pulmonary  artery systolic pressure.  3. Left atrial size was moderately dilated.  4. The mitral valve is normal in structure. Mild mitral valve  regurgitation. No evidence of mitral stenosis.  5. Tricuspid valve regurgitation is mild to moderate.  6. Normal appearing bioprosthetic AVR with No PvL. The aortic valve has  been repaired/replaced. Aortic valve regurgitation is not visualized. No  aortic stenosis is present.  7. The inferior vena cava is normal in size with greater than 50%  respiratory variability, suggesting right atrial pressure of 3 mmHg.   FINDINGS  Left Ventricle: Left ventricular ejection fraction, by estimation, is 60  to 65%. The left ventricle has normal function. The left ventricle has no  regional wall motion abnormalities. The left ventricular internal cavity  size was normal in size. There is  mild left ventricular hypertrophy. Left ventricular diastolic parameters  are indeterminate.   Right Ventricle: Pacing wires in RA/RV. The right ventricular size is  normal. No increase in right ventricular wall thickness. Right ventricular  systolic function is normal. 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 The tricuspid  regurgitant velocity is 4.53 m/s, and with an assumed right atrial  pressure of 8 mmHg, the estimated right ventricular systolic pressure is  36.6 mmHg.   Left Atrium: Left atrial size was moderately dilated.   Right Atrium: Right atrial size was normal in size.   Pericardium: There is no evidence of pericardial effusion.   Mitral Valve: The mitral valve is normal in structure. There is mild  thickening of the mitral valve leaflet(s). There is mild calcification of  the mitral valve leaflet(s). Normal mobility of  the mitral valve leaflets.  Mild mitral valve regurgitation. No  evidence of mitral valve stenosis.   Tricuspid Valve: The tricuspid valve is normal in structure. Tricuspid  valve regurgitation is mild to moderate. No evidence of tricuspid  stenosis.   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 The aortic  valve has been repaired/replaced. Aortic valve regurgitation is not  visualized. No aortic stenosis is present. Aortic valve mean gradient  measures 8.0 mmHg. Aortic valve peak  gradient measures 13.5 mmHg. Aortic valve area, by VTI measures 2.04 cm.   Pulmonic Valve: The pulmonic valve was normal in structure. Pulmonic valve  regurgitation is not visualized. No evidence of pulmonic stenosis.   Aorta: The aortic root is normal in size and structure.   Venous: The inferior vena cava is normal in size with greater than 50%  respiratory variability, suggesting right atrial pressure of 3 mmHg.   IAS/Shunts: No atrial level shunt detected by color flow Doppler.     LEFT VENTRICLE  PLAX 2D  LVIDd:     5.10 cm Diastology  LVIDs:     3.10 cm LV e' lateral:  6.96 cm/s  LV PW:     1.10 cm LV E/e' lateral: 18.7  LV IVS:    1.20 cm LV e' medial:  5.55 cm/s  LVOT diam:   2.00 cm LV E/e' medial: 23.4  LV SV:     76  LV SV Index:  37  LVOT Area:   3.14 cm     RIGHT VENTRICLE  RV Basal diam: 4.00 cm  RV S prime:   6.64 cm/s  TAPSE (M-mode): 2.3 cm   LEFT ATRIUM       Index  RIGHT ATRIUM      Index  LA diam:    4.70 cm 2.26 cm/m RA Area:   16.70 cm  LA Vol (A2C):  60.4 ml 29.05 ml/m RA Volume:  40.10 ml 19.28 ml/m  LA Vol (A4C):  56.4 ml 27.12 ml/m  LA Biplane Vol: 60.3 ml 29.00 ml/m  AORTIC VALVE  AV Area (Vmax):  2.12 cm  AV Area (Vmean):  2.01 cm  AV Area (VTI):   2.04 cm  AV Vmax:      184.00 cm/s  AV Vmean:     132.000 cm/s  AV VTI:      0.374 m  AV Peak  Grad:   13.5 mmHg  AV Mean Grad:   8.0 mmHg  LVOT Vmax:     124.00 cm/s  LVOT Vmean:    84.500 cm/s  LVOT VTI:     0.243 m  LVOT/AV VTI ratio: 0.65    AORTA  Ao Root diam: 2.90 cm   MITRAL VALVE        TRICUSPID VALVE  MV Area (PHT): 4.15 cm   TR Peak grad:  82.1 mmHg  MV Decel Time: 183 msec   TR Vmax:    453.00 cm/s  MV E velocity: 130.00 cm/s  MV A velocity: 36.70 cm/s  SHUNTS  MV E/A ratio: 3.54     Systemic VTI: 0.24 m               Systemic Diam: 2.00 cm  Subjective: Seen and examined at bedside and he was back to his baseline.  Denied any chest pain, lightheadedness or dizziness.  Still has some mild abdominal pain but improved significantly.  Shortness of breath is stable.  Felt well and wanted to go home.  Discharge Exam: Vitals:   12/14/19 0413 12/14/19 0843  BP: 122/74 123/65  Pulse:  66  Resp: 18   Temp: 98.4 F (36.9 C) 98.1 F (36.7 C)  SpO2:  100%   Vitals:   12/13/19 1940 12/13/19 2300 12/14/19 0413 12/14/19 0843  BP: 132/68 128/85 122/74 123/65  Pulse:    66  Resp: 18 18 18    Temp: 98 F (36.7 C) 98 F (36.7 C) 98.4 F (36.9 C) 98.1 F (36.7 C)  TempSrc: Oral Oral Oral Oral  SpO2:    100%  Weight:   87.2 kg   Height:       General: Pt is alert, awake, not in acute distress Cardiovascular: RRR, S1/S2 +, no rubs, no gallops Respiratory: Diminished bilaterally, no wheezing, no rhonchi Abdominal: Soft, NT, Slightly distended, bowel sounds + Extremities: no appreciable edema, no cyanosis  The results of significant diagnostics from this hospitalization (including imaging, microbiology, ancillary and laboratory) are listed below for reference.    Microbiology: Recent Results (from the past 240 hour(s))  SARS Coronavirus 2 by RT PCR (hospital order, performed in [facility] hospital lab) Nasopharyngeal Nasopharyngeal Swab     Status: None   Collection Time: 12/12/19  2:09 AM   Specimen:  Nasopharyngeal Swab  Result Value Ref Range Status   SARS Coronavirus 2 NEGATIVE NEGATIVE Final    Comment: (NOTE) SARS-CoV-2 target nucleic acids are NOT DETECTED.  The SARS-CoV-2 RNA is generally detectable in upper and lower respiratory specimens during the acute phase of infection. The lowest concentration of SARS-CoV-2 viral copies this assay can detect is 250 copies / mL. A negative result does not preclude SARS-CoV-2 infection and should not be used as the sole basis for  treatment or other patient management decisions.  A negative result may occur with improper specimen collection / handling, submission of specimen other than nasopharyngeal swab, presence of viral mutation(s) within the areas targeted by this assay, and inadequate number of viral copies (<250 copies / mL). A negative result must be combined with clinical observations, patient history, and epidemiological information.  Fact Sheet for Patients:   StrictlyIdeas.no  Fact Sheet for Healthcare Providers: BankingDealers.co.za  This test is not yet approved or  cleared by the [place] FDA and has been authorized for detection and/or diagnosis of SARS-CoV-2 by FDA under an Emergency Use Authorization (EUA).  This EUA will remain in effect (meaning this test can be used) for the duration of the COVID-19 declaration under Section 564(b)(1) of the Act, 21 U.S.C. section 360bbb-3(b)(1), unless the authorization is terminated or revoked sooner.  Performed at [facility], [address] [place], [place] [address]   Urine culture     Status: Abnormal (Preliminary result)   Collection Time: 12/12/19  5:22 AM   Specimen: In/Out Cath Urine  Result Value Ref Range Status   Specimen Description IN/OUT CATH URINE  Final   Special Requests NONE  Final   Culture 10,000 COLONIES/mL ESCHERICHIA COLI (A)  Final   Report Status PENDING  Incomplete   Organism ID,  Bacteria ESCHERICHIA COLI (A)  Final      Susceptibility   Escherichia coli - MIC*    AMPICILLIN <=2 SENSITIVE Sensitive     CEFAZOLIN <=4 SENSITIVE Sensitive     CEFTRIAXONE <=0.25 SENSITIVE Sensitive     CIPROFLOXACIN <=0.25 SENSITIVE Sensitive     GENTAMICIN <=1 SENSITIVE Sensitive     IMIPENEM <=0.25 SENSITIVE Sensitive     NITROFURANTOIN <=16 SENSITIVE Sensitive     TRIMETH/SULFA <=20 SENSITIVE Sensitive     AMPICILLIN/SULBACTAM <=2 SENSITIVE Sensitive     PIP/TAZO Value in next row Sensitive      <=4 SENSITIVEPerformed at [place] [address] [place], [place] [address]    * 10,000 COLONIES/mL ESCHERICHIA COLI  Culture, blood (routine x 2)     Status: None (Preliminary result)   Collection Time: 12/12/19  6:20 AM   Specimen: BLOOD LEFT HAND  Result Value Ref Range Status   Specimen Description   Final    BLOOD LEFT HAND Performed at [facility], [address] [place], [place] [address]    Special Requests   Final    BOTTLES DRAWN AEROBIC AND ANAEROBIC Blood Culture adequate volume Performed at [facility], [address] [place], [place] [address]    Culture   Final    NO GROWTH 2 DAYS Performed at [facility], [place] [address], [place], [place] [address]    Report Status PENDING  Incomplete  Culture, blood (routine x 2)     Status: None (Preliminary result)   Collection Time: 12/12/19  6:30 AM   Specimen: BLOOD RIGHT FOREARM  Result Value Ref Range Status   Specimen Description   Final    BLOOD RIGHT FOREARM Performed at [facility], [address] [place], [place] [address]    Special Requests   Final    BOTTLES DRAWN AEROBIC AND ANAEROBIC Blood Culture adequate volume Performed at [facility], [address] [place], [place] [address]    Culture   Final    NO GROWTH 2 DAYS Performed at [facility], [place] [address] [place], [place] [address]    Report Status PENDING  Incomplete      Labs: BNP (last 3 results) Recent Labs    10/15/19 0305 10/16/19 1312 12/13/19 0120  BNP 228.2* 243.1* 836.6*   Basic Metabolic Panel: Recent Labs  Lab 12/12/19 0133 12/13/19 0327 12/14/19 0339  NA 137 141 136  K 4.7 4.5 4.3  CL 99 105 97*  CO2 27 26 30   GLUCOSE 144* 172* 166*  BUN 13 18 35*  CREATININE 1.15 1.09 1.26*  CALCIUM 9.5 9.6 9.2  MG  --  1.9 2.1  PHOS  --   --  4.3   Liver Function Tests: Recent Labs  Lab 12/12/19 0133 12/14/19 0339  AST 24 16  ALT 25 19  ALKPHOS 45 36*  BILITOT 1.2 0.7  PROT 7.2 6.1*  ALBUMIN 3.9 3.1*   Recent Labs  Lab 12/12/19 0133  LIPASE 21   No results for input(s): AMMONIA in the last 168 hours. CBC: Recent Labs  Lab 12/12/19 0133 12/13/19 0923 12/14/19 0339  WBC 11.8* 12.8* 11.3*  NEUTROABS  --  12.3* 10.5*  HGB 13.3 12.8* 11.9*  HCT 41.0 39.1 36.6*  MCV 94.5 95.6 92.9  PLT 136* 142* 137*   Cardiac Enzymes: No results for input(s): CKTOTAL, CKMB, CKMBINDEX, TROPONINI in the last 168 hours. BNP: Invalid input(s): POCBNP CBG: Recent Labs  Lab 12/13/19 1135 12/13/19 1725 12/13/19 2107 12/14/19 0805 12/14/19 1155  GLUCAP 183* 169* 191* 196* 155*   D-Dimer No results for input(s): DDIMER in the last 72 hours. Hgb A1c No results for input(s): HGBA1C in the last 72 hours. Lipid Profile No results for input(s): CHOL, HDL, LDLCALC, TRIG, CHOLHDL, LDLDIRECT in the last 72 hours. Thyroid function studies No results for input(s): TSH, T4TOTAL, T3FREE, THYROIDAB in the last 72 hours.  Invalid input(s): FREET3 Anemia work up No results for input(s): VITAMINB12, FOLATE, FERRITIN, TIBC, IRON, RETICCTPCT in the last 72 hours. Urinalysis    Component Value Date/Time   COLORURINE YELLOW 12/12/2019 0522   APPEARANCEUR CLEAR 12/12/2019 0522   LABSPEC 1.020 12/12/2019 0522   PHURINE 7.0 12/12/2019 0522   GLUCOSEU 100 (A) 12/12/2019 0522   HGBUR NEGATIVE 12/12/2019 0522   BILIRUBINUR NEGATIVE 12/12/2019 0522    KETONESUR NEGATIVE 12/12/2019 0522   PROTEINUR 100 (A) 12/12/2019 0522   NITRITE NEGATIVE 12/12/2019 0522   LEUKOCYTESUR NEGATIVE 12/12/2019 0522   Sepsis Labs Invalid input(s): PROCALCITONIN,  WBC,  LACTICIDVEN Microbiology Recent Results (from the past 240 hour(s))  SARS Coronavirus 2 by RT PCR (hospital order, performed in [facility] hospital lab) Nasopharyngeal Nasopharyngeal Swab     Status: None   Collection Time: 12/12/19  2:09 AM   Specimen: Nasopharyngeal Swab  Result Value Ref Range Status   SARS Coronavirus 2 NEGATIVE NEGATIVE Final    Comment: (NOTE) SARS-CoV-2 target nucleic acids are NOT DETECTED.  The SARS-CoV-2 RNA is generally detectable in upper and lower respiratory specimens during the acute phase of infection. The lowest concentration of SARS-CoV-2 viral copies this assay can detect is 250 copies / mL. A negative result does not preclude SARS-CoV-2 infection and should not be used as the sole basis for treatment or other patient management decisions.  A negative result may occur with improper specimen collection / handling, submission of specimen other than nasopharyngeal swab, presence of viral mutation(s) within the areas targeted by this assay, and inadequate number of viral copies (<250 copies / mL). A negative result  must be combined with clinical observations, patient history, and epidemiological information.  Fact Sheet for Patients:   StrictlyIdeas.no  Fact Sheet for Healthcare Providers: BankingDealers.co.za  This test is not yet approved or  cleared by the [place] FDA and has been authorized for detection and/or diagnosis of SARS-CoV-2 by FDA under an Emergency Use Authorization (EUA).  This EUA will remain in effect (meaning this test can be used) for the duration of the COVID-19 declaration under Section 564(b)(1) of the Act, 21 U.S.C. section 360bbb-3(b)(1), unless the authorization is  terminated or revoked sooner.  Performed at [facility], [address] [place], [place] [address]   Urine culture     Status: Abnormal (Preliminary result)   Collection Time: 12/12/19  5:22 AM   Specimen: In/Out Cath Urine  Result Value Ref Range Status   Specimen Description IN/OUT CATH URINE  Final   Special Requests NONE  Final   Culture 10,000 COLONIES/mL ESCHERICHIA COLI (A)  Final   Report Status PENDING  Incomplete   Organism ID, Bacteria ESCHERICHIA COLI (A)  Final      Susceptibility   Escherichia coli - MIC*    AMPICILLIN <=2 SENSITIVE Sensitive     CEFAZOLIN <=4 SENSITIVE Sensitive     CEFTRIAXONE <=0.25 SENSITIVE Sensitive     CIPROFLOXACIN <=0.25 SENSITIVE Sensitive     GENTAMICIN <=1 SENSITIVE Sensitive     IMIPENEM <=0.25 SENSITIVE Sensitive     NITROFURANTOIN <=16 SENSITIVE Sensitive     TRIMETH/SULFA <=20 SENSITIVE Sensitive     AMPICILLIN/SULBACTAM <=2 SENSITIVE Sensitive     PIP/TAZO Value in next row Sensitive      <=4 SENSITIVEPerformed at [place] [address] [place], [place] [address]    * 10,000 COLONIES/mL ESCHERICHIA COLI  Culture, blood (routine x 2)     Status: None (Preliminary result)   Collection Time: 12/12/19  6:20 AM   Specimen: BLOOD LEFT HAND  Result Value Ref Range Status   Specimen Description   Final    BLOOD LEFT HAND Performed at [facility], [address] [place], [place] [address]    Special Requests   Final    BOTTLES DRAWN AEROBIC AND ANAEROBIC Blood Culture adequate volume Performed at [facility], [address] [place], [place] [address]    Culture   Final    NO GROWTH 2 DAYS Performed at [facility], [place] [address], [place], [place] [address]    Report Status PENDING  Incomplete  Culture, blood (routine x 2)     Status: None (Preliminary result)   Collection Time: 12/12/19  6:30 AM   Specimen: BLOOD RIGHT FOREARM  Result Value Ref Range Status   Specimen  Description   Final    BLOOD RIGHT FOREARM Performed at [facility], [address] [place], [place] [address]    Special Requests   Final    BOTTLES DRAWN AEROBIC AND ANAEROBIC Blood Culture adequate volume Performed at [facility], [address] [place], [place] [address]    Culture   Final    NO GROWTH 2 DAYS Performed at [facility], [place] [address] [place],  [address]    Report Status PENDING  Incomplete   Time coordinating discharge: 35 minutes  SIGNED:  Kerney Elbe, DO [facility] 12/14/2019, 7:02 PM Pager is on [place]  If 7PM-7AM, please contact night-coverage [email or web address]

## 2019-12-14 NOTE — Telephone Encounter (Signed)
CSW received call from pt wife inquiring about bringing in additional required paperwork to complete BMS application as well as to request samples as pt is currently in the donut whole and Eliquis would cost over $300.  CSW placed samples at front desk for her to drop off and she will plan to bring by additional paperwork for BMS application when she picks up [facility] Desk#: [phone] Cell#: [phone]

## 2019-12-14 NOTE — Progress Notes (Signed)
Discharge instructions provided to patient and patient's wife at bedside. IV removed and all belongings returned to patient. No further question ask. Patient discharged

## 2019-12-15 NOTE — Telephone Encounter (Signed)
Patient is required to spend $768 before he can be approved to receive BMS assistance.  Sent in [place] today.  Will follow up.

## 2019-12-17 LAB — CULTURE, BLOOD (ROUTINE X 2)
Culture: NO GROWTH
Culture: NO GROWTH
Special Requests: ADEQUATE
Special Requests: ADEQUATE

## 2019-12-17 LAB — URINE CULTURE: Culture: 10000 — AB

## 2019-12-22 ENCOUNTER — Other Ambulatory Visit: Payer: Self-pay | Admitting: Internal Medicine

## 2019-12-22 ENCOUNTER — Ambulatory Visit (HOSPITAL_COMMUNITY)
Admission: RE | Admit: 2019-12-22 | Discharge: 2019-12-22 | Disposition: A | Discharge status: Home / Self Care | Payer: Medicare Other | Source: Ambulatory Visit | Attending: Internal Medicine | Admitting: Internal Medicine

## 2019-12-22 ENCOUNTER — Other Ambulatory Visit: Payer: Self-pay

## 2019-12-22 DIAGNOSIS — I6529 Occlusion and stenosis of unspecified carotid artery: Secondary | ICD-10-CM

## 2019-12-22 NOTE — Telephone Encounter (Signed)
Advanced Heart Failure Patient Advocate Encounter   Patient was approved to receive Eliquis from BMS.  Patient ID: [id number] Effective dates: 12/21/19 through 04/28/20.  Spoke with patient's wife and provided phone number to BMS.  Charlann Boxer, CPhT

## 2019-12-23 ENCOUNTER — Telehealth: Payer: Self-pay | Admitting: Primary Care

## 2019-12-23 MED ORDER — BREZTRI AEROSPHERE 160-9-4.8 MCG/ACT IN AERO: 2.0000 | INHALATION_SPRAY | Freq: Two times a day (BID) | RESPIRATORY_TRACT | 0 refills | Status: DC

## 2019-12-23 NOTE — Telephone Encounter (Signed)
Called and spoke with wife [name] and let her know that the name of inhaler was L[employer], academic. She stated that she dropped off the application for patient assistance this morning. Told her that I would locate it and go ahead a fax it over to [employer] and make a copy. Application has been faxed, copy has been placed in scan folder to go in patient's chart. I also placed 2 samples of inhaler up front for patient she is aware. Nothing further needed at this time.

## 2019-12-31 ENCOUNTER — Telehealth: Payer: Self-pay | Admitting: Emergency Medicine

## 2019-12-31 NOTE — Telephone Encounter (Signed)
Spoke with wife, advised that per [facility] documentation on 8/26 phone note pt's application to [employer] was faxed on 8/26.  I advised that typically it takes a couple weeks for companies to process these forms, and that they would reach out to pt directly to set up shipment.  Wife expressed understanding.  Nothing further needed at this time- will close encounter.

## 2020-01-06 ENCOUNTER — Ambulatory Visit: Payer: Medicare Other | Admitting: Emergency Medicine

## 2020-01-10 ENCOUNTER — Ambulatory Visit: Payer: Medicare Other | Admitting: Student

## 2020-01-10 ENCOUNTER — Encounter: Payer: Self-pay | Admitting: Student

## 2020-01-10 ENCOUNTER — Other Ambulatory Visit: Payer: Self-pay

## 2020-01-10 VITALS — BP 124/60 | HR 67 | Ht 71.0 in | Wt 192.0 lb

## 2020-01-10 DIAGNOSIS — I442 Atrioventricular block, complete: Secondary | ICD-10-CM | POA: Diagnosis not present

## 2020-01-10 DIAGNOSIS — I4819 Other persistent atrial fibrillation: Secondary | ICD-10-CM

## 2020-01-10 DIAGNOSIS — I5032 Chronic diastolic (congestive) heart failure: Secondary | ICD-10-CM | POA: Diagnosis not present

## 2020-01-10 DIAGNOSIS — Z950 Presence of cardiac pacemaker: Secondary | ICD-10-CM

## 2020-01-10 DIAGNOSIS — I272 Pulmonary hypertension, unspecified: Secondary | ICD-10-CM

## 2020-01-10 LAB — CUP PACEART INCLINIC DEVICE CHECK
Battery Remaining Longevity: 7 mo
Battery Voltage: 2.68 V
Brady Statistic RA Percent Paced: 57 %
Brady Statistic RV Percent Paced: 99.78 %
Date Time Interrogation Session: 20210913120655
Implantable Lead Implant Date: 20131203
Implantable Lead Implant Date: 20131203
Implantable Lead Location: 753859
Implantable Lead Location: 753860
Implantable Lead Model: 1948
Implantable Pulse Generator Implant Date: 20131203
Lead Channel Impedance Value: 425 Ohm
Lead Channel Impedance Value: 650 Ohm
Lead Channel Pacing Threshold Amplitude: 0.75 V
Lead Channel Pacing Threshold Amplitude: 0.875 V
Lead Channel Pacing Threshold Pulse Width: 0.5 ms
Lead Channel Pacing Threshold Pulse Width: 0.8 ms
Lead Channel Sensing Intrinsic Amplitude: 10 mV
Lead Channel Sensing Intrinsic Amplitude: 3.8 mV
Lead Channel Setting Pacing Amplitude: 1.125
Lead Channel Setting Pacing Amplitude: 1.75 V
Lead Channel Setting Pacing Pulse Width: 0.8 ms
Lead Channel Setting Sensing Sensitivity: 4 mV
Pulse Gen Model: 2210
Pulse Gen Serial Number: 7408461

## 2020-01-10 LAB — CBC WITH DIFFERENTIAL/PLATELET
Basophils Absolute: 0 10*3/uL (ref 0.0–0.2)
Basos: 1 %
EOS (ABSOLUTE): 0.2 10*3/uL (ref 0.0–0.4)
Eos: 2 %
Hematocrit: 40.8 % (ref 37.5–51.0)
Hemoglobin: 13.4 g/dL (ref 13.0–17.7)
Immature Grans (Abs): 0.1 10*3/uL (ref 0.0–0.1)
Immature Granulocytes: 1 %
Lymphocytes Absolute: 1.1 10*3/uL (ref 0.7–3.1)
Lymphs: 13 %
MCH: 30.7 pg (ref 26.6–33.0)
MCHC: 32.8 g/dL (ref 31.5–35.7)
MCV: 94 fL (ref 79–97)
Monocytes Absolute: 0.6 10*3/uL (ref 0.1–0.9)
Monocytes: 7 %
Neutrophils Absolute: 6.5 10*3/uL (ref 1.4–7.0)
Neutrophils: 76 %
Platelets: 170 10*3/uL (ref 150–450)
RBC: 4.36 x10E6/uL (ref 4.14–5.80)
RDW: 13.8 % (ref 11.6–15.4)
WBC: 8.6 10*3/uL (ref 3.4–10.8)

## 2020-01-10 LAB — BASIC METABOLIC PANEL
BUN/Creatinine Ratio: 14 (ref 10–24)
BUN: 19 mg/dL (ref 8–27)
CO2: 25 mmol/L (ref 20–29)
Calcium: 9.6 mg/dL (ref 8.6–10.2)
Chloride: 94 mmol/L — ABNORMAL LOW (ref 96–106)
Creatinine, Ser: 1.36 mg/dL — ABNORMAL HIGH (ref 0.76–1.27)
GFR calc Af Amer: 61 mL/min/{1.73_m2} (ref >59)
GFR calc non Af Amer: 52 mL/min/{1.73_m2} — ABNORMAL LOW (ref >59)
Glucose: 294 mg/dL — ABNORMAL HIGH (ref 65–99)
Potassium: 3.9 mmol/L (ref 3.5–5.2)
Sodium: 133 mmol/L — ABNORMAL LOW (ref 134–144)

## 2020-01-10 NOTE — Progress Notes (Signed)
Electrophysiology Office Note Date: 01/10/2020  ID:  [name], DOB [birth year], MRN [id number]  PCP: Velna Hatchet, MD Primary Cardiologist: No primary care provider on file. Electrophysiologist: Thompson Grayer, MD   CC: Pacemaker follow-up  [name] is a 69 y.o. male seen today for Thompson Grayer, MD for routine electrophysiology followup.  Since last being seen in our clinic the patient reports doing very well. He has .  he denies chest pain, palpitations, dyspnea, PND, orthopnea, nausea, vomiting, dizziness, syncope, edema, weight gain, or early satiety.  Device History: St. Jude Dual Chamber PPM implanted 03/31/2012 for second degree AV block  Past Medical History:  Diagnosis Date  . Aortic stenosis, mild   . Arthritis of hand    "just a little bit in both hands" (03/31/2012)  . Asthma    "little bit" (03/31/2012)  . Atrial fibrillation [facility])    dx '04; DCCV '04, placed on flecainide, failed DCCV 04/2010, flecainide stopped; s/p successful A.fib ablation 01/31/12  . CHF (congestive heart failure) ([place])   . Controlled type 2 diabetes mellitus without complication, without long-term current use of insulin ([place]) 12/13/2019  . COPD (chronic obstructive pulmonary disease) ([place])   . Diabetes mellitus without complication (HCC)    borderline  . DJD (degenerative joint disease)   . Dyspnea   . Exertional dyspnea   . Fibromyalgia   . First degree AV block    post ablation, ~46ms  . GERD (gastroesophageal reflux disease)   . Heart murmur   . Hyperlipidemia   . Hypertension   . Hypothyroidism    S/P radiation  . Left atrial enlargement    LA size 55mm by echo 11/21/11  . Mitral regurgitation    trivial  . Nausea 12/13/2019  . Obesity   . Obstructive sleep apnea    mild by sleep study 2013; pt stated he does not have a machine because "it wasn't bad enough for him to have one"  . Pacemaker 03/31/2012  . Restless leg syndrome   . Second degree AV block   .  Splenomegaly   . Visit for monitoring Tikosyn therapy 04/16/2016   Past Surgical History:  Procedure Laterality Date  . AORTIC VALVE REPLACEMENT N/A 08/22/2015   Procedure: AORTIC VALVE REPLACEMENT (AVR);  Surgeon: Ivin Poot, MD;  Location: [place];  Service: Open Heart Surgery;  Laterality: N/A;  . ATRIAL FIBRILLATION ABLATION  01/30/2012   PVI by Dr. Rayann Heman  . ATRIAL FIBRILLATION ABLATION N/A 01/31/2012   Procedure: ATRIAL FIBRILLATION ABLATION;  Surgeon: Thompson Grayer, MD;  Location: [facility] CATH LAB;  Service: Cardiovascular;  Laterality: N/A;  . BACK SURGERY     X 3  . CARDIAC CATHETERIZATION N/A 08/09/2015   Procedure: Right/Left Heart Cath and Coronary Angiography;  Surgeon: Peter M Martinique, MD;  Location: [place] CV LAB;  Service: Cardiovascular;  Laterality: N/A;  . CARDIAC CATHETERIZATION N/A 02/05/2016   Procedure: Right/Left Heart Cath and Coronary Angiography;  Surgeon: Jettie Booze, MD;  Location: [place] CV LAB;  Service: Cardiovascular;  Laterality: N/A;  . CARDIAC CATHETERIZATION N/A 04/17/2016   Procedure: Right Heart Cath;  Surgeon: Jolaine Artist, MD;  Location: [place] CV LAB;  Service: Cardiovascular;  Laterality: N/A;  . CARDIOVASCULAR STRESS TEST  03/12/2002   EF 48%, NO EVIDENCE OF ISCHEMIA  . CARDIOVERSION  01/2012; 03/31/2012  . CARDIOVERSION N/A 02/25/2012   Procedure: CARDIOVERSION;  Surgeon: Thompson Grayer, MD;  Location: [facility] CATH LAB;  Service: Cardiovascular;  Laterality: N/A;  . CARDIOVERSION N/A 03/31/2012   Procedure: CARDIOVERSION;  Surgeon: Thompson Grayer, MD;  Location: [facility] CATH LAB;  Service: Cardiovascular;  Laterality: N/A;  . CARDIOVERSION N/A 11/23/2015   Procedure: CARDIOVERSION;  Surgeon: Larey Dresser, MD;  Location: [place];  Service: Cardiovascular;  Laterality: N/A;  . CARDIOVERSION N/A 04/08/2016   Procedure: CARDIOVERSION;  Surgeon: Jolaine Artist, MD;  Location: [facility] ENDOSCOPY;  Service: Cardiovascular;  Laterality: N/A;    . CARDIOVERSION N/A 09/14/2018   Procedure: CARDIOVERSION;  Surgeon: Josue Hector, MD;  Location: [facility] ENDOSCOPY;  Service: Cardiovascular;  Laterality: N/A;  . CARDIOVERSION N/A 10/19/2019   Procedure: CARDIOVERSION;  Surgeon: Jolaine Artist, MD;  Location: [place];  Service: Cardiovascular;  Laterality: N/A;  . CLIPPING OF ATRIAL APPENDAGE N/A 08/22/2015   Procedure: CLIPPING OF ATRIAL APPENDAGE;  Surgeon: Ivin Poot, MD;  Location: [place];  Service: Open Heart Surgery;  Laterality: N/A;  . DOPPLER ECHOCARDIOGRAPHY  03/11/2002   EF 70-75%  . FINGER SURGERY Left    Middle finger  . FINGER TENDON REPAIR  1980's   "right little finger" (03/31/2012)  . INSERT / REPLACE / REMOVE PACEMAKER     St Jude  . IR RADIOLOGIST EVAL & MGMT  05/06/2019  . IR RADIOLOGIST EVAL & MGMT  10/06/2019  . LEFT AND RIGHT HEART CATHETERIZATION WITH CORONARY ANGIOGRAM N/A 10/27/2012   Procedure: LEFT AND RIGHT HEART CATHETERIZATION WITH CORONARY ANGIOGRAM;  Surgeon: Laverda Page, MD;  Location: [facility] CATH LAB;  Service: Cardiovascular;  Laterality: N/A;  . LUMBAR DISC SURGERY  1980's X2;  2000's  . MAZE N/A 08/22/2015   Procedure: MAZE;  Surgeon: Ivin Poot, MD;  Location: [place];  Service: Open Heart Surgery;  Laterality: N/A;  . PACEMAKER INSERTION  03/31/2012   STJ Accent DR pacemaker implanted by Dr Rayann Heman  . PERMANENT PACEMAKER INSERTION N/A 03/31/2012   Procedure: PERMANENT PACEMAKER INSERTION;  Surgeon: Thompson Grayer, MD;  Location: [facility] CATH LAB;  Service: Cardiovascular;  Laterality: N/A;  . TEE WITHOUT CARDIOVERSION  01/30/2012   Procedure: TRANSESOPHAGEAL ECHOCARDIOGRAM (TEE);  Surgeon: Larey Dresser, MD;  Location: [place];  Service: Cardiovascular;  Laterality: N/A;  ablation next day  . TEE WITHOUT CARDIOVERSION N/A 08/22/2015   Procedure: TRANSESOPHAGEAL ECHOCARDIOGRAM (TEE);  Surgeon: Ivin Poot, MD;  Location: [place];  Service: Open Heart Surgery;  Laterality: N/A;  . TEE WITHOUT  CARDIOVERSION N/A 10/19/2019   Procedure: TRANSESOPHAGEAL ECHOCARDIOGRAM (TEE);  Surgeon: Jolaine Artist, MD;  Location: [facility] ENDOSCOPY;  Service: Cardiovascular;  Laterality: N/A;  . US ECHOCARDIOGRAPHY  08/07/2009   EF 55-60%    Current Outpatient Medications  Medication Sig Dispense Refill  . albuterol (PROVENTIL HFA;VENTOLIN HFA) 108 (90 Base) MCG/ACT inhaler Inhale 2 puffs into the lungs every 6 (six) hours as needed for wheezing or shortness of breath. 1 Inhaler 5  . amLODipine (NORVASC) 10 MG tablet Take 0.5 tablets (5 mg total) by mouth daily. 30 tablet 11  . apixaban (ELIQUIS) 5 MG TABS tablet TAKE 1 TABLET(5 MG) BY MOUTH TWICE DAILY 180 tablet 3  . bisoprolol (ZEBETA) 5 MG tablet Take 1 tablet (5 mg total) by mouth daily. 30 tablet 2  . Budeson-Glycopyrrol-Formoterol (BREZTRI AEROSPHERE) 160-9-4.8 MCG/ACT AERO Inhale 2 puffs into the lungs 2 (two) times daily. 4 g 0  . Cholecalciferol (VITAMIN D) 2000 UNITS CAPS Take 8,000 Units by mouth at bedtime.     . dofetilide (TIKOSYN) 250 MCG capsule Take  1 capsule (250 mcg total) by mouth 2 (two) times daily. 180 capsule 1  . Ensure Max Protein (ENSURE MAX PROTEIN) LIQD Take 330 mLs (11 oz total) by mouth 2 (two) times daily. 3300 mL 0  . fexofenadine (ALLEGRA) 180 MG tablet Take 1 tablet (180 mg total) by mouth daily. (Patient taking differently: Take 180 mg by mouth daily as needed for allergies or rhinitis. ) 30 tablet 1  . gabapentin (NEURONTIN) 300 MG capsule Take 600-1,200 mg by mouth See admin instructions. Take 2 capsules ( 600 mg) by mouth in the morning and  at lunchtime, and take 4 capsules (1,200 mg) at bedtime  3  . HYDROcodone-acetaminophen (NORCO/VICODIN) 5-325 MG tablet Take 1 tablet by mouth 2 (two) times daily as needed (pain).     [name] levothyroxine (SYNTHROID, LEVOTHROID) 175 MCG tablet Take 175 mcg by mouth daily before breakfast.    . MAGNESIUM PO Take 400 mcg by mouth at bedtime. Has zinc/calcium    . Multiple Vitamin  (MULTIVITAMIN WITH MINERALS) TABS tablet Take 1 tablet by mouth daily. 30 tablet 0  . ondansetron (ZOFRAN ODT) 4 MG disintegrating tablet Take 1 tablet (4 mg total) by mouth every 8 (eight) hours as needed. 20 tablet 0  . OXYGEN Inhale 2 L into the lungs continuous.    . pantoprazole (PROTONIX) 40 MG tablet Take 40 mg by mouth daily.    . polyethylene glycol powder (GLYCOLAX/MIRALAX) powder Take 17 g by mouth daily. (Patient taking differently: Take 17 g by mouth daily as needed for mild constipation. ) 500 g 0  . potassium chloride SA (KLOR-CON) 20 MEQ tablet Take 1 tablet (20 mEq total) by mouth 2 (two) times daily. Needs appt for further refills 180 tablet 0  . predniSONE (DELTASONE) 5 MG tablet Take 1 tablet (5 mg total) by mouth daily with breakfast. 30 tablet 3  . rOPINIRole (REQUIP) 1 MG tablet Take 1 tablet (1 mg total) by mouth daily. (Patient taking differently: Take 1 mg by mouth in the morning and at bedtime. ) 90 tablet 2  . rosuvastatin (CRESTOR) 10 MG tablet Take 10 mg by mouth at bedtime.     [name] spironolactone (ALDACTONE) 25 MG tablet Take 1 tablet (25 mg total) by mouth daily. 90 tablet 3  . torsemide (DEMADEX) 20 MG tablet Take 20 mg by mouth every other day.    . traMADol-acetaminophen (ULTRACET) 37.5-325 MG tablet Take 2 tablets by mouth 2 (two) times daily.    . traZODone (DESYREL) 50 MG tablet Take 12.5 mg by mouth at bedtime.      No current facility-administered medications for this visit.    Allergies:   Meloxicam and Vancomycin   Social History: Social History   Socioeconomic History  . Marital status: Married    Spouse name: Not on file  . Number of children: Not on file  . Years of education: Not on file  . Highest education level: Not on file  Occupational History  . Not on file  Tobacco Use  . Smoking status: Former Smoker    Packs/day: 0.50    Years: 15.00    Pack years: 7.50    Types: Cigarettes    Quit date: 07/18/1991    Years since quitting: 28.5    . Smokeless tobacco: Never Used  Vaping Use  . Vaping Use: Never used  Substance and Sexual Activity  . Alcohol use: Yes    Comment: occasional  . Drug use: Yes    Types:  Marijuana    Comment: 02/2019  . Sexual activity: Not Currently  Other Topics Concern  . Not on file  Social History Narrative   Lives in [place] with spouse.  Works as a Air traffic controller.   Social Determinants of Health   Financial Resource Strain:   . Difficulty of Paying Living Expenses: Not on file  Food Insecurity:   . Worried About Charity fundraiser in the Last Year: Not on file  . Ran Out of Food in the Last Year: Not on file  Transportation Needs:   . Lack of Transportation (Medical): Not on file  . Lack of Transportation (Non-Medical): Not on file  Physical Activity:   . Days of Exercise per Week: Not on file  . Minutes of Exercise per Session: Not on file  Stress:   . Feeling of Stress : Not on file  Social Connections:   . Frequency of Communication with Friends and Family: Not on file  . Frequency of Social Gatherings with Friends and Family: Not on file  . Attends Religious Services: Not on file  . Active Member of Clubs or Organizations: Not on file  . Attends Archivist Meetings: Not on file  . Marital Status: Not on file  Intimate Partner Violence:   . Fear of Current or Ex-Partner: Not on file  . Emotionally Abused: Not on file  . Physically Abused: Not on file  . Sexually Abused: Not on file    Family History: Family History  Problem Relation Age of Onset  . Heart failure Mother   . Stroke Father      Review of Systems: All other systems reviewed and are otherwise negative except as noted above.  Physical Exam: Vitals:   01/10/20 1131  BP: 124/60  Pulse: 67  SpO2: 100%  Weight: 192 lb (87.1 kg)  Height: 5\' 11"  (1.803 m)     GEN- The patient is well appearing, alert and oriented x 3 today.   HEENT: normocephalic, atraumatic; sclera clear,  conjunctiva pink; hearing intact; oropharynx clear; neck supple  Lungs- Clear to ausculation bilaterally, normal work of breathing.  No wheezes, rales, rhonchi Heart- Regular rate and rhythm, no murmurs, rubs or gallops  GI- soft, non-tender, non-distended, bowel sounds present  Extremities- no clubbing or cyanosis. No edema MS- no significant deformity or atrophy Skin- warm and dry, no rash or lesion; PPM pocket well healed Psych- euthymic mood, full affect Neuro- strength and sensation are intact  PPM Interrogation- reviewed in detail today,  See PACEART report  EKG:  EKG is not ordered today.  Recent Labs: 12/13/2019: B Natriuretic Peptide 611.6 12/14/2019: ALT 19; BUN 35; Creatinine, Ser 1.26; Hemoglobin 11.9; Magnesium 2.1; Platelets 137; Potassium 4.3; Sodium 136   Wt Readings from Last 3 Encounters:  01/10/20 192 lb (87.1 kg)  12/14/19 192 lb 3.9 oz (87.2 kg)  11/30/19 180 lb (81.6 kg)     Other studies Reviewed: Additional studies/ records that were reviewed today include: Previous EP office notes, Previous remote checks, Most recent labwork.   Assessment and Plan:  1. Symptomatic bradycardia in setting of second degree AV block s/p St. Jude PPM  Normal PPM function See Pace Art report No changes today  2. Paroxysmal AF 2% by device interrogation. Longest episode 23 minutes long.  S/p MAZE and LAA amputation Continue eliquis for CHA2DS2VASC of at least 5.   Continue Tikosyn. QTc stable by recent EKG (12/13/2019, chronic medication)  3. Chronic SOB Stable in  setting of chronic lung disease  4. HTN Continue current medications.   Current medicines are reviewed at length with the patient today.   The patient does not have concerns regarding his medicines.  The following changes were made today:  none  Labs/ tests ordered today include:  Orders Placed This Encounter  Procedures  . Basic Metabolic Panel (BMET)  . CBC w/Diff     Disposition:   Follow up with  Dr. Rayann Heman in 6 Months for follow up tikosyn and to discuss gen change.     Jacalyn Lefevre, PA-C  01/10/2020 12:06 PM  [address] [place] [id number] [phone] (office) [phone] (fax)

## 2020-01-10 NOTE — Patient Instructions (Addendum)
Medication Instructions:  *If you need a refill on your cardiac medications before your next appointment, please call your pharmacy*  Lab Work: Your physician has recommended that you have lab work today: BMET and CBC  If you have labs (blood work) drawn today and your tests are completely normal, you will receive your results only by: [name] MyChart Message (if you have MyChart) OR . A paper copy in the mail If you have any lab test that is abnormal or we need to change your treatment, we will call you to review the results.  Follow-Up: At [facility], you and your health needs are our priority.  As part of our continuing mission to provide you with exceptional heart care, we have created designated Provider Care Teams.  These Care Teams include your primary Cardiologist (physician) and Advanced Practice Providers (APPs -  Physician Assistants and Nurse Practitioners) who all work together to provide you with the care you need, when you need it.  We recommend signing up for the patient portal called "MyChart".  Sign up information is provided on this After Visit Summary.  MyChart is used to connect with patients for Virtual Visits (Telemedicine).  Patients are able to view lab/test results, encounter notes, upcoming appointments, etc.  Non-urgent messages can be sent to your provider as well.   To learn more about what you can do with MyChart, go to [email or web address].    Your next appointment:   Your physician recommends that you schedule a follow-up appointment in 6 MONTHS with Dr. Rayann Heman -- Wednesday 07/12/20 at 2:15 pm  Remote monitoring is used to monitor your Pacemaker from home. This monitoring reduces the number of office visits required to check your device to one time per year. It allows [place] to keep an eye on the functioning of your device to ensure it is working properly. You are scheduled for a device check from home on 02/14/20. You may send your transmission at any time that day.  If you have a wireless device, the transmission will be sent automatically. After your physician reviews your transmission, you will receive a postcard with your next transmission date.  The format for your next appointment:   In Person with Thompson Grayer, MD

## 2020-01-17 ENCOUNTER — Other Ambulatory Visit (HOSPITAL_COMMUNITY): Payer: Self-pay | Admitting: Internal Medicine

## 2020-01-21 ENCOUNTER — Encounter (HOSPITAL_COMMUNITY): Payer: Self-pay | Admitting: *Deleted

## 2020-01-21 ENCOUNTER — Other Ambulatory Visit: Payer: Self-pay

## 2020-01-21 ENCOUNTER — Other Ambulatory Visit (HOSPITAL_COMMUNITY): Payer: Medicare Other

## 2020-01-21 ENCOUNTER — Inpatient Hospital Stay (HOSPITAL_COMMUNITY)
Admission: EM | Admit: 2020-01-21 | Discharge: 2020-01-23 | DRG: 291 | Disposition: A | Discharge status: Home / Self Care | Payer: Medicare Other | Attending: Internal Medicine | Admitting: Internal Medicine

## 2020-01-21 ENCOUNTER — Ambulatory Visit (HOSPITAL_BASED_OUTPATIENT_CLINIC_OR_DEPARTMENT_OTHER)
Admission: RE | Admit: 2020-01-21 | Discharge: 2020-01-21 | Disposition: A | Discharge status: Home / Self Care | Payer: Medicare Other | Source: Ambulatory Visit | Attending: Internal Medicine | Admitting: Internal Medicine

## 2020-01-21 ENCOUNTER — Encounter (HOSPITAL_COMMUNITY): Payer: Self-pay | Admitting: Internal Medicine

## 2020-01-21 ENCOUNTER — Ambulatory Visit (HOSPITAL_COMMUNITY)
Admission: RE | Admit: 2020-01-21 | Discharge: 2020-01-21 | Disposition: A | Discharge status: Home / Self Care | Payer: Medicare Other | Source: Ambulatory Visit | Attending: Internal Medicine | Admitting: Internal Medicine

## 2020-01-21 VITALS — BP 150/82 | HR 70 | Ht 71.0 in | Wt 198.4 lb

## 2020-01-21 DIAGNOSIS — I5043 Acute on chronic combined systolic (congestive) and diastolic (congestive) heart failure: Secondary | ICD-10-CM | POA: Diagnosis present

## 2020-01-21 DIAGNOSIS — M79605 Pain in left leg: Secondary | ICD-10-CM | POA: Diagnosis present

## 2020-01-21 DIAGNOSIS — I272 Pulmonary hypertension, unspecified: Secondary | ICD-10-CM | POA: Diagnosis not present

## 2020-01-21 DIAGNOSIS — M797 Fibromyalgia: Secondary | ICD-10-CM | POA: Diagnosis present

## 2020-01-21 DIAGNOSIS — G4733 Obstructive sleep apnea (adult) (pediatric): Secondary | ICD-10-CM | POA: Diagnosis present

## 2020-01-21 DIAGNOSIS — G8929 Other chronic pain: Secondary | ICD-10-CM | POA: Diagnosis present

## 2020-01-21 DIAGNOSIS — D696 Thrombocytopenia, unspecified: Secondary | ICD-10-CM | POA: Diagnosis present

## 2020-01-21 DIAGNOSIS — M19041 Primary osteoarthritis, right hand: Secondary | ICD-10-CM | POA: Diagnosis present

## 2020-01-21 DIAGNOSIS — R109 Unspecified abdominal pain: Secondary | ICD-10-CM | POA: Diagnosis present

## 2020-01-21 DIAGNOSIS — Z8249 Family history of ischemic heart disease and other diseases of the circulatory system: Secondary | ICD-10-CM

## 2020-01-21 DIAGNOSIS — N1832 Chronic kidney disease, stage 3b: Secondary | ICD-10-CM | POA: Diagnosis present

## 2020-01-21 DIAGNOSIS — G4730 Sleep apnea, unspecified: Secondary | ICD-10-CM | POA: Diagnosis present

## 2020-01-21 DIAGNOSIS — Z923 Personal history of irradiation: Secondary | ICD-10-CM

## 2020-01-21 DIAGNOSIS — R6883 Chills (without fever): Secondary | ICD-10-CM | POA: Diagnosis not present

## 2020-01-21 DIAGNOSIS — K219 Gastro-esophageal reflux disease without esophagitis: Secondary | ICD-10-CM | POA: Diagnosis present

## 2020-01-21 DIAGNOSIS — Z7901 Long term (current) use of anticoagulants: Secondary | ICD-10-CM

## 2020-01-21 DIAGNOSIS — G2581 Restless legs syndrome: Secondary | ICD-10-CM | POA: Diagnosis present

## 2020-01-21 DIAGNOSIS — Z952 Presence of prosthetic heart valve: Secondary | ICD-10-CM

## 2020-01-21 DIAGNOSIS — J449 Chronic obstructive pulmonary disease, unspecified: Secondary | ICD-10-CM | POA: Diagnosis present

## 2020-01-21 DIAGNOSIS — I5033 Acute on chronic diastolic (congestive) heart failure: Secondary | ICD-10-CM | POA: Diagnosis not present

## 2020-01-21 DIAGNOSIS — Z950 Presence of cardiac pacemaker: Secondary | ICD-10-CM

## 2020-01-21 DIAGNOSIS — E1165 Type 2 diabetes mellitus with hyperglycemia: Secondary | ICD-10-CM | POA: Diagnosis present

## 2020-01-21 DIAGNOSIS — Z79890 Hormone replacement therapy (postmenopausal): Secondary | ICD-10-CM

## 2020-01-21 DIAGNOSIS — J9611 Chronic respiratory failure with hypoxia: Secondary | ICD-10-CM | POA: Diagnosis present

## 2020-01-21 DIAGNOSIS — E785 Hyperlipidemia, unspecified: Secondary | ICD-10-CM | POA: Diagnosis present

## 2020-01-21 DIAGNOSIS — Z87891 Personal history of nicotine dependence: Secondary | ICD-10-CM

## 2020-01-21 DIAGNOSIS — R0602 Shortness of breath: Secondary | ICD-10-CM

## 2020-01-21 DIAGNOSIS — N183 Chronic kidney disease, stage 3 unspecified: Secondary | ICD-10-CM | POA: Diagnosis present

## 2020-01-21 DIAGNOSIS — Z20822 Contact with and (suspected) exposure to covid-19: Secondary | ICD-10-CM | POA: Diagnosis present

## 2020-01-21 DIAGNOSIS — E1122 Type 2 diabetes mellitus with diabetic chronic kidney disease: Secondary | ICD-10-CM | POA: Diagnosis present

## 2020-01-21 DIAGNOSIS — I130 Hypertensive heart and chronic kidney disease with heart failure and stage 1 through stage 4 chronic kidney disease, or unspecified chronic kidney disease: Principal | ICD-10-CM | POA: Diagnosis present

## 2020-01-21 DIAGNOSIS — Z79899 Other long term (current) drug therapy: Secondary | ICD-10-CM

## 2020-01-21 DIAGNOSIS — J4541 Moderate persistent asthma with (acute) exacerbation: Secondary | ICD-10-CM | POA: Diagnosis present

## 2020-01-21 DIAGNOSIS — I2510 Atherosclerotic heart disease of native coronary artery without angina pectoris: Secondary | ICD-10-CM | POA: Diagnosis present

## 2020-01-21 DIAGNOSIS — J441 Chronic obstructive pulmonary disease with (acute) exacerbation: Secondary | ICD-10-CM | POA: Diagnosis present

## 2020-01-21 DIAGNOSIS — I480 Paroxysmal atrial fibrillation: Secondary | ICD-10-CM | POA: Diagnosis present

## 2020-01-21 DIAGNOSIS — Z7952 Long term (current) use of systemic steroids: Secondary | ICD-10-CM

## 2020-01-21 DIAGNOSIS — Z881 Allergy status to other antibiotic agents status: Secondary | ICD-10-CM

## 2020-01-21 DIAGNOSIS — I10 Essential (primary) hypertension: Secondary | ICD-10-CM | POA: Diagnosis present

## 2020-01-21 DIAGNOSIS — E119 Type 2 diabetes mellitus without complications: Secondary | ICD-10-CM | POA: Diagnosis present

## 2020-01-21 DIAGNOSIS — Z888 Allergy status to other drugs, medicaments and biological substances status: Secondary | ICD-10-CM

## 2020-01-21 DIAGNOSIS — E039 Hypothyroidism, unspecified: Secondary | ICD-10-CM | POA: Diagnosis present

## 2020-01-21 DIAGNOSIS — M79672 Pain in left foot: Secondary | ICD-10-CM | POA: Diagnosis present

## 2020-01-21 DIAGNOSIS — M79671 Pain in right foot: Secondary | ICD-10-CM | POA: Diagnosis present

## 2020-01-21 DIAGNOSIS — Z9981 Dependence on supplemental oxygen: Secondary | ICD-10-CM

## 2020-01-21 DIAGNOSIS — M19042 Primary osteoarthritis, left hand: Secondary | ICD-10-CM | POA: Diagnosis present

## 2020-01-21 DIAGNOSIS — M79604 Pain in right leg: Secondary | ICD-10-CM | POA: Diagnosis present

## 2020-01-21 LAB — CBC
HCT: 37.9 % — ABNORMAL LOW (ref 39.0–52.0)
HCT: 37.4 % — ABNORMAL LOW (ref 39.0–52.0)
Hemoglobin: 12.2 g/dL — ABNORMAL LOW (ref 13.0–17.0)
Hemoglobin: 11.9 g/dL — ABNORMAL LOW (ref 13.0–17.0)
MCH: 31 pg (ref 26.0–34.0)
MCH: 31.1 pg (ref 26.0–34.0)
MCHC: 32.2 g/dL (ref 30.0–36.0)
MCHC: 31.8 g/dL (ref 30.0–36.0)
MCV: 96.2 fL (ref 80.0–100.0)
MCV: 97.7 fL (ref 80.0–100.0)
Platelets: 131 10*3/uL — ABNORMAL LOW (ref 150–400)
Platelets: 127 10*3/uL — ABNORMAL LOW (ref 150–400)
RBC: 3.94 MIL/uL — ABNORMAL LOW (ref 4.22–5.81)
RBC: 3.83 MIL/uL — ABNORMAL LOW (ref 4.22–5.81)
RDW: 14.5 % (ref 11.5–15.5)
RDW: 14.7 % (ref 11.5–15.5)
WBC: 14.9 10*3/uL — ABNORMAL HIGH (ref 4.0–10.5)
WBC: 15.3 10*3/uL — ABNORMAL HIGH (ref 4.0–10.5)
nRBC: 0 % (ref 0.0–0.2)
nRBC: 0 % (ref 0.0–0.2)

## 2020-01-21 LAB — BASIC METABOLIC PANEL
Anion gap: 10 (ref 5–15)
Anion gap: 12 (ref 5–15)
BUN: 20 mg/dL (ref 8–23)
BUN: 19 mg/dL (ref 8–23)
CO2: 27 mmol/L (ref 22–32)
CO2: 26 mmol/L (ref 22–32)
Calcium: 9.4 mg/dL (ref 8.9–10.3)
Calcium: 9.4 mg/dL (ref 8.9–10.3)
Chloride: 99 mmol/L (ref 98–111)
Chloride: 97 mmol/L — ABNORMAL LOW (ref 98–111)
Creatinine, Ser: 1.43 mg/dL — ABNORMAL HIGH (ref 0.61–1.24)
Creatinine, Ser: 1.41 mg/dL — ABNORMAL HIGH (ref 0.61–1.24)
GFR calc Af Amer: 57 mL/min — ABNORMAL LOW (ref >60)
GFR calc Af Amer: 58 mL/min — ABNORMAL LOW (ref >60)
GFR calc non Af Amer: 49 mL/min — ABNORMAL LOW (ref >60)
GFR calc non Af Amer: 50 mL/min — ABNORMAL LOW (ref >60)
Glucose, Bld: 141 mg/dL — ABNORMAL HIGH (ref 70–99)
Glucose, Bld: 146 mg/dL — ABNORMAL HIGH (ref 70–99)
Potassium: 4.3 mmol/L (ref 3.5–5.1)
Potassium: 4.6 mmol/L (ref 3.5–5.1)
Sodium: 136 mmol/L (ref 135–145)
Sodium: 135 mmol/L (ref 135–145)

## 2020-01-21 LAB — BRAIN NATRIURETIC PEPTIDE: B Natriuretic Peptide: 334.3 pg/mL — ABNORMAL HIGH (ref 0.0–100.0)

## 2020-01-21 LAB — MAGNESIUM: Magnesium: 2 mg/dL (ref 1.7–2.4)

## 2020-01-21 NOTE — Progress Notes (Signed)
Pt transported to ED per Dr. Haroldine Laws

## 2020-01-21 NOTE — Progress Notes (Signed)
Advanced Heart Failure Clinic Note   Primary Care: Velna Hatchet, MD Primary Cardiologist: Dr. Acie Fredrickson Primary HF: Dr. Haroldine Laws   HPI:  [name] is a 69 y.o. 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   Admitte 02/02/16 with severe DOE with any exercise or walking 10-20 feet. Pt admitted and planned for [facility] 02/05/16. Diuresed with IV lasix with rapid improvement of symptoms. Overall diuresed 15.2 L and down 14 lbs. Discharge weight was 205 lbs. L/RHC 02/05/16 with no significant CAD. RHC showed significant difference between PCWP (50) and LVEDP (30) suggestive of MS but no MS on Echo. Weight at d/c was 205 pounds.  Echo 02/02/16 LVEF 60-65%, Grade 3 DD. Mild AI, Mild MR, RV mildly dilated and severely reduced. RA mildly dilated. PA peak pressure 74 mm Hg. Concerns for restrictive cardiomyopathy.  Admitted 12/19 -> 04/19/16 for Tikosyn load. RHC completed during that admission as below.   Admitted 6/21 with recurrent respiratory failure and lactic acidosis felt to be mostly COPD flare. Had recurrent AF and underwent TEE-DC-CV on 10/19/19. Tikosyn continued   LEFT VENTRICLE: EF = 60% No WMA RIGHT VENTRICLE: Moderately HK LEFT ATRIUM: Moderately to severely dilated. LA diameter 5.1 cm LEFT ATRIAL APPENDAGE: Surgically amputated. No clot in residual small pouch AORTIC VALVE:  Bioprosthetic valve. Functions normally. No AI/AS MITRAL VALVE:    Slight restriction of posterior leaflet 3+ posterior MR TRICUSPID VALVE: Normal severe TR. RVSP ~ 59mmHG INTERATRIAL SEPTUM: No PFO/ASD  Here with his wife. For past few days, very SOB. Feels very cold and having chills - put his winter coat on in clinic. Denies fever. Weight up about 5 pounds. + cough but no productive sputum. Feels congested. No dysuria. No pain. He has continues to refuse the COVID vaccine despite his  wife's urgings.    Studies:  ABI 06/24/18 Normal  PFTs 4/17  FEV1  1.6 (47%) FVC 2.25 (49%) DLCO 54%  L/RHC 02/05/16 - Mid RCA lesion, 10 %stenosed. - Ost Cx to Prox Cx lesion, 20 %stenosed. - LV end diastolic pressure is moderately elevated. - Hemodynamic findings consistent with severe pulmonary hypertension. - No evidence of discordance on the LV/RV tracing. No evidence of pericardial constriction. - Severly elevated PCWP. - PA sat 51%. CO 6.2 L/min. Cardiac index 2.87. - No significant aortic valve gradient. - There is no aortic valve stenosis.  RHC Procedural Findings: Hemodynamics (mmHg) RA mean 30 RV 87/9 (26) PA 100/56 (72) PCWP 51 LVEDP 31 AO 142/73 (100) Cardiac Output (Fick) 6.21 Cardiac Index (Fick) 2.87 PVR = 3.5 WU   RHC 04/17/16 RA = 13 RV = 66/3/15 PA = 69/23 (42) PCW = 21 (v=38) Fick cardiac output/index = 6.3/2.9 PVR = 3.8 WU Ao sat = 87% (radial artery ABG) PA sat = 43%, 43% SVC sat = 50%   Past Medical History:  Diagnosis Date  . Aortic stenosis, mild   . Arthritis of hand    "just a little bit in both hands" (03/31/2012)  . Asthma    "little bit" (03/31/2012)  . Atrial fibrillation [facility])    dx '04; DCCV '04, placed on flecainide, failed DCCV 04/2010, flecainide stopped; s/p successful A.fib ablation 01/31/12  . CHF (congestive heart failure) ([place])   . Controlled type 2 diabetes mellitus without complication, without long-term current use of insulin ([place]) 12/13/2019  . COPD (chronic  obstructive pulmonary disease) ([place])   . Diabetes mellitus without complication (HCC)    borderline  . DJD (degenerative joint disease)   . Dyspnea   . Exertional dyspnea   . Fibromyalgia   . First degree AV block    post ablation, ~454ms  . GERD (gastroesophageal reflux disease)   . Heart murmur   . Hyperlipidemia   . Hypertension   . Hypothyroidism    S/P radiation  . Left atrial enlargement    LA size 42mm by echo 11/21/11  . Mitral regurgitation     trivial  . Nausea 12/13/2019  . Obesity   . Obstructive sleep apnea    mild by sleep study 2013; pt stated he does not have a machine because "it wasn't bad enough for him to have one"  . Pacemaker 03/31/2012  . Restless leg syndrome   . Second degree AV block   . Splenomegaly   . Visit for monitoring Tikosyn therapy 04/16/2016    Current Outpatient Medications  Medication Sig Dispense Refill  . albuterol (PROVENTIL HFA;VENTOLIN HFA) 108 (90 Base) MCG/ACT inhaler Inhale 2 puffs into the lungs every 6 (six) hours as needed for wheezing or shortness of breath. 1 Inhaler 5  . amLODipine (NORVASC) 10 MG tablet Take 0.5 tablets (5 mg total) by mouth daily. 30 tablet 11  . apixaban (ELIQUIS) 5 MG TABS tablet TAKE 1 TABLET(5 MG) BY MOUTH TWICE DAILY 180 tablet 3  . bisoprolol (ZEBETA) 5 MG tablet Take 1 tablet (5 mg total) by mouth daily. 30 tablet 2  . Budeson-Glycopyrrol-Formoterol (BREZTRI AEROSPHERE) 160-9-4.8 MCG/ACT AERO Inhale 2 puffs into the lungs 2 (two) times daily. 4 g 0  . Cholecalciferol (VITAMIN D) 2000 UNITS CAPS Take 8,000 Units by mouth at bedtime.     . dofetilide (TIKOSYN) 250 MCG capsule Take 1 capsule (250 mcg total) by mouth 2 (two) times daily. 180 capsule 1  . Ensure Max Protein (ENSURE MAX PROTEIN) LIQD Take 330 mLs (11 oz total) by mouth 2 (two) times daily. 3300 mL 0  . fexofenadine (ALLEGRA) 180 MG tablet Take 1 tablet (180 mg total) by mouth daily. (Patient taking differently: Take 180 mg by mouth daily as needed for allergies or rhinitis. ) 30 tablet 1  . gabapentin (NEURONTIN) 300 MG capsule Take 600-1,200 mg by mouth See admin instructions. Take 2 capsules ( 600 mg) by mouth in the morning and  at lunchtime, and take 4 capsules (1,200 mg) at bedtime  3  . HYDROcodone-acetaminophen (NORCO/VICODIN) 5-325 MG tablet Take 1 tablet by mouth 2 (two) times daily as needed (pain).     [name] levothyroxine (SYNTHROID, LEVOTHROID) 175 MCG tablet Take 175 mcg by mouth daily  before breakfast.    . MAGNESIUM PO Take 400 mcg by mouth at bedtime. Has zinc/calcium    . Multiple Vitamin (MULTIVITAMIN WITH MINERALS) TABS tablet Take 1 tablet by mouth daily. 30 tablet 0  . ondansetron (ZOFRAN ODT) 4 MG disintegrating tablet Take 1 tablet (4 mg total) by mouth every 8 (eight) hours as needed. 20 tablet 0  . OXYGEN Inhale 2 L into the lungs continuous.    . pantoprazole (PROTONIX) 40 MG tablet Take 40 mg by mouth daily.    . polyethylene glycol powder (GLYCOLAX/MIRALAX) powder Take 17 g by mouth daily. (Patient taking differently: Take 17 g by mouth daily as needed for mild constipation. ) 500 g 0  . potassium chloride SA (KLOR-CON) 20 MEQ tablet Take 1 tablet (20 mEq total)  by mouth 2 (two) times daily. Needs appt for further refills 180 tablet 0  . predniSONE (DELTASONE) 5 MG tablet Take 1 tablet (5 mg total) by mouth daily with breakfast. 30 tablet 3  . rOPINIRole (REQUIP) 1 MG tablet Take 1 tablet (1 mg total) by mouth daily. (Patient taking differently: Take 1 mg by mouth in the morning and at bedtime. ) 90 tablet 2  . rosuvastatin (CRESTOR) 10 MG tablet Take 10 mg by mouth at bedtime.     [name] spironolactone (ALDACTONE) 25 MG tablet Take 1 tablet (25 mg total) by mouth daily. 90 tablet 3  . torsemide (DEMADEX) 20 MG tablet Take 1 tablet (20 mg total) by mouth every other day. 45 tablet 3  . traMADol-acetaminophen (ULTRACET) 37.5-325 MG tablet Take 2 tablets by mouth 2 (two) times daily.    . traZODone (DESYREL) 50 MG tablet Take 12.5 mg by mouth at bedtime.      No current facility-administered medications for this encounter.    Allergies  Allergen Reactions  . Meloxicam Rash  . 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 syndrome 09/02/15, resolved with diphenhydramine and slowing of rate      Social History   Socioeconomic History  . Marital status: Married    Spouse name: Not on file  . Number of children: Not on file  . Years of education: Not on file  .  Highest education level: Not on file  Occupational History  . Not on file  Tobacco Use  . Smoking status: Former Smoker    Packs/day: 0.50    Years: 15.00    Pack years: 7.50    Types: Cigarettes    Quit date: 07/18/1991    Years since quitting: 28.5  . Smokeless tobacco: Never Used  Vaping Use  . Vaping Use: Never used  Substance and Sexual Activity  . Alcohol use: Yes    Comment: occasional  . Drug use: Yes    Types: Marijuana    Comment: 02/2019  . Sexual activity: Not Currently  Other Topics Concern  . Not on file  Social History Narrative   Lives in [place] with spouse.  Works as a Air traffic controller.   Social Determinants of Health   Financial Resource Strain:   . Difficulty of Paying Living Expenses: Not on file  Food Insecurity:   . Worried About Charity fundraiser in the Last Year: Not on file  . Ran Out of Food in the Last Year: Not on file  Transportation Needs:   . Lack of Transportation (Medical): Not on file  . Lack of Transportation (Non-Medical): Not on file  Physical Activity:   . Days of Exercise per Week: Not on file  . Minutes of Exercise per Session: Not on file  Stress:   . Feeling of Stress : Not on file  Social Connections:   . Frequency of Communication with Friends and Family: Not on file  . Frequency of Social Gatherings with Friends and Family: Not on file  . Attends Religious Services: Not on file  . Active Member of Clubs or Organizations: Not on file  . Attends Archivist Meetings: Not on file  . Marital Status: Not on file  Intimate Partner Violence:   . Fear of Current or Ex-Partner: Not on file  . Emotionally Abused: Not on file  . Physically Abused: Not on file  . Sexually Abused: Not on file    Family History  Problem Relation  Age of Onset  . Heart failure Mother   . Stroke Father     Vitals:   01/21/20 1536  BP: (!) 150/82  Pulse: 70  SpO2: 97%  Weight: 90 kg (198 lb 6.4 oz)  Height: 5\' 11"  (1.803  m)    Wt Readings from Last 3 Encounters:  01/21/20 90 kg (198 lb 6.4 oz)  01/10/20 87.1 kg (192 lb)  12/14/19 87.2 kg (192 lb 3.9 oz)     PHYSICAL EXAM: General:  Ill appearing. Wearing winter coat. + chills  SOB at rest HEENT: normal Neck: supple. JVP to jaw Carotids 2+ bilat; + bruits. No lymphadenopathy or thryomegaly appreciated. Cor: PMI nondisplaced. Regular rate & rhythm. 2/6 AS  Lungs: markedly decreased + mild wheeze Abdomen: soft, nontender, + distended. No hepatosplenomegaly. No bruits or masses. Good bowel sounds. Extremities: no cyanosis, clubbing, rash, 2+  edema Neuro: alert & orientedx3, cranial nerves grossly intact. moves all 4 extremities w/o difficulty. Affect pleasant   ASSESSMENT & PLAN:   1. SOB/chills - I am concerned about underlying infection/COPD flare. COVID also a concern - was initially considering treating as an outpatient but WBC back at 15.9k and patient with ongoing chills.  - will send to ED for further evaluation   2. Acute on chronic diastolic CHF with RV failure - Echo 02/02/16 LVEF 60-65%, Grade 3 DD. Mild AI, Mild MR, RV mildly dilated and severely reduced. RA mildly dilated. PA peak pressure 74 mm Hg. Concerns for restrictive cardiomyopathy. - Echo 02/08/16 LVEF 55-60%, Restrictive physiology, (?)RV normal. No assessment of PA pressures. - Echo 7/19 EF 60-65% AVR ok  - TEE 6/21 EF 60% RV moderately HK septal flattening RVSP 90s - Volume status currently elevated. REDS 41%. - This is likely contributing to his SOB and will need IV diuresis once admittes - On d/c can resume torsemide 40 mg qod (decreased due to AKI). - Continue spiro 25 mg daily.   - Reinforced fluid restriction to < 2 L daily, sodium restriction to less than 2000 mg daily, and the importance of daily weights.   - May eventually need repeat RHC  3. Paroxysmal Afib s/p AF ablation/Maze s/p pacemaker - Followed by Dr. Rayann Heman 05/20/16. - Recurrent PAF in setting of COPD  flare  - TEE-DC-CV on 10/19/19. - Remains in NSR on Tikosyn - Continue Eliquis 5 mg BID. No bleeding  4. AS s/p AVR - Stable bioprosthesis on TEE 6/21. No change to current plan.    5 Pulmonary HTN - suspect pulmonary venous hypertension - RHC in 10/17 with pulmonary venous HTN primarily. No change. - TEE 6/21 EF 60% RV moderately HK septal flattening RVSP 90s in setting of COPD flare and PAF. Plan as above  6. Mild CAD by cath 01/2016 - Medical management. Continue atorvastatin 40 mg daily - Denies s/s ischemia  7. Acute on Chronic respiratory failure with hypoxia - No longer on home O2.  - Follows up with pulmonary.    8. HTN - Blood pressure well controlled. Continue current regimen.  9. DM2 - Consider SGLT2i   Total time spent 35 minutes. Over half that time spent discussing above.   Glori Bickers, MD  01/21/20

## 2020-01-21 NOTE — Addendum Note (Signed)
Encounter addended by: Malena Edman, RN on: 01/21/2020 5:16 PM  Actions taken: Clinical Note Signed

## 2020-01-21 NOTE — Progress Notes (Signed)
ReDS Vest / Clip - 01/21/20 1536      ReDS Vest / Clip   Station Marker D    Ruler Value 35    ReDS Value Range High volume overload    ReDS Actual Value 41    Anatomical Comments Sitting

## 2020-01-21 NOTE — ED Triage Notes (Signed)
Pt reports going to pcp today due to not feeling well and having sob. Was sent here due to possible heart failure and fluid buildup. spo2 100% on 2L [place]. No acute distress noted at this time. Speaking in full sentences.

## 2020-01-22 ENCOUNTER — Inpatient Hospital Stay (HOSPITAL_COMMUNITY): Payer: Medicare Other

## 2020-01-22 ENCOUNTER — Encounter (HOSPITAL_COMMUNITY): Payer: Self-pay | Admitting: Internal Medicine

## 2020-01-22 ENCOUNTER — Emergency Department (HOSPITAL_COMMUNITY): Payer: Medicare Other

## 2020-01-22 DIAGNOSIS — N1832 Chronic kidney disease, stage 3b: Secondary | ICD-10-CM | POA: Diagnosis present

## 2020-01-22 DIAGNOSIS — I480 Paroxysmal atrial fibrillation: Secondary | ICD-10-CM | POA: Diagnosis present

## 2020-01-22 DIAGNOSIS — G8929 Other chronic pain: Secondary | ICD-10-CM | POA: Diagnosis present

## 2020-01-22 DIAGNOSIS — I5043 Acute on chronic combined systolic (congestive) and diastolic (congestive) heart failure: Secondary | ICD-10-CM | POA: Diagnosis present

## 2020-01-22 DIAGNOSIS — G2581 Restless legs syndrome: Secondary | ICD-10-CM | POA: Diagnosis present

## 2020-01-22 DIAGNOSIS — I5033 Acute on chronic diastolic (congestive) heart failure: Secondary | ICD-10-CM | POA: Diagnosis present

## 2020-01-22 DIAGNOSIS — J9611 Chronic respiratory failure with hypoxia: Secondary | ICD-10-CM | POA: Diagnosis present

## 2020-01-22 DIAGNOSIS — M79605 Pain in left leg: Secondary | ICD-10-CM | POA: Diagnosis present

## 2020-01-22 DIAGNOSIS — R0602 Shortness of breath: Secondary | ICD-10-CM | POA: Diagnosis present

## 2020-01-22 DIAGNOSIS — D696 Thrombocytopenia, unspecified: Secondary | ICD-10-CM | POA: Diagnosis present

## 2020-01-22 DIAGNOSIS — E1122 Type 2 diabetes mellitus with diabetic chronic kidney disease: Secondary | ICD-10-CM | POA: Diagnosis present

## 2020-01-22 DIAGNOSIS — M797 Fibromyalgia: Secondary | ICD-10-CM | POA: Diagnosis present

## 2020-01-22 DIAGNOSIS — M79671 Pain in right foot: Secondary | ICD-10-CM | POA: Diagnosis present

## 2020-01-22 DIAGNOSIS — E785 Hyperlipidemia, unspecified: Secondary | ICD-10-CM | POA: Diagnosis present

## 2020-01-22 DIAGNOSIS — G4733 Obstructive sleep apnea (adult) (pediatric): Secondary | ICD-10-CM | POA: Diagnosis present

## 2020-01-22 DIAGNOSIS — M19042 Primary osteoarthritis, left hand: Secondary | ICD-10-CM | POA: Diagnosis present

## 2020-01-22 DIAGNOSIS — M79672 Pain in left foot: Secondary | ICD-10-CM | POA: Diagnosis present

## 2020-01-22 DIAGNOSIS — E1165 Type 2 diabetes mellitus with hyperglycemia: Secondary | ICD-10-CM | POA: Diagnosis present

## 2020-01-22 DIAGNOSIS — J441 Chronic obstructive pulmonary disease with (acute) exacerbation: Secondary | ICD-10-CM | POA: Diagnosis present

## 2020-01-22 DIAGNOSIS — E039 Hypothyroidism, unspecified: Secondary | ICD-10-CM | POA: Diagnosis present

## 2020-01-22 DIAGNOSIS — R109 Unspecified abdominal pain: Secondary | ICD-10-CM | POA: Diagnosis present

## 2020-01-22 DIAGNOSIS — K219 Gastro-esophageal reflux disease without esophagitis: Secondary | ICD-10-CM | POA: Diagnosis present

## 2020-01-22 DIAGNOSIS — I2510 Atherosclerotic heart disease of native coronary artery without angina pectoris: Secondary | ICD-10-CM | POA: Diagnosis present

## 2020-01-22 DIAGNOSIS — J4541 Moderate persistent asthma with (acute) exacerbation: Secondary | ICD-10-CM | POA: Diagnosis present

## 2020-01-22 DIAGNOSIS — Z20822 Contact with and (suspected) exposure to covid-19: Secondary | ICD-10-CM | POA: Diagnosis present

## 2020-01-22 DIAGNOSIS — M19041 Primary osteoarthritis, right hand: Secondary | ICD-10-CM | POA: Diagnosis present

## 2020-01-22 DIAGNOSIS — I130 Hypertensive heart and chronic kidney disease with heart failure and stage 1 through stage 4 chronic kidney disease, or unspecified chronic kidney disease: Secondary | ICD-10-CM | POA: Diagnosis present

## 2020-01-22 LAB — URINALYSIS, ROUTINE W REFLEX MICROSCOPIC
Bilirubin Urine: NEGATIVE
Glucose, UA: NEGATIVE mg/dL
Hgb urine dipstick: NEGATIVE
Ketones, ur: NEGATIVE mg/dL
Leukocytes,Ua: NEGATIVE
Nitrite: NEGATIVE
Protein, ur: 30 mg/dL — AB
Specific Gravity, Urine: 1.015 (ref 1.005–1.030)
pH: 6 (ref 5.0–8.0)

## 2020-01-22 LAB — HEPATIC FUNCTION PANEL
ALT: 32 U/L (ref 0–44)
AST: 19 U/L (ref 15–41)
Albumin: 3.8 g/dL (ref 3.5–5.0)
Alkaline Phosphatase: 50 U/L (ref 38–126)
Bilirubin, Direct: 0.4 mg/dL — ABNORMAL HIGH (ref 0.0–0.2)
Indirect Bilirubin: 1.2 mg/dL — ABNORMAL HIGH (ref 0.3–0.9)
Total Bilirubin: 1.6 mg/dL — ABNORMAL HIGH (ref 0.3–1.2)
Total Protein: 7.1 g/dL (ref 6.5–8.1)

## 2020-01-22 LAB — RESPIRATORY PANEL BY RT PCR (FLU A&B, COVID)
Influenza A by PCR: NEGATIVE
Influenza B by PCR: NEGATIVE
SARS Coronavirus 2 by RT PCR: NEGATIVE

## 2020-01-22 LAB — TROPONIN I (HIGH SENSITIVITY)
Troponin I (High Sensitivity): 22 ng/L — ABNORMAL HIGH (ref <18)
Troponin I (High Sensitivity): 22 ng/L — ABNORMAL HIGH (ref <18)

## 2020-01-22 LAB — BRAIN NATRIURETIC PEPTIDE: B Natriuretic Peptide: 271.6 pg/mL — ABNORMAL HIGH (ref 0.0–100.0)

## 2020-01-22 LAB — GLUCOSE, CAPILLARY
Glucose-Capillary: 333 mg/dL — ABNORMAL HIGH (ref 70–99)
Glucose-Capillary: 186 mg/dL — ABNORMAL HIGH (ref 70–99)

## 2020-01-22 LAB — TSH: TSH: 0.044 u[IU]/mL — ABNORMAL LOW (ref 0.350–4.500)

## 2020-01-22 LAB — D-DIMER, QUANTITATIVE: D-Dimer, Quant: 0.36 ug/mL-FEU (ref 0.00–0.50)

## 2020-01-22 MED ORDER — SODIUM CHLORIDE 0.9% FLUSH
3.0000 mL | Freq: Two times a day (BID) | INTRAVENOUS | Status: DC
  Administered 2020-01-22 – 2020-01-23 (×3): 3 mL via INTRAVENOUS

## 2020-01-22 MED ORDER — AMLODIPINE BESYLATE 5 MG PO TABS
5.0000 mg | ORAL_TABLET | Freq: Every day | ORAL | Status: DC
  Administered 2020-01-22 – 2020-01-23 (×2): 5 mg via ORAL
  Filled 2020-01-22 (×2): qty 1

## 2020-01-22 MED ORDER — FUROSEMIDE 10 MG/ML IJ SOLN
60.0000 mg | Freq: Once | INTRAMUSCULAR | Status: AC
  Administered 2020-01-22: 60 mg via INTRAVENOUS
  Filled 2020-01-22: qty 6

## 2020-01-22 MED ORDER — LEVOTHYROXINE SODIUM 75 MCG PO TABS
175.0000 ug | ORAL_TABLET | Freq: Every day | ORAL | Status: DC
  Administered 2020-01-23: 175 ug via ORAL
  Filled 2020-01-22: qty 1

## 2020-01-22 MED ORDER — AEROCHAMBER PLUS FLO-VU LARGE MISC
1.0000 | Freq: Once | Status: AC
  Administered 2020-01-22: 1

## 2020-01-22 MED ORDER — TRAZODONE HCL 50 MG PO TABS
25.0000 mg | ORAL_TABLET | Freq: Every day | ORAL | Status: DC
  Administered 2020-01-22: 25 mg via ORAL
  Filled 2020-01-22: qty 1

## 2020-01-22 MED ORDER — APIXABAN 5 MG PO TABS
5.0000 mg | ORAL_TABLET | Freq: Two times a day (BID) | ORAL | Status: DC
  Administered 2020-01-22 – 2020-01-23 (×3): 5 mg via ORAL
  Filled 2020-01-22 (×3): qty 1

## 2020-01-22 MED ORDER — IOHEXOL 300 MG/ML  SOLN
80.0000 mL | Freq: Once | INTRAMUSCULAR | Status: AC | PRN
  Administered 2020-01-22: 80 mL via INTRAVENOUS

## 2020-01-22 MED ORDER — BISOPROLOL FUMARATE 5 MG PO TABS
5.0000 mg | ORAL_TABLET | Freq: Every day | ORAL | Status: DC
  Administered 2020-01-22 – 2020-01-23 (×2): 5 mg via ORAL
  Filled 2020-01-22 (×2): qty 1

## 2020-01-22 MED ORDER — POLYETHYLENE GLYCOL 3350 17 G PO PACK: 17.0000 g | PACK | Freq: Every day | ORAL | Status: DC | PRN

## 2020-01-22 MED ORDER — ROPINIROLE HCL 1 MG PO TABS
1.0000 mg | ORAL_TABLET | Freq: Two times a day (BID) | ORAL | Status: DC
  Administered 2020-01-22 – 2020-01-23 (×3): 1 mg via ORAL
  Filled 2020-01-22 (×4): qty 1

## 2020-01-22 MED ORDER — ACETAMINOPHEN 650 MG RE SUPP: 650.0000 mg | Freq: Four times a day (QID) | RECTAL | Status: DC | PRN

## 2020-01-22 MED ORDER — TRAMADOL-ACETAMINOPHEN 37.5-325 MG PO TABS
2.0000 | ORAL_TABLET | Freq: Two times a day (BID) | ORAL | Status: DC
  Administered 2020-01-22 – 2020-01-23 (×2): 2 via ORAL
  Filled 2020-01-22 (×2): qty 2

## 2020-01-22 MED ORDER — INSULIN ASPART 100 UNIT/ML ~~LOC~~ SOLN
0.0000 [IU] | Freq: Three times a day (TID) | SUBCUTANEOUS | Status: DC
  Administered 2020-01-22: 11 [IU] via SUBCUTANEOUS
  Administered 2020-01-23: 5 [IU] via SUBCUTANEOUS

## 2020-01-22 MED ORDER — PREDNISONE 5 MG PO TABS
5.0000 mg | ORAL_TABLET | Freq: Every day | ORAL | Status: DC
  Administered 2020-01-23: 5 mg via ORAL
  Filled 2020-01-22: qty 1

## 2020-01-22 MED ORDER — ACETAMINOPHEN 325 MG PO TABS: 650.0000 mg | ORAL_TABLET | Freq: Four times a day (QID) | ORAL | Status: DC | PRN

## 2020-01-22 MED ORDER — SPIRONOLACTONE 25 MG PO TABS
25.0000 mg | ORAL_TABLET | Freq: Every day | ORAL | Status: DC
  Administered 2020-01-23: 25 mg via ORAL
  Filled 2020-01-22: qty 1

## 2020-01-22 MED ORDER — UMECLIDINIUM BROMIDE 62.5 MCG/INH IN AEPB
1.0000 | INHALATION_SPRAY | Freq: Every day | RESPIRATORY_TRACT | Status: DC
  Administered 2020-01-23: 08:00:00 1 via RESPIRATORY_TRACT
  Filled 2020-01-22: qty 7

## 2020-01-22 MED ORDER — PANTOPRAZOLE SODIUM 40 MG PO TBEC
40.0000 mg | DELAYED_RELEASE_TABLET | Freq: Every day | ORAL | Status: DC
  Administered 2020-01-22 – 2020-01-23 (×2): 40 mg via ORAL
  Filled 2020-01-22 (×2): qty 1

## 2020-01-22 MED ORDER — ROSUVASTATIN CALCIUM 5 MG PO TABS
10.0000 mg | ORAL_TABLET | Freq: Every day | ORAL | Status: DC
  Administered 2020-01-22: 10 mg via ORAL
  Filled 2020-01-22: qty 2

## 2020-01-22 MED ORDER — TORSEMIDE 20 MG PO TABS
20.0000 mg | ORAL_TABLET | ORAL | Status: DC
  Administered 2020-01-23: 20 mg via ORAL
  Filled 2020-01-22: qty 1

## 2020-01-22 MED ORDER — DOCUSATE SODIUM 100 MG PO CAPS
100.0000 mg | ORAL_CAPSULE | Freq: Two times a day (BID) | ORAL | Status: DC
  Administered 2020-01-22 – 2020-01-23 (×3): 100 mg via ORAL
  Filled 2020-01-22 (×3): qty 1

## 2020-01-22 MED ORDER — ONDANSETRON HCL 4 MG PO TABS: 4.0000 mg | ORAL_TABLET | Freq: Four times a day (QID) | ORAL | Status: DC | PRN

## 2020-01-22 MED ORDER — GABAPENTIN 300 MG PO CAPS
600.0000 mg | ORAL_CAPSULE | Freq: Three times a day (TID) | ORAL | Status: DC
  Administered 2020-01-22 – 2020-01-23 (×3): 600 mg via ORAL
  Filled 2020-01-22 (×3): qty 2

## 2020-01-22 MED ORDER — METHYLPREDNISOLONE SODIUM SUCC 125 MG IJ SOLR
125.0000 mg | Freq: Once | INTRAMUSCULAR | Status: AC
  Administered 2020-01-22: 125 mg via INTRAVENOUS
  Filled 2020-01-22: qty 2

## 2020-01-22 MED ORDER — DOFETILIDE 250 MCG PO CAPS: 250.0000 ug | ORAL_CAPSULE | Freq: Two times a day (BID) | ORAL | Status: DC

## 2020-01-22 MED ORDER — ALBUTEROL SULFATE HFA 108 (90 BASE) MCG/ACT IN AERS
2.0000 | INHALATION_SPRAY | Freq: Four times a day (QID) | RESPIRATORY_TRACT | Status: DC | PRN
  Filled 2020-01-22: qty 6.7

## 2020-01-22 MED ORDER — DOFETILIDE 250 MCG PO CAPS
250.0000 ug | ORAL_CAPSULE | Freq: Once | ORAL | Status: AC
  Administered 2020-01-22: 250 ug via ORAL
  Filled 2020-01-22: qty 1

## 2020-01-22 MED ORDER — FUROSEMIDE 10 MG/ML IJ SOLN
40.0000 mg | Freq: Once | INTRAMUSCULAR | Status: AC
  Administered 2020-01-22: 40 mg via INTRAVENOUS
  Filled 2020-01-22: qty 4

## 2020-01-22 MED ORDER — ONDANSETRON HCL 4 MG/2ML IJ SOLN
4.0000 mg | Freq: Once | INTRAMUSCULAR | Status: AC
  Administered 2020-01-22: 4 mg via INTRAVENOUS
  Filled 2020-01-22: qty 2

## 2020-01-22 MED ORDER — BISACODYL 5 MG PO TBEC: 5.0000 mg | DELAYED_RELEASE_TABLET | Freq: Every day | ORAL | Status: DC | PRN

## 2020-01-22 MED ORDER — ONDANSETRON HCL 4 MG/2ML IJ SOLN: 4.0000 mg | Freq: Four times a day (QID) | INTRAMUSCULAR | Status: DC | PRN

## 2020-01-22 MED ORDER — ALBUTEROL SULFATE HFA 108 (90 BASE) MCG/ACT IN AERS
6.0000 | INHALATION_SPRAY | Freq: Once | RESPIRATORY_TRACT | Status: AC
  Administered 2020-01-22: 6 via RESPIRATORY_TRACT
  Filled 2020-01-22: qty 6.7

## 2020-01-22 MED ORDER — POLYETHYLENE GLYCOL 3350 17 GM/SCOOP PO POWD
17.0000 g | Freq: Every day | ORAL | Status: DC | PRN
  Filled 2020-01-22: qty 255

## 2020-01-22 MED ORDER — FLUTICASONE FUROATE-VILANTEROL 100-25 MCG/INH IN AEPB
1.0000 | INHALATION_SPRAY | Freq: Every day | RESPIRATORY_TRACT | Status: DC
  Administered 2020-01-23: 08:00:00 1 via RESPIRATORY_TRACT
  Filled 2020-01-22: qty 28

## 2020-01-22 MED ORDER — IPRATROPIUM BROMIDE HFA 17 MCG/ACT IN AERS
2.0000 | INHALATION_SPRAY | Freq: Once | RESPIRATORY_TRACT | Status: AC
  Administered 2020-01-22: 2 via RESPIRATORY_TRACT
  Filled 2020-01-22: qty 12.9

## 2020-01-22 MED ORDER — HYDROCODONE-ACETAMINOPHEN 5-325 MG PO TABS: 1.0000 | ORAL_TABLET | Freq: Two times a day (BID) | ORAL | Status: DC | PRN

## 2020-01-22 MED ORDER — HYDROMORPHONE HCL 1 MG/ML IJ SOLN
0.5000 mg | Freq: Once | INTRAMUSCULAR | Status: AC
  Administered 2020-01-22: 0.5 mg via INTRAVENOUS
  Filled 2020-01-22: qty 1

## 2020-01-22 MED ORDER — MORPHINE SULFATE (PF) 2 MG/ML IV SOLN
2.0000 mg | INTRAVENOUS | Status: DC | PRN
  Administered 2020-01-22: 2 mg via INTRAVENOUS
  Filled 2020-01-22: qty 1

## 2020-01-22 MED ORDER — ALBUTEROL (5 MG/ML) CONTINUOUS INHALATION SOLN
10.0000 mg/h | INHALATION_SOLUTION | RESPIRATORY_TRACT | Status: DC
  Administered 2020-01-22: 10 mg/h via RESPIRATORY_TRACT
  Filled 2020-01-22: qty 20

## 2020-01-22 MED ORDER — DOFETILIDE 250 MCG PO CAPS
250.0000 ug | ORAL_CAPSULE | Freq: Once | ORAL | Status: AC
  Administered 2020-01-23: 250 ug via ORAL
  Filled 2020-01-22: qty 1

## 2020-01-22 NOTE — ED Notes (Signed)
Attempted again to give report but floor is unable to take report.

## 2020-01-22 NOTE — ED Notes (Signed)
Pt to CT at this time.

## 2020-01-22 NOTE — ED Provider Notes (Signed)
[facility] EMERGENCY DEPARTMENT Provider Note  CSN: [id number] Arrival date & time: 01/21/20 1710  Chief Complaint(s) Shortness of Breath  HPI [name] is a 69 y.o. male with extensive past medical history listed below including COPD, asthma, CHF who presents from heart failure clinic.  Patient went there to be evaluated reporting that he has had several days of increased shortness of breath with chills.  Patient has also had a 5 pound weight gain but has had decreased oral intake.  Reports he has had a nonproductive cough.  No chest pain.  No abdominal pain.  No dysuria.  Complains of generalized pain in all of his joints from chronic pain.   HPI  Past Medical History Past Medical History:  Diagnosis Date  . Aortic stenosis, mild   . Arthritis of hand    "just a little bit in both hands" (03/31/2012)  . Asthma    "little bit" (03/31/2012)  . Atrial fibrillation [facility])    dx '04; DCCV '04, placed on flecainide, failed DCCV 04/2010, flecainide stopped; s/p successful A.fib ablation 01/31/12  . CHF (congestive heart failure) ([place])   . Controlled type 2 diabetes mellitus without complication, without long-term current use of insulin ([place]) 12/13/2019  . COPD (chronic obstructive pulmonary disease) ([place])   . Diabetes mellitus without complication (HCC)    borderline  . DJD (degenerative joint disease)   . Dyspnea   . Exertional dyspnea   . Fibromyalgia   . First degree AV block    post ablation, ~427ms  . GERD (gastroesophageal reflux disease)   . Heart murmur   . Hyperlipidemia   . Hypertension   . Hypothyroidism    S/P radiation  . Left atrial enlargement    LA size 35mm by echo 11/21/11  . Mitral regurgitation    trivial  . Nausea 12/13/2019  . Obesity   . Obstructive sleep apnea    mild by sleep study 2013; pt stated he does not have a machine because "it wasn't bad enough for him to have one"  . Pacemaker 03/31/2012  . Restless leg syndrome   .  Second degree AV block   . Splenomegaly   . Visit for monitoring Tikosyn therapy 04/16/2016   Patient Active Problem List   Diagnosis Date Noted  . Hypothyroidism 12/13/2019  . Controlled type 2 diabetes mellitus with stage 3 chronic kidney disease, without long-term current use of insulin ([place]) 12/13/2019  . Chronic kidney disease, stage 3b 12/13/2019  . Acute bacterial bronchitis 12/13/2019  . COPD exacerbation ([place]) 12/13/2019  . COPD with acute exacerbation ([place]) 12/12/2019  . COPD (chronic obstructive pulmonary disease) ([place]) 10/16/2019  . Typical atrial flutter ([place])   . Chronic respiratory failure with hypoxia ([place]) 05/23/2016  . CAD (coronary artery disease) 05/23/2016  . Persistent atrial fibrillation ([place])   . Atypical atrial flutter ([place])   . Visit for monitoring Tikosyn therapy 04/16/2016  . Chronic diastolic CHF (congestive heart failure) ([place]) 02/03/2016  . Cough 12/06/2015  . Hallucinations 09/04/2015  . Lymphadenopathy 09/04/2015  . Ascending aortic aneurysm ([place]) 09/04/2015  . Hypoxia 09/02/2015  . S/P aortic valve replacement with bioprosthetic valve 09/02/2015  . Pleural effusion 09/02/2015  . S/P AVR 08/22/2015  . GERD without esophagitis 09/20/2014  . 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 Dyspnea 09/15/2013  . Pulmonary hypertension ([place]) 09/15/2013  . Shortness of breath 05/05/2013  . 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 Bradycardia 03/31/2012  . Second degree AV block 03/31/2012  . AV  block, 1st degree 02/01/2012  . PAF (paroxysmal atrial fibrillation) ([place]) 11/13/2011  . Fatigue 11/13/2011  . Hypertension 11/13/2011  . Aortic stenosis 11/13/2011  . Sleep apnea 11/13/2011   Home Medication(s) Prior to Admission medications   Medication Sig Start Date End Date Taking? Authorizing Provider  albuterol (PROVENTIL HFA;VENTOLIN HFA) 108 (90 Base) MCG/ACT inhaler Inhale 2 puffs into the lungs every 6 (six) hours as needed for wheezing or shortness of breath. 12/28/15   Yes Collene Gobble, MD  amLODipine (NORVASC) 10 MG tablet Take 0.5 tablets (5 mg total) by mouth daily. 09/18/15  Yes Nahser, Wonda Cheng, MD  apixaban (ELIQUIS) 5 MG TABS tablet TAKE 1 TABLET(5 MG) BY MOUTH TWICE DAILY Patient taking differently: Take 5 mg by mouth 2 (two) times daily. TAKE 1 TABLET(5 MG) BY MOUTH TWICE DAILY 02/25/19  Yes Bensimhon, Shaune Pascal, MD  bisoprolol (ZEBETA) 5 MG tablet Take 5 mg by mouth daily.   Yes [provider]  Budeson-Glycopyrrol-Formoterol (BREZTRI AEROSPHERE) 160-9-4.8 MCG/ACT AERO Inhale 2 puffs into the lungs 2 (two) times daily. 11/05/19  Yes Martyn Ehrich, NP  Cholecalciferol (VITAMIN D) 2000 UNITS CAPS Take 8,000 Units by mouth at bedtime.    Yes [provider]  dofetilide (TIKOSYN) 250 MCG capsule Take 1 capsule (250 mcg total) by mouth 2 (two) times daily. 11/02/19  Yes Sherran Needs, NP  fexofenadine (ALLEGRA) 180 MG tablet Take 1 tablet (180 mg total) by mouth daily. Patient taking differently: Take 180 mg by mouth daily as needed for allergies or rhinitis.  03/01/14  Yes Collene Gobble, MD  gabapentin (NEURONTIN) 300 MG capsule Take 600-1,200 mg by mouth See admin instructions. Take 2 capsules ( 600 mg) by mouth in the morning and  at lunchtime, and take 4 capsules (1,200 mg) at bedtime 09/13/14  Yes [provider]  HYDROcodone-acetaminophen (NORCO/VICODIN) 5-325 MG tablet Take 1 tablet by mouth 2 (two) times daily as needed (pain).    Yes [provider]  levothyroxine (SYNTHROID, LEVOTHROID) 175 MCG tablet Take 175 mcg by mouth daily before breakfast.   Yes [provider]  MAGNESIUM PO Take 400 mcg by mouth at bedtime. Has zinc/calcium   Yes [provider]  Multiple Vitamin (MULTIVITAMIN WITH MINERALS) TABS tablet Take 1 tablet by mouth daily. 12/15/19  Yes Sheikh, Omair Latif, DO  ondansetron (ZOFRAN ODT) 4 MG disintegrating tablet Take 1 tablet (4 mg total) by mouth every 8 (eight) hours as  needed. 12/12/19  Yes Long, Wonda Olds, MD  OXYGEN Inhale 2 L into the lungs continuous.   Yes [provider]  pantoprazole (PROTONIX) 40 MG tablet Take 40 mg by mouth daily.   Yes [provider]  polyethylene glycol powder (GLYCOLAX/MIRALAX) powder Take 17 g by mouth daily. Patient taking differently: Take 17 g by mouth daily as needed for mild constipation.  05/08/18  Yes Enid Derry, [place], DO  potassium chloride SA (KLOR-CON) 20 MEQ tablet Take 1 tablet (20 mEq total) by mouth 2 (two) times daily. Needs appt for further refills 09/06/19  Yes Bensimhon, Shaune Pascal, MD  predniSONE (DELTASONE) 5 MG tablet Take 1 tablet (5 mg total) by mouth daily with breakfast. 12/21/19  Yes Sheikh, Omair Latif, DO  rOPINIRole (REQUIP) 1 MG tablet Take 1 tablet (1 mg total) by mouth daily. Patient taking differently: Take 1 mg by mouth in the morning and at bedtime.  12/24/13  Yes Collene Gobble, MD  rosuvastatin (CRESTOR) 10 MG tablet Take 10  mg by mouth at bedtime.  08/08/16  Yes [provider]  spironolactone (ALDACTONE) 25 MG tablet Take 1 tablet (25 mg total) by mouth daily. 02/16/16  Yes Shirley Friar, PA-C  torsemide (DEMADEX) 20 MG tablet Take 1 tablet (20 mg total) by mouth every other day. 01/17/20  Yes Bensimhon, Shaune Pascal, MD  traMADol-acetaminophen (ULTRACET) 37.5-325 MG tablet Take 2 tablets by mouth 2 (two) times daily.   Yes [provider]  traZODone (DESYREL) 50 MG tablet Take 12.5 mg by mouth at bedtime.    Yes [provider]  Ensure Max Protein (ENSURE MAX PROTEIN) LIQD Take 330 mLs (11 oz total) by mouth 2 (two) times daily. 12/14/19   Kerney Elbe, DO                                                                                                                                    Past Surgical History Past Surgical History:  Procedure Laterality Date  . AORTIC VALVE REPLACEMENT N/A 08/22/2015   Procedure: AORTIC VALVE REPLACEMENT (AVR);   Surgeon: Ivin Poot, MD;  Location: [place];  Service: Open Heart Surgery;  Laterality: N/A;  . ATRIAL FIBRILLATION ABLATION  01/30/2012   PVI by Dr. Rayann Heman  . ATRIAL FIBRILLATION ABLATION N/A 01/31/2012   Procedure: ATRIAL FIBRILLATION ABLATION;  Surgeon: Thompson Grayer, MD;  Location: [facility] CATH LAB;  Service: Cardiovascular;  Laterality: N/A;  . BACK SURGERY     X 3  . CARDIAC CATHETERIZATION N/A 08/09/2015   Procedure: Right/Left Heart Cath and Coronary Angiography;  Surgeon: Peter M [place], MD;  Location: [place] CV LAB;  Service: Cardiovascular;  Laterality: N/A;  . CARDIAC CATHETERIZATION N/A 02/05/2016   Procedure: Right/Left Heart Cath and Coronary Angiography;  Surgeon: Jettie Booze, MD;  Location: [place] CV LAB;  Service: Cardiovascular;  Laterality: N/A;  . CARDIAC CATHETERIZATION N/A 04/17/2016   Procedure: Right Heart Cath;  Surgeon: Jolaine Artist, MD;  Location: [place] CV LAB;  Service: Cardiovascular;  Laterality: N/A;  . CARDIOVASCULAR STRESS TEST  03/12/2002   EF 48%, NO EVIDENCE OF ISCHEMIA  . CARDIOVERSION  01/2012; 03/31/2012  . CARDIOVERSION N/A 02/25/2012   Procedure: CARDIOVERSION;  Surgeon: Thompson Grayer, MD;  Location: [facility] CATH LAB;  Service: Cardiovascular;  Laterality: N/A;  . CARDIOVERSION N/A 03/31/2012   Procedure: CARDIOVERSION;  Surgeon: Thompson Grayer, MD;  Location: [facility] CATH LAB;  Service: Cardiovascular;  Laterality: N/A;  . CARDIOVERSION N/A 11/23/2015   Procedure: CARDIOVERSION;  Surgeon: Larey Dresser, MD;  Location: [facility] ENDOSCOPY;  Service: Cardiovascular;  Laterality: N/A;  . CARDIOVERSION N/A 04/08/2016   Procedure: CARDIOVERSION;  Surgeon: Jolaine Artist, MD;  Location: [facility] ENDOSCOPY;  Service: Cardiovascular;  Laterality: N/A;  . CARDIOVERSION N/A 09/14/2018   Procedure: CARDIOVERSION;  Surgeon: Josue Hector, MD;  Location: [facility] ENDOSCOPY;  Service: Cardiovascular;  Laterality: N/A;  . CARDIOVERSION N/A 10/19/2019  Procedure:  CARDIOVERSION;  Surgeon: Jolaine Artist, MD;  Location: [place];  Service: Cardiovascular;  Laterality: N/A;  . CLIPPING OF ATRIAL APPENDAGE N/A 08/22/2015   Procedure: CLIPPING OF ATRIAL APPENDAGE;  Surgeon: Ivin Poot, MD;  Location: [place];  Service: Open Heart Surgery;  Laterality: N/A;  . DOPPLER ECHOCARDIOGRAPHY  03/11/2002   EF 70-75%  . FINGER SURGERY Left    Middle finger  . FINGER TENDON REPAIR  1980's   "right little finger" (03/31/2012)  . INSERT / REPLACE / REMOVE PACEMAKER     St Jude  . IR RADIOLOGIST EVAL & MGMT  05/06/2019  . IR RADIOLOGIST EVAL & MGMT  10/06/2019  . LEFT AND RIGHT HEART CATHETERIZATION WITH CORONARY ANGIOGRAM N/A 10/27/2012   Procedure: LEFT AND RIGHT HEART CATHETERIZATION WITH CORONARY ANGIOGRAM;  Surgeon: Laverda Page, MD;  Location: [facility] CATH LAB;  Service: Cardiovascular;  Laterality: N/A;  . LUMBAR DISC SURGERY  1980's X2;  2000's  . MAZE N/A 08/22/2015   Procedure: MAZE;  Surgeon: Ivin Poot, MD;  Location: [place];  Service: Open Heart Surgery;  Laterality: N/A;  . PACEMAKER INSERTION  03/31/2012   STJ Accent DR pacemaker implanted by Dr Rayann Heman  . PERMANENT PACEMAKER INSERTION N/A 03/31/2012   Procedure: PERMANENT PACEMAKER INSERTION;  Surgeon: Thompson Grayer, MD;  Location: [facility] CATH LAB;  Service: Cardiovascular;  Laterality: N/A;  . TEE WITHOUT CARDIOVERSION  01/30/2012   Procedure: TRANSESOPHAGEAL ECHOCARDIOGRAM (TEE);  Surgeon: Larey Dresser, MD;  Location: [place];  Service: Cardiovascular;  Laterality: N/A;  ablation next day  . TEE WITHOUT CARDIOVERSION N/A 08/22/2015   Procedure: TRANSESOPHAGEAL ECHOCARDIOGRAM (TEE);  Surgeon: Ivin Poot, MD;  Location: [place];  Service: Open Heart Surgery;  Laterality: N/A;  . TEE WITHOUT CARDIOVERSION N/A 10/19/2019   Procedure: TRANSESOPHAGEAL ECHOCARDIOGRAM (TEE);  Surgeon: Jolaine Artist, MD;  Location: [facility] ENDOSCOPY;  Service: Cardiovascular;  Laterality: N/A;  . US ECHOCARDIOGRAPHY   08/07/2009   EF 55-60%   Family History Family History  Problem Relation Age of Onset  . Heart failure Mother   . Stroke Father     Social History Social History   Tobacco Use  . Smoking status: Former Smoker    Packs/day: 0.50    Years: 15.00    Pack years: 7.50    Types: Cigarettes    Quit date: 07/18/1991    Years since quitting: 28.5  . Smokeless tobacco: Never Used  Vaping Use  . Vaping Use: Never used  Substance Use Topics  . Alcohol use: Yes    Comment: occasional  . Drug use: Yes    Types: Marijuana    Comment: 02/2019   Allergies Meloxicam and Vancomycin  Review of Systems Review of Systems All other systems are reviewed and are negative for acute change except as noted in the HPI  Physical Exam Vital Signs  I have reviewed the triage vital signs BP 135/77   Pulse 64   Temp 98.3 F (36.8 C) (Oral)   Resp 18   Ht 5\' 11"  (1.803 m)   Wt 90 kg   SpO2 99%   BMI 27.67 kg/m   Physical Exam Vitals reviewed.  Constitutional:      General: He is not in acute distress.    Appearance: He is well-developed. He is not diaphoretic.  HENT:     Head: Normocephalic and atraumatic.     Nose: Nose normal.  Eyes:     General: No scleral  icterus.       Right eye: No discharge.        Left eye: No discharge.     Conjunctiva/sclera: Conjunctivae normal.     Pupils: Pupils are equal, round, and reactive to light.  Cardiovascular:     Rate and Rhythm: Normal rate and regular rhythm.     Heart sounds: No murmur heard.  No friction rub. No gallop.   Pulmonary:     Effort: Pulmonary effort is normal. Tachypnea present.     Breath sounds: No stridor. Wheezing and rhonchi present. No rales.     Comments: Pursed lips Abdominal:     General: There is no distension.     Palpations: Abdomen is soft.     Tenderness: There is no abdominal tenderness.  Musculoskeletal:        General: No tenderness.     Cervical back: Normal range of motion and neck supple.      Right lower leg: 2+ Pitting Edema present.     Left lower leg: 2+ Pitting Edema present.  Skin:    General: Skin is warm and dry.     Findings: No erythema or rash.  Neurological:     Mental Status: He is alert and oriented to person, place, and time.     ED Results and Treatments Labs (all labs ordered are listed, but only abnormal results are displayed) Labs Reviewed  BASIC METABOLIC PANEL - Abnormal; Notable for the following components:      Result Value   Chloride 97 (*)    Glucose, Bld 146 (*)    Creatinine, Ser 1.41 (*)    GFR calc non Af Amer 50 (*)    GFR calc Af Amer 58 (*)    All other components within normal limits  CBC - Abnormal; Notable for the following components:   WBC 15.3 (*)    RBC 3.83 (*)    Hemoglobin 11.9 (*)    HCT 37.4 (*)    Platelets 127 (*)    All other components within normal limits  HEPATIC FUNCTION PANEL - Abnormal; Notable for the following components:   Total Bilirubin 1.6 (*)    Bilirubin, Direct 0.4 (*)    Indirect Bilirubin 1.2 (*)    All other components within normal limits  BRAIN NATRIURETIC PEPTIDE - Abnormal; Notable for the following components:   B Natriuretic Peptide 271.6 (*)    All other components within normal limits  URINALYSIS, ROUTINE W REFLEX MICROSCOPIC - Abnormal; Notable for the following components:   Protein, ur 30 (*)    Bacteria, UA RARE (*)    All other components within normal limits  TROPONIN I (HIGH SENSITIVITY) - Abnormal; Notable for the following components:   Troponin I (High Sensitivity) 22 (*)    All other components within normal limits  TROPONIN I (HIGH SENSITIVITY) - Abnormal; Notable for the following components:   Troponin I (High Sensitivity) 22 (*)    All other components within normal limits  RESPIRATORY PANEL BY RT PCR (FLU A&B, COVID)  D-DIMER, QUANTITATIVE (NOT AT [facility])  EKG  EKG Interpretation  Date/Time:  Saturday January 22 2020 02:53:56 EDT Ventricular Rate:  70 PR Interval:    QRS Duration: 201 QT Interval:  503 QTC Calculation: 543 R Axis:   -70 Text Interpretation: Electronic ventricular pacemaker No acute changes Confirmed by Addison Lank [phone]) on 01/22/2020 3:31:49 AM      Radiology DG Chest 2 View  Result Date: 01/22/2020 CLINICAL DATA:  Shortness of breath for a couple of months. EXAM: CHEST - 2 VIEW COMPARISON:  12/12/2019 FINDINGS: Cardiac pacemaker. Postoperative changes in the mediastinum. Borderline heart size with normal pulmonary vascularity. No edema or consolidation. No pleural effusions. No pneumothorax. Mediastinal contours appear intact. Calcification of the aorta. Similar appearance to previous study. IMPRESSION: No active cardiopulmonary disease. Electronically Signed   By: Lucienne Capers M.D.   On: 01/22/2020 00:56   DG Chest 2 View  Result Date: 01/22/2020 CLINICAL DATA:  Patient visited primary care physician due to shortness of breath and was sent in for possible heart failure. EXAM: CHEST - 2 VIEW COMPARISON:  01/21/2020 FINDINGS: Postoperative changes in the mediastinum. Cardiac pacemaker. Normal heart size and pulmonary vascularity. Lungs are clear. No consolidation or edema. No pleural effusions. No pneumothorax. Mediastinal contours appear intact. Calcification of the aorta. Degenerative changes in the spine. IMPRESSION: No active cardiopulmonary disease. Electronically Signed   By: Lucienne Capers M.D.   On: 01/22/2020 00:55    Pertinent labs & imaging results that were available during my care of the patient were reviewed by me and considered in my medical decision making (see chart for details).  Medications Ordered in ED Medications  furosemide (LASIX) injection 60 mg (60 mg Intravenous Given 01/22/20 0206)  HYDROmorphone (DILAUDID) injection 0.5 mg (0.5 mg Intravenous Given 01/22/20 0205)  albuterol  (VENTOLIN HFA) 108 (90 Base) MCG/ACT inhaler 6 puff (6 puffs Inhalation Given 01/22/20 0453)  ipratropium (ATROVENT HFA) inhaler 2 puff (2 puffs Inhalation Given 01/22/20 0453)  AeroChamber Plus Flo-Vu Large MISC 1 each (1 each Other Given 01/22/20 0453)                                                                                                                                    Procedures .1-3 Lead EKG Interpretation Performed by: Fatima Blank, MD Authorized by: Fatima Blank, MD     Interpretation: normal     ECG rate:  98   ECG rate assessment: normal     Rhythm comment:  Ventricularly paced   Ectopy: none     Conduction: normal   .Critical Care Performed by: Fatima Blank, MD Authorized by: Fatima Blank, MD    CRITICAL CARE Performed by: Grayce Sessions Rayola Everhart Total critical care time: 50 minutes Critical care time was exclusive of separately billable procedures and treating other patients. Critical care was necessary to treat or prevent imminent or life-threatening deterioration. Critical care was time spent personally by me on  the following activities: development of treatment plan with patient and/or surrogate as well as nursing, discussions with consultants, evaluation of patient's response to treatment, examination of patient, obtaining history from patient or surrogate, ordering and performing treatments and interventions, ordering and review of laboratory studies, ordering and review of radiographic studies, pulse oximetry and re-evaluation of patient's condition.    (including critical care time)  Medical Decision Making / ED Course I have reviewed the nursing notes for this encounter and the patient's prior records (if available in EHR or on provided paperwork).   [name] was evaluated in Emergency Department on 01/22/2020 for the symptoms described in the history of present illness. He was evaluated in the context of the  global COVID-19 pandemic, which necessitated consideration that the patient might be at risk for infection with the SARS-CoV-2 virus that causes COVID-19. Institutional protocols and algorithms that pertain to the evaluation of patients at risk for COVID-19 are in a state of rapid change based on information released by regulatory bodies including the CDC and federal and state organizations. These policies and algorithms were followed during the patient's care in the ED.  SOB is multifactorial likely from CHF exacerbation and COPD/asthma exacerbation.  Patient on a course of steroids currently at 5 mg.  He does have leukocytosis on CBC, but did not have elevated WBCs 12 days ago while on the steroids.  Chest x-ray without evidence of pneumonia.  Covid/influenza negative.  UA without evidence of infection.  Abdomen benign.  No current source of infection.  Leukocytosis etiology not determined at this time.  Patient was diuresed with IV Lasix in the ED.  Given breathing treatments and steroids.  Patient admitted to medicine for further management.      Final Clinical Impression(s) / ED Diagnoses Final diagnoses:  SOB (shortness of breath)  COPD exacerbation (HCC)  Moderate persistent asthma with exacerbation  Acute on chronic combined systolic and diastolic congestive heart failure ([place])      This chart was dictated using voice recognition software.  Despite best efforts to proofread,  errors can occur which can change the documentation meaning.   Fatima Blank, MD 01/22/20 [phone]

## 2020-01-22 NOTE — Progress Notes (Signed)
Patient had 7 beats VT at 2258; he is asymptomatic. Will continue to monitor patient.

## 2020-01-22 NOTE — H&P (Signed)
History and Physical    [name] YWV:[id number] DOB: 02/24/1951 DOA: 01/21/2020  PCP: Victor Hatchet, MD Consultants:  [name]/[name] - cardiology; [name] - pulmonology Patient coming from:  Home - lives with wife; NOK: Wife, [phone]  Chief Complaint: SOB  HPI: [name] is a 69 y.o. male with medical history significant of RLS; pacemaker placement; OSA; hypothyroidism; HTN; HLD; COPD on 2L home O2; DM; afib on Eliquis; and CHF presenting with SOB.  He saw Dr. Haroldine [name] yesterday and he recommended that he come in due to excess fluid.  He has chronic pain in his feet and legs for which he takes Tramadol and Hydrocodone.  He feels more SOB than usual when he is hurting bad, and he is hurting "real bad" now.  He breathing is usually ok until he significantly exerts himself or gets severe pain.  Dr. B was concerned about fluid around his lungs or heart.  No change in cough.  No chest pain.  +increased wheezing recently but none now.  No fevers.  His pain is usually controlled with his pain medication.    ED Course:  Carryover, per Dr. Alcario [name]:  pt with CHF and COPD. Sent from CHF clinic Dr. Haroldine [name] for admission and diuresing. Think he also has COPD exacerbation. Also 5lbs overweight. So both COPD and CHF exacerbations.   Also has chills and leukocytosis. But eval for infection unimpressive to EDP. CXR fine, COVID neg.   Review of Systems: As per HPI; otherwise review of systems reviewed and negative.   Ambulatory Status:  Ambulates without assistance  COVID Vaccine Status:  None   Past Medical History:  Diagnosis Date  . Aortic stenosis, mild   . Arthritis of hand    "just a little bit in both hands" (03/31/2012)  . Asthma    "little bit" (03/31/2012)  . Atrial fibrillation [facility])    dx '04; DCCV '04, placed on flecainide, failed DCCV 04/2010, flecainide stopped; s/p successful A.fib ablation 01/31/12  . CHF (congestive heart failure) ([place])   . Controlled  type 2 diabetes mellitus without complication, without long-term current use of insulin ([place]) 12/13/2019  . COPD (chronic obstructive pulmonary disease) ([place])   . DJD (degenerative joint disease)   . Fibromyalgia   . First degree AV block    post ablation, ~421ms  . GERD (gastroesophageal reflux disease)   . Hyperlipidemia   . Hypertension   . Hypothyroidism    S/P radiation  . Left atrial enlargement    LA size 19mm by echo 11/21/11  . Mitral regurgitation    trivial  . Obstructive sleep apnea    mild by sleep study 2013; pt stated he does not have a machine because "it wasn't bad enough for him to have one"  . Pacemaker 03/31/2012  . Restless leg syndrome   . Second degree AV block   . Splenomegaly   . Visit for monitoring Tikosyn therapy 04/16/2016    Past Surgical History:  Procedure Laterality Date  . AORTIC VALVE REPLACEMENT N/A 08/22/2015   Procedure: AORTIC VALVE REPLACEMENT (AVR);  Surgeon: Victor Poot, MD;  Location: [place];  Service: Open Heart Surgery;  Laterality: N/A;  . ATRIAL FIBRILLATION ABLATION  01/30/2012   PVI by Dr. Rayann Heman  . ATRIAL FIBRILLATION ABLATION N/A 01/31/2012   Procedure: ATRIAL FIBRILLATION ABLATION;  Surgeon: Thompson Grayer, MD;  Location: [facility] CATH LAB;  Service: Cardiovascular;  Laterality: N/A;  . BACK SURGERY     X 3  . CARDIAC  CATHETERIZATION N/A 08/09/2015   Procedure: Right/Left Heart Cath and Coronary Angiography;  Surgeon: Peter M Martinique, MD;  Location: [place] CV LAB;  Service: Cardiovascular;  Laterality: N/A;  . CARDIAC CATHETERIZATION N/A 02/05/2016   Procedure: Right/Left Heart Cath and Coronary Angiography;  Surgeon: Jettie Booze, MD;  Location: [place] CV LAB;  Service: Cardiovascular;  Laterality: N/A;  . CARDIAC CATHETERIZATION N/A 04/17/2016   Procedure: Right Heart Cath;  Surgeon: Jolaine Artist, MD;  Location: [place] CV LAB;  Service: Cardiovascular;  Laterality: N/A;  . CARDIOVASCULAR STRESS TEST   03/12/2002   EF 48%, NO EVIDENCE OF ISCHEMIA  . CARDIOVERSION  01/2012; 03/31/2012  . CARDIOVERSION N/A 02/25/2012   Procedure: CARDIOVERSION;  Surgeon: Thompson Grayer, MD;  Location: [facility] CATH LAB;  Service: Cardiovascular;  Laterality: N/A;  . CARDIOVERSION N/A 03/31/2012   Procedure: CARDIOVERSION;  Surgeon: Thompson Grayer, MD;  Location: [facility] CATH LAB;  Service: Cardiovascular;  Laterality: N/A;  . CARDIOVERSION N/A 11/23/2015   Procedure: CARDIOVERSION;  Surgeon: Larey Dresser, MD;  Location: [place];  Service: Cardiovascular;  Laterality: N/A;  . CARDIOVERSION N/A 04/08/2016   Procedure: CARDIOVERSION;  Surgeon: Jolaine Artist, MD;  Location: [facility] ENDOSCOPY;  Service: Cardiovascular;  Laterality: N/A;  . CARDIOVERSION N/A 09/14/2018   Procedure: CARDIOVERSION;  Surgeon: Josue Hector, MD;  Location: [facility] ENDOSCOPY;  Service: Cardiovascular;  Laterality: N/A;  . CARDIOVERSION N/A 10/19/2019   Procedure: CARDIOVERSION;  Surgeon: Jolaine Artist, MD;  Location: [place];  Service: Cardiovascular;  Laterality: N/A;  . CLIPPING OF ATRIAL APPENDAGE N/A 08/22/2015   Procedure: CLIPPING OF ATRIAL APPENDAGE;  Surgeon: Victor Poot, MD;  Location: [place];  Service: Open Heart Surgery;  Laterality: N/A;  . DOPPLER ECHOCARDIOGRAPHY  03/11/2002   EF 70-75%  . FINGER SURGERY Left    Middle finger  . FINGER TENDON REPAIR  1980's   "right little finger" (03/31/2012)  . INSERT / REPLACE / REMOVE PACEMAKER     [employer]  . IR RADIOLOGIST EVAL & MGMT  05/06/2019  . IR RADIOLOGIST EVAL & MGMT  10/06/2019  . LEFT AND RIGHT HEART CATHETERIZATION WITH CORONARY ANGIOGRAM N/A 10/27/2012   Procedure: LEFT AND RIGHT HEART CATHETERIZATION WITH CORONARY ANGIOGRAM;  Surgeon: Laverda Page, MD;  Location: [facility] CATH LAB;  Service: Cardiovascular;  Laterality: N/A;  . LUMBAR DISC SURGERY  1980's X2;  2000's  . MAZE N/A 08/22/2015   Procedure: MAZE;  Surgeon: Victor Poot, MD;  Location: [place];  Service: Open  Heart Surgery;  Laterality: N/A;  . PACEMAKER INSERTION  03/31/2012   STJ Accent DR pacemaker implanted by Dr Rayann Heman  . PERMANENT PACEMAKER INSERTION N/A 03/31/2012   Procedure: PERMANENT PACEMAKER INSERTION;  Surgeon: Thompson Grayer, MD;  Location: [facility] CATH LAB;  Service: Cardiovascular;  Laterality: N/A;  . TEE WITHOUT CARDIOVERSION  01/30/2012   Procedure: TRANSESOPHAGEAL ECHOCARDIOGRAM (TEE);  Surgeon: Larey Dresser, MD;  Location: [place];  Service: Cardiovascular;  Laterality: N/A;  ablation next day  . TEE WITHOUT CARDIOVERSION N/A 08/22/2015   Procedure: TRANSESOPHAGEAL ECHOCARDIOGRAM (TEE);  Surgeon: Victor Poot, MD;  Location: [place];  Service: Open Heart Surgery;  Laterality: N/A;  . TEE WITHOUT CARDIOVERSION N/A 10/19/2019   Procedure: TRANSESOPHAGEAL ECHOCARDIOGRAM (TEE);  Surgeon: Jolaine Artist, MD;  Location: [facility] ENDOSCOPY;  Service: Cardiovascular;  Laterality: N/A;  . US ECHOCARDIOGRAPHY  08/07/2009   EF 55-60%    Social History   Socioeconomic History  .  Marital status: Married    Spouse name: Not on file  . Number of children: Not on file  . Years of education: Not on file  . Highest education level: Not on file  Occupational History  . Occupation: retired  Tobacco Use  . Smoking status: Former Smoker    Packs/day: 1.00    Years: 25.00    Pack years: 25.00    Types: Cigarettes    Quit date: 07/18/1991    Years since quitting: 28.5  . Smokeless tobacco: Never Used  Vaping Use  . Vaping Use: Never used  Substance and Sexual Activity  . Alcohol use: Not Currently    Comment: occasional  . Drug use: Yes    Types: Marijuana    Comment: rarely use  . Sexual activity: Not Currently  Other Topics Concern  . Not on file  Social History Narrative   Lives in [place] with spouse.  Works as a Air traffic controller.   Social Determinants of Health   Financial Resource Strain:   . Difficulty of Paying Living Expenses: Not on file  Food Insecurity:   .  Worried About Charity fundraiser in the Last Year: Not on file  . Ran Out of Food in the Last Year: Not on file  Transportation Needs:   . Lack of Transportation (Medical): Not on file  . Lack of Transportation (Non-Medical): Not on file  Physical Activity:   . Days of Exercise per Week: Not on file  . Minutes of Exercise per Session: Not on file  Stress:   . Feeling of Stress : Not on file  Social Connections:   . Frequency of Communication with Friends and Family: Not on file  . Frequency of Social Gatherings with Friends and Family: Not on file  . Attends Religious Services: Not on file  . Active Member of Clubs or Organizations: Not on file  . Attends Archivist Meetings: Not on file  . Marital Status: Not on file  Intimate Partner Violence:   . Fear of Current or Ex-Partner: Not on file  . Emotionally Abused: Not on file  . Physically Abused: Not on file  . Sexually Abused: Not on file    Allergies  Allergen Reactions  . Meloxicam Rash  . 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 syndrome 09/02/15, resolved with diphenhydramine and slowing of rate    Family History  Problem Relation Age of Onset  . Heart failure Mother   . Stroke Father     Prior to Admission medications   Medication Sig Start Date End Date Taking? Authorizing Provider  albuterol (PROVENTIL HFA;VENTOLIN HFA) 108 (90 Base) MCG/ACT inhaler Inhale 2 puffs into the lungs every 6 (six) hours as needed for wheezing or shortness of breath. 12/28/15  Yes Collene Gobble, MD  amLODipine (NORVASC) 10 MG tablet Take 0.5 tablets (5 mg total) by mouth daily. 09/18/15  Yes Nahser, Wonda Cheng, MD  apixaban (ELIQUIS) 5 MG TABS tablet TAKE 1 TABLET(5 MG) BY MOUTH TWICE DAILY Patient taking differently: Take 5 mg by mouth 2 (two) times daily. TAKE 1 TABLET(5 MG) BY MOUTH TWICE DAILY 02/25/19  Yes [name], Shaune Pascal, MD  bisoprolol (ZEBETA) 5 MG tablet Take 5 mg by mouth daily.   Yes [provider]    Budeson-Glycopyrrol-Formoterol (BREZTRI AEROSPHERE) 160-9-4.8 MCG/ACT AERO Inhale 2 puffs into the lungs 2 (two) times daily. 11/05/19  Yes Martyn Ehrich, NP  Cholecalciferol (VITAMIN D) 2000 UNITS CAPS  Take 8,000 Units by mouth at bedtime.    Yes [provider]  dofetilide (TIKOSYN) 250 MCG capsule Take 1 capsule (250 mcg total) by mouth 2 (two) times daily. 11/02/19  Yes Sherran Needs, NP  fexofenadine (ALLEGRA) 180 MG tablet Take 1 tablet (180 mg total) by mouth daily. Patient taking differently: Take 180 mg by mouth daily as needed for allergies or rhinitis.  03/01/14  Yes Collene Gobble, MD  gabapentin (NEURONTIN) 300 MG capsule Take 600-1,200 mg by mouth See admin instructions. Take 2 capsules ( 600 mg) by mouth in the morning and  at lunchtime, and take 4 capsules (1,200 mg) at bedtime 09/13/14  Yes [provider]  HYDROcodone-acetaminophen (NORCO/VICODIN) 5-325 MG tablet Take 1 tablet by mouth 2 (two) times daily as needed (pain).    Yes [provider]  levothyroxine (SYNTHROID, LEVOTHROID) 175 MCG tablet Take 175 mcg by mouth daily before breakfast.   Yes [provider]  MAGNESIUM PO Take 400 mcg by mouth at bedtime. Has zinc/calcium   Yes [provider]  Multiple Vitamin (MULTIVITAMIN WITH MINERALS) TABS tablet Take 1 tablet by mouth daily. 12/15/19  Yes Sheikh, Omair Latif, DO  ondansetron (ZOFRAN ODT) 4 MG disintegrating tablet Take 1 tablet (4 mg total) by mouth every 8 (eight) hours as needed. 12/12/19  Yes Long, Wonda Olds, MD  OXYGEN Inhale 2 L into the lungs continuous.   Yes [provider]  pantoprazole (PROTONIX) 40 MG tablet Take 40 mg by mouth daily.   Yes [provider]  polyethylene glycol powder (GLYCOLAX/MIRALAX) powder Take 17 g by mouth daily. Patient taking differently: Take 17 g by mouth daily as needed for mild constipation.  05/08/18  Yes Enid Derry, Martinique, DO  potassium chloride SA (KLOR-CON) 20 MEQ  tablet Take 1 tablet (20 mEq total) by mouth 2 (two) times daily. Needs appt for further refills 09/06/19  Yes [name], Shaune Pascal, MD  predniSONE (DELTASONE) 5 MG tablet Take 1 tablet (5 mg total) by mouth daily with breakfast. 12/21/19  Yes Sheikh, Omair Latif, DO  rOPINIRole (REQUIP) 1 MG tablet Take 1 tablet (1 mg total) by mouth daily. Patient taking differently: Take 1 mg by mouth in the morning and at bedtime.  12/24/13  Yes Collene Gobble, MD  rosuvastatin (CRESTOR) 10 MG tablet Take 10 mg by mouth at bedtime.  08/08/16  Yes [provider]  spironolactone (ALDACTONE) 25 MG tablet Take 1 tablet (25 mg total) by mouth daily. 02/16/16  Yes Shirley Friar, PA-C  torsemide (DEMADEX) 20 MG tablet Take 1 tablet (20 mg total) by mouth every other day. 01/17/20  Yes [name], Shaune Pascal, MD  traMADol-acetaminophen (ULTRACET) 37.5-325 MG tablet Take 2 tablets by mouth 2 (two) times daily.   Yes [provider]  traZODone (DESYREL) 50 MG tablet Take 12.5 mg by mouth at bedtime.    Yes [provider]  Ensure Max Protein (ENSURE MAX PROTEIN) LIQD Take 330 mLs (11 oz total) by mouth 2 (two) times daily. 12/14/19   Kerney Elbe, DO    Physical Exam: Vitals:   01/22/20 1120 01/22/20 1300 01/22/20 1335 01/22/20 1630  BP: 121/75 133/66 138/60 (!) 141/77  Pulse: 69 70 70 71  Resp: 20 18 18 18   Temp:  98.2 F (36.8 C) 97.8 F (36.6 C) 98 F (36.7 C)  TempSrc:  Oral Oral Oral  SpO2: 97% 98% 97% 98%  Weight:      Height:         .  General:  Appears calm but uncomfortable - shifting legs and reporting chronic pain . Eyes:  PERRL, EOMI, normal lids, iris . ENT:  grossly normal hearing, lips & tongue, mmm; poor dentition . Neck:  no LAD, masses or thyromegaly . Cardiovascular:  RRR, no m/r/g. 1-2+ LE edema.  [name] Respiratory:   CTA bilaterally with no wheezes/rales/rhonchi.  Normal respiratory effort. . Abdomen:  soft, TTP in RLQ > diffusely, ND, NABS . Back:    normal alignment, no CVAT . Skin:  no rash or induration seen on limited exam . Musculoskeletal:  grossly normal tone BUE/BLE, good ROM, no bony abnormality . Psychiatric:  grossly normal mood and affect, speech fluent and appropriate, AOx3 . Neurologic:  CN 2-12 grossly intact, moves all extremities in coordinated fashion    Radiological Exams on Admission: DG Chest 2 View  Result Date: 01/22/2020 CLINICAL DATA:  Shortness of breath for a couple of months. EXAM: CHEST - 2 VIEW COMPARISON:  12/12/2019 FINDINGS: Cardiac pacemaker. Postoperative changes in the mediastinum. Borderline heart size with normal pulmonary vascularity. No edema or consolidation. No pleural effusions. No pneumothorax. Mediastinal contours appear intact. Calcification of the aorta. Similar appearance to previous study. IMPRESSION: No active cardiopulmonary disease. Electronically Signed   By: Lucienne Capers M.D.   On: 01/22/2020 00:56   DG Chest 2 View  Result Date: 01/22/2020 CLINICAL DATA:  Patient visited primary care physician due to shortness of breath and was sent in for possible heart failure. EXAM: CHEST - 2 VIEW COMPARISON:  01/21/2020 FINDINGS: Postoperative changes in the mediastinum. Cardiac pacemaker. Normal heart size and pulmonary vascularity. Lungs are clear. No consolidation or edema. No pleural effusions. No pneumothorax. Mediastinal contours appear intact. Calcification of the aorta. Degenerative changes in the spine. IMPRESSION: No active cardiopulmonary disease. Electronically Signed   By: Lucienne Capers M.D.   On: 01/22/2020 00:55   CT ABDOMEN PELVIS W CONTRAST  Result Date: 01/22/2020 CLINICAL DATA:  69 year old male with acute abdominal and pelvic pain with nausea today. EXAM: CT ABDOMEN AND PELVIS WITH CONTRAST TECHNIQUE: Multidetector CT imaging of the abdomen and pelvis was performed using the standard protocol following bolus administration of intravenous contrast. CONTRAST:  91mL OMNIPAQUE  IOHEXOL 300 MG/ML  SOLN COMPARISON:  12/12/2019 CT and prior studies FINDINGS: Lower chest: No acute abnormality. Cardiomegaly and pacemaker leads noted. Hepatobiliary: Hepatic steatosis and cholelithiasis noted. No biliary dilatation. Pancreas: Unremarkable Spleen: Splenomegaly noted with a splenic volume of 575 cc. No focal splenic lesions identified. Adrenals/Urinary Tract: RIGHT renal atrophy and scarring noted. No significant LEFT renal abnormalities are present. There is no evidence of hydronephrosis or urinary calculi. Stomach/Bowel: Stomach is within normal limits. Appendix appears normal. No evidence of bowel wall thickening, distention, or inflammatory changes. Vascular/Lymphatic: Aortic atherosclerosis. No enlarged abdominal or pelvic lymph nodes. Reproductive: Prostate is unremarkable. Other: Small to moderate umbilical hernia containing fat and moderate LEFT inguinal hernia containing fat again noted. No ascites, abscess or pneumoperitoneum identified. Musculoskeletal: No acute or suspicious bony abnormalities identified. IMPRESSION: 1. No evidence of acute abnormality. 2. Hepatic steatosis and cholelithiasis. 3. Splenomegaly. 4. Small to moderate umbilical hernia containing fat and moderate LEFT inguinal hernia containing fat. 5. Cardiomegaly. 6. Aortic Atherosclerosis (ICD10-I70.0). Electronically Signed   By: Margarette Canada M.D.   On: 01/22/2020 10:34    EKG: Independently reviewed.  Ventricularly paced with rate 70; no evidence of acute ischemia   Labs on Admission: I have personally reviewed the available labs and imaging studies at the time of  the admission.  Pertinent labs:   Glucose 146 BUN 19/Creatinine 1.41/GFR 50 Bili 1.6 BNP 271.6 HS troponin 22, 22 WBC 15.3 Hgb 11.9 Platelets 127 D-dimer 0.36   Assessment/Plan Principal Problem:   Acute on chronic diastolic (congestive) heart failure (HCC) Active Problems:   PAF (paroxysmal atrial fibrillation) (HCC)   Hypertension    Sleep apnea   Chronic respiratory failure with hypoxia (HCC)   COPD (chronic obstructive pulmonary disease) (HCC)   Hypothyroidism   Controlled type 2 diabetes mellitus with stage 3 chronic kidney disease, without long-term current use of insulin (HCC)   Chronic kidney disease, stage 3b   Abdominal pain   Acute on chronic diastolic CHF -Patient with known h/o chronic diastolic CHF presenting with worsening SOB  -He was seen by Dr. Haroldine [name] yesterday (9/24) with concern for exacerbation and so was sent for admission -CXR without pulmonary edema -Improving BNP compared to prior -At this time, exacerbation appears to be mild if present -Per cardiology, suggest IV diuresis - will give Lasix 40 mg IV x 1 in addition to 60 mg IV given overnight and plan to resume home toresemide and spironolactone in the AM -CHF order set utilized'  Chronic respiratory failure due to COPD with possible exacerbation -There were concerns about wheezing and possible COPD at the time of presentation -Following Duonebs and Solumedrol x 1, the patient reported to be at relative baseline at the time of my evaluation -Continue Albuterol, Breztri -Resume daily prednisone without increased steroid dosing at this time  Abdominal pain -Patient's primary complaint at the time of my evaluation was impressive abdominal TTP primarily in the RLQ but also diffusely -As such, abd CT was ordered and was unremarkable -Question whether this was related to opiate withdrawal, as the patient was having flare of his chronic pain as well since he had not received home meds at the time of my evaluation -Will continue to follow  Chronic pain  -I have reviewed this patient in the [place] Controlled Substances Reporting System.  He is receiving medications from only one provider and appears to be taking them as prescribed. -He is at high risk of opioid misuse, diversion, or overdose. -Continue home meds - Ultracet and Norco - watch for  excessive Tylenol dosing -Continue Neurontin, requip  Afib -Rate controlled with bisoprolol and Tikosyn; there was some concern about continuing this medication given his prolonged QTc but he is ventricularly paced and this was cleared with cardiology so will continue -Continue Eliquis  HTN -Continue Norvasc and Bisoprolol  HLD -Continue Crestor  DM -Glucose 136 -Diet controlled -Last A1c was 6.6 -There is no indication to start medication at this time, but will cover with SSI for now  Hypothyroidsm -Check TSH -Continue Synthroid at current dose for now  OSA -Will order CPAP  Stage 3b CKD -Recently appears to have improved to stage 3a CKD and stable there today -Will follow    Note: This patient has been tested and is negative for the novel coronavirus COVID-19.    DVT prophylaxis: Eliquis Code Status:  Full - confirmed with patient Family Communication: None present Disposition Plan:  The patient is from: home  Anticipated d/c is to: home, possibly with [facility] services  Anticipated d/c date will depend on clinical response to treatment, likely 2-4 days  Patient is currently: acutely ill Consults called: Cardiology; [facility] team Admission status: Admit - It is my clinical opinion that admission to INPATIENT is reasonable and necessary because this patient will require at least  2 midnights in the hospital to treat this condition based on the medical complexity of the problems presented.  Given the aforementioned information, the predictability of an adverse outcome is felt to be significant.     Karmen Bongo MD [facility]   How to contact the [facility] Attending or Consulting provider [place] or covering provider during after hours [place], for this patient?  1. Check the care team in [facility] and look for a) attending/consulting TRH provider listed and b) the [facility] team listed 2. Log into [email or web address] and use [place]'s universal password to access. If you do not have  the password, please contact the hospital operator. 3. Locate the [facility] provider you are looking for under [facility] and page to a number that you can be directly reached. 4. If you still have difficulty reaching the provider, please page the [facility] (Director on Call) for the Hospitalists listed on amion for assistance.   01/22/2020, 4:34 PM

## 2020-01-22 NOTE — Progress Notes (Signed)
Patient stated he has had 2 or 3 sleep studies that have not showed that he needed a CPAP machine.  Refused CPAP.  Does not use at home.  No distress noted at this time.

## 2020-01-22 NOTE — ED Notes (Signed)
Pharmacy notified to verify morphine due to patient having severe leg pain.

## 2020-01-22 NOTE — ED Notes (Signed)
Attempted to call report but RN unable to take report.

## 2020-01-23 DIAGNOSIS — E039 Hypothyroidism, unspecified: Secondary | ICD-10-CM

## 2020-01-23 DIAGNOSIS — J441 Chronic obstructive pulmonary disease with (acute) exacerbation: Secondary | ICD-10-CM

## 2020-01-23 DIAGNOSIS — N1832 Chronic kidney disease, stage 3b: Secondary | ICD-10-CM

## 2020-01-23 DIAGNOSIS — J9611 Chronic respiratory failure with hypoxia: Secondary | ICD-10-CM

## 2020-01-23 LAB — CBC
HCT: 35.5 % — ABNORMAL LOW (ref 39.0–52.0)
Hemoglobin: 11.4 g/dL — ABNORMAL LOW (ref 13.0–17.0)
MCH: 30 pg (ref 26.0–34.0)
MCHC: 32.1 g/dL (ref 30.0–36.0)
MCV: 93.4 fL (ref 80.0–100.0)
Platelets: 143 10*3/uL — ABNORMAL LOW (ref 150–400)
RBC: 3.8 MIL/uL — ABNORMAL LOW (ref 4.22–5.81)
RDW: 14.2 % (ref 11.5–15.5)
WBC: 16.3 10*3/uL — ABNORMAL HIGH (ref 4.0–10.5)
nRBC: 0 % (ref 0.0–0.2)

## 2020-01-23 LAB — BASIC METABOLIC PANEL
Anion gap: 14 (ref 5–15)
BUN: 32 mg/dL — ABNORMAL HIGH (ref 8–23)
CO2: 26 mmol/L (ref 22–32)
Calcium: 9.5 mg/dL (ref 8.9–10.3)
Chloride: 96 mmol/L — ABNORMAL LOW (ref 98–111)
Creatinine, Ser: 1.56 mg/dL — ABNORMAL HIGH (ref 0.61–1.24)
GFR calc Af Amer: 51 mL/min — ABNORMAL LOW (ref >60)
GFR calc non Af Amer: 44 mL/min — ABNORMAL LOW (ref >60)
Glucose, Bld: 166 mg/dL — ABNORMAL HIGH (ref 70–99)
Potassium: 4.5 mmol/L (ref 3.5–5.1)
Sodium: 136 mmol/L (ref 135–145)

## 2020-01-23 LAB — GLUCOSE, CAPILLARY: Glucose-Capillary: 208 mg/dL — ABNORMAL HIGH (ref 70–99)

## 2020-01-23 LAB — MAGNESIUM: Magnesium: 2.4 mg/dL (ref 1.7–2.4)

## 2020-01-23 MED ORDER — FEXOFENADINE HCL 180 MG PO TABS: 180.0000 mg | ORAL_TABLET | Freq: Every day | ORAL | Status: DC | PRN

## 2020-01-23 MED ORDER — POLYETHYLENE GLYCOL 3350 17 GM/SCOOP PO POWD: 17.0000 g | Freq: Every day | ORAL | Status: DC | PRN

## 2020-01-23 MED ORDER — ROPINIROLE HCL 1 MG PO TABS: 1.0000 mg | ORAL_TABLET | Freq: Two times a day (BID) | ORAL | Status: AC

## 2020-01-23 NOTE — Discharge Instructions (Signed)
Heart Failure Action Plan A heart failure action plan helps you understand what to do when you have symptoms of heart failure. Follow the plan that was created by you and your health care provider. Review your plan each time you visit your health care provider. Red zone These signs and symptoms mean you should get medical help right away:  You have trouble breathing when resting.  You have a dry cough that is getting worse.  You have swelling or pain in your legs or abdomen that is getting worse.  You suddenly gain more than 2-3 lb (0.9-1.4 kg) in a day, or more than 5 lb (2.3 kg) in one week. This amount may be more or less depending on your condition.  You have trouble staying awake or you feel confused.  You have chest pain.  You do not have an appetite.  You pass out. If you experience any of these symptoms:  Call your local emergency services (911 in the [place] right away or seek help at the emergency department of the nearest hospital. Yellow zone These signs and symptoms mean your condition may be getting worse and you should make some changes:  You have trouble breathing when you are active or you need to sleep with extra pillows.  You have swelling in your legs or abdomen.  You gain 2-3 lb (0.9-1.4 kg) in one day, or 5 lb (2.3 kg) in one week. This amount may be more or less depending on your condition.  You get tired easily.  You have trouble sleeping.  You have a dry cough. If you experience any of these symptoms:  Contact your health care provider within the next day.  Your health care provider may adjust your medicines. Green zone These signs mean you are doing well and can continue what you are doing:  You do not have shortness of breath.  You have very little swelling or no new swelling.  Your weight is stable (no gain or loss).  You have a normal activity level.  You do not have chest pain or any other new symptoms. Follow these instructions at  home:  Take over-the-counter and prescription medicines only as told by your health care provider.  Weigh yourself daily. Your target weight is __________ lb (__________ kg). ? Call your health care provider if you gain more than __________ lb (__________ kg) in a day, or more than __________ lb (__________ kg) in one week.  Eat a heart-healthy diet. Work with a diet and nutrition specialist (dietitian) to create an eating plan that is best for you.  Keep all follow-up visits as told by your health care provider. This is important. Where to find more information  [employer]: www.heart.org Summary  Follow the action plan that was created by you and your health care provider.  Get help right away if you have any symptoms in the Red zone. This information is not intended to replace advice given to you by your health care provider. Make sure you discuss any questions you have with your health care provider. Document Revised: 03/28/2017 Document Reviewed: 05/25/2016 [employer] Patient Education  2020 [employer]  

## 2020-01-23 NOTE — Discharge Summary (Addendum)
Physician Discharge Summary  TIMOHTY RENBARGER MCN:[id number] DOB: 1950/06/21 DOA: 01/21/2020  PCP: Velna Hatchet, MD  Admit date: 01/21/2020 Discharge date: 01/23/2020  Admitted From: Home Disposition: Home  Recommendations for Outpatient Follow-up:  1. Follow up with PCP in 1 week with repeat CBC/BMP 2. Outpatient follow-up with heart failure team/Dr. Bensimhon 3. Follow up in ED if symptoms worsen or new appear   Home Health: No Equipment/Devices: Continue supplemental oxygen via nasal cannula 2 L/min which patient uses at home.  Discharge Condition: Stable CODE STATUS: Full Diet recommendation: Heart healthy/carb modified/fluid restriction of up to 1500 cc a day.  Brief/Interim Summary: 69 year old male with history of RLS, OSA, hypothyroidism, hypertension, hyperlipidemia, COPD, chronic hypoxic respiratory failure on 2 L of oxygen at home, diabetes mellitus type 2, paroxysmal A. fib status post AF ablation/maze/pacemaker placement on Eliquis, chronic diastolic CHF presented with worsening shortness of breath on 01/22/2020.  He was seen at Dr. Clayborne Dana office on 01/21/2020 who recommended ED evaluation and possible admission.  On presentation, chest x-ray showed no acute cardiopulmonary disease.  COVID-19 test was negative.  CT of the abdomen and pelvis was done because of complaints of abdominal pain which showed no acute abdominal abnormality.  During the hospitalization, he received a few doses of intravenous Lasix on presentation.  Subsequently, his condition has improved.  His respiratory status has remained stable.  He has remained afebrile.  Patient feels much better and wants to go home today.  He will be discharged home with outpatient follow-up with PCP and heart failure team.  Discharge Diagnoses:   Acute on chronic diastolic CHF - He was seen at Dr. Clayborne Dana office on 01/21/2020 who recommended ED evaluation and possible admission. -Chest x-ray on presentation was without  any pulmonary edema -Patient was given IV Lasix on presentation.    He has diuresed well.   -Currently on outpatient regimen of oral torsemide/spironolactone/bisoprolol as per heart failure team's recommendations.   -Continue fluid restriction and heart healthy/2 g sodium diet -Patient feels much better and wants to go home today.  I have informed Dr. Haroldine Laws via secure chat and he is okay with this as well.  Patient will need to follow-up with him as an outpatient.  Continue current regimen on discharge.  COPD with mild exacerbation Chronic hypoxic respiratory failure -Wears 2 L oxygen via nasal cannula at home.  Currently on 2 L oxygen -Continue daily prednisone 5 mg daily.  Continue inhalers.  Outpatient follow-up with PCP/pulmonary  Leukocytosis -Questionable cause.  Worsening.  Currently being monitored off antibiotics.  Afebrile.  No signs of infection currently. -Outpatient follow-up of CBC.  Abdominal pain -Questionable cause.  CT of the abdomen was negative for acute abnormality -Abdominal pain has improved.  Chronic pain -Continue home regimen.  Outpatient follow-up with PCP.  Paroxysmal A. fib status post AF ablation/maze/pacemaker placement -Continue bisoprolol and Tikosyn.  Continue Eliquis.    Outpatient follow-up with cardiology/EP.  Hypertension -Continue Norvasc and bisoprolol and torsemide/spironolactone  Hyperlipidemia -Continue Crestor  Diabetes mellitus type 2 uncontrolled with hyperglycemia -Diet controlled.  Last A1c was 6.6.  Continue carb modified diet.  Hypothyroidism -Continue Synthroid -TSH 0.044 on presentation.  Recommend outpatient follow-up of TSH/T3/T4 in few weeks by PCP.  OSA -Outpatient follow-up.  Chronic kidney disease stage IIIb -Creatinine stable.    Outpatient follow-up.  Thrombocytopenia -Platelets stable.  No signs of bleeding  Discharge Instructions  Discharge Instructions    AMB referral to CHF clinic   Complete  by: As directed  Hospital follow-up   Diet - low sodium heart healthy   Complete by: As directed    Increase activity slowly   Complete by: As directed      Allergies as of 01/23/2020      Reactions   Meloxicam Rash   Vancomycin Other (See Comments)   Red Man's syndrome 09/02/15, resolved with diphenhydramine and slowing of rate      Medication List    TAKE these medications   albuterol 108 (90 Base) MCG/ACT inhaler Commonly known as: VENTOLIN HFA Inhale 2 puffs into the lungs every 6 (six) hours as needed for wheezing or shortness of breath.   amLODipine 10 MG tablet Commonly known as: NORVASC Take 0.5 tablets (5 mg total) by mouth daily.   apixaban 5 MG Tabs tablet Commonly known as: Eliquis TAKE 1 TABLET(5 MG) BY MOUTH TWICE DAILY What changed:   how much to take  how to take this  when to take this   bisoprolol 5 MG tablet Commonly known as: ZEBETA Take 5 mg by mouth daily.   Breztri Aerosphere 160-9-4.8 MCG/ACT Aero Generic drug: Budeson-Glycopyrrol-Formoterol Inhale 2 puffs into the lungs 2 (two) times daily.   dofetilide 250 MCG capsule Commonly known as: TIKOSYN Take 1 capsule (250 mcg total) by mouth 2 (two) times daily.   Ensure Max Protein Liqd Take 330 mLs (11 oz total) by mouth 2 (two) times daily.   fexofenadine 180 MG tablet Commonly known as: ALLEGRA Take 1 tablet (180 mg total) by mouth daily as needed for allergies or rhinitis.   gabapentin 300 MG capsule Commonly known as: NEURONTIN Take 600-1,200 mg by mouth See admin instructions. Take 2 capsules ( 600 mg) by mouth in the morning and  at lunchtime, and take 4 capsules (1,200 mg) at bedtime   HYDROcodone-acetaminophen 5-325 MG tablet Commonly known as: NORCO/VICODIN Take 1 tablet by mouth 2 (two) times daily as needed (pain).   levothyroxine 175 MCG tablet Commonly known as: SYNTHROID Take 175 mcg by mouth daily before breakfast.   MAGNESIUM PO Take 400 mcg by mouth at bedtime.  Has zinc/calcium   multivitamin with minerals Tabs tablet Take 1 tablet by mouth daily.   ondansetron 4 MG disintegrating tablet Commonly known as: Zofran ODT Take 1 tablet (4 mg total) by mouth every 8 (eight) hours as needed.   OXYGEN Inhale 2 L into the lungs continuous.   pantoprazole 40 MG tablet Commonly known as: PROTONIX Take 40 mg by mouth daily.   polyethylene glycol powder 17 GM/SCOOP powder Commonly known as: GLYCOLAX/MIRALAX Take 17 g by mouth daily as needed for mild constipation.   potassium chloride SA 20 MEQ tablet Commonly known as: KLOR-CON Take 1 tablet (20 mEq total) by mouth 2 (two) times daily. Needs appt for further refills   predniSONE 5 MG tablet Commonly known as: DELTASONE Take 1 tablet (5 mg total) by mouth daily with breakfast.   rOPINIRole 1 MG tablet Commonly known as: REQUIP Take 1 tablet (1 mg total) by mouth in the morning and at bedtime.   rosuvastatin 10 MG tablet Commonly known as: CRESTOR Take 10 mg by mouth at bedtime.   spironolactone 25 MG tablet Commonly known as: ALDACTONE Take 1 tablet (25 mg total) by mouth daily.   torsemide 20 MG tablet Commonly known as: DEMADEX Take 1 tablet (20 mg total) by mouth every other day.   traMADol-acetaminophen 37.5-325 MG tablet Commonly known as: ULTRACET Take 2 tablets by mouth 2 (two) times daily.  traZODone 50 MG tablet Commonly known as: DESYREL Take 12.5 mg by mouth at bedtime.   Vitamin D 50 MCG (2000 UT) Caps Take 8,000 Units by mouth at bedtime.       Follow-up Information    Velna Hatchet, MD. Schedule an appointment as soon as possible for a visit in 1 week(s).   Specialty: Internal Medicine Why: Repeat CBC/BMP Contact information: [place] [address] [phone]        Bensimhon, Shaune Pascal, MD. Schedule an appointment as soon as possible for a visit in 1 week(s).   Specialty: Cardiology Contact information: [address] [place] [address] [phone]        Thompson Grayer, MD .   Specialty: Cardiology Contact information: [address] [place] [address] [phone]              Allergies  Allergen Reactions  . Meloxicam Rash  . 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 syndrome 09/02/15, resolved with diphenhydramine and slowing of rate    Consultations:  Communicated via secure chat with Dr. Leilani Merl failure team   Procedures/Studies: DG Chest 2 View  Result Date: 01/22/2020 CLINICAL DATA:  Shortness of breath for a couple of months. EXAM: CHEST - 2 VIEW COMPARISON:  12/12/2019 FINDINGS: Cardiac pacemaker. Postoperative changes in the mediastinum. Borderline heart size with normal pulmonary vascularity. No edema or consolidation. No pleural effusions. No pneumothorax. Mediastinal contours appear intact. Calcification of the aorta. Similar appearance to previous study. IMPRESSION: No active cardiopulmonary disease. Electronically Signed   By: Lucienne Capers M.D.   On: 01/22/2020 00:56   DG Chest 2 View  Result Date: 01/22/2020 CLINICAL DATA:  Patient visited primary care physician due to shortness of breath and was sent in for possible heart failure. EXAM: CHEST - 2 VIEW COMPARISON:  01/21/2020 FINDINGS: Postoperative changes in the mediastinum. Cardiac pacemaker. Normal heart size and pulmonary vascularity. Lungs are clear. No consolidation or edema. No pleural effusions. No pneumothorax. Mediastinal contours appear intact. Calcification of the aorta. Degenerative changes in the spine. IMPRESSION: No active cardiopulmonary disease. Electronically Signed   By: Lucienne Capers M.D.   On: 01/22/2020 00:55   CT ABDOMEN PELVIS W CONTRAST  Result Date: 01/22/2020 CLINICAL DATA:  69 year old male with acute abdominal and pelvic pain with nausea today. EXAM: CT ABDOMEN AND PELVIS WITH CONTRAST TECHNIQUE: Multidetector CT imaging of the abdomen and pelvis was  performed using the standard protocol following bolus administration of intravenous contrast. CONTRAST:  24mL OMNIPAQUE IOHEXOL 300 MG/ML  SOLN COMPARISON:  12/12/2019 CT and prior studies FINDINGS: Lower chest: No acute abnormality. Cardiomegaly and pacemaker leads noted. Hepatobiliary: Hepatic steatosis and cholelithiasis noted. No biliary dilatation. Pancreas: Unremarkable Spleen: Splenomegaly noted with a splenic volume of 575 cc. No focal splenic lesions identified. Adrenals/Urinary Tract: RIGHT renal atrophy and scarring noted. No significant LEFT renal abnormalities are present. There is no evidence of hydronephrosis or urinary calculi. Stomach/Bowel: Stomach is within normal limits. Appendix appears normal. No evidence of bowel wall thickening, distention, or inflammatory changes. Vascular/Lymphatic: Aortic atherosclerosis. No enlarged abdominal or pelvic lymph nodes. Reproductive: Prostate is unremarkable. Other: Small to moderate umbilical hernia containing fat and moderate LEFT inguinal hernia containing fat again noted. No ascites, abscess or pneumoperitoneum identified. Musculoskeletal: No acute or suspicious bony abnormalities identified. IMPRESSION: 1. No evidence of acute abnormality. 2. Hepatic steatosis and cholelithiasis. 3. Splenomegaly. 4. Small to moderate umbilical hernia containing fat and moderate  LEFT inguinal hernia containing fat. 5. Cardiomegaly. 6. Aortic Atherosclerosis (ICD10-I70.0). Electronically Signed   By: Margarette Canada M.D.   On: 01/22/2020 10:34   CUP PACEART INCLINIC DEVICE CHECK  Result Date: 01/10/2020 Pacemaker check in clinic. Normal device function. Thresholds, sensing, impedances consistent with previous measurements. Device programmed to maximize longevity. Known PAF, on tikosyn and [place]. Burden ~2.0%. No high ventricular rates noted. Device programmed at appropriate safety margins. Histogram distribution appropriate for patient activity level.  Estimated longevity 7  mo. Patient enrolled in remote follow-up. Monthly remotes set up. RTC 6 months to discuss gen change.      Subjective: Patient seen and examined at bedside. Feels much better. No overnight fever, vomiting, worsening shortness of breath.  Also vomited.  Discharge Exam: Vitals:   01/23/20 0754 01/23/20 0811  BP:    Pulse:  95  Resp:  19  Temp: 98.4 F (36.9 C)   SpO2:  95%    General: Pt is alert, awake, not in acute distress.  Looks chronically ill.  Currently on 2 L oxygen via nasal cannula. Cardiovascular: rate controlled, S1/S2 + Respiratory: bilateral decreased breath sounds at bases with some scattered crackles abdominal: Soft, NT, ND, bowel sounds + Extremities: Trace lower extremity edema; no cyanosis    The results of significant diagnostics from this hospitalization (including imaging, microbiology, ancillary and laboratory) are listed below for reference.     Microbiology: Recent Results (from the past 240 hour(s))  Respiratory Panel by RT PCR (Flu A&B, Covid) - Nasopharyngeal Swab     Status: None   Collection Time: 01/22/20  2:10 AM   Specimen: Nasopharyngeal Swab  Result Value Ref Range Status   SARS Coronavirus 2 by RT PCR NEGATIVE NEGATIVE Final    Comment: (NOTE) SARS-CoV-2 target nucleic acids are NOT DETECTED.  The SARS-CoV-2 RNA is generally detectable in upper respiratoy specimens during the acute phase of infection. The lowest concentration of SARS-CoV-2 viral copies this assay can detect is 131 copies/mL. A negative result does not preclude SARS-Cov-2 infection and should not be used as the sole basis for treatment or other patient management decisions. A negative result may occur with  improper specimen collection/handling, submission of specimen other than nasopharyngeal swab, presence of viral mutation(s) within the areas targeted by this assay, and inadequate number of viral copies (<131 copies/mL). A negative result must be combined with  clinical observations, patient history, and epidemiological information. The expected result is Negative.  Fact Sheet for Patients:  PinkCheek.be  Fact Sheet for Healthcare Providers:  GravelBags.it  This test is no t yet approved or cleared by the [place] FDA and  has been authorized for detection and/or diagnosis of SARS-CoV-2 by FDA under an Emergency Use Authorization (EUA). This EUA will remain  in effect (meaning this test can be used) for the duration of the COVID-19 declaration under Section 564(b)(1) of the Act, 21 U.S.C. section 360bbb-3(b)(1), unless the authorization is terminated or revoked sooner.     Influenza A by PCR NEGATIVE NEGATIVE Final   Influenza B by PCR NEGATIVE NEGATIVE Final    Comment: (NOTE) The Xpert Xpress SARS-CoV-2/FLU/RSV assay is intended as an aid in  the diagnosis of influenza from Nasopharyngeal swab specimens and  should not be used as a sole basis for treatment. Nasal washings and  aspirates are unacceptable for Xpert Xpress SARS-CoV-2/FLU/RSV  testing.  Fact Sheet for Patients: PinkCheek.be  Fact Sheet for Healthcare Providers: GravelBags.it  This test is not yet approved or  cleared by the [place] and  has been authorized for detection and/or diagnosis of SARS-CoV-2 by  FDA under an Emergency Use Authorization (EUA). This EUA will remain  in effect (meaning this test can be used) for the duration of the  Covid-19 declaration under Section 564(b)(1) of the Act, 21  U.S.C. section 360bbb-3(b)(1), unless the authorization is  terminated or revoked. Performed at [facility], [place] [address], [place], [place] [address]   Blood culture (routine x 2)     Status: None (Preliminary result)   Collection Time: 01/22/20  6:03 AM   Specimen: BLOOD RIGHT FOREARM  Result Value Ref Range Status   Specimen  Description BLOOD RIGHT FOREARM  Final   Special Requests   Final    BOTTLES DRAWN AEROBIC AND ANAEROBIC Blood Culture adequate volume   Culture   Final    NO GROWTH 1 DAY Performed at [facility], [place] [address], [place], [place] [address]    Report Status PENDING  Incomplete  Blood culture (routine x 2)     Status: None (Preliminary result)   Collection Time: 01/22/20  6:11 AM   Specimen: BLOOD LEFT FOREARM  Result Value Ref Range Status   Specimen Description BLOOD LEFT FOREARM  Final   Special Requests   Final    BOTTLES DRAWN AEROBIC AND ANAEROBIC Blood Culture adequate volume   Culture   Final    NO GROWTH 1 DAY Performed at [facility], [place] [address], [place], [place] [address]    Report Status PENDING  Incomplete     Labs: BNP (last 3 results) Recent Labs    12/13/19 0120 01/21/20 1616 01/22/20 0054  BNP 611.6* 334.3* 295.2*   Basic Metabolic Panel: Recent Labs  Lab 01/21/20 1616 01/21/20 1811 01/23/20 0430  NA 136 135 136  K 4.3 4.6 4.5  CL 99 97* 96*  CO2 27 26 26   GLUCOSE 141* 146* 166*  BUN 20 19 32*  CREATININE 1.43* 1.41* 1.56*  CALCIUM 9.4 9.4 9.5  MG 2.0  --  2.4   Liver Function Tests: Recent Labs  Lab 01/22/20 0054  AST 19  ALT 32  ALKPHOS 50  BILITOT 1.6*  PROT 7.1  ALBUMIN 3.8   No results for input(s): LIPASE, AMYLASE in the last 168 hours. No results for input(s): AMMONIA in the last 168 hours. CBC: Recent Labs  Lab 01/21/20 1616 01/21/20 1811 01/23/20 0430  WBC 14.9* 15.3* 16.3*  HGB 12.2* 11.9* 11.4*  HCT 37.9* 37.4* 35.5*  MCV 96.2 97.7 93.4  PLT 131* 127* 143*   Cardiac Enzymes: No results for input(s): CKTOTAL, CKMB, CKMBINDEX, TROPONINI in the last 168 hours. BNP: Invalid input(s): POCBNP CBG: Recent Labs  Lab 01/22/20 1656 01/22/20 2043 01/23/20 0752  GLUCAP 333* 186* 208*   D-Dimer Recent Labs    01/22/20 0054  DDIMER 0.36   Hgb A1c No results for input(s): HGBA1C in the last 72  hours. Lipid Profile No results for input(s): CHOL, HDL, LDLCALC, TRIG, CHOLHDL, LDLDIRECT in the last 72 hours. Thyroid function studies Recent Labs    01/22/20 1726  TSH 0.044*   Anemia work up No results for input(s): VITAMINB12, FOLATE, FERRITIN, TIBC, IRON, RETICCTPCT in the last 72 hours. Urinalysis    Component Value Date/Time   COLORURINE YELLOW 01/22/2020 [place] 01/22/2020 0152   LABSPEC 1.015 01/22/2020 0152   PHURINE 6.0 01/22/2020 [place] 01/22/2020 0152  HGBUR NEGATIVE 01/22/2020 [place] 01/22/2020 0152   KETONESUR NEGATIVE 01/22/2020 0152   PROTEINUR 30 (A) 01/22/2020 0152   NITRITE NEGATIVE 01/22/2020 0152   LEUKOCYTESUR NEGATIVE 01/22/2020 0152   Sepsis Labs Invalid input(s): PROCALCITONIN,  WBC,  LACTICIDVEN Microbiology Recent Results (from the past 240 hour(s))  Respiratory Panel by RT PCR (Flu A&B, Covid) - Nasopharyngeal Swab     Status: None   Collection Time: 01/22/20  2:10 AM   Specimen: Nasopharyngeal Swab  Result Value Ref Range Status   SARS Coronavirus 2 by RT PCR NEGATIVE NEGATIVE Final    Comment: (NOTE) SARS-CoV-2 target nucleic acids are NOT DETECTED.  The SARS-CoV-2 RNA is generally detectable in upper respiratoy specimens during the acute phase of infection. The lowest concentration of SARS-CoV-2 viral copies this assay can detect is 131 copies/mL. A negative result does not preclude SARS-Cov-2 infection and should not be used as the sole basis for treatment or other patient management decisions. A negative result may occur with  improper specimen collection/handling, submission of specimen other than nasopharyngeal swab, presence of viral mutation(s) within the areas targeted by this assay, and inadequate number of viral copies (<131 copies/mL). A negative result must be combined with clinical observations, patient history, and epidemiological information. The expected result  is Negative.  Fact Sheet for Patients:  PinkCheek.be  Fact Sheet for Healthcare Providers:  GravelBags.it  This test is no t yet approved or cleared by the [place] FDA and  has been authorized for detection and/or diagnosis of SARS-CoV-2 by FDA under an Emergency Use Authorization (EUA). This EUA will remain  in effect (meaning this test can be used) for the duration of the COVID-19 declaration under Section 564(b)(1) of the Act, 21 U.S.C. section 360bbb-3(b)(1), unless the authorization is terminated or revoked sooner.     Influenza A by PCR NEGATIVE NEGATIVE Final   Influenza B by PCR NEGATIVE NEGATIVE Final    Comment: (NOTE) The Xpert Xpress SARS-CoV-2/FLU/RSV assay is intended as an aid in  the diagnosis of influenza from Nasopharyngeal swab specimens and  should not be used as a sole basis for treatment. Nasal washings and  aspirates are unacceptable for Xpert Xpress SARS-CoV-2/FLU/RSV  testing.  Fact Sheet for Patients: PinkCheek.be  Fact Sheet for Healthcare Providers: GravelBags.it  This test is not yet approved or cleared by the [place] FDA and  has been authorized for detection and/or diagnosis of SARS-CoV-2 by  FDA under an Emergency Use Authorization (EUA). This EUA will remain  in effect (meaning this test can be used) for the duration of the  Covid-19 declaration under Section 564(b)(1) of the Act, 21  U.S.C. section 360bbb-3(b)(1), unless the authorization is  terminated or revoked. Performed at [facility], [place] [address] [place], [place] [address]   Blood culture (routine x 2)     Status: None (Preliminary result)   Collection Time: 01/22/20  6:03 AM   Specimen: BLOOD RIGHT FOREARM  Result Value Ref Range Status   Specimen Description BLOOD RIGHT FOREARM  Final   Special Requests   Final    BOTTLES DRAWN AEROBIC AND  ANAEROBIC Blood Culture adequate volume   Culture   Final    NO GROWTH 1 DAY Performed at [facility], [place] [address], [place], [place] [address]    Report Status PENDING  Incomplete  Blood culture (routine x 2)     Status: None (Preliminary result)   Collection Time: 01/22/20  6:11 AM   Specimen: BLOOD LEFT FOREARM  Result Value Ref Range Status   Specimen Description BLOOD LEFT FOREARM  Final   Special Requests   Final    BOTTLES DRAWN AEROBIC AND ANAEROBIC Blood Culture adequate volume   Culture   Final    NO GROWTH 1 DAY Performed at [facility], [place] [address] [place], [place] [address]    Report Status PENDING  Incomplete     Time coordinating discharge: 35 minutes  SIGNED:   Aline August, MD  [facility] 01/23/2020, 10:17 AM

## 2020-01-23 NOTE — Progress Notes (Signed)
  ReDS Clip       Changes to prescribed diuretics recommended:  Resume oral diuretics      REDS Clip  READING=  Elevated at 41% on admission > 32% (goal < 35)  CHEST RULER = 35 Clip Station = D   Orthodema score =  Signs/Symptoms Score   Mild edema, no orthopnea 0 No congestion  Moderate edema, no orthopnea 1 Low-grade orthodema/congestion  Severe edema OR orthopnea 2   Moderate edema and orthopnea 3 High-grade orthodema/congestion  Severe edema AND orthopnea 4     Bonnita Nasuti Pharm.D. CPP, BCPS Clinical Pharmacist [phone] 01/23/2020 9:33 AM

## 2020-01-26 ENCOUNTER — Telehealth (HOSPITAL_COMMUNITY): Payer: Self-pay | Admitting: *Deleted

## 2020-01-26 MED ORDER — DOXYCYCLINE MONOHYDRATE 100 MG PO TABS: 100.0000 mg | ORAL_TABLET | Freq: Two times a day (BID) | ORAL | 0 refills | Status: AC

## 2020-01-26 NOTE — Telephone Encounter (Signed)
Pt's wife called concerned about pt, she states he was d/c'd on Sunday but other than some diuresis nothing was done. She states pt's breathing is better but he still feels bad, with no energy, weakness, and chills.  Discussed w/Dr Bensimhon, he advises to put pt on Doxycycline 100 mg BID for 7 days, pt should f/u w/PCP  Pt's wife is aware and agreeable, rx sent in, pt is sch to see PCP on Mon 10/4

## 2020-01-27 LAB — CULTURE, BLOOD (ROUTINE X 2)
Culture: NO GROWTH
Culture: NO GROWTH
Special Requests: ADEQUATE
Special Requests: ADEQUATE

## 2020-02-07 ENCOUNTER — Other Ambulatory Visit (HOSPITAL_COMMUNITY): Payer: Self-pay | Admitting: Internal Medicine

## 2020-02-11 ENCOUNTER — Telehealth: Payer: Self-pay | Admitting: Emergency Medicine

## 2020-02-14 ENCOUNTER — Ambulatory Visit (INDEPENDENT_AMBULATORY_CARE_PROVIDER_SITE_OTHER): Payer: Medicare Other

## 2020-02-14 DIAGNOSIS — I440 Atrioventricular block, first degree: Secondary | ICD-10-CM

## 2020-02-14 NOTE — Telephone Encounter (Signed)
Spoke with [name]  She is asking about status of pt assistance application  I advised her to call [place] and me to check status and to call us back if there is something further needed

## 2020-02-15 ENCOUNTER — Telehealth: Payer: Self-pay

## 2020-02-15 LAB — CUP PACEART REMOTE DEVICE CHECK
Battery Remaining Longevity: 6 mo
Battery Remaining Percentage: 5 %
Battery Voltage: 2.66 V
Brady Statistic AP VP Percent: 44 %
Brady Statistic AP VS Percent: 1 %
Brady Statistic AS VP Percent: 55 %
Brady Statistic AS VS Percent: 1 %
Brady Statistic RA Percent Paced: 2.4 %
Brady Statistic RV Percent Paced: 99 %
Date Time Interrogation Session: 20211018023425
Implantable Lead Implant Date: 20131203
Implantable Lead Implant Date: 20131203
Implantable Lead Location: 753859
Implantable Lead Location: 753860
Implantable Lead Model: 1948
Implantable Pulse Generator Implant Date: 20131203
Lead Channel Impedance Value: 440 Ohm
Lead Channel Impedance Value: 660 Ohm
Lead Channel Pacing Threshold Amplitude: 0.625 V
Lead Channel Pacing Threshold Amplitude: 0.875 V
Lead Channel Pacing Threshold Pulse Width: 0.5 ms
Lead Channel Pacing Threshold Pulse Width: 0.8 ms
Lead Channel Sensing Intrinsic Amplitude: 10 mV
Lead Channel Sensing Intrinsic Amplitude: 4.1 mV
Lead Channel Setting Pacing Amplitude: 1.125
Lead Channel Setting Pacing Amplitude: 1.625
Lead Channel Setting Pacing Pulse Width: 0.8 ms
Lead Channel Setting Sensing Sensitivity: 4 mV
Pulse Gen Model: 2210
Pulse Gen Serial Number: 7408461

## 2020-02-15 NOTE — Telephone Encounter (Signed)
Patient's wife returned my call.  She is aware of appt 02/18/20 at 10:30 am with Roderic Palau, NP.

## 2020-02-15 NOTE — Telephone Encounter (Signed)
Called and left message for patient to call back so we can schedule appt with Roderic Palau, NP.

## 2020-02-15 NOTE — Telephone Encounter (Signed)
Merlin transmission received- Ongoing AF since 01/12/20.  Current AF burden 97%.  Pt has history of persistent AF occurring with COPD exacerbation, requiring DCCV to convert.  Pt hospitalized 9/24 for COPD exacerbation.  Current medications include Tikosyn 250 mcg BID, Bisoprolol 5 mg daily, Eliquis 5 mg BID.  Spoke with pt, he was not aware of being in AF, denies any current symptoms.  Pt confirmed compliance with medications as ordered.    Pt previously managed in AF clinic, agreeable with following up with them.

## 2020-02-17 NOTE — Progress Notes (Signed)
Remote pacemaker transmission.   

## 2020-02-18 ENCOUNTER — Other Ambulatory Visit: Payer: Self-pay

## 2020-02-18 ENCOUNTER — Encounter (HOSPITAL_COMMUNITY): Payer: Self-pay | Admitting: Nurse Practitioner

## 2020-02-18 ENCOUNTER — Ambulatory Visit (HOSPITAL_COMMUNITY)
Admission: RE | Admit: 2020-02-18 | Discharge: 2020-02-18 | Disposition: A | Discharge status: Home / Self Care | Payer: Medicare Other | Source: Ambulatory Visit | Attending: Nurse Practitioner | Admitting: Nurse Practitioner

## 2020-02-18 VITALS — BP 130/68 | HR 70 | Ht 71.0 in | Wt 197.8 lb

## 2020-02-18 DIAGNOSIS — Z79899 Other long term (current) drug therapy: Secondary | ICD-10-CM | POA: Insufficient documentation

## 2020-02-18 DIAGNOSIS — I4891 Unspecified atrial fibrillation: Secondary | ICD-10-CM | POA: Diagnosis present

## 2020-02-18 DIAGNOSIS — J449 Chronic obstructive pulmonary disease, unspecified: Secondary | ICD-10-CM | POA: Diagnosis not present

## 2020-02-18 DIAGNOSIS — D6869 Other thrombophilia: Secondary | ICD-10-CM | POA: Diagnosis not present

## 2020-02-18 DIAGNOSIS — Z7901 Long term (current) use of anticoagulants: Secondary | ICD-10-CM | POA: Insufficient documentation

## 2020-02-18 DIAGNOSIS — I509 Heart failure, unspecified: Secondary | ICD-10-CM | POA: Diagnosis not present

## 2020-02-18 DIAGNOSIS — E119 Type 2 diabetes mellitus without complications: Secondary | ICD-10-CM | POA: Diagnosis not present

## 2020-02-18 DIAGNOSIS — Z87891 Personal history of nicotine dependence: Secondary | ICD-10-CM | POA: Diagnosis not present

## 2020-02-18 DIAGNOSIS — I110 Hypertensive heart disease with heart failure: Secondary | ICD-10-CM | POA: Insufficient documentation

## 2020-02-18 DIAGNOSIS — Z888 Allergy status to other drugs, medicaments and biological substances status: Secondary | ICD-10-CM | POA: Insufficient documentation

## 2020-02-18 DIAGNOSIS — I4819 Other persistent atrial fibrillation: Secondary | ICD-10-CM | POA: Diagnosis not present

## 2020-02-18 DIAGNOSIS — Z8249 Family history of ischemic heart disease and other diseases of the circulatory system: Secondary | ICD-10-CM | POA: Diagnosis not present

## 2020-02-18 DIAGNOSIS — Z950 Presence of cardiac pacemaker: Secondary | ICD-10-CM | POA: Insufficient documentation

## 2020-02-18 DIAGNOSIS — Z9981 Dependence on supplemental oxygen: Secondary | ICD-10-CM | POA: Insufficient documentation

## 2020-02-18 LAB — BASIC METABOLIC PANEL
Anion gap: 12 (ref 5–15)
BUN: 32 mg/dL — ABNORMAL HIGH (ref 8–23)
CO2: 26 mmol/L (ref 22–32)
Calcium: 9.8 mg/dL (ref 8.9–10.3)
Chloride: 97 mmol/L — ABNORMAL LOW (ref 98–111)
Creatinine, Ser: 1.96 mg/dL — ABNORMAL HIGH (ref 0.61–1.24)
GFR, Estimated: 36 mL/min — ABNORMAL LOW (ref >60)
Glucose, Bld: 226 mg/dL — ABNORMAL HIGH (ref 70–99)
Potassium: 4.4 mmol/L (ref 3.5–5.1)
Sodium: 135 mmol/L (ref 135–145)

## 2020-02-18 LAB — CBC
HCT: 37.9 % — ABNORMAL LOW (ref 39.0–52.0)
Hemoglobin: 11.9 g/dL — ABNORMAL LOW (ref 13.0–17.0)
MCH: 30.5 pg (ref 26.0–34.0)
MCHC: 31.4 g/dL (ref 30.0–36.0)
MCV: 97.2 fL (ref 80.0–100.0)
Platelets: 157 10*3/uL (ref 150–400)
RBC: 3.9 MIL/uL — ABNORMAL LOW (ref 4.22–5.81)
RDW: 14.2 % (ref 11.5–15.5)
WBC: 7.7 10*3/uL (ref 4.0–10.5)
nRBC: 0 % (ref 0.0–0.2)

## 2020-02-18 LAB — MAGNESIUM: Magnesium: 2.1 mg/dL (ref 1.7–2.4)

## 2020-02-18 NOTE — Patient Instructions (Signed)
Cardioversion scheduled for Friday, October 29th  - Arrive at the [facility] and go to admitting at 930AM  - Do not eat or drink anything after midnight the night prior to your procedure.  - Take all your morning medication (except diabetic medications) with a sip of water prior to arrival.  - You will not be able to drive home after your procedure.  - Do NOT miss any doses of your blood thinner - if you should miss a dose please notify our office immediately.  - If you feel as if you go back into normal rhythm prior to scheduled cardioversion, please notify our office immediately. If your procedure is canceled in the cardioversion suite you will be charged a cancellation fee.

## 2020-02-18 NOTE — H&P (View-Only) (Signed)
Primary Care Physician: Velna Hatchet, MD Referring Physician  Dr. Rayann Heman  AHF: Dr. Virgina Jock is a 69 y.o. male with a h/o advanced COPD on home O2, with frequent flareup,A. fib on Tikosyn and systemic anticoagulation, IIDM. Patient presented to the ED on 6/19with increasing short of breath,wheezing, cough and poor oral intake for 3 days. Denies any fever chills.   Patient came to ED a day earlier for same acute issue and was discharged with p.o. steroid.His symptoms persisted and he was also not able to take his regular medications including Tikosyn. He returned back to ED on 6/19.   He had missed a couple of days of his Tikosyn as he felt nauseated and could not take his meds.He was in  afib.  EP saw pt in the hospital and restarted tikosyn He underwent successful cardioversion on 6/22. For his COPD he had po doxycycline in the hospital as well as IV steroids.   In the afib clinic , today, he is in [place] and feels improved. His breathing is also improved.  He is a sensed, v paced today. Being compliant with his meds.   F/u in afib clinic, 02/18/20. Per device clinic he has been in persistent  afib since 01/12/20. He was hospitalized 9/24 for COPD exacerbation. He continues on Tikosyn 250 mcg bid. He has not missed any  eliquis 5 mg bid for at the least 3 weeks. No Vaccines. CHA2DS2VASc score of at least 5. Wearing O2 via continuous [place].   Today, he denies symptoms of palpitations, chest pain, shortness of breath, orthopnea, PND, lower extremity edema, dizziness, presyncope, syncope, or neurologic sequela. The patient is tolerating medications without difficulties and is otherwise without complaint today.   Past Medical History:  Diagnosis Date  . Aortic stenosis, mild   . Arthritis of hand    "just a little bit in both hands" (03/31/2012)  . Asthma    "little bit" (03/31/2012)  . Atrial fibrillation [facility])    dx '04; DCCV '04, placed on flecainide, failed DCCV  04/2010, flecainide stopped; s/p successful A.fib ablation 01/31/12  . CHF (congestive heart failure) ([place])   . Controlled type 2 diabetes mellitus without complication, without long-term current use of insulin ([place]) 12/13/2019  . COPD (chronic obstructive pulmonary disease) ([place])   . DJD (degenerative joint disease)   . Fibromyalgia   . First degree AV block    post ablation, ~41ms  . GERD (gastroesophageal reflux disease)   . Hyperlipidemia   . Hypertension   . Hypothyroidism    S/P radiation  . Left atrial enlargement    LA size 52mm by echo 11/21/11  . Mitral regurgitation    trivial  . Obstructive sleep apnea    mild by sleep study 2013; pt stated he does not have a machine because "it wasn't bad enough for him to have one"  . Pacemaker 03/31/2012  . Restless leg syndrome   . Second degree AV block   . Splenomegaly   . Visit for monitoring Tikosyn therapy 04/16/2016   Past Surgical History:  Procedure Laterality Date  . AORTIC VALVE REPLACEMENT N/A 08/22/2015   Procedure: AORTIC VALVE REPLACEMENT (AVR);  Surgeon: Ivin Poot, MD;  Location: [place];  Service: Open Heart Surgery;  Laterality: N/A;  . ATRIAL FIBRILLATION ABLATION  01/30/2012   PVI by Dr. Rayann Heman  . ATRIAL FIBRILLATION ABLATION N/A 01/31/2012   Procedure: ATRIAL FIBRILLATION ABLATION;  Surgeon: Thompson Grayer, MD;  Location: [facility]  CATH LAB;  Service: Cardiovascular;  Laterality: N/A;  . BACK SURGERY     X 3  . CARDIAC CATHETERIZATION N/A 08/09/2015   Procedure: Right/Left Heart Cath and Coronary Angiography;  Surgeon: Peter M Martinique, MD;  Location: [place] CV LAB;  Service: Cardiovascular;  Laterality: N/A;  . CARDIAC CATHETERIZATION N/A 02/05/2016   Procedure: Right/Left Heart Cath and Coronary Angiography;  Surgeon: Jettie Booze, MD;  Location: [place] CV LAB;  Service: Cardiovascular;  Laterality: N/A;  . CARDIAC CATHETERIZATION N/A 04/17/2016   Procedure: Right Heart Cath;  Surgeon: Jolaine Artist,  MD;  Location: [place] CV LAB;  Service: Cardiovascular;  Laterality: N/A;  . CARDIOVASCULAR STRESS TEST  03/12/2002   EF 48%, NO EVIDENCE OF ISCHEMIA  . CARDIOVERSION  01/2012; 03/31/2012  . CARDIOVERSION N/A 02/25/2012   Procedure: CARDIOVERSION;  Surgeon: Thompson Grayer, MD;  Location: [facility] CATH LAB;  Service: Cardiovascular;  Laterality: N/A;  . CARDIOVERSION N/A 03/31/2012   Procedure: CARDIOVERSION;  Surgeon: Thompson Grayer, MD;  Location: [facility] CATH LAB;  Service: Cardiovascular;  Laterality: N/A;  . CARDIOVERSION N/A 11/23/2015   Procedure: CARDIOVERSION;  Surgeon: Larey Dresser, MD;  Location: [place];  Service: Cardiovascular;  Laterality: N/A;  . CARDIOVERSION N/A 04/08/2016   Procedure: CARDIOVERSION;  Surgeon: Jolaine Artist, MD;  Location: [facility] ENDOSCOPY;  Service: Cardiovascular;  Laterality: N/A;  . CARDIOVERSION N/A 09/14/2018   Procedure: CARDIOVERSION;  Surgeon: Josue Hector, MD;  Location: [facility] ENDOSCOPY;  Service: Cardiovascular;  Laterality: N/A;  . CARDIOVERSION N/A 10/19/2019   Procedure: CARDIOVERSION;  Surgeon: Jolaine Artist, MD;  Location: [place];  Service: Cardiovascular;  Laterality: N/A;  . CLIPPING OF ATRIAL APPENDAGE N/A 08/22/2015   Procedure: CLIPPING OF ATRIAL APPENDAGE;  Surgeon: Ivin Poot, MD;  Location: [place];  Service: Open Heart Surgery;  Laterality: N/A;  . DOPPLER ECHOCARDIOGRAPHY  03/11/2002   EF 70-75%  . FINGER SURGERY Left    Middle finger  . FINGER TENDON REPAIR  1980's   "right little finger" (03/31/2012)  . INSERT / REPLACE / REMOVE PACEMAKER     [employer]  . IR RADIOLOGIST EVAL & MGMT  05/06/2019  . IR RADIOLOGIST EVAL & MGMT  10/06/2019  . LEFT AND RIGHT HEART CATHETERIZATION WITH CORONARY ANGIOGRAM N/A 10/27/2012   Procedure: LEFT AND RIGHT HEART CATHETERIZATION WITH CORONARY ANGIOGRAM;  Surgeon: Laverda Page, MD;  Location: [facility] CATH LAB;  Service: Cardiovascular;  Laterality: N/A;  . LUMBAR DISC SURGERY  1980's X2;  2000's   . MAZE N/A 08/22/2015   Procedure: MAZE;  Surgeon: Ivin Poot, MD;  Location: [place];  Service: Open Heart Surgery;  Laterality: N/A;  . PACEMAKER INSERTION  03/31/2012   STJ Accent DR pacemaker implanted by Dr Rayann Heman  . PERMANENT PACEMAKER INSERTION N/A 03/31/2012   Procedure: PERMANENT PACEMAKER INSERTION;  Surgeon: Thompson Grayer, MD;  Location: [facility] CATH LAB;  Service: Cardiovascular;  Laterality: N/A;  . TEE WITHOUT CARDIOVERSION  01/30/2012   Procedure: TRANSESOPHAGEAL ECHOCARDIOGRAM (TEE);  Surgeon: Larey Dresser, MD;  Location: [place];  Service: Cardiovascular;  Laterality: N/A;  ablation next day  . TEE WITHOUT CARDIOVERSION N/A 08/22/2015   Procedure: TRANSESOPHAGEAL ECHOCARDIOGRAM (TEE);  Surgeon: Ivin Poot, MD;  Location: [place];  Service: Open Heart Surgery;  Laterality: N/A;  . TEE WITHOUT CARDIOVERSION N/A 10/19/2019   Procedure: TRANSESOPHAGEAL ECHOCARDIOGRAM (TEE);  Surgeon: Jolaine Artist, MD;  Location: [place];  Service: Cardiovascular;  Laterality: N/A;  .  US ECHOCARDIOGRAPHY  08/07/2009   EF 55-60%    Current Outpatient Medications  Medication Sig Dispense Refill  . albuterol (PROVENTIL HFA;VENTOLIN HFA) 108 (90 Base) MCG/ACT inhaler Inhale 2 puffs into the lungs every 6 (six) hours as needed for wheezing or shortness of breath. 1 Inhaler 5  . amLODipine (NORVASC) 10 MG tablet Take 0.5 tablets (5 mg total) by mouth daily. 30 tablet 11  . apixaban (ELIQUIS) 5 MG TABS tablet TAKE 1 TABLET(5 MG) BY MOUTH TWICE DAILY (Patient taking differently: Take 5 mg by mouth 2 (two) times daily. ) 180 tablet 3  . bisoprolol (ZEBETA) 5 MG tablet TAKE 1 TABLET(5 MG) BY MOUTH DAILY 30 tablet 6  . Budeson-Glycopyrrol-Formoterol (BREZTRI AEROSPHERE) 160-9-4.8 MCG/ACT AERO Inhale 2 puffs into the lungs 2 (two) times daily. 4 g 0  . Cholecalciferol (VITAMIN D) 2000 UNITS CAPS Take 8,000 Units by mouth at bedtime.     . dofetilide (TIKOSYN) 250 MCG capsule Take 1 capsule (250  mcg total) by mouth 2 (two) times daily. 180 capsule 1  . Ensure Max Protein (ENSURE MAX PROTEIN) LIQD Take 330 mLs (11 oz total) by mouth 2 (two) times daily. 3300 mL 0  . fexofenadine (ALLEGRA) 180 MG tablet Take 1 tablet (180 mg total) by mouth daily as needed for allergies or rhinitis.    [name] gabapentin (NEURONTIN) 300 MG capsule Take 600-1,200 mg by mouth See admin instructions. Take 2 capsules ( 600 mg) by mouth in the morning and  at lunchtime, and take 4 capsules (1,200 mg) at bedtime  3  . HYDROcodone-acetaminophen (NORCO/VICODIN) 5-325 MG tablet Take 1 tablet by mouth 2 (two) times daily as needed (pain).     [name] levothyroxine (SYNTHROID, LEVOTHROID) 175 MCG tablet Take 175 mcg by mouth daily before breakfast.    . MAGNESIUM PO Take 400 mcg by mouth at bedtime. Has zinc/calcium    . Multiple Vitamin (MULTIVITAMIN WITH MINERALS) TABS tablet Take 1 tablet by mouth daily. 30 tablet 0  . ondansetron (ZOFRAN ODT) 4 MG disintegrating tablet Take 1 tablet (4 mg total) by mouth every 8 (eight) hours as needed. 20 tablet 0  . OXYGEN Inhale 2 L into the lungs continuous.    . pantoprazole (PROTONIX) 40 MG tablet Take 40 mg by mouth daily.    . polyethylene glycol powder (GLYCOLAX/MIRALAX) 17 GM/SCOOP powder Take 17 g by mouth daily as needed for mild constipation.    . potassium chloride SA (KLOR-CON) 20 MEQ tablet Take 1 tablet (20 mEq total) by mouth 2 (two) times daily. Needs appt for further refills 180 tablet 0  . predniSONE (DELTASONE) 5 MG tablet Take 1 tablet (5 mg total) by mouth daily with breakfast. 30 tablet 3  . rOPINIRole (REQUIP) 1 MG tablet Take 1 tablet (1 mg total) by mouth in the morning and at bedtime.    . rosuvastatin (CRESTOR) 10 MG tablet Take 10 mg by mouth at bedtime.     [name] spironolactone (ALDACTONE) 25 MG tablet Take 1 tablet (25 mg total) by mouth daily. 90 tablet 3  . torsemide (DEMADEX) 20 MG tablet Take 1 tablet (20 mg total) by mouth every other day. 45 tablet 3  .  traMADol-acetaminophen (ULTRACET) 37.5-325 MG tablet Take 2 tablets by mouth 2 (two) times daily.     No current facility-administered medications for this encounter.    Allergies  Allergen Reactions  . Meloxicam Rash  . 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 syndrome 09/02/15, resolved  with diphenhydramine and slowing of rate    Social History   Socioeconomic History  . Marital status: Married    Spouse name: Not on file  . Number of children: Not on file  . Years of education: Not on file  . Highest education level: Not on file  Occupational History  . Occupation: retired  Tobacco Use  . Smoking status: Former Smoker    Packs/day: 1.00    Years: 25.00    Pack years: 25.00    Types: Cigarettes    Quit date: 07/18/1991    Years since quitting: 28.6  . Smokeless tobacco: Never Used  Vaping Use  . Vaping Use: Never used  Substance and Sexual Activity  . Alcohol use: Not Currently    Comment: occasional  . Drug use: Yes    Types: Marijuana    Comment: rarely use  . Sexual activity: Not Currently  Other Topics Concern  . Not on file  Social History Narrative   Lives in [place] with spouse.  Works as a Air traffic controller.   Social Determinants of Health   Financial Resource Strain:   . Difficulty of Paying Living Expenses: Not on file  Food Insecurity:   . Worried About Charity fundraiser in the Last Year: Not on file  . Ran Out of Food in the Last Year: Not on file  Transportation Needs:   . Lack of Transportation (Medical): Not on file  . Lack of Transportation (Non-Medical): Not on file  Physical Activity:   . Days of Exercise per Week: Not on file  . Minutes of Exercise per Session: Not on file  Stress:   . Feeling of Stress : Not on file  Social Connections:   . Frequency of Communication with Friends and Family: Not on file  . Frequency of Social Gatherings with Friends and Family: Not on file  . Attends Religious Services: Not on file  .  Active Member of Clubs or Organizations: Not on file  . Attends Archivist Meetings: Not on file  . Marital Status: Not on file  Intimate Partner Violence:   . Fear of Current or Ex-Partner: Not on file  . Emotionally Abused: Not on file  . Physically Abused: Not on file  . Sexually Abused: Not on file    Family History  Problem Relation Age of Onset  . Heart failure Mother   . Stroke Father     ROS- All systems are reviewed and negative except as per the HPI above  Physical Exam: Vitals:   02/18/20 1023  BP: 130/68  Pulse: 70  Weight: 89.7 kg  Height: 5\' 11"  (1.803 m)   Wt Readings from Last 3 Encounters:  02/18/20 89.7 kg  01/23/20 90.1 kg  01/21/20 90 kg    Labs: Lab Results  Component Value Date   NA 136 01/23/2020   K 4.5 01/23/2020   CL 96 (L) 01/23/2020   CO2 26 01/23/2020   GLUCOSE 166 (H) 01/23/2020   BUN 32 (H) 01/23/2020   CREATININE 1.56 (H) 01/23/2020   CALCIUM 9.5 01/23/2020   PHOS 4.3 12/14/2019   MG 2.4 01/23/2020   Lab Results  Component Value Date   INR 1.6 (H) 10/18/2019   Lab Results  Component Value Date   CHOL 114 (L) 08/03/2015   HDL 30 (L) 08/03/2015   LDLCALC 62 08/03/2015   TRIG 111 08/03/2015     GEN- The patient is well appearing, alert and oriented x  3 today.   Head- normocephalic, atraumatic Eyes-  Sclera clear, conjunctiva pink Ears- hearing intact Oropharynx- clear Neck- supple, no JVP Lymph- no cervical lymphadenopathy Lungs- Clear to ausculation bilaterally, normal work of breathing Heart- irregular rate and rhythm, no murmurs, rubs or gallops, PMI not laterally displaced GI- soft, NT, ND, + BS Extremities- no clubbing, cyanosis, or edema MS- no significant deformity or atrophy Skin- no rash or lesion Psych- euthymic mood, full affect Neuro- strength and sensation are intact  EKG-v paced rhythm at 70 bpm, qrs int 198 ms, qtc 537 ms  [place] transmission received- 10/19- Ongoing AF since 01/12/20.   Current AF burden 97%.  Pt has history of persistent AF occurring with COPD exacerbation, requiring DCCV to convert.  Pt hospitalized 9/24 for COPD exacerbation.  Current medications include Tikosyn 250 mcg BID, Bisoprolol 5 mg daily, Eliquis 5 mg BID.  Spoke with pt, he was not aware of being in AF, denies any current symptoms.  Pt confirmed compliance with medications as ordered.    Assessment and Plan: 1. afib  Recent breakthrough with COPD exacerbation in September and persists to present Will plan for cardioversion  Continue tikosyn at 250 mcg bid  Continue bisoprolol at 5 mg bid  Bmet/mag/cbc/covid today  No covid vaccines   2. CHA2DS2VASc score of at least 4 Continue eliquis 5 mg bid (states no missed doses for at least 3 weeks) and reminded not to interrupt anticoagulation  for 4 weeks s/p cardioversion  3.COPD Improved since d/c 9/26 for COPD exacerabation   4. HF Weight stable   F/u with Dr. Haroldine Laws  11/16 and Dr. Rayann Heman 07/13/19  Geroge Baseman. Laikyn Gewirtz, [facility] [address] [place], [place] [address] [phone]

## 2020-02-18 NOTE — Progress Notes (Signed)
Primary Care Physician: Velna Hatchet, MD Referring Physician  Dr. Rayann Heman  AHF: Dr. Virgina Jock is a 69 y.o. male with a h/o advanced COPD on home O2, with frequent flareup,A. fib on Tikosyn and systemic anticoagulation, IIDM. Patient presented to the ED on 6/19with increasing short of breath,wheezing, cough and poor oral intake for 3 days. Denies any fever chills.   Patient came to ED a day earlier for same acute issue and was discharged with p.o. steroid.His symptoms persisted and he was also not able to take his regular medications including Tikosyn. He returned back to ED on 6/19.   He had missed a couple of days of his Tikosyn as he felt nauseated and could not take his meds.He was in  afib.  EP saw pt in the hospital and restarted tikosyn He underwent successful cardioversion on 6/22. For his COPD he had po doxycycline in the hospital as well as IV steroids.   In the afib clinic , today, he is in [place] and feels improved. His breathing is also improved.  He is a sensed, v paced today. Being compliant with his meds.   F/u in afib clinic, 02/18/20. Per device clinic he has been in persistent  afib since 01/12/20. He was hospitalized 9/24 for COPD exacerbation. He continues on Tikosyn 250 mcg bid. He has not missed any  eliquis 5 mg bid for at the least 3 weeks. No Vaccines. CHA2DS2VASc score of at least 5. Wearing O2 via continuous [place].   Today, he denies symptoms of palpitations, chest pain, shortness of breath, orthopnea, PND, lower extremity edema, dizziness, presyncope, syncope, or neurologic sequela. The patient is tolerating medications without difficulties and is otherwise without complaint today.   Past Medical History:  Diagnosis Date  . Aortic stenosis, mild   . Arthritis of hand    "just a little bit in both hands" (03/31/2012)  . Asthma    "little bit" (03/31/2012)  . Atrial fibrillation [facility])    dx '04; DCCV '04, placed on flecainide, failed DCCV  04/2010, flecainide stopped; s/p successful A.fib ablation 01/31/12  . CHF (congestive heart failure) ([place])   . Controlled type 2 diabetes mellitus without complication, without long-term current use of insulin ([place]) 12/13/2019  . COPD (chronic obstructive pulmonary disease) ([place])   . DJD (degenerative joint disease)   . Fibromyalgia   . First degree AV block    post ablation, ~469ms  . GERD (gastroesophageal reflux disease)   . Hyperlipidemia   . Hypertension   . Hypothyroidism    S/P radiation  . Left atrial enlargement    LA size 25mm by echo 11/21/11  . Mitral regurgitation    trivial  . Obstructive sleep apnea    mild by sleep study 2013; pt stated he does not have a machine because "it wasn't bad enough for him to have one"  . Pacemaker 03/31/2012  . Restless leg syndrome   . Second degree AV block   . Splenomegaly   . Visit for monitoring Tikosyn therapy 04/16/2016   Past Surgical History:  Procedure Laterality Date  . AORTIC VALVE REPLACEMENT N/A 08/22/2015   Procedure: AORTIC VALVE REPLACEMENT (AVR);  Surgeon: Ivin Poot, MD;  Location: [place];  Service: Open Heart Surgery;  Laterality: N/A;  . ATRIAL FIBRILLATION ABLATION  01/30/2012   PVI by Dr. Rayann Heman  . ATRIAL FIBRILLATION ABLATION N/A 01/31/2012   Procedure: ATRIAL FIBRILLATION ABLATION;  Surgeon: Thompson Grayer, MD;  Location: [facility]  CATH LAB;  Service: Cardiovascular;  Laterality: N/A;  . BACK SURGERY     X 3  . CARDIAC CATHETERIZATION N/A 08/09/2015   Procedure: Right/Left Heart Cath and Coronary Angiography;  Surgeon: Peter M Martinique, MD;  Location: [place] CV LAB;  Service: Cardiovascular;  Laterality: N/A;  . CARDIAC CATHETERIZATION N/A 02/05/2016   Procedure: Right/Left Heart Cath and Coronary Angiography;  Surgeon: Jettie Booze, MD;  Location: [place] CV LAB;  Service: Cardiovascular;  Laterality: N/A;  . CARDIAC CATHETERIZATION N/A 04/17/2016   Procedure: Right Heart Cath;  Surgeon: Jolaine Artist,  MD;  Location: [place] CV LAB;  Service: Cardiovascular;  Laterality: N/A;  . CARDIOVASCULAR STRESS TEST  03/12/2002   EF 48%, NO EVIDENCE OF ISCHEMIA  . CARDIOVERSION  01/2012; 03/31/2012  . CARDIOVERSION N/A 02/25/2012   Procedure: CARDIOVERSION;  Surgeon: Thompson Grayer, MD;  Location: [facility] CATH LAB;  Service: Cardiovascular;  Laterality: N/A;  . CARDIOVERSION N/A 03/31/2012   Procedure: CARDIOVERSION;  Surgeon: Thompson Grayer, MD;  Location: [facility] CATH LAB;  Service: Cardiovascular;  Laterality: N/A;  . CARDIOVERSION N/A 11/23/2015   Procedure: CARDIOVERSION;  Surgeon: Larey Dresser, MD;  Location: [place];  Service: Cardiovascular;  Laterality: N/A;  . CARDIOVERSION N/A 04/08/2016   Procedure: CARDIOVERSION;  Surgeon: Jolaine Artist, MD;  Location: [facility] ENDOSCOPY;  Service: Cardiovascular;  Laterality: N/A;  . CARDIOVERSION N/A 09/14/2018   Procedure: CARDIOVERSION;  Surgeon: Josue Hector, MD;  Location: [facility] ENDOSCOPY;  Service: Cardiovascular;  Laterality: N/A;  . CARDIOVERSION N/A 10/19/2019   Procedure: CARDIOVERSION;  Surgeon: Jolaine Artist, MD;  Location: [place];  Service: Cardiovascular;  Laterality: N/A;  . CLIPPING OF ATRIAL APPENDAGE N/A 08/22/2015   Procedure: CLIPPING OF ATRIAL APPENDAGE;  Surgeon: Ivin Poot, MD;  Location: [place];  Service: Open Heart Surgery;  Laterality: N/A;  . DOPPLER ECHOCARDIOGRAPHY  03/11/2002   EF 70-75%  . FINGER SURGERY Left    Middle finger  . FINGER TENDON REPAIR  1980's   "right little finger" (03/31/2012)  . INSERT / REPLACE / REMOVE PACEMAKER     [employer]  . IR RADIOLOGIST EVAL & MGMT  05/06/2019  . IR RADIOLOGIST EVAL & MGMT  10/06/2019  . LEFT AND RIGHT HEART CATHETERIZATION WITH CORONARY ANGIOGRAM N/A 10/27/2012   Procedure: LEFT AND RIGHT HEART CATHETERIZATION WITH CORONARY ANGIOGRAM;  Surgeon: Laverda Page, MD;  Location: [facility] CATH LAB;  Service: Cardiovascular;  Laterality: N/A;  . LUMBAR DISC SURGERY  1980's X2;  2000's   . MAZE N/A 08/22/2015   Procedure: MAZE;  Surgeon: Ivin Poot, MD;  Location: [place];  Service: Open Heart Surgery;  Laterality: N/A;  . PACEMAKER INSERTION  03/31/2012   STJ Accent DR pacemaker implanted by Dr Rayann Heman  . PERMANENT PACEMAKER INSERTION N/A 03/31/2012   Procedure: PERMANENT PACEMAKER INSERTION;  Surgeon: Thompson Grayer, MD;  Location: [facility] CATH LAB;  Service: Cardiovascular;  Laterality: N/A;  . TEE WITHOUT CARDIOVERSION  01/30/2012   Procedure: TRANSESOPHAGEAL ECHOCARDIOGRAM (TEE);  Surgeon: Larey Dresser, MD;  Location: [place];  Service: Cardiovascular;  Laterality: N/A;  ablation next day  . TEE WITHOUT CARDIOVERSION N/A 08/22/2015   Procedure: TRANSESOPHAGEAL ECHOCARDIOGRAM (TEE);  Surgeon: Ivin Poot, MD;  Location: [place];  Service: Open Heart Surgery;  Laterality: N/A;  . TEE WITHOUT CARDIOVERSION N/A 10/19/2019   Procedure: TRANSESOPHAGEAL ECHOCARDIOGRAM (TEE);  Surgeon: Jolaine Artist, MD;  Location: [place];  Service: Cardiovascular;  Laterality: N/A;  .  US ECHOCARDIOGRAPHY  08/07/2009   EF 55-60%    Current Outpatient Medications  Medication Sig Dispense Refill  . albuterol (PROVENTIL HFA;VENTOLIN HFA) 108 (90 Base) MCG/ACT inhaler Inhale 2 puffs into the lungs every 6 (six) hours as needed for wheezing or shortness of breath. 1 Inhaler 5  . amLODipine (NORVASC) 10 MG tablet Take 0.5 tablets (5 mg total) by mouth daily. 30 tablet 11  . apixaban (ELIQUIS) 5 MG TABS tablet TAKE 1 TABLET(5 MG) BY MOUTH TWICE DAILY (Patient taking differently: Take 5 mg by mouth 2 (two) times daily. ) 180 tablet 3  . bisoprolol (ZEBETA) 5 MG tablet TAKE 1 TABLET(5 MG) BY MOUTH DAILY 30 tablet 6  . Budeson-Glycopyrrol-Formoterol (BREZTRI AEROSPHERE) 160-9-4.8 MCG/ACT AERO Inhale 2 puffs into the lungs 2 (two) times daily. 4 g 0  . Cholecalciferol (VITAMIN D) 2000 UNITS CAPS Take 8,000 Units by mouth at bedtime.     . dofetilide (TIKOSYN) 250 MCG capsule Take 1 capsule (250  mcg total) by mouth 2 (two) times daily. 180 capsule 1  . Ensure Max Protein (ENSURE MAX PROTEIN) LIQD Take 330 mLs (11 oz total) by mouth 2 (two) times daily. 3300 mL 0  . fexofenadine (ALLEGRA) 180 MG tablet Take 1 tablet (180 mg total) by mouth daily as needed for allergies or rhinitis.    [name] gabapentin (NEURONTIN) 300 MG capsule Take 600-1,200 mg by mouth See admin instructions. Take 2 capsules ( 600 mg) by mouth in the morning and  at lunchtime, and take 4 capsules (1,200 mg) at bedtime  3  . HYDROcodone-acetaminophen (NORCO/VICODIN) 5-325 MG tablet Take 1 tablet by mouth 2 (two) times daily as needed (pain).     [name] levothyroxine (SYNTHROID, LEVOTHROID) 175 MCG tablet Take 175 mcg by mouth daily before breakfast.    . MAGNESIUM PO Take 400 mcg by mouth at bedtime. Has zinc/calcium    . Multiple Vitamin (MULTIVITAMIN WITH MINERALS) TABS tablet Take 1 tablet by mouth daily. 30 tablet 0  . ondansetron (ZOFRAN ODT) 4 MG disintegrating tablet Take 1 tablet (4 mg total) by mouth every 8 (eight) hours as needed. 20 tablet 0  . OXYGEN Inhale 2 L into the lungs continuous.    . pantoprazole (PROTONIX) 40 MG tablet Take 40 mg by mouth daily.    . polyethylene glycol powder (GLYCOLAX/MIRALAX) 17 GM/SCOOP powder Take 17 g by mouth daily as needed for mild constipation.    . potassium chloride SA (KLOR-CON) 20 MEQ tablet Take 1 tablet (20 mEq total) by mouth 2 (two) times daily. Needs appt for further refills 180 tablet 0  . predniSONE (DELTASONE) 5 MG tablet Take 1 tablet (5 mg total) by mouth daily with breakfast. 30 tablet 3  . rOPINIRole (REQUIP) 1 MG tablet Take 1 tablet (1 mg total) by mouth in the morning and at bedtime.    . rosuvastatin (CRESTOR) 10 MG tablet Take 10 mg by mouth at bedtime.     [name] spironolactone (ALDACTONE) 25 MG tablet Take 1 tablet (25 mg total) by mouth daily. 90 tablet 3  . torsemide (DEMADEX) 20 MG tablet Take 1 tablet (20 mg total) by mouth every other day. 45 tablet 3  .  traMADol-acetaminophen (ULTRACET) 37.5-325 MG tablet Take 2 tablets by mouth 2 (two) times daily.     No current facility-administered medications for this encounter.    Allergies  Allergen Reactions  . Meloxicam Rash  . 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 syndrome 09/02/15, resolved  with diphenhydramine and slowing of rate    Social History   Socioeconomic History  . Marital status: Married    Spouse name: Not on file  . Number of children: Not on file  . Years of education: Not on file  . Highest education level: Not on file  Occupational History  . Occupation: retired  Tobacco Use  . Smoking status: Former Smoker    Packs/day: 1.00    Years: 25.00    Pack years: 25.00    Types: Cigarettes    Quit date: 07/18/1991    Years since quitting: 28.6  . Smokeless tobacco: Never Used  Vaping Use  . Vaping Use: Never used  Substance and Sexual Activity  . Alcohol use: Not Currently    Comment: occasional  . Drug use: Yes    Types: Marijuana    Comment: rarely use  . Sexual activity: Not Currently  Other Topics Concern  . Not on file  Social History Narrative   Lives in [place] with spouse.  Works as a Air traffic controller.   Social Determinants of Health   Financial Resource Strain:   . Difficulty of Paying Living Expenses: Not on file  Food Insecurity:   . Worried About Charity fundraiser in the Last Year: Not on file  . Ran Out of Food in the Last Year: Not on file  Transportation Needs:   . Lack of Transportation (Medical): Not on file  . Lack of Transportation (Non-Medical): Not on file  Physical Activity:   . Days of Exercise per Week: Not on file  . Minutes of Exercise per Session: Not on file  Stress:   . Feeling of Stress : Not on file  Social Connections:   . Frequency of Communication with Friends and Family: Not on file  . Frequency of Social Gatherings with Friends and Family: Not on file  . Attends Religious Services: Not on file  .  Active Member of Clubs or Organizations: Not on file  . Attends Archivist Meetings: Not on file  . Marital Status: Not on file  Intimate Partner Violence:   . Fear of Current or Ex-Partner: Not on file  . Emotionally Abused: Not on file  . Physically Abused: Not on file  . Sexually Abused: Not on file    Family History  Problem Relation Age of Onset  . Heart failure Mother   . Stroke Father     ROS- All systems are reviewed and negative except as per the HPI above  Physical Exam: Vitals:   02/18/20 1023  BP: 130/68  Pulse: 70  Weight: 89.7 kg  Height: 5\' 11"  (1.803 m)   Wt Readings from Last 3 Encounters:  02/18/20 89.7 kg  01/23/20 90.1 kg  01/21/20 90 kg    Labs: Lab Results  Component Value Date   NA 136 01/23/2020   K 4.5 01/23/2020   CL 96 (L) 01/23/2020   CO2 26 01/23/2020   GLUCOSE 166 (H) 01/23/2020   BUN 32 (H) 01/23/2020   CREATININE 1.56 (H) 01/23/2020   CALCIUM 9.5 01/23/2020   PHOS 4.3 12/14/2019   MG 2.4 01/23/2020   Lab Results  Component Value Date   INR 1.6 (H) 10/18/2019   Lab Results  Component Value Date   CHOL 114 (L) 08/03/2015   HDL 30 (L) 08/03/2015   LDLCALC 62 08/03/2015   TRIG 111 08/03/2015     GEN- The patient is well appearing, alert and oriented x  3 today.   Head- normocephalic, atraumatic Eyes-  Sclera clear, conjunctiva pink Ears- hearing intact Oropharynx- clear Neck- supple, no JVP Lymph- no cervical lymphadenopathy Lungs- Clear to ausculation bilaterally, normal work of breathing Heart- irregular rate and rhythm, no murmurs, rubs or gallops, PMI not laterally displaced GI- soft, NT, ND, + BS Extremities- no clubbing, cyanosis, or edema MS- no significant deformity or atrophy Skin- no rash or lesion Psych- euthymic mood, full affect Neuro- strength and sensation are intact  EKG-v paced rhythm at 70 bpm, qrs int 198 ms, qtc 537 ms  Merlin transmission received- 10/19- Ongoing AF since 01/12/20.   Current AF burden 97%.  Pt has history of persistent AF occurring with COPD exacerbation, requiring DCCV to convert.  Pt hospitalized 9/24 for COPD exacerbation.  Current medications include Tikosyn 250 mcg BID, Bisoprolol 5 mg daily, Eliquis 5 mg BID.  Spoke with pt, he was not aware of being in AF, denies any current symptoms.  Pt confirmed compliance with medications as ordered.    Assessment and Plan: 1. afib  Recent breakthrough with COPD exacerbation in September and persists to present Will plan for cardioversion  Continue tikosyn at 250 mcg bid  Continue bisoprolol at 5 mg bid  Bmet/mag/cbc/covid today  No covid vaccines   2. CHA2DS2VASc score of at least 4 Continue eliquis 5 mg bid (states no missed doses for at least 3 weeks) and reminded not to interrupt anticoagulation  for 4 weeks s/p cardioversion  3.COPD Improved since d/c 9/26 for COPD exacerabation   4. HF Weight stable   F/u with Dr. Haroldine Laws  11/16 and Dr. Rayann Heman 07/13/19  Geroge Baseman. Andras Grunewald, [facility] [address] [place], [place] [address] [phone]

## 2020-02-21 ENCOUNTER — Telehealth: Payer: Self-pay

## 2020-02-21 NOTE — Telephone Encounter (Signed)
The pt wife wanted to know if he needs to have a battery change out because he is having a cardioversion? I told her his battery still at 2.66 and he do not need a change out until the battery reach 2.60. The nurse will call when he needs the change out.

## 2020-02-23 ENCOUNTER — Other Ambulatory Visit (HOSPITAL_COMMUNITY)
Admission: RE | Admit: 2020-02-23 | Discharge: 2020-02-23 | Disposition: A | Discharge status: Home / Self Care | Payer: Medicare Other | Source: Ambulatory Visit | Attending: Internal Medicine | Admitting: Internal Medicine

## 2020-02-23 DIAGNOSIS — Z01818 Encounter for other preprocedural examination: Secondary | ICD-10-CM | POA: Insufficient documentation

## 2020-02-23 DIAGNOSIS — Z20822 Contact with and (suspected) exposure to covid-19: Secondary | ICD-10-CM | POA: Diagnosis not present

## 2020-02-23 LAB — SARS CORONAVIRUS 2 (TAT 6-24 HRS): SARS Coronavirus 2: NEGATIVE

## 2020-02-25 ENCOUNTER — Ambulatory Visit (HOSPITAL_COMMUNITY)
Admission: RE | Admit: 2020-02-25 | Discharge: 2020-02-25 | Disposition: A | Discharge status: Home / Self Care | Payer: Medicare Other | Attending: Internal Medicine | Admitting: Internal Medicine

## 2020-02-25 ENCOUNTER — Ambulatory Visit (HOSPITAL_COMMUNITY): Payer: Medicare Other | Admitting: Anesthesiology

## 2020-02-25 ENCOUNTER — Other Ambulatory Visit: Payer: Self-pay

## 2020-02-25 ENCOUNTER — Encounter (HOSPITAL_COMMUNITY): Payer: Self-pay | Admitting: Internal Medicine

## 2020-02-25 ENCOUNTER — Encounter (HOSPITAL_COMMUNITY)
Admission: RE | Disposition: A | Discharge status: Home / Self Care | Payer: Self-pay | Source: Home / Self Care | Attending: Internal Medicine

## 2020-02-25 DIAGNOSIS — Z881 Allergy status to other antibiotic agents status: Secondary | ICD-10-CM | POA: Diagnosis not present

## 2020-02-25 DIAGNOSIS — Z7901 Long term (current) use of anticoagulants: Secondary | ICD-10-CM | POA: Insufficient documentation

## 2020-02-25 DIAGNOSIS — J449 Chronic obstructive pulmonary disease, unspecified: Secondary | ICD-10-CM | POA: Diagnosis not present

## 2020-02-25 DIAGNOSIS — Z87891 Personal history of nicotine dependence: Secondary | ICD-10-CM | POA: Insufficient documentation

## 2020-02-25 DIAGNOSIS — I509 Heart failure, unspecified: Secondary | ICD-10-CM | POA: Diagnosis not present

## 2020-02-25 DIAGNOSIS — I4891 Unspecified atrial fibrillation: Secondary | ICD-10-CM | POA: Diagnosis present

## 2020-02-25 HISTORY — PX: CARDIOVERSION: SHX1299

## 2020-02-25 SURGERY — CARDIOVERSION
Anesthesia: General

## 2020-02-25 MED ORDER — SODIUM CHLORIDE 0.9 % IV SOLN: INTRAVENOUS | Status: DC | PRN

## 2020-02-25 MED ORDER — PROPOFOL 10 MG/ML IV BOLUS
INTRAVENOUS | Status: DC | PRN
  Administered 2020-02-25: 80 mg via INTRAVENOUS

## 2020-02-25 MED ORDER — SODIUM CHLORIDE 0.9 % IV SOLN: Freq: Once | INTRAVENOUS | Status: AC

## 2020-02-25 MED ORDER — LIDOCAINE 2% (20 MG/ML) 5 ML SYRINGE
INTRAMUSCULAR | Status: DC | PRN
  Administered 2020-02-25: 100 mg via INTRAVENOUS

## 2020-02-25 NOTE — Anesthesia Postprocedure Evaluation (Signed)
Anesthesia Post Note  Patient: [name]  Procedure(s) Performed: CARDIOVERSION (N/A )     Patient location during evaluation: Endoscopy Anesthesia Type: General Level of consciousness: sedated and patient cooperative Pain management: pain level controlled Vital Signs Assessment: post-procedure vital signs reviewed and stable Respiratory status: spontaneous breathing Cardiovascular status: stable Anesthetic complications: no   No complications documented.  Last Vitals:  Vitals:   02/25/20 1050 02/25/20 1100  BP: (!) 118/58 130/60  Pulse: 63 (!) 59  Resp: (!) 23 (!) 21  Temp:    SpO2: 94% 93%    Last Pain:  Vitals:   02/25/20 1050  TempSrc:   PainSc: 0-No pain                 Nolon Nations

## 2020-02-25 NOTE — Anesthesia Preprocedure Evaluation (Signed)
Anesthesia Evaluation  Patient identified by MRN, date of birth, ID band Patient awake    Reviewed: Allergy & Precautions, H&P , NPO status , Patient's Chart, lab work & pertinent test results, reviewed documented beta blocker date and time   Airway Mallampati: I  TM Distance: >3 FB Neck ROM: Full    Dental  (+) Edentulous Upper, Dental Advisory Given, Partial Lower, Missing   Pulmonary asthma , sleep apnea , COPD,  COPD inhaler, former smoker,    Pulmonary exam normal breath sounds clear to auscultation       Cardiovascular hypertension, Pt. on medications and Pt. on home beta blockers + CAD and +CHF  + dysrhythmias Atrial Fibrillation + pacemaker + Valvular Problems/Murmurs AS  Rhythm:Regular Rate:Normal  Ech0 11/2019 1. Left ventricular ejection fraction, by estimation, is 60 to 65%. The left ventricle has normal function. The left ventricle has no regional wall motion abnormalities. There is mild left ventricular hypertrophy. Left ventricular diastolic parameters are indeterminate.  2. Pacing wires in RA/RV. Right ventricular systolic function is normal. The right ventricular size is normal. There is severely elevated pulmonary artery systolic pressure.  3. Left atrial size was moderately dilated.  4. The mitral valve is normal in structure. Mild mitral valve  regurgitation. No evidence of mitral stenosis.  5. Tricuspid valve regurgitation is mild to moderate.  6. Normal appearing bioprosthetic AVR with No PvL. The aortic valve has been repaired/replaced. Aortic valve regurgitation is not visualized. No aortic stenosis is present.  7. The inferior vena cava is normal in size with greater than 50% respiratory variability, suggesting right atrial pressure of 3 mmHg.    Neuro/Psych negative neurological ROS  negative psych ROS   GI/Hepatic Neg liver ROS, GERD  Medicated and Controlled,  Endo/Other  diabetesHypothyroidism    Renal/GU Renal disease  negative genitourinary   Musculoskeletal  (+) Arthritis , Osteoarthritis,  Fibromyalgia -  Abdominal   Peds  Hematology negative hematology ROS (+)   Anesthesia Other Findings   Reproductive/Obstetrics negative OB ROS                             Anesthesia Physical  Anesthesia Plan  ASA: III  Anesthesia Plan: General   Post-op Pain Management:    Induction: Intravenous  PONV Risk Score and Plan: 2 and Propofol infusion, Treatment may vary due to age or medical condition and TIVA  Airway Management Planned: Mask  Additional Equipment:   Intra-op Plan:   Post-operative Plan:   Informed Consent: I have reviewed the patients History and Physical, chart, labs and discussed the procedure including the risks, benefits and alternatives for the proposed anesthesia with the patient or authorized representative who has indicated his/her understanding and acceptance.     Dental advisory given  Plan Discussed with: CRNA  Anesthesia Plan Comments:         Anesthesia Quick Evaluation

## 2020-02-25 NOTE — CV Procedure (Signed)
CARDIOVERSION  Pt sedated by anesthesia with lidocaine and Propofol intravenously  With pads in the AP position, patient cardioverted to SR with 200 J synchronized biphasic energy Pacer interrogated by [employer]  Procedure was without complication.  Dorris Carnes MD

## 2020-02-25 NOTE — Interval H&P Note (Signed)
History and Physical Interval Note:  02/25/2020 9:59 AM  [name]  has presented today for surgery, with the diagnosis of A-FIB.  The various methods of treatment have been discussed with the patient and family. After consideration of risks, benefits and other options for treatment, the patient has consented to  Procedure(s): CARDIOVERSION (N/A) as a surgical intervention.  The patient's history has been reviewed, patient examined, no change in status, stable for surgery.  I have reviewed the patient's chart and labs.  Questions were answered to the patient's satisfaction.     Dorris Carnes

## 2020-02-25 NOTE — Transfer of Care (Signed)
Immediate Anesthesia Transfer of Care Note  Patient: [name]  Procedure(s) Performed: CARDIOVERSION (N/A )  Patient Location: Endoscopy Unit  Anesthesia Type:General  Level of Consciousness: awake, alert  and oriented  Airway & Oxygen Therapy: Patient Spontanous Breathing and Patient connected to nasal cannula oxygen  Post-op Assessment: Report given to RN and Post -op Vital signs reviewed and stable  Post vital signs: Reviewed and stable  Last Vitals:  Vitals Value Taken Time  BP 125/68   Temp    Pulse 60   Resp 14   SpO2 96%     Last Pain:  Vitals:   02/25/20 1002  TempSrc: Oral  PainSc: 0-No pain         Complications: No complications documented.

## 2020-02-25 NOTE — Discharge Instructions (Signed)
Electrical Cardioversion Electrical cardioversion is the delivery of a jolt of electricity to restore a normal rhythm to the heart. A rhythm that is too fast or is not regular keeps the heart from pumping well. In this procedure, sticky patches or metal paddles are placed on the chest to deliver electricity to the heart from a device. This procedure may be done in an emergency if:  There is low or no blood pressure as a result of the heart rhythm.  Normal rhythm must be restored as fast as possible to protect the brain and heart from further damage.  It may save a life. This may also be a scheduled procedure for irregular or fast heart rhythms that are not immediately life-threatening. Follow these instructions at home:  Do not drive for 24 hours if you were given a sedative during your procedure.  Take over-the-counter and prescription medicines only as told by your health care provider.  Ask your health care provider how to check your pulse. Check it often.  Rest for 48 hours after the procedure or as told by your health care provider.  Avoid or limit your caffeine use as told by your health care provider.  Keep all follow-up visits as told by your health care provider. This is important. Contact a health care provider if:  You feel like your heart is beating too quickly or your pulse is not regular.  You have a serious muscle cramp that does not go away. Get help right away if:  You have discomfort in your chest.  You are dizzy or you feel faint.  You have trouble breathing or you are short of breath.  Your speech is slurred.  You have trouble moving an arm or leg on one side of your body.  Your fingers or toes turn cold or blue. Summary  Electrical cardioversion is the delivery of a jolt of electricity to restore a normal rhythm to the heart.  This procedure may be done right away in an emergency or may be a scheduled procedure if the condition is not an  emergency.  Generally, this is a safe procedure.  After the procedure, check your pulse often as told by your health care provider. This information is not intended to replace advice given to you by your health care provider. Make sure you discuss any questions you have with your health care provider. Document Revised: 11/16/2018 Document Reviewed: 11/16/2018 Elsevier Patient Education  2020 [employer]  

## 2020-02-27 ENCOUNTER — Encounter (HOSPITAL_COMMUNITY): Payer: Self-pay | Admitting: Internal Medicine

## 2020-03-13 ENCOUNTER — Ambulatory Visit (INDEPENDENT_AMBULATORY_CARE_PROVIDER_SITE_OTHER): Payer: Medicare Other

## 2020-03-13 DIAGNOSIS — I440 Atrioventricular block, first degree: Secondary | ICD-10-CM

## 2020-03-14 ENCOUNTER — Encounter (HOSPITAL_COMMUNITY): Payer: Self-pay | Admitting: Internal Medicine

## 2020-03-14 ENCOUNTER — Other Ambulatory Visit: Payer: Self-pay

## 2020-03-14 ENCOUNTER — Ambulatory Visit (HOSPITAL_COMMUNITY)
Admission: RE | Admit: 2020-03-14 | Discharge: 2020-03-14 | Disposition: A | Discharge status: Home / Self Care | Payer: Medicare Other | Source: Ambulatory Visit | Attending: Internal Medicine | Admitting: Internal Medicine

## 2020-03-14 VITALS — BP 122/80 | HR 70 | Wt 200.0 lb

## 2020-03-14 DIAGNOSIS — I480 Paroxysmal atrial fibrillation: Secondary | ICD-10-CM | POA: Insufficient documentation

## 2020-03-14 DIAGNOSIS — I110 Hypertensive heart disease with heart failure: Secondary | ICD-10-CM | POA: Diagnosis present

## 2020-03-14 DIAGNOSIS — I5032 Chronic diastolic (congestive) heart failure: Secondary | ICD-10-CM

## 2020-03-14 DIAGNOSIS — I4819 Other persistent atrial fibrillation: Secondary | ICD-10-CM | POA: Diagnosis not present

## 2020-03-14 DIAGNOSIS — Z791 Long term (current) use of non-steroidal anti-inflammatories (NSAID): Secondary | ICD-10-CM | POA: Insufficient documentation

## 2020-03-14 DIAGNOSIS — I350 Nonrheumatic aortic (valve) stenosis: Secondary | ICD-10-CM | POA: Insufficient documentation

## 2020-03-14 DIAGNOSIS — Z7901 Long term (current) use of anticoagulants: Secondary | ICD-10-CM | POA: Insufficient documentation

## 2020-03-14 DIAGNOSIS — Z8249 Family history of ischemic heart disease and other diseases of the circulatory system: Secondary | ICD-10-CM | POA: Diagnosis not present

## 2020-03-14 DIAGNOSIS — Z7952 Long term (current) use of systemic steroids: Secondary | ICD-10-CM | POA: Diagnosis not present

## 2020-03-14 DIAGNOSIS — I272 Pulmonary hypertension, unspecified: Secondary | ICD-10-CM | POA: Diagnosis not present

## 2020-03-14 DIAGNOSIS — Z79890 Hormone replacement therapy (postmenopausal): Secondary | ICD-10-CM | POA: Insufficient documentation

## 2020-03-14 DIAGNOSIS — J449 Chronic obstructive pulmonary disease, unspecified: Secondary | ICD-10-CM | POA: Diagnosis not present

## 2020-03-14 DIAGNOSIS — Z952 Presence of prosthetic heart valve: Secondary | ICD-10-CM | POA: Diagnosis not present

## 2020-03-14 DIAGNOSIS — I2510 Atherosclerotic heart disease of native coronary artery without angina pectoris: Secondary | ICD-10-CM | POA: Insufficient documentation

## 2020-03-14 DIAGNOSIS — J9611 Chronic respiratory failure with hypoxia: Secondary | ICD-10-CM | POA: Insufficient documentation

## 2020-03-14 DIAGNOSIS — E119 Type 2 diabetes mellitus without complications: Secondary | ICD-10-CM | POA: Insufficient documentation

## 2020-03-14 DIAGNOSIS — Z9981 Dependence on supplemental oxygen: Secondary | ICD-10-CM | POA: Insufficient documentation

## 2020-03-14 DIAGNOSIS — Z950 Presence of cardiac pacemaker: Secondary | ICD-10-CM | POA: Diagnosis not present

## 2020-03-14 DIAGNOSIS — Z79899 Other long term (current) drug therapy: Secondary | ICD-10-CM | POA: Insufficient documentation

## 2020-03-14 DIAGNOSIS — I5033 Acute on chronic diastolic (congestive) heart failure: Secondary | ICD-10-CM | POA: Diagnosis not present

## 2020-03-14 DIAGNOSIS — Z87891 Personal history of nicotine dependence: Secondary | ICD-10-CM | POA: Insufficient documentation

## 2020-03-14 DIAGNOSIS — I38 Endocarditis, valve unspecified: Secondary | ICD-10-CM | POA: Diagnosis not present

## 2020-03-14 LAB — BASIC METABOLIC PANEL
Anion gap: 11 (ref 5–15)
BUN: 23 mg/dL (ref 8–23)
CO2: 28 mmol/L (ref 22–32)
Calcium: 10 mg/dL (ref 8.9–10.3)
Chloride: 101 mmol/L (ref 98–111)
Creatinine, Ser: 1.82 mg/dL — ABNORMAL HIGH (ref 0.61–1.24)
GFR, Estimated: 39 mL/min — ABNORMAL LOW (ref >60)
Glucose, Bld: 123 mg/dL — ABNORMAL HIGH (ref 70–99)
Potassium: 4.6 mmol/L (ref 3.5–5.1)
Sodium: 140 mmol/L (ref 135–145)

## 2020-03-14 LAB — CBC
HCT: 39.8 % (ref 39.0–52.0)
Hemoglobin: 12.5 g/dL — ABNORMAL LOW (ref 13.0–17.0)
MCH: 30.1 pg (ref 26.0–34.0)
MCHC: 31.4 g/dL (ref 30.0–36.0)
MCV: 95.9 fL (ref 80.0–100.0)
Platelets: 170 10*3/uL (ref 150–400)
RBC: 4.15 MIL/uL — ABNORMAL LOW (ref 4.22–5.81)
RDW: 14.6 % (ref 11.5–15.5)
WBC: 9.3 10*3/uL (ref 4.0–10.5)
nRBC: 0 % (ref 0.0–0.2)

## 2020-03-14 LAB — MAGNESIUM: Magnesium: 2.3 mg/dL (ref 1.7–2.4)

## 2020-03-14 MED ORDER — EMPAGLIFLOZIN 10 MG PO TABS: 10.0000 mg | ORAL_TABLET | Freq: Every day | ORAL | 6 refills | Status: DC

## 2020-03-14 NOTE — Patient Instructions (Addendum)
Start Jardiance 10 mg (1 Tab) daily  A [facility] will call you for an appointment.  Your physician recommends that you schedule a follow-up appointment in:  4 months with echocardiogram  If you have any questions or concerns before your next appointment please send [place] a message through [place] or call our office at [phone].    TO LEAVE A MESSAGE FOR THE NURSE SELECT OPTION 2, PLEASE LEAVE A MESSAGE INCLUDING: . YOUR NAME . DATE OF BIRTH . CALL BACK NUMBER . REASON FOR CALL**this is important as we prioritize the call backs  [place] AS LONG AS YOU CALL BEFORE 4:00 PM   At the [facility], you and your health needs are our priority. As part of our continuing mission to provide you with exceptional heart care, we have created designated Provider Care Teams. These Care Teams include your primary Cardiologist (physician) and Advanced Practice Providers (APPs- Physician Assistants and Nurse Practitioners) who all work together to provide you with the care you need, when you need it.   You may see any of the following providers on your designated Care Team at your next follow up: [name] Dr Glori Bickers . Dr Loralie Champagne . Darrick Grinder, NP . Lyda Jester, PA . Audry Riles, PharmD   Please be sure to bring in all your medications bottles to every appointment.

## 2020-03-14 NOTE — Progress Notes (Unsigned)
ReDS Vest / Clip - 03/14/20 1500      ReDS Vest / Clip   Station Marker D    Ruler Value 35    ReDS Value Range Low volume    ReDS Actual Value 34    Anatomical Comments sitting

## 2020-03-14 NOTE — Progress Notes (Signed)
Advanced Heart Failure Clinic Note   Primary Care: Velna Hatchet, MD Primary Cardiologist: Dr. Acie Fredrickson Primary HF: Dr. Haroldine Laws   HPI:  [name] is a 69 y.o. 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   Admitte 02/02/16 with severe DOE with any exercise or walking 10-20 feet. Pt admitted and planned for [facility] 02/05/16. Diuresed with IV lasix with rapid improvement of symptoms. Overall diuresed 15.2 L and down 14 lbs. Discharge weight was 205 lbs. L/RHC 02/05/16 with no significant CAD. RHC showed significant difference between PCWP (50) and LVEDP (30) suggestive of MS but no MS on Echo. Weight at d/c was 205 pounds.  Echo 02/02/16 LVEF 60-65%, Grade 3 DD. Mild AI, Mild MR, RV mildly dilated and severely reduced. RA mildly dilated. PA peak pressure 74 mm Hg. Concerns for restrictive cardiomyopathy.  Admitted 12/19 -> 04/19/16 for Tikosyn load. RHC completed during that admission as below.   Admitted 6/21 with recurrent respiratory failure and lactic acidosis felt to be mostly COPD flare. Had recurrent AF and underwent TEE-DC-CV on 10/19/19. Tikosyn continued.  TEE results LEFT VENTRICLE: EF = 60% No WMA RIGHT VENTRICLE: Moderately HK LEFT ATRIUM: Moderately to severely dilated. LA diameter 5.1 cm LEFT ATRIAL APPENDAGE: Surgically amputated. No clot in residual small pouch AORTIC VALVE:  Bioprosthetic valve. Functions normally. No AI/AS MITRAL VALVE:    Slight restriction of posterior leaflet 3+ posterior MR TRICUSPID VALVE: Normal severe TR. RVSP ~ 23mmHG INTERATRIAL SEPTUM: No PFO/ASD  Admitted 01/21/20-01/23/20 with SOB/chills, leukocytosis (WBC 15.9k), concerning for COVID infection versus COPD exacerbation. Diuresed with IV lasix on presentation, CXR negative for pulmonary edema or infiltrates. No antibiotics administered, continuous 2L of oxygen,  prednisone 5 mg daily & inhalers daily for mild COPD exacerbation. Abdominal pain improved; CT abdomen unremarkable.   02/25/20 DCCV with Dr. Renee Ramus after procedure  Here with his wife. Feels poorly since being discharged from hospital after DCCV. Still SOB with minimal exertion. Using albuterol inhaler daily. No fever/chills or CP. States he feels out of rhythm. No edema, PND/orthopnea, but sleeps in recliner at baseline.  Does not weigh daily and wife says he drinks a lot of fluid daily. Taking torsemids 40 mg every other day and full medication compliance with all other medications.   Studies: ABI 06/24/18 Normal  PFTs 4/17  FEV1  1.6 (47%) FVC 2.25 (49%) DLCO 54%  L/RHC 02/05/16 - Mid RCA lesion, 10 %stenosed. - Ost Cx to Prox Cx lesion, 20 %stenosed. - LV end diastolic pressure is moderately elevated. - Hemodynamic findings consistent with severe pulmonary hypertension. - No evidence of discordance on the LV/RV tracing. No evidence of pericardial constriction. - Severly elevated PCWP. - PA sat 51%. CO 6.2 L/min. Cardiac index 2.87. - No significant aortic valve gradient. - There is no aortic valve stenosis.  RHC Procedural Findings: Hemodynamics (mmHg) RA mean 30 RV 87/9 (26) PA 100/56 (72) PCWP 51 LVEDP 31 AO 142/73 (100) Cardiac Output (Fick) 6.21 Cardiac Index (Fick) 2.87 PVR = 3.5 WU   RHC 04/17/16 RA = 13 RV = 66/3/15 PA = 69/23 (42) PCW = 21 (v=38) Fick cardiac output/index = 6.3/2.9 PVR = 3.8 WU Ao sat = 87% (radial artery ABG) PA sat = 43%, 43% SVC sat = 50%   Past Medical History:  Diagnosis Date  . Aortic stenosis, mild   . Arthritis  of hand    "just a little bit in both hands" (03/31/2012)  . Asthma    "little bit" (03/31/2012)  . Atrial fibrillation [facility])    dx '04; DCCV '04, placed on flecainide, failed DCCV 04/2010, flecainide stopped; s/p successful A.fib ablation 01/31/12  . CHF (congestive heart failure) ([place])   . Controlled type 2  diabetes mellitus without complication, without long-term current use of insulin ([place]) 12/13/2019  . COPD (chronic obstructive pulmonary disease) ([place])   . DJD (degenerative joint disease)   . Fibromyalgia   . First degree AV block    post ablation, ~432ms  . GERD (gastroesophageal reflux disease)   . Hyperlipidemia   . Hypertension   . Hypothyroidism    S/P radiation  . Left atrial enlargement    LA size 67mm by echo 11/21/11  . Mitral regurgitation    trivial  . Obstructive sleep apnea    mild by sleep study 2013; pt stated he does not have a machine because "it wasn't bad enough for him to have one"  . Pacemaker 03/31/2012  . Restless leg syndrome   . Second degree AV block   . Splenomegaly   . Visit for monitoring Tikosyn therapy 04/16/2016    Current Outpatient Medications  Medication Sig Dispense Refill  . albuterol (PROVENTIL HFA;VENTOLIN HFA) 108 (90 Base) MCG/ACT inhaler Inhale 2 puffs into the lungs every 6 (six) hours as needed for wheezing or shortness of breath. 1 Inhaler 5  . amLODipine (NORVASC) 10 MG tablet Take 0.5 tablets (5 mg total) by mouth daily. 30 tablet 11  . apixaban (ELIQUIS) 5 MG TABS tablet Take 5 mg by mouth 2 (two) times daily.    . bisoprolol (ZEBETA) 5 MG tablet Take 5 mg by mouth daily.    . Budeson-Glycopyrrol-Formoterol (BREZTRI AEROSPHERE) 160-9-4.8 MCG/ACT AERO Inhale 2 puffs into the lungs 2 (two) times daily. 4 g 0  . Cholecalciferol (VITAMIN D) 2000 UNITS CAPS Take 8,000 Units by mouth at bedtime.     . dofetilide (TIKOSYN) 250 MCG capsule Take 1 capsule (250 mcg total) by mouth 2 (two) times daily. 180 capsule 1  . fexofenadine (ALLEGRA) 180 MG tablet Take 1 tablet (180 mg total) by mouth daily as needed for allergies or rhinitis.    [name] gabapentin (NEURONTIN) 300 MG capsule Take 600-1,200 mg by mouth See admin instructions. Take 600 mg by mouth in the morning, 600 mg at lunchtime, and take 1200 mg at bedtime  3  . HYDROcodone-acetaminophen  (NORCO/VICODIN) 5-325 MG tablet Take 1 tablet by mouth 2 (two) times daily as needed (pain).     [name] levothyroxine (SYNTHROID, LEVOTHROID) 175 MCG tablet Take 175 mcg by mouth daily before breakfast.    . magnesium oxide (MAG-OX) 400 MG tablet Take 400 mg by mouth daily.    . Multiple Vitamin (MULTIVITAMIN WITH MINERALS) TABS tablet Take 1 tablet by mouth daily. 30 tablet 0  . ondansetron (ZOFRAN-ODT) 4 MG disintegrating tablet Take 4 mg by mouth every 8 (eight) hours as needed for nausea or vomiting.    . OXYGEN Inhale 2 L into the lungs continuous.    . pantoprazole (PROTONIX) 40 MG tablet Take 40 mg by mouth daily.    . polyethylene glycol powder (GLYCOLAX/MIRALAX) 17 GM/SCOOP powder Take 17 g by mouth daily as needed for mild constipation.    . Polyvinyl Alcohol-Povidone (REFRESH OP) Place 1 drop into both eyes daily as needed (dry eyes).    . potassium chloride (KLOR-CON) 10  MEQ tablet Take 20 mEq by mouth See admin instructions. Take 20 meq twice every other day (take only on torsemide days)    . predniSONE (DELTASONE) 5 MG tablet Take 1 tablet (5 mg total) by mouth daily with breakfast. 30 tablet 3  . protein supplement shake (PREMIER PROTEIN) LIQD Take 11 oz by mouth daily.    [name] rOPINIRole (REQUIP) 1 MG tablet Take 1 tablet (1 mg total) by mouth in the morning and at bedtime.    . rosuvastatin (CRESTOR) 10 MG tablet Take 10 mg by mouth at bedtime.     [name] spironolactone (ALDACTONE) 25 MG tablet Take 1 tablet (25 mg total) by mouth daily. 90 tablet 3  . torsemide (DEMADEX) 20 MG tablet Take 40 mg by mouth every other day.    . traMADol-acetaminophen (ULTRACET) 37.5-325 MG tablet Take 2 tablets by mouth 2 (two) times daily.     No current facility-administered medications for this encounter.    Allergies  Allergen Reactions  . Meloxicam Rash  . 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 syndrome 09/02/15, resolved with diphenhydramine and slowing of rate      Social History    Socioeconomic History  . Marital status: Married    Spouse name: Not on file  . Number of children: Not on file  . Years of education: Not on file  . Highest education level: Not on file  Occupational History  . Occupation: retired  Tobacco Use  . Smoking status: Former Smoker    Packs/day: 1.00    Years: 25.00    Pack years: 25.00    Types: Cigarettes    Quit date: 07/18/1991    Years since quitting: 28.6  . Smokeless tobacco: Never Used  Vaping Use  . Vaping Use: Never used  Substance and Sexual Activity  . Alcohol use: Not Currently    Comment: occasional  . Drug use: Yes    Types: Marijuana    Comment: rarely use  . Sexual activity: Not Currently  Other Topics Concern  . Not on file  Social History Narrative   Lives in [place] with spouse.  Works as a Air traffic controller.   Social Determinants of Health   Financial Resource Strain:   . Difficulty of Paying Living Expenses: Not on file  Food Insecurity:   . Worried About Charity fundraiser in the Last Year: Not on file  . Ran Out of Food in the Last Year: Not on file  Transportation Needs:   . Lack of Transportation (Medical): Not on file  . Lack of Transportation (Non-Medical): Not on file  Physical Activity:   . Days of Exercise per Week: Not on file  . Minutes of Exercise per Session: Not on file  Stress:   . Feeling of Stress : Not on file  Social Connections:   . Frequency of Communication with Friends and Family: Not on file  . Frequency of Social Gatherings with Friends and Family: Not on file  . Attends Religious Services: Not on file  . Active Member of Clubs or Organizations: Not on file  . Attends Archivist Meetings: Not on file  . Marital Status: Not on file  Intimate Partner Violence:   . Fear of Current or Ex-Partner: Not on file  . Emotionally Abused: Not on file  . Physically Abused: Not on file  . Sexually Abused: Not on file    Family History  Problem Relation Age  of Onset  .  Heart failure Mother   . Stroke Father     Vitals:   03/14/20 1048  BP: 122/80  Pulse: 70  SpO2: 98%  Weight: 90.7 kg    Wt Readings from Last 3 Encounters:  03/14/20 90.7 kg  02/25/20 87.1 kg  02/18/20 89.7 kg   ECG: V-paced, Afib 70's (personally reviewed) ReD's: 34%  Device Interrogation: >99% V-pacing with 97% AT/AF burden. Went out of rhythm 11/2 (personally reviewed)  PHYSICAL EXAM: General:  Looks tired. No resp difficulty HEENT: normal Neck: supple. JVD 4-5. Carotids 2+ bilat; no bruits. No lymphadenopathy or thryomegaly appreciated. Cor: PMI nondisplaced. Regular rate & rhythm. No rubs or gallops.  II/VI AS Lungs: LUL and LLL markedly diminished Abdomen: obese, soft, nontender, nondistended. No hepatosplenomegaly. No bruits or masses. Good bowel sounds. Extremities: no cyanosis, clubbing, rash, trace bilateral pedal edema, bilateral hemosiderinic changes to lower legs Neuro: alert & orientedx3, cranial nerves grossly intact. moves all 4 extremities w/o difficulty. Affect pleasant   ASSESSMENT & PLAN:  1. Acute on chronic diastolic CHF with RV failure - Echo 02/02/16 LVEF 60-65%, Grade 3 DD. Mild AI, Mild MR, RV mildly dilated and severely reduced. RA mildly dilated. PA peak pressure 74 mm Hg. Concerns for restrictive cardiomyopathy. - Echo 02/08/16 LVEF 55-60%, Restrictive physiology, (?)RV normal. No assessment of PA pressures. - Echo 7/19 EF 60-65% AVR ok  - TEE 6/21 EF 60% RV moderately HK septal flattening RVSP 90s - NYHA III-IIIb in setting of recurrent AF. Volume status OK but trending up. REDS 34%. - Start Jardiance 10 mg daily - Continue torsemide 40 mg qod  - Continue spiro 25 mg daily.   - Reinforced fluid restriction to < 2 L daily, sodium restriction to less than 2000 mg daily, and the importance of daily weights.   - May eventually need repeat RHC - BMET today  2. Paroxysmal Afib s/p AF ablation/Maze s/p pacemaker - TEE-DC-CV on  10/19/19 & DCCV on 02/25/20 - PM interrogated personally in clinic. After DC-CV was in NSR for about 24 hours and now back in persistent AF which he does not tolerate well  - Seems to be failing Tikosyn. - I have reached out to AF Clinic to schedule him f/u appointment to consider options (Dr. Rayann Heman knows him well) - Continue Eliquis 5 mg BID. No bleeding - CBC & Mag today - Follow up with Afib clinic next week to guide intervention (they will arrange follow up for next week)  3. AS s/p AVR - Stable bioprosthesis on TEE 6/21. No change to current plan - aware of need for SBE prophylaxis    4 Pulmonary HTN - suspect pulmonary venous hypertension - RHC in 10/17 with pulmonary venous HTN primarily. No change. - TEE 6/21 EF 60% RV moderately HK septal flattening RVSP 90s in setting of COPD flare and PAF.  5. Mild CAD by cath 01/2016 - Medical management. Continue atorvastatin 40 mg daily - No s/s of ischemia  6. Chronic respiratory failure with hypoxia - Continue with 2L home O2 - Using rescue inhaler daily, encouraged follows up with pulmonary.  - May benefit from [facility]   7. HTN - Blood pressure well controlled. Continue current regimen.  8. DM2 - Start Jardiance. Last Hgb A1c 6.6 (6/21)  9. Valvular disease - severe TR mod MR on TEE 6/21 - mild on recent TTE. - continue to follow  Total time spent 35 minutes. Over half that time spent discussing above.   Glori Bickers, MD  03/14/20     

## 2020-03-15 ENCOUNTER — Telehealth (HOSPITAL_COMMUNITY): Payer: Self-pay | Admitting: Pharmacy Technician

## 2020-03-15 LAB — CUP PACEART REMOTE DEVICE CHECK
Battery Remaining Longevity: 4 mo
Battery Remaining Percentage: 3 %
Battery Voltage: 2.65 V
Brady Statistic AP VP Percent: 49 %
Brady Statistic AP VS Percent: 1 %
Brady Statistic AS VP Percent: 50 %
Brady Statistic AS VS Percent: 1 %
Brady Statistic RA Percent Paced: 2.3 %
Brady Statistic RV Percent Paced: 99 %
Date Time Interrogation Session: 20211116105015
Implantable Lead Implant Date: 20131203
Implantable Lead Implant Date: 20131203
Implantable Lead Location: 753859
Implantable Lead Location: 753860
Implantable Lead Model: 1948
Implantable Pulse Generator Implant Date: 20131203
Lead Channel Impedance Value: 400 Ohm
Lead Channel Impedance Value: 610 Ohm
Lead Channel Pacing Threshold Amplitude: 0.75 V
Lead Channel Pacing Threshold Amplitude: 0.875 V
Lead Channel Pacing Threshold Pulse Width: 0.5 ms
Lead Channel Pacing Threshold Pulse Width: 0.8 ms
Lead Channel Sensing Intrinsic Amplitude: 10 mV
Lead Channel Sensing Intrinsic Amplitude: 3.3 mV
Lead Channel Setting Pacing Amplitude: 1 V
Lead Channel Setting Pacing Amplitude: 1.875
Lead Channel Setting Pacing Pulse Width: 0.8 ms
Lead Channel Setting Sensing Sensitivity: 4 mV
Pulse Gen Model: 2210
Pulse Gen Serial Number: 7408461

## 2020-03-15 NOTE — Progress Notes (Signed)
Remote pacemaker transmission.   

## 2020-03-15 NOTE — Addendum Note (Signed)
Addended by: Cheri Kearns A on: 03/15/2020 01:15 PM   Modules accepted: Level of Service

## 2020-03-15 NOTE — Telephone Encounter (Signed)
Patient was started on Jardiance, he is in the donut hole. His current 30 day co-pay is $146.71  Started and sent an application via fax to [facility].  Will follow up.

## 2020-03-16 ENCOUNTER — Encounter (HOSPITAL_COMMUNITY): Payer: Self-pay | Admitting: Nurse Practitioner

## 2020-03-16 ENCOUNTER — Other Ambulatory Visit: Payer: Self-pay

## 2020-03-16 ENCOUNTER — Ambulatory Visit (HOSPITAL_COMMUNITY)
Admission: RE | Admit: 2020-03-16 | Discharge: 2020-03-16 | Disposition: A | Discharge status: Home / Self Care | Payer: Medicare Other | Source: Ambulatory Visit | Attending: Nurse Practitioner | Admitting: Nurse Practitioner

## 2020-03-16 VITALS — BP 130/68 | HR 70 | Ht 71.0 in | Wt 198.2 lb

## 2020-03-16 DIAGNOSIS — J449 Chronic obstructive pulmonary disease, unspecified: Secondary | ICD-10-CM | POA: Diagnosis not present

## 2020-03-16 DIAGNOSIS — I4819 Other persistent atrial fibrillation: Secondary | ICD-10-CM | POA: Diagnosis present

## 2020-03-16 DIAGNOSIS — Z87891 Personal history of nicotine dependence: Secondary | ICD-10-CM | POA: Diagnosis not present

## 2020-03-16 DIAGNOSIS — Z79899 Other long term (current) drug therapy: Secondary | ICD-10-CM | POA: Diagnosis not present

## 2020-03-16 DIAGNOSIS — D6869 Other thrombophilia: Secondary | ICD-10-CM | POA: Diagnosis not present

## 2020-03-16 DIAGNOSIS — Z9981 Dependence on supplemental oxygen: Secondary | ICD-10-CM | POA: Diagnosis not present

## 2020-03-16 NOTE — Progress Notes (Signed)
Primary Care Physician: Velna Hatchet, MD Referring Physician  Dr. Rayann Heman  AHF: Dr. Virgina Jock is a 69 y.o. male with a h/o advanced COPD on home O2, with frequent flareup,A. fib on Tikosyn and systemic anticoagulation, IIDM. Patient presented to the ED on 6/19with increasing short of breath,wheezing, cough and poor oral intake for 3 days. Denies any fever chills.   Patient came to ED a day earlier for same acute issue and was discharged with p.o. steroid.His symptoms persisted and he was also not able to take his regular medications including Tikosyn. He returned back to ED on 6/19.   He had missed a couple of days of his Tikosyn as he felt nauseated and could not take his meds.He was in  afib.  EP saw pt in the hospital and restarted tikosyn He underwent successful cardioversion on 6/22. For his COPD he had po doxycycline in the hospital as well as IV steroids.   In the afib clinic , today, he is in [place] and feels improved. His breathing is also improved.  He is a sensed, v paced today. Being compliant with his meds.   F/u in afib clinic, 02/18/20. Per device clinic he has been in persistent  afib since 01/12/20. He was hospitalized 9/24 for COPD exacerbation. He was diuresed with lasix.  He continues on Tikosyn 250 mcg bid. He has not missed any  eliquis 5 mg bid for at the least 3 weeks. No Vaccines. CHA2DS2VASc score of at least 5. Wearing O2 via continuous [place].   F/u in afib clinic, 03/16/20. He saw Dr. Haroldine Laws 03/14/20 and was found to be back in afib.  Interrogation showed he had  SR for around 5 hours after CV, 10/29. C/o fatigue in afib,  he does not feel well out of rhythm. Ekg shows v paced at 70 bpm.   Today, he denies symptoms of palpitations, chest pain, shortness of breath, orthopnea, PND, lower extremity edema, dizziness, presyncope, syncope, or neurologic sequela. The patient is tolerating medications without difficulties and is otherwise without  complaint today.   Past Medical History:  Diagnosis Date  . Aortic stenosis, mild   . Arthritis of hand    "just a little bit in both hands" (03/31/2012)  . Asthma    "little bit" (03/31/2012)  . Atrial fibrillation [facility])    dx '04; DCCV '04, placed on flecainide, failed DCCV 04/2010, flecainide stopped; s/p successful A.fib ablation 01/31/12  . CHF (congestive heart failure) ([employer])   . Controlled type 2 diabetes mellitus without complication, without long-term current use of insulin ([place]) 12/13/2019  . COPD (chronic obstructive pulmonary disease) ([place])   . DJD (degenerative joint disease)   . Fibromyalgia   . First degree AV block    post ablation, ~468ms  . GERD (gastroesophageal reflux disease)   . Hyperlipidemia   . Hypertension   . Hypothyroidism    S/P radiation  . Left atrial enlargement    LA size 90mm by echo 11/21/11  . Mitral regurgitation    trivial  . Obstructive sleep apnea    mild by sleep study 2013; pt stated he does not have a machine because "it wasn't bad enough for him to have one"  . Pacemaker 03/31/2012  . Restless leg syndrome   . Second degree AV block   . Splenomegaly   . Visit for monitoring Tikosyn therapy 04/16/2016   Past Surgical History:  Procedure Laterality Date  . AORTIC VALVE REPLACEMENT N/A  08/22/2015   Procedure: AORTIC VALVE REPLACEMENT (AVR);  Surgeon: Ivin Poot, MD;  Location: [place];  Service: Open Heart Surgery;  Laterality: N/A;  . ATRIAL FIBRILLATION ABLATION  01/30/2012   PVI by Dr. Rayann Heman  . ATRIAL FIBRILLATION ABLATION N/A 01/31/2012   Procedure: ATRIAL FIBRILLATION ABLATION;  Surgeon: Thompson Grayer, MD;  Location: [facility] CATH LAB;  Service: Cardiovascular;  Laterality: N/A;  . BACK SURGERY     X 3  . CARDIAC CATHETERIZATION N/A 08/09/2015   Procedure: Right/Left Heart Cath and Coronary Angiography;  Surgeon: Peter M Martinique, MD;  Location: [place] CV LAB;  Service: Cardiovascular;  Laterality: N/A;  . CARDIAC CATHETERIZATION N/A  02/05/2016   Procedure: Right/Left Heart Cath and Coronary Angiography;  Surgeon: Jettie Booze, MD;  Location: [place] CV LAB;  Service: Cardiovascular;  Laterality: N/A;  . CARDIAC CATHETERIZATION N/A 04/17/2016   Procedure: Right Heart Cath;  Surgeon: Jolaine Artist, MD;  Location: [place] CV LAB;  Service: Cardiovascular;  Laterality: N/A;  . CARDIOVASCULAR STRESS TEST  03/12/2002   EF 48%, NO EVIDENCE OF ISCHEMIA  . CARDIOVERSION  01/2012; 03/31/2012  . CARDIOVERSION N/A 02/25/2012   Procedure: CARDIOVERSION;  Surgeon: Thompson Grayer, MD;  Location: [facility] CATH LAB;  Service: Cardiovascular;  Laterality: N/A;  . CARDIOVERSION N/A 03/31/2012   Procedure: CARDIOVERSION;  Surgeon: Thompson Grayer, MD;  Location: [facility] CATH LAB;  Service: Cardiovascular;  Laterality: N/A;  . CARDIOVERSION N/A 11/23/2015   Procedure: CARDIOVERSION;  Surgeon: Larey Dresser, MD;  Location: [place];  Service: Cardiovascular;  Laterality: N/A;  . CARDIOVERSION N/A 04/08/2016   Procedure: CARDIOVERSION;  Surgeon: Jolaine Artist, MD;  Location: [facility] ENDOSCOPY;  Service: Cardiovascular;  Laterality: N/A;  . CARDIOVERSION N/A 09/14/2018   Procedure: CARDIOVERSION;  Surgeon: Josue Hector, MD;  Location: [facility] ENDOSCOPY;  Service: Cardiovascular;  Laterality: N/A;  . CARDIOVERSION N/A 10/19/2019   Procedure: CARDIOVERSION;  Surgeon: Jolaine Artist, MD;  Location: [facility] ENDOSCOPY;  Service: Cardiovascular;  Laterality: N/A;  . CARDIOVERSION N/A 02/25/2020   Procedure: CARDIOVERSION;  Surgeon: Fay Records, MD;  Location: [place];  Service: Cardiovascular;  Laterality: N/A;  . CLIPPING OF ATRIAL APPENDAGE N/A 08/22/2015   Procedure: CLIPPING OF ATRIAL APPENDAGE;  Surgeon: Ivin Poot, MD;  Location: [place];  Service: Open Heart Surgery;  Laterality: N/A;  . DOPPLER ECHOCARDIOGRAPHY  03/11/2002   EF 70-75%  . FINGER SURGERY Left    Middle finger  . FINGER TENDON REPAIR  1980's   "right little  finger" (03/31/2012)  . INSERT / REPLACE / REMOVE PACEMAKER     [employer]  . IR RADIOLOGIST EVAL & MGMT  05/06/2019  . IR RADIOLOGIST EVAL & MGMT  10/06/2019  . LEFT AND RIGHT HEART CATHETERIZATION WITH CORONARY ANGIOGRAM N/A 10/27/2012   Procedure: LEFT AND RIGHT HEART CATHETERIZATION WITH CORONARY ANGIOGRAM;  Surgeon: Laverda Page, MD;  Location: [facility] CATH LAB;  Service: Cardiovascular;  Laterality: N/A;  . LUMBAR DISC SURGERY  1980's X2;  2000's  . MAZE N/A 08/22/2015   Procedure: MAZE;  Surgeon: Ivin Poot, MD;  Location: [place];  Service: Open Heart Surgery;  Laterality: N/A;  . PACEMAKER INSERTION  03/31/2012   STJ Accent DR pacemaker implanted by Dr Rayann Heman  . PERMANENT PACEMAKER INSERTION N/A 03/31/2012   Procedure: PERMANENT PACEMAKER INSERTION;  Surgeon: Thompson Grayer, MD;  Location: [facility] CATH LAB;  Service: Cardiovascular;  Laterality: N/A;  . TEE WITHOUT CARDIOVERSION  01/30/2012  Procedure: TRANSESOPHAGEAL ECHOCARDIOGRAM (TEE);  Surgeon: Larey Dresser, MD;  Location: [place];  Service: Cardiovascular;  Laterality: N/A;  ablation next day  . TEE WITHOUT CARDIOVERSION N/A 08/22/2015   Procedure: TRANSESOPHAGEAL ECHOCARDIOGRAM (TEE);  Surgeon: Ivin Poot, MD;  Location: [place];  Service: Open Heart Surgery;  Laterality: N/A;  . TEE WITHOUT CARDIOVERSION N/A 10/19/2019   Procedure: TRANSESOPHAGEAL ECHOCARDIOGRAM (TEE);  Surgeon: Jolaine Artist, MD;  Location: [facility] ENDOSCOPY;  Service: Cardiovascular;  Laterality: N/A;  . US ECHOCARDIOGRAPHY  08/07/2009   EF 55-60%    Current Outpatient Medications  Medication Sig Dispense Refill  . albuterol (PROVENTIL HFA;VENTOLIN HFA) 108 (90 Base) MCG/ACT inhaler Inhale 2 puffs into the lungs every 6 (six) hours as needed for wheezing or shortness of breath. 1 Inhaler 5  . amLODipine (NORVASC) 10 MG tablet Take 0.5 tablets (5 mg total) by mouth daily. 30 tablet 11  . apixaban (ELIQUIS) 5 MG TABS tablet Take 5 mg by mouth 2 (two) times daily.     . bisoprolol (ZEBETA) 5 MG tablet Take 5 mg by mouth daily.    . Budeson-Glycopyrrol-Formoterol (BREZTRI AEROSPHERE) 160-9-4.8 MCG/ACT AERO Inhale 2 puffs into the lungs 2 (two) times daily. 4 g 0  . Cholecalciferol (VITAMIN D) 2000 UNITS CAPS Take 8,000 Units by mouth at bedtime.     . dofetilide (TIKOSYN) 250 MCG capsule Take 1 capsule (250 mcg total) by mouth 2 (two) times daily. 180 capsule 1  . empagliflozin (JARDIANCE) 10 MG TABS tablet Take 1 tablet (10 mg total) by mouth daily before breakfast. 30 tablet 6  . fexofenadine (ALLEGRA) 180 MG tablet Take 1 tablet (180 mg total) by mouth daily as needed for allergies or rhinitis. (Patient taking differently: Take 180 mg by mouth as needed for allergies or rhinitis. )    . gabapentin (NEURONTIN) 300 MG capsule Take 600-1,200 mg by mouth See admin instructions. Take 600 mg by mouth in the morning, 600 mg at lunchtime, and take 1200 mg at bedtime  3  . HYDROcodone-acetaminophen (NORCO/VICODIN) 5-325 MG tablet Take 1 tablet by mouth 2 (two) times daily as needed (pain).     [name] levothyroxine (SYNTHROID, LEVOTHROID) 175 MCG tablet Take 175 mcg by mouth daily before breakfast.    . magnesium oxide (MAG-OX) 400 MG tablet Take 400 mg by mouth daily.    . Multiple Vitamin (MULTIVITAMIN WITH MINERALS) TABS tablet Take 1 tablet by mouth daily. 30 tablet 0  . ondansetron (ZOFRAN-ODT) 4 MG disintegrating tablet Take 4 mg by mouth every 8 (eight) hours as needed for nausea or vomiting.    . OXYGEN Inhale 2 L into the lungs continuous.    . pantoprazole (PROTONIX) 40 MG tablet Take 40 mg by mouth daily.    . polyethylene glycol powder (GLYCOLAX/MIRALAX) 17 GM/SCOOP powder Take 17 g by mouth daily as needed for mild constipation.    . Polyvinyl Alcohol-Povidone (REFRESH OP) Place 1 drop into both eyes daily as needed (dry eyes).    . potassium chloride (KLOR-CON) 10 MEQ tablet Take 20 mEq by mouth See admin instructions. Take 20 meq twice every other day (take  only on torsemide days)    . predniSONE (DELTASONE) 5 MG tablet Take 1 tablet (5 mg total) by mouth daily with breakfast. 30 tablet 3  . protein supplement shake (PREMIER PROTEIN) LIQD Take 11 oz by mouth daily.    [name] rOPINIRole (REQUIP) 1 MG tablet Take 1 tablet (1 mg total) by mouth in the  morning and at bedtime.    . rosuvastatin (CRESTOR) 10 MG tablet Take 10 mg by mouth at bedtime.     [name] spironolactone (ALDACTONE) 25 MG tablet Take 1 tablet (25 mg total) by mouth daily. 90 tablet 3  . torsemide (DEMADEX) 20 MG tablet Take 40 mg by mouth every other day.    . traMADol-acetaminophen (ULTRACET) 37.5-325 MG tablet Take 2 tablets by mouth 2 (two) times daily.     No current facility-administered medications for this encounter.    Allergies  Allergen Reactions  . Meloxicam Rash  . 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 syndrome 09/02/15, resolved with diphenhydramine and slowing of rate    Social History   Socioeconomic History  . Marital status: Married    Spouse name: Not on file  . Number of children: Not on file  . Years of education: Not on file  . Highest education level: Not on file  Occupational History  . Occupation: retired  Tobacco Use  . Smoking status: Former Smoker    Packs/day: 1.00    Years: 25.00    Pack years: 25.00    Types: Cigarettes    Quit date: 07/18/1991    Years since quitting: 28.6  . Smokeless tobacco: Never Used  Vaping Use  . Vaping Use: Never used  Substance and Sexual Activity  . Alcohol use: Not Currently    Comment: occasional  . Drug use: Yes    Types: Marijuana    Comment: rarely use  . Sexual activity: Not Currently  Other Topics Concern  . Not on file  Social History Narrative   Lives in [place] with spouse.  Works as a Air traffic controller.   Social Determinants of Health   Financial Resource Strain:   . Difficulty of Paying Living Expenses: Not on file  Food Insecurity:   . Worried About Charity fundraiser in the  Last Year: Not on file  . Ran Out of Food in the Last Year: Not on file  Transportation Needs:   . Lack of Transportation (Medical): Not on file  . Lack of Transportation (Non-Medical): Not on file  Physical Activity:   . Days of Exercise per Week: Not on file  . Minutes of Exercise per Session: Not on file  Stress:   . Feeling of Stress : Not on file  Social Connections:   . Frequency of Communication with Friends and Family: Not on file  . Frequency of Social Gatherings with Friends and Family: Not on file  . Attends Religious Services: Not on file  . Active Member of Clubs or Organizations: Not on file  . Attends Archivist Meetings: Not on file  . Marital Status: Not on file  Intimate Partner Violence:   . Fear of Current or Ex-Partner: Not on file  . Emotionally Abused: Not on file  . Physically Abused: Not on file  . Sexually Abused: Not on file    Family History  Problem Relation Age of Onset  . Heart failure Mother   . Stroke Father     ROS- All systems are reviewed and negative except as per the HPI above  Physical Exam: Vitals:   03/16/20 1031  BP: 130/68  Pulse: 70  Weight: 89.9 kg  Height: 5\' 11"  (1.803 m)   Wt Readings from Last 3 Encounters:  03/16/20 89.9 kg  03/14/20 90.7 kg  02/25/20 87.1 kg    Labs: Lab Results  Component Value Date  NA 140 03/14/2020   K 4.6 03/14/2020   CL 101 03/14/2020   CO2 28 03/14/2020   GLUCOSE 123 (H) 03/14/2020   BUN 23 03/14/2020   CREATININE 1.82 (H) 03/14/2020   CALCIUM 10.0 03/14/2020   PHOS 4.3 12/14/2019   MG 2.3 03/14/2020   Lab Results  Component Value Date   INR 1.6 (H) 10/18/2019   Lab Results  Component Value Date   CHOL 114 (L) 08/03/2015   HDL 30 (L) 08/03/2015   LDLCALC 62 08/03/2015   TRIG 111 08/03/2015     GEN- The patient is well appearing, alert and oriented x 3 today.   Head- normocephalic, atraumatic Eyes-  Sclera clear, conjunctiva pink Ears- hearing  intact Oropharynx- clear Neck- supple, no JVP Lymph- no cervical lymphadenopathy Lungs- Clear to ausculation bilaterally, normal work of breathing Heart- irregular rate and rhythm, no murmurs, rubs or gallops, PMI not laterally displaced GI- soft, NT, ND, + BS Extremities- no clubbing, cyanosis, or edema MS- no significant deformity or atrophy Skin- no rash or lesion Psych- euthymic mood, full affect Neuro- strength and sensation are intact  EKG-v paced rhythm at 70 bpm, qrs int 108 ms, qtc 410 ms  Merlin transmission received- 10/19- Ongoing AF since 01/12/20.  Current AF burden 97%.  Pt has history of persistent AF occurring with COPD exacerbation, requiring DCCV to convert.  Pt hospitalized 9/24 for COPD exacerbation.  Current medications include Tikosyn 250 mcg BID, Bisoprolol 5 mg daily, Eliquis 5 mg BID.  Cardioversion 10/29 with ERAF, lasting only a few hours after CV  Spoke with pt, he was not aware of being in AF, denies any current symptoms.  Pt confirmed compliance with medications as ordered.    Assessment and Plan: 1. Persistent  afib Recent breakthrough with COPD exacerbation in September and persists to present Cardioversion 10/29 with ERAF  Discussed options with pt, he appears that he is failing tikosyn to maintain SR With his significant lung disease, I do ot think amiodarone wioud be a good oprion He  has had an ablation in 2013 and a Maze in 2017 Last  Echo,  in August , with LA size of 4.70 and a volume of 60. I discussed with Dr. Rayann Heman and he is agreeable  to discuss with pt  repeat ablation   May need pulmonary input for his ability to tolerate general anaesthesia  Continue tikosyn at 250 mcg bid  Continue bisoprolol at 5 mg bid  No covid vaccines   2. CHA2DS2VASc score of at least 4 Continue eliquis 5 mg bid   3.COPD Improved since d/c 9/26 for COPD exacerabation   4. HF Weight stable   Appointment with Dr. Rayann Heman  requested   Geroge Baseman. [name],  [facility] [address] [place], [place] [address] [phone]

## 2020-03-22 NOTE — Telephone Encounter (Signed)
Advanced Heart Failure Patient Advocate Encounter   Patient was approved to receive Jardiance from [facility].  Patient MD:[id number]   Effective dates: 03/22/20 through 04/28/20.  The medication will be mailed out on Monday 11/29.  Called and left patient a message regarding approval.  Charlann Boxer, CPhT

## 2020-03-22 NOTE — Progress Notes (Signed)
@Patient  ID: [name], male    DOB: 02/14/1951, 69 y.o.   MRN: [id number]  Chief Complaint  Patient presents with  . Follow-up    Pt is here today for a surgical clearance visit due to a possible ablation that might need to happen. Also pt has had increased SOB, some coughing, and wheezing x1 week. Pt has been having to do at least 2 breathing treatments to help with symptoms.    Referring provider: Velna Hatchet, MD  HPI: 68 year old male, former smoker quit in 1993 (25 pack year hx). 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 Patient of Dr. Lamonte Sakai.  Maintained on Breztri and supplemental oxygen as needed.    Previous [facility] pulmonary encounters: 11/05/2019 Patient admitted to [facility] on October 16, 2019 for COPD exacerbation.  Patient reported increased shortness of breath, wheezing, cough and pleural oral intake for 3 days.  He was discharged from the emergency room with a course of oral steroids and represented to ED the following day with persistent symptoms.  WBC elevated at 17.3.  Chest x-ray showed chronic increased lung markings without evidence of acute cardiopulmonary disease.  CT angio did not show any pulmonary embolism, infiltrate or effusion.  He had marked severity coronary artery calcification and a stable 5 mm right lung nodule.  He was treated with IV steroids and 5-day course of oral doxycycline.  He was discharged on gradual oral prednisone taper.  It was recommended that he stay on 5 mg daily after completing this.  Patient presents today for hospital follow-up.  He has not been seen in our office in over 3 years.  He was recently hospitalized in June for COPD exacerbation. He is currently on Dulera two puffs twice daily and prednisone  5mg  daily. He is unable to afford [facility], was told he could get samples in our office which we no longer receive. States that his breathing is a lot better. He has a chronic productive cough with beige mucus. He uses albuterol rescue 3-4 times a week when weather is bad. He was seen inpatient on June 20th by Dr. Melvyn Novas who recommended he be tried on Breztri d/t group D copd symptoms. Denies fever, chest tightness or wheezing.   03/24/2020- Interim hx  Patient presents today for surgical clearance. During last visit he was started on Breztri two puffs twice daily, started paper work for patient assistance. Wife feels his breathing has been worse the last two weeks d/t weather. He ran out of [place] several weeks ago around the same time his breathing exacerbated. He has been approved for patient assistance, awaiting shipment of medication. He is currently using Dulera twice a day. He is on chronic 5mg  prednisone daily. He has been using Albuterol nebulizer once a day. He has a slight productive cough with beige mucus. He is on baseline 2L oxygen, no new requirments. Denies f/c/s, chest tightness, wheezing, N/V/D.    He has a virtual visit with cardiologist on Monday 03/27/20 for consultation cardiac ablation. He has been in Afib since September. He had a cardioversion in October that did not work.   Allergies  Allergen Reactions  . Meloxicam Rash  . 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 syndrome 09/02/15, resolved with diphenhydramine and slowing of rate    Immunization History  Administered Date(s) Administered  .  Influenza Split 01/27/2013, 02/28/2015  . Influenza,inj,Quad PF,6+ Mos 01/30/2016  . Influenza-Unspecified 01/30/2012, 01/27/2014    Past Medical History:  Diagnosis Date  . Aortic stenosis, mild   . Arthritis of hand    "just a little bit in both hands" (03/31/2012)  . Asthma    "little bit" (03/31/2012)  . Atrial fibrillation [facility])    dx '04; DCCV '04, placed on flecainide,  failed DCCV 04/2010, flecainide stopped; s/p successful A.fib ablation 01/31/12  . CHF (congestive heart failure) ([place])   . Controlled type 2 diabetes mellitus without complication, without long-term current use of insulin ([place]) 12/13/2019  . COPD (chronic obstructive pulmonary disease) ([place])   . DJD (degenerative joint disease)   . Fibromyalgia   . First degree AV block    post ablation, ~492ms  . GERD (gastroesophageal reflux disease)   . Hyperlipidemia   . Hypertension   . Hypothyroidism    S/P radiation  . Left atrial enlargement    LA size 67mm by echo 11/21/11  . Mitral regurgitation    trivial  . Obstructive sleep apnea    mild by sleep study 2013; pt stated he does not have a machine because "it wasn't bad enough for him to have one"  . Pacemaker 03/31/2012  . Restless leg syndrome   . Second degree AV block   . Splenomegaly   . Visit for monitoring Tikosyn therapy 04/16/2016    Tobacco History: Social History   Tobacco Use  Smoking Status Former Smoker  . Packs/day: 1.00  . Years: 25.00  . Pack years: 25.00  . Types: Cigarettes  . Quit date: 07/18/1991  . Years since quitting: 28.7  Smokeless Tobacco Never Used   Counseling given: Not Answered   Outpatient Medications Prior to Visit  Medication Sig Dispense Refill  . albuterol (PROVENTIL HFA;VENTOLIN HFA) 108 (90 Base) MCG/ACT inhaler Inhale 2 puffs into the lungs every 6 (six) hours as needed for wheezing or shortness of breath. 1 Inhaler 5  . amLODipine (NORVASC) 10 MG tablet Take 0.5 tablets (5 mg total) by mouth daily. 30 tablet 11  . apixaban (ELIQUIS) 5 MG TABS tablet Take 5 mg by mouth 2 (two) times daily.    . bisoprolol (ZEBETA) 5 MG tablet Take 5 mg by mouth daily.    . Cholecalciferol (VITAMIN D) 2000 UNITS CAPS Take 8,000 Units by mouth at bedtime.     . dofetilide (TIKOSYN) 250 MCG capsule Take 1 capsule (250 mcg total) by mouth 2 (two) times daily. 180 capsule 1  . empagliflozin (JARDIANCE) 10 MG  TABS tablet Take 1 tablet (10 mg total) by mouth daily before breakfast. 30 tablet 6  . fexofenadine (ALLEGRA) 180 MG tablet Take 1 tablet (180 mg total) by mouth daily as needed for allergies or rhinitis.    [name] gabapentin (NEURONTIN) 300 MG capsule Take 600-1,200 mg by mouth See admin instructions. Take 600 mg by mouth in the morning, 600 mg at lunchtime, and take 1200 mg at bedtime  3  . HYDROcodone-acetaminophen (NORCO/VICODIN) 5-325 MG tablet Take 1 tablet by mouth 2 (two) times daily as needed (pain).     [name] levothyroxine (SYNTHROID, LEVOTHROID) 175 MCG tablet Take 175 mcg by mouth daily before breakfast.    . magnesium oxide (MAG-OX) 400 MG tablet Take 400 mg by mouth daily.    . Multiple Vitamin (MULTIVITAMIN WITH MINERALS) TABS tablet Take 1 tablet by mouth daily. 30 tablet 0  . ondansetron (ZOFRAN-ODT) 4 MG disintegrating tablet Take  4 mg by mouth every 8 (eight) hours as needed for nausea or vomiting.    . OXYGEN Inhale 2 L into the lungs continuous.    . pantoprazole (PROTONIX) 40 MG tablet Take 40 mg by mouth daily.    . polyethylene glycol powder (GLYCOLAX/MIRALAX) 17 GM/SCOOP powder Take 17 g by mouth daily as needed for mild constipation.    . Polyvinyl Alcohol-Povidone (REFRESH OP) Place 1 drop into both eyes daily as needed (dry eyes).    . potassium chloride (KLOR-CON) 10 MEQ tablet Take 20 mEq by mouth See admin instructions. Take 20 meq twice every other day (take only on torsemide days)    . predniSONE (DELTASONE) 5 MG tablet Take 1 tablet (5 mg total) by mouth daily with breakfast. 30 tablet 3  . protein supplement shake (PREMIER PROTEIN) LIQD Take 11 oz by mouth daily.    [name] rOPINIRole (REQUIP) 1 MG tablet Take 1 tablet (1 mg total) by mouth in the morning and at bedtime.    . rosuvastatin (CRESTOR) 10 MG tablet Take 10 mg by mouth at bedtime.     [name] spironolactone (ALDACTONE) 25 MG tablet Take 1 tablet (25 mg total) by mouth daily. 90 tablet 3  . torsemide (DEMADEX) 20 MG tablet  Take 40 mg by mouth every other day.    . traMADol-acetaminophen (ULTRACET) 37.5-325 MG tablet Take 2 tablets by mouth 2 (two) times daily.    . Budeson-Glycopyrrol-Formoterol (BREZTRI AEROSPHERE) 160-9-4.8 MCG/ACT AERO Inhale 2 puffs into the lungs 2 (two) times daily. 4 g 0   No facility-administered medications prior to visit.   Review of Systems  Review of Systems  Constitutional: Negative.   HENT: Negative.   Respiratory: Positive for shortness of breath. Negative for chest tightness and wheezing.        Chronic cough  Cardiovascular: Negative.    Physical Exam  BP 124/64 (BP Location: Left Arm, Cuff Size: Normal)   Pulse 71   Ht 5\' 11"  (1.803 m)   Wt 199 lb 3.2 oz (90.4 kg)   SpO2 98%   BMI 27.78 kg/m  Physical Exam Constitutional:      Appearance: Normal appearance.  HENT:     Head: Normocephalic.     Mouth/Throat:     Mouth: Mucous membranes are moist.     Pharynx: Oropharynx is clear.  Cardiovascular:     Rate and Rhythm: Normal rate and regular rhythm.     Comments: No edema or JVD Pulmonary:     Effort: Pulmonary effort is normal.     Breath sounds: Normal breath sounds.     Comments: CTA Musculoskeletal:        General: Normal range of motion.     Cervical back: Normal range of motion and neck supple.  Skin:    General: Skin is warm and dry.  Neurological:     General: No focal deficit present.     Mental Status: He is alert and oriented to person, place, and time. Mental status is at baseline.  Psychiatric:        Mood and Affect: Mood normal.        Behavior: Behavior normal.        Thought Content: Thought content normal.        Judgment: Judgment normal.      Lab Results:  CBC    Component Value Date/Time   WBC 9.3 03/14/2020 1142   RBC 4.15 (L) 03/14/2020 1142   HGB 12.5 (L) 03/14/2020  1142   HGB 13.4 01/10/2020 1208   HCT 39.8 03/14/2020 1142   HCT 40.8 01/10/2020 1208   PLT 170 03/14/2020 1142   PLT 170 01/10/2020 1208   MCV 95.9  03/14/2020 1142   MCV 94 01/10/2020 1208   MCH 30.1 03/14/2020 1142   MCHC 31.4 03/14/2020 1142   RDW 14.6 03/14/2020 1142   RDW 13.8 01/10/2020 1208   LYMPHSABS 1.1 01/10/2020 1208   MONOABS 0.3 12/14/2019 0339   EOSABS 0.2 01/10/2020 1208   BASOSABS 0.0 01/10/2020 1208    BMET    Component Value Date/Time   NA 140 03/14/2020 1142   NA 133 (L) 01/10/2020 1208   K 4.6 03/14/2020 1142   CL 101 03/14/2020 1142   CO2 28 03/14/2020 1142   GLUCOSE 123 (H) 03/14/2020 1142   BUN 23 03/14/2020 1142   BUN 19 01/10/2020 1208   CREATININE 1.82 (H) 03/14/2020 1142   CREATININE 1.65 (H) 01/29/2016 1543   CALCIUM 10.0 03/14/2020 1142   GFRNONAA 39 (L) 03/14/2020 1142   GFRAA 51 (L) 01/23/2020 0430    BNP    Component Value Date/Time   BNP 271.6 (H) 01/22/2020 0054   BNP 406.6 (H) 01/29/2016 1543    ProBNP    Component Value Date/Time   PROBNP 470.0 (H) 01/15/2016 1708    Imaging: CUP PACEART REMOTE DEVICE CHECK  Result Date: 03/15/2020 Scheduled monthly remote reviewed. Normal device function. Battery voltage 2.65V, Est. longevity 4.1 months AF burden 97%, persistent since 01/12/20, rates controlled. On apixaban and tikosyn. No new HVR events Next remote in 30 days.    Assessment & Plan:   COPD (chronic obstructive pulmonary disease) (HCC) - COPD/asthma overlap, group D based on clinical symptoms. Recently started on Breztri, ran out of medication several weeks ago while awaiting patient assistance approval. He has been using Dulera 2 puffs twice daily. Reports increased shortness of breath being off TRIPLE THERAPY. This is likely d.t running out of BREZTRI inhaler and not a true exacerbations of his COPD. Will will give him a sample today to hold him over until get gets medication which has been approved. Advised patient to increased prednisoen to 10mg  x 1 week then resume chronic 5mg  prednisone daily. FU in 3 months.   Pre-operative respiratory examination - Patient will  be meeting with cardiology for virtual visit on 03/27/20 to discuss cardiac ablation. Not date set as of now. He is considered a low to intermediate risk for post-op pulmonary complications based on age, hx COPD and chronic respiratory failure on 2L oxygen. He has some increased shortness of breath today but he ran out of his ICS/LABA/LAMA inhaler several weeks ago, sample given and patient has been approved for assistance. No clear signs of an AECOPD. No new oxygen requirements. Continue bronchodilators as prescribed. Encourage early ambulation, IS and DVT prophylaxis.  Major Pulmonary risks identified in the multifactorial risk analysis are but not limited to a) pneumonia; b) recurrent intubation risk; c) prolonged or recurrent acute respiratory failure needing mechanical ventilation; d) prolonged hospitalization; e) DVT/Pulmonary embolism; f) Acute Pulmonary edema  Recommend 1. Short duration of surgery as much as possible and avoid paralytic if possible 2. Recovery in step down or ICU with Pulmonary consultation if needed  3. DVT prophylaxis 4. Aggressive pulmonary toilet with o2, bronchodilatation, and incentive spirometry and early ambulation       Chronic respiratory failure with hypoxia (HCC) - Continue Oxygen 2L continuously, no new requirements  1) RISK FOR PROLONGED MECHANICAL VENTILAION - > 48h  1A) Arozullah - Prolonged mech ventilation risk Arozullah Postperative Pulmonary Risk Score - for mech ventilation dependence >48h [employer], Ann Surg 2000, major non-cardiac surgery) Comment Score  Type of surgery - abd ao aneurysm (27), thoracic (21), neurosurgery / upper abdominal / vascular (21), neck (11) Ablation  3  Emergency Surgery - (11)  0  ALbumin < 3 or poor nutritional state - (9)  0  BUN > 30 -  (8)  0  Partial or completely dependent functional status - (7)  0  COPD -  (6)  6  Age - 60 to 69 (4), > 70  (6)  6  TOTAL  15  Risk Stratifcation scores  - < 10  (0.5%), 11-19 (1.8%), 20-27 (4.2%), 28-40 (10.1%), >40 (26.6%)  1.8%    1B) GUPTA - Prolonged Mech Vent Risk Score source Risk  Guptal post op prolonged mech ventilation > 48h or reintubation < 30 days - ACS 2007-2008 dataset - http://lewis-perez.info/ 3.1 % Risk of mechanical ventilation for >48 hrs after surgery, or unplanned intubation ?30 days of surgery     2) RISK FOR POST OP PNEUMONIA Score source Risk  Lyndel Safe - Post Op Pnemounia risk  [email or web address] 2.0 % Risk of postoperative pneumonia     R3) ISK FOR ANY POST-OP PULMONARY COMPLICATION Score source Risk  CANET/ARISCAT Score - risk for ANY/ALl pulmonary complications - > risk of in-hospital post-op pulmonary complications (composite including respiratory failure, respiratory infection, pleural effusion, atelectasis, pneumothorax, bronchospasm, aspiration pneumonitis) [employer] - based on age, anemia, pulse ox, resp infection prior 30d, incision site, duration of surgery, and emergency v elective surgery Low risk 1.6% risk of in-hospital post-op pulmonary complications (composite including respiratory failure, respiratory infection, pleural effusion, atelectasis, pneumothorax, bronchospasm, aspiration pneumonitis)     Martyn Ehrich, NP 03/27/2020

## 2020-03-24 ENCOUNTER — Encounter: Payer: Self-pay | Admitting: Primary Care

## 2020-03-24 ENCOUNTER — Ambulatory Visit: Payer: Medicare Other | Admitting: Primary Care

## 2020-03-24 ENCOUNTER — Other Ambulatory Visit: Payer: Self-pay

## 2020-03-24 DIAGNOSIS — J449 Chronic obstructive pulmonary disease, unspecified: Secondary | ICD-10-CM | POA: Diagnosis not present

## 2020-03-24 DIAGNOSIS — Z01811 Encounter for preprocedural respiratory examination: Secondary | ICD-10-CM

## 2020-03-24 DIAGNOSIS — J9611 Chronic respiratory failure with hypoxia: Secondary | ICD-10-CM

## 2020-03-24 MED ORDER — PREDNISONE 10 MG PO TABS: ORAL_TABLET | ORAL | 0 refills | Status: DC

## 2020-03-24 MED ORDER — BREZTRI AEROSPHERE 160-9-4.8 MCG/ACT IN AERO: 2.0000 | INHALATION_SPRAY | Freq: Two times a day (BID) | RESPIRATORY_TRACT | 0 refills | Status: DC

## 2020-03-24 NOTE — Patient Instructions (Addendum)
You are low risk for post-op pulmonary compliacations following surgery  Recommend getting out of bed as soon as possible after surgery, using incentive spirometer every hour and wearing compression stockings  COPD: - Resume Breztri two puff twice daily - Take prednisone 10mg  x 5 days then resume 5mg  daily - If you develop increase shortness of breath, chest tightness or purulent cough please notify office before surgery   Chronic respiratory failure: - Continue Oxygen 2L continuously  Follow-up: - 3-4 months with Dr. Lamonte Sakai or Eustaquio Maize NP

## 2020-03-27 ENCOUNTER — Telehealth (INDEPENDENT_AMBULATORY_CARE_PROVIDER_SITE_OTHER): Payer: Medicare Other | Admitting: Internal Medicine

## 2020-03-27 ENCOUNTER — Encounter: Payer: Self-pay | Admitting: Internal Medicine

## 2020-03-27 ENCOUNTER — Encounter: Payer: Self-pay | Admitting: *Deleted

## 2020-03-27 ENCOUNTER — Telehealth: Payer: Self-pay | Admitting: *Deleted

## 2020-03-27 ENCOUNTER — Other Ambulatory Visit: Payer: Self-pay

## 2020-03-27 VITALS — Ht 71.0 in | Wt 198.0 lb

## 2020-03-27 DIAGNOSIS — I4819 Other persistent atrial fibrillation: Secondary | ICD-10-CM | POA: Diagnosis not present

## 2020-03-27 DIAGNOSIS — Z01811 Encounter for preprocedural respiratory examination: Secondary | ICD-10-CM | POA: Insufficient documentation

## 2020-03-27 DIAGNOSIS — I5032 Chronic diastolic (congestive) heart failure: Secondary | ICD-10-CM | POA: Diagnosis not present

## 2020-03-27 DIAGNOSIS — I4891 Unspecified atrial fibrillation: Secondary | ICD-10-CM

## 2020-03-27 DIAGNOSIS — D6869 Other thrombophilia: Secondary | ICD-10-CM

## 2020-03-27 NOTE — H&P (View-Only) (Signed)
Electrophysiology TeleHealth Note  Due to national recommendations of social distancing due to [place] 19, an audio telehealth visit is felt to be most appropriate for this patient at this time.  Verbal consent was obtained by me for the telehealth visit today.  The patient does not have capability for a virtual visit.  A phone visit is therefore required today.   Date:  03/27/2020   ID:  [name], DOB 02-12-1951, MRN [id number]  Location: patient's home  Provider location:  [place]  Evaluation Performed: Follow-up visit  PCP:  Velna Hatchet, MD   Electrophysiologist:  Dr Rayann Heman  Chief Complaint:  palpitations  History of Present Illness:    [name] is a 69 y.o. male who presents via telehealth conferencing today.  Since last being seen in our clinic, the patient reports doing reasonably well.  His afib has returned.  He reports symptoms of fatigue and SOB.  He feels "Exhausted".  He is on [place].  He recently failed cardioversion.  Today, he denies symptoms of palpitations, chest pain, shortness of breath,  lower extremity edema, dizziness, presyncope, or syncope.  The patient is otherwise without complaint today.     Past Medical History:  Diagnosis Date  . Aortic stenosis, mild   . Arthritis of hand    "just a little bit in both hands" (03/31/2012)  . Asthma    "little bit" (03/31/2012)  . Atrial fibrillation [facility])    dx '04; DCCV '04, placed on flecainide, failed DCCV 04/2010, flecainide stopped; s/p successful A.fib ablation 01/31/12  . CHF (congestive heart failure) ([place])   . Controlled type 2 diabetes mellitus without complication, without long-term current use of insulin ([place]) 12/13/2019  . COPD (chronic obstructive pulmonary disease) ([place])   . DJD (degenerative joint disease)   . Fibromyalgia   . First degree AV block    post ablation, ~442ms  . GERD (gastroesophageal reflux disease)   . Hyperlipidemia   . Hypertension   . Hypothyroidism     S/P radiation  . Left atrial enlargement    LA size 76mm by echo 11/21/11  . Mitral regurgitation    trivial  . Obstructive sleep apnea    mild by sleep study 2013; pt stated he does not have a machine because "it wasn't bad enough for him to have one"  . Pacemaker 03/31/2012  . Restless leg syndrome   . Second degree AV block   . Splenomegaly   . Visit for monitoring Tikosyn therapy 04/16/2016    Past Surgical History:  Procedure Laterality Date  . AORTIC VALVE REPLACEMENT N/A 08/22/2015   Procedure: AORTIC VALVE REPLACEMENT (AVR);  Surgeon: Ivin Poot, MD;  Location: [place];  Service: Open Heart Surgery;  Laterality: N/A;  . ATRIAL FIBRILLATION ABLATION  01/30/2012   PVI by Dr. Rayann Heman  . ATRIAL FIBRILLATION ABLATION N/A 01/31/2012   Procedure: ATRIAL FIBRILLATION ABLATION;  Surgeon: Thompson Grayer, MD;  Location: [facility] CATH LAB;  Service: Cardiovascular;  Laterality: N/A;  . BACK SURGERY     X 3  . CARDIAC CATHETERIZATION N/A 08/09/2015   Procedure: Right/Left Heart Cath and Coronary Angiography;  Surgeon: Peter M Martinique, MD;  Location: [place] CV LAB;  Service: Cardiovascular;  Laterality: N/A;  . CARDIAC CATHETERIZATION N/A 02/05/2016   Procedure: Right/Left Heart Cath and Coronary Angiography;  Surgeon: Jettie Booze, MD;  Location: [place] CV LAB;  Service: Cardiovascular;  Laterality: N/A;  . CARDIAC CATHETERIZATION N/A 04/17/2016  Procedure: Right Heart Cath;  Surgeon: Jolaine Artist, MD;  Location: [place] CV LAB;  Service: Cardiovascular;  Laterality: N/A;  . CARDIOVASCULAR STRESS TEST  03/12/2002   EF 48%, NO EVIDENCE OF ISCHEMIA  . CARDIOVERSION  01/2012; 03/31/2012  . CARDIOVERSION N/A 02/25/2012   Procedure: CARDIOVERSION;  Surgeon: Thompson Grayer, MD;  Location: [facility] CATH LAB;  Service: Cardiovascular;  Laterality: N/A;  . CARDIOVERSION N/A 03/31/2012   Procedure: CARDIOVERSION;  Surgeon: Thompson Grayer, MD;  Location: [facility] CATH LAB;  Service: Cardiovascular;   Laterality: N/A;  . CARDIOVERSION N/A 11/23/2015   Procedure: CARDIOVERSION;  Surgeon: Larey Dresser, MD;  Location: [place];  Service: Cardiovascular;  Laterality: N/A;  . CARDIOVERSION N/A 04/08/2016   Procedure: CARDIOVERSION;  Surgeon: Jolaine Artist, MD;  Location: [facility] ENDOSCOPY;  Service: Cardiovascular;  Laterality: N/A;  . CARDIOVERSION N/A 09/14/2018   Procedure: CARDIOVERSION;  Surgeon: Josue Hector, MD;  Location: [facility] ENDOSCOPY;  Service: Cardiovascular;  Laterality: N/A;  . CARDIOVERSION N/A 10/19/2019   Procedure: CARDIOVERSION;  Surgeon: Jolaine Artist, MD;  Location: [facility] ENDOSCOPY;  Service: Cardiovascular;  Laterality: N/A;  . CARDIOVERSION N/A 02/25/2020   Procedure: CARDIOVERSION;  Surgeon: Fay Records, MD;  Location: [place];  Service: Cardiovascular;  Laterality: N/A;  . CLIPPING OF ATRIAL APPENDAGE N/A 08/22/2015   Procedure: CLIPPING OF ATRIAL APPENDAGE;  Surgeon: Ivin Poot, MD;  Location: [place];  Service: Open Heart Surgery;  Laterality: N/A;  . DOPPLER ECHOCARDIOGRAPHY  03/11/2002   EF 70-75%  . FINGER SURGERY Left    Middle finger  . FINGER TENDON REPAIR  1980's   "right little finger" (03/31/2012)  . INSERT / REPLACE / REMOVE PACEMAKER     [employer]  . IR RADIOLOGIST EVAL & MGMT  05/06/2019  . IR RADIOLOGIST EVAL & MGMT  10/06/2019  . LEFT AND RIGHT HEART CATHETERIZATION WITH CORONARY ANGIOGRAM N/A 10/27/2012   Procedure: LEFT AND RIGHT HEART CATHETERIZATION WITH CORONARY ANGIOGRAM;  Surgeon: Laverda Page, MD;  Location: [facility] CATH LAB;  Service: Cardiovascular;  Laterality: N/A;  . LUMBAR DISC SURGERY  1980's X2;  2000's  . MAZE N/A 08/22/2015   Procedure: MAZE;  Surgeon: Ivin Poot, MD;  Location: [place];  Service: Open Heart Surgery;  Laterality: N/A;  . PACEMAKER INSERTION  03/31/2012   STJ Accent DR pacemaker implanted by Dr Rayann Heman  . PERMANENT PACEMAKER INSERTION N/A 03/31/2012   Procedure: PERMANENT PACEMAKER INSERTION;  Surgeon:  Thompson Grayer, MD;  Location: [facility] CATH LAB;  Service: Cardiovascular;  Laterality: N/A;  . TEE WITHOUT CARDIOVERSION  01/30/2012   Procedure: TRANSESOPHAGEAL ECHOCARDIOGRAM (TEE);  Surgeon: Larey Dresser, MD;  Location: [place];  Service: Cardiovascular;  Laterality: N/A;  ablation next day  . TEE WITHOUT CARDIOVERSION N/A 08/22/2015   Procedure: TRANSESOPHAGEAL ECHOCARDIOGRAM (TEE);  Surgeon: Ivin Poot, MD;  Location: [place];  Service: Open Heart Surgery;  Laterality: N/A;  . TEE WITHOUT CARDIOVERSION N/A 10/19/2019   Procedure: TRANSESOPHAGEAL ECHOCARDIOGRAM (TEE);  Surgeon: Jolaine Artist, MD;  Location: [facility] ENDOSCOPY;  Service: Cardiovascular;  Laterality: N/A;  . US ECHOCARDIOGRAPHY  08/07/2009   EF 55-60%    Current Outpatient Medications  Medication Sig Dispense Refill  . albuterol (PROVENTIL HFA;VENTOLIN HFA) 108 (90 Base) MCG/ACT inhaler Inhale 2 puffs into the lungs every 6 (six) hours as needed for wheezing or shortness of breath. 1 Inhaler 5  . amLODipine (NORVASC) 10 MG tablet Take 0.5 tablets (5 mg total) by  mouth daily. 30 tablet 11  . apixaban (ELIQUIS) 5 MG TABS tablet Take 5 mg by mouth 2 (two) times daily.    . bisoprolol (ZEBETA) 5 MG tablet Take 5 mg by mouth daily.    . Budeson-Glycopyrrol-Formoterol (BREZTRI AEROSPHERE) 160-9-4.8 MCG/ACT AERO Inhale 2 puffs into the lungs in the morning and at bedtime. 5.9 g 0  . Cholecalciferol (VITAMIN D) 2000 UNITS CAPS Take 8,000 Units by mouth at bedtime.     . dofetilide (TIKOSYN) 250 MCG capsule Take 1 capsule (250 mcg total) by mouth 2 (two) times daily. 180 capsule 1  . empagliflozin (JARDIANCE) 10 MG TABS tablet Take 1 tablet (10 mg total) by mouth daily before breakfast. 30 tablet 6  . fexofenadine (ALLEGRA) 180 MG tablet Take 1 tablet (180 mg total) by mouth daily as needed for allergies or rhinitis.    [name] gabapentin (NEURONTIN) 300 MG capsule Take 600-1,200 mg by mouth See admin instructions. Take 600 mg by mouth in  the morning, 600 mg at lunchtime, and take 1200 mg at bedtime  3  . HYDROcodone-acetaminophen (NORCO/VICODIN) 5-325 MG tablet Take 1 tablet by mouth 2 (two) times daily as needed (pain).     [name] levothyroxine (SYNTHROID, LEVOTHROID) 175 MCG tablet Take 175 mcg by mouth daily before breakfast.    . magnesium oxide (MAG-OX) 400 MG tablet Take 400 mg by mouth daily.    . Multiple Vitamin (MULTIVITAMIN WITH MINERALS) TABS tablet Take 1 tablet by mouth daily. 30 tablet 0  . ondansetron (ZOFRAN-ODT) 4 MG disintegrating tablet Take 4 mg by mouth every 8 (eight) hours as needed for nausea or vomiting.    . OXYGEN Inhale 2 L into the lungs continuous.    . pantoprazole (PROTONIX) 40 MG tablet Take 40 mg by mouth daily.    . polyethylene glycol powder (GLYCOLAX/MIRALAX) 17 GM/SCOOP powder Take 17 g by mouth daily as needed for mild constipation.    . Polyvinyl Alcohol-Povidone (REFRESH OP) Place 1 drop into both eyes daily as needed (dry eyes).    . potassium chloride (KLOR-CON) 10 MEQ tablet Take 20 mEq by mouth See admin instructions. Take 20 meq twice every other day (take only on torsemide days)    . predniSONE (DELTASONE) 10 MG tablet Take 1 tab daily x 5 day; then resume 5mg  daily 5 tablet 0  . predniSONE (DELTASONE) 5 MG tablet Take 1 tablet (5 mg total) by mouth daily with breakfast. 30 tablet 3  . protein supplement shake (PREMIER PROTEIN) LIQD Take 11 oz by mouth daily.    [name] rOPINIRole (REQUIP) 1 MG tablet Take 1 tablet (1 mg total) by mouth in the morning and at bedtime.    . rosuvastatin (CRESTOR) 10 MG tablet Take 10 mg by mouth at bedtime.     [name] spironolactone (ALDACTONE) 25 MG tablet Take 1 tablet (25 mg total) by mouth daily. 90 tablet 3  . torsemide (DEMADEX) 20 MG tablet Take 40 mg by mouth every other day.    . traMADol-acetaminophen (ULTRACET) 37.5-325 MG tablet Take 2 tablets by mouth 2 (two) times daily.     No current facility-administered medications for this visit.    Allergies:    Meloxicam and Vancomycin   Social History:  The patient  reports that he quit smoking about 28 years ago. His smoking use included cigarettes. He has a 25.00 pack-year smoking history. He has never used smokeless tobacco. He reports previous alcohol use. He reports current drug use. Drug: Marijuana.  ROS:  Please see the history of present illness.   All other systems are personally reviewed and negative.    Exam:    Vital Signs:  Ht 5\' 11"  (1.803 m)   Wt 198 lb (89.8 kg)   BMI 27.62 kg/m   Well sounding, alert and conversant   Labs/Other Tests and Data Reviewed:    Recent Labs: 01/22/2020: ALT 32; B Natriuretic Peptide 271.6; TSH 0.044 03/14/2020: BUN 23; Creatinine, Ser 1.82; Hemoglobin 12.5; Magnesium 2.3; Platelets 170; Potassium 4.6; Sodium 140   Wt Readings from Last 3 Encounters:  03/27/20 198 lb (89.8 kg)  03/24/20 199 lb 3.2 oz (90.4 kg)  03/16/20 198 lb 3.2 oz (89.9 kg)    Prior echo, CHF clinic notes, AF clinic notes, my prior notes   ASSESSMENT & PLAN:    1.  Persistent afib The patient has symptomatic, recurrent persistent atrial fibrillation. he has failed medical therapy with tikosyn. He is s/p PVI by me in 2013 as well as MAZE by Dr Prescott Gum in 2017 with LAA clilp Chads2vasc score is 4.  he is anticoagulated with eliquis . Therapeutic strategies for afib including medicine (amiodarone) and ablation were discussed in detail with the patient today. Risk, benefits, and alternatives to EP study and radiofrequency ablation for afib were also discussed in detail today. These risks include but are not limited to stroke, bleeding, vascular damage, tamponade, perforation, damage to the esophagus, lungs, and other structures, pulmonary vein stenosis, worsening renal function, and death. The patient understands these risk and wishes to proceed.  We will therefore proceed with catheter ablation at the next available time.  Carto, ICE, anesthesia are requested for the  procedure.  Will also obtain cardiac CT prior to the procedure to exclude LAA thrombus and assess for CLIP leak (if creatinine will allow) and further evaluate atrial anatomy.  2. Complete heart block Normal pacemaker function  3. Chronic diastolic dysfunction Stable No change required today   Risks, benefits and potential toxicities for medications prescribed and/or refilled reviewed with patient today.      Patient Risk:  after full review of this patients clinical status, I feel that they are at moderate risk at this time.  Today, I have spent 15 minutes with the patient with telehealth technology discussing arrhythmia management .    Army Fossa, MD  03/27/2020 8:54 AM     [facility] [address] [place] [address] [phone] (office) [phone] (fax)

## 2020-03-27 NOTE — Assessment & Plan Note (Addendum)
-   Patient will be meeting with cardiology for virtual visit on 03/27/20 to discuss cardiac ablation. Not date set as of now. He is considered a low to intermediate risk for post-op pulmonary complications based on age, hx COPD and chronic respiratory failure on 2L oxygen. He has some increased shortness of breath today but he ran out of his ICS/LABA/LAMA inhaler several weeks ago, sample given and patient has been approved for assistance. No clear signs of an AECOPD. No new oxygen requirements. Continue bronchodilators as prescribed. Encourage early ambulation, IS and DVT prophylaxis.  Major Pulmonary risks identified in the multifactorial risk analysis are but not limited to a) pneumonia; b) recurrent intubation risk; c) prolonged or recurrent acute respiratory failure needing mechanical ventilation; d) prolonged hospitalization; e) DVT/Pulmonary embolism; f) Acute Pulmonary edema  Recommend 1. Short duration of surgery as much as possible and avoid paralytic if possible 2. Recovery in step down or ICU with Pulmonary consultation if needed  3. DVT prophylaxis 4. Aggressive pulmonary toilet with o2, bronchodilatation, and incentive spirometry and early ambulation

## 2020-03-27 NOTE — Assessment & Plan Note (Addendum)
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 Recently started on Breztri, ran out of medication several weeks ago while awaiting patient assistance approval. He has been using Dulera 2 puffs twice daily. Reports increased shortness of breath being off TRIPLE THERAPY. This is likely d.t running out of BREZTRI inhaler and not a true exacerbations of his COPD. Will will give him a sample today to hold him over until get gets medication which has been approved. Advised patient to increased prednisoen to 10mg  x 1 week then resume chronic 5mg  prednisone daily. FU in 3 months.

## 2020-03-27 NOTE — Assessment & Plan Note (Signed)
-   Continue Oxygen 2L continuously, no new requirements

## 2020-03-27 NOTE — Telephone Encounter (Signed)
Set up ablation, labs, and covid screening. Advised instructions will come through mychart for the ablation and the cardiac ct. This will be under the menu tap and then letters.   Patient verbalized understanding.

## 2020-03-27 NOTE — Progress Notes (Signed)
Electrophysiology TeleHealth Note  Due to national recommendations of social distancing due to [place] 19, an audio telehealth visit is felt to be most appropriate for this patient at this time.  Verbal consent was obtained by me for the telehealth visit today.  The patient does not have capability for a virtual visit.  A phone visit is therefore required today.   Date:  03/27/2020   ID:  [name], DOB 05-Apr-1951, MRN [id number]  Location: patient's home  Provider location:  [place]  Evaluation Performed: Follow-up visit  PCP:  Velna Hatchet, MD   Electrophysiologist:  Dr Rayann Heman  Chief Complaint:  palpitations  History of Present Illness:    [name] is a 69 y.o. male who presents via telehealth conferencing today.  Since last being seen in our clinic, the patient reports doing reasonably well.  His afib has returned.  He reports symptoms of fatigue and SOB.  He feels "Exhausted".  He is on [place].  He recently failed cardioversion.  Today, he denies symptoms of palpitations, chest pain, shortness of breath,  lower extremity edema, dizziness, presyncope, or syncope.  The patient is otherwise without complaint today.     Past Medical History:  Diagnosis Date  . Aortic stenosis, mild   . Arthritis of hand    "just a little bit in both hands" (03/31/2012)  . Asthma    "little bit" (03/31/2012)  . Atrial fibrillation [facility])    dx '04; DCCV '04, placed on flecainide, failed DCCV 04/2010, flecainide stopped; s/p successful A.fib ablation 01/31/12  . CHF (congestive heart failure) ([place])   . Controlled type 2 diabetes mellitus without complication, without long-term current use of insulin ([place]) 12/13/2019  . COPD (chronic obstructive pulmonary disease) ([place])   . DJD (degenerative joint disease)   . Fibromyalgia   . First degree AV block    post ablation, ~411ms  . GERD (gastroesophageal reflux disease)   . Hyperlipidemia   . Hypertension   . Hypothyroidism     S/P radiation  . Left atrial enlargement    LA size 49mm by echo 11/21/11  . Mitral regurgitation    trivial  . Obstructive sleep apnea    mild by sleep study 2013; pt stated he does not have a machine because "it wasn't bad enough for him to have one"  . Pacemaker 03/31/2012  . Restless leg syndrome   . Second degree AV block   . Splenomegaly   . Visit for monitoring Tikosyn therapy 04/16/2016    Past Surgical History:  Procedure Laterality Date  . AORTIC VALVE REPLACEMENT N/A 08/22/2015   Procedure: AORTIC VALVE REPLACEMENT (AVR);  Surgeon: Ivin Poot, MD;  Location: [place];  Service: Open Heart Surgery;  Laterality: N/A;  . ATRIAL FIBRILLATION ABLATION  01/30/2012   PVI by Dr. Rayann Heman  . ATRIAL FIBRILLATION ABLATION N/A 01/31/2012   Procedure: ATRIAL FIBRILLATION ABLATION;  Surgeon: Thompson Grayer, MD;  Location: [facility] CATH LAB;  Service: Cardiovascular;  Laterality: N/A;  . BACK SURGERY     X 3  . CARDIAC CATHETERIZATION N/A 08/09/2015   Procedure: Right/Left Heart Cath and Coronary Angiography;  Surgeon: Peter M Martinique, MD;  Location: [place] CV LAB;  Service: Cardiovascular;  Laterality: N/A;  . CARDIAC CATHETERIZATION N/A 02/05/2016   Procedure: Right/Left Heart Cath and Coronary Angiography;  Surgeon: Jettie Booze, MD;  Location: [place] CV LAB;  Service: Cardiovascular;  Laterality: N/A;  . CARDIAC CATHETERIZATION N/A 04/17/2016  Procedure: Right Heart Cath;  Surgeon: Jolaine Artist, MD;  Location: [place] CV LAB;  Service: Cardiovascular;  Laterality: N/A;  . CARDIOVASCULAR STRESS TEST  03/12/2002   EF 48%, NO EVIDENCE OF ISCHEMIA  . CARDIOVERSION  01/2012; 03/31/2012  . CARDIOVERSION N/A 02/25/2012   Procedure: CARDIOVERSION;  Surgeon: Thompson Grayer, MD;  Location: [facility] CATH LAB;  Service: Cardiovascular;  Laterality: N/A;  . CARDIOVERSION N/A 03/31/2012   Procedure: CARDIOVERSION;  Surgeon: Thompson Grayer, MD;  Location: [facility] CATH LAB;  Service: Cardiovascular;   Laterality: N/A;  . CARDIOVERSION N/A 11/23/2015   Procedure: CARDIOVERSION;  Surgeon: Larey Dresser, MD;  Location: [place];  Service: Cardiovascular;  Laterality: N/A;  . CARDIOVERSION N/A 04/08/2016   Procedure: CARDIOVERSION;  Surgeon: Jolaine Artist, MD;  Location: [facility] ENDOSCOPY;  Service: Cardiovascular;  Laterality: N/A;  . CARDIOVERSION N/A 09/14/2018   Procedure: CARDIOVERSION;  Surgeon: Josue Hector, MD;  Location: [facility] ENDOSCOPY;  Service: Cardiovascular;  Laterality: N/A;  . CARDIOVERSION N/A 10/19/2019   Procedure: CARDIOVERSION;  Surgeon: Jolaine Artist, MD;  Location: [facility] ENDOSCOPY;  Service: Cardiovascular;  Laterality: N/A;  . CARDIOVERSION N/A 02/25/2020   Procedure: CARDIOVERSION;  Surgeon: Fay Records, MD;  Location: [place];  Service: Cardiovascular;  Laterality: N/A;  . CLIPPING OF ATRIAL APPENDAGE N/A 08/22/2015   Procedure: CLIPPING OF ATRIAL APPENDAGE;  Surgeon: Ivin Poot, MD;  Location: [place];  Service: Open Heart Surgery;  Laterality: N/A;  . DOPPLER ECHOCARDIOGRAPHY  03/11/2002   EF 70-75%  . FINGER SURGERY Left    Middle finger  . FINGER TENDON REPAIR  1980's   "right little finger" (03/31/2012)  . INSERT / REPLACE / REMOVE PACEMAKER     [employer]  . IR RADIOLOGIST EVAL & MGMT  05/06/2019  . IR RADIOLOGIST EVAL & MGMT  10/06/2019  . LEFT AND RIGHT HEART CATHETERIZATION WITH CORONARY ANGIOGRAM N/A 10/27/2012   Procedure: LEFT AND RIGHT HEART CATHETERIZATION WITH CORONARY ANGIOGRAM;  Surgeon: Laverda Page, MD;  Location: [facility] CATH LAB;  Service: Cardiovascular;  Laterality: N/A;  . LUMBAR DISC SURGERY  1980's X2;  2000's  . MAZE N/A 08/22/2015   Procedure: MAZE;  Surgeon: Ivin Poot, MD;  Location: [place];  Service: Open Heart Surgery;  Laterality: N/A;  . PACEMAKER INSERTION  03/31/2012   STJ Accent DR pacemaker implanted by Dr Rayann Heman  . PERMANENT PACEMAKER INSERTION N/A 03/31/2012   Procedure: PERMANENT PACEMAKER INSERTION;  Surgeon:  Thompson Grayer, MD;  Location: [facility] CATH LAB;  Service: Cardiovascular;  Laterality: N/A;  . TEE WITHOUT CARDIOVERSION  01/30/2012   Procedure: TRANSESOPHAGEAL ECHOCARDIOGRAM (TEE);  Surgeon: Larey Dresser, MD;  Location: [place];  Service: Cardiovascular;  Laterality: N/A;  ablation next day  . TEE WITHOUT CARDIOVERSION N/A 08/22/2015   Procedure: TRANSESOPHAGEAL ECHOCARDIOGRAM (TEE);  Surgeon: Ivin Poot, MD;  Location: [place];  Service: Open Heart Surgery;  Laterality: N/A;  . TEE WITHOUT CARDIOVERSION N/A 10/19/2019   Procedure: TRANSESOPHAGEAL ECHOCARDIOGRAM (TEE);  Surgeon: Jolaine Artist, MD;  Location: [facility] ENDOSCOPY;  Service: Cardiovascular;  Laterality: N/A;  . US ECHOCARDIOGRAPHY  08/07/2009   EF 55-60%    Current Outpatient Medications  Medication Sig Dispense Refill  . albuterol (PROVENTIL HFA;VENTOLIN HFA) 108 (90 Base) MCG/ACT inhaler Inhale 2 puffs into the lungs every 6 (six) hours as needed for wheezing or shortness of breath. 1 Inhaler 5  . amLODipine (NORVASC) 10 MG tablet Take 0.5 tablets (5 mg total) by  mouth daily. 30 tablet 11  . apixaban (ELIQUIS) 5 MG TABS tablet Take 5 mg by mouth 2 (two) times daily.    . bisoprolol (ZEBETA) 5 MG tablet Take 5 mg by mouth daily.    . Budeson-Glycopyrrol-Formoterol (BREZTRI AEROSPHERE) 160-9-4.8 MCG/ACT AERO Inhale 2 puffs into the lungs in the morning and at bedtime. 5.9 g 0  . Cholecalciferol (VITAMIN D) 2000 UNITS CAPS Take 8,000 Units by mouth at bedtime.     . dofetilide (TIKOSYN) 250 MCG capsule Take 1 capsule (250 mcg total) by mouth 2 (two) times daily. 180 capsule 1  . empagliflozin (JARDIANCE) 10 MG TABS tablet Take 1 tablet (10 mg total) by mouth daily before breakfast. 30 tablet 6  . fexofenadine (ALLEGRA) 180 MG tablet Take 1 tablet (180 mg total) by mouth daily as needed for allergies or rhinitis.    [name] gabapentin (NEURONTIN) 300 MG capsule Take 600-1,200 mg by mouth See admin instructions. Take 600 mg by mouth in  the morning, 600 mg at lunchtime, and take 1200 mg at bedtime  3  . HYDROcodone-acetaminophen (NORCO/VICODIN) 5-325 MG tablet Take 1 tablet by mouth 2 (two) times daily as needed (pain).     [name] levothyroxine (SYNTHROID, LEVOTHROID) 175 MCG tablet Take 175 mcg by mouth daily before breakfast.    . magnesium oxide (MAG-OX) 400 MG tablet Take 400 mg by mouth daily.    . Multiple Vitamin (MULTIVITAMIN WITH MINERALS) TABS tablet Take 1 tablet by mouth daily. 30 tablet 0  . ondansetron (ZOFRAN-ODT) 4 MG disintegrating tablet Take 4 mg by mouth every 8 (eight) hours as needed for nausea or vomiting.    . OXYGEN Inhale 2 L into the lungs continuous.    . pantoprazole (PROTONIX) 40 MG tablet Take 40 mg by mouth daily.    . polyethylene glycol powder (GLYCOLAX/MIRALAX) 17 GM/SCOOP powder Take 17 g by mouth daily as needed for mild constipation.    . Polyvinyl Alcohol-Povidone (REFRESH OP) Place 1 drop into both eyes daily as needed (dry eyes).    . potassium chloride (KLOR-CON) 10 MEQ tablet Take 20 mEq by mouth See admin instructions. Take 20 meq twice every other day (take only on torsemide days)    . predniSONE (DELTASONE) 10 MG tablet Take 1 tab daily x 5 day; then resume 5mg  daily 5 tablet 0  . predniSONE (DELTASONE) 5 MG tablet Take 1 tablet (5 mg total) by mouth daily with breakfast. 30 tablet 3  . protein supplement shake (PREMIER PROTEIN) LIQD Take 11 oz by mouth daily.    [name] rOPINIRole (REQUIP) 1 MG tablet Take 1 tablet (1 mg total) by mouth in the morning and at bedtime.    . rosuvastatin (CRESTOR) 10 MG tablet Take 10 mg by mouth at bedtime.     [name] spironolactone (ALDACTONE) 25 MG tablet Take 1 tablet (25 mg total) by mouth daily. 90 tablet 3  . torsemide (DEMADEX) 20 MG tablet Take 40 mg by mouth every other day.    . traMADol-acetaminophen (ULTRACET) 37.5-325 MG tablet Take 2 tablets by mouth 2 (two) times daily.     No current facility-administered medications for this visit.    Allergies:    Meloxicam and Vancomycin   Social History:  The patient  reports that he quit smoking about 28 years ago. His smoking use included cigarettes. He has a 25.00 pack-year smoking history. He has never used smokeless tobacco. He reports previous alcohol use. He reports current drug use. Drug: Marijuana.  ROS:  Please see the history of present illness.   All other systems are personally reviewed and negative.    Exam:    Vital Signs:  Ht 5\' 11"  (1.803 m)   Wt 198 lb (89.8 kg)   BMI 27.62 kg/m   Well sounding, alert and conversant   Labs/Other Tests and Data Reviewed:    Recent Labs: 01/22/2020: ALT 32; B Natriuretic Peptide 271.6; TSH 0.044 03/14/2020: BUN 23; Creatinine, Ser 1.82; Hemoglobin 12.5; Magnesium 2.3; Platelets 170; Potassium 4.6; Sodium 140   Wt Readings from Last 3 Encounters:  03/27/20 198 lb (89.8 kg)  03/24/20 199 lb 3.2 oz (90.4 kg)  03/16/20 198 lb 3.2 oz (89.9 kg)    Prior echo, CHF clinic notes, AF clinic notes, my prior notes   ASSESSMENT & PLAN:    1.  Persistent afib The patient has symptomatic, recurrent persistent atrial fibrillation. he has failed medical therapy with tikosyn. He is s/p PVI by me in 2013 as well as MAZE by Dr Prescott Gum in 2017 with LAA clilp Chads2vasc score is 4.  he is anticoagulated with eliquis . Therapeutic strategies for afib including medicine (amiodarone) and ablation were discussed in detail with the patient today. Risk, benefits, and alternatives to EP study and radiofrequency ablation for afib were also discussed in detail today. These risks include but are not limited to stroke, bleeding, vascular damage, tamponade, perforation, damage to the esophagus, lungs, and other structures, pulmonary vein stenosis, worsening renal function, and death. The patient understands these risk and wishes to proceed.  We will therefore proceed with catheter ablation at the next available time.  Carto, ICE, anesthesia are requested for the  procedure.  Will also obtain cardiac CT prior to the procedure to exclude LAA thrombus and assess for CLIP leak (if creatinine will allow) and further evaluate atrial anatomy.  2. Complete heart block Normal pacemaker function  3. Chronic diastolic dysfunction Stable No change required today   Risks, benefits and potential toxicities for medications prescribed and/or refilled reviewed with patient today.      Patient Risk:  after full review of this patients clinical status, I feel that they are at moderate risk at this time.  Today, I have spent 15 minutes with the patient with telehealth technology discussing arrhythmia management .    Army Fossa, MD  03/27/2020 8:54 AM     [facility] [address] [place] [address] [phone] (office) [phone] (fax)

## 2020-03-27 NOTE — Telephone Encounter (Signed)
-----   Message from Thompson Grayer, MD sent at 03/27/2020  9:14 AM EST ----- Afib ablation C/I/A  Cardiac CT if creatinine will allow.  I would prefer CT over TEE if possible.

## 2020-03-29 ENCOUNTER — Other Ambulatory Visit: Payer: Medicare Other

## 2020-03-29 ENCOUNTER — Other Ambulatory Visit: Payer: Self-pay

## 2020-03-29 DIAGNOSIS — I4891 Unspecified atrial fibrillation: Secondary | ICD-10-CM

## 2020-03-29 LAB — CBC WITH DIFFERENTIAL/PLATELET
Basophils Absolute: 0 10*3/uL (ref 0.0–0.2)
Basos: 0 %
EOS (ABSOLUTE): 0.1 10*3/uL (ref 0.0–0.4)
Eos: 1 %
Hematocrit: 39.4 % (ref 37.5–51.0)
Hemoglobin: 12.9 g/dL — ABNORMAL LOW (ref 13.0–17.7)
Immature Grans (Abs): 0.1 10*3/uL (ref 0.0–0.1)
Immature Granulocytes: 1 %
Lymphocytes Absolute: 1.2 10*3/uL (ref 0.7–3.1)
Lymphs: 11 %
MCH: 30.1 pg (ref 26.6–33.0)
MCHC: 32.7 g/dL (ref 31.5–35.7)
MCV: 92 fL (ref 79–97)
Monocytes Absolute: 0.8 10*3/uL (ref 0.1–0.9)
Monocytes: 7 %
Neutrophils Absolute: 9 10*3/uL — ABNORMAL HIGH (ref 1.4–7.0)
Neutrophils: 80 %
Platelets: 178 10*3/uL (ref 150–450)
RBC: 4.29 x10E6/uL (ref 4.14–5.80)
RDW: 13.7 % (ref 11.6–15.4)
WBC: 11.2 10*3/uL — ABNORMAL HIGH (ref 3.4–10.8)

## 2020-03-29 LAB — BASIC METABOLIC PANEL
BUN/Creatinine Ratio: 18 (ref 10–24)
BUN: 33 mg/dL — ABNORMAL HIGH (ref 8–27)
CO2: 27 mmol/L (ref 20–29)
Calcium: 9.6 mg/dL (ref 8.6–10.2)
Chloride: 96 mmol/L (ref 96–106)
Creatinine, Ser: 1.88 mg/dL — ABNORMAL HIGH (ref 0.76–1.27)
GFR calc Af Amer: 41 mL/min/{1.73_m2} — ABNORMAL LOW (ref >59)
GFR calc non Af Amer: 35 mL/min/{1.73_m2} — ABNORMAL LOW (ref >59)
Glucose: 251 mg/dL — ABNORMAL HIGH (ref 65–99)
Potassium: 4.4 mmol/L (ref 3.5–5.2)
Sodium: 138 mmol/L (ref 134–144)

## 2020-04-10 ENCOUNTER — Telehealth (HOSPITAL_COMMUNITY): Payer: Self-pay | Admitting: Emergency Medicine

## 2020-04-10 NOTE — Telephone Encounter (Signed)
Spoke with wife (christie) Reaching out to patient to offer assistance regarding upcoming cardiac imaging study; pt verbalizes understanding of appt date/time, parking situation and where to check in, pre-test NPO status and medications ordered, and verified current allergies; name and call back number provided for further questions should they arise Marchia Bond RN Navigator Cardiac Imaging [facility] [phone] office [phone] cell   Informed of renal insufficiency as demonstrated by recent labs. Additional appt made for IV hydration at 1200. vebalized understanding. Clarise Cruz

## 2020-04-12 ENCOUNTER — Ambulatory Visit (HOSPITAL_COMMUNITY)
Admission: RE | Admit: 2020-04-12 | Discharge: 2020-04-12 | Disposition: A | Discharge status: Home / Self Care | Payer: Medicare Other | Source: Ambulatory Visit | Attending: Internal Medicine | Admitting: Internal Medicine

## 2020-04-12 ENCOUNTER — Encounter (HOSPITAL_COMMUNITY): Payer: Self-pay

## 2020-04-12 ENCOUNTER — Other Ambulatory Visit: Payer: Self-pay

## 2020-04-12 DIAGNOSIS — I4891 Unspecified atrial fibrillation: Secondary | ICD-10-CM | POA: Diagnosis present

## 2020-04-12 LAB — BASIC METABOLIC PANEL
Anion gap: 11 (ref 5–15)
BUN: 26 mg/dL — ABNORMAL HIGH (ref 8–23)
CO2: 26 mmol/L (ref 22–32)
Calcium: 9.6 mg/dL (ref 8.9–10.3)
Chloride: 101 mmol/L (ref 98–111)
Creatinine, Ser: 1.83 mg/dL — ABNORMAL HIGH (ref 0.61–1.24)
GFR, Estimated: 39 mL/min — ABNORMAL LOW (ref >60)
Glucose, Bld: 138 mg/dL — ABNORMAL HIGH (ref 70–99)
Potassium: 4.2 mmol/L (ref 3.5–5.1)
Sodium: 138 mmol/L (ref 135–145)

## 2020-04-12 MED ORDER — SODIUM CHLORIDE 0.9 % WEIGHT BASED INFUSION: 1.0000 mL/kg/h | INTRAVENOUS | Status: DC

## 2020-04-12 MED ORDER — IOHEXOL 350 MG/ML SOLN
80.0000 mL | Freq: Once | INTRAVENOUS | Status: AC | PRN
  Administered 2020-04-12: 80 mL via INTRAVENOUS

## 2020-04-12 MED ORDER — SODIUM CHLORIDE 0.9 % WEIGHT BASED INFUSION
3.0000 mL/kg/h | INTRAVENOUS | Status: DC
  Administered 2020-04-12: 3 mL/kg/h via INTRAVENOUS

## 2020-04-12 NOTE — Progress Notes (Signed)
Spoke  with Duard Brady in [place] about patient first hour fluids will be complete by 1320.

## 2020-04-13 ENCOUNTER — Ambulatory Visit (INDEPENDENT_AMBULATORY_CARE_PROVIDER_SITE_OTHER): Payer: Medicare Other

## 2020-04-13 DIAGNOSIS — I4819 Other persistent atrial fibrillation: Secondary | ICD-10-CM

## 2020-04-13 LAB — CUP PACEART REMOTE DEVICE CHECK
Battery Remaining Longevity: 1 mo
Battery Remaining Percentage: 1 %
Battery Voltage: 2.63 V
Brady Statistic AP VP Percent: 49 %
Brady Statistic AP VS Percent: 1 %
Brady Statistic AS VP Percent: 50 %
Brady Statistic AS VS Percent: 1 %
Brady Statistic RA Percent Paced: 1.6 %
Brady Statistic RV Percent Paced: 99 %
Date Time Interrogation Session: 20211216035213
Implantable Lead Implant Date: 20131203
Implantable Lead Implant Date: 20131203
Implantable Lead Location: 753859
Implantable Lead Location: 753860
Implantable Lead Model: 1948
Implantable Pulse Generator Implant Date: 20131203
Lead Channel Impedance Value: 440 Ohm
Lead Channel Impedance Value: 630 Ohm
Lead Channel Pacing Threshold Amplitude: 0.75 V
Lead Channel Pacing Threshold Amplitude: 0.875 V
Lead Channel Pacing Threshold Pulse Width: 0.5 ms
Lead Channel Pacing Threshold Pulse Width: 0.8 ms
Lead Channel Sensing Intrinsic Amplitude: 12 mV
Lead Channel Sensing Intrinsic Amplitude: 3.3 mV
Lead Channel Setting Pacing Amplitude: 1 V
Lead Channel Setting Pacing Amplitude: 1.875
Lead Channel Setting Pacing Pulse Width: 0.8 ms
Lead Channel Setting Sensing Sensitivity: 4 mV
Pulse Gen Model: 2210
Pulse Gen Serial Number: 7408461

## 2020-04-17 ENCOUNTER — Other Ambulatory Visit (HOSPITAL_COMMUNITY)
Admission: RE | Admit: 2020-04-17 | Discharge: 2020-04-17 | Disposition: A | Discharge status: Home / Self Care | Payer: Medicare Other | Source: Ambulatory Visit | Attending: Internal Medicine | Admitting: Internal Medicine

## 2020-04-17 DIAGNOSIS — Z20822 Contact with and (suspected) exposure to covid-19: Secondary | ICD-10-CM | POA: Insufficient documentation

## 2020-04-17 DIAGNOSIS — Z01812 Encounter for preprocedural laboratory examination: Secondary | ICD-10-CM | POA: Insufficient documentation

## 2020-04-17 LAB — SARS CORONAVIRUS 2 (TAT 6-24 HRS): SARS Coronavirus 2: NEGATIVE

## 2020-04-18 NOTE — Progress Notes (Signed)
Instructed patient on the following items: Arrival time 0530 Nothing to eat or drink after midnight No meds AM of procedure Responsible person to drive you home and stay with you for 24 hrs  Have you missed any doses of anti-coagulant Eliquis- hasn't missed any doses    

## 2020-04-19 ENCOUNTER — Other Ambulatory Visit: Payer: Self-pay

## 2020-04-19 ENCOUNTER — Ambulatory Visit (HOSPITAL_COMMUNITY): Payer: Medicare Other | Admitting: Anesthesiology

## 2020-04-19 ENCOUNTER — Encounter (HOSPITAL_COMMUNITY)
Admission: RE | Disposition: A | Discharge status: Home / Self Care | Payer: Self-pay | Source: Home / Self Care | Attending: Internal Medicine

## 2020-04-19 ENCOUNTER — Encounter (HOSPITAL_COMMUNITY): Payer: Self-pay | Admitting: Internal Medicine

## 2020-04-19 ENCOUNTER — Ambulatory Visit (HOSPITAL_COMMUNITY)
Admission: RE | Admit: 2020-04-19 | Discharge: 2020-04-19 | Disposition: A | Discharge status: Home / Self Care | Payer: Medicare Other | Attending: Internal Medicine | Admitting: Internal Medicine

## 2020-04-19 DIAGNOSIS — Z950 Presence of cardiac pacemaker: Secondary | ICD-10-CM | POA: Insufficient documentation

## 2020-04-19 DIAGNOSIS — I442 Atrioventricular block, complete: Secondary | ICD-10-CM | POA: Diagnosis not present

## 2020-04-19 DIAGNOSIS — Z881 Allergy status to other antibiotic agents status: Secondary | ICD-10-CM | POA: Insufficient documentation

## 2020-04-19 DIAGNOSIS — Z7901 Long term (current) use of anticoagulants: Secondary | ICD-10-CM | POA: Diagnosis not present

## 2020-04-19 DIAGNOSIS — Z87891 Personal history of nicotine dependence: Secondary | ICD-10-CM | POA: Insufficient documentation

## 2020-04-19 DIAGNOSIS — Z952 Presence of prosthetic heart valve: Secondary | ICD-10-CM | POA: Insufficient documentation

## 2020-04-19 DIAGNOSIS — I4819 Other persistent atrial fibrillation: Secondary | ICD-10-CM | POA: Diagnosis present

## 2020-04-19 DIAGNOSIS — Z9981 Dependence on supplemental oxygen: Secondary | ICD-10-CM | POA: Diagnosis not present

## 2020-04-19 DIAGNOSIS — I110 Hypertensive heart disease with heart failure: Secondary | ICD-10-CM | POA: Insufficient documentation

## 2020-04-19 DIAGNOSIS — Z79899 Other long term (current) drug therapy: Secondary | ICD-10-CM | POA: Diagnosis not present

## 2020-04-19 DIAGNOSIS — I5032 Chronic diastolic (congestive) heart failure: Secondary | ICD-10-CM | POA: Diagnosis not present

## 2020-04-19 DIAGNOSIS — Z888 Allergy status to other drugs, medicaments and biological substances status: Secondary | ICD-10-CM | POA: Insufficient documentation

## 2020-04-19 HISTORY — PX: ATRIAL FIBRILLATION ABLATION: EP1191

## 2020-04-19 LAB — GLUCOSE, CAPILLARY
Glucose-Capillary: 114 mg/dL — ABNORMAL HIGH (ref 70–99)
Glucose-Capillary: 135 mg/dL — ABNORMAL HIGH (ref 70–99)

## 2020-04-19 LAB — POCT ACTIVATED CLOTTING TIME: Activated Clotting Time: 285 seconds

## 2020-04-19 SURGERY — ATRIAL FIBRILLATION ABLATION
Anesthesia: General

## 2020-04-19 MED ORDER — HEPARIN SODIUM (PORCINE) 1000 UNIT/ML IJ SOLN
INTRAMUSCULAR | Status: DC | PRN
  Administered 2020-04-19: 1000 [IU] via INTRAVENOUS

## 2020-04-19 MED ORDER — CEFAZOLIN SODIUM-DEXTROSE 2-4 GM/100ML-% IV SOLN
INTRAVENOUS | Status: AC
  Filled 2020-04-19: qty 100

## 2020-04-19 MED ORDER — APIXABAN 5 MG PO TABS
5.0000 mg | ORAL_TABLET | Freq: Once | ORAL | Status: AC
  Administered 2020-04-19: 5 mg via ORAL
  Filled 2020-04-19: qty 1

## 2020-04-19 MED ORDER — PROTAMINE SULFATE 10 MG/ML IV SOLN
INTRAVENOUS | Status: DC | PRN
  Administered 2020-04-19: 40 mg via INTRAVENOUS

## 2020-04-19 MED ORDER — ROCURONIUM BROMIDE 10 MG/ML (PF) SYRINGE
PREFILLED_SYRINGE | INTRAVENOUS | Status: DC | PRN
  Administered 2020-04-19: 100 mg via INTRAVENOUS

## 2020-04-19 MED ORDER — ONDANSETRON HCL 4 MG/2ML IJ SOLN: 4.0000 mg | Freq: Four times a day (QID) | INTRAMUSCULAR | Status: DC | PRN

## 2020-04-19 MED ORDER — HEPARIN SODIUM (PORCINE) 1000 UNIT/ML IJ SOLN
INTRAMUSCULAR | Status: DC | PRN
  Administered 2020-04-19: 14000 [IU] via INTRAVENOUS

## 2020-04-19 MED ORDER — PROPOFOL 10 MG/ML IV BOLUS
INTRAVENOUS | Status: DC | PRN
  Administered 2020-04-19: 160 mg via INTRAVENOUS
  Administered 2020-04-19: 20 mg via INTRAVENOUS

## 2020-04-19 MED ORDER — ALBUTEROL SULFATE HFA 108 (90 BASE) MCG/ACT IN AERS
INHALATION_SPRAY | RESPIRATORY_TRACT | Status: DC | PRN
  Administered 2020-04-19: 4 via RESPIRATORY_TRACT

## 2020-04-19 MED ORDER — ACETAMINOPHEN 325 MG PO TABS: 650.0000 mg | ORAL_TABLET | ORAL | Status: DC | PRN

## 2020-04-19 MED ORDER — HEPARIN (PORCINE) IN NACL 1000-0.9 UT/500ML-% IV SOLN
INTRAVENOUS | Status: DC | PRN
  Administered 2020-04-19: 500 mL

## 2020-04-19 MED ORDER — DEXAMETHASONE SODIUM PHOSPHATE 10 MG/ML IJ SOLN
INTRAMUSCULAR | Status: DC | PRN
  Administered 2020-04-19: 8 mg via INTRAVENOUS

## 2020-04-19 MED ORDER — HYDROCODONE-ACETAMINOPHEN 5-325 MG PO TABS: 1.0000 | ORAL_TABLET | ORAL | Status: DC | PRN

## 2020-04-19 MED ORDER — MIDAZOLAM HCL 2 MG/2ML IJ SOLN
INTRAMUSCULAR | Status: DC | PRN
  Administered 2020-04-19 (×2): 1 mg via INTRAVENOUS

## 2020-04-19 MED ORDER — SODIUM CHLORIDE 0.9 % IV SOLN: 250.0000 mL | INTRAVENOUS | Status: DC | PRN

## 2020-04-19 MED ORDER — SUGAMMADEX SODIUM 200 MG/2ML IV SOLN
INTRAVENOUS | Status: DC | PRN
  Administered 2020-04-19: 400 mg via INTRAVENOUS

## 2020-04-19 MED ORDER — SODIUM CHLORIDE 0.9% FLUSH: 3.0000 mL | Freq: Two times a day (BID) | INTRAVENOUS | Status: DC

## 2020-04-19 MED ORDER — ONDANSETRON HCL 4 MG/2ML IJ SOLN
INTRAMUSCULAR | Status: DC | PRN
  Administered 2020-04-19: 4 mg via INTRAVENOUS

## 2020-04-19 MED ORDER — HEPARIN SODIUM (PORCINE) 1000 UNIT/ML IJ SOLN
INTRAMUSCULAR | Status: DC | PRN
  Administered 2020-04-19: 4000 [IU] via INTRAVENOUS

## 2020-04-19 MED ORDER — SODIUM CHLORIDE 0.9 % IV SOLN: INTRAVENOUS | Status: DC

## 2020-04-19 MED ORDER — FENTANYL CITRATE (PF) 250 MCG/5ML IJ SOLN
INTRAMUSCULAR | Status: DC | PRN
  Administered 2020-04-19: 100 ug via INTRAVENOUS

## 2020-04-19 MED ORDER — SODIUM CHLORIDE 0.9% FLUSH: 3.0000 mL | INTRAVENOUS | Status: DC | PRN

## 2020-04-19 MED ORDER — HEPARIN SODIUM (PORCINE) 1000 UNIT/ML IJ SOLN
INTRAMUSCULAR | Status: AC
  Filled 2020-04-19: qty 2

## 2020-04-19 SURGICAL SUPPLY — 19 items
BLANKET WARM UNDERBOD FULL ACC (MISCELLANEOUS) ×2 IMPLANT
CATH 8FR REPROCESSED SOUNDSTAR (CATHETERS) ×2 IMPLANT
CATH MAPPNG PENTARAY F 2-6-2MM (CATHETERS) ×1 IMPLANT
CATH SMTCH THERMOCOOL SF DF (CATHETERS) ×2 IMPLANT
CATH WEB BI DIR CSDF CRV REPRO (CATHETERS) ×2 IMPLANT
CLOSURE PERCLOSE PROSTYLE (VASCULAR PRODUCTS) ×6 IMPLANT
COVER SWIFTLINK CONNECTOR (BAG) ×2 IMPLANT
MAT PREVALON FULL STRYKER (MISCELLANEOUS) ×2 IMPLANT
NEEDLE BAYLIS TRANSSEPTAL 71CM (NEEDLE) ×2 IMPLANT
PACK EP LATEX FREE (CUSTOM PROCEDURE TRAY) ×2
PACK EP LF (CUSTOM PROCEDURE TRAY) ×1 IMPLANT
PAD PRO RADIOLUCENT 2001M-C (PAD) ×2 IMPLANT
PATCH CARTO3 (PAD) ×2 IMPLANT
PENTARAY F 2-6-2MM (CATHETERS) ×2
SHEATH PINNACLE 7F 10CM (SHEATH) ×4 IMPLANT
SHEATH PINNACLE 9F 10CM (SHEATH) ×2 IMPLANT
SHEATH PROBE COVER 6X72 (BAG) ×2 IMPLANT
SHEATH SWARTZ TS SL2 63CM 8.5F (SHEATH) ×2 IMPLANT
TUBING SMART ABLATE COOLFLOW (TUBING) ×2 IMPLANT

## 2020-04-19 NOTE — Transfer of Care (Signed)
Immediate Anesthesia Transfer of Care Note  Patient: [name]  Procedure(s) Performed: ATRIAL FIBRILLATION ABLATION (N/A )  Patient Location: PACU and Cath Lab  Anesthesia Type:[name]  Level of Consciousness: awake, patient cooperative and responds to stimulation  Airway & Oxygen Therapy: Patient Spontanous Breathing and Patient connected to nasal cannula oxygen  Post-op Assessment: Report given to RN and Post -op Vital signs reviewed and stable  Post vital signs: Reviewed and stable  Last Vitals:  Vitals Value Taken Time  BP    Temp    Pulse    Resp    SpO2      Last Pain:  Vitals:   04/19/20 0602  TempSrc: Oral  PainSc: 0-No pain         Complications: No complications documented.

## 2020-04-19 NOTE — Anesthesia Postprocedure Evaluation (Signed)
Anesthesia Post Note  Patient: [name]  Procedure(s) Performed: ATRIAL FIBRILLATION ABLATION (N/A )     Patient location during evaluation: PACU Anesthesia Type: General Level of consciousness: awake and alert Pain management: pain level controlled Vital Signs Assessment: post-procedure vital signs reviewed and stable Respiratory status: spontaneous breathing, nonlabored ventilation, respiratory function stable and patient connected to nasal cannula oxygen Cardiovascular status: blood pressure returned to baseline and stable Postop Assessment: no apparent nausea or vomiting Anesthetic complications: no   No complications documented.  Last Vitals:  Vitals:   04/19/20 1115 04/19/20 1130  BP: (!) 120/52 117/65  Pulse: 68 69  Resp: 19 19  Temp:    SpO2: 97% 98%    Last Pain:  Vitals:   04/19/20 1034  TempSrc:   PainSc: 0-No pain                 Alvie Fowles L Tashai Catino

## 2020-04-19 NOTE — Anesthesia Preprocedure Evaluation (Addendum)
Anesthesia Evaluation  Patient identified by MRN, date of birth, ID band Patient awake    Reviewed: Allergy & Precautions, NPO status , Patient's Chart, lab work & pertinent test results, reviewed documented beta blocker date and time   Airway Mallampati: II  TM Distance: >3 FB Neck ROM: Full    Dental  (+) Edentulous Upper, Dental Advisory Given   Pulmonary asthma , sleep apnea (does not use CPAP) , COPD,  COPD inhaler, former smoker,    Pulmonary exam normal breath sounds clear to auscultation       Cardiovascular hypertension, Pt. on home beta blockers and Pt. on medications + CAD and +CHF  + dysrhythmias Atrial Fibrillation + pacemaker  Rhythm:Irregular Rate:Normal  TTE 2021 1. Left ventricular ejection fraction, by estimation, is 60 to 65%. The  left ventricle has normal function. The left ventricle has no regional  wall motion abnormalities. There is mild left ventricular hypertrophy.  Left ventricular diastolic parameters  are indeterminate.  2. Pacing wires in RA/RV. Right ventricular systolic function is normal.  The right ventricular size is normal. There is severely elevated pulmonary  artery systolic pressure.  3. Left atrial size was moderately dilated.  4. The mitral valve is normal in structure. Mild mitral valve  regurgitation. No evidence of mitral stenosis.  5. Tricuspid valve regurgitation is mild to moderate.  6. Normal appearing bioprosthetic AVR with No PvL. The aortic valve has  been repaired/replaced. Aortic valve regurgitation is not visualized. No  aortic stenosis is present.  7. The inferior vena cava is normal in size with greater than 50%  respiratory variability, suggesting right atrial pressure of 3 mmHg.  Cath 2017 1. 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 Mildly elevated left sided pressures with prominent v-waves 3. PA sat is low but calculated  Fick CO is within normal range. 4. Resting hypoxemia     Neuro/Psych negative neurological ROS  negative psych ROS   GI/Hepatic Neg liver ROS, GERD  ,  Endo/Other  diabetes, Oral Hypoglycemic AgentsHypothyroidism   Renal/GU Renal InsufficiencyRenal disease  negative genitourinary   Musculoskeletal  (+) Arthritis , Osteoarthritis,  Fibromyalgia -, narcotic dependent  Abdominal   Peds  Hematology  (+) Blood dyscrasia (on eliquis), ,   Anesthesia Other Findings   Reproductive/Obstetrics                          Anesthesia Physical Anesthesia Plan  ASA: III  Anesthesia Plan: General   Post-op Pain Management:    Induction: Intravenous  PONV Risk Score and Plan: 2 and Midazolam, Dexamethasone and Ondansetron  Airway Management Planned: Oral ETT  Additional Equipment:   Intra-op Plan:   Post-operative Plan: Extubation in OR  Informed Consent: I have reviewed the patients History and Physical, chart, labs and discussed the procedure including the risks, benefits and alternatives for the proposed anesthesia with the patient or authorized representative who has indicated his/her understanding and acceptance.     Dental advisory given  Plan Discussed with: CRNA  Anesthesia Plan Comments:         Anesthesia Quick Evaluation

## 2020-04-19 NOTE — Progress Notes (Signed)
Pt ambulated without difficulty or bleeding.   Discharged home with wife who will drive and stay with pt x 24 hrs 

## 2020-04-19 NOTE — Discharge Instructions (Signed)
Post procedure care instructions No driving for 4 days. No lifting over 5 lbs for 1 week. No vigorous or sexual activity for 1 week. You may return to work/your usual activities on 04/26/2020. Keep procedure site clean & dry. If you notice increased pain, swelling, bleeding or pus, call/return!  You may shower after 24 hours, but no soaking in baths/hot tubs/pools for 1 week.      Cardiac Ablation, Care After  This sheet gives you information about how to care for yourself after your procedure. Your health care provider may also give you more specific instructions. If you have problems or questions, contact your health care provider. What can I expect after the procedure? After the procedure, it is common to have:  Bruising around your puncture site.  Tenderness around your puncture site.  Skipped heartbeats.  Tiredness (fatigue).  Follow these instructions at home: Puncture site care   Follow instructions from your health care provider about how to take care of your puncture site. Make sure you: ? If present, leave stitches (sutures), skin glue, or adhesive strips in place. These skin closures may need to stay in place for up to 2 weeks. If adhesive strip edges start to loosen and curl up, you may trim the loose edges. Do not remove adhesive strips completely unless your health care provider tells you to do that. ? If a large square bandage is present, this may be removed 24 hours after surgery.   Check your puncture site every day for signs of infection. Check for: ? Redness, swelling, or pain. ? Fluid or blood. If your puncture site starts to bleed, lie down on your back, apply firm pressure to the area, and contact your health care provider. ? Warmth. ? Pus or a bad smell. Driving  Do not drive for at least 4 days after your procedure or however long your health care provider recommends. (Do not resume driving if you have previously been instructed not to drive for other health  reasons.)  Do not drive or use heavy machinery while taking prescription pain medicine. Activity  Avoid activities that take a lot of effort for at least 7 days after your procedure.  Do not lift anything that is heavier than 5 lb (4.5 kg) for one week.   No sexual activity for 1 week.   Return to your normal activities as told by your health care provider. Ask your health care provider what activities are safe for you. General instructions  Take over-the-counter and prescription medicines only as told by your health care provider.  Do not use any products that contain nicotine or tobacco, such as cigarettes and e-cigarettes. If you need help quitting, ask your health care provider.  You may shower after 24 hours, but Do not take baths, swim, or use a hot tub for 1 week.   Do not drink alcohol for 24 hours after your procedure.  Keep all follow-up visits as told by your health care provider. This is important. Contact a health care provider if:  You have redness, mild swelling, or pain around your puncture site.  You have fluid or blood coming from your puncture site that stops after applying firm pressure to the area.  Your puncture site feels warm to the touch.  You have pus or a bad smell coming from your puncture site.  You have a fever.  You have chest pain or discomfort that spreads to your neck, jaw, or arm.  You are sweating a lot.  You feel nauseous.  You have a fast or irregular heartbeat.  You have shortness of breath.  You are dizzy or light-headed and feel the need to lie down.  You have pain or numbness in the arm or leg closest to your puncture site. Get help right away if:  Your puncture site suddenly swells.  Your puncture site is bleeding and the bleeding does not stop after applying firm pressure to the area. These symptoms may represent a serious problem that is an emergency. Do not wait to see if the symptoms will go away. Get medical help  right away. Call your local emergency services (911 in the [place] Do not drive yourself to the hospital. Summary  After the procedure, it is normal to have bruising and tenderness at the puncture site in your groin, neck, or forearm.  Check your puncture site every day for signs of infection.  Get help right away if your puncture site is bleeding and the bleeding does not stop after applying firm pressure to the area. This is a medical emergency. This information is not intended to replace advice given to you by your health care provider. Make sure you discuss any questions you have with your health care provider.   You have an appointment set up with the [facility].  Multiple studies have shown that being followed by a dedicated atrial fibrillation clinic in addition to the standard care you receive from your other physicians improves health. We believe that enrollment in the atrial fibrillation clinic will allow [place] to better care for you.   The phone number to the [facility] is [phone]. The clinic is staffed Monday through Friday from 8:30am to 5pm.  Parking Directions: The clinic is located in the Heart and Vascular Building connected to [facility] 1)From [address] turn on to [address] and go to the 3rd entrance  (Heart and Vascular entrance) on the right. 2)Look to the right for Heart &Vascular Parking Garage. 3)A code for the entrance is required, for January is 1111.   4)Take the elevators to the 1st floor. Registration is in the room with the glass walls at the end of the hallway.  If you have any trouble parking or locating the clinic, please don't hesitate to call [phone].

## 2020-04-19 NOTE — Interval H&P Note (Signed)
History and Physical Interval Note:  04/19/2020 7:23 AM  [name]  has presented today for surgery, with the diagnosis of afib.  The various methods of treatment have been discussed with the patient and family. After consideration of risks, benefits and other options for treatment, the patient has consented to  Procedure(s): ATRIAL FIBRILLATION ABLATION (N/A) as a surgical intervention.  The patient's history has been reviewed, patient examined, no change in status, stable for surgery.  I have reviewed the patient's chart and labs.  Questions were answered to the patient's satisfaction.    He reports compliance with eliquis without interruption. Sp prior LAA clip  Thompson Grayer

## 2020-04-27 NOTE — Progress Notes (Signed)
Remote pacemaker transmission.   

## 2020-05-10 ENCOUNTER — Other Ambulatory Visit (HOSPITAL_COMMUNITY): Payer: Self-pay | Admitting: *Deleted

## 2020-05-10 ENCOUNTER — Telehealth (HOSPITAL_COMMUNITY): Payer: Self-pay | Admitting: Pharmacy Technician

## 2020-05-10 MED ORDER — DOFETILIDE 250 MCG PO CAPS: 250.0000 ug | ORAL_CAPSULE | Freq: Two times a day (BID) | ORAL | 1 refills | Status: DC

## 2020-05-10 NOTE — Telephone Encounter (Signed)
Spoke with patient's wife who explained they are having a hard time affording Tikosyn. Inquired into a tier exception. I explained that they could get a better price from [facility] with a goodrx card. She said she was able to get a 3 month supply for about $40.  Advised her to call me in the future with any issues.  Charlann Boxer, CPhT

## 2020-05-15 ENCOUNTER — Ambulatory Visit (INDEPENDENT_AMBULATORY_CARE_PROVIDER_SITE_OTHER): Payer: Medicare Other

## 2020-05-15 DIAGNOSIS — I4819 Other persistent atrial fibrillation: Secondary | ICD-10-CM

## 2020-05-15 LAB — CUP PACEART REMOTE DEVICE CHECK
Battery Remaining Longevity: 1 mo
Battery Remaining Percentage: 0.5 %
Battery Voltage: 2.6 V
Brady Statistic AP VP Percent: 54 %
Brady Statistic AP VS Percent: 1 %
Brady Statistic AS VP Percent: 46 %
Brady Statistic AS VS Percent: 1 %
Brady Statistic RA Percent Paced: 4.5 %
Brady Statistic RV Percent Paced: 99 %
Date Time Interrogation Session: 20220117020501
Implantable Lead Implant Date: 20131203
Implantable Lead Implant Date: 20131203
Implantable Lead Location: 753859
Implantable Lead Location: 753860
Implantable Lead Model: 1948
Implantable Pulse Generator Implant Date: 20131203
Lead Channel Impedance Value: 430 Ohm
Lead Channel Impedance Value: 640 Ohm
Lead Channel Pacing Threshold Amplitude: 0.75 V
Lead Channel Pacing Threshold Amplitude: 0.75 V
Lead Channel Pacing Threshold Pulse Width: 0.5 ms
Lead Channel Pacing Threshold Pulse Width: 0.8 ms
Lead Channel Sensing Intrinsic Amplitude: 3.8 mV
Lead Channel Sensing Intrinsic Amplitude: 6.9 mV
Lead Channel Setting Pacing Amplitude: 1 V
Lead Channel Setting Pacing Amplitude: 1.75 V
Lead Channel Setting Pacing Pulse Width: 0.8 ms
Lead Channel Setting Sensing Sensitivity: 4 mV
Pulse Gen Model: 2210
Pulse Gen Serial Number: 7408461

## 2020-05-17 ENCOUNTER — Other Ambulatory Visit: Payer: Self-pay

## 2020-05-17 ENCOUNTER — Encounter (HOSPITAL_COMMUNITY): Payer: Self-pay | Admitting: Nurse Practitioner

## 2020-05-17 ENCOUNTER — Ambulatory Visit (HOSPITAL_COMMUNITY)
Admission: RE | Admit: 2020-05-17 | Discharge: 2020-05-17 | Disposition: A | Discharge status: Home / Self Care | Payer: Medicare Other | Source: Ambulatory Visit | Attending: Nurse Practitioner | Admitting: Nurse Practitioner

## 2020-05-17 VITALS — BP 118/72 | HR 70 | Ht 71.0 in | Wt 195.6 lb

## 2020-05-17 DIAGNOSIS — Z20822 Contact with and (suspected) exposure to covid-19: Secondary | ICD-10-CM | POA: Diagnosis not present

## 2020-05-17 DIAGNOSIS — Z01818 Encounter for other preprocedural examination: Secondary | ICD-10-CM | POA: Insufficient documentation

## 2020-05-17 DIAGNOSIS — I4819 Other persistent atrial fibrillation: Secondary | ICD-10-CM | POA: Diagnosis not present

## 2020-05-17 DIAGNOSIS — D6869 Other thrombophilia: Secondary | ICD-10-CM | POA: Diagnosis not present

## 2020-05-17 DIAGNOSIS — I4891 Unspecified atrial fibrillation: Secondary | ICD-10-CM | POA: Insufficient documentation

## 2020-05-17 LAB — CBC
HCT: 48.4 % (ref 39.0–52.0)
Hemoglobin: 14.7 g/dL (ref 13.0–17.0)
MCH: 29.3 pg (ref 26.0–34.0)
MCHC: 30.4 g/dL (ref 30.0–36.0)
MCV: 96.4 fL (ref 80.0–100.0)
Platelets: 129 10*3/uL — ABNORMAL LOW (ref 150–400)
RBC: 5.02 MIL/uL (ref 4.22–5.81)
RDW: 14.3 % (ref 11.5–15.5)
WBC: 8.4 10*3/uL (ref 4.0–10.5)
nRBC: 0 % (ref 0.0–0.2)

## 2020-05-17 LAB — MAGNESIUM: Magnesium: 2.4 mg/dL (ref 1.7–2.4)

## 2020-05-17 LAB — BASIC METABOLIC PANEL
Anion gap: 11 (ref 5–15)
BUN: 32 mg/dL — ABNORMAL HIGH (ref 8–23)
CO2: 27 mmol/L (ref 22–32)
Calcium: 9.5 mg/dL (ref 8.9–10.3)
Chloride: 99 mmol/L (ref 98–111)
Creatinine, Ser: 2.11 mg/dL — ABNORMAL HIGH (ref 0.61–1.24)
GFR, Estimated: 33 mL/min — ABNORMAL LOW (ref >60)
Glucose, Bld: 146 mg/dL — ABNORMAL HIGH (ref 70–99)
Potassium: 4.1 mmol/L (ref 3.5–5.1)
Sodium: 137 mmol/L (ref 135–145)

## 2020-05-17 NOTE — Patient Instructions (Addendum)
Cardioversion scheduled for Thursday, January 27th  - Arrive at the [facility] and go to admitting at [facility] not eat or drink anything after midnight the night prior to your procedure.  - Take all your morning medication (except diabetic medications) with a sip of water prior to arrival.  - You will not be able to drive home after your procedure.  - Do NOT miss any doses of your blood thinner - if you should miss a dose please notify our office immediately.  - If you feel as if you go back into normal rhythm prior to scheduled cardioversion, please notify our office immediately. If your procedure is canceled in the cardioversion suite you will be charged a cancellation fee.

## 2020-05-17 NOTE — Progress Notes (Signed)
Primary Care Physician: Velna Hatchet, MD Referring Physician  Dr. Rayann Heman  AHF: Dr. Virgina Jock is a 70 y.o. male with a h/o advanced COPD on home O2, with frequent flareup,A. fib on Tikosyn and systemic anticoagulation, IIDM. Patient presented to the ED on 6/19/21with increasing short of breath,wheezing, cough and poor oral intake for 3 days. Denies any fever chills.   Patient came to ED a day earlier for same acute issue and was discharged with p.o. steroid.His symptoms persisted and he was also not able to take his regular medications including Tikosyn. He returned back to ED on 6/19.   He had missed a couple of days of his Tikosyn as he felt nauseated and could not take his meds.He was in  afib.  EP saw pt in the hospital and restarted tikosyn He underwent successful cardioversion on 10/18/17. For his COPD he had po doxycycline in the hospital as well as IV steroids.   In the afib clinic, 10/28/19,   he was in [place] and feels improved. His breathing is also improved.  He is a sensed, v paced today. Being compliant with his meds.   F/u in afib clinic, 02/18/20. Per device clinic he has been in persistent  afib since 01/12/20. He was hospitalized 9/24 for COPD exacerbation. He was diuresed with lasix.  He continues on Tikosyn 250 mcg bid. He has not missed any  eliquis 5 mg bid for at the least 3 weeks. No Vaccines. CHA2DS2VASc score of at least 5. Wearing O2 via continuous [place].   F/u in afib clinic, 03/16/20. He saw Dr. Haroldine Laws 03/14/20 and was found to be back in afib.  Interrogation showed he had  SR for around 5 hours after CV, 10/29. C/o fatigue in afib,  he does not feel well out of rhythm. Ekg shows v paced at 70 bpm.   He is now f/u in afib clinic, 1/19 /21,  for afib ablation that Dr. Rayann Heman  performed 04/19/20. Unfortunately, he is v paced today and interrogation shows that he went back into afib 12/24. He is feeling fatigued. He is also getting close to ERI and  paceart has been moved up to monthly checks.   Today, he denies symptoms of palpitations, chest pain, shortness of breath, orthopnea, PND, lower extremity edema, dizziness, presyncope, syncope, or neurologic sequela. + for fatigue. The patient is tolerating medications without difficulties and is otherwise without complaint today.   Past Medical History:  Diagnosis Date  . Aortic stenosis, mild   . Arthritis of hand    "just a little bit in both hands" (03/31/2012)  . Asthma    "little bit" (03/31/2012)  . Atrial fibrillation [facility])    dx '04; DCCV '04, placed on flecainide, failed DCCV 04/2010, flecainide stopped; s/p successful A.fib ablation 01/31/12  . CHF (congestive heart failure) ([place])   . Controlled type 2 diabetes mellitus without complication, without long-term current use of insulin ([place]) 12/13/2019  . COPD (chronic obstructive pulmonary disease) ([place])   . DJD (degenerative joint disease)   . Fibromyalgia   . GERD (gastroesophageal reflux disease)   . Hyperlipidemia   . Hypertension   . Hypothyroidism    S/P radiation  . Left atrial enlargement    LA size 83mm by echo 11/21/11  . Mitral regurgitation    trivial  . Obstructive sleep apnea    mild by sleep study 2013; pt stated he does not have a machine because "it wasn't bad enough  for him to have one"  . Pacemaker 03/31/2012  . Restless leg syndrome   . Second degree AV block   . Splenomegaly   . Visit for monitoring Tikosyn therapy 04/16/2016   Past Surgical History:  Procedure Laterality Date  . AORTIC VALVE REPLACEMENT N/A 08/22/2015   Procedure: AORTIC VALVE REPLACEMENT (AVR);  Surgeon: Ivin Poot, MD;  Location: [place];  Service: Open Heart Surgery;  Laterality: N/A;  . ATRIAL FIBRILLATION ABLATION  01/30/2012   PVI by Dr. Rayann Heman  . ATRIAL FIBRILLATION ABLATION N/A 01/31/2012   Procedure: ATRIAL FIBRILLATION ABLATION;  Surgeon: Thompson Grayer, MD;  Location: [facility] CATH LAB;  Service: Cardiovascular;  Laterality: N/A;   . ATRIAL FIBRILLATION ABLATION N/A 04/19/2020   Procedure: ATRIAL FIBRILLATION ABLATION;  Surgeon: Thompson Grayer, MD;  Location: [place] CV LAB;  Service: Cardiovascular;  Laterality: N/A;  . BACK SURGERY     X 3  . CARDIAC CATHETERIZATION N/A 08/09/2015   Procedure: Right/Left Heart Cath and Coronary Angiography;  Surgeon: Peter M Martinique, MD;  Location: [place] CV LAB;  Service: Cardiovascular;  Laterality: N/A;  . CARDIAC CATHETERIZATION N/A 02/05/2016   Procedure: Right/Left Heart Cath and Coronary Angiography;  Surgeon: Jettie Booze, MD;  Location: [place] CV LAB;  Service: Cardiovascular;  Laterality: N/A;  . CARDIAC CATHETERIZATION N/A 04/17/2016   Procedure: Right Heart Cath;  Surgeon: Jolaine Artist, MD;  Location: [place] CV LAB;  Service: Cardiovascular;  Laterality: N/A;  . CARDIOVASCULAR STRESS TEST  03/12/2002   EF 48%, NO EVIDENCE OF ISCHEMIA  . CARDIOVERSION  01/2012; 03/31/2012  . CARDIOVERSION N/A 02/25/2012   Procedure: CARDIOVERSION;  Surgeon: Thompson Grayer, MD;  Location: [facility] CATH LAB;  Service: Cardiovascular;  Laterality: N/A;  . CARDIOVERSION N/A 03/31/2012   Procedure: CARDIOVERSION;  Surgeon: Thompson Grayer, MD;  Location: [facility] CATH LAB;  Service: Cardiovascular;  Laterality: N/A;  . CARDIOVERSION N/A 11/23/2015   Procedure: CARDIOVERSION;  Surgeon: Larey Dresser, MD;  Location: [place];  Service: Cardiovascular;  Laterality: N/A;  . CARDIOVERSION N/A 04/08/2016   Procedure: CARDIOVERSION;  Surgeon: Jolaine Artist, MD;  Location: [facility] ENDOSCOPY;  Service: Cardiovascular;  Laterality: N/A;  . CARDIOVERSION N/A 09/14/2018   Procedure: CARDIOVERSION;  Surgeon: Josue Hector, MD;  Location: [facility] ENDOSCOPY;  Service: Cardiovascular;  Laterality: N/A;  . CARDIOVERSION N/A 10/19/2019   Procedure: CARDIOVERSION;  Surgeon: Jolaine Artist, MD;  Location: [facility] ENDOSCOPY;  Service: Cardiovascular;  Laterality: N/A;  . CARDIOVERSION N/A 02/25/2020    Procedure: CARDIOVERSION;  Surgeon: Fay Records, MD;  Location: [place];  Service: Cardiovascular;  Laterality: N/A;  . CLIPPING OF ATRIAL APPENDAGE N/A 08/22/2015   Procedure: CLIPPING OF ATRIAL APPENDAGE;  Surgeon: Ivin Poot, MD;  Location: [place];  Service: Open Heart Surgery;  Laterality: N/A;  . DOPPLER ECHOCARDIOGRAPHY  03/11/2002   EF 70-75%  . FINGER SURGERY Left    Middle finger  . FINGER TENDON REPAIR  1980's   "right little finger" (03/31/2012)  . INSERT / REPLACE / REMOVE PACEMAKER     [employer]  . IR RADIOLOGIST EVAL & MGMT  05/06/2019  . IR RADIOLOGIST EVAL & MGMT  10/06/2019  . LEFT AND RIGHT HEART CATHETERIZATION WITH CORONARY ANGIOGRAM N/A 10/27/2012   Procedure: LEFT AND RIGHT HEART CATHETERIZATION WITH CORONARY ANGIOGRAM;  Surgeon: Laverda Page, MD;  Location: [facility] CATH LAB;  Service: Cardiovascular;  Laterality: N/A;  . LUMBAR DISC SURGERY  1980's X2;  2000's  .  MAZE N/A 08/22/2015   Procedure: MAZE;  Surgeon: Ivin Poot, MD;  Location: [place];  Service: Open Heart Surgery;  Laterality: N/A;  . PACEMAKER INSERTION  03/31/2012   STJ Accent DR pacemaker implanted by Dr Rayann Heman  . PERMANENT PACEMAKER INSERTION N/A 03/31/2012   Procedure: PERMANENT PACEMAKER INSERTION;  Surgeon: Thompson Grayer, MD;  Location: [facility] CATH LAB;  Service: Cardiovascular;  Laterality: N/A;  . TEE WITHOUT CARDIOVERSION  01/30/2012   Procedure: TRANSESOPHAGEAL ECHOCARDIOGRAM (TEE);  Surgeon: Larey Dresser, MD;  Location: [place];  Service: Cardiovascular;  Laterality: N/A;  ablation next day  . TEE WITHOUT CARDIOVERSION N/A 08/22/2015   Procedure: TRANSESOPHAGEAL ECHOCARDIOGRAM (TEE);  Surgeon: Ivin Poot, MD;  Location: [place];  Service: Open Heart Surgery;  Laterality: N/A;  . TEE WITHOUT CARDIOVERSION N/A 10/19/2019   Procedure: TRANSESOPHAGEAL ECHOCARDIOGRAM (TEE);  Surgeon: Jolaine Artist, MD;  Location: [facility] ENDOSCOPY;  Service: Cardiovascular;  Laterality: N/A;  . US  ECHOCARDIOGRAPHY  08/07/2009   EF 55-60%    Current Outpatient Medications  Medication Sig Dispense Refill  . albuterol (PROVENTIL HFA;VENTOLIN HFA) 108 (90 Base) MCG/ACT inhaler Inhale 2 puffs into the lungs every 6 (six) hours as needed for wheezing or shortness of breath. 1 Inhaler 5  . amLODipine (NORVASC) 10 MG tablet Take 0.5 tablets (5 mg total) by mouth daily. 30 tablet 11  . apixaban (ELIQUIS) 5 MG TABS tablet Take 5 mg by mouth 2 (two) times daily.    . bisoprolol (ZEBETA) 5 MG tablet Take 5 mg by mouth daily.    . Budeson-Glycopyrrol-Formoterol (BREZTRI AEROSPHERE) 160-9-4.8 MCG/ACT AERO Inhale 2 puffs into the lungs in the morning and at bedtime. 5.9 g 0  . Calcium-Magnesium-Zinc (CAL-MAG-ZINC PO) Take 1 tablet by mouth every evening.    . Cholecalciferol (VITAMIN D) 2000 UNITS CAPS Take 8,000 Units by mouth at bedtime.     . dofetilide (TIKOSYN) 250 MCG capsule Take 1 capsule (250 mcg total) by mouth 2 (two) times daily. 180 capsule 1  . empagliflozin (JARDIANCE) 10 MG TABS tablet Take 1 tablet (10 mg total) by mouth daily before breakfast. 30 tablet 6  . fexofenadine (ALLEGRA) 180 MG tablet Take 1 tablet (180 mg total) by mouth daily as needed for allergies or rhinitis.    [name] gabapentin (NEURONTIN) 300 MG capsule Take 600-1,200 mg by mouth See admin instructions. Take 600 mg by mouth in the morning, 600 mg at lunchtime, and take 1200 mg at bedtime  3  . guaifenesin (HUMIBID E) 400 MG TABS tablet Take 800 mg by mouth every morning.    [name] levothyroxine (SYNTHROID, LEVOTHROID) 175 MCG tablet Take 175 mcg by mouth daily before breakfast.    . Multiple Vitamin (MULTIVITAMIN WITH MINERALS) TABS tablet Take 1 tablet by mouth daily. 30 tablet 0  . ondansetron (ZOFRAN-ODT) 4 MG disintegrating tablet Take 4 mg by mouth every 8 (eight) hours as needed for nausea or vomiting.    [name] oxycodone (OXY-IR) 5 MG capsule Take 5 mg by mouth every 4 (four) hours as needed.    . OXYGEN Inhale 2 L into the  lungs continuous.    . pantoprazole (PROTONIX) 40 MG tablet Take 40 mg by mouth daily.    . polyethylene glycol powder (GLYCOLAX/MIRALAX) 17 GM/SCOOP powder Take 17 g by mouth daily as needed for mild constipation.    . Polyvinyl Alcohol-Povidone (REFRESH OP) Place 1 drop into both eyes daily as needed (dry eyes).    . potassium chloride (  KLOR-CON) 10 MEQ tablet Take 20 mEq by mouth See admin instructions. Take 20 meq twice every other day (take only on torsemide days)    . predniSONE (DELTASONE) 5 MG tablet Take 1 tablet (5 mg total) by mouth daily with breakfast. 30 tablet 3  . protein supplement shake (PREMIER PROTEIN) LIQD Take 11 oz by mouth daily.    [name] rOPINIRole (REQUIP) 1 MG tablet Take 1 tablet (1 mg total) by mouth in the morning and at bedtime.    . rosuvastatin (CRESTOR) 10 MG tablet Take 10 mg by mouth at bedtime.     [name] spironolactone (ALDACTONE) 25 MG tablet Take 1 tablet (25 mg total) by mouth daily. 90 tablet 3  . torsemide (DEMADEX) 20 MG tablet Take 40 mg by mouth every other day.    . traMADol-acetaminophen (ULTRACET) 37.5-325 MG tablet Take 2 tablets by mouth 2 (two) times daily.     No current facility-administered medications for this encounter.    Allergies  Allergen Reactions  . Meloxicam Rash  . 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 syndrome 09/02/15, resolved with diphenhydramine and slowing of rate    Social History   Socioeconomic History  . Marital status: Married    Spouse name: Not on file  . Number of children: Not on file  . Years of education: Not on file  . Highest education level: Not on file  Occupational History  . Occupation: retired  Tobacco Use  . Smoking status: Former Smoker    Packs/day: 1.00    Years: 25.00    Pack years: 25.00    Types: Cigarettes    Quit date: 07/18/1991    Years since quitting: 28.8  . Smokeless tobacco: Never Used  Vaping Use  . Vaping Use: Never used  Substance and Sexual Activity  . Alcohol use: Not  Currently    Comment: occasional  . Drug use: Yes    Types: Marijuana    Comment: rarely use  . Sexual activity: Not Currently  Other Topics Concern  . Not on file  Social History Narrative   Lives in [place] with spouse.  Works as a Air traffic controller.   Social Determinants of Health   Financial Resource Strain: Not on file  Food Insecurity: Not on file  Transportation Needs: Not on file  Physical Activity: Not on file  Stress: Not on file  Social Connections: Not on file  Intimate Partner Violence: Not on file    Family History  Problem Relation Age of Onset  . Heart failure Mother   . Stroke Father     ROS- All systems are reviewed and negative except as per the HPI above  Physical Exam: Vitals:   05/17/20 1140  BP: 118/72  Pulse: 70  Weight: 88.7 kg  Height: 5\' 11"  (1.803 m)   Wt Readings from Last 3 Encounters:  05/17/20 88.7 kg  04/19/20 89.8 kg  04/12/20 94.8 kg    Labs: Lab Results  Component Value Date   NA 137 05/17/2020   K 4.1 05/17/2020   CL 99 05/17/2020   CO2 27 05/17/2020   GLUCOSE 146 (H) 05/17/2020   BUN 32 (H) 05/17/2020   CREATININE 2.11 (H) 05/17/2020   CALCIUM 9.5 05/17/2020   PHOS 4.3 12/14/2019   MG 2.4 05/17/2020   Lab Results  Component Value Date   INR 1.6 (H) 10/18/2019   Lab Results  Component Value Date   CHOL 114 (L) 08/03/2015   HDL  30 (L) 08/03/2015   LDLCALC 62 08/03/2015   TRIG 111 08/03/2015     GEN- The patient is well appearing, alert and oriented x 3 today.   Head- normocephalic, atraumatic Eyes-  Sclera clear, conjunctiva pink Ears- hearing intact Oropharynx- clear Neck- supple, no JVP Lymph- no cervical lymphadenopathy Lungs- Clear to ausculation bilaterally, normal work of breathing Heart- irregular rate and rhythm, no murmurs, rubs or gallops, PMI not laterally displaced GI- soft, NT, ND, + BS Extremities- no clubbing, cyanosis, or edema MS- no significant deformity or atrophy Skin- no  rash or lesion Psych- euthymic mood, full affect Neuro- strength and sensation are intact  EKG-v paced rhythm at 70 bpm, qrs int 108 ms, qtc 410 ms  St jude interrogation shows persistent afib since 12/24.     Assessment and Plan: 1. Persistent  afib Recent breakthrough with COPD exacerbation in September and persists to present Cardioversion 10/29 with ERAF  Discussed options with pt, he appears that he is failing tikosyn to maintain SR With his significant lung disease,  amiodarone would not  be a good oprion He  has had an ablation in 2013 and a Maze in 2017  He is now s/p second ablation 04/18/20 with return of afib 12/24 Will plan for cardioversion  Continue tikosyn at 250 mcg bid  Continue bisoprolol at 5 mg bid  No covid vaccines  Cbc/bmet/mag/covid   2. CHA2DS2VASc score of at least 4 Continue eliquis 5 mg bid  States no missed doses x 3 weeks   3.COPD Continuous  O2 via nasal cannula    4. HF Weight stable   F/u here after CV, Dr. Rayann Heman,  3/16  Butch Penny C. Shaasia Odle, [facility] [address] [place], [place] [address] [phone]

## 2020-05-17 NOTE — Addendum Note (Signed)
Encounter addended by: Sherran Needs, NP on: 05/17/2020 2:00 PM  Actions taken: Level of Service modified

## 2020-05-17 NOTE — H&P (View-Only) (Signed)
Primary Care Physician: Velna Hatchet, MD Referring Physician  Dr. Rayann Heman  AHF: Dr. Virgina Jock is a 70 y.o. male with a h/o advanced COPD on home O2, with frequent flareup,A. fib on Tikosyn and systemic anticoagulation, IIDM. Patient presented to the ED on 6/19/21with increasing short of breath,wheezing, cough and poor oral intake for 3 days. Denies any fever chills.   Patient came to ED a day earlier for same acute issue and was discharged with p.o. steroid.His symptoms persisted and he was also not able to take his regular medications including Tikosyn. He returned back to ED on 6/19.   He had missed a couple of days of his Tikosyn as he felt nauseated and could not take his meds.He was in  afib.  EP saw pt in the hospital and restarted tikosyn He underwent successful cardioversion on 10/18/17. For his COPD he had po doxycycline in the hospital as well as IV steroids.   In the afib clinic, 10/28/19,   he was in [place] and feels improved. His breathing is also improved.  He is a sensed, v paced today. Being compliant with his meds.   F/u in afib clinic, 02/18/20. Per device clinic he has been in persistent  afib since 01/12/20. He was hospitalized 9/24 for COPD exacerbation. He was diuresed with lasix.  He continues on Tikosyn 250 mcg bid. He has not missed any  eliquis 5 mg bid for at the least 3 weeks. No Vaccines. CHA2DS2VASc score of at least 5. Wearing O2 via continuous [place].   F/u in afib clinic, 03/16/20. He saw Dr. Haroldine Laws 03/14/20 and was found to be back in afib.  Interrogation showed he had  SR for around 5 hours after CV, 10/29. C/o fatigue in afib,  he does not feel well out of rhythm. Ekg shows v paced at 70 bpm.   He is now f/u in afib clinic, 1/19 /21,  for afib ablation that Dr. Rayann Heman  performed 04/19/20. Unfortunately, he is v paced today and interrogation shows that he went back into afib 12/24. He is feeling fatigued. He is also getting close to ERI and  paceart has been moved up to monthly checks.   Today, he denies symptoms of palpitations, chest pain, shortness of breath, orthopnea, PND, lower extremity edema, dizziness, presyncope, syncope, or neurologic sequela. + for fatigue. The patient is tolerating medications without difficulties and is otherwise without complaint today.   Past Medical History:  Diagnosis Date  . Aortic stenosis, mild   . Arthritis of hand    "just a little bit in both hands" (03/31/2012)  . Asthma    "little bit" (03/31/2012)  . Atrial fibrillation [facility])    dx '04; DCCV '04, placed on flecainide, failed DCCV 04/2010, flecainide stopped; s/p successful A.fib ablation 01/31/12  . CHF (congestive heart failure) ([place])   . Controlled type 2 diabetes mellitus without complication, without long-term current use of insulin ([place]) 12/13/2019  . COPD (chronic obstructive pulmonary disease) ([place])   . DJD (degenerative joint disease)   . Fibromyalgia   . GERD (gastroesophageal reflux disease)   . Hyperlipidemia   . Hypertension   . Hypothyroidism    S/P radiation  . Left atrial enlargement    LA size 27mm by echo 11/21/11  . Mitral regurgitation    trivial  . Obstructive sleep apnea    mild by sleep study 2013; pt stated he does not have a machine because "it wasn't bad enough  for him to have one"  . Pacemaker 03/31/2012  . Restless leg syndrome   . Second degree AV block   . Splenomegaly   . Visit for monitoring Tikosyn therapy 04/16/2016   Past Surgical History:  Procedure Laterality Date  . AORTIC VALVE REPLACEMENT N/A 08/22/2015   Procedure: AORTIC VALVE REPLACEMENT (AVR);  Surgeon: Ivin Poot, MD;  Location: [place];  Service: Open Heart Surgery;  Laterality: N/A;  . ATRIAL FIBRILLATION ABLATION  01/30/2012   PVI by Dr. Rayann Heman  . ATRIAL FIBRILLATION ABLATION N/A 01/31/2012   Procedure: ATRIAL FIBRILLATION ABLATION;  Surgeon: Thompson Grayer, MD;  Location: [facility] CATH LAB;  Service: Cardiovascular;  Laterality: N/A;   . ATRIAL FIBRILLATION ABLATION N/A 04/19/2020   Procedure: ATRIAL FIBRILLATION ABLATION;  Surgeon: Thompson Grayer, MD;  Location: [place] CV LAB;  Service: Cardiovascular;  Laterality: N/A;  . BACK SURGERY     X 3  . CARDIAC CATHETERIZATION N/A 08/09/2015   Procedure: Right/Left Heart Cath and Coronary Angiography;  Surgeon: Peter M Martinique, MD;  Location: [place] CV LAB;  Service: Cardiovascular;  Laterality: N/A;  . CARDIAC CATHETERIZATION N/A 02/05/2016   Procedure: Right/Left Heart Cath and Coronary Angiography;  Surgeon: Jettie Booze, MD;  Location: [place] CV LAB;  Service: Cardiovascular;  Laterality: N/A;  . CARDIAC CATHETERIZATION N/A 04/17/2016   Procedure: Right Heart Cath;  Surgeon: Jolaine Artist, MD;  Location: [place] CV LAB;  Service: Cardiovascular;  Laterality: N/A;  . CARDIOVASCULAR STRESS TEST  03/12/2002   EF 48%, NO EVIDENCE OF ISCHEMIA  . CARDIOVERSION  01/2012; 03/31/2012  . CARDIOVERSION N/A 02/25/2012   Procedure: CARDIOVERSION;  Surgeon: Thompson Grayer, MD;  Location: [facility] CATH LAB;  Service: Cardiovascular;  Laterality: N/A;  . CARDIOVERSION N/A 03/31/2012   Procedure: CARDIOVERSION;  Surgeon: Thompson Grayer, MD;  Location: [facility] CATH LAB;  Service: Cardiovascular;  Laterality: N/A;  . CARDIOVERSION N/A 11/23/2015   Procedure: CARDIOVERSION;  Surgeon: Larey Dresser, MD;  Location: [place];  Service: Cardiovascular;  Laterality: N/A;  . CARDIOVERSION N/A 04/08/2016   Procedure: CARDIOVERSION;  Surgeon: Jolaine Artist, MD;  Location: [facility] ENDOSCOPY;  Service: Cardiovascular;  Laterality: N/A;  . CARDIOVERSION N/A 09/14/2018   Procedure: CARDIOVERSION;  Surgeon: Josue Hector, MD;  Location: [facility] ENDOSCOPY;  Service: Cardiovascular;  Laterality: N/A;  . CARDIOVERSION N/A 10/19/2019   Procedure: CARDIOVERSION;  Surgeon: Jolaine Artist, MD;  Location: [facility] ENDOSCOPY;  Service: Cardiovascular;  Laterality: N/A;  . CARDIOVERSION N/A 02/25/2020    Procedure: CARDIOVERSION;  Surgeon: Fay Records, MD;  Location: [place];  Service: Cardiovascular;  Laterality: N/A;  . CLIPPING OF ATRIAL APPENDAGE N/A 08/22/2015   Procedure: CLIPPING OF ATRIAL APPENDAGE;  Surgeon: Ivin Poot, MD;  Location: [place];  Service: Open Heart Surgery;  Laterality: N/A;  . DOPPLER ECHOCARDIOGRAPHY  03/11/2002   EF 70-75%  . FINGER SURGERY Left    Middle finger  . FINGER TENDON REPAIR  1980's   "right little finger" (03/31/2012)  . INSERT / REPLACE / REMOVE PACEMAKER     [employer]  . IR RADIOLOGIST EVAL & MGMT  05/06/2019  . IR RADIOLOGIST EVAL & MGMT  10/06/2019  . LEFT AND RIGHT HEART CATHETERIZATION WITH CORONARY ANGIOGRAM N/A 10/27/2012   Procedure: LEFT AND RIGHT HEART CATHETERIZATION WITH CORONARY ANGIOGRAM;  Surgeon: Laverda Page, MD;  Location: [facility] CATH LAB;  Service: Cardiovascular;  Laterality: N/A;  . LUMBAR DISC SURGERY  1980's X2;  2000's  .  MAZE N/A 08/22/2015   Procedure: MAZE;  Surgeon: Ivin Poot, MD;  Location: [place];  Service: Open Heart Surgery;  Laterality: N/A;  . PACEMAKER INSERTION  03/31/2012   STJ Accent DR pacemaker implanted by Dr Rayann Heman  . PERMANENT PACEMAKER INSERTION N/A 03/31/2012   Procedure: PERMANENT PACEMAKER INSERTION;  Surgeon: Thompson Grayer, MD;  Location: [facility] CATH LAB;  Service: Cardiovascular;  Laterality: N/A;  . TEE WITHOUT CARDIOVERSION  01/30/2012   Procedure: TRANSESOPHAGEAL ECHOCARDIOGRAM (TEE);  Surgeon: Larey Dresser, MD;  Location: [place];  Service: Cardiovascular;  Laterality: N/A;  ablation next day  . TEE WITHOUT CARDIOVERSION N/A 08/22/2015   Procedure: TRANSESOPHAGEAL ECHOCARDIOGRAM (TEE);  Surgeon: Ivin Poot, MD;  Location: [place];  Service: Open Heart Surgery;  Laterality: N/A;  . TEE WITHOUT CARDIOVERSION N/A 10/19/2019   Procedure: TRANSESOPHAGEAL ECHOCARDIOGRAM (TEE);  Surgeon: Jolaine Artist, MD;  Location: [facility] ENDOSCOPY;  Service: Cardiovascular;  Laterality: N/A;  . US  ECHOCARDIOGRAPHY  08/07/2009   EF 55-60%    Current Outpatient Medications  Medication Sig Dispense Refill  . albuterol (PROVENTIL HFA;VENTOLIN HFA) 108 (90 Base) MCG/ACT inhaler Inhale 2 puffs into the lungs every 6 (six) hours as needed for wheezing or shortness of breath. 1 Inhaler 5  . amLODipine (NORVASC) 10 MG tablet Take 0.5 tablets (5 mg total) by mouth daily. 30 tablet 11  . apixaban (ELIQUIS) 5 MG TABS tablet Take 5 mg by mouth 2 (two) times daily.    . bisoprolol (ZEBETA) 5 MG tablet Take 5 mg by mouth daily.    . Budeson-Glycopyrrol-Formoterol (BREZTRI AEROSPHERE) 160-9-4.8 MCG/ACT AERO Inhale 2 puffs into the lungs in the morning and at bedtime. 5.9 g 0  . Calcium-Magnesium-Zinc (CAL-MAG-ZINC PO) Take 1 tablet by mouth every evening.    . Cholecalciferol (VITAMIN D) 2000 UNITS CAPS Take 8,000 Units by mouth at bedtime.     . dofetilide (TIKOSYN) 250 MCG capsule Take 1 capsule (250 mcg total) by mouth 2 (two) times daily. 180 capsule 1  . empagliflozin (JARDIANCE) 10 MG TABS tablet Take 1 tablet (10 mg total) by mouth daily before breakfast. 30 tablet 6  . fexofenadine (ALLEGRA) 180 MG tablet Take 1 tablet (180 mg total) by mouth daily as needed for allergies or rhinitis.    [name] gabapentin (NEURONTIN) 300 MG capsule Take 600-1,200 mg by mouth See admin instructions. Take 600 mg by mouth in the morning, 600 mg at lunchtime, and take 1200 mg at bedtime  3  . guaifenesin (HUMIBID E) 400 MG TABS tablet Take 800 mg by mouth every morning.    [name] levothyroxine (SYNTHROID, LEVOTHROID) 175 MCG tablet Take 175 mcg by mouth daily before breakfast.    . Multiple Vitamin (MULTIVITAMIN WITH MINERALS) TABS tablet Take 1 tablet by mouth daily. 30 tablet 0  . ondansetron (ZOFRAN-ODT) 4 MG disintegrating tablet Take 4 mg by mouth every 8 (eight) hours as needed for nausea or vomiting.    [name] oxycodone (OXY-IR) 5 MG capsule Take 5 mg by mouth every 4 (four) hours as needed.    . OXYGEN Inhale 2 L into the  lungs continuous.    . pantoprazole (PROTONIX) 40 MG tablet Take 40 mg by mouth daily.    . polyethylene glycol powder (GLYCOLAX/MIRALAX) 17 GM/SCOOP powder Take 17 g by mouth daily as needed for mild constipation.    . Polyvinyl Alcohol-Povidone (REFRESH OP) Place 1 drop into both eyes daily as needed (dry eyes).    . potassium chloride (  KLOR-CON) 10 MEQ tablet Take 20 mEq by mouth See admin instructions. Take 20 meq twice every other day (take only on torsemide days)    . predniSONE (DELTASONE) 5 MG tablet Take 1 tablet (5 mg total) by mouth daily with breakfast. 30 tablet 3  . protein supplement shake (PREMIER PROTEIN) LIQD Take 11 oz by mouth daily.    [name] rOPINIRole (REQUIP) 1 MG tablet Take 1 tablet (1 mg total) by mouth in the morning and at bedtime.    . rosuvastatin (CRESTOR) 10 MG tablet Take 10 mg by mouth at bedtime.     [name] spironolactone (ALDACTONE) 25 MG tablet Take 1 tablet (25 mg total) by mouth daily. 90 tablet 3  . torsemide (DEMADEX) 20 MG tablet Take 40 mg by mouth every other day.    . traMADol-acetaminophen (ULTRACET) 37.5-325 MG tablet Take 2 tablets by mouth 2 (two) times daily.     No current facility-administered medications for this encounter.    Allergies  Allergen Reactions  . Meloxicam Rash  . 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 syndrome 09/02/15, resolved with diphenhydramine and slowing of rate    Social History   Socioeconomic History  . Marital status: Married    Spouse name: Not on file  . Number of children: Not on file  . Years of education: Not on file  . Highest education level: Not on file  Occupational History  . Occupation: retired  Tobacco Use  . Smoking status: Former Smoker    Packs/day: 1.00    Years: 25.00    Pack years: 25.00    Types: Cigarettes    Quit date: 07/18/1991    Years since quitting: 28.8  . Smokeless tobacco: Never Used  Vaping Use  . Vaping Use: Never used  Substance and Sexual Activity  . Alcohol use: Not  Currently    Comment: occasional  . Drug use: Yes    Types: Marijuana    Comment: rarely use  . Sexual activity: Not Currently  Other Topics Concern  . Not on file  Social History Narrative   Lives in [place] with spouse.  Works as a Air traffic controller.   Social Determinants of Health   Financial Resource Strain: Not on file  Food Insecurity: Not on file  Transportation Needs: Not on file  Physical Activity: Not on file  Stress: Not on file  Social Connections: Not on file  Intimate Partner Violence: Not on file    Family History  Problem Relation Age of Onset  . Heart failure Mother   . Stroke Father     ROS- All systems are reviewed and negative except as per the HPI above  Physical Exam: Vitals:   05/17/20 1140  BP: 118/72  Pulse: 70  Weight: 88.7 kg  Height: 5\' 11"  (1.803 m)   Wt Readings from Last 3 Encounters:  05/17/20 88.7 kg  04/19/20 89.8 kg  04/12/20 94.8 kg    Labs: Lab Results  Component Value Date   NA 137 05/17/2020   K 4.1 05/17/2020   CL 99 05/17/2020   CO2 27 05/17/2020   GLUCOSE 146 (H) 05/17/2020   BUN 32 (H) 05/17/2020   CREATININE 2.11 (H) 05/17/2020   CALCIUM 9.5 05/17/2020   PHOS 4.3 12/14/2019   MG 2.4 05/17/2020   Lab Results  Component Value Date   INR 1.6 (H) 10/18/2019   Lab Results  Component Value Date   CHOL 114 (L) 08/03/2015   HDL  30 (L) 08/03/2015   LDLCALC 62 08/03/2015   TRIG 111 08/03/2015     GEN- The patient is well appearing, alert and oriented x 3 today.   Head- normocephalic, atraumatic Eyes-  Sclera clear, conjunctiva pink Ears- hearing intact Oropharynx- clear Neck- supple, no JVP Lymph- no cervical lymphadenopathy Lungs- Clear to ausculation bilaterally, normal work of breathing Heart- irregular rate and rhythm, no murmurs, rubs or gallops, PMI not laterally displaced GI- soft, NT, ND, + BS Extremities- no clubbing, cyanosis, or edema MS- no significant deformity or atrophy Skin- no  rash or lesion Psych- euthymic mood, full affect Neuro- strength and sensation are intact  EKG-v paced rhythm at 70 bpm, qrs int 108 ms, qtc 410 ms  St jude interrogation shows persistent afib since 12/24.     Assessment and Plan: 1. Persistent  afib Recent breakthrough with COPD exacerbation in September and persists to present Cardioversion 10/29 with ERAF  Discussed options with pt, he appears that he is failing tikosyn to maintain SR With his significant lung disease,  amiodarone would not  be a good oprion He  has had an ablation in 2013 and a Maze in 2017  He is now s/p second ablation 04/18/20 with return of afib 12/24 Will plan for cardioversion  Continue tikosyn at 250 mcg bid  Continue bisoprolol at 5 mg bid  No covid vaccines  Cbc/bmet/mag/covid   2. CHA2DS2VASc score of at least 4 Continue eliquis 5 mg bid  States no missed doses x 3 weeks   3.COPD Continuous  O2 via nasal cannula    4. HF Weight stable   F/u here after CV, Dr. Rayann Heman,  3/16  [name], [facility] [address] [place], [place] [address] [phone]

## 2020-05-18 ENCOUNTER — Encounter (HOSPITAL_COMMUNITY): Payer: Self-pay

## 2020-05-18 ENCOUNTER — Encounter (HOSPITAL_COMMUNITY): Payer: Self-pay | Admitting: *Deleted

## 2020-05-22 ENCOUNTER — Other Ambulatory Visit (HOSPITAL_COMMUNITY)
Admission: RE | Admit: 2020-05-22 | Discharge: 2020-05-22 | Disposition: A | Discharge status: Home / Self Care | Payer: Medicare Other | Source: Ambulatory Visit | Attending: Internal Medicine | Admitting: Internal Medicine

## 2020-05-22 DIAGNOSIS — Z01812 Encounter for preprocedural laboratory examination: Secondary | ICD-10-CM | POA: Diagnosis not present

## 2020-05-22 DIAGNOSIS — Z20822 Contact with and (suspected) exposure to covid-19: Secondary | ICD-10-CM | POA: Insufficient documentation

## 2020-05-22 LAB — SARS CORONAVIRUS 2 (TAT 6-24 HRS): SARS Coronavirus 2: NEGATIVE

## 2020-05-23 ENCOUNTER — Other Ambulatory Visit (HOSPITAL_COMMUNITY): Payer: Medicare Other

## 2020-05-25 ENCOUNTER — Ambulatory Visit (HOSPITAL_COMMUNITY): Payer: Medicare Other | Admitting: Registered Nurse

## 2020-05-25 ENCOUNTER — Other Ambulatory Visit: Payer: Self-pay

## 2020-05-25 ENCOUNTER — Encounter (HOSPITAL_COMMUNITY)
Admission: RE | Disposition: A | Discharge status: Home / Self Care | Payer: Self-pay | Source: Home / Self Care | Attending: Cardiology

## 2020-05-25 ENCOUNTER — Encounter (HOSPITAL_COMMUNITY): Payer: Self-pay | Admitting: Cardiology

## 2020-05-25 ENCOUNTER — Ambulatory Visit (HOSPITAL_COMMUNITY)
Admission: RE | Admit: 2020-05-25 | Discharge: 2020-05-25 | Disposition: A | Discharge status: Home / Self Care | Payer: Medicare Other | Attending: Cardiology | Admitting: Cardiology

## 2020-05-25 DIAGNOSIS — I130 Hypertensive heart and chronic kidney disease with heart failure and stage 1 through stage 4 chronic kidney disease, or unspecified chronic kidney disease: Secondary | ICD-10-CM | POA: Diagnosis not present

## 2020-05-25 DIAGNOSIS — I509 Heart failure, unspecified: Secondary | ICD-10-CM | POA: Diagnosis not present

## 2020-05-25 DIAGNOSIS — I5033 Acute on chronic diastolic (congestive) heart failure: Secondary | ICD-10-CM | POA: Diagnosis not present

## 2020-05-25 DIAGNOSIS — Z87891 Personal history of nicotine dependence: Secondary | ICD-10-CM | POA: Insufficient documentation

## 2020-05-25 DIAGNOSIS — Z7901 Long term (current) use of anticoagulants: Secondary | ICD-10-CM | POA: Insufficient documentation

## 2020-05-25 DIAGNOSIS — Z79899 Other long term (current) drug therapy: Secondary | ICD-10-CM | POA: Insufficient documentation

## 2020-05-25 DIAGNOSIS — Z79890 Hormone replacement therapy (postmenopausal): Secondary | ICD-10-CM | POA: Diagnosis not present

## 2020-05-25 DIAGNOSIS — I4819 Other persistent atrial fibrillation: Secondary | ICD-10-CM | POA: Diagnosis not present

## 2020-05-25 DIAGNOSIS — J449 Chronic obstructive pulmonary disease, unspecified: Secondary | ICD-10-CM | POA: Insufficient documentation

## 2020-05-25 DIAGNOSIS — I483 Typical atrial flutter: Secondary | ICD-10-CM | POA: Diagnosis not present

## 2020-05-25 HISTORY — PX: CARDIOVERSION: SHX1299

## 2020-05-25 LAB — GLUCOSE, CAPILLARY: Glucose-Capillary: 134 mg/dL — ABNORMAL HIGH (ref 70–99)

## 2020-05-25 SURGERY — CARDIOVERSION
Anesthesia: General

## 2020-05-25 MED ORDER — PROPOFOL 10 MG/ML IV BOLUS
INTRAVENOUS | Status: DC | PRN
  Administered 2020-05-25: 80 mg via INTRAVENOUS

## 2020-05-25 MED ORDER — LIDOCAINE 2% (20 MG/ML) 5 ML SYRINGE
INTRAMUSCULAR | Status: DC | PRN
  Administered 2020-05-25: 60 mg via INTRAVENOUS

## 2020-05-25 MED ORDER — SODIUM CHLORIDE 0.9 % IV SOLN: INTRAVENOUS | Status: DC | PRN

## 2020-05-25 MED ORDER — SODIUM CHLORIDE 0.9 % IV SOLN
INTRAVENOUS | Status: AC | PRN
  Administered 2020-05-25: 500 mL via INTRAVENOUS

## 2020-05-25 NOTE — H&P (Signed)
ATRIAL FIB OFFICE VISIT  05/17/2020 [name]   Victor Needs, NP  Cardiology  Persistent atrial fibrillation ([name]) +1 more  Dx  Referred by Victor Hatchet, MD  Reason for Visit    Additional Documentation  Vitals:  BP 118/72  Pulse 70  Ht 5\' 11"  (1.803 m)  Wt 88.7 kg  BMI 27.28 kg/m  BSA 2.11 m    More Vitals  Flowsheets:  Anthropometrics,  NEWS,  MEWS Score,  Method of Visit    Encounter Info:  Billing Info,  History,  Allergies,  Detailed Report     All Notes   Addendum Note by Victor Needs, NP at 05/17/2020 11:30 AM  Author: Sherran Needs, NP Author Type: Nurse Practitioner Filed: 05/17/2020 2:00 PM  Note Status: Signed Cosign: Cosign Not Required Date of Service: 05/17/2020 11:30 AM  Editor: Victor Needs, NP (Nurse Practitioner)               Encounter addended by: Victor Needs, NP on: 05/17/2020 2:00 PM  Actions taken: Level of Service modified       Progress Notes by Victor Needs, NP at 05/17/2020 11:30 AM  Author: Sherran Needs, NP Author Type: Nurse Practitioner Filed: 05/17/2020 1:49 PM  Note Status: Signed Cosign: Cosign Not Required Date of Service: 05/17/2020 11:30 AM  Editor: Victor Needs, NP (Nurse Practitioner)             Expand AllCollapse All     Primary Care Physician: Victor Hatchet, MD Referring Physician  [name]  AHF: [name] is a 70 y.o. male with a h/o advanced COPD on home O2, with frequent flareup,A. fib on Tikosyn and systemic anticoagulation, IIDM. Patient presented to the ED on 6/19/21with increasing short of breath,wheezing, cough and poor oral intake for 3 days. Denies any fever chills.   Patient came to ED a day earlier for same acute issue and was discharged with p.o. steroid.His symptoms persisted and he was also not able to take his regular medications including Tikosyn. He returned back to ED on 6/19.   He had  missed a couple of days of his Tikosyn as he felt nauseated and could not take his meds.He was in  afib.  EP saw pt in the hospital and restarted tikosyn He underwent successful cardioversion on 10/18/17. For his COPD he had po doxycycline in the hospital as well as IV steroids.   In the afib clinic, 10/28/19,   he was in [place] and feels improved. His breathing is also improved.  He is a sensed, v paced today. Being compliant with his meds.   F/u in afib clinic, 02/18/20. Per device clinic he has been in persistent  afib since 01/12/20. He was hospitalized 9/24 for COPD exacerbation. He was diuresed with lasix.  He continues on Tikosyn 250 mcg bid. He has not missed any  eliquis 5 mg bid for at the least 3 weeks. No Vaccines. CHA2DS2VASc score of at least 5. Wearing O2 via continuous [place].   F/u in afib clinic, 03/16/20. He saw [name] 03/14/20 and was found to be back in afib.  Interrogation showed he had  SR for around 5 hours after CV, 10/29. C/o fatigue in afib,  he does not feel well out of rhythm. Ekg shows v paced at 70 bpm.   He is now f/u in afib clinic, 1/19 /21,  for afib ablation that Dr.  Allred  performed 04/19/20. Unfortunately, he is v paced today and interrogation shows that he went back into afib 12/24. He is feeling fatigued. He is also getting close to ERI and paceart has been moved up to monthly checks.   Today, he denies symptoms of palpitations, chest pain, shortness of breath, orthopnea, PND, lower extremity edema, dizziness, presyncope, syncope, or neurologic sequela. + for fatigue. The patient is tolerating medications without difficulties and is otherwise without complaint today.       Past Medical History:  Diagnosis Date  . Aortic stenosis, mild   . Arthritis of hand    "just a little bit in both hands" (03/31/2012)  . Asthma    "little bit" (03/31/2012)  . Atrial fibrillation [facility])    dx '04; DCCV '04, placed on flecainide, failed DCCV 04/2010, flecainide  stopped; s/p successful A.fib ablation 01/31/12  . CHF (congestive heart failure) ([place])   . Controlled type 2 diabetes mellitus without complication, without long-term current use of insulin ([place]) 12/13/2019  . COPD (chronic obstructive pulmonary disease) ([place])   . DJD (degenerative joint disease)   . Fibromyalgia   . GERD (gastroesophageal reflux disease)   . Hyperlipidemia   . Hypertension   . Hypothyroidism    S/P radiation  . Left atrial enlargement    LA size 2mm by echo 11/21/11  . Mitral regurgitation    trivial  . Obstructive sleep apnea    mild by sleep study 2013; pt stated he does not have a machine because "it wasn't bad enough for him to have one"  . Pacemaker 03/31/2012  . Restless leg syndrome   . Second degree AV block   . Splenomegaly   . Visit for monitoring Tikosyn therapy 04/16/2016        Past Surgical History:  Procedure Laterality Date  . AORTIC VALVE REPLACEMENT N/A 08/22/2015   Procedure: AORTIC VALVE REPLACEMENT (AVR);  Surgeon: Victor Poot, MD;  Location: [place];  Service: Open Heart Surgery;  Laterality: N/A;  . ATRIAL FIBRILLATION ABLATION  01/30/2012   PVI by [name]  . ATRIAL FIBRILLATION ABLATION N/A 01/31/2012   Procedure: ATRIAL FIBRILLATION ABLATION;  Surgeon: Victor Grayer, MD;  Location[facility] CATH LAB;  Service: Cardiovascular;  Laterality: N/A;  . ATRIAL FIBRILLATION ABLATION N/A 04/19/2020   Procedure: ATRIAL FIBRILLATION ABLATION;  Surgeon: Victor Grayer, MD;  Location: [place] CV LAB;  Service: Cardiovascular;  Laterality: N/A;  . BACK SURGERY     X 3  . CARDIAC CATHETERIZATION N/A 08/09/2015   Procedure: Right/Left Heart Cath and Coronary Angiography;  Surgeon: Victor M Martinique, MD;  Location: [place] CV LAB;  Service: Cardiovascular;  Laterality: N/A;  . CARDIAC CATHETERIZATION N/A 02/05/2016   Procedure: Right/Left Heart Cath and Coronary Angiography;  Surgeon: Victor Booze, MD;  Location: [place] CV LAB;  Service: Cardiovascular;  Laterality: N/A;  . CARDIAC CATHETERIZATION N/A 04/17/2016   Procedure: Right Heart Cath;  Surgeon: Victor Artist, MD;  Location: [place] CV LAB;  Service: Cardiovascular;  Laterality: N/A;  . CARDIOVASCULAR STRESS TEST  03/12/2002   EF 48%, NO EVIDENCE OF ISCHEMIA  . CARDIOVERSION  01/2012; 03/31/2012  . CARDIOVERSION N/A 02/25/2012   Procedure: CARDIOVERSION;  Surgeon: Victor Grayer, MD;  Location: [facility] CATH LAB;  Service: Cardiovascular;  Laterality: N/A;  . CARDIOVERSION N/A 03/31/2012   Procedure: CARDIOVERSION;  Surgeon: Victor Grayer, MD;  Location: [facility] CATH LAB;  Service: Cardiovascular;  Laterality: N/A;  . CARDIOVERSION N/A 11/23/2015  Procedure: CARDIOVERSION;  Surgeon: Larey Dresser, MD;  Location: [place];  Service: Cardiovascular;  Laterality: N/A;  . CARDIOVERSION N/A 04/08/2016   Procedure: CARDIOVERSION;  Surgeon: Victor Artist, MD;  Location: [facility] ENDOSCOPY;  Service: Cardiovascular;  Laterality: N/A;  . CARDIOVERSION N/A 09/14/2018   Procedure: CARDIOVERSION;  Surgeon: Josue Hector, MD;  Location: [facility] ENDOSCOPY;  Service: Cardiovascular;  Laterality: N/A;  . CARDIOVERSION N/A 10/19/2019   Procedure: CARDIOVERSION;  Surgeon: Victor Artist, MD;  Location: [facility] ENDOSCOPY;  Service: Cardiovascular;  Laterality: N/A;  . CARDIOVERSION N/A 02/25/2020   Procedure: CARDIOVERSION;  Surgeon: Fay Records, MD;  Location: [place];  Service: Cardiovascular;  Laterality: N/A;  . CLIPPING OF ATRIAL APPENDAGE N/A 08/22/2015   Procedure: CLIPPING OF ATRIAL APPENDAGE;  Surgeon: Victor Poot, MD;  Location: [place];  Service: Open Heart Surgery;  Laterality: N/A;  . DOPPLER ECHOCARDIOGRAPHY  03/11/2002   EF 70-75%  . FINGER SURGERY Left    Middle finger  . FINGER TENDON REPAIR  1980's   "right little finger" (03/31/2012)  . INSERT / REPLACE / REMOVE PACEMAKER     [employer]  . IR RADIOLOGIST EVAL & MGMT   05/06/2019  . IR RADIOLOGIST EVAL & MGMT  10/06/2019  . LEFT AND RIGHT HEART CATHETERIZATION WITH CORONARY ANGIOGRAM N/A 10/27/2012   Procedure: LEFT AND RIGHT HEART CATHETERIZATION WITH CORONARY ANGIOGRAM;  Surgeon: Laverda Page, MD;  Location: [facility] CATH LAB;  Service: Cardiovascular;  Laterality: N/A;  . LUMBAR DISC SURGERY  1980's X2;  2000's  . MAZE N/A 08/22/2015   Procedure: MAZE;  Surgeon: Victor Poot, MD;  Location: [place];  Service: Open Heart Surgery;  Laterality: N/A;  . PACEMAKER INSERTION  03/31/2012   STJ Accent DR pacemaker implanted by Dr [name]  . PERMANENT PACEMAKER INSERTION N/A 03/31/2012   Procedure: PERMANENT PACEMAKER INSERTION;  Surgeon: Victor Grayer, MD;  Location: [facility] CATH LAB;  Service: Cardiovascular;  Laterality: N/A;  . TEE WITHOUT CARDIOVERSION  01/30/2012   Procedure: TRANSESOPHAGEAL ECHOCARDIOGRAM (TEE);  Surgeon: Larey Dresser, MD;  Location: [place];  Service: Cardiovascular;  Laterality: N/A;  ablation next day  . TEE WITHOUT CARDIOVERSION N/A 08/22/2015   Procedure: TRANSESOPHAGEAL ECHOCARDIOGRAM (TEE);  Surgeon: Victor Poot, MD;  Location: [place];  Service: Open Heart Surgery;  Laterality: N/A;  . TEE WITHOUT CARDIOVERSION N/A 10/19/2019   Procedure: TRANSESOPHAGEAL ECHOCARDIOGRAM (TEE);  Surgeon: Victor Artist, MD;  Location: [facility] ENDOSCOPY;  Service: Cardiovascular;  Laterality: N/A;  . US ECHOCARDIOGRAPHY  08/07/2009   EF 55-60%          Current Outpatient Medications  Medication Sig Dispense Refill  . albuterol (PROVENTIL HFA;VENTOLIN HFA) 108 (90 Base) MCG/ACT inhaler Inhale 2 puffs into the lungs every 6 (six) hours as needed for wheezing or shortness of breath. 1 Inhaler 5  . amLODipine (NORVASC) 10 MG tablet Take 0.5 tablets (5 mg total) by mouth daily. 30 tablet 11  . apixaban (ELIQUIS) 5 MG TABS tablet Take 5 mg by mouth 2 (two) times daily.    . bisoprolol (ZEBETA) 5 MG tablet Take 5 mg by mouth daily.    .  Budeson-Glycopyrrol-Formoterol (BREZTRI AEROSPHERE) 160-9-4.8 MCG/ACT AERO Inhale 2 puffs into the lungs in the morning and at bedtime. 5.9 g 0  . Calcium-Magnesium-Zinc (CAL-MAG-ZINC PO) Take 1 tablet by mouth every evening.    . Cholecalciferol (VITAMIN D) 2000 UNITS CAPS Take 8,000 Units by mouth at bedtime.     [name]  dofetilide (TIKOSYN) 250 MCG capsule Take 1 capsule (250 mcg total) by mouth 2 (two) times daily. 180 capsule 1  . empagliflozin (JARDIANCE) 10 MG TABS tablet Take 1 tablet (10 mg total) by mouth daily before breakfast. 30 tablet 6  . fexofenadine (ALLEGRA) 180 MG tablet Take 1 tablet (180 mg total) by mouth daily as needed for allergies or rhinitis.    [name] gabapentin (NEURONTIN) 300 MG capsule Take 600-1,200 mg by mouth See admin instructions. Take 600 mg by mouth in the morning, 600 mg at lunchtime, and take 1200 mg at bedtime  3  . guaifenesin (HUMIBID E) 400 MG TABS tablet Take 800 mg by mouth every morning.    [name] levothyroxine (SYNTHROID, LEVOTHROID) 175 MCG tablet Take 175 mcg by mouth daily before breakfast.    . Multiple Vitamin (MULTIVITAMIN WITH MINERALS) TABS tablet Take 1 tablet by mouth daily. 30 tablet 0  . ondansetron (ZOFRAN-ODT) 4 MG disintegrating tablet Take 4 mg by mouth every 8 (eight) hours as needed for nausea or vomiting.    [name] oxycodone (OXY-IR) 5 MG capsule Take 5 mg by mouth every 4 (four) hours as needed.    . OXYGEN Inhale 2 L into the lungs continuous.    . pantoprazole (PROTONIX) 40 MG tablet Take 40 mg by mouth daily.    . polyethylene glycol powder (GLYCOLAX/MIRALAX) 17 GM/SCOOP powder Take 17 g by mouth daily as needed for mild constipation.    . Polyvinyl Alcohol-Povidone (REFRESH OP) Place 1 drop into both eyes daily as needed (dry eyes).    . potassium chloride (KLOR-CON) 10 MEQ tablet Take 20 mEq by mouth See admin instructions. Take 20 meq twice every other day (take only on torsemide days)    . predniSONE (DELTASONE) 5 MG  tablet Take 1 tablet (5 mg total) by mouth daily with breakfast. 30 tablet 3  . protein supplement shake (PREMIER PROTEIN) LIQD Take 11 oz by mouth daily.    [name] rOPINIRole (REQUIP) 1 MG tablet Take 1 tablet (1 mg total) by mouth in the morning and at bedtime.    . rosuvastatin (CRESTOR) 10 MG tablet Take 10 mg by mouth at bedtime.     [name] spironolactone (ALDACTONE) 25 MG tablet Take 1 tablet (25 mg total) by mouth daily. 90 tablet 3  . torsemide (DEMADEX) 20 MG tablet Take 40 mg by mouth every other day.    . traMADol-acetaminophen (ULTRACET) 37.5-325 MG tablet Take 2 tablets by mouth 2 (two) times daily.     No current facility-administered medications for this encounter.         Allergies  Allergen Reactions  . Meloxicam Rash  . 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 syndrome 09/02/15, resolved with diphenhydramine and slowing of rate    Social History        Socioeconomic History  . Marital status: Married    Spouse name: Not on file  . Number of children: Not on file  . Years of education: Not on file  . Highest education level: Not on file  Occupational History  . Occupation: retired  Tobacco Use  . Smoking status: Former Smoker    Packs/day: 1.00    Years: 25.00    Pack years: 25.00    Types: Cigarettes    Quit date: 07/18/1991    Years since quitting: 28.8  . Smokeless tobacco: Never Used  Vaping Use  . Vaping Use: Never used  Substance and Sexual Activity  . Alcohol use:  Not Currently    Comment: occasional  . Drug use: Yes    Types: Marijuana    Comment: rarely use  . Sexual activity: Not Currently  Other Topics Concern  . Not on file  Social History Narrative   Lives in [place] with spouse.  Works as a Air traffic controller.   Social Determinants of Health   Financial Resource Strain: Not on file  Food Insecurity: Not on file  Transportation [name]: Not on file  Physical Activity: Not on file  Stress: Not  on file  Social Connections: Not on file  Intimate Partner Violence: Not on file         Family History  Problem Relation Age of Onset  . Heart failure Mother   . Stroke Father     ROS- All systems are reviewed and negative except as per the HPI above  Physical Exam:    Vitals:   05/17/20 1140  BP: 118/72  Pulse: 70  Weight: 88.7 kg  Height: 5\' 11"  (1.803 m)      Wt Readings from Last 3 Encounters:  05/17/20 88.7 kg  04/19/20 89.8 kg  04/12/20 94.8 kg    Labs: Recent Labs       Lab Results  Component Value Date   NA 137 05/17/2020   K 4.1 05/17/2020   CL 99 05/17/2020   CO2 27 05/17/2020   GLUCOSE 146 (H) 05/17/2020   BUN 32 (H) 05/17/2020   CREATININE 2.11 (H) 05/17/2020   CALCIUM 9.5 05/17/2020   PHOS 4.3 12/14/2019   MG 2.4 05/17/2020     Recent Labs       Lab Results  Component Value Date   INR 1.6 (H) 10/18/2019     Recent Labs       Lab Results  Component Value Date   CHOL 114 (L) 08/03/2015   HDL 30 (L) 08/03/2015   LDLCALC 62 08/03/2015   TRIG 111 08/03/2015       GEN- The patient is well appearing, alert and oriented x 3 today.   Head- normocephalic, atraumatic Eyes-  Sclera clear, conjunctiva pink Ears- hearing intact Oropharynx- clear Neck- supple, no JVP Lymph- no cervical lymphadenopathy Lungs- Clear to ausculation bilaterally, normal work of breathing Heart- irregular rate and rhythm, no murmurs, rubs or gallops, PMI not laterally displaced GI- soft, NT, ND, + BS Extremities- no clubbing, cyanosis, or edema MS- no significant deformity or atrophy Skin- no rash or lesion Psych- euthymic mood, full affect Neuro- strength and sensation are intact  EKG-v paced rhythm at 70 bpm, qrs int 108 ms, qtc 410 ms  St jude interrogation shows persistent afib since 12/24.     Assessment and Plan: 1. Persistent  afib Recent breakthrough with COPD exacerbation in September and persists to  present Cardioversion 10/29 with ERAF  Discussed options with pt, he appears that he is failing tikosyn to maintain SR With his significant lung disease,  amiodarone would not  be a good oprion He  has had an ablation in 2013 and a [place] in 2017  He is now s/p second ablation 04/18/20 with return of afib 12/24 Will plan for cardioversion  Continue tikosyn at 250 mcg bid  Continue bisoprolol at 5 mg bid  No covid vaccines  Cbc/bmet/mag/covid   2. CHA2DS2VASc score of at least 4 Continue eliquis 5 mg bid  States no missed doses x 3 weeks   3.COPD Continuous  O2 via nasal cannula    4. HF Weight  stable   F/u here after CV, [name],  3/16  [name]. [name] Afib [facility] [address] [place], [place] [address] [phone]         For [place]; compliant with apixaban; no changes [name]

## 2020-05-25 NOTE — Anesthesia Procedure Notes (Signed)
Date/Time: 05/25/2020 10:35 AM Performed by: Trinna Post., CRNA Pre-anesthesia Checklist: Patient identified, Emergency Drugs available, Suction available, Patient being monitored and Timeout performed Patient Re-evaluated:Patient Re-evaluated prior to induction Oxygen Delivery Method: Ambu bag Preoxygenation: Pre-oxygenation with 100% oxygen Induction Type: IV induction Placement Confirmation: positive ETCO2

## 2020-05-25 NOTE — Transfer of Care (Signed)
Immediate Anesthesia Transfer of Care Note  Patient: [name]  Procedure(s) Performed: CARDIOVERSION (N/A )  Patient Location: PACU and Endoscopy Unit  Anesthesia Type:General  Level of Consciousness: drowsy  Airway & Oxygen Therapy: Patient Spontanous Breathing  Post-op Assessment: Report given to RN and Post -op Vital signs reviewed and stable  Post vital signs: Reviewed and stable  Last Vitals:  Vitals Value Taken Time  BP    Temp    Pulse    Resp    SpO2      Last Pain:  Vitals:   05/25/20 1015  TempSrc: Oral  PainSc: 0-No pain         Complications: No complications documented.

## 2020-05-25 NOTE — Anesthesia Preprocedure Evaluation (Addendum)
Anesthesia Evaluation  Patient identified by MRN, date of birth, ID band Patient awake    Reviewed: Allergy & Precautions, NPO status , Patient's Chart, lab work & pertinent test results, reviewed documented beta blocker date and time   Airway Mallampati: II  TM Distance: >3 FB Neck ROM: Full    Dental  (+) Teeth Intact   Pulmonary asthma , sleep apnea , COPD, former smoker,    Pulmonary exam normal        Cardiovascular hypertension, Pt. on medications and Pt. on home beta blockers + CAD and +CHF  + dysrhythmias Atrial Fibrillation + pacemaker  Rhythm:Irregular Rate:Normal     Neuro/Psych negative neurological ROS  negative psych ROS   GI/Hepatic GERD  Medicated,  Endo/Other  diabetesHypothyroidism   Renal/GU   negative genitourinary   Musculoskeletal  (+) Arthritis , Osteoarthritis,    Abdominal (+)  Abdomen: soft. Bowel sounds: normal.  Peds  Hematology   Anesthesia Other Findings   Reproductive/Obstetrics                          Anesthesia Physical Anesthesia Plan  ASA: III  Anesthesia Plan: General   Post-op Pain Management:    Induction: Intravenous  PONV Risk Score and Plan: 2 and Treatment may vary due to age or medical condition and Propofol infusion  Airway Management Planned: Mask  Additional Equipment: None  Intra-op Plan:   Post-operative Plan:   Informed Consent: I have reviewed the patients History and Physical, chart, labs and discussed the procedure including the risks, benefits and alternatives for the proposed anesthesia with the patient or authorized representative who has indicated his/her understanding and acceptance.     Dental advisory given  Plan Discussed with: CRNA  Anesthesia Plan Comments: (ECHO 08/21: 1. Left ventricular ejection fraction, by estimation, is 60 to 65%. The  left ventricle has normal function. The left ventricle has no  regional  wall motion abnormalities. There is mild left ventricular hypertrophy.  Left ventricular diastolic parameters  are indeterminate.  2. Pacing wires in RA/RV. Right ventricular systolic function is normal.  The right ventricular size is normal. There is severely elevated pulmonary  artery systolic pressure.  3. Left atrial size was moderately dilated.  4. The mitral valve is normal in structure. Mild mitral valve  regurgitation. No evidence of mitral stenosis.  5. Tricuspid valve regurgitation is mild to moderate.  6. Normal appearing bioprosthetic AVR with No PvL. The aortic valve has  been repaired/replaced. Aortic valve regurgitation is not visualized. No  aortic stenosis is present.  7. The inferior vena cava is normal in size with greater than 50%  respiratory variability, suggesting right atrial pressure of 3 mmHg. )        Anesthesia Quick Evaluation

## 2020-05-25 NOTE — Discharge Instructions (Signed)
Electrical Cardioversion Electrical cardioversion is the delivery of a jolt of electricity to restore a normal rhythm to the heart. A rhythm that is too fast or is not regular keeps the heart from pumping well. In this procedure, sticky patches or metal paddles are placed on the chest to deliver electricity to the heart from a device. This procedure may be done in an emergency if:  There is low or no blood pressure as a result of the heart rhythm.  Normal rhythm must be restored as fast as possible to protect the brain and heart from further damage.  It may save a life. This may also be a scheduled procedure for irregular or fast heart rhythms that are not immediately life-threatening. Tell a health care provider about:  Any allergies you have.  All medicines you are taking, including vitamins, herbs, eye drops, creams, and over-the-counter medicines.  Any problems you or family members have had with anesthetic medicines.  Any blood disorders you have.  Any surgeries you have had.  Any medical conditions you have.  Whether you are pregnant or may be pregnant. What are the risks? Generally, this is a safe procedure. However, problems may occur, including:  Allergic reactions to medicines.  A blood clot that breaks free and travels to other parts of your body.  The possible return of an abnormal heart rhythm within hours or days after the procedure.  Your heart stopping (cardiac arrest). This is rare. What happens before the procedure? Medicines  Your health care provider may have you start taking: ? Blood-thinning medicines (anticoagulants) so your blood does not clot as easily. ? Medicines to help stabilize your heart rate and rhythm.  Ask your health care provider about: ? Changing or stopping your regular medicines. This is especially important if you are taking diabetes medicines or blood thinners. ? Taking medicines such as aspirin and ibuprofen. These medicines can  thin your blood. Do not take these medicines unless your health care provider tells you to take them. ? Taking over-the-counter medicines, vitamins, herbs, and supplements. General instructions  Follow instructions from your health care provider about eating or drinking restrictions.  Plan to have someone take you home from the hospital or clinic.  If you will be going home right after the procedure, plan to have someone with you for 24 hours.  Ask your health care provider what steps will be taken to help prevent infection. These may include washing your skin with a germ-killing soap. What happens during the procedure?  An IV will be inserted into one of your veins.  Sticky patches (electrodes) or metal paddles may be placed on your chest.  You will be given a medicine to help you relax (sedative).  An electrical shock will be delivered. The procedure may vary among health care providers and hospitals.   What can I expect after the procedure?  Your blood pressure, heart rate, breathing rate, and blood oxygen level will be monitored until you leave the hospital or clinic.  Your heart rhythm will be watched to make sure it does not change.  You may have some redness on the skin where the shocks were given. Follow these instructions at home:  Do not drive for 24 hours if you were given a sedative during your procedure.  Take over-the-counter and prescription medicines only as told by your health care provider.  Ask your health care provider how to check your pulse. Check it often.  Rest for 48 hours after the procedure   or as told by your health care provider.  Avoid or limit your caffeine use as told by your health care provider.  Keep all follow-up visits as told by your health care provider. This is important. Contact a health care provider if:  You feel like your heart is beating too quickly or your pulse is not regular.  You have a serious muscle cramp that does not go  away. Get help right away if:  You have discomfort in your chest.  You are dizzy or you feel faint.  You have trouble breathing or you are short of breath.  Your speech is slurred.  You have trouble moving an arm or leg on one side of your body.  Your fingers or toes turn cold or blue. Summary  Electrical cardioversion is the delivery of a jolt of electricity to restore a normal rhythm to the heart.  This procedure may be done right away in an emergency or may be a scheduled procedure if the condition is not an emergency.  Generally, this is a safe procedure.  After the procedure, check your pulse often as told by your health care provider. This information is not intended to replace advice given to you by your health care provider. Make sure you discuss any questions you have with your health care provider. Document Revised: 11/16/2018 Document Reviewed: 11/16/2018 [employer] Patient Education  2021 [employer]  

## 2020-05-25 NOTE — Procedures (Signed)
Electrical Cardioversion Procedure Note [name] [id number] 09/04/1950  Procedure: Electrical Cardioversion Indications:  Atrial Fibrillation  Procedure Details Consent: Risks of procedure as well as the alternatives and risks of each were explained to the (patient/caregiver).  Consent for procedure obtained. Time Out: Verified patient identification, verified procedure, site/side was marked, verified correct patient position, special equipment/implants available, medications/allergies/relevent history reviewed, required imaging and test results available.  Performed  Patient placed on cardiac monitor, pulse oximetry, supplemental oxygen as necessary.  Sedation given: Pt sedated by anesthesia with lidocaine 60 mg and diprovan 80 mg IV. Pacer pads placed anterior and posterior chest.  Cardioverted 1 time(s).  Cardioverted at [place].  Evaluation Findings: Post procedure EKG shows: AV paced rhythm Complications: None Patient did tolerate procedure well.   Kirk Ruths 05/25/2020, 10:12 AM

## 2020-05-25 NOTE — Interval H&P Note (Signed)
History and Physical Interval Note:  05/25/2020 10:10 AM  [name]  has presented today for surgery, with the diagnosis of AFIB.  The various methods of treatment have been discussed with the patient and family. After consideration of risks, benefits and other options for treatment, the patient has consented to  Procedure(s): CARDIOVERSION (N/A) as a surgical intervention.  The patient's history has been reviewed, patient examined, no change in status, stable for surgery.  I have reviewed the patient's chart and labs.  Questions were answered to the patient's satisfaction.     Kirk Ruths

## 2020-05-26 ENCOUNTER — Encounter (HOSPITAL_COMMUNITY): Payer: Self-pay | Admitting: Cardiology

## 2020-05-27 ENCOUNTER — Other Ambulatory Visit: Payer: Self-pay | Admitting: Internal Medicine

## 2020-05-27 NOTE — Progress Notes (Signed)
Remote pacemaker transmission.   

## 2020-05-29 ENCOUNTER — Emergency Department (HOSPITAL_BASED_OUTPATIENT_CLINIC_OR_DEPARTMENT_OTHER)
Admission: EM | Admit: 2020-05-29 | Discharge: 2020-05-29 | Disposition: A | Discharge status: Home / Self Care | Payer: Medicare Other | Attending: Emergency Medicine | Admitting: Emergency Medicine

## 2020-05-29 ENCOUNTER — Telehealth (HOSPITAL_COMMUNITY): Payer: Self-pay | Admitting: *Deleted

## 2020-05-29 ENCOUNTER — Other Ambulatory Visit: Payer: Self-pay

## 2020-05-29 ENCOUNTER — Encounter (HOSPITAL_BASED_OUTPATIENT_CLINIC_OR_DEPARTMENT_OTHER): Payer: Self-pay

## 2020-05-29 ENCOUNTER — Emergency Department (HOSPITAL_BASED_OUTPATIENT_CLINIC_OR_DEPARTMENT_OTHER): Payer: Medicare Other

## 2020-05-29 DIAGNOSIS — Z7984 Long term (current) use of oral hypoglycemic drugs: Secondary | ICD-10-CM | POA: Insufficient documentation

## 2020-05-29 DIAGNOSIS — Z79899 Other long term (current) drug therapy: Secondary | ICD-10-CM | POA: Diagnosis not present

## 2020-05-29 DIAGNOSIS — Z7901 Long term (current) use of anticoagulants: Secondary | ICD-10-CM | POA: Diagnosis not present

## 2020-05-29 DIAGNOSIS — J441 Chronic obstructive pulmonary disease with (acute) exacerbation: Secondary | ICD-10-CM | POA: Insufficient documentation

## 2020-05-29 DIAGNOSIS — M79604 Pain in right leg: Secondary | ICD-10-CM | POA: Diagnosis not present

## 2020-05-29 DIAGNOSIS — R2241 Localized swelling, mass and lump, right lower limb: Secondary | ICD-10-CM | POA: Diagnosis not present

## 2020-05-29 DIAGNOSIS — Z7951 Long term (current) use of inhaled steroids: Secondary | ICD-10-CM | POA: Diagnosis not present

## 2020-05-29 DIAGNOSIS — M7989 Other specified soft tissue disorders: Secondary | ICD-10-CM | POA: Diagnosis not present

## 2020-05-29 DIAGNOSIS — I4891 Unspecified atrial fibrillation: Secondary | ICD-10-CM | POA: Diagnosis not present

## 2020-05-29 DIAGNOSIS — I5033 Acute on chronic diastolic (congestive) heart failure: Secondary | ICD-10-CM | POA: Insufficient documentation

## 2020-05-29 DIAGNOSIS — M79661 Pain in right lower leg: Secondary | ICD-10-CM | POA: Diagnosis not present

## 2020-05-29 DIAGNOSIS — J45909 Unspecified asthma, uncomplicated: Secondary | ICD-10-CM | POA: Insufficient documentation

## 2020-05-29 DIAGNOSIS — N1832 Chronic kidney disease, stage 3b: Secondary | ICD-10-CM | POA: Insufficient documentation

## 2020-05-29 DIAGNOSIS — Z950 Presence of cardiac pacemaker: Secondary | ICD-10-CM | POA: Insufficient documentation

## 2020-05-29 DIAGNOSIS — I130 Hypertensive heart and chronic kidney disease with heart failure and stage 1 through stage 4 chronic kidney disease, or unspecified chronic kidney disease: Secondary | ICD-10-CM | POA: Insufficient documentation

## 2020-05-29 DIAGNOSIS — R600 Localized edema: Secondary | ICD-10-CM | POA: Diagnosis not present

## 2020-05-29 DIAGNOSIS — I2510 Atherosclerotic heart disease of native coronary artery without angina pectoris: Secondary | ICD-10-CM | POA: Insufficient documentation

## 2020-05-29 DIAGNOSIS — Z87891 Personal history of nicotine dependence: Secondary | ICD-10-CM | POA: Diagnosis not present

## 2020-05-29 DIAGNOSIS — L03115 Cellulitis of right lower limb: Secondary | ICD-10-CM | POA: Diagnosis not present

## 2020-05-29 DIAGNOSIS — E1122 Type 2 diabetes mellitus with diabetic chronic kidney disease: Secondary | ICD-10-CM | POA: Diagnosis not present

## 2020-05-29 DIAGNOSIS — E039 Hypothyroidism, unspecified: Secondary | ICD-10-CM | POA: Diagnosis not present

## 2020-05-29 DIAGNOSIS — R52 Pain, unspecified: Secondary | ICD-10-CM

## 2020-05-29 DIAGNOSIS — S8011XA Contusion of right lower leg, initial encounter: Secondary | ICD-10-CM | POA: Diagnosis not present

## 2020-05-29 LAB — CBC WITH DIFFERENTIAL/PLATELET
Abs Immature Granulocytes: 0.05 10*3/uL (ref 0.00–0.07)
Basophils Absolute: 0 10*3/uL (ref 0.0–0.1)
Basophils Relative: 0 %
Eosinophils Absolute: 0.1 10*3/uL (ref 0.0–0.5)
Eosinophils Relative: 1 %
HCT: 49.2 % (ref 39.0–52.0)
Hemoglobin: 15.9 g/dL (ref 13.0–17.0)
Immature Granulocytes: 1 %
Lymphocytes Relative: 9 %
Lymphs Abs: 0.9 10*3/uL (ref 0.7–4.0)
MCH: 29.9 pg (ref 26.0–34.0)
MCHC: 32.3 g/dL (ref 30.0–36.0)
MCV: 92.5 fL (ref 80.0–100.0)
Monocytes Absolute: 0.7 10*3/uL (ref 0.1–1.0)
Monocytes Relative: 8 %
Neutro Abs: 8.1 10*3/uL — ABNORMAL HIGH (ref 1.7–7.7)
Neutrophils Relative %: 81 %
Platelets: 183 10*3/uL (ref 150–400)
RBC: 5.32 MIL/uL (ref 4.22–5.81)
RDW: 14.1 % (ref 11.5–15.5)
WBC: 9.8 10*3/uL (ref 4.0–10.5)
nRBC: 0 % (ref 0.0–0.2)

## 2020-05-29 LAB — BASIC METABOLIC PANEL
Anion gap: 13 (ref 5–15)
BUN: 29 mg/dL — ABNORMAL HIGH (ref 8–23)
CO2: 27 mmol/L (ref 22–32)
Calcium: 9.9 mg/dL (ref 8.9–10.3)
Chloride: 95 mmol/L — ABNORMAL LOW (ref 98–111)
Creatinine, Ser: 1.96 mg/dL — ABNORMAL HIGH (ref 0.61–1.24)
GFR, Estimated: 36 mL/min — ABNORMAL LOW (ref >60)
Glucose, Bld: 191 mg/dL — ABNORMAL HIGH (ref 70–99)
Potassium: 3.9 mmol/L (ref 3.5–5.1)
Sodium: 135 mmol/L (ref 135–145)

## 2020-05-29 LAB — CBC
HCT: 50.7 % (ref 39.0–52.0)
Hemoglobin: 16.4 g/dL (ref 13.0–17.0)
MCH: 29.9 pg (ref 26.0–34.0)
MCHC: 32.3 g/dL (ref 30.0–36.0)
MCV: 92.5 fL (ref 80.0–100.0)
Platelets: 203 10*3/uL (ref 150–400)
RBC: 5.48 MIL/uL (ref 4.22–5.81)
RDW: 14.2 % (ref 11.5–15.5)
WBC: 12.3 10*3/uL — ABNORMAL HIGH (ref 4.0–10.5)
nRBC: 0 % (ref 0.0–0.2)

## 2020-05-29 LAB — CK: Total CK: 41 U/L — ABNORMAL LOW (ref 49–397)

## 2020-05-29 LAB — MAGNESIUM: Magnesium: 2.4 mg/dL (ref 1.7–2.4)

## 2020-05-29 MED ORDER — HYDROCODONE-ACETAMINOPHEN 5-325 MG PO TABS: 1.0000 | ORAL_TABLET | Freq: Four times a day (QID) | ORAL | 0 refills | Status: AC | PRN

## 2020-05-29 MED ORDER — DOXYCYCLINE HYCLATE 100 MG PO CAPS: 100.0000 mg | ORAL_CAPSULE | Freq: Two times a day (BID) | ORAL | 0 refills | Status: DC

## 2020-05-29 MED ORDER — HYDROCODONE-ACETAMINOPHEN 5-325 MG PO TABS
1.0000 | ORAL_TABLET | Freq: Once | ORAL | Status: AC
  Administered 2020-05-29: 1 via ORAL
  Filled 2020-05-29: qty 1

## 2020-05-29 NOTE — ED Provider Notes (Signed)
[place] EMERGENCY DEPARTMENT Provider Note   CSN: [id number] Arrival date & time: 05/29/20  1221     History Chief Complaint  Patient presents with  . Leg Swelling  . Irregular Heart Beat    [name] is a 70 y.o. male.  HPI   70 year old male with past medical history of atrial fibrillation anticoagulated on Eliquis, with pacemaker in place presents the emergency department the right lower extremity injury.  Patient reports to me that 1 week ago he slipped on ice and fell down onto the right leg.  He did not eventually get evaluated.  He presents today with worsening pain and redness of the right shin. Of note patient had a cardioversion four days ago in the office, reported paroxysmal afib. He has been compliant with his AC. He has felt weak and fatigued. Denies fever. The right shin is red and warm. No numbness or tingling or the right foot, no other discoloration.  Past Medical History:  Diagnosis Date  . Aortic stenosis, mild   . Arthritis of hand    "just a little bit in both hands" (03/31/2012)  . Asthma    "little bit" (03/31/2012)  . Atrial fibrillation [facility])    dx '04; DCCV '04, placed on flecainide, failed DCCV 04/2010, flecainide stopped; s/p successful A.fib ablation 01/31/12  . CHF (congestive heart failure) ([place])   . Controlled type 2 diabetes mellitus without complication, without long-term current use of insulin ([place]) 12/13/2019  . COPD (chronic obstructive pulmonary disease) ([place])   . DJD (degenerative joint disease)   . Fibromyalgia   . GERD (gastroesophageal reflux disease)   . Hyperlipidemia   . Hypertension   . Hypothyroidism    S/P radiation  . Left atrial enlargement    LA size 14mm by echo 11/21/11  . Mitral regurgitation    trivial  . Obstructive sleep apnea    mild by sleep study 2013; pt stated he does not have a machine because "it wasn't bad enough for him to have one"  . Pacemaker 03/31/2012  . Restless leg syndrome   .  Second degree AV block   . Splenomegaly   . Visit for monitoring Tikosyn therapy 04/16/2016    Patient Active Problem List   Diagnosis Date Noted  . Pre-operative respiratory examination 03/27/2020  . Acute on chronic diastolic (congestive) heart failure ([place]) 01/22/2020  . Abdominal pain 01/22/2020  . Hypothyroidism 12/13/2019  . Controlled type 2 diabetes mellitus with stage 3 chronic kidney disease, without long-term current use of insulin ([place]) 12/13/2019  . Chronic kidney disease, stage 3b ([place]) 12/13/2019  . Acute bacterial bronchitis 12/13/2019  . COPD exacerbation ([place]) 12/13/2019  . COPD with acute exacerbation ([place]) 12/12/2019  . COPD (chronic obstructive pulmonary disease) ([place]) 10/16/2019  . Typical atrial flutter ([place])   . Chronic respiratory failure with hypoxia ([place]) 05/23/2016  . CAD (coronary artery disease) 05/23/2016  . Persistent atrial fibrillation ([place])   . Atypical atrial flutter ([place])   . Visit for monitoring Tikosyn therapy 04/16/2016  . Chronic diastolic CHF (congestive heart failure) ([place]) 02/03/2016  . Cough 12/06/2015  . Hallucinations 09/04/2015  . Lymphadenopathy 09/04/2015  . Ascending aortic aneurysm ([place]) 09/04/2015  . Hypoxia 09/02/2015  . S/P aortic valve replacement with bioprosthetic valve 09/02/2015  . Pleural effusion 09/02/2015  . S/P AVR 08/22/2015  . GERD without esophagitis 09/20/2014  . 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 Dyspnea 09/15/2013  . Pulmonary hypertension ([place]) 09/15/2013  . Shortness of  breath 05/05/2013  . 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 Bradycardia 03/31/2012  . Second degree AV block 03/31/2012  . AV block, 1st degree 02/01/2012  . PAF (paroxysmal atrial fibrillation) ([place]) 11/13/2011  . Fatigue 11/13/2011  . Hypertension 11/13/2011  . Aortic stenosis 11/13/2011  . Sleep apnea 11/13/2011    Past Surgical History:  Procedure Laterality Date  . AORTIC VALVE REPLACEMENT N/A 08/22/2015   Procedure: AORTIC VALVE  REPLACEMENT (AVR);  Surgeon: Ivin Poot, MD;  Location: [place];  Service: Open Heart Surgery;  Laterality: N/A;  . ATRIAL FIBRILLATION ABLATION  01/30/2012   PVI by Dr. Rayann Heman  . ATRIAL FIBRILLATION ABLATION N/A 01/31/2012   Procedure: ATRIAL FIBRILLATION ABLATION;  Surgeon: Thompson Grayer, MD;  Location: [facility] CATH LAB;  Service: Cardiovascular;  Laterality: N/A;  . ATRIAL FIBRILLATION ABLATION N/A 04/19/2020   Procedure: ATRIAL FIBRILLATION ABLATION;  Surgeon: Thompson Grayer, MD;  Location: [place] CV LAB;  Service: Cardiovascular;  Laterality: N/A;  . BACK SURGERY     X 3  . CARDIAC CATHETERIZATION N/A 08/09/2015   Procedure: Right/Left Heart Cath and Coronary Angiography;  Surgeon: Peter M Martinique, MD;  Location: [place] CV LAB;  Service: Cardiovascular;  Laterality: N/A;  . CARDIAC CATHETERIZATION N/A 02/05/2016   Procedure: Right/Left Heart Cath and Coronary Angiography;  Surgeon: Jettie Booze, MD;  Location: [place] CV LAB;  Service: Cardiovascular;  Laterality: N/A;  . CARDIAC CATHETERIZATION N/A 04/17/2016   Procedure: Right Heart Cath;  Surgeon: Jolaine Artist, MD;  Location: [place] CV LAB;  Service: Cardiovascular;  Laterality: N/A;  . CARDIOVASCULAR STRESS TEST  03/12/2002   EF 48%, NO EVIDENCE OF ISCHEMIA  . CARDIOVERSION  01/2012; 03/31/2012  . CARDIOVERSION N/A 02/25/2012   Procedure: CARDIOVERSION;  Surgeon: Thompson Grayer, MD;  Location: [facility] CATH LAB;  Service: Cardiovascular;  Laterality: N/A;  . CARDIOVERSION N/A 03/31/2012   Procedure: CARDIOVERSION;  Surgeon: Thompson Grayer, MD;  Location: [facility] CATH LAB;  Service: Cardiovascular;  Laterality: N/A;  . CARDIOVERSION N/A 11/23/2015   Procedure: CARDIOVERSION;  Surgeon: Larey Dresser, MD;  Location: [place];  Service: Cardiovascular;  Laterality: N/A;  . CARDIOVERSION N/A 04/08/2016   Procedure: CARDIOVERSION;  Surgeon: Jolaine Artist, MD;  Location: [place];  Service: Cardiovascular;  Laterality:  N/A;  . CARDIOVERSION N/A 09/14/2018   Procedure: CARDIOVERSION;  Surgeon: Josue Hector, MD;  Location: [facility] ENDOSCOPY;  Service: Cardiovascular;  Laterality: N/A;  . CARDIOVERSION N/A 10/19/2019   Procedure: CARDIOVERSION;  Surgeon: Jolaine Artist, MD;  Location: [facility] ENDOSCOPY;  Service: Cardiovascular;  Laterality: N/A;  . CARDIOVERSION N/A 02/25/2020   Procedure: CARDIOVERSION;  Surgeon: Fay Records, MD;  Location: [place];  Service: Cardiovascular;  Laterality: N/A;  . CARDIOVERSION N/A 05/25/2020   Procedure: CARDIOVERSION;  Surgeon: Lelon Perla, MD;  Location: [facility] ENDOSCOPY;  Service: Cardiovascular;  Laterality: N/A;  . CLIPPING OF ATRIAL APPENDAGE N/A 08/22/2015   Procedure: CLIPPING OF ATRIAL APPENDAGE;  Surgeon: Ivin Poot, MD;  Location: [place];  Service: Open Heart Surgery;  Laterality: N/A;  . DOPPLER ECHOCARDIOGRAPHY  03/11/2002   EF 70-75%  . FINGER SURGERY Left    Middle finger  . FINGER TENDON REPAIR  1980's   "right little finger" (03/31/2012)  . INSERT / REPLACE / REMOVE PACEMAKER     St Jude  . IR RADIOLOGIST EVAL & MGMT  05/06/2019  . IR RADIOLOGIST EVAL & MGMT  10/06/2019  . LEFT AND RIGHT HEART CATHETERIZATION WITH CORONARY  ANGIOGRAM N/A 10/27/2012   Procedure: LEFT AND RIGHT HEART CATHETERIZATION WITH CORONARY ANGIOGRAM;  Surgeon: Laverda Page, MD;  Location[facility] C[facility] LAB;  Service: Cardiovascular;  Laterality: N/A;  . LUMBAR DISC SURGERY  1980's X2;  2000's  . MAZE N/A 08/22/2015   Procedure: MAZE;  Surgeon: Ivin Poot, MD;  Location: [place];  Service: Open Heart Surgery;  Laterality: N/A;  . PACEMAKER INSERTION  03/31/2012   STJ Accent DR pacemaker implanted by Dr Rayann Heman  . PERMANENT PACEMAKER INSERTION N/A 03/31/2012   Procedure: PERMANENT PACEMAKER INSERTION;  Surgeon: Thompson Grayer, MD;  Location: [facility] CATH LAB;  Service: Cardiovascular;  Laterality: N/A;  . TEE WITHOUT CARDIOVERSION  01/30/2012   Procedure: TRANSESOPHAGEAL ECHOCARDIOGRAM (TEE);   Surgeon: Larey Dresser, MD;  Location: [place];  Service: Cardiovascular;  Laterality: N/A;  ablation next day  . TEE WITHOUT CARDIOVERSION N/A 08/22/2015   Procedure: TRANSESOPHAGEAL ECHOCARDIOGRAM (TEE);  Surgeon: Ivin Poot, MD;  Location: [place];  Service: Open Heart Surgery;  Laterality: N/A;  . TEE WITHOUT CARDIOVERSION N/A 10/19/2019   Procedure: TRANSESOPHAGEAL ECHOCARDIOGRAM (TEE);  Surgeon: Jolaine Artist, MD;  Location: [facility] ENDOSCOPY;  Service: Cardiovascular;  Laterality: N/A;  . US ECHOCARDIOGRAPHY  08/07/2009   EF 55-60%       Family History  Problem Relation Age of Onset  . Heart failure Mother   . Stroke Father     Social History   Tobacco Use  . Smoking status: Former Smoker    Packs/day: 1.00    Years: 25.00    Pack years: 25.00    Types: Cigarettes    Quit date: 07/18/1991    Years since quitting: 28.8  . Smokeless tobacco: Never Used  Vaping Use  . Vaping Use: Never used  Substance Use Topics  . Alcohol use: Not Currently    Comment: occasional  . Drug use: Yes    Types: Marijuana    Comment: rarely use    Home Medications Prior to Admission medications   Medication Sig Start Date End Date Taking? Authorizing Provider  albuterol (PROVENTIL HFA;VENTOLIN HFA) 108 (90 Base) MCG/ACT inhaler Inhale 2 puffs into the lungs every 6 (six) hours as needed for wheezing or shortness of breath. 12/28/15   Collene Gobble, MD  amLODipine (NORVASC) 10 MG tablet Take 0.5 tablets (5 mg total) by mouth daily. 09/18/15   Nahser, Wonda Cheng, MD  apixaban (ELIQUIS) 5 MG TABS tablet Take 5 mg by mouth 2 (two) times daily.    [provider]  bisoprolol (ZEBETA) 5 MG tablet Take 5 mg by mouth daily.    [provider]  Budeson-Glycopyrrol-Formoterol (BREZTRI AEROSPHERE) 160-9-4.8 MCG/ACT AERO Inhale 2 puffs into the lungs in the morning and at bedtime. 03/24/20   Martyn Ehrich, NP  Cholecalciferol (VITAMIN D) 2000 UNITS CAPS Take 8,000 Units by  mouth at bedtime.     [provider]  dofetilide (TIKOSYN) 250 MCG capsule Take 1 capsule (250 mcg total) by mouth 2 (two) times daily. 05/10/20   Sherran Needs, NP  empagliflozin (JARDIANCE) 10 MG TABS tablet Take 1 tablet (10 mg total) by mouth daily before breakfast. 03/14/20   Bensimhon, Shaune Pascal, MD  fexofenadine (ALLEGRA) 180 MG tablet Take 1 tablet (180 mg total) by mouth daily as needed for allergies or rhinitis. 01/23/20   Aline August, MD  gabapentin (NEURONTIN) 300 MG capsule Take 600-1,200 mg by mouth See admin instructions. Take 600 mg by mouth in the  morning, 600 mg at lunchtime, and take 1200 mg at bedtime 09/13/14   [provider]  guaifenesin (HUMIBID E) 400 MG TABS tablet Take 800 mg by mouth 2 (two) times daily.    [provider]  levothyroxine (SYNTHROID, LEVOTHROID) 175 MCG tablet Take 175 mcg by mouth daily before breakfast.    [provider]  Multiple Minerals-Vitamins (CAL-MAG-ZINC-D) TABS Take 1 tablet by mouth daily.    [provider]  Multiple Vitamin (MULTIVITAMIN WITH MINERALS) TABS tablet Take 1 tablet by mouth daily. 12/15/19   Sheikh, Kateri Mc Latif, DO  ondansetron (ZOFRAN-ODT) 4 MG disintegrating tablet Take 4 mg by mouth every 8 (eight) hours as needed for nausea or vomiting.    [provider]  oxyCODONE-acetaminophen (PERCOCET/ROXICET) 5-325 MG tablet Take 1 tablet by mouth every 12 (twelve) hours.    [provider]  OXYGEN Inhale 2 L into the lungs continuous.    [provider]  pantoprazole (PROTONIX) 40 MG tablet Take 40 mg by mouth daily.    [provider]  polyethylene glycol powder (GLYCOLAX/MIRALAX) 17 GM/SCOOP powder Take 17 g by mouth daily as needed for mild constipation. 01/23/20   Glade Lloyd, MD  Polyvinyl Alcohol-Povidone (REFRESH OP) Place 1 drop into both eyes daily as needed (dry eyes).    [provider]  potassium chloride (KLOR-CON) 10 MEQ tablet  Take 20 mEq by mouth See admin instructions. Take 20 meq twice every other day (take only on torsemide days)    [provider]  predniSONE (DELTASONE) 5 MG tablet Take 1 tablet (5 mg total) by mouth daily with breakfast. 12/21/19   Marguerita Merles Latif, DO  protein supplement shake (PREMIER PROTEIN) LIQD Take 11 oz by mouth daily.    [provider]  rOPINIRole (REQUIP) 1 MG tablet Take 1 tablet (1 mg total) by mouth in the morning and at bedtime. 01/23/20   Glade Lloyd, MD  rosuvastatin (CRESTOR) 10 MG tablet Take 10 mg by mouth at bedtime.  08/08/16   [provider]  spironolactone (ALDACTONE) 25 MG tablet Take 1 tablet (25 mg total) by mouth daily. 02/16/16   Graciella Freer, PA-C  torsemide (DEMADEX) 20 MG tablet Take 40 mg by mouth every other day.    [provider]  traMADol-acetaminophen (ULTRACET) 37.5-325 MG tablet Take 2 tablets by mouth 2 (two) times daily.    [provider]    Allergies    Meloxicam and Vancomycin  Review of Systems   Review of Systems  Constitutional: Positive for fatigue. Negative for chills and fever.  HENT: Negative for congestion.   Eyes: Negative for visual disturbance.  Respiratory: Negative for shortness of breath.   Cardiovascular: Negative for chest pain.  Gastrointestinal: Negative for abdominal pain, diarrhea and vomiting.  Genitourinary: Negative for dysuria.  Musculoskeletal:       Right shin injury, swelling, redness  Skin: Negative for rash.  Neurological: Negative for headaches.    Physical Exam Updated Vital Signs BP 133/75   Pulse 66   Temp 98.9 F (37.2 C) (Oral)   Resp 20   SpO2 100%   Physical Exam Vitals and nursing note reviewed.  Constitutional:      Appearance: Normal appearance.  HENT:     Head: Normocephalic.     Mouth/Throat:     Mouth: Mucous membranes are moist.  Cardiovascular:     Rate and Rhythm: Normal rate.  Pulmonary:     Effort: Pulmonary effort is  normal.  No respiratory distress.  Abdominal:     Palpations: Abdomen is soft.     Tenderness: There is no abdominal tenderness.  Musculoskeletal:     Comments: Right shin is red, warm, focal area of about 2x2 inches on lateral shin of swelling, soft, possible hematoma, strong DP pulses, no foot discoloration  Skin:    General: Skin is warm.  Neurological:     Mental Status: He is alert and oriented to person, place, and time. Mental status is at baseline.  Psychiatric:        Mood and Affect: Mood normal.     ED Results / Procedures / Treatments   Labs (all labs ordered are listed, but only abnormal results are displayed) Labs Reviewed  BASIC METABOLIC PANEL - Abnormal; Notable for the following components:      Result Value   Chloride 95 (*)    Glucose, Bld 191 (*)    BUN 29 (*)    Creatinine, Ser 1.96 (*)    GFR, Estimated 36 (*)    All other components within normal limits  CBC - Abnormal; Notable for the following components:   WBC 12.3 (*)    All other components within normal limits  MAGNESIUM  CK  CBC WITH DIFFERENTIAL/PLATELET    EKG None  Radiology US Venous Img Lower Unilateral Right  Result Date: 05/29/2020 CLINICAL DATA:  Right lower extremity pain and edema. EXAM: RIGHT LOWER EXTREMITY VENOUS DOPPLER ULTRASOUND TECHNIQUE: Gray-scale sonography with graded compression, as well as color Doppler and duplex ultrasound were performed to evaluate the lower extremity deep venous systems from the level of the common femoral vein and including the common femoral, femoral, profunda femoral, popliteal and calf veins including the posterior tibial, peroneal and gastrocnemius veins when visible. The superficial great saphenous vein was also interrogated. Spectral Doppler was utilized to evaluate flow at rest and with distal augmentation maneuvers in the common femoral, femoral and popliteal veins. COMPARISON:  None. FINDINGS: Contralateral Common Femoral Vein: Respiratory  phasicity is normal and symmetric with the symptomatic side. No evidence of thrombus. Normal compressibility. Common Femoral Vein: No evidence of thrombus. Normal compressibility, respiratory phasicity and response to augmentation. Saphenofemoral Junction: No evidence of thrombus. Normal compressibility and flow on color Doppler imaging. Profunda Femoral Vein: No evidence of thrombus. Normal compressibility and flow on color Doppler imaging. Femoral Vein: No evidence of thrombus. Normal compressibility, respiratory phasicity and response to augmentation. Popliteal Vein: No evidence of thrombus. Normal compressibility, respiratory phasicity and response to augmentation. Calf Veins: No evidence of thrombus. Normal compressibility and flow on color Doppler imaging. Superficial Great Saphenous Vein: No evidence of thrombus. Normal compressibility. Venous Reflux:  None. Other Findings: No evidence of superficial thrombophlebitis or abnormal fluid collection. IMPRESSION: No evidence of right lower extremity deep venous thrombosis. Electronically Signed   By: Aletta Edouard M.D.   On: 05/29/2020 16:48    Procedures Procedures   Medications Ordered in ED Medications - No data to display  ED Course  I have reviewed the triage vital signs and the nursing notes.  Pertinent labs & imaging results that were available during my care of the patient were reviewed by me and considered in my medical decision making (see chart for details).    MDM Rules/Calculators/A&P                          Vital signs are stable on arrival, no fever.  He has a mild white  count at 12, baseline AKA, CK is normal.  Ultrasound shows no DVT, x-ray shows no fracture.  The right shin is erythematous, warm, looks like a cellulitis. Strong peripheral pulses. There is a focal area of swelling on the lateral shin but this looks consistent with a hematoma, it is very soft and not indurated or loculated.  There was never a break in the skin  from the original injury, have low suspicion for abscess.  I believe that this is a hematoma, could have started to cellulitis, given that he is on anticoagulation I do not feel attempting any drainage in the emergency department would be appropriate.  Plan for pain control and antibiotics.  Discussed with the family that I believe the area of swelling is a hematoma, could potentially be an abscess that requires drainage but right now he is suitable for outpatient follow-up.  Patient will be discharged and treated as an outpatient.  Discharge plan and strict return to ED precautions discussed, patient verbalizes understanding and agreement.  Final Clinical Impression(s) / ED Diagnoses Final diagnoses:  None    Rx / DC Orders ED Discharge Orders    None       Lorelle Gibbs, DO 05/29/20 2015

## 2020-05-29 NOTE — Discharge Instructions (Addendum)
You have been seen and discharged from the emergency department.  You have cellulitis (infection) of the right lower leg and what appears to be a hematoma from the fall. Your Xrays and US show no fracture or blood clot. Follow-up with your primary provider for reevaluation. Take pain medicine as directed, do not combine with alcohol, do not do heavy activity or drive until you know how it affects you. It may cause drowsiness. This pain medicine does have Tylenol in it. Take antibiotic new and home medications as prescribed. If you have any worsening symptoms, the redness spreads, fevers, or further concerns for health please return to an emergency department for further evaluation.

## 2020-05-29 NOTE — Telephone Encounter (Signed)
Patient wife reports pt has been having nausea and vomiting since cardioversion. Unable to tolerate PO intake. Denies weight gain, abdominal bloating or increased shortness of breath - although the wife states she feels he more short of breath than his normal. Slight cough. No fever. Discussed with Roderic Palau NP - since pt on tikosyn and poor PO intake would recommend presenting to ER for probable dehydration and electrolyte replacement. Pt also had recent injury to his leg when he had near fall on ice which is swollen and is having severe pain from this. Wife states she will take him to the ER for further evaluation.  Will also move up appointment with Dr. Rayann Heman for ERAF post cardioversion. Wife in agreement.

## 2020-05-29 NOTE — Telephone Encounter (Signed)
Remote transmission received that shows presenting EGM of AP/VP rhythm. Patient has had intermittent episodes of AF/AFL with v-rates from 70- 121. Device WNL. DCCV was 05/25/20.

## 2020-05-29 NOTE — Telephone Encounter (Signed)
Patient's wife contacted clinic stating since cardioversion pt very nauseous and weak feeling - sending transmission to see what rhythm he is in .
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
## 2020-05-29 NOTE — Anesthesia Postprocedure Evaluation (Signed)
Anesthesia Post Note  Patient: [name]  Procedure(s) Performed: CARDIOVERSION (N/A )     Patient location during evaluation: Endoscopy Anesthesia Type: General Level of consciousness: awake and alert Pain management: pain level controlled Vital Signs Assessment: post-procedure vital signs reviewed and stable Respiratory status: spontaneous breathing, nonlabored ventilation, respiratory function stable and patient connected to nasal cannula oxygen Cardiovascular status: blood pressure returned to baseline and stable Postop Assessment: no apparent nausea or vomiting Anesthetic complications: no   No complications documented.  Last Vitals:  Vitals:   05/25/20 1110 05/25/20 1113  BP: (!) 134/58 (!) 144/65  Pulse: (!) 59 (!) 59  Resp: 20 13  Temp:    SpO2: 100% 100%    Last Pain:  Vitals:   05/26/20 1433  TempSrc:   PainSc: 4                  Abelardo Seidner P Gladys Deckard

## 2020-05-29 NOTE — ED Notes (Signed)
Pt and wife provided with drink per request

## 2020-06-07 ENCOUNTER — Ambulatory Visit (HOSPITAL_COMMUNITY): Payer: Medicare Other | Admitting: Nurse Practitioner

## 2020-06-12 ENCOUNTER — Ambulatory Visit: Payer: Medicare Other | Admitting: Internal Medicine

## 2020-06-12 ENCOUNTER — Encounter: Payer: Self-pay | Admitting: Internal Medicine

## 2020-06-12 ENCOUNTER — Other Ambulatory Visit: Payer: Self-pay

## 2020-06-12 VITALS — BP 128/70 | HR 73 | Ht 71.0 in | Wt 202.4 lb

## 2020-06-12 DIAGNOSIS — D6869 Other thrombophilia: Secondary | ICD-10-CM

## 2020-06-12 DIAGNOSIS — I442 Atrioventricular block, complete: Secondary | ICD-10-CM | POA: Diagnosis not present

## 2020-06-12 DIAGNOSIS — I5032 Chronic diastolic (congestive) heart failure: Secondary | ICD-10-CM

## 2020-06-12 DIAGNOSIS — I4819 Other persistent atrial fibrillation: Secondary | ICD-10-CM

## 2020-06-12 LAB — CUP PACEART INCLINIC DEVICE CHECK
Battery Remaining Longevity: 0 mo
Battery Voltage: 2.59 V
Brady Statistic RA Percent Paced: 8.1 %
Brady Statistic RV Percent Paced: 99.77 %
Date Time Interrogation Session: 20220214091800
Implantable Lead Implant Date: 20131203
Implantable Lead Implant Date: 20131203
Implantable Lead Location: 753859
Implantable Lead Location: 753860
Implantable Lead Model: 1948
Implantable Pulse Generator Implant Date: 20131203
Lead Channel Impedance Value: 425 Ohm
Lead Channel Impedance Value: 637.5 Ohm
Lead Channel Pacing Threshold Amplitude: 0.875 V
Lead Channel Pacing Threshold Pulse Width: 0.8 ms
Lead Channel Sensing Intrinsic Amplitude: 2.1 mV
Lead Channel Setting Pacing Amplitude: 1.125
Lead Channel Setting Pacing Amplitude: 1.75 V
Lead Channel Setting Pacing Pulse Width: 0.8 ms
Lead Channel Setting Sensing Sensitivity: 4 mV
Pulse Gen Model: 2210
Pulse Gen Serial Number: 7408461

## 2020-06-12 NOTE — H&P (View-Only) (Signed)
PCP: Velna Hatchet, MD Primary Cardiologist: Dr Haroldine Laws Primary EP:  Dr Rayann Heman  [name] is a 70 y.o. male who presents today for routine electrophysiology followup.  Since his ablation, the patient reports doing reasonably well.  He has had ERAF.  + ongoing SOB and fatigue.  Today, he denies symptoms of palpitations, chest pain,  lower extremity edema, dizziness, presyncope, or syncope.  The patient is otherwise without complaint today.   Past Medical History:  Diagnosis Date  . Aortic stenosis, mild   . Arthritis of hand    "just a little bit in both hands" (03/31/2012)  . Asthma    "little bit" (03/31/2012)  . Atrial fibrillation [facility])    dx '04; DCCV '04, placed on flecainide, failed DCCV 04/2010, flecainide stopped; s/p successful A.fib ablation 01/31/12  . CHF (congestive heart failure) ([place])   . Controlled type 2 diabetes mellitus without complication, without long-term current use of insulin ([place]) 12/13/2019  . COPD (chronic obstructive pulmonary disease) ([place])   . DJD (degenerative joint disease)   . Fibromyalgia   . GERD (gastroesophageal reflux disease)   . Hyperlipidemia   . Hypertension   . Hypothyroidism    S/P radiation  . Left atrial enlargement    LA size 47mm by echo 11/21/11  . Mitral regurgitation    trivial  . Obstructive sleep apnea    mild by sleep study 2013; pt stated he does not have a machine because "it wasn't bad enough for him to have one"  . Pacemaker 03/31/2012  . Restless leg syndrome   . Second degree AV block   . Splenomegaly   . Visit for monitoring Tikosyn therapy 04/16/2016   Past Surgical History:  Procedure Laterality Date  . AORTIC VALVE REPLACEMENT N/A 08/22/2015   Procedure: AORTIC VALVE REPLACEMENT (AVR);  Surgeon: Ivin Poot, MD;  Location: [place];  Service: Open Heart Surgery;  Laterality: N/A;  . ATRIAL FIBRILLATION ABLATION  01/30/2012   PVI by Dr. Rayann Heman  . ATRIAL FIBRILLATION ABLATION N/A 01/31/2012   Procedure:  ATRIAL FIBRILLATION ABLATION;  Surgeon: Thompson Grayer, MD;  Location: [facility] CATH LAB;  Service: Cardiovascular;  Laterality: N/A;  . ATRIAL FIBRILLATION ABLATION N/A 04/19/2020   Procedure: ATRIAL FIBRILLATION ABLATION;  Surgeon: Thompson Grayer, MD;  Location: [place] CV LAB;  Service: Cardiovascular;  Laterality: N/A;  . BACK SURGERY     X 3  . CARDIAC CATHETERIZATION N/A 08/09/2015   Procedure: Right/Left Heart Cath and Coronary Angiography;  Surgeon: Peter M Martinique, MD;  Location: [place] CV LAB;  Service: Cardiovascular;  Laterality: N/A;  . CARDIAC CATHETERIZATION N/A 02/05/2016   Procedure: Right/Left Heart Cath and Coronary Angiography;  Surgeon: Jettie Booze, MD;  Location: [place] CV LAB;  Service: Cardiovascular;  Laterality: N/A;  . CARDIAC CATHETERIZATION N/A 04/17/2016   Procedure: Right Heart Cath;  Surgeon: Jolaine Artist, MD;  Location: [place] CV LAB;  Service: Cardiovascular;  Laterality: N/A;  . CARDIOVASCULAR STRESS TEST  03/12/2002   EF 48%, NO EVIDENCE OF ISCHEMIA  . CARDIOVERSION  01/2012; 03/31/2012  . CARDIOVERSION N/A 02/25/2012   Procedure: CARDIOVERSION;  Surgeon: Thompson Grayer, MD;  Location: [facility] CATH LAB;  Service: Cardiovascular;  Laterality: N/A;  . CARDIOVERSION N/A 03/31/2012   Procedure: CARDIOVERSION;  Surgeon: Thompson Grayer, MD;  Location: [facility] CATH LAB;  Service: Cardiovascular;  Laterality: N/A;  . CARDIOVERSION N/A 11/23/2015   Procedure: CARDIOVERSION;  Surgeon: Larey Dresser, MD;  Location: [facility]  ENDOSCOPY;  Service: Cardiovascular;  Laterality: N/A;  . CARDIOVERSION N/A 04/08/2016   Procedure: CARDIOVERSION;  Surgeon: Jolaine Artist, MD;  Location: [place];  Service: Cardiovascular;  Laterality: N/A;  . CARDIOVERSION N/A 09/14/2018   Procedure: CARDIOVERSION;  Surgeon: Josue Hector, MD;  Location: [facility] ENDOSCOPY;  Service: Cardiovascular;  Laterality: N/A;  . CARDIOVERSION N/A 10/19/2019   Procedure: CARDIOVERSION;  Surgeon:  Jolaine Artist, MD;  Location: [facility] ENDOSCOPY;  Service: Cardiovascular;  Laterality: N/A;  . CARDIOVERSION N/A 02/25/2020   Procedure: CARDIOVERSION;  Surgeon: Fay Records, MD;  Location: [place];  Service: Cardiovascular;  Laterality: N/A;  . CARDIOVERSION N/A 05/25/2020   Procedure: CARDIOVERSION;  Surgeon: Lelon Perla, MD;  Location: [facility] ENDOSCOPY;  Service: Cardiovascular;  Laterality: N/A;  . CLIPPING OF ATRIAL APPENDAGE N/A 08/22/2015   Procedure: CLIPPING OF ATRIAL APPENDAGE;  Surgeon: Ivin Poot, MD;  Location: [place];  Service: Open Heart Surgery;  Laterality: N/A;  . DOPPLER ECHOCARDIOGRAPHY  03/11/2002   EF 70-75%  . FINGER SURGERY Left    Middle finger  . FINGER TENDON REPAIR  1980's   "right little finger" (03/31/2012)  . INSERT / REPLACE / REMOVE PACEMAKER     [employer]  . IR RADIOLOGIST EVAL & MGMT  05/06/2019  . IR RADIOLOGIST EVAL & MGMT  10/06/2019  . LEFT AND RIGHT HEART CATHETERIZATION WITH CORONARY ANGIOGRAM N/A 10/27/2012   Procedure: LEFT AND RIGHT HEART CATHETERIZATION WITH CORONARY ANGIOGRAM;  Surgeon: Laverda Page, MD;  Location: [facility] CATH LAB;  Service: Cardiovascular;  Laterality: N/A;  . LUMBAR DISC SURGERY  1980's X2;  2000's  . MAZE N/A 08/22/2015   Procedure: MAZE;  Surgeon: Ivin Poot, MD;  Location: [place];  Service: Open Heart Surgery;  Laterality: N/A;  . PACEMAKER INSERTION  03/31/2012   STJ Accent DR pacemaker implanted by Dr Rayann Heman  . PERMANENT PACEMAKER INSERTION N/A 03/31/2012   Procedure: PERMANENT PACEMAKER INSERTION;  Surgeon: Thompson Grayer, MD;  Location: [place] CATH LAB;  Service: Cardiovascular;  Laterality: N/A;  . TEE WITHOUT CARDIOVERSION  01/30/2012   Procedure: TRANSESOPHAGEAL ECHOCARDIOGRAM (TEE);  Surgeon: Larey Dresser, MD;  Location: [place];  Service: Cardiovascular;  Laterality: N/A;  ablation next day  . TEE WITHOUT CARDIOVERSION N/A 08/22/2015   Procedure: TRANSESOPHAGEAL ECHOCARDIOGRAM (TEE);  Surgeon: Ivin Poot, MD;  Location: [place];  Service: Open Heart Surgery;  Laterality: N/A;  . TEE WITHOUT CARDIOVERSION N/A 10/19/2019   Procedure: TRANSESOPHAGEAL ECHOCARDIOGRAM (TEE);  Surgeon: Jolaine Artist, MD;  Location: [facility] ENDOSCOPY;  Service: Cardiovascular;  Laterality: N/A;  . US ECHOCARDIOGRAPHY  08/07/2009   EF 55-60%    ROS- all systems are reviewed and negative except as per HPI above  Current Outpatient Medications  Medication Sig Dispense Refill  . albuterol (PROVENTIL HFA;VENTOLIN HFA) 108 (90 Base) MCG/ACT inhaler Inhale 2 puffs into the lungs every 6 (six) hours as needed for wheezing or shortness of breath. 1 Inhaler 5  . amLODipine (NORVASC) 10 MG tablet Take 0.5 tablets (5 mg total) by mouth daily. 30 tablet 11  . apixaban (ELIQUIS) 5 MG TABS tablet Take 5 mg by mouth 2 (two) times daily.    . bisoprolol (ZEBETA) 5 MG tablet Take 5 mg by mouth daily.    . Budeson-Glycopyrrol-Formoterol (BREZTRI AEROSPHERE) 160-9-4.8 MCG/ACT AERO Inhale 2 puffs into the lungs in the morning and at bedtime. 5.9 g 0  . Cholecalciferol (VITAMIN D) 2000 UNITS CAPS Take 8,000 Units  by mouth at bedtime.     . dofetilide (TIKOSYN) 250 MCG capsule Take 1 capsule (250 mcg total) by mouth 2 (two) times daily. 180 capsule 1  . doxycycline (VIBRAMYCIN) 100 MG capsule Take 1 capsule (100 mg total) by mouth 2 (two) times daily. 20 capsule 0  . empagliflozin (JARDIANCE) 10 MG TABS tablet Take 1 tablet (10 mg total) by mouth daily before breakfast. 30 tablet 6  . fexofenadine (ALLEGRA) 180 MG tablet Take 1 tablet (180 mg total) by mouth daily as needed for allergies or rhinitis.    [name] gabapentin (NEURONTIN) 300 MG capsule Take 600-1,200 mg by mouth See admin instructions. Take 600 mg by mouth in the morning, 600 mg at lunchtime, and take 1200 mg at bedtime  3  . guaifenesin (HUMIBID E) 400 MG TABS tablet Take 800 mg by mouth 2 (two) times daily.    [name] levothyroxine (SYNTHROID, LEVOTHROID) 175 MCG tablet Take 175 mcg  by mouth daily before breakfast.    . Multiple Minerals-Vitamins (CAL-MAG-ZINC-D) TABS Take 1 tablet by mouth daily.    . Multiple Vitamin (MULTIVITAMIN WITH MINERALS) TABS tablet Take 1 tablet by mouth daily. 30 tablet 0  . ondansetron (ZOFRAN-ODT) 4 MG disintegrating tablet Take 4 mg by mouth every 8 (eight) hours as needed for nausea or vomiting.    [name] oxyCODONE-acetaminophen (PERCOCET/ROXICET) 5-325 MG tablet Take 1 tablet by mouth every 12 (twelve) hours.    . OXYGEN Inhale 2 L into the lungs continuous.    . pantoprazole (PROTONIX) 40 MG tablet Take 40 mg by mouth daily.    . polyethylene glycol powder (GLYCOLAX/MIRALAX) 17 GM/SCOOP powder Take 17 g by mouth daily as needed for mild constipation.    . Polyvinyl Alcohol-Povidone (REFRESH OP) Place 1 drop into both eyes daily as needed (dry eyes).    . potassium chloride (KLOR-CON) 10 MEQ tablet Take 20 mEq by mouth See admin instructions. Take 20 meq twice every other day (take only on torsemide days)    . predniSONE (DELTASONE) 5 MG tablet Take 1 tablet (5 mg total) by mouth daily with breakfast. 30 tablet 3  . protein supplement shake (PREMIER PROTEIN) LIQD Take 11 oz by mouth daily.    [name] rOPINIRole (REQUIP) 1 MG tablet Take 1 tablet (1 mg total) by mouth in the morning and at bedtime.    . rosuvastatin (CRESTOR) 10 MG tablet Take 10 mg by mouth at bedtime.     [name] spironolactone (ALDACTONE) 25 MG tablet Take 1 tablet (25 mg total) by mouth daily. 90 tablet 3  . torsemide (DEMADEX) 20 MG tablet Take 40 mg by mouth every other day.    . traMADol-acetaminophen (ULTRACET) 37.5-325 MG tablet Take 2 tablets by mouth 2 (two) times daily.     No current facility-administered medications for this visit.    Physical Exam: Vitals:   06/12/20 0913  BP: 128/70  Pulse: 73  SpO2: 96%  Weight: 202 lb 6.4 oz (91.8 kg)  Height: 5\' 11"  (1.803 m)    GEN- The patient is well appearing, alert and oriented x 3 today.  Wearing O2. Head- normocephalic,  atraumatic Eyes-  Sclera clear, conjunctiva pink Ears- hearing intact Oropharynx- clear Lungs-   normal work of breathing Chest- pacemaker pocket is well healed Heart- Regular rate and rhythm (paced) GI- soft  Extremities- no clubbing, cyanosis, or edema  Pacemaker interrogation- reviewed in detail today,  See PACEART report  ekg tracing ordered today is personally reviewed and shows afib,  V paced  Assessment and Plan:  1. Symptomatic complete heart block Normal pacemaker function See Pace Art report No changes today he is device dependant today Approaching ERI (longevity <3 months) We will follow him closely and proceed with generator change once ERI.  We will not be able to hold eliquis until at least 8 weeks post ablation.  2. Persistent afib Continues to have afib despite tikosyn. Remains within the 3 month healing post ablation so I will remain optistic.   He is s/p prior MAZE and PVI as well.  He is rate controlled.  AV nodal ablation is not an option. chads2vasc score is 4.  He is on eliquis Continue current approach.  Proceed with cardioversion after 4 more weeks of healing Risks of cardioversion were discussed with patient and wife who wish to proceed.  Risks, benefits and potential toxicities for medications prescribed and/or refilled reviewed with patient today.   Return to see me in 5 weeks  Thompson Grayer MD, [facility] 06/12/2020 9:28 AM

## 2020-06-12 NOTE — Progress Notes (Signed)
PCP: Velna Hatchet, MD Primary Cardiologist: Dr Haroldine Laws Primary EP:  Dr Rayann Heman  [name] is a 70 y.o. male who presents today for routine electrophysiology followup.  Since his ablation, the patient reports doing reasonably well.  He has had ERAF.  + ongoing SOB and fatigue.  Today, he denies symptoms of palpitations, chest pain,  lower extremity edema, dizziness, presyncope, or syncope.  The patient is otherwise without complaint today.   Past Medical History:  Diagnosis Date  . Aortic stenosis, mild   . Arthritis of hand    "just a little bit in both hands" (03/31/2012)  . Asthma    "little bit" (03/31/2012)  . Atrial fibrillation [facility])    dx '04; DCCV '04, placed on flecainide, failed DCCV 04/2010, flecainide stopped; s/p successful A.fib ablation 01/31/12  . CHF (congestive heart failure) ([place])   . Controlled type 2 diabetes mellitus without complication, without long-term current use of insulin ([place]) 12/13/2019  . COPD (chronic obstructive pulmonary disease) ([place])   . DJD (degenerative joint disease)   . Fibromyalgia   . GERD (gastroesophageal reflux disease)   . Hyperlipidemia   . Hypertension   . Hypothyroidism    S/P radiation  . Left atrial enlargement    LA size 31mm by echo 11/21/11  . Mitral regurgitation    trivial  . Obstructive sleep apnea    mild by sleep study 2013; pt stated he does not have a machine because "it wasn't bad enough for him to have one"  . Pacemaker 03/31/2012  . Restless leg syndrome   . Second degree AV block   . Splenomegaly   . Visit for monitoring Tikosyn therapy 04/16/2016   Past Surgical History:  Procedure Laterality Date  . AORTIC VALVE REPLACEMENT N/A 08/22/2015   Procedure: AORTIC VALVE REPLACEMENT (AVR);  Surgeon: Ivin Poot, MD;  Location: [place];  Service: Open Heart Surgery;  Laterality: N/A;  . ATRIAL FIBRILLATION ABLATION  01/30/2012   PVI by Dr. Rayann Heman  . ATRIAL FIBRILLATION ABLATION N/A 01/31/2012   Procedure:  ATRIAL FIBRILLATION ABLATION;  Surgeon: Thompson Grayer, MD;  Location: [facility] CATH LAB;  Service: Cardiovascular;  Laterality: N/A;  . ATRIAL FIBRILLATION ABLATION N/A 04/19/2020   Procedure: ATRIAL FIBRILLATION ABLATION;  Surgeon: Thompson Grayer, MD;  Location: [place] CV LAB;  Service: Cardiovascular;  Laterality: N/A;  . BACK SURGERY     X 3  . CARDIAC CATHETERIZATION N/A 08/09/2015   Procedure: Right/Left Heart Cath and Coronary Angiography;  Surgeon: Peter M Martinique, MD;  Location: [place] CV LAB;  Service: Cardiovascular;  Laterality: N/A;  . CARDIAC CATHETERIZATION N/A 02/05/2016   Procedure: Right/Left Heart Cath and Coronary Angiography;  Surgeon: Jettie Booze, MD;  Location: [place] CV LAB;  Service: Cardiovascular;  Laterality: N/A;  . CARDIAC CATHETERIZATION N/A 04/17/2016   Procedure: Right Heart Cath;  Surgeon: Jolaine Artist, MD;  Location: [place] CV LAB;  Service: Cardiovascular;  Laterality: N/A;  . CARDIOVASCULAR STRESS TEST  03/12/2002   EF 48%, NO EVIDENCE OF ISCHEMIA  . CARDIOVERSION  01/2012; 03/31/2012  . CARDIOVERSION N/A 02/25/2012   Procedure: CARDIOVERSION;  Surgeon: Thompson Grayer, MD;  Location: [facility] CATH LAB;  Service: Cardiovascular;  Laterality: N/A;  . CARDIOVERSION N/A 03/31/2012   Procedure: CARDIOVERSION;  Surgeon: Thompson Grayer, MD;  Location: [facility] CATH LAB;  Service: Cardiovascular;  Laterality: N/A;  . CARDIOVERSION N/A 11/23/2015   Procedure: CARDIOVERSION;  Surgeon: Larey Dresser, MD;  Location: [facility]  ENDOSCOPY;  Service: Cardiovascular;  Laterality: N/A;  . CARDIOVERSION N/A 04/08/2016   Procedure: CARDIOVERSION;  Surgeon: Jolaine Artist, MD;  Location: [place];  Service: Cardiovascular;  Laterality: N/A;  . CARDIOVERSION N/A 09/14/2018   Procedure: CARDIOVERSION;  Surgeon: Josue Hector, MD;  Location: [facility] ENDOSCOPY;  Service: Cardiovascular;  Laterality: N/A;  . CARDIOVERSION N/A 10/19/2019   Procedure: CARDIOVERSION;  Surgeon:  Jolaine Artist, MD;  Location: [facility] ENDOSCOPY;  Service: Cardiovascular;  Laterality: N/A;  . CARDIOVERSION N/A 02/25/2020   Procedure: CARDIOVERSION;  Surgeon: Fay Records, MD;  Location: [place];  Service: Cardiovascular;  Laterality: N/A;  . CARDIOVERSION N/A 05/25/2020   Procedure: CARDIOVERSION;  Surgeon: Lelon Perla, MD;  Location: [facility] ENDOSCOPY;  Service: Cardiovascular;  Laterality: N/A;  . CLIPPING OF ATRIAL APPENDAGE N/A 08/22/2015   Procedure: CLIPPING OF ATRIAL APPENDAGE;  Surgeon: Ivin Poot, MD;  Location: [place];  Service: Open Heart Surgery;  Laterality: N/A;  . DOPPLER ECHOCARDIOGRAPHY  03/11/2002   EF 70-75%  . FINGER SURGERY Left    Middle finger  . FINGER TENDON REPAIR  1980's   "right little finger" (03/31/2012)  . INSERT / REPLACE / REMOVE PACEMAKER     [employer]  . IR RADIOLOGIST EVAL & MGMT  05/06/2019  . IR RADIOLOGIST EVAL & MGMT  10/06/2019  . LEFT AND RIGHT HEART CATHETERIZATION WITH CORONARY ANGIOGRAM N/A 10/27/2012   Procedure: LEFT AND RIGHT HEART CATHETERIZATION WITH CORONARY ANGIOGRAM;  Surgeon: Laverda Page, MD;  Location: [facility] CATH LAB;  Service: Cardiovascular;  Laterality: N/A;  . LUMBAR DISC SURGERY  1980's X2;  2000's  . MAZE N/A 08/22/2015   Procedure: MAZE;  Surgeon: Ivin Poot, MD;  Location: [place];  Service: Open Heart Surgery;  Laterality: N/A;  . PACEMAKER INSERTION  03/31/2012   STJ Accent DR pacemaker implanted by Dr Rayann Heman  . PERMANENT PACEMAKER INSERTION N/A 03/31/2012   Procedure: PERMANENT PACEMAKER INSERTION;  Surgeon: Thompson Grayer, MD;  Location: [facility] CATH LAB;  Service: Cardiovascular;  Laterality: N/A;  . TEE WITHOUT CARDIOVERSION  01/30/2012   Procedure: TRANSESOPHAGEAL ECHOCARDIOGRAM (TEE);  Surgeon: Larey Dresser, MD;  Location: [place];  Service: Cardiovascular;  Laterality: N/A;  ablation next day  . TEE WITHOUT CARDIOVERSION N/A 08/22/2015   Procedure: TRANSESOPHAGEAL ECHOCARDIOGRAM (TEE);  Surgeon: Ivin Poot, MD;  Location: [place];  Service: Open Heart Surgery;  Laterality: N/A;  . TEE WITHOUT CARDIOVERSION N/A 10/19/2019   Procedure: TRANSESOPHAGEAL ECHOCARDIOGRAM (TEE);  Surgeon: Jolaine Artist, MD;  Location: [facility] ENDOSCOPY;  Service: Cardiovascular;  Laterality: N/A;  . US ECHOCARDIOGRAPHY  08/07/2009   EF 55-60%    ROS- all systems are reviewed and negative except as per HPI above  Current Outpatient Medications  Medication Sig Dispense Refill  . albuterol (PROVENTIL HFA;VENTOLIN HFA) 108 (90 Base) MCG/ACT inhaler Inhale 2 puffs into the lungs every 6 (six) hours as needed for wheezing or shortness of breath. 1 Inhaler 5  . amLODipine (NORVASC) 10 MG tablet Take 0.5 tablets (5 mg total) by mouth daily. 30 tablet 11  . apixaban (ELIQUIS) 5 MG TABS tablet Take 5 mg by mouth 2 (two) times daily.    . bisoprolol (ZEBETA) 5 MG tablet Take 5 mg by mouth daily.    . Budeson-Glycopyrrol-Formoterol (BREZTRI AEROSPHERE) 160-9-4.8 MCG/ACT AERO Inhale 2 puffs into the lungs in the morning and at bedtime. 5.9 g 0  . Cholecalciferol (VITAMIN D) 2000 UNITS CAPS Take 8,000 Units  by mouth at bedtime.     . dofetilide (TIKOSYN) 250 MCG capsule Take 1 capsule (250 mcg total) by mouth 2 (two) times daily. 180 capsule 1  . doxycycline (VIBRAMYCIN) 100 MG capsule Take 1 capsule (100 mg total) by mouth 2 (two) times daily. 20 capsule 0  . empagliflozin (JARDIANCE) 10 MG TABS tablet Take 1 tablet (10 mg total) by mouth daily before breakfast. 30 tablet 6  . fexofenadine (ALLEGRA) 180 MG tablet Take 1 tablet (180 mg total) by mouth daily as needed for allergies or rhinitis.    [name] gabapentin (NEURONTIN) 300 MG capsule Take 600-1,200 mg by mouth See admin instructions. Take 600 mg by mouth in the morning, 600 mg at lunchtime, and take 1200 mg at bedtime  3  . guaifenesin (HUMIBID E) 400 MG TABS tablet Take 800 mg by mouth 2 (two) times daily.    [name] levothyroxine (SYNTHROID, LEVOTHROID) 175 MCG tablet Take 175 mcg  by mouth daily before breakfast.    . Multiple Minerals-Vitamins (CAL-MAG-ZINC-D) TABS Take 1 tablet by mouth daily.    . Multiple Vitamin (MULTIVITAMIN WITH MINERALS) TABS tablet Take 1 tablet by mouth daily. 30 tablet 0  . ondansetron (ZOFRAN-ODT) 4 MG disintegrating tablet Take 4 mg by mouth every 8 (eight) hours as needed for nausea or vomiting.    [name] oxyCODONE-acetaminophen (PERCOCET/ROXICET) 5-325 MG tablet Take 1 tablet by mouth every 12 (twelve) hours.    . OXYGEN Inhale 2 L into the lungs continuous.    . pantoprazole (PROTONIX) 40 MG tablet Take 40 mg by mouth daily.    . polyethylene glycol powder (GLYCOLAX/MIRALAX) 17 GM/SCOOP powder Take 17 g by mouth daily as needed for mild constipation.    . Polyvinyl Alcohol-Povidone (REFRESH OP) Place 1 drop into both eyes daily as needed (dry eyes).    . potassium chloride (KLOR-CON) 10 MEQ tablet Take 20 mEq by mouth See admin instructions. Take 20 meq twice every other day (take only on torsemide days)    . predniSONE (DELTASONE) 5 MG tablet Take 1 tablet (5 mg total) by mouth daily with breakfast. 30 tablet 3  . protein supplement shake (PREMIER PROTEIN) LIQD Take 11 oz by mouth daily.    [name] rOPINIRole (REQUIP) 1 MG tablet Take 1 tablet (1 mg total) by mouth in the morning and at bedtime.    . rosuvastatin (CRESTOR) 10 MG tablet Take 10 mg by mouth at bedtime.     [name] spironolactone (ALDACTONE) 25 MG tablet Take 1 tablet (25 mg total) by mouth daily. 90 tablet 3  . torsemide (DEMADEX) 20 MG tablet Take 40 mg by mouth every other day.    . traMADol-acetaminophen (ULTRACET) 37.5-325 MG tablet Take 2 tablets by mouth 2 (two) times daily.     No current facility-administered medications for this visit.    Physical Exam: Vitals:   06/12/20 0913  BP: 128/70  Pulse: 73  SpO2: 96%  Weight: 202 lb 6.4 oz (91.8 kg)  Height: 5\' 11"  (1.803 m)    GEN- The patient is well appearing, alert and oriented x 3 today.  Wearing O2. Head- normocephalic,  atraumatic Eyes-  Sclera clear, conjunctiva pink Ears- hearing intact Oropharynx- clear Lungs-   normal work of breathing Chest- pacemaker pocket is well healed Heart- Regular rate and rhythm (paced) GI- soft  Extremities- no clubbing, cyanosis, or edema  Pacemaker interrogation- reviewed in detail today,  See PACEART report  ekg tracing ordered today is personally reviewed and shows afib,  V paced  Assessment and Plan:  1. Symptomatic complete heart block Normal pacemaker function See Pace Art report No changes today he is device dependant today Approaching ERI (longevity <3 months) We will follow him closely and proceed with generator change once ERI.  We will not be able to hold eliquis until at least 8 weeks post ablation.  2. Persistent afib Continues to have afib despite tikosyn. Remains within the 3 month healing post ablation so I will remain optistic.   He is s/p prior MAZE and PVI as well.  He is rate controlled.  AV nodal ablation is not an option. chads2vasc score is 4.  He is on eliquis Continue current approach.  Proceed with cardioversion after 4 more weeks of healing Risks of cardioversion were discussed with patient and wife who wish to proceed.  Risks, benefits and potential toxicities for medications prescribed and/or refilled reviewed with patient today.   Return to see me in 5 weeks  Thompson Grayer MD, [facility] 06/12/2020 9:28 AM

## 2020-06-12 NOTE — Patient Instructions (Addendum)
Medication Instructions:  Your physician recommends that you continue on your current medications as directed. Please refer to the Current Medication list given to you today.  Labwork: None ordered.  Testing/Procedures: Your physician has recommended that you have a Cardioversion (DCCV). Electrical Cardioversion uses a jolt of electricity to your heart either through paddles or wired patches attached to your chest. This is a controlled, usually prescheduled, procedure. Defibrillation is done under light anesthesia in the hospital, and you usually go home the day of the procedure. This is done to get your heart back into a normal rhythm. You are not awake for the procedure. Please see the instruction sheet given to you today.   Follow-Up: Your physician wants you to follow-up in: March 23 at 9:45 am with Dr. Rayann Heman.  Remote monitoring is used to monitor your Pacemaker from home. This monitoring reduces the number of office visits required to check your device to one time per year. It allows [place] to keep an eye on the functioning of your device to ensure it is working properly. You are scheduled for a device check from home on 06/15/20. You may send your transmission at any time that day. If you have a wireless device, the transmission will be sent automatically. After your physician reviews your transmission, you will receive a postcard with your next transmission date.  Any Other Special Instructions Will Be Listed Below (If Applicable).  If you need a refill on your cardiac medications before your next appointment, please call your pharmacy.          COVID TEST--Mar 12 at 11 am-- You will go to [place]. [place], [place] [address]  for your Covid testing.   This is a drive thru test site.   Be sure to share with the first checkpoint that you are there for pre-procedure/surgery testing. Stay in your car and the nurse team will come to your car to test you.  After you are tested please go  home and self-quarantine until the day of your procedure.     You are scheduled for a Cardioversion on March 14  with Dr. Marlou Porch.  Please arrive at the [facility] (Main Entrance A) at [facility]: [address] [place], [place] [address] at 8:30am.   DIET: Nothing to eat or drink after midnight except a sip of water with medications (see medication instructions below)  Medication Instructions: Hold Jardiance  Continue your anticoagulant: Eliquis You will need to continue your anticoagulant after your procedure until you  are told by your  Provider that it is safe to stop   You must have a responsible person to drive you home and stay in the waiting area during your procedure. Failure to do so could result in cancellation.  Bring your insurance cards.  *Special Note: Every effort is made to have your procedure done on time. Occasionally there are emergencies that occur at the hospital that may cause delays. Please be patient if a delay does occur.

## 2020-06-16 DIAGNOSIS — N1832 Chronic kidney disease, stage 3b: Secondary | ICD-10-CM | POA: Diagnosis not present

## 2020-06-16 DIAGNOSIS — I509 Heart failure, unspecified: Secondary | ICD-10-CM | POA: Diagnosis not present

## 2020-06-16 DIAGNOSIS — I129 Hypertensive chronic kidney disease with stage 1 through stage 4 chronic kidney disease, or unspecified chronic kidney disease: Secondary | ICD-10-CM | POA: Diagnosis not present

## 2020-06-19 ENCOUNTER — Ambulatory Visit: Payer: Medicare Other | Admitting: Primary Care

## 2020-06-19 ENCOUNTER — Other Ambulatory Visit (HOSPITAL_COMMUNITY): Payer: Self-pay | Admitting: *Deleted

## 2020-06-27 ENCOUNTER — Ambulatory Visit: Payer: Medicare Other | Admitting: Primary Care

## 2020-06-28 ENCOUNTER — Encounter (HOSPITAL_BASED_OUTPATIENT_CLINIC_OR_DEPARTMENT_OTHER): Payer: Self-pay | Admitting: Emergency Medicine

## 2020-06-28 ENCOUNTER — Other Ambulatory Visit: Payer: Self-pay

## 2020-06-28 ENCOUNTER — Emergency Department (HOSPITAL_BASED_OUTPATIENT_CLINIC_OR_DEPARTMENT_OTHER)
Admission: EM | Admit: 2020-06-28 | Discharge: 2020-06-28 | Disposition: A | Discharge status: Home / Self Care | Payer: Medicare Other | Attending: Emergency Medicine | Admitting: Emergency Medicine

## 2020-06-28 DIAGNOSIS — J45909 Unspecified asthma, uncomplicated: Secondary | ICD-10-CM | POA: Diagnosis not present

## 2020-06-28 DIAGNOSIS — Z950 Presence of cardiac pacemaker: Secondary | ICD-10-CM | POA: Insufficient documentation

## 2020-06-28 DIAGNOSIS — E039 Hypothyroidism, unspecified: Secondary | ICD-10-CM | POA: Diagnosis not present

## 2020-06-28 DIAGNOSIS — J449 Chronic obstructive pulmonary disease, unspecified: Secondary | ICD-10-CM | POA: Insufficient documentation

## 2020-06-28 DIAGNOSIS — I2510 Atherosclerotic heart disease of native coronary artery without angina pectoris: Secondary | ICD-10-CM | POA: Diagnosis not present

## 2020-06-28 DIAGNOSIS — Z7901 Long term (current) use of anticoagulants: Secondary | ICD-10-CM | POA: Insufficient documentation

## 2020-06-28 DIAGNOSIS — R040 Epistaxis: Secondary | ICD-10-CM | POA: Insufficient documentation

## 2020-06-28 DIAGNOSIS — I130 Hypertensive heart and chronic kidney disease with heart failure and stage 1 through stage 4 chronic kidney disease, or unspecified chronic kidney disease: Secondary | ICD-10-CM | POA: Insufficient documentation

## 2020-06-28 DIAGNOSIS — Z87891 Personal history of nicotine dependence: Secondary | ICD-10-CM | POA: Insufficient documentation

## 2020-06-28 DIAGNOSIS — E1122 Type 2 diabetes mellitus with diabetic chronic kidney disease: Secondary | ICD-10-CM | POA: Diagnosis not present

## 2020-06-28 DIAGNOSIS — I5033 Acute on chronic diastolic (congestive) heart failure: Secondary | ICD-10-CM | POA: Insufficient documentation

## 2020-06-28 DIAGNOSIS — N1832 Chronic kidney disease, stage 3b: Secondary | ICD-10-CM | POA: Insufficient documentation

## 2020-06-28 DIAGNOSIS — Z79899 Other long term (current) drug therapy: Secondary | ICD-10-CM | POA: Insufficient documentation

## 2020-06-28 MED ORDER — OXYMETAZOLINE HCL 0.05 % NA SOLN
2.0000 | Freq: Once | NASAL | Status: AC
  Administered 2020-06-28: 2 via NASAL
  Filled 2020-06-28: qty 30

## 2020-06-28 NOTE — ED Provider Notes (Signed)
[facility] EMERGENCY DEPARTMENT Provider Note   CSN: [id number] Arrival date & time: 06/28/20  3614     History Chief Complaint  Patient presents with  . Epistaxis    [name] is a 70 y.o. male with a history of A. fib on Eliquis, on home oxygen at night, presentin to the emergency department with nosebleed.  Patient reports that for the past 3 months he has had intermittent spontaneous nosebleeds.  They tend to occur out of the right nares, but this morning when he woke up he was bleeding out of both nares.  He says these bleeds can occur at any time, but seem to occur mostly at night when in bed, and in the early morning.  He does wear supplemental oxygen at night when he is sleeping with a nasal cannula.  He does not wear oxygen in the daytime.  He and his wife state his house is very dry.  They do not use the modifier.  He has not been on any new medications recently.  He has been taking his Eliquis for many years he reports.  He has not had any issues with nosebleeds prior to this.  He is unaware of any history of nasal polyps.   This morning he began having a bleed.  He stopped the piece of paper towel in his nose to clot it.  The bleeding has stopped since his arrival in the ED.  He is not lightheaded.  HPI     Past Medical History:  Diagnosis Date  . Aortic stenosis, mild   . Arthritis of hand    "just a little bit in both hands" (03/31/2012)  . Asthma    "little bit" (03/31/2012)  . Atrial fibrillation [facility])    dx '04; DCCV '04, placed on flecainide, failed DCCV 04/2010, flecainide stopped; s/p successful A.fib ablation 01/31/12  . CHF (congestive heart failure) ([place])   . Controlled type 2 diabetes mellitus without complication, without long-term current use of insulin ([place]) 12/13/2019  . COPD (chronic obstructive pulmonary disease) ([place])   . DJD (degenerative joint disease)   . Fibromyalgia   . GERD (gastroesophageal reflux disease)   . Hyperlipidemia    . Hypertension   . Hypothyroidism    S/P radiation  . Left atrial enlargement    LA size 23mm by echo 11/21/11  . Mitral regurgitation    trivial  . Obstructive sleep apnea    mild by sleep study 2013; pt stated he does not have a machine because "it wasn't bad enough for him to have one"  . Pacemaker 03/31/2012  . Restless leg syndrome   . Second degree AV block   . Splenomegaly   . Visit for monitoring Tikosyn therapy 04/16/2016    Patient Active Problem List   Diagnosis Date Noted  . Pre-operative respiratory examination 03/27/2020  . Acute on chronic diastolic (congestive) heart failure ([place]) 01/22/2020  . Abdominal pain 01/22/2020  . Hypothyroidism 12/13/2019  . Controlled type 2 diabetes mellitus with stage 3 chronic kidney disease, without long-term current use of insulin ([place]) 12/13/2019  . Chronic kidney disease, stage 3b ([place]) 12/13/2019  . Acute bacterial bronchitis 12/13/2019  . COPD exacerbation ([place]) 12/13/2019  . COPD with acute exacerbation ([place]) 12/12/2019  . COPD (chronic obstructive pulmonary disease) ([place]) 10/16/2019  . Typical atrial flutter ([place])   . Chronic respiratory failure with hypoxia ([place]) 05/23/2016  . CAD (coronary artery disease) 05/23/2016  . Persistent atrial fibrillation ([place])   .  Atypical atrial flutter ([place])   . Visit for monitoring Tikosyn therapy 04/16/2016  . Chronic diastolic CHF (congestive heart failure) ([place]) 02/03/2016  . Cough 12/06/2015  . Hallucinations 09/04/2015  . Lymphadenopathy 09/04/2015  . Ascending aortic aneurysm ([place]) 09/04/2015  . Hypoxia 09/02/2015  . S/P aortic valve replacement with bioprosthetic valve 09/02/2015  . Pleural effusion 09/02/2015  . S/P AVR 08/22/2015  . GERD without esophagitis 09/20/2014  . 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 Dyspnea 09/15/2013  . Pulmonary hypertension ([place]) 09/15/2013  . Shortness of breath 05/05/2013  . 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 Bradycardia 03/31/2012  . Second degree  AV block 03/31/2012  . AV block, 1st degree 02/01/2012  . PAF (paroxysmal atrial fibrillation) ([place]) 11/13/2011  . Fatigue 11/13/2011  . Hypertension 11/13/2011  . Aortic stenosis 11/13/2011  . Sleep apnea 11/13/2011    Past Surgical History:  Procedure Laterality Date  . AORTIC VALVE REPLACEMENT N/A 08/22/2015   Procedure: AORTIC VALVE REPLACEMENT (AVR);  Surgeon: Ivin Poot, MD;  Location: [facility];  Service: Open Heart Surgery;  Laterality: N/A;  . ATRIAL FIBRILLATION ABLATION  01/30/2012   PVI by Dr. Rayann Heman  . ATRIAL FIBRILLATION ABLATION N/A 01/31/2012   Procedure: ATRIAL FIBRILLATION ABLATION;  Surgeon: Thompson Grayer, MD;  Location: [facility] CATH LAB;  Service: Cardiovascular;  Laterality: N/A;  . ATRIAL FIBRILLATION ABLATION N/A 04/19/2020   Procedure: ATRIAL FIBRILLATION ABLATION;  Surgeon: Thompson Grayer, MD;  Location: [place] CV LAB;  Service: Cardiovascular;  Laterality: N/A;  . BACK SURGERY     X 3  . CARDIAC CATHETERIZATION N/A 08/09/2015   Procedure: Right/Left Heart Cath and Coronary Angiography;  Surgeon: Peter M Martinique, MD;  Location: [facility] CV LAB;  Service: Cardiovascular;  Laterality: N/A;  . CARDIAC CATHETERIZATION N/A 02/05/2016   Procedure: Right/Left Heart Cath and Coronary Angiography;  Surgeon: Jettie Booze, MD;  Location: [place] CV LAB;  Service: Cardiovascular;  Laterality: N/A;  . CARDIAC CATHETERIZATION N/A 04/17/2016   Procedure: Right Heart Cath;  Surgeon: Jolaine Artist, MD;  Location: [place] CV LAB;  Service: Cardiovascular;  Laterality: N/A;  . CARDIOVASCULAR STRESS TEST  03/12/2002   EF 48%, NO EVIDENCE OF ISCHEMIA  . CARDIOVERSION  01/2012; 03/31/2012  . CARDIOVERSION N/A 02/25/2012   Procedure: CARDIOVERSION;  Surgeon: Thompson Grayer, MD;  Location: [facility] CATH LAB;  Service: Cardiovascular;  Laterality: N/A;  . CARDIOVERSION N/A 03/31/2012   Procedure: CARDIOVERSION;  Surgeon: Thompson Grayer, MD;  Location: [facility] CATH LAB;  Service:  Cardiovascular;  Laterality: N/A;  . CARDIOVERSION N/A 11/23/2015   Procedure: CARDIOVERSION;  Surgeon: Larey Dresser, MD;  Location: [place];  Service: Cardiovascular;  Laterality: N/A;  . CARDIOVERSION N/A 04/08/2016   Procedure: CARDIOVERSION;  Surgeon: Jolaine Artist, MD;  Location: [place];  Service: Cardiovascular;  Laterality: N/A;  . CARDIOVERSION N/A 09/14/2018   Procedure: CARDIOVERSION;  Surgeon: Josue Hector, MD;  Location: [facility] ENDOSCOPY;  Service: Cardiovascular;  Laterality: N/A;  . CARDIOVERSION N/A 10/19/2019   Procedure: CARDIOVERSION;  Surgeon: Jolaine Artist, MD;  Location: [facility] ENDOSCOPY;  Service: Cardiovascular;  Laterality: N/A;  . CARDIOVERSION N/A 02/25/2020   Procedure: CARDIOVERSION;  Surgeon: Fay Records, MD;  Location: [place];  Service: Cardiovascular;  Laterality: N/A;  . CARDIOVERSION N/A 05/25/2020   Procedure: CARDIOVERSION;  Surgeon: Lelon Perla, MD;  Location: [facility] ENDOSCOPY;  Service: Cardiovascular;  Laterality: N/A;  . CLIPPING OF ATRIAL APPENDAGE N/A 08/22/2015   Procedure: CLIPPING OF ATRIAL APPENDAGE;  Surgeon: Ivin Poot, MD;  Location: [place] [place];  Service: Open Heart Surgery;  Laterality: N/A;  . DOPPLER ECHOCARDIOGRAPHY  03/11/2002   EF 70-75%  . FINGER SURGERY Left    Middle finger  . FINGER TENDON REPAIR  1980's   "right little finger" (03/31/2012)  . INSERT / REPLACE / REMOVE PACEMAKER     St Jude  . IR RADIOLOGIST EVAL & MGMT  05/06/2019  . IR RADIOLOGIST EVAL & MGMT  10/06/2019  . LEFT AND RIGHT HEART CATHETERIZATION WITH CORONARY ANGIOGRAM N/A 10/27/2012   Procedure: LEFT AND RIGHT HEART CATHETERIZATION WITH CORONARY ANGIOGRAM;  Surgeon: Laverda Page, MD;  Location: [facility] CATH LAB;  Service: Cardiovascular;  Laterality: N/A;  . LUMBAR DISC SURGERY  1980's X2;  2000's  . MAZE N/A 08/22/2015   Procedure: MAZE;  Surgeon: Ivin Poot, MD;  Location: [facility];  Service: Open Heart Surgery;  Laterality: N/A;  .  PACEMAKER INSERTION  03/31/2012   STJ Accent DR pacemaker implanted by Dr Rayann Heman  . PERMANENT PACEMAKER INSERTION N/A 03/31/2012   Procedure: PERMANENT PACEMAKER INSERTION;  Surgeon: Thompson Grayer, MD;  Location: [facility] CATH LAB;  Service: Cardiovascular;  Laterality: N/A;  . TEE WITHOUT CARDIOVERSION  01/30/2012   Procedure: TRANSESOPHAGEAL ECHOCARDIOGRAM (TEE);  Surgeon: Larey Dresser, MD;  Location: [facility];  Service: Cardiovascular;  Laterality: N/A;  ablation next day  . TEE WITHOUT CARDIOVERSION N/A 08/22/2015   Procedure: TRANSESOPHAGEAL ECHOCARDIOGRAM (TEE);  Surgeon: Ivin Poot, MD;  Location: [facility];  Service: Open Heart Surgery;  Laterality: N/A;  . TEE WITHOUT CARDIOVERSION N/A 10/19/2019   Procedure: TRANSESOPHAGEAL ECHOCARDIOGRAM (TEE);  Surgeon: Jolaine Artist, MD;  Location: [facility] ENDOSCOPY;  Service: Cardiovascular;  Laterality: N/A;  . US ECHOCARDIOGRAPHY  08/07/2009   EF 55-60%       Family History  Problem Relation Age of Onset  . Heart failure Mother   . Stroke Father     Social History   Tobacco Use  . Smoking status: Former Smoker    Packs/day: 1.00    Years: 25.00    Pack years: 25.00    Types: Cigarettes    Quit date: 07/18/1991    Years since quitting: 28.9  . Smokeless tobacco: Never Used  Vaping Use  . Vaping Use: Never used  Substance Use Topics  . Alcohol use: Not Currently    Comment: occasional  . Drug use: Yes    Types: Marijuana    Comment: rarely use    Home Medications Prior to Admission medications   Medication Sig Start Date End Date Taking? Authorizing Provider  albuterol (PROVENTIL HFA;VENTOLIN HFA) 108 (90 Base) MCG/ACT inhaler Inhale 2 puffs into the lungs every 6 (six) hours as needed for wheezing or shortness of breath. 12/28/15   Collene Gobble, MD  amLODipine (NORVASC) 10 MG tablet Take 0.5 tablets (5 mg total) by mouth daily. 09/18/15   Nahser, Wonda Cheng, MD  apixaban (ELIQUIS) 5 MG TABS tablet Take 5 mg by mouth 2 (two)  times daily.    [provider]  bisoprolol (ZEBETA) 5 MG tablet Take 5 mg by mouth daily.    [provider]  Budeson-Glycopyrrol-Formoterol (BREZTRI AEROSPHERE) 160-9-4.8 MCG/ACT AERO Inhale 2 puffs into the lungs in the morning and at bedtime. 03/24/20   Martyn Ehrich, NP  Cholecalciferol (VITAMIN D) 2000 UNITS CAPS Take 8,000 Units by mouth at bedtime.     [provider]  dofetilide (TIKOSYN) 250 MCG capsule Take  1 capsule (250 mcg total) by mouth 2 (two) times daily. 05/10/20   Sherran Needs, NP  doxycycline (VIBRAMYCIN) 100 MG capsule Take 1 capsule (100 mg total) by mouth 2 (two) times daily. 05/29/20   Horton, Kristie M, DO  empagliflozin (JARDIANCE) 10 MG TABS tablet Take 1 tablet (10 mg total) by mouth daily before breakfast. 03/14/20   Bensimhon, Shaune Pascal, MD  fexofenadine (ALLEGRA) 180 MG tablet Take 1 tablet (180 mg total) by mouth daily as needed for allergies or rhinitis. 01/23/20   Aline August, MD  gabapentin (NEURONTIN) 300 MG capsule Take 600-1,200 mg by mouth See admin instructions. Take 600 mg by mouth in the morning, 600 mg at lunchtime, and take 1200 mg at bedtime 09/13/14   [provider]  guaifenesin (HUMIBID E) 400 MG TABS tablet Take 800 mg by mouth 2 (two) times daily.    [provider]  levothyroxine (SYNTHROID, LEVOTHROID) 175 MCG tablet Take 175 mcg by mouth daily before breakfast.    [provider]  Multiple Minerals-Vitamins (CAL-MAG-ZINC-D) TABS Take 1 tablet by mouth daily.    [provider]  Multiple Vitamin (MULTIVITAMIN WITH MINERALS) TABS tablet Take 1 tablet by mouth daily. 12/15/19   Sheikh, Georgina Quint Latif, DO  ondansetron (ZOFRAN-ODT) 4 MG disintegrating tablet Take 4 mg by mouth every 8 (eight) hours as needed for nausea or vomiting.    [provider]  oxyCODONE-acetaminophen (PERCOCET/ROXICET) 5-325 MG tablet Take 1 tablet by mouth every 12 (twelve) hours.    [provider]  OXYGEN Inhale 2 L into the lungs continuous.    [provider]  pantoprazole (PROTONIX) 40 MG tablet Take 40 mg by mouth daily.    [provider]  polyethylene glycol powder (GLYCOLAX/MIRALAX) 17 GM/SCOOP powder Take 17 g by mouth daily as needed for mild constipation. 01/23/20   Aline August, MD  Polyvinyl Alcohol-Povidone (REFRESH OP) Place 1 drop into both eyes daily as needed (dry eyes).    [provider]  potassium chloride (KLOR-CON) 10 MEQ tablet Take 20 mEq by mouth See admin instructions. Take 20 meq twice every other day (take only on torsemide days)    [provider]  predniSONE (DELTASONE) 5 MG tablet Take 1 tablet (5 mg total) by mouth daily with breakfast. 12/21/19   Raiford Noble Latif, DO  protein supplement shake (PREMIER PROTEIN) LIQD Take 11 oz by mouth daily.    [provider]  rOPINIRole (REQUIP) 1 MG tablet Take 1 tablet (1 mg total) by mouth in the morning and at bedtime. 01/23/20   Aline August, MD  rosuvastatin (CRESTOR) 10 MG tablet Take 10 mg by mouth at bedtime.  08/08/16   [provider]  spironolactone (ALDACTONE) 25 MG tablet Take 1 tablet (25 mg total) by mouth daily. 02/16/16   Shirley Friar, PA-C  torsemide (DEMADEX) 20 MG tablet Take 40 mg by mouth every other day.    [provider]  traMADol-acetaminophen (ULTRACET) 37.5-325 MG tablet Take 2 tablets by mouth 2 (two) times daily.    [provider]    Allergies    Meloxicam and Vancomycin  Review of Systems   Review of Systems  Constitutional: Negative for chills and fever.  HENT: Positive for nosebleeds. Negative for sore throat.   Respiratory: Negative for cough and shortness of breath.   Cardiovascular: Negative for chest pain and palpitations.  Gastrointestinal: Positive for nausea. Negative for vomiting.  Musculoskeletal: Negative for arthralgias and back pain.  Skin: Negative for color change and rash.   Neurological: Negative for syncope, light-headedness and headaches.  All other systems reviewed and are negative.   Physical Exam Updated Vital Signs BP (!) 150/77   Pulse 70   Temp 97.7 F (36.5 C) (Oral)   Resp 18   Ht 5\' 11"  (1.803 m)   Wt 89.8 kg   SpO2 99%   BMI 27.62 kg/m   Physical Exam Constitutional:      General: He is not in acute distress. HENT:     Head: Normocephalic and atraumatic.     Nose:     Comments: No visible anterior nares lesion Blood clotting noted near right turbinate No active bleeding Eyes:     Conjunctiva/sclera: Conjunctivae normal.     Pupils: Pupils are equal, round, and reactive to light.  Cardiovascular:     Rate and Rhythm: Normal rate. Rhythm irregular.  Pulmonary:     Effort: Pulmonary effort is normal. No respiratory distress.  Abdominal:     General: There is no distension.     Tenderness: There is no abdominal tenderness.  Skin:    General: Skin is warm and dry.  Neurological:     General: No focal deficit present.     Mental Status: He is alert. Mental status is at baseline.  Psychiatric:        Mood and Affect: Mood normal.        Behavior: Behavior normal.     ED Results / Procedures / Treatments   Labs (all labs ordered are listed, but only abnormal results are displayed) Labs Reviewed - No data to display  EKG None  Radiology No results found.  Procedures Procedures   Medications Ordered in ED Medications  oxymetazoline (AFRIN) 0.05 % nasal spray 2 spray (2 sprays Each Nare Given 06/28/20 0753)    ED Course  I have reviewed the triage vital signs and the nursing notes.  Pertinent labs & imaging results that were available during my care of the patient were reviewed by me and considered in my medical decision making (see chart for details).  70 yo male on eliquis here with intermittent spontaneous nose bleeds for 3-4 months He does not pick at his nose.  No hx of trauma.  No active bleeding now.  He  is HD stable.  Doubt significant blood loss.  Likely this is an anterior bleed related to dry weather and use of supplemental oxygen at night.  We discussed buying a humidified for his oxygen (he'll have to talk to his PCP about this, I'm not sure what options are available), and using vaseline twice a day smeared on the inside of his nose.  We'll provide afrin as well.  I discussed how to blow out nose, use afrin, and hold firm pressure for 10 minutes.  I can provide the information for ENT as this is recurring.  Return precautions given.    Final Clinical Impression(s) / ED Diagnoses Final diagnoses:  Epistaxis    Rx / DC Orders ED Discharge Orders    None       Corwin Kuiken, Carola Rhine, MD 06/28/20 [phone]

## 2020-06-28 NOTE — ED Triage Notes (Signed)
Reports recurrent epistaxis. On plavix. Hx HTN. On home O2 at night only. No clots. No active bleeding at this time.

## 2020-06-28 NOTE — Discharge Instructions (Signed)
Your nosebleed may be caused by the dry weather and oxygen use at home.  Talk to your doctor about options for buying a humidifier for your oxygen.    You should smear vaseline gently on the inside of both nose holes twice a day with your pinky.  Use a generous amount.  If you start bleeding again, follow these instructions at home: When you have a nosebleed: Sit down and tilt your head slightly forward. Gently blow out both nose holes into a paper towel to clear away the blood clots. Spray 2 squirts of afrin into each nose with your head tilted back. Then bend back forward and pinch your nostrils firmly under the bony part of your nose.  You should be pinching the soft part of your nose hard. After 10 minutes, let go of your nose and see if the bleeding starts again. Do not release pressure before that time. If there is still bleeding, repeat the pinching and holding for 10 minutes or until the bleeding stops. Do not place tissues or gauze in the nose to stop the bleeding. Avoid lying down and avoid tilting your head backward. That may make blood collect in the throat and cause gagging or coughing.     After a nosebleed: Try not to blow your nose or sniffle for several hours. Try not to strain, lift, or bend at the waist for several days. Aspirin and blood-thinning medicines make bleeding more likely. If you take these medicines: Ask your doctor if you should stop taking them or if you should change how much you take. Do not stop taking the medicine unless your doctor tells you to. If your nosebleed was caused by dryness, use over-the-counter saline nasal spray or gel and humidifier as told by your doctor. This will keep the inside of your nose moist and allow it to heal. If you need to use one of these products: Choose one that is water-soluble. Use only as much as you need and use it only as often as needed. Do not lie down right away after you use it. If you get nosebleeds often, talk  with your doctor about treatments. These may include: Nasal cautery. A chemical swab or electrical device is used to lightly burn tiny blood vessels inside the nose. This helps stop or prevent nosebleeds. Nasal packing. A gauze or other material is placed in the nose to keep constant pressure on the bleeding area.  Contact a doctor if: You have a fever. You get nosebleeds often. You are getting nosebleeds more often than usual. You bruise very easily. You have something stuck in your nose. You have bleeding in your mouth. You vomit or cough up brown material. You get a nosebleed after you start a new medicine.  Get help right away if: You have a nosebleed after you fall or hurt your head. Your nosebleed does not go away after 20 minutes. You feel dizzy or weak. You have unusual bleeding from other parts of your body. You have unusual bruising on other parts of your body. You get sweaty. You vomit blood.  Summary Nosebleeds are common. They are usually not a sign of a serious medical problem. When you have a nosebleed, sit down and tilt your head a little forward. Pinch your nose with a clean tissue for 5 minutes. Use saline spray or saline gel and a humidifier as told by your doctor. Get help right away if your nosebleed does not go away after 20 minutes.

## 2020-07-03 ENCOUNTER — Ambulatory Visit: Payer: Medicare Other | Admitting: Primary Care

## 2020-07-03 ENCOUNTER — Telehealth: Payer: Self-pay

## 2020-07-03 NOTE — Telephone Encounter (Signed)
[facility]lin alert- Device reached ERI 06/30/20.    LOV 06/12/20, MD noted ERI/ Gen changeout discussed.  Pt is s/p Ablation 04/19/20.  He is currently scheduled for DCCV on 07/10/20, presenting rhythm is AF/ VP.  Pt is device dependant.    Forwarding to MD/RN to schedule Gen change procedure.

## 2020-07-04 DIAGNOSIS — R040 Epistaxis: Secondary | ICD-10-CM | POA: Diagnosis not present

## 2020-07-06 ENCOUNTER — Other Ambulatory Visit (HOSPITAL_COMMUNITY): Payer: Self-pay | Admitting: *Deleted

## 2020-07-06 ENCOUNTER — Encounter (HOSPITAL_COMMUNITY): Payer: Self-pay

## 2020-07-06 ENCOUNTER — Telehealth (HOSPITAL_COMMUNITY): Payer: Self-pay | Admitting: Pharmacy Technician

## 2020-07-06 MED ORDER — APIXABAN 5 MG PO TABS: 5.0000 mg | ORAL_TABLET | Freq: Two times a day (BID) | ORAL | 3 refills | Status: DC

## 2020-07-06 NOTE — Telephone Encounter (Signed)
Plans for [facility] 07/11/19 noted.  Please schedule generator change 4 weeks thereafter as we cannot hold [place] for 4 weeks.

## 2020-07-06 NOTE — Telephone Encounter (Signed)
Received a message that the patient's wife wanted a return call. Called and spoke with her, the patient is waiting on a shipment of Jardiance and will run out before it arrives to his home. Asked Dyann Ruddle, (RN) to get the patient a bottle of samples.  Went over BMS OOP guidelines for Medicare patients. Will start an application once 3% OOP has been met.  Charlann Boxer, CPhT

## 2020-07-06 NOTE — Progress Notes (Signed)
Medication Samples have been provided to the patient.  Drug name: Jardiance       Strength: 10 mg        Qty: 2  LOT: [id number]  Exp.Date: 12/2021  Dosing instructions: Take 1 tablet daily  The patient has been instructed regarding the correct time, dose, and frequency of taking this medication, including desired effects and most common side effects.   [name] [name] 1:43 PM 07/06/2020

## 2020-07-08 ENCOUNTER — Other Ambulatory Visit (HOSPITAL_COMMUNITY)
Admission: RE | Admit: 2020-07-08 | Discharge: 2020-07-08 | Disposition: A | Discharge status: Home / Self Care | Payer: Medicare Other | Source: Ambulatory Visit | Attending: Cardiology | Admitting: Cardiology

## 2020-07-08 DIAGNOSIS — Z01812 Encounter for preprocedural laboratory examination: Secondary | ICD-10-CM | POA: Diagnosis not present

## 2020-07-08 DIAGNOSIS — Z20822 Contact with and (suspected) exposure to covid-19: Secondary | ICD-10-CM | POA: Insufficient documentation

## 2020-07-08 LAB — SARS CORONAVIRUS 2 (TAT 6-24 HRS): SARS Coronavirus 2: NEGATIVE

## 2020-07-10 ENCOUNTER — Ambulatory Visit (HOSPITAL_COMMUNITY)
Admission: RE | Admit: 2020-07-10 | Discharge: 2020-07-10 | Disposition: A | Discharge status: Home / Self Care | Payer: Medicare Other | Attending: Cardiology | Admitting: Cardiology

## 2020-07-10 ENCOUNTER — Ambulatory Visit (HOSPITAL_COMMUNITY): Payer: Medicare Other | Admitting: Anesthesiology

## 2020-07-10 ENCOUNTER — Encounter (HOSPITAL_COMMUNITY): Payer: Self-pay | Admitting: Cardiology

## 2020-07-10 ENCOUNTER — Encounter (HOSPITAL_COMMUNITY)
Admission: RE | Disposition: A | Discharge status: Home / Self Care | Payer: Self-pay | Source: Home / Self Care | Attending: Cardiology

## 2020-07-10 ENCOUNTER — Other Ambulatory Visit: Payer: Self-pay

## 2020-07-10 ENCOUNTER — Encounter: Payer: Self-pay | Admitting: *Deleted

## 2020-07-10 DIAGNOSIS — Z79891 Long term (current) use of opiate analgesic: Secondary | ICD-10-CM | POA: Insufficient documentation

## 2020-07-10 DIAGNOSIS — I4891 Unspecified atrial fibrillation: Secondary | ICD-10-CM | POA: Diagnosis present

## 2020-07-10 DIAGNOSIS — E785 Hyperlipidemia, unspecified: Secondary | ICD-10-CM | POA: Diagnosis not present

## 2020-07-10 DIAGNOSIS — I4819 Other persistent atrial fibrillation: Secondary | ICD-10-CM | POA: Diagnosis not present

## 2020-07-10 DIAGNOSIS — I480 Paroxysmal atrial fibrillation: Secondary | ICD-10-CM | POA: Diagnosis not present

## 2020-07-10 DIAGNOSIS — Z7952 Long term (current) use of systemic steroids: Secondary | ICD-10-CM | POA: Insufficient documentation

## 2020-07-10 DIAGNOSIS — J441 Chronic obstructive pulmonary disease with (acute) exacerbation: Secondary | ICD-10-CM | POA: Diagnosis not present

## 2020-07-10 DIAGNOSIS — Z7984 Long term (current) use of oral hypoglycemic drugs: Secondary | ICD-10-CM | POA: Diagnosis not present

## 2020-07-10 DIAGNOSIS — Z7901 Long term (current) use of anticoagulants: Secondary | ICD-10-CM | POA: Insufficient documentation

## 2020-07-10 DIAGNOSIS — I484 Atypical atrial flutter: Secondary | ICD-10-CM | POA: Diagnosis not present

## 2020-07-10 DIAGNOSIS — Z7951 Long term (current) use of inhaled steroids: Secondary | ICD-10-CM | POA: Insufficient documentation

## 2020-07-10 DIAGNOSIS — Z79890 Hormone replacement therapy (postmenopausal): Secondary | ICD-10-CM | POA: Diagnosis not present

## 2020-07-10 DIAGNOSIS — Z79899 Other long term (current) drug therapy: Secondary | ICD-10-CM | POA: Diagnosis not present

## 2020-07-10 HISTORY — PX: CARDIOVERSION: SHX1299

## 2020-07-10 LAB — POCT I-STAT, CHEM 8
BUN: 30 mg/dL — ABNORMAL HIGH (ref 8–23)
Calcium, Ion: 1.22 mmol/L (ref 1.15–1.40)
Chloride: 99 mmol/L (ref 98–111)
Creatinine, Ser: 1.6 mg/dL — ABNORMAL HIGH (ref 0.61–1.24)
Glucose, Bld: 122 mg/dL — ABNORMAL HIGH (ref 70–99)
HCT: 48 % (ref 39.0–52.0)
Hemoglobin: 16.3 g/dL (ref 13.0–17.0)
Potassium: 4.2 mmol/L (ref 3.5–5.1)
Sodium: 137 mmol/L (ref 135–145)
TCO2: 31 mmol/L (ref 22–32)

## 2020-07-10 SURGERY — CARDIOVERSION
Anesthesia: General

## 2020-07-10 MED ORDER — ETOMIDATE 2 MG/ML IV SOLN
INTRAVENOUS | Status: DC | PRN
Start: 2020-07-10 — End: 2020-07-10
  Administered 2020-07-10: 8 mg via INTRAVENOUS

## 2020-07-10 MED ORDER — PROPOFOL 10 MG/ML IV BOLUS
INTRAVENOUS | Status: DC | PRN
  Administered 2020-07-10: 50 mg via INTRAVENOUS

## 2020-07-10 MED ORDER — PROMETHAZINE HCL 25 MG/ML IJ SOLN: 6.2500 mg | INTRAMUSCULAR | Status: DC | PRN

## 2020-07-10 MED ORDER — SODIUM CHLORIDE 0.9 % IV SOLN: Freq: Once | INTRAVENOUS | Status: DC

## 2020-07-10 MED ORDER — LIDOCAINE HCL (CARDIAC) PF 100 MG/5ML IV SOSY
PREFILLED_SYRINGE | INTRAVENOUS | Status: DC | PRN
  Administered 2020-07-10: 100 mg via INTRAVENOUS

## 2020-07-10 NOTE — Transfer of Care (Signed)
Immediate Anesthesia Transfer of Care Note  Patient: [name]  Procedure(s) Performed: CARDIOVERSION (N/A )  Patient Location: Endoscopy Unit  Anesthesia Type:General  Level of Consciousness: awake, alert  and sedated  Airway & Oxygen Therapy: Patient connected to nasal cannula oxygen  Post-op Assessment: Post -op Vital signs reviewed and stable  Post vital signs: stable  Last Vitals:  Vitals Value Taken Time  BP    Temp    Pulse    Resp    SpO2      Last Pain:  Vitals:   07/10/20 0910  TempSrc: Temporal  PainSc: 0-No pain         Complications: No complications documented.

## 2020-07-10 NOTE — Anesthesia Procedure Notes (Signed)
Procedure Name: General with mask airway Date/Time: 07/10/2020 10:06 AM Performed by: Lavell Luster, CRNA Pre-anesthesia Checklist: Patient identified, Emergency Drugs available, Suction available, Patient being monitored and Timeout performed Patient Re-evaluated:Patient Re-evaluated prior to induction Oxygen Delivery Method: Ambu bag Induction Type: IV induction Placement Confirmation: breath sounds checked- equal and bilateral,  ETT inserted through vocal cords under direct vision and positive ETCO2 Dental Injury: Teeth and Oropharynx as per pre-operative assessment

## 2020-07-10 NOTE — Anesthesia Postprocedure Evaluation (Signed)
Anesthesia Post Note  Patient: [name]  Procedure(s) Performed: CARDIOVERSION (N/A )     Patient location during evaluation: PACU Anesthesia Type: General Level of consciousness: awake and alert and oriented Pain management: pain level controlled Vital Signs Assessment: post-procedure vital signs reviewed and stable Respiratory status: spontaneous breathing, nonlabored ventilation and respiratory function stable Cardiovascular status: blood pressure returned to baseline Postop Assessment: no apparent nausea or vomiting Anesthetic complications: no   No complications documented.  Last Vitals:  Vitals:   07/10/20 1009 07/10/20 1019  BP: 130/62 (!) 125/55  Pulse: 62 (!) 56  Resp: (!) 26 (!) 22  Temp: 36.8 C   SpO2: 99% 97%    Last Pain:  Vitals:   07/10/20 1019  TempSrc:   PainSc: 0-No pain                 Brennan Bailey

## 2020-07-10 NOTE — Anesthesia Preprocedure Evaluation (Addendum)
Anesthesia Evaluation  Patient identified by MRN, date of birth, ID band Patient awake    Reviewed: Allergy & Precautions, NPO status , Patient's Chart, lab work & pertinent test results  History of Anesthesia Complications Negative for: history of anesthetic complications  Airway Mallampati: II  TM Distance: >3 FB Neck ROM: Full    Dental  (+) Edentulous Upper, Missing,    Pulmonary asthma , sleep apnea , COPD,  COPD inhaler, former smoker,    Pulmonary exam normal        Cardiovascular hypertension, Pt. 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  Reproductive/Obstetrics negative OB ROS                            Anesthesia Physical Anesthesia Plan  ASA: IV  Anesthesia Plan: General   Post-op Pain Management:    Induction: Intravenous  PONV Risk Score and Plan: Treatment may vary due to age or medical condition and Propofol infusion  Airway Management Planned: Mask  Additional Equipment: None  Intra-op Plan:   Post-operative Plan:   Informed Consent: I have reviewed the patients History and Physical, chart, labs and discussed the procedure including the risks, benefits and alternatives for the proposed anesthesia  with the patient or authorized representative who has indicated his/her understanding and acceptance.       Plan Discussed with: CRNA  Anesthesia Plan Comments:        Anesthesia Quick Evaluation

## 2020-07-10 NOTE — Interval H&P Note (Signed)
History and Physical Interval Note:  07/10/2020 9:10 AM  [name]  has presented today for surgery, with the diagnosis of A-FIB.  The various methods of treatment have been discussed with the patient and family. After consideration of risks, benefits and other options for treatment, the patient has consented to  Procedure(s): CARDIOVERSION (N/A) as a surgical intervention.  The patient's history has been reviewed, patient examined, no change in status, stable for surgery.  I have reviewed the patient's chart and labs.  Questions were answered to the patient's satisfaction.     [employer]

## 2020-07-10 NOTE — Patient Instructions (Addendum)
Medication Instructions:  Stop your Tikosyn  Your physician recommends that you continue on your current medications as directed. Please refer to the Current Medication list given to you today.  Labwork: CBC, BMP  Testing/Procedures: Your physician has recommended that you have a pacemaker battery change out. A pacemaker is a small device that is placed under the skin of your chest or abdomen to help control abnormal heart rhythms. This device uses electrical pulses to prompt the heart to beat at a normal rate. Pacemakers are used to treat heart rhythms that are too slow. Wire (leads) are attached to the pacemaker that goes into the chambers of you heart. This is done in the hospital and usually requires and overnight stay. Please see the instruction sheet given to you today for more information.  Any Other Special Instructions Will Be Listed Below (If Applicable).  If you need a refill on your cardiac medications before your next appointment, please call your pharmacy.    Pacemaker Battery Change  A pacemaker battery usually lasts 5-15 years (6-7 years on average). A few times a year, you may be asked to visit your health care provider to have a full evaluation of your pacemaker. When the battery is low, your pacemaker will be completely replaced. Most often, this procedure is simpler than the first surgery because the wires (leads) that connect the pacemaker to the heart are already in place. There are many things that affect how long a pacemaker battery will last, including:  The age of the pacemaker.  The number of leads you have(1, 2, or 3).  The use or workload of the pacemaker. If the pacemaker is helping the heart more often, the battery will not last as long.  Power (voltage) settings. Tell a health care provider about:  Any allergies you have.  All medicines you are taking, including vitamins, herbs, eye drops, creams, and over-the-counter medicines.  Any problems you or  family members have had with anesthetic medicines.  Any blood disorders you have.  Any surgeries you have had, especially any surgeries you have had since your last pacemaker was placed.  Any medical conditions you have.  Whether you are pregnant or may be pregnant. What are the risks? Generally, this is a safe procedure. However, problems may occur, including:  Bleeding.  Infection.  Nerve damage.  Allergic reaction to medicines.  Damage to the leads that go to the heart. What happens before the procedure? Staying hydrated Follow instructions from your health care provider about hydration, which may include:  Up to 2 hours before the procedure - you may continue to drink clear liquids, such as water, clear fruit juice, black coffee, and plain tea. Eating and drinking restrictions Follow instructions from your health care provider about eating and drinking restrictions, which may include:  8 hours before the procedure - stop eating heavy meals or foods, such as meat, fried foods, or fatty foods.  6 hours before the procedure - stop eating light meals or foods, such as toast or cereal.  6 hours before the procedure - stop drinking milk or drinks that contain milk.  2 hours before the procedure - stop drinking clear liquids. Medicines Ask your health care provider about:  Changing or stopping your regular medicines. This is especially important if you are taking diabetes medicines or blood thinners.  Taking medicines such as aspirin and ibuprofen. These medicines can thin your blood. Do not take these medicines unless your health care provider tells you to take  them.  Taking over-the-counter medicines, vitamins, herbs, and supplements. General instructions  Ask your health care provider what steps will be taken to help prevent infection. These may include: ? Removing hair at the surgery site. ? Washing skin with a germ-killing soap. ? Receiving antibiotic  medicine.  Plan to have someone take you home from the hospital or clinic.  If you will be going home right after the procedure, plan to have someone with you for 24 hours. What happens during the procedure?  An IV will be inserted into one of your veins.  You will be given one or more of the following: ? A medicine to help you relax (sedative). ? A medicine to numb the area where the pacemaker is located (local anesthetic).  Your health care provider will make an incision to reopen the pocket holding the pacemaker.  The old pacemaker will be disconnected from the leads.  The leads will be tested.  If needed, the leads will be replaced. If the leads are functioning properly, the new pacemaker will be connected to the existing leads.  A heart monitor and a pacemaker programmer will be used to make sure that the newly implanted pacemaker is working properly.  The incision site will be closed with stitches (sutures), adhesive strips, or skin glue.  A bandage (dressing) will be placed over the pacemaker site. The procedure may vary among health care providers and hospitals. What happens after the procedure?  Your blood pressure, heart rate, breathing rate, and blood oxygen level will be monitored until you leave the hospital or clinic.  You may be given antibiotics.  Your health care provider will tell you when your pacemaker will need to be tested again, or when to return to the office for removal of the dressing and sutures.  If you were given a sedative during the procedure, it can affect you for several hours. Do not drive or operate machinery until your health care provider says that it is safe.  You will be given a pacemaker identification card. This card lists the implant date, device model, and manufacturer of your pacemaker. Summary  A pacemaker battery usually lasts 5-15 years (6-7 years on average).  When the battery is low, your pacemaker will need to be  replaced.  Most often, this procedure is simpler than the first surgery because the wires (leads) that connect the pacemaker to the heart are already in place.  Risks of this procedure include bleeding, infection, and allergic reactions to medicines. This information is not intended to replace advice given to you by your health care provider. Make sure you discuss any questions you have with your health care provider. Document Revised: 03/18/2019 Document Reviewed: 03/18/2019 Elsevier Patient Education  2021 [employer].

## 2020-07-10 NOTE — Discharge Instructions (Signed)

## 2020-07-10 NOTE — Telephone Encounter (Signed)
Set up Pacemaker generator change and pre covid screening. Will go over and give instruction at March 23 office visit.  Aware and agreeable with plan.

## 2020-07-10 NOTE — CV Procedure (Signed)
    Electrical Cardioversion Procedure Note LONNY EISEN [id number] [birth year]  Procedure: Electrical Cardioversion Indications:  Atrial Fibrillation  Time Out: Verified patient identification, verified procedure,medications/allergies/relevent history reviewed, required imaging and test results available.  Performed  Procedure Details  The patient was NPO after midnight. Anesthesia was administered at the beside  with propofol.  Cardioversion was performed with synchronized biphasic defibrillation via AP pads with 200 joules.  1 attempt(s) were performed.  The patient converted to normal sinus rhythm. The patient tolerated the procedure well   IMPRESSION:  Successful cardioversion of atrial fibrillation. Pacemaker ERI.   Candee Furbish 07/10/2020, 10:33 AM

## 2020-07-10 NOTE — Telephone Encounter (Signed)
[name] from [facility] reached out patient (currently is having procedure done spoke to wife), advised device reached RRT 06/30/20 and Dr. Rayann Heman sent message to [name] to schedule the patent for a gen change 4 weeks after cardioversion. Advised she will reach out to him to schedule. Advised I will forward to [place]. Advised to please call with any further questions or concerns.

## 2020-07-11 ENCOUNTER — Encounter (HOSPITAL_COMMUNITY): Payer: Self-pay | Admitting: Cardiology

## 2020-07-12 ENCOUNTER — Other Ambulatory Visit: Payer: Self-pay

## 2020-07-12 ENCOUNTER — Ambulatory Visit (HOSPITAL_BASED_OUTPATIENT_CLINIC_OR_DEPARTMENT_OTHER)
Admission: RE | Admit: 2020-07-12 | Discharge: 2020-07-12 | Disposition: A | Discharge status: Home / Self Care | Payer: Medicare Other | Source: Ambulatory Visit | Attending: Internal Medicine | Admitting: Internal Medicine

## 2020-07-12 ENCOUNTER — Encounter: Payer: Medicare Other | Admitting: Internal Medicine

## 2020-07-12 ENCOUNTER — Encounter (HOSPITAL_COMMUNITY): Payer: Self-pay | Admitting: Internal Medicine

## 2020-07-12 ENCOUNTER — Ambulatory Visit (HOSPITAL_COMMUNITY)
Admission: RE | Admit: 2020-07-12 | Discharge: 2020-07-12 | Disposition: A | Discharge status: Home / Self Care | Payer: Medicare Other | Source: Ambulatory Visit | Attending: Internal Medicine | Admitting: Internal Medicine

## 2020-07-12 VITALS — BP 140/70 | HR 65 | Wt 190.0 lb

## 2020-07-12 DIAGNOSIS — I130 Hypertensive heart and chronic kidney disease with heart failure and stage 1 through stage 4 chronic kidney disease, or unspecified chronic kidney disease: Secondary | ICD-10-CM | POA: Diagnosis not present

## 2020-07-12 DIAGNOSIS — D49512 Neoplasm of unspecified behavior of left kidney: Secondary | ICD-10-CM | POA: Diagnosis not present

## 2020-07-12 DIAGNOSIS — E1122 Type 2 diabetes mellitus with diabetic chronic kidney disease: Secondary | ICD-10-CM | POA: Insufficient documentation

## 2020-07-12 DIAGNOSIS — Z7901 Long term (current) use of anticoagulants: Secondary | ICD-10-CM | POA: Diagnosis not present

## 2020-07-12 DIAGNOSIS — J449 Chronic obstructive pulmonary disease, unspecified: Secondary | ICD-10-CM | POA: Diagnosis not present

## 2020-07-12 DIAGNOSIS — I38 Endocarditis, valve unspecified: Secondary | ICD-10-CM | POA: Insufficient documentation

## 2020-07-12 DIAGNOSIS — I272 Pulmonary hypertension, unspecified: Secondary | ICD-10-CM | POA: Diagnosis not present

## 2020-07-12 DIAGNOSIS — Z79899 Other long term (current) drug therapy: Secondary | ICD-10-CM | POA: Insufficient documentation

## 2020-07-12 DIAGNOSIS — Z7984 Long term (current) use of oral hypoglycemic drugs: Secondary | ICD-10-CM | POA: Diagnosis not present

## 2020-07-12 DIAGNOSIS — Z8249 Family history of ischemic heart disease and other diseases of the circulatory system: Secondary | ICD-10-CM | POA: Diagnosis not present

## 2020-07-12 DIAGNOSIS — Z7952 Long term (current) use of systemic steroids: Secondary | ICD-10-CM | POA: Diagnosis not present

## 2020-07-12 DIAGNOSIS — I2510 Atherosclerotic heart disease of native coronary artery without angina pectoris: Secondary | ICD-10-CM | POA: Diagnosis not present

## 2020-07-12 DIAGNOSIS — Z950 Presence of cardiac pacemaker: Secondary | ICD-10-CM | POA: Diagnosis not present

## 2020-07-12 DIAGNOSIS — I4819 Other persistent atrial fibrillation: Secondary | ICD-10-CM | POA: Insufficient documentation

## 2020-07-12 DIAGNOSIS — Z79890 Hormone replacement therapy (postmenopausal): Secondary | ICD-10-CM | POA: Diagnosis not present

## 2020-07-12 DIAGNOSIS — Z952 Presence of prosthetic heart valve: Secondary | ICD-10-CM | POA: Insufficient documentation

## 2020-07-12 DIAGNOSIS — J9611 Chronic respiratory failure with hypoxia: Secondary | ICD-10-CM | POA: Diagnosis not present

## 2020-07-12 DIAGNOSIS — I5032 Chronic diastolic (congestive) heart failure: Secondary | ICD-10-CM

## 2020-07-12 DIAGNOSIS — N1832 Chronic kidney disease, stage 3b: Secondary | ICD-10-CM | POA: Insufficient documentation

## 2020-07-12 DIAGNOSIS — Z9981 Dependence on supplemental oxygen: Secondary | ICD-10-CM | POA: Insufficient documentation

## 2020-07-12 DIAGNOSIS — Z87891 Personal history of nicotine dependence: Secondary | ICD-10-CM | POA: Insufficient documentation

## 2020-07-12 DIAGNOSIS — Z888 Allergy status to other drugs, medicaments and biological substances status: Secondary | ICD-10-CM | POA: Insufficient documentation

## 2020-07-12 DIAGNOSIS — Z881 Allergy status to other antibiotic agents status: Secondary | ICD-10-CM | POA: Insufficient documentation

## 2020-07-12 DIAGNOSIS — Z953 Presence of xenogenic heart valve: Secondary | ICD-10-CM | POA: Insufficient documentation

## 2020-07-12 DIAGNOSIS — I081 Rheumatic disorders of both mitral and tricuspid valves: Secondary | ICD-10-CM | POA: Insufficient documentation

## 2020-07-12 DIAGNOSIS — E785 Hyperlipidemia, unspecified: Secondary | ICD-10-CM | POA: Insufficient documentation

## 2020-07-12 DIAGNOSIS — Z7951 Long term (current) use of inhaled steroids: Secondary | ICD-10-CM | POA: Insufficient documentation

## 2020-07-12 DIAGNOSIS — I50812 Chronic right heart failure: Secondary | ICD-10-CM | POA: Insufficient documentation

## 2020-07-12 LAB — ECHOCARDIOGRAM COMPLETE
AR max vel: 1.7 cm2
AV Area VTI: 1.89 cm2
AV Area mean vel: 1.8 cm2
AV Mean grad: 7 mmHg
AV Peak grad: 12.7 mmHg
Ao pk vel: 1.78 m/s
Area-P 1/2: 4.89 cm2
Calc EF: 51.1 %
MV M vel: 5.11 m/s
MV Peak grad: 104.4 mmHg
S' Lateral: 3.7 cm
Single Plane A2C EF: 51.4 %
Single Plane A4C EF: 51.4 %

## 2020-07-12 NOTE — Progress Notes (Signed)
Medication Samples have been provided to the patient.  Drug name: Eliquis       Strength: 5 mg        Qty: 4  LOT: [id number]  Exp.Date: 04/23   Dosing instructions: Take 1 tablet 2 x a day  The patient has been instructed regarding the correct time, dose, and frequency of taking this medication, including desired effects and most common side effects.   [name] 11:42 AM 07/12/2020

## 2020-07-12 NOTE — Progress Notes (Signed)
  Echocardiogram 2D Echocardiogram has been performed.  Michiel Cowboy 07/12/2020, 10:38 AM

## 2020-07-12 NOTE — Patient Instructions (Signed)
Please call our office in August to schedule your follow up appointment for September  If you have any questions or concerns before your next appointment please send [place] a message through [place] or call our office at [phone].    TO LEAVE A MESSAGE FOR THE NURSE SELECT OPTION 2, PLEASE LEAVE A MESSAGE INCLUDING: . YOUR NAME . DATE OF BIRTH . CALL BACK NUMBER . REASON FOR CALL**this is important as we prioritize the call backs  [place] AS LONG AS YOU CALL BEFORE 4:00 PM  At the [facility], you and your health needs are our priority. As part of our continuing mission to provide you with exceptional heart care, we have created designated Provider Care Teams. These Care Teams include your primary Cardiologist (physician) and Advanced Practice Providers (APPs- Physician Assistants and Nurse Practitioners) who all work together to provide you with the care you need, when you need it.   You may see any of the following providers on your designated Care Team at your next follow up: [name] Dr Glori Bickers . Dr Loralie Champagne . Dr Vickki Muff . Darrick Grinder, NP . Lyda Jester, [place] . Audry Riles, PharmD   Please be sure to bring in all your medications bottles to every appointment.

## 2020-07-12 NOTE — Progress Notes (Signed)
Advanced Heart Failure Clinic Note   Primary Care: Velna Hatchet, MD Primary Cardiologist: Dr. Acie Fredrickson Primary HF: Dr. Haroldine Laws   HPI:  [name] is a 70 y.o. 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   Admitte 02/02/16 with severe DOE with any exercise or walking 10-20 feet. Pt admitted and planned for [facility] 02/05/16. Diuresed with IV lasix with rapid improvement of symptoms. Overall diuresed 15.2 L and down 14 lbs. Discharge weight was 205 lbs. L/RHC 02/05/16 with no significant CAD. RHC showed significant difference between PCWP (50) and LVEDP (30) suggestive of MS but no MS on Echo. Weight at d/c was 205 pounds.  Echo 02/02/16 LVEF 60-65%, Grade 3 DD. Mild AI, Mild MR, RV mildly dilated and severely reduced. RA mildly dilated. PA peak pressure 74 mm Hg. Concerns for restrictive cardiomyopathy.  Admitted 12/17 for Tikosyn load. RHC completed during that admission as below.   Admitted 6/21 with recurrent respiratory failure and lactic acidosis felt to be mostly COPD flare. Had recurrent AF and underwent TEE-DC-CV on 10/19/19. Tikosyn continued.  TEE results LEFT VENTRICLE: EF = 60% No WMA RIGHT VENTRICLE: Moderately HK LEFT ATRIUM: Moderately to severely dilated. LA diameter 5.1 cm LEFT ATRIAL APPENDAGE: Surgically amputated. No clot in residual small pouch AORTIC VALVE:  Bioprosthetic valve. Functions normally. No AI/AS MITRAL VALVE:    Slight restriction of posterior leaflet 3+ posterior MR TRICUSPID VALVE: Normal severe TR. RVSP ~ 70mmHG INTERATRIAL SEPTUM: No PFO/ASD  Admitted 9/21 with SOB/chills, leukocytosis (WBC 15.9k), concerning for COVID infection versus COPD exacerbation. Diuresed with IV lasix on presentation, CXR negative for pulmonary edema or infiltrates. No antibiotics administered, continuous 2L of oxygen, prednisone 5 mg daily &  inhalers daily for mild COPD exacerbation. Abdominal pain improved; CT abdomen unremarkable.   02/25/20 DCCV with Dr. Harrington Challenger maintained NSR only for 5 hrs  Underwent repeat AF ablation 04/19/20. Returned to AF 04/21/20.   Repeat DC-CV 05/25/20 -> AF  Repeat DC-CV 07/11/19. PPM at [facility]  Here with his wife. Says he hasn't felt well since DC-CV. Weak and nauseated. Chronic SOB. No CP, orthopnea or PND. No bleeding.   Echo today 07/12/20: EF 55-60% RV ok. Mild MR/TR. Bioprosthetic AVR ok. Restrictive physiology. Personally reviewed   Studies: ABI 06/24/18 Normal  PFTs 4/17  FEV1  1.6 (47%) FVC 2.25 (49%) DLCO 54%  L/RHC 02/05/16 - Mid RCA lesion, 10 %stenosed. - Ost Cx to Prox Cx lesion, 20 %stenosed. - LV end diastolic pressure is moderately elevated. - Hemodynamic findings consistent with severe pulmonary hypertension. - No evidence of discordance on the LV/RV tracing. No evidence of pericardial constriction. - Severly elevated PCWP. - PA sat 51%. CO 6.2 L/min. Cardiac index 2.87. - No significant aortic valve gradient. - There is no aortic valve stenosis.  RHC Procedural Findings: Hemodynamics (mmHg) RA mean 30 RV 87/9 (26) PA 100/56 (72) PCWP 51 LVEDP 31 AO 142/73 (100) Cardiac Output (Fick) 6.21 Cardiac Index (Fick) 2.87 PVR = 3.5 WU   RHC 04/17/16 RA = 13 RV = 66/3/15 PA = 69/23 (42) PCW = 21 (v=38) Fick cardiac output/index = 6.3/2.9 PVR = 3.8 WU Ao sat = 87% (radial artery ABG) PA sat = 43%, 43% SVC sat = 50%   Past Medical History:  Diagnosis Date  . Aortic stenosis, mild   . Arthritis of hand    "  just a little bit in both hands" (03/31/2012)  . Asthma    "little bit" (03/31/2012)  . Atrial fibrillation [facility])    dx '04; DCCV '04, placed on flecainide, failed DCCV 04/2010, flecainide stopped; s/p successful A.fib ablation 01/31/12  . CHF (congestive heart failure) ([place])   . Controlled type 2 diabetes mellitus without complication, without long-term  current use of insulin ([place]) 12/13/2019  . COPD (chronic obstructive pulmonary disease) ([place])   . DJD (degenerative joint disease)   . Fibromyalgia   . GERD (gastroesophageal reflux disease)   . Hyperlipidemia   . Hypertension   . Hypothyroidism    S/P radiation  . Left atrial enlargement    LA size 34mm by echo 11/21/11  . Mitral regurgitation    trivial  . Obstructive sleep apnea    mild by sleep study 2013; pt stated he does not have a machine because "it wasn't bad enough for him to have one"  . Pacemaker 03/31/2012  . Restless leg syndrome   . Second degree AV block   . Splenomegaly   . Visit for monitoring Tikosyn therapy 04/16/2016    Current Outpatient Medications  Medication Sig Dispense Refill  . albuterol (PROVENTIL HFA;VENTOLIN HFA) 108 (90 Base) MCG/ACT inhaler Inhale 2 puffs into the lungs every 6 (six) hours as needed for wheezing or shortness of breath. 1 Inhaler 5  . amLODipine (NORVASC) 10 MG tablet Take 0.5 tablets (5 mg total) by mouth daily. 30 tablet 11  . apixaban (ELIQUIS) 5 MG TABS tablet Take 1 tablet (5 mg total) by mouth 2 (two) times daily. 180 tablet 3  . bisoprolol (ZEBETA) 5 MG tablet Take 5 mg by mouth daily.    . Budeson-Glycopyrrol-Formoterol (BREZTRI AEROSPHERE) 160-9-4.8 MCG/ACT AERO Inhale 2 puffs into the lungs in the morning and at bedtime. 5.9 g 0  . Cholecalciferol (VITAMIN D) 2000 UNITS CAPS Take 8,000 Units by mouth at bedtime.     . dofetilide (TIKOSYN) 250 MCG capsule Take 1 capsule (250 mcg total) by mouth 2 (two) times daily. 180 capsule 1  . empagliflozin (JARDIANCE) 10 MG TABS tablet Take 1 tablet (10 mg total) by mouth daily before breakfast. 30 tablet 6  . fexofenadine (ALLEGRA) 180 MG tablet Take 1 tablet (180 mg total) by mouth daily as needed for allergies or rhinitis.    [name] gabapentin (NEURONTIN) 300 MG capsule Take 600-1,200 mg by mouth See admin instructions. Take 600 mg by mouth in the morning, 600 mg at lunchtime, and take 1200  mg at bedtime  3  . guaifenesin (HUMIBID E) 400 MG TABS tablet Take 800 mg by mouth 2 (two) times daily.    [name] levothyroxine (SYNTHROID, LEVOTHROID) 175 MCG tablet Take 175 mcg by mouth daily before breakfast.    . Multiple Minerals-Vitamins (CAL-MAG-ZINC-D) TABS Take 1 tablet by mouth daily.    . Multiple Vitamin (MULTIVITAMIN WITH MINERALS) TABS tablet Take 1 tablet by mouth daily. 30 tablet 0  . ondansetron (ZOFRAN-ODT) 4 MG disintegrating tablet Take 4 mg by mouth every 8 (eight) hours as needed for nausea or vomiting.    [name] oxyCODONE-acetaminophen (PERCOCET/ROXICET) 5-325 MG tablet Take 1 tablet by mouth every 12 (twelve) hours.    . OXYGEN Inhale 2 L into the lungs continuous.    . pantoprazole (PROTONIX) 40 MG tablet Take 40 mg by mouth daily.    . polyethylene glycol powder (GLYCOLAX/MIRALAX) 17 GM/SCOOP powder Take 17 g by mouth daily as needed for mild constipation.    [name]  Polyvinyl Alcohol-Povidone (REFRESH OP) Place 1 drop into both eyes daily as needed (dry eyes).    . potassium chloride (KLOR-CON) 10 MEQ tablet Take 20 mEq by mouth See admin instructions. Take 20 meq twice every other day (take only on torsemide days)    . predniSONE (DELTASONE) 5 MG tablet Take 1 tablet (5 mg total) by mouth daily with breakfast. 30 tablet 3  . protein supplement shake (PREMIER PROTEIN) LIQD Take 11 oz by mouth daily.    [name] rOPINIRole (REQUIP) 1 MG tablet Take 1 tablet (1 mg total) by mouth in the morning and at bedtime.    . rosuvastatin (CRESTOR) 10 MG tablet Take 10 mg by mouth at bedtime.     [name] spironolactone (ALDACTONE) 25 MG tablet Take 1 tablet (25 mg total) by mouth daily. 90 tablet 3  . torsemide (DEMADEX) 20 MG tablet Take 40 mg by mouth every other day.    . traMADol-acetaminophen (ULTRACET) 37.5-325 MG tablet Take 2 tablets by mouth 2 (two) times daily.     No current facility-administered medications for this encounter.    Allergies  Allergen Reactions  . Meloxicam Rash  . Vancomycin  Other (See Comments)    Red Man's syndrome 09/02/15, resolved with diphenhydramine and slowing of rate      Social History   Socioeconomic History  . Marital status: Married    Spouse name: Not on file  . Number of children: Not on file  . Years of education: Not on file  . Highest education level: Not on file  Occupational History  . Occupation: retired  Tobacco Use  . Smoking status: Former Smoker    Packs/day: 1.00    Years: 25.00    Pack years: 25.00    Types: Cigarettes    Quit date: 07/18/1991    Years since quitting: 29.0  . Smokeless tobacco: Never Used  Vaping Use  . Vaping Use: Never used  Substance and Sexual Activity  . Alcohol use: Not Currently    Comment: occasional  . Drug use: Yes    Types: Marijuana    Comment: rarely use  . Sexual activity: Not Currently  Other Topics Concern  . Not on file  Social History Narrative   Lives in [place] with spouse.  Works as a Air traffic controller.   Social Determinants of Health   Financial Resource Strain: Not on file  Food Insecurity: Not on file  Transportation Needs: Not on file  Physical Activity: Not on file  Stress: Not on file  Social Connections: Not on file  Intimate Partner Violence: Not on file    Family History  Problem Relation Age of Onset  . Heart failure Mother   . Stroke Father     Vitals:   07/12/20 1028  BP: 140/70  Pulse: 65  SpO2: 99%  Weight: 86.2 kg (190 lb)    Wt Readings from Last 3 Encounters:  06/28/20 89.8 kg (198 lb)  06/12/20 91.8 kg (202 lb 6.4 oz)  05/25/20 88.7 kg (195 lb 9.6 oz)   ECG: NSR v-paced 64 Personally reviewed  Device Interrogation: >99% V-pacing with 97% AT/AF burden. Went out of rhythm 11/2 (personally reviewed)  PHYSICAL EXAM: General:  Sitting on exam table. Wearing O2 No resp difficulty HEENT: normal Neck: supple. no JVD. Carotids 2+ bilat; no bruits. No lymphadenopathy or thryomegaly appreciated. Cor: PMI nondisplaced. Regular rate &  rhythm. No rubs, gallops or murmurs. Lungs: clear with severely decreased BS throughout Abdomen: soft, nontender,  nondistended. No hepatosplenomegaly. No bruits or masses. Good bowel sounds. Extremities: no cyanosis, clubbing, rash, edema Neuro: alert & orientedx3, cranial nerves grossly intact. moves all 4 extremities w/o difficulty. Affect pleasant  ASSESSMENT & PLAN:  1. Chronic diastolic CHF with RV failure - Echo 02/02/16 LVEF 60-65%, Grade 3 DD. Mild AI, Mild MR, RV mildly dilated and severely reduced. RA mildly dilated. PA peak pressure 74 mm Hg. Concerns for restrictive cardiomyopathy. - Echo 7/19 EF 60-65% AVR ok  - TEE 6/21 EF 60% RV moderately HK septal flattening RVSP 90s - Echo today 07/12/20: EF 55-60% RV ok. Mild MR/TR. Bioprosthetic AVR ok. Restrictive physiology. Personally reviewed - Stable NYHA III  - Volume status ok  - Continue Jardiance 10 mg daily - Continue torsemide 40 mg qod  - Continue spiro 25 mg daily.   - Reinforced fluid restriction to < 2 L daily, sodium restriction to less than 2000 mg daily, and the importance of daily weights.   - Recent labs ok  2. Persistent Afib s/p AF ablation x 2/Maze s/p pacemaker - TEE-DC-CV on 10/19/19 & DCCV on 02/25/20 - Repeat AF ablation 04/19/20. Returned to AF 04/21/20.  - DC-CV 05/25/20 -> AF - DC-CV 07/11/19.  - Remains in NSR today. Continue Tikosyn per Dr. Rayann Heman - Continue Eliquis 5 mg BID. Recent epistaxis but now improved - Followed by Dr. Rayann Heman - PM at [facility] due for exchange next month  3. AS s/p AVR -S table on echo todayp - aware of need for SBE prophylaxis    4 Pulmonary HTN - suspect pulmonary venous hypertension - RHC in 10/17 with pulmonary venous HTN primarily. No change. - TEE 6/21 EF 60% RV moderately HK septal flattening RVSP 90s in setting of COPD flare and PAF. - Echo today RV looks better. TR insufficient to measure RVSP  5. Mild CAD by cath 01/2016 - Medical management. Continue atorvastatin 40  mg daily - No s/s of ischemia  6. Chronic respiratory failure with hypoxia - Continue with 2L home O2 - Using rescue inhaler daily, encouraged follows up with pulmonary.  - May benefit from [facility]   7. HTN - Blood pressure well controlled. Continue current regimen.  8. DM2 - Contiue Jardiance. Last Hgb A1c 6.6 (6/21)  9. Valvular disease - severe TR mod MR on TEE 6/21 - mild on echo today - continue to follow  10. CKD 3b - right kidney is atrophied - followed by Dr. Moshe Cipro  11. Left kidney tumor - pending f/u with Dr. Thompson Caul, MD  07/12/20

## 2020-07-17 ENCOUNTER — Ambulatory Visit (INDEPENDENT_AMBULATORY_CARE_PROVIDER_SITE_OTHER): Payer: Medicare Other

## 2020-07-17 DIAGNOSIS — I440 Atrioventricular block, first degree: Secondary | ICD-10-CM

## 2020-07-18 ENCOUNTER — Telehealth: Payer: Self-pay

## 2020-07-18 LAB — CUP PACEART REMOTE DEVICE CHECK
Battery Remaining Longevity: 0 mo
Battery Voltage: 2.57 V
Brady Statistic AP VP Percent: 6.8 %
Brady Statistic AP VS Percent: 1 %
Brady Statistic AS VP Percent: 93 %
Brady Statistic AS VS Percent: 1 %
Brady Statistic RA Percent Paced: 1 %
Brady Statistic RV Percent Paced: 99 %
Date Time Interrogation Session: 20220321032217
Implantable Lead Implant Date: 20131203
Implantable Lead Implant Date: 20131203
Implantable Lead Location: 753859
Implantable Lead Location: 753860
Implantable Lead Model: 1948
Implantable Pulse Generator Implant Date: 20131203
Lead Channel Impedance Value: 430 Ohm
Lead Channel Impedance Value: 630 Ohm
Lead Channel Pacing Threshold Amplitude: 0.625 V
Lead Channel Pacing Threshold Amplitude: 0.875 V
Lead Channel Pacing Threshold Pulse Width: 0.5 ms
Lead Channel Pacing Threshold Pulse Width: 0.8 ms
Lead Channel Sensing Intrinsic Amplitude: 12 mV
Lead Channel Sensing Intrinsic Amplitude: 3.6 mV
Lead Channel Setting Pacing Amplitude: 0.875
Lead Channel Setting Pacing Amplitude: 1.875
Lead Channel Setting Pacing Pulse Width: 0.8 ms
Lead Channel Setting Sensing Sensitivity: 4 mV
Pulse Gen Model: 2210
Pulse Gen Serial Number: 7408461

## 2020-07-18 NOTE — Progress Notes (Signed)
'@Patient'  ID: WELCOME FULTS, male    DOB: 08-Feb-1951, 70 y.o.   MRN: [id number]  Chief Complaint  Patient presents with  . Shortness of Breath    Reports breathing improved since last visit.    Referring provider: Velna Hatchet, MD  HPI: 70 year old male, former smoker quit in 1993 (25 pack year hx). 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 Patient of Dr. Lamonte Sakai.  Maintained on Breztri and supplemental oxygen as needed.    Previous [facility] pulmonary encounters: 11/05/2019 Patient admitted to [facility] on October 16, 2019 for COPD exacerbation.  Patient reported increased shortness of breath, wheezing, cough and pleural oral intake for 3 days.  He was discharged from the emergency room with a course of oral steroids and represented to ED the following day with persistent symptoms.  WBC elevated at 17.3.  Chest x-ray showed chronic increased lung markings without evidence of acute cardiopulmonary disease.  CT angio did not show any pulmonary embolism, infiltrate or effusion.  He had marked severity coronary artery calcification and a stable 5 mm right lung nodule.  He was treated with IV steroids and 5-day course of oral doxycycline.  He was discharged on gradual oral prednisone taper.  It was recommended that he stay on 5 mg daily after completing this.  Patient presents today for hospital follow-up.  He has not been seen in our office in over 3 years.  He was recently hospitalized in June for COPD exacerbation. He is currently on Dulera two puffs twice daily and prednisone 71m daily. [facility] could get samples in our office which we no longer receive. States that his breathing is a lot better. He has a chronic productive cough  with beige mucus. He uses albuterol rescue 3-4 times a week when weather is bad. He was seen inpatient on June 20th by Dr. WMelvyn Novaswho recommended he be tried on Breztri d/t group D copd symptoms. Denies fever, chest tightness or wheezing.   03/24/2020 Patient presents today for surgical clearance. During last visit he was started on Breztri two puffs twice daily, started paper work for patient assistance. Wife feels his breathing has been worse the last two weeks d/t weather. [place] around the same time his breathing exacerbated. He has been approved for patient assistance, awaiting shipment of medication. He is currently using Dulera twice a day. He is on chronic 58mprednisone daily. He has been using Albuterol nebulizer once a day. He has a slight productive cough with beige mucus. He is on baseline 2L oxygen, no new requirments. Denies f/c/s, chest tightness, wheezing, N/V/D.    He has a virtual visit with cardiologist on Monday 03/27/20 for consultation cardiac ablation. He has been in Afib since September. He had a cardioversion in October that did not work.   07/19/2020- Interim hx Patient presents today for 4-37-monthllow-up. He has been doing well. Feels his breathing has improved since the fall. He was started on Breztri in July 2021. He is currently on 5 mg of prednisone daily, he would like to wean off steroids if able. He has some breakthough wheezing and chest tightness.  CAT 18. He saw cardiology today, unable to maintain sinus rhythm. Tikosyn was stopped. Needs pacemaker battery change.  Allergies  Allergen Reactions  . Meloxicam Rash  . 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 syndrome 09/02/15, resolved with diphenhydramine and slowing of rate    Immunization History  Administered Date(s) Administered  . Influenza Split 01/27/2013, 02/28/2015  . Influenza, Quadrivalent, Recombinant, Inj, Pf 01/05/2018, 03/04/2019, 04/05/2020  .  Influenza,inj,Quad PF,6+ Mos 01/30/2016  . Influenza-Unspecified 01/30/2012, 01/27/2014  . Pneumococcal Conjugate-13 12/12/2015  . Tdap 12/22/2019    Past Medical History:  Diagnosis Date  . Aortic stenosis, mild   . Arthritis of hand    "just a little bit in both hands" (03/31/2012)  . Asthma    "little bit" (03/31/2012)  . Atrial fibrillation [facility])    dx '04; DCCV '04, placed on flecainide, failed DCCV 04/2010, flecainide stopped; s/p successful A.fib ablation 01/31/12  . CHF (congestive heart failure) ([place])   . Controlled type 2 diabetes mellitus without complication, without long-term current use of insulin ([place]) 12/13/2019  . COPD (chronic obstructive pulmonary disease) ([place])   . DJD (degenerative joint disease)   . Fibromyalgia   . GERD (gastroesophageal reflux disease)   . Hyperlipidemia   . Hypertension   . Hypothyroidism    S/P radiation  . Left atrial enlargement    LA size 36m by echo 11/21/11  . Mitral regurgitation    trivial  . Obstructive sleep apnea    mild by sleep study 2013; pt stated he does not have a machine because "it wasn't bad enough for him to have one"  . Pacemaker 03/31/2012  . Restless leg syndrome   . Second degree AV block   . Splenomegaly   . Visit for monitoring Tikosyn therapy 04/16/2016    Tobacco History: Social History   Tobacco Use  Smoking Status Former Smoker  . Packs/day: 1.00  . Years: 25.00  . Pack years: 25.00  . Types: Cigarettes  . Quit date: 07/18/1991  . Years since quitting: 29.0  Smokeless Tobacco Never Used   Counseling given: Not Answered   Outpatient Medications Prior to Visit  Medication Sig Dispense Refill  . albuterol (PROVENTIL HFA;VENTOLIN HFA) 108 (90 Base) MCG/ACT inhaler Inhale 2 puffs into the lungs every 6 (six) hours as needed for wheezing or shortness of breath. 1 Inhaler 5  . amLODipine (NORVASC) 10 MG tablet Take 0.5 tablets (5 mg total) by mouth daily. 30 tablet 11  . apixaban (ELIQUIS) 5 MG TABS  tablet Take 1 tablet (5 mg total) by mouth 2 (two) times daily. 180 tablet 3  . bisoprolol (ZEBETA) 5 MG tablet Take 5 mg by mouth daily.    . Budeson-Glycopyrrol-Formoterol (BREZTRI AEROSPHERE) 160-9-4.8 MCG/ACT AERO Inhale 2 puffs into the lungs in the morning and at bedtime. 5.9 g 0  . Cholecalciferol (VITAMIN D) 2000 UNITS CAPS Take 8,000 Units by mouth at bedtime.     . empagliflozin (JARDIANCE) 10 MG TABS tablet Take 1 tablet (10 mg total) by mouth daily before breakfast. 30 tablet 6  . fexofenadine (ALLEGRA) 180 MG tablet Take 1 tablet (180 mg total) by mouth daily as needed for allergies or rhinitis.    .Marland Kitchengabapentin (NEURONTIN) 300 MG capsule Take 600-1,200 mg by mouth See admin instructions. Take 600 mg by mouth in the morning, 600 mg at lunchtime, and take 1200 mg at bedtime  3  . levothyroxine (SYNTHROID, LEVOTHROID) 175 MCG tablet Take 175 mcg by mouth daily before breakfast.    . Multiple Minerals-Vitamins (CAL-MAG-ZINC-D) TABS Take 1 tablet by mouth daily.    .[name]  Multiple Vitamin (MULTIVITAMIN WITH MINERALS) TABS tablet Take 1 tablet by mouth daily. 30 tablet 0  . ondansetron (ZOFRAN-ODT) 4 MG disintegrating tablet Take 4 mg by mouth every 8 (eight) hours as needed for nausea or vomiting.    [name] oxyCODONE-acetaminophen (PERCOCET/ROXICET) 5-325 MG tablet Take 1 tablet by mouth every 12 (twelve) hours.    . OXYGEN Inhale 2 L into the lungs continuous.    . pantoprazole (PROTONIX) 40 MG tablet Take 40 mg by mouth daily.    . polyethylene glycol powder (GLYCOLAX/MIRALAX) 17 GM/SCOOP powder Take 17 g by mouth daily as needed for mild constipation.    . Polyvinyl Alcohol-Povidone (REFRESH OP) Place 1 drop into both eyes daily as needed (dry eyes).    . potassium chloride (KLOR-CON) 10 MEQ tablet Take 20 mEq by mouth See admin instructions. Take 20 meq twice every other day (take only on torsemide days)    . protein supplement shake (PREMIER PROTEIN) LIQD Take 11 oz by mouth daily.    [name]  rOPINIRole (REQUIP) 1 MG tablet Take 1 tablet (1 mg total) by mouth in the morning and at bedtime.    . rosuvastatin (CRESTOR) 10 MG tablet Take 10 mg by mouth at bedtime.     [name] spironolactone (ALDACTONE) 25 MG tablet Take 1 tablet (25 mg total) by mouth daily. 90 tablet 3  . torsemide (DEMADEX) 20 MG tablet Take 40 mg by mouth every other day.    . traMADol-acetaminophen (ULTRACET) 37.5-325 MG tablet Take 2 tablets by mouth 2 (two) times daily.    . predniSONE (DELTASONE) 5 MG tablet Take 1 tablet (5 mg total) by mouth daily with breakfast. 30 tablet 3   No facility-administered medications prior to visit.      Review of Systems  Review of Systems  Constitutional: Negative.   Respiratory: Negative for cough, chest tightness, shortness of breath and wheezing.   Cardiovascular: Negative.      Physical Exam  BP 128/74   Pulse 98   Ht '5\' 11"'  (1.803 m)   Wt 196 lb (88.9 kg)   SpO2 97%   BMI 27.34 kg/m  Physical Exam Constitutional:      General: He is not in acute distress.    Appearance: Normal appearance. He is not ill-appearing.  HENT:     Mouth/Throat:     Comments: Deferred d/t masking Cardiovascular:     Rate and Rhythm: Normal rate and regular rhythm.     Comments: Regular on auscultation  (hx afib) Pulmonary:     Breath sounds: No wheezing, rhonchi or rales.     Comments: CTA Skin:    General: Skin is warm.     Comments: Bruising left forearm  Neurological:     General: No focal deficit present.     Mental Status: He is alert and oriented to person, place, and time. Mental status is at baseline.  Psychiatric:        Mood and Affect: Mood normal.        Behavior: Behavior normal.        Thought Content: Thought content normal.        Judgment: Judgment normal.      Lab Results:  CBC    Component Value Date/Time   WBC 9.7 07/19/2020 1025   WBC 9.8 05/29/2020 1816   RBC 4.76 07/19/2020 1025   RBC 5.32 05/29/2020 1816   HGB 14.1 07/19/2020 1025   HCT  43.3 07/19/2020 1025   PLT 165 07/19/2020  1025   MCV 91 07/19/2020 1025   MCH 29.6 07/19/2020 1025   MCH 29.9 05/29/2020 1816   MCHC 32.6 07/19/2020 1025   MCHC 32.3 05/29/2020 1816   RDW 14.0 07/19/2020 1025   LYMPHSABS 1.0 07/19/2020 1025   MONOABS 0.7 05/29/2020 1816   EOSABS 0.1 07/19/2020 1025   BASOSABS 0.0 07/19/2020 1025    BMET    Component Value Date/Time   NA 137 07/19/2020 1025   K 4.7 07/19/2020 1025   CL 96 07/19/2020 1025   CO2 24 07/19/2020 1025   GLUCOSE 212 (H) 07/19/2020 1025   GLUCOSE 122 (H) 07/10/2020 0920   BUN 28 (H) 07/19/2020 1025   CREATININE 1.76 (H) 07/19/2020 1025   CREATININE 1.65 (H) 01/29/2016 1543   CALCIUM 10.0 07/19/2020 1025   GFRNONAA 36 (L) 05/29/2020 1244   GFRAA 41 (L) 03/29/2020 1043    BNP    Component Value Date/Time   BNP 271.6 (H) 01/22/2020 0054   BNP 406.6 (H) 01/29/2016 1543    ProBNP    Component Value Date/Time   PROBNP 470.0 (H) 01/15/2016 1708    Imaging: ECHOCARDIOGRAM COMPLETE  Result Date: 07/12/2020    ECHOCARDIOGRAM REPORT   Patient Name:   HEWITT GARNER Date of Exam: 07/12/2020 Medical Rec #:  [id number]       Height:       71.0 in Accession #:    [id number]      Weight:       198.0 lb Date of Birth:  05-May-1950       BSA:          2.100 m Patient Age:    60 years        BP:           125/55 mmHg Patient Gender: M               HR:           69 bpm. Exam Location:  Outpatient Procedure: 2D Echo, Cardiac Doppler and Color Doppler Indications:    Congestive Heart Failure 428.0 / I50.9                 Pulmonary hypertension 416.8 / I27.2  History:        Patient has prior history of Echocardiogram examinations, most                 recent 12/13/2019. CHF, Pacemaker, COPD, Arrythmias:Atrial                 Fibrillation; Risk Factors:Hypertension and Dyslipidemia.                 Aortic Valve: bioprosthetic valve is present in the aortic                 position. Procedure Date: 08/22/2015.  Sonographer:    Vickie Epley RDCS Referring Phys: [id number] DANIEL R BENSIMHON IMPRESSIONS  1. Left ventricular ejection fraction, by estimation, is 55 to 60%. The left ventricle has normal function. The left ventricle has no regional wall motion abnormalities. Left ventricular diastolic parameters are consistent with Grade III diastolic dysfunction (restrictive).  2. Right ventricular systolic function is normal. The right ventricular size is mildly enlarged. There is mildly elevated pulmonary artery systolic pressure.  3. Left atrial size was moderately dilated.  4. Right atrial size was moderately dilated.  5. The mitral valve is normal in structure. Mild mitral valve regurgitation.  6. TR signal insufficient to  estimate RVSP accurately.  7. Normal functionin AoV bioprosthesis.     . The aortic valve has been repaired/replaced. There is mild calcification of the aortic valve. Aortic valve regurgitation is not visualized. There is a bioprosthetic valve present in the aortic position. Procedure Date: 08/22/2015.  8. Aortic dilatation noted. There is mild dilatation of the ascending aorta, measuring 42 mm. FINDINGS  Left Ventricle: Left ventricular ejection fraction, by estimation, is 55 to 60%. The left ventricle has normal function. The left ventricle has no regional wall motion abnormalities. The left ventricular internal cavity size was normal in size. There is  no left ventricular hypertrophy. Abnormal (paradoxical) septal motion consistent with post-operative status. Left ventricular diastolic parameters are consistent with Grade III diastolic dysfunction (restrictive). Right Ventricle: The right ventricular size is mildly enlarged. No increase in right ventricular wall thickness. Right ventricular systolic function is normal. There is mildly elevated pulmonary artery systolic pressure. The tricuspid regurgitant velocity is 2.97 m/s, and with an assumed right atrial pressure of 8 mmHg, the estimated right ventricular systolic pressure is  84.1 mmHg. Left Atrium: Left atrial size was moderately dilated. Right Atrium: Right atrial size was moderately dilated. Pericardium: There is no evidence of pericardial effusion. Mitral Valve: The mitral valve is normal in structure. Mild mitral valve regurgitation. Tricuspid Valve: TR signal insufficient to estimate RVSP accurately. The tricuspid valve is normal in structure. Tricuspid valve regurgitation is mild. Aortic Valve: Normal functionin AoV bioprosthesis. The aortic valve has been repaired/replaced. There is mild calcification of the aortic valve. Aortic valve regurgitation is not visualized. Aortic valve mean gradient measures 7.0 mmHg. Aortic valve peak gradient measures 12.7 mmHg. Aortic valve area, by  VTI measures 1.89 cm. There is a bioprosthetic valve present in the aortic position. Procedure Date: 08/22/2015. Pulmonic Valve: The pulmonic valve was grossly normal. Pulmonic valve regurgitation is not visualized. Aorta: Aortic dilatation noted. There is mild dilatation of the ascending aorta, measuring 42 mm. IAS/Shunts: No atrial level shunt detected by color flow Doppler. Additional Comments: A device lead is visualized.  LEFT VENTRICLE PLAX 2D LVIDd:         5.10 cm      Diastology LVIDs:         3.70 cm      LV e' medial:    4.31 cm/s LV PW:         0.90 cm      LV E/e' medial:  26.0 LV IVS:        0.90 cm      LV e' lateral:   3.19 cm/s LVOT diam:     2.20 cm      LV E/e' lateral: 35.1 LV SV:         68 LV SV Index:   32 LVOT Area:     3.80 cm  LV Volumes (MOD) LV vol d, MOD A2C: 138.0 ml LV vol d, MOD A4C: 147.0 ml LV vol s, MOD A2C: 67.0 ml LV vol s, MOD A4C: 71.4 ml LV SV MOD A2C:     71.0 ml LV SV MOD A4C:     147.0 ml LV SV MOD BP:      74.1 ml RIGHT VENTRICLE RV S prime:     7.65 cm/s TAPSE (M-mode): 1.7 cm LEFT ATRIUM             Index       RIGHT ATRIUM           Index LA diam:  4.30 cm 2.05 cm/m  RA Area:     20.60 cm LA Vol (A2C):   50.6 ml 24.10 ml/m RA Volume:   59.00 ml   28.10 ml/m LA Vol (A4C):   46.3 ml 22.05 ml/m LA Biplane Vol: 48.5 ml 23.10 ml/m  AORTIC VALVE AV Area (Vmax):    1.70 cm AV Area (Vmean):   1.80 cm AV Area (VTI):     1.89 cm AV Vmax:           178.00 cm/s AV Vmean:          120.000 cm/s AV VTI:            0.360 m AV Peak Grad:      12.7 mmHg AV Mean Grad:      7.0 mmHg LVOT Vmax:         79.80 cm/s LVOT Vmean:        56.900 cm/s LVOT VTI:          0.179 m LVOT/AV VTI ratio: 0.50  AORTA Ao Root diam: 3.20 cm Ao Asc diam:  4.20 cm MITRAL VALVE                TRICUSPID VALVE MV Area (PHT): 4.89 cm     TR Peak grad:   35.3 mmHg MV Decel Time: 155 msec     TR Vmax:        297.00 cm/s MR Peak grad: 104.4 mmHg MR Vmax:      511.00 cm/s   SHUNTS MV E velocity: 112.00 cm/s  Systemic VTI:  0.18 m MV A velocity: 29.60 cm/s   Systemic Diam: 2.20 cm MV E/A ratio:  3.78 Glori Bickers MD Electronically signed by Glori Bickers MD Signature Date/Time: 07/12/2020/11:12:29 AM    Final    CUP PACEART INCLINIC DEVICE CHECK  Result Date: 07/19/2020 Pacemaker check in clinic. Normal device function. Sensing and Threshold test not conducted. Patient's presenting rhythm in clinic, A Flutter. Patient had cardioversion on 07/10/2020. At/AF burden 60%. Patient met RRT on 06/30/2020 and is scheduled for a  GEN change on 08/08/2020 with Dr. Rayann Heman.Rexene Edison, BSN, RN, EMT-P  CUP PACEART REMOTE DEVICE CHECK  Result Date: 07/18/2020 Scheduled remote reviewed. Normal device function.  Known RRT with gen change scheduled S/p DCCV, presenting rhythm AF/VP Next remote pending gen change    Assessment & Plan:   COPD mixed type ([place]) - Stable interval; Breathing has improved since last visit.  No recent exacerbations. He experiences intermittent chest tightness and wheezing. CAT score 18.  - Continue Breztri two puff twice daily; prn albuterol hfa  - Start Singualir 54m at bedtime  - Wean prednisone to 2.570mdaily x 4 weeks; if breathing is not worse on lower dose ok  to stop prednisone - PFTs in 3 months - FU 4-6 months with Dr. ByLamonte Sakai Chronic respiratory failure with hypoxia (HCC) - Continue to benefit from supplemental oxygen, uses 2L as needed on exertion - No new requirements   Atypical atrial flutter (HCC) -Unable to maintain sinus rhythm. Tikosyn was stopped by cardiology     ElMartyn EhrichNP 07/24/2020

## 2020-07-18 NOTE — Telephone Encounter (Signed)
Patient has had the following procedures completed:  Ablation 04/19/2020 Cardioversion 05/25/2020 Cardioversion 07/10/2020  Patient is now back in AF (rates are elevated). Called patient to send manual transmission, assess and check medication compliance. No answer, left VM on answering machine requesting call back.   Medications on file include: Eliquis 5 mg BID, Tikosyn 250 mcg BID, torsemide 40 mg every other day.

## 2020-07-18 NOTE — Telephone Encounter (Signed)
Will need to refer patient to AF clinic.

## 2020-07-19 ENCOUNTER — Other Ambulatory Visit: Payer: Self-pay

## 2020-07-19 ENCOUNTER — Encounter: Payer: Self-pay | Admitting: Internal Medicine

## 2020-07-19 ENCOUNTER — Encounter: Payer: Self-pay | Admitting: Primary Care

## 2020-07-19 ENCOUNTER — Ambulatory Visit: Payer: Medicare Other | Admitting: Internal Medicine

## 2020-07-19 ENCOUNTER — Ambulatory Visit: Payer: Medicare Other | Admitting: Primary Care

## 2020-07-19 VITALS — BP 128/74 | HR 98 | Ht 71.0 in | Wt 196.0 lb

## 2020-07-19 VITALS — BP 120/64 | HR 70 | Ht 71.0 in | Wt 197.6 lb

## 2020-07-19 DIAGNOSIS — I4819 Other persistent atrial fibrillation: Secondary | ICD-10-CM

## 2020-07-19 DIAGNOSIS — I5032 Chronic diastolic (congestive) heart failure: Secondary | ICD-10-CM

## 2020-07-19 DIAGNOSIS — I484 Atypical atrial flutter: Secondary | ICD-10-CM

## 2020-07-19 DIAGNOSIS — J449 Chronic obstructive pulmonary disease, unspecified: Secondary | ICD-10-CM

## 2020-07-19 DIAGNOSIS — I442 Atrioventricular block, complete: Secondary | ICD-10-CM | POA: Diagnosis not present

## 2020-07-19 DIAGNOSIS — J9611 Chronic respiratory failure with hypoxia: Secondary | ICD-10-CM

## 2020-07-19 LAB — CBC WITH DIFFERENTIAL/PLATELET
Basophils Absolute: 0 10*3/uL (ref 0.0–0.2)
Basos: 0 %
EOS (ABSOLUTE): 0.1 10*3/uL (ref 0.0–0.4)
Eos: 1 %
Hematocrit: 43.3 % (ref 37.5–51.0)
Hemoglobin: 14.1 g/dL (ref 13.0–17.7)
Immature Grans (Abs): 0.1 10*3/uL (ref 0.0–0.1)
Immature Granulocytes: 1 %
Lymphocytes Absolute: 1 10*3/uL (ref 0.7–3.1)
Lymphs: 10 %
MCH: 29.6 pg (ref 26.6–33.0)
MCHC: 32.6 g/dL (ref 31.5–35.7)
MCV: 91 fL (ref 79–97)
Monocytes Absolute: 0.8 10*3/uL (ref 0.1–0.9)
Monocytes: 8 %
Neutrophils Absolute: 7.8 10*3/uL — ABNORMAL HIGH (ref 1.4–7.0)
Neutrophils: 80 %
Platelets: 165 10*3/uL (ref 150–450)
RBC: 4.76 x10E6/uL (ref 4.14–5.80)
RDW: 14 % (ref 11.6–15.4)
WBC: 9.7 10*3/uL (ref 3.4–10.8)

## 2020-07-19 LAB — CUP PACEART INCLINIC DEVICE CHECK
Battery Remaining Longevity: 0 mo
Battery Voltage: 2.57 V
Brady Statistic RA Percent Paced: 0.9 %
Brady Statistic RV Percent Paced: 99.73 %
Date Time Interrogation Session: 20220323095900
Implantable Lead Implant Date: 20131203
Implantable Lead Implant Date: 20131203
Implantable Lead Location: 753859
Implantable Lead Location: 753860
Implantable Lead Model: 1948
Implantable Pulse Generator Implant Date: 20131203
Lead Channel Impedance Value: 450 Ohm
Lead Channel Impedance Value: 650 Ohm
Lead Channel Setting Pacing Amplitude: 1.125
Lead Channel Setting Pacing Amplitude: 1.875
Lead Channel Setting Pacing Pulse Width: 0.8 ms
Lead Channel Setting Sensing Sensitivity: 4 mV
Pulse Gen Model: 2210
Pulse Gen Serial Number: 7408461

## 2020-07-19 LAB — BASIC METABOLIC PANEL
BUN/Creatinine Ratio: 16 (ref 10–24)
BUN: 28 mg/dL — ABNORMAL HIGH (ref 8–27)
CO2: 24 mmol/L (ref 20–29)
Calcium: 10 mg/dL (ref 8.6–10.2)
Chloride: 96 mmol/L (ref 96–106)
Creatinine, Ser: 1.76 mg/dL — ABNORMAL HIGH (ref 0.76–1.27)
Glucose: 212 mg/dL — ABNORMAL HIGH (ref 65–99)
Potassium: 4.7 mmol/L (ref 3.5–5.2)
Sodium: 137 mmol/L (ref 134–144)
eGFR: 41 mL/min/{1.73_m2} — ABNORMAL LOW (ref >59)

## 2020-07-19 MED ORDER — MONTELUKAST SODIUM 10 MG PO TABS: 10.0000 mg | ORAL_TABLET | Freq: Every day | ORAL | 11 refills | Status: DC

## 2020-07-19 MED ORDER — PREDNISONE 5 MG PO TABS: 5.0000 mg | ORAL_TABLET | Freq: Every day | ORAL | 0 refills | Status: DC

## 2020-07-19 NOTE — Telephone Encounter (Signed)
Patient has apt. With Dr. Rayann Heman 07/19/20. Will not need referral to AF clinic.

## 2020-07-19 NOTE — Progress Notes (Signed)
PCP: Velna Hatchet, MD Primary Cardiologist: Dr Haroldine Laws Primary EP:  Dr Rayann Heman  [name] is a 70 y.o. male who presents today for routine electrophysiology followup.  Since last being seen in our clinic, the patient reports doing very well.   He is in afib again today (after recent cardioversion) but mostly unaware.  Breathing well, without O2 today.  Today, he denies symptoms of palpitations, chest pain,  lower extremity edema, dizziness, presyncope, or syncope.  The patient is otherwise without complaint today.   Past Medical History:  Diagnosis Date  . Aortic stenosis, mild   . Arthritis of hand    "just a little bit in both hands" (03/31/2012)  . Asthma    "little bit" (03/31/2012)  . Atrial fibrillation [facility])    dx '04; DCCV '04, placed on flecainide, failed DCCV 04/2010, flecainide stopped; s/p successful A.fib ablation 01/31/12  . CHF (congestive heart failure) ([place])   . Controlled type 2 diabetes mellitus without complication, without long-term current use of insulin ([place]) 12/13/2019  . COPD (chronic obstructive pulmonary disease) ([place])   . DJD (degenerative joint disease)   . Fibromyalgia   . GERD (gastroesophageal reflux disease)   . Hyperlipidemia   . Hypertension   . Hypothyroidism    S/P radiation  . Left atrial enlargement    LA size 66mm by echo 11/21/11  . Mitral regurgitation    trivial  . Obstructive sleep apnea    mild by sleep study 2013; pt stated he does not have a machine because "it wasn't bad enough for him to have one"  . Pacemaker 03/31/2012  . Restless leg syndrome   . Second degree AV block   . Splenomegaly   . Visit for monitoring Tikosyn therapy 04/16/2016   Past Surgical History:  Procedure Laterality Date  . AORTIC VALVE REPLACEMENT N/A 08/22/2015   Procedure: AORTIC VALVE REPLACEMENT (AVR);  Surgeon: Ivin Poot, MD;  Location: [place];  Service: Open Heart Surgery;  Laterality: N/A;  . ATRIAL FIBRILLATION ABLATION  01/30/2012    PVI by Dr. Rayann Heman  . ATRIAL FIBRILLATION ABLATION N/A 01/31/2012   Procedure: ATRIAL FIBRILLATION ABLATION;  Surgeon: Thompson Grayer, MD;  Location: [facility] CATH LAB;  Service: Cardiovascular;  Laterality: N/A;  . ATRIAL FIBRILLATION ABLATION N/A 04/19/2020   Procedure: ATRIAL FIBRILLATION ABLATION;  Surgeon: Thompson Grayer, MD;  Location: [place] CV LAB;  Service: Cardiovascular;  Laterality: N/A;  . BACK SURGERY     X 3  . CARDIAC CATHETERIZATION N/A 08/09/2015   Procedure: Right/Left Heart Cath and Coronary Angiography;  Surgeon: Peter M Martinique, MD;  Location: [place] CV LAB;  Service: Cardiovascular;  Laterality: N/A;  . CARDIAC CATHETERIZATION N/A 02/05/2016   Procedure: Right/Left Heart Cath and Coronary Angiography;  Surgeon: Jettie Booze, MD;  Location: [place] CV LAB;  Service: Cardiovascular;  Laterality: N/A;  . CARDIAC CATHETERIZATION N/A 04/17/2016   Procedure: Right Heart Cath;  Surgeon: Jolaine Artist, MD;  Location: [place] CV LAB;  Service: Cardiovascular;  Laterality: N/A;  . CARDIOVASCULAR STRESS TEST  03/12/2002   EF 48%, NO EVIDENCE OF ISCHEMIA  . CARDIOVERSION  01/2012; 03/31/2012  . CARDIOVERSION N/A 02/25/2012   Procedure: CARDIOVERSION;  Surgeon: Thompson Grayer, MD;  Location: [facility] CATH LAB;  Service: Cardiovascular;  Laterality: N/A;  . CARDIOVERSION N/A 03/31/2012   Procedure: CARDIOVERSION;  Surgeon: Thompson Grayer, MD;  Location: [facility] CATH LAB;  Service: Cardiovascular;  Laterality: N/A;  . CARDIOVERSION N/A 11/23/2015  Procedure: CARDIOVERSION;  Surgeon: Larey Dresser, MD;  Location: [place];  Service: Cardiovascular;  Laterality: N/A;  . CARDIOVERSION N/A 04/08/2016   Procedure: CARDIOVERSION;  Surgeon: Jolaine Artist, MD;  Location: [place];  Service: Cardiovascular;  Laterality: N/A;  . CARDIOVERSION N/A 09/14/2018   Procedure: CARDIOVERSION;  Surgeon: Josue Hector, MD;  Location: [place];  Service: Cardiovascular;  Laterality:  N/A;  . CARDIOVERSION N/A 10/19/2019   Procedure: CARDIOVERSION;  Surgeon: Jolaine Artist, MD;  Location: [facility] ENDOSCOPY;  Service: Cardiovascular;  Laterality: N/A;  . CARDIOVERSION N/A 02/25/2020   Procedure: CARDIOVERSION;  Surgeon: Fay Records, MD;  Location: [place];  Service: Cardiovascular;  Laterality: N/A;  . CARDIOVERSION N/A 05/25/2020   Procedure: CARDIOVERSION;  Surgeon: Lelon Perla, MD;  Location: [facility] ENDOSCOPY;  Service: Cardiovascular;  Laterality: N/A;  . CARDIOVERSION N/A 07/10/2020   Procedure: CARDIOVERSION;  Surgeon: Jerline Pain, MD;  Location: [place];  Service: Cardiovascular;  Laterality: N/A;  . CLIPPING OF ATRIAL APPENDAGE N/A 08/22/2015   Procedure: CLIPPING OF ATRIAL APPENDAGE;  Surgeon: Ivin Poot, MD;  Location: [place];  Service: Open Heart Surgery;  Laterality: N/A;  . DOPPLER ECHOCARDIOGRAPHY  03/11/2002   EF 70-75%  . FINGER SURGERY Left    Middle finger  . FINGER TENDON REPAIR  1980's   "right little finger" (03/31/2012)  . INSERT / REPLACE / REMOVE PACEMAKER     [employer]  . IR RADIOLOGIST EVAL & MGMT  05/06/2019  . IR RADIOLOGIST EVAL & MGMT  10/06/2019  . LEFT AND RIGHT HEART CATHETERIZATION WITH CORONARY ANGIOGRAM N/A 10/27/2012   Procedure: LEFT AND RIGHT HEART CATHETERIZATION WITH CORONARY ANGIOGRAM;  Surgeon: Laverda Page, MD;  Location: [facility] CATH LAB;  Service: Cardiovascular;  Laterality: N/A;  . LUMBAR DISC SURGERY  1980's X2;  2000's  . MAZE N/A 08/22/2015   Procedure: MAZE;  Surgeon: Ivin Poot, MD;  Location: [place];  Service: Open Heart Surgery;  Laterality: N/A;  . PACEMAKER INSERTION  03/31/2012   STJ Accent DR pacemaker implanted by Dr Rayann Heman  . PERMANENT PACEMAKER INSERTION N/A 03/31/2012   Procedure: PERMANENT PACEMAKER INSERTION;  Surgeon: Thompson Grayer, MD;  Location: [facility] CATH LAB;  Service: Cardiovascular;  Laterality: N/A;  . TEE WITHOUT CARDIOVERSION  01/30/2012   Procedure: TRANSESOPHAGEAL ECHOCARDIOGRAM (TEE);   Surgeon: Larey Dresser, MD;  Location: [place];  Service: Cardiovascular;  Laterality: N/A;  ablation next day  . TEE WITHOUT CARDIOVERSION N/A 08/22/2015   Procedure: TRANSESOPHAGEAL ECHOCARDIOGRAM (TEE);  Surgeon: Ivin Poot, MD;  Location: [place];  Service: Open Heart Surgery;  Laterality: N/A;  . TEE WITHOUT CARDIOVERSION N/A 10/19/2019   Procedure: TRANSESOPHAGEAL ECHOCARDIOGRAM (TEE);  Surgeon: Jolaine Artist, MD;  Location: [facility] ENDOSCOPY;  Service: Cardiovascular;  Laterality: N/A;  . US ECHOCARDIOGRAPHY  08/07/2009   EF 55-60%    ROS- all systems are reviewed and negative except as per HPI above  Current Outpatient Medications  Medication Sig Dispense Refill  . albuterol (PROVENTIL HFA;VENTOLIN HFA) 108 (90 Base) MCG/ACT inhaler Inhale 2 puffs into the lungs every 6 (six) hours as needed for wheezing or shortness of breath. 1 Inhaler 5  . amLODipine (NORVASC) 10 MG tablet Take 0.5 tablets (5 mg total) by mouth daily. 30 tablet 11  . apixaban (ELIQUIS) 5 MG TABS tablet Take 1 tablet (5 mg total) by mouth 2 (two) times daily. 180 tablet 3  . bisoprolol (ZEBETA) 5 MG tablet Take 5  mg by mouth daily.    . Budeson-Glycopyrrol-Formoterol (BREZTRI AEROSPHERE) 160-9-4.8 MCG/ACT AERO Inhale 2 puffs into the lungs in the morning and at bedtime. 5.9 g 0  . Cholecalciferol (VITAMIN D) 2000 UNITS CAPS Take 8,000 Units by mouth at bedtime.     . dofetilide (TIKOSYN) 250 MCG capsule Take 1 capsule (250 mcg total) by mouth 2 (two) times daily. 180 capsule 1  . empagliflozin (JARDIANCE) 10 MG TABS tablet Take 1 tablet (10 mg total) by mouth daily before breakfast. 30 tablet 6  . fexofenadine (ALLEGRA) 180 MG tablet Take 1 tablet (180 mg total) by mouth daily as needed for allergies or rhinitis.    [name] gabapentin (NEURONTIN) 300 MG capsule Take 600-1,200 mg by mouth See admin instructions. Take 600 mg by mouth in the morning, 600 mg at lunchtime, and take 1200 mg at bedtime  3  . levothyroxine  (SYNTHROID, LEVOTHROID) 175 MCG tablet Take 175 mcg by mouth daily before breakfast.    . Multiple Minerals-Vitamins (CAL-MAG-ZINC-D) TABS Take 1 tablet by mouth daily.    . Multiple Vitamin (MULTIVITAMIN WITH MINERALS) TABS tablet Take 1 tablet by mouth daily. 30 tablet 0  . ondansetron (ZOFRAN-ODT) 4 MG disintegrating tablet Take 4 mg by mouth every 8 (eight) hours as needed for nausea or vomiting.    [name] oxyCODONE-acetaminophen (PERCOCET/ROXICET) 5-325 MG tablet Take 1 tablet by mouth every 12 (twelve) hours.    . OXYGEN Inhale 2 L into the lungs continuous.    . pantoprazole (PROTONIX) 40 MG tablet Take 40 mg by mouth daily.    . polyethylene glycol powder (GLYCOLAX/MIRALAX) 17 GM/SCOOP powder Take 17 g by mouth daily as needed for mild constipation.    . Polyvinyl Alcohol-Povidone (REFRESH OP) Place 1 drop into both eyes daily as needed (dry eyes).    . potassium chloride (KLOR-CON) 10 MEQ tablet Take 20 mEq by mouth See admin instructions. Take 20 meq twice every other day (take only on torsemide days)    . predniSONE (DELTASONE) 5 MG tablet Take 1 tablet (5 mg total) by mouth daily with breakfast. 30 tablet 3  . protein supplement shake (PREMIER PROTEIN) LIQD Take 11 oz by mouth daily.    [name] rOPINIRole (REQUIP) 1 MG tablet Take 1 tablet (1 mg total) by mouth in the morning and at bedtime.    . rosuvastatin (CRESTOR) 10 MG tablet Take 10 mg by mouth at bedtime.     [name] spironolactone (ALDACTONE) 25 MG tablet Take 1 tablet (25 mg total) by mouth daily. 90 tablet 3  . torsemide (DEMADEX) 20 MG tablet Take 40 mg by mouth every other day.    . traMADol-acetaminophen (ULTRACET) 37.5-325 MG tablet Take 2 tablets by mouth 2 (two) times daily.     No current facility-administered medications for this visit.    Physical Exam: Vitals:   07/19/20 0954  BP: 120/64  Pulse: 70  SpO2: 97%  Weight: 197 lb 9.6 oz (89.6 kg)  Height: 5\' 11"  (1.803 m)    GEN- The patient is well appearing, alert and  oriented x 3 today.   Head- normocephalic, atraumatic Eyes-  Sclera clear, conjunctiva pink Ears- hearing intact Oropharynx- clear Lungs- Clear to ausculation bilaterally, normal work of breathing Chest- pacemaker pocket is well healed Heart- Regular rate and rhythm (paced) GI- soft, NT, ND, + BS Extremities- no clubbing, cyanosis, or edema  Pacemaker interrogation- reviewed in detail today,  See PACEART report  ekg tracing ordered today is personally reviewed and  shows atypical atrial flutter, V paced  Assessment and Plan:  1.  Symptomatic complete heart block Normal pacemaker function See Pace Art report No changes today he is device dependant today He has reached ERI. EF is preserved.  No indication for upgrade. Risks, benefits, and alternatives to pacemaker pulse generator replacement were discussed in detail today.  The patient understands that risks include but are not limited to bleeding, infection, pneumothorax, perforation, tamponade, vascular damage, renal failure, MI, stroke, death, damage to his existing leads, and lead dislodgement and wishes to proceed.  We will therefore schedule the procedure at the next available time. Hold eliquis 24 hours prior to the procedure  2. Persistent afib/ atypical atrial flutter S/p MAZE and multiple prior PVIs Already back in an atypical atrial flutter today Rate control is our only realistic option going forward Stop tikosyn today  3. Chronic diastolic dysfunction with RV failure/ pulmonary HTN EF is preserved Follows with Dr Haroldine Laws Our options for treatment are limited.  4. SOB Multifactorial Unable to maintain sinus rhythm Follows with pulmonary and CHF teams  Risks, benefits and potential toxicities for medications prescribed and/or refilled reviewed with patient today.   Thompson Grayer MD, [facility] 07/19/2020 10:10 AM

## 2020-07-19 NOTE — H&P (View-Only) (Signed)
PCP: Velna Hatchet, MD Primary Cardiologist: Dr Haroldine Laws Primary EP:  Dr Rayann Heman  [name] is a 70 y.o. male who presents today for routine electrophysiology followup.  Since last being seen in our clinic, the patient reports doing very well.   He is in afib again today (after recent cardioversion) but mostly unaware.  Breathing well, without O2 today.  Today, he denies symptoms of palpitations, chest pain,  lower extremity edema, dizziness, presyncope, or syncope.  The patient is otherwise without complaint today.   Past Medical History:  Diagnosis Date  . Aortic stenosis, mild   . Arthritis of hand    "just a little bit in both hands" (03/31/2012)  . Asthma    "little bit" (03/31/2012)  . Atrial fibrillation [facility])    dx '04; DCCV '04, placed on flecainide, failed DCCV 04/2010, flecainide stopped; s/p successful A.fib ablation 01/31/12  . CHF (congestive heart failure) ([place])   . Controlled type 2 diabetes mellitus without complication, without long-term current use of insulin ([place]) 12/13/2019  . COPD (chronic obstructive pulmonary disease) ([place])   . DJD (degenerative joint disease)   . Fibromyalgia   . GERD (gastroesophageal reflux disease)   . Hyperlipidemia   . Hypertension   . Hypothyroidism    S/P radiation  . Left atrial enlargement    LA size 51mm by echo 11/21/11  . Mitral regurgitation    trivial  . Obstructive sleep apnea    mild by sleep study 2013; pt stated he does not have a machine because "it wasn't bad enough for him to have one"  . Pacemaker 03/31/2012  . Restless leg syndrome   . Second degree AV block   . Splenomegaly   . Visit for monitoring Tikosyn therapy 04/16/2016   Past Surgical History:  Procedure Laterality Date  . AORTIC VALVE REPLACEMENT N/A 08/22/2015   Procedure: AORTIC VALVE REPLACEMENT (AVR);  Surgeon: Ivin Poot, MD;  Location: [place];  Service: Open Heart Surgery;  Laterality: N/A;  . ATRIAL FIBRILLATION ABLATION  01/30/2012    PVI by Dr. Rayann Heman  . ATRIAL FIBRILLATION ABLATION N/A 01/31/2012   Procedure: ATRIAL FIBRILLATION ABLATION;  Surgeon: Thompson Grayer, MD;  Location: [facility] CATH LAB;  Service: Cardiovascular;  Laterality: N/A;  . ATRIAL FIBRILLATION ABLATION N/A 04/19/2020   Procedure: ATRIAL FIBRILLATION ABLATION;  Surgeon: Thompson Grayer, MD;  Location: [place] CV LAB;  Service: Cardiovascular;  Laterality: N/A;  . BACK SURGERY     X 3  . CARDIAC CATHETERIZATION N/A 08/09/2015   Procedure: Right/Left Heart Cath and Coronary Angiography;  Surgeon: Peter M Martinique, MD;  Location: [place] CV LAB;  Service: Cardiovascular;  Laterality: N/A;  . CARDIAC CATHETERIZATION N/A 02/05/2016   Procedure: Right/Left Heart Cath and Coronary Angiography;  Surgeon: Jettie Booze, MD;  Location: [place] CV LAB;  Service: Cardiovascular;  Laterality: N/A;  . CARDIAC CATHETERIZATION N/A 04/17/2016   Procedure: Right Heart Cath;  Surgeon: Jolaine Artist, MD;  Location: [place] CV LAB;  Service: Cardiovascular;  Laterality: N/A;  . CARDIOVASCULAR STRESS TEST  03/12/2002   EF 48%, NO EVIDENCE OF ISCHEMIA  . CARDIOVERSION  01/2012; 03/31/2012  . CARDIOVERSION N/A 02/25/2012   Procedure: CARDIOVERSION;  Surgeon: Thompson Grayer, MD;  Location: [facility] CATH LAB;  Service: Cardiovascular;  Laterality: N/A;  . CARDIOVERSION N/A 03/31/2012   Procedure: CARDIOVERSION;  Surgeon: Thompson Grayer, MD;  Location: [facility] CATH LAB;  Service: Cardiovascular;  Laterality: N/A;  . CARDIOVERSION N/A 11/23/2015  Procedure: CARDIOVERSION;  Surgeon: Larey Dresser, MD;  Location: [place];  Service: Cardiovascular;  Laterality: N/A;  . CARDIOVERSION N/A 04/08/2016   Procedure: CARDIOVERSION;  Surgeon: Jolaine Artist, MD;  Location: [place];  Service: Cardiovascular;  Laterality: N/A;  . CARDIOVERSION N/A 09/14/2018   Procedure: CARDIOVERSION;  Surgeon: Josue Hector, MD;  Location: [place];  Service: Cardiovascular;  Laterality:  N/A;  . CARDIOVERSION N/A 10/19/2019   Procedure: CARDIOVERSION;  Surgeon: Jolaine Artist, MD;  Location: [facility] ENDOSCOPY;  Service: Cardiovascular;  Laterality: N/A;  . CARDIOVERSION N/A 02/25/2020   Procedure: CARDIOVERSION;  Surgeon: Fay Records, MD;  Location: [place];  Service: Cardiovascular;  Laterality: N/A;  . CARDIOVERSION N/A 05/25/2020   Procedure: CARDIOVERSION;  Surgeon: Lelon Perla, MD;  Location: [facility] ENDOSCOPY;  Service: Cardiovascular;  Laterality: N/A;  . CARDIOVERSION N/A 07/10/2020   Procedure: CARDIOVERSION;  Surgeon: Jerline Pain, MD;  Location: [place];  Service: Cardiovascular;  Laterality: N/A;  . CLIPPING OF ATRIAL APPENDAGE N/A 08/22/2015   Procedure: CLIPPING OF ATRIAL APPENDAGE;  Surgeon: Ivin Poot, MD;  Location: [place];  Service: Open Heart Surgery;  Laterality: N/A;  . DOPPLER ECHOCARDIOGRAPHY  03/11/2002   EF 70-75%  . FINGER SURGERY Left    Middle finger  . FINGER TENDON REPAIR  1980's   "right little finger" (03/31/2012)  . INSERT / REPLACE / REMOVE PACEMAKER     [employer]  . IR RADIOLOGIST EVAL & MGMT  05/06/2019  . IR RADIOLOGIST EVAL & MGMT  10/06/2019  . LEFT AND RIGHT HEART CATHETERIZATION WITH CORONARY ANGIOGRAM N/A 10/27/2012   Procedure: LEFT AND RIGHT HEART CATHETERIZATION WITH CORONARY ANGIOGRAM;  Surgeon: Laverda Page, MD;  Location: [facility] CATH LAB;  Service: Cardiovascular;  Laterality: N/A;  . LUMBAR DISC SURGERY  1980's X2;  2000's  . MAZE N/A 08/22/2015   Procedure: MAZE;  Surgeon: Ivin Poot, MD;  Location: [place];  Service: Open Heart Surgery;  Laterality: N/A;  . PACEMAKER INSERTION  03/31/2012   STJ Accent DR pacemaker implanted by Dr Rayann Heman  . PERMANENT PACEMAKER INSERTION N/A 03/31/2012   Procedure: PERMANENT PACEMAKER INSERTION;  Surgeon: Thompson Grayer, MD;  Location: [facility] CATH LAB;  Service: Cardiovascular;  Laterality: N/A;  . TEE WITHOUT CARDIOVERSION  01/30/2012   Procedure: TRANSESOPHAGEAL ECHOCARDIOGRAM (TEE);   Surgeon: Larey Dresser, MD;  Location: [place];  Service: Cardiovascular;  Laterality: N/A;  ablation next day  . TEE WITHOUT CARDIOVERSION N/A 08/22/2015   Procedure: TRANSESOPHAGEAL ECHOCARDIOGRAM (TEE);  Surgeon: Ivin Poot, MD;  Location: [place];  Service: Open Heart Surgery;  Laterality: N/A;  . TEE WITHOUT CARDIOVERSION N/A 10/19/2019   Procedure: TRANSESOPHAGEAL ECHOCARDIOGRAM (TEE);  Surgeon: Jolaine Artist, MD;  Location: [facility] ENDOSCOPY;  Service: Cardiovascular;  Laterality: N/A;  . US ECHOCARDIOGRAPHY  08/07/2009   EF 55-60%    ROS- all systems are reviewed and negative except as per HPI above  Current Outpatient Medications  Medication Sig Dispense Refill  . albuterol (PROVENTIL HFA;VENTOLIN HFA) 108 (90 Base) MCG/ACT inhaler Inhale 2 puffs into the lungs every 6 (six) hours as needed for wheezing or shortness of breath. 1 Inhaler 5  . amLODipine (NORVASC) 10 MG tablet Take 0.5 tablets (5 mg total) by mouth daily. 30 tablet 11  . apixaban (ELIQUIS) 5 MG TABS tablet Take 1 tablet (5 mg total) by mouth 2 (two) times daily. 180 tablet 3  . bisoprolol (ZEBETA) 5 MG tablet Take 5  mg by mouth daily.    . Budeson-Glycopyrrol-Formoterol (BREZTRI AEROSPHERE) 160-9-4.8 MCG/ACT AERO Inhale 2 puffs into the lungs in the morning and at bedtime. 5.9 g 0  . Cholecalciferol (VITAMIN D) 2000 UNITS CAPS Take 8,000 Units by mouth at bedtime.     . dofetilide (TIKOSYN) 250 MCG capsule Take 1 capsule (250 mcg total) by mouth 2 (two) times daily. 180 capsule 1  . empagliflozin (JARDIANCE) 10 MG TABS tablet Take 1 tablet (10 mg total) by mouth daily before breakfast. 30 tablet 6  . fexofenadine (ALLEGRA) 180 MG tablet Take 1 tablet (180 mg total) by mouth daily as needed for allergies or rhinitis.    [name] gabapentin (NEURONTIN) 300 MG capsule Take 600-1,200 mg by mouth See admin instructions. Take 600 mg by mouth in the morning, 600 mg at lunchtime, and take 1200 mg at bedtime  3  . levothyroxine  (SYNTHROID, LEVOTHROID) 175 MCG tablet Take 175 mcg by mouth daily before breakfast.    . Multiple Minerals-Vitamins (CAL-MAG-ZINC-D) TABS Take 1 tablet by mouth daily.    . Multiple Vitamin (MULTIVITAMIN WITH MINERALS) TABS tablet Take 1 tablet by mouth daily. 30 tablet 0  . ondansetron (ZOFRAN-ODT) 4 MG disintegrating tablet Take 4 mg by mouth every 8 (eight) hours as needed for nausea or vomiting.    [name] oxyCODONE-acetaminophen (PERCOCET/ROXICET) 5-325 MG tablet Take 1 tablet by mouth every 12 (twelve) hours.    . OXYGEN Inhale 2 L into the lungs continuous.    . pantoprazole (PROTONIX) 40 MG tablet Take 40 mg by mouth daily.    . polyethylene glycol powder (GLYCOLAX/MIRALAX) 17 GM/SCOOP powder Take 17 g by mouth daily as needed for mild constipation.    . Polyvinyl Alcohol-Povidone (REFRESH OP) Place 1 drop into both eyes daily as needed (dry eyes).    . potassium chloride (KLOR-CON) 10 MEQ tablet Take 20 mEq by mouth See admin instructions. Take 20 meq twice every other day (take only on torsemide days)    . predniSONE (DELTASONE) 5 MG tablet Take 1 tablet (5 mg total) by mouth daily with breakfast. 30 tablet 3  . protein supplement shake (PREMIER PROTEIN) LIQD Take 11 oz by mouth daily.    [name] rOPINIRole (REQUIP) 1 MG tablet Take 1 tablet (1 mg total) by mouth in the morning and at bedtime.    . rosuvastatin (CRESTOR) 10 MG tablet Take 10 mg by mouth at bedtime.     [name] spironolactone (ALDACTONE) 25 MG tablet Take 1 tablet (25 mg total) by mouth daily. 90 tablet 3  . torsemide (DEMADEX) 20 MG tablet Take 40 mg by mouth every other day.    . traMADol-acetaminophen (ULTRACET) 37.5-325 MG tablet Take 2 tablets by mouth 2 (two) times daily.     No current facility-administered medications for this visit.    Physical Exam: Vitals:   07/19/20 0954  BP: 120/64  Pulse: 70  SpO2: 97%  Weight: 197 lb 9.6 oz (89.6 kg)  Height: 5\' 11"  (1.803 m)    GEN- The patient is well appearing, alert and  oriented x 3 today.   Head- normocephalic, atraumatic Eyes-  Sclera clear, conjunctiva pink Ears- hearing intact Oropharynx- clear Lungs- Clear to ausculation bilaterally, normal work of breathing Chest- pacemaker pocket is well healed Heart- Regular rate and rhythm (paced) GI- soft, NT, ND, + BS Extremities- no clubbing, cyanosis, or edema  Pacemaker interrogation- reviewed in detail today,  See PACEART report  ekg tracing ordered today is personally reviewed and  shows atypical atrial flutter, V paced  Assessment and Plan:  1.  Symptomatic complete heart block Normal pacemaker function See Pace Art report No changes today he is device dependant today He has reached ERI. EF is preserved.  No indication for upgrade. Risks, benefits, and alternatives to pacemaker pulse generator replacement were discussed in detail today.  The patient understands that risks include but are not limited to bleeding, infection, pneumothorax, perforation, tamponade, vascular damage, renal failure, MI, stroke, death, damage to his existing leads, and lead dislodgement and wishes to proceed.  We will therefore schedule the procedure at the next available time. Hold eliquis 24 hours prior to the procedure  2. Persistent afib/ atypical atrial flutter S/p MAZE and multiple prior PVIs Already back in an atypical atrial flutter today Rate control is our only realistic option going forward Stop tikosyn today  3. Chronic diastolic dysfunction with RV failure/ pulmonary HTN EF is preserved Follows with Dr Haroldine Laws Our options for treatment are limited.  4. SOB Multifactorial Unable to maintain sinus rhythm Follows with pulmonary and CHF teams  Risks, benefits and potential toxicities for medications prescribed and/or refilled reviewed with patient today.   Thompson Grayer MD, [facility] 07/19/2020 10:10 AM

## 2020-07-19 NOTE — Patient Instructions (Addendum)
Recommendations: - Start Singualir 10mg  at bedtime  - Wean prednisone to 1/2 tab (2.5mg ) daily x 4 weeks; if breathing is not worse on lower dose than you can try stopping (if breathing exacerbates off prednisone restart at lowest most effective dose) - Continue Breztri two puffs morning and evening  Orders: - PFT in 3 months   RX: - Montelukast (Singulair)  - Prednisone as directed   Follow-up: - 4-6 months with Dr. Lamonte Sakai only     Montelukast Tablets What is this medicine? MONTELUKAST (mon te LOO kast) is used to prevent and treat the symptoms of asthma. It is also used to treat allergies. Do not use for an acute asthma attack. This medicine may be used for other purposes; ask your health care provider or pharmacist if you have questions. COMMON BRAND NAME(S): Singulair What should I tell my health care provider before I take this medicine? They need to know if you have any of these conditions:  liver disease  an unusual or allergic reaction to montelukast, other medicines, foods, dyes, or preservatives  pregnant or trying to get pregnant  breast-feeding How should I use this medicine? This medicine should be given by mouth. Follow the directions on the prescription label. Take this medicine at the same time every day. You may take this medicine with or without meals. Do not chew the tablets. Do not stop taking your medicine unless your doctor tells you to. Talk to your pediatrician regarding the use of this medicine in children. Special care may be needed. While this drug may be prescribed for children as young as 44 years of age for selected conditions, precautions do apply. Overdosage: If you think you have taken too much of this medicine contact a poison control center or emergency room at once. NOTE: This medicine is only for you. Do not share this medicine with others. What if I miss a dose? If you miss a dose, skip it. Take your next dose at the normal time. Do not take  extra or 2 doses at the same time to make up for the missed dose. What may interact with this medicine?  anti-infectives like rifampin and rifabutin  medicines for seizures like phenytoin, phenobarbital, and carbamazepine This list may not describe all possible interactions. Give your health care provider a list of all the medicines, herbs, non-prescription drugs, or dietary supplements you use. Also tell them if you smoke, drink alcohol, or use illegal drugs. Some items may interact with your medicine. What should I watch for while using this medicine? Visit your doctor or health care professional for regular checks on your progress. Tell your doctor or health care professional if your allergy or asthma symptoms do not improve. Take your medicine even when you do not have symptoms. Do not stop taking any of your medicine(s) unless your doctor tells you to. If you have asthma, talk to your doctor about what to do in an acute asthma attack. Always have your inhaled rescue medicine for asthma attacks with you. Patients and their families should watch for new or worsening thoughts of suicide or depression. Also watch for sudden changes in feelings such as feeling anxious, agitated, panicky, irritable, hostile, aggressive, impulsive, severely restless, overly excited and hyperactive, or not being able to sleep. Any worsening of mood or thoughts of suicide or dying should be reported to your health care professional right away. What side effects may I notice from receiving this medicine? Side effects that you should report to your  doctor or health care professional as soon as possible:  allergic reactions like skin rash or hives, or swelling of the face, lips, or tongue  breathing problems  changes in emotions or moods  confusion  depressed mood  fever or infection  hallucinations  joint pain  painful lumps under the skin  pain, tingling, numbness in the hands or feet  redness,  blistering, peeling, or loosening of the skin, including inside the mouth  restlessness  seizures  sleep walking  signs and symptoms of infection like fever; chills; cough; sore throat; flu-like illness  signs and symptoms of liver injury like dark yellow or brown urine; general ill feeling or flu-like symptoms; light-colored stools; loss of appetite; nausea; right upper belly pain; unusually weak or tired; yellowing of the eyes or skin  sinus pain or swelling  stuttering  suicidal thoughts or other mood changes  tremors  trouble sleeping  uncontrolled muscle movements  unusual bleeding or bruising  vivid or bad dreams Side effects that usually do not require medical attention (report to your doctor or health care professional if they continue or are bothersome):  dizziness  drowsiness  headache  runny nose  stomach upset  tiredness This list may not describe all possible side effects. Call your doctor for medical advice about side effects. You may report side effects to FDA at 1-800-FDA-1088. Where should I keep my medicine? Keep out of the reach of children. Store at room temperature between 15 and 30 degrees C (59 and 86 degrees F). Protect from light and moisture. Keep this medicine in the original bottle. Throw away any unused medicine after the expiration date. NOTE: This sheet is a summary. It may not cover all possible information. If you have questions about this medicine, talk to your doctor, pharmacist, or health care provider.  2021 [employer]/Gold Standard (2020-03-11 15:22:47)

## 2020-07-24 ENCOUNTER — Other Ambulatory Visit: Payer: Self-pay | Admitting: Internal Medicine

## 2020-07-24 NOTE — Progress Notes (Signed)
Remote pacemaker transmission.   

## 2020-07-24 NOTE — Addendum Note (Signed)
Addended by: Cheri Kearns A on: 07/24/2020 08:46 AM   Modules accepted: Level of Service

## 2020-07-24 NOTE — Assessment & Plan Note (Signed)
-   Continue to benefit from supplemental oxygen, uses 2L as needed on exertion - No new requirements

## 2020-07-24 NOTE — Assessment & Plan Note (Addendum)
-   Stable interval; Breathing has improved since last visit.  No recent exacerbations. He experiences intermittent chest tightness and wheezing. CAT score 18.  - Continue Breztri two puff twice daily; prn albuterol hfa  - Start Singualir 10mg  at bedtime  - Wean prednisone to 2.5mg  daily x 4 weeks; if breathing is not worse on lower dose ok to stop prednisone - PFTs in 3 months - FU 4-6 months with Dr. Lamonte Sakai

## 2020-07-24 NOTE — Assessment & Plan Note (Signed)
-  Unable to maintain sinus rhythm. Tikosyn was stopped by cardiology

## 2020-07-26 ENCOUNTER — Other Ambulatory Visit: Payer: Self-pay | Admitting: Internal Medicine

## 2020-07-27 ENCOUNTER — Telehealth: Payer: Self-pay | Admitting: Primary Care

## 2020-07-27 DIAGNOSIS — J342 Deviated nasal septum: Secondary | ICD-10-CM | POA: Diagnosis not present

## 2020-07-27 DIAGNOSIS — R040 Epistaxis: Secondary | ICD-10-CM | POA: Diagnosis not present

## 2020-07-27 DIAGNOSIS — Z7901 Long term (current) use of anticoagulants: Secondary | ICD-10-CM | POA: Diagnosis not present

## 2020-07-27 DIAGNOSIS — J9611 Chronic respiratory failure with hypoxia: Secondary | ICD-10-CM

## 2020-07-27 NOTE — Telephone Encounter (Signed)
Spoke with pt's wife who is stating ENT recommends humidity be added to oxygen and a full face O2 mask vs a nasal cannula r/t nose bleeds. Dr. Lamonte Sakai may we place orders for these things?

## 2020-07-27 NOTE — Telephone Encounter (Signed)
Yes. I would think that his concentrator already has a reservoir for humidity. Will need to confirm that, and then have them learn how to use it. OK to get him a simple mask for the O2 in addition to his [place]

## 2020-07-27 NOTE — Telephone Encounter (Signed)
Called and spoke with Patient's wife Adonis Brook per DPR, and went over Dr Agustina Caroli recommendations. Christie agreeable to having order placed for [place] and simple mask. Order placed per Dr Lamonte Sakai for [employer] per wife request. Nothing further needed at this time.

## 2020-08-04 ENCOUNTER — Other Ambulatory Visit (HOSPITAL_COMMUNITY)
Admission: RE | Admit: 2020-08-04 | Discharge: 2020-08-04 | Disposition: A | Discharge status: Home / Self Care | Payer: Medicare Other | Source: Ambulatory Visit | Attending: Internal Medicine | Admitting: Internal Medicine

## 2020-08-04 DIAGNOSIS — Z20822 Contact with and (suspected) exposure to covid-19: Secondary | ICD-10-CM | POA: Insufficient documentation

## 2020-08-04 DIAGNOSIS — Z01812 Encounter for preprocedural laboratory examination: Secondary | ICD-10-CM | POA: Diagnosis not present

## 2020-08-05 LAB — SARS CORONAVIRUS 2 (TAT 6-24 HRS): SARS Coronavirus 2: NEGATIVE

## 2020-08-07 NOTE — Pre-Procedure Instructions (Signed)
Instructed patient on the following items: Arrival time 1130 Nothing to eat or drink after midnight No meds AM of procedure Responsible person to drive you home and stay with you for 24 hrs Wash with special soap night before and morning of procedure If on anti-coagulant drug instructions Eliquis- last done this am

## 2020-08-08 ENCOUNTER — Ambulatory Visit (HOSPITAL_COMMUNITY)
Admission: RE | Disposition: A | Discharge status: Home / Self Care | Payer: Self-pay | Source: Home / Self Care | Attending: Internal Medicine

## 2020-08-08 ENCOUNTER — Ambulatory Visit (HOSPITAL_COMMUNITY)
Admission: RE | Admit: 2020-08-08 | Discharge: 2020-08-08 | Disposition: A | Discharge status: Home / Self Care | Payer: Medicare Other | Attending: Internal Medicine | Admitting: Internal Medicine

## 2020-08-08 ENCOUNTER — Other Ambulatory Visit: Payer: Self-pay

## 2020-08-08 DIAGNOSIS — I484 Atypical atrial flutter: Secondary | ICD-10-CM | POA: Insufficient documentation

## 2020-08-08 DIAGNOSIS — Z79899 Other long term (current) drug therapy: Secondary | ICD-10-CM | POA: Insufficient documentation

## 2020-08-08 DIAGNOSIS — E119 Type 2 diabetes mellitus without complications: Secondary | ICD-10-CM | POA: Insufficient documentation

## 2020-08-08 DIAGNOSIS — I442 Atrioventricular block, complete: Secondary | ICD-10-CM | POA: Insufficient documentation

## 2020-08-08 DIAGNOSIS — I350 Nonrheumatic aortic (valve) stenosis: Secondary | ICD-10-CM | POA: Insufficient documentation

## 2020-08-08 DIAGNOSIS — I480 Paroxysmal atrial fibrillation: Secondary | ICD-10-CM | POA: Diagnosis not present

## 2020-08-08 DIAGNOSIS — E039 Hypothyroidism, unspecified: Secondary | ICD-10-CM | POA: Insufficient documentation

## 2020-08-08 DIAGNOSIS — Z7901 Long term (current) use of anticoagulants: Secondary | ICD-10-CM | POA: Insufficient documentation

## 2020-08-08 DIAGNOSIS — I509 Heart failure, unspecified: Secondary | ICD-10-CM | POA: Insufficient documentation

## 2020-08-08 DIAGNOSIS — Z45010 Encounter for checking and testing of cardiac pacemaker pulse generator [battery]: Secondary | ICD-10-CM | POA: Diagnosis not present

## 2020-08-08 DIAGNOSIS — I110 Hypertensive heart disease with heart failure: Secondary | ICD-10-CM | POA: Diagnosis not present

## 2020-08-08 DIAGNOSIS — Z79890 Hormone replacement therapy (postmenopausal): Secondary | ICD-10-CM | POA: Diagnosis not present

## 2020-08-08 DIAGNOSIS — Z952 Presence of prosthetic heart valve: Secondary | ICD-10-CM | POA: Diagnosis not present

## 2020-08-08 DIAGNOSIS — Z9981 Dependence on supplemental oxygen: Secondary | ICD-10-CM | POA: Insufficient documentation

## 2020-08-08 DIAGNOSIS — G4733 Obstructive sleep apnea (adult) (pediatric): Secondary | ICD-10-CM | POA: Diagnosis not present

## 2020-08-08 DIAGNOSIS — E785 Hyperlipidemia, unspecified: Secondary | ICD-10-CM | POA: Insufficient documentation

## 2020-08-08 DIAGNOSIS — I4821 Permanent atrial fibrillation: Secondary | ICD-10-CM | POA: Diagnosis not present

## 2020-08-08 HISTORY — PX: PPM GENERATOR CHANGEOUT: EP1233

## 2020-08-08 SURGERY — PPM GENERATOR CHANGEOUT
Anesthesia: LOCAL

## 2020-08-08 MED ORDER — FENTANYL CITRATE (PF) 100 MCG/2ML IJ SOLN
INTRAMUSCULAR | Status: DC | PRN
  Administered 2020-08-08: 25 ug via INTRAVENOUS

## 2020-08-08 MED ORDER — SODIUM CHLORIDE 0.9 % IV SOLN: 80.0000 mg | INTRAVENOUS | Status: DC

## 2020-08-08 MED ORDER — LIDOCAINE HCL (PF) 1 % IJ SOLN
INTRAMUSCULAR | Status: DC | PRN
  Administered 2020-08-08: 45 mL

## 2020-08-08 MED ORDER — CEFAZOLIN SODIUM-DEXTROSE 2-4 GM/100ML-% IV SOLN
INTRAVENOUS | Status: AC
  Filled 2020-08-08: qty 100

## 2020-08-08 MED ORDER — MIDAZOLAM HCL 5 MG/5ML IJ SOLN
INTRAMUSCULAR | Status: DC | PRN
  Administered 2020-08-08 (×2): 1 mg via INTRAVENOUS

## 2020-08-08 MED ORDER — POVIDONE-IODINE 10 % EX SWAB
2.0000 "application " | Freq: Once | CUTANEOUS | Status: AC
  Administered 2020-08-08: 2 via TOPICAL

## 2020-08-08 MED ORDER — CHLORHEXIDINE GLUCONATE 4 % EX LIQD: 4.0000 "application " | Freq: Once | CUTANEOUS | Status: DC

## 2020-08-08 MED ORDER — SODIUM CHLORIDE 0.9 % IV SOLN
INTRAVENOUS | Status: AC
  Filled 2020-08-08: qty 2

## 2020-08-08 MED ORDER — ONDANSETRON HCL 4 MG/2ML IJ SOLN: 4.0000 mg | Freq: Four times a day (QID) | INTRAMUSCULAR | Status: DC | PRN

## 2020-08-08 MED ORDER — FENTANYL CITRATE (PF) 100 MCG/2ML IJ SOLN
INTRAMUSCULAR | Status: AC
  Filled 2020-08-08: qty 2

## 2020-08-08 MED ORDER — SODIUM CHLORIDE 0.9 % IV SOLN: INTRAVENOUS | Status: DC

## 2020-08-08 MED ORDER — LIDOCAINE HCL (PF) 1 % IJ SOLN
INTRAMUSCULAR | Status: AC
  Filled 2020-08-08: qty 30

## 2020-08-08 MED ORDER — MIDAZOLAM HCL 5 MG/5ML IJ SOLN
INTRAMUSCULAR | Status: AC
  Filled 2020-08-08: qty 5

## 2020-08-08 MED ORDER — CEFAZOLIN SODIUM-DEXTROSE 2-4 GM/100ML-% IV SOLN
2.0000 g | INTRAVENOUS | Status: AC
  Administered 2020-08-08: 2 g via INTRAVENOUS

## 2020-08-08 MED ORDER — SODIUM CHLORIDE 0.9 % IV SOLN: 250.0000 mL | INTRAVENOUS | Status: DC | PRN

## 2020-08-08 MED ORDER — SODIUM CHLORIDE 0.9% FLUSH: 3.0000 mL | INTRAVENOUS | Status: DC | PRN

## 2020-08-08 MED ORDER — ACETAMINOPHEN 325 MG PO TABS
325.0000 mg | ORAL_TABLET | ORAL | Status: DC | PRN
  Filled 2020-08-08: qty 2

## 2020-08-08 MED ORDER — SODIUM CHLORIDE 0.9% FLUSH: 3.0000 mL | Freq: Two times a day (BID) | INTRAVENOUS | Status: DC

## 2020-08-08 SURGICAL SUPPLY — 4 items
CABLE SURGICAL S-101-97-12 (CABLE) ×2 IMPLANT
PACEMAKER ASSURITY DR-RF (Pacemaker) ×2 IMPLANT
PAD PRO RADIOLUCENT 2001M-C (PAD) ×2 IMPLANT
TRAY PACEMAKER INSERTION (PACKS) ×2 IMPLANT

## 2020-08-08 NOTE — Discharge Instructions (Signed)
Resume eliquis on Saturday Am dose   Implantable Cardiac Device Battery Change, Care After  This sheet gives you information about how to care for yourself after your procedure. Your health care provider may also give you more specific instructions. If you have problems or questions, contact your health care provider. What can I expect after the procedure? After your procedure, it is common to have:  Pain or soreness at the site where the cardiac device was inserted.  Swelling at the site where the cardiac device was inserted.  You should received an information card for your new device in 4-8 weeks. Follow these instructions at home: Incision care   Keep the incision clean and dry. ? Do not take baths, swim, or use a hot tub until after your wound check.  ? Do not shower for at least 7 days, or as directed by your health care provider. ? Pat the area dry with a clean towel. Do not rub the area. This may cause bleeding.  Follow instructions from your health care provider about how to take care of your incision. Make sure you: ? Leave stitches (sutures), skin glue, or adhesive strips in place. These skin closures may need to stay in place for 2 weeks or longer. If adhesive strip edges start to loosen and curl up, you may trim the loose edges. Do not remove adhesive strips completely unless your health care provider tells you to do that.  Check your incision area every day for signs of infection. Check for: ? More redness, swelling, or pain. ? More fluid or blood. ? Warmth. ? Pus or a bad smell. Activity  Do not lift anything that is heavier than 10 lb (4.5 kg) until your health care provider says it is okay to do so.  For the first week, or as long as told by your health care provider: ? Avoid lifting your affected arm higher than your shoulder. ? After 1 week, Be gentle when you move your arms over your head. It is okay to raise your arm to comb your hair. ? Avoid strenuous  exercise.  Ask your health care provider when it is okay to: ? Resume your normal activities. ? Return to work or school. ? Resume sexual activity. Eating and drinking  Eat a heart-healthy diet. This should include plenty of fresh fruits and vegetables, whole grains, low-fat dairy products, and lean protein like chicken and fish.  Limit alcohol intake to no more than 1 drink a day for non-pregnant women and 2 drinks a day for men. One drink equals 12 oz of beer, 5 oz of wine, or 1 oz of hard liquor.  Check ingredients and nutrition facts on packaged foods and beverages. Avoid the following types of food: ? Food that is high in salt (sodium). ? Food that is high in saturated fat, like full-fat dairy or red meat. ? Food that is high in trans fat, like fried food. ? Food and drinks that are high in sugar. Lifestyle  Do not use any products that contain nicotine or tobacco, such as cigarettes and e-cigarettes. If you need help quitting, ask your health care provider.  Take steps to manage and control your weight.  Once cleared, get regular exercise. Aim for 150 minutes of moderate-intensity exercise (such as walking or yoga) or 75 minutes of vigorous exercise (such as running or swimming) each week.  Manage other health problems, such as diabetes or high blood pressure. Ask your health care provider how you  can manage these conditions. General instructions  Do not drive for 24 hours after your procedure if you were given a medicine to help you relax (sedative).  Take over-the-counter and prescription medicines only as told by your health care provider.  Avoid putting pressure on the area where the cardiac device was placed.  If you need an MRI after your cardiac device has been placed, be sure to tell the health care provider who orders the MRI that you have a cardiac device.  Avoid close and prolonged exposure to electrical devices that have strong magnetic fields. These  include: ? Cell phones. Avoid keeping them in a pocket near the cardiac device, and try using the ear opposite the cardiac device. ? MP3 players. ? Household appliances, like microwaves. ? Metal detectors. ? Electric generators. ? High-tension wires.  Keep all follow-up visits as directed by your health care provider. This is important. Contact a health care provider if:  You have pain at the incision site that is not relieved by over-the-counter or prescription medicines.  You have any of these around your incision site or coming from it: ? More redness, swelling, or pain. ? Fluid or blood. ? Warmth to the touch. ? Pus or a bad smell.  You have a fever.  You feel brief, occasional palpitations, light-headedness, or any symptoms that you think might be related to your heart. Get help right away if:  You experience chest pain that is different from the pain at the cardiac device site.  You develop a red streak that extends above or below the incision site.  You experience shortness of breath.  You have palpitations or an irregular heartbeat.  You have light-headedness that does not go away quickly.  You faint or have dizzy spells.  Your pulse suddenly drops or increases rapidly and does not return to normal.  You begin to gain weight and your legs and ankles swell. Summary  After your procedure, it is common to have pain, soreness, and some swelling where the cardiac device was inserted.  Make sure to keep your incision clean and dry. Follow instructions from your health care provider about how to take care of your incision.  Check your incision every day for signs of infection, such as more pain or swelling, pus or a bad smell, warmth, or leaking fluid and blood.  Avoid strenuous exercise and lifting your left arm higher than your shoulder for 2 weeks, or as long as told by your health care provider. This information is not intended to replace advice given to you by  your health care provider. Make sure you discuss any questions you have with your health care provider.

## 2020-08-08 NOTE — Interval H&P Note (Signed)
History and Physical Interval Note:  08/08/2020 1:33 PM  [name]  has presented today for surgery, with the diagnosis of pacer eri.  The various methods of treatment have been discussed with the patient and family. After consideration of risks, benefits and other options for treatment, the patient has consented to  Procedure(s): PPM GENERATOR CHANGEOUT (N/A) as a surgical intervention.  The patient's history has been reviewed, patient examined, no change in status, stable for surgery.  I have reviewed the patient's chart and labs.  Questions were answered to the patient's satisfaction.     Risks, benefits, and alternatives to PPM pulse generator replacement were discussed in detail today.  The patient understands that risks include but are not limited to bleeding, infection, pneumothorax, perforation, tamponade, vascular damage, renal failure, MI, stroke, death, damage to his existing leads, and lead dislodgement and wishes to proceed.  We will therefore schedule the procedure at the next available time.   Thompson Grayer

## 2020-08-08 NOTE — Progress Notes (Signed)
Pt ambulated without difficulty or bleeding.   Pt complaining of his legs hurting.  Dr Rayann Heman made aware.  Pt to follow up with primary.  Discharged home with wife who will drive and stay with pt x 24 hrs

## 2020-08-09 ENCOUNTER — Encounter (HOSPITAL_COMMUNITY): Payer: Self-pay

## 2020-08-09 ENCOUNTER — Emergency Department (HOSPITAL_COMMUNITY)
Admission: EM | Admit: 2020-08-09 | Discharge: 2020-08-09 | Disposition: A | Discharge status: Home / Self Care | Payer: Medicare Other | Attending: Emergency Medicine | Admitting: Emergency Medicine

## 2020-08-09 ENCOUNTER — Emergency Department (HOSPITAL_COMMUNITY): Payer: Medicare Other

## 2020-08-09 DIAGNOSIS — Z79899 Other long term (current) drug therapy: Secondary | ICD-10-CM | POA: Diagnosis not present

## 2020-08-09 DIAGNOSIS — J449 Chronic obstructive pulmonary disease, unspecified: Secondary | ICD-10-CM | POA: Insufficient documentation

## 2020-08-09 DIAGNOSIS — Z87891 Personal history of nicotine dependence: Secondary | ICD-10-CM | POA: Insufficient documentation

## 2020-08-09 DIAGNOSIS — I5033 Acute on chronic diastolic (congestive) heart failure: Secondary | ICD-10-CM | POA: Insufficient documentation

## 2020-08-09 DIAGNOSIS — I4891 Unspecified atrial fibrillation: Secondary | ICD-10-CM | POA: Diagnosis not present

## 2020-08-09 DIAGNOSIS — E039 Hypothyroidism, unspecified: Secondary | ICD-10-CM | POA: Diagnosis not present

## 2020-08-09 DIAGNOSIS — I2510 Atherosclerotic heart disease of native coronary artery without angina pectoris: Secondary | ICD-10-CM | POA: Insufficient documentation

## 2020-08-09 DIAGNOSIS — Z950 Presence of cardiac pacemaker: Secondary | ICD-10-CM | POA: Insufficient documentation

## 2020-08-09 DIAGNOSIS — R112 Nausea with vomiting, unspecified: Secondary | ICD-10-CM | POA: Diagnosis not present

## 2020-08-09 DIAGNOSIS — N1832 Chronic kidney disease, stage 3b: Secondary | ICD-10-CM | POA: Insufficient documentation

## 2020-08-09 DIAGNOSIS — I130 Hypertensive heart and chronic kidney disease with heart failure and stage 1 through stage 4 chronic kidney disease, or unspecified chronic kidney disease: Secondary | ICD-10-CM | POA: Diagnosis not present

## 2020-08-09 DIAGNOSIS — M79604 Pain in right leg: Secondary | ICD-10-CM | POA: Diagnosis not present

## 2020-08-09 DIAGNOSIS — R109 Unspecified abdominal pain: Secondary | ICD-10-CM | POA: Diagnosis not present

## 2020-08-09 DIAGNOSIS — R0602 Shortness of breath: Secondary | ICD-10-CM | POA: Diagnosis not present

## 2020-08-09 DIAGNOSIS — R1084 Generalized abdominal pain: Secondary | ICD-10-CM | POA: Diagnosis not present

## 2020-08-09 DIAGNOSIS — J45909 Unspecified asthma, uncomplicated: Secondary | ICD-10-CM | POA: Diagnosis not present

## 2020-08-09 DIAGNOSIS — E1122 Type 2 diabetes mellitus with diabetic chronic kidney disease: Secondary | ICD-10-CM | POA: Diagnosis not present

## 2020-08-09 DIAGNOSIS — Z7901 Long term (current) use of anticoagulants: Secondary | ICD-10-CM | POA: Diagnosis not present

## 2020-08-09 DIAGNOSIS — R0600 Dyspnea, unspecified: Secondary | ICD-10-CM | POA: Diagnosis not present

## 2020-08-09 DIAGNOSIS — R110 Nausea: Secondary | ICD-10-CM

## 2020-08-09 LAB — BASIC METABOLIC PANEL
Anion gap: 12 (ref 5–15)
BUN: 23 mg/dL (ref 8–23)
CO2: 24 mmol/L (ref 22–32)
Calcium: 9.5 mg/dL (ref 8.9–10.3)
Chloride: 103 mmol/L (ref 98–111)
Creatinine, Ser: 1.56 mg/dL — ABNORMAL HIGH (ref 0.61–1.24)
GFR, Estimated: 47 mL/min — ABNORMAL LOW (ref >60)
Glucose, Bld: 110 mg/dL — ABNORMAL HIGH (ref 70–99)
Potassium: 4.3 mmol/L (ref 3.5–5.1)
Sodium: 139 mmol/L (ref 135–145)

## 2020-08-09 LAB — CBC
HCT: 44.1 % (ref 39.0–52.0)
Hemoglobin: 14 g/dL (ref 13.0–17.0)
MCH: 30.4 pg (ref 26.0–34.0)
MCHC: 31.7 g/dL (ref 30.0–36.0)
MCV: 95.9 fL (ref 80.0–100.0)
Platelets: 158 10*3/uL (ref 150–400)
RBC: 4.6 MIL/uL (ref 4.22–5.81)
RDW: 15.5 % (ref 11.5–15.5)
WBC: 10.2 10*3/uL (ref 4.0–10.5)
nRBC: 0 % (ref 0.0–0.2)

## 2020-08-09 LAB — TROPONIN I (HIGH SENSITIVITY)
Troponin I (High Sensitivity): 18 ng/L — ABNORMAL HIGH (ref <18)
Troponin I (High Sensitivity): 15 ng/L (ref <18)

## 2020-08-09 LAB — LACTIC ACID, PLASMA: Lactic Acid, Venous: 1.5 mmol/L (ref 0.5–1.9)

## 2020-08-09 LAB — LIPASE, BLOOD: Lipase: 28 U/L (ref 11–51)

## 2020-08-09 LAB — BRAIN NATRIURETIC PEPTIDE: B Natriuretic Peptide: 200.6 pg/mL — ABNORMAL HIGH (ref 0.0–100.0)

## 2020-08-09 MED ORDER — LORAZEPAM 1 MG PO TABS: 1.0000 mg | ORAL_TABLET | Freq: Three times a day (TID) | ORAL | 0 refills | Status: DC | PRN

## 2020-08-09 MED ORDER — IOHEXOL 300 MG/ML  SOLN
100.0000 mL | Freq: Once | INTRAMUSCULAR | Status: AC | PRN
  Administered 2020-08-09: 100 mL via INTRAVENOUS

## 2020-08-09 MED ORDER — SODIUM CHLORIDE 0.9 % IV BOLUS
500.0000 mL | Freq: Once | INTRAVENOUS | Status: AC
  Administered 2020-08-09: 500 mL via INTRAVENOUS

## 2020-08-09 MED ORDER — GABAPENTIN 300 MG PO CAPS
600.0000 mg | ORAL_CAPSULE | Freq: Once | ORAL | Status: AC
  Administered 2020-08-09: 600 mg via ORAL
  Filled 2020-08-09: qty 2

## 2020-08-09 MED ORDER — LORAZEPAM 2 MG/ML IJ SOLN
1.0000 mg | Freq: Once | INTRAMUSCULAR | Status: AC
  Administered 2020-08-09: 1 mg via INTRAVENOUS
  Filled 2020-08-09: qty 1

## 2020-08-09 MED ORDER — ONDANSETRON HCL 4 MG/2ML IJ SOLN
4.0000 mg | Freq: Once | INTRAMUSCULAR | Status: AC
  Administered 2020-08-09: 4 mg via INTRAVENOUS
  Filled 2020-08-09: qty 2

## 2020-08-09 MED ORDER — LORAZEPAM 2 MG/ML IJ SOLN
0.5000 mg | Freq: Once | INTRAMUSCULAR | Status: AC
  Administered 2020-08-09: 0.5 mg via INTRAVENOUS
  Filled 2020-08-09: qty 1

## 2020-08-09 MED FILL — Gentamicin Sulfate Inj 40 MG/ML: INTRAMUSCULAR | Qty: 80 | Status: AC

## 2020-08-09 NOTE — ED Triage Notes (Signed)
Pt is here today due to sob. Pt had pacemaker replace ( st jud. ) Pt reports it was replaced yesterday and since then he has had some sob. Pt on 02 at baseline. Pt reports he feels sick.

## 2020-08-09 NOTE — ED Triage Notes (Signed)
Emergency Medicine Provider Triage Evaluation Note  [name] , a 70 y.o. male  was evaluated in triage.  Pt complains of shortness of breath.  The patient reports that he has pacemaker replaced yesterday with Dr. Rayann Heman.  Since that time, he reports feeling poorly with shortness of breath.  No chest pain.  He has been coughing and states that he has been unable to eat and drink because he has been spitting up.  He denies vomiting.  He has chronic respiratory failure on home oxygen.  He is wheezing, but states that this is his baseline.  Review of Systems  Positive: Shortness of breath, cough, malaise Negative:   Physical Exam  BP 126/72 (BP Location: Left Arm)   Pulse 63   Temp 97.8 F (36.6 C) (Oral)   Resp (!) 22   SpO2 100%  Gen:   Awake, no distress, chronically ill-appearing HEENT:  Atraumatic Resp:  Tachypneic, nasal cannula in place, breath sounds are diminished with wheezes bilaterally Cardiac:  Normal rate Abd:   Nondistended, nontender MSK:   Moves extremities without difficulty  Neuro:  Speech clear   Medical Decision Making  Medically screening exam initiated at 6:09 AM.  Appropriate orders placed.  [name] was informed that the remainder of the evaluation will be completed by another provider, this initial triage assessment does not replace that evaluation, and the importance of remaining in the ED until their evaluation is complete.  Clinical Impression  Patient presenting with shortness of breath, malaise, cough that significantly worsened after he had his pacemaker replaced yesterday.  No chest pain.  Labs, imaging, and EKG have been initiated in the emergency department.  Patient will require further work-up and evaluation.   Joline Maxcy A, PA-C 08/09/20 [phone]

## 2020-08-09 NOTE — ED Notes (Signed)
Reviewed discharge instructions with patient and spouse. Follow-up care and medications reviewed. Patient and spouse verbalized understanding. Patient A&Ox4, VSS, and ambulatory with steady gait upon discharge.  °

## 2020-08-09 NOTE — ED Provider Notes (Signed)
[facility] EMERGENCY DEPARTMENT Provider Note   CSN: [id number] Arrival date & time: 08/09/20  0551     History Chief Complaint  Patient presents with  . Shortness of Breath    [name] is a 70 y.o. male.  HPI Patient presents with dyspnea, nausea, vomiting, right leg pain. Patient has multiple medical issues including CHF, A. fib.  Yesterday the patient had a pacemaker placed.  Reportedly this was uncomplicated.  He notes that since that procedure, however, he has had persistent and worsening dyspnea as well as new anorexia, diffuse abdominal discomfort.  He has had p.o. intolerance, with nausea, vomiting.  No diarrhea.  No fever.  No relief with anything, though he has been unable to take some of his medication secondary to discomfort.    Past Medical History:  Diagnosis Date  . Aortic stenosis, mild   . Arthritis of hand    "just a little bit in both hands" (03/31/2012)  . Asthma    "little bit" (03/31/2012)  . Atrial fibrillation [facility])    dx '04; DCCV '04, placed on flecainide, failed DCCV 04/2010, flecainide stopped; s/p successful A.fib ablation 01/31/12  . CHF (congestive heart failure) ([place])   . Controlled type 2 diabetes mellitus without complication, without long-term current use of insulin ([place]) 12/13/2019  . COPD (chronic obstructive pulmonary disease) ([place])   . DJD (degenerative joint disease)   . Fibromyalgia   . GERD (gastroesophageal reflux disease)   . Hyperlipidemia   . Hypertension   . Hypothyroidism    S/P radiation  . Left atrial enlargement    LA size 61mm by echo 11/21/11  . Mitral regurgitation    trivial  . Obstructive sleep apnea    mild by sleep study 2013; pt stated he does not have a machine because "it wasn't bad enough for him to have one"  . Pacemaker 03/31/2012  . Restless leg syndrome   . Second degree AV block   . Splenomegaly   . Visit for monitoring Tikosyn therapy 04/16/2016    Patient Active Problem List    Diagnosis Date Noted  . Pre-operative respiratory examination 03/27/2020  . Acute on chronic diastolic (congestive) heart failure ([place]) 01/22/2020  . Abdominal pain 01/22/2020  . Hypothyroidism 12/13/2019  . Controlled type 2 diabetes mellitus with stage 3 chronic kidney disease, without long-term current use of insulin ([place]) 12/13/2019  . Chronic kidney disease, stage 3b ([place]) 12/13/2019  . Acute bacterial bronchitis 12/13/2019  . COPD exacerbation ([place]) 12/13/2019  . COPD with acute exacerbation ([place]) 12/12/2019  . COPD mixed type ([place]) 10/16/2019  . Typical atrial flutter ([place])   . Chronic respiratory failure with hypoxia ([place]) 05/23/2016  . CAD (coronary artery disease) 05/23/2016  . Persistent atrial fibrillation ([place])   . Atypical atrial flutter ([place])   . Visit for monitoring Tikosyn therapy 04/16/2016  . Chronic diastolic CHF (congestive heart failure) ([place]) 02/03/2016  . Cough 12/06/2015  . Hallucinations 09/04/2015  . Lymphadenopathy 09/04/2015  . Ascending aortic aneurysm ([place]) 09/04/2015  . Hypoxia 09/02/2015  . S/P aortic valve replacement with bioprosthetic valve 09/02/2015  . Pleural effusion 09/02/2015  . S/P AVR 08/22/2015  . GERD without esophagitis 09/20/2014  . 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 Dyspnea 09/15/2013  . Pulmonary hypertension ([place]) 09/15/2013  . Shortness of breath 05/05/2013  . 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 Bradycardia 03/31/2012  . Second degree AV block 03/31/2012  . AV block, 1st degree 02/01/2012  . PAF (paroxysmal atrial fibrillation) (  [place]) 11/13/2011  . Fatigue 11/13/2011  . Hypertension 11/13/2011  . Aortic stenosis 11/13/2011  . Sleep apnea 11/13/2011    Past Surgical History:  Procedure Laterality Date  . AORTIC VALVE REPLACEMENT N/A 08/22/2015   Procedure: AORTIC VALVE REPLACEMENT (AVR);  Surgeon: Ivin Poot, MD;  Location: [place];  Service: Open Heart Surgery;  Laterality: N/A;  . ATRIAL FIBRILLATION ABLATION  01/30/2012    PVI by Dr. Rayann Heman  . ATRIAL FIBRILLATION ABLATION N/A 01/31/2012   Procedure: ATRIAL FIBRILLATION ABLATION;  Surgeon: Thompson Grayer, MD;  Location: [facility] CATH LAB;  Service: Cardiovascular;  Laterality: N/A;  . ATRIAL FIBRILLATION ABLATION N/A 04/19/2020   Procedure: ATRIAL FIBRILLATION ABLATION;  Surgeon: Thompson Grayer, MD;  Location: [place] CV LAB;  Service: Cardiovascular;  Laterality: N/A;  . BACK SURGERY     X 3  . CARDIAC CATHETERIZATION N/A 08/09/2015   Procedure: Right/Left Heart Cath and Coronary Angiography;  Surgeon: Peter M Martinique, MD;  Location: [place] CV LAB;  Service: Cardiovascular;  Laterality: N/A;  . CARDIAC CATHETERIZATION N/A 02/05/2016   Procedure: Right/Left Heart Cath and Coronary Angiography;  Surgeon: Jettie Booze, MD;  Location: [place] CV LAB;  Service: Cardiovascular;  Laterality: N/A;  . CARDIAC CATHETERIZATION N/A 04/17/2016   Procedure: Right Heart Cath;  Surgeon: Jolaine Artist, MD;  Location: [place] CV LAB;  Service: Cardiovascular;  Laterality: N/A;  . CARDIOVASCULAR STRESS TEST  03/12/2002   EF 48%, NO EVIDENCE OF ISCHEMIA  . CARDIOVERSION  01/2012; 03/31/2012  . CARDIOVERSION N/A 02/25/2012   Procedure: CARDIOVERSION;  Surgeon: Thompson Grayer, MD;  Location: [facility] CATH LAB;  Service: Cardiovascular;  Laterality: N/A;  . CARDIOVERSION N/A 03/31/2012   Procedure: CARDIOVERSION;  Surgeon: Thompson Grayer, MD;  Location: [facility] CATH LAB;  Service: Cardiovascular;  Laterality: N/A;  . CARDIOVERSION N/A 11/23/2015   Procedure: CARDIOVERSION;  Surgeon: Larey Dresser, MD;  Location: [place];  Service: Cardiovascular;  Laterality: N/A;  . CARDIOVERSION N/A 04/08/2016   Procedure: CARDIOVERSION;  Surgeon: Jolaine Artist, MD;  Location: [place];  Service: Cardiovascular;  Laterality: N/A;  . CARDIOVERSION N/A 09/14/2018   Procedure: CARDIOVERSION;  Surgeon: Josue Hector, MD;  Location: [place];  Service: Cardiovascular;  Laterality:  N/A;  . CARDIOVERSION N/A 10/19/2019   Procedure: CARDIOVERSION;  Surgeon: Jolaine Artist, MD;  Location: [facility] ENDOSCOPY;  Service: Cardiovascular;  Laterality: N/A;  . CARDIOVERSION N/A 02/25/2020   Procedure: CARDIOVERSION;  Surgeon: Fay Records, MD;  Location: [place];  Service: Cardiovascular;  Laterality: N/A;  . CARDIOVERSION N/A 05/25/2020   Procedure: CARDIOVERSION;  Surgeon: Lelon Perla, MD;  Location: [facility] ENDOSCOPY;  Service: Cardiovascular;  Laterality: N/A;  . CARDIOVERSION N/A 07/10/2020   Procedure: CARDIOVERSION;  Surgeon: Jerline Pain, MD;  Location: [place];  Service: Cardiovascular;  Laterality: N/A;  . CLIPPING OF ATRIAL APPENDAGE N/A 08/22/2015   Procedure: CLIPPING OF ATRIAL APPENDAGE;  Surgeon: Ivin Poot, MD;  Location: [place];  Service: Open Heart Surgery;  Laterality: N/A;  . DOPPLER ECHOCARDIOGRAPHY  03/11/2002   EF 70-75%  . FINGER SURGERY Left    Middle finger  . FINGER TENDON REPAIR  1980's   "right little finger" (03/31/2012)  . INSERT / REPLACE / REMOVE PACEMAKER     St Jude  . IR RADIOLOGIST EVAL & MGMT  05/06/2019  . IR RADIOLOGIST EVAL & MGMT  10/06/2019  . LEFT AND RIGHT HEART CATHETERIZATION WITH CORONARY ANGIOGRAM N/A 10/27/2012  Procedure: LEFT AND RIGHT HEART CATHETERIZATION WITH CORONARY ANGIOGRAM;  Surgeon: Laverda Page, MD;  Location[facility] CATH LAB;  Service: Cardiovascular;  Laterality: N/A;  . LUMBAR DISC SURGERY  1980's X2;  2000's  . MAZE N/A 08/22/2015   Procedure: MAZE;  Surgeon: Ivin Poot, MD;  Location: [place];  Service: Open Heart Surgery;  Laterality: N/A;  . PACEMAKER INSERTION  03/31/2012   STJ Accent DR pacemaker implanted by Dr Rayann Heman  . PERMANENT PACEMAKER INSERTION N/A 03/31/2012   Procedure: PERMANENT PACEMAKER INSERTION;  Surgeon: Thompson Grayer, MD;  Location: [facility] CATH LAB;  Service: Cardiovascular;  Laterality: N/A;  . PPM GENERATOR CHANGEOUT N/A 08/08/2020   Procedure: PPM GENERATOR CHANGEOUT;  Surgeon:  Thompson Grayer, MD;  Location: [place] CV LAB;  Service: Cardiovascular;  Laterality: N/A;  . TEE WITHOUT CARDIOVERSION  01/30/2012   Procedure: TRANSESOPHAGEAL ECHOCARDIOGRAM (TEE);  Surgeon: Larey Dresser, MD;  Location: [place];  Service: Cardiovascular;  Laterality: N/A;  ablation next day  . TEE WITHOUT CARDIOVERSION N/A 08/22/2015   Procedure: TRANSESOPHAGEAL ECHOCARDIOGRAM (TEE);  Surgeon: Ivin Poot, MD;  Location: [place];  Service: Open Heart Surgery;  Laterality: N/A;  . TEE WITHOUT CARDIOVERSION N/A 10/19/2019   Procedure: TRANSESOPHAGEAL ECHOCARDIOGRAM (TEE);  Surgeon: Jolaine Artist, MD;  Location: [facility] ENDOSCOPY;  Service: Cardiovascular;  Laterality: N/A;  . US ECHOCARDIOGRAPHY  08/07/2009   EF 55-60%       Family History  Problem Relation Age of Onset  . Heart failure Mother   . Stroke Father     Social History   Tobacco Use  . Smoking status: Former Smoker    Packs/day: 1.00    Years: 25.00    Pack years: 25.00    Types: Cigarettes    Quit date: 07/18/1991    Years since quitting: 29.0  . Smokeless tobacco: Never Used  Vaping Use  . Vaping Use: Never used  Substance Use Topics  . Alcohol use: Not Currently    Comment: occasional  . Drug use: Yes    Types: Marijuana    Comment: rarely use    Home Medications Prior to Admission medications   Medication Sig Start Date End Date Taking? Authorizing Provider  albuterol (PROVENTIL HFA;VENTOLIN HFA) 108 (90 Base) MCG/ACT inhaler Inhale 2 puffs into the lungs every 6 (six) hours as needed for wheezing or shortness of breath. 12/28/15   Collene Gobble, MD  amLODipine (NORVASC) 10 MG tablet Take 0.5 tablets (5 mg total) by mouth daily. 09/18/15   Nahser, Wonda Cheng, MD  apixaban (ELIQUIS) 5 MG TABS tablet Take 1 tablet (5 mg total) by mouth 2 (two) times daily. 07/06/20   Bensimhon, Shaune Pascal, MD  bisoprolol (ZEBETA) 5 MG tablet Take 5 mg by mouth daily.    [provider]   Budeson-Glycopyrrol-Formoterol (BREZTRI AEROSPHERE) 160-9-4.8 MCG/ACT AERO Inhale 2 puffs into the lungs in the morning and at bedtime. 03/24/20   Martyn Ehrich, NP  Calcium-Magnesium-Zinc (CAL-MAG-ZINC PO) Take 1 tablet by mouth daily.    [provider]  Cholecalciferol (VITAMIN D) 2000 UNITS CAPS Take 8,000 Units by mouth at bedtime.     [provider]  empagliflozin (JARDIANCE) 10 MG TABS tablet Take 1 tablet (10 mg total) by mouth daily before breakfast. 03/14/20   Bensimhon, Shaune Pascal, MD  fexofenadine (ALLEGRA) 180 MG tablet Take 1 tablet (180 mg total) by mouth daily as needed for allergies or rhinitis. 01/23/20   Aline August, MD  gabapentin (NEURONTIN) 600 MG tablet Take 600-1,200 mg by mouth See admin instructions. Take 600 mg by mouth in the morning, 600 mg at lunchtime, and take 1200 mg at bedtime 09/13/14   [provider]  guaiFENesin (MUCINEX PO) Take 800 mg by mouth 2 (two) times daily. 400 mg each    [provider]  levothyroxine (SYNTHROID, LEVOTHROID) 175 MCG tablet Take 175 mcg by mouth daily before breakfast.    [provider]  montelukast (SINGULAIR) 10 MG tablet Take 1 tablet (10 mg total) by mouth at bedtime. 07/19/20   Martyn Ehrich, NP  Multiple Vitamin (MULTIVITAMIN WITH MINERALS) TABS tablet Take 1 tablet by mouth daily. 12/15/19   Sheikh, Georgina Quint Latif, DO  ondansetron (ZOFRAN-ODT) 4 MG disintegrating tablet Take 4 mg by mouth every 8 (eight) hours as needed for nausea or vomiting.    [provider]  oxyCODONE-acetaminophen (PERCOCET/ROXICET) 5-325 MG tablet Take 1 tablet by mouth every 12 (twelve) hours.    [provider]  OXYGEN Inhale 2 L into the lungs continuous.    [provider]  pantoprazole (PROTONIX) 40 MG tablet Take 40 mg by mouth daily.    [provider]  polyethylene glycol powder (GLYCOLAX/MIRALAX) 17 GM/SCOOP powder Take 17 g by mouth daily as needed for mild  constipation. 01/23/20   Aline August, MD  Polyvinyl Alcohol-Povidone (REFRESH OP) Place 1 drop into both eyes daily as needed (dry eyes).    [provider]  potassium chloride (KLOR-CON) 10 MEQ tablet Take 20 mEq by mouth See admin instructions. Take 20 meq twice every other day (take only on torsemide days)    [provider]  predniSONE (DELTASONE) 5 MG tablet Take 1 tablet (5 mg total) by mouth daily with breakfast. Patient taking differently: Take 2.5 mg by mouth daily with breakfast. 07/19/20   Martyn Ehrich, NP  protein supplement shake (PREMIER PROTEIN) LIQD Take 11 oz by mouth daily as needed (protein).    [provider]  rOPINIRole (REQUIP) 1 MG tablet Take 1 tablet (1 mg total) by mouth in the morning and at bedtime. 01/23/20   Aline August, MD  rosuvastatin (CRESTOR) 10 MG tablet Take 10 mg by mouth at bedtime.  08/08/16   [provider]  spironolactone (ALDACTONE) 25 MG tablet Take 1 tablet (25 mg total) by mouth daily. 02/16/16   Shirley Friar, PA-C  torsemide (DEMADEX) 20 MG tablet Take 40 mg by mouth every other day.    [provider]  traMADol-acetaminophen (ULTRACET) 37.5-325 MG tablet Take 2 tablets by mouth 2 (two) times daily.    [provider]    Allergies    Meloxicam and Vancomycin  Review of Systems   Review of Systems  Constitutional:       Per HPI, otherwise negative  HENT:       Per HPI, otherwise negative  Respiratory:       Per HPI, otherwise negative  Cardiovascular:       Per HPI, otherwise negative  Gastrointestinal: Positive for abdominal pain, nausea and vomiting.  Endocrine:       Negative aside from HPI  Genitourinary:       Neg aside from HPI   Musculoskeletal:       Per HPI, otherwise negative  Skin: Negative.   Neurological: Negative for syncope.       Neuropathy    Physical Exam Updated Vital Signs BP 132/80   Pulse 77   Temp 97.8  F (36.6 C) (Oral)   Resp 19    SpO2 100%   Physical Exam Vitals and nursing note reviewed.  Constitutional:      General: He is not in acute distress.    Appearance: He is well-developed.     Comments: Uncomfortable appearing elderly male awake and alert.  HENT:     Head: Normocephalic and atraumatic.  Eyes:     Conjunctiva/sclera: Conjunctivae normal.  Cardiovascular:     Rate and Rhythm: Normal rate and regular rhythm.  Pulmonary:     Effort: Pulmonary effort is normal. No respiratory distress.     Breath sounds: No stridor.  Chest:    Abdominal:     General: There is no distension.    Skin:    General: Skin is warm and dry.  Neurological:     Mental Status: He is alert and oriented to person, place, and time.     ED Results / Procedures / Treatments   Labs (all labs ordered are listed, but only abnormal results are displayed) Labs Reviewed  BASIC METABOLIC PANEL - Abnormal; Notable for the following components:      Result Value   Glucose, Bld 110 (*)    Creatinine, Ser 1.56 (*)    GFR, Estimated 47 (*)    All other components within normal limits  BRAIN NATRIURETIC PEPTIDE - Abnormal; Notable for the following components:   B Natriuretic Peptide 200.6 (*)    All other components within normal limits  TROPONIN I (HIGH SENSITIVITY) - Abnormal; Notable for the following components:   Troponin I (High Sensitivity) 18 (*)    All other components within normal limits  CBC  LACTIC ACID, PLASMA  LIPASE, BLOOD  TROPONIN I (HIGH SENSITIVITY)    EKG EKG Interpretation  Date/Time:  Wednesday August 09 2020 06:01:37 EDT Ventricular Rate:  60 PR Interval:    QRS Duration: 164 QT Interval:  468 QTC Calculation: 468 R Axis:   -77 Text Interpretation: Ventricular-paced rhythm Abnormal ECG Confirmed by Carmin Muskrat [id number]) on 08/09/2020 7:48:05 AM   Radiology DG Chest 2 View  Result Date: 08/09/2020 CLINICAL DATA:  Dyspnea EXAM: CHEST - 2 VIEW COMPARISON:  01/22/2020 FINDINGS: The lungs  are symmetrically well inflated. Prominence of the pulmonary interstitium is again noted and is unchanged, nonspecific. No superimposed focal pulmonary infiltrate. No pneumothorax or pleural effusion. Aortic valve replacement and left atrial clipping has been performed. Left subclavian dual lead pacemaker is unchanged. Cardiac size within normal limits. No acute bone abnormality. IMPRESSION: No active cardiopulmonary disease.  Unchanged examination. Electronically Signed   By: Fidela Salisbury MD   On: 08/09/2020 06:17   CT Abdomen Pelvis W Contrast  Result Date: 08/09/2020 CLINICAL DATA:  Lower abdominal pain EXAM: CT ABDOMEN AND PELVIS WITH CONTRAST TECHNIQUE: Multidetector CT imaging of the abdomen and pelvis was performed using the standard protocol following bolus administration of intravenous contrast. CONTRAST:  116mL OMNIPAQUE IOHEXOL 300 MG/ML  SOLN COMPARISON:  01/22/2020 FINDINGS: Lower chest: Mild emphysematous changes are noted. No focal infiltrate is seen. Hepatobiliary: Mild fatty infiltration of the liver is noted. The gallbladder is within normal limits. Previously seen gallstone is less well appreciated on today's exam. Pancreas: Unremarkable. No pancreatic ductal dilatation or surrounding inflammatory changes. Spleen: Normal in size without focal abnormality. Adrenals/Urinary Tract: Adrenal glands are within normal limits. Scarring in the right kidney is noted stable from the prior exam. Left kidney demonstrates normal enhancement and excretion. On delayed images there is a 58  mm focal area of decreased attenuation identified. This is best visualized on image number 10 of series 8. Normal excretion is noted from the kidneys bilaterally. Bladder is within normal limits. No renal calculi or obstructive changes seen. Stomach/Bowel: Colon shows no obstructive or inflammatory changes. The appendix is within normal limits. Small bowel and stomach are unremarkable. Vascular/Lymphatic: Aortic  atherosclerosis. No enlarged abdominal or pelvic lymph nodes. Reproductive: Prostate is unremarkable. Other: Fat containing left inguinal hernia is noted and stable. Small fat containing umbilical hernia is noted as well. Musculoskeletal: Degenerative changes of lumbar spine are noted. IMPRESSION: 17 mm area of decreased attenuation it in the left kidney better visualized than on the prior exam suspicious for underlying neoplasm. Patient is unable to undergo MRI testing due to a known pacemaker. Nonemergent renal CT protocol is recommended for optimum evaluation. This could not be performed today given the recent contrast dosage. Fatty liver. Stable hernias. Chronic scarring in the right kidney. Electronically Signed   By: Inez Catalina M.D.   On: 08/09/2020 09:05   EP PPM/ICD IMPLANT  Result Date: 08/08/2020 SURGEON:  Thompson Grayer, MD   PREPROCEDURE DIAGNOSES:  1. Permanent atrial fibrillation.  2. Complete heart block.  3. Elective Replacement Indicator Status   POSTPROCEDURE DIAGNOSES:  1. Permanent atrial fibrillation.  2. Complete heart block.  3. Elective Replacement Indicator Status   PROCEDURES:  1. Pacemaker pulse generator replacement.  2. Skin pocket revision.   INTRODUCTION:  SHAROD PETSCH is a 70 y.o. male with a history of complete heart block. The patient has done well since his pacemaker was implanted.  The patient  has recently reached ERI battery status.  The patient presents today for pacemaker pulse generator replacement.     DESCRIPTION OF THE PROCEDURE:  Informed written consent was obtained, and the patient was brought to the electrophysiology lab in the fasting state.  The patient's pacemaker was interrogated today and found to be at elective replacement indicator battery status.  The patient was adequately sedated with intravenous Versed and Fentanyl as as outlined in the nursing report.  The patient's left chest was prepped and draped in the usual sterile fashion by the EP lab staff.   The skin overlying the existing pacemaker was infiltrated with lidocaine for local analgesia.  A 4-cm incision was made over the pacemaker pocket.  Using a combination of sharp and blunt dissection, the pacemaker was exposed and removed from the body.  The device was disconnected from the leads.  There was no foreign matter or debris within the pocket.  The atrial lead was confirmed to be a [place] [id number] (serial number [id number]) lead implanted on 04/10/12.  The right ventricular lead was confirmed to be a  [place] [id number] (serial number [id number]) lead implanted on the same date as the atrial lead (above). Both leads were examined and their integrity was confirmed to be intact.  Atrial lead afib-waves measured 1.6 mV with impedance of 440 ohms.  Right ventricular lead R-waves could not be measured due to complete heart block with no escape.  The RV lead  impedance was 680 ohms with a threshold of 1.3 V at 0.5 msec. Both leads were connected to an [employer] Assurity MRI model [id number] (serial number [id number]) pacemaker.  The pocket was revised to accommodate this new device.  Electrocautery was required to assure hemostasis.  The pocket was irrigated with copious gentamicin solution. The pacemaker was then placed into the pocket.  The  pocket was then closed in 2 layers with 2-0 Vicryl suture over the subcutaneous and subcuticular layers.  Steri-Strips and a sterile dressing were then applied.EBL<52ml. There were no early apparent complications. Permanent Pacemaker Indication: Documented non-reversible symptomatic bradycardia due to second degree and/or third degree atrioventricular block.   CONCLUSIONS:  1. Successful pacemaker pulse generator replacement for elective replacement indicator battery status  2. No early apparent complications.   Thompson Grayer, MD 08/08/2020 2:38 PM   Procedures Procedures   Medications Ordered in ED Medications  sodium chloride 0.9 % bolus 500 mL (0 mLs  Intravenous Stopped 08/09/20 1108)  LORazepam (ATIVAN) injection 0.5 mg (0.5 mg Intravenous Given 08/09/20 0856)  iohexol (OMNIPAQUE) 300 MG/ML solution 100 mL (100 mLs Intravenous Contrast Given 08/09/20 0853)  gabapentin (NEURONTIN) capsule 600 mg (600 mg Oral Given 08/09/20 1246)  ondansetron (ZOFRAN) injection 4 mg (4 mg Intravenous Given 08/09/20 1248)  LORazepam (ATIVAN) injection 1 mg (1 mg Intravenous Given 08/09/20 1247)    ED Course  I have reviewed the triage vital signs and the nursing notes.  Pertinent labs & imaging results that were available during my care of the patient were reviewed by me and considered in my medical decision making (see chart for details).   On monitor the patient has paced rhythm, 60, abnormal Pulse ox 97% room air normal     With consideration of nausea, vomiting, after recent procedure, anesthesia, patient had broad differential including basement complication, bowel obstruction, infection, considered.  CT scan, x-ray, labs, fluids ordered.  Update: I discussed the patient's case with our cardiology colleagues given his recent pacemaker manipulation.   Patient has been seen and evaluated by cardiology, I reviewed the patient's CT scan, and now on repeat exam the patient is feeling better, having received benzodiazepine for anxiolysis, and antiemetic.   1:08 PM Remaining labs unremarkable.  Patient has improved.  We again discussed all findings, no evidence for bowel obstruction, no evidence for infection, and per cardiology, pacemaker is appropriate and functionality.  No insertion site evidence for infection either. Patient has preference for discharge, given his reassuring findings, patient is discharged in stable condition.  MDM Rules/Calculators/A&P MDM Number of Diagnoses or Management Options Generalized abdominal pain: new, needed workup Nausea: new, needed workup SOB (shortness of breath): new, needed workup   Amount and/or Complexity of  Data Reviewed Clinical lab tests: reviewed and ordered Tests in the radiology section of CPT: ordered and reviewed Tests in the medicine section of CPT: reviewed and ordered Decide to obtain previous medical records or to obtain history from someone other than the patient: yes Obtain history from someone other than the patient: yes Review and summarize past medical records: yes Discuss the patient with other providers: yes Independent visualization of images, tracings, or specimens: yes  Risk of Complications, Morbidity, and/or Mortality Presenting problems: high Diagnostic procedures: high Management options: high  Critical Care Total time providing critical care: < 30 minutes  Patient Progress Patient progress: stable  Final Clinical Impression(s) / ED Diagnoses Final diagnoses:  SOB (shortness of breath)  Generalized abdominal pain  Nausea    Rx / DC Orders ED Discharge Orders         Ordered    LORazepam (ATIVAN) 1 MG tablet  Every 8 hours PRN        08/09/20 1315           Carmin Muskrat, MD 08/09/20 1316

## 2020-08-09 NOTE — Progress Notes (Signed)
Pt came to the ER with some c/o SOB, nausea, loss of appetite, no reports to me of vomiting We were asked with pt/family concerns of post procedure complications The patient tells me that his primary complaint is terrible R leg pain This goes back 10 years unchanged occurs at rest and is better with ambulation\ He has excellent R foot pedal pulses, chronic looking skin changes He reports some nausea and sick to his stomach once home his wife thinks 2/2 being NPO He does not have belly pain or tenderness. He does not report to me any unusual SOB from his baseline  The patient had a PPM generator change yesterday His site looks great tegaderm was removed, steri-strips remain in place No bleeding, no hematoma, minimal ecchymosis  I re-discussed wound care instructions and not to resume Eliquis until Saturday as instructed yesterday Post generator change follow up is in place   I have discussed with Dr. Rayann Heman No new recommendations frm an EP, post procedural standpoint OK to discharge from an EP perspective when felt medically ready otherwise  Tommye Standard, PA-C

## 2020-08-09 NOTE — ED Notes (Signed)
Patient transported to CT 

## 2020-08-09 NOTE — ED Notes (Signed)
Pt and pt's wife educated on need to keep BP cuff and monitoring equipment on. Cuff noted to have been removed upon entry to room.

## 2020-08-09 NOTE — Discharge Instructions (Signed)
As discussed, your evaluation today has been largely reassuring.  But, it is important that you monitor your condition carefully, and do not hesitate to return to the ED if you develop new, or concerning changes in your condition. ? ?Otherwise, please follow-up with your physician for appropriate ongoing care. ? ?

## 2020-08-22 ENCOUNTER — Ambulatory Visit (INDEPENDENT_AMBULATORY_CARE_PROVIDER_SITE_OTHER): Payer: Medicare Other | Admitting: Emergency Medicine

## 2020-08-22 ENCOUNTER — Other Ambulatory Visit: Payer: Self-pay

## 2020-08-22 DIAGNOSIS — I440 Atrioventricular block, first degree: Secondary | ICD-10-CM | POA: Diagnosis not present

## 2020-08-22 LAB — CUP PACEART INCLINIC DEVICE CHECK
Battery Remaining Longevity: 136 mo
Battery Voltage: 3.14 V
Brady Statistic RA Percent Paced: 0 %
Brady Statistic RV Percent Paced: 99.77 %
Date Time Interrogation Session: 20220426112234
Implantable Lead Implant Date: 20131203
Implantable Lead Implant Date: 20131203
Implantable Lead Location: 753859
Implantable Lead Location: 753860
Implantable Lead Model: 1948
Implantable Pulse Generator Implant Date: 20220412
Lead Channel Impedance Value: 675 Ohm
Lead Channel Pacing Threshold Amplitude: 1.125 V
Lead Channel Pacing Threshold Pulse Width: 0.5 ms
Lead Channel Sensing Intrinsic Amplitude: 7.5 mV
Lead Channel Setting Pacing Amplitude: 1.375
Lead Channel Setting Pacing Pulse Width: 0.5 ms
Lead Channel Setting Sensing Sensitivity: 3.5 mV
Pulse Gen Model: 2272
Pulse Gen Serial Number: 3919039

## 2020-08-22 NOTE — Progress Notes (Signed)
Wound check appointment. Steri-strips removed. Wound without redness or edema. Incision edges approximated, wound well healed. Normal device function. Thresholds, sensing, and impedances consistent with implant measurements. Ventricular auto capture programmed on in chronic leads. R wave sensitivity measured 7.5 mv, lead sensitivity lowered from 4.0 to 3.5 to maintain 2:1 safety margin today.  Histogram distribution appropriate for patient and level of activity. No mode switches or high ventricular rates noted. Patient educated about wound care, arm mobility, lifting restrictions. Patient is enrolled in remote monitoring, next scheduled check 11/08/20.  ROV with Dr. Rayann Heman 11/22/20.

## 2020-08-30 DIAGNOSIS — E785 Hyperlipidemia, unspecified: Secondary | ICD-10-CM | POA: Diagnosis not present

## 2020-08-30 DIAGNOSIS — I4891 Unspecified atrial fibrillation: Secondary | ICD-10-CM | POA: Diagnosis not present

## 2020-08-30 DIAGNOSIS — I130 Hypertensive heart and chronic kidney disease with heart failure and stage 1 through stage 4 chronic kidney disease, or unspecified chronic kidney disease: Secondary | ICD-10-CM | POA: Diagnosis not present

## 2020-08-30 DIAGNOSIS — C649 Malignant neoplasm of unspecified kidney, except renal pelvis: Secondary | ICD-10-CM | POA: Diagnosis not present

## 2020-09-04 ENCOUNTER — Other Ambulatory Visit (HOSPITAL_COMMUNITY): Payer: Self-pay | Admitting: Internal Medicine

## 2020-09-20 DIAGNOSIS — J449 Chronic obstructive pulmonary disease, unspecified: Secondary | ICD-10-CM | POA: Diagnosis not present

## 2020-09-20 DIAGNOSIS — Z23 Encounter for immunization: Secondary | ICD-10-CM | POA: Diagnosis not present

## 2020-09-20 DIAGNOSIS — E876 Hypokalemia: Secondary | ICD-10-CM | POA: Diagnosis not present

## 2020-09-20 DIAGNOSIS — E1169 Type 2 diabetes mellitus with other specified complication: Secondary | ICD-10-CM | POA: Diagnosis not present

## 2020-09-20 DIAGNOSIS — N183 Chronic kidney disease, stage 3 unspecified: Secondary | ICD-10-CM | POA: Diagnosis not present

## 2020-09-20 DIAGNOSIS — I5042 Chronic combined systolic (congestive) and diastolic (congestive) heart failure: Secondary | ICD-10-CM | POA: Diagnosis not present

## 2020-09-20 DIAGNOSIS — R972 Elevated prostate specific antigen [PSA]: Secondary | ICD-10-CM | POA: Diagnosis not present

## 2020-09-20 DIAGNOSIS — I10 Essential (primary) hypertension: Secondary | ICD-10-CM | POA: Diagnosis not present

## 2020-09-20 DIAGNOSIS — I4891 Unspecified atrial fibrillation: Secondary | ICD-10-CM | POA: Diagnosis not present

## 2020-10-11 ENCOUNTER — Other Ambulatory Visit: Payer: Self-pay | Admitting: Interventional Radiology

## 2020-10-11 DIAGNOSIS — N2889 Other specified disorders of kidney and ureter: Secondary | ICD-10-CM

## 2020-10-12 DIAGNOSIS — N1832 Chronic kidney disease, stage 3b: Secondary | ICD-10-CM | POA: Diagnosis not present

## 2020-10-12 DIAGNOSIS — I509 Heart failure, unspecified: Secondary | ICD-10-CM | POA: Diagnosis not present

## 2020-10-12 DIAGNOSIS — I129 Hypertensive chronic kidney disease with stage 1 through stage 4 chronic kidney disease, or unspecified chronic kidney disease: Secondary | ICD-10-CM | POA: Diagnosis not present

## 2020-10-13 ENCOUNTER — Other Ambulatory Visit: Payer: Self-pay | Admitting: *Deleted

## 2020-10-31 ENCOUNTER — Other Ambulatory Visit: Payer: Self-pay

## 2020-10-31 ENCOUNTER — Encounter: Payer: Self-pay | Admitting: *Deleted

## 2020-10-31 ENCOUNTER — Telehealth (HOSPITAL_COMMUNITY): Payer: Self-pay | Admitting: *Deleted

## 2020-10-31 ENCOUNTER — Other Ambulatory Visit: Payer: Self-pay | Admitting: Interventional Radiology

## 2020-10-31 ENCOUNTER — Ambulatory Visit
Admission: RE | Admit: 2020-10-31 | Discharge: 2020-10-31 | Disposition: A | Discharge status: Home / Self Care | Payer: Medicare Other | Source: Ambulatory Visit | Attending: Interventional Radiology | Admitting: Interventional Radiology

## 2020-10-31 DIAGNOSIS — N2889 Other specified disorders of kidney and ureter: Secondary | ICD-10-CM

## 2020-10-31 DIAGNOSIS — N289 Disorder of kidney and ureter, unspecified: Secondary | ICD-10-CM

## 2020-10-31 HISTORY — PX: IR RADIOLOGIST EVAL & MGMT: IMG5224

## 2020-10-31 NOTE — Telephone Encounter (Signed)
Pt saw Dr Kathlene Cote for L renal mass, pt needs renal biopsy and needs clearance to hold Eliquis 2 days prior. Procedure is not scheduled yet.  Will send to Dr Haroldine Laws for review/recommendations.

## 2020-10-31 NOTE — Progress Notes (Addendum)
Chief Complaint: Patient was consulted remotely today (TeleHealth) for follow-up of left renal mass.  History of Present Illness: [name] is a 70 y.o. male originally seen in consultation for a partially cystic and partially enhancing 1.5 cm left renal mass on 05/06/2019.  At that time, imaging review showed no significant growth of the lesion in approximately 11 months and given history of chronic kidney disease it was decided to follow the lesion further and have him evaluated by Nephrology. He has been followed by Dr. Corliss Parish. On the 09/09/2019 CTA, the posterior interpolar left renal lesion showed no change in appearance since prior imaging with cystic component and suggestion of potential partial enhancement.  This measures approximately 1.5 cm and appeared stable since 04/16/2019. The lesion also measured about 1.5 cm on a 10/16/19 scan and 1.6 cm on a 01/22/20 scan. On a more recent CT dated 08/09/2020, the lesion now may measure up to 1.8 cm in maximum diameter and demonstrates potentially some more prominent partial internal enhancement on the earlier phase of imaging after contrast administration.  He has had some weakness and generalized body pain, but is asymptomatic with respect to the renal mass and has no urinary symptoms.  Past Medical History:  Diagnosis Date   Aortic stenosis, mild    Arthritis of hand    "just a little bit in both hands" (03/31/2012)   Asthma    "little bit" (03/31/2012)   Atrial fibrillation ([place])    dx '04; DCCV '04, placed on flecainide, failed DCCV 04/2010, flecainide stopped; s/p successful A.fib ablation 01/31/12   CHF (congestive heart failure) ([place])    Controlled type 2 diabetes mellitus without complication, without long-term current use of insulin ([place]) 12/13/2019   COPD (chronic obstructive pulmonary disease) ([facility])    DJD (degenerative joint disease)    Fibromyalgia    GERD (gastroesophageal reflux disease)    Hyperlipidemia     Hypertension    Hypothyroidism    S/P radiation   Left atrial enlargement    LA size 82mm by echo 11/21/11   Mitral regurgitation    trivial   Obstructive sleep apnea    mild by sleep study 2013; pt stated he does not have a machine because "it wasn't bad enough for him to have one"   Pacemaker 03/31/2012   Restless leg syndrome    Second degree AV block    Splenomegaly    Visit for monitoring Tikosyn therapy 04/16/2016    Past Surgical History:  Procedure Laterality Date   AORTIC VALVE REPLACEMENT N/A 08/22/2015   Procedure: AORTIC VALVE REPLACEMENT (AVR);  Surgeon: Ivin Poot, MD;  Location: [facility];  Service: Open Heart Surgery;  Laterality: N/A;   ATRIAL FIBRILLATION ABLATION  01/30/2012   PVI by Dr. Rayann Heman   ATRIAL FIBRILLATION ABLATION N/A 01/31/2012   Procedure: ATRIAL FIBRILLATION ABLATION;  Surgeon: Thompson Grayer, MD;  Location: [facility] CATH LAB;  Service: Cardiovascular;  Laterality: N/A;   ATRIAL FIBRILLATION ABLATION N/A 04/19/2020   Procedure: ATRIAL FIBRILLATION ABLATION;  Surgeon: Thompson Grayer, MD;  Location: [place] CV LAB;  Service: Cardiovascular;  Laterality: N/A;   BACK SURGERY     X 3   CARDIAC CATHETERIZATION N/A 08/09/2015   Procedure: Right/Left Heart Cath and Coronary Angiography;  Surgeon: Peter M Martinique, MD;  Location: [place] CV LAB;  Service: Cardiovascular;  Laterality: N/A;   CARDIAC CATHETERIZATION N/A 02/05/2016   Procedure: Right/Left Heart Cath and Coronary Angiography;  Surgeon: Charlann Lange  Irish Lack, MD;  Location: [place] CV LAB;  Service: Cardiovascular;  Laterality: N/A;   CARDIAC CATHETERIZATION N/A 04/17/2016   Procedure: Right Heart Cath;  Surgeon: Jolaine Artist, MD;  Location: [place] CV LAB;  Service: Cardiovascular;  Laterality: N/A;   CARDIOVASCULAR STRESS TEST  03/12/2002   EF 48%, NO EVIDENCE OF ISCHEMIA   CARDIOVERSION  01/2012; 03/31/2012   CARDIOVERSION N/A 02/25/2012   Procedure: CARDIOVERSION;  Surgeon: Thompson Grayer,  MD;  Location: [facility] CATH LAB;  Service: Cardiovascular;  Laterality: N/A;   CARDIOVERSION N/A 03/31/2012   Procedure: CARDIOVERSION;  Surgeon: Thompson Grayer, MD;  Location: [facility] CATH LAB;  Service: Cardiovascular;  Laterality: N/A;   CARDIOVERSION N/A 11/23/2015   Procedure: CARDIOVERSION;  Surgeon: Larey Dresser, MD;  Location: [place];  Service: Cardiovascular;  Laterality: N/A;   CARDIOVERSION N/A 04/08/2016   Procedure: CARDIOVERSION;  Surgeon: Jolaine Artist, MD;  Location: [facility];  Service: Cardiovascular;  Laterality: N/A;   CARDIOVERSION N/A 09/14/2018   Procedure: CARDIOVERSION;  Surgeon: Josue Hector, MD;  Location: [place];  Service: Cardiovascular;  Laterality: N/A;   CARDIOVERSION N/A 10/19/2019   Procedure: CARDIOVERSION;  Surgeon: Jolaine Artist, MD;  Location: [facility] ENDOSCOPY;  Service: Cardiovascular;  Laterality: N/A;   CARDIOVERSION N/A 02/25/2020   Procedure: CARDIOVERSION;  Surgeon: Fay Records, MD;  Location: [place];  Service: Cardiovascular;  Laterality: N/A;   CARDIOVERSION N/A 05/25/2020   Procedure: CARDIOVERSION;  Surgeon: Lelon Perla, MD;  Location: [facility] ENDOSCOPY;  Service: Cardiovascular;  Laterality: N/A;   CARDIOVERSION N/A 07/10/2020   Procedure: CARDIOVERSION;  Surgeon: Jerline Pain, MD;  Location: [place];  Service: Cardiovascular;  Laterality: N/A;   CLIPPING OF ATRIAL APPENDAGE N/A 08/22/2015   Procedure: CLIPPING OF ATRIAL APPENDAGE;  Surgeon: Ivin Poot, MD;  Location: [place];  Service: Open Heart Surgery;  Laterality: N/A;   DOPPLER ECHOCARDIOGRAPHY  03/11/2002   EF 70-75%   FINGER SURGERY Left    Middle finger   FINGER TENDON REPAIR  1980's   "right little finger" (03/31/2012)   INSERT / REPLACE / REMOVE PACEMAKER     [employer]   IR RADIOLOGIST EVAL & MGMT  05/06/2019   IR RADIOLOGIST EVAL & MGMT  10/06/2019   LEFT AND RIGHT HEART CATHETERIZATION WITH CORONARY ANGIOGRAM N/A 10/27/2012   Procedure: LEFT AND RIGHT HEART  CATHETERIZATION WITH CORONARY ANGIOGRAM;  Surgeon: Laverda Page, MD;  Location: [facility] CATH LAB;  Service: Cardiovascular;  Laterality: N/A;   LUMBAR DISC SURGERY  1980's X2;  2000's   MAZE N/A 08/22/2015   Procedure: MAZE;  Surgeon: Ivin Poot, MD;  Location: [place];  Service: Open Heart Surgery;  Laterality: N/A;   PACEMAKER INSERTION  03/31/2012   STJ Accent DR pacemaker implanted by Dr Rayann Heman   PERMANENT PACEMAKER INSERTION N/A 03/31/2012   Procedure: PERMANENT PACEMAKER INSERTION;  Surgeon: Thompson Grayer, MD;  Location: [facility] CATH LAB;  Service: Cardiovascular;  Laterality: N/A;   PPM GENERATOR CHANGEOUT N/A 08/08/2020   Procedure: PPM GENERATOR CHANGEOUT;  Surgeon: Thompson Grayer, MD;  Location: [place] CV LAB;  Service: Cardiovascular;  Laterality: N/A;   TEE WITHOUT CARDIOVERSION  01/30/2012   Procedure: TRANSESOPHAGEAL ECHOCARDIOGRAM (TEE);  Surgeon: Larey Dresser, MD;  Location: [place];  Service: Cardiovascular;  Laterality: N/A;  ablation next day   TEE WITHOUT CARDIOVERSION N/A 08/22/2015   Procedure: TRANSESOPHAGEAL ECHOCARDIOGRAM (TEE);  Surgeon: Ivin Poot, MD;  Location: [place];  Service:  Open Heart Surgery;  Laterality: N/A;   TEE WITHOUT CARDIOVERSION N/A 10/19/2019   Procedure: TRANSESOPHAGEAL ECHOCARDIOGRAM (TEE);  Surgeon: Jolaine Artist, MD;  Location: [facility] ENDOSCOPY;  Service: Cardiovascular;  Laterality: N/A;   US ECHOCARDIOGRAPHY  08/07/2009   EF 55-60%    Allergies: Meloxicam and Vancomycin  Medications: Prior to Admission medications   Medication Sig Start Date End Date Taking? Authorizing Provider  albuterol (PROVENTIL HFA;VENTOLIN HFA) 108 (90 Base) MCG/ACT inhaler Inhale 2 puffs into the lungs every 6 (six) hours as needed for wheezing or shortness of breath. 12/28/15   Collene Gobble, MD  amLODipine (NORVASC) 10 MG tablet Take 0.5 tablets (5 mg total) by mouth daily. 09/18/15   Nahser, Wonda Cheng, MD  apixaban (ELIQUIS) 5 MG TABS tablet Take 1 tablet  (5 mg total) by mouth 2 (two) times daily. 07/06/20   Bensimhon, Shaune Pascal, MD  bisoprolol (ZEBETA) 5 MG tablet TAKE 1 TABLET(5 MG) BY MOUTH DAILY 09/04/20   Bensimhon, Shaune Pascal, MD  Budeson-Glycopyrrol-Formoterol (BREZTRI AEROSPHERE) 160-9-4.8 MCG/ACT AERO Inhale 2 puffs into the lungs in the morning and at bedtime. 03/24/20   Martyn Ehrich, NP  Calcium-Magnesium-Zinc (CAL-MAG-ZINC PO) Take 1 tablet by mouth daily.    [provider]  Cholecalciferol (VITAMIN D) 2000 UNITS CAPS Take 8,000 Units by mouth at bedtime.     [provider]  empagliflozin (JARDIANCE) 10 MG TABS tablet Take 1 tablet (10 mg total) by mouth daily before breakfast. Patient taking differently: Take 10 mg by mouth daily. 03/14/20   Bensimhon, Shaune Pascal, MD  fexofenadine (ALLEGRA) 180 MG tablet Take 1 tablet (180 mg total) by mouth daily as needed for allergies or rhinitis. 01/23/20   Aline August, MD  gabapentin (NEURONTIN) 300 MG capsule Take 600-1,200 mg by mouth See admin instructions. Take 600 mg by mouth in the morning, 600 mg at lunchtime, and take 1200 mg at bedtime 09/13/14   [provider]  guaiFENesin (MUCINEX PO) Take 800 mg by mouth 2 (two) times daily.    [provider]  levothyroxine (SYNTHROID, LEVOTHROID) 175 MCG tablet Take 175 mcg by mouth daily before breakfast.    [provider]  LORazepam (ATIVAN) 1 MG tablet Take 1 tablet (1 mg total) by mouth every 8 (eight) hours as needed (nausea and/or restlessness). 08/09/20   Carmin Muskrat, MD  Menthol-Camphor (TIGER BALM ARTHRITIS RUB EX) Apply 1 application topically continuous as needed (pain).    [provider]  montelukast (SINGULAIR) 10 MG tablet Take 1 tablet (10 mg total) by mouth at bedtime. Patient taking differently: Take 10 mg by mouth daily. 07/19/20   Martyn Ehrich, NP  Multiple Vitamin (MULTIVITAMIN WITH MINERALS) TABS tablet Take 1 tablet by mouth daily. 12/15/19   Sheikh, Georgina Quint Latif, DO   ondansetron (ZOFRAN-ODT) 4 MG disintegrating tablet Take 4 mg by mouth every 8 (eight) hours as needed for nausea or vomiting.    [provider]  oxyCODONE-acetaminophen (PERCOCET/ROXICET) 5-325 MG tablet Take 1 tablet by mouth every 12 (twelve) hours.    [provider]  OXYGEN Inhale 2 L into the lungs continuous.    [provider]  pantoprazole (PROTONIX) 40 MG tablet Take 40 mg by mouth daily.    [provider]  polyethylene glycol powder (GLYCOLAX/MIRALAX) 17 GM/SCOOP powder Take 17 g by mouth daily as needed for mild constipation. 01/23/20   Aline August, MD  Polyvinyl Alcohol-Povidone (REFRESH OP) Place 1 drop into both eyes daily as needed (  dry eyes).    [provider]  potassium chloride SA (KLOR-CON) 20 MEQ tablet Take 20 mEq by mouth See admin instructions. Take 20 meq twice every other day (take only on torsemide days)    [provider]  predniSONE (DELTASONE) 5 MG tablet Take 1 tablet (5 mg total) by mouth daily with breakfast. Patient taking differently: Take 2.5 mg by mouth daily with breakfast. 07/19/20   Martyn Ehrich, NP  protein supplement shake (PREMIER PROTEIN) LIQD Take 11 oz by mouth daily as needed (protein).    [provider]  rOPINIRole (REQUIP) 1 MG tablet Take 1 tablet (1 mg total) by mouth in the morning and at bedtime. 01/23/20   Aline August, MD  rosuvastatin (CRESTOR) 10 MG tablet Take 10 mg by mouth at bedtime.  08/08/16   [provider]  spironolactone (ALDACTONE) 25 MG tablet Take 1 tablet (25 mg total) by mouth daily. 02/16/16   Shirley Friar, PA-C  torsemide (DEMADEX) 20 MG tablet Take 40 mg by mouth See admin instructions. Takes daily every other day with Potassium 82meq    [provider]  traMADol-acetaminophen (ULTRACET) 37.5-325 MG tablet Take 2 tablets by mouth 2 (two) times daily as needed for moderate pain.    [provider]     Family  History  Problem Relation Age of Onset   Heart failure Mother    Stroke Father     Social History   Socioeconomic History   Marital status: Married    Spouse name: Not on file   Number of children: Not on file   Years of education: Not on file   Highest education level: Not on file  Occupational History   Occupation: retired  Tobacco Use   Smoking status: Former    Packs/day: 1.00    Years: 25.00    Pack years: 25.00    Types: Cigarettes    Quit date: 07/18/1991    Years since quitting: 29.3   Smokeless tobacco: Never  Vaping Use   Vaping Use: Never used  Substance and Sexual Activity   Alcohol use: Not Currently    Comment: occasional   Drug use: Yes    Types: Marijuana    Comment: rarely use   Sexual activity: Not Currently  Other Topics Concern   Not on file  Social History Narrative   Lives in [place] with spouse.  Works as a Air traffic controller.   Social Determinants of Health   Financial Resource Strain: Not on file  Food Insecurity: Not on file  Transportation Needs: Not on file  Physical Activity: Not on file  Stress: Not on file  Social Connections: Not on file    ECOG Status: 0 - Asymptomatic  Review of Systems  Constitutional:  Positive for fatigue.  Respiratory: Negative.    Cardiovascular: Negative.   Gastrointestinal: Negative.   Genitourinary: Negative.   Musculoskeletal:        Generalized pain.  Neurological: Negative.    Review of Systems: A 12 point ROS discussed and pertinent positives are indicated in the HPI above.  All other systems are negative.  Physical Exam No direct physical exam was performed (except for noted visual exam findings with Video Visits).   Vital Signs: There were no vitals taken for this visit.  Imaging: No results found.  Labs:  CBC: Recent Labs    05/29/20 1244 05/29/20 1816 07/10/20 0920 07/19/20 1025 08/09/20 0602  WBC 12.3* 9.8  --  9.7 10.2  HGB 16.4 15.9 16.3 14.1 14.0  HCT 50.7  49.2 48.0 43.3 44.1  PLT 203 183  --  165 158    COAGS: No results for input(s): INR, APTT in the last 8760 hours.  BMP: Recent Labs    01/21/20 1616 01/21/20 1811 01/23/20 0430 02/18/20 1107 03/29/20 1043 04/12/20 1214 05/17/20 1200 05/29/20 1244 07/10/20 0920 07/19/20 1025 08/09/20 0602  NA 136 135 136   < > 138 138 137 135 137 137 139  K 4.3 4.6 4.5   < > 4.4 4.2 4.1 3.9 4.2 4.7 4.3  CL 99 97* 96*   < > 96 101 99 95* 99 96 103  CO2 27 26 26    < > 27 26 27 27   --  24 24  GLUCOSE 141* 146* 166*   < > 251* 138* 146* 191* 122* 212* 110*  BUN 20 19 32*   < > 33* 26* 32* 29* 30* 28* 23  CALCIUM 9.4 9.4 9.5   < > 9.6 9.6 9.5 9.9  --  10.0 9.5  CREATININE 1.43* 1.41* 1.56*   < > 1.88* 1.83* 2.11* 1.96* 1.60* 1.76* 1.56*  GFRNONAA 49* 50* 44*   < > 35* 39* 33* 36*  --   --  47*  GFRAA 57* 58* 51*  --  41*  --   --   --   --   --   --    < > = values in this interval not displayed.    LIVER FUNCTION TESTS: Recent Labs    11/30/19 1619 12/12/19 0133 12/14/19 0339 01/22/20 0054  BILITOT 1.4* 1.2 0.7 1.6*  AST 20 24 16 19   ALT 20 25 19  32  ALKPHOS 52 45 36* 50  PROT 8.0 7.2 6.1* 7.1  ALBUMIN 4.6 3.9 3.1* 3.8     Assessment and Plan:  I spoke to [name] and his wife over the phone.  There is some more recent evidence of slow growth of the posterior left interpolar renal lesion which is partially cystic but also demonstrates a solid enhancing component.  He remains at somewhat higher risk of morbidity of undergoing percutaneous cryoablation under general anesthesia given his extensive cardiovascular history.  I did suggest that we could consider performing biopsy of the lesion at this point in determining whether it is malignant.  He is currently on Eliquis which would have to be held for 2 days prior to biopsy.  Based on biopsy results, if the lesion is malignant, we could then consider performing cryoablation under anesthesia after clearance from Cardiology and  Pulmonology.  He said he recently saw Dr. Moshe Cipro around May.  After discussion, [name] and his wife would like to proceed with ultrasound-guided biopsy of the left renal mass as an outpatient.  We will begin the scheduling process and I will perform the biopsy personally.   Electronically Signed: Azzie Roup 10/31/2020, 12:18 PM    I spent a total of 15 Minutes in remote  clinical consultation, greater than 50% of which was counseling/coordinating care for a left renal mass.    Visit type: Audio only (telephone). Audio (no video) only due to patient's lack of internet/smartphone capability. Alternative for in-person consultation at [facility], [address] [place], [place], [place]. This visit type was conducted due to national recommendations for restrictions regarding the COVID-19 Pandemic (e.g. social distancing).  This format is felt to be most appropriate for this patient at this time.  All issues noted in  this document were discussed and addressed.

## 2020-11-02 NOTE — Telephone Encounter (Signed)
Message routed to Dr Kathlene Cote

## 2020-11-08 ENCOUNTER — Ambulatory Visit (INDEPENDENT_AMBULATORY_CARE_PROVIDER_SITE_OTHER): Payer: Medicare Other

## 2020-11-08 DIAGNOSIS — I440 Atrioventricular block, first degree: Secondary | ICD-10-CM | POA: Diagnosis not present

## 2020-11-08 LAB — CUP PACEART REMOTE DEVICE CHECK
Battery Remaining Longevity: 137 mo
Battery Remaining Percentage: 95.5 %
Battery Voltage: 3.04 V
Brady Statistic RV Percent Paced: 99 %
Date Time Interrogation Session: 20220713020020
Implantable Lead Implant Date: 20131203
Implantable Lead Implant Date: 20131203
Implantable Lead Location: 753859
Implantable Lead Location: 753860
Implantable Lead Model: 1948
Implantable Pulse Generator Implant Date: 20220412
Lead Channel Impedance Value: 680 Ohm
Lead Channel Pacing Threshold Amplitude: 1.125 V
Lead Channel Pacing Threshold Pulse Width: 0.5 ms
Lead Channel Sensing Intrinsic Amplitude: 7.5 mV
Lead Channel Setting Pacing Amplitude: 1.375
Lead Channel Setting Pacing Pulse Width: 0.5 ms
Lead Channel Setting Sensing Sensitivity: 3.5 mV
Pulse Gen Model: 2272
Pulse Gen Serial Number: 3919039

## 2020-11-14 ENCOUNTER — Other Ambulatory Visit: Payer: Self-pay | Admitting: Radiology

## 2020-11-15 ENCOUNTER — Other Ambulatory Visit: Payer: Self-pay

## 2020-11-15 ENCOUNTER — Ambulatory Visit (HOSPITAL_COMMUNITY)
Admission: RE | Admit: 2020-11-15 | Discharge: 2020-11-15 | Disposition: A | Discharge status: Home / Self Care | Payer: Medicare Other | Source: Ambulatory Visit | Attending: Interventional Radiology | Admitting: Interventional Radiology

## 2020-11-15 ENCOUNTER — Encounter (HOSPITAL_COMMUNITY): Payer: Self-pay

## 2020-11-15 DIAGNOSIS — N2889 Other specified disorders of kidney and ureter: Secondary | ICD-10-CM | POA: Diagnosis not present

## 2020-11-15 DIAGNOSIS — N289 Disorder of kidney and ureter, unspecified: Secondary | ICD-10-CM

## 2020-11-15 LAB — PROTIME-INR
INR: 1.2 (ref 0.8–1.2)
Prothrombin Time: 14.7 seconds (ref 11.4–15.2)

## 2020-11-15 LAB — CBC
HCT: 46.8 % (ref 39.0–52.0)
Hemoglobin: 15 g/dL (ref 13.0–17.0)
MCH: 29.7 pg (ref 26.0–34.0)
MCHC: 32.1 g/dL (ref 30.0–36.0)
MCV: 92.7 fL (ref 80.0–100.0)
Platelets: 174 10*3/uL (ref 150–400)
RBC: 5.05 MIL/uL (ref 4.22–5.81)
RDW: 15.5 % (ref 11.5–15.5)
WBC: 8.6 10*3/uL (ref 4.0–10.5)
nRBC: 0 % (ref 0.0–0.2)

## 2020-11-15 LAB — GLUCOSE, CAPILLARY
Glucose-Capillary: 112 mg/dL — ABNORMAL HIGH (ref 70–99)
Glucose-Capillary: 100 mg/dL — ABNORMAL HIGH (ref 70–99)

## 2020-11-15 LAB — APTT: aPTT: 34 seconds (ref 24–36)

## 2020-11-15 MED ORDER — SODIUM CHLORIDE 0.9 % IV SOLN: INTRAVENOUS | Status: DC

## 2020-11-15 MED ORDER — FENTANYL CITRATE (PF) 100 MCG/2ML IJ SOLN
INTRAMUSCULAR | Status: AC | PRN
  Administered 2020-11-15 (×2): 25 ug via INTRAVENOUS

## 2020-11-15 MED ORDER — LIDOCAINE HCL (PF) 1 % IJ SOLN
INTRAMUSCULAR | Status: AC
  Filled 2020-11-15: qty 30

## 2020-11-15 MED ORDER — MIDAZOLAM HCL 2 MG/2ML IJ SOLN
INTRAMUSCULAR | Status: AC
  Filled 2020-11-15: qty 2

## 2020-11-15 MED ORDER — SODIUM CHLORIDE 0.9 % IV SOLN
INTRAVENOUS | Status: AC | PRN
  Administered 2020-11-15: 10 mL/h via INTRAVENOUS

## 2020-11-15 MED ORDER — FENTANYL CITRATE (PF) 100 MCG/2ML IJ SOLN
INTRAMUSCULAR | Status: AC
  Filled 2020-11-15: qty 2

## 2020-11-15 MED ORDER — MIDAZOLAM HCL 2 MG/2ML IJ SOLN
INTRAMUSCULAR | Status: AC | PRN
  Administered 2020-11-15 (×2): 0.5 mg via INTRAVENOUS

## 2020-11-15 MED ORDER — GELATIN ABSORBABLE 12-7 MM EX MISC
CUTANEOUS | Status: AC
  Filled 2020-11-15: qty 1

## 2020-11-15 MED ORDER — HYDROCODONE-ACETAMINOPHEN 5-325 MG PO TABS
1.0000 | ORAL_TABLET | ORAL | Status: DC | PRN
Start: 2020-11-15 — End: 2020-11-16
  Administered 2020-11-15: 2 via ORAL
  Filled 2020-11-15: qty 2

## 2020-11-15 NOTE — H&P (Signed)
Chief Complaint: Patient was seen in consultation today for left renal mass  Referring Physician(s): Yamagata,Glenn  Supervising Physician: Aletta Edouard  Patient Status: [facility] - Out-pt  History of Present Illness: [name] is a 70 y.o. male with past medical history of asthma, a fib, CHF, DM2, HTN, OSA who has been followed by Dr. Kathlene Cote since 04/2019 for enlarging renal mass.   On a more recent CT dated 08/09/2020, the lesion now may measure up to 1.8 cm in maximum diameter and demonstrates potentially some more prominent partial internal enhancement on the earlier phase of imaging after contrast administration. He was seen in consultation with Dr. Kathlene Cote 10/31/20 to discuss the merits of proceeding with biopsy.  After discussion he elects to proceed.  He presents for procedure today in his usual state of health.  Denies new concerns or complaints.  He has been NPO.  He has held his Eliquis. He is agreeable to proceed.  Past Medical History:  Diagnosis Date   Aortic stenosis, mild    Arthritis of hand    "just a little bit in both hands" (03/31/2012)   Asthma    "little bit" (03/31/2012)   Atrial fibrillation ([place])    dx '04; DCCV '04, placed on flecainide, failed DCCV 04/2010, flecainide stopped; s/p successful A.fib ablation 01/31/12   CHF (congestive heart failure) ([place])    Controlled type 2 diabetes mellitus without complication, without long-term current use of insulin ([place]) 12/13/2019   COPD (chronic obstructive pulmonary disease) (HCC)    DJD (degenerative joint disease)    Fibromyalgia    GERD (gastroesophageal reflux disease)    Hyperlipidemia    Hypertension    Hypothyroidism    S/P radiation   Left atrial enlargement    LA size 9mm by echo 11/21/11   Mitral regurgitation    trivial   Obstructive sleep apnea    mild by sleep study 2013; pt stated he does not have a machine because "it wasn't bad enough for him to have one"   Pacemaker 03/31/2012   Restless  leg syndrome    Second degree AV block    Splenomegaly    Visit for monitoring Tikosyn therapy 04/16/2016    Past Surgical History:  Procedure Laterality Date   AORTIC VALVE REPLACEMENT N/A 08/22/2015   Procedure: AORTIC VALVE REPLACEMENT (AVR);  Surgeon: Ivin Poot, MD;  Location: [place];  Service: Open Heart Surgery;  Laterality: N/A;   ATRIAL FIBRILLATION ABLATION  01/30/2012   PVI by Dr. Rayann Heman   ATRIAL FIBRILLATION ABLATION N/A 01/31/2012   Procedure: ATRIAL FIBRILLATION ABLATION;  Surgeon: Thompson Grayer, MD;  Location: [facility] CATH LAB;  Service: Cardiovascular;  Laterality: N/A;   ATRIAL FIBRILLATION ABLATION N/A 04/19/2020   Procedure: ATRIAL FIBRILLATION ABLATION;  Surgeon: Thompson Grayer, MD;  Location: [place] CV LAB;  Service: Cardiovascular;  Laterality: N/A;   BACK SURGERY     X 3   CARDIAC CATHETERIZATION N/A 08/09/2015   Procedure: Right/Left Heart Cath and Coronary Angiography;  Surgeon: Peter M Martinique, MD;  Location: [place] CV LAB;  Service: Cardiovascular;  Laterality: N/A;   CARDIAC CATHETERIZATION N/A 02/05/2016   Procedure: Right/Left Heart Cath and Coronary Angiography;  Surgeon: Jettie Booze, MD;  Location: [place] CV LAB;  Service: Cardiovascular;  Laterality: N/A;   CARDIAC CATHETERIZATION N/A 04/17/2016   Procedure: Right Heart Cath;  Surgeon: Jolaine Artist, MD;  Location: [place] CV LAB;  Service: Cardiovascular;  Laterality: N/A;   CARDIOVASCULAR  STRESS TEST  03/12/2002   EF 48%, NO EVIDENCE OF ISCHEMIA   CARDIOVERSION  01/2012; 03/31/2012   CARDIOVERSION N/A 02/25/2012   Procedure: CARDIOVERSION;  Surgeon: Thompson Grayer, MD;  Location: [facility] CATH LAB;  Service: Cardiovascular;  Laterality: N/A;   CARDIOVERSION N/A 03/31/2012   Procedure: CARDIOVERSION;  Surgeon: Thompson Grayer, MD;  Location: [facility] CATH LAB;  Service: Cardiovascular;  Laterality: N/A;   CARDIOVERSION N/A 11/23/2015   Procedure: CARDIOVERSION;  Surgeon: Larey Dresser, MD;   Location: [place];  Service: Cardiovascular;  Laterality: N/A;   CARDIOVERSION N/A 04/08/2016   Procedure: CARDIOVERSION;  Surgeon: Jolaine Artist, MD;  Location: [place];  Service: Cardiovascular;  Laterality: N/A;   CARDIOVERSION N/A 09/14/2018   Procedure: CARDIOVERSION;  Surgeon: Josue Hector, MD;  Location: [place];  Service: Cardiovascular;  Laterality: N/A;   CARDIOVERSION N/A 10/19/2019   Procedure: CARDIOVERSION;  Surgeon: Jolaine Artist, MD;  Location: [facility] ENDOSCOPY;  Service: Cardiovascular;  Laterality: N/A;   CARDIOVERSION N/A 02/25/2020   Procedure: CARDIOVERSION;  Surgeon: Fay Records, MD;  Location: [place];  Service: Cardiovascular;  Laterality: N/A;   CARDIOVERSION N/A 05/25/2020   Procedure: CARDIOVERSION;  Surgeon: Lelon Perla, MD;  Location: [facility] ENDOSCOPY;  Service: Cardiovascular;  Laterality: N/A;   CARDIOVERSION N/A 07/10/2020   Procedure: CARDIOVERSION;  Surgeon: Jerline Pain, MD;  Location: [place];  Service: Cardiovascular;  Laterality: N/A;   CLIPPING OF ATRIAL APPENDAGE N/A 08/22/2015   Procedure: CLIPPING OF ATRIAL APPENDAGE;  Surgeon: Ivin Poot, MD;  Location: [place];  Service: Open Heart Surgery;  Laterality: N/A;   DOPPLER ECHOCARDIOGRAPHY  03/11/2002   EF 70-75%   FINGER SURGERY Left    Middle finger   FINGER TENDON REPAIR  1980's   "right little finger" (03/31/2012)   INSERT / REPLACE / REMOVE PACEMAKER     [employer]   IR RADIOLOGIST EVAL & MGMT  05/06/2019   IR RADIOLOGIST EVAL & MGMT  10/06/2019   IR RADIOLOGIST EVAL & MGMT  10/31/2020   LEFT AND RIGHT HEART CATHETERIZATION WITH CORONARY ANGIOGRAM N/A 10/27/2012   Procedure: LEFT AND RIGHT HEART CATHETERIZATION WITH CORONARY ANGIOGRAM;  Surgeon: Laverda Page, MD;  Location: [facility] CATH LAB;  Service: Cardiovascular;  Laterality: N/A;   LUMBAR DISC SURGERY  1980's X2;  2000's   MAZE N/A 08/22/2015   Procedure: MAZE;  Surgeon: Ivin Poot, MD;  Location: [place];   Service: Open Heart Surgery;  Laterality: N/A;   PACEMAKER INSERTION  03/31/2012   STJ Accent DR pacemaker implanted by Dr Rayann Heman   PERMANENT PACEMAKER INSERTION N/A 03/31/2012   Procedure: PERMANENT PACEMAKER INSERTION;  Surgeon: Thompson Grayer, MD;  Location: [facility] CATH LAB;  Service: Cardiovascular;  Laterality: N/A;   PPM GENERATOR CHANGEOUT N/A 08/08/2020   Procedure: PPM GENERATOR CHANGEOUT;  Surgeon: Thompson Grayer, MD;  Location: [place] CV LAB;  Service: Cardiovascular;  Laterality: N/A;   TEE WITHOUT CARDIOVERSION  01/30/2012   Procedure: TRANSESOPHAGEAL ECHOCARDIOGRAM (TEE);  Surgeon: Larey Dresser, MD;  Location: [place];  Service: Cardiovascular;  Laterality: N/A;  ablation next day   TEE WITHOUT CARDIOVERSION N/A 08/22/2015   Procedure: TRANSESOPHAGEAL ECHOCARDIOGRAM (TEE);  Surgeon: Ivin Poot, MD;  Location: [place];  Service: Open Heart Surgery;  Laterality: N/A;   TEE WITHOUT CARDIOVERSION N/A 10/19/2019   Procedure: TRANSESOPHAGEAL ECHOCARDIOGRAM (TEE);  Surgeon: Jolaine Artist, MD;  Location: [facility] ENDOSCOPY;  Service: Cardiovascular;  Laterality: N/A;   US  ECHOCARDIOGRAPHY  08/07/2009   EF 55-60%    Allergies: Meloxicam and Vancomycin  Medications: Prior to Admission medications   Medication Sig Start Date End Date Taking? Authorizing Provider  albuterol (PROVENTIL HFA;VENTOLIN HFA) 108 (90 Base) MCG/ACT inhaler Inhale 2 puffs into the lungs every 6 (six) hours as needed for wheezing or shortness of breath. 12/28/15  Yes Collene Gobble, MD  amLODipine (NORVASC) 10 MG tablet Take 0.5 tablets (5 mg total) by mouth daily. 09/18/15  Yes Nahser, Wonda Cheng, MD  apixaban (ELIQUIS) 5 MG TABS tablet Take 1 tablet (5 mg total) by mouth 2 (two) times daily. 07/06/20  Yes Bensimhon, Shaune Pascal, MD  bisoprolol (ZEBETA) 5 MG tablet TAKE 1 TABLET(5 MG) BY MOUTH DAILY Patient taking differently: Take 5 mg by mouth daily. 09/04/20  Yes Bensimhon, Shaune Pascal, MD   Budeson-Glycopyrrol-Formoterol (BREZTRI AEROSPHERE) 160-9-4.8 MCG/ACT AERO Inhale 2 puffs into the lungs in the morning and at bedtime. Patient taking differently: Inhale 2 puffs into the lungs 2 (two) times daily. 03/24/20  Yes Martyn Ehrich, NP  Calcium-Magnesium-Zinc (CAL-MAG-ZINC PO) Take 1 tablet by mouth daily. Vit D   Yes [provider]  cholecalciferol (VITAMIN D3) 25 MCG (1000 UNIT) tablet Take 4,000 Units by mouth at bedtime.   Yes [provider]  empagliflozin (JARDIANCE) 10 MG TABS tablet Take 1 tablet (10 mg total) by mouth daily before breakfast. Patient taking differently: Take 10 mg by mouth daily. 03/14/20  Yes Bensimhon, Shaune Pascal, MD  fexofenadine (ALLEGRA) 180 MG tablet Take 1 tablet (180 mg total) by mouth daily as needed for allergies or rhinitis. 01/23/20  Yes Aline August, MD  Fexofenadine HCl (MUCINEX ALLERGY PO) Take 400 mg by mouth 2 (two) times daily.   Yes [provider]  gabapentin (NEURONTIN) 300 MG capsule Take 600-1,200 mg by mouth See admin instructions. Take 600 mg by mouth in the morning, 600 mg at lunchtime, and take 1200 mg at bedtime 09/13/14  Yes [provider]  levothyroxine (SYNTHROID, LEVOTHROID) 175 MCG tablet Take 175 mcg by mouth daily before breakfast.   Yes [provider]  Menthol-Camphor (TIGER BALM ARTHRITIS RUB EX) Apply 1 application topically continuous as needed (pain).   Yes [provider]  montelukast (SINGULAIR) 10 MG tablet Take 1 tablet (10 mg total) by mouth at bedtime. Patient taking differently: Take 10 mg by mouth daily. 07/19/20  Yes Martyn Ehrich, NP  Multiple Vitamin (MULTIVITAMIN WITH MINERALS) TABS tablet Take 1 tablet by mouth daily. 12/15/19  Yes Sheikh, Omair Latif, DO  ondansetron (ZOFRAN-ODT) 4 MG disintegrating tablet Take 4 mg by mouth every 8 (eight) hours as needed for nausea or vomiting.   Yes [provider]  oxyCODONE-acetaminophen  (PERCOCET/ROXICET) 5-325 MG tablet Take 1 tablet by mouth every 12 (twelve) hours.   Yes [provider]  OXYGEN Inhale 2 L into the lungs at bedtime as needed (low oxygen).   Yes [provider]  pantoprazole (PROTONIX) 40 MG tablet Take 40 mg by mouth daily.   Yes [provider]  Polyvinyl Alcohol-Povidone (REFRESH OP) Place 1 drop into both eyes daily as needed (dry eyes).   Yes [provider]  potassium chloride SA (KLOR-CON) 20 MEQ tablet Take 30 mEq by mouth every other day.   Yes [provider]  predniSONE (DELTASONE) 5 MG tablet Take 1 tablet (5 mg total) by mouth daily with breakfast. Patient taking differently: Take 2.5 mg by mouth daily with breakfast. 07/19/20  Yes Martyn Ehrich, NP  rOPINIRole (REQUIP) 1 MG tablet Take 1 tablet (1 mg total) by mouth in the morning and at bedtime. 01/23/20  Yes Aline August, MD  rosuvastatin (CRESTOR) 10 MG tablet Take 10 mg by mouth at bedtime.  08/08/16  Yes [provider]  spironolactone (ALDACTONE) 25 MG tablet Take 1 tablet (25 mg total) by mouth daily. 02/16/16  Yes Shirley Friar, PA-C  torsemide (DEMADEX) 20 MG tablet Take 30 mg by mouth every other day.   Yes [provider]  traMADol (ULTRAM) 50 MG tablet Take 100 mg by mouth 2 (two) times daily. 10/26/20  Yes [provider]  traZODone (DESYREL) 50 MG tablet Take 50 mg by mouth at bedtime. 08/30/20  Yes [provider]  LORazepam (ATIVAN) 1 MG tablet Take 1 tablet (1 mg total) by mouth every 8 (eight) hours as needed (nausea and/or restlessness). Patient not taking: Reported on 11/10/2020 08/09/20   Carmin Muskrat, MD  polyethylene glycol powder [facility]) 17 GM/SCOOP powder Take 17 g by mouth daily as needed for mild constipation. 01/23/20   Aline August, MD  protein supplement shake (PREMIER PROTEIN) LIQD Take 11 oz by mouth daily as needed (protein).    [provider]      Family History  Problem Relation Age of Onset   Heart failure Mother    Stroke Father     Social History   Socioeconomic History   Marital status: Married    Spouse name: Not on file   Number of children: Not on file   Years of education: Not on file   Highest education level: Not on file  Occupational History   Occupation: retired  Tobacco Use   Smoking status: Former    Packs/day: 1.00    Years: 25.00    Pack years: 25.00    Types: Cigarettes    Quit date: 07/18/1991    Years since quitting: 29.3   Smokeless tobacco: Never  Vaping Use   Vaping Use: Never used  Substance and Sexual Activity   Alcohol use: Not Currently    Comment: occasional   Drug use: Yes    Types: Marijuana    Comment: rarely use   Sexual activity: Not Currently  Other Topics Concern   Not on file  Social History Narrative   Lives in [place] with spouse.  Works as a Air traffic controller.   Social Determinants of Health   Financial Resource Strain: Not on file  Food Insecurity: Not on file  Transportation Needs: Not on file  Physical Activity: Not on file  Stress: Not on file  Social Connections: Not on file     Review of Systems: A 12 point ROS discussed and pertinent positives are indicated in the HPI above.  All other systems are negative.  Review of Systems  Constitutional:  Negative for fatigue and fever.  Respiratory:  Negative for cough and shortness of breath.   Cardiovascular:  Negative for chest pain.  Gastrointestinal:  Negative for abdominal pain, nausea and vomiting.  Musculoskeletal:  Negative for back pain.  Psychiatric/Behavioral:  Negative for behavioral problems and confusion.    Vital Signs: BP (!) 144/75   Pulse 60   Temp 97.6 F (36.4 C) (Oral)   Ht 5\' 11"  (1.803 m)   Wt 198 lb (89.8 kg)   SpO2 91%   BMI 27.62 kg/m   Physical Exam Vitals and nursing note reviewed.  Constitutional:      General: He  is not in acute distress.    Appearance: Normal  appearance. He is not ill-appearing.  HENT:     Mouth/Throat:     Mouth: Mucous membranes are moist.     Pharynx: Oropharynx is clear.  Cardiovascular:     Rate and Rhythm: Normal rate and regular rhythm.  Pulmonary:     Effort: Pulmonary effort is normal.     Breath sounds: Normal breath sounds.  Abdominal:     General: Abdomen is flat.     Palpations: Abdomen is soft.  Skin:    General: Skin is warm and dry.  Neurological:     General: No focal deficit present.     Mental Status: He is alert. Mental status is at baseline.  Psychiatric:        Mood and Affect: Mood normal.        Behavior: Behavior normal.        Thought Content: Thought content normal.        Judgment: Judgment normal.     MD Evaluation Airway: WNL Heart: WNL Abdomen: WNL Chest/ Lungs: WNL ASA  Classification: 3 Mallampati/Airway Score: Two   Imaging: CUP PACEART REMOTE DEVICE CHECK  Result Date: 11/08/2020 Scheduled remote reviewed. Normal device function.  Next remote 91 days.  IR Radiologist Eval & Mgmt  Result Date: 10/31/2020 Please refer to notes tab for details about interventional procedure. (Op Note)   Labs:  CBC: Recent Labs    05/29/20 1816 07/10/20 0920 07/19/20 1025 08/09/20 0602 11/15/20 0621  WBC 9.8  --  9.7 10.2 8.6  HGB 15.9 16.3 14.1 14.0 15.0  HCT 49.2 48.0 43.3 44.1 46.8  PLT 183  --  165 158 174    COAGS: Recent Labs    11/15/20 0621  INR 1.2  APTT 34    BMP: Recent Labs    01/21/20 1616 01/21/20 1811 01/23/20 0430 02/18/20 1107 03/29/20 1043 04/12/20 1214 05/17/20 1200 05/29/20 1244 07/10/20 0920 07/19/20 1025 08/09/20 0602  NA 136 135 136   < > 138 138 137 135 137 137 139  K 4.3 4.6 4.5   < > 4.4 4.2 4.1 3.9 4.2 4.7 4.3  CL 99 97* 96*   < > 96 101 99 95* 99 96 103  CO2 27 26 26    < > 27 26 27 27   --  24 24  GLUCOSE 141* 146* 166*   < > 251* 138* 146* 191* 122* 212* 110*  BUN 20 19 32*   < > 33* 26* 32* 29* 30* 28* 23  CALCIUM 9.4 9.4 9.5    < > 9.6 9.6 9.5 9.9  --  10.0 9.5  CREATININE 1.43* 1.41* 1.56*   < > 1.88* 1.83* 2.11* 1.96* 1.60* 1.76* 1.56*  GFRNONAA 49* 50* 44*   < > 35* 39* 33* 36*  --   --  47*  GFRAA 57* 58* 51*  --  41*  --   --   --   --   --   --    < > = values in this interval not displayed.    LIVER FUNCTION TESTS: Recent Labs    11/30/19 1619 12/12/19 0133 12/14/19 0339 01/22/20 0054  BILITOT 1.4* 1.2 0.7 1.6*  AST 20 24 16 19   ALT 20 25 19  32  ALKPHOS 52 45 36* 50  PROT 8.0 7.2 6.1* 7.1  ALBUMIN 4.6 3.9 3.1* 3.8    TUMOR MARKERS: No results for input(s): AFPTM, CEA, CA199, CHROMGRNA  in the last 8760 hours.  Assessment and Plan: Patient with past medical history of left renal mass presents with complaint of image evidence of enhancement/enlargement.   Case reviewed by Dr. Kathlene Cote who approves patient for biopsy.  Patient presents today in their usual state of health.  He has been NPO and is not currently on blood thinners.   Risks and benefits of biopsy was discussed with the patient and/or patient's family including, but not limited to bleeding, infection, damage to adjacent structures or low yield requiring additional tests.  All of the questions were answered and there is agreement to proceed.  Consent signed and in chart.   Thank you for this interesting consult.  I greatly enjoyed meeting [name] and look forward to participating in their care.  A copy of this report was sent to the requesting provider on this date.  Electronically Signed: Docia Barrier, PA 11/15/2020, 8:53 AM   I spent a total of    15 Minutes in face to face in clinical consultation, greater than 50% of which was counseling/coordinating care for left renal mass.

## 2020-11-15 NOTE — Procedures (Signed)
Interventional Radiology Procedure Note  Procedure: US Guided Biopsy of left renal mass  Complications: None  Estimated Blood Loss: < 10 mL  Findings: 18 G core biopsy of left renal mass performed under US guidance.  One core samples obtained and sent to Pathology.  Venetia Night. Kathlene Cote, M.D Pager:  [phone]

## 2020-11-15 NOTE — Sedation Documentation (Signed)
When patient turning himself back onto back, noted some blood on blanket and then noted a small square 1/2 inch skin tear on upper right forearm. When patient told about this stated he scraped it on a rough section of the side rail on stretcher when he rolled himself prone pre procedure. There is a rough patch of plastic on second side rail from top, left side of stretcher where metal meets the plastic top of rail. Placed a bandaid on this, is not currently bleeding. Patient aware we will have stretcher looked at by maintenance. He states he is fine with this.

## 2020-11-15 NOTE — Progress Notes (Signed)
Pt ambulated without difficulty or bleeding.   Discharged home with his wife who will drive and stay with pt x 24 hrs.  Pt's urine prior to DC was pale, yellow and no hematuria was noted.

## 2020-11-16 LAB — SURGICAL PATHOLOGY

## 2020-11-17 ENCOUNTER — Telehealth: Payer: Self-pay | Admitting: Interventional Radiology

## 2020-11-17 NOTE — Progress Notes (Signed)
Interventional Radiology Progress Note  I spoke with [name] over the phone and gave him his biopsy results. 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 The lesion was well visualized by ultrasound and is treatable by percutaneous cryoablation.  Given his significant cardiac and respiratory history, I have recommended he be cleared by Drs. Bensimhon and Byrum prior to being placed under general anesthesia for renal ablation.   [name]. Kathlene Cote, M.D Pager:  [phone]

## 2020-11-22 ENCOUNTER — Encounter: Payer: Medicare Other | Admitting: Internal Medicine

## 2020-11-22 DIAGNOSIS — I442 Atrioventricular block, complete: Secondary | ICD-10-CM

## 2020-11-22 DIAGNOSIS — I484 Atypical atrial flutter: Secondary | ICD-10-CM

## 2020-11-22 DIAGNOSIS — I4819 Other persistent atrial fibrillation: Secondary | ICD-10-CM

## 2020-11-22 DIAGNOSIS — R0602 Shortness of breath: Secondary | ICD-10-CM

## 2020-11-22 DIAGNOSIS — I272 Pulmonary hypertension, unspecified: Secondary | ICD-10-CM

## 2020-11-22 DIAGNOSIS — I5032 Chronic diastolic (congestive) heart failure: Secondary | ICD-10-CM

## 2020-11-28 ENCOUNTER — Other Ambulatory Visit: Payer: Self-pay | Admitting: Primary Care

## 2020-11-30 ENCOUNTER — Other Ambulatory Visit: Payer: Self-pay

## 2020-11-30 MED ORDER — BREZTRI AEROSPHERE 160-9-4.8 MCG/ACT IN AERO: 2.0000 | INHALATION_SPRAY | Freq: Two times a day (BID) | RESPIRATORY_TRACT | 3 refills | Status: DC

## 2020-12-01 NOTE — Progress Notes (Signed)
Remote pacemaker transmission.   

## 2020-12-08 ENCOUNTER — Ambulatory Visit (INDEPENDENT_AMBULATORY_CARE_PROVIDER_SITE_OTHER): Payer: Medicare Other

## 2020-12-08 ENCOUNTER — Other Ambulatory Visit: Payer: Self-pay

## 2020-12-08 ENCOUNTER — Ambulatory Visit: Payer: Medicare Other | Admitting: Adult Health

## 2020-12-08 ENCOUNTER — Encounter: Payer: Self-pay | Admitting: Adult Health

## 2020-12-08 VITALS — BP 122/62 | HR 60 | Temp 97.9°F | Ht 71.0 in | Wt 188.6 lb

## 2020-12-08 DIAGNOSIS — J309 Allergic rhinitis, unspecified: Secondary | ICD-10-CM

## 2020-12-08 DIAGNOSIS — J9611 Chronic respiratory failure with hypoxia: Secondary | ICD-10-CM

## 2020-12-08 DIAGNOSIS — Z01811 Encounter for preprocedural respiratory examination: Secondary | ICD-10-CM

## 2020-12-08 DIAGNOSIS — J449 Chronic obstructive pulmonary disease, unspecified: Secondary | ICD-10-CM | POA: Diagnosis not present

## 2020-12-08 NOTE — Assessment & Plan Note (Signed)
COPD with asthma appears compensated. Check PFTs.  Chest x-ray today.  Continue on current regimen  Plan  Patient Instructions  Continue on TRELEGY 1 puff daily , rinse after use.  Oxygen 2l/m with activity and At bedtime  As needed   Activity as tolerated.  Continue on Singulair daily  Allegra daily As needed   Set up for PFTs  Chest xray today.  Follow up with Cardiology as planned  Follow up with Dr. Lamonte Sakai in 3-4 months and As needed

## 2020-12-08 NOTE — Assessment & Plan Note (Addendum)
Preop pulmonary risk assessment.  Patient is a moderate to high risk with underlying comorbidities with severe COPD and oxygen dependent.  However patient is currently independent and stable. He is not excluded from upcoming surgery.  But discussed potential pulmonary surgical risk factors Check PFT and Chest xray    Major Pulmonary risks identified in the multifactorial risk analysis are but not limited to a) pneumonia; b) recurrent intubation risk; c) prolonged or recurrent acute respiratory failure needing mechanical ventilation; d) prolonged hospitalization; e) DVT/Pulmonary embolism; f) Acute Pulmonary edema  Recommend 1. Short duration of surgery as much as possible and avoid paralytic if possible 2. Recovery in step down or ICU with Pulmonary consultation if indicated.  3. DVT prophylaxis if indicated.  4. Aggressive pulmonary toilet with o2, bronchodilatation, and incentive spirometry and early ambulation

## 2020-12-08 NOTE — Assessment & Plan Note (Signed)
Continue on current regimen .   

## 2020-12-08 NOTE — Patient Instructions (Addendum)
Continue on TRELEGY 1 puff daily , rinse after use.  Oxygen 2l/m with activity and At bedtime  As needed   Activity as tolerated.  Continue on Singulair daily  Allegra daily As needed   Set up for PFTs  Chest xray today.  Follow up with Cardiology as planned  Follow up with Dr. Lamonte Sakai in 3-4 months and As needed

## 2020-12-08 NOTE — Progress Notes (Signed)
$'@Patient'P$  ID: [name], male    DOB: Dec 19, 1950, 70 y.o.   MRN: [id number]  Chief Complaint  Patient presents with   Follow-up    Referring provider: Velna Hatchet, MD  HPI: 70 year old male former smoker followed for COPD/asthma overlap syndrome, chronic hypoxic respiratory failure on oxygen with activity and at bedtime Medical history significant for coronary artery disease, A. fib status post ablation and maze procedure, aortic valve replacement for aortic valve stenosis, secondary pulmonary hypertension   TEST/EVENTS :  PFTs April 2017 showed moderate to severe obstruction and restriction.  FEV1 56%, ratio 70, FVC is 59%, positive bronchodilator response, DLCO 54%.  12/08/2020 Follow up: COPD /Asthma , O2 RF , pulmonary preop risk assessment Patient returns for a 39-monthfollow-up.  Patient has underlying COPD with asthma.  He says overall breathing is doing okay.  He has had no flare of cough or wheezing.  No recent antibiotics  No recent hospitalizations for his COPD.  Patient says he does get winded with heavy activity.  But tries to remain active.  He remains on Trelegy inhaler daily.  Prednisone 2.'5mg'$  daily . Denies any increased albuterol use. Uses oxygen if he needs it with activity.  And wears at bedtime.  Says he has had no increased oxygen demands.   Remains independent.  Lives at home.  Is able to drive.  Does activities at home. Does have chronic allergies.  Is on Singulair and Allegra daily.  Patient has recently been diagnosed with renal cell carcinoma.  Has upcoming renal surgery. He is here for a preop pulmonary risk assessment.  We went over his potential pulmonary surgical risk.  Patient is a moderate to high risk with his underlying severe COPD and oxygen dependent.  However he is currently stable remains independent.  No recent exacerbation or hospitalization for his COPD.  No recent antibiotics.  No increased oxygen demands.  Allergies  Allergen Reactions    Meloxicam Rash   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 syndrome 09/02/15, resolved with diphenhydramine and slowing of rate    Immunization History  Administered Date(s) Administered   Influenza Split 01/27/2013, 02/28/2015   Influenza, Quadrivalent, Recombinant, Inj, Pf 01/05/2018, 03/04/2019, 04/05/2020   Influenza,inj,Quad PF,6+ Mos 01/30/2016   Influenza-Unspecified 01/30/2012, 01/27/2014   Pneumococcal Conjugate-13 12/12/2015   Tdap 12/22/2019    Past Medical History:  Diagnosis Date   Aortic stenosis, mild    Arthritis of hand    "just a little bit in both hands" (03/31/2012)   Asthma    "little bit" (03/31/2012)   [place]    dx '04; DCCV '04, placed on flecainide, failed DCCV 04/2010, flecainide stopped; s/p successful A.fib ablation 01/31/12   CHF (congestive heart failure) ([facility]    Controlled type 2 diabetes mellitus without complication, without long-term current use of insulin ([facility] 12/13/2019   COPD (chronic obstructive pulmonary disease) (HCC)    DJD (degenerative joint disease)    Fibromyalgia    GERD (gastroesophageal reflux disease)    Hyperlipidemia    Hypertension    Hypothyroidism    S/P radiation   Left atrial enlargement    LA size 443mby echo 11/21/11   Mitral regurgitation    trivial   Obstructive sleep apnea    mild by sleep study 2013; pt stated he does not have a machine because "it wasn't bad enough for him to have one"   Pacemaker 03/31/2012   Restless leg syndrome    Second  degree AV block    Splenomegaly    Visit for monitoring Tikosyn therapy 04/16/2016    Tobacco History: Social History   Tobacco Use  Smoking Status Former   Packs/day: 1.00   Years: 25.00   Pack years: 25.00   Types: Cigarettes   Start date: 1963   Quit date: 07/18/1991   Years since quitting: 29.4  Smokeless Tobacco Never   Counseling given: Not Answered   Outpatient Medications Prior to Visit  Medication Sig Dispense Refill    albuterol (PROVENTIL HFA;VENTOLIN HFA) 108 (90 Base) MCG/ACT inhaler Inhale 2 puffs into the lungs every 6 (six) hours as needed for wheezing or shortness of breath. 1 Inhaler 5   amLODipine (NORVASC) 10 MG tablet Take 0.5 tablets (5 mg total) by mouth daily. 30 tablet 11   apixaban (ELIQUIS) 5 MG TABS tablet Take 1 tablet (5 mg total) by mouth 2 (two) times daily. 180 tablet 3   bisoprolol (ZEBETA) 5 MG tablet TAKE 1 TABLET(5 MG) BY MOUTH DAILY (Patient taking differently: Take 5 mg by mouth daily.) 30 tablet 1   Budeson-Glycopyrrol-Formoterol (BREZTRI AEROSPHERE) 160-9-4.8 MCG/ACT AERO Inhale 2 puffs into the lungs in the morning and at bedtime. 32.1 g 3   Calcium-Magnesium-Zinc (CAL-MAG-ZINC PO) Take 1 tablet by mouth daily. Vit D     cholecalciferol (VITAMIN D3) 25 MCG (1000 UNIT) tablet Take 4,000 Units by mouth at bedtime.     empagliflozin (JARDIANCE) 10 MG TABS tablet Take 1 tablet (10 mg total) by mouth daily before breakfast. (Patient taking differently: Take 10 mg by mouth daily.) 30 tablet 6   fexofenadine (ALLEGRA) 180 MG tablet Take 1 tablet (180 mg total) by mouth daily as needed for allergies or rhinitis.     Fexofenadine HCl (MUCINEX ALLERGY PO) Take 400 mg by mouth 2 (two) times daily.     gabapentin (NEURONTIN) 300 MG capsule Take 600-1,200 mg by mouth See admin instructions. Take 600 mg by mouth in the morning, 600 mg at lunchtime, and take 1200 mg at bedtime  3   levothyroxine (SYNTHROID, LEVOTHROID) 175 MCG tablet Take 175 mcg by mouth daily before breakfast.     LORazepam (ATIVAN) 1 MG tablet Take 1 tablet (1 mg total) by mouth every 8 (eight) hours as needed (nausea and/or restlessness). 15 tablet 0   Menthol-Camphor (TIGER BALM ARTHRITIS RUB EX) Apply 1 application topically continuous as needed (pain).     montelukast (SINGULAIR) 10 MG tablet Take 1 tablet (10 mg total) by mouth at bedtime. (Patient taking differently: Take 10 mg by mouth daily.) 30 tablet 11   Multiple  Vitamin (MULTIVITAMIN WITH MINERALS) TABS tablet Take 1 tablet by mouth daily. 30 tablet 0   ondansetron (ZOFRAN-ODT) 4 MG disintegrating tablet Take 4 mg by mouth every 8 (eight) hours as needed for nausea or vomiting.     oxyCODONE-acetaminophen (PERCOCET/ROXICET) 5-325 MG tablet Take 1 tablet by mouth every 12 (twelve) hours.     OXYGEN Inhale 2 L into the lungs at bedtime as needed (low oxygen).     pantoprazole (PROTONIX) 40 MG tablet Take 40 mg by mouth daily.     polyethylene glycol powder (GLYCOLAX/MIRALAX) 17 GM/SCOOP powder Take 17 g by mouth daily as needed for mild constipation.     Polyvinyl Alcohol-Povidone (REFRESH OP) Place 1 drop into both eyes daily as needed (dry eyes).     potassium chloride SA (KLOR-CON) 20 MEQ tablet Take 30 mEq by mouth every other day.  predniSONE (DELTASONE) 5 MG tablet Take 1 tablet (5 mg total) by mouth daily with breakfast. (Patient taking differently: Take 2.5 mg by mouth daily with breakfast.) 30 tablet 0   protein supplement shake (PREMIER PROTEIN) LIQD Take 11 oz by mouth daily as needed (protein).     rOPINIRole (REQUIP) 1 MG tablet Take 1 tablet (1 mg total) by mouth in the morning and at bedtime.     rosuvastatin (CRESTOR) 10 MG tablet Take 10 mg by mouth at bedtime.      spironolactone (ALDACTONE) 25 MG tablet Take 1 tablet (25 mg total) by mouth daily. 90 tablet 3   torsemide (DEMADEX) 20 MG tablet Take 30 mg by mouth every other day.     traMADol (ULTRAM) 50 MG tablet Take 100 mg by mouth 2 (two) times daily.     traZODone (DESYREL) 50 MG tablet Take 50 mg by mouth at bedtime.     No facility-administered medications prior to visit.     Review of Systems:   Constitutional:   No  weight loss, night sweats,  Fevers, chills,  +fatigue, or  lassitude.  HEENT:   No headaches,  Difficulty swallowing,  Tooth/dental problems, or  Sore throat,                No sneezing, itching, ear ache, nasal congestion, post nasal drip,   CV:  No chest  pain,  Orthopnea, PND, swelling in lower extremities, anasarca, dizziness, palpitations, syncope.   GI  No heartburn, indigestion, abdominal pain, nausea, vomiting, diarrhea, change in bowel habits, loss of appetite, bloody stools.   Resp: .  No chest wall deformity  Skin: no rash or lesions.  GU: no dysuria, change in color of urine, no urgency or frequency.  No flank pain, no hematuria   MS:  No joint pain or swelling.  No decreased range of motion.  No back pain.    Physical Exam  BP 122/62 (BP Location: Left Arm, Cuff Size: Normal)   Pulse 60   Temp 97.9 F (36.6 C) (Oral)   Ht '5\' 11"'$  (1.803 m)   Wt 188 lb 9.6 oz (85.5 kg)   SpO2 97%   BMI 26.30 kg/m   GEN: A/Ox3; pleasant , NAD, well nourished    HEENT:  [place]/AT,  NOSE-clear, THROAT-clear, no lesions, no postnasal drip or exudate noted.   NECK:  Supple w/ fair ROM; no JVD; normal carotid impulses w/o bruits; no thyromegaly or nodules palpated; no lymphadenopathy.    RESP  Clear  P & A; w/o, wheezes/ rales/ or rhonchi. no accessory muscle use, no dullness to percussion  CARD:  RRR, no m/r/g, tr  peripheral edema, pulses intact, no cyanosis or clubbing.  GI:   Soft & nt; nml bowel sounds; no organomegaly or masses detected.   Musco: Warm bil, no deformities or joint swelling noted.   Neuro: alert, no focal deficits noted.    Skin: Warm, no lesions or rashes    Lab Results:  CBC   Imaging: US BIOPSY (KIDNEY)  Result Date: 11/15/2020 INDICATION: Enlarging left renal mass. EXAM: ULTRASOUND GUIDED CORE BIOPSY OF left renal mass MEDICATIONS: None. ANESTHESIA/SEDATION: Fentanyl 50 mcg IV; Versed 1.0 mg IV Moderate Sedation Time:  28 minutes. The patient was continuously monitored during the procedure by the interventional radiology nurse under my direct supervision. PROCEDURE: The procedure, risks, benefits, and alternatives were explained to the patient. Questions regarding the procedure were encouraged and answered.  The patient understands and consents  to the procedure. A time-out was performed prior to initiating the procedure. Ultrasound was used to localize a left renal mass. The left flank region was prepped with chlorhexidine in a sterile fashion, and a sterile drape was applied covering the operative field. A sterile gown and sterile gloves were used for the procedure. Local anesthesia was provided with 1% Lidocaine. Under ultrasound guidance, a 17 gauge trocar needle was advanced to the margin of a left renal mass. A single coaxial 18 gauge core biopsy sample was obtained and submitted in formalin. Gel-Foam pledgets were advanced through the outer trocar needle prior to its removal. Additional ultrasound was performed. COMPLICATIONS: None immediate. FINDINGS: Initial ultrasound demonstrates a heterogeneous lesion in the interpolar cortex of the left kidney corresponding to previously imaged enlarging mass. This measures roughly 2 cm by ultrasound. Solid tissue was obtained. Due to immediate bleeding from the trocar needle after a single core biopsy was obtained, further samples were not obtained. Bleeding was controlled initially with administration of Gel-Foam. IMPRESSION: Ultrasound-guided core biopsy performed of a roughly 2 cm left renal mass. Electronically Signed   By: Aletta Edouard M.D.   On: 11/15/2020 10:41      PFT Results Latest Ref Rng & Units 08/21/2015 08/05/2013  FVC-Pre L 2.25 2.56  FVC-Predicted Pre % 49 55  FVC-Post L 2.70 2.84  FVC-Predicted Post % 59 61  Pre FEV1/FVC % % 71 70  Post FEV1/FCV % % 70 72  FEV1-Pre L 1.61 1.78  FEV1-Predicted Pre % 47 51  FEV1-Post L 1.91 2.04  DLCO uncorrected ml/min/mmHg 17.77 19.93  DLCO [facility]% % 54 61  DLVA Predicted % 79 89  TLC L 5.92 5.09  TLC % Predicted % 84 72  RV % Predicted % 145 104    No results found for: NITRICOXIDE      Assessment & Plan:   COPD mixed type (HCC) COPD with asthma appears compensated. Check PFTs.  Chest x-ray  today.  Continue on current regimen  Plan  Patient Instructions  Continue on TRELEGY 1 puff daily , rinse after use.  Oxygen 2l/m with activity and At bedtime  As needed   Activity as tolerated.  Continue on Singulair daily  Allegra daily As needed   Set up for PFTs  Chest xray today.  Follow up with Cardiology as planned  Follow up with Dr. Lamonte Sakai in 3-4 months and As needed        Chronic respiratory failure with hypoxia ([place]) Continue on oxygen to maintain O2 saturations greater than 88 to 90%.  Allergic rhinitis Continue on current regimen  Preop pulmonary/respiratory exam Preop pulmonary risk assessment.  Patient is a moderate to high risk with underlying comorbidities with severe COPD and oxygen dependent.  However patient is currently independent and stable. He is not excluded from upcoming surgery.  But discussed potential pulmonary surgical risk factors Check PFT and Chest xray    Major Pulmonary risks identified in the multifactorial risk analysis are but not limited to a) pneumonia; b) recurrent intubation risk; c) prolonged or recurrent acute respiratory failure needing mechanical ventilation; d) prolonged hospitalization; e) DVT/Pulmonary embolism; f) Acute Pulmonary edema  Recommend 1. Short duration of surgery as much as possible and avoid paralytic if possible 2. Recovery in step down or ICU with Pulmonary consultation if indicated.  3. DVT prophylaxis if indicated.  4. Aggressive pulmonary toilet with o2, bronchodilatation, and incentive spirometry and early ambulation      I spent   40 minutes  dedicated to the care of this patient on the date of this encounter to include pre-visit review of records, face-to-face time with the patient discussing conditions above, post visit ordering of testing, clinical documentation with the electronic health record, making appropriate referrals as documented, and communicating necessary findings to members of the patients  care team.    Rexene Edison, NP 12/08/2020

## 2020-12-08 NOTE — Assessment & Plan Note (Addendum)
Continue on oxygen to maintain O2 saturations greater than 88 to 90%. 

## 2020-12-09 ENCOUNTER — Encounter (HOSPITAL_BASED_OUTPATIENT_CLINIC_OR_DEPARTMENT_OTHER): Payer: Self-pay

## 2020-12-09 ENCOUNTER — Emergency Department (HOSPITAL_BASED_OUTPATIENT_CLINIC_OR_DEPARTMENT_OTHER)
Admission: EM | Admit: 2020-12-09 | Discharge: 2020-12-09 | Disposition: A | Discharge status: Home / Self Care | Payer: Medicare Other | Attending: Emergency Medicine | Admitting: Emergency Medicine

## 2020-12-09 ENCOUNTER — Other Ambulatory Visit: Payer: Self-pay

## 2020-12-09 DIAGNOSIS — G8929 Other chronic pain: Secondary | ICD-10-CM | POA: Diagnosis not present

## 2020-12-09 DIAGNOSIS — I130 Hypertensive heart and chronic kidney disease with heart failure and stage 1 through stage 4 chronic kidney disease, or unspecified chronic kidney disease: Secondary | ICD-10-CM | POA: Insufficient documentation

## 2020-12-09 DIAGNOSIS — E1122 Type 2 diabetes mellitus with diabetic chronic kidney disease: Secondary | ICD-10-CM | POA: Diagnosis not present

## 2020-12-09 DIAGNOSIS — E1142 Type 2 diabetes mellitus with diabetic polyneuropathy: Secondary | ICD-10-CM | POA: Diagnosis not present

## 2020-12-09 DIAGNOSIS — Z7984 Long term (current) use of oral hypoglycemic drugs: Secondary | ICD-10-CM | POA: Diagnosis not present

## 2020-12-09 DIAGNOSIS — I2510 Atherosclerotic heart disease of native coronary artery without angina pectoris: Secondary | ICD-10-CM | POA: Insufficient documentation

## 2020-12-09 DIAGNOSIS — Z87891 Personal history of nicotine dependence: Secondary | ICD-10-CM | POA: Diagnosis not present

## 2020-12-09 DIAGNOSIS — N1832 Chronic kidney disease, stage 3b: Secondary | ICD-10-CM | POA: Insufficient documentation

## 2020-12-09 DIAGNOSIS — J45909 Unspecified asthma, uncomplicated: Secondary | ICD-10-CM | POA: Insufficient documentation

## 2020-12-09 DIAGNOSIS — Z7901 Long term (current) use of anticoagulants: Secondary | ICD-10-CM | POA: Diagnosis not present

## 2020-12-09 DIAGNOSIS — Z955 Presence of coronary angioplasty implant and graft: Secondary | ICD-10-CM | POA: Diagnosis not present

## 2020-12-09 DIAGNOSIS — G6289 Other specified polyneuropathies: Secondary | ICD-10-CM

## 2020-12-09 DIAGNOSIS — I4891 Unspecified atrial fibrillation: Secondary | ICD-10-CM | POA: Diagnosis not present

## 2020-12-09 DIAGNOSIS — J441 Chronic obstructive pulmonary disease with (acute) exacerbation: Secondary | ICD-10-CM | POA: Insufficient documentation

## 2020-12-09 DIAGNOSIS — E039 Hypothyroidism, unspecified: Secondary | ICD-10-CM | POA: Insufficient documentation

## 2020-12-09 DIAGNOSIS — M79604 Pain in right leg: Secondary | ICD-10-CM | POA: Diagnosis present

## 2020-12-09 DIAGNOSIS — I5033 Acute on chronic diastolic (congestive) heart failure: Secondary | ICD-10-CM | POA: Diagnosis not present

## 2020-12-09 DIAGNOSIS — Z79899 Other long term (current) drug therapy: Secondary | ICD-10-CM | POA: Diagnosis not present

## 2020-12-09 MED ORDER — HYDROMORPHONE HCL 1 MG/ML IJ SOLN
1.0000 mg | Freq: Once | INTRAMUSCULAR | Status: AC
  Administered 2020-12-09: 1 mg via INTRAMUSCULAR
  Filled 2020-12-09: qty 1

## 2020-12-09 MED ORDER — ONDANSETRON 4 MG PO TBDP
4.0000 mg | ORAL_TABLET | Freq: Once | ORAL | Status: DC
  Filled 2020-12-09: qty 1

## 2020-12-09 NOTE — Discharge Instructions (Addendum)
Call your primary care doctor or specialist as discussed in the next 2-3 days.   Return immediately back to the ER if:  Your symptoms worsen within the next 12-24 hours. You develop new symptoms such as new fevers, persistent vomiting, new pain, shortness of breath, or new weakness or numbness, or if you have any other concerns.  

## 2020-12-09 NOTE — ED Provider Notes (Signed)
[facility] EMERGENCY DEPT Provider Note   CSN: [id number] Arrival date & time: 12/09/20  1829     History Chief Complaint  Patient presents with   Leg Pain    [name] is a 70 y.o. male.  Patient presents with bilateral leg pain.  He states he has an ache from his thighs to his toes bilaterally.  He describes them as aching pain radiating down his thighs.  It is worse when he is on his feet all day he states he has been on his feet all day today.  He states he had similar pain for 3 to 4 years.  He states he seen his doctor multiple times for this and no one can give him a straight answer he states.  He has been taking tramadol and Percocet for pain management at home however these have not been working in the last 24 hours and he presents to the ER.  He denies any falls.  Does not use a walker or cane.  No reports of fevers or cough or vomiting or diarrhea.      Past Medical History:  Diagnosis Date   Aortic stenosis, mild    Arthritis of hand    "just a little bit in both hands" (03/31/2012)   Asthma    "little bit" (03/31/2012)   Atrial fibrillation ([place])    dx '04; DCCV '04, placed on flecainide, failed DCCV 04/2010, flecainide stopped; s/p successful A.fib ablation 01/31/12   CHF (congestive heart failure) ([place])    Controlled type 2 diabetes mellitus without complication, without long-term current use of insulin ([place]) 12/13/2019   COPD (chronic obstructive pulmonary disease) ([place])    DJD (degenerative joint disease)    Fibromyalgia    GERD (gastroesophageal reflux disease)    Hyperlipidemia    Hypertension    Hypothyroidism    S/P radiation   Left atrial enlargement    LA size 23m by echo 11/21/11   Mitral regurgitation    trivial   Obstructive sleep apnea    mild by sleep study 2013; pt stated he does not have a machine because "it wasn't bad enough for him to have one"   Pacemaker 03/31/2012   Restless leg syndrome    Second degree AV block     Splenomegaly    Visit for monitoring Tikosyn therapy 04/16/2016    Patient Active Problem List   Diagnosis Date Noted   Allergic rhinitis 12/08/2020   Preop pulmonary/respiratory exam 03/27/2020   Acute on chronic diastolic (congestive) heart failure ([facility] 01/22/2020   Abdominal pain 01/22/2020   Hypothyroidism 12/13/2019   Controlled type 2 diabetes mellitus with stage 3 chronic kidney disease, without long-term current use of insulin ([facility] 12/13/2019   Chronic kidney disease, stage 3b ([facility] [place] 12/13/2019   Acute bacterial bronchitis 12/13/2019   COPD exacerbation ([facility] 12/13/2019   COPD with acute exacerbation ([facility] 12/12/2019   COPD mixed type ([facility] 10/16/2019   Typical atrial flutter (HCC)    Chronic respiratory failure with hypoxia ([facility] 05/23/2016   CAD (coronary artery disease) 05/23/2016   Persistent atrial fibrillation (HCC)    Atypical atrial flutter ([facility]    Visit for monitoring Tikosyn therapy 04/16/2016   Chronic diastolic CHF (congestive heart failure) ([facility] 02/03/2016   Cough 12/06/2015   Hallucinations 09/04/2015   Lymphadenopathy 09/04/2015   Ascending aortic aneurysm ([facility] 09/04/2015   Hypoxia 09/02/2015   S/P aortic valve replacement with bioprosthetic valve 09/02/2015   Pleural effusion 09/02/2015   S/P  AVR 08/22/2015   GERD without esophagitis 09/20/2014   Intrinsic asthma 10/13/2013   Dyspnea 09/15/2013   Pulmonary hypertension ([place]) 09/15/2013   Shortness of breath 05/05/2013   Pacemaker-St.Jude 04/01/2012   Bradycardia 03/31/2012   Second degree AV block 03/31/2012   AV block, 1st degree 02/01/2012   PAF (paroxysmal atrial fibrillation) ([place]) 11/13/2011   Fatigue 11/13/2011   Hypertension 11/13/2011   Aortic stenosis 11/13/2011   Sleep apnea 11/13/2011    Past Surgical History:  Procedure Laterality Date   AORTIC VALVE REPLACEMENT N/A 08/22/2015   Procedure: AORTIC VALVE REPLACEMENT (AVR);  Surgeon: Ivin Poot, MD;  Location: [facility];   Service: Open Heart Surgery;  Laterality: N/A;   ATRIAL FIBRILLATION ABLATION  01/30/2012   PVI by Dr. Rayann Heman   ATRIAL FIBRILLATION ABLATION N/A 01/31/2012   Procedure: ATRIAL FIBRILLATION ABLATION;  Surgeon: Thompson Grayer, MD;  Location: [facility] CATH LAB;  Service: Cardiovascular;  Laterality: N/A;   ATRIAL FIBRILLATION ABLATION N/A 04/19/2020   Procedure: ATRIAL FIBRILLATION ABLATION;  Surgeon: Thompson Grayer, MD;  Location: [facility] CV LAB;  Service: Cardiovascular;  Laterality: N/A;   BACK SURGERY     X 3   CARDIAC CATHETERIZATION N/A 08/09/2015   Procedure: Right/Left Heart Cath and Coronary Angiography;  Surgeon: Peter M Martinique, MD;  Location: [facility] CV LAB;  Service: Cardiovascular;  Laterality: N/A;   CARDIAC CATHETERIZATION N/A 02/05/2016   Procedure: Right/Left Heart Cath and Coronary Angiography;  Surgeon: Jettie Booze, MD;  Location: [facility] CV LAB;  Service: Cardiovascular;  Laterality: N/A;   CARDIAC CATHETERIZATION N/A 04/17/2016   Procedure: Right Heart Cath;  Surgeon: Jolaine Artist, MD;  Location: [facility] CV LAB;  Service: Cardiovascular;  Laterality: N/A;   CARDIOVASCULAR STRESS TEST  03/12/2002   EF 48%, NO EVIDENCE OF ISCHEMIA   CARDIOVERSION  01/2012; 03/31/2012   CARDIOVERSION N/A 02/25/2012   Procedure: CARDIOVERSION;  Surgeon: Thompson Grayer, MD;  Location: [facility] CATH LAB;  Service: Cardiovascular;  Laterality: N/A;   CARDIOVERSION N/A 03/31/2012   Procedure: CARDIOVERSION;  Surgeon: Thompson Grayer, MD;  Location: [facility] CATH LAB;  Service: Cardiovascular;  Laterality: N/A;   CARDIOVERSION N/A 11/23/2015   Procedure: CARDIOVERSION;  Surgeon: Larey Dresser, MD;  Location: [facility];  Service: Cardiovascular;  Laterality: N/A;   CARDIOVERSION N/A 04/08/2016   Procedure: CARDIOVERSION;  Surgeon: Jolaine Artist, MD;  Location: [facility];  Service: Cardiovascular;  Laterality: N/A;   CARDIOVERSION N/A 09/14/2018   Procedure: CARDIOVERSION;  Surgeon: Josue Hector, MD;  Location: [facility];  Service: Cardiovascular;  Laterality: N/A;   CARDIOVERSION N/A 10/19/2019   Procedure: CARDIOVERSION;  Surgeon: Jolaine Artist, MD;  Location: [facility] ENDOSCOPY;  Service: Cardiovascular;  Laterality: N/A;   CARDIOVERSION N/A 02/25/2020   Procedure: CARDIOVERSION;  Surgeon: Fay Records, MD;  Location: [facility];  Service: Cardiovascular;  Laterality: N/A;   CARDIOVERSION N/A 05/25/2020   Procedure: CARDIOVERSION;  Surgeon: Lelon Perla, MD;  Location: [facility] ENDOSCOPY;  Service: Cardiovascular;  Laterality: N/A;   CARDIOVERSION N/A 07/10/2020   Procedure: CARDIOVERSION;  Surgeon: Jerline Pain, MD;  Location: [facility];  Service: Cardiovascular;  Laterality: N/A;   CLIPPING OF ATRIAL APPENDAGE N/A 08/22/2015   Procedure: CLIPPING OF ATRIAL APPENDAGE;  Surgeon: Ivin Poot, MD;  Location: [facility];  Service: Open Heart Surgery;  Laterality: N/A;   DOPPLER ECHOCARDIOGRAPHY  03/11/2002   EF 70-75%   FINGER SURGERY Left    Middle finger  FINGER TENDON REPAIR  1980's   "right little finger" (03/31/2012)   INSERT / REPLACE / REMOVE PACEMAKER     St Jude   IR RADIOLOGIST EVAL & MGMT  05/06/2019   IR RADIOLOGIST EVAL & MGMT  10/06/2019   IR RADIOLOGIST EVAL & MGMT  10/31/2020   LEFT AND RIGHT HEART CATHETERIZATION WITH CORONARY ANGIOGRAM N/A 10/27/2012   Procedure: LEFT AND RIGHT HEART CATHETERIZATION WITH CORONARY ANGIOGRAM;  Surgeon: Laverda Page, MD;  Location: [facility] CATH LAB;  Service: Cardiovascular;  Laterality: N/A;   LUMBAR DISC SURGERY  1980's X2;  2000's   MAZE N/A 08/22/2015   Procedure: MAZE;  Surgeon: Ivin Poot, MD;  Location: [facility];  Service: Open Heart Surgery;  Laterality: N/A;   PACEMAKER INSERTION  03/31/2012   STJ Accent DR pacemaker implanted by Dr Rayann Heman   PERMANENT PACEMAKER INSERTION N/A 03/31/2012   Procedure: PERMANENT PACEMAKER INSERTION;  Surgeon: Thompson Grayer, MD;  Location: [facility] CATH LAB;  Service: Cardiovascular;   Laterality: N/A;   PPM GENERATOR CHANGEOUT N/A 08/08/2020   Procedure: PPM GENERATOR CHANGEOUT;  Surgeon: Thompson Grayer, MD;  Location: [facility] CV LAB;  Service: Cardiovascular;  Laterality: N/A;   TEE WITHOUT CARDIOVERSION  01/30/2012   Procedure: TRANSESOPHAGEAL ECHOCARDIOGRAM (TEE);  Surgeon: Larey Dresser, MD;  Location: [facility];  Service: Cardiovascular;  Laterality: N/A;  ablation next day   TEE WITHOUT CARDIOVERSION N/A 08/22/2015   Procedure: TRANSESOPHAGEAL ECHOCARDIOGRAM (TEE);  Surgeon: Ivin Poot, MD;  Location: [facility];  Service: Open Heart Surgery;  Laterality: N/A;   TEE WITHOUT CARDIOVERSION N/A 10/19/2019   Procedure: TRANSESOPHAGEAL ECHOCARDIOGRAM (TEE);  Surgeon: Jolaine Artist, MD;  Location: [facility] ENDOSCOPY;  Service: Cardiovascular;  Laterality: N/A;   US ECHOCARDIOGRAPHY  08/07/2009   EF 55-60%       Family History  Problem Relation Age of Onset   Heart failure Mother    Stroke Father     Social History   Tobacco Use   Smoking status: Former    Packs/day: 1.00    Years: 25.00    Pack years: 25.00    Types: Cigarettes    Start date: 1963    Quit date: 07/18/1991    Years since quitting: 29.4   Smokeless tobacco: Never  Vaping Use   Vaping Use: Never used  Substance Use Topics   Alcohol use: Not Currently    Comment: occasional   Drug use: Yes    Types: Marijuana    Comment: rarely use    Home Medications Prior to Admission medications   Medication Sig Start Date End Date Taking? Authorizing Provider  albuterol (PROVENTIL HFA;VENTOLIN HFA) 108 (90 Base) MCG/ACT inhaler Inhale 2 puffs into the lungs every 6 (six) hours as needed for wheezing or shortness of breath. 12/28/15   Collene Gobble, MD  amLODipine (NORVASC) 10 MG tablet Take 0.5 tablets (5 mg total) by mouth daily. 09/18/15   Nahser, Wonda Cheng, MD  apixaban (ELIQUIS) 5 MG TABS tablet Take 1 tablet (5 mg total) by mouth 2 (two) times daily. 07/06/20   Bensimhon, Shaune Pascal, MD   bisoprolol (ZEBETA) 5 MG tablet TAKE 1 TABLET(5 MG) BY MOUTH DAILY Patient taking differently: Take 5 mg by mouth daily. 09/04/20   Bensimhon, Shaune Pascal, MD  Budeson-Glycopyrrol-Formoterol (BREZTRI AEROSPHERE) 160-9-4.8 MCG/ACT AERO Inhale 2 puffs into the lungs in the morning and at bedtime. 11/30/20   Martyn Ehrich, NP  Calcium-Magnesium-Zinc (CAL-MAG-ZINC PO) Take 1 tablet by mouth  daily. Vit D    [provider]  cholecalciferol (VITAMIN D3) 25 MCG (1000 UNIT) tablet Take 4,000 Units by mouth at bedtime.    [provider]  empagliflozin (JARDIANCE) 10 MG TABS tablet Take 1 tablet (10 mg total) by mouth daily before breakfast. Patient taking differently: Take 10 mg by mouth daily. 03/14/20   Bensimhon, Shaune Pascal, MD  fexofenadine (ALLEGRA) 180 MG tablet Take 1 tablet (180 mg total) by mouth daily as needed for allergies or rhinitis. 01/23/20   Aline August, MD  Fexofenadine HCl (MUCINEX ALLERGY PO) Take 400 mg by mouth 2 (two) times daily.    [provider]  gabapentin (NEURONTIN) 300 MG capsule Take 600-1,200 mg by mouth See admin instructions. Take 600 mg by mouth in the morning, 600 mg at lunchtime, and take 1200 mg at bedtime 09/13/14   [provider]  levothyroxine (SYNTHROID, LEVOTHROID) 175 MCG tablet Take 175 mcg by mouth daily before breakfast.    [provider]  LORazepam (ATIVAN) 1 MG tablet Take 1 tablet (1 mg total) by mouth every 8 (eight) hours as needed (nausea and/or restlessness). 08/09/20   Carmin Muskrat, MD  Menthol-Camphor (TIGER BALM ARTHRITIS RUB EX) Apply 1 application topically continuous as needed (pain).    [provider]  montelukast (SINGULAIR) 10 MG tablet Take 1 tablet (10 mg total) by mouth at bedtime. Patient taking differently: Take 10 mg by mouth daily. 07/19/20   Martyn Ehrich, NP  Multiple Vitamin (MULTIVITAMIN WITH MINERALS) TABS tablet Take 1 tablet by mouth daily. 12/15/19   Sheikh, Georgina Quint Latif,  DO  ondansetron (ZOFRAN-ODT) 4 MG disintegrating tablet Take 4 mg by mouth every 8 (eight) hours as needed for nausea or vomiting.    [provider]  oxyCODONE-acetaminophen (PERCOCET/ROXICET) 5-325 MG tablet Take 1 tablet by mouth every 12 (twelve) hours.    [provider]  OXYGEN Inhale 2 L into the lungs at bedtime as needed (low oxygen).    [provider]  pantoprazole (PROTONIX) 40 MG tablet Take 40 mg by mouth daily.    [provider]  polyethylene glycol powder (GLYCOLAX/MIRALAX) 17 GM/SCOOP powder Take 17 g by mouth daily as needed for mild constipation. 01/23/20   Aline August, MD  Polyvinyl Alcohol-Povidone (REFRESH OP) Place 1 drop into both eyes daily as needed (dry eyes).    [provider]  potassium chloride SA (KLOR-CON) 20 MEQ tablet Take 30 mEq by mouth every other day.    [provider]  predniSONE (DELTASONE) 5 MG tablet Take 1 tablet (5 mg total) by mouth daily with breakfast. Patient taking differently: Take 2.5 mg by mouth daily with breakfast. 07/19/20   Martyn Ehrich, NP  protein supplement shake (PREMIER PROTEIN) LIQD Take 11 oz by mouth daily as needed (protein).    [provider]  rOPINIRole (REQUIP) 1 MG tablet Take 1 tablet (1 mg total) by mouth in the morning and at bedtime. 01/23/20   Aline August, MD  rosuvastatin (CRESTOR) 10 MG tablet Take 10 mg by mouth at bedtime.  08/08/16   [provider]  spironolactone (ALDACTONE) 25 MG tablet Take 1 tablet (25 mg total) by mouth daily. 02/16/16   Shirley Friar, PA-C  torsemide (DEMADEX) 20 MG tablet Take 30 mg by mouth every other day.    [provider]  traMADol (ULTRAM) 50 MG tablet Take 100 mg by mouth 2 (two) times daily. 10/26/20   [provider]  traZODone (  DESYREL) 50 MG tablet Take 50 mg by mouth at bedtime. 08/30/20   [provider]    Allergies    Meloxicam and Vancomycin  Review of Systems    Review of Systems  Physical Exam Updated Vital Signs BP 129/75 (BP Location: Left Arm)   Pulse (!) 58   Temp 98.3 F (36.8 C) (Oral)   Resp 18   Ht '5\' 11"'$  (1.803 m)   Wt 85.3 kg   SpO2 100%   BMI 26.22 kg/m   Physical Exam Constitutional:      Appearance: He is well-developed.  HENT:     Head: Normocephalic.     Nose: Nose normal.  Eyes:     Extraocular Movements: Extraocular movements intact.  Cardiovascular:     Rate and Rhythm: Normal rate.  Pulmonary:     Effort: Pulmonary effort is normal.  Musculoskeletal:     Comments: Has no pain with flexion extension of bilateral hips or knees or ankles.  Lower extremities are well-appearing on visual exam.  Signs of venous insufficiency seen in lower extremities but no edema noted.  No cellulitis noted no abnormal warmth noted.  Compartments are soft and lower extremities are neurovascularly intact.  Dorsalis pedis pulses are 2+ and brisk.  Normal circulation seen to bilateral lower extremities.  Skin:    Coloration: Skin is not jaundiced.  Neurological:     Mental Status: He is alert. Mental status is at baseline.    ED Results / Procedures / Treatments   Labs (all labs ordered are listed, but only abnormal results are displayed) Labs Reviewed - No data to display  EKG None  Radiology No results found.  Procedures Procedures   Medications Ordered in ED Medications  HYDROmorphone (DILAUDID) injection 1 mg (1 mg Intramuscular Given 12/09/20 1954)    ED Course  I have reviewed the triage vital signs and the nursing notes.  Pertinent labs & imaging results that were available during my care of the patient were reviewed by me and considered in my medical decision making (see chart for details).    MDM Rules/Calculators/A&P                           No emergent or acute cause of the patient's chronic pain noted here today.  Given Dilaudid here with improvement of symptoms.  Recommend outpatient follow-up with  his doctor within the week, advised immediate return for worsening symptoms fevers or any additional concerns.  Final Clinical Impression(s) / ED Diagnoses Final diagnoses:  Other polyneuropathy    Rx / DC Orders ED Discharge Orders     None        Luna Fuse, MD 12/09/20 2011

## 2020-12-09 NOTE — ED Triage Notes (Signed)
Pt presents for evaluation of chronic leg pain. Pt reports pain originates in bilateral hips and radiates distally. No new injury per pt. Denies CP/SOB. Pt ambulatory at home.

## 2020-12-10 ENCOUNTER — Other Ambulatory Visit: Payer: Self-pay

## 2020-12-10 ENCOUNTER — Emergency Department (HOSPITAL_BASED_OUTPATIENT_CLINIC_OR_DEPARTMENT_OTHER)
Admission: EM | Admit: 2020-12-10 | Discharge: 2020-12-10 | Disposition: A | Discharge status: Home / Self Care | Payer: Medicare Other | Attending: Emergency Medicine | Admitting: Emergency Medicine

## 2020-12-10 ENCOUNTER — Encounter (HOSPITAL_BASED_OUTPATIENT_CLINIC_OR_DEPARTMENT_OTHER): Payer: Self-pay

## 2020-12-10 ENCOUNTER — Emergency Department (HOSPITAL_BASED_OUTPATIENT_CLINIC_OR_DEPARTMENT_OTHER): Payer: Medicare Other

## 2020-12-10 DIAGNOSIS — G8929 Other chronic pain: Secondary | ICD-10-CM | POA: Insufficient documentation

## 2020-12-10 DIAGNOSIS — M79604 Pain in right leg: Secondary | ICD-10-CM | POA: Insufficient documentation

## 2020-12-10 DIAGNOSIS — R52 Pain, unspecified: Secondary | ICD-10-CM

## 2020-12-10 DIAGNOSIS — E039 Hypothyroidism, unspecified: Secondary | ICD-10-CM | POA: Insufficient documentation

## 2020-12-10 DIAGNOSIS — Z950 Presence of cardiac pacemaker: Secondary | ICD-10-CM | POA: Insufficient documentation

## 2020-12-10 DIAGNOSIS — R112 Nausea with vomiting, unspecified: Secondary | ICD-10-CM | POA: Diagnosis not present

## 2020-12-10 DIAGNOSIS — M549 Dorsalgia, unspecified: Secondary | ICD-10-CM | POA: Insufficient documentation

## 2020-12-10 DIAGNOSIS — J449 Chronic obstructive pulmonary disease, unspecified: Secondary | ICD-10-CM | POA: Insufficient documentation

## 2020-12-10 DIAGNOSIS — M79605 Pain in left leg: Secondary | ICD-10-CM | POA: Insufficient documentation

## 2020-12-10 DIAGNOSIS — K8020 Calculus of gallbladder without cholecystitis without obstruction: Secondary | ICD-10-CM | POA: Diagnosis not present

## 2020-12-10 DIAGNOSIS — Z87891 Personal history of nicotine dependence: Secondary | ICD-10-CM | POA: Insufficient documentation

## 2020-12-10 DIAGNOSIS — K429 Umbilical hernia without obstruction or gangrene: Secondary | ICD-10-CM | POA: Diagnosis not present

## 2020-12-10 DIAGNOSIS — Z7901 Long term (current) use of anticoagulants: Secondary | ICD-10-CM | POA: Insufficient documentation

## 2020-12-10 DIAGNOSIS — N2889 Other specified disorders of kidney and ureter: Secondary | ICD-10-CM | POA: Diagnosis not present

## 2020-12-10 DIAGNOSIS — Z79899 Other long term (current) drug therapy: Secondary | ICD-10-CM | POA: Insufficient documentation

## 2020-12-10 DIAGNOSIS — E1122 Type 2 diabetes mellitus with diabetic chronic kidney disease: Secondary | ICD-10-CM | POA: Diagnosis not present

## 2020-12-10 DIAGNOSIS — N1832 Chronic kidney disease, stage 3b: Secondary | ICD-10-CM | POA: Insufficient documentation

## 2020-12-10 DIAGNOSIS — I5033 Acute on chronic diastolic (congestive) heart failure: Secondary | ICD-10-CM | POA: Diagnosis not present

## 2020-12-10 DIAGNOSIS — J45909 Unspecified asthma, uncomplicated: Secondary | ICD-10-CM | POA: Diagnosis not present

## 2020-12-10 DIAGNOSIS — I2510 Atherosclerotic heart disease of native coronary artery without angina pectoris: Secondary | ICD-10-CM | POA: Diagnosis not present

## 2020-12-10 DIAGNOSIS — I701 Atherosclerosis of renal artery: Secondary | ICD-10-CM | POA: Diagnosis not present

## 2020-12-10 DIAGNOSIS — I130 Hypertensive heart and chronic kidney disease with heart failure and stage 1 through stage 4 chronic kidney disease, or unspecified chronic kidney disease: Secondary | ICD-10-CM | POA: Diagnosis not present

## 2020-12-10 DIAGNOSIS — I10 Essential (primary) hypertension: Secondary | ICD-10-CM | POA: Diagnosis not present

## 2020-12-10 DIAGNOSIS — M545 Low back pain, unspecified: Secondary | ICD-10-CM | POA: Diagnosis not present

## 2020-12-10 LAB — CBC WITH DIFFERENTIAL/PLATELET
Abs Immature Granulocytes: 0.04 10*3/uL (ref 0.00–0.07)
Basophils Absolute: 0 10*3/uL (ref 0.0–0.1)
Basophils Relative: 0 %
Eosinophils Absolute: 0 10*3/uL (ref 0.0–0.5)
Eosinophils Relative: 0 %
HCT: 41.4 % (ref 39.0–52.0)
Hemoglobin: 13.6 g/dL (ref 13.0–17.0)
Immature Granulocytes: 0 %
Lymphocytes Relative: 3 %
Lymphs Abs: 0.4 10*3/uL — ABNORMAL LOW (ref 0.7–4.0)
MCH: 29.2 pg (ref 26.0–34.0)
MCHC: 32.9 g/dL (ref 30.0–36.0)
MCV: 89 fL (ref 80.0–100.0)
Monocytes Absolute: 1.1 10*3/uL — ABNORMAL HIGH (ref 0.1–1.0)
Monocytes Relative: 9 %
Neutro Abs: 9.9 10*3/uL — ABNORMAL HIGH (ref 1.7–7.7)
Neutrophils Relative %: 88 %
Platelets: 135 10*3/uL — ABNORMAL LOW (ref 150–400)
RBC: 4.65 MIL/uL (ref 4.22–5.81)
RDW: 15.7 % — ABNORMAL HIGH (ref 11.5–15.5)
WBC: 11.4 10*3/uL — ABNORMAL HIGH (ref 4.0–10.5)
nRBC: 0 % (ref 0.0–0.2)

## 2020-12-10 LAB — COMPREHENSIVE METABOLIC PANEL
ALT: 18 U/L (ref 0–44)
AST: 20 U/L (ref 15–41)
Albumin: 4.1 g/dL (ref 3.5–5.0)
Alkaline Phosphatase: 71 U/L (ref 38–126)
Anion gap: 8 (ref 5–15)
BUN: 19 mg/dL (ref 8–23)
CO2: 25 mmol/L (ref 22–32)
Calcium: 9.1 mg/dL (ref 8.9–10.3)
Chloride: 103 mmol/L (ref 98–111)
Creatinine, Ser: 1.24 mg/dL (ref 0.61–1.24)
GFR, Estimated: >60 mL/min (ref >60)
Glucose, Bld: 129 mg/dL — ABNORMAL HIGH (ref 70–99)
Potassium: 4.3 mmol/L (ref 3.5–5.1)
Sodium: 136 mmol/L (ref 135–145)
Total Bilirubin: 1.6 mg/dL — ABNORMAL HIGH (ref 0.3–1.2)
Total Protein: 7.3 g/dL (ref 6.5–8.1)

## 2020-12-10 LAB — CK: Total CK: 66 U/L (ref 49–397)

## 2020-12-10 LAB — MAGNESIUM: Magnesium: 2.3 mg/dL (ref 1.7–2.4)

## 2020-12-10 MED ORDER — OXYCODONE-ACETAMINOPHEN 5-325 MG PO TABS
1.0000 | ORAL_TABLET | Freq: Once | ORAL | Status: AC
  Administered 2020-12-10: 1 via ORAL
  Filled 2020-12-10: qty 1

## 2020-12-10 MED ORDER — APIXABAN 2.5 MG PO TABS
5.0000 mg | ORAL_TABLET | Freq: Once | ORAL | Status: AC
  Administered 2020-12-10: 5 mg via ORAL
  Filled 2020-12-10: qty 2

## 2020-12-10 MED ORDER — MORPHINE SULFATE (PF) 4 MG/ML IV SOLN
4.0000 mg | Freq: Once | INTRAVENOUS | Status: AC
  Administered 2020-12-10: 4 mg via INTRAVENOUS
  Filled 2020-12-10: qty 1

## 2020-12-10 MED ORDER — OXYCODONE HCL 5 MG PO TABS
5.0000 mg | ORAL_TABLET | Freq: Every day | ORAL | Status: DC
  Filled 2020-12-10: qty 1

## 2020-12-10 MED ORDER — ONDANSETRON HCL 4 MG/2ML IJ SOLN
4.0000 mg | Freq: Once | INTRAMUSCULAR | Status: AC
  Administered 2020-12-10: 4 mg via INTRAVENOUS
  Filled 2020-12-10: qty 2

## 2020-12-10 MED ORDER — PROMETHAZINE HCL 25 MG PO TABS: 25.0000 mg | ORAL_TABLET | Freq: Four times a day (QID) | ORAL | 0 refills | Status: DC | PRN

## 2020-12-10 MED ORDER — SODIUM CHLORIDE 0.9 % IV BOLUS
1000.0000 mL | Freq: Once | INTRAVENOUS | Status: AC
  Administered 2020-12-10: 1000 mL via INTRAVENOUS

## 2020-12-10 MED ORDER — IOHEXOL 350 MG/ML SOLN
150.0000 mL | Freq: Once | INTRAVENOUS | Status: AC | PRN
  Administered 2020-12-10: 130 mL via INTRAVENOUS

## 2020-12-10 NOTE — ED Notes (Signed)
Pt ambulated in hall with assistance

## 2020-12-10 NOTE — ED Provider Notes (Signed)
  Physical Exam  BP 105/78 (BP Location: Right Arm)   Pulse 61   Temp 98.8 F (37.1 C) (Oral)   Resp 16   Ht '5\' 11"'$  (1.803 m)   Wt 85.3 kg   SpO2 100%   BMI 26.22 kg/m   Physical Exam  ED Course/Procedures     Procedures  MDM  Care assumed at 3 PM.  Patient is here with back pain and leg.  Patient had recent kidney biopsy.  Patient does have good peripheral pulses.  Signout pending CTA aorto bifemoral as well as CT lumbar spine  6:10 PM CTA showed small about of left retroperitoneal hematoma that is expected after biopsy.  CT lumbar spine did not show any obvious neural compression.  Patient ambulated to the bathroom.  He also was able to tolerate p.o.  Stable for discharge. Has good follow up with specialist        Drenda Freeze, MD 12/10/20 [phone]

## 2020-12-10 NOTE — ED Triage Notes (Signed)
Pt arrives with c/o leg and abdominal pain for the last month. Pt arrives in triage on 2L [place] pt reports he uses 2l [place] chronically at home.

## 2020-12-10 NOTE — ED Provider Notes (Signed)
[facility] EMERGENCY DEPARTMENT Provider Note   CSN: [id number] Arrival date & time: 12/10/20  1221     History Chief Complaint  Patient presents with   Leg Pain   Abdominal Pain    [name] is a 70 y.o. male.  [name] presents for his second ER visit in 2 days for diffuse lower extremity pain.  Pain is bilaterally and encompasses the entirety of both lower extremities.  He has not noticed any skin changes or swelling.  No change in his chronic back pain.  He did not have any further evaluation last night but was given pain medication.  He has extensive chronic medical conditions and has recently had a renal biopsy which returned positive for cancer.  He is denying abdominal pain to me, but this was reported in nursing notes.  The history is provided by the patient.  Leg Pain Lower extremity pain location: bilateral lower extremities from thighs to feet. Injury: no   Pain details:    Quality:  Aching   Radiates to:  Does not radiate   Severity:  Severe   Onset quality:  Gradual   Duration:  1 month   Timing:  Constant   Progression:  Worsening Chronicity:  Chronic Prior injury to area:  No Relieved by: better with walking. Exacerbated by: worse with rest. Ineffective treatments: oxycodone. Associated symptoms: back pain (chronic; h/o back surgery)   Associated symptoms: no decreased ROM, no fever, no muscle weakness, no numbness, no swelling and no tingling       Past Medical History:  Diagnosis Date   Aortic stenosis, mild    Arthritis of hand    "just a little bit in both hands" (03/31/2012)   Asthma    "little bit" (03/31/2012)   Atrial fibrillation ([place])    dx '04; DCCV '04, placed on flecainide, failed DCCV 04/2010, flecainide stopped; s/p successful A.fib ablation 01/31/12   CHF (congestive heart failure) ([place])    Controlled type 2 diabetes mellitus without complication, without long-term current use of insulin ([place]) 12/13/2019   COPD  (chronic obstructive pulmonary disease) ([place])    DJD (degenerative joint disease)    Fibromyalgia    GERD (gastroesophageal reflux disease)    Hyperlipidemia    Hypertension    Hypothyroidism    S/P radiation   Left atrial enlargement    LA size 51m by echo 11/21/11   Mitral regurgitation    trivial   Obstructive sleep apnea    mild by sleep study 2013; pt stated he does not have a machine because "it wasn't bad enough for him to have one"   Pacemaker 03/31/2012   Restless leg syndrome    Second degree AV block    Splenomegaly    Visit for monitoring Tikosyn therapy 04/16/2016    Patient Active Problem List   Diagnosis Date Noted   Allergic rhinitis 12/08/2020   Preop pulmonary/respiratory exam 03/27/2020   Acute on chronic diastolic (congestive) heart failure ([facility] 01/22/2020   Abdominal pain 01/22/2020   Hypothyroidism 12/13/2019   Controlled type 2 diabetes mellitus with stage 3 chronic kidney disease, without long-term current use of insulin ([facility] 12/13/2019   Chronic kidney disease, stage 3b ([facility] 12/13/2019   Acute bacterial bronchitis 12/13/2019   COPD exacerbation ([facility] 12/13/2019   COPD with acute exacerbation ([facility] 12/12/2019   COPD mixed type ([facility] 10/16/2019   Typical atrial flutter ([facility]    Chronic respiratory failure with hypoxia ([facility] 05/23/2016  CAD (coronary artery disease) 05/23/2016   Persistent atrial fibrillation ([facility])    Atypical atrial flutter ([place])    Visit for monitoring Tikosyn therapy 04/16/2016   Chronic diastolic CHF (congestive heart failure) ([place]) 02/03/2016   Cough 12/06/2015   Hallucinations 09/04/2015   Lymphadenopathy 09/04/2015   Ascending aortic aneurysm ([place]) 09/04/2015   Hypoxia 09/02/2015   S/P aortic valve replacement with bioprosthetic valve 09/02/2015   Pleural effusion 09/02/2015   S/P AVR 08/22/2015   GERD without esophagitis 09/20/2014   Intrinsic asthma 10/13/2013   Dyspnea 09/15/2013   Pulmonary hypertension ([place])  09/15/2013   Shortness of breath 05/05/2013   Pacemaker-St.Jude 04/01/2012   Bradycardia 03/31/2012   Second degree AV block 03/31/2012   AV block, 1st degree 02/01/2012   PAF (paroxysmal atrial fibrillation) ([place]) 11/13/2011   Fatigue 11/13/2011   Hypertension 11/13/2011   Aortic stenosis 11/13/2011   Sleep apnea 11/13/2011    Past Surgical History:  Procedure Laterality Date   AORTIC VALVE REPLACEMENT N/A 08/22/2015   Procedure: AORTIC VALVE REPLACEMENT (AVR);  Surgeon: Ivin Poot, MD;  Location: [facility];  Service: Open Heart Surgery;  Laterality: N/A;   ATRIAL FIBRILLATION ABLATION  01/30/2012   PVI by Dr. Rayann Heman   ATRIAL FIBRILLATION ABLATION N/A 01/31/2012   Procedure: ATRIAL FIBRILLATION ABLATION;  Surgeon: Thompson Grayer, MD;  Location: [facility] CATH LAB;  Service: Cardiovascular;  Laterality: N/A;   ATRIAL FIBRILLATION ABLATION N/A 04/19/2020   Procedure: ATRIAL FIBRILLATION ABLATION;  Surgeon: Thompson Grayer, MD;  Location: [facility] CV LAB;  Service: Cardiovascular;  Laterality: N/A;   BACK SURGERY     X 3   CARDIAC CATHETERIZATION N/A 08/09/2015   Procedure: Right/Left Heart Cath and Coronary Angiography;  Surgeon: Peter M Martinique, MD;  Location: [facility] CV LAB;  Service: Cardiovascular;  Laterality: N/A;   CARDIAC CATHETERIZATION N/A 02/05/2016   Procedure: Right/Left Heart Cath and Coronary Angiography;  Surgeon: Jettie Booze, MD;  Location: [facility] CV LAB;  Service: Cardiovascular;  Laterality: N/A;   CARDIAC CATHETERIZATION N/A 04/17/2016   Procedure: Right Heart Cath;  Surgeon: Jolaine Artist, MD;  Location: [facility] CV LAB;  Service: Cardiovascular;  Laterality: N/A;   CARDIOVASCULAR STRESS TEST  03/12/2002   EF 48%, NO EVIDENCE OF ISCHEMIA   CARDIOVERSION  01/2012; 03/31/2012   CARDIOVERSION N/A 02/25/2012   Procedure: CARDIOVERSION;  Surgeon: Thompson Grayer, MD;  Location: [facility] CATH LAB;  Service: Cardiovascular;  Laterality: N/A;   CARDIOVERSION N/A  03/31/2012   Procedure: CARDIOVERSION;  Surgeon: Thompson Grayer, MD;  Location: [facility] CATH LAB;  Service: Cardiovascular;  Laterality: N/A;   CARDIOVERSION N/A 11/23/2015   Procedure: CARDIOVERSION;  Surgeon: Larey Dresser, MD;  Location: [facility];  Service: Cardiovascular;  Laterality: N/A;   CARDIOVERSION N/A 04/08/2016   Procedure: CARDIOVERSION;  Surgeon: Jolaine Artist, MD;  Location: [facility] ENDOSCOPY;  Service: Cardiovascular;  Laterality: N/A;   CARDIOVERSION N/A 09/14/2018   Procedure: CARDIOVERSION;  Surgeon: Josue Hector, MD;  Location: [facility] ENDOSCOPY;  Service: Cardiovascular;  Laterality: N/A;   CARDIOVERSION N/A 10/19/2019   Procedure: CARDIOVERSION;  Surgeon: Jolaine Artist, MD;  Location: [facility] ENDOSCOPY;  Service: Cardiovascular;  Laterality: N/A;   CARDIOVERSION N/A 02/25/2020   Procedure: CARDIOVERSION;  Surgeon: Fay Records, MD;  Location: [facility];  Service: Cardiovascular;  Laterality: N/A;   CARDIOVERSION N/A 05/25/2020   Procedure: CARDIOVERSION;  Surgeon: Lelon Perla, MD;  Location: [facility] ENDOSCOPY;  Service: Cardiovascular;  Laterality: N/A;  CARDIOVERSION N/A 07/10/2020   Procedure: CARDIOVERSION;  Surgeon: Jerline Pain, MD;  Location: [facility] ENDOSCOPY;  Service: Cardiovascular;  Laterality: N/A;   CLIPPING OF ATRIAL APPENDAGE N/A 08/22/2015   Procedure: CLIPPING OF ATRIAL APPENDAGE;  Surgeon: Ivin Poot, MD;  Location: [facility];  Service: Open Heart Surgery;  Laterality: N/A;   DOPPLER ECHOCARDIOGRAPHY  03/11/2002   EF 70-75%   FINGER SURGERY Left    Middle finger   FINGER TENDON REPAIR  1980's   "right little finger" (03/31/2012)   INSERT / REPLACE / REMOVE PACEMAKER     St Jude   IR RADIOLOGIST EVAL & MGMT  05/06/2019   IR RADIOLOGIST EVAL & MGMT  10/06/2019   IR RADIOLOGIST EVAL & MGMT  10/31/2020   LEFT AND RIGHT HEART CATHETERIZATION WITH CORONARY ANGIOGRAM N/A 10/27/2012   Procedure: LEFT AND RIGHT HEART CATHETERIZATION WITH CORONARY ANGIOGRAM;  Surgeon:  Laverda Page, MD;  Location: [facility] CATH LAB;  Service: Cardiovascular;  Laterality: N/A;   LUMBAR DISC SURGERY  1980's X2;  2000's   MAZE N/A 08/22/2015   Procedure: MAZE;  Surgeon: Ivin Poot, MD;  Location: [facility];  Service: Open Heart Surgery;  Laterality: N/A;   PACEMAKER INSERTION  03/31/2012   STJ Accent DR pacemaker implanted by Dr Rayann Heman   PERMANENT PACEMAKER INSERTION N/A 03/31/2012   Procedure: PERMANENT PACEMAKER INSERTION;  Surgeon: Thompson Grayer, MD;  Location: [facility] CATH LAB;  Service: Cardiovascular;  Laterality: N/A;   PPM GENERATOR CHANGEOUT N/A 08/08/2020   Procedure: PPM GENERATOR CHANGEOUT;  Surgeon: Thompson Grayer, MD;  Location: [place] CV LAB;  Service: Cardiovascular;  Laterality: N/A;   TEE WITHOUT CARDIOVERSION  01/30/2012   Procedure: TRANSESOPHAGEAL ECHOCARDIOGRAM (TEE);  Surgeon: Larey Dresser, MD;  Location: [facility];  Service: Cardiovascular;  Laterality: N/A;  ablation next day   TEE WITHOUT CARDIOVERSION N/A 08/22/2015   Procedure: TRANSESOPHAGEAL ECHOCARDIOGRAM (TEE);  Surgeon: Ivin Poot, MD;  Location: [facility];  Service: Open Heart Surgery;  Laterality: N/A;   TEE WITHOUT CARDIOVERSION N/A 10/19/2019   Procedure: TRANSESOPHAGEAL ECHOCARDIOGRAM (TEE);  Surgeon: Jolaine Artist, MD;  Location: [facility] ENDOSCOPY;  Service: Cardiovascular;  Laterality: N/A;   US ECHOCARDIOGRAPHY  08/07/2009   EF 55-60%       Family History  Problem Relation Age of Onset   Heart failure Mother    Stroke Father     Social History   Tobacco Use   Smoking status: Former    Packs/day: 1.00    Years: 25.00    Pack years: 25.00    Types: Cigarettes    Start date: 1963    Quit date: 07/18/1991    Years since quitting: 29.4   Smokeless tobacco: Never  Vaping Use   Vaping Use: Never used  Substance Use Topics   Alcohol use: Not Currently    Comment: occasional   Drug use: Yes    Types: Marijuana    Comment: rarely use    Home Medications Prior to Admission  medications   Medication Sig Start Date End Date Taking? Authorizing Provider  albuterol (PROVENTIL HFA;VENTOLIN HFA) 108 (90 Base) MCG/ACT inhaler Inhale 2 puffs into the lungs every 6 (six) hours as needed for wheezing or shortness of breath. 12/28/15   Collene Gobble, MD  amLODipine (NORVASC) 10 MG tablet Take 0.5 tablets (5 mg total) by mouth daily. 09/18/15   Nahser, Wonda Cheng, MD  apixaban (ELIQUIS) 5 MG TABS tablet Take 1 tablet (5 mg  total) by mouth 2 (two) times daily. 07/06/20   Bensimhon, Shaune Pascal, MD  bisoprolol (ZEBETA) 5 MG tablet TAKE 1 TABLET(5 MG) BY MOUTH DAILY Patient taking differently: Take 5 mg by mouth daily. 09/04/20   Bensimhon, Shaune Pascal, MD  Budeson-Glycopyrrol-Formoterol (BREZTRI AEROSPHERE) 160-9-4.8 MCG/ACT AERO Inhale 2 puffs into the lungs in the morning and at bedtime. 11/30/20   Martyn Ehrich, NP  Calcium-Magnesium-Zinc (CAL-MAG-ZINC PO) Take 1 tablet by mouth daily. Vit D    [provider]  cholecalciferol (VITAMIN D3) 25 MCG (1000 UNIT) tablet Take 4,000 Units by mouth at bedtime.    [provider]  empagliflozin (JARDIANCE) 10 MG TABS tablet Take 1 tablet (10 mg total) by mouth daily before breakfast. Patient taking differently: Take 10 mg by mouth daily. 03/14/20   Bensimhon, Shaune Pascal, MD  fexofenadine (ALLEGRA) 180 MG tablet Take 1 tablet (180 mg total) by mouth daily as needed for allergies or rhinitis. 01/23/20   Aline August, MD  Fexofenadine HCl (MUCINEX ALLERGY PO) Take 400 mg by mouth 2 (two) times daily.    [provider]  gabapentin (NEURONTIN) 300 MG capsule Take 600-1,200 mg by mouth See admin instructions. Take 600 mg by mouth in the morning, 600 mg at lunchtime, and take 1200 mg at bedtime 09/13/14   [provider]  levothyroxine (SYNTHROID, LEVOTHROID) 175 MCG tablet Take 175 mcg by mouth daily before breakfast.    [provider]  LORazepam (ATIVAN) 1 MG tablet Take 1 tablet (1 mg total) by mouth every  8 (eight) hours as needed (nausea and/or restlessness). 08/09/20   Carmin Muskrat, MD  Menthol-Camphor (TIGER BALM ARTHRITIS RUB EX) Apply 1 application topically continuous as needed (pain).    [provider]  montelukast (SINGULAIR) 10 MG tablet Take 1 tablet (10 mg total) by mouth at bedtime. Patient taking differently: Take 10 mg by mouth daily. 07/19/20   Martyn Ehrich, NP  Multiple Vitamin (MULTIVITAMIN WITH MINERALS) TABS tablet Take 1 tablet by mouth daily. 12/15/19   Sheikh, Georgina Quint Latif, DO  ondansetron (ZOFRAN-ODT) 4 MG disintegrating tablet Take 4 mg by mouth every 8 (eight) hours as needed for nausea or vomiting.    [provider]  oxyCODONE-acetaminophen (PERCOCET/ROXICET) 5-325 MG tablet Take 1 tablet by mouth every 12 (twelve) hours.    [provider]  OXYGEN Inhale 2 L into the lungs at bedtime as needed (low oxygen).    [provider]  pantoprazole (PROTONIX) 40 MG tablet Take 40 mg by mouth daily.    [provider]  polyethylene glycol powder (GLYCOLAX/MIRALAX) 17 GM/SCOOP powder Take 17 g by mouth daily as needed for mild constipation. 01/23/20   Aline August, MD  Polyvinyl Alcohol-Povidone (REFRESH OP) Place 1 drop into both eyes daily as needed (dry eyes).    [provider]  potassium chloride SA (KLOR-CON) 20 MEQ tablet Take 30 mEq by mouth every other day.    [provider]  predniSONE (DELTASONE) 5 MG tablet Take 1 tablet (5 mg total) by mouth daily with breakfast. Patient taking differently: Take 2.5 mg by mouth daily with breakfast. 07/19/20   Martyn Ehrich, NP  protein supplement shake (PREMIER PROTEIN) LIQD Take 11 oz by mouth daily as needed (protein).    [provider]  rOPINIRole (REQUIP) 1 MG tablet Take 1 tablet (1 mg total) by mouth in the morning and at bedtime. 01/23/20   Aline August, MD  rosuvastatin (CRESTOR) 10 MG tablet Take  10 mg by mouth at bedtime.  08/08/16   [provider]  spironolactone (ALDACTONE) 25 MG tablet Take 1 tablet (25 mg total) by mouth daily. 02/16/16   Shirley Friar, PA-C  torsemide (DEMADEX) 20 MG tablet Take 30 mg by mouth every other day.    [provider]  traMADol (ULTRAM) 50 MG tablet Take 100 mg by mouth 2 (two) times daily. 10/26/20   [provider]  traZODone (DESYREL) 50 MG tablet Take 50 mg by mouth at bedtime. 08/30/20   [provider]    Allergies    Meloxicam and Vancomycin  Review of Systems   Review of Systems  Constitutional:  Negative for chills and fever.  HENT:  Negative for ear pain and sore throat.   Eyes:  Negative for pain and visual disturbance.  Respiratory:  Negative for cough and shortness of breath.   Cardiovascular:  Negative for chest pain and palpitations.  Gastrointestinal:  Negative for abdominal pain and vomiting.  Genitourinary:  Negative for dysuria and hematuria.  Musculoskeletal:  Positive for back pain (chronic; h/o back surgery). Negative for arthralgias.  Skin:  Negative for color change and rash.  Neurological:  Negative for seizures and syncope.  All other systems reviewed and are negative.  Physical Exam Updated Vital Signs BP 131/69 (BP Location: Left Arm)   Pulse (!) 59   Temp 98.8 F (37.1 C) (Oral)   Resp 18   Ht '5\' 11"'$  (1.803 m)   Wt 85.3 kg   SpO2 99%   BMI 26.22 kg/m   Physical Exam Vitals and nursing note reviewed.  Constitutional:      Appearance: Normal appearance.     Comments: Appears chronically ill  HENT:     Head: Normocephalic and atraumatic.  Cardiovascular:     Rate and Rhythm: Normal rate and regular rhythm.     Heart sounds: Normal heart sounds.  Pulmonary:     Effort: Pulmonary effort is normal.     Breath sounds: Normal breath sounds.  Abdominal:     General: There is no distension.     Tenderness: There is no abdominal tenderness. There is no guarding.  Musculoskeletal:     Cervical back: Normal  range of motion.     Right lower leg: No edema.     Left lower leg: No edema.     Comments: Patient is able to sit up and down without trouble.  Range of motion of the back does not seem to provoke his leg pain.  The legs are normal to inspection aside from mild hyperpigmentation at the lower legs on the anterior aspect.  This appears consistent with mild venous insufficiency.  He does some evidence of muscular atrophy, and he has some difficulty holding his legs up for 10 seconds per side.  He has a little bit of muscle fasciculation.  Hip range of motion is normal.  Knee range of motion is normal.  No joint effusions noted.  Dorsalis pedis and posterior tibialis pulses are normal.  Cap refill is normal at the toes.  Skin:    General: Skin is warm and dry.  Neurological:     General: No focal deficit present.     Mental Status: He is oriented to person, place, and time.  Psychiatric:        Mood and Affect: Mood normal.        Behavior: Behavior normal.    ED Results / Procedures / Treatments  Labs (all labs ordered are listed, but only abnormal results are displayed) Labs Reviewed  COMPREHENSIVE METABOLIC PANEL - Abnormal; Notable for the following components:      Result Value   Glucose, Bld 129 (*)    Total Bilirubin 1.6 (*)    All other components within normal limits  CBC WITH DIFFERENTIAL/PLATELET - Abnormal; Notable for the following components:   WBC 11.4 (*)    RDW 15.7 (*)    Platelets 135 (*)    Neutro Abs 9.9 (*)    Lymphs Abs 0.4 (*)    Monocytes Absolute 1.1 (*)    All other components within normal limits  CK  URINALYSIS, ROUTINE W REFLEX MICROSCOPIC    EKG EKG Interpretation  Date/Time:  Sunday December 10 2020 12:44:32 EDT Ventricular Rate:  60 PR Interval:    QRS Duration: 204 QT Interval:  494 QTC Calculation: 494 R Axis:   -73 Text Interpretation: Ventricular-paced rhythm Abnormal ECG No obvious ischemia Similar to prior Confirmed by Lorre Munroe  ([id number]) on 12/10/2020 12:51:08 PM  Radiology DG Chest 2 View  Result Date: 12/10/2020 CLINICAL DATA:  COPD. EXAM: CHEST - 2 VIEW COMPARISON:  08/09/2020 FINDINGS: Sternotomy wires and left-sided pacemaker unchanged. Prosthetic aortic valve unchanged. Lungs are adequately inflated with subtle prominence of the perihilar vessels which may be due to minimal chronic vascular congestion. No focal airspace consolidation or effusion. Cardiomediastinal silhouette and remainder of the exam is unchanged. IMPRESSION: Possible minimal chronic vascular congestion. Electronically Signed   By: Marin Olp M.D.   On: 12/10/2020 12:31    Procedures Procedures   Medications Ordered in ED Medications  oxyCODONE-acetaminophen (PERCOCET/ROXICET) 5-325 MG per tablet 1 tablet (1 tablet Oral Given 12/10/20 1345)  iohexol (OMNIPAQUE) 350 MG/ML injection 150 mL (150 mLs Intravenous Contrast Given 12/10/20 1500)    ED Course  I have reviewed the triage vital signs and the nursing notes.  Pertinent labs & imaging results that were available during my care of the patient were reviewed by me and considered in my medical decision making (see chart for details).    MDM Rules/Calculators/A&P                           [name] presents with bilateral leg pain.  Physical exam does not reveal obvious vascular pathology, neurologic compromise, cellulitis, compartment syndrome. Electrolytes will be checked, CK will be checked, CT aorta with run-off will be checked. Patient signed-out to Dr. Darl Householder who will follow the patient. Final Clinical Impression(s) / ED Diagnoses Final diagnoses:  Pain in both lower extremities    Rx / DC Orders ED Discharge Orders     None        Arnaldo Natal, MD 12/10/20 1510

## 2020-12-10 NOTE — ED Notes (Signed)
Pt tolerated sips of water

## 2020-12-10 NOTE — Discharge Instructions (Addendum)
Continue to take your Zofran as prescribed by your doctor.  I prescribed some Phenergan as needed for nausea.  Continue your Percocet for pain  See your doctor for follow-up  Return to ER if you have worse vomiting, abdominal pain, back pain and leg pain

## 2020-12-11 ENCOUNTER — Telehealth: Payer: Self-pay | Admitting: Adult Health

## 2020-12-11 MED ORDER — MONTELUKAST SODIUM 10 MG PO TABS: 10.0000 mg | ORAL_TABLET | Freq: Every day | ORAL | 3 refills | Status: DC

## 2020-12-11 MED ORDER — PREDNISONE 10 MG PO TABS: ORAL_TABLET | ORAL | 0 refills | Status: DC

## 2020-12-11 NOTE — Telephone Encounter (Signed)
Pts wife stated that since Friday (12/08/2020) she has now had to take him to the emergency room twice on Saturday and Sunday and she stated that he has been having a difficult time breathing and has not been able to eat and has been nauseous as well and she is wanting to possibly have him seen by Nurse Parrett.  Pharmacy; [facility],  - [address] AT [address]  [address], [place] [id number]   Pls regard; [phone]

## 2020-12-11 NOTE — Telephone Encounter (Signed)
Spoke with wife , patient with COPD flare , increased dyspnea and cough . No fever or discolored mucus. On Prednisone 2.'5mg'$  daily  Requests steroid pack to help with sx.  No chest pain orthopnea or edema.   Advised will send short steroid burst , if not improving or worsens will need ov  Please contact office for sooner follow up if symptoms do not improve or worsen or seek emergency care    Rx has been sent

## 2020-12-11 NOTE — Telephone Encounter (Signed)
ER notes reviewed, went to ER for Back and leg pain .  CT angio and CT spine - no acute findings.   Called patient, no answer   Last ov , patient doing well . Using oxygen with activity As needed  .  Advised to use Oxygen 2l/m with activity , goal O2 sat>88-90% .   Continue on BREZTRI  Albuterol As needed    Will need ov if not improving  Please contact office for sooner follow up if symptoms do not improve or worsen or seek emergency care

## 2020-12-11 NOTE — Telephone Encounter (Signed)
Called spoek with patient.  Wife concerned because patient cannot breath and he is nauseated. I asked if they tested him for covid, because I didn't see any results in chart, she said she had an at home test she would use. Patient on 2L oxygen walking around his oxygen drops Saturday went to 82% patient went to ED Saturday and Sunday did nothing for his breathing. Patient's wife says prednisone sometimes helps with this and his kidney function is well.   Sending to Rexene Edison NP for further recommendations.

## 2020-12-15 NOTE — Progress Notes (Signed)
I called the patient and left a message for him to call back.

## 2020-12-18 NOTE — Progress Notes (Signed)
Called and spoke with patient, advised of results per Rexene Edison NP.  He verbalized understanding.  Nothing further needed.

## 2020-12-19 ENCOUNTER — Other Ambulatory Visit: Payer: Self-pay | Admitting: Emergency Medicine

## 2020-12-19 DIAGNOSIS — J449 Chronic obstructive pulmonary disease, unspecified: Secondary | ICD-10-CM

## 2020-12-20 ENCOUNTER — Other Ambulatory Visit: Payer: Self-pay

## 2020-12-20 ENCOUNTER — Ambulatory Visit: Payer: Medicare Other

## 2020-12-20 ENCOUNTER — Other Ambulatory Visit: Payer: Self-pay | Admitting: Emergency Medicine

## 2020-12-21 ENCOUNTER — Other Ambulatory Visit (HOSPITAL_COMMUNITY): Payer: Self-pay | Admitting: Internal Medicine

## 2020-12-21 ENCOUNTER — Other Ambulatory Visit: Payer: Self-pay | Admitting: *Deleted

## 2020-12-21 LAB — SARS CORONAVIRUS 2 (TAT 6-24 HRS): SARS Coronavirus 2: NEGATIVE

## 2020-12-22 ENCOUNTER — Other Ambulatory Visit (HOSPITAL_COMMUNITY): Payer: Self-pay

## 2020-12-22 ENCOUNTER — Ambulatory Visit (INDEPENDENT_AMBULATORY_CARE_PROVIDER_SITE_OTHER): Payer: Medicare Other | Admitting: Emergency Medicine

## 2020-12-22 ENCOUNTER — Other Ambulatory Visit: Payer: Self-pay

## 2020-12-22 ENCOUNTER — Ambulatory Visit (INDEPENDENT_AMBULATORY_CARE_PROVIDER_SITE_OTHER): Payer: Medicare Other | Admitting: Internal Medicine

## 2020-12-22 VITALS — BP 122/66 | HR 60 | Ht 71.0 in | Wt 183.0 lb

## 2020-12-22 DIAGNOSIS — I442 Atrioventricular block, complete: Secondary | ICD-10-CM

## 2020-12-22 DIAGNOSIS — I5032 Chronic diastolic (congestive) heart failure: Secondary | ICD-10-CM

## 2020-12-22 DIAGNOSIS — I4819 Other persistent atrial fibrillation: Secondary | ICD-10-CM

## 2020-12-22 DIAGNOSIS — I272 Pulmonary hypertension, unspecified: Secondary | ICD-10-CM

## 2020-12-22 DIAGNOSIS — J449 Chronic obstructive pulmonary disease, unspecified: Secondary | ICD-10-CM

## 2020-12-22 DIAGNOSIS — I484 Atypical atrial flutter: Secondary | ICD-10-CM | POA: Diagnosis not present

## 2020-12-22 DIAGNOSIS — R0602 Shortness of breath: Secondary | ICD-10-CM

## 2020-12-22 NOTE — Progress Notes (Signed)
PCP: Velna Hatchet, MD Primary Cardiologist: Dr Haroldine Laws Primary EP:  Dr Rayann Heman  [name] is a 70 y.o. male who presents today for routine electrophysiology followup.  Since his generator change, the patient reports doing reasonably well.  He denies SOB today.  Today, he denies symptoms of palpitations, chest pain, lower extremity edema, dizziness, presyncope, or syncope.  The patient is otherwise without complaint today.   Past Medical History:  Diagnosis Date   Aortic stenosis, mild    Arthritis of hand    "just a little bit in both hands" (03/31/2012)   Asthma    "little bit" (03/31/2012)   Atrial fibrillation ([place])    dx '04; DCCV '04, placed on flecainide, failed DCCV 04/2010, flecainide stopped; s/p successful A.fib ablation 01/31/12   CHF (congestive heart failure) ([place])    Controlled type 2 diabetes mellitus without complication, without long-term current use of insulin ([place]) 12/13/2019   COPD (chronic obstructive pulmonary disease) (HCC)    DJD (degenerative joint disease)    Fibromyalgia    GERD (gastroesophageal reflux disease)    Hyperlipidemia    Hypertension    Hypothyroidism    S/P radiation   Left atrial enlargement    LA size 74m by echo 11/21/11   Mitral regurgitation    trivial   Obstructive sleep apnea    mild by sleep study 2013; pt stated he does not have a machine because "it wasn't bad enough for him to have one"   Pacemaker 03/31/2012   Restless leg syndrome    Second degree AV block    Splenomegaly    Visit for monitoring Tikosyn therapy 04/16/2016   Past Surgical History:  Procedure Laterality Date   AORTIC VALVE REPLACEMENT N/A 08/22/2015   Procedure: AORTIC VALVE REPLACEMENT (AVR);  Surgeon: PIvin Poot MD;  [place]  Service: Open Heart Surgery;  Laterality: N/A;   ATRIAL FIBRILLATION ABLATION  01/30/2012   PVI by Dr. ARayann Heman  ATRIAL FIBRILLATION ABLATION N/A 01/31/2012   Procedure: ATRIAL FIBRILLATION ABLATION;  Surgeon:  JThompson Grayer MD;  [facility];  Service: Cardiovascular;  Laterality: N/A;   ATRIAL FIBRILLATION ABLATION N/A 04/19/2020   Procedure: ATRIAL FIBRILLATION ABLATION;  Surgeon: AThompson Grayer MD;  [place];  Service: Cardiovascular;  Laterality: N/A;   BACK SURGERY     X 3   CARDIAC CATHETERIZATION N/A 08/09/2015   Procedure: Right/Left Heart Cath and Coronary Angiography;  Surgeon: Peter M JMartinique MD;  [place];  Service: Cardiovascular;  Laterality: N/A;   CARDIAC CATHETERIZATION N/A 02/05/2016   Procedure: Right/Left Heart Cath and Coronary Angiography;  Surgeon: JJettie Booze MD;  [place];  Service: Cardiovascular;  Laterality: N/A;   CARDIAC CATHETERIZATION N/A 04/17/2016   Procedure: Right Heart Cath;  Surgeon: DJolaine Artist MD;  [place];  Service: Cardiovascular;  Laterality: N/A;   CARDIOVASCULAR STRESS TEST  03/12/2002   EF 48%, NO EVIDENCE OF ISCHEMIA   CARDIOVERSION  01/2012; 03/31/2012   CARDIOVERSION N/A 02/25/2012   Procedure: CARDIOVERSION;  Surgeon: JThompson Grayer MD;  [facility];  Service: Cardiovascular;  Laterality: N/A;   CARDIOVERSION N/A 03/31/2012   Procedure: CARDIOVERSION;  Surgeon: JThompson Grayer MD;  [facility];  Service: Cardiovascular;  Laterality: N/A;   CARDIOVERSION N/A 11/23/2015   Procedure: CARDIOVERSION;  Surgeon: DLarey Dresser MD;  [place]  Service: Cardiovascular;  Laterality:  N/A;   CARDIOVERSION N/A 04/08/2016   Procedure: CARDIOVERSION;  Surgeon: Jolaine Artist, MD;  Location: [place];  Service: Cardiovascular;  Laterality: N/A;   CARDIOVERSION N/A 09/14/2018   Procedure: CARDIOVERSION;  Surgeon: Josue Hector, MD;  Location: [place];  Service: Cardiovascular;  Laterality: N/A;   CARDIOVERSION N/A 10/19/2019   Procedure: CARDIOVERSION;  Surgeon: Jolaine Artist, MD;  Location: [facility] ENDOSCOPY;   Service: Cardiovascular;  Laterality: N/A;   CARDIOVERSION N/A 02/25/2020   Procedure: CARDIOVERSION;  Surgeon: Fay Records, MD;  Location: [place];  Service: Cardiovascular;  Laterality: N/A;   CARDIOVERSION N/A 05/25/2020   Procedure: CARDIOVERSION;  Surgeon: Lelon Perla, MD;  Location: [facility] ENDOSCOPY;  Service: Cardiovascular;  Laterality: N/A;   CARDIOVERSION N/A 07/10/2020   Procedure: CARDIOVERSION;  Surgeon: Jerline Pain, MD;  Location: [place];  Service: Cardiovascular;  Laterality: N/A;   CLIPPING OF ATRIAL APPENDAGE N/A 08/22/2015   Procedure: CLIPPING OF ATRIAL APPENDAGE;  Surgeon: Ivin Poot, MD;  Location: [place];  Service: Open Heart Surgery;  Laterality: N/A;   DOPPLER ECHOCARDIOGRAPHY  03/11/2002   EF 70-75%   FINGER SURGERY Left    Middle finger   FINGER TENDON REPAIR  1980's   "right little finger" (03/31/2012)   INSERT / REPLACE / REMOVE PACEMAKER     [employer]   IR RADIOLOGIST EVAL & MGMT  05/06/2019   IR RADIOLOGIST EVAL & MGMT  10/06/2019   IR RADIOLOGIST EVAL & MGMT  10/31/2020   LEFT AND RIGHT HEART CATHETERIZATION WITH CORONARY ANGIOGRAM N/A 10/27/2012   Procedure: LEFT AND RIGHT HEART CATHETERIZATION WITH CORONARY ANGIOGRAM;  Surgeon: Laverda Page, MD;  Location: [facility] CATH LAB;  Service: Cardiovascular;  Laterality: N/A;   LUMBAR DISC SURGERY  1980's X2;  2000's   MAZE N/A 08/22/2015   Procedure: MAZE;  Surgeon: Ivin Poot, MD;  Location: [place];  Service: Open Heart Surgery;  Laterality: N/A;   PACEMAKER INSERTION  03/31/2012   STJ Accent DR pacemaker implanted by Dr Rayann Heman   PERMANENT PACEMAKER INSERTION N/A 03/31/2012   Procedure: PERMANENT PACEMAKER INSERTION;  Surgeon: Thompson Grayer, MD;  Location: [facility] CATH LAB;  Service: Cardiovascular;  Laterality: N/A;   PPM GENERATOR CHANGEOUT N/A 08/08/2020   Procedure: PPM GENERATOR CHANGEOUT;  Surgeon: Thompson Grayer, MD;  Location: [place] CV LAB;  Service: Cardiovascular;  Laterality: N/A;   TEE  WITHOUT CARDIOVERSION  01/30/2012   Procedure: TRANSESOPHAGEAL ECHOCARDIOGRAM (TEE);  Surgeon: Larey Dresser, MD;  Location: [place];  Service: Cardiovascular;  Laterality: N/A;  ablation next day   TEE WITHOUT CARDIOVERSION N/A 08/22/2015   Procedure: TRANSESOPHAGEAL ECHOCARDIOGRAM (TEE);  Surgeon: Ivin Poot, MD;  Location: [place];  Service: Open Heart Surgery;  Laterality: N/A;   TEE WITHOUT CARDIOVERSION N/A 10/19/2019   Procedure: TRANSESOPHAGEAL ECHOCARDIOGRAM (TEE);  Surgeon: Jolaine Artist, MD;  Location: [facility] ENDOSCOPY;  Service: Cardiovascular;  Laterality: N/A;   US ECHOCARDIOGRAPHY  08/07/2009   EF 55-60%    ROS- all systems are reviewed and negative except as per HPI above  Current Outpatient Medications  Medication Sig Dispense Refill   albuterol (PROVENTIL HFA;VENTOLIN HFA) 108 (90 Base) MCG/ACT inhaler Inhale 2 puffs into the lungs every 6 (six) hours as needed for wheezing or shortness of breath. 1 Inhaler 5   amLODipine (NORVASC) 10 MG tablet Take 0.5 tablets (5 mg total) by mouth daily. 30 tablet 11   apixaban (ELIQUIS) 5 MG TABS tablet Take 1  tablet (5 mg total) by mouth 2 (two) times daily. 180 tablet 3   bisoprolol (ZEBETA) 5 MG tablet TAKE 1 TABLET(5 MG) BY MOUTH DAILY (Patient taking differently: Take 5 mg by mouth daily.) 30 tablet 1   Budeson-Glycopyrrol-Formoterol (BREZTRI AEROSPHERE) 160-9-4.8 MCG/ACT AERO Inhale 2 puffs into the lungs in the morning and at bedtime. 32.1 g 3   Calcium-Magnesium-Zinc (CAL-MAG-ZINC PO) Take 1 tablet by mouth daily. Vit D     cholecalciferol (VITAMIN D3) 25 MCG (1000 UNIT) tablet Take 4,000 Units by mouth at bedtime.     empagliflozin (JARDIANCE) 10 MG TABS tablet Take 1 tablet (10 mg total) by mouth daily before breakfast. (Patient taking differently: Take 10 mg by mouth daily.) 30 tablet 6   fexofenadine (ALLEGRA) 180 MG tablet Take 1 tablet (180 mg total) by mouth daily as needed for allergies or rhinitis.     gabapentin  (NEURONTIN) 300 MG capsule Take 600-1,200 mg by mouth See admin instructions. Take 600 mg by mouth in the morning, 600 mg at lunchtime, and take 1200 mg at bedtime  3   levothyroxine (SYNTHROID, LEVOTHROID) 175 MCG tablet Take 175 mcg by mouth daily before breakfast.     LORazepam (ATIVAN) 1 MG tablet Take 1 tablet (1 mg total) by mouth every 8 (eight) hours as needed (nausea and/or restlessness). 15 tablet 0   Menthol-Camphor (TIGER BALM ARTHRITIS RUB EX) Apply 1 application topically continuous as needed (pain).     montelukast (SINGULAIR) 10 MG tablet Take 1 tablet (10 mg total) by mouth at bedtime. 90 tablet 3   Multiple Vitamin (MULTIVITAMIN WITH MINERALS) TABS tablet Take 1 tablet by mouth daily. 30 tablet 0   ondansetron (ZOFRAN-ODT) 4 MG disintegrating tablet Take 4 mg by mouth every 8 (eight) hours as needed for nausea or vomiting.     oxyCODONE-acetaminophen (PERCOCET/ROXICET) 5-325 MG tablet Take 1 tablet by mouth every 12 (twelve) hours.     OXYGEN Inhale 2 L into the lungs at bedtime as needed (low oxygen).     pantoprazole (PROTONIX) 40 MG tablet Take 40 mg by mouth daily.     polyethylene glycol powder (GLYCOLAX/MIRALAX) 17 GM/SCOOP powder Take 17 g by mouth daily as needed for mild constipation.     Polyvinyl Alcohol-Povidone (REFRESH OP) Place 1 drop into both eyes daily as needed (dry eyes).     potassium chloride SA (KLOR-CON) 20 MEQ tablet Take 30 mEq by mouth every other day.     predniSONE (DELTASONE) 10 MG tablet 2 tabs daily for 5 days, 1 tab daily for 5 days, 1/2 tab daily for 5 days 20 tablet 0   promethazine (PHENERGAN) 25 MG tablet Take 1 tablet (25 mg total) by mouth every 6 (six) hours as needed for nausea or vomiting. 15 tablet 0   protein supplement shake (PREMIER PROTEIN) LIQD Take 11 oz by mouth daily as needed (protein).     rOPINIRole (REQUIP) 1 MG tablet Take 1 tablet (1 mg total) by mouth in the morning and at bedtime.     rosuvastatin (CRESTOR) 10 MG tablet  Take 10 mg by mouth at bedtime.      spironolactone (ALDACTONE) 25 MG tablet Take 1 tablet (25 mg total) by mouth daily. 90 tablet 3   torsemide (DEMADEX) 20 MG tablet Take 30 mg by mouth every other day.     traMADol (ULTRAM) 50 MG tablet Take 100 mg by mouth 2 (two) times daily.     traZODone (DESYREL) 50 MG tablet  Take 50 mg by mouth at bedtime.     predniSONE (DELTASONE) 5 MG tablet Take 1 tablet (5 mg total) by mouth daily with breakfast. (Patient not taking: Reported on 12/22/2020) 30 tablet 0   No current facility-administered medications for this visit.    Physical Exam: Vitals:   12/22/20 1543  BP: 122/66  Pulse: 60  SpO2: 93%  Weight: 183 lb (83 kg)  Height: '5\' 11"'$  (1.803 m)     GEN- The patient is well appearing, alert and oriented x 3 today.  He looks better than I have seen him in years.  Head- normocephalic, atraumatic Eyes-  Sclera clear, conjunctiva pink Ears- hearing intact Oropharynx- clear Lungs- Clear to ausculation bilaterally, normal work of breathing Chest- pacemaker pocket is well healed Heart- Regular rate and rhythm (paced) GI- soft, NT, ND, + BS Extremities- no clubbing, cyanosis, or edema  Pacemaker interrogation- reviewed in detail today,  See PACEART report  ekg tracing ordered today is personally reviewed and shows afib, V paced  Assessment and Plan:  1. Symptomatic complete heart block Normal pacemaker function See Pace Art report No changes today he is device dependant today  2. Permanent atrial fibrillation/ atypical atrial flutter S/p MAZE and multiple PVIs Not a candidate for further EP procedures or medicines  3. Chronic diastolic dysfunction with RV failure/ pulmonary htn Stable No change required today  4. SOB Multifactorial Chronic Follows with CHF and pulmonary teams  Risks, benefits and potential toxicities for medications prescribed and/or refilled reviewed with patient today.   Return to see EP PA in 6  months  Thompson Grayer MD, [facility] 12/22/2020 4:00 PM

## 2020-12-22 NOTE — Progress Notes (Signed)
PFT done today. 

## 2020-12-22 NOTE — Patient Instructions (Addendum)
Medication Instructions:  Your physician recommends that you continue on your current medications as directed. Please refer to the Current Medication list given to you today.  Labwork: None ordered.  Testing/Procedures: None ordered.  Follow-Up: Your physician wants you to follow-up in: 2 /27/23 at 11 am Legrand Como "Jonni Sanger" [place], PA-C     Remote monitoring is used to monitor your Pacemaker from home. This monitoring reduces the number of office visits required to check your device to one time per year. It allows [place] to keep an eye on the functioning of your device to ensure it is working properly. You are scheduled for a device check from home on 02/07/21. You may send your transmission at any time that day. If you have a wireless device, the transmission will be sent automatically. After your physician reviews your transmission, you will receive a postcard with your next transmission date.  Any Other Special Instructions Will Be Listed Below (If Applicable).  If you need a refill on your cardiac medications before your next appointment, please call your pharmacy.

## 2020-12-25 DIAGNOSIS — F3340 Major depressive disorder, recurrent, in remission, unspecified: Secondary | ICD-10-CM | POA: Diagnosis not present

## 2020-12-25 DIAGNOSIS — L03114 Cellulitis of left upper limb: Secondary | ICD-10-CM | POA: Diagnosis not present

## 2020-12-25 DIAGNOSIS — W548XXA Other contact with dog, initial encounter: Secondary | ICD-10-CM | POA: Diagnosis not present

## 2020-12-27 DIAGNOSIS — N1832 Chronic kidney disease, stage 3b: Secondary | ICD-10-CM | POA: Diagnosis not present

## 2020-12-27 DIAGNOSIS — E1169 Type 2 diabetes mellitus with other specified complication: Secondary | ICD-10-CM | POA: Diagnosis not present

## 2020-12-27 DIAGNOSIS — I5042 Chronic combined systolic (congestive) and diastolic (congestive) heart failure: Secondary | ICD-10-CM | POA: Diagnosis not present

## 2020-12-27 DIAGNOSIS — I4891 Unspecified atrial fibrillation: Secondary | ICD-10-CM | POA: Diagnosis not present

## 2020-12-27 DIAGNOSIS — Z950 Presence of cardiac pacemaker: Secondary | ICD-10-CM | POA: Diagnosis not present

## 2020-12-27 DIAGNOSIS — Z23 Encounter for immunization: Secondary | ICD-10-CM | POA: Diagnosis not present

## 2020-12-28 ENCOUNTER — Telehealth (HOSPITAL_COMMUNITY): Payer: Self-pay | Admitting: Cardiology

## 2020-12-28 NOTE — Telephone Encounter (Signed)
ORDER SENT TO DR Kathlene Cote

## 2020-12-28 NOTE — Telephone Encounter (Signed)
team member from Dr Lattie Corns office call to request another cardiac clearance.  Pt will be having a renal cryoablation in the near future, please advise further

## 2020-12-30 LAB — PULMONARY FUNCTION TEST
DL/VA % pred: 65 %
DL/VA: 2.65 ml/min/mmHg/L
DLCO cor % pred: 49 %
DLCO cor: 12.7 ml/min/mmHg
DLCO unc % pred: 47 %
DLCO unc: 12.32 ml/min/mmHg
FEF 25-75 Post: 1.5 L/sec
FEF 25-75 Pre: 1.13 L/sec
FEF2575-%Change-Post: 32 %
FEF2575-%Pred-Post: 61 %
FEF2575-%Pred-Pre: 46 %
FEV1-%Change-Post: 7 %
FEV1-%Pred-Post: 62 %
FEV1-%Pred-Pre: 58 %
FEV1-Post: 2.02 L
FEV1-Pre: 1.87 L
FEV1FVC-%Change-Post: 0 %
FEV1FVC-%Pred-Pre: 94 %
FEV6-%Change-Post: 8 %
FEV6-%Pred-Post: 69 %
FEV6-%Pred-Pre: 63 %
FEV6-Post: 2.88 L
FEV6-Pre: 2.64 L
FEV6FVC-%Change-Post: -1 %
FEV6FVC-%Pred-Post: 104 %
FEV6FVC-%Pred-Pre: 105 %
FVC-%Change-Post: 8 %
FVC-%Pred-Post: 66 %
FVC-%Pred-Pre: 61 %
FVC-Post: 2.93 L
FVC-Pre: 2.7 L
Post FEV1/FVC ratio: 69 %
Post FEV6/FVC ratio: 98 %
Pre FEV1/FVC ratio: 69 %
Pre FEV6/FVC Ratio: 99 %
RV % pred: 113 %
RV: 2.82 L
TLC % pred: 79 %
TLC: 5.56 L

## 2021-01-04 ENCOUNTER — Telehealth: Payer: Self-pay | Admitting: Emergency Medicine

## 2021-01-04 NOTE — Telephone Encounter (Signed)
Dr. Lamonte Sakai, please advise on results of pt's recent PFT.

## 2021-01-08 NOTE — Telephone Encounter (Signed)
Called and spoke with pt's spouse letting her know the info stated by TP and she verbalized  understanding. Pt's OV will be faxed to Dr. Kathlene Cote. Nothing further needed.

## 2021-01-08 NOTE — Telephone Encounter (Signed)
Called and spoke with pt's spouse Adonis Brook letting her know the results of pt's PFT and she verbalized understanding.  Adonis Brook wanted to know if pt would be okay to go through with having the surgery performed to remove a tumor that has been found on his kidney. Pt did have a recent visit with TP 8/12 and TP had requested the PFT to be performed.  Tammy, please advise.

## 2021-01-08 NOTE — Telephone Encounter (Signed)
Yes pulmonary risk assessment shows that patient is not excluded from surgery.  He is a moderate pulmonary surgical risk . Please see recommendations from previous note as I already addressed this regarding his surgical pulmonary risk.   He has stable COPD and asthma with moderate severe airflow obstruction.  This is stable from 2017. Chest x-ray showed stable COPD changes Patient is continue with office visit recommendations

## 2021-01-08 NOTE — Telephone Encounter (Signed)
Please let the patient know that I reviewed his pulmonary function testing.  He still has moderately severe abnormal airflow consistent with COPD/asthma.  His function is actually stable to slightly better than his prior from 2017.  Lung volumes show very mild restriction of his total lung capacity.

## 2021-01-09 ENCOUNTER — Other Ambulatory Visit: Payer: Self-pay | Admitting: Interventional Radiology

## 2021-01-10 ENCOUNTER — Other Ambulatory Visit (HOSPITAL_COMMUNITY): Payer: Self-pay | Admitting: Interventional Radiology

## 2021-01-10 DIAGNOSIS — C642 Malignant neoplasm of left kidney, except renal pelvis: Secondary | ICD-10-CM

## 2021-01-11 ENCOUNTER — Other Ambulatory Visit (HOSPITAL_COMMUNITY): Payer: Self-pay | Admitting: Interventional Radiology

## 2021-01-11 DIAGNOSIS — C642 Malignant neoplasm of left kidney, except renal pelvis: Secondary | ICD-10-CM

## 2021-01-18 ENCOUNTER — Encounter: Payer: Self-pay | Admitting: *Deleted

## 2021-01-18 ENCOUNTER — Ambulatory Visit
Admission: RE | Admit: 2021-01-18 | Discharge: 2021-01-18 | Disposition: A | Discharge status: Home / Self Care | Payer: Medicare Other | Source: Ambulatory Visit | Attending: Interventional Radiology | Admitting: Interventional Radiology

## 2021-01-18 ENCOUNTER — Other Ambulatory Visit: Payer: Self-pay

## 2021-01-18 DIAGNOSIS — C642 Malignant neoplasm of left kidney, except renal pelvis: Secondary | ICD-10-CM | POA: Diagnosis not present

## 2021-01-18 HISTORY — PX: IR RADIOLOGIST EVAL & MGMT: IMG5224

## 2021-01-18 NOTE — Progress Notes (Signed)
Interventional Radiology Progress Note  I spoke with [name] and his wife by phone. They had some questions about upcoming scheduled cryoablation of a biopsy-proven left clear-cell renal carcinoma on 02/14/2021.  Questions were answered with regard to the procedure which included questions about whether the mass was being removed or frozen and percent likelihood of only having to undergo one treatment.    He also states that he has not been feeling well recently with some worsening of chronic aching pain in both lower extremities, right greater than left.  He was evaluated in the emergency department on 12/10/2020. CT angiography of the abdominal aorta with bilateral iliofemoral runoff at that time does not demonstrate any significant arterial occlusive disease of either lower extremity.  Reconstructed imaging from that study to evaluate the lumbar spine demonstrates mild degenerative disc disease at multiple levels.  He cannot undergo MRI due to an indwelling pacemaker.  I told him that should his extremity symptoms remain significant or worsen prior to his scheduled cryoablation procedure that he consider consulting with his primary care physician for referral to a specialist.  Eulas Post T. Kathlene Cote, M.D Pager:  [phone]

## 2021-01-24 NOTE — Progress Notes (Signed)
Sent message, via epic in basket, requesting orders in epic from surgeon.  

## 2021-02-01 NOTE — Progress Notes (Signed)
Pt. Needs orders for upcomming procedure.PAT and labs on 02/02/21.

## 2021-02-01 NOTE — Patient Instructions (Signed)
DUE TO COVID-19 ONLY ONE VISITOR IS ALLOWED TO COME WITH YOU AND STAY IN THE WAITING ROOM ONLY DURING PRE OP AND PROCEDURE.   **NO VISITORS ARE ALLOWED IN THE SHORT STAY AREA OR RECOVERY ROOM!!**  IF YOU WILL BE ADMITTED INTO THE HOSPITAL YOU ARE ALLOWED ONLY TWO SUPPORT PEOPLE DURING VISITATION HOURS ONLY (10AM -8PM)   The support person(s) may change daily. The support person(s) must pass our screening, gel in and out, and wear a mask at all times, including in the patient's room. Patients must also wear a mask when staff or their support person are in the room.  No visitors under the age of 79. Any visitor under the age of 86 must be accompanied by an adult.    COVID SWAB TESTING MUST BE COMPLETED ON: 02/12/21  **MUST PRESENT COMPLETED FORM AT TESTING SITE**    [facility] (backside of the building) You are not required to quarantine, however you are required to wear a well-fitted mask when you are out and around people not in your household.  Hand Hygiene often Do NOT share personal items Notify your provider if you are in close contact with someone who has COVID or you develop fever 100.4 or greater, new onset of sneezing, cough, sore throat, shortness of breath or body aches.  [address], [address], must go inside of the hospital, NOT A DRIVE THRU!  (Must self quarantine after testing. Follow instructions on handout.)       Your procedure is scheduled on:    Report to [facility] Main Entrance    Report to admitting at : 10:00 AM   Call this number if you have problems the morning of surgery [phone]   Do not eat food :After Midnight.   May have liquids until : 8:45 AM   day of surgery  CLEAR LIQUID DIET  Foods Allowed                                                                     Foods Excluded  Water, Black Coffee and tea, regular and decaf                              liquids that you cannot  Plain Jell-O in any flavor  (No red)                                           see through such as: Fruit ices (not with fruit pulp)                                     milk, soups, orange juice              Iced Popsicles (No red)  All solid food                                   Apple juices Sports drinks like Gatorade (No red) Lightly seasoned clear broth or consume(fat free) Sugar,   Sample Menu Breakfast                                Lunch                                     Supper Cranberry juice                    Beef broth                            Chicken broth Jell-O                                     Grape juice                           Apple juice Coffee or tea                        Jell-O                                      Popsicle                                                Coffee or tea                        Coffee or tea     Oral Hygiene is also important to reduce your risk of infection.                                    Remember - BRUSH YOUR TEETH THE MORNING OF SURGERY WITH YOUR REGULAR TOOTHPASTE   Do NOT smoke after Midnight   Take these medicines the morning of surgery with A SIP OF WATER: bisoprolol,amlodipine,synthroid,pantoprazole,gabapentin,allegra.Use inhalers as usual.Lorazepam,Zofran and phenergan as needed.  How to Manage Your Diabetes Before and After Surgery  Why is it important to control my blood sugar before and after surgery? Improving blood sugar levels before and after surgery helps healing and can limit problems. A way of improving blood sugar control is eating a healthy diet by:  Eating less sugar and carbohydrates  Increasing activity/exercise  Talking with your doctor about reaching your blood sugar goals High blood sugars (greater than 180 mg/dL) can raise your risk of infections and slow your recovery, so you will need to focus on controlling your diabetes during the  weeks before surgery. Make sure that the doctor who takes care of your diabetes knows about your planned surgery including  the date and location.  How do I manage my blood sugar before surgery? Check your blood sugar at least 4 times a day, starting 2 days before surgery, to make sure that the level is not too high or low. Check your blood sugar the morning of your surgery when you wake up and every 2 hours until you get to the Short Stay unit. If your blood sugar is less than 70 mg/dL, you will need to treat for low blood sugar: Do not take insulin. Treat a low blood sugar (less than 70 mg/dL) with  cup of clear juice (cranberry or apple), 4 glucose tablets, OR glucose gel. Recheck blood sugar in 15 minutes after treatment (to make sure it is greater than 70 mg/dL). If your blood sugar is not greater than 70 mg/dL on recheck, call [phone] for further instructions. Report your blood sugar to the short stay nurse when you get to Short Stay.  If you are admitted to the hospital after surgery: Your blood sugar will be checked by the staff and you will probably be given insulin after surgery (instead of oral diabetes medicines) to make sure you have good blood sugar levels. The goal for blood sugar control after surgery is 80-180 mg/dL.   WHAT DO I DO ABOUT MY DIABETES MEDICATION?  Do not take oral diabetes medicines (pills) the morning of surgery.  THE DAY BEFORE SURGERY, DO NOT take Jardiance.     THE MORNING OF SURGERY, DO NOT TAKE ANY ORAL DIABETIC MEDICATIONS DAY OF YOUR SURGERY                              You may not have any metal on your body including hair pins, jewelry, and body piercing             Do not wear lotions, powders, perfumes/cologne, or deodorant              Men may shave face and neck.   Do not bring valuables to the hospital. [place].   Contacts, dentures or bridgework may not be worn into  surgery.   Bring small overnight bag day of surgery.    Patients discharged on the day of surgery will not be allowed to drive home.   Special Instructions: Bring a copy of your healthcare power of attorney and living will documents         the day of surgery if you haven't scanned them before.              Please read over the following fact sheets you were given: IF YOU HAVE QUESTIONS ABOUT YOUR PRE-OP INSTRUCTIONS PLEASE CALL [phone]   [facility] - Preparing for Surgery Before surgery, you can play an important role.  Because skin is not sterile, your skin needs to be as free of germs as possible.  You can reduce the number of germs on your skin by washing with CHG (chlorahexidine gluconate) soap before surgery.  CHG is an antiseptic cleaner which kills germs and bonds with the skin to continue killing germs even after washing. Please DO NOT use if you have an allergy to CHG or antibacterial soaps.  If your skin becomes reddened/irritated stop using the CHG and inform your nurse when you arrive at Short Stay. Do not shave (including legs and  underarms) for at least 48 hours prior to the first CHG shower.  You may shave your face/neck. Please follow these instructions carefully:  1.  Shower with CHG Soap the night before surgery and the  morning of Surgery.  2.  If you choose to wash your hair, wash your hair first as usual with your  normal  shampoo.  3.  After you shampoo, rinse your hair and body thoroughly to remove the  shampoo.                           4.  Use CHG as you would any other liquid soap.  You can apply chg directly  to the skin and wash                       Gently with a scrungie or clean washcloth.  5.  Apply the CHG Soap to your body ONLY FROM THE NECK DOWN.   Do not use on face/ open                           Wound or open sores. Avoid contact with eyes, ears mouth and genitals (private parts).                       Wash face,  Genitals (private parts) with your  normal soap.             6.  Wash thoroughly, paying special attention to the area where your surgery  will be performed.  7.  Thoroughly rinse your body with warm water from the neck down.  8.  DO NOT shower/wash with your normal soap after using and rinsing off  the CHG Soap.                9.  Pat yourself dry with a clean towel.            10.  Wear clean pajamas.            11.  Place clean sheets on your bed the night of your first shower and do not  sleep with pets. Day of Surgery : Do not apply any lotions/deodorants the morning of surgery.  Please wear clean clothes to the hospital/surgery center.  FAILURE TO FOLLOW THESE INSTRUCTIONS MAY RESULT IN THE CANCELLATION OF YOUR SURGERY PATIENT SIGNATURE_________________________________  NURSE SIGNATURE__________________________________  ________________________________________________________________________ [facility] - Preparing for Surgery Before surgery, you can play an important role.  Because skin is not sterile, your skin needs to be as free of germs as possible.  You can reduce the number of germs on your skin by washing with CHG (chlorahexidine gluconate) soap before surgery.  CHG is an antiseptic cleaner which kills germs and bonds with the skin to continue killing germs even after washing. Please DO NOT use if you have an allergy to CHG or antibacterial soaps.  If your skin becomes reddened/irritated stop using the CHG and inform your nurse when you arrive at Short Stay. Do not shave (including legs and underarms) for at least 48 hours prior to the first CHG shower.  You may shave your face/neck. Please follow these instructions carefully:  1.  Shower with CHG Soap the night before surgery and the  morning of Surgery.  2.  If you choose to wash your hair, wash your hair first as usual with your  normal  shampoo.  3.  After you shampoo, rinse your hair and body thoroughly to remove the  shampoo.                           4.  Use  CHG as you would any other liquid soap.  You can apply chg directly  to the skin and wash                       Gently with a scrungie or clean washcloth.  5.  Apply the CHG Soap to your body ONLY FROM THE NECK DOWN.   Do not use on face/ open                           Wound or open sores. Avoid contact with eyes, ears mouth and genitals (private parts).                       Wash face,  Genitals (private parts) with your normal soap.             6.  Wash thoroughly, paying special attention to the area where your surgery  will be performed.  7.  Thoroughly rinse your body with warm water from the neck down.  8.  DO NOT shower/wash with your normal soap after using and rinsing off  the CHG Soap.                9.  Pat yourself dry with a clean towel.            10.  Wear clean pajamas.            11.  Place clean sheets on your bed the night of your first shower and do not  sleep with pets. Day of Surgery : Do not apply any lotions/deodorants the morning of surgery.  Please wear clean clothes to the hospital/surgery center.  FAILURE TO FOLLOW THESE INSTRUCTIONS MAY RESULT IN THE CANCELLATION OF YOUR SURGERY PATIENT SIGNATURE_________________________________  NURSE SIGNATURE__________________________________  ________________________________________________________________________

## 2021-02-02 ENCOUNTER — Encounter (HOSPITAL_COMMUNITY)
Admission: RE | Admit: 2021-02-02 | Discharge: 2021-02-02 | Disposition: A | Discharge status: Home / Self Care | Payer: Medicare Other | Source: Ambulatory Visit | Attending: Interventional Radiology | Admitting: Interventional Radiology

## 2021-02-02 ENCOUNTER — Encounter (HOSPITAL_COMMUNITY): Payer: Self-pay

## 2021-02-02 ENCOUNTER — Other Ambulatory Visit: Payer: Self-pay

## 2021-02-02 ENCOUNTER — Encounter: Payer: Self-pay | Admitting: Internal Medicine

## 2021-02-02 DIAGNOSIS — J449 Chronic obstructive pulmonary disease, unspecified: Secondary | ICD-10-CM | POA: Diagnosis not present

## 2021-02-02 DIAGNOSIS — N189 Chronic kidney disease, unspecified: Secondary | ICD-10-CM | POA: Diagnosis not present

## 2021-02-02 DIAGNOSIS — C642 Malignant neoplasm of left kidney, except renal pelvis: Secondary | ICD-10-CM | POA: Diagnosis not present

## 2021-02-02 DIAGNOSIS — Z01812 Encounter for preprocedural laboratory examination: Secondary | ICD-10-CM | POA: Insufficient documentation

## 2021-02-02 DIAGNOSIS — I4891 Unspecified atrial fibrillation: Secondary | ICD-10-CM | POA: Diagnosis not present

## 2021-02-02 DIAGNOSIS — Z79899 Other long term (current) drug therapy: Secondary | ICD-10-CM | POA: Diagnosis not present

## 2021-02-02 DIAGNOSIS — E1122 Type 2 diabetes mellitus with diabetic chronic kidney disease: Secondary | ICD-10-CM | POA: Diagnosis not present

## 2021-02-02 DIAGNOSIS — K219 Gastro-esophageal reflux disease without esophagitis: Secondary | ICD-10-CM | POA: Insufficient documentation

## 2021-02-02 DIAGNOSIS — Z950 Presence of cardiac pacemaker: Secondary | ICD-10-CM | POA: Diagnosis not present

## 2021-02-02 DIAGNOSIS — Z7984 Long term (current) use of oral hypoglycemic drugs: Secondary | ICD-10-CM | POA: Diagnosis not present

## 2021-02-02 DIAGNOSIS — Z7901 Long term (current) use of anticoagulants: Secondary | ICD-10-CM | POA: Diagnosis not present

## 2021-02-02 DIAGNOSIS — I130 Hypertensive heart and chronic kidney disease with heart failure and stage 1 through stage 4 chronic kidney disease, or unspecified chronic kidney disease: Secondary | ICD-10-CM | POA: Diagnosis not present

## 2021-02-02 DIAGNOSIS — I350 Nonrheumatic aortic (valve) stenosis: Secondary | ICD-10-CM | POA: Insufficient documentation

## 2021-02-02 DIAGNOSIS — Z9981 Dependence on supplemental oxygen: Secondary | ICD-10-CM | POA: Insufficient documentation

## 2021-02-02 DIAGNOSIS — Z87891 Personal history of nicotine dependence: Secondary | ICD-10-CM | POA: Insufficient documentation

## 2021-02-02 DIAGNOSIS — I509 Heart failure, unspecified: Secondary | ICD-10-CM | POA: Diagnosis not present

## 2021-02-02 LAB — CBC
HCT: 44.9 % (ref 39.0–52.0)
Hemoglobin: 13.9 g/dL (ref 13.0–17.0)
MCH: 29 pg (ref 26.0–34.0)
MCHC: 31 g/dL (ref 30.0–36.0)
MCV: 93.7 fL (ref 80.0–100.0)
Platelets: 153 10*3/uL (ref 150–400)
RBC: 4.79 MIL/uL (ref 4.22–5.81)
RDW: 16.8 % — ABNORMAL HIGH (ref 11.5–15.5)
WBC: 9 10*3/uL (ref 4.0–10.5)
nRBC: 0 % (ref 0.0–0.2)

## 2021-02-02 LAB — BASIC METABOLIC PANEL
Anion gap: 9 (ref 5–15)
BUN: 28 mg/dL — ABNORMAL HIGH (ref 8–23)
CO2: 28 mmol/L (ref 22–32)
Calcium: 9.4 mg/dL (ref 8.9–10.3)
Chloride: 100 mmol/L (ref 98–111)
Creatinine, Ser: 2.24 mg/dL — ABNORMAL HIGH (ref 0.61–1.24)
GFR, Estimated: 31 mL/min — ABNORMAL LOW (ref >60)
Glucose, Bld: 120 mg/dL — ABNORMAL HIGH (ref 70–99)
Potassium: 4.2 mmol/L (ref 3.5–5.1)
Sodium: 137 mmol/L (ref 135–145)

## 2021-02-02 LAB — GLUCOSE, CAPILLARY: Glucose-Capillary: 129 mg/dL — ABNORMAL HIGH (ref 70–99)

## 2021-02-02 LAB — HEMOGLOBIN A1C
Hgb A1c MFr Bld: 6.7 % — ABNORMAL HIGH (ref 4.8–5.6)
Mean Plasma Glucose: 145.59 mg/dL

## 2021-02-02 NOTE — Progress Notes (Signed)
[place] DEVICE PROGRAMMING  Patient Information: Name:  [name]  DOB:  [birth year]  MRN:  [id number]     Planned Procedure: Left renal cryoablation.  Surgeon:  Dr. Kathlene Cote  Date of Procedure:  02/14/21  Cautery will be used.  Position during surgery:     Please send documentation back to:  [facility] (Fax # [phone])                                                         Device Information:  Clinic EP Physician:  Thompson Grayer, MD   Device Type:  Pacemaker  [place] [employer] MRI Manufacturer and Phone #:  [employer]: [phone] Pacemaker Dependent?:  Yes.   Date of Last Device Check:  12/22/20 in clinic Normal Device Function?:  Yes.    Electrophysiologist's Recommendations:  Have magnet available. Provide continuous ECG monitoring when magnet is used or reprogramming is to be performed.  Procedure may interfere with device function.  Magnet should be placed over device during procedure.  Per Device Clinic Standing Orders, [name], [place]  10:03 AM 02/02/2021

## 2021-02-02 NOTE — Progress Notes (Signed)
Lab. Results: Creatinine: 2.24

## 2021-02-02 NOTE — Progress Notes (Signed)
COVID Vaccine Completed: NO Date COVID Vaccine completed: COVID vaccine manufacturer: [place] Test: 02/12/21  PCP - Dr. Velna Hatchet Cardiologist - Dr. Quillian Quince Bensimhon./Dr. Jeneen Rinks Allred: LOV: 12/22/20.  Chest x-ray - 12/10/20 EKG - 12/24/20 Stress Test -  ECHO -  07/12/20 Cardiac Cath -  Pacemaker/ICD device last checked: 12/24/20  Sleep Study - Yes CPAP - NO  Fasting Blood Sugar -  Checks Blood Sugar _____ times a day  Blood Thinner Instructions: Eliquis will be held 2 days before procedure as per Dr. Kathlene Cote instructions. Aspirin Instructions: Last Dose:  Anesthesia review: Hx: DIA,Afib,CHF,COPD,HTN,OSA(NO CPAP),Second degree AV block,Pacemaker.  Patient denies shortness of breath, fever, cough and chest pain at PAT appointment   Patient verbalized understanding of instructions that were given to them at the PAT appointment. Patient was also instructed that they will need to review over the PAT instructions again at home before surgery.

## 2021-02-07 ENCOUNTER — Ambulatory Visit (INDEPENDENT_AMBULATORY_CARE_PROVIDER_SITE_OTHER): Payer: Medicare Other

## 2021-02-07 DIAGNOSIS — I442 Atrioventricular block, complete: Secondary | ICD-10-CM

## 2021-02-07 NOTE — Anesthesia Preprocedure Evaluation (Addendum)
Anesthesia Evaluation  Patient identified by MRN, date of birth, ID band Patient awake    Reviewed: Allergy & Precautions, NPO status , Patient's Chart, lab work & pertinent test results  Airway Mallampati: II  TM Distance: >3 FB     Dental   Pulmonary shortness of breath, asthma , sleep apnea , COPD, former smoker,    breath sounds clear to auscultation       Cardiovascular hypertension, + CAD and +CHF  + dysrhythmias + pacemaker  Rhythm:Regular Rate:Normal     Neuro/Psych  Neuromuscular disease    GI/Hepatic   Endo/Other  diabetesHypothyroidism   Renal/GU Renal disease     Musculoskeletal  (+) Arthritis , Fibromyalgia -  Abdominal   Peds  Hematology   Anesthesia Other Findings   Reproductive/Obstetrics                           Anesthesia Physical Anesthesia Plan  ASA: 3  Anesthesia Plan: General   Post-op Pain Management:    Induction: Intravenous  PONV Risk Score and Plan: 3 and Treatment may vary due to age or medical condition, Ondansetron and Dexamethasone  Airway Management Planned:   Additional Equipment:   Intra-op Plan:   Post-operative Plan: Extubation in OR  Informed Consent: I have reviewed the patients History and Physical, chart, labs and discussed the procedure including the risks, benefits and alternatives for the proposed anesthesia with the patient or authorized representative who has indicated his/her understanding and acceptance.     Dental advisory given  Plan Discussed with: CRNA and Anesthesiologist  Anesthesia Plan Comments: (See PAT note 02/02/2021, Konrad Felix Ward, PA-C)       Anesthesia Quick Evaluation

## 2021-02-07 NOTE — Progress Notes (Signed)
Anesthesia Chart Review   Case: [id number] Date/Time: 02/14/21 1130   Procedure: RADIOLOGY WITH ANESTHESIA LEFT RENAL CRYOABLATION (Left)   Anesthesia type: General   Pre-op diagnosis: RENAL CELL CARCINOMA   [facility]   Surgeons: Aletta Edouard, MD       DISCUSSION:70 y.o. former smoker with h/o HTN, asthma, GERD, OSA, DM II, COPD, CHF, a-fib s/p MAZE (on Eliquis), CHB s/p pacemaker in place (device orders in 02/02/2021 progress note), mild aortic stenosis, CKD follows with nephrology, renal cell carcinoma scheduled for above procedure 02/14/2021 with Dr. Aletta Edouard.   Per pulmonology note 01/08/2021, "Yes pulmonary risk assessment shows that patient is not excluded from surgery.  He is a moderate pulmonary surgical risk . Please see recommendations from previous note as I already addressed this regarding his surgical pulmonary risk.   He has stable COPD and asthma with moderate severe airflow obstruction.  This is stable from 2017. Chest x-ray showed stable COPD changes"  Pt last seen by cardiology 12/22/2020. Per OV note pt stable with 6 month follow up.   Pt advised to hold Eliquis 2 days prior to procedure.   Anticipate pt can proceed with planned procedure barring acute status change.   VS: BP 128/65   Pulse (!) 57   Temp 36.6 C (Oral)   Ht 5\' 11"  (1.803 m)   Wt 85.7 kg   SpO2 99%   BMI 26.36 kg/m   PROVIDERS: Velna Hatchet, MD is PCP   Glori Bickers, MD is Cardiologist   Thompson Grayer, MD is EP  Baltazar Apo, MD is Pulmonologist  LABS: Labs reviewed: Acceptable for surgery. (all labs ordered are listed, but only abnormal results are displayed)  Labs Reviewed  CBC - Abnormal; Notable for the following components:      Result Value   RDW 16.8 (*)    All other components within normal limits  BASIC METABOLIC PANEL - Abnormal; Notable for the following components:   Glucose, Bld 120 (*)    BUN 28 (*)    Creatinine, Ser 2.24 (*)    GFR,  Estimated 31 (*)    All other components within normal limits  HEMOGLOBIN A1C - Abnormal; Notable for the following components:   Hgb A1c MFr Bld 6.7 (*)    All other components within normal limits  GLUCOSE, CAPILLARY - Abnormal; Notable for the following components:   Glucose-Capillary 129 (*)    All other components within normal limits     IMAGES:   EKG: 12/22/2020 Rate 60 bpm  A-fib  CV: Echo 07/12/2020  1. Left ventricular ejection fraction, by estimation, is 55 to 60%. The  left ventricle has normal function. The left ventricle has no regional  wall motion abnormalities. Left ventricular diastolic parameters are  consistent with Grade III diastolic  dysfunction (restrictive).   2. Right ventricular systolic function is normal. The right ventricular  size is mildly enlarged. There is mildly elevated pulmonary artery  systolic pressure.   3. Left atrial size was moderately dilated.   4. Right atrial size was moderately dilated.   5. The mitral valve is normal in structure. Mild mitral valve  regurgitation.   6. TR signal insufficient to estimate RVSP accurately.   7. Normal functionin AoV bioprosthesis.      . The aortic valve has been repaired/replaced. There is mild  calcification of the aortic valve. Aortic valve regurgitation is not  visualized. There is a bioprosthetic valve present in the aortic position.  Procedure Date: 08/22/2015.   8. Aortic dilatation noted. There is mild dilatation of the ascending  aorta, measuring 42 mm.  Past Medical History:  Diagnosis Date   Aortic stenosis, mild    Arthritis of hand    "just a little bit in both hands" (03/31/2012)   Asthma    "little bit" (03/31/2012)   Atrial fibrillation ([place])    dx '04; DCCV '04, placed on flecainide, failed DCCV 04/2010, flecainide stopped; s/p successful A.fib ablation 01/31/12   CHF (congestive heart failure) ([place])    Controlled type 2 diabetes mellitus without complication, without long-term  current use of insulin ([place]) 12/13/2019   COPD (chronic obstructive pulmonary disease) ([facility])    DJD (degenerative joint disease)    Fibromyalgia    GERD (gastroesophageal reflux disease)    Hyperlipidemia    Hypertension    Hypothyroidism    S/P radiation   Left atrial enlargement    LA size 83mm by echo 11/21/11   Mitral regurgitation    trivial   Obstructive sleep apnea    mild by sleep study 2013; pt stated he does not have a machine because "it wasn't bad enough for him to have one"   Pacemaker 03/31/2012   Restless leg syndrome    Second degree AV block    Splenomegaly    Visit for monitoring Tikosyn therapy 04/16/2016    Past Surgical History:  Procedure Laterality Date   AORTIC VALVE REPLACEMENT N/A 08/22/2015   Procedure: AORTIC VALVE REPLACEMENT (AVR);  Surgeon: Ivin Poot, MD;  Location: [place];  Service: Open Heart Surgery;  Laterality: N/A;   ATRIAL FIBRILLATION ABLATION  01/30/2012   PVI by Dr. Rayann Heman   ATRIAL FIBRILLATION ABLATION N/A 01/31/2012   Procedure: ATRIAL FIBRILLATION ABLATION;  Surgeon: Thompson Grayer, MD;  Location: [facility] CATH LAB;  Service: Cardiovascular;  Laterality: N/A;   ATRIAL FIBRILLATION ABLATION N/A 04/19/2020   Procedure: ATRIAL FIBRILLATION ABLATION;  Surgeon: Thompson Grayer, MD;  Location: [place] CV LAB;  Service: Cardiovascular;  Laterality: N/A;   BACK SURGERY     X 3   CARDIAC CATHETERIZATION N/A 08/09/2015   Procedure: Right/Left Heart Cath and Coronary Angiography;  Surgeon: Peter M Martinique, MD;  Location: [place] CV LAB;  Service: Cardiovascular;  Laterality: N/A;   CARDIAC CATHETERIZATION N/A 02/05/2016   Procedure: Right/Left Heart Cath and Coronary Angiography;  Surgeon: Jettie Booze, MD;  Location: [place] CV LAB;  Service: Cardiovascular;  Laterality: N/A;   CARDIAC CATHETERIZATION N/A 04/17/2016   Procedure: Right Heart Cath;  Surgeon: Jolaine Artist, MD;  Location: [place] CV LAB;  Service: Cardiovascular;   Laterality: N/A;   CARDIOVASCULAR STRESS TEST  03/12/2002   EF 48%, NO EVIDENCE OF ISCHEMIA   CARDIOVERSION  01/2012; 03/31/2012   CARDIOVERSION N/A 02/25/2012   Procedure: CARDIOVERSION;  Surgeon: Thompson Grayer, MD;  Location: [facility] CATH LAB;  Service: Cardiovascular;  Laterality: N/A;   CARDIOVERSION N/A 03/31/2012   Procedure: CARDIOVERSION;  Surgeon: Thompson Grayer, MD;  Location: [facility] CATH LAB;  Service: Cardiovascular;  Laterality: N/A;   CARDIOVERSION N/A 11/23/2015   Procedure: CARDIOVERSION;  Surgeon: Larey Dresser, MD;  Location: [facility] ENDOSCOPY;  Service: Cardiovascular;  Laterality: N/A;   CARDIOVERSION N/A 04/08/2016   Procedure: CARDIOVERSION;  Surgeon: Jolaine Artist, MD;  Location: [facility] ENDOSCOPY;  Service: Cardiovascular;  Laterality: N/A;   CARDIOVERSION N/A 09/14/2018   Procedure: CARDIOVERSION;  Surgeon: Josue Hector, MD;  Location: [place];  Service: Cardiovascular;  Laterality: N/A;   CARDIOVERSION N/A 10/19/2019   Procedure: CARDIOVERSION;  Surgeon: Jolaine Artist, MD;  Location: [facility] ENDOSCOPY;  Service: Cardiovascular;  Laterality: N/A;   CARDIOVERSION N/A 02/25/2020   Procedure: CARDIOVERSION;  Surgeon: Fay Records, MD;  Location: [place];  Service: Cardiovascular;  Laterality: N/A;   CARDIOVERSION N/A 05/25/2020   Procedure: CARDIOVERSION;  Surgeon: Lelon Perla, MD;  Location: [facility] ENDOSCOPY;  Service: Cardiovascular;  Laterality: N/A;   CARDIOVERSION N/A 07/10/2020   Procedure: CARDIOVERSION;  Surgeon: Jerline Pain, MD;  Location: [place];  Service: Cardiovascular;  Laterality: N/A;   CLIPPING OF ATRIAL APPENDAGE N/A 08/22/2015   Procedure: CLIPPING OF ATRIAL APPENDAGE;  Surgeon: Ivin Poot, MD;  Location: [place];  Service: Open Heart Surgery;  Laterality: N/A;   DOPPLER ECHOCARDIOGRAPHY  03/11/2002   EF 70-75%   FINGER SURGERY Left    Middle finger   FINGER TENDON REPAIR  1980's   "right little finger" (03/31/2012)   INSERT / REPLACE /  REMOVE PACEMAKER     [employer]   IR RADIOLOGIST EVAL & MGMT  05/06/2019   IR RADIOLOGIST EVAL & MGMT  10/06/2019   IR RADIOLOGIST EVAL & MGMT  10/31/2020   IR RADIOLOGIST EVAL & MGMT  01/18/2021   LEFT AND RIGHT HEART CATHETERIZATION WITH CORONARY ANGIOGRAM N/A 10/27/2012   Procedure: LEFT AND RIGHT HEART CATHETERIZATION WITH CORONARY ANGIOGRAM;  Surgeon: Laverda Page, MD;  Location: [facility] CATH LAB;  Service: Cardiovascular;  Laterality: N/A;   LUMBAR DISC SURGERY  1980's X2;  2000's   MAZE N/A 08/22/2015   Procedure: MAZE;  Surgeon: Ivin Poot, MD;  Location: [place];  Service: Open Heart Surgery;  Laterality: N/A;   PACEMAKER INSERTION  03/31/2012   STJ Accent DR pacemaker implanted by Dr Rayann Heman   PERMANENT PACEMAKER INSERTION N/A 03/31/2012   Procedure: PERMANENT PACEMAKER INSERTION;  Surgeon: Thompson Grayer, MD;  Location: [facility] CATH LAB;  Service: Cardiovascular;  Laterality: N/A;   PPM GENERATOR CHANGEOUT N/A 08/08/2020   Procedure: PPM GENERATOR CHANGEOUT;  Surgeon: Thompson Grayer, MD;  Location: [place] CV LAB;  Service: Cardiovascular;  Laterality: N/A;   TEE WITHOUT CARDIOVERSION  01/30/2012   Procedure: TRANSESOPHAGEAL ECHOCARDIOGRAM (TEE);  Surgeon: Larey Dresser, MD;  Location: [place];  Service: Cardiovascular;  Laterality: N/A;  ablation next day   TEE WITHOUT CARDIOVERSION N/A 08/22/2015   Procedure: TRANSESOPHAGEAL ECHOCARDIOGRAM (TEE);  Surgeon: Ivin Poot, MD;  Location: [place];  Service: Open Heart Surgery;  Laterality: N/A;   TEE WITHOUT CARDIOVERSION N/A 10/19/2019   Procedure: TRANSESOPHAGEAL ECHOCARDIOGRAM (TEE);  Surgeon: Jolaine Artist, MD;  Location: [facility] ENDOSCOPY;  Service: Cardiovascular;  Laterality: N/A;   US ECHOCARDIOGRAPHY  08/07/2009   EF 55-60%    MEDICATIONS:  albuterol (PROVENTIL HFA;VENTOLIN HFA) 108 (90 Base) MCG/ACT inhaler   amLODipine (NORVASC) 10 MG tablet   apixaban (ELIQUIS) 5 MG TABS tablet   bisoprolol (ZEBETA) 5 MG tablet    Budeson-Glycopyrrol-Formoterol (BREZTRI AEROSPHERE) 160-9-4.8 MCG/ACT AERO   Calcium-Magnesium-Zinc (CAL-MAG-ZINC PO)   cholecalciferol (VITAMIN D3) 25 MCG (1000 UNIT) tablet   empagliflozin (JARDIANCE) 10 MG TABS tablet   fexofenadine (ALLEGRA) 180 MG tablet   gabapentin (NEURONTIN) 300 MG capsule   levothyroxine (SYNTHROID, LEVOTHROID) 175 MCG tablet   LORazepam (ATIVAN) 1 MG tablet   Menthol-Camphor (TIGER BALM ARTHRITIS RUB EX)   montelukast (SINGULAIR) 10 MG tablet   Multiple Vitamin (MULTIVITAMIN WITH MINERALS) TABS tablet   ondansetron (ZOFRAN-ODT) 4  MG disintegrating tablet   oxyCODONE-acetaminophen (PERCOCET/ROXICET) 5-325 MG tablet   OXYGEN   pantoprazole (PROTONIX) 40 MG tablet   polyethylene glycol powder (GLYCOLAX/MIRALAX) 17 GM/SCOOP powder   Polyvinyl Alcohol-Povidone (REFRESH OP)   potassium chloride SA (KLOR-CON) 20 MEQ tablet   predniSONE (DELTASONE) 10 MG tablet   predniSONE (DELTASONE) 5 MG tablet   promethazine (PHENERGAN) 25 MG tablet   protein supplement shake (PREMIER PROTEIN) LIQD   rOPINIRole (REQUIP) 1 MG tablet   rosuvastatin (CRESTOR) 10 MG tablet   spironolactone (ALDACTONE) 25 MG tablet   torsemide (DEMADEX) 20 MG tablet   traMADol (ULTRAM) 50 MG tablet   traZODone (DESYREL) 50 MG tablet   No current facility-administered medications for this encounter.     Konrad Felix Ward, PA-C [facility] [phone]

## 2021-02-08 LAB — CUP PACEART REMOTE DEVICE CHECK
Battery Remaining Longevity: 137 mo
Battery Remaining Percentage: 95.5 %
Battery Voltage: 3.02 V
Brady Statistic RV Percent Paced: 99 %
Date Time Interrogation Session: 20221012020013
Implantable Lead Implant Date: 20131203
Implantable Lead Implant Date: 20131203
Implantable Lead Location: 753859
Implantable Lead Location: 753860
Implantable Lead Model: 1948
Implantable Pulse Generator Implant Date: 20220412
Lead Channel Impedance Value: 680 Ohm
Lead Channel Pacing Threshold Amplitude: 1 V
Lead Channel Pacing Threshold Pulse Width: 0.5 ms
Lead Channel Sensing Intrinsic Amplitude: 7.5 mV
Lead Channel Setting Pacing Amplitude: 1.25 V
Lead Channel Setting Pacing Pulse Width: 0.5 ms
Lead Channel Setting Sensing Sensitivity: 4 mV
Pulse Gen Model: 2272
Pulse Gen Serial Number: 3919039

## 2021-02-13 ENCOUNTER — Other Ambulatory Visit: Payer: Self-pay | Admitting: Radiology

## 2021-02-13 ENCOUNTER — Other Ambulatory Visit (HOSPITAL_COMMUNITY): Payer: Self-pay | Admitting: Physician Assistant

## 2021-02-13 ENCOUNTER — Other Ambulatory Visit (HOSPITAL_COMMUNITY): Payer: Self-pay | Admitting: Interventional Radiology

## 2021-02-13 LAB — SARS CORONAVIRUS 2 (TAT 6-24 HRS): SARS Coronavirus 2: NEGATIVE

## 2021-02-14 ENCOUNTER — Encounter (HOSPITAL_COMMUNITY)
Admission: RE | Disposition: A | Discharge status: Home / Self Care | Payer: Self-pay | Source: Home / Self Care | Attending: Interventional Radiology

## 2021-02-14 ENCOUNTER — Encounter (HOSPITAL_COMMUNITY): Payer: Self-pay | Admitting: Interventional Radiology

## 2021-02-14 ENCOUNTER — Observation Stay (HOSPITAL_COMMUNITY)
Admission: RE | Admit: 2021-02-14 | Discharge: 2021-02-15 | Disposition: A | Discharge status: Home / Self Care | Payer: Medicare Other | Attending: Interventional Radiology | Admitting: Interventional Radiology

## 2021-02-14 ENCOUNTER — Other Ambulatory Visit: Payer: Self-pay

## 2021-02-14 ENCOUNTER — Ambulatory Visit (HOSPITAL_COMMUNITY)
Admission: RE | Admit: 2021-02-14 | Discharge: 2021-02-14 | Disposition: A | Discharge status: Home / Self Care | Payer: Medicare Other | Source: Ambulatory Visit | Attending: Interventional Radiology | Admitting: Interventional Radiology

## 2021-02-14 ENCOUNTER — Ambulatory Visit (HOSPITAL_COMMUNITY): Payer: Medicare Other | Admitting: Anesthesiology

## 2021-02-14 ENCOUNTER — Ambulatory Visit (HOSPITAL_COMMUNITY): Payer: Medicare Other | Admitting: Physician Assistant

## 2021-02-14 ENCOUNTER — Encounter (HOSPITAL_COMMUNITY): Payer: Self-pay

## 2021-02-14 DIAGNOSIS — I509 Heart failure, unspecified: Secondary | ICD-10-CM | POA: Insufficient documentation

## 2021-02-14 DIAGNOSIS — Z9889 Other specified postprocedural states: Secondary | ICD-10-CM

## 2021-02-14 DIAGNOSIS — I110 Hypertensive heart disease with heart failure: Secondary | ICD-10-CM | POA: Diagnosis not present

## 2021-02-14 DIAGNOSIS — Z87891 Personal history of nicotine dependence: Secondary | ICD-10-CM | POA: Insufficient documentation

## 2021-02-14 DIAGNOSIS — Z79899 Other long term (current) drug therapy: Secondary | ICD-10-CM | POA: Diagnosis not present

## 2021-02-14 DIAGNOSIS — J45909 Unspecified asthma, uncomplicated: Secondary | ICD-10-CM | POA: Diagnosis not present

## 2021-02-14 DIAGNOSIS — J449 Chronic obstructive pulmonary disease, unspecified: Secondary | ICD-10-CM | POA: Insufficient documentation

## 2021-02-14 DIAGNOSIS — J441 Chronic obstructive pulmonary disease with (acute) exacerbation: Secondary | ICD-10-CM | POA: Diagnosis not present

## 2021-02-14 DIAGNOSIS — C642 Malignant neoplasm of left kidney, except renal pelvis: Secondary | ICD-10-CM

## 2021-02-14 DIAGNOSIS — Z7901 Long term (current) use of anticoagulants: Secondary | ICD-10-CM | POA: Insufficient documentation

## 2021-02-14 DIAGNOSIS — E119 Type 2 diabetes mellitus without complications: Secondary | ICD-10-CM | POA: Diagnosis not present

## 2021-02-14 DIAGNOSIS — C649 Malignant neoplasm of unspecified kidney, except renal pelvis: Principal | ICD-10-CM | POA: Insufficient documentation

## 2021-02-14 DIAGNOSIS — Z23 Encounter for immunization: Secondary | ICD-10-CM | POA: Diagnosis not present

## 2021-02-14 DIAGNOSIS — N289 Disorder of kidney and ureter, unspecified: Secondary | ICD-10-CM | POA: Insufficient documentation

## 2021-02-14 DIAGNOSIS — E039 Hypothyroidism, unspecified: Secondary | ICD-10-CM | POA: Diagnosis not present

## 2021-02-14 DIAGNOSIS — I4819 Other persistent atrial fibrillation: Secondary | ICD-10-CM | POA: Diagnosis not present

## 2021-02-14 DIAGNOSIS — I483 Typical atrial flutter: Secondary | ICD-10-CM | POA: Diagnosis not present

## 2021-02-14 DIAGNOSIS — Z950 Presence of cardiac pacemaker: Secondary | ICD-10-CM | POA: Diagnosis not present

## 2021-02-14 HISTORY — PX: RADIOLOGY WITH ANESTHESIA: SHX6223

## 2021-02-14 LAB — CBC WITH DIFFERENTIAL/PLATELET
Abs Immature Granulocytes: 0.05 10*3/uL (ref 0.00–0.07)
Basophils Absolute: 0.1 10*3/uL (ref 0.0–0.1)
Basophils Relative: 1 %
Eosinophils Absolute: 0.1 10*3/uL (ref 0.0–0.5)
Eosinophils Relative: 1 %
HCT: 44.4 % (ref 39.0–52.0)
Hemoglobin: 14.2 g/dL (ref 13.0–17.0)
Immature Granulocytes: 1 %
Lymphocytes Relative: 10 %
Lymphs Abs: 1 10*3/uL (ref 0.7–4.0)
MCH: 29.2 pg (ref 26.0–34.0)
MCHC: 32 g/dL (ref 30.0–36.0)
MCV: 91.2 fL (ref 80.0–100.0)
Monocytes Absolute: 0.9 10*3/uL (ref 0.1–1.0)
Monocytes Relative: 9 %
Neutro Abs: 7.6 10*3/uL (ref 1.7–7.7)
Neutrophils Relative %: 78 %
Platelets: 180 10*3/uL (ref 150–400)
RBC: 4.87 MIL/uL (ref 4.22–5.81)
RDW: 16.8 % — ABNORMAL HIGH (ref 11.5–15.5)
WBC: 9.7 10*3/uL (ref 4.0–10.5)
nRBC: 0 % (ref 0.0–0.2)

## 2021-02-14 LAB — GLUCOSE, CAPILLARY
Glucose-Capillary: 122 mg/dL — ABNORMAL HIGH (ref 70–99)
Glucose-Capillary: 128 mg/dL — ABNORMAL HIGH (ref 70–99)
Glucose-Capillary: 210 mg/dL — ABNORMAL HIGH (ref 70–99)
Glucose-Capillary: 133 mg/dL — ABNORMAL HIGH (ref 70–99)

## 2021-02-14 LAB — TYPE AND SCREEN
ABO/RH(D): O POS
Antibody Screen: NEGATIVE

## 2021-02-14 LAB — BASIC METABOLIC PANEL
Anion gap: 9 (ref 5–15)
BUN: 31 mg/dL — ABNORMAL HIGH (ref 8–23)
CO2: 26 mmol/L (ref 22–32)
Calcium: 9.1 mg/dL (ref 8.9–10.3)
Chloride: 102 mmol/L (ref 98–111)
Creatinine, Ser: 1.82 mg/dL — ABNORMAL HIGH (ref 0.61–1.24)
GFR, Estimated: 39 mL/min — ABNORMAL LOW (ref >60)
Glucose, Bld: 125 mg/dL — ABNORMAL HIGH (ref 70–99)
Potassium: 3.9 mmol/L (ref 3.5–5.1)
Sodium: 137 mmol/L (ref 135–145)

## 2021-02-14 LAB — PROTIME-INR
INR: 1.1 (ref 0.8–1.2)
Prothrombin Time: 14.7 seconds (ref 11.4–15.2)

## 2021-02-14 SURGERY — RADIOLOGY WITH ANESTHESIA
Anesthesia: General | Laterality: Left

## 2021-02-14 MED ORDER — INSULIN ASPART 100 UNIT/ML IJ SOLN
0.0000 [IU] | Freq: Three times a day (TID) | INTRAMUSCULAR | Status: DC
  Administered 2021-02-14: 5 [IU] via SUBCUTANEOUS

## 2021-02-14 MED ORDER — PROPOFOL 10 MG/ML IV BOLUS
INTRAVENOUS | Status: DC | PRN
  Administered 2021-02-14: 150 mg via INTRAVENOUS

## 2021-02-14 MED ORDER — FENTANYL CITRATE PF 50 MCG/ML IJ SOSY
25.0000 ug | PREFILLED_SYRINGE | INTRAMUSCULAR | Status: DC | PRN
  Administered 2021-02-14 (×2): 50 ug via INTRAVENOUS

## 2021-02-14 MED ORDER — ALBUTEROL SULFATE (2.5 MG/3ML) 0.083% IN NEBU: 2.5000 mg | INHALATION_SOLUTION | Freq: Four times a day (QID) | RESPIRATORY_TRACT | Status: DC | PRN

## 2021-02-14 MED ORDER — SENNOSIDES-DOCUSATE SODIUM 8.6-50 MG PO TABS: 1.0000 | ORAL_TABLET | Freq: Every day | ORAL | Status: DC | PRN

## 2021-02-14 MED ORDER — CEFAZOLIN SODIUM-DEXTROSE 2-4 GM/100ML-% IV SOLN
2.0000 g | Freq: Once | INTRAVENOUS | Status: AC
  Administered 2021-02-14: 2 g via INTRAVENOUS
  Filled 2021-02-14: qty 100

## 2021-02-14 MED ORDER — MOMETASONE FURO-FORMOTEROL FUM 100-5 MCG/ACT IN AERO
2.0000 | INHALATION_SPRAY | Freq: Two times a day (BID) | RESPIRATORY_TRACT | Status: DC
  Administered 2021-02-14 – 2021-02-15 (×2): 2 via RESPIRATORY_TRACT
  Filled 2021-02-14: qty 8.8

## 2021-02-14 MED ORDER — AMLODIPINE BESYLATE 5 MG PO TABS
5.0000 mg | ORAL_TABLET | Freq: Every day | ORAL | Status: DC
  Administered 2021-02-15: 5 mg via ORAL
  Filled 2021-02-14 (×2): qty 1

## 2021-02-14 MED ORDER — TORSEMIDE 20 MG PO TABS
30.0000 mg | ORAL_TABLET | ORAL | Status: DC
  Administered 2021-02-15: 30 mg via ORAL
  Filled 2021-02-14: qty 1

## 2021-02-14 MED ORDER — BUDESON-GLYCOPYRROL-FORMOTEROL 160-9-4.8 MCG/ACT IN AERO: 2.0000 | INHALATION_SPRAY | Freq: Two times a day (BID) | RESPIRATORY_TRACT | Status: DC

## 2021-02-14 MED ORDER — ALBUTEROL SULFATE HFA 108 (90 BASE) MCG/ACT IN AERS: 2.0000 | INHALATION_SPRAY | Freq: Four times a day (QID) | RESPIRATORY_TRACT | Status: DC | PRN

## 2021-02-14 MED ORDER — ORAL CARE MOUTH RINSE: 15.0000 mL | Freq: Once | OROMUCOSAL | Status: AC

## 2021-02-14 MED ORDER — PANTOPRAZOLE SODIUM 40 MG PO TBEC
40.0000 mg | DELAYED_RELEASE_TABLET | Freq: Every day | ORAL | Status: DC
  Administered 2021-02-14 – 2021-02-15 (×2): 40 mg via ORAL
  Filled 2021-02-14 (×2): qty 1

## 2021-02-14 MED ORDER — DOCUSATE SODIUM 100 MG PO CAPS
100.0000 mg | ORAL_CAPSULE | Freq: Two times a day (BID) | ORAL | Status: DC
  Administered 2021-02-14 – 2021-02-15 (×2): 100 mg via ORAL
  Filled 2021-02-14 (×2): qty 1

## 2021-02-14 MED ORDER — ONDANSETRON HCL 4 MG/2ML IJ SOLN: 4.0000 mg | Freq: Four times a day (QID) | INTRAMUSCULAR | Status: DC | PRN

## 2021-02-14 MED ORDER — POTASSIUM CHLORIDE CRYS ER 20 MEQ PO TBCR
30.0000 meq | EXTENDED_RELEASE_TABLET | ORAL | Status: DC
  Administered 2021-02-15: 30 meq via ORAL
  Filled 2021-02-14: qty 1

## 2021-02-14 MED ORDER — BISOPROLOL FUMARATE 5 MG PO TABS
5.0000 mg | ORAL_TABLET | Freq: Every day | ORAL | Status: DC
  Administered 2021-02-15: 5 mg via ORAL
  Filled 2021-02-14: qty 1

## 2021-02-14 MED ORDER — POLYETHYLENE GLYCOL 3350 17 G PO PACK: 17.0000 g | PACK | Freq: Every day | ORAL | Status: DC | PRN

## 2021-02-14 MED ORDER — DEXAMETHASONE SODIUM PHOSPHATE 10 MG/ML IJ SOLN
INTRAMUSCULAR | Status: DC | PRN
Start: 2021-02-14 — End: 2021-02-14
  Administered 2021-02-14: 4 mg via INTRAVENOUS

## 2021-02-14 MED ORDER — GABAPENTIN 300 MG PO CAPS
600.0000 mg | ORAL_CAPSULE | ORAL | Status: DC
  Administered 2021-02-15: 600 mg via ORAL
  Filled 2021-02-14: qty 2

## 2021-02-14 MED ORDER — LORAZEPAM 1 MG PO TABS: 1.0000 mg | ORAL_TABLET | Freq: Three times a day (TID) | ORAL | Status: DC | PRN

## 2021-02-14 MED ORDER — FENTANYL CITRATE (PF) 250 MCG/5ML IJ SOLN
INTRAMUSCULAR | Status: AC
  Filled 2021-02-14: qty 5

## 2021-02-14 MED ORDER — ROCURONIUM BROMIDE 10 MG/ML (PF) SYRINGE
PREFILLED_SYRINGE | INTRAVENOUS | Status: DC | PRN
  Administered 2021-02-14: 70 mg via INTRAVENOUS

## 2021-02-14 MED ORDER — MONTELUKAST SODIUM 10 MG PO TABS
10.0000 mg | ORAL_TABLET | Freq: Every day | ORAL | Status: DC
  Administered 2021-02-14: 10 mg via ORAL
  Filled 2021-02-14: qty 1

## 2021-02-14 MED ORDER — FENTANYL CITRATE (PF) 250 MCG/5ML IJ SOLN
INTRAMUSCULAR | Status: DC | PRN
  Administered 2021-02-14: 50 ug via INTRAVENOUS
  Administered 2021-02-14: 100 ug via INTRAVENOUS

## 2021-02-14 MED ORDER — CHLORHEXIDINE GLUCONATE 0.12 % MT SOLN
15.0000 mL | Freq: Once | OROMUCOSAL | Status: AC
  Administered 2021-02-14: 15 mL via OROMUCOSAL

## 2021-02-14 MED ORDER — INFLUENZA VAC A&B SA ADJ QUAD 0.5 ML IM PRSY
0.5000 mL | PREFILLED_SYRINGE | INTRAMUSCULAR | Status: AC
  Administered 2021-02-15: 0.5 mL via INTRAMUSCULAR
  Filled 2021-02-14: qty 0.5

## 2021-02-14 MED ORDER — SODIUM CHLORIDE 0.9 % IV SOLN
INTRAVENOUS | Status: AC
  Filled 2021-02-14: qty 250

## 2021-02-14 MED ORDER — FENTANYL CITRATE PF 50 MCG/ML IJ SOSY
PREFILLED_SYRINGE | INTRAMUSCULAR | Status: AC
  Filled 2021-02-14: qty 2

## 2021-02-14 MED ORDER — SPIRONOLACTONE 25 MG PO TABS
25.0000 mg | ORAL_TABLET | Freq: Every day | ORAL | Status: DC
  Administered 2021-02-15: 25 mg via ORAL
  Filled 2021-02-14 (×2): qty 1

## 2021-02-14 MED ORDER — GABAPENTIN 400 MG PO CAPS
1200.0000 mg | ORAL_CAPSULE | Freq: Every day | ORAL | Status: DC
  Administered 2021-02-14: 1200 mg via ORAL
  Filled 2021-02-14: qty 3

## 2021-02-14 MED ORDER — ROPINIROLE HCL 1 MG PO TABS
1.0000 mg | ORAL_TABLET | Freq: Two times a day (BID) | ORAL | Status: DC
  Administered 2021-02-14 – 2021-02-15 (×2): 1 mg via ORAL
  Filled 2021-02-14 (×2): qty 1

## 2021-02-14 MED ORDER — HYDROCODONE-ACETAMINOPHEN 5-325 MG PO TABS
1.0000 | ORAL_TABLET | ORAL | Status: DC | PRN
  Administered 2021-02-14 – 2021-02-15 (×3): 2 via ORAL
  Filled 2021-02-14 (×3): qty 2

## 2021-02-14 MED ORDER — LACTATED RINGERS IV SOLN: INTRAVENOUS | Status: DC

## 2021-02-14 MED ORDER — UMECLIDINIUM BROMIDE 62.5 MCG/ACT IN AEPB
1.0000 | INHALATION_SPRAY | Freq: Every day | RESPIRATORY_TRACT | Status: DC
  Administered 2021-02-14: 1 via RESPIRATORY_TRACT
  Filled 2021-02-14: qty 7

## 2021-02-14 MED ORDER — TRAZODONE HCL 50 MG PO TABS
50.0000 mg | ORAL_TABLET | Freq: Every day | ORAL | Status: DC
  Administered 2021-02-14: 50 mg via ORAL
  Filled 2021-02-14: qty 1

## 2021-02-14 MED ORDER — LEVOTHYROXINE SODIUM 50 MCG PO TABS
175.0000 ug | ORAL_TABLET | Freq: Every day | ORAL | Status: DC
  Administered 2021-02-15: 175 ug via ORAL
  Filled 2021-02-14: qty 1

## 2021-02-14 MED ORDER — ROSUVASTATIN CALCIUM 10 MG PO TABS
10.0000 mg | ORAL_TABLET | Freq: Every day | ORAL | Status: DC
  Administered 2021-02-14: 10 mg via ORAL
  Filled 2021-02-14: qty 1

## 2021-02-14 MED ORDER — MIDAZOLAM HCL 2 MG/2ML IJ SOLN
INTRAMUSCULAR | Status: DC | PRN
  Administered 2021-02-14: 2 mg via INTRAVENOUS

## 2021-02-14 MED ORDER — EMPAGLIFLOZIN 10 MG PO TABS
10.0000 mg | ORAL_TABLET | Freq: Every day | ORAL | Status: DC
  Administered 2021-02-15: 10 mg via ORAL
  Filled 2021-02-14: qty 1

## 2021-02-14 MED ORDER — ONDANSETRON HCL 4 MG/2ML IJ SOLN
INTRAMUSCULAR | Status: DC | PRN
  Administered 2021-02-14: 4 mg via INTRAVENOUS

## 2021-02-14 MED ORDER — PREDNISONE 5 MG PO TABS
2.5000 mg | ORAL_TABLET | Freq: Every day | ORAL | Status: DC
  Administered 2021-02-15: 2.5 mg via ORAL
  Filled 2021-02-14: qty 1

## 2021-02-14 MED ORDER — LIDOCAINE 2% (20 MG/ML) 5 ML SYRINGE
INTRAMUSCULAR | Status: DC | PRN
  Administered 2021-02-14: 100 mg via INTRAVENOUS

## 2021-02-14 MED ORDER — MIDAZOLAM HCL 2 MG/2ML IJ SOLN
INTRAMUSCULAR | Status: AC
  Filled 2021-02-14: qty 2

## 2021-02-14 MED ORDER — GABAPENTIN 300 MG PO CAPS: 600.0000 mg | ORAL_CAPSULE | ORAL | Status: DC

## 2021-02-14 NOTE — Anesthesia Procedure Notes (Signed)
Procedure Name: Intubation Date/Time: 02/14/2021 11:36 AM Performed by: Sharlette Dense, CRNA Patient Re-evaluated:Patient Re-evaluated prior to induction Oxygen Delivery Method: Circle system utilized Preoxygenation: Pre-oxygenation with 100% oxygen Induction Type: IV induction Ventilation: Mask ventilation without difficulty and Oral airway inserted - appropriate to patient size Laryngoscope Size: Miller and 3 Grade View: Grade I Tube size: 8.0 mm Number of attempts: 1 Airway Equipment and Method: Stylet Placement Confirmation: ETT inserted through vocal cords under direct vision, positive ETCO2 and breath sounds checked- equal and bilateral Secured at: 22 cm Tube secured with: Tape Dental Injury: Teeth and Oropharynx as per pre-operative assessment

## 2021-02-14 NOTE — Transfer of Care (Signed)
Immediate Anesthesia Transfer of Care Note  Patient: [name]  Procedure(s) Performed: RADIOLOGY WITH ANESTHESIA LEFT RENAL CRYOABLATION (Left)  Patient Location: PACU  Anesthesia Type:General  Level of Consciousness: awake and alert   Airway & Oxygen Therapy: Patient Spontanous Breathing and Patient connected to face mask oxygen  Post-op Assessment: Report given to RN and Post -op Vital signs reviewed and stable  Post vital signs: Reviewed and stable  Last Vitals:  Vitals Value Taken Time  BP    Temp    Pulse 60 02/14/21 1333  Resp 16 02/14/21 1333  SpO2 100 % 02/14/21 1333  Vitals shown include unvalidated device data.  Last Pain: There were no vitals filed for this visit.       Complications: No notable events documented.

## 2021-02-14 NOTE — Procedures (Signed)
Interventional Radiology Procedure Note  Procedure: [place] and CT guided cryoablation of left renal carcinoma  Anesthesia: General  Complications: None  Estimated Blood Loss: < 10 mL  Findings: Left renal carcinoma treated with cryoablation via single Ice Force CX probe.   Plan: PACU recovery followed by overnight observation.  Venetia Night. Kathlene Cote, M.D Pager:  [phone]

## 2021-02-14 NOTE — Progress Notes (Signed)
Pt seen in PACU, where he is reclined in stretcher, and is in no apparent distress.  He voices primary discomfort from foley catheter and minimal pain at the cryoablation site.  VSS.  Dressing is clean and dry.  Foley bag contains concentrated yellow urine without any evidence of blood.  OK to d/c foley and use urinal for remaining 6-hr bed rest.      Electronically Signed: Pasty Spillers 02/14/2021, 3:27 PM

## 2021-02-14 NOTE — Sedation Documentation (Signed)
Anesthesia in to sedate and monitor. 

## 2021-02-14 NOTE — Anesthesia Postprocedure Evaluation (Signed)
Anesthesia Post Note  Patient: [name]  Procedure(s) Performed: RADIOLOGY WITH ANESTHESIA LEFT RENAL CRYOABLATION (Left)     Anesthesia Type: General Anesthetic complications: no   No notable events documented.  Last Vitals: There were no vitals filed for this visit.  Last Pain: There were no vitals filed for this visit.               Jillian Pianka

## 2021-02-14 NOTE — H&P (Signed)
Chief Complaint: Patient was seen in consultation today for left clear-cell renal carcinoma   Supervising Physician: Aletta Edouard  Patient Status: [facility] - Out-pt  History of Present Illness: [name] is a 70 y.o. male with past medical history of asthma, a fib, CHF, DM2, HTN, OSA who has been followed by Dr. 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   He has been deemed a candidate for percutaneous cryoblation.    He presents for procedure today in his usual state of health.  Denies new concerns or complaints.  He has been NPO.  He has held his Eliquis. He is agreeable to proceed  Past Medical History:  Diagnosis Date   Aortic stenosis, mild    Arthritis of hand    "just a little bit in both hands" (03/31/2012)   Asthma    "little bit" (03/31/2012)   Atrial fibrillation ([place])    dx '04; DCCV '04, placed on flecainide, failed DCCV 04/2010, flecainide stopped; s/p successful A.fib ablation 01/31/12   CHF (congestive heart failure) ([place])    Controlled type 2 diabetes mellitus without complication, without long-term current use of insulin ([place]) 12/13/2019   COPD (chronic obstructive pulmonary disease) (HCC)    DJD (degenerative joint disease)    Fibromyalgia    GERD (gastroesophageal reflux disease)    Hyperlipidemia    Hypertension    Hypothyroidism    S/P radiation   Left atrial enlargement    LA size 47mm by echo 11/21/11   Mitral regurgitation    trivial   Obstructive sleep apnea    mild by sleep study 2013; pt stated he does not have a machine because "it wasn't bad enough for him to have one"   Pacemaker 03/31/2012   Restless leg syndrome    Second degree AV block    Splenomegaly    Visit for monitoring Tikosyn therapy 04/16/2016    Past Surgical History:  Procedure Laterality Date   AORTIC VALVE REPLACEMENT N/A 08/22/2015   Procedure: AORTIC VALVE REPLACEMENT (AVR);  Surgeon: Ivin Poot, MD;  Location: [place];  Service: Open Heart Surgery;  Laterality: N/A;   ATRIAL FIBRILLATION ABLATION  01/30/2012   PVI by Dr. Rayann Heman   ATRIAL FIBRILLATION ABLATION N/A 01/31/2012   Procedure: ATRIAL FIBRILLATION ABLATION;  Surgeon: Thompson Grayer, MD;  Location: [facility] CATH LAB;  Service: Cardiovascular;  Laterality: N/A;   ATRIAL FIBRILLATION ABLATION N/A 04/19/2020   Procedure: ATRIAL FIBRILLATION ABLATION;  Surgeon: Thompson Grayer, MD;  Location: [place] CV LAB;  Service: Cardiovascular;  Laterality: N/A;   BACK SURGERY     X 3   CARDIAC CATHETERIZATION N/A 08/09/2015   Procedure: Right/Left Heart Cath and Coronary Angiography;  Surgeon: Peter M Martinique, MD;  Location: [place] CV LAB;  Service: Cardiovascular;  Laterality: N/A;   CARDIAC CATHETERIZATION N/A 02/05/2016   Procedure: Right/Left Heart Cath and Coronary Angiography;  Surgeon: Jettie Booze, MD;  Location: [place] CV LAB;  Service: Cardiovascular;  Laterality: N/A;   CARDIAC CATHETERIZATION N/A 04/17/2016   Procedure: Right Heart Cath;  Surgeon: Jolaine Artist, MD;  Location: [place] CV LAB;  Service: Cardiovascular;  Laterality: N/A;   CARDIOVASCULAR STRESS TEST  03/12/2002   EF 48%, NO EVIDENCE OF ISCHEMIA   CARDIOVERSION  01/2012; 03/31/2012   CARDIOVERSION N/A 02/25/2012   Procedure: CARDIOVERSION;  Surgeon: Thompson Grayer, MD;  Location: [facility] CATH LAB;  Service: Cardiovascular;  Laterality: N/A;  CARDIOVERSION N/A 03/31/2012   Procedure: CARDIOVERSION;  Surgeon: Thompson Grayer, MD;  Location: [facility] CATH LAB;  Service: Cardiovascular;  Laterality: N/A;   CARDIOVERSION N/A 11/23/2015   Procedure: CARDIOVERSION;  Surgeon: Larey Dresser, MD;  Location: [place];  Service: Cardiovascular;  Laterality: N/A;   CARDIOVERSION N/A 04/08/2016   Procedure: CARDIOVERSION;  Surgeon: Jolaine Artist, MD;  Location: [place];  Service: Cardiovascular;  Laterality: N/A;   CARDIOVERSION N/A 09/14/2018   Procedure: CARDIOVERSION;  Surgeon:  Josue Hector, MD;  Location: [place];  Service: Cardiovascular;  Laterality: N/A;   CARDIOVERSION N/A 10/19/2019   Procedure: CARDIOVERSION;  Surgeon: Jolaine Artist, MD;  Location: [facility] ENDOSCOPY;  Service: Cardiovascular;  Laterality: N/A;   CARDIOVERSION N/A 02/25/2020   Procedure: CARDIOVERSION;  Surgeon: Fay Records, MD;  Location: [place];  Service: Cardiovascular;  Laterality: N/A;   CARDIOVERSION N/A 05/25/2020   Procedure: CARDIOVERSION;  Surgeon: Lelon Perla, MD;  Location: [facility] ENDOSCOPY;  Service: Cardiovascular;  Laterality: N/A;   CARDIOVERSION N/A 07/10/2020   Procedure: CARDIOVERSION;  Surgeon: Jerline Pain, MD;  Location: [place];  Service: Cardiovascular;  Laterality: N/A;   CLIPPING OF ATRIAL APPENDAGE N/A 08/22/2015   Procedure: CLIPPING OF ATRIAL APPENDAGE;  Surgeon: Ivin Poot, MD;  Location: [place];  Service: Open Heart Surgery;  Laterality: N/A;   DOPPLER ECHOCARDIOGRAPHY  03/11/2002   EF 70-75%   FINGER SURGERY Left    Middle finger   FINGER TENDON REPAIR  1980's   "right little finger" (03/31/2012)   INSERT / REPLACE / REMOVE PACEMAKER     [employer]   IR RADIOLOGIST EVAL & MGMT  05/06/2019   IR RADIOLOGIST EVAL & MGMT  10/06/2019   IR RADIOLOGIST EVAL & MGMT  10/31/2020   IR RADIOLOGIST EVAL & MGMT  01/18/2021   LEFT AND RIGHT HEART CATHETERIZATION WITH CORONARY ANGIOGRAM N/A 10/27/2012   Procedure: LEFT AND RIGHT HEART CATHETERIZATION WITH CORONARY ANGIOGRAM;  Surgeon: Laverda Page, MD;  Location: [facility] CATH LAB;  Service: Cardiovascular;  Laterality: N/A;   LUMBAR DISC SURGERY  1980's X2;  2000's   MAZE N/A 08/22/2015   Procedure: MAZE;  Surgeon: Ivin Poot, MD;  Location: [place];  Service: Open Heart Surgery;  Laterality: N/A;   PACEMAKER INSERTION  03/31/2012   STJ Accent DR pacemaker implanted by Dr Rayann Heman   PERMANENT PACEMAKER INSERTION N/A 03/31/2012   Procedure: PERMANENT PACEMAKER INSERTION;  Surgeon: Thompson Grayer, MD;  Location:  [facility] CATH LAB;  Service: Cardiovascular;  Laterality: N/A;   PPM GENERATOR CHANGEOUT N/A 08/08/2020   Procedure: PPM GENERATOR CHANGEOUT;  Surgeon: Thompson Grayer, MD;  Location: [place] CV LAB;  Service: Cardiovascular;  Laterality: N/A;   TEE WITHOUT CARDIOVERSION  01/30/2012   Procedure: TRANSESOPHAGEAL ECHOCARDIOGRAM (TEE);  Surgeon: Larey Dresser, MD;  Location: [place];  Service: Cardiovascular;  Laterality: N/A;  ablation next day   TEE WITHOUT CARDIOVERSION N/A 08/22/2015   Procedure: TRANSESOPHAGEAL ECHOCARDIOGRAM (TEE);  Surgeon: Ivin Poot, MD;  Location: [place];  Service: Open Heart Surgery;  Laterality: N/A;   TEE WITHOUT CARDIOVERSION N/A 10/19/2019   Procedure: TRANSESOPHAGEAL ECHOCARDIOGRAM (TEE);  Surgeon: Jolaine Artist, MD;  Location: [facility] ENDOSCOPY;  Service: Cardiovascular;  Laterality: N/A;   US ECHOCARDIOGRAPHY  08/07/2009   EF 55-60%    Allergies: Meloxicam and Vancomycin  Medications: Prior to Admission medications   Medication Sig Start Date End Date Taking? Authorizing Provider  albuterol (PROVENTIL HFA;VENTOLIN HFA)  108 (90 Base) MCG/ACT inhaler Inhale 2 puffs into the lungs every 6 (six) hours as needed for wheezing or shortness of breath. 12/28/15  Yes Collene Gobble, MD  amLODipine (NORVASC) 5 MG tablet Take 5 mg by mouth daily.   Yes [provider]  apixaban (ELIQUIS) 5 MG TABS tablet Take 1 tablet (5 mg total) by mouth 2 (two) times daily. 07/06/20  Yes Bensimhon, Shaune Pascal, MD  bisoprolol (ZEBETA) 5 MG tablet Take 1 tablet (5 mg total) by mouth daily. Needs appt 12/25/20  Yes Bensimhon, Shaune Pascal, MD  Budeson-Glycopyrrol-Formoterol (BREZTRI AEROSPHERE) 160-9-4.8 MCG/ACT AERO Inhale 2 puffs into the lungs in the morning and at bedtime. 11/30/20  Yes Martyn Ehrich, NP  Calcium-Magnesium-Zinc (CAL-MAG-ZINC PO) Take 1 tablet by mouth daily. Vit D   Yes [provider]  cholecalciferol (VITAMIN D3) 25 MCG (1000 UNIT) tablet Take 4,000  Units by mouth at bedtime.   Yes [provider]  empagliflozin (JARDIANCE) 10 MG TABS tablet Take 1 tablet (10 mg total) by mouth daily before breakfast. Patient taking differently: Take 10 mg by mouth daily. 03/14/20  Yes Bensimhon, Shaune Pascal, MD  fexofenadine (ALLEGRA) 180 MG tablet Take 1 tablet (180 mg total) by mouth daily as needed for allergies or rhinitis. 01/23/20  Yes Aline August, MD  gabapentin (NEURONTIN) 300 MG capsule Take 600-1,200 mg by mouth See admin instructions. Take 600 mg by mouth in the morning, 600 mg at lunchtime, and take 1200 mg at bedtime 09/13/14  Yes [provider]  levothyroxine (SYNTHROID, LEVOTHROID) 175 MCG tablet Take 175 mcg by mouth daily before breakfast.   Yes [provider]  LORazepam (ATIVAN) 1 MG tablet Take 1 tablet (1 mg total) by mouth every 8 (eight) hours as needed (nausea and/or restlessness). Patient taking differently: Take 1 mg by mouth daily. 08/09/20  Yes Carmin Muskrat, MD  Menthol-Camphor (TIGER BALM ARTHRITIS RUB EX) Apply 1 application topically continuous as needed (pain).   Yes [provider]  montelukast (SINGULAIR) 10 MG tablet Take 1 tablet (10 mg total) by mouth at bedtime. 12/11/20  Yes Parrett, Tammy S, NP  Multiple Vitamin (MULTIVITAMIN WITH MINERALS) TABS tablet Take 1 tablet by mouth daily. 12/15/19  Yes Sheikh, Omair Latif, DO  mupirocin ointment (BACTROBAN) 2 % Apply 1 application topically daily as needed (infection animal Scratch). 12/25/20  Yes [provider]  ondansetron (ZOFRAN-ODT) 4 MG disintegrating tablet Take 4 mg by mouth every 8 (eight) hours as needed for nausea or vomiting.   Yes [provider]  oxyCODONE-acetaminophen (PERCOCET/ROXICET) 5-325 MG tablet Take 1 tablet by mouth every 12 (twelve) hours.   Yes [provider]  OXYGEN Inhale 2 L into the lungs at bedtime.   Yes [provider]  pantoprazole (PROTONIX) 40 MG tablet Take 40 mg by  mouth daily.   Yes [provider]  polyethylene glycol powder (GLYCOLAX/MIRALAX) 17 GM/SCOOP powder Take 17 g by mouth daily as needed for mild constipation. 01/23/20  Yes Aline August, MD  Polyvinyl Alcohol-Povidone (REFRESH OP) Place 1 drop into both eyes daily as needed (dry eyes).   Yes [provider]  potassium chloride SA (KLOR-CON) 20 MEQ tablet Take 30 mEq by mouth 2 (two) times daily. Every other day   Yes [provider]  predniSONE (DELTASONE) 5 MG tablet Take 1 tablet (5 mg total) by mouth daily with breakfast. Patient taking differently: Take 2.5 mg by mouth daily with breakfast. 07/19/20  Yes Geraldo Pitter  W, NP  promethazine (PHENERGAN) 25 MG tablet Take 1 tablet (25 mg total) by mouth every 6 (six) hours as needed for nausea or vomiting. 12/10/20  Yes Drenda Freeze, MD  rOPINIRole (REQUIP) 1 MG tablet Take 1 tablet (1 mg total) by mouth in the morning and at bedtime. 01/23/20  Yes Aline August, MD  rosuvastatin (CRESTOR) 10 MG tablet Take 10 mg by mouth at bedtime.  08/08/16  Yes [provider]  spironolactone (ALDACTONE) 25 MG tablet Take 1 tablet (25 mg total) by mouth daily. 02/16/16  Yes Shirley Friar, PA-C  torsemide (DEMADEX) 20 MG tablet Take 30 mg by mouth every other day.   Yes [provider]  traMADol (ULTRAM) 50 MG tablet Take 100 mg by mouth 2 (two) times daily. 10/26/20  Yes [provider]  traZODone (DESYREL) 50 MG tablet Take 50 mg by mouth at bedtime. 08/30/20  Yes [provider]     Family History  Problem Relation Age of Onset   Heart failure Mother    Stroke Father     Social History   Socioeconomic History   Marital status: Married    Spouse name: Not on file   Number of children: Not on file   Years of education: Not on file   Highest education level: Not on file  Occupational History   Occupation: retired  Tobacco Use   Smoking status: Former    Packs/day: 1.00     Years: 25.00    Pack years: 25.00    Types: Cigarettes    Start date: 1963    Quit date: 07/18/1991    Years since quitting: 29.6   Smokeless tobacco: Never  Vaping Use   Vaping Use: Never used  Substance and Sexual Activity   Alcohol use: Not Currently    Comment: occasional   Drug use: Yes    Types: Marijuana    Comment: rarely use   Sexual activity: Not Currently  Other Topics Concern   Not on file  Social History Narrative   Lives in [place] with spouse.  Works as a Air traffic controller.   Social Determinants of Health   Financial Resource Strain: Not on file  Food Insecurity: Not on file  Transportation Needs: Not on file  Physical Activity: Not on file  Stress: Not on file  Social Connections: Not on file     Review of Systems: A 12 point ROS discussed and pertinent positives are indicated in the HPI above.  All other systems are negative.  Review of Systems  Constitutional: Negative.   HENT: Negative.    Eyes: Negative.   Respiratory: Negative.    Cardiovascular: Negative.   Gastrointestinal: Negative.   Genitourinary: Negative.   Musculoskeletal: Negative.   Skin: Negative.   Neurological:  Positive for numbness.       Painful tingling of the feet.   Vital Signs: There were no vitals taken for this visit.  Physical Exam Constitutional:      Appearance: Normal appearance.  HENT:     Head: Normocephalic and atraumatic.     Nose: Nose normal.     Mouth/Throat:     Mouth: Mucous membranes are moist.     Pharynx: Oropharynx is clear.  Eyes:     Extraocular Movements: Extraocular movements intact.  Cardiovascular:     Rate and Rhythm: Normal rate and regular rhythm.     Pulses: Normal pulses.  Pulmonary:     Effort: Pulmonary effort is normal.  No respiratory distress.  Chest:     Chest wall: No tenderness.  Abdominal:     General: Abdomen is flat.     Palpations: Abdomen is soft.  Musculoskeletal:     Cervical back: Neck supple.  Skin:     General: Skin is warm and dry.     Comments: Venous stasis of bilateral lower extremities  Neurological:     Mental Status: He is alert.     Sensory: Sensory deficit present.     Comments: Sensory deficit of bilateral feet  Psychiatric:        Mood and Affect: Mood normal.        Behavior: Behavior normal.        Thought Content: Thought content normal.    Imaging: CUP PACEART REMOTE DEVICE CHECK  Result Date: 02/08/2021 Scheduled remote reviewed. Normal device function.  Next remote 91 days. LR  IR Radiologist Eval & Mgmt  Result Date: 01/18/2021 Please refer to notes tab for details about interventional procedure. (Op Note)   Labs:  CBC: Recent Labs    11/15/20 0621 12/10/20 1351 02/02/21 1117 02/14/21 1023  WBC 8.6 11.4* 9.0 9.7  HGB 15.0 13.6 13.9 14.2  HCT 46.8 41.4 44.9 44.4  PLT 174 135* 153 180    COAGS: Recent Labs    11/15/20 0621 02/14/21 1023  INR 1.2 1.1  APTT 34  --     BMP: Recent Labs    03/29/20 1043 04/12/20 1214 05/29/20 1244 07/10/20 0920 07/19/20 1025 08/09/20 0602 12/10/20 1351 02/02/21 1117  NA 138   < > 135   < > 137 139 136 137  K 4.4   < > 3.9   < > 4.7 4.3 4.3 4.2  CL 96   < > 95*   < > 96 103 103 100  CO2 27   < > 27  --  24 24 25 28   GLUCOSE 251*   < > 191*   < > 212* 110* 129* 120*  BUN 33*   < > 29*   < > 28* 23 19 28*  CALCIUM 9.6   < > 9.9  --  10.0 9.5 9.1 9.4  CREATININE 1.88*   < > 1.96*   < > 1.76* 1.56* 1.24 2.24*  GFRNONAA 35*   < > 36*  --   --  47* >60 31*  GFRAA 41*  --   --   --   --   --   --   --    < > = values in this interval not displayed.    LIVER FUNCTION TESTS: Recent Labs    12/10/20 1351  BILITOT 1.6*  AST 20  ALT 18  ALKPHOS 71  PROT 7.3  ALBUMIN 4.1     Assessment and Plan:  Left clear cell carcinoma  --ablation discussed with patient  --labs and chart reviewed --OK to proceed with planned cryoblation    Risks and benefits of image guided renal cryoablation was  discussed with the patient including, but not limited to, failure to treat entire lesion, bleeding, infection, damage to adjacent structures, hematuria, urine leak, decrease in renal function or post procedural neuropathy.  All of the patient's questions were answered and the patient is agreeable to proceed. Consent signed and in chart.   Thank you for this interesting consult.  I greatly enjoyed meeting BOW BUNTYN and look forward to participating in their care.  A copy of this report was sent to  the requesting provider on this date.  Electronically Signed: Pasty Spillers, PA 02/14/2021, 11:07 AM   I spent a total of 25 Minutes in face to face in clinical consultation, greater than 50% of which was counseling/coordinating care for clear cell carcinoma

## 2021-02-14 NOTE — Plan of Care (Signed)

## 2021-02-15 ENCOUNTER — Other Ambulatory Visit: Payer: Self-pay | Admitting: Physician Assistant

## 2021-02-15 ENCOUNTER — Encounter (HOSPITAL_COMMUNITY): Payer: Self-pay | Admitting: Interventional Radiology

## 2021-02-15 DIAGNOSIS — J45909 Unspecified asthma, uncomplicated: Secondary | ICD-10-CM | POA: Diagnosis not present

## 2021-02-15 DIAGNOSIS — J449 Chronic obstructive pulmonary disease, unspecified: Secondary | ICD-10-CM | POA: Diagnosis not present

## 2021-02-15 DIAGNOSIS — I509 Heart failure, unspecified: Secondary | ICD-10-CM | POA: Diagnosis not present

## 2021-02-15 DIAGNOSIS — Z950 Presence of cardiac pacemaker: Secondary | ICD-10-CM | POA: Diagnosis not present

## 2021-02-15 DIAGNOSIS — C649 Malignant neoplasm of unspecified kidney, except renal pelvis: Secondary | ICD-10-CM | POA: Diagnosis not present

## 2021-02-15 DIAGNOSIS — Z79899 Other long term (current) drug therapy: Secondary | ICD-10-CM | POA: Diagnosis not present

## 2021-02-15 DIAGNOSIS — E039 Hypothyroidism, unspecified: Secondary | ICD-10-CM | POA: Diagnosis not present

## 2021-02-15 DIAGNOSIS — Z23 Encounter for immunization: Secondary | ICD-10-CM | POA: Diagnosis not present

## 2021-02-15 DIAGNOSIS — E119 Type 2 diabetes mellitus without complications: Secondary | ICD-10-CM | POA: Diagnosis not present

## 2021-02-15 DIAGNOSIS — I110 Hypertensive heart disease with heart failure: Secondary | ICD-10-CM | POA: Diagnosis not present

## 2021-02-15 DIAGNOSIS — N289 Disorder of kidney and ureter, unspecified: Secondary | ICD-10-CM

## 2021-02-15 DIAGNOSIS — Z7901 Long term (current) use of anticoagulants: Secondary | ICD-10-CM | POA: Diagnosis not present

## 2021-02-15 DIAGNOSIS — Z87891 Personal history of nicotine dependence: Secondary | ICD-10-CM | POA: Diagnosis not present

## 2021-02-15 LAB — BASIC METABOLIC PANEL WITH GFR
Anion gap: 11 (ref 5–15)
BUN: 29 mg/dL — ABNORMAL HIGH (ref 8–23)
CO2: 21 mmol/L — ABNORMAL LOW (ref 22–32)
Calcium: 8.9 mg/dL (ref 8.9–10.3)
Chloride: 104 mmol/L (ref 98–111)
Creatinine, Ser: 1.54 mg/dL — ABNORMAL HIGH (ref 0.61–1.24)
GFR, Estimated: 48 mL/min — ABNORMAL LOW
Glucose, Bld: 124 mg/dL — ABNORMAL HIGH (ref 70–99)
Potassium: 4.5 mmol/L (ref 3.5–5.1)
Sodium: 136 mmol/L (ref 135–145)

## 2021-02-15 LAB — CBC
HCT: 42.3 % (ref 39.0–52.0)
Hemoglobin: 13.3 g/dL (ref 13.0–17.0)
MCH: 29.1 pg (ref 26.0–34.0)
MCHC: 31.4 g/dL (ref 30.0–36.0)
MCV: 92.6 fL (ref 80.0–100.0)
Platelets: 162 K/uL (ref 150–400)
RBC: 4.57 MIL/uL (ref 4.22–5.81)
RDW: 16.9 % — ABNORMAL HIGH (ref 11.5–15.5)
WBC: 13.3 K/uL — ABNORMAL HIGH (ref 4.0–10.5)
nRBC: 0 % (ref 0.0–0.2)

## 2021-02-15 LAB — GLUCOSE, CAPILLARY: Glucose-Capillary: 114 mg/dL — ABNORMAL HIGH (ref 70–99)

## 2021-02-15 NOTE — Progress Notes (Signed)
Remote pacemaker transmission.   

## 2021-02-15 NOTE — Discharge Summary (Signed)
Patient ID: [name] MRN: [id number] DOB/AGE: 71/10/1950 71 y.o.  Admit date: 02/14/2021 Discharge date: 02/15/2021  Supervising Physician: Mir, Sharen Heck  Patient Status: [facility] - In-pt  Admission Diagnoses: Clear Cell Renal Cancer  Discharge Diagnoses:  Active Problems:   Status post cryoablation   Discharged Condition: good  Hospital Course: YAKUB LODES is a 70 y.o. male with past medical history of asthma, a fib, CHF, DM2, HTN, and OSA.  He has been followed by Dr. Kathlene Cote since 04/2019 for enlarging renal mass.  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     Dr. Kathlene Cote deemed him a good candidate for percutaneous cryoblation.      He underwent successful cryoablation yesterday and was admitted overnight for observation.  He is doing well this morning. He has already been up and ambulated. He is tolerating a diet, voiding normally. He is ready for discharge home.  He is clear to resume his anticoagulation this evening.  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 The patient presents for cryoablation of the renal carcinoma.   EXAM: CT-GUIDED PERCUTANEOUS CRYOABLATION OF LEFT RENAL CARCINOMA   ANESTHESIA/SEDATION: General   MEDICATIONS: 2 g IV Ancef. The antibiotic was administered in an appropriate time interval prior to needle puncture of the skin.   CONTRAST:  None   PROCEDURE: The procedure, risks, benefits, and alternatives were  explained to the patient. Questions regarding the procedure were encouraged and answered. The patient understands and consents to the procedure. A time-out was performed prior to initiating the procedure.   The patient was placed under general anesthesia. Initial unenhanced CT was performed in a prone position to localize the left kidney.   The left flank region was prepped with chlorhexidine in a sterile fashion, and a sterile drape was applied covering the operative field. A sterile gown and sterile gloves were used for the procedure.   Under ultrasound guidance, an IceForce CX percutaneous cryoablation probe was advanced into the left renal mass. Probe positioning was confirmed by CT prior to cryoablation.   Cryoablation was performed through the single probe. Initial 10 minute cycle of cryoablation was performed. This was followed by a 8 minute thaw cycle. A second 10 minute cycle of cryoablation was then performed. During ablation, periodic CT imaging was performed to monitor ice ball formation and morphology. After active thaw, a 30 second cautery cycle was performed and the cryoablation probe was then removed.   COMPLICATIONS: None   FINDINGS: The left renal carcinoma was well visualized by ultrasound similar to the prior biopsy procedure. After probe placement by ultrasound, CT monitoring during cryoablation demonstrates adequate ice ball formation encompassing the renal mass.   IMPRESSION: CT and ultrasound guided cryoablation of biopsy-proven left renal carcinoma.     Electronically Signed   By: Aletta Edouard M.D.   On: 02/14/2021 15:24    Discharge Exam: Blood pressure 122/71, pulse 62, temperature  58 F (36.7 C), temperature source Oral, resp. rate 17, SpO2 94 %. Awake and alert NAD Lungs clear No respiratory distress Heart RRR Access site ok, no bleeding   Disposition: Discharge disposition: 01-Home or Self Care       Discharge Instructions      Call MD for:  persistant nausea and vomiting   Complete by: As directed    Call MD for:  redness, tenderness, or signs of infection (pain, swelling, redness, odor or green/yellow discharge around incision site)   Complete by: As directed    Call MD for:  severe uncontrolled pain   Complete by: As directed    Call MD for:  temperature >100.4   Complete by: As directed    Diet - low sodium heart healthy   Complete by: As directed    Discharge wound care:   Complete by: As directed    Ok to remove dressing this evening and shower. No need for additional dressings.   Increase activity slowly   Complete by: As directed       Allergies as of 02/15/2021       Reactions   Meloxicam Rash   Vancomycin Other (See Comments)   Red Man's syndrome 09/02/15, resolved with diphenhydramine and slowing of rate        Medication List     TAKE these medications    albuterol 108 (90 Base) MCG/ACT inhaler Commonly known as: VENTOLIN HFA Inhale 2 puffs into the lungs every 6 (six) hours as needed for wheezing or shortness of breath.   amLODipine 5 MG tablet Commonly known as: NORVASC Take 5 mg by mouth daily.   apixaban 5 MG Tabs tablet Commonly known as: ELIQUIS Take 1 tablet (5 mg total) by mouth 2 (two) times daily.   bisoprolol 5 MG tablet Commonly known as: ZEBETA Take 1 tablet (5 mg total) by mouth daily. Needs appt   Breztri Aerosphere 160-9-4.8 MCG/ACT Aero Generic drug: Budeson-Glycopyrrol-Formoterol Inhale 2 puffs into the lungs in the morning and at bedtime.   CAL-MAG-ZINC PO Take 1 tablet by mouth daily. Vit D   cholecalciferol 25 MCG (1000 UNIT) tablet Commonly known as: VITAMIN D3 Take 4,000 Units by mouth at bedtime.   empagliflozin 10 MG Tabs tablet Commonly known as: Jardiance Take 1 tablet (10 mg total) by mouth daily before breakfast. What changed: when to take this   fexofenadine 180 MG tablet Commonly known as: ALLEGRA Take 1 tablet (180 mg total)  by mouth daily as needed for allergies or rhinitis.   gabapentin 300 MG capsule Commonly known as: NEURONTIN Take 600-1,200 mg by mouth See admin instructions. Take 600 mg by mouth in the morning, 600 mg at lunchtime, and take 1200 mg at bedtime   levothyroxine 175 MCG tablet Commonly known as: SYNTHROID Take 175 mcg by mouth daily before breakfast.   LORazepam 1 MG tablet Commonly known as: Ativan Take 1 tablet (1 mg total) by mouth every 8 (eight) hours as needed (nausea and/or restlessness). What changed: when to take this   montelukast 10 MG tablet Commonly known as: SINGULAIR Take 1 tablet (10 mg total) by mouth at bedtime.   multivitamin with minerals Tabs tablet Take 1 tablet by mouth daily.   mupirocin ointment 2 % Commonly known as: BACTROBAN Apply 1 application topically daily as needed (infection animal Scratch).   ondansetron 4 MG disintegrating tablet Commonly known as: ZOFRAN-ODT Take 4 mg by mouth every 8 (eight) hours as needed for nausea or  vomiting.   oxyCODONE-acetaminophen 5-325 MG tablet Commonly known as: PERCOCET/ROXICET Take 1 tablet by mouth every 12 (twelve) hours.   OXYGEN Inhale 2 L into the lungs at bedtime.   pantoprazole 40 MG tablet Commonly known as: PROTONIX Take 40 mg by mouth daily.   polyethylene glycol powder 17 GM/SCOOP powder Commonly known as: GLYCOLAX/MIRALAX Take 17 g by mouth daily as needed for mild constipation.   potassium chloride SA 20 MEQ tablet Commonly known as: KLOR-CON Take 30 mEq by mouth 2 (two) times daily. Every other day   predniSONE 5 MG tablet Commonly known as: DELTASONE Take 1 tablet (5 mg total) by mouth daily with breakfast. What changed: how much to take   promethazine 25 MG tablet Commonly known as: PHENERGAN Take 1 tablet (25 mg total) by mouth every 6 (six) hours as needed for nausea or vomiting.   REFRESH OP Place 1 drop into both eyes daily as needed (dry eyes).   rOPINIRole 1 MG  tablet Commonly known as: REQUIP Take 1 tablet (1 mg total) by mouth in the morning and at bedtime.   rosuvastatin 10 MG tablet Commonly known as: CRESTOR Take 10 mg by mouth at bedtime.   spironolactone 25 MG tablet Commonly known as: ALDACTONE Take 1 tablet (25 mg total) by mouth daily.   TIGER BALM ARTHRITIS RUB EX Apply 1 application topically continuous as needed (pain).   torsemide 20 MG tablet Commonly known as: DEMADEX Take 30 mg by mouth every other day.   traMADol 50 MG tablet Commonly known as: ULTRAM Take 100 mg by mouth 2 (two) times daily.   traZODone 50 MG tablet Commonly known as: DESYREL Take 50 mg by mouth at bedtime.               Discharge Care Instructions  (From admission, onward)           Start     Ordered   02/15/21 0000  Discharge wound care:       Comments: Ok to remove dressing this evening and shower. No need for additional dressings.   02/15/21 0917           IR clinic will call patient with f/u appointment details.  Electronically Signed: Murrell Redden, PA-C 02/15/2021, 9:18 AM     I have spent Less Than 30 Minutes discharging MATEUS REWERTS.

## 2021-03-08 ENCOUNTER — Ambulatory Visit
Admission: RE | Admit: 2021-03-08 | Discharge: 2021-03-08 | Disposition: A | Discharge status: Home / Self Care | Payer: Medicare Other | Source: Ambulatory Visit | Attending: Physician Assistant | Admitting: Physician Assistant

## 2021-03-08 ENCOUNTER — Other Ambulatory Visit: Payer: Self-pay

## 2021-03-08 DIAGNOSIS — Z9889 Other specified postprocedural states: Secondary | ICD-10-CM | POA: Diagnosis not present

## 2021-03-08 DIAGNOSIS — N289 Disorder of kidney and ureter, unspecified: Secondary | ICD-10-CM

## 2021-03-08 DIAGNOSIS — C642 Malignant neoplasm of left kidney, except renal pelvis: Secondary | ICD-10-CM | POA: Diagnosis not present

## 2021-03-08 HISTORY — PX: IR RADIOLOGIST EVAL & MGMT: IMG5224

## 2021-03-08 NOTE — Progress Notes (Signed)
Chief Complaint: Patient was consulted remotely today (TeleHealth) for follow up after cryoablation of a left renal carcinoma on 02/14/2021.  History of Present Illness: [name] is a 70 y.o. 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  The procedure was well-tolerated with no issues after overnight observation.  He is having no urinary symptoms.  He continues to have some lower extremity pain, right greater than left, for which she has had a work-up with CT of the lumbar spine and CTA of the aorta with ileofemoral run-off. There was no evidence of significant arterial occlusive disease. He does have a history of prior lumbar surgery by Dr. Vertell Limber and is planning on scheduling a follow up appointment with him for evaluation of his current symptoms.  Past Medical History:  Diagnosis Date   Aortic stenosis, mild    Arthritis of hand    "just a little bit in both hands" (03/31/2012)   Asthma    "little bit" (03/31/2012)   Atrial fibrillation ([place])    dx '04; DCCV '04, placed on flecainide, failed DCCV 04/2010, flecainide stopped; s/p successful A.fib ablation 01/31/12   CHF (congestive heart failure) ([place])    Controlled type 2 diabetes mellitus without complication, without long-term current use of insulin ([place]) 12/13/2019   COPD (chronic obstructive pulmonary disease) (HCC)    DJD (degenerative joint disease)    Fibromyalgia    GERD (gastroesophageal reflux disease)    Hyperlipidemia    Hypertension    Hypothyroidism    S/P radiation   Left atrial enlargement    LA size 25mm by echo 11/21/11   Mitral regurgitation    trivial   Obstructive sleep apnea    mild by sleep study 2013; pt stated he does not have a machine because "it wasn't bad enough for him to have one"   Pacemaker 03/31/2012   Restless leg syndrome    Second degree AV block    Splenomegaly    Visit for monitoring Tikosyn therapy  04/16/2016    Past Surgical History:  Procedure Laterality Date   AORTIC VALVE REPLACEMENT N/A 08/22/2015   Procedure: AORTIC VALVE REPLACEMENT (AVR);  Surgeon: Ivin Poot, MD;  Location: [place];  Service: Open Heart Surgery;  Laterality: N/A;   ATRIAL FIBRILLATION ABLATION  01/30/2012   PVI by Dr. Rayann Heman   ATRIAL FIBRILLATION ABLATION N/A 01/31/2012   Procedure: ATRIAL FIBRILLATION ABLATION;  Surgeon: Thompson Grayer, MD;  Location: [facility] CATH LAB;  Service: Cardiovascular;  Laterality: N/A;   ATRIAL FIBRILLATION ABLATION N/A 04/19/2020   Procedure: ATRIAL FIBRILLATION ABLATION;  Surgeon: Thompson Grayer, MD;  Location: [place] CV LAB;  Service: Cardiovascular;  Laterality: N/A;   BACK SURGERY     X 3   CARDIAC CATHETERIZATION N/A 08/09/2015   Procedure: Right/Left Heart Cath and Coronary Angiography;  Surgeon: Peter M Martinique, MD;  Location: [place] CV LAB;  Service: Cardiovascular;  Laterality: N/A;   CARDIAC CATHETERIZATION N/A 02/05/2016   Procedure: Right/Left Heart Cath and Coronary Angiography;  Surgeon: Jettie Booze, MD;  Location: [place] CV LAB;  Service: Cardiovascular;  Laterality: N/A;   CARDIAC CATHETERIZATION N/A 04/17/2016   Procedure: Right Heart Cath;  Surgeon: Jolaine Artist, MD;  Location: [place] CV LAB;  Service: Cardiovascular;  Laterality: N/A;   CARDIOVASCULAR STRESS TEST  03/12/2002   EF 48%, NO EVIDENCE OF ISCHEMIA   CARDIOVERSION  01/2012; 03/31/2012   CARDIOVERSION  N/A 02/25/2012   Procedure: CARDIOVERSION;  Surgeon: Thompson Grayer, MD;  Location: [facility] CATH LAB;  Service: Cardiovascular;  Laterality: N/A;   CARDIOVERSION N/A 03/31/2012   Procedure: CARDIOVERSION;  Surgeon: Thompson Grayer, MD;  Location: [facility] CATH LAB;  Service: Cardiovascular;  Laterality: N/A;   CARDIOVERSION N/A 11/23/2015   Procedure: CARDIOVERSION;  Surgeon: Larey Dresser, MD;  Location: [place];  Service: Cardiovascular;  Laterality: N/A;   CARDIOVERSION N/A 04/08/2016    Procedure: CARDIOVERSION;  Surgeon: Jolaine Artist, MD;  Location: [place];  Service: Cardiovascular;  Laterality: N/A;   CARDIOVERSION N/A 09/14/2018   Procedure: CARDIOVERSION;  Surgeon: Josue Hector, MD;  Location: [place];  Service: Cardiovascular;  Laterality: N/A;   CARDIOVERSION N/A 10/19/2019   Procedure: CARDIOVERSION;  Surgeon: Jolaine Artist, MD;  Location: [facility] ENDOSCOPY;  Service: Cardiovascular;  Laterality: N/A;   CARDIOVERSION N/A 02/25/2020   Procedure: CARDIOVERSION;  Surgeon: Fay Records, MD;  Location: [place];  Service: Cardiovascular;  Laterality: N/A;   CARDIOVERSION N/A 05/25/2020   Procedure: CARDIOVERSION;  Surgeon: Lelon Perla, MD;  Location: [facility] ENDOSCOPY;  Service: Cardiovascular;  Laterality: N/A;   CARDIOVERSION N/A 07/10/2020   Procedure: CARDIOVERSION;  Surgeon: Jerline Pain, MD;  Location: [place];  Service: Cardiovascular;  Laterality: N/A;   CLIPPING OF ATRIAL APPENDAGE N/A 08/22/2015   Procedure: CLIPPING OF ATRIAL APPENDAGE;  Surgeon: Ivin Poot, MD;  Location: [place];  Service: Open Heart Surgery;  Laterality: N/A;   DOPPLER ECHOCARDIOGRAPHY  03/11/2002   EF 70-75%   FINGER SURGERY Left    Middle finger   FINGER TENDON REPAIR  1980's   "right little finger" (03/31/2012)   INSERT / REPLACE / REMOVE PACEMAKER     [employer]   IR RADIOLOGIST EVAL & MGMT  05/06/2019   IR RADIOLOGIST EVAL & MGMT  10/06/2019   IR RADIOLOGIST EVAL & MGMT  10/31/2020   IR RADIOLOGIST EVAL & MGMT  01/18/2021   LEFT AND RIGHT HEART CATHETERIZATION WITH CORONARY ANGIOGRAM N/A 10/27/2012   Procedure: LEFT AND RIGHT HEART CATHETERIZATION WITH CORONARY ANGIOGRAM;  Surgeon: Laverda Page, MD;  Location: [facility] CATH LAB;  Service: Cardiovascular;  Laterality: N/A;   LUMBAR DISC SURGERY  1980's X2;  2000's   MAZE N/A 08/22/2015   Procedure: MAZE;  Surgeon: Ivin Poot, MD;  Location: [place];  Service: Open Heart Surgery;  Laterality: N/A;   PACEMAKER  INSERTION  03/31/2012   STJ Accent DR pacemaker implanted by Dr Rayann Heman   PERMANENT PACEMAKER INSERTION N/A 03/31/2012   Procedure: PERMANENT PACEMAKER INSERTION;  Surgeon: Thompson Grayer, MD;  Location: [facility] CATH LAB;  Service: Cardiovascular;  Laterality: N/A;   PPM GENERATOR CHANGEOUT N/A 08/08/2020   Procedure: PPM GENERATOR CHANGEOUT;  Surgeon: Thompson Grayer, MD;  Location: [place] CV LAB;  Service: Cardiovascular;  Laterality: N/A;   RADIOLOGY WITH ANESTHESIA Left 02/14/2021   Procedure: RADIOLOGY WITH ANESTHESIA LEFT RENAL CRYOABLATION;  Surgeon: Aletta Edouard, MD;  Location: [facility];  Service: Radiology;  Laterality: Left;   TEE WITHOUT CARDIOVERSION  01/30/2012   Procedure: TRANSESOPHAGEAL ECHOCARDIOGRAM (TEE);  Surgeon: Larey Dresser, MD;  Location: [place];  Service: Cardiovascular;  Laterality: N/A;  ablation next day   TEE WITHOUT CARDIOVERSION N/A 08/22/2015   Procedure: TRANSESOPHAGEAL ECHOCARDIOGRAM (TEE);  Surgeon: Ivin Poot, MD;  Location: [place];  Service: Open Heart Surgery;  Laterality: N/A;   TEE WITHOUT CARDIOVERSION N/A 10/19/2019   Procedure: TRANSESOPHAGEAL ECHOCARDIOGRAM (TEE);  Surgeon: Jolaine Artist, MD;  Location: [facility] ENDOSCOPY;  Service: Cardiovascular;  Laterality: N/A;   US ECHOCARDIOGRAPHY  08/07/2009   EF 55-60%    Allergies: Meloxicam and Vancomycin  Medications: Prior to Admission medications   Medication Sig Start Date End Date Taking? Authorizing Provider  albuterol (PROVENTIL HFA;VENTOLIN HFA) 108 (90 Base) MCG/ACT inhaler Inhale 2 puffs into the lungs every 6 (six) hours as needed for wheezing or shortness of breath. 12/28/15   Collene Gobble, MD  amLODipine (NORVASC) 5 MG tablet Take 5 mg by mouth daily.    [provider]  apixaban (ELIQUIS) 5 MG TABS tablet Take 1 tablet (5 mg total) by mouth 2 (two) times daily. 07/06/20   Bensimhon, Shaune Pascal, MD  bisoprolol (ZEBETA) 5 MG tablet Take 1 tablet (5 mg total) by mouth daily. Needs  appt 12/25/20   Bensimhon, Shaune Pascal, MD  Budeson-Glycopyrrol-Formoterol (BREZTRI AEROSPHERE) 160-9-4.8 MCG/ACT AERO Inhale 2 puffs into the lungs in the morning and at bedtime. 11/30/20   Martyn Ehrich, NP  Calcium-Magnesium-Zinc (CAL-MAG-ZINC PO) Take 1 tablet by mouth daily. Vit D    [provider]  cholecalciferol (VITAMIN D3) 25 MCG (1000 UNIT) tablet Take 4,000 Units by mouth at bedtime.    [provider]  empagliflozin (JARDIANCE) 10 MG TABS tablet Take 1 tablet (10 mg total) by mouth daily before breakfast. Patient taking differently: Take 10 mg by mouth daily. 03/14/20   Bensimhon, Shaune Pascal, MD  fexofenadine (ALLEGRA) 180 MG tablet Take 1 tablet (180 mg total) by mouth daily as needed for allergies or rhinitis. 01/23/20   Aline August, MD  gabapentin (NEURONTIN) 300 MG capsule Take 600-1,200 mg by mouth See admin instructions. Take 600 mg by mouth in the morning, 600 mg at lunchtime, and take 1200 mg at bedtime 09/13/14   [provider]  levothyroxine (SYNTHROID, LEVOTHROID) 175 MCG tablet Take 175 mcg by mouth daily before breakfast.    [provider]  LORazepam (ATIVAN) 1 MG tablet Take 1 tablet (1 mg total) by mouth every 8 (eight) hours as needed (nausea and/or restlessness). Patient taking differently: Take 1 mg by mouth daily. 08/09/20   Carmin Muskrat, MD  Menthol-Camphor (TIGER BALM ARTHRITIS RUB EX) Apply 1 application topically continuous as needed (pain).    [provider]  montelukast (SINGULAIR) 10 MG tablet Take 1 tablet (10 mg total) by mouth at bedtime. 12/11/20   Parrett, Fonnie Mu, NP  Multiple Vitamin (MULTIVITAMIN WITH MINERALS) TABS tablet Take 1 tablet by mouth daily. 12/15/19   Raiford Noble Latif, DO  mupirocin ointment (BACTROBAN) 2 % Apply 1 application topically daily as needed (infection animal Scratch). 12/25/20   [provider]  ondansetron (ZOFRAN-ODT) 4 MG disintegrating tablet Take 4 mg by mouth every 8  (eight) hours as needed for nausea or vomiting.    [provider]  oxyCODONE-acetaminophen (PERCOCET/ROXICET) 5-325 MG tablet Take 1 tablet by mouth every 12 (twelve) hours.    [provider]  OXYGEN Inhale 2 L into the lungs at bedtime.    [provider]  pantoprazole (PROTONIX) 40 MG tablet Take 40 mg by mouth daily.    [provider]  polyethylene glycol powder (GLYCOLAX/MIRALAX) 17 GM/SCOOP powder Take 17 g by mouth daily as needed for mild constipation. 01/23/20   Aline August, MD  Polyvinyl Alcohol-Povidone (REFRESH OP) Place 1 drop into both eyes daily as needed (dry eyes).    [provider]  potassium chloride SA (KLOR-CON)  20 MEQ tablet Take 30 mEq by mouth 2 (two) times daily. Every other day    [provider]  predniSONE (DELTASONE) 5 MG tablet Take 1 tablet (5 mg total) by mouth daily with breakfast. Patient taking differently: Take 2.5 mg by mouth daily with breakfast. 07/19/20   Martyn Ehrich, NP  promethazine (PHENERGAN) 25 MG tablet Take 1 tablet (25 mg total) by mouth every 6 (six) hours as needed for nausea or vomiting. 12/10/20   Drenda Freeze, MD  rOPINIRole (REQUIP) 1 MG tablet Take 1 tablet (1 mg total) by mouth in the morning and at bedtime. 01/23/20   Aline August, MD  rosuvastatin (CRESTOR) 10 MG tablet Take 10 mg by mouth at bedtime.  08/08/16   [provider]  spironolactone (ALDACTONE) 25 MG tablet Take 1 tablet (25 mg total) by mouth daily. 02/16/16   Shirley Friar, PA-C  torsemide (DEMADEX) 20 MG tablet Take 30 mg by mouth every other day.    [provider]  traMADol (ULTRAM) 50 MG tablet Take 100 mg by mouth 2 (two) times daily. 10/26/20   [provider]  traZODone (DESYREL) 50 MG tablet Take 50 mg by mouth at bedtime. 08/30/20   [provider]     Family History  Problem Relation Age of Onset   Heart failure Mother    Stroke Father     Social  History   Socioeconomic History   Marital status: Married    Spouse name: Not on file   Number of children: Not on file   Years of education: Not on file   Highest education level: Not on file  Occupational History   Occupation: retired  Tobacco Use   Smoking status: Former    Packs/day: 1.00    Years: 25.00    Pack years: 25.00    Types: Cigarettes    Start date: 1963    Quit date: 07/18/1991    Years since quitting: 29.6   Smokeless tobacco: Never  Vaping Use   Vaping Use: Never used  Substance and Sexual Activity   Alcohol use: Not Currently    Comment: occasional   Drug use: Yes    Types: Marijuana    Comment: rarely use   Sexual activity: Not Currently  Other Topics Concern   Not on file  Social History Narrative   Lives in [place] with spouse.  Works as a Air traffic controller.   Social Determinants of Health   Financial Resource Strain: Not on file  Food Insecurity: Not on file  Transportation Needs: Not on file  Physical Activity: Not on file  Stress: Not on file  Social Connections: Not on file    ECOG Status: 0 - Asymptomatic  Review of Systems  Constitutional: Negative.   Respiratory: Negative.    Cardiovascular: Negative.   Gastrointestinal: Negative.   Genitourinary: Negative.   Musculoskeletal: Negative.   Neurological:        Bilateral lower extremity pain, R>L.   Review of Systems: A 12 point ROS discussed and pertinent positives are indicated in the HPI above.  All other systems are negative.  Physical Exam No direct physical exam was performed (except for noted visual exam findings with Video Visits).    Vital Signs: There were no vitals taken for this visit.  Imaging: CT GUIDE TISSUE ABLATION  Result Date: 02/14/2021 CLINICAL DATA:  Biopsy-proven clear cell renal carcinoma of the posterior interpolar left kidney measuring approximately 2 cm in diameter. The  patient presents for cryoablation of the renal carcinoma. EXAM:  CT-GUIDED PERCUTANEOUS CRYOABLATION OF LEFT RENAL CARCINOMA ANESTHESIA/SEDATION: General MEDICATIONS: 2 g IV Ancef. The antibiotic was administered in an appropriate time interval prior to needle puncture of the skin. CONTRAST:  None PROCEDURE: The procedure, risks, benefits, and alternatives were explained to the patient. Questions regarding the procedure were encouraged and answered. The patient understands and consents to the procedure. A time-out was performed prior to initiating the procedure. The patient was placed under general anesthesia. Initial unenhanced CT was performed in a prone position to localize the left kidney. The left flank region was prepped with chlorhexidine in a sterile fashion, and a sterile drape was applied covering the operative field. A sterile gown and sterile gloves were used for the procedure. Under ultrasound guidance, an IceForce CX percutaneous cryoablation probe was advanced into the left renal mass. Probe positioning was confirmed by CT prior to cryoablation. Cryoablation was performed through the single probe. Initial 10 minute cycle of cryoablation was performed. This was followed by a 8 minute thaw cycle. A second 10 minute cycle of cryoablation was then performed. During ablation, periodic CT imaging was performed to monitor ice ball formation and morphology. After active thaw, a 30 second cautery cycle was performed and the cryoablation probe was then removed. COMPLICATIONS: None FINDINGS: The left renal carcinoma was well visualized by ultrasound similar to the prior biopsy procedure. After probe placement by ultrasound, CT monitoring during cryoablation demonstrates adequate ice ball formation encompassing the renal mass. IMPRESSION: CT and ultrasound guided cryoablation of biopsy-proven left renal carcinoma. Electronically Signed   By: Aletta Edouard M.D.   On: 02/14/2021 15:24   CUP PACEART REMOTE DEVICE CHECK  Result Date: 02/08/2021 Scheduled remote reviewed.  Normal device function.  Next remote 91 days. LR   Labs:  CBC: Recent Labs    12/10/20 1351 02/02/21 1117 02/14/21 1023 02/15/21 0543  WBC 11.4* 9.0 9.7 13.3*  HGB 13.6 13.9 14.2 13.3  HCT 41.4 44.9 44.4 42.3  PLT 135* 153 180 162    COAGS: Recent Labs    11/15/20 0621 02/14/21 1023  INR 1.2 1.1  APTT 34  --     BMP: Recent Labs    03/29/20 1043 04/12/20 1214 12/10/20 1351 02/02/21 1117 02/14/21 1023 02/15/21 0543  NA 138   < > 136 137 137 136  K 4.4   < > 4.3 4.2 3.9 4.5  CL 96   < > 103 100 102 104  CO2 27   < > 25 28 26  21*  GLUCOSE 251*   < > 129* 120* 125* 124*  BUN 33*   < > 19 28* 31* 29*  CALCIUM 9.6   < > 9.1 9.4 9.1 8.9  CREATININE 1.88*   < > 1.24 2.24* 1.82* 1.54*  GFRNONAA 35*   < > >60 31* 39* 48*  GFRAA 41*  --   --   --   --   --    < > = values in this interval not displayed.    LIVER FUNCTION TESTS: Recent Labs    12/10/20 1351  BILITOT 1.6*  AST 20  ALT 18  ALKPHOS 71  PROT 7.3  ALBUMIN 4.1     Assessment and Plan:  I spoke with Mr. Crite and his wife over the phone.  He is doing well after recent cryoablation of the left renal carcinoma without evidence of complication.  I recommended a follow-up CT of the abdomen in  January, 3 months after ablation and will meet with him after follow-up imaging is performed.   Electronically Signed: Azzie Roup 03/08/2021, 10:00 AM    I spent a total of  10 Minutes in remote  clinical consultation, greater than 50% of which was counseling/coordinating care post ablation of a left renal carcinoma.    Visit type: Audio only (telephone). Audio (no video) only due to patient's lack of internet/smartphone capability. Alternative for in-person consultation at [facility], [address], [place], [place]. This visit type was conducted due to national recommendations for restrictions regarding the COVID-19 Pandemic (e.g. social distancing).  This format is felt to be most  appropriate for this patient at this time.  All issues noted in this document were discussed and addressed.

## 2021-03-15 DIAGNOSIS — E119 Type 2 diabetes mellitus without complications: Secondary | ICD-10-CM | POA: Diagnosis not present

## 2021-03-19 DIAGNOSIS — M4802 Spinal stenosis, cervical region: Secondary | ICD-10-CM | POA: Diagnosis not present

## 2021-03-19 DIAGNOSIS — M48062 Spinal stenosis, lumbar region with neurogenic claudication: Secondary | ICD-10-CM | POA: Diagnosis not present

## 2021-03-26 ENCOUNTER — Other Ambulatory Visit (HOSPITAL_COMMUNITY): Payer: Self-pay | Admitting: Neurological Surgery

## 2021-03-26 DIAGNOSIS — M4802 Spinal stenosis, cervical region: Secondary | ICD-10-CM

## 2021-03-26 DIAGNOSIS — M48062 Spinal stenosis, lumbar region with neurogenic claudication: Secondary | ICD-10-CM

## 2021-04-02 ENCOUNTER — Telehealth (HOSPITAL_COMMUNITY): Payer: Self-pay | Admitting: Pharmacy Technician

## 2021-04-02 NOTE — Telephone Encounter (Signed)
Advanced Heart Failure Patient Advocate Encounter  Spoke to patient's wife regarding re-enrollment of Jardiance assistance with [employer]. Sent application via docusign link.  Will fax in once all signatures are obtained.   Patient's wife also requested a month of samples. Those have been left for them to pick up.

## 2021-04-04 DIAGNOSIS — R040 Epistaxis: Secondary | ICD-10-CM | POA: Diagnosis not present

## 2021-04-04 DIAGNOSIS — Z87891 Personal history of nicotine dependence: Secondary | ICD-10-CM | POA: Diagnosis not present

## 2021-04-04 DIAGNOSIS — J342 Deviated nasal septum: Secondary | ICD-10-CM | POA: Diagnosis not present

## 2021-04-04 DIAGNOSIS — Z7901 Long term (current) use of anticoagulants: Secondary | ICD-10-CM | POA: Diagnosis not present

## 2021-04-10 DIAGNOSIS — Z125 Encounter for screening for malignant neoplasm of prostate: Secondary | ICD-10-CM | POA: Diagnosis not present

## 2021-04-10 DIAGNOSIS — E1169 Type 2 diabetes mellitus with other specified complication: Secondary | ICD-10-CM | POA: Diagnosis not present

## 2021-04-10 DIAGNOSIS — I10 Essential (primary) hypertension: Secondary | ICD-10-CM | POA: Diagnosis not present

## 2021-04-10 DIAGNOSIS — E785 Hyperlipidemia, unspecified: Secondary | ICD-10-CM | POA: Diagnosis not present

## 2021-04-17 NOTE — Telephone Encounter (Signed)
Sent in application via fax 75/19.  Will follow up.

## 2021-05-01 ENCOUNTER — Other Ambulatory Visit: Payer: Self-pay | Admitting: Interventional Radiology

## 2021-05-01 DIAGNOSIS — N2889 Other specified disorders of kidney and ureter: Secondary | ICD-10-CM

## 2021-05-09 ENCOUNTER — Ambulatory Visit (INDEPENDENT_AMBULATORY_CARE_PROVIDER_SITE_OTHER): Payer: Medicare Other

## 2021-05-09 DIAGNOSIS — I442 Atrioventricular block, complete: Secondary | ICD-10-CM | POA: Diagnosis not present

## 2021-05-09 LAB — CUP PACEART REMOTE DEVICE CHECK
Battery Remaining Longevity: 134 mo
Battery Remaining Percentage: 95.5 %
Battery Voltage: 3.02 V
Brady Statistic RV Percent Paced: 99 %
Date Time Interrogation Session: 20230111020012
Implantable Lead Implant Date: 20131203
Implantable Lead Implant Date: 20131203
Implantable Lead Location: 753859
Implantable Lead Location: 753860
Implantable Lead Model: 1948
Implantable Pulse Generator Implant Date: 20220412
Lead Channel Impedance Value: 610 Ohm
Lead Channel Pacing Threshold Amplitude: 0.875 V
Lead Channel Pacing Threshold Pulse Width: 0.5 ms
Lead Channel Sensing Intrinsic Amplitude: 7.5 mV
Lead Channel Setting Pacing Amplitude: 1.125
Lead Channel Setting Pacing Pulse Width: 0.5 ms
Lead Channel Setting Sensing Sensitivity: 4 mV
Pulse Gen Model: 2272
Pulse Gen Serial Number: 3919039

## 2021-05-11 ENCOUNTER — Telehealth: Payer: Self-pay | Admitting: Student

## 2021-05-11 MED ORDER — APIXABAN 5 MG PO TABS: 5.0000 mg | ORAL_TABLET | Freq: Two times a day (BID) | ORAL | 6 refills | Status: DC

## 2021-05-11 NOTE — Telephone Encounter (Signed)
°*  STAT* If patient is at the pharmacy, call can be transferred to refill team.   1. Which medications need to be refilled? (please list name of each medication and dose if known)  apixaban (ELIQUIS) 5 MG TABS tablet  2. Which pharmacy/location (including street and city if local pharmacy) is medication to be sent to? [id number] - [place], [place] - [address] AT [place] [address]  3. Do they need a 30 day or 90 suppy? 30 day supply

## 2021-05-11 NOTE — Telephone Encounter (Signed)
Eliquis 5 mg refill request received. Patient is 72 years old, weight- 82.6 kg, Crea- 1.54 on 02/15/21 , Diagnosis- afib & flutter, and last seen by Dr. Rayann Heman on 12/22/20. Dose is appropriate based on dosing criteria. Will send in refill to requested pharmacy.

## 2021-05-14 ENCOUNTER — Telehealth (HOSPITAL_COMMUNITY): Payer: Self-pay | Admitting: Pharmacy Technician

## 2021-05-14 ENCOUNTER — Other Ambulatory Visit (HOSPITAL_COMMUNITY): Payer: Self-pay

## 2021-05-14 NOTE — Telephone Encounter (Signed)
Advanced Heart Failure Patient Advocate Encounter  Patient's wife called in concerned about the Eliquis copay but was not sure what it would be this year. Looks like a refill was sent to the pharmacy so I cannot see what it is at this time. Looks like next month it will be $47. Called and left the patient's wife a message. They can pick up the rx and it will count towards their 3% OOP requirement for BMS assistance or they could call the main number to the clinic and request a month of samples.   Charlann Boxer, CPhT

## 2021-05-18 NOTE — Progress Notes (Signed)
Remote pacemaker transmission.   

## 2021-05-21 ENCOUNTER — Other Ambulatory Visit (HOSPITAL_COMMUNITY): Payer: Self-pay

## 2021-05-22 ENCOUNTER — Ambulatory Visit (HOSPITAL_COMMUNITY)
Admission: RE | Admit: 2021-05-22 | Discharge: 2021-05-22 | Disposition: A | Discharge status: Home / Self Care | Payer: Medicare Other | Source: Ambulatory Visit | Attending: Neurological Surgery | Admitting: Neurological Surgery

## 2021-05-22 ENCOUNTER — Other Ambulatory Visit: Payer: Self-pay

## 2021-05-22 DIAGNOSIS — M48062 Spinal stenosis, lumbar region with neurogenic claudication: Secondary | ICD-10-CM | POA: Insufficient documentation

## 2021-05-22 DIAGNOSIS — M4802 Spinal stenosis, cervical region: Secondary | ICD-10-CM

## 2021-05-22 NOTE — Progress Notes (Signed)
Informed of MRI for today.   Device system confirmed to be MRI conditional, with implant date > 6 weeks ago, and no evidence of abandoned or epicardial leads in review of most recent CXR  Confirmed with industry that Deport lead was recently approved for MRI conditionality, despite not yet being listed on the site.   Interrogation from today reviewed, pt is currently VP at ~60 bpm (permanent AF) Change device settings for MRI to VOO at 85 bpm  Tachy-therapies to off if applicable.  Program device back to pre-MRI settings after completion of exam.  Annamaria Helling  05/22/2021 12:49 PM

## 2021-05-22 NOTE — Progress Notes (Signed)
Per order, Changed device settings for MRI to VOO at 85 bpm   Tachy-therapies to off if applicable.   Will program device back to pre-MRI settings after completion of exam

## 2021-06-07 ENCOUNTER — Other Ambulatory Visit (HOSPITAL_BASED_OUTPATIENT_CLINIC_OR_DEPARTMENT_OTHER): Payer: Self-pay

## 2021-06-07 ENCOUNTER — Emergency Department (HOSPITAL_BASED_OUTPATIENT_CLINIC_OR_DEPARTMENT_OTHER): Payer: Medicare Other

## 2021-06-07 ENCOUNTER — Encounter (HOSPITAL_BASED_OUTPATIENT_CLINIC_OR_DEPARTMENT_OTHER): Payer: Self-pay | Admitting: *Deleted

## 2021-06-07 ENCOUNTER — Other Ambulatory Visit: Payer: Self-pay

## 2021-06-07 ENCOUNTER — Emergency Department (HOSPITAL_BASED_OUTPATIENT_CLINIC_OR_DEPARTMENT_OTHER)
Admission: EM | Admit: 2021-06-07 | Discharge: 2021-06-07 | Disposition: A | Discharge status: Home / Self Care | Payer: Medicare Other | Attending: Emergency Medicine | Admitting: Emergency Medicine

## 2021-06-07 ENCOUNTER — Ambulatory Visit (HOSPITAL_COMMUNITY): Payer: Medicare Other

## 2021-06-07 ENCOUNTER — Encounter (HOSPITAL_BASED_OUTPATIENT_CLINIC_OR_DEPARTMENT_OTHER): Payer: Self-pay | Admitting: Emergency Medicine

## 2021-06-07 ENCOUNTER — Emergency Department (HOSPITAL_BASED_OUTPATIENT_CLINIC_OR_DEPARTMENT_OTHER)
Admission: EM | Admit: 2021-06-07 | Discharge: 2021-06-08 | Disposition: A | Discharge status: Home / Self Care | Payer: Medicare Other | Source: Home / Self Care | Attending: Emergency Medicine | Admitting: Emergency Medicine

## 2021-06-07 DIAGNOSIS — Z87891 Personal history of nicotine dependence: Secondary | ICD-10-CM | POA: Insufficient documentation

## 2021-06-07 DIAGNOSIS — I110 Hypertensive heart disease with heart failure: Secondary | ICD-10-CM | POA: Insufficient documentation

## 2021-06-07 DIAGNOSIS — J449 Chronic obstructive pulmonary disease, unspecified: Secondary | ICD-10-CM | POA: Insufficient documentation

## 2021-06-07 DIAGNOSIS — Z79899 Other long term (current) drug therapy: Secondary | ICD-10-CM | POA: Insufficient documentation

## 2021-06-07 DIAGNOSIS — K429 Umbilical hernia without obstruction or gangrene: Secondary | ICD-10-CM | POA: Insufficient documentation

## 2021-06-07 DIAGNOSIS — C649 Malignant neoplasm of unspecified kidney, except renal pelvis: Secondary | ICD-10-CM | POA: Insufficient documentation

## 2021-06-07 DIAGNOSIS — Z9861 Coronary angioplasty status: Secondary | ICD-10-CM | POA: Insufficient documentation

## 2021-06-07 DIAGNOSIS — R112 Nausea with vomiting, unspecified: Secondary | ICD-10-CM | POA: Insufficient documentation

## 2021-06-07 DIAGNOSIS — K8020 Calculus of gallbladder without cholecystitis without obstruction: Secondary | ICD-10-CM | POA: Diagnosis not present

## 2021-06-07 DIAGNOSIS — R944 Abnormal results of kidney function studies: Secondary | ICD-10-CM | POA: Insufficient documentation

## 2021-06-07 DIAGNOSIS — Z950 Presence of cardiac pacemaker: Secondary | ICD-10-CM | POA: Diagnosis not present

## 2021-06-07 DIAGNOSIS — Z7951 Long term (current) use of inhaled steroids: Secondary | ICD-10-CM | POA: Diagnosis not present

## 2021-06-07 DIAGNOSIS — I509 Heart failure, unspecified: Secondary | ICD-10-CM | POA: Insufficient documentation

## 2021-06-07 DIAGNOSIS — I700 Atherosclerosis of aorta: Secondary | ICD-10-CM | POA: Insufficient documentation

## 2021-06-07 DIAGNOSIS — R1011 Right upper quadrant pain: Secondary | ICD-10-CM

## 2021-06-07 DIAGNOSIS — E119 Type 2 diabetes mellitus without complications: Secondary | ICD-10-CM | POA: Insufficient documentation

## 2021-06-07 DIAGNOSIS — Z7982 Long term (current) use of aspirin: Secondary | ICD-10-CM | POA: Diagnosis not present

## 2021-06-07 DIAGNOSIS — R1084 Generalized abdominal pain: Secondary | ICD-10-CM | POA: Insufficient documentation

## 2021-06-07 DIAGNOSIS — J45909 Unspecified asthma, uncomplicated: Secondary | ICD-10-CM | POA: Insufficient documentation

## 2021-06-07 DIAGNOSIS — D49519 Neoplasm of unspecified behavior of unspecified kidney: Secondary | ICD-10-CM

## 2021-06-07 DIAGNOSIS — E1159 Type 2 diabetes mellitus with other circulatory complications: Secondary | ICD-10-CM | POA: Insufficient documentation

## 2021-06-07 DIAGNOSIS — G894 Chronic pain syndrome: Secondary | ICD-10-CM | POA: Diagnosis not present

## 2021-06-07 DIAGNOSIS — E039 Hypothyroidism, unspecified: Secondary | ICD-10-CM | POA: Insufficient documentation

## 2021-06-07 DIAGNOSIS — Z20822 Contact with and (suspected) exposure to covid-19: Secondary | ICD-10-CM | POA: Diagnosis not present

## 2021-06-07 DIAGNOSIS — Z7901 Long term (current) use of anticoagulants: Secondary | ICD-10-CM | POA: Diagnosis not present

## 2021-06-07 LAB — COMPREHENSIVE METABOLIC PANEL
ALT: 23 U/L (ref 0–44)
AST: 27 U/L (ref 15–41)
Albumin: 4.6 g/dL (ref 3.5–5.0)
Alkaline Phosphatase: 78 U/L (ref 38–126)
Anion gap: 15 (ref 5–15)
BUN: 27 mg/dL — ABNORMAL HIGH (ref 8–23)
CO2: 25 mmol/L (ref 22–32)
Calcium: 10.4 mg/dL — ABNORMAL HIGH (ref 8.9–10.3)
Chloride: 98 mmol/L (ref 98–111)
Creatinine, Ser: 1.78 mg/dL — ABNORMAL HIGH (ref 0.61–1.24)
GFR, Estimated: 40 mL/min — ABNORMAL LOW (ref >60)
Glucose, Bld: 124 mg/dL — ABNORMAL HIGH (ref 70–99)
Potassium: 4.3 mmol/L (ref 3.5–5.1)
Sodium: 138 mmol/L (ref 135–145)
Total Bilirubin: 1.3 mg/dL — ABNORMAL HIGH (ref 0.3–1.2)
Total Protein: 8 g/dL (ref 6.5–8.1)

## 2021-06-07 LAB — CBC WITH DIFFERENTIAL/PLATELET
Abs Immature Granulocytes: 0.04 10*3/uL (ref 0.00–0.07)
Abs Immature Granulocytes: 0.04 10*3/uL (ref 0.00–0.07)
Basophils Absolute: 0 10*3/uL (ref 0.0–0.1)
Basophils Absolute: 0 10*3/uL (ref 0.0–0.1)
Basophils Relative: 0 %
Basophils Relative: 0 %
Eosinophils Absolute: 0 10*3/uL (ref 0.0–0.5)
Eosinophils Absolute: 0 10*3/uL (ref 0.0–0.5)
Eosinophils Relative: 1 %
Eosinophils Relative: 0 %
HCT: 46.8 % (ref 39.0–52.0)
HCT: 47.5 % (ref 39.0–52.0)
Hemoglobin: 15.1 g/dL (ref 13.0–17.0)
Hemoglobin: 15.4 g/dL (ref 13.0–17.0)
Immature Granulocytes: 1 %
Immature Granulocytes: 0 %
Lymphocytes Relative: 9 %
Lymphocytes Relative: 7 %
Lymphs Abs: 0.7 10*3/uL (ref 0.7–4.0)
Lymphs Abs: 0.6 10*3/uL — ABNORMAL LOW (ref 0.7–4.0)
MCH: 28.4 pg (ref 26.0–34.0)
MCH: 28.7 pg (ref 26.0–34.0)
MCHC: 32.3 g/dL (ref 30.0–36.0)
MCHC: 32.4 g/dL (ref 30.0–36.0)
MCV: 88.1 fL (ref 80.0–100.0)
MCV: 88.6 fL (ref 80.0–100.0)
Monocytes Absolute: 0.8 10*3/uL (ref 0.1–1.0)
Monocytes Absolute: 0.6 10*3/uL (ref 0.1–1.0)
Monocytes Relative: 9 %
Monocytes Relative: 6 %
Neutro Abs: 6.9 10*3/uL (ref 1.7–7.7)
Neutro Abs: 8.3 10*3/uL — ABNORMAL HIGH (ref 1.7–7.7)
Neutrophils Relative %: 80 %
Neutrophils Relative %: 87 %
Platelets: 154 10*3/uL (ref 150–400)
Platelets: 190 10*3/uL (ref 150–400)
RBC: 5.31 MIL/uL (ref 4.22–5.81)
RBC: 5.36 MIL/uL (ref 4.22–5.81)
RDW: 15.6 % — ABNORMAL HIGH (ref 11.5–15.5)
RDW: 15.9 % — ABNORMAL HIGH (ref 11.5–15.5)
WBC: 8.5 10*3/uL (ref 4.0–10.5)
WBC: 9.7 10*3/uL (ref 4.0–10.5)
nRBC: 0 % (ref 0.0–0.2)
nRBC: 0 % (ref 0.0–0.2)

## 2021-06-07 LAB — URINALYSIS, MICROSCOPIC (REFLEX)

## 2021-06-07 LAB — URINALYSIS, ROUTINE W REFLEX MICROSCOPIC
Bilirubin Urine: NEGATIVE
Glucose, UA: >=500 mg/dL — AB
Hgb urine dipstick: NEGATIVE
Ketones, ur: 15 mg/dL — AB
Leukocytes,Ua: NEGATIVE
Nitrite: NEGATIVE
Protein, ur: 30 mg/dL — AB
Specific Gravity, Urine: 1.015 (ref 1.005–1.030)
pH: >=9 (ref 5.0–8.0)

## 2021-06-07 LAB — LIPASE, BLOOD: Lipase: 31 U/L (ref 11–51)

## 2021-06-07 LAB — RESP PANEL BY RT-PCR (FLU A&B, COVID) ARPGX2
Influenza A by PCR: NEGATIVE
Influenza B by PCR: NEGATIVE
SARS Coronavirus 2 by RT PCR: NEGATIVE

## 2021-06-07 MED ORDER — ONDANSETRON 4 MG PO TBDP
4.0000 mg | ORAL_TABLET | Freq: Three times a day (TID) | ORAL | 1 refills | Status: DC | PRN
  Filled 2021-06-07: qty 12, 4d supply, fill #0

## 2021-06-07 MED ORDER — SODIUM CHLORIDE 0.9 % IV BOLUS
1000.0000 mL | Freq: Once | INTRAVENOUS | Status: AC
  Administered 2021-06-07: 1000 mL via INTRAVENOUS

## 2021-06-07 MED ORDER — HYDROMORPHONE HCL 1 MG/ML IJ SOLN
1.0000 mg | Freq: Once | INTRAMUSCULAR | Status: AC
  Administered 2021-06-07: 1 mg via INTRAVENOUS
  Filled 2021-06-07: qty 1

## 2021-06-07 MED ORDER — ONDANSETRON HCL 4 MG/2ML IJ SOLN
4.0000 mg | Freq: Once | INTRAMUSCULAR | Status: AC
  Administered 2021-06-07: 4 mg via INTRAVENOUS
  Filled 2021-06-07: qty 2

## 2021-06-07 MED ORDER — SODIUM CHLORIDE 0.9 % IV SOLN: INTRAVENOUS | Status: DC

## 2021-06-07 MED ORDER — FENTANYL CITRATE PF 50 MCG/ML IJ SOSY
100.0000 ug | PREFILLED_SYRINGE | Freq: Once | INTRAMUSCULAR | Status: AC
  Administered 2021-06-07: 100 ug via INTRAVENOUS
  Filled 2021-06-07: qty 2

## 2021-06-07 MED ORDER — IOHEXOL 300 MG/ML  SOLN
100.0000 mL | Freq: Once | INTRAMUSCULAR | Status: AC | PRN
  Administered 2021-06-07: 80 mL via INTRAVENOUS

## 2021-06-07 NOTE — Discharge Instructions (Addendum)
Reschedule your ultrasound to evaluate the kidney situation that was planned for today.  Work-up here today without any acute findings.  Continue your pain medication that you have at home.  Labs and CT scan had nothing of significance.  Which is reassuring.  COVID and flu testing was negative.  No signs of any significant dehydration.  Take the dissolvable Zofran as needed for nausea and vomiting.

## 2021-06-07 NOTE — ED Provider Notes (Addendum)
[facility] Provider Note   CSN: [id number] Arrival date & time: 06/07/21  0750     History  Chief Complaint  Patient presents with   Emesis    [name] is a 71 y.o. male.  Patient with history of chronic pain.  Followed by pain management.  However he developed fairly significant abdominal pain with nausea and vomiting yesterday.  Has vomited several times.  No diarrhea.  Was seen by ear nose and throat doctor yesterday for nosebleed and it was cauterized.  But that was in the morning.  Past medical history significant for hypertension hyperlipidemia fibromyalgia mitral regurg aortic stenosis mild.  Patient had a pacemaker placed in 2013.  History of hypothyroidism status post radiation.  At the pacemaker placed for second-degree AV block.  History of some congestive heart failure.  COPD and controlled type 2 diabetes.      Home Medications Prior to Admission medications   Medication Sig Start Date End Date Taking? Authorizing Provider  albuterol (PROVENTIL HFA;VENTOLIN HFA) 108 (90 Base) MCG/ACT inhaler Inhale 2 puffs into the lungs every 6 (six) hours as needed for wheezing or shortness of breath. 12/28/15   Collene Gobble, MD  amLODipine (NORVASC) 5 MG tablet Take 5 mg by mouth daily.    [provider]  apixaban (ELIQUIS) 5 MG TABS tablet Take 1 tablet (5 mg total) by mouth 2 (two) times daily. 05/11/21   Allred, Jeneen Rinks, MD  bisoprolol (ZEBETA) 5 MG tablet Take 1 tablet (5 mg total) by mouth daily. Needs appt 12/25/20   Bensimhon, Shaune Pascal, MD  Budeson-Glycopyrrol-Formoterol (BREZTRI AEROSPHERE) 160-9-4.8 MCG/ACT AERO Inhale 2 puffs into the lungs in the morning and at bedtime. 11/30/20   Martyn Ehrich, NP  Calcium-Magnesium-Zinc (CAL-MAG-ZINC PO) Take 1 tablet by mouth daily. Vit D    [provider]  cholecalciferol (VITAMIN D3) 25 MCG (1000 UNIT) tablet Take 4,000 Units by mouth at bedtime.    [provider]   empagliflozin (JARDIANCE) 10 MG TABS tablet Take 1 tablet (10 mg total) by mouth daily before breakfast. Patient taking differently: Take 10 mg by mouth daily. 03/14/20   Bensimhon, Shaune Pascal, MD  fexofenadine (ALLEGRA) 180 MG tablet Take 1 tablet (180 mg total) by mouth daily as needed for allergies or rhinitis. 01/23/20   Aline August, MD  gabapentin (NEURONTIN) 300 MG capsule Take 600-1,200 mg by mouth See admin instructions. Take 600 mg by mouth in the morning, 600 mg at lunchtime, and take 1200 mg at bedtime 09/13/14   [provider]  levothyroxine (SYNTHROID, LEVOTHROID) 175 MCG tablet Take 175 mcg by mouth daily before breakfast.    [provider]  LORazepam (ATIVAN) 1 MG tablet Take 1 tablet (1 mg total) by mouth every 8 (eight) hours as needed (nausea and/or restlessness). Patient taking differently: Take 1 mg by mouth daily. 08/09/20   Carmin Muskrat, MD  Menthol-Camphor (TIGER BALM ARTHRITIS RUB EX) Apply 1 application topically continuous as needed (pain).    [provider]  montelukast (SINGULAIR) 10 MG tablet Take 1 tablet (10 mg total) by mouth at bedtime. 12/11/20   Parrett, Fonnie Mu, NP  Multiple Vitamin (MULTIVITAMIN WITH MINERALS) TABS tablet Take 1 tablet by mouth daily. 12/15/19   Raiford Noble Latif, DO  mupirocin ointment (BACTROBAN) 2 % Apply 1 application topically daily as needed (infection animal Scratch). 12/25/20   [provider]  ondansetron (ZOFRAN-ODT) 4 MG disintegrating tablet Take 4 mg by mouth  every 8 (eight) hours as needed for nausea or vomiting.    [provider]  oxyCODONE-acetaminophen (PERCOCET/ROXICET) 5-325 MG tablet Take 1 tablet by mouth every 12 (twelve) hours.    [provider]  OXYGEN Inhale 2 L into the lungs at bedtime.    [provider]  pantoprazole (PROTONIX) 40 MG tablet Take 40 mg by mouth daily.    [provider]  polyethylene glycol powder (GLYCOLAX/MIRALAX) 17  GM/SCOOP powder Take 17 g by mouth daily as needed for mild constipation. 01/23/20   Aline August, MD  Polyvinyl Alcohol-Povidone (REFRESH OP) Place 1 drop into both eyes daily as needed (dry eyes).    [provider]  potassium chloride SA (KLOR-CON) 20 MEQ tablet Take 30 mEq by mouth 2 (two) times daily. Every other day    [provider]  predniSONE (DELTASONE) 5 MG tablet Take 1 tablet (5 mg total) by mouth daily with breakfast. Patient taking differently: Take 2.5 mg by mouth daily with breakfast. 07/19/20   Martyn Ehrich, NP  promethazine (PHENERGAN) 25 MG tablet Take 1 tablet (25 mg total) by mouth every 6 (six) hours as needed for nausea or vomiting. 12/10/20   Drenda Freeze, MD  rOPINIRole (REQUIP) 1 MG tablet Take 1 tablet (1 mg total) by mouth in the morning and at bedtime. 01/23/20   Aline August, MD  rosuvastatin (CRESTOR) 10 MG tablet Take 10 mg by mouth at bedtime.  08/08/16   [provider]  spironolactone (ALDACTONE) 25 MG tablet Take 1 tablet (25 mg total) by mouth daily. 02/16/16   Shirley Friar, PA-C  torsemide (DEMADEX) 20 MG tablet Take 30 mg by mouth every other day.    [provider]  traMADol (ULTRAM) 50 MG tablet Take 100 mg by mouth 2 (two) times daily. 10/26/20   [provider]  traZODone (DESYREL) 50 MG tablet Take 50 mg by mouth at bedtime. 08/30/20   [provider]      Allergies    Meloxicam and Vancomycin    Review of Systems   Review of Systems  Constitutional:  Negative for chills and fever.  HENT:  Negative for ear pain and sore throat.   Eyes:  Negative for pain and visual disturbance.  Respiratory:  Negative for cough and shortness of breath.   Cardiovascular:  Negative for chest pain and palpitations.  Gastrointestinal:  Positive for abdominal pain, nausea and vomiting. Negative for diarrhea.  Genitourinary:  Negative for dysuria and hematuria.  Musculoskeletal:  Positive for  myalgias. Negative for arthralgias and back pain.  Skin:  Negative for color change and rash.  Neurological:  Negative for seizures and syncope.  All other systems reviewed and are negative.  Physical Exam Updated Vital Signs BP 134/71    Pulse 60    Temp 98.2 F (36.8 C) (Oral)    Resp 17    Ht 1.816 m (5' 11.5")    Wt 79.6 kg    SpO2 99%    BMI 24.14 kg/m  Physical Exam Vitals and nursing note reviewed.  Constitutional:      General: He is in acute distress.     Appearance: Normal appearance. He is well-developed. He is ill-appearing.  HENT:     Head: Normocephalic and atraumatic.  Eyes:     Extraocular Movements: Extraocular movements intact.     Conjunctiva/sclera: Conjunctivae normal.     Pupils: Pupils are equal, round, and reactive to light.  Cardiovascular:  Rate and Rhythm: Normal rate and regular rhythm.     Heart sounds: No murmur heard. Pulmonary:     Effort: Pulmonary effort is normal. No respiratory distress.     Breath sounds: Normal breath sounds.  Abdominal:     General: There is distension.     Palpations: Abdomen is soft.     Tenderness: There is no abdominal tenderness. There is guarding.     Hernia: A hernia is present.     Comments: Evidence of an umbilical hernia but easily reducible.  No significant tenderness to palpation of the hernia.  Diffuse abdominal tenderness and distention.  Musculoskeletal:        General: No swelling.     Cervical back: Normal range of motion and neck supple.     Right lower leg: No edema.     Left lower leg: No edema.  Skin:    General: Skin is warm and dry.     Capillary Refill: Capillary refill takes less than 2 seconds.  Neurological:     General: No focal deficit present.     Mental Status: He is alert and oriented to person, place, and time.  Psychiatric:        Mood and Affect: Mood normal.    ED Results / Procedures / Treatments   Labs (all labs ordered are listed, but only abnormal results are  displayed) Labs Reviewed  CBC WITH DIFFERENTIAL/PLATELET - Abnormal; Notable for the following components:      Result Value   RDW 15.6 (*)    All other components within normal limits  COMPREHENSIVE METABOLIC PANEL - Abnormal; Notable for the following components:   Glucose, Bld 124 (*)    BUN 27 (*)    Creatinine, Ser 1.78 (*)    Calcium 10.4 (*)    Total Bilirubin 1.3 (*)    GFR, Estimated 40 (*)    All other components within normal limits  URINALYSIS, ROUTINE W REFLEX MICROSCOPIC - Abnormal; Notable for the following components:   Glucose, UA >=500 (*)    Ketones, ur 15 (*)    Protein, ur 30 (*)    All other components within normal limits  URINALYSIS, MICROSCOPIC (REFLEX) - Abnormal; Notable for the following components:   Bacteria, UA RARE (*)    All other components within normal limits  RESP PANEL BY RT-PCR (FLU A&B, COVID) [id number]  LIPASE, BLOOD    EKG EKG Interpretation  Date/Time:  Thursday June 07 2021 08:37:26 EST Ventricular Rate:  63 PR Interval:  63 QRS Duration: 178 QT Interval:  489 QTC Calculation: 501 R Axis:   -78 Text Interpretation: VENTRICULAR PACED RHYTHM Confirmed by Fredia Sorrow [phone]) on 06/07/2021 8:42:48 AM  Radiology CT Abdomen Pelvis W Contrast  Result Date: 06/07/2021 CLINICAL DATA:  Abdominal pain with nausea and vomiting. 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 RADIATION DOSE REDUCTION: This exam was performed according to the departmental dose-optimization program which includes automated exposure control, adjustment of the mA and/or kV according to patient size and/or use of iterative reconstruction technique. CONTRAST:  75mL OMNIPAQUE IOHEXOL 300 MG/ML  SOLN COMPARISON:  08/09/2020,  02/14/2021 FINDINGS: Lower chest: No acute abnormality. Hepatobiliary: No focal liver abnormality is seen. Tiny stones again noted within the gallbladder. Gallbladder appears otherwise unremarkable. No biliary dilatation. Pancreas:  Unremarkable. No pancreatic ductal dilatation or surrounding inflammatory changes. Spleen: Normal in size without focal abnormality. Adrenals/Urinary Tract: Unremarkable adrenal glands. Interval changes of cryo ablation involving the interpolar aspect of the left kidney at site of previously seen enhancing mass. No new solid or enhancing component is evident on the current exam. Small cyst at the inferior pole. Chronic scarring in atrophy of the right kidney. No renal stone or hydronephrosis. Urinary bladder within normal limits. Stomach/Bowel: Stomach is within normal limits. Appendix appears normal. No evidence of bowel wall thickening, distention, or inflammatory changes. Vascular/Lymphatic: Aortic atherosclerosis. No enlarged abdominal or pelvic lymph nodes. Reproductive: Prostate is unremarkable. Other: Fat containing umbilical and left inguinal hernias. No ascites. No pneumoperitoneum. Musculoskeletal: No acute or significant osseous findings. IMPRESSION: 1. No acute abdominopelvic findings. 2. Interval changes of cryoablation involving the left kidney at site of previously seen enhancing mass. No new solid or enhancing component is evident on the current exam. 3. Cholelithiasis without evidence of acute cholecystitis. 4. Fat containing umbilical and left inguinal hernias. Aortic Atherosclerosis (ICD10-I70.0). Electronically Signed   By: Davina Poke D.O.   On: 06/07/2021 09:23   DG Chest Port 1 View  Result Date: 06/07/2021 CLINICAL DATA:  Chest pain, cough EXAM: PORTABLE CHEST 1 VIEW COMPARISON:  12/08/2020 FINDINGS: Transverse diameter of heart is within normal limits. There is prosthetic aortic valve. There is a metallic clamp in the region of left atrial appendage.  Pacemaker battery is seen in the left infraclavicular region. There are no signs of pulmonary edema or focal pulmonary consolidation. There is no pleural effusion or pneumothorax. IMPRESSION: No active disease. Electronically Signed   By: Elmer Picker M.D.   On: 06/07/2021 09:00    Procedures Procedures    Medications Ordered in ED Medications  0.9 %  sodium chloride infusion (has no administration in time range)  ondansetron (ZOFRAN) injection 4 mg (4 mg Intravenous Given 06/07/21 0838)  HYDROmorphone (DILAUDID) injection 1 mg (1 mg Intravenous Given 06/07/21 0839)  sodium chloride 0.9 % bolus 1,000 mL (1,000 mLs Intravenous New Bag/Given 06/07/21 0836)  iohexol (OMNIPAQUE) 300 MG/ML solution 100 mL (80 mLs Intravenous Contrast Given 06/07/21 0851)    ED Course/ Medical Decision Making/ A&P                           Medical Decision Making Amount and/or Complexity of Data Reviewed Labs: ordered. Radiology: ordered.  Risk Prescription drug management.   Patient with history of chronic pain.  Is on pain medication.  But new onset of abdominal pain with nausea and vomiting abdomen is distended has diffuse tenderness with guarding.  Concern for any acute abdominal process.  Patient will get labs.  Will get CT scan abdomen pelvis.  Has a history of congestive heart failure so we will get chest x-ray.  Also history of a pacemaker.  So we will get EKG.  Suspect probable admission so we will screen for COVID and influenza.  Patient be treated with hydromorphone and Zofran for pain.  And will receive some IV fluids.  Patient not showing any respiratory distress.  Oxygen saturations are 99%.  There is [place] no swelling to his lower extremities since he does have a history of CHF it is reassuring that these things are as normal as they are.   Patient lab work-up urinalysis negative for urinary tract infection.  COVID influenza testing is negative.  CBC no leukocytosis hemoglobin normal at  15.1.  Complete metabolic panel patient's GFR 40.  Little off from baseline but not significantly off.  Electrolytes normal potassium 4.3 liver function tests normal except for bilirubin of 1.3. Lipase normal at 31.  Other significant CT scan of the abdomen shows no acute process.  No acute abdominal pelvic findings.  Does show no interval changes of the cryo ablation involving the left kidney at the site of the previous seen enhancing mass.  Patient was post to have an ultrasound at [facility] for follow-up of that today.  Does have cholelithiasis without evidence of acute cholecystitis.  Clinically not consistent with biliary colic.  Does show as we could see clinically the fat-containing umbilical hernia and left inguinal hernias.  Very reassured knowing that the work-up was very negative.  Patient's abdominal pain may be a component of his chronic pain.  Because he states he does have abdominal pain at all times.  Patient does have a pain prescription in the system that would be valid until February 12.  Patient will need to reschedule his ultrasound at [facility] long for the kidney neoplastic process.  Patient feeling significantly better with IV fluids here and the antinausea medicine and the pain medicine.   Final Clinical Impression(s) / ED Diagnoses Final diagnoses:  Generalized abdominal pain  Umbilical hernia without obstruction and without gangrene  Chronic pain syndrome  Renal neoplasm    Rx / DC Orders ED Discharge Orders     None         Fredia Sorrow, MD 06/07/21 8366    Fredia Sorrow, MD 06/07/21 1026    Fredia Sorrow, MD 06/07/21 1029    Fredia Sorrow, MD 06/07/21 1030

## 2021-06-07 NOTE — ED Provider Notes (Signed)
[facility] Provider Note: Georgena Spurling, MD, FACEP  CSN: [id number] MRN: [id number] ARRIVAL: 06/07/21 at 2252 ROOM: [place]  Abdominal Pain   HISTORY OF PRESENT ILLNESS  06/07/21 11:21 PM [name] is a 71 y.o. male who is on chronic pain managed by a pain management specialist.  He receives 43, 5/325 oxycodone/APAP tablets monthly.  He was seen in the ED this morning for fairly significant abdominal pain with nausea and vomiting which began yesterday.  He has not had diarrhea with this.  Work-up was unremarkable except for chronically elevated BUN and creatinine.  CT scan showed cholelithiasis without evidence of acute cholecystitis as well as fat-containing umbilical and left inguinal hernias.  He returns with persistent pain and nausea.  The pain is diffuse and his abdomen is distended as was noted this morning.  He has had a general weakness and is spent most of the day in bed.  He has not been able to keep anything on his stomach.  He rates his pain as an 8 out of 10.   Past Medical History:  Diagnosis Date   Aortic stenosis, mild    Arthritis of hand    "just a little bit in both hands" (03/31/2012)   Asthma    "little bit" (03/31/2012)   Atrial fibrillation ([place])    dx '04; DCCV '04, placed on flecainide, failed DCCV 04/2010, flecainide stopped; s/p successful A.fib ablation 01/31/12   CHF (congestive heart failure) ([place])    Controlled type 2 diabetes mellitus without complication, without long-term current use of insulin ([place]) 12/13/2019   COPD (chronic obstructive pulmonary disease) (HCC)    DJD (degenerative joint disease)    Fibromyalgia    GERD (gastroesophageal reflux disease)    Hyperlipidemia    Hypertension    Hypothyroidism    S/P radiation   Left atrial enlargement    LA size 22mm by echo 11/21/11   Mitral regurgitation    trivial   Obstructive sleep apnea    mild by sleep study 2013; pt stated he does not have a machine  because "it wasn't bad enough for him to have one"   Pacemaker 03/31/2012   Restless leg syndrome    Second degree AV block    Splenomegaly    Visit for monitoring Tikosyn therapy 04/16/2016    Past Surgical History:  Procedure Laterality Date   AORTIC VALVE REPLACEMENT N/A 08/22/2015   Procedure: AORTIC VALVE REPLACEMENT (AVR);  Surgeon: Ivin Poot, MD;  Location: [place];  Service: Open Heart Surgery;  Laterality: N/A;   ATRIAL FIBRILLATION ABLATION  01/30/2012   PVI by Dr. Rayann Heman   ATRIAL FIBRILLATION ABLATION N/A 01/31/2012   Procedure: ATRIAL FIBRILLATION ABLATION;  Surgeon: Thompson Grayer, MD;  Location: [facility] CATH LAB;  Service: Cardiovascular;  Laterality: N/A;   ATRIAL FIBRILLATION ABLATION N/A 04/19/2020   Procedure: ATRIAL FIBRILLATION ABLATION;  Surgeon: Thompson Grayer, MD;  Location: [place] CV LAB;  Service: Cardiovascular;  Laterality: N/A;   BACK SURGERY     X 3   CARDIAC CATHETERIZATION N/A 08/09/2015   Procedure: Right/Left Heart Cath and Coronary Angiography;  Surgeon: Peter M Martinique, MD;  Location: [place] CV LAB;  Service: Cardiovascular;  Laterality: N/A;   CARDIAC CATHETERIZATION N/A 02/05/2016   Procedure: Right/Left Heart Cath and Coronary Angiography;  Surgeon: Jettie Booze, MD;  Location: [place] CV LAB;  Service: Cardiovascular;  Laterality: N/A;   CARDIAC CATHETERIZATION N/A 04/17/2016  Procedure: Right Heart Cath;  Surgeon: Jolaine Artist, MD;  Location: [place] CV LAB;  Service: Cardiovascular;  Laterality: N/A;   CARDIOVASCULAR STRESS TEST  03/12/2002   EF 48%, NO EVIDENCE OF ISCHEMIA   CARDIOVERSION  01/2012; 03/31/2012   CARDIOVERSION N/A 02/25/2012   Procedure: CARDIOVERSION;  Surgeon: Thompson Grayer, MD;  Location: [facility] CATH LAB;  Service: Cardiovascular;  Laterality: N/A;   CARDIOVERSION N/A 03/31/2012   Procedure: CARDIOVERSION;  Surgeon: Thompson Grayer, MD;  Location: [facility] CATH LAB;  Service: Cardiovascular;  Laterality: N/A;    CARDIOVERSION N/A 11/23/2015   Procedure: CARDIOVERSION;  Surgeon: Larey Dresser, MD;  Location: [place];  Service: Cardiovascular;  Laterality: N/A;   CARDIOVERSION N/A 04/08/2016   Procedure: CARDIOVERSION;  Surgeon: Jolaine Artist, MD;  Location: [place];  Service: Cardiovascular;  Laterality: N/A;   CARDIOVERSION N/A 09/14/2018   Procedure: CARDIOVERSION;  Surgeon: Josue Hector, MD;  Location: [place];  Service: Cardiovascular;  Laterality: N/A;   CARDIOVERSION N/A 10/19/2019   Procedure: CARDIOVERSION;  Surgeon: Jolaine Artist, MD;  Location: [facility] ENDOSCOPY;  Service: Cardiovascular;  Laterality: N/A;   CARDIOVERSION N/A 02/25/2020   Procedure: CARDIOVERSION;  Surgeon: Fay Records, MD;  Location: [place];  Service: Cardiovascular;  Laterality: N/A;   CARDIOVERSION N/A 05/25/2020   Procedure: CARDIOVERSION;  Surgeon: Lelon Perla, MD;  Location: [facility] ENDOSCOPY;  Service: Cardiovascular;  Laterality: N/A;   CARDIOVERSION N/A 07/10/2020   Procedure: CARDIOVERSION;  Surgeon: Jerline Pain, MD;  Location: [place];  Service: Cardiovascular;  Laterality: N/A;   CLIPPING OF ATRIAL APPENDAGE N/A 08/22/2015   Procedure: CLIPPING OF ATRIAL APPENDAGE;  Surgeon: Ivin Poot, MD;  Location: [place];  Service: Open Heart Surgery;  Laterality: N/A;   DOPPLER ECHOCARDIOGRAPHY  03/11/2002   EF 70-75%   FINGER SURGERY Left    Middle finger   FINGER TENDON REPAIR  1980's   "right little finger" (03/31/2012)   INSERT / REPLACE / REMOVE PACEMAKER     [employer]   IR RADIOLOGIST EVAL & MGMT  05/06/2019   IR RADIOLOGIST EVAL & MGMT  10/06/2019   IR RADIOLOGIST EVAL & MGMT  10/31/2020   IR RADIOLOGIST EVAL & MGMT  01/18/2021   IR RADIOLOGIST EVAL & MGMT  03/08/2021   KIDNEY SURGERY Left    LEFT AND RIGHT HEART CATHETERIZATION WITH CORONARY ANGIOGRAM N/A 10/27/2012   Procedure: LEFT AND RIGHT HEART CATHETERIZATION WITH CORONARY ANGIOGRAM;  Surgeon: Laverda Page, MD;  Location: [facility] CATH LAB;  Service: Cardiovascular;  Laterality: N/A;   LUMBAR DISC SURGERY  1980's X2;  2000's   MAZE N/A 08/22/2015   Procedure: MAZE;  Surgeon: Ivin Poot, MD;  Location: [place];  Service: Open Heart Surgery;  Laterality: N/A;   PACEMAKER INSERTION  03/31/2012   STJ Accent DR pacemaker implanted by Dr Rayann Heman   PERMANENT PACEMAKER INSERTION N/A 03/31/2012   Procedure: PERMANENT PACEMAKER INSERTION;  Surgeon: Thompson Grayer, MD;  Location: [facility] CATH LAB;  Service: Cardiovascular;  Laterality: N/A;   PPM GENERATOR CHANGEOUT N/A 08/08/2020   Procedure: PPM GENERATOR CHANGEOUT;  Surgeon: Thompson Grayer, MD;  Location: [place] CV LAB;  Service: Cardiovascular;  Laterality: N/A;   RADIOLOGY WITH ANESTHESIA Left 02/14/2021   Procedure: RADIOLOGY WITH ANESTHESIA LEFT RENAL CRYOABLATION;  Surgeon: Aletta Edouard, MD;  Location: [facility];  Service: Radiology;  Laterality: Left;   TEE WITHOUT CARDIOVERSION  01/30/2012   Procedure: TRANSESOPHAGEAL ECHOCARDIOGRAM (TEE);  Surgeon: Kirk Ruths  Claris Gladden, MD;  Location: [place];  Service: Cardiovascular;  Laterality: N/A;  ablation next day   TEE WITHOUT CARDIOVERSION N/A 08/22/2015   Procedure: TRANSESOPHAGEAL ECHOCARDIOGRAM (TEE);  Surgeon: Ivin Poot, MD;  Location: [place];  Service: Open Heart Surgery;  Laterality: N/A;   TEE WITHOUT CARDIOVERSION N/A 10/19/2019   Procedure: TRANSESOPHAGEAL ECHOCARDIOGRAM (TEE);  Surgeon: Jolaine Artist, MD;  Location: [facility] ENDOSCOPY;  Service: Cardiovascular;  Laterality: N/A;   US ECHOCARDIOGRAPHY  08/07/2009   EF 55-60%    Family History  Problem Relation Age of Onset   Heart failure Mother    Stroke Father     Social History   Tobacco Use   Smoking status: Former    Packs/day: 1.00    Years: 25.00    Pack years: 25.00    Types: Cigarettes    Start date: 1963    Quit date: 07/18/1991    Years since quitting: 29.9   Smokeless tobacco: Never  Vaping Use   Vaping Use:  Never used  Substance Use Topics   Alcohol use: Not Currently    Comment: occasional   Drug use: Yes    Types: Marijuana    Comment: rarely use    Prior to Admission medications   Medication Sig Start Date End Date Taking? Authorizing Provider  albuterol (PROVENTIL HFA;VENTOLIN HFA) 108 (90 Base) MCG/ACT inhaler Inhale 2 puffs into the lungs every 6 (six) hours as needed for wheezing or shortness of breath. 12/28/15   Collene Gobble, MD  amLODipine (NORVASC) 5 MG tablet Take 5 mg by mouth daily.    [provider]  apixaban (ELIQUIS) 5 MG TABS tablet Take 1 tablet (5 mg total) by mouth 2 (two) times daily. 05/11/21   Allred, Jeneen Rinks, MD  bisoprolol (ZEBETA) 5 MG tablet Take 1 tablet (5 mg total) by mouth daily. Needs appt 12/25/20   Bensimhon, Shaune Pascal, MD  Budeson-Glycopyrrol-Formoterol (BREZTRI AEROSPHERE) 160-9-4.8 MCG/ACT AERO Inhale 2 puffs into the lungs in the morning and at bedtime. 11/30/20   Martyn Ehrich, NP  Calcium-Magnesium-Zinc (CAL-MAG-ZINC PO) Take 1 tablet by mouth daily. Vit D    [provider]  cholecalciferol (VITAMIN D3) 25 MCG (1000 UNIT) tablet Take 4,000 Units by mouth at bedtime.    [provider]  empagliflozin (JARDIANCE) 10 MG TABS tablet Take 1 tablet (10 mg total) by mouth daily before breakfast. Patient taking differently: Take 10 mg by mouth daily. 03/14/20   Bensimhon, Shaune Pascal, MD  fexofenadine (ALLEGRA) 180 MG tablet Take 1 tablet (180 mg total) by mouth daily as needed for allergies or rhinitis. 01/23/20   Aline August, MD  gabapentin (NEURONTIN) 300 MG capsule Take 600-1,200 mg by mouth See admin instructions. Take 600 mg by mouth in the morning, 600 mg at lunchtime, and take 1200 mg at bedtime 09/13/14   [provider]  levothyroxine (SYNTHROID, LEVOTHROID) 175 MCG tablet Take 175 mcg by mouth daily before breakfast.    [provider]  LORazepam (ATIVAN) 1 MG tablet Take 1 tablet (1 mg total) by mouth every  8 (eight) hours as needed (nausea and/or restlessness). Patient taking differently: Take 1 mg by mouth daily. 08/09/20   Carmin Muskrat, MD  Menthol-Camphor (TIGER BALM ARTHRITIS RUB EX) Apply 1 application topically continuous as needed (pain).    [provider]  montelukast (SINGULAIR) 10 MG tablet Take 1 tablet (10 mg total) by mouth at bedtime. 12/11/20   Parrett, Fonnie Mu, NP  Multiple  Vitamin (MULTIVITAMIN WITH MINERALS) TABS tablet Take 1 tablet by mouth daily. 12/15/19   Raiford Noble Latif, DO  mupirocin ointment (BACTROBAN) 2 % Apply 1 application topically daily as needed (infection animal Scratch). 12/25/20   [provider]  ondansetron (ZOFRAN-ODT) 4 MG disintegrating tablet Take 4 mg by mouth every 8 (eight) hours as needed for nausea or vomiting.    [provider]  ondansetron (ZOFRAN-ODT) 4 MG disintegrating tablet Take 1 tablet (4 mg total) by mouth every 8 (eight) hours as needed for nausea or vomiting. 06/07/21   Fredia Sorrow, MD  oxyCODONE-acetaminophen (PERCOCET/ROXICET) 5-325 MG tablet Take 1 tablet by mouth every 12 (twelve) hours.    [provider]  OXYGEN Inhale 2 L into the lungs at bedtime.    [provider]  pantoprazole (PROTONIX) 40 MG tablet Take 40 mg by mouth daily.    [provider]  polyethylene glycol powder (GLYCOLAX/MIRALAX) 17 GM/SCOOP powder Take 17 g by mouth daily as needed for mild constipation. 01/23/20   Aline August, MD  Polyvinyl Alcohol-Povidone (REFRESH OP) Place 1 drop into both eyes daily as needed (dry eyes).    [provider]  potassium chloride SA (KLOR-CON) 20 MEQ tablet Take 30 mEq by mouth 2 (two) times daily. Every other day    [provider]  predniSONE (DELTASONE) 5 MG tablet Take 1 tablet (5 mg total) by mouth daily with breakfast. Patient taking differently: Take 2.5 mg by mouth daily with breakfast. 07/19/20   Martyn Ehrich, NP  promethazine (PHENERGAN) 25  MG tablet Take 1 tablet (25 mg total) by mouth every 6 (six) hours as needed for nausea or vomiting. 12/10/20   Drenda Freeze, MD  rOPINIRole (REQUIP) 1 MG tablet Take 1 tablet (1 mg total) by mouth in the morning and at bedtime. 01/23/20   Aline August, MD  rosuvastatin (CRESTOR) 10 MG tablet Take 10 mg by mouth at bedtime.  08/08/16   [provider]  spironolactone (ALDACTONE) 25 MG tablet Take 1 tablet (25 mg total) by mouth daily. 02/16/16   Shirley Friar, PA-C  torsemide (DEMADEX) 20 MG tablet Take 30 mg by mouth every other day.    [provider]  traMADol (ULTRAM) 50 MG tablet Take 100 mg by mouth 2 (two) times daily. 10/26/20   [provider]  traZODone (DESYREL) 50 MG tablet Take 50 mg by mouth at bedtime. 08/30/20   [provider]    Allergies Meloxicam and Vancomycin   REVIEW OF SYSTEMS  Negative except as noted here or in the History of Present Illness.   PHYSICAL EXAMINATION  Initial Vital Signs Blood pressure 127/84, pulse (!) 59, temperature 98.4 F (36.9 C), temperature source Oral, resp. rate 16, height 5\' 11"  (1.803 m), weight 79.4 kg, SpO2 100 %.  Examination General: Well-developed, well-nourished male in no acute distress; appearance consistent with age of record HENT: normocephalic; atraumatic Eyes: pupils equal, round and reactive to light; extraocular muscles intact Neck: supple Heart: regular rate and rhythm Lungs: clear to auscultation bilaterally Abdomen: soft; distended; diffusely tender; noticeable umbilical hernia; bowel sounds hyperactive Extremities: No deformity; full range of motion; pulses normal Neurologic: Awake, alert and oriented; motor function intact in all extremities and symmetric; no facial droop Skin: Warm and dry; chronic appearing hyperpigmentation of lower legs Psychiatric: Odd affect   RESULTS  Summary of this visit's results, reviewed and interpreted by myself:   EKG  Interpretation  Date/Time:    Ventricular  Rate:    PR Interval:    QRS Duration:   QT Interval:    QTC Calculation:   R Axis:     Text Interpretation:         Laboratory Studies: Results for orders placed or performed during the hospital encounter of 06/07/21 (from the past 24 hour(s))  CBC with Differential/Platelet     Status: Abnormal   Collection Time: 06/07/21 11:00 PM  Result Value Ref Range   WBC 9.7 4.0 - 10.5 K/uL   RBC 5.36 4.22 - 5.81 MIL/uL   Hemoglobin 15.4 13.0 - 17.0 g/dL   HCT 47.5 39.0 - 52.0 %   MCV 88.6 80.0 - 100.0 fL   MCH 28.7 26.0 - 34.0 pg   MCHC 32.4 30.0 - 36.0 g/dL   RDW 15.9 (H) 11.5 - 15.5 %   Platelets 190 150 - 400 K/uL   nRBC 0.0 0.0 - 0.2 %   Neutrophils Relative % 87 %   Neutro Abs 8.3 (H) 1.7 - 7.7 K/uL   Lymphocytes Relative 7 %   Lymphs Abs 0.6 (L) 0.7 - 4.0 K/uL   Monocytes Relative 6 %   Monocytes Absolute 0.6 0.1 - 1.0 K/uL   Eosinophils Relative 0 %   Eosinophils Absolute 0.0 0.0 - 0.5 K/uL   Basophils Relative 0 %   Basophils Absolute 0.0 0.0 - 0.1 K/uL   Immature Granulocytes 0 %   Abs Immature Granulocytes 0.04 0.00 - 0.07 K/uL  Comprehensive metabolic panel     Status: Abnormal   Collection Time: 06/07/21 11:00 PM  Result Value Ref Range   Sodium 138 135 - 145 mmol/L   Potassium 4.5 3.5 - 5.1 mmol/L   Chloride 100 98 - 111 mmol/L   CO2 25 22 - 32 mmol/L   Glucose, Bld 125 (H) 70 - 99 mg/dL   BUN 26 (H) 8 - 23 mg/dL   Creatinine, Ser 1.85 (H) 0.61 - 1.24 mg/dL   Calcium 10.0 8.9 - 10.3 mg/dL   Total Protein 8.0 6.5 - 8.1 g/dL   Albumin 4.6 3.5 - 5.0 g/dL   AST 24 15 - 41 U/L   ALT 20 0 - 44 U/L   Alkaline Phosphatase 76 38 - 126 U/L   Total Bilirubin 1.4 (H) 0.3 - 1.2 mg/dL   GFR, Estimated 38 (L) >60 mL/min   Anion gap 13 5 - 15  Lipase, blood     Status: None   Collection Time: 06/07/21 11:00 PM  Result Value Ref Range   Lipase 30 11 - 51 U/L  Urinalysis, Routine w reflex microscopic     Status: Abnormal    Collection Time: 06/08/21  1:42 AM  Result Value Ref Range   Color, Urine YELLOW YELLOW   APPearance CLEAR CLEAR   Specific Gravity, Urine 1.015 1.005 - 1.030   pH 7.0 5.0 - 8.0   Glucose, UA >=500 (A) NEGATIVE mg/dL   Hgb urine dipstick NEGATIVE NEGATIVE   Bilirubin Urine NEGATIVE NEGATIVE   Ketones, ur 15 (A) NEGATIVE mg/dL   Protein, ur 100 (A) NEGATIVE mg/dL   Nitrite NEGATIVE NEGATIVE   Leukocytes,Ua NEGATIVE NEGATIVE  Urinalysis, Microscopic (reflex)     Status: Abnormal   Collection Time: 06/08/21  1:42 AM  Result Value Ref Range   RBC / HPF 0-5 0 - 5 RBC/hpf   WBC, UA 0-5 0 - 5 WBC/hpf   Bacteria, UA RARE (A) NONE SEEN   Squamous Epithelial / LPF 0-5 0 -  5   Imaging Studies: CT Abdomen Pelvis W Contrast  Result Date: 06/07/2021 CLINICAL DATA:  Abdominal pain with nausea and vomiting. 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 RADIATION DOSE REDUCTION: This exam was performed according to the departmental dose-optimization program which includes automated exposure control, adjustment of the mA and/or kV according to patient size and/or use of iterative reconstruction technique. CONTRAST:  55mL OMNIPAQUE IOHEXOL 300 MG/ML  SOLN COMPARISON:  08/09/2020, 02/14/2021 FINDINGS: Lower chest: No acute abnormality. Hepatobiliary: No focal liver abnormality is seen. Tiny stones again noted within the gallbladder. Gallbladder appears otherwise unremarkable. No biliary dilatation. Pancreas: Unremarkable. No pancreatic ductal dilatation or surrounding inflammatory changes. Spleen: Normal in size without focal abnormality. Adrenals/Urinary Tract: Unremarkable adrenal glands. Interval changes of cryo ablation involving the interpolar aspect of the left kidney  at site of previously seen enhancing mass. No new solid or enhancing component is evident on the current exam. Small cyst at the inferior pole. Chronic scarring in atrophy of the right kidney. No renal stone or hydronephrosis. Urinary bladder within normal limits. Stomach/Bowel: Stomach is within normal limits. Appendix appears normal. No evidence of bowel wall thickening, distention, or inflammatory changes. Vascular/Lymphatic: Aortic atherosclerosis. No enlarged abdominal or pelvic lymph nodes. Reproductive: Prostate is unremarkable. Other: Fat containing umbilical and left inguinal hernias. No ascites. No pneumoperitoneum. Musculoskeletal: No acute or significant osseous findings. IMPRESSION: 1. No acute abdominopelvic findings. 2. Interval changes of cryoablation involving the left kidney at site of previously seen enhancing mass. No new solid or enhancing component is evident on the current exam. 3. Cholelithiasis without evidence of acute cholecystitis. 4. Fat containing umbilical and left inguinal hernias. Aortic Atherosclerosis (ICD10-I70.0). Electronically Signed   By: Davina Poke D.O.   On: 06/07/2021 09:23   DG Chest Port 1 View  Result Date: 06/07/2021 CLINICAL DATA:  Chest pain, cough EXAM: PORTABLE CHEST 1 VIEW COMPARISON:  12/08/2020 FINDINGS: Transverse diameter of heart is within normal limits. There is prosthetic aortic valve. There is a metallic clamp in the region of left atrial appendage. Pacemaker battery is seen in the left infraclavicular region. There are no signs of pulmonary edema or focal pulmonary consolidation. There is no pleural effusion or pneumothorax. IMPRESSION: No active disease. Electronically Signed   By: Elmer Picker M.D.   On: 06/07/2021 09:00   US Abdomen Limited RUQ (LIVER/GB)  Result Date: 06/08/2021 CLINICAL DATA:  Right upper quadrant abdominal pain EXAM: ULTRASOUND ABDOMEN LIMITED RIGHT UPPER QUADRANT COMPARISON:  CT abdomen/pelvis dated 06/07/2021  FINDINGS: Gallbladder: 6 mm gallstone within the gallbladder neck. No gallbladder wall thickening or pericholecystic fluid. Negative sonographic Murphy's sign. Common bile duct: Diameter: 4 mm Liver: No focal lesion identified. Within normal limits in parenchymal echogenicity. Portal vein is patent on color Doppler imaging with normal direction of blood flow towards the liver. Other: Right renal atrophy with increased parenchymal echogenicity. IMPRESSION: Cholelithiasis, without associated sonographic findings to suggest acute cholecystitis. Electronically Signed   By: Julian Hy M.D.   On: 06/08/2021 00:52    ED COURSE and MDM  Nursing notes, initial and subsequent vitals signs, including pulse oximetry, reviewed and interpreted by myself.  Vitals:   06/07/21 2302 06/07/21 2358 06/08/21 0000 06/08/21 0130  BP: 127/84   140/69  Pulse: (!) 59 (!) 59 62 60  Resp:  16   16  Temp: 98.4 F (36.9 C)     TempSrc: Oral     SpO2: 100% 96% 93% 97%  Weight:      Height:       Medications  ondansetron (ZOFRAN) injection 4 mg (4 mg Intravenous Given 06/07/21 2358)  fentaNYL (SUBLIMAZE) injection 100 mcg (100 mcg Intravenous Given 06/07/21 2359)  fentaNYL (SUBLIMAZE) injection 100 mcg (100 mcg Intravenous Given 06/08/21 0139)  sodium chloride 0.9 % bolus 250 mL (0 mLs Intravenous Stopped 06/08/21 0216)  dicyclomine (BENTYL) injection 10 mg (10 mg Intramuscular Given 06/08/21 0212)   2:54 AM Patient feeling significant improvement after second dose of fentanyl and IM Bentyl.  CT scan and ultrasound are reassuring.  He does have gallstones but no evidence of cholecystitis.  His laboratory studies are also reassuring.  I suspect he likely has a viral gastrointestinal illness although he is not febrile and has no leukocytosis.  He was advised to return if symptoms worsen or change.  We will provide Bentyl for symptomatic relief of his pain.  He is already on scheduled oxycodone.   PROCEDURES   Procedures   ED DIAGNOSES     ICD-10-CM   1. Generalized abdominal pain  R10.84                2. Nausea and vomiting in adult  R11.2     3. Calculus of gallbladder without cholecystitis without obstruction  K80.20          Sitara Cashwell, MD 06/08/21 [phone]

## 2021-06-07 NOTE — ED Triage Notes (Signed)
C/o cont abd pain with n/v , seen this am for same

## 2021-06-07 NOTE — ED Triage Notes (Signed)
N/V since yesterday and pain all over

## 2021-06-07 NOTE — ED Notes (Signed)
Pt in bed, sig other at bedside, pt states that since yesterday he has been having vomiting, abd pain and general body aches, denies diarrhea, pt abd is distended and tender, pt has umbilical hernia that is soft; however, pt reports pain at hernia site.  Resps even and unlabored, iv placed, blood drawn and sent.

## 2021-06-08 ENCOUNTER — Observation Stay (HOSPITAL_BASED_OUTPATIENT_CLINIC_OR_DEPARTMENT_OTHER)
Admission: EM | Admit: 2021-06-08 | Discharge: 2021-06-11 | Disposition: A | Discharge status: Home / Self Care | Payer: Medicare Other | Attending: Internal Medicine | Admitting: Internal Medicine

## 2021-06-08 ENCOUNTER — Emergency Department (HOSPITAL_BASED_OUTPATIENT_CLINIC_OR_DEPARTMENT_OTHER): Payer: Medicare Other

## 2021-06-08 ENCOUNTER — Encounter (HOSPITAL_BASED_OUTPATIENT_CLINIC_OR_DEPARTMENT_OTHER): Payer: Self-pay

## 2021-06-08 ENCOUNTER — Other Ambulatory Visit: Payer: Self-pay

## 2021-06-08 DIAGNOSIS — I4892 Unspecified atrial flutter: Secondary | ICD-10-CM | POA: Insufficient documentation

## 2021-06-08 DIAGNOSIS — J441 Chronic obstructive pulmonary disease with (acute) exacerbation: Principal | ICD-10-CM | POA: Insufficient documentation

## 2021-06-08 DIAGNOSIS — Z20822 Contact with and (suspected) exposure to covid-19: Secondary | ICD-10-CM | POA: Insufficient documentation

## 2021-06-08 DIAGNOSIS — R531 Weakness: Secondary | ICD-10-CM

## 2021-06-08 DIAGNOSIS — E119 Type 2 diabetes mellitus without complications: Secondary | ICD-10-CM | POA: Diagnosis present

## 2021-06-08 DIAGNOSIS — R778 Other specified abnormalities of plasma proteins: Secondary | ICD-10-CM

## 2021-06-08 DIAGNOSIS — Z7901 Long term (current) use of anticoagulants: Secondary | ICD-10-CM | POA: Insufficient documentation

## 2021-06-08 DIAGNOSIS — N1832 Chronic kidney disease, stage 3b: Secondary | ICD-10-CM | POA: Diagnosis not present

## 2021-06-08 DIAGNOSIS — N183 Chronic kidney disease, stage 3 unspecified: Secondary | ICD-10-CM | POA: Diagnosis present

## 2021-06-08 DIAGNOSIS — I483 Typical atrial flutter: Secondary | ICD-10-CM | POA: Diagnosis present

## 2021-06-08 DIAGNOSIS — G9341 Metabolic encephalopathy: Secondary | ICD-10-CM | POA: Diagnosis not present

## 2021-06-08 DIAGNOSIS — R1084 Generalized abdominal pain: Secondary | ICD-10-CM

## 2021-06-08 DIAGNOSIS — I5032 Chronic diastolic (congestive) heart failure: Secondary | ICD-10-CM | POA: Diagnosis present

## 2021-06-08 DIAGNOSIS — R112 Nausea with vomiting, unspecified: Secondary | ICD-10-CM

## 2021-06-08 DIAGNOSIS — F1220 Cannabis dependence, uncomplicated: Secondary | ICD-10-CM | POA: Diagnosis present

## 2021-06-08 DIAGNOSIS — R109 Unspecified abdominal pain: Secondary | ICD-10-CM | POA: Diagnosis present

## 2021-06-08 DIAGNOSIS — Z950 Presence of cardiac pacemaker: Secondary | ICD-10-CM | POA: Diagnosis not present

## 2021-06-08 DIAGNOSIS — Z79899 Other long term (current) drug therapy: Secondary | ICD-10-CM | POA: Diagnosis not present

## 2021-06-08 DIAGNOSIS — E1122 Type 2 diabetes mellitus with diabetic chronic kidney disease: Secondary | ICD-10-CM | POA: Insufficient documentation

## 2021-06-08 DIAGNOSIS — J449 Chronic obstructive pulmonary disease, unspecified: Secondary | ICD-10-CM | POA: Diagnosis present

## 2021-06-08 DIAGNOSIS — Z87891 Personal history of nicotine dependence: Secondary | ICD-10-CM | POA: Insufficient documentation

## 2021-06-08 DIAGNOSIS — I10 Essential (primary) hypertension: Secondary | ICD-10-CM | POA: Diagnosis present

## 2021-06-08 DIAGNOSIS — I130 Hypertensive heart and chronic kidney disease with heart failure and stage 1 through stage 4 chronic kidney disease, or unspecified chronic kidney disease: Secondary | ICD-10-CM | POA: Insufficient documentation

## 2021-06-08 DIAGNOSIS — R079 Chest pain, unspecified: Secondary | ICD-10-CM | POA: Diagnosis present

## 2021-06-08 DIAGNOSIS — Z66 Do not resuscitate: Secondary | ICD-10-CM | POA: Diagnosis present

## 2021-06-08 DIAGNOSIS — E039 Hypothyroidism, unspecified: Secondary | ICD-10-CM | POA: Diagnosis not present

## 2021-06-08 DIAGNOSIS — M79605 Pain in left leg: Secondary | ICD-10-CM

## 2021-06-08 LAB — COMPREHENSIVE METABOLIC PANEL
ALT: 20 U/L (ref 0–44)
AST: 24 U/L (ref 15–41)
Albumin: 4.6 g/dL (ref 3.5–5.0)
Alkaline Phosphatase: 76 U/L (ref 38–126)
Anion gap: 13 (ref 5–15)
BUN: 26 mg/dL — ABNORMAL HIGH (ref 8–23)
CO2: 25 mmol/L (ref 22–32)
Calcium: 10 mg/dL (ref 8.9–10.3)
Chloride: 100 mmol/L (ref 98–111)
Creatinine, Ser: 1.85 mg/dL — ABNORMAL HIGH (ref 0.61–1.24)
GFR, Estimated: 38 mL/min — ABNORMAL LOW (ref >60)
Glucose, Bld: 125 mg/dL — ABNORMAL HIGH (ref 70–99)
Potassium: 4.5 mmol/L (ref 3.5–5.1)
Sodium: 138 mmol/L (ref 135–145)
Total Bilirubin: 1.4 mg/dL — ABNORMAL HIGH (ref 0.3–1.2)
Total Protein: 8 g/dL (ref 6.5–8.1)

## 2021-06-08 LAB — CBC
HCT: 45.9 % (ref 39.0–52.0)
Hemoglobin: 14.9 g/dL (ref 13.0–17.0)
MCH: 28.5 pg (ref 26.0–34.0)
MCHC: 32.5 g/dL (ref 30.0–36.0)
MCV: 87.9 fL (ref 80.0–100.0)
Platelets: 203 10*3/uL (ref 150–400)
RBC: 5.22 MIL/uL (ref 4.22–5.81)
RDW: 16 % — ABNORMAL HIGH (ref 11.5–15.5)
WBC: 10.8 10*3/uL — ABNORMAL HIGH (ref 4.0–10.5)
nRBC: 0 % (ref 0.0–0.2)

## 2021-06-08 LAB — BASIC METABOLIC PANEL
Anion gap: 13 (ref 5–15)
BUN: 29 mg/dL — ABNORMAL HIGH (ref 8–23)
CO2: 22 mmol/L (ref 22–32)
Calcium: 10.2 mg/dL (ref 8.9–10.3)
Chloride: 103 mmol/L (ref 98–111)
Creatinine, Ser: 1.89 mg/dL — ABNORMAL HIGH (ref 0.61–1.24)
GFR, Estimated: 37 mL/min — ABNORMAL LOW (ref >60)
Glucose, Bld: 115 mg/dL — ABNORMAL HIGH (ref 70–99)
Potassium: 4.2 mmol/L (ref 3.5–5.1)
Sodium: 138 mmol/L (ref 135–145)

## 2021-06-08 LAB — BRAIN NATRIURETIC PEPTIDE: B Natriuretic Peptide: 322.7 pg/mL — ABNORMAL HIGH (ref 0.0–100.0)

## 2021-06-08 LAB — URINALYSIS, MICROSCOPIC (REFLEX)

## 2021-06-08 LAB — URINALYSIS, ROUTINE W REFLEX MICROSCOPIC
Bilirubin Urine: NEGATIVE
Glucose, UA: >=500 mg/dL — AB
Hgb urine dipstick: NEGATIVE
Ketones, ur: 15 mg/dL — AB
Leukocytes,Ua: NEGATIVE
Nitrite: NEGATIVE
Protein, ur: 100 mg/dL — AB
Specific Gravity, Urine: 1.015 (ref 1.005–1.030)
pH: 7 (ref 5.0–8.0)

## 2021-06-08 LAB — TROPONIN I (HIGH SENSITIVITY)
Troponin I (High Sensitivity): 44 ng/L — ABNORMAL HIGH (ref <18)
Troponin I (High Sensitivity): 44 ng/L — ABNORMAL HIGH (ref <18)

## 2021-06-08 LAB — LIPASE, BLOOD: Lipase: 30 U/L (ref 11–51)

## 2021-06-08 MED ORDER — NITROGLYCERIN 0.4 MG SL SUBL
0.4000 mg | SUBLINGUAL_TABLET | SUBLINGUAL | Status: AC | PRN
  Administered 2021-06-08 (×3): 0.4 mg via SUBLINGUAL
  Filled 2021-06-08 (×3): qty 1

## 2021-06-08 MED ORDER — SODIUM CHLORIDE 0.9 % IV BOLUS
250.0000 mL | Freq: Once | INTRAVENOUS | Status: AC
Start: 2021-06-08 — End: 2021-06-08
  Administered 2021-06-08: 250 mL via INTRAVENOUS

## 2021-06-08 MED ORDER — DICYCLOMINE HCL 10 MG/ML IM SOLN
10.0000 mg | Freq: Once | INTRAMUSCULAR | Status: AC
Start: 2021-06-08 — End: 2021-06-08
  Administered 2021-06-08: 10 mg via INTRAMUSCULAR
  Filled 2021-06-08: qty 2

## 2021-06-08 MED ORDER — IPRATROPIUM-ALBUTEROL 0.5-2.5 (3) MG/3ML IN SOLN
3.0000 mL | RESPIRATORY_TRACT | Status: DC
  Administered 2021-06-09 – 2021-06-10 (×7): 3 mL via RESPIRATORY_TRACT
  Filled 2021-06-08 (×7): qty 3

## 2021-06-08 MED ORDER — DICYCLOMINE HCL 10 MG PO CAPS: 10.0000 mg | ORAL_CAPSULE | Freq: Four times a day (QID) | ORAL | 0 refills | Status: DC | PRN

## 2021-06-08 MED ORDER — FENTANYL CITRATE PF 50 MCG/ML IJ SOSY
100.0000 ug | PREFILLED_SYRINGE | Freq: Once | INTRAMUSCULAR | Status: AC
  Administered 2021-06-08: 100 ug via INTRAVENOUS
  Filled 2021-06-08: qty 2

## 2021-06-08 MED ORDER — MORPHINE SULFATE (PF) 4 MG/ML IV SOLN
4.0000 mg | Freq: Once | INTRAVENOUS | Status: AC
  Administered 2021-06-08: 4 mg via INTRAVENOUS
  Filled 2021-06-08: qty 1

## 2021-06-08 NOTE — ED Notes (Signed)
US at bedside

## 2021-06-08 NOTE — ED Provider Notes (Addendum)
[facility] Provider Note: Georgena Spurling, MD, FACEP  CSN: [id number] MRN: [id number] ARRIVAL: 06/08/21 at 2040 ROOM: [facility]  Chest Pain   HISTORY OF PRESENT ILLNESS  06/08/21 10:43 PM [name] is a 71 y.o. male who was seen here yesterday for generalized abdominal pain, nausea and vomiting.  Work-up included CT scan and ultrasound (cholelithiasis but no evidence of cholecystitis, no acute findings) and are unremarkable laboratory work.  COVID and flu were negative.  He was treated with Bentyl and discharged home.  He returns with continued generalized abdominal pain with nausea and vomiting as well as general malaise.  In addition he has now developed precordial chest pain.  He states this was present yesterday but he downplayed it.  This afternoon it has become more severe and he rates it as a 10 out of 10.  Nothing makes it better or worse.  He is also short of breath.  He has not taken any of his medications today except for Eliquis (he has a pacemaker and aortic valve replacement).   Past Medical History:  Diagnosis Date   Aortic stenosis, mild    Arthritis of hand    "just a little bit in both hands" (03/31/2012)   Asthma    "little bit" (03/31/2012)   Atrial fibrillation ([place])    dx '04; DCCV '04, placed on flecainide, failed DCCV 04/2010, flecainide stopped; s/p successful A.fib ablation 01/31/12   CHF (congestive heart failure) ([place])    Controlled type 2 diabetes mellitus without complication, without long-term current use of insulin ([place]) 12/13/2019   COPD (chronic obstructive pulmonary disease) (HCC)    DJD (degenerative joint disease)    Fibromyalgia    GERD (gastroesophageal reflux disease)    Hyperlipidemia    Hypertension    Hypothyroidism    S/P radiation   Left atrial enlargement    LA size 64mm by echo 11/21/11   Mitral regurgitation    trivial   Obstructive sleep apnea    mild by sleep study 2013; pt stated he does not  have a machine because "it wasn't bad enough for him to have one"   Pacemaker 03/31/2012   Restless leg syndrome    Second degree AV block    Splenomegaly    Visit for monitoring Tikosyn therapy 04/16/2016    Past Surgical History:  Procedure Laterality Date   AORTIC VALVE REPLACEMENT N/A 08/22/2015   Procedure: AORTIC VALVE REPLACEMENT (AVR);  Surgeon: Ivin Poot, MD;  Location: [facility];  Service: Open Heart Surgery;  Laterality: N/A;   ATRIAL FIBRILLATION ABLATION  01/30/2012   PVI by Dr. Rayann Heman   ATRIAL FIBRILLATION ABLATION N/A 01/31/2012   Procedure: ATRIAL FIBRILLATION ABLATION;  Surgeon: Thompson Grayer, MD;  Location: [facility] CATH LAB;  Service: Cardiovascular;  Laterality: N/A;   ATRIAL FIBRILLATION ABLATION N/A 04/19/2020   Procedure: ATRIAL FIBRILLATION ABLATION;  Surgeon: Thompson Grayer, MD;  Location: [place] CV LAB;  Service: Cardiovascular;  Laterality: N/A;   BACK SURGERY     X 3   CARDIAC CATHETERIZATION N/A 08/09/2015   Procedure: Right/Left Heart Cath and Coronary Angiography;  Surgeon: Peter M Martinique, MD;  Location: [place] CV LAB;  Service: Cardiovascular;  Laterality: N/A;   CARDIAC CATHETERIZATION N/A 02/05/2016   Procedure: Right/Left Heart Cath and Coronary Angiography;  Surgeon: Jettie Booze, MD;  Location: [place] CV LAB;  Service: Cardiovascular;  Laterality: N/A;   CARDIAC CATHETERIZATION N/A 04/17/2016   Procedure:  Right Heart Cath;  Surgeon: Jolaine Artist, MD;  Location: [place] CV LAB;  Service: Cardiovascular;  Laterality: N/A;   CARDIOVASCULAR STRESS TEST  03/12/2002   EF 48%, NO EVIDENCE OF ISCHEMIA   CARDIOVERSION  01/2012; 03/31/2012   CARDIOVERSION N/A 02/25/2012   Procedure: CARDIOVERSION;  Surgeon: Thompson Grayer, MD;  Location: [facility] CATH LAB;  Service: Cardiovascular;  Laterality: N/A;   CARDIOVERSION N/A 03/31/2012   Procedure: CARDIOVERSION;  Surgeon: Thompson Grayer, MD;  Location: [facility] CATH LAB;  Service: Cardiovascular;   Laterality: N/A;   CARDIOVERSION N/A 11/23/2015   Procedure: CARDIOVERSION;  Surgeon: Larey Dresser, MD;  Location: [place];  Service: Cardiovascular;  Laterality: N/A;   CARDIOVERSION N/A 04/08/2016   Procedure: CARDIOVERSION;  Surgeon: Jolaine Artist, MD;  Location: [place];  Service: Cardiovascular;  Laterality: N/A;   CARDIOVERSION N/A 09/14/2018   Procedure: CARDIOVERSION;  Surgeon: Josue Hector, MD;  Location: [place];  Service: Cardiovascular;  Laterality: N/A;   CARDIOVERSION N/A 10/19/2019   Procedure: CARDIOVERSION;  Surgeon: Jolaine Artist, MD;  Location: [facility] ENDOSCOPY;  Service: Cardiovascular;  Laterality: N/A;   CARDIOVERSION N/A 02/25/2020   Procedure: CARDIOVERSION;  Surgeon: Fay Records, MD;  Location: [place];  Service: Cardiovascular;  Laterality: N/A;   CARDIOVERSION N/A 05/25/2020   Procedure: CARDIOVERSION;  Surgeon: Lelon Perla, MD;  Location: [facility] ENDOSCOPY;  Service: Cardiovascular;  Laterality: N/A;   CARDIOVERSION N/A 07/10/2020   Procedure: CARDIOVERSION;  Surgeon: Jerline Pain, MD;  Location: [place];  Service: Cardiovascular;  Laterality: N/A;   CLIPPING OF ATRIAL APPENDAGE N/A 08/22/2015   Procedure: CLIPPING OF ATRIAL APPENDAGE;  Surgeon: Ivin Poot, MD;  Location: [place];  Service: Open Heart Surgery;  Laterality: N/A;   DOPPLER ECHOCARDIOGRAPHY  03/11/2002   EF 70-75%   FINGER SURGERY Left    Middle finger   FINGER TENDON REPAIR  1980's   "right little finger" (03/31/2012)   INSERT / REPLACE / REMOVE PACEMAKER     [employer]   IR RADIOLOGIST EVAL & MGMT  05/06/2019   IR RADIOLOGIST EVAL & MGMT  10/06/2019   IR RADIOLOGIST EVAL & MGMT  10/31/2020   IR RADIOLOGIST EVAL & MGMT  01/18/2021   IR RADIOLOGIST EVAL & MGMT  03/08/2021   KIDNEY SURGERY Left    LEFT AND RIGHT HEART CATHETERIZATION WITH CORONARY ANGIOGRAM N/A 10/27/2012   Procedure: LEFT AND RIGHT HEART CATHETERIZATION WITH CORONARY ANGIOGRAM;   Surgeon: Laverda Page, MD;  Location: [facility] CATH LAB;  Service: Cardiovascular;  Laterality: N/A;   LUMBAR DISC SURGERY  1980's X2;  2000's   MAZE N/A 08/22/2015   Procedure: MAZE;  Surgeon: Ivin Poot, MD;  Location: [place];  Service: Open Heart Surgery;  Laterality: N/A;   PACEMAKER INSERTION  03/31/2012   STJ Accent DR pacemaker implanted by Dr Rayann Heman   PERMANENT PACEMAKER INSERTION N/A 03/31/2012   Procedure: PERMANENT PACEMAKER INSERTION;  Surgeon: Thompson Grayer, MD;  Location: [facility] CATH LAB;  Service: Cardiovascular;  Laterality: N/A;   PPM GENERATOR CHANGEOUT N/A 08/08/2020   Procedure: PPM GENERATOR CHANGEOUT;  Surgeon: Thompson Grayer, MD;  Location: [place] CV LAB;  Service: Cardiovascular;  Laterality: N/A;   RADIOLOGY WITH ANESTHESIA Left 02/14/2021   Procedure: RADIOLOGY WITH ANESTHESIA LEFT RENAL CRYOABLATION;  Surgeon: Aletta Edouard, MD;  Location: [facility];  Service: Radiology;  Laterality: Left;   TEE WITHOUT CARDIOVERSION  01/30/2012   Procedure: TRANSESOPHAGEAL ECHOCARDIOGRAM (TEE);  Surgeon: Elby Showers  Aundra Dubin, MD;  Location: [place];  Service: Cardiovascular;  Laterality: N/A;  ablation next day   TEE WITHOUT CARDIOVERSION N/A 08/22/2015   Procedure: TRANSESOPHAGEAL ECHOCARDIOGRAM (TEE);  Surgeon: Ivin Poot, MD;  Location: [place];  Service: Open Heart Surgery;  Laterality: N/A;   TEE WITHOUT CARDIOVERSION N/A 10/19/2019   Procedure: TRANSESOPHAGEAL ECHOCARDIOGRAM (TEE);  Surgeon: Jolaine Artist, MD;  Location: [facility] ENDOSCOPY;  Service: Cardiovascular;  Laterality: N/A;   US ECHOCARDIOGRAPHY  08/07/2009   EF 55-60%    Family History  Problem Relation Age of Onset   Heart failure Mother    Stroke Father     Social History   Tobacco Use   Smoking status: Former    Packs/day: 1.00    Years: 25.00    Pack years: 25.00    Types: Cigarettes    Start date: 1963    Quit date: 07/18/1991    Years since quitting: 29.9   Smokeless tobacco: Never  Vaping  Use   Vaping Use: Never used  Substance Use Topics   Alcohol use: Not Currently    Comment: occasional   Drug use: Yes    Types: Marijuana    Comment: rarely use    Prior to Admission medications   Medication Sig Start Date End Date Taking? Authorizing Provider  albuterol (PROVENTIL HFA;VENTOLIN HFA) 108 (90 Base) MCG/ACT inhaler Inhale 2 puffs into the lungs every 6 (six) hours as needed for wheezing or shortness of breath. 12/28/15   Collene Gobble, MD  amLODipine (NORVASC) 5 MG tablet Take 5 mg by mouth daily.    [provider]  apixaban (ELIQUIS) 5 MG TABS tablet Take 1 tablet (5 mg total) by mouth 2 (two) times daily. 05/11/21   Allred, Jeneen Rinks, MD  bisoprolol (ZEBETA) 5 MG tablet Take 1 tablet (5 mg total) by mouth daily. Needs appt 12/25/20   Bensimhon, Shaune Pascal, MD  Budeson-Glycopyrrol-Formoterol (BREZTRI AEROSPHERE) 160-9-4.8 MCG/ACT AERO Inhale 2 puffs into the lungs in the morning and at bedtime. 11/30/20   Martyn Ehrich, NP  Calcium-Magnesium-Zinc (CAL-MAG-ZINC PO) Take 1 tablet by mouth daily. Vit D    [provider]  cholecalciferol (VITAMIN D3) 25 MCG (1000 UNIT) tablet Take 4,000 Units by mouth at bedtime.    [provider]  dicyclomine (BENTYL) 10 MG capsule Take 1 capsule (10 mg total) by mouth every 6 (six) hours as needed (For abdominal cramping). 06/08/21   Max Nuno, MD  empagliflozin (JARDIANCE) 10 MG TABS tablet Take 1 tablet (10 mg total) by mouth daily before breakfast. Patient taking differently: Take 10 mg by mouth daily. 03/14/20   Bensimhon, Shaune Pascal, MD  fexofenadine (ALLEGRA) 180 MG tablet Take 1 tablet (180 mg total) by mouth daily as needed for allergies or rhinitis. 01/23/20   Aline August, MD  gabapentin (NEURONTIN) 300 MG capsule Take 600-1,200 mg by mouth See admin instructions. Take 600 mg by mouth in the morning, 600 mg at lunchtime, and take 1200 mg at bedtime 09/13/14   [provider]  levothyroxine (SYNTHROID,  LEVOTHROID) 175 MCG tablet Take 175 mcg by mouth daily before breakfast.    [provider]  LORazepam (ATIVAN) 1 MG tablet Take 1 tablet (1 mg total) by mouth every 8 (eight) hours as needed (nausea and/or restlessness). Patient taking differently: Take 1 mg by mouth daily. 08/09/20   Carmin Muskrat, MD  Menthol-Camphor (TIGER BALM ARTHRITIS RUB EX) Apply 1 application topically continuous as needed (pain).  [provider]  montelukast (SINGULAIR) 10 MG tablet Take 1 tablet (10 mg total) by mouth at bedtime. 12/11/20   Parrett, Fonnie Mu, NP  Multiple Vitamin (MULTIVITAMIN WITH MINERALS) TABS tablet Take 1 tablet by mouth daily. 12/15/19   Raiford Noble Latif, DO  mupirocin ointment (BACTROBAN) 2 % Apply 1 application topically daily as needed (infection animal Scratch). 12/25/20   [provider]  ondansetron (ZOFRAN-ODT) 4 MG disintegrating tablet Take 4 mg by mouth every 8 (eight) hours as needed for nausea or vomiting.    [provider]  ondansetron (ZOFRAN-ODT) 4 MG disintegrating tablet Take 1 tablet (4 mg total) by mouth every 8 (eight) hours as needed for nausea or vomiting. 06/07/21   Fredia Sorrow, MD  oxyCODONE-acetaminophen (PERCOCET/ROXICET) 5-325 MG tablet Take 1 tablet by mouth every 12 (twelve) hours.    [provider]  OXYGEN Inhale 2 L into the lungs at bedtime.    [provider]  pantoprazole (PROTONIX) 40 MG tablet Take 40 mg by mouth daily.    [provider]  polyethylene glycol powder (GLYCOLAX/MIRALAX) 17 GM/SCOOP powder Take 17 g by mouth daily as needed for mild constipation. 01/23/20   Aline August, MD  Polyvinyl Alcohol-Povidone (REFRESH OP) Place 1 drop into both eyes daily as needed (dry eyes).    [provider]  potassium chloride SA (KLOR-CON) 20 MEQ tablet Take 30 mEq by mouth 2 (two) times daily. Every other day    [provider]  predniSONE (DELTASONE) 5 MG tablet Take 1 tablet (5  mg total) by mouth daily with breakfast. Patient taking differently: Take 2.5 mg by mouth daily with breakfast. 07/19/20   Martyn Ehrich, NP  promethazine (PHENERGAN) 25 MG tablet Take 1 tablet (25 mg total) by mouth every 6 (six) hours as needed for nausea or vomiting. 12/10/20   Drenda Freeze, MD  rOPINIRole (REQUIP) 1 MG tablet Take 1 tablet (1 mg total) by mouth in the morning and at bedtime. 01/23/20   Aline August, MD  rosuvastatin (CRESTOR) 10 MG tablet Take 10 mg by mouth at bedtime.  08/08/16   [provider]  spironolactone (ALDACTONE) 25 MG tablet Take 1 tablet (25 mg total) by mouth daily. 02/16/16   Shirley Friar, PA-C  torsemide (DEMADEX) 20 MG tablet Take 30 mg by mouth every other day.    [provider]  traMADol (ULTRAM) 50 MG tablet Take 100 mg by mouth 2 (two) times daily. 10/26/20   [provider]  traZODone (DESYREL) 50 MG tablet Take 50 mg by mouth at bedtime. 08/30/20   [provider]    Allergies Meloxicam and Vancomycin   REVIEW OF SYSTEMS  Negative except as noted here or in the History of Present Illness.   PHYSICAL EXAMINATION  Initial Vital Signs Blood pressure (!) 154/74, pulse 69, temperature 98 F (36.7 C), temperature source Oral, resp. rate 18, SpO2 96 %.  Examination General: Well-developed, well-nourished male in no acute distress; appearance consistent with age of record HENT: normocephalic; atraumatic Eyes: pupils equal, round and reactive to light; extraocular muscles intact Neck: supple Heart: regular rate and rhythm Lungs: Coarse inspiratory and expiratory rhonchi throughout; tachypnea Abdomen: soft; nondistended; diffusely tender; reducible umbilical hernia; bowel sounds present Extremities: No deformity; full range of motion; pulses normal Neurologic: Awake, alert; motor function intact in all extremities and symmetric; no facial droop Skin: Warm and dry; chronic appearing  hyperpigmentation of the lower legs Psychiatric: Normal mood and affect  RESULTS  Summary of this visit's results, reviewed and interpreted by myself:   EKG Interpretation  Date/Time:    Ventricular Rate:    PR Interval:    QRS Duration:   QT Interval:    QTC Calculation:   R Axis:     Text Interpretation:         Laboratory Studies: Results for orders placed or performed during the hospital encounter of 06/08/21 (from the past 24 hour(s))  Basic metabolic panel     Status: Abnormal   Collection Time: 06/08/21  8:59 PM  Result Value Ref Range   Sodium 138 135 - 145 mmol/L   Potassium 4.2 3.5 - 5.1 mmol/L   Chloride 103 98 - 111 mmol/L   CO2 22 22 - 32 mmol/L   Glucose, Bld 115 (H) 70 - 99 mg/dL   BUN 29 (H) 8 - 23 mg/dL   Creatinine, Ser 1.89 (H) 0.61 - 1.24 mg/dL   Calcium 10.2 8.9 - 10.3 mg/dL   GFR, Estimated 37 (L) >60 mL/min   Anion gap 13 5 - 15  CBC     Status: Abnormal   Collection Time: 06/08/21  8:59 PM  Result Value Ref Range   WBC 10.8 (H) 4.0 - 10.5 K/uL   RBC 5.22 4.22 - 5.81 MIL/uL   Hemoglobin 14.9 13.0 - 17.0 g/dL   HCT 45.9 39.0 - 52.0 %   MCV 87.9 80.0 - 100.0 fL   MCH 28.5 26.0 - 34.0 pg   MCHC 32.5 30.0 - 36.0 g/dL   RDW 16.0 (H) 11.5 - 15.5 %   Platelets 203 150 - 400 K/uL   nRBC 0.0 0.0 - 0.2 %  Troponin I (High Sensitivity)     Status: Abnormal   Collection Time: 06/08/21  8:59 PM  Result Value Ref Range   Troponin I (High Sensitivity) 44 (H) <18 ng/L  Troponin I (High Sensitivity)     Status: Abnormal   Collection Time: 06/08/21 10:54 PM  Result Value Ref Range   Troponin I (High Sensitivity) 44 (H) <18 ng/L  Brain natriuretic peptide     Status: Abnormal   Collection Time: 06/08/21 11:09 PM  Result Value Ref Range   B Natriuretic Peptide 322.7 (H) 0.0 - 100.0 pg/mL   Imaging Studies: CT Abdomen Pelvis W Contrast  Result Date: 06/07/2021 CLINICAL DATA:  Abdominal pain with nausea and vomiting. 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 RADIATION DOSE REDUCTION: This exam was performed according to the departmental dose-optimization program which includes automated exposure control, adjustment of the mA and/or kV according to patient size and/or use of iterative reconstruction technique. CONTRAST:  90mL OMNIPAQUE IOHEXOL 300 MG/ML  SOLN COMPARISON:  08/09/2020, 02/14/2021 FINDINGS: Lower chest: No acute abnormality. Hepatobiliary: No focal liver abnormality is seen. Tiny stones again noted within the gallbladder. Gallbladder appears otherwise unremarkable. No biliary dilatation. Pancreas: Unremarkable. No pancreatic ductal dilatation or surrounding inflammatory changes. Spleen: Normal in size without focal abnormality. Adrenals/Urinary Tract: Unremarkable adrenal glands. Interval changes of cryo ablation involving the interpolar aspect of the left kidney at site of previously seen enhancing mass. No new solid or enhancing component is evident on the current exam. Small cyst at the inferior pole. Chronic scarring in atrophy of the right kidney. No renal stone or hydronephrosis.  Urinary bladder within normal limits. Stomach/Bowel: Stomach is within normal limits. Appendix appears normal. No evidence of bowel wall thickening, distention, or inflammatory changes. Vascular/Lymphatic: Aortic atherosclerosis. No enlarged abdominal or pelvic lymph nodes. Reproductive: Prostate is unremarkable. Other: Fat containing umbilical and left inguinal hernias. No ascites. No pneumoperitoneum. Musculoskeletal: No acute or significant osseous findings. IMPRESSION: 1. No acute abdominopelvic findings. 2. Interval changes of cryoablation involving the left kidney at site of previously seen enhancing mass. No new  solid or enhancing component is evident on the current exam. 3. Cholelithiasis without evidence of acute cholecystitis. 4. Fat containing umbilical and left inguinal hernias. Aortic Atherosclerosis (ICD10-I70.0). Electronically Signed   By: Davina Poke D.O.   On: 06/07/2021 09:23   DG Chest Portable 1 View  Result Date: 06/08/2021 CLINICAL DATA:  Shortness of breath EXAM: PORTABLE CHEST 1 VIEW COMPARISON:  06/07/2021, 12/08/2020 FINDINGS: Post sternotomy changes. Left-sided pacing device and valve prosthesis as before. Atrial appendage clip. Borderline to mild cardiomegaly. No overt pulmonary edema. IMPRESSION: No active disease. Electronically Signed   By: Donavan Foil M.D.   On: 06/08/2021 23:34   DG Chest Port 1 View  Result Date: 06/07/2021 CLINICAL DATA:  Chest pain, cough EXAM: PORTABLE CHEST 1 VIEW COMPARISON:  12/08/2020 FINDINGS: Transverse diameter of heart is within normal limits. There is prosthetic aortic valve. There is a metallic clamp in the region of left atrial appendage. Pacemaker battery is seen in the left infraclavicular region. There are no signs of pulmonary edema or focal pulmonary consolidation. There is no pleural effusion or pneumothorax. IMPRESSION: No active disease. Electronically Signed   By: Elmer Picker M.D.   On: 06/07/2021 09:00   US Abdomen Limited RUQ (LIVER/GB)  Result Date: 06/08/2021 CLINICAL DATA:  Right upper quadrant abdominal pain EXAM: ULTRASOUND ABDOMEN LIMITED RIGHT UPPER QUADRANT COMPARISON:  CT abdomen/pelvis dated 06/07/2021 FINDINGS: Gallbladder: 6 mm gallstone within the gallbladder neck. No gallbladder wall thickening or pericholecystic fluid. Negative sonographic Murphy's sign. Common bile duct: Diameter: 4 mm Liver: No focal lesion identified. Within normal limits in parenchymal echogenicity. Portal vein is patent on color Doppler imaging with normal direction of blood flow towards the liver. Other: Right renal atrophy with increased  parenchymal echogenicity. IMPRESSION: Cholelithiasis, without associated sonographic findings to suggest acute cholecystitis. Electronically Signed   By: Julian Hy M.D.   On: 06/08/2021 00:52    ED COURSE and MDM  Nursing notes, initial and subsequent vitals signs, including pulse oximetry, reviewed and interpreted by myself.  Vitals:   06/09/21 0200 06/09/21 0300 06/09/21 0400 06/09/21 0500  BP: 134/66 (!) 141/70 (!) 144/70 (!) 144/73  Pulse:  (!) 59 (!) 59   Resp: 19 (!) 21 20 19   Temp:      TempSrc:      SpO2: 96% 94% 96% 96%  Weight:      Height:       Medications  ipratropium-albuterol (DUONEB) 0.5-2.5 (3) MG/3ML nebulizer solution 3 mL (3 mLs Nebulization Given 06/09/21 0005)  nitroGLYCERIN (NITROSTAT) SL tablet 0.4 mg (0.4 mg Sublingual Given 06/08/21 2323)  morphine (PF) 4 MG/ML injection 4 mg (4 mg Intravenous Given 06/08/21 2327)  methylPREDNISolone sodium succinate (SOLU-MEDROL) 125 mg/2 mL injection 125 mg (125 mg Intravenous Given 06/09/21 0031)   11:27 PM No relief with sublingual nitroglycerin.  Patient also complaining of pain in his left leg.  Patient has not had his usual oxycodone today.   11:33 PM Patient's troponins are flat at 44.  I suspect this  represents baseline heart disease rather than an acute event.  DuoNeb ordered for likely COPD exacerbation.  Patient is rhonchorous and tachypneic.  The cause of his generalized weakness and abdominal pain are unclear but he is failing to thrive at home.  He has not eaten today nor has he been willing to take his medications (except Eliquis, which his wife forced him to take).  He is negative for COVID and influenza as of yesterday.  His leg pain is not likely due to a DVT as he is on Eliquis.  I doubt arterial blockage as she has normal dorsalis pedis pulses bilaterally.  11:53 PM Dr. Myna Hidalgo to admit to hospitalist service.  BNP is elevated (patient did not take torsemide today) but his chest x-ray is not showing overt  pulmonary edema.  Patient given dose of Solu-Medrol for COPD exacerbation.  PROCEDURES  Procedures   ED DIAGNOSES     ICD-10-CM   1. Chest pain at rest  R07.9     2. COPD exacerbation ([place])  J44.1     3. Pain in left leg  M79.605     4. Generalized abdominal pain  R10.84     5. Nausea and vomiting in adult  R11.2     6. Generalized weakness  R53.1     7. Elevated troponin I level  R77.8         Emalee Knies, MD 06/08/21 2353    Shanon Rosser, MD 06/08/21 2355    Shanon Rosser, MD 06/09/21 [phone]

## 2021-06-08 NOTE — ED Triage Notes (Signed)
Pt c/o left side CP started yesterday-was seen here x 2 for same yesterday-NAD-to triage in w/c

## 2021-06-08 NOTE — ED Notes (Signed)
Pt wears oxygen 2 L at home. Pt placed on oxygen 2 L via  for pt comfort. Dr. Florina Ou at bedside.

## 2021-06-09 ENCOUNTER — Observation Stay (HOSPITAL_COMMUNITY): Payer: Medicare Other

## 2021-06-09 ENCOUNTER — Encounter (HOSPITAL_COMMUNITY): Payer: Self-pay | Admitting: Internal Medicine

## 2021-06-09 DIAGNOSIS — J449 Chronic obstructive pulmonary disease, unspecified: Secondary | ICD-10-CM

## 2021-06-09 DIAGNOSIS — I5032 Chronic diastolic (congestive) heart failure: Secondary | ICD-10-CM | POA: Diagnosis not present

## 2021-06-09 DIAGNOSIS — Z79899 Other long term (current) drug therapy: Secondary | ICD-10-CM | POA: Diagnosis not present

## 2021-06-09 DIAGNOSIS — Z20822 Contact with and (suspected) exposure to covid-19: Secondary | ICD-10-CM | POA: Diagnosis not present

## 2021-06-09 DIAGNOSIS — N1832 Chronic kidney disease, stage 3b: Secondary | ICD-10-CM

## 2021-06-09 DIAGNOSIS — Z87891 Personal history of nicotine dependence: Secondary | ICD-10-CM | POA: Diagnosis not present

## 2021-06-09 DIAGNOSIS — J441 Chronic obstructive pulmonary disease with (acute) exacerbation: Secondary | ICD-10-CM | POA: Diagnosis not present

## 2021-06-09 DIAGNOSIS — G9341 Metabolic encephalopathy: Secondary | ICD-10-CM

## 2021-06-09 DIAGNOSIS — I130 Hypertensive heart and chronic kidney disease with heart failure and stage 1 through stage 4 chronic kidney disease, or unspecified chronic kidney disease: Secondary | ICD-10-CM | POA: Diagnosis not present

## 2021-06-09 DIAGNOSIS — R0789 Other chest pain: Secondary | ICD-10-CM

## 2021-06-09 DIAGNOSIS — R1013 Epigastric pain: Secondary | ICD-10-CM | POA: Diagnosis not present

## 2021-06-09 DIAGNOSIS — I4892 Unspecified atrial flutter: Secondary | ICD-10-CM | POA: Diagnosis not present

## 2021-06-09 DIAGNOSIS — Z66 Do not resuscitate: Secondary | ICD-10-CM

## 2021-06-09 DIAGNOSIS — Z950 Presence of cardiac pacemaker: Secondary | ICD-10-CM | POA: Diagnosis not present

## 2021-06-09 DIAGNOSIS — E1122 Type 2 diabetes mellitus with diabetic chronic kidney disease: Secondary | ICD-10-CM

## 2021-06-09 DIAGNOSIS — I10 Essential (primary) hypertension: Secondary | ICD-10-CM

## 2021-06-09 DIAGNOSIS — F1220 Cannabis dependence, uncomplicated: Secondary | ICD-10-CM | POA: Diagnosis present

## 2021-06-09 DIAGNOSIS — N183 Chronic kidney disease, stage 3 unspecified: Secondary | ICD-10-CM

## 2021-06-09 DIAGNOSIS — R079 Chest pain, unspecified: Secondary | ICD-10-CM | POA: Diagnosis present

## 2021-06-09 DIAGNOSIS — Z7901 Long term (current) use of anticoagulants: Secondary | ICD-10-CM | POA: Diagnosis not present

## 2021-06-09 DIAGNOSIS — E039 Hypothyroidism, unspecified: Secondary | ICD-10-CM | POA: Diagnosis not present

## 2021-06-09 DIAGNOSIS — I483 Typical atrial flutter: Secondary | ICD-10-CM

## 2021-06-09 LAB — BLOOD GAS, ARTERIAL
Acid-base deficit: 4.2 mmol/L — ABNORMAL HIGH (ref 0.0–2.0)
Bicarbonate: 18.3 mmol/L — ABNORMAL LOW (ref 20.0–28.0)
FIO2: 28
O2 Saturation: 98.5 %
Patient temperature: 37
pCO2 arterial: 22.8 mmHg — ABNORMAL LOW (ref 32.0–48.0)
pH, Arterial: 7.516 — ABNORMAL HIGH (ref 7.350–7.450)
pO2, Arterial: 103 mmHg (ref 83.0–108.0)

## 2021-06-09 MED ORDER — APIXABAN 5 MG PO TABS
5.0000 mg | ORAL_TABLET | Freq: Two times a day (BID) | ORAL | Status: DC
  Administered 2021-06-09 – 2021-06-11 (×5): 5 mg via ORAL
  Filled 2021-06-09 (×4): qty 1
  Filled 2021-06-09: qty 2

## 2021-06-09 MED ORDER — PREDNISONE 2.5 MG PO TABS: 2.5000 mg | ORAL_TABLET | Freq: Every day | ORAL | Status: DC

## 2021-06-09 MED ORDER — METHYLPREDNISOLONE SODIUM SUCC 125 MG IJ SOLR
125.0000 mg | Freq: Once | INTRAMUSCULAR | Status: AC
  Administered 2021-06-09: 125 mg via INTRAVENOUS
  Filled 2021-06-09: qty 2

## 2021-06-09 MED ORDER — LEVOTHYROXINE SODIUM 75 MCG PO TABS
175.0000 ug | ORAL_TABLET | Freq: Every day | ORAL | Status: DC
  Administered 2021-06-10 – 2021-06-11 (×2): 175 ug via ORAL
  Filled 2021-06-09 (×2): qty 1

## 2021-06-09 MED ORDER — OXYCODONE-ACETAMINOPHEN 5-325 MG PO TABS: 2.0000 | ORAL_TABLET | Freq: Once | ORAL | Status: DC | PRN

## 2021-06-09 MED ORDER — BISACODYL 5 MG PO TBEC: 5.0000 mg | DELAYED_RELEASE_TABLET | Freq: Every day | ORAL | Status: DC | PRN

## 2021-06-09 MED ORDER — SODIUM CHLORIDE 0.9% FLUSH
3.0000 mL | Freq: Two times a day (BID) | INTRAVENOUS | Status: DC
  Administered 2021-06-09 – 2021-06-11 (×4): 3 mL via INTRAVENOUS

## 2021-06-09 MED ORDER — GABAPENTIN 400 MG PO CAPS
1200.0000 mg | ORAL_CAPSULE | Freq: Every day | ORAL | Status: DC
  Administered 2021-06-09 – 2021-06-10 (×2): 1200 mg via ORAL
  Filled 2021-06-09 (×3): qty 3

## 2021-06-09 MED ORDER — ALBUTEROL SULFATE HFA 108 (90 BASE) MCG/ACT IN AERS: 2.0000 | INHALATION_SPRAY | Freq: Four times a day (QID) | RESPIRATORY_TRACT | Status: DC | PRN

## 2021-06-09 MED ORDER — ACETAMINOPHEN 650 MG RE SUPP: 650.0000 mg | Freq: Four times a day (QID) | RECTAL | Status: DC | PRN

## 2021-06-09 MED ORDER — ACETAMINOPHEN 325 MG PO TABS: 650.0000 mg | ORAL_TABLET | Freq: Four times a day (QID) | ORAL | Status: DC | PRN

## 2021-06-09 MED ORDER — ONDANSETRON HCL 4 MG/2ML IJ SOLN: 4.0000 mg | Freq: Four times a day (QID) | INTRAMUSCULAR | Status: DC | PRN

## 2021-06-09 MED ORDER — LORAZEPAM 1 MG PO TABS: 1.0000 mg | ORAL_TABLET | Freq: Three times a day (TID) | ORAL | Status: DC | PRN

## 2021-06-09 MED ORDER — BISOPROLOL FUMARATE 5 MG PO TABS
5.0000 mg | ORAL_TABLET | Freq: Every day | ORAL | Status: DC
  Administered 2021-06-10 – 2021-06-11 (×2): 5 mg via ORAL
  Filled 2021-06-09 (×2): qty 1

## 2021-06-09 MED ORDER — TRAZODONE HCL 50 MG PO TABS
50.0000 mg | ORAL_TABLET | Freq: Every day | ORAL | Status: DC
  Administered 2021-06-09 – 2021-06-10 (×2): 50 mg via ORAL
  Filled 2021-06-09 (×2): qty 1

## 2021-06-09 MED ORDER — DOCUSATE SODIUM 100 MG PO CAPS
100.0000 mg | ORAL_CAPSULE | Freq: Two times a day (BID) | ORAL | Status: DC
  Administered 2021-06-09 – 2021-06-11 (×3): 100 mg via ORAL
  Filled 2021-06-09 (×4): qty 1

## 2021-06-09 MED ORDER — OXYCODONE-ACETAMINOPHEN 5-325 MG PO TABS
1.0000 | ORAL_TABLET | Freq: Once | ORAL | Status: AC | PRN
Start: 2021-06-09 — End: 2021-06-09
  Administered 2021-06-09: 1 via ORAL
  Filled 2021-06-09: qty 1

## 2021-06-09 MED ORDER — SPIRONOLACTONE 25 MG PO TABS
25.0000 mg | ORAL_TABLET | Freq: Every day | ORAL | Status: DC
  Administered 2021-06-09 – 2021-06-11 (×3): 25 mg via ORAL
  Filled 2021-06-09 (×3): qty 1

## 2021-06-09 MED ORDER — BUDESON-GLYCOPYRROL-FORMOTEROL 160-9-4.8 MCG/ACT IN AERO: 2.0000 | INHALATION_SPRAY | Freq: Two times a day (BID) | RESPIRATORY_TRACT | Status: DC

## 2021-06-09 MED ORDER — OXYCODONE-ACETAMINOPHEN 5-325 MG PO TABS
1.0000 | ORAL_TABLET | Freq: Two times a day (BID) | ORAL | Status: DC
  Administered 2021-06-09 – 2021-06-11 (×5): 1 via ORAL
  Filled 2021-06-09 (×5): qty 1

## 2021-06-09 MED ORDER — DICYCLOMINE HCL 10 MG PO CAPS: 10.0000 mg | ORAL_CAPSULE | Freq: Four times a day (QID) | ORAL | Status: DC | PRN

## 2021-06-09 MED ORDER — TRAMADOL HCL 50 MG PO TABS
100.0000 mg | ORAL_TABLET | Freq: Two times a day (BID) | ORAL | Status: DC
  Administered 2021-06-10 – 2021-06-11 (×3): 100 mg via ORAL
  Filled 2021-06-09 (×3): qty 2

## 2021-06-09 MED ORDER — PANTOPRAZOLE SODIUM 40 MG PO TBEC
40.0000 mg | DELAYED_RELEASE_TABLET | Freq: Every day | ORAL | Status: DC
  Administered 2021-06-09 – 2021-06-11 (×3): 40 mg via ORAL
  Filled 2021-06-09 (×3): qty 1

## 2021-06-09 MED ORDER — EMPAGLIFLOZIN 10 MG PO TABS: 10.0000 mg | ORAL_TABLET | Freq: Every day | ORAL | Status: DC

## 2021-06-09 MED ORDER — ALBUTEROL SULFATE (2.5 MG/3ML) 0.083% IN NEBU: 2.5000 mg | INHALATION_SOLUTION | Freq: Four times a day (QID) | RESPIRATORY_TRACT | Status: DC | PRN

## 2021-06-09 MED ORDER — POLYETHYLENE GLYCOL 3350 17 G PO PACK: 17.0000 g | PACK | Freq: Every day | ORAL | Status: DC | PRN

## 2021-06-09 MED ORDER — HYDRALAZINE HCL 20 MG/ML IJ SOLN: 5.0000 mg | INTRAMUSCULAR | Status: DC | PRN

## 2021-06-09 MED ORDER — UMECLIDINIUM BROMIDE 62.5 MCG/ACT IN AEPB
1.0000 | INHALATION_SPRAY | Freq: Every day | RESPIRATORY_TRACT | Status: DC
  Administered 2021-06-10 – 2021-06-11 (×2): 1 via RESPIRATORY_TRACT
  Filled 2021-06-09: qty 7

## 2021-06-09 MED ORDER — FLUTICASONE FUROATE-VILANTEROL 100-25 MCG/ACT IN AEPB
1.0000 | INHALATION_SPRAY | Freq: Every day | RESPIRATORY_TRACT | Status: DC
  Administered 2021-06-10 – 2021-06-11 (×2): 1 via RESPIRATORY_TRACT
  Filled 2021-06-09: qty 28

## 2021-06-09 MED ORDER — TORSEMIDE 20 MG PO TABS
30.0000 mg | ORAL_TABLET | ORAL | Status: DC
  Administered 2021-06-11: 30 mg via ORAL
  Filled 2021-06-09: qty 1

## 2021-06-09 MED ORDER — AMLODIPINE BESYLATE 5 MG PO TABS
5.0000 mg | ORAL_TABLET | Freq: Every day | ORAL | Status: DC
  Administered 2021-06-09 – 2021-06-11 (×3): 5 mg via ORAL
  Filled 2021-06-09 (×3): qty 1

## 2021-06-09 MED ORDER — LEVOTHYROXINE SODIUM 25 MCG PO TABS
175.0000 ug | ORAL_TABLET | Freq: Every day | ORAL | Status: DC
  Administered 2021-06-09: 175 ug via ORAL
  Filled 2021-06-09: qty 3

## 2021-06-09 MED ORDER — PREDNISONE 5 MG PO TABS
2.5000 mg | ORAL_TABLET | Freq: Every day | ORAL | Status: DC
  Administered 2021-06-10 – 2021-06-11 (×2): 2.5 mg via ORAL
  Filled 2021-06-09 (×2): qty 1

## 2021-06-09 MED ORDER — GABAPENTIN 300 MG PO CAPS
600.0000 mg | ORAL_CAPSULE | Freq: Every morning | ORAL | Status: DC
  Administered 2021-06-10 – 2021-06-11 (×2): 600 mg via ORAL
  Filled 2021-06-09 (×2): qty 2

## 2021-06-09 MED ORDER — ROSUVASTATIN CALCIUM 5 MG PO TABS
10.0000 mg | ORAL_TABLET | Freq: Every day | ORAL | Status: DC
  Administered 2021-06-09 – 2021-06-10 (×2): 10 mg via ORAL
  Filled 2021-06-09 (×2): qty 2

## 2021-06-09 MED ORDER — ONDANSETRON HCL 4 MG PO TABS: 4.0000 mg | ORAL_TABLET | Freq: Four times a day (QID) | ORAL | Status: DC | PRN

## 2021-06-09 MED ORDER — ROPINIROLE HCL 1 MG PO TABS
1.0000 mg | ORAL_TABLET | Freq: Two times a day (BID) | ORAL | Status: DC
  Administered 2021-06-09 – 2021-06-11 (×4): 1 mg via ORAL
  Filled 2021-06-09 (×5): qty 1

## 2021-06-09 MED ORDER — GABAPENTIN 300 MG PO CAPS: 600.0000 mg | ORAL_CAPSULE | ORAL | Status: DC

## 2021-06-09 MED ORDER — APIXABAN 2.5 MG PO TABS: 5.0000 mg | ORAL_TABLET | Freq: Two times a day (BID) | ORAL | Status: DC

## 2021-06-09 MED ORDER — MONTELUKAST SODIUM 10 MG PO TABS
10.0000 mg | ORAL_TABLET | Freq: Every day | ORAL | Status: DC
  Administered 2021-06-09 – 2021-06-10 (×2): 10 mg via ORAL
  Filled 2021-06-09 (×2): qty 1

## 2021-06-09 NOTE — Progress Notes (Signed)
Pt admitted to [facility], VS wnL and as per flow. Paced on telemetry. Pt oriented to 6E processes. Pt familiar with the [facility] system. All questions and concerns addressed. No c/o pain at this time. Call bell placed within reach, will continue to monitor and maintain safety.

## 2021-06-09 NOTE — Assessment & Plan Note (Addendum)
-  Continue bisoprolol, Norvasc -Continue to monitor blood pressures per protocol -Last pressure reading was 131/77

## 2021-06-09 NOTE — Assessment & Plan Note (Addendum)
-  Hold Jardiance while hospitalized and resume in outpatient setting -Cover with moderate-scale SSI -Check HbA1c and was 6.6 -CBGs ranging from 104-148

## 2021-06-09 NOTE — Assessment & Plan Note (Addendum)
-  Wife reports chronic n/v and abdominal pain with prior extensive GI evaluation -May be related to cannabinoid hyperemesis syndrome, as he is continuing to use -Imaging survey indicates cholelithiasis, but there is no evidence of cholecystitis -Overall, his labs and imaging appear benign at this time -By the time I saw him, he was no longer vomiting and asking for [employer] -He was observed and he had no further nausea vomiting and we encouraged cessation of marijuana and following up with a good bowel regimen

## 2021-06-09 NOTE — Assessment & Plan Note (Addendum)
-  Pacemaker and he has a persistent atrial fibrillation with complete heart block status post V paced rhythm pacer -Continue bisoprolol for rate control -Continue Eliquis for anticoagulation -Sees Dr. Rayann Heman in the outpatient setting but will need a new EP physician given that Dr. Rayann Heman is no longer consistently practicing and he will follow-up with MD tolerate the PA.

## 2021-06-09 NOTE — ED Notes (Signed)
Report given to David RN with [facility]  

## 2021-06-09 NOTE — Assessment & Plan Note (Addendum)
-  No current evidence of decompensation -Will follow -Continue demadex and aldactone -Renal function continues to worsen slightly -BNP was 322.7 -Strict I's and O's and daily weights.  Patient is +550 mL since admission -Currently appears euvolemic -Continue to monitor and trend for signs and symptoms of volume overload

## 2021-06-09 NOTE — H&P (Signed)
History and Physical    Patient: [name] MGQ:[id number] DOB: 16-Feb-1951 DOA: 06/08/2021 DOS: the patient was seen and examined on 06/09/2021 PCP: Velna Hatchet, MD  Patient coming from: Home - lives with wife, Alyse Low; Coronita: Wife, [phone]   Chief Complaint: chest pain  HPI: [name] is a 71 y.o. male with medical history significant of afib on Eliquis; stage 3b CKD; chronic diastolic CHF; DM; COPD; HTN; HLD; hypothyroidism; and OSA not on CPAP presenting with chest pain.   He doesn't remember the last few days.  He denies hurting now.  The first day it was his stomach and leg and a bit of CP.  Yesterday, he had a lot of CP and he was given NTG a couple of times.  He is complaining of severe pain in his left leg - he has chronic L leg pain but not usually this severe.  The first doctor thought it was related to his umbilical hernia.  He vomited on Wednesday x 1, none since.  No change in cough.  Yesterday he was more SOB than usual.  He has been unable to eat for several days.  Today, he was confused about why he was here.  His wife gave him O2 yesterday, hasn't been wearing it regularly.  He desats with any activity.  His chest pain is gone.  He does have chronic n/v and smokes marijuana regularly, but thinks this is unrelated.  He hasn't been able to eat/drink much this week due to n/v.      ER Course:  [facility] to [facility] transfer, per Dr. Myna Hidalgo:  Accepted from [facility] for chest pain. He is a 71 yr old with extensive PMHx including COPD on 2 Lpm, HFpEF, a fib on Eliquis, CHB with pacer, CKD IIIb, presenting with abdominal pain and chest pain. He was seen in ED twice on 2/9 for this and had no acute findings on CT abd/pelvis or RUQ [place], now has troponin 44 then 44, BNP 323, no acute findings on CXR, paced rhythm on EKG, and no change in symptoms with NTG. Dr. Florina Ou suspects his COPD may be contributing and is given him nebs.      Review of Systems: As mentioned in the history of present  illness. All other systems reviewed and are negative.  Limited by AMS. Past Medical History:  Diagnosis Date   Aortic stenosis, mild    Arthritis of hand    "just a little bit in both hands" (03/31/2012)   Asthma    "little bit" (03/31/2012)   Atrial fibrillation ([place])    dx '04; DCCV '04, placed on flecainide, failed DCCV 04/2010, flecainide stopped; s/p successful A.fib ablation 01/31/12   CHF (congestive heart failure) ([place])    Controlled type 2 diabetes mellitus without complication, without long-term current use of insulin ([place]) 12/13/2019   COPD (chronic obstructive pulmonary disease) (HCC)    DJD (degenerative joint disease)    Fibromyalgia    GERD (gastroesophageal reflux disease)    Hyperlipidemia    Hypertension    Hypothyroidism    S/P radiation   Left atrial enlargement    LA size 78mm by echo 11/21/11   Mitral regurgitation    trivial   Obstructive sleep apnea    mild by sleep study 2013; pt stated he does not have a machine because "it wasn't bad enough for him to have one"   Pacemaker 03/31/2012   Restless leg syndrome    Second degree AV block  Splenomegaly    Visit for monitoring Tikosyn therapy 04/16/2016   Past Surgical History:  Procedure Laterality Date   AORTIC VALVE REPLACEMENT N/A 08/22/2015   Procedure: AORTIC VALVE REPLACEMENT (AVR);  Surgeon: Ivin Poot, MD;  Location: [place];  Service: Open Heart Surgery;  Laterality: N/A;   ATRIAL FIBRILLATION ABLATION  01/30/2012   PVI by Dr. Rayann Heman   ATRIAL FIBRILLATION ABLATION N/A 01/31/2012   Procedure: ATRIAL FIBRILLATION ABLATION;  Surgeon: Thompson Grayer, MD;  Location: [facility] CATH LAB;  Service: Cardiovascular;  Laterality: N/A;   ATRIAL FIBRILLATION ABLATION N/A 04/19/2020   Procedure: ATRIAL FIBRILLATION ABLATION;  Surgeon: Thompson Grayer, MD;  Location: [place] CV LAB;  Service: Cardiovascular;  Laterality: N/A;   BACK SURGERY     X 3   CARDIAC CATHETERIZATION N/A 08/09/2015   Procedure: Right/Left  Heart Cath and Coronary Angiography;  Surgeon: Peter M Martinique, MD;  Location: [place] CV LAB;  Service: Cardiovascular;  Laterality: N/A;   CARDIAC CATHETERIZATION N/A 02/05/2016   Procedure: Right/Left Heart Cath and Coronary Angiography;  Surgeon: Jettie Booze, MD;  Location: [place] CV LAB;  Service: Cardiovascular;  Laterality: N/A;   CARDIAC CATHETERIZATION N/A 04/17/2016   Procedure: Right Heart Cath;  Surgeon: Jolaine Artist, MD;  Location: [place] CV LAB;  Service: Cardiovascular;  Laterality: N/A;   CARDIOVASCULAR STRESS TEST  03/12/2002   EF 48%, NO EVIDENCE OF ISCHEMIA   CARDIOVERSION  01/2012; 03/31/2012   CARDIOVERSION N/A 02/25/2012   Procedure: CARDIOVERSION;  Surgeon: Thompson Grayer, MD;  Location: [facility] CATH LAB;  Service: Cardiovascular;  Laterality: N/A;   CARDIOVERSION N/A 03/31/2012   Procedure: CARDIOVERSION;  Surgeon: Thompson Grayer, MD;  Location: [facility] CATH LAB;  Service: Cardiovascular;  Laterality: N/A;   CARDIOVERSION N/A 11/23/2015   Procedure: CARDIOVERSION;  Surgeon: Larey Dresser, MD;  Location: [place];  Service: Cardiovascular;  Laterality: N/A;   CARDIOVERSION N/A 04/08/2016   Procedure: CARDIOVERSION;  Surgeon: Jolaine Artist, MD;  Location: [place];  Service: Cardiovascular;  Laterality: N/A;   CARDIOVERSION N/A 09/14/2018   Procedure: CARDIOVERSION;  Surgeon: Josue Hector, MD;  Location: [place];  Service: Cardiovascular;  Laterality: N/A;   CARDIOVERSION N/A 10/19/2019   Procedure: CARDIOVERSION;  Surgeon: Jolaine Artist, MD;  Location: [facility] ENDOSCOPY;  Service: Cardiovascular;  Laterality: N/A;   CARDIOVERSION N/A 02/25/2020   Procedure: CARDIOVERSION;  Surgeon: Fay Records, MD;  Location: [place];  Service: Cardiovascular;  Laterality: N/A;   CARDIOVERSION N/A 05/25/2020   Procedure: CARDIOVERSION;  Surgeon: Lelon Perla, MD;  Location: [facility] ENDOSCOPY;  Service: Cardiovascular;  Laterality: N/A;    CARDIOVERSION N/A 07/10/2020   Procedure: CARDIOVERSION;  Surgeon: Jerline Pain, MD;  Location: [place];  Service: Cardiovascular;  Laterality: N/A;   CLIPPING OF ATRIAL APPENDAGE N/A 08/22/2015   Procedure: CLIPPING OF ATRIAL APPENDAGE;  Surgeon: Ivin Poot, MD;  Location: [place];  Service: Open Heart Surgery;  Laterality: N/A;   DOPPLER ECHOCARDIOGRAPHY  03/11/2002   EF 70-75%   FINGER SURGERY Left    Middle finger   FINGER TENDON REPAIR  1980's   "right little finger" (03/31/2012)   INSERT / REPLACE / REMOVE PACEMAKER     [employer]   IR RADIOLOGIST EVAL & MGMT  05/06/2019   IR RADIOLOGIST EVAL & MGMT  10/06/2019   IR RADIOLOGIST EVAL & MGMT  10/31/2020   IR RADIOLOGIST EVAL & MGMT  01/18/2021   IR RADIOLOGIST EVAL & MGMT  03/08/2021   KIDNEY SURGERY Left    LEFT AND RIGHT HEART CATHETERIZATION WITH CORONARY ANGIOGRAM N/A 10/27/2012   Procedure: LEFT AND RIGHT HEART CATHETERIZATION WITH CORONARY ANGIOGRAM;  Surgeon: Laverda Page, MD;  Location: [facility] CATH LAB;  Service: Cardiovascular;  Laterality: N/A;   LUMBAR DISC SURGERY  1980's X2;  2000's   MAZE N/A 08/22/2015   Procedure: MAZE;  Surgeon: Ivin Poot, MD;  Location: [place];  Service: Open Heart Surgery;  Laterality: N/A;   PACEMAKER INSERTION  03/31/2012   STJ Accent DR pacemaker implanted by Dr Rayann Heman   PERMANENT PACEMAKER INSERTION N/A 03/31/2012   Procedure: PERMANENT PACEMAKER INSERTION;  Surgeon: Thompson Grayer, MD;  Location: [facility] CATH LAB;  Service: Cardiovascular;  Laterality: N/A;   PPM GENERATOR CHANGEOUT N/A 08/08/2020   Procedure: PPM GENERATOR CHANGEOUT;  Surgeon: Thompson Grayer, MD;  Location: [place] CV LAB;  Service: Cardiovascular;  Laterality: N/A;   RADIOLOGY WITH ANESTHESIA Left 02/14/2021   Procedure: RADIOLOGY WITH ANESTHESIA LEFT RENAL CRYOABLATION;  Surgeon: Aletta Edouard, MD;  Location: [facility];  Service: Radiology;  Laterality: Left;   TEE WITHOUT CARDIOVERSION  01/30/2012   Procedure:  TRANSESOPHAGEAL ECHOCARDIOGRAM (TEE);  Surgeon: Larey Dresser, MD;  Location: [place];  Service: Cardiovascular;  Laterality: N/A;  ablation next day   TEE WITHOUT CARDIOVERSION N/A 08/22/2015   Procedure: TRANSESOPHAGEAL ECHOCARDIOGRAM (TEE);  Surgeon: Ivin Poot, MD;  Location: [place];  Service: Open Heart Surgery;  Laterality: N/A;   TEE WITHOUT CARDIOVERSION N/A 10/19/2019   Procedure: TRANSESOPHAGEAL ECHOCARDIOGRAM (TEE);  Surgeon: Jolaine Artist, MD;  Location: [facility] ENDOSCOPY;  Service: Cardiovascular;  Laterality: N/A;   US ECHOCARDIOGRAPHY  08/07/2009   EF 55-60%   Social History:  reports that he quit smoking about 29 years ago. His smoking use included cigarettes. He started smoking about 60 years ago. He has a 25.00 pack-year smoking history. He has never used smokeless tobacco. He reports that he does not currently use alcohol. He reports current drug use. Drug: Marijuana.  Allergies  Allergen Reactions   Meloxicam Rash and Other (See Comments)    Red Man Syndrome, also   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 syndrome 09/02/15, resolved with diphenhydramine and slowing of rate    Family History  Problem Relation Age of Onset   Heart failure Mother    Stroke Father     Prior to Admission medications   Medication Sig Start Date End Date Taking? Authorizing Provider  albuterol (PROVENTIL HFA;VENTOLIN HFA) 108 (90 Base) MCG/ACT inhaler Inhale 2 puffs into the lungs every 6 (six) hours as needed for wheezing or shortness of breath. 12/28/15   Collene Gobble, MD  amLODipine (NORVASC) 5 MG tablet Take 5 mg by mouth daily.    [provider]  apixaban (ELIQUIS) 5 MG TABS tablet Take 1 tablet (5 mg total) by mouth 2 (two) times daily. 05/11/21   Allred, Jeneen Rinks, MD  bisoprolol (ZEBETA) 5 MG tablet Take 1 tablet (5 mg total) by mouth daily. Needs appt 12/25/20   Bensimhon, Shaune Pascal, MD  Budeson-Glycopyrrol-Formoterol (BREZTRI AEROSPHERE) 160-9-4.8 MCG/ACT AERO  Inhale 2 puffs into the lungs in the morning and at bedtime. 11/30/20   Martyn Ehrich, NP  Calcium-Magnesium-Zinc (CAL-MAG-ZINC PO) Take 1 tablet by mouth daily. Vit D    [provider]  cholecalciferol (VITAMIN D3) 25 MCG (1000 UNIT) tablet Take 4,000 Units by mouth at bedtime.    [provider]  dicyclomine (  BENTYL) 10 MG capsule Take 1 capsule (10 mg total) by mouth every 6 (six) hours as needed (For abdominal cramping). 06/08/21   Molpus, John, MD  empagliflozin (JARDIANCE) 10 MG TABS tablet Take 1 tablet (10 mg total) by mouth daily before breakfast. Patient taking differently: Take 10 mg by mouth daily. 03/14/20   Bensimhon, Shaune Pascal, MD  fexofenadine (ALLEGRA) 180 MG tablet Take 1 tablet (180 mg total) by mouth daily as needed for allergies or rhinitis. 01/23/20   Aline August, MD  gabapentin (NEURONTIN) 300 MG capsule Take 600-1,200 mg by mouth See admin instructions. Take 600 mg by mouth in the morning, 600 mg at lunchtime, and take 1200 mg at bedtime 09/13/14   [provider]  levothyroxine (SYNTHROID, LEVOTHROID) 175 MCG tablet Take 175 mcg by mouth daily before breakfast.    [provider]  LORazepam (ATIVAN) 1 MG tablet Take 1 tablet (1 mg total) by mouth every 8 (eight) hours as needed (nausea and/or restlessness). Patient taking differently: Take 1 mg by mouth daily. 08/09/20   Carmin Muskrat, MD  Menthol-Camphor (TIGER BALM ARTHRITIS RUB EX) Apply 1 application topically continuous as needed (pain).    [provider]  montelukast (SINGULAIR) 10 MG tablet Take 1 tablet (10 mg total) by mouth at bedtime. 12/11/20   Parrett, Fonnie Mu, NP  Multiple Vitamin (MULTIVITAMIN WITH MINERALS) TABS tablet Take 1 tablet by mouth daily. 12/15/19   Raiford Noble Latif, DO  mupirocin ointment (BACTROBAN) 2 % Apply 1 application topically daily as needed (infection animal Scratch). 12/25/20   [provider]  ondansetron (ZOFRAN-ODT) 4 MG  disintegrating tablet Take 4 mg by mouth every 8 (eight) hours as needed for nausea or vomiting.    [provider]  ondansetron (ZOFRAN-ODT) 4 MG disintegrating tablet Take 1 tablet (4 mg total) by mouth every 8 (eight) hours as needed for nausea or vomiting. 06/07/21   Fredia Sorrow, MD  oxyCODONE-acetaminophen (PERCOCET/ROXICET) 5-325 MG tablet Take 1 tablet by mouth every 12 (twelve) hours.    [provider]  OXYGEN Inhale 2 L into the lungs at bedtime.    [provider]  pantoprazole (PROTONIX) 40 MG tablet Take 40 mg by mouth daily.    [provider]  polyethylene glycol powder (GLYCOLAX/MIRALAX) 17 GM/SCOOP powder Take 17 g by mouth daily as needed for mild constipation. 01/23/20   Aline August, MD  Polyvinyl Alcohol-Povidone (REFRESH OP) Place 1 drop into both eyes daily as needed (dry eyes).    [provider]  potassium chloride SA (KLOR-CON) 20 MEQ tablet Take 30 mEq by mouth 2 (two) times daily. Every other day    [provider]  predniSONE (DELTASONE) 5 MG tablet Take 1 tablet (5 mg total) by mouth daily with breakfast. Patient taking differently: Take 2.5 mg by mouth daily with breakfast. 07/19/20   Martyn Ehrich, NP  promethazine (PHENERGAN) 25 MG tablet Take 1 tablet (25 mg total) by mouth every 6 (six) hours as needed for nausea or vomiting. 12/10/20   Drenda Freeze, MD  rOPINIRole (REQUIP) 1 MG tablet Take 1 tablet (1 mg total) by mouth in the morning and at bedtime. 01/23/20   Aline August, MD  rosuvastatin (CRESTOR) 10 MG tablet Take 10 mg by mouth at bedtime.  08/08/16   [provider]  spironolactone (ALDACTONE) 25 MG tablet Take 1 tablet (25 mg total) by mouth daily. 02/16/16   Shirley Friar, PA-C  torsemide (DEMADEX) 20 MG tablet  Take 30 mg by mouth every other day.    [provider]  traMADol (ULTRAM) 50 MG tablet Take 100 mg by mouth 2 (two) times daily. 10/26/20   [provider]  traZODone (DESYREL) 50 MG tablet Take 50 mg by mouth at bedtime. 08/30/20   [provider]    Physical Exam: Vitals:   06/09/21 1300 06/09/21 1400 06/09/21 1506 06/09/21 1652  BP: 138/69 117/60 (!) 148/77   Pulse:   60   Resp: 12 13 15    Temp:   (!) 97.5 F (36.4 C)   TempSrc:   Oral   SpO2:  98% 99% 100%  Weight:      Height:       General:  Appears calm and comfortable and is in NAD, mildly confused Eyes:   EOMI, normal lids, iris ENT:  grossly normal hearing, lips & tongue, mmm; mostly absent dentition Neck:  no LAD, masses or thyromegaly Cardiovascular:  RRR, no m/r/g. No LE edema.  Respiratory:   CTA bilaterally with no wheezes/rales/rhonchi.  Normal respiratory effort. Abdomen:  soft, NT, ND Skin:  no rash or induration seen on limited exam Musculoskeletal:  grossly normal tone BUE/BLE, good ROM, no bony abnormality Psychiatric:  eccentric mood and affect, speech fluent and appropriate, AOx2 Neurologic:  CN 2-12 grossly intact, moves all extremities in coordinated fashion   Radiological Exams on Admission: Independently reviewed - see discussion in A/P where applicable  CT HEAD WO CONTRAST (5MM)  Result Date: 06/09/2021 CLINICAL DATA:  Delirium. EXAM: CT HEAD WITHOUT CONTRAST TECHNIQUE: Contiguous axial images were obtained from the base of the skull through the vertex without intravenous contrast. RADIATION DOSE REDUCTION: This exam was performed according to the departmental dose-optimization program which includes automated exposure control, adjustment of the mA and/or kV according to patient size and/or use of iterative reconstruction technique. COMPARISON:  09/03/2015 FINDINGS: Brain: No evidence of acute infarction, hemorrhage, hydrocephalus, extra-axial collection or mass lesion/mass effect. There is mild diffuse low-attenuation within the subcortical and periventricular white matter compatible with chronic microvascular disease. Vascular: No  hyperdense vessel or unexpected calcification. Skull: Normal. Negative for fracture or focal lesion. Sinuses/Orbits: Retention cyst or polyp is identified within the right maxillary sinus measuring 1 point 4 cm, image 81/4. Previously this measured 1.1 cm. Other: None. IMPRESSION: 1. No acute intracranial abnormalities. 2. Chronic small vessel ischemic disease. 3. Right maxillary sinus retention cyst or polyp. Electronically Signed   By: Kerby Moors M.D.   On: 06/09/2021 18:24   DG Chest Portable 1 View  Result Date: 06/08/2021 CLINICAL DATA:  Shortness of breath EXAM: PORTABLE CHEST 1 VIEW COMPARISON:  06/07/2021, 12/08/2020 FINDINGS: Post sternotomy changes. Left-sided pacing device and valve prosthesis as before. Atrial appendage clip. Borderline to mild cardiomegaly. No overt pulmonary edema. IMPRESSION: No active disease. Electronically Signed   By: Donavan Foil M.D.   On: 06/08/2021 23:34   US Abdomen Limited RUQ (LIVER/GB)  Result Date: 06/08/2021 CLINICAL DATA:  Right upper quadrant abdominal pain EXAM: ULTRASOUND ABDOMEN LIMITED RIGHT UPPER QUADRANT COMPARISON:  CT abdomen/pelvis dated 06/07/2021 FINDINGS: Gallbladder: 6 mm gallstone within the gallbladder neck. No gallbladder wall thickening or pericholecystic fluid. Negative sonographic Murphy's sign. Common bile duct: Diameter: 4 mm Liver: No focal lesion identified. Within normal limits in parenchymal echogenicity. Portal vein is patent on color Doppler imaging with normal direction of blood flow towards the liver. Other: Right renal atrophy with increased parenchymal echogenicity. IMPRESSION: Cholelithiasis, without associated sonographic findings to suggest  acute cholecystitis. Electronically Signed   By: Julian Hy M.D.   On: 06/08/2021 00:52    EKG: Independently reviewed.  Ventricular-paced rhythm with rate 60; NSCSLT   Labs on Admission: I have personally reviewed the available labs and imaging studies at the time of the  admission.  Pertinent labs:    Glucose 115 BUN 29/Creatinine 1.89/GFR 37 - stable BNP 322.7 HS troponin 44, 44 WBC 10.8 UA: >500 glucose, 15 ketones, 100 protein COVID/flu negative on 2/9 ABG: 7.516/22.8/103/18.3    Assessment and Plan: * Chest pain- (present on admission) -Patient presented to [place] with c/o chest pain -Had multiple visits leading up to this presentation -Troponin mildly elevated but negative delta -Unremarkable EKG -Chest pain has resolved and patient has no current memory of this but does acknowledge n/v and abdominal pain -No further current evaluation is planned other than overnight telemetry monitoring  DNR (do not resuscitate)- (present on admission) -I have discussed code status with the patient and his wife and they are in agreement that the patient would not desire resuscitation and would prefer to die a natural death should that situation arise. -He will need a gold out of facility DNR form at the time of discharge  Marijuana dependence ([place])- (present on admission) -Cessation encouraged; this should be encouraged on an ongoing basis -UDS ordered  Acute metabolic encephalopathy- (present on admission) -Upon arrival at [facility] he was found to be confused -Oriented to person and place but not time -He had removed his O2 and was in the 70s but hypoxic encephalopathy is a consideration -Negative head CT -Unremarkable ABG -Will follow but encourage ongoing use of O2  Abdominal pain- (present on admission) -Wife reports chronic n/v and abdominal pain with prior extensive GI evaluation -May be related to cannabinoid hyperemesis syndrome, as he is continuing to use -Imaging today indicates cholelithiasis, but there is no evidence of cholecystitis -Overall, his labs and imaging appear benign at this time -By the time I saw him, he was no longer vomiting and asking for [employer] -Will observe overnight -Anticipate d/c to home tomorrow -Encourage  marijuana cessation and a good bowel regimen as well as behavioral health support  Chronic kidney disease, stage 3b ([place])- (present on admission) -Appears to be stable at this time -Will trend  Controlled type 2 diabetes mellitus with stage 3 chronic kidney disease, without long-term current use of insulin ([place])- (present on admission) -hold Jardiance -Cover with moderate-scale SSI  Hypothyroidism- (present on admission) -Continue Synthroid  COPD mixed type ([place])- (present on admission) -No current evidence of exacerbation, but he clearly needs [place] O2 and rapidly desaturates without it -Continue Singulair, Breztri -He takes prednisone 2.5 mg daily, will continue for now  Typical atrial flutter ([place])- (present on admission) -Pacemaker -Continue bisoprolol for rate control -Continue Eliquis  Chronic diastolic CHF (congestive heart failure) ([place])- (present on admission) -No current evidence of decompensation -Will follow -Continue demadex and aldactone  Hypertension- (present on admission) -Continue bisoprolol, Norvasc     Advance Care Planning:   Code Status: DNR   Consults: None  DVT Prophylaxis: Eliquis  Family Communication: Wife was present throughout evaluation  Severity of Illness: The appropriate patient status for this patient is OBSERVATION. Observation status is judged to be reasonable and necessary in order to provide the required intensity of service to ensure the patient's safety. The patient's presenting symptoms, physical exam findings, and initial radiographic and laboratory data in the context of their medical condition is felt to place them at  decreased risk for further clinical deterioration. Furthermore, it is anticipated that the patient will be medically stable for discharge from the hospital within 2 midnights of admission.   Author: Karmen Bongo, MD 06/09/2021 6:46 PM  For on call review [email or web address].

## 2021-06-09 NOTE — Assessment & Plan Note (Addendum)
-  Patient presented to [place] with c/o chest pain -Had multiple visits leading up to this presentation -Troponin mildly elevated but negative delta as it was 44 and repeat was 44 -Unremarkable EKG -Chest pain has resolved and patient has no current memory of this but does acknowledge n/v and abdominal pain -Echocardiogram obtained and showed ejection fraction of 55 to 60% with normal left ventricular function and no wall motion abnormalities.  The right ventricular systolic function was normal and the left ventricular diastolic parameters were indeterminate.  The mitral valve was normal in structure there is mild dilatation of the ascending aorta and compared to the echo in 12/13/2019 AVG has increased from 8 to 11 mmHg -Cardiology evaluated and felt that his chest pain was atypical and recommended no further ischemic or inpatient work-up and recommended discharging home -Patient is medically stable to be discharged at this time and follow-up with PCP and EP cardiology outpatient

## 2021-06-09 NOTE — Assessment & Plan Note (Addendum)
-  Dr. Lorin Mercy have discussed code status with the patient and his wife and they are in agreement that the patient would not desire resuscitation and would prefer to die a natural death should that situation arise. -He will need a gold out of facility DNR form at the time of discharge

## 2021-06-09 NOTE — ED Notes (Signed)
[facility] at bedside 

## 2021-06-09 NOTE — Assessment & Plan Note (Addendum)
-  Cessation encouraged; this should be encouraged on an ongoing basis -UDS ordered and still not done yet but he is stable to be discharged

## 2021-06-09 NOTE — Assessment & Plan Note (Addendum)
-  His BUN/creatinine is slightly trending upwards and went from 26/1.85 -> 29/1.89 -> 39/2.06 and is now further improved to 30/1.58 -Avoid further nephrotoxic medications, contrast dyes, hypotension and renally adjust medications -Resume his home diuretics -Repeat CMP within 1 week

## 2021-06-09 NOTE — Assessment & Plan Note (Addendum)
-  Upon arrival at [facility] he was found to be confused but now appears to be appropriate -Oriented to person and place and time now -He had removed his O2 and was in the 70s but hypoxic encephalopathy is a consideration -Negative head CT -Unremarkable ABG    Component Value Date/Time   PHART 7.516 (H) 06/09/2021 1647   PCO2ART 22.8 (L) 06/09/2021 1647   PO2ART 103 06/09/2021 1647   HCO3 18.3 (L) 06/09/2021 1647   TCO2 31 07/10/2020 0920   ACIDBASEDEF 4.2 (H) 06/09/2021 1647   O2SAT 98.5 06/09/2021 1647  -Will follow but encourage ongoing use of O2 -Patient appears to be has baseline. -We will obtain PT and OT to further evaluate and treat recommended no follow-up -Patient appears to be at his baseline much improved and is awake and alert and oriented x3

## 2021-06-09 NOTE — ED Notes (Signed)
Pt does not feel that he needs a breathing treatment at present time

## 2021-06-09 NOTE — Assessment & Plan Note (Addendum)
-  Continue Synthroid for now but may need to adjust and cut back on the dose -TSH was 0.160 and will need repeat in 4 to 6 weeks with T4 and T3 evaluation

## 2021-06-09 NOTE — Assessment & Plan Note (Addendum)
-  No current evidence of exacerbation, but he clearly needs [place] O2 and rapidly desaturates without it -SpO2: 98 % O2 Flow Rate (L/min): 2 L/min FiO2 (%): 100 % -Continue Singulair, Breztri substitutions while hospitalized -He takes prednisone 2.5 mg daily, will continue for now

## 2021-06-10 ENCOUNTER — Observation Stay (HOSPITAL_BASED_OUTPATIENT_CLINIC_OR_DEPARTMENT_OTHER): Payer: Medicare Other

## 2021-06-10 DIAGNOSIS — I5032 Chronic diastolic (congestive) heart failure: Secondary | ICD-10-CM | POA: Diagnosis not present

## 2021-06-10 DIAGNOSIS — R1084 Generalized abdominal pain: Secondary | ICD-10-CM | POA: Diagnosis not present

## 2021-06-10 DIAGNOSIS — R079 Chest pain, unspecified: Secondary | ICD-10-CM

## 2021-06-10 DIAGNOSIS — G9341 Metabolic encephalopathy: Secondary | ICD-10-CM | POA: Diagnosis not present

## 2021-06-10 LAB — ECHOCARDIOGRAM COMPLETE
AR max vel: 1.71 cm2
AV Area VTI: 1.63 cm2
AV Area mean vel: 1.66 cm2
AV Mean grad: 11 mmHg
AV Peak grad: 20.8 mmHg
Ao pk vel: 2.28 m/s
Area-P 1/2: 4.24 cm2
Height: 71 in
S' Lateral: 3.3 cm
Weight: 2800.72 oz

## 2021-06-10 LAB — BASIC METABOLIC PANEL
Anion gap: 14 (ref 5–15)
BUN: 39 mg/dL — ABNORMAL HIGH (ref 8–23)
CO2: 19 mmol/L — ABNORMAL LOW (ref 22–32)
Calcium: 9.3 mg/dL (ref 8.9–10.3)
Chloride: 105 mmol/L (ref 98–111)
Creatinine, Ser: 2.06 mg/dL — ABNORMAL HIGH (ref 0.61–1.24)
GFR, Estimated: 34 mL/min — ABNORMAL LOW (ref >60)
Glucose, Bld: 136 mg/dL — ABNORMAL HIGH (ref 70–99)
Potassium: 3.5 mmol/L (ref 3.5–5.1)
Sodium: 138 mmol/L (ref 135–145)

## 2021-06-10 LAB — HEPATIC FUNCTION PANEL
ALT: 18 U/L (ref 0–44)
AST: 24 U/L (ref 15–41)
Albumin: 3.8 g/dL (ref 3.5–5.0)
Alkaline Phosphatase: 58 U/L (ref 38–126)
Bilirubin, Direct: 0.2 mg/dL (ref 0.0–0.2)
Indirect Bilirubin: 0.6 mg/dL (ref 0.3–0.9)
Total Bilirubin: 0.8 mg/dL (ref 0.3–1.2)
Total Protein: 6.6 g/dL (ref 6.5–8.1)

## 2021-06-10 LAB — CBC
HCT: 43.5 % (ref 39.0–52.0)
Hemoglobin: 14 g/dL (ref 13.0–17.0)
MCH: 28.5 pg (ref 26.0–34.0)
MCHC: 32.2 g/dL (ref 30.0–36.0)
MCV: 88.4 fL (ref 80.0–100.0)
Platelets: 166 10*3/uL (ref 150–400)
RBC: 4.92 MIL/uL (ref 4.22–5.81)
RDW: 15.9 % — ABNORMAL HIGH (ref 11.5–15.5)
WBC: 10.9 10*3/uL — ABNORMAL HIGH (ref 4.0–10.5)
nRBC: 0 % (ref 0.0–0.2)

## 2021-06-10 LAB — GLUCOSE, CAPILLARY: Glucose-Capillary: 148 mg/dL — ABNORMAL HIGH (ref 70–99)

## 2021-06-10 MED ORDER — INSULIN ASPART 100 UNIT/ML IJ SOLN: 0.0000 [IU] | Freq: Three times a day (TID) | INTRAMUSCULAR | Status: DC

## 2021-06-10 MED ORDER — INSULIN ASPART 100 UNIT/ML IJ SOLN: 0.0000 [IU] | Freq: Every day | INTRAMUSCULAR | Status: DC

## 2021-06-10 MED ORDER — IPRATROPIUM-ALBUTEROL 0.5-2.5 (3) MG/3ML IN SOLN
3.0000 mL | Freq: Two times a day (BID) | RESPIRATORY_TRACT | Status: DC
  Administered 2021-06-10 – 2021-06-11 (×2): 3 mL via RESPIRATORY_TRACT
  Filled 2021-06-10 (×2): qty 3

## 2021-06-10 NOTE — Hospital Course (Addendum)
HPI per Dr. Karmen Bongo CRIMSON BEER is a 71 y.o. male with medical history significant of afib on Eliquis; stage 3b CKD; chronic diastolic CHF; DM; COPD; HTN; HLD; hypothyroidism; and OSA not on CPAP presenting with chest pain.   He doesn't remember the last few days.  He denies hurting now.  The first day it was his stomach and leg and a bit of CP.  Yesterday, he had a lot of CP and he was given NTG a couple of times.  He is complaining of severe pain in his left leg - he has chronic L leg pain but not usually this severe.  The first doctor thought it was related to his umbilical hernia.  He vomited on Wednesday x 1, none since.  No change in cough.  Yesterday he was more SOB than usual.  He has been unable to eat for several days.  Today, he was confused about why he was here.  His wife gave him O2 yesterday, hasn't been wearing it regularly.  He desats with any activity.  His chest pain is gone.  He does have chronic n/v and smokes marijuana regularly, but thinks this is unrelated.  He hasn't been able to eat/drink much this week due to n/v.         ER Course:  [facility] to [facility] transfer, per Dr. Myna Hidalgo:   Accepted from [facility] for chest pain. He is a 71 yr old with extensive PMHx including COPD on 2 Lpm, HFpEF, a fib on Eliquis, CHB with pacer, CKD IIIb, presenting with abdominal pain and chest pain. He was seen in ED twice on 2/9 for this and had no acute findings on CT abd/pelvis or RUQ [place], now has troponin 44 then 44, BNP 323, no acute findings on CXR, paced rhythm on EKG, and no change in symptoms with NTG. Dr. Florina Ou suspects his COPD may be contributing and is given him nebs.    **Interim History Patient's chest pain resolved but we have ordered an echocardiogram.  Given his history of chronic diastolic CHF and a history of complete heart block and permanent atrial fibrillation atypical flutter status post maze we will consult cardiology for further evaluation recommendations.  Since chest and  abdominal pain have improved however his creatinine has slightly worsened and we will obtain PT OT to further evaluate and treat and they recommended no follow-up  BUN/creatinine improved and normalized.  Cardiology is consulted for further evaluation of his chest pain in the felt that he was stable from their perspective and recommended discharging home as a plan no inpatient ischemic work-up given that his chest pain was very atypical.  Patient is deemed medically stable to be discharged home at this time and follow-up with PCP and cardiology in outpatient setting.

## 2021-06-10 NOTE — Progress Notes (Signed)
°  Echocardiogram 2D Echocardiogram has been performed.  [name] 06/10/2021, 1:46 PM

## 2021-06-10 NOTE — Progress Notes (Signed)
Progress Note   Patient: [name] DOB: 1951-01-17 DOA: 06/08/2021     0 DOS: the patient was seen and examined on 06/10/2021   Brief hospital course: HPI per Dr. Karmen Bongo ARJAY JASKIEWICZ is a 71 y.o. male with medical history significant of afib on Eliquis; stage 3b CKD; chronic diastolic CHF; DM; COPD; HTN; HLD; hypothyroidism; and OSA not on CPAP presenting with chest pain.   He doesn't remember the last few days.  He denies hurting now.  The first day it was his stomach and leg and a bit of CP.  Yesterday, he had a lot of CP and he was given NTG a couple of times.  He is complaining of severe pain in his left leg - he has chronic L leg pain but not usually this severe.  The first doctor thought it was related to his umbilical hernia.  He vomited on Wednesday x 1, none since.  No change in cough.  Yesterday he was more SOB than usual.  He has been unable to eat for several days.  Today, he was confused about why he was here.  His wife gave him O2 yesterday, hasn't been wearing it regularly.  He desats with any activity.  His chest pain is gone.  He does have chronic n/v and smokes marijuana regularly, but thinks this is unrelated.  He hasn't been able to eat/drink much this week due to n/v.         ER Course:  [facility] to [facility] transfer, per Dr. Myna Hidalgo:   Accepted from [facility] for chest pain. He is a 71 yr old with extensive PMHx including COPD on 2 Lpm, HFpEF, a fib on Eliquis, CHB with pacer, CKD IIIb, presenting with abdominal pain and chest pain. He was seen in ED twice on 2/9 for this and had no acute findings on CT abd/pelvis or RUQ [place], now has troponin 44 then 44, BNP 323, no acute findings on CXR, paced rhythm on EKG, and no change in symptoms with NTG. Dr. Florina Ou suspects his COPD may be contributing and is given him nebs.    **Interim History Patient's chest pain resolved but we have ordered an echocardiogram.  Given his history of chronic diastolic CHF and a history of  complete heart block and permanent atrial fibrillation atypical flutter status post maze we will consult cardiology for further evaluation recommendations.  Since chest and abdominal pain have improved however his creatinine has slightly worsened and we will obtain PT OT to further evaluate and treat  Assessment and Plan: * Chest pain- (present on admission) -Patient presented to [place] with c/o chest pain -Had multiple visits leading up to this presentation -Troponin mildly elevated but negative delta as it was 44 and repeat was 44 -Unremarkable EKG -Chest pain has resolved and patient has no current memory of this but does acknowledge n/v and abdominal pain -Echocardiogram obtained and showed ejection fraction of 55 to 60% with normal left ventricular function and no wall motion abnormalities.  The right ventricular systolic function was normal and the left ventricular diastolic parameters were indeterminate.  The mitral valve was normal in structure there is mild dilatation of the ascending aorta and compared to the echo in 12/13/2019 AVG has increased from 8 to 11 mmHg -We will discuss with cardiology given that he has had multiple chest pain visits  DNR (do not resuscitate)- (present on admission) -Dr. Lorin Mercy have discussed code status with the patient and his wife and they  are in agreement that the patient would not desire resuscitation and would prefer to die a natural death should that situation arise. -He will need a gold out of facility DNR form at the time of discharge  Marijuana dependence ([place])- (present on admission) -Cessation encouraged; this should be encouraged on an ongoing basis -UDS ordered and still not done yet  Acute metabolic encephalopathy- (present on admission) -Upon arrival at [facility] he was found to be confused but now appears to be appropriate -Oriented to person and place and time now -He had removed his O2 and was in the 70s but hypoxic encephalopathy is a  consideration -Negative head CT -Unremarkable ABG    Component Value Date/Time   PHART 7.516 (H) 06/09/2021 1647   PCO2ART 22.8 (L) 06/09/2021 1647   PO2ART 103 06/09/2021 1647   HCO3 18.3 (L) 06/09/2021 1647   TCO2 31 07/10/2020 0920   ACIDBASEDEF 4.2 (H) 06/09/2021 1647   O2SAT 98.5 06/09/2021 1647  -Will follow but encourage ongoing use of O2 -Patient appears to be has baseline. -We will obtain PT and OT to further evaluate and treat  Abdominal pain- (present on admission) -Wife reports chronic n/v and abdominal pain with prior extensive GI evaluation -May be related to cannabinoid hyperemesis syndrome, as he is continuing to use -Imaging survey indicates cholelithiasis, but there is no evidence of cholecystitis -Overall, his labs and imaging appear benign at this time -By the time I saw him, he was no longer vomiting and asking for [employer] -Will observe overnight and continue to monitor -Encourage marijuana cessation and a good bowel regimen as well as behavioral health support  Chronic kidney disease, stage 3b ([place])- (present on admission) -His BUN/creatinine is slightly trending upwards and went from 26/1.85 -> 29/1.89 -> 39/2.06 -Avoid further nephrotoxic medications, consciousness, hypotension and renally adjust medications -Repeat CMP in a.m.  Controlled type 2 diabetes mellitus with stage 3 chronic kidney disease, without long-term current use of insulin ([place])- (present on admission) -Hold Jardiance -Cover with moderate-scale SSI -Check HbA1c in the AM   Hypothyroidism- (present on admission) -Continue Synthroid -Check TSH in the AM  COPD mixed type ([place])- (present on admission) -No current evidence of exacerbation, but he clearly needs [place] O2 and rapidly desaturates without it -SpO2: 98 % O2 Flow Rate (L/min): 2 L/min FiO2 (%): 100 % -Continue Singulair, Breztri -He takes prednisone 2.5 mg daily, will continue for now  Typical atrial flutter ([facility])-  (present on admission) -Pacemaker -Continue bisoprolol for rate control -Continue Eliquis for anticoagulation -Sees Dr. Rayann Heman in the outpatient setting   Chronic diastolic CHF (congestive heart failure) ([place])- (present on admission) -No current evidence of decompensation -Will follow -Continue demadex and aldactone -Renal function continues to worsen slightly -BNP was 322.7 -Strict I's and O's and daily weights.  Patient is +550 mL since admission -Continue to monitor and trend for signs and symptoms of volume overload  Hypertension- (present on admission) -Continue bisoprolol, Norvasc -Continue to monitor blood pressures per protocol -Last pressure reading was 136/68   Subjective: Seen And examined at bedside states that his chest and abdominal pain is improved now.  No nausea or vomiting.  Feels okay.  No other concerns or clots at this time but given his history will obtain echocardiogram and discussed with cardiology given that he has had multiple ED visits for chest pain.  Physical Exam: Vitals:   06/10/21 0605 06/10/21 0805 06/10/21 1145 06/10/21 1956  BP: 127/60 127/69 136/68   Pulse: 60 60 61  Resp: 18 19    Temp: 97.6 F (36.4 C) (!) 97.5 F (36.4 C) 97.9 F (36.6 C)   TempSrc: Oral Oral Oral   SpO2: 99% 100% 97% 98%  Weight:      Height:       Examination: Physical Exam:  Constitutional: WN/WD obese Caucasian male currently in no acute distress appears calm  Respiratory: Diminished to auscultation bilaterally with coarse breath sounds, no wheezing, rales, rhonchi or crackles. Normal respiratory effort and patient is not tachypenic. No accessory muscle use.  Wearing supplemental oxygen via nasal cannula Cardiovascular: RRR, 2 out of 6 murmur, S1 and S2 auscultated.  Has mild 1+ extremity edema Abdomen: Soft, minimally-tender, distended second body habitus. Bowel sounds positive.  GU: Deferred.   Data Reviewed:  I have independently reviewed and interpreted  the patient's CBC and CMP  Labs showing a slightly worsening BUN/creatinine but a flat troponin.  He has a mildly elevated leukocytosis of 10.9 and a slight metabolic acidosis with a CO2 of 19  Family Communication: No family currently at bedside  Disposition: Status is: Observation The patient will require care spanning > 2 midnights and should be moved to inpatient because: We will need PT OT evaluation and cardiology evaluation prior to safe discharge disposition   Planned Discharge Destination:  Determined as patient needs PT OT evaluation  DVT Prophylaxis: Anticoagulated with Apixaban   Author: Raiford Noble, DO [facility] 06/10/2021 8:19 PM  For on call review [email or web address].

## 2021-06-10 NOTE — Care Management Obs Status (Signed)
[place] NOTIFICATION   Patient Details  Name: YAQUB ARNEY MRN: [id number] Date of Birth: Apr 27, 1951   Medicare Observation Status Notification Given:  Yes    Dawayne Patricia, RN 06/10/2021, 3:47 PM

## 2021-06-11 DIAGNOSIS — R1013 Epigastric pain: Secondary | ICD-10-CM | POA: Diagnosis not present

## 2021-06-11 DIAGNOSIS — R079 Chest pain, unspecified: Secondary | ICD-10-CM | POA: Diagnosis not present

## 2021-06-11 DIAGNOSIS — Z952 Presence of prosthetic heart valve: Secondary | ICD-10-CM | POA: Diagnosis not present

## 2021-06-11 DIAGNOSIS — R1084 Generalized abdominal pain: Secondary | ICD-10-CM

## 2021-06-11 DIAGNOSIS — G9341 Metabolic encephalopathy: Secondary | ICD-10-CM | POA: Diagnosis not present

## 2021-06-11 DIAGNOSIS — I5032 Chronic diastolic (congestive) heart failure: Secondary | ICD-10-CM

## 2021-06-11 LAB — CBC WITH DIFFERENTIAL/PLATELET
Abs Immature Granulocytes: 0.05 10*3/uL (ref 0.00–0.07)
Basophils Absolute: 0 10*3/uL (ref 0.0–0.1)
Basophils Relative: 1 %
Eosinophils Absolute: 0.1 10*3/uL (ref 0.0–0.5)
Eosinophils Relative: 2 %
HCT: 43.9 % (ref 39.0–52.0)
Hemoglobin: 14.1 g/dL (ref 13.0–17.0)
Immature Granulocytes: 1 %
Lymphocytes Relative: 16 %
Lymphs Abs: 1.3 10*3/uL (ref 0.7–4.0)
MCH: 28.8 pg (ref 26.0–34.0)
MCHC: 32.1 g/dL (ref 30.0–36.0)
MCV: 89.6 fL (ref 80.0–100.0)
Monocytes Absolute: 0.7 10*3/uL (ref 0.1–1.0)
Monocytes Relative: 9 %
Neutro Abs: 6.1 10*3/uL (ref 1.7–7.7)
Neutrophils Relative %: 71 %
Platelets: 127 10*3/uL — ABNORMAL LOW (ref 150–400)
RBC: 4.9 MIL/uL (ref 4.22–5.81)
RDW: 15.7 % — ABNORMAL HIGH (ref 11.5–15.5)
WBC: 8.3 10*3/uL (ref 4.0–10.5)
nRBC: 0 % (ref 0.0–0.2)

## 2021-06-11 LAB — COMPREHENSIVE METABOLIC PANEL
ALT: 18 U/L (ref 0–44)
AST: 21 U/L (ref 15–41)
Albumin: 3.6 g/dL (ref 3.5–5.0)
Alkaline Phosphatase: 53 U/L (ref 38–126)
Anion gap: 8 (ref 5–15)
BUN: 30 mg/dL — ABNORMAL HIGH (ref 8–23)
CO2: 23 mmol/L (ref 22–32)
Calcium: 8.9 mg/dL (ref 8.9–10.3)
Chloride: 105 mmol/L (ref 98–111)
Creatinine, Ser: 1.58 mg/dL — ABNORMAL HIGH (ref 0.61–1.24)
GFR, Estimated: 46 mL/min — ABNORMAL LOW (ref >60)
Glucose, Bld: 113 mg/dL — ABNORMAL HIGH (ref 70–99)
Potassium: 3.9 mmol/L (ref 3.5–5.1)
Sodium: 136 mmol/L (ref 135–145)
Total Bilirubin: 0.5 mg/dL (ref 0.3–1.2)
Total Protein: 6.1 g/dL — ABNORMAL LOW (ref 6.5–8.1)

## 2021-06-11 LAB — HEMOGLOBIN A1C
Hgb A1c MFr Bld: 6.6 % — ABNORMAL HIGH (ref 4.8–5.6)
Mean Plasma Glucose: 142.72 mg/dL

## 2021-06-11 LAB — TSH: TSH: 0.16 u[IU]/mL — ABNORMAL LOW (ref 0.350–4.500)

## 2021-06-11 LAB — GLUCOSE, CAPILLARY
Glucose-Capillary: 118 mg/dL — ABNORMAL HIGH (ref 70–99)
Glucose-Capillary: 104 mg/dL — ABNORMAL HIGH (ref 70–99)

## 2021-06-11 LAB — MAGNESIUM: Magnesium: 2 mg/dL (ref 1.7–2.4)

## 2021-06-11 LAB — PHOSPHORUS: Phosphorus: 3.1 mg/dL (ref 2.5–4.6)

## 2021-06-11 NOTE — Discharge Summary (Signed)
Physician Discharge Summary   Patient: [name] MRN: [id number] DOB: [birth year]  Admit date:     06/08/2021  Discharge date: 06/11/21  Discharge Physician: Kerney Elbe   PCP: Velna Hatchet, MD   Recommendations at discharge:   Follow-up with Cardiology within 1 to 2 weeks Follow-up with PCP within 1 to 2 weeks and repeat CBC, CMP, mag, Phos Follow-up with Pulmonary within 1 to 2 weeks  Discharge Diagnoses: Principal Problem:   Chest pain Active Problems:   Hypertension   Chronic diastolic CHF (congestive heart failure) (HCC)   Typical atrial flutter (HCC)   COPD mixed type (HCC)   Hypothyroidism   Controlled type 2 diabetes mellitus with stage 3 chronic kidney disease, without long-term current use of insulin (HCC)   Chronic kidney disease, stage 3b (HCC)   Abdominal pain   Acute metabolic encephalopathy   Marijuana dependence ([place])   DNR (do not resuscitate)  Resolved Problems:   * No resolved hospital problems. [facility] Course: HPI per Dr. Karmen Bongo [name] is a 71 y.o. male with medical history significant of afib on Eliquis; stage 3b CKD; chronic diastolic CHF; DM; COPD; HTN; HLD; hypothyroidism; and OSA not on CPAP presenting with chest pain.   He doesn't remember the last few days.  He denies hurting now.  The first day it was his stomach and leg and a bit of CP.  Yesterday, he had a lot of CP and he was given NTG a couple of times.  He is complaining of severe pain in his left leg - he has chronic L leg pain but not usually this severe.  The first doctor thought it was related to his umbilical hernia.  He vomited on Wednesday x 1, none since.  No change in cough.  Yesterday he was more SOB than usual.  He has been unable to eat for several days.  Today, he was confused about why he was here.  His wife gave him O2 yesterday, hasn't been wearing it regularly.  He desats with any activity.  His chest pain is gone.  He does have chronic n/v and  smokes marijuana regularly, but thinks this is unrelated.  He hasn't been able to eat/drink much this week due to n/v.         ER Course:  [facility] to [facility] transfer, per Dr. Myna Hidalgo:   Accepted from [facility] for chest pain. He is a 71 yr old with extensive PMHx including COPD on 2 Lpm, HFpEF, a fib on Eliquis, CHB with pacer, CKD IIIb, presenting with abdominal pain and chest pain. He was seen in ED twice on 2/9 for this and had no acute findings on CT abd/pelvis or RUQ [place], now has troponin 44 then 44, BNP 323, no acute findings on CXR, paced rhythm on EKG, and no change in symptoms with NTG. Dr. Florina Ou suspects his COPD may be contributing and is given him nebs.    **Interim History Patient's chest pain resolved but we have ordered an echocardiogram.  Given his history of chronic diastolic CHF and a history of complete heart block and permanent atrial fibrillation atypical flutter status post maze we will consult cardiology for further evaluation recommendations.  Since chest and abdominal pain have improved however his creatinine has slightly worsened and we will obtain PT OT to further evaluate and treat and they recommended no follow-up  BUN/creatinine improved and normalized.  Cardiology is consulted for further evaluation of his chest pain  in the felt that he was stable from their perspective and recommended discharging home as a plan no inpatient ischemic work-up given that his chest pain was very atypical.  Patient is deemed medically stable to be discharged home at this time and follow-up with PCP and cardiology in outpatient setting.  Assessment and Plan: * Chest pain- (present on admission) -Patient presented to [place] with c/o chest pain -Had multiple visits leading up to this presentation -Troponin mildly elevated but negative delta as it was 44 and repeat was 44 -Unremarkable EKG -Chest pain has resolved and patient has no current memory of this but does acknowledge n/v and abdominal  pain -Echocardiogram obtained and showed ejection fraction of 55 to 60% with normal left ventricular function and no wall motion abnormalities.  The right ventricular systolic function was normal and the left ventricular diastolic parameters were indeterminate.  The mitral valve was normal in structure there is mild dilatation of the ascending aorta and compared to the echo in 12/13/2019 AVG has increased from 8 to 11 mmHg -Cardiology evaluated and felt that his chest pain was atypical and recommended no further ischemic or inpatient work-up and recommended discharging home -Patient is medically stable to be discharged at this time and follow-up with PCP and EP cardiology outpatient  DNR (do not resuscitate)- (present on admission) -Dr. Lorin Mercy have discussed code status with the patient and his wife and they are in agreement that the patient would not desire resuscitation and would prefer to die a natural death should that situation arise. -He will need a gold out of facility DNR form at the time of discharge  Marijuana dependence ([place])- (present on admission) -Cessation encouraged; this should be encouraged on an ongoing basis -UDS ordered and still not done yet but he is stable to be discharged  Acute metabolic encephalopathy- (present on admission) -Upon arrival at [facility] he was found to be confused but now appears to be appropriate -Oriented to person and place and time now -He had removed his O2 and was in the 70s but hypoxic encephalopathy is a consideration -Negative head CT -Unremarkable ABG    Component Value Date/Time   PHART 7.516 (H) 06/09/2021 1647   PCO2ART 22.8 (L) 06/09/2021 1647   PO2ART 103 06/09/2021 1647   HCO3 18.3 (L) 06/09/2021 1647   TCO2 31 07/10/2020 0920   ACIDBASEDEF 4.2 (H) 06/09/2021 1647   O2SAT 98.5 06/09/2021 1647  -Will follow but encourage ongoing use of O2 -Patient appears to be has baseline. 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, as he is continuing to use -Imaging survey indicates cholelithiasis, but there is no evidence of cholecystitis -Overall, his labs and imaging appear benign at this time -By the time I saw him, he was no longer vomiting and asking for [employer] -He was observed and he had no further nausea vomiting and we encouraged cessation of marijuana and following up with a good bowel regimen  Chronic kidney disease, stage 3b ([place])- (present on admission) -His BUN/creatinine is slightly trending upwards and went from 26/1.85 -> 29/1.89 -> 39/2.06 and is now further improved to 30/1.58 -Avoid further nephrotoxic medications, contrast dyes, hypotension and renally adjust medications -Resume his home diuretics -Repeat CMP  within 1 week  Controlled type 2 diabetes mellitus with stage 3 chronic kidney disease, without long-term current use of insulin ([place])- (present on admission) -Hold Jardiance while hospitalized and resume in outpatient setting -Cover with moderate-scale SSI -Check HbA1c and was 6.6 -CBGs ranging from 104-148  Hypothyroidism- (present on admission) -Continue Synthroid for now but may need to adjust and cut back on the dose -TSH was 0.160 and will need repeat in 4 to 6 weeks with T4 and T3 evaluation  COPD mixed type ([place])- (present on admission) -No current evidence of exacerbation, but he clearly needs [place] O2 and rapidly desaturates without it -SpO2: 98 % O2 Flow Rate (L/min): 2 L/min FiO2 (%): 100 % -Continue Singulair, Breztri substitutions while hospitalized -He takes prednisone 2.5 mg daily, will continue for now  Typical atrial flutter (HCC)- (present on admission) -Pacemaker and he has a  persistent atrial fibrillation with complete heart block status post V paced rhythm pacer -Continue bisoprolol for rate control -Continue Eliquis for anticoagulation -Sees Dr. Rayann Heman in the outpatient setting but will need a new EP physician given that Dr. Rayann Heman is no longer consistently practicing and he will follow-up with MD tolerate the PA.  Chronic diastolic CHF (congestive heart failure) ([place])- (present on admission) -No current evidence of decompensation -Will follow -Continue demadex and aldactone -Renal function continues to worsen slightly -BNP was 322.7 -Strict I's and O's and daily weights.  Patient is +550 mL since admission -Currently appears euvolemic -Continue to monitor and trend for signs and symptoms of volume overload  Hypertension- (present on admission) -Continue bisoprolol, Norvasc -Continue to monitor blood pressures per protocol -Last pressure reading was 131/77   Pain control - [employer] Controlled Substance Reporting System database was reviewed. and patient was instructed, not to drive, operate heavy machinery, perform activities at heights, swimming or participation in water activities or provide baby-sitting services while on Pain, Sleep and Anxiety Medications; until their outpatient Physician has advised to do so again. Also recommended to not to take more than prescribed Pain, Sleep and Anxiety Medications.   Consultants: Cardiology Procedures performed: Echocardiogram Disposition: Home Diet recommendation:  Discharge Diet Orders (From admission, onward)     Start     Ordered   06/11/21 0000  Diet - low sodium heart healthy        06/11/21 1413           Cardiac and Carb modified diet  DISCHARGE MEDICATION: Allergies as of 06/11/2021       Reactions   Meloxicam Rash, Other (See Comments)   Red Man Syndrome, also   Vancomycin Other (See Comments)   Red Man's syndrome 09/02/15, resolved with diphenhydramine and slowing of rate         Medication List     TAKE these medications    albuterol 108 (90 Base) MCG/ACT inhaler Commonly known as: VENTOLIN HFA Inhale 2 puffs into the lungs every 6 (six) hours as needed for wheezing or shortness of breath.   amLODipine 5 MG tablet Commonly known as: NORVASC Take 2.5 mg by mouth daily.   apixaban 5 MG Tabs tablet Commonly known as: ELIQUIS Take 1 tablet (5 mg total) by mouth 2 (two) times daily.   bisoprolol 5 MG tablet Commonly known as: ZEBETA Take 1 tablet (5 mg total) by mouth daily. Needs appt What changed: additional instructions   Breztri Aerosphere 160-9-4.8 MCG/ACT Aero Generic drug: Budeson-Glycopyrrol-Formoterol Inhale 2 puffs into the lungs in the morning and at bedtime.  CAL-MAG-ZINC PO Take 1 tablet by mouth daily with breakfast.   dicyclomine 10 MG capsule Commonly known as: BENTYL Take 1 capsule (10 mg total) by mouth every 6 (six) hours as needed (For abdominal cramping).   empagliflozin 10 MG Tabs tablet Commonly known as: Jardiance Take 1 tablet (10 mg total) by mouth daily before breakfast. What changed: when to take this   fexofenadine 180 MG tablet Commonly known as: ALLEGRA Take 1 tablet (180 mg total) by mouth daily as needed for allergies or rhinitis.   gabapentin 300 MG capsule Commonly known as: NEURONTIN Take 600-1,200 mg by mouth See admin instructions. Take 600 mg by mouth in the morning, 1,200 mg at bedtime, and an additional 600 mg once a day as needed for neuropathy   levothyroxine 175 MCG tablet Commonly known as: SYNTHROID Take 175 mcg by mouth daily before breakfast.   LORazepam 1 MG tablet Commonly known as: Ativan Take 1 tablet (1 mg total) by mouth every 8 (eight) hours as needed (nausea and/or restlessness). What changed: when to take this   montelukast 10 MG tablet Commonly known as: SINGULAIR Take 1 tablet (10 mg total) by mouth at bedtime.   multivitamin with minerals Tabs tablet Take 1 tablet by mouth  daily.   ondansetron 4 MG disintegrating tablet Commonly known as: ZOFRAN-ODT Take 1 tablet (4 mg total) by mouth every 8 (eight) hours as needed for nausea or vomiting. What changed: reasons to take this   oxyCODONE-acetaminophen 5-325 MG tablet Commonly known as: PERCOCET/ROXICET Take 1 tablet by mouth 2 (two) times daily.   OXYGEN Inhale 2 L/min into the lungs See admin instructions. 2 L/min at bedtime and 2 L/min throughout the day as needed for shortness of breath   oxymetazoline 0.05 % nasal spray Commonly known as: AFRIN Place 1 spray into both nostrils as needed (for nose bleeds).   pantoprazole 40 MG tablet Commonly known as: PROTONIX Take 40 mg by mouth daily before breakfast.   polyethylene glycol powder 17 GM/SCOOP powder Commonly known as: GLYCOLAX/MIRALAX Take 17 g by mouth daily as needed for mild constipation.   potassium chloride SA 20 MEQ tablet Commonly known as: KLOR-CON M Take 20 mEq by mouth See admin instructions. Take 20 mEq by mouth two times a day on the days when Torsemide is taken   predniSONE 5 MG tablet Commonly known as: DELTASONE Take 1 tablet (5 mg total) by mouth daily with breakfast. What changed: how much to take   promethazine 25 MG tablet Commonly known as: PHENERGAN Take 1 tablet (25 mg total) by mouth every 6 (six) hours as needed for nausea or vomiting.   rOPINIRole 1 MG tablet Commonly known as: REQUIP Take 1 tablet (1 mg total) by mouth in the morning and at bedtime.   rosuvastatin 10 MG tablet Commonly known as: CRESTOR Take 10 mg by mouth at bedtime.   spironolactone 25 MG tablet Commonly known as: ALDACTONE Take 1 tablet (25 mg total) by mouth daily.   Systane Ultra PF 0.4-0.3 % Soln Generic drug: Polyethyl Glyc-Propyl Glyc PF Apply 1 drop to eye 3 (three) times daily as needed (for dryness).   TIGER BALM ARTHRITIS RUB EX Apply 1 application topically as needed (for leg pain).   torsemide 20 MG tablet Commonly  known as: DEMADEX Take 30 mg by mouth every other day.   traMADol 50 MG tablet Commonly known as: ULTRAM Take 100 mg by mouth 2 (two) times daily.   traZODone 50 MG  tablet Commonly known as: DESYREL Take 50 mg by mouth at bedtime.   Vitamin D3 Super Strength 50 MCG (2000 UT) Caps Generic drug: Cholecalciferol Take 8,000 Units by mouth at bedtime.         Discharge Exam: Filed Weights   06/08/21 2258  Weight: 79.4 kg   Vitals:   06/11/21 0440 06/11/21 0854  BP: 131/77   Pulse: 66   Resp: 20   Temp: 97.6 F (36.4 C)   SpO2: 98% 100%   Examination: Physical Exam:  Constitutional: WN/WD, NAD and appears calm and comfortable Respiratory: Diminished to auscultation bilaterally with coarse breath sounds, no wheezing, rales, rhonchi or crackles. Normal respiratory effort and patient is not tachypenic. No accessory muscle use but is wearing 2 Liters of supplemental oxygen via nasal cannula Cardiovascular: Paced rhythm but regular rate, no murmurs / rubs / gallops. S1 and S2 auscultated.  He has hemosiderin deposition in his lower extremities from chronic leg swelling Abdomen: Soft, non-tender, distended second body habitus in his mid umbilical hernia.  Bowel sounds present. Bowel sounds positive.  GU: Deferred.  Condition at discharge: stable  The results of significant diagnostics from this hospitalization (including imaging, microbiology, ancillary and laboratory) are listed below for reference.   Imaging Studies: CT HEAD WO CONTRAST (5MM)  Result Date: 06/09/2021 CLINICAL DATA:  Delirium. EXAM: CT HEAD WITHOUT CONTRAST TECHNIQUE: Contiguous axial images were obtained from the base of the skull through the vertex without intravenous contrast. RADIATION DOSE REDUCTION: This exam was performed according to the departmental dose-optimization program which includes automated exposure control, adjustment of the mA and/or kV according to patient size and/or use of iterative  reconstruction technique. COMPARISON:  09/03/2015 FINDINGS: Brain: No evidence of acute infarction, hemorrhage, hydrocephalus, extra-axial collection or mass lesion/mass effect. There is mild diffuse low-attenuation within the subcortical and periventricular white matter compatible with chronic microvascular disease. Vascular: No hyperdense vessel or unexpected calcification. Skull: Normal. Negative for fracture or focal lesion. Sinuses/Orbits: Retention cyst or polyp is identified within the right maxillary sinus measuring 1 point 4 cm, image 81/4. Previously this measured 1.1 cm. Other: None. IMPRESSION: 1. No acute intracranial abnormalities. 2. Chronic small vessel ischemic disease. 3. Right maxillary sinus retention cyst or polyp. Electronically Signed   By: Kerby Moors M.D.   On: 06/09/2021 18:24   MR CERVICAL SPINE WO CONTRAST  Result Date: 05/23/2021 CLINICAL DATA:  Chronic neck pain extending into the shoulders and arms with bilateral hand weakness. Chronic low back pain extending into both legs with numbness. History of back surgery x3. EXAM: MRI CERVICAL AND LUMBAR SPINE WITHOUT CONTRAST TECHNIQUE: Multiplanar and multiecho pulse sequences of the cervical spine, to include the craniocervical junction and cervicothoracic junction, and lumbar spine, were obtained without intravenous contrast. COMPARISON:  No prior relevant imaging of the cervical spine. CT lumbar spine 12/10/2020. Abdominopelvic CT 08/09/2020. FINDINGS: Despite efforts by the technologist and patient, mild motion artifact is present on today's exam and could not be eliminated. This reduces exam sensitivity and specificity. MRI CERVICAL SPINE FINDINGS Alignment: Physiologic. Vertebrae: No acute or suspicious osseous findings. Cord: Normal in signal and caliber. Posterior Fossa, vertebral arteries, paraspinal tissues: Visualized portions of the posterior fossa appear unremarkable.Bilateral vertebral artery flow voids. No significant  paraspinal findings. Disc levels: C2-3: The disc appears normal. Asymmetric facet hypertrophy on the left contributes to mild left foraminal narrowing. No cord deformity. C3-4: Mild disc bulging and uncinate spurring with asymmetric left-sided facet hypertrophy. No spinal stenosis. Mild left greater  than right foraminal narrowing. C4-5: Mild loss of disc height with disc bulging and bilateral uncinate spurring. The CSF surrounding the cord is partially effaced without cord deformity. Moderate foraminal narrowing is present bilaterally with potential encroachment on either C5 nerve root. C5-6: Moderate loss of disc height with posterior osteophytes covering diffusely bulging disc material and bilateral uncinate spurring. The CSF surrounding the cord is partially effaced without cord deformity. Moderate foraminal narrowing bilaterally which could affect either C6 nerve root. C6-7: Mild disc bulging and uncinate spurring. Mild spinal stenosis without cord deformity. The foramina appear sufficiently patent. C7-T1: Small central disc protrusion and mild bilateral facet hypertrophy. No spinal stenosis or significant foraminal narrowing. MRI LUMBAR SPINE FINDINGS Segmentation: Conventional anatomy assumed, with the last open disc space designated L5-S1.Concordant with previous imaging. Alignment:  Physiologic. Vertebrae: No worrisome osseous lesion, acute fracture or pars defect. The visualized sacroiliac joints appear unremarkable. Conus medullaris: Extends to the L1 level and appears normal. Paraspinal and other soft tissues: No acute paraspinal abnormalities identified. There is aortic and branch vessel atherosclerosis. There is chronic right renal cortical scarring. Posteriorly in the interpolar region of the left kidney is a 2.0 cm T2 hypointense lesion (image 8/6) which appears similar in size to abdominopelvic CT 08/09/2020. This underwent biopsy under ultrasound 11/15/2020. There is prominent fatty atrophy of the  erector spinae musculature bilaterally. Disc levels: No significant disc space findings from T11-12 through L2-3. L3-4: Stable disc bulging with endplate osteophytes asymmetric to the right and mild bilateral facet and ligamentous hypertrophy. Stable asymmetric mild narrowing of the right lateral recess and right foramen. L4-5: Left-sided laminectomy. Stable mild disc bulging and bilateral facet hypertrophy. Mild lateral recess narrowing bilaterally without nerve root encroachment. The foramina appear sufficiently patent. L5-S1: Probable postsurgical changes at this level. Mild disc bulging, facet and ligamentous hypertrophy with mild narrowing of the lateral recesses, similar to previous CT. The foramina appear sufficiently patent. IMPRESSION: 1. Multilevel cervical spondylosis as detailed above. The CSF surrounding the cord is effaced at C4-5 and C5-6 without cord deformity or abnormal cord signal. 2. Multilevel foraminal narrowing in the cervical spine which may affect the exiting nerve roots, greatest bilaterally at C4-5 and C5-6. 3. Mild lumbar spondylosis which appears similar to previous CT of 5 months ago. There is mild lateral recess narrowing without high-grade spinal stenosis or definite nerve root encroachment. 4. A previously biopsied left renal lesion is grossly unchanged in size. Electronically Signed   By: Richardean Sale M.D.   On: 05/23/2021 16:27   MR LUMBAR SPINE WO CONTRAST  Result Date: 05/23/2021 CLINICAL DATA:  Chronic neck pain extending into the shoulders and arms with bilateral hand weakness. Chronic low back pain extending into both legs with numbness. History of back surgery x3. EXAM: MRI CERVICAL AND LUMBAR SPINE WITHOUT CONTRAST TECHNIQUE: Multiplanar and multiecho pulse sequences of the cervical spine, to include the craniocervical junction and cervicothoracic junction, and lumbar spine, were obtained without intravenous contrast. COMPARISON:  No prior relevant imaging of the  cervical spine. CT lumbar spine 12/10/2020. Abdominopelvic CT 08/09/2020. FINDINGS: Despite efforts by the technologist and patient, mild motion artifact is present on today's exam and could not be eliminated. This reduces exam sensitivity and specificity. MRI CERVICAL SPINE FINDINGS Alignment: Physiologic. Vertebrae: No acute or suspicious osseous findings. Cord: Normal in signal and caliber. Posterior Fossa, vertebral arteries, paraspinal tissues: Visualized portions of the posterior fossa appear unremarkable.Bilateral vertebral artery flow voids. No significant paraspinal findings. Disc levels: C2-3: The disc appears  normal. Asymmetric facet hypertrophy on the left contributes to mild left foraminal narrowing. No cord deformity. C3-4: Mild disc bulging and uncinate spurring with asymmetric left-sided facet hypertrophy. No spinal stenosis. Mild left greater than right foraminal narrowing. C4-5: Mild loss of disc height with disc bulging and bilateral uncinate spurring. The CSF surrounding the cord is partially effaced without cord deformity. Moderate foraminal narrowing is present bilaterally with potential encroachment on either C5 nerve root. C5-6: Moderate loss of disc height with posterior osteophytes covering diffusely bulging disc material and bilateral uncinate spurring. The CSF surrounding the cord is partially effaced without cord deformity. Moderate foraminal narrowing bilaterally which could affect either C6 nerve root. C6-7: Mild disc bulging and uncinate spurring. Mild spinal stenosis without cord deformity. The foramina appear sufficiently patent. C7-T1: Small central disc protrusion and mild bilateral facet hypertrophy. No spinal stenosis or significant foraminal narrowing. MRI LUMBAR SPINE FINDINGS Segmentation: Conventional anatomy assumed, with the last open disc space designated L5-S1.Concordant with previous imaging. Alignment:  Physiologic. Vertebrae: No worrisome osseous lesion, acute  fracture or pars defect. The visualized sacroiliac joints appear unremarkable. Conus medullaris: Extends to the L1 level and appears normal. Paraspinal and other soft tissues: No acute paraspinal abnormalities identified. There is aortic and branch vessel atherosclerosis. There is chronic right renal cortical scarring. Posteriorly in the interpolar region of the left kidney is a 2.0 cm T2 hypointense lesion (image 8/6) which appears similar in size to abdominopelvic CT 08/09/2020. This underwent biopsy under ultrasound 11/15/2020. There is prominent fatty atrophy of the erector spinae musculature bilaterally. Disc levels: No significant disc space findings from T11-12 through L2-3. L3-4: Stable disc bulging with endplate osteophytes asymmetric to the right and mild bilateral facet and ligamentous hypertrophy. Stable asymmetric mild narrowing of the right lateral recess and right foramen. L4-5: Left-sided laminectomy. Stable mild disc bulging and bilateral facet hypertrophy. Mild lateral recess narrowing bilaterally without nerve root encroachment. The foramina appear sufficiently patent. L5-S1: Probable postsurgical changes at this level. Mild disc bulging, facet and ligamentous hypertrophy with mild narrowing of the lateral recesses, similar to previous CT. The foramina appear sufficiently patent. IMPRESSION: 1. Multilevel cervical spondylosis as detailed above. The CSF surrounding the cord is effaced at C4-5 and C5-6 without cord deformity or abnormal cord signal. 2. Multilevel foraminal narrowing in the cervical spine which may affect the exiting nerve roots, greatest bilaterally at C4-5 and C5-6. 3. Mild lumbar spondylosis which appears similar to previous CT of 5 months ago. There is mild lateral recess narrowing without high-grade spinal stenosis or definite nerve root encroachment. 4. A previously biopsied left renal lesion is grossly unchanged in size. Electronically Signed   By: Richardean Sale M.D.   On:  05/23/2021 16:27   CT Abdomen Pelvis W Contrast  Result Date: 06/07/2021 CLINICAL DATA:  Abdominal pain with nausea and vomiting. 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 RADIATION DOSE REDUCTION: This exam was performed according to the departmental dose-optimization program which includes automated exposure control, adjustment of the mA and/or kV according to patient size and/or use of iterative reconstruction technique. CONTRAST:  48mL OMNIPAQUE IOHEXOL 300 MG/ML  SOLN COMPARISON:  08/09/2020, 02/14/2021 FINDINGS: Lower chest: No acute abnormality. Hepatobiliary: No focal liver abnormality is seen. Tiny stones again noted within the gallbladder. Gallbladder appears otherwise unremarkable. No biliary  dilatation. Pancreas: Unremarkable. No pancreatic ductal dilatation or surrounding inflammatory changes. Spleen: Normal in size without focal abnormality. Adrenals/Urinary Tract: Unremarkable adrenal glands. Interval changes of cryo ablation involving the interpolar aspect of the left kidney at site of previously seen enhancing mass. No new solid or enhancing component is evident on the current exam. Small cyst at the inferior pole. Chronic scarring in atrophy of the right kidney. No renal stone or hydronephrosis. Urinary bladder within normal limits. Stomach/Bowel: Stomach is within normal limits. Appendix appears normal. No evidence of bowel wall thickening, distention, or inflammatory changes. Vascular/Lymphatic: Aortic atherosclerosis. No enlarged abdominal or pelvic lymph nodes. Reproductive: Prostate is unremarkable. Other: Fat containing umbilical and left inguinal hernias. No ascites. No pneumoperitoneum. Musculoskeletal: No acute or significant osseous findings.  IMPRESSION: 1. No acute abdominopelvic findings. 2. Interval changes of cryoablation involving the left kidney at site of previously seen enhancing mass. No new solid or enhancing component is evident on the current exam. 3. Cholelithiasis without evidence of acute cholecystitis. 4. Fat containing umbilical and left inguinal hernias. Aortic Atherosclerosis (ICD10-I70.0). Electronically Signed   By: Davina Poke D.O.   On: 06/07/2021 09:23   DG Chest Portable 1 View  Result Date: 06/08/2021 CLINICAL DATA:  Shortness of breath EXAM: PORTABLE CHEST 1 VIEW COMPARISON:  06/07/2021, 12/08/2020 FINDINGS: Post sternotomy changes. Left-sided pacing device and valve prosthesis as before. Atrial appendage clip. Borderline to mild cardiomegaly. No overt pulmonary edema. IMPRESSION: No active disease. Electronically Signed   By: Donavan Foil M.D.   On: 06/08/2021 23:34   DG Chest Port 1 View  Result Date: 06/07/2021 CLINICAL DATA:  Chest pain, cough EXAM: PORTABLE CHEST 1 VIEW COMPARISON:  12/08/2020 FINDINGS: Transverse diameter of heart is within normal limits. There is prosthetic aortic valve. There is a metallic clamp in the region of left atrial appendage. Pacemaker battery is seen in the left infraclavicular region. There are no signs of pulmonary edema or focal pulmonary consolidation. There is no pleural effusion or pneumothorax. IMPRESSION: No active disease. Electronically Signed   By: Elmer Picker M.D.   On: 06/07/2021 09:00   ECHOCARDIOGRAM COMPLETE  Result Date: 06/10/2021    ECHOCARDIOGRAM REPORT   Patient Name:   [name] Date of Exam: 06/10/2021 Medical Rec #:  [id number]       Height:       71.0 in Accession #:    [id number]      Weight:       175.0 lb Date of Birth:  [birth year]       BSA:          1.992 m Patient Age:    23 years        BP:           136/68 mmHg Patient Gender: M               HR:           63 bpm. Exam Location:  Inpatient Procedure: 2D Echo Indications:    chest  pain  History:        Patient has prior history of Echocardiogram examinations, most                 recent 07/12/2020. CHF, Pacemaker, COPD and chronic kidney                 disease; Risk Factors:Hypertension.                 Aortic Valve: bioprosthetic  valve is present in the aortic                 position.  Sonographer:    Johny Chess RDCS Referring Phys: [id number] Georgina Quint LATIF Pickering  1. Left ventricular ejection fraction, by estimation, is 55 to 60%. The left ventricle has normal function. The left ventricle has no regional wall motion abnormalities. Left ventricular diastolic parameters are indeterminate.  2. Right ventricular systolic function is normal. The right ventricular size is normal. The estimated right ventricular systolic pressure is 41.2 mmHg.  3. The mitral valve is normal in structure. Trivial mitral valve regurgitation. No evidence of mitral stenosis.  4. The aortic valve has been repaired/replaced. Aortic valve regurgitation is not visualized. No aortic stenosis is present. There is a bioprosthetic valve present in the aortic position. Aortic valve area, by VTI measures 1.63 cm. Aortic valve Vmax measures 2.28 m/s. Aortic valve mean gradient measures 11.0 mmHg. Aortic valve peak gradient measures 20.8 mmHg.  5. There is mild dilatation of the ascending aorta, measuring 38 mm.  6. The inferior vena cava is normal in size with greater than 50% respiratory variability, suggesting right atrial pressure of 3 mmHg.  7. Compared to echo 12/13/2019, the mean AVG has increased from 8 to 46mmhg. DVi has dcreased from 0.65 to 0.57 and AVA has decreased from 2.04cm2 to 1.63cm2. The AVA calculation is likely underestimated due to small LVOT measurement.  8. Left atrial size was mildly dilated. FINDINGS  Left Ventricle: Left ventricular ejection fraction, by estimation, is 55 to 60%. The left ventricle has normal function. The left ventricle has no regional wall motion abnormalities. The left  ventricular internal cavity size was normal in size. There is  no left ventricular hypertrophy. Left ventricular diastolic parameters are indeterminate. Normal left ventricular filling pressure. Right Ventricle: The right ventricular size is normal. No increase in right ventricular wall thickness. Right ventricular systolic function is normal. The tricuspid regurgitant velocity is 2.47 m/s, and with an assumed right atrial pressure of 8 mmHg, the estimated right ventricular systolic pressure is 87.8 mmHg. Left Atrium: Left atrial size was mildly dilated. Right Atrium: Right atrial size was normal in size. Pericardium: There is no evidence of pericardial effusion. Mitral Valve: The mitral valve is normal in structure. Mild mitral annular calcification. Trivial mitral valve regurgitation. No evidence of mitral valve stenosis. Tricuspid Valve: The tricuspid valve is normal in structure. Tricuspid valve regurgitation is trivial. No evidence of tricuspid stenosis. Aortic Valve: The aortic valve has been repaired/replaced. Aortic valve regurgitation is not visualized. No aortic stenosis is present. Aortic valve mean gradient measures 11.0 mmHg. Aortic valve peak gradient measures 20.8 mmHg. Aortic valve area, by VTI measures 1.63 cm. There is a bioprosthetic valve present in the aortic position. Pulmonic Valve: The pulmonic valve was normal in structure. Pulmonic valve regurgitation is not visualized. No evidence of pulmonic stenosis. Aorta: The aortic root is normal in size and structure. There is mild dilatation of the ascending aorta, measuring 38 mm. Venous: The inferior vena cava is normal in size with greater than 50% respiratory variability, suggesting right atrial pressure of 3 mmHg. IAS/Shunts: No atrial level shunt detected by color flow Doppler. Additional Comments: A device lead is visualized.  LEFT VENTRICLE PLAX 2D LVIDd:         4.90 cm   Diastology LVIDs:         3.30 cm   LV e' medial:    6.20 cm/s LV  PW:  1.00 cm   LV E/e' medial:  17.7 LV IVS:        1.00 cm   LV e' lateral:   10.92 cm/s LVOT diam:     1.90 cm   LV E/e' lateral: 10.1 LV SV:         75 LV SV Index:   38 LVOT Area:     2.84 cm  RIGHT VENTRICLE            IVC RV S prime:     9.68 cm/s  IVC diam: 2.20 cm TAPSE (M-mode): 1.4 cm LEFT ATRIUM             Index        RIGHT ATRIUM           Index LA diam:        4.00 cm 2.01 cm/m   RA Area:     20.50 cm LA Vol (A2C):   76.4 ml 38.34 ml/m  RA Volume:   62.10 ml  31.17 ml/m LA Vol (A4C):   64.3 ml 32.27 ml/m LA Biplane Vol: 72.5 ml 36.39 ml/m  AORTIC VALVE AV Area (Vmax):    1.71 cm AV Area (Vmean):   1.66 cm AV Area (VTI):     1.63 cm AV Vmax:           228.00 cm/s AV Vmean:          149.000 cm/s AV VTI:            0.461 m AV Peak Grad:      20.8 mmHg AV Mean Grad:      11.0 mmHg LVOT Vmax:         137.50 cm/s LVOT Vmean:        87.450 cm/s LVOT VTI:          0.265 m LVOT/AV VTI ratio: 0.57  AORTA Ao Asc diam: 3.80 cm MITRAL VALVE                TRICUSPID VALVE MV Area (PHT): 4.24 cm     TR Peak grad:   24.4 mmHg MV Decel Time: 179 msec     TR Vmax:        247.00 cm/s MV E velocity: 110.00 cm/s MV A velocity: 32.00 cm/s   SHUNTS MV E/A ratio:  3.44         Systemic VTI:  0.26 m                             Systemic Diam: 1.90 cm Fransico Him MD Electronically signed by Fransico Him MD Signature Date/Time: 06/10/2021/2:50:53 PM    Final    US Abdomen Limited RUQ (LIVER/GB)  Result Date: 06/08/2021 CLINICAL DATA:  Right upper quadrant abdominal pain EXAM: ULTRASOUND ABDOMEN LIMITED RIGHT UPPER QUADRANT COMPARISON:  CT abdomen/pelvis dated 06/07/2021 FINDINGS: Gallbladder: 6 mm gallstone within the gallbladder neck. No gallbladder wall thickening or pericholecystic fluid. Negative sonographic Murphy's sign. Common bile duct: Diameter: 4 mm Liver: No focal lesion identified. Within normal limits in parenchymal echogenicity. Portal vein is patent on color Doppler imaging with normal  direction of blood flow towards the liver. Other: Right renal atrophy with increased parenchymal echogenicity. IMPRESSION: Cholelithiasis, without associated sonographic findings to suggest acute cholecystitis. Electronically Signed   By: Julian Hy M.D.   On: 06/08/2021 00:52    Microbiology: Results for orders placed or performed during the hospital encounter of  06/07/21  Resp Panel by RT-PCR (Flu A&B, Covid) Nasopharyngeal Swab     Status: None   Collection Time: 06/07/21  8:06 AM   Specimen: Nasopharyngeal Swab; Nasopharyngeal(NP) swabs in vial transport medium  Result Value Ref Range Status   SARS Coronavirus 2 by RT PCR NEGATIVE NEGATIVE Final    Comment: (NOTE) SARS-CoV-2 target nucleic acids are NOT DETECTED.  The SARS-CoV-2 RNA is generally detectable in upper respiratory specimens during the acute phase of infection. The lowest concentration of SARS-CoV-2 viral copies this assay can detect is 138 copies/mL. A negative result does not preclude SARS-Cov-2 infection and should not be used as the sole basis for treatment or other patient management decisions. A negative result may occur with  improper specimen collection/handling, submission of specimen other than nasopharyngeal swab, presence of viral mutation(s) within the areas targeted by this assay, and inadequate number of viral copies(<138 copies/mL). A negative result must be combined with clinical observations, patient history, and epidemiological information. The expected result is Negative.  Fact Sheet for Patients:  EntrepreneurPulse.com.au  Fact Sheet for Healthcare Providers:  IncredibleEmployment.be  This test is no t yet approved or cleared by the [place] FDA and  has been authorized for detection and/or diagnosis of SARS-CoV-2 by FDA under an Emergency Use Authorization (EUA). This EUA will remain  in effect (meaning this test can be used) for the duration of  the COVID-19 declaration under Section 564(b)(1) of the Act, 21 U.S.C.section 360bbb-3(b)(1), unless the authorization is terminated  or revoked sooner.       Influenza A by PCR NEGATIVE NEGATIVE Final   Influenza B by PCR NEGATIVE NEGATIVE Final    Comment: (NOTE) The Xpert Xpress SARS-CoV-2/FLU/RSV plus assay is intended as an aid in the diagnosis of influenza from Nasopharyngeal swab specimens and should not be used as a sole basis for treatment. Nasal washings and aspirates are unacceptable for Xpert Xpress SARS-CoV-2/FLU/RSV testing.  Fact Sheet for Patients: EntrepreneurPulse.com.au  Fact Sheet for Healthcare Providers: IncredibleEmployment.be  This test is not yet approved or cleared by the [place] FDA and has been authorized for detection and/or diagnosis of SARS-CoV-2 by FDA under an Emergency Use Authorization (EUA). This EUA will remain in effect (meaning this test can be used) for the duration of the COVID-19 declaration under Section 564(b)(1) of the Act, 21 U.S.C. section 360bbb-3(b)(1), unless the authorization is terminated or revoked.  Performed at [facility], [address] [place], [place] [address]     Labs: CBC: Recent Labs  Lab 06/07/21 0804 06/07/21 2300 06/08/21 2059 06/10/21 0302 06/11/21 0325  WBC 8.5 9.7 10.8* 10.9* 8.3  NEUTROABS 6.9 8.3*  --   --  6.1  HGB 15.1 15.4 14.9 14.0 14.1  HCT 46.8 47.5 45.9 43.5 43.9  MCV 88.1 88.6 87.9 88.4 89.6  PLT 154 190 203 166 734*   Basic Metabolic Panel: Recent Labs  Lab 06/07/21 0804 06/07/21 2300 06/08/21 2059 06/10/21 0302 06/11/21 0325  NA 138 138 138 138 136  K 4.3 4.5 4.2 3.5 3.9  CL 98 100 103 105 105  CO2 25 25 22  19* 23  GLUCOSE 124* 125* 115* 136* 113*  BUN 27* 26* 29* 39* 30*  CREATININE 1.78* 1.85* 1.89* 2.06* 1.58*  CALCIUM 10.4* 10.0 10.2 9.3 8.9  MG  --   --   --   --  2.0  PHOS  --   --   --   --  3.1  Liver  Function Tests: Recent Labs  Lab 06/07/21 0804 06/07/21 2300 06/10/21 0302 06/11/21 0325  AST 27 24 24 21   ALT 23 20 18 18   ALKPHOS 78 76 58 53  BILITOT 1.3* 1.4* 0.8 0.5  PROT 8.0 8.0 6.6 6.1*  ALBUMIN 4.6 4.6 3.8 3.6   CBG: Recent Labs  Lab 06/10/21 2058 06/11/21 0751 06/11/21 1157  GLUCAP 148* 118* 104*    Discharge time spent: greater than 30 minutes.  Signed: Raiford Noble, DO [facility] 06/11/2021

## 2021-06-11 NOTE — Telephone Encounter (Signed)
Advanced Heart Failure Patient Advocate Encounter   Patient was approved to receive Jardiance from [place]  Effective dates: 05/02/21 through 04/28/22  Charlann Boxer, CPhT

## 2021-06-11 NOTE — Progress Notes (Signed)
OT Cancellation Note  Patient Details Name: [name] MRN: [id number] DOB: Sep 06, 1950   Cancelled Treatment:    Reason Eval/Treat Not Completed: OT screened, no needs identified, will sign off. Functioning independently.  Malka So 06/11/2021, 8:14 AM Nestor Lewandowsky, OTR/L Acute Rehabilitation Services Pager: [phone] Office: [phone]

## 2021-06-11 NOTE — Consult Note (Addendum)
Cardiology Consultation:   Patient ID: LUNDY COZART MRN: [id number]; DOB: [birth year]  Admit date: 06/08/2021 Date of Consult: 06/11/2021  PCP:  Velna Hatchet, MD   [facility] Providers Cardiologist:  None  Electrophysiologist:  Thompson Grayer, MD  Advanced Heart Failure:  Glori Bickers, MD  {   Patient Profile:   [name] is a 71 y.o. male with a hx of chronic respiratory failure on 2L oxygen, persistent atrial fibrillation s/p ablation in 2013 & 03/2020 and multiple cardioversions, COPD, hypertension, aortic stenosis s/p AVR and maze, complete heart block s/p pacemaker implantation, chronic diastolic heart function with RV failure/pulmonary hypertension, CKD stage III and diabetes mellitus who is being seen 06/11/2021 for the evaluation of chest pain at the request of Dr. Alfredia Ferguson.  Cardiac catheterization April 2017 showed mild nonobstructive CAD.  There was severe aortic stenosis. He underwent 07/2015 Aortic Valve Replacment using a 25 mm Edwards Magna Ease Pericardial Tissue Valve, Left Side MAZE procedure, and clipping of LA Appendage.    Patient had multiple right heart cath afterwards due to chronic dyspnea.  It was felt multifactorial from CHF, lung disease and pulmonary hypertension.  Echocardiogram March 2022 showed LV function of 55 to 68%, grade 3 diastolic dysfunction (restrictive physiology).  Normal functioning bioprosthetic aortic valve with mean gradient of 7 mmHg.  Last seen by Dr. Haroldine Laws March 2022.  Patient underwent GEN change out August 18, 2020.  Last seen by Dr. Rayann Heman August 2022.  Did not felt candidate for further EP procedure or medications.  History of Present Illness:   [name] initially presented to [place] early morning of 2/9 with abdominal pain, nausea and vomiting.  CT of abdomen showed cholelithiasis without evidence of acute cholecystitis.  Elevated BUN and creatinine.  He was discharged home however due to ongoing  symptoms he came back in evening for further evaluation.  Pain improved after fentanyl and Bentyl.  Felt symptoms mostly due to some sort of GI illness.  He was discharged home from ER again.  However, return again 9/10 with generalized abdominal pain, nausea, vomiting, malaise as well as chest discomfort.  He was admitted for further evaluation due to acute metabolic encephalopathy, abdominal and chest discomfort.  RUQ [place] with cholelithiasis, without associated sonographic findings to suggest acute cholecystitis.  Hs-troponin 44 x2 BNP 322 Scr 1.78>>1.85>>1.89>>2.06>>1.58 today   Today, patient feels normal and back to his baseline.  He reports this was started with abdominal pain and then chest pain.  He describes his chest pain as sharp intermittent sensation for 2 days prior to admission.  He denied associated symptoms.  He reports he never had vomiting but he was belching a lot.  He denies palpitation, orthopnea, PND, syncope, lower extremity edema.   Echo 06/10/2021  1. Left ventricular ejection fraction, by estimation, is 55 to 60%. The  left ventricle has normal function. The left ventricle has no regional  wall motion abnormalities. Left ventricular diastolic parameters are  indeterminate.   2. Right ventricular systolic function is normal. The right ventricular  size is normal. The estimated right ventricular systolic pressure is 12.7  mmHg.   3. The mitral valve is normal in structure. Trivial mitral valve  regurgitation. No evidence of mitral stenosis.   4. The aortic valve has been repaired/replaced. Aortic valve  regurgitation is not visualized. No aortic stenosis is present. There is a  bioprosthetic valve present in the aortic position. Aortic valve area, by  VTI measures 1.63 cm.  Aortic valve Vmax  measures 2.28 m/s. Aortic valve mean gradient measures 11.0 mmHg. Aortic  valve peak gradient measures 20.8 mmHg.   5. There is mild dilatation of the ascending aorta, measuring  38 mm.   6. The inferior vena cava is normal in size with greater than 50%  respiratory variability, suggesting right atrial pressure of 3 mmHg.   7. Compared to echo 12/13/2019, the mean AVG has increased from 8 to  26mmhg. DVi has dcreased from 0.65 to 0.57 and AVA has decreased from  2.04cm2 to 1.63cm2. The AVA calculation is likely underestimated due to  small LVOT measurement.   8. Left atrial size was mildly dilated.   Past Medical History:  Diagnosis Date   Aortic stenosis, mild    Arthritis of hand    "just a little bit in both hands" (03/31/2012)   Asthma    "little bit" (03/31/2012)   Atrial fibrillation ([place])    dx '04; DCCV '04, placed on flecainide, failed DCCV 04/2010, flecainide stopped; s/p successful A.fib ablation 01/31/12   CHF (congestive heart failure) ([place])    Controlled type 2 diabetes mellitus without complication, without long-term current use of insulin ([place]) 12/13/2019   COPD (chronic obstructive pulmonary disease) (HCC)    DJD (degenerative joint disease)    Fibromyalgia    GERD (gastroesophageal reflux disease)    Hyperlipidemia    Hypertension    Hypothyroidism    S/P radiation   Left atrial enlargement    LA size 84mm by echo 11/21/11   Mitral regurgitation    trivial   Obstructive sleep apnea    mild by sleep study 2013; pt stated he does not have a machine because "it wasn't bad enough for him to have one"   Pacemaker 03/31/2012   Restless leg syndrome    Second degree AV block    Splenomegaly    Visit for monitoring Tikosyn therapy 04/16/2016    Past Surgical History:  Procedure Laterality Date   AORTIC VALVE REPLACEMENT N/A 08/22/2015   Procedure: AORTIC VALVE REPLACEMENT (AVR);  Surgeon: Ivin Poot, MD;  Location: [place];  Service: Open Heart Surgery;  Laterality: N/A;   ATRIAL FIBRILLATION ABLATION  01/30/2012   PVI by Dr. Rayann Heman   ATRIAL FIBRILLATION ABLATION N/A 01/31/2012   Procedure: ATRIAL FIBRILLATION ABLATION;  Surgeon: Thompson Grayer, MD;  Location: [facility] CATH LAB;  Service: Cardiovascular;  Laterality: N/A;   ATRIAL FIBRILLATION ABLATION N/A 04/19/2020   Procedure: ATRIAL FIBRILLATION ABLATION;  Surgeon: Thompson Grayer, MD;  Location: [place] CV LAB;  Service: Cardiovascular;  Laterality: N/A;   BACK SURGERY     X 3   CARDIAC CATHETERIZATION N/A 08/09/2015   Procedure: Right/Left Heart Cath and Coronary Angiography;  Surgeon: Peter M Martinique, MD;  Location: [place] CV LAB;  Service: Cardiovascular;  Laterality: N/A;   CARDIAC CATHETERIZATION N/A 02/05/2016   Procedure: Right/Left Heart Cath and Coronary Angiography;  Surgeon: Jettie Booze, MD;  Location: [place] CV LAB;  Service: Cardiovascular;  Laterality: N/A;   CARDIAC CATHETERIZATION N/A 04/17/2016   Procedure: Right Heart Cath;  Surgeon: Jolaine Artist, MD;  Location: [place] CV LAB;  Service: Cardiovascular;  Laterality: N/A;   CARDIOVASCULAR STRESS TEST  03/12/2002   EF 48%, NO EVIDENCE OF ISCHEMIA   CARDIOVERSION  01/2012; 03/31/2012   CARDIOVERSION N/A 02/25/2012   Procedure: CARDIOVERSION;  Surgeon: Thompson Grayer, MD;  Location: [facility] CATH LAB;  Service: Cardiovascular;  Laterality: N/A;   CARDIOVERSION  N/A 03/31/2012   Procedure: CARDIOVERSION;  Surgeon: Thompson Grayer, MD;  Location: [facility] CATH LAB;  Service: Cardiovascular;  Laterality: N/A;   CARDIOVERSION N/A 11/23/2015   Procedure: CARDIOVERSION;  Surgeon: Larey Dresser, MD;  Location: [place];  Service: Cardiovascular;  Laterality: N/A;   CARDIOVERSION N/A 04/08/2016   Procedure: CARDIOVERSION;  Surgeon: Jolaine Artist, MD;  Location: [place];  Service: Cardiovascular;  Laterality: N/A;   CARDIOVERSION N/A 09/14/2018   Procedure: CARDIOVERSION;  Surgeon: Josue Hector, MD;  Location: [place];  Service: Cardiovascular;  Laterality: N/A;   CARDIOVERSION N/A 10/19/2019   Procedure: CARDIOVERSION;  Surgeon: Jolaine Artist, MD;  Location: [facility] ENDOSCOPY;   Service: Cardiovascular;  Laterality: N/A;   CARDIOVERSION N/A 02/25/2020   Procedure: CARDIOVERSION;  Surgeon: Fay Records, MD;  Location: [place];  Service: Cardiovascular;  Laterality: N/A;   CARDIOVERSION N/A 05/25/2020   Procedure: CARDIOVERSION;  Surgeon: Lelon Perla, MD;  Location: [facility] ENDOSCOPY;  Service: Cardiovascular;  Laterality: N/A;   CARDIOVERSION N/A 07/10/2020   Procedure: CARDIOVERSION;  Surgeon: Jerline Pain, MD;  Location: [place];  Service: Cardiovascular;  Laterality: N/A;   CLIPPING OF ATRIAL APPENDAGE N/A 08/22/2015   Procedure: CLIPPING OF ATRIAL APPENDAGE;  Surgeon: Ivin Poot, MD;  Location: [place];  Service: Open Heart Surgery;  Laterality: N/A;   DOPPLER ECHOCARDIOGRAPHY  03/11/2002   EF 70-75%   FINGER SURGERY Left    Middle finger   FINGER TENDON REPAIR  1980's   "right little finger" (03/31/2012)   INSERT / REPLACE / REMOVE PACEMAKER     [employer]   IR RADIOLOGIST EVAL & MGMT  05/06/2019   IR RADIOLOGIST EVAL & MGMT  10/06/2019   IR RADIOLOGIST EVAL & MGMT  10/31/2020   IR RADIOLOGIST EVAL & MGMT  01/18/2021   IR RADIOLOGIST EVAL & MGMT  03/08/2021   KIDNEY SURGERY Left    LEFT AND RIGHT HEART CATHETERIZATION WITH CORONARY ANGIOGRAM N/A 10/27/2012   Procedure: LEFT AND RIGHT HEART CATHETERIZATION WITH CORONARY ANGIOGRAM;  Surgeon: Laverda Page, MD;  Location: [facility] CATH LAB;  Service: Cardiovascular;  Laterality: N/A;   LUMBAR DISC SURGERY  1980's X2;  2000's   MAZE N/A 08/22/2015   Procedure: MAZE;  Surgeon: Ivin Poot, MD;  Location: [place];  Service: Open Heart Surgery;  Laterality: N/A;   PACEMAKER INSERTION  03/31/2012   STJ Accent DR pacemaker implanted by Dr Rayann Heman   PERMANENT PACEMAKER INSERTION N/A 03/31/2012   Procedure: PERMANENT PACEMAKER INSERTION;  Surgeon: Thompson Grayer, MD;  Location: [facility] CATH LAB;  Service: Cardiovascular;  Laterality: N/A;   PPM GENERATOR CHANGEOUT N/A 08/08/2020   Procedure: PPM GENERATOR  CHANGEOUT;  Surgeon: Thompson Grayer, MD;  Location: [place] CV LAB;  Service: Cardiovascular;  Laterality: N/A;   RADIOLOGY WITH ANESTHESIA Left 02/14/2021   Procedure: RADIOLOGY WITH ANESTHESIA LEFT RENAL CRYOABLATION;  Surgeon: Aletta Edouard, MD;  Location: [facility];  Service: Radiology;  Laterality: Left;   TEE WITHOUT CARDIOVERSION  01/30/2012   Procedure: TRANSESOPHAGEAL ECHOCARDIOGRAM (TEE);  Surgeon: Larey Dresser, MD;  Location: [place];  Service: Cardiovascular;  Laterality: N/A;  ablation next day   TEE WITHOUT CARDIOVERSION N/A 08/22/2015   Procedure: TRANSESOPHAGEAL ECHOCARDIOGRAM (TEE);  Surgeon: Ivin Poot, MD;  Location: [place];  Service: Open Heart Surgery;  Laterality: N/A;   TEE WITHOUT CARDIOVERSION N/A 10/19/2019   Procedure: TRANSESOPHAGEAL ECHOCARDIOGRAM (TEE);  Surgeon: Jolaine Artist, MD;  Location: [facility] ENDOSCOPY;  Service: Cardiovascular;  Laterality: N/A;   US ECHOCARDIOGRAPHY  08/07/2009   EF 55-60%    Inpatient Medications: Scheduled Meds:  amLODipine  5 mg Oral Daily   apixaban  5 mg Oral BID   bisoprolol  5 mg Oral Daily   docusate sodium  100 mg Oral BID   fluticasone furoate-vilanterol  1 puff Inhalation Daily   And   umeclidinium bromide  1 puff Inhalation Daily   gabapentin  600 mg Oral q morning   And   gabapentin  1,200 mg Oral QHS   insulin aspart  0-15 Units Subcutaneous TID WC   insulin aspart  0-5 Units Subcutaneous QHS   ipratropium-albuterol  3 mL Nebulization BID   levothyroxine  175 mcg Oral QAC breakfast   montelukast  10 mg Oral QHS   oxyCODONE-acetaminophen  1 tablet Oral Q12H   pantoprazole  40 mg Oral Daily   predniSONE  2.5 mg Oral Q breakfast   rOPINIRole  1 mg Oral BID   rosuvastatin  10 mg Oral QHS   sodium chloride flush  3 mL Intravenous Q12H   spironolactone  25 mg Oral Daily   torsemide  30 mg Oral QODAY   traMADol  100 mg Oral BID   traZODone  50 mg Oral QHS   Continuous Infusions:  PRN  Meds: acetaminophen **OR** acetaminophen, albuterol, bisacodyl, dicyclomine, hydrALAZINE, LORazepam, ondansetron **OR** ondansetron (ZOFRAN) IV, polyethylene glycol  Allergies:    Allergies  Allergen Reactions   Meloxicam Rash and Other (See Comments)    Red Man Syndrome, also   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 syndrome 09/02/15, resolved with diphenhydramine and slowing of rate    Social History:   Social History   Socioeconomic History   Marital status: Married    Spouse name: Not on file   Number of children: Not on file   Years of education: Not on file   Highest education level: Not on file  Occupational History   Occupation: retired  Tobacco Use   Smoking status: Former    Packs/day: 1.00    Years: 25.00    Pack years: 25.00    Types: Cigarettes    Start date: 1963    Quit date: 07/18/1991    Years since quitting: 29.9   Smokeless tobacco: Never  Vaping Use   Vaping Use: Never used  Substance and Sexual Activity   Alcohol use: Not Currently    Comment: occasional   Drug use: Yes    Types: Marijuana    Comment: routine use   Sexual activity: Not Currently  Other Topics Concern   Not on file  Social History Narrative   Lives in [place] with spouse.  Works as a Air traffic controller.   Social Determinants of Health   Financial Resource Strain: Not on file  Food Insecurity: Not on file  Transportation Needs: Not on file  Physical Activity: Not on file  Stress: Not on file  Social Connections: Not on file  Intimate Partner Violence: Not on file    Family History:   Family History  Problem Relation Age of Onset   Heart failure Mother    Stroke Father      ROS:  Please see the history of present illness.  All other ROS reviewed and negative.     Physical Exam/Data:   Vitals:   06/10/21 1956 06/10/21 2057 06/11/21 0440 06/11/21 0854  BP:  (!) 128/56 131/77   Pulse:  60 66  Resp:  20 20   Temp:  (!) 97.3 F (36.3 C) 97.6 F (36.4  C)   TempSrc:  Oral Oral   SpO2: 98% 98% 98% 100%  Weight:      Height:       No intake or output data in the 24 hours ending 06/11/21 1316 Last 3 Weights 06/08/2021 06/07/2021 06/07/2021  Weight (lbs) 175 lb 0.7 oz 175 lb 175 lb 8 oz  Weight (kg) 79.4 kg 79.379 kg 79.606 kg     Body mass index is 24.41 kg/m.  General:  Well nourished, well developed, in no acute distress HEENT: normal Neck: no JVD Vascular: No carotid bruits; Distal pulses 2+ bilaterally Cardiac:  normal S1, S2; RRR; no murmur  Lungs:  clear to auscultation bilaterally, no wheezing, rhonchi or rales  Abd: soft, nontender, no hepatomegaly  Ext: no edema Musculoskeletal:  No deformities, BUE and BLE strength normal and equal Skin: warm and dry  Neuro:  CNs 2-12 intact, no focal abnormalities noted Psych:  Normal affect   EKG:  The EKG was personally reviewed and demonstrates: V paced rhythm Telemetry:  Telemetry was personally reviewed and demonstrates: Paced rhythm, PVC  Relevant CV Studies:  Echo 06/2020  1. Left ventricular ejection fraction, by estimation, is 55 to 60%. The  left ventricle has normal function. The left ventricle has no regional  wall motion abnormalities. Left ventricular diastolic parameters are  consistent with Grade III diastolic  dysfunction (restrictive).   2. Right ventricular systolic function is normal. The right ventricular  size is mildly enlarged. There is mildly elevated pulmonary artery  systolic pressure.   3. Left atrial size was moderately dilated.   4. Right atrial size was moderately dilated.   5. The mitral valve is normal in structure. Mild mitral valve  regurgitation.   6. TR signal insufficient to estimate RVSP accurately.   7. Normal functionin AoV bioprosthesis.      . The aortic valve has been repaired/replaced. There is mild  calcification of the aortic valve. Aortic valve regurgitation is not  visualized. There is a bioprosthetic valve present in the aortic  position.  Procedure Date: 08/22/2015.   8. Aortic dilatation noted. There is mild dilatation of the ascending  aorta, measuring 42 mm.   Cath 07/2015 Right/Left Heart Cath and Coronary Angiography   Conclusion  Mid RCA lesion, 10% stenosed. Ost Cx to Prox Cx lesion, 20% stenosed. The left ventricular systolic function is normal.   1. No significant CAD 2. Normal LV function 3. Severe aortic stenosis. Mean gradient 40 mm Hg. Index 0.58 4. Moderate pulmonary HTN with elevated LV filling pressures. 5. Normal cardiac output   Plan: referral to CT surgery for AVR.  Laboratory Data:  High Sensitivity Troponin:   Recent Labs  Lab 06/08/21 2059 06/08/21 2254  TROPONINIHS 44* 44*     Chemistry Recent Labs  Lab 06/08/21 2059 06/10/21 0302 06/11/21 0325  NA 138 138 136  K 4.2 3.5 3.9  CL 103 105 105  CO2 22 19* 23  GLUCOSE 115* 136* 113*  BUN 29* 39* 30*  CREATININE 1.89* 2.06* 1.58*  CALCIUM 10.2 9.3 8.9  MG  --   --  2.0  GFRNONAA 37* 34* 46*  ANIONGAP 13 14 8     Recent Labs  Lab 06/07/21 2300 06/10/21 0302 06/11/21 0325  PROT 8.0 6.6 6.1*  ALBUMIN 4.6 3.8 3.6  AST 24 24 21   ALT 20 18 18   ALKPHOS 76 58 53  BILITOT 1.4* 0.8 0.5   Hematology Recent Labs  Lab 06/08/21 2059 06/10/21 0302 06/11/21 0325  WBC 10.8* 10.9* 8.3  RBC 5.22 4.92 4.90  HGB 14.9 14.0 14.1  HCT 45.9 43.5 43.9  MCV 87.9 88.4 89.6  MCH 28.5 28.5 28.8  MCHC 32.5 32.2 32.1  RDW 16.0* 15.9* 15.7*  PLT 203 166 127*   Thyroid  Recent Labs  Lab 06/11/21 0325  TSH 0.160*    BNP Recent Labs  Lab 06/08/21 2309  BNP 322.7*    Radiology/Studies:  CT HEAD WO CONTRAST (5MM)  Result Date: 06/09/2021 CLINICAL DATA:  Delirium. EXAM: CT HEAD WITHOUT CONTRAST TECHNIQUE: Contiguous axial images were obtained from the base of the skull through the vertex without intravenous contrast. RADIATION DOSE REDUCTION: This exam was performed according to the departmental dose-optimization program  which includes automated exposure control, adjustment of the mA and/or kV according to patient size and/or use of iterative reconstruction technique. COMPARISON:  09/03/2015 FINDINGS: Brain: No evidence of acute infarction, hemorrhage, hydrocephalus, extra-axial collection or mass lesion/mass effect. There is mild diffuse low-attenuation within the subcortical and periventricular white matter compatible with chronic microvascular disease. Vascular: No hyperdense vessel or unexpected calcification. Skull: Normal. Negative for fracture or focal lesion. Sinuses/Orbits: Retention cyst or polyp is identified within the right maxillary sinus measuring 1 point 4 cm, image 81/4. Previously this measured 1.1 cm. Other: None. IMPRESSION: 1. No acute intracranial abnormalities. 2. Chronic small vessel ischemic disease. 3. Right maxillary sinus retention cyst or polyp. Electronically Signed   By: Kerby Moors M.D.   On: 06/09/2021 18:24   DG Chest Portable 1 View  Result Date: 06/08/2021 CLINICAL DATA:  Shortness of breath EXAM: PORTABLE CHEST 1 VIEW COMPARISON:  06/07/2021, 12/08/2020 FINDINGS: Post sternotomy changes. Left-sided pacing device and valve prosthesis as before. Atrial appendage clip. Borderline to mild cardiomegaly. No overt pulmonary edema. IMPRESSION: No active disease. Electronically Signed   By: Donavan Foil M.D.   On: 06/08/2021 23:34   ECHOCARDIOGRAM COMPLETE  Result Date: 06/10/2021    ECHOCARDIOGRAM REPORT   Patient Name:   [name] Date of Exam: 06/10/2021 Medical Rec #:  [id number]       Height:       71.0 in Accession #:    [id number]      Weight:       175.0 lb Date of Birth:  22-Oct-1950       BSA:          1.992 m Patient Age:    56 years        BP:           136/68 mmHg Patient Gender: M               HR:           63 bpm. Exam Location:  Inpatient Procedure: 2D Echo Indications:    chest pain  History:        Patient has prior history of Echocardiogram examinations, most                  recent 07/12/2020. CHF, Pacemaker, COPD and chronic kidney                 disease; Risk Factors:Hypertension.                 Aortic Valve: bioprosthetic valve is present in the aortic  position.  Sonographer:    Johny Chess RDCS Referring Phys: [id number] Georgina Quint LATIF [place]  1. Left ventricular ejection fraction, by estimation, is 55 to 60%. The left ventricle has normal function. The left ventricle has no regional wall motion abnormalities. Left ventricular diastolic parameters are indeterminate.  2. Right ventricular systolic function is normal. The right ventricular size is normal. The estimated right ventricular systolic pressure is 12.8 mmHg.  3. The mitral valve is normal in structure. Trivial mitral valve regurgitation. No evidence of mitral stenosis.  4. The aortic valve has been repaired/replaced. Aortic valve regurgitation is not visualized. No aortic stenosis is present. There is a bioprosthetic valve present in the aortic position. Aortic valve area, by VTI measures 1.63 cm. Aortic valve Vmax measures 2.28 m/s. Aortic valve mean gradient measures 11.0 mmHg. Aortic valve peak gradient measures 20.8 mmHg.  5. There is mild dilatation of the ascending aorta, measuring 38 mm.  6. The inferior vena cava is normal in size with greater than 50% respiratory variability, suggesting right atrial pressure of 3 mmHg.  7. Compared to echo 12/13/2019, the mean AVG has increased from 8 to 47mmhg. DVi has dcreased from 0.65 to 0.57 and AVA has decreased from 2.04cm2 to 1.63cm2. The AVA calculation is likely underestimated due to small LVOT measurement.  8. Left atrial size was mildly dilated. FINDINGS  Left Ventricle: Left ventricular ejection fraction, by estimation, is 55 to 60%. The left ventricle has normal function. The left ventricle has no regional wall motion abnormalities. The left ventricular internal cavity size was normal in size. There is  no left ventricular hypertrophy.  Left ventricular diastolic parameters are indeterminate. Normal left ventricular filling pressure. Right Ventricle: The right ventricular size is normal. No increase in right ventricular wall thickness. Right ventricular systolic function is normal. The tricuspid regurgitant velocity is 2.47 m/s, and with an assumed right atrial pressure of 8 mmHg, the estimated right ventricular systolic pressure is 78.6 mmHg. Left Atrium: Left atrial size was mildly dilated. Right Atrium: Right atrial size was normal in size. Pericardium: There is no evidence of pericardial effusion. Mitral Valve: The mitral valve is normal in structure. Mild mitral annular calcification. Trivial mitral valve regurgitation. No evidence of mitral valve stenosis. Tricuspid Valve: The tricuspid valve is normal in structure. Tricuspid valve regurgitation is trivial. No evidence of tricuspid stenosis. Aortic Valve: The aortic valve has been repaired/replaced. Aortic valve regurgitation is not visualized. No aortic stenosis is present. Aortic valve mean gradient measures 11.0 mmHg. Aortic valve peak gradient measures 20.8 mmHg. Aortic valve area, by VTI measures 1.63 cm. There is a bioprosthetic valve present in the aortic position. Pulmonic Valve: The pulmonic valve was normal in structure. Pulmonic valve regurgitation is not visualized. No evidence of pulmonic stenosis. Aorta: The aortic root is normal in size and structure. There is mild dilatation of the ascending aorta, measuring 38 mm. Venous: The inferior vena cava is normal in size with greater than 50% respiratory variability, suggesting right atrial pressure of 3 mmHg. IAS/Shunts: No atrial level shunt detected by color flow Doppler. Additional Comments: A device lead is visualized.  LEFT VENTRICLE PLAX 2D LVIDd:         4.90 cm   Diastology LVIDs:         3.30 cm   LV e' medial:    6.20 cm/s LV PW:         1.00 cm   LV E/e' medial:  17.7 LV IVS:  1.00 cm   LV e' lateral:   10.92 cm/s  LVOT diam:     1.90 cm   LV E/e' lateral: 10.1 LV SV:         75 LV SV Index:   38 LVOT Area:     2.84 cm  RIGHT VENTRICLE            IVC RV S prime:     9.68 cm/s  IVC diam: 2.20 cm TAPSE (M-mode): 1.4 cm LEFT ATRIUM             Index        RIGHT ATRIUM           Index LA diam:        4.00 cm 2.01 cm/m   RA Area:     20.50 cm LA Vol (A2C):   76.4 ml 38.34 ml/m  RA Volume:   62.10 ml  31.17 ml/m LA Vol (A4C):   64.3 ml 32.27 ml/m LA Biplane Vol: 72.5 ml 36.39 ml/m  AORTIC VALVE AV Area (Vmax):    1.71 cm AV Area (Vmean):   1.66 cm AV Area (VTI):     1.63 cm AV Vmax:           228.00 cm/s AV Vmean:          149.000 cm/s AV VTI:            0.461 m AV Peak Grad:      20.8 mmHg AV Mean Grad:      11.0 mmHg LVOT Vmax:         137.50 cm/s LVOT Vmean:        87.450 cm/s LVOT VTI:          0.265 m LVOT/AV VTI ratio: 0.57  AORTA Ao Asc diam: 3.80 cm MITRAL VALVE                TRICUSPID VALVE MV Area (PHT): 4.24 cm     TR Peak grad:   24.4 mmHg MV Decel Time: 179 msec     TR Vmax:        247.00 cm/s MV E velocity: 110.00 cm/s MV A velocity: 32.00 cm/s   SHUNTS MV E/A ratio:  3.44         Systemic VTI:  0.26 m                             Systemic Diam: 1.90 cm Fransico Him MD Electronically signed by Fransico Him MD Signature Date/Time: 06/10/2021/2:50:53 PM    Final    US Abdomen Limited RUQ (LIVER/GB)  Result Date: 06/08/2021 CLINICAL DATA:  Right upper quadrant abdominal pain EXAM: ULTRASOUND ABDOMEN LIMITED RIGHT UPPER QUADRANT COMPARISON:  CT abdomen/pelvis dated 06/07/2021 FINDINGS: Gallbladder: 6 mm gallstone within the gallbladder neck. No gallbladder wall thickening or pericholecystic fluid. Negative sonographic Murphy's sign. Common bile duct: Diameter: 4 mm Liver: No focal lesion identified. Within normal limits in parenchymal echogenicity. Portal vein is patent on color Doppler imaging with normal direction of blood flow towards the liver. Other: Right renal atrophy with increased parenchymal  echogenicity. IMPRESSION: Cholelithiasis, without associated sonographic findings to suggest acute cholecystitis. Electronically Signed   By: Julian Hy M.D.   On: 06/08/2021 00:52    Assessment and Plan:   Chest pain - Initially started with abdominal pain then 2 to 3 days history of intermittent sharp chest pain without associated symptoms.  EKG  without acute ischemic changes.  -Troponin flat at 44 x 2, consistent with ACS.   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 -Echocardiogram with preserved LV function without wall motion abnormality. -His chest pain is very atypical in nature.  No symptoms concerning of any angina.  Reassuring work-up as summarized above.  No inpatient ischemic work-up planned.  If he has recurrent symptoms will consider outpatient stress test.  2.  Acute on CKD stage III -Creatinine back to baseline  3.  S/p bioprosthetic AVR - Compared to echo 12/13/2019, the mean AVG has increased from 8 to  72mmhg. DVi has dcreased from 0.65 to 0.57 and AVA has decreased from  2.04cm2 to 1.63cm2. The AVA calculation is likely underestimated due to  small LVOT measurement.   4.  Chronic diastolic heart failure -Appears euvolemic on exam -Continue home medical regimen  5.  Persistent atrial fibrillation 6.  Complete heart block s/p pacemaker -V paced rhythm -Continue Eliquis for anticoagulation   Will sign off. Patient can be discharge on cardiac stand point. Has follow up with EP APP in 2 weeks.   Risk Assessment/Risk Scores:    New York Heart Association (NYHA) Functional Class NYHA Class II  CHA2DS2-VASc Score = 4  This indicates a 4.8% annual risk of stroke. The patient's score is based upon: CHF History: 1 HTN History: 1 Diabetes History: 1 Stroke History: 0 Vascular Disease History: 0 Age Score: 1 Gender Score: 0   For questions or updates, please contact [place] Please consult [email or web address] for contact info under     Jarrett Soho, PA  06/11/2021 1:16 PM    Patient seen and examined. Agree with assessment and plan.  [name] is a 71 year old gentleman who was admitted with abdominal pain associated with nausea.  He has noted some intermittent atypical sharp pain which would last seconds and is not associated with any activity.  He has a history of prior diastolic heart failure and is status post bioprosthetic aortic valve replacement in 2017 for aortic stenosis.  Prior to his AVR, catheterization did not reveal any significant obstructive disease he is status post permanent pacemaker for complete heart block and has persistent atrial fibrillation and has been on Eliquis for anticoagulation.  His most recent echo Doppler study during this admission shows improved LV function without wall motion abnormality and a well-seated aortic valve without significant prosthetic stenosis.  Of note,  RV pressures are now only minimally increased at 32, improved from 73 previously.  Presently he feels well.  Blood pressure is stable at 130/70.  Rhythm is paced with 100% capture and underlying atrial fibrillation.  HEENT is unremarkable.  He did not have carotid bruits.  There were no rales.  Rhythm is regular with 2/6 systolic murmur in the aortic position consistent with his bioprosthetic AVR.  Abdomen was nontender to palpation with positive bowel sounds.  He had brawny discoloration of both lower extremities suggestive of prior edema contributing to hemosiderin deposition in the soft tissue.  There is no edema presently.  Agree with recommendation above that we will sign off.  The patient has an EP follow-up appointment with Oda Kilts, PA and at that appointment since Dr. Rayann Heman is no longer consistently practicing he will be set up with a new EP primary physician.  From a cardiovascular standpoint, patient appears stable for discharge.  If recurrent symptomatology follow-up is concerning for  cardiovascular disease further work-up can be performed as outpatient.   Javohn Basey A.  Claiborne Billings, MD, [facility] 06/11/2021 2:12 PM

## 2021-06-11 NOTE — Progress Notes (Signed)
PT Cancellation Note  Patient Details Name: [name] MRN: [id number] DOB: [birth year]   Cancelled Treatment:    Reason Eval/Treat Not Completed: PT screened, no needs identified, will sign off (pt sitting up in room dressed independently and reports no difficulty with moving or breathign and denies need for any therapy at this time. will sign off)   [name] B [name] 06/11/2021, 8:12 AM [name], PT Acute Rehabilitation Services Pager: [phone] Office: [phone]

## 2021-06-11 NOTE — Progress Notes (Signed)
°  Transition of Care [facility]) Screening Note   Patient Details  Name: [name] Date of Birth: November 14, 1950   Transition of Care [facility]) CM/SW Contact:    Bartholomew Crews, RN Phone Number: [phone] 06/11/2021, 2:35 PM    Transition of Care Department [facility]) has reviewed patient and no TOC needs have been identified at this time.

## 2021-06-13 ENCOUNTER — Other Ambulatory Visit: Payer: Self-pay

## 2021-06-13 ENCOUNTER — Encounter: Payer: Self-pay | Admitting: *Deleted

## 2021-06-13 ENCOUNTER — Ambulatory Visit
Admission: RE | Admit: 2021-06-13 | Discharge: 2021-06-13 | Disposition: A | Discharge status: Home / Self Care | Payer: Medicare Other | Source: Ambulatory Visit | Attending: Interventional Radiology | Admitting: Interventional Radiology

## 2021-06-13 DIAGNOSIS — N2889 Other specified disorders of kidney and ureter: Secondary | ICD-10-CM

## 2021-06-13 HISTORY — PX: IR RADIOLOGIST EVAL & MGMT: IMG5224

## 2021-06-13 NOTE — Progress Notes (Signed)
Chief Complaint: Patient was consulted remotely today (TeleHealth) for follow up after cryoablation of a left renal carcinoma on 02/14/2021.  History of Present Illness: [name] is a 71 y.o. 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 He was doing well after the procedure but was admitted recently with chest and abdominal pain and discharged 2 days ago. Renal function had worsened while in the hospital but has improved and is now back to close to baseline. He denies any current pain or urinary symptoms.  Past Medical History:  Diagnosis Date   Aortic stenosis, mild    Arthritis of hand    "just a little bit in both hands" (03/31/2012)   Asthma    "little bit" (03/31/2012)   Atrial fibrillation ([place])    dx '04; DCCV '04, placed on flecainide, failed DCCV 04/2010, flecainide stopped; s/p successful A.fib ablation 01/31/12   CHF (congestive heart failure) ([place])    Controlled type 2 diabetes mellitus without complication, without long-term current use of insulin ([place]) 12/13/2019   COPD (chronic obstructive pulmonary disease) (H[facility])    DJD (degenerative joint disease)    Fibromyalgia    GERD (gastroesophageal reflux disease)    Hyperlipidemia    Hypertension    Hypothyroidism    S/P radiation   Left atrial enlargement    LA size 61mm by echo 11/21/11   Mitral regurgitation    trivial   Obstructive sleep apnea    mild by sleep study 2013; pt stated he does not have a machine because "it wasn't bad enough for him to have one"   Pacemaker 03/31/2012   Restless leg syndrome    Second degree AV block    Splenomegaly    Visit for monitoring Tikosyn therapy 04/16/2016    Past Surgical History:  Procedure Laterality Date   AORTIC VALVE REPLACEMENT N/A 08/22/2015   Procedure: AORTIC VALVE REPLACEMENT (AVR);  Surgeon: Ivin Poot, MD;  Location: [place];  Service: Open Heart Surgery;  Laterality: N/A;    ATRIAL FIBRILLATION ABLATION  01/30/2012   PVI by Dr. Rayann Heman   ATRIAL FIBRILLATION ABLATION N/A 01/31/2012   Procedure: ATRIAL FIBRILLATION ABLATION;  Surgeon: Thompson Grayer, MD;  Location: [facility] CATH LAB;  Service: Cardiovascular;  Laterality: N/A;   ATRIAL FIBRILLATION ABLATION N/A 04/19/2020   Procedure: ATRIAL FIBRILLATION ABLATION;  Surgeon: Thompson Grayer, MD;  Location: [place] CV LAB;  Service: Cardiovascular;  Laterality: N/A;   BACK SURGERY     X 3   CARDIAC CATHETERIZATION N/A 08/09/2015   Procedure: Right/Left Heart Cath and Coronary Angiography;  Surgeon: Peter M Martinique, MD;  Location: [place] CV LAB;  Service: Cardiovascular;  Laterality: N/A;   CARDIAC CATHETERIZATION N/A 02/05/2016   Procedure: Right/Left Heart Cath and Coronary Angiography;  Surgeon: Jettie Booze, MD;  Location: [place] CV LAB;  Service: Cardiovascular;  Laterality: N/A;   CARDIAC CATHETERIZATION N/A 04/17/2016   Procedure: Right Heart Cath;  Surgeon: Jolaine Artist, MD;  Location: [place] CV LAB;  Service: Cardiovascular;  Laterality: N/A;   CARDIOVASCULAR STRESS TEST  03/12/2002   EF 48%, NO EVIDENCE OF ISCHEMIA   CARDIOVERSION  01/2012; 03/31/2012   CARDIOVERSION N/A 02/25/2012   Procedure: CARDIOVERSION;  Surgeon: Thompson Grayer, MD;  Location: [facility] CATH LAB;  Service: Cardiovascular;  Laterality: N/A;   CARDIOVERSION N/A 03/31/2012   Procedure: CARDIOVERSION;  Surgeon: Thompson Grayer, MD;  Location: [facility] CATH LAB;  Service: Cardiovascular;  Laterality: N/A;   CARDIOVERSION N/A 11/23/2015   Procedure: CARDIOVERSION;  Surgeon: Larey Dresser, MD;  Location: [place];  Service: Cardiovascular;  Laterality: N/A;   CARDIOVERSION N/A 04/08/2016   Procedure: CARDIOVERSION;  Surgeon: Jolaine Artist, MD;  Location: [place];  Service: Cardiovascular;  Laterality: N/A;   CARDIOVERSION N/A 09/14/2018   Procedure: CARDIOVERSION;  Surgeon: Josue Hector, MD;  Location: [place];   Service: Cardiovascular;  Laterality: N/A;   CARDIOVERSION N/A 10/19/2019   Procedure: CARDIOVERSION;  Surgeon: Jolaine Artist, MD;  Location: [facility] ENDOSCOPY;  Service: Cardiovascular;  Laterality: N/A;   CARDIOVERSION N/A 02/25/2020   Procedure: CARDIOVERSION;  Surgeon: Fay Records, MD;  Location: [place];  Service: Cardiovascular;  Laterality: N/A;   CARDIOVERSION N/A 05/25/2020   Procedure: CARDIOVERSION;  Surgeon: Lelon Perla, MD;  Location: [facility] ENDOSCOPY;  Service: Cardiovascular;  Laterality: N/A;   CARDIOVERSION N/A 07/10/2020   Procedure: CARDIOVERSION;  Surgeon: Jerline Pain, MD;  Location: [place];  Service: Cardiovascular;  Laterality: N/A;   CLIPPING OF ATRIAL APPENDAGE N/A 08/22/2015   Procedure: CLIPPING OF ATRIAL APPENDAGE;  Surgeon: Ivin Poot, MD;  Location: [place];  Service: Open Heart Surgery;  Laterality: N/A;   DOPPLER ECHOCARDIOGRAPHY  03/11/2002   EF 70-75%   FINGER SURGERY Left    Middle finger   FINGER TENDON REPAIR  1980's   "right little finger" (03/31/2012)   INSERT / REPLACE / REMOVE PACEMAKER     [employer]   IR RADIOLOGIST EVAL & MGMT  05/06/2019   IR RADIOLOGIST EVAL & MGMT  10/06/2019   IR RADIOLOGIST EVAL & MGMT  10/31/2020   IR RADIOLOGIST EVAL & MGMT  01/18/2021   IR RADIOLOGIST EVAL & MGMT  03/08/2021   KIDNEY SURGERY Left    LEFT AND RIGHT HEART CATHETERIZATION WITH CORONARY ANGIOGRAM N/A 10/27/2012   Procedure: LEFT AND RIGHT HEART CATHETERIZATION WITH CORONARY ANGIOGRAM;  Surgeon: Laverda Page, MD;  Location: [facility] CATH LAB;  Service: Cardiovascular;  Laterality: N/A;   LUMBAR DISC SURGERY  1980's X2;  2000's   MAZE N/A 08/22/2015   Procedure: MAZE;  Surgeon: Ivin Poot, MD;  Location: [place];  Service: Open Heart Surgery;  Laterality: N/A;   PACEMAKER INSERTION  03/31/2012   STJ Accent DR pacemaker implanted by Dr Rayann Heman   PERMANENT PACEMAKER INSERTION N/A 03/31/2012   Procedure: PERMANENT PACEMAKER INSERTION;   Surgeon: Thompson Grayer, MD;  Location: [facility] CATH LAB;  Service: Cardiovascular;  Laterality: N/A;   PPM GENERATOR CHANGEOUT N/A 08/08/2020   Procedure: PPM GENERATOR CHANGEOUT;  Surgeon: Thompson Grayer, MD;  Location: [place] CV LAB;  Service: Cardiovascular;  Laterality: N/A;   RADIOLOGY WITH ANESTHESIA Left 02/14/2021   Procedure: RADIOLOGY WITH ANESTHESIA LEFT RENAL CRYOABLATION;  Surgeon: Aletta Edouard, MD;  Location: [facility];  Service: Radiology;  Laterality: Left;   TEE WITHOUT CARDIOVERSION  01/30/2012   Procedure: TRANSESOPHAGEAL ECHOCARDIOGRAM (TEE);  Surgeon: Larey Dresser, MD;  Location: [place];  Service: Cardiovascular;  Laterality: N/A;  ablation next day   TEE WITHOUT CARDIOVERSION N/A 08/22/2015   Procedure: TRANSESOPHAGEAL ECHOCARDIOGRAM (TEE);  Surgeon: Ivin Poot, MD;  Location: [place];  Service: Open Heart Surgery;  Laterality: N/A;   TEE WITHOUT CARDIOVERSION N/A 10/19/2019   Procedure: TRANSESOPHAGEAL ECHOCARDIOGRAM (TEE);  Surgeon: Jolaine Artist, MD;  Location: [facility] ENDOSCOPY;  Service: Cardiovascular;  Laterality: N/A;   US ECHOCARDIOGRAPHY  08/07/2009   EF 55-60%  Allergies: Meloxicam and Vancomycin  Medications: Prior to Admission medications   Medication Sig Start Date End Date Taking? Authorizing Provider  albuterol (PROVENTIL HFA;VENTOLIN HFA) 108 (90 Base) MCG/ACT inhaler Inhale 2 puffs into the lungs every 6 (six) hours as needed for wheezing or shortness of breath. 12/28/15   Collene Gobble, MD  amLODipine (NORVASC) 5 MG tablet Take 2.5 mg by mouth daily.    [provider]  apixaban (ELIQUIS) 5 MG TABS tablet Take 1 tablet (5 mg total) by mouth 2 (two) times daily. 05/11/21   Allred, Jeneen Rinks, MD  bisoprolol (ZEBETA) 5 MG tablet Take 1 tablet (5 mg total) by mouth daily. Needs appt Patient taking differently: Take 5 mg by mouth daily. 12/25/20   Bensimhon, Shaune Pascal, MD  Budeson-Glycopyrrol-Formoterol (BREZTRI AEROSPHERE) 160-9-4.8  MCG/ACT AERO Inhale 2 puffs into the lungs in the morning and at bedtime. 11/30/20   Martyn Ehrich, NP  Calcium-Magnesium-Zinc (CAL-MAG-ZINC PO) Take 1 tablet by mouth daily with breakfast.    [provider]  Cholecalciferol (VITAMIN D3 SUPER STRENGTH) 50 MCG (2000 UT) CAPS Take 8,000 Units by mouth at bedtime.    [provider]  dicyclomine (BENTYL) 10 MG capsule Take 1 capsule (10 mg total) by mouth every 6 (six) hours as needed (For abdominal cramping). 06/08/21   Molpus, John, MD  empagliflozin (JARDIANCE) 10 MG TABS tablet Take 1 tablet (10 mg total) by mouth daily before breakfast. Patient taking differently: Take 10 mg by mouth daily. 03/14/20   Bensimhon, Shaune Pascal, MD  fexofenadine (ALLEGRA) 180 MG tablet Take 1 tablet (180 mg total) by mouth daily as needed for allergies or rhinitis. 01/23/20   Aline August, MD  gabapentin (NEURONTIN) 300 MG capsule Take 600-1,200 mg by mouth See admin instructions. Take 600 mg by mouth in the morning, 1,200 mg at bedtime, and an additional 600 mg once a day as needed for neuropathy 09/13/14   [provider]  levothyroxine (SYNTHROID, LEVOTHROID) 175 MCG tablet Take 175 mcg by mouth daily before breakfast.    [provider]  LORazepam (ATIVAN) 1 MG tablet Take 1 tablet (1 mg total) by mouth every 8 (eight) hours as needed (nausea and/or restlessness). Patient taking differently: Take 1 mg by mouth at bedtime. 08/09/20   Carmin Muskrat, MD  Menthol-Camphor (TIGER BALM ARTHRITIS RUB EX) Apply 1 application topically as needed (for leg pain).    [provider]  montelukast (SINGULAIR) 10 MG tablet Take 1 tablet (10 mg total) by mouth at bedtime. 12/11/20   Parrett, Fonnie Mu, NP  Multiple Vitamin (MULTIVITAMIN WITH MINERALS) TABS tablet Take 1 tablet by mouth daily. 12/15/19   Sheikh, Georgina Quint Latif, DO  ondansetron (ZOFRAN-ODT) 4 MG disintegrating tablet Take 1 tablet (4 mg total) by mouth every 8 (eight) hours as  needed for nausea or vomiting. Patient taking differently: Take 4 mg by mouth every 8 (eight) hours as needed for nausea or vomiting (dissolve orally). 06/07/21   Fredia Sorrow, MD  oxyCODONE-acetaminophen (PERCOCET/ROXICET) 5-325 MG tablet Take 1 tablet by mouth 2 (two) times daily.    [provider]  OXYGEN Inhale 2 L/min into the lungs See admin instructions. 2 L/min at bedtime and 2 L/min throughout the day as needed for shortness of breath    [provider]  oxymetazoline (AFRIN) 0.05 % nasal spray Place 1 spray into both nostrils as needed (for nose bleeds).    [provider]  pantoprazole (PROTONIX) 40 MG tablet Take 40 mg  by mouth daily before breakfast.    [provider]  polyethylene glycol powder (GLYCOLAX/MIRALAX) 17 GM/SCOOP powder Take 17 g by mouth daily as needed for mild constipation. Patient not taking: Reported on 06/09/2021 01/23/20   Aline August, MD  potassium chloride SA (KLOR-CON) 20 MEQ tablet Take 20 mEq by mouth See admin instructions. Take 20 mEq by mouth two times a day on the days when Torsemide is taken    [provider]  predniSONE (DELTASONE) 5 MG tablet Take 1 tablet (5 mg total) by mouth daily with breakfast. Patient taking differently: Take 2.5 mg by mouth daily with breakfast. 07/19/20   Martyn Ehrich, NP  promethazine (PHENERGAN) 25 MG tablet Take 1 tablet (25 mg total) by mouth every 6 (six) hours as needed for nausea or vomiting. Patient not taking: Reported on 06/09/2021 12/10/20   Drenda Freeze, MD  rOPINIRole (REQUIP) 1 MG tablet Take 1 tablet (1 mg total) by mouth in the morning and at bedtime. 01/23/20   Aline August, MD  rosuvastatin (CRESTOR) 10 MG tablet Take 10 mg by mouth at bedtime.  08/08/16   [provider]  spironolactone (ALDACTONE) 25 MG tablet Take 1 tablet (25 mg total) by mouth daily. 02/16/16   Shirley Friar, PA-C  SYSTANE ULTRA PF 0.4-0.3 % SOLN Apply 1 drop to  eye 3 (three) times daily as needed (for dryness).    [provider]  torsemide (DEMADEX) 20 MG tablet Take 30 mg by mouth every other day.    [provider]  traMADol (ULTRAM) 50 MG tablet Take 100 mg by mouth 2 (two) times daily. 10/26/20   [provider]  traZODone (DESYREL) 50 MG tablet Take 50 mg by mouth at bedtime. 08/30/20   [provider]     Family History  Problem Relation Age of Onset   Heart failure Mother    Stroke Father     Social History   Socioeconomic History   Marital status: Married    Spouse name: Not on file   Number of children: Not on file   Years of education: Not on file   Highest education level: Not on file  Occupational History   Occupation: retired  Tobacco Use   Smoking status: Former    Packs/day: 1.00    Years: 25.00    Pack years: 25.00    Types: Cigarettes    Start date: 1963    Quit date: 07/18/1991    Years since quitting: 29.9   Smokeless tobacco: Never  Vaping Use   Vaping Use: Never used  Substance and Sexual Activity   Alcohol use: Not Currently    Comment: occasional   Drug use: Yes    Types: Marijuana    Comment: routine use   Sexual activity: Not Currently  Other Topics Concern   Not on file  Social History Narrative   Lives in [place] with spouse.  Works as a Air traffic controller.   Social Determinants of Health   Financial Resource Strain: Not on file  Food Insecurity: Not on file  Transportation Needs: Not on file  Physical Activity: Not on file  Stress: Not on file  Social Connections: Not on file    ECOG Status: 0 - Asymptomatic  Review of Systems  Constitutional:  Positive for fatigue.  Respiratory: Negative.    Cardiovascular: Negative.   Gastrointestinal: Negative.   Genitourinary: Negative.   Musculoskeletal: Negative.   Neurological: Negative.    Review of  Systems: A 12 point ROS discussed and pertinent positives are indicated in the HPI above.  All other  systems are negative.  Physical Exam No direct physical exam was performed (except for noted visual exam findings with Video Visits).    Vital Signs: There were no vitals taken for this visit.  Imaging: CT HEAD WO CONTRAST (5MM)  Result Date: 06/09/2021 CLINICAL DATA:  Delirium. EXAM: CT HEAD WITHOUT CONTRAST TECHNIQUE: Contiguous axial images were obtained from the base of the skull through the vertex without intravenous contrast. RADIATION DOSE REDUCTION: This exam was performed according to the departmental dose-optimization program which includes automated exposure control, adjustment of the mA and/or kV according to patient size and/or use of iterative reconstruction technique. COMPARISON:  09/03/2015 FINDINGS: Brain: No evidence of acute infarction, hemorrhage, hydrocephalus, extra-axial collection or mass lesion/mass effect. There is mild diffuse low-attenuation within the subcortical and periventricular white matter compatible with chronic microvascular disease. Vascular: No hyperdense vessel or unexpected calcification. Skull: Normal. Negative for fracture or focal lesion. Sinuses/Orbits: Retention cyst or polyp is identified within the right maxillary sinus measuring 1 point 4 cm, image 81/4. Previously this measured 1.1 cm. Other: None. IMPRESSION: 1. No acute intracranial abnormalities. 2. Chronic small vessel ischemic disease. 3. Right maxillary sinus retention cyst or polyp. Electronically Signed   By: Kerby Moors M.D.   On: 06/09/2021 18:24   MR CERVICAL SPINE WO CONTRAST  Result Date: 05/23/2021 CLINICAL DATA:  Chronic neck pain extending into the shoulders and arms with bilateral hand weakness. Chronic low back pain extending into both legs with numbness. History of back surgery x3. EXAM: MRI CERVICAL AND LUMBAR SPINE WITHOUT CONTRAST TECHNIQUE: Multiplanar and multiecho pulse sequences of the cervical spine, to include the craniocervical junction and cervicothoracic junction, and  lumbar spine, were obtained without intravenous contrast. COMPARISON:  No prior relevant imaging of the cervical spine. CT lumbar spine 12/10/2020. Abdominopelvic CT 08/09/2020. FINDINGS: Despite efforts by the technologist and patient, mild motion artifact is present on today's exam and could not be eliminated. This reduces exam sensitivity and specificity. MRI CERVICAL SPINE FINDINGS Alignment: Physiologic. Vertebrae: No acute or suspicious osseous findings. Cord: Normal in signal and caliber. Posterior Fossa, vertebral arteries, paraspinal tissues: Visualized portions of the posterior fossa appear unremarkable.Bilateral vertebral artery flow voids. No significant paraspinal findings. Disc levels: C2-3: The disc appears normal. Asymmetric facet hypertrophy on the left contributes to mild left foraminal narrowing. No cord deformity. C3-4: Mild disc bulging and uncinate spurring with asymmetric left-sided facet hypertrophy. No spinal stenosis. Mild left greater than right foraminal narrowing. C4-5: Mild loss of disc height with disc bulging and bilateral uncinate spurring. The CSF surrounding the cord is partially effaced without cord deformity. Moderate foraminal narrowing is present bilaterally with potential encroachment on either C5 nerve root. C5-6: Moderate loss of disc height with posterior osteophytes covering diffusely bulging disc material and bilateral uncinate spurring. The CSF surrounding the cord is partially effaced without cord deformity. Moderate foraminal narrowing bilaterally which could affect either C6 nerve root. C6-7: Mild disc bulging and uncinate spurring. Mild spinal stenosis without cord deformity. The foramina appear sufficiently patent. C7-T1: Small central disc protrusion and mild bilateral facet hypertrophy. No spinal stenosis or significant foraminal narrowing. MRI LUMBAR SPINE FINDINGS Segmentation: Conventional anatomy assumed, with the last open disc space designated  L5-S1.Concordant with previous imaging. Alignment:  Physiologic. Vertebrae: No worrisome osseous lesion, acute fracture or pars defect. The visualized sacroiliac joints appear unremarkable. Conus medullaris: Extends to the L1 level  and appears normal. Paraspinal and other soft tissues: No acute paraspinal abnormalities identified. There is aortic and branch vessel atherosclerosis. There is chronic right renal cortical scarring. Posteriorly in the interpolar region of the left kidney is a 2.0 cm T2 hypointense lesion (image 8/6) which appears similar in size to abdominopelvic CT 08/09/2020. This underwent biopsy under ultrasound 11/15/2020. There is prominent fatty atrophy of the erector spinae musculature bilaterally. Disc levels: No significant disc space findings from T11-12 through L2-3. L3-4: Stable disc bulging with endplate osteophytes asymmetric to the right and mild bilateral facet and ligamentous hypertrophy. Stable asymmetric mild narrowing of the right lateral recess and right foramen. L4-5: Left-sided laminectomy. Stable mild disc bulging and bilateral facet hypertrophy. Mild lateral recess narrowing bilaterally without nerve root encroachment. The foramina appear sufficiently patent. L5-S1: Probable postsurgical changes at this level. Mild disc bulging, facet and ligamentous hypertrophy with mild narrowing of the lateral recesses, similar to previous CT. The foramina appear sufficiently patent. IMPRESSION: 1. Multilevel cervical spondylosis as detailed above. The CSF surrounding the cord is effaced at C4-5 and C5-6 without cord deformity or abnormal cord signal. 2. Multilevel foraminal narrowing in the cervical spine which may affect the exiting nerve roots, greatest bilaterally at C4-5 and C5-6. 3. Mild lumbar spondylosis which appears similar to previous CT of 5 months ago. There is mild lateral recess narrowing without high-grade spinal stenosis or definite nerve root encroachment. 4. A previously  biopsied left renal lesion is grossly unchanged in size. Electronically Signed   By: Richardean Sale M.D.   On: 05/23/2021 16:27   MR LUMBAR SPINE WO CONTRAST  Result Date: 05/23/2021 CLINICAL DATA:  Chronic neck pain extending into the shoulders and arms with bilateral hand weakness. Chronic low back pain extending into both legs with numbness. History of back surgery x3. EXAM: MRI CERVICAL AND LUMBAR SPINE WITHOUT CONTRAST TECHNIQUE: Multiplanar and multiecho pulse sequences of the cervical spine, to include the craniocervical junction and cervicothoracic junction, and lumbar spine, were obtained without intravenous contrast. COMPARISON:  No prior relevant imaging of the cervical spine. CT lumbar spine 12/10/2020. Abdominopelvic CT 08/09/2020. FINDINGS: Despite efforts by the technologist and patient, mild motion artifact is present on today's exam and could not be eliminated. This reduces exam sensitivity and specificity. MRI CERVICAL SPINE FINDINGS Alignment: Physiologic. Vertebrae: No acute or suspicious osseous findings. Cord: Normal in signal and caliber. Posterior Fossa, vertebral arteries, paraspinal tissues: Visualized portions of the posterior fossa appear unremarkable.Bilateral vertebral artery flow voids. No significant paraspinal findings. Disc levels: C2-3: The disc appears normal. Asymmetric facet hypertrophy on the left contributes to mild left foraminal narrowing. No cord deformity. C3-4: Mild disc bulging and uncinate spurring with asymmetric left-sided facet hypertrophy. No spinal stenosis. Mild left greater than right foraminal narrowing. C4-5: Mild loss of disc height with disc bulging and bilateral uncinate spurring. The CSF surrounding the cord is partially effaced without cord deformity. Moderate foraminal narrowing is present bilaterally with potential encroachment on either C5 nerve root. C5-6: Moderate loss of disc height with posterior osteophytes covering diffusely bulging disc  material and bilateral uncinate spurring. The CSF surrounding the cord is partially effaced without cord deformity. Moderate foraminal narrowing bilaterally which could affect either C6 nerve root. C6-7: Mild disc bulging and uncinate spurring. Mild spinal stenosis without cord deformity. The foramina appear sufficiently patent. C7-T1: Small central disc protrusion and mild bilateral facet hypertrophy. No spinal stenosis or significant foraminal narrowing. MRI LUMBAR SPINE FINDINGS Segmentation: Conventional anatomy assumed, with the last  open disc space designated L5-S1.Concordant with previous imaging. Alignment:  Physiologic. Vertebrae: No worrisome osseous lesion, acute fracture or pars defect. The visualized sacroiliac joints appear unremarkable. Conus medullaris: Extends to the L1 level and appears normal. Paraspinal and other soft tissues: No acute paraspinal abnormalities identified. There is aortic and branch vessel atherosclerosis. There is chronic right renal cortical scarring. Posteriorly in the interpolar region of the left kidney is a 2.0 cm T2 hypointense lesion (image 8/6) which appears similar in size to abdominopelvic CT 08/09/2020. This underwent biopsy under ultrasound 11/15/2020. There is prominent fatty atrophy of the erector spinae musculature bilaterally. Disc levels: No significant disc space findings from T11-12 through L2-3. L3-4: Stable disc bulging with endplate osteophytes asymmetric to the right and mild bilateral facet and ligamentous hypertrophy. Stable asymmetric mild narrowing of the right lateral recess and right foramen. L4-5: Left-sided laminectomy. Stable mild disc bulging and bilateral facet hypertrophy. Mild lateral recess narrowing bilaterally without nerve root encroachment. The foramina appear sufficiently patent. L5-S1: Probable postsurgical changes at this level. Mild disc bulging, facet and ligamentous hypertrophy with mild narrowing of the lateral recesses, similar to  previous CT. The foramina appear sufficiently patent. IMPRESSION: 1. Multilevel cervical spondylosis as detailed above. The CSF surrounding the cord is effaced at C4-5 and C5-6 without cord deformity or abnormal cord signal. 2. Multilevel foraminal narrowing in the cervical spine which may affect the exiting nerve roots, greatest bilaterally at C4-5 and C5-6. 3. Mild lumbar spondylosis which appears similar to previous CT of 5 months ago. There is mild lateral recess narrowing without high-grade spinal stenosis or definite nerve root encroachment. 4. A previously biopsied left renal lesion is grossly unchanged in size. Electronically Signed   By: Richardean Sale M.D.   On: 05/23/2021 16:27   CT Abdomen Pelvis W Contrast  Result Date: 06/07/2021 CLINICAL DATA:  Abdominal pain with nausea and vomiting. 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 RADIATION DOSE REDUCTION: This exam was performed according to the departmental dose-optimization program which includes automated exposure control, adjustment of the mA and/or kV according to patient size and/or use of iterative reconstruction technique. CONTRAST:  58mL OMNIPAQUE IOHEXOL 300 MG/ML  SOLN COMPARISON:  08/09/2020, 02/14/2021 FINDINGS: Lower chest: No acute abnormality. Hepatobiliary: No focal liver abnormality is seen. Tiny stones again noted within the gallbladder. Gallbladder appears otherwise unremarkable. No biliary dilatation. Pancreas: Unremarkable. No pancreatic ductal dilatation or surrounding inflammatory changes. Spleen: Normal in size without focal abnormality. Adrenals/Urinary Tract: Unremarkable adrenal glands. Interval changes of cryo ablation involving the interpolar aspect of the left kidney at site of  previously seen enhancing mass. No new solid or enhancing component is evident on the current exam. Small cyst at the inferior pole. Chronic scarring in atrophy of the right kidney. No renal stone or hydronephrosis. Urinary bladder within normal limits. Stomach/Bowel: Stomach is within normal limits. Appendix appears normal. No evidence of bowel wall thickening, distention, or inflammatory changes. Vascular/Lymphatic: Aortic atherosclerosis. No enlarged abdominal or pelvic lymph nodes. Reproductive: Prostate is unremarkable. Other: Fat containing umbilical and left inguinal hernias. No ascites. No pneumoperitoneum. Musculoskeletal: No acute or significant osseous findings. IMPRESSION: 1. No acute abdominopelvic findings. 2. Interval changes of cryoablation involving the left kidney at site of previously seen enhancing mass. No new solid or enhancing component is evident on  the current exam. 3. Cholelithiasis without evidence of acute cholecystitis. 4. Fat containing umbilical and left inguinal hernias. Aortic Atherosclerosis (ICD10-I70.0). Electronically Signed   By: Davina Poke D.O.   On: 06/07/2021 09:23   DG Chest Portable 1 View  Result Date: 06/08/2021 CLINICAL DATA:  Shortness of breath EXAM: PORTABLE CHEST 1 VIEW COMPARISON:  06/07/2021, 12/08/2020 FINDINGS: Post sternotomy changes. Left-sided pacing device and valve prosthesis as before. Atrial appendage clip. Borderline to mild cardiomegaly. No overt pulmonary edema. IMPRESSION: No active disease. Electronically Signed   By: Donavan Foil M.D.   On: 06/08/2021 23:34   DG Chest Port 1 View  Result Date: 06/07/2021 CLINICAL DATA:  Chest pain, cough EXAM: PORTABLE CHEST 1 VIEW COMPARISON:  12/08/2020 FINDINGS: Transverse diameter of heart is within normal limits. There is prosthetic aortic valve. There is a metallic clamp in the region of left atrial appendage. Pacemaker battery is seen in the left infraclavicular region. There are no signs of  pulmonary edema or focal pulmonary consolidation. There is no pleural effusion or pneumothorax. IMPRESSION: No active disease. Electronically Signed   By: Elmer Picker M.D.   On: 06/07/2021 09:00   ECHOCARDIOGRAM COMPLETE  Result Date: 06/10/2021    ECHOCARDIOGRAM REPORT   Patient Name:   [name] Date of Exam: 06/10/2021 Medical Rec #:  [id number]       Height:       71.0 in Accession #:    [id number]      Weight:       175.0 lb Date of Birth:  03-30-1951       BSA:          1.992 m Patient Age:    75 years        BP:           136/68 mmHg Patient Gender: M               HR:           63 bpm. Exam Location:  Inpatient Procedure: 2D Echo Indications:    chest pain  History:        Patient has prior history of Echocardiogram examinations, most                 recent 07/12/2020. CHF, Pacemaker, COPD and chronic kidney                 disease; Risk Factors:Hypertension.                 Aortic Valve: bioprosthetic valve is present in the aortic                 position.  Sonographer:    Johny Chess RDCS Referring Phys: [id number] Georgina Quint LATIF [place]  1. Left ventricular ejection fraction, by estimation, is 55 to 60%. The left ventricle has normal function. The left ventricle has no regional wall motion abnormalities. Left ventricular diastolic parameters are indeterminate.  2. Right ventricular systolic function is normal. The right ventricular size is normal. The estimated right ventricular systolic pressure is 30.8 mmHg.  3. The mitral valve is normal in structure. Trivial mitral valve regurgitation. No evidence of mitral stenosis.  4. The aortic valve has been repaired/replaced. Aortic valve regurgitation is not visualized. No aortic stenosis is present. There is a bioprosthetic valve present in the aortic position. Aortic valve area, by VTI measures 1.63 cm. Aortic valve Vmax measures 2.28 m/s. Aortic valve mean gradient measures 11.0 mmHg. Aortic  valve peak gradient measures 20.8 mmHg.   5. There is mild dilatation of the ascending aorta, measuring 38 mm.  6. The inferior vena cava is normal in size with greater than 50% respiratory variability, suggesting right atrial pressure of 3 mmHg.  7. Compared to echo 12/13/2019, the mean AVG has increased from 8 to 34mmhg. DVi has dcreased from 0.65 to 0.57 and AVA has decreased from 2.04cm2 to 1.63cm2. The AVA calculation is likely underestimated due to small LVOT measurement.  8. Left atrial size was mildly dilated. FINDINGS  Left Ventricle: Left ventricular ejection fraction, by estimation, is 55 to 60%. The left ventricle has normal function. The left ventricle has no regional wall motion abnormalities. The left ventricular internal cavity size was normal in size. There is  no left ventricular hypertrophy. Left ventricular diastolic parameters are indeterminate. Normal left ventricular filling pressure. Right Ventricle: The right ventricular size is normal. No increase in right ventricular wall thickness. Right ventricular systolic function is normal. The tricuspid regurgitant velocity is 2.47 m/s, and with an assumed right atrial pressure of 8 mmHg, the estimated right ventricular systolic pressure is 58.0 mmHg. Left Atrium: Left atrial size was mildly dilated. Right Atrium: Right atrial size was normal in size. Pericardium: There is no evidence of pericardial effusion. Mitral Valve: The mitral valve is normal in structure. Mild mitral annular calcification. Trivial mitral valve regurgitation. No evidence of mitral valve stenosis. Tricuspid Valve: The tricuspid valve is normal in structure. Tricuspid valve regurgitation is trivial. No evidence of tricuspid stenosis. Aortic Valve: The aortic valve has been repaired/replaced. Aortic valve regurgitation is not visualized. No aortic stenosis is present. Aortic valve mean gradient measures 11.0 mmHg. Aortic valve peak gradient measures 20.8 mmHg. Aortic valve area, by VTI measures 1.63 cm. There is a  bioprosthetic valve present in the aortic position. Pulmonic Valve: The pulmonic valve was normal in structure. Pulmonic valve regurgitation is not visualized. No evidence of pulmonic stenosis. Aorta: The aortic root is normal in size and structure. There is mild dilatation of the ascending aorta, measuring 38 mm. Venous: The inferior vena cava is normal in size with greater than 50% respiratory variability, suggesting right atrial pressure of 3 mmHg. IAS/Shunts: No atrial level shunt detected by color flow Doppler. Additional Comments: A device lead is visualized.  LEFT VENTRICLE PLAX 2D LVIDd:         4.90 cm   Diastology LVIDs:         3.30 cm   LV e' medial:    6.20 cm/s LV PW:         1.00 cm   LV E/e' medial:  17.7 LV IVS:        1.00 cm   LV e' lateral:   10.92 cm/s LVOT diam:     1.90 cm   LV E/e' lateral: 10.1 LV SV:         75 LV SV Index:   38 LVOT Area:     2.84 cm  RIGHT VENTRICLE            IVC RV S prime:     9.68 cm/s  IVC diam: 2.20 cm TAPSE (M-mode): 1.4 cm LEFT ATRIUM             Index        RIGHT ATRIUM           Index LA diam:        4.00 cm 2.01 cm/m   RA Area:  20.50 cm LA Vol (A2C):   76.4 ml 38.34 ml/m  RA Volume:   62.10 ml  31.17 ml/m LA Vol (A4C):   64.3 ml 32.27 ml/m LA Biplane Vol: 72.5 ml 36.39 ml/m  AORTIC VALVE AV Area (Vmax):    1.71 cm AV Area (Vmean):   1.66 cm AV Area (VTI):     1.63 cm AV Vmax:           228.00 cm/s AV Vmean:          149.000 cm/s AV VTI:            0.461 m AV Peak Grad:      20.8 mmHg AV Mean Grad:      11.0 mmHg LVOT Vmax:         137.50 cm/s LVOT Vmean:        87.450 cm/s LVOT VTI:          0.265 m LVOT/AV VTI ratio: 0.57  AORTA Ao Asc diam: 3.80 cm MITRAL VALVE                TRICUSPID VALVE MV Area (PHT): 4.24 cm     TR Peak grad:   24.4 mmHg MV Decel Time: 179 msec     TR Vmax:        247.00 cm/s MV E velocity: 110.00 cm/s MV A velocity: 32.00 cm/s   SHUNTS MV E/A ratio:  3.44         Systemic VTI:  0.26 m                              Systemic Diam: 1.90 cm Fransico Him MD Electronically signed by Fransico Him MD Signature Date/Time: 06/10/2021/2:50:53 PM    Final    US Abdomen Limited RUQ (LIVER/GB)  Result Date: 06/08/2021 CLINICAL DATA:  Right upper quadrant abdominal pain EXAM: ULTRASOUND ABDOMEN LIMITED RIGHT UPPER QUADRANT COMPARISON:  CT abdomen/pelvis dated 06/07/2021 FINDINGS: Gallbladder: 6 mm gallstone within the gallbladder neck. No gallbladder wall thickening or pericholecystic fluid. Negative sonographic Murphy's sign. Common bile duct: Diameter: 4 mm Liver: No focal lesion identified. Within normal limits in parenchymal echogenicity. Portal vein is patent on color Doppler imaging with normal direction of blood flow towards the liver. Other: Right renal atrophy with increased parenchymal echogenicity. IMPRESSION: Cholelithiasis, without associated sonographic findings to suggest acute cholecystitis. Electronically Signed   By: Julian Hy M.D.   On: 06/08/2021 00:52    Labs:  CBC: Recent Labs    06/07/21 2300 06/08/21 2059 06/10/21 0302 06/11/21 0325  WBC 9.7 10.8* 10.9* 8.3  HGB 15.4 14.9 14.0 14.1  HCT 47.5 45.9 43.5 43.9  PLT 190 203 166 127*    COAGS: Recent Labs    11/15/20 0621 02/14/21 1023  INR 1.2 1.1  APTT 34  --     BMP: Recent Labs    06/07/21 2300 06/08/21 2059 06/10/21 0302 06/11/21 0325  NA 138 138 138 136  K 4.5 4.2 3.5 3.9  CL 100 103 105 105  CO2 25 22 19* 23  GLUCOSE 125* 115* 136* 113*  BUN 26* 29* 39* 30*  CALCIUM 10.0 10.2 9.3 8.9  CREATININE 1.85* 1.89* 2.06* 1.58*  GFRNONAA 38* 37* 34* 46*    LIVER FUNCTION TESTS: Recent Labs    06/07/21 0804 06/07/21 2300 06/10/21 0302 06/11/21 0325  BILITOT 1.3* 1.4* 0.8 0.5  AST 27 24 24 21   ALT 23 20  18 18  ALKPHOS 78 76 58 53  PROT 8.0 8.0 6.6 6.1*  ALBUMIN 4.6 4.6 3.8 3.6    Assessment and Plan:  I spoke with [name] and his wife over the phone. A CT of the abdomen and pelvis was performed on  06/07/2021 with contrast just prior to his recent hospital admission.  This demonstrates an expected ablation defect at the level of the ablated left posterior interpolar renal carcinoma with no evidence of abnormal enhancement to suggest residual viable carcinoma at this time.  There is no evidence of complication following the procedure.  I recommended a follow-up CT of the abdomen with and without contrast in 1 year.  We will check his renal function prior to contrast administration at that time.  Electronically Signed: Azzie Roup 06/13/2021, 9:17 AM    I spent a total of 10 Minutes in remote  clinical consultation, greater than 50% of which was counseling/coordinating care post ablation of a left renal carcinoma.    Visit type: Audio only (telephone). Audio (no video) only due to patient's lack of internet/smartphone capability. Alternative for in-person consultation at [facility], [address] [place], [place], [place]. This visit type was conducted due to national recommendations for restrictions regarding the COVID-19 Pandemic (e.g. social distancing).  This format is felt to be most appropriate for this patient at this time.  All issues noted in this document were discussed and addressed.

## 2021-06-22 ENCOUNTER — Other Ambulatory Visit: Payer: Self-pay | Admitting: Adult Health

## 2021-06-22 NOTE — Telephone Encounter (Signed)
Please advise patient requesting refill °

## 2021-06-25 ENCOUNTER — Other Ambulatory Visit: Payer: Self-pay

## 2021-06-25 ENCOUNTER — Encounter: Payer: Self-pay | Admitting: Student

## 2021-06-25 ENCOUNTER — Ambulatory Visit: Payer: Medicare Other | Admitting: Student

## 2021-06-25 VITALS — BP 122/70 | HR 64 | Ht 71.0 in | Wt 193.0 lb

## 2021-06-25 DIAGNOSIS — R0602 Shortness of breath: Secondary | ICD-10-CM

## 2021-06-25 DIAGNOSIS — I484 Atypical atrial flutter: Secondary | ICD-10-CM | POA: Diagnosis not present

## 2021-06-25 DIAGNOSIS — E039 Hypothyroidism, unspecified: Secondary | ICD-10-CM

## 2021-06-25 DIAGNOSIS — I442 Atrioventricular block, complete: Secondary | ICD-10-CM | POA: Diagnosis not present

## 2021-06-25 DIAGNOSIS — I4819 Other persistent atrial fibrillation: Secondary | ICD-10-CM

## 2021-06-25 DIAGNOSIS — I5032 Chronic diastolic (congestive) heart failure: Secondary | ICD-10-CM

## 2021-06-25 LAB — CUP PACEART INCLINIC DEVICE CHECK
Battery Remaining Longevity: 127 mo
Battery Voltage: 3.02 V
Brady Statistic RA Percent Paced: 0 %
Brady Statistic RV Percent Paced: 99.86 %
Date Time Interrogation Session: 20230227114425
Implantable Lead Implant Date: 20131203
Implantable Lead Implant Date: 20131203
Implantable Lead Location: 753859
Implantable Lead Location: 753860
Implantable Lead Model: 1948
Implantable Pulse Generator Implant Date: 20220412
Lead Channel Impedance Value: 675 Ohm
Lead Channel Pacing Threshold Amplitude: 0.875 V
Lead Channel Pacing Threshold Pulse Width: 0.5 ms
Lead Channel Sensing Intrinsic Amplitude: 8.3 mV
Lead Channel Setting Pacing Amplitude: 1.125
Lead Channel Setting Pacing Pulse Width: 0.5 ms
Lead Channel Setting Sensing Sensitivity: 4 mV
Pulse Gen Model: 2272
Pulse Gen Serial Number: 3919039

## 2021-06-25 LAB — BASIC METABOLIC PANEL
BUN/Creatinine Ratio: 16 (ref 10–24)
BUN: 29 mg/dL — ABNORMAL HIGH (ref 8–27)
CO2: 25 mmol/L (ref 20–29)
Calcium: 9.5 mg/dL (ref 8.6–10.2)
Chloride: 94 mmol/L — ABNORMAL LOW (ref 96–106)
Creatinine, Ser: 1.87 mg/dL — ABNORMAL HIGH (ref 0.76–1.27)
Glucose: 199 mg/dL — ABNORMAL HIGH (ref 70–99)
Potassium: 4.5 mmol/L (ref 3.5–5.2)
Sodium: 135 mmol/L (ref 134–144)
eGFR: 38 mL/min/{1.73_m2} — ABNORMAL LOW (ref >59)

## 2021-06-25 LAB — CBC
Hematocrit: 43.2 % (ref 37.5–51.0)
Hemoglobin: 14.6 g/dL (ref 13.0–17.7)
MCH: 29.6 pg (ref 26.6–33.0)
MCHC: 33.8 g/dL (ref 31.5–35.7)
MCV: 87 fL (ref 79–97)
Platelets: 163 10*3/uL (ref 150–450)
RBC: 4.94 x10E6/uL (ref 4.14–5.80)
RDW: 14.9 % (ref 11.6–15.4)
WBC: 12.4 10*3/uL — ABNORMAL HIGH (ref 3.4–10.8)

## 2021-06-25 LAB — TSH: TSH: 5.14 u[IU]/mL — ABNORMAL HIGH (ref 0.450–4.500)

## 2021-06-25 NOTE — Progress Notes (Signed)
Electrophysiology Office Note Date: 06/25/2021  ID:  [name], DOB [birth year], MRN [id number]  PCP: Victor Hatchet, MD Primary Cardiologist: None Electrophysiologist: Victor Grayer, MD   CC: Pacemaker follow-up  [name] is a 71 y.o. male seen today for Victor Grayer, MD for post hospital follow up.    Admitted to for chest pain/observation 2/10 - 2/13. Echo showed EF 55-60%. He did have some acute metabolic encephalopathy and acute on chronic diastolic CHF. Troponins were flat. PT/OT followed. He was alos noted to be hypoxic on admission, which may have contributed to his encephalopathy.  Since discharge from hospital the patient reports doing well. He .  he denies chest pain, palpitations, dyspnea, PND, orthopnea, nausea, vomiting, dizziness, syncope, edema, weight gain, or early satiety.  Device History: St. Jude Dual Chamber PPM implanted 03/2012, gen change 07/2020 for CHB/Permanent AF  Past Medical History:  Diagnosis Date   Aortic stenosis, mild    Arthritis of hand    "just a little bit in both hands" (03/31/2012)   Asthma    "little bit" (03/31/2012)   Atrial fibrillation ([place])    dx '04; DCCV '04, placed on flecainide, failed DCCV 04/2010, flecainide stopped; s/p successful A.fib ablation 01/31/12   CHF (congestive heart failure) ([place])    Controlled type 2 diabetes mellitus without complication, without long-term current use of insulin ([place]) 12/13/2019   COPD (chronic obstructive pulmonary disease) ([facility])    DJD (degenerative joint disease)    Fibromyalgia    GERD (gastroesophageal reflux disease)    Hyperlipidemia    Hypertension    Hypothyroidism    S/P radiation   Left atrial enlargement    LA size 56mm by echo 11/21/11   Mitral regurgitation    trivial   Obstructive sleep apnea    mild by sleep study 2013; pt stated he does not have a machine because "it wasn't bad enough for him to have one"   Pacemaker 03/31/2012   Restless leg syndrome    Second  degree AV block    Splenomegaly    Visit for monitoring Tikosyn therapy 04/16/2016   Past Surgical History:  Procedure Laterality Date   AORTIC VALVE REPLACEMENT N/A 08/22/2015   Procedure: AORTIC VALVE REPLACEMENT (AVR);  Surgeon: Ivin Poot, MD;  Location: [place];  Service: Open Heart Surgery;  Laterality: N/A;   ATRIAL FIBRILLATION ABLATION  01/30/2012   PVI by Dr. Rayann Heman   ATRIAL FIBRILLATION ABLATION N/A 01/31/2012   Procedure: ATRIAL FIBRILLATION ABLATION;  Surgeon: Victor Grayer, MD;  Location: [facility] CATH LAB;  Service: Cardiovascular;  Laterality: N/A;   ATRIAL FIBRILLATION ABLATION N/A 04/19/2020   Procedure: ATRIAL FIBRILLATION ABLATION;  Surgeon: Victor Grayer, MD;  Location: [place] CV LAB;  Service: Cardiovascular;  Laterality: N/A;   BACK SURGERY     X 3   CARDIAC CATHETERIZATION N/A 08/09/2015   Procedure: Right/Left Heart Cath and Coronary Angiography;  Surgeon: Peter M Martinique, MD;  Location: [place] CV LAB;  Service: Cardiovascular;  Laterality: N/A;   CARDIAC CATHETERIZATION N/A 02/05/2016   Procedure: Right/Left Heart Cath and Coronary Angiography;  Surgeon: Jettie Booze, MD;  Location: [place] CV LAB;  Service: Cardiovascular;  Laterality: N/A;   CARDIAC CATHETERIZATION N/A 04/17/2016   Procedure: Right Heart Cath;  Surgeon: Jolaine Artist, MD;  Location: [place] CV LAB;  Service: Cardiovascular;  Laterality: N/A;   CARDIOVASCULAR STRESS TEST  03/12/2002   EF 48%, NO EVIDENCE OF ISCHEMIA  CARDIOVERSION  01/2012; 03/31/2012   CARDIOVERSION N/A 02/25/2012   Procedure: CARDIOVERSION;  Surgeon: Victor Grayer, MD;  Location: [facility] CATH LAB;  Service: Cardiovascular;  Laterality: N/A;   CARDIOVERSION N/A 03/31/2012   Procedure: CARDIOVERSION;  Surgeon: Victor Grayer, MD;  Location: [facility] CATH LAB;  Service: Cardiovascular;  Laterality: N/A;   CARDIOVERSION N/A 11/23/2015   Procedure: CARDIOVERSION;  Surgeon: Larey Dresser, MD;  Location: [place];   Service: Cardiovascular;  Laterality: N/A;   CARDIOVERSION N/A 04/08/2016   Procedure: CARDIOVERSION;  Surgeon: Jolaine Artist, MD;  Location: [place];  Service: Cardiovascular;  Laterality: N/A;   CARDIOVERSION N/A 09/14/2018   Procedure: CARDIOVERSION;  Surgeon: Josue Hector, MD;  Location: [place];  Service: Cardiovascular;  Laterality: N/A;   CARDIOVERSION N/A 10/19/2019   Procedure: CARDIOVERSION;  Surgeon: Jolaine Artist, MD;  Location: [facility] ENDOSCOPY;  Service: Cardiovascular;  Laterality: N/A;   CARDIOVERSION N/A 02/25/2020   Procedure: CARDIOVERSION;  Surgeon: Fay Records, MD;  Location: [place];  Service: Cardiovascular;  Laterality: N/A;   CARDIOVERSION N/A 05/25/2020   Procedure: CARDIOVERSION;  Surgeon: Lelon Perla, MD;  Location: [facility] ENDOSCOPY;  Service: Cardiovascular;  Laterality: N/A;   CARDIOVERSION N/A 07/10/2020   Procedure: CARDIOVERSION;  Surgeon: Jerline Pain, MD;  Location: [place];  Service: Cardiovascular;  Laterality: N/A;   CLIPPING OF ATRIAL APPENDAGE N/A 08/22/2015   Procedure: CLIPPING OF ATRIAL APPENDAGE;  Surgeon: Ivin Poot, MD;  Location: [place];  Service: Open Heart Surgery;  Laterality: N/A;   DOPPLER ECHOCARDIOGRAPHY  03/11/2002   EF 70-75%   FINGER SURGERY Left    Middle finger   FINGER TENDON REPAIR  1980's   "right little finger" (03/31/2012)   INSERT / REPLACE / REMOVE PACEMAKER     St Jude   IR RADIOLOGIST EVAL & MGMT  05/06/2019   IR RADIOLOGIST EVAL & MGMT  10/06/2019   IR RADIOLOGIST EVAL & MGMT  10/31/2020   IR RADIOLOGIST EVAL & MGMT  01/18/2021   IR RADIOLOGIST EVAL & MGMT  03/08/2021   IR RADIOLOGIST EVAL & MGMT  06/13/2021   KIDNEY SURGERY Left    LEFT AND RIGHT HEART CATHETERIZATION WITH CORONARY ANGIOGRAM N/A 10/27/2012   Procedure: LEFT AND RIGHT HEART CATHETERIZATION WITH CORONARY ANGIOGRAM;  Surgeon: Laverda Page, MD;  Location: [facility] CATH LAB;  Service: Cardiovascular;  Laterality: N/A;    LUMBAR DISC SURGERY  1980's X2;  2000's   MAZE N/A 08/22/2015   Procedure: MAZE;  Surgeon: Ivin Poot, MD;  Location: [place];  Service: Open Heart Surgery;  Laterality: N/A;   PACEMAKER INSERTION  03/31/2012   STJ Accent DR pacemaker implanted by Dr Rayann Heman   PERMANENT PACEMAKER INSERTION N/A 03/31/2012   Procedure: PERMANENT PACEMAKER INSERTION;  Surgeon: Victor Grayer, MD;  Location: [facility] CATH LAB;  Service: Cardiovascular;  Laterality: N/A;   PPM GENERATOR CHANGEOUT N/A 08/08/2020   Procedure: PPM GENERATOR CHANGEOUT;  Surgeon: Victor Grayer, MD;  Location: [place] CV LAB;  Service: Cardiovascular;  Laterality: N/A;   RADIOLOGY WITH ANESTHESIA Left 02/14/2021   Procedure: RADIOLOGY WITH ANESTHESIA LEFT RENAL CRYOABLATION;  Surgeon: Aletta Edouard, MD;  Location: [facility];  Service: Radiology;  Laterality: Left;   TEE WITHOUT CARDIOVERSION  01/30/2012   Procedure: TRANSESOPHAGEAL ECHOCARDIOGRAM (TEE);  Surgeon: Larey Dresser, MD;  Location: [place];  Service: Cardiovascular;  Laterality: N/A;  ablation next day   TEE WITHOUT CARDIOVERSION N/A 08/22/2015   Procedure: TRANSESOPHAGEAL ECHOCARDIOGRAM (TEE);  Surgeon: Ivin Poot, MD;  Location: [place];  Service: Open Heart Surgery;  Laterality: N/A;   TEE WITHOUT CARDIOVERSION N/A 10/19/2019   Procedure: TRANSESOPHAGEAL ECHOCARDIOGRAM (TEE);  Surgeon: Jolaine Artist, MD;  Location: [facility] ENDOSCOPY;  Service: Cardiovascular;  Laterality: N/A;   US ECHOCARDIOGRAPHY  08/07/2009   EF 55-60%    Current Outpatient Medications  Medication Sig Dispense Refill   albuterol (PROVENTIL HFA;VENTOLIN HFA) 108 (90 Base) MCG/ACT inhaler Inhale 2 puffs into the lungs every 6 (six) hours as needed for wheezing or shortness of breath. 1 Inhaler 5   amLODipine (NORVASC) 5 MG tablet Take 2.5 mg by mouth daily.     apixaban (ELIQUIS) 5 MG TABS tablet Take 1 tablet (5 mg total) by mouth 2 (two) times daily. 60 tablet 6   bisoprolol (ZEBETA) 5 MG  tablet Take 1 tablet (5 mg total) by mouth daily. Needs appt (Patient taking differently: Take 5 mg by mouth daily.) 90 tablet 0   Budeson-Glycopyrrol-Formoterol (BREZTRI AEROSPHERE) 160-9-4.8 MCG/ACT AERO Inhale 2 puffs into the lungs in the morning and at bedtime. 32.1 g 3   Calcium-Magnesium-Zinc (CAL-MAG-ZINC PO) Take 1 tablet by mouth daily with breakfast.     Cholecalciferol (VITAMIN D3 SUPER STRENGTH) 50 MCG (2000 UT) CAPS Take 8,000 Units by mouth at bedtime.     dicyclomine (BENTYL) 10 MG capsule Take 1 capsule (10 mg total) by mouth every 6 (six) hours as needed (For abdominal cramping). 20 capsule 0   empagliflozin (JARDIANCE) 10 MG TABS tablet Take 1 tablet (10 mg total) by mouth daily before breakfast. (Patient taking differently: Take 10 mg by mouth daily.) 30 tablet 6   fexofenadine (ALLEGRA) 180 MG tablet Take 1 tablet (180 mg total) by mouth daily as needed for allergies or rhinitis.     gabapentin (NEURONTIN) 300 MG capsule Take 600-1,200 mg by mouth See admin instructions. Take 600 mg by mouth in the morning, 1,200 mg at bedtime, and an additional 600 mg once a day as needed for neuropathy  3   levothyroxine (SYNTHROID, LEVOTHROID) 175 MCG tablet Take 175 mcg by mouth daily before breakfast.     LORazepam (ATIVAN) 1 MG tablet Take 1 tablet (1 mg total) by mouth every 8 (eight) hours as needed (nausea and/or restlessness). (Patient taking differently: Take 1 mg by mouth at bedtime.) 15 tablet 0   Menthol-Camphor (TIGER BALM ARTHRITIS RUB EX) Apply 1 application topically as needed (for leg pain).     montelukast (SINGULAIR) 10 MG tablet Take 1 tablet (10 mg total) by mouth at bedtime. 90 tablet 3   Multiple Vitamin (MULTIVITAMIN WITH MINERALS) TABS tablet Take 1 tablet by mouth daily. 30 tablet 0   ondansetron (ZOFRAN-ODT) 4 MG disintegrating tablet Take 1 tablet (4 mg total) by mouth every 8 (eight) hours as needed for nausea or vomiting. (Patient taking differently: Take 4 mg by  mouth every 8 (eight) hours as needed for nausea or vomiting (dissolve orally).) 12 tablet 1   oxyCODONE-acetaminophen (PERCOCET/ROXICET) 5-325 MG tablet Take 1 tablet by mouth 2 (two) times daily.     OXYGEN Inhale 2 L/min into the lungs See admin instructions. 2 L/min at bedtime and 2 L/min throughout the day as needed for shortness of breath     oxymetazoline (AFRIN) 0.05 % nasal spray Place 1 spray into both nostrils as needed (for nose bleeds).     pantoprazole (PROTONIX) 40 MG tablet Take 40 mg by mouth daily before breakfast.  polyethylene glycol powder (GLYCOLAX/MIRALAX) 17 GM/SCOOP powder Take 17 g by mouth daily as needed for mild constipation.     potassium chloride SA (KLOR-CON) 20 MEQ tablet Take 20 mEq by mouth See admin instructions. Take 20 mEq by mouth two times a day on the days when Torsemide is taken     predniSONE (DELTASONE) 2.5 MG tablet Take 2.5 mg by mouth daily with breakfast.     PREDNISONE PO Taper as directed     promethazine (PHENERGAN) 25 MG tablet Take 1 tablet (25 mg total) by mouth every 6 (six) hours as needed for nausea or vomiting. 15 tablet 0   rOPINIRole (REQUIP) 1 MG tablet Take 1 tablet (1 mg total) by mouth in the morning and at bedtime.     rosuvastatin (CRESTOR) 10 MG tablet Take 10 mg by mouth at bedtime.      spironolactone (ALDACTONE) 25 MG tablet Take 1 tablet (25 mg total) by mouth daily. 90 tablet 3   SYSTANE ULTRA PF 0.4-0.3 % SOLN Apply 1 drop to eye 3 (three) times daily as needed (for dryness).     torsemide (DEMADEX) 20 MG tablet Take 30 mg by mouth every other day.     traMADol (ULTRAM) 50 MG tablet Take 100 mg by mouth 2 (two) times daily.     traZODone (DESYREL) 50 MG tablet Take 50 mg by mouth at bedtime.     No current facility-administered medications for this visit.    Allergies:   Meloxicam and Vancomycin   Social History: Social History   Socioeconomic History   Marital status: Married    Spouse name: Not on file   Number  of children: Not on file   Years of education: Not on file   Highest education level: Not on file  Occupational History   Occupation: retired  Tobacco Use   Smoking status: Former    Packs/day: 1.00    Years: 25.00    Pack years: 25.00    Types: Cigarettes    Start date: 1963    Quit date: 07/18/1991    Years since quitting: 29.9    Passive exposure: Never   Smokeless tobacco: Never  Vaping Use   Vaping Use: Never used  Substance and Sexual Activity   Alcohol use: Not Currently    Comment: occasional   Drug use: Yes    Types: Marijuana    Comment: routine use   Sexual activity: Not Currently  Other Topics Concern   Not on file  Social History Narrative   Lives in [place] with spouse.  Works as a Air traffic controller.   Social Determinants of Health   Financial Resource Strain: Not on file  Food Insecurity: Not on file  Transportation Needs: Not on file  Physical Activity: Not on file  Stress: Not on file  Social Connections: Not on file  Intimate Partner Violence: Not on file    Family History: Family History  Problem Relation Age of Onset   Heart failure Mother    Stroke Father      Review of Systems: All other systems reviewed and are otherwise negative except as noted above.  Physical Exam: Vitals:   06/25/21 1106  BP: 122/70  Pulse: 64  SpO2: 91%  Weight: 193 lb (87.5 kg)  Height: 5\' 11"  (1.803 m)     GEN- The patient is well appearing, alert and oriented x 3 today.   HEENT: normocephalic, atraumatic; sclera clear, conjunctiva pink; hearing intact; oropharynx clear; neck  supple  Lungs- Clear to ausculation bilaterally, normal work of breathing.  No wheezes, rales, rhonchi Heart- Regular rate and rhythm, no murmurs, rubs or gallops  GI- soft, non-tender, non-distended, bowel sounds present  Extremities- no clubbing or cyanosis. No edema MS- no significant deformity or atrophy Skin- warm and dry, no rash or lesion; PPM pocket well  healed Psych- euthymic mood, full affect Neuro- strength and sensation are intact  PPM Interrogation- reviewed in detail today,  See PACEART report  EKG:  EKG is not ordered today.  Recent Labs: 06/08/2021: B Natriuretic Peptide 322.7 06/11/2021: ALT 18; BUN 30; Creatinine, Ser 1.58; Hemoglobin 14.1; Magnesium 2.0; Platelets 127; Potassium 3.9; Sodium 136; TSH 0.160   Wt Readings from Last 3 Encounters:  06/25/21 193 lb (87.5 kg)  06/08/21 175 lb 0.7 oz (79.4 kg)  06/07/21 175 lb (79.4 kg)     Other studies Reviewed: Additional studies/ records that were reviewed today include: Previous EP office notes, Previous remote checks, Most recent labwork.   Assessment and Plan:  1. Symptomatic complete heart block s/p St Jude PPM Normal PPM function.  See Claudia Desanctis Art report No changes today.   2. Permanent atrial fibrillation/ atypical atrial flutter S/p MAZE and multiple PVIs Not a candidate for further EP procedures or medicines Labs today.    3. Chronic diastolic dysfunction with RV failure/ pulmonary htn Stable volume today No change required today   4. SOB Multifactorial Chronic Follows with CHF and pulmonary teams  5. Short term memory issues Neurology follow up gas been arranged.  Current medicines are reviewed at length with the patient today.    Labs/ tests ordered today include:  Orders Placed This Encounter  Procedures   Basic metabolic panel   TSH   CBC    Disposition:   Follow up with EP APP in 6 months    Signed, Shirley Friar, PA-C  06/25/2021 11:44 AM  [facility] [address] [place]  [address] [phone] (office) [phone] (fax)

## 2021-06-25 NOTE — Patient Instructions (Signed)
Medication Instructions:  Your physician recommends that you continue on your current medications as directed. Please refer to the Current Medication list given to you today.  *If you need a refill on your cardiac medications before your next appointment, please call your pharmacy*   Lab Work: TODAY: BMET, CBC, TSH  If you have labs (blood work) drawn today and your tests are completely normal, you will receive your results only by: [place] (if you have MyChart) OR A paper copy in the mail If you have any lab test that is abnormal or we need to change your treatment, we will call you to review the results.   Follow-Up: At [facility], you and your health needs are our priority.  As part of our continuing mission to provide you with exceptional heart care, we have created designated Provider Care Teams.  These Care Teams include your primary Cardiologist (physician) and Advanced Practice Providers (APPs -  Physician Assistants and Nurse Practitioners) who all work together to provide you with the care you need, when you need it.  Your next appointment:   6 month(s)  The format for your next appointment:   In Person  Provider:   Legrand Como "Oda Kilts, PA-C

## 2021-07-02 NOTE — Progress Notes (Signed)
[facility]    Provider:  Dr Jaynee Eagles Requesting Provider: Dawley, Theodoro Doing, DO Primary Care Provider:  Velna Hatchet, MD  CC:  pain In the legs  HPI:  [name] is a 71 y.o. male here as requested by Dawley, Theodoro Doing, DO for neuropathy. PMHx PAF, HTN, aortic stenosis, pulmonary HTN, AV block, chronic diastolic heart failure, afib, CAD, sleep apnea, hypoxia, COPD, T2DM, CKD, pacemaker,hallucinations, bradycardia, asthma, renal cell cancer, marijuana dependence.  I reviewed Dr. Rubbie Battiest notes.  Dr. Baird Lyons evaluated patient for legs/feet pain.  He has he had an MRI of the lumbar spine and cervical spine, but continues to complain of right-sided neck pain with bilateral C6 radiculopathy, greater than C5 radiculopathy.  He also complains of continued bilateral lower extremity pain with intermittent weakness "giving out" of his legs.  He is on gabapentin and oxycodone and tramadol which I assume is for his pain.  Per Dr. Reatha Armour, MRI of the cervical spine showed degenerative disc disease primarily at C4-C5 and C5-C6 with bilateral right greater than left neuroforaminal narrowing that is moderate to severe.  There is moderate degenerative changes throughout the remainder of the spine without significant high-grade stenosis.  There is no cord signal change or cord compression.  MRI of the lumbar spine from May 22, 2020 showed loss of lordosis with mild to moderate degenerative changes throughout, no high-grade stenosis throughout.  He will be undergoing epidural steroid injections in the cervical spine for treatment of his neck pain and radiculopathy.  Sent here for evaluation of foot pain/peripheral neuropathy possibly  They have been hurting for years. In the feet, and his calf muscles are sore, the worse pain at night when stopping, sharp pains in the feet and in the lower leg bilaterally. It can be in the thighs as well. The feet hurt, the big toe, the whole foot, burning pain,  doesn't hurt to walk, when he is walking legs hurt after a bit then has to stand for a few minutes. Feet become red, lot os hyperpigmentation in the distal legs, no hair, shiny skin.Wife is here and provides much information. The pain is severe. The legs ache in bed and the feet hurt. No other focal neurologic deficits, associated symptoms, inciting events or modifiable factors.  Reviewed notes, labs and imaging from outside physicians, which showed :  2022: IMPRESSION: No evidence of right lower extremity deep venous thrombosis.  Hgba1c 06/11/2021 6.6 (5 years ago 6.9), BMP bun 29 creat 1.87  Reviewed MRI c and L spine reports  Disc levels:   C2-3: The disc appears normal. Asymmetric facet hypertrophy on the left contributes to mild left foraminal narrowing. No cord deformity.   C3-4: Mild disc bulging and uncinate spurring with asymmetric left-sided facet hypertrophy. No spinal stenosis. Mild left greater than right foraminal narrowing.   C4-5: Mild loss of disc height with disc bulging and bilateral uncinate spurring. The CSF surrounding the cord is partially effaced without cord deformity. Moderate foraminal narrowing is present bilaterally with potential encroachment on either C5 nerve root.   C5-6: Moderate loss of disc height with posterior osteophytes covering diffusely bulging disc material and bilateral uncinate spurring. The CSF surrounding the cord is partially effaced without cord deformity. Moderate foraminal narrowing bilaterally which could affect either C6 nerve root.   C6-7: Mild disc bulging and uncinate spurring. Mild spinal stenosis without cord deformity. The foramina appear sufficiently patent.   C7-T1: Small central disc protrusion and mild bilateral facet hypertrophy. No spinal  stenosis or significant foraminal narrowing.   MRI LUMBAR SPINE FINDINGS 05/23/2021:   Segmentation: Conventional anatomy assumed, with the last open disc space designated  L5-S1.Concordant with previous imaging.   Alignment:  Physiologic.   Vertebrae: No worrisome osseous lesion, acute fracture or pars defect. The visualized sacroiliac joints appear unremarkable.   Conus medullaris: Extends to the L1 level and appears normal.   Paraspinal and other soft tissues: No acute paraspinal abnormalities identified. There is aortic and branch vessel atherosclerosis. There is chronic right renal cortical scarring. Posteriorly in the interpolar region of the left kidney is a 2.0 cm T2 hypointense lesion (image 8/6) which appears similar in size to abdominopelvic CT 08/09/2020. This underwent biopsy under ultrasound 11/15/2020. There is prominent fatty atrophy of the erector spinae musculature bilaterally.   Disc levels:   No significant disc space findings from T11-12 through L2-3.   L3-4: Stable disc bulging with endplate osteophytes asymmetric to the right and mild bilateral facet and ligamentous hypertrophy. Stable asymmetric mild narrowing of the right lateral recess and right foramen.   L4-5: Left-sided laminectomy. Stable mild disc bulging and bilateral facet hypertrophy. Mild lateral recess narrowing bilaterally without nerve root encroachment. The foramina appear sufficiently patent.   L5-S1: Probable postsurgical changes at this level. Mild disc bulging, facet and ligamentous hypertrophy with mild narrowing of the lateral recesses, similar to previous CT. The foramina appear sufficiently patent.   IMPRESSION: 1. Multilevel cervical spondylosis as detailed above. The CSF surrounding the cord is effaced at C4-5 and C5-6 without cord deformity or abnormal cord signal. 2. Multilevel foraminal narrowing in the cervical spine which may affect the exiting nerve roots, greatest bilaterally at C4-5 and C5-6. 3. Mild lumbar spondylosis which appears similar to previous CT of 5 months ago. There is mild lateral recess narrowing without high-grade spinal  stenosis or definite nerve root encroachment. 4. A previously biopsied left renal lesion is grossly unchanged in size.    CT head: reviewed images and agreee 1. No acute intracranial abnormalities. 2. Chronic small vessel ischemic disease. 3. Right maxillary sinus retention cyst or polyp.  Review of Systems: Patient complains of symptoms per HPI as well as the following symptoms swelling. Pertinent negatives and positives per HPI. All others negative.   Social History   Socioeconomic History   Marital status: Married    Spouse name: Not on file   Number of children: Not on file   Years of education: Not on file   Highest education level: Not on file  Occupational History   Occupation: retired  Tobacco Use   Smoking status: Former    Packs/day: 1.00    Years: 25.00    Pack years: 25.00    Types: Cigarettes    Start date: 1963    Quit date: 07/18/1991    Years since quitting: 29.9    Passive exposure: Never   Smokeless tobacco: Never  Vaping Use   Vaping Use: Never used  Substance and Sexual Activity   Alcohol use: Not Currently    Comment: occasional   Drug use: Yes    Types: Marijuana    Comment: routine use   Sexual activity: Not Currently  Other Topics Concern   Not on file  Social History Narrative   Lives in [place] with spouse.  Works as a Air traffic controller.   Social Determinants of Health   Financial Resource Strain: Not on file  Food Insecurity: Not on file  Transportation Needs: Not on file  Physical Activity:  Not on file  Stress: Not on file  Social Connections: Not on file  Intimate Partner Violence: Not on file    Family History  Problem Relation Age of Onset   Heart failure Mother    Stroke Father    Neuropathy Neg Hx     Past Medical History:  Diagnosis Date   Aortic stenosis, mild    Arthritis of hand    "just a little bit in both hands" (03/31/2012)   Asthma    "little bit" (03/31/2012)   Atrial fibrillation ([place])    dx  '04; DCCV '04, placed on flecainide, failed DCCV 04/2010, flecainide stopped; s/p successful A.fib ablation 01/31/12   CHF (congestive heart failure) ([place])    Controlled type 2 diabetes mellitus without complication, without long-term current use of insulin ([place]) 12/13/2019   COPD (chronic obstructive pulmonary disease) ([place])    DJD (degenerative joint disease)    Fibromyalgia    GERD (gastroesophageal reflux disease)    Hyperlipidemia    Hypertension    Hypothyroidism    S/P radiation   Left atrial enlargement    LA size 18m by echo 11/21/11   Mitral regurgitation    trivial   Obstructive sleep apnea    mild by sleep study 2013; pt stated he does not have a machine because "it wasn't bad enough for him to have one"   Pacemaker 03/31/2012   Restless leg syndrome    Second degree AV block    Splenomegaly    Visit for monitoring Tikosyn therapy 04/16/2016    Patient Active Problem List   Diagnosis Date Noted   Pain in both lower extremities 07/03/2021   Peripheral neuropathy 067/67/2094  Acute metabolic encephalopathy 070/96/2836  Marijuana dependence ([facility] 06/09/2021   DNR (do not resuscitate) 06/09/2021   Chest pain 06/08/2021   Renal lesion 02/14/2021   Status post cryoablation 02/14/2021   Allergic rhinitis 12/08/2020   Preop pulmonary/respiratory exam 03/27/2020   Acute on chronic diastolic (congestive) heart failure ([facility] 01/22/2020   Abdominal pain 01/22/2020   Hypothyroidism 12/13/2019   Controlled type 2 diabetes mellitus with stage 3 chronic kidney disease, without long-term current use of insulin ([facility] 12/13/2019   Chronic kidney disease, stage 3b ([facility] 12/13/2019   Acute bacterial bronchitis 12/13/2019   COPD exacerbation ([facility] 12/13/2019   COPD with acute exacerbation ([facility] 12/12/2019   COPD mixed type ([facility] 10/16/2019   Typical atrial flutter (HCC)    Chronic respiratory failure with hypoxia ([facility] 05/23/2016   CAD (coronary artery disease) 05/23/2016    Persistent atrial fibrillation (H[facility])    Atypical atrial flutter ([facility]    Visit for monitoring Tikosyn therapy 04/16/2016   Chronic diastolic CHF (congestive heart failure) ([facility] 02/03/2016   Cough 12/06/2015   Hallucinations 09/04/2015   Lymphadenopathy 09/04/2015   Ascending aortic aneurysm 09/04/2015   Hypoxia 09/02/2015   S/P aortic valve replacement with bioprosthetic valve 09/02/2015   Pleural effusion 09/02/2015   S/P AVR 08/22/2015   GERD without esophagitis 09/20/2014   Intrinsic asthma 10/13/2013   Dyspnea 09/15/2013   Pulmonary hypertension ([facility] 09/15/2013   Shortness of breath 05/05/2013   Pacemaker-St.Jude 04/01/2012   Bradycardia 03/31/2012   Second degree AV block 03/31/2012   AV block, 1st degree 02/01/2012   PAF (paroxysmal atrial fibrillation) ([facility] 11/13/2011   Fatigue 11/13/2011   Hypertension 11/13/2011   Aortic stenosis 11/13/2011   Sleep apnea 11/13/2011    Past Surgical History:  Procedure Laterality Date   AORTIC VALVE  REPLACEMENT N/A 08/22/2015   Procedure: AORTIC VALVE REPLACEMENT (AVR);  Surgeon: Ivin Poot, MD;  Location: [facility];  Service: Open Heart Surgery;  Laterality: N/A;   ATRIAL FIBRILLATION ABLATION  01/30/2012   PVI by Dr. Rayann Heman   ATRIAL FIBRILLATION ABLATION N/A 01/31/2012   Procedure: ATRIAL FIBRILLATION ABLATION;  Surgeon: Thompson Grayer, MD;  Location[facility] CATH LAB;  Service: Cardiovascular;  Laterality: N/A;   ATRIAL FIBRILLATION ABLATION N/A 04/19/2020   Procedure: ATRIAL FIBRILLATION ABLATION;  Surgeon: Thompson Grayer, MD;  Location: [place] CV LAB;  Service: Cardiovascular;  Laterality: N/A;   BACK SURGERY     X 3   CARDIAC CATHETERIZATION N/A 08/09/2015   Procedure: Right/Left Heart Cath and Coronary Angiography;  Surgeon: Peter M Martinique, MD;  Location: [place] CV LAB;  Service: Cardiovascular;  Laterality: N/A;   CARDIAC CATHETERIZATION N/A 02/05/2016   Procedure: Right/Left Heart Cath and Coronary Angiography;   Surgeon: Jettie Booze, MD;  Location: [place] CV LAB;  Service: Cardiovascular;  Laterality: N/A;   CARDIAC CATHETERIZATION N/A 04/17/2016   Procedure: Right Heart Cath;  Surgeon: Jolaine Artist, MD;  Location: [place] CV LAB;  Service: Cardiovascular;  Laterality: N/A;   CARDIOVASCULAR STRESS TEST  03/12/2002   EF 48%, NO EVIDENCE OF ISCHEMIA   CARDIOVERSION  01/2012; 03/31/2012   CARDIOVERSION N/A 02/25/2012   Procedure: CARDIOVERSION;  Surgeon: Thompson Grayer, MD;  Location: [facility] CATH LAB;  Service: Cardiovascular;  Laterality: N/A;   CARDIOVERSION N/A 03/31/2012   Procedure: CARDIOVERSION;  Surgeon: Thompson Grayer, MD;  Location: [facility] CATH LAB;  Service: Cardiovascular;  Laterality: N/A;   CARDIOVERSION N/A 11/23/2015   Procedure: CARDIOVERSION;  Surgeon: Larey Dresser, MD;  Location: [place];  Service: Cardiovascular;  Laterality: N/A;   CARDIOVERSION N/A 04/08/2016   Procedure: CARDIOVERSION;  Surgeon: Jolaine Artist, MD;  Location: [place];  Service: Cardiovascular;  Laterality: N/A;   CARDIOVERSION N/A 09/14/2018   Procedure: CARDIOVERSION;  Surgeon: Josue Hector, MD;  Location: [place];  Service: Cardiovascular;  Laterality: N/A;   CARDIOVERSION N/A 10/19/2019   Procedure: CARDIOVERSION;  Surgeon: Jolaine Artist, MD;  Location: [facility] ENDOSCOPY;  Service: Cardiovascular;  Laterality: N/A;   CARDIOVERSION N/A 02/25/2020   Procedure: CARDIOVERSION;  Surgeon: Fay Records, MD;  Location: [place];  Service: Cardiovascular;  Laterality: N/A;   CARDIOVERSION N/A 05/25/2020   Procedure: CARDIOVERSION;  Surgeon: Lelon Perla, MD;  Location: [facility] ENDOSCOPY;  Service: Cardiovascular;  Laterality: N/A;   CARDIOVERSION N/A 07/10/2020   Procedure: CARDIOVERSION;  Surgeon: Jerline Pain, MD;  Location: [place];  Service: Cardiovascular;  Laterality: N/A;   CLIPPING OF ATRIAL APPENDAGE N/A 08/22/2015   Procedure: CLIPPING OF ATRIAL APPENDAGE;   Surgeon: Ivin Poot, MD;  Location: [place];  Service: Open Heart Surgery;  Laterality: N/A;   DOPPLER ECHOCARDIOGRAPHY  03/11/2002   EF 70-75%   FINGER SURGERY Left    Middle finger   FINGER TENDON REPAIR  1980's   "right little finger" (03/31/2012)   INSERT / REPLACE / REMOVE PACEMAKER     St Jude   IR RADIOLOGIST EVAL & MGMT  05/06/2019   IR RADIOLOGIST EVAL & MGMT  10/06/2019   IR RADIOLOGIST EVAL & MGMT  10/31/2020   IR RADIOLOGIST EVAL & MGMT  01/18/2021   IR RADIOLOGIST EVAL & MGMT  03/08/2021   IR RADIOLOGIST EVAL & MGMT  06/13/2021   KIDNEY SURGERY Left    LEFT AND RIGHT HEART  CATHETERIZATION WITH CORONARY ANGIOGRAM N/A 10/27/2012   Procedure: LEFT AND RIGHT HEART CATHETERIZATION WITH CORONARY ANGIOGRAM;  Surgeon: Laverda Page, MD;  Location: [facility] CATH LAB;  Service: Cardiovascular;  Laterality: N/A;   LUMBAR DISC SURGERY  1980's X2;  2000's   MAZE N/A 08/22/2015   Procedure: MAZE;  Surgeon: Ivin Poot, MD;  Location: [place];  Service: Open Heart Surgery;  Laterality: N/A;   PACEMAKER INSERTION  03/31/2012   STJ Accent DR pacemaker implanted by Dr Rayann Heman   PERMANENT PACEMAKER INSERTION N/A 03/31/2012   Procedure: PERMANENT PACEMAKER INSERTION;  Surgeon: Thompson Grayer, MD;  Location: [facility] CATH LAB;  Service: Cardiovascular;  Laterality: N/A;   PPM GENERATOR CHANGEOUT N/A 08/08/2020   Procedure: PPM GENERATOR CHANGEOUT;  Surgeon: Thompson Grayer, MD;  Location: [place] CV LAB;  Service: Cardiovascular;  Laterality: N/A;   RADIOLOGY WITH ANESTHESIA Left 02/14/2021   Procedure: RADIOLOGY WITH ANESTHESIA LEFT RENAL CRYOABLATION;  Surgeon: Aletta Edouard, MD;  Location: [facility];  Service: Radiology;  Laterality: Left;   TEE WITHOUT CARDIOVERSION  01/30/2012   Procedure: TRANSESOPHAGEAL ECHOCARDIOGRAM (TEE);  Surgeon: Larey Dresser, MD;  Location: [place];  Service: Cardiovascular;  Laterality: N/A;  ablation next day   TEE WITHOUT CARDIOVERSION N/A 08/22/2015    Procedure: TRANSESOPHAGEAL ECHOCARDIOGRAM (TEE);  Surgeon: Ivin Poot, MD;  Location: [place];  Service: Open Heart Surgery;  Laterality: N/A;   TEE WITHOUT CARDIOVERSION N/A 10/19/2019   Procedure: TRANSESOPHAGEAL ECHOCARDIOGRAM (TEE);  Surgeon: Jolaine Artist, MD;  Location: [facility] ENDOSCOPY;  Service: Cardiovascular;  Laterality: N/A;   US ECHOCARDIOGRAPHY  08/07/2009   EF 55-60%    Current Outpatient Medications  Medication Sig Dispense Refill   albuterol (PROVENTIL HFA;VENTOLIN HFA) 108 (90 Base) MCG/ACT inhaler Inhale 2 puffs into the lungs every 6 (six) hours as needed for wheezing or shortness of breath. 1 Inhaler 5   amLODipine (NORVASC) 5 MG tablet Take 2.5 mg by mouth daily.     apixaban (ELIQUIS) 5 MG TABS tablet Take 1 tablet (5 mg total) by mouth 2 (two) times daily. 60 tablet 6   bisoprolol (ZEBETA) 5 MG tablet Take 1 tablet (5 mg total) by mouth daily. Needs appt (Patient taking differently: Take 5 mg by mouth daily.) 90 tablet 0   Budeson-Glycopyrrol-Formoterol (BREZTRI AEROSPHERE) 160-9-4.8 MCG/ACT AERO Inhale 2 puffs into the lungs in the morning and at bedtime. 32.1 g 3   Calcium-Magnesium-Zinc (CAL-MAG-ZINC PO) Take 1 tablet by mouth daily with breakfast.     Cholecalciferol (VITAMIN D3 SUPER STRENGTH) 50 MCG (2000 UT) CAPS Take 8,000 Units by mouth at bedtime.     dicyclomine (BENTYL) 10 MG capsule Take 1 capsule (10 mg total) by mouth every 6 (six) hours as needed (For abdominal cramping). 20 capsule 0   empagliflozin (JARDIANCE) 10 MG TABS tablet Take 1 tablet (10 mg total) by mouth daily before breakfast. (Patient taking differently: Take 10 mg by mouth daily.) 30 tablet 6   fexofenadine (ALLEGRA) 180 MG tablet Take 1 tablet (180 mg total) by mouth daily as needed for allergies or rhinitis.     gabapentin (NEURONTIN) 300 MG capsule Take 600-1,200 mg by mouth See admin instructions. Take 600 mg by mouth in the morning, 1,200 mg at bedtime, and an additional 600 mg once  a day as needed for neuropathy  3   levothyroxine (SYNTHROID, LEVOTHROID) 175 MCG tablet Take 175 mcg by mouth daily before breakfast.     LORazepam (ATIVAN) 1 MG tablet  Take 1 tablet (1 mg total) by mouth every 8 (eight) hours as needed (nausea and/or restlessness). (Patient taking differently: Take 1 mg by mouth at bedtime.) 15 tablet 0   Menthol-Camphor (TIGER BALM ARTHRITIS RUB EX) Apply 1 application topically as needed (for leg pain).     montelukast (SINGULAIR) 10 MG tablet Take 1 tablet (10 mg total) by mouth at bedtime. 90 tablet 3   Multiple Vitamin (MULTIVITAMIN WITH MINERALS) TABS tablet Take 1 tablet by mouth daily. 30 tablet 0   ondansetron (ZOFRAN-ODT) 4 MG disintegrating tablet Take 1 tablet (4 mg total) by mouth every 8 (eight) hours as needed for nausea or vomiting. (Patient taking differently: Take 4 mg by mouth every 8 (eight) hours as needed for nausea or vomiting (dissolve orally).) 12 tablet 1   oxyCODONE-acetaminophen (PERCOCET/ROXICET) 5-325 MG tablet Take 1 tablet by mouth 2 (two) times daily.     OXYGEN Inhale 2 L/min into the lungs See admin instructions. 2 L/min at bedtime and 2 L/min throughout the day as needed for shortness of breath     oxymetazoline (AFRIN) 0.05 % nasal spray Place 1 spray into both nostrils as needed (for nose bleeds).     pantoprazole (PROTONIX) 40 MG tablet Take 40 mg by mouth daily before breakfast.     polyethylene glycol powder (GLYCOLAX/MIRALAX) 17 GM/SCOOP powder Take 17 g by mouth daily as needed for mild constipation.     potassium chloride SA (KLOR-CON) 20 MEQ tablet Take 20 mEq by mouth See admin instructions. Take 20 mEq by mouth two times a day on the days when Torsemide is taken     predniSONE (DELTASONE) 2.5 MG tablet Take 2.5 mg by mouth daily with breakfast.     PREDNISONE PO Taper as directed     promethazine (PHENERGAN) 25 MG tablet Take 1 tablet (25 mg total) by mouth every 6 (six) hours as needed for nausea or vomiting. 15 tablet  0   rOPINIRole (REQUIP) 1 MG tablet Take 1 tablet (1 mg total) by mouth in the morning and at bedtime.     rosuvastatin (CRESTOR) 10 MG tablet Take 10 mg by mouth at bedtime.      spironolactone (ALDACTONE) 25 MG tablet Take 1 tablet (25 mg total) by mouth daily. 90 tablet 3   SYSTANE ULTRA PF 0.4-0.3 % SOLN Apply 1 drop to eye 3 (three) times daily as needed (for dryness).     torsemide (DEMADEX) 20 MG tablet Take 30 mg by mouth every other day.     traMADol (ULTRAM) 50 MG tablet Take 100 mg by mouth 2 (two) times daily.     traZODone (DESYREL) 50 MG tablet Take 50 mg by mouth at bedtime.     No current facility-administered medications for this visit.    Allergies as of 07/03/2021 - Review Complete 07/03/2021  Allergen Reaction Noted   Meloxicam Rash and Other (See Comments) 01/31/2012   Vancomycin Other (See Comments) 09/02/2015    Vitals: BP 134/60    Pulse 60    Ht '5\' 11"'$  (1.803 m)    Wt 193 lb 6.4 oz (87.7 kg)    BMI 26.97 kg/m  Last Weight:  Wt Readings from Last 1 Encounters:  07/03/21 193 lb 6.4 oz (87.7 kg)   Last Height:   Ht Readings from Last 1 Encounters:  07/03/21 '5\' 11"'$  (1.803 m)     Physical exam: Exam: Gen: NAD, conversant, well nourised, obese, well groomed, adentulous  CV: RRR, +SEM. No Carotid Bruits. No peripheral edema, warm, nontender Eyes: Conjunctivae clear without exudates or hemorrhage Feet: Rubor, hyperpigmentation distally in the legs, shiny skin, no hair  Neuro: Detailed Neurologic Exam  Speech:    Speech is normal; fluent and spontaneous with normal comprehension.  Cognition:    The patient is oriented to person, place, and time;     recent and remote memory intact;     language fluent;     normal attention, concentration,     fund of knowledge Cranial Nerves:    The pupils are equal, round, and reactive to light. Pupils too small to visualize fundi. Visual fields are full to finger confrontation. Extraocular  movements are intact. Trigeminal sensation is intact and the muscles of mastication are normal. The face is symmetric. The palate elevates in the midline. Hearing intact. Voice is normal. Shoulder shrug is normal. The tongue has normal motion without fasciculations.   Coordination:    Normal   Gait:    Slightly ide based but not ataxic or stumbling, not shuffling  Motor Observation:    No asymmetry, no atrophy, and no involuntary movements noted. Tone:    Normal muscle tone.    Posture:    Posture is normal. normal erect    Strength:    Strength is V/V in the upper and lower limbs.      Sensation: absent pin prick and vibration distally in the legs     Reflex Exam:  DTR's:    Absent AJs, otherwise deep tendon reflexes in the upper and lower extremities are symmetrical trace to 1+ bilaterally.   Toes:    The toes are equiv bilaterally.   Clonus:    Clonus is absent.    Assessment/Plan:  71 y.o. male here as requested by Dawley, [place], DO for neuropathy. PMHx PAF, HTN, aortic stenosis, pulmonary HTN, AV block, chronic diastolic heart failure, afib, CAD, sleep apnea, hypoxia, COPD, T2DM, CKD, pacemaker,hallucinations, bradycardia, asthma, renal cell cancer, marijuana dependence. On oxygen.   - Rubor, hyperpigmentation distally in the legs, shiny skin, no hair, smoked cigarrettes for many years: His legs hurt when walking and he stands for a few minutes he can continue: Needs evaluation for PAD/PVD as clinically warranted by primary care - Peripheral neuropathy: likely due to PVD and diabetes and other risk factors such as hx of B12 deficiency(used to get shots). Continue Gabapentin and pain management with pcp. Close management of vascular risk factors. Will check several other labs - return to primary care for management including pain.   Orders Placed This Encounter  Procedures   B12 and Folate Panel   Methylmalonic acid, serum   Heavy metals, blood   Vitamin B6   Multiple  Myeloma Panel (SPEP&IFE w/QIG)   Vitamin B1   No orders of the defined types were placed in this encounter.   Cc: Dawley, Theodoro Doing, DO,  Velna Hatchet, MD  Sarina Ill, MD  [facility] [address] [place], [place] [address]  Phone [phone] Fax [phone]

## 2021-07-03 ENCOUNTER — Other Ambulatory Visit: Payer: Self-pay

## 2021-07-03 ENCOUNTER — Encounter: Payer: Self-pay | Admitting: Neurology

## 2021-07-03 ENCOUNTER — Ambulatory Visit: Payer: Medicare Other | Admitting: Neurology

## 2021-07-03 VITALS — BP 134/60 | HR 60 | Ht 71.0 in | Wt 193.4 lb

## 2021-07-03 DIAGNOSIS — G6289 Other specified polyneuropathies: Secondary | ICD-10-CM

## 2021-07-03 DIAGNOSIS — I739 Peripheral vascular disease, unspecified: Secondary | ICD-10-CM

## 2021-07-03 DIAGNOSIS — E538 Deficiency of other specified B group vitamins: Secondary | ICD-10-CM | POA: Diagnosis not present

## 2021-07-03 DIAGNOSIS — E531 Pyridoxine deficiency: Secondary | ICD-10-CM | POA: Diagnosis not present

## 2021-07-03 DIAGNOSIS — M79605 Pain in left leg: Secondary | ICD-10-CM

## 2021-07-03 DIAGNOSIS — M79604 Pain in right leg: Secondary | ICD-10-CM

## 2021-07-03 DIAGNOSIS — G629 Polyneuropathy, unspecified: Secondary | ICD-10-CM | POA: Insufficient documentation

## 2021-07-03 DIAGNOSIS — E519 Thiamine deficiency, unspecified: Secondary | ICD-10-CM

## 2021-07-03 NOTE — Patient Instructions (Addendum)
Blood work today   Peripheral Vascular Disease Peripheral vascular disease (PVD) is a disease of the blood vessels. PVD may also be called peripheral artery disease (PAD) or poor circulation. PVD is the blocking or hardening of the arteries anywhere within the circulatory system beyond the heart. This can result in a decreased supply of blood to the arms, legs, and internal organs, such as the stomach or kidneys. However, PVD most often affects a person's lower legs and feet. Without treatment, PVD often worsens. PVD can lead to acute limb ischemia. This occurs when an arm or leg suddenly has trouble getting enough blood. This is a medical emergency. What are the causes? The most common cause of PVD is atherosclerosis. This is a buildup of fatty material and other substances (plaque)inside your arteries. Pieces of plaque can break off from the walls of an artery and become stuck in a smaller artery, blocking blood flow and possibly causing acute limb ischemia. Other common causes of PVD include: Blood clots that form inside the blood vessels. Injuries to blood vessels. Diseases that cause inflammation of blood vessels or cause blood vessel tightening (spasms). What increases the risk? The following factors may make you more likely to develop this condition: A family history of PVD. Common medical conditions, including: High cholesterol. Diabetes. High blood pressure (hypertension). Heart disease. Known atherosclerotic disease in another area of the body. Past injury, such as burns or a broken bone. Other medical conditions, such as: Buerger's disease. This is caused by inflamed blood vessels in your hands and feet. Some forms of arthritis. Birth defects that affect the arteries in your legs. Kidney disease. Using tobacco and nicotine products. Not getting enough exercise. Obesity. Being age 25 or older, or being age 84 or older and having the other risk factors. What are the signs or  symptoms? This condition may cause different symptoms. Your symptoms depend on what body part is not getting enough blood. Common signs and symptoms include: Cramps in your buttocks, legs, and feet. Intermittent claudication. This is pain and weakness in your legs during activity that resolves with rest. Leg pain at rest and leg numbness, tingling, or weakness. Coldness in a leg or foot, especially when compared to the other leg or foot. Skin or hair changes. These can include: Hair loss. Shiny skin. Pale or bluish skin. Thick toenails. Inability to get or maintain an erection (erectile dysfunction). Tiredness (fatigue). Weak pulse or no pulse in the feet. People with PVD are more likely to develop open wounds (ulcers) and sores on their toes, feet, or legs. The ulcers or sores may take longer than normal to heal. How is this diagnosed? PVD is diagnosed based on your signs and symptoms, a physical exam, and your medical history. You may also have other tests to find the cause. Tests include: Ankle-brachial index test.This test compares the blood pressure readings of the legs and arms. This may also include an exercise ankle-brachial index test in which you walk on a treadmill to check your symptoms. Doppler ultrasound. This takes pictures of blood flow through your blood vessels. Imaging studies that use dye to show blood flow. These are: CT angiogram. Magnetic resonance angiogram, or MRA. How is this treated? Treatment for PVD depends on the cause of your condition, how severe your symptoms are, and your age. Underlying causes need to be treated and controlled. These include long-term (chronic) conditions, such as diabetes, high cholesterol, and hypertension. Treatment may include: Lifestyle changes, such as: Quitting tobacco use.  Exercising regularly. Following a low-fat, low-cholesterol diet. Not drinking alcohol. Taking medicines, such as: Blood thinners to prevent blood  clots. Medicines to improve blood flow. Medicines to improve cholesterol levels. Procedures, such as: Angioplasty. This uses an inflated balloon to open a blocked artery and improve blood flow. Stent implant. This inserts a small mesh tube to keep a blocked artery open. Peripheral bypass surgery. This reroutes blood flow around a blocked artery. Surgery to remove dead tissue from an infected wound (debridement). Amputation. This is surgical removal of the affected limb. It may be necessary in cases of acute limb ischemia when medical or surgical treatments have not helped. Follow these instructions at home: Medicines Take over-the-counter and prescription medicines only as told by your health care provider. If you are taking blood thinners: Talk with your health care provider before you take any medicines that contain aspirin or NSAIDs, such as ibuprofen. These medicines increase your risk for dangerous bleeding. Take your medicine exactly as told, at the same time every day. Avoid activities that could cause injury or bruising, and follow instructions about how to prevent falls. Wear a medical alert bracelet or carry a card that lists what medicines you take. Lifestyle   Exercise regularly. Ask your health care provider about some good activities for you. Talk with your health care provider about maintaining a healthy weight. If needed, ask about losing weight. Eat a diet that is low in fat and cholesterol. If you need help, talk with your health care provider. Do not drink alcohol. Do not use any products that contain nicotine or tobacco. These products include cigarettes, chewing tobacco, and vaping devices, such as e-cigarettes. If you need help quitting, ask your health care provider. General instructions Take good care of your feet. To do this: Wear comfortable shoes that fit well. Check your feet often for any cuts or sores. Get an annual influenza vaccine. Keep all follow-up  visits. This is important. Where to find more information Society for Vascular Surgery: vascular.org American Heart Association: heart.org National Heart, Lung, and Blood Institute: https://www.hartman-hill.biz/ Contact a health care provider if: You have leg cramps while walking. You have leg pain when you rest. Your leg or foot feels cold. Your skin changes color. You have erectile dysfunction. You have cuts or sores on your legs or feet that do not heal. Get help right away if: You have sudden changes in color and feeling of your arms or legs, such as: Your arm or leg turns cold, numb, and blue. Your arm or leg becomes red, warm, swollen, painful, or numb. You have any symptoms of a stroke. "BE FAST" is an easy way to remember the main warning signs of a stroke: B - Balance. Signs are dizziness, sudden trouble walking, or loss of balance. E - Eyes. Signs are trouble seeing or a sudden change in vision. F - Face. Signs are sudden weakness or numbness of the face, or the face or eyelid drooping on one side. A - Arms. Signs are weakness or numbness in an arm. This happens suddenly and usually on one side of the body. S - Speech. Signs are sudden trouble speaking, slurred speech, or trouble understanding what people say. T - Time. Time to call emergency services. Write down what time symptoms started. You have other signs of a stroke, such as: A sudden, severe headache with no known cause. Nausea or vomiting. Seizure. You have chest pain or trouble breathing. These symptoms may represent a serious problem that is  an emergency. Do not wait to see if the symptoms will go away. Get medical help right away. Call your local emergency services (911 in the [place] Do not drive yourself to the hospital. Summary Peripheral vascular disease (PVD) is a disease of the blood vessels. PVD is the blocking or hardening of the arteries anywhere within the circulatory system beyond the heart. PVD may cause different  symptoms. Your symptoms depend on what part of your body is not getting enough blood. Treatment for PVD depends on what caused it, how severe your symptoms are, and your age. This information is not intended to replace advice given to you by your health care provider. Make sure you discuss any questions you have with your health care provider. Document Revised: 10/18/2019 Document Reviewed: 10/18/2019 Elsevier Patient Education  2022 [place].  Peripheral Neuropathy Peripheral neuropathy is a type of nerve damage. It affects nerves that carry signals between the spinal cord and the arms, legs, and the rest of the body (peripheral nerves). It does not affect nerves in the spinal cord or brain. In peripheral neuropathy, one nerve or a group of nerves may be damaged. Peripheral neuropathy is a broad category that includes many specific nerve disorders, like diabetic neuropathy, hereditary neuropathy, and carpal tunnel syndrome. What are the causes? This condition may be caused by: Diabetes. This is the most common cause of peripheral neuropathy. Nerve injury. Pressure or stress on a nerve that lasts a long time. Lack (deficiency) of B vitamins. This can result from alcoholism, poor diet, or a restricted diet. Infections. Autoimmune diseases, such as rheumatoid arthritis and systemic lupus erythematosus. Nerve diseases that are passed from parent to child (inherited). Some medicines, such as cancer medicines (chemotherapy). Poisonous (toxic) substances, such as lead and mercury. Too little blood flowing to the legs. Kidney disease. Thyroid disease. In some cases, the cause of this condition is not known. What are the signs or symptoms? Symptoms of this condition depend on which of your nerves is damaged. Common symptoms include: Loss of feeling (numbness) in the feet, hands, or both. Tingling in the feet, hands, or both. Burning pain. Very sensitive skin. Weakness. Not being able to  move a part of the body (paralysis). Muscle twitching. Clumsiness or poor coordination. Loss of balance. Not being able to control your bladder. Feeling dizzy. Sexual problems. How is this diagnosed? Diagnosing and finding the cause of peripheral neuropathy can be difficult. Your health care provider will take your medical history and do a physical exam. A neurological exam will also be done. This involves checking things that are affected by your brain, spinal cord, and nerves (nervous system). For example, your health care provider will check your reflexes, how you move, and what you can feel. You may have other tests, such as: Blood tests. Electromyogram (EMG) and nerve conduction tests. These tests check nerve function and how well the nerves are controlling the muscles. Imaging tests, such as CT scans or MRI to rule out other causes of your symptoms. Removing a small piece of nerve to be examined in a lab (nerve biopsy). Removing and examining a small amount of the fluid that surrounds the brain and spinal cord (lumbar puncture). How is this treated? Treatment for this condition may involve: Treating the underlying cause of the neuropathy, such as diabetes, kidney disease, or vitamin deficiencies. Stopping medicines that can cause neuropathy, such as chemotherapy. Medicine to help relieve pain. Medicines may include: Prescription or over-the-counter pain medicine. Antiseizure medicine. Antidepressants. Pain-relieving  patches that are applied to painful areas of skin. Surgery to relieve pressure on a nerve or to destroy a nerve that is causing pain. Physical therapy to help improve movement and balance. Devices to help you move around (assistive devices). Follow these instructions at home: Medicines Take over-the-counter and prescription medicines only as told by your health care provider. Do not take any other medicines without first asking your health care provider. Do not drive  or use heavy machinery while taking prescription pain medicine. Lifestyle  Do not use any products that contain nicotine or tobacco, such as cigarettes and e-cigarettes. Smoking keeps blood from reaching damaged nerves. If you need help quitting, ask your health care provider. Avoid or limit alcohol. Too much alcohol can cause a vitamin B deficiency, and vitamin B is needed for healthy nerves. Eat a healthy diet. This includes: Eating foods that are high in fiber, such as fresh fruits and vegetables, whole grains, and beans. Limiting foods that are high in fat and processed sugars, such as fried or sweet foods. General instructions  If you have diabetes, work closely with your health care provider to keep your blood sugar under control. If you have numbness in your feet: Check every day for signs of injury or infection. Watch for redness, warmth, and swelling. Wear padded socks and comfortable shoes. These help protect your feet. Develop a good support system. Living with peripheral neuropathy can be stressful. Consider talking with a mental health specialist or joining a support group. Use assistive devices and attend physical therapy as told by your health care provider. This may include using a walker or a cane. Keep all follow-up visits as told by your health care provider. This is important. Contact a health care provider if: You have new signs or symptoms of peripheral neuropathy. You are struggling emotionally from dealing with peripheral neuropathy. Your pain is not well-controlled. Get help right away if: You have an injury or infection that is not healing normally. You develop new weakness in an arm or leg. You have fallen or do so frequently. Summary Peripheral neuropathy is when the nerves in the arms, or legs are damaged, resulting in numbness, weakness, or pain. There are many causes of peripheral neuropathy, including diabetes, pinched nerves, vitamin deficiencies,  autoimmune disease, and hereditary conditions. Diagnosing and finding the cause of peripheral neuropathy can be difficult. Your health care provider will take your medical history, do a physical exam, and do tests, including blood tests and nerve function tests. Treatment involves treating the underlying cause of the neuropathy and taking medicines to help control pain. Physical therapy and assistive devices may also help. This information is not intended to replace advice given to you by your health care provider. Make sure you discuss any questions you have with your health care provider. Document Revised: 01/25/2020 Document Reviewed: 01/25/2020 Elsevier Patient Education  2022 [employer].

## 2021-07-09 LAB — HEAVY METALS, BLOOD
Arsenic: 1 ug/L (ref 0–9)
Lead, Blood: 1.4 ug/dL (ref 0.0–3.4)
Mercury: <1 ug/L (ref 0.0–14.9)

## 2021-07-09 LAB — MULTIPLE MYELOMA PANEL, SERUM
Albumin SerPl Elph-Mcnc: 3.8 g/dL (ref 2.9–4.4)
Albumin/Glob SerPl: 1.3 (ref 0.7–1.7)
Alpha 1: 0.2 g/dL (ref 0.0–0.4)
Alpha2 Glob SerPl Elph-Mcnc: 1 g/dL (ref 0.4–1.0)
B-Globulin SerPl Elph-Mcnc: 1.2 g/dL (ref 0.7–1.3)
Gamma Glob SerPl Elph-Mcnc: 0.7 g/dL (ref 0.4–1.8)
Globulin, Total: 3 g/dL (ref 2.2–3.9)
IgA/Immunoglobulin A, Serum: 238 mg/dL (ref 61–437)
IgG (Immunoglobin G), Serum: 732 mg/dL (ref 603–1613)
IgM (Immunoglobulin M), Srm: 119 mg/dL (ref 15–143)
Total Protein: 6.8 g/dL (ref 6.0–8.5)

## 2021-07-09 LAB — B12 AND FOLATE PANEL
Folate: >20 ng/mL (ref >3.0)
Vitamin B-12: 1085 pg/mL (ref 232–1245)

## 2021-07-09 LAB — METHYLMALONIC ACID, SERUM: Methylmalonic Acid: 216 nmol/L (ref 0–378)

## 2021-07-09 LAB — VITAMIN B1: Thiamine: 208.4 nmol/L — ABNORMAL HIGH (ref 66.5–200.0)

## 2021-07-09 LAB — VITAMIN B6: Vitamin B6: 60.1 ug/L (ref 3.4–65.2)

## 2021-07-30 ENCOUNTER — Telehealth: Payer: Self-pay | Admitting: Neurology

## 2021-07-30 NOTE — Telephone Encounter (Signed)
Pt's wife, Sheffield Hawker [facility] not on file) have not heard from anyone in regards to labwork results. Would like a call from the nurse. ?

## 2021-07-30 NOTE — Telephone Encounter (Signed)
Returned call to pts wife.  Relayed that per Dr. Jaynee Eagles that the blood work looked fine.  She will call pcp about referral to PVD.  She appreicated call back.  ?

## 2021-08-01 ENCOUNTER — Emergency Department (HOSPITAL_BASED_OUTPATIENT_CLINIC_OR_DEPARTMENT_OTHER)
Admission: EM | Admit: 2021-08-01 | Discharge: 2021-08-01 | Disposition: A | Discharge status: Home / Self Care | Payer: Medicare Other | Attending: Emergency Medicine | Admitting: Emergency Medicine

## 2021-08-01 ENCOUNTER — Other Ambulatory Visit: Payer: Self-pay

## 2021-08-01 ENCOUNTER — Emergency Department (HOSPITAL_BASED_OUTPATIENT_CLINIC_OR_DEPARTMENT_OTHER): Payer: Medicare Other

## 2021-08-01 ENCOUNTER — Encounter (HOSPITAL_BASED_OUTPATIENT_CLINIC_OR_DEPARTMENT_OTHER): Payer: Self-pay | Admitting: Emergency Medicine

## 2021-08-01 DIAGNOSIS — M79604 Pain in right leg: Secondary | ICD-10-CM | POA: Insufficient documentation

## 2021-08-01 DIAGNOSIS — M79605 Pain in left leg: Secondary | ICD-10-CM | POA: Diagnosis not present

## 2021-08-01 DIAGNOSIS — G6289 Other specified polyneuropathies: Secondary | ICD-10-CM | POA: Insufficient documentation

## 2021-08-01 DIAGNOSIS — K429 Umbilical hernia without obstruction or gangrene: Secondary | ICD-10-CM | POA: Insufficient documentation

## 2021-08-01 DIAGNOSIS — Z7901 Long term (current) use of anticoagulants: Secondary | ICD-10-CM | POA: Diagnosis not present

## 2021-08-01 DIAGNOSIS — G629 Polyneuropathy, unspecified: Secondary | ICD-10-CM

## 2021-08-01 DIAGNOSIS — R1030 Lower abdominal pain, unspecified: Secondary | ICD-10-CM

## 2021-08-01 DIAGNOSIS — N189 Chronic kidney disease, unspecified: Secondary | ICD-10-CM | POA: Insufficient documentation

## 2021-08-01 DIAGNOSIS — R1031 Right lower quadrant pain: Secondary | ICD-10-CM | POA: Diagnosis present

## 2021-08-01 LAB — COMPREHENSIVE METABOLIC PANEL
ALT: 20 U/L (ref 0–44)
AST: 23 U/L (ref 15–41)
Albumin: 4.3 g/dL (ref 3.5–5.0)
Alkaline Phosphatase: 72 U/L (ref 38–126)
Anion gap: 9 (ref 5–15)
BUN: 25 mg/dL — ABNORMAL HIGH (ref 8–23)
CO2: 27 mmol/L (ref 22–32)
Calcium: 9.6 mg/dL (ref 8.9–10.3)
Chloride: 100 mmol/L (ref 98–111)
Creatinine, Ser: 1.74 mg/dL — ABNORMAL HIGH (ref 0.61–1.24)
GFR, Estimated: 41 mL/min — ABNORMAL LOW (ref >60)
Glucose, Bld: 111 mg/dL — ABNORMAL HIGH (ref 70–99)
Potassium: 4.5 mmol/L (ref 3.5–5.1)
Sodium: 136 mmol/L (ref 135–145)
Total Bilirubin: 1.2 mg/dL (ref 0.3–1.2)
Total Protein: 7.6 g/dL (ref 6.5–8.1)

## 2021-08-01 LAB — URINALYSIS, ROUTINE W REFLEX MICROSCOPIC
Bilirubin Urine: NEGATIVE
Glucose, UA: >=500 mg/dL — AB
Hgb urine dipstick: NEGATIVE
Ketones, ur: NEGATIVE mg/dL
Leukocytes,Ua: NEGATIVE
Nitrite: NEGATIVE
Protein, ur: 30 mg/dL — AB
Specific Gravity, Urine: 1.015 (ref 1.005–1.030)
pH: 7 (ref 5.0–8.0)

## 2021-08-01 LAB — CBC
HCT: 43.3 % (ref 39.0–52.0)
Hemoglobin: 13.9 g/dL (ref 13.0–17.0)
MCH: 29 pg (ref 26.0–34.0)
MCHC: 32.1 g/dL (ref 30.0–36.0)
MCV: 90.2 fL (ref 80.0–100.0)
Platelets: 149 10*3/uL — ABNORMAL LOW (ref 150–400)
RBC: 4.8 MIL/uL (ref 4.22–5.81)
RDW: 16.7 % — ABNORMAL HIGH (ref 11.5–15.5)
WBC: 7.2 10*3/uL (ref 4.0–10.5)
nRBC: 0 % (ref 0.0–0.2)

## 2021-08-01 LAB — URINALYSIS, MICROSCOPIC (REFLEX): Squamous Epithelial / HPF: NONE SEEN (ref 0–5)

## 2021-08-01 LAB — LIPASE, BLOOD: Lipase: 31 U/L (ref 11–51)

## 2021-08-01 MED ORDER — MORPHINE SULFATE (PF) 4 MG/ML IV SOLN
4.0000 mg | Freq: Once | INTRAVENOUS | Status: AC
  Administered 2021-08-01: 4 mg via INTRAVENOUS
  Filled 2021-08-01: qty 1

## 2021-08-01 MED ORDER — SODIUM CHLORIDE 0.9 % IV BOLUS
500.0000 mL | Freq: Once | INTRAVENOUS | Status: AC
Start: 2021-08-01 — End: 2021-08-01
  Administered 2021-08-01: 500 mL via INTRAVENOUS

## 2021-08-01 MED ORDER — ONDANSETRON HCL 4 MG/2ML IJ SOLN
4.0000 mg | Freq: Once | INTRAMUSCULAR | Status: AC
  Administered 2021-08-01: 4 mg via INTRAVENOUS
  Filled 2021-08-01: qty 2

## 2021-08-01 NOTE — ED Notes (Signed)
Patient transported to CT 

## 2021-08-01 NOTE — Discharge Instructions (Signed)
You were seen in the emergency department for worsening leg pain and lower abdominal pain.  Your lab work was unremarkable and your CAT scan did not show any acute findings.  We are giving you contact information for vascular surgery.  Please follow-up with your primary care doctor.  Return to the emergency department if any worsening or concerning symptoms. ?

## 2021-08-01 NOTE — ED Provider Notes (Signed)
?[place] EMERGENCY DEPARTMENT ?Provider Note ? ? ?CSN: [id number] ?Arrival date & time: 08/01/21  1354 ? ?  ? ?History ? ?Chief Complaint  ?Patient presents with  ? Abdominal Pain  ? ? ?[name] is a 71 y.o. male.  He is here with worsening chronic leg pain right greater than left.  This has been going on for years and he has seen both neurology and neurosurgery for it.  Has been told he has peripheral vascular disease.  Pain is chronic and poorly controlled with his home oxycodone.  He is supposed to see his primary tomorrow but the pain was too bad.  He is also complaining of recurrent low abdominal pain and nausea.  Started 2 days ago.  Has had before.  No vomiting fevers chills.  Decreased appetite.  He is on Eliquis. ? ? ?Abdominal Pain ?Pain location:  RLQ, LLQ and suprapubic ?Pain quality: aching   ?Pain severity:  Moderate ?Onset quality:  Gradual ?Duration:  3 days ?Timing:  Intermittent ?Progression:  Unchanged ?Chronicity:  Recurrent ?Context: not trauma   ?Relieved by:  None tried ?Worsened by:  Nothing ?Ineffective treatments:  None tried ?Associated symptoms: nausea   ?Associated symptoms: no chest pain, no chills, no cough, no diarrhea, no dysuria, no fever, no hematemesis, no hematochezia, no hematuria, no shortness of breath and no vomiting   ? ?  ? ?Home Medications ?Prior to Admission medications   ?Medication Sig Start Date End Date Taking? Authorizing Provider  ?albuterol (PROVENTIL HFA;VENTOLIN HFA) 108 (90 Base) MCG/ACT inhaler Inhale 2 puffs into the lungs every 6 (six) hours as needed for wheezing or shortness of breath. 12/28/15   Collene Gobble, MD  ?amLODipine (NORVASC) 5 MG tablet Take 2.5 mg by mouth daily.    [provider]  ?apixaban (ELIQUIS) 5 MG TABS tablet Take 1 tablet (5 mg total) by mouth 2 (two) times daily. 05/11/21   Allred, Jeneen Rinks, MD  ?bisoprolol (ZEBETA) 5 MG tablet Take 1 tablet (5 mg total) by mouth daily. Needs appt ?Patient taking  differently: Take 5 mg by mouth daily. 12/25/20   Bensimhon, Shaune Pascal, MD  ?Budeson-Glycopyrrol-Formoterol (BREZTRI AEROSPHERE) 160-9-4.8 MCG/ACT AERO Inhale 2 puffs into the lungs in the morning and at bedtime. 11/30/20   Martyn Ehrich, NP  ?Calcium-Magnesium-Zinc (CAL-MAG-ZINC PO) Take 1 tablet by mouth daily with breakfast.    [provider]  ?Cholecalciferol (VITAMIN D3 SUPER STRENGTH) 50 MCG (2000 UT) CAPS Take 8,000 Units by mouth at bedtime.    [provider]  ?dicyclomine (BENTYL) 10 MG capsule Take 1 capsule (10 mg total) by mouth every 6 (six) hours as needed (For abdominal cramping). 06/08/21   Molpus, John, MD  ?empagliflozin (JARDIANCE) 10 MG TABS tablet Take 1 tablet (10 mg total) by mouth daily before breakfast. ?Patient taking differently: Take 10 mg by mouth daily. 03/14/20   Bensimhon, Shaune Pascal, MD  ?fexofenadine (ALLEGRA) 180 MG tablet Take 1 tablet (180 mg total) by mouth daily as needed for allergies or rhinitis. 01/23/20   Aline August, MD  ?gabapentin (NEURONTIN) 300 MG capsule Take 600-1,200 mg by mouth See admin instructions. Take 600 mg by mouth in the morning, 1,200 mg at bedtime, and an additional 600 mg once a day as needed for neuropathy 09/13/14   [provider]  ?levothyroxine (SYNTHROID, LEVOTHROID) 175 MCG tablet Take 175 mcg by mouth daily before breakfast.    [provider]  ?LORazepam (ATIVAN) 1 MG tablet Take 1  tablet (1 mg total) by mouth every 8 (eight) hours as needed (nausea and/or restlessness). ?Patient taking differently: Take 1 mg by mouth at bedtime. 08/09/20   Carmin Muskrat, MD  ?Menthol-Camphor (TIGER BALM ARTHRITIS RUB EX) Apply 1 application topically as needed (for leg pain).    [provider]  ?montelukast (SINGULAIR) 10 MG tablet Take 1 tablet (10 mg total) by mouth at bedtime. 12/11/20   Parrett, Fonnie Mu, NP  ?Multiple Vitamin (MULTIVITAMIN WITH MINERALS) TABS tablet Take 1 tablet by mouth daily. 12/15/19    Raiford Noble Latif, DO  ?ondansetron (ZOFRAN-ODT) 4 MG disintegrating tablet Take 1 tablet (4 mg total) by mouth every 8 (eight) hours as needed for nausea or vomiting. ?Patient taking differently: Take 4 mg by mouth every 8 (eight) hours as needed for nausea or vomiting (dissolve orally). 06/07/21   Fredia Sorrow, MD  ?oxyCODONE-acetaminophen (PERCOCET/ROXICET) 5-325 MG tablet Take 1 tablet by mouth 2 (two) times daily.    [provider]  ?OXYGEN Inhale 2 L/min into the lungs See admin instructions. 2 L/min at bedtime and 2 L/min throughout the day as needed for shortness of breath    [provider]  ?oxymetazoline (AFRIN) 0.05 % nasal spray Place 1 spray into both nostrils as needed (for nose bleeds).    [provider]  ?pantoprazole (PROTONIX) 40 MG tablet Take 40 mg by mouth daily before breakfast.    [provider]  ?polyethylene glycol powder (GLYCOLAX/MIRALAX) 17 GM/SCOOP powder Take 17 g by mouth daily as needed for mild constipation. 01/23/20   Aline August, MD  ?potassium chloride SA (KLOR-CON) 20 MEQ tablet Take 20 mEq by mouth See admin instructions. Take 20 mEq by mouth two times a day on the days when Torsemide is taken    [provider]  ?predniSONE (DELTASONE) 2.5 MG tablet Take 2.5 mg by mouth daily with breakfast.    [provider]  ?PREDNISONE PO Taper as directed    [provider]  ?promethazine (PHENERGAN) 25 MG tablet Take 1 tablet (25 mg total) by mouth every 6 (six) hours as needed for nausea or vomiting. 12/10/20   Drenda Freeze, MD  ?rOPINIRole (REQUIP) 1 MG tablet Take 1 tablet (1 mg total) by mouth in the morning and at bedtime. 01/23/20   Aline August, MD  ?rosuvastatin (CRESTOR) 10 MG tablet Take 10 mg by mouth at bedtime.  08/08/16   [provider]  ?spironolactone (ALDACTONE) 25 MG tablet Take 1 tablet (25 mg total) by mouth daily. 02/16/16   Shirley Friar, PA-C  ?SYSTANE ULTRA PF 0.4-0.3  % SOLN Apply 1 drop to eye 3 (three) times daily as needed (for dryness).    [provider]  ?torsemide (DEMADEX) 20 MG tablet Take 30 mg by mouth every other day.    [provider]  ?traMADol (ULTRAM) 50 MG tablet Take 100 mg by mouth 2 (two) times daily. 10/26/20   [provider]  ?traZODone (DESYREL) 50 MG tablet Take 50 mg by mouth at bedtime. 08/30/20   [provider]  ?   ? ?Allergies    ?Meloxicam and Vancomycin   ? ?Review of Systems   ?Review of Systems  ?Constitutional:  Negative for chills and fever.  ?Respiratory:  Negative for cough and shortness of breath.   ?Cardiovascular:  Negative for chest pain.  ?Gastrointestinal:  Positive for abdominal pain and nausea. Negative for diarrhea, hematemesis, hematochezia and vomiting.  ?Genitourinary:  Negative for dysuria and  hematuria.  ?Musculoskeletal:  Positive for gait problem.  ?Neurological:  Positive for numbness. Negative for weakness.  ? ?Physical Exam ?Updated Vital Signs ?BP 136/68 (BP Location: Right Arm)   Pulse (!) 59   Temp 98.1 ?F (36.7 ?C) (Oral)   Resp 20   Ht '5\' 11"'$  (1.803 m)   Wt 83.9 kg   SpO2 97%   BMI 25.80 kg/m?  ?Physical Exam ?Vitals and nursing note reviewed.  ?Constitutional:   ?   General: He is not in acute distress. ?   Appearance: Normal appearance. He is well-developed.  ?HENT:  ?   Head: Normocephalic and atraumatic.  ?Eyes:  ?   Conjunctiva/sclera: Conjunctivae normal.  ?Cardiovascular:  ?   Rate and Rhythm: Normal rate and regular rhythm.  ?   Heart sounds: No murmur heard. ?Pulmonary:  ?   Effort: Pulmonary effort is normal. No respiratory distress.  ?   Breath sounds: Normal breath sounds.  ?Abdominal:  ?   Palpations: Abdomen is soft.  ?   Tenderness: There is no abdominal tenderness. There is no guarding or rebound.  ?   Hernia: A hernia is present.  ?   Comments: He has a nontender reducible umbilical hernia  ?Musculoskeletal:     ?   General: No swelling.  ?   Cervical back:  Neck supple.  ?   Comments: He has chronic hyperpigmentation of his lower extremities below his knee.  There is thinning of the skin and lack of hair.  DP pulses 1+ bilaterally.  No cyanosis.  ?Skin: ?   General: Skin is warm

## 2021-08-01 NOTE — ED Triage Notes (Signed)
Lower abdominal pain last night , bilateral legs pain , right leg worse he said , cardiac Hx . Obvious skin discoloration bilaterally .  ?Denies chest pain , chronic shortness of breath  ?

## 2021-08-08 ENCOUNTER — Other Ambulatory Visit (HOSPITAL_COMMUNITY): Payer: Self-pay

## 2021-08-08 ENCOUNTER — Ambulatory Visit (INDEPENDENT_AMBULATORY_CARE_PROVIDER_SITE_OTHER): Payer: Medicare Other

## 2021-08-08 DIAGNOSIS — I442 Atrioventricular block, complete: Secondary | ICD-10-CM

## 2021-08-08 LAB — CUP PACEART REMOTE DEVICE CHECK
Battery Remaining Longevity: 128 mo
Battery Remaining Percentage: 95 %
Battery Voltage: 3.02 V
Brady Statistic RV Percent Paced: 99 %
Date Time Interrogation Session: 20230412023911
Implantable Lead Implant Date: 20131203
Implantable Lead Implant Date: 20131203
Implantable Lead Location: 753859
Implantable Lead Location: 753860
Implantable Lead Model: 1948
Implantable Pulse Generator Implant Date: 20220412
Lead Channel Impedance Value: 610 Ohm
Lead Channel Pacing Threshold Amplitude: 1.125 V
Lead Channel Pacing Threshold Pulse Width: 0.5 ms
Lead Channel Sensing Intrinsic Amplitude: 8.3 mV
Lead Channel Setting Pacing Amplitude: 1.375
Lead Channel Setting Pacing Pulse Width: 0.5 ms
Lead Channel Setting Sensing Sensitivity: 4 mV
Pulse Gen Model: 2272
Pulse Gen Serial Number: 3919039

## 2021-08-17 ENCOUNTER — Other Ambulatory Visit: Payer: Self-pay | Admitting: *Deleted

## 2021-08-17 DIAGNOSIS — M79605 Pain in left leg: Secondary | ICD-10-CM

## 2021-08-24 NOTE — Progress Notes (Signed)
Remote pacemaker transmission.   

## 2021-08-27 ENCOUNTER — Encounter: Payer: Self-pay | Admitting: Surgery

## 2021-08-27 ENCOUNTER — Ambulatory Visit (HOSPITAL_COMMUNITY)
Admission: RE | Admit: 2021-08-27 | Discharge: 2021-08-27 | Disposition: A | Discharge status: Home / Self Care | Payer: Medicare Other | Source: Ambulatory Visit | Attending: Surgery | Admitting: Surgery

## 2021-08-27 ENCOUNTER — Ambulatory Visit: Payer: Medicare Other | Admitting: Surgery

## 2021-08-27 VITALS — BP 148/75 | HR 59 | Temp 98.2°F | Resp 20 | Ht 71.0 in | Wt 187.0 lb

## 2021-08-27 DIAGNOSIS — M7989 Other specified soft tissue disorders: Secondary | ICD-10-CM

## 2021-08-27 DIAGNOSIS — M79605 Pain in left leg: Secondary | ICD-10-CM | POA: Insufficient documentation

## 2021-08-27 DIAGNOSIS — M79604 Pain in right leg: Secondary | ICD-10-CM | POA: Insufficient documentation

## 2021-08-27 NOTE — Progress Notes (Signed)
? ?[facility] ? ?Patient name: [name] MRN: [id number] DOB: Oct 12, 1950 Sex: male ? ? ?REQUESTING PROVIDER:  ? ? ER ? ? ?REASON FOR CONSULT:  ?  ?Leg pain ? ?HISTORY OF PRESENT ILLNESS:  ? ?[name] is a 71 y.o. male, who is referred for evaluation of bilateral leg pain.  The patient states that this is been going on for many years.  He states that he mainly gets pain at night and he describes this as burning in his feet and calves.  Occasionally he will get burning in his thighs.  He also describes some numbness.  He has had a MRI of his neck and back, and has seen neurology who did not feel that this explains his symptoms.  He has tried gabapentin with minimal relief.  He is here today for arterial evaluation. ? ?Patient does have leg swelling.  He has tried to wear compression stockings in the past.  He has a history of atrial fibrillation on Eliquis.  He has type 2 diabetes.  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  He has a pacemaker in place. ? ?PAST MEDICAL HISTORY  ? ? ?Past Medical History:  ?Diagnosis Date  ? Aortic stenosis, mild   ? Arthritis of hand   ? "just a little bit in both hands" (03/31/2012)  ? Asthma   ? "little bit" (03/31/2012)  ? Atrial fibrillation ([place])   ? dx '04; DCCV '04, placed on flecainide, failed DCCV 04/2010, flecainide stopped; s/p successful A.fib ablation 01/31/12  ? CHF (congestive heart failure) ([place])   ? Controlled type 2 diabetes mellitus without complication, without long-term current use of insulin ([place]) 12/13/2019  ? COPD (chronic obstructive pulmonary disease) ([place])   ? DJD (degenerative joint disease)   ? Fibromyalgia   ? GERD (gastroesophageal reflux disease)   ? Hyperlipidemia   ? Hypertension   ? Hypothyroidism   ? S/P radiation  ? Left atrial enlargement   ? LA size 70m by echo 11/21/11  ? Mitral regurgitation   ? trivial  ? Obstructive sleep apnea   ? mild by sleep study 2013; pt  stated he does not have a machine because "it wasn't bad enough for him to have one"  ? Pacemaker 03/31/2012  ? Restless leg syndrome   ? Second degree AV block   ? Splenomegaly   ? Visit for monitoring Tikosyn therapy 04/16/2016  ? ? ? ?FAMILY HISTORY  ? ?Family History  ?Problem Relation Age of Onset  ? Heart failure Mother   ? Stroke Father   ? Neuropathy Neg Hx   ? ? ?SOCIAL HISTORY:  ? ?Social History  ? ?Socioeconomic History  ? Marital status: Married  ?  Spouse name: Not on file  ? Number of children: Not on file  ? Years of education: Not on file  ? Highest education level: Not on file  ?Occupational History  ? Occupation: retired  ?Tobacco Use  ? Smoking status: Former  ?  Packs/day: 1.00  ?  Years: 25.00  ?  Pack years: 25.00  ?  Types: Cigarettes  ?  Start date: 1963  ?  Quit date: 07/18/1991  ?  Years since quitting: 30.1  ?  Passive exposure: Never  ? Smokeless tobacco: Never  ?Vaping Use  ? Vaping Use: Never used  ?Substance and Sexual Activity  ? Alcohol use: Not Currently  ?  Comment: occasional  ? Drug use: Yes  ?  Types: Marijuana  ?  Comment: routine use  ? Sexual activity: Not Currently  ?Other Topics Concern  ? Not on file  ?Social History Narrative  ? Lives in [place] with spouse.  Works as a Air traffic controller.  ? ?Social Determinants of Health  ? ?Financial Resource Strain: Not on file  ?Food Insecurity: Not on file  ?Transportation Needs: Not on file  ?Physical Activity: Not on file  ?Stress: Not on file  ?Social Connections: Not on file  ?Intimate Partner Violence: Not on file  ? ? ?ALLERGIES:  ? ? ?Allergies  ?Allergen Reactions  ? Meloxicam Rash and Other (See Comments)  ?  Red Man Syndrome, also  ? Vancomycin Other (See Comments)  ?  Red Man's syndrome 09/02/15, resolved with diphenhydramine and slowing of rate  ? ? ?CURRENT MEDICATIONS:  ? ? ?Current Outpatient Medications  ?Medication Sig Dispense Refill  ? albuterol (PROVENTIL HFA;VENTOLIN HFA) 108 (90 Base) MCG/ACT inhaler Inhale  2 puffs into the lungs every 6 (six) hours as needed for wheezing or shortness of breath. 1 Inhaler 5  ? amLODipine (NORVASC) 5 MG tablet Take 2.5 mg by mouth daily.    ? apixaban (ELIQUIS) 5 MG TABS tablet Take 1 tablet (5 mg total) by mouth 2 (two) times daily. 60 tablet 6  ? bisoprolol (ZEBETA) 5 MG tablet Take 1 tablet (5 mg total) by mouth daily. Needs appt (Patient taking differently: Take 5 mg by mouth daily.) 90 tablet 0  ? Budeson-Glycopyrrol-Formoterol (BREZTRI AEROSPHERE) 160-9-4.8 MCG/ACT AERO Inhale 2 puffs into the lungs in the morning and at bedtime. 32.1 g 3  ? Calcium-Magnesium-Zinc (CAL-MAG-ZINC PO) Take 1 tablet by mouth daily with breakfast.    ? Cholecalciferol (VITAMIN D3 SUPER STRENGTH) 50 MCG (2000 UT) CAPS Take 8,000 Units by mouth at bedtime.    ? dicyclomine (BENTYL) 10 MG capsule Take 1 capsule (10 mg total) by mouth every 6 (six) hours as needed (For abdominal cramping). 20 capsule 0  ? empagliflozin (JARDIANCE) 10 MG TABS tablet Take 1 tablet (10 mg total) by mouth daily before breakfast. (Patient taking differently: Take 10 mg by mouth daily.) 30 tablet 6  ? fexofenadine (ALLEGRA) 180 MG tablet Take 1 tablet (180 mg total) by mouth daily as needed for allergies or rhinitis.    ? gabapentin (NEURONTIN) 300 MG capsule Take 600-1,200 mg by mouth See admin instructions. Take 600 mg by mouth in the morning, 1,200 mg at bedtime, and an additional 600 mg once a day as needed for neuropathy  3  ? levothyroxine (SYNTHROID, LEVOTHROID) 175 MCG tablet Take 175 mcg by mouth daily before breakfast.    ? LORazepam (ATIVAN) 1 MG tablet Take 1 tablet (1 mg total) by mouth every 8 (eight) hours as needed (nausea and/or restlessness). (Patient taking differently: Take 1 mg by mouth at bedtime.) 15 tablet 0  ? Menthol-Camphor (TIGER BALM ARTHRITIS RUB EX) Apply 1 application topically as needed (for leg pain).    ? montelukast (SINGULAIR) 10 MG tablet Take 1 tablet (10 mg total) by mouth at bedtime. 90  tablet 3  ? Multiple Vitamin (MULTIVITAMIN WITH MINERALS) TABS tablet Take 1 tablet by mouth daily. 30 tablet 0  ? ondansetron (ZOFRAN-ODT) 4 MG disintegrating tablet Take 1 tablet (4 mg total) by mouth every 8 (eight) hours as needed for nausea or vomiting. (Patient taking differently: Take 4 mg by mouth every 8 (eight) hours as needed for nausea or vomiting (dissolve orally).) 12 tablet 1  ?  oxyCODONE-acetaminophen (PERCOCET/ROXICET) 5-325 MG tablet Take 1 tablet by mouth 2 (two) times daily.    ? OXYGEN Inhale 2 L/min into the lungs See admin instructions. 2 L/min at bedtime and 2 L/min throughout the day as needed for shortness of breath    ? oxymetazoline (AFRIN) 0.05 % nasal spray Place 1 spray into both nostrils as needed (for nose bleeds).    ? pantoprazole (PROTONIX) 40 MG tablet Take 40 mg by mouth daily before breakfast.    ? polyethylene glycol powder (GLYCOLAX/MIRALAX) 17 GM/SCOOP powder Take 17 g by mouth daily as needed for mild constipation.    ? potassium chloride SA (KLOR-CON) 20 MEQ tablet Take 20 mEq by mouth See admin instructions. Take 20 mEq by mouth two times a day on the days when Torsemide is taken    ? predniSONE (DELTASONE) 2.5 MG tablet Take 2.5 mg by mouth daily with breakfast.    ? promethazine (PHENERGAN) 25 MG tablet Take 1 tablet (25 mg total) by mouth every 6 (six) hours as needed for nausea or vomiting. 15 tablet 0  ? rOPINIRole (REQUIP) 1 MG tablet Take 1 tablet (1 mg total) by mouth in the morning and at bedtime.    ? rosuvastatin (CRESTOR) 10 MG tablet Take 10 mg by mouth at bedtime.     ? spironolactone (ALDACTONE) 25 MG tablet Take 1 tablet (25 mg total) by mouth daily. 90 tablet 3  ? SYSTANE ULTRA PF 0.4-0.3 % SOLN Apply 1 drop to eye 3 (three) times daily as needed (for dryness).    ? torsemide (DEMADEX) 20 MG tablet Take 30 mg by mouth every other day.    ? traMADol (ULTRAM) 50 MG tablet Take 100 mg by mouth 2 (two) times daily.    ? traZODone (DESYREL) 50 MG tablet Take  50 mg by mouth at bedtime.    ? ?No current facility-administered medications for this visit.  ? ? ?REVIEW OF SYSTEMS:  ? ?'[X]'$  denotes positive finding, '[ ]'$  denotes negative finding ?Cardiac  Comments:  ?C

## 2021-09-03 ENCOUNTER — Other Ambulatory Visit: Payer: Self-pay | Admitting: *Deleted

## 2021-09-03 DIAGNOSIS — M79604 Pain in right leg: Secondary | ICD-10-CM

## 2021-09-03 DIAGNOSIS — M7989 Other specified soft tissue disorders: Secondary | ICD-10-CM

## 2021-09-06 ENCOUNTER — Other Ambulatory Visit: Payer: Self-pay | Admitting: Primary Care

## 2021-09-06 MED ORDER — PREDNISONE 2.5 MG PO TABS: 2.5000 mg | ORAL_TABLET | Freq: Every day | ORAL | 2 refills | Status: DC

## 2021-09-16 ENCOUNTER — Other Ambulatory Visit: Payer: Self-pay | Admitting: Primary Care

## 2021-09-17 ENCOUNTER — Other Ambulatory Visit: Payer: Self-pay | Admitting: Emergency Medicine

## 2021-09-17 ENCOUNTER — Telehealth: Payer: Self-pay | Admitting: Emergency Medicine

## 2021-09-17 NOTE — Telephone Encounter (Signed)
Please call to make a follow up with Dr. Byrum first available.  ?

## 2021-10-11 ENCOUNTER — Encounter: Payer: Self-pay | Admitting: Emergency Medicine

## 2021-10-11 ENCOUNTER — Ambulatory Visit: Payer: Medicare Other | Admitting: Emergency Medicine

## 2021-10-11 DIAGNOSIS — K219 Gastro-esophageal reflux disease without esophagitis: Secondary | ICD-10-CM | POA: Diagnosis not present

## 2021-10-11 DIAGNOSIS — J309 Allergic rhinitis, unspecified: Secondary | ICD-10-CM

## 2021-10-11 DIAGNOSIS — J9611 Chronic respiratory failure with hypoxia: Secondary | ICD-10-CM | POA: Diagnosis not present

## 2021-10-11 DIAGNOSIS — J449 Chronic obstructive pulmonary disease, unspecified: Secondary | ICD-10-CM

## 2021-10-11 MED ORDER — PREDNISONE 2.5 MG PO TABS: 2.5000 mg | ORAL_TABLET | Freq: Every day | ORAL | 5 refills | Status: DC

## 2021-10-11 NOTE — Patient Instructions (Addendum)
Please continue Breztri 2 puffs twice a day.  Rinse and gargle after using.  We will complete patient assistance paperwork for this medication today Continue prednisone 2.5 mg once daily. Keep albuterol available to use 2 puffs if needed for shortness of breath, chest tightness, wheezing. You would probably benefit from wearing her oxygen more regularly with exertion.  Our goal is to keep saturations > 90%.  Continue use your oxygen at night while sleeping. Continue Singulair and Allegra as you have been taking them. Continue Protonix once daily Continue to follow with cardiology and interventional radiology Follow Dr. Lamonte Sakai in 6 months or sooner if you have any problems.

## 2021-10-11 NOTE — Assessment & Plan Note (Signed)
Continue Singulair and Allegra as you have been taking them.

## 2021-10-11 NOTE — Progress Notes (Signed)
Subjective:    Patient ID: [name], male    DOB: 10-Mar-1951, 71 y.o.   MRN: [id number]  Asthma He complains of cough and shortness of breath. There is no wheezing. His past medical history is significant for asthma.    ROV 03/07/16 -- this follow-up visit for multifactorial hypoxemic respiratory failure. He has COPD asthma, significant cardiac disease including AVR, atrial fibrillation post Maze procedure and secondary pulmonary hypertension. He required DC cardioversion and aggressive diuresis in July 2017. He was hospitalized again in October 2017, treated again for diuresis. A left and right heart cath was performed on 02/05/16 as detailed below. Note high PAOP prior to diuresis. Was started on O2 w exertion prior to the last admission. Notes that his SpO2 is at goal w rest, does note desats w exertion. He is on symbicort bid. Uses albuterol very rarely.   ROV 10/11/2021 --71 year old man with multifactorial hypoxemic respiratory failure due to COPD/fixed asthma as well as significant cardiac disease as below.  He has associated secondary pulmonary hypertension.  I last saw him in November 2017, last seen in our office 11/2020. PMH: Atrial fibrillation/maze and history of DC cardioversion, AVR Since I last saw him he was noted to have renal cell carcinoma, underwent ablation in IR 01/2021 Currently managed on Breztri, prednisone 2.5 mg daily.  Albuterol use a couple times a month.  He is using O2 with sleeping, but has not used regularly w exertion. He does not believe he is desaturating. Remains active, works outside. Wife believes that he may be desaturating, under-using his o2.  Singulair and allegra, protonix qd   Review of Systems  Constitutional: Negative.   HENT:  Positive for congestion.   Respiratory:  Positive for cough and shortness of breath. Negative for chest tightness and wheezing.   Cardiovascular: Negative.   Gastrointestinal: Negative.   Endocrine: Negative.    Genitourinary: Negative.   Musculoskeletal: Negative.   Allergic/Immunologic: Negative.   Hematological: Negative.   All other systems reviewed and are negative.    Objective:   Physical Exam  Vitals:  Vitals:   10/11/21 1136  BP: 130/70  Pulse: 60  Temp: 98.9 F (37.2 C)  TempSrc: Oral  SpO2: 96%  Weight: 183 lb 3.2 oz (83.1 kg)  Height: '5\' 11"'$  (1.803 m)    Body mass index is 25.55 kg/m. Wt Readings from Last 3 Encounters:  10/11/21 183 lb 3.2 oz (83.1 kg)  08/27/21 187 lb (84.8 kg)  08/01/21 185 lb (83.9 kg)   Gen: Pleasant, well-nourished, in no distress,  normal affect, on room air  ENT: No lesions,  mouth clear,  oropharynx clear, no postnasal drip  Neck: No JVD, no stridor  Lungs: No use of accessory muscles, few scattered expiratory wheezes  Cardiovascular: RRR, heart sounds normal, no murmur or gallops, no peripheral edema  Musculoskeletal: No deformities, no cyanosis or clubbing  Neuro: alert, non focal  Skin: Warm, no lesions or rashes       Assessment & Plan:  COPD mixed type (HCC) Please continue Breztri 2 puffs twice a day.  Rinse and gargle after using.  We will complete patient assistance paperwork for this medication today Continue prednisone 2.5 mg once daily. Keep albuterol available to use 2 puffs if needed for shortness of breath, chest tightness, wheezing. Follow Dr. Lamonte Sakai in 6 months or sooner if you have any problems.  Chronic respiratory failure with hypoxia ([place]) I think he is under using his oxygen with exertion based  on his description his wife description.  She has seen some cyanosis.  He does not recall seeing any overt desaturations on his pulse oximeter.  I encouraged him to wear more regularly with exertion and to keep track of his pulse oximetry, explained goal of 90% or greater.  He sleeps with his oxygen reliably.  Allergic rhinitis Continue Singulair and Allegra as you have been taking them.   GERD without  esophagitis Continue Protonix once daily    Baltazar Apo, MD, PhD 10/11/2021, 12:10 PM [facility] [phone] or if no answer [phone]

## 2021-10-11 NOTE — Assessment & Plan Note (Signed)
Continue Protonix once daily.  

## 2021-10-11 NOTE — Addendum Note (Signed)
Addended by: Gavin Potters R on: 10/11/2021 12:13 PM   Modules accepted: Orders

## 2021-10-11 NOTE — Assessment & Plan Note (Signed)
Please continue Breztri 2 puffs twice a day.  Rinse and gargle after using.  We will complete patient assistance paperwork for this medication today Continue prednisone 2.5 mg once daily. Keep albuterol available to use 2 puffs if needed for shortness of breath, chest tightness, wheezing. Follow Dr. Lamonte Sakai in 6 months or sooner if you have any problems.

## 2021-10-11 NOTE — Assessment & Plan Note (Signed)
I think he is under using his oxygen with exertion based on his description his wife description.  She has seen some cyanosis.  He does not recall seeing any overt desaturations on his pulse oximeter.  I encouraged him to wear more regularly with exertion and to keep track of his pulse oximetry, explained goal of 90% or greater.  He sleeps with his oxygen reliably.

## 2021-10-22 ENCOUNTER — Emergency Department (HOSPITAL_BASED_OUTPATIENT_CLINIC_OR_DEPARTMENT_OTHER): Payer: Medicare Other

## 2021-10-22 ENCOUNTER — Telehealth: Payer: Self-pay | Admitting: Emergency Medicine

## 2021-10-22 ENCOUNTER — Emergency Department (HOSPITAL_BASED_OUTPATIENT_CLINIC_OR_DEPARTMENT_OTHER)
Admission: EM | Admit: 2021-10-22 | Discharge: 2021-10-22 | Disposition: A | Discharge status: Home / Self Care | Payer: Medicare Other | Attending: Emergency Medicine | Admitting: Emergency Medicine

## 2021-10-22 ENCOUNTER — Encounter (HOSPITAL_BASED_OUTPATIENT_CLINIC_OR_DEPARTMENT_OTHER): Payer: Self-pay | Admitting: Emergency Medicine

## 2021-10-22 ENCOUNTER — Other Ambulatory Visit: Payer: Self-pay

## 2021-10-22 DIAGNOSIS — Z7901 Long term (current) use of anticoagulants: Secondary | ICD-10-CM | POA: Diagnosis not present

## 2021-10-22 DIAGNOSIS — M542 Cervicalgia: Secondary | ICD-10-CM

## 2021-10-22 DIAGNOSIS — J449 Chronic obstructive pulmonary disease, unspecified: Secondary | ICD-10-CM | POA: Insufficient documentation

## 2021-10-22 LAB — CBC WITH DIFFERENTIAL/PLATELET
Abs Immature Granulocytes: 0.03 10*3/uL (ref 0.00–0.07)
Basophils Absolute: 0 10*3/uL (ref 0.0–0.1)
Basophils Relative: 1 %
Eosinophils Absolute: 0.1 10*3/uL (ref 0.0–0.5)
Eosinophils Relative: 2 %
HCT: 43.6 % (ref 39.0–52.0)
Hemoglobin: 13.9 g/dL (ref 13.0–17.0)
Immature Granulocytes: 0 %
Lymphocytes Relative: 10 %
Lymphs Abs: 0.8 10*3/uL (ref 0.7–4.0)
MCH: 28.7 pg (ref 26.0–34.0)
MCHC: 31.9 g/dL (ref 30.0–36.0)
MCV: 90.1 fL (ref 80.0–100.0)
Monocytes Absolute: 0.8 10*3/uL (ref 0.1–1.0)
Monocytes Relative: 9 %
Neutro Abs: 6.3 10*3/uL (ref 1.7–7.7)
Neutrophils Relative %: 78 %
Platelets: 157 10*3/uL (ref 150–400)
RBC: 4.84 MIL/uL (ref 4.22–5.81)
RDW: 15.4 % (ref 11.5–15.5)
WBC: 8 10*3/uL (ref 4.0–10.5)
nRBC: 0 % (ref 0.0–0.2)

## 2021-10-22 LAB — BASIC METABOLIC PANEL
Anion gap: 8 (ref 5–15)
BUN: 31 mg/dL — ABNORMAL HIGH (ref 8–23)
CO2: 27 mmol/L (ref 22–32)
Calcium: 9.4 mg/dL (ref 8.9–10.3)
Chloride: 100 mmol/L (ref 98–111)
Creatinine, Ser: 1.98 mg/dL — ABNORMAL HIGH (ref 0.61–1.24)
GFR, Estimated: 35 mL/min — ABNORMAL LOW (ref >60)
Glucose, Bld: 118 mg/dL — ABNORMAL HIGH (ref 70–99)
Potassium: 4.6 mmol/L (ref 3.5–5.1)
Sodium: 135 mmol/L (ref 135–145)

## 2021-10-22 LAB — TROPONIN I (HIGH SENSITIVITY): Troponin I (High Sensitivity): 16 ng/L (ref <18)

## 2021-10-22 MED ORDER — IOHEXOL 350 MG/ML SOLN
100.0000 mL | Freq: Once | INTRAVENOUS | Status: AC | PRN
  Administered 2021-10-22: 75 mL via INTRAVENOUS

## 2021-10-22 MED ORDER — PREDNISONE 5 MG PO TABS: 2.5000 mg | ORAL_TABLET | Freq: Every day | ORAL | 5 refills | Status: DC

## 2021-10-22 NOTE — Telephone Encounter (Signed)
Called and spoke with pt's spouse who states she needed to have 5mg  prednisone Rx called in for pt as if 2.5mg  prednisone was ordered, this would cost more for them. She stated they can break the 5mg  in half. New Rx of 5mg  prednisone has been sent to pharmacy for pt. Nothing further needed.

## 2021-11-07 ENCOUNTER — Ambulatory Visit (INDEPENDENT_AMBULATORY_CARE_PROVIDER_SITE_OTHER): Payer: Medicare Other

## 2021-11-07 DIAGNOSIS — I442 Atrioventricular block, complete: Secondary | ICD-10-CM | POA: Diagnosis not present

## 2021-11-07 LAB — CUP PACEART REMOTE DEVICE CHECK
Battery Remaining Longevity: 125 mo
Battery Remaining Percentage: 93 %
Battery Voltage: 3.02 V
Brady Statistic RV Percent Paced: 99 %
Date Time Interrogation Session: 20230712052416
Implantable Lead Implant Date: 20131203
Implantable Lead Implant Date: 20131203
Implantable Lead Location: 753859
Implantable Lead Location: 753860
Implantable Lead Model: 1948
Implantable Pulse Generator Implant Date: 20220412
Lead Channel Impedance Value: 680 Ohm
Lead Channel Pacing Threshold Amplitude: 1.125 V
Lead Channel Pacing Threshold Pulse Width: 0.5 ms
Lead Channel Sensing Intrinsic Amplitude: 9.4 mV
Lead Channel Setting Pacing Amplitude: 1.375
Lead Channel Setting Pacing Pulse Width: 0.5 ms
Lead Channel Setting Sensing Sensitivity: 4 mV
Pulse Gen Model: 2272
Pulse Gen Serial Number: 3919039

## 2021-11-09 ENCOUNTER — Ambulatory Visit (HOSPITAL_COMMUNITY)
Admission: RE | Admit: 2021-11-09 | Discharge: 2021-11-09 | Disposition: A | Discharge status: Home / Self Care | Payer: Medicare Other | Source: Ambulatory Visit | Attending: Family Medicine | Admitting: Family Medicine

## 2021-11-09 ENCOUNTER — Encounter (HOSPITAL_COMMUNITY): Payer: Self-pay

## 2021-11-09 VITALS — BP 144/72 | HR 59 | Wt 181.0 lb

## 2021-11-09 DIAGNOSIS — I272 Pulmonary hypertension, unspecified: Secondary | ICD-10-CM | POA: Diagnosis not present

## 2021-11-09 DIAGNOSIS — E1122 Type 2 diabetes mellitus with diabetic chronic kidney disease: Secondary | ICD-10-CM | POA: Diagnosis not present

## 2021-11-09 DIAGNOSIS — I50810 Right heart failure, unspecified: Secondary | ICD-10-CM | POA: Insufficient documentation

## 2021-11-09 DIAGNOSIS — Z7984 Long term (current) use of oral hypoglycemic drugs: Secondary | ICD-10-CM | POA: Insufficient documentation

## 2021-11-09 DIAGNOSIS — Z950 Presence of cardiac pacemaker: Secondary | ICD-10-CM | POA: Insufficient documentation

## 2021-11-09 DIAGNOSIS — Z87891 Personal history of nicotine dependence: Secondary | ICD-10-CM | POA: Diagnosis not present

## 2021-11-09 DIAGNOSIS — N1832 Chronic kidney disease, stage 3b: Secondary | ICD-10-CM | POA: Diagnosis not present

## 2021-11-09 DIAGNOSIS — Z9981 Dependence on supplemental oxygen: Secondary | ICD-10-CM | POA: Diagnosis not present

## 2021-11-09 DIAGNOSIS — N183 Chronic kidney disease, stage 3 unspecified: Secondary | ICD-10-CM

## 2021-11-09 DIAGNOSIS — I483 Typical atrial flutter: Secondary | ICD-10-CM

## 2021-11-09 DIAGNOSIS — Z7952 Long term (current) use of systemic steroids: Secondary | ICD-10-CM | POA: Diagnosis not present

## 2021-11-09 DIAGNOSIS — I10 Essential (primary) hypertension: Secondary | ICD-10-CM

## 2021-11-09 DIAGNOSIS — Z952 Presence of prosthetic heart valve: Secondary | ICD-10-CM | POA: Insufficient documentation

## 2021-11-09 DIAGNOSIS — I5032 Chronic diastolic (congestive) heart failure: Secondary | ICD-10-CM | POA: Insufficient documentation

## 2021-11-09 DIAGNOSIS — J441 Chronic obstructive pulmonary disease with (acute) exacerbation: Secondary | ICD-10-CM | POA: Diagnosis not present

## 2021-11-09 DIAGNOSIS — I081 Rheumatic disorders of both mitral and tricuspid valves: Secondary | ICD-10-CM | POA: Insufficient documentation

## 2021-11-09 DIAGNOSIS — D49512 Neoplasm of unspecified behavior of left kidney: Secondary | ICD-10-CM | POA: Insufficient documentation

## 2021-11-09 DIAGNOSIS — I38 Endocarditis, valve unspecified: Secondary | ICD-10-CM

## 2021-11-09 DIAGNOSIS — I4819 Other persistent atrial fibrillation: Secondary | ICD-10-CM | POA: Diagnosis not present

## 2021-11-09 DIAGNOSIS — Z7901 Long term (current) use of anticoagulants: Secondary | ICD-10-CM | POA: Diagnosis not present

## 2021-11-09 DIAGNOSIS — Z79899 Other long term (current) drug therapy: Secondary | ICD-10-CM | POA: Diagnosis not present

## 2021-11-09 DIAGNOSIS — J9611 Chronic respiratory failure with hypoxia: Secondary | ICD-10-CM | POA: Insufficient documentation

## 2021-11-09 DIAGNOSIS — D49519 Neoplasm of unspecified behavior of unspecified kidney: Secondary | ICD-10-CM

## 2021-11-09 DIAGNOSIS — I2510 Atherosclerotic heart disease of native coronary artery without angina pectoris: Secondary | ICD-10-CM | POA: Diagnosis not present

## 2021-11-09 DIAGNOSIS — I130 Hypertensive heart and chronic kidney disease with heart failure and stage 1 through stage 4 chronic kidney disease, or unspecified chronic kidney disease: Secondary | ICD-10-CM | POA: Insufficient documentation

## 2021-11-09 NOTE — Progress Notes (Signed)
Advanced Heart Failure Clinic Note   Primary Care: Velna Hatchet, MD Primary Cardiologist: Dr. Acie Fredrickson EP: Dr. Alycia Rossetti, PA HF Cardiologist: Dr. Haroldine Laws   HPI: [name] is a 71 y.o.male with history of HTN, COPD with chronic respiratory failure (on 2L O2),  Afib s/p ablation s/p Pacer, normal cors by cath in 2014, and Aortic Stenosis s/p AVR + MAZE.   Admitte 02/02/16 with severe DOE with any exercise or walking 10-20 feet. Pt admitted and planned for [facility] 02/05/16. Diuresed with IV lasix with rapid improvement of symptoms. Overall diuresed 15.2 L and down 14 lbs. Discharge weight was 205 lbs. L/RHC 02/05/16 with no significant CAD. RHC showed significant difference between PCWP (50) and LVEDP (30) suggestive of MS but no MS on Echo. Weight at d/c was 205 pounds.   Echo 02/02/16 LVEF 60-65%, Grade 3 DD. Mild AI, Mild MR, RV mildly dilated and severely reduced. RA mildly dilated. PA peak pressure 74 mm Hg. Concerns for restrictive cardiomyopathy.   Admitted 12/17 for Tikosyn load. RHC completed during that admission as below.   Admitted 6/21 with recurrent respiratory failure and lactic acidosis felt to be mostly COPD flare. Had recurrent AF and underwent TEE-DC-CV on 10/19/19. Tikosyn continued.  TEE results LEFT VENTRICLE: EF = 60% No WMA RIGHT VENTRICLE: Moderately HK LEFT ATRIUM: Moderately to severely dilated. LA diameter 5.1 cm LEFT ATRIAL APPENDAGE: Surgically amputated. No clot in residual small pouch AORTIC VALVE:  Bioprosthetic valve. Functions normally. No AI/AS MITRAL VALVE:    Slight restriction of posterior leaflet 3+ posterior MR TRICUSPID VALVE: Normal severe TR. RVSP ~ 46mHG INTERATRIAL SEPTUM: No PFO/ASD  Admitted 9/21 with SOB/chills, leukocytosis (WBC 15.9k), concerning for COVID infection versus COPD exacerbation. Diuresed with IV lasix on presentation, CXR negative for pulmonary edema or infiltrates. No antibiotics administered, continuous 2L of  oxygen, prednisone 5 mg daily & inhalers daily for mild COPD exacerbation. Abdominal pain improved; CT abdomen unremarkable.   02/25/20 DCCV with Dr. RHarrington Challengermaintained NSR only for 5 hrs  Underwent repeat AF ablation 04/19/20. Returned to AF 04/21/20.   Repeat DC-CV 05/25/20 -> AF  Repeat DC-CV 07/11/19. PPM at [facility]  Echo 07/12/20: EF 55-60% RV ok. Mild MR/TR. Bioprosthetic AVR ok. Restrictive physiology. Back in AFL 3/22, Tikosyn stopped by EP and rate control strategy pursued.   PPM generator change 4/22.  Admitted 2/23 with CP. Echo showed EF 55-60%, RV normal. Cards consulted and felt CP was atypical and no further work up. Stable from HF perspective.   Today he returns for HF follow up with his wife. Overall feeling fine. He has SOB with stairs or walking further distances on flat ground. Main issue to leg/foot pain, following with [facility] for this. Denies abnormal bleeding, CP, dizziness, edema, or PND/Orthopnea. Appetite ok. No fever or chills. He does not weigh at home. Taking all medications.    Cardiac Studies: - Echo (2/23): EF 55-60%, normal RV, AV stable, mean gradient 11.0 mmHg, AAA 38 mm  - ABI (2/20): Normal  - PFTs (4/17) FEV1  1.6 (47%) FVC 2.25 (49%) DLCO 54%  - L/RHC (02/05/16) - Mid RCA lesion, 10 %stenosed. - Ost Cx to Prox Cx lesion, 20 %stenosed. - LV end diastolic pressure is moderately elevated. - Hemodynamic findings consistent with severe pulmonary hypertension. - No evidence of discordance on the LV/RV tracing. No evidence of pericardial constriction. - Severly elevated PCWP. - PA sat 51%. CO 6.2 L/min. Cardiac index 2.87. - No significant aortic valve gradient. -  There is no aortic valve stenosis.   RA mean 30 RV 87/9  (26) PA 100/56 (72) PCWP 51 LVEDP 31 AO 142/73 (100) Cardiac Output (Fick) 6.21 Cardiac Index (Fick) 2.87 PVR = 3.5 WU   - RHC (04/17/16) RA = 13 RV = 66/3/15 PA = 69/23 (42) PCW = 21 (v=38) Fick cardiac output/index =  6.3/2.9 PVR = 3.8 WU Ao sat = 87% (radial artery ABG) PA sat = 43%, 43% SVC sat = 50%  ROS: All systems reviewed and negative except as per HPI.   Past Medical History:  Diagnosis Date   Aortic stenosis, mild    Arthritis of hand    "just a little bit in both hands" (03/31/2012)   Asthma    "little bit" (03/31/2012)   Atrial fibrillation ([place])    dx '04; DCCV '04, placed on flecainide, failed DCCV 04/2010, flecainide stopped; s/p successful A.fib ablation 01/31/12   CHF (congestive heart failure) ([place])    Controlled type 2 diabetes mellitus without complication, without long-term current use of insulin ([place]) 12/13/2019   COPD (chronic obstructive pulmonary disease) ([facility])    DJD (degenerative joint disease)    Fibromyalgia    GERD (gastroesophageal reflux disease)    Hyperlipidemia    Hypertension    Hypothyroidism    S/P radiation   Left atrial enlargement    LA size 73m by echo 11/21/11   Mitral regurgitation    trivial   Obstructive sleep apnea    mild by sleep study 2013; pt stated he does not have a machine because "it wasn't bad enough for him to have one"   Pacemaker 03/31/2012   Restless leg syndrome    Second degree AV block    Splenomegaly    Visit for monitoring Tikosyn therapy 04/16/2016   Current Outpatient Medications  Medication Sig Dispense Refill   albuterol (PROVENTIL HFA;VENTOLIN HFA) 108 (90 Base) MCG/ACT inhaler Inhale 2 puffs into the lungs every 6 (six) hours as needed for wheezing or shortness of breath. 1 Inhaler 5   amLODipine (NORVASC) 5 MG tablet Take 2.5 mg by mouth daily.     apixaban (ELIQUIS) 5 MG TABS tablet Take 1 tablet (5 mg total) by mouth 2 (two) times daily. 60 tablet 6   bisoprolol (ZEBETA) 5 MG tablet Take 1 tablet (5 mg total) by mouth daily. Needs appt 90 tablet 0   Budeson-Glycopyrrol-Formoterol (BREZTRI AEROSPHERE) 160-9-4.8 MCG/ACT AERO Inhale 2 puffs into the lungs in the morning and at bedtime. 32.1 g 3   Calcium-Magnesium-Zinc  (CAL-MAG-ZINC PO) Take 1 tablet by mouth daily with breakfast.     Cholecalciferol (VITAMIN D3 SUPER STRENGTH) 50 MCG (2000 UT) CAPS Take 8,000 Units by mouth at bedtime.     dicyclomine (BENTYL) 10 MG capsule Take 1 capsule (10 mg total) by mouth every 6 (six) hours as needed (For abdominal cramping). 20 capsule 0   empagliflozin (JARDIANCE) 10 MG TABS tablet Take 1 tablet (10 mg total) by mouth daily before breakfast. 30 tablet 6   fexofenadine (ALLEGRA) 180 MG tablet Take 1 tablet (180 mg total) by mouth daily as needed for allergies or rhinitis.     gabapentin (NEURONTIN) 300 MG capsule Take 600-1,200 mg by mouth See admin instructions. Take 600 mg by mouth in the morning, 1,200 mg at bedtime, and an additional 600 mg once a day as needed for neuropathy  3   levothyroxine (SYNTHROID, LEVOTHROID) 175 MCG tablet Take 175 mcg by mouth daily before breakfast.  LORazepam (ATIVAN) 1 MG tablet Take 1 tablet (1 mg total) by mouth every 8 (eight) hours as needed (nausea and/or restlessness). 15 tablet 0   Menthol-Camphor (TIGER BALM ARTHRITIS RUB EX) Apply 1 application topically as needed (for leg pain).     montelukast (SINGULAIR) 10 MG tablet TAKE 1 TABLET(10 MG) BY MOUTH AT BEDTIME 90 tablet 1   Multiple Vitamin (MULTIVITAMIN WITH MINERALS) TABS tablet Take 1 tablet by mouth daily. 30 tablet 0   ondansetron (ZOFRAN-ODT) 4 MG disintegrating tablet Take 1 tablet (4 mg total) by mouth every 8 (eight) hours as needed for nausea or vomiting. 12 tablet 1   oxyCODONE-acetaminophen (PERCOCET/ROXICET) 5-325 MG tablet Take 1 tablet by mouth 2 (two) times daily.     OXYGEN Inhale 2 L/min into the lungs See admin instructions. 2 L/min at bedtime and 2 L/min throughout the day as needed for shortness of breath     oxymetazoline (AFRIN) 0.05 % nasal spray Place 1 spray into both nostrils as needed (for nose bleeds).     pantoprazole (PROTONIX) 40 MG tablet Take 40 mg by mouth daily before breakfast.      polyethylene glycol powder (GLYCOLAX/MIRALAX) 17 GM/SCOOP powder Take 17 g by mouth daily as needed for mild constipation.     potassium chloride SA (KLOR-CON) 20 MEQ tablet Take 20 mEq by mouth See admin instructions. Take 20 mEq by mouth two times a day on the days when Torsemide is taken     predniSONE (DELTASONE) 5 MG tablet Take 0.5 tablets (2.5 mg total) by mouth daily with breakfast. 30 tablet 5   promethazine (PHENERGAN) 25 MG tablet Take 1 tablet (25 mg total) by mouth every 6 (six) hours as needed for nausea or vomiting. 15 tablet 0   rOPINIRole (REQUIP) 1 MG tablet Take 1 tablet (1 mg total) by mouth in the morning and at bedtime.     rosuvastatin (CRESTOR) 10 MG tablet Take 10 mg by mouth at bedtime.      spironolactone (ALDACTONE) 25 MG tablet Take 1 tablet (25 mg total) by mouth daily. 90 tablet 3   SYSTANE ULTRA PF 0.4-0.3 % SOLN Apply 1 drop to eye 3 (three) times daily as needed (for dryness).     torsemide (DEMADEX) 20 MG tablet Take 30 mg by mouth every other day.     traMADol (ULTRAM) 50 MG tablet Take 100 mg by mouth 2 (two) times daily.     traZODone (DESYREL) 50 MG tablet Take 50 mg by mouth 2 (two) times daily.     No current facility-administered medications for this encounter.   Allergies  Allergen Reactions   Meloxicam Rash and Other (See Comments)    Red Man Syndrome, also   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 syndrome 09/02/15, resolved with diphenhydramine and slowing of rate   Social History   Socioeconomic History   Marital status: Married    Spouse name: Not on file   Number of children: Not on file   Years of education: Not on file   Highest education level: Not on file  Occupational History   Occupation: retired  Tobacco Use   Smoking status: Former    Packs/day: 1.00    Years: 25.00    Total pack years: 25.00    Types: Cigarettes    Start date: 1963    Quit date: 07/18/1991    Years since quitting: 30.3    Passive exposure: Never    Smokeless tobacco: Never  Vaping Use   Vaping Use: Never used  Substance and Sexual Activity   Alcohol use: Not Currently    Comment: occasional   Drug use: Yes    Types: Marijuana    Comment: routine use   Sexual activity: Not Currently  Other Topics Concern   Not on file  Social History Narrative   Lives in [place] with spouse.  Works as a Air traffic controller.   Social Determinants of Health   Financial Resource Strain: Not on file  Food Insecurity: Not on file  Transportation Needs: Not on file  Physical Activity: Not on file  Stress: Not on file  Social Connections: Not on file  Intimate Partner Violence: Not on file   Family History  Problem Relation Age of Onset   Heart failure Mother    Stroke Father    Neuropathy Neg Hx    BP (!) 144/72   Pulse (!) 59   Wt 82.1 kg (181 lb)   SpO2 90%   BMI 25.24 kg/m   Wt Readings from Last 3 Encounters:  11/09/21 82.1 kg (181 lb)  10/11/21 83.1 kg (183 lb 3.2 oz)  08/27/21 84.8 kg (187 lb)   PHYSICAL EXAM: General:  NAD. No resp difficulty, walked into clinic HEENT: Normal Neck: Supple. No JVD. Carotids 2+ bilat; no bruits. No lymphadenopathy or thryomegaly appreciated. Cor: PMI nondisplaced. Regular rate & rhythm. No rubs, gallops or murmurs. Lungs: Clear Abdomen: Soft, nontender, nondistended. No hepatosplenomegaly. No bruits or masses. Good bowel sounds. Extremities: No cyanosis, clubbing, rash, edema Neuro: Alert & oriented x 3, cranial nerves grossly intact. Moves all 4 extremities w/o difficulty. Affect pleasant.  ECG: AF/AFL, v-paced, 60 (personally reviewed).  Device Interrogation: >99% v-pacing (personally reviewed).  ASSESSMENT & PLAN: 1. Chronic diastolic CHF with RV failure - Echo (10/17): EF 60-65%, Grade 3 DD. Mild AI, Mild MR, RV mildly dilated and severely reduced. RA mildly dilated. PA peak pressure 74 mm Hg. Concerns for restrictive cardiomyopathy. - Echo (7/19): EF 60-65% AVR ok  - TEE  (6/21): EF 60% RV moderately HK septal flattening RVSP 90s - Echo (3/22): EF 55-60% RV ok. Mild MR/TR. Bioprosthetic AVR ok. Restrictive physiology. - Echo (2/23): EF 55-60%, normal RV, bioprosthetic AVR ok - Stable NYHA III. Volume status ok.  - Continue Jardiance 10 mg daily. - Continue torsemide 30 mg qod.  - Continue spironolactone 25 mg daily.   - Continue bisoprolol 5 mg daily. - Reinforced fluid restriction to < 2 L daily, sodium restriction to less than 2000 mg daily, and the importance of daily weights.   - Recent labs reviewed and OK; SCr 1.98, K 4.6 (10/22/21)  2. Persistent Afib s/p AF ablation x 2/Maze s/p pacemaker - TEE-DC-CV on 10/19/19 & DCCV on 02/25/20 - Repeat AF ablation 04/19/20. Returned to AF 04/21/20.  - DC-CV 05/25/20 -> AF - DC-CV 07/11/19. Back in AFL 3/22.  - Off Tikosyn. Per EP, not a candidate for further anti-arrhythmics or procedures. Now rate control strategy - Continue Eliquis 5 mg bid. - Recent CBC stable  3. AS s/p AVR - Stable on echo 3/22, AV mean gradient 7 mmHg - aware of need for SBE prophylaxis - Follow. Echo (2/23),mean gradient 11.0 mmHg     4 Pulmonary HTN - suspect pulmonary venous hypertension - RHC in 10/17 with pulmonary venous HTN primarily. No change. - TEE (6/21) EF 60% RV moderately HK septal flattening RVSP 90s in setting of COPD flare and PAF. - Echo (3/22) RV  looks better. TR insufficient to measure RVSP - Echo (2/23): EF 55-60%, RV ok, RVSP 32.4 mmHg, AV mean gradient 11.0 mmHg, AV peak gradient 20.8 mmHg, VTI 1.63 cm  5. Mild CAD by cath 01/2016 - Medical management.  - Continue Crestor 10 mg daily - No s/s of ischemia  6. Chronic respiratory failure with hypoxia - Continue with 2L home O2 at night. O2 90's today in clinic on room air. Advised putting O2 on when he gets home. - Follows with Pulmonary.  - May benefit from [employer]   7. HTN - Elevated today, but typically well-controlled. - I asked him to check BP at   home and log. - Continue current regimen.  8. DM2 - Contiue Jardiance.  - Last Hgb A1c 6.6 (2/23)  9. Valvular disease - severe TR mod MR on TEE 6/21 - mild on echo 3/22 - trivial on echo 2/23  10. 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 Moshe Cipro. Needs follow up soon. - last SCr 1. 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 Kathlene Cote,  - recent CT A/P stable.  Follow up in 6 months with Dr. Melynda Keller, FNP  11/09/21

## 2021-11-09 NOTE — Patient Instructions (Signed)
Thank you for coming in today  Labs were done today, if any labs are abnormal the clinic will call you No news is good news  CHECK blood pressure at home and monitor   NEEDS follow up with Dr. Moshe Cipro Nephrologist   Your physician recommends that you schedule a follow-up appointment in:  6 months with Dr. Haroldine Laws please call in November 2023 to make January 2024  At the [facility], you and your health needs are our priority. As part of our continuing mission to provide you with exceptional heart care, we have created designated Provider Care Teams. These Care Teams include your primary Cardiologist (physician) and Advanced Practice Providers (APPs- Physician Assistants and Nurse Practitioners) who all work together to provide you with the care you need, when you need it.   You may see any of the following providers on your designated Care Team at your next follow up: Dr Glori Bickers Dr Haynes Kerns, NP Lyda Jester, [place] [facility] [place], [place] Audry Riles, PharmD   Please be sure to bring in all your medications bottles to every appointment.   If you have any questions or concerns before your next appointment please send [place] a message through [place] or call our office at [phone].    TO LEAVE A MESSAGE FOR THE NURSE SELECT OPTION 2, PLEASE LEAVE A MESSAGE INCLUDING: YOUR NAME DATE OF BIRTH CALL BACK NUMBER REASON FOR CALL**this is important as we prioritize the call backs  YOU WILL RECEIVE A CALL BACK THE SAME DAY AS LONG AS YOU CALL BEFORE 4:00 PM

## 2021-11-19 ENCOUNTER — Other Ambulatory Visit (HOSPITAL_COMMUNITY): Payer: Self-pay

## 2021-11-19 MED ORDER — BISOPROLOL FUMARATE 5 MG PO TABS: 5.0000 mg | ORAL_TABLET | Freq: Every day | ORAL | 3 refills | Status: DC

## 2021-11-26 NOTE — Progress Notes (Signed)
Remote pacemaker transmission.   

## 2021-11-29 ENCOUNTER — Telehealth (HOSPITAL_COMMUNITY): Payer: Self-pay

## 2021-11-29 ENCOUNTER — Telehealth (HOSPITAL_COMMUNITY): Payer: Self-pay | Admitting: Pharmacy Technician

## 2021-11-29 NOTE — Telephone Encounter (Signed)
Patients wife called stating patient is now in the donut hole for medications and wanted to know if we could provide samples for patient. Patient also states that she called and left a message for [place] today as well to see if patient could get patient assistance for Eliquis. Samples of Eliquis were left up front for patient pick up.   Medication Samples have been provided to the patient.  Drug name: Eliquis       Strength: '5mg'$         Qty: 4  LOT: [id number]  Exp.Date: 07/2023  Dosing instructions: take 1 tab po bid  The patient has been instructed regarding the correct time, dose, and frequency of taking this medication, including desired effects and most common side effects.   Scot Shiraishi R Jesus Nevills 74:16 AM 11/29/2021

## 2021-11-29 NOTE — Telephone Encounter (Signed)
Advanced Heart Failure Patient Advocate Encounter  Spoke with patient's wife regarding Eliquis assistance through [employer]. She is aware of the 3% OOP requirement. Unfortunately, they have not met that at this time. A month of samples have been provided. Advised that we could provide samples intermittently but that it takes longer to meet the OOP requirement.   Charlann Boxer, CPhT

## 2021-12-03 ENCOUNTER — Ambulatory Visit (HOSPITAL_COMMUNITY)
Admission: RE | Admit: 2021-12-03 | Discharge: 2021-12-03 | Disposition: A | Discharge status: Home / Self Care | Payer: Medicare Other | Source: Ambulatory Visit | Attending: Surgery | Admitting: Surgery

## 2021-12-03 ENCOUNTER — Encounter: Payer: Self-pay | Admitting: Surgery

## 2021-12-03 ENCOUNTER — Ambulatory Visit: Payer: Medicare Other | Admitting: Surgery

## 2021-12-03 VITALS — BP 157/69 | HR 60 | Temp 98.1°F | Resp 20 | Ht 71.0 in | Wt 168.0 lb

## 2021-12-03 DIAGNOSIS — M79604 Pain in right leg: Secondary | ICD-10-CM | POA: Insufficient documentation

## 2021-12-03 DIAGNOSIS — M79605 Pain in left leg: Secondary | ICD-10-CM | POA: Insufficient documentation

## 2021-12-03 DIAGNOSIS — M7989 Other specified soft tissue disorders: Secondary | ICD-10-CM

## 2021-12-03 NOTE — Progress Notes (Signed)
[facility]  Patient name: [name] MRN: [id number] DOB: 02/01/1951 Sex: male   REASON FOR VISIT:    Follow up  HISOTRY OF PRESENT ILLNESS:    [name] is a 71 y.o. male who I met in May 2023 for evaluation of bilateral leg pain.  His pain has been going on for many years.  He had palpable pedal pulses and normal ABIs and toe pressures with triphasic waveforms.  I did not feel that his leg symptoms were consistent with arterial insufficiency.  The patient was complaining of leg swelling.  I recommended that he wear compression socks and follow-up today with a venous reflux ultrasound.  He had trouble tolerating the compression socks and did not wear them more than 1 week.  He still has his complaints of leg pain and numbness   He has a history of atrial fibrillation on Eliquis.  He has type 2 diabetes.  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  He has a pacemaker in place. PAST MEDICAL HISTORY:   Past Medical History:  Diagnosis Date   Aortic stenosis, mild    Arthritis of hand    "just a little bit in both hands" (03/31/2012)   Asthma    "little bit" (03/31/2012)   Atrial fibrillation ([place])    dx '04; DCCV '04, placed on flecainide, failed DCCV 04/2010, flecainide stopped; s/p successful A.fib ablation 01/31/12   CHF (congestive heart failure) ([place])    Controlled type 2 diabetes mellitus without complication, without long-term current use of insulin ([place]) 12/13/2019   COPD (chronic obstructive pulmonary disease) ([facility])    DJD (degenerative joint disease)    Fibromyalgia    GERD (gastroesophageal reflux disease)    Hyperlipidemia    Hypertension    Hypothyroidism    S/P radiation   Left atrial enlargement    LA size 63m by echo 11/21/11   Mitral regurgitation    trivial   Obstructive sleep apnea    mild by sleep study 2013; pt stated he does not have a machine because "it wasn't bad enough for  him to have one"   Pacemaker 03/31/2012   Restless leg syndrome    Second degree AV block    Splenomegaly    Visit for monitoring Tikosyn therapy 04/16/2016     FAMILY HISTORY:   Family History  Problem Relation Age of Onset   Heart failure Mother    Stroke Father    Neuropathy Neg Hx     SOCIAL HISTORY:   Social History   Tobacco Use   Smoking status: Former    Packs/day: 1.00    Years: 25.00    Total pack years: 25.00    Types: Cigarettes    Start date: 1963    Quit date: 07/18/1991    Years since quitting: 30.4    Passive exposure: Never   Smokeless tobacco: Never  Substance Use Topics   Alcohol use: Not Currently    Comment: occasional     ALLERGIES:   Allergies  Allergen Reactions   Meloxicam Rash and Other (See Comments)    Red Man Syndrome, also   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 syndrome 09/02/15, resolved with diphenhydramine and slowing of rate     CURRENT MEDICATIONS:   Current Outpatient Medications  Medication Sig Dispense Refill   albuterol (PROVENTIL HFA;VENTOLIN HFA) 108 (90 Base) MCG/ACT inhaler Inhale 2 puffs into the lungs every 6 (six) hours  as needed for wheezing or shortness of breath. 1 Inhaler 5   amLODipine (NORVASC) 5 MG tablet Take 2.5 mg by mouth daily.     apixaban (ELIQUIS) 5 MG TABS tablet Take 1 tablet (5 mg total) by mouth 2 (two) times daily. 60 tablet 6   bisoprolol (ZEBETA) 5 MG tablet Take 1 tablet (5 mg total) by mouth daily. Needs appt 90 tablet 3   Budeson-Glycopyrrol-Formoterol (BREZTRI AEROSPHERE) 160-9-4.8 MCG/ACT AERO Inhale 2 puffs into the lungs in the morning and at bedtime. 32.1 g 3   Calcium-Magnesium-Zinc (CAL-MAG-ZINC PO) Take 1 tablet by mouth daily with breakfast.     Cholecalciferol (VITAMIN D3 SUPER STRENGTH) 50 MCG (2000 UT) CAPS Take 8,000 Units by mouth at bedtime.     dicyclomine (BENTYL) 10 MG capsule Take 1 capsule (10 mg total) by mouth every 6 (six) hours as needed (For abdominal  cramping). 20 capsule 0   empagliflozin (JARDIANCE) 10 MG TABS tablet Take 1 tablet (10 mg total) by mouth daily before breakfast. 30 tablet 6   fexofenadine (ALLEGRA) 180 MG tablet Take 1 tablet (180 mg total) by mouth daily as needed for allergies or rhinitis.     gabapentin (NEURONTIN) 300 MG capsule Take 600-1,200 mg by mouth See admin instructions. Take 600 mg by mouth in the morning, 1,200 mg at bedtime, and an additional 600 mg once a day as needed for neuropathy  3   levothyroxine (SYNTHROID, LEVOTHROID) 175 MCG tablet Take 175 mcg by mouth daily before breakfast.     LORazepam (ATIVAN) 1 MG tablet Take 1 tablet (1 mg total) by mouth every 8 (eight) hours as needed (nausea and/or restlessness). 15 tablet 0   Menthol-Camphor (TIGER BALM ARTHRITIS RUB EX) Apply 1 application topically as needed (for leg pain).     montelukast (SINGULAIR) 10 MG tablet TAKE 1 TABLET(10 MG) BY MOUTH AT BEDTIME 90 tablet 1   Multiple Vitamin (MULTIVITAMIN WITH MINERALS) TABS tablet Take 1 tablet by mouth daily. 30 tablet 0   ondansetron (ZOFRAN-ODT) 4 MG disintegrating tablet Take 1 tablet (4 mg total) by mouth every 8 (eight) hours as needed for nausea or vomiting. 12 tablet 1   oxyCODONE-acetaminophen (PERCOCET/ROXICET) 5-325 MG tablet Take 1 tablet by mouth 2 (two) times daily.     OXYGEN Inhale 2 L/min into the lungs See admin instructions. 2 L/min at bedtime and 2 L/min throughout the day as needed for shortness of breath     oxymetazoline (AFRIN) 0.05 % nasal spray Place 1 spray into both nostrils as needed (for nose bleeds).     pantoprazole (PROTONIX) 40 MG tablet Take 40 mg by mouth daily before breakfast.     polyethylene glycol powder (GLYCOLAX/MIRALAX) 17 GM/SCOOP powder Take 17 g by mouth daily as needed for mild constipation.     potassium chloride SA (KLOR-CON) 20 MEQ tablet Take 20 mEq by mouth See admin instructions. Take 20 mEq by mouth two times a day on the days when Torsemide is taken      predniSONE (DELTASONE) 5 MG tablet Take 0.5 tablets (2.5 mg total) by mouth daily with breakfast. 30 tablet 5   promethazine (PHENERGAN) 25 MG tablet Take 1 tablet (25 mg total) by mouth every 6 (six) hours as needed for nausea or vomiting. 15 tablet 0   rOPINIRole (REQUIP) 1 MG tablet Take 1 tablet (1 mg total) by mouth in the morning and at bedtime.     rosuvastatin (CRESTOR) 10 MG tablet Take 10 mg by  mouth at bedtime.      spironolactone (ALDACTONE) 25 MG tablet Take 1 tablet (25 mg total) by mouth daily. 90 tablet 3   SYSTANE ULTRA PF 0.4-0.3 % SOLN Apply 1 drop to eye 3 (three) times daily as needed (for dryness).     torsemide (DEMADEX) 20 MG tablet Take 30 mg by mouth every other day.     traMADol (ULTRAM) 50 MG tablet Take 100 mg by mouth 2 (two) times daily.     traZODone (DESYREL) 50 MG tablet Take 50 mg by mouth 2 (two) times daily.     No current facility-administered medications for this visit.    REVIEW OF SYSTEMS:   '[X]'  denotes positive finding, '[ ]'  denotes negative finding Cardiac  Comments:  Chest pain or chest pressure:    Shortness of breath upon exertion:    Short of breath when lying flat:    Irregular heart rhythm:        Vascular    Pain in calf, thigh, or hip brought on by ambulation:    Pain in feet at night that wakes you up from your sleep:     Blood clot in your veins:    Leg swelling:         Pulmonary    Oxygen at home:    Productive cough:     Wheezing:         Neurologic    Sudden weakness in arms or legs:     Sudden numbness in arms or legs:     Sudden onset of difficulty speaking or slurred speech:    Temporary loss of vision in one eye:     Problems with dizziness:         Gastrointestinal    Blood in stool:     Vomited blood:         Genitourinary    Burning when urinating:     Blood in urine:        Psychiatric    Major depression:         Hematologic    Bleeding problems:    Problems with blood clotting too easily:         Skin    Rashes or ulcers:        Constitutional    Fever or chills:      PHYSICAL EXAM:   Vitals:   12/03/21 1353  BP: (!) 157/69  Pulse: 60  Resp: 20  Temp: 98.1 F (36.7 C)  SpO2: 95%  Weight: 168 lb (76.2 kg)  Height: '5\' 11"'  (1.803 m)    GENERAL: The patient is a well-nourished male, in no acute distress. The vital signs are documented above. CARDIAC: There is a regular rate and rhythm.  VASCULAR: Palpable bilateral dorsalis pedis pulse PULMONARY: Non-labored respirations MUSCULOSKELETAL: There are no major deformities or cyanosis. NEUROLOGIC: No focal weakness or paresthesias are detected. SKIN: There are no ulcers or rashes noted. PSYCHIATRIC: The patient has a normal affect.  STUDIES:   I have reviewed the following:  Venous Reflux Times  +--------------+---------+------+-----------+------------+----------------+   RIGHT         Reflux NoRefluxReflux TimeDiameter cmsComments                                    Yes                                             +--------------+---------+------+-----------+------------+----------------+  CFV                     yes   >1 second                                 +--------------+---------+------+-----------+------------+----------------+   FV prox       no                                                        +--------------+---------+------+-----------+------------+----------------+   FV mid        no                                                        +--------------+---------+------+-----------+------------+----------------+   FV dist       no                                                        +--------------+---------+------+-----------+------------+----------------+   Popliteal     no                                                        +--------------+---------+------+-----------+------------+----------------+   GSV at SFJ              yes     >500 ms     0.521                        +--------------+---------+------+-----------+------------+----------------+   GSV prox thighno                            0.27    chronic  thrombus  +--------------+---------+------+-----------+------------+----------------+   GSV mid thigh           yes    >500 ms      0.13                        +--------------+---------+------+-----------+------------+----------------+   GSV dist thighno                           0.135                        +--------------+---------+------+-----------+------------+----------------+   GSV at knee   no                            0.07                        +--------------+---------+------+-----------+------------+----------------+   GSV prox calf no  0.137                        +--------------+---------+------+-----------+------------+----------------+   SSV Pop Fossa           yes    >500 ms      0.41                        +--------------+---------+------+-----------+------------+----------------+   SSV prox calf no                            0.28    chronic  thrombus  +--------------+---------+------+-----------+------------+----------------+   SSV mid calf  no                            0.18    chronic  thrombus  +--------------+---------+------+-----------+------------+----------------+   MEDICAL ISSUES:   Leg swelling: Ultrasound today does not show any significant correctable venous insufficiency.  Therefore options for treatment of leg swelling remain compression therapy and elevation  Leg pain: Based off physical exam and ultrasound results from 3 months ago, he does not have any evidence of arterial insufficiency, therefore I believe that his leg issues are more neuropathic in origin.    Leia Alf, MD, FACS [facility] [phone] Pager [phone]

## 2021-12-04 ENCOUNTER — Other Ambulatory Visit (HOSPITAL_COMMUNITY): Payer: Self-pay | Admitting: Registered Nurse

## 2021-12-04 ENCOUNTER — Ambulatory Visit (HOSPITAL_COMMUNITY)
Admission: RE | Admit: 2021-12-04 | Discharge: 2021-12-04 | Disposition: A | Discharge status: Home / Self Care | Payer: Medicare Other | Source: Ambulatory Visit | Attending: Registered Nurse | Admitting: Registered Nurse

## 2021-12-04 DIAGNOSIS — R110 Nausea: Secondary | ICD-10-CM

## 2021-12-04 DIAGNOSIS — R1084 Generalized abdominal pain: Secondary | ICD-10-CM

## 2021-12-06 ENCOUNTER — Telehealth (HOSPITAL_COMMUNITY): Payer: Self-pay | Admitting: *Deleted

## 2021-12-06 NOTE — Telephone Encounter (Signed)
Received fax from [facility], pt needs cervical-interlaminar, c6-c7 and clearance to hold Eliquis 3 days prior  Per Dr Vaughan Browner: "medication may be discontinued & resumed as stated above"  Form faxed back t [phone]

## 2021-12-11 ENCOUNTER — Other Ambulatory Visit: Payer: Self-pay | Admitting: Gastroenterology

## 2021-12-18 NOTE — Progress Notes (Signed)
Remote reviewed. Battery status noted.  Leads function stable

## 2021-12-27 ENCOUNTER — Inpatient Hospital Stay (HOSPITAL_COMMUNITY)
Admission: EM | Admit: 2021-12-27 | Discharge: 2022-01-02 | DRG: 193 | Disposition: A | Discharge status: Home / Self Care | Payer: Medicare Other | Attending: Internal Medicine | Admitting: Internal Medicine

## 2021-12-27 ENCOUNTER — Other Ambulatory Visit: Payer: Self-pay

## 2021-12-27 ENCOUNTER — Emergency Department (HOSPITAL_COMMUNITY): Payer: Medicare Other

## 2021-12-27 DIAGNOSIS — J189 Pneumonia, unspecified organism: Secondary | ICD-10-CM | POA: Diagnosis not present

## 2021-12-27 DIAGNOSIS — I272 Pulmonary hypertension, unspecified: Secondary | ICD-10-CM | POA: Diagnosis present

## 2021-12-27 DIAGNOSIS — I442 Atrioventricular block, complete: Secondary | ICD-10-CM | POA: Diagnosis present

## 2021-12-27 DIAGNOSIS — G2581 Restless legs syndrome: Secondary | ICD-10-CM | POA: Diagnosis present

## 2021-12-27 DIAGNOSIS — Z8249 Family history of ischemic heart disease and other diseases of the circulatory system: Secondary | ICD-10-CM

## 2021-12-27 DIAGNOSIS — J440 Chronic obstructive pulmonary disease with acute lower respiratory infection: Secondary | ICD-10-CM | POA: Diagnosis present

## 2021-12-27 DIAGNOSIS — Z7951 Long term (current) use of inhaled steroids: Secondary | ICD-10-CM

## 2021-12-27 DIAGNOSIS — Z79891 Long term (current) use of opiate analgesic: Secondary | ICD-10-CM

## 2021-12-27 DIAGNOSIS — I4821 Permanent atrial fibrillation: Secondary | ICD-10-CM | POA: Diagnosis present

## 2021-12-27 DIAGNOSIS — G4733 Obstructive sleep apnea (adult) (pediatric): Secondary | ICD-10-CM | POA: Diagnosis present

## 2021-12-27 DIAGNOSIS — J9621 Acute and chronic respiratory failure with hypoxia: Secondary | ICD-10-CM

## 2021-12-27 DIAGNOSIS — Z881 Allergy status to other antibiotic agents status: Secondary | ICD-10-CM

## 2021-12-27 DIAGNOSIS — Z7984 Long term (current) use of oral hypoglycemic drugs: Secondary | ICD-10-CM

## 2021-12-27 DIAGNOSIS — E119 Type 2 diabetes mellitus without complications: Secondary | ICD-10-CM

## 2021-12-27 DIAGNOSIS — Z9981 Dependence on supplemental oxygen: Secondary | ICD-10-CM

## 2021-12-27 DIAGNOSIS — Z87891 Personal history of nicotine dependence: Secondary | ICD-10-CM

## 2021-12-27 DIAGNOSIS — Z7901 Long term (current) use of anticoagulants: Secondary | ICD-10-CM

## 2021-12-27 DIAGNOSIS — I130 Hypertensive heart and chronic kidney disease with heart failure and stage 1 through stage 4 chronic kidney disease, or unspecified chronic kidney disease: Secondary | ICD-10-CM | POA: Diagnosis present

## 2021-12-27 DIAGNOSIS — Z952 Presence of prosthetic heart valve: Secondary | ICD-10-CM

## 2021-12-27 DIAGNOSIS — Z950 Presence of cardiac pacemaker: Secondary | ICD-10-CM

## 2021-12-27 DIAGNOSIS — K8020 Calculus of gallbladder without cholecystitis without obstruction: Secondary | ICD-10-CM | POA: Diagnosis present

## 2021-12-27 DIAGNOSIS — Z886 Allergy status to analgesic agent status: Secondary | ICD-10-CM

## 2021-12-27 DIAGNOSIS — I10 Essential (primary) hypertension: Secondary | ICD-10-CM | POA: Diagnosis present

## 2021-12-27 DIAGNOSIS — Z79890 Hormone replacement therapy (postmenopausal): Secondary | ICD-10-CM

## 2021-12-27 DIAGNOSIS — I5032 Chronic diastolic (congestive) heart failure: Secondary | ICD-10-CM | POA: Diagnosis present

## 2021-12-27 DIAGNOSIS — Z923 Personal history of irradiation: Secondary | ICD-10-CM

## 2021-12-27 DIAGNOSIS — M19042 Primary osteoarthritis, left hand: Secondary | ICD-10-CM | POA: Diagnosis present

## 2021-12-27 DIAGNOSIS — E785 Hyperlipidemia, unspecified: Secondary | ICD-10-CM | POA: Diagnosis present

## 2021-12-27 DIAGNOSIS — K5730 Diverticulosis of large intestine without perforation or abscess without bleeding: Secondary | ICD-10-CM | POA: Diagnosis present

## 2021-12-27 DIAGNOSIS — K640 First degree hemorrhoids: Secondary | ICD-10-CM | POA: Diagnosis present

## 2021-12-27 DIAGNOSIS — M797 Fibromyalgia: Secondary | ICD-10-CM | POA: Diagnosis present

## 2021-12-27 DIAGNOSIS — K219 Gastro-esophageal reflux disease without esophagitis: Secondary | ICD-10-CM | POA: Diagnosis present

## 2021-12-27 DIAGNOSIS — J449 Chronic obstructive pulmonary disease, unspecified: Secondary | ICD-10-CM | POA: Diagnosis present

## 2021-12-27 DIAGNOSIS — Z79899 Other long term (current) drug therapy: Secondary | ICD-10-CM

## 2021-12-27 DIAGNOSIS — R109 Unspecified abdominal pain: Secondary | ICD-10-CM | POA: Diagnosis present

## 2021-12-27 DIAGNOSIS — E1122 Type 2 diabetes mellitus with diabetic chronic kidney disease: Secondary | ICD-10-CM | POA: Diagnosis present

## 2021-12-27 DIAGNOSIS — I2510 Atherosclerotic heart disease of native coronary artery without angina pectoris: Secondary | ICD-10-CM | POA: Diagnosis present

## 2021-12-27 DIAGNOSIS — Z20822 Contact with and (suspected) exposure to covid-19: Secondary | ICD-10-CM | POA: Diagnosis present

## 2021-12-27 DIAGNOSIS — E039 Hypothyroidism, unspecified: Secondary | ICD-10-CM | POA: Diagnosis present

## 2021-12-27 DIAGNOSIS — K5900 Constipation, unspecified: Secondary | ICD-10-CM

## 2021-12-27 DIAGNOSIS — I480 Paroxysmal atrial fibrillation: Secondary | ICD-10-CM | POA: Diagnosis present

## 2021-12-27 DIAGNOSIS — Z7952 Long term (current) use of systemic steroids: Secondary | ICD-10-CM

## 2021-12-27 DIAGNOSIS — M19041 Primary osteoarthritis, right hand: Secondary | ICD-10-CM | POA: Diagnosis present

## 2021-12-27 DIAGNOSIS — R0789 Other chest pain: Secondary | ICD-10-CM | POA: Diagnosis not present

## 2021-12-27 DIAGNOSIS — K4090 Unilateral inguinal hernia, without obstruction or gangrene, not specified as recurrent: Secondary | ICD-10-CM | POA: Diagnosis present

## 2021-12-27 DIAGNOSIS — N1832 Chronic kidney disease, stage 3b: Secondary | ICD-10-CM | POA: Diagnosis present

## 2021-12-27 LAB — CBC
HCT: 46.4 % (ref 39.0–52.0)
Hemoglobin: 14.7 g/dL (ref 13.0–17.0)
MCH: 28.9 pg (ref 26.0–34.0)
MCHC: 31.7 g/dL (ref 30.0–36.0)
MCV: 91.2 fL (ref 80.0–100.0)
Platelets: 166 10*3/uL (ref 150–400)
RBC: 5.09 MIL/uL (ref 4.22–5.81)
RDW: 17.7 % — ABNORMAL HIGH (ref 11.5–15.5)
WBC: 21.9 10*3/uL — ABNORMAL HIGH (ref 4.0–10.5)
nRBC: 0 % (ref 0.0–0.2)

## 2021-12-27 MED ORDER — IPRATROPIUM-ALBUTEROL 0.5-2.5 (3) MG/3ML IN SOLN
3.0000 mL | Freq: Once | RESPIRATORY_TRACT | Status: AC
  Administered 2021-12-28: 3 mL via RESPIRATORY_TRACT
  Filled 2021-12-27: qty 3

## 2021-12-27 NOTE — ED Provider Triage Note (Signed)
Emergency Medicine Provider Triage Evaluation Note  [name] , a 71 y.o. male  was evaluated in triage.  Pt complains of chest pain and abdominal pain.  Patient states that he has had central chest pressure that has been constant and worsening since yesterday.  States that it does not radiate.  He has had associated shortness of breath and palpitations.  Denies lightheadedness or dizziness.  States he has also had abdominal pain that started today.  He has had associated nausea without vomiting.  He denies fever.  He has chronic cough from emphysema however states that it appears increased from baseline.  Review of Systems  Positive: See above Negative:   Physical Exam  BP 125/61 (BP Location: Right Arm)   Pulse 60   Temp 98 F (36.7 C) (Oral)   Resp 18   SpO2 96%  Gen:   Awake, no distress   Resp:  Normal effort, wheezing bilaterally MSK:   Moves extremities without difficulty  Other:    Medical Decision Making  Medically screening exam initiated at 11:09 PM.  Appropriate orders placed.  BRAYLYNN GHAN was informed that the remainder of the evaluation will be completed by another provider, this initial triage assessment does not replace that evaluation, and the importance of remaining in the ED until their evaluation is complete.     Mickie Hillier, PA-C 12/27/21 2310

## 2021-12-27 NOTE — ED Triage Notes (Signed)
Pt with right sided chest pain since yesterday.  Worse tonight.  Pt with nausea but no vomiting.  Pt on 3L [place] at home for COPD.

## 2021-12-28 ENCOUNTER — Emergency Department (HOSPITAL_COMMUNITY): Payer: Medicare Other

## 2021-12-28 ENCOUNTER — Encounter (HOSPITAL_COMMUNITY): Payer: Self-pay

## 2021-12-28 DIAGNOSIS — N1832 Chronic kidney disease, stage 3b: Secondary | ICD-10-CM | POA: Diagnosis present

## 2021-12-28 DIAGNOSIS — I130 Hypertensive heart and chronic kidney disease with heart failure and stage 1 through stage 4 chronic kidney disease, or unspecified chronic kidney disease: Secondary | ICD-10-CM | POA: Diagnosis present

## 2021-12-28 DIAGNOSIS — M19042 Primary osteoarthritis, left hand: Secondary | ICD-10-CM | POA: Diagnosis present

## 2021-12-28 DIAGNOSIS — J189 Pneumonia, unspecified organism: Secondary | ICD-10-CM | POA: Diagnosis present

## 2021-12-28 DIAGNOSIS — Z952 Presence of prosthetic heart valve: Secondary | ICD-10-CM | POA: Diagnosis not present

## 2021-12-28 DIAGNOSIS — I110 Hypertensive heart disease with heart failure: Secondary | ICD-10-CM | POA: Diagnosis not present

## 2021-12-28 DIAGNOSIS — I4821 Permanent atrial fibrillation: Secondary | ICD-10-CM | POA: Diagnosis present

## 2021-12-28 DIAGNOSIS — I442 Atrioventricular block, complete: Secondary | ICD-10-CM | POA: Diagnosis present

## 2021-12-28 DIAGNOSIS — K529 Noninfective gastroenteritis and colitis, unspecified: Secondary | ICD-10-CM | POA: Diagnosis not present

## 2021-12-28 DIAGNOSIS — J440 Chronic obstructive pulmonary disease with acute lower respiratory infection: Secondary | ICD-10-CM | POA: Diagnosis present

## 2021-12-28 DIAGNOSIS — I2510 Atherosclerotic heart disease of native coronary artery without angina pectoris: Secondary | ICD-10-CM | POA: Diagnosis present

## 2021-12-28 DIAGNOSIS — I5032 Chronic diastolic (congestive) heart failure: Secondary | ICD-10-CM | POA: Diagnosis present

## 2021-12-28 DIAGNOSIS — K5900 Constipation, unspecified: Secondary | ICD-10-CM | POA: Diagnosis present

## 2021-12-28 DIAGNOSIS — I509 Heart failure, unspecified: Secondary | ICD-10-CM | POA: Diagnosis not present

## 2021-12-28 DIAGNOSIS — G2581 Restless legs syndrome: Secondary | ICD-10-CM | POA: Diagnosis present

## 2021-12-28 DIAGNOSIS — I272 Pulmonary hypertension, unspecified: Secondary | ICD-10-CM | POA: Diagnosis present

## 2021-12-28 DIAGNOSIS — K5730 Diverticulosis of large intestine without perforation or abscess without bleeding: Secondary | ICD-10-CM | POA: Diagnosis present

## 2021-12-28 DIAGNOSIS — I480 Paroxysmal atrial fibrillation: Secondary | ICD-10-CM | POA: Diagnosis not present

## 2021-12-28 DIAGNOSIS — Z20822 Contact with and (suspected) exposure to covid-19: Secondary | ICD-10-CM | POA: Diagnosis present

## 2021-12-28 DIAGNOSIS — K8020 Calculus of gallbladder without cholecystitis without obstruction: Secondary | ICD-10-CM | POA: Diagnosis present

## 2021-12-28 DIAGNOSIS — E039 Hypothyroidism, unspecified: Secondary | ICD-10-CM | POA: Diagnosis present

## 2021-12-28 DIAGNOSIS — R0789 Other chest pain: Secondary | ICD-10-CM | POA: Diagnosis present

## 2021-12-28 DIAGNOSIS — E1122 Type 2 diabetes mellitus with diabetic chronic kidney disease: Secondary | ICD-10-CM | POA: Diagnosis present

## 2021-12-28 DIAGNOSIS — J9621 Acute and chronic respiratory failure with hypoxia: Secondary | ICD-10-CM | POA: Diagnosis present

## 2021-12-28 DIAGNOSIS — R1032 Left lower quadrant pain: Secondary | ICD-10-CM | POA: Diagnosis not present

## 2021-12-28 DIAGNOSIS — K219 Gastro-esophageal reflux disease without esophagitis: Secondary | ICD-10-CM | POA: Diagnosis present

## 2021-12-28 DIAGNOSIS — K6389 Other specified diseases of intestine: Secondary | ICD-10-CM | POA: Diagnosis not present

## 2021-12-28 DIAGNOSIS — K648 Other hemorrhoids: Secondary | ICD-10-CM | POA: Diagnosis not present

## 2021-12-28 DIAGNOSIS — R1013 Epigastric pain: Secondary | ICD-10-CM | POA: Diagnosis not present

## 2021-12-28 DIAGNOSIS — K640 First degree hemorrhoids: Secondary | ICD-10-CM | POA: Diagnosis present

## 2021-12-28 DIAGNOSIS — E785 Hyperlipidemia, unspecified: Secondary | ICD-10-CM | POA: Diagnosis present

## 2021-12-28 DIAGNOSIS — M19041 Primary osteoarthritis, right hand: Secondary | ICD-10-CM | POA: Diagnosis present

## 2021-12-28 DIAGNOSIS — K4090 Unilateral inguinal hernia, without obstruction or gangrene, not specified as recurrent: Secondary | ICD-10-CM | POA: Diagnosis present

## 2021-12-28 LAB — URINALYSIS, ROUTINE W REFLEX MICROSCOPIC
Bacteria, UA: NONE SEEN
Bilirubin Urine: NEGATIVE
Glucose, UA: >=500 mg/dL — AB
Hgb urine dipstick: NEGATIVE
Ketones, ur: 5 mg/dL — AB
Leukocytes,Ua: NEGATIVE
Nitrite: NEGATIVE
Protein, ur: 30 mg/dL — AB
Specific Gravity, Urine: 1.032 — ABNORMAL HIGH (ref 1.005–1.030)
pH: 6 (ref 5.0–8.0)

## 2021-12-28 LAB — COMPREHENSIVE METABOLIC PANEL
ALT: 17 U/L (ref 0–44)
AST: 20 U/L (ref 15–41)
Albumin: 4.1 g/dL (ref 3.5–5.0)
Alkaline Phosphatase: 84 U/L (ref 38–126)
Anion gap: 8 (ref 5–15)
BUN: 27 mg/dL — ABNORMAL HIGH (ref 8–23)
CO2: 25 mmol/L (ref 22–32)
Calcium: 9.5 mg/dL (ref 8.9–10.3)
Chloride: 104 mmol/L (ref 98–111)
Creatinine, Ser: 1.79 mg/dL — ABNORMAL HIGH (ref 0.61–1.24)
GFR, Estimated: 40 mL/min — ABNORMAL LOW (ref >60)
Glucose, Bld: 141 mg/dL — ABNORMAL HIGH (ref 70–99)
Potassium: 4.4 mmol/L (ref 3.5–5.1)
Sodium: 137 mmol/L (ref 135–145)
Total Bilirubin: 1.7 mg/dL — ABNORMAL HIGH (ref 0.3–1.2)
Total Protein: 7.4 g/dL (ref 6.5–8.1)

## 2021-12-28 LAB — TROPONIN I (HIGH SENSITIVITY)
Troponin I (High Sensitivity): 18 ng/L — ABNORMAL HIGH (ref <18)
Troponin I (High Sensitivity): 18 ng/L — ABNORMAL HIGH (ref <18)

## 2021-12-28 LAB — HEMOGLOBIN A1C
Hgb A1c MFr Bld: 6.5 % — ABNORMAL HIGH (ref 4.8–5.6)
Mean Plasma Glucose: 139.85 mg/dL

## 2021-12-28 LAB — LIPASE, BLOOD: Lipase: 26 U/L (ref 11–51)

## 2021-12-28 LAB — RESP PANEL BY RT-PCR (FLU A&B, COVID) ARPGX2
Influenza A by PCR: NEGATIVE
Influenza B by PCR: NEGATIVE
SARS Coronavirus 2 by RT PCR: NEGATIVE

## 2021-12-28 LAB — HEPARIN LEVEL (UNFRACTIONATED): Heparin Unfractionated: 0.24 IU/mL — ABNORMAL LOW (ref 0.30–0.70)

## 2021-12-28 LAB — APTT
aPTT: 34 seconds (ref 24–36)
aPTT: 61 seconds — ABNORMAL HIGH (ref 24–36)

## 2021-12-28 LAB — PROTIME-INR
INR: 1.4 — ABNORMAL HIGH (ref 0.8–1.2)
Prothrombin Time: 17.3 seconds — ABNORMAL HIGH (ref 11.4–15.2)

## 2021-12-28 LAB — GLUCOSE, CAPILLARY
Glucose-Capillary: 140 mg/dL — ABNORMAL HIGH (ref 70–99)
Glucose-Capillary: 135 mg/dL — ABNORMAL HIGH (ref 70–99)

## 2021-12-28 MED ORDER — SPIRONOLACTONE 25 MG PO TABS
25.0000 mg | ORAL_TABLET | Freq: Every day | ORAL | Status: DC
  Administered 2021-12-28 – 2021-12-31 (×4): 25 mg via ORAL
  Filled 2021-12-28 (×4): qty 1

## 2021-12-28 MED ORDER — GABAPENTIN 300 MG PO CAPS: 600.0000 mg | ORAL_CAPSULE | ORAL | Status: DC

## 2021-12-28 MED ORDER — HEPARIN (PORCINE) 25000 UT/250ML-% IV SOLN
1350.0000 [IU]/h | INTRAVENOUS | Status: AC
  Administered 2021-12-28 – 2021-12-29 (×2): 1200 [IU]/h via INTRAVENOUS
  Administered 2021-12-30: 1350 [IU]/h via INTRAVENOUS
  Filled 2021-12-28 (×2): qty 250

## 2021-12-28 MED ORDER — HEPARIN BOLUS VIA INFUSION
1000.0000 [IU] | Freq: Once | INTRAVENOUS | Status: AC
  Administered 2021-12-28: 1000 [IU] via INTRAVENOUS
  Filled 2021-12-28: qty 1000

## 2021-12-28 MED ORDER — MOMETASONE FURO-FORMOTEROL FUM 200-5 MCG/ACT IN AERO
2.0000 | INHALATION_SPRAY | Freq: Two times a day (BID) | RESPIRATORY_TRACT | Status: DC
Start: 2021-12-28 — End: 2022-01-02
  Administered 2021-12-28 – 2022-01-02 (×10): 2 via RESPIRATORY_TRACT
  Filled 2021-12-28: qty 8.8

## 2021-12-28 MED ORDER — POLYETHYL GLYC-PROPYL GLYC PF 0.4-0.3 % OP SOLN
1.0000 [drp] | Freq: Three times a day (TID) | OPHTHALMIC | Status: DC | PRN
Start: 2021-12-28 — End: 2021-12-28

## 2021-12-28 MED ORDER — OXYCODONE HCL 5 MG PO TABS
10.0000 mg | ORAL_TABLET | ORAL | Status: DC | PRN
  Administered 2021-12-28 – 2022-01-01 (×8): 10 mg via ORAL
  Filled 2021-12-28 (×8): qty 2

## 2021-12-28 MED ORDER — SODIUM CHLORIDE (PF) 0.9 % IJ SOLN
INTRAMUSCULAR | Status: AC
  Filled 2021-12-28: qty 50

## 2021-12-28 MED ORDER — ONDANSETRON HCL 4 MG PO TABS: 4.0000 mg | ORAL_TABLET | Freq: Four times a day (QID) | ORAL | Status: DC | PRN

## 2021-12-28 MED ORDER — POLYETHYLENE GLYCOL 3350 17 G PO PACK
17.0000 g | PACK | Freq: Every day | ORAL | Status: DC
  Administered 2021-12-31: 17 g via ORAL
  Filled 2021-12-28 (×2): qty 1

## 2021-12-28 MED ORDER — AZITHROMYCIN 250 MG PO TABS
500.0000 mg | ORAL_TABLET | Freq: Every day | ORAL | Status: AC
  Administered 2021-12-29 – 2022-01-01 (×4): 500 mg via ORAL
  Filled 2021-12-28 (×4): qty 2

## 2021-12-28 MED ORDER — INSULIN ASPART 100 UNIT/ML IJ SOLN: 0.0000 [IU] | Freq: Every day | INTRAMUSCULAR | Status: DC

## 2021-12-28 MED ORDER — IOHEXOL 300 MG/ML  SOLN
80.0000 mL | Freq: Once | INTRAMUSCULAR | Status: AC | PRN
  Administered 2021-12-28: 80 mL via INTRAVENOUS

## 2021-12-28 MED ORDER — ACETAMINOPHEN 650 MG RE SUPP: 650.0000 mg | Freq: Four times a day (QID) | RECTAL | Status: DC | PRN

## 2021-12-28 MED ORDER — GABAPENTIN 300 MG PO CAPS: 600.0000 mg | ORAL_CAPSULE | Freq: Every day | ORAL | Status: DC | PRN

## 2021-12-28 MED ORDER — ADULT MULTIVITAMIN W/MINERALS CH
1.0000 | ORAL_TABLET | Freq: Every day | ORAL | Status: DC
  Administered 2021-12-28 – 2022-01-02 (×6): 1 via ORAL
  Filled 2021-12-28 (×6): qty 1

## 2021-12-28 MED ORDER — ROSUVASTATIN CALCIUM 10 MG PO TABS
10.0000 mg | ORAL_TABLET | Freq: Every day | ORAL | Status: DC
  Administered 2021-12-28 – 2022-01-01 (×5): 10 mg via ORAL
  Filled 2021-12-28 (×5): qty 1

## 2021-12-28 MED ORDER — HEPARIN (PORCINE) 25000 UT/250ML-% IV SOLN
1050.0000 [IU]/h | INTRAVENOUS | Status: DC
  Administered 2021-12-28: 1050 [IU]/h via INTRAVENOUS
  Filled 2021-12-28: qty 250

## 2021-12-28 MED ORDER — PANTOPRAZOLE SODIUM 40 MG PO TBEC
40.0000 mg | DELAYED_RELEASE_TABLET | Freq: Every day | ORAL | Status: DC
  Administered 2021-12-29 – 2022-01-02 (×5): 40 mg via ORAL
  Filled 2021-12-28 (×5): qty 1

## 2021-12-28 MED ORDER — HEPARIN BOLUS VIA INFUSION
4500.0000 [IU] | Freq: Once | INTRAVENOUS | Status: AC
  Administered 2021-12-28: 4500 [IU] via INTRAVENOUS
  Filled 2021-12-28: qty 4500

## 2021-12-28 MED ORDER — SODIUM CHLORIDE 0.9 % IV SOLN
500.0000 mg | Freq: Once | INTRAVENOUS | Status: AC
  Administered 2021-12-28: 500 mg via INTRAVENOUS
  Filled 2021-12-28: qty 5

## 2021-12-28 MED ORDER — AMLODIPINE BESYLATE 5 MG PO TABS
2.5000 mg | ORAL_TABLET | Freq: Every day | ORAL | Status: DC
  Administered 2021-12-28 – 2022-01-02 (×6): 2.5 mg via ORAL
  Filled 2021-12-28 (×6): qty 1

## 2021-12-28 MED ORDER — TRAZODONE HCL 50 MG PO TABS
50.0000 mg | ORAL_TABLET | Freq: Two times a day (BID) | ORAL | Status: DC
  Administered 2021-12-28 – 2022-01-02 (×10): 50 mg via ORAL
  Filled 2021-12-28 (×10): qty 1

## 2021-12-28 MED ORDER — HYDROMORPHONE HCL 1 MG/ML IJ SOLN
0.5000 mg | INTRAMUSCULAR | Status: DC | PRN
  Administered 2021-12-28 – 2021-12-30 (×4): 0.5 mg via INTRAVENOUS
  Filled 2021-12-28 (×4): qty 0.5

## 2021-12-28 MED ORDER — UMECLIDINIUM BROMIDE 62.5 MCG/ACT IN AEPB
1.0000 | INHALATION_SPRAY | Freq: Every day | RESPIRATORY_TRACT | Status: DC
Start: 2021-12-29 — End: 2022-01-02
  Administered 2021-12-29 – 2022-01-02 (×5): 1 via RESPIRATORY_TRACT
  Filled 2021-12-28: qty 7

## 2021-12-28 MED ORDER — TORSEMIDE 20 MG PO TABS
30.0000 mg | ORAL_TABLET | ORAL | Status: DC
  Administered 2021-12-28 – 2021-12-30 (×2): 30 mg via ORAL
  Filled 2021-12-28 (×2): qty 1

## 2021-12-28 MED ORDER — HYDROMORPHONE HCL 2 MG/ML IJ SOLN
1.0000 mg | Freq: Once | INTRAMUSCULAR | Status: AC
  Administered 2021-12-28: 1 mg via INTRAVENOUS
  Filled 2021-12-28: qty 1

## 2021-12-28 MED ORDER — SODIUM CHLORIDE 0.9 % IV SOLN
1.0000 g | Freq: Once | INTRAVENOUS | Status: AC
  Administered 2021-12-28: 1 g via INTRAVENOUS

## 2021-12-28 MED ORDER — GABAPENTIN 400 MG PO CAPS
1200.0000 mg | ORAL_CAPSULE | Freq: Every day | ORAL | Status: DC
  Administered 2021-12-28 – 2022-01-01 (×5): 1200 mg via ORAL
  Filled 2021-12-28 (×5): qty 3

## 2021-12-28 MED ORDER — CEFTRIAXONE SODIUM 1 G IJ SOLR: 1.0000 g | Freq: Once | INTRAMUSCULAR | Status: DC

## 2021-12-28 MED ORDER — SENNA 8.6 MG PO TABS
1.0000 | ORAL_TABLET | Freq: Two times a day (BID) | ORAL | Status: DC
Start: 2021-12-28 — End: 2022-01-02
  Administered 2021-12-28 – 2022-01-02 (×10): 8.6 mg via ORAL
  Filled 2021-12-28 (×10): qty 1

## 2021-12-28 MED ORDER — MONTELUKAST SODIUM 10 MG PO TABS
10.0000 mg | ORAL_TABLET | Freq: Every day | ORAL | Status: DC
  Administered 2021-12-28 – 2022-01-01 (×5): 10 mg via ORAL
  Filled 2021-12-28 (×5): qty 1

## 2021-12-28 MED ORDER — GABAPENTIN 300 MG PO CAPS
600.0000 mg | ORAL_CAPSULE | Freq: Every day | ORAL | Status: DC
  Administered 2021-12-29 – 2022-01-02 (×5): 600 mg via ORAL
  Filled 2021-12-28 (×5): qty 2

## 2021-12-28 MED ORDER — SODIUM CHLORIDE 0.9 % IV SOLN: 2.0000 g | INTRAVENOUS | Status: DC

## 2021-12-28 MED ORDER — MOMETASONE FURO-FORMOTEROL FUM 200-5 MCG/ACT IN AERO
2.0000 | INHALATION_SPRAY | Freq: Two times a day (BID) | RESPIRATORY_TRACT | Status: DC
  Filled 2021-12-28: qty 8.8

## 2021-12-28 MED ORDER — PEG 3350-KCL-NA BICARB-NACL 420 G PO SOLR
4000.0000 mL | Freq: Once | ORAL | Status: AC
  Administered 2021-12-28: 4000 mL via ORAL
  Filled 2021-12-28: qty 4000

## 2021-12-28 MED ORDER — ROPINIROLE HCL 1 MG PO TABS
1.0000 mg | ORAL_TABLET | Freq: Two times a day (BID) | ORAL | Status: DC
  Administered 2021-12-28 – 2022-01-02 (×10): 1 mg via ORAL
  Filled 2021-12-28 (×10): qty 1

## 2021-12-28 MED ORDER — GUAIFENESIN ER 600 MG PO TB12
600.0000 mg | ORAL_TABLET | Freq: Two times a day (BID) | ORAL | Status: DC
  Administered 2021-12-28 – 2022-01-02 (×11): 600 mg via ORAL
  Filled 2021-12-28 (×11): qty 1

## 2021-12-28 MED ORDER — ONDANSETRON HCL 4 MG/2ML IJ SOLN
4.0000 mg | Freq: Once | INTRAMUSCULAR | Status: AC
  Administered 2021-12-28: 4 mg via INTRAVENOUS
  Filled 2021-12-28: qty 2

## 2021-12-28 MED ORDER — BUDESON-GLYCOPYRROL-FORMOTEROL 160-9-4.8 MCG/ACT IN AERO
2.0000 | INHALATION_SPRAY | Freq: Two times a day (BID) | RESPIRATORY_TRACT | Status: DC
Start: 2021-12-28 — End: 2021-12-28

## 2021-12-28 MED ORDER — ALBUTEROL SULFATE (2.5 MG/3ML) 0.083% IN NEBU: 2.5000 mg | INHALATION_SOLUTION | RESPIRATORY_TRACT | Status: DC | PRN

## 2021-12-28 MED ORDER — LEVOTHYROXINE SODIUM 25 MCG PO TABS
175.0000 ug | ORAL_TABLET | Freq: Every day | ORAL | Status: DC
Start: 2021-12-29 — End: 2022-01-02
  Administered 2021-12-29 – 2022-01-02 (×5): 175 ug via ORAL
  Filled 2021-12-28 (×6): qty 1

## 2021-12-28 MED ORDER — PREDNISONE 5 MG PO TABS
2.5000 mg | ORAL_TABLET | Freq: Every day | ORAL | Status: DC
  Administered 2021-12-29 – 2022-01-02 (×5): 2.5 mg via ORAL
  Filled 2021-12-28 (×5): qty 1

## 2021-12-28 MED ORDER — ACETAMINOPHEN 325 MG PO TABS: 650.0000 mg | ORAL_TABLET | Freq: Four times a day (QID) | ORAL | Status: DC | PRN

## 2021-12-28 MED ORDER — INSULIN ASPART 100 UNIT/ML IJ SOLN
0.0000 [IU] | Freq: Three times a day (TID) | INTRAMUSCULAR | Status: DC
  Administered 2021-12-28: 2 [IU] via SUBCUTANEOUS
  Administered 2021-12-29: 3 [IU] via SUBCUTANEOUS
  Administered 2021-12-31: 2 [IU] via SUBCUTANEOUS

## 2021-12-28 MED ORDER — ONDANSETRON HCL 4 MG/2ML IJ SOLN
4.0000 mg | Freq: Four times a day (QID) | INTRAMUSCULAR | Status: DC | PRN
  Administered 2021-12-28 – 2021-12-31 (×4): 4 mg via INTRAVENOUS
  Filled 2021-12-28 (×5): qty 2

## 2021-12-28 MED ORDER — SODIUM CHLORIDE 0.9 % IV BOLUS
500.0000 mL | Freq: Once | INTRAVENOUS | Status: AC
  Administered 2021-12-28: 500 mL via INTRAVENOUS

## 2021-12-28 MED ORDER — PROCHLORPERAZINE EDISYLATE 10 MG/2ML IJ SOLN
5.0000 mg | Freq: Once | INTRAMUSCULAR | Status: AC
Start: 2021-12-28 — End: 2021-12-28
  Administered 2021-12-28: 5 mg via INTRAVENOUS
  Filled 2021-12-28: qty 2

## 2021-12-28 MED ORDER — POLYVINYL ALCOHOL 1.4 % OP SOLN: 1.0000 [drp] | Freq: Three times a day (TID) | OPHTHALMIC | Status: DC | PRN

## 2021-12-28 MED ORDER — UMECLIDINIUM BROMIDE 62.5 MCG/ACT IN AEPB
1.0000 | INHALATION_SPRAY | Freq: Every day | RESPIRATORY_TRACT | Status: DC
  Filled 2021-12-28: qty 7

## 2021-12-28 MED ORDER — SODIUM CHLORIDE 0.9 % IV SOLN
2.0000 g | INTRAVENOUS | Status: DC
  Administered 2021-12-29 – 2021-12-31 (×3): 2 g via INTRAVENOUS
  Filled 2021-12-28 (×4): qty 20

## 2021-12-28 NOTE — Consult Note (Signed)
Referring Provider: Rise Patience, MD Primary Care Physician:  Velna Hatchet, MD Primary Gastroenterologist:  Dr. Stacie Glaze [facility]  Reason for Consultation:  LLQ abdominal pain, abnormal CT findings  HPI: [name] is a 71 y.o. male with past medical history of aortic valve replacement, CKD 3b, CHF, pacemaker placement, T2DM, COPD on 2L qHS, HTN, HLD, hypothyroidism, paroxysmal A. Fib on Eliquis.  Patient presenting today with complaints of abdominal pain.  This has been worsening over the past 2 days.  He describes severe pain to the lower abdomen that radiates up into his right upper quadrant.  This is worse when he palpates or moves.  He denies any vomiting, diarrhea, or constipation.  He denies any urinary complaints.   Patient did have a CT scan approximately 3 weeks ago which showed thickening of a segment of his large intestine.  This could represent peristalsis, but mass could not be ruled out.  A colonoscopy was recommended, scheduled for November.  CT A/P 12/28/2021 shows patchy infiltrates in the right lower lobe, new from the previous exam, concerning for pneumonia.  Right renal atrophy with renal cortical scarring bilaterally.  Short segment of colonic wall thickening in the left lower quadrant similar in appearance to prior exam.  Colonoscopy recommended for further evaluation.  Moderate amount of retained stool in the colon suggesting constipation.   Patient seen and examined at bedside.  He is on 2 L of oxygen per nasal cannula.  States he is also on oxygen at home as needed.  He does feel short of breath at time of visit.  Reports he has had intermittent abdominal pain for a while.  Pain primarily in his left lower quadrant as well as right upper quadrant.  He takes oxycodone for back and leg pain and states that this also helps with his abdominal pain.  States pain has been intermittent for about 2 years but has worsened in the last couple days. He is taking MiraLAX  about once a week for constipation.  Reports last bowel movement was yesterday and was normal without hematochezia or melena noted.  He has some associated nausea with abdominal pain but denies any vomiting, fever, chills.  Reports pain is worse with certain movements and with palpation of his abdomen.  Last time he ate was yesterday though he is unsure exactly what time.  He is on Eliquis for A-fib.  States his wife manages his medications and he is unsure when his last dose was.  She is not in the room at time of my visit.  Patient has history of smoking but states he quit a long time ago.  Drinks very little alcohol.  Denies family history of GI malignancy or disease.  Denies MI/stroke history.  Previous GI workup: CT A/P 12/04/2021 shows short segment annular colonic wall thickening within the proximal sigmoid colon, which could reflect focal area of peristalsis.  However, underlying colonic neoplasm cannot be excluded and further evaluation with colonoscopy may be useful if not recently performed.  Stable mild splenomegaly.  Moderate retained stool throughout the colon consistent with constipation.  No bowel obstruction or ileus.  Stable fat-containing umbilical and left inguinal hernias. Cholelithiasis without acute cholecystitis.   Patient seen at outpatient GI office 12/11/2021 for repeat colon/abnormal CT. Patient was scheduled for outpatient colonoscopy at the hospital due to medical history including pacemaker and A-fib, chronic anticoagulation.  Last colonoscopy November 2012 showed 2 hyperplastic polyps, recommended repeat in 10 years.    No prior  EGD.    Echocardiogram March 2022 showed LV function of 55 to 60%.      Past Medical History:  Diagnosis Date   Aortic stenosis, mild    Arthritis of hand    "just a little bit in both hands" (03/31/2012)   Asthma    "little bit" (03/31/2012)   Atrial fibrillation ([place])    dx '04; DCCV '04, placed on flecainide, failed DCCV 04/2010,  flecainide stopped; s/p successful A.fib ablation 01/31/12   CHF (congestive heart failure) ([place])    Controlled type 2 diabetes mellitus without complication, without long-term current use of insulin ([place]) 12/13/2019   COPD (chronic obstructive pulmonary disease) (HCC)    DJD (degenerative joint disease)    Fibromyalgia    GERD (gastroesophageal reflux disease)    Hyperlipidemia    Hypertension    Hypothyroidism    S/P radiation   Left atrial enlargement    LA size 42m by echo 11/21/11   Mitral regurgitation    trivial   Obstructive sleep apnea    mild by sleep study 2013; pt stated he does not have a machine because "it wasn't bad enough for him to have one"   Pacemaker 03/31/2012   Restless leg syndrome    Second degree AV block    Splenomegaly    Visit for monitoring Tikosyn therapy 04/16/2016    Past Surgical History:  Procedure Laterality Date   AORTIC VALVE REPLACEMENT N/A 08/22/2015   Procedure: AORTIC VALVE REPLACEMENT (AVR);  Surgeon: PIvin Poot MD;  [place] [facility]  Service: Open Heart Surgery;  Laterality: N/A;   ATRIAL FIBRILLATION ABLATION  01/30/2012   PVI by Dr. ARayann Heman  ATRIAL FIBRILLATION ABLATION N/A 01/31/2012   Procedure: ATRIAL FIBRILLATION ABLATION;  Surgeon: JThompson Grayer MD;  [facility];  Service: Cardiovascular;  Laterality: N/A;   ATRIAL FIBRILLATION ABLATION N/A 04/19/2020   Procedure: ATRIAL FIBRILLATION ABLATION;  Surgeon: AThompson Grayer MD;  [facility];  Service: Cardiovascular;  Laterality: N/A;   BACK SURGERY     X 3   CARDIAC CATHETERIZATION N/A 08/09/2015   Procedure: Right/Left Heart Cath and Coronary Angiography;  Surgeon: Peter M JMartinique MD;  [place] [facility];  Service: Cardiovascular;  Laterality: N/A;   CARDIAC CATHETERIZATION N/A 02/05/2016   Procedure: Right/Left Heart Cath and Coronary Angiography;  Surgeon: JJettie Booze MD;  [place];  Service: Cardiovascular;   Laterality: N/A;   CARDIAC CATHETERIZATION N/A 04/17/2016   Procedure: Right Heart Cath;  Surgeon: DJolaine Artist MD;  [place] [facility];  Service: Cardiovascular;  Laterality: N/A;   CARDIOVASCULAR STRESS TEST  03/12/2002   EF 48%, NO EVIDENCE OF ISCHEMIA   CARDIOVERSION  01/2012; 03/31/2012   CARDIOVERSION N/A 02/25/2012   Procedure: CARDIOVERSION;  Surgeon: JThompson Grayer MD;  [facility];  Service: Cardiovascular;  Laterality: N/A;   CARDIOVERSION N/A 03/31/2012   Procedure: CARDIOVERSION;  Surgeon: JThompson Grayer MD;  [facility];  Service: Cardiovascular;  Laterality: N/A;   CARDIOVERSION N/A 11/23/2015   Procedure: CARDIOVERSION;  Surgeon: DLarey Dresser MD;  [facility];  Service: Cardiovascular;  Laterality: N/A;   CARDIOVERSION N/A 04/08/2016   Procedure: CARDIOVERSION;  Surgeon: DJolaine Artist MD;  [facility];  Service: Cardiovascular;  Laterality: N/A;   CARDIOVERSION N/A 09/14/2018   Procedure: CARDIOVERSION;  Surgeon: NJosue Hector MD;  [place]  Service: Cardiovascular;  Laterality: N/A;  CARDIOVERSION N/A 10/19/2019   Procedure: CARDIOVERSION;  Surgeon: Jolaine Artist, MD;  Location: [facility] ENDOSCOPY;  Service: Cardiovascular;  Laterality: N/A;   CARDIOVERSION N/A 02/25/2020   Procedure: CARDIOVERSION;  Surgeon: Fay Records, MD;  Location: [place];  Service: Cardiovascular;  Laterality: N/A;   CARDIOVERSION N/A 05/25/2020   Procedure: CARDIOVERSION;  Surgeon: Lelon Perla, MD;  [facility];  Service: Cardiovascular;  Laterality: N/A;   CARDIOVERSION N/A 07/10/2020   Procedure: CARDIOVERSION;  Surgeon: Jerline Pain, MD;  Location: [place];  Service: Cardiovascular;  Laterality: N/A;   CLIPPING OF ATRIAL APPENDAGE N/A 08/22/2015   Procedure: CLIPPING OF ATRIAL APPENDAGE;  Surgeon: Ivin Poot, MD;  Location: [place];  Service: Open Heart Surgery;  Laterality: N/A;    DOPPLER ECHOCARDIOGRAPHY  03/11/2002   EF 70-75%   FINGER SURGERY Left    Middle finger   FINGER TENDON REPAIR  1980's   "right little finger" (03/31/2012)   INSERT / REPLACE / REMOVE PACEMAKER     [employer]   IR RADIOLOGIST EVAL & MGMT  05/06/2019   IR RADIOLOGIST EVAL & MGMT  10/06/2019   IR RADIOLOGIST EVAL & MGMT  10/31/2020   IR RADIOLOGIST EVAL & MGMT  01/18/2021   IR RADIOLOGIST EVAL & MGMT  03/08/2021   IR RADIOLOGIST EVAL & MGMT  06/13/2021   KIDNEY SURGERY Left    LEFT AND RIGHT HEART CATHETERIZATION WITH CORONARY ANGIOGRAM N/A 10/27/2012   Procedure: LEFT AND RIGHT HEART CATHETERIZATION WITH CORONARY ANGIOGRAM;  Surgeon: Laverda Page, MD;  Location: [facility] LAB;  Service: Cardiovascular;  Laterality: N/A;   LUMBAR DISC SURGERY  1980's X2;  2000's   MAZE N/A 08/22/2015   Procedure: MAZE;  Surgeon: Ivin Poot, MD;  Location: [place];  Service: Open Heart Surgery;  Laterality: N/A;   PACEMAKER INSERTION  03/31/2012   STJ Accent DR pacemaker implanted by Dr Rayann Heman   PERMANENT PACEMAKER INSERTION N/A 03/31/2012   Procedure: PERMANENT PACEMAKER INSERTION;  Surgeon: Thompson Grayer, MD;  Location[facility] C[facility] LAB;  Service: Cardiovascular;  Laterality: N/A;   PPM GENERATOR CHANGEOUT N/A 08/08/2020   Procedure: PPM GENERATOR CHANGEOUT;  Surgeon: Thompson Grayer, MD;  Location: [place] CV LAB;  Service: Cardiovascular;  Laterality: N/A;   RADIOLOGY WITH ANESTHESIA Left 02/14/2021   Procedure: RADIOLOGY WITH ANESTHESIA LEFT RENAL CRYOABLATION;  Surgeon: Aletta Edouard, MD;  [facility];  Service: Radiology;  Laterality: Left;   TEE WITHOUT CARDIOVERSION  01/30/2012   Procedure: TRANSESOPHAGEAL ECHOCARDIOGRAM (TEE);  Surgeon: Larey Dresser, MD;  Location: [place];  Service: Cardiovascular;  Laterality: N/A;  ablation next day   TEE WITHOUT CARDIOVERSION N/A 08/22/2015   Procedure: TRANSESOPHAGEAL ECHOCARDIOGRAM (TEE);  Surgeon: Ivin Poot, MD;  Location: [place];   Service: Open Heart Surgery;  Laterality: N/A;   TEE WITHOUT CARDIOVERSION N/A 10/19/2019   Procedure: TRANSESOPHAGEAL ECHOCARDIOGRAM (TEE);  Surgeon: Jolaine Artist, MD;  [facility];  Service: Cardiovascular;  Laterality: N/A;   US ECHOCARDIOGRAPHY  08/07/2009   EF 55-60%    Prior to Admission medications   Medication Sig Start Date End Date Taking? Authorizing Provider  albuterol (PROVENTIL HFA;VENTOLIN HFA) 108 (90 Base) MCG/ACT inhaler Inhale 2 puffs into the lungs every 6 (six) hours as needed for wheezing or shortness of breath. 12/28/15  Yes Collene Gobble, MD  amLODipine (NORVASC) 5 MG tablet Take 2.5 mg by mouth daily.   Yes [provider]  apixaban Arne Cleveland)  5 MG TABS tablet Take 1 tablet (5 mg total) by mouth 2 (two) times daily. 05/11/21  Yes Allred, Jeneen Rinks, MD  bisoprolol (ZEBETA) 5 MG tablet Take 1 tablet (5 mg total) by mouth daily. Needs appt 11/19/21  Yes Bensimhon, Shaune Pascal, MD  Budeson-Glycopyrrol-Formoterol (BREZTRI AEROSPHERE) 160-9-4.8 MCG/ACT AERO Inhale 2 puffs into the lungs in the morning and at bedtime. 11/30/20  Yes Martyn Ehrich, NP  Calcium-Magnesium-Zinc (CAL-MAG-ZINC PO) Take 1 tablet by mouth daily with breakfast.   Yes [provider]  Cholecalciferol (VITAMIN D3 SUPER STRENGTH) 50 MCG (2000 UT) CAPS Take 8,000 Units by mouth at bedtime.   Yes [provider]  empagliflozin (JARDIANCE) 10 MG TABS tablet Take 1 tablet (10 mg total) by mouth daily before breakfast. Patient taking differently: Take 10 mg by mouth at bedtime. 03/14/20  Yes Bensimhon, Shaune Pascal, MD  fexofenadine (ALLEGRA) 180 MG tablet Take 1 tablet (180 mg total) by mouth daily as needed for allergies or rhinitis. 01/23/20  Yes Aline August, MD  gabapentin (NEURONTIN) 300 MG capsule Take 600-1,200 mg by mouth See admin instructions. Take 600 mg by mouth in the morning, 1,200 mg at bedtime, and an additional 600 mg once a day as needed for neuropathy 09/13/14   Yes [provider]  levothyroxine (SYNTHROID, LEVOTHROID) 175 MCG tablet Take 175 mcg by mouth daily before breakfast.   Yes [provider]  Menthol-Camphor (TIGER BALM ARTHRITIS RUB EX) Apply 1 application topically as needed (for leg pain).   Yes [provider]  montelukast (SINGULAIR) 10 MG tablet TAKE 1 TABLET(10 MG) BY MOUTH AT BEDTIME Patient taking differently: Take 10 mg by mouth at bedtime. 09/18/21  Yes Collene Gobble, MD  Multiple Vitamin (MULTIVITAMIN WITH MINERALS) TABS tablet Take 1 tablet by mouth daily. 12/15/19  Yes Sheikh, Omair Latif, DO  ondansetron (ZOFRAN-ODT) 4 MG disintegrating tablet Take 1 tablet (4 mg total) by mouth every 8 (eight) hours as needed for nausea or vomiting. 06/07/21  Yes Fredia Sorrow, MD  oxyCODONE-acetaminophen (PERCOCET/ROXICET) 5-325 MG tablet Take 1 tablet by mouth 2 (two) times daily.   Yes [provider]  oxymetazoline (AFRIN) 0.05 % nasal spray Place 1 spray into both nostrils as needed (for nose bleeds).   Yes [provider]  pantoprazole (PROTONIX) 40 MG tablet Take 40 mg by mouth daily before breakfast.   Yes [provider]  polyethylene glycol powder (GLYCOLAX/MIRALAX) 17 GM/SCOOP powder Take 17 g by mouth daily as needed for mild constipation. 01/23/20  Yes Aline August, MD  potassium chloride (KLOR-CON M) 10 MEQ tablet Take 10 mEq by mouth See admin instructions. Take 10 mEq by mouth two times a day on the days when Torsemide is taken   Yes [provider]  predniSONE (DELTASONE) 5 MG tablet Take 0.5 tablets (2.5 mg total) by mouth daily with breakfast. 10/22/21  Yes Byrum, Rose Fillers, MD  rOPINIRole (REQUIP) 1 MG tablet Take 1 tablet (1 mg total) by mouth in the morning and at bedtime. 01/23/20  Yes Aline August, MD  rosuvastatin (CRESTOR) 10 MG tablet Take 10 mg by mouth at bedtime.  08/08/16  Yes [provider]  spironolactone (ALDACTONE) 25 MG tablet Take 1 tablet  (25 mg total) by mouth daily. 02/16/16  Yes Shirley Friar, PA-C  SYSTANE ULTRA PF 0.4-0.3 % SOLN Apply 1 drop to eye 3 (three) times daily as needed (for dryness).   Yes [provider]  torsemide (DEMADEX) 20 MG tablet  Take 30 mg by mouth every other day.   Yes [provider]  traMADol (ULTRAM) 50 MG tablet Take 100 mg by mouth 2 (two) times daily. 10/26/20  Yes [provider]  traZODone (DESYREL) 50 MG tablet Take 50 mg by mouth 2 (two) times daily. 08/30/20  Yes [provider]  dicyclomine (BENTYL) 10 MG capsule Take 1 capsule (10 mg total) by mouth every 6 (six) hours as needed (For abdominal cramping). Patient not taking: Reported on 12/28/2021 06/08/21   Molpus, Jenny Reichmann, MD  LORazepam (ATIVAN) 1 MG tablet Take 1 tablet (1 mg total) by mouth every 8 (eight) hours as needed (nausea and/or restlessness). Patient not taking: Reported on 12/28/2021 08/09/20   Carmin Muskrat, MD  OXYGEN Inhale 2 L/min into the lungs See admin instructions. 2 L/min at bedtime and 2 L/min throughout the day as needed for shortness of breath    [provider]  promethazine (PHENERGAN) 25 MG tablet Take 1 tablet (25 mg total) by mouth every 6 (six) hours as needed for nausea or vomiting. Patient not taking: Reported on 12/28/2021 12/10/20   Drenda Freeze, MD    Scheduled Meds: Continuous Infusions: PRN Meds:.  Allergies as of 12/27/2021 - Review Complete 12/27/2021  Allergen Reaction Noted   Meloxicam Rash and Other (See Comments) 01/31/2012   Vancomycin Other (See Comments) 09/02/2015    Family History  Problem Relation Age of Onset   Heart failure Mother    Stroke Father    Neuropathy Neg Hx     Social History   Socioeconomic History   Marital status: Married    Spouse name: Not on file   Number of children: Not on file   Years of education: Not on file   Highest education level: Not on file  Occupational History   Occupation: retired  Tobacco  Use   Smoking status: Former    Packs/day: 1.00    Years: 25.00    Total pack years: 25.00    Types: Cigarettes    Start date: 1963    Quit date: 07/18/1991    Years since quitting: 30.4    Passive exposure: Never   Smokeless tobacco: Never  Vaping Use   Vaping Use: Never used  Substance and Sexual Activity   Alcohol use: Not Currently    Comment: occasional   Drug use: Yes    Types: Marijuana    Comment: routine use   Sexual activity: Not Currently  Other Topics Concern   Not on file  Social History Narrative   Lives in [place] with spouse.  Works as a Air traffic controller.   Social Determinants of Health   Financial Resource Strain: Not on file  Food Insecurity: Not on file  Transportation Needs: Not on file  Physical Activity: Not on file  Stress: Not on file  Social Connections: Not on file  Intimate Partner Violence: Not on file    Review of Systems: Review of Systems  Constitutional:  Negative for chills and fever.  HENT:  Negative for sore throat.   Eyes:  Negative for discharge and redness.  Respiratory:  Positive for shortness of breath. Negative for hemoptysis and stridor.   Cardiovascular:  Negative for chest pain and leg swelling.  Gastrointestinal:  Positive for abdominal pain, constipation and nausea. Negative for blood in stool, diarrhea, heartburn, melena and vomiting.  Genitourinary:  Negative for dysuria and urgency.  Musculoskeletal:  Negative for falls and joint pain.  Skin:  Negative for itching  and rash.  Neurological:  Negative for seizures and loss of consciousness.  Endo/Heme/Allergies:  Negative for environmental allergies and polydipsia.  Psychiatric/Behavioral:  Negative for memory loss. The patient does not have insomnia.      Physical Exam: Physical Exam Vitals reviewed.  Constitutional:      General: He is not in acute distress.    Appearance: He is ill-appearing.  HENT:     Head: Normocephalic and atraumatic.     Right Ear:  External ear normal.     Left Ear: External ear normal.     Nose: Nose normal.     Mouth/Throat:     Mouth: Mucous membranes are moist.     Pharynx: Oropharynx is clear.  Eyes:     General: No scleral icterus.    Extraocular Movements: Extraocular movements intact.     Conjunctiva/sclera: Conjunctivae normal.  Cardiovascular:     Rate and Rhythm: Normal rate and regular rhythm.     Pulses: Normal pulses.     Heart sounds: Normal heart sounds.  Pulmonary:     Effort: Pulmonary effort is normal. No respiratory distress.     Breath sounds: No stridor. Wheezing present.  Chest:     Chest wall: No tenderness.  Abdominal:     General: Bowel sounds are normal. There is no distension.     Palpations: Abdomen is soft.     Tenderness: There is abdominal tenderness. There is guarding. There is no rebound.  Musculoskeletal:     Cervical back: Normal range of motion and neck supple.     Right lower leg: No edema.     Left lower leg: No edema.  Skin:    General: Skin is warm and dry.  Neurological:     General: No focal deficit present.     Mental Status: He is alert and oriented to person, place, and time.  Psychiatric:        Mood and Affect: Mood normal.        Behavior: Behavior normal.      Vital signs: Vitals:   12/28/21 0641 12/28/21 0733  BP: (!) 156/80 134/72  Pulse: 60 60  Resp: 16 19  Temp: (!) 97.5 F (36.4 C) 98.6 F (37 C)  SpO2: 95% 96%        GI:  Lab Results: Recent Labs    12/27/21 2304  WBC 21.9*  HGB 14.7  HCT 46.4  PLT 166   BMET Recent Labs    12/27/21 2311  NA 137  K 4.4  CL 104  CO2 25  GLUCOSE 141*  BUN 27*  CREATININE 1.79*  CALCIUM 9.5   LFT Recent Labs    12/27/21 2311  PROT 7.4  ALBUMIN 4.1  AST 20  ALT 17  ALKPHOS 84  BILITOT 1.7*   PT/INR No results for input(s): "LABPROT", "INR" in the last 72 hours.   Studies/Results: CT ABDOMEN PELVIS W CONTRAST  Result Date: 12/28/2021 CLINICAL DATA:  Right-sided  abdominal pain with nausea. EXAM: CT ABDOMEN AND PELVIS WITH CONTRAST TECHNIQUE: Multidetector CT imaging of the abdomen and pelvis was performed using the standard protocol following bolus administration of intravenous contrast. RADIATION DOSE REDUCTION: This exam was performed according to the departmental dose-optimization program which includes automated exposure control, adjustment of the mA and/or kV according to patient size and/or use of iterative reconstruction technique. CONTRAST:  66m OMNIPAQUE IOHEXOL 300 MG/ML  SOLN COMPARISON:  12/04/2021. FINDINGS: Lower chest: The heart is normal in size and  pacemaker leads are noted in the right heart. No pericardial effusion. Patchy infiltrates are present in the right lower lobe. A few ground-glass opacities are noted in the medial aspect of the left lower lobe. Hepatobiliary: No focal liver abnormality. A stone is noted in the gallbladder. No biliary ductal dilatation. Pancreas: Unremarkable. No pancreatic ductal dilatation or surrounding inflammatory changes. Spleen: The spleen is enlarged measuring 16.7 cm in length. Adrenals/Urinary Tract: No adrenal nodule or mass. There is atrophy and cortical scarring involving the right kidney. There is cortical scarring in the mid left kidney with associated fat stranding, unchanged from the prior exam. Subcentimeter hypodensities are noted in the kidneys bilaterally which are too small to further characterize. No renal calculus or hydronephrosis. The bladder is unremarkable. Stomach/Bowel: Stomach is within normal limits. Appendix appears normal. No bowel obstruction or surrounding inflammatory changes. There is redemonstration of a short-segment of sigmoid colon in the left lower quadrant with mild wall thickening, not significantly changed from the prior exam. No free air or pneumatosis. A moderate amount of retained stool is present in the colon compatible with constipation. Vascular/Lymphatic: Aortic  atherosclerosis. No enlarged abdominal or pelvic lymph nodes. Reproductive: Prostate is unremarkable. Other: No abdominopelvic ascites. Fat containing inguinal hernias are present bilaterally. There is a fat containing umbilical hernia. Musculoskeletal: Degenerative changes are present in the thoracolumbar spine. No acute or suspicious osseous abnormality. IMPRESSION: 1. Patchy infiltrates in the right lower lobe, new from the previous exam, concerning for pneumonia. 2. Right renal atrophy with renal cortical scarring bilaterally. 3. Short-segment of colonic wall thickening in the left lower quadrant similar in appearance to the prior exam. Colonoscopy is recommended for further evaluation on follow-up. 4. Moderate amount of retained stool in the colon suggesting constipation. 5. Splenomegaly. 6. Aortic atherosclerosis. Electronically Signed   By: Brett Fairy M.D.   On: 12/28/2021 01:30   DG Chest 2 View  Result Date: 12/27/2021 CLINICAL DATA:  Chest pain and nausea. EXAM: CHEST - 2 VIEW COMPARISON:  June 08, 2021 FINDINGS: Multiple sternal wires are noted. A dual lead AICD is seen. The cardiac silhouette is borderline in size. An artificial cardiac valve is seen. An atrial appendage clip is also noted. There is moderate severity calcification of the aortic arch. There is no evidence of an acute infiltrate, pleural effusion or pneumothorax. The visualized skeletal structures are unremarkable. IMPRESSION: No active cardiopulmonary disease. Electronically Signed   By: Virgina Norfolk M.D.   On: 12/27/2021 23:27    Impression: LLQ Abdominal pain, abnormal CT findings  - CT A/P 12/28/2021 shows patchy infiltrates in the right lower lobe, new from the previous exam, concerning for pneumonia.  Right renal atrophy with renal cortical scarring bilaterally.  Short segment of colonic wall thickening in the left lower quadrant similar in appearance to prior exam.  Colonoscopy recommended for further evaluation.   Moderate amount of retained stool in the colon suggesting constipation.  - Labs show normal hemoglobin, 14.7.  Leukocytosis with WBC 21.9, platelets 166 - BUN 27, creatinine 1.79, GFR 40, T. bili 1.7 - Lipase ordered, pending  Right lower lobe pneumonia, acute on chronic hypoxic respiratory failure - Started on Rocephin/azithromycin - On 2 L supplemental oxygen per nasal cannula  Plan: Patient with right lower lobe pneumonia, chest pain, shortness of breath. Diffusely tender to palpation of abdomen though worse in LLQ and RUQ. Abnormal CT finding of colonic wall thickening in the left lower quadrant.  This was seen on prior exam 12/04/2021 and patient  is scheduled for colonoscopy in November with Dr. Paulita Fujita.  Will discuss with Dr. Therisa Doyne who will see patient later today.  Could consider inpatient colonoscopy.  Patient was unable to tell me when his last dose of Eliquis was and we will need to confirm this prior to scheduling. Continue current antibiotics for treatment of pneumonia. Continue pain management and other supportive care. [facility] will follow.    LOS: 0 days   Angelique Holm  PA-C 12/28/2021, 8:28 AM  Contact #  [phone]

## 2021-12-28 NOTE — Progress Notes (Signed)
Pharmacy Brief Note - Evening Anticoagulation Follow Up:  Pt is a 42 yoM on heparin drip for atrial fibrillation while apixaban is on hold. Currently monitoring using aPTT since baseline HL falsely elevated. For full history, see note by Dorena Bodo, PharmD from earlier today.   Assessment: aPTT = 61 seconds is subtherapeutic on heparin infusion of 1050 units/hr Confirmed with RN that heparin infusing at correct rate. No issues with line. No signs of bleeding.   Goal: HL 0.3 - 0.7  Plan: Heparin bolus of 1000 units IV once Increase rate of heparin infusion to 1200 units/hr Check aPTT 8 hours after rate change Daily CBC, aPTT/HL. Monitor using HL only once they correlate Monitor for signs of bleeding  Lenis Noon, PharmD 12/28/21 3:19 PM

## 2021-12-28 NOTE — ED Notes (Signed)
Pt found at the edge of the stretcher. Oxygen detached. Vomiting in trash can. Labored breathing. SpO2 86% on room air. Pt assisted back into stretcher. EDP Delo placed orders for pt as he is awaiting admitting MD orders. IV Zofran and Dilaudid given for ongoing chest/abdomen pain. Call bell within reach. Pt requested to call RN if needing further assistance d/t concern for injury/falls. Pt verbalized understanding.

## 2021-12-28 NOTE — ED Provider Notes (Signed)
[facility] DEPT Provider Note   CSN: [id number] Arrival date & time: 12/27/21  2248     History  Chief Complaint  Patient presents with   Chest Pain    [name] is a 71 y.o. male.  Patient is a 71 year old male with past medical history of aortic valve replacement on anticoagulation, congestive heart failure, pacemaker placement, type 2 diabetes, COPD, paroxysmal atrial fibrillation.  Patient presenting today with complaints of abdominal pain.  This has been worsening over the past 2 days.  He describes severe pain to the lower abdomen that radiates up into his right upper quadrant.  This is worse when he palpates or moves.  He denies any vomiting, diarrhea, or constipation.  He denies any urinary complaints.  Patient did have a CT scan approximately 3 weeks ago which showed thickening of a segment of his large intestine.  This could represent peristalsis, but mass could not be ruled out.  A colonoscopy was recommended, however patient not felt to be a good candidate for this due to his underlying medical conditions.  The history is provided by the patient.       Home Medications Prior to Admission medications   Medication Sig Start Date End Date Taking? Authorizing Provider  albuterol (PROVENTIL HFA;VENTOLIN HFA) 108 (90 Base) MCG/ACT inhaler Inhale 2 puffs into the lungs every 6 (six) hours as needed for wheezing or shortness of breath. 12/28/15   Collene Gobble, MD  amLODipine (NORVASC) 5 MG tablet Take 2.5 mg by mouth daily.    [provider]  apixaban (ELIQUIS) 5 MG TABS tablet Take 1 tablet (5 mg total) by mouth 2 (two) times daily. 05/11/21   Allred, Jeneen Rinks, MD  bisoprolol (ZEBETA) 5 MG tablet Take 1 tablet (5 mg total) by mouth daily. Needs appt 11/19/21   Bensimhon, Shaune Pascal, MD  Budeson-Glycopyrrol-Formoterol (BREZTRI AEROSPHERE) 160-9-4.8 MCG/ACT AERO Inhale 2 puffs into the lungs in the morning and at bedtime. 11/30/20   Martyn Ehrich, NP  Calcium-Magnesium-Zinc (CAL-MAG-ZINC PO) Take 1 tablet by mouth daily with breakfast.    [provider]  Cholecalciferol (VITAMIN D3 SUPER STRENGTH) 50 MCG (2000 UT) CAPS Take 8,000 Units by mouth at bedtime.    [provider]  dicyclomine (BENTYL) 10 MG capsule Take 1 capsule (10 mg total) by mouth every 6 (six) hours as needed (For abdominal cramping). 06/08/21   Molpus, Jenny Reichmann, MD  empagliflozin (JARDIANCE) 10 MG TABS tablet Take 1 tablet (10 mg total) by mouth daily before breakfast. 03/14/20   Bensimhon, Shaune Pascal, MD  fexofenadine (ALLEGRA) 180 MG tablet Take 1 tablet (180 mg total) by mouth daily as needed for allergies or rhinitis. 01/23/20   Aline August, MD  gabapentin (NEURONTIN) 300 MG capsule Take 600-1,200 mg by mouth See admin instructions. Take 600 mg by mouth in the morning, 1,200 mg at bedtime, and an additional 600 mg once a day as needed for neuropathy 09/13/14   [provider]  levothyroxine (SYNTHROID, LEVOTHROID) 175 MCG tablet Take 175 mcg by mouth daily before breakfast.    [provider]  LORazepam (ATIVAN) 1 MG tablet Take 1 tablet (1 mg total) by mouth every 8 (eight) hours as needed (nausea and/or restlessness). 08/09/20   Carmin Muskrat, MD  Menthol-Camphor (TIGER BALM ARTHRITIS RUB EX) Apply 1 application topically as needed (for leg pain).    [provider]  montelukast (SINGULAIR) 10 MG tablet TAKE 1 TABLET(10 MG) BY MOUTH AT BEDTIME  09/18/21   Collene Gobble, MD  Multiple Vitamin (MULTIVITAMIN WITH MINERALS) TABS tablet Take 1 tablet by mouth daily. 12/15/19   Sheikh, Georgina Quint Latif, DO  ondansetron (ZOFRAN-ODT) 4 MG disintegrating tablet Take 1 tablet (4 mg total) by mouth every 8 (eight) hours as needed for nausea or vomiting. 06/07/21   Fredia Sorrow, MD  oxyCODONE-acetaminophen (PERCOCET/ROXICET) 5-325 MG tablet Take 1 tablet by mouth 2 (two) times daily.    [provider]  OXYGEN Inhale 2 L/min  into the lungs See admin instructions. 2 L/min at bedtime and 2 L/min throughout the day as needed for shortness of breath    [provider]  oxymetazoline (AFRIN) 0.05 % nasal spray Place 1 spray into both nostrils as needed (for nose bleeds).    [provider]  pantoprazole (PROTONIX) 40 MG tablet Take 40 mg by mouth daily before breakfast.    [provider]  polyethylene glycol powder (GLYCOLAX/MIRALAX) 17 GM/SCOOP powder Take 17 g by mouth daily as needed for mild constipation. 01/23/20   Aline August, MD  potassium chloride SA (KLOR-CON) 20 MEQ tablet Take 20 mEq by mouth See admin instructions. Take 20 mEq by mouth two times a day on the days when Torsemide is taken    [provider]  predniSONE (DELTASONE) 5 MG tablet Take 0.5 tablets (2.5 mg total) by mouth daily with breakfast. 10/22/21   Collene Gobble, MD  promethazine (PHENERGAN) 25 MG tablet Take 1 tablet (25 mg total) by mouth every 6 (six) hours as needed for nausea or vomiting. 12/10/20   Drenda Freeze, MD  rOPINIRole (REQUIP) 1 MG tablet Take 1 tablet (1 mg total) by mouth in the morning and at bedtime. 01/23/20   Aline August, MD  rosuvastatin (CRESTOR) 10 MG tablet Take 10 mg by mouth at bedtime.  08/08/16   [provider]  spironolactone (ALDACTONE) 25 MG tablet Take 1 tablet (25 mg total) by mouth daily. 02/16/16   Shirley Friar, PA-C  SYSTANE ULTRA PF 0.4-0.3 % SOLN Apply 1 drop to eye 3 (three) times daily as needed (for dryness).    [provider]  torsemide (DEMADEX) 20 MG tablet Take 30 mg by mouth every other day.    [provider]  traMADol (ULTRAM) 50 MG tablet Take 100 mg by mouth 2 (two) times daily. 10/26/20   [provider]  traZODone (DESYREL) 50 MG tablet Take 50 mg by mouth 2 (two) times daily. 08/30/20   [provider]      Allergies    Meloxicam and Vancomycin    Review of Systems   Review of Systems  All  other systems reviewed and are negative.   Physical Exam Updated Vital Signs BP 128/69   Pulse (!) 58   Temp 98 F (36.7 C) (Oral)   Resp (!) 22   SpO2 97%  Physical Exam Vitals and nursing note reviewed.  Constitutional:      General: He is not in acute distress.    Appearance: He is well-developed. He is not diaphoretic.  HENT:     Head: Normocephalic and atraumatic.  Cardiovascular:     Rate and Rhythm: Normal rate and regular rhythm.     Heart sounds: No murmur heard.    No friction rub.  Pulmonary:     Effort: Pulmonary effort is normal. No respiratory distress.     Breath sounds: Normal breath sounds. No wheezing or rales.  Abdominal:  General: Bowel sounds are normal. There is no distension.     Palpations: Abdomen is soft.     Tenderness: There is abdominal tenderness.     Comments: There is generalized abdominal tenderness, however most significant in the suprapubic region and right upper quadrant.  He does have an umbilical hernia which is easily reducible.  Musculoskeletal:        General: Normal range of motion.     Cervical back: Normal range of motion and neck supple.  Skin:    General: Skin is warm and dry.  Neurological:     Mental Status: He is alert and oriented to person, place, and time.     Coordination: Coordination normal.     ED Results / Procedures / Treatments   Labs (all labs ordered are listed, but only abnormal results are displayed) Labs Reviewed  CBC - Abnormal; Notable for the following components:      Result Value   WBC 21.9 (*)    RDW 17.7 (*)    All other components within normal limits  RESP PANEL BY RT-PCR (FLU A&B, COVID) ARPGX2  COMPREHENSIVE METABOLIC PANEL  LIPASE, BLOOD  URINALYSIS, ROUTINE W REFLEX MICROSCOPIC  TROPONIN I (HIGH SENSITIVITY)    EKG EKG Interpretation  Date/Time:  Thursday December 27 2021 22:58:19 EDT Ventricular Rate:  65 PR Interval:    QRS Duration: 182 QT Interval:  472 QTC  Calculation: 491 R Axis:   262 Text Interpretation: Ventricular paced rhythm Confirmed by Veryl Speak [phone]) on 12/28/2021 12:01:39 AM  Radiology DG Chest 2 View  Result Date: 12/27/2021 CLINICAL DATA:  Chest pain and nausea. EXAM: CHEST - 2 VIEW COMPARISON:  June 08, 2021 FINDINGS: Multiple sternal wires are noted. A dual lead AICD is seen. The cardiac silhouette is borderline in size. An artificial cardiac valve is seen. An atrial appendage clip is also noted. There is moderate severity calcification of the aortic arch. There is no evidence of an acute infiltrate, pleural effusion or pneumothorax. The visualized skeletal structures are unremarkable. IMPRESSION: No active cardiopulmonary disease. Electronically Signed   By: Virgina Norfolk M.D.   On: 12/27/2021 23:27    Procedures Procedures    Medications Ordered in ED Medications  sodium chloride 0.9 % bolus 500 mL (has no administration in time range)  ondansetron (ZOFRAN) injection 4 mg (has no administration in time range)  HYDROmorphone (DILAUDID) injection 1 mg (has no administration in time range)  ipratropium-albuterol (DUONEB) 0.5-2.5 (3) MG/3ML nebulizer solution 3 mL (3 mLs Nebulization Given 12/28/21 0001)    ED Course/ Medical Decision Making/ A&P  This patient presents to the ED for concern of right-sided abdominal pain, this involves an extensive number of treatment options, and is a complaint that carries with it a high risk of complications and morbidity.  The differential diagnosis includes acute appendicitis, acute diverticulitis, acute cholecystitis, pancreatitis   Co morbidities that complicate the patient evaluation  None   Additional history obtained:  No additional history or external records needed   Lab Tests:  I Ordered, and personally interpreted labs.  The pertinent results include: White count of 22,000 and a creatinine of 1.8, but otherwise laboratory studies are unremarkable.  COVID test is  negative   Imaging Studies ordered:  I ordered imaging studies including chest x-ray I independently visualized and interpreted imaging which showed no acute process I agree with the radiologist interpretation Also obtained was a CT scan showing patchy infiltrates in the right lower lobe concerning for  pneumonia.  The abdominal portion of the CT is unchanged from prior otherwise   Cardiac Monitoring: / EKG:  The patient was maintained on a cardiac monitor.  I personally viewed and interpreted the cardiac monitored which showed an underlying rhythm of: Sinus   Consultations Obtained:  I requested consultation with the hospitalist,  and discussed lab and imaging findings as well as pertinent plan - they recommend: Admission for IV antibiotics, pain medication, and close monitoring   Problem List / ED Course / Critical interventions / Medication management  Patient with extensive past medical history as described in the HPI presenting with severe pain to the right flank and right abdomen.  Laboratory studies show a white count of 22,000.  A CT scan was obtained of the abdomen and pelvis showing no acute intra-abdominal process, but is consistent with a pneumonia in the right lower lobe.  I strongly suspect that this is the cause of his discomfort.  I have initiated IV Rocephin and Zithromax as well as Dilaudid for pain.  I have consulted the hospitalist who will admit. Reevaluation of the patient after these medicines showed that the patient improved I have reviewed the patients home medicines and have made adjustments as needed   Social Determinants of Health:  None   Test / Admission - Considered:  Patient to be admitted to the hospitalist service for IV antibiotics and close monitoring.  He has extensive past medical history and a marked leukocytosis, both of which I feel are red flags and prevent safe discharge.  Final Clinical Impression(s) / ED Diagnoses Final diagnoses:   None    Rx / DC Orders ED Discharge Orders     None         Veryl Speak, MD 12/28/21 [phone]

## 2021-12-28 NOTE — H&P (Addendum)
History and Physical    Patient: [name] MRN:[id number] DOB: 12/25/1950 DOA: 12/27/2021 DOS: the patient was seen and examined on 12/28/2021 PCP: Holwerda, Scott, MD  Patient coming from: Home  Chief Complaint:  Chief Complaint  Patient presents with   Chest Pain   HPI: [name] is a 71 y.o. male with medical history significant of a fib on eliquis, CKD 3b, chronic diastolic HF, DM, COPD on 2L qHS, HTN, HLD, hypothyroidism, CHB s/p PPM. Presenting with right chest pain and LLQ abdominal pain. His symptoms have been intermittent for a couple of months, he reports. However, over the last 2 days he's had an increase in both. He feels sharp right axillary/chest pain. He's had increased cough, but no fevers. He's not had any sick contacts. He tried his oxycodone but it didn't help. He says his LLQ worsened 2 days ago. He's had some nausea but no vomiting or diarrhea. No diet changes recently. He says the oxycodone did not help that pain either. When his symptoms did not improve last night, he decided to come to the ED for assistance. He denies any other aggravating or alleviating factors.   Review of Systems: As mentioned in the history of present illness. All other systems reviewed and are negative. Past Medical History:  Diagnosis Date   Aortic stenosis, mild    Arthritis of hand    "just a little bit in both hands" (03/31/2012)   Asthma    "little bit" (03/31/2012)   Atrial fibrillation (HCC)    dx '04; DCCV '04, placed on flecainide, failed DCCV 04/2010, flecainide stopped; s/p successful A.fib ablation 01/31/12   CHF (congestive heart failure) (HCC)    Controlled type 2 diabetes mellitus without complication, without long-term current use of insulin (HCC) 12/13/2019   COPD (chronic obstructive pulmonary disease) (HCC)    DJD (degenerative joint disease)    Fibromyalgia    GERD (gastroesophageal reflux disease)    Hyperlipidemia    Hypertension    Hypothyroidism    S/P  radiation   Left atrial enlargement    LA size 45mm by echo 11/21/11   Mitral regurgitation    trivial   Obstructive sleep apnea    mild by sleep study 2013; pt stated he does not have a machine because "it wasn't bad enough for him to have one"   Pacemaker 03/31/2012   Restless leg syndrome    Second degree AV block    Splenomegaly    Visit for monitoring Tikosyn therapy 04/16/2016   Past Surgical History:  Procedure Laterality Date   AORTIC VALVE REPLACEMENT N/A 08/22/2015   Procedure: AORTIC VALVE REPLACEMENT (AVR);  Surgeon: Peter Van Trigt, MD;  Location: [facility];  Service: Open Heart Surgery;  Laterality: N/A;   ATRIAL FIBRILLATION ABLATION  01/30/2012   PVI by Dr. Allred   ATRIAL FIBRILLATION ABLATION N/A 01/31/2012   Procedure: ATRIAL FIBRILLATION ABLATION;  Surgeon: James Allred, MD;  Location: [facility] CATH LAB;  Service: Cardiovascular;  Laterality: N/A;   ATRIAL FIBRILLATION ABLATION N/A 04/19/2020   Procedure: ATRIAL FIBRILLATION ABLATION;  Surgeon: Allred, James, MD;  [facility]: [facility] IN[facility]AS[facility];  Service: Cardiovascular;  Laterality: N/A;   BACK SURGERY     X 3   CARDIAC CATHETERIZATION N/A 08/09/2015   Procedure: Right/Left Heart Cath and Coronary Angiography;  Surgeon: Peter M Jordan, MD;  [facility]: [facility];  Service: Cardiovascular;  Laterality: N/A;   CARDIAC CATHETERIZATION N/A 02/05/2016   Procedure: Right/Left Heart Cath   and Coronary Angiography;  Surgeon: Jettie Booze, MD;  Location: [place] CV LAB;  Service: Cardiovascular;  Laterality: N/A;   CARDIAC CATHETERIZATION N/A 04/17/2016   Procedure: Right Heart Cath;  Surgeon: Jolaine Artist, MD;  Location: [place] CV LAB;  Service: Cardiovascular;  Laterality: N/A;   CARDIOVASCULAR STRESS TEST  03/12/2002   EF 48%, NO EVIDENCE OF ISCHEMIA   CARDIOVERSION  01/2012; 03/31/2012   CARDIOVERSION N/A 02/25/2012   Procedure: CARDIOVERSION;  Surgeon: Thompson Grayer, MD;  Location: [facility] CATH LAB;   Service: Cardiovascular;  Laterality: N/A;   CARDIOVERSION N/A 03/31/2012   Procedure: CARDIOVERSION;  Surgeon: Thompson Grayer, MD;  Location: [facility] CATH LAB;  Service: Cardiovascular;  Laterality: N/A;   CARDIOVERSION N/A 11/23/2015   Procedure: CARDIOVERSION;  Surgeon: Larey Dresser, MD;  Location: [place];  Service: Cardiovascular;  Laterality: N/A;   CARDIOVERSION N/A 04/08/2016   Procedure: CARDIOVERSION;  Surgeon: Jolaine Artist, MD;  Location: [place];  Service: Cardiovascular;  Laterality: N/A;   CARDIOVERSION N/A 09/14/2018   Procedure: CARDIOVERSION;  Surgeon: Josue Hector, MD;  Location: [place];  Service: Cardiovascular;  Laterality: N/A;   CARDIOVERSION N/A 10/19/2019   Procedure: CARDIOVERSION;  Surgeon: Jolaine Artist, MD;  Location: [facility] ENDOSCOPY;  Service: Cardiovascular;  Laterality: N/A;   CARDIOVERSION N/A 02/25/2020   Procedure: CARDIOVERSION;  Surgeon: Fay Records, MD;  Location: [place];  Service: Cardiovascular;  Laterality: N/A;   CARDIOVERSION N/A 05/25/2020   Procedure: CARDIOVERSION;  Surgeon: Lelon Perla, MD;  Location: [facility] ENDOSCOPY;  Service: Cardiovascular;  Laterality: N/A;   CARDIOVERSION N/A 07/10/2020   Procedure: CARDIOVERSION;  Surgeon: Jerline Pain, MD;  Location: [place];  Service: Cardiovascular;  Laterality: N/A;   CLIPPING OF ATRIAL APPENDAGE N/A 08/22/2015   Procedure: CLIPPING OF ATRIAL APPENDAGE;  Surgeon: Ivin Poot, MD;  Location: [place];  Service: Open Heart Surgery;  Laterality: N/A;   DOPPLER ECHOCARDIOGRAPHY  03/11/2002   EF 70-75%   FINGER SURGERY Left    Middle finger   FINGER TENDON REPAIR  1980's   "right little finger" (03/31/2012)   INSERT / REPLACE / REMOVE PACEMAKER     [employer]   IR RADIOLOGIST EVAL & MGMT  05/06/2019   IR RADIOLOGIST EVAL & MGMT  10/06/2019   IR RADIOLOGIST EVAL & MGMT  10/31/2020   IR RADIOLOGIST EVAL & MGMT  01/18/2021   IR RADIOLOGIST EVAL & MGMT  03/08/2021    IR RADIOLOGIST EVAL & MGMT  06/13/2021   KIDNEY SURGERY Left    LEFT AND RIGHT HEART CATHETERIZATION WITH CORONARY ANGIOGRAM N/A 10/27/2012   Procedure: LEFT AND RIGHT HEART CATHETERIZATION WITH CORONARY ANGIOGRAM;  Surgeon: Laverda Page, MD;  Location: [facility] CATH LAB;  Service: Cardiovascular;  Laterality: N/A;   LUMBAR DISC SURGERY  1980's X2;  2000's   MAZE N/A 08/22/2015   Procedure: MAZE;  Surgeon: Ivin Poot, MD;  Location: [place];  Service: Open Heart Surgery;  Laterality: N/A;   PACEMAKER INSERTION  03/31/2012   STJ Accent DR pacemaker implanted by Dr Rayann Heman   PERMANENT PACEMAKER INSERTION N/A 03/31/2012   Procedure: PERMANENT PACEMAKER INSERTION;  Surgeon: Thompson Grayer, MD;  Location: [facility] CATH LAB;  Service: Cardiovascular;  Laterality: N/A;   PPM GENERATOR CHANGEOUT N/A 08/08/2020   Procedure: PPM GENERATOR CHANGEOUT;  Surgeon: Thompson Grayer, MD;  Location: [place] CV LAB;  Service: Cardiovascular;  Laterality: N/A;   RADIOLOGY WITH ANESTHESIA Left 02/14/2021  Procedure: RADIOLOGY WITH ANESTHESIA LEFT RENAL CRYOABLATION;  Surgeon: Yamagata, Glenn, MD;  Location: [facility] ORS;  Service: Radiology;  Laterality: Left;   TEE WITHOUT CARDIOVERSION  01/30/2012   Procedure: TRANSESOPHAGEAL ECHOCARDIOGRAM (TEE);  Surgeon: Dalton S McLean, MD;  Location: [facility] ENDOSCOPY;  Service: Cardiovascular;  Laterality: N/A;  ablation next day   TEE WITHOUT CARDIOVERSION N/A 08/22/2015   Procedure: TRANSESOPHAGEAL ECHOCARDIOGRAM (TEE);  Surgeon: Peter Van Trigt, MD;  Location: [facility] OR;  Service: Open Heart Surgery;  Laterality: N/A;   TEE WITHOUT CARDIOVERSION N/A 10/19/2019   Procedure: TRANSESOPHAGEAL ECHOCARDIOGRAM (TEE);  Surgeon: Bensimhon, Daniel R, MD;  Location: [facility] ENDOSCOPY;  Service: Cardiovascular;  Laterality: N/A;   US ECHOCARDIOGRAPHY  08/07/2009   EF 55-60%   Social History:  reports that he quit smoking about 30 years ago. His smoking use included cigarettes. He started smoking about 60  years ago. He has a 25.00 pack-year smoking history. He has never been exposed to tobacco smoke. He has never used smokeless tobacco. He reports that he does not currently use alcohol. He reports current drug use. Drug: Marijuana.  Allergies  Allergen Reactions   Meloxicam Rash and Other (See Comments)    Red Man Syndrome, also   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 syndrome 09/02/15, resolved with diphenhydramine and slowing of rate    Family History  Problem Relation Age of Onset   Heart failure Mother    Stroke Father    Neuropathy Neg Hx     Prior to Admission medications   Medication Sig Start Date End Date Taking? Authorizing Provider  albuterol (PROVENTIL HFA;VENTOLIN HFA) 108 (90 Base) MCG/ACT inhaler Inhale 2 puffs into the lungs every 6 (six) hours as needed for wheezing or shortness of breath. 12/28/15  Yes Byrum, Robert S, MD  amLODipine (NORVASC) 5 MG tablet Take 2.5 mg by mouth daily.   Yes [provider]  apixaban (ELIQUIS) 5 MG TABS tablet Take 1 tablet (5 mg total) by mouth 2 (two) times daily. 05/11/21  Yes Allred, James, MD  bisoprolol (ZEBETA) 5 MG tablet Take 1 tablet (5 mg total) by mouth daily. Needs appt 11/19/21  Yes Bensimhon, Daniel R, MD  Budeson-Glycopyrrol-Formoterol (BREZTRI AEROSPHERE) 160-9-4.8 MCG/ACT AERO Inhale 2 puffs into the lungs in the morning and at bedtime. 11/30/20  Yes Walsh, Elizabeth W, NP  Calcium-Magnesium-Zinc (CAL-MAG-ZINC PO) Take 1 tablet by mouth daily with breakfast.   Yes [provider]  Cholecalciferol (VITAMIN D3 SUPER STRENGTH) 50 MCG (2000 UT) CAPS Take 8,000 Units by mouth at bedtime.   Yes [provider]  empagliflozin (JARDIANCE) 10 MG TABS tablet Take 1 tablet (10 mg total) by mouth daily before breakfast. Patient taking differently: Take 10 mg by mouth at bedtime. 03/14/20  Yes Bensimhon, Daniel R, MD  fexofenadine (ALLEGRA) 180 MG tablet Take 1 tablet (180 mg total) by mouth daily as needed  for allergies or rhinitis. 01/23/20  Yes Alekh, Kshitiz, MD  gabapentin (NEURONTIN) 300 MG capsule Take 600-1,200 mg by mouth See admin instructions. Take 600 mg by mouth in the morning, 1,200 mg at bedtime, and an additional 600 mg once a day as needed for neuropathy 09/13/14  Yes [provider]  levothyroxine (SYNTHROID, LEVOTHROID) 175 MCG tablet Take 175 mcg by mouth daily before breakfast.   Yes [provider]  Menthol-Camphor (TIGER BALM ARTHRITIS RUB EX) Apply 1 application topically as needed (for leg pain).   Yes [provider]  montelukast (SINGULAIR) 10 MG   tablet TAKE 1 TABLET(10 MG) BY MOUTH AT BEDTIME Patient taking differently: Take 10 mg by mouth at bedtime. 09/18/21  Yes Byrum, Robert S, MD  Multiple Vitamin (MULTIVITAMIN WITH MINERALS) TABS tablet Take 1 tablet by mouth daily. 12/15/19  Yes Sheikh, Omair Latif, DO  ondansetron (ZOFRAN-ODT) 4 MG disintegrating tablet Take 1 tablet (4 mg total) by mouth every 8 (eight) hours as needed for nausea or vomiting. 06/07/21  Yes Zackowski, Scott, MD  oxyCODONE-acetaminophen (PERCOCET/ROXICET) 5-325 MG tablet Take 1 tablet by mouth 2 (two) times daily.   Yes [provider]  oxymetazoline (AFRIN) 0.05 % nasal spray Place 1 spray into both nostrils as needed (for nose bleeds).   Yes [provider]  pantoprazole (PROTONIX) 40 MG tablet Take 40 mg by mouth daily before breakfast.   Yes [provider]  polyethylene glycol powder (GLYCOLAX/MIRALAX) 17 GM/SCOOP powder Take 17 g by mouth daily as needed for mild constipation. 01/23/20  Yes Alekh, Kshitiz, MD  potassium chloride (KLOR-CON M) 10 MEQ tablet Take 10 mEq by mouth See admin instructions. Take 10 mEq by mouth two times a day on the days when Torsemide is taken   Yes [provider]  predniSONE (DELTASONE) 5 MG tablet Take 0.5 tablets (2.5 mg total) by mouth daily with breakfast. 10/22/21  Yes Byrum, Robert S, MD  rOPINIRole (REQUIP)  1 MG tablet Take 1 tablet (1 mg total) by mouth in the morning and at bedtime. 01/23/20  Yes Alekh, Kshitiz, MD  rosuvastatin (CRESTOR) 10 MG tablet Take 10 mg by mouth at bedtime.  08/08/16  Yes [provider]  spironolactone (ALDACTONE) 25 MG tablet Take 1 tablet (25 mg total) by mouth daily. 02/16/16  Yes Tillery, Michael Andrew, PA-C  SYSTANE ULTRA PF 0.4-0.3 % SOLN Apply 1 drop to eye 3 (three) times daily as needed (for dryness).   Yes [provider]  torsemide (DEMADEX) 20 MG tablet Take 30 mg by mouth every other day.   Yes [provider]  traMADol (ULTRAM) 50 MG tablet Take 100 mg by mouth 2 (two) times daily. 10/26/20  Yes [provider]  traZODone (DESYREL) 50 MG tablet Take 50 mg by mouth 2 (two) times daily. 08/30/20  Yes [provider]  dicyclomine (BENTYL) 10 MG capsule Take 1 capsule (10 mg total) by mouth every 6 (six) hours as needed (For abdominal cramping). Patient not taking: Reported on 12/28/2021 06/08/21   Molpus, John, MD  LORazepam (ATIVAN) 1 MG tablet Take 1 tablet (1 mg total) by mouth every 8 (eight) hours as needed (nausea and/or restlessness). Patient not taking: Reported on 12/28/2021 08/09/20   Lockwood, Robert, MD  OXYGEN Inhale 2 L/min into the lungs See admin instructions. 2 L/min at bedtime and 2 L/min throughout the day as needed for shortness of breath    [provider]  promethazine (PHENERGAN) 25 MG tablet Take 1 tablet (25 mg total) by mouth every 6 (six) hours as needed for nausea or vomiting. Patient not taking: Reported on 12/28/2021 12/10/20   Yao, David Hsienta, MD    Physical Exam: Vitals:   12/28/21 0500 12/28/21 0600 12/28/21 0641 12/28/21 0733  BP: 119/76  (!) 156/80 134/72  Pulse: (!) 59 80 60 60  Resp: 19 (!) 22 16 19  Temp:   (!) 97.5 F (36.4 C) 98.6 F (37 C)  TempSrc:   Oral Oral  SpO2: 96% 96% 95% 96%   General: 71 y.o. chronically ill appearing male resting in   bed in NAD Eyes: PERRL,  normal sclera ENMT: Nares patent w/o discharge, orophaynx clear, dentition normal, ears w/o discharge/lesions/ulcers Neck: Supple, trachea midline Cardiovascular: RRR, +S1, S2, no m/g/r, equal pulses throughout Respiratory: right mid/lower lobe rhonchi, soft exp wheeze, no r, slight increased WOB on 4L GI: BS+, ND, mild TTP LLQ, no masses noted, no organomegaly noted MSK: No e/c/c Neuro: A&O x 3, no focal deficits Psyc: Appropriate interaction and affect, calm/cooperative  Data Reviewed:  Lab Results  Component Value Date   NA 137 12/27/2021   K 4.4 12/27/2021   CO2 25 12/27/2021   GLUCOSE 141 (H) 12/27/2021   BUN 27 (H) 12/27/2021   CREATININE 1.79 (H) 12/27/2021   CALCIUM 9.5 12/27/2021   EGFR 38 (L) 06/25/2021   GFRNONAA 40 (L) 12/27/2021   Lab Results  Component Value Date   WBC 21.9 (H) 12/27/2021   HGB 14.7 12/27/2021   HCT 46.4 12/27/2021   MCV 91.2 12/27/2021   PLT 166 12/27/2021   CT ab/pelvis 1. Patchy infiltrates in the right lower lobe, new from the previous exam, concerning for pneumonia. 2. Right renal atrophy with renal cortical scarring bilaterally. 3. Short-segment of colonic wall thickening in the left lower quadrant similar in appearance to the prior exam. Colonoscopy is recommended for further evaluation on follow-up. 4. Moderate amount of retained stool in the colon suggesting constipation. 5. Splenomegaly. 6. Aortic atherosclerosis.  EKG: v-paced, no st elevations  Assessment and Plan: RLL PNA Acute on chronic hypoxic respiratory failure Chest pain     - placed in obs, tele     - pain is likely pleuritic     - trend trp; check echo     - EKG as above     - started on rocephin/azithro continue     - guaifenesin, PRN nebs, wean O2 as able (uses 2L qHS at baseline)  LLQ abdominal pain     - CT ab above; the colonic thickening in the LLQ was noted before and is not giving an inflammatory appearance; will hold on addition of flagyl until  eval'd by GI     - he has a c-scope scheduled for 02/27/22 w/ Dr. Paulita Fujita     - spoke with [facility], they will see him; appreciate assistance  Constipation     - BM regimen, follow  CKD 3b     - at baseline; watch nephrotoxins  COPD     - continue home regimen when confirmed  HFpEF PAH HTN     - continue home regimen when confirmed  Hypothyroidism     - continue home regimen when confirmed  PAF     - hold eliquis for c-scope     - pharm consult for heparin  GERD     - continue home regimen when confirmed  DM2     - check A1c     - SSI, DM diet, glucose checks  HLD CAD     - continue home regimen when confirmed  Advance Care Planning:   Code Status: DNI  Consults: [facility], Cardiology  Family Communication: None at bedside  Severity of Illness: The appropriate patient status for this patient is OBSERVATION. Observation status is judged to be reasonable and necessary in order to provide the required intensity of service to ensure the patient's safety. The patient's presenting symptoms, physical exam findings, and initial radiographic and laboratory data in the context of their medical condition is felt to place them at decreased risk for  further clinical deterioration. Furthermore, it is anticipated that the patient will be medically stable for discharge from the hospital within 2 midnights of admission.   Time spent in coordination of this H&P: 64 minutes  Author: Tyrone A Kyle, DO 12/28/2021 7:41 AM  For on call review [email or web address].  

## 2021-12-28 NOTE — Progress Notes (Addendum)
ANTICOAGULATION CONSULT NOTE - Initial Consult  Pharmacy Consult for heparin Indication: atrial fibrillation  Allergies  Allergen Reactions   Meloxicam Rash and Other (See Comments)    Red Man Syndrome, also   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 syndrome 09/02/15, resolved with diphenhydramine and slowing of rate    Patient Measurements:   Heparin Dosing Weight: 76 kg  Vital Signs: Temp: 98.1 F (36.7 C) (09/01 1137) Temp Source: Oral (09/01 1137) BP: 123/66 (09/01 1100) Pulse Rate: 59 (09/01 1000)  Labs: Recent Labs    12/27/21 2304 12/27/21 2311  HGB 14.7  --   HCT 46.4  --   PLT 166  --   CREATININE  --  1.79*  TROPONINIHS 18*  --     CrCl cannot be calculated (Unknown ideal weight.).   Medical History: Past Medical History:  Diagnosis Date   Aortic stenosis, mild    Arthritis of hand    "just a little bit in both hands" (03/31/2012)   Asthma    "little bit" (03/31/2012)   Atrial fibrillation ([place])    dx '04; DCCV '04, placed on flecainide, failed DCCV 04/2010, flecainide stopped; s/p successful A.fib ablation 01/31/12   CHF (congestive heart failure) ([place])    Controlled type 2 diabetes mellitus without complication, without long-term current use of insulin ([place]) 12/13/2019   COPD (chronic obstructive pulmonary disease) (HCC)    DJD (degenerative joint disease)    Fibromyalgia    GERD (gastroesophageal reflux disease)    Hyperlipidemia    Hypertension    Hypothyroidism    S/P radiation   Left atrial enlargement    LA size 60m by echo 11/21/11   Mitral regurgitation    trivial   Obstructive sleep apnea    mild by sleep study 2013; pt stated he does not have a machine because "it wasn't bad enough for him to have one"   Pacemaker 03/31/2012   Restless leg syndrome    Second degree AV block    Splenomegaly    Visit for monitoring Tikosyn therapy 04/16/2016    Medications:  Eliquis 5 mg BID - last dose reported 8/29 pm  Assessment: 71year  old male presented with chest and abdominal pain. Plan for patient to have colonoscopy for which Eliquis will need to be held. Pharmacy consulted for management of heparin.  Baseline labs pending. Given reported lasts dose Eliquis 8/29 pm, may be able to follow heparin level vs aPTT.  Goal of Therapy:  Heparin level 0.3-0.7 units/ml aPTT 66-102 seconds Monitor platelets by anticoagulation protocol: Yes   Plan:  -Heparin 4500 unit bolus -Start heparin infusion at 1050 units/hr -F/u baseline HL to determine need to follow aPTT vs HL -Daily CBC, HL -Monitor for signs/symptoms bleeding -Once colonoscopy scheduled, GI to comment on when to hold heparin pre-procedure  Addendum - baseline HL 0.24, will follow aPTT for now.  ATawnya Crook PharmD, BCPS Clinical Pharmacist 12/28/2021 12:02 PM

## 2021-12-28 NOTE — ED Notes (Signed)
Report received by previous RN. Pt continues to c/o CP, has not changed since arrival. Given 1 mg dilaudid IV. VS WDL. Continues on chronic 2 L nasal cannula. Spouse at bedside taking home pts belongings. Pt aware that he is being admitted to hospital. Awaiting hospitalitis to see pt.

## 2021-12-29 ENCOUNTER — Encounter (HOSPITAL_COMMUNITY): Payer: Self-pay | Admitting: Internal Medicine

## 2021-12-29 DIAGNOSIS — R1013 Epigastric pain: Secondary | ICD-10-CM | POA: Diagnosis not present

## 2021-12-29 DIAGNOSIS — I5032 Chronic diastolic (congestive) heart failure: Secondary | ICD-10-CM

## 2021-12-29 DIAGNOSIS — N1832 Chronic kidney disease, stage 3b: Secondary | ICD-10-CM | POA: Diagnosis not present

## 2021-12-29 DIAGNOSIS — I480 Paroxysmal atrial fibrillation: Secondary | ICD-10-CM

## 2021-12-29 DIAGNOSIS — K5900 Constipation, unspecified: Secondary | ICD-10-CM

## 2021-12-29 DIAGNOSIS — J189 Pneumonia, unspecified organism: Secondary | ICD-10-CM | POA: Diagnosis not present

## 2021-12-29 LAB — COMPREHENSIVE METABOLIC PANEL
ALT: 15 U/L (ref 0–44)
AST: 16 U/L (ref 15–41)
Albumin: 4.1 g/dL (ref 3.5–5.0)
Alkaline Phosphatase: 83 U/L (ref 38–126)
Anion gap: 12 (ref 5–15)
BUN: 28 mg/dL — ABNORMAL HIGH (ref 8–23)
CO2: 28 mmol/L (ref 22–32)
Calcium: 9.5 mg/dL (ref 8.9–10.3)
Chloride: 101 mmol/L (ref 98–111)
Creatinine, Ser: 1.72 mg/dL — ABNORMAL HIGH (ref 0.61–1.24)
GFR, Estimated: 42 mL/min — ABNORMAL LOW (ref >60)
Glucose, Bld: 90 mg/dL (ref 70–99)
Potassium: 4.4 mmol/L (ref 3.5–5.1)
Sodium: 141 mmol/L (ref 135–145)
Total Bilirubin: 1.9 mg/dL — ABNORMAL HIGH (ref 0.3–1.2)
Total Protein: 7.5 g/dL (ref 6.5–8.1)

## 2021-12-29 LAB — GLUCOSE, CAPILLARY
Glucose-Capillary: 102 mg/dL — ABNORMAL HIGH (ref 70–99)
Glucose-Capillary: 158 mg/dL — ABNORMAL HIGH (ref 70–99)
Glucose-Capillary: 76 mg/dL (ref 70–99)
Glucose-Capillary: 104 mg/dL — ABNORMAL HIGH (ref 70–99)

## 2021-12-29 LAB — CBC
HCT: 44.6 % (ref 39.0–52.0)
Hemoglobin: 14.3 g/dL (ref 13.0–17.0)
MCH: 29 pg (ref 26.0–34.0)
MCHC: 32.1 g/dL (ref 30.0–36.0)
MCV: 90.5 fL (ref 80.0–100.0)
Platelets: 158 10*3/uL (ref 150–400)
RBC: 4.93 MIL/uL (ref 4.22–5.81)
RDW: 17.8 % — ABNORMAL HIGH (ref 11.5–15.5)
WBC: 13.9 10*3/uL — ABNORMAL HIGH (ref 4.0–10.5)
nRBC: 0 % (ref 0.0–0.2)

## 2021-12-29 LAB — HEPARIN LEVEL (UNFRACTIONATED): Heparin Unfractionated: 0.82 IU/mL — ABNORMAL HIGH (ref 0.30–0.70)

## 2021-12-29 LAB — APTT
aPTT: 61 seconds — ABNORMAL HIGH (ref 24–36)
aPTT: 70 seconds — ABNORMAL HIGH (ref 24–36)

## 2021-12-29 MED ORDER — PEG 3350-KCL-NA BICARB-NACL 420 G PO SOLR
4000.0000 mL | Freq: Once | ORAL | Status: AC
Start: 2021-12-29 — End: 2021-12-29
  Administered 2021-12-29: 4000 mL via ORAL

## 2021-12-29 NOTE — Consult Note (Signed)
Cardiology Consultation:   Patient ID: [name]; [id number]; [birth year]   Admit date: 12/27/2021 Date of Consult: 12/29/2021  Primary Care Provider: Velna Hatchet, MD Primary Cardiologist: Dr. Haroldine Laws Primary Electrophysiologist:  (Was Dr. Rayann Heman)   Patient Profile:   [name] is a 71 y.o. male  with a hx of chronic respiratory failure on 2L oxygen, persistent atrial fibrillation s/p ablation in 2013 & 03/2020 and multiple cardioversions, COPD, hypertension, aortic stenosis s/p AVR and maze, complete heart block s/p pacemaker implantation, chronic diastolic heart function with RV failure/pulmonary hypertension, CKD stage III and diabetes mellitus who is being seen at the request of Dr. Maryland Pink for evaluation preop prior to colonoscopy.    History of Present Illness:   [name] presented with chest pain and left lower quadrant abdominal pain.  He was found to have minimally elevated nondiagnostic troponin.  EKG shows ventricular pacing with 100% capture.  GI is seeing him and is planning a colonoscopy this admission.  The family was worried about doing this and we are asked to comment on whether he can safely to through this.   We last saw the patient in March of this year.  This was for evaluation of chest pain.  This was not thought to be ischemic.    He is on home O2 at night.  He says during the day he can feel okay and is not having any acute shortness of breath recently.  He has had no new PND or orthopnea.  He is not feeling any palpitations, presyncope or syncope.  He has had no chest pain.  Has had no weight gain or edema.   Past Medical History:  Diagnosis Date   Aortic stenosis, mild    Arthritis of hand    "just a little bit in both hands" (03/31/2012)   Asthma    "little bit" (03/31/2012)   Atrial fibrillation ([place])    dx '04; DCCV '04, placed on flecainide, failed DCCV 04/2010, flecainide stopped; s/p successful A.fib ablation 01/31/12   CHF  (congestive heart failure) ([place])    Controlled type 2 diabetes mellitus without complication, without long-term current use of insulin ([place]) 12/13/2019   COPD (chronic obstructive pulmonary disease) (H[facility])    DJD (degenerative joint disease)    Fibromyalgia    GERD (gastroesophageal reflux disease)    Hyperlipidemia    Hypertension    Hypothyroidism    S/P radiation   Left atrial enlargement    LA size 33m by echo 11/21/11   Mitral regurgitation    trivial   Obstructive sleep apnea    mild by sleep study 2013; pt stated he does not have a machine because "it wasn't bad enough for him to have one"   Pacemaker 03/31/2012   Restless leg syndrome    Second degree AV block    Splenomegaly    Visit for monitoring Tikosyn therapy 04/16/2016    Past Surgical History:  Procedure Laterality Date   AORTIC VALVE REPLACEMENT N/A 08/22/2015   Procedure: AORTIC VALVE REPLACEMENT (AVR);  Surgeon: PIvin Poot MD;  [facility] [facility]  Service: Open Heart Surgery;  Laterality: N/A;   ATRIAL FIBRILLATION ABLATION  01/30/2012   PVI by Dr. ARayann Heman  ATRIAL FIBRILLATION ABLATION N/A 01/31/2012   Procedure: ATRIAL FIBRILLATION ABLATION;  Surgeon: JThompson Grayer MD;  [facility];  Service: Cardiovascular;  Laterality: N/A;   ATRIAL FIBRILLATION ABLATION N/A 04/19/2020   Procedure: ATRIAL FIBRILLATION ABLATION;  Surgeon: ARayann Heman  Jeneen Rinks, MD;  Location: [place] CV LAB;  Service: Cardiovascular;  Laterality: N/A;   BACK SURGERY     X 3   CARDIAC CATHETERIZATION N/A 08/09/2015   Procedure: Right/Left Heart Cath and Coronary Angiography;  Surgeon: Peter M Martinique, MD;  Location: [place] CV LAB;  Service: Cardiovascular;  Laterality: N/A;   CARDIAC CATHETERIZATION N/A 02/05/2016   Procedure: Right/Left Heart Cath and Coronary Angiography;  Surgeon: Jettie Booze, MD;  Location: [place] [facility] LAB;  Service: Cardiovascular;  Laterality: N/A;   CARDIAC CATHETERIZATION N/A 04/17/2016    Procedure: Right Heart Cath;  Surgeon: Jolaine Artist, MD;  Location: [place] CV LAB;  Service: Cardiovascular;  Laterality: N/A;   CARDIOVASCULAR STRESS TEST  03/12/2002   EF 48%, NO EVIDENCE OF ISCHEMIA   CARDIOVERSION  01/2012; 03/31/2012   CARDIOVERSION N/A 02/25/2012   Procedure: CARDIOVERSION;  Surgeon: Thompson Grayer, MD;  Location: [facility] C[facility] LAB;  Service: Cardiovascular;  Laterality: N/A;   CARDIOVERSION N/A 03/31/2012   Procedure: CARDIOVERSION;  Surgeon: Thompson Grayer, MD;  Location: [facility] CATH LAB;  Service: Cardiovascular;  Laterality: N/A;   CARDIOVERSION N/A 11/23/2015   Procedure: CARDIOVERSION;  Surgeon: Larey Dresser, MD;  Location: [place];  Service: Cardiovascular;  Laterality: N/A;   CARDIOVERSION N/A 04/08/2016   Procedure: CARDIOVERSION;  Surgeon: Jolaine Artist, MD;  Location: [place];  Service: Cardiovascular;  Laterality: N/A;   CARDIOVERSION N/A 09/14/2018   Procedure: CARDIOVERSION;  Surgeon: Josue Hector, MD;  Location: [place];  Service: Cardiovascular;  Laterality: N/A;   CARDIOVERSION N/A 10/19/2019   Procedure: CARDIOVERSION;  Surgeon: Jolaine Artist, MD;  Location: [facility] ENDOSCOPY;  Service: Cardiovascular;  Laterality: N/A;   CARDIOVERSION N/A 02/25/2020   Procedure: CARDIOVERSION;  Surgeon: Fay Records, MD;  Location: [place];  Service: Cardiovascular;  Laterality: N/A;   CARDIOVERSION N/A 05/25/2020   Procedure: CARDIOVERSION;  Surgeon: Lelon Perla, MD;  Location: [facility] ENDOSCOPY;  Service: Cardiovascular;  Laterality: N/A;   CARDIOVERSION N/A 07/10/2020   Procedure: CARDIOVERSION;  Surgeon: Jerline Pain, MD;  Location: [place];  Service: Cardiovascular;  Laterality: N/A;   CLIPPING OF ATRIAL APPENDAGE N/A 08/22/2015   Procedure: CLIPPING OF ATRIAL APPENDAGE;  Surgeon: Ivin Poot, MD;  Location: [place];  Service: Open Heart Surgery;  Laterality: N/A;   DOPPLER ECHOCARDIOGRAPHY  03/11/2002   EF 70-75%   FINGER  SURGERY Left    Middle finger   FINGER TENDON REPAIR  1980's   "right little finger" (03/31/2012)   INSERT / REPLACE / REMOVE PACEMAKER     [employer]   IR RADIOLOGIST EVAL & MGMT  05/06/2019   IR RADIOLOGIST EVAL & MGMT  10/06/2019   IR RADIOLOGIST EVAL & MGMT  10/31/2020   IR RADIOLOGIST EVAL & MGMT  01/18/2021   IR RADIOLOGIST EVAL & MGMT  03/08/2021   IR RADIOLOGIST EVAL & MGMT  06/13/2021   KIDNEY SURGERY Left    LEFT AND RIGHT HEART CATHETERIZATION WITH CORONARY ANGIOGRAM N/A 10/27/2012   Procedure: LEFT AND RIGHT HEART CATHETERIZATION WITH CORONARY ANGIOGRAM;  Surgeon: Laverda Page, MD;  Location: [facility] CATH LAB;  Service: Cardiovascular;  Laterality: N/A;   LUMBAR DISC SURGERY  1980's X2;  2000's   MAZE N/A 08/22/2015   Procedure: MAZE;  Surgeon: Ivin Poot, MD;  Location: [place];  Service: Open Heart Surgery;  Laterality: N/A;   PACEMAKER INSERTION  03/31/2012   STJ Accent DR pacemaker implanted by Dr  Allred   PERMANENT PACEMAKER INSERTION N/A 03/31/2012   Procedure: PERMANENT PACEMAKER INSERTION;  Surgeon: Thompson Grayer, MD;  Location: [facility] C[facility] LAB;  Service: Cardiovascular;  Laterality: N/A;   PPM GENERATOR CHANGEOUT N/A 08/08/2020   Procedure: PPM GENERATOR CHANGEOUT;  Surgeon: Thompson Grayer, MD;  Location: [place] CV LAB;  Service: Cardiovascular;  Laterality: N/A;   RADIOLOGY WITH ANESTHESIA Left 02/14/2021   Procedure: RADIOLOGY WITH ANESTHESIA LEFT RENAL CRYOABLATION;  Surgeon: Aletta Edouard, MD;  Location: [facility];  Service: Radiology;  Laterality: Left;   TEE WITHOUT CARDIOVERSION  01/30/2012   Procedure: TRANSESOPHAGEAL ECHOCARDIOGRAM (TEE);  Surgeon: Larey Dresser, MD;  Location: [place];  Service: Cardiovascular;  Laterality: N/A;  ablation next day   TEE WITHOUT CARDIOVERSION N/A 08/22/2015   Procedure: TRANSESOPHAGEAL ECHOCARDIOGRAM (TEE);  Surgeon: Ivin Poot, MD;  Location: [place];  Service: Open Heart Surgery;  Laterality: N/A;   TEE WITHOUT  CARDIOVERSION N/A 10/19/2019   Procedure: TRANSESOPHAGEAL ECHOCARDIOGRAM (TEE);  Surgeon: Jolaine Artist, MD;  Location: [facility] ENDOSCOPY;  Service: Cardiovascular;  Laterality: N/A;   US ECHOCARDIOGRAPHY  08/07/2009   EF 55-60%     Home Medications:  Prior to Admission medications   Medication Sig Start Date End Date Taking? Authorizing Provider  albuterol (PROVENTIL HFA;VENTOLIN HFA) 108 (90 Base) MCG/ACT inhaler Inhale 2 puffs into the lungs every 6 (six) hours as needed for wheezing or shortness of breath. 12/28/15  Yes Collene Gobble, MD  amLODipine (NORVASC) 5 MG tablet Take 2.5 mg by mouth daily.   Yes [provider]  apixaban (ELIQUIS) 5 MG TABS tablet Take 1 tablet (5 mg total) by mouth 2 (two) times daily. 05/11/21  Yes Allred, Jeneen Rinks, MD  bisoprolol (ZEBETA) 5 MG tablet Take 1 tablet (5 mg total) by mouth daily. Needs appt 11/19/21  Yes Bensimhon, Shaune Pascal, MD  Budeson-Glycopyrrol-Formoterol (BREZTRI AEROSPHERE) 160-9-4.8 MCG/ACT AERO Inhale 2 puffs into the lungs in the morning and at bedtime. 11/30/20  Yes Martyn Ehrich, NP  Calcium-Magnesium-Zinc (CAL-MAG-ZINC PO) Take 1 tablet by mouth daily with breakfast.   Yes [provider]  Cholecalciferol (VITAMIN D3 SUPER STRENGTH) 50 MCG (2000 UT) CAPS Take 8,000 Units by mouth at bedtime.   Yes [provider]  empagliflozin (JARDIANCE) 10 MG TABS tablet Take 1 tablet (10 mg total) by mouth daily before breakfast. Patient taking differently: Take 10 mg by mouth at bedtime. 03/14/20  Yes Bensimhon, Shaune Pascal, MD  fexofenadine (ALLEGRA) 180 MG tablet Take 1 tablet (180 mg total) by mouth daily as needed for allergies or rhinitis. 01/23/20  Yes Aline August, MD  gabapentin (NEURONTIN) 300 MG capsule Take 600-1,200 mg by mouth See admin instructions. Take 600 mg by mouth in the morning, 1,200 mg at bedtime, and an additional 600 mg once a day as needed for neuropathy 09/13/14  Yes [provider]   levothyroxine (SYNTHROID, LEVOTHROID) 175 MCG tablet Take 175 mcg by mouth daily before breakfast.   Yes [provider]  Menthol-Camphor (TIGER BALM ARTHRITIS RUB EX) Apply 1 application topically as needed (for leg pain).   Yes [provider]  montelukast (SINGULAIR) 10 MG tablet TAKE 1 TABLET(10 MG) BY MOUTH AT BEDTIME Patient taking differently: Take 10 mg by mouth at bedtime. 09/18/21  Yes Collene Gobble, MD  Multiple Vitamin (MULTIVITAMIN WITH MINERALS) TABS tablet Take 1 tablet by mouth daily. 12/15/19  Yes Sheikh, Omair Latif, DO  ondansetron (ZOFRAN-ODT) 4 MG disintegrating tablet Take 1 tablet (4  mg total) by mouth every 8 (eight) hours as needed for nausea or vomiting. 06/07/21  Yes Fredia Sorrow, MD  oxyCODONE-acetaminophen (PERCOCET/ROXICET) 5-325 MG tablet Take 1 tablet by mouth 2 (two) times daily.   Yes [provider]  oxymetazoline (AFRIN) 0.05 % nasal spray Place 1 spray into both nostrils as needed (for nose bleeds).   Yes [provider]  pantoprazole (PROTONIX) 40 MG tablet Take 40 mg by mouth daily before breakfast.   Yes [provider]  polyethylene glycol powder (GLYCOLAX/MIRALAX) 17 GM/SCOOP powder Take 17 g by mouth daily as needed for mild constipation. 01/23/20  Yes Aline August, MD  potassium chloride (KLOR-CON M) 10 MEQ tablet Take 10 mEq by mouth See admin instructions. Take 10 mEq by mouth two times a day on the days when Torsemide is taken   Yes [provider]  predniSONE (DELTASONE) 5 MG tablet Take 0.5 tablets (2.5 mg total) by mouth daily with breakfast. 10/22/21  Yes Byrum, Rose Fillers, MD  rOPINIRole (REQUIP) 1 MG tablet Take 1 tablet (1 mg total) by mouth in the morning and at bedtime. 01/23/20  Yes Aline August, MD  rosuvastatin (CRESTOR) 10 MG tablet Take 10 mg by mouth at bedtime.  08/08/16  Yes [provider]  spironolactone (ALDACTONE) 25 MG tablet Take 1 tablet (25 mg total) by mouth daily.  02/16/16  Yes Shirley Friar, PA-C  SYSTANE ULTRA PF 0.4-0.3 % SOLN Apply 1 drop to eye 3 (three) times daily as needed (for dryness).   Yes [provider]  torsemide (DEMADEX) 20 MG tablet Take 30 mg by mouth every other day.   Yes [provider]  traMADol (ULTRAM) 50 MG tablet Take 100 mg by mouth 2 (two) times daily. 10/26/20  Yes [provider]  traZODone (DESYREL) 50 MG tablet Take 50 mg by mouth 2 (two) times daily. 08/30/20  Yes [provider]  dicyclomine (BENTYL) 10 MG capsule Take 1 capsule (10 mg total) by mouth every 6 (six) hours as needed (For abdominal cramping). Patient not taking: Reported on 12/28/2021 06/08/21   Molpus, Jenny Reichmann, MD  LORazepam (ATIVAN) 1 MG tablet Take 1 tablet (1 mg total) by mouth every 8 (eight) hours as needed (nausea and/or restlessness). Patient not taking: Reported on 12/28/2021 08/09/20   Carmin Muskrat, MD  OXYGEN Inhale 2 L/min into the lungs See admin instructions. 2 L/min at bedtime and 2 L/min throughout the day as needed for shortness of breath    [provider]  promethazine (PHENERGAN) 25 MG tablet Take 1 tablet (25 mg total) by mouth every 6 (six) hours as needed for nausea or vomiting. Patient not taking: Reported on 12/28/2021 12/10/20   Drenda Freeze, MD    Inpatient Medications: Scheduled Meds:  amLODipine  2.5 mg Oral Daily   azithromycin  500 mg Oral Daily   gabapentin  600 mg Oral Daily   And   gabapentin  1,200 mg Oral QHS   guaiFENesin  600 mg Oral BID   insulin aspart  0-15 Units Subcutaneous TID WC   insulin aspart  0-5 Units Subcutaneous QHS   levothyroxine  175 mcg Oral QAC breakfast   mometasone-formoterol  2 puff Inhalation BID   And   umeclidinium bromide  1 puff Inhalation Daily   montelukast  10 mg Oral QHS   multivitamin with minerals  1 tablet Oral Daily   pantoprazole  40 mg Oral QAC breakfast   polyethylene glycol  17 g  Oral Daily   predniSONE  2.5 mg Oral Q  breakfast   rOPINIRole  1 mg Oral BID   rosuvastatin  10 mg Oral QHS   senna  1 tablet Oral BID   spironolactone  25 mg Oral Daily   torsemide  30 mg Oral QODAY   traZODone  50 mg Oral BID   Continuous Infusions:  cefTRIAXone (ROCEPHIN)  IV     heparin 1,350 Units/hr (12/29/21 0728)   PRN Meds: acetaminophen **OR** acetaminophen, albuterol, gabapentin, HYDROmorphone (DILAUDID) injection, ondansetron **OR** ondansetron (ZOFRAN) IV, oxyCODONE, polyvinyl alcohol  Allergies:    Allergies  Allergen Reactions   Meloxicam Rash and Other (See Comments)    Red Man Syndrome, also   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 syndrome 09/02/15, resolved with diphenhydramine and slowing of rate    Social History:   Social History   Socioeconomic History   Marital status: Married    Spouse name: Not on file   Number of children: Not on file   Years of education: Not on file   Highest education level: Not on file  Occupational History   Occupation: retired  Tobacco Use   Smoking status: Former    Packs/day: 1.00    Years: 25.00    Total pack years: 25.00    Types: Cigarettes    Start date: 1963    Quit date: 07/18/1991    Years since quitting: 30.4    Passive exposure: Never   Smokeless tobacco: Never  Vaping Use   Vaping Use: Never used  Substance and Sexual Activity   Alcohol use: Not Currently    Comment: occasional   Drug use: Yes    Types: Marijuana    Comment: routine use   Sexual activity: Not Currently  Other Topics Concern   Not on file  Social History Narrative   Lives in [place] with spouse.  Works as a Air traffic controller.   Social Determinants of Health   Financial Resource Strain: Not on file  Food Insecurity: Not on file  Transportation Needs: Not on file  Physical Activity: Not on file  Stress: Not on file  Social Connections: Not on file  Intimate Partner Violence: Not on file    Family History:    Family History  Problem Relation Age of  Onset   Heart failure Mother    Stroke Father    Neuropathy Neg Hx      ROS:  Please see the history of present illness.  All other ROS reviewed and negative.     Physical Exam/Data:   Vitals:   12/28/21 1432 12/28/21 2015 12/28/21 2025 12/29/21 0035  BP: (!) 121/93  131/69 112/60  Pulse: 64  (!) 59 61  Resp: '18  18 18  '$ Temp: 98 F (36.7 C)  98.8 F (37.1 C) 98.8 F (37.1 C)  TempSrc: Oral  Oral Oral  SpO2: 95% 97% 98% 99%  Weight:      Height:        Intake/Output Summary (Last 24 hours) at 12/29/2021 0729 Last data filed at 12/29/2021 0610 Gross per 24 hour  Intake 1046.05 ml  Output 2550 ml  Net -1503.95 ml   Filed Weights   12/28/21 1316  Weight: 74.1 kg   Body mass index is 22.78 kg/m.  GENERAL:  Wellappearing HEENT:   Pupils equal round and reactive, fundi not visualized, oral mucosa unremarkable NECK:  No  jugular venous distention, waveform within normal limits, carotid upstroke brisk  and symmetric, no bruits, no thyromegaly LYMPHATICS:  No cervical, inguinal adenopathy LUNGS:   Clear to auscultation bilaterally BACK:  No CVA tenderness CHEST:   Unremarkable HEART:  PMI not displaced or sustained,S1 and S2 within normal limits, no S3, no S4, no clicks, no rubs, soft apical systolic murmur slightly radiating at the right upper tract, no diastolic murmurs ABD:  Flat, positive bowel sounds normal in frequency in pitch, no bruits, no rebound, no guarding, tender to palpation, no midline pulsatile mass, no hepatomegaly, no splenomegaly EXT:  2 plus pulses throughout, no  edema, no cyanosis no clubbing SKIN:  No rashes no nodules NEURO:   Cranial nerves II through XII grossly intact, motor grossly intact throughout PSYCH:    Cognitively intact, oriented to person place and time   EKG:  The EKG was personally reviewed and demonstrates: Atrial fibrillation with ventricular pacing Telemetry:  Telemetry was personally reviewed and demonstrates: Atrial fibrillation  with ventricular pacing  Relevant CV Studies:  ECHO  1. Left ventricular ejection fraction, by estimation, is 55 to 60%. The  left ventricle has normal function. The left ventricle has no regional  wall motion abnormalities. Left ventricular diastolic parameters are  indeterminate.   2. Right ventricular systolic function is normal. The right ventricular  size is normal. The estimated right ventricular systolic pressure is 37.1  mmHg.   3. The mitral valve is normal in structure. Trivial mitral valve  regurgitation. No evidence of mitral stenosis.   4. The aortic valve has been repaired/replaced. Aortic valve  regurgitation is not visualized. No aortic stenosis is present. There is a  bioprosthetic valve present in the aortic position. Aortic valve area, by  VTI measures 1.63 cm. Aortic valve Vmax  measures 2.28 m/s. Aortic valve mean gradient measures 11.0 mmHg. Aortic  valve peak gradient measures 20.8 mmHg.   5. There is mild dilatation of the ascending aorta, measuring 38 mm.   6. The inferior vena cava is normal in size with greater than 50%  respiratory variability, suggesting right atrial pressure of 3 mmHg.   7. Compared to echo 12/13/2019, the mean AVG has increased from 8 to  59mhg. DVi has dcreased from 0.65 to 0.57 and AVA has decreased from  2.04cm2 to 1.63cm2. The AVA calculation is likely underestimated due to  small LVOT measurement.   8. Left atrial size was mildly dilated.   Laboratory Data:  Chemistry Recent Labs  Lab 12/27/21 2311 12/29/21 0514  NA 137 141  K 4.4 4.4  CL 104 101  CO2 25 28  GLUCOSE 141* 90  BUN 27* 28*  CREATININE 1.79* 1.72*  CALCIUM 9.5 9.5  GFRNONAA 40* 42*  ANIONGAP 8 12    Recent Labs  Lab 12/27/21 2311 12/29/21 0514  PROT 7.4 7.5  ALBUMIN 4.1 4.1  AST 20 16  ALT 17 15  ALKPHOS 84 83  BILITOT 1.7* 1.9*   Hematology Recent Labs  Lab 12/27/21 2304 12/29/21 0514  WBC 21.9* 13.9*  RBC 5.09 4.93  HGB 14.7 14.3   HCT 46.4 44.6  MCV 91.2 90.5  MCH 28.9 29.0  MCHC 31.7 32.1  RDW 17.7* 17.8*  PLT 166 158   Cardiac EnzymesNo results for input(s): "TROPONINI" in the last 168 hours. No results for input(s): "TROPIPOC" in the last 168 hours.  BNPNo results for input(s): "BNP", "PROBNP" in the last 168 hours.  DDimer No results for input(s): "DDIMER" in the last 168 hours.  Radiology/Studies:  CT ABDOMEN PELVIS W  CONTRAST  Result Date: 12/28/2021 CLINICAL DATA:  Right-sided abdominal pain with nausea. EXAM: CT ABDOMEN AND PELVIS WITH CONTRAST TECHNIQUE: Multidetector CT imaging of the abdomen and pelvis was performed using the standard protocol following bolus administration of intravenous contrast. RADIATION DOSE REDUCTION: This exam was performed according to the departmental dose-optimization program which includes automated exposure control, adjustment of the mA and/or kV according to patient size and/or use of iterative reconstruction technique. CONTRAST:  95m OMNIPAQUE IOHEXOL 300 MG/ML  SOLN COMPARISON:  12/04/2021. FINDINGS: Lower chest: The heart is normal in size and pacemaker leads are noted in the right heart. No pericardial effusion. Patchy infiltrates are present in the right lower lobe. A few ground-glass opacities are noted in the medial aspect of the left lower lobe. Hepatobiliary: No focal liver abnormality. A stone is noted in the gallbladder. No biliary ductal dilatation. Pancreas: Unremarkable. No pancreatic ductal dilatation or surrounding inflammatory changes. Spleen: The spleen is enlarged measuring 16.7 cm in length. Adrenals/Urinary Tract: No adrenal nodule or mass. There is atrophy and cortical scarring involving the right kidney. There is cortical scarring in the mid left kidney with associated fat stranding, unchanged from the prior exam. Subcentimeter hypodensities are noted in the kidneys bilaterally which are too small to further characterize. No renal calculus or hydronephrosis. The  bladder is unremarkable. Stomach/Bowel: Stomach is within normal limits. Appendix appears normal. No bowel obstruction or surrounding inflammatory changes. There is redemonstration of a short-segment of sigmoid colon in the left lower quadrant with mild wall thickening, not significantly changed from the prior exam. No free air or pneumatosis. A moderate amount of retained stool is present in the colon compatible with constipation. Vascular/Lymphatic: Aortic atherosclerosis. No enlarged abdominal or pelvic lymph nodes. Reproductive: Prostate is unremarkable. Other: No abdominopelvic ascites. Fat containing inguinal hernias are present bilaterally. There is a fat containing umbilical hernia. Musculoskeletal: Degenerative changes are present in the thoracolumbar spine. No acute or suspicious osseous abnormality. IMPRESSION: 1. Patchy infiltrates in the right lower lobe, new from the previous exam, concerning for pneumonia. 2. Right renal atrophy with renal cortical scarring bilaterally. 3. Short-segment of colonic wall thickening in the left lower quadrant similar in appearance to the prior exam. Colonoscopy is recommended for further evaluation on follow-up. 4. Moderate amount of retained stool in the colon suggesting constipation. 5. Splenomegaly. 6. Aortic atherosclerosis. Electronically Signed   By: LBrett FairyM.D.   On: 12/28/2021 01:30   DG Chest 2 View  Result Date: 12/27/2021 CLINICAL DATA:  Chest pain and nausea. EXAM: CHEST - 2 VIEW COMPARISON:  June 08, 2021 FINDINGS: Multiple sternal wires are noted. A dual lead AICD is seen. The cardiac silhouette is borderline in size. An artificial cardiac valve is seen. An atrial appendage clip is also noted. There is moderate severity calcification of the aortic arch. There is no evidence of an acute infiltrate, pleural effusion or pneumothorax. The visualized skeletal structures are unremarkable. IMPRESSION: No active cardiopulmonary disease.  Electronically Signed   By: TVirgina NorfolkM.D.   On: 12/27/2021 23:27    Assessment and Plan:   Abdominal pain: The patient is noted to have colonic thickening.  Colonoscopy is is planned at some point.  At this point there is no contraindication to colonoscopy.  He last saw Dr. BHaroldine Lawsin in July of this year and actually had stable chronic diastolic heart failure.  He had some pulmonary venous hypertension that was stable.    Respiratory failure: The patient is being managed for  pneumonia.  Plan per primary team.  HFpEF: When recently seen he has actually had stable chronic diastolic heart failure.  PAF: The patient is okay to hold Eliquis as needed for any procedures.  Resume when felt stable from a medical standpoint.  This is permanent atrial fibrillation status post maze and multiple PVI's.  He is not a candidate for further EP procedures.  CAD: He is actually when cath had mild nonobstructive coronary disease though this was years ago.  He is not having active ischemia symptoms.  Troponin is minimally elevated nondiagnostic.  No further work-up.  HB: He has had symptomatic complete heart block with pacing and will have continued follow-up in our neck.  AVR: He had stable valve replacement demonstrated on echo earlier this year.   For questions or updates, please contact [place] Please consult [email or web address] for contact info under Cardiology/STEMI.   Signed, Minus Breeding, MD  12/29/2021 7:29 AM

## 2021-12-29 NOTE — Progress Notes (Signed)
Drank 3500 cc of prep without bowel movt  emesis at 2230 clear/bilious approx 100cc relief with zofran

## 2021-12-29 NOTE — Progress Notes (Signed)
FYI--pt continues to BARELY drink his prep. Encouragement continues. Pt stating he is going to drink the Nulytely, but has barely started the second jug. No BM's this shift. 4 cups of Nulytely sitting at bedside waiting to be emptied for refills.  Will continue to stress the need.

## 2021-12-29 NOTE — Progress Notes (Signed)
ANTICOAGULATION CONSULT NOTE - Follow Up Consult  Pharmacy Consult for Heparin Indication: atrial fibrillation  Allergies  Allergen Reactions   Meloxicam Rash and Other (See Comments)    Red Man Syndrome, also   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 syndrome 09/02/15, resolved with diphenhydramine and slowing of rate    Patient Measurements: Height: '5\' 11"'$  (180.3 cm) Weight: 74.1 kg (163 lb 4.8 oz) IBW/kg (Calculated) : 75.3 Heparin Dosing Weight:    Vital Signs: Temp: 98.3 F (36.8 C) (09/02 1237) Temp Source: Oral (09/02 1237) BP: 124/69 (09/02 1237) Pulse Rate: 62 (09/02 1237)  Labs: Recent Labs    12/27/21 2304 12/27/21 2311 12/28/21 1154 12/28/21 1154 12/28/21 1328 12/28/21 2051 12/29/21 0514 12/29/21 1339  HGB 14.7  --   --   --   --   --  14.3  --   HCT 46.4  --   --   --   --   --  44.6  --   PLT 166  --   --   --   --   --  158  --   APTT  --   --  34   < >  --  61* 61* 70*  LABPROT  --   --  17.3*  --   --   --   --   --   INR  --   --  1.4*  --   --   --   --   --   HEPARINUNFRC  --   --  0.24*  --   --   --  0.82*  --   CREATININE  --  1.79*  --   --   --   --  1.72*  --   TROPONINIHS 18*  --   --   --  18*  --   --   --    < > = values in this interval not displayed.     Estimated Creatinine Clearance: 40.7 mL/min (A) (by C-G formula based on SCr of 1.72 mg/dL (H)).   Assessment: AC/Heme: PTA Eliquis-HOLD for colonoscopy. Bridge with IV heparin. aPTT now therapeutic after increase in IV heparin rate this morning from 12 to 1350 units/hr Per RN, no issues or bleeding  Goal of Therapy:  aPTT 66-102 seconds Monitor platelets by anticoagulation protocol: Yes   Plan:  Continue IV heparin at same rate of 1350 units/hr Daily aPTT, HL, and CBC Noted plan to hold IV Heparin in the morning at 0600 for colonscopy    Adrian Saran, PharmD, BCPS Secure Chat if ?s 12/29/2021 2:55 PM

## 2021-12-29 NOTE — Progress Notes (Addendum)
Patient ID: [name], male   DOB: 1951-01-29, 71 y.o.   MRN: [id number] [facility] Gastroenterology Progress Note  [name] 71 y.o. 07/20/1950   Subjective: No BMs after drinking most of colon prep. Was on solid food yesterday. Vomited small amount last night. Wife in room.  Objective: Vital signs: Vitals:   12/28/21 2025 12/29/21 0035  BP: 131/69 112/60  Pulse: (!) 59 61  Resp: 18 18  Temp: 98.8 F (37.1 C) 98.8 F (37.1 C)  SpO2: 98% 99%    Physical Exam: Gen: lethargic, no acute distress, well-nourished, pleasant HEENT: anicteric sclera CV: RRR Chest: CTA B Abd: distended, diffuse tenderness with guarding, +BS Ext: no edema  Lab Results: Recent Labs    12/27/21 2311 12/29/21 0514  NA 137 141  K 4.4 4.4  CL 104 101  CO2 25 28  GLUCOSE 141* 90  BUN 27* 28*  CREATININE 1.79* 1.72*  CALCIUM 9.5 9.5   Recent Labs    12/27/21 2311 12/29/21 0514  AST 20 16  ALT 17 15  ALKPHOS 84 83  BILITOT 1.7* 1.9*  PROT 7.4 7.5  ALBUMIN 4.1 4.1   Recent Labs    12/27/21 2304 12/29/21 0514  WBC 21.9* 13.9*  HGB 14.7 14.3  HCT 46.4 44.6  MCV 91.2 90.5  PLT 166 158      Assessment/Plan: 71 yo with abdominal pain weight loss and abnormal imaging showing short segment of colonic wall thickening of the sigmoid colon in need of an updated colonoscopy. Cleared by cardiology to have colonoscopy. Eliquis on hold. No BMs with colon prep yesterday and was on solid food. Clear liquid diet started. Additional colon prep (another gallon) today and will do colonoscopy tomorrow morning if cleaned out and if not cleaned out will do flex sig to evaluate sigmoid colon, which is the area of concern on CT. NPO p MN. Will hold IV Heparin 4 hours prior to colonoscopy. Dr. Maryland Pink and nurse aware of plan.   [name] 12/29/2021, 11:20 AM  Questions please call [phone]

## 2021-12-29 NOTE — H&P (View-Only) (Signed)
Patient ID: AVERY KLINGBEIL, male   DOB: 1951/04/10, 71 y.o.   MRN: [id number] [facility] Gastroenterology Progress Note  FRANK NOVELO 71 y.o. Jan 10, 1951   Subjective: No BMs after drinking most of colon prep. Was on solid food yesterday. Vomited small amount last night. Wife in room.  Objective: Vital signs: Vitals:   12/28/21 2025 12/29/21 0035  BP: 131/69 112/60  Pulse: (!) 59 61  Resp: 18 18  Temp: 98.8 F (37.1 C) 98.8 F (37.1 C)  SpO2: 98% 99%    Physical Exam: Gen: lethargic, no acute distress, well-nourished, pleasant HEENT: anicteric sclera CV: RRR Chest: CTA B Abd: distended, diffuse tenderness with guarding, +BS Ext: no edema  Lab Results: Recent Labs    12/27/21 2311 12/29/21 0514  NA 137 141  K 4.4 4.4  CL 104 101  CO2 25 28  GLUCOSE 141* 90  BUN 27* 28*  CREATININE 1.79* 1.72*  CALCIUM 9.5 9.5   Recent Labs    12/27/21 2311 12/29/21 0514  AST 20 16  ALT 17 15  ALKPHOS 84 83  BILITOT 1.7* 1.9*  PROT 7.4 7.5  ALBUMIN 4.1 4.1   Recent Labs    12/27/21 2304 12/29/21 0514  WBC 21.9* 13.9*  HGB 14.7 14.3  HCT 46.4 44.6  MCV 91.2 90.5  PLT 166 158      Assessment/Plan: 71 yo with abdominal pain weight loss and abnormal imaging showing short segment of colonic wall thickening of the sigmoid colon in need of an updated colonoscopy. Cleared by cardiology to have colonoscopy. Eliquis on hold. No BMs with colon prep yesterday and was on solid food. Clear liquid diet started. Additional colon prep (another gallon) today and will do colonoscopy tomorrow morning if cleaned out and if not cleaned out will do flex sig to evaluate sigmoid colon, which is the area of concern on CT. NPO p MN. Will hold IV Heparin 4 hours prior to colonoscopy. Dr. Maryland Pink and nurse aware of plan.   [name] 12/29/2021, 11:20 AM  Questions please call [phone]

## 2021-12-29 NOTE — Progress Notes (Signed)
[facility] PROGRESS NOTE   CHRISTON PARADA GHW:[id number] DOB: [birth year] DOA: 12/27/2021  PCP: Velna Hatchet, MD  Brief History/Interval Summary: 71 y.o. male with medical history significant for a fib on eliquis, CKD 3b, chronic diastolic HF, DM, COPD on 2L qHS, HTN, HLD, hypothyroidism, CHB s/p PPM. Presenting with right chest pain and LLQ abdominal pain.  Found to have right-sided pneumonia.  CT scan raised concern for colonic wall thickening.  Patient was hospitalized for further management.    Consultants: [facility] gastroenterology.  Cardiology.  Procedures: None yet    Subjective/Interval History: Patient mentioned that his right-sided chest pain has significantly improved.  Has a cough with clear expectoration.  Denies any shortness of breath at this time.  Uses oxygen at home.  He does complain of left-sided abdominal pain.  No nausea or vomiting.    Assessment/Plan:  Community-acquired pneumonia/chronic respiratory failure with hypoxia/pleuritic chest pain Chest pain most likely secondary to pneumonia noted on CT scan.  Venous thromboembolism seems less likely since the patient is already on apixaban. Continue with antibiotics.  Patient is feeling better today.  WBC is better.  Already on home oxygen.  Left lower quadrant abdominal pain/colonic wall thickening Apparently this has been ongoing for some time.  Plan was for outpatient colonoscopy in the next few months.  However patient does report weight loss of about 40 pounds in the last few months.  Since he is symptomatic a gastroenterology was consulted.  Plan is for inpatient colonoscopy as long as he is cleared by cardiology.  Chronic diastolic CHF/history of aortic valve stenosis status post replacement/coronary artery disease Followed by cardiology.  Last echocardiogram from February showed normal systolic function.  Seems to be fairly euvolemic at this time.  Paroxysmal atrial fibrillation Not noted to be  on any rate limiting medications.  Was on apixaban prior to admission which is currently on hold for possible colonoscopy in the next 1 to 2 days.  Constipation Started on bowel regimen.  Chronic kidney disease stage IIIb Seems to be close to baseline.  Monitor urine output.  Avoid nephrotoxic agents.  History of COPD on chronic steroids Stable.  Continue prednisone.  Hypothyroidism Continue home medication regimen.  Diabetes mellitus type 2 HbA1c 6.5.  Continue SSI.  Noted to be on Jardiance prior to admission.  DVT Prophylaxis: On IV heparin currently Code Status: Full code Family Communication: Discussed with patient and his wife Disposition Plan: Hopefully return home when improved  Status is: Inpatient Remains inpatient appropriate because: Pneumonia, abdominal pain, weight loss      Medications: Scheduled:  amLODipine  2.5 mg Oral Daily   azithromycin  500 mg Oral Daily   gabapentin  600 mg Oral Daily   And   gabapentin  1,200 mg Oral QHS   guaiFENesin  600 mg Oral BID   insulin aspart  0-15 Units Subcutaneous TID WC   insulin aspart  0-5 Units Subcutaneous QHS   levothyroxine  175 mcg Oral QAC breakfast   mometasone-formoterol  2 puff Inhalation BID   And   umeclidinium bromide  1 puff Inhalation Daily   montelukast  10 mg Oral QHS   multivitamin with minerals  1 tablet Oral Daily   pantoprazole  40 mg Oral QAC breakfast   polyethylene glycol  17 g Oral Daily   predniSONE  2.5 mg Oral Q breakfast   rOPINIRole  1 mg Oral BID   rosuvastatin  10 mg Oral QHS   senna  1 tablet  Oral BID   spironolactone  25 mg Oral Daily   torsemide  30 mg Oral QODAY   traZODone  50 mg Oral BID   Continuous:  cefTRIAXone (ROCEPHIN)  IV 2 g (12/29/21 0948)   heparin 1,350 Units/hr (12/29/21 0728)   XAJ:OINOMVEHMCNOB **OR** acetaminophen, albuterol, gabapentin, HYDROmorphone (DILAUDID) injection, ondansetron **OR** ondansetron (ZOFRAN) IV, oxyCODONE, polyvinyl  alcohol  Antibiotics: Anti-infectives (From admission, onward)    Start     Dose/Rate Route Frequency Ordered Stop   12/29/21 1000  cefTRIAXone (ROCEPHIN) 2 g in sodium chloride 0.9 % 100 mL IVPB  Status:  Discontinued        2 g 200 mL/hr over 30 Minutes Intravenous Every 24 hours 12/28/21 1318 12/28/21 1325   12/29/21 1000  azithromycin (ZITHROMAX) tablet 500 mg        500 mg Oral Daily 12/28/21 1318 01/02/22 0959   12/29/21 1000  cefTRIAXone (ROCEPHIN) 2 g in sodium chloride 0.9 % 100 mL IVPB        2 g 200 mL/hr over 30 Minutes Intravenous Every 24 hours 12/28/21 1325 01/02/22 0959   12/28/21 0300  cefTRIAXone (ROCEPHIN) 1 g in sodium chloride 0.9 % 100 mL IVPB        1 g 200 mL/hr over 30 Minutes Intravenous  Once 12/28/21 0251 12/28/21 0309   12/28/21 0245  cefTRIAXone (ROCEPHIN) injection 1 g  Status:  Discontinued        1 g Intramuscular  Once 12/28/21 0230 12/28/21 0250   12/28/21 0245  azithromycin (ZITHROMAX) 500 mg in sodium chloride 0.9 % 250 mL IVPB        500 mg 250 mL/hr over 60 Minutes Intravenous  Once 12/28/21 0230 12/28/21 0454       Objective:  Vital Signs  Vitals:   12/28/21 1432 12/28/21 2015 12/28/21 2025 12/29/21 0035  BP: (!) 121/93  131/69 112/60  Pulse: 64  (!) 59 61  Resp: '18  18 18  '$ Temp: 98 F (36.7 C)  98.8 F (37.1 C) 98.8 F (37.1 C)  TempSrc: Oral  Oral Oral  SpO2: 95% 97% 98% 99%  Weight:      Height:        Intake/Output Summary (Last 24 hours) at 12/29/2021 0959 Last data filed at 12/29/2021 0900 Gross per 24 hour  Intake 1286.05 ml  Output 2550 ml  Net -1263.95 ml   Filed Weights   12/28/21 1316  Weight: 74.1 kg    General appearance: Awake alert.  In no distress Resp: Normal effort at rest.  Few crackles on the right.  No wheezing or rhonchi. Cardio: S1-S2 is normal regular.  No S3-S4.  No rubs murmurs or bruit GI: Abdomen is soft.  Tenderness noted in the left lower abdomen without any rebound rigidity or guarding.  No  masses organomegaly appreciated. Extremities: No edema.  Full range of motion of lower extremities. Neurologic: Alert and oriented x3.  No focal neurological deficits.    Lab Results:  Data Reviewed: I have personally reviewed following labs and reports of the imaging studies  CBC: Recent Labs  Lab 12/27/21 2304 12/29/21 0514  WBC 21.9* 13.9*  HGB 14.7 14.3  HCT 46.4 44.6  MCV 91.2 90.5  PLT 166 096    Basic Metabolic Panel: Recent Labs  Lab 12/27/21 2311 12/29/21 0514  NA 137 141  K 4.4 4.4  CL 104 101  CO2 25 28  GLUCOSE 141* 90  BUN 27* 28*  CREATININE 1.79* 1.72*  CALCIUM 9.5 9.5    GFR: Estimated Creatinine Clearance: 40.7 mL/min (A) (by C-G formula based on SCr of 1.72 mg/dL (H)).  Liver Function Tests: Recent Labs  Lab 12/27/21 2311 12/29/21 0514  AST 20 16  ALT 17 15  ALKPHOS 84 83  BILITOT 1.7* 1.9*  PROT 7.4 7.5  ALBUMIN 4.1 4.1    Recent Labs  Lab 12/28/21 1328  LIPASE 26    Coagulation Profile: Recent Labs  Lab 12/28/21 1154  INR 1.4*     HbA1C: Recent Labs    12/27/21 2304  HGBA1C 6.5*    CBG: Recent Labs  Lab 12/28/21 1624 12/28/21 2026 12/29/21 0735  GLUCAP 140* 135* 102*     Recent Results (from the past 240 hour(s))  Resp Panel by RT-PCR (Flu A&B, Covid) Anterior Nasal Swab     Status: None   Collection Time: 12/27/21 11:11 PM   Specimen: Anterior Nasal Swab  Result Value Ref Range Status   SARS Coronavirus 2 by RT PCR NEGATIVE NEGATIVE Final    Comment: (NOTE) SARS-CoV-2 target nucleic acids are NOT DETECTED.  The SARS-CoV-2 RNA is generally detectable in upper respiratory specimens during the acute phase of infection. The lowest concentration of SARS-CoV-2 viral copies this assay can detect is 138 copies/mL. A negative result does not preclude SARS-Cov-2 infection and should not be used as the sole basis for treatment or other patient management decisions. A negative result may occur with  improper  specimen collection/handling, submission of specimen other than nasopharyngeal swab, presence of viral mutation(s) within the areas targeted by this assay, and inadequate number of viral copies(<138 copies/mL). A negative result must be combined with clinical observations, patient history, and epidemiological information. The expected result is Negative.  Fact Sheet for Patients:  EntrepreneurPulse.com.au  Fact Sheet for Healthcare Providers:  IncredibleEmployment.be  This test is no t yet approved or cleared by the [place] FDA and  has been authorized for detection and/or diagnosis of SARS-CoV-2 by FDA under an Emergency Use Authorization (EUA). This EUA will remain  in effect (meaning this test can be used) for the duration of the COVID-19 declaration under Section 564(b)(1) of the Act, 21 U.S.C.section 360bbb-3(b)(1), unless the authorization is terminated  or revoked sooner.       Influenza A by PCR NEGATIVE NEGATIVE Final   Influenza B by PCR NEGATIVE NEGATIVE Final    Comment: (NOTE) The Xpert Xpress SARS-CoV-2/FLU/RSV plus assay is intended as an aid in the diagnosis of influenza from Nasopharyngeal swab specimens and should not be used as a sole basis for treatment. Nasal washings and aspirates are unacceptable for Xpert Xpress SARS-CoV-2/FLU/RSV testing.  Fact Sheet for Patients: EntrepreneurPulse.com.au  Fact Sheet for Healthcare Providers: IncredibleEmployment.be  This test is not yet approved or cleared by the [place] FDA and has been authorized for detection and/or diagnosis of SARS-CoV-2 by FDA under an Emergency Use Authorization (EUA). This EUA will remain in effect (meaning this test can be used) for the duration of the COVID-19 declaration under Section 564(b)(1) of the Act, 21 U.S.C. section 360bbb-3(b)(1), unless the authorization is terminated or revoked.  Performed at  [facility], [place] [address], [place], [place] [address]       Radiology Studies: CT ABDOMEN PELVIS W CONTRAST  Result Date: 12/28/2021 CLINICAL DATA:  Right-sided abdominal pain with nausea. EXAM: CT ABDOMEN AND PELVIS WITH CONTRAST TECHNIQUE: Multidetector CT imaging of the abdomen and pelvis was performed using the standard protocol following bolus  administration of intravenous contrast. RADIATION DOSE REDUCTION: This exam was performed according to the departmental dose-optimization program which includes automated exposure control, adjustment of the mA and/or kV according to patient size and/or use of iterative reconstruction technique. CONTRAST:  50m OMNIPAQUE IOHEXOL 300 MG/ML  SOLN COMPARISON:  12/04/2021. FINDINGS: Lower chest: The heart is normal in size and pacemaker leads are noted in the right heart. No pericardial effusion. Patchy infiltrates are present in the right lower lobe. A few ground-glass opacities are noted in the medial aspect of the left lower lobe. Hepatobiliary: No focal liver abnormality. A stone is noted in the gallbladder. No biliary ductal dilatation. Pancreas: Unremarkable. No pancreatic ductal dilatation or surrounding inflammatory changes. Spleen: The spleen is enlarged measuring 16.7 cm in length. Adrenals/Urinary Tract: No adrenal nodule or mass. There is atrophy and cortical scarring involving the right kidney. There is cortical scarring in the mid left kidney with associated fat stranding, unchanged from the prior exam. Subcentimeter hypodensities are noted in the kidneys bilaterally which are too small to further characterize. No renal calculus or hydronephrosis. The bladder is unremarkable. Stomach/Bowel: Stomach is within normal limits. Appendix appears normal. No bowel obstruction or surrounding inflammatory changes. There is redemonstration of a short-segment of sigmoid colon in the left lower quadrant with mild wall thickening, not  significantly changed from the prior exam. No free air or pneumatosis. A moderate amount of retained stool is present in the colon compatible with constipation. Vascular/Lymphatic: Aortic atherosclerosis. No enlarged abdominal or pelvic lymph nodes. Reproductive: Prostate is unremarkable. Other: No abdominopelvic ascites. Fat containing inguinal hernias are present bilaterally. There is a fat containing umbilical hernia. Musculoskeletal: Degenerative changes are present in the thoracolumbar spine. No acute or suspicious osseous abnormality. IMPRESSION: 1. Patchy infiltrates in the right lower lobe, new from the previous exam, concerning for pneumonia. 2. Right renal atrophy with renal cortical scarring bilaterally. 3. Short-segment of colonic wall thickening in the left lower quadrant similar in appearance to the prior exam. Colonoscopy is recommended for further evaluation on follow-up. 4. Moderate amount of retained stool in the colon suggesting constipation. 5. Splenomegaly. 6. Aortic atherosclerosis. Electronically Signed   By: LBrett FairyM.D.   On: 12/28/2021 01:30   DG Chest 2 View  Result Date: 12/27/2021 CLINICAL DATA:  Chest pain and nausea. EXAM: CHEST - 2 VIEW COMPARISON:  June 08, 2021 FINDINGS: Multiple sternal wires are noted. A dual lead AICD is seen. The cardiac silhouette is borderline in size. An artificial cardiac valve is seen. An atrial appendage clip is also noted. There is moderate severity calcification of the aortic arch. There is no evidence of an acute infiltrate, pleural effusion or pneumothorax. The visualized skeletal structures are unremarkable. IMPRESSION: No active cardiopulmonary disease. Electronically Signed   By: TVirgina NorfolkM.D.   On: 12/27/2021 23:27       LOS: 1 day   [facility] Pager on [email or web address]  12/29/2021, 9:59 AM

## 2021-12-29 NOTE — Progress Notes (Signed)
ANTICOAGULATION CONSULT NOTE - Follow Up Consult  Pharmacy Consult for Heparin Indication: atrial fibrillation  Allergies  Allergen Reactions   Meloxicam Rash and Other (See Comments)    Red Man Syndrome, also   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 syndrome 09/02/15, resolved with diphenhydramine and slowing of rate    Patient Measurements: Height: '5\' 11"'$  (180.3 cm) Weight: 74.1 kg (163 lb 4.8 oz) IBW/kg (Calculated) : 75.3 Heparin Dosing Weight:    Vital Signs: Temp: 98.8 F (37.1 C) (09/02 0035) Temp Source: Oral (09/02 0035) BP: 112/60 (09/02 0035) Pulse Rate: 61 (09/02 0035)  Labs: Recent Labs    12/27/21 2304 12/27/21 2311 12/28/21 1154 12/28/21 1328 12/28/21 2051 12/29/21 0514  HGB 14.7  --   --   --   --  14.3  HCT 46.4  --   --   --   --  44.6  PLT 166  --   --   --   --  158  APTT  --   --  34  --  61* 61*  LABPROT  --   --  17.3*  --   --   --   INR  --   --  1.4*  --   --   --   HEPARINUNFRC  --   --  0.24*  --   --  0.82*  CREATININE  --  1.79*  --   --   --  1.72*  TROPONINIHS 18*  --   --  18*  --   --     Estimated Creatinine Clearance: 40.7 mL/min (A) (by C-G formula based on SCr of 1.72 mg/dL (H)).   Assessment: AC/Heme: PTA Eliquis-HOLD for colonoscopy. Bridge with IV heparin. - HL 0.82 elevated from Eliquis intxn. aPTT 61 slightly slow.Hgb and Plts WNL  Goal of Therapy:  aPTT 66-102 seconds Monitor platelets by anticoagulation protocol: Yes   Plan:   Increase IV heparin to 1350 units/hr Recheck aPTT in 6 hrs. Daily aPTT, HL, and CBC    Delaynee Alred S. Alford Highland, PharmD, BCPS Clinical Staff Pharmacist [email or web address]  Alford Highland, [place] 12/29/2021,7:19 AM

## 2021-12-30 ENCOUNTER — Inpatient Hospital Stay (HOSPITAL_COMMUNITY): Payer: Medicare Other | Admitting: Certified Registered Nurse Anesthetist

## 2021-12-30 ENCOUNTER — Encounter (HOSPITAL_COMMUNITY): Payer: Self-pay | Admitting: Internal Medicine

## 2021-12-30 ENCOUNTER — Encounter (HOSPITAL_COMMUNITY)
Admission: EM | Disposition: A | Discharge status: Home / Self Care | Payer: Self-pay | Source: Home / Self Care | Attending: Internal Medicine

## 2021-12-30 DIAGNOSIS — J189 Pneumonia, unspecified organism: Secondary | ICD-10-CM | POA: Diagnosis not present

## 2021-12-30 DIAGNOSIS — K6389 Other specified diseases of intestine: Secondary | ICD-10-CM

## 2021-12-30 DIAGNOSIS — Z87891 Personal history of nicotine dependence: Secondary | ICD-10-CM

## 2021-12-30 DIAGNOSIS — N1832 Chronic kidney disease, stage 3b: Secondary | ICD-10-CM | POA: Diagnosis not present

## 2021-12-30 DIAGNOSIS — R1032 Left lower quadrant pain: Secondary | ICD-10-CM

## 2021-12-30 DIAGNOSIS — I2510 Atherosclerotic heart disease of native coronary artery without angina pectoris: Secondary | ICD-10-CM

## 2021-12-30 DIAGNOSIS — I480 Paroxysmal atrial fibrillation: Secondary | ICD-10-CM | POA: Diagnosis not present

## 2021-12-30 DIAGNOSIS — I509 Heart failure, unspecified: Secondary | ICD-10-CM

## 2021-12-30 DIAGNOSIS — I110 Hypertensive heart disease with heart failure: Secondary | ICD-10-CM

## 2021-12-30 HISTORY — PX: FLEXIBLE SIGMOIDOSCOPY: SHX5431

## 2021-12-30 LAB — CBC
HCT: 40.6 % (ref 39.0–52.0)
Hemoglobin: 12.9 g/dL — ABNORMAL LOW (ref 13.0–17.0)
MCH: 28.7 pg (ref 26.0–34.0)
MCHC: 31.8 g/dL (ref 30.0–36.0)
MCV: 90.4 fL (ref 80.0–100.0)
Platelets: 160 10*3/uL (ref 150–400)
RBC: 4.49 MIL/uL (ref 4.22–5.81)
RDW: 17.3 % — ABNORMAL HIGH (ref 11.5–15.5)
WBC: 7.9 10*3/uL (ref 4.0–10.5)
nRBC: 0 % (ref 0.0–0.2)

## 2021-12-30 LAB — HEPARIN LEVEL (UNFRACTIONATED): Heparin Unfractionated: 0.87 IU/mL — ABNORMAL HIGH (ref 0.30–0.70)

## 2021-12-30 LAB — APTT: aPTT: 87 seconds — ABNORMAL HIGH (ref 24–36)

## 2021-12-30 LAB — GLUCOSE, CAPILLARY
Glucose-Capillary: 95 mg/dL (ref 70–99)
Glucose-Capillary: 92 mg/dL (ref 70–99)
Glucose-Capillary: 96 mg/dL (ref 70–99)
Glucose-Capillary: 119 mg/dL — ABNORMAL HIGH (ref 70–99)

## 2021-12-30 SURGERY — SIGMOIDOSCOPY, FLEXIBLE
Anesthesia: Monitor Anesthesia Care

## 2021-12-30 MED ORDER — HEPARIN (PORCINE) 25000 UT/250ML-% IV SOLN
1350.0000 [IU]/h | INTRAVENOUS | Status: DC
Start: 2021-12-30 — End: 2021-12-30
  Administered 2021-12-30: 1350 [IU]/h via INTRAVENOUS

## 2021-12-30 MED ORDER — PROPOFOL 500 MG/50ML IV EMUL
INTRAVENOUS | Status: DC | PRN
  Administered 2021-12-30: 80 ug/kg/min via INTRAVENOUS

## 2021-12-30 MED ORDER — PROPOFOL 500 MG/50ML IV EMUL
INTRAVENOUS | Status: AC
  Filled 2021-12-30: qty 50

## 2021-12-30 MED ORDER — ONDANSETRON HCL 4 MG/2ML IJ SOLN
INTRAMUSCULAR | Status: DC | PRN
  Administered 2021-12-30: 4 mg via INTRAVENOUS

## 2021-12-30 MED ORDER — PROPOFOL 10 MG/ML IV BOLUS
INTRAVENOUS | Status: AC
  Filled 2021-12-30: qty 20

## 2021-12-30 MED ORDER — POLYETHYLENE GLYCOL 3350 17 GM/SCOOP PO POWD
1.0000 | Freq: Once | ORAL | Status: AC
  Administered 2021-12-30: 255 g via ORAL
  Filled 2021-12-30: qty 255

## 2021-12-30 MED ORDER — PROPOFOL 10 MG/ML IV BOLUS
INTRAVENOUS | Status: DC | PRN
  Administered 2021-12-30: 20 mg via INTRAVENOUS
  Administered 2021-12-30 (×3): 10 mg via INTRAVENOUS

## 2021-12-30 MED ORDER — HEPARIN (PORCINE) 25000 UT/250ML-% IV SOLN: 1350.0000 [IU]/h | INTRAVENOUS | Status: DC

## 2021-12-30 MED ORDER — LACTATED RINGERS IV SOLN: INTRAVENOUS | Status: DC

## 2021-12-30 MED ORDER — LACTATED RINGERS IV SOLN: INTRAVENOUS | Status: DC | PRN

## 2021-12-30 MED ORDER — HEPARIN (PORCINE) 25000 UT/250ML-% IV SOLN
1350.0000 [IU]/h | INTRAVENOUS | Status: DC
  Administered 2021-12-31 – 2022-01-01 (×2): 1350 [IU]/h via INTRAVENOUS
  Filled 2021-12-30 (×2): qty 250

## 2021-12-30 MED ORDER — LIDOCAINE HCL (CARDIAC) PF 100 MG/5ML IV SOSY
PREFILLED_SYRINGE | INTRAVENOUS | Status: DC | PRN
  Administered 2021-12-30: 100 mg via INTRAVENOUS

## 2021-12-30 NOTE — Transfer of Care (Signed)
Immediate Anesthesia Transfer of Care Note  Patient: [name]  Procedure(s) Performed: FLEXIBLE SIGMOIDOSCOPY  Patient Location: PACU  Anesthesia Type:MAC  Level of Consciousness: awake, alert , oriented and patient cooperative  Airway & Oxygen Therapy: Patient Spontanous Breathing  Post-op Assessment: Report given to RN and Post -op Vital signs reviewed and stable  Post vital signs: Reviewed and stable  Last Vitals:  Vitals Value Taken Time  BP 119/68 1130  Temp    Pulse 60 1130  Resp 20 1130  SpO2 100% 1130    Last Pain:  Vitals:   12/30/21 1101  TempSrc: Temporal  PainSc: 0-No pain      Patients Stated Pain Goal: 1 (44/92/01 0071)  Complications: No notable events documented.

## 2021-12-30 NOTE — Interval H&P Note (Signed)
History and Physical Interval Note:  12/30/2021 11:07 AM  [name]  has presented today for surgery, with the diagnosis of sigmoid thickening.  The various methods of treatment have been discussed with the patient and family. After consideration of risks, benefits and other options for treatment, the patient has consented to  Procedure(s): COLONOSCOPY (N/A) as a surgical intervention.  The patient's history has been reviewed, patient examined, no change in status, stable for surgery.  I have reviewed the patient's chart and labs.  Questions were answered to the patient's satisfaction.     [name]

## 2021-12-30 NOTE — Progress Notes (Signed)
[facility] PROGRESS NOTE   [name] JJK:[id number] DOB: November 01, 1950 DOA: 12/27/2021  PCP: Velna Hatchet, MD  Brief History/Interval Summary: 71 y.o. male with medical history significant for a fib on eliquis, CKD 3b, chronic diastolic HF, DM, COPD on 2L qHS, HTN, HLD, hypothyroidism, CHB s/p PPM. Presenting with right chest pain and LLQ abdominal pain.  Found to have right-sided pneumonia.  CT scan raised concern for colonic wall thickening.  Patient was hospitalized for further management.    Consultants: [facility] gastroenterology.  Cardiology.  Procedures: Colonoscopy versus sigmoidoscopy planned for today    Subjective/Interval History: Patient mentions that his shortness of breath is improved.  Abdominal pain is also getting better.  Denies any nausea or vomiting.  Does have a cough with clear expectoration.      Assessment/Plan:  Community-acquired pneumonia/chronic respiratory failure with hypoxia/pleuritic chest pain Chest pain most likely secondary to pneumonia noted on CT scan.  Venous thromboembolism seems less likely since the patient is already on apixaban. Continue with antibiotics.  Patient appears to be improving.  Saturations are normal on his usual home oxygen regimen.  WBC is normal. Mucinex.  Flutter valve.  Left lower quadrant abdominal pain/colonic wall thickening Apparently this has been ongoing for some time.  Plan was for outpatient colonoscopy in the next few months.  However patient does report weight loss of about 40 pounds in the last few months.  Since he is symptomatic gastroenterology was consulted.  Seen by cardiology and cleared for endoscopy.  Plan is for either colonoscopy or flexible sigmoidoscopy today.  Chronic diastolic CHF/history of aortic valve stenosis status post replacement/coronary artery disease Followed by cardiology.  Last echocardiogram from February showed normal systolic function.  Seems to be fairly euvolemic at this  time.  Paroxysmal atrial fibrillation Not noted to be on any rate limiting medications.  Was on apixaban prior to admission which is currently on hold for endoscopy. Remains on IV heparin.  Stopped this morning for procedure.  Constipation Started on bowel regimen.  Chronic kidney disease stage IIIb Seems to be close to baseline.  Monitor urine output.  Avoid nephrotoxic agents.  History of COPD on chronic steroids Stable.  Continue prednisone.  Hypothyroidism Continue home medication regimen.  Diabetes mellitus type 2 HbA1c 6.5.  Continue SSI.  Noted to be on Jardiance prior to admission.  DVT Prophylaxis: On IV heparin  Code Status: Full code Family Communication: Discussed with patient and his wife Disposition Plan: Hopefully return home when improved.  Start mobilizing.  Status is: Inpatient Remains inpatient appropriate because: Pneumonia, abdominal pain, weight loss      Medications: Scheduled:  amLODipine  2.5 mg Oral Daily   azithromycin  500 mg Oral Daily   gabapentin  600 mg Oral Daily   And   gabapentin  1,200 mg Oral QHS   guaiFENesin  600 mg Oral BID   insulin aspart  0-15 Units Subcutaneous TID WC   insulin aspart  0-5 Units Subcutaneous QHS   levothyroxine  175 mcg Oral QAC breakfast   mometasone-formoterol  2 puff Inhalation BID   And   umeclidinium bromide  1 puff Inhalation Daily   montelukast  10 mg Oral QHS   multivitamin with minerals  1 tablet Oral Daily   pantoprazole  40 mg Oral QAC breakfast   polyethylene glycol  17 g Oral Daily   predniSONE  2.5 mg Oral Q breakfast   rOPINIRole  1 mg Oral BID   rosuvastatin  10  mg Oral QHS   senna  1 tablet Oral BID   spironolactone  25 mg Oral Daily   torsemide  30 mg Oral QODAY   traZODone  50 mg Oral BID   Continuous:  cefTRIAXone (ROCEPHIN)  IV Stopped (12/29/21 1018)   CXK:GYJEHUDJSHFWY **OR** acetaminophen, albuterol, gabapentin, HYDROmorphone (DILAUDID) injection, ondansetron **OR**  ondansetron (ZOFRAN) IV, oxyCODONE, polyvinyl alcohol  Antibiotics: Anti-infectives (From admission, onward)    Start     Dose/Rate Route Frequency Ordered Stop   12/29/21 1000  cefTRIAXone (ROCEPHIN) 2 g in sodium chloride 0.9 % 100 mL IVPB  Status:  Discontinued        2 g 200 mL/hr over 30 Minutes Intravenous Every 24 hours 12/28/21 1318 12/28/21 1325   12/29/21 1000  azithromycin (ZITHROMAX) tablet 500 mg        500 mg Oral Daily 12/28/21 1318 01/02/22 0959   12/29/21 1000  cefTRIAXone (ROCEPHIN) 2 g in sodium chloride 0.9 % 100 mL IVPB        2 g 200 mL/hr over 30 Minutes Intravenous Every 24 hours 12/28/21 1325 01/02/22 0959   12/28/21 0300  cefTRIAXone (ROCEPHIN) 1 g in sodium chloride 0.9 % 100 mL IVPB        1 g 200 mL/hr over 30 Minutes Intravenous  Once 12/28/21 0251 12/28/21 0309   12/28/21 0245  cefTRIAXone (ROCEPHIN) injection 1 g  Status:  Discontinued        1 g Intramuscular  Once 12/28/21 0230 12/28/21 0250   12/28/21 0245  azithromycin (ZITHROMAX) 500 mg in sodium chloride 0.9 % 250 mL IVPB        500 mg 250 mL/hr over 60 Minutes Intravenous  Once 12/28/21 0230 12/28/21 0454       Objective:  Vital Signs  Vitals:   12/29/21 1957 12/29/21 2037 12/30/21 0425 12/30/21 0844  BP: (!) 126/59  123/78   Pulse: 61 67 (!) 59   Resp: '18 18 18   '$ Temp: 98.5 F (36.9 C)  97.6 F (36.4 C)   TempSrc: Oral  Oral   SpO2: 100% 97% 97% 98%  Weight:      Height:        Intake/Output Summary (Last 24 hours) at 12/30/2021 1022 Last data filed at 12/30/2021 0700 Gross per 24 hour  Intake 900 ml  Output 1150 ml  Net -250 ml    Filed Weights   12/28/21 1316  Weight: 74.1 kg    General appearance: Awake alert.  In no distress Resp: Improved air entry bilaterally.  Few crackles at the bases.  No wheezing or rhonchi. Cardio: S1-S2 is normal regular.  No S3-S4.  No rubs murmurs or bruit GI: Abdomen is soft.  Mild tenderness in the left lower abdomen today.  Less so  compared to yesterday.  No rebound rigidity or guarding.  No masses organomegaly. Extremities: No edema.  Full range of motion of lower extremities. Neurologic: Alert and oriented x3.  No focal neurological deficits.     Lab Results:  Data Reviewed: I have personally reviewed following labs and reports of the imaging studies  CBC: Recent Labs  Lab 12/27/21 2304 12/29/21 0514 12/30/21 0540  WBC 21.9* 13.9* 7.9  HGB 14.7 14.3 12.9*  HCT 46.4 44.6 40.6  MCV 91.2 90.5 90.4  PLT 166 158 160     Basic Metabolic Panel: Recent Labs  Lab 12/27/21 2311 12/29/21 0514  NA 137 141  K 4.4 4.4  CL 104 101  CO2  25 28  GLUCOSE 141* 90  BUN 27* 28*  CREATININE 1.79* 1.72*  CALCIUM 9.5 9.5     GFR: Estimated Creatinine Clearance: 40.7 mL/min (A) (by C-G formula based on SCr of 1.72 mg/dL (H)).  Liver Function Tests: Recent Labs  Lab 12/27/21 2311 12/29/21 0514  AST 20 16  ALT 17 15  ALKPHOS 84 83  BILITOT 1.7* 1.9*  PROT 7.4 7.5  ALBUMIN 4.1 4.1     Recent Labs  Lab 12/28/21 1328  LIPASE 26     Coagulation Profile: Recent Labs  Lab 12/28/21 1154  INR 1.4*      HbA1C: Recent Labs    12/27/21 2304  HGBA1C 6.5*     CBG: Recent Labs  Lab 12/29/21 0735 12/29/21 1128 12/29/21 1633 12/29/21 1958 12/30/21 0755  GLUCAP 102* 158* 76 104* 95      Recent Results (from the past 240 hour(s))  Resp Panel by RT-PCR (Flu A&B, Covid) Anterior Nasal Swab     Status: None   Collection Time: 12/27/21 11:11 PM   Specimen: Anterior Nasal Swab  Result Value Ref Range Status   SARS Coronavirus 2 by RT PCR NEGATIVE NEGATIVE Final    Comment: (NOTE) SARS-CoV-2 target nucleic acids are NOT DETECTED.  The SARS-CoV-2 RNA is generally detectable in upper respiratory specimens during the acute phase of infection. The lowest concentration of SARS-CoV-2 viral copies this assay can detect is 138 copies/mL. A negative result does not preclude SARS-Cov-2 infection  and should not be used as the sole basis for treatment or other patient management decisions. A negative result may occur with  improper specimen collection/handling, submission of specimen other than nasopharyngeal swab, presence of viral mutation(s) within the areas targeted by this assay, and inadequate number of viral copies(<138 copies/mL). A negative result must be combined with clinical observations, patient history, and epidemiological information. The expected result is Negative.  Fact Sheet for Patients:  EntrepreneurPulse.com.au  Fact Sheet for Healthcare Providers:  IncredibleEmployment.be  This test is no t yet approved or cleared by the [place] FDA and  has been authorized for detection and/or diagnosis of SARS-CoV-2 by FDA under an Emergency Use Authorization (EUA). This EUA will remain  in effect (meaning this test can be used) for the duration of the COVID-19 declaration under Section 564(b)(1) of the Act, 21 U.S.C.section 360bbb-3(b)(1), unless the authorization is terminated  or revoked sooner.       Influenza A by PCR NEGATIVE NEGATIVE Final   Influenza B by PCR NEGATIVE NEGATIVE Final    Comment: (NOTE) The Xpert Xpress SARS-CoV-2/FLU/RSV plus assay is intended as an aid in the diagnosis of influenza from Nasopharyngeal swab specimens and should not be used as a sole basis for treatment. Nasal washings and aspirates are unacceptable for Xpert Xpress SARS-CoV-2/FLU/RSV testing.  Fact Sheet for Patients: EntrepreneurPulse.com.au  Fact Sheet for Healthcare Providers: IncredibleEmployment.be  This test is not yet approved or cleared by the [place] FDA and has been authorized for detection and/or diagnosis of SARS-CoV-2 by FDA under an Emergency Use Authorization (EUA). This EUA will remain in effect (meaning this test can be used) for the duration of the COVID-19 declaration  under Section 564(b)(1) of the Act, 21 U.S.C. section 360bbb-3(b)(1), unless the authorization is terminated or revoked.  Performed at [facility], [place] [address] [place], [place] [address]       Radiology Studies: No results found.     LOS: 2 days   Bonnielee Haff  [facility] operational officer on [email or web address]  12/30/2021, 10:22 AM

## 2021-12-30 NOTE — Anesthesia Preprocedure Evaluation (Addendum)
Anesthesia Evaluation  Patient identified by MRN, date of birth, ID band Patient awake    Reviewed: Allergy & Precautions, NPO status , Patient's Chart, lab work & pertinent test results  Airway Mallampati: II  TM Distance: >3 FB Neck ROM: Full    Dental  (+) Edentulous Upper, Missing, Poor Dentition, Partial Lower, Dental Advisory Given   Pulmonary shortness of breath, asthma , sleep apnea , pneumonia, COPD, former smoker,    Pulmonary exam normal breath sounds clear to auscultation       Cardiovascular hypertension, Pt. on medications + CAD and +CHF  + dysrhythmias + pacemaker + Valvular Problems/Murmurs AS  Rhythm:Regular Rate:Normal + Systolic murmurs Echo 07/7827 1. Left ventricular ejection fraction, by estimation, is 55 to 60%. The left ventricle has normal function. The left ventricle has no regional wall motion abnormalities. Left ventricular diastolic parameters are indeterminate.  2. Right ventricular systolic function is normal. The right ventricular size is normal. The estimated right ventricular systolic pressure is 56.2 mmHg.  3. The mitral valve is normal in structure. Trivial mitral valve regurgitation. No evidence of mitral stenosis.  4. The aortic valve has been repaired/replaced. Aortic valve regurgitation is not visualized. No aortic stenosis is present. There is a bioprosthetic valve present in the aortic position. Aortic valve area, by VTI measures 1.63 cm. Aortic valve Vmax measures 2.28 m/s. Aortic valve mean gradient measures 11.0 mmHg. Aortic valve peak gradient measures 20.8 mmHg.  5. There is mild dilatation of the ascending aorta, measuring 38 mm.  6. The inferior vena cava is normal in size with greater than 50% respiratory variability, suggesting right atrial pressure of 3 mmHg.  7. Compared to echo 12/13/2019, the mean AVG has increased from 8 to 86mhg. DVi has dcreased from 0.65 to 0.57 and AVA has  decreased from 2.04cm2 to 1.63cm2. The AVA calculation is likely underestimated due to small LVOT measurement.  8. Left atrial size was mildly dilated.    Neuro/Psych  Neuromuscular disease    GI/Hepatic GERD  ,  Endo/Other  diabetesHypothyroidism   Renal/GU Renal disease     Musculoskeletal  (+) Arthritis , Fibromyalgia -  Abdominal   Peds  Hematology   Anesthesia Other Findings   Reproductive/Obstetrics                           Anesthesia Physical  Anesthesia Plan  ASA: 3  Anesthesia Plan: MAC   Post-op Pain Management: Minimal or no pain anticipated   Induction: Intravenous  PONV Risk Score and Plan: 2 and Treatment may vary due to age or medical condition, Ondansetron, Propofol infusion and TIVA  Airway Management Planned:   Additional Equipment: None  Intra-op Plan:   Post-operative Plan:   Informed Consent: I have reviewed the patients History and Physical, chart, labs and discussed the procedure including the risks, benefits and alternatives for the proposed anesthesia with the patient or authorized representative who has indicated his/her understanding and acceptance.   Patient has DNR.  Discussed DNR with patient and Suspend DNR.   Dental advisory given  Plan Discussed with: CRNA  Anesthesia Plan Comments: (See PAT note 02/02/2021, JKonrad FelixWard, PA-C)    Anesthesia Quick Evaluation

## 2021-12-30 NOTE — Progress Notes (Signed)
Pt has left with staff to go to Endo.

## 2021-12-30 NOTE — Progress Notes (Signed)
Pt has returned from PACU. Alert and oriented. Spouse has now joined husband in room. VS WNL

## 2021-12-30 NOTE — Progress Notes (Addendum)
ANTICOAGULATION CONSULT NOTE - Follow Up Consult  Pharmacy Consult for Heparin Indication: atrial fibrillation  Allergies  Allergen Reactions   Meloxicam Rash and Other (See Comments)    Red Man Syndrome, also   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 syndrome 09/02/15, resolved with diphenhydramine and slowing of rate    Patient Measurements: Height: '5\' 11"'$  (180.3 cm) Weight: 74.1 kg (163 lb 4.8 oz) IBW/kg (Calculated) : 75.3 Heparin Dosing Weight:  74.1 kg  Vital Signs: Temp: 97.6 F (36.4 C) (09/03 0425) Temp Source: Oral (09/03 0425) BP: 123/78 (09/03 0425) Pulse Rate: 59 (09/03 0425)  Labs: Recent Labs    12/27/21 2304 12/27/21 2311 12/28/21 1154 12/28/21 1328 12/28/21 2051 12/29/21 0514 12/29/21 1339 12/30/21 0540  HGB 14.7  --   --   --   --  14.3  --  12.9*  HCT 46.4  --   --   --   --  44.6  --  40.6  PLT 166  --   --   --   --  158  --  160  APTT  --   --  34  --    < > 61* 70* 87*  LABPROT  --   --  17.3*  --   --   --   --   --   INR  --   --  1.4*  --   --   --   --   --   HEPARINUNFRC  --   --  0.24*  --   --  0.82*  --  0.87*  CREATININE  --  1.79*  --   --   --  1.72*  --   --   TROPONINIHS 18*  --   --  18*  --   --   --   --    < > = values in this interval not displayed.    Estimated Creatinine Clearance: 40.7 mL/min (A) (by C-G formula based on SCr of 1.72 mg/dL (H)).  Assessment: AC/Heme: PTA Eliquis for afib-HOLD for colonoscopy. Bridge with IV heparin. - HL 0.87 elevated from Eliquis intxn. aPTT 87 in goal, Hgb 14.3>12.9, Plts stable.  Goal of Therapy:  aPTT 66-102 seconds Monitor platelets by anticoagulation protocol: Yes   Plan:  0600 Heparin off for GI  Ambree Frances S. Alford Highland, PharmD, BCPS Clinical Staff Pharmacist [email or web address]  Antinette Keough Stillinger 12/30/2021,10:37 AM  1300: (Poor prep and unable to adequately visualize sigmoid colon). Re-try mid-week. Resume IV heparin 1350 units/hr Daily aPTT, HL, and CBC

## 2021-12-30 NOTE — Progress Notes (Signed)
Pt had 2 BMs this AM, but was still unable to drink much of the second bowel prep jug despite encouragement/education  throughout the shift.  Heparin gtt stopped at 0600 this AM in preparation for procedure.

## 2021-12-30 NOTE — Brief Op Note (Signed)
Poor prep and unable to adequately visualize sigmoid colon so procedure was stopped. Will give Miralax prep today and see if he is more compliant with that and will repeat tomorrow with plans to do a colonoscopy mid-week if he is willing to drink additional prep. Due to need for anticoagulation ideally needs to have repeat colonoscopy done as an inpt. Clear liquid diet. Resume IV Heparin.

## 2021-12-30 NOTE — Op Note (Signed)
[facility] Patient Name: [name] Procedure Date: 12/30/2021 MRN: [id number] Attending MD: [name] , MD Date of Birth: 1950/07/27 CSN: [id number] Age: 71 Admit Type: Inpatient Procedure:                Colonoscopy Indications:              Abdominal pain in the left lower quadrant, Abnormal                            CT of the GI tract Providers:                [name], MD, Jaci Carrel, RN,                            Gloris Ham, Technician Referring MD:             hospital team Medicines:                Propofol per Anesthesia, Monitored Anesthesia Care Complications:            No immediate complications. Estimated Blood Loss:     Estimated blood loss: none. Procedure:                Pre-Anesthesia Assessment:                           - Prior to the procedure, a History and Physical                            was performed, and patient medications and                            allergies were reviewed. The patient's tolerance of                            previous anesthesia was also reviewed. The risks                            and benefits of the procedure and the sedation                            options and risks were discussed with the patient.                            All questions were answered, and informed consent                            was obtained. Prior Anticoagulants: The patient has                            taken Eliquis (apixaban), last dose was stopped at                            admission. ASA Grade Assessment: III - A patient  with severe systemic disease. After reviewing the                            risks and benefits, the patient was deemed in                            satisfactory condition to undergo the procedure.                           After obtaining informed consent, the colonoscope                            was passed under direct vision. Throughout the                             procedure, the patient's blood pressure, pulse, and                            oxygen saturations were monitored continuously. The                            [id number]-HQ190L ([id number]) Olympus colonoscope was                            introduced through the anus with the intention of                            advancing to the cecum. The scope was advanced to                            the splenic flexure before the procedure was                            aborted. Medications were given. The colonoscopy                            was performed with difficulty due to poor bowel                            prep with stool present and unsatisfactory bowel                            prep. The patient tolerated the procedure well. The                            quality of the bowel preparation was poor and not                            adequate to identify polyps 6 mm and larger in                            size. The rectum was photographed. Scope In: Scope Out: Findings:      The perianal and  digital rectal examinations were normal.      Copious quantities of semi-solid solid stool was found in the entire       colon, precluding visualization.      No obvious mass seen in sigmoid colon but needs better visualization       after additional prep.      Internal hemorrhoids were found during retroflexion. The hemorrhoids       were small and Grade I (internal hemorrhoids that do not prolapse).      Due to poor prep, colonoscopy not advanced proximal to the splenic       flexure. Impression:               - Preparation of the colon was poor.                           - Preparation of the colon was inadequate.                           - Stool in the entire examined colon.                           - Internal hemorrhoids.                           - No specimens collected. Moderate Sedation:      N/A - MAC procedure Recommendation:           - Clear liquid diet.                            - Miralax prep today and will need additional                            Nulytely tomorrow to better visualize colon on a                            colonoscopy this week. Procedure Code(s):        --- Professional ---                           [phone], 53, Colonoscopy, flexible; diagnostic,                            including collection of specimen(s) by brushing or                            washing, when performed (separate procedure) Diagnosis Code(s):        --- Professional ---                           R10.32, Left lower quadrant pain                           R93.3, Abnormal findings on diagnostic imaging of                            other parts of digestive tract  K64.0, First degree hemorrhoids CPT copyright 2019 American Medical Association. All rights reserved. The codes documented in this report are preliminary and upon coder review may  be revised to meet current compliance requirements. [name], MD 12/30/2021 11:33:43 AM This report has been signed electronically. Number of Addenda: 0

## 2021-12-30 NOTE — Anesthesia Postprocedure Evaluation (Signed)
Anesthesia Post Note  Patient: [name]  Procedure(s) Performed: FLEXIBLE SIGMOIDOSCOPY     Anesthesia Type: MAC Anesthetic complications: no   No notable events documented.  Last Vitals:  Vitals:   12/30/21 1147 12/30/21 1159  BP: 127/70 131/70  Pulse: 65 67  Resp: 15 16  Temp: (!) 36.3 C 36.4 C  SpO2: 98% 96%    Last Pain:  Vitals:   12/30/21 1251  TempSrc:   PainSc: [place]

## 2021-12-30 NOTE — Progress Notes (Signed)
Pt has finished one half of the Miralax "prep". Continues to drink other half.

## 2021-12-31 ENCOUNTER — Encounter (HOSPITAL_COMMUNITY): Payer: Self-pay | Admitting: Gastroenterology

## 2021-12-31 DIAGNOSIS — J189 Pneumonia, unspecified organism: Secondary | ICD-10-CM | POA: Diagnosis not present

## 2021-12-31 DIAGNOSIS — R1032 Left lower quadrant pain: Secondary | ICD-10-CM | POA: Diagnosis not present

## 2021-12-31 DIAGNOSIS — I480 Paroxysmal atrial fibrillation: Secondary | ICD-10-CM | POA: Diagnosis not present

## 2021-12-31 DIAGNOSIS — N1832 Chronic kidney disease, stage 3b: Secondary | ICD-10-CM | POA: Diagnosis not present

## 2021-12-31 LAB — GLUCOSE, CAPILLARY
Glucose-Capillary: 110 mg/dL — ABNORMAL HIGH (ref 70–99)
Glucose-Capillary: 137 mg/dL — ABNORMAL HIGH (ref 70–99)
Glucose-Capillary: 95 mg/dL (ref 70–99)
Glucose-Capillary: 133 mg/dL — ABNORMAL HIGH (ref 70–99)

## 2021-12-31 LAB — BASIC METABOLIC PANEL
Anion gap: 10 (ref 5–15)
BUN: 23 mg/dL (ref 8–23)
CO2: 29 mmol/L (ref 22–32)
Calcium: 9.6 mg/dL (ref 8.9–10.3)
Chloride: 98 mmol/L (ref 98–111)
Creatinine, Ser: 1.73 mg/dL — ABNORMAL HIGH (ref 0.61–1.24)
GFR, Estimated: 41 mL/min — ABNORMAL LOW (ref >60)
Glucose, Bld: 109 mg/dL — ABNORMAL HIGH (ref 70–99)
Potassium: 4.2 mmol/L (ref 3.5–5.1)
Sodium: 137 mmol/L (ref 135–145)

## 2021-12-31 LAB — CBC
HCT: 42.4 % (ref 39.0–52.0)
Hemoglobin: 13.6 g/dL (ref 13.0–17.0)
MCH: 28.7 pg (ref 26.0–34.0)
MCHC: 32.1 g/dL (ref 30.0–36.0)
MCV: 89.5 fL (ref 80.0–100.0)
Platelets: 183 10*3/uL (ref 150–400)
RBC: 4.74 MIL/uL (ref 4.22–5.81)
RDW: 17.1 % — ABNORMAL HIGH (ref 11.5–15.5)
WBC: 8.2 10*3/uL (ref 4.0–10.5)
nRBC: 0 % (ref 0.0–0.2)

## 2021-12-31 LAB — APTT: aPTT: 82 seconds — ABNORMAL HIGH (ref 24–36)

## 2021-12-31 LAB — HEPARIN LEVEL (UNFRACTIONATED): Heparin Unfractionated: 0.44 IU/mL (ref 0.30–0.70)

## 2021-12-31 MED ORDER — SPIRONOLACTONE 25 MG PO TABS
25.0000 mg | ORAL_TABLET | Freq: Every day | ORAL | Status: DC
  Administered 2022-01-02: 25 mg via ORAL
  Filled 2021-12-31: qty 1

## 2021-12-31 MED ORDER — PEG 3350-KCL-NA BICARB-NACL 420 G PO SOLR
4000.0000 mL | Freq: Once | ORAL | Status: AC
Start: 2021-12-31 — End: 2021-12-31
  Administered 2021-12-31: 4000 mL via ORAL

## 2021-12-31 MED ORDER — TORSEMIDE 20 MG PO TABS
30.0000 mg | ORAL_TABLET | ORAL | Status: DC
  Administered 2022-01-02: 30 mg via ORAL
  Filled 2021-12-31: qty 1

## 2021-12-31 NOTE — Progress Notes (Signed)
ANTICOAGULATION CONSULT NOTE - Follow Up Consult  Pharmacy Consult for Heparin Indication: atrial fibrillation  Allergies  Allergen Reactions   Meloxicam Rash and Other (See Comments)    Red Man Syndrome, also   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 syndrome 09/02/15, resolved with diphenhydramine and slowing of rate    Patient Measurements: Height: '5\' 11"'$  (180.3 cm) Weight: 74.1 kg (163 lb 5.8 oz) IBW/kg (Calculated) : 75.3 Heparin Dosing Weight:  74.1 kg  Vital Signs: Temp: 98.5 F (36.9 C) (09/04 0434) BP: 118/68 (09/04 0434) Pulse Rate: 66 (09/04 0434)  Labs: Recent Labs    12/28/21 1154 12/28/21 1328 12/28/21 2051 12/29/21 0514 12/29/21 1339 12/30/21 0540 12/31/21 0432  HGB  --   --    < > 14.3  --  12.9* 13.6  HCT  --   --   --  44.6  --  40.6 42.4  PLT  --   --   --  158  --  160 183  APTT 34  --    < > 61* 70* 87* 82*  LABPROT 17.3*  --   --   --   --   --   --   INR 1.4*  --   --   --   --   --   --   HEPARINUNFRC 0.24*  --   --  0.82*  --  0.87* 0.44  CREATININE  --   --   --  1.72*  --   --  1.73*  TROPONINIHS  --  18*  --   --   --   --   --    < > = values in this interval not displayed.     Estimated Creatinine Clearance: 40.5 mL/min (A) (by C-G formula based on SCr of 1.73 mg/dL (H)).  Assessment: 71 year old male presented with chest and abdominal pain. Plan for patient to have colonoscopy for which Eliquis will need to be held. Pharmacy consulted for management of heparin bridge therapy.  Significant Events: 9/3: Poor prep for colonoscopy and unable to adequately visualize sigmoid colon). Re-try mid-week. - Resume IV heparin 1350 units/hr  Today, 12/31/2021 aPTT 82 sec, H 0.44 (correlating) on heparin 1350 units/hr CBC: Hgb 13.6 (stable), Plts WNL SCr remains elevated 1.73 No bleeding or infusion related issues per d/w RN  Goal of Therapy:  aPTT 66-102 seconds Monitor platelets by anticoagulation protocol: Yes   Plan:  Continue  heparin drip at 1350 units/hr Daily aPTT, CBC Follow up plans for repeat colonoscopy and need for holding heparin prior   Peggyann Juba, PharmD, BCPS Pharmacy: [phone] 12/31/2021,6:59 AM

## 2021-12-31 NOTE — Care Management Important Message (Signed)
Important Message  Patient Details IM Letter given to the Patient. Name: [name] MRN: [id number] Date of Birth: 1950-10-20   Medicare Important Message Given:  Yes     Kerin Salen 12/31/2021, 9:24 AM

## 2021-12-31 NOTE — Progress Notes (Signed)
[facility] PROGRESS NOTE   [name] EXB:[id number] DOB: 10/21/1950 DOA: 12/27/2021  PCP: Velna Hatchet, MD  Brief History/Interval Summary: 71 y.o. male with medical history significant for a fib on eliquis, CKD 3b, chronic diastolic HF, DM, COPD on 2L qHS, HTN, HLD, hypothyroidism, CHB s/p PPM. Presenting with right chest pain and LLQ abdominal pain.  Found to have right-sided pneumonia.  CT scan raised concern for colonic wall thickening.  Patient was hospitalized for further management.    Consultants: [facility] [facility].  Cardiology.  Procedures: Colonoscopy colonoscopy is planned for tomorrow    Subjective/Interval History: Patient mentions that has bowel movements are watery but still has stool in it.  No nausea vomiting.  Shortness of breath is improved and feels like he is almost back to his baseline.      Assessment/Plan:  Community-acquired pneumonia/chronic respiratory failure with hypoxia/pleuritic chest pain Chest pain most likely secondary to pneumonia noted on CT scan.  Venous thromboembolism seems less likely since the patient was already on apixaban. Patient improving with antibiotics.  Saturations are normal on his usual home oxygen regimen.  Continue Mucinex and flutter valve.    Left lower quadrant abdominal pain/colonic wall thickening Apparently this has been ongoing for some time.  Plan was for outpatient colonoscopy in the next few months.  However patient does report weight loss of about 40 pounds in the last few months.  Since he is symptomatic gastroenterology was consulted.  Seen by cardiology and cleared for endoscopy.   Sigmoidoscopy was attempted yesterday but found to have a lot of stool in his colon so that was aborted.  Plan is for colonoscopy tomorrow if he is appropriately prepped.    Chronic diastolic CHF/history of aortic valve stenosis status post replacement/coronary artery disease Followed by cardiology.  Last  echocardiogram from February showed normal systolic function.  Seems to be fairly euvolemic at this time.  Paroxysmal atrial fibrillation Not noted to be on any rate limiting medications.  Was on apixaban prior to admission which is currently on hold for endoscopy. Remains on IV heparin.    Constipation See above  Chronic kidney disease stage IIIb Seems to be close to baseline.  Monitor urine output.  Avoid nephrotoxic agents.  History of COPD on chronic steroids Stable.  Continue prednisone.  Hypothyroidism Continue home medication regimen.  Diabetes mellitus type 2 HbA1c 6.5.  Continue SSI.  Noted to be on Jardiance prior to admission.  CBGs are reasonably well controlled.  DVT Prophylaxis: On IV heparin  Code Status: Full code Family Communication: Discussed with patient Disposition Plan: Hopefully return home when improved.  Start mobilizing.  Status is: Inpatient Remains inpatient appropriate because: Pneumonia, abdominal pain, weight loss      Medications: Scheduled:  amLODipine  2.5 mg Oral Daily   azithromycin  500 mg Oral Daily   gabapentin  600 mg Oral Daily   And   gabapentin  1,200 mg Oral QHS   guaiFENesin  600 mg Oral BID   insulin aspart  0-15 Units Subcutaneous TID WC   insulin aspart  0-5 Units Subcutaneous QHS   levothyroxine  175 mcg Oral QAC breakfast   mometasone-formoterol  2 puff Inhalation BID   And   umeclidinium bromide  1 puff Inhalation Daily   montelukast  10 mg Oral QHS   multivitamin with minerals  1 tablet Oral Daily   pantoprazole  40 mg Oral QAC breakfast   polyethylene glycol  17 g Oral Daily  polyethylene glycol-electrolytes  4,000 mL Oral Once   predniSONE  2.5 mg Oral Q breakfast   rOPINIRole  1 mg Oral BID   rosuvastatin  10 mg Oral QHS   senna  1 tablet Oral BID   spironolactone  25 mg Oral Daily   torsemide  30 mg Oral QODAY   traZODone  50 mg Oral BID   Continuous:  cefTRIAXone (ROCEPHIN)  IV 2 g (12/31/21 1014)    heparin 1,350 Units/hr (12/31/21 1008)   OQH:[id number] **OR** acetaminophen, albuterol, gabapentin, HYDROmorphone (DILAUDID) injection, ondansetron **OR** ondansetron (ZOFRAN) IV, oxyCODONE, polyvinyl alcohol  Antibiotics: Anti-infectives (From admission, onward)    Start     Dose/Rate Route Frequency Ordered Stop   12/29/21 1000  cefTRIAXone (ROCEPHIN) 2 g in sodium chloride 0.9 % 100 mL IVPB  Status:  Discontinued        2 g 200 mL/hr over 30 Minutes Intravenous Every 24 hours 12/28/21 1318 12/28/21 1325   12/29/21 1000  azithromycin (ZITHROMAX) tablet 500 mg        500 mg Oral Daily 12/28/21 1318 01/02/22 0959   12/29/21 1000  cefTRIAXone (ROCEPHIN) 2 g in sodium chloride 0.9 % 100 mL IVPB        2 g 200 mL/hr over 30 Minutes Intravenous Every 24 hours 12/28/21 1325 01/02/22 0959   12/28/21 0300  cefTRIAXone (ROCEPHIN) 1 g in sodium chloride 0.9 % 100 mL IVPB        1 g 200 mL/hr over 30 Minutes Intravenous  Once 12/28/21 0251 12/28/21 0309   12/28/21 0245  cefTRIAXone (ROCEPHIN) injection 1 g  Status:  Discontinued        1 g Intramuscular  Once 12/28/21 0230 12/28/21 0250   12/28/21 0245  azithromycin (ZITHROMAX) 500 mg in sodium chloride 0.9 % 250 mL IVPB        500 mg 250 mL/hr over 60 Minutes Intravenous  Once 12/28/21 0230 12/28/21 0454       Objective:  Vital Signs  Vitals:   12/30/21 1159 12/30/21 1944 12/30/21 2144 12/31/21 0434  BP: 131/70  124/72 118/68  Pulse: 67  64 66  Resp: '16  20 17  '$ Temp: 97.6 F (36.4 C)  98 F (36.7 C) 98.5 F (36.9 C)  TempSrc: Oral     SpO2: 96% 96% 100% 100%  Weight:      Height:        Intake/Output Summary (Last 24 hours) at 12/31/2021 1029 Last data filed at 12/30/2021 1900 Gross per 24 hour  Intake 507.31 ml  Output --  Net 507.31 ml    Filed Weights   12/28/21 1316 12/30/21 1101  Weight: 74.1 kg 74.1 kg    General appearance: Awake alert.  In no distress Resp: Improved air entry bilaterally.  Less crackles.   No wheezing or rhonchi. Cardio: S1-S2 is normal regular.  No S3-S4.  No rubs murmurs or bruit GI: Abdomen is soft.  Tender in the left lower quadrant without any rebound rigidity or guarding.  No masses organomegaly. Extremities: No edema.  Full range of motion of lower extremities. Neurologic: Alert and oriented x3.  No focal neurological deficits.      Lab Results:  Data Reviewed: I have personally reviewed following labs and reports of the imaging studies  CBC: Recent Labs  Lab 12/27/21 2304 12/29/21 0514 12/30/21 0540 12/31/21 0432  WBC 21.9* 13.9* 7.9 8.2  HGB 14.7 14.3 12.9* 13.6  HCT 46.4 44.6 40.6 42.4  MCV 91.2  90.5 90.4 89.5  PLT 166 158 160 183     Basic Metabolic Panel: Recent Labs  Lab 12/27/21 2311 12/29/21 0514 12/31/21 0432  NA 137 141 137  K 4.4 4.4 4.2  CL 104 101 98  CO2 '25 28 29  '$ GLUCOSE 141* 90 109*  BUN 27* 28* 23  CREATININE 1.79* 1.72* 1.73*  CALCIUM 9.5 9.5 9.6     GFR: Estimated Creatinine Clearance: 40.5 mL/min (A) (by C-G formula based on SCr of 1.73 mg/dL (H)).  Liver Function Tests: Recent Labs  Lab 12/27/21 2311 12/29/21 0514  AST 20 16  ALT 17 15  ALKPHOS 84 83  BILITOT 1.7* 1.9*  PROT 7.4 7.5  ALBUMIN 4.1 4.1     Recent Labs  Lab 12/28/21 1328  LIPASE 26     Coagulation Profile: Recent Labs  Lab 12/28/21 1154  INR 1.4*      HbA1C: No results for input(s): "HGBA1C" in the last 72 hours.   CBG: Recent Labs  Lab 12/30/21 0755 12/30/21 1129 12/30/21 1649 12/30/21 2150 12/31/21 0820  GLUCAP 95 92 96 119* 110*      Recent Results (from the past 240 hour(s))  Resp Panel by RT-PCR (Flu A&B, Covid) Anterior Nasal Swab     Status: None   Collection Time: 12/27/21 11:11 PM   Specimen: Anterior Nasal Swab  Result Value Ref Range Status   SARS Coronavirus 2 by RT PCR NEGATIVE NEGATIVE Final    Comment: (NOTE) SARS-CoV-2 target nucleic acids are NOT DETECTED.  The SARS-CoV-2 RNA is generally  detectable in upper respiratory specimens during the acute phase of infection. The lowest concentration of SARS-CoV-2 viral copies this assay can detect is 138 copies/mL. A negative result does not preclude SARS-Cov-2 infection and should not be used as the sole basis for treatment or other patient management decisions. A negative result may occur with  improper specimen collection/handling, submission of specimen other than nasopharyngeal swab, presence of viral mutation(s) within the areas targeted by this assay, and inadequate number of viral copies(<138 copies/mL). A negative result must be combined with clinical observations, patient history, and epidemiological information. The expected result is Negative.  Fact Sheet for Patients:  EntrepreneurPulse.com.au  Fact Sheet for Healthcare Providers:  IncredibleEmployment.be  This test is no t yet approved or cleared by the [place] FDA and  has been authorized for detection and/or diagnosis of SARS-CoV-2 by FDA under an Emergency Use Authorization (EUA). This EUA will remain  in effect (meaning this test can be used) for the duration of the COVID-19 declaration under Section 564(b)(1) of the Act, 21 U.S.C.section 360bbb-3(b)(1), unless the authorization is terminated  or revoked sooner.       Influenza A by PCR NEGATIVE NEGATIVE Final   Influenza B by PCR NEGATIVE NEGATIVE Final    Comment: (NOTE) The Xpert Xpress SARS-CoV-2/FLU/RSV plus assay is intended as an aid in the diagnosis of influenza from Nasopharyngeal swab specimens and should not be used as a sole basis for treatment. Nasal washings and aspirates are unacceptable for Xpert Xpress SARS-CoV-2/FLU/RSV testing.  Fact Sheet for Patients: EntrepreneurPulse.com.au  Fact Sheet for Healthcare Providers: IncredibleEmployment.be  This test is not yet approved or cleared by the [place] FDA  and has been authorized for detection and/or diagnosis of SARS-CoV-2 by FDA under an Emergency Use Authorization (EUA). This EUA will remain in effect (meaning this test can be used) for the duration of the COVID-19 declaration under Section 564(b)(1) of the Act, 21  U.S.C. section 360bbb-3(b)(1), unless the authorization is terminated or revoked.  Performed at [facility], [place] [address], [place], [place] [address]       Radiology Studies: No results found.     LOS: 3 days   Margurette Brener [employer] on [email or web address]  12/31/2021, 10:29 AM

## 2021-12-31 NOTE — Progress Notes (Signed)
[facility] Progress Note  [name] 71 y.o. Apr 11, 1951   Subjective: Tolerated Miralax prep. Reports clear stools this morning but when asked to describe it he describes pieces of brown stool in it. Denies abdominal pain.  Objective: Vital signs: Vitals:   12/30/21 2144 12/31/21 0434  BP: 124/72 118/68  Pulse: 64 66  Resp: 20 17  Temp: 98 F (36.7 C) 98.5 F (36.9 C)  SpO2: 100% 100%    Physical Exam: Gen: alert, no acute distress, well-nourished HEENT: anicteric sclera CV: RRR Chest: CTA B Abd: lower abdominal tenderness with guarding, soft, nondistended, +BS Ext: no edema  Lab Results: Recent Labs    12/29/21 0514 12/31/21 0432  NA 141 137  K 4.4 4.2  CL 101 98  CO2 28 29  GLUCOSE 90 109*  BUN 28* 23  CREATININE 1.72* 1.73*  CALCIUM 9.5 9.6   Recent Labs    12/29/21 0514  AST 16  ALT 15  ALKPHOS 83  BILITOT 1.9*  PROT 7.5  ALBUMIN 4.1   Recent Labs    12/30/21 0540 12/31/21 0432  WBC 7.9 8.2  HGB 12.9* 13.6  HCT 40.6 42.4  MCV 90.4 89.5  PLT 160 183      Assessment/Plan: Abnormal CT (sigmoid thickening) and weight loss in need of colonoscopy. Poor prep on flex sig yesterday so Miralax prep given, which he tolerated better but plan is for NuLytely today and strongly encouraged him to drink all of it. Colonoscopy tomorrow at 145pm by Dr. Alessandra [name] and Heparin IV needs to be held 4 hours prior. Pharmacy and Dr. Maryland [name] aware of this need. Clear liquid diet. NPO p MN.   [name] 12/31/2021, 12:54 PM  Questions please call [phone] Patient ID: [name], male   DOB: 1950/12/16, 71 y.o.   MRN: [id number]

## 2021-12-31 NOTE — H&P (View-Only) (Signed)
[facility]astroenterology Progress Note  [name] 71 y.o. 15-Dec-1950   Subjective: Tolerated Miralax prep. Reports clear stools this morning but when asked to describe it he describes pieces of brown stool in it. Denies abdominal pain.  Objective: Vital signs: Vitals:   12/30/21 2144 12/31/21 0434  BP: 124/72 118/68  Pulse: 64 66  Resp: 20 17  Temp: 98 F (36.7 C) 98.5 F (36.9 C)  SpO2: 100% 100%    Physical Exam: Gen: alert, no acute distress, well-nourished HEENT: anicteric sclera CV: RRR Chest: CTA B Abd: lower abdominal tenderness with guarding, soft, nondistended, +BS Ext: no edema  Lab Results: Recent Labs    12/29/21 0514 12/31/21 0432  NA 141 137  K 4.4 4.2  CL 101 98  CO2 28 29  GLUCOSE 90 109*  BUN 28* 23  CREATININE 1.72* 1.73*  CALCIUM 9.5 9.6   Recent Labs    12/29/21 0514  AST 16  ALT 15  ALKPHOS 83  BILITOT 1.9*  PROT 7.5  ALBUMIN 4.1   Recent Labs    12/30/21 0540 12/31/21 0432  WBC 7.9 8.2  HGB 12.9* 13.6  HCT 40.6 42.4  MCV 90.4 89.5  PLT 160 183      Assessment/Plan: Abnormal CT (sigmoid thickening) and weight loss in need of colonoscopy. Poor prep on flex sig yesterday so Miralax prep given, which he tolerated better but plan is for NuLytely today and strongly encouraged him to drink all of it. Colonoscopy tomorrow at 145pm by Dr. Alessandra Bevels and Heparin IV needs to be held 4 hours prior. Pharmacy and Dr. Maryland Pink aware of this need. Clear liquid diet. NPO p MN.   [name] 12/31/2021, 12:54 PM  Questions please call [phone] Patient ID: [name], male   DOB: 30-Jun-1950, 71 y.o.   MRN: [id number]

## 2021-12-31 NOTE — Plan of Care (Signed)

## 2022-01-01 ENCOUNTER — Encounter (HOSPITAL_COMMUNITY): Payer: Self-pay | Admitting: Internal Medicine

## 2022-01-01 ENCOUNTER — Inpatient Hospital Stay (HOSPITAL_COMMUNITY): Payer: Medicare Other | Admitting: Certified Registered"

## 2022-01-01 ENCOUNTER — Encounter (HOSPITAL_COMMUNITY)
Admission: EM | Disposition: A | Discharge status: Home / Self Care | Payer: Self-pay | Source: Home / Self Care | Attending: Internal Medicine

## 2022-01-01 DIAGNOSIS — K6389 Other specified diseases of intestine: Secondary | ICD-10-CM

## 2022-01-01 DIAGNOSIS — K529 Noninfective gastroenteritis and colitis, unspecified: Secondary | ICD-10-CM

## 2022-01-01 DIAGNOSIS — I480 Paroxysmal atrial fibrillation: Secondary | ICD-10-CM | POA: Diagnosis not present

## 2022-01-01 DIAGNOSIS — K648 Other hemorrhoids: Secondary | ICD-10-CM

## 2022-01-01 DIAGNOSIS — N1832 Chronic kidney disease, stage 3b: Secondary | ICD-10-CM | POA: Diagnosis not present

## 2022-01-01 DIAGNOSIS — R1032 Left lower quadrant pain: Secondary | ICD-10-CM | POA: Diagnosis not present

## 2022-01-01 DIAGNOSIS — E039 Hypothyroidism, unspecified: Secondary | ICD-10-CM

## 2022-01-01 DIAGNOSIS — K5730 Diverticulosis of large intestine without perforation or abscess without bleeding: Secondary | ICD-10-CM

## 2022-01-01 DIAGNOSIS — J189 Pneumonia, unspecified organism: Secondary | ICD-10-CM | POA: Diagnosis not present

## 2022-01-01 HISTORY — PX: BIOPSY: SHX5522

## 2022-01-01 HISTORY — PX: COLONOSCOPY WITH PROPOFOL: SHX5780

## 2022-01-01 LAB — CBC
HCT: 40.9 % (ref 39.0–52.0)
Hemoglobin: 13.2 g/dL (ref 13.0–17.0)
MCH: 28.8 pg (ref 26.0–34.0)
MCHC: 32.3 g/dL (ref 30.0–36.0)
MCV: 89.3 fL (ref 80.0–100.0)
Platelets: 170 10*3/uL (ref 150–400)
RBC: 4.58 MIL/uL (ref 4.22–5.81)
RDW: 16.9 % — ABNORMAL HIGH (ref 11.5–15.5)
WBC: 8.5 10*3/uL (ref 4.0–10.5)
nRBC: 0 % (ref 0.0–0.2)

## 2022-01-01 LAB — GLUCOSE, CAPILLARY
Glucose-Capillary: 111 mg/dL — ABNORMAL HIGH (ref 70–99)
Glucose-Capillary: 107 mg/dL — ABNORMAL HIGH (ref 70–99)
Glucose-Capillary: 116 mg/dL — ABNORMAL HIGH (ref 70–99)
Glucose-Capillary: 126 mg/dL — ABNORMAL HIGH (ref 70–99)

## 2022-01-01 LAB — APTT: aPTT: 85 seconds — ABNORMAL HIGH (ref 24–36)

## 2022-01-01 SURGERY — COLONOSCOPY WITH PROPOFOL
Anesthesia: Monitor Anesthesia Care

## 2022-01-01 MED ORDER — APIXABAN 5 MG PO TABS
5.0000 mg | ORAL_TABLET | Freq: Two times a day (BID) | ORAL | Status: DC
  Administered 2022-01-01 – 2022-01-02 (×2): 5 mg via ORAL
  Filled 2022-01-01 (×3): qty 1

## 2022-01-01 MED ORDER — PROPOFOL 500 MG/50ML IV EMUL
INTRAVENOUS | Status: DC | PRN
  Administered 2022-01-01: 125 ug/kg/min via INTRAVENOUS

## 2022-01-01 MED ORDER — LACTATED RINGERS IV SOLN: INTRAVENOUS | Status: DC | PRN

## 2022-01-01 MED ORDER — SODIUM CHLORIDE 0.9 % IV SOLN
2.0000 g | INTRAVENOUS | Status: AC
  Administered 2022-01-01: 2 g via INTRAVENOUS
  Filled 2022-01-01: qty 20

## 2022-01-01 MED ORDER — PROPOFOL 10 MG/ML IV BOLUS
INTRAVENOUS | Status: DC | PRN
  Administered 2022-01-01 (×2): 30 mg via INTRAVENOUS

## 2022-01-01 MED ORDER — LIDOCAINE 2% (20 MG/ML) 5 ML SYRINGE
INTRAMUSCULAR | Status: DC | PRN
  Administered 2022-01-01: 40 mg via INTRAVENOUS

## 2022-01-01 MED ORDER — SODIUM CHLORIDE 0.9 % IV SOLN: INTRAVENOUS | Status: DC

## 2022-01-01 SURGICAL SUPPLY — 22 items

## 2022-01-01 NOTE — Interval H&P Note (Signed)
History and Physical Interval Note:  01/01/2022 1:21 PM  [name]  has presented today for surgery, with the diagnosis of abnormal ct scan.  The various methods of treatment have been discussed with the patient and family. After consideration of risks, benefits and other options for treatment, the patient has consented to  Procedure(s): COLONOSCOPY WITH PROPOFOL (N/A) as a surgical intervention.  The patient's history has been reviewed, patient examined, no change in status, stable for surgery.  I have reviewed the patient's chart and labs.  Questions were answered to the patient's satisfaction.     [name]

## 2022-01-01 NOTE — Progress Notes (Signed)
[facility] PROGRESS NOTE   [name] GNF:[id number] DOB: 1950-12-25 DOA: 12/27/2021  PCP: Velna Hatchet, MD  Brief History/Interval Summary: 71 y.o. male with medical history significant for a fib on eliquis, CKD 3b, chronic diastolic HF, DM, COPD on 2L qHS, HTN, HLD, hypothyroidism, CHB s/p PPM. Presenting with right chest pain and LLQ abdominal pain.  Found to have right-sided pneumonia.  CT scan raised concern for colonic wall thickening.  Patient was hospitalized for further management.    Consultants: [facility] gastroenterology.  Cardiology.  Procedures: Colonoscopy is planned for today    Subjective/Interval History: Patient mentions that his stool is now clear and yellowish.  Denies any abdominal pain nausea or vomiting.  Cough and shortness of breath has improved.      Assessment/Plan:  Community-acquired pneumonia/chronic respiratory failure with hypoxia/pleuritic chest pain Chest pain most likely secondary to pneumonia noted on CT scan.  Venous thromboembolism seems less likely since the patient was already on apixaban. Patient improving with antibiotics.  Saturations are normal on his usual home oxygen regimen.  Continue Mucinex and flutter valve.   Respiratory status is now back to baseline. He will complete 5 days of antibiotics today.  Left lower quadrant abdominal pain/colonic wall thickening Apparently this has been ongoing for some time.  Plan was for outpatient colonoscopy in the next few months.  However patient does report weight loss of about 40 pounds in the last few months.  Since he is symptomatic gastroenterology was consulted.  Seen by cardiology and cleared for endoscopy.   Sigmoidoscopy was attempted on 9/3 but found to have a lot of stool in his colon so that was aborted.  Plan is for colonoscopy this afternoon.   Chronic diastolic CHF/history of aortic valve stenosis status post replacement/coronary artery disease Followed by cardiology.   Last echocardiogram from February showed normal systolic function.  Seems to be fairly euvolemic at this time. Holding torsemide and Aldactone.  Can be resumed from tomorrow.  Paroxysmal atrial fibrillation Not noted to be on any rate limiting medications.  Was on apixaban prior to admission which is currently on hold for endoscopy. Remains on IV heparin.    Constipation See above  Chronic kidney disease stage IIIb Seems to be close to baseline.  Monitor urine output.  Avoid nephrotoxic agents.  History of COPD on chronic steroids Stable.  Continue prednisone.  Hypothyroidism Continue home medication regimen.  Diabetes mellitus type 2 HbA1c 6.5.  Continue SSI.  Noted to be on Jardiance prior to admission.  CBGs are reasonably well controlled.  DVT Prophylaxis: On IV heparin  Code Status: Full code Family Communication: Discussed with patient Disposition Plan: Hopefully return home when improved.  Start mobilizing.  Status is: Inpatient Remains inpatient appropriate because: Pneumonia, abdominal pain, weight loss      Medications: Scheduled:  amLODipine  2.5 mg Oral Daily   gabapentin  600 mg Oral Daily   And   gabapentin  1,200 mg Oral QHS   guaiFENesin  600 mg Oral BID   insulin aspart  0-15 Units Subcutaneous TID WC   insulin aspart  0-5 Units Subcutaneous QHS   levothyroxine  175 mcg Oral QAC breakfast   mometasone-formoterol  2 puff Inhalation BID   And   umeclidinium bromide  1 puff Inhalation Daily   montelukast  10 mg Oral QHS   multivitamin with minerals  1 tablet Oral Daily   pantoprazole  40 mg Oral QAC breakfast   polyethylene glycol  17 g  Oral Daily   predniSONE  2.5 mg Oral Q breakfast   rOPINIRole  1 mg Oral BID   rosuvastatin  10 mg Oral QHS   senna  1 tablet Oral BID   [START ON 01/02/2022] spironolactone  25 mg Oral Daily   [START ON 01/02/2022] torsemide  30 mg Oral QODAY   traZODone  50 mg Oral BID   Continuous:  cefTRIAXone (ROCEPHIN)  IV 2 g  (12/31/21 1014)   KDX:[id number] **OR** acetaminophen, albuterol, gabapentin, HYDROmorphone (DILAUDID) injection, ondansetron **OR** ondansetron (ZOFRAN) IV, oxyCODONE, polyvinyl alcohol  Antibiotics: Anti-infectives (From admission, onward)    Start     Dose/Rate Route Frequency Ordered Stop   12/29/21 1000  cefTRIAXone (ROCEPHIN) 2 g in sodium chloride 0.9 % 100 mL IVPB  Status:  Discontinued        2 g 200 mL/hr over 30 Minutes Intravenous Every 24 hours 12/28/21 1318 12/28/21 1325   12/29/21 1000  azithromycin (ZITHROMAX) tablet 500 mg        500 mg Oral Daily 12/28/21 1318 01/01/22 0905   12/29/21 1000  cefTRIAXone (ROCEPHIN) 2 g in sodium chloride 0.9 % 100 mL IVPB        2 g 200 mL/hr over 30 Minutes Intravenous Every 24 hours 12/28/21 1325 01/02/22 0959   12/28/21 0300  cefTRIAXone (ROCEPHIN) 1 g in sodium chloride 0.9 % 100 mL IVPB        1 g 200 mL/hr over 30 Minutes Intravenous  Once 12/28/21 0251 12/28/21 0309   12/28/21 0245  cefTRIAXone (ROCEPHIN) injection 1 g  Status:  Discontinued        1 g Intramuscular  Once 12/28/21 0230 12/28/21 0250   12/28/21 0245  azithromycin (ZITHROMAX) 500 mg in sodium chloride 0.9 % 250 mL IVPB        500 mg 250 mL/hr over 60 Minutes Intravenous  Once 12/28/21 0230 12/28/21 0454       Objective:  Vital Signs  Vitals:   12/31/21 1315 12/31/21 2139 01/01/22 0417 01/01/22 0801  BP: 116/63 128/82 127/64   Pulse: (!) 59 (!) 59 66   Resp: '18 20 18   '$ Temp: 98.2 F (36.8 C) 98.5 F (36.9 C) 98.4 F (36.9 C)   TempSrc: Oral Oral Oral   SpO2: 100% 99% 100% 99%  Weight:      Height:        Intake/Output Summary (Last 24 hours) at 01/01/2022 0921 Last data filed at 01/01/2022 0908 Gross per 24 hour  Intake 1424.69 ml  Output --  Net 1424.69 ml    Filed Weights   12/28/21 1316 12/30/21 1101  Weight: 74.1 kg 74.1 kg    General appearance: Awake alert.  In no distress Resp: Improved air entry bilaterally.  Fewer crackles today  compared to yesterday.  No wheezing or rhonchi. Cardio: S1-S2 is normal regular.  No S3-S4.  No rubs murmurs or bruit GI: Abdomen is soft.  Nontender nondistended.  Bowel sounds are present normal.  No masses organomegaly Extremities: No edema.  Full range of motion of lower extremities. Neurologic:   No focal neurological deficits.     Lab Results:  Data Reviewed: I have personally reviewed following labs and reports of the imaging studies  CBC: Recent Labs  Lab 12/27/21 2304 12/29/21 0514 12/30/21 0540 12/31/21 0432 01/01/22 0502  WBC 21.9* 13.9* 7.9 8.2 8.5  HGB 14.7 14.3 12.9* 13.6 13.2  HCT 46.4 44.6 40.6 42.4 40.9  MCV 91.2 90.5 90.4 89.5  89.3  PLT 166 158 160 183 170     Basic Metabolic Panel: Recent Labs  Lab 12/27/21 2311 12/29/21 0514 12/31/21 0432  NA 137 141 137  K 4.4 4.4 4.2  CL 104 101 98  CO2 '25 28 29  '$ GLUCOSE 141* 90 109*  BUN 27* 28* 23  CREATININE 1.79* 1.72* 1.73*  CALCIUM 9.5 9.5 9.6     GFR: Estimated Creatinine Clearance: 40.5 mL/min (A) (by C-G formula based on SCr of 1.73 mg/dL (H)).  Liver Function Tests: Recent Labs  Lab 12/27/21 2311 12/29/21 0514  AST 20 16  ALT 17 15  ALKPHOS 84 83  BILITOT 1.7* 1.9*  PROT 7.4 7.5  ALBUMIN 4.1 4.1     Recent Labs  Lab 12/28/21 1328  LIPASE 26     Coagulation Profile: Recent Labs  Lab 12/28/21 1154  INR 1.4*      CBG: Recent Labs  Lab 12/31/21 0820 12/31/21 1317 12/31/21 1754 12/31/21 2140 01/01/22 0731  GLUCAP 110* 137* 95 133* 111*      Recent Results (from the past 240 hour(s))  Resp Panel by RT-PCR (Flu A&B, Covid) Anterior Nasal Swab     Status: None   Collection Time: 12/27/21 11:11 PM   Specimen: Anterior Nasal Swab  Result Value Ref Range Status   SARS Coronavirus 2 by RT PCR NEGATIVE NEGATIVE Final    Comment: (NOTE) SARS-CoV-2 target nucleic acids are NOT DETECTED.  The SARS-CoV-2 RNA is generally detectable in upper respiratory specimens during  the acute phase of infection. The lowest concentration of SARS-CoV-2 viral copies this assay can detect is 138 copies/mL. A negative result does not preclude SARS-Cov-2 infection and should not be used as the sole basis for treatment or other patient management decisions. A negative result may occur with  improper specimen collection/handling, submission of specimen other than nasopharyngeal swab, presence of viral mutation(s) within the areas targeted by this assay, and inadequate number of viral copies(<138 copies/mL). A negative result must be combined with clinical observations, patient history, and epidemiological information. The expected result is Negative.  Fact Sheet for Patients:  EntrepreneurPulse.com.au  Fact Sheet for Healthcare Providers:  IncredibleEmployment.be  This test is no t yet approved or cleared by the [place] FDA and  has been authorized for detection and/or diagnosis of SARS-CoV-2 by FDA under an Emergency Use Authorization (EUA). This EUA will remain  in effect (meaning this test can be used) for the duration of the COVID-19 declaration under Section 564(b)(1) of the Act, 21 U.S.C.section 360bbb-3(b)(1), unless the authorization is terminated  or revoked sooner.       Influenza A by PCR NEGATIVE NEGATIVE Final   Influenza B by PCR NEGATIVE NEGATIVE Final    Comment: (NOTE) The Xpert Xpress SARS-CoV-2/FLU/RSV plus assay is intended as an aid in the diagnosis of influenza from Nasopharyngeal swab specimens and should not be used as a sole basis for treatment. Nasal washings and aspirates are unacceptable for Xpert Xpress SARS-CoV-2/FLU/RSV testing.  Fact Sheet for Patients: EntrepreneurPulse.com.au  Fact Sheet for Healthcare Providers: IncredibleEmployment.be  This test is not yet approved or cleared by the [place] FDA and has been authorized for detection and/or  diagnosis of SARS-CoV-2 by FDA under an Emergency Use Authorization (EUA). This EUA will remain in effect (meaning this test can be used) for the duration of the COVID-19 declaration under Section 564(b)(1) of the Act, 21 U.S.C. section 360bbb-3(b)(1), unless the authorization is terminated or revoked.  Performed at [facility]  [facility] [address] [place], [place] [address]       Radiology Studies: No results found.     LOS: 4 days   Muaad Boehning [employer] on [email or web address]  01/01/2022, 9:21 AM

## 2022-01-01 NOTE — Progress Notes (Signed)
ANTICOAGULATION CONSULT NOTE  Pharmacy Consult for heparin Indication: atrial fibrillation  Allergies  Allergen Reactions   Meloxicam Rash and Other (See Comments)    Red Man Syndrome, also   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 syndrome 09/02/15, resolved with diphenhydramine and slowing of rate    Patient Measurements: Height: '5\' 11"'$  (180.3 cm) Weight: 74.1 kg (163 lb 5.8 oz) IBW/kg (Calculated) : 75.3 Heparin Dosing Weight: 74.1 kg  Vital Signs: Temp: 98.4 F (36.9 C) (09/05 0417) Temp Source: Oral (09/05 0417) BP: 127/64 (09/05 0417) Pulse Rate: 66 (09/05 0417)  Labs: Recent Labs    12/30/21 0540 12/31/21 0432 01/01/22 0502  HGB 12.9* 13.6 13.2  HCT 40.6 42.4 40.9  PLT 160 183 170  APTT 87* 82* 85*  HEPARINUNFRC 0.87* 0.44  --   CREATININE  --  1.73*  --     Estimated Creatinine Clearance: 40.5 mL/min (A) (by C-G formula based on SCr of 1.73 mg/dL (H)).   Medications:  PTA Eliquis 5 mg BID (last dose: 8/29 pm)  Assessment: 67 yoM with chronic afib on Eliquis PTA admitted 12/27/21 with chest and abdominal pain. Heparin held and later resumed on 9/3 for colonoscopy which was unable to adequately visualize sigmoid colon. Repeat colonoscopy planned 01/01/22 for which anticoagulation will need to be held. Pharmacy is consulted for management of heparin therapy while Eliquis is on hold.  Today, 01/01/22: - aPTT 85 sec, therapeutic on 1350 units/hr heparin gtt  - Heparin level omitted this am - Hgb and Plts WNL - No bleeding documented - Colonoscopy scheduled today at 1345  Goal of Therapy:  Heparin level 0.3-0.7 units/ml aPTT 66-102 seconds Monitor platelets by anticoagulation protocol: Yes   Plan:  - Continue heparin gtt at 1350 units/hr - Hold heparin at 0900 - Daily HL, CBC - Monitor for signs and symptoms of bleeding - F/u plan to resume DOAC vs heparin gtt after colonoscopy  Cannon Kettle, PharmD Candidate 01/01/2022 8:07 AM

## 2022-01-01 NOTE — Anesthesia Postprocedure Evaluation (Signed)
Anesthesia Post Note  Patient: [name]  Procedure(s) Performed: COLONOSCOPY WITH PROPOFOL BIOPSY     Patient location during evaluation: PACU Anesthesia Type: MAC Level of consciousness: awake and alert Pain management: pain level controlled Vital Signs Assessment: post-procedure vital signs reviewed and stable Respiratory status: spontaneous breathing, nonlabored ventilation, respiratory function stable and patient connected to nasal cannula oxygen Cardiovascular status: stable and blood pressure returned to baseline Postop Assessment: no apparent nausea or vomiting Anesthetic complications: no   No notable events documented.  Last Vitals:  Vitals:   01/01/22 1428 01/01/22 1430  BP:  (!) 147/76  Pulse: 60 60  Resp: 12 13  Temp:    SpO2: 100% 100%    Last Pain:  Vitals:   01/01/22 1307  TempSrc: Temporal  PainSc: 5                  Cleone Hulick S

## 2022-01-01 NOTE — Progress Notes (Signed)
ANTICOAGULATION CONSULT NOTE - Follow Up Consult  Pharmacy Consult for Eliquis  Indication: atrial fibrillation  Allergies  Allergen Reactions   Meloxicam Rash and Other (See Comments)    Red Man Syndrome, also   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 syndrome 09/02/15, resolved with diphenhydramine and slowing of rate    Patient Measurements: Height: '5\' 11"'$  (180.3 cm) Weight: 74.1 kg (163 lb 5.8 oz) IBW/kg (Calculated) : 75.3  Vital Signs: Temp: 97.8 F (36.6 C) (09/05 1307) Temp Source: Temporal (09/05 1307) BP: 155/76 (09/05 1451) Pulse Rate: 59 (09/05 1451)  Labs: Recent Labs    12/30/21 0540 12/31/21 0432 01/01/22 0502  HGB 12.9* 13.6 13.2  HCT 40.6 42.4 40.9  PLT 160 183 170  APTT 87* 82* 85*  HEPARINUNFRC 0.87* 0.44  --   CREATININE  --  1.73*  --     Estimated Creatinine Clearance: 40.5 mL/min (A) (by C-G formula based on SCr of 1.73 mg/dL (H)).   Assessment:  AC/Heme: PTA Eliquis for afib-HOLD for colonoscopy. Bridge with IV heparin. - 9/3: 0600 IV hep off for colonoscopy (Poor prep and unable to   - 9/5: Colonoscopy: no evidence of mass lesion.mild fold thickening in the sigmoid colon biopsy was performed.--GI signed off  Goal of Therapy:  Therapeutic oral anticoagulation   Plan:  D/c IV heparin Resume Eliquis '5mg'$  BID Pharmacy will sign off. Please reconsult for further dosing assitance.   Nocholas Damaso S. Alford Highland, PharmD, BCPS Clinical Staff Pharmacist [email or web address]  Alford Highland, [employer] 01/01/2022,2:57 PM

## 2022-01-01 NOTE — Transfer of Care (Signed)
Immediate Anesthesia Transfer of Care Note  Patient: [name]  Procedure(s) Performed: COLONOSCOPY WITH PROPOFOL BIOPSY  Patient Location: PACU and Endoscopy Unit  Anesthesia Type:MAC  Level of Consciousness: awake  Airway & Oxygen Therapy: Patient Spontanous Breathing and Patient connected to face mask oxygen  Post-op Assessment: Report given to RN and Post -op Vital signs reviewed and stable  Post vital signs: Reviewed and stable  Last Vitals:  Vitals Value Taken Time  BP 134/66 01/01/22 1428  Temp    Pulse 60 01/01/22 1430  Resp 13 01/01/22 1430  SpO2 100 % 01/01/22 1430  Vitals shown include unvalidated device data.  Last Pain:  Vitals:   01/01/22 1307  TempSrc: Temporal  PainSc: 5       Patients Stated Pain Goal: 3 (73/73/66 8159)  Complications: No notable events documented.

## 2022-01-01 NOTE — Op Note (Signed)
[facility] Patient Name: [name] Procedure Date: 01/01/2022 MRN: [id number] Attending MD: Otis Brace , MD Date of Birth: 1950-10-10 CSN: [id number] Age: 71 Admit Type: Outpatient Procedure:                Colonoscopy Indications:              Abnormal CT of the GI tract Providers:                Otis Brace, MD, Ladoris Gene, RN, Theodora Blow, Technician Referring MD:              Medicines:                Sedation Administered by an Anesthesia Professional Complications:            No immediate complications. Estimated Blood Loss:     Estimated blood loss was minimal. Procedure:                Pre-Anesthesia Assessment:                           - Prior to the procedure, a History and Physical                            was performed, and patient medications and                            allergies were reviewed. The patient's tolerance of                            previous anesthesia was also reviewed. The risks                            and benefits of the procedure and the sedation                            options and risks were discussed with the patient.                            All questions were answered, and informed consent                            was obtained. Prior Anticoagulants: The patient has                            taken no previous anticoagulant or antiplatelet                            agents. ASA Grade Assessment: III - A patient with                            severe systemic disease. After reviewing the risks  and benefits, the patient was deemed in                            satisfactory condition to undergo the procedure.                           After obtaining informed consent, the colonoscope                            was passed under direct vision. Throughout the                            procedure, the patient's blood pressure, pulse, and                             oxygen saturations were monitored continuously. The                            [id number]-HQ190L ([id number]) Olympus colonoscope was                            introduced through the anus and advanced to the the                            terminal ileum, with identification of the                            appendiceal orifice and IC valve. The colonoscopy                            was performed without difficulty. The patient                            tolerated the procedure well. The quality of the                            bowel preparation was fair. Scope In: 2:06:49 PM Scope Out: 2:22:11 PM Scope Withdrawal Time: 0 hours 9 minutes 2 seconds  Total Procedure Duration: 0 hours 15 minutes 22 seconds  Findings:      The perianal and digital rectal examinations were normal.      The terminal ileum appeared normal.      There is no endoscopic evidence of mass in the entire colon.      A patchy area of mildly inflamed and thickened folds of the mucosa was       found in the sigmoid colon. Biopsies were taken with a cold forceps for       histology.      A few diverticula were found in the sigmoid colon.      Internal hemorrhoids were found during retroflexion. The hemorrhoids       were medium-sized. Impression:               - Preparation of the colon was fair.                           -  The examined portion of the ileum was normal.                           - Inflamed and thickened folds of the mucosa in the                            sigmoid colon. Biopsied.                           - Diverticulosis in the sigmoid colon.                           - Internal hemorrhoids. Moderate Sedation:      Moderate (conscious) sedation was personally administered by an       anesthesia professional. The following parameters were monitored: oxygen       saturation, heart rate, blood pressure, and response to care. Recommendation:           - Return patient to hospital ward for ongoing  care.                           - Soft diet.                           - Continue present medications.                           - Await pathology results.                           - Repeat colonoscopy date to be determined after                            pending pathology results are reviewed for                            surveillance based on pathology results.                           - Return to GI office in 2 months. Procedure Code(s):        --- Professional ---                           [phone], Colonoscopy, flexible; with biopsy, single                            or multiple Diagnosis Code(s):        --- Professional ---                           K64.8, Other hemorrhoids                           K52.9, Noninfective gastroenteritis and colitis,                            unspecified  K63.89, Other specified diseases of intestine                           K57.30, Diverticulosis of large intestine without                            perforation or abscess without bleeding                           R93.3, Abnormal findings on diagnostic imaging of                            other parts of digestive tract CPT copyright 2019 American Medical Association. All rights reserved. The codes documented in this report are preliminary and upon coder review may  be revised to meet current compliance requirements. Otis Brace, MD Otis Brace, MD 01/01/2022 2:29:58 PM Number of Addenda: 0

## 2022-01-01 NOTE — Brief Op Note (Signed)
12/27/2021 - 01/01/2022  2:30 PM  PATIENT:  [name]  71 y.o. male  PRE-OPERATIVE DIAGNOSIS:  abnormal ct scan  POST-OPERATIVE DIAGNOSIS:  Bx'd thick fold in sigmoid colon  PROCEDURE:  Procedure(s): COLONOSCOPY WITH PROPOFOL (N/A) BIOPSY  SURGEON:  Surgeon(s) and Role:    * Wesam Gearhart, MD - Primary  Findings ---------- -Colonoscopy today showed fair prep but there was no evidence of mass lesion.  Sigmoid colon was examined twice.  There was mild fold thickening in the sigmoid colon biopsy was performed.  Recommendations ----------------------- -Start soft diet and advance as tolerated -Okay to resume anticoagulation from GI standpoint -Recommend MiraLAX on a regular basis to avoid constipation -No further inpatient GI work-up planned.  GI will sign off.  Follow-up in GI office in 2 to 3 months after discharge.  Otis Brace MD, [place] 01/01/2022, 2:32 PM  Contact #  [phone]

## 2022-01-01 NOTE — Anesthesia Procedure Notes (Signed)
Procedure Name: MAC Date/Time: 01/01/2022 1:58 PM  Performed by: Cynda Familia, CRNAPre-anesthesia Checklist: Patient identified, Emergency Drugs available, Suction available, Patient being monitored and Timeout performed Patient Re-evaluated:Patient Re-evaluated prior to induction Oxygen Delivery Method: Simple face mask Placement Confirmation: positive ETCO2 and breath sounds checked- equal and bilateral Dental Injury: Teeth and Oropharynx as per pre-operative assessment

## 2022-01-01 NOTE — Anesthesia Preprocedure Evaluation (Signed)
Anesthesia Evaluation  Patient identified by MRN, date of birth, ID band Patient awake    Reviewed: Allergy & Precautions, NPO status , Patient's Chart, lab work & pertinent test results  Airway Mallampati: II  TM Distance: >3 FB Neck ROM: Full    Dental no notable dental hx.    Pulmonary shortness of breath, sleep apnea , COPD,  oxygen dependent, former smoker,    Pulmonary exam normal breath sounds clear to auscultation       Cardiovascular hypertension, Normal cardiovascular exam+ pacemaker + Valvular Problems/Murmurs AS  Rhythm:Regular Rate:Normal  S/P AVR   Neuro/Psych negative neurological ROS  negative psych ROS   GI/Hepatic negative GI ROS, Neg liver ROS,   Endo/Other  diabetes, Type 2Hypothyroidism   Renal/GU negative Renal ROS  negative genitourinary   Musculoskeletal negative musculoskeletal ROS (+)   Abdominal   Peds negative pediatric ROS (+)  Hematology negative hematology ROS (+)   Anesthesia Other Findings   Reproductive/Obstetrics negative OB ROS                             Anesthesia Physical Anesthesia Plan  ASA: 3  Anesthesia Plan: MAC   Post-op Pain Management: Minimal or no pain anticipated   Induction: Intravenous  PONV Risk Score and Plan: 1 and Propofol infusion and Treatment may vary due to age or medical condition  Airway Management Planned: Simple Face Mask  Additional Equipment:   Intra-op Plan:   Post-operative Plan:   Informed Consent: I have reviewed the patients History and Physical, chart, labs and discussed the procedure including the risks, benefits and alternatives for the proposed anesthesia with the patient or authorized representative who has indicated his/her understanding and acceptance.     Dental advisory given  Plan Discussed with: CRNA and Surgeon  Anesthesia Plan Comments:         Anesthesia Quick Evaluation

## 2022-01-01 NOTE — Care Plan (Signed)
Patient s/p endo procedure MD into talk with patient Report called to RN Patient transfer back to 1512

## 2022-01-01 NOTE — Progress Notes (Signed)
  Transition of Care [facility]) Screening Note   Patient Details  Name: [name] Date of Birth: 02-Jul-1950   Transition of Care [facility]) CM/SW Contact:    Vassie Moselle, [place] Phone Number: 01/01/2022, 10:55 AM    Transition of Care Department [facility]) has reviewed patient and no TOC needs have been identified at this time. We will continue to monitor patient advancement through interdisciplinary progression rounds. If new patient transition needs arise, please place a TOC consult.

## 2022-01-02 DIAGNOSIS — J189 Pneumonia, unspecified organism: Secondary | ICD-10-CM | POA: Diagnosis not present

## 2022-01-02 DIAGNOSIS — J9621 Acute and chronic respiratory failure with hypoxia: Secondary | ICD-10-CM

## 2022-01-02 DIAGNOSIS — I5032 Chronic diastolic (congestive) heart failure: Secondary | ICD-10-CM | POA: Diagnosis not present

## 2022-01-02 LAB — BASIC METABOLIC PANEL
Anion gap: 8 (ref 5–15)
BUN: 11 mg/dL (ref 8–23)
CO2: 26 mmol/L (ref 22–32)
Calcium: 9.2 mg/dL (ref 8.9–10.3)
Chloride: 102 mmol/L (ref 98–111)
Creatinine, Ser: 1.4 mg/dL — ABNORMAL HIGH (ref 0.61–1.24)
GFR, Estimated: 53 mL/min — ABNORMAL LOW (ref >60)
Glucose, Bld: 105 mg/dL — ABNORMAL HIGH (ref 70–99)
Potassium: 4.5 mmol/L (ref 3.5–5.1)
Sodium: 136 mmol/L (ref 135–145)

## 2022-01-02 LAB — GLUCOSE, CAPILLARY: Glucose-Capillary: 108 mg/dL — ABNORMAL HIGH (ref 70–99)

## 2022-01-02 MED ORDER — SENNA 8.6 MG PO TABS: 1.0000 | ORAL_TABLET | Freq: Two times a day (BID) | ORAL | 0 refills | Status: DC

## 2022-01-02 NOTE — Progress Notes (Signed)
PTScreen Note  Patient Details Name: [name] MRN: [id number] DOB: 07-12-1950   Cancelled Treatment:    Reason Eval/Treat Not Completed: PT screened, no needs identified, will sign off  Pt ambulating in hall independently (RN confirmed). Spoke with pt and he reports no issues with mobility/balance and no PT needs.  Will sign off.   [name], PT Acute Rehab [facility] Rehab [phone]  [name] 01/02/2022, 11:00 AM

## 2022-01-03 ENCOUNTER — Encounter (HOSPITAL_COMMUNITY): Payer: Self-pay | Admitting: Gastroenterology

## 2022-01-03 LAB — SURGICAL PATHOLOGY

## 2022-01-03 NOTE — Discharge Summary (Signed)
Physician Discharge Summary   Patient: [name] MRN: [id number] DOB: 02-19-1951  Admit date:     12/27/2021  Discharge date: 01/02/2022  Discharge Physician: Hosie Poisson   PCP: Velna Hatchet, MD   Recommendations at discharge:  Please follow up with PCp in one week.  Please follow up with GI for the biopsy results.  Please check cbc and bmp in one week.   Discharge Diagnoses: Principal Problem:   CAP (community acquired pneumonia) Active Problems:   Hypothyroidism   DM2 (diabetes mellitus, type 2) ([place])   Chronic kidney disease, stage 3b (HCC)   PAF (paroxysmal atrial fibrillation) (HCC)   Hypertension   Pulmonary hypertension (HCC)   GERD without esophagitis   Acute on chronic respiratory failure with hypoxia (HCC)   Chronic diastolic CHF (congestive heart failure) (HCC)   CAD (coronary artery disease)   COPD mixed type (HCC)   Abdominal pain   Constipation    Hospital Course:  71 y.o. male with medical history significant for a fib on eliquis, CKD 3b, chronic diastolic HF, DM, COPD on 2L qHS, HTN, HLD, hypothyroidism, CHB s/p PPM. Presenting with right chest pain and LLQ abdominal pain.  Found to have right-sided pneumonia.  CT scan raised concern for colonic wall thickening.  Patient was hospitalized for further management.     Assessment and Plan:   Community-acquired pneumonia/chronic respiratory failure with hypoxia/pleuritic chest pain Chest pain most likely secondary to pneumonia noted on CT scan.  Venous thromboembolism seems less likely since the patient was already on apixaban. Patient improving with antibiotics.  Saturations are normal on his usual home oxygen regimen.  Continue Mucinex and flutter valve.   Respiratory status is now back to baseline. He completed  5 days of antibiotics.   Left lower quadrant abdominal pain/colonic wall thickening Apparently this has been ongoing for some time.  Plan was for outpatient colonoscopy in the next few  months.  However patient does report weight loss of about 40 pounds in the last few months.  Since he is symptomatic gastroenterology was consulted.  Seen by cardiology and cleared for endoscopy.   Sigmoidoscopy was attempted on 9/3 but found to have a lot of stool in his colon so that was aborted.  Patient underwent colonoscopy , underwent biopsies and recommended to follow up with GI for biopsy results.    Chronic diastolic CHF/history of aortic valve stenosis status post replacement/coronary artery disease Followed by cardiology.  Last echocardiogram from February showed normal systolic function.  Seems to be fairly euvolemic at this time. Resume home meds on discharge.    Paroxysmal atrial fibrillation Not noted to be on any rate limiting medications.   Resume eliquis.    Constipation See above   Chronic kidney disease stage IIIb Seems to be close to baseline.  Monitor urine output.  Avoid nephrotoxic agents.   History of COPD on chronic steroids Stable.  Continue prednisone.   Hypothyroidism Continue home medication regimen.   Diabetes mellitus type 2 HbA1c 6.5. .  CBGs are reasonably well controlled.         Consultants: GI CARDIOLOGY Procedures performed: colonoscopy.   Disposition: Home Diet recommendation:  Discharge Diet Orders (From admission, onward)     Start     Ordered   01/02/22 0000  Diet - low sodium heart healthy        01/02/22 0952           Regular diet DISCHARGE MEDICATION: Allergies as of 01/02/2022  Reactions   Meloxicam Rash, Other (See Comments)   Red Man Syndrome, also   Vancomycin Other (See Comments)   Red Man's syndrome 09/02/15, resolved with diphenhydramine and slowing of rate        Medication List     STOP taking these medications    dicyclomine 10 MG capsule Commonly known as: BENTYL   LORazepam 1 MG tablet Commonly known as: Ativan   promethazine 25 MG tablet Commonly known as: PHENERGAN       TAKE  these medications    albuterol 108 (90 Base) MCG/ACT inhaler Commonly known as: VENTOLIN HFA Inhale 2 puffs into the lungs every 6 (six) hours as needed for wheezing or shortness of breath.   amLODipine 5 MG tablet Commonly known as: NORVASC Take 2.5 mg by mouth daily.   apixaban 5 MG Tabs tablet Commonly known as: ELIQUIS Take 1 tablet (5 mg total) by mouth 2 (two) times daily.   bisoprolol 5 MG tablet Commonly known as: ZEBETA Take 1 tablet (5 mg total) by mouth daily. Needs appt   Breztri Aerosphere 160-9-4.8 MCG/ACT Aero Generic drug: Budeson-Glycopyrrol-Formoterol Inhale 2 puffs into the lungs in the morning and at bedtime.   CAL-MAG-ZINC PO Take 1 tablet by mouth daily with breakfast.   empagliflozin 10 MG Tabs tablet Commonly known as: Jardiance Take 1 tablet (10 mg total) by mouth daily before breakfast. What changed: when to take this   fexofenadine 180 MG tablet Commonly known as: ALLEGRA Take 1 tablet (180 mg total) by mouth daily as needed for allergies or rhinitis.   gabapentin 300 MG capsule Commonly known as: NEURONTIN Take 600-1,200 mg by mouth See admin instructions. Take 600 mg by mouth in the morning, 1,200 mg at bedtime, and an additional 600 mg once a day as needed for neuropathy   levothyroxine 175 MCG tablet Commonly known as: SYNTHROID Take 175 mcg by mouth daily before breakfast.   montelukast 10 MG tablet Commonly known as: SINGULAIR TAKE 1 TABLET(10 MG) BY MOUTH AT BEDTIME What changed: See the new instructions.   multivitamin with minerals Tabs tablet Take 1 tablet by mouth daily.   ondansetron 4 MG disintegrating tablet Commonly known as: ZOFRAN-ODT Take 1 tablet (4 mg total) by mouth every 8 (eight) hours as needed for nausea or vomiting.   oxyCODONE-acetaminophen 5-325 MG tablet Commonly known as: PERCOCET/ROXICET Take 1 tablet by mouth 2 (two) times daily.   OXYGEN Inhale 2 L/min into the lungs See admin instructions. 2 L/min  at bedtime and 2 L/min throughout the day as needed for shortness of breath   oxymetazoline 0.05 % nasal spray Commonly known as: AFRIN Place 1 spray into both nostrils as needed (for nose bleeds).   pantoprazole 40 MG tablet Commonly known as: PROTONIX Take 40 mg by mouth daily before breakfast.   polyethylene glycol powder 17 GM/SCOOP powder Commonly known as: GLYCOLAX/MIRALAX Take 17 g by mouth daily as needed for mild constipation.   potassium chloride 10 MEQ tablet Commonly known as: KLOR-CON M Take 10 mEq by mouth See admin instructions. Take 10 mEq by mouth two times a day on the days when Torsemide is taken   predniSONE 5 MG tablet Commonly known as: DELTASONE Take 0.5 tablets (2.5 mg total) by mouth daily with breakfast.   rOPINIRole 1 MG tablet Commonly known as: REQUIP Take 1 tablet (1 mg total) by mouth in the morning and at bedtime.   rosuvastatin 10 MG tablet Commonly known as: CRESTOR Take 10  mg by mouth at bedtime.   senna 8.6 MG Tabs tablet Commonly known as: SENOKOT Take 1 tablet (8.6 mg total) by mouth 2 (two) times daily.   spironolactone 25 MG tablet Commonly known as: ALDACTONE Take 1 tablet (25 mg total) by mouth daily.   Systane Ultra PF 0.4-0.3 % Soln Generic drug: Polyethyl Glyc-Propyl Glyc PF Apply 1 drop to eye 3 (three) times daily as needed (for dryness).   TIGER BALM ARTHRITIS RUB EX Apply 1 application topically as needed (for leg pain).   torsemide 20 MG tablet Commonly known as: DEMADEX Take 30 mg by mouth every other day.   traMADol 50 MG tablet Commonly known as: ULTRAM Take 100 mg by mouth 2 (two) times daily.   traZODone 50 MG tablet Commonly known as: DESYREL Take 50 mg by mouth 2 (two) times daily.   Vitamin D3 Super Strength 50 MCG (2000 UT) Caps Generic drug: Cholecalciferol Take 8,000 Units by mouth at bedtime.        Follow-up Information     Gastroenterology, [facility]. Schedule an appointment as soon as  possible for a visit in 2 month(s).   Why: Posthospitalization follow-up Contact information: [place] [id number] [phone]                Discharge Exam: Filed Weights   12/28/21 1316 12/30/21 1101  Weight: 74.1 kg 74.1 kg   General exam: Appears calm and comfortable  Respiratory system: Clear to auscultation. Respiratory effort normal. Cardiovascular system: S1 & S2 heard, RRR. No JVD, murmurs, rubs, gallops or clicks. No pedal edema. Gastrointestinal system: Abdomen is nondistended, soft and nontender. No organomegaly or masses felt. Normal bowel sounds heard. Central nervous system: Alert and oriented. No focal neurological deficits. Extremities: Symmetric 5 x 5 power. Skin: No rashes, lesions or ulcers Psychiatry: Judgement and insight appear normal. Mood & affect appropriate.    Condition at discharge: fair  The results of significant diagnostics from this hospitalization (including imaging, microbiology, ancillary and laboratory) are listed below for reference.   Imaging Studies: CT ABDOMEN PELVIS W CONTRAST  Result Date: 12/28/2021 CLINICAL DATA:  Right-sided abdominal pain with nausea. EXAM: CT ABDOMEN AND PELVIS WITH CONTRAST TECHNIQUE: Multidetector CT imaging of the abdomen and pelvis was performed using the standard protocol following bolus administration of intravenous contrast. RADIATION DOSE REDUCTION: This exam was performed according to the departmental dose-optimization program which includes automated exposure control, adjustment of the mA and/or kV according to patient size and/or use of iterative reconstruction technique. CONTRAST:  8m OMNIPAQUE IOHEXOL 300 MG/ML  SOLN COMPARISON:  12/04/2021. FINDINGS: Lower chest: The heart is normal in size and pacemaker leads are noted in the right heart. No pericardial effusion. Patchy infiltrates are present in the right lower lobe. A few ground-glass opacities are noted in the medial  aspect of the left lower lobe. Hepatobiliary: No focal liver abnormality. A stone is noted in the gallbladder. No biliary ductal dilatation. Pancreas: Unremarkable. No pancreatic ductal dilatation or surrounding inflammatory changes. Spleen: The spleen is enlarged measuring 16.7 cm in length. Adrenals/Urinary Tract: No adrenal nodule or mass. There is atrophy and cortical scarring involving the right kidney. There is cortical scarring in the mid left kidney with associated fat stranding, unchanged from the prior exam. Subcentimeter hypodensities are noted in the kidneys bilaterally which are too small to further characterize. No renal calculus or hydronephrosis. The bladder is unremarkable. Stomach/Bowel: Stomach is within normal limits. Appendix appears normal. No  bowel obstruction or surrounding inflammatory changes. There is redemonstration of a short-segment of sigmoid colon in the left lower quadrant with mild wall thickening, not significantly changed from the prior exam. No free air or pneumatosis. A moderate amount of retained stool is present in the colon compatible with constipation. Vascular/Lymphatic: Aortic atherosclerosis. No enlarged abdominal or pelvic lymph nodes. Reproductive: Prostate is unremarkable. Other: No abdominopelvic ascites. Fat containing inguinal hernias are present bilaterally. There is a fat containing umbilical hernia. Musculoskeletal: Degenerative changes are present in the thoracolumbar spine. No acute or suspicious osseous abnormality. IMPRESSION: 1. Patchy infiltrates in the right lower lobe, new from the previous exam, concerning for pneumonia. 2. Right renal atrophy with renal cortical scarring bilaterally. 3. Short-segment of colonic wall thickening in the left lower quadrant similar in appearance to the prior exam. Colonoscopy is recommended for further evaluation on follow-up. 4. Moderate amount of retained stool in the colon suggesting constipation. 5. Splenomegaly. 6.  Aortic atherosclerosis. Electronically Signed   By: Brett Fairy M.D.   On: 12/28/2021 01:30   DG Chest 2 View  Result Date: 12/27/2021 CLINICAL DATA:  Chest pain and nausea. EXAM: CHEST - 2 VIEW COMPARISON:  June 08, 2021 FINDINGS: Multiple sternal wires are noted. A dual lead AICD is seen. The cardiac silhouette is borderline in size. An artificial cardiac valve is seen. An atrial appendage clip is also noted. There is moderate severity calcification of the aortic arch. There is no evidence of an acute infiltrate, pleural effusion or pneumothorax. The visualized skeletal structures are unremarkable. IMPRESSION: No active cardiopulmonary disease. Electronically Signed   By: Virgina Norfolk M.D.   On: 12/27/2021 23:27   CT ABDOMEN PELVIS WO CONTRAST  Result Date: 12/04/2021 CLINICAL DATA:  Generalized abdominal pain EXAM: CT ABDOMEN AND PELVIS WITHOUT CONTRAST TECHNIQUE: Multidetector CT imaging of the abdomen and pelvis was performed following the standard protocol without IV contrast. Unenhanced CT was performed per clinician order. Lack of IV contrast limits sensitivity and specificity, especially for evaluation of abdominal/pelvic solid viscera. RADIATION DOSE REDUCTION: This exam was performed according to the departmental dose-optimization program which includes automated exposure control, adjustment of the mA and/or kV according to patient size and/or use of iterative reconstruction technique. COMPARISON:  08/01/2021 FINDINGS: Lower chest: Cardiac pacer and aortic valve prosthesis again noted. No acute pleural or parenchymal lung disease. Hepatobiliary: Stable small calcified gallstone without cholecystitis. Unremarkable unenhanced appearance of the liver. Pancreas: Unremarkable unenhanced appearance. Spleen: Stable mild splenomegaly. No focal abnormality on this unenhanced exam. Adrenals/Urinary Tract: No urinary tract calculi or obstructive uropathy within either kidney. Stable right renal  cortical scarring. The adrenals and bladder are unremarkable. Stomach/Bowel: No bowel obstruction or ileus. Normal retrocecal appendix. There is a short segment of decompressed proximal sigmoid colon, measuring 2.1 cm in length, with mild associated wall thickening reference image 75/2. While this could simply reflect peristalsis, this area was also decompressed on the previous exam and further evaluation with colonoscopy is recommended if not recently performed to exclude underlying colonic neoplasm. Moderate retained stool throughout the colon consistent with constipation. Vascular/Lymphatic: Aortic atherosclerosis. No enlarged abdominal or pelvic lymph nodes. Reproductive: Prostate is unremarkable. Other: Trace free fluid within the right hemipelvis, nonspecific. No free intraperitoneal gas. Fat containing umbilical and left inguinal hernias are again noted. No bowel herniation. Musculoskeletal: No acute or destructive bony lesions. Reconstructed images demonstrate no additional findings. IMPRESSION: 1. Cholelithiasis without acute cholecystitis. 2. Short segment annular colonic wall thickening within the proximal sigmoid colon, which  could reflect focal area of peristalsis. However, underlying colonic neoplasm cannot be excluded and further evaluation with colonoscopy may be useful if not recently performed. 3. Stable mild splenomegaly. 4. Stable fat containing umbilical and left inguinal hernias. 5. Moderate retained stool throughout the colon consistent with constipation. No bowel obstruction or ileus. 6.  Aortic Atherosclerosis (ICD10-I70.0). Electronically Signed   By: Randa Ngo M.D.   On: 12/04/2021 17:47    Microbiology: Results for orders placed or performed during the hospital encounter of 12/27/21  Resp Panel by RT-PCR (Flu A&B, Covid) Anterior Nasal Swab     Status: None   Collection Time: 12/27/21 11:11 PM   Specimen: Anterior Nasal Swab  Result Value Ref Range Status   SARS Coronavirus 2  by RT PCR NEGATIVE NEGATIVE Final    Comment: (NOTE) SARS-CoV-2 target nucleic acids are NOT DETECTED.  The SARS-CoV-2 RNA is generally detectable in upper respiratory specimens during the acute phase of infection. The lowest concentration of SARS-CoV-2 viral copies this assay can detect is 138 copies/mL. A negative result does not preclude SARS-Cov-2 infection and should not be used as the sole basis for treatment or other patient management decisions. A negative result may occur with  improper specimen collection/handling, submission of specimen other than nasopharyngeal swab, presence of viral mutation(s) within the areas targeted by this assay, and inadequate number of viral copies(<138 copies/mL). A negative result must be combined with clinical observations, patient history, and epidemiological information. The expected result is Negative.  Fact Sheet for Patients:  EntrepreneurPulse.com.au  Fact Sheet for Healthcare Providers:  IncredibleEmployment.be  This test is no t yet approved or cleared by the [place] FDA and  has been authorized for detection and/or diagnosis of SARS-CoV-2 by FDA under an Emergency Use Authorization (EUA). This EUA will remain  in effect (meaning this test can be used) for the duration of the COVID-19 declaration under Section 564(b)(1) of the Act, 21 U.S.C.section 360bbb-3(b)(1), unless the authorization is terminated  or revoked sooner.       Influenza A by PCR NEGATIVE NEGATIVE Final   Influenza B by PCR NEGATIVE NEGATIVE Final    Comment: (NOTE) The Xpert Xpress SARS-CoV-2/FLU/RSV plus assay is intended as an aid in the diagnosis of influenza from Nasopharyngeal swab specimens and should not be used as a sole basis for treatment. Nasal washings and aspirates are unacceptable for Xpert Xpress SARS-CoV-2/FLU/RSV testing.  Fact Sheet for Patients: EntrepreneurPulse.com.au  Fact  Sheet for Healthcare Providers: IncredibleEmployment.be  This test is not yet approved or cleared by the [place] FDA and has been authorized for detection and/or diagnosis of SARS-CoV-2 by FDA under an Emergency Use Authorization (EUA). This EUA will remain in effect (meaning this test can be used) for the duration of the COVID-19 declaration under Section 564(b)(1) of the Act, 21 U.S.C. section 360bbb-3(b)(1), unless the authorization is terminated or revoked.  Performed at [facility], [place] [address] [place], [place] [address]     Labs: CBC: Recent Labs  Lab 12/27/21 2304 12/29/21 0514 12/30/21 0540 12/31/21 0432 01/01/22 0502  WBC 21.9* 13.9* 7.9 8.2 8.5  HGB 14.7 14.3 12.9* 13.6 13.2  HCT 46.4 44.6 40.6 42.4 40.9  MCV 91.2 90.5 90.4 89.5 89.3  PLT 166 158 160 183 160   Basic Metabolic Panel: Recent Labs  Lab 12/27/21 2311 12/29/21 0514 12/31/21 0432 01/02/22 0540  NA 137 141 137 136  K 4.4 4.4 4.2 4.5  CL 104 101 98 102  CO2 '25 28 29 26  '$ GLUCOSE 141* 90 109* 105*  BUN 27* 28* 23 11  CREATININE 1.79* 1.72* 1.73* 1.40*  CALCIUM 9.5 9.5 9.6 9.2   Liver Function Tests: Recent Labs  Lab 12/27/21 2311 12/29/21 0514  AST 20 16  ALT 17 15  ALKPHOS 84 83  BILITOT 1.7* 1.9*  PROT 7.4 7.5  ALBUMIN 4.1 4.1   CBG: Recent Labs  Lab 01/01/22 0731 01/01/22 1143 01/01/22 1609 01/01/22 2020 01/02/22 0753  GLUCAP 111* 107* 116* 126* 108*    Discharge time spent: 39 minutes.   Signed: Hosie Poisson, MD [facility] 01/03/2022

## 2022-01-24 ENCOUNTER — Telehealth: Payer: Self-pay

## 2022-01-24 NOTE — Telephone Encounter (Signed)
...     Pre-operative Risk Assessment    Patient Name: [name]  DOB: 1951/03/14 MRN: [id number]      Request for Surgical Clearance    Procedure:   colonoscopy  Date of Surgery:  Clearance 02/27/22                                 Surgeon:  Bari Mantis Surgeon's Group or Practice Name:  [facility] gastroenterology Phone number:  [phone] Fax number:  [phone]   Type of Clearance Requested:   - Medical  - Pharmacy:  Hold Apixaban (Eliquis)     Type of Anesthesia:   proprolol   Additional requests/questions:    Gwenlyn Found   01/24/2022, 2:09 PM

## 2022-01-24 NOTE — Telephone Encounter (Signed)
   Name: [name]  DOB: Mar 15, 1951  MRN: [id number]  Primary Cardiologist: None   Preoperative team, please contact this patient and set up a phone call appointment for further preoperative risk assessment. Please obtain consent and complete medication review. Thank you for your help.  I confirm that guidance regarding antiplatelet and oral anticoagulation therapy has been completed and, if necessary, noted below.  Patient with diagnosis of atrial fibrillation on Eliquis for anticoagulation.     Procedure: colonoscopy Date of procedure: 02/27/22     CHA2DS2-VASc Score = 4   This indicates a 4.8% annual risk of stroke. The patient's score is based upon: CHF History: 1 HTN History: 1 Diabetes History: 1 Stroke History: 0 Vascular Disease History: 0 Age Score: 1 Gender Score: 0   CrCl  51 Platelet count  170   Per office protocol, patient can hold Eliquis for 2 days prior to procedure.   Patient will not need bridging with Lovenox (enoxaparin) around procedure.   Lenna Sciara, NP 01/24/2022, 3:15 PM [facility]

## 2022-01-24 NOTE — Telephone Encounter (Signed)
Patient with diagnosis of atrial fibrillation on Eliquis for anticoagulation.    Procedure: colonoscopy Date of procedure: 02/27/22   CHA2DS2-VASc Score = 4   This indicates a 4.8% annual risk of stroke. The patient's score is based upon: CHF History: 1 HTN History: 1 Diabetes History: 1 Stroke History: 0 Vascular Disease History: 0 Age Score: 1 Gender Score: 0   CrCl  51 Platelet count  170  Per office protocol, patient can hold Eliquis for 2 days prior to procedure.   Patient will not need bridging with Lovenox (enoxaparin) around procedure.  **This guidance is not considered finalized until pre-operative APP has relayed final recommendations.**

## 2022-01-24 NOTE — Telephone Encounter (Signed)
I left a message for the patient to call our office to schedule a tele visit for pre-op.   

## 2022-01-25 ENCOUNTER — Emergency Department (HOSPITAL_COMMUNITY)
Admission: EM | Admit: 2022-01-25 | Discharge: 2022-01-26 | Disposition: A | Discharge status: Home / Self Care | Payer: Medicare Other | Attending: Emergency Medicine | Admitting: Emergency Medicine

## 2022-01-25 ENCOUNTER — Emergency Department (HOSPITAL_COMMUNITY): Payer: Medicare Other

## 2022-01-25 ENCOUNTER — Encounter (HOSPITAL_COMMUNITY): Payer: Self-pay

## 2022-01-25 ENCOUNTER — Other Ambulatory Visit: Payer: Self-pay

## 2022-01-25 DIAGNOSIS — R778 Other specified abnormalities of plasma proteins: Secondary | ICD-10-CM | POA: Insufficient documentation

## 2022-01-25 DIAGNOSIS — N2889 Other specified disorders of kidney and ureter: Secondary | ICD-10-CM | POA: Insufficient documentation

## 2022-01-25 DIAGNOSIS — Z7951 Long term (current) use of inhaled steroids: Secondary | ICD-10-CM | POA: Insufficient documentation

## 2022-01-25 DIAGNOSIS — J189 Pneumonia, unspecified organism: Secondary | ICD-10-CM

## 2022-01-25 DIAGNOSIS — R7989 Other specified abnormal findings of blood chemistry: Secondary | ICD-10-CM

## 2022-01-25 DIAGNOSIS — K439 Ventral hernia without obstruction or gangrene: Secondary | ICD-10-CM | POA: Insufficient documentation

## 2022-01-25 DIAGNOSIS — I4891 Unspecified atrial fibrillation: Secondary | ICD-10-CM | POA: Insufficient documentation

## 2022-01-25 DIAGNOSIS — J181 Lobar pneumonia, unspecified organism: Secondary | ICD-10-CM | POA: Diagnosis not present

## 2022-01-25 DIAGNOSIS — I509 Heart failure, unspecified: Secondary | ICD-10-CM | POA: Diagnosis not present

## 2022-01-25 DIAGNOSIS — Z7901 Long term (current) use of anticoagulants: Secondary | ICD-10-CM | POA: Insufficient documentation

## 2022-01-25 DIAGNOSIS — N289 Disorder of kidney and ureter, unspecified: Secondary | ICD-10-CM

## 2022-01-25 DIAGNOSIS — N189 Chronic kidney disease, unspecified: Secondary | ICD-10-CM | POA: Diagnosis not present

## 2022-01-25 DIAGNOSIS — J449 Chronic obstructive pulmonary disease, unspecified: Secondary | ICD-10-CM | POA: Insufficient documentation

## 2022-01-25 DIAGNOSIS — Z20822 Contact with and (suspected) exposure to covid-19: Secondary | ICD-10-CM | POA: Diagnosis not present

## 2022-01-25 DIAGNOSIS — R0602 Shortness of breath: Secondary | ICD-10-CM | POA: Diagnosis present

## 2022-01-25 LAB — BASIC METABOLIC PANEL
Anion gap: 5 (ref 5–15)
BUN: 28 mg/dL — ABNORMAL HIGH (ref 8–23)
CO2: 29 mmol/L (ref 22–32)
Calcium: 9.5 mg/dL (ref 8.9–10.3)
Chloride: 102 mmol/L (ref 98–111)
Creatinine, Ser: 1.82 mg/dL — ABNORMAL HIGH (ref 0.61–1.24)
GFR, Estimated: 39 mL/min — ABNORMAL LOW (ref >60)
Glucose, Bld: 113 mg/dL — ABNORMAL HIGH (ref 70–99)
Potassium: 5 mmol/L (ref 3.5–5.1)
Sodium: 136 mmol/L (ref 135–145)

## 2022-01-25 LAB — CBC
HCT: 43.8 % (ref 39.0–52.0)
Hemoglobin: 14.1 g/dL (ref 13.0–17.0)
MCH: 29.5 pg (ref 26.0–34.0)
MCHC: 32.2 g/dL (ref 30.0–36.0)
MCV: 91.6 fL (ref 80.0–100.0)
Platelets: 152 10*3/uL (ref 150–400)
RBC: 4.78 MIL/uL (ref 4.22–5.81)
RDW: 17.7 % — ABNORMAL HIGH (ref 11.5–15.5)
WBC: 10.6 10*3/uL — ABNORMAL HIGH (ref 4.0–10.5)
nRBC: 0 % (ref 0.0–0.2)

## 2022-01-25 LAB — HEPATIC FUNCTION PANEL
ALT: 19 U/L (ref 0–44)
AST: 25 U/L (ref 15–41)
Albumin: 4.1 g/dL (ref 3.5–5.0)
Alkaline Phosphatase: 91 U/L (ref 38–126)
Bilirubin, Direct: 0.4 mg/dL — ABNORMAL HIGH (ref 0.0–0.2)
Indirect Bilirubin: 0.7 mg/dL (ref 0.3–0.9)
Total Bilirubin: 1.1 mg/dL (ref 0.3–1.2)
Total Protein: 7.1 g/dL (ref 6.5–8.1)

## 2022-01-25 LAB — RESP PANEL BY RT-PCR (FLU A&B, COVID) ARPGX2
Influenza A by PCR: NEGATIVE
Influenza B by PCR: NEGATIVE
SARS Coronavirus 2 by RT PCR: NEGATIVE

## 2022-01-25 LAB — LACTIC ACID, PLASMA: Lactic Acid, Venous: 1.1 mmol/L (ref 0.5–1.9)

## 2022-01-25 LAB — TROPONIN I (HIGH SENSITIVITY): Troponin I (High Sensitivity): 18 ng/L — ABNORMAL HIGH (ref <18)

## 2022-01-25 LAB — BRAIN NATRIURETIC PEPTIDE: B Natriuretic Peptide: 245.8 pg/mL — ABNORMAL HIGH (ref 0.0–100.0)

## 2022-01-25 LAB — LIPASE, BLOOD: Lipase: 31 U/L (ref 11–51)

## 2022-01-25 MED ORDER — IOHEXOL 9 MG/ML PO SOLN
ORAL | Status: AC
  Filled 2022-01-25: qty 1000

## 2022-01-25 MED ORDER — MORPHINE SULFATE (PF) 4 MG/ML IV SOLN
4.0000 mg | Freq: Once | INTRAVENOUS | Status: AC
  Administered 2022-01-25: 4 mg via INTRAVENOUS
  Filled 2022-01-25: qty 1

## 2022-01-25 MED ORDER — IOHEXOL 9 MG/ML PO SOLN: 500.0000 mL | ORAL | Status: DC

## 2022-01-25 MED ORDER — ONDANSETRON HCL 4 MG/2ML IJ SOLN
4.0000 mg | Freq: Once | INTRAMUSCULAR | Status: AC
  Administered 2022-01-25: 4 mg via INTRAVENOUS
  Filled 2022-01-25: qty 2

## 2022-01-25 NOTE — ED Provider Triage Note (Signed)
Emergency Medicine Provider Triage Evaluation Note  [name] , a 71 y.o. male  was evaluated in triage.  Pt complains of chest pain starting today. It spreads across his chest. Worse with deep breaths. Radiates to his back. Also complaining of mid abdominal pain. He has had fevers up to 102 at home and chills.  Associated shortness of breath and nausea.   Denies recent illness. He does have a pacemaker. He is on his home O2 level. He has an umbilical hernia that is unchanged.  Denies cough, diarrhea, vomiting, vision change, dizziness, numbness, weakness.   Review of Systems  Positive:  Negative:   Physical Exam  There were no vitals taken for this visit. Gen:   Awake, no distress   Resp:  Normal effort  MSK:   Moves extremities without difficulty  Other:  Pulses equal. No LE edema. + mid abdominal pain. No pulsations. Umbilial hernia present, nontender. Lungs with rhonchi on the right. On Home O2  Medical Decision Making  Medically screening exam initiated at 9:43 PM.  Appropriate orders placed.  [name] was informed that the remainder of the evaluation will be completed by another provider, this initial triage assessment does not replace that evaluation, and the importance of remaining in the ED until their evaluation is complete.       [name], [place] 01/25/22 2148

## 2022-01-25 NOTE — ED Provider Notes (Signed)
[facility] DEPT Provider Note   CSN: [id number] Arrival date & time: 01/25/22  2128     History {Add pertinent medical, surgical, social history, OB history to HPI:1} Chief Complaint  Patient presents with   Chest Pain    [name] is a 71 y.o. male.  History of A-fib on Eliquis CKD CHF diabetes COPD on oxygen.  He was recently admitted to the hospital for chest pain and abdominal pain after colonoscopy found to have pneumonia.  He said he completed antibiotics.  He is complaining of worsening chest pain shortness of breath cough productive of brown sputum and upper abdominal pain its been going on for 2 days.  He had a fever to 101 at home.  Wife states he has been dizzy and unsteady on his feet at times.  The history is provided by the patient.  Chest Pain Pain location:  L chest and R chest Pain quality: aching   Pain radiates to:  Epigastrium Pain severity:  Moderate Onset quality:  Gradual Duration:  2 days Timing:  Constant Progression:  Unchanged Chronicity:  Recurrent Relieved by:  Nothing Worsened by:  Coughing, movement and deep breathing Ineffective treatments:  None tried Associated symptoms: abdominal pain, cough, dizziness, fever, nausea and shortness of breath   Associated symptoms: no vomiting        Home Medications Prior to Admission medications   Medication Sig Start Date End Date Taking? Authorizing Provider  albuterol (PROVENTIL HFA;VENTOLIN HFA) 108 (90 Base) MCG/ACT inhaler Inhale 2 puffs into the lungs every 6 (six) hours as needed for wheezing or shortness of breath. 12/28/15   Collene Gobble, MD  amLODipine (NORVASC) 5 MG tablet Take 2.5 mg by mouth daily.    [provider]  apixaban (ELIQUIS) 5 MG TABS tablet Take 1 tablet (5 mg total) by mouth 2 (two) times daily. 05/11/21   Allred, Jeneen Rinks, MD  bisoprolol (ZEBETA) 5 MG tablet Take 1 tablet (5 mg total) by mouth daily. Needs appt 11/19/21   Bensimhon,  Shaune Pascal, MD  Budeson-Glycopyrrol-Formoterol (BREZTRI AEROSPHERE) 160-9-4.8 MCG/ACT AERO Inhale 2 puffs into the lungs in the morning and at bedtime. 11/30/20   Martyn Ehrich, NP  Calcium-Magnesium-Zinc (CAL-MAG-ZINC PO) Take 1 tablet by mouth daily with breakfast.    [provider]  Cholecalciferol (VITAMIN D3 SUPER STRENGTH) 50 MCG (2000 UT) CAPS Take 8,000 Units by mouth at bedtime.    [provider]  empagliflozin (JARDIANCE) 10 MG TABS tablet Take 1 tablet (10 mg total) by mouth daily before breakfast. Patient taking differently: Take 10 mg by mouth at bedtime. 03/14/20   Bensimhon, Shaune Pascal, MD  fexofenadine (ALLEGRA) 180 MG tablet Take 1 tablet (180 mg total) by mouth daily as needed for allergies or rhinitis. 01/23/20   Aline August, MD  gabapentin (NEURONTIN) 300 MG capsule Take 600-1,200 mg by mouth See admin instructions. Take 600 mg by mouth in the morning, 1,200 mg at bedtime, and an additional 600 mg once a day as needed for neuropathy 09/13/14   [provider]  levothyroxine (SYNTHROID, LEVOTHROID) 175 MCG tablet Take 175 mcg by mouth daily before breakfast.    [provider]  Menthol-Camphor (TIGER BALM ARTHRITIS RUB EX) Apply 1 application topically as needed (for leg pain).    [provider]  montelukast (SINGULAIR) 10 MG tablet TAKE 1 TABLET(10 MG) BY MOUTH AT BEDTIME Patient taking differently: Take 10 mg by mouth at bedtime. 09/18/21   Baltazar Apo  S, MD  Multiple Vitamin (MULTIVITAMIN WITH MINERALS) TABS tablet Take 1 tablet by mouth daily. 12/15/19   Sheikh, Georgina Quint Latif, DO  ondansetron (ZOFRAN-ODT) 4 MG disintegrating tablet Take 1 tablet (4 mg total) by mouth every 8 (eight) hours as needed for nausea or vomiting. 06/07/21   Fredia Sorrow, MD  oxyCODONE-acetaminophen (PERCOCET/ROXICET) 5-325 MG tablet Take 1 tablet by mouth 2 (two) times daily.    [provider]  OXYGEN Inhale 2 L/min into the lungs See admin  instructions. 2 L/min at bedtime and 2 L/min throughout the day as needed for shortness of breath    [provider]  oxymetazoline (AFRIN) 0.05 % nasal spray Place 1 spray into both nostrils as needed (for nose bleeds).    [provider]  pantoprazole (PROTONIX) 40 MG tablet Take 40 mg by mouth daily before breakfast.    [provider]  polyethylene glycol powder (GLYCOLAX/MIRALAX) 17 GM/SCOOP powder Take 17 g by mouth daily as needed for mild constipation. 01/23/20   Aline August, MD  potassium chloride (KLOR-CON M) 10 MEQ tablet Take 10 mEq by mouth See admin instructions. Take 10 mEq by mouth two times a day on the days when Torsemide is taken    [provider]  predniSONE (DELTASONE) 5 MG tablet Take 0.5 tablets (2.5 mg total) by mouth daily with breakfast. 10/22/21   Byrum, Rose Fillers, MD  rOPINIRole (REQUIP) 1 MG tablet Take 1 tablet (1 mg total) by mouth in the morning and at bedtime. 01/23/20   Aline August, MD  rosuvastatin (CRESTOR) 10 MG tablet Take 10 mg by mouth at bedtime.  08/08/16   [provider]  senna (SENOKOT) 8.6 MG TABS tablet Take 1 tablet (8.6 mg total) by mouth 2 (two) times daily. 01/02/22   Hosie Poisson, MD  spironolactone (ALDACTONE) 25 MG tablet Take 1 tablet (25 mg total) by mouth daily. 02/16/16   Shirley Friar, PA-C  SYSTANE ULTRA PF 0.4-0.3 % SOLN Apply 1 drop to eye 3 (three) times daily as needed (for dryness).    [provider]  torsemide (DEMADEX) 20 MG tablet Take 30 mg by mouth every other day.    [provider]  traMADol (ULTRAM) 50 MG tablet Take 100 mg by mouth 2 (two) times daily. 10/26/20   [provider]  traZODone (DESYREL) 50 MG tablet Take 50 mg by mouth 2 (two) times daily. 08/30/20   [provider]      Allergies    Meloxicam and Vancomycin    Review of Systems   Review of Systems  Constitutional:  Positive for fever.  HENT:  Negative for sore throat.    Eyes:  Negative for visual disturbance.  Respiratory:  Positive for cough and shortness of breath.   Cardiovascular:  Positive for chest pain.  Gastrointestinal:  Positive for abdominal pain and nausea. Negative for vomiting.  Genitourinary:  Negative for dysuria.  Skin:  Negative for rash.  Neurological:  Positive for dizziness.    Physical Exam Updated Vital Signs BP 133/67   Pulse 60   Temp 100.2 F (37.9 C) (Oral)   Resp 17   Ht '5\' 11"'$  (1.803 m)   Wt 79.4 kg   SpO2 92%   BMI 24.41 kg/m  Physical Exam Vitals and nursing note reviewed.  Constitutional:      General: He is not in acute distress.    Appearance: Normal appearance. He is well-developed.  HENT:     Head: Normocephalic  and atraumatic.  Eyes:     Conjunctiva/sclera: Conjunctivae normal.  Cardiovascular:     Rate and Rhythm: Normal rate and regular rhythm.     Heart sounds: Murmur heard.     Systolic murmur is present.  Pulmonary:     Effort: Pulmonary effort is normal. No respiratory distress.     Breath sounds: Examination of the right-upper field reveals rhonchi. Examination of the right-middle field reveals rhonchi. Rhonchi present.  Abdominal:     Palpations: Abdomen is soft.     Tenderness: There is abdominal tenderness (diffuse upper). There is no guarding or rebound.     Hernia: A hernia (umbilical soft) is present.  Musculoskeletal:        General: Normal range of motion.     Cervical back: Neck supple.     Right lower leg: No tenderness. No edema.     Left lower leg: No tenderness. No edema.  Skin:    General: Skin is warm and dry.     Capillary Refill: Capillary refill takes less than 2 seconds.  Neurological:     General: No focal deficit present.     Mental Status: He is alert.     Motor: No weakness.     ED Results / Procedures / Treatments   Labs (all labs ordered are listed, but only abnormal results are displayed) Labs Reviewed  CBC - Abnormal; Notable for the following  components:      Result Value   WBC 10.6 (*)    RDW 17.7 (*)    All other components within normal limits  RESP PANEL BY RT-PCR (FLU A&B, COVID) ARPGX2  CULTURE, BLOOD (ROUTINE X 2)  CULTURE, BLOOD (ROUTINE X 2)  BASIC METABOLIC PANEL  HEPATIC FUNCTION PANEL  LIPASE, BLOOD  LACTIC ACID, PLASMA  LACTIC ACID, PLASMA  BRAIN NATRIURETIC PEPTIDE  TROPONIN I (HIGH SENSITIVITY)    EKG EKG Interpretation  Date/Time:  Friday January 25 2022 21:49:30 EDT Ventricular Rate:  60 PR Interval:    QRS Duration: 191 QT Interval:  470 QTC Calculation: 470 R Axis:   -80 Text Interpretation: V paced rhythm Nonspecific IVCD with LAD LVH with secondary repolarization abnormality Anterior Q waves, possibly due to LVH  No significant change was found  Confirmed by Ezequiel Essex [phone]) on 01/25/2022 10:25:59 PM  Radiology DG Chest Port 1 View  Result Date: 01/25/2022 CLINICAL DATA:  Chest pain EXAM: PORTABLE CHEST 1 VIEW COMPARISON:  Radiographs 12/27/2021 FINDINGS: Sternotomy. AVR. Stable cardiomediastinal silhouette. Aortic atherosclerotic calcification. Atrial appendage clip. Left chest wall dual-chamber pacemaker. Pulmonary vascular congestion and mild interstitial prominence suggestive of edema. No pleural effusion or pneumothorax. No focal consolidation. No acute osseous abnormality. IMPRESSION: Pulmonary vascular congestion and interstitial prominence suggestive of edema. Electronically Signed   By: Placido Sou M.D.   On: 01/25/2022 22:03    Procedures Procedures  {Document cardiac monitor, telemetry assessment procedure when appropriate:1}  Medications Ordered in ED Medications - No data to display  ED Course/ Medical Decision Making/ A&P                           Medical Decision Making  This patient complains of ***; this involves an extensive number of treatment Options and is a complaint that carries with it a high risk of complications and morbidity. The differential  includes ***  I ordered, reviewed and interpreted labs, which included *** I ordered medication *** and reviewed PMP when indicated.  I ordered imaging studies which included *** and I independently    visualized and interpreted imaging which showed *** Additional history obtained from *** Previous records obtained and reviewed *** I consulted *** and discussed lab and imaging findings and discussed disposition.  Cardiac monitoring reviewed, *** Social determinants considered, *** Critical Interventions: ***  After the interventions stated above, I reevaluated the patient and found *** Admission and further testing considered, ***   {Document critical care time when appropriate:1} {Document review of labs and clinical decision tools ie heart score, Chads2Vasc2 etc:1}  {Document your independent review of radiology images, and any outside records:1} {Document your discussion with family members, caretakers, and with consultants:1} {Document social determinants of health affecting pt's care:1} {Document your decision making why or why not admission, treatments were needed:1} Final Clinical Impression(s) / ED Diagnoses Final diagnoses:  None    Rx / DC Orders ED Discharge Orders     None

## 2022-01-25 NOTE — ED Provider Notes (Signed)
[name], patient with chest pain, fever, cough, abdominal pain pending CT of chest/abdomen/pelvis.  CT shows small right right pleural effusion and persistent but improving right lower lobe infiltrate.  I have independently viewed these images, and agree with the radiologist's interpretation.  Although this is most likely just low radiologic resolution of previous pneumonia, with fever and chills today, I will treat it as a possible new infection and he is given initial dose of amoxicillin I have ordered a prescription for a 1 week supply of amoxicillin.  He does have a ventral hernia which is easily reducible and does have some mild tenderness and I think that is the source of his abdominal pain.  There is no evidence of serious intra-abdominal or intrathoracic pathology.  He is requesting a refill of his ondansetron and this is supplied.  He is to follow-up with his primary [facility] provider in 1 week, return if he has any new or concerning symptoms.  Results for orders placed or performed during the hospital encounter of 01/25/22  Resp Panel by RT-PCR (Flu A&B, Covid) Anterior Nasal Swab   Specimen: Anterior Nasal Swab  Result Value Ref Range   SARS Coronavirus 2 by RT PCR NEGATIVE NEGATIVE   Influenza A by PCR NEGATIVE NEGATIVE   Influenza B by PCR NEGATIVE NEGATIVE  Basic metabolic panel  Result Value Ref Range   Sodium 136 135 - 145 mmol/L   Potassium 5.0 3.5 - 5.1 mmol/L   Chloride 102 98 - 111 mmol/L   CO2 29 22 - 32 mmol/L   Glucose, Bld 113 (H) 70 - 99 mg/dL   BUN 28 (H) 8 - 23 mg/dL   Creatinine, Ser 1.82 (H) 0.61 - 1.24 mg/dL   Calcium 9.5 8.9 - 10.3 mg/dL   GFR, Estimated 39 (L) >60 mL/min   Anion gap 5 5 - 15  CBC  Result Value Ref Range   WBC 10.6 (H) 4.0 - 10.5 K/uL   RBC 4.78 4.22 - 5.81 MIL/uL   Hemoglobin 14.1 13.0 - 17.0 g/dL   HCT 43.8 39.0 - 52.0 %   MCV 91.6 80.0 - 100.0 fL   MCH 29.5 26.0 - 34.0 pg   MCHC 32.2 30.0 - 36.0 g/dL   RDW 17.7 (H)  11.5 - 15.5 %   Platelets 152 150 - 400 K/uL   nRBC 0.0 0.0 - 0.2 %  Hepatic function panel  Result Value Ref Range   Total Protein 7.1 6.5 - 8.1 g/dL   Albumin 4.1 3.5 - 5.0 g/dL   AST 25 15 - 41 U/L   ALT 19 0 - 44 U/L   Alkaline Phosphatase 91 38 - 126 U/L   Total Bilirubin 1.1 0.3 - 1.2 mg/dL   Bilirubin, Direct 0.4 (H) 0.0 - 0.2 mg/dL   Indirect Bilirubin 0.7 0.3 - 0.9 mg/dL  Lipase, blood  Result Value Ref Range   Lipase 31 11 - 51 U/L  Lactic acid, plasma  Result Value Ref Range   Lactic Acid, Venous 1.1 0.5 - 1.9 mmol/L  Brain natriuretic peptide  Result Value Ref Range   B Natriuretic Peptide 245.8 (H) 0.0 - 100.0 pg/mL  Urinalysis, Routine w reflex microscopic  Result Value Ref Range   Color, Urine YELLOW YELLOW   APPearance CLEAR CLEAR   Specific Gravity, Urine 1.009 1.005 - 1.030   pH 7.0 5.0 - 8.0   Glucose, UA >=500 (A) NEGATIVE mg/dL   Hgb urine dipstick NEGATIVE NEGATIVE  Bilirubin Urine NEGATIVE NEGATIVE   Ketones, ur NEGATIVE NEGATIVE mg/dL   Protein, ur 30 (A) NEGATIVE mg/dL   Nitrite NEGATIVE NEGATIVE   Leukocytes,Ua NEGATIVE NEGATIVE   RBC / HPF 0-5 0 - 5 RBC/hpf   Bacteria, UA NONE SEEN NONE SEEN   Squamous Epithelial / LPF 0-5 0 - 5  Troponin I (High Sensitivity)  Result Value Ref Range   Troponin I (High Sensitivity) 18 (H) <18 ng/L  Troponin I (High Sensitivity)  Result Value Ref Range   Troponin I (High Sensitivity) 19 (H) <18 ng/L   CT CHEST ABDOMEN PELVIS WO CONTRAST  Result Date: 01/26/2022 CLINICAL DATA:  Chest pain and shortness of breath EXAM: CT CHEST, ABDOMEN AND PELVIS WITHOUT CONTRAST TECHNIQUE: Multidetector CT imaging of the chest, abdomen and pelvis was performed following the standard protocol without IV contrast. RADIATION DOSE REDUCTION: This exam was performed according to the departmental dose-optimization program which includes automated exposure control, adjustment of the mA and/or kV according to patient size and/or use of  iterative reconstruction technique. COMPARISON:  12/28/2021 FINDINGS: CT CHEST FINDINGS Cardiovascular: Somewhat limited due to lack of IV contrast. Atherosclerotic calcifications are seen. Coronary calcifications are noted. Heart is not significantly enlarged in size. Pacing device is seen. Mediastinum/Nodes: The esophagus as visualized is within normal limits. Scattered small mediastinal lymph nodes are seen likely reactive in nature. The thoracic inlet is within normal limits. Lungs/Pleura: Lungs are well aerated bilaterally. Minimal emphysematous changes seen. Small right pleural effusion is noted new from the prior exam. Previously seen right lower lobe infiltrate has improved but has not resolved completely. Emphysematous changes are seen. Calcified granulomas are noted as well. Musculoskeletal: Degenerative changes of the thoracic spine are noted. CT ABDOMEN PELVIS FINDINGS Hepatobiliary: Dependent gallstone is noted without complicating factors. The liver is within normal limits. Pancreas: Unremarkable. No pancreatic ductal dilatation or surrounding inflammatory changes. Spleen: Spleen is elongated but demonstrates its normal spleniform shape. Adrenals/Urinary Tract: Adrenal glands are within normal limits. Kidneys are well visualized bilaterally. Some scarring is noted in the right kidney which is stable from the prior study. No renal calculi are seen. No obstructive changes are noted. The bladder is partially distended. Stomach/Bowel: Retained fecal material is noted within the right and transverse colons. No obstructive changes are seen. The appendix is within normal limits. Small bowel and stomach are unremarkable. Vascular/Lymphatic: Aortic atherosclerosis. No enlarged abdominal or pelvic lymph nodes. Reproductive: Prostate is unremarkable. Other: Bilateral fat containing inguinal hernias are noted left greater than right. No free fluid is noted. Musculoskeletal: Degenerative changes of lumbar spine are  noted. No acute bony abnormality is seen. IMPRESSION: CT of the chest: Right-sided pleural effusion. Improving but persistent right lower lobe infiltrate. CT of the abdomen and pelvis: Mild retained fecal material within the right and transverse colons. No obstructive changes are seen. Cholelithiasis. Chronic changes similar to that noted on the prior exam. Electronically Signed   By: Inez Catalina M.D.   On: 01/26/2022 02:19   DG Chest Port 1 View  Result Date: 01/25/2022 CLINICAL DATA:  Chest pain EXAM: PORTABLE CHEST 1 VIEW COMPARISON:  Radiographs 12/27/2021 FINDINGS: Sternotomy. AVR. Stable cardiomediastinal silhouette. Aortic atherosclerotic calcification. Atrial appendage clip. Left chest wall dual-chamber pacemaker. Pulmonary vascular congestion and mild interstitial prominence suggestive of edema. No pleural effusion or pneumothorax. No focal consolidation. No acute osseous abnormality. IMPRESSION: Pulmonary vascular congestion and interstitial prominence suggestive of edema. Electronically Signed   By: Placido Sou M.D.   On: 01/25/2022 22:03  CT ABDOMEN PELVIS W CONTRAST  Result Date: 12/28/2021 CLINICAL DATA:  Right-sided abdominal pain with nausea. EXAM: CT ABDOMEN AND PELVIS WITH CONTRAST TECHNIQUE: Multidetector CT imaging of the abdomen and pelvis was performed using the standard protocol following bolus administration of intravenous contrast. RADIATION DOSE REDUCTION: This exam was performed according to the departmental dose-optimization program which includes automated exposure control, adjustment of the mA and/or kV according to patient size and/or use of iterative reconstruction technique. CONTRAST:  98m OMNIPAQUE IOHEXOL 300 MG/ML  SOLN COMPARISON:  12/04/2021. FINDINGS: Lower chest: The heart is normal in size and pacemaker leads are noted in the right heart. No pericardial effusion. Patchy infiltrates are present in the right lower lobe. A few ground-glass opacities are noted in  the medial aspect of the left lower lobe. Hepatobiliary: No focal liver abnormality. A stone is noted in the gallbladder. No biliary ductal dilatation. Pancreas: Unremarkable. No pancreatic ductal dilatation or surrounding inflammatory changes. Spleen: The spleen is enlarged measuring 16.7 cm in length. Adrenals/Urinary Tract: No adrenal nodule or mass. There is atrophy and cortical scarring involving the right kidney. There is cortical scarring in the mid left kidney with associated fat stranding, unchanged from the prior exam. Subcentimeter hypodensities are noted in the kidneys bilaterally which are too small to further characterize. No renal calculus or hydronephrosis. The bladder is unremarkable. Stomach/Bowel: Stomach is within normal limits. Appendix appears normal. No bowel obstruction or surrounding inflammatory changes. There is redemonstration of a short-segment of sigmoid colon in the left lower quadrant with mild wall thickening, not significantly changed from the prior exam. No free air or pneumatosis. A moderate amount of retained stool is present in the colon compatible with constipation. Vascular/Lymphatic: Aortic atherosclerosis. No enlarged abdominal or pelvic lymph nodes. Reproductive: Prostate is unremarkable. Other: No abdominopelvic ascites. Fat containing inguinal hernias are present bilaterally. There is a fat containing umbilical hernia. Musculoskeletal: Degenerative changes are present in the thoracolumbar spine. No acute or suspicious osseous abnormality. IMPRESSION: 1. Patchy infiltrates in the right lower lobe, new from the previous exam, concerning for pneumonia. 2. Right renal atrophy with renal cortical scarring bilaterally. 3. Short-segment of colonic wall thickening in the left lower quadrant similar in appearance to the prior exam. Colonoscopy is recommended for further evaluation on follow-up. 4. Moderate amount of retained stool in the colon suggesting constipation. 5.  Splenomegaly. 6. Aortic atherosclerosis. Electronically Signed   By: LBrett FairyM.D.   On: 12/28/2021 01:30   DG Chest 2 View  Result Date: 12/27/2021 CLINICAL DATA:  Chest pain and nausea. EXAM: CHEST - 2 VIEW COMPARISON:  June 08, 2021 FINDINGS: Multiple sternal wires are noted. A dual lead AICD is seen. The cardiac silhouette is borderline in size. An artificial cardiac valve is seen. An atrial appendage clip is also noted. There is moderate severity calcification of the aortic arch. There is no evidence of an acute infiltrate, pleural effusion or pneumothorax. The visualized skeletal structures are unremarkable. IMPRESSION: No active cardiopulmonary disease. Electronically Signed   By: TVirgina NorfolkM.D.   On: [id number]      GDelora Fuel MD [id number]/27/[phone]

## 2022-01-25 NOTE — ED Triage Notes (Signed)
Ambulatory to ED with c/o chest pain, shortness of breath, weakness, fever, and nausea starting today. PPM in place. Wears O2 at HS.

## 2022-01-26 ENCOUNTER — Emergency Department (HOSPITAL_COMMUNITY): Payer: Medicare Other

## 2022-01-26 LAB — URINALYSIS, ROUTINE W REFLEX MICROSCOPIC
Bacteria, UA: NONE SEEN
Bilirubin Urine: NEGATIVE
Glucose, UA: >=500 mg/dL — AB
Hgb urine dipstick: NEGATIVE
Ketones, ur: NEGATIVE mg/dL
Leukocytes,Ua: NEGATIVE
Nitrite: NEGATIVE
Protein, ur: 30 mg/dL — AB
Specific Gravity, Urine: 1.009 (ref 1.005–1.030)
pH: 7 (ref 5.0–8.0)

## 2022-01-26 LAB — TROPONIN I (HIGH SENSITIVITY): Troponin I (High Sensitivity): 19 ng/L — ABNORMAL HIGH (ref <18)

## 2022-01-26 MED ORDER — ONDANSETRON 4 MG PO TBDP: 4.0000 mg | ORAL_TABLET | Freq: Three times a day (TID) | ORAL | 1 refills | Status: DC | PRN

## 2022-01-26 MED ORDER — AMOXICILLIN 500 MG PO CAPS: 1000.0000 mg | ORAL_CAPSULE | Freq: Two times a day (BID) | ORAL | 0 refills | Status: DC

## 2022-01-26 MED ORDER — AMOXICILLIN 500 MG PO CAPS
1000.0000 mg | ORAL_CAPSULE | Freq: Once | ORAL | Status: AC
  Administered 2022-01-26: 1000 mg via ORAL
  Filled 2022-01-26: qty 2

## 2022-01-26 NOTE — Discharge Instructions (Addendum)
Make sure you are drinking enough fluids.  Take acetaminophen as needed for fever or aching.  Return if you have any new or concerning symptoms.

## 2022-01-28 ENCOUNTER — Encounter (HOSPITAL_COMMUNITY): Payer: Self-pay

## 2022-01-28 ENCOUNTER — Emergency Department (HOSPITAL_COMMUNITY)
Admission: EM | Admit: 2022-01-28 | Discharge: 2022-01-28 | Disposition: A | Discharge status: Home / Self Care | Payer: Medicare Other | Attending: Emergency Medicine | Admitting: Emergency Medicine

## 2022-01-28 ENCOUNTER — Emergency Department (HOSPITAL_COMMUNITY): Payer: Medicare Other

## 2022-01-28 DIAGNOSIS — J3489 Other specified disorders of nose and nasal sinuses: Secondary | ICD-10-CM | POA: Diagnosis not present

## 2022-01-28 DIAGNOSIS — R112 Nausea with vomiting, unspecified: Secondary | ICD-10-CM | POA: Diagnosis not present

## 2022-01-28 DIAGNOSIS — J449 Chronic obstructive pulmonary disease, unspecified: Secondary | ICD-10-CM | POA: Insufficient documentation

## 2022-01-28 DIAGNOSIS — Z7951 Long term (current) use of inhaled steroids: Secondary | ICD-10-CM | POA: Diagnosis not present

## 2022-01-28 DIAGNOSIS — Z7901 Long term (current) use of anticoagulants: Secondary | ICD-10-CM | POA: Diagnosis not present

## 2022-01-28 DIAGNOSIS — I4891 Unspecified atrial fibrillation: Secondary | ICD-10-CM | POA: Diagnosis not present

## 2022-01-28 DIAGNOSIS — I509 Heart failure, unspecified: Secondary | ICD-10-CM | POA: Insufficient documentation

## 2022-01-28 DIAGNOSIS — E1122 Type 2 diabetes mellitus with diabetic chronic kidney disease: Secondary | ICD-10-CM | POA: Diagnosis not present

## 2022-01-28 DIAGNOSIS — N189 Chronic kidney disease, unspecified: Secondary | ICD-10-CM | POA: Diagnosis not present

## 2022-01-28 DIAGNOSIS — Z79899 Other long term (current) drug therapy: Secondary | ICD-10-CM | POA: Diagnosis not present

## 2022-01-28 DIAGNOSIS — R1084 Generalized abdominal pain: Secondary | ICD-10-CM | POA: Diagnosis present

## 2022-01-28 LAB — CBC WITH DIFFERENTIAL/PLATELET
Abs Immature Granulocytes: 0.03 10*3/uL (ref 0.00–0.07)
Basophils Absolute: 0 10*3/uL (ref 0.0–0.1)
Basophils Relative: 0 %
Eosinophils Absolute: 0 10*3/uL (ref 0.0–0.5)
Eosinophils Relative: 0 %
HCT: 41.4 % (ref 39.0–52.0)
Hemoglobin: 13.5 g/dL (ref 13.0–17.0)
Immature Granulocytes: 0 %
Lymphocytes Relative: 7 %
Lymphs Abs: 0.7 10*3/uL (ref 0.7–4.0)
MCH: 29.5 pg (ref 26.0–34.0)
MCHC: 32.6 g/dL (ref 30.0–36.0)
MCV: 90.4 fL (ref 80.0–100.0)
Monocytes Absolute: 0.6 10*3/uL (ref 0.1–1.0)
Monocytes Relative: 6 %
Neutro Abs: 8.1 10*3/uL — ABNORMAL HIGH (ref 1.7–7.7)
Neutrophils Relative %: 87 %
Platelets: 175 10*3/uL (ref 150–400)
RBC: 4.58 MIL/uL (ref 4.22–5.81)
RDW: 16.9 % — ABNORMAL HIGH (ref 11.5–15.5)
WBC: 9.5 10*3/uL (ref 4.0–10.5)
nRBC: 0 % (ref 0.0–0.2)

## 2022-01-28 LAB — COMPREHENSIVE METABOLIC PANEL
ALT: 16 U/L (ref 0–44)
AST: 18 U/L (ref 15–41)
Albumin: 3.9 g/dL (ref 3.5–5.0)
Alkaline Phosphatase: 81 U/L (ref 38–126)
Anion gap: 8 (ref 5–15)
BUN: 20 mg/dL (ref 8–23)
CO2: 26 mmol/L (ref 22–32)
Calcium: 9.4 mg/dL (ref 8.9–10.3)
Chloride: 102 mmol/L (ref 98–111)
Creatinine, Ser: 1.37 mg/dL — ABNORMAL HIGH (ref 0.61–1.24)
GFR, Estimated: 55 mL/min — ABNORMAL LOW (ref >60)
Glucose, Bld: 106 mg/dL — ABNORMAL HIGH (ref 70–99)
Potassium: 4.2 mmol/L (ref 3.5–5.1)
Sodium: 136 mmol/L (ref 135–145)
Total Bilirubin: 1.3 mg/dL — ABNORMAL HIGH (ref 0.3–1.2)
Total Protein: 7.3 g/dL (ref 6.5–8.1)

## 2022-01-28 LAB — LACTIC ACID, PLASMA: Lactic Acid, Venous: 1.1 mmol/L (ref 0.5–1.9)

## 2022-01-28 LAB — URINALYSIS, ROUTINE W REFLEX MICROSCOPIC
Bacteria, UA: NONE SEEN
Bilirubin Urine: NEGATIVE
Glucose, UA: >=500 mg/dL — AB
Hgb urine dipstick: NEGATIVE
Ketones, ur: 5 mg/dL — AB
Leukocytes,Ua: NEGATIVE
Nitrite: NEGATIVE
Protein, ur: 100 mg/dL — AB
Specific Gravity, Urine: 1.02 (ref 1.005–1.030)
pH: 5 (ref 5.0–8.0)

## 2022-01-28 LAB — LIPASE, BLOOD: Lipase: 30 U/L (ref 11–51)

## 2022-01-28 MED ORDER — SODIUM CHLORIDE 0.9 % IV BOLUS
1000.0000 mL | Freq: Once | INTRAVENOUS | Status: AC
  Administered 2022-01-28: 1000 mL via INTRAVENOUS

## 2022-01-28 MED ORDER — OXYCODONE-ACETAMINOPHEN 5-325 MG PO TABS
2.0000 | ORAL_TABLET | Freq: Once | ORAL | Status: AC
  Administered 2022-01-28: 2 via ORAL
  Filled 2022-01-28: qty 2

## 2022-01-28 MED ORDER — ONDANSETRON HCL 4 MG/2ML IJ SOLN
4.0000 mg | Freq: Once | INTRAMUSCULAR | Status: AC
  Administered 2022-01-28: 4 mg via INTRAVENOUS
  Filled 2022-01-28: qty 2

## 2022-01-28 MED ORDER — FENTANYL CITRATE PF 50 MCG/ML IJ SOSY
50.0000 ug | PREFILLED_SYRINGE | Freq: Once | INTRAMUSCULAR | Status: AC
  Administered 2022-01-28: 50 ug via INTRAVENOUS
  Filled 2022-01-28: qty 1

## 2022-01-28 NOTE — ED Provider Triage Note (Addendum)
Emergency Medicine Provider Triage Evaluation Note  SANJEEV MAIN , a 71 y.o. male  was evaluated in triage.  Pt complains of pain in legs and feet for a long time, also umbilical hernia pain that has also been ongoing for a long time. Symptoms worse today, nausea, no vomiting, fever (max temp 100 has not had anything for fever due to vomiting), last bowel movement Friday, is passing gas Seen here 01/25/22 for same, started on abx for possible PNA on CT, no bowel obstruction or evidence of incarcerated hernia at that time.  Review of Systems  Positive: As above  Negative: As above  Physical Exam  BP (!) 142/75 (BP Location: Left Arm)   Pulse (!) 59   Temp 98.3 F (36.8 C) (Oral)   Resp 18   SpO2 94%  Gen:   Awake, rocking, appears uncomfortable.  Resp:  Normal effort  MSK:   Moves extremities without difficulty  Other:    Medical Decision Making  Medically screening exam initiated at 11:10 AM.  Appropriate orders placed.  NIGIL BRAMAN was informed that the remainder of the evaluation will be completed by another provider, this initial triage assessment does not replace that evaluation, and the importance of remaining in the ED until their evaluation is complete.     Tacy Learn, PA-C 01/28/22 1112    Tacy Learn, PA-C 01/28/22 1114

## 2022-01-28 NOTE — Telephone Encounter (Signed)
Patient's wife is returning call. 

## 2022-01-28 NOTE — ED Triage Notes (Signed)
Pt arrived via POV, c/o abd pain, nausea, has not been able to eat/drink. States was seen recently for same. Started on abx, not feeling any better

## 2022-01-28 NOTE — ED Provider Notes (Signed)
[facility] DEPT Provider Note   CSN: [id number] Arrival date & time: 01/28/22  1009     History  Chief Complaint  Patient presents with   Abdominal Pain    [name] is a 71 y.o. male.  Patient is a 71 year old male who presents with abdominal pain.  He has she has a history of some chronic abdominal pain that is been going on for over a year.  He says his abdominal pain is unchanged from his baseline.  However he is currently being treated for pneumonia on amoxicillin.  He says that he has had nausea and vomiting and has not been able to keep it down.  He has not been able to keep anything down for the last couple days.  He has been feeling very fatigued.  On chart review, he has a history of chronic abdominal pain, atrial fibrillation on Eliquis, chronic kidney disease, CHF, diabetes, COPD on home oxygen.  He was admitted from September 1 to September 6 for pneumonia which was found on the CT scan of his abdomen and pelvis when he presented for abdominal pain.  He was also seen on September 29 for abdominal pain and fatigue.  He was noted to have a right lower lobe infiltrate that likely was improving pneumonia on CT scan.  However given his ongoing symptoms and fever at that time, he was started on amoxicillin which she currently is on.  He does not report any fevers in the last 3 days.  He does have an ongoing cough.  He says his shortness of breath is slightly worse over the last few days.       Home Medications Prior to Admission medications   Medication Sig Start Date End Date Taking? Authorizing Provider  albuterol (PROVENTIL HFA;VENTOLIN HFA) 108 (90 Base) MCG/ACT inhaler Inhale 2 puffs into the lungs every 6 (six) hours as needed for wheezing or shortness of breath. 12/28/15   Collene Gobble, MD  amLODipine (NORVASC) 10 MG tablet Take 5 mg by mouth in the morning.    [provider]  amoxicillin (AMOXIL) 500 MG capsule Take 2  capsules (1,000 mg total) by mouth 2 (two) times daily. 11/04/60   Delora Fuel, MD  apixaban (ELIQUIS) 5 MG TABS tablet Take 1 tablet (5 mg total) by mouth 2 (two) times daily. 05/11/21   Allred, Jeneen Rinks, MD  bisoprolol (ZEBETA) 5 MG tablet Take 1 tablet (5 mg total) by mouth daily. Needs appt 11/19/21   Bensimhon, Shaune Pascal, MD  Budeson-Glycopyrrol-Formoterol (BREZTRI AEROSPHERE) 160-9-4.8 MCG/ACT AERO Inhale 2 puffs into the lungs in the morning and at bedtime. 11/30/20   Martyn Ehrich, NP  Calcium-Magnesium-Zinc (CAL-MAG-ZINC PO) Take 1 tablet by mouth at bedtime.    [provider]  Cholecalciferol (VITAMIN D3 SUPER STRENGTH) 50 MCG (2000 UT) CAPS Take 8,000 Units by mouth at bedtime.    [provider]  empagliflozin (JARDIANCE) 10 MG TABS tablet Take 1 tablet (10 mg total) by mouth daily before breakfast. Patient taking differently: Take 10 mg by mouth at bedtime. 03/14/20   Bensimhon, Shaune Pascal, MD  fexofenadine (ALLEGRA) 180 MG tablet Take 1 tablet (180 mg total) by mouth daily as needed for allergies or rhinitis. 01/23/20   Aline August, MD  gabapentin (NEURONTIN) 300 MG capsule Take 600-1,200 mg by mouth See admin instructions. Take 600 mg by mouth in the morning, 1,200 mg at bedtime, and an additional 600 mg once a day as needed for  neuropathy 09/13/14   [provider]  levothyroxine (SYNTHROID, LEVOTHROID) 175 MCG tablet Take 175 mcg by mouth daily before breakfast.    [provider]  LORazepam (ATIVAN) 1 MG tablet Take 1 mg by mouth at bedtime. 01/10/22   [provider]  Menthol-Camphor (TIGER BALM ARTHRITIS RUB EX) Apply 1 application topically as needed (for leg pain).    [provider]  montelukast (SINGULAIR) 10 MG tablet TAKE 1 TABLET(10 MG) BY MOUTH AT BEDTIME Patient taking differently: Take 10 mg by mouth at bedtime. 09/18/21   Collene Gobble, MD  Multiple Vitamin (MULTIVITAMIN WITH MINERALS) TABS tablet Take 1 tablet by mouth  daily. 12/15/19   Sheikh, Omair Latif, DO  ondansetron (ZOFRAN) 4 MG tablet Take 4 mg by mouth daily. 01/03/22   [provider]  ondansetron (ZOFRAN-ODT) 4 MG disintegrating tablet Take 1 tablet (4 mg total) by mouth every 8 (eight) hours as needed for nausea or vomiting. 1/44/31   Delora Fuel, MD  oxyCODONE-acetaminophen (PERCOCET/ROXICET) 5-325 MG tablet Take 1 tablet by mouth 2 (two) times daily as needed for moderate pain or severe pain.    [provider]  OXYGEN Inhale 2 L/min into the lungs See admin instructions. 2 L/min at bedtime and 2 L/min throughout the day as needed for shortness of breath    [provider]  oxymetazoline (AFRIN) 0.05 % nasal spray Place 1 spray into both nostrils as needed (for nose bleeds).    [provider]  pantoprazole (PROTONIX) 40 MG tablet Take 40 mg by mouth daily before breakfast.    [provider]  polyethylene glycol powder (GLYCOLAX/MIRALAX) 17 GM/SCOOP powder Take 17 g by mouth daily as needed for mild constipation. 01/23/20   Aline August, MD  potassium chloride (KLOR-CON M) 10 MEQ tablet Take 10 mEq by mouth every other day. Take along with Torsemide (30 mg)    [provider]  predniSONE (DELTASONE) 5 MG tablet Take 0.5 tablets (2.5 mg total) by mouth daily with breakfast. 10/22/21   Byrum, Rose Fillers, MD  rOPINIRole (REQUIP) 1 MG tablet Take 1 tablet (1 mg total) by mouth in the morning and at bedtime. 01/23/20   Aline August, MD  rosuvastatin (CRESTOR) 10 MG tablet Take 10 mg by mouth at bedtime.  08/08/16   [provider]  senna (SENOKOT) 8.6 MG TABS tablet Take 1 tablet (8.6 mg total) by mouth 2 (two) times daily. 01/02/22   Hosie Poisson, MD  spironolactone (ALDACTONE) 25 MG tablet Take 1 tablet (25 mg total) by mouth daily. 02/16/16   Shirley Friar, PA-C  SYSTANE ULTRA PF 0.4-0.3 % SOLN Apply 1 drop to eye 3 (three) times daily as needed (for dryness).    [provider]  torsemide (DEMADEX) 20 MG tablet Take 30 mg by mouth every other day. Take along with Potassium (10 meq)    [provider]  traMADol (ULTRAM) 50 MG tablet Take 100 mg by mouth 2 (two) times daily. 10/26/20   [provider]  traZODone (DESYREL) 50 MG tablet Take 50 mg by mouth at bedtime. 08/30/20   [provider]      Allergies    Meloxicam and Vancomycin    Review of Systems   Review of Systems  Constitutional:  Positive for fatigue. Negative for chills, diaphoresis and fever.  HENT:  Negative for congestion, rhinorrhea and sneezing.   Eyes: Negative.   Respiratory:  Positive for cough, shortness of breath and wheezing.  Negative for chest tightness.   Cardiovascular:  Negative for chest pain and leg swelling.  Gastrointestinal:  Positive for abdominal pain, nausea and vomiting. Negative for blood in stool and diarrhea.  Genitourinary:  Negative for difficulty urinating, flank pain, frequency and hematuria.  Musculoskeletal:  Positive for myalgias (Chronic leg pain). Negative for arthralgias and back pain.  Skin:  Negative for rash.  Neurological:  Negative for dizziness, speech difficulty, weakness, numbness and headaches.    Physical Exam Updated Vital Signs BP 119/67   Pulse 60   Temp 98 F (36.7 C) (Oral)   Resp 16   SpO2 98%  Physical Exam Constitutional:      Appearance: He is well-developed.  HENT:     Head: Normocephalic and atraumatic.  Eyes:     Pupils: Pupils are equal, round, and reactive to light.  Cardiovascular:     Rate and Rhythm: Normal rate and regular rhythm.     Heart sounds: Normal heart sounds.  Pulmonary:     Effort: Pulmonary effort is normal. No respiratory distress.     Breath sounds: Wheezing and rhonchi present. No rales.  Chest:     Chest wall: No tenderness.  Abdominal:     General: Bowel sounds are normal.     Palpations: Abdomen is soft.     Tenderness: There is abdominal tenderness (Positive ventral  hernia that is easily reducible.  There is tenderness around this area). There is no guarding or rebound.  Musculoskeletal:        General: Normal range of motion.     Cervical back: Normal range of motion and neck supple.     Comments: No edema or calf tenderness  Lymphadenopathy:     Cervical: No cervical adenopathy.  Skin:    General: Skin is warm and dry.     Findings: No rash.  Neurological:     Mental Status: He is alert and oriented to person, place, and time.     ED Results / Procedures / Treatments   Labs (all labs ordered are listed, but only abnormal results are displayed) Labs Reviewed  CBC WITH DIFFERENTIAL/PLATELET - Abnormal; Notable for the following components:      Result Value   RDW 16.9 (*)    Neutro Abs 8.1 (*)    All other components within normal limits  COMPREHENSIVE METABOLIC PANEL - Abnormal; Notable for the following components:   Glucose, Bld 106 (*)    Creatinine, Ser 1.37 (*)    Total Bilirubin 1.3 (*)    GFR, Estimated 55 (*)    All other components within normal limits  URINALYSIS, ROUTINE W REFLEX MICROSCOPIC - Abnormal; Notable for the following components:   Glucose, UA >=500 (*)    Ketones, ur 5 (*)    Protein, ur 100 (*)    All other components within normal limits  LIPASE, BLOOD  LACTIC ACID, PLASMA    EKG None  Radiology DG ABD ACUTE 2+V W 1V CHEST  Result Date: 01/28/2022 CLINICAL DATA:  Cough, abdominal pain, umbilical hernia EXAM: DG ABDOMEN ACUTE WITH 1 VIEW CHEST COMPARISON:  01/25/2022, 01/26/2022 FINDINGS: Supine and upright frontal views of the abdomen as well as an upright frontal view of the chest are obtained. Cardiac silhouette is stable. Dual lead pacer unchanged. No acute airspace disease, effusion, or pneumothorax. No acute bony abnormality. Residual oral contrast is seen within the proximal colon. No bowel obstruction or ileus. No free gas within the greater peritoneal sac. No masses or  abnormal calcifications. No  acute bony abnormalities. IMPRESSION: 1. No acute intrathoracic process. The patchy right basilar airspace disease and small right effusion seen on recent CT are not visible by x-ray. 2. Unremarkable bowel gas pattern. Electronically Signed   By: Randa Ngo M.D.   On: 01/28/2022 20:09    Procedures Procedures    Medications Ordered in ED Medications  oxyCODONE-acetaminophen (PERCOCET/ROXICET) 5-325 MG per tablet 2 tablet (has no administration in time range)  sodium chloride 0.9 % bolus 1,000 mL (0 mLs Intravenous Stopped 01/28/22 2256)  ondansetron (ZOFRAN) injection 4 mg (4 mg Intravenous Given 01/28/22 1938)  fentaNYL (SUBLIMAZE) injection 50 mcg (50 mcg Intravenous Given 01/28/22 2001)    ED Course/ Medical Decision Making/ A&P                           Medical Decision Making Problems Addressed: Generalized abdominal pain: chronic illness or injury with exacerbation, progression, or side effects of treatment Nausea and vomiting, unspecified vomiting type: acute illness or injury with systemic symptoms  Amount and/or Complexity of Data Reviewed Independent Historian: spouse External Data Reviewed: notes. Labs: ordered. Decision-making details documented in ED Course. Radiology: ordered and independent interpretation performed. Decision-making details documented in ED Course.  Risk Prescription drug management. Decision regarding hospitalization.   Patient is a 71 year old male who presents with abdominal pain.  He also has some nausea and vomiting.  Appears to be more of a chronic issue.  He says its been going on about a year any does not seem to have any change in symptoms.  He has had some nausea and vomiting with some generalized weakness.  He has had 3 CT scans over the last 2 months.  He has been unrevealing.  He has some known gallstones but he does not seem to have any tenderness specifically over this area.  He has a ventral hernia which is easily reducible.  He has not  had any suggestions of obstruction on his prior scans.  Acute abdominal series was performed today.  There is no evidence of pneumonia on his chest x-ray.  No evidence of obstruction on his abdominal films.  These were interpreted by me and confirmed by radiology.  His labs are reviewed and are nonconcerning.  He was given IV fluids and antiemetics.  He is feeling much better.  He has had no vomiting in the ED.  He was given a dose of his chronic pain medication.  He is able to eat and drink without vomiting or increased pain.  Do not feel that patient needs a repeat CT scan given that his symptoms are similar to prior and he just had a CT scan a few days ago.  He was discharged home in good condition.  We will follow-up with his primary care doctor.  Return precautions were given.  {Final Clinical Impression(s) / ED Diagnoses Final diagnoses:  Nausea and vomiting, unspecified vomiting type  Generalized abdominal pain    Rx / DC Orders ED Discharge Orders     None         Malvin Johns, MD 01/28/22 2310

## 2022-01-28 NOTE — Telephone Encounter (Signed)
Spoke with pt's wife, verbal ok from pt.  Pt has actually already had the Colonoscopy at [facility], and this surgery for 02/27/22 has been cancelled.

## 2022-01-31 LAB — CULTURE, BLOOD (ROUTINE X 2)
Culture: NO GROWTH
Culture: NO GROWTH
Special Requests: ADEQUATE

## 2022-02-06 ENCOUNTER — Ambulatory Visit (INDEPENDENT_AMBULATORY_CARE_PROVIDER_SITE_OTHER): Payer: Medicare Other

## 2022-02-06 DIAGNOSIS — I441 Atrioventricular block, second degree: Secondary | ICD-10-CM

## 2022-02-06 LAB — CUP PACEART REMOTE DEVICE CHECK
Battery Remaining Longevity: 121 mo
Battery Remaining Percentage: 90 %
Battery Voltage: 3.02 V
Brady Statistic RV Percent Paced: 99 %
Date Time Interrogation Session: 20231011020012
Implantable Lead Implant Date: 20131203
Implantable Lead Implant Date: 20131203
Implantable Lead Location: 753859
Implantable Lead Location: 753860
Implantable Lead Model: 1948
Implantable Pulse Generator Implant Date: 20220412
Lead Channel Impedance Value: 610 Ohm
Lead Channel Pacing Threshold Amplitude: 1.375 V
Lead Channel Pacing Threshold Pulse Width: 0.5 ms
Lead Channel Sensing Intrinsic Amplitude: 10.8 mV
Lead Channel Setting Pacing Amplitude: 1.625
Lead Channel Setting Pacing Pulse Width: 0.5 ms
Lead Channel Setting Sensing Sensitivity: 4 mV
Pulse Gen Model: 2272
Pulse Gen Serial Number: 3919039

## 2022-02-14 ENCOUNTER — Other Ambulatory Visit (HOSPITAL_COMMUNITY): Payer: Self-pay | Admitting: Physician Assistant

## 2022-02-14 ENCOUNTER — Other Ambulatory Visit: Payer: Self-pay | Admitting: Physician Assistant

## 2022-02-14 DIAGNOSIS — R112 Nausea with vomiting, unspecified: Secondary | ICD-10-CM

## 2022-02-14 DIAGNOSIS — K8020 Calculus of gallbladder without cholecystitis without obstruction: Secondary | ICD-10-CM

## 2022-02-19 NOTE — Progress Notes (Signed)
Remote pacemaker transmission.   

## 2022-02-20 ENCOUNTER — Ambulatory Visit
Admission: RE | Admit: 2022-02-20 | Discharge: 2022-02-20 | Disposition: A | Discharge status: Home / Self Care | Payer: Medicare Other | Source: Ambulatory Visit | Attending: Physician Assistant | Admitting: Physician Assistant

## 2022-02-20 DIAGNOSIS — R112 Nausea with vomiting, unspecified: Secondary | ICD-10-CM

## 2022-02-25 ENCOUNTER — Other Ambulatory Visit (HOSPITAL_COMMUNITY): Payer: Self-pay

## 2022-02-25 ENCOUNTER — Encounter (HOSPITAL_COMMUNITY)
Admission: RE | Admit: 2022-02-25 | Discharge: 2022-02-25 | Disposition: A | Discharge status: Home / Self Care | Payer: Medicare Other | Source: Ambulatory Visit | Attending: Physician Assistant | Admitting: Physician Assistant

## 2022-02-25 DIAGNOSIS — R112 Nausea with vomiting, unspecified: Secondary | ICD-10-CM | POA: Diagnosis present

## 2022-02-25 DIAGNOSIS — K8020 Calculus of gallbladder without cholecystitis without obstruction: Secondary | ICD-10-CM | POA: Diagnosis present

## 2022-02-25 MED ORDER — TECHNETIUM TC 99M MEBROFENIN IV KIT
4.9900 | PACK | Freq: Once | INTRAVENOUS | Status: AC | PRN
  Administered 2022-02-25: 4.99 via INTRAVENOUS

## 2022-02-27 ENCOUNTER — Telehealth (HOSPITAL_COMMUNITY): Payer: Self-pay

## 2022-02-27 ENCOUNTER — Encounter (HOSPITAL_COMMUNITY): Payer: Self-pay

## 2022-02-27 ENCOUNTER — Ambulatory Visit (HOSPITAL_COMMUNITY): Admit: 2022-02-27 | Payer: Medicare Other | Admitting: Gastroenterology

## 2022-02-27 SURGERY — COLONOSCOPY
Anesthesia: Monitor Anesthesia Care

## 2022-02-27 NOTE — Telephone Encounter (Signed)
Advanced Heart Failure Patient Advocate Encounter  Received notification that it is time to renew patient assistance for Jardiance [facility]). This patient may be eligible for a grant that would cover medication costs without needing to be approved for manufacturer assistance.  Attempted to call patient; no answer, left voicemail.  Clista Bernhardt, CPhT Rx Patient Advocate Phone: [phone]

## 2022-03-01 NOTE — Telephone Encounter (Signed)
Advanced Heart Failure Patient Advocate Encounter  Spoke to patients wife, she will provide basic information for grant approval on Monday 11/6, and would like to know if samples are available for Eliquis.  Will speak with her again on Monday

## 2022-03-04 ENCOUNTER — Telehealth (HOSPITAL_COMMUNITY): Payer: Self-pay

## 2022-03-04 NOTE — Telephone Encounter (Signed)
Advanced Heart Failure Patient Advocate Encounter  Spoke with wife on the phone. Samples of eliquis are at registration desk for pickup. Wife will call back to confirm income information for grant application.

## 2022-03-04 NOTE — Telephone Encounter (Signed)
Advanced Heart Failure Patient Advocate Encounter  Medication Samples have been left at registration desk for patient.   Drug name: Eliquis '5MG'$  Qty: 4x (14 ct) package LOT: [id number] Exp.: 08/2023 SIG: Take 1 tablet by mouth twice daily.   The patient has been instructed regarding the correct time, dose, and frequency of taking this medication, including desired effects and most common side effects.   Clista Bernhardt, CPhT Rx Patient Advocate Phone: [phone]

## 2022-03-05 ENCOUNTER — Other Ambulatory Visit (HOSPITAL_COMMUNITY): Payer: Self-pay

## 2022-03-05 NOTE — Telephone Encounter (Signed)
Advanced Heart Failure Patient Advocate Encounter  The patient was approved for a [employer] grant that will help cover the cost of Jardiance.  Total amount awarded, $10,000.  Effective: 02/03/2022 - 02/03/2023.  BIN [id number] PCN [id number] [id number] ID [id number]  Approval and processing information left at registration desk along with Eliquis samples.

## 2022-04-03 ENCOUNTER — Encounter: Payer: Self-pay | Admitting: Podiatry

## 2022-04-03 ENCOUNTER — Ambulatory Visit (INDEPENDENT_AMBULATORY_CARE_PROVIDER_SITE_OTHER): Payer: Medicare Other

## 2022-04-03 ENCOUNTER — Ambulatory Visit: Payer: Medicare Other | Admitting: Podiatry

## 2022-04-03 DIAGNOSIS — D689 Coagulation defect, unspecified: Secondary | ICD-10-CM

## 2022-04-03 DIAGNOSIS — M2041 Other hammer toe(s) (acquired), right foot: Secondary | ICD-10-CM

## 2022-04-03 DIAGNOSIS — M2042 Other hammer toe(s) (acquired), left foot: Secondary | ICD-10-CM

## 2022-04-03 DIAGNOSIS — L84 Corns and callosities: Secondary | ICD-10-CM

## 2022-04-03 DIAGNOSIS — M21621 Bunionette of right foot: Secondary | ICD-10-CM

## 2022-04-03 MED ORDER — DOXYCYCLINE HYCLATE 100 MG PO TABS: 100.0000 mg | ORAL_TABLET | Freq: Two times a day (BID) | ORAL | 1 refills | Status: DC

## 2022-04-03 NOTE — Progress Notes (Signed)
Subjective:   Patient ID: [name], male   DOB: 71 y.o.   MRN: [id number]   HPI Patient presents stating he has a lesion on the plantar aspect right fifth metatarsal that is been present for a while but its gotten worse recently he has been checked and found his circulation to be adequate and he presents with caregiver.  Patient does not smoke is not active with a lot of leg pain in general   Review of Systems  All other systems reviewed and are negative.       Objective:  Physical Exam Vitals and nursing note reviewed.  Constitutional:      Appearance: He is well-developed.  Pulmonary:     Effort: Pulmonary effort is normal.  Musculoskeletal:        General: Normal range of motion.  Skin:    General: Skin is warm.  Neurological:     Mental Status: He is alert.     Vascular status was found to be intact but I did note that he does have some slight breakdown of the digits bilateral and does have redness but he has been checked by a vascular doctor recently.  He is found on the right to have keratotic tissue formation underneath the fifth metatarsal head localized to this area with slight redness and drainage around the area but no active drainage no proximal edema erythema drainage noted.     Assessment:  Probability for tailor's bunion deformity right with pressure against the underlying bone structure creating this inflammatory process with the possibility of low-grade localized infection     Plan:  H&P the x-ray reviewed and I did go ahead I did sharp sterile debridement of the lesion no iatrogenic bleeding was noted or pus.  Since there is some redness around the area localized I did place him on doxycycline for 10 days and I offloaded weight off the fifth metatarsal head instructed on soaks and if any changes were to occur he is to reappoint immediately and hopefully this will just heal uneventfully and it is possible in future he may need met head resection if symptoms  persist  X-rays indicate no signs of osteolysis around the fifth metatarsal head right appears to be more soft tissue

## 2022-04-18 ENCOUNTER — Ambulatory Visit: Payer: Medicare Other | Admitting: Emergency Medicine

## 2022-04-26 ENCOUNTER — Emergency Department (HOSPITAL_COMMUNITY)
Admission: EM | Admit: 2022-04-26 | Discharge: 2022-04-26 | Disposition: A | Discharge status: Home / Self Care | Payer: Medicare Other | Attending: Emergency Medicine | Admitting: Emergency Medicine

## 2022-04-26 ENCOUNTER — Other Ambulatory Visit: Payer: Self-pay

## 2022-04-26 ENCOUNTER — Encounter (HOSPITAL_COMMUNITY): Payer: Self-pay

## 2022-04-26 ENCOUNTER — Emergency Department (HOSPITAL_COMMUNITY): Payer: Medicare Other

## 2022-04-26 DIAGNOSIS — J449 Chronic obstructive pulmonary disease, unspecified: Secondary | ICD-10-CM | POA: Insufficient documentation

## 2022-04-26 DIAGNOSIS — I2510 Atherosclerotic heart disease of native coronary artery without angina pectoris: Secondary | ICD-10-CM | POA: Diagnosis not present

## 2022-04-26 DIAGNOSIS — M791 Myalgia, unspecified site: Secondary | ICD-10-CM | POA: Diagnosis present

## 2022-04-26 DIAGNOSIS — J111 Influenza due to unidentified influenza virus with other respiratory manifestations: Secondary | ICD-10-CM

## 2022-04-26 DIAGNOSIS — R0602 Shortness of breath: Secondary | ICD-10-CM | POA: Diagnosis not present

## 2022-04-26 DIAGNOSIS — I5032 Chronic diastolic (congestive) heart failure: Secondary | ICD-10-CM | POA: Diagnosis not present

## 2022-04-26 DIAGNOSIS — Z1152 Encounter for screening for COVID-19: Secondary | ICD-10-CM | POA: Diagnosis not present

## 2022-04-26 DIAGNOSIS — Z79899 Other long term (current) drug therapy: Secondary | ICD-10-CM | POA: Insufficient documentation

## 2022-04-26 DIAGNOSIS — I4891 Unspecified atrial fibrillation: Secondary | ICD-10-CM | POA: Diagnosis not present

## 2022-04-26 DIAGNOSIS — Z7901 Long term (current) use of anticoagulants: Secondary | ICD-10-CM | POA: Diagnosis not present

## 2022-04-26 DIAGNOSIS — J101 Influenza due to other identified influenza virus with other respiratory manifestations: Secondary | ICD-10-CM | POA: Diagnosis not present

## 2022-04-26 LAB — BASIC METABOLIC PANEL
Anion gap: 11 (ref 5–15)
BUN: 33 mg/dL — ABNORMAL HIGH (ref 8–23)
CO2: 23 mmol/L (ref 22–32)
Calcium: 9.7 mg/dL (ref 8.9–10.3)
Chloride: 107 mmol/L (ref 98–111)
Creatinine, Ser: 1.46 mg/dL — ABNORMAL HIGH (ref 0.61–1.24)
GFR, Estimated: 51 mL/min — ABNORMAL LOW (ref >60)
Glucose, Bld: 115 mg/dL — ABNORMAL HIGH (ref 70–99)
Potassium: 3.9 mmol/L (ref 3.5–5.1)
Sodium: 141 mmol/L (ref 135–145)

## 2022-04-26 LAB — RESP PANEL BY RT-PCR (RSV, FLU A&B, COVID)  RVPGX2
Influenza A by PCR: POSITIVE — AB
Influenza B by PCR: NEGATIVE
Resp Syncytial Virus by PCR: NEGATIVE
SARS Coronavirus 2 by RT PCR: NEGATIVE

## 2022-04-26 LAB — CBC
HCT: 49.3 % (ref 39.0–52.0)
Hemoglobin: 15.9 g/dL (ref 13.0–17.0)
MCH: 29.6 pg (ref 26.0–34.0)
MCHC: 32.3 g/dL (ref 30.0–36.0)
MCV: 91.8 fL (ref 80.0–100.0)
Platelets: 124 10*3/uL — ABNORMAL LOW (ref 150–400)
RBC: 5.37 MIL/uL (ref 4.22–5.81)
RDW: 16.3 % — ABNORMAL HIGH (ref 11.5–15.5)
WBC: 6.6 10*3/uL (ref 4.0–10.5)
nRBC: 0 % (ref 0.0–0.2)

## 2022-04-26 LAB — TROPONIN I (HIGH SENSITIVITY)
Troponin I (High Sensitivity): 39 ng/L — ABNORMAL HIGH (ref <18)
Troponin I (High Sensitivity): 38 ng/L — ABNORMAL HIGH (ref <18)

## 2022-04-26 MED ORDER — ACETAMINOPHEN 325 MG PO TABS
650.0000 mg | ORAL_TABLET | Freq: Once | ORAL | Status: AC
  Administered 2022-04-26: 650 mg via ORAL
  Filled 2022-04-26: qty 2

## 2022-04-26 MED ORDER — IPRATROPIUM-ALBUTEROL 0.5-2.5 (3) MG/3ML IN SOLN
3.0000 mL | Freq: Once | RESPIRATORY_TRACT | Status: AC
  Administered 2022-04-26: 3 mL via RESPIRATORY_TRACT
  Filled 2022-04-26: qty 3

## 2022-04-26 MED ORDER — DEXAMETHASONE 4 MG PO TABS
10.0000 mg | ORAL_TABLET | Freq: Once | ORAL | Status: AC
  Administered 2022-04-26: 10 mg via ORAL
  Filled 2022-04-26: qty 1

## 2022-04-26 MED ORDER — BENZONATATE 100 MG PO CAPS: 100.0000 mg | ORAL_CAPSULE | Freq: Four times a day (QID) | ORAL | 0 refills | Status: DC | PRN

## 2022-04-26 MED ORDER — LACTATED RINGERS IV BOLUS
1000.0000 mL | Freq: Once | INTRAVENOUS | Status: AC
  Administered 2022-04-26: 1000 mL via INTRAVENOUS

## 2022-04-26 MED ORDER — OSELTAMIVIR PHOSPHATE 75 MG PO CAPS: 75.0000 mg | ORAL_CAPSULE | Freq: Two times a day (BID) | ORAL | 0 refills | Status: AC

## 2022-04-26 MED ORDER — DEXAMETHASONE 1 MG/ML PO CONC
10.0000 mg | Freq: Once | ORAL | Status: DC
  Filled 2022-04-26: qty 10

## 2022-04-26 MED ORDER — ONDANSETRON HCL 4 MG PO TABS: 4.0000 mg | ORAL_TABLET | Freq: Three times a day (TID) | ORAL | 0 refills | Status: DC | PRN

## 2022-04-26 NOTE — ED Triage Notes (Signed)
Generalized body aches, n/v/d, shob, and chest pains since 12/26.   Uses 2L [place] [employer] at home but has ran out of oxygen.   Becomes very shob with exertion.

## 2022-04-26 NOTE — ED Provider Notes (Signed)
[facility] DEPT Provider Note   CSN: [id number] Arrival date & time: 04/26/22  0411     History  Chief Complaint  Patient presents with   Generalized Body Aches    [name] is a 71 y.o. male.  HPI Patient presenting with several days of bodyaches, shortness of breath, and fever to 101 at home.  He has a complex medical history including chronic diastolic heart failure, coronary artery disease, paroxysmal atrial fibrillation on Tikosyn and Eliquis, COPD on chronic steroids (prednisone 2.'5mg'$  daily), chronic kidney disease.  He came in to the ED today at the urging of his wife.  He uses 2 L of oxygen intermittently at home but ran out yesterday, he has since been able to get a portable O2 container for home.  Has not been able to eat or drink well over this time period.    Home Medications Prior to Admission medications   Medication Sig Start Date End Date Taking? Authorizing Provider  benzonatate (TESSALON PERLES) 100 MG capsule Take 1 capsule (100 mg total) by mouth every 6 (six) hours as needed for cough. 04/26/22 04/26/23 Yes Eppie Gibson, MD  ondansetron (ZOFRAN) 4 MG tablet Take 1 tablet (4 mg total) by mouth every 8 (eight) hours as needed for nausea or vomiting. 04/26/22  Yes Eppie Gibson, MD  oseltamivir (TAMIFLU) 75 MG capsule Take 1 capsule (75 mg total) by mouth 2 (two) times daily for 5 days. 04/26/22 05/01/22 Yes Eppie Gibson, MD  albuterol (PROVENTIL HFA;VENTOLIN HFA) 108 (90 Base) MCG/ACT inhaler Inhale 2 puffs into the lungs every 6 (six) hours as needed for wheezing or shortness of breath. 12/28/15   Collene Gobble, MD  amLODipine (NORVASC) 10 MG tablet Take 5 mg by mouth in the morning.    [provider]  amoxicillin (AMOXIL) 500 MG capsule Take 2 capsules (1,000 mg total) by mouth 2 (two) times daily. 6/56/81   Delora Fuel, MD  apixaban (ELIQUIS) 5 MG TABS tablet Take 1 tablet (5 mg total) by mouth 2 (two)  times daily. 05/11/21   Allred, Jeneen Rinks, MD  bisoprolol (ZEBETA) 5 MG tablet Take 1 tablet (5 mg total) by mouth daily. Needs appt 11/19/21   Bensimhon, Shaune Pascal, MD  Budeson-Glycopyrrol-Formoterol (BREZTRI AEROSPHERE) 160-9-4.8 MCG/ACT AERO Inhale 2 puffs into the lungs in the morning and at bedtime. 11/30/20   Martyn Ehrich, NP  Calcium-Magnesium-Zinc (CAL-MAG-ZINC PO) Take 1 tablet by mouth at bedtime.    [provider]  Cholecalciferol (VITAMIN D3 SUPER STRENGTH) 50 MCG (2000 UT) CAPS Take 8,000 Units by mouth at bedtime.    [provider]  doxycycline (VIBRA-TABS) 100 MG tablet Take 1 tablet (100 mg total) by mouth 2 (two) times daily. 04/03/22   Wallene Huh, DPM  empagliflozin (JARDIANCE) 10 MG TABS tablet Take 1 tablet (10 mg total) by mouth daily before breakfast. Patient taking differently: Take 10 mg by mouth at bedtime. 03/14/20   Bensimhon, Shaune Pascal, MD  fexofenadine (ALLEGRA) 180 MG tablet Take 1 tablet (180 mg total) by mouth daily as needed for allergies or rhinitis. 01/23/20   Aline August, MD  gabapentin (NEURONTIN) 300 MG capsule Take 600-1,200 mg by mouth See admin instructions. Take 600 mg by mouth in the morning, 1,200 mg at bedtime, and an additional 600 mg once a day as needed for neuropathy 09/13/14   [provider]  levothyroxine (SYNTHROID, LEVOTHROID) 175 MCG tablet Take 175 mcg by mouth daily  before breakfast.    [provider]  LORazepam (ATIVAN) 1 MG tablet Take 1 mg by mouth at bedtime. 01/10/22   [provider]  Menthol-Camphor (TIGER BALM ARTHRITIS RUB EX) Apply 1 application topically as needed (for leg pain).    [provider]  montelukast (SINGULAIR) 10 MG tablet TAKE 1 TABLET(10 MG) BY MOUTH AT BEDTIME Patient taking differently: Take 10 mg by mouth at bedtime. 09/18/21   Collene Gobble, MD  Multiple Vitamin (MULTIVITAMIN WITH MINERALS) TABS tablet Take 1 tablet by mouth daily. 12/15/19   Sheikh, Omair  Latif, DO  ondansetron (ZOFRAN) 4 MG tablet Take 4 mg by mouth daily. 01/03/22   [provider]  ondansetron (ZOFRAN-ODT) 4 MG disintegrating tablet Take 1 tablet (4 mg total) by mouth every 8 (eight) hours as needed for nausea or vomiting. 1/61/09   Delora Fuel, MD  oxyCODONE-acetaminophen (PERCOCET/ROXICET) 5-325 MG tablet Take 1 tablet by mouth 2 (two) times daily as needed for moderate pain or severe pain.    [provider]  OXYGEN Inhale 2 L/min into the lungs See admin instructions. 2 L/min at bedtime and 2 L/min throughout the day as needed for shortness of breath    [provider]  oxymetazoline (AFRIN) 0.05 % nasal spray Place 1 spray into both nostrils as needed (for nose bleeds).    [provider]  pantoprazole (PROTONIX) 40 MG tablet Take 40 mg by mouth daily before breakfast.    [provider]  polyethylene glycol powder (GLYCOLAX/MIRALAX) 17 GM/SCOOP powder Take 17 g by mouth daily as needed for mild constipation. 01/23/20   Aline August, MD  potassium chloride (KLOR-CON M) 10 MEQ tablet Take 10 mEq by mouth every other day. Take along with Torsemide (30 mg)    [provider]  predniSONE (DELTASONE) 5 MG tablet Take 0.5 tablets (2.5 mg total) by mouth daily with breakfast. 10/22/21   Byrum, Rose Fillers, MD  rOPINIRole (REQUIP) 1 MG tablet Take 1 tablet (1 mg total) by mouth in the morning and at bedtime. 01/23/20   Aline August, MD  rosuvastatin (CRESTOR) 10 MG tablet Take 10 mg by mouth at bedtime.  08/08/16   [provider]  senna (SENOKOT) 8.6 MG TABS tablet Take 1 tablet (8.6 mg total) by mouth 2 (two) times daily. 01/02/22   Hosie Poisson, MD  spironolactone (ALDACTONE) 25 MG tablet Take 1 tablet (25 mg total) by mouth daily. 02/16/16   Shirley Friar, PA-C  SYSTANE ULTRA PF 0.4-0.3 % SOLN Apply 1 drop to eye 3 (three) times daily as needed (for dryness).    [provider]  torsemide (DEMADEX) 20 MG  tablet Take 30 mg by mouth every other day. Take along with Potassium (10 meq)    [provider]  traMADol (ULTRAM) 50 MG tablet Take 100 mg by mouth 2 (two) times daily. 10/26/20   [provider]  traZODone (DESYREL) 50 MG tablet Take 50 mg by mouth at bedtime. 08/30/20   [provider]      Allergies    Meloxicam and Vancomycin    Review of Systems   Review of Systems  Constitutional:  Positive for activity change, appetite change, chills, fatigue and fever.  Respiratory:  Positive for shortness of breath and wheezing.   Cardiovascular:  Negative for chest pain.  All other systems reviewed and are negative.   Physical Exam Updated Vital Signs BP (!) 144/84 (BP Location: Right Arm)   Pulse 61  Temp 98.8 F (37.1 C) (Oral)   Resp 18   Ht '5\' 11"'$  (1.803 m)   Wt 79.4 kg   SpO2 99%   BMI 24.41 kg/m  Physical Exam HENT:     Mouth/Throat:     Mouth: Mucous membranes are dry.  Eyes:     Extraocular Movements: Extraocular movements intact.  Cardiovascular:     Rate and Rhythm: Normal rate and regular rhythm.     Pulses: Normal pulses.     Heart sounds: Murmur: 2/6 systolic.  Pulmonary:     Comments: Speaking in 3 or 4 word phrases, lungs diffusely coarse with wheezes in the bilateral bases Abdominal:     General: Abdomen is flat. There is no distension.     Palpations: Abdomen is soft.     Tenderness: There is no abdominal tenderness.  Musculoskeletal:     Cervical back: Neck supple.  Skin:    Comments: Poor turgor     ED Results / Procedures / Treatments   Labs (all labs ordered are listed, but only abnormal results are displayed) Labs Reviewed  RESP PANEL BY RT-PCR (RSV, FLU A&B, COVID)  RVPGX2 - Abnormal; Notable for the following components:      Result Value   Influenza A by PCR POSITIVE (*)    All other components within normal limits  BASIC METABOLIC PANEL - Abnormal; Notable for the following components:   Glucose, Bld 115 (*)     BUN 33 (*)    Creatinine, Ser 1.46 (*)    GFR, Estimated 51 (*)    All other components within normal limits  CBC - Abnormal; Notable for the following components:   RDW 16.3 (*)    Platelets 124 (*)    All other components within normal limits  TROPONIN I (HIGH SENSITIVITY) - Abnormal; Notable for the following components:   Troponin I (High Sensitivity) 39 (*)    All other components within normal limits  TROPONIN I (HIGH SENSITIVITY) - Abnormal; Notable for the following components:   Troponin I (High Sensitivity) 38 (*)    All other components within normal limits    EKG EKG Interpretation  Date/Time:  Friday April 26 2022 04:37:43 EST Ventricular Rate:  60 PR Interval:    QRS Duration: 182 QT Interval:  495 QTC Calculation: 495 R Axis:   -77 Text Interpretation: Atrial fibrillation Ventricular bigeminy LVH with IVCD, LAD and secondary repol abnrm Anterolateral infarct, age indeterminate No significant change since last tracing Confirmed by Gareth Morgan [phone]) on 04/26/2022 7:37:51 AM  Radiology DG Chest 2 View  Result Date: 04/26/2022 CLINICAL DATA:  71 year old male with shortness of breath, nausea, diarrhea. EXAM: CHEST - 2 VIEW COMPARISON:  Chest and abdominal series 01/28/2022 and earlier. FINDINGS: Upright AP and lateral views of the chest at 0459 hours. Chronic left chest cardiac pacemaker, prosthetic cardiac valve, left atrial appendage surgery, sternotomy. Stable lung volumes and mediastinal contours. Mild cardiomegaly. No pneumothorax, pleural effusion or consolidation. Lung markings appear stable over series of exams since August. No convincing pulmonary edema or acute pulmonary opacity. No acute osseous abnormality identified. Negative visible bowel gas pattern. IMPRESSION: No acute cardiopulmonary abnormality. Electronically Signed   By: Genevie Ann M.D.   On: 04/26/2022 05:34    Procedures Procedures    Medications Ordered in ED Medications   ipratropium-albuterol (DUONEB) 0.5-2.5 (3) MG/3ML nebulizer solution 3 mL (has no administration in time range)  dexamethasone (DECADRON) 1 MG/ML solution 10 mg (has no administration in time  range)  lactated ringers bolus 1,000 mL (1,000 mLs Intravenous New Bag/Given 04/26/22 0743)  acetaminophen (TYLENOL) tablet 650 mg (650 mg Oral Given 04/26/22 0741)  ipratropium-albuterol (DUONEB) 0.5-2.5 (3) MG/3ML nebulizer solution 3 mL (3 mLs Nebulization Given 04/26/22 0865)    ED Course/ Medical Decision Making/ A&P                           Medical Decision Making Amount and/or Complexity of Data Reviewed Labs: ordered. Radiology: ordered.  Risk OTC drugs. Prescription drug management.   Patient presenting with new influenza A infection in the setting of multiple chronic illnesses.  Labs significant for WBC wnl at 6.6. Trop slightly elevated to 39, suspect demand ischemia in the setting of hypovolemia 2/2 acute viral illness. Hypovolemic appearing on initial exam, therefore given fluid bolus.  Diffuse wheeze and coarse breath sounds, DuoNeb given x2. Given COPD and persistent wheeze, given a dose of decadron prior to discharge. Do not see an indication for antibiotic coverage or a multiday course of steroids. Trop trended flat 39>38.  On home O2, hemodynamically stable. Stable for discharge home. Discussed Tamiflu with patient--he elects to start a course. Will also send home with Tessalon Perles and Zofran for symptom control.    Final Clinical Impression(s) / ED Diagnoses Final diagnoses:  Influenza    Rx / DC Orders ED Discharge Orders          Ordered    benzonatate (TESSALON PERLES) 100 MG capsule  Every 6 hours PRN        04/26/22 0907    ondansetron (ZOFRAN) 4 MG tablet  Every 8 hours PRN        04/26/22 0907    oseltamivir (TAMIFLU) 75 MG capsule  2 times daily        04/26/22 0907           Pearla Dubonnet, MD    Eppie Gibson, MD 04/26/22 7846     Gareth Morgan, MD 05/06/22 1439

## 2022-04-26 NOTE — Discharge Instructions (Addendum)
[name], You have the flu. We gave you a dose of steroids here to help with the cough/wheeze. I'm sending some tamiflu to your pharmacy along with some Tessalon perles (for cough) and Zofran (for nausea). I expect this will run its course on its own. If you develop a harder time breathing, please go to your doctor or come back to see [place]. Pearla Dubonnet, MD

## 2022-05-08 ENCOUNTER — Ambulatory Visit (INDEPENDENT_AMBULATORY_CARE_PROVIDER_SITE_OTHER): Payer: Medicare Other

## 2022-05-08 DIAGNOSIS — I441 Atrioventricular block, second degree: Secondary | ICD-10-CM

## 2022-05-08 LAB — CUP PACEART REMOTE DEVICE CHECK
Battery Remaining Longevity: 122 mo
Battery Remaining Percentage: 88 %
Battery Voltage: 3.02 V
Brady Statistic RV Percent Paced: 99 %
Date Time Interrogation Session: 20240110020033
Implantable Lead Connection Status: 753985
Implantable Lead Connection Status: 753985
Implantable Lead Implant Date: 20131203
Implantable Lead Implant Date: 20131203
Implantable Lead Location: 753859
Implantable Lead Location: 753860
Implantable Lead Model: 1948
Implantable Pulse Generator Implant Date: 20220412
Lead Channel Impedance Value: 700 Ohm
Lead Channel Pacing Threshold Amplitude: 0.875 V
Lead Channel Pacing Threshold Pulse Width: 0.5 ms
Lead Channel Sensing Intrinsic Amplitude: 10 mV
Lead Channel Setting Pacing Amplitude: 1.125
Lead Channel Setting Pacing Pulse Width: 0.5 ms
Lead Channel Setting Sensing Sensitivity: 4 mV
Pulse Gen Model: 2272
Pulse Gen Serial Number: 3919039

## 2022-05-13 ENCOUNTER — Emergency Department (HOSPITAL_COMMUNITY): Payer: Medicare Other

## 2022-05-13 ENCOUNTER — Encounter (HOSPITAL_COMMUNITY): Payer: Self-pay

## 2022-05-13 ENCOUNTER — Other Ambulatory Visit: Payer: Self-pay

## 2022-05-13 ENCOUNTER — Emergency Department (HOSPITAL_COMMUNITY)
Admission: EM | Admit: 2022-05-13 | Discharge: 2022-05-13 | Disposition: A | Discharge status: Home / Self Care | Payer: Medicare Other | Attending: Emergency Medicine | Admitting: Emergency Medicine

## 2022-05-13 DIAGNOSIS — M542 Cervicalgia: Secondary | ICD-10-CM | POA: Diagnosis not present

## 2022-05-13 DIAGNOSIS — R519 Headache, unspecified: Secondary | ICD-10-CM | POA: Insufficient documentation

## 2022-05-13 DIAGNOSIS — W19XXXA Unspecified fall, initial encounter: Secondary | ICD-10-CM | POA: Insufficient documentation

## 2022-05-13 DIAGNOSIS — Z7901 Long term (current) use of anticoagulants: Secondary | ICD-10-CM | POA: Insufficient documentation

## 2022-05-13 LAB — COMPREHENSIVE METABOLIC PANEL
ALT: 27 U/L (ref 0–44)
AST: 31 U/L (ref 15–41)
Albumin: 3.8 g/dL (ref 3.5–5.0)
Alkaline Phosphatase: 78 U/L (ref 38–126)
Anion gap: 12 (ref 5–15)
BUN: 17 mg/dL (ref 8–23)
CO2: 25 mmol/L (ref 22–32)
Calcium: 9.3 mg/dL (ref 8.9–10.3)
Chloride: 101 mmol/L (ref 98–111)
Creatinine, Ser: 1.47 mg/dL — ABNORMAL HIGH (ref 0.61–1.24)
GFR, Estimated: 50 mL/min — ABNORMAL LOW (ref >60)
Glucose, Bld: 104 mg/dL — ABNORMAL HIGH (ref 70–99)
Potassium: 4.7 mmol/L (ref 3.5–5.1)
Sodium: 138 mmol/L (ref 135–145)
Total Bilirubin: 1.7 mg/dL — ABNORMAL HIGH (ref 0.3–1.2)
Total Protein: 7 g/dL (ref 6.5–8.1)

## 2022-05-13 LAB — CBC
HCT: 43.6 % (ref 39.0–52.0)
Hemoglobin: 13.8 g/dL (ref 13.0–17.0)
MCH: 29.6 pg (ref 26.0–34.0)
MCHC: 31.7 g/dL (ref 30.0–36.0)
MCV: 93.6 fL (ref 80.0–100.0)
Platelets: 112 10*3/uL — ABNORMAL LOW (ref 150–400)
RBC: 4.66 MIL/uL (ref 4.22–5.81)
RDW: 15.9 % — ABNORMAL HIGH (ref 11.5–15.5)
WBC: 10.7 10*3/uL — ABNORMAL HIGH (ref 4.0–10.5)
nRBC: 0 % (ref 0.0–0.2)

## 2022-05-13 LAB — LIPASE, BLOOD: Lipase: 30 U/L (ref 11–51)

## 2022-05-13 LAB — TROPONIN I (HIGH SENSITIVITY)
Troponin I (High Sensitivity): 19 ng/L — ABNORMAL HIGH (ref <18)
Troponin I (High Sensitivity): 22 ng/L — ABNORMAL HIGH (ref <18)

## 2022-05-13 MED ORDER — OXYCODONE-ACETAMINOPHEN 5-325 MG PO TABS: 1.0000 | ORAL_TABLET | Freq: Four times a day (QID) | ORAL | 0 refills | Status: DC | PRN

## 2022-05-13 MED ORDER — OXYCODONE-ACETAMINOPHEN 5-325 MG PO TABS
2.0000 | ORAL_TABLET | Freq: Once | ORAL | Status: AC
  Administered 2022-05-13: 2 via ORAL
  Filled 2022-05-13: qty 2

## 2022-05-13 NOTE — ED Provider Notes (Signed)
[facility] DEPT Provider Note   CSN: [id number] Arrival date & time: 05/13/22  1248     History  Chief Complaint  Patient presents with   Chest Pain    [name] is a 72 y.o. male.  72 year old male who presents with head and neck pain after mechanical fall yesterday.  No LOC.  Does take Eliquis.  Denies any symptoms prior to the fall.  Has chronic pain which is unchanged.  States he has chronic chest pain but no associated cardiac symptoms with this.  Does not being short of breath.  No new hip or back pain.  No treatment use prior to arrival       Home Medications Prior to Admission medications   Medication Sig Start Date End Date Taking? Authorizing Provider  albuterol (PROVENTIL HFA;VENTOLIN HFA) 108 (90 Base) MCG/ACT inhaler Inhale 2 puffs into the lungs every 6 (six) hours as needed for wheezing or shortness of breath. 12/28/15   Collene Gobble, MD  amLODipine (NORVASC) 10 MG tablet Take 5 mg by mouth in the morning.    [provider]  amoxicillin (AMOXIL) 500 MG capsule Take 2 capsules (1,000 mg total) by mouth 2 (two) times daily. 08/06/71   Delora Fuel, MD  apixaban (ELIQUIS) 5 MG TABS tablet Take 1 tablet (5 mg total) by mouth 2 (two) times daily. 05/11/21   Allred, Jeneen Rinks, MD  benzonatate (TESSALON PERLES) 100 MG capsule Take 1 capsule (100 mg total) by mouth every 6 (six) hours as needed for cough. 04/26/22 04/26/23  Eppie Gibson, MD  bisoprolol (ZEBETA) 5 MG tablet Take 1 tablet (5 mg total) by mouth daily. Needs appt 11/19/21   Bensimhon, Shaune Pascal, MD  Budeson-Glycopyrrol-Formoterol (BREZTRI AEROSPHERE) 160-9-4.8 MCG/ACT AERO Inhale 2 puffs into the lungs in the morning and at bedtime. 11/30/20   Martyn Ehrich, NP  Calcium-Magnesium-Zinc (CAL-MAG-ZINC PO) Take 1 tablet by mouth at bedtime.    [provider]  Cholecalciferol (VITAMIN D3 SUPER STRENGTH) 50 MCG (2000 UT) CAPS Take 8,000 Units by mouth at  bedtime.    [provider]  doxycycline (VIBRA-TABS) 100 MG tablet Take 1 tablet (100 mg total) by mouth 2 (two) times daily. 04/03/22   Wallene Huh, DPM  empagliflozin (JARDIANCE) 10 MG TABS tablet Take 1 tablet (10 mg total) by mouth daily before breakfast. Patient taking differently: Take 10 mg by mouth at bedtime. 03/14/20   Bensimhon, Shaune Pascal, MD  fexofenadine (ALLEGRA) 180 MG tablet Take 1 tablet (180 mg total) by mouth daily as needed for allergies or rhinitis. 01/23/20   Aline August, MD  gabapentin (NEURONTIN) 300 MG capsule Take 600-1,200 mg by mouth See admin instructions. Take 600 mg by mouth in the morning, 1,200 mg at bedtime, and an additional 600 mg once a day as needed for neuropathy 09/13/14   [provider]  levothyroxine (SYNTHROID, LEVOTHROID) 175 MCG tablet Take 175 mcg by mouth daily before breakfast.    [provider]  LORazepam (ATIVAN) 1 MG tablet Take 1 mg by mouth at bedtime. 01/10/22   [provider]  Menthol-Camphor (TIGER BALM ARTHRITIS RUB EX) Apply 1 application topically as needed (for leg pain).    [provider]  montelukast (SINGULAIR) 10 MG tablet TAKE 1 TABLET(10 MG) BY MOUTH AT BEDTIME Patient taking differently: Take 10 mg by mouth at bedtime. 09/18/21   Collene Gobble, MD  Multiple Vitamin (MULTIVITAMIN WITH MINERALS) TABS tablet Take 1 tablet  by mouth daily. 12/15/19   Sheikh, Omair Latif, DO  ondansetron (ZOFRAN) 4 MG tablet Take 4 mg by mouth daily. 01/03/22   [provider]  ondansetron (ZOFRAN) 4 MG tablet Take 1 tablet (4 mg total) by mouth every 8 (eight) hours as needed for nausea or vomiting. 04/26/22   Eppie Gibson, MD  ondansetron (ZOFRAN-ODT) 4 MG disintegrating tablet Take 1 tablet (4 mg total) by mouth every 8 (eight) hours as needed for nausea or vomiting. 8/46/96   Delora Fuel, MD  oxyCODONE-acetaminophen (PERCOCET/ROXICET) 5-325 MG tablet Take 1 tablet by mouth 2 (two) times  daily as needed for moderate pain or severe pain.    [provider]  OXYGEN Inhale 2 L/min into the lungs See admin instructions. 2 L/min at bedtime and 2 L/min throughout the day as needed for shortness of breath    [provider]  oxymetazoline (AFRIN) 0.05 % nasal spray Place 1 spray into both nostrils as needed (for nose bleeds).    [provider]  pantoprazole (PROTONIX) 40 MG tablet Take 40 mg by mouth daily before breakfast.    [provider]  polyethylene glycol powder (GLYCOLAX/MIRALAX) 17 GM/SCOOP powder Take 17 g by mouth daily as needed for mild constipation. 01/23/20   Aline August, MD  potassium chloride (KLOR-CON M) 10 MEQ tablet Take 10 mEq by mouth every other day. Take along with Torsemide (30 mg)    [provider]  predniSONE (DELTASONE) 5 MG tablet Take 0.5 tablets (2.5 mg total) by mouth daily with breakfast. 10/22/21   Byrum, Rose Fillers, MD  rOPINIRole (REQUIP) 1 MG tablet Take 1 tablet (1 mg total) by mouth in the morning and at bedtime. 01/23/20   Aline August, MD  rosuvastatin (CRESTOR) 10 MG tablet Take 10 mg by mouth at bedtime.  08/08/16   [provider]  senna (SENOKOT) 8.6 MG TABS tablet Take 1 tablet (8.6 mg total) by mouth 2 (two) times daily. 01/02/22   Hosie Poisson, MD  spironolactone (ALDACTONE) 25 MG tablet Take 1 tablet (25 mg total) by mouth daily. 02/16/16   Shirley Friar, PA-C  SYSTANE ULTRA PF 0.4-0.3 % SOLN Apply 1 drop to eye 3 (three) times daily as needed (for dryness).    [provider]  torsemide (DEMADEX) 20 MG tablet Take 30 mg by mouth every other day. Take along with Potassium (10 meq)    [provider]  traMADol (ULTRAM) 50 MG tablet Take 100 mg by mouth 2 (two) times daily. 10/26/20   [provider]  traZODone (DESYREL) 50 MG tablet Take 50 mg by mouth at bedtime. 08/30/20   [provider]      Allergies    Meloxicam and Vancomycin    Review  of Systems   Review of Systems  All other systems reviewed and are negative.   Physical Exam Updated Vital Signs BP 129/80   Pulse 80   Temp 98.4 F (36.9 C) (Oral)   Resp 19   SpO2 93%  Physical Exam Vitals and nursing note reviewed.  Constitutional:      General: He is not in acute distress.    Appearance: Normal appearance. He is well-developed. He is not toxic-appearing.  HENT:     Head: Normocephalic and atraumatic.  Eyes:     General: Lids are normal.     Conjunctiva/sclera: Conjunctivae normal.     Pupils: Pupils are equal, round, and reactive to light.  Neck:  Thyroid: No thyroid mass.     Trachea: No tracheal deviation.   Cardiovascular:     Rate and Rhythm: Normal rate and regular rhythm.     Heart sounds: Normal heart sounds. No murmur heard.    No gallop.  Pulmonary:     Effort: Pulmonary effort is normal. No respiratory distress.     Breath sounds: Normal breath sounds. No stridor. No decreased breath sounds, wheezing, rhonchi or rales.  Abdominal:     General: There is no distension.     Palpations: Abdomen is soft.     Tenderness: There is no abdominal tenderness. There is no rebound.  Musculoskeletal:        General: No tenderness. Normal range of motion.     Cervical back: Normal range of motion and neck supple. Muscular tenderness present. No spinous process tenderness.  Skin:    General: Skin is warm and dry.     Findings: No abrasion or rash.  Neurological:     General: No focal deficit present.     Mental Status: He is alert and oriented to person, place, and time. Mental status is at baseline.     GCS: GCS eye subscore is 4. GCS verbal subscore is 5. GCS motor subscore is 6.     Cranial Nerves: No cranial nerve deficit.     Sensory: No sensory deficit.     Motor: Motor function is intact.     Comments: Strength is 5 of 5  Psychiatric:        Attention and Perception: Attention normal.        Speech: Speech normal.        Behavior:  Behavior normal.     ED Results / Procedures / Treatments   Labs (all labs ordered are listed, but only abnormal results are displayed) Labs Reviewed  CBC - Abnormal; Notable for the following components:      Result Value   WBC 10.7 (*)    RDW 15.9 (*)    Platelets 112 (*)    All other components within normal limits  COMPREHENSIVE METABOLIC PANEL - Abnormal; Notable for the following components:   Glucose, Bld 104 (*)    Creatinine, Ser 1.47 (*)    Total Bilirubin 1.7 (*)    GFR, Estimated 50 (*)    All other components within normal limits  TROPONIN I (HIGH SENSITIVITY) - Abnormal; Notable for the following components:   Troponin I (High Sensitivity) 19 (*)    All other components within normal limits  LIPASE, BLOOD  TROPONIN I (HIGH SENSITIVITY)    EKG EKG Interpretation  Date/Time:  Monday May 13 2022 13:05:28 EST Ventricular Rate:  60 PR Interval:    QRS Duration: 176 QT Interval:  450 QTC Calculation: 450 R Axis:   -72 Text Interpretation: Junctional rhythm LVH with IVCD, LAD and secondary repol abnrm Probable inferior infarct, recent No significant change since last tracing Confirmed by Lacretia Leigh ([id number]) on 05/13/2022 3:06:42 PM  Radiology CT Head Wo Contrast  Result Date: 05/13/2022 CLINICAL DATA:  Fall last night injuring back of head. Sided neck pain. Patient on Eliquis. EXAM: CT HEAD WITHOUT CONTRAST CT CERVICAL SPINE WITHOUT CONTRAST TECHNIQUE: Multidetector CT imaging of the head and cervical spine was performed following the standard protocol without intravenous contrast. Multiplanar CT image reconstructions of the cervical spine were also generated. RADIATION DOSE REDUCTION: This exam was performed according to the departmental dose-optimization program which includes automated exposure control, adjustment of the mA  and/or kV according to patient size and/or use of iterative reconstruction technique. COMPARISON:  06/09/2021 FINDINGS: CT HEAD FINDINGS  Brain: Ventricles, cisterns and other CSF spaces are normal. There is minimal chronic ischemic microvascular disease. There is no mass, mass effect, shift of midline structures or acute hemorrhage. No acute infarction. Vascular: No hyperdense vessel or unexpected calcification. Skull: Normal. Negative for fracture or focal lesion. Sinuses/Orbits: No acute finding. Other: None. CT CERVICAL SPINE FINDINGS Alignment: Within normal without posttraumatic subluxation. Skull base and vertebrae: Vertebral body heights are normal. There is mild spondylosis of the cervical spine to include facet arthropathy and uncovertebral joint spurring. Atlantoaxial articulation is normal. Minimal left-sided neural foraminal narrowing at the C2-3 level and C3-4 levels. Bilateral neural foraminal narrowing at the C4-5 level with mild right-sided neural from narrowing at the C5-6 level. No acute fracture. Soft tissues and spinal canal: No prevertebral fluid or swelling. No visible canal hematoma. Minimal narrowing of the AP diameter of the spinal canal at the C5-6 and C6-7 levels. Disc levels:  Disc space narrowing at the C5-6 level. Upper chest: No acute findings. Other: None. IMPRESSION: 1. No acute brain injury. 2. Minimal chronic ischemic microvascular disease. 3. No acute cervical spine injury. 4. Mild spondylosis of the cervical spine with disc disease at the C5-6 level. Multilevel neural foraminal narrowing as described. Electronically Signed   By: Marin Olp M.D.   On: 05/13/2022 14:42   CT Cervical Spine Wo Contrast  Result Date: 05/13/2022 CLINICAL DATA:  Fall last night injuring back of head. Sided neck pain. Patient on Eliquis. EXAM: CT HEAD WITHOUT CONTRAST CT CERVICAL SPINE WITHOUT CONTRAST TECHNIQUE: Multidetector CT imaging of the head and cervical spine was performed following the standard protocol without intravenous contrast. Multiplanar CT image reconstructions of the cervical spine were also generated. RADIATION  DOSE REDUCTION: This exam was performed according to the departmental dose-optimization program which includes automated exposure control, adjustment of the mA and/or kV according to patient size and/or use of iterative reconstruction technique. COMPARISON:  06/09/2021 FINDINGS: CT HEAD FINDINGS Brain: Ventricles, cisterns and other CSF spaces are normal. There is minimal chronic ischemic microvascular disease. There is no mass, mass effect, shift of midline structures or acute hemorrhage. No acute infarction. Vascular: No hyperdense vessel or unexpected calcification. Skull: Normal. Negative for fracture or focal lesion. Sinuses/Orbits: No acute finding. Other: None. CT CERVICAL SPINE FINDINGS Alignment: Within normal without posttraumatic subluxation. Skull base and vertebrae: Vertebral body heights are normal. There is mild spondylosis of the cervical spine to include facet arthropathy and uncovertebral joint spurring. Atlantoaxial articulation is normal. Minimal left-sided neural foraminal narrowing at the C2-3 level and C3-4 levels. Bilateral neural foraminal narrowing at the C4-5 level with mild right-sided neural from narrowing at the C5-6 level. No acute fracture. Soft tissues and spinal canal: No prevertebral fluid or swelling. No visible canal hematoma. Minimal narrowing of the AP diameter of the spinal canal at the C5-6 and C6-7 levels. Disc levels:  Disc space narrowing at the C5-6 level. Upper chest: No acute findings. Other: None. IMPRESSION: 1. No acute brain injury. 2. Minimal chronic ischemic microvascular disease. 3. No acute cervical spine injury. 4. Mild spondylosis of the cervical spine with disc disease at the C5-6 level. Multilevel neural foraminal narrowing as described. Electronically Signed   By: Marin Olp M.D.   On: 05/13/2022 14:42   US Abdomen Limited RUQ (LIVER/GB)  Result Date: 05/13/2022 CLINICAL DATA:  Back pain post fall EXAM: ULTRASOUND ABDOMEN LIMITED RIGHT  UPPER QUADRANT  COMPARISON:  CT 01/26/2022 FINDINGS: Gallbladder: No gallstones or wall thickening visualized. Gallbladder wall thickening up to 5 mm. No sonographic Murphy sign noted by sonographer. Common bile duct: Diameter: 4 mm, unremarkable. No intrahepatic biliary ductal dilatation identified. Liver: No focal lesion identified. Within normal limits in parenchymal echogenicity. Portal vein is patent on color Doppler imaging with normal direction of blood flow towards the liver. IMPRESSION: Nonspecific gallbladder wall thickening without gallstones or other findings of acute cholecystitis. Electronically Signed   By: Lucrezia Europe M.D.   On: 05/13/2022 14:36   DG Chest 2 View  Result Date: 05/13/2022 CLINICAL DATA:  Chest pain. EXAM: CHEST - 2 VIEW COMPARISON:  04/26/2022 FINDINGS: The lungs are clear without focal pneumonia, edema, pneumothorax or pleural effusion. The cardiopericardial silhouette is within normal limits for size. 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 IMPRESSION: No active cardiopulmonary disease. Electronically Signed   By: Misty Stanley M.D.   On: 05/13/2022 13:37    Procedures Procedures    Medications Ordered in ED Medications  oxyCODONE-acetaminophen (PERCOCET/ROXICET) 5-325 MG per tablet 2 tablet (has no administration in time range)    ED Course/ Medical Decision Making/ A&P                             Medical Decision Making Amount and/or Complexity of Data Reviewed Labs: ordered. Radiology: ordered.  Risk Prescription drug management.   Patient is EKG per interpretation shows no signs of acute ischemic changes.  Patient had history of traumatic fall and is on Eliquis and concern for possible intracranial bleed.  He also claims neck discomfort 2.  Head and neck CT per my interpretation are without acute abnormalities.  Patient did have some abdominal comfort last night without associated fever or chills or vomiting.  Pain was localized  to his right upper quadrant concern for possible gallbladder etiology and patient had abdominal ultrasound which was negative per my review.  Due to the patient's symptoms of diffuse pain involving the chest he did have troponins x 2 which were negative.  Was medicated for exam and feels much better at this time.  Patient states that he has been having trouble with chronic pain for some time.  Scheduled see his doctor in 2 days for this.  Patient's wife is at bedside.  States that he is at his baseline at this time.  We discharged home        Final Clinical Impression(s) / ED Diagnoses Final diagnoses:  None    Rx / DC Orders ED Discharge Orders     None         Lacretia Leigh, MD 05/13/22 [phone]

## 2022-05-13 NOTE — ED Provider Triage Note (Addendum)
Emergency Medicine Provider Triage Evaluation Note  [name] , a 72 y.o. male  was evaluated in triage.  Pt here with multiple complaints. States he fell from standing yesterday and has not felt at his baseline since that time. He did hit back of his head, reports only mild bleeding. Anticoagulated on Eliquis for a-fib. Reports since that time having chest pain, headache, blurry vision, shortness of breath, and abdominal pain. Hx COPD. Pt/wife deny prior abdominal surgeries.   Review of Systems  Positive: See HPI Negative: See HPI  Physical Exam  BP 129/80   Pulse 80   Temp 98.4 F (36.9 C) (Oral)   Resp 19   SpO2 93%  Gen:   Awake, no distress   Resp:  Normal effort, LCTA MSK:   Moves extremities without difficulty  Other:  RRR, cabbed over laceration to scalp, no active bleeding, localized tenderness; moderate RUQ tenderness, positive Murphy's sign, remainder of abdomen soft and non-tender, no CTL spine tenderness, no chest wall tenderness or obvious deformities, 5/5 strength to  bilateral UE and LE, CNI  Medical Decision Making  Medically screening exam initiated at 1:18 PM.  Appropriate orders placed.  [name] was informed that the remainder of the evaluation will be completed by another provider, this initial triage assessment does not replace that evaluation, and the importance of remaining in the ED until their evaluation is complete.     Suzzette Righter, PA-C 05/13/22 1321    Suzzette Righter, PA-C 05/13/22 1328    GowensArvella Merles, PA-C 05/13/22 1528

## 2022-05-13 NOTE — ED Triage Notes (Addendum)
Patient said his chest hurts and he is short of breath. Fell last night and hit his head. Takes eloquis. Has heart failure, a fib, pacemaker.

## 2022-05-23 ENCOUNTER — Encounter: Payer: Self-pay | Admitting: Emergency Medicine

## 2022-05-23 ENCOUNTER — Ambulatory Visit: Payer: Medicare Other | Admitting: Emergency Medicine

## 2022-05-23 VITALS — BP 116/62 | HR 61 | Ht 71.0 in | Wt 170.6 lb

## 2022-05-23 DIAGNOSIS — J449 Chronic obstructive pulmonary disease, unspecified: Secondary | ICD-10-CM

## 2022-05-23 DIAGNOSIS — J309 Allergic rhinitis, unspecified: Secondary | ICD-10-CM

## 2022-05-23 DIAGNOSIS — J9611 Chronic respiratory failure with hypoxia: Secondary | ICD-10-CM | POA: Diagnosis not present

## 2022-05-23 MED ORDER — BREZTRI AEROSPHERE 160-9-4.8 MCG/ACT IN AERO: 2.0000 | INHALATION_SPRAY | Freq: Two times a day (BID) | RESPIRATORY_TRACT | 3 refills | Status: DC

## 2022-05-23 NOTE — Patient Instructions (Addendum)
Please continue Breztri 2 puffs twice a day.  Rinse and gargle after using. Keep your albuterol available to use 2 puffs up to every 4 hours if needed for shortness of breath, chest tightness, wheezing.  We will refill this for you today. Continue your prednisone 2.5 mg daily Wear your oxygen at night while sleeping. Try to wear your oxygen reliably when you are up exerting yourself. Continue Allegra and Singulair as you have been taking them Follow with APP in 6 months Follow Dr. Lamonte Sakai in 1 year or sooner if you have any problems.

## 2022-05-23 NOTE — Addendum Note (Signed)
Addended by: Gavin Potters R on: 05/23/2022 11:37 AM   Modules accepted: Orders

## 2022-05-23 NOTE — Progress Notes (Signed)
Subjective:    Patient ID: [name], male    DOB: Jun 18, 1950, 72 y.o.   MRN: [id number]  Asthma He complains of cough and shortness of breath. There is no wheezing. His past medical history is significant for asthma.    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  He has associated secondary pulmonary hypertension.  I last saw him in November 2017, last seen in our office 11/2020. PMH: Atrial fibrillation/maze and history of DC cardioversion, AVR Since I last saw him he was noted to have renal cell carcinoma, underwent ablation in IR 01/2021 Currently managed on Breztri, prednisone 2.5 mg daily.  Albuterol use a couple times a month.  He is using O2 with sleeping, but has not used regularly w exertion. He does not believe he is desaturating. Remains active, works outside. Wife believes that he may be desaturating, under-using his o2.  Singulair and allegra, protonix qd  ROV 05/23/22 --follow-up visit 72 year old man with history of CAD, atrial fibrillation/maze and cardioversions, AVR.  Also with COPD/mixed asthma, renal cell carcinoma (ablation 01/2021).  He has oxygen, uses at night, less apt to use it during the day although he has documented desaturations.  Current regimen is Breztri, prednisone 2.5 mg daily, albuterol, Allegra, Singulair. He reports that he had the flu beginning of the month, received tessalon, tamiflu, no extra pred. Using albuterol 2-3x a week, for SOB. Dyspnea w exertion esp climbing stairs. He still works outside Air traffic controller, [employer] research officer, government. Doesn't wear his O2 much during the day   Review of Systems  Constitutional: Negative.   HENT:  Positive for congestion.   Respiratory:  Positive for cough and shortness of breath. Negative for chest tightness and wheezing.   Cardiovascular: Negative.   Gastrointestinal: Negative.   Endocrine: Negative.   Genitourinary:  Negative.   Musculoskeletal: Negative.   Allergic/Immunologic: Negative.   Hematological: Negative.   All other systems reviewed and are negative.    Objective:   Physical Exam  Vitals:  Vitals:   05/23/22 1106  BP: 116/62  Pulse: 61  SpO2: 95%  Weight: 170 lb 9.6 oz (77.4 kg)  Height: '5\' 11"'$  (1.803 m)    Body mass index is 23.79 kg/m. Wt Readings from Last 3 Encounters:  05/23/22 170 lb 9.6 oz (77.4 kg)  04/26/22 175 lb (79.4 kg)  01/25/22 175 lb (79.4 kg)   Gen: Pleasant, well-nourished, in no distress,  normal affect, on room air  ENT: No lesions,  mouth clear,  oropharynx clear, no postnasal drip  Neck: No JVD, no stridor  Lungs: No use of accessory muscles, distant, no wheeze  Cardiovascular: RRR, heart sounds normal, no murmur or gallops, no peripheral edema  Musculoskeletal: No deformities, no cyanosis or clubbing  Neuro: alert, non focal  Skin: Warm, no lesions or rashes       Assessment & Plan:  COPD mixed type ([place]) The beginning of this month but did not have to be treated with prednisone.  Did get Tamiflu.  He is improved.  Plan to continue same regimen.  Do not believe he is in a position to come off low-dose prednisone.  Please continue Breztri 2 puffs twice a day.  Rinse and gargle after using. Keep your albuterol available to use 2 puffs up to every 4 hours if needed for shortness of breath, chest tightness, wheezing.  We will refill this for you today. Continue your prednisone  2.5 mg daily Follow with APP in 6 months Follow Dr. Lamonte Sakai in 1 year or sooner if you have any problems.  Chronic respiratory failure with hypoxia (HCC) Oxygen reliably at night but still poor compliance with exertion during the day.  Did discuss this with him and encouraged him to use more reliably.  He has an oximeter.  Asked him to use this and evaluate his level of exertion, work to keep his SpO2 greater than 90%.  Allergic rhinitis Continue Allegra and Singulair  as you have been taking them    Baltazar Apo, MD, PhD 05/23/2022, 11:34 AM [facility] [phone] or if no answer [phone]

## 2022-05-23 NOTE — Assessment & Plan Note (Signed)
The beginning of this month but did not have to be treated with prednisone.  Did get Tamiflu.  He is improved.  Plan to continue same regimen.  Do not believe he is in a position to come off low-dose prednisone.  Please continue Breztri 2 puffs twice a day.  Rinse and gargle after using. Keep your albuterol available to use 2 puffs up to every 4 hours if needed for shortness of breath, chest tightness, wheezing.  We will refill this for you today. Continue your prednisone 2.5 mg daily Follow with APP in 6 months Follow Dr. Lamonte Sakai in 1 year or sooner if you have any problems.

## 2022-05-23 NOTE — Assessment & Plan Note (Signed)
Oxygen reliably at night but still poor compliance with exertion during the day.  Did discuss this with him and encouraged him to use more reliably.  He has an oximeter.  Asked him to use this and evaluate his level of exertion, work to keep his SpO2 greater than 90%.

## 2022-05-23 NOTE — Assessment & Plan Note (Signed)
Continue Allegra and Singulair as you have been taking them

## 2022-05-27 ENCOUNTER — Other Ambulatory Visit (HOSPITAL_COMMUNITY): Payer: Self-pay | Admitting: Pharmacist

## 2022-05-27 MED ORDER — EMPAGLIFLOZIN 10 MG PO TABS: 10.0000 mg | ORAL_TABLET | Freq: Every day | ORAL | 6 refills | Status: DC

## 2022-05-29 NOTE — Progress Notes (Signed)
Remote pacemaker transmission.   

## 2022-05-30 ENCOUNTER — Other Ambulatory Visit: Payer: Self-pay | Admitting: Interventional Radiology

## 2022-05-30 DIAGNOSIS — C642 Malignant neoplasm of left kidney, except renal pelvis: Secondary | ICD-10-CM

## 2022-06-06 ENCOUNTER — Encounter (HOSPITAL_COMMUNITY): Payer: Self-pay | Admitting: *Deleted

## 2022-06-11 ENCOUNTER — Ambulatory Visit (INDEPENDENT_AMBULATORY_CARE_PROVIDER_SITE_OTHER): Payer: Medicare Other | Admitting: Family

## 2022-06-11 ENCOUNTER — Encounter: Payer: Self-pay | Admitting: Family

## 2022-06-11 DIAGNOSIS — L97511 Non-pressure chronic ulcer of other part of right foot limited to breakdown of skin: Secondary | ICD-10-CM | POA: Diagnosis not present

## 2022-06-11 NOTE — Progress Notes (Signed)
Office Visit Note   Patient: [name]           Date of Birth: Jan 27, 1951           MRN: [id number] Visit Date: 06/11/2022              Requested by: Velna Hatchet, MD [address] [place],  [place] [address] PCP: Velna Hatchet, MD  Chief Complaint  Patient presents with   Right Foot - Pain      HPI: Patient is a 72 year old gentleman seen today for concern of right foot pain beneath the lateral aspect of the right foot.  States has had a wound for greater than 2 months to his fifth metatarsal head he has has seen podiatry for the same.  Had radiographs at the time which were not concerning.  Radiographs did show hammertoes and bunionette deformity on the right as well as multiple corns and calluses.  Assessment & Plan: Visit Diagnoses:  1. Right foot ulcer, limited to breakdown of skin ([place])     Plan: Will modify shoewear discussed cutting out beneath the great toe past the first metatarsal head on the right to offload the ulcerated area.  Patient understands.  Callus and ulcer parred today down to viable tissue.  Follow-Up Instructions: Return in about 3 weeks (around 07/02/2022).   Ortho Exam  Patient is alert, oriented, no adenopathy, well-dressed, normal affect, normal respiratory effort. On examination of the right foot the patient has a plantarflexed first ray on the right.  There is callused ulceration beneath the fifth metatarsal head this was debrided with a 10 blade knife back to viable tissue there is underlying 1 cm in diameter ulcer this is 2 mm deep there is no drainage no erythema no warmth this does not probe to bone or tendon.  Imaging: No results found. No images are attached to the encounter.  Labs: Lab Results  Component Value Date   HGBA1C 6.5 (H) 12/27/2021   HGBA1C 6.6 (H) 06/11/2021   HGBA1C 6.7 (H) 02/02/2021   ESRSEDRATE 60 (H) 09/04/2015   REPTSTATUS 01/31/2022 FINAL 01/25/2022   GRAMSTAIN  09/04/2015    RARE WBC PRESENT,  PREDOMINANTLY PMN NO ORGANISMS SEEN    CULT  01/25/2022    NO GROWTH 5 DAYS Performed at [facility], [place] [address] [place], [place] [address]    LABORGA ESCHERICHIA COLI (A) 12/12/2019   LABORGA ENTEROCOCCUS FAECALIS (A) 12/12/2019     Lab Results  Component Value Date   ALBUMIN 3.8 05/13/2022   ALBUMIN 3.9 01/28/2022   ALBUMIN 4.1 01/25/2022    Lab Results  Component Value Date   MG 2.0 06/11/2021   MG 2.3 12/10/2020   MG 2.4 05/29/2020   No results found for: "VD25OH"  No results found for: "PREALBUMIN"    Latest Ref Rng & Units 05/13/2022    1:00 PM 04/26/2022    4:47 AM 01/28/2022   11:44 AM  CBC EXTENDED  WBC 4.0 - 10.5 K/uL 10.7  6.6  9.5   RBC 4.22 - 5.81 MIL/uL 4.66  5.37  4.58   Hemoglobin 13.0 - 17.0 g/dL 13.8  15.9  13.5   HCT 39.0 - 52.0 % 43.6  49.3  41.4   Platelets 150 - 400 K/uL 112  124  175   NEUT# 1.7 - 7.7 K/uL   8.1   Lymph# 0.7 - 4.0 K/uL   0.7      There is no height or weight  on file to calculate BMI.  Orders:  No orders of the defined types were placed in this encounter.  No orders of the defined types were placed in this encounter.    Procedures: No procedures performed  Clinical Data: No additional findings.  ROS:  All other systems negative, except as noted in the HPI. Review of Systems  Objective: Vital Signs: There were no vitals taken for this visit.  Specialty Comments:  No specialty comments available.  PMFS History: Patient Active Problem List   Diagnosis Date Noted   CAP (community acquired pneumonia) 12/28/2021   Constipation 12/28/2021   Pain in both lower extremities 07/03/2021   Peripheral neuropathy [id number]   Acute metabolic encephalopathy [id number]   Marijuana dependence ([place]) 06/09/2021   DNR (do not resuscitate) 06/09/2021   Chest pain 06/08/2021   Renal lesion 02/14/2021   Status post cryoablation 02/14/2021   Allergic rhinitis 12/08/2020   Preop pulmonary/respiratory exam  03/27/2020   Acute on chronic diastolic (congestive) heart failure ([place]) 01/22/2020   Abdominal pain 01/22/2020   Hypothyroidism 12/13/2019   DM2 (diabetes mellitus, type 2) ([place]) 12/13/2019   Chronic kidney disease, stage 3b ([place]) 12/13/2019   Acute bacterial bronchitis 12/13/2019   COPD mixed type ([place]) 10/16/2019   Typical atrial flutter (HCC)    Chronic respiratory failure with hypoxia ([place]) 05/23/2016   CAD (coronary artery disease) 05/23/2016   Persistent atrial fibrillation (HCC)    Atypical atrial flutter ([place])    Visit for monitoring Tikosyn therapy 04/16/2016   Chronic diastolic CHF (congestive heart failure) ([place]) 02/03/2016   Cough 12/06/2015   Acute on chronic respiratory failure with hypoxia ([place]) 09/08/2015   Hallucinations 09/04/2015   Lymphadenopathy 09/04/2015   Ascending aortic aneurysm ([place]) 09/04/2015   Hypoxia 09/02/2015   S/P aortic valve replacement with bioprosthetic valve 09/02/2015   Pleural effusion 09/02/2015   S/P AVR 08/22/2015   GERD without esophagitis 09/20/2014   Intrinsic asthma 10/13/2013   Dyspnea 09/15/2013   Pulmonary hypertension ([place]) 09/15/2013   Shortness of breath 05/05/2013   Pacemaker-St.Jude 04/01/2012   Bradycardia 03/31/2012   Second degree AV block 03/31/2012   AV block, 1st degree 02/01/2012   PAF (paroxysmal atrial fibrillation) ([place]) 11/13/2011   Fatigue 11/13/2011   Hypertension 11/13/2011   Aortic stenosis 11/13/2011   Sleep apnea 11/13/2011   Past Medical History:  Diagnosis Date   Aortic stenosis, mild    Arthritis of hand    "just a little bit in both hands" (03/31/2012)   Asthma    "little bit" (03/31/2012)   Atrial fibrillation ([place])    dx '04; DCCV '04, placed on flecainide, failed DCCV 04/2010, flecainide stopped; s/p successful A.fib ablation 01/31/12   CHF (congestive heart failure) ([place])    Controlled type 2 diabetes mellitus without complication, without long-term current use of insulin ([place]) 12/13/2019    COPD (chronic obstructive pulmonary disease) (HCC)    DJD (degenerative joint disease)    Fibromyalgia    GERD (gastroesophageal reflux disease)    Hyperlipidemia    Hypertension    Hypothyroidism    S/P radiation   Left atrial enlargement    LA size 37m by echo 11/21/11   Mitral regurgitation    trivial   Obstructive sleep apnea    mild by sleep study 2013; pt stated he does not have a machine because "it wasn't bad enough for him to have one"   Pacemaker 03/31/2012   Restless leg syndrome    Second degree AV block  Splenomegaly     Family History  Problem Relation Age of Onset   Heart failure Mother    Stroke Father    Neuropathy Neg Hx     Past Surgical History:  Procedure Laterality Date   AORTIC VALVE REPLACEMENT N/A 08/22/2015   Procedure: AORTIC VALVE REPLACEMENT (AVR);  Surgeon: Ivin Poot, MD;  Location: [facility];  Service: Open Heart Surgery;  Laterality: N/A;   ATRIAL FIBRILLATION ABLATION  01/30/2012   PVI by Dr. Rayann Heman   ATRIAL FIBRILLATION ABLATION N/A 01/31/2012   Procedure: ATRIAL FIBRILLATION ABLATION;  Surgeon: Thompson Grayer, MD;  Location: [facility] CATH LAB;  Service: Cardiovascular;  Laterality: N/A;   ATRIAL FIBRILLATION ABLATION N/A 04/19/2020   Procedure: ATRIAL FIBRILLATION ABLATION;  Surgeon: Thompson Grayer, MD;  Location: [place] CV LAB;  Service: Cardiovascular;  Laterality: N/A;   BACK SURGERY     X 3   BIOPSY  01/01/2022   Procedure: BIOPSY;  Surgeon: Otis Brace, MD;  Location: [facility]OS[facility];  Service: Gastroenterology;;   CARDIAC CATHETERIZATION N/A 08/09/2015   Procedure: Right/Left Heart Cath and Coronary Angiography;  Surgeon: Peter M Martinique, MD;  Location: [place] CV LAB;  Service: Cardiovascular;  Laterality: N/A;   CARDIAC CATHETERIZATION N/A 02/05/2016   Procedure: Right/Left Heart Cath and Coronary Angiography;  Surgeon: Jettie Booze, MD;  Location: [place] CV LAB;  Service: Cardiovascular;  Laterality: N/A;   CARDIAC  CATHETERIZATION N/A 04/17/2016   Procedure: Right Heart Cath;  Surgeon: Jolaine Artist, MD;  Location: [place] CV LAB;  Service: Cardiovascular;  Laterality: N/A;   CARDIOVASCULAR STRESS TEST  03/12/2002   EF 48%, NO EVIDENCE OF ISCHEMIA   CARDIOVERSION  01/2012; 03/31/2012   CARDIOVERSION N/A 02/25/2012   Procedure: CARDIOVERSION;  Surgeon: Thompson Grayer, MD;  Location: [facility] CATH LAB;  Service: Cardiovascular;  Laterality: N/A;   CARDIOVERSION N/A 03/31/2012   Procedure: CARDIOVERSION;  Surgeon: Thompson Grayer, MD;  Location: [place] CATH LAB;  Service: Cardiovascular;  Laterality: N/A;   CARDIOVERSION N/A 11/23/2015   Procedure: CARDIOVERSION;  Surgeon: Larey Dresser, MD;  Location: [place];  Service: Cardiovascular;  Laterality: N/A;   CARDIOVERSION N/A 04/08/2016   Procedure: CARDIOVERSION;  Surgeon: Jolaine Artist, MD;  Location: [place];  Service: Cardiovascular;  Laterality: N/A;   CARDIOVERSION N/A 09/14/2018   Procedure: CARDIOVERSION;  Surgeon: Josue Hector, MD;  Location: [place];  Service: Cardiovascular;  Laterality: N/A;   CARDIOVERSION N/A 10/19/2019   Procedure: CARDIOVERSION;  Surgeon: Jolaine Artist, MD;  Location: [facility] ENDOSCOPY;  Service: Cardiovascular;  Laterality: N/A;   CARDIOVERSION N/A 02/25/2020   Procedure: CARDIOVERSION;  Surgeon: Fay Records, MD;  Location: [place];  Service: Cardiovascular;  Laterality: N/A;   CARDIOVERSION N/A 05/25/2020   Procedure: CARDIOVERSION;  Surgeon: Lelon Perla, MD;  Location: [facility] ENDOSCOPY;  Service: Cardiovascular;  Laterality: N/A;   CARDIOVERSION N/A 07/10/2020   Procedure: CARDIOVERSION;  Surgeon: Jerline Pain, MD;  Location: [place];  Service: Cardiovascular;  Laterality: N/A;   CLIPPING OF ATRIAL APPENDAGE N/A 08/22/2015   Procedure: CLIPPING OF ATRIAL APPENDAGE;  Surgeon: Ivin Poot, MD;  Location: [place];  Service: Open Heart Surgery;  Laterality: N/A;   COLONOSCOPY WITH PROPOFOL  N/A 01/01/2022   Procedure: COLONOSCOPY WITH PROPOFOL;  Surgeon: Otis Brace, MD;  Location: [facility];  Service: Gastroenterology;  Laterality: N/A;   DOPPLER ECHOCARDIOGRAPHY  03/11/2002   EF 70-75%   FINGER SURGERY Left    Middle finger  FINGER TENDON REPAIR  1980's   "right little finger" (03/31/2012)   FLEXIBLE SIGMOIDOSCOPY N/A 12/30/2021   Procedure: FLEXIBLE SIGMOIDOSCOPY;  Surgeon: Wilford Corner, MD;  Location: [facility];  Service: Gastroenterology;  Laterality: N/A;   INSERT / REPLACE / REMOVE PACEMAKER     St Jude   IR RADIOLOGIST EVAL & MGMT  05/06/2019   IR RADIOLOGIST EVAL & MGMT  10/06/2019   IR RADIOLOGIST EVAL & MGMT  10/31/2020   IR RADIOLOGIST EVAL & MGMT  01/18/2021   IR RADIOLOGIST EVAL & MGMT  03/08/2021   IR RADIOLOGIST EVAL & MGMT  06/13/2021   KIDNEY SURGERY Left    LEFT AND RIGHT HEART CATHETERIZATION WITH CORONARY ANGIOGRAM N/A 10/27/2012   Procedure: LEFT AND RIGHT HEART CATHETERIZATION WITH CORONARY ANGIOGRAM;  Surgeon: Laverda Page, MD;  [facility] LAB;  Service: Cardiovascular;  Laterality: N/A;   LUMBAR DISC SURGERY  1980's X2;  2000's   MAZE N/A 08/22/2015   Procedure: MAZE;  Surgeon: Ivin Poot, MD;  Location: [place];  Service: Open Heart Surgery;  Laterality: N/A;   PACEMAKER INSERTION  03/31/2012   STJ Accent DR pacemaker implanted by Dr Rayann Heman   PERMANENT PACEMAKER INSERTION N/A 03/31/2012   Procedure: PERMANENT PACEMAKER INSERTION;  Surgeon: Thompson Grayer, MD;  Location: [place] CATH LAB;  Service: Cardiovascular;  Laterality: N/A;   PPM GENERATOR CHANGEOUT N/A 08/08/2020   Procedure: PPM GENERATOR CHANGEOUT;  Surgeon: Thompson Grayer, MD;  Location: [place] CV LAB;  Service: Cardiovascular;  Laterality: N/A;   RADIOLOGY WITH ANESTHESIA Left 02/14/2021   Procedure: RADIOLOGY WITH ANESTHESIA LEFT RENAL CRYOABLATION;  Surgeon: Aletta Edouard, MD;  Location: [facility];  Service: Radiology;  Laterality: Left;   TEE WITHOUT  CARDIOVERSION  01/30/2012   Procedure: TRANSESOPHAGEAL ECHOCARDIOGRAM (TEE);  Surgeon: Larey Dresser, MD;  Location: [place];  Service: Cardiovascular;  Laterality: N/A;  ablation next day   TEE WITHOUT CARDIOVERSION N/A 08/22/2015   Procedure: TRANSESOPHAGEAL ECHOCARDIOGRAM (TEE);  Surgeon: Ivin Poot, MD;  Location: [place] [facility];  Service: Open Heart Surgery;  Laterality: N/A;   TEE WITHOUT CARDIOVERSION N/A 10/19/2019   Procedure: TRANSESOPHAGEAL ECHOCARDIOGRAM (TEE);  Surgeon: Jolaine Artist, MD;  Location: [facility] ENDOSCOPY;  Service: Cardiovascular;  Laterality: N/A;   US ECHOCARDIOGRAPHY  08/07/2009   EF 55-60%   Social History   Occupational History   Occupation: retired  Tobacco Use   Smoking status: Former    Packs/day: 1.00    Years: 25.00    Total pack years: 25.00    Types: Cigarettes    Start date: 1963    Quit date: 07/18/1991    Years since quitting: 30.9    Passive exposure: Never   Smokeless tobacco: Never  Vaping Use   Vaping Use: Never used  Substance and Sexual Activity   Alcohol use: Not Currently    Comment: occasional   Drug use: Yes    Types: Marijuana    Comment: routine use   Sexual activity: Not Currently

## 2022-06-20 ENCOUNTER — Emergency Department (HOSPITAL_COMMUNITY): Payer: Medicare Other

## 2022-06-20 ENCOUNTER — Encounter (HOSPITAL_COMMUNITY): Payer: Medicare Other | Admitting: Internal Medicine

## 2022-06-20 ENCOUNTER — Other Ambulatory Visit: Payer: Self-pay

## 2022-06-20 ENCOUNTER — Emergency Department (HOSPITAL_COMMUNITY)
Admission: EM | Admit: 2022-06-20 | Discharge: 2022-06-20 | Disposition: A | Discharge status: Home / Self Care | Payer: Medicare Other | Attending: Emergency Medicine | Admitting: Emergency Medicine

## 2022-06-20 ENCOUNTER — Encounter (HOSPITAL_COMMUNITY): Payer: Self-pay

## 2022-06-20 DIAGNOSIS — J449 Chronic obstructive pulmonary disease, unspecified: Secondary | ICD-10-CM | POA: Diagnosis not present

## 2022-06-20 DIAGNOSIS — R062 Wheezing: Secondary | ICD-10-CM | POA: Diagnosis not present

## 2022-06-20 DIAGNOSIS — M791 Myalgia, unspecified site: Secondary | ICD-10-CM | POA: Diagnosis not present

## 2022-06-20 DIAGNOSIS — I2510 Atherosclerotic heart disease of native coronary artery without angina pectoris: Secondary | ICD-10-CM | POA: Insufficient documentation

## 2022-06-20 DIAGNOSIS — I509 Heart failure, unspecified: Secondary | ICD-10-CM | POA: Diagnosis not present

## 2022-06-20 DIAGNOSIS — R52 Pain, unspecified: Secondary | ICD-10-CM

## 2022-06-20 DIAGNOSIS — E860 Dehydration: Secondary | ICD-10-CM | POA: Diagnosis not present

## 2022-06-20 DIAGNOSIS — Z1152 Encounter for screening for COVID-19: Secondary | ICD-10-CM | POA: Diagnosis not present

## 2022-06-20 DIAGNOSIS — Z7901 Long term (current) use of anticoagulants: Secondary | ICD-10-CM | POA: Diagnosis not present

## 2022-06-20 DIAGNOSIS — R110 Nausea: Secondary | ICD-10-CM

## 2022-06-20 DIAGNOSIS — K551 Chronic vascular disorders of intestine: Secondary | ICD-10-CM | POA: Insufficient documentation

## 2022-06-20 LAB — URINALYSIS, ROUTINE W REFLEX MICROSCOPIC
Bilirubin Urine: NEGATIVE
Glucose, UA: >=500 mg/dL — AB
Hgb urine dipstick: NEGATIVE
Ketones, ur: 5 mg/dL — AB
Leukocytes,Ua: NEGATIVE
Nitrite: NEGATIVE
Protein, ur: 30 mg/dL — AB
Specific Gravity, Urine: 1.04 — ABNORMAL HIGH (ref 1.005–1.030)
pH: 7 (ref 5.0–8.0)

## 2022-06-20 LAB — COMPREHENSIVE METABOLIC PANEL
ALT: 24 U/L (ref 0–44)
AST: 29 U/L (ref 15–41)
Albumin: 4.2 g/dL (ref 3.5–5.0)
Alkaline Phosphatase: 64 U/L (ref 38–126)
Anion gap: 12 (ref 5–15)
BUN: 19 mg/dL (ref 8–23)
CO2: 25 mmol/L (ref 22–32)
Calcium: 10 mg/dL (ref 8.9–10.3)
Chloride: 102 mmol/L (ref 98–111)
Creatinine, Ser: 1.42 mg/dL — ABNORMAL HIGH (ref 0.61–1.24)
GFR, Estimated: 52 mL/min — ABNORMAL LOW (ref >60)
Glucose, Bld: 106 mg/dL — ABNORMAL HIGH (ref 70–99)
Potassium: 4.7 mmol/L (ref 3.5–5.1)
Sodium: 139 mmol/L (ref 135–145)
Total Bilirubin: 1.1 mg/dL (ref 0.3–1.2)
Total Protein: 7.2 g/dL (ref 6.5–8.1)

## 2022-06-20 LAB — TROPONIN I (HIGH SENSITIVITY)
Troponin I (High Sensitivity): 14 ng/L (ref <18)
Troponin I (High Sensitivity): 15 ng/L (ref <18)

## 2022-06-20 LAB — CBC WITH DIFFERENTIAL/PLATELET
Abs Immature Granulocytes: 0.02 10*3/uL (ref 0.00–0.07)
Basophils Absolute: 0 10*3/uL (ref 0.0–0.1)
Basophils Relative: 0 %
Eosinophils Absolute: 0 10*3/uL (ref 0.0–0.5)
Eosinophils Relative: 0 %
HCT: 45.7 % (ref 39.0–52.0)
Hemoglobin: 14.4 g/dL (ref 13.0–17.0)
Immature Granulocytes: 0 %
Lymphocytes Relative: 8 %
Lymphs Abs: 0.6 10*3/uL — ABNORMAL LOW (ref 0.7–4.0)
MCH: 30 pg (ref 26.0–34.0)
MCHC: 31.5 g/dL (ref 30.0–36.0)
MCV: 95.2 fL (ref 80.0–100.0)
Monocytes Absolute: 0.4 10*3/uL (ref 0.1–1.0)
Monocytes Relative: 5 %
Neutro Abs: 6.5 10*3/uL (ref 1.7–7.7)
Neutrophils Relative %: 87 %
Platelets: 124 10*3/uL — ABNORMAL LOW (ref 150–400)
RBC: 4.8 MIL/uL (ref 4.22–5.81)
RDW: 16.1 % — ABNORMAL HIGH (ref 11.5–15.5)
WBC: 7.6 10*3/uL (ref 4.0–10.5)
nRBC: 0 % (ref 0.0–0.2)

## 2022-06-20 LAB — RESP PANEL BY RT-PCR (RSV, FLU A&B, COVID)  RVPGX2
Influenza A by PCR: NEGATIVE
Influenza B by PCR: NEGATIVE
Resp Syncytial Virus by PCR: NEGATIVE
SARS Coronavirus 2 by RT PCR: NEGATIVE

## 2022-06-20 LAB — PROTIME-INR
INR: 1.2 (ref 0.8–1.2)
Prothrombin Time: 15.1 seconds (ref 11.4–15.2)

## 2022-06-20 LAB — LACTIC ACID, PLASMA: Lactic Acid, Venous: 1.7 mmol/L (ref 0.5–1.9)

## 2022-06-20 LAB — LIPASE, BLOOD: Lipase: 29 U/L (ref 11–51)

## 2022-06-20 LAB — APTT: aPTT: 33 seconds (ref 24–36)

## 2022-06-20 LAB — CK: Total CK: 58 U/L (ref 49–397)

## 2022-06-20 MED ORDER — ACETAMINOPHEN 500 MG PO TABS
1000.0000 mg | ORAL_TABLET | Freq: Once | ORAL | Status: AC
  Administered 2022-06-20: 1000 mg via ORAL
  Filled 2022-06-20: qty 2

## 2022-06-20 MED ORDER — SODIUM CHLORIDE 0.9 % IV BOLUS (SEPSIS)
500.0000 mL | Freq: Once | INTRAVENOUS | Status: AC
  Administered 2022-06-20: 500 mL via INTRAVENOUS

## 2022-06-20 MED ORDER — ONDANSETRON HCL 4 MG/2ML IJ SOLN
4.0000 mg | Freq: Once | INTRAMUSCULAR | Status: AC
  Administered 2022-06-20: 4 mg via INTRAVENOUS
  Filled 2022-06-20: qty 2

## 2022-06-20 MED ORDER — MORPHINE SULFATE (PF) 4 MG/ML IV SOLN
4.0000 mg | Freq: Once | INTRAVENOUS | Status: AC
  Administered 2022-06-20: 4 mg via INTRAVENOUS
  Filled 2022-06-20: qty 1

## 2022-06-20 MED ORDER — ONDANSETRON 4 MG PO TBDP: 4.0000 mg | ORAL_TABLET | Freq: Three times a day (TID) | ORAL | 0 refills | Status: DC | PRN

## 2022-06-20 MED ORDER — IOHEXOL 350 MG/ML SOLN
100.0000 mL | Freq: Once | INTRAVENOUS | Status: AC | PRN
  Administered 2022-06-20: 100 mL via INTRAVENOUS

## 2022-06-20 NOTE — ED Notes (Signed)
This RN reviewed discharge instructions with pt. Pt concerned about right leg pain, this RN explained need for follow up with referral providers and to come back in anything gets worse. Pt well appearing upon discharge. Pt wheeled to exit by tech. Pt endorses ride home.

## 2022-06-20 NOTE — ED Notes (Signed)
NAD noted, respirations are equal bilaterally and unlabored at this time. Pt resting in gurney and denies any unmet needs. Support person at bedside.

## 2022-06-20 NOTE — ED Provider Notes (Signed)
[facility] Provider Note   CSN: [id number] Arrival date & time: 06/20/22  [id number]     History  Chief Complaint  Patient presents with   Generalized Body Aches    [name] is a 72 y.o. male.  With PMH of CAD, A-fib on Eliquis, pulmonary hypertension, COPD, CHF presenting with generalized pain with nausea but no vomiting.  Patient woke up yesterday feeling unwell.  He was complaining of pain generalized everywhere throughout his abdomen, chest tightness, some radiation into his left neck as well as some pain and cramping down in his right lower extremity.  He has had nausea and decreased appetite but no vomiting.  He has had no fevers but endorses chills at home.  Denies any pain with urination, hematuria.  Denies any diarrhea, melena or hematochezia.  He was supposed to see his PCP today but felt too unwell and came to the ER instead.  HPI     Home Medications Prior to Admission medications   Medication Sig Start Date End Date Taking? Authorizing Provider  ondansetron (ZOFRAN-ODT) 4 MG disintegrating tablet Take 1 tablet (4 mg total) by mouth every 8 (eight) hours as needed for nausea or vomiting. 06/20/22  Yes Elgie Congo, MD  albuterol (PROVENTIL HFA;VENTOLIN HFA) 108 (90 Base) MCG/ACT inhaler Inhale 2 puffs into the lungs every 6 (six) hours as needed for wheezing or shortness of breath. 12/28/15   Collene Gobble, MD  amLODipine (NORVASC) 10 MG tablet Take 5 mg by mouth in the morning.    [provider]  amoxicillin (AMOXIL) 500 MG capsule Take 2 capsules (1,000 mg total) by mouth 2 (two) times daily. Patient not taking: Reported on [id number]   Delora Fuel, MD  apixaban (ELIQUIS) 5 MG TABS tablet Take 1 tablet (5 mg total) by mouth 2 (two) times daily. 05/11/21   Allred, Jeneen Rinks, MD  benzonatate (TESSALON PERLES) 100 MG capsule Take 1 capsule (100 mg total) by mouth every 6 (six) hours as needed for  cough. Patient not taking: Reported on 05/23/2022 04/26/22 04/26/23  Eppie Gibson, MD  bisoprolol (ZEBETA) 5 MG tablet Take 1 tablet (5 mg total) by mouth daily. Needs appt 11/19/21   Bensimhon, Shaune Pascal, MD  Budeson-Glycopyrrol-Formoterol (BREZTRI AEROSPHERE) 160-9-4.8 MCG/ACT AERO Inhale 2 puffs into the lungs in the morning and at bedtime. 05/23/22   Collene Gobble, MD  Calcium-Magnesium-Zinc (CAL-MAG-ZINC PO) Take 1 tablet by mouth at bedtime.    [provider]  Cholecalciferol (VITAMIN D3 SUPER STRENGTH) 50 MCG (2000 UT) CAPS Take 8,000 Units by mouth at bedtime.    [provider]  doxycycline (VIBRA-TABS) 100 MG tablet Take 1 tablet (100 mg total) by mouth 2 (two) times daily. 04/03/22   Wallene Huh, DPM  empagliflozin (JARDIANCE) 10 MG TABS tablet Take 1 tablet (10 mg total) by mouth daily. 05/27/22   Bensimhon, Shaune Pascal, MD  fexofenadine (ALLEGRA) 180 MG tablet Take 1 tablet (180 mg total) by mouth daily as needed for allergies or rhinitis. 01/23/20   Aline August, MD  gabapentin (NEURONTIN) 300 MG capsule Take 600-1,200 mg by mouth See admin instructions. Take 600 mg by mouth in the morning, 1,200 mg at bedtime, and an additional 600 mg once a day as needed for neuropathy 09/13/14   [provider]  levothyroxine (SYNTHROID, LEVOTHROID) 175 MCG tablet Take 175 mcg by mouth daily before breakfast.    [provider]  LORazepam (ATIVAN)  1 MG tablet Take 1 mg by mouth at bedtime. 01/10/22   [provider]  Menthol-Camphor (TIGER BALM ARTHRITIS RUB EX) Apply 1 application topically as needed (for leg pain).    [provider]  montelukast (SINGULAIR) 10 MG tablet TAKE 1 TABLET(10 MG) BY MOUTH AT BEDTIME Patient taking differently: Take 10 mg by mouth at bedtime. 09/18/21   Collene Gobble, MD  Multiple Vitamin (MULTIVITAMIN WITH MINERALS) TABS tablet Take 1 tablet by mouth daily. 12/15/19   Sheikh, Omair Latif, DO  ondansetron (ZOFRAN) 4  MG tablet Take 4 mg by mouth daily. 01/03/22   [provider]  ondansetron (ZOFRAN) 4 MG tablet Take 1 tablet (4 mg total) by mouth every 8 (eight) hours as needed for nausea or vomiting. 04/26/22   Eppie Gibson, MD  oxyCODONE-acetaminophen (PERCOCET/ROXICET) 5-325 MG tablet Take 1 tablet by mouth 2 (two) times daily as needed for moderate pain or severe pain.    [provider]  oxyCODONE-acetaminophen (PERCOCET/ROXICET) 5-325 MG tablet Take 1 tablet by mouth every 6 (six) hours as needed for severe pain. 05/13/22   Lacretia Leigh, MD  OXYGEN Inhale 2 L/min into the lungs See admin instructions. 2 L/min at bedtime and 2 L/min throughout the day as needed for shortness of breath    [provider]  oxymetazoline (AFRIN) 0.05 % nasal spray Place 1 spray into both nostrils as needed (for nose bleeds).    [provider]  pantoprazole (PROTONIX) 40 MG tablet Take 40 mg by mouth daily before breakfast.    [provider]  polyethylene glycol powder (GLYCOLAX/MIRALAX) 17 GM/SCOOP powder Take 17 g by mouth daily as needed for mild constipation. 01/23/20   Aline August, MD  potassium chloride (KLOR-CON M) 10 MEQ tablet Take 10 mEq by mouth every other day. Take along with Torsemide (30 mg)    [provider]  predniSONE (DELTASONE) 5 MG tablet Take 0.5 tablets (2.5 mg total) by mouth daily with breakfast. 10/22/21   Byrum, Rose Fillers, MD  rOPINIRole (REQUIP) 1 MG tablet Take 1 tablet (1 mg total) by mouth in the morning and at bedtime. 01/23/20   Aline August, MD  rosuvastatin (CRESTOR) 10 MG tablet Take 10 mg by mouth at bedtime.  08/08/16   [provider]  senna (SENOKOT) 8.6 MG TABS tablet Take 1 tablet (8.6 mg total) by mouth 2 (two) times daily. 01/02/22   Hosie Poisson, MD  spironolactone (ALDACTONE) 25 MG tablet Take 1 tablet (25 mg total) by mouth daily. 02/16/16   Shirley Friar, PA-C  SYSTANE ULTRA PF 0.4-0.3 % SOLN Apply 1 drop  to eye 3 (three) times daily as needed (for dryness).    [provider]  torsemide (DEMADEX) 20 MG tablet Take 30 mg by mouth every other day. Take along with Potassium (10 meq)    [provider]  traMADol (ULTRAM) 50 MG tablet Take 100 mg by mouth 2 (two) times daily. 10/26/20   [provider]  traZODone (DESYREL) 50 MG tablet Take 50 mg by mouth at bedtime. 08/30/20   [provider]      Allergies    Meloxicam and Vancomycin    Review of Systems   Review of Systems  Physical Exam Updated Vital Signs BP (!) 141/80 (BP Location: Right Arm)   Pulse 60   Temp 98 F (36.7 C) (Oral)   Resp 16   Ht '5\' 11"'$  (1.803 m)   Wt 79.4 kg  SpO2 94%   BMI 24.41 kg/m  Physical Exam Constitutional: Alert and oriented.  Uncomfortable appearing lying in stretcher holding emesis bag without active retching or vomiting Eyes: Conjunctivae are normal. ENT      Head: Normocephalic and atraumatic.      Nose: No congestion.      Mouth/Throat: Mucous membranes are moist.      Neck: No stridor. Cardiovascular: S1, S2, regular rate, equal palpable radial and PT pulses, warm well-perfused Respiratory: Normal respiratory effort.  Faint wheezes and crackles over right lung, O2 sat 97 on RA Gastrointestinal: Soft and mildly distended with diffuse tenderness and voluntary guarding Musculoskeletal: Normal range of motion in all extremities. No erythema, warmth or pitting edema of bilateral lower extremities Neurologic: Normal speech and language.  No facial droop.  Moving all extremities equally.  Sensation grossly intact.  No gross focal neurologic deficits are appreciated. Skin: Skin is warm, dry and intact. No rash noted. Psychiatric: Mood and affect are normal. Speech and behavior are normal.  ED Results / Procedures / Treatments   Labs (all labs ordered are listed, but only abnormal results are displayed) Labs Reviewed  URINALYSIS, ROUTINE W REFLEX MICROSCOPIC -  Abnormal; Notable for the following components:      Result Value   Specific Gravity, Urine 1.040 (*)    Glucose, UA >=500 (*)    Ketones, ur 5 (*)    Protein, ur 30 (*)    Bacteria, UA RARE (*)    All other components within normal limits  COMPREHENSIVE METABOLIC PANEL - Abnormal; Notable for the following components:   Glucose, Bld 106 (*)    Creatinine, Ser 1.42 (*)    GFR, Estimated 52 (*)    All other components within normal limits  CBC WITH DIFFERENTIAL/PLATELET - Abnormal; Notable for the following components:   RDW 16.1 (*)    Platelets 124 (*)    Lymphs Abs 0.6 (*)    All other components within normal limits  RESP PANEL BY RT-PCR (RSV, FLU A&B, COVID)  RVPGX2  LIPASE, BLOOD  LACTIC ACID, PLASMA  PROTIME-INR  APTT  CK  TROPONIN I (HIGH SENSITIVITY)  TROPONIN I (HIGH SENSITIVITY)    EKG EKG Interpretation  Date/Time:  Thursday June 20 2022 12:01:55 EST Ventricular Rate:  60 PR Interval:    QRS Duration: 170 QT Interval:  474 QTC Calculation: 474 R Axis:   -73 Text Interpretation: Ventricular-paced rhythm Abnormal ECG No significant change since last tracing Confirmed by Georgina Snell [phone]) on 06/20/2022 12:33:08 PM  Radiology CT Angio Chest/Abd/Pel for Dissection W and/or Wo Contrast  Result Date: 06/20/2022 CLINICAL DATA:  Aortic aneurysm, chest pain, abdominal pain EXAM: CT ANGIOGRAPHY CHEST, ABDOMEN AND PELVIS TECHNIQUE: Non-contrast CT of the chest was initially obtained. Multidetector CT imaging through the chest, abdomen and pelvis was performed using the standard protocol during bolus administration of intravenous contrast. Multiplanar reconstructed images and MIPs were obtained and reviewed to evaluate the vascular anatomy. RADIATION DOSE REDUCTION: This exam was performed according to the departmental dose-optimization program which includes automated exposure control, adjustment of the mA and/or kV according to patient size and/or use of  iterative reconstruction technique. CONTRAST:  166m OMNIPAQUE IOHEXOL 350 MG/ML SOLN COMPARISON:  01/26/2022 FINDINGS: CTA CHEST FINDINGS Cardiovascular: The thoracic aorta is normal in course. Mild dilation of the ascending aorta measuring 4.1 cm in maximal diameter is stable. No intramural hematoma or dissection. Descending thoracic aorta is of normal caliber. Moderate superimposed atherosclerotic calcification noted. Arch vasculature  demonstrates normal anatomic configuration and is widely patent proximally. Extensive multi-vessel coronary artery calcification. Aortic valve replacement has been performed. Right atrial enlargement again noted though global cardiac size is within normal limits. No pericardial effusion. Left subclavian dual lead pacemaker in place with pacemaker leads within the right atrium and right ventricle, unchanged. Central pulmonary arteries are mildly enlarged in keeping with changes of pulmonary arterial hypertension. Mediastinum/Nodes: The thyroid gland is absent or atrophic. Stable shotty right paratracheal, right hilar, aortopulmonary and subcarinal adenopathy since remote prior examination of 10/16/2019, possibly the residua of prior inflammation. No frankly pathologic adenopathy within the thorax. Esophagus unremarkable. Lungs/Pleura: Mild emphysema. There is diffuse bronchial wall thickening again identified in keeping with airway inflammation stable subtle ground-glass pulmonary infiltrate within the a right upper lobe anteriorly is better visualized on prior examination is compatible with probable post inflammatory scarring. No confluent pulmonary infiltrate. Stable subpleural 7 mm pulmonary nodule within the right upper lobe along the major fissure at axial image # 59/8 since remote prior examination, safely considered benign. No follow-up imaging is recommended for this nodule. No pneumothorax or pleural effusion. Musculoskeletal: No acute bone abnormality. No lytic or blastic  bone lesion. Healed right ninth rib fracture noted. Review of the MIP images confirms the above findings. CTA ABDOMEN AND PELVIS FINDINGS VASCULAR Aorta: No abdominal aortic aneurysm or dissection. Moderate mixed atherosclerotic plaque. No periaortic inflammatory change. Celiac: Patent without evidence of aneurysm, dissection, vasculitis or significant stenosis. SMA: 50% stenosis at the origin. Distally widely patent. No aneurysm or dissection. Renals: Near occlusion of the right renal artery at its origin. Single left renal artery is widely patent. Normal vascular morphology. No aneurysm or dissection. IMA: High-grade (greater than 75%) stenosis of the inferior mesenteric artery at its origin. Distally widely patent. Inflow: Moderate atherosclerotic calcification. No hemodynamically significant stenosis. No aneurysm or dissection. Internal iliac arteries demonstrate high-grade stenoses at their origins bilaterally but are patent. Veins: No obvious venous abnormality within the limitations of this arterial phase study. Review of the MIP images confirms the above findings. NON-VASCULAR Hepatobiliary: Cholelithiasis without pericholecystic inflammatory change. Liver unremarkable. No intra or extrahepatic biliary ductal dilation. Pancreas: Unremarkable Spleen: Stable moderate splenomegaly with the spleen measuring 16 cm in greatest dimension. No focal intrasplenic lesion identified. Adrenals/Urinary Tract: The adrenal glands are unremarkable. The right kidney is markedly atrophic, progressive since prior examination. Post ablated changes are noted within the interpolar region of the left kidney. Since the prior examination, there is increasing soft tissue within the posterior pararenal space superficial to the area of ablation which is not well characterized on this examination. This may represent progressive post inflammatory change or local recurrence. The left kidney is otherwise unremarkable. The bladder is  unremarkable. Stomach/Bowel: Moderate colonic stool burden. Stomach, small bowel, and large bowel are otherwise unremarkable no evidence of obstruction or focal inflammation. No free intraperitoneal gas or fluid. Lymphatic: No pathologic adenopathy within the abdomen and pelvis. Reproductive: Prostate is unremarkable. Other: Small fat containing left inguinal hernia. Broad-based shallow fat containing umbilical hernia contains a single wall of a mid small bowel loop without superimposed inflammatory change. Musculoskeletal: No acute or significant osseous findings. Review of the MIP images confirms the above findings. IMPRESSION: 1. No evidence of thoracoabdominal aortic aneurysm or dissection. 2. Extensive multi-vessel coronary artery calcification. Morphologic changes in keeping with pulmonary arterial hypertension. 3. Mild emphysema. Diffuse bronchial wall thickening in keeping with airway inflammation. 4. Cholelithiasis. 5. Stable moderate splenomegaly. 6. Moderate colonic stool burden. No evidence  of obstruction or focal inflammation. 7. Post ablated changes involving the posterior interpolar region of the left kidney with increasing pararenal soft tissue suspicious for local recurrence. Dedicated renal mass protocol CT examination is recommended for further evaluation. 8. Hemodynamically significant stenoses of the superior mesenteric artery and inferior mesenteric arteries at their origins. 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 Aortic Atherosclerosis (ICD10-I70.0) and Emphysema (ICD10-J43.9). Electronically Signed   By: Fidela Salisbury M.D.   On: 06/20/2022 12:58   DG Chest 2 View  Result Date: 06/20/2022 CLINICAL DATA:  Fevers.  Generalized fatigue. EXAM: CHEST - 2 VIEW COMPARISON:  Chest two views 05/13/2022 FINDINGS: Cardiac silhouette is again mildly enlarged. Status post median sternotomy, aortic valve prosthesis, and atrial  appendage clipping. Left chest wall cardiac pacer leads overlie the right atrium and right ventricle, similar to prior. The lungs are clear.  No pleural effusion or pneumothorax. Mild multilevel degenerative disc changes of the thoracic spine. IMPRESSION: 1. No acute cardiopulmonary process. 2. Mild cardiomegaly.  Postsurgical changes as above. Electronically Signed   By: Yvonne Kendall M.D.   On: 06/20/2022 08:32    Procedures Procedures    Medications Ordered in ED Medications  morphine (PF) 4 MG/ML injection 4 mg (4 mg Intravenous Given 06/20/22 0934)  ondansetron (ZOFRAN) injection 4 mg (4 mg Intravenous Given 06/20/22 0934)  sodium chloride 0.9 % bolus 500 mL (0 mLs Intravenous Stopped 06/20/22 1512)  iohexol (OMNIPAQUE) 350 MG/ML injection 100 mL (100 mLs Intravenous Contrast Given 06/20/22 1149)  morphine (PF) 4 MG/ML injection 4 mg (4 mg Intravenous Given 06/20/22 1356)  acetaminophen (TYLENOL) tablet 1,000 mg (1,000 mg Oral Given 06/20/22 1358)    ED Course/ Medical Decision Making/ A&P Clinical Course as of 06/21/22 1729  Thu Jun 20, 2022  1144 Labs reviewed by me generally unremarkable.  Normal lactate 1.7.  Normal white blood cell count 7.6.  Hemoglobin 14.4, no anemia.  Creatinine 1.42 generally at baseline.  No transaminitis normal lipase.  RVP negative.  CK unremarkable.  Troponin 14.  This is down trended from baseline. [VB]  [id number] S/o pending rpt trop to dr Tamera Punt, if negative, plan to discharge with outpatient follow up, [VB]    Clinical Course User Index [VB] Elgie Congo, MD   {                            Medical Decision Making WINSOR ESS is a 72 y.o. male.  With PMH of CAD, A-fib on Eliquis, pulmonary hypertension, COPD, CHF presenting with generalized pain with nausea but no vomiting.   Patient presents afebrile, mildly hypertensive, not tachycardic or tachypneic or hypoxic.  He is not septic on presentation.  Presentation with diffuse pain as well as  diffusely tender abdomen with no fever and nausea but no vomiting could have multiple underlying etiologies including but not limited to viral syndrome, atypical ACS, intra-abdominal pathology such as cholecystitis, diverticulitis, appendicitis.  Also consider possible dissection with radiation of pain to the legs and neck and known history of ascending aortic aneurysm although less likely with no pulse or neurologic deficits.  EKG reviewed generally unchanged from prior with reassuring initial high sensitive troponin doubt ACS.  UA without evidence of UTI however suggestive of dehydration with protein and ketones given IV fluids.  Creatinine 1.42 generally at baseline.  Lactate 1.7 reassuring.  CTA dissection obtained which I personally reviewed no  evidence of dissection.  No acute findings however findings suggestive of chronic mesenteric ischemia.  Doubt acute mesenteric ischemia as patient tolerating p.o. and lactate reassuring.  Also findings suggestive of recurrence of previous renal tissue mass.  Patient did have improvement with pain and nausea in ED and was tolerating p.o.  Did discuss findings on CT with patient and patient's wife they have plans for follow-up CT outpatient.  Also provided vascular surgery follow-up for mesenteric stenosis.  Signed out to Dr. Tamera Punt pending repeat troponin and plans for likely discharge with outpatient follow-up if negative.  Amount and/or Complexity of Data Reviewed Labs: ordered. Radiology: ordered.  Risk OTC drugs. Prescription drug management.    Final Clinical Impression(s) / ED Diagnoses Final diagnoses:  Pain  Nausea  Mesenteric artery stenosis (HCC)  Dehydration    Rx / DC Orders ED Discharge Orders          Ordered    ondansetron (ZOFRAN-ODT) 4 MG disintegrating tablet  Every 8 hours PRN        06/20/22 1414              Elgie Congo, MD 06/21/22 1729

## 2022-06-20 NOTE — ED Triage Notes (Signed)
Pt arrived POV from home c/o generalized body aches that started earlier this evening. Pt endorses nausea but no vomiting.

## 2022-06-20 NOTE — ED Provider Notes (Signed)
Care was taken over from Dr. Nechama Guard.  Patient presented with various pain complaints of his chest, abdomen and extremities.  He had an extensive workup including a CTA of his chest and abdomen which showed some nonacute findings.  These findings were discussed with the patient and Dr. Nechama Guard his given him information about following up with vascular surgery and he has an appointment to follow-up with his PCP regarding the renal lesion.  Patient was awaiting a second troponin which was negative.  His other blood work is nonconcerning.  Peripheral pulses are intact.  His wife says that he has been seen for similar symptoms in the past and they have not really found an etiology.  He will plan on following up with his PCP.  Return precautions were given.   Victor Johns, MD 06/20/22 [id number]

## 2022-06-20 NOTE — Discharge Instructions (Signed)
  You have been seen in the Emergency Department (ED) for abdominal, chest and leg pain with nausea. Your workup was generally reassuring; however, it did not identify a clear cause of your symptoms.  As we discussed, you need to follow-up with your doctor regarding the changes around your kidney that was previously biopsied.  You need a repeat CT scan of this area.  I have also put in information for the vascular surgeon to follow-up with in the outpatient setting for findings of mesenteric artery stenosis on your Ct scan.  Continue to take your home medications as needed for symptoms today.  I have given you a refill of Zofran to take as needed for nausea or vomiting.  Please follow up with your primary care doctor as soon as possible regarding today's ED visit and the symptoms that are bothering you.  Return to the ED if your pain worsens or fails to improve, you develop bloody vomiting, bloody diarrhea, you are unable to tolerate fluids due to vomiting, fever greater than 101, or any other concerning symptoms.

## 2022-06-20 NOTE — ED Notes (Signed)
This RN assumed care of patient.  Pt arrived to ED reporting generalized pain with intense pain in left leg- bilateral pulses strong. Pt presents with spontaneous breathing, equal bilaterally, and speaks full sentences without difficulty. Pt skin tone is appropriate for ethnicity, dry and warm. Support person at bedside. NAD noted at this time.

## 2022-06-24 ENCOUNTER — Ambulatory Visit (HOSPITAL_COMMUNITY)
Admission: RE | Admit: 2022-06-24 | Discharge: 2022-06-24 | Disposition: A | Discharge status: Home / Self Care | Payer: Medicare Other | Source: Ambulatory Visit | Attending: Interventional Radiology | Admitting: Interventional Radiology

## 2022-06-24 DIAGNOSIS — C642 Malignant neoplasm of left kidney, except renal pelvis: Secondary | ICD-10-CM | POA: Diagnosis present

## 2022-06-27 ENCOUNTER — Other Ambulatory Visit: Payer: Self-pay | Admitting: Emergency Medicine

## 2022-06-28 ENCOUNTER — Ambulatory Visit
Admission: RE | Admit: 2022-06-28 | Discharge: 2022-06-28 | Disposition: A | Discharge status: Home / Self Care | Payer: Medicare Other | Source: Ambulatory Visit | Attending: Interventional Radiology | Admitting: Interventional Radiology

## 2022-06-28 DIAGNOSIS — C642 Malignant neoplasm of left kidney, except renal pelvis: Secondary | ICD-10-CM

## 2022-06-28 NOTE — Progress Notes (Signed)
Chief Complaint: Patient was consulted remotely today (TeleHealth) for follow up after cryoablation of a left renal carcinoma on 02/14/2021.   History of Present Illness: [name] is a 72 y.o. 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  He has been doing fairly well but was in the hospital last fall and recently in the Emergency Department in late February with pain in various places, but no significant imaging findings. A CTA of the chest, abdomen and pelvis was performed on 06/20/22.   Past Medical History:  Diagnosis Date   Aortic stenosis, mild    Arthritis of hand    "just a little bit in both hands" (03/31/2012)   Asthma    "little bit" (03/31/2012)   Atrial fibrillation ([place])    dx '04; DCCV '04, placed on flecainide, failed DCCV 04/2010, flecainide stopped; s/p successful A.fib ablation 01/31/12   CHF (congestive heart failure) ([place])    Controlled type 2 diabetes mellitus without complication, without long-term current use of insulin ([place]) 12/13/2019   COPD (chronic obstructive pulmonary disease) (HCC)    DJD (degenerative joint disease)    Fibromyalgia    GERD (gastroesophageal reflux disease)    Hyperlipidemia    Hypertension    Hypothyroidism    S/P radiation   Left atrial enlargement    LA size 75m by echo 11/21/11   Mitral regurgitation    trivial   Obstructive sleep apnea    mild by sleep study 2013; pt stated he does not have a machine because "it wasn't bad enough for him to have one"   Pacemaker 03/31/2012   Restless leg syndrome    Second degree AV block    Splenomegaly     Past Surgical History:  Procedure Laterality Date   AORTIC VALVE REPLACEMENT N/A 08/22/2015   Procedure: AORTIC VALVE REPLACEMENT (AVR);  Surgeon: PIvin Poot MD;  [facility]  Service: Open Heart Surgery;  Laterality: N/A;   ATRIAL FIBRILLATION ABLATION  01/30/2012   PVI by Dr. ARayann Heman   ATRIAL FIBRILLATION ABLATION N/A 01/31/2012   Procedure: ATRIAL FIBRILLATION ABLATION;  Surgeon: JThompson Grayer MD;  [facility];  Service: Cardiovascular;  Laterality: N/A;   ATRIAL FIBRILLATION ABLATION N/A 04/19/2020   Procedure: ATRIAL FIBRILLATION ABLATION;  Surgeon: AThompson Grayer MD;  [facility];  Service: Cardiovascular;  Laterality: N/A;   BACK SURGERY     X 3   BIOPSY  01/01/2022   Procedure: BIOPSY;  Surgeon: BOtis Brace MD;  [facility];  Service: Gastroenterology;;   CARDIAC CATHETERIZATION N/A 08/09/2015   Procedure: Right/Left Heart Cath and Coronary Angiography;  Surgeon: Peter M JMartinique MD;  [facility];  Service: Cardiovascular;  Laterality: N/A;   CARDIAC CATHETERIZATION N/A 02/05/2016   Procedure: Right/Left Heart Cath and Coronary Angiography;  Surgeon: JJettie Booze MD;  [facility];  Service: Cardiovascular;  Laterality: N/A;   CARDIAC CATHETERIZATION N/A 04/17/2016   Procedure: Right Heart Cath;  Surgeon: DJolaine Artist MD;  [facility];  Service: Cardiovascular;  Laterality: N/A;   CARDIOVASCULAR STRESS TEST  03/12/2002   EF 48%, NO EVIDENCE OF ISCHEMIA   CARDIOVERSION  01/2012; 03/31/2012   CARDIOVERSION N/A 02/25/2012   Procedure: CARDIOVERSION;  Surgeon: JThompson Grayer MD;  [facility];  Service: Cardiovascular;  Laterality: N/A;   CARDIOVERSION N/A 03/31/2012   Procedure:  CARDIOVERSION;  Surgeon: Thompson Grayer, MD;  [facility];  Service: Cardiovascular;  Laterality: N/A;   CARDIOVERSION N/A 11/23/2015   Procedure: CARDIOVERSION;  Surgeon: Larey Dresser, MD;  Location: [place];  Service: Cardiovascular;  Laterality: N/A;   CARDIOVERSION N/A 04/08/2016   Procedure: CARDIOVERSION;  Surgeon: Jolaine Artist, MD;  Location: [place];  Service: Cardiovascular;  Laterality: N/A;   CARDIOVERSION N/A 09/14/2018   Procedure:  CARDIOVERSION;  Surgeon: Josue Hector, MD;  Location: [place];  Service: Cardiovascular;  Laterality: N/A;   CARDIOVERSION N/A 10/19/2019   Procedure: CARDIOVERSION;  Surgeon: Jolaine Artist, MD;  Location: [facility] ENDOSCOPY;  Service: Cardiovascular;  Laterality: N/A;   CARDIOVERSION N/A 02/25/2020   Procedure: CARDIOVERSION;  Surgeon: Fay Records, MD;  Location: [place];  Service: Cardiovascular;  Laterality: N/A;   CARDIOVERSION N/A 05/25/2020   Procedure: CARDIOVERSION;  Surgeon: Lelon Perla, MD;  Location: [facility] ENDOSCOPY;  Service: Cardiovascular;  Laterality: N/A;   CARDIOVERSION N/A 07/10/2020   Procedure: CARDIOVERSION;  Surgeon: Jerline Pain, MD;  Location: [place];  Service: Cardiovascular;  Laterality: N/A;   CLIPPING OF ATRIAL APPENDAGE N/A 08/22/2015   Procedure: CLIPPING OF ATRIAL APPENDAGE;  Surgeon: Ivin Poot, MD;  Location: [place];  Service: Open Heart Surgery;  Laterality: N/A;   COLONOSCOPY WITH PROPOFOL N/A 01/01/2022   Procedure: COLONOSCOPY WITH PROPOFOL;  Surgeon: Otis Brace, MD;  [facility];  Service: Gastroenterology;  Laterality: N/A;   DOPPLER ECHOCARDIOGRAPHY  03/11/2002   EF 70-75%   FINGER SURGERY Left    Middle finger   FINGER TENDON REPAIR  1980's   "right little finger" (03/31/2012)   FLEXIBLE SIGMOIDOSCOPY N/A 12/30/2021   Procedure: FLEXIBLE SIGMOIDOSCOPY;  Surgeon: Wilford Corner, MD;  [facility];  Service: Gastroenterology;  Laterality: N/A;   INSERT / REPLACE / REMOVE PACEMAKER     [employer]   IR RADIOLOGIST EVAL & MGMT  05/06/2019   IR RADIOLOGIST EVAL & MGMT  10/06/2019   IR RADIOLOGIST EVAL & MGMT  10/31/2020   IR RADIOLOGIST EVAL & MGMT  01/18/2021   IR RADIOLOGIST EVAL & MGMT  03/08/2021   IR RADIOLOGIST EVAL & MGMT  06/13/2021   KIDNEY SURGERY Left    LEFT AND RIGHT HEART CATHETERIZATION WITH CORONARY ANGIOGRAM N/A 10/27/2012   Procedure: LEFT AND RIGHT HEART CATHETERIZATION WITH  CORONARY ANGIOGRAM;  Surgeon: Laverda Page, MD;  Location: [facility] CATH LAB;  Service: Cardiovascular;  Laterality: N/A;   LUMBAR DISC SURGERY  1980's X2;  2000's   MAZE N/A 08/22/2015   Procedure: MAZE;  Surgeon: Ivin Poot, MD;  Location: [place];  Service: Open Heart Surgery;  Laterality: N/A;   PACEMAKER INSERTION  03/31/2012   STJ Accent DR pacemaker implanted by Dr Rayann Heman   PERMANENT PACEMAKER INSERTION N/A 03/31/2012   Procedure: PERMANENT PACEMAKER INSERTION;  Surgeon: Thompson Grayer, MD;  Location: [facility] CATH LAB;  Service: Cardiovascular;  Laterality: N/A;   PPM GENERATOR CHANGEOUT N/A 08/08/2020   Procedure: PPM GENERATOR CHANGEOUT;  Surgeon: Thompson Grayer, MD;  Location: [place] CV LAB;  Service: Cardiovascular;  Laterality: N/A;   RADIOLOGY WITH ANESTHESIA Left 02/14/2021   Procedure: RADIOLOGY WITH ANESTHESIA LEFT RENAL CRYOABLATION;  Surgeon: Aletta Edouard, MD;  [facility];  Service: Radiology;  Laterality: Left;   TEE WITHOUT CARDIOVERSION  01/30/2012   Procedure: TRANSESOPHAGEAL ECHOCARDIOGRAM (TEE);  Surgeon: Larey Dresser, MD;  Location: [place];  Service: Cardiovascular;  Laterality: N/A;  ablation next day   TEE WITHOUT CARDIOVERSION N/A 08/22/2015   Procedure: TRANSESOPHAGEAL ECHOCARDIOGRAM (TEE);  Surgeon: Ivin Poot, MD;  Location: [place];  Service: Open Heart Surgery;  Laterality: N/A;   TEE WITHOUT CARDIOVERSION N/A 10/19/2019   Procedure: TRANSESOPHAGEAL ECHOCARDIOGRAM (TEE);  Surgeon: Jolaine Artist, MD;  Location: [facility] ENDOSCOPY;  Service: Cardiovascular;  Laterality: N/A;   US ECHOCARDIOGRAPHY  08/07/2009   EF 55-60%    Allergies: Meloxicam and Vancomycin  Medications: Prior to Admission medications   Medication Sig Start Date End Date Taking? Authorizing Provider  albuterol (PROVENTIL HFA;VENTOLIN HFA) 108 (90 Base) MCG/ACT inhaler Inhale 2 puffs into the lungs every 6 (six) hours as needed for wheezing or shortness of breath. 12/28/15    Collene Gobble, MD  amLODipine (NORVASC) 10 MG tablet Take 5 mg by mouth in the morning.    [provider]  amoxicillin (AMOXIL) 500 MG capsule Take 2 capsules (1,000 mg total) by mouth 2 (two) times daily. Patient not taking: Reported on [id number]   Delora Fuel, MD  apixaban (ELIQUIS) 5 MG TABS tablet Take 1 tablet (5 mg total) by mouth 2 (two) times daily. 05/11/21   Allred, Jeneen Rinks, MD  benzonatate (TESSALON PERLES) 100 MG capsule Take 1 capsule (100 mg total) by mouth every 6 (six) hours as needed for cough. Patient not taking: Reported on 05/23/2022 04/26/22 04/26/23  Eppie Gibson, MD  bisoprolol (ZEBETA) 5 MG tablet Take 1 tablet (5 mg total) by mouth daily. Needs appt 11/19/21   Bensimhon, Shaune Pascal, MD  Budeson-Glycopyrrol-Formoterol (BREZTRI AEROSPHERE) 160-9-4.8 MCG/ACT AERO Inhale 2 puffs into the lungs in the morning and at bedtime. 05/23/22   Collene Gobble, MD  Calcium-Magnesium-Zinc (CAL-MAG-ZINC PO) Take 1 tablet by mouth at bedtime.    [provider]  Cholecalciferol (VITAMIN D3 SUPER STRENGTH) 50 MCG (2000 UT) CAPS Take 8,000 Units by mouth at bedtime.    [provider]  doxycycline (VIBRA-TABS) 100 MG tablet Take 1 tablet (100 mg total) by mouth 2 (two) times daily. 04/03/22   Wallene Huh, DPM  empagliflozin (JARDIANCE) 10 MG TABS tablet Take 1 tablet (10 mg total) by mouth daily. 05/27/22   Bensimhon, Shaune Pascal, MD  fexofenadine (ALLEGRA) 180 MG tablet Take 1 tablet (180 mg total) by mouth daily as needed for allergies or rhinitis. 01/23/20   Aline August, MD  gabapentin (NEURONTIN) 300 MG capsule Take 600-1,200 mg by mouth See admin instructions. Take 600 mg by mouth in the morning, 1,200 mg at bedtime, and an additional 600 mg once a day as needed for neuropathy 09/13/14   [provider]  levothyroxine (SYNTHROID, LEVOTHROID) 175 MCG tablet Take 175 mcg by mouth daily before breakfast.    [provider]  LORazepam  (ATIVAN) 1 MG tablet Take 1 mg by mouth at bedtime. 01/10/22   [provider]  Menthol-Camphor (TIGER BALM ARTHRITIS RUB EX) Apply 1 application topically as needed (for leg pain).    [provider]  montelukast (SINGULAIR) 10 MG tablet TAKE 1 TABLET(10 MG) BY MOUTH AT BEDTIME 06/27/22   Collene Gobble, MD  Multiple Vitamin (MULTIVITAMIN WITH MINERALS) TABS tablet Take 1 tablet by mouth daily. 12/15/19   Sheikh, Omair Latif, DO  ondansetron (ZOFRAN) 4 MG tablet Take 4 mg by mouth daily. 01/03/22   [provider]  ondansetron (ZOFRAN) 4 MG tablet Take 1 tablet (4 mg total) by mouth every 8 (eight) hours as needed for nausea or  vomiting. 04/26/22   Eppie Gibson, MD  ondansetron (ZOFRAN-ODT) 4 MG disintegrating tablet Take 1 tablet (4 mg total) by mouth every 8 (eight) hours as needed for nausea or vomiting. 06/20/22   Elgie Congo, MD  oxyCODONE-acetaminophen (PERCOCET/ROXICET) 5-325 MG tablet Take 1 tablet by mouth 2 (two) times daily as needed for moderate pain or severe pain.    [provider]  oxyCODONE-acetaminophen (PERCOCET/ROXICET) 5-325 MG tablet Take 1 tablet by mouth every 6 (six) hours as needed for severe pain. 05/13/22   Lacretia Leigh, MD  OXYGEN Inhale 2 L/min into the lungs See admin instructions. 2 L/min at bedtime and 2 L/min throughout the day as needed for shortness of breath    [provider]  oxymetazoline (AFRIN) 0.05 % nasal spray Place 1 spray into both nostrils as needed (for nose bleeds).    [provider]  pantoprazole (PROTONIX) 40 MG tablet Take 40 mg by mouth daily before breakfast.    [provider]  polyethylene glycol powder (GLYCOLAX/MIRALAX) 17 GM/SCOOP powder Take 17 g by mouth daily as needed for mild constipation. 01/23/20   Aline August, MD  potassium chloride (KLOR-CON M) 10 MEQ tablet Take 10 mEq by mouth every other day. Take along with Torsemide (30 mg)    [provider]   predniSONE (DELTASONE) 5 MG tablet Take 0.5 tablets (2.5 mg total) by mouth daily with breakfast. 10/22/21   Byrum, Rose Fillers, MD  rOPINIRole (REQUIP) 1 MG tablet Take 1 tablet (1 mg total) by mouth in the morning and at bedtime. 01/23/20   Aline August, MD  rosuvastatin (CRESTOR) 10 MG tablet Take 10 mg by mouth at bedtime.  08/08/16   [provider]  senna (SENOKOT) 8.6 MG TABS tablet Take 1 tablet (8.6 mg total) by mouth 2 (two) times daily. 01/02/22   Hosie Poisson, MD  spironolactone (ALDACTONE) 25 MG tablet Take 1 tablet (25 mg total) by mouth daily. 02/16/16   Shirley Friar, PA-C  SYSTANE ULTRA PF 0.4-0.3 % SOLN Apply 1 drop to eye 3 (three) times daily as needed (for dryness).    [provider]  torsemide (DEMADEX) 20 MG tablet Take 30 mg by mouth every other day. Take along with Potassium (10 meq)    [provider]  traMADol (ULTRAM) 50 MG tablet Take 100 mg by mouth 2 (two) times daily. 10/26/20   [provider]  traZODone (DESYREL) 50 MG tablet Take 50 mg by mouth at bedtime. 08/30/20   [provider]     Family History  Problem Relation Age of Onset   Heart failure Mother    Stroke Father    Neuropathy Neg Hx     Social History   Socioeconomic History   Marital status: Married    Spouse name: Not on file   Number of children: Not on file   Years of education: Not on file   Highest education level: Not on file  Occupational History   Occupation: retired  Tobacco Use   Smoking status: Former    Packs/day: 1.00    Years: 25.00    Total pack years: 25.00    Types: Cigarettes    Start date: 1963    Quit date: 07/18/1991    Years since quitting: 30.9    Passive exposure: Never   Smokeless tobacco: Never  Vaping Use   Vaping Use: Never used  Substance and Sexual Activity   Alcohol use: Not Currently  Comment: occasional   Drug use: Yes    Types: Marijuana    Comment: routine use   Sexual activity: Not  Currently  Other Topics Concern   Not on file  Social History Narrative   Lives in [place] with spouse.  Works as a Air traffic controller.   Social Determinants of Health   Financial Resource Strain: Not on file  Food Insecurity: Not on file  Transportation Needs: Not on file  Physical Activity: Not on file  Stress: Not on file  Social Connections: Not on file    ECOG Status: 0 - Asymptomatic  Review of Systems  Constitutional: Negative.   Respiratory: Negative.    Cardiovascular: Negative.   Gastrointestinal: Negative.   Genitourinary: Negative.   Neurological: Negative.     Review of Systems: A 12 point ROS discussed and pertinent positives are indicated in the HPI above.  All other systems are negative.   Physical Exam No direct physical exam was performed (except for noted visual exam findings with Video Visits).   Vital Signs: There were no vitals taken for this visit.  Imaging: CT ABDOMEN PELVIS WO CONTRAST  Result Date: 06/25/2022 CLINICAL DATA:  History of renal cell carcinoma post ablation of the LEFT kidney. * Tracking Code: BO * EXAM: CT ABDOMEN AND PELVIS WITHOUT CONTRAST TECHNIQUE: Multidetector CT imaging of the abdomen and pelvis was performed following the standard protocol without IV contrast. RADIATION DOSE REDUCTION: This exam was performed according to the departmental dose-optimization program which includes automated exposure control, adjustment of the mA and/or kV according to patient size and/or use of iterative reconstruction technique. COMPARISON:  CT angiography performed on June 20, 2022 of the chest, abdomen and pelvis, also multiple prior studies. FINDINGS: Lower chest: Granulomatous changes at the lung bases. No consolidation or sign of pleural effusion. Median sternotomy changes for valve replacement and dual lead pacer device, incompletely evaluated. Hepatobiliary: Smooth hepatic contours. Mild fissural widening of hepatic fissures. No  pericholecystic stranding. No gross biliary duct distension. No visible lesion on noncontrast imaging. Cholelithiasis. Pancreas: Mild pancreatic atrophy without signs of inflammation. Spleen: Mild splenomegaly near 14-15 cm greatest axial dimension. Adrenals/Urinary Tract: Adrenal glands are normal. Marked RIGHT renal cortical scarring. No signs of hydronephrosis. No nephrolithiasis. No ureteral calculi or perivesical stranding. LEFT kidney: LEFT kidney with parenchymal scarring related to post ablation changes. Stranding about the LEFT kidney related to procedural changes. No overt nodularity or suspicious findings on noncontrast imaging to suggest disease recurrence at this time. Stomach/Bowel: No acute gastrointestinal findings. The appendix is normal. Vascular/Lymphatic: Aortic atherosclerosis. No sign of aneurysm. Smooth contour of the IVC. There is no gastrohepatic or hepatoduodenal ligament lymphadenopathy. No retroperitoneal or mesenteric lymphadenopathy. No pelvic sidewall lymphadenopathy. Atherosclerotic changes with calcification are moderate to marked. Assessment of vascular structures is limited due to lack of intravenous contrast. Reproductive: Unremarkable. Other: Small to moderate fat containing LEFT inguinal and umbilical hernias. Musculoskeletal: No acute bone finding. No destructive bone process. Spinal degenerative changes. IMPRESSION: 1. LEFT kidney with parenchymal scarring related to post ablation changes. No overt nodularity or suspicious findings on noncontrast imaging to suggest disease recurrence at this time. 2. Marked RIGHT renal cortical scarring. 3. Cholelithiasis. 4. Small to moderate fat containing LEFT inguinal and umbilical hernias. 5. Aortic atherosclerosis. Aortic Atherosclerosis (ICD10-I70.0). Electronically Signed   By: Zetta Bills M.D.   On: 06/25/2022 10:22   CT Angio Chest/Abd/Pel for Dissection W and/or Wo Contrast  Result Date: 06/20/2022 CLINICAL DATA:  Aortic  aneurysm,  chest pain, abdominal pain EXAM: CT ANGIOGRAPHY CHEST, ABDOMEN AND PELVIS TECHNIQUE: Non-contrast CT of the chest was initially obtained. Multidetector CT imaging through the chest, abdomen and pelvis was performed using the standard protocol during bolus administration of intravenous contrast. Multiplanar reconstructed images and MIPs were obtained and reviewed to evaluate the vascular anatomy. RADIATION DOSE REDUCTION: This exam was performed according to the departmental dose-optimization program which includes automated exposure control, adjustment of the mA and/or kV according to patient size and/or use of iterative reconstruction technique. CONTRAST:  161m OMNIPAQUE IOHEXOL 350 MG/ML SOLN COMPARISON:  01/26/2022 FINDINGS: CTA CHEST FINDINGS Cardiovascular: The thoracic aorta is normal in course. Mild dilation of the ascending aorta measuring 4.1 cm in maximal diameter is stable. No intramural hematoma or dissection. Descending thoracic aorta is of normal caliber. Moderate superimposed atherosclerotic calcification noted. Arch vasculature demonstrates normal anatomic configuration and is widely patent proximally. Extensive multi-vessel coronary artery calcification. Aortic valve replacement has been performed. Right atrial enlargement again noted though global cardiac size is within normal limits. No pericardial effusion. Left subclavian dual lead pacemaker in place with pacemaker leads within the right atrium and right ventricle, unchanged. Central pulmonary arteries are mildly enlarged in keeping with changes of pulmonary arterial hypertension. Mediastinum/Nodes: The thyroid gland is absent or atrophic. Stable shotty right paratracheal, right hilar, aortopulmonary and subcarinal adenopathy since remote prior examination of 10/16/2019, possibly the residua of prior inflammation. No frankly pathologic adenopathy within the thorax. Esophagus unremarkable. Lungs/Pleura: Mild emphysema. There is diffuse  bronchial wall thickening again identified in keeping with airway inflammation stable subtle ground-glass pulmonary infiltrate within the a right upper lobe anteriorly is better visualized on prior examination is compatible with probable post inflammatory scarring. No confluent pulmonary infiltrate. Stable subpleural 7 mm pulmonary nodule within the right upper lobe along the major fissure at axial image # 59/8 since remote prior examination, safely considered benign. No follow-up imaging is recommended for this nodule. No pneumothorax or pleural effusion. Musculoskeletal: No acute bone abnormality. No lytic or blastic bone lesion. Healed right ninth rib fracture noted. Review of the MIP images confirms the above findings. CTA ABDOMEN AND PELVIS FINDINGS VASCULAR Aorta: No abdominal aortic aneurysm or dissection. Moderate mixed atherosclerotic plaque. No periaortic inflammatory change. Celiac: Patent without evidence of aneurysm, dissection, vasculitis or significant stenosis. SMA: 50% stenosis at the origin. Distally widely patent. No aneurysm or dissection. Renals: Near occlusion of the right renal artery at its origin. Single left renal artery is widely patent. Normal vascular morphology. No aneurysm or dissection. IMA: High-grade (greater than 75%) stenosis of the inferior mesenteric artery at its origin. Distally widely patent. Inflow: Moderate atherosclerotic calcification. No hemodynamically significant stenosis. No aneurysm or dissection. Internal iliac arteries demonstrate high-grade stenoses at their origins bilaterally but are patent. Veins: No obvious venous abnormality within the limitations of this arterial phase study. Review of the MIP images confirms the above findings. NON-VASCULAR Hepatobiliary: Cholelithiasis without pericholecystic inflammatory change. Liver unremarkable. No intra or extrahepatic biliary ductal dilation. Pancreas: Unremarkable Spleen: Stable moderate splenomegaly with the spleen  measuring 16 cm in greatest dimension. No focal intrasplenic lesion identified. Adrenals/Urinary Tract: The adrenal glands are unremarkable. The right kidney is markedly atrophic, progressive since prior examination. Post ablated changes are noted within the interpolar region of the left kidney. Since the prior examination, there is increasing soft tissue within the posterior pararenal space superficial to the area of ablation which is not well characterized on this examination. This may represent progressive post inflammatory change  or local recurrence. The left kidney is otherwise unremarkable. The bladder is unremarkable. Stomach/Bowel: Moderate colonic stool burden. Stomach, small bowel, and large bowel are otherwise unremarkable no evidence of obstruction or focal inflammation. No free intraperitoneal gas or fluid. Lymphatic: No pathologic adenopathy within the abdomen and pelvis. Reproductive: Prostate is unremarkable. Other: Small fat containing left inguinal hernia. Broad-based shallow fat containing umbilical hernia contains a single wall of a mid small bowel loop without superimposed inflammatory change. Musculoskeletal: No acute or significant osseous findings. Review of the MIP images confirms the above findings. IMPRESSION: 1. No evidence of thoracoabdominal aortic aneurysm or dissection. 2. Extensive multi-vessel coronary artery calcification. Morphologic changes in keeping with pulmonary arterial hypertension. 3. Mild emphysema. Diffuse bronchial wall thickening in keeping with airway inflammation. 4. Cholelithiasis. 5. Stable moderate splenomegaly. 6. Moderate colonic stool burden. No evidence of obstruction or focal inflammation. 7. Post ablated changes involving the posterior interpolar region of the left kidney with increasing pararenal soft tissue suspicious for local recurrence. Dedicated renal mass protocol CT examination is recommended for further evaluation. 8. Hemodynamically significant  stenoses of the superior mesenteric artery and inferior mesenteric arteries at their origins. 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 Aortic Atherosclerosis (ICD10-I70.0) and Emphysema (ICD10-J43.9). Electronically Signed   By: Fidela Salisbury M.D.   On: 06/20/2022 12:58   DG Chest 2 View  Result Date: 06/20/2022 CLINICAL DATA:  Fevers.  Generalized fatigue. EXAM: CHEST - 2 VIEW COMPARISON:  Chest two views 05/13/2022 FINDINGS: Cardiac silhouette is again mildly enlarged. Status post median sternotomy, aortic valve prosthesis, and atrial appendage clipping. Left chest wall cardiac pacer leads overlie the right atrium and right ventricle, similar to prior. The lungs are clear.  No pleural effusion or pneumothorax. Mild multilevel degenerative disc changes of the thoracic spine. IMPRESSION: 1. No acute cardiopulmonary process. 2. Mild cardiomegaly.  Postsurgical changes as above. Electronically Signed   By: Yvonne Kendall M.D.   On: 06/20/2022 08:32    Labs:  CBC: Recent Labs    01/28/22 1144 04/26/22 0447 05/13/22 1300 06/20/22 0920  WBC 9.5 6.6 10.7* 7.6  HGB 13.5 15.9 13.8 14.4  HCT 41.4 49.3 43.6 45.7  PLT 175 124* 112* 124*    COAGS: Recent Labs    12/28/21 1154 12/28/21 2051 12/30/21 0540 12/31/21 0432 01/01/22 0502 06/20/22 0920  INR 1.4*  --   --   --   --  1.2  APTT 34   < > 87* 82* 85* 33   < > = values in this interval not displayed.    BMP: Recent Labs    01/28/22 1144 04/26/22 0447 05/13/22 1317 06/20/22 0920  NA 136 141 138 139  K 4.2 3.9 4.7 4.7  CL 102 107 101 102  CO2 '26 23 25 25  '$ GLUCOSE 106* 115* 104* 106*  BUN 20 33* 17 19  CALCIUM 9.4 9.7 9.3 10.0  CREATININE 1.37* 1.46* 1.47* 1.42*  GFRNONAA 55* 51* 50* 52*    LIVER FUNCTION TESTS: Recent Labs    01/25/22 2207 01/28/22 1144 05/13/22 1317 06/20/22 0920  BILITOT 1.1 1.3* 1.7* 1.1  AST '25 18 31 29  '$ ALT '19 16 27 24   '$ ALKPHOS 91 81 78 64  PROT 7.1 7.3 7.0 7.2  ALBUMIN 4.1 3.9 3.8 4.2    Assessment and Plan:  I spoke with [name] and his wife by phone. 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 I recommended getting back to an annual schedule based on his ablation in October of 2022 and repeating a CT scan in October of next year. We will have to check his renal function close to that time given his CKD, and I encouraged him to stay well hydrated, especially close to the time of getting contrast for any scans.    Electronically Signed: Azzie Roup 06/28/2022, 9:34 AM    I spent a total of 10 Minutes in remote  clinical consultation, greater than 50% of which was counseling/coordinating care post ablation of a left renal carcinoma.    Visit type: Audio only (telephone). Audio (no video) only due to patient's lack of internet/smartphone capability. Alternative for in-person consultation at [facility], [address], [place], [place]. This visit type was conducted due to national recommendations for restrictions regarding the COVID-19 Pandemic (e.g. social distancing).  This format is felt to be most appropriate for this patient at this time.  All issues noted in this document were discussed and addressed.

## 2022-07-02 ENCOUNTER — Encounter: Payer: Self-pay | Admitting: Family

## 2022-07-02 ENCOUNTER — Ambulatory Visit: Payer: Medicare Other | Admitting: Family

## 2022-07-02 DIAGNOSIS — L97511 Non-pressure chronic ulcer of other part of right foot limited to breakdown of skin: Secondary | ICD-10-CM | POA: Diagnosis not present

## 2022-07-02 NOTE — Progress Notes (Signed)
Office Visit Note   Patient: [name]           Date of Birth: [birth year]           MRN: [id number] Visit Date: 07/02/2022              Requested by: Velna Hatchet, MD [address] [place],  [place] [address] PCP: Velna Hatchet, MD  Chief Complaint  Patient presents with   Right Foot - Wound Check      HPI: Patient is a 72 year old gentleman seen today in follow-up for a Wagner grade 1 ulcer beneath the fifth metatarsal head of the right foot.  This has been improved being slowly.  He has been doing daily Dial soap cleansing and antibacterial ointment dressings  Neuropathic pain of the right foot especially in the ulcerative area   Radiographs have shown hammertoes and bunionette deformity on the right as well as multiple corns and calluses.  Assessment & Plan: Visit Diagnoses:  No diagnosis found.   Plan: Will continue his modified shoe wear with a cut out beneath the great toe past the first metatarsal head on the right to offload the ulcerated area.    He will continue with his current wound care.  Follow-up if that things fail to improve.  Follow-Up Instructions: Return in about 3 weeks (around 07/23/2022), or if symptoms worsen or fail to improve.   Ortho Exam  Patient is alert, oriented, no adenopathy, well-dressed, normal affect, normal respiratory effort. On examination of the right foot the patient has a plantarflexed first ray on the right.  There is callused ulceration beneath the fifth metatarsal head this was debrided with a 10 blade knife back to viable tissue there is underlying 5 mm in diameter ulcer this is 1 mm deep there is no drainage no erythema no warmth this does not probe to bone or tendon.  Imaging: No results found. No images are attached to the encounter.  Labs: Lab Results  Component Value Date   HGBA1C 6.5 (H) 12/27/2021   HGBA1C 6.6 (H) 06/11/2021   HGBA1C 6.7 (H) 02/02/2021   ESRSEDRATE 60 (H) 09/04/2015   REPTSTATUS  01/31/2022 FINAL 01/25/2022   GRAMSTAIN  09/04/2015    RARE WBC PRESENT, PREDOMINANTLY PMN NO ORGANISMS SEEN    CULT  01/25/2022    NO GROWTH 5 DAYS Performed at [facility], [place] [address] [place], [place] [address]    LABORGA ESCHERICHIA COLI (A) 12/12/2019   LABORGA ENTEROCOCCUS FAECALIS (A) 12/12/2019     Lab Results  Component Value Date   ALBUMIN 4.2 06/20/2022   ALBUMIN 3.8 05/13/2022   ALBUMIN 3.9 01/28/2022    Lab Results  Component Value Date   MG 2.0 06/11/2021   MG 2.3 12/10/2020   MG 2.4 05/29/2020   No results found for: "VD25OH"  No results found for: "PREALBUMIN"    Latest Ref Rng & Units 06/20/2022    9:20 AM 05/13/2022    1:00 PM 04/26/2022    4:47 AM  CBC EXTENDED  WBC 4.0 - 10.5 K/uL 7.6  10.7  6.6   RBC 4.22 - 5.81 MIL/uL 4.80  4.66  5.37   Hemoglobin 13.0 - 17.0 g/dL 14.4  13.8  15.9   HCT 39.0 - 52.0 % 45.7  43.6  49.3   Platelets 150 - 400 K/uL 124  112  124   NEUT# 1.7 - 7.7 K/uL 6.5     Lymph# 0.7 - 4.0 K/uL 0.6  There is no height or weight on file to calculate BMI.  Orders:  No orders of the defined types were placed in this encounter.  No orders of the defined types were placed in this encounter.    Procedures: No procedures performed  Clinical Data: No additional findings.  ROS:  All other systems negative, except as noted in the HPI. Review of Systems  Objective: Vital Signs: There were no vitals taken for this visit.  Specialty Comments:  No specialty comments available.  PMFS History: Patient Active Problem List   Diagnosis Date Noted   CAP (community acquired pneumonia) 12/28/2021   Constipation 12/28/2021   Pain in both lower extremities 07/03/2021   Peripheral neuropathy [id number]   Acute metabolic encephalopathy [id number]   Marijuana dependence ([place]) 06/09/2021   DNR (do not resuscitate) 06/09/2021   Chest pain 06/08/2021   Renal lesion 02/14/2021   Status post cryoablation 02/14/2021    Allergic rhinitis 12/08/2020   Preop pulmonary/respiratory exam 03/27/2020   Acute on chronic diastolic (congestive) heart failure ([place]) 01/22/2020   Abdominal pain 01/22/2020   Hypothyroidism 12/13/2019   DM2 (diabetes mellitus, type 2) ([place]) 12/13/2019   Chronic kidney disease, stage 3b ([place]) 12/13/2019   Acute bacterial bronchitis 12/13/2019   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   Typical atrial flutter (HCC)    Chronic respiratory failure with hypoxia ([place]) 05/23/2016   CAD (coronary artery disease) 05/23/2016   Persistent atrial fibrillation (HCC)    Atypical atrial flutter ([place])    Visit for monitoring Tikosyn therapy 04/16/2016   Chronic diastolic CHF (congestive heart failure) ([place]) 02/03/2016   Cough 12/06/2015   Acute on chronic respiratory failure with hypoxia ([place]) 09/08/2015   Hallucinations 09/04/2015   Lymphadenopathy 09/04/2015   Ascending aortic aneurysm ([place]) 09/04/2015   Hypoxia 09/02/2015   S/P aortic valve replacement with bioprosthetic valve 09/02/2015   Pleural effusion 09/02/2015   S/P AVR 08/22/2015   GERD without esophagitis 09/20/2014   Intrinsic asthma 10/13/2013   Dyspnea 09/15/2013   Pulmonary hypertension ([place]) 09/15/2013   Shortness of breath 05/05/2013   Pacemaker-St.Jude 04/01/2012   Bradycardia 03/31/2012   Second degree AV block 03/31/2012   AV block, 1st degree 02/01/2012   PAF (paroxysmal atrial fibrillation) ([place]) 11/13/2011   Fatigue 11/13/2011   Hypertension 11/13/2011   Aortic stenosis 11/13/2011   Sleep apnea 11/13/2011   Past Medical History:  Diagnosis Date   Aortic stenosis, mild    Arthritis of hand    "just a little bit in both hands" (03/31/2012)   Asthma    "little bit" (03/31/2012)   Atrial fibrillation ([place])    dx '04; DCCV '04, placed on flecainide, failed DCCV 04/2010, flecainide stopped; s/p successful A.fib ablation 01/31/12   CHF (congestive heart failure) ([place])    Controlled type 2 diabetes mellitus without  complication, without long-term current use of insulin ([place]) 12/13/2019   COPD (chronic obstructive pulmonary disease) (HCC)    DJD (degenerative joint disease)    Fibromyalgia    GERD (gastroesophageal reflux disease)    Hyperlipidemia    Hypertension    Hypothyroidism    S/P radiation   Left atrial enlargement    LA size 63m by echo 11/21/11   Mitral regurgitation    trivial   Obstructive sleep apnea    mild by sleep study 2013; pt stated he does not have a machine because "it wasn't bad enough for him to have one"   Pacemaker 03/31/2012   Restless leg syndrome  Second degree AV block    Splenomegaly     Family History  Problem Relation Age of Onset   Heart failure Mother    Stroke Father    Neuropathy Neg Hx     Past Surgical History:  Procedure Laterality Date   AORTIC VALVE REPLACEMENT N/A 08/22/2015   Procedure: AORTIC VALVE REPLACEMENT (AVR);  Surgeon: Ivin Poot, MD;  Location: [place];  Service: Open Heart Surgery;  Laterality: N/A;   ATRIAL FIBRILLATION ABLATION  01/30/2012   PVI by Dr. Rayann Heman   ATRIAL FIBRILLATION ABLATION N/A 01/31/2012   Procedure: ATRIAL FIBRILLATION ABLATION;  Surgeon: Thompson Grayer, MD;  Location[facility] C[facility] LAB;  Service: Cardiovascular;  Laterality: N/A;   ATRIAL FIBRILLATION ABLATION N/A 04/19/2020   Procedure: ATRIAL FIBRILLATION ABLATION;  Surgeon: Thompson Grayer, MD;  Location: [place] CV LAB;  Service: Cardiovascular;  Laterality: N/A;   BACK SURGERY     X 3   BIOPSY  01/01/2022   Procedure: BIOPSY;  Surgeon: Otis Brace, MD;  Location: [facility] ENDOSCOPY;  Service: Gastroenterology;;   CARDIAC CATHETERIZATION N/A 08/09/2015   Procedure: Right/Left Heart Cath and Coronary Angiography;  Surgeon: Peter M Martinique, MD;  Location: [place] CV LAB;  Service: Cardiovascular;  Laterality: N/A;   CARDIAC CATHETERIZATION N/A 02/05/2016   Procedure: Right/Left Heart Cath and Coronary Angiography;  Surgeon: Jettie Booze, MD;  Location:  [place] CV LAB;  Service: Cardiovascular;  Laterality: N/A;   CARDIAC CATHETERIZATION N/A 04/17/2016   Procedure: Right Heart Cath;  Surgeon: Jolaine Artist, MD;  Location: [place] CV LAB;  Service: Cardiovascular;  Laterality: N/A;   CARDIOVASCULAR STRESS TEST  03/12/2002   EF 48%, NO EVIDENCE OF ISCHEMIA   CARDIOVERSION  01/2012; 03/31/2012   CARDIOVERSION N/A 02/25/2012   Procedure: CARDIOVERSION;  Surgeon: Thompson Grayer, MD;  Location: [facility] CATH LAB;  Service: Cardiovascular;  Laterality: N/A;   CARDIOVERSION N/A 03/31/2012   Procedure: CARDIOVERSION;  Surgeon: Thompson Grayer, MD;  Location: [facility] CATH LAB;  Service: Cardiovascular;  Laterality: N/A;   CARDIOVERSION N/A 11/23/2015   Procedure: CARDIOVERSION;  Surgeon: Larey Dresser, MD;  Location: [place];  Service: Cardiovascular;  Laterality: N/A;   CARDIOVERSION N/A 04/08/2016   Procedure: CARDIOVERSION;  Surgeon: Jolaine Artist, MD;  Location: [place];  Service: Cardiovascular;  Laterality: N/A;   CARDIOVERSION N/A 09/14/2018   Procedure: CARDIOVERSION;  Surgeon: Josue Hector, MD;  Location: [place];  Service: Cardiovascular;  Laterality: N/A;   CARDIOVERSION N/A 10/19/2019   Procedure: CARDIOVERSION;  Surgeon: Jolaine Artist, MD;  Location: [facility] ENDOSCOPY;  Service: Cardiovascular;  Laterality: N/A;   CARDIOVERSION N/A 02/25/2020   Procedure: CARDIOVERSION;  Surgeon: Fay Records, MD;  Location: [facility];  Service: Cardiovascular;  Laterality: N/A;   CARDIOVERSION N/A 05/25/2020   Procedure: CARDIOVERSION;  Surgeon: Lelon Perla, MD;  Location: [facility] ENDOSCOPY;  Service: Cardiovascular;  Laterality: N/A;   CARDIOVERSION N/A 07/10/2020   Procedure: CARDIOVERSION;  Surgeon: Jerline Pain, MD;  Location: [place];  Service: Cardiovascular;  Laterality: N/A;   CLIPPING OF ATRIAL APPENDAGE N/A 08/22/2015   Procedure: CLIPPING OF ATRIAL APPENDAGE;  Surgeon: Ivin Poot, MD;  Location: [place];   Service: Open Heart Surgery;  Laterality: N/A;   COLONOSCOPY WITH PROPOFOL N/A 01/01/2022   Procedure: COLONOSCOPY WITH PROPOFOL;  Surgeon: Otis Brace, MD;  Location: [facility];  Service: Gastroenterology;  Laterality: N/A;   DOPPLER ECHOCARDIOGRAPHY  03/11/2002   EF 70-75%   FINGER  SURGERY Left    Middle finger   FINGER TENDON REPAIR  1980's   "right little finger" (03/31/2012)   FLEXIBLE SIGMOIDOSCOPY N/A 12/30/2021   Procedure: FLEXIBLE SIGMOIDOSCOPY;  Surgeon: Wilford Corner, MD;  Location: [facility];  Service: Gastroenterology;  Laterality: N/A;   INSERT / REPLACE / REMOVE PACEMAKER     St Jude   IR RADIOLOGIST EVAL & MGMT  05/06/2019   IR RADIOLOGIST EVAL & MGMT  10/06/2019   IR RADIOLOGIST EVAL & MGMT  10/31/2020   IR RADIOLOGIST EVAL & MGMT  01/18/2021   IR RADIOLOGIST EVAL & MGMT  03/08/2021   IR RADIOLOGIST EVAL & MGMT  06/13/2021   KIDNEY SURGERY Left    LEFT AND RIGHT HEART CATHETERIZATION WITH CORONARY ANGIOGRAM N/A 10/27/2012   Procedure: LEFT AND RIGHT HEART CATHETERIZATION WITH CORONARY ANGIOGRAM;  Surgeon: Laverda Page, MD;  Location[facility] CATH LAB;  Service: Cardiovascular;  Laterality: N/A;   LUMBAR DISC SURGERY  1980's X2;  2000's   MAZE N/A 08/22/2015   Procedure: MAZE;  Surgeon: Ivin Poot, MD;  Location: [place];  Service: Open Heart Surgery;  Laterality: N/A;   PACEMAKER INSERTION  03/31/2012   STJ Accent DR pacemaker implanted by Dr Rayann Heman   PERMANENT PACEMAKER INSERTION N/A 03/31/2012   Procedure: PERMANENT PACEMAKER INSERTION;  Surgeon: Thompson Grayer, MD;  Location: [facility] CATH LAB;  Service: Cardiovascular;  Laterality: N/A;   PPM GENERATOR CHANGEOUT N/A 08/08/2020   Procedure: PPM GENERATOR CHANGEOUT;  Surgeon: Thompson Grayer, MD;  Location: [place] CV LAB;  Service: Cardiovascular;  Laterality: N/A;   RADIOLOGY WITH ANESTHESIA Left 02/14/2021   Procedure: RADIOLOGY WITH ANESTHESIA LEFT RENAL CRYOABLATION;  Surgeon: Aletta Edouard, MD;   Location: [facility];  Service: Radiology;  Laterality: Left;   TEE WITHOUT CARDIOVERSION  01/30/2012   Procedure: TRANSESOPHAGEAL ECHOCARDIOGRAM (TEE);  Surgeon: Larey Dresser, MD;  Location: [place];  Service: Cardiovascular;  Laterality: N/A;  ablation next day   TEE WITHOUT CARDIOVERSION N/A 08/22/2015   Procedure: TRANSESOPHAGEAL ECHOCARDIOGRAM (TEE);  Surgeon: Ivin Poot, MD;  Location: [place];  Service: Open Heart Surgery;  Laterality: N/A;   TEE WITHOUT CARDIOVERSION N/A 10/19/2019   Procedure: TRANSESOPHAGEAL ECHOCARDIOGRAM (TEE);  Surgeon: Jolaine Artist, MD;  Location: [facility]OSCOPY;  Service: Cardiovascular;  Laterality: N/A;   US ECHOCARDIOGRAPHY  08/07/2009   EF 55-60%   Social History   Occupational History   Occupation: retired  Tobacco Use   Smoking status: Former    Packs/day: 1.00    Years: 25.00    Total pack years: 25.00    Types: Cigarettes    Start date: 1963    Quit date: 07/18/1991    Years since quitting: 30.9    Passive exposure: Never   Smokeless tobacco: Never  Vaping Use   Vaping Use: Never used  Substance and Sexual Activity   Alcohol use: Not Currently    Comment: occasional   Drug use: Yes    Types: Marijuana    Comment: routine use   Sexual activity: Not Currently

## 2022-08-05 ENCOUNTER — Encounter: Payer: Self-pay | Admitting: Physician Assistant

## 2022-08-05 ENCOUNTER — Other Ambulatory Visit (INDEPENDENT_AMBULATORY_CARE_PROVIDER_SITE_OTHER): Payer: Medicare Other

## 2022-08-05 ENCOUNTER — Ambulatory Visit: Payer: Medicare Other | Admitting: Orthopedic Surgery

## 2022-08-05 DIAGNOSIS — L97511 Non-pressure chronic ulcer of other part of right foot limited to breakdown of skin: Secondary | ICD-10-CM | POA: Diagnosis not present

## 2022-08-05 MED ORDER — DOXYCYCLINE HYCLATE 100 MG PO TABS: 100.0000 mg | ORAL_TABLET | Freq: Two times a day (BID) | ORAL | 0 refills | Status: DC

## 2022-08-08 ENCOUNTER — Ambulatory Visit (INDEPENDENT_AMBULATORY_CARE_PROVIDER_SITE_OTHER): Payer: Medicare Other

## 2022-08-08 DIAGNOSIS — I441 Atrioventricular block, second degree: Secondary | ICD-10-CM

## 2022-08-08 LAB — CUP PACEART REMOTE DEVICE CHECK
Battery Remaining Longevity: 117 mo
Battery Remaining Percentage: 86 %
Battery Voltage: 3.02 V
Brady Statistic RV Percent Paced: 99 %
Date Time Interrogation Session: 20240411053119
Implantable Lead Connection Status: 753985
Implantable Lead Connection Status: 753985
Implantable Lead Implant Date: 20131203
Implantable Lead Implant Date: 20131203
Implantable Lead Location: 753859
Implantable Lead Location: 753860
Implantable Lead Model: 1948
Implantable Pulse Generator Implant Date: 20220412
Lead Channel Impedance Value: 680 Ohm
Lead Channel Pacing Threshold Amplitude: 1.125 V
Lead Channel Pacing Threshold Pulse Width: 0.5 ms
Lead Channel Sensing Intrinsic Amplitude: 9.4 mV
Lead Channel Setting Pacing Amplitude: 1.375
Lead Channel Setting Pacing Pulse Width: 0.5 ms
Lead Channel Setting Sensing Sensitivity: 4 mV
Pulse Gen Model: 2272
Pulse Gen Serial Number: 3919039

## 2022-08-16 ENCOUNTER — Ambulatory Visit: Payer: Medicare Other | Admitting: Student

## 2022-08-16 ENCOUNTER — Encounter: Payer: Self-pay | Admitting: Orthopedic Surgery

## 2022-08-16 NOTE — Progress Notes (Signed)
Office Visit Note   Patient: [name]           Date of Birth: 12/02/1950           MRN: [id number] Visit Date: 08/05/2022              Requested by: Alysia Penna, MD [address] [place],  [place] [address] PCP: Alysia Penna, MD  Chief Complaint  Patient presents with   Right Foot - Pain, Follow-up      HPI: Patient is a 72 year old gentleman who is seen in referral from [facility].  Patient has a Wagner grade 1 ulcer fifth metatarsal head.  Assessment & Plan: Visit Diagnoses:  1. Right foot ulcer, limited to breakdown of skin     Plan: Prescription provided for doxycycline.  Minimize weightbearing with a postoperative shoe and a felt relieving donut.  Follow-Up Instructions: Return in about 2 weeks (around 08/19/2022).   Ortho Exam  Patient is alert, oriented, no adenopathy, well-dressed, normal affect, normal respiratory effort. Examination patient has a palpable dorsalis pedis pulse.  He has a Wagner grade 1 ulcer beneath the fifth metatarsal head.  After informed consent a 10 blade knife was used to debride the skin and soft tissue back to healthy viable tissue.  The ulcer is 3 cm in diameter and 3 mm deep after debridement.  There is no exposed bone or tendon.  Imaging: No results found.   Labs: Lab Results  Component Value Date   HGBA1C 6.5 (H) 12/27/2021   HGBA1C 6.6 (H) 06/11/2021   HGBA1C 6.7 (H) 02/02/2021   ESRSEDRATE 60 (H) 09/04/2015   REPTSTATUS 01/31/2022 FINAL 01/25/2022   GRAMSTAIN  09/04/2015    RARE WBC PRESENT, PREDOMINANTLY PMN NO ORGANISMS SEEN    CULT  01/25/2022    NO GROWTH 5 DAYS Performed at [facility], [address], [place], [place] [address]    LABORGA ESCHERICHIA COLI (A) 12/12/2019   LABORGA ENTEROCOCCUS FAECALIS (A) 12/12/2019     Lab Results  Component Value Date   ALBUMIN 4.2 06/20/2022   ALBUMIN 3.8 05/13/2022   ALBUMIN 3.9 01/28/2022    Lab Results  Component Value Date   MG 2.0 06/11/2021    MG 2.3 12/10/2020   MG 2.4 05/29/2020   No results found for: "VD25OH"  No results found for: "PREALBUMIN"    Latest Ref Rng & Units 06/20/2022    9:20 AM 05/13/2022    1:00 PM 04/26/2022    4:47 AM  CBC EXTENDED  WBC 4.0 - 10.5 K/uL 7.6  10.7  6.6   RBC 4.22 - 5.81 MIL/uL 4.80  4.66  5.37   Hemoglobin 13.0 - 17.0 g/dL 86.5  78.4  69.6   HCT 39.0 - 52.0 % 45.7  43.6  49.3   Platelets 150 - 400 K/uL 124  112  124   NEUT# 1.7 - 7.7 K/uL 6.5     Lymph# 0.7 - 4.0 K/uL 0.6        There is no height or weight on file to calculate BMI.  Orders:  Orders Placed This Encounter  Procedures   XR Foot 2 Views Right   Meds ordered this encounter  Medications   doxycycline (VIBRA-TABS) 100 MG tablet    Sig: Take 1 tablet (100 mg total) by mouth 2 (two) times daily.    Dispense:  30 tablet    Refill:  0     Procedures: No procedures performed  Clinical Data: No  additional findings.  ROS:  All other systems negative, except as noted in the HPI. Review of Systems  Objective: Vital Signs: There were no vitals taken for this visit.  Specialty Comments:  No specialty comments available.  PMFS History: Patient Active Problem List   Diagnosis Date Noted   CAP (community acquired pneumonia) 12/28/2021   Constipation 12/28/2021   Pain in both lower extremities 07/03/2021   Peripheral neuropathy 07/03/2021   Acute metabolic encephalopathy 06/09/2021   Marijuana dependence 06/09/2021   DNR (do not resuscitate) 06/09/2021   Chest pain 06/08/2021   Renal lesion 02/14/2021   Status post cryoablation 02/14/2021   Allergic rhinitis 12/08/2020   Preop pulmonary/respiratory exam 03/27/2020   Acute on chronic diastolic (congestive) heart failure 01/22/2020   Abdominal pain 01/22/2020   Hypothyroidism 12/13/2019   DM2 (diabetes mellitus, type 2) 12/13/2019   Chronic kidney disease, stage 3b (HCC) 12/13/2019   Acute bacterial bronchitis 12/13/2019   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    Typical atrial flutter    Chronic respiratory failure with hypoxia 05/23/2016   CAD (coronary artery disease) 05/23/2016   Persistent atrial fibrillation    Atypical atrial flutter    Visit for monitoring Tikosyn therapy 04/16/2016   Chronic diastolic CHF (congestive heart failure) 02/03/2016   Cough 12/06/2015   Acute on chronic respiratory failure with hypoxia 09/08/2015   Hallucinations 09/04/2015   Lymphadenopathy 09/04/2015   Ascending aortic aneurysm 09/04/2015   Hypoxia 09/02/2015   S/P aortic valve replacement with bioprosthetic valve 09/02/2015   Pleural effusion 09/02/2015   S/P AVR 08/22/2015   GERD without esophagitis 09/20/2014   Intrinsic asthma 10/13/2013   Dyspnea 09/15/2013   Pulmonary hypertension (HCC) 09/15/2013   Shortness of breath 05/05/2013   Pacemaker-St.Jude 04/01/2012   Bradycardia 03/31/2012   Second degree AV block 03/31/2012   AV block, 1st degree 02/01/2012   PAF (paroxysmal atrial fibrillation) 11/13/2011   Fatigue 11/13/2011   Hypertension 11/13/2011   Aortic stenosis 11/13/2011   Sleep apnea 11/13/2011   Past Medical History:  Diagnosis Date   Aortic stenosis, mild    Arthritis of hand    "just a little bit in both hands" (03/31/2012)   Asthma    "little bit" (03/31/2012)   Atrial fibrillation    dx '04; DCCV '04, placed on flecainide, failed DCCV 04/2010, flecainide stopped; s/p successful A.fib ablation 01/31/12   CHF (congestive heart failure)    Controlled type 2 diabetes mellitus without complication, without long-term current use of insulin 12/13/2019   COPD (chronic obstructive pulmonary disease)    DJD (degenerative joint disease)    Fibromyalgia    GERD (gastroesophageal reflux disease)    Hyperlipidemia    Hypertension    Hypothyroidism    S/P radiation   Left atrial enlargement    LA size 45mm by echo 11/21/11   Mitral regurgitation    trivial   Obstructive sleep apnea    mild by sleep study 2013; pt stated he does not  have a machine because "it wasn't bad enough for him to have one"   Pacemaker 03/31/2012   Restless leg syndrome    Second degree AV block    Splenomegaly     Family History  Problem Relation Age of Onset   Heart failure Mother    Stroke Father    Neuropathy Neg Hx     Past Surgical History:  Procedure Laterality Date   AORTIC VALVE REPLACEMENT N/A 08/22/2015   Procedure: AORTIC VALVE REPLACEMENT (AVR);  Surgeon: Kerin Perna, MD;  Location: [facility] OR;  Service: Open Heart Surgery;  Laterality: N/A;   ATRIAL FIBRILLATION ABLATION  01/30/2012   PVI by Dr. Johney Frame   ATRIAL FIBRILLATION ABLATION N/A 01/31/2012   Procedure: ATRIAL FIBRILLATION ABLATION;  Surgeon: [name], MD;  Location: [facility] CATH LAB;  Service: Cardiovascular;  Laterality: N/A;   ATRIAL FIBRILLATION ABLATION N/A 04/19/2020   Procedure: ATRIAL FIBRILLATION ABLATION;  Surgeon: [name], MD;  Location: [facility] INVASIVE CV LAB;  Service: Cardiovascular;  Laterality: N/A;   BACK SURGERY     X 3   BIOPSY  01/01/2022   Procedure: BIOPSY;  Surgeon: Kathi Der, MD;  Location: [facility] ENDOSCOPY;  Service: Gastroenterology;;   CARDIAC CATHETERIZATION N/A 08/09/2015   Procedure: Right/Left Heart Cath and Coronary Angiography;  Surgeon: Peter M Swaziland, MD;  Location: [facility] INVASIVE CV LAB;  Service: Cardiovascular;  Laterality: N/A;   CARDIAC CATHETERIZATION N/A 02/05/2016   Procedure: Right/Left Heart Cath and Coronary Angiography;  Surgeon: Corky Crafts, MD;  Location: [facility] INVASIVE CV LAB;  Service: Cardiovascular;  Laterality: N/A;   CARDIAC CATHETERIZATION N/A 04/17/2016   Procedure: Right Heart Cath;  Surgeon: Dolores Patty, MD;  Location: [facility] INVASIVE CV LAB;  Service: Cardiovascular;  Laterality: N/A;   CARDIOVASCULAR STRESS TEST  03/12/2002   EF 48%, NO EVIDENCE OF ISCHEMIA   CARDIOVERSION  01/2012; 03/31/2012   CARDIOVERSION N/A 02/25/2012   Procedure: CARDIOVERSION;  Surgeon: [name], MD;  Location: [facility] CATH  LAB;  Service: Cardiovascular;  Laterality: N/A;   CARDIOVERSION N/A 03/31/2012   Procedure: CARDIOVERSION;  Surgeon: [name], MD;  Location: [facility] CATH LAB;  Service: Cardiovascular;  Laterality: N/A;   CARDIOVERSION N/A 11/23/2015   Procedure: CARDIOVERSION;  Surgeon: Laurey Morale, MD;  Location: [facility] ENDOSCOPY;  Service: Cardiovascular;  Laterality: N/A;   CARDIOVERSION N/A 04/08/2016   Procedure: CARDIOVERSION;  Surgeon: Dolores Patty, MD;  Location: [facility] ENDOSCOPY;  Service: Cardiovascular;  Laterality: N/A;   CARDIOVERSION N/A 09/14/2018   Procedure: CARDIOVERSION;  Surgeon: Wendall Stade, MD;  Location: [facility] ENDOSCOPY;  Service: Cardiovascular;  Laterality: N/A;   CARDIOVERSION N/A 10/19/2019   Procedure: CARDIOVERSION;  Surgeon: Dolores Patty, MD;  Location: [facility] ENDOSCOPY;  Service: Cardiovascular;  Laterality: N/A;   CARDIOVERSION N/A 02/25/2020   Procedure: CARDIOVERSION;  Surgeon: Pricilla Riffle, MD;  Location: [facility] ENDOSCOPY;  Service: Cardiovascular;  Laterality: N/A;   CARDIOVERSION N/A 05/25/2020   Procedure: CARDIOVERSION;  Surgeon: Lewayne Bunting, MD;  Location: [facility] ENDOSCOPY;  Service: Cardiovascular;  Laterality: N/A;   CARDIOVERSION N/A 07/10/2020   Procedure: CARDIOVERSION;  Surgeon: Jake Bathe, MD;  Location: [facility] ENDOSCOPY;  Service: Cardiovascular;  Laterality: N/A;   CLIPPING OF ATRIAL APPENDAGE N/A 08/22/2015   Procedure: CLIPPING OF ATRIAL APPENDAGE;  Surgeon: Kerin Perna, MD;  Location: [facility] OR;  Service: Open Heart Surgery;  Laterality: N/A;   COLONOSCOPY WITH PROPOFOL N/A 01/01/2022   Procedure: COLONOSCOPY WITH PROPOFOL;  Surgeon: Kathi Der, MD;  Location: [facility] ENDOSCOPY;  Service: Gastroenterology;  Laterality: N/A;   DOPPLER ECHOCARDIOGRAPHY  03/11/2002   EF 70-75%   FINGER SURGERY Left    Middle finger   FINGER TENDON REPAIR  1980's   "right little finger" (03/31/2012)   FLEXIBLE SIGMOIDOSCOPY N/A 12/30/2021   Procedure: FLEXIBLE  SIGMOIDOSCOPY;  Surgeon: Charlott Rakes, MD;  Location: [facility] ENDOSCOPY;  Service: Gastroenterology;  Laterality: N/A;   INSERT / REPLACE / REMOVE PACEMAKER     St Jude  IR RADIOLOGIST EVAL & MGMT  05/06/2019   IR RADIOLOGIST EVAL & MGMT  10/06/2019   IR RADIOLOGIST EVAL & MGMT  10/31/2020   IR RADIOLOGIST EVAL & MGMT  01/18/2021   IR RADIOLOGIST EVAL & MGMT  03/08/2021   IR RADIOLOGIST EVAL & MGMT  06/13/2021   KIDNEY SURGERY Left    LEFT AND RIGHT HEART CATHETERIZATION WITH CORONARY ANGIOGRAM N/A 10/27/2012   Procedure: LEFT AND RIGHT HEART CATHETERIZATION WITH CORONARY ANGIOGRAM;  Surgeon: Pamella Pert, MD;  Location: [facility] CATH LAB;  Service: Cardiovascular;  Laterality: N/A;   LUMBAR DISC SURGERY  1980's X2;  2000's   MAZE N/A 08/22/2015   Procedure: MAZE;  Surgeon: Kerin Perna, MD;  Location: [facility] OR;  Service: Open Heart Surgery;  Laterality: N/A;   PACEMAKER INSERTION  03/31/2012   STJ Accent DR pacemaker implanted by Dr Johney Frame   PERMANENT PACEMAKER INSERTION N/A 03/31/2012   Procedure: PERMANENT PACEMAKER INSERTION;  Surgeon: [name], MD;  Location: [facility] CATH LAB;  Service: Cardiovascular;  Laterality: N/A;   PPM GENERATOR CHANGEOUT N/A 08/08/2020   Procedure: PPM GENERATOR CHANGEOUT;  Surgeon: [name], MD;  Location: [facility] INVASIVE CV LAB;  Service: Cardiovascular;  Laterality: N/A;   RADIOLOGY WITH ANESTHESIA Left 02/14/2021   Procedure: RADIOLOGY WITH ANESTHESIA LEFT RENAL CRYOABLATION;  Surgeon: Irish Lack, MD;  Location: [facility] ORS;  Service: Radiology;  Laterality: Left;   TEE WITHOUT CARDIOVERSION  01/30/2012   Procedure: TRANSESOPHAGEAL ECHOCARDIOGRAM (TEE);  Surgeon: Laurey Morale, MD;  Location: [facility] ENDOSCOPY;  Service: Cardiovascular;  Laterality: N/A;  ablation next day   TEE WITHOUT CARDIOVERSION N/A 08/22/2015   Procedure: TRANSESOPHAGEAL ECHOCARDIOGRAM (TEE);  Surgeon: Kerin Perna, MD;  Location: [facility] OR;  Service: Open Heart Surgery;  Laterality: N/A;    TEE WITHOUT CARDIOVERSION N/A 10/19/2019   Procedure: TRANSESOPHAGEAL ECHOCARDIOGRAM (TEE);  Surgeon: Dolores Patty, MD;  Location: [facility] ENDOSCOPY;  Service: Cardiovascular;  Laterality: N/A;   US ECHOCARDIOGRAPHY  08/07/2009   EF 55-60%   Social History   Occupational History   Occupation: retired  Tobacco Use   Smoking status: Former    Packs/day: 1.00    Years: 25.00    Additional pack years: 0.00    Total pack years: 25.00    Types: Cigarettes    Start date: 1963    Quit date: 07/18/1991    Years since quitting: 31.1    Passive exposure: Never   Smokeless tobacco: Never  Vaping Use   Vaping Use: Never used  Substance and Sexual Activity   Alcohol use: Not Currently    Comment: occasional   Drug use: Yes    Types: Marijuana    Comment: routine use   Sexual activity: Not Currently

## 2022-08-20 ENCOUNTER — Ambulatory Visit: Payer: Medicare Other | Admitting: Orthopedic Surgery

## 2022-08-20 DIAGNOSIS — L97511 Non-pressure chronic ulcer of other part of right foot limited to breakdown of skin: Secondary | ICD-10-CM | POA: Diagnosis not present

## 2022-08-21 ENCOUNTER — Ambulatory Visit: Payer: Medicare Other | Admitting: Family

## 2022-08-21 ENCOUNTER — Telehealth: Payer: Self-pay

## 2022-08-21 ENCOUNTER — Telehealth: Payer: Self-pay | Admitting: Student

## 2022-08-21 MED ORDER — APIXABAN 5 MG PO TABS: 5.0000 mg | ORAL_TABLET | Freq: Two times a day (BID) | ORAL | 1 refills | Status: DC

## 2022-08-21 NOTE — Telephone Encounter (Signed)
Pt last saw HF clinic 11/09/21, last labs 06/20/22 Creat 1.42, age 72, weight 79.4kg, based on specified criteria pt is on appropriate dosage of Eliquis  BID for afib.  Will refill rx. Pt has f/u appt with Hassell Done, PA scheduled for 09/10/22.

## 2022-08-21 NOTE — Telephone Encounter (Signed)
*  STAT* If patient is at the pharmacy, call can be transferred to refill team.   1. Which medications need to be refilled? (please list name of each medication and dose if known)   apixaban (ELIQUIS) 5 MG TABS tablet    2. Which pharmacy/location (including street and city if local pharmacy) is medication to be sent to?   [facility] [phone] - [place], [place] - [address]    3. Do they need a 30 day or 90 day supply? 90   Pt wife states pt is completely out

## 2022-08-21 NOTE — Telephone Encounter (Signed)
Pt's wife called and states that she is concerned that the pt's toe looks worse that it did yesterday. She wanted to make an appt to come to the office for eval. The pt is on Doxy bid since 08/05/2022 he is sch for a 5th ray amputation on Friday 08/23/2022. I discussed with Denny Peon and there is not anything else that we could offer in the office but did advise that if the pt starts running a temp, has purulent drainage or increase in pain should proceed to the [facility] ER for eval.will continue with abx and dressing change monitor s/s and call with any concerns.

## 2022-08-22 ENCOUNTER — Ambulatory Visit: Payer: Medicare Other | Attending: Cardiovascular Disease | Admitting: Cardiovascular Disease

## 2022-08-22 ENCOUNTER — Encounter: Payer: Self-pay | Admitting: Cardiovascular Disease

## 2022-08-22 ENCOUNTER — Other Ambulatory Visit: Payer: Self-pay

## 2022-08-22 ENCOUNTER — Encounter (HOSPITAL_COMMUNITY): Payer: Self-pay | Admitting: Orthopedic Surgery

## 2022-08-22 ENCOUNTER — Telehealth: Payer: Self-pay

## 2022-08-22 VITALS — BP 116/70 | HR 62 | Ht 71.0 in | Wt 179.0 lb

## 2022-08-22 DIAGNOSIS — I4821 Permanent atrial fibrillation: Secondary | ICD-10-CM

## 2022-08-22 DIAGNOSIS — Z950 Presence of cardiac pacemaker: Secondary | ICD-10-CM

## 2022-08-22 DIAGNOSIS — I442 Atrioventricular block, complete: Secondary | ICD-10-CM

## 2022-08-22 NOTE — Patient Instructions (Signed)
Medication Instructions:  Your physician recommends that you continue on your current medications as directed. Please refer to the Current Medication list given to you today.  *If you need a refill on your cardiac medications before your next appointment, please call your pharmacy   Follow-Up: At [facility], you and your health needs are our priority.  As part of our continuing mission to provide you with exceptional heart care, we have created designated Provider Care Teams.  These Care Teams include your primary Cardiologist (physician) and Advanced Practice Providers (APPs -  Physician Assistants and Nurse Practitioners) who all work together to provide you with the care you need, when you need it.  Your next appointment:   As scheduled  

## 2022-08-22 NOTE — Progress Notes (Signed)
Electrophysiology Office Note:    Date:  08/22/2022   ID:  MONG NEAL, DOB [birth year], MRN [id number]  PCP:  Alysia Penna, MD   [facility] Providers Cardiologist:  None Electrophysiologist:  [name], MD (Inactive)  Advanced Heart Failure:  Arvilla Meres, MD     Referring MD: Alysia Penna, MD   History of Present Illness:    [name] is a 72 y.o. 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  He underwent AF ablation by Dr. Johney Frame in Dec 2021  He is having an amputation of the 5th ray of the right foot tomorrow.  he has no device related complaints -- no new tenderness, drainage, redness.   Past Medical History:  Diagnosis Date   Aortic stenosis, mild    Arthritis of hand    "just a little bit in both hands" (03/31/2012)   Asthma    "little bit" (03/31/2012)   Atrial fibrillation    dx '04; DCCV '04, placed on flecainide, failed DCCV 04/2010, flecainide stopped; s/p successful A.fib ablation 01/31/12   CHF (congestive heart failure)    Controlled type 2 diabetes mellitus without complication, without long-term current use of insulin 12/13/2019   COPD (chronic obstructive pulmonary disease)    DJD (degenerative joint disease)    Fatty liver    Fibromyalgia    GERD (gastroesophageal reflux disease)    Hyperlipidemia    Hypertension    Hypothyroidism    S/P radiation   Left atrial enlargement    LA size 45mm by echo 11/21/11   Mitral regurgitation    trivial   Obstructive sleep apnea    mild by sleep study 2013; pt stated he does not have a machine because "it wasn't bad enough for him to have one"   Pacemaker 03/31/2012   Pneumonia    last time 2023   Restless leg syndrome    Second degree AV block    Splenomegaly     Past Surgical History:  Procedure Laterality Date   AORTIC VALVE  REPLACEMENT N/A 08/22/2015   Procedure: AORTIC VALVE REPLACEMENT (AVR);  Surgeon: Kerin Perna, MD;  Location: [facility] OR;  Service: Open Heart Surgery;  Laterality: N/A;   ATRIAL FIBRILLATION ABLATION  01/30/2012   PVI by Dr. Johney Frame   ATRIAL FIBRILLATION ABLATION N/A 01/31/2012   Procedure: ATRIAL FIBRILLATION ABLATION;  Surgeon: [name], MD;  Location: [facility] CATH LAB;  Service: Cardiovascular;  Laterality: N/A;   ATRIAL FIBRILLATION ABLATION N/A 04/19/2020   Procedure: ATRIAL FIBRILLATION ABLATION;  Surgeon: [name], MD;  Location: [facility] INVASIVE CV LAB;  Service: Cardiovascular;  Laterality: N/A;   BACK SURGERY     X 3   BIOPSY  01/01/2022   Procedure: BIOPSY;  Surgeon: Kathi Der, MD;  Location: [facility] ENDOSCOPY;  Service: Gastroenterology;;   CARDIAC CATHETERIZATION N/A 08/09/2015   Procedure: Right/Left Heart Cath and Coronary Angiography;  Surgeon: Peter M Swaziland, MD;  Location: [facility] INVASIVE CV LAB;  Service: Cardiovascular;  Laterality: N/A;   CARDIAC CATHETERIZATION N/A 02/05/2016   Procedure: Right/Left Heart Cath and Coronary Angiography;  Surgeon: Corky Crafts, MD;  Location: [facility] INVASIVE CV LAB;  Service: Cardiovascular;  Laterality: N/A;   CARDIAC CATHETERIZATION N/A 04/17/2016   Procedure: Right Heart Cath;  Surgeon: Dolores Patty, MD;  Location: [facility] INVASIVE CV LAB;  Service: Cardiovascular;  Laterality: N/A;  CARDIOVASCULAR STRESS TEST  03/12/2002   EF 48%, NO EVIDENCE OF ISCHEMIA   CARDIOVERSION  01/2012; 03/31/2012   CARDIOVERSION N/A 02/25/2012   Procedure: CARDIOVERSION;  Surgeon: [name], MD;  Location: [facility] CATH LAB;  Service: Cardiovascular;  Laterality: N/A;   CARDIOVERSION N/A 03/31/2012   Procedure: CARDIOVERSION;  Surgeon: [name], MD;  Location: [facility] CATH LAB;  Service: Cardiovascular;  Laterality: N/A;   CARDIOVERSION N/A 11/23/2015   Procedure: CARDIOVERSION;  Surgeon: Laurey Morale, MD;  Location: [facility] ENDOSCOPY;  Service: Cardiovascular;   Laterality: N/A;   CARDIOVERSION N/A 04/08/2016   Procedure: CARDIOVERSION;  Surgeon: Dolores Patty, MD;  Location: [facility] ENDOSCOPY;  Service: Cardiovascular;  Laterality: N/A;   CARDIOVERSION N/A 09/14/2018   Procedure: CARDIOVERSION;  Surgeon: Wendall Stade, MD;  Location: [facility] ENDOSCOPY;  Service: Cardiovascular;  Laterality: N/A;   CARDIOVERSION N/A 10/19/2019   Procedure: CARDIOVERSION;  Surgeon: Dolores Patty, MD;  Location: [facility] ENDOSCOPY;  Service: Cardiovascular;  Laterality: N/A;   CARDIOVERSION N/A 02/25/2020   Procedure: CARDIOVERSION;  Surgeon: Pricilla Riffle, MD;  Location: [facility] ENDOSCOPY;  Service: Cardiovascular;  Laterality: N/A;   CARDIOVERSION N/A 05/25/2020   Procedure: CARDIOVERSION;  Surgeon: Lewayne Bunting, MD;  Location: [facility] ENDOSCOPY;  Service: Cardiovascular;  Laterality: N/A;   CARDIOVERSION N/A 07/10/2020   Procedure: CARDIOVERSION;  Surgeon: Jake Bathe, MD;  Location: [facility] ENDOSCOPY;  Service: Cardiovascular;  Laterality: N/A;   CLIPPING OF ATRIAL APPENDAGE N/A 08/22/2015   Procedure: CLIPPING OF ATRIAL APPENDAGE;  Surgeon: Kerin Perna, MD;  Location: [facility] OR;  Service: Open Heart Surgery;  Laterality: N/A;   COLONOSCOPY WITH PROPOFOL N/A 01/01/2022   Procedure: COLONOSCOPY WITH PROPOFOL;  Surgeon: Kathi Der, MD;  Location: [facility] ENDOSCOPY;  Service: Gastroenterology;  Laterality: N/A;   DOPPLER ECHOCARDIOGRAPHY  03/11/2002   EF 70-75%   FINGER SURGERY Left    Middle finger   FINGER TENDON REPAIR  1980's   "right little finger" (03/31/2012)   FLEXIBLE SIGMOIDOSCOPY N/A 12/30/2021   Procedure: FLEXIBLE SIGMOIDOSCOPY;  Surgeon: Charlott Rakes, MD;  Location: [facility] ENDOSCOPY;  Service: Gastroenterology;  Laterality: N/A;   INSERT / REPLACE / REMOVE PACEMAKER     St Jude   IR RADIOLOGIST EVAL & MGMT  05/06/2019   IR RADIOLOGIST EVAL & MGMT  10/06/2019   IR RADIOLOGIST EVAL & MGMT  10/31/2020   IR RADIOLOGIST EVAL & MGMT  01/18/2021   IR RADIOLOGIST  EVAL & MGMT  03/08/2021   IR RADIOLOGIST EVAL & MGMT  06/13/2021   KIDNEY SURGERY Left    LEFT AND RIGHT HEART CATHETERIZATION WITH CORONARY ANGIOGRAM N/A 10/27/2012   Procedure: LEFT AND RIGHT HEART CATHETERIZATION WITH CORONARY ANGIOGRAM;  Surgeon: Pamella Pert, MD;  Location: [facility] CATH LAB;  Service: Cardiovascular;  Laterality: N/A;   LUMBAR DISC SURGERY  1980's X2;  2000's   MAZE N/A 08/22/2015   Procedure: MAZE;  Surgeon: Kerin Perna, MD;  Location: [facility] OR;  Service: Open Heart Surgery;  Laterality: N/A;   PACEMAKER INSERTION  03/31/2012   STJ Accent DR pacemaker implanted by Dr Johney Frame   PERMANENT PACEMAKER INSERTION N/A 03/31/2012   Procedure: PERMANENT PACEMAKER INSERTION;  Surgeon: [name], MD;  Location: [facility] CATH LAB;  Service: Cardiovascular;  Laterality: N/A;   PPM GENERATOR CHANGEOUT N/A 08/08/2020   Procedure: PPM GENERATOR CHANGEOUT;  Surgeon: [name], MD;  Location: [facility] INVASIVE CV LAB;  Service: Cardiovascular;  Laterality: N/A;   RADIOLOGY WITH ANESTHESIA Left 02/14/2021  Procedure: RADIOLOGY WITH ANESTHESIA LEFT RENAL CRYOABLATION;  Surgeon: Irish Lack, MD;  Location: [facility] ORS;  Service: Radiology;  Laterality: Left;   TEE WITHOUT CARDIOVERSION  01/30/2012   Procedure: TRANSESOPHAGEAL ECHOCARDIOGRAM (TEE);  Surgeon: Laurey Morale, MD;  Location: [facility] ENDOSCOPY;  Service: Cardiovascular;  Laterality: N/A;  ablation next day   TEE WITHOUT CARDIOVERSION N/A 08/22/2015   Procedure: TRANSESOPHAGEAL ECHOCARDIOGRAM (TEE);  Surgeon: Kerin Perna, MD;  Location: [facility] OR;  Service: Open Heart Surgery;  Laterality: N/A;   TEE WITHOUT CARDIOVERSION N/A 10/19/2019   Procedure: TRANSESOPHAGEAL ECHOCARDIOGRAM (TEE);  Surgeon: Dolores Patty, MD;  Location: [facility] ENDOSCOPY;  Service: Cardiovascular;  Laterality: N/A;   US ECHOCARDIOGRAPHY  08/07/2009   EF 55-60%    Current Medications: Current Meds  Medication Sig   albuterol (PROVENTIL HFA;VENTOLIN HFA) 108 (90  Base) MCG/ACT inhaler Inhale 2 puffs into the lungs every 6 (six) hours as needed for wheezing or shortness of breath.   amLODipine (NORVASC) 10 MG tablet Take 5 mg by mouth in the morning.   apixaban (ELIQUIS) 5 MG TABS tablet Take 1 tablet (5 mg total) by mouth 2 (two) times daily.   bisoprolol (ZEBETA) 5 MG tablet Take 1 tablet (5 mg total) by mouth daily. Needs appt   Budeson-Glycopyrrol-Formoterol (BREZTRI AEROSPHERE) 160-9-4.8 MCG/ACT AERO Inhale 2 puffs into the lungs in the morning and at bedtime.   Calcium-Magnesium-Zinc (CAL-MAG-ZINC PO) Take 1 tablet by mouth at bedtime.   Cholecalciferol (VITAMIN D3 SUPER STRENGTH) 50 MCG (2000 UT) CAPS Take 8,000 Units by mouth at bedtime.   empagliflozin (JARDIANCE) 10 MG TABS tablet Take 1 tablet (10 mg total) by mouth daily.   fexofenadine (ALLEGRA) 180 MG tablet Take 1 tablet (180 mg total) by mouth daily as needed for allergies or rhinitis.   gabapentin (NEURONTIN) 300 MG capsule Take 600-1,200 mg by mouth See admin instructions. Take 600 mg by mouth in the morning, 1,200 mg at bedtime, and an additional 600 mg once a day as needed for neuropathy   levothyroxine (SYNTHROID, LEVOTHROID) 175 MCG tablet Take 175 mcg by mouth daily before breakfast.   LORazepam (ATIVAN) 1 MG tablet Take 1 mg by mouth at bedtime.   Menthol-Camphor (TIGER BALM ARTHRITIS RUB EX) Apply 1 application topically as needed (for leg pain).   montelukast (SINGULAIR) 10 MG tablet TAKE 1 TABLET(10 MG) BY MOUTH AT BEDTIME   Multiple Vitamin (MULTIVITAMIN WITH MINERALS) TABS tablet Take 1 tablet by mouth daily.   ondansetron (ZOFRAN-ODT) 4 MG disintegrating tablet Take 1 tablet (4 mg total) by mouth every 8 (eight) hours as needed for nausea or vomiting.   oxyCODONE-acetaminophen (PERCOCET/ROXICET) 5-325 MG tablet Take 1 tablet by mouth every 6 (six) hours as needed for severe pain.   OXYGEN Inhale 2 L/min into the lungs See admin instructions. 2 L/min at bedtime and 2 L/min  throughout the day as needed for shortness of breath   oxymetazoline (AFRIN) 0.05 % nasal spray Place 1 spray into both nostrils as needed (for nose bleeds).   pantoprazole (PROTONIX) 40 MG tablet Take 40 mg by mouth daily before breakfast.   polyethylene glycol powder (GLYCOLAX/MIRALAX) 17 GM/SCOOP powder Take 17 g by mouth daily as needed for mild constipation.   potassium chloride (KLOR-CON M) 10 MEQ tablet Take 10 mEq by mouth every other day. Take along with Torsemide (30 mg)   predniSONE (DELTASONE) 5 MG tablet Take 0.5 tablets (2.5 mg total) by mouth daily with breakfast.   rOPINIRole (REQUIP) 1  MG tablet Take 1 tablet (1 mg total) by mouth in the morning and at bedtime.   rosuvastatin (CRESTOR) 10 MG tablet Take 10 mg by mouth at bedtime.    senna (SENOKOT) 8.6 MG TABS tablet Take 1 tablet (8.6 mg total) by mouth 2 (two) times daily.   spironolactone (ALDACTONE) 25 MG tablet Take 1 tablet (25 mg total) by mouth daily.   sucralfate (CARAFATE) 1 g tablet Take 1 g by mouth 3 (three) times daily.   SYSTANE ULTRA PF 0.4-0.3 % SOLN Apply 1 drop to eye 3 (three) times daily as needed (for dryness).   torsemide (DEMADEX) 20 MG tablet Take 30 mg by mouth every other day. Take along with Potassium (10 meq)   traZODone (DESYREL) 50 MG tablet Take 50 mg by mouth at bedtime.     Allergies:   Meloxicam and Vancomycin   Social and Family History: Reviewed in Epic  ROS:   Please see the history of present illness.    All other systems reviewed and are negative.  EKGs/Labs/Other Studies Reviewed Today:    Echocardiogram:   Monitors:   Stress testing:   Advanced imaging:   EKG:  Last EKG results: AF, V-paced   Recent Labs: 01/25/2022: B Natriuretic Peptide 245.8 06/20/2022: ALT 24; BUN 19; Creatinine, Ser 1.42; Hemoglobin 14.4; Platelets 124; Potassium 4.7; Sodium 139     Physical Exam:    VS:  BP 116/70   Pulse 62   Ht 5\' 11"  (1.803 m)   Wt 179 lb (81.2 kg)   SpO2 96%    BMI 24.97 kg/m     Wt Readings from Last 3 Encounters:  08/22/22 179 lb (81.2 kg)  06/20/22 175 lb (79.4 kg)  05/23/22 170 lb 9.6 oz (77.4 kg)     GEN: Well nourished, well developed in no acute distress CARDIAC: RRR, no murmurs, rubs, gallops The device site is normal -- no tenderness, edema, drainage, redness, threatened erosion. RESPIRATORY:  Normal work of breathing MUSCULOSKELETAL: no edema    ASSESSMENT & PLAN:    Abbott single chamber pacemaker Normal function See paceArt interrogation today   Complete heart block He is pacemaker dependent  Permanent atrial fibrillation Rate is controlled  Pre-operative device evaluation Pt is device-dependent, but the operative site is well away from the device. Out of extreme caution, a magnet may be placed on the device to ensure there will not be inappropriate inhibition.           Medication Adjustments/Labs and Tests Ordered: Current medicines are reviewed at length with the patient today.  Concerns regarding medicines are outlined above.  No orders of the defined types were placed in this encounter.  No orders of the defined types were placed in this encounter.    Signed, Maurice Small, MD  08/22/2022 3:43 PM    [facility]

## 2022-08-22 NOTE — Progress Notes (Signed)
I spoke with Laurice Record, Wendle Carrio's wife and disignated party release. Mrs. Sorter reports that Mr. Launer has not had any chest pain or  shortness of breathy. Patient denies having any s/s of Covid in her household, also denies any known exposure to Covid and patient has not  had any s/s of upper or lower respiratory in the past 8 weeks.   Mr. Maryruth Hancock has a pacmaker, I have requested peri op orders.  Mr. Maryruth Hancock has an appointment with  Dr. Nelly Laurence for cardiac clearance this after noon.  Mr. Manfred Arch PCP is  Dr. Maryan Puls, patient sees a PA-C there.  Mr. Tinkham has type II diabetes, he does not check CBGs.  I called earlier this am, I left a message on voice mail that patient should hold Jardiance this am. Mr. Miler told me he did hold it.

## 2022-08-22 NOTE — Progress Notes (Signed)
PERIOPERATIVE PRESCRIPTION FOR IMPLANTED CARDIAC DEVICE PROGRAMMING  Patient Information: Name:  [name]  DOB:  27-Aug-1950  MRN:  [id number]  Planned Procedure:  Right 5th ray amputation  Surgeon:  Dr. Aldean Baker  Date of Procedure:  08/23/22  Cautery will be used.  Position during surgery:  supine   Device Information:  Clinic EP Physician:  Dr. York Pellant  Device Type:  Pacemaker Manufacturer and Phone #:  St. Jude/Abbott: [phone] Pacemaker Dependent?:  Yes.   Date of Last Device Check:  08/22/2022 Normal Device Function?:  Yes.    Electrophysiologist's Recommendations:  Have magnet available. Provide continuous ECG monitoring when magnet is used or reprogramming is to be performed.  Procedure should not interfere with device function.  No device programming or magnet placement needed.  Per Device Clinic Standing Orders, Wiliam Ke, RN  3:35 PM 08/22/2022

## 2022-08-22 NOTE — Telephone Encounter (Signed)
Outreach made to Pt.  Spoke with wife per DPR.  Advised Pt needs ASAP appointment for device evaluation to clear for procedure tomorrow.  Pt scheduled with Dr. Nelly Laurence today at 3:30 pm.

## 2022-08-23 ENCOUNTER — Ambulatory Visit (HOSPITAL_BASED_OUTPATIENT_CLINIC_OR_DEPARTMENT_OTHER): Payer: Medicare Other | Admitting: Physician Assistant

## 2022-08-23 ENCOUNTER — Ambulatory Visit (HOSPITAL_COMMUNITY): Payer: Medicare Other | Admitting: Physician Assistant

## 2022-08-23 ENCOUNTER — Other Ambulatory Visit: Payer: Self-pay

## 2022-08-23 ENCOUNTER — Ambulatory Visit (HOSPITAL_COMMUNITY)
Admission: RE | Admit: 2022-08-23 | Discharge: 2022-08-23 | Disposition: A | Discharge status: Home / Self Care | Payer: Medicare Other | Attending: Orthopedic Surgery | Admitting: Orthopedic Surgery

## 2022-08-23 ENCOUNTER — Encounter (HOSPITAL_COMMUNITY)
Admission: RE | Disposition: A | Discharge status: Home / Self Care | Payer: Self-pay | Source: Home / Self Care | Attending: Orthopedic Surgery

## 2022-08-23 ENCOUNTER — Encounter (HOSPITAL_COMMUNITY): Payer: Self-pay | Admitting: Orthopedic Surgery

## 2022-08-23 DIAGNOSIS — I4891 Unspecified atrial fibrillation: Secondary | ICD-10-CM | POA: Insufficient documentation

## 2022-08-23 DIAGNOSIS — I110 Hypertensive heart disease with heart failure: Secondary | ICD-10-CM

## 2022-08-23 DIAGNOSIS — Z950 Presence of cardiac pacemaker: Secondary | ICD-10-CM | POA: Insufficient documentation

## 2022-08-23 DIAGNOSIS — J449 Chronic obstructive pulmonary disease, unspecified: Secondary | ICD-10-CM | POA: Diagnosis not present

## 2022-08-23 DIAGNOSIS — M869 Osteomyelitis, unspecified: Secondary | ICD-10-CM | POA: Diagnosis not present

## 2022-08-23 DIAGNOSIS — E1169 Type 2 diabetes mellitus with other specified complication: Secondary | ICD-10-CM

## 2022-08-23 DIAGNOSIS — I2510 Atherosclerotic heart disease of native coronary artery without angina pectoris: Secondary | ICD-10-CM | POA: Insufficient documentation

## 2022-08-23 DIAGNOSIS — I509 Heart failure, unspecified: Secondary | ICD-10-CM | POA: Diagnosis not present

## 2022-08-23 DIAGNOSIS — G4733 Obstructive sleep apnea (adult) (pediatric): Secondary | ICD-10-CM | POA: Insufficient documentation

## 2022-08-23 DIAGNOSIS — Z87891 Personal history of nicotine dependence: Secondary | ICD-10-CM | POA: Insufficient documentation

## 2022-08-23 HISTORY — DX: Pneumonia, unspecified organism: J18.9

## 2022-08-23 HISTORY — PX: AMPUTATION: SHX166

## 2022-08-23 HISTORY — DX: Fatty (change of) liver, not elsewhere classified: K76.0

## 2022-08-23 LAB — CBC
HCT: 40.3 % (ref 39.0–52.0)
Hemoglobin: 13.1 g/dL (ref 13.0–17.0)
MCH: 31.5 pg (ref 26.0–34.0)
MCHC: 32.5 g/dL (ref 30.0–36.0)
MCV: 96.9 fL (ref 80.0–100.0)
Platelets: 164 10*3/uL (ref 150–400)
RBC: 4.16 MIL/uL — ABNORMAL LOW (ref 4.22–5.81)
RDW: 15.9 % — ABNORMAL HIGH (ref 11.5–15.5)
WBC: 8.5 10*3/uL (ref 4.0–10.5)
nRBC: 0 % (ref 0.0–0.2)

## 2022-08-23 LAB — BASIC METABOLIC PANEL
Anion gap: 12 (ref 5–15)
BUN: 34 mg/dL — ABNORMAL HIGH (ref 8–23)
CO2: 25 mmol/L (ref 22–32)
Calcium: 9.4 mg/dL (ref 8.9–10.3)
Chloride: 97 mmol/L — ABNORMAL LOW (ref 98–111)
Creatinine, Ser: 1.91 mg/dL — ABNORMAL HIGH (ref 0.61–1.24)
GFR, Estimated: 37 mL/min — ABNORMAL LOW (ref >60)
Glucose, Bld: 107 mg/dL — ABNORMAL HIGH (ref 70–99)
Potassium: 4.9 mmol/L (ref 3.5–5.1)
Sodium: 134 mmol/L — ABNORMAL LOW (ref 135–145)

## 2022-08-23 LAB — GLUCOSE, CAPILLARY
Glucose-Capillary: 103 mg/dL — ABNORMAL HIGH (ref 70–99)
Glucose-Capillary: 106 mg/dL — ABNORMAL HIGH (ref 70–99)

## 2022-08-23 SURGERY — AMPUTATION, FOOT, RAY
Anesthesia: Monitor Anesthesia Care | Site: Foot | Laterality: Right

## 2022-08-23 MED ORDER — CHLORHEXIDINE GLUCONATE 0.12 % MT SOLN
OROMUCOSAL | Status: AC
  Administered 2022-08-23: 15 mL via OROMUCOSAL
  Filled 2022-08-23: qty 15

## 2022-08-23 MED ORDER — LIDOCAINE HCL (PF) 1 % IJ SOLN
INTRAMUSCULAR | Status: DC | PRN
  Administered 2022-08-23: 20 mL

## 2022-08-23 MED ORDER — 0.9 % SODIUM CHLORIDE (POUR BTL) OPTIME
TOPICAL | Status: DC | PRN
  Administered 2022-08-23: 1000 mL

## 2022-08-23 MED ORDER — PROPOFOL 10 MG/ML IV BOLUS
INTRAVENOUS | Status: AC
  Filled 2022-08-23: qty 20

## 2022-08-23 MED ORDER — PROPOFOL 10 MG/ML IV BOLUS
INTRAVENOUS | Status: DC | PRN
  Administered 2022-08-23 (×6): 10 mg via INTRAVENOUS
  Administered 2022-08-23: 20 mg via INTRAVENOUS
  Administered 2022-08-23 (×2): 10 mg via INTRAVENOUS

## 2022-08-23 MED ORDER — CEFAZOLIN SODIUM-DEXTROSE 2-4 GM/100ML-% IV SOLN
2.0000 g | INTRAVENOUS | Status: AC
  Administered 2022-08-23: 2 g via INTRAVENOUS

## 2022-08-23 MED ORDER — LACTATED RINGERS IV SOLN: INTRAVENOUS | Status: DC

## 2022-08-23 MED ORDER — ORAL CARE MOUTH RINSE: 15.0000 mL | Freq: Once | OROMUCOSAL | Status: AC

## 2022-08-23 MED ORDER — OXYCODONE-ACETAMINOPHEN 5-325 MG PO TABS: 1.0000 | ORAL_TABLET | ORAL | 0 refills | Status: DC | PRN

## 2022-08-23 MED ORDER — FENTANYL CITRATE (PF) 100 MCG/2ML IJ SOLN: 25.0000 ug | INTRAMUSCULAR | Status: DC | PRN

## 2022-08-23 MED ORDER — MIDAZOLAM HCL 5 MG/5ML IJ SOLN
INTRAMUSCULAR | Status: DC | PRN
  Administered 2022-08-23: 1 mg via INTRAVENOUS

## 2022-08-23 MED ORDER — ONDANSETRON HCL 4 MG/2ML IJ SOLN
INTRAMUSCULAR | Status: AC
  Filled 2022-08-23: qty 2

## 2022-08-23 MED ORDER — MIDAZOLAM HCL 2 MG/2ML IJ SOLN
INTRAMUSCULAR | Status: AC
  Filled 2022-08-23: qty 2

## 2022-08-23 MED ORDER — INSULIN ASPART 100 UNIT/ML IJ SOLN: 0.0000 [IU] | INTRAMUSCULAR | Status: DC | PRN

## 2022-08-23 MED ORDER — LIDOCAINE HCL (PF) 1 % IJ SOLN
INTRAMUSCULAR | Status: AC
  Filled 2022-08-23: qty 30

## 2022-08-23 MED ORDER — PHENYLEPHRINE 80 MCG/ML (10ML) SYRINGE FOR IV PUSH (FOR BLOOD PRESSURE SUPPORT)
PREFILLED_SYRINGE | INTRAVENOUS | Status: AC
  Filled 2022-08-23: qty 10

## 2022-08-23 MED ORDER — ACETAMINOPHEN 10 MG/ML IV SOLN: 1000.0000 mg | Freq: Once | INTRAVENOUS | Status: DC | PRN

## 2022-08-23 MED ORDER — FENTANYL CITRATE (PF) 100 MCG/2ML IJ SOLN
INTRAMUSCULAR | Status: DC | PRN
  Administered 2022-08-23: 50 ug via INTRAVENOUS

## 2022-08-23 MED ORDER — CHLORHEXIDINE GLUCONATE 0.12 % MT SOLN: 15.0000 mL | Freq: Once | OROMUCOSAL | Status: AC

## 2022-08-23 MED ORDER — CEFAZOLIN SODIUM-DEXTROSE 2-4 GM/100ML-% IV SOLN
INTRAVENOUS | Status: AC
  Filled 2022-08-23: qty 100

## 2022-08-23 MED ORDER — KETOROLAC TROMETHAMINE 30 MG/ML IJ SOLN
INTRAMUSCULAR | Status: AC
  Filled 2022-08-23: qty 1

## 2022-08-23 MED ORDER — FENTANYL CITRATE (PF) 250 MCG/5ML IJ SOLN
INTRAMUSCULAR | Status: AC
  Filled 2022-08-23: qty 5

## 2022-08-23 MED ORDER — EPHEDRINE 5 MG/ML INJ
INTRAVENOUS | Status: AC
  Filled 2022-08-23: qty 10

## 2022-08-23 SURGICAL SUPPLY — 35 items
BAG COUNTER SPONGE SURGICOUNT (BAG) ×1 IMPLANT
BAG SPNG CNTER NS LX DISP (BAG)
BLADE SAW SGTL MED 73X18.5 STR (BLADE) IMPLANT
BLADE SURG 21 STRL SS (BLADE) ×1 IMPLANT
BNDG COHESIVE 4X5 TAN STRL (GAUZE/BANDAGES/DRESSINGS) ×1 IMPLANT
BNDG COHESIVE 6X5 TAN NS LF (GAUZE/BANDAGES/DRESSINGS) IMPLANT
BNDG GAUZE DERMACEA FLUFF 4 (GAUZE/BANDAGES/DRESSINGS) ×1 IMPLANT
BNDG GZE DERMACEA 4 6PLY (GAUZE/BANDAGES/DRESSINGS) ×1
COVER SURGICAL LIGHT HANDLE (MISCELLANEOUS) ×2 IMPLANT
DRAPE U-SHAPE 47X51 STRL (DRAPES) ×2 IMPLANT
DRSG ADAPTIC 3X8 NADH LF (GAUZE/BANDAGES/DRESSINGS) ×1 IMPLANT
DURAPREP 26ML APPLICATOR (WOUND CARE) ×1 IMPLANT
ELECT REM PT RETURN 9FT ADLT (ELECTROSURGICAL) ×1
ELECTRODE REM PT RTRN 9FT ADLT (ELECTROSURGICAL) ×1 IMPLANT
GAUZE PAD ABD 8X10 STRL (GAUZE/BANDAGES/DRESSINGS) ×2 IMPLANT
GAUZE SPONGE 4X4 12PLY STRL (GAUZE/BANDAGES/DRESSINGS) ×1 IMPLANT
GLOVE BIOGEL PI IND STRL 9 (GLOVE) ×1 IMPLANT
GLOVE SURG ORTHO 9.0 STRL STRW (GLOVE) ×1 IMPLANT
GOWN STRL REUS W/ TWL XL LVL3 (GOWN DISPOSABLE) ×2 IMPLANT
GOWN STRL REUS W/TWL XL LVL3 (GOWN DISPOSABLE) ×2
GRAFT SKIN WND MICRO 38 (Tissue) IMPLANT
KIT BASIN OR (CUSTOM PROCEDURE TRAY) ×1 IMPLANT
KIT TURNOVER KIT B (KITS) ×1 IMPLANT
NDL HYPO 22X1.5 SAFETY MO (MISCELLANEOUS) IMPLANT
NEEDLE HYPO 22X1.5 SAFETY MO (MISCELLANEOUS) ×1 IMPLANT
NS IRRIG 1000ML POUR BTL (IV SOLUTION) ×1 IMPLANT
PACK ORTHO EXTREMITY (CUSTOM PROCEDURE TRAY) ×1 IMPLANT
PAD ABD 8X10 STRL (GAUZE/BANDAGES/DRESSINGS) IMPLANT
PAD ARMBOARD 7.5X6 YLW CONV (MISCELLANEOUS) ×2 IMPLANT
STOCKINETTE IMPERVIOUS LG (DRAPES) IMPLANT
SUT ETHILON 2 0 PSLX (SUTURE) ×1 IMPLANT
SYR CONTROL 10ML LL (SYRINGE) IMPLANT
TOWEL GREEN STERILE (TOWEL DISPOSABLE) ×1 IMPLANT
TUBE CONNECTING 12X1/4 (SUCTIONS) ×1 IMPLANT
YANKAUER SUCT BULB TIP NO VENT (SUCTIONS) ×1 IMPLANT

## 2022-08-23 NOTE — Transfer of Care (Signed)
Immediate Anesthesia Transfer of Care Note  Patient: [name]  Procedure(s) Performed: RIGHT FOOT FIFTH RAY AMPUTATION (Right: Foot)  Patient Location: PACU  Anesthesia Type:MAC  Level of Consciousness: awake, alert , and oriented  Airway & Oxygen Therapy: Patient Spontanous Breathing  Post-op Assessment: Report given to RN and Post -op Vital signs reviewed and stable  Post vital signs: Reviewed and stable  Last Vitals:  Vitals Value Taken Time  BP 125/72 08/23/22 1354  Temp    Pulse 61 08/23/22 1355  Resp    SpO2 100 % 08/23/22 1355  Vitals shown include unvalidated device data.  Last Pain:  Vitals:   08/23/22 1243  TempSrc:   PainSc: 8          Complications: No notable events documented.

## 2022-08-23 NOTE — H&P (Signed)
[name] is an 72 y.o. male.   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  Patient has undergone conservative treatment with persistent ulceration.  Past Medical History:  Diagnosis Date   Aortic stenosis, mild    Arthritis of hand    "just a little bit in both hands" (03/31/2012)   Asthma    "little bit" (03/31/2012)   Atrial fibrillation (HCC)    dx '04; DCCV '04, placed on flecainide, failed DCCV 04/2010, flecainide stopped; s/p successful A.fib ablation 01/31/12   CHF (congestive heart failure) (HCC)    Controlled type 2 diabetes mellitus without complication, without long-term current use of insulin (HCC) 12/13/2019   COPD (chronic obstructive pulmonary disease) (HCC)    DJD (degenerative joint disease)    Fatty liver    Fibromyalgia    GERD (gastroesophageal reflux disease)    Hyperlipidemia    Hypertension    Hypothyroidism    S/P radiation   Left atrial enlargement    LA size 45mm by echo 11/21/11   Mitral regurgitation    trivial   Obstructive sleep apnea    mild by sleep study 2013; pt stated he does not have a machine because "it wasn't bad enough for him to have one"   Pacemaker 03/31/2012   Pneumonia    last time 2023   Restless leg syndrome    Second degree AV block    Splenomegaly     Past Surgical History:  Procedure Laterality Date   AORTIC VALVE REPLACEMENT N/A 08/22/2015   Procedure: AORTIC VALVE REPLACEMENT (AVR);  Surgeon: Kerin Perna, MD;  Location: [facility] OR;  Service: Open Heart Surgery;  Laterality: N/A;   ATRIAL FIBRILLATION ABLATION  01/30/2012   PVI by Dr. Johney Frame   ATRIAL FIBRILLATION ABLATION N/A 01/31/2012   Procedure: ATRIAL FIBRILLATION ABLATION;  Surgeon: [name], MD;  Location: [facility] CATH LAB;  Service: Cardiovascular;  Laterality: N/A;   ATRIAL FIBRILLATION ABLATION N/A 04/19/2020   Procedure: ATRIAL FIBRILLATION  ABLATION;  Surgeon: [name], MD;  Location: [facility] INVASIVE CV LAB;  Service: Cardiovascular;  Laterality: N/A;   BACK SURGERY     X 3   BIOPSY  01/01/2022   Procedure: BIOPSY;  Surgeon: Kathi Der, MD;  Location: [facility] ENDOSCOPY;  Service: Gastroenterology;;   CARDIAC CATHETERIZATION N/A 08/09/2015   Procedure: Right/Left Heart Cath and Coronary Angiography;  Surgeon: Peter M Swaziland, MD;  Location: [facility] INVASIVE CV LAB;  Service: Cardiovascular;  Laterality: N/A;   CARDIAC CATHETERIZATION N/A 02/05/2016   Procedure: Right/Left Heart Cath and Coronary Angiography;  Surgeon: Corky Crafts, MD;  Location: [facility] INVASIVE CV LAB;  Service: Cardiovascular;  Laterality: N/A;   CARDIAC CATHETERIZATION N/A 04/17/2016   Procedure: Right Heart Cath;  Surgeon: Dolores Patty, MD;  Location: [facility] INVASIVE CV LAB;  Service: Cardiovascular;  Laterality: N/A;   CARDIOVASCULAR STRESS TEST  03/12/2002   EF 48%, NO EVIDENCE OF ISCHEMIA   CARDIOVERSION  01/2012; 03/31/2012   CARDIOVERSION N/A 02/25/2012   Procedure: CARDIOVERSION;  Surgeon: [name], MD;  Location: [facility] CATH LAB;  Service: Cardiovascular;  Laterality: N/A;   CARDIOVERSION N/A 03/31/2012   Procedure: CARDIOVERSION;  Surgeon: [name], MD;  Location: [facility] CATH LAB;  Service: Cardiovascular;  Laterality: N/A;   CARDIOVERSION N/A 11/23/2015   Procedure: CARDIOVERSION;  Surgeon: Laurey Morale, MD;  Location: [facility] ENDOSCOPY;  Service: Cardiovascular;  Laterality: N/A;  CARDIOVERSION N/A 04/08/2016   Procedure: CARDIOVERSION;  Surgeon: Dolores Patty, MD;  Location: [facility] ENDOSCOPY;  Service: Cardiovascular;  Laterality: N/A;   CARDIOVERSION N/A 09/14/2018   Procedure: CARDIOVERSION;  Surgeon: Wendall Stade, MD;  Location: [facility] ENDOSCOPY;  Service: Cardiovascular;  Laterality: N/A;   CARDIOVERSION N/A 10/19/2019   Procedure: CARDIOVERSION;  Surgeon: Dolores Patty, MD;  Location: [facility] ENDOSCOPY;  Service: Cardiovascular;  Laterality:  N/A;   CARDIOVERSION N/A 02/25/2020   Procedure: CARDIOVERSION;  Surgeon: Pricilla Riffle, MD;  Location: [facility] ENDOSCOPY;  Service: Cardiovascular;  Laterality: N/A;   CARDIOVERSION N/A 05/25/2020   Procedure: CARDIOVERSION;  Surgeon: Lewayne Bunting, MD;  Location: [facility] ENDOSCOPY;  Service: Cardiovascular;  Laterality: N/A;   CARDIOVERSION N/A 07/10/2020   Procedure: CARDIOVERSION;  Surgeon: Jake Bathe, MD;  Location: [facility] ENDOSCOPY;  Service: Cardiovascular;  Laterality: N/A;   CLIPPING OF ATRIAL APPENDAGE N/A 08/22/2015   Procedure: CLIPPING OF ATRIAL APPENDAGE;  Surgeon: Kerin Perna, MD;  Location: [facility] OR;  Service: Open Heart Surgery;  Laterality: N/A;   COLONOSCOPY WITH PROPOFOL N/A 01/01/2022   Procedure: COLONOSCOPY WITH PROPOFOL;  Surgeon: Kathi Der, MD;  Location: [facility] ENDOSCOPY;  Service: Gastroenterology;  Laterality: N/A;   DOPPLER ECHOCARDIOGRAPHY  03/11/2002   EF 70-75%   FINGER SURGERY Left    Middle finger   FINGER TENDON REPAIR  1980's   "right little finger" (03/31/2012)   FLEXIBLE SIGMOIDOSCOPY N/A 12/30/2021   Procedure: FLEXIBLE SIGMOIDOSCOPY;  Surgeon: Charlott Rakes, MD;  Location: [facility] ENDOSCOPY;  Service: Gastroenterology;  Laterality: N/A;   INSERT / REPLACE / REMOVE PACEMAKER     St Jude   IR RADIOLOGIST EVAL & MGMT  05/06/2019   IR RADIOLOGIST EVAL & MGMT  10/06/2019   IR RADIOLOGIST EVAL & MGMT  10/31/2020   IR RADIOLOGIST EVAL & MGMT  01/18/2021   IR RADIOLOGIST EVAL & MGMT  03/08/2021   IR RADIOLOGIST EVAL & MGMT  06/13/2021   KIDNEY SURGERY Left    LEFT AND RIGHT HEART CATHETERIZATION WITH CORONARY ANGIOGRAM N/A 10/27/2012   Procedure: LEFT AND RIGHT HEART CATHETERIZATION WITH CORONARY ANGIOGRAM;  Surgeon: Pamella Pert, MD;  Location: [facility] CATH LAB;  Service: Cardiovascular;  Laterality: N/A;   LUMBAR DISC SURGERY  1980's X2;  2000's   MAZE N/A 08/22/2015   Procedure: MAZE;  Surgeon: Kerin Perna, MD;  Location: [facility] OR;  Service: Open Heart  Surgery;  Laterality: N/A;   PACEMAKER INSERTION  03/31/2012   STJ Accent DR pacemaker implanted by Dr Johney Frame   PERMANENT PACEMAKER INSERTION N/A 03/31/2012   Procedure: PERMANENT PACEMAKER INSERTION;  Surgeon: [name], MD;  Location: [facility] CATH LAB;  Service: Cardiovascular;  Laterality: N/A;   PPM GENERATOR CHANGEOUT N/A 08/08/2020   Procedure: PPM GENERATOR CHANGEOUT;  Surgeon: [name], MD;  Location: [facility] INVASIVE CV LAB;  Service: Cardiovascular;  Laterality: N/A;   RADIOLOGY WITH ANESTHESIA Left 02/14/2021   Procedure: RADIOLOGY WITH ANESTHESIA LEFT RENAL CRYOABLATION;  Surgeon: Irish Lack, MD;  Location: [facility] ORS;  Service: Radiology;  Laterality: Left;   TEE WITHOUT CARDIOVERSION  01/30/2012   Procedure: TRANSESOPHAGEAL ECHOCARDIOGRAM (TEE);  Surgeon: Laurey Morale, MD;  Location: [facility] ENDOSCOPY;  Service: Cardiovascular;  Laterality: N/A;  ablation next day   TEE WITHOUT CARDIOVERSION N/A 08/22/2015   Procedure: TRANSESOPHAGEAL ECHOCARDIOGRAM (TEE);  Surgeon: Kerin Perna, MD;  Location: [facility] OR;  Service: Open Heart Surgery;  Laterality: N/A;   TEE WITHOUT CARDIOVERSION N/A 10/19/2019  Procedure: TRANSESOPHAGEAL ECHOCARDIOGRAM (TEE);  Surgeon: Dolores Patty, MD;  Location: [facility] ENDOSCOPY;  Service: Cardiovascular;  Laterality: N/A;   US ECHOCARDIOGRAPHY  08/07/2009   EF 55-60%    Family History  Problem Relation Age of Onset   Heart failure Mother    Stroke Father    Neuropathy Neg Hx    Social History:  reports that he quit smoking about 31 years ago. His smoking use included cigarettes. He started smoking about 61 years ago. He has a 25.00 pack-year smoking history. He has never been exposed to tobacco smoke. He has never used smokeless tobacco. He reports that he does not currently use alcohol. He reports current drug use. Drug: Marijuana.  Allergies:  Allergies  Allergen Reactions   Meloxicam Rash and Other (See Comments)    Red Man Syndrome, also    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 syndrome 09/02/15, resolved with diphenhydramine and slowing of rate    No medications prior to admission.    No results found for this or any previous visit (from the past 48 hour(s)). No results found.  Review of Systems  All other systems reviewed and are negative.   There were no vitals taken for this visit. Physical Exam  Examination patient has a palpable dorsalis pedis pulse.  There is ulceration beneath the fifth metatarsal head.  The ulcer probes to bone.  Radiograph shows destructive bony changes of the fifth metatarsal head. Assessment/Plan Assessment: Osteomyelitis fifth metatarsal head right foot with Wagner grade 3 ulceration.  Plan: Will plan for a right foot fifth ray amputation.  Risk and benefits were discussed including persistent infection nonhealing the wound need for additional surgery.  Patient states he understands wished to proceed at this time.  [name], MD 08/23/2022, 6:39 AM

## 2022-08-23 NOTE — Progress Notes (Signed)
Orthopedic Tech Progress Note Patient Details:  [name] 08-04-1950 [id number]  Ortho Devices Type of Ortho Device: Crutches, Postop shoe/boot Ortho Device/Splint Location: RLE Ortho Device/Splint Interventions: Ordered   Post Interventions Patient Tolerated: Well  Lovett Calender 08/23/2022, 2:43 PM

## 2022-08-23 NOTE — Interval H&P Note (Signed)
History and Physical Interval Note:  08/23/2022 12:49 PM  [name]  has presented today for surgery, with the diagnosis of Osteomyelitis Right 5th Metatarsal.  The various methods of treatment have been discussed with the patient and family. After consideration of risks, benefits and other options for treatment, the patient has consented to  Procedure(s): RIGHT FOOT 5TH RAY AMPUTATION (Right) as a surgical intervention.  The patient's history has been reviewed, patient examined, no change in status, stable for surgery.  I have reviewed the patient's chart and labs.  Questions were answered to the patient's satisfaction.     [name]

## 2022-08-23 NOTE — Op Note (Signed)
08/23/2022  1:51 PM  PATIENT:  [name]    PRE-OPERATIVE DIAGNOSIS:  Osteomyelitis Right 5th Metatarsal  POST-OPERATIVE DIAGNOSIS:  Same  PROCEDURE:  RIGHT FOOT FIFTH RAY AMPUTATION Application Kerecis micro graft 38 cm. Local tissue rearrangement for wound closure 10 x 4 cm.  SURGEON:  [name], MD  PHYSICIAN ASSISTANT:None ANESTHESIA:   General  PREOPERATIVE INDICATIONS:  [name] is a  72 y.o. male with a diagnosis of Osteomyelitis Right 5th Metatarsal who failed conservative measures and elected for surgical management.    The risks benefits and alternatives were discussed with the patient preoperatively including but not limited to the risks of infection, bleeding, nerve injury, cardiopulmonary complications, the need for revision surgery, among others, and the patient was willing to proceed.  OPERATIVE IMPLANTS:   Implant Name Type Inv. Item Serial No. Manufacturer Lot No. LRB No. Used Action  GRAFT SKIN WND MICRO 38 - [id number] Tissue GRAFT SKIN WND MICRO 38  [employer] [id number] Right 1 Implanted    @ENCIMAGES @  OPERATIVE FINDINGS: Patient had good petechial bleeding.  Tissue margins were clear.  OPERATIVE PROCEDURE: Patient was brought the operating room underwent a MAC anesthetic.  The right lower extremity was then prepped using DuraPrep draped into a sterile field a timeout was called.  Patient underwent local anesthesia with 20 cc of 1% lidocaine plain.  After adequate levels anesthesia were obtained a racquet incision was made around the ulcerative tissue and the fifth ray.  This left a wound that was 10 x 4 cm.  The metatarsal was resected through the base and resected in 1 block of tissue.  The tissue margins were clear.  The wound was irrigated with normal saline electrocardio was used hemostasis.  There was insufficient soft tissue to cover the fourth metatarsal head.  The fourth metatarsal head was resected this allowed for soft tissue  rearrangement and wound closure.  The tissue margins were undermined and local tissue rearrangement was used to close the wound 10 x 4 cm with 2-0 nylon.  A sterile dressing was applied patient was taken the PACU in stable condition.   DISCHARGE PLANNING:  Antibiotic duration: Preoperative antibiotics  Weightbearing: Touchdown weightbearing on the right  Pain medication: Prescription for Percocet  Dressing care/ Wound VAC: Dry dressing reinforce as needed  Ambulatory devices: Walker or crutches  Discharge to: Home.  Follow-up: In the office 1 week post operative.

## 2022-08-23 NOTE — Anesthesia Preprocedure Evaluation (Addendum)
Anesthesia Evaluation  Patient identified by MRN, date of birth, ID band Patient awake    Reviewed: Allergy & Precautions, NPO status , Patient's Chart, lab work & pertinent test results  Airway Mallampati: II  TM Distance: >3 FB Neck ROM: Full    Dental no notable dental hx.    Pulmonary asthma , sleep apnea , COPD, former smoker   Pulmonary exam normal        Cardiovascular hypertension, Pt. on medications and Pt. on home beta blockers + CAD and +CHF  + dysrhythmias Atrial Fibrillation + pacemaker  Rhythm:Regular Rate:Normal  ECHO 2023:  1. Left ventricular ejection fraction, by estimation, is 55 to 60%. The  left ventricle has normal function. The left ventricle has no regional  wall motion abnormalities. Left ventricular diastolic parameters are  indeterminate.   2. Right ventricular systolic function is normal. The right ventricular  size is normal. The estimated right ventricular systolic pressure is 32.4  mmHg.   3. The mitral valve is normal in structure. Trivial mitral valve  regurgitation. No evidence of mitral stenosis.   4. The aortic valve has been repaired/replaced. Aortic valve  regurgitation is not visualized. No aortic stenosis is present. There is a  bioprosthetic valve present in the aortic position. Aortic valve area, by  VTI measures 1.63 cm. Aortic valve Vmax  measures 2.28 m/s. Aortic valve mean gradient measures 11.0 mmHg. Aortic  valve peak gradient measures 20.8 mmHg.   5. There is mild dilatation of the ascending aorta, measuring 38 mm.   6. The inferior vena cava is normal in size with greater than 50%  respiratory variability, suggesting right atrial pressure of 3 mmHg.   7. Compared to echo 12/13/2019, the mean AVG has increased from 8 to  . DVi has dcreased from 0.65 to 0.57 and AVA has decreased from  2.04cm2 to 1.63cm2. The AVA calculation is likely underestimated due to  small LVOT  measurement.   8. Left atrial size was mildly dilated.     Neuro/Psych  negative psych ROS   GI/Hepatic Neg liver ROS,GERD  Medicated,,  Endo/Other  diabetesHypothyroidism    Renal/GU   negative genitourinary   Musculoskeletal  (+) Arthritis ,  Fibromyalgia -  Abdominal Normal abdominal exam  (+)   Peds  Hematology negative hematology ROS (+)   Anesthesia Other Findings   Reproductive/Obstetrics                             Anesthesia Physical Anesthesia Plan  ASA: 3  Anesthesia Plan: MAC   Post-op Pain Management:    Induction: Intravenous  PONV Risk Score and Plan: 1 and Ondansetron, Dexamethasone, Treatment may vary due to age or medical condition and Propofol infusion  Airway Management Planned: Simple Face Mask and Nasal Cannula  Additional Equipment: None  Intra-op Plan:   Post-operative Plan:   Informed Consent: I have reviewed the patients History and Physical, chart, labs and discussed the procedure including the risks, benefits and alternatives for the proposed anesthesia with the patient or authorized representative who has indicated his/her understanding and acceptance.     Dental advisory given  Plan Discussed with: CRNA  Anesthesia Plan Comments: (Lab Results      Component                Value               Date  WBC                      8.5                 08/23/2022                HGB                      13.1                08/23/2022                HCT                      40.3                08/23/2022                MCV                      96.9                08/23/2022                PLT                      164                 08/23/2022             Lab Results      Component                Value               Date                      NA                       139                 06/20/2022                K                        4.7                 06/20/2022                CO2                       25                  06/20/2022                GLUCOSE                  106 (H)             06/20/2022                BUN                      19                  06/20/2022  CREATININE               1.42 (H)            06/20/2022                CALCIUM                  10.0                06/20/2022                EGFR                     38 (L)              06/25/2021                GFRNONAA                 52 (L)              06/20/2022           )       Anesthesia Quick Evaluation

## 2022-08-24 ENCOUNTER — Encounter (HOSPITAL_COMMUNITY): Payer: Self-pay | Admitting: Orthopedic Surgery

## 2022-08-24 NOTE — Anesthesia Postprocedure Evaluation (Signed)
Anesthesia Post Note  Patient: [name]  Procedure(s) Performed: RIGHT FOOT FIFTH RAY AMPUTATION (Right: Foot)     Patient location during evaluation: PACU Anesthesia Type: MAC Level of consciousness: awake and alert Pain management: pain level controlled Vital Signs Assessment: post-procedure vital signs reviewed and stable Respiratory status: spontaneous breathing, nonlabored ventilation, respiratory function stable and patient connected to nasal cannula oxygen Cardiovascular status: stable and blood pressure returned to baseline Postop Assessment: no apparent nausea or vomiting Anesthetic complications: no   No notable events documented.  Last Vitals:  Vitals:   08/23/22 1400 08/23/22 1415  BP: 123/75 132/71  Pulse: 60 60  Resp: 16 17  Temp:    SpO2: 99% 100%    Last Pain:  Vitals:   08/23/22 1354  TempSrc:   PainSc: 0-No pain                 Earl Lites P Hye Trawick

## 2022-08-25 ENCOUNTER — Encounter: Payer: Self-pay | Admitting: Orthopedic Surgery

## 2022-08-25 NOTE — Progress Notes (Signed)
Office Visit Note   Patient: [name]           Date of Birth: [birth year]           MRN: [id number] Visit Date: 08/20/2022              Requested by: Alysia Penna, MD [address] [place],  [place] [address] PCP: Alysia Penna, MD  Chief Complaint  Patient presents with   Right Foot - Follow-up      HPI Patient is a 72 year old gentleman who presents in follow-up for [facility] grade 3 ulcer right foot fifth metatarsal head he is currently on doxycycline.  He is in a postoperative shoe.  Patient states that the foot felt warm yesterday.  Assessment & Plan: Visit Diagnoses:  1. Right foot ulcer, limited to breakdown of skin (HCC)     Plan: Recommended proceeding with 1/5 ray amputation.  Will set this up for outpatient surgery.  Follow-Up Instructions: No follow-ups on file.   Ortho Exam  Patient is alert, oriented, no adenopathy, well-dressed, normal affect, normal respiratory effort. Examination patient has a good dorsalis pedis pulse.  He has a Wagner grade 3 ulcer beneath the fifth metatarsal head.  The ulcer probes to bone.  Radiographs do not show any destructive bony changes.  Imaging: No results found.   Labs: Lab Results  Component Value Date   HGBA1C 6.5 (H) 12/27/2021   HGBA1C 6.6 (H) 06/11/2021   HGBA1C 6.7 (H) 02/02/2021   ESRSEDRATE 60 (H) 09/04/2015   REPTSTATUS 01/31/2022 FINAL 01/25/2022   GRAMSTAIN  09/04/2015    RARE WBC PRESENT, PREDOMINANTLY PMN NO ORGANISMS SEEN    CULT  01/25/2022    NO GROWTH 5 DAYS Performed at [facility], [address], [place], [place] [address]    LABORGA ESCHERICHIA COLI (A) 12/12/2019   LABORGA ENTEROCOCCUS FAECALIS (A) 12/12/2019     Lab Results  Component Value Date   ALBUMIN 4.2 06/20/2022   ALBUMIN 3.8 05/13/2022   ALBUMIN 3.9 01/28/2022    Lab Results  Component Value Date   MG 2.0 06/11/2021   MG 2.3 12/10/2020   MG 2.4 05/29/2020   No results found for: "VD25OH"  No  results found for: "PREALBUMIN"    Latest Ref Rng & Units 08/23/2022   12:36 PM 06/20/2022    9:20 AM 05/13/2022    1:00 PM  CBC EXTENDED  WBC 4.0 - 10.5 K/uL 8.5  7.6  10.7   RBC 4.22 - 5.81 MIL/uL 4.16  4.80  4.66   Hemoglobin 13.0 - 17.0 g/dL 60.7  37.1  06.2   HCT 39.0 - 52.0 % 40.3  45.7  43.6   Platelets 150 - 400 K/uL 164  124  112   NEUT# 1.7 - 7.7 K/uL  6.5    Lymph# 0.7 - 4.0 K/uL  0.6       There is no height or weight on file to calculate BMI.  Orders:  No orders of the defined types were placed in this encounter.  No orders of the defined types were placed in this encounter.    Procedures: No procedures performed  Clinical Data: No additional findings.  ROS:  All other systems negative, except as noted in the HPI. Review of Systems  Objective: Vital Signs: There were no vitals taken for this visit.  Specialty Comments:  No specialty comments available.  PMFS History: Patient Active Problem List   Diagnosis Date Noted   Osteomyelitis  of fifth toe of right foot (HCC) 08/23/2022   CAP (community acquired pneumonia) 12/28/2021   Constipation 12/28/2021   Pain in both lower extremities 07/03/2021   Peripheral neuropathy 07/03/2021   Acute metabolic encephalopathy 06/09/2021   Marijuana dependence (HCC) 06/09/2021   DNR (do not resuscitate) 06/09/2021   Chest pain 06/08/2021   Renal lesion 02/14/2021   Status post cryoablation 02/14/2021   Allergic rhinitis 12/08/2020   Preop pulmonary/respiratory exam 03/27/2020   Acute on chronic diastolic (congestive) heart failure (HCC) 01/22/2020   Abdominal pain 01/22/2020   Hypothyroidism 12/13/2019   DM2 (diabetes mellitus, type 2) (HCC) 12/13/2019   Chronic kidney disease, stage 3b (HCC) 12/13/2019   Acute bacterial bronchitis 12/13/2019   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   Typical atrial flutter (HCC)    Chronic respiratory failure with hypoxia (HCC) 05/23/2016   CAD (coronary artery disease) 05/23/2016    Persistent atrial fibrillation (HCC)    Atypical atrial flutter (HCC)    Visit for monitoring Tikosyn therapy 04/16/2016   Chronic diastolic CHF (congestive heart failure) (HCC) 02/03/2016   Cough 12/06/2015   Acute on chronic respiratory failure with hypoxia (HCC) 09/08/2015   Hallucinations 09/04/2015   Lymphadenopathy 09/04/2015   Ascending aortic aneurysm (HCC) 09/04/2015   Hypoxia 09/02/2015   S/P aortic valve replacement with bioprosthetic valve 09/02/2015   Pleural effusion 09/02/2015   S/P AVR 08/22/2015   GERD without esophagitis 09/20/2014   Intrinsic asthma 10/13/2013   Dyspnea 09/15/2013   Pulmonary hypertension (HCC) 09/15/2013   Shortness of breath 05/05/2013   Pacemaker-St.Jude 04/01/2012   Bradycardia 03/31/2012   Second degree AV block 03/31/2012   AV block, 1st degree 02/01/2012   PAF (paroxysmal atrial fibrillation) (HCC) 11/13/2011   Fatigue 11/13/2011   Hypertension 11/13/2011   Aortic stenosis 11/13/2011   Sleep apnea 11/13/2011   Past Medical History:  Diagnosis Date   Aortic stenosis, mild    Arthritis of hand    "just a little bit in both hands" (03/31/2012)   Asthma    "little bit" (03/31/2012)   Atrial fibrillation (HCC)    dx '04; DCCV '04, placed on flecainide, failed DCCV 04/2010, flecainide stopped; s/p successful A.fib ablation 01/31/12   CHF (congestive heart failure) (HCC)    Controlled type 2 diabetes mellitus without complication, without long-term current use of insulin (HCC) 12/13/2019   COPD (chronic obstructive pulmonary disease) (HCC)    DJD (degenerative joint disease)    Fatty liver    Fibromyalgia    GERD (gastroesophageal reflux disease)    Hyperlipidemia    Hypertension    Hypothyroidism    S/P radiation   Left atrial enlargement    LA size 45mm by echo 11/21/11   Mitral regurgitation    trivial   Obstructive sleep apnea    mild by sleep study 2013; pt stated he does not have a machine because "it wasn't bad enough for  him to have one"   Pacemaker 03/31/2012   Pneumonia    last time 2023   Restless leg syndrome    Second degree AV block    Splenomegaly     Family History  Problem Relation Age of Onset   Heart failure Mother    Stroke Father    Neuropathy Neg Hx     Past Surgical History:  Procedure Laterality Date   AMPUTATION Right 08/23/2022   Procedure: RIGHT FOOT FIFTH RAY AMPUTATION;  Surgeon: [name], MD;  Location: [facility] OR;  Service: Orthopedics;  Laterality: Right;   AORTIC VALVE REPLACEMENT N/A 08/22/2015   Procedure: AORTIC VALVE REPLACEMENT (AVR);  Surgeon: Kerin Perna, MD;  Location: [facility] OR;  Service: Open Heart Surgery;  Laterality: N/A;   ATRIAL FIBRILLATION ABLATION  01/30/2012   PVI by Dr. Johney Frame   ATRIAL FIBRILLATION ABLATION N/A 01/31/2012   Procedure: ATRIAL FIBRILLATION ABLATION;  Surgeon: [name], MD;  Location: [facility] CATH LAB;  Service: Cardiovascular;  Laterality: N/A;   ATRIAL FIBRILLATION ABLATION N/A 04/19/2020   Procedure: ATRIAL FIBRILLATION ABLATION;  Surgeon: [name], MD;  Location: [facility] INVASIVE CV LAB;  Service: Cardiovascular;  Laterality: N/A;   BACK SURGERY     X 3   BIOPSY  01/01/2022   Procedure: BIOPSY;  Surgeon: Kathi Der, MD;  Location: [facility] ENDOSCOPY;  Service: Gastroenterology;;   CARDIAC CATHETERIZATION N/A 08/09/2015   Procedure: Right/Left Heart Cath and Coronary Angiography;  Surgeon: Peter M Swaziland, MD;  Location: [facility] INVASIVE CV LAB;  Service: Cardiovascular;  Laterality: N/A;   CARDIAC CATHETERIZATION N/A 02/05/2016   Procedure: Right/Left Heart Cath and Coronary Angiography;  Surgeon: Corky Crafts, MD;  Location: [facility] INVASIVE CV LAB;  Service: Cardiovascular;  Laterality: N/A;   CARDIAC CATHETERIZATION N/A 04/17/2016   Procedure: Right Heart Cath;  Surgeon: Dolores Patty, MD;  Location: [facility] INVASIVE CV LAB;  Service: Cardiovascular;  Laterality: N/A;   CARDIOVASCULAR STRESS TEST  03/12/2002   EF 48%, NO EVIDENCE OF  ISCHEMIA   CARDIOVERSION  01/2012; 03/31/2012   CARDIOVERSION N/A 02/25/2012   Procedure: CARDIOVERSION;  Surgeon: [name], MD;  Location: [facility] CATH LAB;  Service: Cardiovascular;  Laterality: N/A;   CARDIOVERSION N/A 03/31/2012   Procedure: CARDIOVERSION;  Surgeon: [name], MD;  Location: [facility] CATH LAB;  Service: Cardiovascular;  Laterality: N/A;   CARDIOVERSION N/A 11/23/2015   Procedure: CARDIOVERSION;  Surgeon: Laurey Morale, MD;  Location: [facility] ENDOSCOPY;  Service: Cardiovascular;  Laterality: N/A;   CARDIOVERSION N/A 04/08/2016   Procedure: CARDIOVERSION;  Surgeon: Dolores Patty, MD;  Location: [facility] ENDOSCOPY;  Service: Cardiovascular;  Laterality: N/A;   CARDIOVERSION N/A 09/14/2018   Procedure: CARDIOVERSION;  Surgeon: Wendall Stade, MD;  Location: [facility] ENDOSCOPY;  Service: Cardiovascular;  Laterality: N/A;   CARDIOVERSION N/A 10/19/2019   Procedure: CARDIOVERSION;  Surgeon: Dolores Patty, MD;  Location: [facility] ENDOSCOPY;  Service: Cardiovascular;  Laterality: N/A;   CARDIOVERSION N/A 02/25/2020   Procedure: CARDIOVERSION;  Surgeon: Pricilla Riffle, MD;  Location: [facility] ENDOSCOPY;  Service: Cardiovascular;  Laterality: N/A;   CARDIOVERSION N/A 05/25/2020   Procedure: CARDIOVERSION;  Surgeon: Lewayne Bunting, MD;  Location: [facility] ENDOSCOPY;  Service: Cardiovascular;  Laterality: N/A;   CARDIOVERSION N/A 07/10/2020   Procedure: CARDIOVERSION;  Surgeon: Jake Bathe, MD;  Location: [facility] ENDOSCOPY;  Service: Cardiovascular;  Laterality: N/A;   CLIPPING OF ATRIAL APPENDAGE N/A 08/22/2015   Procedure: CLIPPING OF ATRIAL APPENDAGE;  Surgeon: Kerin Perna, MD;  Location: [facility] OR;  Service: Open Heart Surgery;  Laterality: N/A;   COLONOSCOPY WITH PROPOFOL N/A 01/01/2022   Procedure: COLONOSCOPY WITH PROPOFOL;  Surgeon: Kathi Der, MD;  Location: [facility] ENDOSCOPY;  Service: Gastroenterology;  Laterality: N/A;   DOPPLER ECHOCARDIOGRAPHY  03/11/2002   EF 70-75%   FINGER SURGERY Left     Middle finger   FINGER TENDON REPAIR  1980's   "right little finger" (03/31/2012)   FLEXIBLE SIGMOIDOSCOPY N/A 12/30/2021   Procedure: FLEXIBLE SIGMOIDOSCOPY;  Surgeon: Charlott Rakes, MD;  Location: [facility] ENDOSCOPY;  Service: Gastroenterology;  Laterality:  N/A;   INSERT / REPLACE / REMOVE PACEMAKER     St Jude   IR RADIOLOGIST EVAL & MGMT  05/06/2019   IR RADIOLOGIST EVAL & MGMT  10/06/2019   IR RADIOLOGIST EVAL & MGMT  10/31/2020   IR RADIOLOGIST EVAL & MGMT  01/18/2021   IR RADIOLOGIST EVAL & MGMT  03/08/2021   IR RADIOLOGIST EVAL & MGMT  06/13/2021   KIDNEY SURGERY Left    LEFT AND RIGHT HEART CATHETERIZATION WITH CORONARY ANGIOGRAM N/A 10/27/2012   Procedure: LEFT AND RIGHT HEART CATHETERIZATION WITH CORONARY ANGIOGRAM;  Surgeon: Pamella Pert, MD;  Location: [facility] CATH LAB;  Service: Cardiovascular;  Laterality: N/A;   LUMBAR DISC SURGERY  1980's X2;  2000's   MAZE N/A 08/22/2015   Procedure: MAZE;  Surgeon: Kerin Perna, MD;  Location: [facility] OR;  Service: Open Heart Surgery;  Laterality: N/A;   PACEMAKER INSERTION  03/31/2012   STJ Accent DR pacemaker implanted by Dr Johney Frame   PERMANENT PACEMAKER INSERTION N/A 03/31/2012   Procedure: PERMANENT PACEMAKER INSERTION;  Surgeon: [name], MD;  Location: [facility] CATH LAB;  Service: Cardiovascular;  Laterality: N/A;   PPM GENERATOR CHANGEOUT N/A 08/08/2020   Procedure: PPM GENERATOR CHANGEOUT;  Surgeon: [name], MD;  Location: [facility] INVASIVE CV LAB;  Service: Cardiovascular;  Laterality: N/A;   RADIOLOGY WITH ANESTHESIA Left 02/14/2021   Procedure: RADIOLOGY WITH ANESTHESIA LEFT RENAL CRYOABLATION;  Surgeon: Irish Lack, MD;  Location: [facility] ORS;  Service: Radiology;  Laterality: Left;   TEE WITHOUT CARDIOVERSION  01/30/2012   Procedure: TRANSESOPHAGEAL ECHOCARDIOGRAM (TEE);  Surgeon: Laurey Morale, MD;  Location: [facility] ENDOSCOPY;  Service: Cardiovascular;  Laterality: N/A;  ablation next day   TEE WITHOUT CARDIOVERSION N/A 08/22/2015    Procedure: TRANSESOPHAGEAL ECHOCARDIOGRAM (TEE);  Surgeon: Kerin Perna, MD;  Location: [facility] OR;  Service: Open Heart Surgery;  Laterality: N/A;   TEE WITHOUT CARDIOVERSION N/A 10/19/2019   Procedure: TRANSESOPHAGEAL ECHOCARDIOGRAM (TEE);  Surgeon: Dolores Patty, MD;  Location: [facility] ENDOSCOPY;  Service: Cardiovascular;  Laterality: N/A;   US ECHOCARDIOGRAPHY  08/07/2009   EF 55-60%   Social History   Occupational History   Occupation: retired  Tobacco Use   Smoking status: Former    Packs/day: 1.00    Years: 25.00    Additional pack years: 0.00    Total pack years: 25.00    Types: Cigarettes    Start date: 1963    Quit date: 07/18/1991    Years since quitting: 31.1    Passive exposure: Never   Smokeless tobacco: Never  Vaping Use   Vaping Use: Never used  Substance and Sexual Activity   Alcohol use: Not Currently    Comment: occasional   Drug use: Yes    Types: Marijuana    Comment: Marijuana- for nausea   Sexual activity: Not Currently

## 2022-08-30 ENCOUNTER — Ambulatory Visit (INDEPENDENT_AMBULATORY_CARE_PROVIDER_SITE_OTHER): Payer: Medicare Other | Admitting: Family

## 2022-08-30 ENCOUNTER — Encounter: Payer: Self-pay | Admitting: Family

## 2022-08-30 VITALS — Ht 71.0 in | Wt 179.0 lb

## 2022-08-30 DIAGNOSIS — Z89431 Acquired absence of right foot: Secondary | ICD-10-CM

## 2022-08-30 DIAGNOSIS — L97511 Non-pressure chronic ulcer of other part of right foot limited to breakdown of skin: Secondary | ICD-10-CM

## 2022-08-30 MED ORDER — OXYCODONE-ACETAMINOPHEN 5-325 MG PO TABS: 1.0000 | ORAL_TABLET | Freq: Four times a day (QID) | ORAL | 0 refills | Status: DC | PRN

## 2022-08-30 NOTE — Progress Notes (Signed)
Post-Op Visit Note   Patient: [name]           Date of Birth: Jan 01, 1951           MRN: [id number] Visit Date: 08/30/2022 PCP: Alysia Penna, MD  Chief Complaint:  Chief Complaint  Patient presents with   Right Foot - Routine Post Op    08/23/2022 right 5th ray amputation    HPI:  HPI The patient is a 72 year old gentleman who is seen for postoperative follow-up status post right fifth ray amputation April 26.  He states he has been doing all right he does have pain issues but his oxycodone has been ameliorating this. Ortho Exam On examination of the right fifth ray amputation sutures are in place this is well-approximated he does have some mild ischemic changes about the incision with scant bloody drainage there is no erythema warmth or edema  Visit Diagnoses: No diagnosis found.  Plan: Begin daily Dial soap cleansing.  Dry dressings.  He will follow-up in 2 weeks nonweightbearing.  Follow-Up Instructions: No follow-ups on file.   Imaging: No results found.  Orders:  No orders of the defined types were placed in this encounter.  No orders of the defined types were placed in this encounter.    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) 08/23/2022   CAP (community acquired pneumonia) 12/28/2021   Constipation 12/28/2021   Pain in both lower extremities 07/03/2021   Peripheral neuropathy 07/03/2021   Acute metabolic encephalopathy 06/09/2021   Marijuana dependence (HCC) 06/09/2021   DNR (do not resuscitate) 06/09/2021   Chest pain 06/08/2021   Renal lesion 02/14/2021   Status post cryoablation 02/14/2021   Allergic rhinitis 12/08/2020   Preop pulmonary/respiratory exam 03/27/2020   Acute on chronic diastolic (congestive) heart failure (HCC) 01/22/2020   Abdominal pain 01/22/2020   Hypothyroidism 12/13/2019   DM2 (diabetes mellitus, type 2) (HCC) 12/13/2019   Chronic kidney  disease, stage 3b (HCC) 12/13/2019   Acute bacterial bronchitis 12/13/2019   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   Typical atrial flutter (HCC)    Chronic respiratory failure with hypoxia (HCC) 05/23/2016   CAD (coronary artery disease) 05/23/2016   Persistent atrial fibrillation (HCC)    Atypical atrial flutter (HCC)    Visit for monitoring Tikosyn therapy 04/16/2016   Chronic diastolic CHF (congestive heart failure) (HCC) 02/03/2016   Cough 12/06/2015   Acute on chronic respiratory failure with hypoxia (HCC) 09/08/2015   Hallucinations 09/04/2015   Lymphadenopathy 09/04/2015   Ascending aortic aneurysm (HCC) 09/04/2015   Hypoxia 09/02/2015   S/P aortic valve replacement with bioprosthetic valve 09/02/2015   Pleural effusion 09/02/2015   S/P AVR 08/22/2015   GERD without esophagitis 09/20/2014   Intrinsic asthma 10/13/2013   Dyspnea 09/15/2013   Pulmonary hypertension (HCC) 09/15/2013   Shortness of breath 05/05/2013   Pacemaker-St.Jude 04/01/2012   Bradycardia 03/31/2012   Second degree AV block 03/31/2012   AV block, 1st degree 02/01/2012   PAF (paroxysmal atrial fibrillation) (HCC) 11/13/2011   Fatigue 11/13/2011   Hypertension 11/13/2011   Aortic stenosis 11/13/2011   Sleep apnea 11/13/2011   Past Medical History:  Diagnosis Date   Aortic stenosis, mild    Arthritis of hand    "just a little bit in both hands" (03/31/2012)   Asthma    "little bit" (03/31/2012)   Atrial fibrillation (HCC)    dx '04; DCCV '04, placed  on flecainide, failed DCCV 04/2010, flecainide stopped; s/p successful A.fib ablation 01/31/12   CHF (congestive heart failure) (HCC)    Controlled type 2 diabetes mellitus without complication, without long-term current use of insulin (HCC) 12/13/2019   COPD (chronic obstructive pulmonary disease) (HCC)    DJD (degenerative joint disease)    Fatty liver    Fibromyalgia    GERD (gastroesophageal reflux disease)    Hyperlipidemia    Hypertension     Hypothyroidism    S/P radiation   Left atrial enlargement    LA size 45mm by echo 11/21/11   Mitral regurgitation    trivial   Obstructive sleep apnea    mild by sleep study 2013; pt stated he does not have a machine because "it wasn't bad enough for him to have one"   Pacemaker 03/31/2012   Pneumonia    last time 2023   Restless leg syndrome    Second degree AV block    Splenomegaly     Family History  Problem Relation Age of Onset   Heart failure Mother    Stroke Father    Neuropathy Neg Hx     Past Surgical History:  Procedure Laterality Date   AMPUTATION Right 08/23/2022   Procedure: RIGHT FOOT FIFTH RAY AMPUTATION;  Surgeon: [name], MD;  Location: [facility] OR;  Service: Orthopedics;  Laterality: Right;   AORTIC VALVE REPLACEMENT N/A 08/22/2015   Procedure: AORTIC VALVE REPLACEMENT (AVR);  Surgeon: Kerin Perna, MD;  Location: [facility] OR;  Service: Open Heart Surgery;  Laterality: N/A;   ATRIAL FIBRILLATION ABLATION  01/30/2012   PVI by Dr. Johney Frame   ATRIAL FIBRILLATION ABLATION N/A 01/31/2012   Procedure: ATRIAL FIBRILLATION ABLATION;  Surgeon: [name], MD;  Location: [facility] CATH LAB;  Service: Cardiovascular;  Laterality: N/A;   ATRIAL FIBRILLATION ABLATION N/A 04/19/2020   Procedure: ATRIAL FIBRILLATION ABLATION;  Surgeon: [name], MD;  Location: [facility] INVASIVE CV LAB;  Service: Cardiovascular;  Laterality: N/A;   BACK SURGERY     X 3   BIOPSY  01/01/2022   Procedure: BIOPSY;  Surgeon: Kathi Der, MD;  Location: [facility] ENDOSCOPY;  Service: Gastroenterology;;   CARDIAC CATHETERIZATION N/A 08/09/2015   Procedure: Right/Left Heart Cath and Coronary Angiography;  Surgeon: Peter M Swaziland, MD;  Location: [facility] INVASIVE CV LAB;  Service: Cardiovascular;  Laterality: N/A;   CARDIAC CATHETERIZATION N/A 02/05/2016   Procedure: Right/Left Heart Cath and Coronary Angiography;  Surgeon: Corky Crafts, MD;  [facility] INVASIVE CV LAB;  Service: Cardiovascular;  Laterality:  N/A;   CARDIAC CATHETERIZATION N/A 04/17/2016   Procedure: Right Heart Cath;  Surgeon: Dolores Patty, MD;  Location: [facility] INVASIVE CV LAB;  Service: Cardiovascular;  Laterality: N/A;   CARDIOVASCULAR STRESS TEST  03/12/2002   EF 48%, NO EVIDENCE OF ISCHEMIA   CARDIOVERSION  01/2012; 03/31/2012   CARDIOVERSION N/A 02/25/2012   Procedure: CARDIOVERSION;  Surgeon: [name], MD;  Location: [facility] CATH LAB;  Service: Cardiovascular;  Laterality: N/A;   CARDIOVERSION N/A 03/31/2012   Procedure: CARDIOVERSION;  Surgeon: [name], MD;  Location: [facility] CATH LAB;  Service: Cardiovascular;  Laterality: N/A;   CARDIOVERSION N/A 11/23/2015   Procedure: CARDIOVERSION;  Surgeon: Laurey Morale, MD;  Location: [facility] ENDOSCOPY;  Service: Cardiovascular;  Laterality: N/A;   CARDIOVERSION N/A 04/08/2016   Procedure: CARDIOVERSION;  Surgeon: Dolores Patty, MD;  Location: [facility] ENDOSCOPY;  Service: Cardiovascular;  Laterality: N/A;   CARDIOVERSION N/A 09/14/2018   Procedure: CARDIOVERSION;  Surgeon:  Wendall Stade, MD;  Location: [facility] ENDOSCOPY;  Service: Cardiovascular;  Laterality: N/A;   CARDIOVERSION N/A 10/19/2019   Procedure: CARDIOVERSION;  Surgeon: Dolores Patty, MD;  Location: [facility] ENDOSCOPY;  Service: Cardiovascular;  Laterality: N/A;   CARDIOVERSION N/A 02/25/2020   Procedure: CARDIOVERSION;  Surgeon: Pricilla Riffle, MD;  Location: [facility] ENDOSCOPY;  Service: Cardiovascular;  Laterality: N/A;   CARDIOVERSION N/A 05/25/2020   Procedure: CARDIOVERSION;  Surgeon: Lewayne Bunting, MD;  Location: [facility] ENDOSCOPY;  Service: Cardiovascular;  Laterality: N/A;   CARDIOVERSION N/A 07/10/2020   Procedure: CARDIOVERSION;  Surgeon: Jake Bathe, MD;  Location: [facility] ENDOSCOPY;  Service: Cardiovascular;  Laterality: N/A;   CLIPPING OF ATRIAL APPENDAGE N/A 08/22/2015   Procedure: CLIPPING OF ATRIAL APPENDAGE;  Surgeon: Kerin Perna, MD;  Location: [facility] OR;  Service: Open Heart Surgery;  Laterality: N/A;    COLONOSCOPY WITH PROPOFOL N/A 01/01/2022   Procedure: COLONOSCOPY WITH PROPOFOL;  Surgeon: Kathi Der, MD;  Location: [facility] ENDOSCOPY;  Service: Gastroenterology;  Laterality: N/A;   DOPPLER ECHOCARDIOGRAPHY  03/11/2002   EF 70-75%   FINGER SURGERY Left    Middle finger   FINGER TENDON REPAIR  1980's   "right little finger" (03/31/2012)   FLEXIBLE SIGMOIDOSCOPY N/A 12/30/2021   Procedure: FLEXIBLE SIGMOIDOSCOPY;  Surgeon: Charlott Rakes, MD;  Location: [facility] ENDOSCOPY;  Service: Gastroenterology;  Laterality: N/A;   INSERT / REPLACE / REMOVE PACEMAKER     St Jude   IR RADIOLOGIST EVAL & MGMT  05/06/2019   IR RADIOLOGIST EVAL & MGMT  10/06/2019   IR RADIOLOGIST EVAL & MGMT  10/31/2020   IR RADIOLOGIST EVAL & MGMT  01/18/2021   IR RADIOLOGIST EVAL & MGMT  03/08/2021   IR RADIOLOGIST EVAL & MGMT  06/13/2021   KIDNEY SURGERY Left    LEFT AND RIGHT HEART CATHETERIZATION WITH CORONARY ANGIOGRAM N/A 10/27/2012   Procedure: LEFT AND RIGHT HEART CATHETERIZATION WITH CORONARY ANGIOGRAM;  Surgeon: Pamella Pert, MD;  Location: [facility] CATH LAB;  Service: Cardiovascular;  Laterality: N/A;   LUMBAR DISC SURGERY  1980's X2;  2000's   MAZE N/A 08/22/2015   Procedure: MAZE;  Surgeon: Kerin Perna, MD;  Location: [facility] OR;  Service: Open Heart Surgery;  Laterality: N/A;   PACEMAKER INSERTION  03/31/2012   STJ Accent DR pacemaker implanted by Dr Johney Frame   PERMANENT PACEMAKER INSERTION N/A 03/31/2012   Procedure: PERMANENT PACEMAKER INSERTION;  Surgeon: [name], MD;  Location: [facility] CATH LAB;  Service: Cardiovascular;  Laterality: N/A;   PPM GENERATOR CHANGEOUT N/A 08/08/2020   Procedure: PPM GENERATOR CHANGEOUT;  Surgeon: [name], MD;  Location: [facility] INVASIVE CV LAB;  Service: Cardiovascular;  Laterality: N/A;   RADIOLOGY WITH ANESTHESIA Left 02/14/2021   Procedure: RADIOLOGY WITH ANESTHESIA LEFT RENAL CRYOABLATION;  Surgeon: Irish Lack, MD;  Location: [facility] ORS;  Service: Radiology;  Laterality:  Left;   TEE WITHOUT CARDIOVERSION  01/30/2012   Procedure: TRANSESOPHAGEAL ECHOCARDIOGRAM (TEE);  Surgeon: Laurey Morale, MD;  Location: [facility] ENDOSCOPY;  Service: Cardiovascular;  Laterality: N/A;  ablation next day   TEE WITHOUT CARDIOVERSION N/A 08/22/2015   Procedure: TRANSESOPHAGEAL ECHOCARDIOGRAM (TEE);  Surgeon: Kerin Perna, MD;  Location: [facility] OR;  Service: Open Heart Surgery;  Laterality: N/A;   TEE WITHOUT CARDIOVERSION N/A 10/19/2019   Procedure: TRANSESOPHAGEAL ECHOCARDIOGRAM (TEE);  Surgeon: Dolores Patty, MD;  Location: [facility] ENDOSCOPY;  Service: Cardiovascular;  Laterality: N/A;   US ECHOCARDIOGRAPHY  08/07/2009   EF 55-60%   Social History  Occupational History   Occupation: retired  Tobacco Use   Smoking status: Former    Packs/day: 1.00    Years: 25.00    Additional pack years: 0.00    Total pack years: 25.00    Types: Cigarettes    Start date: 1963    Quit date: 07/18/1991    Years since quitting: 31.1    Passive exposure: Never   Smokeless tobacco: Never  Vaping Use   Vaping Use: Never used  Substance and Sexual Activity   Alcohol use: Not Currently    Comment: occasional   Drug use: Yes    Types: Marijuana    Comment: Marijuana- for nausea   Sexual activity: Not Currently

## 2022-09-02 ENCOUNTER — Ambulatory Visit (INDEPENDENT_AMBULATORY_CARE_PROVIDER_SITE_OTHER): Payer: Medicare Other | Admitting: Orthopedic Surgery

## 2022-09-02 DIAGNOSIS — I96 Gangrene, not elsewhere classified: Secondary | ICD-10-CM

## 2022-09-03 ENCOUNTER — Encounter: Payer: Self-pay | Admitting: Cardiovascular Disease

## 2022-09-03 ENCOUNTER — Encounter: Payer: Self-pay | Admitting: Orthopedic Surgery

## 2022-09-03 NOTE — Progress Notes (Signed)
I requested Pre op device orders. 

## 2022-09-03 NOTE — Progress Notes (Signed)
PERIOPERATIVE PRESCRIPTION FOR IMPLANTED CARDIAC DEVICE PROGRAMMING  Patient Information: Name:  [name]  DOB:  May 28, 1950  MRN:  [id number]    Planned Procedure:  Right BKA  Surgeon:  Dr. Aldean Baker  Date of Procedure:  09/04/22  Cautery will be used.  Position during surgery:  supine   Please send documentation back to:  [facility] (Fax # [phone])   Device Information:  Clinic EP Physician:  Dr. York Pellant   Device Type:  Pacemaker Manufacturer and Phone #:  St. Jude/Abbott: [phone] Pacemaker Dependent?:  Yes Date of Last Device Check:  08/22/22 Normal Device Function?:  Yes.    Electrophysiologist's Recommendations:  Have magnet available. Provide continuous ECG monitoring when magnet is used or reprogramming is to be performed.  Procedure should not interfere with device function.  No device programming or magnet placement needed.  Per Device Clinic Standing Orders, Lenor Coffin, RN  8:51 AM 09/03/2022

## 2022-09-03 NOTE — Progress Notes (Addendum)
I spoke with [name] wife, [name] per patient's request. [name] reports that Mr. [name] has not complained of shortness of breath or chest pain.  [name] denies having any s/s of Covid in her household, also denies any known exposure to Covid. Karel Stege reports that Mr. [name] has not had an any s/s of upper or lower respiratory infectionin the past 8 weeks.   Mr. [name] has type II diabetes, I instructed [name] to have patient hold Jardiance tonight. I instructed Mrs. [name] to have Mr. [name] to check CBG after awaking and every 2 hours until arrival  to the hospital. I instructed that if CBG is less than 70 to take 4 Glucose Tablets or 1 tube of Glucose Gel or 1/2 cup of a clear juice. Recheck CBG in 15 minutes if CBG is not over 70 call, pre- op desk at [phone] for further instructions.   Mr. [name] 's PCP is Dr. Eugenie Norrie, cardiologist is Dr. Gala Romney.  I received device orders for patient's Pacemaker, I called [name], had [name], to give him a head's up that Mr. Caltrider will be in the hospital, but we should not need a rep here. [name] from [place] called me and I gave her the information I have for Mr,. [name].

## 2022-09-03 NOTE — H&P (Signed)
[name] is an 72 y.o. male.   Chief Complaint: Progressive gangrenous ulceration lateral right foot HPI: Patient is a 72 year old gentleman is 2 weeks status post a right foot fifth ray amputation. Patient initially presented with ulcer beneath the fifth metatarsal head with extended down to bone with osteomyelitis. Patient has strong palpable dorsalis pedis pulse. Patient underwent fifth ray amputation. Patient states he was ambulating at home became unsteady with his crutches and struck his foot. Patient is currently on Eliquis for a cardiac valve.   Past Medical History:  Diagnosis Date   Aortic stenosis, mild    Arthritis of hand    "just a little bit in both hands" (03/31/2012)   Asthma    "little bit" (03/31/2012)   Atrial fibrillation (HCC)    dx '04; DCCV '04, placed on flecainide, failed DCCV 04/2010, flecainide stopped; s/p successful A.fib ablation 01/31/12   CHF (congestive heart failure) (HCC)    Controlled type 2 diabetes mellitus without complication, without long-term current use of insulin (HCC) 12/13/2019   COPD (chronic obstructive pulmonary disease) (HCC)    DJD (degenerative joint disease)    Fatty liver    Fibromyalgia    GERD (gastroesophageal reflux disease)    Hyperlipidemia    Hypertension    Hypothyroidism    S/P radiation   Left atrial enlargement    LA size 45mm by echo 11/21/11   Mitral regurgitation    trivial   Obstructive sleep apnea    mild by sleep study 2013; pt stated he does not have a machine because "it wasn't bad enough for him to have one"   Pacemaker 03/31/2012   Pneumonia    last time 2023   Restless leg syndrome    Second degree AV block    Splenomegaly     Past Surgical History:  Procedure Laterality Date   AMPUTATION Right 08/23/2022   Procedure: RIGHT FOOT FIFTH RAY AMPUTATION;  Surgeon: [name], MD;  Location: [facility] OR;  Service: Orthopedics;  Laterality: Right;   AORTIC VALVE REPLACEMENT N/A 08/22/2015   Procedure:  AORTIC VALVE REPLACEMENT (AVR);  Surgeon: Kerin Perna, MD;  Location: [facility] OR;  Service: Open Heart Surgery;  Laterality: N/A;   ATRIAL FIBRILLATION ABLATION  01/30/2012   PVI by Dr. Johney Frame   ATRIAL FIBRILLATION ABLATION N/A 01/31/2012   Procedure: ATRIAL FIBRILLATION ABLATION;  Surgeon: [name], MD;  Location: [facility] CATH LAB;  Service: Cardiovascular;  Laterality: N/A;   ATRIAL FIBRILLATION ABLATION N/A 04/19/2020   Procedure: ATRIAL FIBRILLATION ABLATION;  Surgeon: [name], MD;  Location: [facility] INVASIVE CV LAB;  Service: Cardiovascular;  Laterality: N/A;   BACK SURGERY     X 3   BIOPSY  01/01/2022   Procedure: BIOPSY;  Surgeon: Kathi Der, MD;  Location: [facility] ENDOSCOPY;  Service: Gastroenterology;;   CARDIAC CATHETERIZATION N/A 08/09/2015   Procedure: Right/Left Heart Cath and Coronary Angiography;  Surgeon: Peter M Swaziland, MD;  Location: [facility] INVASIVE CV LAB;  Service: Cardiovascular;  Laterality: N/A;   CARDIAC CATHETERIZATION N/A 02/05/2016   Procedure: Right/Left Heart Cath and Coronary Angiography;  Surgeon: Corky Crafts, MD;  Location: [facility] INVASIVE CV LAB;  Service: Cardiovascular;  Laterality: N/A;   CARDIAC CATHETERIZATION N/A 04/17/2016   Procedure: Right Heart Cath;  Surgeon: Dolores Patty, MD;  Location: [facility] INVASIVE CV LAB;  Service: Cardiovascular;  Laterality: N/A;   CARDIOVASCULAR STRESS TEST  03/12/2002   EF 48%, NO EVIDENCE OF ISCHEMIA   CARDIOVERSION  01/2012;  03/31/2012   CARDIOVERSION N/A 02/25/2012   Procedure: CARDIOVERSION;  Surgeon: [name], MD;  Location: [facility] CATH LAB;  Service: Cardiovascular;  Laterality: N/A;   CARDIOVERSION N/A 03/31/2012   Procedure: CARDIOVERSION;  Surgeon: [name], MD;  Location: [facility] CATH LAB;  Service: Cardiovascular;  Laterality: N/A;   CARDIOVERSION N/A 11/23/2015   Procedure: CARDIOVERSION;  Surgeon: Laurey Morale, MD;  Location: [facility] ENDOSCOPY;  Service: Cardiovascular;  Laterality: N/A;   CARDIOVERSION N/A  04/08/2016   Procedure: CARDIOVERSION;  Surgeon: Dolores Patty, MD;  Location: [facility] ENDOSCOPY;  Service: Cardiovascular;  Laterality: N/A;   CARDIOVERSION N/A 09/14/2018   Procedure: CARDIOVERSION;  Surgeon: Wendall Stade, MD;  Location: [facility] ENDOSCOPY;  Service: Cardiovascular;  Laterality: N/A;   CARDIOVERSION N/A 10/19/2019   Procedure: CARDIOVERSION;  Surgeon: Dolores Patty, MD;  Location: [facility] ENDOSCOPY;  Service: Cardiovascular;  Laterality: N/A;   CARDIOVERSION N/A 02/25/2020   Procedure: CARDIOVERSION;  Surgeon: Pricilla Riffle, MD;  Location: [facility] ENDOSCOPY;  Service: Cardiovascular;  Laterality: N/A;   CARDIOVERSION N/A 05/25/2020   Procedure: CARDIOVERSION;  Surgeon: Lewayne Bunting, MD;  Location: [facility] ENDOSCOPY;  Service: Cardiovascular;  Laterality: N/A;   CARDIOVERSION N/A 07/10/2020   Procedure: CARDIOVERSION;  Surgeon: Jake Bathe, MD;  Location: [facility] ENDOSCOPY;  Service: Cardiovascular;  Laterality: N/A;   CLIPPING OF ATRIAL APPENDAGE N/A 08/22/2015   Procedure: CLIPPING OF ATRIAL APPENDAGE;  Surgeon: Kerin Perna, MD;  Location: [facility] OR;  Service: Open Heart Surgery;  Laterality: N/A;   COLONOSCOPY WITH PROPOFOL N/A 01/01/2022   Procedure: COLONOSCOPY WITH PROPOFOL;  Surgeon: Kathi Der, MD;  Location: [facility] ENDOSCOPY;  Service: Gastroenterology;  Laterality: N/A;   DOPPLER ECHOCARDIOGRAPHY  03/11/2002   EF 70-75%   FINGER SURGERY Left    Middle finger   FINGER TENDON REPAIR  1980's   "right little finger" (03/31/2012)   FLEXIBLE SIGMOIDOSCOPY N/A 12/30/2021   Procedure: FLEXIBLE SIGMOIDOSCOPY;  Surgeon: Charlott Rakes, MD;  Location: [facility] ENDOSCOPY;  Service: Gastroenterology;  Laterality: N/A;   INSERT / REPLACE / REMOVE PACEMAKER     St Jude   IR RADIOLOGIST EVAL & MGMT  05/06/2019   IR RADIOLOGIST EVAL & MGMT  10/06/2019   IR RADIOLOGIST EVAL & MGMT  10/31/2020   IR RADIOLOGIST EVAL & MGMT  01/18/2021   IR RADIOLOGIST EVAL & MGMT  03/08/2021   IR RADIOLOGIST  EVAL & MGMT  06/13/2021   KIDNEY SURGERY Left    LEFT AND RIGHT HEART CATHETERIZATION WITH CORONARY ANGIOGRAM N/A 10/27/2012   Procedure: LEFT AND RIGHT HEART CATHETERIZATION WITH CORONARY ANGIOGRAM;  Surgeon: Pamella Pert, MD;  Location: [facility] CATH LAB;  Service: Cardiovascular;  Laterality: N/A;   LUMBAR DISC SURGERY  1980's X2;  2000's   MAZE N/A 08/22/2015   Procedure: MAZE;  Surgeon: Kerin Perna, MD;  Location: [facility] OR;  Service: Open Heart Surgery;  Laterality: N/A;   PACEMAKER INSERTION  03/31/2012   STJ Accent DR pacemaker implanted by Dr Johney Frame   PERMANENT PACEMAKER INSERTION N/A 03/31/2012   Procedure: PERMANENT PACEMAKER INSERTION;  Surgeon: [name], MD;  Location: [facility] CATH LAB;  Service: Cardiovascular;  Laterality: N/A;   PPM GENERATOR CHANGEOUT N/A 08/08/2020   Procedure: PPM GENERATOR CHANGEOUT;  Surgeon: [name], MD;  Location: [facility] INVASIVE CV LAB;  Service: Cardiovascular;  Laterality: N/A;   RADIOLOGY WITH ANESTHESIA Left 02/14/2021   Procedure: RADIOLOGY WITH ANESTHESIA LEFT RENAL CRYOABLATION;  Surgeon: Irish Lack, MD;  Location: [facility] ORS;  Service: Radiology;  Laterality: Left;   TEE WITHOUT CARDIOVERSION  01/30/2012   Procedure: TRANSESOPHAGEAL ECHOCARDIOGRAM (TEE);  Surgeon: Laurey Morale, MD;  Location: [facility] ENDOSCOPY;  Service: Cardiovascular;  Laterality: N/A;  ablation next day   TEE WITHOUT CARDIOVERSION N/A 08/22/2015   Procedure: TRANSESOPHAGEAL ECHOCARDIOGRAM (TEE);  Surgeon: Kerin Perna, MD;  Location: [facility] OR;  Service: Open Heart Surgery;  Laterality: N/A;   TEE WITHOUT CARDIOVERSION N/A 10/19/2019   Procedure: TRANSESOPHAGEAL ECHOCARDIOGRAM (TEE);  Surgeon: Dolores Patty, MD;  Location: [facility] ENDOSCOPY;  Service: Cardiovascular;  Laterality: N/A;   US ECHOCARDIOGRAPHY  08/07/2009   EF 55-60%    Family History  Problem Relation Age of Onset   Heart failure Mother    Stroke Father    Neuropathy Neg Hx    Social History:  reports that  he quit smoking about 31 years ago. His smoking use included cigarettes. He started smoking about 61 years ago. He has a 25.00 pack-year smoking history. He has never been exposed to tobacco smoke. He has never used smokeless tobacco. He reports that he does not currently use alcohol. He reports current drug use. Drug: Marijuana.  Allergies:  Allergies  Allergen Reactions   Meloxicam Rash and Other (See Comments)    Red Man Syndrome, also   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 syndrome 09/02/15, resolved with diphenhydramine and slowing of rate    No medications prior to admission.    No results found for this or any previous visit (from the past 48 hour(s)). No results found.  Review of Systems  All other systems reviewed and are negative.   There were no vitals taken for this visit. Physical Exam  Patient is alert, oriented, no adenopathy, well-dressed, normal affect, normal respiratory effort. Examination patient has a strong palpable dorsalis pedis pulse no acute arterial insufficiency.  He has a large necrotic wound over the lateral aspect of his foot that measures 10 x 4 cm.  There is no ascending cellulitis he does have chronic venous stasis changes Assessment/Plan 1. Gangrene of right foot (HCC)       Plan: With the progressive gangrenous changes of the entire lateral aspect of the right foot patient does not have foot salvage intervention options.  Recommended proceeding with a transtibial amputation.  Patient states he understands wished to proceed with surgery on Wednesday.  [name], MD 09/03/2022, 5:05 PM

## 2022-09-03 NOTE — Progress Notes (Signed)
Patient was called to informed that the surgery time for tomorrow was changed to 10:00 o'clock. Patient wasn't available and this writer left a message. Patient was instructed to be at the hospital at 07:30 o'clock and stop drinking clear liquids at 07:00 o'clock.

## 2022-09-03 NOTE — Progress Notes (Signed)
Anesthesia Chart Review: Same day workup  Follows cardiology for history of permanent atrial fibrillation s/p MAZE procedure 2017 and ablation 2013 and 2021, bioprosthetic AVR 2017, OSA on CPAP, CHB s/p Abbott single-chamber pacemaker.  Last seen by EP cardiologist Dr. Nelly Laurence on 08/22/2022 for preop evaluation prior to undergoing fifth ray amputation.  Per note, "Pt is device-dependent, but the operative site is well away from the device. Out of extreme caution, a magnet may be placed on the device to ensure there will not be inappropriate inhibition."  Non-insulin-dependent DM2, A1c 6.5 on 12/27/2021.  Former smoker with associated moderate COPD and asthma.  Maintained on Breztri Aerosphere, montelukast, and as needed albuterol.  History of CKD followed by nephrology.  BMP and CBC from 08/23/2022 reviewed.  Creatinine elevated 1.91 (baseline appears to be ~1.4-1.8), otherwise unremarkable.  EKG 08/22/2022: Ventricular paced rhythm.  Rate 62.  Perioperative prescription for implanted cardiac device programming per progress note 09/03/2022: Device Information:   Clinic EP Physician:  Dr. York Pellant    Device Type:  Pacemaker Manufacturer and Phone #:  St. Jude/Abbott: [phone] Pacemaker Dependent?:  Yes Date of Last Device Check:  08/22/22           Normal Device Function?:  Yes.     Electrophysiologist's Recommendations:   Have magnet available. Provide continuous ECG monitoring when magnet is used or reprogramming is to be performed.  Procedure should not interfere with device function.  No device programming or magnet placement needed.  TTE 06/10/2021:  1. Left ventricular ejection fraction, by estimation, is 55 to 60%. The  left ventricle has normal function. The left ventricle has no regional  wall motion abnormalities. Left ventricular diastolic parameters are  indeterminate.   2. Right ventricular systolic function is normal. The right ventricular  size is normal. The  estimated right ventricular systolic pressure is 32.4  mmHg.   3. The mitral valve is normal in structure. Trivial mitral valve  regurgitation. No evidence of mitral stenosis.   4. The aortic valve has been repaired/replaced. Aortic valve  regurgitation is not visualized. No aortic stenosis is present. There is a  bioprosthetic valve present in the aortic position. Aortic valve area, by  VTI measures 1.63 cm. Aortic valve Vmax  measures 2.28 m/s. Aortic valve mean gradient measures 11.0 mmHg. Aortic  valve peak gradient measures 20.8 mmHg.   5. There is mild dilatation of the ascending aorta, measuring 38 mm.   6. The inferior vena cava is normal in size with greater than 50%  respiratory variability, suggesting right atrial pressure of 3 mmHg.   7. Compared to echo 12/13/2019, the mean AVG has increased from 8 to  . DVi has dcreased from 0.65 to 0.57 and AVA has decreased from  2.04cm2 to 1.63cm2. The AVA calculation is likely underestimated due to  small LVOT measurement.   8. Left atrial size was mildly dilated.   R/L Cath 02/05/2016: Mid RCA lesion, 10 %stenosed. Ost Cx to Prox Cx lesion, 20 %stenosed. LV end diastolic pressure is moderately elevated. Hemodynamic findings consistent with severe pulmonary hypertension. No evidence of discordance on the LV/RV tracing. No evidence of pericardial constriction. Severly elevated PCWP. PA sat 51%. CO 6.2 L/min. Cardiac index 2.87. No significant aortic valve gradient. There is no aortic valve stenosis.   Discussed with Dr. Elease Hashimoto.  Plan for additional diuresis and possible CHF consult.      Zannie Cove [facility] Stay [facility]/Anesthesiology Phone [phone] 09/03/2022 11:18 AM

## 2022-09-03 NOTE — Progress Notes (Signed)
Office Visit Note   Patient: [name]           Date of Birth: [birth year]           MRN: [id number] Visit Date: 09/02/2022              Requested by: Alysia Penna, MD [address] [place],  [place] [address] PCP: Alysia Penna, MD  Chief Complaint  Patient presents with   Right Foot - Routine Post Op    08/23/22 right 5th ray amputation      HPI: Patient is a 72 year old gentleman is 2 weeks status post a right foot fifth ray amputation.  Patient initially presented with ulcer beneath the fifth metatarsal head with extended down to bone with osteomyelitis.  Patient has strong palpable dorsalis pedis pulse.  Patient underwent fifth ray amputation.  Patient states he was ambulating at home became unsteady with his crutches and struck his foot.  Patient is currently on Eliquis for a cardiac valve.  Assessment & Plan: Visit Diagnoses:  1. Gangrene of right foot (HCC)     Plan: With the progressive gangrenous changes of the entire lateral aspect of the right foot patient does not have foot salvage intervention options.  Recommended proceeding with a transtibial amputation.  Patient states he understands wished to proceed with surgery on Wednesday.  Follow-Up Instructions: Return in about 2 weeks (around 09/16/2022).   Ortho Exam  Patient is alert, oriented, no adenopathy, well-dressed, normal affect, normal respiratory effort. Examination patient has a strong palpable dorsalis pedis pulse no acute arterial insufficiency.  He has a large necrotic wound over the lateral aspect of his foot that measures 10 x 4 cm.  There is no ascending cellulitis he does have chronic venous stasis changes.  Imaging: No results found. No images are attached to the encounter.  Labs: Lab Results  Component Value Date   HGBA1C 6.5 (H) 12/27/2021   HGBA1C 6.6 (H) 06/11/2021   HGBA1C 6.7 (H) 02/02/2021   ESRSEDRATE 60 (H) 09/04/2015   REPTSTATUS 01/31/2022 FINAL 01/25/2022   GRAMSTAIN   09/04/2015    RARE WBC PRESENT, PREDOMINANTLY PMN NO ORGANISMS SEEN    CULT  01/25/2022    NO GROWTH 5 DAYS Performed at [facility], [address] [place], [place] [address]    LABORGA ESCHERICHIA COLI (A) 12/12/2019   LABORGA ENTEROCOCCUS FAECALIS (A) 12/12/2019     Lab Results  Component Value Date   ALBUMIN 4.2 06/20/2022   ALBUMIN 3.8 05/13/2022   ALBUMIN 3.9 01/28/2022    Lab Results  Component Value Date   MG 2.0 06/11/2021   MG 2.3 12/10/2020   MG 2.4 05/29/2020   No results found for: "VD25OH"  No results found for: "PREALBUMIN"    Latest Ref Rng & Units 08/23/2022   12:36 PM 06/20/2022    9:20 AM 05/13/2022    1:00 PM  CBC EXTENDED  WBC 4.0 - 10.5 K/uL 8.5  7.6  10.7   RBC 4.22 - 5.81 MIL/uL 4.16  4.80  4.66   Hemoglobin 13.0 - 17.0 g/dL 40.3  47.4  25.9   HCT 39.0 - 52.0 % 40.3  45.7  43.6   Platelets 150 - 400 K/uL 164  124  112   NEUT# 1.7 - 7.7 K/uL  6.5    Lymph# 0.7 - 4.0 K/uL  0.6       There is no height or weight on file to calculate BMI.  Orders:  No orders of the defined types were placed in this encounter.  No orders of the defined types were placed in this encounter.    Procedures: No procedures performed  Clinical Data: No additional findings.  ROS:  All other systems negative, except as noted in the HPI. Review of Systems  Objective: Vital Signs: There were no vitals taken for this visit.  Specialty Comments:  No specialty comments available.  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) 08/23/2022   CAP (community acquired pneumonia) 12/28/2021   Constipation 12/28/2021   Pain in both lower extremities 07/03/2021   Peripheral neuropathy 07/03/2021   Acute metabolic encephalopathy 06/09/2021   Marijuana dependence (HCC) 06/09/2021   DNR (do not resuscitate) 06/09/2021   Chest pain 06/08/2021   Renal lesion 02/14/2021   Status post  cryoablation 02/14/2021   Allergic rhinitis 12/08/2020   Preop pulmonary/respiratory exam 03/27/2020   Acute on chronic diastolic (congestive) heart failure (HCC) 01/22/2020   Abdominal pain 01/22/2020   Hypothyroidism 12/13/2019   DM2 (diabetes mellitus, type 2) (HCC) 12/13/2019   Chronic kidney disease, stage 3b (HCC) 12/13/2019   Acute bacterial bronchitis 12/13/2019   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   Typical atrial flutter (HCC)    Chronic respiratory failure with hypoxia (HCC) 05/23/2016   CAD (coronary artery disease) 05/23/2016   Persistent atrial fibrillation (HCC)    Atypical atrial flutter (HCC)    Visit for monitoring Tikosyn therapy 04/16/2016   Chronic diastolic CHF (congestive heart failure) (HCC) 02/03/2016   Cough 12/06/2015   Acute on chronic respiratory failure with hypoxia (HCC) 09/08/2015   Hallucinations 09/04/2015   Lymphadenopathy 09/04/2015   Ascending aortic aneurysm (HCC) 09/04/2015   Hypoxia 09/02/2015   S/P aortic valve replacement with bioprosthetic valve 09/02/2015   Pleural effusion 09/02/2015   S/P AVR 08/22/2015   GERD without esophagitis 09/20/2014   Intrinsic asthma 10/13/2013   Dyspnea 09/15/2013   Pulmonary hypertension (HCC) 09/15/2013   Shortness of breath 05/05/2013   Pacemaker-St.Jude 04/01/2012   Bradycardia 03/31/2012   Second degree AV block 03/31/2012   AV block, 1st degree 02/01/2012   PAF (paroxysmal atrial fibrillation) (HCC) 11/13/2011   Fatigue 11/13/2011   Hypertension 11/13/2011   Aortic stenosis 11/13/2011   Sleep apnea 11/13/2011   Past Medical History:  Diagnosis Date   Aortic stenosis, mild    Arthritis of hand    "just a little bit in both hands" (03/31/2012)   Asthma    "little bit" (03/31/2012)   Atrial fibrillation (HCC)    dx '04; DCCV '04, placed on flecainide, failed DCCV 04/2010, flecainide stopped; s/p successful A.fib ablation 01/31/12   CHF (congestive heart failure) (HCC)    Controlled type 2 diabetes  mellitus without complication, without long-term current use of insulin (HCC) 12/13/2019   COPD (chronic obstructive pulmonary disease) (HCC)    DJD (degenerative joint disease)    Fatty liver    Fibromyalgia    GERD (gastroesophageal reflux disease)    Hyperlipidemia    Hypertension    Hypothyroidism    S/P radiation   Left atrial enlargement    LA size 45mm by echo 11/21/11   Mitral regurgitation    trivial   Obstructive sleep apnea    mild by sleep study 2013; pt stated he does not have a machine because "it wasn't bad enough for him to have one"   Pacemaker 03/31/2012   Pneumonia  last time 2023   Restless leg syndrome    Second degree AV block    Splenomegaly     Family History  Problem Relation Age of Onset   Heart failure Mother    Stroke Father    Neuropathy Neg Hx     Past Surgical History:  Procedure Laterality Date   AMPUTATION Right 08/23/2022   Procedure: RIGHT FOOT FIFTH RAY AMPUTATION;  Surgeon: [name], MD;  Location: [facility] OR;  Service: Orthopedics;  Laterality: Right;   AORTIC VALVE REPLACEMENT N/A 08/22/2015   Procedure: AORTIC VALVE REPLACEMENT (AVR);  Surgeon: Kerin Perna, MD;  Location: [facility] OR;  Service: Open Heart Surgery;  Laterality: N/A;   ATRIAL FIBRILLATION ABLATION  01/30/2012   PVI by Dr. Johney Frame   ATRIAL FIBRILLATION ABLATION N/A 01/31/2012   Procedure: ATRIAL FIBRILLATION ABLATION;  Surgeon: [name], MD;  Location: [facility] CATH LAB;  Service: Cardiovascular;  Laterality: N/A;   ATRIAL FIBRILLATION ABLATION N/A 04/19/2020   Procedure: ATRIAL FIBRILLATION ABLATION;  Surgeon: [name], MD;  Location: [facility] INVASIVE CV LAB;  Service: Cardiovascular;  Laterality: N/A;   BACK SURGERY     X 3   BIOPSY  01/01/2022   Procedure: BIOPSY;  Surgeon: Kathi Der, MD;  Location: [facility] ENDOSCOPY;  Service: Gastroenterology;;   CARDIAC CATHETERIZATION N/A 08/09/2015   Procedure: Right/Left Heart Cath and Coronary Angiography;  Surgeon: Peter M  Swaziland, MD;  Location: [facility] INVASIVE CV LAB;  Service: Cardiovascular;  Laterality: N/A;   CARDIAC CATHETERIZATION N/A 02/05/2016   Procedure: Right/Left Heart Cath and Coronary Angiography;  Surgeon: Corky Crafts, MD;  Location: [facility] INVASIVE CV LAB;  Service: Cardiovascular;  Laterality: N/A;   CARDIAC CATHETERIZATION N/A 04/17/2016   Procedure: Right Heart Cath;  Surgeon: Dolores Patty, MD;  Location: [facility] INVASIVE CV LAB;  Service: Cardiovascular;  Laterality: N/A;   CARDIOVASCULAR STRESS TEST  03/12/2002   EF 48%, NO EVIDENCE OF ISCHEMIA   CARDIOVERSION  01/2012; 03/31/2012   CARDIOVERSION N/A 02/25/2012   Procedure: CARDIOVERSION;  Surgeon: [name], MD;  Location: [facility] CATH LAB;  Service: Cardiovascular;  Laterality: N/A;   CARDIOVERSION N/A 03/31/2012   Procedure: CARDIOVERSION;  Surgeon: [name], MD;  Location: [facility] CATH LAB;  Service: Cardiovascular;  Laterality: N/A;   CARDIOVERSION N/A 11/23/2015   Procedure: CARDIOVERSION;  Surgeon: Laurey Morale, MD;  Location: [facility] ENDOSCOPY;  Service: Cardiovascular;  Laterality: N/A;   CARDIOVERSION N/A 04/08/2016   Procedure: CARDIOVERSION;  Surgeon: Dolores Patty, MD;  Location: [facility] ENDOSCOPY;  Service: Cardiovascular;  Laterality: N/A;   CARDIOVERSION N/A 09/14/2018   Procedure: CARDIOVERSION;  Surgeon: Wendall Stade, MD;  Location: [facility] ENDOSCOPY;  Service: Cardiovascular;  Laterality: N/A;   CARDIOVERSION N/A 10/19/2019   Procedure: CARDIOVERSION;  Surgeon: Dolores Patty, MD;  Location: [facility] ENDOSCOPY;  Service: Cardiovascular;  Laterality: N/A;   CARDIOVERSION N/A 02/25/2020   Procedure: CARDIOVERSION;  Surgeon: Pricilla Riffle, MD;  Location: [facility] ENDOSCOPY;  Service: Cardiovascular;  Laterality: N/A;   CARDIOVERSION N/A 05/25/2020   Procedure: CARDIOVERSION;  Surgeon: Lewayne Bunting, MD;  Location: [facility] ENDOSCOPY;  Service: Cardiovascular;  Laterality: N/A;   CARDIOVERSION N/A 07/10/2020   Procedure: CARDIOVERSION;   Surgeon: Jake Bathe, MD;  Location: [facility] ENDOSCOPY;  Service: Cardiovascular;  Laterality: N/A;   CLIPPING OF ATRIAL APPENDAGE N/A 08/22/2015   Procedure: CLIPPING OF ATRIAL APPENDAGE;  Surgeon: Kerin Perna, MD;  Location: [facility] OR;  Service: Open Heart Surgery;  Laterality: N/A;  COLONOSCOPY WITH PROPOFOL N/A 01/01/2022   Procedure: COLONOSCOPY WITH PROPOFOL;  Surgeon: Kathi Der, MD;  Location: [facility] ENDOSCOPY;  Service: Gastroenterology;  Laterality: N/A;   DOPPLER ECHOCARDIOGRAPHY  03/11/2002   EF 70-75%   FINGER SURGERY Left    Middle finger   FINGER TENDON REPAIR  1980's   "right little finger" (03/31/2012)   FLEXIBLE SIGMOIDOSCOPY N/A 12/30/2021   Procedure: FLEXIBLE SIGMOIDOSCOPY;  Surgeon: Charlott Rakes, MD;  Location: [facility] ENDOSCOPY;  Service: Gastroenterology;  Laterality: N/A;   INSERT / REPLACE / REMOVE PACEMAKER     St Jude   IR RADIOLOGIST EVAL & MGMT  05/06/2019   IR RADIOLOGIST EVAL & MGMT  10/06/2019   IR RADIOLOGIST EVAL & MGMT  10/31/2020   IR RADIOLOGIST EVAL & MGMT  01/18/2021   IR RADIOLOGIST EVAL & MGMT  03/08/2021   IR RADIOLOGIST EVAL & MGMT  06/13/2021   KIDNEY SURGERY Left    LEFT AND RIGHT HEART CATHETERIZATION WITH CORONARY ANGIOGRAM N/A 10/27/2012   Procedure: LEFT AND RIGHT HEART CATHETERIZATION WITH CORONARY ANGIOGRAM;  Surgeon: Pamella Pert, MD;  Location: [facility] CATH LAB;  Service: Cardiovascular;  Laterality: N/A;   LUMBAR DISC SURGERY  1980's X2;  2000's   MAZE N/A 08/22/2015   Procedure: MAZE;  Surgeon: Kerin Perna, MD;  Location[facility] OR;  Service: Open Heart Surgery;  Laterality: N/A;   PACEMAKER INSERTION  03/31/2012   STJ Accent DR pacemaker implanted by Dr Johney Frame   PERMANENT PACEMAKER INSERTION N/A 03/31/2012   Procedure: PERMANENT PACEMAKER INSERTION;  Surgeon: [name], MD;  Location: [facility] CATH LAB;  Service: Cardiovascular;  Laterality: N/A;   PPM GENERATOR CHANGEOUT N/A 08/08/2020   Procedure: PPM GENERATOR CHANGEOUT;  Surgeon:  [name], MD;  Location: [facility] INVASIVE CV LAB;  Service: Cardiovascular;  Laterality: N/A;   RADIOLOGY WITH ANESTHESIA Left 02/14/2021   Procedure: RADIOLOGY WITH ANESTHESIA LEFT RENAL CRYOABLATION;  Surgeon: Irish Lack, MD;  Location: [facility] ORS;  Service: Radiology;  Laterality: Left;   TEE WITHOUT CARDIOVERSION  01/30/2012   Procedure: TRANSESOPHAGEAL ECHOCARDIOGRAM (TEE);  Surgeon: Laurey Morale, MD;  Location: [facility] ENDOSCOPY;  Service: Cardiovascular;  Laterality: N/A;  ablation next day   TEE WITHOUT CARDIOVERSION N/A 08/22/2015   Procedure: TRANSESOPHAGEAL ECHOCARDIOGRAM (TEE);  Surgeon: Kerin Perna, MD;  Location: [facility] OR;  Service: Open Heart Surgery;  Laterality: N/A;   TEE WITHOUT CARDIOVERSION N/A 10/19/2019   Procedure: TRANSESOPHAGEAL ECHOCARDIOGRAM (TEE);  Surgeon: Dolores Patty, MD;  Location: [facility] ENDOSCOPY;  Service: Cardiovascular;  Laterality: N/A;   US ECHOCARDIOGRAPHY  08/07/2009   EF 55-60%   Social History   Occupational History   Occupation: retired  Tobacco Use   Smoking status: Former    Packs/day: 1.00    Years: 25.00    Additional pack years: 0.00    Total pack years: 25.00    Types: Cigarettes    Start date: 1963    Quit date: 07/18/1991    Years since quitting: 31.1    Passive exposure: Never   Smokeless tobacco: Never  Vaping Use   Vaping Use: Never used  Substance and Sexual Activity   Alcohol use: Not Currently    Comment: occasional   Drug use: Yes    Types: Marijuana    Comment: Marijuana- for nausea   Sexual activity: Not Currently

## 2022-09-03 NOTE — Anesthesia Preprocedure Evaluation (Signed)
Anesthesia Evaluation  Patient identified by MRN, date of birth, ID band Patient awake    Reviewed: Allergy & Precautions, H&P , NPO status , Patient's Chart, lab work & pertinent test results  Airway Mallampati: III  TM Distance: >3 FB Neck ROM: Full    Dental  (+) Edentulous Upper   Pulmonary shortness of breath and with exertion, asthma , sleep apnea and Continuous Positive Airway Pressure Ventilation , COPD,  COPD inhaler, former smoker   Pulmonary exam normal breath sounds clear to auscultation       Cardiovascular hypertension (133/67 preop), + CAD  Normal cardiovascular exam+ dysrhythmias (eliquis LD today) Atrial Fibrillation + pacemaker + Valvular Problems/Murmurs (s/p AVR 2017)  Rhythm:Regular Rate:Normal   EKG 08/22/2022: Ventricular paced rhythm.  Rate 62.  Perioperative prescription for implanted cardiac device programming per progress note 09/03/2022: Device Information:  Clinic EP Physician:  Dr. York Pellant   Device Type:  Pacemaker Manufacturer and Phone #:  St. Jude/Abbott: [phone] Pacemaker Dependent?:  Yes Date of Last Device Check:  4/25/24Normal Device Function?:  Yes.    Electrophysiologist's Recommendations:  ? Have magnet available. ? Provide continuous ECG monitoring when magnet is used or reprogramming is to be performed.  ? Procedure should not interfere with device function.  No device programming or magnet placement needed.  TTE 06/10/2021: 1. Left ventricular ejection fraction, by estimation, is 55 to 60%. The  left ventricle has normal function. The left ventricle has no regional  wall motion abnormalities. Left ventricular diastolic parameters are  indeterminate.  2. Right ventricular systolic function is normal. The right ventricular  size is normal. The estimated right ventricular systolic pressure is 32.4  mmHg.  3. The mitral valve is normal in structure.  Trivial mitral valve  regurgitation. No evidence of mitral stenosis.  4. The aortic valve has been repaired/replaced. Aortic valve  regurgitation is not visualized. No aortic stenosis is present. There is a  bioprosthetic valve present in the aortic position. Aortic valve area, by  VTI measures 1.63 cm. Aortic valve Vmax  measures 2.28 m/s. Aortic valve mean gradient measures 11.0 mmHg. Aortic  valve peak gradient measures 20.8 mmHg.  5. There is mild dilatation of the ascending aorta, measuring 38 mm.  6. The inferior vena cava is normal in size with greater than 50%  respiratory variability, suggesting right atrial pressure of 3 mmHg.  7. Compared to echo 12/13/2019, the mean AVG has increased from 8 to  . DVi has dcreased from 0.65 to 0.57 and AVA has decreased from  2.04cm2 to 1.63cm2. The AVA calculation is likely underestimated due to  small LVOT measurement.  8. Left atrial size was mildly dilated.   R/L Cath 02/05/2016: ? Mid RCA lesion, 10 %stenosed. ? Ost Cx to Prox Cx lesion, 20 %stenosed. ? LV end diastolic pressure is moderately elevated. ? Hemodynamic findings consistent with severe pulmonary hypertension. ? No evidence of discordance on the LV/RV tracing. No evidence of pericardial constriction. ? Severly elevated PCWP. ? PA sat 51%. CO 6.2 L/min. Cardiac index 2.87. ? No significant aortic valve gradient. ? There is no aortic valve stenosis.   Discussed with Dr. Elease Hashimoto.  Plan for additional diuresis and possible CHF consult.    Neuro/Psych negative neurological ROS  negative psych ROS   GI/Hepatic Neg liver ROS,GERD  Medicated and Controlled,,  Endo/Other  diabetes, Well Controlled, Type 2, Oral Hypoglycemic Agents    Renal/GU negative Renal ROS  negative genitourinary   Musculoskeletal  (+) Arthritis ,  Osteoarthritis,  Percocet 5/325 QID for leg pain   Abdominal   Peds negative pediatric ROS (+)  Hematology negative hematology ROS (+) Plt  145, hb 13.6   Anesthesia Other Findings   Reproductive/Obstetrics negative OB ROS                             Anesthesia Physical Anesthesia Plan  ASA: 3  Anesthesia Plan: General and Regional   Post-op Pain Management: Regional block* and Tylenol PO (pre-op)*   Induction: Intravenous  PONV Risk Score and Plan: 2 and Ondansetron, Dexamethasone, Midazolam and Treatment may vary due to age or medical condition  Airway Management Planned: LMA  Additional Equipment: None  Intra-op Plan:   Post-operative Plan: Extubation in OR  Informed Consent: I have reviewed the patients History and Physical, chart, labs and discussed the procedure including the risks, benefits and alternatives for the proposed anesthesia with the patient or authorized representative who has indicated his/her understanding and acceptance.     Dental advisory given  Plan Discussed with: CRNA  Anesthesia Plan Comments: (PAT note by Antionette Poles, PA-C:  Follows cardiology for history of permanent atrial fibrillation s/p MAZE procedure 2017 and ablation 2013 and 2021, bioprosthetic AVR 2017, OSA on CPAP, CHB s/p Abbott single-chamber pacemaker.  Last seen by EP cardiologist Dr. Nelly Laurence on 08/22/2022 for preop evaluation prior to undergoing fifth ray amputation.  Per note, "Pt is device-dependent, but the operative site is well away from the device. Out of extreme caution, a magnet may be placed on the device to ensure there will not be inappropriate inhibition."  Non-insulin-dependent DM2, A1c 6.5 on 12/27/2021.  Former smoker with associated moderate COPD and asthma.  Maintained on Breztri Aerosphere, montelukast, and as needed albuterol.  History of CKD followed by nephrology.  BMP and CBC from 08/23/2022 reviewed.  Creatinine elevated 1.91 (baseline appears to be ~1.4-1.8), otherwise unremarkable.     )        Anesthesia Quick Evaluation

## 2022-09-04 ENCOUNTER — Other Ambulatory Visit: Payer: Self-pay

## 2022-09-04 ENCOUNTER — Inpatient Hospital Stay (HOSPITAL_COMMUNITY): Payer: Medicare Other | Admitting: Physician Assistant

## 2022-09-04 ENCOUNTER — Encounter (HOSPITAL_COMMUNITY): Payer: Self-pay | Admitting: Orthopedic Surgery

## 2022-09-04 ENCOUNTER — Encounter (HOSPITAL_COMMUNITY)
Admission: RE | Disposition: A | Discharge status: Skilled Nursing Facility | Payer: Self-pay | Source: Home / Self Care | Attending: Orthopedic Surgery

## 2022-09-04 ENCOUNTER — Inpatient Hospital Stay (HOSPITAL_COMMUNITY)
Admission: RE | Admit: 2022-09-04 | Discharge: 2022-09-10 | DRG: 464 | Disposition: A | Discharge status: Skilled Nursing Facility | Payer: Medicare Other | Attending: Orthopedic Surgery | Admitting: Orthopedic Surgery

## 2022-09-04 DIAGNOSIS — E785 Hyperlipidemia, unspecified: Secondary | ICD-10-CM | POA: Diagnosis present

## 2022-09-04 DIAGNOSIS — Z923 Personal history of irradiation: Secondary | ICD-10-CM

## 2022-09-04 DIAGNOSIS — I110 Hypertensive heart disease with heart failure: Secondary | ICD-10-CM | POA: Diagnosis present

## 2022-09-04 DIAGNOSIS — Z8701 Personal history of pneumonia (recurrent): Secondary | ICD-10-CM | POA: Diagnosis not present

## 2022-09-04 DIAGNOSIS — E1152 Type 2 diabetes mellitus with diabetic peripheral angiopathy with gangrene: Secondary | ICD-10-CM | POA: Diagnosis present

## 2022-09-04 DIAGNOSIS — I96 Gangrene, not elsewhere classified: Secondary | ICD-10-CM

## 2022-09-04 DIAGNOSIS — M19042 Primary osteoarthritis, left hand: Secondary | ICD-10-CM | POA: Diagnosis present

## 2022-09-04 DIAGNOSIS — Z823 Family history of stroke: Secondary | ICD-10-CM

## 2022-09-04 DIAGNOSIS — Z952 Presence of prosthetic heart valve: Secondary | ICD-10-CM | POA: Diagnosis not present

## 2022-09-04 DIAGNOSIS — J4489 Other specified chronic obstructive pulmonary disease: Secondary | ICD-10-CM | POA: Diagnosis present

## 2022-09-04 DIAGNOSIS — G4733 Obstructive sleep apnea (adult) (pediatric): Secondary | ICD-10-CM | POA: Diagnosis present

## 2022-09-04 DIAGNOSIS — Y835 Amputation of limb(s) as the cause of abnormal reaction of the patient, or of later complication, without mention of misadventure at the time of the procedure: Secondary | ICD-10-CM | POA: Diagnosis present

## 2022-09-04 DIAGNOSIS — Z7984 Long term (current) use of oral hypoglycemic drugs: Secondary | ICD-10-CM | POA: Diagnosis not present

## 2022-09-04 DIAGNOSIS — Z7901 Long term (current) use of anticoagulants: Secondary | ICD-10-CM

## 2022-09-04 DIAGNOSIS — K219 Gastro-esophageal reflux disease without esophagitis: Secondary | ICD-10-CM | POA: Diagnosis present

## 2022-09-04 DIAGNOSIS — I5032 Chronic diastolic (congestive) heart failure: Secondary | ICD-10-CM | POA: Diagnosis present

## 2022-09-04 DIAGNOSIS — I10 Essential (primary) hypertension: Secondary | ICD-10-CM

## 2022-09-04 DIAGNOSIS — Z886 Allergy status to analgesic agent status: Secondary | ICD-10-CM | POA: Diagnosis not present

## 2022-09-04 DIAGNOSIS — I2510 Atherosclerotic heart disease of native coronary artery without angina pectoris: Secondary | ICD-10-CM | POA: Diagnosis not present

## 2022-09-04 DIAGNOSIS — E1169 Type 2 diabetes mellitus with other specified complication: Secondary | ICD-10-CM | POA: Diagnosis present

## 2022-09-04 DIAGNOSIS — Z950 Presence of cardiac pacemaker: Secondary | ICD-10-CM | POA: Diagnosis not present

## 2022-09-04 DIAGNOSIS — Z87891 Personal history of nicotine dependence: Secondary | ICD-10-CM

## 2022-09-04 DIAGNOSIS — Z881 Allergy status to other antibiotic agents status: Secondary | ICD-10-CM

## 2022-09-04 DIAGNOSIS — M797 Fibromyalgia: Secondary | ICD-10-CM | POA: Diagnosis present

## 2022-09-04 DIAGNOSIS — Z89511 Acquired absence of right leg below knee: Secondary | ICD-10-CM

## 2022-09-04 DIAGNOSIS — L97519 Non-pressure chronic ulcer of other part of right foot with unspecified severity: Secondary | ICD-10-CM | POA: Diagnosis present

## 2022-09-04 DIAGNOSIS — M86171 Other acute osteomyelitis, right ankle and foot: Secondary | ICD-10-CM | POA: Diagnosis present

## 2022-09-04 DIAGNOSIS — T8789 Other complications of amputation stump: Principal | ICD-10-CM | POA: Diagnosis present

## 2022-09-04 DIAGNOSIS — Z01818 Encounter for other preprocedural examination: Principal | ICD-10-CM

## 2022-09-04 DIAGNOSIS — K760 Fatty (change of) liver, not elsewhere classified: Secondary | ICD-10-CM | POA: Diagnosis present

## 2022-09-04 DIAGNOSIS — Z8249 Family history of ischemic heart disease and other diseases of the circulatory system: Secondary | ICD-10-CM

## 2022-09-04 DIAGNOSIS — E039 Hypothyroidism, unspecified: Secondary | ICD-10-CM | POA: Diagnosis present

## 2022-09-04 DIAGNOSIS — I4891 Unspecified atrial fibrillation: Secondary | ICD-10-CM | POA: Diagnosis present

## 2022-09-04 DIAGNOSIS — G2581 Restless legs syndrome: Secondary | ICD-10-CM | POA: Diagnosis present

## 2022-09-04 DIAGNOSIS — M19041 Primary osteoarthritis, right hand: Secondary | ICD-10-CM | POA: Diagnosis present

## 2022-09-04 HISTORY — PX: AMPUTATION: SHX166

## 2022-09-04 LAB — CBC WITH DIFFERENTIAL/PLATELET
Abs Immature Granulocytes: 0.08 10*3/uL — ABNORMAL HIGH (ref 0.00–0.07)
Basophils Absolute: 0.1 10*3/uL (ref 0.0–0.1)
Basophils Relative: 1 %
Eosinophils Absolute: 0.1 10*3/uL (ref 0.0–0.5)
Eosinophils Relative: 1 %
HCT: 42.4 % (ref 39.0–52.0)
Hemoglobin: 13.6 g/dL (ref 13.0–17.0)
Immature Granulocytes: 1 %
Lymphocytes Relative: 11 %
Lymphs Abs: 0.9 10*3/uL (ref 0.7–4.0)
MCH: 30.7 pg (ref 26.0–34.0)
MCHC: 32.1 g/dL (ref 30.0–36.0)
MCV: 95.7 fL (ref 80.0–100.0)
Monocytes Absolute: 1.3 10*3/uL — ABNORMAL HIGH (ref 0.1–1.0)
Monocytes Relative: 15 %
Neutro Abs: 5.9 10*3/uL (ref 1.7–7.7)
Neutrophils Relative %: 71 %
Platelets: 145 10*3/uL — ABNORMAL LOW (ref 150–400)
RBC: 4.43 MIL/uL (ref 4.22–5.81)
RDW: 15.5 % (ref 11.5–15.5)
WBC: 8.3 10*3/uL (ref 4.0–10.5)
nRBC: 0 % (ref 0.0–0.2)

## 2022-09-04 LAB — GLUCOSE, CAPILLARY
Glucose-Capillary: 122 mg/dL — ABNORMAL HIGH (ref 70–99)
Glucose-Capillary: 117 mg/dL — ABNORMAL HIGH (ref 70–99)
Glucose-Capillary: 111 mg/dL — ABNORMAL HIGH (ref 70–99)
Glucose-Capillary: 110 mg/dL — ABNORMAL HIGH (ref 70–99)

## 2022-09-04 LAB — COMPREHENSIVE METABOLIC PANEL
ALT: 26 U/L (ref 0–44)
AST: 33 U/L (ref 15–41)
Albumin: 3.6 g/dL (ref 3.5–5.0)
Alkaline Phosphatase: 83 U/L (ref 38–126)
Anion gap: 10 (ref 5–15)
BUN: 36 mg/dL — ABNORMAL HIGH (ref 8–23)
CO2: 26 mmol/L (ref 22–32)
Calcium: 8.9 mg/dL (ref 8.9–10.3)
Chloride: 98 mmol/L (ref 98–111)
Creatinine, Ser: 1.64 mg/dL — ABNORMAL HIGH (ref 0.61–1.24)
GFR, Estimated: 44 mL/min — ABNORMAL LOW (ref >60)
Glucose, Bld: 128 mg/dL — ABNORMAL HIGH (ref 70–99)
Potassium: 4.1 mmol/L (ref 3.5–5.1)
Sodium: 134 mmol/L — ABNORMAL LOW (ref 135–145)
Total Bilirubin: 0.5 mg/dL (ref 0.3–1.2)
Total Protein: 7.4 g/dL (ref 6.5–8.1)

## 2022-09-04 LAB — PREALBUMIN: Prealbumin: 25 mg/dL (ref 18–38)

## 2022-09-04 SURGERY — AMPUTATION BELOW KNEE
Anesthesia: Regional | Site: Knee | Laterality: Right

## 2022-09-04 MED ORDER — POLYETHYLENE GLYCOL 3350 17 G PO PACK
17.0000 g | PACK | Freq: Every day | ORAL | Status: DC | PRN
  Administered 2022-09-10: 17 g via ORAL
  Filled 2022-09-04: qty 1

## 2022-09-04 MED ORDER — BUPIVACAINE LIPOSOME 1.3 % IJ SUSP
INTRAMUSCULAR | Status: DC | PRN
  Administered 2022-09-04: 10 mL via PERINEURAL

## 2022-09-04 MED ORDER — GABAPENTIN 300 MG PO CAPS: 600.0000 mg | ORAL_CAPSULE | ORAL | Status: DC

## 2022-09-04 MED ORDER — UMECLIDINIUM BROMIDE 62.5 MCG/ACT IN AEPB
1.0000 | INHALATION_SPRAY | Freq: Every day | RESPIRATORY_TRACT | Status: DC
  Administered 2022-09-04 – 2022-09-10 (×7): 1 via RESPIRATORY_TRACT
  Filled 2022-09-04: qty 7

## 2022-09-04 MED ORDER — TRANEXAMIC ACID-NACL 1000-0.7 MG/100ML-% IV SOLN
1000.0000 mg | INTRAVENOUS | Status: AC
  Administered 2022-09-04: 1000 mg via INTRAVENOUS
  Filled 2022-09-04: qty 100

## 2022-09-04 MED ORDER — INSULIN ASPART 100 UNIT/ML IJ SOLN: 0.0000 [IU] | INTRAMUSCULAR | Status: DC | PRN

## 2022-09-04 MED ORDER — ORAL CARE MOUTH RINSE: 15.0000 mL | Freq: Once | OROMUCOSAL | Status: AC

## 2022-09-04 MED ORDER — HYDROMORPHONE HCL 1 MG/ML IJ SOLN: 0.2500 mg | INTRAMUSCULAR | Status: DC | PRN

## 2022-09-04 MED ORDER — CEFAZOLIN SODIUM-DEXTROSE 2-4 GM/100ML-% IV SOLN
2.0000 g | INTRAVENOUS | Status: AC
  Administered 2022-09-04: 2 g via INTRAVENOUS
  Filled 2022-09-04: qty 100

## 2022-09-04 MED ORDER — LABETALOL HCL 5 MG/ML IV SOLN: 10.0000 mg | INTRAVENOUS | Status: DC | PRN

## 2022-09-04 MED ORDER — 0.9 % SODIUM CHLORIDE (POUR BTL) OPTIME
TOPICAL | Status: DC | PRN
  Administered 2022-09-04: 1000 mL

## 2022-09-04 MED ORDER — TRANEXAMIC ACID 1000 MG/10ML IV SOLN
2000.0000 mg | INTRAVENOUS | Status: DC
  Filled 2022-09-04: qty 20

## 2022-09-04 MED ORDER — ONDANSETRON HCL 4 MG/2ML IJ SOLN: 4.0000 mg | Freq: Four times a day (QID) | INTRAMUSCULAR | Status: DC | PRN

## 2022-09-04 MED ORDER — ONDANSETRON HCL 4 MG/2ML IJ SOLN: 4.0000 mg | Freq: Once | INTRAMUSCULAR | Status: DC | PRN

## 2022-09-04 MED ORDER — VITAMIN C 500 MG PO TABS
1000.0000 mg | ORAL_TABLET | Freq: Every day | ORAL | Status: DC
  Administered 2022-09-04 – 2022-09-10 (×7): 1000 mg via ORAL
  Filled 2022-09-04 (×7): qty 2

## 2022-09-04 MED ORDER — ACETAMINOPHEN 325 MG PO TABS
325.0000 mg | ORAL_TABLET | Freq: Four times a day (QID) | ORAL | Status: DC | PRN
  Administered 2022-09-05 – 2022-09-10 (×3): 650 mg via ORAL
  Filled 2022-09-04 (×3): qty 2

## 2022-09-04 MED ORDER — ONDANSETRON HCL 4 MG/2ML IJ SOLN
INTRAMUSCULAR | Status: DC | PRN
  Administered 2022-09-04: 4 mg via INTRAVENOUS

## 2022-09-04 MED ORDER — PROPOFOL 10 MG/ML IV BOLUS
INTRAVENOUS | Status: AC
  Filled 2022-09-04: qty 20

## 2022-09-04 MED ORDER — GUAIFENESIN-DM 100-10 MG/5ML PO SYRP: 15.0000 mL | ORAL_SOLUTION | ORAL | Status: DC | PRN

## 2022-09-04 MED ORDER — OXYCODONE HCL 5 MG PO TABS
10.0000 mg | ORAL_TABLET | ORAL | Status: DC | PRN
  Administered 2022-09-04 – 2022-09-07 (×11): 15 mg via ORAL
  Administered 2022-09-08: 10 mg via ORAL
  Administered 2022-09-08: 15 mg via ORAL
  Administered 2022-09-08: 10 mg via ORAL
  Administered 2022-09-08 – 2022-09-10 (×7): 15 mg via ORAL
  Filled 2022-09-04: qty 3
  Filled 2022-09-04: qty 2
  Filled 2022-09-04 (×16): qty 3
  Filled 2022-09-04: qty 2
  Filled 2022-09-04 (×3): qty 3

## 2022-09-04 MED ORDER — GABAPENTIN 400 MG PO CAPS
1200.0000 mg | ORAL_CAPSULE | Freq: Every day | ORAL | Status: DC
  Administered 2022-09-04 – 2022-09-09 (×6): 1200 mg via ORAL
  Filled 2022-09-04 (×6): qty 3

## 2022-09-04 MED ORDER — CHLORHEXIDINE GLUCONATE 0.12 % MT SOLN
15.0000 mL | Freq: Once | OROMUCOSAL | Status: AC
  Administered 2022-09-04: 15 mL via OROMUCOSAL
  Filled 2022-09-04: qty 15

## 2022-09-04 MED ORDER — LEVOTHYROXINE SODIUM 75 MCG PO TABS
175.0000 ug | ORAL_TABLET | Freq: Every day | ORAL | Status: DC
  Administered 2022-09-05 – 2022-09-10 (×6): 175 ug via ORAL
  Filled 2022-09-04 (×6): qty 1

## 2022-09-04 MED ORDER — METOPROLOL TARTRATE 5 MG/5ML IV SOLN: 2.0000 mg | INTRAVENOUS | Status: DC | PRN

## 2022-09-04 MED ORDER — HYDRALAZINE HCL 20 MG/ML IJ SOLN: 5.0000 mg | INTRAMUSCULAR | Status: DC | PRN

## 2022-09-04 MED ORDER — BISOPROLOL FUMARATE 5 MG PO TABS
5.0000 mg | ORAL_TABLET | Freq: Every day | ORAL | Status: DC
  Administered 2022-09-05 – 2022-09-10 (×6): 5 mg via ORAL
  Filled 2022-09-04 (×6): qty 1

## 2022-09-04 MED ORDER — PHENYLEPHRINE 80 MCG/ML (10ML) SYRINGE FOR IV PUSH (FOR BLOOD PRESSURE SUPPORT)
PREFILLED_SYRINGE | INTRAVENOUS | Status: AC
  Filled 2022-09-04: qty 10

## 2022-09-04 MED ORDER — CEFAZOLIN SODIUM-DEXTROSE 2-4 GM/100ML-% IV SOLN
2.0000 g | Freq: Three times a day (TID) | INTRAVENOUS | Status: AC
  Administered 2022-09-04 – 2022-09-05 (×2): 2 g via INTRAVENOUS
  Filled 2022-09-04: qty 100

## 2022-09-04 MED ORDER — PANTOPRAZOLE SODIUM 40 MG PO TBEC
40.0000 mg | DELAYED_RELEASE_TABLET | Freq: Every day | ORAL | Status: DC
Start: 2022-09-04 — End: 2022-09-04

## 2022-09-04 MED ORDER — ALBUTEROL SULFATE (2.5 MG/3ML) 0.083% IN NEBU: 3.0000 mL | INHALATION_SOLUTION | Freq: Four times a day (QID) | RESPIRATORY_TRACT | Status: DC | PRN

## 2022-09-04 MED ORDER — FLUTICASONE FUROATE-VILANTEROL 100-25 MCG/ACT IN AEPB
1.0000 | INHALATION_SPRAY | Freq: Every day | RESPIRATORY_TRACT | Status: DC
  Administered 2022-09-04 – 2022-09-10 (×7): 1 via RESPIRATORY_TRACT
  Filled 2022-09-04: qty 28

## 2022-09-04 MED ORDER — AMLODIPINE BESYLATE 5 MG PO TABS
5.0000 mg | ORAL_TABLET | Freq: Every morning | ORAL | Status: DC
  Administered 2022-09-05 – 2022-09-10 (×6): 5 mg via ORAL
  Filled 2022-09-04 (×6): qty 1

## 2022-09-04 MED ORDER — SPIRONOLACTONE 25 MG PO TABS
25.0000 mg | ORAL_TABLET | Freq: Every day | ORAL | Status: DC
  Administered 2022-09-05 – 2022-09-10 (×6): 25 mg via ORAL
  Filled 2022-09-04 (×6): qty 1

## 2022-09-04 MED ORDER — LACTATED RINGERS IV SOLN: INTRAVENOUS | Status: DC

## 2022-09-04 MED ORDER — MIDAZOLAM HCL 2 MG/2ML IJ SOLN: 1.0000 mg | Freq: Once | INTRAMUSCULAR | Status: AC

## 2022-09-04 MED ORDER — PROPOFOL 10 MG/ML IV BOLUS
INTRAVENOUS | Status: DC | PRN
  Administered 2022-09-04: 130 mg via INTRAVENOUS

## 2022-09-04 MED ORDER — MONTELUKAST SODIUM 10 MG PO TABS
10.0000 mg | ORAL_TABLET | Freq: Every day | ORAL | Status: DC
  Administered 2022-09-04 – 2022-09-09 (×6): 10 mg via ORAL
  Filled 2022-09-04 (×6): qty 1

## 2022-09-04 MED ORDER — OXYCODONE HCL 5 MG PO TABS
5.0000 mg | ORAL_TABLET | ORAL | Status: DC | PRN
  Administered 2022-09-07: 5 mg via ORAL
  Filled 2022-09-04: qty 2

## 2022-09-04 MED ORDER — PHENOL 1.4 % MT LIQD: 1.0000 | OROMUCOSAL | Status: DC | PRN

## 2022-09-04 MED ORDER — PANTOPRAZOLE SODIUM 40 MG PO TBEC
40.0000 mg | DELAYED_RELEASE_TABLET | Freq: Every day | ORAL | Status: DC
  Administered 2022-09-05 – 2022-09-10 (×6): 40 mg via ORAL
  Filled 2022-09-04 (×6): qty 1

## 2022-09-04 MED ORDER — MIDAZOLAM HCL 2 MG/2ML IJ SOLN
INTRAMUSCULAR | Status: AC
  Administered 2022-09-04: 1 mg via INTRAVENOUS
  Filled 2022-09-04: qty 2

## 2022-09-04 MED ORDER — GABAPENTIN 300 MG PO CAPS
600.0000 mg | ORAL_CAPSULE | Freq: Every day | ORAL | Status: DC
  Administered 2022-09-05 – 2022-09-10 (×6): 600 mg via ORAL
  Filled 2022-09-04 (×6): qty 2

## 2022-09-04 MED ORDER — OXYCODONE HCL 5 MG/5ML PO SOLN: 5.0000 mg | Freq: Once | ORAL | Status: DC | PRN

## 2022-09-04 MED ORDER — ACETAMINOPHEN 500 MG PO TABS
1000.0000 mg | ORAL_TABLET | Freq: Once | ORAL | Status: AC
  Administered 2022-09-04: 1000 mg via ORAL
  Filled 2022-09-04: qty 2

## 2022-09-04 MED ORDER — BUDESON-GLYCOPYRROL-FORMOTEROL 160-9-4.8 MCG/ACT IN AERO: 2.0000 | INHALATION_SPRAY | Freq: Two times a day (BID) | RESPIRATORY_TRACT | Status: DC

## 2022-09-04 MED ORDER — PREDNISONE 5 MG PO TABS
2.5000 mg | ORAL_TABLET | Freq: Every day | ORAL | Status: DC
  Administered 2022-09-05 – 2022-09-10 (×6): 2.5 mg via ORAL
  Filled 2022-09-04 (×6): qty 1

## 2022-09-04 MED ORDER — POTASSIUM CHLORIDE CRYS ER 20 MEQ PO TBCR: 20.0000 meq | EXTENDED_RELEASE_TABLET | Freq: Every day | ORAL | Status: DC | PRN

## 2022-09-04 MED ORDER — GABAPENTIN 300 MG PO CAPS
600.0000 mg | ORAL_CAPSULE | Freq: Every day | ORAL | Status: DC | PRN
  Administered 2022-09-04: 600 mg via ORAL
  Filled 2022-09-04: qty 2

## 2022-09-04 MED ORDER — FENTANYL CITRATE (PF) 100 MCG/2ML IJ SOLN: 50.0000 ug | Freq: Once | INTRAMUSCULAR | Status: AC

## 2022-09-04 MED ORDER — FENTANYL CITRATE (PF) 100 MCG/2ML IJ SOLN
INTRAMUSCULAR | Status: AC
  Administered 2022-09-04: 50 ug via INTRAVENOUS
  Filled 2022-09-04: qty 2

## 2022-09-04 MED ORDER — ALUM & MAG HYDROXIDE-SIMETH 200-200-20 MG/5ML PO SUSP: 15.0000 mL | ORAL | Status: DC | PRN

## 2022-09-04 MED ORDER — LIDOCAINE 2% (20 MG/ML) 5 ML SYRINGE
INTRAMUSCULAR | Status: DC | PRN
  Administered 2022-09-04: 40 mg via INTRAVENOUS

## 2022-09-04 MED ORDER — TORSEMIDE 20 MG PO TABS
30.0000 mg | ORAL_TABLET | ORAL | Status: DC
  Administered 2022-09-05 – 2022-09-09 (×3): 30 mg via ORAL
  Filled 2022-09-04 (×3): qty 2

## 2022-09-04 MED ORDER — BISACODYL 5 MG PO TBEC
5.0000 mg | DELAYED_RELEASE_TABLET | Freq: Every day | ORAL | Status: DC | PRN
  Administered 2022-09-10: 5 mg via ORAL
  Filled 2022-09-04: qty 1

## 2022-09-04 MED ORDER — ONDANSETRON HCL 4 MG/2ML IJ SOLN
INTRAMUSCULAR | Status: AC
  Filled 2022-09-04: qty 2

## 2022-09-04 MED ORDER — DOCUSATE SODIUM 100 MG PO CAPS
100.0000 mg | ORAL_CAPSULE | Freq: Every day | ORAL | Status: DC
  Administered 2022-09-05 – 2022-09-10 (×6): 100 mg via ORAL
  Filled 2022-09-04 (×6): qty 1

## 2022-09-04 MED ORDER — OXYCODONE HCL 5 MG PO TABS: 5.0000 mg | ORAL_TABLET | Freq: Once | ORAL | Status: DC | PRN

## 2022-09-04 MED ORDER — HYDROMORPHONE HCL 1 MG/ML IJ SOLN
INTRAMUSCULAR | Status: AC
  Filled 2022-09-04: qty 0.5

## 2022-09-04 MED ORDER — EMPAGLIFLOZIN 10 MG PO TABS
10.0000 mg | ORAL_TABLET | Freq: Every day | ORAL | Status: DC
  Administered 2022-09-05 – 2022-09-10 (×6): 10 mg via ORAL
  Filled 2022-09-04 (×6): qty 1

## 2022-09-04 MED ORDER — MAGNESIUM SULFATE 2 GM/50ML IV SOLN: 2.0000 g | Freq: Every day | INTRAVENOUS | Status: DC | PRN

## 2022-09-04 MED ORDER — ZINC SULFATE 220 (50 ZN) MG PO CAPS
220.0000 mg | ORAL_CAPSULE | Freq: Every day | ORAL | Status: DC
  Administered 2022-09-04 – 2022-09-10 (×7): 220 mg via ORAL
  Filled 2022-09-04 (×7): qty 1

## 2022-09-04 MED ORDER — JUVEN PO PACK
1.0000 | PACK | Freq: Two times a day (BID) | ORAL | Status: DC
  Administered 2022-09-04 – 2022-09-10 (×12): 1 via ORAL
  Filled 2022-09-04 (×12): qty 1

## 2022-09-04 MED ORDER — ROSUVASTATIN CALCIUM 5 MG PO TABS
10.0000 mg | ORAL_TABLET | Freq: Every day | ORAL | Status: DC
  Administered 2022-09-04 – 2022-09-09 (×6): 10 mg via ORAL
  Filled 2022-09-04 (×6): qty 2

## 2022-09-04 MED ORDER — PHENYLEPHRINE 80 MCG/ML (10ML) SYRINGE FOR IV PUSH (FOR BLOOD PRESSURE SUPPORT)
PREFILLED_SYRINGE | INTRAVENOUS | Status: DC | PRN
  Administered 2022-09-04 (×3): 160 ug via INTRAVENOUS

## 2022-09-04 MED ORDER — SODIUM CHLORIDE 0.9 % IV SOLN
INTRAVENOUS | Status: DC
  Administered 2022-09-04: 75 mL/h via INTRAVENOUS

## 2022-09-04 MED ORDER — HYDROMORPHONE HCL 1 MG/ML IJ SOLN
0.5000 mg | INTRAMUSCULAR | Status: DC | PRN
  Administered 2022-09-04 – 2022-09-07 (×11): 1 mg via INTRAVENOUS
  Administered 2022-09-08: 0.5 mg via INTRAVENOUS
  Administered 2022-09-08 – 2022-09-09 (×3): 1 mg via INTRAVENOUS
  Filled 2022-09-04 (×15): qty 1

## 2022-09-04 MED ORDER — TRAZODONE HCL 50 MG PO TABS
50.0000 mg | ORAL_TABLET | Freq: Every day | ORAL | Status: DC
  Administered 2022-09-04 – 2022-09-09 (×6): 50 mg via ORAL
  Filled 2022-09-04 (×6): qty 1

## 2022-09-04 MED ORDER — ROPINIROLE HCL 0.5 MG PO TABS
1.0000 mg | ORAL_TABLET | Freq: Three times a day (TID) | ORAL | Status: DC
  Administered 2022-09-04 – 2022-09-10 (×18): 1 mg via ORAL
  Filled 2022-09-04 (×18): qty 2

## 2022-09-04 MED ORDER — MAGNESIUM CITRATE PO SOLN: 1.0000 | Freq: Once | ORAL | Status: DC | PRN

## 2022-09-04 MED ORDER — APIXABAN 5 MG PO TABS
5.0000 mg | ORAL_TABLET | Freq: Two times a day (BID) | ORAL | Status: DC
  Administered 2022-09-04 – 2022-09-10 (×12): 5 mg via ORAL
  Filled 2022-09-04 (×12): qty 1

## 2022-09-04 MED ORDER — ROPIVACAINE HCL 5 MG/ML IJ SOLN
INTRAMUSCULAR | Status: DC | PRN
  Administered 2022-09-04: 30 mL via PERINEURAL

## 2022-09-04 SURGICAL SUPPLY — 44 items
BAG COUNTER SPONGE SURGICOUNT (BAG) IMPLANT
BAG SPNG CNTER NS LX DISP (BAG)
BIT DRILL 3.2XOCPTL (BIT) ×1 IMPLANT
BIT DRL 3.2XOCPTL (BIT) ×1
BLADE SAW RECIP 87.9 MT (BLADE) ×1 IMPLANT
BLADE SURG 21 STRL SS (BLADE) ×1 IMPLANT
BNDG CMPR 5X6 CHSV STRCH STRL (GAUZE/BANDAGES/DRESSINGS) ×1
BNDG COHESIVE 6X5 TAN NS LF (GAUZE/BANDAGES/DRESSINGS) IMPLANT
BNDG COHESIVE 6X5 TAN ST LF (GAUZE/BANDAGES/DRESSINGS) IMPLANT
CANISTER WOUND CARE 500ML ATS (WOUND CARE) ×1 IMPLANT
COVER SURGICAL LIGHT HANDLE (MISCELLANEOUS) ×1 IMPLANT
CUFF TOURN SGL QUICK 34 (TOURNIQUET CUFF) ×1
CUFF TRNQT CYL 34X4.125X (TOURNIQUET CUFF) ×1 IMPLANT
DRAPE INCISE IOBAN 66X45 STRL (DRAPES) ×1 IMPLANT
DRAPE U-SHAPE 47X51 STRL (DRAPES) ×1 IMPLANT
DRESSING PREVENA PLUS CUSTOM (GAUZE/BANDAGES/DRESSINGS) ×1 IMPLANT
DRILL BIT (BIT) ×1
DRSG PREVENA PLUS CUSTOM (GAUZE/BANDAGES/DRESSINGS) ×1
DURAPREP 26ML APPLICATOR (WOUND CARE) ×1 IMPLANT
ELECT REM PT RETURN 9FT ADLT (ELECTROSURGICAL) ×1
ELECTRODE REM PT RTRN 9FT ADLT (ELECTROSURGICAL) ×1 IMPLANT
GLOVE BIOGEL PI IND STRL 9 (GLOVE) ×1 IMPLANT
GLOVE SURG ORTHO 9.0 STRL STRW (GLOVE) ×1 IMPLANT
GOWN STRL REUS W/ TWL XL LVL3 (GOWN DISPOSABLE) ×2 IMPLANT
GOWN STRL REUS W/TWL XL LVL3 (GOWN DISPOSABLE) ×2
GRAFT SKIN WND MICRO 38 (Tissue) IMPLANT
GRAFT SKIN WND OMEGA3 SB 7X10 (Tissue) IMPLANT
KIT BASIN OR (CUSTOM PROCEDURE TRAY) ×1 IMPLANT
KIT TURNOVER KIT B (KITS) ×1 IMPLANT
MANIFOLD NEPTUNE II (INSTRUMENTS) ×1 IMPLANT
NS IRRIG 1000ML POUR BTL (IV SOLUTION) ×1 IMPLANT
PACK ORTHO EXTREMITY (CUSTOM PROCEDURE TRAY) ×1 IMPLANT
PAD ARMBOARD 7.5X6 YLW CONV (MISCELLANEOUS) ×1 IMPLANT
PREVENA RESTOR ARTHOFORM 46X30 (CANNISTER) ×1 IMPLANT
SPONGE T-LAP 18X18 ~~LOC~~+RFID (SPONGE) IMPLANT
STAPLER VISISTAT 35W (STAPLE) IMPLANT
STOCKINETTE IMPERVIOUS LG (DRAPES) ×1 IMPLANT
SUT ETHILON 2 0 PSLX (SUTURE) IMPLANT
SUT SILK 2 0 (SUTURE) ×1
SUT SILK 2-0 18XBRD TIE 12 (SUTURE) ×1 IMPLANT
SUT VIC AB 1 CTX 27 (SUTURE) ×2 IMPLANT
TOWEL GREEN STERILE (TOWEL DISPOSABLE) ×1 IMPLANT
TUBE CONNECTING 12X1/4 (SUCTIONS) ×1 IMPLANT
YANKAUER SUCT BULB TIP NO VENT (SUCTIONS) ×1 IMPLANT

## 2022-09-04 NOTE — Evaluation (Signed)
Occupational Therapy Evaluation Patient Details Name: [name] MRN: [id number] DOB: 01-Apr-1951 Today's Date: 09/04/2022   History of Present Illness 72 yo male s/p R BKA 09/04/22 PMH atrial fibrillation COPD, CHF Fibromyalgia HTN, HLD, pacemaker, Aortic valve replacement back surgery x3 remote smoker   Clinical Impression   Patient is s/p R BKA surgery resulting in functional limitations due to the deficits listed below (see OT problem list). Pt with recent surgery and d/c home with crutches. Pt and wife report inability to use the crutches and pt fell resulting in the need for R BKA. Pt and wife have been using a rolling computer chair in the home due to pt's inability to transfer safely. Pt and wife requesting DME that helps maximize pt's indep and safety. OT recommending elevated leg rest on w/c.  Patient will benefit from skilled OT acutely to increase independence and safety with ADLS to allow discharge home health .       Recommendations for follow up therapy are one component of a multi-disciplinary discharge planning process, led by the attending physician.  Recommendations may be updated based on patient status, additional functional criteria and insurance authorization.   Assistance Recommended at Discharge Intermittent Supervision/Assistance  Patient can return home with the following A little help with walking and/or transfers;A little help with bathing/dressing/bathroom;Assist for transportation    Functional Status Assessment  Patient has had a recent decline in their functional status and demonstrates the ability to make significant improvements in function in a reasonable and predictable amount of time.  Equipment Recommendations  BSC/3in1;Wheelchair (measurements OT);Wheelchair cushion (measurements OT);Other (comment) (RW, RN for wound vac)    Recommendations for Other Services       Precautions / Restrictions Precautions Precautions: Fall Precaution Comments:  residual limb protector, wound vac      Mobility Bed Mobility Overal bed mobility: Independent                  Transfers Overall transfer level: Needs assistance Equipment used: Rolling walker (2 wheels) Transfers: Sit to/from Stand Sit to Stand: Min guard           General transfer comment: elevated surface with RW to complete static standing. pt educated on posture corrections of R LE knee extension hip neutral alignment and head up posture. pt static standing in great alignment with cues.      Balance Overall balance assessment: Mild deficits observed, not formally tested                                         ADL either performed or assessed with clinical judgement   ADL Overall ADL's : Needs assistance/impaired Eating/Feeding: Independent   Grooming: Wash/dry hands;Wash/dry face;Oral care;Modified independent;Sitting   Upper Body Bathing: Modified independent;Sitting   Lower Body Bathing: Moderate assistance;Sit to/from stand                         General ADL Comments: pt and wife educated on residual limb protector don doff correct positioning. Educated on elevation with knee extension, don doff shrinke sock and management of the wound vac line by helping make sure not too much pressure on skin under the shrinker sock.     Vision Baseline Vision/History: 1 Wears glasses Ability to See in Adequate Light: 0 Adequate Additional Comments: no glasses present but wear them all the time  Perception     Praxis      Pertinent Vitals/Pain Pain Assessment Pain Assessment: 0-10 Pain Score: 4  Pain Location: leg Pain Descriptors / Indicators: Burning Pain Intervention(s): Monitored during session, Premedicated before session, Repositioned     Hand Dominance Right   Extremity/Trunk Assessment Upper Extremity Assessment Upper Extremity Assessment: Overall WFL for tasks assessed   Lower Extremity Assessment Lower  Extremity Assessment: RLE deficits/detail RLE Deficits / Details: BKA tolerating residual limb protector well with black shrinker sock   Cervical / Trunk Assessment Cervical / Trunk Assessment: Normal   Communication Communication Communication: No difficulties   Cognition Arousal/Alertness: Awake/alert Behavior During Therapy: WFL for tasks assessed/performed Overall Cognitive Status: Within Functional Limits for tasks assessed                                       General Comments  dressing intact and wound vac working at this time. wife present for all education    Exercises     Shoulder Instructions      Home Living Family/patient expects to be discharged to:: Private residence Living Arrangements: Spouse/significant other Available Help at Discharge: Available 24 hours/day Type of Home: House Home Access: Ramped entrance     Home Layout: One level     Bathroom Shower/Tub: Producer, television/film/video: Standard     Home Equipment: None   Additional Comments: 2 german shephards and mini schnauzer      Prior Functioning/Environment Prior Level of Function : Needs assist             Mobility Comments: sitting in computer chair and wife pushing from room to room ADLs Comments: wife helping with bathing and dressing        OT Problem List: Decreased strength;Decreased activity tolerance;Impaired balance (sitting and/or standing);Decreased safety awareness;Decreased knowledge of use of DME or AE;Decreased knowledge of precautions      OT Treatment/Interventions: Self-care/ADL training;DME and/or AE instruction;Therapeutic activities;Patient/family education;Balance training;Therapeutic exercise;Manual therapy;Modalities    OT Goals(Current goals can be found in the care plan section) Acute Rehab OT Goals Patient Stated Goal: to be able to go home with better equipment OT Goal Formulation: With patient/family Time For Goal Achievement:  09/18/22 Potential to Achieve Goals: Good  OT Frequency: Min 2X/week    Co-evaluation              AM-PAC OT "6 Clicks" Daily Activity     Outcome Measure Help from another person eating meals?: None Help from another person taking care of personal grooming?: None Help from another person toileting, which includes using toliet, bedpan, or urinal?: A Little Help from another person bathing (including washing, rinsing, drying)?: A Lot Help from another person to put on and taking off regular upper body clothing?: A Little Help from another person to put on and taking off regular lower body clothing?: A Lot 6 Click Score: 18   End of Session Equipment Utilized During Treatment: Gait belt;Rolling walker (2 wheels) Nurse Communication: Mobility status;Precautions;Weight bearing status  Activity Tolerance: Patient tolerated treatment well Patient left: in bed;with call bell/phone within reach;with bed alarm set  OT Visit Diagnosis: Unsteadiness on feet (R26.81);Muscle weakness (generalized) (M62.81)                Time: 1610-9604 OT Time Calculation (min): 23 min Charges:  OT General Charges $OT Visit: 1 Visit OT Evaluation $OT Eval Moderate  Complexity: 1 Mod   Brynn, OTR/L  Acute Rehabilitation Services Office: [phone] .   Mateo Flow 09/04/2022, 4:10 PM

## 2022-09-04 NOTE — Plan of Care (Signed)
  Problem: Education: Goal: Knowledge of the prescribed therapeutic regimen will improve Outcome: Not Progressing Goal: Ability to verbalize activity precautions or restrictions will improve Outcome: Not Progressing Goal: Understanding of discharge needs will improve Outcome: Not Progressing   Problem: Activity: Goal: Ability to perform//tolerate increased activity and mobilize with assistive devices will improve Outcome: Not Progressing   Problem: Clinical Measurements: Goal: Postoperative complications will be avoided or minimized Outcome: Not Progressing   Problem: Self-Care: Goal: Ability to meet self-care needs will improve Outcome: Not Progressing   Problem: Skin Integrity: Goal: Demonstration of wound healing without infection will improve Outcome: Not Progressing   Problem: Education: Goal: Knowledge of General Education information will improve Description: Including pain rating scale, medication(s)/side effects and non-pharmacologic comfort measures Outcome: Not Progressing   Problem: Health Behavior/Discharge Planning: Goal: Ability to manage health-related needs will improve Outcome: Not Progressing

## 2022-09-04 NOTE — Evaluation (Signed)
Physical Therapy Evaluation Patient Details Name: [name] MRN: [id number] DOB: Jun 02, 1950 Today's Date: 09/04/2022  History of Present Illness  72 yo male s/p R BKA 09/04/22 PMH atrial fibrillation COPD, CHF Fibromyalgia HTN, HLD, pacemaker, Aortic valve replacement back surgery x3 remote smoker  Clinical Impression  Patient presents with decreased mobility due to pain in R residual limb, decreased balance, decreased activity tolerance and decreased knowledge of use of DME.  Currently min A for mobility in the room for short distance ambulation with RW.  Previously needing wife to assist due to pain in foot and keeping NWB using desk chair at home.  Feel he will benefit from further education on management of residual limb, progressing mobility while protecting intact L foot and for maximizing independence for d/c home with wife support and follow up HHPT.      Recommendations for follow up therapy are one component of a multi-disciplinary discharge planning process, led by the attending physician.  Recommendations may be updated based on patient status, additional functional criteria and insurance authorization.  Follow Up Recommendations       Assistance Recommended at Discharge Intermittent Supervision/Assistance  Patient can return home with the following  A little help with walking and/or transfers    Equipment Recommendations Rolling walker (2 wheels);Wheelchair (measurements PT);Wheelchair cushion (measurements PT);BSC/3in1  Recommendations for Other Services       Functional Status Assessment Patient has had a recent decline in their functional status and demonstrates the ability to make significant improvements in function in a reasonable and predictable amount of time.     Precautions / Restrictions Precautions Precautions: Fall Precaution Comments: residual limb protector, wound vac Restrictions Weight Bearing Restrictions: Yes RLE Weight Bearing: Non weight bearing       Mobility  Bed Mobility Overal bed mobility: Modified Independent                  Transfers Overall transfer level: Needs assistance Equipment used: Rolling walker (2 wheels) Transfers: Sit to/from Stand Sit to Stand: Min assist           General transfer comment: up to stand at RW assist for balance    Ambulation/Gait Ambulation/Gait assistance: Min guard Gait Distance (Feet): 6 Feet Assistive device: Rolling walker (2 wheels) Gait Pattern/deviations: Step-to pattern       General Gait Details: using RW and hopping toward window; then turned to sit in Best boy    Modified Rankin (Stroke Patients Only)       Balance Overall balance assessment: Needs assistance   Sitting balance-Leahy Scale: Good     Standing balance support: Bilateral upper extremity supported Standing balance-Leahy Scale: Poor Standing balance comment: UE support for balance due to R BKA                             Pertinent Vitals/Pain Pain Assessment Pain Assessment: 0-10 Pain Score: 8  Pain Location: leg Pain Descriptors / Indicators: Burning Pain Intervention(s): Monitored during session, Patient requesting pain meds-RN notified    Home Living Family/patient expects to be discharged to:: Private residence Living Arrangements: Spouse/significant other Available Help at Discharge: Available 24 hours/day Type of Home: House Home Access: Ramped entrance       Home Layout: One level Home Equipment: None Additional Comments: 2 german shephards and mini schnauzer    Prior Function Prior Level of  Function : Needs assist             Mobility Comments: sitting in computer chair and wife pushing from room to room ADLs Comments: wife helping with bathing and dressing     Hand Dominance   Dominant Hand: Right    Extremity/Trunk Assessment   Upper Extremity Assessment Upper Extremity Assessment: Defer  to OT evaluation    Lower Extremity Assessment Lower Extremity Assessment: RLE deficits/detail RLE Deficits / Details: lifts antigravity unaided and able to flex and extend knee; removed then re-applied limb protector    Cervical / Trunk Assessment Cervical / Trunk Assessment: Normal  Communication   Communication: No difficulties  Cognition Arousal/Alertness: Awake/alert Behavior During Therapy: WFL for tasks assessed/performed Overall Cognitive Status: Within Functional Limits for tasks assessed                                          General Comments General comments (skin integrity, edema, etc.): wife present and supportive, wound vac in place, educated on floating heel on L foot to prevent breakdown and need for wheelchair at d/c again to protect L foot    Exercises Amputee Exercises Quad Sets: AROM, Right, Supine (3 reps) Towel Squeeze: AROM, Both, Supine (3 reps) Knee Flexion: AROM, Right (3 reps)   Assessment/Plan    PT Assessment Patient needs continued PT services  PT Problem List Decreased balance;Decreased knowledge of precautions;Pain;Decreased activity tolerance;Decreased safety awareness;Decreased knowledge of use of DME;Decreased mobility       PT Treatment Interventions DME instruction;Functional mobility training;Balance training;Patient/family education;Gait training;Therapeutic activities;Therapeutic exercise;Wheelchair mobility training    PT Goals (Current goals can be found in the Care Plan section)  Acute Rehab PT Goals Patient Stated Goal: to return home to independent PT Goal Formulation: With patient/family Time For Goal Achievement: 09/18/22 Potential to Achieve Goals: Good    Frequency Min 4X/week     Co-evaluation               AM-PAC PT "6 Clicks" Mobility  Outcome Measure Help needed turning from your back to your side while in a flat bed without using bedrails?: A Little Help needed moving from lying on your  back to sitting on the side of a flat bed without using bedrails?: A Little Help needed moving to and from a bed to a chair (including a wheelchair)?: A Little Help needed standing up from a chair using your arms (e.g., wheelchair or bedside chair)?: A Little Help needed to walk in hospital room?: Total Help needed climbing 3-5 steps with a railing? : Total 6 Click Score: 14    End of Session Equipment Utilized During Treatment: Gait belt;Other (comment) (wound vac, limb protector) Activity Tolerance: Patient tolerated treatment well Patient left: in chair;with call bell/phone within reach;with chair alarm set;with family/visitor present Nurse Communication: Mobility status PT Visit Diagnosis: Other abnormalities of gait and mobility (R26.89);Pain Pain - Right/Left: Right Pain - part of body: Leg    Time: 1610-9604 PT Time Calculation (min) (ACUTE ONLY): 24 min   Charges:   PT Evaluation $PT Eval Moderate Complexity: 1 Mod PT Treatments $Gait Training: 8-22 mins        Sheran Lawless, PT Acute Rehabilitation Services Office:[phone] 09/04/2022   [name] 09/04/2022, 5:49 PM

## 2022-09-04 NOTE — Anesthesia Postprocedure Evaluation (Signed)
Anesthesia Post Note  Patient: [name]  Procedure(s) Performed: RIGHT BELOW KNEE AMPUTATION (Right: Knee)     Patient location during evaluation: PACU Anesthesia Type: Regional and General Level of consciousness: awake and alert, oriented and patient cooperative Pain management: pain level controlled Vital Signs Assessment: post-procedure vital signs reviewed and stable Respiratory status: spontaneous breathing, nonlabored ventilation and respiratory function stable Cardiovascular status: blood pressure returned to baseline and stable Postop Assessment: no apparent nausea or vomiting Anesthetic complications: no   No notable events documented.  Last Vitals:  Vitals:   09/04/22 1115 09/04/22 1125  BP: 127/69 129/71  Pulse: 63 65  Resp: 16 19  Temp:  36.6 C  SpO2: 94% 93%    Last Pain:  Vitals:   09/04/22 1139  TempSrc:   PainSc: 8                  Lannie Fields

## 2022-09-04 NOTE — Transfer of Care (Signed)
Immediate Anesthesia Transfer of Care Note  Patient: [name]  Procedure(s) Performed: RIGHT BELOW KNEE AMPUTATION (Right: Knee)  Patient Location: PACU  Anesthesia Type:General and Regional  Level of Consciousness: sedated  Airway & Oxygen Therapy: Patient Spontanous Breathing  Post-op Assessment: Report given to RN and Post -op Vital signs reviewed and stable  Post vital signs: Reviewed and stable  Last Vitals:  Vitals Value Taken Time  BP 124/72 09/04/22 1046  Temp    Pulse 63 09/04/22 1048  Resp 15 09/04/22 1048  SpO2 93 % 09/04/22 1048  Vitals shown include unvalidated device data.  Last Pain:  Vitals:   09/04/22 0845  PainSc: 7       Patients Stated Pain Goal: 2 (09/04/22 0845)  Complications: No notable events documented.

## 2022-09-04 NOTE — Op Note (Signed)
09/04/2022  10:43 AM  PATIENT:  [name]    PRE-OPERATIVE DIAGNOSIS:  Gangrene Right Foot  POST-OPERATIVE DIAGNOSIS:  Same  PROCEDURE:  RIGHT BELOW KNEE AMPUTATION Application of Kerecis micro graft 38 cm and Kerecis sheet 7 x 10 cm. Application of Prevena customizable and Prevena arthroform wound VAC dressings Application of Vive Wear stump shrinker and the [employer] limb protector  SURGEON:  [name], MD  ANESTHESIA:   General  PREOPERATIVE INDICATIONS:  [name] is a  72 y.o. male with a diagnosis of Gangrene Right Foot who failed conservative measures and elected for surgical management.    The risks benefits and alternatives were discussed with the patient preoperatively including but not limited to the risks of infection, bleeding, nerve injury, cardiopulmonary complications, the need for revision surgery, among others, and the patient was willing to proceed.  OPERATIVE IMPLANTS:   Implant Name Type Inv. Item Serial No. Manufacturer Lot No. LRB No. Used Action  GRAFT SKIN WND MICRO 38 - [id number] Tissue GRAFT SKIN WND MICRO 38  [employer] [id number] Right 1 Implanted  GRAFT SKIN WND OMEGA3 SB 7X10 - [id number] Tissue GRAFT SKIN WND OMEGA3 SB 7X10  [employer] [id number] Right 1 Implanted     OPERATIVE FINDINGS: Tissue margins clear good petechial bleeding.  OPERATIVE PROCEDURE: Patient was brought to the operating room after undergoing a regional anesthetic.  After adequate levels anesthesia were obtained a thigh tourniquet was placed and the lower extremity was prepped using DuraPrep draped into a sterile field. The foot was draped out of the sterile field with impervious stockinette.  A timeout was called and the tourniquet inflated.  A transverse skin incision was made 12 cm distal to the tibial tubercle, the incision curved proximally, and a large posterior flap was created.  The tibia was transected just proximal to the skin incision and beveled  anteriorly.  The fibula was transected just proximal to the tibial incision.  The sciatic nerve was pulled cut and allowed to retract.  The vascular bundles were suture ligated with 2-0 silk.  The tourniquet was deflated and hemostasis obtained.    Drill holes were placed through the tibia and fibula to secure the [facility] tissue graft and the gastrocnemius fascia.    The Kerecis micro powder 38 cm was applied to the open wound that has a 200 cm surface area.  The 7 x 10 cm Kerecis sheet was then folded and secured to the distal tibia and fibula with #1 Vicryl.  A separate drill hole was then used to secure the [facility] tissue graft and the gastrocnemius fascia to the dorsum of the tibia.    The deep and superficial fascial layers were closed using #1 Vicryl.  The skin was closed using staples.    The Prevena customizable dressing was applied this was overwrapped with the arthroform sponge.  Collier Flowers was used to secure the sponges and the circumferential compression was secured to the skin with Dermatac.  This was connected to the wound VAC pump and had a good suction fit this was covered with a stump shrinker and a limb protector.  Patient was taken to the PACU in stable condition.   DISCHARGE PLANNING:  Antibiotic duration: 24-hour antibiotics  Weightbearing: Nonweightbearing on the operative extremity  Pain medication: Opioid pathway  Dressing care/ Wound VAC: Continue wound VAC with the Prevena plus pump at discharge for 1 week  Ambulatory devices: Walker or kneeling scooter  Discharge to: Discharge planning based on  recommendations per physical therapy  Follow-up: In the office 1 week after discharge.

## 2022-09-04 NOTE — Interval H&P Note (Signed)
History and Physical Interval Note:  09/04/2022 9:37 AM  [name]  has presented today for surgery, with the diagnosis of Gangrene Right Foot.  The various methods of treatment have been discussed with the patient and family. After consideration of risks, benefits and other options for treatment, the patient has consented to  Procedure(s): RIGHT BELOW KNEE AMPUTATION (Right) as a surgical intervention.  The patient's history has been reviewed, patient examined, no change in status, stable for surgery.  I have reviewed the patient's chart and labs.  Questions were answered to the patient's satisfaction.     [name]

## 2022-09-04 NOTE — Anesthesia Procedure Notes (Signed)
Anesthesia Regional Block: Adductor canal block   Pre-Anesthetic Checklist: , timeout performed,  Correct Patient, Correct Site, Correct Laterality,  Correct Procedure, Correct Position, site marked,  Risks and benefits discussed,  Surgical consent,  Pre-op evaluation,  At surgeon's request and post-op pain management  Laterality: Right  Prep: Maximum Sterile Barrier Precautions used, chloraprep       Needles:  Injection technique: Single-shot  Needle Type: Echogenic Stimulator Needle     Needle Length: 9cm  Needle Gauge: 22     Additional Needles:   Procedures:,,,, ultrasound used (permanent image in chart),,    Narrative:  Start time: 09/04/2022 9:15 AM End time: 09/04/2022 9:20 AM Injection made incrementally with aspirations every 5 mL.  Performed by: Personally  Anesthesiologist: Lannie Fields, DO  Additional Notes: Monitors applied. No increased pain on injection. No increased resistance to injection. Injection made in 5cc increments. Good needle visualization. Patient tolerated procedure well.

## 2022-09-04 NOTE — Progress Notes (Signed)
   09/04/22 1200  Pre-Screen Questions- If "YES" to any of the following questions, STOP the screen, keep NPO, and place order for SLP eval and treat   Home diet required thickened liquids No  Trach tube present No  Radiation to Head/Neck No  Patient Readiness for Screen - If "YES" to any of the following questions, WAIT to screen, keep NPO  Is patient lethargic or unable to stay alert/awake? No  HOB restricted to <30 degrees No  NPO for planned procedure No  Brief Cognitive Screen- Aspiration Risk Assessment  Is patient oriented to name, place, or year? Yes  Able to open mouth, stick out tongue, or smile Yes  Oral Mechanism Exam  Able to seal lips Yes  Able to move tongue from side to side Yes  Face is symmetric Yes  Mandatory Oral Care Performed   Mandatory oral care performed Yes  3 oz Water Swallow Challenge  Does the patient stop drinking? No  Does the patient cough/choke? No  Screening Result Passed     Patient arrived from PACU, comfort afforded. VSS, pain assessed.

## 2022-09-04 NOTE — Anesthesia Procedure Notes (Signed)
Anesthesia Regional Block: Popliteal block   Pre-Anesthetic Checklist: , timeout performed,  Correct Patient, Correct Site, Correct Laterality,  Correct Procedure, Correct Position, site marked,  Risks and benefits discussed,  Surgical consent,  Pre-op evaluation,  At surgeon's request and post-op pain management  Laterality: Right  Prep: Maximum Sterile Barrier Precautions used, chloraprep       Needles:  Injection technique: Single-shot  Needle Type: Echogenic Stimulator Needle     Needle Length: 9cm  Needle Gauge: 22     Additional Needles:   Procedures:,,,, ultrasound used (permanent image in chart),,    Narrative:  Start time: 09/04/2022 9:10 AM End time: 09/04/2022 9:15 AM Injection made incrementally with aspirations every 5 mL.  Performed by: Personally  Anesthesiologist: Lannie Fields, DO  Additional Notes: Monitors applied. No increased pain on injection. No increased resistance to injection. Injection made in 5cc increments. Good needle visualization. Patient tolerated procedure well.

## 2022-09-04 NOTE — Anesthesia Procedure Notes (Signed)
Procedure Name: LMA Insertion Date/Time: 09/04/2022 9:51 AM  Performed by: Gus Puma, CRNAPre-anesthesia Checklist: Patient identified, Emergency Drugs available, Suction available and Patient being monitored Patient Re-evaluated:Patient Re-evaluated prior to induction Oxygen Delivery Method: Circle System Utilized Preoxygenation: Pre-oxygenation with 100% oxygen Induction Type: IV induction Ventilation: Mask ventilation without difficulty LMA: LMA inserted LMA Size: 4.0 Number of attempts: 1 Airway Equipment and Method: Bite block Placement Confirmation: positive ETCO2 Tube secured with: Tape Dental Injury: Teeth and Oropharynx as per pre-operative assessment

## 2022-09-05 ENCOUNTER — Encounter (HOSPITAL_COMMUNITY): Payer: Self-pay | Admitting: Orthopedic Surgery

## 2022-09-05 LAB — BASIC METABOLIC PANEL
Anion gap: 8 (ref 5–15)
BUN: 30 mg/dL — ABNORMAL HIGH (ref 8–23)
CO2: 26 mmol/L (ref 22–32)
Calcium: 8.4 mg/dL — ABNORMAL LOW (ref 8.9–10.3)
Chloride: 99 mmol/L (ref 98–111)
Creatinine, Ser: 1.51 mg/dL — ABNORMAL HIGH (ref 0.61–1.24)
GFR, Estimated: 48 mL/min — ABNORMAL LOW (ref >60)
Glucose, Bld: 122 mg/dL — ABNORMAL HIGH (ref 70–99)
Potassium: 4.7 mmol/L (ref 3.5–5.1)
Sodium: 133 mmol/L — ABNORMAL LOW (ref 135–145)

## 2022-09-05 LAB — CBC
HCT: 34.9 % — ABNORMAL LOW (ref 39.0–52.0)
Hemoglobin: 11.2 g/dL — ABNORMAL LOW (ref 13.0–17.0)
MCH: 30.9 pg (ref 26.0–34.0)
MCHC: 32.1 g/dL (ref 30.0–36.0)
MCV: 96.4 fL (ref 80.0–100.0)
Platelets: 123 10*3/uL — ABNORMAL LOW (ref 150–400)
RBC: 3.62 MIL/uL — ABNORMAL LOW (ref 4.22–5.81)
RDW: 15.5 % (ref 11.5–15.5)
WBC: 9.3 10*3/uL (ref 4.0–10.5)
nRBC: 0 % (ref 0.0–0.2)

## 2022-09-05 LAB — GLUCOSE, CAPILLARY
Glucose-Capillary: 126 mg/dL — ABNORMAL HIGH (ref 70–99)
Glucose-Capillary: 147 mg/dL — ABNORMAL HIGH (ref 70–99)
Glucose-Capillary: 191 mg/dL — ABNORMAL HIGH (ref 70–99)

## 2022-09-05 LAB — SURGICAL PATHOLOGY

## 2022-09-05 NOTE — Progress Notes (Signed)
Inpatient Rehabilitation Admissions Coordinator   I met at bedside with patient . He states he and his wife prefer d/c home with [facility] and Therapy recommendations are for [facility]. We will sign off.  Ottie Glazier, RN, MSN Rehab Admissions Coordinator [phone] 09/05/2022 12:37 PM

## 2022-09-05 NOTE — Progress Notes (Cosign Needed)
    Durable Medical Equipment  (From admission, onward)           Start     Ordered   09/05/22 1525  For home use only DME Bedside commode  Once       Comments: Confine to one room  Question:  Patient needs a bedside commode to treat with the following condition  Answer:  S/P BKA (below knee amputation), right (HCC)   09/05/22 1526   09/05/22 1525  For home use only DME lightweight manual wheelchair with seat cushion  Once       Comments: Patient suffers from BKA which impairs their ability to perform daily activities like bathing and dressing in the home.  A walker will not resolve  issue with performing activities of daily living. A wheelchair will allow patient to safely perform daily activities. Patient is not able to propel themselves in the home using a standard weight wheelchair due to general weakness. Patient can self propel in the lightweight wheelchair. Length of need Lifetime. Accessories: elevating leg rests (ELRs), wheel locks, extensions and anti-tippers.   09/05/22 1526   09/05/22 1524  For home use only DME Walker rolling  Once       Question Answer Comment  Walker: With 5 Inch Wheels   Patient needs a walker to treat with the following condition S/P BKA (below knee amputation), right (HCC)      09/05/22 1526

## 2022-09-05 NOTE — TOC Initial Note (Addendum)
Transition of Care [facility]) - Initial/Assessment Note    Patient Details  Name: [name] MRN: [id number] Date of Birth: April 25, 1951  Transition of Care [facility]) CM/SW Contact:    Epifanio Lesches, RN Phone Number: 09/05/2022, 2:54 PM  Clinical Narrative:                     - s/p R BKA 09/04/22    Pt is from home with wife. PTA independent with ADL's, no DME usage. Pt resides in a one level home. States wife is arranging a ramp to be placed prior to d/c. Pt states not interested in SNF placement if needed. PT/OT  evaluations recommending home health services.  Pt is  agreeable to home health services. Pt without provider preference.Referral made with [facility] and accepted. Home health PT orders placed. Face to face will be needed from MD for home health PT/OT services.  TOC team following for needs.  Expected Discharge Plan: Home w Home Health Services Barriers to Discharge: Continued Medical Work up   Patient Goals and CMS Choice            Expected Discharge Plan and Services                                              Prior Living Arrangements/Services     Patient language and need for interpreter reviewed:: Yes Do you feel safe going back to the place where you live?: Yes      Need for Family Participation in Patient Care: Yes (Comment) Care giver support system in place?: Yes (comment)   Criminal Activity/Legal Involvement Pertinent to Current Situation/Hospitalization: No - Comment as needed  Activities of Daily Living Home Assistive Devices/Equipment: None ADL Screening (condition at time of admission) Patient's cognitive ability adequate to safely complete daily activities?: Yes Is the patient deaf or have difficulty hearing?: No Does the patient have difficulty seeing, even when wearing glasses/contacts?: No Does the patient have difficulty concentrating, remembering, or making decisions?: Yes Patient able to express need for assistance  with ADLs?: Yes Does the patient have difficulty dressing or bathing?: No Independently performs ADLs?: Yes (appropriate for developmental age) Does the patient have difficulty walking or climbing stairs?: No Weakness of Legs: Right Weakness of Arms/Hands: None  Permission Sought/Granted   Permission granted to share information with : Yes, Verbal Permission Granted  Share Information with NAME: KAILOR HOLSONBACK  Spouse  [phone]           Emotional Assessment Appearance:: Appears stated age     Orientation: : Oriented to Self, Oriented to Place, Oriented to  Time, Oriented to Situation Alcohol / Substance Use: Not Applicable Psych Involvement: No (comment)  Admission diagnosis:  S/P BKA (below knee amputation), right (HCC) [Z89.511] Patient Active Problem List   Diagnosis Date Noted   Gangrene of right foot (HCC) 09/04/2022   S/P BKA (below knee amputation), right (HCC) 09/04/2022   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) 08/23/2022   CAP (community acquired pneumonia) 12/28/2021   Constipation 12/28/2021   Pain in both lower extremities 07/03/2021   Peripheral neuropathy 07/03/2021   Acute metabolic encephalopathy 06/09/2021   Marijuana dependence (HCC) 06/09/2021   DNR (do not resuscitate) 06/09/2021   Chest pain 06/08/2021   Renal lesion 02/14/2021   Status post cryoablation 02/14/2021   Allergic  rhinitis 12/08/2020   Preop pulmonary/respiratory exam 03/27/2020   Acute on chronic diastolic (congestive) heart failure (HCC) 01/22/2020   Abdominal pain 01/22/2020   Hypothyroidism 12/13/2019   DM2 (diabetes mellitus, type 2) (HCC) 12/13/2019   Chronic kidney disease, stage 3b (HCC) 12/13/2019   Acute bacterial bronchitis 12/13/2019   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   Typical atrial flutter (HCC)    Chronic respiratory failure with hypoxia (HCC) 05/23/2016   CAD (coronary artery disease) 05/23/2016   Persistent atrial fibrillation (HCC)    Atypical  atrial flutter (HCC)    Visit for monitoring Tikosyn therapy 04/16/2016   Chronic diastolic CHF (congestive heart failure) (HCC) 02/03/2016   Cough 12/06/2015   Acute on chronic respiratory failure with hypoxia (HCC) 09/08/2015   Hallucinations 09/04/2015   Lymphadenopathy 09/04/2015   Ascending aortic aneurysm (HCC) 09/04/2015   Hypoxia 09/02/2015   S/P aortic valve replacement with bioprosthetic valve 09/02/2015   Pleural effusion 09/02/2015   S/P AVR 08/22/2015   GERD without esophagitis 09/20/2014   Intrinsic asthma 10/13/2013   Dyspnea 09/15/2013   Pulmonary hypertension (HCC) 09/15/2013   Shortness of breath 05/05/2013   Pacemaker-St.Jude 04/01/2012   Bradycardia 03/31/2012   Second degree AV block 03/31/2012   AV block, 1st degree 02/01/2012   PAF (paroxysmal atrial fibrillation) (HCC) 11/13/2011   Fatigue 11/13/2011   Hypertension 11/13/2011   Aortic stenosis 11/13/2011   Sleep apnea 11/13/2011   PCP:  Alysia Penna, MD Pharmacy:   [facility] [phone] - [place], [place] - [address] [place] [address] Phone: [phone] Fax: [phone]     Social Determinants of Health (SDOH) Social History: SDOH Screenings   Food Insecurity: No Food Insecurity (09/05/2022)  Housing: Low Risk  (09/05/2022)  Transportation Needs: No Transportation Needs (09/05/2022)  Utilities: Not At Risk (09/05/2022)  Tobacco Use: Medium Risk (09/05/2022)   SDOH Interventions:     Readmission Risk Interventions    01/01/2022   10:54 AM  Readmission Risk Prevention Plan  Transportation Screening Complete  PCP or Specialist Appt within 3-5 Days Complete  HRI or Home Care Consult Complete  Social Work Consult for Recovery Care Planning/Counseling Complete  Palliative Care Screening Not Applicable  Medication Review [employer]) Complete

## 2022-09-05 NOTE — Progress Notes (Signed)
Physical Therapy Treatment Patient Details Name: [name] MRN: [id number] DOB: [birth year] Today's Date: 09/05/2022   History of Present Illness 72 yo male s/p R BKA 09/04/22 PMH atrial fibrillation COPD, CHF Fibromyalgia HTN, HLD, pacemaker, Aortic valve replacement back surgery x3 remote smoker   PT Comments    Patient is agreeable to PT. He reports he has already been OOB to chair today. The patient required Min A to stand with the bed in the lowest position with cues for hand placement and positioning of residual limb.  He reports left leg weakness with standing. Min guard provided for safety with standing for 1 minute using rolling walker prior to ambulation. Patient able to ambulate a short distance with Min A with activity tolerance limited by fatigue. Patient is still hopeful to return home at discharge but reports needing to get a new ramp installed prior to discharge home. Anticipate the need for intermittent assistance and continued PT recommended after this hospital stay.    Recommendations for follow up therapy are one component of a multi-disciplinary discharge planning process, led by the attending physician.  Recommendations may be updated based on patient status, additional functional criteria and insurance authorization.  Follow Up Recommendations       Assistance Recommended at Discharge Intermittent Supervision/Assistance  Patient can return home with the following A little help with walking and/or transfers   Equipment Recommendations  Rolling walker (2 wheels);Wheelchair (measurements PT);Wheelchair cushion (measurements PT);BSC/3in1    Recommendations for Other Services       Precautions / Restrictions Precautions Precautions: Fall Restrictions Weight Bearing Restrictions: Yes RLE Weight Bearing: Non weight bearing     Mobility  Bed Mobility Overal bed mobility: Needs Assistance Bed Mobility: Supine to Sit, Sit to Supine     Supine to sit: Min  guard Sit to supine: Supervision   General bed mobility comments: verbal cues for technique. increased time required    Transfers Overall transfer level: Needs assistance Equipment used: Rolling walker (2 wheels) Transfers: Sit to/from Stand Sit to Stand: Min assist           General transfer comment: lifting assistance required for standing. cues for hand placement and positioning of residual limb when sitting    Ambulation/Gait Ambulation/Gait assistance: Min assist Gait Distance (Feet): 3 Feet Assistive device: Rolling walker (2 wheels) Gait Pattern/deviations:  (hop pattern) Gait velocity: decreased   Pre-gait activities: patient able to stand for 1 minute prior to ambulating with RW for support, Min guard required for safety. no dizziness reported with mobility General Gait Details: rolling walker adjusted for proper height. verbal cues for technique. steadying assistance provided and occasional assistance for positioning rolling walker. patient reports left leg weakness with standing.   Stairs             Wheelchair Mobility    Modified Rankin (Stroke Patients Only)       Balance Overall balance assessment: Needs assistance   Sitting balance-Leahy Scale: Good Sitting balance - Comments: patient is able to use the urinal and weight shifting in sitting with no loss of balance                                    Cognition Arousal/Alertness: Awake/alert Behavior During Therapy: WFL for tasks assessed/performed Overall Cognitive Status: Within Functional Limits for tasks assessed  Exercises Amputee Exercises Quad Sets: AROM, Strengthening, Right, 10 reps, Supine    General Comments        Pertinent Vitals/Pain Pain Assessment Pain Assessment: Faces Faces Pain Scale: Hurts little more Pain Location: distal RLE Pain Descriptors / Indicators: Pins and needles Pain  Intervention(s): Limited activity within patient's tolerance, Monitored during session, Repositioned    Home Living                          Prior Function            PT Goals (current goals can now be found in the care plan section) Acute Rehab PT Goals Patient Stated Goal: to return home to independent PT Goal Formulation: With patient Time For Goal Achievement: 09/18/22 Potential to Achieve Goals: Good Progress towards PT goals: Progressing toward goals    Frequency    Min 4X/week      PT Plan Current plan remains appropriate    Co-evaluation              AM-PAC PT "6 Clicks" Mobility   Outcome Measure  Help needed turning from your back to your side while in a flat bed without using bedrails?: A Little Help needed moving from lying on your back to sitting on the side of a flat bed without using bedrails?: A Little Help needed moving to and from a bed to a chair (including a wheelchair)?: A Little Help needed standing up from a chair using your arms (e.g., wheelchair or bedside chair)?: A Little Help needed to walk in hospital room?: Total Help needed climbing 3-5 steps with a railing? : Total 6 Click Score: 14    End of Session Equipment Utilized During Treatment: Gait belt (wound vac, limb protector) Activity Tolerance: Patient tolerated treatment well Patient left: in bed;with call bell/phone within reach;with bed alarm set   PT Visit Diagnosis: Other abnormalities of gait and mobility (R26.89);Pain Pain - Right/Left: Right Pain - part of body: Leg     Time: 1610-9604 PT Time Calculation (min) (ACUTE ONLY): 25 min  Charges:  $Gait Training: 8-22 mins $Therapeutic Activity: 8-22 mins                     Donna Bernard, PT, MPT    Ina Homes 09/05/2022, 1:41 PM

## 2022-09-05 NOTE — Progress Notes (Signed)
OT Cancellation Note  Patient Details Name: [name] MRN: [id number] DOB: 1951-04-27   Cancelled Treatment:    Reason Eval/Treat Not Completed: Other (comment) (Planned for family education, but wife unavailable. Will plan to see tomorrow when wife available.)  Tyler Deis, OTR/L [facility] Acute Rehabilitation Office: [phone]   Myrla Halsted 09/05/2022, 1:08 PM

## 2022-09-05 NOTE — Progress Notes (Signed)
Patient ID: [name], male   DOB: 28-Apr-1951, 72 y.o.   MRN: [id number] Patient is postoperative day 1 transtibial amputation on the right.  There is 50 cc in the wound VAC canister.  Will plan for physical therapy progressive ambulation discharge planning for inpatient versus outpatient rehab.

## 2022-09-06 LAB — BASIC METABOLIC PANEL
Anion gap: 10 (ref 5–15)
BUN: 30 mg/dL — ABNORMAL HIGH (ref 8–23)
CO2: 27 mmol/L (ref 22–32)
Calcium: 9.2 mg/dL (ref 8.9–10.3)
Chloride: 99 mmol/L (ref 98–111)
Creatinine, Ser: 1.55 mg/dL — ABNORMAL HIGH (ref 0.61–1.24)
GFR, Estimated: 47 mL/min — ABNORMAL LOW (ref >60)
Glucose, Bld: 133 mg/dL — ABNORMAL HIGH (ref 70–99)
Potassium: 4.4 mmol/L (ref 3.5–5.1)
Sodium: 136 mmol/L (ref 135–145)

## 2022-09-06 LAB — CBC
HCT: 37.6 % — ABNORMAL LOW (ref 39.0–52.0)
Hemoglobin: 12.7 g/dL — ABNORMAL LOW (ref 13.0–17.0)
MCH: 31.2 pg (ref 26.0–34.0)
MCHC: 33.8 g/dL (ref 30.0–36.0)
MCV: 92.4 fL (ref 80.0–100.0)
Platelets: 137 10*3/uL — ABNORMAL LOW (ref 150–400)
RBC: 4.07 MIL/uL — ABNORMAL LOW (ref 4.22–5.81)
RDW: 15.4 % (ref 11.5–15.5)
WBC: 12.6 10*3/uL — ABNORMAL HIGH (ref 4.0–10.5)
nRBC: 0 % (ref 0.0–0.2)

## 2022-09-06 LAB — GLUCOSE, CAPILLARY: Glucose-Capillary: 169 mg/dL — ABNORMAL HIGH (ref 70–99)

## 2022-09-06 NOTE — Progress Notes (Signed)
Inpatient Rehabilitation Admissions Coordinator   I have discussed with TOC RN CM, Marylene Land, that [facility] medicare will not approve CIR admit for an amputation. Other rehab venues should be pursued if patient requested further rehab before d/c home.  Ottie Glazier, RN, MSN Rehab Admissions Coordinator [phone] 09/06/2022 3:15 PM

## 2022-09-06 NOTE — Progress Notes (Signed)
Physical Therapy Treatment Patient Details Name: [name] MRN: [id number] DOB: 1951/04/19 Today's Date: 09/06/2022   History of Present Illness 72 yo male s/p R BKA 09/04/22 PMH atrial fibrillation COPD, CHF Fibromyalgia HTN, HLD, pacemaker, Aortic valve replacement back surgery x3 remote smoker    PT Comments    Pt received in supine and agreeable to session. Pt requiring increased assist with mobility tasks this session and pt's wife reporting increased confusion. Pt able to stand and pivot to recliner with up to mod A for balance. Pt abel to tolerate 3 more standing trials from recliner with increased difficulty transitioning hands from armrests to RW. Pt's wife expressing concerns about being able to take care of him at home with the level of assist he currently requires. After discussion with supervising PT Ryan L., the discharge recommendation has been updated to continued inpatient follow up therapy, <3 hours/day. Acutely, pt continues to benefit from PT services to progress toward functional mobility goals.      Recommendations for follow up therapy are one component of a multi-disciplinary discharge planning process, led by the attending physician.  Recommendations may be updated based on patient status, additional functional criteria and insurance authorization.     Assistance Recommended at Discharge Intermittent Supervision/Assistance  Patient can return home with the following A little help with walking and/or transfers   Equipment Recommendations  Rolling walker (2 wheels);Wheelchair (measurements PT);Wheelchair cushion (measurements PT);BSC/3in1    Recommendations for Other Services       Precautions / Restrictions Precautions Precautions: Fall Precaution Comments: residual limb protector, wound vac Restrictions Weight Bearing Restrictions: Yes RLE Weight Bearing: Non weight bearing     Mobility  Bed Mobility Overal bed mobility: Needs Assistance Bed Mobility:  Supine to Sit     Supine to sit: Min guard     General bed mobility comments: increased time and use of bedrail    Transfers Overall transfer level: Needs assistance Equipment used: Rolling walker (2 wheels) Transfers: Sit to/from Stand, Bed to chair/wheelchair/BSC Sit to Stand: Mod assist           General transfer comment: mod A for power up from elevated EOB and recliner x3. Pt demonstrating difficulty transitioning hands from recliner chair arms to RW requiring mod A for balance. Pt able to demonstrate small hops to pivot to recliner requiring dense cues for sequencing and direction.    Ambulation/Gait               General Gait Details: unable to progress       Balance Overall balance assessment: Needs assistance Sitting-balance support: Bilateral upper extremity supported, Single extremity supported, No upper extremity supported Sitting balance-Leahy Scale: Poor Sitting balance - Comments: requiring support of at least one UE this session. Pt with posterior lean without UE support   Standing balance support: Bilateral upper extremity supported Standing balance-Leahy Scale: Poor Standing balance comment: with RW support                            Cognition Arousal/Alertness: Awake/alert Behavior During Therapy: WFL for tasks assessed/performed Overall Cognitive Status: Difficult to assess                                 General Comments: Pt with intermittent confusion. When asked to reach back to the recliner chair arm, pt stated that his hand was already on  it, however it was still on the RW. Pt requiring increased cues for sequencing and initiation. Pt's wife present throughout session and reporting that this confusion is not his baseline        Exercises      General Comments General comments (skin integrity, edema, etc.): Pt's wife present throughout session      Pertinent Vitals/Pain Pain Assessment Pain Assessment:  Faces Faces Pain Scale: Hurts little more Pain Location: distal RLE Pain Descriptors / Indicators: Grimacing, Guarding Pain Intervention(s): Monitored during session, Repositioned     PT Goals (current goals can now be found in the care plan section) Acute Rehab PT Goals Patient Stated Goal: to return home to independent PT Goal Formulation: With patient Time For Goal Achievement: 09/18/22 Potential to Achieve Goals: Good Progress towards PT goals: Progressing toward goals    Frequency    Min 4X/week      PT Plan Discharge plan needs to be updated       AM-PAC PT "6 Clicks" Mobility   Outcome Measure  Help needed turning from your back to your side while in a flat bed without using bedrails?: A Little Help needed moving from lying on your back to sitting on the side of a flat bed without using bedrails?: A Little Help needed moving to and from a bed to a chair (including a wheelchair)?: A Lot Help needed standing up from a chair using your arms (e.g., wheelchair or bedside chair)?: A Lot Help needed to walk in hospital room?: Total Help needed climbing 3-5 steps with a railing? : Total 6 Click Score: 12    End of Session Equipment Utilized During Treatment: Gait belt (Limb protector) Activity Tolerance: Patient tolerated treatment well Patient left: in chair;with chair alarm set;with family/visitor present;with call bell/phone within reach Nurse Communication: Mobility status PT Visit Diagnosis: Other abnormalities of gait and mobility (R26.89);Pain Pain - Right/Left: Right Pain - part of body: Leg     Time: 1440-1503 PT Time Calculation (min) (ACUTE ONLY): 23 min  Charges:  $Therapeutic Activity: 23-37 mins                     Johny Shock, PTA Acute Rehabilitation Services Secure Chat Preferred  [phone]    Johny Shock 09/06/2022, 3:26 PM

## 2022-09-06 NOTE — Plan of Care (Signed)
  Problem: Education: Goal: Knowledge of the prescribed therapeutic regimen will improve Outcome: Not Progressing Goal: Ability to verbalize activity precautions or restrictions will improve Outcome: Not Progressing Goal: Understanding of discharge needs will improve Outcome: Not Progressing   Problem: Activity: Goal: Ability to perform//tolerate increased activity and mobilize with assistive devices will improve Outcome: Not Progressing   Problem: Clinical Measurements: Goal: Postoperative complications will be avoided or minimized Outcome: Not Progressing   Problem: Self-Concept: Goal: Ability to maintain and perform role responsibilities to the fullest extent possible will improve Outcome: Not Progressing

## 2022-09-06 NOTE — Progress Notes (Signed)
Patient ID: [name], male   DOB: [birth year], 72 y.o.   MRN: [id number] Patient without complaints this morning.  The VAC was turned off and not plugged in.  The VAC suction was reestablished.  There was 50 cc in the wound VAC canister.

## 2022-09-06 NOTE — Progress Notes (Signed)
Occupational Therapy Treatment Patient Details Name: [name] MRN: [id number] DOB: 06/27/1950 Today's Date: 09/06/2022   History of present illness 72 yo male s/p R BKA 09/04/22 PMH atrial fibrillation COPD, CHF Fibromyalgia HTN, HLD, pacemaker, Aortic valve replacement back surgery x3 remote smoker   OT comments  Pt with minor labored breathing on arrival with SpO2 of 85% so donned 2L O2 via [place] and >93%. Inconsistent ability to follow commands today, requiring increased time, less conversational, and appearing restless (RN notified). Wife present for education regarding LB ADL, transfers, sensory re-education, skin checks, and donning/doffing limb protector. Due to pt inconsistently following commands, wife assisted with all portions, but deferred hands on with transfer training; engaged in visual demo with therapist and pt. Wife with concerns regarding home; requesting update to inpatient rehab <3 hours/day. Believe this to be the safest and most beneficial option at this time.    Recommendations for follow up therapy are one component of a multi-disciplinary discharge planning process, led by the attending physician.  Recommendations may be updated based on patient status, additional functional criteria and insurance authorization.    Assistance Recommended at Discharge    Patient can return home with the following  A little help with walking and/or transfers;A little help with bathing/dressing/bathroom;Assist for transportation   Equipment Recommendations  BSC/3in1;Wheelchair (measurements OT);Wheelchair cushion (measurements OT);Other (comment) (RW, RN for wound vac if home)    Recommendations for Other Services      Precautions / Restrictions Precautions Precautions: Fall Restrictions Weight Bearing Restrictions: Yes RLE Weight Bearing: Non weight bearing       Mobility Bed Mobility Overal bed mobility: Needs Assistance Bed Mobility: Supine to Sit, Sit to Supine      Supine to sit: Mod assist Sit to supine: Mod assist   General bed mobility comments: Max cueing; assist for sequencing and truncal elevation as well as bring BLE back into bed    Transfers Overall transfer level: Needs assistance Equipment used: Rolling walker (2 wheels) Transfers: Sit to/from Stand Sit to Stand: Mod assist           General transfer comment: lifting assistance required for standing. cues for hand placement and positioning of residual limb when sitting     Balance Overall balance assessment: Needs assistance Sitting-balance support: No upper extremity supported, Bilateral upper extremity supported, Single extremity supported Sitting balance-Leahy Scale: Poor Sitting balance - Comments: requiring support of at least one UE this session.   Standing balance support: Bilateral upper extremity supported Standing balance-Leahy Scale: Poor                             ADL either performed or assessed with clinical judgement   ADL Overall ADL's : Needs assistance/impaired                     Lower Body Dressing: Maximal assistance;Bed level;With caregiver independent assisting;Cueing for sequencing;Cueing for safety;Minimal assistance Lower Body Dressing Details (indicate cue type and reason): provided education to wife regarding donning/doffing limb protector as well as skin checks; reinforced attention to her own safety and body mechanics. Min A for pt to doff L sock sitting EOb for sitting balance Toilet Transfer: Stand-pivot;Rolling walker (2 wheels);BSC/3in1;Moderate assistance Toilet Transfer Details (indicate cue type and reason): Provided gait belt and demonstration to wife as well as education regarding transfers in general, but pt reporting too fatigued to trial second attempt. Wife with concerns about her  ability to attempt in this state with decr cognition at time of treatment as well. Will cotninue efforst for hands on training with wife  as appropriate.         Functional mobility during ADLs: Minimal assistance      Extremity/Trunk Assessment Upper Extremity Assessment Upper Extremity Assessment: Generalized weakness            Vision       Perception     Praxis      Cognition Arousal/Alertness: Awake/alert Behavior During Therapy: WFL for tasks assessed/performed Overall Cognitive Status: Difficult to assess                                 General Comments: Pt with intermittent lethargy; reporting he did not sleep well and just recently with pain medication. Inconsistently following commands and fidgeting with items in his bed in a restless manner. Of note, pt with mild difficulty breathing on arrival SpO2 at 85%. Replaced O2 via [place] at 2L as he was on RA and pt with improved sats >93%. RN notified. Again notified of change in cognition at end of session. Wife present and reports he can get confused sometimes when he desats and needs to wear his oxygen, but that he is different today as compared to yestderday        Exercises      Shoulder Instructions       General Comments wife present and supportve    Pertinent Vitals/ Pain       Pain Assessment Pain Assessment: 0-10 Pain Score: 8  Pain Location: distal RLE Pain Descriptors / Indicators: Pins and needles Pain Intervention(s): Limited activity within patient's tolerance, Monitored during session  Home Living                                          Prior Functioning/Environment              Frequency  Min 2X/week        Progress Toward Goals  OT Goals(current goals can now be found in the care plan section)  Progress towards OT goals: Progressing toward goals  Acute Rehab OT Goals Patient Stated Goal: get back in bed OT Goal Formulation: With patient/family Time For Goal Achievement: 09/18/22 Potential to Achieve Goals: Good ADL Goals Pt Will Perform Lower Body Dressing: with min guard  assist;with adaptive equipment;sit to/from stand Pt Will Transfer to Toilet: with min guard assist;stand pivot transfer;bedside commode Additional ADL Goal #1: pt will complete w/c transfer mod I Additional ADL Goal #2: pt and wife will don doff residual limb protector mod I  Plan Discharge plan needs to be updated    Co-evaluation                 AM-PAC OT "6 Clicks" Daily Activity     Outcome Measure   Help from another person eating meals?: None Help from another person taking care of personal grooming?: None Help from another person toileting, which includes using toliet, bedpan, or urinal?: A Little Help from another person bathing (including washing, rinsing, drying)?: A Lot Help from another person to put on and taking off regular upper body clothing?: A Little Help from another person to put on and taking off regular lower body clothing?: A Lot 6 Click Score: 18  End of Session Equipment Utilized During Treatment: Gait belt;Rolling walker (2 wheels);Other (comment) (R limb protector)  OT Visit Diagnosis: Unsteadiness on feet (R26.81);Muscle weakness (generalized) (M62.81)   Activity Tolerance Patient tolerated treatment well   Patient Left in bed;with call bell/phone within reach;with bed alarm set;with family/visitor present;with nursing/sitter in room   Nurse Communication Mobility status;Precautions;Weight bearing status;Other (comment) (Pt feeling unwell, O2 donned, questionable ability to follow commands)        Time: 1610-9604 OT Time Calculation (min): 35 min  Charges: OT General Charges $OT Visit: 1 Visit OT Treatments $Self Care/Home Management : 23-37 mins  Tyler Deis, OTR/L [facility] Acute Rehabilitation Office: [phone]   Myrla Halsted 09/06/2022, 1:37 PM

## 2022-09-06 NOTE — Progress Notes (Signed)
Pt seen in room c/o bleeding at surgical site, reinforced with dermatac drape x2 and cover with ace wrap. Attending MD made aware of above, primary nurse aware. No new bleeding noted at this time.

## 2022-09-07 NOTE — Progress Notes (Signed)
Physical Therapy Treatment Patient Details Name: [name] MRN: [id number] DOB: 1951-04-09 Today's Date: 09/07/2022   History of Present Illness 72 yo male s/p R BKA 09/04/22 PMH atrial fibrillation COPD, CHF Fibromyalgia HTN, HLD, pacemaker, Aortic valve replacement back surgery x3 remote smoker    PT Comments    Pt greeted resting in bed and agreeable to session with continued progress towards acute goals. Pt with much improved cognition this date A&Ox4 and able to follow all commands. Pt requiring grossly min guard for bed mobility and functional transfers this session and able to progress gait in room 10' with RW support and min A to steady during hop-to pattern. Pt requiring increased cues for safe posture during gait as pt with tendency to lean posterior when hopping forward on L increasing risk for posterior LOB. Current plan remains appropriate to address deficits and maximize functional independence and decrease caregiver burden, however pt may progress to d/c to home pending appropriate home assist level. Pt continues to benefit from skilled PT services to progress toward functional mobility goals.    Recommendations for follow up therapy are one component of a multi-disciplinary discharge planning process, led by the attending physician.  Recommendations may be updated based on patient status, additional functional criteria and insurance authorization.  Follow Up Recommendations  Can patient physically be transported by private vehicle: No    Assistance Recommended at Discharge Intermittent Supervision/Assistance  Patient can return home with the following A little help with walking and/or transfers   Equipment Recommendations  Rolling walker (2 wheels);Wheelchair (measurements PT);Wheelchair cushion (measurements PT);BSC/3in1    Recommendations for Other Services       Precautions / Restrictions Precautions Precautions: Fall Precaution Comments: residual limb protector,  wound vac Restrictions Weight Bearing Restrictions: Yes RLE Weight Bearing: Non weight bearing     Mobility  Bed Mobility Overal bed mobility: Needs Assistance Bed Mobility: Supine to Sit     Supine to sit: Min guard     General bed mobility comments: increased time and use of bedrail    Transfers Overall transfer level: Needs assistance Equipment used: Rolling walker (2 wheels) Transfers: Sit to/from Stand, Bed to chair/wheelchair/BSC Sit to Stand: Min guard, Min assist           General transfer comment: min A to steady on initial rise, down to min guard from chair    Ambulation/Gait Ambulation/Gait assistance: Min assist Gait Distance (Feet): 10 Feet Assistive device: Rolling walker (2 wheels) Gait Pattern/deviations:  (hop pattern) Gait velocity: decreased     General Gait Details: min A to steady with cues to shift trunk anterior as pt with tendency to lean back when hoping forward on L. pt demosntrating fair UE strength   Stairs             Wheelchair Mobility    Modified Rankin (Stroke Patients Only)       Balance Overall balance assessment: Needs assistance Sitting-balance support: Bilateral upper extremity supported, Single extremity supported, No upper extremity supported Sitting balance-Leahy Scale: Poor Sitting balance - Comments: requiring support of at least one UE this session. Pt with posterior lean without UE support   Standing balance support: Bilateral upper extremity supported Standing balance-Leahy Scale: Poor Standing balance comment: with RW support                            Cognition Arousal/Alertness: Awake/alert Behavior During Therapy: WFL for tasks assessed/performed Overall Cognitive Status:  Within Functional Limits for tasks assessed                                 General Comments: pt A&Ox4 and able to follow all commands        Exercises Amputee Exercises Quad Sets: AROM,  Strengthening, Right, 10 reps Chair Push Up: Both, 10 reps (x10 w/ LLE on floor, x5 with BLEs elevated on leg rest of chair)    General Comments        Pertinent Vitals/Pain Pain Assessment Pain Assessment: Faces Faces Pain Scale: Hurts even more Pain Location: distal RLE Pain Descriptors / Indicators: Grimacing, Guarding Pain Intervention(s): Premedicated before session, Monitored during session, Limited activity within patient's tolerance    Home Living                          Prior Function            PT Goals (current goals can now be found in the care plan section) Acute Rehab PT Goals Patient Stated Goal: to return home to independent PT Goal Formulation: With patient Time For Goal Achievement: 09/18/22 Progress towards PT goals: Progressing toward goals    Frequency    Min 4X/week      PT Plan      Co-evaluation              AM-PAC PT "6 Clicks" Mobility   Outcome Measure  Help needed turning from your back to your side while in a flat bed without using bedrails?: A Little Help needed moving from lying on your back to sitting on the side of a flat bed without using bedrails?: A Little Help needed moving to and from a bed to a chair (including a wheelchair)?: A Little Help needed standing up from a chair using your arms (e.g., wheelchair or bedside chair)?: A Little Help needed to walk in hospital room?: A Lot (for cues) Help needed climbing 3-5 steps with a railing? : Total 6 Click Score: 15    End of Session Equipment Utilized During Treatment: Gait belt (Limb protector) Activity Tolerance: Patient tolerated treatment well Patient left: in chair;with call bell/phone within reach Nurse Communication: Mobility status PT Visit Diagnosis: Other abnormalities of gait and mobility (R26.89);Pain Pain - Right/Left: Right Pain - part of body: Leg     Time: 1610-9604 PT Time Calculation (min) (ACUTE ONLY): 21 min  Charges:  $Gait  Training: 8-22 mins                     Tuwanda Vokes R. PTA Acute Rehabilitation Services Office: [phone]    Catalina Antigua 09/07/2022, 12:24 PM

## 2022-09-07 NOTE — Progress Notes (Signed)
Patient ID: [name], male   DOB: May 18, 1950, 72 y.o.   MRN: [id number] Status post transtibial amputation with total of 50 cc in the wound VAC canister no additional drainage.  Patient not a candidate for inpatient rehab.  Will follow-up with outpatient rehab.

## 2022-09-08 NOTE — NC FL2 (Signed)
[place] MEDICAID FL2 LEVEL OF CARE FORM     IDENTIFICATION  Patient Name: [name] Birthdate: 03/30/1951 Sex: male Admission Date (Current Location): 09/04/2022  [facility] and [place] Number:  Producer, television/film/video and Address:  [facility], [address] [place], [place] [address]      Provider Number: [id number]  Attending Physician Name and Address:  [name], MD  Relative Name and Phone Number:  Braxdon, Rosene (Spouse) [phone] (Mobile)    Current Level of Care: Hospital Recommended Level of Care: Skilled Nursing Facility Prior Approval Number:    Date Approved/Denied:   PASRR Number: [id number]  Discharge Plan: SNF    Current Diagnoses: Patient Active Problem List   Diagnosis Date Noted   Gangrene of right foot (HCC) 09/04/2022   S/P BKA (below knee amputation), right (HCC) 09/04/2022   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) 08/23/2022   CAP (community acquired pneumonia) 12/28/2021   Constipation 12/28/2021   Pain in both lower extremities 07/03/2021   Peripheral neuropathy 07/03/2021   Acute metabolic encephalopathy 06/09/2021   Marijuana dependence (HCC) 06/09/2021   DNR (do not resuscitate) 06/09/2021   Chest pain 06/08/2021   Renal lesion 02/14/2021   Status post cryoablation 02/14/2021   Allergic rhinitis 12/08/2020   Preop pulmonary/respiratory exam 03/27/2020   Acute on chronic diastolic (congestive) heart failure (HCC) 01/22/2020   Abdominal pain 01/22/2020   Hypothyroidism 12/13/2019   DM2 (diabetes mellitus, type 2) (HCC) 12/13/2019   Chronic kidney disease, stage 3b (HCC) 12/13/2019   Acute bacterial bronchitis 12/13/2019   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   Typical atrial flutter (HCC)    Chronic respiratory failure with hypoxia (HCC) 05/23/2016   CAD (coronary artery disease) 05/23/2016   Persistent atrial fibrillation (HCC)    Atypical atrial flutter (HCC)    Visit for monitoring  Tikosyn therapy 04/16/2016   Chronic diastolic CHF (congestive heart failure) (HCC) 02/03/2016   Cough 12/06/2015   Acute on chronic respiratory failure with hypoxia (HCC) 09/08/2015   Hallucinations 09/04/2015   Lymphadenopathy 09/04/2015   Ascending aortic aneurysm (HCC) 09/04/2015   Hypoxia 09/02/2015   S/P aortic valve replacement with bioprosthetic valve 09/02/2015   Pleural effusion 09/02/2015   S/P AVR 08/22/2015   GERD without esophagitis 09/20/2014   Intrinsic asthma 10/13/2013   Dyspnea 09/15/2013   Pulmonary hypertension (HCC) 09/15/2013   Shortness of breath 05/05/2013   Pacemaker-St.Jude 04/01/2012   Bradycardia 03/31/2012   Second degree AV block 03/31/2012   AV block, 1st degree 02/01/2012   PAF (paroxysmal atrial fibrillation) (HCC) 11/13/2011   Fatigue 11/13/2011   Hypertension 11/13/2011   Aortic stenosis 11/13/2011   Sleep apnea 11/13/2011    Orientation RESPIRATION BLADDER Height & Weight     Self, Situation, Time, Place  Normal Continent Weight: 180 lb (81.6 kg) Height:  5\' 11"  (180.3 cm)  BEHAVIORAL SYMPTOMS/MOOD NEUROLOGICAL BOWEL NUTRITION STATUS      Continent Diet  AMBULATORY STATUS COMMUNICATION OF NEEDS Skin   Extensive Assist   Normal                       Personal Care Assistance Level of Assistance  Bathing, Feeding, Dressing Bathing Assistance: Limited assistance Feeding assistance: Independent Dressing Assistance: Limited assistance     Functional Limitations Info  Sight, Hearing, Speech Sight Info: Adequate (wears glasses) Hearing Info: Adequate Speech Info: Adequate    SPECIAL CARE FACTORS FREQUENCY  PT (By  licensed PT), OT (By licensed OT)     PT Frequency: 5x per week OT Frequency: 5x per week            Contractures Contractures Info: Not present    Additional Factors Info  Code Status Code Status Info: full code             Current Medications (09/08/2022):  This is the current hospital active  medication list Current Facility-Administered Medications  Medication Dose Route Frequency Provider Last Rate Last Admin   0.9 %  sodium chloride infusion   Intravenous Continuous [name], MD   Paused at 09/04/22 1642   acetaminophen (TYLENOL) tablet 325-650 mg  325-650 mg Oral Q6H PRN [name], MD   650 mg at 09/05/22 0750   albuterol (PROVENTIL) (2.5 MG/3ML) 0.083% nebulizer solution 3 mL  3 mL Inhalation Q6H PRN [name], MD       alum & mag hydroxide-simeth (MAALOX/MYLANTA) 200-200-20 MG/5ML suspension 15-30 mL  15-30 mL Oral Q2H PRN [name], MD       amLODipine (NORVASC) tablet 5 mg  5 mg Oral q AM [name], MD   5 mg at 09/08/22 0930   apixaban (ELIQUIS) tablet 5 mg  5 mg Oral BID [name], MD   5 mg at 09/08/22 0930   ascorbic acid (VITAMIN C) tablet 1,000 mg  1,000 mg Oral Daily [name], MD   1,000 mg at 09/08/22 0930   bisacodyl (DULCOLAX) EC tablet 5 mg  5 mg Oral Daily PRN [name], MD       bisoprolol (ZEBETA) tablet 5 mg  5 mg Oral Daily [name], MD   5 mg at 09/08/22 0930   docusate sodium (COLACE) capsule 100 mg  100 mg Oral Daily [name], MD   100 mg at 09/08/22 0930   empagliflozin (JARDIANCE) tablet 10 mg  10 mg Oral Daily [name], MD   10 mg at 09/08/22 0930   umeclidinium bromide (INCRUSE ELLIPTA) 62.5 MCG/ACT 1 puff  1 puff Inhalation Daily [name], MD   1 puff at 09/08/22 0816   And   fluticasone furoate-vilanterol (BREO ELLIPTA) 100-25 MCG/ACT 1 puff  1 puff Inhalation Daily [name], MD   1 puff at 09/08/22 0816   gabapentin (NEURONTIN) capsule 600 mg  600 mg Oral Daily [name], MD   600 mg at 09/08/22 0930   And   gabapentin (NEURONTIN) capsule 1,200 mg  1,200 mg Oral QHS [name], MD   1,200 mg at 09/07/22 2203   gabapentin (NEURONTIN) capsule 600 mg  600 mg Oral Daily PRN [name], MD   600 mg at 09/04/22 1539   guaiFENesin-dextromethorphan (ROBITUSSIN DM) 100-10  MG/5ML syrup 15 mL  15 mL Oral Q4H PRN [name], MD       hydrALAZINE (APRESOLINE) injection 5 mg  5 mg Intravenous Q20 Min PRN [name], MD       HYDROmorphone (DILAUDID) injection 0.5-1 mg  0.5-1 mg Intravenous Q4H PRN [name], MD   1 mg at 09/07/22 1954   labetalol (NORMODYNE) injection 10 mg  10 mg Intravenous Q10 min PRN [name], MD       levothyroxine (SYNTHROID) tablet 175 mcg  175 mcg Oral QAC breakfast [name], MD   175 mcg at 09/08/22 0307   magnesium citrate solution 1  Bottle  1 Bottle Oral Once PRN [name], MD       magnesium sulfate IVPB 2 g 50 mL  2 g Intravenous Daily PRN [name], MD       metoprolol tartrate (LOPRESSOR) injection 2-5 mg  2-5 mg Intravenous Q2H PRN [name], MD       montelukast (SINGULAIR) tablet 10 mg  10 mg Oral QHS [name], MD   10 mg at 09/07/22 2204   nutrition supplement (JUVEN) (JUVEN) powder packet 1 packet  1 packet Oral BID BM [name], MD   1 packet at 09/08/22 1309   ondansetron (ZOFRAN) injection 4 mg  4 mg Intravenous Q6H PRN [name], MD       oxyCODONE (Oxy IR/ROXICODONE) immediate release tablet 10-15 mg  10-15 mg Oral Q4H PRN [name], MD   10 mg at 09/08/22 0930   oxyCODONE (Oxy IR/ROXICODONE) immediate release tablet 5-10 mg  5-10 mg Oral Q4H PRN [name], MD   5 mg at 09/07/22 2204   pantoprazole (PROTONIX) EC tablet 40 mg  40 mg Oral QAC breakfast [name], MD   40 mg at 09/08/22 0816   phenol (CHLORASEPTIC) mouth spray 1 spray  1 spray Mouth/Throat PRN [name], MD       polyethylene glycol (MIRALAX / GLYCOLAX) packet 17 g  17 g Oral Daily PRN [name], MD       potassium chloride SA (KLOR-CON M) CR tablet 20-40 mEq  20-40 mEq Oral Daily PRN [name], MD       predniSONE (DELTASONE) tablet 2.5 mg  2.5 mg Oral Q breakfast [name], MD   2.5 mg at 09/08/22 0816   rOPINIRole (REQUIP) tablet 1 mg  1 mg Oral TID [name], MD   1  mg at 09/08/22 0930   rosuvastatin (CRESTOR) tablet 10 mg  10 mg Oral QHS [name], MD   10 mg at 09/07/22 2203   spironolactone (ALDACTONE) tablet 25 mg  25 mg Oral Daily [name], MD   25 mg at 09/08/22 0930   torsemide (DEMADEX) tablet 30 mg  30 mg Oral Namon Cirri, MD   30 mg at 09/07/22 1610   traZODone (DESYREL) tablet 50 mg  50 mg Oral QHS [name], MD   50 mg at 09/07/22 2204   zinc sulfate capsule 220 mg  220 mg Oral Daily [name], MD   220 mg at 09/08/22 0930     Discharge Medications: Please see discharge summary for a list of discharge medications.  Relevant Imaging Results:  Relevant Lab Results:   Additional Information [phone]  Verna Czech [place], [place]

## 2022-09-08 NOTE — [place] FL2 (Deleted)
[place] MEDICAID FL2 LEVEL OF CARE FORM     IDENTIFICATION  Patient Name: [name] Birthdate: 07-19-1950 Sex: male Admission Date (Current Location): 09/04/2022  [facility] and [place] Number:  Producer, television/film/video and Address:  [facility], [address] [place], [place] [address]      Provider Number: [id number]  Attending Physician Name and Address:  [name], MD  Relative Name and Phone Number:  Ferrin, Creach (Spouse) [phone] (Mobile)    Current Level of Care: Hospital Recommended Level of Care: Skilled Nursing Facility Prior Approval Number:    Date Approved/Denied:   PASRR Number:    Discharge Plan: SNF    Current Diagnoses: Patient Active Problem List   Diagnosis Date Noted   Gangrene of right foot (HCC) 09/04/2022   S/P BKA (below knee amputation), right (HCC) 09/04/2022   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) 08/23/2022   CAP (community acquired pneumonia) 12/28/2021   Constipation 12/28/2021   Pain in both lower extremities 07/03/2021   Peripheral neuropathy 07/03/2021   Acute metabolic encephalopathy 06/09/2021   Marijuana dependence (HCC) 06/09/2021   DNR (do not resuscitate) 06/09/2021   Chest pain 06/08/2021   Renal lesion 02/14/2021   Status post cryoablation 02/14/2021   Allergic rhinitis 12/08/2020   Preop pulmonary/respiratory exam 03/27/2020   Acute on chronic diastolic (congestive) heart failure (HCC) 01/22/2020   Abdominal pain 01/22/2020   Hypothyroidism 12/13/2019   DM2 (diabetes mellitus, type 2) (HCC) 12/13/2019   Chronic kidney disease, stage 3b (HCC) 12/13/2019   Acute bacterial bronchitis 12/13/2019   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   Typical atrial flutter (HCC)    Chronic respiratory failure with hypoxia (HCC) 05/23/2016   CAD (coronary artery disease) 05/23/2016   Persistent atrial fibrillation (HCC)    Atypical atrial flutter (HCC)    Visit for monitoring Tikosyn  therapy 04/16/2016   Chronic diastolic CHF (congestive heart failure) (HCC) 02/03/2016   Cough 12/06/2015   Acute on chronic respiratory failure with hypoxia (HCC) 09/08/2015   Hallucinations 09/04/2015   Lymphadenopathy 09/04/2015   Ascending aortic aneurysm (HCC) 09/04/2015   Hypoxia 09/02/2015   S/P aortic valve replacement with bioprosthetic valve 09/02/2015   Pleural effusion 09/02/2015   S/P AVR 08/22/2015   GERD without esophagitis 09/20/2014   Intrinsic asthma 10/13/2013   Dyspnea 09/15/2013   Pulmonary hypertension (HCC) 09/15/2013   Shortness of breath 05/05/2013   Pacemaker-St.Jude 04/01/2012   Bradycardia 03/31/2012   Second degree AV block 03/31/2012   AV block, 1st degree 02/01/2012   PAF (paroxysmal atrial fibrillation) (HCC) 11/13/2011   Fatigue 11/13/2011   Hypertension 11/13/2011   Aortic stenosis 11/13/2011   Sleep apnea 11/13/2011    Orientation RESPIRATION BLADDER Height & Weight     Self, Situation, Time, Place  Normal Continent Weight: 180 lb (81.6 kg) Height:  5\' 11"  (180.3 cm)  BEHAVIORAL SYMPTOMS/MOOD NEUROLOGICAL BOWEL NUTRITION STATUS      Continent Diet  AMBULATORY STATUS COMMUNICATION OF NEEDS Skin   Extensive Assist   Normal                       Personal Care Assistance Level of Assistance  Bathing, Feeding, Dressing Bathing Assistance: Limited assistance Feeding assistance: Independent Dressing Assistance: Limited assistance     Functional Limitations Info  Sight, Hearing, Speech Sight Info: Adequate (wears glasses) Hearing Info: Adequate Speech Info: Adequate    SPECIAL CARE FACTORS FREQUENCY  PT (  By licensed PT), OT (By licensed OT)     PT Frequency: 5x per week OT Frequency: 5x per week            Contractures Contractures Info: Not present    Additional Factors Info  Code Status Code Status Info: full code             Current Medications (09/08/2022):  This is the current hospital active medication  list Current Facility-Administered Medications  Medication Dose Route Frequency Provider Last Rate Last Admin   0.9 %  sodium chloride infusion   Intravenous Continuous [name], MD   Paused at 09/04/22 1642   acetaminophen (TYLENOL) tablet 325-650 mg  325-650 mg Oral Q6H PRN [name], MD   650 mg at 09/05/22 0750   albuterol (PROVENTIL) (2.5 MG/3ML) 0.083% nebulizer solution 3 mL  3 mL Inhalation Q6H PRN [name], MD       alum & mag hydroxide-simeth (MAALOX/MYLANTA) 200-200-20 MG/5ML suspension 15-30 mL  15-30 mL Oral Q2H PRN [name], MD       amLODipine (NORVASC) tablet 5 mg  5 mg Oral q AM [name], MD   5 mg at 09/08/22 0930   apixaban (ELIQUIS) tablet 5 mg  5 mg Oral BID [name], MD   5 mg at 09/08/22 0930   ascorbic acid (VITAMIN C) tablet 1,000 mg  1,000 mg Oral Daily [name], MD   1,000 mg at 09/08/22 0930   bisacodyl (DULCOLAX) EC tablet 5 mg  5 mg Oral Daily PRN [name], MD       bisoprolol (ZEBETA) tablet 5 mg  5 mg Oral Daily [name], MD   5 mg at 09/08/22 0930   docusate sodium (COLACE) capsule 100 mg  100 mg Oral Daily [name], MD   100 mg at 09/08/22 0930   empagliflozin (JARDIANCE) tablet 10 mg  10 mg Oral Daily [name], MD   10 mg at 09/08/22 0930   umeclidinium bromide (INCRUSE ELLIPTA) 62.5 MCG/ACT 1 puff  1 puff Inhalation Daily [name], MD   1 puff at 09/08/22 0816   And   fluticasone furoate-vilanterol (BREO ELLIPTA) 100-25 MCG/ACT 1 puff  1 puff Inhalation Daily [name], MD   1 puff at 09/08/22 0816   gabapentin (NEURONTIN) capsule 600 mg  600 mg Oral Daily [name], MD   600 mg at 09/08/22 0930   And   gabapentin (NEURONTIN) capsule 1,200 mg  1,200 mg Oral QHS [name], MD   1,200 mg at 09/07/22 2203   gabapentin (NEURONTIN) capsule 600 mg  600 mg Oral Daily PRN [name], MD   600 mg at 09/04/22 1539   guaiFENesin-dextromethorphan (ROBITUSSIN DM) 100-10 MG/5ML syrup  15 mL  15 mL Oral Q4H PRN [name], MD       hydrALAZINE (APRESOLINE) injection 5 mg  5 mg Intravenous Q20 Min PRN [name], MD       HYDROmorphone (DILAUDID) injection 0.5-1 mg  0.5-1 mg Intravenous Q4H PRN [name], MD   1 mg at 09/07/22 1954   labetalol (NORMODYNE) injection 10 mg  10 mg Intravenous Q10 min PRN [name], MD       levothyroxine (SYNTHROID) tablet 175 mcg  175 mcg Oral QAC breakfast [name], MD   175 mcg at 09/08/22 0307   magnesium citrate solution  1 Bottle  1 Bottle Oral Once PRN [name], MD       magnesium sulfate IVPB 2 g 50 mL  2 g Intravenous Daily PRN [name], MD       metoprolol tartrate (LOPRESSOR) injection 2-5 mg  2-5 mg Intravenous Q2H PRN [name], MD       montelukast (SINGULAIR) tablet 10 mg  10 mg Oral QHS [name], MD   10 mg at 09/07/22 2204   nutrition supplement (JUVEN) (JUVEN) powder packet 1 packet  1 packet Oral BID BM [name], MD   1 packet at 09/08/22 0930   ondansetron (ZOFRAN) injection 4 mg  4 mg Intravenous Q6H PRN [name], MD       oxyCODONE (Oxy IR/ROXICODONE) immediate release tablet 10-15 mg  10-15 mg Oral Q4H PRN [name], MD   10 mg at 09/08/22 0930   oxyCODONE (Oxy IR/ROXICODONE) immediate release tablet 5-10 mg  5-10 mg Oral Q4H PRN [name], MD   5 mg at 09/07/22 2204   pantoprazole (PROTONIX) EC tablet 40 mg  40 mg Oral QAC breakfast [name], MD   40 mg at 09/08/22 0816   phenol (CHLORASEPTIC) mouth spray 1 spray  1 spray Mouth/Throat PRN [name], MD       polyethylene glycol (MIRALAX / GLYCOLAX) packet 17 g  17 g Oral Daily PRN [name], MD       potassium chloride SA (KLOR-CON M) CR tablet 20-40 mEq  20-40 mEq Oral Daily PRN [name], MD       predniSONE (DELTASONE) tablet 2.5 mg  2.5 mg Oral Q breakfast [name], MD   2.5 mg at 09/08/22 0816   rOPINIRole (REQUIP) tablet 1 mg  1 mg Oral TID [name], MD   1 mg at 09/08/22  0930   rosuvastatin (CRESTOR) tablet 10 mg  10 mg Oral QHS [name], MD   10 mg at 09/07/22 2203   spironolactone (ALDACTONE) tablet 25 mg  25 mg Oral Daily [name], MD   25 mg at 09/08/22 0930   torsemide (DEMADEX) tablet 30 mg  30 mg Oral Namon Cirri, MD   30 mg at 09/07/22 8119   traZODone (DESYREL) tablet 50 mg  50 mg Oral QHS [name], MD   50 mg at 09/07/22 2204   zinc sulfate capsule 220 mg  220 mg Oral Daily [name], MD   220 mg at 09/08/22 0930     Discharge Medications: Please see discharge summary for a list of discharge medications.  Relevant Imaging Results:  Relevant Lab Results:   Additional Information [phone]  Verna Czech [place], [place]

## 2022-09-08 NOTE — Progress Notes (Signed)
Patient ID: [name], male   DOB: 1950-10-23, 72 y.o.   MRN: [id number] Status post transtibial amputation with total of 50 cc in the wound VAC canister no additional drainage.  Working with PT for additional improvement to eventually DC home.

## 2022-09-08 NOTE — TOC Progression Note (Signed)
Transition of Care [facility]) - Progression Note    Patient Details  Name: [name] MRN: [id number] Date of Birth: Sep 04, 1950  Transition of Care [facility]) CM/SW Contact  [address], [place], [place] Phone Number: 09/08/2022, 1:03 PM  Clinical Narrative:    This Child psychotherapist met with patient at bedside. He is now agreeable to SNF rehab. SNF process discussed, FL2 faxed out. Patient will be provided with bed offers once received.  Chey Rachels, LCSW Transition of Care    Expected Discharge Plan: Home w Home Health Services Barriers to Discharge: Continued Medical Work up  Expected Discharge Plan and Services                                   HH Arranged: PT, OT   Date [facility] Agency Contacted: 09/05/22 Time HH Agency Contacted: 1513 Representative spoke with at [facility] Agency: Tresa Endo   Social Determinants of Health (SDOH) Interventions SDOH Screenings   Food Insecurity: No Food Insecurity (09/05/2022)  Housing: Low Risk  (09/05/2022)  Transportation Needs: No Transportation Needs (09/05/2022)  Utilities: Not At Risk (09/05/2022)  Tobacco Use: Medium Risk (09/05/2022)    Readmission Risk Interventions    01/01/2022   10:54 AM  Readmission Risk Prevention Plan  Transportation Screening Complete  PCP or Specialist Appt within 3-5 Days Complete  HRI or Home Care Consult Complete  Social Work Consult for Recovery Care Planning/Counseling Complete  Palliative Care Screening Not Applicable  Medication Review Oceanographer) Complete

## 2022-09-09 NOTE — Care Management Important Message (Signed)
Important Message  Patient Details  Name: MARTAE SAMUELSEN MRN: [id number] Date of Birth: [birth year]   Medicare Important Message Given:  Yes     Sherilyn Banker 09/09/2022, 1:09 PM

## 2022-09-09 NOTE — TOC Progression Note (Signed)
Transition of Care [facility]) - Progression Note    Patient Details  Name: [name] MRN: [id number] Date of Birth: 17-Dec-1950  Transition of Care [facility]) CM/SW Contact  Lorri Frederick, LCSW Phone Number: 09/09/2022, 11:50 AM  Clinical Narrative:   CSW reached out to [facility] and asked them to review referral.  TC from pt wife asking about [facility].   1145: [facility] cannot offer bed.  CSW spoke with wife by phone, provided all bed offers, she would like to accept offer at [facility]. Nikki[facility] informed.     Expected Discharge Plan: Home w Home Health Services Barriers to Discharge: Continued Medical Work up  Expected Discharge Plan and Services                                   HH Arranged: PT, OT   Date [facility] Agency Contacted: 09/05/22 Time HH Agency Contacted: 1513 Representative spoke with at [facility] Agency: Tresa Endo   Social Determinants of Health (SDOH) Interventions SDOH Screenings   Food Insecurity: No Food Insecurity (09/05/2022)  Housing: Low Risk  (09/05/2022)  Transportation Needs: No Transportation Needs (09/05/2022)  Utilities: Not At Risk (09/05/2022)  Tobacco Use: Medium Risk (09/05/2022)    Readmission Risk Interventions    01/01/2022   10:54 AM  Readmission Risk Prevention Plan  Transportation Screening Complete  PCP or Specialist Appt within 3-5 Days Complete  HRI or Home Care Consult Complete  Social Work Consult for Recovery Care Planning/Counseling Complete  Palliative Care Screening Not Applicable  Medication Review Oceanographer) Complete

## 2022-09-09 NOTE — Progress Notes (Signed)
Patient ID: [name], male   DOB: 11/20/1950, 72 y.o.   MRN: [id number] Patient is status post right below-knee amputation.  No new drainage in the wound VAC canister at 50 cc.  Patient states [facility].  Will plan to discontinue the wound VAC at discharge.

## 2022-09-09 NOTE — Progress Notes (Signed)
Physical Therapy Treatment Patient Details Name: [name] MRN: [id number] DOB: 11/04/1950 Today's Date: 09/09/2022   History of Present Illness 72 yo male s/p R BKA 09/04/22 PMH atrial fibrillation COPD, CHF Fibromyalgia HTN, HLD, pacemaker, Aortic valve replacement back surgery x3 remote smoker    PT Comments    Pt was seen for gait and mobility of balance and management of limb protector.  He is up to stand with minimal help, balancing with use of RW with no more than min assist, with one posterior LOB that was easily controlled.  Follow along with him to prepare for rehab and get him stable in standing as he progresses toward a prosthesis on RLE as healing permits.  Follow goals of PT as outlined on POC.   Recommendations for follow up therapy are one component of a multi-disciplinary discharge planning process, led by the attending physician.  Recommendations may be updated based on patient status, additional functional criteria and insurance authorization.  Follow Up Recommendations  Can patient physically be transported by private vehicle: No    Assistance Recommended at Discharge Intermittent Supervision/Assistance  Patient can return home with the following A little help with walking and/or transfers   Equipment Recommendations  Rolling walker (2 wheels);Wheelchair (measurements PT);Wheelchair cushion (measurements PT);BSC/3in1    Recommendations for Other Services       Precautions / Restrictions Precautions Precautions: Fall Precaution Comments: residual limb protector, wound vac Restrictions Weight Bearing Restrictions: Yes RLE Weight Bearing: Non weight bearing     Mobility  Bed Mobility Overal bed mobility: Needs Assistance Bed Mobility: Sit to Supine       Sit to supine: Min assist   General bed mobility comments: additional help for repositioning legs    Transfers Overall transfer level: Needs assistance Equipment used: Rolling walker (2  wheels) Transfers: Sit to/from Stand Sit to Stand: Min guard                Ambulation/Gait Ambulation/Gait assistance: Min guard, Min assist Gait Distance (Feet): 28 Feet Assistive device: Rolling walker (2 wheels)   Gait velocity: reduced Gait velocity interpretation: <1.31 ft/sec, indicative of household ambulator Pre-gait activities: standing balance ck General Gait Details: pt is using LLE in a nearly heel to toe pattern   Stairs             Wheelchair Mobility    Modified Rankin (Stroke Patients Only)       Balance Overall balance assessment: Needs assistance Sitting-balance support: Single extremity supported Sitting balance-Leahy Scale: Good     Standing balance support: Bilateral upper extremity supported, During functional activity Standing balance-Leahy Scale: Poor                              Cognition Arousal/Alertness: Awake/alert Behavior During Therapy: WFL for tasks assessed/performed Overall Cognitive Status: Within Functional Limits for tasks assessed                                          Exercises      General Comments General comments (skin integrity, edema, etc.): Pt is fairly comfortable, motivated to walk and did attend to moving RLE well given his recent surgery.  Aware of maintaining posture of RLE to lower legrest      Pertinent Vitals/Pain Pain Assessment Pain Assessment: Faces Faces Pain Scale: Hurts little more  Pain Location: R leg surgery site but not phantom pain Pain Descriptors / Indicators: Guarding Pain Intervention(s): Limited activity within patient's tolerance, Monitored during session, Premedicated before session, Repositioned    Home Living                          Prior Function            PT Goals (current goals can now be found in the care plan section) Acute Rehab PT Goals Patient Stated Goal: HOME INDEPENDENT Progress towards PT goals: Progressing  toward goals    Frequency    Min 4X/week      PT Plan Current plan remains appropriate    Co-evaluation              AM-PAC PT "6 Clicks" Mobility   Outcome Measure  Help needed turning from your back to your side while in a flat bed without using bedrails?: A Little Help needed moving from lying on your back to sitting on the side of a flat bed without using bedrails?: A Little Help needed moving to and from a bed to a chair (including a wheelchair)?: A Little Help needed standing up from a chair using your arms (e.g., wheelchair or bedside chair)?: A Little Help needed to walk in hospital room?: A Little Help needed climbing 3-5 steps with a railing? : Total 6 Click Score: 16    End of Session Equipment Utilized During Treatment: Gait belt Activity Tolerance: Patient tolerated treatment well Patient left: with call bell/phone within reach;in bed;with bed alarm set Nurse Communication: Mobility status PT Visit Diagnosis: Other abnormalities of gait and mobility (R26.89);Pain Pain - Right/Left: Right Pain - part of body: Leg     Time: 1610-9604 PT Time Calculation (min) (ACUTE ONLY): 26 min  Charges:  $Gait Training: 8-22 mins $Therapeutic Activity: 8-22 mins              Ivar Drape 09/09/2022, 4:15 PM  Samul Dada, PT PhD Acute Rehab Dept. Number: [facility] [id number] and [facility] [phone]

## 2022-09-09 NOTE — Progress Notes (Signed)
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  Pt continues to be most limited by pain.  Pt with pain in posterior R leg area.  Small open area from limb protector rubbing on skin.  This area shown to nursing and dressed with relief.  Pt would benefit from less than 3 hours of therapy a day to increase independence before returning home.    Recommendations for follow up therapy are one component of a multi-disciplinary discharge planning process, led by the attending physician.  Recommendations may be updated based on patient status, additional functional criteria and insurance authorization.    Assistance Recommended at Discharge Intermittent Supervision/Assistance  Patient can return home with the following  A little help with walking and/or transfers;A little help with bathing/dressing/bathroom;Assist for transportation   Equipment Recommendations  BSC/3in1;Wheelchair (measurements OT);Wheelchair cushion (measurements OT);Other (comment)    Recommendations for Other Services      Precautions / Restrictions Precautions Precautions: Fall Precaution Comments: residual limb protector, wound vac Restrictions Weight Bearing Restrictions: Yes RLE Weight Bearing: Non weight bearing       Mobility Bed Mobility Overal bed mobility: Needs Assistance Bed Mobility: Supine to Sit     Supine to sit: Supervision     General bed mobility comments: increased time and use of bedrail    Transfers Overall transfer level: Needs assistance Equipment used: Rolling walker (2 wheels) Transfers: Sit to/from Stand, Bed to chair/wheelchair/BSC Sit to Stand: Min guard      Step pivot transfers: Min guard     General transfer comment: Repeated sit to stand from lower surfaced and changed where to put hand on walker so walker does not tip when pushing up with one hand on walker.     Balance Overall balance assessment: Needs assistance Sitting-balance support: Bilateral upper extremity supported, Single extremity supported, No upper extremity supported Sitting balance-Leahy Scale: Fair     Standing balance support: Bilateral upper extremity supported Standing balance-Leahy Scale: Poor Standing balance comment: with RW support                           ADL either performed or assessed with clinical judgement   ADL Overall ADL's : Needs assistance/impaired Eating/Feeding: Independent           Lower Body Bathing: Minimal assistance;Sit to/from stand;Cueing for compensatory techniques       Lower Body Dressing: Moderate assistance;Sit to/from stand;Cueing for compensatory techniques Lower Body Dressing Details (indicate cue type and reason): Pt donned L sock in chair without assist. Pt donned limb protector x2 without assist.  Straps shortened due them sticking to each other.  Pt educated to not make straps too tight. Toilet Transfer: Minimal assistance;BSC/3in1;Rolling walker (2 wheels);Stand-pivot Statistician Details (indicate cue type and reason): Pt much safer with transfers today         Functional mobility during ADLs: Minimal assistance;Rolling walker (2 wheels) General ADL Comments: Pt educated on limb protector and making sure to not have it too tight.  Pt with open blister from protector rubbing on back of R thiagh.  Padded dressing put on back of thiagh to protect wound and give relief.  Nursing notifed.    Extremity/Trunk Assessment Upper Extremity Assessment Upper Extremity Assessment: Overall WFL for tasks assessed   Lower Extremity Assessment Lower Extremity Assessment: Defer to PT evaluation        Vision    Vision Assessment?: No apparent visual deficits Additional Comments: wears glasses   Perception Perception Perception: Within Functional Limits   Praxis Praxis Praxis: Intact    Cognition Arousal/Alertness: Awake/alert Behavior During Therapy: WFL for tasks assessed/performed Overall Cognitive Status: Within Functional Limits for tasks assessed                                 General Comments: pt A&Ox4 and able to follow all commands        Exercises      Shoulder Instructions       General Comments Pt most limited by pain this session. Pt has progressed well with adl mobility and adls. Pt in a lot of pain after session.  Protector was loosned and nursing aware.    Pertinent Vitals/ Pain       Pain Assessment Pain Assessment: 0-10 Pain Score: 8  Pain Location: distal RLE Pain Descriptors / Indicators: Grimacing, Guarding Pain Intervention(s): Limited activity within patient's tolerance, Premedicated before session, Monitored during session, Repositioned  Home Living                                          Prior Functioning/Environment              Frequency  Min 2X/week        Progress Toward Goals  OT Goals(current goals can now be found in the care plan section)  Progress towards OT goals: Progressing toward goals  Acute Rehab OT Goals Patient Stated Goal: to get more independent before going home OT Goal Formulation: With patient/family Time For Goal Achievement: 09/18/22 Potential to Achieve Goals: Good ADL Goals Pt Will Perform Lower Body Dressing: with min guard assist;with adaptive equipment;sit to/from stand Pt Will Transfer to Toilet: with min guard assist;stand pivot transfer;bedside commode Additional ADL Goal #1: pt will complete w/c transfer mod I Additional ADL Goal #2: pt and wife will don doff residual limb protector mod I  Plan      Co-evaluation                 AM-PAC OT "6 Clicks"  Daily Activity     Outcome Measure   Help from another person eating meals?: None Help from another person taking care of personal grooming?: None Help from another person toileting, which includes using toliet, bedpan, or urinal?: A Little Help from another person bathing (including washing, rinsing, drying)?: A Little Help from another person to put on and taking off regular upper body clothing?: None Help from another person to put on and taking off regular lower body clothing?: A Little 6 Click Score: 21    End of Session Equipment Utilized During Treatment: Gait belt;Rolling walker (2 wheels)  OT Visit Diagnosis: Unsteadiness on feet (R26.81);Muscle weakness (generalized) (M62.81)   Activity Tolerance Patient tolerated treatment well   Patient Left in chair;with call bell/phone within reach;with chair alarm set   Nurse Communication Mobility status;Patient requests pain meds        Time: 1610-9604 OT Time Calculation (min): 24 min  Charges: OT General Charges $OT Visit:  1 Visit OT Treatments $Self Care/Home Management : 23-37 mins   Hope Budds 09/09/2022, 9:38 AM

## 2022-09-10 ENCOUNTER — Telehealth: Payer: Self-pay | Admitting: *Deleted

## 2022-09-10 ENCOUNTER — Ambulatory Visit: Payer: Medicare Other | Admitting: Student

## 2022-09-10 MED ORDER — OXYCODONE-ACETAMINOPHEN 5-325 MG PO TABS: 1.0000 | ORAL_TABLET | ORAL | 0 refills | Status: DC | PRN

## 2022-09-10 NOTE — TOC Transition Note (Signed)
Transition of Care [facility]) - CM/SW Discharge Note   Patient Details  Name: [name] MRN: [id number] Date of Birth: 26-May-1950  Transition of Care [facility]) CM/SW Contact:  Lorri Frederick, LCSW Phone Number: 09/10/2022, 9:53 AM   Clinical Narrative:  Pt discharging to [facility], room 110.  RN call report to [phone].     Final next level of care: Skilled Nursing Facility Barriers to Discharge: Barriers Resolved   Patient Goals and CMS Choice      Discharge Placement                Patient chooses bed at: [facility] Patient to be transferred to facility by: [facility] Name of family member notified: wife [name] Patient and family notified of of transfer: 09/10/22  Discharge Plan and Services Additional resources added to the After Visit Summary for                            [facility] Arranged: PT, OT   Date [facility] Agency Contacted: 09/05/22 Time HH Agency Contacted: 1513 Representative spoke with at [facility] Agency: Tresa Endo  Social Determinants of Health (SDOH) Interventions SDOH Screenings   Food Insecurity: No Food Insecurity (09/05/2022)  Housing: Low Risk  (09/05/2022)  Transportation Needs: No Transportation Needs (09/05/2022)  Utilities: Not At Risk (09/05/2022)  Tobacco Use: Medium Risk (09/05/2022)     Readmission Risk Interventions    01/01/2022   10:54 AM  Readmission Risk Prevention Plan  Transportation Screening Complete  PCP or Specialist Appt within 3-5 Days Complete  HRI or Home Care Consult Complete  Social Work Consult for Recovery Care Planning/Counseling Complete  Palliative Care Screening Not Applicable  Medication Review Oceanographer) Complete

## 2022-09-10 NOTE — TOC Progression Note (Addendum)
Transition of Care [facility]) - Progression Note    Patient Details  Name: [name] MRN: [id number] Date of Birth: [birth year]  Transition of Care [facility]) CM/SW Contact  Lorri Frederick, LCSW Phone Number: 09/10/2022, 8:30 AM  Clinical Narrative:   SNF auth approved in [place]: [id number], [id number], 3 days: 5/14-5/16.  CSW confirmed with [facility] they are ready to receive pt today.    Expected Discharge Plan: Home w Home Health Services Barriers to Discharge: Continued Medical Work up  Expected Discharge Plan and Services         Expected Discharge Date: 09/10/22                         HH Arranged: PT, OT   Date HH Agency Contacted: 09/05/22 Time HH Agency Contacted: 1513 Representative spoke with at [facility] Agency: Tresa Endo   Social Determinants of Health (SDOH) Interventions SDOH Screenings   Food Insecurity: No Food Insecurity (09/05/2022)  Housing: Low Risk  (09/05/2022)  Transportation Needs: No Transportation Needs (09/05/2022)  Utilities: Not At Risk (09/05/2022)  Tobacco Use: Medium Risk (09/05/2022)    Readmission Risk Interventions    01/01/2022   10:54 AM  Readmission Risk Prevention Plan  Transportation Screening Complete  PCP or Specialist Appt within 3-5 Days Complete  HRI or Home Care Consult Complete  Social Work Consult for Recovery Care Planning/Counseling Complete  Palliative Care Screening Not Applicable  Medication Review Oceanographer) Complete

## 2022-09-10 NOTE — Progress Notes (Signed)
Patient ID: [name], male   DOB: 15-Oct-1950, 72 y.o.   MRN: [id number] Patient is status post transtibial amputation.  There is no new drainage in the wound VAC canister.  The seal is not sufficient to discharge with the [facility] and will discontinue the wound VAC and discharged with dry dressing today to [facility].

## 2022-09-10 NOTE — Discharge Summary (Signed)
Discharge Diagnoses:  Principal Problem:   S/P BKA (below knee amputation), right (HCC) Active Problems:   Gangrene of right foot (HCC)   Surgeries: Procedure(s): RIGHT BELOW KNEE AMPUTATION on 09/04/2022    Consultants:   Discharged Condition: Improved  Hospital Course: [name] is an 72 y.o. male who was admitted 09/04/2022 with a chief complaint of gangrene right foot, with a final diagnosis of Gangrene Right Foot.  Patient was brought to the operating room on 09/04/2022 and underwent Procedure(s): RIGHT BELOW KNEE AMPUTATION.    Patient was given perioperative antibiotics:  Anti-infectives (From admission, onward)    Start     Dose/Rate Route Frequency Ordered Stop   09/04/22 1800  ceFAZolin (ANCEF) IVPB 2g/100 mL premix        2 g 200 mL/hr over 30 Minutes Intravenous Every 8 hours 09/04/22 1143 09/05/22 0330   09/04/22 0815  ceFAZolin (ANCEF) IVPB 2g/100 mL premix        2 g 200 mL/hr over 30 Minutes Intravenous On call to O.R. 09/04/22 1610 09/04/22 0955     .  Patient was given sequential compression devices, early ambulation, and aspirin for DVT prophylaxis.  Recent vital signs: Patient Vitals for the past 24 hrs:  BP Temp Temp src Pulse Resp SpO2  09/10/22 0411 137/66 97.9 F (36.6 C) -- 66 20 95 %  09/09/22 1928 108/65 98 F (36.7 C) -- 65 18 100 %  09/09/22 1415 109/68 98 F (36.7 C) Oral 69 18 100 %  09/09/22 0737 -- -- -- -- -- 98 %  09/09/22 0723 108/62 98 F (36.7 C) Oral 64 18 95 %  .  Recent laboratory studies: No results found.  Discharge Medications:   Allergies as of 09/10/2022       Reactions   Meloxicam Rash, Other (See Comments)   Red Man Syndrome, also   Vancomycin Other (See Comments)   Red Man's syndrome 09/02/15, resolved with diphenhydramine and slowing of rate        Medication List     TAKE these medications    albuterol 108 (90 Base) MCG/ACT inhaler Commonly known as: VENTOLIN HFA Inhale 2 puffs into the lungs every 6  (six) hours as needed for wheezing or shortness of breath.   amLODipine 10 MG tablet Commonly known as: NORVASC Take 5 mg by mouth in the morning.   apixaban 5 MG Tabs tablet Commonly known as: ELIQUIS Take 1 tablet (5 mg total) by mouth 2 (two) times daily.   bisoprolol 5 MG tablet Commonly known as: ZEBETA Take 1 tablet (5 mg total) by mouth daily. Needs appt   Breztri Aerosphere 160-9-4.8 MCG/ACT Aero Generic drug: Budeson-Glycopyrrol-Formoterol Inhale 2 puffs into the lungs in the morning and at bedtime.   CAL-MAG-ZINC PO Take 1 tablet by mouth at bedtime.   dextromethorphan-guaiFENesin 30-600 MG 12hr tablet Commonly known as: MUCINEX DM Take 1 tablet by mouth 2 (two) times daily.   empagliflozin 10 MG Tabs tablet Commonly known as: Jardiance Take 1 tablet (10 mg total) by mouth daily.   gabapentin 300 MG capsule Commonly known as: NEURONTIN Take 600-1,200 mg by mouth See admin instructions. Take 600 mg by mouth in the morning, 1,200 mg at bedtime, and an additional 600 mg once a day as needed for neuropathy   levothyroxine 175 MCG tablet Commonly known as: SYNTHROID Take 175 mcg by mouth daily before breakfast.   montelukast 10 MG tablet Commonly known as: SINGULAIR TAKE 1 TABLET(10 MG) BY  MOUTH AT BEDTIME What changed: See the new instructions.   multivitamin with minerals Tabs tablet Take 1 tablet by mouth daily.   ondansetron 4 MG tablet Commonly known as: Zofran Take 1 tablet (4 mg total) by mouth every 8 (eight) hours as needed for nausea or vomiting.   oxyCODONE-acetaminophen 5-325 MG tablet Commonly known as: PERCOCET/ROXICET Take 1 tablet by mouth every 4 (four) hours as needed. What changed: when to take this   OXYGEN Inhale 2 L/min into the lungs See admin instructions. 2 L/min at bedtime and 2 L/min throughout the day as needed for shortness of breath   pantoprazole 40 MG tablet Commonly known as: PROTONIX Take 40 mg by mouth daily before  breakfast.   polyethylene glycol powder 17 GM/SCOOP powder Commonly known as: GLYCOLAX/MIRALAX Take 17 g by mouth daily as needed for mild constipation.   potassium chloride 10 MEQ tablet Commonly known as: KLOR-CON M Take 20 mEq by mouth in the morning and at bedtime. Take along with Torsemide (30 mg)   predniSONE 5 MG tablet Commonly known as: DELTASONE Take 0.5 tablets (2.5 mg total) by mouth daily with breakfast.   rOPINIRole 1 MG tablet Commonly known as: REQUIP Take 1 tablet (1 mg total) by mouth in the morning and at bedtime.   rosuvastatin 10 MG tablet Commonly known as: CRESTOR Take 10 mg by mouth at bedtime.   senna 8.6 MG Tabs tablet Commonly known as: SENOKOT Take 1 tablet (8.6 mg total) by mouth 2 (two) times daily.   spironolactone 25 MG tablet Commonly known as: ALDACTONE Take 1 tablet (25 mg total) by mouth daily.   sucralfate 1 g tablet Commonly known as: CARAFATE Take 1 g by mouth 3 (three) times daily.   TIGER BALM ARTHRITIS RUB EX Apply 1 application topically as needed (for leg pain).   torsemide 20 MG tablet Commonly known as: DEMADEX Take 30 mg by mouth every other day. Take along with Potassium (10 meq)   traZODone 50 MG tablet Commonly known as: DESYREL Take 50 mg by mouth at bedtime.   Vitamin D3 Super Strength 50 MCG (2000 UT) Caps Generic drug: Cholecalciferol Take 8,000 Units by mouth at bedtime.               Durable Medical Equipment  (From admission, onward)           Start     Ordered   09/05/22 1525  For home use only DME Bedside commode  Once       Comments: Confine to one room  Question:  Patient needs a bedside commode to treat with the following condition  Answer:  S/P BKA (below knee amputation), right (HCC)   09/05/22 1526   09/05/22 1525  For home use only DME lightweight manual wheelchair with seat cushion  Once       Comments: Patient suffers from BKA which impairs their ability to perform daily  activities like bathing and dressing in the home.  A walker will not resolve  issue with performing activities of daily living. A wheelchair will allow patient to safely perform daily activities. Patient is not able to propel themselves in the home using a standard weight wheelchair due to general weakness. Patient can self propel in the lightweight wheelchair. Length of need Lifetime. Accessories: elevating leg rests (ELRs), wheel locks, extensions and anti-tippers.   09/05/22 1526   09/05/22 1524  For home use only DME Walker rolling  Once  Question Answer Comment  Walker: With 5 Inch Wheels   Patient needs a walker to treat with the following condition S/P BKA (below knee amputation), right (HCC)      09/05/22 1526              Discharge Care Instructions  (From admission, onward)           Start     Ordered   09/10/22 0000  Change dressing       Comments: Provide dressing changes right below-knee amputation change daily   09/10/22 0719            Diagnostic Studies: No results found.  Patient benefited maximally from their hospital stay and there were no complications.     Disposition: Discharge disposition: [facility]-Rehab Facility      Discharge Instructions     Call MD / Call 911   Complete by: As directed    If you experience chest pain or shortness of breath, CALL 911 and be transported to the hospital emergency room.  If you develope a fever above 101 F, pus (white drainage) or increased drainage or redness at the wound, or calf pain, call your surgeon's office.   Change dressing   Complete by: As directed    Provide dressing changes right below-knee amputation change daily   Constipation Prevention   Complete by: As directed    Drink plenty of fluids.  Prune juice may be helpful.  You may use a stool softener, such as Colace (over the counter) 100 mg twice a day.  Use MiraLax (over the counter) for constipation as needed.   Diet - low sodium heart  healthy   Complete by: As directed    Increase activity slowly as tolerated   Complete by: As directed    Post-operative opioid taper instructions:   Complete by: As directed    POST-OPERATIVE OPIOID TAPER INSTRUCTIONS: It is important to wean off of your opioid medication as soon as possible. If you do not need pain medication after your surgery it is ok to stop day one. Opioids include: Codeine, Hydrocodone(Norco, Vicodin), Oxycodone(Percocet, oxycontin) and hydromorphone amongst others.  Long term and even short term use of opiods can cause: Increased pain response Dependence Constipation Depression Respiratory depression And more.  Withdrawal symptoms can include Flu like symptoms Nausea, vomiting And more Techniques to manage these symptoms Hydrate well Eat regular healthy meals Stay active Use relaxation techniques(deep breathing, meditating, yoga) Do Not substitute Alcohol to help with tapering If you have been on opioids for less than two weeks and do not have pain than it is ok to stop all together.  Plan to wean off of opioids This plan should start within one week post op of your joint replacement. Maintain the same interval or time between taking each dose and first decrease the dose.  Cut the total daily intake of opioids by one tablet each day Next start to increase the time between doses. The last dose that should be eliminated is the evening dose.          Follow-up Information     [name], MD Follow up in 1 week(s).   Specialty: Orthopedic Surgery Contact information: [address] [place] [address] [phone]         Alysia Penna, MD Follow up.   Specialty: Internal Medicine Contact information: [address] [place] [address] [phone]  Signed: [name] 09/10/2022, 7:20 AM

## 2022-09-10 NOTE — Telephone Encounter (Signed)
Lyla Son with [facility] wound care nurse called stating she was given a report that the dressing needs to remain intact - she is wanting a call back for confirmation on wound care instructions  [phone]

## 2022-09-10 NOTE — Progress Notes (Signed)
Patient alert and oriented, discharge teaching and IV access removed by SWAT RN. Report called to [place] at [facility]. Patient will transport to [facility] via [facility].

## 2022-09-10 NOTE — Progress Notes (Signed)
Physical Therapy Treatment Patient Details Name: [name] MRN: [id number] DOB: [birth year] Today's Date: 09/10/2022   History of Present Illness 72 yo male s/p R BKA 09/04/22 PMH atrial fibrillation COPD, CHF Fibromyalgia HTN, HLD, pacemaker, Aortic valve replacement back surgery x3 remote smoker    PT Comments    Pt received sitting EOB and agreeable to session. Pt limited this session by increased RLE pain from recent dressing change. Pt able to tolerate short gait trial in room demonstrating a steady hop-to pattern with slightly increased unsteadiness during turns. Pt limited by BUE fatigue and decreased activity tolerance. Pt deferring further ambulation and standing trials due to pain. Pt continues to benefit from PT services to progress toward functional mobility goals.    Recommendations for follow up therapy are one component of a multi-disciplinary discharge planning process, led by the attending physician.  Recommendations may be updated based on patient status, additional functional criteria and insurance authorization.     Assistance Recommended at Discharge Intermittent Supervision/Assistance  Patient can return home with the following     Equipment Recommendations  Rolling walker (2 wheels);Wheelchair (measurements PT);Wheelchair cushion (measurements PT);BSC/3in1    Recommendations for Other Services       Precautions / Restrictions Precautions Precautions: Fall Precaution Comments: residual limb protector Restrictions Weight Bearing Restrictions: Yes RLE Weight Bearing: Non weight bearing     Mobility  Bed Mobility Overal bed mobility: Modified Independent             General bed mobility comments: Pt sitting EOB upon arrival    Transfers Overall transfer level: Needs assistance Equipment used: Rolling walker (2 wheels) Transfers: Sit to/from Stand Sit to Stand: Min guard, From elevated surface           General transfer comment: From  elevated EOB with min guard for safety    Ambulation/Gait Ambulation/Gait assistance: Min guard Gait Distance (Feet): 15 Feet Assistive device: Rolling walker (2 wheels) Gait Pattern/deviations:  (hop-to pattern) Gait velocity: reduced     General Gait Details: Pt demonstrating slow, steady hop-to pattern with cues to not lift RW during turns. Cues for RW proximity.       Balance Overall balance assessment: Needs assistance Sitting-balance support: Single extremity supported Sitting balance-Leahy Scale: Good Sitting balance - Comments: sitting EOB   Standing balance support: Bilateral upper extremity supported, During functional activity, Reliant on assistive device for balance Standing balance-Leahy Scale: Poor Standing balance comment: with RW support                            Cognition Arousal/Alertness: Awake/alert Behavior During Therapy: WFL for tasks assessed/performed Overall Cognitive Status: Within Functional Limits for tasks assessed                                          Exercises      General Comments        Pertinent Vitals/Pain Pain Assessment Pain Assessment: 0-10 Pain Score: 8  Pain Location: R residual limb Pain Descriptors / Indicators: Guarding, Grimacing Pain Intervention(s): Monitored during session, Repositioned     PT Goals (current goals can now be found in the care plan section) Acute Rehab PT Goals Patient Stated Goal: HOME INDEPENDENT PT Goal Formulation: With patient Time For Goal Achievement: 09/18/22 Potential to Achieve Goals: Good Progress towards PT goals: Progressing toward goals  Frequency    Min 4X/week      PT Plan Current plan remains appropriate       AM-PAC PT "6 Clicks" Mobility   Outcome Measure  Help needed turning from your back to your side while in a flat bed without using bedrails?: None Help needed moving from lying on your back to sitting on the side of a flat  bed without using bedrails?: None Help needed moving to and from a bed to a chair (including a wheelchair)?: A Little Help needed standing up from a chair using your arms (e.g., wheelchair or bedside chair)?: A Little Help needed to walk in hospital room?: A Little Help needed climbing 3-5 steps with a railing? : Total 6 Click Score: 18    End of Session Equipment Utilized During Treatment: Gait belt Activity Tolerance: Patient limited by pain Patient left: with call bell/phone within reach;in chair;with chair alarm set Nurse Communication: Mobility status PT Visit Diagnosis: Other abnormalities of gait and mobility (R26.89);Pain Pain - Right/Left: Right Pain - part of body: Leg     Time: 0953-1001 PT Time Calculation (min) (ACUTE ONLY): 8 min  Charges:  $Gait Training: 8-22 mins                     Johny Shock, PTA Acute Rehabilitation Services Secure Chat Preferred  [phone]    Johny Shock 09/10/2022, 10:07 AM

## 2022-09-10 NOTE — Telephone Encounter (Signed)
Called and advised that the pt had his wound vac d/c prior to leaving the hospital and a dry dressing applied. This should be changed daily until his appt on 09/16/2022 and then can give updated wound care orders at that visit if needed.

## 2022-09-12 NOTE — Progress Notes (Signed)
Remote pacemaker transmission.   

## 2022-09-16 ENCOUNTER — Encounter: Payer: Self-pay | Admitting: Orthopedic Surgery

## 2022-09-16 ENCOUNTER — Ambulatory Visit (INDEPENDENT_AMBULATORY_CARE_PROVIDER_SITE_OTHER): Payer: Medicare Other | Admitting: Orthopedic Surgery

## 2022-09-16 DIAGNOSIS — Z89511 Acquired absence of right leg below knee: Secondary | ICD-10-CM

## 2022-09-16 NOTE — Progress Notes (Signed)
Office Visit Note   Patient: [name]           Date of Birth: [birth year]           MRN: [id number] Visit Date: 09/16/2022              Requested by: Alysia Penna, MD [address] [place],  [place] [address] PCP: Alysia Penna, MD  Chief Complaint  Patient presents with   Right Leg - Routine Post Op    09/04/2022 right BKA      HPI: Patient is a 72 year old gentleman 2 weeks status post right transtibial amputation.  Patient states he anticipates discharge to home in the next several days.  Assessment & Plan: Visit Diagnoses:  1. Right below-knee amputee [facility])     Plan: Plan to follow-up in 2 weeks and removal of the staples.  Patient was provided a prescription for [employer] for prosthesis as well as a 2 XL shrinker.  Follow-Up Instructions: Return in about 2 weeks (around 09/30/2022).   Ortho Exam  Patient is alert, oriented, no adenopathy, well-dressed, normal affect, normal respiratory effort. Examination a transtibial amputation the incision is healing well there is 1 area in the mid substance approximately 2 cm in length that is gaped open a little with serosanguineous drainage there is no cellulitis no signs of infection.  Patient is a new right transtibial  amputee.  Patient's current comorbidities are not expected to impact the ability to function with the prescribed prosthesis. Patient verbally communicates a strong desire to use a prosthesis. Patient currently requires mobility aids to ambulate without a prosthesis.  Expects not to use mobility aids with a new prosthesis.  Patient is a K2 level ambulator that will use a prosthesis to walk around their home and the community over low level environmental barriers.      Imaging: No results found.   Labs: Lab Results  Component Value Date   HGBA1C 6.5 (H) 12/27/2021   HGBA1C 6.6 (H) 06/11/2021   HGBA1C 6.7 (H) 02/02/2021   ESRSEDRATE 60 (H) 09/04/2015   REPTSTATUS 01/31/2022 FINAL 01/25/2022    GRAMSTAIN  09/04/2015    RARE WBC PRESENT, PREDOMINANTLY PMN NO ORGANISMS SEEN    CULT  01/25/2022    NO GROWTH 5 DAYS Performed at [facility], [address], [place], [place] [address]    LABORGA ESCHERICHIA COLI (A) 12/12/2019   LABORGA ENTEROCOCCUS FAECALIS (A) 12/12/2019     Lab Results  Component Value Date   ALBUMIN 3.6 09/04/2022   ALBUMIN 4.2 06/20/2022   ALBUMIN 3.8 05/13/2022   PREALBUMIN 25 09/04/2022    Lab Results  Component Value Date   MG 2.0 06/11/2021   MG 2.3 12/10/2020   MG 2.4 05/29/2020   No results found for: "VD25OH"  Lab Results  Component Value Date   PREALBUMIN 25 09/04/2022      Latest Ref Rng & Units 09/06/2022    3:35 AM 09/05/2022    1:25 AM 09/04/2022    8:21 AM  CBC EXTENDED  WBC 4.0 - 10.5 K/uL 12.6  9.3  8.3   RBC 4.22 - 5.81 MIL/uL 4.07  3.62  4.43   Hemoglobin 13.0 - 17.0 g/dL 82.9  56.2  13.0   HCT 39.0 - 52.0 % 37.6  34.9  42.4   Platelets 150 - 400 K/uL 137  123  145   NEUT# 1.7 - 7.7 K/uL   5.9   Lymph# 0.7 - 4.0 K/uL  0.9      There is no height or weight on file to calculate BMI.  Orders:  No orders of the defined types were placed in this encounter.  No orders of the defined types were placed in this encounter.    Procedures: No procedures performed  Clinical Data: No additional findings.  ROS:  All other systems negative, except as noted in the HPI. Review of Systems  Objective: Vital Signs: There were no vitals taken for this visit.  Specialty Comments:  No specialty comments available.  PMFS History: Patient Active Problem List   Diagnosis Date Noted   Gangrene of right foot (HCC) 09/04/2022   S/P BKA (below knee amputation), right (HCC) 09/04/2022   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) 08/23/2022   CAP (community acquired pneumonia) 12/28/2021   Constipation 12/28/2021   Pain in both lower extremities 07/03/2021   Peripheral neuropathy 07/03/2021   Acute metabolic  encephalopathy 06/09/2021   Marijuana dependence (HCC) 06/09/2021   DNR (do not resuscitate) 06/09/2021   Chest pain 06/08/2021   Renal lesion 02/14/2021   Status post cryoablation 02/14/2021   Allergic rhinitis 12/08/2020   Preop pulmonary/respiratory exam 03/27/2020   Acute on chronic diastolic (congestive) heart failure (HCC) 01/22/2020   Abdominal pain 01/22/2020   Hypothyroidism 12/13/2019   DM2 (diabetes mellitus, type 2) (HCC) 12/13/2019   Chronic kidney disease, stage 3b (HCC) 12/13/2019   Acute bacterial bronchitis 12/13/2019   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   Typical atrial flutter (HCC)    Chronic respiratory failure with hypoxia (HCC) 05/23/2016   CAD (coronary artery disease) 05/23/2016   Persistent atrial fibrillation (HCC)    Atypical atrial flutter (HCC)    Visit for monitoring Tikosyn therapy 04/16/2016   Chronic diastolic CHF (congestive heart failure) (HCC) 02/03/2016   Cough 12/06/2015   Acute on chronic respiratory failure with hypoxia (HCC) 09/08/2015   Hallucinations 09/04/2015   Lymphadenopathy 09/04/2015   Ascending aortic aneurysm (HCC) 09/04/2015   Hypoxia 09/02/2015   S/P aortic valve replacement with bioprosthetic valve 09/02/2015   Pleural effusion 09/02/2015   S/P AVR 08/22/2015   GERD without esophagitis 09/20/2014   Intrinsic asthma 10/13/2013   Dyspnea 09/15/2013   Pulmonary hypertension (HCC) 09/15/2013   Shortness of breath 05/05/2013   Pacemaker-St.Jude 04/01/2012   Bradycardia 03/31/2012   Second degree AV block 03/31/2012   AV block, 1st degree 02/01/2012   PAF (paroxysmal atrial fibrillation) (HCC) 11/13/2011   Fatigue 11/13/2011   Hypertension 11/13/2011   Aortic stenosis 11/13/2011   Sleep apnea 11/13/2011   Past Medical History:  Diagnosis Date   Aortic stenosis, mild    Arthritis of hand    "just a little bit in both hands" (03/31/2012)   Asthma    "little bit" (03/31/2012)   Atrial fibrillation (HCC)    dx '04; DCCV  '04, placed on flecainide, failed DCCV 04/2010, flecainide stopped; s/p successful A.fib ablation 01/31/12   CHF (congestive heart failure) (HCC)    Controlled type 2 diabetes mellitus without complication, without long-term current use of insulin (HCC) 12/13/2019   COPD (chronic obstructive pulmonary disease) (HCC)    DJD (degenerative joint disease)    Fatty liver    Fibromyalgia    GERD (gastroesophageal reflux disease)    Hyperlipidemia    Hypertension    Hypothyroidism    S/P radiation   Left atrial enlargement    LA size 45mm by echo 11/21/11   Mitral regurgitation    trivial  Obstructive sleep apnea    mild by sleep study 2013; pt stated he does not have a machine because "it wasn't bad enough for him to have one"   Pacemaker 03/31/2012   Pneumonia    last time 2023   Restless leg syndrome    Second degree AV block    Splenomegaly     Family History  Problem Relation Age of Onset   Heart failure Mother    Stroke Father    Neuropathy Neg Hx     Past Surgical History:  Procedure Laterality Date   AMPUTATION Right 08/23/2022   Procedure: RIGHT FOOT FIFTH RAY AMPUTATION;  Surgeon: [name], MD;  Location: [facility] OR;  Service: Orthopedics;  Laterality: Right;   AMPUTATION Right 09/04/2022   Procedure: RIGHT BELOW KNEE AMPUTATION;  Surgeon: [name], MD;  Location: [facility] OR;  Service: Orthopedics;  Laterality: Right;   AORTIC VALVE REPLACEMENT N/A 08/22/2015   Procedure: AORTIC VALVE REPLACEMENT (AVR);  Surgeon: Kerin Perna, MD;  Location: [facility] OR;  Service: Open Heart Surgery;  Laterality: N/A;   ATRIAL FIBRILLATION ABLATION  01/30/2012   PVI by Dr. Johney Frame   ATRIAL FIBRILLATION ABLATION N/A 01/31/2012   Procedure: ATRIAL FIBRILLATION ABLATION;  Surgeon: [name], MD;  Location: [facility] CATH LAB;  Service: Cardiovascular;  Laterality: N/A;   ATRIAL FIBRILLATION ABLATION N/A 04/19/2020   Procedure: ATRIAL FIBRILLATION ABLATION;  Surgeon: [name], MD;  Location: [facility]  INVASIVE CV LAB;  Service: Cardiovascular;  Laterality: N/A;   BACK SURGERY     X 3   BIOPSY  01/01/2022   Procedure: BIOPSY;  Surgeon: Kathi Der, MD;  Location: [facility] ENDOSCOPY;  Service: Gastroenterology;;   CARDIAC CATHETERIZATION N/A 08/09/2015   Procedure: Right/Left Heart Cath and Coronary Angiography;  Surgeon: Peter M Swaziland, MD;  Location: [facility] INVASIVE CV LAB;  Service: Cardiovascular;  Laterality: N/A;   CARDIAC CATHETERIZATION N/A 02/05/2016   Procedure: Right/Left Heart Cath and Coronary Angiography;  Surgeon: Corky Crafts, MD;  Location: [facility] INVASIVE CV LAB;  Service: Cardiovascular;  Laterality: N/A;   CARDIAC CATHETERIZATION N/A 04/17/2016   Procedure: Right Heart Cath;  Surgeon: Dolores Patty, MD;  Location: [facility] INVASIVE CV LAB;  Service: Cardiovascular;  Laterality: N/A;   CARDIOVASCULAR STRESS TEST  03/12/2002   EF 48%, NO EVIDENCE OF ISCHEMIA   CARDIOVERSION  01/2012; 03/31/2012   CARDIOVERSION N/A 02/25/2012   Procedure: CARDIOVERSION;  Surgeon: [name], MD;  Location: [facility] CATH LAB;  Service: Cardiovascular;  Laterality: N/A;   CARDIOVERSION N/A 03/31/2012   Procedure: CARDIOVERSION;  Surgeon: [name], MD;  Location: [facility] CATH LAB;  Service: Cardiovascular;  Laterality: N/A;   CARDIOVERSION N/A 11/23/2015   Procedure: CARDIOVERSION;  Surgeon: Laurey Morale, MD;  Location: [facility] [place] [facility] ENDOSCOPY;  Service: Cardiovascular;  Laterality: N/A;   CARDIOVERSION N/A 04/08/2016   Procedure: CARDIOVERSION;  Surgeon: Dolores Patty, MD;  Location: [facility] ENDOSCOPY;  Service: Cardiovascular;  Laterality: N/A;   CARDIOVERSION N/A 09/14/2018   Procedure: CARDIOVERSION;  Surgeon: Wendall Stade, MD;  Location: [facility] ENDOSCOPY;  Service: Cardiovascular;  Laterality: N/A;   CARDIOVERSION N/A 10/19/2019   Procedure: CARDIOVERSION;  Surgeon: Dolores Patty, MD;  Location: [facility] ENDOSCOPY;  Service: Cardiovascular;  Laterality: N/A;   CARDIOVERSION N/A 02/25/2020   Procedure:  CARDIOVERSION;  Surgeon: Pricilla Riffle, MD;  Location: [facility] ENDOSCOPY;  Service: Cardiovascular;  Laterality: N/A;   CARDIOVERSION N/A 05/25/2020   Procedure: CARDIOVERSION;  Surgeon: Lewayne Bunting, MD;  Location:  [facility] ENDOSCOPY;  Service: Cardiovascular;  Laterality: N/A;   CARDIOVERSION N/A 07/10/2020   Procedure: CARDIOVERSION;  Surgeon: Jake Bathe, MD;  Location: [facility] ENDOSCOPY;  Service: Cardiovascular;  Laterality: N/A;   CLIPPING OF ATRIAL APPENDAGE N/A 08/22/2015   Procedure: CLIPPING OF ATRIAL APPENDAGE;  Surgeon: Kerin Perna, MD;  Location: [facility] OR;  Service: Open Heart Surgery;  Laterality: N/A;   COLONOSCOPY WITH PROPOFOL N/A 01/01/2022   Procedure: COLONOSCOPY WITH PROPOFOL;  Surgeon: Kathi Der, MD;  Location: [facility] ENDOSCOPY;  Service: Gastroenterology;  Laterality: N/A;   DOPPLER ECHOCARDIOGRAPHY  03/11/2002   EF 70-75%   FINGER SURGERY Left    Middle finger   FINGER TENDON REPAIR  1980's   "right little finger" (03/31/2012)   FLEXIBLE SIGMOIDOSCOPY N/A 12/30/2021   Procedure: FLEXIBLE SIGMOIDOSCOPY;  Surgeon: Charlott Rakes, MD;  Location: [facility] ENDOSCOPY;  Service: Gastroenterology;  Laterality: N/A;   INSERT / REPLACE / REMOVE PACEMAKER     St Jude   IR RADIOLOGIST EVAL & MGMT  05/06/2019   IR RADIOLOGIST EVAL & MGMT  10/06/2019   IR RADIOLOGIST EVAL & MGMT  10/31/2020   IR RADIOLOGIST EVAL & MGMT  01/18/2021   IR RADIOLOGIST EVAL & MGMT  03/08/2021   IR RADIOLOGIST EVAL & MGMT  06/13/2021   KIDNEY SURGERY Left    LEFT AND RIGHT HEART CATHETERIZATION WITH CORONARY ANGIOGRAM N/A 10/27/2012   Procedure: LEFT AND RIGHT HEART CATHETERIZATION WITH CORONARY ANGIOGRAM;  Surgeon: Pamella Pert, MD;  Location: [facility] CATH LAB;  Service: Cardiovascular;  Laterality: N/A;   LUMBAR DISC SURGERY  1980's X2;  2000's   MAZE N/A 08/22/2015   Procedure: MAZE;  Surgeon: Kerin Perna, MD;  Location: [facility] OR;  Service: Open Heart Surgery;  Laterality: N/A;   PACEMAKER INSERTION   03/31/2012   STJ Accent DR pacemaker implanted by Dr Johney Frame   PERMANENT PACEMAKER INSERTION N/A 03/31/2012   Procedure: PERMANENT PACEMAKER INSERTION;  Surgeon: [name], MD;  Location: [facility] CATH LAB;  Service: Cardiovascular;  Laterality: N/A;   PPM GENERATOR CHANGEOUT N/A 08/08/2020   Procedure: PPM GENERATOR CHANGEOUT;  Surgeon: [name], MD;  Location: [facility] INVASIVE CV LAB;  Service: Cardiovascular;  Laterality: N/A;   RADIOLOGY WITH ANESTHESIA Left 02/14/2021   Procedure: RADIOLOGY WITH ANESTHESIA LEFT RENAL CRYOABLATION;  Surgeon: Irish Lack, MD;  Location: [facility] ORS;  Service: Radiology;  Laterality: Left;   TEE WITHOUT CARDIOVERSION  01/30/2012   Procedure: TRANSESOPHAGEAL ECHOCARDIOGRAM (TEE);  Surgeon: Laurey Morale, MD;  Location: [facility] ENDOSCOPY;  Service: Cardiovascular;  Laterality: N/A;  ablation next day   TEE WITHOUT CARDIOVERSION N/A 08/22/2015   Procedure: TRANSESOPHAGEAL ECHOCARDIOGRAM (TEE);  Surgeon: Kerin Perna, MD;  Location: [facility] OR;  Service: Open Heart Surgery;  Laterality: N/A;   TEE WITHOUT CARDIOVERSION N/A 10/19/2019   Procedure: TRANSESOPHAGEAL ECHOCARDIOGRAM (TEE);  Surgeon: Dolores Patty, MD;  Location: [facility] ENDOSCOPY;  Service: Cardiovascular;  Laterality: N/A;   US ECHOCARDIOGRAPHY  08/07/2009   EF 55-60%   Social History   Occupational History   Occupation: retired  Tobacco Use   Smoking status: Former    Packs/day: 1.00    Years: 25.00    Additional pack years: 0.00    Total pack years: 25.00    Types: Cigarettes    Start date: 1963    Quit date: 07/18/1991    Years since quitting: 31.1    Passive exposure: Never   Smokeless tobacco: Never  Vaping Use   Vaping Use:  Never used  Substance and Sexual Activity   Alcohol use: Not Currently    Comment: occasional   Drug use: Yes    Types: Marijuana    Comment: Marijuana- for nausea   Sexual activity: Not Currently

## 2022-09-30 ENCOUNTER — Encounter: Payer: Self-pay | Admitting: Orthopedic Surgery

## 2022-09-30 ENCOUNTER — Ambulatory Visit (INDEPENDENT_AMBULATORY_CARE_PROVIDER_SITE_OTHER): Payer: Medicare Other | Admitting: Orthopedic Surgery

## 2022-09-30 DIAGNOSIS — Z89511 Acquired absence of right leg below knee: Secondary | ICD-10-CM

## 2022-09-30 MED ORDER — OXYCODONE-ACETAMINOPHEN 5-325 MG PO TABS: 1.0000 | ORAL_TABLET | ORAL | 0 refills | Status: DC | PRN

## 2022-09-30 NOTE — Progress Notes (Signed)
Office Visit Note   Patient: [name]           Date of Birth: [birth year]           MRN: [id number] Visit Date: 09/30/2022              Requested by: Alysia Penna, MD [address] [place],  [place] [address] PCP: Alysia Penna, MD  Chief Complaint  Patient presents with   Right Leg - Routine Post Op    09/04/2022 right BKA       HPI: Patient is a 72 year old gentleman who presents 4 weeks status post right below-knee amputation.  Patient states he still has some pain and is not on antibiotics till has swelling.  Assessment & Plan: Visit Diagnoses:  1. Right below-knee amputee [facility])     Plan: Patient will continue with routine wound cleansing dry dressing changes and compression.  Refill prescription for Percocet provided.  Follow-Up Instructions: Return in about 2 weeks (around 10/14/2022).   Ortho Exam  Patient is alert, oriented, no adenopathy, well-dressed, normal affect, normal respiratory effort. There is no cellulitis no drainage.  Patient does have superficial wound dehiscence there is no depth to the wound and laterally there has been good epithelialization.  Will need to continue the dressing changes.  No indication for antibiotics at this time.  Anticipate moving his follow-up appointment out 4 weeks with [facility].  Imaging: No results found. No images are attached to the encounter.  Labs: Lab Results  Component Value Date   HGBA1C 6.5 (H) 12/27/2021   HGBA1C 6.6 (H) 06/11/2021   HGBA1C 6.7 (H) 02/02/2021   ESRSEDRATE 60 (H) 09/04/2015   REPTSTATUS 01/31/2022 FINAL 01/25/2022   GRAMSTAIN  09/04/2015    RARE WBC PRESENT, PREDOMINANTLY PMN NO ORGANISMS SEEN    CULT  01/25/2022    NO GROWTH 5 DAYS Performed at [facility], [address], [place], [place] [address]    LABORGA ESCHERICHIA COLI (A) 12/12/2019   LABORGA ENTEROCOCCUS FAECALIS (A) 12/12/2019     Lab Results  Component Value Date   ALBUMIN 3.6 09/04/2022   ALBUMIN 4.2  06/20/2022   ALBUMIN 3.8 05/13/2022   PREALBUMIN 25 09/04/2022    Lab Results  Component Value Date   MG 2.0 06/11/2021   MG 2.3 12/10/2020   MG 2.4 05/29/2020   No results found for: "VD25OH"  Lab Results  Component Value Date   PREALBUMIN 25 09/04/2022      Latest Ref Rng & Units 09/06/2022    3:35 AM 09/05/2022    1:25 AM 09/04/2022    8:21 AM  CBC EXTENDED  WBC 4.0 - 10.5 K/uL 12.6  9.3  8.3   RBC 4.22 - 5.81 MIL/uL 4.07  3.62  4.43   Hemoglobin 13.0 - 17.0 g/dL 82.9  56.2  13.0   HCT 39.0 - 52.0 % 37.6  34.9  42.4   Platelets 150 - 400 K/uL 137  123  145   NEUT# 1.7 - 7.7 K/uL   5.9   Lymph# 0.7 - 4.0 K/uL   0.9      There is no height or weight on file to calculate BMI.  Orders:  No orders of the defined types were placed in this encounter.  Meds ordered this encounter  Medications   DISCONTD: oxyCODONE-acetaminophen (PERCOCET/ROXICET) 5-325 MG tablet    Sig: Take 1 tablet by mouth every 4 (four) hours as needed.    Dispense:  30 tablet  Refill:  0   oxyCODONE-acetaminophen (PERCOCET/ROXICET) 5-325 MG tablet    Sig: Take 1 tablet by mouth every 4 (four) hours as needed.    Dispense:  30 tablet    Refill:  0     Procedures: No procedures performed  Clinical Data: No additional findings.  ROS:  All other systems negative, except as noted in the HPI. Review of Systems  Objective: Vital Signs: There were no vitals taken for this visit.  Specialty Comments:  No specialty comments available.  PMFS History: Patient Active Problem List   Diagnosis Date Noted   Gangrene of right foot (HCC) 09/04/2022   S/P BKA (below knee amputation), right (HCC) 09/04/2022   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) 08/23/2022   CAP (community acquired pneumonia) 12/28/2021   Constipation 12/28/2021   Pain in both lower extremities 07/03/2021   Peripheral neuropathy 07/03/2021   Acute metabolic encephalopathy 06/09/2021   Marijuana dependence (HCC)  06/09/2021   DNR (do not resuscitate) 06/09/2021   Chest pain 06/08/2021   Renal lesion 02/14/2021   Status post cryoablation 02/14/2021   Allergic rhinitis 12/08/2020   Preop pulmonary/respiratory exam 03/27/2020   Acute on chronic diastolic (congestive) heart failure (HCC) 01/22/2020   Abdominal pain 01/22/2020   Hypothyroidism 12/13/2019   DM2 (diabetes mellitus, type 2) (HCC) 12/13/2019   Chronic kidney disease, stage 3b (HCC) 12/13/2019   Acute bacterial bronchitis 12/13/2019   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   Typical atrial flutter (HCC)    Chronic respiratory failure with hypoxia (HCC) 05/23/2016   CAD (coronary artery disease) 05/23/2016   Persistent atrial fibrillation (HCC)    Atypical atrial flutter (HCC)    Visit for monitoring Tikosyn therapy 04/16/2016   Chronic diastolic CHF (congestive heart failure) (HCC) 02/03/2016   Cough 12/06/2015   Acute on chronic respiratory failure with hypoxia (HCC) 09/08/2015   Hallucinations 09/04/2015   Lymphadenopathy 09/04/2015   Ascending aortic aneurysm (HCC) 09/04/2015   Hypoxia 09/02/2015   S/P aortic valve replacement with bioprosthetic valve 09/02/2015   Pleural effusion 09/02/2015   S/P AVR 08/22/2015   GERD without esophagitis 09/20/2014   Intrinsic asthma 10/13/2013   Dyspnea 09/15/2013   Pulmonary hypertension (HCC) 09/15/2013   Shortness of breath 05/05/2013   Pacemaker-St.Jude 04/01/2012   Bradycardia 03/31/2012   Second degree AV block 03/31/2012   AV block, 1st degree 02/01/2012   PAF (paroxysmal atrial fibrillation) (HCC) 11/13/2011   Fatigue 11/13/2011   Hypertension 11/13/2011   Aortic stenosis 11/13/2011   Sleep apnea 11/13/2011   Past Medical History:  Diagnosis Date   Aortic stenosis, mild    Arthritis of hand    "just a little bit in both hands" (03/31/2012)   Asthma    "little bit" (03/31/2012)   Atrial fibrillation (HCC)    dx '04; DCCV '04, placed on flecainide, failed DCCV 04/2010, flecainide  stopped; s/p successful A.fib ablation 01/31/12   CHF (congestive heart failure) (HCC)    Controlled type 2 diabetes mellitus without complication, without long-term current use of insulin (HCC) 12/13/2019   COPD (chronic obstructive pulmonary disease) (HCC)    DJD (degenerative joint disease)    Fatty liver    Fibromyalgia    GERD (gastroesophageal reflux disease)    Hyperlipidemia    Hypertension    Hypothyroidism    S/P radiation   Left atrial enlargement    LA size 45mm by echo 11/21/11   Mitral regurgitation    trivial   Obstructive sleep apnea  mild by sleep study 2013; pt stated he does not have a machine because "it wasn't bad enough for him to have one"   Pacemaker 03/31/2012   Pneumonia    last time 2023   Restless leg syndrome    Second degree AV block    Splenomegaly     Family History  Problem Relation Age of Onset   Heart failure Mother    Stroke Father    Neuropathy Neg Hx     Past Surgical History:  Procedure Laterality Date   AMPUTATION Right 08/23/2022   Procedure: RIGHT FOOT FIFTH RAY AMPUTATION;  Surgeon: [name], MD;  Location: [facility] OR;  Service: Orthopedics;  Laterality: Right;   AMPUTATION Right 09/04/2022   Procedure: RIGHT BELOW KNEE AMPUTATION;  Surgeon: [name], MD;  Location: [facility] OR;  Service: Orthopedics;  Laterality: Right;   AORTIC VALVE REPLACEMENT N/A 08/22/2015   Procedure: AORTIC VALVE REPLACEMENT (AVR);  Surgeon: Kerin Perna, MD;  Location: [facility] OR;  Service: Open Heart Surgery;  Laterality: N/A;   ATRIAL FIBRILLATION ABLATION  01/30/2012   PVI by Dr. Johney Frame   ATRIAL FIBRILLATION ABLATION N/A 01/31/2012   Procedure: ATRIAL FIBRILLATION ABLATION;  Surgeon: [name], MD;  Location: [facility] CATH LAB;  Service: Cardiovascular;  Laterality: N/A;   ATRIAL FIBRILLATION ABLATION N/A 04/19/2020   Procedure: ATRIAL FIBRILLATION ABLATION;  Surgeon: [name], MD;  Location: [facility] INVASIVE CV LAB;  Service: Cardiovascular;  Laterality:  N/A;   BACK SURGERY     X 3   BIOPSY  01/01/2022   Procedure: BIOPSY;  Surgeon: Kathi Der, MD;  Location: [facility] ENDOSCOPY;  Service: Gastroenterology;;   CARDIAC CATHETERIZATION N/A 08/09/2015   Procedure: Right/Left Heart Cath and Coronary Angiography;  Surgeon: Peter M Swaziland, MD;  Location: [facility] INVASIVE CV LAB;  Service: Cardiovascular;  Laterality: N/A;   CARDIAC CATHETERIZATION N/A 02/05/2016   Procedure: Right/Left Heart Cath and Coronary Angiography;  Surgeon: Corky Crafts, MD;  Location: [facility] INVASIVE CV LAB;  Service: Cardiovascular;  Laterality: N/A;   CARDIAC CATHETERIZATION N/A 04/17/2016   Procedure: Right Heart Cath;  Surgeon: Dolores Patty, MD;  Location: [facility] INVASIVE CV LAB;  Service: Cardiovascular;  Laterality: N/A;   CARDIOVASCULAR STRESS TEST  03/12/2002   EF 48%, NO EVIDENCE OF ISCHEMIA   CARDIOVERSION  01/2012; 03/31/2012   CARDIOVERSION N/A 02/25/2012   Procedure: CARDIOVERSION;  Surgeon: [name], MD;  Location: [facility] CATH LAB;  Service: Cardiovascular;  Laterality: N/A;   CARDIOVERSION N/A 03/31/2012   Procedure: CARDIOVERSION;  Surgeon: [name], MD;  Location: [facility] CATH LAB;  Service: Cardiovascular;  Laterality: N/A;   CARDIOVERSION N/A 11/23/2015   Procedure: CARDIOVERSION;  Surgeon: Laurey Morale, MD;  Location: [facility] ENDOSCOPY;  Service: Cardiovascular;  Laterality: N/A;   CARDIOVERSION N/A 04/08/2016   Procedure: CARDIOVERSION;  Surgeon: Dolores Patty, MD;  Location: [facility] ENDOSCOPY;  Service: Cardiovascular;  Laterality: N/A;   CARDIOVERSION N/A 09/14/2018   Procedure: CARDIOVERSION;  Surgeon: Wendall Stade, MD;  Location: [facility] ENDOSCOPY;  Service: Cardiovascular;  Laterality: N/A;   CARDIOVERSION N/A 10/19/2019   Procedure: CARDIOVERSION;  Surgeon: Dolores Patty, MD;  Location: [facility] ENDOSCOPY;  Service: Cardiovascular;  Laterality: N/A;   CARDIOVERSION N/A 02/25/2020   Procedure: CARDIOVERSION;  Surgeon: Pricilla Riffle, MD;  Location: [facility]  ENDOSCOPY;  Service: Cardiovascular;  Laterality: N/A;   CARDIOVERSION N/A 05/25/2020   Procedure: CARDIOVERSION;  Surgeon: Lewayne Bunting, MD;  Location: [facility] ENDOSCOPY;  Service: Cardiovascular;  Laterality: N/A;   CARDIOVERSION N/A 07/10/2020   Procedure: CARDIOVERSION;  Surgeon: Jake Bathe, MD;  Location: [facility] ENDOSCOPY;  Service: Cardiovascular;  Laterality: N/A;   CLIPPING OF ATRIAL APPENDAGE N/A 08/22/2015   Procedure: CLIPPING OF ATRIAL APPENDAGE;  Surgeon: Kerin Perna, MD;  Location: [facility] OR;  Service: Open Heart Surgery;  Laterality: N/A;   COLONOSCOPY WITH PROPOFOL N/A 01/01/2022   Procedure: COLONOSCOPY WITH PROPOFOL;  Surgeon: Kathi Der, MD;  Location: [facility] ENDOSCOPY;  Service: Gastroenterology;  Laterality: N/A;   DOPPLER ECHOCARDIOGRAPHY  03/11/2002   EF 70-75%   FINGER SURGERY Left    Middle finger   FINGER TENDON REPAIR  1980's   "right little finger" (03/31/2012)   FLEXIBLE SIGMOIDOSCOPY N/A 12/30/2021   Procedure: FLEXIBLE SIGMOIDOSCOPY;  Surgeon: Charlott Rakes, MD;  Location: [facility] ENDOSCOPY;  Service: Gastroenterology;  Laterality: N/A;   INSERT / REPLACE / REMOVE PACEMAKER     St Jude   IR RADIOLOGIST EVAL & MGMT  05/06/2019   IR RADIOLOGIST EVAL & MGMT  10/06/2019   IR RADIOLOGIST EVAL & MGMT  10/31/2020   IR RADIOLOGIST EVAL & MGMT  01/18/2021   IR RADIOLOGIST EVAL & MGMT  03/08/2021   IR RADIOLOGIST EVAL & MGMT  06/13/2021   KIDNEY SURGERY Left    LEFT AND RIGHT HEART CATHETERIZATION WITH CORONARY ANGIOGRAM N/A 10/27/2012   Procedure: LEFT AND RIGHT HEART CATHETERIZATION WITH CORONARY ANGIOGRAM;  Surgeon: Pamella Pert, MD;  Location: [facility] CATH LAB;  Service: Cardiovascular;  Laterality: N/A;   LUMBAR DISC SURGERY  1980's X2;  2000's   MAZE N/A 08/22/2015   Procedure: MAZE;  Surgeon: Kerin Perna, MD;  Location: [facility] OR;  Service: Open Heart Surgery;  Laterality: N/A;   PACEMAKER INSERTION  03/31/2012   STJ Accent DR pacemaker implanted by Dr Johney Frame    PERMANENT PACEMAKER INSERTION N/A 03/31/2012   Procedure: PERMANENT PACEMAKER INSERTION;  Surgeon: [name], MD;  Location: [facility] CATH LAB;  Service: Cardiovascular;  Laterality: N/A;   PPM GENERATOR CHANGEOUT N/A 08/08/2020   Procedure: PPM GENERATOR CHANGEOUT;  Surgeon: [name], MD;  Location: [facility] INVASIVE CV LAB;  Service: Cardiovascular;  Laterality: N/A;   RADIOLOGY WITH ANESTHESIA Left 02/14/2021   Procedure: RADIOLOGY WITH ANESTHESIA LEFT RENAL CRYOABLATION;  Surgeon: Irish Lack, MD;  Location: [facility] ORS;  Service: Radiology;  Laterality: Left;   TEE WITHOUT CARDIOVERSION  01/30/2012   Procedure: TRANSESOPHAGEAL ECHOCARDIOGRAM (TEE);  Surgeon: Laurey Morale, MD;  Location: [facility] ENDOSCOPY;  Service: Cardiovascular;  Laterality: N/A;  ablation next day   TEE WITHOUT CARDIOVERSION N/A 08/22/2015   Procedure: TRANSESOPHAGEAL ECHOCARDIOGRAM (TEE);  Surgeon: Kerin Perna, MD;  Location: [facility] OR;  Service: Open Heart Surgery;  Laterality: N/A;   TEE WITHOUT CARDIOVERSION N/A 10/19/2019   Procedure: TRANSESOPHAGEAL ECHOCARDIOGRAM (TEE);  Surgeon: Dolores Patty, MD;  Location: [facility] ENDOSCOPY;  Service: Cardiovascular;  Laterality: N/A;   US ECHOCARDIOGRAPHY  08/07/2009   EF 55-60%   Social History   Occupational History   Occupation: retired  Tobacco Use   Smoking status: Former    Packs/day: 1.00    Years: 25.00    Additional pack years: 0.00    Total pack years: 25.00    Types: Cigarettes    Start date: 1963    Quit date: 07/18/1991    Years since quitting: 31.2    Passive exposure: Never   Smokeless tobacco: Never  Vaping Use   Vaping Use: Never used  Substance and Sexual  Activity   Alcohol use: Not Currently    Comment: occasional   Drug use: Yes    Types: Marijuana    Comment: Marijuana- for nausea   Sexual activity: Not Currently

## 2022-10-03 ENCOUNTER — Telehealth: Payer: Self-pay | Admitting: Orthopedic Surgery

## 2022-10-03 NOTE — Telephone Encounter (Signed)
I called and lm on vm to advise silver alginate dressing, 4x4, kerlix and ace to right BKA HHN to change 2-3 x q week. Can call with questions or concerns. If pt needs to come in sooner that 10/14/2022 appt to call and let me know.

## 2022-10-03 NOTE — Telephone Encounter (Signed)
Mandy with [facility] called in regarding [name].  She states that his incision has "opened up" and she needs new wound care orders.  She is suggesting silver alginate.  I offered appt, she states that patient was just seen Monday 09/30/22 (next ov is scheduled for 6/17). You can reach [place] at [phone] to discuss.

## 2022-10-07 ENCOUNTER — Ambulatory Visit (INDEPENDENT_AMBULATORY_CARE_PROVIDER_SITE_OTHER): Payer: Medicare Other | Admitting: Orthopedic Surgery

## 2022-10-07 ENCOUNTER — Telehealth: Payer: Self-pay

## 2022-10-07 DIAGNOSIS — Z89511 Acquired absence of right leg below knee: Secondary | ICD-10-CM

## 2022-10-07 MED ORDER — DOXYCYCLINE HYCLATE 100 MG PO TABS: 100.0000 mg | ORAL_TABLET | Freq: Two times a day (BID) | ORAL | 0 refills | Status: DC

## 2022-10-07 MED ORDER — OXYCODONE-ACETAMINOPHEN 5-325 MG PO TABS: 1.0000 | ORAL_TABLET | ORAL | 0 refills | Status: DC | PRN

## 2022-10-07 NOTE — Telephone Encounter (Signed)
Patient's wife called triage. He had a right below knee amputation on 09/04/2022. He has been having chills since yesterday and a fever of 99.2 today. The [facility] nurse states that his stump does not look good. There is a hole, along with discoloration.  I spoke with Dr.Duda and he advised patient to go to the ED at [facility]. He can be started on IV antibiotics if needed and assessed for anything else that needs to be done. Patient's wife states understanding and will take him now.

## 2022-10-08 ENCOUNTER — Encounter: Payer: Self-pay | Admitting: Cardiovascular Disease

## 2022-10-08 ENCOUNTER — Encounter (HOSPITAL_COMMUNITY): Payer: Self-pay | Admitting: Orthopedic Surgery

## 2022-10-08 ENCOUNTER — Other Ambulatory Visit: Payer: Self-pay

## 2022-10-08 NOTE — Progress Notes (Signed)
Kerry Fort, St. Jude rep was notified about this patient having surgery tomorrow at 11:18 o'clock. Rep was informed that the patient will be at [facility] short stay at 08:45 o'clock.

## 2022-10-08 NOTE — Progress Notes (Signed)
PERIOPERATIVE PRESCRIPTION FOR IMPLANTED CARDIAC DEVICE PROGRAMMING  Patient Information: Name:  [name]  DOB:  10-17-1950  MRN:  [id number]    Planned Procedure:  Revision Right Below Knee Amputation  Surgeon:  Dr. Aldean Baker  Date of Procedure:  10/09/22  Cautery will be used.  Position during surgery:  supine   Please send documentation back to:  [facility] (Fax # [phone])  Device Information:  Clinic EP Physician:  Dr. York Pellant   Device Type:  Pacemaker Manufacturer and Phone #:  St. Jude/[employer]ott: [phone] Pacemaker Dependent?:  Yes.   Date of Last Device Check:  08/22/22 Normal Device Function?:  Yes.    Electrophysiologist's Recommendations:  Have magnet available. Provide continuous ECG monitoring when magnet is used or reprogramming is to be performed.  Procedure should not interfere with device function.  No device programming or magnet placement needed.  Per Device Clinic [address], [place], [place]  8:24 AM 10/08/2022

## 2022-10-08 NOTE — Progress Notes (Signed)
PCP - Eugenie Norrie, MD Cardiologist - Arvilla Meres, MD  PPM/ICD - yes Device Orders - yes Rep Notified - yes  Chest x-ray - 06/20/22 EKG - 08/22/22 Stress Test - 07/14/13 ECHO - 06/10/21 Cardiac Cath - 04/17/16  CPAP - positive for OSA - patient is wearing O2 - 2L at bedtime  Fasting Blood Sugar - patient is not checking CBG at home  Blood Thinner Instructions: Eliquis - hold today and tomorrow per Dr. Lajoyce Corners Patient was instructed: As of today, STOP taking any Aspirin (unless otherwise instructed by your surgeon) Aleve, Naproxen, Ibuprofen, Motrin, Advil, Goody's, BC's, all herbal medications, fish oil, and all vitamins.  ERAS Protcol - yes, until 08:20 o'clock  COVID TEST- n/a  Anesthesia review: yes  Patient verbally denies any shortness of breath, fever, cough and chest pain during phone call   -------------  SDW INSTRUCTIONS given:  Your procedure is scheduled on Wednesday, June 12th, 2024.  Report to [facility] Main Entrance "A" at 08:45 A.M., and check in at the Admitting office.  Call this number if you have problems the morning of surgery:  [phone]   Remember:  Do not eat after midnight the night before your surgery  You may drink clear liquids until 08:20 the morning of your surgery.   Clear liquids allowed are: Water, Non-Citrus Juices (without pulp), Carbonated Beverages, Clear Tea, Black Coffee Only, and Gatorade    Take these medicines the morning of surgery with A SIP OF WATER:  Norvasc, Bisoprolol, Doxycycline, Gabapentin, Synthroid, Protonix, Prednisone, Requip, eye drops, inhaler - please, bring the inhaler with you the day of surgery  PRN: Zofran, Oxycodone  Hold Jardiance 72 hrs prior to surgery   The day of surgery:                     Do not wear jewelry,             Do not wear lotions, powders, colognes, or deodorant.            Men may shave face and neck.            Do not bring valuables to the hospital.            [facility] is not responsible for any belongings or valuables.  Do NOT Smoke (Tobacco/Vaping) 24 hours prior to your procedure If you use a CPAP at night, you may bring all equipment for your overnight stay.   Contacts, glasses, dentures or bridgework may not be worn into surgery.      For patients admitted to the hospital, discharge time will be determined by your treatment team.   Patients discharged the day of surgery will not be allowed to drive home, and someone needs to stay with them for 24 hours.    Special instructions:   [facility]- Preparing For Surgery  Before surgery, you can play an important role. Because skin is not sterile, your skin needs to be as free of germs as possible. You can reduce the number of germs on your skin by washing with CHG (chlorahexidine gluconate) Soap before surgery.  CHG is an antiseptic cleaner which kills germs and bonds with the skin to continue killing germs even after washing.    Oral Hygiene is also important to reduce your risk of infection.  Remember - BRUSH YOUR TEETH THE MORNING OF SURGERY WITH YOUR REGULAR TOOTHPASTE  Please do not use if you have an allergy to CHG or  antibacterial soaps. If your skin becomes reddened/irritated stop using the CHG.  Do not shave (including legs and underarms) for at least 48 hours prior to first CHG shower. It is OK to shave your face.  Please follow these instructions carefully.   Shower the NIGHT BEFORE SURGERY and the MORNING OF SURGERY with DIAL Soap.   Pat yourself dry with a CLEAN TOWEL.  Wear CLEAN PAJAMAS to bed the night before surgery  Place CLEAN SHEETS on your bed the night of your first shower and DO NOT SLEEP WITH PETS.   Day of Surgery: Please shower morning of surgery  Wear Clean/Comfortable clothing the morning of surgery Do not apply any deodorants/lotions.   Remember to brush your teeth WITH YOUR REGULAR TOOTHPASTE.   Questions were answered. Patient verbalized understanding of  instructions.

## 2022-10-08 NOTE — Anesthesia Preprocedure Evaluation (Signed)
Anesthesia Evaluation  Patient identified by MRN, date of birth, ID band Patient awake    Reviewed: Allergy & Precautions, NPO status , Patient's Chart, lab work & pertinent test results, reviewed documented beta blocker date and time   Airway Mallampati: I  TM Distance: >3 FB Neck ROM: Full    Dental  (+) Edentulous Upper, Dental Advisory Given   Pulmonary asthma , sleep apnea (does not use CPAP) and Oxygen sleep apnea , COPD (2L O2 at night),  COPD inhaler and oxygen dependent, former smoker   Pulmonary exam normal breath sounds clear to auscultation       Cardiovascular hypertension, Pt. on home beta blockers and Pt. on medications pulmonary hypertension+ CAD and +CHF  Normal cardiovascular exam+ dysrhythmias Atrial Fibrillation + pacemaker + Valvular Problems/Murmurs (s/p AVR)  Rhythm:Regular Rate:Normal  TTE 2023  1. Left ventricular ejection fraction, by estimation, is 55 to 60%. The  left ventricle has normal function. The left ventricle has no regional  wall motion abnormalities. Left ventricular diastolic parameters are  indeterminate.   2. Right ventricular systolic function is normal. The right ventricular  size is normal. The estimated right ventricular systolic pressure is 32.4  mmHg.   3. The mitral valve is normal in structure. Trivial mitral valve  regurgitation. No evidence of mitral stenosis.   4. The aortic valve has been repaired/replaced. Aortic valve  regurgitation is not visualized. No aortic stenosis is present. There is a  bioprosthetic valve present in the aortic position. Aortic valve area, by  VTI measures 1.63 cm. Aortic valve Vmax  measures 2.28 m/s. Aortic valve mean gradient measures 11.0 mmHg. Aortic  valve peak gradient measures 20.8 mmHg.   5. There is mild dilatation of the ascending aorta, measuring 38 mm.   6. The inferior vena cava is normal in size with greater than 50%  respiratory  variability, suggesting right atrial pressure of 3 mmHg.   7. Compared to echo 12/13/2019, the mean AVG has increased from 8 to  . DVi has dcreased from 0.65 to 0.57 and AVA has decreased from  2.04cm2 to 1.63cm2. The AVA calculation is likely underestimated due to  small LVOT measurement.   8. Left atrial size was mildly dilated.     Neuro/Psych negative neurological ROS  negative psych ROS   GI/Hepatic Neg liver ROS,GERD  ,,  Endo/Other  diabetes, Type 2, Oral Hypoglycemic AgentsHypothyroidism    Renal/GU Renal InsufficiencyRenal disease  negative genitourinary   Musculoskeletal  (+) Arthritis ,  Fibromyalgia -  Abdominal   Peds  Hematology  (+) Blood dyscrasia (eliquis)   Anesthesia Other Findings   Reproductive/Obstetrics                             Anesthesia Physical Anesthesia Plan  ASA: 3  Anesthesia Plan: General   Post-op Pain Management: Tylenol PO (pre-op)*   Induction: Intravenous  PONV Risk Score and Plan: 2 and Ondansetron, Dexamethasone and Treatment may vary due to age or medical condition  Airway Management Planned: LMA  Additional Equipment:   Intra-op Plan:   Post-operative Plan: Extubation in OR  Informed Consent: I have reviewed the patients History and Physical, chart, labs and discussed the procedure including the risks, benefits and alternatives for the proposed anesthesia with the patient or authorized representative who has indicated his/her understanding and acceptance.     Dental advisory given  Plan Discussed with: CRNA  Anesthesia Plan Comments:  Anesthesia Quick Evaluation  

## 2022-10-08 NOTE — Progress Notes (Signed)
Preop device orders requested on 10/08/22

## 2022-10-09 ENCOUNTER — Inpatient Hospital Stay (HOSPITAL_COMMUNITY)
Admission: RE | Admit: 2022-10-09 | Discharge: 2022-10-12 | DRG: 465 | Disposition: A | Discharge status: Home / Self Care | Payer: Medicare Other | Attending: Orthopedic Surgery | Admitting: Orthopedic Surgery

## 2022-10-09 ENCOUNTER — Inpatient Hospital Stay (HOSPITAL_COMMUNITY): Payer: Medicare Other | Admitting: Vascular Surgery

## 2022-10-09 ENCOUNTER — Encounter (HOSPITAL_COMMUNITY)
Admission: RE | Disposition: A | Discharge status: Home / Self Care | Payer: Self-pay | Source: Home / Self Care | Attending: Orthopedic Surgery

## 2022-10-09 ENCOUNTER — Telehealth: Payer: Self-pay | Admitting: Orthopedic Surgery

## 2022-10-09 ENCOUNTER — Encounter (HOSPITAL_COMMUNITY): Payer: Self-pay | Admitting: Orthopedic Surgery

## 2022-10-09 DIAGNOSIS — Z881 Allergy status to other antibiotic agents status: Secondary | ICD-10-CM

## 2022-10-09 DIAGNOSIS — J4489 Other specified chronic obstructive pulmonary disease: Secondary | ICD-10-CM | POA: Diagnosis present

## 2022-10-09 DIAGNOSIS — I2510 Atherosclerotic heart disease of native coronary artery without angina pectoris: Secondary | ICD-10-CM

## 2022-10-09 DIAGNOSIS — Z923 Personal history of irradiation: Secondary | ICD-10-CM | POA: Diagnosis not present

## 2022-10-09 DIAGNOSIS — Z823 Family history of stroke: Secondary | ICD-10-CM | POA: Diagnosis not present

## 2022-10-09 DIAGNOSIS — S88111A Complete traumatic amputation at level between knee and ankle, right lower leg, initial encounter: Secondary | ICD-10-CM | POA: Diagnosis present

## 2022-10-09 DIAGNOSIS — G2581 Restless legs syndrome: Secondary | ICD-10-CM | POA: Diagnosis present

## 2022-10-09 DIAGNOSIS — E039 Hypothyroidism, unspecified: Secondary | ICD-10-CM | POA: Diagnosis present

## 2022-10-09 DIAGNOSIS — Z89511 Acquired absence of right leg below knee: Secondary | ICD-10-CM | POA: Diagnosis not present

## 2022-10-09 DIAGNOSIS — Z950 Presence of cardiac pacemaker: Secondary | ICD-10-CM

## 2022-10-09 DIAGNOSIS — I110 Hypertensive heart disease with heart failure: Secondary | ICD-10-CM

## 2022-10-09 DIAGNOSIS — Z8249 Family history of ischemic heart disease and other diseases of the circulatory system: Secondary | ICD-10-CM

## 2022-10-09 DIAGNOSIS — I4891 Unspecified atrial fibrillation: Secondary | ICD-10-CM | POA: Diagnosis present

## 2022-10-09 DIAGNOSIS — I509 Heart failure, unspecified: Secondary | ICD-10-CM

## 2022-10-09 DIAGNOSIS — J449 Chronic obstructive pulmonary disease, unspecified: Secondary | ICD-10-CM | POA: Diagnosis not present

## 2022-10-09 DIAGNOSIS — Z886 Allergy status to analgesic agent status: Secondary | ICD-10-CM | POA: Diagnosis not present

## 2022-10-09 DIAGNOSIS — E119 Type 2 diabetes mellitus without complications: Secondary | ICD-10-CM | POA: Diagnosis present

## 2022-10-09 DIAGNOSIS — T8781 Dehiscence of amputation stump: Secondary | ICD-10-CM

## 2022-10-09 DIAGNOSIS — Y835 Amputation of limb(s) as the cause of abnormal reaction of the patient, or of later complication, without mention of misadventure at the time of the procedure: Secondary | ICD-10-CM | POA: Diagnosis present

## 2022-10-09 DIAGNOSIS — K760 Fatty (change of) liver, not elsewhere classified: Secondary | ICD-10-CM | POA: Diagnosis present

## 2022-10-09 DIAGNOSIS — E785 Hyperlipidemia, unspecified: Secondary | ICD-10-CM | POA: Diagnosis present

## 2022-10-09 DIAGNOSIS — G4733 Obstructive sleep apnea (adult) (pediatric): Secondary | ICD-10-CM | POA: Diagnosis present

## 2022-10-09 DIAGNOSIS — M797 Fibromyalgia: Secondary | ICD-10-CM | POA: Diagnosis present

## 2022-10-09 DIAGNOSIS — Z87891 Personal history of nicotine dependence: Secondary | ICD-10-CM

## 2022-10-09 DIAGNOSIS — K219 Gastro-esophageal reflux disease without esophagitis: Secondary | ICD-10-CM | POA: Diagnosis present

## 2022-10-09 DIAGNOSIS — I10 Essential (primary) hypertension: Secondary | ICD-10-CM | POA: Diagnosis present

## 2022-10-09 HISTORY — PX: STUMP REVISION: SHX6102

## 2022-10-09 LAB — BASIC METABOLIC PANEL
Anion gap: 13 (ref 5–15)
BUN: 32 mg/dL — ABNORMAL HIGH (ref 8–23)
CO2: 29 mmol/L (ref 22–32)
Calcium: 9.7 mg/dL (ref 8.9–10.3)
Chloride: 94 mmol/L — ABNORMAL LOW (ref 98–111)
Creatinine, Ser: 1.84 mg/dL — ABNORMAL HIGH (ref 0.61–1.24)
GFR, Estimated: 38 mL/min — ABNORMAL LOW (ref >60)
Glucose, Bld: 116 mg/dL — ABNORMAL HIGH (ref 70–99)
Potassium: 4 mmol/L (ref 3.5–5.1)
Sodium: 136 mmol/L (ref 135–145)

## 2022-10-09 LAB — CBC
HCT: 46.2 % (ref 39.0–52.0)
Hemoglobin: 14.4 g/dL (ref 13.0–17.0)
MCH: 29.4 pg (ref 26.0–34.0)
MCHC: 31.2 g/dL (ref 30.0–36.0)
MCV: 94.5 fL (ref 80.0–100.0)
Platelets: 165 10*3/uL (ref 150–400)
RBC: 4.89 MIL/uL (ref 4.22–5.81)
RDW: 14.1 % (ref 11.5–15.5)
WBC: 8.9 10*3/uL (ref 4.0–10.5)
nRBC: 0 % (ref 0.0–0.2)

## 2022-10-09 LAB — GLUCOSE, CAPILLARY
Glucose-Capillary: 129 mg/dL — ABNORMAL HIGH (ref 70–99)
Glucose-Capillary: 132 mg/dL — ABNORMAL HIGH (ref 70–99)
Glucose-Capillary: 116 mg/dL — ABNORMAL HIGH (ref 70–99)
Glucose-Capillary: 122 mg/dL — ABNORMAL HIGH (ref 70–99)

## 2022-10-09 SURGERY — REVISION, AMPUTATION SITE
Anesthesia: General | Site: Leg Lower | Laterality: Right

## 2022-10-09 MED ORDER — CHLORHEXIDINE GLUCONATE 0.12 % MT SOLN
OROMUCOSAL | Status: AC
  Filled 2022-10-09: qty 15

## 2022-10-09 MED ORDER — ACETAMINOPHEN 500 MG PO TABS
ORAL_TABLET | ORAL | Status: AC
  Filled 2022-10-09: qty 2

## 2022-10-09 MED ORDER — AMLODIPINE BESYLATE 5 MG PO TABS
5.0000 mg | ORAL_TABLET | Freq: Every day | ORAL | Status: DC
  Administered 2022-10-10 – 2022-10-12 (×3): 5 mg via ORAL
  Filled 2022-10-09 (×3): qty 1

## 2022-10-09 MED ORDER — DOCUSATE SODIUM 100 MG PO CAPS
100.0000 mg | ORAL_CAPSULE | Freq: Every day | ORAL | Status: DC
  Administered 2022-10-10 – 2022-10-12 (×3): 100 mg via ORAL
  Filled 2022-10-09 (×3): qty 1

## 2022-10-09 MED ORDER — CHLORHEXIDINE GLUCONATE 0.12 % MT SOLN
15.0000 mL | Freq: Once | OROMUCOSAL | Status: AC
  Administered 2022-10-09: 15 mL via OROMUCOSAL

## 2022-10-09 MED ORDER — VANCOMYCIN HCL 1000 MG IV SOLR
INTRAVENOUS | Status: AC
  Filled 2022-10-09: qty 20

## 2022-10-09 MED ORDER — HYDRALAZINE HCL 20 MG/ML IJ SOLN: 5.0000 mg | INTRAMUSCULAR | Status: DC | PRN

## 2022-10-09 MED ORDER — FENTANYL CITRATE (PF) 100 MCG/2ML IJ SOLN
INTRAMUSCULAR | Status: AC
  Filled 2022-10-09: qty 2

## 2022-10-09 MED ORDER — METOPROLOL TARTRATE 5 MG/5ML IV SOLN: 2.0000 mg | INTRAVENOUS | Status: DC | PRN

## 2022-10-09 MED ORDER — VANCOMYCIN HCL 1000 MG IV SOLR
INTRAVENOUS | Status: DC | PRN
  Administered 2022-10-09: 1000 mg via TOPICAL

## 2022-10-09 MED ORDER — PANTOPRAZOLE SODIUM 40 MG PO TBEC
40.0000 mg | DELAYED_RELEASE_TABLET | Freq: Every day | ORAL | Status: DC
  Administered 2022-10-09 – 2022-10-12 (×4): 40 mg via ORAL
  Filled 2022-10-09 (×4): qty 1

## 2022-10-09 MED ORDER — HYDROMORPHONE HCL 1 MG/ML IJ SOLN
0.2500 mg | INTRAMUSCULAR | Status: DC | PRN
  Administered 2022-10-09 (×4): 0.5 mg via INTRAVENOUS

## 2022-10-09 MED ORDER — MAGNESIUM CITRATE PO SOLN: 1.0000 | Freq: Once | ORAL | Status: DC | PRN

## 2022-10-09 MED ORDER — GUAIFENESIN-DM 100-10 MG/5ML PO SYRP: 15.0000 mL | ORAL_SOLUTION | ORAL | Status: DC | PRN

## 2022-10-09 MED ORDER — ACETAMINOPHEN 500 MG PO TABS: 1000.0000 mg | ORAL_TABLET | Freq: Once | ORAL | Status: DC

## 2022-10-09 MED ORDER — LIDOCAINE 2% (20 MG/ML) 5 ML SYRINGE
INTRAMUSCULAR | Status: DC | PRN
  Administered 2022-10-09: 60 mg via INTRAVENOUS

## 2022-10-09 MED ORDER — HYDROMORPHONE HCL 1 MG/ML IJ SOLN
INTRAMUSCULAR | Status: DC | PRN
  Administered 2022-10-09 (×2): .5 mg via INTRAVENOUS

## 2022-10-09 MED ORDER — 0.9 % SODIUM CHLORIDE (POUR BTL) OPTIME
TOPICAL | Status: DC | PRN
  Administered 2022-10-09: 1000 mL

## 2022-10-09 MED ORDER — OXYCODONE HCL 5 MG PO TABS
10.0000 mg | ORAL_TABLET | ORAL | Status: DC | PRN
  Administered 2022-10-09 – 2022-10-12 (×9): 15 mg via ORAL
  Filled 2022-10-09 (×9): qty 3

## 2022-10-09 MED ORDER — MIDAZOLAM HCL 2 MG/2ML IJ SOLN
INTRAMUSCULAR | Status: AC
  Filled 2022-10-09: qty 2

## 2022-10-09 MED ORDER — CEFAZOLIN SODIUM-DEXTROSE 2-4 GM/100ML-% IV SOLN
2.0000 g | INTRAVENOUS | Status: AC
  Administered 2022-10-09: 2 g via INTRAVENOUS

## 2022-10-09 MED ORDER — FLUTICASONE FUROATE-VILANTEROL 100-25 MCG/ACT IN AEPB
1.0000 | INHALATION_SPRAY | Freq: Every day | RESPIRATORY_TRACT | Status: DC
  Administered 2022-10-10 – 2022-10-12 (×3): 1 via RESPIRATORY_TRACT
  Filled 2022-10-09: qty 28

## 2022-10-09 MED ORDER — POLYETHYLENE GLYCOL 3350 17 G PO PACK: 17.0000 g | PACK | Freq: Every day | ORAL | Status: DC | PRN

## 2022-10-09 MED ORDER — CEFAZOLIN SODIUM-DEXTROSE 2-4 GM/100ML-% IV SOLN
INTRAVENOUS | Status: AC
  Filled 2022-10-09: qty 100

## 2022-10-09 MED ORDER — BISOPROLOL FUMARATE 5 MG PO TABS
5.0000 mg | ORAL_TABLET | Freq: Once | ORAL | Status: AC
  Administered 2022-10-09: 5 mg via ORAL
  Filled 2022-10-09: qty 1

## 2022-10-09 MED ORDER — ZINC SULFATE 220 (50 ZN) MG PO CAPS
220.0000 mg | ORAL_CAPSULE | Freq: Every day | ORAL | Status: DC
  Administered 2022-10-09 – 2022-10-12 (×4): 220 mg via ORAL
  Filled 2022-10-09 (×4): qty 1

## 2022-10-09 MED ORDER — GABAPENTIN 300 MG PO CAPS
600.0000 mg | ORAL_CAPSULE | Freq: Three times a day (TID) | ORAL | Status: DC
  Administered 2022-10-09 – 2022-10-12 (×9): 600 mg via ORAL
  Filled 2022-10-09 (×9): qty 2

## 2022-10-09 MED ORDER — ONDANSETRON HCL 4 MG/2ML IJ SOLN: 4.0000 mg | Freq: Four times a day (QID) | INTRAMUSCULAR | Status: DC | PRN

## 2022-10-09 MED ORDER — HYDROMORPHONE HCL 1 MG/ML IJ SOLN
INTRAMUSCULAR | Status: AC
  Filled 2022-10-09: qty 1

## 2022-10-09 MED ORDER — APIXABAN 5 MG PO TABS
5.0000 mg | ORAL_TABLET | Freq: Two times a day (BID) | ORAL | Status: DC
  Administered 2022-10-10 – 2022-10-12 (×5): 5 mg via ORAL
  Filled 2022-10-09 (×5): qty 1

## 2022-10-09 MED ORDER — FENTANYL CITRATE (PF) 250 MCG/5ML IJ SOLN
INTRAMUSCULAR | Status: AC
  Filled 2022-10-09: qty 5

## 2022-10-09 MED ORDER — BISOPROLOL FUMARATE 5 MG PO TABS
5.0000 mg | ORAL_TABLET | Freq: Every day | ORAL | Status: DC
  Administered 2022-10-10 – 2022-10-12 (×3): 5 mg via ORAL
  Filled 2022-10-09 (×4): qty 1

## 2022-10-09 MED ORDER — MAGNESIUM SULFATE 2 GM/50ML IV SOLN: 2.0000 g | Freq: Every day | INTRAVENOUS | Status: DC | PRN

## 2022-10-09 MED ORDER — LABETALOL HCL 5 MG/ML IV SOLN: 10.0000 mg | INTRAVENOUS | Status: DC | PRN

## 2022-10-09 MED ORDER — HYDROMORPHONE HCL 1 MG/ML IJ SOLN
0.5000 mg | INTRAMUSCULAR | Status: DC | PRN
  Administered 2022-10-09 – 2022-10-12 (×5): 1 mg via INTRAVENOUS
  Filled 2022-10-09 (×5): qty 1

## 2022-10-09 MED ORDER — OXYCODONE HCL 5 MG PO TABS
5.0000 mg | ORAL_TABLET | ORAL | Status: DC | PRN
  Administered 2022-10-09: 5 mg via ORAL
  Administered 2022-10-11 (×2): 10 mg via ORAL
  Filled 2022-10-09 (×3): qty 2

## 2022-10-09 MED ORDER — POTASSIUM CHLORIDE CRYS ER 20 MEQ PO TBCR: 20.0000 meq | EXTENDED_RELEASE_TABLET | Freq: Every day | ORAL | Status: DC | PRN

## 2022-10-09 MED ORDER — ALUM & MAG HYDROXIDE-SIMETH 200-200-20 MG/5ML PO SUSP: 15.0000 mL | ORAL | Status: DC | PRN

## 2022-10-09 MED ORDER — ONDANSETRON HCL 4 MG/2ML IJ SOLN
INTRAMUSCULAR | Status: AC
  Filled 2022-10-09: qty 2

## 2022-10-09 MED ORDER — PROPOFOL 10 MG/ML IV BOLUS
INTRAVENOUS | Status: AC
  Filled 2022-10-09: qty 20

## 2022-10-09 MED ORDER — ACETAMINOPHEN 325 MG PO TABS: 325.0000 mg | ORAL_TABLET | Freq: Four times a day (QID) | ORAL | Status: DC | PRN

## 2022-10-09 MED ORDER — LEVOTHYROXINE SODIUM 75 MCG PO TABS
175.0000 ug | ORAL_TABLET | Freq: Every day | ORAL | Status: DC
  Administered 2022-10-10 – 2022-10-12 (×3): 175 ug via ORAL
  Filled 2022-10-09 (×3): qty 1

## 2022-10-09 MED ORDER — MIDAZOLAM HCL 5 MG/5ML IJ SOLN
INTRAMUSCULAR | Status: DC | PRN
  Administered 2022-10-09: 1 mg via INTRAVENOUS

## 2022-10-09 MED ORDER — CEFAZOLIN SODIUM-DEXTROSE 2-4 GM/100ML-% IV SOLN
2.0000 g | Freq: Three times a day (TID) | INTRAVENOUS | Status: AC
  Administered 2022-10-09 – 2022-10-10 (×2): 2 g via INTRAVENOUS
  Filled 2022-10-09 (×2): qty 100

## 2022-10-09 MED ORDER — LACTATED RINGERS IV SOLN: INTRAVENOUS | Status: DC

## 2022-10-09 MED ORDER — PHENOL 1.4 % MT LIQD: 1.0000 | OROMUCOSAL | Status: DC | PRN

## 2022-10-09 MED ORDER — EMPAGLIFLOZIN 10 MG PO TABS
10.0000 mg | ORAL_TABLET | Freq: Every day | ORAL | Status: DC
  Administered 2022-10-09 – 2022-10-12 (×4): 10 mg via ORAL
  Filled 2022-10-09 (×4): qty 1

## 2022-10-09 MED ORDER — JUVEN PO PACK
1.0000 | PACK | Freq: Two times a day (BID) | ORAL | Status: DC
  Administered 2022-10-09 – 2022-10-12 (×6): 1 via ORAL
  Filled 2022-10-09 (×6): qty 1

## 2022-10-09 MED ORDER — PROPOFOL 10 MG/ML IV BOLUS
INTRAVENOUS | Status: DC | PRN
  Administered 2022-10-09: 100 mg via INTRAVENOUS

## 2022-10-09 MED ORDER — FENTANYL CITRATE (PF) 100 MCG/2ML IJ SOLN
25.0000 ug | INTRAMUSCULAR | Status: DC | PRN
  Administered 2022-10-09 (×3): 50 ug via INTRAVENOUS

## 2022-10-09 MED ORDER — FENTANYL CITRATE (PF) 100 MCG/2ML IJ SOLN
INTRAMUSCULAR | Status: DC | PRN
  Administered 2022-10-09 (×5): 50 ug via INTRAVENOUS

## 2022-10-09 MED ORDER — MONTELUKAST SODIUM 10 MG PO TABS
10.0000 mg | ORAL_TABLET | Freq: Every day | ORAL | Status: DC
  Administered 2022-10-09 – 2022-10-11 (×3): 10 mg via ORAL
  Filled 2022-10-09 (×3): qty 1

## 2022-10-09 MED ORDER — ACETAMINOPHEN 500 MG PO TABS
1000.0000 mg | ORAL_TABLET | Freq: Once | ORAL | Status: AC
  Administered 2022-10-09: 1000 mg via ORAL

## 2022-10-09 MED ORDER — SODIUM CHLORIDE 0.9 % IV SOLN: INTRAVENOUS | Status: DC

## 2022-10-09 MED ORDER — BUDESON-GLYCOPYRROL-FORMOTEROL 160-9-4.8 MCG/ACT IN AERO: 2.0000 | INHALATION_SPRAY | Freq: Every evening | RESPIRATORY_TRACT | Status: DC | PRN

## 2022-10-09 MED ORDER — ALBUTEROL SULFATE (2.5 MG/3ML) 0.083% IN NEBU: 3.0000 mL | INHALATION_SOLUTION | Freq: Four times a day (QID) | RESPIRATORY_TRACT | Status: DC | PRN

## 2022-10-09 MED ORDER — BISACODYL 5 MG PO TBEC: 5.0000 mg | DELAYED_RELEASE_TABLET | Freq: Every day | ORAL | Status: DC | PRN

## 2022-10-09 MED ORDER — UMECLIDINIUM BROMIDE 62.5 MCG/ACT IN AEPB
1.0000 | INHALATION_SPRAY | Freq: Every day | RESPIRATORY_TRACT | Status: DC
  Administered 2022-10-10 – 2022-10-12 (×3): 1 via RESPIRATORY_TRACT
  Filled 2022-10-09: qty 7

## 2022-10-09 MED ORDER — PHENYLEPHRINE 80 MCG/ML (10ML) SYRINGE FOR IV PUSH (FOR BLOOD PRESSURE SUPPORT)
PREFILLED_SYRINGE | INTRAVENOUS | Status: DC | PRN
  Administered 2022-10-09: 160 ug via INTRAVENOUS
  Administered 2022-10-09: 80 ug via INTRAVENOUS

## 2022-10-09 MED ORDER — VITAMIN C 500 MG PO TABS
1000.0000 mg | ORAL_TABLET | Freq: Every day | ORAL | Status: DC
  Administered 2022-10-09 – 2022-10-12 (×4): 1000 mg via ORAL
  Filled 2022-10-09 (×4): qty 2

## 2022-10-09 MED ORDER — ORAL CARE MOUTH RINSE: 15.0000 mL | Freq: Once | OROMUCOSAL | Status: AC

## 2022-10-09 SURGICAL SUPPLY — 35 items
BAG COUNTER SPONGE SURGICOUNT (BAG) ×1 IMPLANT
BAG SPNG CNTER NS LX DISP (BAG) ×1
BLADE SAW RECIP 87.9 MT (BLADE) IMPLANT
BLADE SURG 21 STRL SS (BLADE) ×1 IMPLANT
CANISTER WOUND CARE 500ML ATS (WOUND CARE) ×1 IMPLANT
COVER SURGICAL LIGHT HANDLE (MISCELLANEOUS) ×1 IMPLANT
DRAPE DERMATAC (DRAPES) IMPLANT
DRAPE EXTREMITY T 121X128X90 (DISPOSABLE) ×1 IMPLANT
DRAPE HALF SHEET 40X57 (DRAPES) ×1 IMPLANT
DRAPE INCISE IOBAN 66X45 STRL (DRAPES) ×1 IMPLANT
DRAPE U-SHAPE 47X51 STRL (DRAPES) ×2 IMPLANT
DRESSING PREVENA PLUS CUSTOM (GAUZE/BANDAGES/DRESSINGS) ×1 IMPLANT
DRSG PREVENA PLUS CUSTOM (GAUZE/BANDAGES/DRESSINGS)
DURAPREP 26ML APPLICATOR (WOUND CARE) ×1 IMPLANT
ELECT REM PT RETURN 9FT ADLT (ELECTROSURGICAL) ×1
ELECTRODE REM PT RTRN 9FT ADLT (ELECTROSURGICAL) ×1 IMPLANT
GLOVE BIOGEL PI IND STRL 9 (GLOVE) ×1 IMPLANT
GLOVE SURG ORTHO 9.0 STRL STRW (GLOVE) ×1 IMPLANT
GOWN STRL REUS W/ TWL XL LVL3 (GOWN DISPOSABLE) ×2 IMPLANT
GOWN STRL REUS W/TWL XL LVL3 (GOWN DISPOSABLE) ×2
GRAFT SKIN WND MICRO 38 (Tissue) IMPLANT
KIT BASIN OR (CUSTOM PROCEDURE TRAY) ×1 IMPLANT
KIT TURNOVER KIT B (KITS) ×1 IMPLANT
MANIFOLD NEPTUNE II (INSTRUMENTS) ×1 IMPLANT
NS IRRIG 1000ML POUR BTL (IV SOLUTION) ×1 IMPLANT
PACK GENERAL/GYN (CUSTOM PROCEDURE TRAY) ×1 IMPLANT
PAD ARMBOARD 7.5X6 YLW CONV (MISCELLANEOUS) ×1 IMPLANT
PREVENA INCISION MGT 90 150 (MISCELLANEOUS) IMPLANT
PREVENA RESTOR ARTHOFORM 46X30 (CANNISTER) ×1 IMPLANT
STAPLER VISISTAT 35W (STAPLE) IMPLANT
SUT ETHILON 2 0 PSLX (SUTURE) ×2 IMPLANT
SUT ETHILON 2 LR (SUTURE) IMPLANT
SUT SILK 2 0 (SUTURE) ×1
SUT SILK 2-0 18XBRD TIE 12 (SUTURE) IMPLANT
TOWEL GREEN STERILE (TOWEL DISPOSABLE) ×1 IMPLANT

## 2022-10-09 NOTE — Telephone Encounter (Signed)
I called and sw pt's wife and she wanted to know If she should bring the pt's limp protector to the hospital. He was a revision BKA surgery today and I advised that it would be a good idea. She will call with any other questions.

## 2022-10-09 NOTE — Interval H&P Note (Signed)
History and Physical Interval Note:  10/09/2022 9:53 AM  [name]  has presented today for surgery, with the diagnosis of Dehiscence Right Below Knee Amputation.  The various methods of treatment have been discussed with the patient and family. After consideration of risks, benefits and other options for treatment, the patient has consented to  Procedure(s): REVISION RIGHT BELOW KNEE AMPUTATION (Right) as a surgical intervention.  The patient's history has been reviewed, patient examined, no change in status, stable for surgery.  I have reviewed the patient's chart and labs.  Questions were answered to the patient's satisfaction.     [name]

## 2022-10-09 NOTE — Op Note (Signed)
10/09/2022  12:17 PM  PATIENT:  [name]    PRE-OPERATIVE DIAGNOSIS:  Dehiscence Right Below Knee Amputation  POST-OPERATIVE DIAGNOSIS:  Same  PROCEDURE:  REVISION RIGHT BELOW KNEE AMPUTATION Application Kerecis micro graft 38 cm and application of vancomycin powder 1 g. Application Prevena customizable and [place] Border form wound VAC.  SURGEON:  [name], MD  PHYSICIAN ASSISTANT:None ANESTHESIA:   General  PREOPERATIVE INDICATIONS:  [name] is a  72 y.o. male with a diagnosis of Dehiscence Right Below Knee Amputation who failed conservative measures and elected for surgical management.    The risks benefits and alternatives were discussed with the patient preoperatively including but not limited to the risks of infection, bleeding, nerve injury, cardiopulmonary complications, the need for revision surgery, among others, and the patient was willing to proceed.  OPERATIVE IMPLANTS:   Implant Name Type Inv. Item Serial No. Manufacturer Lot No. LRB No. Used Action  GRAFT SKIN WND MICRO 38 - [id number] Tissue GRAFT SKIN WND MICRO 38  [employer] [id number] Right 1 Implanted    @ENCIMAGES @  OPERATIVE FINDINGS: Tissue margins were clear.    Tissue was sent for cultures.     Wound bed filled with 1 g vancomycin powder and 38 cm Kerecis tissue graft.  OPERATIVE PROCEDURE: Patient was brought the operating room and underwent general anesthetic.  After adequate levels anesthesia obtained patient's right lower extremity was prepped using DuraPrep draped into a sterile field a timeout was called.  A fishmouth incision was made around the necrotic wound dehiscence.  This was carried down to the bone.  The distal 2 cm of tibia and fibula were resected.  There was bleeding from the calcified vessels and stasis was obtained with electrocautery.  The wound was irrigated with normal saline the tissue margins were clear the muscle was viable and contractile.  The wound bed was  filled with 1 g vancomycin powder and 38 cm of Kerecis tissue graft.  Using #2 nylon and 2-0 nylon the incision was closed.  The wound was covered with the customizable wound VAC this was overwrapped with the axial form wound VAC sponge this had a good suction fit patient was extubated taken the PACU in stable condition.   DISCHARGE PLANNING:  Antibiotic duration: Plan for 24 hours antibiotics would add antibiotics based on culture sensitivities.  Weightbearing: Nonweightbearing on the right  Pain medication: Opioid pathway  Dressing care/ Wound VAC: Wound VAC  Ambulatory devices: Walker or crutches  Discharge to: Anticipate discharge to home once safe from his therapy standpoint  Follow-up: In the office 1 week post operative.

## 2022-10-09 NOTE — Anesthesia Procedure Notes (Signed)
Procedure Name: LMA Insertion Date/Time: 10/09/2022 11:25 AM  Performed by: Marny Lowenstein, CRNAPre-anesthesia Checklist: Patient identified, Emergency Drugs available, Suction available and Patient being monitored Patient Re-evaluated:Patient Re-evaluated prior to induction Oxygen Delivery Method: Circle system utilized Preoxygenation: Pre-oxygenation with 100% oxygen Induction Type: IV induction Ventilation: Mask ventilation without difficulty LMA: LMA inserted LMA Size: 4.0 Number of attempts: 1 Placement Confirmation: positive ETCO2 and breath sounds checked- equal and bilateral Tube secured with: Tape Dental Injury: Teeth and Oropharynx as per pre-operative assessment

## 2022-10-09 NOTE — Telephone Encounter (Signed)
Pt's wife Chrisite called requesting a call back. Pt had surgery and they have medical questions. Please call pt at [phone]

## 2022-10-09 NOTE — H&P (Signed)
[name] is an 72 y.o. male.   Chief Complaint: Dehiscence right below-knee amputation. HPI: Patient is a 72 year old gentleman who presents 4 weeks status post right below-knee amputation. Patient states he still has some pain and is not on antibiotics still has swelling.   Past Medical History:  Diagnosis Date   Aortic stenosis, mild    Arthritis of hand    "just a little bit in both hands" (03/31/2012)   Asthma    "little bit" (03/31/2012)   Atrial fibrillation (HCC)    dx '04; DCCV '04, placed on flecainide, failed DCCV 04/2010, flecainide stopped; s/p successful A.fib ablation 01/31/12   CHF (congestive heart failure) (HCC)    Controlled type 2 diabetes mellitus without complication, without long-term current use of insulin (HCC) 12/13/2019   COPD (chronic obstructive pulmonary disease) (HCC)    DJD (degenerative joint disease)    Fatty liver    Fibromyalgia    GERD (gastroesophageal reflux disease)    Hyperlipidemia    Hypertension    Hypothyroidism    S/P radiation   Left atrial enlargement    LA size 45mm by echo 11/21/11   Mitral regurgitation    trivial   Obstructive sleep apnea    mild by sleep study 2013; pt stated he does not have a machine because "it wasn't bad enough for him to have one"   Pacemaker 03/31/2012   Pneumonia    last time 2023   Restless leg syndrome    Second degree AV block    Splenomegaly     Past Surgical History:  Procedure Laterality Date   AMPUTATION Right 08/23/2022   Procedure: RIGHT FOOT FIFTH RAY AMPUTATION;  Surgeon: [name], MD;  Location: [facility] OR;  Service: Orthopedics;  Laterality: Right;   AMPUTATION Right 09/04/2022   Procedure: RIGHT BELOW KNEE AMPUTATION;  Surgeon: [name], MD;  Location: [facility] OR;  Service: Orthopedics;  Laterality: Right;   AORTIC VALVE REPLACEMENT N/A 08/22/2015   Procedure: AORTIC VALVE REPLACEMENT (AVR);  Surgeon: Kerin Perna, MD;  Location: [facility] OR;  Service: Open Heart Surgery;  Laterality:  N/A;   ATRIAL FIBRILLATION ABLATION  01/30/2012   PVI by Dr. Johney Frame   ATRIAL FIBRILLATION ABLATION N/A 01/31/2012   Procedure: ATRIAL FIBRILLATION ABLATION;  Surgeon: [name], MD;  Location: [facility] CATH LAB;  Service: Cardiovascular;  Laterality: N/A;   ATRIAL FIBRILLATION ABLATION N/A 04/19/2020   Procedure: ATRIAL FIBRILLATION ABLATION;  Surgeon: [name], MD;  Location: [facility] INVASIVE CV LAB;  Service: Cardiovascular;  Laterality: N/A;   BACK SURGERY     X 3   BIOPSY  01/01/2022   Procedure: BIOPSY;  Surgeon: Kathi Der, MD;  Location: [facility] ENDOSCOPY;  Service: Gastroenterology;;   CARDIAC CATHETERIZATION N/A 08/09/2015   Procedure: Right/Left Heart Cath and Coronary Angiography;  Surgeon: Peter M Swaziland, MD;  Location: [facility] INVASIVE CV LAB;  Service: Cardiovascular;  Laterality: N/A;   CARDIAC CATHETERIZATION N/A 02/05/2016   Procedure: Right/Left Heart Cath and Coronary Angiography;  Surgeon: Corky Crafts, MD;  Location: [facility] INVASIVE CV LAB;  Service: Cardiovascular;  Laterality: N/A;   CARDIAC CATHETERIZATION N/A 04/17/2016   Procedure: Right Heart Cath;  Surgeon: Dolores Patty, MD;  Location: [facility] INVASIVE CV LAB;  Service: Cardiovascular;  Laterality: N/A;   CARDIOVASCULAR STRESS TEST  03/12/2002   EF 48%, NO EVIDENCE OF ISCHEMIA   CARDIOVERSION  01/2012; 03/31/2012   CARDIOVERSION N/A 02/25/2012   Procedure: CARDIOVERSION;  Surgeon: [name], MD;  Location: [facility] CATH LAB;  Service: Cardiovascular;  Laterality: N/A;   CARDIOVERSION N/A 03/31/2012   Procedure: CARDIOVERSION;  Surgeon: [name], MD;  Location: [facility] CATH LAB;  Service: Cardiovascular;  Laterality: N/A;   CARDIOVERSION N/A 11/23/2015   Procedure: CARDIOVERSION;  Surgeon: Laurey Morale, MD;  Location: [facility] ENDOSCOPY;  Service: Cardiovascular;  Laterality: N/A;   CARDIOVERSION N/A 04/08/2016   Procedure: CARDIOVERSION;  Surgeon: Dolores Patty, MD;  Location: [facility] ENDOSCOPY;  Service: Cardiovascular;   Laterality: N/A;   CARDIOVERSION N/A 09/14/2018   Procedure: CARDIOVERSION;  Surgeon: Wendall Stade, MD;  Location: [facility] ENDOSCOPY;  Service: Cardiovascular;  Laterality: N/A;   CARDIOVERSION N/A 10/19/2019   Procedure: CARDIOVERSION;  Surgeon: Dolores Patty, MD;  Location: [facility] ENDOSCOPY;  Service: Cardiovascular;  Laterality: N/A;   CARDIOVERSION N/A 02/25/2020   Procedure: CARDIOVERSION;  Surgeon: Pricilla Riffle, MD;  Location: [facility] ENDOSCOPY;  Service: Cardiovascular;  Laterality: N/A;   CARDIOVERSION N/A 05/25/2020   Procedure: CARDIOVERSION;  Surgeon: Lewayne Bunting, MD;  Location: [facility] ENDOSCOPY;  Service: Cardiovascular;  Laterality: N/A;   CARDIOVERSION N/A 07/10/2020   Procedure: CARDIOVERSION;  Surgeon: Jake Bathe, MD;  Location: [facility] ENDOSCOPY;  Service: Cardiovascular;  Laterality: N/A;   CLIPPING OF ATRIAL APPENDAGE N/A 08/22/2015   Procedure: CLIPPING OF ATRIAL APPENDAGE;  Surgeon: Kerin Perna, MD;  Location: [facility] OR;  Service: Open Heart Surgery;  Laterality: N/A;   COLONOSCOPY WITH PROPOFOL N/A 01/01/2022   Procedure: COLONOSCOPY WITH PROPOFOL;  Surgeon: Kathi Der, MD;  Location: [facility] ENDOSCOPY;  Service: Gastroenterology;  Laterality: N/A;   DOPPLER ECHOCARDIOGRAPHY  03/11/2002   EF 70-75%   FINGER SURGERY Left    Middle finger   FINGER TENDON REPAIR  1980's   "right little finger" (03/31/2012)   FLEXIBLE SIGMOIDOSCOPY N/A 12/30/2021   Procedure: FLEXIBLE SIGMOIDOSCOPY;  Surgeon: Charlott Rakes, MD;  Location: [facility] ENDOSCOPY;  Service: Gastroenterology;  Laterality: N/A;   INSERT / REPLACE / REMOVE PACEMAKER     St Jude   IR RADIOLOGIST EVAL & MGMT  05/06/2019   IR RADIOLOGIST EVAL & MGMT  10/06/2019   IR RADIOLOGIST EVAL & MGMT  10/31/2020   IR RADIOLOGIST EVAL & MGMT  01/18/2021   IR RADIOLOGIST EVAL & MGMT  03/08/2021   IR RADIOLOGIST EVAL & MGMT  06/13/2021   KIDNEY SURGERY Left    LEFT AND RIGHT HEART CATHETERIZATION WITH CORONARY ANGIOGRAM N/A 10/27/2012    Procedure: LEFT AND RIGHT HEART CATHETERIZATION WITH CORONARY ANGIOGRAM;  Surgeon: Pamella Pert, MD;  Location: [facility] CATH LAB;  Service: Cardiovascular;  Laterality: N/A;   LUMBAR DISC SURGERY  1980's X2;  2000's   MAZE N/A 08/22/2015   Procedure: MAZE;  Surgeon: Kerin Perna, MD;  Location: [facility] OR;  Service: Open Heart Surgery;  Laterality: N/A;   PACEMAKER INSERTION  03/31/2012   STJ Accent DR pacemaker implanted by Dr Johney Frame   PERMANENT PACEMAKER INSERTION N/A 03/31/2012   Procedure: PERMANENT PACEMAKER INSERTION;  Surgeon: [name], MD;  Location: [facility] CATH LAB;  Service: Cardiovascular;  Laterality: N/A;   PPM GENERATOR CHANGEOUT N/A 08/08/2020   Procedure: PPM GENERATOR CHANGEOUT;  Surgeon: [name], MD;  Location: [facility] INVASIVE CV LAB;  Service: Cardiovascular;  Laterality: N/A;   RADIOLOGY WITH ANESTHESIA Left 02/14/2021   Procedure: RADIOLOGY WITH ANESTHESIA LEFT RENAL CRYOABLATION;  Surgeon: Irish Lack, MD;  Location: [facility] ORS;  Service: Radiology;  Laterality: Left;   TEE WITHOUT CARDIOVERSION  01/30/2012   Procedure: TRANSESOPHAGEAL  ECHOCARDIOGRAM (TEE);  Surgeon: Laurey Morale, MD;  Location: [facility] ENDOSCOPY;  Service: Cardiovascular;  Laterality: N/A;  ablation next day   TEE WITHOUT CARDIOVERSION N/A 08/22/2015   Procedure: TRANSESOPHAGEAL ECHOCARDIOGRAM (TEE);  Surgeon: Kerin Perna, MD;  Location: [facility] OR;  Service: Open Heart Surgery;  Laterality: N/A;   TEE WITHOUT CARDIOVERSION N/A 10/19/2019   Procedure: TRANSESOPHAGEAL ECHOCARDIOGRAM (TEE);  Surgeon: Dolores Patty, MD;  Location: [facility] ENDOSCOPY;  Service: Cardiovascular;  Laterality: N/A;   US ECHOCARDIOGRAPHY  08/07/2009   EF 55-60%    Family History  Problem Relation Age of Onset   Heart failure Mother    Stroke Father    Neuropathy Neg Hx    Social History:  reports that he quit smoking about 31 years ago. His smoking use included cigarettes. He started smoking about 61 years ago. He has a  25.00 pack-year smoking history. He has never been exposed to tobacco smoke. He has never used smokeless tobacco. He reports that he does not currently use alcohol. He reports current drug use. Drug: Marijuana.  Allergies:  Allergies  Allergen Reactions   Meloxicam Rash and Other (See Comments)    Red Man Syndrome, also   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 syndrome 09/02/15, resolved with diphenhydramine and slowing of rate    No medications prior to admission.    No results found. However, due to the size of the patient record, not all encounters were searched. Please check Results Review for a complete set of results. No results found.  Review of Systems  All other systems reviewed and are negative.   There were no vitals taken for this visit. Physical Exam  Patient is alert, oriented, no adenopathy, well-dressed, normal affect, normal respiratory effort. There is no cellulitis no drainage.  Patient does have superficial wound dehiscence there is no depth to the wound and laterally there has been good epithelialization.  Will need to continue the dressing changes.  No indication for antibiotics at this time. Assessment/Plan Assessment: Dehiscence right below-knee amputation.  Plan: Will plan for revision of the amputation.  Risk and benefits were discussed including risk of the wound not healing need for additional surgery.  Patient states he understands wished to proceed at this time.  [name], MD 10/09/2022, 6:42 AM

## 2022-10-09 NOTE — Anesthesia Postprocedure Evaluation (Signed)
Anesthesia Post Note  Patient: [name]  Procedure(s) Performed: REVISION RIGHT BELOW KNEE AMPUTATION (Right: Leg Lower)     Patient location during evaluation: PACU Anesthesia Type: General Level of consciousness: awake and alert Pain management: pain level controlled Vital Signs Assessment: post-procedure vital signs reviewed and stable Respiratory status: spontaneous breathing, nonlabored ventilation, respiratory function stable and patient connected to nasal cannula oxygen Cardiovascular status: blood pressure returned to baseline and stable Postop Assessment: no apparent nausea or vomiting Anesthetic complications: no  No notable events documented.  Last Vitals:  Vitals:   10/09/22 1330 10/09/22 1357  BP: 138/73 (!) 158/88  Pulse: 63 (!) 59  Resp: 14 17  Temp:  36.4 C  SpO2: 96% 97%    Last Pain:  Vitals:   10/09/22 1357  TempSrc: Oral  PainSc:                  [name]

## 2022-10-09 NOTE — Transfer of Care (Signed)
Immediate Anesthesia Transfer of Care Note  Patient: [name]  Procedure(s) Performed: REVISION RIGHT BELOW KNEE AMPUTATION (Right: Leg Lower)  Patient Location: PACU  Anesthesia Type:General  Level of Consciousness: drowsy and patient cooperative  Airway & Oxygen Therapy: Patient Spontanous Breathing  Post-op Assessment: Report given to RN and Post -op Vital signs reviewed and stable  Post vital signs: Reviewed and stable  Last Vitals:  Vitals Value Taken Time  BP 159/87 10/09/22 1209  Temp    Pulse 62 10/09/22 1212  Resp 16 10/09/22 1212  SpO2 94 % 10/09/22 1212  Vitals shown include unvalidated device data.  Last Pain:  Vitals:   10/09/22 0936  TempSrc:   PainSc: 7          Complications: No notable events documented.

## 2022-10-09 NOTE — Progress Notes (Signed)
CBG 129. No new orders from Dr. Armond Hang. Will continue to monitor.

## 2022-10-10 ENCOUNTER — Encounter (HOSPITAL_COMMUNITY): Payer: Self-pay | Admitting: Orthopedic Surgery

## 2022-10-10 LAB — GLUCOSE, CAPILLARY
Glucose-Capillary: 119 mg/dL — ABNORMAL HIGH (ref 70–99)
Glucose-Capillary: 123 mg/dL — ABNORMAL HIGH (ref 70–99)
Glucose-Capillary: 120 mg/dL — ABNORMAL HIGH (ref 70–99)

## 2022-10-10 LAB — AEROBIC/ANAEROBIC CULTURE W GRAM STAIN (SURGICAL/DEEP WOUND)

## 2022-10-10 MED ORDER — PROSOURCE PLUS PO LIQD
30.0000 mL | Freq: Three times a day (TID) | ORAL | Status: DC
  Administered 2022-10-10 – 2022-10-12 (×5): 30 mL via ORAL
  Filled 2022-10-10 (×5): qty 30

## 2022-10-10 NOTE — Progress Notes (Signed)
Physical Therapy Evaluation Patient Details Name: [name] MRN: [id number] DOB: [birth year] Today's Date: 10/10/2022  History of Present Illness  72 yo male returns on 6/12 to hosp with onset of dehiscence to R BK stump after 4 weeks post op originally, and have determined to revise surgery on 6/12.  Pt is painful, wearing wound vac and referred to PT for progressing mobility.  PMHx: COPD, afib, fibromyalgia, CHF, HTN, HLD, pacer, back surgery, RLS, 2nd degree AV block, asthma, fatty liver, mitral regurg,  Clinical Impression  Pt was seen for initial evaluation after revising his R BK amputation.  He is in a lot of pain and cannot get it fully controlled for an attempt to walk.  Pt did show PT how well he stands, but is going to need help to progress to gait in a setting with rehab for recovery of strength and balance.  Follow acutely for goals of PT as outlined below.  Recommend <3 hours a day inpt care and rehab.       Recommendations for follow up therapy are one component of a multi-disciplinary discharge planning process, led by the attending physician.  Recommendations may be updated based on patient status, additional functional criteria and insurance authorization.  Follow Up Recommendations       Assistance Recommended at Discharge Frequent or constant Supervision/Assistance  Patient can return home with the following  A lot of help with walking and/or transfers;A little help with bathing/dressing/bathroom;Assistance with cooking/housework;Assist for transportation;Help with stairs or ramp for entrance    Equipment Recommendations None recommended by PT  Recommendations for Other Services  Rehab consult    Functional Status Assessment Patient has had a recent decline in their functional status and demonstrates the ability to make significant improvements in function in a reasonable and predictable amount of time.     Precautions / Restrictions Precautions Precautions:  Fall Precaution Comments: wound vac RLE Restrictions Weight Bearing Restrictions: Yes RLE Weight Bearing: Non weight bearing Other Position/Activity Restrictions: elevate per orders      Mobility  Bed Mobility Overal bed mobility: Needs Assistance Bed Mobility: Supine to Sit, Sit to Supine     Supine to sit: Min guard, HOB elevated Sit to supine: Min guard   General bed mobility comments: using bed features to move    Transfers Overall transfer level: Needs assistance Equipment used: None Transfers: Sit to/from Stand Sit to Stand: Supervision           General transfer comment: hands on bed rails and stood with S on side of bed    Ambulation/Gait         Gait velocity: reduced Gait velocity interpretation: <1.31 ft/sec, indicative of household ambulator   General Gait Details: deferred due to pain  Stairs            Wheelchair Mobility    Modified Rankin (Stroke Patients Only)       Balance Overall balance assessment: Needs assistance Sitting-balance support: Feet supported Sitting balance-Leahy Scale: Good     Standing balance support: Bilateral upper extremity supported Standing balance-Leahy Scale: Poor                               Pertinent Vitals/Pain Pain Assessment Pain Assessment: Faces Faces Pain Scale: Hurts even more Pain Location: R LE surgery site Pain Descriptors / Indicators: Guarding, Grimacing Pain Intervention(s): Limited activity within patient's tolerance, Monitored during session, Premedicated before session, Repositioned  Home Living Family/patient expects to be discharged to:: Private residence Living Arrangements: Spouse/significant other Available Help at Discharge: Available 24 hours/day Type of Home: House Home Access: Ramped entrance       Home Layout: One level Home Equipment: Agricultural consultant (2 wheels);Wheelchair - Lawyer Comments: several large dogs    Prior  Function Prior Level of Function : Needs assist       Physical Assist : Mobility (physical) Mobility (physical): Transfers;Gait   Mobility Comments: wheelchair for mobility mostly ADLs Comments: wife helps him     Hand Dominance   Dominant Hand: Right    Extremity/Trunk Assessment   Upper Extremity Assessment Upper Extremity Assessment: Defer to OT evaluation    Lower Extremity Assessment Lower Extremity Assessment: Generalized weakness;RLE deficits/detail RLE Deficits / Details: new BK amp with pt relying on wheelchair at home, RW at times; RLE: Unable to fully assess due to pain RLE Coordination: decreased gross motor    Cervical / Trunk Assessment Cervical / Trunk Assessment: Kyphotic  Communication   Communication: No difficulties  Cognition Arousal/Alertness: Awake/alert Behavior During Therapy: WFL for tasks assessed/performed Overall Cognitive Status: Within Functional Limits for tasks assessed                                 General Comments: in pain and finally agreed to stand        General Comments General comments (skin integrity, edema, etc.): Pt is limited by pain today and will recommend premedicating him to get through visit with gait next session    Exercises     Assessment/Plan    PT Assessment Patient needs continued PT services  PT Problem List Decreased strength;Decreased range of motion;Decreased activity tolerance;Decreased balance;Decreased mobility;Decreased coordination;Decreased safety awareness;Decreased skin integrity;Pain       PT Treatment Interventions DME instruction;Gait training;Stair training;Functional mobility training;Therapeutic activities;Therapeutic exercise;Balance training;Neuromuscular re-education;Patient/family education    PT Goals (Current goals can be found in the Care Plan section)  Acute Rehab PT Goals Patient Stated Goal: to heal and get fitting for prosthetic PT Goal Formulation: With  patient Time For Goal Achievement: 10/24/22 Potential to Achieve Goals: Good    Frequency Min 4X/week     Co-evaluation               AM-PAC PT "6 Clicks" Mobility  Outcome Measure Help needed turning from your back to your side while in a flat bed without using bedrails?: A Little Help needed moving from lying on your back to sitting on the side of a flat bed without using bedrails?: A Little Help needed moving to and from a bed to a chair (including a wheelchair)?: A Little Help needed standing up from a chair using your arms (e.g., wheelchair or bedside chair)?: A Little Help needed to walk in hospital room?: A Lot Help needed climbing 3-5 steps with a railing? : Total 6 Click Score: 15    End of Session Equipment Utilized During Treatment: Gait belt Activity Tolerance: Patient limited by fatigue;Patient limited by pain Patient left: in bed;with call bell/phone within reach;with bed alarm set Nurse Communication: Mobility status PT Visit Diagnosis: Unsteadiness on feet (R26.81);Muscle weakness (generalized) (M62.81);Pain Pain - Right/Left: Right Pain - part of body: Leg    Time: 1610-9604 PT Time Calculation (min) (ACUTE ONLY): 16 min   Charges:   PT Evaluation $PT Eval Moderate Complexity: 1 Mod  Ivar Drape 10/10/2022, 4:19 PM  Samul Dada, PT PhD Acute Rehab Dept. Number: [facility] [id number] and [facility] [phone]

## 2022-10-10 NOTE — Progress Notes (Signed)
Initial Nutrition Assessment  DOCUMENTATION CODES:   Not applicable  INTERVENTION:  Liberalize diet to regular for increased nutrition options to optimize PO intake  Juven BID, each packet provides 95 calories, 2.5 grams of protein (collagen), and 9.8 grams of carbohydrate (3 grams sugar) + micronutrients to support wound healing  30 ml ProSource Plus TID, each supplement provides 100 kcals and 15 grams protein.   Continue Vitamin C and Zinc supplements  NUTRITION DIAGNOSIS:   Increased nutrient needs related to post-op healing as evidenced by estimated needs.  GOAL:   Patient will meet greater than or equal to 90% of their needs  MONITOR:   PO intake, Supplement acceptance, Labs, Weight trends, Diet advancement  REASON FOR ASSESSMENT:   Malnutrition Screening Tool    ASSESSMENT:   Pt admitted with dehiscence of R BKA. PMH significant for Htn, HLD, pacemaker, hypothyroidism, afib, GERD, CHF, COPD, T2DM.   6/12 - s/p revision of R BKA +VAC  Pt in good spirits. He state that he did not eat much yesterday d/t lack of appetite but otherwise has remained with a good appetite and PO intake. He is able to chew/swallow without difficulty. He recalls eating 3 meals per day with balance of nutrients (protein, vegetables, carbohydrates).   Meal completions: 6/12: 100% lunch 6/13: 100% breakfast and lunch  Feels that his weight has remained stable. Reviewed weight history. Pt's weight has remained at 81.2 kg since 04/25. No significant recent weight loss noted.   Medications: Vitamin C1000mg  daily, colace, jardiance, protonix, zinc 220mg  daily  Labs: BUN 32, Cr 1.84, GFR 38, CBG's 116-132 x24 hours  NUTRITION - FOCUSED PHYSICAL EXAM:  Flowsheet Row Most Recent Value  Orbital Region No depletion  Upper Arm Region Mild depletion  Thoracic and Lumbar Region No depletion  Buccal Region No depletion  Temple Region Mild depletion  Clavicle Bone Region No depletion  Clavicle  and Acromion Bone Region No depletion  Scapular Bone Region No depletion  Dorsal Hand Moderate depletion  Patellar Region Severe depletion  Anterior Thigh Region Severe depletion  Posterior Calf Region Mild depletion  [R BKA]  Edema (RD Assessment) None  Hair Reviewed  Eyes Reviewed  Mouth Other (Comment)  [wears partial dentures]  Skin Reviewed  Nails Reviewed       Diet Order:   Diet Order             Diet Carb Modified Fluid consistency: Thin; Room service appropriate? Yes  Diet effective now                   EDUCATION NEEDS:   Education needs have been addressed  Skin:  Skin Assessment: Skin Integrity Issues: Skin Integrity Issues:: Wound VAC Wound Vac: L BKA  Last BM:  6/12 per pt   Height:   Ht Readings from Last 1 Encounters:  10/09/22 5\' 11"  (1.803 m)    Weight:   Wt Readings from Last 1 Encounters:  10/09/22 81.2 kg   BMI:  Body mass index is 24.97 kg/m.  Estimated Nutritional Needs:   Kcal:  2000-2200  Protein:  110-125g  Fluid:  >/=2L  Drusilla Kanner, RDN, LDN Clinical Nutrition

## 2022-10-10 NOTE — Progress Notes (Signed)
Patient ID: [name], male   DOB: 1951-03-12, 72 y.o.   MRN: [id number] Patient is postoperative day 1 revision transtibial amputation.  Patient is complaining of increased pain.  Reviewed his medications.  He is on a high dose of Neurontin and has kidney disease so cannot add Toradol.  Patient's manipulation of the wound has caused the wound VAC to lose its seal.  There is no drainage in the wound VAC canister.

## 2022-10-10 NOTE — Progress Notes (Signed)
Inpatient Rehabilitation Admissions Coordinator   Per therapy recommendations patient was screened for CIR candidacy by Ottie Glazier RN MSN. Current payor trends with [employer] medicare are unlikley to approve a CIR/AIR level rehab for this diagnosis. I will not place a Rehab Conuslt at this time. Recommend other rehab venues to be pursued. Noted recent SNF after BKA.  Ottie Glazier, RN, MSN Rehab Admissions Coordinator [phone] 10/10/2022 5:10 PM

## 2022-10-11 LAB — AEROBIC/ANAEROBIC CULTURE W GRAM STAIN (SURGICAL/DEEP WOUND)

## 2022-10-11 MED ORDER — CEFAZOLIN SODIUM-DEXTROSE 2-4 GM/100ML-% IV SOLN
2.0000 g | Freq: Three times a day (TID) | INTRAVENOUS | Status: DC
  Administered 2022-10-11 – 2022-10-12 (×3): 2 g via INTRAVENOUS
  Filled 2022-10-11 (×3): qty 100

## 2022-10-11 NOTE — Progress Notes (Signed)
Physical Therapy Treatment Patient Details Name: [name] MRN: [id number] DOB: 03/15/1951 Today's Date: 10/11/2022   History of Present Illness 72 yo male returns on 6/12 to hosp with onset of dehiscence to R BK stump after 4 weeks post op originally, and have determined to revise surgery on 6/12.  Pt is painful, wearing wound vac and referred to PT for progressing mobility.  PMHx: COPD, afib, fibromyalgia, CHF, HTN, HLD, pacer, back surgery, RLS, 2nd degree AV block, asthma, fatty liver, mitral regurg,    PT Comments    Pt received in supine and agreeable to session. Pt reporting decreased pain this session and better ability to participate in mobility tasks. Pt able to stand from EOB and recliner with supervision. Pt able to tolerate 2 short gait trials with min guard for safety and pt demonstrating steady hop-to gait with good BUE support. Pt limited by impaired activity tolerance and fatigue. Pt noted to have R knee flexion contracture and was educated on importance of resting R knee in extension. Pt continues to benefit from PT services to progress toward functional mobility goals.     Recommendations for follow up therapy are one component of a multi-disciplinary discharge planning process, led by the attending physician.  Recommendations may be updated based on patient status, additional functional criteria and insurance authorization.     Assistance Recommended at Discharge Frequent or constant Supervision/Assistance  Patient can return home with the following A lot of help with walking and/or transfers;A little help with bathing/dressing/bathroom;Assistance with cooking/housework;Assist for transportation;Help with stairs or ramp for entrance   Equipment Recommendations  None recommended by PT    Recommendations for Other Services       Precautions / Restrictions Precautions Precautions: Fall Precaution Comments: wound vac RLE Restrictions Weight Bearing Restrictions:  Yes RLE Weight Bearing: Non weight bearing Other Position/Activity Restrictions: elevate per orders     Mobility  Bed Mobility Overal bed mobility: Needs Assistance Bed Mobility: Supine to Sit     Supine to sit: HOB elevated, Min guard     General bed mobility comments: Increased time and use of bedrail    Transfers Overall transfer level: Needs assistance Equipment used: Rolling walker (2 wheels) Transfers: Sit to/from Stand Sit to Stand: Supervision           General transfer comment: Cues for hand placement and pt with good power up    Ambulation/Gait Ambulation/Gait assistance: Min guard Gait Distance (Feet): 20 Feet (x2) Assistive device: Rolling walker (2 wheels) Gait Pattern/deviations:  (hop-to) Gait velocity: reduced     General Gait Details: Pt demonstrating steady hop-to pattern with good BUE support. Cues to not step in front of RW       Balance Overall balance assessment: Needs assistance Sitting-balance support: Feet supported Sitting balance-Leahy Scale: Good Sitting balance - Comments: sitting EOB   Standing balance support: Bilateral upper extremity supported, Reliant on assistive device for balance, During functional activity Standing balance-Leahy Scale: Fair Standing balance comment: with RW support                            Cognition Arousal/Alertness: Awake/alert Behavior During Therapy: WFL for tasks assessed/performed Overall Cognitive Status: Within Functional Limits for tasks assessed  Exercises      General Comments        Pertinent Vitals/Pain Pain Assessment Pain Assessment: 0-10 Pain Score: 6  Pain Location: R LE surgery site Pain Descriptors / Indicators: Guarding, Grimacing Pain Intervention(s): Monitored during session, Repositioned     PT Goals (current goals can now be found in the care plan section) Acute Rehab PT Goals Patient Stated  Goal: to heal and get fitting for prosthetic PT Goal Formulation: With patient Time For Goal Achievement: 10/24/22 Potential to Achieve Goals: Good Progress towards PT goals: Progressing toward goals    Frequency    Min 4X/week      PT Plan Current plan remains appropriate       AM-PAC PT "6 Clicks" Mobility   Outcome Measure  Help needed turning from your back to your side while in a flat bed without using bedrails?: A Little Help needed moving from lying on your back to sitting on the side of a flat bed without using bedrails?: A Little Help needed moving to and from a bed to a chair (including a wheelchair)?: A Little Help needed standing up from a chair using your arms (e.g., wheelchair or bedside chair)?: A Little Help needed to walk in hospital room?: A Little Help needed climbing 3-5 steps with a railing? : Total 6 Click Score: 16    End of Session Equipment Utilized During Treatment: Gait belt Activity Tolerance: Patient tolerated treatment well Patient left: in chair;with call bell/phone within reach Nurse Communication: Mobility status PT Visit Diagnosis: Unsteadiness on feet (R26.81);Muscle weakness (generalized) (M62.81);Pain Pain - Right/Left: Right Pain - part of body: Leg     Time: 1610-9604 PT Time Calculation (min) (ACUTE ONLY): 17 min  Charges:  $Gait Training: 8-22 mins                     Johny Shock, PTA Acute Rehabilitation Services Secure Chat Preferred  [phone]    Johny Shock 10/11/2022, 12:31 PM

## 2022-10-11 NOTE — Plan of Care (Signed)
  Problem: Pain Management: Goal: Pain level will decrease with appropriate interventions Outcome: Progressing   Problem: Activity: Goal: Risk for activity intolerance will decrease Outcome: Progressing   Problem: Nutrition: Goal: Adequate nutrition will be maintained Outcome: Progressing   Problem: Coping: Goal: Level of anxiety will decrease Outcome: Progressing   

## 2022-10-11 NOTE — Progress Notes (Signed)
Patient ID: [name], male   DOB: October 22, 1950, 72 y.o.   MRN: [id number] Patient states he is more comfortable today.  Will plan to reinforce the wound VAC dressing and possible discharge on Saturday on Keflex.

## 2022-10-12 LAB — AEROBIC/ANAEROBIC CULTURE W GRAM STAIN (SURGICAL/DEEP WOUND): Gram Stain: NONE SEEN

## 2022-10-12 MED ORDER — CEPHALEXIN 500 MG PO CAPS: 500.0000 mg | ORAL_CAPSULE | Freq: Three times a day (TID) | ORAL | 0 refills | Status: DC

## 2022-10-12 MED ORDER — OXYCODONE-ACETAMINOPHEN 5-325 MG PO TABS: 1.0000 | ORAL_TABLET | ORAL | 0 refills | Status: DC | PRN

## 2022-10-12 NOTE — Plan of Care (Signed)
  Problem: Pain Management: Goal: Pain level will decrease with appropriate interventions Outcome: Progressing   Problem: Skin Integrity: Goal: Demonstration of wound healing without infection will improve Outcome: Progressing   Problem: Education: Goal: Knowledge of General Education information will improve Description: Including pain rating scale, medication(s)/side effects and non-pharmacologic comfort measures Outcome: Progressing

## 2022-10-12 NOTE — Discharge Summary (Signed)
Discharge Diagnoses:  Principal Problem:   Below-knee amputation of right lower extremity (HCC) Active Problems:   Dehiscence of amputation stump of right lower extremity (HCC)   Surgeries: Procedure(s): REVISION RIGHT BELOW KNEE AMPUTATION on 10/09/2022    Consultants:   Discharged Condition: Improved  Hospital Course: [name] is an 72 y.o. male who was admitted 10/09/2022 with a chief complaint of dehiscence right below-knee amputation, with a final diagnosis of Dehiscence Right Below Knee Amputation.  Patient was brought to the operating room on 10/09/2022 and underwent Procedure(s): REVISION RIGHT BELOW KNEE AMPUTATION.    Patient was given perioperative antibiotics:  Anti-infectives (From admission, onward)    Start     Dose/Rate Route Frequency Ordered Stop   10/12/22 0000  cephALEXin (KEFLEX) 500 MG capsule        500 mg Oral 3 times daily 10/12/22 0927 10/22/22 2359   10/11/22 1245  ceFAZolin (ANCEF) IVPB 2g/100 mL premix        2 g 200 mL/hr over 30 Minutes Intravenous Every 8 hours 10/11/22 1158     10/09/22 1930  ceFAZolin (ANCEF) IVPB 2g/100 mL premix        2 g 200 mL/hr over 30 Minutes Intravenous Every 8 hours 10/09/22 1409 10/10/22 1241   10/09/22 1144  vancomycin (VANCOCIN) powder  Status:  Discontinued          As needed 10/09/22 1144 10/09/22 1203   10/09/22 0930  ceFAZolin (ANCEF) IVPB 2g/100 mL premix        2 g 200 mL/hr over 30 Minutes Intravenous On call to O.R. 10/09/22 0915 10/09/22 1156   10/09/22 0918  ceFAZolin (ANCEF) 2-4 GM/100ML-% IVPB       Note to Pharmacy: Darrick Huntsman: cabinet override      10/09/22 0918 10/09/22 1135     .  Patient was given sequential compression devices, early ambulation, and aspirin for DVT prophylaxis.  Recent vital signs: Patient Vitals for the past 24 hrs:  BP Temp Temp src Pulse Resp SpO2  10/12/22 0828 -- -- -- -- -- 96 %  10/12/22 0507 113/72 99.3 F (37.4 C) Oral 64 17 95 %  10/11/22 2109  114/68 99.5 F (37.5 C) Oral 60 17 97 %  10/11/22 1501 111/69 97.9 F (36.6 C) Oral 60 17 98 %  .  Recent laboratory studies: No results found.  Discharge Medications:   Allergies as of 10/12/2022       Reactions   Meloxicam Rash, Other (See Comments)   Red Man Syndrome, also   Vancomycin Other (See Comments)   Red Man's syndrome 09/02/15, resolved with diphenhydramine and slowing of rate        Medication List     TAKE these medications    albuterol 108 (90 Base) MCG/ACT inhaler Commonly known as: VENTOLIN HFA Inhale 2 puffs into the lungs every 6 (six) hours as needed for wheezing or shortness of breath.   amLODipine 5 MG tablet Commonly known as: NORVASC Take 5 mg by mouth in the morning.   apixaban 5 MG Tabs tablet Commonly known as: ELIQUIS Take 1 tablet (5 mg total) by mouth 2 (two) times daily.   bisoprolol 5 MG tablet Commonly known as: ZEBETA Take 5 mg by mouth daily.   Breztri Aerosphere 160-9-4.8 MCG/ACT Aero Generic drug: Budeson-Glycopyrrol-Formoterol Inhale 2 puffs into the lungs in the morning and at bedtime.   Cal-Mag-Zinc-D Tabs Take 1 tablet by mouth daily.   cephALEXin 500 MG capsule Commonly  known as: KEFLEX Take 1 capsule (500 mg total) by mouth 3 (three) times daily for 10 days.   doxycycline 100 MG tablet Commonly known as: VIBRA-TABS Take 1 tablet (100 mg total) by mouth 2 (two) times daily.   empagliflozin 10 MG Tabs tablet Commonly known as: Jardiance Take 1 tablet (10 mg total) by mouth daily.   gabapentin 300 MG capsule Commonly known as: NEURONTIN Take 600 mg by mouth 3 (three) times daily. for neuropathy   levothyroxine 175 MCG tablet Commonly known as: SYNTHROID Take 175 mcg by mouth daily before breakfast.   montelukast 10 MG tablet Commonly known as: SINGULAIR TAKE 1 TABLET(10 MG) BY MOUTH AT BEDTIME   Mucinex DM Maximum Strength 60-1200 MG Tb12 Take 1 tablet by mouth daily.   multivitamin with minerals Tabs  tablet Take 1 tablet by mouth daily.   ondansetron 4 MG tablet Commonly known as: Zofran Take 1 tablet (4 mg total) by mouth every 8 (eight) hours as needed for nausea or vomiting.   oxyCODONE-acetaminophen 5-325 MG tablet Commonly known as: PERCOCET/ROXICET Take 1 tablet by mouth every 4 (four) hours as needed.   OXYGEN Inhale 2 L/min into the lungs See admin instructions. 2 L/min at bedtime and 2 L/min throughout the day as needed for shortness of breath   pantoprazole 40 MG tablet Commonly known as: PROTONIX Take 40 mg by mouth daily before breakfast.   polyethylene glycol powder 17 GM/SCOOP powder Commonly known as: GLYCOLAX/MIRALAX Take 17 g by mouth daily as needed for mild constipation. What changed: when to take this   potassium chloride SA 20 MEQ tablet Commonly known as: KLOR-CON M Take 20 mEq by mouth See admin instructions. Take 20 meq twice daily every other day   predniSONE 5 MG tablet Commonly known as: DELTASONE Take 0.5 tablets (2.5 mg total) by mouth daily with breakfast.   rOPINIRole 1 MG tablet Commonly known as: REQUIP Take 1 tablet (1 mg total) by mouth in the morning and at bedtime.   rosuvastatin 10 MG tablet Commonly known as: CRESTOR Take 10 mg by mouth at bedtime.   senna-docusate 8.6-50 MG tablet Commonly known as: Senokot-S Take 1 tablet by mouth 2 (two) times daily.   spironolactone 25 MG tablet Commonly known as: ALDACTONE Take 1 tablet (25 mg total) by mouth daily.   sucralfate 1 g tablet Commonly known as: CARAFATE Take 1 g by mouth 3 (three) times daily before meals.   Systane Ultra PF 0.4-0.3 % Soln Generic drug: Polyethyl Glyc-Propyl Glyc PF Place 1 drop into both eyes 2 (two) times daily as needed (dry eyes).   TIGER BALM ARTHRITIS RUB EX Apply 1 application topically as needed (for leg pain).   torsemide 10 MG tablet Commonly known as: DEMADEX Take 15 mg by mouth every other day.   traZODone 50 MG tablet Commonly  known as: DESYREL Take 50 mg by mouth at bedtime.   Vitamin D 50 MCG (2000 UT) tablet Take 8,000 Units by mouth daily.        Diagnostic Studies: No results found.  Patient benefited maximally from their hospital stay and there were no complications.     Disposition: Discharge disposition: 01-Home or Self Care      Discharge Instructions     Call MD / Call 911   Complete by: As directed    If you experience chest pain or shortness of breath, CALL 911 and be transported to the hospital emergency room.  If you develope a  fever above 101 F, pus (white drainage) or increased drainage or redness at the wound, or calf pain, call your surgeon's office.   Constipation Prevention   Complete by: As directed    Drink plenty of fluids.  Prune juice may be helpful.  You may use a stool softener, such as Colace (over the counter) 100 mg twice a day.  Use MiraLax (over the counter) for constipation as needed.   Diet - low sodium heart healthy   Complete by: As directed    Increase activity slowly as tolerated   Complete by: As directed    Negative Pressure Wound Therapy - Incisional   Complete by: As directed    At time of discharge connect the wound VAC dressing to the Praveena plus portable wound VAC pump.  If the Praveena pump states there is a leakage then please remove the wound VAC dressing apply 4 x 4 gauze plus Ace wrap for discharge.   Post-operative opioid taper instructions:   Complete by: As directed    POST-OPERATIVE OPIOID TAPER INSTRUCTIONS: It is important to wean off of your opioid medication as soon as possible. If you do not need pain medication after your surgery it is ok to stop day one. Opioids include: Codeine, Hydrocodone(Norco, Vicodin), Oxycodone(Percocet, oxycontin) and hydromorphone amongst others.  Long term and even short term use of opiods can cause: Increased pain response Dependence Constipation Depression Respiratory depression And more.   Withdrawal symptoms can include Flu like symptoms Nausea, vomiting And more Techniques to manage these symptoms Hydrate well Eat regular healthy meals Stay active Use relaxation techniques(deep breathing, meditating, yoga) Do Not substitute Alcohol to help with tapering If you have been on opioids for less than two weeks and do not have pain than it is ok to stop all together.  Plan to wean off of opioids This plan should start within one week post op of your joint replacement. Maintain the same interval or time between taking each dose and first decrease the dose.  Cut the total daily intake of opioids by one tablet each day Next start to increase the time between doses. The last dose that should be eliminated is the evening dose.          Follow-up Information     [name], MD Follow up in 1 week(s).   Specialty: Orthopedic Surgery Contact information: [address] [place] [address] [phone]                  Signed: [name] 10/12/2022, 9:27 AM

## 2022-10-12 NOTE — Progress Notes (Signed)
Patient ID: [name], male   DOB: [birth year], 72 y.o.   MRN: [id number] The wound VAC dressing was rewrapped this still does not have a perfect seal.  If the Praveena pump shows a leakage then would remove the wound VAC dressing and discharged with a dry dressing.  Orders placed for discharge to home today.

## 2022-10-12 NOTE — Progress Notes (Signed)
Pt discharged to home. DC instructions given with wife at bedside. No concerns voiced. Wound vac transitioned to preveena. Leak noted. Vac changed to another unit, but continued to beep with leak symbol (disruption in flow). Vac dressing removed and gauze dsg applied with compression wrap over gauze dsg, per surgeon's order. Pt tolerated with a significant amount of pain. Pt medicated after dsg change. Left unit in wheelchair pushed by [place], NT. Left in stable condition.

## 2022-10-13 ENCOUNTER — Other Ambulatory Visit: Payer: Self-pay | Admitting: Orthopedic Surgery

## 2022-10-13 MED ORDER — SULFAMETHOXAZOLE-TRIMETHOPRIM 800-160 MG PO TABS: 1.0000 | ORAL_TABLET | Freq: Two times a day (BID) | ORAL | 0 refills | Status: DC

## 2022-10-14 ENCOUNTER — Encounter: Payer: Medicare Other | Admitting: Orthopedic Surgery

## 2022-10-14 ENCOUNTER — Telehealth: Payer: Self-pay

## 2022-10-14 NOTE — Telephone Encounter (Signed)
I called and sw pt's ife and advised of message below. Also sch an appt for Monday 10/21/2022 to call with any questions or concerns.

## 2022-10-14 NOTE — Telephone Encounter (Signed)
-----   Message from [name], MD sent at 10/13/2022  9:45 AM EDT ----- Call patient.  Stop the doxycycline and start a prescription for Bactrim DS, this was called in. ----- Message ----- From: Interface, Lab In [place] Sent: 10/12/2022   2:57 PM EDT To: [name], MD

## 2022-10-15 ENCOUNTER — Telehealth: Payer: Self-pay | Admitting: Orthopedic Surgery

## 2022-10-15 NOTE — Telephone Encounter (Signed)
Pt's wife [name] states pt has a staf infection and they are asking is it anyway pt can get a IV treatment. Please call pt at [phone].

## 2022-10-15 NOTE — Telephone Encounter (Signed)
Pt had cultures done on knee. Showed staf, but on Bactrim DS, told to stop doxy. Wants to know if can be put on IV abx treatment.

## 2022-10-16 ENCOUNTER — Telehealth: Payer: Self-pay | Admitting: Physical Medicine and Rehabilitation

## 2022-10-16 NOTE — Telephone Encounter (Signed)
Patient's wife called asked for a call back to schedule an appointment. The number to contact [name] is [phone]

## 2022-10-16 NOTE — Telephone Encounter (Signed)
SW pt's wife. She will check with pt and see how he wants to pursue. She will call us back if they want the referral.

## 2022-10-17 ENCOUNTER — Inpatient Hospital Stay (HOSPITAL_COMMUNITY)
Admission: EM | Admit: 2022-10-17 | Discharge: 2022-10-19 | DRG: 565 | Disposition: A | Discharge status: Home Health | Payer: Medicare Other | Attending: Internal Medicine | Admitting: Internal Medicine

## 2022-10-17 ENCOUNTER — Encounter (HOSPITAL_COMMUNITY): Payer: Self-pay | Admitting: Family Medicine

## 2022-10-17 ENCOUNTER — Other Ambulatory Visit: Payer: Self-pay

## 2022-10-17 ENCOUNTER — Emergency Department (HOSPITAL_COMMUNITY): Payer: Medicare Other

## 2022-10-17 DIAGNOSIS — L03115 Cellulitis of right lower limb: Secondary | ICD-10-CM | POA: Diagnosis present

## 2022-10-17 DIAGNOSIS — G2581 Restless legs syndrome: Secondary | ICD-10-CM | POA: Diagnosis present

## 2022-10-17 DIAGNOSIS — M1990 Unspecified osteoarthritis, unspecified site: Secondary | ICD-10-CM | POA: Diagnosis present

## 2022-10-17 DIAGNOSIS — T8743 Infection of amputation stump, right lower extremity: Principal | ICD-10-CM | POA: Diagnosis present

## 2022-10-17 DIAGNOSIS — E039 Hypothyroidism, unspecified: Secondary | ICD-10-CM | POA: Diagnosis not present

## 2022-10-17 DIAGNOSIS — G4733 Obstructive sleep apnea (adult) (pediatric): Secondary | ICD-10-CM | POA: Diagnosis present

## 2022-10-17 DIAGNOSIS — L309 Dermatitis, unspecified: Secondary | ICD-10-CM | POA: Diagnosis present

## 2022-10-17 DIAGNOSIS — Y835 Amputation of limb(s) as the cause of abnormal reaction of the patient, or of later complication, without mention of misadventure at the time of the procedure: Secondary | ICD-10-CM | POA: Diagnosis present

## 2022-10-17 DIAGNOSIS — Z79890 Hormone replacement therapy (postmenopausal): Secondary | ICD-10-CM

## 2022-10-17 DIAGNOSIS — T148XXA Other injury of unspecified body region, initial encounter: Secondary | ICD-10-CM | POA: Diagnosis not present

## 2022-10-17 DIAGNOSIS — Z9981 Dependence on supplemental oxygen: Secondary | ICD-10-CM

## 2022-10-17 DIAGNOSIS — Z823 Family history of stroke: Secondary | ICD-10-CM

## 2022-10-17 DIAGNOSIS — L089 Local infection of the skin and subcutaneous tissue, unspecified: Principal | ICD-10-CM

## 2022-10-17 DIAGNOSIS — Z888 Allergy status to other drugs, medicaments and biological substances status: Secondary | ICD-10-CM

## 2022-10-17 DIAGNOSIS — Z66 Do not resuscitate: Secondary | ICD-10-CM | POA: Diagnosis present

## 2022-10-17 DIAGNOSIS — R161 Splenomegaly, not elsewhere classified: Secondary | ICD-10-CM | POA: Diagnosis present

## 2022-10-17 DIAGNOSIS — E785 Hyperlipidemia, unspecified: Secondary | ICD-10-CM | POA: Diagnosis not present

## 2022-10-17 DIAGNOSIS — D649 Anemia, unspecified: Secondary | ICD-10-CM | POA: Diagnosis present

## 2022-10-17 DIAGNOSIS — I480 Paroxysmal atrial fibrillation: Secondary | ICD-10-CM | POA: Diagnosis present

## 2022-10-17 DIAGNOSIS — E1140 Type 2 diabetes mellitus with diabetic neuropathy, unspecified: Secondary | ICD-10-CM | POA: Diagnosis present

## 2022-10-17 DIAGNOSIS — Z950 Presence of cardiac pacemaker: Secondary | ICD-10-CM

## 2022-10-17 DIAGNOSIS — K219 Gastro-esophageal reflux disease without esophagitis: Secondary | ICD-10-CM | POA: Diagnosis present

## 2022-10-17 DIAGNOSIS — I10 Essential (primary) hypertension: Secondary | ICD-10-CM | POA: Diagnosis not present

## 2022-10-17 DIAGNOSIS — Z881 Allergy status to other antibiotic agents status: Secondary | ICD-10-CM

## 2022-10-17 DIAGNOSIS — Z79899 Other long term (current) drug therapy: Secondary | ICD-10-CM

## 2022-10-17 DIAGNOSIS — G8929 Other chronic pain: Secondary | ICD-10-CM | POA: Diagnosis present

## 2022-10-17 DIAGNOSIS — Z7984 Long term (current) use of oral hypoglycemic drugs: Secondary | ICD-10-CM

## 2022-10-17 DIAGNOSIS — Z1152 Encounter for screening for COVID-19: Secondary | ICD-10-CM

## 2022-10-17 DIAGNOSIS — M797 Fibromyalgia: Secondary | ICD-10-CM | POA: Diagnosis present

## 2022-10-17 DIAGNOSIS — R52 Pain, unspecified: Secondary | ICD-10-CM

## 2022-10-17 DIAGNOSIS — E871 Hypo-osmolality and hyponatremia: Secondary | ICD-10-CM | POA: Diagnosis present

## 2022-10-17 DIAGNOSIS — Z8249 Family history of ischemic heart disease and other diseases of the circulatory system: Secondary | ICD-10-CM

## 2022-10-17 DIAGNOSIS — Z87891 Personal history of nicotine dependence: Secondary | ICD-10-CM

## 2022-10-17 DIAGNOSIS — Z7952 Long term (current) use of systemic steroids: Secondary | ICD-10-CM

## 2022-10-17 DIAGNOSIS — Z7901 Long term (current) use of anticoagulants: Secondary | ICD-10-CM

## 2022-10-17 DIAGNOSIS — J439 Emphysema, unspecified: Secondary | ICD-10-CM | POA: Diagnosis present

## 2022-10-17 DIAGNOSIS — Z923 Personal history of irradiation: Secondary | ICD-10-CM

## 2022-10-17 DIAGNOSIS — Z952 Presence of prosthetic heart valve: Secondary | ICD-10-CM

## 2022-10-17 LAB — URINALYSIS, W/ REFLEX TO CULTURE (INFECTION SUSPECTED)
Bacteria, UA: NONE SEEN
Bilirubin Urine: NEGATIVE
Glucose, UA: >=500 mg/dL — AB
Hgb urine dipstick: NEGATIVE
Ketones, ur: NEGATIVE mg/dL
Nitrite: NEGATIVE
Protein, ur: 30 mg/dL — AB
Specific Gravity, Urine: 1.015 (ref 1.005–1.030)
pH: 6 (ref 5.0–8.0)

## 2022-10-17 LAB — COMPREHENSIVE METABOLIC PANEL
ALT: 15 U/L (ref 0–44)
AST: 18 U/L (ref 15–41)
Albumin: 3.3 g/dL — ABNORMAL LOW (ref 3.5–5.0)
Alkaline Phosphatase: 76 U/L (ref 38–126)
Anion gap: 10 (ref 5–15)
BUN: 22 mg/dL (ref 8–23)
CO2: 22 mmol/L (ref 22–32)
Calcium: 9.2 mg/dL (ref 8.9–10.3)
Chloride: 101 mmol/L (ref 98–111)
Creatinine, Ser: 1.55 mg/dL — ABNORMAL HIGH (ref 0.61–1.24)
GFR, Estimated: 47 mL/min — ABNORMAL LOW (ref >60)
Glucose, Bld: 104 mg/dL — ABNORMAL HIGH (ref 70–99)
Potassium: 4.5 mmol/L (ref 3.5–5.1)
Sodium: 133 mmol/L — ABNORMAL LOW (ref 135–145)
Total Bilirubin: 0.5 mg/dL (ref 0.3–1.2)
Total Protein: 7.2 g/dL (ref 6.5–8.1)

## 2022-10-17 LAB — CBC WITH DIFFERENTIAL/PLATELET
Abs Immature Granulocytes: 0.12 10*3/uL — ABNORMAL HIGH (ref 0.00–0.07)
Basophils Absolute: 0.1 10*3/uL (ref 0.0–0.1)
Basophils Relative: 0 %
Eosinophils Absolute: 0.1 10*3/uL (ref 0.0–0.5)
Eosinophils Relative: 1 %
HCT: 33.9 % — ABNORMAL LOW (ref 39.0–52.0)
Hemoglobin: 10.6 g/dL — ABNORMAL LOW (ref 13.0–17.0)
Immature Granulocytes: 1 %
Lymphocytes Relative: 7 %
Lymphs Abs: 0.8 10*3/uL (ref 0.7–4.0)
MCH: 29 pg (ref 26.0–34.0)
MCHC: 31.3 g/dL (ref 30.0–36.0)
MCV: 92.9 fL (ref 80.0–100.0)
Monocytes Absolute: 1.2 10*3/uL — ABNORMAL HIGH (ref 0.1–1.0)
Monocytes Relative: 10 %
Neutro Abs: 9.7 10*3/uL — ABNORMAL HIGH (ref 1.7–7.7)
Neutrophils Relative %: 81 %
Platelets: 193 10*3/uL (ref 150–400)
RBC: 3.65 MIL/uL — ABNORMAL LOW (ref 4.22–5.81)
RDW: 14.2 % (ref 11.5–15.5)
WBC: 11.9 10*3/uL — ABNORMAL HIGH (ref 4.0–10.5)
nRBC: 0 % (ref 0.0–0.2)

## 2022-10-17 LAB — PROTIME-INR
INR: 1.3 — ABNORMAL HIGH (ref 0.8–1.2)
Prothrombin Time: 16.3 seconds — ABNORMAL HIGH (ref 11.4–15.2)

## 2022-10-17 LAB — SARS CORONAVIRUS 2 BY RT PCR: SARS Coronavirus 2 by RT PCR: NEGATIVE

## 2022-10-17 LAB — LACTIC ACID, PLASMA: Lactic Acid, Venous: 1 mmol/L (ref 0.5–1.9)

## 2022-10-17 LAB — APTT: aPTT: 39 seconds — ABNORMAL HIGH (ref 24–36)

## 2022-10-17 LAB — TROPONIN I (HIGH SENSITIVITY)
Troponin I (High Sensitivity): 14 ng/L (ref <18)
Troponin I (High Sensitivity): 14 ng/L (ref <18)

## 2022-10-17 LAB — BRAIN NATRIURETIC PEPTIDE: B Natriuretic Peptide: 214.3 pg/mL — ABNORMAL HIGH (ref 0.0–100.0)

## 2022-10-17 LAB — LIPASE, BLOOD: Lipase: 28 U/L (ref 11–51)

## 2022-10-17 MED ORDER — POLYETHYL GLYC-PROPYL GLYC PF 0.4-0.3 % OP SOLN: 1.0000 [drp] | Freq: Two times a day (BID) | OPHTHALMIC | Status: DC | PRN

## 2022-10-17 MED ORDER — DM-GUAIFENESIN ER 30-600 MG PO TB12
2.0000 | ORAL_TABLET | Freq: Two times a day (BID) | ORAL | Status: DC
  Administered 2022-10-17 – 2022-10-19 (×4): 2 via ORAL
  Filled 2022-10-17 (×5): qty 2

## 2022-10-17 MED ORDER — AMLODIPINE BESYLATE 5 MG PO TABS
5.0000 mg | ORAL_TABLET | Freq: Every morning | ORAL | Status: DC
  Administered 2022-10-18 – 2022-10-19 (×2): 5 mg via ORAL
  Filled 2022-10-17 (×2): qty 1

## 2022-10-17 MED ORDER — TRAZODONE HCL 50 MG PO TABS
50.0000 mg | ORAL_TABLET | Freq: Every day | ORAL | Status: DC
  Administered 2022-10-17 – 2022-10-18 (×2): 50 mg via ORAL
  Filled 2022-10-17 (×2): qty 1

## 2022-10-17 MED ORDER — ACETAMINOPHEN 650 MG RE SUPP: 650.0000 mg | Freq: Four times a day (QID) | RECTAL | Status: DC | PRN

## 2022-10-17 MED ORDER — IOHEXOL 350 MG/ML SOLN
75.0000 mL | Freq: Once | INTRAVENOUS | Status: AC | PRN
  Administered 2022-10-17: 75 mL via INTRAVENOUS

## 2022-10-17 MED ORDER — GABAPENTIN 300 MG PO CAPS
600.0000 mg | ORAL_CAPSULE | Freq: Three times a day (TID) | ORAL | Status: DC
  Administered 2022-10-17 – 2022-10-19 (×6): 600 mg via ORAL
  Filled 2022-10-17 (×6): qty 2

## 2022-10-17 MED ORDER — SUCRALFATE 1 G PO TABS
1.0000 g | ORAL_TABLET | Freq: Three times a day (TID) | ORAL | Status: DC
  Administered 2022-10-18 – 2022-10-19 (×3): 1 g via ORAL
  Filled 2022-10-17 (×3): qty 1

## 2022-10-17 MED ORDER — SODIUM CHLORIDE 0.9 % IV BOLUS
500.0000 mL | Freq: Once | INTRAVENOUS | Status: AC
  Administered 2022-10-17: 500 mL via INTRAVENOUS

## 2022-10-17 MED ORDER — MAGNESIUM HYDROXIDE 400 MG/5ML PO SUSP: 30.0000 mL | Freq: Every day | ORAL | Status: DC | PRN

## 2022-10-17 MED ORDER — SODIUM CHLORIDE 0.9 % IV SOLN
1.0000 g | Freq: Once | INTRAVENOUS | Status: AC
  Administered 2022-10-17: 1 g via INTRAVENOUS
  Filled 2022-10-17: qty 10

## 2022-10-17 MED ORDER — MENTHOL-CAMPHOR 11-11 % EX CREA: TOPICAL_CREAM | CUTANEOUS | Status: DC | PRN

## 2022-10-17 MED ORDER — UMECLIDINIUM BROMIDE 62.5 MCG/ACT IN AEPB
1.0000 | INHALATION_SPRAY | Freq: Every day | RESPIRATORY_TRACT | Status: DC
  Administered 2022-10-18 – 2022-10-19 (×2): 1 via RESPIRATORY_TRACT
  Filled 2022-10-17: qty 7

## 2022-10-17 MED ORDER — ENOXAPARIN SODIUM 40 MG/0.4ML IJ SOSY: 40.0000 mg | PREFILLED_SYRINGE | INTRAMUSCULAR | Status: DC

## 2022-10-17 MED ORDER — ALBUTEROL SULFATE (2.5 MG/3ML) 0.083% IN NEBU: 2.5000 mg | INHALATION_SOLUTION | Freq: Four times a day (QID) | RESPIRATORY_TRACT | Status: DC | PRN

## 2022-10-17 MED ORDER — LEVOTHYROXINE SODIUM 75 MCG PO TABS
175.0000 ug | ORAL_TABLET | Freq: Every day | ORAL | Status: DC
  Administered 2022-10-18 – 2022-10-19 (×2): 175 ug via ORAL
  Filled 2022-10-17: qty 2
  Filled 2022-10-17 (×2): qty 1
  Filled 2022-10-17: qty 2
  Filled 2022-10-17 (×2): qty 1

## 2022-10-17 MED ORDER — VANCOMYCIN HCL 1750 MG/350ML IV SOLN
1750.0000 mg | Freq: Once | INTRAVENOUS | Status: AC
  Administered 2022-10-17: 1750 mg via INTRAVENOUS
  Filled 2022-10-17: qty 350

## 2022-10-17 MED ORDER — BISOPROLOL FUMARATE 5 MG PO TABS
5.0000 mg | ORAL_TABLET | Freq: Every day | ORAL | Status: DC
  Administered 2022-10-18 – 2022-10-19 (×2): 5 mg via ORAL
  Filled 2022-10-17 (×2): qty 1

## 2022-10-17 MED ORDER — OXYCODONE-ACETAMINOPHEN 5-325 MG PO TABS
1.0000 | ORAL_TABLET | Freq: Once | ORAL | Status: AC
  Administered 2022-10-17: 1 via ORAL
  Filled 2022-10-17: qty 1

## 2022-10-17 MED ORDER — MUSCLE RUB 10-15 % EX CREA: TOPICAL_CREAM | CUTANEOUS | Status: DC | PRN

## 2022-10-17 MED ORDER — ONDANSETRON HCL 4 MG PO TABS: 4.0000 mg | ORAL_TABLET | Freq: Three times a day (TID) | ORAL | Status: DC | PRN

## 2022-10-17 MED ORDER — PANTOPRAZOLE SODIUM 40 MG PO TBEC
40.0000 mg | DELAYED_RELEASE_TABLET | Freq: Every day | ORAL | Status: DC
  Administered 2022-10-18 – 2022-10-19 (×2): 40 mg via ORAL
  Filled 2022-10-17 (×2): qty 1

## 2022-10-17 MED ORDER — TORSEMIDE 10 MG PO TABS
15.0000 mg | ORAL_TABLET | ORAL | Status: DC
  Administered 2022-10-18: 15 mg via ORAL
  Filled 2022-10-17: qty 1.5

## 2022-10-17 MED ORDER — MONTELUKAST SODIUM 10 MG PO TABS
10.0000 mg | ORAL_TABLET | Freq: Every day | ORAL | Status: DC
  Administered 2022-10-17 – 2022-10-18 (×2): 10 mg via ORAL
  Filled 2022-10-17 (×2): qty 1

## 2022-10-17 MED ORDER — ONDANSETRON HCL 4 MG/2ML IJ SOLN
4.0000 mg | Freq: Once | INTRAMUSCULAR | Status: AC
  Administered 2022-10-17: 4 mg via INTRAVENOUS
  Filled 2022-10-17: qty 2

## 2022-10-17 MED ORDER — POLYETHYLENE GLYCOL 3350 17 G PO PACK
17.0000 g | PACK | Freq: Every day | ORAL | Status: DC
  Administered 2022-10-17 – 2022-10-18 (×2): 17 g via ORAL
  Filled 2022-10-17 (×4): qty 1

## 2022-10-17 MED ORDER — SODIUM CHLORIDE 0.9 % IV SOLN: INTRAVENOUS | Status: DC

## 2022-10-17 MED ORDER — ADULT MULTIVITAMIN W/MINERALS CH
1.0000 | ORAL_TABLET | Freq: Every day | ORAL | Status: DC
  Administered 2022-10-18 – 2022-10-19 (×2): 1 via ORAL
  Filled 2022-10-17 (×2): qty 1

## 2022-10-17 MED ORDER — PREDNISONE 5 MG PO TABS
2.5000 mg | ORAL_TABLET | Freq: Every day | ORAL | Status: DC
  Administered 2022-10-18 – 2022-10-19 (×2): 2.5 mg via ORAL
  Filled 2022-10-17 (×2): qty 0.5

## 2022-10-17 MED ORDER — SPIRONOLACTONE 12.5 MG HALF TABLET
25.0000 mg | ORAL_TABLET | Freq: Every day | ORAL | Status: DC
  Administered 2022-10-18 – 2022-10-19 (×2): 25 mg via ORAL
  Filled 2022-10-17 (×2): qty 2

## 2022-10-17 MED ORDER — ALBUTEROL SULFATE HFA 108 (90 BASE) MCG/ACT IN AERS: 2.0000 | INHALATION_SPRAY | Freq: Four times a day (QID) | RESPIRATORY_TRACT | Status: DC | PRN

## 2022-10-17 MED ORDER — FENTANYL CITRATE PF 50 MCG/ML IJ SOSY
50.0000 ug | PREFILLED_SYRINGE | Freq: Once | INTRAMUSCULAR | Status: AC
  Administered 2022-10-17: 50 ug via INTRAVENOUS
  Filled 2022-10-17: qty 1

## 2022-10-17 MED ORDER — SENNOSIDES-DOCUSATE SODIUM 8.6-50 MG PO TABS
1.0000 | ORAL_TABLET | Freq: Two times a day (BID) | ORAL | Status: DC
  Administered 2022-10-17 – 2022-10-19 (×4): 1 via ORAL
  Filled 2022-10-17 (×4): qty 1

## 2022-10-17 MED ORDER — BUDESON-GLYCOPYRROL-FORMOTEROL 160-9-4.8 MCG/ACT IN AERO: 2.0000 | INHALATION_SPRAY | Freq: Two times a day (BID) | RESPIRATORY_TRACT | Status: DC

## 2022-10-17 MED ORDER — EMPAGLIFLOZIN 10 MG PO TABS
10.0000 mg | ORAL_TABLET | Freq: Every day | ORAL | Status: DC
  Administered 2022-10-18 – 2022-10-19 (×2): 10 mg via ORAL
  Filled 2022-10-17 (×2): qty 1

## 2022-10-17 MED ORDER — POLYVINYL ALCOHOL 1.4 % OP SOLN: 1.0000 [drp] | Freq: Two times a day (BID) | OPHTHALMIC | Status: DC | PRN

## 2022-10-17 MED ORDER — ONDANSETRON HCL 4 MG/2ML IJ SOLN: 4.0000 mg | Freq: Four times a day (QID) | INTRAMUSCULAR | Status: DC | PRN

## 2022-10-17 MED ORDER — VANCOMYCIN HCL 1250 MG/250ML IV SOLN
1250.0000 mg | INTRAVENOUS | Status: DC
  Administered 2022-10-18: 1250 mg via INTRAVENOUS
  Filled 2022-10-17: qty 250

## 2022-10-17 MED ORDER — MOMETASONE FURO-FORMOTEROL FUM 200-5 MCG/ACT IN AERO
2.0000 | INHALATION_SPRAY | Freq: Two times a day (BID) | RESPIRATORY_TRACT | Status: DC
  Administered 2022-10-18 – 2022-10-19 (×3): 2 via RESPIRATORY_TRACT
  Filled 2022-10-17: qty 8.8

## 2022-10-17 MED ORDER — POTASSIUM CHLORIDE CRYS ER 20 MEQ PO TBCR
20.0000 meq | EXTENDED_RELEASE_TABLET | ORAL | Status: DC
  Administered 2022-10-18: 20 meq via ORAL
  Filled 2022-10-17 (×2): qty 1

## 2022-10-17 MED ORDER — CAL-MAG-ZINC-D PO TABS: 1.0000 | ORAL_TABLET | Freq: Every day | ORAL | Status: DC

## 2022-10-17 MED ORDER — MUCINEX DM MAXIMUM STRENGTH 60-1200 MG PO TB12: 1.0000 | ORAL_TABLET | Freq: Every day | ORAL | Status: DC

## 2022-10-17 MED ORDER — TRAZODONE HCL 50 MG PO TABS: 25.0000 mg | ORAL_TABLET | Freq: Every evening | ORAL | Status: DC | PRN

## 2022-10-17 MED ORDER — MORPHINE SULFATE (PF) 4 MG/ML IV SOLN
4.0000 mg | Freq: Once | INTRAVENOUS | Status: AC
  Administered 2022-10-17: 4 mg via INTRAVENOUS
  Filled 2022-10-17: qty 1

## 2022-10-17 MED ORDER — ONDANSETRON HCL 4 MG PO TABS: 4.0000 mg | ORAL_TABLET | Freq: Four times a day (QID) | ORAL | Status: DC | PRN

## 2022-10-17 MED ORDER — APIXABAN 5 MG PO TABS
5.0000 mg | ORAL_TABLET | Freq: Two times a day (BID) | ORAL | Status: DC
  Administered 2022-10-17 – 2022-10-19 (×4): 5 mg via ORAL
  Filled 2022-10-17 (×4): qty 1

## 2022-10-17 MED ORDER — ACETAMINOPHEN 325 MG PO TABS: 650.0000 mg | ORAL_TABLET | Freq: Four times a day (QID) | ORAL | Status: DC | PRN

## 2022-10-17 MED ORDER — VITAMIN D 25 MCG (1000 UNIT) PO TABS
8000.0000 [IU] | ORAL_TABLET | Freq: Every day | ORAL | Status: DC
  Administered 2022-10-18 – 2022-10-19 (×2): 8000 [IU] via ORAL
  Filled 2022-10-17 (×2): qty 8
  Filled 2022-10-17: qty 4

## 2022-10-17 MED ORDER — SODIUM CHLORIDE 0.9 % IV SOLN
2.0000 g | INTRAVENOUS | Status: DC
  Administered 2022-10-18: 2 g via INTRAVENOUS
  Filled 2022-10-17: qty 20

## 2022-10-17 MED ORDER — ROSUVASTATIN CALCIUM 5 MG PO TABS
10.0000 mg | ORAL_TABLET | Freq: Every day | ORAL | Status: DC
  Administered 2022-10-17 – 2022-10-18 (×2): 10 mg via ORAL
  Filled 2022-10-17 (×2): qty 2

## 2022-10-17 MED ORDER — ROPINIROLE HCL 1 MG PO TABS
1.0000 mg | ORAL_TABLET | Freq: Two times a day (BID) | ORAL | Status: DC
  Administered 2022-10-17 – 2022-10-19 (×4): 1 mg via ORAL
  Filled 2022-10-17 (×5): qty 1

## 2022-10-17 MED ORDER — OXYCODONE-ACETAMINOPHEN 5-325 MG PO TABS
1.0000 | ORAL_TABLET | ORAL | Status: DC | PRN
  Administered 2022-10-17 – 2022-10-19 (×4): 1 via ORAL
  Filled 2022-10-17 (×4): qty 1

## 2022-10-17 NOTE — ED Triage Notes (Signed)
Pt arrived via POV for poor wound healing and new onset of SOB. A&Ox4, NAD noted at this time.

## 2022-10-17 NOTE — Telephone Encounter (Signed)
Patient is a Dr. Lajoyce Corners patient

## 2022-10-17 NOTE — ED Notes (Signed)
Pt updated on plan of care

## 2022-10-17 NOTE — Progress Notes (Signed)
Pharmacy Antibiotic Note  [name] is a 72 y.o. male admitted on 10/17/2022 with sepsis.  Pharmacy has been consulted for vancomycin dosing. Noted allergy of vancomycin from 2017. Has tolerated vancomycin after allergy.   Plan: Vancomycin 1750mg  x1, followed by 1250mg  q24H eAUC: 510.  I have doubled infusion time to 240 minutes to mediate infusion reaction.  CFTX x 1 per MD.  Follow culture data for de-escalation.  Monitor renal function for dose adjustments as indicated.    Height: 5\' 11"  (180.3 cm) Weight: 81.2 kg (179 lb 0.2 oz) IBW/kg (Calculated) : 75.3  Temp (24hrs), Avg:98 F (36.7 C), Min:98 F (36.7 C), Max:98 F (36.7 C)  Recent Labs  Lab 10/17/22 0740 10/17/22 0800  WBC  --  11.9*  CREATININE  --  1.55*  LATICACIDVEN 1.0  --     Estimated Creatinine Clearance: 45.2 mL/min (A) (by C-G formula based on SCr of 1.55 mg/dL (H)).    Allergies  Allergen Reactions   Meloxicam Rash and Other (See Comments)    Red Man Syndrome, also   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 syndrome 09/02/15, resolved with diphenhydramine and slowing of rate    Thank you for allowing pharmacy to be a part of this patient's care.  Estill Batten, PharmD, BCCCP  10/17/2022 7:27 PM

## 2022-10-17 NOTE — ED Notes (Signed)
..ED TO INPATIENT HANDOFF REPORT  ED Nurse Name and Phone #: [phone]  S Name/Age/Gender [name] 72 y.o. male Room/Bed: 028C/028C  Code Status   Code Status: Prior  Home/SNF/Other Home Patient oriented to: self, place, time, and situation Is this baseline? Yes   Triage Complete: Triage complete  Chief Complaint Cellulitis of right lower extremity [L03.115]  Triage Note Pt arrived via POV for poor wound healing and new onset of SOB. A&Ox4, NAD noted at this time.    Allergies Allergies  Allergen Reactions   Meloxicam Rash and Other (See Comments)    Red Man Syndrome, also   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 syndrome 09/02/15, resolved with diphenhydramine and slowing of rate    Level of Care/Admitting Diagnosis ED Disposition     ED Disposition  Admit   Condition  --   Comment  Hospital Area: [facility] [[id number]]  Level of Care: Med-Surg [16]  May place patient in observation at [facility] or [facility] if equivalent level of care is available:: No  Covid Evaluation: Asymptomatic - no recent exposure (last 10 days) testing not required  Diagnosis: Cellulitis of right lower extremity [454098]  Admitting Physician: Hannah Beat [[id number]]  Attending Physician: Hannah Beat [[id number]]          B Medical/Surgery History Past Medical History:  Diagnosis Date   Aortic stenosis, mild    Arthritis of hand    "just a little bit in both hands" (03/31/2012)   Asthma    "little bit" (03/31/2012)   Atrial fibrillation (HCC)    dx '04; DCCV '04, placed on flecainide, failed DCCV 04/2010, flecainide stopped; s/p successful A.fib ablation 01/31/12   CHF (congestive heart failure) (HCC)    Controlled type 2 diabetes mellitus without complication, without long-term current use of insulin (HCC) 12/13/2019   COPD (chronic obstructive pulmonary disease) (HCC)    DJD (degenerative joint disease)    Fatty liver    Fibromyalgia    GERD  (gastroesophageal reflux disease)    Hyperlipidemia    Hypertension    Hypothyroidism    S/P radiation   Left atrial enlargement    LA size 45mm by echo 11/21/11   Mitral regurgitation    trivial   Obstructive sleep apnea    mild by sleep study 2013; pt stated he does not have a machine because "it wasn't bad enough for him to have one"   Pacemaker 03/31/2012   Pneumonia    last time 2023   Restless leg syndrome    Second degree AV block    Splenomegaly    Past Surgical History:  Procedure Laterality Date   AMPUTATION Right 08/23/2022   Procedure: RIGHT FOOT FIFTH RAY AMPUTATION;  Surgeon: [name], MD;  Location: [facility] OR;  Service: Orthopedics;  Laterality: Right;   AMPUTATION Right 09/04/2022   Procedure: RIGHT BELOW KNEE AMPUTATION;  Surgeon: [name], MD;  Location: [facility] OR;  Service: Orthopedics;  Laterality: Right;   AORTIC VALVE REPLACEMENT N/A 08/22/2015   Procedure: AORTIC VALVE REPLACEMENT (AVR);  Surgeon: Kerin Perna, MD;  Location: [facility] OR;  Service: Open Heart Surgery;  Laterality: N/A;   ATRIAL FIBRILLATION ABLATION  01/30/2012   PVI by Dr. Johney Frame   ATRIAL FIBRILLATION ABLATION N/A 01/31/2012   Procedure: ATRIAL FIBRILLATION ABLATION;  Surgeon: [name], MD;  Location: [facility] CATH LAB;  Service: Cardiovascular;  Laterality: N/A;   ATRIAL FIBRILLATION ABLATION N/A 04/19/2020  Procedure: ATRIAL FIBRILLATION ABLATION;  Surgeon: [name], MD;  Location: [facility] INVASIVE CV LAB;  Service: Cardiovascular;  Laterality: N/A;   BACK SURGERY     X 3   BIOPSY  01/01/2022   Procedure: BIOPSY;  Surgeon: Kathi Der, MD;  Location: [facility] ENDOSCOPY;  Service: Gastroenterology;;   CARDIAC CATHETERIZATION N/A 08/09/2015   Procedure: Right/Left Heart Cath and Coronary Angiography;  Surgeon: Peter M Swaziland, MD;  Location: [facility] INVASIVE CV LAB;  Service: Cardiovascular;  Laterality: N/A;   CARDIAC CATHETERIZATION N/A 02/05/2016   Procedure: Right/Left Heart Cath and Coronary  Angiography;  Surgeon: Corky Crafts, MD;  Location: [facility] INVASIVE CV LAB;  Service: Cardiovascular;  Laterality: N/A;   CARDIAC CATHETERIZATION N/A 04/17/2016   Procedure: Right Heart Cath;  Surgeon: Dolores Patty, MD;  Location: [facility] INVASIVE CV LAB;  Service: Cardiovascular;  Laterality: N/A;   CARDIOVASCULAR STRESS TEST  03/12/2002   EF 48%, NO EVIDENCE OF ISCHEMIA   CARDIOVERSION  01/2012; 03/31/2012   CARDIOVERSION N/A 02/25/2012   Procedure: CARDIOVERSION;  Surgeon: [name], MD;  Location: [facility] CATH LAB;  Service: Cardiovascular;  Laterality: N/A;   CARDIOVERSION N/A 03/31/2012   Procedure: CARDIOVERSION;  Surgeon: [name], MD;  Location: [facility] CATH LAB;  Service: Cardiovascular;  Laterality: N/A;   CARDIOVERSION N/A 11/23/2015   Procedure: CARDIOVERSION;  Surgeon: Laurey Morale, MD;  Location: [facility] ENDOSCOPY;  Service: Cardiovascular;  Laterality: N/A;   CARDIOVERSION N/A 04/08/2016   Procedure: CARDIOVERSION;  Surgeon: Dolores Patty, MD;  Location: [facility] ENDOSCOPY;  Service: Cardiovascular;  Laterality: N/A;   CARDIOVERSION N/A 09/14/2018   Procedure: CARDIOVERSION;  Surgeon: Wendall Stade, MD;  Location: [facility] ENDOSCOPY;  Service: Cardiovascular;  Laterality: N/A;   CARDIOVERSION N/A 10/19/2019   Procedure: CARDIOVERSION;  Surgeon: Dolores Patty, MD;  Location: [facility] ENDOSCOPY;  Service: Cardiovascular;  Laterality: N/A;   CARDIOVERSION N/A 02/25/2020   Procedure: CARDIOVERSION;  Surgeon: Pricilla Riffle, MD;  Location: [facility] ENDOSCOPY;  Service: Cardiovascular;  Laterality: N/A;   CARDIOVERSION N/A 05/25/2020   Procedure: CARDIOVERSION;  Surgeon: Lewayne Bunting, MD;  Location: [facility] ENDOSCOPY;  Service: Cardiovascular;  Laterality: N/A;   CARDIOVERSION N/A 07/10/2020   Procedure: CARDIOVERSION;  Surgeon: Jake Bathe, MD;  Location: [facility] ENDOSCOPY;  Service: Cardiovascular;  Laterality: N/A;   CLIPPING OF ATRIAL APPENDAGE N/A 08/22/2015   Procedure: CLIPPING OF ATRIAL  APPENDAGE;  Surgeon: Kerin Perna, MD;  Location: [facility] OR;  Service: Open Heart Surgery;  Laterality: N/A;   COLONOSCOPY WITH PROPOFOL N/A 01/01/2022   Procedure: COLONOSCOPY WITH PROPOFOL;  Surgeon: Kathi Der, MD;  Location: [facility] ENDOSCOPY;  Service: Gastroenterology;  Laterality: N/A;   DOPPLER ECHOCARDIOGRAPHY  03/11/2002   EF 70-75%   FINGER SURGERY Left    Middle finger   FINGER TENDON REPAIR  1980's   "right little finger" (03/31/2012)   FLEXIBLE SIGMOIDOSCOPY N/A 12/30/2021   Procedure: FLEXIBLE SIGMOIDOSCOPY;  Surgeon: Charlott Rakes, MD;  Location: [facility] ENDOSCOPY;  Service: Gastroenterology;  Laterality: N/A;   INSERT / REPLACE / REMOVE PACEMAKER     St Jude   IR RADIOLOGIST EVAL & MGMT  05/06/2019   IR RADIOLOGIST EVAL & MGMT  10/06/2019   IR RADIOLOGIST EVAL & MGMT  10/31/2020   IR RADIOLOGIST EVAL & MGMT  01/18/2021   IR RADIOLOGIST EVAL & MGMT  03/08/2021   IR RADIOLOGIST EVAL & MGMT  06/13/2021   KIDNEY SURGERY Left    LEFT AND RIGHT HEART CATHETERIZATION WITH CORONARY ANGIOGRAM  N/A 10/27/2012   Procedure: LEFT AND RIGHT HEART CATHETERIZATION WITH CORONARY ANGIOGRAM;  Surgeon: Pamella Pert, MD;  Location: [facility] CATH LAB;  Service: Cardiovascular;  Laterality: N/A;   LUMBAR DISC SURGERY  1980's X2;  2000's   MAZE N/A 08/22/2015   Procedure: MAZE;  Surgeon: Kerin Perna, MD;  Location: [facility] OR;  Service: Open Heart Surgery;  Laterality: N/A;   PACEMAKER INSERTION  03/31/2012   STJ Accent DR pacemaker implanted by Dr Johney Frame   PERMANENT PACEMAKER INSERTION N/A 03/31/2012   Procedure: PERMANENT PACEMAKER INSERTION;  Surgeon: [name], MD;  Location: [facility] CATH LAB;  Service: Cardiovascular;  Laterality: N/A;   PPM GENERATOR CHANGEOUT N/A 08/08/2020   Procedure: PPM GENERATOR CHANGEOUT;  Surgeon: [name], MD;  Location: [facility] INVASIVE CV LAB;  Service: Cardiovascular;  Laterality: N/A;   RADIOLOGY WITH ANESTHESIA Left 02/14/2021   Procedure: RADIOLOGY WITH ANESTHESIA  LEFT RENAL CRYOABLATION;  Surgeon: Irish Lack, MD;  Location: [facility] ORS;  Service: Radiology;  Laterality: Left;   STUMP REVISION Right 10/09/2022   Procedure: REVISION RIGHT BELOW KNEE AMPUTATION;  Surgeon: [name], MD;  Location: [facility] OR;  Service: Orthopedics;  Laterality: Right;   TEE WITHOUT CARDIOVERSION  01/30/2012   Procedure: TRANSESOPHAGEAL ECHOCARDIOGRAM (TEE);  Surgeon: Laurey Morale, MD;  Location: [facility] ENDOSCOPY;  Service: Cardiovascular;  Laterality: N/A;  ablation next day   TEE WITHOUT CARDIOVERSION N/A 08/22/2015   Procedure: TRANSESOPHAGEAL ECHOCARDIOGRAM (TEE);  Surgeon: Kerin Perna, MD;  Location: [facility] OR;  Service: Open Heart Surgery;  Laterality: N/A;   TEE WITHOUT CARDIOVERSION N/A 10/19/2019   Procedure: TRANSESOPHAGEAL ECHOCARDIOGRAM (TEE);  Surgeon: Dolores Patty, MD;  Location: [facility] ENDOSCOPY;  Service: Cardiovascular;  Laterality: N/A;   US ECHOCARDIOGRAPHY  08/07/2009   EF 55-60%     A IV Location/Drains/Wounds Patient Lines/Drains/Airways Status     Active Line/Drains/Airways     Name Placement date Placement time Site Days   Peripheral IV 10/17/22 18 G Anterior;Right;Distal Forearm 10/17/22  0757  Forearm  less than 1   Peripheral IV 10/17/22 20 G Left Antecubital 10/17/22  0801  Antecubital  less than 1   Negative Pressure Wound Therapy Leg Left 10/09/22  1154  --  8            Intake/Output Last 24 hours  Intake/Output Summary (Last 24 hours) at 10/17/2022 1947 Last data filed at 10/17/2022 1610 Gross per 24 hour  Intake 500 ml  Output --  Net 500 ml    Labs/Imaging Results for orders placed or performed during the hospital encounter of 10/17/22 (from the past 48 hour(s))  Lactic acid, plasma     Status: None   Collection Time: 10/17/22  7:40 AM  Result Value Ref Range   Lactic Acid, Venous 1.0 0.5 - 1.9 mmol/L    Comment: Performed at [facility] Lab, [address], [place], [place] [address]  Comprehensive metabolic  panel     Status: Abnormal   Collection Time: 10/17/22  8:00 AM  Result Value Ref Range   Sodium 133 (L) 135 - 145 mmol/L   Potassium 4.5 3.5 - 5.1 mmol/L   Chloride 101 98 - 111 mmol/L   CO2 22 22 - 32 mmol/L   Glucose, Bld 104 (H) 70 - 99 mg/dL    Comment: Glucose reference range applies only to samples taken after fasting for at least 8 hours.   BUN 22 8 - 23 mg/dL   Creatinine, Ser 4.09 (  H) 0.61 - 1.24 mg/dL   Calcium 9.2 8.9 - 16.1 mg/dL   Total Protein 7.2 6.5 - 8.1 g/dL   Albumin 3.3 (L) 3.5 - 5.0 g/dL   AST 18 15 - 41 U/L   ALT 15 0 - 44 U/L   Alkaline Phosphatase 76 38 - 126 U/L   Total Bilirubin 0.5 0.3 - 1.2 mg/dL   GFR, Estimated 47 (L) >60 mL/min    Comment: (NOTE) Calculated using the CKD-EPI Creatinine Equation (2021)    Anion gap 10 5 - 15    Comment: Performed at [facility] Lab, [address], [place], [place] [address]  CBC with Differential     Status: Abnormal   Collection Time: 10/17/22  8:00 AM  Result Value Ref Range   WBC 11.9 (H) 4.0 - 10.5 K/uL   RBC 3.65 (L) 4.22 - 5.81 MIL/uL   Hemoglobin 10.6 (L) 13.0 - 17.0 g/dL   HCT 54.0 (L) 98.1 - 19.1 %   MCV 92.9 80.0 - 100.0 fL   MCH 29.0 26.0 - 34.0 pg   MCHC 31.3 30.0 - 36.0 g/dL   RDW 47.8 29.5 - 62.1 %   Platelets 193 150 - 400 K/uL   nRBC 0.0 0.0 - 0.2 %   Neutrophils Relative % 81 %   Neutro Abs 9.7 (H) 1.7 - 7.7 K/uL   Lymphocytes Relative 7 %   Lymphs Abs 0.8 0.7 - 4.0 K/uL   Monocytes Relative 10 %   Monocytes Absolute 1.2 (H) 0.1 - 1.0 K/uL   Eosinophils Relative 1 %   Eosinophils Absolute 0.1 0.0 - 0.5 K/uL   Basophils Relative 0 %   Basophils Absolute 0.1 0.0 - 0.1 K/uL   Immature Granulocytes 1 %   Abs Immature Granulocytes 0.12 (H) 0.00 - 0.07 K/uL    Comment: Performed at [facility] Lab, [address] [place], [place] [address]  Protime-INR     Status: Abnormal   Collection Time: 10/17/22  8:00 AM  Result Value Ref Range   Prothrombin Time 16.3 (H) 11.4 - 15.2 seconds    INR 1.3 (H) 0.8 - 1.2    Comment: (NOTE) INR goal varies based on device and disease states. Performed at [facility] Lab, [address], [place], [place] [address]   APTT     Status: Abnormal   Collection Time: 10/17/22  8:00 AM  Result Value Ref Range   aPTT 39 (H) 24 - 36 seconds    Comment:        IF BASELINE aPTT IS ELEVATED, SUGGEST PATIENT RISK ASSESSMENT BE USED TO DETERMINE APPROPRIATE ANTICOAGULANT THERAPY. Performed at [facility] Lab, [address], [place], [place] [address]   Lipase, blood     Status: None   Collection Time: 10/17/22  8:00 AM  Result Value Ref Range   Lipase 28 11 - 51 U/L    Comment: Performed at [facility] Lab, [address] [place], [place] [address]  Brain natriuretic peptide     Status: Abnormal   Collection Time: 10/17/22  8:00 AM  Result Value Ref Range   B Natriuretic Peptide 214.3 (H) 0.0 - 100.0 pg/mL    Comment: Performed at [facility] Lab, [address] [place], [place] [address]  Troponin I (High Sensitivity)     Status: None   Collection Time: 10/17/22  8:00 AM  Result Value Ref Range   Troponin I (High Sensitivity) 14 <18 ng/L  Comment: (NOTE) Elevated high sensitivity troponin I (hsTnI) values and significant  changes across serial measurements may suggest ACS but many other  chronic and acute conditions are known to elevate hsTnI results.  Refer to the "Links" section for chest pain algorithms and additional  guidance. Performed at [facility] Lab, [address] [place], [place] [address]   SARS Coronavirus 2 by RT PCR (hospital order, performed in [facility] hospital lab) *cepheid single result test* Anterior Nasal Swab     Status: None   Collection Time: 10/17/22  9:01 AM   Specimen: Anterior Nasal Swab  Result Value Ref Range   SARS Coronavirus 2 by RT PCR NEGATIVE NEGATIVE    Comment: Performed at [facility], [address], [place], [place] [address]  Troponin I (High Sensitivity)      Status: None   Collection Time: 10/17/22  9:46 AM  Result Value Ref Range   Troponin I (High Sensitivity) 14 <18 ng/L    Comment: (NOTE) Elevated high sensitivity troponin I (hsTnI) values and significant  changes across serial measurements may suggest ACS but many other  chronic and acute conditions are known to elevate hsTnI results.  Refer to the "Links" section for chest pain algorithms and additional  guidance. Performed at [facility] Lab, [address], [place], [place] [address]   Urinalysis, w/ Reflex to Culture (Infection Suspected) -Urine, Clean Catch     Status: Abnormal   Collection Time: 10/17/22 10:16 AM  Result Value Ref Range   Specimen Source URINE, CLEAN CATCH    Color, Urine YELLOW YELLOW   APPearance CLEAR CLEAR   Specific Gravity, Urine 1.015 1.005 - 1.030   pH 6.0 5.0 - 8.0   Glucose, UA >=500 (A) NEGATIVE mg/dL   Hgb urine dipstick NEGATIVE NEGATIVE   Bilirubin Urine NEGATIVE NEGATIVE   Ketones, ur NEGATIVE NEGATIVE mg/dL   Protein, ur 30 (A) NEGATIVE mg/dL   Nitrite NEGATIVE NEGATIVE   Leukocytes,Ua LARGE (A) NEGATIVE   RBC / HPF 0-5 0 - 5 RBC/hpf   WBC, UA 0-5 0 - 5 WBC/hpf    Comment:        Reflex urine culture not performed if WBC <=10, OR if Squamous epithelial cells >5. If Squamous epithelial cells >5 suggest recollection.    Bacteria, UA NONE SEEN NONE SEEN   Squamous Epithelial / HPF 0-5 0 - 5 /HPF    Comment: Performed at [facility] Lab, [address], [place], [place] [address]   *Note: Due to a large number of results and/or encounters for the requested time period, some results have not been displayed. A complete set of results can be found in Results Review.   CT ABDOMEN PELVIS W CONTRAST  Result Date: 10/17/2022 CLINICAL DATA:  Sepsis.  Pain.  Recent renal ablation EXAM: CT ABDOMEN AND PELVIS WITH CONTRAST TECHNIQUE: Multidetector CT imaging of the abdomen and pelvis was performed using the standard protocol following  bolus administration of intravenous contrast. RADIATION DOSE REDUCTION: This exam was performed according to the departmental dose-optimization program which includes automated exposure control, adjustment of the mA and/or kV according to patient size and/or use of iterative reconstruction technique. CONTRAST:  75mL OMNIPAQUE IOHEXOL 350 MG/ML SOLN COMPARISON:  Noncontrast CT 06/24/2018 FINDINGS: Lower chest: Please see separate CT angiogram of the chest from same day. Hepatobiliary: No space-occupying liver lesion. Patent portal vein. Small stone dependently in the nondilated gallbladder. Pancreas: Moderate atrophy of the pancreas. Spleen: Spleen  is enlarged with AP length of 16.5 cm, similar to previous. Adrenals/Urinary Tract: The adrenal glands are preserved. There are areas of severe atrophy along the right kidney. No separate enhancing mass or collecting system dilatation. The right ureter has a normal course and caliber down to the bladder. Bladder is underdistended but has a preserved contour. Left kidney has a focal low-attenuation defect posteriorly in the midportion consistent with previous intervention. The amount of adjacent stranding is similar. Separate tiny Bosniak 2 adjacent cyst and separate lower pole cyst. No specific imaging follow-up of the cystic areas. No collecting system dilatation. The left ureter has a normal course and caliber down to the bladder. Stomach/Bowel: Stomach is collapsed. No oral contrast. Small bowel has a overall normal course and caliber. Normal appendix in the right lower quadrant. Moderate diffuse colonic stool. Redundant course of the left side of the colon and sigmoid colon. Vascular/Lymphatic: Aortic atherosclerosis. No enlarged abdominal or pelvic lymph nodes. Reproductive: Prostate is unremarkable. Other: Small fat containing inguinal hernias, left-greater-than-right. Small fat containing umbilical hernia. No free air or free fluid. Musculoskeletal: Scattered  degenerative changes of the spine and pelvis. IMPRESSION: Moderate colonic stool. No obstruction, free air or free fluid. Normal appendix. Splenomegaly. Severe atrophy of the right kidney. Posttreatment defect on the left, similar to previous. Please see separate dictation of CT angiogram of the chest Electronically Signed   By: Karen Kays M.D.   On: 10/17/2022 11:17   CT Head Wo Contrast  Result Date: 10/17/2022 CLINICAL DATA:  Headache EXAM: CT HEAD WITHOUT CONTRAST TECHNIQUE: Contiguous axial images were obtained from the base of the skull through the vertex without intravenous contrast. RADIATION DOSE REDUCTION: This exam was performed according to the departmental dose-optimization program which includes automated exposure control, adjustment of the mA and/or kV according to patient size and/or use of iterative reconstruction technique. COMPARISON:  05/13/2022 FINDINGS: Brain: No evidence of acute infarction, hemorrhage, mass, mass effect, or midline shift. No hydrocephalus or extra-axial fluid collection. Vascular: No hyperdense vessel. Skull: Negative for fracture or focal lesion. Sinuses/Orbits: No acute finding. Redemonstrated retention cyst or polyp in the right maxillary sinus, which measures up to 1.9 cm, previously 1.4 cm on 06/09/2021 and 1.1 cm on 10/17/2019. Other: The mastoid air cells are well aerated. IMPRESSION: 1. No acute intracranial process. 2. Redemonstrated retention cyst or polyp in the right maxillary sinus, which measures up to 1.9 cm, previously 1.1 cm on 10/17/2019. This is most likely benign, but consider non emergent ENT referral given interval growth. Electronically Signed   By: Wiliam Ke M.D.   On: 10/17/2022 11:16   CT Angio Chest PE W and/or Wo Contrast  Result Date: 10/17/2022 CLINICAL DATA:  Chest tightness. Postop pain recent renal cell carcinoma ablation. EXAM: CT ANGIOGRAPHY CHEST WITH CONTRAST TECHNIQUE: Multidetector CT imaging of the chest was performed  using the standard protocol during bolus administration of intravenous contrast. Multiplanar CT image reconstructions and MIPs were obtained to evaluate the vascular anatomy. RADIATION DOSE REDUCTION: This exam was performed according to the departmental dose-optimization program which includes automated exposure control, adjustment of the mA and/or kV according to patient size and/or use of iterative reconstruction technique. CONTRAST:  75mL OMNIPAQUE IOHEXOL 350 MG/ML SOLN COMPARISON:  Chest x-ray earlier 10/17/2022. CT angiogram chest 06/20/2022 FINDINGS: Cardiovascular: There is significant breathing motion identified throughout the examination. This limits evaluation, nondiagnostic for small and peripheral emboli. No segmental or larger embolus identified. Heart is borderline in size. No significant pericardial effusion. There  is a battery pack along the left upper chest with leads extending along the right side of the heart. Status post median sternotomy with prosthetic aortic valve. The thoracic aorta has scattered vascular calcifications. Contrast is predominantly in the pulmonary arterial tree and very little along the aorta. Mediastinum/Nodes: Small thyroid gland. No specific abnormal lymph node enlargement identified in the axillary region or hilum. There are some small mediastinal nodes seen diffusely, nonpathologic by size criteria. Slightly more numerous than usually seen but unchanged from the previous examination. Normal caliber thoracic esophagus. Lungs/Pleura: Extensive breathing motion. There are some centrilobular emphysematous changes identified. No consolidation, pneumothorax or effusion. Noncalcified right upper lobe nodule identified on series 5, image 46 measuring 4 mm. Unchanged from the previous exam. This is stable going back to a study of June 2021 demonstrating long-term stability. No additional imaging follow-up. Calcified nodule left lower lobe on series 5, image 89 is also seen  consistent with old granulomatous disease and also stable. Upper Abdomen: Adrenal glands are preserved in the upper abdomen. Please see separate dictation of abdomen and pelvis CT from same day Musculoskeletal: Scattered degenerative changes along the spine. Old healed right-sided rib fracture. Review of the MIP images confirms the above findings. IMPRESSION: Extensive breathing motion.  No large or central embolus. Postop chest.  Pacemaker.  Prosthetic aortic valve Aortic Atherosclerosis (ICD10-I70.0) and Emphysema (ICD10-J43.9). Electronically Signed   By: Karen Kays M.D.   On: 10/17/2022 11:11   DG Tibia/Fibula Right  Result Date: 10/17/2022 CLINICAL DATA:  Bleeding from new amputation. EXAM: RIGHT TIBIA AND FIBULA - 2 VIEW COMPARISON:  Right tibia/fibula radiographs 05/29/2020 FINDINGS: The patient is status post below-knee amputation. There is no cortical irregularity or destruction along the amputation margins. There is no acute fracture. Knee alignment is normal. The joint spaces are preserved. The soft tissues are unremarkable. There is no suprapatellar effusion. There is no soft tissue gas or radiopaque foreign body. IMPRESSION: 1. Status post below-knee amputation. No cortical irregularity or destruction along the amputation margins. 2. No acute finding. Electronically Signed   By: Lesia Hausen M.D.   On: 10/17/2022 08:21   DG Chest Port 1 View  Result Date: 10/17/2022 CLINICAL DATA:  Chest tightness, postoperative pain. EXAM: PORTABLE CHEST 1 VIEW COMPARISON:  06/20/2022. FINDINGS: Left chest dual-chamber pacemaker with leads projecting over the right atrium and ventricle. Low lung volumes accentuate the pulmonary vasculature and cardiomediastinal silhouette. No consolidation or pulmonary edema. Stable postoperative changes of median sternotomy, aortic valve replacement and left atrial appendage clipping. No pleural effusion or pneumothorax. Visualized bones and upper abdomen are IMPRESSION: Low  lung volumes without evidence of acute cardiopulmonary disease. Electronically Signed   By: Orvan Falconer M.D.   On: 10/17/2022 08:19    Pending Labs Unresulted Labs (From admission, onward)     Start     Ordered   10/17/22 0740  Blood Culture (routine x 2)  (Undifferentiated presentation (screening labs and basic nursing orders))  BLOOD CULTURE X 2,   STAT      10/17/22 0740            Vitals/Pain Today's Vitals   10/17/22 1530 10/17/22 1630 10/17/22 1730 10/17/22 1900  BP: 123/64 124/63 121/67 (!) 114/55  Pulse: 63 65 64 63  Resp: (!) 21 16 19 16   Temp:    98 F (36.7 C)  TempSrc:      SpO2: 99% 96% 100% 96%  Weight:      Height:  PainSc:        Isolation Precautions No active isolations  Medications Medications  cefTRIAXone (ROCEPHIN) 1 g in sodium chloride 0.9 % 100 mL IVPB (1 g Intravenous New Bag/Given 10/17/22 1922)  vancomycin (VANCOREADY) IVPB 1750 mg/350 mL (has no administration in time range)  vancomycin (VANCOREADY) IVPB 1250 mg/250 mL (has no administration in time range)  fentaNYL (SUBLIMAZE) injection 50 mcg (50 mcg Intravenous Given 10/17/22 0802)  ondansetron (ZOFRAN) injection 4 mg (4 mg Intravenous Given 10/17/22 0801)  sodium chloride 0.9 % bolus 500 mL (0 mLs Intravenous Stopped 10/17/22 0953)  iohexol (OMNIPAQUE) 350 MG/ML injection 75 mL (75 mLs Intravenous Contrast Given 10/17/22 1042)  fentaNYL (SUBLIMAZE) injection 50 mcg (50 mcg Intravenous Given 10/17/22 1134)  oxyCODONE-acetaminophen (PERCOCET/ROXICET) 5-325 MG per tablet 1 tablet (1 tablet Oral Given 10/17/22 1426)  morphine (PF) 4 MG/ML injection 4 mg (4 mg Intravenous Given 10/17/22 1918)    Mobility manual wheelchair     Focused Assessments Neuro Assessment Handoff:  Swallow screen pass? Yes          Neuro Assessment: Within Defined Limits Neuro Checks:      Has TPA been given? 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

## 2022-10-17 NOTE — H&P (Signed)
[place]   PATIENT NAME: [name]    MR#:  [id number]  DATE OF BIRTH:  1951/02/05  DATE OF ADMISSION:  10/17/2022  PRIMARY CARE PHYSICIAN: Alysia Penna, MD   Patient is coming from: Home  REQUESTING/REFERRING PHYSICIAN: Jacalyn Lefevre, MD   CHIEF COMPLAINT:   Chief Complaint  Patient presents with   Wound Infection    HISTORY OF PRESENT ILLNESS:  [name] is a 72 y.o. 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  He denies any discharge.  Sutures have been intact.  No fever however he admits to chills.  He admits to mild abdominal discomfort without nausea or vomiting or diarrhea or melena or bright red bleeding per rectum.  No chest pain or palpitations.  No cough or wheezing.  ED Course: When he came to the ER, vital signs were within normal.  Labs revealed mild hyponatremia and a creatinine 1.55 with a BNP of 214.3.  High sensitive troponin was 14 and later the same.  CBC showed leukocytosis 11.9 and neutrophilia and as well as anemia with hemoglobin of 10.6 hematocrit 33.9 compared to 14.4 and 46.2 on 6/12.  INR was 1.3 and PT 16.3 with PTT 39.  COVID-19 PCR came back negative.  UA showed more than 500 glucose and large leukocytes with no bacteria and 0-5 WBCs and 30 protein. EKG as reviewed by me : EKG showed paced rhythm with a rate of 60. Imaging: Portable chest x-ray showed low lung volumes with no acute cardiopulmonary disease noncontrasted CT scan showed a retention cyst or polyp in the right maxillary sinus that measures 1.9 cm, previously 1.1 cm there is mostly benign with no acute intracranial normalities.  CTA of the chest  to reveal no PE.  It showed his prostatic aortic valve, aortic atherosclerosis and emphysema and abdominal pelvic CT scan revealed moderate colonic stool, splenomegaly, normal appendix, severe atrophy of the right kidney.  The patient was given IV normal saline bolus, IV vancomycin and 1 p.o. Percocet as well as 4 mg of IV morphine sulfate, 50 mcg of IV fentanyl and a gram of IV Rocephin.  He will be admitted to a medical observation bed for further evaluation and management. PAST MEDICAL HISTORY:   Past Medical History:  Diagnosis Date   Aortic stenosis, mild    Arthritis of hand    "just a little bit in both hands" (03/31/2012)   Asthma    "little bit" (03/31/2012)   Atrial fibrillation (HCC)    dx '04; DCCV '04, placed on flecainide, failed DCCV 04/2010, flecainide stopped; s/p successful A.fib ablation 01/31/12   CHF (congestive heart failure) (HCC)    Controlled type 2 diabetes mellitus without complication, without long-term current use of insulin (HCC) 12/13/2019   COPD (chronic obstructive pulmonary disease) (HCC)    DJD (degenerative joint disease)    Fatty liver    Fibromyalgia    GERD (gastroesophageal reflux disease)    Hyperlipidemia    Hypertension    Hypothyroidism    S/P radiation   Left atrial enlargement    LA size 45mm by echo 11/21/11   Mitral regurgitation    trivial   Obstructive sleep apnea  mild by sleep study 2013; pt stated he does not have a machine because "it wasn't bad enough for him to have one"   Pacemaker 03/31/2012   Pneumonia    last time 2023   Restless leg syndrome    Second degree AV block    Splenomegaly     PAST SURGICAL HISTORY:   Past Surgical History:  Procedure Laterality Date   AMPUTATION Right 08/23/2022   Procedure: RIGHT FOOT FIFTH RAY AMPUTATION;  Surgeon: [name], MD;  Location: [facility] OR;  Service: Orthopedics;  Laterality: Right;   AMPUTATION Right 09/04/2022   Procedure: RIGHT BELOW KNEE AMPUTATION;  Surgeon: [name], MD;  Location: [facility] OR;  Service: Orthopedics;  Laterality: Right;   AORTIC VALVE REPLACEMENT N/A 08/22/2015   Procedure: AORTIC VALVE REPLACEMENT (AVR);  Surgeon: Kerin Perna, MD;  Location: [facility] OR;  Service: Open Heart Surgery;  Laterality: N/A;   ATRIAL FIBRILLATION ABLATION  01/30/2012   PVI by Dr. Johney Frame   ATRIAL FIBRILLATION ABLATION N/A 01/31/2012   Procedure: ATRIAL FIBRILLATION ABLATION;  Surgeon: [name], MD;  Location: [facility] CATH LAB;  Service: Cardiovascular;  Laterality: N/A;   ATRIAL FIBRILLATION ABLATION N/A 04/19/2020   Procedure: ATRIAL FIBRILLATION ABLATION;  Surgeon: [name], MD;  Location: [facility] INVASIVE CV LAB;  Service: Cardiovascular;  Laterality: N/A;   BACK SURGERY     X 3   BIOPSY  01/01/2022   Procedure: BIOPSY;  Surgeon: Kathi Der, MD;  Location: [facility] ENDOSCOPY;  Service: Gastroenterology;;   CARDIAC CATHETERIZATION N/A 08/09/2015   Procedure: Right/Left Heart Cath and Coronary Angiography;  Surgeon: Peter M Swaziland, MD;  Location: [facility] INVASIVE CV LAB;  Service: Cardiovascular;  Laterality: N/A;   CARDIAC CATHETERIZATION N/A 02/05/2016   Procedure: Right/Left Heart Cath and Coronary Angiography;  Surgeon: Corky Crafts, MD;  Location: [facility] INVASIVE CV LAB;  Service: Cardiovascular;  Laterality: N/A;   CARDIAC CATHETERIZATION N/A 04/17/2016   Procedure: Right Heart Cath;  Surgeon: Dolores Patty, MD;  Location: [facility] INVASIVE CV LAB;  Service: Cardiovascular;  Laterality: N/A;   CARDIOVASCULAR STRESS TEST  03/12/2002   EF 48%, NO EVIDENCE OF ISCHEMIA   CARDIOVERSION  01/2012; 03/31/2012   CARDIOVERSION N/A 02/25/2012   Procedure: CARDIOVERSION;  Surgeon: [name], MD;  Location: [facility] CATH LAB;  Service: Cardiovascular;  Laterality: N/A;   CARDIOVERSION N/A 03/31/2012   Procedure: CARDIOVERSION;  Surgeon: [name], MD;  Location: [facility] CATH LAB;  Service: Cardiovascular;  Laterality: N/A;   CARDIOVERSION N/A 11/23/2015   Procedure: CARDIOVERSION;   Surgeon: Laurey Morale, MD;  Location: [facility] ENDOSCOPY;  Service: Cardiovascular;  Laterality: N/A;   CARDIOVERSION N/A 04/08/2016   Procedure: CARDIOVERSION;  Surgeon: Dolores Patty, MD;  Location: [facility] ENDOSCOPY;  Service: Cardiovascular;  Laterality: N/A;   CARDIOVERSION N/A 09/14/2018   Procedure: CARDIOVERSION;  Surgeon: Wendall Stade, MD;  Location: [facility] ENDOSCOPY;  Service: Cardiovascular;  Laterality: N/A;   CARDIOVERSION N/A 10/19/2019   Procedure: CARDIOVERSION;  Surgeon: Dolores Patty, MD;  Location: [facility] ENDOSCOPY;  Service: Cardiovascular;  Laterality: N/A;   CARDIOVERSION N/A 02/25/2020   Procedure: CARDIOVERSION;  Surgeon: Pricilla Riffle, MD;  Location: [facility] ENDOSCOPY;  Service: Cardiovascular;  Laterality: N/A;   CARDIOVERSION N/A 05/25/2020   Procedure: CARDIOVERSION;  Surgeon: Lewayne Bunting, MD;  Location: [facility] ENDOSCOPY;  Service: Cardiovascular;  Laterality: N/A;   CARDIOVERSION N/A 07/10/2020   Procedure: CARDIOVERSION;  Surgeon: Jake Bathe, MD;  Location: [facility] ENDOSCOPY;  Service:  Cardiovascular;  Laterality: N/A;   CLIPPING OF ATRIAL APPENDAGE N/A 08/22/2015   Procedure: CLIPPING OF ATRIAL APPENDAGE;  Surgeon: Kerin Perna, MD;  Location: [facility] OR;  Service: Open Heart Surgery;  Laterality: N/A;   COLONOSCOPY WITH PROPOFOL N/A 01/01/2022   Procedure: COLONOSCOPY WITH PROPOFOL;  Surgeon: Kathi Der, MD;  Location: [facility] ENDOSCOPY;  Service: Gastroenterology;  Laterality: N/A;   DOPPLER ECHOCARDIOGRAPHY  03/11/2002   EF 70-75%   FINGER SURGERY Left    Middle finger   FINGER TENDON REPAIR  1980's   "right little finger" (03/31/2012)   FLEXIBLE SIGMOIDOSCOPY N/A 12/30/2021   Procedure: FLEXIBLE SIGMOIDOSCOPY;  Surgeon: Charlott Rakes, MD;  Location: [facility] ENDOSCOPY;  Service: Gastroenterology;  Laterality: N/A;   INSERT / REPLACE / REMOVE PACEMAKER     St Jude   IR RADIOLOGIST EVAL & MGMT  05/06/2019   IR RADIOLOGIST EVAL & MGMT  10/06/2019   IR RADIOLOGIST EVAL  & MGMT  10/31/2020   IR RADIOLOGIST EVAL & MGMT  01/18/2021   IR RADIOLOGIST EVAL & MGMT  03/08/2021   IR RADIOLOGIST EVAL & MGMT  06/13/2021   KIDNEY SURGERY Left    LEFT AND RIGHT HEART CATHETERIZATION WITH CORONARY ANGIOGRAM N/A 10/27/2012   Procedure: LEFT AND RIGHT HEART CATHETERIZATION WITH CORONARY ANGIOGRAM;  Surgeon: Pamella Pert, MD;  Location: [facility] CATH LAB;  Service: Cardiovascular;  Laterality: N/A;   LUMBAR DISC SURGERY  1980's X2;  2000's   MAZE N/A 08/22/2015   Procedure: MAZE;  Surgeon: Kerin Perna, MD;  Location: [facility] OR;  Service: Open Heart Surgery;  Laterality: N/A;   PACEMAKER INSERTION  03/31/2012   STJ Accent DR pacemaker implanted by Dr Johney Frame   PERMANENT PACEMAKER INSERTION N/A 03/31/2012   Procedure: PERMANENT PACEMAKER INSERTION;  Surgeon: [name], MD;  Location: [facility] CATH LAB;  Service: Cardiovascular;  Laterality: N/A;   PPM GENERATOR CHANGEOUT N/A 08/08/2020   Procedure: PPM GENERATOR CHANGEOUT;  Surgeon: [name], MD;  Location: [facility] INVASIVE CV LAB;  Service: Cardiovascular;  Laterality: N/A;   RADIOLOGY WITH ANESTHESIA Left 02/14/2021   Procedure: RADIOLOGY WITH ANESTHESIA LEFT RENAL CRYOABLATION;  Surgeon: Irish Lack, MD;  Location: [facility] ORS;  Service: Radiology;  Laterality: Left;   STUMP REVISION Right 10/09/2022   Procedure: REVISION RIGHT BELOW KNEE AMPUTATION;  Surgeon: [name], MD;  Location: [facility] OR;  Service: Orthopedics;  Laterality: Right;   TEE WITHOUT CARDIOVERSION  01/30/2012   Procedure: TRANSESOPHAGEAL ECHOCARDIOGRAM (TEE);  Surgeon: Laurey Morale, MD;  Location: [facility] ENDOSCOPY;  Service: Cardiovascular;  Laterality: N/A;  ablation next day   TEE WITHOUT CARDIOVERSION N/A 08/22/2015   Procedure: TRANSESOPHAGEAL ECHOCARDIOGRAM (TEE);  Surgeon: Kerin Perna, MD;  Location: [facility] OR;  Service: Open Heart Surgery;  Laterality: N/A;   TEE WITHOUT CARDIOVERSION N/A 10/19/2019   Procedure: TRANSESOPHAGEAL ECHOCARDIOGRAM (TEE);   Surgeon: Dolores Patty, MD;  Location: [facility] ENDOSCOPY;  Service: Cardiovascular;  Laterality: N/A;   US ECHOCARDIOGRAPHY  08/07/2009   EF 55-60%    SOCIAL HISTORY:   Social History   Tobacco Use   Smoking status: Former    Packs/day: 1.00    Years: 25.00    Additional pack years: 0.00    Total pack years: 25.00    Types: Cigarettes    Start date: 68    Quit date: 07/18/1991    Years since quitting: 31.2    Passive exposure: Never   Smokeless tobacco: Never  Substance Use Topics   Alcohol  use: Not Currently    Comment: occasional    FAMILY HISTORY:   Family History  Problem Relation Age of Onset   Heart failure Mother    Stroke Father    Neuropathy Neg Hx     DRUG ALLERGIES:   Allergies  Allergen Reactions   Meloxicam Rash and Other (See Comments)    Red Man Syndrome, also   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 syndrome 09/02/15, resolved with diphenhydramine and slowing of rate    REVIEW OF SYSTEMS:   ROS As per history of present illness. All pertinent systems were reviewed above. Constitutional, HEENT, cardiovascular, respiratory, GI, GU, musculoskeletal, neuro, psychiatric, endocrine, integumentary and hematologic systems were reviewed and are otherwise negative/unremarkable except for positive findings mentioned above in the HPI.   MEDICATIONS AT HOME:   Prior to Admission medications   Medication Sig Start Date End Date Taking? Authorizing Provider  albuterol (PROVENTIL HFA;VENTOLIN HFA) 108 (90 Base) MCG/ACT inhaler Inhale 2 puffs into the lungs every 6 (six) hours as needed for wheezing or shortness of breath. 12/28/15   Leslye Peer, MD  amLODipine (NORVASC) 5 MG tablet Take 5 mg by mouth in the morning.    [provider]  apixaban (ELIQUIS) 5 MG TABS tablet Take 1 tablet (5 mg total) by mouth 2 (two) times daily. 08/21/22   Graciella Freer, PA-C  bisoprolol (ZEBETA) 5 MG tablet Take 5 mg by mouth daily.    [provider]  Budeson-Glycopyrrol-Formoterol (BREZTRI AEROSPHERE) 160-9-4.8 MCG/ACT AERO Inhale 2 puffs into the lungs in the morning and at bedtime. 05/23/22   Leslye Peer, MD  cephALEXin (KEFLEX) 500 MG capsule Take 1 capsule (500 mg total) by mouth 3 (three) times daily for 10 days. 10/12/22 10/22/22  [name], MD  Cholecalciferol (VITAMIN D) 50 MCG (2000 UT) tablet Take 8,000 Units by mouth daily.    [provider]  Dextromethorphan-guaiFENesin (MUCINEX DM MAXIMUM STRENGTH) 60-1200 MG TB12 Take 1 tablet by mouth daily.    [provider]  doxycycline (VIBRA-TABS) 100 MG tablet Take 1 tablet (100 mg total) by mouth 2 (two) times daily. 10/07/22   [name], MD  empagliflozin (JARDIANCE) 10 MG TABS tablet Take 1 tablet (10 mg total) by mouth daily. 05/27/22   Bensimhon, Bevelyn Buckles, MD  gabapentin (NEURONTIN) 300 MG capsule Take 600 mg by mouth 3 (three) times daily. for neuropathy 09/13/14   [provider]  levothyroxine (SYNTHROID, LEVOTHROID) 175 MCG tablet Take 175 mcg by mouth daily before breakfast.    [provider]  Menthol-Camphor (TIGER BALM ARTHRITIS RUB EX) Apply 1 application topically as needed (for leg pain).    [provider]  montelukast (SINGULAIR) 10 MG tablet TAKE 1 TABLET(10 MG) BY MOUTH AT BEDTIME 06/27/22   Leslye Peer, MD  Multiple Minerals-Vitamins (CAL-MAG-ZINC-D) TABS Take 1 tablet by mouth daily.    [provider]  Multiple Vitamin (MULTIVITAMIN WITH MINERALS) TABS tablet Take 1 tablet by mouth daily. 12/15/19   Marguerita Merles Latif, DO  ondansetron (ZOFRAN) 4 MG tablet Take 1 tablet (4 mg total) by mouth every 8 (eight) hours as needed for nausea or vomiting. 04/26/22   Alicia Amel, MD  oxyCODONE-acetaminophen (PERCOCET/ROXICET) 5-325 MG tablet Take 1 tablet by mouth every 4 (four) hours as needed. 10/12/22   [name], MD  OXYGEN Inhale 2 L/min into the lungs See admin instructions. 2 L/min at  bedtime and 2  L/min throughout the day as needed for shortness of breath    [provider]  pantoprazole (PROTONIX) 40 MG tablet Take 40 mg by mouth daily before breakfast.    [provider]  Polyethyl Glyc-Propyl Glyc PF (SYSTANE ULTRA PF) 0.4-0.3 % SOLN Place 1 drop into both eyes 2 (two) times daily as needed (dry eyes).    [provider]  polyethylene glycol powder (GLYCOLAX/MIRALAX) 17 GM/SCOOP powder Take 17 g by mouth daily as needed for mild constipation. Patient taking differently: Take 17 g by mouth daily. 01/23/20   Glade Lloyd, MD  potassium chloride SA (KLOR-CON M) 20 MEQ tablet Take 20 mEq by mouth See admin instructions. Take 20 meq twice daily every other day    [provider]  predniSONE (DELTASONE) 5 MG tablet Take 0.5 tablets (2.5 mg total) by mouth daily with breakfast. 10/22/21   Byrum, Les Pou, MD  rOPINIRole (REQUIP) 1 MG tablet Take 1 tablet (1 mg total) by mouth in the morning and at bedtime. 01/23/20   Glade Lloyd, MD  rosuvastatin (CRESTOR) 10 MG tablet Take 10 mg by mouth at bedtime.  08/08/16   [provider]  senna-docusate (SENOKOT-S) 8.6-50 MG tablet Take 1 tablet by mouth 2 (two) times daily.    [provider]  spironolactone (ALDACTONE) 25 MG tablet Take 1 tablet (25 mg total) by mouth daily. 02/16/16   Graciella Freer, PA-C  sucralfate (CARAFATE) 1 g tablet Take 1 g by mouth 3 (three) times daily before meals. 03/27/22   [provider]  sulfamethoxazole-trimethoprim (BACTRIM DS) 800-160 MG tablet Take 1 tablet by mouth 2 (two) times daily. 10/13/22   [name], MD  torsemide (DEMADEX) 10 MG tablet Take 15 mg by mouth every other day.    [provider]  traZODone (DESYREL) 50 MG tablet Take 50 mg by mouth at bedtime. 08/30/20   [provider]      VITAL SIGNS:  Blood pressure 132/76, pulse 74, temperature 98.1 F (36.7 C), temperature source Oral, resp. rate 16,  height 5\' 11"  (1.803 m), weight 81.2 kg, SpO2 99 %.  PHYSICAL EXAMINATION:  Physical Exam  GENERAL:  72 y.o.-year-old Caucasian male patient lying in the bed with no acute distress.  EYES: Pupils equal, round, reactive to light and accommodation. No scleral icterus. Extraocular muscles intact.  HEENT: Head atraumatic, normocephalic. Oropharynx and nasopharynx clear.  NECK:  Supple, no jugular venous distention. No thyroid enlargement, no tenderness.  LUNGS: Normal breath sounds bilaterally, no wheezing, rales,rhonchi or crepitation. No use of accessory muscles of respiration.  CARDIOVASCULAR: Regular rate and rhythm, S1, S2 normal. No murmurs, rubs, or gallops.  ABDOMEN: Soft, nondistended, nontender. Bowel sounds present. No organomegaly or mass.  EXTREMITIES: No pedal edema, cyanosis, or clubbing.  NEUROLOGIC: Cranial nerves II through XII are intact. Muscle strength 5/5 in all extremities. Sensation intact. Gait not checked.  PSYCHIATRIC: The patient is alert and oriented x 3.  Normal affect and good eye contact. SKIN: Right BKA stump mild swelling with erythema and tenderness with warmth without discharge and with intact sutures.     LABORATORY PANEL:   CBC Recent Labs  Lab 10/17/22 0800  WBC 11.9*  HGB 10.6*  HCT 33.9*  PLT 193   ------------------------------------------------------------------------------------------------------------------  Chemistries  Recent Labs  Lab 10/17/22 0800  NA 133*  K 4.5  CL 101  CO2 22  GLUCOSE 104*  BUN 22  CREATININE 1.55*  CALCIUM 9.2  AST 18  ALT 15  ALKPHOS 76  BILITOT 0.5   ------------------------------------------------------------------------------------------------------------------  Cardiac Enzymes No results for input(s): "TROPONINI" in the last 168 hours. ------------------------------------------------------------------------------------------------------------------  RADIOLOGY:  CT ABDOMEN PELVIS W  CONTRAST  Result Date: 10/17/2022 CLINICAL DATA:  Sepsis.  Pain.  Recent renal ablation EXAM: CT ABDOMEN AND PELVIS WITH CONTRAST TECHNIQUE: Multidetector CT imaging of the abdomen and pelvis was performed using the standard protocol following bolus administration of intravenous contrast. RADIATION DOSE REDUCTION: This exam was performed according to the departmental dose-optimization program which includes automated exposure control, adjustment of the mA and/or kV according to patient size and/or use of iterative reconstruction technique. CONTRAST:  75mL OMNIPAQUE IOHEXOL 350 MG/ML SOLN COMPARISON:  Noncontrast CT 06/24/2018 FINDINGS: Lower chest: Please see separate CT angiogram of the chest from same day. Hepatobiliary: No space-occupying liver lesion. Patent portal vein. Small stone dependently in the nondilated gallbladder. Pancreas: Moderate atrophy of the pancreas. Spleen: Spleen is enlarged with AP length of 16.5 cm, similar to previous. Adrenals/Urinary Tract: The adrenal glands are preserved. There are areas of severe atrophy along the right kidney. No separate enhancing mass or collecting system dilatation. The right ureter has a normal course and caliber down to the bladder. Bladder is underdistended but has a preserved contour. Left kidney has a focal low-attenuation defect posteriorly in the midportion consistent with previous intervention. The amount of adjacent stranding is similar. Separate tiny Bosniak 2 adjacent cyst and separate lower pole cyst. No specific imaging follow-up of the cystic areas. No collecting system dilatation. The left ureter has a normal course and caliber down to the bladder. Stomach/Bowel: Stomach is collapsed. No oral contrast. Small bowel has a overall normal course and caliber. Normal appendix in the right lower quadrant. Moderate diffuse colonic stool. Redundant course of the left side of the colon and sigmoid colon. Vascular/Lymphatic: Aortic atherosclerosis. No  enlarged abdominal or pelvic lymph nodes. Reproductive: Prostate is unremarkable. Other: Small fat containing inguinal hernias, left-greater-than-right. Small fat containing umbilical hernia. No free air or free fluid. Musculoskeletal: Scattered degenerative changes of the spine and pelvis. IMPRESSION: Moderate colonic stool. No obstruction, free air or free fluid. Normal appendix. Splenomegaly. Severe atrophy of the right kidney. Posttreatment defect on the left, similar to previous. Please see separate dictation of CT angiogram of the chest Electronically Signed   By: Karen Kays M.D.   On: 10/17/2022 11:17   CT Head Wo Contrast  Result Date: 10/17/2022 CLINICAL DATA:  Headache EXAM: CT HEAD WITHOUT CONTRAST TECHNIQUE: Contiguous axial images were obtained from the base of the skull through the vertex without intravenous contrast. RADIATION DOSE REDUCTION: This exam was performed according to the departmental dose-optimization program which includes automated exposure control, adjustment of the mA and/or kV according to patient size and/or use of iterative reconstruction technique. COMPARISON:  05/13/2022 FINDINGS: Brain: No evidence of acute infarction, hemorrhage, mass, mass effect, or midline shift. No hydrocephalus or extra-axial fluid collection. Vascular: No hyperdense vessel. Skull: Negative for fracture or focal lesion. Sinuses/Orbits: No acute finding. Redemonstrated retention cyst or polyp in the right maxillary sinus, which measures up to 1.9 cm, previously 1.4 cm on 06/09/2021 and 1.1 cm on 10/17/2019. Other: The mastoid air cells are well aerated. IMPRESSION: 1. No acute intracranial process. 2. Redemonstrated retention cyst or polyp in the right maxillary sinus, which measures up to 1.9 cm, previously 1.1 cm on 10/17/2019. This is most likely benign, but consider non emergent ENT referral given interval growth. Electronically Signed   By: Jill Side  Vasan M.D.   On: 10/17/2022 11:16   CT Angio  Chest PE W and/or Wo Contrast  Result Date: 10/17/2022 CLINICAL DATA:  Chest tightness. Postop pain recent renal cell carcinoma ablation. EXAM: CT ANGIOGRAPHY CHEST WITH CONTRAST TECHNIQUE: Multidetector CT imaging of the chest was performed using the standard protocol during bolus administration of intravenous contrast. Multiplanar CT image reconstructions and MIPs were obtained to evaluate the vascular anatomy. RADIATION DOSE REDUCTION: This exam was performed according to the departmental dose-optimization program which includes automated exposure control, adjustment of the mA and/or kV according to patient size and/or use of iterative reconstruction technique. CONTRAST:  75mL OMNIPAQUE IOHEXOL 350 MG/ML SOLN COMPARISON:  Chest x-ray earlier 10/17/2022. CT angiogram chest 06/20/2022 FINDINGS: Cardiovascular: There is significant breathing motion identified throughout the examination. This limits evaluation, nondiagnostic for small and peripheral emboli. No segmental or larger embolus identified. Heart is borderline in size. No significant pericardial effusion. There is a battery pack along the left upper chest with leads extending along the right side of the heart. Status post median sternotomy with prosthetic aortic valve. The thoracic aorta has scattered vascular calcifications. Contrast is predominantly in the pulmonary arterial tree and very little along the aorta. Mediastinum/Nodes: Small thyroid gland. No specific abnormal lymph node enlargement identified in the axillary region or hilum. There are some small mediastinal nodes seen diffusely, nonpathologic by size criteria. Slightly more numerous than usually seen but unchanged from the previous examination. Normal caliber thoracic esophagus. Lungs/Pleura: Extensive breathing motion. There are some centrilobular emphysematous changes identified. No consolidation, pneumothorax or effusion. Noncalcified right upper lobe nodule identified on series 5, image  46 measuring 4 mm. Unchanged from the previous exam. This is stable going back to a study of June 2021 demonstrating long-term stability. No additional imaging follow-up. Calcified nodule left lower lobe on series 5, image 89 is also seen consistent with old granulomatous disease and also stable. Upper Abdomen: Adrenal glands are preserved in the upper abdomen. Please see separate dictation of abdomen and pelvis CT from same day Musculoskeletal: Scattered degenerative changes along the spine. Old healed right-sided rib fracture. Review of the MIP images confirms the above findings. IMPRESSION: Extensive breathing motion.  No large or central embolus. Postop chest.  Pacemaker.  Prosthetic aortic valve Aortic Atherosclerosis (ICD10-I70.0) and Emphysema (ICD10-J43.9). Electronically Signed   By: Karen Kays M.D.   On: 10/17/2022 11:11   DG Tibia/Fibula Right  Result Date: 10/17/2022 CLINICAL DATA:  Bleeding from new amputation. EXAM: RIGHT TIBIA AND FIBULA - 2 VIEW COMPARISON:  Right tibia/fibula radiographs 05/29/2020 FINDINGS: The patient is status post below-knee amputation. There is no cortical irregularity or destruction along the amputation margins. There is no acute fracture. Knee alignment is normal. The joint spaces are preserved. The soft tissues are unremarkable. There is no suprapatellar effusion. There is no soft tissue gas or radiopaque foreign body. IMPRESSION: 1. Status post below-knee amputation. No cortical irregularity or destruction along the amputation margins. 2. No acute finding. Electronically Signed   By: Lesia Hausen M.D.   On: 10/17/2022 08:21   DG Chest Port 1 View  Result Date: 10/17/2022 CLINICAL DATA:  Chest tightness, postoperative pain. EXAM: PORTABLE CHEST 1 VIEW COMPARISON:  06/20/2022. FINDINGS: Left chest dual-chamber pacemaker with leads projecting over the right atrium and ventricle. Low lung volumes accentuate the pulmonary vasculature and cardiomediastinal silhouette.  No consolidation or pulmonary edema. Stable postoperative changes of median sternotomy, aortic valve replacement and left atrial appendage clipping. No pleural effusion or pneumothorax.  Visualized bones and upper abdomen are IMPRESSION: Low lung volumes without evidence of acute cardiopulmonary disease. Electronically Signed   By: Orvan Falconer M.D.   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 - He will be admitted to an observation medical bed. - Will continue antibiotic therapy with IV Rocephin. - Warm compresses will be applied. - Follow-up orthopedic consult to be obtained. - Dr. Lajoyce Corners was notified and is aware about the patient.  Hypothyroidism We will continue Synthroid.  Dyslipidemia - We will continue statin therapy.  GERD without esophagitis - We will continue PPI therapy as well as Carafate.  Essential hypertension - We will continue his antihypertensive therapy.  PAF (paroxysmal atrial fibrillation) (HCC) - Continue Eliquis and Zebeta       DVT prophylaxis: Eliquis. Advanced Care Planning:  Code Status: The patient is DNR and DNI. Family Communication:  The plan of care was discussed in details with the patient (and family). I answered all questions. The patient agreed to proceed with the above mentioned plan. Further management will depend upon hospital course. Disposition Plan: Back to previous home environment Consults called: Dr. Lajoyce Corners was notified. All the records are reviewed and case discussed with ED provider.  Status is: Observation   I certify that at the time of admission, it is my clinical judgment that the patient will require  hospital care extending less than 2 midnights.                            Dispo: The patient is from: Home              Anticipated d/c is to: Home              Patient currently is not medically stable  to d/c.              Difficult to place patient: No  Hannah Beat M.D on 10/18/2022 at 1:08 AM  [facility]   From 7 PM-7 AM, contact night-coverage [email or web address]  CC: Primary care physician; Alysia Penna, MD

## 2022-10-17 NOTE — ED Notes (Signed)
X-ray at bedside

## 2022-10-17 NOTE — ED Notes (Signed)
Laurice Record, wife, said to call for updates and questions, status change, [phone].

## 2022-10-17 NOTE — ED Provider Notes (Signed)
Pt signed out by Dr. Criss Alvine pending Dr. Lajoyce Corners eval.  Pt does have some erythema and increased tenderness of right BKA stump.  Dr. Lajoyce Corners recommends admission for pain control and IV abx.  He will see pt in the am.  Pt d/w Dr. Arville Care ([facility]) for admission.   Jacalyn Lefevre, MD 10/17/22 [phone]

## 2022-10-17 NOTE — ED Provider Notes (Signed)
[place] EMERGENCY DEPARTMENT AT [facility] Provider Note   CSN: [id number] Arrival date & time: 10/17/22  0701     History  Chief Complaint  Patient presents with   Wound Infection    [name] is a 72 y.o. male.  HPI 72 year old male presents with multiple complaints.  History is primarily from the wife but somewhat from the patient.  8 days ago he had a revision to his right BKA.  He has been on antibiotics as his wound cultures were positive.  He has been on Bactrim.  However since yesterday he has been dealing with multiple other symptoms in addition to the chronic pain in his right leg.  He has had chest tightness, shortness of breath, mild cough, abdominal pain, nausea.  He is having normal bowel movements.  He is also having a gradually worsening headache though no neck stiffness or AMS.  No confusion.  He had some chills last night but no reported fevers.  He has been taking oxycodone with Tylenol.  He did not take the antibiotics last night due to how poor he was feeling.  Feels like the leg is a little more red than what it has been.  Some drainage.  Dr. Lajoyce Corners performed the surgery.  On asking multiple different ways, it seems like the discomfort in his legs/stump is similar to what has been since the original surgery back in May.  All the other symptoms are new/concerning. He's been compliant with Eliquis. He wears oxygen PRN.  Home Medications Prior to Admission medications   Medication Sig Start Date End Date Taking? Authorizing Provider  albuterol (PROVENTIL HFA;VENTOLIN HFA) 108 (90 Base) MCG/ACT inhaler Inhale 2 puffs into the lungs every 6 (six) hours as needed for wheezing or shortness of breath. 12/28/15   Leslye Peer, MD  amLODipine (NORVASC) 5 MG tablet Take 5 mg by mouth in the morning.    [provider]  apixaban (ELIQUIS) 5 MG TABS tablet Take 1 tablet (5 mg total) by mouth 2 (two) times daily. 08/21/22   Graciella Freer, PA-C   bisoprolol (ZEBETA) 5 MG tablet Take 5 mg by mouth daily.    [provider]  Budeson-Glycopyrrol-Formoterol (BREZTRI AEROSPHERE) 160-9-4.8 MCG/ACT AERO Inhale 2 puffs into the lungs in the morning and at bedtime. 05/23/22   Leslye Peer, MD  cephALEXin (KEFLEX) 500 MG capsule Take 1 capsule (500 mg total) by mouth 3 (three) times daily for 10 days. 10/12/22 10/22/22  [name], MD  Cholecalciferol (VITAMIN D) 50 MCG (2000 UT) tablet Take 8,000 Units by mouth daily.    [provider]  Dextromethorphan-guaiFENesin (MUCINEX DM MAXIMUM STRENGTH) 60-1200 MG TB12 Take 1 tablet by mouth daily.    [provider]  doxycycline (VIBRA-TABS) 100 MG tablet Take 1 tablet (100 mg total) by mouth 2 (two) times daily. 10/07/22   [name], MD  empagliflozin (JARDIANCE) 10 MG TABS tablet Take 1 tablet (10 mg total) by mouth daily. 05/27/22   Bensimhon, Bevelyn Buckles, MD  gabapentin (NEURONTIN) 300 MG capsule Take 600 mg by mouth 3 (three) times daily. for neuropathy 09/13/14   [provider]  levothyroxine (SYNTHROID, LEVOTHROID) 175 MCG tablet Take 175 mcg by mouth daily before breakfast.    [provider]  Menthol-Camphor (TIGER BALM ARTHRITIS RUB EX) Apply 1 application topically as needed (for leg pain).    [provider]  montelukast (SINGULAIR) 10 MG tablet TAKE 1 TABLET(10 MG) BY  MOUTH AT BEDTIME 06/27/22   Leslye Peer, MD  Multiple Minerals-Vitamins (CAL-MAG-ZINC-D) TABS Take 1 tablet by mouth daily.    [provider]  Multiple Vitamin (MULTIVITAMIN WITH MINERALS) TABS tablet Take 1 tablet by mouth daily. 12/15/19   Marguerita Merles Latif, DO  ondansetron (ZOFRAN) 4 MG tablet Take 1 tablet (4 mg total) by mouth every 8 (eight) hours as needed for nausea or vomiting. 04/26/22   Alicia Amel, MD  oxyCODONE-acetaminophen (PERCOCET/ROXICET) 5-325 MG tablet Take 1 tablet by mouth every 4 (four) hours as needed. 10/12/22   [name], MD   OXYGEN Inhale 2 L/min into the lungs See admin instructions. 2 L/min at bedtime and 2 L/min throughout the day as needed for shortness of breath    [provider]  pantoprazole (PROTONIX) 40 MG tablet Take 40 mg by mouth daily before breakfast.    [provider]  Polyethyl Glyc-Propyl Glyc PF (SYSTANE ULTRA PF) 0.4-0.3 % SOLN Place 1 drop into both eyes 2 (two) times daily as needed (dry eyes).    [provider]  polyethylene glycol powder (GLYCOLAX/MIRALAX) 17 GM/SCOOP powder Take 17 g by mouth daily as needed for mild constipation. Patient taking differently: Take 17 g by mouth daily. 01/23/20   Glade Lloyd, MD  potassium chloride SA (KLOR-CON M) 20 MEQ tablet Take 20 mEq by mouth See admin instructions. Take 20 meq twice daily every other day    [provider]  predniSONE (DELTASONE) 5 MG tablet Take 0.5 tablets (2.5 mg total) by mouth daily with breakfast. 10/22/21   Byrum, Les Pou, MD  rOPINIRole (REQUIP) 1 MG tablet Take 1 tablet (1 mg total) by mouth in the morning and at bedtime. 01/23/20   Glade Lloyd, MD  rosuvastatin (CRESTOR) 10 MG tablet Take 10 mg by mouth at bedtime.  08/08/16   [provider]  senna-docusate (SENOKOT-S) 8.6-50 MG tablet Take 1 tablet by mouth 2 (two) times daily.    [provider]  spironolactone (ALDACTONE) 25 MG tablet Take 1 tablet (25 mg total) by mouth daily. 02/16/16   Graciella Freer, PA-C  sucralfate (CARAFATE) 1 g tablet Take 1 g by mouth 3 (three) times daily before meals. 03/27/22   [provider]  sulfamethoxazole-trimethoprim (BACTRIM DS) 800-160 MG tablet Take 1 tablet by mouth 2 (two) times daily. 10/13/22   [name], MD  torsemide (DEMADEX) 10 MG tablet Take 15 mg by mouth every other day.    [provider]  traZODone (DESYREL) 50 MG tablet Take 50 mg by mouth at bedtime. 08/30/20   [provider]      Allergies    Meloxicam and Vancomycin     Review of Systems   Review of Systems  Constitutional:  Positive for chills. Negative for fever.  Respiratory:  Positive for cough, chest tightness and shortness of breath.   Cardiovascular:  Negative for leg swelling.  Gastrointestinal:  Positive for abdominal pain and nausea. Negative for constipation and vomiting.  Musculoskeletal:  Positive for arthralgias.  Neurological:  Positive for headaches.    Physical Exam Updated Vital Signs BP 126/66   Pulse 62   Temp 98 F (36.7 C)   Resp 19   Ht 5\' 11"  (1.803 m)   Wt 81.2 kg   SpO2 100%   BMI 24.97 kg/m  Physical Exam Vitals and nursing note reviewed.  Constitutional:      Appearance: He is well-developed.  HENT:  Head: Normocephalic and atraumatic.  Cardiovascular:     Rate and Rhythm: Normal rate and regular rhythm.     Heart sounds: Normal heart sounds.  Pulmonary:     Effort: Pulmonary effort is normal.     Breath sounds: Normal breath sounds. No wheezing.  Abdominal:     General: There is no distension.     Palpations: Abdomen is soft.     Tenderness: There is generalized abdominal tenderness.  Musculoskeletal:     Cervical back: No rigidity.     Comments: See picture of right BKA. Some erythema. No overt drainage. Diffuse tenderness  Skin:    General: Skin is warm and dry.  Neurological:     Mental Status: He is alert.        ED Results / Procedures / Treatments   Labs (all labs ordered are listed, but only abnormal results are displayed) Labs Reviewed  COMPREHENSIVE METABOLIC PANEL - Abnormal; Notable for the following components:      Result Value   Sodium 133 (*)    Glucose, Bld 104 (*)    Creatinine, Ser 1.55 (*)    Albumin 3.3 (*)    GFR, Estimated 47 (*)    All other components within normal limits  CBC WITH DIFFERENTIAL/PLATELET - Abnormal; Notable for the following components:   WBC 11.9 (*)    RBC 3.65 (*)    Hemoglobin 10.6 (*)    HCT 33.9 (*)    Neutro Abs 9.7 (*)    Monocytes  Absolute 1.2 (*)    Abs Immature Granulocytes 0.12 (*)    All other components within normal limits  PROTIME-INR - Abnormal; Notable for the following components:   Prothrombin Time 16.3 (*)    INR 1.3 (*)    All other components within normal limits  APTT - Abnormal; Notable for the following components:   aPTT 39 (*)    All other components within normal limits  BRAIN NATRIURETIC PEPTIDE - Abnormal; Notable for the following components:   B Natriuretic Peptide 214.3 (*)    All other components within normal limits  URINALYSIS, W/ REFLEX TO CULTURE (INFECTION SUSPECTED) - Abnormal; Notable for the following components:   Glucose, UA >=500 (*)    Protein, ur 30 (*)    Leukocytes,Ua LARGE (*)    All other components within normal limits  SARS CORONAVIRUS 2 BY RT PCR  CULTURE, BLOOD (ROUTINE X 2)  CULTURE, BLOOD (ROUTINE X 2)  LACTIC ACID, PLASMA  LIPASE, BLOOD  TROPONIN I (HIGH SENSITIVITY)  TROPONIN I (HIGH SENSITIVITY)    EKG EKG Interpretation  Date/Time:  Thursday October 17 2022 07:12:16 EDT Ventricular Rate:  60 PR Interval:    QRS Duration: 183 QT Interval:  472 QTC Calculation: 472 R Axis:   -69 Text Interpretation: paced rhythm Nonspecific IVCD with LAD LVH with secondary repolarization abnormality Inferior infarct, old Anterior Q waves, possibly due to LVH similar to Feb 2024 Confirmed by Pricilla Loveless [phone]) on 10/17/2022 7:15:08 AM  Radiology CT ABDOMEN PELVIS W CONTRAST  Result Date: 10/17/2022 CLINICAL DATA:  Sepsis.  Pain.  Recent renal ablation EXAM: CT ABDOMEN AND PELVIS WITH CONTRAST TECHNIQUE: Multidetector CT imaging of the abdomen and pelvis was performed using the standard protocol following bolus administration of intravenous contrast. RADIATION DOSE REDUCTION: This exam was performed according to the departmental dose-optimization program which includes automated exposure control, adjustment of the mA and/or kV according to patient size and/or use of  iterative reconstruction technique. CONTRAST:  75mL OMNIPAQUE IOHEXOL 350 MG/ML SOLN COMPARISON:  Noncontrast CT 06/24/2018 FINDINGS: Lower chest: Please see separate CT angiogram of the chest from same day. Hepatobiliary: No space-occupying liver lesion. Patent portal vein. Small stone dependently in the nondilated gallbladder. Pancreas: Moderate atrophy of the pancreas. Spleen: Spleen is enlarged with AP length of 16.5 cm, similar to previous. Adrenals/Urinary Tract: The adrenal glands are preserved. There are areas of severe atrophy along the right kidney. No separate enhancing mass or collecting system dilatation. The right ureter has a normal course and caliber down to the bladder. Bladder is underdistended but has a preserved contour. Left kidney has a focal low-attenuation defect posteriorly in the midportion consistent with previous intervention. The amount of adjacent stranding is similar. Separate tiny Bosniak 2 adjacent cyst and separate lower pole cyst. No specific imaging follow-up of the cystic areas. No collecting system dilatation. The left ureter has a normal course and caliber down to the bladder. Stomach/Bowel: Stomach is collapsed. No oral contrast. Small bowel has a overall normal course and caliber. Normal appendix in the right lower quadrant. Moderate diffuse colonic stool. Redundant course of the left side of the colon and sigmoid colon. Vascular/Lymphatic: Aortic atherosclerosis. No enlarged abdominal or pelvic lymph nodes. Reproductive: Prostate is unremarkable. Other: Small fat containing inguinal hernias, left-greater-than-right. Small fat containing umbilical hernia. No free air or free fluid. Musculoskeletal: Scattered degenerative changes of the spine and pelvis. IMPRESSION: Moderate colonic stool. No obstruction, free air or free fluid. Normal appendix. Splenomegaly. Severe atrophy of the right kidney. Posttreatment defect on the left, similar to previous. Please see separate  dictation of CT angiogram of the chest Electronically Signed   By: Karen Kays M.D.   On: 10/17/2022 11:17   CT Head Wo Contrast  Result Date: 10/17/2022 CLINICAL DATA:  Headache EXAM: CT HEAD WITHOUT CONTRAST TECHNIQUE: Contiguous axial images were obtained from the base of the skull through the vertex without intravenous contrast. RADIATION DOSE REDUCTION: This exam was performed according to the departmental dose-optimization program which includes automated exposure control, adjustment of the mA and/or kV according to patient size and/or use of iterative reconstruction technique. COMPARISON:  05/13/2022 FINDINGS: Brain: No evidence of acute infarction, hemorrhage, mass, mass effect, or midline shift. No hydrocephalus or extra-axial fluid collection. Vascular: No hyperdense vessel. Skull: Negative for fracture or focal lesion. Sinuses/Orbits: No acute finding. Redemonstrated retention cyst or polyp in the right maxillary sinus, which measures up to 1.9 cm, previously 1.4 cm on 06/09/2021 and 1.1 cm on 10/17/2019. Other: The mastoid air cells are well aerated. IMPRESSION: 1. No acute intracranial process. 2. Redemonstrated retention cyst or polyp in the right maxillary sinus, which measures up to 1.9 cm, previously 1.1 cm on 10/17/2019. This is most likely benign, but consider non emergent ENT referral given interval growth. Electronically Signed   By: Wiliam Ke M.D.   On: 10/17/2022 11:16   CT Angio Chest PE W and/or Wo Contrast  Result Date: 10/17/2022 CLINICAL DATA:  Chest tightness. Postop pain recent renal cell carcinoma ablation. EXAM: CT ANGIOGRAPHY CHEST WITH CONTRAST TECHNIQUE: Multidetector CT imaging of the chest was performed using the standard protocol during bolus administration of intravenous contrast. Multiplanar CT image reconstructions and MIPs were obtained to evaluate the vascular anatomy. RADIATION DOSE REDUCTION: This exam was performed according to the departmental  dose-optimization program which includes automated exposure control, adjustment of the mA and/or kV according to patient size and/or use of iterative reconstruction technique. CONTRAST:  75mL OMNIPAQUE IOHEXOL 350  MG/ML SOLN COMPARISON:  Chest x-ray earlier 10/17/2022. CT angiogram chest 06/20/2022 FINDINGS: Cardiovascular: There is significant breathing motion identified throughout the examination. This limits evaluation, nondiagnostic for small and peripheral emboli. No segmental or larger embolus identified. Heart is borderline in size. No significant pericardial effusion. There is a battery pack along the left upper chest with leads extending along the right side of the heart. Status post median sternotomy with prosthetic aortic valve. The thoracic aorta has scattered vascular calcifications. Contrast is predominantly in the pulmonary arterial tree and very little along the aorta. Mediastinum/Nodes: Small thyroid gland. No specific abnormal lymph node enlargement identified in the axillary region or hilum. There are some small mediastinal nodes seen diffusely, nonpathologic by size criteria. Slightly more numerous than usually seen but unchanged from the previous examination. Normal caliber thoracic esophagus. Lungs/Pleura: Extensive breathing motion. There are some centrilobular emphysematous changes identified. No consolidation, pneumothorax or effusion. Noncalcified right upper lobe nodule identified on series 5, image 46 measuring 4 mm. Unchanged from the previous exam. This is stable going back to a study of June 2021 demonstrating long-term stability. No additional imaging follow-up. Calcified nodule left lower lobe on series 5, image 89 is also seen consistent with old granulomatous disease and also stable. Upper Abdomen: Adrenal glands are preserved in the upper abdomen. Please see separate dictation of abdomen and pelvis CT from same day Musculoskeletal: Scattered degenerative changes along the spine.  Old healed right-sided rib fracture. Review of the MIP images confirms the above findings. IMPRESSION: Extensive breathing motion.  No large or central embolus. Postop chest.  Pacemaker.  Prosthetic aortic valve Aortic Atherosclerosis (ICD10-I70.0) and Emphysema (ICD10-J43.9). Electronically Signed   By: Karen Kays M.D.   On: 10/17/2022 11:11   DG Tibia/Fibula Right  Result Date: 10/17/2022 CLINICAL DATA:  Bleeding from new amputation. EXAM: RIGHT TIBIA AND FIBULA - 2 VIEW COMPARISON:  Right tibia/fibula radiographs 05/29/2020 FINDINGS: The patient is status post below-knee amputation. There is no cortical irregularity or destruction along the amputation margins. There is no acute fracture. Knee alignment is normal. The joint spaces are preserved. The soft tissues are unremarkable. There is no suprapatellar effusion. There is no soft tissue gas or radiopaque foreign body. IMPRESSION: 1. Status post below-knee amputation. No cortical irregularity or destruction along the amputation margins. 2. No acute finding. Electronically Signed   By: Lesia Hausen M.D.   On: 10/17/2022 08:21   DG Chest Port 1 View  Result Date: 10/17/2022 CLINICAL DATA:  Chest tightness, postoperative pain. EXAM: PORTABLE CHEST 1 VIEW COMPARISON:  06/20/2022. FINDINGS: Left chest dual-chamber pacemaker with leads projecting over the right atrium and ventricle. Low lung volumes accentuate the pulmonary vasculature and cardiomediastinal silhouette. No consolidation or pulmonary edema. Stable postoperative changes of median sternotomy, aortic valve replacement and left atrial appendage clipping. No pleural effusion or pneumothorax. Visualized bones and upper abdomen are IMPRESSION: Low lung volumes without evidence of acute cardiopulmonary disease. Electronically Signed   By: Orvan Falconer M.D.   On: 10/17/2022 08:19    Procedures Procedures    Medications Ordered in ED Medications  fentaNYL (SUBLIMAZE) injection 50 mcg (50 mcg  Intravenous Given 10/17/22 0802)  ondansetron (ZOFRAN) injection 4 mg (4 mg Intravenous Given 10/17/22 0801)  sodium chloride 0.9 % bolus 500 mL (0 mLs Intravenous Stopped 10/17/22 0953)  iohexol (OMNIPAQUE) 350 MG/ML injection 75 mL (75 mLs Intravenous Contrast Given 10/17/22 1042)  fentaNYL (SUBLIMAZE) injection 50 mcg (50 mcg Intravenous Given 10/17/22 1134)  oxyCODONE-acetaminophen (PERCOCET/ROXICET) 5-325  MG per tablet 1 tablet (1 tablet Oral Given 10/17/22 1426)    ED Course/ Medical Decision Making/ A&P                             Medical Decision Making Amount and/or Complexity of Data Reviewed Labs: ordered.    Details: Troponins negative x 2 Cr improved from baseline Leukocytes in urine but 0 WBCs Radiology: ordered and independent interpretation performed.    Details: CT head no head bleed CTA no PE No SBO on CT abd ECG/medicine tests: ordered and independent interpretation performed.    Details: No ischemia  Risk Prescription drug management.   Patient presents with multiple areas of pain, especially his right leg.  See the above pictures.  No obvious infection though he is currently being treated with what seems to be appropriate antibiotics for the wound cultures obtained during surgery.  Further workup for these multiple other complaints is negative.  Some leukocytes in his urine but does not seem to have dysuria and there is 0-5 WBCs I think UTI is unlikely.  For now he has been given IV and oral pain control.  Discussed with Earney Hamburg, orthopedics, who has discussed with his surgeon Dr. Lajoyce Corners.  Dr. Lajoyce Corners will come see in the ED in consultation to discuss treatment as well as admission versus discharge.  Care transferred to Dr. Particia Nearing.        Final Clinical Impression(s) / ED Diagnoses Final diagnoses:  None    Rx / DC Orders ED Discharge Orders     None         Pricilla Loveless, MD 10/17/22 1556

## 2022-10-17 NOTE — ED Notes (Signed)
NAD noted, respirations are equal bilaterally and unlabored at this time. Pt resting in gurney and denies any unmet needs. Pt connected to CCM, pulseox & BP. Call light within reach. 

## 2022-10-18 DIAGNOSIS — Y835 Amputation of limb(s) as the cause of abnormal reaction of the patient, or of later complication, without mention of misadventure at the time of the procedure: Secondary | ICD-10-CM | POA: Diagnosis present

## 2022-10-18 DIAGNOSIS — L089 Local infection of the skin and subcutaneous tissue, unspecified: Secondary | ICD-10-CM | POA: Diagnosis not present

## 2022-10-18 DIAGNOSIS — R52 Pain, unspecified: Secondary | ICD-10-CM | POA: Diagnosis not present

## 2022-10-18 DIAGNOSIS — G8929 Other chronic pain: Secondary | ICD-10-CM | POA: Diagnosis present

## 2022-10-18 DIAGNOSIS — Z9981 Dependence on supplemental oxygen: Secondary | ICD-10-CM | POA: Diagnosis not present

## 2022-10-18 DIAGNOSIS — Z1152 Encounter for screening for COVID-19: Secondary | ICD-10-CM | POA: Diagnosis not present

## 2022-10-18 DIAGNOSIS — Z87891 Personal history of nicotine dependence: Secondary | ICD-10-CM | POA: Diagnosis not present

## 2022-10-18 DIAGNOSIS — G2581 Restless legs syndrome: Secondary | ICD-10-CM | POA: Diagnosis present

## 2022-10-18 DIAGNOSIS — Z66 Do not resuscitate: Secondary | ICD-10-CM | POA: Diagnosis present

## 2022-10-18 DIAGNOSIS — Z881 Allergy status to other antibiotic agents status: Secondary | ICD-10-CM | POA: Diagnosis not present

## 2022-10-18 DIAGNOSIS — E039 Hypothyroidism, unspecified: Secondary | ICD-10-CM | POA: Diagnosis present

## 2022-10-18 DIAGNOSIS — J439 Emphysema, unspecified: Secondary | ICD-10-CM | POA: Diagnosis present

## 2022-10-18 DIAGNOSIS — L03115 Cellulitis of right lower limb: Secondary | ICD-10-CM | POA: Diagnosis present

## 2022-10-18 DIAGNOSIS — E785 Hyperlipidemia, unspecified: Secondary | ICD-10-CM | POA: Insufficient documentation

## 2022-10-18 DIAGNOSIS — K219 Gastro-esophageal reflux disease without esophagitis: Secondary | ICD-10-CM | POA: Diagnosis present

## 2022-10-18 DIAGNOSIS — Z923 Personal history of irradiation: Secondary | ICD-10-CM | POA: Diagnosis not present

## 2022-10-18 DIAGNOSIS — T148XXA Other injury of unspecified body region, initial encounter: Secondary | ICD-10-CM | POA: Diagnosis present

## 2022-10-18 DIAGNOSIS — Z952 Presence of prosthetic heart valve: Secondary | ICD-10-CM | POA: Diagnosis not present

## 2022-10-18 DIAGNOSIS — E1140 Type 2 diabetes mellitus with diabetic neuropathy, unspecified: Secondary | ICD-10-CM | POA: Diagnosis present

## 2022-10-18 DIAGNOSIS — I10 Essential (primary) hypertension: Secondary | ICD-10-CM | POA: Diagnosis present

## 2022-10-18 DIAGNOSIS — Z888 Allergy status to other drugs, medicaments and biological substances status: Secondary | ICD-10-CM | POA: Diagnosis not present

## 2022-10-18 DIAGNOSIS — Z79890 Hormone replacement therapy (postmenopausal): Secondary | ICD-10-CM | POA: Diagnosis not present

## 2022-10-18 DIAGNOSIS — D649 Anemia, unspecified: Secondary | ICD-10-CM | POA: Diagnosis present

## 2022-10-18 DIAGNOSIS — E871 Hypo-osmolality and hyponatremia: Secondary | ICD-10-CM | POA: Diagnosis present

## 2022-10-18 DIAGNOSIS — G4733 Obstructive sleep apnea (adult) (pediatric): Secondary | ICD-10-CM | POA: Diagnosis present

## 2022-10-18 DIAGNOSIS — I480 Paroxysmal atrial fibrillation: Secondary | ICD-10-CM | POA: Diagnosis present

## 2022-10-18 DIAGNOSIS — M797 Fibromyalgia: Secondary | ICD-10-CM | POA: Diagnosis present

## 2022-10-18 DIAGNOSIS — T8743 Infection of amputation stump, right lower extremity: Secondary | ICD-10-CM | POA: Diagnosis present

## 2022-10-18 LAB — BASIC METABOLIC PANEL
Anion gap: 10 (ref 5–15)
BUN: 19 mg/dL (ref 8–23)
CO2: 24 mmol/L (ref 22–32)
Calcium: 8.9 mg/dL (ref 8.9–10.3)
Chloride: 100 mmol/L (ref 98–111)
Creatinine, Ser: 1.43 mg/dL — ABNORMAL HIGH (ref 0.61–1.24)
GFR, Estimated: 52 mL/min — ABNORMAL LOW (ref >60)
Glucose, Bld: 93 mg/dL (ref 70–99)
Potassium: 4.8 mmol/L (ref 3.5–5.1)
Sodium: 134 mmol/L — ABNORMAL LOW (ref 135–145)

## 2022-10-18 LAB — CBC
HCT: 34.1 % — ABNORMAL LOW (ref 39.0–52.0)
Hemoglobin: 10.6 g/dL — ABNORMAL LOW (ref 13.0–17.0)
MCH: 29.7 pg (ref 26.0–34.0)
MCHC: 31.1 g/dL (ref 30.0–36.0)
MCV: 95.5 fL (ref 80.0–100.0)
Platelets: 154 10*3/uL (ref 150–400)
RBC: 3.57 MIL/uL — ABNORMAL LOW (ref 4.22–5.81)
RDW: 14.1 % (ref 11.5–15.5)
WBC: 8.9 10*3/uL (ref 4.0–10.5)
nRBC: 0 % (ref 0.0–0.2)

## 2022-10-18 LAB — CULTURE, BLOOD (ROUTINE X 2)

## 2022-10-18 MED ORDER — MORPHINE SULFATE (PF) 2 MG/ML IV SOLN
2.0000 mg | INTRAVENOUS | Status: DC | PRN
  Administered 2022-10-18 – 2022-10-19 (×5): 2 mg via INTRAVENOUS
  Filled 2022-10-18 (×5): qty 1

## 2022-10-18 NOTE — Assessment & Plan Note (Signed)
-   We will continue his antihypertensive therapy. 

## 2022-10-18 NOTE — Assessment & Plan Note (Signed)
-   We will continue Synthroid. 

## 2022-10-18 NOTE — Assessment & Plan Note (Signed)
-   We will continue statin therapy. 

## 2022-10-18 NOTE — Assessment & Plan Note (Signed)
-   This could be early cellulitis of his right BKA stump. - He will be admitted to an observation medical bed. - Will continue antibiotic therapy with IV Rocephin. - Warm compresses will be applied. - Follow-up orthopedic consult to be obtained. - Dr. Lajoyce Corners was notified and is aware about the patient.

## 2022-10-18 NOTE — Progress Notes (Signed)
Patient ID: [name], male   DOB: 07/29/1950, 72 y.o.   MRN: [id number] Examination of patient's right transtibial amputation shows decreased swelling and no cellulitis wound edges are improving.  The dermatitis has improved nicely.  Patient is having some neuropathy pain. Anticipate patient could discharge back to home on oral antibiotics and with his regular pain medicine.  Patient may require an additional day of IV antibiotic.

## 2022-10-18 NOTE — TOC CM/SW Note (Signed)
Patient is active with [facility] for  Skilled Nursing. Please resume care at DC.  If patient remains OBSERVATION status, no new orders needed  Just  ROC.   TOC will continue to follow patient for any additional discharge needs

## 2022-10-18 NOTE — Progress Notes (Signed)
[facility]                                                                              Naftali Carchi, is a 72 y.o. 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  No discharge.  Sutures intact.  No fevers but admitted to chills. Creatinine 1.55.  Mild leukocytosis 11.9.  COVID-negative. CTA chest showed no PE, CT abdomen showed moderate colonic stool splenomegaly and normal appendix, atrophia of right kidney.  Assessment & Plan    Principal Problem:   Cellulitis of right lower extremity stump -Seen this morning by Dr. Lajoyce Corners, already significantly improving with IV antibiotics -Recommended to continue antibiotics today, discharge on oral antibiotics tomorrow and outpatient follow-up in 1 week. -Continue pain control with morphine, gabapentin  Active Problems:   Hypothyroidism -Continue Synthroid    PAF (paroxysmal atrial fibrillation) (HCC) -Continue Zebeta, eliquis    Essential hypertension -Stable, continue amlodipine, Zebeta    GERD without esophagitis -Continue PPI, Carafate     Dyslipidemia -Continue statin  Estimated body mass index is 24.97 kg/m as calculated from the following:   Height as of this encounter: 5\' 11"  (1.803 m).   Weight as of this encounter: 81.2 kg.  Code Status: Full code DVT Prophylaxis:   apixaban (ELIQUIS) tablet 5 mg   Level of Care: Level of care: Med-Surg Family Communication: Updated patient Disposition Plan:      Remains inpatient appropriate: Likely DC home tomorrow   Procedures:     Consultants:   Orthopedics, Dr. Lajoyce Corners  Antimicrobials:   Anti-infectives (From admission, onward)    Start     Dose/Rate Route Frequency Ordered Stop   10/18/22 1930  cefTRIAXone (ROCEPHIN) 2 g in sodium chloride 0.9 % 100 mL IVPB        2 g 200 mL/hr over 30 Minutes Intravenous Every 24 hours 10/17/22 1952 10/25/22 1929   10/18/22 1900  vancomycin (VANCOREADY) IVPB 1250 mg/250 mL        1,250 mg 83.3 mL/hr over 180 Minutes Intravenous Every 24 hours 10/17/22 1929     10/17/22 1930  cefTRIAXone (ROCEPHIN) 1 g in sodium chloride 0.9 % 100 mL IVPB        1 g 200 mL/hr over 30 Minutes Intravenous  Once 10/17/22 1918 10/17/22 2005   10/17/22 1930  vancomycin (VANCOREADY) IVPB 1750 mg/350 mL        1,750 mg 87.5 mL/hr over 240 Minutes Intravenous  Once 10/17/22 1926 10/18/22 0049          Medications  amLODipine  5 mg Oral q AM   apixaban  5 mg Oral BID   bisoprolol  5  mg Oral Daily   cholecalciferol  8,000 Units Oral Daily   dextromethorphan-guaiFENesin  2 tablet Oral BID   empagliflozin  10 mg Oral Daily   gabapentin  600 mg Oral TID   levothyroxine  175 mcg Oral QAC breakfast   mometasone-formoterol  2 puff Inhalation BID   And   umeclidinium bromide  1 puff Inhalation Daily   montelukast  10 mg Oral QHS   multivitamin with minerals  1 tablet Oral Daily   pantoprazole  40 mg Oral QAC breakfast   polyethylene glycol  17 g Oral Daily   potassium chloride SA  20 mEq Oral QODAY   predniSONE  2.5 mg Oral Q breakfast   rOPINIRole  1 mg Oral BID   rosuvastatin  10 mg Oral QHS   senna-docusate  1 tablet Oral BID   spironolactone  25 mg Oral Daily   sucralfate  1 g Oral TID AC   torsemide  15 mg Oral QODAY   traZODone  50 mg Oral QHS      Subjective:   [name] was seen and examined today.  No fevers or chills, had pain in the right stump this morning, although redness improving.  Patient denies dizziness, chest pain, shortness of breath, abdominal pain,  N/V.  Objective:   Vitals:   10/17/22 2110 10/18/22 0402 10/18/22 0708 10/18/22 0908  BP: 132/76 121/67 130/72   Pulse: 74 (!) 59 (!) 59 81  Resp: 16 16 18 16   Temp: 98.1 F (36.7 C) 98.7 F (37.1 C) 98.6 F (37 C)   TempSrc: Oral Oral Oral   SpO2: 99% 100% 98%   Weight:      Height:        Intake/Output Summary (Last 24 hours) at 10/18/2022 1214 Last data filed at 10/18/2022 0459 Gross per 24 hour  Intake 831.89 ml  Output 800 ml  Net 31.89 ml     Wt Readings from Last 3 Encounters:  10/17/22 81.2 kg  10/09/22 81.2 kg  09/04/22 81.6 kg     Exam General: Alert and oriented x 3, NAD Cardiovascular: S1 S2 auscultated,  RRR Respiratory: Clear to auscultation bilaterally, no wheezing, rales or rhonchi Gastrointestinal: Soft, nontender, nondistended, + bowel sounds Ext: right BKA, cellulitis improving, no edema LLE Neuro: no new deficits Psych: Normal affect     Data Reviewed:  I have personally reviewed following labs    CBC Lab Results  Component Value Date   WBC 8.9 10/18/2022   RBC 3.57 (L) 10/18/2022   HGB 10.6 (L) 10/18/2022   HCT 34.1 (L) 10/18/2022   MCV 95.5 10/18/2022   MCH 29.7 10/18/2022   PLT 154 10/18/2022   MCHC 31.1 10/18/2022   RDW 14.1 10/18/2022   LYMPHSABS 0.8 10/17/2022   MONOABS 1.2 (H) 10/17/2022   EOSABS 0.1 10/17/2022   BASOSABS 0.1 10/17/2022     Last metabolic panel Lab Results  Component Value Date   NA 134 (L) 10/18/2022   K 4.8 10/18/2022   CL 100 10/18/2022   CO2 24 10/18/2022   BUN 19 10/18/2022   CREATININE 1.43 (H) 10/18/2022   GLUCOSE 93 10/18/2022   GFRNONAA 52 (L) 10/18/2022   GFRAA 41 (L) 03/29/2020   CALCIUM 8.9 10/18/2022   PHOS 3.1 06/11/2021   PROT 7.2 10/17/2022   ALBUMIN 3.3 (L) 10/17/2022   LABGLOB 3.0 07/03/2021   BILITOT 0.5 10/17/2022   ALKPHOS 76 10/17/2022   AST 18 10/17/2022   ALT 15 10/17/2022  ANIONGAP 10 10/18/2022    CBG (last 3)  No results for input(s): "GLUCAP" in the last  72 hours.    Coagulation Profile: Recent Labs  Lab 10/17/22 0800  INR 1.3*     Radiology Studies: I have personally reviewed the imaging studies  CT ABDOMEN PELVIS W CONTRAST  Result Date: 10/17/2022 CLINICAL DATA:  Sepsis.  Pain.  Recent renal ablation EXAM: CT ABDOMEN AND PELVIS WITH CONTRAST TECHNIQUE: Multidetector CT imaging of the abdomen and pelvis was performed using the standard protocol following bolus administration of intravenous contrast. RADIATION DOSE REDUCTION: This exam was performed according to the departmental dose-optimization program which includes automated exposure control, adjustment of the mA and/or kV according to patient size and/or use of iterative reconstruction technique. CONTRAST:  75mL OMNIPAQUE IOHEXOL 350 MG/ML SOLN COMPARISON:  Noncontrast CT 06/24/2018 FINDINGS: Lower chest: Please see separate CT angiogram of the chest from same day. Hepatobiliary: No space-occupying liver lesion. Patent portal vein. Small stone dependently in the nondilated gallbladder. Pancreas: Moderate atrophy of the pancreas. Spleen: Spleen is enlarged with AP length of 16.5 cm, similar to previous. Adrenals/Urinary Tract: The adrenal glands are preserved. There are areas of severe atrophy along the right kidney. No separate enhancing mass or collecting system dilatation. The right ureter has a normal course and caliber down to the bladder. Bladder is underdistended but has a preserved contour. Left kidney has a focal low-attenuation defect posteriorly in the midportion consistent with previous intervention. The amount of adjacent stranding is similar. Separate tiny Bosniak 2 adjacent cyst and separate lower pole cyst. No specific imaging follow-up of the cystic areas. No collecting system dilatation. The left ureter has a normal course and caliber down to the bladder. Stomach/Bowel: Stomach is collapsed. No oral contrast. Small bowel has a overall normal course and caliber. Normal appendix in  the right lower quadrant. Moderate diffuse colonic stool. Redundant course of the left side of the colon and sigmoid colon. Vascular/Lymphatic: Aortic atherosclerosis. No enlarged abdominal or pelvic lymph nodes. Reproductive: Prostate is unremarkable. Other: Small fat containing inguinal hernias, left-greater-than-right. Small fat containing umbilical hernia. No free air or free fluid. Musculoskeletal: Scattered degenerative changes of the spine and pelvis. IMPRESSION: Moderate colonic stool. No obstruction, free air or free fluid. Normal appendix. Splenomegaly. Severe atrophy of the right kidney. Posttreatment defect on the left, similar to previous. Please see separate dictation of CT angiogram of the chest Electronically Signed   By: Karen Kays M.D.   On: 10/17/2022 11:17   CT Head Wo Contrast  Result Date: 10/17/2022 CLINICAL DATA:  Headache EXAM: CT HEAD WITHOUT CONTRAST TECHNIQUE: Contiguous axial images were obtained from the base of the skull through the vertex without intravenous contrast. RADIATION DOSE REDUCTION: This exam was performed according to the departmental dose-optimization program which includes automated exposure control, adjustment of the mA and/or kV according to patient size and/or use of iterative reconstruction technique. COMPARISON:  05/13/2022 FINDINGS: Brain: No evidence of acute infarction, hemorrhage, mass, mass effect, or midline shift. No hydrocephalus or extra-axial fluid collection. Vascular: No hyperdense vessel. Skull: Negative for fracture or focal lesion. Sinuses/Orbits: No acute finding. Redemonstrated retention cyst or polyp in the right maxillary sinus, which measures up to 1.9 cm, previously 1.4 cm on 06/09/2021 and 1.1 cm on 10/17/2019. Other: The mastoid air cells are well aerated. IMPRESSION: 1. No acute intracranial process. 2. Redemonstrated retention cyst or polyp in the right maxillary sinus, which measures up to 1.9 cm, previously 1.1 cm on 10/17/2019. This  is most likely benign, but consider non emergent ENT referral given interval growth. Electronically Signed   By: Wiliam Ke M.D.   On: 10/17/2022 11:16   CT Angio Chest PE W and/or Wo Contrast  Result Date: 10/17/2022 CLINICAL DATA:  Chest tightness. Postop pain recent renal cell carcinoma ablation. EXAM: CT ANGIOGRAPHY CHEST WITH CONTRAST TECHNIQUE: Multidetector CT imaging of the chest was performed using the standard protocol during bolus administration of intravenous contrast. Multiplanar CT image reconstructions and MIPs were obtained to evaluate the vascular anatomy. RADIATION DOSE REDUCTION: This exam was performed according to the departmental dose-optimization program which includes automated exposure control, adjustment of the mA and/or kV according to patient size and/or use of iterative reconstruction technique. CONTRAST:  75mL OMNIPAQUE IOHEXOL 350 MG/ML SOLN COMPARISON:  Chest x-ray earlier 10/17/2022. CT angiogram chest 06/20/2022 FINDINGS: Cardiovascular: There is significant breathing motion identified throughout the examination. This limits evaluation, nondiagnostic for small and peripheral emboli. No segmental or larger embolus identified. Heart is borderline in size. No significant pericardial effusion. There is a battery pack along the left upper chest with leads extending along the right side of the heart. Status post median sternotomy with prosthetic aortic valve. The thoracic aorta has scattered vascular calcifications. Contrast is predominantly in the pulmonary arterial tree and very little along the aorta. Mediastinum/Nodes: Small thyroid gland. No specific abnormal lymph node enlargement identified in the axillary region or hilum. There are some small mediastinal nodes seen diffusely, nonpathologic by size criteria. Slightly more numerous than usually seen but unchanged from the previous examination. Normal caliber thoracic esophagus. Lungs/Pleura: Extensive breathing motion. There  are some centrilobular emphysematous changes identified. No consolidation, pneumothorax or effusion. Noncalcified right upper lobe nodule identified on series 5, image 46 measuring 4 mm. Unchanged from the previous exam. This is stable going back to a study of June 2021 demonstrating long-term stability. No additional imaging follow-up. Calcified nodule left lower lobe on series 5, image 89 is also seen consistent with old granulomatous disease and also stable. Upper Abdomen: Adrenal glands are preserved in the upper abdomen. Please see separate dictation of abdomen and pelvis CT from same day Musculoskeletal: Scattered degenerative changes along the spine. Old healed right-sided rib fracture. Review of the MIP images confirms the above findings. IMPRESSION: Extensive breathing motion.  No large or central embolus. Postop chest.  Pacemaker.  Prosthetic aortic valve Aortic Atherosclerosis (ICD10-I70.0) and Emphysema (ICD10-J43.9). Electronically Signed   By: Karen Kays M.D.   On: 10/17/2022 11:11   DG Tibia/Fibula Right  Result Date: 10/17/2022 CLINICAL DATA:  Bleeding from new amputation. EXAM: RIGHT TIBIA AND FIBULA - 2 VIEW COMPARISON:  Right tibia/fibula radiographs 05/29/2020 FINDINGS: The patient is status post below-knee amputation. There is no cortical irregularity or destruction along the amputation margins. There is no acute fracture. Knee alignment is normal. The joint spaces are preserved. The soft tissues are unremarkable. There is no suprapatellar effusion. There is no soft tissue gas or radiopaque foreign body. IMPRESSION: 1. Status post below-knee amputation. No cortical irregularity or destruction along the amputation margins. 2. No acute finding. Electronically Signed   By: Lesia Hausen M.D.   On: 10/17/2022 08:21   DG Chest Port 1 View  Result Date: 10/17/2022 CLINICAL DATA:  Chest tightness, postoperative pain. EXAM: PORTABLE CHEST 1 VIEW COMPARISON:  06/20/2022. FINDINGS: Left chest  dual-chamber pacemaker with leads projecting over the right atrium and ventricle. Low lung volumes accentuate the pulmonary vasculature and cardiomediastinal silhouette. No consolidation or pulmonary edema.  Stable postoperative changes of median sternotomy, aortic valve replacement and left atrial appendage clipping. No pleural effusion or pneumothorax. Visualized bones and upper abdomen are IMPRESSION: Low lung volumes without evidence of acute cardiopulmonary disease. Electronically Signed   By: Orvan Falconer M.D.   On: 10/17/2022 08:19       Billiejo Sorto M.D. [facility] 10/18/2022, 12:14 PM  Available via Epic secure chat 7am-7pm After 7 pm, please refer to night coverage provider listed on amion.

## 2022-10-18 NOTE — Assessment & Plan Note (Signed)
-   Continue Eliquis and Zebeta

## 2022-10-18 NOTE — Assessment & Plan Note (Signed)
-   We will continue PPI therapy as well as Carafate. 

## 2022-10-19 DIAGNOSIS — R52 Pain, unspecified: Secondary | ICD-10-CM | POA: Diagnosis not present

## 2022-10-19 DIAGNOSIS — T148XXA Other injury of unspecified body region, initial encounter: Secondary | ICD-10-CM | POA: Diagnosis not present

## 2022-10-19 DIAGNOSIS — L03115 Cellulitis of right lower limb: Secondary | ICD-10-CM | POA: Diagnosis not present

## 2022-10-19 DIAGNOSIS — L089 Local infection of the skin and subcutaneous tissue, unspecified: Secondary | ICD-10-CM | POA: Diagnosis not present

## 2022-10-19 LAB — CULTURE, BLOOD (ROUTINE X 2): Special Requests: ADEQUATE

## 2022-10-19 MED ORDER — GABAPENTIN 300 MG PO CAPS
600.0000 mg | ORAL_CAPSULE | Freq: Three times a day (TID) | ORAL | 3 refills | Status: AC
Start: 2022-10-19 — End: ?

## 2022-10-19 MED ORDER — CEPHALEXIN 500 MG PO CAPS: 500.0000 mg | ORAL_CAPSULE | Freq: Four times a day (QID) | ORAL | 0 refills | Status: DC

## 2022-10-19 MED ORDER — ONDANSETRON HCL 4 MG PO TABS: 4.0000 mg | ORAL_TABLET | Freq: Three times a day (TID) | ORAL | 0 refills | Status: DC | PRN

## 2022-10-19 MED ORDER — CEPHALEXIN 500 MG PO CAPS
500.0000 mg | ORAL_CAPSULE | Freq: Four times a day (QID) | ORAL | Status: DC
  Administered 2022-10-19: 500 mg via ORAL
  Filled 2022-10-19: qty 1

## 2022-10-19 NOTE — Discharge Summary (Signed)
Physician Discharge Summary   Patient: [name] MRN: [id number] DOB: May 03, 1950  Admit date:     10/17/2022  Discharge date: 10/19/22  Discharge Physician: Thad Ranger, MD    PCP: Alysia Penna, MD   Recommendations at discharge:   Continue Keflex 500 mg p.o. 4 times daily for 10 days Outpatient follow-up with Dr. 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  No discharge.  Sutures intact.  No fevers but admitted to chills. Creatinine 1.55.  Mild leukocytosis 11.9.  COVID-negative. CTA chest showed no PE, CT abdomen showed moderate colonic stool splenomegaly and normal appendix, atrophia of right kidney.    Assessment and Plan:     Cellulitis of right lower extremity stump -Evaluated by Dr. Lajoyce Corners, already significantly improving with IV antibiotics -Patient was continued on IV antibiotics, IV vancomycin and IV Rocephin for 2 days, transition to oral Keflex 500 mg p.o. 4 times daily for 10 days.  He will follow-up with Dr. Lajoyce Corners next week. -Continue pain control.    Hypothyroidism -Continue Synthroid     PAF (paroxysmal atrial fibrillation) (HCC) -Continue Zebeta, eliquis     Essential hypertension -Stable, continue amlodipine, Zebeta     GERD without esophagitis -Continue PPI, Carafate       Dyslipidemia -Continue statin   Estimated body mass index is 24.97 kg/m as calculated from the following:   Height as of this encounter: 5\' 11"  (1.803 m).   Weight as of this encounter: 81.2 kg.     Pain control - [employer]  Controlled Substance Reporting System database was reviewed. and patient was instructed, not to drive, operate heavy machinery, perform activities at heights, swimming or participation in water activities or provide baby-sitting services while on Pain, Sleep and Anxiety Medications; until their outpatient Physician has advised to do so again. Also recommended to not to take more than prescribed Pain, Sleep and Anxiety Medications.  Consultants: Orthopedics, Dr. Lajoyce Corners Procedures performed: None Disposition: Home Diet recommendation:  Discharge Diet Orders (From admission, onward)     Start     Ordered   10/19/22 0000  Diet - low sodium heart healthy        10/19/22 1036            DISCHARGE MEDICATION: Allergies as of 10/19/2022       Reactions   Meloxicam Rash, Other (See Comments)   Red Man Syndrome, also   Vancomycin Other (See Comments)   Red Man's syndrome 09/02/15, resolved with diphenhydramine and slowing of rate        Medication List     STOP taking these medications    doxycycline 100 MG tablet Commonly known as: VIBRA-TABS   sulfamethoxazole-trimethoprim 800-160 MG tablet Commonly known as: BACTRIM DS       TAKE these medications    albuterol 108 (90 Base) MCG/ACT inhaler Commonly known as: VENTOLIN HFA Inhale 2 puffs into the lungs every 6 (six) hours as needed for wheezing or shortness of breath.   amLODipine 5 MG tablet Commonly known as: NORVASC Take 5 mg by mouth  in the morning.   apixaban 5 MG Tabs tablet Commonly known as: ELIQUIS Take 1 tablet (5 mg total) by mouth 2 (two) times daily.   bisoprolol 5 MG tablet Commonly known as: ZEBETA Take 5 mg by mouth daily.   Breztri Aerosphere 160-9-4.8 MCG/ACT Aero Generic drug: Budeson-Glycopyrrol-Formoterol Inhale 2 puffs into the lungs in the morning and at bedtime.   Cal-Mag-Zinc-D Tabs Take 1 tablet by mouth daily.   cephALEXin 500 MG capsule Commonly known as: KEFLEX Take 1 capsule (500  mg total) by mouth 4 (four) times daily for 10 days. What changed: when to take this   empagliflozin 10 MG Tabs tablet Commonly known as: Jardiance Take 1 tablet (10 mg total) by mouth daily.   gabapentin 300 MG capsule Commonly known as: NEURONTIN Take 2 capsules (600 mg total) by mouth 3 (three) times daily. for neuropathy   levothyroxine 175 MCG tablet Commonly known as: SYNTHROID Take 175 mcg by mouth daily before breakfast.   montelukast 10 MG tablet Commonly known as: SINGULAIR TAKE 1 TABLET(10 MG) BY MOUTH AT BEDTIME What changed: See the new instructions.   Mucinex DM Maximum Strength 60-1200 MG Tb12 Take 1 tablet by mouth daily.   multivitamin with minerals Tabs tablet Take 1 tablet by mouth daily.   ondansetron 4 MG tablet Commonly known as: Zofran Take 1 tablet (4 mg total) by mouth every 8 (eight) hours as needed for nausea or vomiting.   oxyCODONE-acetaminophen 5-325 MG tablet Commonly known as: PERCOCET/ROXICET Take 1 tablet by mouth every 4 (four) hours as needed. What changed: reasons to take this   OXYGEN Inhale 2 L/min into the lungs See admin instructions. 2 L/min at bedtime and 2 L/min throughout the day as needed for shortness of breath   pantoprazole 40 MG tablet Commonly known as: PROTONIX Take 40 mg by mouth daily before breakfast.   polyethylene glycol powder 17 GM/SCOOP powder Commonly known as: GLYCOLAX/MIRALAX Take 17 g by mouth daily as needed for mild constipation. What changed: when to take this   potassium chloride SA 20 MEQ tablet Commonly known as: KLOR-CON M Take 20 mEq by mouth See admin instructions. Take 20 meq twice daily every other day   predniSONE 5 MG tablet Commonly known as: DELTASONE Take 0.5 tablets (2.5 mg total) by mouth daily with breakfast.   rOPINIRole 1 MG tablet Commonly known as: REQUIP Take 1 tablet (1 mg total) by mouth in the morning and at bedtime.   rosuvastatin 10 MG tablet Commonly known as:  CRESTOR Take 10 mg by mouth at bedtime.   senna-docusate 8.6-50 MG tablet Commonly known as: Senokot-S Take 1 tablet by mouth 2 (two) times daily.   spironolactone 25 MG tablet Commonly known as: ALDACTONE Take 1 tablet (25 mg total) by mouth daily.   sucralfate 1 g tablet Commonly known as: CARAFATE Take 1 g by mouth 3 (three) times daily as needed (Acid reflux).   Systane Ultra PF 0.4-0.3 % Soln Generic drug: Polyethyl Glyc-Propyl Glyc PF Place 1 drop into both eyes 2 (two) times daily as needed (dry eyes).   TIGER BALM ARTHRITIS RUB EX Apply 1 application topically as needed (for leg pain).   torsemide 10 MG tablet Commonly known as: DEMADEX Take 15 mg by mouth every other day.   traZODone 50 MG tablet Commonly known as: DESYREL Take 50 mg by mouth at bedtime.   Vitamin D 50 MCG (2000 UT) tablet Take 8,000 Units by mouth daily.  Follow-up Information     Alysia Penna, MD. Schedule an appointment as soon as possible for a visit in 2 week(s).   Specialty: Internal Medicine Why: for hospital follow-up Contact information: [address] [place] [address] [phone]         [name], MD. Schedule an appointment as soon as possible for a visit in 1 week(s).   Specialty: Orthopedic Surgery Why: for hospital follow-up Contact information: [address] [place] [address] [phone]                Discharge Exam: Ceasar Mons Weights   10/17/22 0808  Weight: 81.2 kg   S: Feeling better, still has some pain in the right stump otherwise cellulitis is improving.  No fevers or chills.  BP 138/61 (BP Location: Left Arm)   Pulse (!) 59   Temp 98.6 F (37 C) (Oral)   Resp 16   Ht 5\' 11"  (1.803 m)   Wt 81.2 kg   SpO2 97%   BMI 24.97 kg/m   Physical Exam General: Alert and oriented x 3, NAD Cardiovascular: S1 S2 clear, RRR.  Respiratory: CTAB, no wheezing, rales or rhonchi Gastrointestinal: Soft, nontender, nondistended,  NBS Ext: right BKA, cellulitis much improved, intact sutures Psych: Normal affect   Condition at discharge: fair  The results of significant diagnostics from this hospitalization (including imaging, microbiology, ancillary and laboratory) are listed below for reference.   Imaging Studies: CT ABDOMEN PELVIS W CONTRAST  Result Date: 10/17/2022 CLINICAL DATA:  Sepsis.  Pain.  Recent renal ablation EXAM: CT ABDOMEN AND PELVIS WITH CONTRAST TECHNIQUE: Multidetector CT imaging of the abdomen and pelvis was performed using the standard protocol following bolus administration of intravenous contrast. RADIATION DOSE REDUCTION: This exam was performed according to the departmental dose-optimization program which includes automated exposure control, adjustment of the mA and/or kV according to patient size and/or use of iterative reconstruction technique. CONTRAST:  75mL OMNIPAQUE IOHEXOL 350 MG/ML SOLN COMPARISON:  Noncontrast CT 06/24/2018 FINDINGS: Lower chest: Please see separate CT angiogram of the chest from same day. Hepatobiliary: No space-occupying liver lesion. Patent portal vein. Small stone dependently in the nondilated gallbladder. Pancreas: Moderate atrophy of the pancreas. Spleen: Spleen is enlarged with AP length of 16.5 cm, similar to previous. Adrenals/Urinary Tract: The adrenal glands are preserved. There are areas of severe atrophy along the right kidney. No separate enhancing mass or collecting system dilatation. The right ureter has a normal course and caliber down to the bladder. Bladder is underdistended but has a preserved contour. Left kidney has a focal low-attenuation defect posteriorly in the midportion consistent with previous intervention. The amount of adjacent stranding is similar. Separate tiny Bosniak 2 adjacent cyst and separate lower pole cyst. No specific imaging follow-up of the cystic areas. No collecting system dilatation. The left ureter has a normal course and caliber down  to the bladder. Stomach/Bowel: Stomach is collapsed. No oral contrast. Small bowel has a overall normal course and caliber. Normal appendix in the right lower quadrant. Moderate diffuse colonic stool. Redundant course of the left side of the colon and sigmoid colon. Vascular/Lymphatic: Aortic atherosclerosis. No enlarged abdominal or pelvic lymph nodes. Reproductive: Prostate is unremarkable. Other: Small fat containing inguinal hernias, left-greater-than-right. Small fat containing umbilical hernia. No free air or free fluid. Musculoskeletal: Scattered degenerative changes of the spine and pelvis. IMPRESSION: Moderate colonic stool. No obstruction, free air or free fluid. Normal appendix. Splenomegaly. Severe atrophy of the right kidney. Posttreatment defect on the left,  similar to previous. Please see separate dictation of CT angiogram of the chest Electronically Signed   By: Karen Kays M.D.   On: 10/17/2022 11:17   CT Head Wo Contrast  Result Date: 10/17/2022 CLINICAL DATA:  Headache EXAM: CT HEAD WITHOUT CONTRAST TECHNIQUE: Contiguous axial images were obtained from the base of the skull through the vertex without intravenous contrast. RADIATION DOSE REDUCTION: This exam was performed according to the departmental dose-optimization program which includes automated exposure control, adjustment of the mA and/or kV according to patient size and/or use of iterative reconstruction technique. COMPARISON:  05/13/2022 FINDINGS: Brain: No evidence of acute infarction, hemorrhage, mass, mass effect, or midline shift. No hydrocephalus or extra-axial fluid collection. Vascular: No hyperdense vessel. Skull: Negative for fracture or focal lesion. Sinuses/Orbits: No acute finding. Redemonstrated retention cyst or polyp in the right maxillary sinus, which measures up to 1.9 cm, previously 1.4 cm on 06/09/2021 and 1.1 cm on 10/17/2019. Other: The mastoid air cells are well aerated. IMPRESSION: 1. No acute intracranial  process. 2. Redemonstrated retention cyst or polyp in the right maxillary sinus, which measures up to 1.9 cm, previously 1.1 cm on 10/17/2019. This is most likely benign, but consider non emergent ENT referral given interval growth. Electronically Signed   By: Wiliam Ke M.D.   On: 10/17/2022 11:16   CT Angio Chest PE W and/or Wo Contrast  Result Date: 10/17/2022 CLINICAL DATA:  Chest tightness. Postop pain recent renal cell carcinoma ablation. EXAM: CT ANGIOGRAPHY CHEST WITH CONTRAST TECHNIQUE: Multidetector CT imaging of the chest was performed using the standard protocol during bolus administration of intravenous contrast. Multiplanar CT image reconstructions and MIPs were obtained to evaluate the vascular anatomy. RADIATION DOSE REDUCTION: This exam was performed according to the departmental dose-optimization program which includes automated exposure control, adjustment of the mA and/or kV according to patient size and/or use of iterative reconstruction technique. CONTRAST:  75mL OMNIPAQUE IOHEXOL 350 MG/ML SOLN COMPARISON:  Chest x-ray earlier 10/17/2022. CT angiogram chest 06/20/2022 FINDINGS: Cardiovascular: There is significant breathing motion identified throughout the examination. This limits evaluation, nondiagnostic for small and peripheral emboli. No segmental or larger embolus identified. Heart is borderline in size. No significant pericardial effusion. There is a battery pack along the left upper chest with leads extending along the right side of the heart. Status post median sternotomy with prosthetic aortic valve. The thoracic aorta has scattered vascular calcifications. Contrast is predominantly in the pulmonary arterial tree and very little along the aorta. Mediastinum/Nodes: Small thyroid gland. No specific abnormal lymph node enlargement identified in the axillary region or hilum. There are some small mediastinal nodes seen diffusely, nonpathologic by size criteria. Slightly more  numerous than usually seen but unchanged from the previous examination. Normal caliber thoracic esophagus. Lungs/Pleura: Extensive breathing motion. There are some centrilobular emphysematous changes identified. No consolidation, pneumothorax or effusion. Noncalcified right upper lobe nodule identified on series 5, image 46 measuring 4 mm. Unchanged from the previous exam. This is stable going back to a study of June 2021 demonstrating long-term stability. No additional imaging follow-up. Calcified nodule left lower lobe on series 5, image 89 is also seen consistent with old granulomatous disease and also stable. Upper Abdomen: Adrenal glands are preserved in the upper abdomen. Please see separate dictation of abdomen and pelvis CT from same day Musculoskeletal: Scattered degenerative changes along the spine. Old healed right-sided rib fracture. Review of the MIP images confirms the above findings. IMPRESSION: Extensive breathing motion.  No large or central embolus.  Postop chest.  Pacemaker.  Prosthetic aortic valve Aortic Atherosclerosis (ICD10-I70.0) and Emphysema (ICD10-J43.9). Electronically Signed   By: Karen Kays M.D.   On: 10/17/2022 11:11   DG Tibia/Fibula Right  Result Date: 10/17/2022 CLINICAL DATA:  Bleeding from new amputation. EXAM: RIGHT TIBIA AND FIBULA - 2 VIEW COMPARISON:  Right tibia/fibula radiographs 05/29/2020 FINDINGS: The patient is status post below-knee amputation. There is no cortical irregularity or destruction along the amputation margins. There is no acute fracture. Knee alignment is normal. The joint spaces are preserved. The soft tissues are unremarkable. There is no suprapatellar effusion. There is no soft tissue gas or radiopaque foreign body. IMPRESSION: 1. Status post below-knee amputation. No cortical irregularity or destruction along the amputation margins. 2. No acute finding. Electronically Signed   By: Lesia Hausen M.D.   On: 10/17/2022 08:21   DG Chest Port 1  View  Result Date: 10/17/2022 CLINICAL DATA:  Chest tightness, postoperative pain. EXAM: PORTABLE CHEST 1 VIEW COMPARISON:  06/20/2022. FINDINGS: Left chest dual-chamber pacemaker with leads projecting over the right atrium and ventricle. Low lung volumes accentuate the pulmonary vasculature and cardiomediastinal silhouette. No consolidation or pulmonary edema. Stable postoperative changes of median sternotomy, aortic valve replacement and left atrial appendage clipping. No pleural effusion or pneumothorax. Visualized bones and upper abdomen are IMPRESSION: Low lung volumes without evidence of acute cardiopulmonary disease. Electronically Signed   By: Orvan Falconer M.D.   On: 10/17/2022 08:19    Microbiology: Results for orders placed or performed during the hospital encounter of 10/17/22  Blood Culture (routine x 2)     Status: None (Preliminary result)   Collection Time: 10/17/22  7:45 AM   Specimen: BLOOD LEFT HAND  Result Value Ref Range Status   Specimen Description BLOOD LEFT HAND SITE NOT SPECIFIED  Final   Special Requests   Final    BOTTLES DRAWN AEROBIC AND ANAEROBIC Blood Culture results may not be optimal due to an excessive volume of blood received in culture bottles   Culture   Final    NO GROWTH 2 DAYS Performed at [facility], [address], [place], [place] [address]    Report Status PENDING  Incomplete  Blood Culture (routine x 2)     Status: None (Preliminary result)   Collection Time: 10/17/22  8:00 AM   Specimen: BLOOD  Result Value Ref Range Status   Specimen Description BLOOD SITE NOT SPECIFIED  Final   Special Requests   Final    BOTTLES DRAWN AEROBIC AND ANAEROBIC Blood Culture adequate volume   Culture   Final    NO GROWTH 2 DAYS Performed at [facility], [address], [place], [place] [address]    Report Status PENDING  Incomplete  SARS Coronavirus 2 by RT PCR (hospital order, performed in [facility] hospital lab) *cepheid single result  test* Anterior Nasal Swab     Status: None   Collection Time: 10/17/22  9:01 AM   Specimen: Anterior Nasal Swab  Result Value Ref Range Status   SARS Coronavirus 2 by RT PCR NEGATIVE NEGATIVE Final    Comment: Performed at [facility], [address], [place], [place] [address]   *Note: Due to a large number of results and/or encounters for the requested time period, some results have not been displayed. A complete set of results can be found in Results Review.    Labs: CBC: Recent Labs  Lab 10/17/22 0800 10/18/22 0125  WBC 11.9*  8.9  NEUTROABS 9.7*  --   HGB 10.6* 10.6*  HCT 33.9* 34.1*  MCV 92.9 95.5  PLT 193 154   Basic Metabolic Panel: Recent Labs  Lab 10/17/22 0800 10/18/22 0125  NA 133* 134*  K 4.5 4.8  CL 101 100  CO2 22 24  GLUCOSE 104* 93  BUN 22 19  CREATININE 1.55* 1.43*  CALCIUM 9.2 8.9   Liver Function Tests: Recent Labs  Lab 10/17/22 0800  AST 18  ALT 15  ALKPHOS 76  BILITOT 0.5  PROT 7.2  ALBUMIN 3.3*   CBG: No results for input(s): "GLUCAP" in the last 168 hours.  Discharge time spent: greater than 30 minutes.  Signed: Thad Ranger, MD [facility] 10/19/2022

## 2022-10-20 LAB — CULTURE, BLOOD (ROUTINE X 2)
Culture: NO GROWTH
Culture: NO GROWTH

## 2022-10-21 ENCOUNTER — Encounter: Payer: Self-pay | Admitting: Orthopedic Surgery

## 2022-10-21 ENCOUNTER — Telehealth: Payer: Self-pay | Admitting: Orthopedic Surgery

## 2022-10-21 ENCOUNTER — Encounter: Payer: Medicare Other | Admitting: Orthopedic Surgery

## 2022-10-21 NOTE — Telephone Encounter (Signed)
Pt went to ED on 10/17/22, kept until 10/19/22 for wound infection. Was discharged with keflex. Stating breaking him out. He was also called last week due to cultures and was put on bactrim ds and told to stop doxy. Please advise on keflex.

## 2022-10-21 NOTE — Progress Notes (Signed)
Office Visit Note   Patient: [name]           Date of Birth: Mar 01, 1951           MRN: [id number] Visit Date: 10/07/2022              Requested by: Alysia Penna, MD [address] [place],  [place] [address] PCP: Alysia Penna, MD  Chief Complaint  Patient presents with   Right Leg - Routine Post Op    09/04/2022 right BKA      HPI: Patient is a 72 year old gentleman who is 4 weeks status post right transtibial amputation.  Patient's home health nurse reports fever and wound dehiscence.  Patient states he is having increasing wound pain  Assessment & Plan: Visit Diagnoses:  1. Right below-knee amputee [facility])     Plan: Will plan for revision transtibial amputation. . Follow-Up Instructions: Return in about 2 weeks (around 10/21/2022).   Ortho Exam  Patient is alert, oriented, no adenopathy, well-dressed, normal affect, normal respiratory effort. Examination there is dehiscence of the transtibial amputation.  There is no cellulitis there is nonviable tissue in the wound bed.  Imaging: No results found.   Labs: Lab Results  Component Value Date   HGBA1C 6.5 (H) 12/27/2021   HGBA1C 6.6 (H) 06/11/2021   HGBA1C 6.7 (H) 02/02/2021   ESRSEDRATE 60 (H) 09/04/2015   REPTSTATUS PENDING 10/17/2022   GRAMSTAIN NO WBC SEEN NO ORGANISMS SEEN  10/09/2022   CULT  10/17/2022    NO GROWTH 4 DAYS Performed at [facility], [address], [place], [place] [address]    [facility] STAPHYLOCOCCUS AUREUS 10/09/2022   LABORGA STAPHYLOCOCCUS EPIDERMIDIS 10/09/2022     Lab Results  Component Value Date   ALBUMIN 3.3 (L) 10/17/2022   ALBUMIN 3.6 09/04/2022   ALBUMIN 4.2 06/20/2022   PREALBUMIN 25 09/04/2022    Lab Results  Component Value Date   MG 2.0 06/11/2021   MG 2.3 12/10/2020   MG 2.4 05/29/2020   No results found for: "VD25OH"  Lab Results  Component Value Date   PREALBUMIN 25 09/04/2022      Latest Ref Rng & Units 10/18/2022    1:25 AM  10/17/2022    8:00 AM 10/09/2022    9:17 AM  CBC EXTENDED  WBC 4.0 - 10.5 K/uL 8.9  11.9  8.9   RBC 4.22 - 5.81 MIL/uL 3.57  3.65  4.89   Hemoglobin 13.0 - 17.0 g/dL 82.9  56.2  13.0   HCT 39.0 - 52.0 % 34.1  33.9  46.2   Platelets 150 - 400 K/uL 154  193  165   NEUT# 1.7 - 7.7 K/uL  9.7    Lymph# 0.7 - 4.0 K/uL  0.8       There is no height or weight on file to calculate BMI.  Orders:  No orders of the defined types were placed in this encounter.  Meds ordered this encounter  Medications   DISCONTD: doxycycline (VIBRA-TABS) 100 MG tablet    Sig: Take 1 tablet (100 mg total) by mouth 2 (two) times daily.    Dispense:  60 tablet    Refill:  0   DISCONTD: oxyCODONE-acetaminophen (PERCOCET/ROXICET) 5-325 MG tablet    Sig: Take 1 tablet by mouth every 4 (four) hours as needed.    Dispense:  30 tablet    Refill:  0     Procedures: No procedures performed  Clinical Data: No additional findings.  ROS:  All other systems negative, except as noted in the HPI. Review of Systems  Objective: Vital Signs: There were no vitals taken for this visit.  Specialty Comments:  No specialty comments available.  PMFS History: Patient Active Problem List   Diagnosis Date Noted   Dyslipidemia 10/18/2022   Cellulitis of right lower extremity 10/17/2022   Dehiscence of amputation stump of right lower extremity (HCC) 10/09/2022   Below-knee amputation of right lower extremity (HCC) 10/09/2022   Gangrene of right foot (HCC) 09/04/2022   S/P BKA (below knee amputation), right (HCC) 09/04/2022   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) 08/23/2022   CAP (community acquired pneumonia) 12/28/2021   Constipation 12/28/2021   Pain in both lower extremities 07/03/2021   Peripheral neuropathy 07/03/2021   Acute metabolic encephalopathy 06/09/2021   Marijuana dependence (HCC) 06/09/2021   DNR (do not resuscitate) 06/09/2021   Chest pain 06/08/2021   Renal lesion 02/14/2021   Status  post cryoablation 02/14/2021   Allergic rhinitis 12/08/2020   Preop pulmonary/respiratory exam 03/27/2020   Acute on chronic diastolic (congestive) heart failure (HCC) 01/22/2020   Abdominal pain 01/22/2020   Hypothyroidism 12/13/2019   DM2 (diabetes mellitus, type 2) (HCC) 12/13/2019   Chronic kidney disease, stage 3b (HCC) 12/13/2019   Acute bacterial bronchitis 12/13/2019   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   Typical atrial flutter (HCC)    Chronic respiratory failure with hypoxia (HCC) 05/23/2016   CAD (coronary artery disease) 05/23/2016   Persistent atrial fibrillation (HCC)    Atypical atrial flutter (HCC)    Visit for monitoring Tikosyn therapy 04/16/2016   Chronic diastolic CHF (congestive heart failure) (HCC) 02/03/2016   Cough 12/06/2015   Acute on chronic respiratory failure with hypoxia (HCC) 09/08/2015   Hallucinations 09/04/2015   Lymphadenopathy 09/04/2015   Ascending aortic aneurysm (HCC) 09/04/2015   Hypoxia 09/02/2015   S/P aortic valve replacement with bioprosthetic valve 09/02/2015   Pleural effusion 09/02/2015   S/P AVR 08/22/2015   GERD without esophagitis 09/20/2014   Intrinsic asthma 10/13/2013   Dyspnea 09/15/2013   Pulmonary hypertension (HCC) 09/15/2013   Shortness of breath 05/05/2013   Pacemaker-St.Jude 04/01/2012   Bradycardia 03/31/2012   Second degree AV block 03/31/2012   AV block, 1st degree 02/01/2012   PAF (paroxysmal atrial fibrillation) (HCC) 11/13/2011   Fatigue 11/13/2011   Essential hypertension 11/13/2011   Aortic stenosis 11/13/2011   Sleep apnea 11/13/2011   Past Medical History:  Diagnosis Date   Aortic stenosis, mild    Arthritis of hand    "just a little bit in both hands" (03/31/2012)   Asthma    "little bit" (03/31/2012)   Atrial fibrillation (HCC)    dx '04; DCCV '04, placed on flecainide, failed DCCV 04/2010, flecainide stopped; s/p successful A.fib ablation 01/31/12   CHF (congestive heart failure) (HCC)    Controlled  type 2 diabetes mellitus without complication, without long-term current use of insulin (HCC) 12/13/2019   COPD (chronic obstructive pulmonary disease) (HCC)    DJD (degenerative joint disease)    Fatty liver    Fibromyalgia    GERD (gastroesophageal reflux disease)    Hyperlipidemia    Hypertension    Hypothyroidism    S/P radiation   Left atrial enlargement    LA size 45mm by echo 11/21/11   Mitral regurgitation    trivial   Obstructive sleep apnea    mild by sleep study 2013; pt stated he does not have a machine because "it  wasn't bad enough for him to have one"   Pacemaker 03/31/2012   Pneumonia    last time 2023   Restless leg syndrome    Second degree AV block    Splenomegaly     Family History  Problem Relation Age of Onset   Heart failure Mother    Stroke Father    Neuropathy Neg Hx     Past Surgical History:  Procedure Laterality Date   AMPUTATION Right 08/23/2022   Procedure: RIGHT FOOT FIFTH RAY AMPUTATION;  Surgeon: [name], MD;  Location: [facility] OR;  Service: Orthopedics;  Laterality: Right;   AMPUTATION Right 09/04/2022   Procedure: RIGHT BELOW KNEE AMPUTATION;  Surgeon: [name], MD;  Location: [facility] OR;  Service: Orthopedics;  Laterality: Right;   AORTIC VALVE REPLACEMENT N/A 08/22/2015   Procedure: AORTIC VALVE REPLACEMENT (AVR);  Surgeon: Kerin Perna, MD;  Location: [facility] OR;  Service: Open Heart Surgery;  Laterality: N/A;   ATRIAL FIBRILLATION ABLATION  01/30/2012   PVI by Dr. Johney Frame   ATRIAL FIBRILLATION ABLATION N/A 01/31/2012   Procedure: ATRIAL FIBRILLATION ABLATION;  Surgeon: [name], MD;  Location: [facility] CATH LAB;  Service: Cardiovascular;  Laterality: N/A;   ATRIAL FIBRILLATION ABLATION N/A 04/19/2020   Procedure: ATRIAL FIBRILLATION ABLATION;  Surgeon: [name], MD;  Location: [facility] INVASIVE CV LAB;  Service: Cardiovascular;  Laterality: N/A;   BACK SURGERY     X 3   BIOPSY  01/01/2022   Procedure: BIOPSY;  Surgeon: Kathi Der, MD;   Location: [facility] ENDOSCOPY;  Service: Gastroenterology;;   CARDIAC CATHETERIZATION N/A 08/09/2015   Procedure: Right/Left Heart Cath and Coronary Angiography;  Surgeon: Peter M Swaziland, MD;  Location: [facility] INVASIVE CV LAB;  Service: Cardiovascular;  Laterality: N/A;   CARDIAC CATHETERIZATION N/A 02/05/2016   Procedure: Right/Left Heart Cath and Coronary Angiography;  Surgeon: Corky Crafts, MD;  Location: [facility] INVASIVE CV LAB;  Service: Cardiovascular;  Laterality: N/A;   CARDIAC CATHETERIZATION N/A 04/17/2016   Procedure: Right Heart Cath;  Surgeon: Dolores Patty, MD;  Location: [facility] INVASIVE CV LAB;  Service: Cardiovascular;  Laterality: N/A;   CARDIOVASCULAR STRESS TEST  03/12/2002   EF 48%, NO EVIDENCE OF ISCHEMIA   CARDIOVERSION  01/2012; 03/31/2012   CARDIOVERSION N/A 02/25/2012   Procedure: CARDIOVERSION;  Surgeon: [name], MD;  Location: [facility] CATH LAB;  Service: Cardiovascular;  Laterality: N/A;   CARDIOVERSION N/A 03/31/2012   Procedure: CARDIOVERSION;  Surgeon: [name], MD;  Location: [facility] CATH LAB;  Service: Cardiovascular;  Laterality: N/A;   CARDIOVERSION N/A 11/23/2015   Procedure: CARDIOVERSION;  Surgeon: Laurey Morale, MD;  Location: [facility] ENDOSCOPY;  Service: Cardiovascular;  Laterality: N/A;   CARDIOVERSION N/A 04/08/2016   Procedure: CARDIOVERSION;  Surgeon: Dolores Patty, MD;  Location: [facility] ENDOSCOPY;  Service: Cardiovascular;  Laterality: N/A;   CARDIOVERSION N/A 09/14/2018   Procedure: CARDIOVERSION;  Surgeon: Wendall Stade, MD;  Location: [facility] ENDOSCOPY;  Service: Cardiovascular;  Laterality: N/A;   CARDIOVERSION N/A 10/19/2019   Procedure: CARDIOVERSION;  Surgeon: Dolores Patty, MD;  Location[facility] ENDOSCOPY;  Service: Cardiovascular;  Laterality: N/A;   CARDIOVERSION N/A 02/25/2020   Procedure: CARDIOVERSION;  Surgeon: Pricilla Riffle, MD;  Location: [facility] ENDOSCOPY;  Service: Cardiovascular;  Laterality: N/A;   CARDIOVERSION N/A 05/25/2020   Procedure:  CARDIOVERSION;  Surgeon: Lewayne Bunting, MD;  Location: [facility] ENDOSCOPY;  Service: Cardiovascular;  Laterality: N/A;   CARDIOVERSION N/A 07/10/2020   Procedure: CARDIOVERSION;  Surgeon: Donato Schultz  C, MD;  Location: [facility] ENDOSCOPY;  Service: Cardiovascular;  Laterality: N/A;   CLIPPING OF ATRIAL APPENDAGE N/A 08/22/2015   Procedure: CLIPPING OF ATRIAL APPENDAGE;  Surgeon: Kerin Perna, MD;  Location: [facility] OR;  Service: Open Heart Surgery;  Laterality: N/A;   COLONOSCOPY WITH PROPOFOL N/A 01/01/2022   Procedure: COLONOSCOPY WITH PROPOFOL;  Surgeon: Kathi Der, MD;  Location: [facility] ENDOSCOPY;  Service: Gastroenterology;  Laterality: N/A;   DOPPLER ECHOCARDIOGRAPHY  03/11/2002   EF 70-75%   FINGER SURGERY Left    Middle finger   FINGER TENDON REPAIR  1980's   "right little finger" (03/31/2012)   FLEXIBLE SIGMOIDOSCOPY N/A 12/30/2021   Procedure: FLEXIBLE SIGMOIDOSCOPY;  Surgeon: Charlott Rakes, MD;  Location: [facility] ENDOSCOPY;  Service: Gastroenterology;  Laterality: N/A;   INSERT / REPLACE / REMOVE PACEMAKER     St Jude   IR RADIOLOGIST EVAL & MGMT  05/06/2019   IR RADIOLOGIST EVAL & MGMT  10/06/2019   IR RADIOLOGIST EVAL & MGMT  10/31/2020   IR RADIOLOGIST EVAL & MGMT  01/18/2021   IR RADIOLOGIST EVAL & MGMT  03/08/2021   IR RADIOLOGIST EVAL & MGMT  06/13/2021   KIDNEY SURGERY Left    LEFT AND RIGHT HEART CATHETERIZATION WITH CORONARY ANGIOGRAM N/A 10/27/2012   Procedure: LEFT AND RIGHT HEART CATHETERIZATION WITH CORONARY ANGIOGRAM;  Surgeon: Pamella Pert, MD;  Location: [facility] CATH LAB;  Service: Cardiovascular;  Laterality: N/A;   LUMBAR DISC SURGERY  1980's X2;  2000's   MAZE N/A 08/22/2015   Procedure: MAZE;  Surgeon: Kerin Perna, MD;  Location: [facility] OR;  Service: Open Heart Surgery;  Laterality: N/A;   PACEMAKER INSERTION  03/31/2012   STJ Accent DR pacemaker implanted by Dr Johney Frame   PERMANENT PACEMAKER INSERTION N/A 03/31/2012   Procedure: PERMANENT PACEMAKER INSERTION;  Surgeon:  [name], MD;  Location: [facility] CATH LAB;  Service: Cardiovascular;  Laterality: N/A;   PPM GENERATOR CHANGEOUT N/A 08/08/2020   Procedure: PPM GENERATOR CHANGEOUT;  Surgeon: [name], MD;  Location: [facility] INVASIVE CV LAB;  Service: Cardiovascular;  Laterality: N/A;   RADIOLOGY WITH ANESTHESIA Left 02/14/2021   Procedure: RADIOLOGY WITH ANESTHESIA LEFT RENAL CRYOABLATION;  Surgeon: Irish Lack, MD;  Location: [facility] ORS;  Service: Radiology;  Laterality: Left;   STUMP REVISION Right 10/09/2022   Procedure: REVISION RIGHT BELOW KNEE AMPUTATION;  Surgeon: [name], MD;  Location: [facility] OR;  Service: Orthopedics;  Laterality: Right;   TEE WITHOUT CARDIOVERSION  01/30/2012   Procedure: TRANSESOPHAGEAL ECHOCARDIOGRAM (TEE);  Surgeon: Laurey Morale, MD;  Location: [facility] ENDOSCOPY;  Service: Cardiovascular;  Laterality: N/A;  ablation next day   TEE WITHOUT CARDIOVERSION N/A 08/22/2015   Procedure: TRANSESOPHAGEAL ECHOCARDIOGRAM (TEE);  Surgeon: Kerin Perna, MD;  Location: [facility] OR;  Service: Open Heart Surgery;  Laterality: N/A;   TEE WITHOUT CARDIOVERSION N/A 10/19/2019   Procedure: TRANSESOPHAGEAL ECHOCARDIOGRAM (TEE);  Surgeon: Dolores Patty, MD;  Location: [facility] ENDOSCOPY;  Service: Cardiovascular;  Laterality: N/A;   US ECHOCARDIOGRAPHY  08/07/2009   EF 55-60%   Social History   Occupational History   Occupation: retired  Tobacco Use   Smoking status: Former    Packs/day: 1.00    Years: 25.00    Additional pack years: 0.00    Total pack years: 25.00    Types: Cigarettes    Start date: 1963    Quit date: 07/18/1991    Years since quitting: 31.2    Passive exposure: Never   Smokeless  tobacco: Never  Vaping Use   Vaping Use: Never used  Substance and Sexual Activity   Alcohol use: Not Currently    Comment: occasional   Drug use: Yes    Types: Marijuana    Comment: Marijuana- for nausea   Sexual activity: Not Currently

## 2022-10-21 NOTE — Telephone Encounter (Signed)
Patient's wife called. Says the keflex is breaking him out would like something else.

## 2022-10-22 ENCOUNTER — Telehealth: Payer: Self-pay | Admitting: Orthopedic Surgery

## 2022-10-22 LAB — CULTURE, BLOOD (ROUTINE X 2)

## 2022-10-22 NOTE — Telephone Encounter (Signed)
Patient's wife called. She would like something called in for him. Says she thinks it is yeast. He is broke out.

## 2022-10-22 NOTE — Telephone Encounter (Signed)
Pts wife informed.

## 2022-10-23 ENCOUNTER — Other Ambulatory Visit: Payer: Self-pay | Admitting: Family

## 2022-10-23 MED ORDER — HYDROXYZINE HCL 10 MG PO TABS: 10.0000 mg | ORAL_TABLET | Freq: Three times a day (TID) | ORAL | 0 refills | Status: DC | PRN

## 2022-10-23 MED ORDER — FLUCONAZOLE 100 MG PO TABS: 100.0000 mg | ORAL_TABLET | Freq: Every day | ORAL | 0 refills | Status: AC

## 2022-10-23 NOTE — Progress Notes (Signed)
Spoke with wife she stopped the Keflex 48 hours ago due to concern for allergic reaction.   Unsure whether rash has improved since.  Having rash all over her body especially itchy and painful to the chest and groin.  As well as his genitals.  Concern for yeast infection.  Does have a history of the same.  Placed on Diflucan also given a prescription for Atarax.  Plan to follow-up in office tomorrow.

## 2022-10-24 ENCOUNTER — Encounter: Payer: Self-pay | Admitting: Orthopedic Surgery

## 2022-10-24 ENCOUNTER — Ambulatory Visit (INDEPENDENT_AMBULATORY_CARE_PROVIDER_SITE_OTHER): Payer: Medicare Other | Admitting: Orthopedic Surgery

## 2022-10-24 DIAGNOSIS — Z89511 Acquired absence of right leg below knee: Secondary | ICD-10-CM

## 2022-10-24 NOTE — Telephone Encounter (Signed)
Pt has an appt this afternoon. Will discuss with him during appt.

## 2022-10-24 NOTE — Progress Notes (Signed)
Office Visit Note   Patient: [name]           Date of Birth: 1951-04-07           MRN: [id number] Visit Date: 10/24/2022              Requested by: Alysia Penna, MD [address] [place],  [place] [address] PCP: Alysia Penna, MD  Chief Complaint  Patient presents with   Right Leg - Routine Post Op    10/09/2022 revision right BKA       HPI: Patient is 2 weeks status post revision right transtibial amputation.  Patient developed a systemic rash on Keflex.  He was started on Diflucan yesterday.  Assessment & Plan: Visit Diagnoses:  1. Right below-knee amputee [facility])     Plan: Plan to follow-up in 1 week to harvest the sutures.  He will continue with Dial soap cleansing dry dressing change and the stump shrinker.  Patient will call [facility] to begin the process of prosthetic fitting.  Follow-Up Instructions: Return in about 1 week (around 10/31/2022).   Ortho Exam  Patient is alert, oriented, no adenopathy, well-dressed, normal affect, normal respiratory effort. Examination there is no swelling there is dermatitis but no cellulitis no drainage the incision is well epithelialized.  We will leave the sutures in for 1 more week to decrease risk of wound dehiscence.  Imaging: No results found. No images are attached to the encounter.  Labs: Lab Results  Component Value Date   HGBA1C 6.5 (H) 12/27/2021   HGBA1C 6.6 (H) 06/11/2021   HGBA1C 6.7 (H) 02/02/2021   ESRSEDRATE 60 (H) 09/04/2015   REPTSTATUS 10/22/2022 FINAL 10/17/2022   GRAMSTAIN NO WBC SEEN NO ORGANISMS SEEN  10/09/2022   CULT  10/17/2022    NO GROWTH 5 DAYS Performed at [facility], [address] [place], [place] [address]    [facility] STAPHYLOCOCCUS AUREUS 10/09/2022   LABORGA STAPHYLOCOCCUS EPIDERMIDIS 10/09/2022     Lab Results  Component Value Date   ALBUMIN 3.3 (L) 10/17/2022   ALBUMIN 3.6 09/04/2022   ALBUMIN 4.2 06/20/2022   PREALBUMIN 25 09/04/2022    Lab Results   Component Value Date   MG 2.0 06/11/2021   MG 2.3 12/10/2020   MG 2.4 05/29/2020   No results found for: "VD25OH"  Lab Results  Component Value Date   PREALBUMIN 25 09/04/2022      Latest Ref Rng & Units 10/18/2022    1:25 AM 10/17/2022    8:00 AM 10/09/2022    9:17 AM  CBC EXTENDED  WBC 4.0 - 10.5 K/uL 8.9  11.9  8.9   RBC 4.22 - 5.81 MIL/uL 3.57  3.65  4.89   Hemoglobin 13.0 - 17.0 g/dL 86.5  78.4  69.6   HCT 39.0 - 52.0 % 34.1  33.9  46.2   Platelets 150 - 400 K/uL 154  193  165   NEUT# 1.7 - 7.7 K/uL  9.7    Lymph# 0.7 - 4.0 K/uL  0.8       There is no height or weight on file to calculate BMI.  Orders:  No orders of the defined types were placed in this encounter.  No orders of the defined types were placed in this encounter.    Procedures: No procedures performed  Clinical Data: No additional findings.  ROS:  All other systems negative, except as noted in the HPI. Review of Systems  Objective: Vital Signs: There were no  vitals taken for this visit.  Specialty Comments:  No specialty comments available.  PMFS History: Patient Active Problem List   Diagnosis Date Noted   Dyslipidemia 10/18/2022   Cellulitis of right lower extremity 10/17/2022   Dehiscence of amputation stump of right lower extremity (HCC) 10/09/2022   Below-knee amputation of right lower extremity (HCC) 10/09/2022   Gangrene of right foot (HCC) 09/04/2022   S/P BKA (below knee amputation), right (HCC) 09/04/2022   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) 08/23/2022   CAP (community acquired pneumonia) 12/28/2021   Constipation 12/28/2021   Pain in both lower extremities 07/03/2021   Peripheral neuropathy 07/03/2021   Acute metabolic encephalopathy 06/09/2021   Marijuana dependence (HCC) 06/09/2021   DNR (do not resuscitate) 06/09/2021   Chest pain 06/08/2021   Renal lesion 02/14/2021   Status post cryoablation 02/14/2021   Allergic rhinitis 12/08/2020   Preop  pulmonary/respiratory exam 03/27/2020   Acute on chronic diastolic (congestive) heart failure (HCC) 01/22/2020   Abdominal pain 01/22/2020   Hypothyroidism 12/13/2019   DM2 (diabetes mellitus, type 2) (HCC) 12/13/2019   Chronic kidney disease, stage 3b (HCC) 12/13/2019   Acute bacterial bronchitis 12/13/2019   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   Typical atrial flutter (HCC)    Chronic respiratory failure with hypoxia (HCC) 05/23/2016   CAD (coronary artery disease) 05/23/2016   Persistent atrial fibrillation (HCC)    Atypical atrial flutter (HCC)    Visit for monitoring Tikosyn therapy 04/16/2016   Chronic diastolic CHF (congestive heart failure) (HCC) 02/03/2016   Cough 12/06/2015   Acute on chronic respiratory failure with hypoxia (HCC) 09/08/2015   Hallucinations 09/04/2015   Lymphadenopathy 09/04/2015   Ascending aortic aneurysm (HCC) 09/04/2015   Hypoxia 09/02/2015   S/P aortic valve replacement with bioprosthetic valve 09/02/2015   Pleural effusion 09/02/2015   S/P AVR 08/22/2015   GERD without esophagitis 09/20/2014   Intrinsic asthma 10/13/2013   Dyspnea 09/15/2013   Pulmonary hypertension (HCC) 09/15/2013   Shortness of breath 05/05/2013   Pacemaker-St.Jude 04/01/2012   Bradycardia 03/31/2012   Second degree AV block 03/31/2012   AV block, 1st degree 02/01/2012   PAF (paroxysmal atrial fibrillation) (HCC) 11/13/2011   Fatigue 11/13/2011   Essential hypertension 11/13/2011   Aortic stenosis 11/13/2011   Sleep apnea 11/13/2011   Past Medical History:  Diagnosis Date   Aortic stenosis, mild    Arthritis of hand    "just a little bit in both hands" (03/31/2012)   Asthma    "little bit" (03/31/2012)   Atrial fibrillation (HCC)    dx '04; DCCV '04, placed on flecainide, failed DCCV 04/2010, flecainide stopped; s/p successful A.fib ablation 01/31/12   CHF (congestive heart failure) (HCC)    Controlled type 2 diabetes mellitus without complication, without long-term  current use of insulin (HCC) 12/13/2019   COPD (chronic obstructive pulmonary disease) (HCC)    DJD (degenerative joint disease)    Fatty liver    Fibromyalgia    GERD (gastroesophageal reflux disease)    Hyperlipidemia    Hypertension    Hypothyroidism    S/P radiation   Left atrial enlargement    LA size 45mm by echo 11/21/11   Mitral regurgitation    trivial   Obstructive sleep apnea    mild by sleep study 2013; pt stated he does not have a machine because "it wasn't bad enough for him to have one"   Pacemaker 03/31/2012   Pneumonia    last time 2023  Restless leg syndrome    Second degree AV block    Splenomegaly     Family History  Problem Relation Age of Onset   Heart failure Mother    Stroke Father    Neuropathy Neg Hx     Past Surgical History:  Procedure Laterality Date   AMPUTATION Right 08/23/2022   Procedure: RIGHT FOOT FIFTH RAY AMPUTATION;  Surgeon: [name], MD;  Location: [facility] OR;  Service: Orthopedics;  Laterality: Right;   AMPUTATION Right 09/04/2022   Procedure: RIGHT BELOW KNEE AMPUTATION;  Surgeon: [name], MD;  Location: [facility] OR;  Service: Orthopedics;  Laterality: Right;   AORTIC VALVE REPLACEMENT N/A 08/22/2015   Procedure: AORTIC VALVE REPLACEMENT (AVR);  Surgeon: Kerin Perna, MD;  Location: [facility] OR;  Service: Open Heart Surgery;  Laterality: N/A;   ATRIAL FIBRILLATION ABLATION  01/30/2012   PVI by Dr. Johney Frame   ATRIAL FIBRILLATION ABLATION N/A 01/31/2012   Procedure: ATRIAL FIBRILLATION ABLATION;  Surgeon: [name], MD;  Location: [facility] CATH LAB;  Service: Cardiovascular;  Laterality: N/A;   ATRIAL FIBRILLATION ABLATION N/A 04/19/2020   Procedure: ATRIAL FIBRILLATION ABLATION;  Surgeon: [name], MD;  Location: [facility] INVASIVE CV LAB;  Service: Cardiovascular;  Laterality: N/A;   BACK SURGERY     X 3   BIOPSY  01/01/2022   Procedure: BIOPSY;  Surgeon: Kathi Der, MD;  Location: [facility] ENDOSCOPY;  Service: Gastroenterology;;   CARDIAC  CATHETERIZATION N/A 08/09/2015   Procedure: Right/Left Heart Cath and Coronary Angiography;  Surgeon: Peter M Swaziland, MD;  Location: [facility] INVASIVE CV LAB;  Service: Cardiovascular;  Laterality: N/A;   CARDIAC CATHETERIZATION N/A 02/05/2016   Procedure: Right/Left Heart Cath and Coronary Angiography;  Surgeon: Corky Crafts, MD;  Location: [facility] INVASIVE CV LAB;  Service: Cardiovascular;  Laterality: N/A;   CARDIAC CATHETERIZATION N/A 04/17/2016   Procedure: Right Heart Cath;  Surgeon: Dolores Patty, MD;  Location: [facility] INVASIVE CV LAB;  Service: Cardiovascular;  Laterality: N/A;   CARDIOVASCULAR STRESS TEST  03/12/2002   EF 48%, NO EVIDENCE OF ISCHEMIA   CARDIOVERSION  01/2012; 03/31/2012   CARDIOVERSION N/A 02/25/2012   Procedure: CARDIOVERSION;  Surgeon: [name], MD;  Location: [facility] CATH LAB;  Service: Cardiovascular;  Laterality: N/A;   CARDIOVERSION N/A 03/31/2012   Procedure: CARDIOVERSION;  Surgeon: [name], MD;  Location: [facility] CATH LAB;  Service: Cardiovascular;  Laterality: N/A;   CARDIOVERSION N/A 11/23/2015   Procedure: CARDIOVERSION;  Surgeon: Laurey Morale, MD;  Location: [facility] ENDOSCOPY;  Service: Cardiovascular;  Laterality: N/A;   CARDIOVERSION N/A 04/08/2016   Procedure: CARDIOVERSION;  Surgeon: Dolores Patty, MD;  Location: [facility] ENDOSCOPY;  Service: Cardiovascular;  Laterality: N/A;   CARDIOVERSION N/A 09/14/2018   Procedure: CARDIOVERSION;  Surgeon: Wendall Stade, MD;  Location: [facility] ENDOSCOPY;  Service: Cardiovascular;  Laterality: N/A;   CARDIOVERSION N/A 10/19/2019   Procedure: CARDIOVERSION;  Surgeon: Dolores Patty, MD;  Location: [facility] ENDOSCOPY;  Service: Cardiovascular;  Laterality: N/A;   CARDIOVERSION N/A 02/25/2020   Procedure: CARDIOVERSION;  Surgeon: Pricilla Riffle, MD;  Location: [facility] ENDOSCOPY;  Service: Cardiovascular;  Laterality: N/A;   CARDIOVERSION N/A 05/25/2020   Procedure: CARDIOVERSION;  Surgeon: Lewayne Bunting, MD;  Location[facility]  ENDOSCOPY;  Service: Cardiovascular;  Laterality: N/A;   CARDIOVERSION N/A 07/10/2020   Procedure: CARDIOVERSION;  Surgeon: Jake Bathe, MD;  Location: [facility] ENDOSCOPY;  Service: Cardiovascular;  Laterality: N/A;   CLIPPING OF ATRIAL APPENDAGE N/A 08/22/2015   Procedure:  CLIPPING OF ATRIAL APPENDAGE;  Surgeon: Kerin Perna, MD;  Location: [facility] OR;  Service: Open Heart Surgery;  Laterality: N/A;   COLONOSCOPY WITH PROPOFOL N/A 01/01/2022   Procedure: COLONOSCOPY WITH PROPOFOL;  Surgeon: Kathi Der, MD;  Location: [facility] ENDOSCOPY;  Service: Gastroenterology;  Laterality: N/A;   DOPPLER ECHOCARDIOGRAPHY  03/11/2002   EF 70-75%   FINGER SURGERY Left    Middle finger   FINGER TENDON REPAIR  1980's   "right little finger" (03/31/2012)   FLEXIBLE SIGMOIDOSCOPY N/A 12/30/2021   Procedure: FLEXIBLE SIGMOIDOSCOPY;  Surgeon: Charlott Rakes, MD;  Location: [facility] ENDOSCOPY;  Service: Gastroenterology;  Laterality: N/A;   INSERT / REPLACE / REMOVE PACEMAKER     St Jude   IR RADIOLOGIST EVAL & MGMT  05/06/2019   IR RADIOLOGIST EVAL & MGMT  10/06/2019   IR RADIOLOGIST EVAL & MGMT  10/31/2020   IR RADIOLOGIST EVAL & MGMT  01/18/2021   IR RADIOLOGIST EVAL & MGMT  03/08/2021   IR RADIOLOGIST EVAL & MGMT  06/13/2021   KIDNEY SURGERY Left    LEFT AND RIGHT HEART CATHETERIZATION WITH CORONARY ANGIOGRAM N/A 10/27/2012   Procedure: LEFT AND RIGHT HEART CATHETERIZATION WITH CORONARY ANGIOGRAM;  Surgeon: Pamella Pert, MD;  Location: [facility] CATH LAB;  Service: Cardiovascular;  Laterality: N/A;   LUMBAR DISC SURGERY  1980's X2;  2000's   MAZE N/A 08/22/2015   Procedure: MAZE;  Surgeon: Kerin Perna, MD;  Location: [facility] OR;  Service: Open Heart Surgery;  Laterality: N/A;   PACEMAKER INSERTION  03/31/2012   STJ Accent DR pacemaker implanted by Dr Johney Frame   PERMANENT PACEMAKER INSERTION N/A 03/31/2012   Procedure: PERMANENT PACEMAKER INSERTION;  Surgeon: [name], MD;  Location: [facility] CATH LAB;  Service:  Cardiovascular;  Laterality: N/A;   PPM GENERATOR CHANGEOUT N/A 08/08/2020   Procedure: PPM GENERATOR CHANGEOUT;  Surgeon: [name], MD;  Location: [facility] INVASIVE CV LAB;  Service: Cardiovascular;  Laterality: N/A;   RADIOLOGY WITH ANESTHESIA Left 02/14/2021   Procedure: RADIOLOGY WITH ANESTHESIA LEFT RENAL CRYOABLATION;  Surgeon: Irish Lack, MD;  Location: [facility] ORS;  Service: Radiology;  Laterality: Left;   STUMP REVISION Right 10/09/2022   Procedure: REVISION RIGHT BELOW KNEE AMPUTATION;  Surgeon: [name], MD;  Location: [facility] OR;  Service: Orthopedics;  Laterality: Right;   TEE WITHOUT CARDIOVERSION  01/30/2012   Procedure: TRANSESOPHAGEAL ECHOCARDIOGRAM (TEE);  Surgeon: Laurey Morale, MD;  Location: [facility] ENDOSCOPY;  Service: Cardiovascular;  Laterality: N/A;  ablation next day   TEE WITHOUT CARDIOVERSION N/A 08/22/2015   Procedure: TRANSESOPHAGEAL ECHOCARDIOGRAM (TEE);  Surgeon: Kerin Perna, MD;  Location: [facility] OR;  Service: Open Heart Surgery;  Laterality: N/A;   TEE WITHOUT CARDIOVERSION N/A 10/19/2019   Procedure: TRANSESOPHAGEAL ECHOCARDIOGRAM (TEE);  Surgeon: Dolores Patty, MD;  Location: [facility] ENDOSCOPY;  Service: Cardiovascular;  Laterality: N/A;   US ECHOCARDIOGRAPHY  08/07/2009   EF 55-60%   Social History   Occupational History   Occupation: retired  Tobacco Use   Smoking status: Former    Packs/day: 1.00    Years: 25.00    Additional pack years: 0.00    Total pack years: 25.00    Types: Cigarettes    Start date: 1963    Quit date: 07/18/1991    Years since quitting: 31.2    Passive exposure: Never   Smokeless tobacco: Never  Vaping Use   Vaping Use: Never used  Substance and Sexual Activity   Alcohol use: Not Currently  Comment: occasional   Drug use: Yes    Types: Marijuana    Comment: Marijuana- for nausea   Sexual activity: Not Currently

## 2022-10-25 ENCOUNTER — Encounter: Payer: Medicare Other | Admitting: Family

## 2022-11-01 ENCOUNTER — Ambulatory Visit (INDEPENDENT_AMBULATORY_CARE_PROVIDER_SITE_OTHER): Payer: Medicare Other | Admitting: Family

## 2022-11-01 ENCOUNTER — Encounter: Payer: Self-pay | Admitting: Family

## 2022-11-01 DIAGNOSIS — Z89511 Acquired absence of right leg below knee: Secondary | ICD-10-CM

## 2022-11-01 MED ORDER — OXYCODONE-ACETAMINOPHEN 5-325 MG PO TABS: 1.0000 | ORAL_TABLET | Freq: Four times a day (QID) | ORAL | 0 refills | Status: DC | PRN

## 2022-11-01 NOTE — Progress Notes (Signed)
Post-Op Visit Note   Patient: [name]           Date of Birth: Sep 14, 1950           MRN: [id number] Visit Date: 11/01/2022 PCP: Alysia Penna, MD  Chief Complaint:  Chief Complaint  Patient presents with   Right Leg - Routine Post Op    10/09/2022 revision right BKA     HPI:  HPI The patient is a 72 year old gentleman seen status post revision right below-knee amputation June 12 the patient was concerned about a poor look of his incision a few days ago there was some serous yellow drainage they began taking some Bactrim DS that they had at home have seen and improved since. Ortho Exam On examination of the right residual limb sutures in place there is 1 remaining area a centimeter in length that has not yet healed this is gaped 5 mm there is granulation in the bed with 2 drops of serous drainage there is no surrounding erythema  Visit Diagnoses: No diagnosis found.  Plan: Sutures harvested today without incident.  They will complete the course of Bactrim continue dry dressings and shrinker proceed with prosthesis set up follow-up in 4 weeks  Follow-Up Instructions: No follow-ups on file.   Imaging: No results found.  Orders:  No orders of the defined types were placed in this encounter.  No orders of the defined types were placed in this encounter.    PMFS History: Patient Active Problem List   Diagnosis Date Noted   Dyslipidemia 10/18/2022   Cellulitis of right lower extremity 10/17/2022   Dehiscence of amputation stump of right lower extremity (HCC) 10/09/2022   Below-knee amputation of right lower extremity (HCC) 10/09/2022   Gangrene of right foot (HCC) 09/04/2022   S/P BKA (below knee amputation), right (HCC) 09/04/2022   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) 08/23/2022   CAP (community acquired pneumonia) 12/28/2021   Constipation 12/28/2021   Pain in both lower extremities 07/03/2021   Peripheral neuropathy 07/03/2021   Acute metabolic  encephalopathy 06/09/2021   Marijuana dependence (HCC) 06/09/2021   DNR (do not resuscitate) 06/09/2021   Chest pain 06/08/2021   Renal lesion 02/14/2021   Status post cryoablation 02/14/2021   Allergic rhinitis 12/08/2020   Preop pulmonary/respiratory exam 03/27/2020   Acute on chronic diastolic (congestive) heart failure (HCC) 01/22/2020   Abdominal pain 01/22/2020   Hypothyroidism 12/13/2019   DM2 (diabetes mellitus, type 2) (HCC) 12/13/2019   Chronic kidney disease, stage 3b (HCC) 12/13/2019   Acute bacterial bronchitis 12/13/2019   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   Typical atrial flutter (HCC)    Chronic respiratory failure with hypoxia (HCC) 05/23/2016   CAD (coronary artery disease) 05/23/2016   Persistent atrial fibrillation (HCC)    Atypical atrial flutter (HCC)    Visit for monitoring Tikosyn therapy 04/16/2016   Chronic diastolic CHF (congestive heart failure) (HCC) 02/03/2016   Cough 12/06/2015   Acute on chronic respiratory failure with hypoxia (HCC) 09/08/2015   Hallucinations 09/04/2015   Lymphadenopathy 09/04/2015   Ascending aortic aneurysm (HCC) 09/04/2015   Hypoxia 09/02/2015   S/P aortic valve replacement with bioprosthetic valve 09/02/2015   Pleural effusion 09/02/2015   S/P AVR 08/22/2015   GERD without esophagitis 09/20/2014   Intrinsic asthma 10/13/2013   Dyspnea 09/15/2013   Pulmonary hypertension (HCC) 09/15/2013   Shortness of breath 05/05/2013   Pacemaker-St.Jude 04/01/2012   Bradycardia 03/31/2012   Second degree AV block 03/31/2012  AV block, 1st degree 02/01/2012   PAF (paroxysmal atrial fibrillation) (HCC) 11/13/2011   Fatigue 11/13/2011   Essential hypertension 11/13/2011   Aortic stenosis 11/13/2011   Sleep apnea 11/13/2011   Past Medical History:  Diagnosis Date   Aortic stenosis, mild    Arthritis of hand    "just a little bit in both hands" (03/31/2012)   Asthma    "little bit" (03/31/2012)   Atrial fibrillation (HCC)    dx  '04; DCCV '04, placed on flecainide, failed DCCV 04/2010, flecainide stopped; s/p successful A.fib ablation 01/31/12   CHF (congestive heart failure) (HCC)    Controlled type 2 diabetes mellitus without complication, without long-term current use of insulin (HCC) 12/13/2019   COPD (chronic obstructive pulmonary disease) (HCC)    DJD (degenerative joint disease)    Fatty liver    Fibromyalgia    GERD (gastroesophageal reflux disease)    Hyperlipidemia    Hypertension    Hypothyroidism    S/P radiation   Left atrial enlargement    LA size 45mm by echo 11/21/11   Mitral regurgitation    trivial   Obstructive sleep apnea    mild by sleep study 2013; pt stated he does not have a machine because "it wasn't bad enough for him to have one"   Pacemaker 03/31/2012   Pneumonia    last time 2023   Restless leg syndrome    Second degree AV block    Splenomegaly     Family History  Problem Relation Age of Onset   Heart failure Mother    Stroke Father    Neuropathy Neg Hx     Past Surgical History:  Procedure Laterality Date   AMPUTATION Right 08/23/2022   Procedure: RIGHT FOOT FIFTH RAY AMPUTATION;  Surgeon: [name], MD;  Location: [facility] OR;  Service: Orthopedics;  Laterality: Right;   AMPUTATION Right 09/04/2022   Procedure: RIGHT BELOW KNEE AMPUTATION;  Surgeon: [name], MD;  Location: [facility] OR;  Service: Orthopedics;  Laterality: Right;   AORTIC VALVE REPLACEMENT N/A 08/22/2015   Procedure: AORTIC VALVE REPLACEMENT (AVR);  Surgeon: Kerin Perna, MD;  Location: [facility] OR;  Service: Open Heart Surgery;  Laterality: N/A;   ATRIAL FIBRILLATION ABLATION  01/30/2012   PVI by Dr. Johney Frame   ATRIAL FIBRILLATION ABLATION N/A 01/31/2012   Procedure: ATRIAL FIBRILLATION ABLATION;  Surgeon: [name], MD;  Location: [facility] CATH LAB;  Service: Cardiovascular;  Laterality: N/A;   ATRIAL FIBRILLATION ABLATION N/A 04/19/2020   Procedure: ATRIAL FIBRILLATION ABLATION;  Surgeon: [name], MD;   Location: [facility] INVASIVE CV LAB;  Service: Cardiovascular;  Laterality: N/A;   BACK SURGERY     X 3   BIOPSY  01/01/2022   Procedure: BIOPSY;  Surgeon: Kathi Der, MD;  Location: [facility] ENDOSCOPY;  Service: Gastroenterology;;   CARDIAC CATHETERIZATION N/A 08/09/2015   Procedure: Right/Left Heart Cath and Coronary Angiography;  Surgeon: Peter M Swaziland, MD;  Location: [facility] INVASIVE CV LAB;  Service: Cardiovascular;  Laterality: N/A;   CARDIAC CATHETERIZATION N/A 02/05/2016   Procedure: Right/Left Heart Cath and Coronary Angiography;  Surgeon: Corky Crafts, MD;  Location: [facility] INVASIVE CV LAB;  Service: Cardiovascular;  Laterality: N/A;   CARDIAC CATHETERIZATION N/A 04/17/2016   Procedure: Right Heart Cath;  Surgeon: Dolores Patty, MD;  Location: [facility] INVASIVE CV LAB;  Service: Cardiovascular;  Laterality: N/A;   CARDIOVASCULAR STRESS TEST  03/12/2002   EF 48%, NO EVIDENCE OF ISCHEMIA   CARDIOVERSION  01/2012;  03/31/2012   CARDIOVERSION N/A 02/25/2012   Procedure: CARDIOVERSION;  Surgeon: [name], MD;  Location: [facility] CATH LAB;  Service: Cardiovascular;  Laterality: N/A;   CARDIOVERSION N/A 03/31/2012   Procedure: CARDIOVERSION;  Surgeon: [name], MD;  Location: [facility] CATH LAB;  Service: Cardiovascular;  Laterality: N/A;   CARDIOVERSION N/A 11/23/2015   Procedure: CARDIOVERSION;  Surgeon: Laurey Morale, MD;  Location: [facility] ENDOSCOPY;  Service: Cardiovascular;  Laterality: N/A;   CARDIOVERSION N/A 04/08/2016   Procedure: CARDIOVERSION;  Surgeon: Dolores Patty, MD;  Location: [facility] ENDOSCOPY;  Service: Cardiovascular;  Laterality: N/A;   CARDIOVERSION N/A 09/14/2018   Procedure: CARDIOVERSION;  Surgeon: Wendall Stade, MD;  Location: [facility] ENDOSCOPY;  Service: Cardiovascular;  Laterality: N/A;   CARDIOVERSION N/A 10/19/2019   Procedure: CARDIOVERSION;  Surgeon: Dolores Patty, MD;  Location: [facility] ENDOSCOPY;  Service: Cardiovascular;  Laterality: N/A;   CARDIOVERSION N/A 02/25/2020    Procedure: CARDIOVERSION;  Surgeon: Pricilla Riffle, MD;  Location: [facility] ENDOSCOPY;  Service: Cardiovascular;  Laterality: N/A;   CARDIOVERSION N/A 05/25/2020   Procedure: CARDIOVERSION;  Surgeon: Lewayne Bunting, MD;  Location: [facility] ENDOSCOPY;  Service: Cardiovascular;  Laterality: N/A;   CARDIOVERSION N/A 07/10/2020   Procedure: CARDIOVERSION;  Surgeon: Jake Bathe, MD;  Location: [facility] ENDOSCOPY;  Service: Cardiovascular;  Laterality: N/A;   CLIPPING OF ATRIAL APPENDAGE N/A 08/22/2015   Procedure: CLIPPING OF ATRIAL APPENDAGE;  Surgeon: Kerin Perna, MD;  Location: [facility] OR;  Service: Open Heart Surgery;  Laterality: N/A;   COLONOSCOPY WITH PROPOFOL N/A 01/01/2022   Procedure: COLONOSCOPY WITH PROPOFOL;  Surgeon: Kathi Der, MD;  Location: [facility] ENDOSCOPY;  Service: Gastroenterology;  Laterality: N/A;   DOPPLER ECHOCARDIOGRAPHY  03/11/2002   EF 70-75%   FINGER SURGERY Left    Middle finger   FINGER TENDON REPAIR  1980's   "right little finger" (03/31/2012)   FLEXIBLE SIGMOIDOSCOPY N/A 12/30/2021   Procedure: FLEXIBLE SIGMOIDOSCOPY;  Surgeon: Charlott Rakes, MD;  Location: [facility] ENDOSCOPY;  Service: Gastroenterology;  Laterality: N/A;   INSERT / REPLACE / REMOVE PACEMAKER     St Jude   IR RADIOLOGIST EVAL & MGMT  05/06/2019   IR RADIOLOGIST EVAL & MGMT  10/06/2019   IR RADIOLOGIST EVAL & MGMT  10/31/2020   IR RADIOLOGIST EVAL & MGMT  01/18/2021   IR RADIOLOGIST EVAL & MGMT  03/08/2021   IR RADIOLOGIST EVAL & MGMT  06/13/2021   KIDNEY SURGERY Left    LEFT AND RIGHT HEART CATHETERIZATION WITH CORONARY ANGIOGRAM N/A 10/27/2012   Procedure: LEFT AND RIGHT HEART CATHETERIZATION WITH CORONARY ANGIOGRAM;  Surgeon: Pamella Pert, MD;  Location: [facility] CATH LAB;  Service: Cardiovascular;  Laterality: N/A;   LUMBAR DISC SURGERY  1980's X2;  2000's   MAZE N/A 08/22/2015   Procedure: MAZE;  Surgeon: Kerin Perna, MD;  Location: [facility] OR;  Service: Open Heart Surgery;  Laterality: N/A;   PACEMAKER  INSERTION  03/31/2012   STJ Accent DR pacemaker implanted by Dr Johney Frame   PERMANENT PACEMAKER INSERTION N/A 03/31/2012   Procedure: PERMANENT PACEMAKER INSERTION;  Surgeon: [name], MD;  Location: [facility] CATH LAB;  Service: Cardiovascular;  Laterality: N/A;   PPM GENERATOR CHANGEOUT N/A 08/08/2020   Procedure: PPM GENERATOR CHANGEOUT;  Surgeon: [name], MD;  Location: [facility] INVASIVE CV LAB;  Service: Cardiovascular;  Laterality: N/A;   RADIOLOGY WITH ANESTHESIA Left 02/14/2021   Procedure: RADIOLOGY WITH ANESTHESIA LEFT RENAL CRYOABLATION;  Surgeon: Irish Lack, MD;  Location: [facility] ORS;  Service: Radiology;  Laterality: Left;   STUMP REVISION Right 10/09/2022   Procedure: REVISION RIGHT BELOW KNEE AMPUTATION;  Surgeon: [name], MD;  Location: [facility] OR;  Service: Orthopedics;  Laterality: Right;   TEE WITHOUT CARDIOVERSION  01/30/2012   Procedure: TRANSESOPHAGEAL ECHOCARDIOGRAM (TEE);  Surgeon: Laurey Morale, MD;  Location: [facility] ENDOSCOPY;  Service: Cardiovascular;  Laterality: N/A;  ablation next day   TEE WITHOUT CARDIOVERSION N/A 08/22/2015   Procedure: TRANSESOPHAGEAL ECHOCARDIOGRAM (TEE);  Surgeon: Kerin Perna, MD;  Location: [facility] OR;  Service: Open Heart Surgery;  Laterality: N/A;   TEE WITHOUT CARDIOVERSION N/A 10/19/2019   Procedure: TRANSESOPHAGEAL ECHOCARDIOGRAM (TEE);  Surgeon: Dolores Patty, MD;  Location: [facility] ENDOSCOPY;  Service: Cardiovascular;  Laterality: N/A;   US ECHOCARDIOGRAPHY  08/07/2009   EF 55-60%   Social History   Occupational History   Occupation: retired  Tobacco Use   Smoking status: Former    Packs/day: 1.00    Years: 25.00    Additional pack years: 0.00    Total pack years: 25.00    Types: Cigarettes    Start date: 1963    Quit date: 07/18/1991    Years since quitting: 31.3    Passive exposure: Never   Smokeless tobacco: Never  Vaping Use   Vaping Use: Never used  Substance and Sexual Activity   Alcohol use: Not Currently    Comment:  occasional   Drug use: Yes    Types: Marijuana    Comment: Marijuana- for nausea   Sexual activity: Not Currently

## 2022-11-05 ENCOUNTER — Telehealth: Payer: Self-pay

## 2022-11-05 NOTE — Telephone Encounter (Signed)
SW pt's wife, she states Denny Peon SW her this morning after [employer] were sent to her.

## 2022-11-07 ENCOUNTER — Ambulatory Visit (INDEPENDENT_AMBULATORY_CARE_PROVIDER_SITE_OTHER): Payer: Medicare Other

## 2022-11-07 DIAGNOSIS — Z950 Presence of cardiac pacemaker: Secondary | ICD-10-CM | POA: Diagnosis not present

## 2022-11-07 LAB — CUP PACEART REMOTE DEVICE CHECK
Battery Remaining Longevity: 115 mo
Battery Remaining Percentage: 84 %
Battery Voltage: 3.02 V
Brady Statistic RV Percent Paced: 99 %
Date Time Interrogation Session: 20240711040022
Implantable Lead Connection Status: 753985
Implantable Lead Connection Status: 753985
Implantable Lead Implant Date: 20131203
Implantable Lead Implant Date: 20131203
Implantable Lead Location: 753859
Implantable Lead Location: 753860
Implantable Lead Model: 1948
Implantable Pulse Generator Implant Date: 20220412
Lead Channel Impedance Value: 680 Ohm
Lead Channel Pacing Threshold Amplitude: 1.25 V
Lead Channel Pacing Threshold Pulse Width: 0.5 ms
Lead Channel Sensing Intrinsic Amplitude: 8.8 mV
Lead Channel Setting Pacing Amplitude: 1.5 V
Lead Channel Setting Pacing Pulse Width: 0.5 ms
Lead Channel Setting Sensing Sensitivity: 4 mV
Pulse Gen Model: 2272
Pulse Gen Serial Number: 3919039

## 2022-11-11 ENCOUNTER — Encounter: Payer: Self-pay | Admitting: Orthopedic Surgery

## 2022-11-12 ENCOUNTER — Ambulatory Visit (INDEPENDENT_AMBULATORY_CARE_PROVIDER_SITE_OTHER): Payer: Medicare Other | Admitting: Orthopedic Surgery

## 2022-11-12 DIAGNOSIS — Z89511 Acquired absence of right leg below knee: Secondary | ICD-10-CM

## 2022-11-12 DIAGNOSIS — T8781 Dehiscence of amputation stump: Secondary | ICD-10-CM

## 2022-11-12 MED ORDER — DOXYCYCLINE HYCLATE 100 MG PO TABS: 100.0000 mg | ORAL_TABLET | Freq: Two times a day (BID) | ORAL | 0 refills | Status: DC

## 2022-11-13 ENCOUNTER — Emergency Department (HOSPITAL_COMMUNITY): Payer: Medicare Other

## 2022-11-13 ENCOUNTER — Inpatient Hospital Stay (HOSPITAL_COMMUNITY)
Admission: EM | Admit: 2022-11-13 | Discharge: 2022-11-18 | DRG: 463 | Disposition: A | Discharge status: Home / Self Care | Payer: Medicare Other | Attending: Internal Medicine | Admitting: Internal Medicine

## 2022-11-13 ENCOUNTER — Encounter (HOSPITAL_COMMUNITY): Payer: Self-pay

## 2022-11-13 ENCOUNTER — Other Ambulatory Visit: Payer: Self-pay

## 2022-11-13 DIAGNOSIS — W19XXXA Unspecified fall, initial encounter: Secondary | ICD-10-CM | POA: Diagnosis not present

## 2022-11-13 DIAGNOSIS — Z952 Presence of prosthetic heart valve: Secondary | ICD-10-CM

## 2022-11-13 DIAGNOSIS — Z89511 Acquired absence of right leg below knee: Secondary | ICD-10-CM

## 2022-11-13 DIAGNOSIS — Z923 Personal history of irradiation: Secondary | ICD-10-CM

## 2022-11-13 DIAGNOSIS — M79604 Pain in right leg: Secondary | ICD-10-CM | POA: Diagnosis not present

## 2022-11-13 DIAGNOSIS — I130 Hypertensive heart and chronic kidney disease with heart failure and stage 1 through stage 4 chronic kidney disease, or unspecified chronic kidney disease: Secondary | ICD-10-CM | POA: Diagnosis present

## 2022-11-13 DIAGNOSIS — K760 Fatty (change of) liver, not elsewhere classified: Secondary | ICD-10-CM | POA: Diagnosis present

## 2022-11-13 DIAGNOSIS — Y835 Amputation of limb(s) as the cause of abnormal reaction of the patient, or of later complication, without mention of misadventure at the time of the procedure: Secondary | ICD-10-CM | POA: Diagnosis present

## 2022-11-13 DIAGNOSIS — E871 Hypo-osmolality and hyponatremia: Secondary | ICD-10-CM | POA: Diagnosis present

## 2022-11-13 DIAGNOSIS — E1142 Type 2 diabetes mellitus with diabetic polyneuropathy: Secondary | ICD-10-CM | POA: Diagnosis present

## 2022-11-13 DIAGNOSIS — G2581 Restless legs syndrome: Secondary | ICD-10-CM | POA: Diagnosis present

## 2022-11-13 DIAGNOSIS — Z8249 Family history of ischemic heart disease and other diseases of the circulatory system: Secondary | ICD-10-CM

## 2022-11-13 DIAGNOSIS — E1122 Type 2 diabetes mellitus with diabetic chronic kidney disease: Secondary | ICD-10-CM | POA: Diagnosis present

## 2022-11-13 DIAGNOSIS — Z950 Presence of cardiac pacemaker: Secondary | ICD-10-CM

## 2022-11-13 DIAGNOSIS — Z66 Do not resuscitate: Secondary | ICD-10-CM | POA: Diagnosis present

## 2022-11-13 DIAGNOSIS — Z79890 Hormone replacement therapy (postmenopausal): Secondary | ICD-10-CM | POA: Diagnosis not present

## 2022-11-13 DIAGNOSIS — D649 Anemia, unspecified: Secondary | ICD-10-CM | POA: Diagnosis not present

## 2022-11-13 DIAGNOSIS — I480 Paroxysmal atrial fibrillation: Secondary | ICD-10-CM | POA: Diagnosis present

## 2022-11-13 DIAGNOSIS — Z881 Allergy status to other antibiotic agents status: Secondary | ICD-10-CM | POA: Diagnosis not present

## 2022-11-13 DIAGNOSIS — G4733 Obstructive sleep apnea (adult) (pediatric): Secondary | ICD-10-CM | POA: Diagnosis present

## 2022-11-13 DIAGNOSIS — E039 Hypothyroidism, unspecified: Secondary | ICD-10-CM | POA: Diagnosis present

## 2022-11-13 DIAGNOSIS — E11622 Type 2 diabetes mellitus with other skin ulcer: Secondary | ICD-10-CM | POA: Insufficient documentation

## 2022-11-13 DIAGNOSIS — I441 Atrioventricular block, second degree: Secondary | ICD-10-CM | POA: Diagnosis present

## 2022-11-13 DIAGNOSIS — T8133XA Disruption of traumatic injury wound repair, initial encounter: Secondary | ICD-10-CM | POA: Diagnosis not present

## 2022-11-13 DIAGNOSIS — Z87891 Personal history of nicotine dependence: Secondary | ICD-10-CM

## 2022-11-13 DIAGNOSIS — Z8701 Personal history of pneumonia (recurrent): Secondary | ICD-10-CM

## 2022-11-13 DIAGNOSIS — M869 Osteomyelitis, unspecified: Secondary | ICD-10-CM | POA: Diagnosis present

## 2022-11-13 DIAGNOSIS — Z7901 Long term (current) use of anticoagulants: Secondary | ICD-10-CM

## 2022-11-13 DIAGNOSIS — M86661 Other chronic osteomyelitis, right tibia and fibula: Secondary | ICD-10-CM | POA: Diagnosis not present

## 2022-11-13 DIAGNOSIS — T8781 Dehiscence of amputation stump: Secondary | ICD-10-CM | POA: Diagnosis not present

## 2022-11-13 DIAGNOSIS — N182 Chronic kidney disease, stage 2 (mild): Secondary | ICD-10-CM | POA: Diagnosis present

## 2022-11-13 DIAGNOSIS — N179 Acute kidney failure, unspecified: Secondary | ICD-10-CM | POA: Diagnosis present

## 2022-11-13 DIAGNOSIS — M19042 Primary osteoarthritis, left hand: Secondary | ICD-10-CM | POA: Diagnosis present

## 2022-11-13 DIAGNOSIS — I482 Chronic atrial fibrillation, unspecified: Secondary | ICD-10-CM | POA: Diagnosis present

## 2022-11-13 DIAGNOSIS — D62 Acute posthemorrhagic anemia: Secondary | ICD-10-CM | POA: Diagnosis not present

## 2022-11-13 DIAGNOSIS — J4489 Other specified chronic obstructive pulmonary disease: Secondary | ICD-10-CM | POA: Diagnosis present

## 2022-11-13 DIAGNOSIS — S0093XA Contusion of unspecified part of head, initial encounter: Secondary | ICD-10-CM | POA: Diagnosis present

## 2022-11-13 DIAGNOSIS — K219 Gastro-esophageal reflux disease without esophagitis: Secondary | ICD-10-CM | POA: Diagnosis present

## 2022-11-13 DIAGNOSIS — M797 Fibromyalgia: Secondary | ICD-10-CM | POA: Diagnosis present

## 2022-11-13 DIAGNOSIS — E785 Hyperlipidemia, unspecified: Secondary | ICD-10-CM | POA: Diagnosis present

## 2022-11-13 DIAGNOSIS — I5033 Acute on chronic diastolic (congestive) heart failure: Secondary | ICD-10-CM | POA: Diagnosis present

## 2022-11-13 DIAGNOSIS — W010XXA Fall on same level from slipping, tripping and stumbling without subsequent striking against object, initial encounter: Secondary | ICD-10-CM | POA: Diagnosis present

## 2022-11-13 DIAGNOSIS — S8001XA Contusion of right knee, initial encounter: Secondary | ICD-10-CM | POA: Diagnosis not present

## 2022-11-13 DIAGNOSIS — I110 Hypertensive heart disease with heart failure: Secondary | ICD-10-CM | POA: Diagnosis not present

## 2022-11-13 DIAGNOSIS — M19041 Primary osteoarthritis, right hand: Secondary | ICD-10-CM | POA: Diagnosis present

## 2022-11-13 LAB — GLUCOSE, CAPILLARY
Glucose-Capillary: 66 mg/dL — ABNORMAL LOW (ref 70–99)
Glucose-Capillary: 68 mg/dL — ABNORMAL LOW (ref 70–99)
Glucose-Capillary: 113 mg/dL — ABNORMAL HIGH (ref 70–99)
Glucose-Capillary: 134 mg/dL — ABNORMAL HIGH (ref 70–99)

## 2022-11-13 LAB — TYPE AND SCREEN
ABO/RH(D): O POS
Antibody Screen: NEGATIVE

## 2022-11-13 LAB — COMPREHENSIVE METABOLIC PANEL
ALT: 25 U/L (ref 0–44)
AST: 34 U/L (ref 15–41)
Albumin: 3.9 g/dL (ref 3.5–5.0)
Alkaline Phosphatase: 52 U/L (ref 38–126)
Anion gap: 14 (ref 5–15)
BUN: 21 mg/dL (ref 8–23)
CO2: 26 mmol/L (ref 22–32)
Calcium: 9.1 mg/dL (ref 8.9–10.3)
Chloride: 93 mmol/L — ABNORMAL LOW (ref 98–111)
Creatinine, Ser: 1.82 mg/dL — ABNORMAL HIGH (ref 0.61–1.24)
GFR, Estimated: 39 mL/min — ABNORMAL LOW (ref >60)
Glucose, Bld: 88 mg/dL (ref 70–99)
Potassium: 4.4 mmol/L (ref 3.5–5.1)
Sodium: 133 mmol/L — ABNORMAL LOW (ref 135–145)
Total Bilirubin: 0.2 mg/dL — ABNORMAL LOW (ref 0.3–1.2)
Total Protein: 7.1 g/dL (ref 6.5–8.1)

## 2022-11-13 LAB — CBC
HCT: 38.1 % — ABNORMAL LOW (ref 39.0–52.0)
Hemoglobin: 11.8 g/dL — ABNORMAL LOW (ref 13.0–17.0)
MCH: 28.5 pg (ref 26.0–34.0)
MCHC: 31 g/dL (ref 30.0–36.0)
MCV: 92 fL (ref 80.0–100.0)
Platelets: 156 10*3/uL (ref 150–400)
RBC: 4.14 MIL/uL — ABNORMAL LOW (ref 4.22–5.81)
RDW: 14.9 % (ref 11.5–15.5)
WBC: 7.3 10*3/uL (ref 4.0–10.5)
nRBC: 0 % (ref 0.0–0.2)

## 2022-11-13 MED ORDER — INSULIN ASPART 100 UNIT/ML IJ SOLN
0.0000 [IU] | Freq: Three times a day (TID) | INTRAMUSCULAR | Status: DC
  Administered 2022-11-15: 1 [IU] via SUBCUTANEOUS
  Administered 2022-11-16: 2 [IU] via SUBCUTANEOUS
  Administered 2022-11-18: 1 [IU] via SUBCUTANEOUS

## 2022-11-13 MED ORDER — VANCOMYCIN HCL IN DEXTROSE 1-5 GM/200ML-% IV SOLN
1000.0000 mg | INTRAVENOUS | Status: DC
  Administered 2022-11-14 – 2022-11-17 (×4): 1000 mg via INTRAVENOUS
  Filled 2022-11-13 (×4): qty 200

## 2022-11-13 MED ORDER — SUCRALFATE 1 G PO TABS: 1.0000 g | ORAL_TABLET | Freq: Three times a day (TID) | ORAL | Status: DC | PRN

## 2022-11-13 MED ORDER — HYDROXYZINE HCL 10 MG PO TABS
10.0000 mg | ORAL_TABLET | Freq: Three times a day (TID) | ORAL | Status: DC | PRN
  Administered 2022-11-16: 10 mg via ORAL
  Filled 2022-11-13: qty 1

## 2022-11-13 MED ORDER — SODIUM CHLORIDE 0.9 % IV SOLN
2.0000 g | Freq: Once | INTRAVENOUS | Status: AC
  Administered 2022-11-13: 2 g via INTRAVENOUS
  Filled 2022-11-13: qty 12.5

## 2022-11-13 MED ORDER — POLYETHYLENE GLYCOL 3350 17 GM/SCOOP PO POWD: 17.0000 g | Freq: Every day | ORAL | Status: DC | PRN

## 2022-11-13 MED ORDER — UMECLIDINIUM BROMIDE 62.5 MCG/ACT IN AEPB
1.0000 | INHALATION_SPRAY | Freq: Every day | RESPIRATORY_TRACT | Status: DC
  Administered 2022-11-14 – 2022-11-18 (×4): 1 via RESPIRATORY_TRACT
  Filled 2022-11-13 (×2): qty 7

## 2022-11-13 MED ORDER — GABAPENTIN 300 MG PO CAPS
600.0000 mg | ORAL_CAPSULE | Freq: Three times a day (TID) | ORAL | Status: DC
  Administered 2022-11-13 – 2022-11-18 (×14): 600 mg via ORAL
  Filled 2022-11-13: qty 2
  Filled 2022-11-13: qty 6
  Filled 2022-11-13 (×13): qty 2

## 2022-11-13 MED ORDER — ROPINIROLE HCL 0.5 MG PO TABS
1.0000 mg | ORAL_TABLET | Freq: Three times a day (TID) | ORAL | Status: DC
  Administered 2022-11-13 – 2022-11-18 (×14): 1 mg via ORAL
  Filled 2022-11-13 (×14): qty 2

## 2022-11-13 MED ORDER — POLYETHYL GLYC-PROPYL GLYC PF 0.4-0.3 % OP SOLN: 1.0000 [drp] | Freq: Two times a day (BID) | OPHTHALMIC | Status: DC | PRN

## 2022-11-13 MED ORDER — SPIRONOLACTONE 12.5 MG HALF TABLET: 25.0000 mg | ORAL_TABLET | Freq: Every day | ORAL | Status: DC

## 2022-11-13 MED ORDER — HYDROMORPHONE HCL 1 MG/ML IJ SOLN
0.5000 mg | INTRAMUSCULAR | Status: DC | PRN
  Administered 2022-11-14 – 2022-11-16 (×4): 0.5 mg via INTRAVENOUS
  Filled 2022-11-13 (×4): qty 0.5

## 2022-11-13 MED ORDER — BISOPROLOL FUMARATE 5 MG PO TABS
5.0000 mg | ORAL_TABLET | Freq: Every day | ORAL | Status: DC
  Administered 2022-11-14 – 2022-11-18 (×3): 5 mg via ORAL
  Filled 2022-11-13 (×5): qty 1

## 2022-11-13 MED ORDER — METRONIDAZOLE 500 MG/100ML IV SOLN
500.0000 mg | Freq: Once | INTRAVENOUS | Status: AC
  Administered 2022-11-13: 500 mg via INTRAVENOUS
  Filled 2022-11-13 (×2): qty 100

## 2022-11-13 MED ORDER — FLUTICASONE FUROATE-VILANTEROL 200-25 MCG/ACT IN AEPB
1.0000 | INHALATION_SPRAY | Freq: Every day | RESPIRATORY_TRACT | Status: DC
  Administered 2022-11-14 – 2022-11-18 (×4): 1 via RESPIRATORY_TRACT
  Filled 2022-11-13 (×3): qty 28

## 2022-11-13 MED ORDER — POTASSIUM CHLORIDE CRYS ER 20 MEQ PO TBCR
20.0000 meq | EXTENDED_RELEASE_TABLET | ORAL | Status: DC
  Administered 2022-11-13 – 2022-11-17 (×6): 20 meq via ORAL
  Filled 2022-11-13 (×7): qty 1

## 2022-11-13 MED ORDER — ROSUVASTATIN CALCIUM 5 MG PO TABS
10.0000 mg | ORAL_TABLET | Freq: Every day | ORAL | Status: DC
  Administered 2022-11-13 – 2022-11-17 (×5): 10 mg via ORAL
  Filled 2022-11-13 (×5): qty 2

## 2022-11-13 MED ORDER — DIPHENHYDRAMINE HCL 25 MG PO CAPS
25.0000 mg | ORAL_CAPSULE | ORAL | Status: DC
  Administered 2022-11-14 – 2022-11-17 (×4): 25 mg via ORAL
  Filled 2022-11-13 (×5): qty 1

## 2022-11-13 MED ORDER — MENTHOL-CAMPHOR 11-11 % EX CREA: TOPICAL_CREAM | CUTANEOUS | Status: DC | PRN

## 2022-11-13 MED ORDER — PANTOPRAZOLE SODIUM 40 MG PO TBEC
40.0000 mg | DELAYED_RELEASE_TABLET | Freq: Every day | ORAL | Status: DC
  Administered 2022-11-14 – 2022-11-15 (×2): 40 mg via ORAL
  Filled 2022-11-13 (×3): qty 1

## 2022-11-13 MED ORDER — AMLODIPINE BESYLATE 5 MG PO TABS
5.0000 mg | ORAL_TABLET | Freq: Every morning | ORAL | Status: DC
  Administered 2022-11-14 – 2022-11-18 (×5): 5 mg via ORAL
  Filled 2022-11-13 (×5): qty 1

## 2022-11-13 MED ORDER — PREDNISONE 5 MG PO TABS
2.5000 mg | ORAL_TABLET | Freq: Every day | ORAL | Status: DC
  Administered 2022-11-14 – 2022-11-18 (×5): 2.5 mg via ORAL
  Filled 2022-11-13 (×6): qty 1

## 2022-11-13 MED ORDER — CEFAZOLIN SODIUM-DEXTROSE 2-4 GM/100ML-% IV SOLN
2.0000 g | Freq: Three times a day (TID) | INTRAVENOUS | Status: DC
  Administered 2022-11-13 – 2022-11-16 (×8): 2 g via INTRAVENOUS
  Filled 2022-11-13 (×9): qty 100

## 2022-11-13 MED ORDER — RISAQUAD PO CAPS
2.0000 | ORAL_CAPSULE | Freq: Three times a day (TID) | ORAL | Status: DC
  Administered 2022-11-13 – 2022-11-18 (×13): 2 via ORAL
  Filled 2022-11-13 (×13): qty 2

## 2022-11-13 MED ORDER — OXYCODONE HCL 5 MG PO TABS
5.0000 mg | ORAL_TABLET | Freq: Four times a day (QID) | ORAL | Status: DC | PRN
  Administered 2022-11-13 – 2022-11-16 (×6): 5 mg via ORAL
  Filled 2022-11-13 (×6): qty 1

## 2022-11-13 MED ORDER — POLYVINYL ALCOHOL 1.4 % OP SOLN: 1.0000 [drp] | Freq: Two times a day (BID) | OPHTHALMIC | Status: DC | PRN

## 2022-11-13 MED ORDER — LEVOTHYROXINE SODIUM 75 MCG PO TABS
175.0000 ug | ORAL_TABLET | Freq: Every day | ORAL | Status: DC
  Administered 2022-11-14 – 2022-11-18 (×5): 175 ug via ORAL
  Filled 2022-11-13 (×5): qty 1

## 2022-11-13 MED ORDER — TRAZODONE HCL 50 MG PO TABS
50.0000 mg | ORAL_TABLET | Freq: Every day | ORAL | Status: DC
  Administered 2022-11-13 – 2022-11-17 (×5): 50 mg via ORAL
  Filled 2022-11-13 (×5): qty 1

## 2022-11-13 MED ORDER — SENNOSIDES-DOCUSATE SODIUM 8.6-50 MG PO TABS
1.0000 | ORAL_TABLET | Freq: Two times a day (BID) | ORAL | Status: DC
  Administered 2022-11-13 – 2022-11-18 (×11): 1 via ORAL
  Filled 2022-11-13 (×11): qty 1

## 2022-11-13 MED ORDER — ONDANSETRON HCL 4 MG PO TABS
4.0000 mg | ORAL_TABLET | Freq: Three times a day (TID) | ORAL | Status: DC | PRN
  Administered 2022-11-14: 4 mg via ORAL
  Filled 2022-11-13: qty 1

## 2022-11-13 MED ORDER — DIPHENHYDRAMINE HCL 50 MG/ML IJ SOLN
50.0000 mg | Freq: Once | INTRAMUSCULAR | Status: AC
  Administered 2022-11-13: 50 mg via INTRAVENOUS
  Filled 2022-11-13: qty 1

## 2022-11-13 MED ORDER — HEPARIN SODIUM (PORCINE) 5000 UNIT/ML IJ SOLN
5000.0000 [IU] | Freq: Two times a day (BID) | INTRAMUSCULAR | Status: DC
  Administered 2022-11-13 – 2022-11-14 (×3): 5000 [IU] via SUBCUTANEOUS
  Filled 2022-11-13 (×3): qty 1

## 2022-11-13 MED ORDER — VANCOMYCIN HCL 1750 MG/350ML IV SOLN
1750.0000 mg | Freq: Once | INTRAVENOUS | Status: AC
  Administered 2022-11-13: 1750 mg via INTRAVENOUS
  Filled 2022-11-13: qty 350

## 2022-11-13 MED ORDER — DM-GUAIFENESIN ER 30-600 MG PO TB12
2.0000 | ORAL_TABLET | Freq: Every day | ORAL | Status: DC
  Administered 2022-11-14 – 2022-11-18 (×5): 2 via ORAL
  Filled 2022-11-13 (×3): qty 2
  Filled 2022-11-13 (×2): qty 1
  Filled 2022-11-13: qty 2

## 2022-11-13 MED ORDER — MORPHINE SULFATE (PF) 2 MG/ML IV SOLN
2.0000 mg | Freq: Once | INTRAVENOUS | Status: AC
  Administered 2022-11-13: 2 mg via INTRAVENOUS
  Filled 2022-11-13: qty 1

## 2022-11-13 MED ORDER — MUSCLE RUB 10-15 % EX CREA: TOPICAL_CREAM | CUTANEOUS | Status: DC | PRN

## 2022-11-13 MED ORDER — BUDESON-GLYCOPYRROL-FORMOTEROL 160-9-4.8 MCG/ACT IN AERO: 2.0000 | INHALATION_SPRAY | Freq: Two times a day (BID) | RESPIRATORY_TRACT | Status: DC

## 2022-11-13 MED ORDER — FUROSEMIDE 10 MG/ML IJ SOLN
20.0000 mg | Freq: Once | INTRAMUSCULAR | Status: AC
  Administered 2022-11-13: 20 mg via INTRAVENOUS
  Filled 2022-11-13: qty 2

## 2022-11-13 MED ORDER — ALBUTEROL SULFATE (2.5 MG/3ML) 0.083% IN NEBU: 2.5000 mg | INHALATION_SOLUTION | Freq: Four times a day (QID) | RESPIRATORY_TRACT | Status: DC | PRN

## 2022-11-13 NOTE — Progress Notes (Signed)
Pharmacy Antibiotic Note  [name] is a 72 y.o. male admitted on 11/13/2022 with  R BKA stump infection .  Pharmacy has been consulted for Cefazolin and Vancomycin dosing.  Patient received one-time doses of cefepime 2g IV, metronidazole 500mg  IV, and vancomycin 1750mg  IV in the ED  WBC 7.3, afebrile SCr 1.82 (baseline SCr ~1.4-1.5)  Plan: Initiate Cefazolin 2g IV q8h 8 hours after cefepime Start Vancomycin 1000mg  IV q24h (eAUC ~514)    > Goal AUC 400-550    > Check vancomycin levels at steady state  Monitor daily CBC, temp, SCr, and for clinical signs of improvement  F/u cultures and de-escalate antibiotics as able   Height: 5\' 11"  (180.3 cm) Weight: 74.8 kg (164 lb 14.5 oz) IBW/kg (Calculated) : 75.3  Temp (24hrs), Avg:97.8 F (36.6 C), Min:97.7 F (36.5 C), Max:97.9 F (36.6 C)  Recent Labs  Lab 11/13/22 1102  WBC 7.3  CREATININE 1.82*    Estimated Creatinine Clearance: 38.2 mL/min (A) (by C-G formula based on SCr of 1.82 mg/dL (H)).    Allergies  Allergen Reactions   Meloxicam Rash and Other (See Comments)    Red Man Syndrome, also   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 syndrome 09/02/15, resolved with diphenhydramine and slowing of rate    Antimicrobials this admission: Cefepime 7/17 x1 Metronidazole 7/17 x1 Vancomycin 7/17 >>  Cefazolin 7/17 >>   Dose adjustments this admission: N/A  Microbiology results: 7/17 MRSA PCR: sent  Thank you for allowing pharmacy to be a part of this patient's care.  Wilburn Cornelia, PharmD, BCPS Clinical Pharmacist 11/13/2022 3:54 PM   Please refer to [facility] for pharmacy phone number

## 2022-11-13 NOTE — ED Provider Notes (Signed)
[place] EMERGENCY DEPARTMENT AT [facility] Provider Note   CSN: [id number] Arrival date & time: 11/13/22  1050     History  Chief Complaint  Patient presents with   fall on thinners/level 2   Fall    [name] is a 72 y.o. male.  HPI 72 year old male history of asthma, A-fib, on Eliquis, CHF, recent revision of right BKA, hypertension, presents to the ED today via EMS with report of fall.  Patient reported to be using his walker and had a mechanical fall going into a barbershop.  Patient noted to have abrasion/contusion to head, abrasion to hand, and reported to have "open fracture" of right lower extremity.  Picture taken prehospital by EMS consistent with dehiscence of BKA with bone present.  Patient received fentanyl 100 mics prehospital.  Patient without focal complaints at this time.     Home Medications Prior to Admission medications   Medication Sig Start Date End Date Taking? Authorizing Provider  albuterol (PROVENTIL HFA;VENTOLIN HFA) 108 (90 Base) MCG/ACT inhaler Inhale 2 puffs into the lungs every 6 (six) hours as needed for wheezing or shortness of breath. 12/28/15  Yes Leslye Peer, MD  amLODipine (NORVASC) 5 MG tablet Take 5 mg by mouth in the morning.   Yes [provider]  apixaban (ELIQUIS) 5 MG TABS tablet Take 1 tablet (5 mg total) by mouth 2 (two) times daily. 08/21/22  Yes Graciella Freer, PA-C  bisoprolol (ZEBETA) 5 MG tablet Take 5 mg by mouth daily.   Yes [provider]  Budeson-Glycopyrrol-Formoterol (BREZTRI AEROSPHERE) 160-9-4.8 MCG/ACT AERO Inhale 2 puffs into the lungs in the morning and at bedtime. 05/23/22  Yes Leslye Peer, MD  Cholecalciferol (VITAMIN D) 50 MCG (2000 UT) tablet Take 8,000 Units by mouth daily.   Yes [provider]  Dextromethorphan-guaiFENesin (MUCINEX DM MAXIMUM STRENGTH) 60-1200 MG TB12 Take 1 tablet by mouth daily.   Yes [provider]  doxycycline (VIBRA-TABS)  100 MG tablet Take 1 tablet (100 mg total) by mouth 2 (two) times daily. 11/12/22  Yes [name], MD  empagliflozin (JARDIANCE) 10 MG TABS tablet Take 1 tablet (10 mg total) by mouth daily. 05/27/22  Yes Bensimhon, Bevelyn Buckles, MD  gabapentin (NEURONTIN) 300 MG capsule Take 2 capsules (600 mg total) by mouth 3 (three) times daily. for neuropathy 10/19/22  Yes Rai, Ripudeep K, MD  hydrOXYzine (ATARAX) 10 MG tablet Take 1 tablet (10 mg total) by mouth 3 (three) times daily as needed. Patient taking differently: Take 10 mg by mouth 3 (three) times daily as needed for itching. 10/23/22  Yes Barnie Del R, NP  levothyroxine (SYNTHROID, LEVOTHROID) 175 MCG tablet Take 175 mcg by mouth daily before breakfast.   Yes [provider]  Menthol-Camphor (TIGER BALM ARTHRITIS RUB EX) Apply 1 application topically as needed (for leg pain).   Yes [provider]  montelukast (SINGULAIR) 10 MG tablet TAKE 1 TABLET(10 MG) BY MOUTH AT BEDTIME Patient taking differently: Take 10 mg by mouth daily. 06/27/22  Yes Leslye Peer, MD  Multiple Minerals-Vitamins (CAL-MAG-ZINC-D) TABS Take 1 tablet by mouth daily.   Yes [provider]  Multiple Vitamin (MULTIVITAMIN WITH MINERALS) TABS tablet Take 1 tablet by mouth daily. 12/15/19  Yes Sheikh, Omair Latif, DO  ondansetron (ZOFRAN) 4 MG tablet Take 1 tablet (4 mg total) by mouth every 8 (eight) hours as needed for nausea or vomiting. 10/19/22  Yes Rai, Delene Ruffini, MD  oxyCODONE-acetaminophen (PERCOCET/ROXICET)  5-325 MG tablet Take 1 tablet by mouth every 6 (six) hours as needed. Patient taking differently: Take 1 tablet by mouth every 6 (six) hours as needed for moderate pain. 11/01/22  Yes Barnie Del R, NP  OXYGEN Inhale 2 L/min into the lungs See admin instructions. 2 L/min at bedtime and 2 L/min throughout the day as needed for shortness of breath   Yes [provider]  pantoprazole (PROTONIX) 40 MG tablet Take 40 mg by mouth daily before  breakfast.   Yes [provider]  Polyethyl Glyc-Propyl Glyc PF (SYSTANE ULTRA PF) 0.4-0.3 % SOLN Place 1 drop into both eyes 2 (two) times daily as needed (dry eyes).   Yes [provider]  polyethylene glycol powder (GLYCOLAX/MIRALAX) 17 GM/SCOOP powder Take 17 g by mouth daily as needed for mild constipation. Patient taking differently: Take 17 g by mouth daily. 01/23/20  Yes Glade Lloyd, MD  potassium chloride SA (KLOR-CON M) 20 MEQ tablet Take 20 mEq by mouth See admin instructions. Take 20 meq twice daily every other day   Yes [provider]  predniSONE (DELTASONE) 5 MG tablet Take 0.5 tablets (2.5 mg total) by mouth daily with breakfast. 10/22/21  Yes Byrum, Les Pou, MD  rOPINIRole (REQUIP) 1 MG tablet Take 1 tablet (1 mg total) by mouth in the morning and at bedtime. 01/23/20  Yes Glade Lloyd, MD  rosuvastatin (CRESTOR) 10 MG tablet Take 10 mg by mouth at bedtime.  08/08/16  Yes [provider]  senna-docusate (SENOKOT-S) 8.6-50 MG tablet Take 1 tablet by mouth 2 (two) times daily.   Yes [provider]  spironolactone (ALDACTONE) 25 MG tablet Take 1 tablet (25 mg total) by mouth daily. 02/16/16  Yes Graciella Freer, PA-C  sucralfate (CARAFATE) 1 g tablet Take 1 g by mouth 3 (three) times daily as needed (Acid reflux). 03/27/22  Yes [provider]  torsemide (DEMADEX) 10 MG tablet Take 15 mg by mouth every other day.   Yes [provider]  traZODone (DESYREL) 50 MG tablet Take 50 mg by mouth at bedtime. 08/30/20  Yes [provider]      Allergies    Meloxicam and Vancomycin    Review of Systems   Review of Systems  Physical Exam Updated Vital Signs BP (!) 141/81 (BP Location: Left Wrist)   Pulse (!) 59   Temp 98.7 F (37.1 C) (Oral)   Resp 17   Ht 1.803 m (5\' 11" )   Wt 74.8 kg   SpO2 96%   BMI 23.00 kg/m  Physical Exam Vitals reviewed.  HENT:     Head:     Comments: Contusion to the  occiput with some bleeding    Right Ear: External ear normal.     Left Ear: External ear normal.     Nose: Nose normal.     Mouth/Throat:     Pharynx: Oropharynx is clear.  Eyes:     Pupils: Pupils are equal, round, and reactive to light.  Neck:     Comments: Cervical collar in place no obvious external signs of trauma to neck Cardiovascular:     Rate and Rhythm: Normal rate and regular rhythm.  Pulmonary:     Effort: Pulmonary effort is normal.     Breath sounds: Normal breath sounds.  Abdominal:     General: Abdomen is flat. Bowel sounds are normal.     Palpations: Abdomen is soft.  Musculoskeletal:     Comments: Right lower extremity  with previous BKA Bony exposure is noted with some diffuse oozing It appears that previous suture line has dehisced and there is possibly new soft tissue injury Proximal to injury knee flexion extension appears to be intact no other injuries are noted of extremities  Skin:    General: Skin is warm and dry.     Capillary Refill: Capillary refill takes less than 2 seconds.  Neurological:     General: No focal deficit present.     Mental Status: He is alert.     Cranial Nerves: No cranial nerve deficit.     Motor: No weakness.  Psychiatric:        Mood and Affect: Mood normal.        Behavior: Behavior normal.     ED Results / Procedures / Treatments   Labs (all labs ordered are listed, but only abnormal results are displayed) Labs Reviewed  CBC - Abnormal; Notable for the following components:      Result Value   RBC 4.14 (*)    Hemoglobin 11.8 (*)    HCT 38.1 (*)    All other components within normal limits  COMPREHENSIVE METABOLIC PANEL - Abnormal; Notable for the following components:   Sodium 133 (*)    Chloride 93 (*)    Creatinine, Ser 1.82 (*)    Total Bilirubin 0.2 (*)    GFR, Estimated 39 (*)    All other components within normal limits  HEMOGLOBIN A1C - Abnormal; Notable for the following components:   Hgb A1c MFr Bld 6.3  (*)    All other components within normal limits  GLUCOSE, CAPILLARY - Abnormal; Notable for the following components:   Glucose-Capillary 66 (*)    All other components within normal limits  BASIC METABOLIC PANEL - Abnormal; Notable for the following components:   Sodium 132 (*)    Chloride 93 (*)    Glucose, Bld 102 (*)    Creatinine, Ser 1.58 (*)    GFR, Estimated 46 (*)    All other components within normal limits  GLUCOSE, CAPILLARY - Abnormal; Notable for the following components:   Glucose-Capillary 68 (*)    All other components within normal limits  GLUCOSE, CAPILLARY - Abnormal; Notable for the following components:   Glucose-Capillary 113 (*)    All other components within normal limits  GLUCOSE, CAPILLARY - Abnormal; Notable for the following components:   Glucose-Capillary 134 (*)    All other components within normal limits  MRSA NEXT GEN BY PCR, NASAL  CBC  TYPE AND SCREEN    EKG None  Radiology DG Chest 1 View  Result Date: 11/14/2022 CLINICAL DATA:  CHF. EXAM: CHEST  1 VIEW COMPARISON:  11/13/2022 FINDINGS: The cardiopericardial silhouette is within normal limits for size. Mild vascular congestion without overt airspace pulmonary edema. No pleural effusion or focal airspace consolidation. Left-sided permanent pacemaker again noted. No acute bony abnormality. IMPRESSION: Mild vascular congestion without overt airspace pulmonary edema. Electronically Signed   By: Kennith Center M.D.   On: 11/14/2022 06:07   CT Cervical Spine Wo Contrast  Result Date: 11/13/2022 CLINICAL DATA:  Trauma, pain EXAM: CT CERVICAL SPINE WITHOUT CONTRAST TECHNIQUE: Multidetector CT imaging of the cervical spine was performed without intravenous contrast. Multiplanar CT image reconstructions were also generated. RADIATION DOSE REDUCTION: This exam was performed according to the departmental dose-optimization program which includes automated exposure control, adjustment of the mA and/or kV  according to patient size and/or use of iterative reconstruction technique. COMPARISON:  05/13/2022 FINDINGS: Alignment: Alignment of posterior margins of vertebral bodies is within normal limits. There is minimal dextroscoliosis. Skull base and vertebrae: No recent fracture is seen. Degenerative changes are noted, more so at C4-C5 and C5-C6 levels. Soft tissues and spinal canal: There is no central spinal stenosis. Disc levels: There is moderate encroachment of left neural foramen at C2-C3 level. There is mild to moderate encroachment of left neural foramen at C3-C4 level. There is moderate to marked encroachment of neural foramina at C4-C5 and C5-C6 levels. Upper chest: Unremarkable. Other: Thyroid is difficult to visualize. IMPRESSION: No recent fracture is seen in cervical spine. Cervical spondylosis with encroachment of neural foramina at multiple levels as described in the body of the report. Electronically Signed   By: Ernie Avena M.D.   On: 11/13/2022 12:36   CT Head Wo Contrast  Result Date: 11/13/2022 CLINICAL DATA:  Trauma, fall EXAM: CT HEAD WITHOUT CONTRAST TECHNIQUE: Contiguous axial images were obtained from the base of the skull through the vertex without intravenous contrast. RADIATION DOSE REDUCTION: This exam was performed according to the departmental dose-optimization program which includes automated exposure control, adjustment of the mA and/or kV according to patient size and/or use of iterative reconstruction technique. COMPARISON:  10/17/2022 FINDINGS: Brain: No acute intracranial findings are seen. There are no signs of bleeding within the cranium. Cortical sulci are prominent. Vascular: Unremarkable. Skull: No fracture is seen in calvarium. Sinuses/Orbits: There is a mucous retention cyst in right maxillary sinus. There is mild medial bulging of inferior aspect of medial wall of left orbit, possibly residual change from remote injury. Other: None. IMPRESSION: No acute  intracranial findings are seen in noncontrast CT brain. Atrophy. Electronically Signed   By: Ernie Avena M.D.   On: 11/13/2022 12:30   DG Knee 2 Views Right  Result Date: 11/13/2022 CLINICAL DATA:  Trauma, pain EXAM: RIGHT KNEE - 1-2 VIEW COMPARISON:  10/17/2022 FINDINGS: The previous below-knee amputation in right lower leg. No recent fracture or dislocation is seen. No focal erosive changes are noted. Minimal bony spurs are seen in better alignment. There is no significant effusion. Arterial calcifications are seen in soft tissues. IMPRESSION: Previous below-knee amputation in right lower leg. No recent fracture or dislocation is seen in right knee. Arteriosclerosis. Electronically Signed   By: Ernie Avena M.D.   On: 11/13/2022 12:27   DG Hand Complete Right  Result Date: 11/13/2022 CLINICAL DATA:  Trauma, fall EXAM: RIGHT HAND - COMPLETE 3+ VIEW COMPARISON:  None Available. FINDINGS: Monitoring devices is partly obscuring the middle and distal phalanges of index finger. No recent fracture or dislocation is seen. Degenerative changes are noted with bony spurs and multiple interphalangeal joints and metacarpophalangeal joints. Bony spurs are seen in first carpometacarpal joint. Deformity in the distal ulna may be residual from previous injury. Degenerative changes are noted in the inferior radioulnar joint. There is a thin linear radiopacity in the soft tissues in the tip of right thumb. IMPRESSION: No fracture or dislocation is seen. Degenerative changes are noted in multiple joints. There is small thin linear radiopacity in the soft tissues along the palmar aspect of tip of distal phalanx right thumb suggesting foreign body in the soft tissues. Electronically Signed   By: Ernie Avena M.D.   On: 11/13/2022 12:25   DG Pelvis Portable  Result Date: 11/13/2022 CLINICAL DATA:  Trauma, fall EXAM: PORTABLE PELVIS 1-2 VIEWS COMPARISON:  None Available. FINDINGS: There is no evidence of  displaced pelvic fracture or diastasis.  No pelvic bone lesions are seen. Nonobstructive pattern of overlying bowel gas. Large burden of stool. IMPRESSION: No displaced fracture of the pelvis or bilateral proximal femurs in single frontal view. Electronically Signed   By: Jearld Lesch M.D.   On: 11/13/2022 12:15   DG Chest Port 1 View  Result Date: 11/13/2022 CLINICAL DATA:  trauma EXAM: PORTABLE CHEST 1 VIEW COMPARISON:  CXR 10/17/22 FINDINGS: Left-sided dual lead cardiac device in place with unchanged lead positioning. Status post median sternotomy and aortic valve repair. Unchanged cardiac and mediastinal contours. No pleural effusion. No pneumothorax. No radiographically apparent displaced rib fractures. Visualized upper abdomen is unremarkable. Prominent bilateral interstitial opacities that could represent pulmonary venous congestion. IMPRESSION: Prominent bilateral interstitial opacities that could represent pulmonary venous congestion. Electronically Signed   By: Lorenza Cambridge M.D.   On: 11/13/2022 12:08    Procedures Procedures    Medications Ordered in ED Medications  HYDROmorphone (DILAUDID) injection 0.5 mg (has no administration in time range)  oxyCODONE (Oxy IR/ROXICODONE) immediate release tablet 5 mg (5 mg Oral Given 11/14/22 0611)  amLODipine (NORVASC) tablet 5 mg (has no administration in time range)  bisoprolol (ZEBETA) tablet 5 mg (has no administration in time range)  rosuvastatin (CRESTOR) tablet 10 mg (10 mg Oral Given 11/13/22 2144)  hydrOXYzine (ATARAX) tablet 10 mg (has no administration in time range)  traZODone (DESYREL) tablet 50 mg (50 mg Oral Given 11/13/22 2145)  levothyroxine (SYNTHROID) tablet 175 mcg (175 mcg Oral Given 11/14/22 0611)  predniSONE (DELTASONE) tablet 2.5 mg (has no administration in time range)  ondansetron (ZOFRAN) tablet 4 mg (has no administration in time range)  pantoprazole (PROTONIX) EC tablet 40 mg (has no administration in time range)   polyethylene glycol powder (GLYCOLAX/MIRALAX) container 17 g (has no administration in time range)  senna-docusate (Senokot-S) tablet 1 tablet (1 tablet Oral Given 11/13/22 2144)  sucralfate (CARAFATE) tablet 1 g (has no administration in time range)  gabapentin (NEURONTIN) capsule 600 mg (600 mg Oral Given 11/13/22 2144)  rOPINIRole (REQUIP) tablet 1 mg (1 mg Oral Given 11/13/22 2143)  potassium chloride SA (KLOR-CON M) CR tablet 20 mEq (20 mEq Oral Given 11/13/22 2143)  albuterol (PROVENTIL) (2.5 MG/3ML) 0.083% nebulizer solution 2.5 mg (has no administration in time range)  dextromethorphan-guaiFENesin (MUCINEX DM) 30-600 MG per 12 hr tablet 2 tablet (has no administration in time range)  insulin aspart (novoLOG) injection 0-9 Units ( Subcutaneous Not Given 11/13/22 1635)  heparin injection 5,000 Units (5,000 Units Subcutaneous Given 11/13/22 2144)  acidophilus (RISAQUAD) capsule 2 capsule (2 capsules Oral Given 11/13/22 2143)  ceFAZolin (ANCEF) IVPB 2g/100 mL premix (2 g Intravenous New Bag/Given 11/14/22 0612)  diphenhydrAMINE (BENADRYL) capsule 25 mg (has no administration in time range)    And  vancomycin (VANCOCIN) IVPB 1000 mg/200 mL premix (has no administration in time range)  fluticasone furoate-vilanterol (BREO ELLIPTA) 200-25 MCG/ACT 1 puff (1 puff Inhalation Not Given 11/13/22 1946)    And  umeclidinium bromide (INCRUSE ELLIPTA) 62.5 MCG/ACT 1 puff (1 puff Inhalation Not Given 11/13/22 1947)  Muscle Rub CREA (has no administration in time range)  polyvinyl alcohol (LIQUIFILM TEARS) 1.4 % ophthalmic solution 1 drop (has no administration in time range)  morphine (PF) 2 MG/ML injection 2 mg (2 mg Intravenous Given 11/13/22 1200)  morphine (PF) 2 MG/ML injection 2 mg (2 mg Intravenous Given 11/13/22 1440)  diphenhydrAMINE (BENADRYL) injection 50 mg (50 mg Intravenous Given 11/13/22 1438)  vancomycin (VANCOREADY) IVPB 1750 mg/350 mL (0 mg Intravenous  Stopped 11/13/22 2144)  ceFEPIme (MAXIPIME)  2 g in sodium chloride 0.9 % 100 mL IVPB (0 g Intravenous Stopped 11/13/22 1554)  metroNIDAZOLE (FLAGYL) IVPB 500 mg (0 mg Intravenous Stopped 11/13/22 2004)  furosemide (LASIX) injection 20 mg (20 mg Intravenous Given 11/13/22 1613)    ED Course/ Medical Decision Making/ A&P Clinical Course as of 11/14/22 0658  Wed Nov 13, 2022  1248 Cervical spine x-[name] reviewed interpreted and no evidence of acute fracture is noted [DR]  1248 CT head reviewed interpreted no evidence of acute intracranial abnormalities are noted on the noncontrast head CT [DR]  1249 Right hand x-[name] reviewed and interpreted no evidence of acute fractures were noted on my interpretation and radiologist interpretation notes possible foreign body in the thumb this does not clinically correlate [DR]  1249 Plan pelvis x-[name] reviewed interpreted no evidence of acute fracture [DR]  1249 No acute abnormality noted on knee x-[name] on right [DR]    Clinical Course User Index [DR] Margarita Grizzle, MD                             Medical Decision Making Amount and/or Complexity of Data Reviewed Labs: ordered. Radiology: ordered.  Risk Prescription drug management. Decision regarding hospitalization.   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  Patient was evaluated here in the ED after fall with head CT, Cervical spine CT, right hand x-[name], and x-[name] of right stump.  No evidence of acute fracture of cervical spine or hand.  No evidence of acute intracranial bleeding.  Patient with dehiscence of wound on right lower extremity with open bone noted.  Reviewed and discussed antibiotics with ED pharmacist.  Plan IV vancomycin.  Patient has some history of allergic reaction which has been treatable with diphenhydramine and and he is being pretreated with diphenhydramine here in the ED. Here in the ED he has been  hemodynamically stable Injury with dehiscence and exposure of right BKA Patient on chronic anticoagulation Labs significant for chronic anemia which is improved from first prior Elevated creatinine at 1.82 which is stable from 1 month ago  1- fall - appears mechanical 2- head contusion- no evidence of ich 3- right hand abrasion 4- right lower extremity bka wound dehiscence.  IV abx ordered, d.w. Dr. Lajoyce Corners.  Plan repair on Friday.  Hold eliquis. Requests consult to hospitalist for admission-care discussed with Dr. Chipper Herb who will see for admission       Final Clinical Impression(s) / ED Diagnoses Final diagnoses:  None    Rx / DC Orders ED Discharge Orders     None         Margarita Grizzle, MD 11/14/22 [phone]

## 2022-11-13 NOTE — Progress Notes (Signed)
Orthopedic Tech Progress Note Patient Details:  [name] Sep 22, 1950 [id number] Level 2 Trauma  Patient ID: [name], male   DOB: 1950-06-26, 72 y.o.   MRN: [id number]  [name] 11/13/2022, 11:15 AM

## 2022-11-13 NOTE — ED Notes (Signed)
ED TO INPATIENT HANDOFF REPORT  ED Nurse Name and Phone #: Vernona Rieger [phone]  S Name/Age/Gender [name] 72 y.o. male Room/Bed: 024C/024C  Code Status   Code Status: DNR  Home/SNF/Other Home Patient oriented to: self, place, time, and situation Is this baseline? Yes   Triage Complete: Triage complete  Chief Complaint Osteomyelitis [facility]) [M86.9]  Triage Note Per EMS and pt report, pt was ambulating with his walker when his walker caught and "bounced back at him". Pt fell and hit the back of his head, has a skin tear to his L elbow, and his R BKA healing wound dehisced. Pressure dressings applied to R leg and L elbow. C-collar in place. Pt had a recent BKA and revision 09/04/2022. Pt is on Eliquis. Reports his pain 7/10.   Allergies Allergies  Allergen Reactions   Meloxicam Rash and Other (See Comments)    Red Man Syndrome, also   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 syndrome 09/02/15, resolved with diphenhydramine and slowing of rate    Level of Care/Admitting Diagnosis ED Disposition     ED Disposition  Admit   Condition  --   Comment  Hospital Area: [facility] [[id number]]  Level of Care: Med-Surg [16]  May admit patient to [facility] or [facility] if equivalent level of care is available:: No  Covid Evaluation: Asymptomatic - no recent exposure (last 10 days) testing not required  Diagnosis: Osteomyelitis [facility]) [[id number]]  Admitting Physician: Emeline General [[id number]]  Attending Physician: Emeline General [[id number]]  Certification:: I certify this patient will need inpatient services for at least 2 midnights  Estimated Length of Stay: 2          B Medical/Surgery History Past Medical History:  Diagnosis Date   Aortic stenosis, mild    Arthritis of hand    "just a little bit in both hands" (03/31/2012)   Asthma    "little bit" (03/31/2012)   Atrial fibrillation (HCC)    dx '04; DCCV '04, placed on flecainide, failed DCCV 04/2010,  flecainide stopped; s/p successful A.fib ablation 01/31/12   CHF (congestive heart failure) (HCC)    Controlled type 2 diabetes mellitus without complication, without long-term current use of insulin (HCC) 12/13/2019   COPD (chronic obstructive pulmonary disease) (HCC)    DJD (degenerative joint disease)    Fatty liver    Fibromyalgia    GERD (gastroesophageal reflux disease)    Hyperlipidemia    Hypertension    Hypothyroidism    S/P radiation   Left atrial enlargement    LA size 45mm by echo 11/21/11   Mitral regurgitation    trivial   Obstructive sleep apnea    mild by sleep study 2013; pt stated he does not have a machine because "it wasn't bad enough for him to have one"   Pacemaker 03/31/2012   Pneumonia    last time 2023   Restless leg syndrome    Second degree AV block    Splenomegaly    Past Surgical History:  Procedure Laterality Date   AMPUTATION Right 08/23/2022   Procedure: RIGHT FOOT FIFTH RAY AMPUTATION;  Surgeon: [name], MD;  Location: [facility] OR;  Service: Orthopedics;  Laterality: Right;   AMPUTATION Right 09/04/2022   Procedure: RIGHT BELOW KNEE AMPUTATION;  Surgeon: [name], MD;  Location: [facility] OR;  Service: Orthopedics;  Laterality: Right;   AORTIC VALVE REPLACEMENT N/A 08/22/2015   Procedure: AORTIC VALVE REPLACEMENT (AVR);  Surgeon: Kerin Perna, MD;  Location: [facility] OR;  Service: Open Heart Surgery;  Laterality: N/A;   ATRIAL FIBRILLATION ABLATION  01/30/2012   PVI by Dr. Johney Frame   ATRIAL FIBRILLATION ABLATION N/A 01/31/2012   Procedure: ATRIAL FIBRILLATION ABLATION;  Surgeon: [name], MD;  Location: [facility] CATH LAB;  Service: Cardiovascular;  Laterality: N/A;   ATRIAL FIBRILLATION ABLATION N/A 04/19/2020   Procedure: ATRIAL FIBRILLATION ABLATION;  Surgeon: [name], MD;  Location: [facility] INVASIVE CV LAB;  Service: Cardiovascular;  Laterality: N/A;   BACK SURGERY     X 3   BIOPSY  01/01/2022   Procedure: BIOPSY;  Surgeon: Kathi Der, MD;   Location: [facility] ENDOSCOPY;  Service: Gastroenterology;;   CARDIAC CATHETERIZATION N/A 08/09/2015   Procedure: Right/Left Heart Cath and Coronary Angiography;  Surgeon: Peter M Swaziland, MD;  Location: [facility] INVASIVE CV LAB;  Service: Cardiovascular;  Laterality: N/A;   CARDIAC CATHETERIZATION N/A 02/05/2016   Procedure: Right/Left Heart Cath and Coronary Angiography;  Surgeon: Corky Crafts, MD;  Location: [facility] INVASIVE CV LAB;  Service: Cardiovascular;  Laterality: N/A;   CARDIAC CATHETERIZATION N/A 04/17/2016   Procedure: Right Heart Cath;  Surgeon: Dolores Patty, MD;  Location: [facility] INVASIVE CV LAB;  Service: Cardiovascular;  Laterality: N/A;   CARDIOVASCULAR STRESS TEST  03/12/2002   EF 48%, NO EVIDENCE OF ISCHEMIA   CARDIOVERSION  01/2012; 03/31/2012   CARDIOVERSION N/A 02/25/2012   Procedure: CARDIOVERSION;  Surgeon: [name], MD;  Location: [facility] CATH LAB;  Service: Cardiovascular;  Laterality: N/A;   CARDIOVERSION N/A 03/31/2012   Procedure: CARDIOVERSION;  Surgeon: [name], MD;  Location: [facility] CATH LAB;  Service: Cardiovascular;  Laterality: N/A;   CARDIOVERSION N/A 11/23/2015   Procedure: CARDIOVERSION;  Surgeon: Laurey Morale, MD;  Location: [facility] ENDOSCOPY;  Service: Cardiovascular;  Laterality: N/A;   CARDIOVERSION N/A 04/08/2016   Procedure: CARDIOVERSION;  Surgeon: Dolores Patty, MD;  Location: [facility] ENDOSCOPY;  Service: Cardiovascular;  Laterality: N/A;   CARDIOVERSION N/A 09/14/2018   Procedure: CARDIOVERSION;  Surgeon: Wendall Stade, MD;  Location: [facility] ENDOSCOPY;  Service: Cardiovascular;  Laterality: N/A;   CARDIOVERSION N/A 10/19/2019   Procedure: CARDIOVERSION;  Surgeon: Dolores Patty, MD;  Location: [facility] ENDOSCOPY;  Service: Cardiovascular;  Laterality: N/A;   CARDIOVERSION N/A 02/25/2020   Procedure: CARDIOVERSION;  Surgeon: Pricilla Riffle, MD;  Location: [facility] ENDOSCOPY;  Service: Cardiovascular;  Laterality: N/A;   CARDIOVERSION N/A 05/25/2020   Procedure:  CARDIOVERSION;  Surgeon: Lewayne Bunting, MD;  Location: [facility] ENDOSCOPY;  Service: Cardiovascular;  Laterality: N/A;   CARDIOVERSION N/A 07/10/2020   Procedure: CARDIOVERSION;  Surgeon: Jake Bathe, MD;  Location: [facility] ENDOSCOPY;  Service: Cardiovascular;  Laterality: N/A;   CLIPPING OF ATRIAL APPENDAGE N/A 08/22/2015   Procedure: CLIPPING OF ATRIAL APPENDAGE;  Surgeon: Kerin Perna, MD;  Location: [facility] OR;  Service: Open Heart Surgery;  Laterality: N/A;   COLONOSCOPY WITH PROPOFOL N/A 01/01/2022   Procedure: COLONOSCOPY WITH PROPOFOL;  Surgeon: Kathi Der, MD;  Location: [facility] ENDOSCOPY;  Service: Gastroenterology;  Laterality: N/A;   DOPPLER ECHOCARDIOGRAPHY  03/11/2002   EF 70-75%   FINGER SURGERY Left    Middle finger   FINGER TENDON REPAIR  1980's   "right little finger" (03/31/2012)   FLEXIBLE SIGMOIDOSCOPY N/A 12/30/2021   Procedure: FLEXIBLE SIGMOIDOSCOPY;  Surgeon: Charlott Rakes, MD;  Location: [facility] ENDOSCOPY;  Service: Gastroenterology;  Laterality: N/A;   INSERT / REPLACE / REMOVE PACEMAKER     [employer] Jude  IR RADIOLOGIST EVAL & MGMT  05/06/2019   IR RADIOLOGIST EVAL & MGMT  10/06/2019   IR RADIOLOGIST EVAL & MGMT  10/31/2020   IR RADIOLOGIST EVAL & MGMT  01/18/2021   IR RADIOLOGIST EVAL & MGMT  03/08/2021   IR RADIOLOGIST EVAL & MGMT  06/13/2021   KIDNEY SURGERY Left    LEFT AND RIGHT HEART CATHETERIZATION WITH CORONARY ANGIOGRAM N/A 10/27/2012   Procedure: LEFT AND RIGHT HEART CATHETERIZATION WITH CORONARY ANGIOGRAM;  Surgeon: Pamella Pert, MD;  Location: [facility] CATH LAB;  Service: Cardiovascular;  Laterality: N/A;   LUMBAR DISC SURGERY  1980's X2;  2000's   MAZE N/A 08/22/2015   Procedure: MAZE;  Surgeon: Kerin Perna, MD;  Location: [facility] OR;  Service: Open Heart Surgery;  Laterality: N/A;   PACEMAKER INSERTION  03/31/2012   STJ Accent DR pacemaker implanted by Dr Johney Frame   PERMANENT PACEMAKER INSERTION N/A 03/31/2012   Procedure: PERMANENT PACEMAKER INSERTION;  Surgeon:  [name], MD;  Location: [facility] CATH LAB;  Service: Cardiovascular;  Laterality: N/A;   PPM GENERATOR CHANGEOUT N/A 08/08/2020   Procedure: PPM GENERATOR CHANGEOUT;  Surgeon: [name], MD;  Location: [facility] INVASIVE CV LAB;  Service: Cardiovascular;  Laterality: N/A;   RADIOLOGY WITH ANESTHESIA Left 02/14/2021   Procedure: RADIOLOGY WITH ANESTHESIA LEFT RENAL CRYOABLATION;  Surgeon: Irish Lack, MD;  Location: [facility] ORS;  Service: Radiology;  Laterality: Left;   STUMP REVISION Right 10/09/2022   Procedure: REVISION RIGHT BELOW KNEE AMPUTATION;  Surgeon: [name], MD;  Location: [facility] OR;  Service: Orthopedics;  Laterality: Right;   TEE WITHOUT CARDIOVERSION  01/30/2012   Procedure: TRANSESOPHAGEAL ECHOCARDIOGRAM (TEE);  Surgeon: Laurey Morale, MD;  Location: [facility] ENDOSCOPY;  Service: Cardiovascular;  Laterality: N/A;  ablation next day   TEE WITHOUT CARDIOVERSION N/A 08/22/2015   Procedure: TRANSESOPHAGEAL ECHOCARDIOGRAM (TEE);  Surgeon: Kerin Perna, MD;  Location: [facility] OR;  Service: Open Heart Surgery;  Laterality: N/A;   TEE WITHOUT CARDIOVERSION N/A 10/19/2019   Procedure: TRANSESOPHAGEAL ECHOCARDIOGRAM (TEE);  Surgeon: Dolores Patty, MD;  Location: [facility] ENDOSCOPY;  Service: Cardiovascular;  Laterality: N/A;   US ECHOCARDIOGRAPHY  08/07/2009   EF 55-60%     A IV Location/Drains/Wounds Patient Lines/Drains/Airways Status     Active Line/Drains/Airways     Name Placement date Placement time Site Days   Peripheral IV 11/13/22 20 G Anterior;Distal;Right;Upper Arm 11/13/22  1045  Arm  less than 1   Negative Pressure Wound Therapy Leg Left 10/09/22  1154  --  35            Intake/Output Last 24 hours No intake or output data in the 24 hours ending 11/13/22 1503  Labs/Imaging Results for orders placed or performed during the hospital encounter of 11/13/22 (from the past 48 hour(s))  CBC     Status: Abnormal   Collection Time: 11/13/22 11:02 AM  Result Value Ref Range    WBC 7.3 4.0 - 10.5 K/uL   RBC 4.14 (L) 4.22 - 5.81 MIL/uL   Hemoglobin 11.8 (L) 13.0 - 17.0 g/dL   HCT 16.1 (L) 09.6 - 04.5 %   MCV 92.0 80.0 - 100.0 fL   MCH 28.5 26.0 - 34.0 pg   MCHC 31.0 30.0 - 36.0 g/dL   RDW 40.9 81.1 - 91.4 %   Platelets 156 150 - 400 K/uL   nRBC 0.0 0.0 - 0.2 %    Comment: Performed at [facility], [address] [place],  [place] [address]  Comprehensive metabolic panel     Status: Abnormal   Collection Time: 11/13/22 11:02 AM  Result Value Ref Range   Sodium 133 (L) 135 - 145 mmol/L   Potassium 4.4 3.5 - 5.1 mmol/L   Chloride 93 (L) 98 - 111 mmol/L   CO2 26 22 - 32 mmol/L   Glucose, Bld 88 70 - 99 mg/dL    Comment: Glucose reference range applies only to samples taken after fasting for at least 8 hours.   BUN 21 8 - 23 mg/dL   Creatinine, Ser 3.66 (H) 0.61 - 1.24 mg/dL   Calcium 9.1 8.9 - 44.0 mg/dL   Total Protein 7.1 6.5 - 8.1 g/dL   Albumin 3.9 3.5 - 5.0 g/dL   AST 34 15 - 41 U/L   ALT 25 0 - 44 U/L   Alkaline Phosphatase 52 38 - 126 U/L   Total Bilirubin 0.2 (L) 0.3 - 1.2 mg/dL   GFR, Estimated 39 (L) >60 mL/min    Comment: (NOTE) Calculated using the CKD-EPI Creatinine Equation (2021)    Anion gap 14 5 - 15    Comment: Performed at [facility], [address], [place], [place] [address]  Type and screen [facility]     Status: None   Collection Time: 11/13/22 11:20 AM  Result Value Ref Range   ABO/RH(D) O POS    Antibody Screen NEG    Sample Expiration      11/16/2022,2359 Performed at [facility], [address] [place], [place] [address]    *Note: Due to a large number of results and/or encounters for the requested time period, some results have not been displayed. A complete set of results can be found in Results Review.   CT Cervical Spine Wo Contrast  Result Date: 11/13/2022 CLINICAL DATA:  Trauma, pain EXAM: CT CERVICAL SPINE WITHOUT CONTRAST TECHNIQUE: Multidetector CT imaging of the  cervical spine was performed without intravenous contrast. Multiplanar CT image reconstructions were also generated. RADIATION DOSE REDUCTION: This exam was performed according to the departmental dose-optimization program which includes automated exposure control, adjustment of the mA and/or kV according to patient size and/or use of iterative reconstruction technique. COMPARISON:  05/13/2022 FINDINGS: Alignment: Alignment of posterior margins of vertebral bodies is within normal limits. There is minimal dextroscoliosis. Skull base and vertebrae: No recent fracture is seen. Degenerative changes are noted, more so at C4-C5 and C5-C6 levels. Soft tissues and spinal canal: There is no central spinal stenosis. Disc levels: There is moderate encroachment of left neural foramen at C2-C3 level. There is mild to moderate encroachment of left neural foramen at C3-C4 level. There is moderate to marked encroachment of neural foramina at C4-C5 and C5-C6 levels. Upper chest: Unremarkable. Other: Thyroid is difficult to visualize. IMPRESSION: No recent fracture is seen in cervical spine. Cervical spondylosis with encroachment of neural foramina at multiple levels as described in the body of the report. Electronically Signed   By: Ernie Avena M.D.   On: 11/13/2022 12:36   CT Head Wo Contrast  Result Date: 11/13/2022 CLINICAL DATA:  Trauma, fall EXAM: CT HEAD WITHOUT CONTRAST TECHNIQUE: Contiguous axial images were obtained from the base of the skull through the vertex without intravenous contrast. RADIATION DOSE REDUCTION: This exam was performed according to the departmental dose-optimization program which includes automated exposure control, adjustment of the mA and/or kV according to patient size and/or use of iterative reconstruction technique. COMPARISON:  10/17/2022 FINDINGS:  Brain: No acute intracranial findings are seen. There are no signs of bleeding within the cranium. Cortical sulci are prominent. Vascular:  Unremarkable. Skull: No fracture is seen in calvarium. Sinuses/Orbits: There is a mucous retention cyst in right maxillary sinus. There is mild medial bulging of inferior aspect of medial wall of left orbit, possibly residual change from remote injury. Other: None. IMPRESSION: No acute intracranial findings are seen in noncontrast CT brain. Atrophy. Electronically Signed   By: Ernie Avena M.D.   On: 11/13/2022 12:30   DG Knee 2 Views Right  Result Date: 11/13/2022 CLINICAL DATA:  Trauma, pain EXAM: RIGHT KNEE - 1-2 VIEW COMPARISON:  10/17/2022 FINDINGS: The previous below-knee amputation in right lower leg. No recent fracture or dislocation is seen. No focal erosive changes are noted. Minimal bony spurs are seen in better alignment. There is no significant effusion. Arterial calcifications are seen in soft tissues. IMPRESSION: Previous below-knee amputation in right lower leg. No recent fracture or dislocation is seen in right knee. Arteriosclerosis. Electronically Signed   By: Ernie Avena M.D.   On: 11/13/2022 12:27   DG Hand Complete Right  Result Date: 11/13/2022 CLINICAL DATA:  Trauma, fall EXAM: RIGHT HAND - COMPLETE 3+ VIEW COMPARISON:  None Available. FINDINGS: Monitoring devices is partly obscuring the middle and distal phalanges of index finger. No recent fracture or dislocation is seen. Degenerative changes are noted with bony spurs and multiple interphalangeal joints and metacarpophalangeal joints. Bony spurs are seen in first carpometacarpal joint. Deformity in the distal ulna may be residual from previous injury. Degenerative changes are noted in the inferior radioulnar joint. There is a thin linear radiopacity in the soft tissues in the tip of right thumb. IMPRESSION: No fracture or dislocation is seen. Degenerative changes are noted in multiple joints. There is small thin linear radiopacity in the soft tissues along the palmar aspect of tip of distal phalanx right thumb  suggesting foreign body in the soft tissues. Electronically Signed   By: Ernie Avena M.D.   On: 11/13/2022 12:25   DG Pelvis Portable  Result Date: 11/13/2022 CLINICAL DATA:  Trauma, fall EXAM: PORTABLE PELVIS 1-2 VIEWS COMPARISON:  None Available. FINDINGS: There is no evidence of displaced pelvic fracture or diastasis. No pelvic bone lesions are seen. Nonobstructive pattern of overlying bowel gas. Large burden of stool. IMPRESSION: No displaced fracture of the pelvis or bilateral proximal femurs in single frontal view. Electronically Signed   By: Jearld Lesch M.D.   On: 11/13/2022 12:15   DG Chest Port 1 View  Result Date: 11/13/2022 CLINICAL DATA:  trauma EXAM: PORTABLE CHEST 1 VIEW COMPARISON:  CXR 10/17/22 FINDINGS: Left-sided dual lead cardiac device in place with unchanged lead positioning. Status post median sternotomy and aortic valve repair. Unchanged cardiac and mediastinal contours. No pleural effusion. No pneumothorax. No radiographically apparent displaced rib fractures. Visualized upper abdomen is unremarkable. Prominent bilateral interstitial opacities that could represent pulmonary venous congestion. IMPRESSION: Prominent bilateral interstitial opacities that could represent pulmonary venous congestion. Electronically Signed   By: Lorenza Cambridge M.D.   On: 11/13/2022 12:08    Pending Labs Unresulted Labs (From admission, onward)     Start     Ordered   11/14/22 0500  Basic metabolic panel  Tomorrow morning,   R        11/13/22 1420   11/14/22 0500  CBC  Tomorrow morning,   R        11/13/22 1420   11/13/22 1423  MRSA  Next Gen by PCR, Nasal  Once,   R        11/13/22 1422   11/13/22 1419  Hemoglobin A1c  Once,   R       Comments: To assess prior glycemic control    11/13/22 1420            Vitals/Pain Today's Vitals   11/13/22 1415 11/13/22 1430 11/13/22 1445 11/13/22 1500  BP: 131/87 124/84 (!) 145/96 (!) 140/85  Pulse: 66 63 64 (!) 59  Resp: 13 17 13 16    Temp:      TempSrc:      SpO2: 100% 100% 100% 100%  Weight:      Height:      PainSc:        Isolation Precautions No active isolations  Medications Medications  vancomycin (VANCOREADY) IVPB 1750 mg/350 mL (1,750 mg Intravenous New Bag/Given 11/13/22 1459)  ceFEPIme (MAXIPIME) 2 g in sodium chloride 0.9 % 100 mL IVPB (2 g Intravenous New Bag/Given 11/13/22 1445)  metroNIDAZOLE (FLAGYL) IVPB 500 mg (has no administration in time range)  HYDROmorphone (DILAUDID) injection 0.5 mg (has no administration in time range)  oxyCODONE (Oxy IR/ROXICODONE) immediate release tablet 5 mg (has no administration in time range)  amLODipine (NORVASC) tablet 5 mg (has no administration in time range)  bisoprolol (ZEBETA) tablet 5 mg (has no administration in time range)  rosuvastatin (CRESTOR) tablet 10 mg (has no administration in time range)  hydrOXYzine (ATARAX) tablet 10 mg (has no administration in time range)  traZODone (DESYREL) tablet 50 mg (has no administration in time range)  levothyroxine (SYNTHROID) tablet 175 mcg (has no administration in time range)  predniSONE (DELTASONE) tablet 2.5 mg (has no administration in time range)  ondansetron (ZOFRAN) tablet 4 mg (has no administration in time range)  pantoprazole (PROTONIX) EC tablet 40 mg (has no administration in time range)  polyethylene glycol powder (GLYCOLAX/MIRALAX) container 17 g (has no administration in time range)  senna-docusate (Senokot-S) tablet 1 tablet (1 tablet Oral Given 11/13/22 1442)  sucralfate (CARAFATE) tablet 1 g (has no administration in time range)  gabapentin (NEURONTIN) capsule 600 mg (has no administration in time range)  rOPINIRole (REQUIP) tablet 1 mg (has no administration in time range)  potassium chloride SA (KLOR-CON M) CR tablet 20 mEq (has no administration in time range)  albuterol (PROVENTIL) (2.5 MG/3ML) 0.083% nebulizer solution 2.5 mg (has no administration in time range)   Budeson-Glycopyrrol-Formoterol 160-9-4.8 MCG/ACT AERO 2 puff (has no administration in time range)  Mucinex DM Maximum Strength 60-1200 MG TB12 1 tablet (has no administration in time range)  Menthol-Camphor 11-11 % CREA (has no administration in time range)  Polyethyl Glyc-Propyl Glyc PF 0.4-0.3 % SOLN 1 drop (has no administration in time range)  insulin aspart (novoLOG) injection 0-9 Units (has no administration in time range)  heparin injection 5,000 Units (has no administration in time range)  furosemide (LASIX) injection 20 mg (has no administration in time range)  lactobacillus acidophilus (BACID) tablet 2 tablet (has no administration in time range)  morphine (PF) 2 MG/ML injection 2 mg (2 mg Intravenous Given 11/13/22 1200)  morphine (PF) 2 MG/ML injection 2 mg (2 mg Intravenous Given 11/13/22 1440)  diphenhydrAMINE (BENADRYL) injection 50 mg (50 mg Intravenous Given 11/13/22 1438)    Mobility walks with device     Focused Assessments Dehisced R BKA   R Recommendations: See Admitting Provider Note  Report given to:   Additional Notes: Ambulates with walker at baseline

## 2022-11-13 NOTE — Plan of Care (Signed)
  Problem: Nutritional: Goal: Maintenance of adequate nutrition will improve Outcome: Progressing   Problem: Education: Goal: Knowledge of General Education information will improve Description: Including pain rating scale, medication(s)/side effects and non-pharmacologic comfort measures Outcome: Progressing   Problem: Safety: Goal: Ability to remain free from injury will improve Outcome: Progressing

## 2022-11-13 NOTE — H&P (Signed)
History and Physical    [name] ZHY:[id number] DOB: Jun 08, 1950 DOA: 11/13/2022  PCP: Alysia Penna, MD (Confirm with patient/family/NH records and if not entered, this has to be entered at [facility] [address] point of entry) Patient coming from: Home  I have personally briefly reviewed patient's old medical records in [facility]  Chief Complaint: I fell and tore open my leg wound  HPI: [name] is a 72 y.o. male with medical history significant of chronic A-fib on Eliquis, chronic HFpEF, IIDM, CKD stage II, HTN, PPM, COPD, HLD, hypothyroidism, diabetic osteomyelitis status post right BKA with chronic poor healing right leg stump wound, presented with fall and dehiscence of the right BKA wound.  Baseline, patient uses roller walker to ambulate, today while coming back to home from outside, roller walker feet the door frame and patient lost balance and fell backward on his right knee and immediately tore open right BKA wound.  Last few days, patient has had worsening of rash and swelling and some more bloody discharges from the chronic nonhealing BKA stump wound and he was prescribed with doxycycline.  Last month patient was admitted for right leg stump cellulitis and was discharged on Keflex which he completed.  Denies any fever or chills.  ED Course: Afebrile, none tachycardia nonhypotensive.  Trauma scan negative for fracture or dislocation head and neck right knee or right hand.  Creatinine 1.8 compared to baseline 1.4, chest x-ray showed mild pulmonary congestion.  Patient was started on one-time dose of vancomycin and cefepime.  Review of Systems: As per HPI otherwise 14 point review of systems negative.    Past Medical History:  Diagnosis Date   Aortic stenosis, mild    Arthritis of hand    "just a little bit in both hands" (03/31/2012)   Asthma    "little bit" (03/31/2012)   Atrial fibrillation (HCC)    dx '04; DCCV '04, placed on flecainide, failed DCCV 04/2010, flecainide  stopped; s/p successful A.fib ablation 01/31/12   CHF (congestive heart failure) (HCC)    Controlled type 2 diabetes mellitus without complication, without long-term current use of insulin (HCC) 12/13/2019   COPD (chronic obstructive pulmonary disease) (HCC)    DJD (degenerative joint disease)    Fatty liver    Fibromyalgia    GERD (gastroesophageal reflux disease)    Hyperlipidemia    Hypertension    Hypothyroidism    S/P radiation   Left atrial enlargement    LA size 45mm by echo 11/21/11   Mitral regurgitation    trivial   Obstructive sleep apnea    mild by sleep study 2013; pt stated he does not have a machine because "it wasn't bad enough for him to have one"   Pacemaker 03/31/2012   Pneumonia    last time 2023   Restless leg syndrome    Second degree AV block    Splenomegaly     Past Surgical History:  Procedure Laterality Date   AMPUTATION Right 08/23/2022   Procedure: RIGHT FOOT FIFTH RAY AMPUTATION;  Surgeon: [name], MD;  Location: [facility] OR;  Service: Orthopedics;  Laterality: Right;   AMPUTATION Right 09/04/2022   Procedure: RIGHT BELOW KNEE AMPUTATION;  Surgeon: [name], MD;  Location: [facility] OR;  Service: Orthopedics;  Laterality: Right;   AORTIC VALVE REPLACEMENT N/A 08/22/2015   Procedure: AORTIC VALVE REPLACEMENT (AVR);  Surgeon: Kerin Perna, MD;  Location: [facility] OR;  Service: Open Heart Surgery;  Laterality: N/A;   ATRIAL  FIBRILLATION ABLATION  01/30/2012   PVI by Dr. Johney Frame   ATRIAL FIBRILLATION ABLATION N/A 01/31/2012   Procedure: ATRIAL FIBRILLATION ABLATION;  Surgeon: [name], MD;  Location: [facility] CATH LAB;  Service: Cardiovascular;  Laterality: N/A;   ATRIAL FIBRILLATION ABLATION N/A 04/19/2020   Procedure: ATRIAL FIBRILLATION ABLATION;  Surgeon: [name], MD;  Location: [facility] INVASIVE CV LAB;  Service: Cardiovascular;  Laterality: N/A;   BACK SURGERY     X 3   BIOPSY  01/01/2022   Procedure: BIOPSY;  Surgeon: Kathi Der, MD;  Location: [facility]  ENDOSCOPY;  Service: Gastroenterology;;   CARDIAC CATHETERIZATION N/A 08/09/2015   Procedure: Right/Left Heart Cath and Coronary Angiography;  Surgeon: Peter M Swaziland, MD;  Location: [facility] INVASIVE CV LAB;  Service: Cardiovascular;  Laterality: N/A;   CARDIAC CATHETERIZATION N/A 02/05/2016   Procedure: Right/Left Heart Cath and Coronary Angiography;  Surgeon: Corky Crafts, MD;  Location: [facility] INVASIVE CV LAB;  Service: Cardiovascular;  Laterality: N/A;   CARDIAC CATHETERIZATION N/A 04/17/2016   Procedure: Right Heart Cath;  Surgeon: Dolores Patty, MD;  Location: [facility] INVASIVE CV LAB;  Service: Cardiovascular;  Laterality: N/A;   CARDIOVASCULAR STRESS TEST  03/12/2002   EF 48%, NO EVIDENCE OF ISCHEMIA   CARDIOVERSION  01/2012; 03/31/2012   CARDIOVERSION N/A 02/25/2012   Procedure: CARDIOVERSION;  Surgeon: [name], MD;  Location: [facility] CATH LAB;  Service: Cardiovascular;  Laterality: N/A;   CARDIOVERSION N/A 03/31/2012   Procedure: CARDIOVERSION;  Surgeon: [name], MD;  Location: [facility] CATH LAB;  Service: Cardiovascular;  Laterality: N/A;   CARDIOVERSION N/A 11/23/2015   Procedure: CARDIOVERSION;  Surgeon: Laurey Morale, MD;  Location: [facility] ENDOSCOPY;  Service: Cardiovascular;  Laterality: N/A;   CARDIOVERSION N/A 04/08/2016   Procedure: CARDIOVERSION;  Surgeon: Dolores Patty, MD;  Location: [facility] ENDOSCOPY;  Service: Cardiovascular;  Laterality: N/A;   CARDIOVERSION N/A 09/14/2018   Procedure: CARDIOVERSION;  Surgeon: Wendall Stade, MD;  Location: [facility] ENDOSCOPY;  Service: Cardiovascular;  Laterality: N/A;   CARDIOVERSION N/A 10/19/2019   Procedure: CARDIOVERSION;  Surgeon: Dolores Patty, MD;  Location: [facility] ENDOSCOPY;  Service: Cardiovascular;  Laterality: N/A;   CARDIOVERSION N/A 02/25/2020   Procedure: CARDIOVERSION;  Surgeon: Pricilla Riffle, MD;  Location: [facility] ENDOSCOPY;  Service: Cardiovascular;  Laterality: N/A;   CARDIOVERSION N/A 05/25/2020   Procedure: CARDIOVERSION;   Surgeon: Lewayne Bunting, MD;  Location: [facility] ENDOSCOPY;  Service: Cardiovascular;  Laterality: N/A;   CARDIOVERSION N/A 07/10/2020   Procedure: CARDIOVERSION;  Surgeon: Jake Bathe, MD;  Location: [facility] ENDOSCOPY;  Service: Cardiovascular;  Laterality: N/A;   CLIPPING OF ATRIAL APPENDAGE N/A 08/22/2015   Procedure: CLIPPING OF ATRIAL APPENDAGE;  Surgeon: Kerin Perna, MD;  Location: [facility] OR;  Service: Open Heart Surgery;  Laterality: N/A;   COLONOSCOPY WITH PROPOFOL N/A 01/01/2022   Procedure: COLONOSCOPY WITH PROPOFOL;  Surgeon: Kathi Der, MD;  Location: [facility] ENDOSCOPY;  Service: Gastroenterology;  Laterality: N/A;   DOPPLER ECHOCARDIOGRAPHY  03/11/2002   EF 70-75%   FINGER SURGERY Left    Middle finger   FINGER TENDON REPAIR  1980's   "right little finger" (03/31/2012)   FLEXIBLE SIGMOIDOSCOPY N/A 12/30/2021   Procedure: FLEXIBLE SIGMOIDOSCOPY;  Surgeon: Charlott Rakes, MD;  Location: [facility] ENDOSCOPY;  Service: Gastroenterology;  Laterality: N/A;   INSERT / REPLACE / REMOVE PACEMAKER     St Jude   IR RADIOLOGIST EVAL & MGMT  05/06/2019   IR RADIOLOGIST EVAL & MGMT  10/06/2019   IR RADIOLOGIST  EVAL & MGMT  10/31/2020   IR RADIOLOGIST EVAL & MGMT  01/18/2021   IR RADIOLOGIST EVAL & MGMT  03/08/2021   IR RADIOLOGIST EVAL & MGMT  06/13/2021   KIDNEY SURGERY Left    LEFT AND RIGHT HEART CATHETERIZATION WITH CORONARY ANGIOGRAM N/A 10/27/2012   Procedure: LEFT AND RIGHT HEART CATHETERIZATION WITH CORONARY ANGIOGRAM;  Surgeon: Pamella Pert, MD;  Location: [facility] CATH LAB;  Service: Cardiovascular;  Laterality: N/A;   LUMBAR DISC SURGERY  1980's X2;  2000's   MAZE N/A 08/22/2015   Procedure: MAZE;  Surgeon: Kerin Perna, MD;  Location: [facility] OR;  Service: Open Heart Surgery;  Laterality: N/A;   PACEMAKER INSERTION  03/31/2012   STJ Accent DR pacemaker implanted by Dr Johney Frame   PERMANENT PACEMAKER INSERTION N/A 03/31/2012   Procedure: PERMANENT PACEMAKER INSERTION;  Surgeon: [name],  MD;  Location: [facility] CATH LAB;  Service: Cardiovascular;  Laterality: N/A;   PPM GENERATOR CHANGEOUT N/A 08/08/2020   Procedure: PPM GENERATOR CHANGEOUT;  Surgeon: [name], MD;  Location: [facility] INVASIVE CV LAB;  Service: Cardiovascular;  Laterality: N/A;   RADIOLOGY WITH ANESTHESIA Left 02/14/2021   Procedure: RADIOLOGY WITH ANESTHESIA LEFT RENAL CRYOABLATION;  Surgeon: Irish Lack, MD;  Location: [facility] ORS;  Service: Radiology;  Laterality: Left;   STUMP REVISION Right 10/09/2022   Procedure: REVISION RIGHT BELOW KNEE AMPUTATION;  Surgeon: [name], MD;  Location: [facility] OR;  Service: Orthopedics;  Laterality: Right;   TEE WITHOUT CARDIOVERSION  01/30/2012   Procedure: TRANSESOPHAGEAL ECHOCARDIOGRAM (TEE);  Surgeon: Laurey Morale, MD;  Location: [facility] ENDOSCOPY;  Service: Cardiovascular;  Laterality: N/A;  ablation next day   TEE WITHOUT CARDIOVERSION N/A 08/22/2015   Procedure: TRANSESOPHAGEAL ECHOCARDIOGRAM (TEE);  Surgeon: Kerin Perna, MD;  Location: [facility] OR;  Service: Open Heart Surgery;  Laterality: N/A;   TEE WITHOUT CARDIOVERSION N/A 10/19/2019   Procedure: TRANSESOPHAGEAL ECHOCARDIOGRAM (TEE);  Surgeon: Dolores Patty, MD;  Location: [facility] ENDOSCOPY;  Service: Cardiovascular;  Laterality: N/A;   US ECHOCARDIOGRAPHY  08/07/2009   EF 55-60%     reports that he quit smoking about 31 years ago. His smoking use included cigarettes. He started smoking about 61 years ago. He has a 30.2 pack-year smoking history. He has never been exposed to tobacco smoke. He has never used smokeless tobacco. He reports that he does not currently use alcohol. He reports current drug use. Drug: Marijuana.  Allergies  Allergen Reactions   Meloxicam Rash and Other (See Comments)    Red Man Syndrome, also   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 syndrome 09/02/15, resolved with diphenhydramine and slowing of rate    Family History  Problem Relation Age of Onset   Heart failure Mother    Stroke  Father    Neuropathy Neg Hx      Prior to Admission medications   Medication Sig Start Date End Date Taking? Authorizing Provider  albuterol (PROVENTIL HFA;VENTOLIN HFA) 108 (90 Base) MCG/ACT inhaler Inhale 2 puffs into the lungs every 6 (six) hours as needed for wheezing or shortness of breath. 12/28/15   Leslye Peer, MD  amLODipine (NORVASC) 5 MG tablet Take 5 mg by mouth in the morning.    [provider]  apixaban (ELIQUIS) 5 MG TABS tablet Take 1 tablet (5 mg total) by mouth 2 (two) times daily. 08/21/22   Graciella Freer, PA-C  bisoprolol (ZEBETA) 5 MG tablet Take 5 mg by mouth daily.  [provider]  Budeson-Glycopyrrol-Formoterol (BREZTRI AEROSPHERE) 160-9-4.8 MCG/ACT AERO Inhale 2 puffs into the lungs in the morning and at bedtime. 05/23/22   Leslye Peer, MD  Cholecalciferol (VITAMIN D) 50 MCG (2000 UT) tablet Take 8,000 Units by mouth daily.    [provider]  Dextromethorphan-guaiFENesin (MUCINEX DM MAXIMUM STRENGTH) 60-1200 MG TB12 Take 1 tablet by mouth daily.    [provider]  doxycycline (VIBRA-TABS) 100 MG tablet Take 1 tablet (100 mg total) by mouth 2 (two) times daily. 11/12/22   [name], MD  empagliflozin (JARDIANCE) 10 MG TABS tablet Take 1 tablet (10 mg total) by mouth daily. 05/27/22   Bensimhon, Bevelyn Buckles, MD  gabapentin (NEURONTIN) 300 MG capsule Take 2 capsules (600 mg total) by mouth 3 (three) times daily. for neuropathy 10/19/22   Rai, Delene Ruffini, MD  hydrOXYzine (ATARAX) 10 MG tablet Take 1 tablet (10 mg total) by mouth 3 (three) times daily as needed. 10/23/22   Adonis Huguenin, NP  levothyroxine (SYNTHROID, LEVOTHROID) 175 MCG tablet Take 175 mcg by mouth daily before breakfast.    [provider]  Menthol-Camphor (TIGER BALM ARTHRITIS RUB EX) Apply 1 application topically as needed (for leg pain).    [provider]  montelukast (SINGULAIR) 10 MG tablet TAKE 1 TABLET(10 MG) BY MOUTH AT  BEDTIME Patient taking differently: Take 10 mg by mouth daily. 06/27/22   Leslye Peer, MD  Multiple Minerals-Vitamins (CAL-MAG-ZINC-D) TABS Take 1 tablet by mouth daily.    [provider]  Multiple Vitamin (MULTIVITAMIN WITH MINERALS) TABS tablet Take 1 tablet by mouth daily. 12/15/19   Marguerita Merles Latif, DO  ondansetron (ZOFRAN) 4 MG tablet Take 1 tablet (4 mg total) by mouth every 8 (eight) hours as needed for nausea or vomiting. 10/19/22   Rai, Delene Ruffini, MD  oxyCODONE-acetaminophen (PERCOCET/ROXICET) 5-325 MG tablet Take 1 tablet by mouth every 6 (six) hours as needed. 11/01/22   Adonis Huguenin, NP  OXYGEN Inhale 2 L/min into the lungs See admin instructions. 2 L/min at bedtime and 2 L/min throughout the day as needed for shortness of breath    [provider]  pantoprazole (PROTONIX) 40 MG tablet Take 40 mg by mouth daily before breakfast.    [provider]  Polyethyl Glyc-Propyl Glyc PF (SYSTANE ULTRA PF) 0.4-0.3 % SOLN Place 1 drop into both eyes 2 (two) times daily as needed (dry eyes).    [provider]  polyethylene glycol powder (GLYCOLAX/MIRALAX) 17 GM/SCOOP powder Take 17 g by mouth daily as needed for mild constipation. Patient taking differently: Take 17 g by mouth daily. 01/23/20   Glade Lloyd, MD  potassium chloride SA (KLOR-CON M) 20 MEQ tablet Take 20 mEq by mouth See admin instructions. Take 20 meq twice daily every other day    [provider]  predniSONE (DELTASONE) 5 MG tablet Take 0.5 tablets (2.5 mg total) by mouth daily with breakfast. 10/22/21   Byrum, Les Pou, MD  rOPINIRole (REQUIP) 1 MG tablet Take 1 tablet (1 mg total) by mouth in the morning and at bedtime. 01/23/20   Glade Lloyd, MD  rosuvastatin (CRESTOR) 10 MG tablet Take 10 mg by mouth at bedtime.  08/08/16   [provider]  senna-docusate (SENOKOT-S) 8.6-50 MG tablet Take 1 tablet by mouth 2 (two) times daily.    [provider]   spironolactone (ALDACTONE) 25 MG tablet Take 1 tablet (25 mg total) by mouth daily. 02/16/16   Tillery,  Mariam Dollar, PA-C  sucralfate (CARAFATE) 1 g tablet Take 1 g by mouth 3 (three) times daily as needed (Acid reflux). 03/27/22   [provider]  torsemide (DEMADEX) 10 MG tablet Take 15 mg by mouth every other day.    [provider]  traZODone (DESYREL) 50 MG tablet Take 50 mg by mouth at bedtime. 08/30/20   [provider]    Physical Exam: Vitals:   11/13/22 1118 11/13/22 1130 11/13/22 1145 11/13/22 1241  BP:  117/69 124/78   Pulse:  (!) 59 (!) 59   Resp:  16 18   Temp:    97.9 F (36.6 C)  TempSrc:    Oral  SpO2:  95% 96%   Weight: 74.8 kg     Height: 5\' 11"  (1.803 m)       Constitutional: NAD, calm, comfortable Vitals:   11/13/22 1118 11/13/22 1130 11/13/22 1145 11/13/22 1241  BP:  117/69 124/78   Pulse:  (!) 59 (!) 59   Resp:  16 18   Temp:    97.9 F (36.6 C)  TempSrc:    Oral  SpO2:  95% 96%   Weight: 74.8 kg     Height: 5\' 11"  (1.803 m)      Eyes: PERRL, lids and conjunctivae normal ENMT: Mucous membranes are moist. Posterior pharynx clear of any exudate or lesions.Normal dentition.  Neck: normal, supple, no masses, no thyromegaly Respiratory: clear to auscultation bilaterally, no wheezing, fine crackles on bilateral lower fields. Normal respiratory effort. No accessory muscle use.  Cardiovascular: Regular rate and rhythm, no murmurs / rubs / gallops. No extremity edema. 2+ pedal pulses. No carotid bruits.  Abdomen: no tenderness, no masses palpated. No hepatosplenomegaly. Bowel sounds positive.  Musculoskeletal: no clubbing / cyanosis. No joint deformity upper and lower extremities. Good ROM, no contractures. Normal muscle tone.  Skin: Ruptured right stump wound with bleeding and appearing purulent discharge underneath Neurologic: CN 2-12 grossly intact. Sensation intact, DTR normal. Strength 5/5 in all 4.  Psychiatric: Normal  judgment and insight. Alert and oriented x 3. Normal mood.     Labs on Admission: I have personally reviewed following labs and imaging studies  CBC: Recent Labs  Lab 11/13/22 1102  WBC 7.3  HGB 11.8*  HCT 38.1*  MCV 92.0  PLT 156   Basic Metabolic Panel: Recent Labs  Lab 11/13/22 1102  NA 133*  K 4.4  CL 93*  CO2 26  GLUCOSE 88  BUN 21  CREATININE 1.82*  CALCIUM 9.1   GFR: Estimated Creatinine Clearance: 38.2 mL/min (A) (by C-G formula based on SCr of 1.82 mg/dL (H)). Liver Function Tests: Recent Labs  Lab 11/13/22 1102  AST 34  ALT 25  ALKPHOS 52  BILITOT 0.2*  PROT 7.1  ALBUMIN 3.9   No results for input(s): "LIPASE", "AMYLASE" in the last 168 hours. No results for input(s): "AMMONIA" in the last 168 hours. Coagulation Profile: No results for input(s): "INR", "PROTIME" in the last 168 hours. Cardiac Enzymes: No results for input(s): "CKTOTAL", "CKMB", "CKMBINDEX", "TROPONINI" in the last 168 hours. BNP (last 3 results) No results for input(s): "PROBNP" in the last 8760 hours. HbA1C: No results for input(s): "HGBA1C" in the last 72 hours. CBG: No results for input(s): "GLUCAP" in the last 168 hours. Lipid Profile: No results for input(s): "CHOL", "HDL", "LDLCALC", "TRIG", "CHOLHDL", "LDLDIRECT" in the last 72 hours. Thyroid Function Tests: No results for input(s): "TSH", "T4TOTAL", "FREET4", "T3FREE", "THYROIDAB" in the last 72 hours.  Anemia Panel: No results for input(s): "VITAMINB12", "FOLATE", "FERRITIN", "TIBC", "IRON", "RETICCTPCT" in the last 72 hours. Urine analysis:    Component Value Date/Time   COLORURINE YELLOW 10/17/2022 1016   APPEARANCEUR CLEAR 10/17/2022 1016   LABSPEC 1.015 10/17/2022 1016   PHURINE 6.0 10/17/2022 1016   GLUCOSEU >=500 (A) 10/17/2022 1016   HGBUR NEGATIVE 10/17/2022 1016   BILIRUBINUR NEGATIVE 10/17/2022 1016   KETONESUR NEGATIVE 10/17/2022 1016   PROTEINUR 30 (A) 10/17/2022 1016   NITRITE NEGATIVE 10/17/2022  1016   LEUKOCYTESUR LARGE (A) 10/17/2022 1016    Radiological Exams on Admission: CT Cervical Spine Wo Contrast  Result Date: 11/13/2022 CLINICAL DATA:  Trauma, pain EXAM: CT CERVICAL SPINE WITHOUT CONTRAST TECHNIQUE: Multidetector CT imaging of the cervical spine was performed without intravenous contrast. Multiplanar CT image reconstructions were also generated. RADIATION DOSE REDUCTION: This exam was performed according to the departmental dose-optimization program which includes automated exposure control, adjustment of the mA and/or kV according to patient size and/or use of iterative reconstruction technique. COMPARISON:  05/13/2022 FINDINGS: Alignment: Alignment of posterior margins of vertebral bodies is within normal limits. There is minimal dextroscoliosis. Skull base and vertebrae: No recent fracture is seen. Degenerative changes are noted, more so at C4-C5 and C5-C6 levels. Soft tissues and spinal canal: There is no central spinal stenosis. Disc levels: There is moderate encroachment of left neural foramen at C2-C3 level. There is mild to moderate encroachment of left neural foramen at C3-C4 level. There is moderate to marked encroachment of neural foramina at C4-C5 and C5-C6 levels. Upper chest: Unremarkable. Other: Thyroid is difficult to visualize. IMPRESSION: No recent fracture is seen in cervical spine. Cervical spondylosis with encroachment of neural foramina at multiple levels as described in the body of the report. Electronically Signed   By: Ernie Avena M.D.   On: 11/13/2022 12:36   CT Head Wo Contrast  Result Date: 11/13/2022 CLINICAL DATA:  Trauma, fall EXAM: CT HEAD WITHOUT CONTRAST TECHNIQUE: Contiguous axial images were obtained from the base of the skull through the vertex without intravenous contrast. RADIATION DOSE REDUCTION: This exam was performed according to the departmental dose-optimization program which includes automated exposure control, adjustment of the mA  and/or kV according to patient size and/or use of iterative reconstruction technique. COMPARISON:  10/17/2022 FINDINGS: Brain: No acute intracranial findings are seen. There are no signs of bleeding within the cranium. Cortical sulci are prominent. Vascular: Unremarkable. Skull: No fracture is seen in calvarium. Sinuses/Orbits: There is a mucous retention cyst in right maxillary sinus. There is mild medial bulging of inferior aspect of medial wall of left orbit, possibly residual change from remote injury. Other: None. IMPRESSION: No acute intracranial findings are seen in noncontrast CT brain. Atrophy. Electronically Signed   By: Ernie Avena M.D.   On: 11/13/2022 12:30   DG Knee 2 Views Right  Result Date: 11/13/2022 CLINICAL DATA:  Trauma, pain EXAM: RIGHT KNEE - 1-2 VIEW COMPARISON:  10/17/2022 FINDINGS: The previous below-knee amputation in right lower leg. No recent fracture or dislocation is seen. No focal erosive changes are noted. Minimal bony spurs are seen in better alignment. There is no significant effusion. Arterial calcifications are seen in soft tissues. IMPRESSION: Previous below-knee amputation in right lower leg. No recent fracture or dislocation is seen in right knee. Arteriosclerosis. Electronically Signed   By: Ernie Avena M.D.   On: 11/13/2022 12:27   DG Hand Complete Right  Result Date: 11/13/2022 CLINICAL DATA:  Trauma, fall EXAM: RIGHT HAND -  COMPLETE 3+ VIEW COMPARISON:  None Available. FINDINGS: Monitoring devices is partly obscuring the middle and distal phalanges of index finger. No recent fracture or dislocation is seen. Degenerative changes are noted with bony spurs and multiple interphalangeal joints and metacarpophalangeal joints. Bony spurs are seen in first carpometacarpal joint. Deformity in the distal ulna may be residual from previous injury. Degenerative changes are noted in the inferior radioulnar joint. There is a thin linear radiopacity in the soft  tissues in the tip of right thumb. IMPRESSION: No fracture or dislocation is seen. Degenerative changes are noted in multiple joints. There is small thin linear radiopacity in the soft tissues along the palmar aspect of tip of distal phalanx right thumb suggesting foreign body in the soft tissues. Electronically Signed   By: Ernie Avena M.D.   On: 11/13/2022 12:25   DG Pelvis Portable  Result Date: 11/13/2022 CLINICAL DATA:  Trauma, fall EXAM: PORTABLE PELVIS 1-2 VIEWS COMPARISON:  None Available. FINDINGS: There is no evidence of displaced pelvic fracture or diastasis. No pelvic bone lesions are seen. Nonobstructive pattern of overlying bowel gas. Large burden of stool. IMPRESSION: No displaced fracture of the pelvis or bilateral proximal femurs in single frontal view. Electronically Signed   By: Jearld Lesch M.D.   On: 11/13/2022 12:15   DG Chest Port 1 View  Result Date: 11/13/2022 CLINICAL DATA:  trauma EXAM: PORTABLE CHEST 1 VIEW COMPARISON:  CXR 10/17/22 FINDINGS: Left-sided dual lead cardiac device in place with unchanged lead positioning. Status post median sternotomy and aortic valve repair. Unchanged cardiac and mediastinal contours. No pleural effusion. No pneumothorax. No radiographically apparent displaced rib fractures. Visualized upper abdomen is unremarkable. Prominent bilateral interstitial opacities that could represent pulmonary venous congestion. IMPRESSION: Prominent bilateral interstitial opacities that could represent pulmonary venous congestion. Electronically Signed   By: Lorenza Cambridge M.D.   On: 11/13/2022 12:08    EKG: None  Assessment/Plan Principal Problem:   Osteomyelitis (HCC) Active Problems:   Acute on chronic diastolic (congestive) heart failure (HCC)   Dehiscence of amputation stump of right lower extremity (HCC)   Diabetic ulcer of lower leg (HCC)  (please populate well all problems here in Problem List. (For example, if patient is on BP meds at home and  you resume or decide to hold them, it is a problem that needs to be her. Same for CAD, COPD, HLD and so on)  Right BKA stump wound dehiscence Recurrent right BKA ulcer infection -Review most recent wound culture back in June showed both MRSA and MSSA, will start patient on vancomycin, change cefepime to Ancef -ED physician talked to Dr. Lajoyce Corners who plans for right BKA stump wound revision on Friday -Hold off Eliquis  Acute on chronic HFpEF -Mild fluid overload, 1 dose of IV Lasix 20 mg -Recheck checks x-ray tomorrow to decide further diuresis plan  AKI on CKD stage II -Mild cardiorenal syndrome, diuresis as above -Hold off spironolactone -Recheck kidney function tomorrow  PAF -In sinus rhythm, recheck EKG -hold off Eliquis  HTN -Controlled, continue amlodipine  COPD -No symptoms or signs of acute exacerbation, continue ICS, LABA and as needed DuoNebs  IIDM -SSI for now  DVT prophylaxis: Heparin subcu Code Status: DNR Family Communication: Wife at bedside Disposition Plan: Patient is sick with dehiscence of right BKA wound with worsening of right BKA nonhealing ulcer infection, requiring IV antibiotics and inpatient orthopedic surgeon consultation and procedure, expect more than 2 midnight hospital stay Consults called: Dr. Lajoyce Corners Admission status: MedSurg admission  Emeline General MD [facility] Pager [phone]  11/13/2022, 2:28 PM

## 2022-11-13 NOTE — ED Triage Notes (Signed)
Per EMS and pt report, pt was ambulating with his walker when his walker caught and "bounced back at him". Pt fell and hit the back of his head, has a skin tear to his L elbow, and his R BKA healing wound dehisced. Pressure dressings applied to R leg and L elbow. C-collar in place. Pt had a recent BKA and revision 09/04/2022. Pt is on Eliquis. Reports his pain 7/10.

## 2022-11-14 ENCOUNTER — Inpatient Hospital Stay (HOSPITAL_COMMUNITY): Payer: Medicare Other

## 2022-11-14 ENCOUNTER — Encounter: Payer: Self-pay | Admitting: Orthopedic Surgery

## 2022-11-14 DIAGNOSIS — M79604 Pain in right leg: Secondary | ICD-10-CM

## 2022-11-14 DIAGNOSIS — T8781 Dehiscence of amputation stump: Secondary | ICD-10-CM

## 2022-11-14 DIAGNOSIS — I5033 Acute on chronic diastolic (congestive) heart failure: Secondary | ICD-10-CM | POA: Diagnosis not present

## 2022-11-14 DIAGNOSIS — I480 Paroxysmal atrial fibrillation: Secondary | ICD-10-CM

## 2022-11-14 DIAGNOSIS — W19XXXA Unspecified fall, initial encounter: Secondary | ICD-10-CM

## 2022-11-14 LAB — CBC
HCT: 42.4 % (ref 39.0–52.0)
Hemoglobin: 13.3 g/dL (ref 13.0–17.0)
MCH: 28.8 pg (ref 26.0–34.0)
MCHC: 31.4 g/dL (ref 30.0–36.0)
MCV: 91.8 fL (ref 80.0–100.0)
Platelets: 185 10*3/uL (ref 150–400)
RBC: 4.62 MIL/uL (ref 4.22–5.81)
RDW: 14.9 % (ref 11.5–15.5)
WBC: 10.5 10*3/uL (ref 4.0–10.5)
nRBC: 0 % (ref 0.0–0.2)

## 2022-11-14 LAB — BASIC METABOLIC PANEL
Anion gap: 14 (ref 5–15)
BUN: 18 mg/dL (ref 8–23)
CO2: 25 mmol/L (ref 22–32)
Calcium: 9.2 mg/dL (ref 8.9–10.3)
Chloride: 93 mmol/L — ABNORMAL LOW (ref 98–111)
Creatinine, Ser: 1.58 mg/dL — ABNORMAL HIGH (ref 0.61–1.24)
GFR, Estimated: 46 mL/min — ABNORMAL LOW (ref >60)
Glucose, Bld: 102 mg/dL — ABNORMAL HIGH (ref 70–99)
Potassium: 4.6 mmol/L (ref 3.5–5.1)
Sodium: 132 mmol/L — ABNORMAL LOW (ref 135–145)

## 2022-11-14 LAB — GLUCOSE, CAPILLARY
Glucose-Capillary: 95 mg/dL (ref 70–99)
Glucose-Capillary: 101 mg/dL — ABNORMAL HIGH (ref 70–99)
Glucose-Capillary: 117 mg/dL — ABNORMAL HIGH (ref 70–99)
Glucose-Capillary: 139 mg/dL — ABNORMAL HIGH (ref 70–99)

## 2022-11-14 LAB — HEMOGLOBIN A1C
Hgb A1c MFr Bld: 6.3 % — ABNORMAL HIGH (ref 4.8–5.6)
Mean Plasma Glucose: 134 mg/dL

## 2022-11-14 MED ORDER — TRANEXAMIC ACID 1000 MG/10ML IV SOLN
2000.0000 mg | INTRAVENOUS | Status: DC
  Filled 2022-11-14: qty 20

## 2022-11-14 NOTE — Consult Note (Signed)
ORTHOPAEDIC CONSULTATION  REQUESTING PHYSICIAN: Osvaldo Shipper, MD  Chief Complaint: Traumatic dehiscence right below-knee amputation.  HPI: [name] is a 72 y.o. male who presents with traumatic dehiscence right below-knee amputation.  Patient states that he was using a walker when he fell with direct impact on the residual limb causing wound dehiscence.  Past Medical History:  Diagnosis Date   Aortic stenosis, mild    Arthritis of hand    "just a little bit in both hands" (03/31/2012)   Asthma    "little bit" (03/31/2012)   Atrial fibrillation (HCC)    dx '04; DCCV '04, placed on flecainide, failed DCCV 04/2010, flecainide stopped; s/p successful A.fib ablation 01/31/12   CHF (congestive heart failure) (HCC)    Controlled type 2 diabetes mellitus without complication, without long-term current use of insulin (HCC) 12/13/2019   COPD (chronic obstructive pulmonary disease) (HCC)    DJD (degenerative joint disease)    Fatty liver    Fibromyalgia    GERD (gastroesophageal reflux disease)    Hyperlipidemia    Hypertension    Hypothyroidism    S/P radiation   Left atrial enlargement    LA size 45mm by echo 11/21/11   Mitral regurgitation    trivial   Obstructive sleep apnea    mild by sleep study 2013; pt stated he does not have a machine because "it wasn't bad enough for him to have one"   Pacemaker 03/31/2012   Pneumonia    last time 2023   Restless leg syndrome    Second degree AV block    Splenomegaly    Past Surgical History:  Procedure Laterality Date   AMPUTATION Right 08/23/2022   Procedure: RIGHT FOOT FIFTH RAY AMPUTATION;  Surgeon: [name], MD;  Location: [facility] OR;  Service: Orthopedics;  Laterality: Right;   AMPUTATION Right 09/04/2022   Procedure: RIGHT BELOW KNEE AMPUTATION;  Surgeon: [name], MD;  Location: [facility] OR;  Service: Orthopedics;  Laterality: Right;   AORTIC VALVE REPLACEMENT N/A 08/22/2015   Procedure: AORTIC VALVE REPLACEMENT (AVR);   Surgeon: Kerin Perna, MD;  Location: [facility] OR;  Service: Open Heart Surgery;  Laterality: N/A;   ATRIAL FIBRILLATION ABLATION  01/30/2012   PVI by Dr. Johney Frame   ATRIAL FIBRILLATION ABLATION N/A 01/31/2012   Procedure: ATRIAL FIBRILLATION ABLATION;  Surgeon: [name], MD;  Location: [facility] CATH LAB;  Service: Cardiovascular;  Laterality: N/A;   ATRIAL FIBRILLATION ABLATION N/A 04/19/2020   Procedure: ATRIAL FIBRILLATION ABLATION;  Surgeon: [name], MD;  Location: [facility] INVASIVE CV LAB;  Service: Cardiovascular;  Laterality: N/A;   BACK SURGERY     X 3   BIOPSY  01/01/2022   Procedure: BIOPSY;  Surgeon: Kathi Der, MD;  Location: [facility] ENDOSCOPY;  Service: Gastroenterology;;   CARDIAC CATHETERIZATION N/A 08/09/2015   Procedure: Right/Left Heart Cath and Coronary Angiography;  Surgeon: Peter M Swaziland, MD;  Location: [facility] INVASIVE CV LAB;  Service: Cardiovascular;  Laterality: N/A;   CARDIAC CATHETERIZATION N/A 02/05/2016   Procedure: Right/Left Heart Cath and Coronary Angiography;  Surgeon: Corky Crafts, MD;  Location: [facility] INVASIVE CV LAB;  Service: Cardiovascular;  Laterality: N/A;   CARDIAC CATHETERIZATION N/A 04/17/2016   Procedure: Right Heart Cath;  Surgeon: Dolores Patty, MD;  Location: [facility] INVASIVE CV LAB;  Service: Cardiovascular;  Laterality: N/A;   CARDIOVASCULAR STRESS TEST  03/12/2002   EF 48%, NO EVIDENCE OF ISCHEMIA   CARDIOVERSION  01/2012; 03/31/2012   CARDIOVERSION N/A 02/25/2012  Procedure: CARDIOVERSION;  Surgeon: [name], MD;  Location: [facility] CATH LAB;  Service: Cardiovascular;  Laterality: N/A;   CARDIOVERSION N/A 03/31/2012   Procedure: CARDIOVERSION;  Surgeon: [name], MD;  Location: [facility] CATH LAB;  Service: Cardiovascular;  Laterality: N/A;   CARDIOVERSION N/A 11/23/2015   Procedure: CARDIOVERSION;  Surgeon: Laurey Morale, MD;  Location: [facility] ENDOSCOPY;  Service: Cardiovascular;  Laterality: N/A;   CARDIOVERSION N/A 04/08/2016   Procedure:  CARDIOVERSION;  Surgeon: Dolores Patty, MD;  Location: [facility] ENDOSCOPY;  Service: Cardiovascular;  Laterality: N/A;   CARDIOVERSION N/A 09/14/2018   Procedure: CARDIOVERSION;  Surgeon: Wendall Stade, MD;  Location: [facility] ENDOSCOPY;  Service: Cardiovascular;  Laterality: N/A;   CARDIOVERSION N/A 10/19/2019   Procedure: CARDIOVERSION;  Surgeon: Dolores Patty, MD;  Location: [facility] ENDOSCOPY;  Service: Cardiovascular;  Laterality: N/A;   CARDIOVERSION N/A 02/25/2020   Procedure: CARDIOVERSION;  Surgeon: Pricilla Riffle, MD;  Location: [facility] ENDOSCOPY;  Service: Cardiovascular;  Laterality: N/A;   CARDIOVERSION N/A 05/25/2020   Procedure: CARDIOVERSION;  Surgeon: Lewayne Bunting, MD;  Location: [facility] ENDOSCOPY;  Service: Cardiovascular;  Laterality: N/A;   CARDIOVERSION N/A 07/10/2020   Procedure: CARDIOVERSION;  Surgeon: Jake Bathe, MD;  Location: [facility] ENDOSCOPY;  Service: Cardiovascular;  Laterality: N/A;   CLIPPING OF ATRIAL APPENDAGE N/A 08/22/2015   Procedure: CLIPPING OF ATRIAL APPENDAGE;  Surgeon: Kerin Perna, MD;  Location: [facility] OR;  Service: Open Heart Surgery;  Laterality: N/A;   COLONOSCOPY WITH PROPOFOL N/A 01/01/2022   Procedure: COLONOSCOPY WITH PROPOFOL;  Surgeon: Kathi Der, MD;  Location: [facility] ENDOSCOPY;  Service: Gastroenterology;  Laterality: N/A;   DOPPLER ECHOCARDIOGRAPHY  03/11/2002   EF 70-75%   FINGER SURGERY Left    Middle finger   FINGER TENDON REPAIR  1980's   "right little finger" (03/31/2012)   FLEXIBLE SIGMOIDOSCOPY N/A 12/30/2021   Procedure: FLEXIBLE SIGMOIDOSCOPY;  Surgeon: Charlott Rakes, MD;  Location: [facility] ENDOSCOPY;  Service: Gastroenterology;  Laterality: N/A;   INSERT / REPLACE / REMOVE PACEMAKER     [employer] Jude   IR RADIOLOGIST EVAL & MGMT  05/06/2019   IR RADIOLOGIST EVAL & MGMT  10/06/2019   IR RADIOLOGIST EVAL & MGMT  10/31/2020   IR RADIOLOGIST EVAL & MGMT  01/18/2021   IR RADIOLOGIST EVAL & MGMT  03/08/2021   IR RADIOLOGIST EVAL & MGMT  06/13/2021    KIDNEY SURGERY Left    LEFT AND RIGHT HEART CATHETERIZATION WITH CORONARY ANGIOGRAM N/A 10/27/2012   Procedure: LEFT AND RIGHT HEART CATHETERIZATION WITH CORONARY ANGIOGRAM;  Surgeon: Pamella Pert, MD;  Location: [facility] CATH LAB;  Service: Cardiovascular;  Laterality: N/A;   LUMBAR DISC SURGERY  1980's X2;  2000's   MAZE N/A 08/22/2015   Procedure: MAZE;  Surgeon: Kerin Perna, MD;  Location: [facility] OR;  Service: Open Heart Surgery;  Laterality: N/A;   PACEMAKER INSERTION  03/31/2012   STJ Accent DR pacemaker implanted by Dr Johney Frame   PERMANENT PACEMAKER INSERTION N/A 03/31/2012   Procedure: PERMANENT PACEMAKER INSERTION;  Surgeon: [name], MD;  Location: [facility] CATH LAB;  Service: Cardiovascular;  Laterality: N/A;   PPM GENERATOR CHANGEOUT N/A 08/08/2020   Procedure: PPM GENERATOR CHANGEOUT;  Surgeon: [name], MD;  Location: [facility] INVASIVE CV LAB;  Service: Cardiovascular;  Laterality: N/A;   RADIOLOGY WITH ANESTHESIA Left 02/14/2021   Procedure: RADIOLOGY WITH ANESTHESIA LEFT RENAL CRYOABLATION;  Surgeon: Irish Lack, MD;  Location: [facility] ORS;  Service: Radiology;  Laterality: Left;   STUMP  REVISION Right 10/09/2022   Procedure: REVISION RIGHT BELOW KNEE AMPUTATION;  Surgeon: [name], MD;  Location: [facility] OR;  Service: Orthopedics;  Laterality: Right;   TEE WITHOUT CARDIOVERSION  01/30/2012   Procedure: TRANSESOPHAGEAL ECHOCARDIOGRAM (TEE);  Surgeon: Laurey Morale, MD;  Location: [facility] ENDOSCOPY;  Service: Cardiovascular;  Laterality: N/A;  ablation next day   TEE WITHOUT CARDIOVERSION N/A 08/22/2015   Procedure: TRANSESOPHAGEAL ECHOCARDIOGRAM (TEE);  Surgeon: Kerin Perna, MD;  Location: [facility] OR;  Service: Open Heart Surgery;  Laterality: N/A;   TEE WITHOUT CARDIOVERSION N/A 10/19/2019   Procedure: TRANSESOPHAGEAL ECHOCARDIOGRAM (TEE);  Surgeon: Dolores Patty, MD;  Location: [facility] ENDOSCOPY;  Service: Cardiovascular;  Laterality: N/A;   US ECHOCARDIOGRAPHY  08/07/2009   EF  55-60%   Social History   Socioeconomic History   Marital status: Married    Spouse name: Not on file   Number of children: Not on file   Years of education: Not on file   Highest education level: Not on file  Occupational History   Occupation: retired  Tobacco Use   Smoking status: Former    Current packs/day: 0.00    Average packs/day: 1 pack/day for 30.2 years (30.2 ttl pk-yrs)    Types: Cigarettes    Start date: 1963    Quit date: 07/18/1991    Years since quitting: 31.3    Passive exposure: Never   Smokeless tobacco: Never  Vaping Use   Vaping status: Never Used  Substance and Sexual Activity   Alcohol use: Not Currently    Comment: occasional   Drug use: Yes    Types: Marijuana    Comment: Marijuana- for nausea   Sexual activity: Not Currently  Other Topics Concern   Not on file  Social History Narrative   Lives in [place] with spouse.  Works as a Armed forces training and education officer.   Social Determinants of Health   Financial Resource Strain: Not on file  Food Insecurity: No Food Insecurity (10/17/2022)   Hunger Vital Sign    Worried About Running Out of Food in the Last Year: Never true    Ran Out of Food in the Last Year: Never true  Transportation Needs: No Transportation Needs (10/17/2022)   PRAPARE - Administrator, Civil Service (Medical): No    Lack of Transportation (Non-Medical): No  Physical Activity: Not on file  Stress: Not on file  Social Connections: Not on file   Family History  Problem Relation Age of Onset   Heart failure Mother    Stroke Father    Neuropathy Neg Hx    - negative except otherwise stated in the family history section Allergies  Allergen Reactions   Meloxicam Rash and Other (See Comments)    Red Man Syndrome, also   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 syndrome 09/02/15, resolved with diphenhydramine and slowing of rate   Prior to Admission medications   Medication Sig Start Date End Date Taking? Authorizing  Provider  albuterol (PROVENTIL HFA;VENTOLIN HFA) 108 (90 Base) MCG/ACT inhaler Inhale 2 puffs into the lungs every 6 (six) hours as needed for wheezing or shortness of breath. 12/28/15  Yes Leslye Peer, MD  amLODipine (NORVASC) 5 MG tablet Take 5 mg by mouth in the morning.   Yes [provider]  apixaban (ELIQUIS) 5 MG TABS tablet Take 1 tablet (5 mg total) by mouth 2 (two) times daily. 08/21/22  Yes Graciella Freer, PA-C  bisoprolol (ZEBETA) 5  MG tablet Take 5 mg by mouth daily.   Yes [provider]  Budeson-Glycopyrrol-Formoterol (BREZTRI AEROSPHERE) 160-9-4.8 MCG/ACT AERO Inhale 2 puffs into the lungs in the morning and at bedtime. 05/23/22  Yes Leslye Peer, MD  Cholecalciferol (VITAMIN D) 50 MCG (2000 UT) tablet Take 8,000 Units by mouth daily.   Yes [provider]  Dextromethorphan-guaiFENesin (MUCINEX DM MAXIMUM STRENGTH) 60-1200 MG TB12 Take 1 tablet by mouth daily.   Yes [provider]  doxycycline (VIBRA-TABS) 100 MG tablet Take 1 tablet (100 mg total) by mouth 2 (two) times daily. 11/12/22  Yes [name], MD  empagliflozin (JARDIANCE) 10 MG TABS tablet Take 1 tablet (10 mg total) by mouth daily. 05/27/22  Yes Bensimhon, Bevelyn Buckles, MD  gabapentin (NEURONTIN) 300 MG capsule Take 2 capsules (600 mg total) by mouth 3 (three) times daily. for neuropathy 10/19/22  Yes Rai, Ripudeep K, MD  hydrOXYzine (ATARAX) 10 MG tablet Take 1 tablet (10 mg total) by mouth 3 (three) times daily as needed. Patient taking differently: Take 10 mg by mouth 3 (three) times daily as needed for itching. 10/23/22  Yes Barnie Del R, NP  levothyroxine (SYNTHROID, LEVOTHROID) 175 MCG tablet Take 175 mcg by mouth daily before breakfast.   Yes [provider]  Menthol-Camphor (TIGER BALM ARTHRITIS RUB EX) Apply 1 application topically as needed (for leg pain).   Yes [provider]  montelukast (SINGULAIR) 10 MG tablet TAKE 1 TABLET(10 MG) BY MOUTH AT  BEDTIME Patient taking differently: Take 10 mg by mouth daily. 06/27/22  Yes Leslye Peer, MD  Multiple Minerals-Vitamins (CAL-MAG-ZINC-D) TABS Take 1 tablet by mouth daily.   Yes [provider]  Multiple Vitamin (MULTIVITAMIN WITH MINERALS) TABS tablet Take 1 tablet by mouth daily. 12/15/19  Yes Sheikh, Omair Latif, DO  ondansetron (ZOFRAN) 4 MG tablet Take 1 tablet (4 mg total) by mouth every 8 (eight) hours as needed for nausea or vomiting. 10/19/22  Yes Rai, Ripudeep K, MD  oxyCODONE-acetaminophen (PERCOCET/ROXICET) 5-325 MG tablet Take 1 tablet by mouth every 6 (six) hours as needed. Patient taking differently: Take 1 tablet by mouth every 6 (six) hours as needed for moderate pain. 11/01/22  Yes Barnie Del R, NP  OXYGEN Inhale 2 L/min into the lungs See admin instructions. 2 L/min at bedtime and 2 L/min throughout the day as needed for shortness of breath   Yes [provider]  pantoprazole (PROTONIX) 40 MG tablet Take 40 mg by mouth daily before breakfast.   Yes [provider]  Polyethyl Glyc-Propyl Glyc PF (SYSTANE ULTRA PF) 0.4-0.3 % SOLN Place 1 drop into both eyes 2 (two) times daily as needed (dry eyes).   Yes [provider]  polyethylene glycol powder (GLYCOLAX/MIRALAX) 17 GM/SCOOP powder Take 17 g by mouth daily as needed for mild constipation. Patient taking differently: Take 17 g by mouth daily. 01/23/20  Yes Glade Lloyd, MD  potassium chloride SA (KLOR-CON M) 20 MEQ tablet Take 20 mEq by mouth See admin instructions. Take 20 meq twice daily every other day   Yes [provider]  predniSONE (DELTASONE) 5 MG tablet Take 0.5 tablets (2.5 mg total) by mouth daily with breakfast. 10/22/21  Yes Byrum, Les Pou, MD  rOPINIRole (REQUIP) 1 MG tablet Take 1 tablet (1 mg total) by mouth in the morning and at bedtime. 01/23/20  Yes Glade Lloyd, MD  rosuvastatin (CRESTOR) 10 MG tablet Take 10 mg by mouth at bedtime.  08/08/16  Yes  [provider]  senna-docusate (SENOKOT-S) 8.6-50 MG tablet Take 1 tablet by mouth 2 (two) times daily.   Yes [provider]  spironolactone (ALDACTONE) 25 MG tablet Take 1 tablet (25 mg total) by mouth daily. 02/16/16  Yes Graciella Freer, PA-C  sucralfate (CARAFATE) 1 g tablet Take 1 g by mouth 3 (three) times daily as needed (Acid reflux). 03/27/22  Yes [provider]  torsemide (DEMADEX) 10 MG tablet Take 15 mg by mouth every other day.   Yes [provider]  traZODone (DESYREL) 50 MG tablet Take 50 mg by mouth at bedtime. 08/30/20  Yes [provider]   DG Chest 1 View  Result Date: 11/14/2022 CLINICAL DATA:  CHF. EXAM: CHEST  1 VIEW COMPARISON:  11/13/2022 FINDINGS: The cardiopericardial silhouette is within normal limits for size. Mild vascular congestion without overt airspace pulmonary edema. No pleural effusion or focal airspace consolidation. Left-sided permanent pacemaker again noted. No acute bony abnormality. IMPRESSION: Mild vascular congestion without overt airspace pulmonary edema. Electronically Signed   By: Kennith Center M.D.   On: 11/14/2022 06:07   CT Cervical Spine Wo Contrast  Result Date: 11/13/2022 CLINICAL DATA:  Trauma, pain EXAM: CT CERVICAL SPINE WITHOUT CONTRAST TECHNIQUE: Multidetector CT imaging of the cervical spine was performed without intravenous contrast. Multiplanar CT image reconstructions were also generated. RADIATION DOSE REDUCTION: This exam was performed according to the departmental dose-optimization program which includes automated exposure control, adjustment of the mA and/or kV according to patient size and/or use of iterative reconstruction technique. COMPARISON:  05/13/2022 FINDINGS: Alignment: Alignment of posterior margins of vertebral bodies is within normal limits. There is minimal dextroscoliosis. Skull base and vertebrae: No recent fracture is seen. Degenerative changes are noted, more so at C4-C5 and  C5-C6 levels. Soft tissues and spinal canal: There is no central spinal stenosis. Disc levels: There is moderate encroachment of left neural foramen at C2-C3 level. There is mild to moderate encroachment of left neural foramen at C3-C4 level. There is moderate to marked encroachment of neural foramina at C4-C5 and C5-C6 levels. Upper chest: Unremarkable. Other: Thyroid is difficult to visualize. IMPRESSION: No recent fracture is seen in cervical spine. Cervical spondylosis with encroachment of neural foramina at multiple levels as described in the body of the report. Electronically Signed   By: Ernie Avena M.D.   On: 11/13/2022 12:36   CT Head Wo Contrast  Result Date: 11/13/2022 CLINICAL DATA:  Trauma, fall EXAM: CT HEAD WITHOUT CONTRAST TECHNIQUE: Contiguous axial images were obtained from the base of the skull through the vertex without intravenous contrast. RADIATION DOSE REDUCTION: This exam was performed according to the departmental dose-optimization program which includes automated exposure control, adjustment of the mA and/or kV according to patient size and/or use of iterative reconstruction technique. COMPARISON:  10/17/2022 FINDINGS: Brain: No acute intracranial findings are seen. There are no signs of bleeding within the cranium. Cortical sulci are prominent. Vascular: Unremarkable. Skull: No fracture is seen in calvarium. Sinuses/Orbits: There is a mucous retention cyst in right maxillary sinus. There is mild medial bulging of inferior aspect of medial wall of left orbit, possibly residual change from remote injury. Other: None. IMPRESSION: No acute intracranial findings are seen in noncontrast CT brain. Atrophy. Electronically Signed   By: Ernie Avena M.D.   On: 11/13/2022 12:30   DG Knee 2 Views Right  Result Date: 11/13/2022 CLINICAL DATA:  Trauma, pain EXAM: RIGHT KNEE - 1-2 VIEW COMPARISON:  10/17/2022 FINDINGS: The previous  below-knee amputation in right lower leg. No  recent fracture or dislocation is seen. No focal erosive changes are noted. Minimal bony spurs are seen in better alignment. There is no significant effusion. Arterial calcifications are seen in soft tissues. IMPRESSION: Previous below-knee amputation in right lower leg. No recent fracture or dislocation is seen in right knee. Arteriosclerosis. Electronically Signed   By: Ernie Avena M.D.   On: 11/13/2022 12:27   DG Hand Complete Right  Result Date: 11/13/2022 CLINICAL DATA:  Trauma, fall EXAM: RIGHT HAND - COMPLETE 3+ VIEW COMPARISON:  None Available. FINDINGS: Monitoring devices is partly obscuring the middle and distal phalanges of index finger. No recent fracture or dislocation is seen. Degenerative changes are noted with bony spurs and multiple interphalangeal joints and metacarpophalangeal joints. Bony spurs are seen in first carpometacarpal joint. Deformity in the distal ulna may be residual from previous injury. Degenerative changes are noted in the inferior radioulnar joint. There is a thin linear radiopacity in the soft tissues in the tip of right thumb. IMPRESSION: No fracture or dislocation is seen. Degenerative changes are noted in multiple joints. There is small thin linear radiopacity in the soft tissues along the palmar aspect of tip of distal phalanx right thumb suggesting foreign body in the soft tissues. Electronically Signed   By: Ernie Avena M.D.   On: 11/13/2022 12:25   DG Pelvis Portable  Result Date: 11/13/2022 CLINICAL DATA:  Trauma, fall EXAM: PORTABLE PELVIS 1-2 VIEWS COMPARISON:  None Available. FINDINGS: There is no evidence of displaced pelvic fracture or diastasis. No pelvic bone lesions are seen. Nonobstructive pattern of overlying bowel gas. Large burden of stool. IMPRESSION: No displaced fracture of the pelvis or bilateral proximal femurs in single frontal view. Electronically Signed   By: Jearld Lesch M.D.   On: 11/13/2022 12:15   DG Chest Port 1  View  Result Date: 11/13/2022 CLINICAL DATA:  trauma EXAM: PORTABLE CHEST 1 VIEW COMPARISON:  CXR 10/17/22 FINDINGS: Left-sided dual lead cardiac device in place with unchanged lead positioning. Status post median sternotomy and aortic valve repair. Unchanged cardiac and mediastinal contours. No pleural effusion. No pneumothorax. No radiographically apparent displaced rib fractures. Visualized upper abdomen is unremarkable. Prominent bilateral interstitial opacities that could represent pulmonary venous congestion. IMPRESSION: Prominent bilateral interstitial opacities that could represent pulmonary venous congestion. Electronically Signed   By: Lorenza Cambridge M.D.   On: 11/13/2022 12:08   - pertinent xrays, CT, MRI studies were reviewed and independently interpreted  Positive ROS: All other systems have been reviewed and were otherwise negative with the exception of those mentioned in the HPI and as above.  Physical Exam: General: Alert, no acute distress Psychiatric: Patient is competent for consent with normal mood and affect Lymphatic: No axillary or cervical lymphadenopathy Cardiovascular: No pedal edema Respiratory: No cyanosis, no use of accessory musculature GI: No organomegaly, abdomen is soft and non-tender    Images:  @ENCIMAGES @  Labs:  Lab Results  Component Value Date   HGBA1C 6.3 (H) 11/13/2022   HGBA1C 6.5 (H) 12/27/2021   HGBA1C 6.6 (H) 06/11/2021   ESRSEDRATE 60 (H) 09/04/2015   REPTSTATUS 10/22/2022 FINAL 10/17/2022   GRAMSTAIN NO WBC SEEN NO ORGANISMS SEEN  10/09/2022   CULT  10/17/2022    NO GROWTH 5 DAYS Performed at [facility], [address], [place], [place] [address]    [facility] STAPHYLOCOCCUS AUREUS 10/09/2022   LABORGA STAPHYLOCOCCUS EPIDERMIDIS 10/09/2022    Lab Results  Component Value Date  ALBUMIN 3.9 11/13/2022   ALBUMIN 3.3 (L) 10/17/2022   ALBUMIN 3.6 09/04/2022   PREALBUMIN 25 09/04/2022        Latest Ref Rng & Units  11/14/2022    1:35 AM 11/13/2022   11:02 AM 10/18/2022    1:25 AM  CBC EXTENDED  WBC 4.0 - 10.5 K/uL 10.5  7.3  8.9   RBC 4.22 - 5.81 MIL/uL 4.62  4.14  3.57   Hemoglobin 13.0 - 17.0 g/dL 53.6  64.4  03.4   HCT 39.0 - 52.0 % 42.4  38.1  34.1   Platelets 150 - 400 K/uL 185  156  154     Neurologic: Patient does not have protective sensation bilateral lower extremities.   MUSCULOSKELETAL:   Skin: Examination there is dehiscence of the wound with exposed tibia.  No other injuries from his fall.  Assessment: Assessment: Traumatic dehiscence right below-knee amputation.  Plan: Will plan for revision amputation tomorrow.  Risk and benefits were discussed including risk of the wound not healing need for additional surgery.  Patient states he understands wished to proceed at this time.  Thank you for the consult and the opportunity to see Mr. Keona Sheffler, MD [facility] [phone] 7:32 AM

## 2022-11-14 NOTE — Progress Notes (Signed)
[facility] PROGRESS NOTE   [name] UXL:[id number] DOB: 11/22/1950 DOA: 11/13/2022  PCP: Alysia Penna, MD  Brief History/Interval Summary: 72 y.o. male with medical history significant of chronic A-fib on Eliquis, chronic HFpEF, IIDM, CKD stage II, HTN, PPM, COPD, HLD, hypothyroidism, diabetic osteomyelitis status post right BKA with chronic poor healing right leg stump wound, presented with fall and dehiscence of the right BKA wound.  Patient was hospitalized for further management.  Orthopedics was consulted.  Consultants: Orthopedics  Procedures: None yet    Subjective/Interval History: Patient does mention that his pain in the right stump is quite significant, 7 out of 10 in intensity.  Denies any nausea vomiting.  No fever or chills.    Assessment/Plan:  Right BKA Stump with wound dehiscence Patient with history of recurrent right BKA ulcer and infection.  Most recent wound culture from June showed MRSA and MSSA.  Patient was started on vancomycin.  Also noted to be on cefazolin. Dr. Lajoyce Corners with orthopedics has been consulted who plans for revision surgery tomorrow.  Eliquis on hold.  Pain control.  Acute on chronic diastolic CHF Thought to be in fluid overload yesterday.  Was given furosemide with good diuresis.  Denies any shortness of breath this morning.  Chest x-ray shows only vascular congestion without any clear pulmonary edema.  Hold off on further doses of furosemide for now.  Monitor volume status closely. Last echocardiogram is from February 2023 which showed normal LVEF.  Acute kidney injury on chronic kidney disease stage II Came in with creatinine of 1.82.  Noted to be 1.58 today.  Monitor urine output.  Avoid nephrotoxic agents. Baseline renal function seems to be around 1.5.  Paroxysmal atrial fibrillation In sinus rhythm currently.  Holding Eliquis for upcoming surgery.  Noted to be on bisoprolol which is being continued.  Essential  hypertension Noted to be on amlodipine.  Hypothyroidism Continue with levothyroxine.  Diabetes mellitus type 2 Monitor CBGs.  History of COPD on chronic steroids Stable.  Noted to be on steroids on a chronic basis which is being continued.  No evidence of exacerbation.  Continue with his inhalers.  Suspected foreign body in the right thumb This was incidentally noted on x-ray of the hand.  Nothing concerning noted on physical examination.  DVT Prophylaxis: Subcutaneous heparin will be held for now.  Can be started back on his Eliquis after surgery once cleared by orthopedics. Code Status: DNR Family Communication: Discussed with patient Disposition Plan: To be determined  Status is: Inpatient Remains inpatient appropriate because: BKA wound dehiscence requiring surgical intervention      Medications: Scheduled:  acidophilus  2 capsule Oral TID   amLODipine  5 mg Oral q AM   bisoprolol  5 mg Oral Daily   dextromethorphan-guaiFENesin  2 tablet Oral Daily   diphenhydrAMINE  25 mg Oral Q24H   fluticasone furoate-vilanterol  1 puff Inhalation Daily   And   umeclidinium bromide  1 puff Inhalation Daily   gabapentin  600 mg Oral TID   heparin  5,000 Units Subcutaneous Q12H   insulin aspart  0-9 Units Subcutaneous TID WC   levothyroxine  175 mcg Oral QAC breakfast   pantoprazole  40 mg Oral QAC breakfast   potassium chloride SA  20 mEq Oral 2 times per day every other day   predniSONE  2.5 mg Oral Q breakfast   rOPINIRole  1 mg Oral TID   rosuvastatin  10 mg Oral QHS   senna-docusate  1 tablet Oral BID   traZODone  50 mg Oral QHS   Continuous:   ceFAZolin (ANCEF) IV 2 g (11/14/22 0612)   vancomycin     MVH:QIONGEXBM, HYDROmorphone (DILAUDID) injection, hydrOXYzine, Muscle Rub, ondansetron, oxyCODONE, polyethylene glycol powder, polyvinyl alcohol, sucralfate  Antibiotics: Anti-infectives (From admission, onward)    Start     Dose/Rate Route Frequency Ordered Stop    11/14/22 1500  vancomycin (VANCOCIN) IVPB 1000 mg/200 mL premix       Placed in "And" Linked Group   1,000 mg 100 mL/hr over 120 Minutes Intravenous Every 24 hours 11/13/22 1606     11/13/22 2200  ceFAZolin (ANCEF) IVPB 2g/100 mL premix        2 g 200 mL/hr over 30 Minutes Intravenous Every 8 hours 11/13/22 1606     11/13/22 1345  ceFEPIme (MAXIPIME) 2 g in sodium chloride 0.9 % 100 mL IVPB        2 g 200 mL/hr over 30 Minutes Intravenous  Once 11/13/22 1336 11/13/22 1554   11/13/22 1345  metroNIDAZOLE (FLAGYL) IVPB 500 mg        500 mg 100 mL/hr over 60 Minutes Intravenous  Once 11/13/22 1336 11/13/22 2004   11/13/22 1330  vancomycin (VANCOREADY) IVPB 1750 mg/350 mL        1,750 mg 87.5 mL/hr over 240 Minutes Intravenous  Once 11/13/22 1328 11/13/22 2144       Objective:  Vital Signs  Vitals:   11/13/22 1604 11/13/22 2014 11/14/22 0412 11/14/22 0732  BP: (!) 131/92 125/83 (!) 141/81 138/80  Pulse: 65 (!) 59 (!) 59 (!) 59  Resp:  19 17   Temp:  98.7 F (37.1 C)  98 F (36.7 C)  TempSrc:  Oral  Oral  SpO2: 92% 100% 96% 100%  Weight:      Height:        Intake/Output Summary (Last 24 hours) at 11/14/2022 0953 Last data filed at 11/14/2022 0657 Gross per 24 hour  Intake 265.63 ml  Output 1550 ml  Net -1284.37 ml   Filed Weights   11/13/22 1113 11/13/22 1118  Weight: 74.8 kg 74.8 kg    General appearance: Awake alert.  In no distress Resp: Clear to auscultation bilaterally.  Normal effort Cardio: S1-S2 is normal regular.  No S3-S4.  No rubs murmurs or bruit GI: Abdomen is soft.  Nontender nondistended.  Bowel sounds are present normal.  No masses organomegaly Extremities: Right lower extremity stump is covered in dressing   Lab Results:  Data Reviewed: I have personally reviewed following labs and reports of the imaging studies  CBC: Recent Labs  Lab 11/13/22 1102 11/14/22 0135  WBC 7.3 10.5  HGB 11.8* 13.3  HCT 38.1* 42.4  MCV 92.0 91.8  PLT 156 185     Basic Metabolic Panel: Recent Labs  Lab 11/13/22 1102 11/14/22 0135  NA 133* 132*  K 4.4 4.6  CL 93* 93*  CO2 26 25  GLUCOSE 88 102*  BUN 21 18  CREATININE 1.82* 1.58*  CALCIUM 9.1 9.2    GFR: Estimated Creatinine Clearance: 44.1 mL/min (A) (by C-G formula based on SCr of 1.58 mg/dL (H)).  Liver Function Tests: Recent Labs  Lab 11/13/22 1102  AST 34  ALT 25  ALKPHOS 52  BILITOT 0.2*  PROT 7.1  ALBUMIN 3.9    HbA1C: Recent Labs    11/13/22 1805  HGBA1C 6.3*    CBG: Recent Labs  Lab 11/13/22 1627 11/13/22 1709 11/13/22  1838 11/13/22 2121 11/14/22 0733  GLUCAP 66* 68* 113* 134* 95    Radiology Studies: DG Chest 1 View  Result Date: 11/14/2022 CLINICAL DATA:  CHF. EXAM: CHEST  1 VIEW COMPARISON:  11/13/2022 FINDINGS: The cardiopericardial silhouette is within normal limits for size. Mild vascular congestion without overt airspace pulmonary edema. No pleural effusion or focal airspace consolidation. Left-sided permanent pacemaker again noted. No acute bony abnormality. IMPRESSION: Mild vascular congestion without overt airspace pulmonary edema. Electronically Signed   By: Kennith Center M.D.   On: 11/14/2022 06:07   CT Cervical Spine Wo Contrast  Result Date: 11/13/2022 CLINICAL DATA:  Trauma, pain EXAM: CT CERVICAL SPINE WITHOUT CONTRAST TECHNIQUE: Multidetector CT imaging of the cervical spine was performed without intravenous contrast. Multiplanar CT image reconstructions were also generated. RADIATION DOSE REDUCTION: This exam was performed according to the departmental dose-optimization program which includes automated exposure control, adjustment of the mA and/or kV according to patient size and/or use of iterative reconstruction technique. COMPARISON:  05/13/2022 FINDINGS: Alignment: Alignment of posterior margins of vertebral bodies is within normal limits. There is minimal dextroscoliosis. Skull base and vertebrae: No recent fracture is seen.  Degenerative changes are noted, more so at C4-C5 and C5-C6 levels. Soft tissues and spinal canal: There is no central spinal stenosis. Disc levels: There is moderate encroachment of left neural foramen at C2-C3 level. There is mild to moderate encroachment of left neural foramen at C3-C4 level. There is moderate to marked encroachment of neural foramina at C4-C5 and C5-C6 levels. Upper chest: Unremarkable. Other: Thyroid is difficult to visualize. IMPRESSION: No recent fracture is seen in cervical spine. Cervical spondylosis with encroachment of neural foramina at multiple levels as described in the body of the report. Electronically Signed   By: Ernie Avena M.D.   On: 11/13/2022 12:36   CT Head Wo Contrast  Result Date: 11/13/2022 CLINICAL DATA:  Trauma, fall EXAM: CT HEAD WITHOUT CONTRAST TECHNIQUE: Contiguous axial images were obtained from the base of the skull through the vertex without intravenous contrast. RADIATION DOSE REDUCTION: This exam was performed according to the departmental dose-optimization program which includes automated exposure control, adjustment of the mA and/or kV according to patient size and/or use of iterative reconstruction technique. COMPARISON:  10/17/2022 FINDINGS: Brain: No acute intracranial findings are seen. There are no signs of bleeding within the cranium. Cortical sulci are prominent. Vascular: Unremarkable. Skull: No fracture is seen in calvarium. Sinuses/Orbits: There is a mucous retention cyst in right maxillary sinus. There is mild medial bulging of inferior aspect of medial wall of left orbit, possibly residual change from remote injury. Other: None. IMPRESSION: No acute intracranial findings are seen in noncontrast CT brain. Atrophy. Electronically Signed   By: Ernie Avena M.D.   On: 11/13/2022 12:30   DG Knee 2 Views Right  Result Date: 11/13/2022 CLINICAL DATA:  Trauma, pain EXAM: RIGHT KNEE - 1-2 VIEW COMPARISON:  10/17/2022 FINDINGS: The  previous below-knee amputation in right lower leg. No recent fracture or dislocation is seen. No focal erosive changes are noted. Minimal bony spurs are seen in better alignment. There is no significant effusion. Arterial calcifications are seen in soft tissues. IMPRESSION: Previous below-knee amputation in right lower leg. No recent fracture or dislocation is seen in right knee. Arteriosclerosis. Electronically Signed   By: Ernie Avena M.D.   On: 11/13/2022 12:27   DG Hand Complete Right  Result Date: 11/13/2022 CLINICAL DATA:  Trauma, fall EXAM: RIGHT HAND - COMPLETE 3+ VIEW COMPARISON:  None Available. FINDINGS: Monitoring devices is partly obscuring the middle and distal phalanges of index finger. No recent fracture or dislocation is seen. Degenerative changes are noted with bony spurs and multiple interphalangeal joints and metacarpophalangeal joints. Bony spurs are seen in first carpometacarpal joint. Deformity in the distal ulna may be residual from previous injury. Degenerative changes are noted in the inferior radioulnar joint. There is a thin linear radiopacity in the soft tissues in the tip of right thumb. IMPRESSION: No fracture or dislocation is seen. Degenerative changes are noted in multiple joints. There is small thin linear radiopacity in the soft tissues along the palmar aspect of tip of distal phalanx right thumb suggesting foreign body in the soft tissues. Electronically Signed   By: Ernie Avena M.D.   On: 11/13/2022 12:25   DG Pelvis Portable  Result Date: 11/13/2022 CLINICAL DATA:  Trauma, fall EXAM: PORTABLE PELVIS 1-2 VIEWS COMPARISON:  None Available. FINDINGS: There is no evidence of displaced pelvic fracture or diastasis. No pelvic bone lesions are seen. Nonobstructive pattern of overlying bowel gas. Large burden of stool. IMPRESSION: No displaced fracture of the pelvis or bilateral proximal femurs in single frontal view. Electronically Signed   By: Jearld Lesch  M.D.   On: 11/13/2022 12:15   DG Chest Port 1 View  Result Date: 11/13/2022 CLINICAL DATA:  trauma EXAM: PORTABLE CHEST 1 VIEW COMPARISON:  CXR 10/17/22 FINDINGS: Left-sided dual lead cardiac device in place with unchanged lead positioning. Status post median sternotomy and aortic valve repair. Unchanged cardiac and mediastinal contours. No pleural effusion. No pneumothorax. No radiographically apparent displaced rib fractures. Visualized upper abdomen is unremarkable. Prominent bilateral interstitial opacities that could represent pulmonary venous congestion. IMPRESSION: Prominent bilateral interstitial opacities that could represent pulmonary venous congestion. Electronically Signed   By: Lorenza Cambridge M.D.   On: 11/13/2022 12:08       LOS: 1 day   Malaki Koury  [facility] Pager on [email or web address]  11/14/2022, 9:53 AM

## 2022-11-14 NOTE — TOC CAGE-AID Note (Signed)
Transition of Care [facility]) - CAGE-AID Screening   Patient Details  Name: [name] MRN: [id number] Date of Birth: 10-28-1950  [facility] of [facility]) CM/SW Contact:    Katha Hamming, RN Phone Number: 11/14/2022, 7:35 PM    CAGE-AID Screening:    Have You Ever Felt You Ought to Cut Down on Your Drinking or Drug Use?: No Have People Annoyed You By Critizing Your Drinking Or Drug Use?: No Have You Felt Bad Or Guilty About Your Drinking Or Drug Use?: No Have You Ever Had a Drink or Used Drugs First Thing In The Morning to Steady Your Nerves or to Get Rid of a Hangover?: No CAGE-AID Score: 0  Substance Abuse Education Offered: No

## 2022-11-14 NOTE — Progress Notes (Signed)
Office Visit Note   Patient: [name]           Date of Birth: 1950/09/19           MRN: [id number] Visit Date: 11/12/2022              Requested by: Alysia Penna, MD [address] [place],  [place] [address] PCP: Alysia Penna, MD  Chief Complaint  Patient presents with   Right Leg - Routine Post Op    10/09/2022 revision right BKA      HPI: Patient is a 72 year old gentleman who presents 4 weeks status post revision right below-knee amputation.  Assessment & Plan: Visit Diagnoses:  1. S/P BKA (below knee amputation), right (HCC)   2. Dehiscence of amputation stump of right lower extremity (HCC)     Plan: Patient has a small area of wound dehiscence.  This was filled with Kerecis micro graft donated.  Prescription for doxycycline called in.  Follow-Up Instructions: Return in about 1 week (around 11/19/2022).   Ortho Exam  Patient is alert, oriented, no adenopathy, well-dressed, normal affect, normal respiratory effort. Examination patient has a small area of wound dehiscence 2 x 3 cm and 1 cm deep.  After gentle debridement there is healthy granulation tissue.  The wound bed was filled with Kerecis micro graft donated.  Imaging: DG Chest 1 View  Result Date: 11/14/2022 CLINICAL DATA:  CHF. EXAM: CHEST  1 VIEW COMPARISON:  11/13/2022 FINDINGS: The cardiopericardial silhouette is within normal limits for size. Mild vascular congestion without overt airspace pulmonary edema. No pleural effusion or focal airspace consolidation. Left-sided permanent pacemaker again noted. No acute bony abnormality. IMPRESSION: Mild vascular congestion without overt airspace pulmonary edema. Electronically Signed   By: Kennith Center M.D.   On: 11/14/2022 06:07   CT Cervical Spine Wo Contrast  Result Date: 11/13/2022 CLINICAL DATA:  Trauma, pain EXAM: CT CERVICAL SPINE WITHOUT CONTRAST TECHNIQUE: Multidetector CT imaging of the cervical spine was performed without intravenous  contrast. Multiplanar CT image reconstructions were also generated. RADIATION DOSE REDUCTION: This exam was performed according to the departmental dose-optimization program which includes automated exposure control, adjustment of the mA and/or kV according to patient size and/or use of iterative reconstruction technique. COMPARISON:  05/13/2022 FINDINGS: Alignment: Alignment of posterior margins of vertebral bodies is within normal limits. There is minimal dextroscoliosis. Skull base and vertebrae: No recent fracture is seen. Degenerative changes are noted, more so at C4-C5 and C5-C6 levels. Soft tissues and spinal canal: There is no central spinal stenosis. Disc levels: There is moderate encroachment of left neural foramen at C2-C3 level. There is mild to moderate encroachment of left neural foramen at C3-C4 level. There is moderate to marked encroachment of neural foramina at C4-C5 and C5-C6 levels. Upper chest: Unremarkable. Other: Thyroid is difficult to visualize. IMPRESSION: No recent fracture is seen in cervical spine. Cervical spondylosis with encroachment of neural foramina at multiple levels as described in the body of the report. Electronically Signed   By: Ernie Avena M.D.   On: 11/13/2022 12:36   CT Head Wo Contrast  Result Date: 11/13/2022 CLINICAL DATA:  Trauma, fall EXAM: CT HEAD WITHOUT CONTRAST TECHNIQUE: Contiguous axial images were obtained from the base of the skull through the vertex without intravenous contrast. RADIATION DOSE REDUCTION: This exam was performed according to the departmental dose-optimization program which includes automated exposure control, adjustment of the mA and/or kV according to patient size and/or use of iterative reconstruction technique. COMPARISON:  10/17/2022 FINDINGS: Brain: No acute intracranial findings are seen. There are no signs of bleeding within the cranium. Cortical sulci are prominent. Vascular: Unremarkable. Skull: No fracture is seen in  calvarium. Sinuses/Orbits: There is a mucous retention cyst in right maxillary sinus. There is mild medial bulging of inferior aspect of medial wall of left orbit, possibly residual change from remote injury. Other: None. IMPRESSION: No acute intracranial findings are seen in noncontrast CT brain. Atrophy. Electronically Signed   By: Ernie Avena M.D.   On: 11/13/2022 12:30   DG Knee 2 Views Right  Result Date: 11/13/2022 CLINICAL DATA:  Trauma, pain EXAM: RIGHT KNEE - 1-2 VIEW COMPARISON:  10/17/2022 FINDINGS: The previous below-knee amputation in right lower leg. No recent fracture or dislocation is seen. No focal erosive changes are noted. Minimal bony spurs are seen in better alignment. There is no significant effusion. Arterial calcifications are seen in soft tissues. IMPRESSION: Previous below-knee amputation in right lower leg. No recent fracture or dislocation is seen in right knee. Arteriosclerosis. Electronically Signed   By: Ernie Avena M.D.   On: 11/13/2022 12:27   DG Hand Complete Right  Result Date: 11/13/2022 CLINICAL DATA:  Trauma, fall EXAM: RIGHT HAND - COMPLETE 3+ VIEW COMPARISON:  None Available. FINDINGS: Monitoring devices is partly obscuring the middle and distal phalanges of index finger. No recent fracture or dislocation is seen. Degenerative changes are noted with bony spurs and multiple interphalangeal joints and metacarpophalangeal joints. Bony spurs are seen in first carpometacarpal joint. Deformity in the distal ulna may be residual from previous injury. Degenerative changes are noted in the inferior radioulnar joint. There is a thin linear radiopacity in the soft tissues in the tip of right thumb. IMPRESSION: No fracture or dislocation is seen. Degenerative changes are noted in multiple joints. There is small thin linear radiopacity in the soft tissues along the palmar aspect of tip of distal phalanx right thumb suggesting foreign body in the soft tissues.  Electronically Signed   By: Ernie Avena M.D.   On: 11/13/2022 12:25   DG Pelvis Portable  Result Date: 11/13/2022 CLINICAL DATA:  Trauma, fall EXAM: PORTABLE PELVIS 1-2 VIEWS COMPARISON:  None Available. FINDINGS: There is no evidence of displaced pelvic fracture or diastasis. No pelvic bone lesions are seen. Nonobstructive pattern of overlying bowel gas. Large burden of stool. IMPRESSION: No displaced fracture of the pelvis or bilateral proximal femurs in single frontal view. Electronically Signed   By: Jearld Lesch M.D.   On: 11/13/2022 12:15   DG Chest Port 1 View  Result Date: 11/13/2022 CLINICAL DATA:  trauma EXAM: PORTABLE CHEST 1 VIEW COMPARISON:  CXR 10/17/22 FINDINGS: Left-sided dual lead cardiac device in place with unchanged lead positioning. Status post median sternotomy and aortic valve repair. Unchanged cardiac and mediastinal contours. No pleural effusion. No pneumothorax. No radiographically apparent displaced rib fractures. Visualized upper abdomen is unremarkable. Prominent bilateral interstitial opacities that could represent pulmonary venous congestion. IMPRESSION: Prominent bilateral interstitial opacities that could represent pulmonary venous congestion. Electronically Signed   By: Lorenza Cambridge M.D.   On: 11/13/2022 12:08     Labs: Lab Results  Component Value Date   HGBA1C 6.3 (H) 11/13/2022   HGBA1C 6.5 (H) 12/27/2021   HGBA1C 6.6 (H) 06/11/2021   ESRSEDRATE 60 (H) 09/04/2015   REPTSTATUS 10/22/2022 FINAL 10/17/2022   GRAMSTAIN NO WBC SEEN NO ORGANISMS SEEN  10/09/2022   CULT  10/17/2022    NO GROWTH 5 DAYS Performed at [facility]  Hospital Lab, [address], [place], [place] [address]    [facility] STAPHYLOCOCCUS AUREUS 10/09/2022   LABORGA STAPHYLOCOCCUS EPIDERMIDIS 10/09/2022     Lab Results  Component Value Date   ALBUMIN 3.9 11/13/2022   ALBUMIN 3.3 (L) 10/17/2022   ALBUMIN 3.6 09/04/2022   PREALBUMIN 25 09/04/2022    Lab Results  Component Value  Date   MG 2.0 06/11/2021   MG 2.3 12/10/2020   MG 2.4 05/29/2020   No results found for: "VD25OH"  Lab Results  Component Value Date   PREALBUMIN 25 09/04/2022      Latest Ref Rng & Units 11/14/2022    1:35 AM 11/13/2022   11:02 AM 10/18/2022    1:25 AM  CBC EXTENDED  WBC 4.0 - 10.5 K/uL 10.5  7.3  8.9   RBC 4.22 - 5.81 MIL/uL 4.62  4.14  3.57   Hemoglobin 13.0 - 17.0 g/dL 91.4  78.2  95.6   HCT 39.0 - 52.0 % 42.4  38.1  34.1   Platelets 150 - 400 K/uL 185  156  154      There is no height or weight on file to calculate BMI.  Orders:  No orders of the defined types were placed in this encounter.  Meds ordered this encounter  Medications   doxycycline (VIBRA-TABS) 100 MG tablet    Sig: Take 1 tablet (100 mg total) by mouth 2 (two) times daily.    Dispense:  60 tablet    Refill:  0     Procedures: No procedures performed  Clinical Data: No additional findings.  ROS:  All other systems negative, except as noted in the HPI. Review of Systems  Objective: Vital Signs: There were no vitals taken for this visit.  Specialty Comments:  No specialty comments available.  PMFS History: Patient Active Problem List   Diagnosis Date Noted   Diabetic ulcer of lower leg (HCC) 11/13/2022   Osteomyelitis (HCC) 11/13/2022   Dyslipidemia 10/18/2022   Cellulitis of right lower extremity 10/17/2022   Dehiscence of amputation stump of right lower extremity (HCC) 10/09/2022   Below-knee amputation of right lower extremity (HCC) 10/09/2022   Gangrene of right foot (HCC) 09/04/2022   S/P BKA (below knee amputation), right (HCC) 09/04/2022   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) 08/23/2022   CAP (community acquired pneumonia) 12/28/2021   Constipation 12/28/2021   Pain in both lower extremities 07/03/2021   Peripheral neuropathy 07/03/2021   Acute metabolic encephalopathy 06/09/2021   Marijuana dependence (HCC) 06/09/2021   DNR (do not resuscitate) 06/09/2021    Chest pain 06/08/2021   Renal lesion 02/14/2021   Status post cryoablation 02/14/2021   Allergic rhinitis 12/08/2020   Preop pulmonary/respiratory exam 03/27/2020   Acute on chronic diastolic (congestive) heart failure (HCC) 01/22/2020   Abdominal pain 01/22/2020   Hypothyroidism 12/13/2019   DM2 (diabetes mellitus, type 2) (HCC) 12/13/2019   Chronic kidney disease, stage 3b (HCC) 12/13/2019   Acute bacterial bronchitis 12/13/2019   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   Typical atrial flutter (HCC)    Chronic respiratory failure with hypoxia (HCC) 05/23/2016   CAD (coronary artery disease) 05/23/2016   Persistent atrial fibrillation (HCC)    Atypical atrial flutter (HCC)    Visit for monitoring Tikosyn therapy 04/16/2016   Chronic diastolic CHF (congestive heart failure) (HCC) 02/03/2016   Cough 12/06/2015   Acute on chronic respiratory failure with hypoxia (HCC) 09/08/2015   Hallucinations 09/04/2015   Lymphadenopathy 09/04/2015  Ascending aortic aneurysm (HCC) 09/04/2015   Hypoxia 09/02/2015   S/P aortic valve replacement with bioprosthetic valve 09/02/2015   Pleural effusion 09/02/2015   S/P AVR 08/22/2015   GERD without esophagitis 09/20/2014   Intrinsic asthma 10/13/2013   Dyspnea 09/15/2013   Pulmonary hypertension (HCC) 09/15/2013   Shortness of breath 05/05/2013   Pacemaker-St.Jude 04/01/2012   Bradycardia 03/31/2012   Second degree AV block 03/31/2012   AV block, 1st degree 02/01/2012   PAF (paroxysmal atrial fibrillation) (HCC) 11/13/2011   Fatigue 11/13/2011   Essential hypertension 11/13/2011   Aortic stenosis 11/13/2011   Sleep apnea 11/13/2011   Past Medical History:  Diagnosis Date   Aortic stenosis, mild    Arthritis of hand    "just a little bit in both hands" (03/31/2012)   Asthma    "little bit" (03/31/2012)   Atrial fibrillation (HCC)    dx '04; DCCV '04, placed on flecainide, failed DCCV 04/2010, flecainide stopped; s/p successful A.fib ablation  01/31/12   CHF (congestive heart failure) (HCC)    Controlled type 2 diabetes mellitus without complication, without long-term current use of insulin (HCC) 12/13/2019   COPD (chronic obstructive pulmonary disease) (HCC)    DJD (degenerative joint disease)    Fatty liver    Fibromyalgia    GERD (gastroesophageal reflux disease)    Hyperlipidemia    Hypertension    Hypothyroidism    S/P radiation   Left atrial enlargement    LA size 45mm by echo 11/21/11   Mitral regurgitation    trivial   Obstructive sleep apnea    mild by sleep study 2013; pt stated he does not have a machine because "it wasn't bad enough for him to have one"   Pacemaker 03/31/2012   Pneumonia    last time 2023   Restless leg syndrome    Second degree AV block    Splenomegaly     Family History  Problem Relation Age of Onset   Heart failure Mother    Stroke Father    Neuropathy Neg Hx     Past Surgical History:  Procedure Laterality Date   AMPUTATION Right 08/23/2022   Procedure: RIGHT FOOT FIFTH RAY AMPUTATION;  Surgeon: [name], MD;  Location: [facility] OR;  Service: Orthopedics;  Laterality: Right;   AMPUTATION Right 09/04/2022   Procedure: RIGHT BELOW KNEE AMPUTATION;  Surgeon: [name], MD;  Location: [facility] OR;  Service: Orthopedics;  Laterality: Right;   AORTIC VALVE REPLACEMENT N/A 08/22/2015   Procedure: AORTIC VALVE REPLACEMENT (AVR);  Surgeon: Kerin Perna, MD;  Location: [facility] OR;  Service: Open Heart Surgery;  Laterality: N/A;   ATRIAL FIBRILLATION ABLATION  01/30/2012   PVI by Dr. Johney Frame   ATRIAL FIBRILLATION ABLATION N/A 01/31/2012   Procedure: ATRIAL FIBRILLATION ABLATION;  Surgeon: [name], MD;  Location: [facility] CATH LAB;  Service: Cardiovascular;  Laterality: N/A;   ATRIAL FIBRILLATION ABLATION N/A 04/19/2020   Procedure: ATRIAL FIBRILLATION ABLATION;  Surgeon: [name], MD;  Location: [facility] INVASIVE CV LAB;  Service: Cardiovascular;  Laterality: N/A;   BACK SURGERY     X 3   BIOPSY   01/01/2022   Procedure: BIOPSY;  Surgeon: Kathi Der, MD;  Location: [facility] ENDOSCOPY;  Service: Gastroenterology;;   CARDIAC CATHETERIZATION N/A 08/09/2015   Procedure: Right/Left Heart Cath and Coronary Angiography;  Surgeon: Peter M Swaziland, MD;  Location: [facility] INVASIVE CV LAB;  Service: Cardiovascular;  Laterality: N/A;   CARDIAC CATHETERIZATION N/A 02/05/2016   Procedure: Right/Left  Heart Cath and Coronary Angiography;  Surgeon: Corky Crafts, MD;  Location: [facility] INVASIVE CV LAB;  Service: Cardiovascular;  Laterality: N/A;   CARDIAC CATHETERIZATION N/A 04/17/2016   Procedure: Right Heart Cath;  Surgeon: Dolores Patty, MD;  Location: [facility] INVASIVE CV LAB;  Service: Cardiovascular;  Laterality: N/A;   CARDIOVASCULAR STRESS TEST  03/12/2002   EF 48%, NO EVIDENCE OF ISCHEMIA   CARDIOVERSION  01/2012; 03/31/2012   CARDIOVERSION N/A 02/25/2012   Procedure: CARDIOVERSION;  Surgeon: [name], MD;  Location: [facility] CATH LAB;  Service: Cardiovascular;  Laterality: N/A;   CARDIOVERSION N/A 03/31/2012   Procedure: CARDIOVERSION;  Surgeon: [name], MD;  Location: [facility] CATH LAB;  Service: Cardiovascular;  Laterality: N/A;   CARDIOVERSION N/A 11/23/2015   Procedure: CARDIOVERSION;  Surgeon: Laurey Morale, MD;  Location: [facility] ENDOSCOPY;  Service: Cardiovascular;  Laterality: N/A;   CARDIOVERSION N/A 04/08/2016   Procedure: CARDIOVERSION;  Surgeon: Dolores Patty, MD;  Location: [facility] ENDOSCOPY;  Service: Cardiovascular;  Laterality: N/A;   CARDIOVERSION N/A 09/14/2018   Procedure: CARDIOVERSION;  Surgeon: Wendall Stade, MD;  Location: [facility] ENDOSCOPY;  Service: Cardiovascular;  Laterality: N/A;   CARDIOVERSION N/A 10/19/2019   Procedure: CARDIOVERSION;  Surgeon: Dolores Patty, MD;  Location: [facility] ENDOSCOPY;  Service: Cardiovascular;  Laterality: N/A;   CARDIOVERSION N/A 02/25/2020   Procedure: CARDIOVERSION;  Surgeon: Pricilla Riffle, MD;  Location: [facility] ENDOSCOPY;  Service: Cardiovascular;   Laterality: N/A;   CARDIOVERSION N/A 05/25/2020   Procedure: CARDIOVERSION;  Surgeon: Lewayne Bunting, MD;  Location: [facility] ENDOSCOPY;  Service: Cardiovascular;  Laterality: N/A;   CARDIOVERSION N/A 07/10/2020   Procedure: CARDIOVERSION;  Surgeon: Jake Bathe, MD;  Location: [facility] ENDOSCOPY;  Service: Cardiovascular;  Laterality: N/A;   CLIPPING OF ATRIAL APPENDAGE N/A 08/22/2015   Procedure: CLIPPING OF ATRIAL APPENDAGE;  Surgeon: Kerin Perna, MD;  Location: [facility] OR;  Service: Open Heart Surgery;  Laterality: N/A;   COLONOSCOPY WITH PROPOFOL N/A 01/01/2022   Procedure: COLONOSCOPY WITH PROPOFOL;  Surgeon: Kathi Der, MD;  Location: [facility] ENDOSCOPY;  Service: Gastroenterology;  Laterality: N/A;   DOPPLER ECHOCARDIOGRAPHY  03/11/2002   EF 70-75%   FINGER SURGERY Left    Middle finger   FINGER TENDON REPAIR  1980's   "right little finger" (03/31/2012)   FLEXIBLE SIGMOIDOSCOPY N/A 12/30/2021   Procedure: FLEXIBLE SIGMOIDOSCOPY;  Surgeon: Charlott Rakes, MD;  Location: [facility] ENDOSCOPY;  Service: Gastroenterology;  Laterality: N/A;   INSERT / REPLACE / REMOVE PACEMAKER     St Jude   IR RADIOLOGIST EVAL & MGMT  05/06/2019   IR RADIOLOGIST EVAL & MGMT  10/06/2019   IR RADIOLOGIST EVAL & MGMT  10/31/2020   IR RADIOLOGIST EVAL & MGMT  01/18/2021   IR RADIOLOGIST EVAL & MGMT  03/08/2021   IR RADIOLOGIST EVAL & MGMT  06/13/2021   KIDNEY SURGERY Left    LEFT AND RIGHT HEART CATHETERIZATION WITH CORONARY ANGIOGRAM N/A 10/27/2012   Procedure: LEFT AND RIGHT HEART CATHETERIZATION WITH CORONARY ANGIOGRAM;  Surgeon: Pamella Pert, MD;  Location: [facility] CATH LAB;  Service: Cardiovascular;  Laterality: N/A;   LUMBAR DISC SURGERY  1980's X2;  2000's   MAZE N/A 08/22/2015   Procedure: MAZE;  Surgeon: Kerin Perna, MD;  Location: [facility] OR;  Service: Open Heart Surgery;  Laterality: N/A;   PACEMAKER INSERTION  03/31/2012   STJ Accent DR pacemaker implanted by Dr Johney Frame   PERMANENT PACEMAKER INSERTION N/A  03/31/2012   Procedure: PERMANENT PACEMAKER  INSERTION;  Surgeon: [name], MD;  Location: [facility] CATH LAB;  Service: Cardiovascular;  Laterality: N/A;   PPM GENERATOR CHANGEOUT N/A 08/08/2020   Procedure: PPM GENERATOR CHANGEOUT;  Surgeon: [name], MD;  Location: [facility] INVASIVE CV LAB;  Service: Cardiovascular;  Laterality: N/A;   RADIOLOGY WITH ANESTHESIA Left 02/14/2021   Procedure: RADIOLOGY WITH ANESTHESIA LEFT RENAL CRYOABLATION;  Surgeon: Irish Lack, MD;  Location: [facility] ORS;  Service: Radiology;  Laterality: Left;   STUMP REVISION Right 10/09/2022   Procedure: REVISION RIGHT BELOW KNEE AMPUTATION;  Surgeon: [name], MD;  Location: [facility] OR;  Service: Orthopedics;  Laterality: Right;   TEE WITHOUT CARDIOVERSION  01/30/2012   Procedure: TRANSESOPHAGEAL ECHOCARDIOGRAM (TEE);  Surgeon: Laurey Morale, MD;  Location: [facility] ENDOSCOPY;  Service: Cardiovascular;  Laterality: N/A;  ablation next day   TEE WITHOUT CARDIOVERSION N/A 08/22/2015   Procedure: TRANSESOPHAGEAL ECHOCARDIOGRAM (TEE);  Surgeon: Kerin Perna, MD;  Location: [facility] OR;  Service: Open Heart Surgery;  Laterality: N/A;   TEE WITHOUT CARDIOVERSION N/A 10/19/2019   Procedure: TRANSESOPHAGEAL ECHOCARDIOGRAM (TEE);  Surgeon: Dolores Patty, MD;  Location: [facility] ENDOSCOPY;  Service: Cardiovascular;  Laterality: N/A;   US ECHOCARDIOGRAPHY  08/07/2009   EF 55-60%   Social History   Occupational History   Occupation: retired  Tobacco Use   Smoking status: Former    Current packs/day: 0.00    Average packs/day: 1 pack/day for 30.2 years (30.2 ttl pk-yrs)    Types: Cigarettes    Start date: 1963    Quit date: 07/18/1991    Years since quitting: 31.3    Passive exposure: Never   Smokeless tobacco: Never  Vaping Use   Vaping status: Never Used  Substance and Sexual Activity   Alcohol use: Not Currently    Comment: occasional   Drug use: Yes    Types: Marijuana    Comment: Marijuana- for nausea   Sexual activity: Not  Currently

## 2022-11-14 NOTE — Plan of Care (Signed)
  Problem: Education: Goal: Ability to describe self-care measures that may prevent or decrease complications (Diabetes Survival Skills Education) will improve Outcome: Not Progressing Goal: Individualized Educational Video(s) Outcome: Not Progressing   Problem: Coping: Goal: Ability to adjust to condition or change in health will improve Outcome: Not Progressing   Problem: Fluid Volume: Goal: Ability to maintain a balanced intake and output will improve Outcome: Not Progressing   Problem: Health Behavior/Discharge Planning: Goal: Ability to identify and utilize available resources and services will improve Outcome: Not Progressing Goal: Ability to manage health-related needs will improve Outcome: Not Progressing   Problem: Metabolic: Goal: Ability to maintain appropriate glucose levels will improve Outcome: Not Progressing   

## 2022-11-15 DIAGNOSIS — I480 Paroxysmal atrial fibrillation: Secondary | ICD-10-CM | POA: Diagnosis not present

## 2022-11-15 DIAGNOSIS — I5033 Acute on chronic diastolic (congestive) heart failure: Secondary | ICD-10-CM | POA: Diagnosis not present

## 2022-11-15 DIAGNOSIS — T8781 Dehiscence of amputation stump: Secondary | ICD-10-CM | POA: Diagnosis not present

## 2022-11-15 LAB — BASIC METABOLIC PANEL
Anion gap: 13 (ref 5–15)
BUN: 19 mg/dL (ref 8–23)
CO2: 26 mmol/L (ref 22–32)
Calcium: 9.2 mg/dL (ref 8.9–10.3)
Chloride: 92 mmol/L — ABNORMAL LOW (ref 98–111)
Creatinine, Ser: 1.67 mg/dL — ABNORMAL HIGH (ref 0.61–1.24)
GFR, Estimated: 43 mL/min — ABNORMAL LOW (ref >60)
Glucose, Bld: 86 mg/dL (ref 70–99)
Potassium: 4.5 mmol/L (ref 3.5–5.1)
Sodium: 131 mmol/L — ABNORMAL LOW (ref 135–145)

## 2022-11-15 LAB — GLUCOSE, CAPILLARY
Glucose-Capillary: 85 mg/dL (ref 70–99)
Glucose-Capillary: 142 mg/dL — ABNORMAL HIGH (ref 70–99)
Glucose-Capillary: 116 mg/dL — ABNORMAL HIGH (ref 70–99)
Glucose-Capillary: 99 mg/dL (ref 70–99)

## 2022-11-15 LAB — SURGICAL PCR SCREEN
MRSA, PCR: NEGATIVE
Staphylococcus aureus: NEGATIVE

## 2022-11-15 MED ORDER — TRANEXAMIC ACID 1000 MG/10ML IV SOLN
2000.0000 mg | INTRAVENOUS | Status: DC
  Filled 2022-11-15: qty 20

## 2022-11-15 MED ORDER — HEPARIN SODIUM (PORCINE) 5000 UNIT/ML IJ SOLN
5000.0000 [IU] | Freq: Three times a day (TID) | INTRAMUSCULAR | Status: AC
  Administered 2022-11-15 (×2): 5000 [IU] via SUBCUTANEOUS
  Filled 2022-11-15: qty 1

## 2022-11-15 MED ORDER — HEPARIN SODIUM (PORCINE) 5000 UNIT/ML IJ SOLN
INTRAMUSCULAR | Status: AC
  Administered 2022-11-15: 5000 [IU]
  Filled 2022-11-15: qty 1

## 2022-11-15 NOTE — Care Management Important Message (Signed)
Important Message  Patient Details  Name: [name] MRN: [id number] Date of Birth: 12/13/1950   Medicare Important Message Given:  Yes     Sherilyn Banker 11/15/2022, 2:58 PM

## 2022-11-15 NOTE — Progress Notes (Signed)
[facility] PROGRESS NOTE   [name] TKZ:[id number] DOB: 03-10-1951 DOA: 11/13/2022  PCP: Alysia Penna, MD  Brief History/Interval Summary: 72 y.o. male with medical history significant of chronic A-fib on Eliquis, chronic HFpEF, IIDM, CKD stage II, HTN, PPM, COPD, HLD, hypothyroidism, diabetic osteomyelitis status post right BKA with chronic poor healing right leg stump wound, presented with fall and dehiscence of the right BKA wound.  Patient was hospitalized for further management.  Orthopedics was consulted.  Consultants: Orthopedics  Procedures: None yet    Subjective/Interval History: Patient does mention that his pain in the right stump is quite significant, 7 out of 10 in intensity.  Denies any nausea vomiting.  No fever or chills.    Assessment/Plan:  Right BKA Stump with wound dehiscence Patient with history of recurrent right BKA ulcer and infection.  Most recent wound culture from June showed MRSA and MSSA.  Patient was started on vancomycin.  Also noted to be on cefazolin. Dr. Lajoyce Corners with orthopedics has been consulted who plans for revision surgery.  Eliquis on hold.  Pain control. Surgery postponed due to global network outage affecting OR services. Patient is stable.  Acute on chronic diastolic CHF Thought to be in fluid overload at admission.  Was given furosemide with good diuresis.   Seems to be stable without evidence for volume overload currently.  Chest x-ray shows only vascular congestion without any clear pulmonary edema.   Hold off on further doses of furosemide for now.  Monitor volume status closely. Last echocardiogram is from February 2023 which showed normal LVEF.  Acute kidney injury on chronic kidney disease stage II Came in with creatinine of 1.82.  Improved to 1.58. Labs pending from today.  Monitor urine output.  Avoid nephrotoxic agents. Baseline renal function seems to be around 1.5.  Paroxysmal atrial fibrillation In  sinus rhythm currently.  Holding Eliquis for upcoming surgery.  Noted to be on bisoprolol which is being continued.  Essential hypertension Noted to be on amlodipine.  Hypothyroidism Continue with levothyroxine.  Diabetes mellitus type 2 Monitor CBGs.  History of COPD on chronic steroids Stable.  Noted to be on steroids on a chronic basis which is being continued.  No evidence of exacerbation.  Continue with his inhalers.  Suspected foreign body in the right thumb This was incidentally noted on x-ray of the hand.  Nothing concerning noted on physical examination.  DVT Prophylaxis: Can be started back on his Eliquis after surgery once cleared by orthopedics. SQ heparin for now.  Code Status: DNR Family Communication: Discussed with patient Disposition Plan: To be determined  Status is: Inpatient Remains inpatient appropriate because: BKA wound dehiscence requiring surgical intervention      Medications: Scheduled:  acidophilus  2 capsule Oral TID   amLODipine  5 mg Oral q AM   bisoprolol  5 mg Oral Daily   dextromethorphan-guaiFENesin  2 tablet Oral Daily   diphenhydrAMINE  25 mg Oral Q24H   fluticasone furoate-vilanterol  1 puff Inhalation Daily   And   umeclidinium bromide  1 puff Inhalation Daily   gabapentin  600 mg Oral TID   insulin aspart  0-9 Units Subcutaneous TID WC   levothyroxine  175 mcg Oral QAC breakfast   pantoprazole  40 mg Oral QAC breakfast   potassium chloride SA  20 mEq Oral 2 times per day every other day   predniSONE  2.5 mg Oral Q breakfast   rOPINIRole  1 mg Oral TID   rosuvastatin  10 mg Oral QHS   senna-docusate  1 tablet Oral BID   tranexamic acid (CYKLOKAPRON) 2,000 mg in sodium chloride 0.9 % 50 mL Topical Application  2,000 mg Topical To OR   traZODone  50 mg Oral QHS   Continuous:   ceFAZolin (ANCEF) IV 2 g (11/15/22 1610)   vancomycin 1,000 mg (11/14/22 1452)   RUE:[id number], HYDROmorphone (DILAUDID) injection, hydrOXYzine, Muscle  Rub, ondansetron, oxyCODONE, polyethylene glycol powder, polyvinyl alcohol, sucralfate  Antibiotics: Anti-infectives (From admission, onward)    Start     Dose/Rate Route Frequency Ordered Stop   11/14/22 1500  vancomycin (VANCOCIN) IVPB 1000 mg/200 mL premix       Placed in "And" Linked Group   1,000 mg 100 mL/hr over 120 Minutes Intravenous Every 24 hours 11/13/22 1606     11/13/22 2200  ceFAZolin (ANCEF) IVPB 2g/100 mL premix        2 g 200 mL/hr over 30 Minutes Intravenous Every 8 hours 11/13/22 1606     11/13/22 1345  ceFEPIme (MAXIPIME) 2 g in sodium chloride 0.9 % 100 mL IVPB        2 g 200 mL/hr over 30 Minutes Intravenous  Once 11/13/22 1336 11/13/22 1554   11/13/22 1345  metroNIDAZOLE (FLAGYL) IVPB 500 mg        500 mg 100 mL/hr over 60 Minutes Intravenous  Once 11/13/22 1336 11/13/22 2004   11/13/22 1330  vancomycin (VANCOREADY) IVPB 1750 mg/350 mL        1,750 mg 87.5 mL/hr over 240 Minutes Intravenous  Once 11/13/22 1328 11/13/22 2144       Objective:  Vital Signs  Vitals:   11/14/22 2000 11/15/22 0546 11/15/22 0700 11/15/22 0822  BP: 119/83 116/85 125/78   Pulse: 64 63 61   Resp: 18 17 17    Temp: 97.7 F (36.5 C)  (!) 97.5 F (36.4 C)   TempSrc: Oral  Oral   SpO2: 97% 98% 98% 96%  Weight:      Height:        Intake/Output Summary (Last 24 hours) at 11/15/2022 1111 Last data filed at 11/15/2022 0900 Gross per 24 hour  Intake 120 ml  Output 550 ml  Net -430 ml   Filed Weights   11/13/22 1113 11/13/22 1118  Weight: 74.8 kg 74.8 kg   Awake alert. In no distress Lungs are CTA S1s2 normal reg Abdomen is soft, non tender Extremities: Right lower extremity stump is covered in dressing   Lab Results:  Data Reviewed: I have personally reviewed following labs and reports of the imaging studies  CBC: Recent Labs  Lab 11/13/22 1102 11/14/22 0135  WBC 7.3 10.5  HGB 11.8* 13.3  HCT 38.1* 42.4  MCV 92.0 91.8  PLT 156 185    Basic Metabolic  Panel: Recent Labs  Lab 11/13/22 1102 11/14/22 0135  NA 133* 132*  K 4.4 4.6  CL 93* 93*  CO2 26 25  GLUCOSE 88 102*  BUN 21 18  CREATININE 1.82* 1.58*  CALCIUM 9.1 9.2    GFR: Estimated Creatinine Clearance: 44.1 mL/min (A) (by C-G formula based on SCr of 1.58 mg/dL (H)).  Liver Function Tests: Recent Labs  Lab 11/13/22 1102  AST 34  ALT 25  ALKPHOS 52  BILITOT 0.2*  PROT 7.1  ALBUMIN 3.9    HbA1C: Recent Labs    11/13/22 1805  HGBA1C 6.3*    CBG: Recent Labs  Lab 11/14/22 0733 11/14/22 1118 11/14/22 1556 11/14/22 2056 11/15/22  0756  GLUCAP 95 101* 117* 139* 85    Radiology Studies: DG Chest 1 View  Result Date: 11/14/2022 CLINICAL DATA:  CHF. EXAM: CHEST  1 VIEW COMPARISON:  11/13/2022 FINDINGS: The cardiopericardial silhouette is within normal limits for size. Mild vascular congestion without overt airspace pulmonary edema. No pleural effusion or focal airspace consolidation. Left-sided permanent pacemaker again noted. No acute bony abnormality. IMPRESSION: Mild vascular congestion without overt airspace pulmonary edema. Electronically Signed   By: Kennith Center M.D.   On: 11/14/2022 06:07   CT Cervical Spine Wo Contrast  Result Date: 11/13/2022 CLINICAL DATA:  Trauma, pain EXAM: CT CERVICAL SPINE WITHOUT CONTRAST TECHNIQUE: Multidetector CT imaging of the cervical spine was performed without intravenous contrast. Multiplanar CT image reconstructions were also generated. RADIATION DOSE REDUCTION: This exam was performed according to the departmental dose-optimization program which includes automated exposure control, adjustment of the mA and/or kV according to patient size and/or use of iterative reconstruction technique. COMPARISON:  05/13/2022 FINDINGS: Alignment: Alignment of posterior margins of vertebral bodies is within normal limits. There is minimal dextroscoliosis. Skull base and vertebrae: No recent fracture is seen. Degenerative changes are noted,  more so at C4-C5 and C5-C6 levels. Soft tissues and spinal canal: There is no central spinal stenosis. Disc levels: There is moderate encroachment of left neural foramen at C2-C3 level. There is mild to moderate encroachment of left neural foramen at C3-C4 level. There is moderate to marked encroachment of neural foramina at C4-C5 and C5-C6 levels. Upper chest: Unremarkable. Other: Thyroid is difficult to visualize. IMPRESSION: No recent fracture is seen in cervical spine. Cervical spondylosis with encroachment of neural foramina at multiple levels as described in the body of the report. Electronically Signed   By: Ernie Avena M.D.   On: 11/13/2022 12:36   CT Head Wo Contrast  Result Date: 11/13/2022 CLINICAL DATA:  Trauma, fall EXAM: CT HEAD WITHOUT CONTRAST TECHNIQUE: Contiguous axial images were obtained from the base of the skull through the vertex without intravenous contrast. RADIATION DOSE REDUCTION: This exam was performed according to the departmental dose-optimization program which includes automated exposure control, adjustment of the mA and/or kV according to patient size and/or use of iterative reconstruction technique. COMPARISON:  10/17/2022 FINDINGS: Brain: No acute intracranial findings are seen. There are no signs of bleeding within the cranium. Cortical sulci are prominent. Vascular: Unremarkable. Skull: No fracture is seen in calvarium. Sinuses/Orbits: There is a mucous retention cyst in right maxillary sinus. There is mild medial bulging of inferior aspect of medial wall of left orbit, possibly residual change from remote injury. Other: None. IMPRESSION: No acute intracranial findings are seen in noncontrast CT brain. Atrophy. Electronically Signed   By: Ernie Avena M.D.   On: 11/13/2022 12:30   DG Hand Complete Right  Result Date: 11/13/2022 CLINICAL DATA:  Trauma, fall EXAM: RIGHT HAND - COMPLETE 3+ VIEW COMPARISON:  None Available. FINDINGS: Monitoring devices is  partly obscuring the middle and distal phalanges of index finger. No recent fracture or dislocation is seen. Degenerative changes are noted with bony spurs and multiple interphalangeal joints and metacarpophalangeal joints. Bony spurs are seen in first carpometacarpal joint. Deformity in the distal ulna may be residual from previous injury. Degenerative changes are noted in the inferior radioulnar joint. There is a thin linear radiopacity in the soft tissues in the tip of right thumb. IMPRESSION: No fracture or dislocation is seen. Degenerative changes are noted in multiple joints. There is small thin linear radiopacity in the  soft tissues along the palmar aspect of tip of distal phalanx right thumb suggesting foreign body in the soft tissues. Electronically Signed   By: Ernie Avena M.D.   On: 11/13/2022 12:25   DG Pelvis Portable  Result Date: 11/13/2022 CLINICAL DATA:  Trauma, fall EXAM: PORTABLE PELVIS 1-2 VIEWS COMPARISON:  None Available. FINDINGS: There is no evidence of displaced pelvic fracture or diastasis. No pelvic bone lesions are seen. Nonobstructive pattern of overlying bowel gas. Large burden of stool. IMPRESSION: No displaced fracture of the pelvis or bilateral proximal femurs in single frontal view. Electronically Signed   By: Jearld Lesch M.D.   On: 11/13/2022 12:15   DG Chest Port 1 View  Result Date: 11/13/2022 CLINICAL DATA:  trauma EXAM: PORTABLE CHEST 1 VIEW COMPARISON:  CXR 10/17/22 FINDINGS: Left-sided dual lead cardiac device in place with unchanged lead positioning. Status post median sternotomy and aortic valve repair. Unchanged cardiac and mediastinal contours. No pleural effusion. No pneumothorax. No radiographically apparent displaced rib fractures. Visualized upper abdomen is unremarkable. Prominent bilateral interstitial opacities that could represent pulmonary venous congestion. IMPRESSION: Prominent bilateral interstitial opacities that could represent pulmonary  venous congestion. Electronically Signed   By: Lorenza Cambridge M.D.   On: 11/13/2022 12:08       LOS: 2 days   Dajane Valli Rito Ehrlich  [facility] Pager on [email or web address]  11/15/2022, 11:11 AM

## 2022-11-15 NOTE — H&P (View-Only) (Signed)
Patient ID: [name], male   DOB: 19-Mar-1951, 72 y.o.   MRN: [id number] Plan for revision BKA saturday

## 2022-11-15 NOTE — Progress Notes (Signed)
Patient ID: [name], male   DOB: 19-Mar-1951, 72 y.o.   MRN: [id number] Plan for revision BKA saturday

## 2022-11-15 NOTE — Plan of Care (Signed)
progressing 

## 2022-11-16 ENCOUNTER — Inpatient Hospital Stay (HOSPITAL_COMMUNITY): Payer: Medicare Other | Admitting: Certified Registered Nurse Anesthetist

## 2022-11-16 ENCOUNTER — Encounter (HOSPITAL_COMMUNITY): Payer: Self-pay | Admitting: Internal Medicine

## 2022-11-16 ENCOUNTER — Encounter (HOSPITAL_COMMUNITY)
Admission: EM | Disposition: A | Discharge status: Home / Self Care | Payer: Self-pay | Source: Home / Self Care | Attending: Internal Medicine

## 2022-11-16 ENCOUNTER — Other Ambulatory Visit: Payer: Self-pay

## 2022-11-16 DIAGNOSIS — T8133XA Disruption of traumatic injury wound repair, initial encounter: Secondary | ICD-10-CM

## 2022-11-16 DIAGNOSIS — I110 Hypertensive heart disease with heart failure: Secondary | ICD-10-CM

## 2022-11-16 DIAGNOSIS — I5033 Acute on chronic diastolic (congestive) heart failure: Secondary | ICD-10-CM | POA: Diagnosis not present

## 2022-11-16 DIAGNOSIS — Z87891 Personal history of nicotine dependence: Secondary | ICD-10-CM

## 2022-11-16 DIAGNOSIS — T8781 Dehiscence of amputation stump: Secondary | ICD-10-CM | POA: Diagnosis not present

## 2022-11-16 DIAGNOSIS — S8001XA Contusion of right knee, initial encounter: Secondary | ICD-10-CM

## 2022-11-16 DIAGNOSIS — I480 Paroxysmal atrial fibrillation: Secondary | ICD-10-CM | POA: Diagnosis not present

## 2022-11-16 HISTORY — PX: STUMP REVISION: SHX6102

## 2022-11-16 LAB — CBC WITH DIFFERENTIAL/PLATELET
Abs Immature Granulocytes: 0.04 10*3/uL (ref 0.00–0.07)
Basophils Absolute: 0.1 10*3/uL (ref 0.0–0.1)
Basophils Relative: 1 %
Eosinophils Absolute: 0.2 10*3/uL (ref 0.0–0.5)
Eosinophils Relative: 3 %
HCT: 38.3 % — ABNORMAL LOW (ref 39.0–52.0)
Hemoglobin: 11.9 g/dL — ABNORMAL LOW (ref 13.0–17.0)
Immature Granulocytes: 1 %
Lymphocytes Relative: 16 %
Lymphs Abs: 1.3 10*3/uL (ref 0.7–4.0)
MCH: 27.7 pg (ref 26.0–34.0)
MCHC: 31.1 g/dL (ref 30.0–36.0)
MCV: 89.3 fL (ref 80.0–100.0)
Monocytes Absolute: 0.6 10*3/uL (ref 0.1–1.0)
Monocytes Relative: 8 %
Neutro Abs: 6.1 10*3/uL (ref 1.7–7.7)
Neutrophils Relative %: 71 %
Platelets: 160 10*3/uL (ref 150–400)
RBC: 4.29 MIL/uL (ref 4.22–5.81)
RDW: 14.8 % (ref 11.5–15.5)
WBC: 8.4 10*3/uL (ref 4.0–10.5)
nRBC: 0 % (ref 0.0–0.2)

## 2022-11-16 LAB — GLUCOSE, CAPILLARY
Glucose-Capillary: 92 mg/dL (ref 70–99)
Glucose-Capillary: 78 mg/dL (ref 70–99)
Glucose-Capillary: 123 mg/dL — ABNORMAL HIGH (ref 70–99)
Glucose-Capillary: 149 mg/dL — ABNORMAL HIGH (ref 70–99)
Glucose-Capillary: 186 mg/dL — ABNORMAL HIGH (ref 70–99)
Glucose-Capillary: 113 mg/dL — ABNORMAL HIGH (ref 70–99)

## 2022-11-16 SURGERY — REVISION, AMPUTATION SITE
Anesthesia: Choice | Laterality: Right

## 2022-11-16 MED ORDER — TRANEXAMIC ACID-NACL 1000-0.7 MG/100ML-% IV SOLN
1000.0000 mg | INTRAVENOUS | Status: AC
  Administered 2022-11-16: 1000 mg via INTRAVENOUS

## 2022-11-16 MED ORDER — CHLORHEXIDINE GLUCONATE 4 % EX SOLN: 60.0000 mL | Freq: Once | CUTANEOUS | Status: DC

## 2022-11-16 MED ORDER — ROCURONIUM BROMIDE 10 MG/ML (PF) SYRINGE
PREFILLED_SYRINGE | INTRAVENOUS | Status: AC
  Filled 2022-11-16: qty 10

## 2022-11-16 MED ORDER — SUCCINYLCHOLINE CHLORIDE 200 MG/10ML IV SOSY
PREFILLED_SYRINGE | INTRAVENOUS | Status: DC | PRN
  Administered 2022-11-16: 140 mg via INTRAVENOUS

## 2022-11-16 MED ORDER — OXYCODONE HCL 5 MG PO TABS
ORAL_TABLET | ORAL | Status: AC
  Administered 2022-11-16: 10 mg
  Filled 2022-11-16: qty 2

## 2022-11-16 MED ORDER — HYDROMORPHONE HCL 1 MG/ML IJ SOLN
0.5000 mg | INTRAMUSCULAR | Status: DC | PRN
  Administered 2022-11-16 (×2): 0.5 mg via INTRAVENOUS

## 2022-11-16 MED ORDER — HYDROMORPHONE HCL 1 MG/ML IJ SOLN
0.5000 mg | INTRAMUSCULAR | Status: AC | PRN
  Administered 2022-11-16 (×4): 0.5 mg via INTRAVENOUS

## 2022-11-16 MED ORDER — FENTANYL CITRATE (PF) 250 MCG/5ML IJ SOLN
INTRAMUSCULAR | Status: AC
  Filled 2022-11-16: qty 5

## 2022-11-16 MED ORDER — ONDANSETRON HCL 4 MG/2ML IJ SOLN
INTRAMUSCULAR | Status: AC
  Filled 2022-11-16: qty 4

## 2022-11-16 MED ORDER — HYDROMORPHONE HCL 1 MG/ML IJ SOLN
INTRAMUSCULAR | Status: AC
  Filled 2022-11-16: qty 1

## 2022-11-16 MED ORDER — PHENYLEPHRINE HCL-NACL 20-0.9 MG/250ML-% IV SOLN
INTRAVENOUS | Status: DC | PRN
  Administered 2022-11-16: 50 ug/min via INTRAVENOUS

## 2022-11-16 MED ORDER — JUVEN PO PACK
1.0000 | PACK | Freq: Two times a day (BID) | ORAL | Status: DC
  Administered 2022-11-16 – 2022-11-18 (×4): 1 via ORAL
  Filled 2022-11-16 (×4): qty 1

## 2022-11-16 MED ORDER — POVIDONE-IODINE 10 % EX SWAB: 2.0000 | Freq: Once | CUTANEOUS | Status: DC

## 2022-11-16 MED ORDER — EPHEDRINE SULFATE-NACL 50-0.9 MG/10ML-% IV SOSY
PREFILLED_SYRINGE | INTRAVENOUS | Status: DC | PRN
  Administered 2022-11-16: 5 mg via INTRAVENOUS

## 2022-11-16 MED ORDER — VITAMIN C 500 MG PO TABS
1000.0000 mg | ORAL_TABLET | Freq: Every day | ORAL | Status: DC
  Administered 2022-11-16 – 2022-11-18 (×3): 1000 mg via ORAL
  Filled 2022-11-16 (×3): qty 2

## 2022-11-16 MED ORDER — 0.9 % SODIUM CHLORIDE (POUR BTL) OPTIME
TOPICAL | Status: DC | PRN
  Administered 2022-11-16: 1000 mL

## 2022-11-16 MED ORDER — CEFAZOLIN SODIUM-DEXTROSE 2-4 GM/100ML-% IV SOLN: 2.0000 g | INTRAVENOUS | Status: DC

## 2022-11-16 MED ORDER — LACTATED RINGERS IV SOLN: INTRAVENOUS | Status: DC

## 2022-11-16 MED ORDER — FENTANYL CITRATE (PF) 250 MCG/5ML IJ SOLN
INTRAMUSCULAR | Status: DC | PRN
  Administered 2022-11-16 (×4): 25 ug via INTRAVENOUS
  Administered 2022-11-16: 75 ug via INTRAVENOUS
  Administered 2022-11-16: 50 ug via INTRAVENOUS
  Administered 2022-11-16: 25 ug via INTRAVENOUS

## 2022-11-16 MED ORDER — CEFAZOLIN SODIUM-DEXTROSE 2-4 GM/100ML-% IV SOLN
2.0000 g | INTRAVENOUS | Status: AC
  Administered 2022-11-16: 2 g via INTRAVENOUS

## 2022-11-16 MED ORDER — TRANEXAMIC ACID-NACL 1000-0.7 MG/100ML-% IV SOLN: 1000.0000 mg | INTRAVENOUS | Status: DC

## 2022-11-16 MED ORDER — DEXAMETHASONE SODIUM PHOSPHATE 10 MG/ML IJ SOLN
INTRAMUSCULAR | Status: AC
  Filled 2022-11-16: qty 2

## 2022-11-16 MED ORDER — METOPROLOL TARTRATE 5 MG/5ML IV SOLN: 2.0000 mg | INTRAVENOUS | Status: DC | PRN

## 2022-11-16 MED ORDER — GUAIFENESIN-DM 100-10 MG/5ML PO SYRP: 15.0000 mL | ORAL_SOLUTION | ORAL | Status: DC | PRN

## 2022-11-16 MED ORDER — SODIUM CHLORIDE 0.9 % IV SOLN: INTRAVENOUS | Status: DC

## 2022-11-16 MED ORDER — POTASSIUM CHLORIDE CRYS ER 20 MEQ PO TBCR: 20.0000 meq | EXTENDED_RELEASE_TABLET | Freq: Every day | ORAL | Status: DC | PRN

## 2022-11-16 MED ORDER — DEXAMETHASONE SODIUM PHOSPHATE 10 MG/ML IJ SOLN
INTRAMUSCULAR | Status: DC | PRN
  Administered 2022-11-16: 5 mg via INTRAVENOUS

## 2022-11-16 MED ORDER — BISACODYL 5 MG PO TBEC: 5.0000 mg | DELAYED_RELEASE_TABLET | Freq: Every day | ORAL | Status: DC | PRN

## 2022-11-16 MED ORDER — ALBUMIN HUMAN 5 % IV SOLN: INTRAVENOUS | Status: DC | PRN

## 2022-11-16 MED ORDER — ALUM & MAG HYDROXIDE-SIMETH 200-200-20 MG/5ML PO SUSP: 15.0000 mL | ORAL | Status: DC | PRN

## 2022-11-16 MED ORDER — EPHEDRINE 5 MG/ML INJ
INTRAVENOUS | Status: AC
  Filled 2022-11-16: qty 5

## 2022-11-16 MED ORDER — CEFAZOLIN SODIUM-DEXTROSE 2-4 GM/100ML-% IV SOLN
INTRAVENOUS | Status: AC
  Filled 2022-11-16: qty 100

## 2022-11-16 MED ORDER — CEFAZOLIN SODIUM-DEXTROSE 2-4 GM/100ML-% IV SOLN
2.0000 g | Freq: Three times a day (TID) | INTRAVENOUS | Status: AC
  Administered 2022-11-16 – 2022-11-17 (×2): 2 g via INTRAVENOUS
  Filled 2022-11-16 (×2): qty 100

## 2022-11-16 MED ORDER — HYDRALAZINE HCL 20 MG/ML IJ SOLN: 5.0000 mg | INTRAMUSCULAR | Status: DC | PRN

## 2022-11-16 MED ORDER — HYDROMORPHONE HCL 1 MG/ML IJ SOLN
0.5000 mg | INTRAMUSCULAR | Status: DC | PRN
  Administered 2022-11-16: 1 mg via INTRAVENOUS
  Administered 2022-11-17 – 2022-11-18 (×2): 0.5 mg via INTRAVENOUS
  Filled 2022-11-16 (×3): qty 1

## 2022-11-16 MED ORDER — PROPOFOL 10 MG/ML IV BOLUS
INTRAVENOUS | Status: AC
  Filled 2022-11-16: qty 20

## 2022-11-16 MED ORDER — PHENOL 1.4 % MT LIQD: 1.0000 | OROMUCOSAL | Status: DC | PRN

## 2022-11-16 MED ORDER — CHLORHEXIDINE GLUCONATE 0.12 % MT SOLN: 15.0000 mL | Freq: Once | OROMUCOSAL | Status: AC

## 2022-11-16 MED ORDER — ORAL CARE MOUTH RINSE: 15.0000 mL | Freq: Once | OROMUCOSAL | Status: AC

## 2022-11-16 MED ORDER — LIDOCAINE 2% (20 MG/ML) 5 ML SYRINGE
INTRAMUSCULAR | Status: AC
  Filled 2022-11-16: qty 5

## 2022-11-16 MED ORDER — MAGNESIUM SULFATE 2 GM/50ML IV SOLN: 2.0000 g | Freq: Every day | INTRAVENOUS | Status: DC | PRN

## 2022-11-16 MED ORDER — OXYCODONE HCL 5 MG PO TABS
5.0000 mg | ORAL_TABLET | ORAL | Status: DC | PRN
  Administered 2022-11-16 – 2022-11-18 (×2): 10 mg via ORAL
  Filled 2022-11-16 (×3): qty 2

## 2022-11-16 MED ORDER — ONDANSETRON HCL 4 MG/2ML IJ SOLN
INTRAMUSCULAR | Status: DC | PRN
  Administered 2022-11-16: 4 mg via INTRAVENOUS

## 2022-11-16 MED ORDER — CHLORHEXIDINE GLUCONATE 0.12 % MT SOLN
OROMUCOSAL | Status: AC
  Administered 2022-11-16: 15 mL via OROMUCOSAL
  Filled 2022-11-16: qty 15

## 2022-11-16 MED ORDER — ZINC SULFATE 220 (50 ZN) MG PO CAPS
220.0000 mg | ORAL_CAPSULE | Freq: Every day | ORAL | Status: DC
  Administered 2022-11-16 – 2022-11-18 (×3): 220 mg via ORAL
  Filled 2022-11-16 (×3): qty 1

## 2022-11-16 MED ORDER — MAGNESIUM CITRATE PO SOLN: 1.0000 | Freq: Once | ORAL | Status: DC | PRN

## 2022-11-16 MED ORDER — LABETALOL HCL 5 MG/ML IV SOLN: 10.0000 mg | INTRAVENOUS | Status: DC | PRN

## 2022-11-16 MED ORDER — PROPOFOL 10 MG/ML IV BOLUS
INTRAVENOUS | Status: DC | PRN
Start: 2022-11-16 — End: 2022-11-16
  Administered 2022-11-16: 110 mg via INTRAVENOUS

## 2022-11-16 MED ORDER — SUCCINYLCHOLINE CHLORIDE 200 MG/10ML IV SOSY
PREFILLED_SYRINGE | INTRAVENOUS | Status: AC
  Filled 2022-11-16: qty 10

## 2022-11-16 MED ORDER — TRANEXAMIC ACID-NACL 1000-0.7 MG/100ML-% IV SOLN
INTRAVENOUS | Status: AC
  Filled 2022-11-16: qty 100

## 2022-11-16 MED ORDER — ONDANSETRON HCL 4 MG/2ML IJ SOLN: 4.0000 mg | Freq: Four times a day (QID) | INTRAMUSCULAR | Status: DC | PRN

## 2022-11-16 MED ORDER — ACETAMINOPHEN 325 MG PO TABS
325.0000 mg | ORAL_TABLET | Freq: Four times a day (QID) | ORAL | Status: DC | PRN
  Administered 2022-11-17: 650 mg via ORAL
  Filled 2022-11-16: qty 2

## 2022-11-16 MED ORDER — DOCUSATE SODIUM 100 MG PO CAPS
100.0000 mg | ORAL_CAPSULE | Freq: Every day | ORAL | Status: DC
  Administered 2022-11-17 – 2022-11-18 (×2): 100 mg via ORAL
  Filled 2022-11-16 (×2): qty 1

## 2022-11-16 MED ORDER — OXYCODONE HCL 5 MG PO TABS
10.0000 mg | ORAL_TABLET | ORAL | Status: DC | PRN
  Administered 2022-11-17 – 2022-11-18 (×4): 10 mg via ORAL
  Filled 2022-11-16 (×3): qty 2

## 2022-11-16 MED ORDER — PANTOPRAZOLE SODIUM 40 MG PO TBEC
40.0000 mg | DELAYED_RELEASE_TABLET | Freq: Every day | ORAL | Status: DC
  Administered 2022-11-17 – 2022-11-18 (×2): 40 mg via ORAL
  Filled 2022-11-16 (×2): qty 1

## 2022-11-16 MED ORDER — POLYETHYLENE GLYCOL 3350 17 G PO PACK: 17.0000 g | PACK | Freq: Every day | ORAL | Status: DC | PRN

## 2022-11-16 MED ORDER — LIDOCAINE 2% (20 MG/ML) 5 ML SYRINGE
INTRAMUSCULAR | Status: DC | PRN
  Administered 2022-11-16: 60 mg via INTRAVENOUS

## 2022-11-16 MED ORDER — OXYCODONE HCL 5 MG/5ML PO SOLN: 10.0000 mg | Freq: Once | ORAL | Status: DC | PRN

## 2022-11-16 MED ORDER — POVIDONE-IODINE 10 % EX SWAB
2.0000 | Freq: Once | CUTANEOUS | Status: AC
  Administered 2022-11-16: 2 via TOPICAL

## 2022-11-16 SURGICAL SUPPLY — 34 items
BAG COUNTER SPONGE SURGICOUNT (BAG) ×1 IMPLANT
BAG SPNG CNTER NS LX DISP (BAG) ×1
BLADE SAW RECIP 87.9 MT (BLADE) IMPLANT
BLADE SURG 21 STRL SS (BLADE) ×1 IMPLANT
CANISTER WOUND CARE 500ML ATS (WOUND CARE) ×1 IMPLANT
COVER SURGICAL LIGHT HANDLE (MISCELLANEOUS) ×1 IMPLANT
DRAPE DERMATAC (DRAPES) IMPLANT
DRAPE EXTREMITY T 121X128X90 (DISPOSABLE) ×1 IMPLANT
DRAPE HALF SHEET 40X57 (DRAPES) ×1 IMPLANT
DRAPE INCISE IOBAN 66X45 STRL (DRAPES) ×1 IMPLANT
DRAPE U-SHAPE 47X51 STRL (DRAPES) ×2 IMPLANT
DRESSING PREVENA PLUS CUSTOM (GAUZE/BANDAGES/DRESSINGS) ×1 IMPLANT
DRSG PREVENA PLUS CUSTOM (GAUZE/BANDAGES/DRESSINGS) ×1
DURAPREP 26ML APPLICATOR (WOUND CARE) ×1 IMPLANT
ELECT REM PT RETURN 9FT ADLT (ELECTROSURGICAL) ×1
ELECTRODE REM PT RTRN 9FT ADLT (ELECTROSURGICAL) ×1 IMPLANT
GLOVE BIOGEL PI IND STRL 9 (GLOVE) ×1 IMPLANT
GLOVE SURG ORTHO 9.0 STRL STRW (GLOVE) ×1 IMPLANT
GOWN STRL REUS W/ TWL XL LVL3 (GOWN DISPOSABLE) ×2 IMPLANT
GOWN STRL REUS W/TWL XL LVL3 (GOWN DISPOSABLE) ×2
GRAFT SKIN WND MICRO 38 (Tissue) IMPLANT
KIT BASIN OR (CUSTOM PROCEDURE TRAY) ×1 IMPLANT
KIT TURNOVER KIT B (KITS) ×1 IMPLANT
MANIFOLD NEPTUNE II (INSTRUMENTS) ×1 IMPLANT
NS IRRIG 1000ML POUR BTL (IV SOLUTION) ×1 IMPLANT
PACK GENERAL/GYN (CUSTOM PROCEDURE TRAY) ×1 IMPLANT
PAD ARMBOARD 7.5X6 YLW CONV (MISCELLANEOUS) ×1 IMPLANT
PREVENA RESTOR ARTHOFORM 46X30 (CANNISTER) ×1 IMPLANT
STAPLER VISISTAT (STAPLE) IMPLANT
STAPLER VISISTAT 35W (STAPLE) IMPLANT
SUT ETHILON 2 0 PSLX (SUTURE) ×2 IMPLANT
SUT SILK 2 0 (SUTURE)
SUT SILK 2-0 18XBRD TIE 12 (SUTURE) IMPLANT
TOWEL GREEN STERILE (TOWEL DISPOSABLE) ×1 IMPLANT

## 2022-11-16 NOTE — Plan of Care (Signed)
  Problem: Coping: Goal: Ability to adjust to condition or change in health will improve Outcome: Progressing   

## 2022-11-16 NOTE — Progress Notes (Signed)
Pharmacy Antibiotic Note  [name] is a 72 y.o. male admitted on 11/13/2022 with  R BKA stump infection .  Pharmacy has been consulted for Cefazolin and Vancomycin dosing.  Patient received one-time doses of cefepime 2g IV, metronidazole 500mg  IV, and vancomycin 1750mg  IV in the ED.  WBC 8.4, remains afebrile SCr 1.67 (baseline SCr ~1.4-1.5)  Plan: Continue Cefazolin 2g IV q8h Continue Vancomycin 1000mg  IV q24h (initial eAUC ~514)    > Goal AUC 400-550    > Will plan to obtain levels tomorrow at steady state.  Monitor daily CBC, temp, SCr, and for clinical signs of improvement  Will f/u cultures and de-escalate antibiotics as able postoperatively.  Height: 5\' 11"  (180.3 cm) Weight: 74.8 kg (164 lb 14.5 oz) IBW/kg (Calculated) : 75.3  Temp (24hrs), Avg:97.9 F (36.6 C), Min:97.5 F (36.4 C), Max:98.2 F (36.8 C)  Recent Labs  Lab 11/13/22 1102 11/14/22 0135 11/15/22 0450 11/16/22 0239  WBC 7.3 10.5  --  8.4  CREATININE 1.82* 1.58* 1.67*  --     Estimated Creatinine Clearance: 41.7 mL/min (A) (by C-G formula based on SCr of 1.67 mg/dL (H)).    Allergies  Allergen Reactions   Meloxicam Rash and Other (See Comments)    Red Man Syndrome, also   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 syndrome 09/02/15, resolved with diphenhydramine and slowing of rate    Antimicrobials this admission: Cefepime 7/17 x1 Metronidazole 7/17 x1 Vancomycin 7/17 >>  Cefazolin 7/17 >>   Dose adjustments this admission: N/A  Microbiology results: (Prior to admission Wound culture 10/09/22: MSSA, MRSE) 7/17 MRSA PCR: Negative  Thank you for allowing pharmacy to be a part of this patient's care.  Lora Paula, PharmD Clinical Pharmacist 11/16/2022 10:24 AM   Please refer to AMION for pharmacy phone number

## 2022-11-16 NOTE — Anesthesia Preprocedure Evaluation (Signed)
Anesthesia Evaluation  Patient identified by MRN, date of birth, ID band Patient awake    Reviewed: Allergy & Precautions, NPO status , Patient's Chart, lab work & pertinent test results, reviewed documented beta blocker date and time   History of Anesthesia Complications Negative for: history of anesthetic complications  Airway Mallampati: I  TM Distance: >3 FB Neck ROM: Full    Dental  (+) Edentulous Upper, Dental Advisory Given   Pulmonary shortness of breath, asthma , sleep apnea (does not use CPAP) and Oxygen sleep apnea , COPD (2L O2 at night),  COPD inhaler and oxygen dependent, former smoker    + decreased breath sounds      Cardiovascular hypertension, Pt. on home beta blockers and Pt. on medications pulmonary hypertension+ CAD and +CHF  + dysrhythmias Atrial Fibrillation + pacemaker + Valvular Problems/Murmurs (s/p AVR)  Rhythm:Regular  TTE 2023  1. Left ventricular ejection fraction, by estimation, is 55 to 60%. The  left ventricle has normal function. The left ventricle has no regional  wall motion abnormalities. Left ventricular diastolic parameters are  indeterminate.   2. Right ventricular systolic function is normal. The right ventricular  size is normal. The estimated right ventricular systolic pressure is 32.4  mmHg.   3. The mitral valve is normal in structure. Trivial mitral valve  regurgitation. No evidence of mitral stenosis.   4. The aortic valve has been repaired/replaced. Aortic valve  regurgitation is not visualized. No aortic stenosis is present. There is a  bioprosthetic valve present in the aortic position. Aortic valve area, by  VTI measures 1.63 cm. Aortic valve Vmax  measures 2.28 m/s. Aortic valve mean gradient measures 11.0 mmHg. Aortic  valve peak gradient measures 20.8 mmHg.   5. There is mild dilatation of the ascending aorta, measuring 38 mm.   6. The inferior vena cava is normal in size  with greater than 50%  respiratory variability, suggesting right atrial pressure of 3 mmHg.   7. Compared to echo 12/13/2019, the mean AVG has increased from 8 to  . DVi has dcreased from 0.65 to 0.57 and AVA has decreased from  2.04cm2 to 1.63cm2. The AVA calculation is likely underestimated due to  small LVOT measurement.   8. Left atrial size was mildly dilated.     Neuro/Psych  Neuromuscular disease  negative psych ROS   GI/Hepatic Neg liver ROS,GERD  ,,  Endo/Other  diabetes, Type 2, Oral Hypoglycemic AgentsHypothyroidism    Renal/GU Renal InsufficiencyRenal diseaseLab Results      Component                Value               Date                      CREATININE               1.67 (H)            11/15/2022                Musculoskeletal  (+) Arthritis ,  Fibromyalgia -  Abdominal   Peds  Hematology  (+) Blood dyscrasia (eliquis), anemia Lab Results      Component                Value               Date  WBC                      8.4                 11/16/2022                HGB                      11.9 (L)            11/16/2022                HCT                      38.3 (L)            11/16/2022                MCV                      89.3                11/16/2022                PLT                      160                 11/16/2022              Anesthesia Other Findings   Reproductive/Obstetrics                             Anesthesia Physical Anesthesia Plan  ASA: 3  Anesthesia Plan: General   Post-op Pain Management: Tylenol PO (pre-op)*   Induction: Intravenous  PONV Risk Score and Plan: 2 and Ondansetron, Dexamethasone and Treatment may vary due to age or medical condition  Airway Management Planned: LMA  Additional Equipment: None  Intra-op Plan:   Post-operative Plan: Extubation in OR  Informed Consent: I have reviewed the patients History and Physical, chart, labs and discussed the  procedure including the risks, benefits and alternatives for the proposed anesthesia with the patient or authorized representative who has indicated his/her understanding and acceptance.     Dental advisory given  Plan Discussed with: CRNA  Anesthesia Plan Comments:        Anesthesia Quick Evaluation

## 2022-11-16 NOTE — Anesthesia Postprocedure Evaluation (Signed)
Anesthesia Post Note  Patient: [name]  Procedure(s) Performed: REVISION RIGHT BELOW KNEE AMPUTATION (Right)     Patient location during evaluation: PACU Anesthesia Type: General Level of consciousness: awake and alert Pain management: satisfactory to patient Vital Signs Assessment: post-procedure vital signs reviewed and stable Respiratory status: spontaneous breathing, nonlabored ventilation, respiratory function stable and patient connected to nasal cannula oxygen Cardiovascular status: blood pressure returned to baseline and stable Postop Assessment: no apparent nausea or vomiting Anesthetic complications: no   No notable events documented.  Last Vitals:  Vitals:   11/16/22 1401 11/16/22 1405  BP: 132/78 135/81  Pulse: 62 63  Resp: 16 16  Temp: 36.5 C   SpO2: 95% 95%    Last Pain:  Vitals:   11/16/22 1345  TempSrc:   PainSc: 9                  Darral Rishel

## 2022-11-16 NOTE — Op Note (Signed)
11/16/2022  1:52 PM  PATIENT:  [name]    PRE-OPERATIVE DIAGNOSIS:  Traumatic Dehiscence Right Below Knee Amputation secondary to fall.  POST-OPERATIVE DIAGNOSIS:  Same  PROCEDURE:  REVISION RIGHT BELOW KNEE AMPUTATION. Application of Kerecis micro graft 38 cm. Application Prevena wound VAC sponges.  SURGEON:  [name], MD  PHYSICIAN ASSISTANT: Hart Carwin ANESTHESIA:   General  PREOPERATIVE INDICATIONS:  [name] is a  72 y.o. male with a diagnosis of Traumatic Dehiscence Right Below Knee Amputation who failed conservative measures and elected for surgical management.    The risks benefits and alternatives were discussed with the patient preoperatively including but not limited to the risks of infection, bleeding, nerve injury, cardiopulmonary complications, the need for revision surgery, among others, and the patient was willing to proceed.  OPERATIVE IMPLANTS:   Implant Name Type Inv. Item Serial No. Manufacturer Lot No. LRB No. Used Action  GRAFT SKIN WND MICRO 38 - [id number] Tissue GRAFT SKIN WND MICRO 38  [employer] [id number] Right 1 Implanted    @ENCIMAGES @  OPERATIVE FINDINGS: Large hematoma secondary to the traumatic wound dehiscence.  OPERATIVE PROCEDURE: Patient was brought the operating room and underwent a general anesthetic.  After adequate levels anesthesia were obtained patient's right lower extremity was prepped using DuraPrep draped into a sterile field a timeout was called.  Elliptical incision was made around the area of wound dehiscence this was carried sharply down to bone.  The distal 3 cm of tibia and fibula were resected back to healthy viable margins.  The tibia was beveled anteriorly.  The vascular bundles were clamped and suture-ligated with 2-0 silk.  Hemostasis was obtained.  The deep and superficial fascial layers and skin was closed using 2-0 nylon.  The Prevena customizable and [place] Border form wound VAC was applied this had a  good suction fit patient was taken the PACU in stable condition.   DISCHARGE PLANNING:  Antibiotic duration: Continue antibiotics for 24 hours  Weightbearing: Nonweightbearing on the right  Pain medication: Opioid pathway  Dressing care/ Wound VAC: Continue wound VAC for 1 week  Ambulatory devices: Walker  Discharge to: Discharge planning based on therapy recommendations.  Follow-up: In the office 1 week post operative.

## 2022-11-16 NOTE — Plan of Care (Signed)

## 2022-11-16 NOTE — Progress Notes (Signed)
[facility] PROGRESS NOTE   JAG LENZ HQI:[id number] DOB: 04-18-1951 DOA: 11/13/2022  PCP: Alysia Penna, MD  Brief History/Interval Summary: 72 y.o. male with medical history significant of chronic A-fib on Eliquis, chronic HFpEF, IIDM, CKD stage II, HTN, PPM, COPD, HLD, hypothyroidism, diabetic osteomyelitis status post right BKA with chronic poor healing right leg stump wound, presented with fall and dehiscence of the right BKA wound.  Patient was hospitalized for further management.  Orthopedics was consulted.  Consultants: Orthopedics  Procedures: None yet    Subjective/Interval History: Patient mentions that pain is reasonably well-controlled.  Looking forward to surgery today.  No new complaints offered.      Assessment/Plan:  Right BKA Stump with wound dehiscence Patient with history of recurrent right BKA ulcer and infection.  Most recent wound culture from June showed MRSA and MSSA.  Patient was started on vancomycin.  Also noted to be on cefazolin. Dr. Lajoyce Corners with orthopedics has been consulted who plans for revision surgery.  Eliquis on hold.   Plan is for surgery today.  Pain is well-controlled.  Acute on chronic diastolic CHF Thought to be in fluid overload at admission.  Was given furosemide with good diuresis.   Seems to be stable without evidence for volume overload currently.  Chest x-ray shows only vascular congestion without any clear pulmonary edema.   Hold off on further doses of furosemide for now.  Monitor volume status closely. Last echocardiogram is from February 2023 which showed normal LVEF. Saturations are normal on room air.  Acute kidney injury on chronic kidney disease stage II Baseline renal function seems to be around 1.5.  Came in with creatinine of 1.82.  Improved to 1.6.  Stable for the most part.  Recheck labs tomorrow. Monitor urine output.  Avoid nephrotoxic agents.  Paroxysmal atrial fibrillation In sinus rhythm  currently.  Holding Eliquis for upcoming surgery.  Noted to be on bisoprolol which is being continued.  Essential hypertension Noted to be on amlodipine.  Blood pressure is reasonably well-controlled.  Hypothyroidism Continue with levothyroxine.  Diabetes mellitus type 2 Monitor CBGs.  History of COPD on chronic steroids Stable.  Noted to be on steroids on a chronic basis which is being continued.  No evidence of exacerbation.  Continue with his inhalers.  Suspected foreign body in the right thumb This was incidentally noted on x-ray of the hand.  Nothing concerning noted on physical examination.  DVT Prophylaxis: Can be started back on his Eliquis after surgery once cleared by orthopedics.  Given subcutaneous heparin yesterday. Code Status: DNR Family Communication: Discussed with patient Disposition Plan: Anticipate return home when improved.  Status is: Inpatient Remains inpatient appropriate because: BKA wound dehiscence requiring surgical intervention      Medications: Scheduled:  acidophilus  2 capsule Oral TID   amLODipine  5 mg Oral q AM   bisoprolol  5 mg Oral Daily   chlorhexidine  60 mL Topical Once   dextromethorphan-guaiFENesin  2 tablet Oral Daily   diphenhydrAMINE  25 mg Oral Q24H   fluticasone furoate-vilanterol  1 puff Inhalation Daily   And   umeclidinium bromide  1 puff Inhalation Daily   gabapentin  600 mg Oral TID   heparin injection (subcutaneous)  5,000 Units Subcutaneous Q8H   insulin aspart  0-9 Units Subcutaneous TID WC   levothyroxine  175 mcg Oral QAC breakfast   pantoprazole  40 mg Oral QAC breakfast   potassium chloride SA  20 mEq Oral 2 times per  day every other day   predniSONE  2.5 mg Oral Q breakfast   rOPINIRole  1 mg Oral TID   rosuvastatin  10 mg Oral QHS   senna-docusate  1 tablet Oral BID   tranexamic acid (CYKLOKAPRON) 2,000 mg in sodium chloride 0.9 % 50 mL Topical Application  2,000 mg Topical To OR   traZODone  50 mg Oral  QHS   Continuous:   ceFAZolin (ANCEF) IV 2 g (11/16/22 1191)   vancomycin 1,000 mg (11/15/22 1648)   YNW:GNFAOZHYQ, HYDROmorphone (DILAUDID) injection, hydrOXYzine, Muscle Rub, ondansetron, oxyCODONE, polyethylene glycol powder, polyvinyl alcohol, sucralfate  Antibiotics: Anti-infectives (From admission, onward)    Start     Dose/Rate Route Frequency Ordered Stop   11/14/22 1500  vancomycin (VANCOCIN) IVPB 1000 mg/200 mL premix       Placed in "And" Linked Group   1,000 mg 100 mL/hr over 120 Minutes Intravenous Every 24 hours 11/13/22 1606     11/13/22 2200  ceFAZolin (ANCEF) IVPB 2g/100 mL premix        2 g 200 mL/hr over 30 Minutes Intravenous Every 8 hours 11/13/22 1606     11/13/22 1345  ceFEPIme (MAXIPIME) 2 g in sodium chloride 0.9 % 100 mL IVPB        2 g 200 mL/hr over 30 Minutes Intravenous  Once 11/13/22 1336 11/13/22 1554   11/13/22 1345  metroNIDAZOLE (FLAGYL) IVPB 500 mg        500 mg 100 mL/hr over 60 Minutes Intravenous  Once 11/13/22 1336 11/13/22 2004   11/13/22 1330  vancomycin (VANCOREADY) IVPB 1750 mg/350 mL        1,750 mg 87.5 mL/hr over 240 Minutes Intravenous  Once 11/13/22 1328 11/13/22 2144       Objective:  Vital Signs  Vitals:   11/16/22 0428 11/16/22 0819 11/16/22 0820 11/16/22 0838  BP: (!) 122/92   129/76  Pulse: 64   (!) 59  Resp: 16   17  Temp: (!) 97.5 F (36.4 C)     TempSrc:      SpO2: 97% 98% 98% 100%  Weight:      Height:        Intake/Output Summary (Last 24 hours) at 11/16/2022 0919 Last data filed at 11/16/2022 0310 Gross per 24 hour  Intake 758.61 ml  Output 800 ml  Net -41.39 ml   Filed Weights   11/13/22 1113 11/13/22 1118  Weight: 74.8 kg 74.8 kg   General appearance: Awake alert.  In no distress Resp: Clear to auscultation bilaterally.  Normal effort Cardio: S1-S2 is normal regular.  No S3-S4.  No rubs murmurs or bruit GI: Abdomen is soft.  Nontender nondistended.  Bowel sounds are present normal.  No masses  organomegaly Extremities: Right stump covered in dressing No obvious focal neurological deficits.   Lab Results:  Data Reviewed: I have personally reviewed following labs and reports of the imaging studies  CBC: Recent Labs  Lab 11/13/22 1102 11/14/22 0135 11/16/22 0239  WBC 7.3 10.5 8.4  NEUTROABS  --   --  6.1  HGB 11.8* 13.3 11.9*  HCT 38.1* 42.4 38.3*  MCV 92.0 91.8 89.3  PLT 156 185 160    Basic Metabolic Panel: Recent Labs  Lab 11/13/22 1102 11/14/22 0135 11/15/22 0450  NA 133* 132* 131*  K 4.4 4.6 4.5  CL 93* 93* 92*  CO2 26 25 26   GLUCOSE 88 102* 86  BUN 21 18 19   CREATININE 1.82* 1.58* 1.67*  CALCIUM 9.1 9.2 9.2    GFR: Estimated Creatinine Clearance: 41.7 mL/min (A) (by C-G formula based on SCr of 1.67 mg/dL (H)).  Liver Function Tests: Recent Labs  Lab 11/13/22 1102  AST 34  ALT 25  ALKPHOS 52  BILITOT 0.2*  PROT 7.1  ALBUMIN 3.9    HbA1C: Recent Labs    11/13/22 1805  HGBA1C 6.3*    CBG: Recent Labs  Lab 11/15/22 0756 11/15/22 1123 11/15/22 1607 11/15/22 2134 11/16/22 0840  GLUCAP 85 142* 116* 99 92    Radiology Studies: No results found.     LOS: 3 days   Hadi Dubin [employer] on [email or web address]  11/16/2022, 9:19 AM

## 2022-11-16 NOTE — Anesthesia Procedure Notes (Signed)
Procedure Name: Intubation Date/Time: 11/16/2022 11:46 AM  Performed by: Lelon Perla, CRNAPre-anesthesia Checklist: Patient identified, Emergency Drugs available, Suction available and Patient being monitored Patient Re-evaluated:Patient Re-evaluated prior to induction Oxygen Delivery Method: Circle system utilized Preoxygenation: Pre-oxygenation with 100% oxygen Induction Type: IV induction, Cricoid Pressure applied and Rapid sequence Laryngoscope Size: Mac and 4 Grade View: Grade I Tube type: Oral Tube size: 7.5 mm Number of attempts: 1 Airway Equipment and Method: Stylet and Oral airway Placement Confirmation: ETT inserted through vocal cords under direct vision, positive ETCO2 and breath sounds checked- equal and bilateral Secured at: 22 cm Tube secured with: Tape Dental Injury: Teeth and Oropharynx as per pre-operative assessment

## 2022-11-16 NOTE — Transfer of Care (Signed)
Immediate Anesthesia Transfer of Care Note  Patient: [name]  Procedure(s) Performed: REVISION RIGHT BELOW KNEE AMPUTATION (Right)  Patient Location: PACU  Anesthesia Type:General  Level of Consciousness: awake, alert , and oriented  Airway & Oxygen Therapy: Patient Spontanous Breathing  Post-op Assessment: Report given to RN and Post -op Vital signs reviewed and stable  Post vital signs: Reviewed and stable  Last Vitals:  Vitals Value Taken Time  BP    Temp    Pulse    Resp    SpO2      Last Pain:  Vitals:   11/16/22 1040  TempSrc: Oral  PainSc:       Patients Stated Pain Goal: 1 (11/13/22 1614)  Complications: No notable events documented.

## 2022-11-16 NOTE — Interval H&P Note (Signed)
History and Physical Interval Note:  11/16/2022 7:38 AM  [name]  has presented today for surgery, with the diagnosis of Traumatic Dehiscence Right Below Knee Amputation.  The various methods of treatment have been discussed with the patient and family. After consideration of risks, benefits and other options for treatment, the patient has consented to  Procedure(s): REVISION RIGHT BELOW KNEE AMPUTATION (Right) as a surgical intervention.  The patient's history has been reviewed, patient examined, no change in status, stable for surgery.  I have reviewed the patient's chart and labs.  Questions were answered to the patient's satisfaction.     [name]

## 2022-11-17 DIAGNOSIS — D649 Anemia, unspecified: Secondary | ICD-10-CM

## 2022-11-17 DIAGNOSIS — N179 Acute kidney failure, unspecified: Secondary | ICD-10-CM | POA: Diagnosis not present

## 2022-11-17 DIAGNOSIS — T8781 Dehiscence of amputation stump: Secondary | ICD-10-CM | POA: Diagnosis not present

## 2022-11-17 DIAGNOSIS — I480 Paroxysmal atrial fibrillation: Secondary | ICD-10-CM | POA: Diagnosis not present

## 2022-11-17 LAB — CBC
HCT: 30.5 % — ABNORMAL LOW (ref 39.0–52.0)
Hemoglobin: 9.9 g/dL — ABNORMAL LOW (ref 13.0–17.0)
MCH: 28.9 pg (ref 26.0–34.0)
MCHC: 32.5 g/dL (ref 30.0–36.0)
MCV: 88.9 fL (ref 80.0–100.0)
Platelets: 159 10*3/uL (ref 150–400)
RBC: 3.43 MIL/uL — ABNORMAL LOW (ref 4.22–5.81)
RDW: 14.7 % (ref 11.5–15.5)
WBC: 10 10*3/uL (ref 4.0–10.5)
nRBC: 0 % (ref 0.0–0.2)

## 2022-11-17 LAB — BASIC METABOLIC PANEL
Anion gap: 8 (ref 5–15)
BUN: 36 mg/dL — ABNORMAL HIGH (ref 8–23)
CO2: 25 mmol/L (ref 22–32)
Calcium: 8.9 mg/dL (ref 8.9–10.3)
Chloride: 97 mmol/L — ABNORMAL LOW (ref 98–111)
Creatinine, Ser: 2.08 mg/dL — ABNORMAL HIGH (ref 0.61–1.24)
GFR, Estimated: 33 mL/min — ABNORMAL LOW (ref >60)
Glucose, Bld: 119 mg/dL — ABNORMAL HIGH (ref 70–99)
Potassium: 4.7 mmol/L (ref 3.5–5.1)
Sodium: 130 mmol/L — ABNORMAL LOW (ref 135–145)

## 2022-11-17 LAB — GLUCOSE, CAPILLARY
Glucose-Capillary: 106 mg/dL — ABNORMAL HIGH (ref 70–99)
Glucose-Capillary: 112 mg/dL — ABNORMAL HIGH (ref 70–99)
Glucose-Capillary: 111 mg/dL — ABNORMAL HIGH (ref 70–99)
Glucose-Capillary: 141 mg/dL — ABNORMAL HIGH (ref 70–99)

## 2022-11-17 LAB — VANCOMYCIN, TROUGH: Vancomycin Tr: 11 ug/mL — ABNORMAL LOW (ref 15–20)

## 2022-11-17 MED ORDER — SODIUM CHLORIDE 0.9 % IV BOLUS
500.0000 mL | Freq: Once | INTRAVENOUS | Status: AC
  Administered 2022-11-17: 500 mL via INTRAVENOUS

## 2022-11-17 MED ORDER — SODIUM CHLORIDE 0.9 % IV SOLN: INTRAVENOUS | Status: AC

## 2022-11-17 NOTE — Plan of Care (Signed)
Problem: Education: Goal: Ability to describe self-care measures that may prevent or decrease complications (Diabetes Survival Skills Education) will improve 11/17/2022 1931 by Mateo Flow, RN Outcome: Progressing 11/17/2022 1931 by Mateo Flow, RN Outcome: Progressing Goal: Individualized Educational Video(s) 11/17/2022 1931 by Mateo Flow, RN Outcome: Progressing 11/17/2022 1931 by Mateo Flow, RN Outcome: Progressing   Problem: Coping: Goal: Ability to adjust to condition or change in health will improve 11/17/2022 1931 by Mateo Flow, RN Outcome: Progressing 11/17/2022 1931 by Mateo Flow, RN Outcome: Progressing   Problem: Fluid Volume: Goal: Ability to maintain a balanced intake and output will improve 11/17/2022 1931 by Mateo Flow, RN Outcome: Progressing 11/17/2022 1931 by Mateo Flow, RN Outcome: Progressing   Problem: Health Behavior/Discharge Planning: Goal: Ability to identify and utilize available resources and services will improve 11/17/2022 1931 by Mateo Flow, RN Outcome: Progressing 11/17/2022 1931 by Mateo Flow, RN Outcome: Progressing Goal: Ability to manage health-related needs will improve 11/17/2022 1931 by Mateo Flow, RN Outcome: Progressing 11/17/2022 1931 by Mateo Flow, RN Outcome: Progressing   Problem: Metabolic: Goal: Ability to maintain appropriate glucose levels will improve 11/17/2022 1931 by Mateo Flow, RN Outcome: Progressing 11/17/2022 1931 by Mateo Flow, RN Outcome: Progressing   Problem: Nutritional: Goal: Maintenance of adequate nutrition will improve 11/17/2022 1931 by Mateo Flow, RN Outcome: Progressing 11/17/2022 1931 by Mateo Flow, RN Outcome: Progressing Goal: Progress toward achieving an optimal weight will improve 11/17/2022 1931 by Mateo Flow, RN Outcome: Progressing 11/17/2022 1931 by Mateo Flow, RN Outcome: Progressing   Problem:  Skin Integrity: Goal: Risk for impaired skin integrity will decrease 11/17/2022 1931 by Mateo Flow, RN Outcome: Progressing 11/17/2022 1931 by Mateo Flow, RN Outcome: Progressing   Problem: Tissue Perfusion: Goal: Adequacy of tissue perfusion will improve 11/17/2022 1931 by Mateo Flow, RN Outcome: Progressing 11/17/2022 1931 by Mateo Flow, RN Outcome: Progressing   Problem: Education: Goal: Knowledge of General Education information will improve Description: Including pain rating scale, medication(s)/side effects and non-pharmacologic comfort measures 11/17/2022 1931 by Mateo Flow, RN Outcome: Progressing 11/17/2022 1931 by Mateo Flow, RN Outcome: Progressing   Problem: Health Behavior/Discharge Planning: Goal: Ability to manage health-related needs will improve 11/17/2022 1931 by Mateo Flow, RN Outcome: Progressing 11/17/2022 1931 by Mateo Flow, RN Outcome: Progressing   Problem: Clinical Measurements: Goal: Ability to maintain clinical measurements within normal limits will improve 11/17/2022 1931 by Mateo Flow, RN Outcome: Progressing 11/17/2022 1931 by Mateo Flow, RN Outcome: Progressing Goal: Will remain free from infection 11/17/2022 1931 by Mateo Flow, RN Outcome: Progressing 11/17/2022 1931 by Mateo Flow, RN Outcome: Progressing Goal: Diagnostic test results will improve 11/17/2022 1931 by Mateo Flow, RN Outcome: Progressing 11/17/2022 1931 by Mateo Flow, RN Outcome: Progressing Goal: Respiratory complications will improve 11/17/2022 1931 by Mateo Flow, RN Outcome: Progressing 11/17/2022 1931 by Mateo Flow, RN Outcome: Progressing Goal: Cardiovascular complication will be avoided 11/17/2022 1931 by Mateo Flow, RN Outcome: Progressing 11/17/2022 1931 by Mateo Flow, RN Outcome: Progressing   Problem: Activity: Goal: Risk for activity intolerance will  decrease 11/17/2022 1931 by Mateo Flow, RN Outcome: Progressing 11/17/2022 1931 by Mateo Flow, RN Outcome: Progressing   Problem: Nutrition: Goal: Adequate nutrition will be maintained 11/17/2022 1931 by Mateo Flow, RN Outcome: Progressing 11/17/2022 1931 by Mateo Flow, RN Outcome: Progressing  Problem: Coping: Goal: Level of anxiety will decrease 11/17/2022 1931 by Mateo Flow, RN Outcome: Progressing 11/17/2022 1931 by Mateo Flow, RN Outcome: Progressing   Problem: Elimination: Goal: Will not experience complications related to bowel motility 11/17/2022 1931 by Mateo Flow, RN Outcome: Progressing 11/17/2022 1931 by Mateo Flow, RN Outcome: Progressing Goal: Will not experience complications related to urinary retention 11/17/2022 1931 by Mateo Flow, RN Outcome: Progressing 11/17/2022 1931 by Mateo Flow, RN Outcome: Progressing   Problem: Pain Managment: Goal: General experience of comfort will improve 11/17/2022 1931 by Mateo Flow, RN Outcome: Progressing 11/17/2022 1931 by Mateo Flow, RN Outcome: Progressing   Problem: Safety: Goal: Ability to remain free from injury will improve 11/17/2022 1931 by Mateo Flow, RN Outcome: Progressing 11/17/2022 1931 by Mateo Flow, RN Outcome: Progressing   Problem: Skin Integrity: Goal: Risk for impaired skin integrity will decrease 11/17/2022 1931 by Mateo Flow, RN Outcome: Progressing 11/17/2022 1931 by Mateo Flow, RN Outcome: Progressing   Problem: Education: Goal: Knowledge of the prescribed therapeutic regimen will improve 11/17/2022 1931 by Mateo Flow, RN Outcome: Progressing 11/17/2022 1931 by Mateo Flow, RN Outcome: Progressing Goal: Ability to verbalize activity precautions or restrictions will improve 11/17/2022 1931 by Mateo Flow, RN Outcome: Progressing 11/17/2022 1931 by Mateo Flow, RN Outcome:  Progressing Goal: Understanding of discharge needs will improve 11/17/2022 1931 by Mateo Flow, RN Outcome: Progressing 11/17/2022 1931 by Mateo Flow, RN Outcome: Progressing   Problem: Activity: Goal: Ability to perform//tolerate increased activity and mobilize with assistive devices will improve 11/17/2022 1931 by Mateo Flow, RN Outcome: Progressing 11/17/2022 1931 by Mateo Flow, RN Outcome: Progressing   Problem: Clinical Measurements: Goal: Postoperative complications will be avoided or minimized 11/17/2022 1931 by Mateo Flow, RN Outcome: Progressing 11/17/2022 1931 by Mateo Flow, RN Outcome: Progressing   Problem: Self-Care: Goal: Ability to meet self-care needs will improve Outcome: Progressing   Problem: Self-Concept: Goal: Ability to maintain and perform role responsibilities to the fullest extent possible will improve Outcome: Progressing   Problem: Pain Management: Goal: Pain level will decrease with appropriate interventions Outcome: Progressing   Problem: Skin Integrity: Goal: Demonstration of wound healing without infection will improve Outcome: Progressing

## 2022-11-17 NOTE — Progress Notes (Signed)
[facility] PROGRESS NOTE   [name]:[id number] DOB: 1950/08/26 DOA: 11/13/2022  PCP: Alysia Penna, MD  Brief History/Interval Summary: 72 y.o. male with medical history significant of chronic A-fib on Eliquis, chronic HFpEF, IIDM, CKD stage II, HTN, PPM, COPD, HLD, hypothyroidism, diabetic osteomyelitis status post right BKA with chronic poor healing right leg stump wound, presented with fall and dehiscence of the right BKA wound.  Patient was hospitalized for further management.  Orthopedics was consulted.  Consultants: Orthopedics  Procedures: Revision of the right below-knee amputation on 7/20    Subjective/Interval History: According to nursing report patient was trying to get out of bed overnight.  He had to be moved to room which was closer to the nursing station.  Seems to be a little distracted this morning but was easily reoriented.  Denies any significant pain in the right lower extremity currently.     Assessment/Plan:  Right BKA Stump with wound dehiscence Patient with history of recurrent right BKA ulcer and infection.  Most recent wound culture from June showed MRSA and MSSA.   Underwent right BKA revision surgery on 7/20. Resume Eliquis when cleared by orthopedics. Noted to be on vancomycin.  Will discuss with Dr. Lajoyce Corners regarding antibiotics going forward.  Acute on chronic diastolic CHF Thought to be in fluid overload at admission.  Was given furosemide with good diuresis.   Seems to be stable without evidence for volume overload currently.  Chest x-ray shows only vascular congestion without any clear pulmonary edema.   Hold off on further doses of furosemide for now.  Monitor volume status closely. Last echocardiogram is from February 2023 which showed normal LVEF. Saturations are normal on room air.  Acute kidney injury on chronic kidney disease stage II Baseline renal function seems to be around 1.5.  Came in with creatinine of 1.82.   Improved to 1.6.  Noted to be higher today at 2.08.  Likely due to n.p.o. status yesterday.  Give him IV fluids for 24 hours.  Recheck labs tomorrow.  Monitor urine output.    Normocytic anemia/acute blood loss anemia Drop in hemoglobin is noted.  Likely due to surgical loss.  No other overt bleeding noted.  Continue to monitor daily.  Paroxysmal atrial fibrillation In sinus rhythm currently.  Eliquis was placed on hold.  Resume once cleared to do so by orthopedics.  Continue with bisoprolol.   Essential hypertension Noted to be on amlodipine.  Blood pressure is reasonably well-controlled.  Hypothyroidism Continue with levothyroxine.  Diabetes mellitus type 2 Monitor CBGs.  History of COPD on chronic steroids Stable.  Noted to be on steroids on a chronic basis which is being continued.  No evidence of exacerbation.  Continue with his inhalers.  Suspected foreign body in the right thumb This was incidentally noted on x-ray of the hand.  Nothing concerning noted on physical examination.  DVT Prophylaxis: Can be started back on his Eliquis after surgery once cleared by orthopedics.   Code Status: DNR Family Communication: Discussed with patient Disposition Plan: Anticipate return home when improved.  Status is: Inpatient Remains inpatient appropriate because: BKA wound dehiscence requiring surgical intervention      Medications: Scheduled:  acidophilus  2 capsule Oral TID   amLODipine  5 mg Oral q AM   vitamin C  1,000 mg Oral Daily   bisoprolol  5 mg Oral Daily   dextromethorphan-guaiFENesin  2 tablet Oral Daily   diphenhydrAMINE  25 mg Oral Q24H   docusate sodium  100 mg Oral Daily   fluticasone furoate-vilanterol  1 puff Inhalation Daily   And   umeclidinium bromide  1 puff Inhalation Daily   gabapentin  600 mg Oral TID   insulin aspart  0-9 Units Subcutaneous TID WC   levothyroxine  175 mcg Oral QAC breakfast   nutrition supplement (JUVEN)  1 packet Oral BID BM    pantoprazole  40 mg Oral Daily   potassium chloride SA  20 mEq Oral 2 times per day every other day   predniSONE  2.5 mg Oral Q breakfast   rOPINIRole  1 mg Oral TID   rosuvastatin  10 mg Oral QHS   senna-docusate  1 tablet Oral BID   traZODone  50 mg Oral QHS   zinc sulfate  220 mg Oral Daily   Continuous:  sodium chloride 100 mL/hr at 11/17/22 6962   magnesium sulfate bolus IVPB     vancomycin 1,000 mg (11/16/22 1449)   XBM:[id number], albuterol, alum & mag hydroxide-simeth, bisacodyl, guaiFENesin-dextromethorphan, hydrALAZINE, HYDROmorphone (DILAUDID) injection, hydrOXYzine, labetalol, magnesium citrate, magnesium sulfate bolus IVPB, metoprolol tartrate, Muscle Rub, ondansetron, ondansetron, oxyCODONE, oxyCODONE, phenol, polyethylene glycol, polyvinyl alcohol, potassium chloride, sucralfate  Antibiotics: Anti-infectives (From admission, onward)    Start     Dose/Rate Route Frequency Ordered Stop   11/16/22 2000  ceFAZolin (ANCEF) IVPB 2g/100 mL premix        2 g 200 mL/hr over 30 Minutes Intravenous Every 8 hours 11/16/22 1409 11/17/22 0409   11/16/22 1045  ceFAZolin (ANCEF) IVPB 2g/100 mL premix        2 g 200 mL/hr over 30 Minutes Intravenous On call to O.R. 11/16/22 1044 11/16/22 1157   11/16/22 1045  ceFAZolin (ANCEF) IVPB 2g/100 mL premix  Status:  Discontinued        2 g 200 mL/hr over 30 Minutes Intravenous On call to O.R. 11/16/22 1044 11/16/22 1123   11/14/22 1500  vancomycin (VANCOCIN) IVPB 1000 mg/200 mL premix       Placed in "And" Linked Group   1,000 mg 100 mL/hr over 120 Minutes Intravenous Every 24 hours 11/13/22 1606     11/13/22 2200  ceFAZolin (ANCEF) IVPB 2g/100 mL premix  Status:  Discontinued        2 g 200 mL/hr over 30 Minutes Intravenous Every 8 hours 11/13/22 1606 11/16/22 1413   11/13/22 1345  ceFEPIme (MAXIPIME) 2 g in sodium chloride 0.9 % 100 mL IVPB        2 g 200 mL/hr over 30 Minutes Intravenous  Once 11/13/22 1336 11/13/22 1554   11/13/22  1345  metroNIDAZOLE (FLAGYL) IVPB 500 mg        500 mg 100 mL/hr over 60 Minutes Intravenous  Once 11/13/22 1336 11/13/22 2004   11/13/22 1330  vancomycin (VANCOREADY) IVPB 1750 mg/350 mL        1,750 mg 87.5 mL/hr over 240 Minutes Intravenous  Once 11/13/22 1328 11/13/22 2144       Objective:  Vital Signs  Vitals:   11/16/22 2017 11/17/22 0006 11/17/22 0516 11/17/22 0832  BP: 123/73 125/63 120/70 123/61  Pulse: 63 60 63 (!) 56  Resp: 16 16 16 18   Temp: 98.1 F (36.7 C) 98.4 F (36.9 C) 97.6 F (36.4 C) 98.3 F (36.8 C)  TempSrc: Oral   Oral  SpO2: 99% 95% 100% 96%  Weight:      Height:        Intake/Output Summary (Last 24 hours) at 11/17/2022 0959 Last  data filed at 11/17/2022 0600 Gross per 24 hour  Intake 2410.32 ml  Output 1700 ml  Net 710.32 ml   Filed Weights   11/13/22 1113 11/13/22 1118 11/16/22 1040  Weight: 74.8 kg 74.8 kg 88.9 kg    General appearance: Awake alert.  In no distress Resp: Clear to auscultation bilaterally.  Normal effort Cardio: S1-S2 is normal regular.  No S3-S4.  No rubs murmurs or bruit GI: Abdomen is soft.  Nontender nondistended.  Bowel sounds are present normal.  No masses organomegaly Extremities: No edema.  Wound VAC noted right BKA stump   Lab Results:  Data Reviewed: I have personally reviewed following labs and reports of the imaging studies  CBC: Recent Labs  Lab 11/13/22 1102 11/14/22 0135 11/16/22 0239 11/17/22 0314  WBC 7.3 10.5 8.4 10.0  NEUTROABS  --   --  6.1  --   HGB 11.8* 13.3 11.9* 9.9*  HCT 38.1* 42.4 38.3* 30.5*  MCV 92.0 91.8 89.3 88.9  PLT 156 185 160 159    Basic Metabolic Panel: Recent Labs  Lab 11/13/22 1102 11/14/22 0135 11/15/22 0450 11/17/22 0314  NA 133* 132* 131* 130*  K 4.4 4.6 4.5 4.7  CL 93* 93* 92* 97*  CO2 26 25 26 25   GLUCOSE 88 102* 86 119*  BUN 21 18 19  36*  CREATININE 1.82* 1.58* 1.67* 2.08*  CALCIUM 9.1 9.2 9.2 8.9    GFR: Estimated Creatinine Clearance: 33.7  mL/min (A) (by C-G formula based on SCr of 2.08 mg/dL (H)).  Liver Function Tests: Recent Labs  Lab 11/13/22 1102  AST 34  ALT 25  ALKPHOS 52  BILITOT 0.2*  PROT 7.1  ALBUMIN 3.9    CBG: Recent Labs  Lab 11/16/22 1243 11/16/22 1403 11/16/22 1704 11/16/22 2017 11/17/22 0752  GLUCAP 123* 149* 186* 113* 106*    Radiology Studies: No results found.     LOS: 4 days   Dela Sweeny [employer] on [email or web address]  11/17/2022, 9:59 AM

## 2022-11-17 NOTE — Plan of Care (Signed)
  Problem: Education: Goal: Ability to describe self-care measures that may prevent or decrease complications (Diabetes Survival Skills Education) will improve Outcome: Progressing   Problem: Coping: Goal: Ability to adjust to condition or change in health will improve Outcome: Progressing   Problem: Fluid Volume: Goal: Ability to maintain a balanced intake and output will improve Outcome: Progressing   Problem: Health Behavior/Discharge Planning: Goal: Ability to identify and utilize available resources and services will improve Outcome: Progressing   Problem: Metabolic: Goal: Ability to maintain appropriate glucose levels will improve Outcome: Progressing   Problem: Skin Integrity: Goal: Risk for impaired skin integrity will decrease Outcome: Progressing   Problem: Coping: Goal: Level of anxiety will decrease Outcome: Progressing

## 2022-11-17 NOTE — Evaluation (Signed)
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 PMHx: COPD, afib, fibromyalgia, CHF, HTN, HLD, pacer, back surgery, RLS, 2nd degree AV block, asthma, fatty liver, mitral regurg,   Clinical Impression   Pt admitted for above, PTA pt would pivot into w/c and reports having assist with bADLs at baseline. Pt reports he could probably do more for himself, needing min guard with LB bADLs sitting EOB and min A to min guard for STS but not able to obtain hop to gait pattern today. OOB mobility further limited until pt were to receive ampushield from home. Pt looking to obtain prosthesis and would benefit from more strengthening, OT will continue to follow pt acutely to address deficits and help transition to next level of care. No follow-up OT recommended at this time, to return home with support of family at DC.      Recommendations for follow up therapy are one component of a multi-disciplinary discharge planning process, led by the attending physician.  Recommendations may be updated based on patient status, additional functional criteria and insurance authorization.   Assistance Recommended at Discharge Intermittent Supervision/Assistance  Patient can return home with the following A little help with walking and/or transfers;Assistance with cooking/housework;A little help with bathing/dressing/bathroom;Help with stairs or ramp for entrance;Assist for transportation    Functional Status Assessment  Patient has had a recent decline in their functional status and demonstrates the ability to make significant improvements in function in a reasonable and predictable amount of time.  Equipment Recommendations  BSC/3in1    Recommendations for  Other Services       Precautions / Restrictions Precautions Precautions: Fall Restrictions Weight Bearing Restrictions: Yes RLE Weight Bearing: Non weight bearing      Mobility Bed Mobility Overal bed mobility: Needs Assistance Bed Mobility: Supine to Sit, Sit to Supine     Supine to sit: Supervision Sit to supine: Supervision        Transfers Overall transfer level: Needs assistance Equipment used: Rolling walker (2 wheels) Transfers: Sit to/from Stand Sit to Stand: Min assist, Min guard           General transfer comment: Pt initially needing Min A + RW, progress to Min guard with use of bed rails. STSx3 from EOB      Balance Overall balance assessment: Needs assistance Sitting-balance support: Bilateral upper extremity supported, Feet supported Sitting balance-Leahy Scale: Fair     Standing balance support: Bilateral upper extremity supported, Reliant on assistive device for balance Standing balance-Leahy Scale: Poor Standing balance comment: RW support, pt not comfortable to with hopping at this time                           ADL either performed or assessed with clinical judgement   ADL Overall ADL's : Needs assistance/impaired Eating/Feeding: Independent;Sitting   Grooming: Sitting;Min guard   Upper Body Bathing: Independent;Sitting   Lower Body Bathing: Sitting/lateral leans;Min guard   Upper Body Dressing : Independent;Sitting   Lower Body Dressing: Min guard;Sitting/lateral leans Lower Body Dressing Details (indicate cue type and reason): challenge with balance but able to don socks   Toilet Transfer Details (indicate cue type and reason): defer  General ADL Comments: Pt standing at bedside but no ampushield in room to protect R BKA during mobility, he reports that his wife can bring it later. OOB mobility limited to standing at bedside successively to promote strengthening.     Vision         Perception      Praxis      Pertinent Vitals/Pain Pain Assessment Pain Assessment: Faces Faces Pain Scale: Hurts even more Pain Location: R stump Pain Descriptors / Indicators: Aching, Discomfort, Sore Pain Intervention(s): Repositioned, Limited activity within patient's tolerance     Hand Dominance Right   Extremity/Trunk Assessment Upper Extremity Assessment Upper Extremity Assessment: Overall WFL for tasks assessed   Lower Extremity Assessment Lower Extremity Assessment: Defer to PT evaluation       Communication Communication Communication: No difficulties   Cognition Arousal/Alertness: Awake/alert Behavior During Therapy: WFL for tasks assessed/performed Overall Cognitive Status: Within Functional Limits for tasks assessed                                       General Comments  VSS on RA    Exercises     Shoulder Instructions      Home Living Family/patient expects to be discharged to:: Private residence Living Arrangements: Spouse/significant other Available Help at Discharge: Available 24 hours/day Type of Home: House Home Access: Ramped entrance     Home Layout: One level     Bathroom Shower/Tub: Producer, television/film/video: Standard     Home Equipment: Agricultural consultant (2 wheels);Wheelchair - Sport and exercise psychologist Comments: several large dogs, 2 falls in the last year both tripping over something      Prior Functioning/Environment Prior Level of Function : Needs assist;History of Falls (last six months)       Physical Assist : Mobility (physical) Mobility (physical): Transfers;Gait   Mobility Comments: wheelchair for mobility mostly via stand pivots ADLs Comments: wife helps with bathing and dressing        OT Problem List: Impaired balance (sitting and/or standing);Pain      OT Treatment/Interventions: Self-care/ADL training;Therapeutic activities;DME and/or AE instruction;Patient/family education;Balance training     OT Goals(Current goals can be found in the care plan section) Acute Rehab OT Goals Patient Stated Goal: to go home OT Goal Formulation: With patient Time For Goal Achievement: 12/01/22 Potential to Achieve Goals: Good ADL Goals Pt Will Perform Grooming: sitting;with set-up;with supervision Pt Will Perform Lower Body Bathing: sitting/lateral leans;with supervision Pt Will Perform Lower Body Dressing: with supervision;sitting/lateral leans Pt Will Transfer to Toilet: with min guard assist;stand pivot transfer  OT Frequency: Min 1X/week    Co-evaluation              AM-PAC OT "6 Clicks" Daily Activity     Outcome Measure Help from another person eating meals?: None Help from another person taking care of personal grooming?: A Little Help from another person toileting, which includes using toliet, bedpan, or urinal?: A Little Help from another person bathing (including washing, rinsing, drying)?: A Little Help from another person to put on and taking off regular upper body clothing?: None Help from another person to put on and taking off regular lower body clothing?: A Little 6 Click Score: 20   End of Session Equipment Utilized During Treatment: Gait belt;Rolling walker (2 wheels) Nurse Communication: Mobility status  Activity Tolerance: Patient tolerated treatment well Patient left: in  bed;with call bell/phone within reach;with bed alarm set  OT Visit Diagnosis: Unsteadiness on feet (R26.81);Other abnormalities of gait and mobility (R26.89);Pain Pain - Right/Left: Right Pain - part of body: Knee                Time: 1205-1230 OT Time Calculation (min): 25 min Charges:  OT General Charges $OT Visit: 1 Visit OT Evaluation $OT Eval Moderate Complexity: 1 Mod OT Treatments $Therapeutic Activity: 8-22 mins  11/17/2022  AB, OTR/L  Acute Rehabilitation Services  Office: [phone]   Tristan Schroeder 11/17/2022, 12:48 PM

## 2022-11-17 NOTE — Evaluation (Addendum)
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 PMHx: COPD, afib, fibromyalgia, CHF, HTN, HLD, pacer, back surgery, RLS, 2nd degree AV block, asthma, fatty liver, mitral regurg,  Clinical Impression  PTA pt was independent in transfers to wheelchair, and for short distance hop to ambulation with RW. Pt is currently limited in safe mobility by R LE pain increased in dependent position. Pt is currently supervision for bed mobility and min guard for transfer from bed to chair. PT will continue to follow acutely.         Assistance Recommended at Discharge Set up Supervision/Assistance  If plan is discharge home, recommend the following:  Can travel by private vehicle  A little help with walking and/or transfers;A little help with bathing/dressing/bathroom;Assist for transportation;Help with stairs or ramp for entrance;Assistance with cooking/housework        Equipment Recommendations None recommended by PT  Recommendations for Other Services       Functional Status Assessment Patient has had a recent decline in their functional status and demonstrates the ability to make significant improvements in function in a reasonable and predictable amount of time.     Precautions / Restrictions Precautions Precautions: Fall Restrictions Weight Bearing Restrictions: Yes RLE Weight Bearing: Non weight bearing      Mobility  Bed Mobility Overal bed mobility: Needs Assistance Bed Mobility: Supine to Sit     Supine to sit: Supervision     General bed mobility comments: supervision to come to EoB    Transfers Overall transfer level: Needs assistance Equipment used: Rolling walker (2 wheels) Transfers: Sit to/from Stand, Bed to  chair/wheelchair/BSC Sit to Stand: Min guard Stand pivot transfers: Min guard         General transfer comment: min guard for power up to RW and self steadying, min guard to pivot to recliner        Balance Overall balance assessment: Needs assistance Sitting-balance support: Bilateral upper extremity supported, Feet supported Sitting balance-Leahy Scale: Fair     Standing balance support: Bilateral upper extremity supported, Reliant on assistive device for balance Standing balance-Leahy Scale: Poor Standing balance comment: RW support, pt not comfortable to with hopping at this time                             Pertinent Vitals/Pain Pain Assessment Pain Assessment: Faces Faces Pain Scale: Hurts little more Breathing: normal Negative Vocalization: none Facial Expression: smiling or inexpressive Body Language: relaxed Consolability: no need to console PAINAD Score: 0 Pain Location: R stump Pain Descriptors / Indicators: Aching, Discomfort, Sore Pain Intervention(s): Limited activity within patient's tolerance, Monitored during session, Repositioned    Home Living Family/patient expects to be discharged to:: Private residence Living Arrangements: Spouse/significant other Available Help at Discharge: Available 24 hours/day Type of Home: House Home Access: Ramped entrance       Home Layout: One level Home Equipment: Agricultural consultant (2 wheels);Wheelchair - Lawyer Comments: several large dogs, 2 falls in the last year both tripping over something    Prior Function Prior Level of Function : Needs assist;History of Falls (last six months)       Physical Assist : Mobility (physical) Mobility (physical): Transfers;Gait   Mobility Comments:  wheelchair for mobility mostly via stand pivots ADLs Comments: wife helps with bathing and dressing     Hand Dominance   Dominant Hand: Right    Extremity/Trunk Assessment   Upper Extremity  Assessment Upper Extremity Assessment: Defer to OT evaluation    Lower Extremity Assessment Lower Extremity Assessment: RLE deficits/detail RLE Deficits / Details: R BKA revision, knee flex/ext limited by edema       Communication   Communication: No difficulties  Cognition Arousal/Alertness: Awake/alert Behavior During Therapy: WFL for tasks assessed/performed Overall Cognitive Status: Within Functional Limits for tasks assessed                                          General Comments General comments (skin integrity, edema, etc.): VSS on RA        Assessment/Plan    PT Assessment Patient needs continued PT services  PT Problem List Decreased range of motion;Decreased balance;Decreased mobility;Decreased safety awareness;Pain       PT Treatment Interventions DME instruction;Gait training;Functional mobility training;Therapeutic activities;Therapeutic exercise;Balance training;Cognitive remediation;Patient/family education;Neuromuscular re-education    PT Goals (Current goals can be found in the Care Plan section)  Acute Rehab PT Goals Patient Stated Goal: get back outside PT Goal Formulation: With patient Time For Goal Achievement: 12/01/22 Potential to Achieve Goals: Fair    Frequency Min 3X/week        AM-PAC PT "6 Clicks" Mobility  Outcome Measure Help needed turning from your back to your side while in a flat bed without using bedrails?: None Help needed moving from lying on your back to sitting on the side of a flat bed without using bedrails?: None Help needed moving to and from a bed to a chair (including a wheelchair)?: None Help needed standing up from a chair using your arms (e.g., wheelchair or bedside chair)?: None Help needed to walk in hospital room?: A Little Help needed climbing 3-5 steps with a railing? : Total 6 Click Score: 20    End of Session Equipment Utilized During Treatment: Gait belt Activity Tolerance: Patient  tolerated treatment well Patient left: in chair;with call bell/phone within reach;with chair alarm set Nurse Communication: Mobility status PT Visit Diagnosis: Unsteadiness on feet (R26.81);Other abnormalities of gait and mobility (R26.89);History of falling (Z91.81);Pain Pain - Right/Left: Right Pain - part of body: Leg    Time: 1421-1443 PT Time Calculation (min) (ACUTE ONLY): 22 min   Charges:   PT Evaluation $PT Eval Low Complexity: 1 Low   PT General Charges $$ ACUTE PT VISIT: 1 Visit         Alphonso Gregson B. Beverely Risen PT, DPT Acute Rehabilitation Services Please use secure chat or  Call Office [phone]   Elon Alas [facility] 11/17/2022, 3:01 PM

## 2022-11-17 NOTE — Progress Notes (Signed)
Pharmacy Antibiotic Note  [name] is a 72 y.o. male admitted on 11/13/2022 with  R BKA stump infection .  Pharmacy has been consulted for Vancomycin dosing.  Patient received one-time doses of cefepime 2g IV, metronidazole 500mg  IV, and vancomycin 1750mg  IV in the ED. Cefazolin stopped yesterday postoperatively. Continues on vancomycin monotherapy.  WBC 10, remains afebrile SCr 2.08 (baseline SCr ~1.4-1.5)  Plan: S/p 3 doses of Vancomycin 1000mg  IV q24h (initial eAUC ~514)    > Goal AUC 400-550, goal trough 10-15 based on indication.    > Increase in Scr (1.67>2.08) today concerning for accumulation of vancomycin.  Trough today 7/21 at 1400 was 11. Continue with vancomycin 1000mg  IV q24h for now and follow up with orthopedic surgery about duration of antibiotics in the morning (7/22).  Monitor daily CBC, temp, SCr, and for clinical signs of improvement    Height: 5\' 11"  (180.3 cm) Weight: 88.9 kg (196 lb) IBW/kg (Calculated) : 75.3  Temp (24hrs), Avg:98 F (36.7 C), Min:97.6 F (36.4 C), Max:98.4 F (36.9 C)  Recent Labs  Lab 11/13/22 1102 11/14/22 0135 11/15/22 0450 11/16/22 0239 11/17/22 0314 11/17/22 1427  WBC 7.3 10.5  --  8.4 10.0  --   CREATININE 1.82* 1.58* 1.67*  --  2.08*  --   VANCOTROUGH  --   --   --   --   --  11*    Estimated Creatinine Clearance: 33.7 mL/min (A) (by C-G formula based on SCr of 2.08 mg/dL (H)).    Allergies  Allergen Reactions   Meloxicam Rash and Other (See Comments)    Red Man Syndrome, also   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 syndrome 09/02/15, resolved with diphenhydramine and slowing of rate    Antimicrobials this admission: Cefepime 7/17 x1 Metronidazole 7/17 x1 Cefazolin 7/17 >> 7/21  Vancomycin 7/17 >>   Dose adjustments this admission: N/A  Microbiology results: (Prior to admission Wound culture 10/09/22: MSSA, MRSE) 7/19 MRSA PCR: Negative  Thank you for allowing pharmacy to be a part of this  patient's care.  Stephenie Acres, PharmD PGY1 Pharmacy Resident 11/17/2022 4:24 PM   Please refer to [facility] for pharmacy phone number

## 2022-11-17 NOTE — Progress Notes (Addendum)
Pharmacy Antibiotic Note  [name] is a 72 y.o. male admitted on 11/13/2022 with  R BKA stump infection .  Pharmacy has been consulted for Vancomycin dosing.  Patient received one-time doses of cefepime 2g IV, metronidazole 500mg  IV, and vancomycin 1750mg  IV in the ED. Cefazolin stopped yesterday postoperatively. Continues on vancomycin monotherapy.  WBC 10, remains afebrile SCr 2.08 (baseline SCr ~1.4-1.5)  Plan: S/p 3 doses of Vancomycin 1000mg  IV q24h (initial eAUC ~514)    > Goal AUC 400-550, goal trough 10-15 based on indication.    > Increase in Scr today concerning for accumulation of vancomycin. Will obtain trough today at 1400, and dose based on level. Will hold 1500 dose pending level.  Monitor daily CBC, temp, SCr, and for clinical signs of improvement  Will continue to follow antibiotic plan per primary team and orthopedic surgery.  Height: 5\' 11"  (180.3 cm) Weight: 88.9 kg (196 lb) IBW/kg (Calculated) : 75.3  Temp (24hrs), Avg:98 F (36.7 C), Min:97.6 F (36.4 C), Max:98.4 F (36.9 C)  Recent Labs  Lab 11/13/22 1102 11/14/22 0135 11/15/22 0450 11/16/22 0239 11/17/22 0314  WBC 7.3 10.5  --  8.4 10.0  CREATININE 1.82* 1.58* 1.67*  --  2.08*    Estimated Creatinine Clearance: 33.7 mL/min (A) (by C-G formula based on SCr of 2.08 mg/dL (H)).    Allergies  Allergen Reactions   Meloxicam Rash and Other (See Comments)    Red Man Syndrome, also   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 syndrome 09/02/15, resolved with diphenhydramine and slowing of rate    Antimicrobials this admission: Cefepime 7/17 x1 Metronidazole 7/17 x1 Cefazolin 7/17 >> 7/21  Vancomycin 7/17 >>   Dose adjustments this admission: N/A  Microbiology results: (Prior to admission Wound culture 10/09/22: MSSA, MRSE) 7/19 MRSA PCR: Negative  Thank you for allowing pharmacy to be a part of this patient's care.  Lora Paula, PharmD PGY2 Infectious Diseases Pharmacy  Resident 11/17/2022 10:20 AM   Please refer to [facility] for pharmacy phone number

## 2022-11-18 ENCOUNTER — Encounter (HOSPITAL_COMMUNITY): Payer: Self-pay | Admitting: Orthopedic Surgery

## 2022-11-18 DIAGNOSIS — M79604 Pain in right leg: Secondary | ICD-10-CM

## 2022-11-18 LAB — CBC
HCT: 29.3 % — ABNORMAL LOW (ref 39.0–52.0)
Hemoglobin: 9.2 g/dL — ABNORMAL LOW (ref 13.0–17.0)
MCH: 28.8 pg (ref 26.0–34.0)
MCHC: 31.4 g/dL (ref 30.0–36.0)
MCV: 91.8 fL (ref 80.0–100.0)
Platelets: 119 10*3/uL — ABNORMAL LOW (ref 150–400)
RBC: 3.19 MIL/uL — ABNORMAL LOW (ref 4.22–5.81)
RDW: 15 % (ref 11.5–15.5)
WBC: 8.1 10*3/uL (ref 4.0–10.5)
nRBC: 0 % (ref 0.0–0.2)

## 2022-11-18 LAB — BASIC METABOLIC PANEL
Anion gap: 6 (ref 5–15)
BUN: 30 mg/dL — ABNORMAL HIGH (ref 8–23)
CO2: 24 mmol/L (ref 22–32)
Calcium: 8.6 mg/dL — ABNORMAL LOW (ref 8.9–10.3)
Chloride: 103 mmol/L (ref 98–111)
Creatinine, Ser: 1.55 mg/dL — ABNORMAL HIGH (ref 0.61–1.24)
GFR, Estimated: 47 mL/min — ABNORMAL LOW (ref >60)
Glucose, Bld: 98 mg/dL (ref 70–99)
Potassium: 4 mmol/L (ref 3.5–5.1)
Sodium: 133 mmol/L — ABNORMAL LOW (ref 135–145)

## 2022-11-18 LAB — GLUCOSE, CAPILLARY
Glucose-Capillary: 83 mg/dL (ref 70–99)
Glucose-Capillary: 139 mg/dL — ABNORMAL HIGH (ref 70–99)

## 2022-11-18 MED ORDER — DOXYCYCLINE HYCLATE 100 MG PO TABS
100.0000 mg | ORAL_TABLET | Freq: Two times a day (BID) | ORAL | Status: DC
  Administered 2022-11-18: 100 mg via ORAL
  Filled 2022-11-18: qty 1

## 2022-11-18 MED ORDER — DOXYCYCLINE HYCLATE 100 MG PO TABS: 100.0000 mg | ORAL_TABLET | Freq: Two times a day (BID) | ORAL | 0 refills | Status: AC

## 2022-11-18 MED ORDER — SODIUM CHLORIDE 0.9 % IV SOLN: INTRAVENOUS | Status: DC

## 2022-11-18 MED ORDER — OXYCODONE-ACETAMINOPHEN 5-325 MG PO TABS: 1.0000 | ORAL_TABLET | Freq: Four times a day (QID) | ORAL | 0 refills | Status: DC | PRN

## 2022-11-18 NOTE — Care Management Important Message (Signed)
Important Message  Patient Details  Name: [name] MRN: [id number] Date of Birth: [birth year]   Medicare Important Message Given:  Yes     Sherilyn Banker 11/18/2022, 1:13 PM

## 2022-11-18 NOTE — Progress Notes (Signed)
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 PMHx: COPD, afib, fibromyalgia, CHF, HTN, HLD, pacer, back surgery, RLS, 2nd degree AV block, asthma, fatty liver, mitral regurg,    PT Comments  Patient resting in bed at start of session and agreeable to therapy, pt expresses he is pleased to go home today. Supervision for bed mobility and pt able to don lower body clothing and limb protector in supine without assist. Min guard for sit<>stand from EOB with RW, pt with safe technique for hand placement without cues. Pt amb 2x15' with RW and min guard; distance limited by Rt residual limb pain. Pt educated on safety strategies and to don shoe on Lt foot to protect skin when mobilizing with RW. Pt verbalized understanding and called spouse to bring shoe to hospital for discharge. EOS pt agreeable to sit up in recliner. Will continue to progress as able.    Assistance Recommended at Discharge Set up Supervision/Assistance  If plan is discharge home, recommend the following:  Can travel by private vehicle    A little help with walking and/or transfers;A little help with bathing/dressing/bathroom;Assist for transportation;Help with stairs or ramp for entrance;Assistance with cooking/housework      Equipment Recommendations  None recommended by PT    Recommendations for Other Services       Precautions / Restrictions Precautions Precautions: Fall Restrictions Weight Bearing Restrictions: Yes RLE Weight Bearing: Non weight bearing     Mobility  Bed Mobility Overal bed mobility: Needs Assistance Bed Mobility: Supine to Sit     Supine to sit: Supervision     General bed mobility comments: in bed pt completing multiple bridges to  don lower body clothing and able to don limb protector in supine. supervision to come to EOB.    Transfers Overall transfer level: Needs assistance Equipment used: Rolling walker (2 wheels) Transfers: Sit to/from Stand, Bed to chair/wheelchair/BSC Sit to Stand: Supervision           General transfer comment: sup for safety with rise from EOB. sup for rise/lower to recliner with pt demonstrating safe hand placement for power up.    Ambulation/Gait Ambulation/Gait assistance: Min guard Gait Distance (Feet): 15 Feet (2x15) Assistive device: Rolling walker (2 wheels) Gait Pattern/deviations: Decreased stride length (hop through pattern) Gait velocity: decr     General Gait Details: pt maintained safe proximity to RW throughout. pt with safe short hop pattern with Lt LE. educated pt on safety with gait and shoe to protect Lt foot even with short bouts of ambulation. distance limited by Rt residual limb pain in dependent position.   Stairs             Wheelchair Mobility     Tilt Bed    Modified Rankin (Stroke Patients Only)       Balance Overall balance assessment: Needs assistance Sitting-balance support: Bilateral upper extremity supported, Feet supported Sitting balance-Leahy Scale: Fair     Standing balance support: Bilateral upper extremity supported, Reliant on assistive device for balance Standing balance-Leahy Scale: Poor                              Cognition Arousal/Alertness: Awake/alert Behavior During Therapy: WFL for tasks assessed/performed  Overall Cognitive Status: Within Functional Limits for tasks assessed                                          Exercises      General Comments        Pertinent Vitals/Pain Pain Assessment Pain Assessment: Faces Faces Pain Scale: Hurts little more Pain Location: Rt residual limb and Lt thigh Pain Descriptors / Indicators: Aching, Discomfort, Sore Pain Intervention(s):  Limited activity within patient's tolerance, Monitored during session, Repositioned    Home Living                          Prior Function            PT Goals (current goals can now be found in the care plan section) Acute Rehab PT Goals Patient Stated Goal: get back outside PT Goal Formulation: With patient Time For Goal Achievement: 12/01/22 Potential to Achieve Goals: Fair Progress towards PT goals: Progressing toward goals    Frequency    Min 3X/week      PT Plan Current plan remains appropriate    Co-evaluation              AM-PAC PT "6 Clicks" Mobility   Outcome Measure  Help needed turning from your back to your side while in a flat bed without using bedrails?: None Help needed moving from lying on your back to sitting on the side of a flat bed without using bedrails?: None Help needed moving to and from a bed to a chair (including a wheelchair)?: None Help needed standing up from a chair using your arms (e.g., wheelchair or bedside chair)?: None Help needed to walk in hospital room?: A Little Help needed climbing 3-5 steps with a railing? : Total 6 Click Score: 20    End of Session Equipment Utilized During Treatment: Gait belt Activity Tolerance: Patient tolerated treatment well Patient left: in chair;with call bell/phone within reach;with chair alarm set Nurse Communication: Mobility status PT Visit Diagnosis: Unsteadiness on feet (R26.81);Other abnormalities of gait and mobility (R26.89);History of falling (Z91.81);Pain Pain - Right/Left: Right Pain - part of body: Leg     Time: 4259-5638 PT Time Calculation (min) (ACUTE ONLY): 39 min  Charges:    $Gait Training: 8-22 mins $Therapeutic Activity: 23-37 mins PT General Charges $$ ACUTE PT VISIT: 1 Visit                     Wynn Maudlin, DPT Acute Rehabilitation Services Office [phone]  11/18/22 12:13 PM

## 2022-11-18 NOTE — Plan of Care (Signed)
  Problem: Coping: Goal: Ability to adjust to condition or change in health will improve Outcome: Progressing   Problem: Fluid Volume: Goal: Ability to maintain a balanced intake and output will improve Outcome: Progressing   Problem: Health Behavior/Discharge Planning: Goal: Ability to identify and utilize available resources and services will improve Outcome: Progressing   Problem: Metabolic: Goal: Ability to maintain appropriate glucose levels will improve Outcome: Progressing   Problem: Tissue Perfusion: Goal: Adequacy of tissue perfusion will improve Outcome: Progressing

## 2022-11-18 NOTE — Plan of Care (Signed)
Problem: Education: Goal: Ability to describe self-care measures that may prevent or decrease complications (Diabetes Survival Skills Education) will improve 11/18/2022 0801 by Mateo Flow, RN Outcome: Progressing 11/17/2022 1931 by Mateo Flow, RN Outcome: Progressing Goal: Individualized Educational Video(s) 11/18/2022 0801 by Mateo Flow, RN Outcome: Progressing 11/17/2022 1931 by Mateo Flow, RN Outcome: Progressing   Problem: Coping: Goal: Ability to adjust to condition or change in health will improve 11/18/2022 0801 by Mateo Flow, RN Outcome: Progressing 11/17/2022 1931 by Mateo Flow, RN Outcome: Progressing   Problem: Fluid Volume: Goal: Ability to maintain a balanced intake and output will improve 11/18/2022 0801 by Mateo Flow, RN Outcome: Progressing 11/17/2022 1931 by Mateo Flow, RN Outcome: Progressing   Problem: Health Behavior/Discharge Planning: Goal: Ability to identify and utilize available resources and services will improve 11/18/2022 0801 by Mateo Flow, RN Outcome: Progressing 11/17/2022 1931 by Mateo Flow, RN Outcome: Progressing Goal: Ability to manage health-related needs will improve 11/18/2022 0801 by Mateo Flow, RN Outcome: Progressing 11/17/2022 1931 by Mateo Flow, RN Outcome: Progressing   Problem: Metabolic: Goal: Ability to maintain appropriate glucose levels will improve 11/18/2022 0801 by Mateo Flow, RN Outcome: Progressing 11/17/2022 1931 by Mateo Flow, RN Outcome: Progressing   Problem: Nutritional: Goal: Maintenance of adequate nutrition will improve 11/18/2022 0801 by Mateo Flow, RN Outcome: Progressing 11/17/2022 1931 by Mateo Flow, RN Outcome: Progressing Goal: Progress toward achieving an optimal weight will improve 11/18/2022 0801 by Mateo Flow, RN Outcome: Progressing 11/17/2022 1931 by Mateo Flow, RN Outcome: Progressing   Problem:  Skin Integrity: Goal: Risk for impaired skin integrity will decrease 11/18/2022 0801 by Mateo Flow, RN Outcome: Progressing 11/17/2022 1931 by Mateo Flow, RN Outcome: Progressing   Problem: Tissue Perfusion: Goal: Adequacy of tissue perfusion will improve 11/18/2022 0801 by Mateo Flow, RN Outcome: Progressing 11/17/2022 1931 by Mateo Flow, RN Outcome: Progressing   Problem: Education: Goal: Knowledge of General Education information will improve Description: Including pain rating scale, medication(s)/side effects and non-pharmacologic comfort measures 11/18/2022 0801 by Mateo Flow, RN Outcome: Progressing 11/17/2022 1931 by Mateo Flow, RN Outcome: Progressing   Problem: Health Behavior/Discharge Planning: Goal: Ability to manage health-related needs will improve 11/18/2022 0801 by Mateo Flow, RN Outcome: Progressing 11/17/2022 1931 by Mateo Flow, RN Outcome: Progressing   Problem: Clinical Measurements: Goal: Ability to maintain clinical measurements within normal limits will improve 11/18/2022 0801 by Mateo Flow, RN Outcome: Progressing 11/17/2022 1931 by Mateo Flow, RN Outcome: Progressing Goal: Will remain free from infection 11/18/2022 0801 by Mateo Flow, RN Outcome: Progressing 11/17/2022 1931 by Mateo Flow, RN Outcome: Progressing Goal: Diagnostic test results will improve 11/18/2022 0801 by Mateo Flow, RN Outcome: Progressing 11/17/2022 1931 by Mateo Flow, RN Outcome: Progressing Goal: Respiratory complications will improve 11/18/2022 0801 by Mateo Flow, RN Outcome: Progressing 11/17/2022 1931 by Mateo Flow, RN Outcome: Progressing Goal: Cardiovascular complication will be avoided 11/18/2022 0801 by Mateo Flow, RN Outcome: Progressing 11/17/2022 1931 by Mateo Flow, RN Outcome: Progressing   Problem: Activity: Goal: Risk for activity intolerance will  decrease 11/18/2022 0801 by Mateo Flow, RN Outcome: Progressing 11/17/2022 1931 by Mateo Flow, RN Outcome: Progressing   Problem: Nutrition: Goal: Adequate nutrition will be maintained 11/18/2022 0801 by Mateo Flow, RN Outcome: Progressing 11/17/2022 1931 by Mateo Flow, RN Outcome: Progressing  Problem: Coping: Goal: Level of anxiety will decrease 11/18/2022 0801 by Mateo Flow, RN Outcome: Progressing 11/17/2022 1931 by Mateo Flow, RN Outcome: Progressing   Problem: Elimination: Goal: Will not experience complications related to bowel motility 11/18/2022 0801 by Mateo Flow, RN Outcome: Progressing 11/17/2022 1931 by Mateo Flow, RN Outcome: Progressing Goal: Will not experience complications related to urinary retention 11/18/2022 0801 by Mateo Flow, RN Outcome: Progressing 11/17/2022 1931 by Mateo Flow, RN Outcome: Progressing   Problem: Pain Managment: Goal: General experience of comfort will improve 11/18/2022 0801 by Mateo Flow, RN Outcome: Progressing 11/17/2022 1931 by Mateo Flow, RN Outcome: Progressing   Problem: Safety: Goal: Ability to remain free from injury will improve 11/18/2022 0801 by Mateo Flow, RN Outcome: Progressing 11/17/2022 1931 by Mateo Flow, RN Outcome: Progressing   Problem: Skin Integrity: Goal: Risk for impaired skin integrity will decrease 11/18/2022 0801 by Mateo Flow, RN Outcome: Progressing 11/17/2022 1931 by Mateo Flow, RN Outcome: Progressing   Problem: Education: Goal: Knowledge of the prescribed therapeutic regimen will improve 11/18/2022 0801 by Mateo Flow, RN Outcome: Progressing 11/17/2022 1931 by Mateo Flow, RN Outcome: Progressing Goal: Ability to verbalize activity precautions or restrictions will improve 11/18/2022 0801 by Mateo Flow, RN Outcome: Progressing 11/17/2022 1931 by Mateo Flow, RN Outcome:  Progressing Goal: Understanding of discharge needs will improve 11/18/2022 0801 by Mateo Flow, RN Outcome: Progressing 11/17/2022 1931 by Mateo Flow, RN Outcome: Progressing   Problem: Activity: Goal: Ability to perform//tolerate increased activity and mobilize with assistive devices will improve 11/18/2022 0801 by Mateo Flow, RN Outcome: Progressing 11/17/2022 1931 by Mateo Flow, RN Outcome: Progressing   Problem: Clinical Measurements: Goal: Postoperative complications will be avoided or minimized 11/18/2022 0801 by Mateo Flow, RN Outcome: Progressing 11/17/2022 1931 by Mateo Flow, RN Outcome: Progressing   Problem: Self-Care: Goal: Ability to meet self-care needs will improve 11/18/2022 0801 by Mateo Flow, RN Outcome: Progressing 11/17/2022 1931 by Mateo Flow, RN Outcome: Progressing   Problem: Self-Concept: Goal: Ability to maintain and perform role responsibilities to the fullest extent possible will improve 11/18/2022 0801 by Mateo Flow, RN Outcome: Progressing 11/17/2022 1931 by Mateo Flow, RN Outcome: Progressing   Problem: Pain Management: Goal: Pain level will decrease with appropriate interventions 11/18/2022 0801 by Mateo Flow, RN Outcome: Progressing 11/17/2022 1931 by Mateo Flow, RN Outcome: Progressing   Problem: Skin Integrity: Goal: Demonstration of wound healing without infection will improve 11/18/2022 0801 by Mateo Flow, RN Outcome: Progressing 11/17/2022 1931 by Mateo Flow, RN Outcome: Progressing

## 2022-11-18 NOTE — Progress Notes (Signed)
Patient ID: [name], male   DOB: 1950-09-22, 72 y.o.   MRN: [id number] Patient is postoperative day 2 revision transtibial amputation.  There is no drainage in the wound VAC canister but it does not have a good suction fit.  Will discontinue the wound VAC today apply a dry dressing patient may discharge when he is medically stable.  I will follow-up in the office in 1 week.

## 2022-11-18 NOTE — Progress Notes (Signed)
Inpatient Rehab Admissions Coordinator:   Consult for AIR received. PT/OT recommend no follow up upon discharge. Patient does not have AIR needs at this time. Signing off.    Rehab Admissons Coordinator [place], [place], [place] [phone]

## 2022-11-18 NOTE — Discharge Summary (Signed)
[facility]  Physician Discharge Summary   Patient ID: [name] MRN: [id number] DOB/AGE: 05-03-1950 72 y.o.  Admit date: 11/13/2022 Discharge date: 11/18/2022    PCP: Alysia Penna, MD  DISCHARGE DIAGNOSES:    Dehiscence of amputation stump of right lower extremity (HCC)   Acute on chronic diastolic (congestive) heart failure (HCC)   Hx of BKA, right (HCC)   Diabetic ulcer of lower leg (HCC)   Fall   Pain of right lower extremity due to injury   RECOMMENDATIONS FOR OUTPATIENT FOLLOW UP: Follow-up with Dr. Lajoyce Corners in 1 week   Home Health: None Equipment/Devices: None  CODE STATUS: DNR  DISCHARGE CONDITION: fair  Diet recommendation: As before  INITIAL HISTORY: 72 y.o. male with medical history significant of chronic A-fib on Eliquis, chronic HFpEF, IIDM, CKD stage II, HTN, PPM, COPD, HLD, hypothyroidism, diabetic osteomyelitis status post right BKA with chronic poor healing right leg stump wound, presented with fall and dehiscence of the right BKA wound.  Patient was hospitalized for further management.  Orthopedics was consulted.   Consultants: Orthopedics   Procedures: Revision of the right below-knee amputation on 7/20   HOSPITAL COURSE:   Right BKA Stump with wound dehiscence Patient with history of recurrent right BKA ulcer and infection.  Most recent wound culture from June showed MRSA and MSSA.   Underwent right BKA revision surgery on 7/20. Was on vancomycin here in the hospital.  Will be discharged on doxycycline.   Acute on chronic diastolic CHF Thought to be in fluid overload at admission.  Was given furosemide with good diuresis.   Seems to be stable without evidence for volume overload currently.  Chest x-ray shows only vascular congestion without any clear pulmonary edema.   Last echocardiogram is from February 2023 which showed normal LVEF. He may resume his home diuretic regimen.   Acute kidney injury on chronic kidney disease stage  II Baseline renal function seems to be around 1.5.  Came in with creatinine of 1.82.  Improved to 1.6.  Noted to be higher on 7/21 at 2.08.  Likely due to n.p.o. status the day prior for his surgery.  He was given IV fluids with improvement in renal function this morning.    Normocytic anemia/acute blood loss anemia Drop in hemoglobin is noted.  Likely due to surgical loss.  No other overt bleeding noted.     Paroxysmal atrial fibrillation Continue with bisoprolol.  May resume Eliquis.   Essential hypertension Noted to be on amlodipine.  Blood pressure is reasonably well-controlled.   Hypothyroidism Continue with levothyroxine.   Diabetes mellitus type 2 Monitor CBGs.   History of COPD on chronic steroids Stable.  Noted to be on steroids on a chronic basis which is being continued.  No evidence of exacerbation.  Continue with his inhalers.   Suspected foreign body in the right thumb This was incidentally noted on x-ray of the hand.  Nothing concerning noted on physical examination.  Patient is stable.  Okay for discharge home today.  Discussed with Dr. Lajoyce Corners who is cleared him for discharge.  No need for wound VAC.  PERTINENT LABS:  The results of significant diagnostics from this hospitalization (including imaging, microbiology, ancillary and laboratory) are listed below for reference.    Microbiology: Recent Results (from the past 240 hour(s))  Surgical pcr screen     Status: None   Collection Time: 11/15/22  5:56 AM   Specimen: Nasal Mucosa; Nasal Swab  Result Value Ref Range Status  MRSA, PCR NEGATIVE NEGATIVE Final   Staphylococcus aureus NEGATIVE NEGATIVE Final    Comment: (NOTE) The Xpert SA Assay (FDA approved for NASAL specimens in patients 56 years of age and older), is one component of a comprehensive surveillance program. It is not intended to diagnose infection nor to guide or monitor treatment. Performed at [facility], [address],  [place], [place] [address]      Labs:   Basic Metabolic Panel: Recent Labs  Lab 11/13/22 1102 11/14/22 0135 11/15/22 0450 11/17/22 0314 11/18/22 0856  NA 133* 132* 131* 130* 133*  K 4.4 4.6 4.5 4.7 4.0  CL 93* 93* 92* 97* 103  CO2 26 25 26 25 24   GLUCOSE 88 102* 86 119* 98  BUN 21 18 19  36* 30*  CREATININE 1.82* 1.58* 1.67* 2.08* 1.55*  CALCIUM 9.1 9.2 9.2 8.9 8.6*   Liver Function Tests: Recent Labs  Lab 11/13/22 1102  AST 34  ALT 25  ALKPHOS 52  BILITOT 0.2*  PROT 7.1  ALBUMIN 3.9    CBC: Recent Labs  Lab 11/13/22 1102 11/14/22 0135 11/16/22 0239 11/17/22 0314 11/18/22 0856  WBC 7.3 10.5 8.4 10.0 8.1  NEUTROABS  --   --  6.1  --   --   HGB 11.8* 13.3 11.9* 9.9* 9.2*  HCT 38.1* 42.4 38.3* 30.5* 29.3*  MCV 92.0 91.8 89.3 88.9 91.8  PLT 156 185 160 159 119*     CBG: Recent Labs  Lab 11/17/22 1135 11/17/22 1545 11/17/22 2138 11/18/22 0754 11/18/22 1116  GLUCAP 112* 111* 141* 83 139*     IMAGING STUDIES DG Chest 1 View  Result Date: 11/14/2022 CLINICAL DATA:  CHF. EXAM: CHEST  1 VIEW COMPARISON:  11/13/2022 FINDINGS: The cardiopericardial silhouette is within normal limits for size. Mild vascular congestion without overt airspace pulmonary edema. No pleural effusion or focal airspace consolidation. Left-sided permanent pacemaker again noted. No acute bony abnormality. IMPRESSION: Mild vascular congestion without overt airspace pulmonary edema. Electronically Signed   By: Kennith Center M.D.   On: 11/14/2022 06:07   CT Cervical Spine Wo Contrast  Result Date: 11/13/2022 CLINICAL DATA:  Trauma, pain EXAM: CT CERVICAL SPINE WITHOUT CONTRAST TECHNIQUE: Multidetector CT imaging of the cervical spine was performed without intravenous contrast. Multiplanar CT image reconstructions were also generated. RADIATION DOSE REDUCTION: This exam was performed according to the departmental dose-optimization program which includes automated exposure control, adjustment of  the mA and/or kV according to patient size and/or use of iterative reconstruction technique. COMPARISON:  05/13/2022 FINDINGS: Alignment: Alignment of posterior margins of vertebral bodies is within normal limits. There is minimal dextroscoliosis. Skull base and vertebrae: No recent fracture is seen. Degenerative changes are noted, more so at C4-C5 and C5-C6 levels. Soft tissues and spinal canal: There is no central spinal stenosis. Disc levels: There is moderate encroachment of left neural foramen at C2-C3 level. There is mild to moderate encroachment of left neural foramen at C3-C4 level. There is moderate to marked encroachment of neural foramina at C4-C5 and C5-C6 levels. Upper chest: Unremarkable. Other: Thyroid is difficult to visualize. IMPRESSION: No recent fracture is seen in cervical spine. Cervical spondylosis with encroachment of neural foramina at multiple levels as described in the body of the report. Electronically Signed   By: Ernie Avena M.D.   On: 11/13/2022 12:36   CT Head Wo Contrast  Result Date: 11/13/2022 CLINICAL DATA:  Trauma, fall EXAM: CT HEAD WITHOUT CONTRAST TECHNIQUE: Contiguous axial images were obtained  from the base of the skull through the vertex without intravenous contrast. RADIATION DOSE REDUCTION: This exam was performed according to the departmental dose-optimization program which includes automated exposure control, adjustment of the mA and/or kV according to patient size and/or use of iterative reconstruction technique. COMPARISON:  10/17/2022 FINDINGS: Brain: No acute intracranial findings are seen. There are no signs of bleeding within the cranium. Cortical sulci are prominent. Vascular: Unremarkable. Skull: No fracture is seen in calvarium. Sinuses/Orbits: There is a mucous retention cyst in right maxillary sinus. There is mild medial bulging of inferior aspect of medial wall of left orbit, possibly residual change from remote injury. Other: None. IMPRESSION:  No acute intracranial findings are seen in noncontrast CT brain. Atrophy. Electronically Signed   By: Ernie Avena M.D.   On: 11/13/2022 12:30   DG Knee 2 Views Right  Result Date: 11/13/2022 CLINICAL DATA:  Trauma, pain EXAM: RIGHT KNEE - 1-2 VIEW COMPARISON:  10/17/2022 FINDINGS: The previous below-knee amputation in right lower leg. No recent fracture or dislocation is seen. No focal erosive changes are noted. Minimal bony spurs are seen in better alignment. There is no significant effusion. Arterial calcifications are seen in soft tissues. IMPRESSION: Previous below-knee amputation in right lower leg. No recent fracture or dislocation is seen in right knee. Arteriosclerosis. Electronically Signed   By: Ernie Avena M.D.   On: 11/13/2022 12:27   DG Hand Complete Right  Result Date: 11/13/2022 CLINICAL DATA:  Trauma, fall EXAM: RIGHT HAND - COMPLETE 3+ VIEW COMPARISON:  None Available. FINDINGS: Monitoring devices is partly obscuring the middle and distal phalanges of index finger. No recent fracture or dislocation is seen. Degenerative changes are noted with bony spurs and multiple interphalangeal joints and metacarpophalangeal joints. Bony spurs are seen in first carpometacarpal joint. Deformity in the distal ulna may be residual from previous injury. Degenerative changes are noted in the inferior radioulnar joint. There is a thin linear radiopacity in the soft tissues in the tip of right thumb. IMPRESSION: No fracture or dislocation is seen. Degenerative changes are noted in multiple joints. There is small thin linear radiopacity in the soft tissues along the palmar aspect of tip of distal phalanx right thumb suggesting foreign body in the soft tissues. Electronically Signed   By: Ernie Avena M.D.   On: 11/13/2022 12:25   DG Pelvis Portable  Result Date: 11/13/2022 CLINICAL DATA:  Trauma, fall EXAM: PORTABLE PELVIS 1-2 VIEWS COMPARISON:  None Available. FINDINGS: There is no  evidence of displaced pelvic fracture or diastasis. No pelvic bone lesions are seen. Nonobstructive pattern of overlying bowel gas. Large burden of stool. IMPRESSION: No displaced fracture of the pelvis or bilateral proximal femurs in single frontal view. Electronically Signed   By: Jearld Lesch M.D.   On: 11/13/2022 12:15   DG Chest Port 1 View  Result Date: 11/13/2022 CLINICAL DATA:  trauma EXAM: PORTABLE CHEST 1 VIEW COMPARISON:  CXR 10/17/22 FINDINGS: Left-sided dual lead cardiac device in place with unchanged lead positioning. Status post median sternotomy and aortic valve repair. Unchanged cardiac and mediastinal contours. No pleural effusion. No pneumothorax. No radiographically apparent displaced rib fractures. Visualized upper abdomen is unremarkable. Prominent bilateral interstitial opacities that could represent pulmonary venous congestion. IMPRESSION: Prominent bilateral interstitial opacities that could represent pulmonary venous congestion. Electronically Signed   By: Lorenza Cambridge M.D.   On: 11/13/2022 12:08   CUP PACEART REMOTE DEVICE CHECK  Result Date: 11/07/2022 Scheduled remote reviewed. Normal device function.  Known permanent  AF, on OAC according to previous reports, programmed VVIR, good ventricular rate control Next remote 91 days. Hassell Halim, RN, CCDS, [employer]   DISCHARGE EXAMINATION: Vitals:   11/18/22 0436 11/18/22 0753 11/18/22 0759 11/18/22 1246  BP: (!) 117/58 123/63  132/60  Pulse: 60 60 60 66  Resp: 16 17 17 18   Temp:  98.6 F (37 C)  98 F (36.7 C)  TempSrc:      SpO2: 97% 97% 96% 94%  Weight:      Height:       General appearance: Awake alert.  In no distress Resp: Clear to auscultation bilaterally.  Normal effort Cardio: S1-S2 is normal regular.  No S3-S4.  No rubs murmurs or bruit GI: Abdomen is soft.  Nontender nondistended.  Bowel sounds are present normal.  No masses organomegaly   DISPOSITION: Home  Discharge Instructions      Call MD for:  difficulty breathing, headache or visual disturbances   Complete by: As directed    Call MD for:  extreme fatigue   Complete by: As directed    Call MD for:  persistant dizziness or light-headedness   Complete by: As directed    Call MD for:  persistant nausea and vomiting   Complete by: As directed    Call MD for:  redness, tenderness, or signs of infection (pain, swelling, redness, odor or green/yellow discharge around incision site)   Complete by: As directed    Call MD for:  severe uncontrolled pain   Complete by: As directed    Call MD for:  temperature >100.4   Complete by: As directed    Diet - low sodium heart healthy   Complete by: As directed    Discharge instructions   Complete by: As directed    Please be sure to follow-up with Dr. Audrie Lia office in 1 week.  Take your medications as prescribed.  You were cared for by a hospitalist during your hospital stay. If you have any questions about your discharge medications or the care you received while you were in the hospital after you are discharged, you can call the unit and asked to speak with the hospitalist on call if the hospitalist that took care of you is not available. Once you are discharged, your primary care physician will handle any further medical issues. Please note that NO REFILLS for any discharge medications will be authorized once you are discharged, as it is imperative that you return to your primary care physician (or establish a relationship with a primary care physician if you do not have one) for your aftercare needs so that they can reassess your need for medications and monitor your lab values. If you do not have a primary care physician, you can call [phone] for a physician referral.   Discharge wound care:   Complete by: As directed    As per Dr. Audrie Lia instructions   Increase activity slowly   Complete by: As directed          Allergies as of 11/18/2022       Reactions   Meloxicam  Rash, Other (See Comments)   Red Man Syndrome, also   Vancomycin Other (See Comments)   Red Man's syndrome 09/02/15, resolved with diphenhydramine and slowing of rate        Medication List     TAKE these medications    albuterol 108 (90 Base) MCG/ACT inhaler Commonly known as: VENTOLIN HFA Inhale 2 puffs into the lungs  every 6 (six) hours as needed for wheezing or shortness of breath.   amLODipine 5 MG tablet Commonly known as: NORVASC Take 5 mg by mouth in the morning.   apixaban 5 MG Tabs tablet Commonly known as: ELIQUIS Take 1 tablet (5 mg total) by mouth 2 (two) times daily.   bisoprolol 5 MG tablet Commonly known as: ZEBETA Take 5 mg by mouth daily.   Breztri Aerosphere 160-9-4.8 MCG/ACT Aero Generic drug: Budeson-Glycopyrrol-Formoterol Inhale 2 puffs into the lungs in the morning and at bedtime.   Cal-Mag-Zinc-D Tabs Take 1 tablet by mouth daily.   doxycycline 100 MG tablet Commonly known as: VIBRA-TABS Take 1 tablet (100 mg total) by mouth 2 (two) times daily for 7 days.   empagliflozin 10 MG Tabs tablet Commonly known as: Jardiance Take 1 tablet (10 mg total) by mouth daily.   gabapentin 300 MG capsule Commonly known as: NEURONTIN Take 2 capsules (600 mg total) by mouth 3 (three) times daily. for neuropathy   hydrOXYzine 10 MG tablet Commonly known as: ATARAX Take 1 tablet (10 mg total) by mouth 3 (three) times daily as needed. What changed: reasons to take this   levothyroxine 175 MCG tablet Commonly known as: SYNTHROID Take 175 mcg by mouth daily before breakfast.   montelukast 10 MG tablet Commonly known as: SINGULAIR TAKE 1 TABLET(10 MG) BY MOUTH AT BEDTIME What changed: See the new instructions.   Mucinex DM Maximum Strength 60-1200 MG Tb12 Take 1 tablet by mouth daily.   multivitamin with minerals Tabs tablet Take 1 tablet by mouth daily.   ondansetron 4 MG tablet Commonly known as: Zofran Take 1 tablet (4 mg total) by mouth every 8  (eight) hours as needed for nausea or vomiting.   oxyCODONE-acetaminophen 5-325 MG tablet Commonly known as: PERCOCET/ROXICET Take 1 tablet by mouth every 6 (six) hours as needed for moderate pain.   OXYGEN Inhale 2 L/min into the lungs See admin instructions. 2 L/min at bedtime and 2 L/min throughout the day as needed for shortness of breath   pantoprazole 40 MG tablet Commonly known as: PROTONIX Take 40 mg by mouth daily before breakfast.   polyethylene glycol powder 17 GM/SCOOP powder Commonly known as: GLYCOLAX/MIRALAX Take 17 g by mouth daily as needed for mild constipation. What changed: when to take this   potassium chloride SA 20 MEQ tablet Commonly known as: KLOR-CON M Take 20 mEq by mouth See admin instructions. Take 20 meq twice daily every other day   predniSONE 5 MG tablet Commonly known as: DELTASONE Take 0.5 tablets (2.5 mg total) by mouth daily with breakfast.   rOPINIRole 1 MG tablet Commonly known as: REQUIP Take 1 tablet (1 mg total) by mouth in the morning and at bedtime.   rosuvastatin 10 MG tablet Commonly known as: CRESTOR Take 10 mg by mouth at bedtime.   senna-docusate 8.6-50 MG tablet Commonly known as: Senokot-S Take 1 tablet by mouth 2 (two) times daily.   spironolactone 25 MG tablet Commonly known as: ALDACTONE Take 1 tablet (25 mg total) by mouth daily.   sucralfate 1 g tablet Commonly known as: CARAFATE Take 1 g by mouth 3 (three) times daily as needed (Acid reflux).   Systane Ultra PF 0.4-0.3 % Soln Generic drug: Polyethyl Glyc-Propyl Glyc PF Place 1 drop into both eyes 2 (two) times daily as needed (dry eyes).   TIGER BALM ARTHRITIS RUB EX Apply 1 application topically as needed (for leg pain).   torsemide 10 MG tablet  Commonly known as: DEMADEX Take 15 mg by mouth every other day.   traZODone 50 MG tablet Commonly known as: DESYREL Take 50 mg by mouth at bedtime.   Vitamin D 50 MCG (2000 UT) tablet Take 8,000 Units by  mouth daily.               Discharge Care Instructions  (From admission, onward)           Start     Ordered   11/18/22 0000  Discharge wound care:       Comments: As per Dr. Audrie Lia instructions   11/18/22 1057              Follow-up Information     [name], MD Follow up in 1 week(s).   Specialty: Orthopedic Surgery Contact information: [address] [place] [address] [phone]                 TOTAL DISCHARGE TIME: 35 minutes  Sya Nestler Rito Ehrlich  [facility] Pager on [email or web address]  11/18/2022, 5:29 PM

## 2022-11-18 NOTE — Care Plan (Signed)
Patient discharged home to self/ family care. Patient in stable condition upon discharge. Discharge instructions including medication regimen and follow-up appointments explained to patient. PIV to L AC removed. Wound vac removed and dry dressing placed as per order. RN Clinical research associate took patient down to family car. All personal belongings taken with patient.

## 2022-11-18 NOTE — Plan of Care (Signed)
Problem: Education: Goal: Ability to describe self-care measures that may prevent or decrease complications (Diabetes Survival Skills Education) will improve 11/18/2022 1243 by Mateo Flow, RN Outcome: Adequate for Discharge 11/18/2022 0801 by Mateo Flow, RN Outcome: Progressing Goal: Individualized Educational Video(s) 11/18/2022 1243 by Mateo Flow, RN Outcome: Adequate for Discharge 11/18/2022 0801 by Mateo Flow, RN Outcome: Progressing   Problem: Coping: Goal: Ability to adjust to condition or change in health will improve 11/18/2022 1243 by Mateo Flow, RN Outcome: Adequate for Discharge 11/18/2022 0801 by Mateo Flow, RN Outcome: Progressing   Problem: Fluid Volume: Goal: Ability to maintain a balanced intake and output will improve 11/18/2022 1243 by Mateo Flow, RN Outcome: Adequate for Discharge 11/18/2022 0801 by Mateo Flow, RN Outcome: Progressing   Problem: Health Behavior/Discharge Planning: Goal: Ability to identify and utilize available resources and services will improve 11/18/2022 1243 by Mateo Flow, RN Outcome: Adequate for Discharge 11/18/2022 0801 by Mateo Flow, RN Outcome: Progressing Goal: Ability to manage health-related needs will improve 11/18/2022 1243 by Mateo Flow, RN Outcome: Adequate for Discharge 11/18/2022 0801 by Mateo Flow, RN Outcome: Progressing   Problem: Metabolic: Goal: Ability to maintain appropriate glucose levels will improve 11/18/2022 1243 by Mateo Flow, RN Outcome: Adequate for Discharge 11/18/2022 0801 by Mateo Flow, RN Outcome: Progressing   Problem: Nutritional: Goal: Maintenance of adequate nutrition will improve 11/18/2022 1243 by Mateo Flow, RN Outcome: Adequate for Discharge 11/18/2022 0801 by Mateo Flow, RN Outcome: Progressing Goal: Progress toward achieving an optimal weight will improve 11/18/2022 1243 by Mateo Flow, RN Outcome:  Adequate for Discharge 11/18/2022 0801 by Mateo Flow, RN Outcome: Progressing   Problem: Skin Integrity: Goal: Risk for impaired skin integrity will decrease 11/18/2022 1243 by Mateo Flow, RN Outcome: Adequate for Discharge 11/18/2022 0801 by Mateo Flow, RN Outcome: Progressing   Problem: Tissue Perfusion: Goal: Adequacy of tissue perfusion will improve 11/18/2022 1243 by Mateo Flow, RN Outcome: Adequate for Discharge 11/18/2022 0801 by Mateo Flow, RN Outcome: Progressing   Problem: Education: Goal: Knowledge of General Education information will improve Description: Including pain rating scale, medication(s)/side effects and non-pharmacologic comfort measures 11/18/2022 1243 by Mateo Flow, RN Outcome: Adequate for Discharge 11/18/2022 0801 by Mateo Flow, RN Outcome: Progressing   Problem: Health Behavior/Discharge Planning: Goal: Ability to manage health-related needs will improve 11/18/2022 1243 by Mateo Flow, RN Outcome: Adequate for Discharge 11/18/2022 0801 by Mateo Flow, RN Outcome: Progressing   Problem: Clinical Measurements: Goal: Ability to maintain clinical measurements within normal limits will improve 11/18/2022 1243 by Mateo Flow, RN Outcome: Adequate for Discharge 11/18/2022 0801 by Mateo Flow, RN Outcome: Progressing Goal: Will remain free from infection 11/18/2022 1243 by Mateo Flow, RN Outcome: Adequate for Discharge 11/18/2022 0801 by Mateo Flow, RN Outcome: Progressing Goal: Diagnostic test results will improve 11/18/2022 1243 by Mateo Flow, RN Outcome: Adequate for Discharge 11/18/2022 0801 by Mateo Flow, RN Outcome: Progressing Goal: Respiratory complications will improve 11/18/2022 1243 by Mateo Flow, RN Outcome: Adequate for Discharge 11/18/2022 0801 by Mateo Flow, RN Outcome: Progressing Goal: Cardiovascular complication will be avoided 11/18/2022 1243 by  Mateo Flow, RN Outcome: Adequate for Discharge 11/18/2022 0801 by Mateo Flow, RN Outcome: Progressing   Problem: Activity: Goal: Risk for activity intolerance will decrease 11/18/2022 1243 by Mateo Flow, RN Outcome: Adequate for Discharge  11/18/2022 0801 by Mateo Flow, RN Outcome: Progressing   Problem: Nutrition: Goal: Adequate nutrition will be maintained 11/18/2022 1243 by Mateo Flow, RN Outcome: Adequate for Discharge 11/18/2022 0801 by Mateo Flow, RN Outcome: Progressing   Problem: Coping: Goal: Level of anxiety will decrease 11/18/2022 1243 by Mateo Flow, RN Outcome: Adequate for Discharge 11/18/2022 0801 by Mateo Flow, RN Outcome: Progressing   Problem: Elimination: Goal: Will not experience complications related to bowel motility 11/18/2022 1243 by Mateo Flow, RN Outcome: Adequate for Discharge 11/18/2022 0801 by Mateo Flow, RN Outcome: Progressing Goal: Will not experience complications related to urinary retention 11/18/2022 1243 by Mateo Flow, RN Outcome: Adequate for Discharge 11/18/2022 0801 by Mateo Flow, RN Outcome: Progressing   Problem: Pain Managment: Goal: General experience of comfort will improve 11/18/2022 1243 by Mateo Flow, RN Outcome: Adequate for Discharge 11/18/2022 0801 by Mateo Flow, RN Outcome: Progressing   Problem: Safety: Goal: Ability to remain free from injury will improve 11/18/2022 1243 by Mateo Flow, RN Outcome: Adequate for Discharge 11/18/2022 0801 by Mateo Flow, RN Outcome: Progressing   Problem: Skin Integrity: Goal: Risk for impaired skin integrity will decrease 11/18/2022 1243 by Mateo Flow, RN Outcome: Adequate for Discharge 11/18/2022 0801 by Mateo Flow, RN Outcome: Progressing   Problem: Education: Goal: Knowledge of the prescribed therapeutic regimen will improve 11/18/2022 1243 by Mateo Flow, RN Outcome: Adequate  for Discharge 11/18/2022 0801 by Mateo Flow, RN Outcome: Progressing Goal: Ability to verbalize activity precautions or restrictions will improve 11/18/2022 1243 by Mateo Flow, RN Outcome: Adequate for Discharge 11/18/2022 0801 by Mateo Flow, RN Outcome: Progressing Goal: Understanding of discharge needs will improve 11/18/2022 1243 by Mateo Flow, RN Outcome: Adequate for Discharge 11/18/2022 0801 by Mateo Flow, RN Outcome: Progressing   Problem: Activity: Goal: Ability to perform//tolerate increased activity and mobilize with assistive devices will improve 11/18/2022 1243 by Mateo Flow, RN Outcome: Adequate for Discharge 11/18/2022 0801 by Mateo Flow, RN Outcome: Progressing   Problem: Clinical Measurements: Goal: Postoperative complications will be avoided or minimized 11/18/2022 1243 by Mateo Flow, RN Outcome: Adequate for Discharge 11/18/2022 0801 by Mateo Flow, RN Outcome: Progressing   Problem: Self-Care: Goal: Ability to meet self-care needs will improve 11/18/2022 1243 by Mateo Flow, RN Outcome: Adequate for Discharge 11/18/2022 0801 by Mateo Flow, RN Outcome: Progressing   Problem: Self-Concept: Goal: Ability to maintain and perform role responsibilities to the fullest extent possible will improve 11/18/2022 1243 by Mateo Flow, RN Outcome: Adequate for Discharge 11/18/2022 0801 by Mateo Flow, RN Outcome: Progressing   Problem: Pain Management: Goal: Pain level will decrease with appropriate interventions 11/18/2022 1243 by Mateo Flow, RN Outcome: Adequate for Discharge 11/18/2022 0801 by Mateo Flow, RN Outcome: Progressing   Problem: Skin Integrity: Goal: Demonstration of wound healing without infection will improve 11/18/2022 1243 by Mateo Flow, RN Outcome: Adequate for Discharge 11/18/2022 0801 by Mateo Flow, RN Outcome: Progressing

## 2022-11-21 ENCOUNTER — Ambulatory Visit: Payer: Medicare Other | Admitting: Primary Care

## 2022-11-26 ENCOUNTER — Encounter: Payer: Medicare Other | Admitting: Orthopedic Surgery

## 2022-11-26 NOTE — Progress Notes (Signed)
Remote pacemaker transmission.   

## 2022-11-29 ENCOUNTER — Other Ambulatory Visit: Payer: Self-pay | Admitting: Emergency Medicine

## 2022-11-29 ENCOUNTER — Other Ambulatory Visit (HOSPITAL_COMMUNITY): Payer: Self-pay

## 2022-11-29 MED ORDER — BISOPROLOL FUMARATE 5 MG PO TABS: 5.0000 mg | ORAL_TABLET | Freq: Every day | ORAL | 3 refills | Status: DC

## 2022-12-02 ENCOUNTER — Encounter: Payer: Self-pay | Admitting: Orthopedic Surgery

## 2022-12-02 ENCOUNTER — Ambulatory Visit (INDEPENDENT_AMBULATORY_CARE_PROVIDER_SITE_OTHER): Payer: Medicare Other | Admitting: Orthopedic Surgery

## 2022-12-02 DIAGNOSIS — T8781 Dehiscence of amputation stump: Secondary | ICD-10-CM

## 2022-12-02 DIAGNOSIS — Z89511 Acquired absence of right leg below knee: Secondary | ICD-10-CM

## 2022-12-02 NOTE — Progress Notes (Signed)
Office Visit Note   Patient: [name]           Date of Birth: Jun 21, 1950           MRN: [id number] Visit Date: 12/02/2022              Requested by: Alysia Penna, MD [address] [place],  [place] [address] PCP: Alysia Penna, MD  Chief Complaint  Patient presents with   Right Leg - Routine Post Op    10/09/2022 revision right BKA  11/16/2022 revision right BKA       HPI: Patient is 2-week status post revision right below-knee amputation.  Patient states he has a little bit of drainage also had a recent fall with trauma to the left elbow has increased bursal swelling.  Assessment & Plan: Visit Diagnoses:  1. S/P BKA (below knee amputation), right (HCC)   2. Dehiscence of amputation stump of right lower extremity (HCC)     Plan: Observation for the olecranon bursal swelling on the left.  Dial soap cleansing to the right leg with dry dressing changes.  Follow-Up Instructions: Return in about 2 weeks (around 12/16/2022).   Ortho Exam  Patient is alert, oriented, no adenopathy, well-dressed, normal affect, normal respiratory effort. Examination the incision is healing well there is no wound dehiscence no cellulitis there is some clear serosanguineous drainage.  Imaging: No results found. No images are attached to the encounter.  Labs: Lab Results  Component Value Date   HGBA1C 6.3 (H) 11/13/2022   HGBA1C 6.5 (H) 12/27/2021   HGBA1C 6.6 (H) 06/11/2021   ESRSEDRATE 60 (H) 09/04/2015   REPTSTATUS 10/22/2022 FINAL 10/17/2022   GRAMSTAIN NO WBC SEEN NO ORGANISMS SEEN  10/09/2022   CULT  10/17/2022    NO GROWTH 5 DAYS Performed at [facility], [address], [place], [place] [address]    [facility] STAPHYLOCOCCUS AUREUS 10/09/2022   LABORGA STAPHYLOCOCCUS EPIDERMIDIS 10/09/2022     Lab Results  Component Value Date   ALBUMIN 3.9 11/13/2022   ALBUMIN 3.3 (L) 10/17/2022   ALBUMIN 3.6 09/04/2022   PREALBUMIN 25 09/04/2022    Lab Results   Component Value Date   MG 2.0 06/11/2021   MG 2.3 12/10/2020   MG 2.4 05/29/2020   No results found for: "VD25OH"  Lab Results  Component Value Date   PREALBUMIN 25 09/04/2022      Latest Ref Rng & Units 11/18/2022    8:56 AM 11/17/2022    3:14 AM 11/16/2022    2:39 AM  CBC EXTENDED  WBC 4.0 - 10.5 K/uL 8.1  10.0  8.4   RBC 4.22 - 5.81 MIL/uL 3.19  3.43  4.29   Hemoglobin 13.0 - 17.0 g/dL 9.2  9.9  82.9   HCT 56.2 - 52.0 % 29.3  30.5  38.3   Platelets 150 - 400 K/uL 119  159  160   NEUT# 1.7 - 7.7 K/uL   6.1   Lymph# 0.7 - 4.0 K/uL   1.3      There is no height or weight on file to calculate BMI.  Orders:  No orders of the defined types were placed in this encounter.  No orders of the defined types were placed in this encounter.    Procedures: No procedures performed  Clinical Data: No additional findings.  ROS:  All other systems negative, except as noted in the HPI. Review of Systems  Objective: Vital Signs: There were no vitals taken for  this visit.  Specialty Comments:  No specialty comments available.  PMFS History: Patient Active Problem List   Diagnosis Date Noted   Fall 11/14/2022   Pain of right lower extremity due to injury 11/14/2022   Diabetic ulcer of lower leg (HCC) 11/13/2022   Osteomyelitis (HCC) 11/13/2022   Dyslipidemia 10/18/2022   Cellulitis of right lower extremity 10/17/2022   Dehiscence of amputation stump of right lower extremity (HCC) 10/09/2022   Below-knee amputation of right lower extremity (HCC) 10/09/2022   Gangrene of right foot (HCC) 09/04/2022   Hx of BKA, right (HCC) 09/04/2022   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) 08/23/2022   CAP (community acquired pneumonia) 12/28/2021   Constipation 12/28/2021   Pain in both lower extremities 07/03/2021   Peripheral neuropathy 07/03/2021   Acute metabolic encephalopathy 06/09/2021   Marijuana dependence (HCC) 06/09/2021   DNR (do not resuscitate) 06/09/2021    Chest pain 06/08/2021   Renal lesion 02/14/2021   Status post cryoablation 02/14/2021   Allergic rhinitis 12/08/2020   Preop pulmonary/respiratory exam 03/27/2020   Acute on chronic diastolic (congestive) heart failure (HCC) 01/22/2020   Abdominal pain 01/22/2020   Hypothyroidism 12/13/2019   DM2 (diabetes mellitus, type 2) (HCC) 12/13/2019   Chronic kidney disease, stage 3b (HCC) 12/13/2019   Acute bacterial bronchitis 12/13/2019   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   Typical atrial flutter (HCC)    Chronic respiratory failure with hypoxia (HCC) 05/23/2016   CAD (coronary artery disease) 05/23/2016   Persistent atrial fibrillation (HCC)    Atypical atrial flutter (HCC)    Visit for monitoring Tikosyn therapy 04/16/2016   Chronic diastolic CHF (congestive heart failure) (HCC) 02/03/2016   Cough 12/06/2015   Acute on chronic respiratory failure with hypoxia (HCC) 09/08/2015   Hallucinations 09/04/2015   Lymphadenopathy 09/04/2015   Ascending aortic aneurysm (HCC) 09/04/2015   Hypoxia 09/02/2015   S/P aortic valve replacement with bioprosthetic valve 09/02/2015   Pleural effusion 09/02/2015   S/P AVR 08/22/2015   GERD without esophagitis 09/20/2014   Intrinsic asthma 10/13/2013   Dyspnea 09/15/2013   Pulmonary hypertension (HCC) 09/15/2013   Shortness of breath 05/05/2013   Pacemaker-St.Jude 04/01/2012   Bradycardia 03/31/2012   Second degree AV block 03/31/2012   AV block, 1st degree 02/01/2012   PAF (paroxysmal atrial fibrillation) (HCC) 11/13/2011   Fatigue 11/13/2011   Essential hypertension 11/13/2011   Aortic stenosis 11/13/2011   Sleep apnea 11/13/2011   Past Medical History:  Diagnosis Date   Aortic stenosis, mild    Arthritis of hand    "just a little bit in both hands" (03/31/2012)   Asthma    "little bit" (03/31/2012)   Atrial fibrillation (HCC)    dx '04; DCCV '04, placed on flecainide, failed DCCV 04/2010, flecainide stopped; s/p successful A.fib ablation  01/31/12   CHF (congestive heart failure) (HCC)    Controlled type 2 diabetes mellitus without complication, without long-term current use of insulin (HCC) 12/13/2019   COPD (chronic obstructive pulmonary disease) (HCC)    DJD (degenerative joint disease)    Fatty liver    Fibromyalgia    GERD (gastroesophageal reflux disease)    Hyperlipidemia    Hypertension    Hypothyroidism    S/P radiation   Left atrial enlargement    LA size 45mm by echo 11/21/11   Mitral regurgitation    trivial   Obstructive sleep apnea    mild by sleep study 2013; pt stated he does not have a machine  because "it wasn't bad enough for him to have one"   Pacemaker 03/31/2012   Pneumonia    last time 2023   Restless leg syndrome    Second degree AV block    Splenomegaly     Family History  Problem Relation Age of Onset   Heart failure Mother    Stroke Father    Neuropathy Neg Hx     Past Surgical History:  Procedure Laterality Date   AMPUTATION Right 08/23/2022   Procedure: RIGHT FOOT FIFTH RAY AMPUTATION;  Surgeon: [name], MD;  Location: [facility] OR;  Service: Orthopedics;  Laterality: Right;   AMPUTATION Right 09/04/2022   Procedure: RIGHT BELOW KNEE AMPUTATION;  Surgeon: [name], MD;  Location: [facility] OR;  Service: Orthopedics;  Laterality: Right;   AORTIC VALVE REPLACEMENT N/A 08/22/2015   Procedure: AORTIC VALVE REPLACEMENT (AVR);  Surgeon: Kerin Perna, MD;  Location: [facility] OR;  Service: Open Heart Surgery;  Laterality: N/A;   ATRIAL FIBRILLATION ABLATION  01/30/2012   PVI by Dr. Johney Frame   ATRIAL FIBRILLATION ABLATION N/A 01/31/2012   Procedure: ATRIAL FIBRILLATION ABLATION;  Surgeon: [name], MD;  Location: [facility] CATH LAB;  Service: Cardiovascular;  Laterality: N/A;   ATRIAL FIBRILLATION ABLATION N/A 04/19/2020   Procedure: ATRIAL FIBRILLATION ABLATION;  Surgeon: [name], MD;  Location: [facility] INVASIVE CV LAB;  Service: Cardiovascular;  Laterality: N/A;   BACK SURGERY     X 3   BIOPSY   01/01/2022   Procedure: BIOPSY;  Surgeon: Kathi Der, MD;  Location: [facility] ENDOSCOPY;  Service: Gastroenterology;;   CARDIAC CATHETERIZATION N/A 08/09/2015   Procedure: Right/Left Heart Cath and Coronary Angiography;  Surgeon: Peter M Swaziland, MD;  Location: [facility] INVASIVE CV LAB;  Service: Cardiovascular;  Laterality: N/A;   CARDIAC CATHETERIZATION N/A 02/05/2016   Procedure: Right/Left Heart Cath and Coronary Angiography;  Surgeon: Corky Crafts, MD;  Location: [facility] INVASIVE CV LAB;  Service: Cardiovascular;  Laterality: N/A;   CARDIAC CATHETERIZATION N/A 04/17/2016   Procedure: Right Heart Cath;  Surgeon: Dolores Patty, MD;  Location: [facility] INVASIVE CV LAB;  Service: Cardiovascular;  Laterality: N/A;   CARDIOVASCULAR STRESS TEST  03/12/2002   EF 48%, NO EVIDENCE OF ISCHEMIA   CARDIOVERSION  01/2012; 03/31/2012   CARDIOVERSION N/A 02/25/2012   Procedure: CARDIOVERSION;  Surgeon: [name], MD;  Location: [facility] CATH LAB;  Service: Cardiovascular;  Laterality: N/A;   CARDIOVERSION N/A 03/31/2012   Procedure: CARDIOVERSION;  Surgeon: [name], MD;  Location: [facility] CATH LAB;  Service: Cardiovascular;  Laterality: N/A;   CARDIOVERSION N/A 11/23/2015   Procedure: CARDIOVERSION;  Surgeon: Laurey Morale, MD;  Location: [facility] ENDOSCOPY;  Service: Cardiovascular;  Laterality: N/A;   CARDIOVERSION N/A 04/08/2016   Procedure: CARDIOVERSION;  Surgeon: Dolores Patty, MD;  Location: [facility] ENDOSCOPY;  Service: Cardiovascular;  Laterality: N/A;   CARDIOVERSION N/A 09/14/2018   Procedure: CARDIOVERSION;  Surgeon: Wendall Stade, MD;  Location: [facility] ENDOSCOPY;  Service: Cardiovascular;  Laterality: N/A;   CARDIOVERSION N/A 10/19/2019   Procedure: CARDIOVERSION;  Surgeon: Dolores Patty, MD;  Location: [facility] ENDOSCOPY;  Service: Cardiovascular;  Laterality: N/A;   CARDIOVERSION N/A 02/25/2020   Procedure: CARDIOVERSION;  Surgeon: Pricilla Riffle, MD;  Location: [facility] ENDOSCOPY;  Service: Cardiovascular;   Laterality: N/A;   CARDIOVERSION N/A 05/25/2020   Procedure: CARDIOVERSION;  Surgeon: Lewayne Bunting, MD;  Location: [facility] ENDOSCOPY;  Service: Cardiovascular;  Laterality: N/A;   CARDIOVERSION N/A 07/10/2020   Procedure: CARDIOVERSION;  Surgeon:  Jake Bathe, MD;  Location: [facility] ENDOSCOPY;  Service: Cardiovascular;  Laterality: N/A;   CLIPPING OF ATRIAL APPENDAGE N/A 08/22/2015   Procedure: CLIPPING OF ATRIAL APPENDAGE;  Surgeon: Kerin Perna, MD;  Location: [facility] OR;  Service: Open Heart Surgery;  Laterality: N/A;   COLONOSCOPY WITH PROPOFOL N/A 01/01/2022   Procedure: COLONOSCOPY WITH PROPOFOL;  Surgeon: Kathi Der, MD;  Location: [facility] ENDOSCOPY;  Service: Gastroenterology;  Laterality: N/A;   DOPPLER ECHOCARDIOGRAPHY  03/11/2002   EF 70-75%   FINGER SURGERY Left    Middle finger   FINGER TENDON REPAIR  1980's   "right little finger" (03/31/2012)   FLEXIBLE SIGMOIDOSCOPY N/A 12/30/2021   Procedure: FLEXIBLE SIGMOIDOSCOPY;  Surgeon: Charlott Rakes, MD;  Location: [facility] ENDOSCOPY;  Service: Gastroenterology;  Laterality: N/A;   INSERT / REPLACE / REMOVE PACEMAKER     St Jude   IR RADIOLOGIST EVAL & MGMT  05/06/2019   IR RADIOLOGIST EVAL & MGMT  10/06/2019   IR RADIOLOGIST EVAL & MGMT  10/31/2020   IR RADIOLOGIST EVAL & MGMT  01/18/2021   IR RADIOLOGIST EVAL & MGMT  03/08/2021   IR RADIOLOGIST EVAL & MGMT  06/13/2021   KIDNEY SURGERY Left    LEFT AND RIGHT HEART CATHETERIZATION WITH CORONARY ANGIOGRAM N/A 10/27/2012   Procedure: LEFT AND RIGHT HEART CATHETERIZATION WITH CORONARY ANGIOGRAM;  Surgeon: Pamella Pert, MD;  Location: [facility] CATH LAB;  Service: Cardiovascular;  Laterality: N/A;   LUMBAR DISC SURGERY  1980's X2;  2000's   MAZE N/A 08/22/2015   Procedure: MAZE;  Surgeon: Kerin Perna, MD;  Location: [facility] OR;  Service: Open Heart Surgery;  Laterality: N/A;   PACEMAKER INSERTION  03/31/2012   STJ Accent DR pacemaker implanted by Dr Johney Frame   PERMANENT PACEMAKER INSERTION N/A  03/31/2012   Procedure: PERMANENT PACEMAKER INSERTION;  Surgeon: [name], MD;  Location: [facility] CATH LAB;  Service: Cardiovascular;  Laterality: N/A;   PPM GENERATOR CHANGEOUT N/A 08/08/2020   Procedure: PPM GENERATOR CHANGEOUT;  Surgeon: [name], MD;  Location: [facility] INVASIVE CV LAB;  Service: Cardiovascular;  Laterality: N/A;   RADIOLOGY WITH ANESTHESIA Left 02/14/2021   Procedure: RADIOLOGY WITH ANESTHESIA LEFT RENAL CRYOABLATION;  Surgeon: Irish Lack, MD;  Location: [facility] ORS;  Service: Radiology;  Laterality: Left;   STUMP REVISION Right 10/09/2022   Procedure: REVISION RIGHT BELOW KNEE AMPUTATION;  Surgeon: [name], MD;  Location: [facility] OR;  Service: Orthopedics;  Laterality: Right;   STUMP REVISION Right 11/16/2022   Procedure: REVISION RIGHT BELOW KNEE AMPUTATION;  Surgeon: [name], MD;  Location: [facility] OR;  Service: Orthopedics;  Laterality: Right;   TEE WITHOUT CARDIOVERSION  01/30/2012   Procedure: TRANSESOPHAGEAL ECHOCARDIOGRAM (TEE);  Surgeon: Laurey Morale, MD;  Location: [facility] ENDOSCOPY;  Service: Cardiovascular;  Laterality: N/A;  ablation next day   TEE WITHOUT CARDIOVERSION N/A 08/22/2015   Procedure: TRANSESOPHAGEAL ECHOCARDIOGRAM (TEE);  Surgeon: Kerin Perna, MD;  Location: [facility] OR;  Service: Open Heart Surgery;  Laterality: N/A;   TEE WITHOUT CARDIOVERSION N/A 10/19/2019   Procedure: TRANSESOPHAGEAL ECHOCARDIOGRAM (TEE);  Surgeon: Dolores Patty, MD;  Location: [facility] ENDOSCOPY;  Service: Cardiovascular;  Laterality: N/A;   US ECHOCARDIOGRAPHY  08/07/2009   EF 55-60%   Social History   Occupational History   Occupation: retired  Tobacco Use   Smoking status: Former    Current packs/day: 0.00    Average packs/day: 1 pack/day for 30.2 years (30.2 ttl pk-yrs)    Types: Cigarettes  Start date: 1963    Quit date: 07/18/1991    Years since quitting: 31.3    Passive exposure: Never   Smokeless tobacco: Never  Vaping Use   Vaping status: Never Used   Substance and Sexual Activity   Alcohol use: Not Currently    Comment: occasional   Drug use: Yes    Types: Marijuana    Comment: Marijuana- for nausea   Sexual activity: Not Currently

## 2022-12-03 ENCOUNTER — Other Ambulatory Visit: Payer: Self-pay | Admitting: Family

## 2022-12-04 ENCOUNTER — Other Ambulatory Visit: Payer: Self-pay | Admitting: Orthopedic Surgery

## 2022-12-04 ENCOUNTER — Telehealth: Payer: Self-pay | Admitting: Orthopedic Surgery

## 2022-12-04 MED ORDER — OXYCODONE-ACETAMINOPHEN 5-325 MG PO TABS: 1.0000 | ORAL_TABLET | Freq: Four times a day (QID) | ORAL | 0 refills | Status: DC | PRN

## 2022-12-05 ENCOUNTER — Encounter: Payer: Self-pay | Admitting: Orthopedic Surgery

## 2022-12-05 ENCOUNTER — Ambulatory Visit (INDEPENDENT_AMBULATORY_CARE_PROVIDER_SITE_OTHER): Payer: Medicare Other | Admitting: Orthopedic Surgery

## 2022-12-05 DIAGNOSIS — Z89511 Acquired absence of right leg below knee: Secondary | ICD-10-CM

## 2022-12-05 MED ORDER — SULFAMETHOXAZOLE-TRIMETHOPRIM 800-160 MG PO TABS: 1.0000 | ORAL_TABLET | Freq: Two times a day (BID) | ORAL | 0 refills | Status: DC

## 2022-12-05 NOTE — Progress Notes (Signed)
Office Visit Note   Patient: [name]           Date of Birth: [birth year]           MRN: [id number] Visit Date: 12/05/2022              Requested by: Alysia Penna, MD [address] [place],  [place] [address] PCP: Alysia Penna, MD  Chief Complaint  Patient presents with   Right Leg - Routine Post Op    10/09/2022 revision right BKA  11/16/2022 revision right BKA          HPI: Patient is a 71 year old gentleman status post revision right transtibial amputation.  Patient has been on doxycycline he states he has had increased pain over the past several days.  Assessment & Plan: Visit Diagnoses:  1. S/P BKA (below knee amputation), right (HCC)     Plan: Will call in a prescription for Bactrim DS.  He is allergic to cephalexin.  With patient's acute symptoms discussed that this may either be an acute infection resistant to the doxycycline or may be ischemic changes in the muscle.  The rash on his hands may also be a reaction to the doxycycline.  He will continue with compression again reinforced the importance of protein drink supplements twice a day.  Follow-Up Instructions: Return in about 1 week (around 12/12/2022).   Ortho Exam  Patient is alert, oriented, no adenopathy, well-dressed, normal affect, normal respiratory effort. Examination there is no redness or cellulitis on the residual limb.  He has clear serosanguineous drainage there is no wound dehiscence.  Patient has full active extension.  Patient has dry cracked peeling skin on his hands  Imaging: No results found. No images are attached to the encounter.  Labs: Lab Results  Component Value Date   HGBA1C 6.3 (H) 11/13/2022   HGBA1C 6.5 (H) 12/27/2021   HGBA1C 6.6 (H) 06/11/2021   ESRSEDRATE 60 (H) 09/04/2015   REPTSTATUS 10/22/2022 FINAL 10/17/2022   GRAMSTAIN NO WBC SEEN NO ORGANISMS SEEN  10/09/2022   CULT  10/17/2022    NO GROWTH 5 DAYS Performed at [facility], [address],  [place], [place] [address]    [facility] STAPHYLOCOCCUS AUREUS 10/09/2022   LABORGA STAPHYLOCOCCUS EPIDERMIDIS 10/09/2022     Lab Results  Component Value Date   ALBUMIN 3.9 11/13/2022   ALBUMIN 3.3 (L) 10/17/2022   ALBUMIN 3.6 09/04/2022   PREALBUMIN 25 09/04/2022    Lab Results  Component Value Date   MG 2.0 06/11/2021   MG 2.3 12/10/2020   MG 2.4 05/29/2020   No results found for: "VD25OH"  Lab Results  Component Value Date   PREALBUMIN 25 09/04/2022      Latest Ref Rng & Units 11/18/2022    8:56 AM 11/17/2022    3:14 AM 11/16/2022    2:39 AM  CBC EXTENDED  WBC 4.0 - 10.5 K/uL 8.1  10.0  8.4   RBC 4.22 - 5.81 MIL/uL 3.19  3.43  4.29   Hemoglobin 13.0 - 17.0 g/dL 9.2  9.9  42.7   HCT 06.2 - 52.0 % 29.3  30.5  38.3   Platelets 150 - 400 K/uL 119  159  160   NEUT# 1.7 - 7.7 K/uL   6.1   Lymph# 0.7 - 4.0 K/uL   1.3      There is no height or weight on file to calculate BMI.  Orders:  No orders of the defined types were  placed in this encounter.  No orders of the defined types were placed in this encounter.    Procedures: No procedures performed  Clinical Data: No additional findings.  ROS:  All other systems negative, except as noted in the HPI. Review of Systems  Objective: Vital Signs: There were no vitals taken for this visit.  Specialty Comments:  No specialty comments available.  PMFS History: Patient Active Problem List   Diagnosis Date Noted   Fall 11/14/2022   Pain of right lower extremity due to injury 11/14/2022   Diabetic ulcer of lower leg (HCC) 11/13/2022   Osteomyelitis (HCC) 11/13/2022   Dyslipidemia 10/18/2022   Cellulitis of right lower extremity 10/17/2022   Dehiscence of amputation stump of right lower extremity (HCC) 10/09/2022   Below-knee amputation of right lower extremity (HCC) 10/09/2022   Gangrene of right foot (HCC) 09/04/2022   Hx of BKA, right (HCC) 09/04/2022   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) 08/23/2022    CAP (community acquired pneumonia) 12/28/2021   Constipation 12/28/2021   Pain in both lower extremities 07/03/2021   Peripheral neuropathy 07/03/2021   Acute metabolic encephalopathy 06/09/2021   Marijuana dependence (HCC) 06/09/2021   DNR (do not resuscitate) 06/09/2021   Chest pain 06/08/2021   Renal lesion 02/14/2021   Status post cryoablation 02/14/2021   Allergic rhinitis 12/08/2020   Preop pulmonary/respiratory exam 03/27/2020   Acute on chronic diastolic (congestive) heart failure (HCC) 01/22/2020   Abdominal pain 01/22/2020   Hypothyroidism 12/13/2019   DM2 (diabetes mellitus, type 2) (HCC) 12/13/2019   Chronic kidney disease, stage 3b (HCC) 12/13/2019   Acute bacterial bronchitis 12/13/2019   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   Typical atrial flutter (HCC)    Chronic respiratory failure with hypoxia (HCC) 05/23/2016   CAD (coronary artery disease) 05/23/2016   Persistent atrial fibrillation (HCC)    Atypical atrial flutter (HCC)    Visit for monitoring Tikosyn therapy 04/16/2016   Chronic diastolic CHF (congestive heart failure) (HCC) 02/03/2016   Cough 12/06/2015   Acute on chronic respiratory failure with hypoxia (HCC) 09/08/2015   Hallucinations 09/04/2015   Lymphadenopathy 09/04/2015   Ascending aortic aneurysm (HCC) 09/04/2015   Hypoxia 09/02/2015   S/P aortic valve replacement with bioprosthetic valve 09/02/2015   Pleural effusion 09/02/2015   S/P AVR 08/22/2015   GERD without esophagitis 09/20/2014   Intrinsic asthma 10/13/2013   Dyspnea 09/15/2013   Pulmonary hypertension (HCC) 09/15/2013   Shortness of breath 05/05/2013   Pacemaker-St.Jude 04/01/2012   Bradycardia 03/31/2012   Second degree AV block 03/31/2012   AV block, 1st degree 02/01/2012   PAF (paroxysmal atrial fibrillation) (HCC) 11/13/2011   Fatigue 11/13/2011   Essential hypertension 11/13/2011   Aortic stenosis 11/13/2011   Sleep apnea 11/13/2011   Past Medical History:  Diagnosis  Date   Aortic stenosis, mild    Arthritis of hand    "just a little bit in both hands" (03/31/2012)   Asthma    "little bit" (03/31/2012)   Atrial fibrillation (HCC)    dx '04; DCCV '04, placed on flecainide, failed DCCV 04/2010, flecainide stopped; s/p successful A.fib ablation 01/31/12   CHF (congestive heart failure) (HCC)    Controlled type 2 diabetes mellitus without complication, without long-term current use of insulin (HCC) 12/13/2019   COPD (chronic obstructive pulmonary disease) (HCC)    DJD (degenerative joint disease)    Fatty liver    Fibromyalgia    GERD (gastroesophageal reflux disease)    Hyperlipidemia  Hypertension    Hypothyroidism    S/P radiation   Left atrial enlargement    LA size 45mm by echo 11/21/11   Mitral regurgitation    trivial   Obstructive sleep apnea    mild by sleep study 2013; pt stated he does not have a machine because "it wasn't bad enough for him to have one"   Pacemaker 03/31/2012   Pneumonia    last time 2023   Restless leg syndrome    Second degree AV block    Splenomegaly     Family History  Problem Relation Age of Onset   Heart failure Mother    Stroke Father    Neuropathy Neg Hx     Past Surgical History:  Procedure Laterality Date   AMPUTATION Right 08/23/2022   Procedure: RIGHT FOOT FIFTH RAY AMPUTATION;  Surgeon: [name], MD;  Location: [facility] OR;  Service: Orthopedics;  Laterality: Right;   AMPUTATION Right 09/04/2022   Procedure: RIGHT BELOW KNEE AMPUTATION;  Surgeon: [name], MD;  Location: [facility] OR;  Service: Orthopedics;  Laterality: Right;   AORTIC VALVE REPLACEMENT N/A 08/22/2015   Procedure: AORTIC VALVE REPLACEMENT (AVR);  Surgeon: Kerin Perna, MD;  Location: [facility] OR;  Service: Open Heart Surgery;  Laterality: N/A;   ATRIAL FIBRILLATION ABLATION  01/30/2012   PVI by Dr. Johney Frame   ATRIAL FIBRILLATION ABLATION N/A 01/31/2012   Procedure: ATRIAL FIBRILLATION ABLATION;  Surgeon: [name], MD;  Location: [facility]  CATH LAB;  Service: Cardiovascular;  Laterality: N/A;   ATRIAL FIBRILLATION ABLATION N/A 04/19/2020   Procedure: ATRIAL FIBRILLATION ABLATION;  Surgeon: [name], MD;  Location: [facility] INVASIVE CV LAB;  Service: Cardiovascular;  Laterality: N/A;   BACK SURGERY     X 3   BIOPSY  01/01/2022   Procedure: BIOPSY;  Surgeon: Kathi Der, MD;  Location: [facility] ENDOSCOPY;  Service: Gastroenterology;;   CARDIAC CATHETERIZATION N/A 08/09/2015   Procedure: Right/Left Heart Cath and Coronary Angiography;  Surgeon: Peter M Swaziland, MD;  Location: [facility] INVASIVE CV LAB;  Service: Cardiovascular;  Laterality: N/A;   CARDIAC CATHETERIZATION N/A 02/05/2016   Procedure: Right/Left Heart Cath and Coronary Angiography;  Surgeon: Corky Crafts, MD;  Location: [facility] INVASIVE CV LAB;  Service: Cardiovascular;  Laterality: N/A;   CARDIAC CATHETERIZATION N/A 04/17/2016   Procedure: Right Heart Cath;  Surgeon: Dolores Patty, MD;  Location: [facility] INVASIVE CV LAB;  Service: Cardiovascular;  Laterality: N/A;   CARDIOVASCULAR STRESS TEST  03/12/2002   EF 48%, NO EVIDENCE OF ISCHEMIA   CARDIOVERSION  01/2012; 03/31/2012   CARDIOVERSION N/A 02/25/2012   Procedure: CARDIOVERSION;  Surgeon: [name], MD;  Location: [facility] CATH LAB;  Service: Cardiovascular;  Laterality: N/A;   CARDIOVERSION N/A 03/31/2012   Procedure: CARDIOVERSION;  Surgeon: [name], MD;  Location: [facility] CATH LAB;  Service: Cardiovascular;  Laterality: N/A;   CARDIOVERSION N/A 11/23/2015   Procedure: CARDIOVERSION;  Surgeon: Laurey Morale, MD;  Location: [facility] ENDOSCOPY;  Service: Cardiovascular;  Laterality: N/A;   CARDIOVERSION N/A 04/08/2016   Procedure: CARDIOVERSION;  Surgeon: Dolores Patty, MD;  Location: [facility] ENDOSCOPY;  Service: Cardiovascular;  Laterality: N/A;   CARDIOVERSION N/A 09/14/2018   Procedure: CARDIOVERSION;  Surgeon: Wendall Stade, MD;  Location: [facility] ENDOSCOPY;  Service: Cardiovascular;  Laterality: N/A;   CARDIOVERSION N/A  10/19/2019   Procedure: CARDIOVERSION;  Surgeon: Dolores Patty, MD;  Location: [facility] ENDOSCOPY;  Service: Cardiovascular;  Laterality: N/A;   CARDIOVERSION N/A 02/25/2020   Procedure:  CARDIOVERSION;  Surgeon: Pricilla Riffle, MD;  Location: [facility] ENDOSCOPY;  Service: Cardiovascular;  Laterality: N/A;   CARDIOVERSION N/A 05/25/2020   Procedure: CARDIOVERSION;  Surgeon: Lewayne Bunting, MD;  Location: [facility] ENDOSCOPY;  Service: Cardiovascular;  Laterality: N/A;   CARDIOVERSION N/A 07/10/2020   Procedure: CARDIOVERSION;  Surgeon: Jake Bathe, MD;  Location: [facility] ENDOSCOPY;  Service: Cardiovascular;  Laterality: N/A;   CLIPPING OF ATRIAL APPENDAGE N/A 08/22/2015   Procedure: CLIPPING OF ATRIAL APPENDAGE;  Surgeon: Kerin Perna, MD;  Location: [facility] OR;  Service: Open Heart Surgery;  Laterality: N/A;   COLONOSCOPY WITH PROPOFOL N/A 01/01/2022   Procedure: COLONOSCOPY WITH PROPOFOL;  Surgeon: Kathi Der, MD;  Location: [facility] ENDOSCOPY;  Service: Gastroenterology;  Laterality: N/A;   DOPPLER ECHOCARDIOGRAPHY  03/11/2002   EF 70-75%   FINGER SURGERY Left    Middle finger   FINGER TENDON REPAIR  1980's   "right little finger" (03/31/2012)   FLEXIBLE SIGMOIDOSCOPY N/A 12/30/2021   Procedure: FLEXIBLE SIGMOIDOSCOPY;  Surgeon: Charlott Rakes, MD;  Location: [facility] ENDOSCOPY;  Service: Gastroenterology;  Laterality: N/A;   INSERT / REPLACE / REMOVE PACEMAKER     St Jude   IR RADIOLOGIST EVAL & MGMT  05/06/2019   IR RADIOLOGIST EVAL & MGMT  10/06/2019   IR RADIOLOGIST EVAL & MGMT  10/31/2020   IR RADIOLOGIST EVAL & MGMT  01/18/2021   IR RADIOLOGIST EVAL & MGMT  03/08/2021   IR RADIOLOGIST EVAL & MGMT  06/13/2021   KIDNEY SURGERY Left    LEFT AND RIGHT HEART CATHETERIZATION WITH CORONARY ANGIOGRAM N/A 10/27/2012   Procedure: LEFT AND RIGHT HEART CATHETERIZATION WITH CORONARY ANGIOGRAM;  Surgeon: Pamella Pert, MD;  Location: [facility] CATH LAB;  Service: Cardiovascular;  Laterality: N/A;   LUMBAR DISC  SURGERY  1980's X2;  2000's   MAZE N/A 08/22/2015   Procedure: MAZE;  Surgeon: Kerin Perna, MD;  Location: [facility] OR;  Service: Open Heart Surgery;  Laterality: N/A;   PACEMAKER INSERTION  03/31/2012   STJ Accent DR pacemaker implanted by Dr Johney Frame   PERMANENT PACEMAKER INSERTION N/A 03/31/2012   Procedure: PERMANENT PACEMAKER INSERTION;  Surgeon: [name], MD;  Location: [facility] CATH LAB;  Service: Cardiovascular;  Laterality: N/A;   PPM GENERATOR CHANGEOUT N/A 08/08/2020   Procedure: PPM GENERATOR CHANGEOUT;  Surgeon: [name], MD;  Location: [facility] INVASIVE CV LAB;  Service: Cardiovascular;  Laterality: N/A;   RADIOLOGY WITH ANESTHESIA Left 02/14/2021   Procedure: RADIOLOGY WITH ANESTHESIA LEFT RENAL CRYOABLATION;  Surgeon: Irish Lack, MD;  Location: [facility] ORS;  Service: Radiology;  Laterality: Left;   STUMP REVISION Right 10/09/2022   Procedure: REVISION RIGHT BELOW KNEE AMPUTATION;  Surgeon: [name], MD;  Location: [facility] OR;  Service: Orthopedics;  Laterality: Right;   STUMP REVISION Right 11/16/2022   Procedure: REVISION RIGHT BELOW KNEE AMPUTATION;  Surgeon: [name], MD;  Location: [facility] OR;  Service: Orthopedics;  Laterality: Right;   TEE WITHOUT CARDIOVERSION  01/30/2012   Procedure: TRANSESOPHAGEAL ECHOCARDIOGRAM (TEE);  Surgeon: Laurey Morale, MD;  Location: [facility] ENDOSCOPY;  Service: Cardiovascular;  Laterality: N/A;  ablation next day   TEE WITHOUT CARDIOVERSION N/A 08/22/2015   Procedure: TRANSESOPHAGEAL ECHOCARDIOGRAM (TEE);  Surgeon: Kerin Perna, MD;  Location: [facility] OR;  Service: Open Heart Surgery;  Laterality: N/A;   TEE WITHOUT CARDIOVERSION N/A 10/19/2019   Procedure: TRANSESOPHAGEAL ECHOCARDIOGRAM (TEE);  Surgeon: Dolores Patty, MD;  Location: [facility] ENDOSCOPY;  Service: Cardiovascular;  Laterality: N/A;  US ECHOCARDIOGRAPHY  08/07/2009   EF 55-60%   Social History   Occupational History   Occupation: retired  Tobacco Use   Smoking status: Former     Current packs/day: 0.00    Average packs/day: 1 pack/day for 30.2 years (30.2 ttl pk-yrs)    Types: Cigarettes    Start date: 1963    Quit date: 07/18/1991    Years since quitting: 31.4    Passive exposure: Never   Smokeless tobacco: Never  Vaping Use   Vaping status: Never Used  Substance and Sexual Activity   Alcohol use: Not Currently    Comment: occasional   Drug use: Yes    Types: Marijuana    Comment: Marijuana- for nausea   Sexual activity: Not Currently

## 2022-12-12 ENCOUNTER — Encounter: Payer: Self-pay | Admitting: Orthopedic Surgery

## 2022-12-12 ENCOUNTER — Ambulatory Visit (INDEPENDENT_AMBULATORY_CARE_PROVIDER_SITE_OTHER): Payer: Medicare Other | Admitting: Orthopedic Surgery

## 2022-12-12 DIAGNOSIS — Z89511 Acquired absence of right leg below knee: Secondary | ICD-10-CM

## 2022-12-12 DIAGNOSIS — T8781 Dehiscence of amputation stump: Secondary | ICD-10-CM

## 2022-12-12 NOTE — Progress Notes (Signed)
Office Visit Note   Patient: [name]           Date of Birth: [birth year]           MRN: [id number] Visit Date: 12/12/2022              Requested by: Alysia Penna, MD [address] [place],  [place] [address] PCP: Alysia Penna, MD  Chief Complaint  Patient presents with   Right Leg - Routine Post Op    10/09/2022 revision right BKA  11/16/2022 revision right BKA      HPI: Patient is a 72 year old gentleman status post revision right transtibial amputation about 4 weeks ago.  Patient is currently on Bactrim DS was previously on doxycycline.  Assessment & Plan: Visit Diagnoses:  1. S/P BKA (below knee amputation), right (HCC)   2. Dehiscence of amputation stump of right lower extremity (HCC)     Plan: Recommended continue elevation compression Bactrim DS and protein supplements twice a day.  Follow-Up Instructions: Return in about 4 weeks (around 01/09/2023).   Ortho Exam  Patient is alert, oriented, no adenopathy, well-dressed, normal affect, normal respiratory effort. Examination the dermatitis has resolved there is no cellulitis.  There is no drainage the swelling is decreased the incision is well-approximated sutures harvested today.  Imaging: No results found. No images are attached to the encounter.  Labs: Lab Results  Component Value Date   HGBA1C 6.3 (H) 11/13/2022   HGBA1C 6.5 (H) 12/27/2021   HGBA1C 6.6 (H) 06/11/2021   ESRSEDRATE 60 (H) 09/04/2015   REPTSTATUS 10/22/2022 FINAL 10/17/2022   GRAMSTAIN NO WBC SEEN NO ORGANISMS SEEN  10/09/2022   CULT  10/17/2022    NO GROWTH 5 DAYS Performed at [facility], [address] [place], [place] [address]    [facility] STAPHYLOCOCCUS AUREUS 10/09/2022   LABORGA STAPHYLOCOCCUS EPIDERMIDIS 10/09/2022     Lab Results  Component Value Date   ALBUMIN 3.9 11/13/2022   ALBUMIN 3.3 (L) 10/17/2022   ALBUMIN 3.6 09/04/2022   PREALBUMIN 25 09/04/2022    Lab Results  Component Value Date   MG  2.0 06/11/2021   MG 2.3 12/10/2020   MG 2.4 05/29/2020   No results found for: "VD25OH"  Lab Results  Component Value Date   PREALBUMIN 25 09/04/2022      Latest Ref Rng & Units 11/18/2022    8:56 AM 11/17/2022    3:14 AM 11/16/2022    2:39 AM  CBC EXTENDED  WBC 4.0 - 10.5 K/uL 8.1  10.0  8.4   RBC 4.22 - 5.81 MIL/uL 3.19  3.43  4.29   Hemoglobin 13.0 - 17.0 g/dL 9.2  9.9  27.2   HCT 53.6 - 52.0 % 29.3  30.5  38.3   Platelets 150 - 400 K/uL 119  159  160   NEUT# 1.7 - 7.7 K/uL   6.1   Lymph# 0.7 - 4.0 K/uL   1.3      There is no height or weight on file to calculate BMI.  Orders:  No orders of the defined types were placed in this encounter.  No orders of the defined types were placed in this encounter.    Procedures: No procedures performed  Clinical Data: No additional findings.  ROS:  All other systems negative, except as noted in the HPI. Review of Systems  Objective: Vital Signs: There were no vitals taken for this visit.  Specialty Comments:  No specialty comments available.  PMFS  History: Patient Active Problem List   Diagnosis Date Noted   Fall 11/14/2022   Pain of right lower extremity due to injury 11/14/2022   Diabetic ulcer of lower leg (HCC) 11/13/2022   Osteomyelitis (HCC) 11/13/2022   Dyslipidemia 10/18/2022   Cellulitis of right lower extremity 10/17/2022   Dehiscence of amputation stump of right lower extremity (HCC) 10/09/2022   Below-knee amputation of right lower extremity (HCC) 10/09/2022   Gangrene of right foot (HCC) 09/04/2022   Hx of BKA, right (HCC) 09/04/2022   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) 08/23/2022   CAP (community acquired pneumonia) 12/28/2021   Constipation 12/28/2021   Pain in both lower extremities 07/03/2021   Peripheral neuropathy 07/03/2021   Acute metabolic encephalopathy 06/09/2021   Marijuana dependence (HCC) 06/09/2021   DNR (do not resuscitate) 06/09/2021   Chest pain 06/08/2021   Renal  lesion 02/14/2021   Status post cryoablation 02/14/2021   Allergic rhinitis 12/08/2020   Preop pulmonary/respiratory exam 03/27/2020   Acute on chronic diastolic (congestive) heart failure (HCC) 01/22/2020   Abdominal pain 01/22/2020   Hypothyroidism 12/13/2019   DM2 (diabetes mellitus, type 2) (HCC) 12/13/2019   Chronic kidney disease, stage 3b (HCC) 12/13/2019   Acute bacterial bronchitis 12/13/2019   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   Typical atrial flutter (HCC)    Chronic respiratory failure with hypoxia (HCC) 05/23/2016   CAD (coronary artery disease) 05/23/2016   Persistent atrial fibrillation (HCC)    Atypical atrial flutter (HCC)    Visit for monitoring Tikosyn therapy 04/16/2016   Chronic diastolic CHF (congestive heart failure) (HCC) 02/03/2016   Cough 12/06/2015   Acute on chronic respiratory failure with hypoxia (HCC) 09/08/2015   Hallucinations 09/04/2015   Lymphadenopathy 09/04/2015   Ascending aortic aneurysm (HCC) 09/04/2015   Hypoxia 09/02/2015   S/P aortic valve replacement with bioprosthetic valve 09/02/2015   Pleural effusion 09/02/2015   S/P AVR 08/22/2015   GERD without esophagitis 09/20/2014   Intrinsic asthma 10/13/2013   Dyspnea 09/15/2013   Pulmonary hypertension (HCC) 09/15/2013   Shortness of breath 05/05/2013   Pacemaker-St.Jude 04/01/2012   Bradycardia 03/31/2012   Second degree AV block 03/31/2012   AV block, 1st degree 02/01/2012   PAF (paroxysmal atrial fibrillation) (HCC) 11/13/2011   Fatigue 11/13/2011   Essential hypertension 11/13/2011   Aortic stenosis 11/13/2011   Sleep apnea 11/13/2011   Past Medical History:  Diagnosis Date   Aortic stenosis, mild    Arthritis of hand    "just a little bit in both hands" (03/31/2012)   Asthma    "little bit" (03/31/2012)   Atrial fibrillation (HCC)    dx '04; DCCV '04, placed on flecainide, failed DCCV 04/2010, flecainide stopped; s/p successful A.fib ablation 01/31/12   CHF (congestive heart  failure) (HCC)    Controlled type 2 diabetes mellitus without complication, without long-term current use of insulin (HCC) 12/13/2019   COPD (chronic obstructive pulmonary disease) (HCC)    DJD (degenerative joint disease)    Fatty liver    Fibromyalgia    GERD (gastroesophageal reflux disease)    Hyperlipidemia    Hypertension    Hypothyroidism    S/P radiation   Left atrial enlargement    LA size 45mm by echo 11/21/11   Mitral regurgitation    trivial   Obstructive sleep apnea    mild by sleep study 2013; pt stated he does not have a machine because "it wasn't bad enough for him to have one"  Pacemaker 03/31/2012   Pneumonia    last time 2023   Restless leg syndrome    Second degree AV block    Splenomegaly     Family History  Problem Relation Age of Onset   Heart failure Mother    Stroke Father    Neuropathy Neg Hx     Past Surgical History:  Procedure Laterality Date   AMPUTATION Right 08/23/2022   Procedure: RIGHT FOOT FIFTH RAY AMPUTATION;  Surgeon: [name], MD;  Location[facility] OR;  Service: Orthopedics;  Laterality: Right;   AMPUTATION Right 09/04/2022   Procedure: RIGHT BELOW KNEE AMPUTATION;  Surgeon: [name], MD;  Location: [facility] OR;  Service: Orthopedics;  Laterality: Right;   AORTIC VALVE REPLACEMENT N/A 08/22/2015   Procedure: AORTIC VALVE REPLACEMENT (AVR);  Surgeon: Kerin Perna, MD;  Location: [facility] OR;  Service: Open Heart Surgery;  Laterality: N/A;   ATRIAL FIBRILLATION ABLATION  01/30/2012   PVI by Dr. Johney Frame   ATRIAL FIBRILLATION ABLATION N/A 01/31/2012   Procedure: ATRIAL FIBRILLATION ABLATION;  Surgeon: [name], MD;  Location: [facility] CATH LAB;  Service: Cardiovascular;  Laterality: N/A;   ATRIAL FIBRILLATION ABLATION N/A 04/19/2020   Procedure: ATRIAL FIBRILLATION ABLATION;  Surgeon: [name], MD;  Location: [facility] INVASIVE CV LAB;  Service: Cardiovascular;  Laterality: N/A;   BACK SURGERY     X 3   BIOPSY  01/01/2022   Procedure: BIOPSY;   Surgeon: Kathi Der, MD;  Location: [facility] ENDOSCOPY;  Service: Gastroenterology;;   CARDIAC CATHETERIZATION N/A 08/09/2015   Procedure: Right/Left Heart Cath and Coronary Angiography;  Surgeon: Peter M Swaziland, MD;  Location: [facility] INVASIVE CV LAB;  Service: Cardiovascular;  Laterality: N/A;   CARDIAC CATHETERIZATION N/A 02/05/2016   Procedure: Right/Left Heart Cath and Coronary Angiography;  Surgeon: Corky Crafts, MD;  Location: [facility] INVASIVE CV LAB;  Service: Cardiovascular;  Laterality: N/A;   CARDIAC CATHETERIZATION N/A 04/17/2016   Procedure: Right Heart Cath;  Surgeon: Dolores Patty, MD;  Location: [facility] INVASIVE CV LAB;  Service: Cardiovascular;  Laterality: N/A;   CARDIOVASCULAR STRESS TEST  03/12/2002   EF 48%, NO EVIDENCE OF ISCHEMIA   CARDIOVERSION  01/2012; 03/31/2012   CARDIOVERSION N/A 02/25/2012   Procedure: CARDIOVERSION;  Surgeon: [name], MD;  Location: [facility] CATH LAB;  Service: Cardiovascular;  Laterality: N/A;   CARDIOVERSION N/A 03/31/2012   Procedure: CARDIOVERSION;  Surgeon: [name], MD;  Location: [facility] CATH LAB;  Service: Cardiovascular;  Laterality: N/A;   CARDIOVERSION N/A 11/23/2015   Procedure: CARDIOVERSION;  Surgeon: Laurey Morale, MD;  Location: [facility] ENDOSCOPY;  Service: Cardiovascular;  Laterality: N/A;   CARDIOVERSION N/A 04/08/2016   Procedure: CARDIOVERSION;  Surgeon: Dolores Patty, MD;  Location: [facility] ENDOSCOPY;  Service: Cardiovascular;  Laterality: N/A;   CARDIOVERSION N/A 09/14/2018   Procedure: CARDIOVERSION;  Surgeon: Wendall Stade, MD;  Location: [facility] ENDOSCOPY;  Service: Cardiovascular;  Laterality: N/A;   CARDIOVERSION N/A 10/19/2019   Procedure: CARDIOVERSION;  Surgeon: Dolores Patty, MD;  Location: [facility] ENDOSCOPY;  Service: Cardiovascular;  Laterality: N/A;   CARDIOVERSION N/A 02/25/2020   Procedure: CARDIOVERSION;  Surgeon: Pricilla Riffle, MD;  Location: [facility] ENDOSCOPY;  Service: Cardiovascular;  Laterality: N/A;   CARDIOVERSION  N/A 05/25/2020   Procedure: CARDIOVERSION;  Surgeon: Lewayne Bunting, MD;  Location: [facility] ENDOSCOPY;  Service: Cardiovascular;  Laterality: N/A;   CARDIOVERSION N/A 07/10/2020   Procedure: CARDIOVERSION;  Surgeon: Jake Bathe, MD;  Location: [facility] ENDOSCOPY;  Service: Cardiovascular;  Laterality: N/A;   CLIPPING OF ATRIAL APPENDAGE N/A 08/22/2015   Procedure: CLIPPING OF ATRIAL APPENDAGE;  Surgeon: Kerin Perna, MD;  Location: [facility] OR;  Service: Open Heart Surgery;  Laterality: N/A;   COLONOSCOPY WITH PROPOFOL N/A 01/01/2022   Procedure: COLONOSCOPY WITH PROPOFOL;  Surgeon: Kathi Der, MD;  Location: [facility] ENDOSCOPY;  Service: Gastroenterology;  Laterality: N/A;   DOPPLER ECHOCARDIOGRAPHY  03/11/2002   EF 70-75%   FINGER SURGERY Left    Middle finger   FINGER TENDON REPAIR  1980's   "right little finger" (03/31/2012)   FLEXIBLE SIGMOIDOSCOPY N/A 12/30/2021   Procedure: FLEXIBLE SIGMOIDOSCOPY;  Surgeon: Charlott Rakes, MD;  Location: [facility] ENDOSCOPY;  Service: Gastroenterology;  Laterality: N/A;   INSERT / REPLACE / REMOVE PACEMAKER     St Jude   IR RADIOLOGIST EVAL & MGMT  05/06/2019   IR RADIOLOGIST EVAL & MGMT  10/06/2019   IR RADIOLOGIST EVAL & MGMT  10/31/2020   IR RADIOLOGIST EVAL & MGMT  01/18/2021   IR RADIOLOGIST EVAL & MGMT  03/08/2021   IR RADIOLOGIST EVAL & MGMT  06/13/2021   KIDNEY SURGERY Left    LEFT AND RIGHT HEART CATHETERIZATION WITH CORONARY ANGIOGRAM N/A 10/27/2012   Procedure: LEFT AND RIGHT HEART CATHETERIZATION WITH CORONARY ANGIOGRAM;  Surgeon: Pamella Pert, MD;  Location: [facility] CATH LAB;  Service: Cardiovascular;  Laterality: N/A;   LUMBAR DISC SURGERY  1980's X2;  2000's   MAZE N/A 08/22/2015   Procedure: MAZE;  Surgeon: Kerin Perna, MD;  Location: [facility] OR;  Service: Open Heart Surgery;  Laterality: N/A;   PACEMAKER INSERTION  03/31/2012   STJ Accent DR pacemaker implanted by Dr Johney Frame   PERMANENT PACEMAKER INSERTION N/A 03/31/2012   Procedure: PERMANENT  PACEMAKER INSERTION;  Surgeon: [name], MD;  Location: [facility] CATH LAB;  Service: Cardiovascular;  Laterality: N/A;   PPM GENERATOR CHANGEOUT N/A 08/08/2020   Procedure: PPM GENERATOR CHANGEOUT;  Surgeon: [name], MD;  Location: [facility] INVASIVE CV LAB;  Service: Cardiovascular;  Laterality: N/A;   RADIOLOGY WITH ANESTHESIA Left 02/14/2021   Procedure: RADIOLOGY WITH ANESTHESIA LEFT RENAL CRYOABLATION;  Surgeon: Irish Lack, MD;  Location: [facility] ORS;  Service: Radiology;  Laterality: Left;   STUMP REVISION Right 10/09/2022   Procedure: REVISION RIGHT BELOW KNEE AMPUTATION;  Surgeon: [name], MD;  Location: [facility] OR;  Service: Orthopedics;  Laterality: Right;   STUMP REVISION Right 11/16/2022   Procedure: REVISION RIGHT BELOW KNEE AMPUTATION;  Surgeon: [name], MD;  Location: [facility] OR;  Service: Orthopedics;  Laterality: Right;   TEE WITHOUT CARDIOVERSION  01/30/2012   Procedure: TRANSESOPHAGEAL ECHOCARDIOGRAM (TEE);  Surgeon: Laurey Morale, MD;  Location: [facility] ENDOSCOPY;  Service: Cardiovascular;  Laterality: N/A;  ablation next day   TEE WITHOUT CARDIOVERSION N/A 08/22/2015   Procedure: TRANSESOPHAGEAL ECHOCARDIOGRAM (TEE);  Surgeon: Kerin Perna, MD;  Location: [facility] OR;  Service: Open Heart Surgery;  Laterality: N/A;   TEE WITHOUT CARDIOVERSION N/A 10/19/2019   Procedure: TRANSESOPHAGEAL ECHOCARDIOGRAM (TEE);  Surgeon: Dolores Patty, MD;  Location: [facility] ENDOSCOPY;  Service: Cardiovascular;  Laterality: N/A;   US ECHOCARDIOGRAPHY  08/07/2009   EF 55-60%   Social History   Occupational History   Occupation: retired  Tobacco Use   Smoking status: Former    Current packs/day: 0.00    Average packs/day: 1 pack/day for 30.2 years (30.2 ttl pk-yrs)    Types: Cigarettes    Start date: 1963    Quit date: 07/18/1991  Years since quitting: 31.4    Passive exposure: Never   Smokeless tobacco: Never  Vaping Use   Vaping status: Never Used  Substance and Sexual Activity    Alcohol use: Not Currently    Comment: occasional   Drug use: Yes    Types: Marijuana    Comment: Marijuana- for nausea   Sexual activity: Not Currently

## 2022-12-16 ENCOUNTER — Other Ambulatory Visit: Payer: Self-pay | Admitting: Orthopedic Surgery

## 2022-12-16 ENCOUNTER — Encounter: Payer: Medicare Other | Admitting: Orthopedic Surgery

## 2022-12-16 ENCOUNTER — Telehealth: Payer: Self-pay | Admitting: Orthopedic Surgery

## 2022-12-16 MED ORDER — OXYCODONE-ACETAMINOPHEN 5-325 MG PO TABS: 1.0000 | ORAL_TABLET | Freq: Three times a day (TID) | ORAL | 0 refills | Status: DC | PRN

## 2022-12-16 NOTE — Telephone Encounter (Signed)
Patient would like oxycodone called in to [facility] in [place]. Wife calling. Cb# [phone]

## 2022-12-19 ENCOUNTER — Other Ambulatory Visit: Payer: Self-pay

## 2022-12-19 ENCOUNTER — Emergency Department (HOSPITAL_COMMUNITY): Payer: Medicare Other

## 2022-12-19 ENCOUNTER — Telehealth (HOSPITAL_COMMUNITY): Payer: Self-pay | Admitting: Cardiology

## 2022-12-19 ENCOUNTER — Emergency Department (HOSPITAL_COMMUNITY)
Admission: EM | Admit: 2022-12-19 | Discharge: 2022-12-19 | Disposition: A | Discharge status: Home / Self Care | Payer: Medicare Other | Attending: Emergency Medicine | Admitting: Emergency Medicine

## 2022-12-19 DIAGNOSIS — E119 Type 2 diabetes mellitus without complications: Secondary | ICD-10-CM | POA: Diagnosis not present

## 2022-12-19 DIAGNOSIS — Z1152 Encounter for screening for COVID-19: Secondary | ICD-10-CM | POA: Diagnosis not present

## 2022-12-19 DIAGNOSIS — Z7901 Long term (current) use of anticoagulants: Secondary | ICD-10-CM | POA: Insufficient documentation

## 2022-12-19 DIAGNOSIS — R110 Nausea: Secondary | ICD-10-CM | POA: Insufficient documentation

## 2022-12-19 DIAGNOSIS — I509 Heart failure, unspecified: Secondary | ICD-10-CM | POA: Diagnosis not present

## 2022-12-19 DIAGNOSIS — I110 Hypertensive heart disease with heart failure: Secondary | ICD-10-CM | POA: Insufficient documentation

## 2022-12-19 DIAGNOSIS — R1033 Periumbilical pain: Secondary | ICD-10-CM | POA: Diagnosis present

## 2022-12-19 DIAGNOSIS — Z79899 Other long term (current) drug therapy: Secondary | ICD-10-CM | POA: Insufficient documentation

## 2022-12-19 DIAGNOSIS — K5900 Constipation, unspecified: Secondary | ICD-10-CM | POA: Insufficient documentation

## 2022-12-19 DIAGNOSIS — Z7984 Long term (current) use of oral hypoglycemic drugs: Secondary | ICD-10-CM | POA: Insufficient documentation

## 2022-12-19 DIAGNOSIS — R109 Unspecified abdominal pain: Secondary | ICD-10-CM

## 2022-12-19 LAB — CBC WITH DIFFERENTIAL/PLATELET
Abs Immature Granulocytes: 0.06 10*3/uL (ref 0.00–0.07)
Basophils Absolute: 0.1 10*3/uL (ref 0.0–0.1)
Basophils Relative: 1 %
Eosinophils Absolute: 0.1 10*3/uL (ref 0.0–0.5)
Eosinophils Relative: 1 %
HCT: 36.8 % — ABNORMAL LOW (ref 39.0–52.0)
Hemoglobin: 11.5 g/dL — ABNORMAL LOW (ref 13.0–17.0)
Immature Granulocytes: 1 %
Lymphocytes Relative: 9 %
Lymphs Abs: 0.8 10*3/uL (ref 0.7–4.0)
MCH: 27.2 pg (ref 26.0–34.0)
MCHC: 31.3 g/dL (ref 30.0–36.0)
MCV: 87 fL (ref 80.0–100.0)
Monocytes Absolute: 0.4 10*3/uL (ref 0.1–1.0)
Monocytes Relative: 5 %
Neutro Abs: 7.3 10*3/uL (ref 1.7–7.7)
Neutrophils Relative %: 83 %
Platelets: 253 10*3/uL (ref 150–400)
RBC: 4.23 MIL/uL (ref 4.22–5.81)
RDW: 16.2 % — ABNORMAL HIGH (ref 11.5–15.5)
WBC: 8.7 10*3/uL (ref 4.0–10.5)
nRBC: 0 % (ref 0.0–0.2)

## 2022-12-19 LAB — URINALYSIS, W/ REFLEX TO CULTURE (INFECTION SUSPECTED)
Bilirubin Urine: NEGATIVE
Glucose, UA: >=500 mg/dL — AB
Hgb urine dipstick: NEGATIVE
Ketones, ur: 5 mg/dL — AB
Leukocytes,Ua: NEGATIVE
Nitrite: NEGATIVE
Protein, ur: 100 mg/dL — AB
Specific Gravity, Urine: 1.014 (ref 1.005–1.030)
pH: 7 (ref 5.0–8.0)

## 2022-12-19 LAB — COMPREHENSIVE METABOLIC PANEL
ALT: 17 U/L (ref 0–44)
AST: 29 U/L (ref 15–41)
Albumin: 4.2 g/dL (ref 3.5–5.0)
Alkaline Phosphatase: 65 U/L (ref 38–126)
Anion gap: 14 (ref 5–15)
BUN: 17 mg/dL (ref 8–23)
CO2: 27 mmol/L (ref 22–32)
Calcium: 9.9 mg/dL (ref 8.9–10.3)
Chloride: 95 mmol/L — ABNORMAL LOW (ref 98–111)
Creatinine, Ser: 1.76 mg/dL — ABNORMAL HIGH (ref 0.61–1.24)
GFR, Estimated: 40 mL/min — ABNORMAL LOW (ref >60)
Glucose, Bld: 112 mg/dL — ABNORMAL HIGH (ref 70–99)
Potassium: 3.9 mmol/L (ref 3.5–5.1)
Sodium: 136 mmol/L (ref 135–145)
Total Bilirubin: 0.7 mg/dL (ref 0.3–1.2)
Total Protein: 7.9 g/dL (ref 6.5–8.1)

## 2022-12-19 LAB — CBG MONITORING, ED: Glucose-Capillary: 93 mg/dL (ref 70–99)

## 2022-12-19 LAB — TROPONIN I (HIGH SENSITIVITY): Troponin I (High Sensitivity): 16 ng/L (ref <18)

## 2022-12-19 LAB — LIPASE, BLOOD: Lipase: 23 U/L (ref 11–51)

## 2022-12-19 LAB — SARS CORONAVIRUS 2 BY RT PCR: SARS Coronavirus 2 by RT PCR: NEGATIVE

## 2022-12-19 MED ORDER — ONDANSETRON HCL 4 MG/2ML IJ SOLN
4.0000 mg | Freq: Once | INTRAMUSCULAR | Status: AC
  Administered 2022-12-19: 4 mg via INTRAVENOUS
  Filled 2022-12-19: qty 2

## 2022-12-19 MED ORDER — IOHEXOL 350 MG/ML SOLN
60.0000 mL | Freq: Once | INTRAVENOUS | Status: AC | PRN
  Administered 2022-12-19: 60 mL via INTRAVENOUS

## 2022-12-19 MED ORDER — POLYETHYLENE GLYCOL 3350 17 G PO PACK: 17.0000 g | PACK | Freq: Two times a day (BID) | ORAL | 0 refills | Status: DC | PRN

## 2022-12-19 MED ORDER — DOCUSATE SODIUM 100 MG PO CAPS: 100.0000 mg | ORAL_CAPSULE | Freq: Two times a day (BID) | ORAL | 0 refills | Status: DC

## 2022-12-19 MED ORDER — HYDROMORPHONE HCL 1 MG/ML IJ SOLN
1.0000 mg | Freq: Once | INTRAMUSCULAR | Status: AC
  Administered 2022-12-19: 1 mg via INTRAVENOUS
  Filled 2022-12-19: qty 1

## 2022-12-19 MED ORDER — LACTATED RINGERS IV BOLUS
500.0000 mL | Freq: Once | INTRAVENOUS | Status: AC
  Administered 2022-12-19: 500 mL via INTRAVENOUS

## 2022-12-19 NOTE — ED Notes (Signed)
Asked pt if he could try to provide urine sample. He said he is unable at the time but will let me know when he can.

## 2022-12-19 NOTE — ED Notes (Signed)
Pt returned from ultrasound

## 2022-12-19 NOTE — ED Notes (Signed)
Patient transported to CT 

## 2022-12-19 NOTE — ED Notes (Signed)
Patient transported to Ultrasound 

## 2022-12-19 NOTE — Progress Notes (Signed)
Provided listening and emotional support.  Will follow as needed.  [name], [place], [facility], Pager [phone]

## 2022-12-19 NOTE — Telephone Encounter (Signed)
Patient called to request samples as he is currently in the donut hole   Medication Samples have been provided to the patient.  Drug name: eliquis       Strength: 5 mg        Qty: 28  LOT: [id number]  Exp.Date: 12/2023  Dosing instructions: ONE TAB TWICE A DAY  The patient has been instructed regarding the correct time, dose, and frequency of taking this medication, including desired effects and most common side effects.   Theresia Bough 3:49 PM 12/19/2022

## 2022-12-19 NOTE — ED Triage Notes (Signed)
Pt c/o generalized bilateral leg and umbilical pain that started today; no known injury; R BKA; denies fevers, denies urinary symptoms; endorses nausea, no vomiting; hx CHF, arthritis, DM, HTN, afib, splenomegaly

## 2022-12-19 NOTE — ED Provider Notes (Signed)
[place] [facility] DEPARTMENT AT [facility] Provider Note   CSN: [id number] Arrival date & time: 12/19/22  9024     History  No chief complaint on file.   [name] is a 72 y.o. male.  HPI 72 year old male presents with a chief complaint of nausea and leg pain.  History is from patient and wife.  He has been dealing with leg pain and even nausea for a while though over the last few days he has had significant nausea causing him to not eat and not take his meds.  He just does not feel well.  He is also having some abdominal pain.  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  He had a little bit of chest pain starting last night but none now.  Has had a little bit of cough during this time but no fevers.  However he has felt cold.  No dysuria though sometimes he feels like he has a hard time emptying his bladder.  He is on oxycodone for pain.  The leg pain is a chronic problem though does seem to be worse recently.  Home Medications Prior to Admission medications   Medication Sig Start Date End Date Taking? Authorizing Provider  docusate sodium (COLACE) 100 MG capsule Take 1 capsule (100 mg total) by mouth every 12 (twelve) hours. 12/19/22  Yes Pricilla Loveless, MD  polyethylene glycol (MIRALAX / GLYCOLAX) 17 g packet Take 17 g by mouth 2 (two) times daily as needed. 12/19/22  Yes Pricilla Loveless, MD  albuterol (PROVENTIL HFA;VENTOLIN HFA) 108 (90 Base) MCG/ACT inhaler Inhale 2 puffs into the lungs every 6 (six) hours as needed for wheezing or shortness of breath. 12/28/15   Leslye Peer, MD  amLODipine (NORVASC) 5 MG tablet Take 5 mg by mouth in the morning.    [provider]  apixaban (ELIQUIS) 5 MG TABS tablet Take 1 tablet (5 mg total) by mouth 2 (two) times daily. 08/21/22   Graciella Freer, PA-C  bisoprolol (ZEBETA) 5 MG tablet Take 1 tablet (5 mg total) by mouth daily. Please call office to arrange follow  up appointment 11/29/22   Bensimhon, Bevelyn Buckles, MD  Budeson-Glycopyrrol-Formoterol (BREZTRI AEROSPHERE) 160-9-4.8 MCG/ACT AERO Inhale 2 puffs into the lungs in the morning and at bedtime. 05/23/22   Leslye Peer, MD  Cholecalciferol (VITAMIN D) 50 MCG (2000 UT) tablet Take 8,000 Units by mouth daily.    [provider]  Dextromethorphan-guaiFENesin (MUCINEX DM MAXIMUM STRENGTH) 60-1200 MG TB12 Take 1 tablet by mouth daily.    [provider]  empagliflozin (JARDIANCE) 10 MG TABS tablet Take 1 tablet (10 mg total) by mouth daily. 05/27/22   Bensimhon, Bevelyn Buckles, MD  gabapentin (NEURONTIN) 300 MG capsule Take 2 capsules (600 mg total) by mouth 3 (three) times daily. for neuropathy 10/19/22   Rai, Delene Ruffini, MD  hydrOXYzine (ATARAX) 10 MG tablet TAKE 1 TABLET(10 MG) BY MOUTH THREE TIMES DAILY AS NEEDED 12/04/22   [name], MD  levothyroxine (SYNTHROID, LEVOTHROID) 175 MCG tablet Take 175 mcg by mouth daily before breakfast.    [provider]  Menthol-Camphor (TIGER BALM ARTHRITIS RUB EX) Apply 1 application topically as needed (for leg pain).    [provider]  montelukast (SINGULAIR) 10 MG tablet TAKE 1 TABLET(10 MG) BY MOUTH AT BEDTIME Patient taking differently: Take 10 mg by mouth daily. 06/27/22   Leslye Peer, MD  Multiple Minerals-Vitamins (CAL-MAG-ZINC-D) TABS Take 1 tablet by mouth daily.    [provider]  Multiple Vitamin (MULTIVITAMIN WITH MINERALS) TABS tablet Take 1 tablet by mouth daily. 12/15/19   Marguerita Merles Latif, DO  ondansetron (ZOFRAN) 4 MG tablet Take 1 tablet (4 mg total) by mouth every 8 (eight) hours as needed for nausea or vomiting. 10/19/22   Rai, Delene Ruffini, MD  oxyCODONE-acetaminophen (PERCOCET/ROXICET) 5-325 MG tablet Take 1 tablet by mouth every 8 (eight) hours as needed for moderate pain. 12/16/22   [name], MD  OXYGEN Inhale 2 L/min into the lungs See admin instructions. 2 L/min at bedtime and 2 L/min throughout the  day as needed for shortness of breath    [provider]  pantoprazole (PROTONIX) 40 MG tablet Take 40 mg by mouth daily before breakfast.    [provider]  Polyethyl Glyc-Propyl Glyc PF (SYSTANE ULTRA PF) 0.4-0.3 % SOLN Place 1 drop into both eyes 2 (two) times daily as needed (dry eyes).    [provider]  potassium chloride SA (KLOR-CON M) 20 MEQ tablet Take 20 mEq by mouth See admin instructions. Take 20 meq twice daily every other day    [provider]  predniSONE (DELTASONE) 2.5 MG tablet TAKE 1 TABLET(2.5 MG) BY MOUTH DAILY WITH BREAKFAST 11/29/22   Byrum, Les Pou, MD  predniSONE (DELTASONE) 5 MG tablet Take 0.5 tablets (2.5 mg total) by mouth daily with breakfast. 10/22/21   Byrum, Les Pou, MD  rOPINIRole (REQUIP) 1 MG tablet Take 1 tablet (1 mg total) by mouth in the morning and at bedtime. 01/23/20   Glade Lloyd, MD  rosuvastatin (CRESTOR) 10 MG tablet Take 10 mg by mouth at bedtime.  08/08/16   [provider]  senna-docusate (SENOKOT-S) 8.6-50 MG tablet Take 1 tablet by mouth 2 (two) times daily.    [provider]  spironolactone (ALDACTONE) 25 MG tablet Take 1 tablet (25 mg total) by mouth daily. 02/16/16   Graciella Freer, PA-C  sucralfate (CARAFATE) 1 g tablet Take 1 g by mouth 3 (three) times daily as needed (Acid reflux). 03/27/22   [provider]  sulfamethoxazole-trimethoprim (BACTRIM DS) 800-160 MG tablet Take 1 tablet by mouth 2 (two) times daily. 12/05/22   [name], MD  torsemide (DEMADEX) 10 MG tablet Take 15 mg by mouth every other day.    [provider]  traZODone (DESYREL) 50 MG tablet Take 50 mg by mouth at bedtime. 08/30/20   [provider]      Allergies    Cephalexin, Meloxicam, and Vancomycin    Review of Systems   Review of Systems  Constitutional:  Negative for fever.  Respiratory:  Positive for cough. Negative for shortness of breath.   Cardiovascular:  Positive  for chest pain.  Gastrointestinal:  Positive for abdominal pain, constipation and nausea. Negative for vomiting.  Musculoskeletal:  Positive for arthralgias.    Physical Exam Updated Vital Signs BP 136/85   Pulse 61   Temp 98.1 F (36.7 C)   Resp 17   Ht 5\' 11"  (1.803 m)   Wt 88.5 kg   SpO2 99%   BMI 27.20 kg/m  Physical Exam Vitals and nursing note reviewed.  Constitutional:      General: He is not in acute distress.    Appearance: He is well-developed. He is not ill-appearing or diaphoretic.  HENT:     Head: Normocephalic and atraumatic.  Cardiovascular:     Rate  and Rhythm: Normal rate and regular rhythm.     Pulses:          Dorsalis pedis pulses are 2+ on the left side.     Heart sounds: Normal heart sounds.  Pulmonary:     Effort: Pulmonary effort is normal.     Breath sounds: Normal breath sounds. No wheezing.  Abdominal:     Palpations: Abdomen is soft.     Tenderness: There is abdominal tenderness in the right upper quadrant and right lower quadrant.  Musculoskeletal:     Comments: Right BKA. Mild dehiscence of wound. No overt infection. Looks stable per wife/patient.  Skin:    General: Skin is warm and dry.  Neurological:     Mental Status: He is alert.     ED Results / Procedures / Treatments   Labs (all labs ordered are listed, but only abnormal results are displayed) Labs Reviewed  CBC WITH DIFFERENTIAL/PLATELET - Abnormal; Notable for the following components:      Result Value   Hemoglobin 11.5 (*)    HCT 36.8 (*)    RDW 16.2 (*)    All other components within normal limits  COMPREHENSIVE METABOLIC PANEL - Abnormal; Notable for the following components:   Chloride 95 (*)    Glucose, Bld 112 (*)    Creatinine, Ser 1.76 (*)    GFR, Estimated 40 (*)    All other components within normal limits  URINALYSIS, W/ REFLEX TO CULTURE (INFECTION SUSPECTED) - Abnormal; Notable for the following components:   Glucose, UA >=500 (*)    Ketones, ur 5 (*)     Protein, ur 100 (*)    Bacteria, UA RARE (*)    All other components within normal limits  SARS CORONAVIRUS 2 BY RT PCR  LIPASE, BLOOD  CBG MONITORING, ED  TROPONIN I (HIGH SENSITIVITY)    EKG EKG Interpretation Date/Time:  Thursday December 19 2022 08:49:18 EDT Ventricular Rate:  59 PR Interval:  79 QRS Duration:  183 QT Interval:  504 QTC Calculation: 500 R Axis:   -83  Text Interpretation: Sinus rhythm Short PR interval Nonspecific IVCD with LAD LVH with secondary repolarization abnormality similar to June 2024 Confirmed by Pricilla Loveless [phone]) on 12/19/2022 9:21:22 AM  Radiology US Abdomen Limited RUQ (LIVER/GB)  Result Date: 12/19/2022 CLINICAL DATA:  Right-sided abdominal pain EXAM: ULTRASOUND ABDOMEN LIMITED RIGHT UPPER QUADRANT COMPARISON:  None Available. FINDINGS: Gallbladder: No gallstones or wall thickening visualized. No sonographic Murphy sign noted by sonographer. Common bile duct: Diameter: Normal at 4 mm Liver: No focal lesion identified. Within normal limits in parenchymal echogenicity. Portal vein is patent on color Doppler imaging with normal direction of blood flow towards the liver. Other: None. IMPRESSION: Negative right upper quadrant ultrasound. Electronically Signed   By: Malachy Moan M.D.   On: 12/19/2022 12:33   CT ABDOMEN PELVIS W CONTRAST  Result Date: 12/19/2022 CLINICAL DATA:  Acute onset umbilical pain associated with nausea EXAM: CT ABDOMEN AND PELVIS WITH CONTRAST TECHNIQUE: Multidetector CT imaging of the abdomen and pelvis was performed using the standard protocol following bolus administration of intravenous contrast. RADIATION DOSE REDUCTION: This exam was performed according to the departmental dose-optimization program which includes automated exposure control, adjustment of the mA and/or kV according to patient size and/or use of iterative reconstruction technique. CONTRAST:  60mL OMNIPAQUE IOHEXOL 350 MG/ML SOLN COMPARISON:  CT abdomen  and pelvis dated 10/17/2022 FINDINGS: Lower chest: Partially imaged pacemaker lead terminates in the right ventricle. No  focal consolidation or pulmonary nodule in the lung bases. No pleural effusion or pneumothorax demonstrated. Partially imaged heart size is normal. Hepatobiliary: No focal hepatic lesions. No intra or extrahepatic biliary ductal dilation. Cholelithiasis. Pancreas: No focal lesions or main ductal dilation. Spleen: Decreased size of enlargement in the AP dimension, 14.8 cm, from 16.5 cm. Adrenals/Urinary Tract: No adrenal nodules. Atrophic right kidney. Post ablated changes of the posterior interpolar left kidney. No suspicious renal mass, calculi or hydronephrosis. No focal bladder wall thickening. Stomach/Bowel: Normal appearance of the stomach. No evidence of bowel wall thickening, distention, or inflammatory changes. Moderate to large volume stool throughout the colon. Normal appendix. Vascular/Lymphatic: Aortic atherosclerosis. No enlarged abdominal or pelvic lymph nodes. Reproductive: Prostate is unremarkable. Other: Small volume free fluid.  No free air or fluid collection. Musculoskeletal: No acute or abnormal lytic or blastic osseous lesions. Partially imaged median sternotomy wire is nondisplaced. Multilevel degenerative changes of the partially imaged thoracic and lumbar spine. Small fat-containing paraumbilical and bilateral inguinal hernias. IMPRESSION: 1. No acute abdominopelvic findings. 2. Moderate to large volume stool throughout the colon. 3. Decreased splenomegaly. 4. Cholelithiasis. 5.  Aortic Atherosclerosis (ICD10-I70.0). Electronically Signed   By: Agustin Cree M.D.   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 Status post median sternotomy and valve repair. No pleural effusion. No  pneumothorax. Unchanged cardiac and mediastinal contours. No focal airspace opacity. No radiographically apparent displaced rib fractures. Visualized upper abdomen is unremarkable. IMPRESSION: No focal airspace opacity. Electronically Signed   By: Lorenza Cambridge M.D.   On: 12/19/2022 09:06    Procedures Procedures    Medications Ordered in ED Medications  lactated ringers bolus 500 mL (0 mLs Intravenous Stopped 12/19/22 1039)  HYDROmorphone (DILAUDID) injection 1 mg (1 mg Intravenous Given 12/19/22 0933)  ondansetron (ZOFRAN) injection 4 mg (4 mg Intravenous Given 12/19/22 0931)  iohexol (OMNIPAQUE) 350 MG/ML injection 60 mL (60 mLs Intravenous Contrast Given 12/19/22 1040)    ED Course/ Medical Decision Making/ A&P                                 Medical Decision Making Amount and/or Complexity of Data Reviewed Labs: ordered.    Details: UA unremarkable.  COVID test negative.  Normal WBC.  CKD at baseline. Radiology: ordered and independent interpretation performed.    Details: Right sided increased stool burden.  Small gallstone. ECG/medicine tests: ordered and independent interpretation performed.    Details: No ischemia.  Risk OTC drugs. Prescription drug management.   Patient feels a lot better with pain and nausea control.  Vitals are unremarkable.  No fevers.  He had chest pain starting last night but this is would be atypical for ACS and with a negative troponin and unchanged EKG I do not think a second troponin or further workup is needed.  Ultimately, this is probably related to stool.  He has been constipated at home.  Has a sizable stool burden on the right side though to be cautious right upper quadrant ultrasound was obtained and shows no cholecystitis.  Will treatment with nausea meds and bowel regimen and have him follow-up with PCP.  Will discharge with return precautions.        Final Clinical Impression(s) / ED Diagnoses Final diagnoses:  Right sided abdominal  pain  Constipation, unspecified constipation type    Rx / DC Orders ED Discharge Orders          Ordered    polyethylene glycol (MIRALAX / GLYCOLAX) 17 g packet  2 times daily PRN        12/19/22 1315    docusate sodium (COLACE) 100 MG capsule  Every 12 hours        12/19/22 1315              Pricilla Loveless, MD 12/19/22 1325

## 2022-12-19 NOTE — ED Notes (Signed)
Pt wheeled from ed transferred to chair in lobby

## 2022-12-19 NOTE — Discharge Instructions (Signed)
If you develop worsening, continued, or recurrent abdominal pain, uncontrolled vomiting, fever, chest or back pain, or any other new/concerning symptoms then return to the ER for evaluation.  

## 2022-12-22 ENCOUNTER — Observation Stay (HOSPITAL_COMMUNITY)
Admission: EM | Admit: 2022-12-22 | Discharge: 2022-12-24 | Disposition: A | Discharge status: Home / Self Care | Payer: Medicare Other | Attending: Family Medicine | Admitting: Family Medicine

## 2022-12-22 ENCOUNTER — Encounter (HOSPITAL_COMMUNITY): Payer: Self-pay

## 2022-12-22 ENCOUNTER — Emergency Department (HOSPITAL_COMMUNITY): Payer: Medicare Other

## 2022-12-22 ENCOUNTER — Other Ambulatory Visit: Payer: Self-pay

## 2022-12-22 DIAGNOSIS — Z87891 Personal history of nicotine dependence: Secondary | ICD-10-CM | POA: Diagnosis not present

## 2022-12-22 DIAGNOSIS — E860 Dehydration: Secondary | ICD-10-CM | POA: Insufficient documentation

## 2022-12-22 DIAGNOSIS — E1122 Type 2 diabetes mellitus with diabetic chronic kidney disease: Secondary | ICD-10-CM | POA: Diagnosis not present

## 2022-12-22 DIAGNOSIS — Z89511 Acquired absence of right leg below knee: Secondary | ICD-10-CM | POA: Diagnosis not present

## 2022-12-22 DIAGNOSIS — I130 Hypertensive heart and chronic kidney disease with heart failure and stage 1 through stage 4 chronic kidney disease, or unspecified chronic kidney disease: Secondary | ICD-10-CM | POA: Diagnosis not present

## 2022-12-22 DIAGNOSIS — E039 Hypothyroidism, unspecified: Secondary | ICD-10-CM | POA: Diagnosis not present

## 2022-12-22 DIAGNOSIS — J449 Chronic obstructive pulmonary disease, unspecified: Secondary | ICD-10-CM | POA: Diagnosis not present

## 2022-12-22 DIAGNOSIS — J45909 Unspecified asthma, uncomplicated: Secondary | ICD-10-CM | POA: Insufficient documentation

## 2022-12-22 DIAGNOSIS — N1832 Chronic kidney disease, stage 3b: Secondary | ICD-10-CM | POA: Diagnosis not present

## 2022-12-22 DIAGNOSIS — Z79899 Other long term (current) drug therapy: Secondary | ICD-10-CM | POA: Insufficient documentation

## 2022-12-22 DIAGNOSIS — Z952 Presence of prosthetic heart valve: Secondary | ICD-10-CM | POA: Insufficient documentation

## 2022-12-22 DIAGNOSIS — I5033 Acute on chronic diastolic (congestive) heart failure: Secondary | ICD-10-CM | POA: Diagnosis not present

## 2022-12-22 DIAGNOSIS — N179 Acute kidney failure, unspecified: Secondary | ICD-10-CM | POA: Insufficient documentation

## 2022-12-22 DIAGNOSIS — I480 Paroxysmal atrial fibrillation: Secondary | ICD-10-CM | POA: Insufficient documentation

## 2022-12-22 DIAGNOSIS — K5909 Other constipation: Secondary | ICD-10-CM | POA: Diagnosis not present

## 2022-12-22 DIAGNOSIS — Z950 Presence of cardiac pacemaker: Secondary | ICD-10-CM | POA: Diagnosis not present

## 2022-12-22 DIAGNOSIS — R109 Unspecified abdominal pain: Secondary | ICD-10-CM | POA: Diagnosis present

## 2022-12-22 DIAGNOSIS — Z7901 Long term (current) use of anticoagulants: Secondary | ICD-10-CM | POA: Insufficient documentation

## 2022-12-22 DIAGNOSIS — Z7984 Long term (current) use of oral hypoglycemic drugs: Secondary | ICD-10-CM | POA: Insufficient documentation

## 2022-12-22 DIAGNOSIS — K5900 Constipation, unspecified: Principal | ICD-10-CM | POA: Diagnosis present

## 2022-12-22 LAB — CBC
HCT: 43.9 % (ref 39.0–52.0)
Hemoglobin: 13.3 g/dL (ref 13.0–17.0)
MCH: 25.5 pg — ABNORMAL LOW (ref 26.0–34.0)
MCHC: 30.3 g/dL (ref 30.0–36.0)
MCV: 84.3 fL (ref 80.0–100.0)
Platelets: 299 10*3/uL (ref 150–400)
RBC: 5.21 MIL/uL (ref 4.22–5.81)
RDW: 16.1 % — ABNORMAL HIGH (ref 11.5–15.5)
WBC: 14.5 10*3/uL — ABNORMAL HIGH (ref 4.0–10.5)
nRBC: 0 % (ref 0.0–0.2)

## 2022-12-22 LAB — TSH: TSH: 84.537 u[IU]/mL — ABNORMAL HIGH (ref 0.350–4.500)

## 2022-12-22 LAB — COMPREHENSIVE METABOLIC PANEL
ALT: 20 U/L (ref 0–44)
AST: 37 U/L (ref 15–41)
Albumin: 4.7 g/dL (ref 3.5–5.0)
Alkaline Phosphatase: 69 U/L (ref 38–126)
Anion gap: 18 — ABNORMAL HIGH (ref 5–15)
BUN: 22 mg/dL (ref 8–23)
CO2: 20 mmol/L — ABNORMAL LOW (ref 22–32)
Calcium: 10.1 mg/dL (ref 8.9–10.3)
Chloride: 99 mmol/L (ref 98–111)
Creatinine, Ser: 1.94 mg/dL — ABNORMAL HIGH (ref 0.61–1.24)
GFR, Estimated: 36 mL/min — ABNORMAL LOW (ref >60)
Glucose, Bld: 136 mg/dL — ABNORMAL HIGH (ref 70–99)
Potassium: 3.6 mmol/L (ref 3.5–5.1)
Sodium: 137 mmol/L (ref 135–145)
Total Bilirubin: 1 mg/dL (ref 0.3–1.2)
Total Protein: 8.7 g/dL — ABNORMAL HIGH (ref 6.5–8.1)

## 2022-12-22 LAB — I-STAT CHEM 8, ED
BUN: 26 mg/dL — ABNORMAL HIGH (ref 8–23)
Calcium, Ion: 0.8 mmol/L — CL (ref 1.15–1.40)
Chloride: 110 mmol/L (ref 98–111)
Creatinine, Ser: 1.9 mg/dL — ABNORMAL HIGH (ref 0.61–1.24)
Glucose, Bld: 135 mg/dL — ABNORMAL HIGH (ref 70–99)
HCT: 46 % (ref 39.0–52.0)
Hemoglobin: 15.6 g/dL (ref 13.0–17.0)
Potassium: 3.8 mmol/L (ref 3.5–5.1)
Sodium: 133 mmol/L — ABNORMAL LOW (ref 135–145)
TCO2: 17 mmol/L — ABNORMAL LOW (ref 22–32)

## 2022-12-22 LAB — I-STAT CG4 LACTIC ACID, ED
Lactic Acid, Venous: 3.6 mmol/L (ref 0.5–1.9)
Lactic Acid, Venous: 2 mmol/L (ref 0.5–1.9)

## 2022-12-22 LAB — T4, FREE: Free T4: <0.25 ng/dL — ABNORMAL LOW (ref 0.61–1.12)

## 2022-12-22 LAB — LIPASE, BLOOD: Lipase: 23 U/L (ref 11–51)

## 2022-12-22 MED ORDER — LACTATED RINGERS IV SOLN: INTRAVENOUS | Status: DC

## 2022-12-22 MED ORDER — FLUTICASONE FUROATE-VILANTEROL 200-25 MCG/ACT IN AEPB
1.0000 | INHALATION_SPRAY | Freq: Every day | RESPIRATORY_TRACT | Status: DC
  Filled 2022-12-22: qty 28

## 2022-12-22 MED ORDER — PANTOPRAZOLE SODIUM 40 MG PO TBEC
40.0000 mg | DELAYED_RELEASE_TABLET | Freq: Every day | ORAL | Status: DC
  Administered 2022-12-23 – 2022-12-24 (×2): 40 mg via ORAL
  Filled 2022-12-22 (×2): qty 1

## 2022-12-22 MED ORDER — ROSUVASTATIN CALCIUM 5 MG PO TABS
10.0000 mg | ORAL_TABLET | Freq: Every day | ORAL | Status: DC
  Administered 2022-12-22 – 2022-12-23 (×2): 10 mg via ORAL
  Filled 2022-12-22 (×3): qty 2

## 2022-12-22 MED ORDER — APIXABAN 5 MG PO TABS
5.0000 mg | ORAL_TABLET | Freq: Two times a day (BID) | ORAL | Status: DC
  Administered 2022-12-22 – 2022-12-24 (×4): 5 mg via ORAL
  Filled 2022-12-22 (×4): qty 1

## 2022-12-22 MED ORDER — UMECLIDINIUM BROMIDE 62.5 MCG/ACT IN AEPB
1.0000 | INHALATION_SPRAY | Freq: Every day | RESPIRATORY_TRACT | Status: DC
  Filled 2022-12-22: qty 7

## 2022-12-22 MED ORDER — METOCLOPRAMIDE HCL 5 MG/ML IJ SOLN
5.0000 mg | Freq: Once | INTRAMUSCULAR | Status: AC
  Administered 2022-12-22: 5 mg via INTRAVENOUS
  Filled 2022-12-22: qty 2

## 2022-12-22 MED ORDER — METHOCARBAMOL 1000 MG/10ML IJ SOLN
500.0000 mg | Freq: Four times a day (QID) | INTRAVENOUS | Status: DC | PRN
  Filled 2022-12-22: qty 5

## 2022-12-22 MED ORDER — ROPINIROLE HCL 1 MG PO TABS
1.0000 mg | ORAL_TABLET | Freq: Two times a day (BID) | ORAL | Status: DC
  Administered 2022-12-22 – 2022-12-24 (×4): 1 mg via ORAL
  Filled 2022-12-22 (×4): qty 1

## 2022-12-22 MED ORDER — UMECLIDINIUM BROMIDE 62.5 MCG/ACT IN AEPB
1.0000 | INHALATION_SPRAY | Freq: Every day | RESPIRATORY_TRACT | Status: DC
  Administered 2022-12-23 – 2022-12-24 (×2): 1 via RESPIRATORY_TRACT

## 2022-12-22 MED ORDER — AMLODIPINE BESYLATE 5 MG PO TABS
5.0000 mg | ORAL_TABLET | Freq: Every morning | ORAL | Status: DC
  Administered 2022-12-23 – 2022-12-24 (×2): 5 mg via ORAL
  Filled 2022-12-22 (×2): qty 1

## 2022-12-22 MED ORDER — POLYETHYLENE GLYCOL 3350 17 G PO PACK
17.0000 g | PACK | Freq: Every day | ORAL | Status: DC
  Administered 2022-12-23: 17 g via ORAL
  Filled 2022-12-22: qty 1

## 2022-12-22 MED ORDER — MONTELUKAST SODIUM 10 MG PO TABS
10.0000 mg | ORAL_TABLET | Freq: Every day | ORAL | Status: DC
  Administered 2022-12-23 – 2022-12-24 (×2): 10 mg via ORAL
  Filled 2022-12-22 (×2): qty 1

## 2022-12-22 MED ORDER — FLEET ENEMA RE ENEM
1.0000 | ENEMA | Freq: Once | RECTAL | Status: AC
  Administered 2022-12-22: 1 via RECTAL
  Filled 2022-12-22: qty 1

## 2022-12-22 MED ORDER — GABAPENTIN 300 MG PO CAPS
300.0000 mg | ORAL_CAPSULE | Freq: Three times a day (TID) | ORAL | Status: DC
  Administered 2022-12-22 – 2022-12-24 (×5): 300 mg via ORAL
  Filled 2022-12-22 (×5): qty 1

## 2022-12-22 MED ORDER — DIPHENHYDRAMINE HCL 50 MG/ML IJ SOLN
12.5000 mg | Freq: Once | INTRAMUSCULAR | Status: AC
  Administered 2022-12-22: 12.5 mg via INTRAVENOUS
  Filled 2022-12-22: qty 1

## 2022-12-22 MED ORDER — PREDNISONE 2.5 MG PO TABS
2.5000 mg | ORAL_TABLET | Freq: Every day | ORAL | Status: DC
  Administered 2022-12-23 – 2022-12-24 (×2): 2.5 mg via ORAL
  Filled 2022-12-22 (×3): qty 1

## 2022-12-22 MED ORDER — ACETAMINOPHEN 10 MG/ML IV SOLN
1000.0000 mg | Freq: Four times a day (QID) | INTRAVENOUS | Status: AC
  Administered 2022-12-22 – 2022-12-23 (×4): 1000 mg via INTRAVENOUS
  Filled 2022-12-22 (×5): qty 100

## 2022-12-22 MED ORDER — MORPHINE SULFATE (PF) 4 MG/ML IV SOLN
4.0000 mg | Freq: Once | INTRAVENOUS | Status: AC
  Administered 2022-12-22: 4 mg via INTRAVENOUS
  Filled 2022-12-22: qty 1

## 2022-12-22 MED ORDER — GABAPENTIN 300 MG PO CAPS: 600.0000 mg | ORAL_CAPSULE | Freq: Three times a day (TID) | ORAL | Status: DC

## 2022-12-22 MED ORDER — FLUTICASONE FUROATE-VILANTEROL 200-25 MCG/ACT IN AEPB
1.0000 | INHALATION_SPRAY | Freq: Every day | RESPIRATORY_TRACT | Status: DC
  Administered 2022-12-23 – 2022-12-24 (×2): 1 via RESPIRATORY_TRACT

## 2022-12-22 MED ORDER — BUDESON-GLYCOPYRROL-FORMOTEROL 160-9-4.8 MCG/ACT IN AERO: 2.0000 | INHALATION_SPRAY | Freq: Two times a day (BID) | RESPIRATORY_TRACT | Status: DC

## 2022-12-22 MED ORDER — POLYETHYLENE GLYCOL 3350 17 GM/SCOOP PO POWD: 1.0000 | Freq: Once | ORAL | Status: DC

## 2022-12-22 MED ORDER — BISOPROLOL FUMARATE 5 MG PO TABS
5.0000 mg | ORAL_TABLET | Freq: Every day | ORAL | Status: DC
  Administered 2022-12-23 – 2022-12-24 (×2): 5 mg via ORAL
  Filled 2022-12-22 (×3): qty 1

## 2022-12-22 MED ORDER — ALBUTEROL SULFATE (2.5 MG/3ML) 0.083% IN NEBU: 2.5000 mg | INHALATION_SOLUTION | Freq: Four times a day (QID) | RESPIRATORY_TRACT | Status: DC | PRN

## 2022-12-22 MED ORDER — LEVOTHYROXINE SODIUM 75 MCG PO TABS
175.0000 ug | ORAL_TABLET | Freq: Every day | ORAL | Status: DC
  Administered 2022-12-23 – 2022-12-24 (×2): 175 ug via ORAL
  Filled 2022-12-22 (×2): qty 1

## 2022-12-22 MED ORDER — SODIUM CHLORIDE 0.9 % IV BOLUS
1000.0000 mL | Freq: Once | INTRAVENOUS | Status: AC
  Administered 2022-12-22: 1000 mL via INTRAVENOUS

## 2022-12-22 MED ORDER — PROCHLORPERAZINE EDISYLATE 10 MG/2ML IJ SOLN: 10.0000 mg | Freq: Four times a day (QID) | INTRAMUSCULAR | Status: DC | PRN

## 2022-12-22 NOTE — ED Provider Notes (Signed)
[place] EMERGENCY DEPARTMENT AT [facility] Provider Note   CSN: [id number] Arrival date & time: 12/22/22  4098     History  Chief Complaint  Patient presents with   Abdominal Pain   Nausea    [name] is a 72 y.o. male.  72 yo M with chief complaints of abdominal pain.  This is constant mostly about the mid upper abdomen has had intractable nausea and vomiting and not really been able to eat or drink.  He was seen 3 days ago in the emergency department and was evaluated for the same.  Had CT imaging that showed an increase stool burden but no other obvious cause of his symptoms.  Since then the patient's pain has persisted and perhaps worsened.  No fevers.  No known sick contacts.  Has not had a bowel movement since leaving the hospital.  Pain seems to be significantly worse when he eats.   Abdominal Pain      Home Medications Prior to Admission medications   Medication Sig Start Date End Date Taking? Authorizing Provider  albuterol (PROVENTIL HFA;VENTOLIN HFA) 108 (90 Base) MCG/ACT inhaler Inhale 2 puffs into the lungs every 6 (six) hours as needed for wheezing or shortness of breath. 12/28/15   Leslye Peer, MD  amLODipine (NORVASC) 5 MG tablet Take 5 mg by mouth in the morning.    [provider]  apixaban (ELIQUIS) 5 MG TABS tablet Take 1 tablet (5 mg total) by mouth 2 (two) times daily. 08/21/22   Graciella Freer, PA-C  bisoprolol (ZEBETA) 5 MG tablet Take 1 tablet (5 mg total) by mouth daily. Please call office to arrange follow up appointment 11/29/22   Bensimhon, Bevelyn Buckles, MD  Budeson-Glycopyrrol-Formoterol (BREZTRI AEROSPHERE) 160-9-4.8 MCG/ACT AERO Inhale 2 puffs into the lungs in the morning and at bedtime. 05/23/22   Leslye Peer, MD  Cholecalciferol (VITAMIN D) 50 MCG (2000 UT) tablet Take 8,000 Units by mouth daily.    [provider]  Dextromethorphan-guaiFENesin (MUCINEX DM MAXIMUM STRENGTH) 60-1200 MG TB12 Take 1  tablet by mouth daily.    [provider]  docusate sodium (COLACE) 100 MG capsule Take 1 capsule (100 mg total) by mouth every 12 (twelve) hours. 12/19/22   Pricilla Loveless, MD  empagliflozin (JARDIANCE) 10 MG TABS tablet Take 1 tablet (10 mg total) by mouth daily. 05/27/22   Bensimhon, Bevelyn Buckles, MD  gabapentin (NEURONTIN) 300 MG capsule Take 2 capsules (600 mg total) by mouth 3 (three) times daily. for neuropathy 10/19/22   Rai, Delene Ruffini, MD  hydrOXYzine (ATARAX) 10 MG tablet TAKE 1 TABLET(10 MG) BY MOUTH THREE TIMES DAILY AS NEEDED 12/04/22   [name], MD  levothyroxine (SYNTHROID, LEVOTHROID) 175 MCG tablet Take 175 mcg by mouth daily before breakfast.    [provider]  Menthol-Camphor (TIGER BALM ARTHRITIS RUB EX) Apply 1 application topically as needed (for leg pain).    [provider]  montelukast (SINGULAIR) 10 MG tablet TAKE 1 TABLET(10 MG) BY MOUTH AT BEDTIME Patient taking differently: Take 10 mg by mouth daily. 06/27/22   Leslye Peer, MD  Multiple Minerals-Vitamins (CAL-MAG-ZINC-D) TABS Take 1 tablet by mouth daily.    [provider]  Multiple Vitamin (MULTIVITAMIN WITH MINERALS) TABS tablet Take 1 tablet by mouth daily. 12/15/19   Sheikh, Omair Latif, DO  ondansetron (ZOFRAN) 4 MG tablet Take 1 tablet (4 mg total) by mouth every 8 (eight) hours as needed for nausea or  vomiting. 10/19/22   Rai, Delene Ruffini, MD  oxyCODONE-acetaminophen (PERCOCET/ROXICET) 5-325 MG tablet Take 1 tablet by mouth every 8 (eight) hours as needed for moderate pain. 12/16/22   [name], MD  OXYGEN Inhale 2 L/min into the lungs See admin instructions. 2 L/min at bedtime and 2 L/min throughout the day as needed for shortness of breath    [provider]  pantoprazole (PROTONIX) 40 MG tablet Take 40 mg by mouth daily before breakfast.    [provider]  Polyethyl Glyc-Propyl Glyc PF (SYSTANE ULTRA PF) 0.4-0.3 % SOLN Place 1 drop into both eyes 2 (two)  times daily as needed (dry eyes).    [provider]  polyethylene glycol (MIRALAX / GLYCOLAX) 17 g packet Take 17 g by mouth 2 (two) times daily as needed. 12/19/22   Pricilla Loveless, MD  potassium chloride SA (KLOR-CON M) 20 MEQ tablet Take 20 mEq by mouth See admin instructions. Take 20 meq twice daily every other day    [provider]  predniSONE (DELTASONE) 2.5 MG tablet TAKE 1 TABLET(2.5 MG) BY MOUTH DAILY WITH BREAKFAST 11/29/22   Byrum, Les Pou, MD  predniSONE (DELTASONE) 5 MG tablet Take 0.5 tablets (2.5 mg total) by mouth daily with breakfast. 10/22/21   Byrum, Les Pou, MD  rOPINIRole (REQUIP) 1 MG tablet Take 1 tablet (1 mg total) by mouth in the morning and at bedtime. 01/23/20   Glade Lloyd, MD  rosuvastatin (CRESTOR) 10 MG tablet Take 10 mg by mouth at bedtime.  08/08/16   [provider]  senna-docusate (SENOKOT-S) 8.6-50 MG tablet Take 1 tablet by mouth 2 (two) times daily.    [provider]  spironolactone (ALDACTONE) 25 MG tablet Take 1 tablet (25 mg total) by mouth daily. 02/16/16   Graciella Freer, PA-C  sucralfate (CARAFATE) 1 g tablet Take 1 g by mouth 3 (three) times daily as needed (Acid reflux). 03/27/22   [provider]  sulfamethoxazole-trimethoprim (BACTRIM DS) 800-160 MG tablet Take 1 tablet by mouth 2 (two) times daily. 12/05/22   [name], MD  torsemide (DEMADEX) 10 MG tablet Take 15 mg by mouth every other day.    [provider]  traZODone (DESYREL) 50 MG tablet Take 50 mg by mouth at bedtime. 08/30/20   [provider]      Allergies    Cephalexin, Meloxicam, and Vancomycin    Review of Systems   Review of Systems  Gastrointestinal:  Positive for abdominal pain.    Physical Exam Updated Vital Signs BP (!) 147/86   Pulse 60   Temp 98 F (36.7 C) (Oral)   Resp 13   Ht 5\' 11"  (1.803 m)   Wt 88.5 kg   SpO2 100%   BMI 27.20 kg/m  Physical Exam Vitals and nursing note reviewed.   Constitutional:      Appearance: He is well-developed.  HENT:     Head: Normocephalic and atraumatic.  Eyes:     Pupils: Pupils are equal, round, and reactive to light.  Neck:     Vascular: No JVD.  Cardiovascular:     Rate and Rhythm: Normal rate and regular rhythm.     Heart sounds: No murmur heard.    No friction rub. No gallop.  Pulmonary:     Effort: No respiratory distress.     Breath sounds: No wheezing.  Abdominal:     General: There is no distension.     Tenderness: There is abdominal  tenderness. There is no guarding or rebound.     Comments: Significant tenderness on abdominal exam mostly about the upper abdomen.  Musculoskeletal:        General: Normal range of motion.     Cervical back: Normal range of motion and neck supple.     Comments: Right AKA  Skin:    Coloration: Skin is not pale.     Findings: No rash.  Neurological:     Mental Status: He is alert and oriented to person, place, and time.  Psychiatric:        Behavior: Behavior normal.     ED Results / Procedures / Treatments   Labs (all labs ordered are listed, but only abnormal results are displayed) Labs Reviewed  COMPREHENSIVE METABOLIC PANEL - Abnormal; Notable for the following components:      Result Value   CO2 20 (*)    Glucose, Bld 136 (*)    Creatinine, Ser 1.94 (*)    Total Protein 8.7 (*)    GFR, Estimated 36 (*)    Anion gap 18 (*)    All other components within normal limits  CBC - Abnormal; Notable for the following components:   WBC 14.5 (*)    MCH 25.5 (*)    RDW 16.1 (*)    All other components within normal limits  I-STAT CHEM 8, ED - Abnormal; Notable for the following components:   Sodium 133 (*)    BUN 26 (*)    Creatinine, Ser 1.90 (*)    Glucose, Bld 135 (*)    Calcium, Ion 0.80 (*)    TCO2 17 (*)    All other components within normal limits  I-STAT CG4 LACTIC ACID, ED - Abnormal; Notable for the following components:   Lactic Acid, Venous 3.6 (*)    All other  components within normal limits  I-STAT CG4 LACTIC ACID, ED - Abnormal; Notable for the following components:   Lactic Acid, Venous 2.0 (*)    All other components within normal limits  LIPASE, BLOOD  URINALYSIS, ROUTINE W REFLEX MICROSCOPIC    EKG None  Radiology CT ABDOMEN PELVIS WO CONTRAST  Result Date: 12/22/2022 CLINICAL DATA:  Acute abdominal pain.  Nausea. EXAM: CT ABDOMEN AND PELVIS WITHOUT CONTRAST TECHNIQUE: Multidetector CT imaging of the abdomen and pelvis was performed following the standard protocol without IV contrast. RADIATION DOSE REDUCTION: This exam was performed according to the departmental dose-optimization program which includes automated exposure control, adjustment of the mA and/or kV according to patient size and/or use of iterative reconstruction technique. COMPARISON:  12/19/2022 FINDINGS: Lower chest: No acute findings. Hepatobiliary: No mass visualized on this unenhanced exam. Gallstones are seen, however there is no evidence of cholecystitis or biliary dilatation. Pancreas: No mass or inflammatory process visualized on this unenhanced exam. Spleen:  Within normal limits in size. Adrenals/Urinary tract: Moderate right renal atrophy and bilateral renal parenchymal scarring. No evidence of urolithiasis or hydronephrosis. Unremarkable unopacified urinary bladder. Stomach/Bowel: Normal appendix visualized. Large stool burden seen extending through the sigmoid colon, increased since previous study. A transition point is seen in the mid sigmoid colon, without definite No evidence of wall thickening or inflammatory change this site. This may be due to recent defecation, however stricture in the sigmoid colon cannot definitely be excluded. No evidence of inflammatory process or abnormal fluid collections. Vascular/Lymphatic: No pathologically enlarged lymph nodes identified. No evidence of abdominal aortic aneurysm. Reproductive:  No mass or other significant abnormality. Other:  Small umbilical  hernia, which contains only fat. Small left inguinal hernia, which contains only fat. Musculoskeletal:  No suspicious bone lesions identified. IMPRESSION: Large stool burden with transition point in the mid sigmoid colon. No evidence of wall thickening or inflammatory change. This may be due to recent defecation/evacuation, however stricture in the sigmoid colon cannot definitely be excluded. Cholelithiasis. No radiographic evidence of cholecystitis. Small umbilical and left inguinal hernias, which contain only fat. Electronically Signed   By: Danae Orleans M.D.   On: 12/22/2022 09:37    Procedures .Critical Care  Performed by: Melene Plan, DO Authorized by: Melene Plan, DO   Critical care provider statement:    Critical care time (minutes):  35   Critical care time was exclusive of:  Separately billable procedures and treating other patients   Critical care was time spent personally by me on the following activities:  Development of treatment plan with patient or surrogate, discussions with consultants, evaluation of patient's response to treatment, examination of patient, ordering and review of laboratory studies, ordering and review of radiographic studies, ordering and performing treatments and interventions, pulse oximetry, re-evaluation of patient's condition and review of old charts   Care discussed with: admitting provider       Medications Ordered in ED Medications  morphine (PF) 4 MG/ML injection 4 mg (has no administration in time range)  sodium phosphate (FLEET) enema 1 enema (has no administration in time range)  sodium chloride 0.9 % bolus 1,000 mL (1,000 mLs Intravenous New Bag/Given 12/22/22 0815)  metoCLOPramide (REGLAN) injection 5 mg (5 mg Intravenous Given 12/22/22 0816)  diphenhydrAMINE (BENADRYL) injection 12.5 mg (12.5 mg Intravenous Given 12/22/22 0816)    ED Course/ Medical Decision Making/ A&P                                 Medical Decision  Making Amount and/or Complexity of Data Reviewed Labs: ordered. Radiology: ordered.  Risk OTC drugs. Prescription drug management. Decision regarding hospitalization.   72 yo M with a chief complaints of abdominal pain nausea and vomiting.  This has been going on for the better part of 5 or 6 days now.  He was recently seen in the Emergency Department setting and I reviewed that visit.  He had CT imaging at that time that showed an increase stool burden and unfortunately has not yet had a bowel movement since he was in the emergency department.  I suspect that that is likely the cause of his symptoms however the patient is very uncomfortable on exam and has significant abdominal discomfort compelling need to obtain repeat CT imaging.  Patient has an acute kidney injury and only has 1 kidney I do not feel strongly about giving IV contrast.  No anemia.  CT Noncon with concern for possible sigmoid stricture.  I discussed this with Dr. Dwain Sarna, general surgery felt this needed to be first evaluated by GI to see if it was a true stricture or not through sigmoidoscopy.  I discussed the case with Dr. Bosie Clos.  Recommends starting enema therapy.  Will consult formally on the patient.  Discussed with family medicine for admission.  The patients results and plan were reviewed and discussed.   Any x-rays performed were independently reviewed by myself.   Differential diagnosis were considered with the presenting HPI.  Medications  morphine (PF) 4 MG/ML injection 4 mg (has no administration in time range)  sodium phosphate (FLEET) enema 1 enema (has  no administration in time range)  sodium chloride 0.9 % bolus 1,000 mL (1,000 mLs Intravenous New Bag/Given 12/22/22 0815)  metoCLOPramide (REGLAN) injection 5 mg (5 mg Intravenous Given 12/22/22 0816)  diphenhydrAMINE (BENADRYL) injection 12.5 mg (12.5 mg Intravenous Given 12/22/22 0816)    Vitals:   12/22/22 0800 12/22/22 0815 12/22/22 0830 12/22/22  0845  BP: (!) 152/101 (!) 160/92 (!) 155/81 (!) 147/86  Pulse: 61 62 60 60  Resp: 15 19 19 13   Temp:      TempSrc:      SpO2: 100% 100% 97% 100%  Weight:      Height:        Final diagnoses:  Constipation, unspecified constipation type    Admission/ observation were discussed with the admitting physician, patient and/or family and they are comfortable with the plan.          Final Clinical Impression(s) / ED Diagnoses Final diagnoses:  Constipation, unspecified constipation type    Rx / DC Orders ED Discharge Orders     None         Melene Plan, DO 12/22/22 1039

## 2022-12-22 NOTE — ED Notes (Signed)
ED TO INPATIENT HANDOFF REPORT  ED Nurse Name and Phone #: Einar Grad [phone]  S Name/Age/Gender [name] 72 y.o. male Room/Bed: 005C/005C  Code Status   Code Status: DNR  Home/SNF/Other Home Patient oriented to: self, place, time, and situation Is this baseline? Yes   Triage Complete: Triage complete  Chief Complaint Constipation [K59.00]  Triage Note Pt c/o intermittent generalized abdominal pain and nausea x3 days.  Pain score 10/10.  Pt was seen 3 days ago for same and diagnosed w/ constipation.  Last BM unknown.      Allergies Allergies  Allergen Reactions   Cephalexin    Meloxicam Rash and Other (See Comments)    Red Man Syndrome, also   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 syndrome 09/02/15, resolved with diphenhydramine and slowing of rate    Level of Care/Admitting Diagnosis ED Disposition     ED Disposition  Admit   Condition  --   Comment  Hospital Area: [facility] [[id number]]  Level of Care: Med-Surg [[id number]]  May admit patient to [facility] or [facility] if equivalent level of care is available:: No  Covid Evaluation: Asymptomatic - no recent exposure (last 10 days) testing not required  Diagnosis: Constipation [[id number]]  Admitting Physician: Lorayne Bender [[id number]]  Attending Physician: Caro Laroche [[id number]]  Certification:: I certify this patient will need inpatient services for at least 2 midnights  Expected Medical Readiness: 12/24/2022          B Medical/Surgery History Past Medical History:  Diagnosis Date   Aortic stenosis, mild    Arthritis of hand    "just a little bit in both hands" (03/31/2012)   Asthma    "little bit" (03/31/2012)   Atrial fibrillation (HCC)    dx '04; DCCV '04, placed on flecainide, failed DCCV 04/2010, flecainide stopped; s/p successful A.fib ablation 01/31/12   CHF (congestive heart failure) (HCC)    Controlled type 2 diabetes mellitus without complication, without long-term current  use of insulin (HCC) 12/13/2019   COPD (chronic obstructive pulmonary disease) (HCC)    DJD (degenerative joint disease)    Fatty liver    Fibromyalgia    GERD (gastroesophageal reflux disease)    Hyperlipidemia    Hypertension    Hypothyroidism    S/P radiation   Left atrial enlargement    LA size 45mm by echo 11/21/11   Mitral regurgitation    trivial   Obstructive sleep apnea    mild by sleep study 2013; pt stated he does not have a machine because "it wasn't bad enough for him to have one"   Pacemaker 03/31/2012   Pneumonia    last time 2023   Restless leg syndrome    Second degree AV block    Splenomegaly    Past Surgical History:  Procedure Laterality Date   AMPUTATION Right 08/23/2022   Procedure: RIGHT FOOT FIFTH RAY AMPUTATION;  Surgeon: [name], MD;  Location: [facility] OR;  Service: Orthopedics;  Laterality: Right;   AMPUTATION Right 09/04/2022   Procedure: RIGHT BELOW KNEE AMPUTATION;  Surgeon: [name], MD;  Location: [facility] OR;  Service: Orthopedics;  Laterality: Right;   AORTIC VALVE REPLACEMENT N/A 08/22/2015   Procedure: AORTIC VALVE REPLACEMENT (AVR);  Surgeon: Kerin Perna, MD;  Location: [facility] OR;  Service: Open Heart Surgery;  Laterality: N/A;   ATRIAL FIBRILLATION ABLATION  01/30/2012   PVI by Dr. Johney Frame   ATRIAL FIBRILLATION ABLATION N/A 01/31/2012  Procedure: ATRIAL FIBRILLATION ABLATION;  Surgeon: [name], MD;  Location: [facility] CATH LAB;  Service: Cardiovascular;  Laterality: N/A;   ATRIAL FIBRILLATION ABLATION N/A 04/19/2020   Procedure: ATRIAL FIBRILLATION ABLATION;  Surgeon: [name], MD;  Location: [facility] INVASIVE CV LAB;  Service: Cardiovascular;  Laterality: N/A;   BACK SURGERY     X 3   BIOPSY  01/01/2022   Procedure: BIOPSY;  Surgeon: Kathi Der, MD;  Location: [facility] ENDOSCOPY;  Service: Gastroenterology;;   CARDIAC CATHETERIZATION N/A 08/09/2015   Procedure: Right/Left Heart Cath and Coronary Angiography;  Surgeon: Peter M Swaziland, MD;   Location: [facility] INVASIVE CV LAB;  Service: Cardiovascular;  Laterality: N/A;   CARDIAC CATHETERIZATION N/A 02/05/2016   Procedure: Right/Left Heart Cath and Coronary Angiography;  Surgeon: Corky Crafts, MD;  Location: [facility] INVASIVE CV LAB;  Service: Cardiovascular;  Laterality: N/A;   CARDIAC CATHETERIZATION N/A 04/17/2016   Procedure: Right Heart Cath;  Surgeon: Dolores Patty, MD;  Location: [facility] INVASIVE CV LAB;  Service: Cardiovascular;  Laterality: N/A;   CARDIOVASCULAR STRESS TEST  03/12/2002   EF 48%, NO EVIDENCE OF ISCHEMIA   CARDIOVERSION  01/2012; 03/31/2012   CARDIOVERSION N/A 02/25/2012   Procedure: CARDIOVERSION;  Surgeon: [name], MD;  Location: [facility] CATH LAB;  Service: Cardiovascular;  Laterality: N/A;   CARDIOVERSION N/A 03/31/2012   Procedure: CARDIOVERSION;  Surgeon: [name], MD;  Location: [facility] CATH LAB;  Service: Cardiovascular;  Laterality: N/A;   CARDIOVERSION N/A 11/23/2015   Procedure: CARDIOVERSION;  Surgeon: Laurey Morale, MD;  Location: [facility] ENDOSCOPY;  Service: Cardiovascular;  Laterality: N/A;   CARDIOVERSION N/A 04/08/2016   Procedure: CARDIOVERSION;  Surgeon: Dolores Patty, MD;  Location: [facility] ENDOSCOPY;  Service: Cardiovascular;  Laterality: N/A;   CARDIOVERSION N/A 09/14/2018   Procedure: CARDIOVERSION;  Surgeon: Wendall Stade, MD;  Location: [facility] ENDOSCOPY;  Service: Cardiovascular;  Laterality: N/A;   CARDIOVERSION N/A 10/19/2019   Procedure: CARDIOVERSION;  Surgeon: Dolores Patty, MD;  Location: [facility] ENDOSCOPY;  Service: Cardiovascular;  Laterality: N/A;   CARDIOVERSION N/A 02/25/2020   Procedure: CARDIOVERSION;  Surgeon: Pricilla Riffle, MD;  Location: [facility] ENDOSCOPY;  Service: Cardiovascular;  Laterality: N/A;   CARDIOVERSION N/A 05/25/2020   Procedure: CARDIOVERSION;  Surgeon: Lewayne Bunting, MD;  Location: [facility] ENDOSCOPY;  Service: Cardiovascular;  Laterality: N/A;   CARDIOVERSION N/A 07/10/2020   Procedure: CARDIOVERSION;  Surgeon:  Jake Bathe, MD;  Location: [facility] ENDOSCOPY;  Service: Cardiovascular;  Laterality: N/A;   CLIPPING OF ATRIAL APPENDAGE N/A 08/22/2015   Procedure: CLIPPING OF ATRIAL APPENDAGE;  Surgeon: Kerin Perna, MD;  Location: [facility] OR;  Service: Open Heart Surgery;  Laterality: N/A;   COLONOSCOPY WITH PROPOFOL N/A 01/01/2022   Procedure: COLONOSCOPY WITH PROPOFOL;  Surgeon: Kathi Der, MD;  Location: [facility] ENDOSCOPY;  Service: Gastroenterology;  Laterality: N/A;   DOPPLER ECHOCARDIOGRAPHY  03/11/2002   EF 70-75%   FINGER SURGERY Left    Middle finger   FINGER TENDON REPAIR  1980's   "right little finger" (03/31/2012)   FLEXIBLE SIGMOIDOSCOPY N/A 12/30/2021   Procedure: FLEXIBLE SIGMOIDOSCOPY;  Surgeon: Charlott Rakes, MD;  Location: [facility] ENDOSCOPY;  Service: Gastroenterology;  Laterality: N/A;   INSERT / REPLACE / REMOVE PACEMAKER     [employer] Jude   IR RADIOLOGIST EVAL & MGMT  05/06/2019   IR RADIOLOGIST EVAL & MGMT  10/06/2019   IR RADIOLOGIST EVAL & MGMT  10/31/2020   IR RADIOLOGIST EVAL & MGMT  01/18/2021   IR RADIOLOGIST EVAL &  MGMT  03/08/2021   IR RADIOLOGIST EVAL & MGMT  06/13/2021   KIDNEY SURGERY Left    LEFT AND RIGHT HEART CATHETERIZATION WITH CORONARY ANGIOGRAM N/A 10/27/2012   Procedure: LEFT AND RIGHT HEART CATHETERIZATION WITH CORONARY ANGIOGRAM;  Surgeon: Pamella Pert, MD;  Location: [facility] CATH LAB;  Service: Cardiovascular;  Laterality: N/A;   LUMBAR DISC SURGERY  1980's X2;  2000's   MAZE N/A 08/22/2015   Procedure: MAZE;  Surgeon: Kerin Perna, MD;  Location: [facility] OR;  Service: Open Heart Surgery;  Laterality: N/A;   PACEMAKER INSERTION  03/31/2012   STJ Accent DR pacemaker implanted by Dr Johney Frame   PERMANENT PACEMAKER INSERTION N/A 03/31/2012   Procedure: PERMANENT PACEMAKER INSERTION;  Surgeon: [name], MD;  Location: [facility] CATH LAB;  Service: Cardiovascular;  Laterality: N/A;   PPM GENERATOR CHANGEOUT N/A 08/08/2020   Procedure: PPM GENERATOR CHANGEOUT;  Surgeon: [name], MD;  Location: [facility] INVASIVE CV LAB;  Service: Cardiovascular;  Laterality: N/A;   RADIOLOGY WITH ANESTHESIA Left 02/14/2021   Procedure: RADIOLOGY WITH ANESTHESIA LEFT RENAL CRYOABLATION;  Surgeon: Irish Lack, MD;  Location: [facility] ORS;  Service: Radiology;  Laterality: Left;   STUMP REVISION Right 10/09/2022   Procedure: REVISION RIGHT BELOW KNEE AMPUTATION;  Surgeon: [name], MD;  Location: [facility] OR;  Service: Orthopedics;  Laterality: Right;   STUMP REVISION Right 11/16/2022   Procedure: REVISION RIGHT BELOW KNEE AMPUTATION;  Surgeon: [name], MD;  Location: [facility] OR;  Service: Orthopedics;  Laterality: Right;   TEE WITHOUT CARDIOVERSION  01/30/2012   Procedure: TRANSESOPHAGEAL ECHOCARDIOGRAM (TEE);  Surgeon: Laurey Morale, MD;  Location: [facility] ENDOSCOPY;  Service: Cardiovascular;  Laterality: N/A;  ablation next day   TEE WITHOUT CARDIOVERSION N/A 08/22/2015   Procedure: TRANSESOPHAGEAL ECHOCARDIOGRAM (TEE);  Surgeon: Kerin Perna, MD;  Location: [facility] OR;  Service: Open Heart Surgery;  Laterality: N/A;   TEE WITHOUT CARDIOVERSION N/A 10/19/2019   Procedure: TRANSESOPHAGEAL ECHOCARDIOGRAM (TEE);  Surgeon: Dolores Patty, MD;  Location: [facility] ENDOSCOPY;  Service: Cardiovascular;  Laterality: N/A;   US ECHOCARDIOGRAPHY  08/07/2009   EF 55-60%     A IV Location/Drains/Wounds Patient Lines/Drains/Airways Status     Active Line/Drains/Airways     Name Placement date Placement time Site Days   Peripheral IV 12/22/22 20 G Right Antecubital 12/22/22  0815  Antecubital  less than 1   Negative Pressure Wound Therapy Leg Left 10/09/22  1154  --  74   Negative Pressure Wound Therapy Pretibial Proximal;Right 11/16/22  1203  --  36   Wound / Incision (Open or Dehisced) 11/13/22 Dehisced Leg Right;Lower 11/13/22  1607  Leg  39            Intake/Output Last 24 hours No intake or output data in the 24 hours ending 12/22/22 1131  Labs/Imaging Results for orders placed or  performed during the hospital encounter of 12/22/22 (from the past 48 hour(s))  Lipase, blood     Status: None   Collection Time: 12/22/22  7:57 AM  Result Value Ref Range   Lipase 23 11 - 51 U/L    Comment: Performed at [facility], [address], [place], [place] [address]  Comprehensive metabolic panel     Status: Abnormal   Collection Time: 12/22/22  7:57 AM  Result Value Ref Range   Sodium 137 135 - 145 mmol/L   Potassium 3.6 3.5 - 5.1 mmol/L   Chloride 99 98 - 111 mmol/L  CO2 20 (L) 22 - 32 mmol/L   Glucose, Bld 136 (H) 70 - 99 mg/dL    Comment: Glucose reference range applies only to samples taken after fasting for at least 8 hours.   BUN 22 8 - 23 mg/dL   Creatinine, Ser 7.25 (H) 0.61 - 1.24 mg/dL   Calcium 36.6 8.9 - 44.0 mg/dL   Total Protein 8.7 (H) 6.5 - 8.1 g/dL   Albumin 4.7 3.5 - 5.0 g/dL   AST 37 15 - 41 U/L   ALT 20 0 - 44 U/L   Alkaline Phosphatase 69 38 - 126 U/L   Total Bilirubin 1.0 0.3 - 1.2 mg/dL   GFR, Estimated 36 (L) >60 mL/min    Comment: (NOTE) Calculated using the CKD-EPI Creatinine Equation (2021)    Anion gap 18 (H) 5 - 15    Comment: Performed at [facility], [address], [place], [place] [address]  CBC     Status: Abnormal   Collection Time: 12/22/22  7:57 AM  Result Value Ref Range   WBC 14.5 (H) 4.0 - 10.5 K/uL   RBC 5.21 4.22 - 5.81 MIL/uL   Hemoglobin 13.3 13.0 - 17.0 g/dL   HCT 59.5 63.8 - 75.6 %   MCV 84.3 80.0 - 100.0 fL   MCH 25.5 (L) 26.0 - 34.0 pg   MCHC 30.3 30.0 - 36.0 g/dL   RDW 43.3 (H) 29.5 - 18.8 %   Platelets 299 150 - 400 K/uL   nRBC 0.0 0.0 - 0.2 %    Comment: Performed at [facility], [address] [place], [place] [address]  I-stat chem 8, ED (not at [facility], [facility] or [facility])     Status: Abnormal   Collection Time: 12/22/22  8:07 AM  Result Value Ref Range   Sodium 133 (L) 135 - 145 mmol/L   Potassium 3.8 3.5 - 5.1 mmol/L   Chloride 110 98 - 111 mmol/L   BUN 26 (H) 8 - 23 mg/dL   Creatinine,  Ser 6.30 (H) 0.61 - 1.24 mg/dL   Glucose, Bld 160 (H) 70 - 99 mg/dL    Comment: Glucose reference range applies only to samples taken after fasting for at least 8 hours.   Calcium, Ion 0.80 (LL) 1.15 - 1.40 mmol/L   TCO2 17 (L) 22 - 32 mmol/L   Hemoglobin 15.6 13.0 - 17.0 g/dL   HCT 10.9 32.3 - 55.7 %   Comment NOTIFIED PHYSICIAN   I-Stat CG4 Lactic Acid     Status: Abnormal   Collection Time: 12/22/22  8:07 AM  Result Value Ref Range   Lactic Acid, Venous 3.6 (HH) 0.5 - 1.9 mmol/L   Comment NOTIFIED PHYSICIAN   I-Stat CG4 Lactic Acid     Status: Abnormal   Collection Time: 12/22/22 10:01 AM  Result Value Ref Range   Lactic Acid, Venous 2.0 (HH) 0.5 - 1.9 mmol/L   *Note: Due to a large number of results and/or encounters for the requested time period, some results have not been displayed. A complete set of results can be found in Results Review.   CT ABDOMEN PELVIS WO CONTRAST  Result Date: 12/22/2022 CLINICAL DATA:  Acute abdominal pain.  Nausea. EXAM: CT ABDOMEN AND PELVIS WITHOUT CONTRAST TECHNIQUE: Multidetector CT imaging of the abdomen and pelvis was performed following the standard protocol without IV contrast. RADIATION DOSE REDUCTION: This exam was performed according to the departmental dose-optimization program which includes automated exposure control, adjustment of  the mA and/or kV according to patient size and/or use of iterative reconstruction technique. COMPARISON:  12/19/2022 FINDINGS: Lower chest: No acute findings. Hepatobiliary: No mass visualized on this unenhanced exam. Gallstones are seen, however there is no evidence of cholecystitis or biliary dilatation. Pancreas: No mass or inflammatory process visualized on this unenhanced exam. Spleen:  Within normal limits in size. Adrenals/Urinary tract: Moderate right renal atrophy and bilateral renal parenchymal scarring. No evidence of urolithiasis or hydronephrosis. Unremarkable unopacified urinary bladder. Stomach/Bowel:  Normal appendix visualized. Large stool burden seen extending through the sigmoid colon, increased since previous study. A transition point is seen in the mid sigmoid colon, without definite No evidence of wall thickening or inflammatory change this site. This may be due to recent defecation, however stricture in the sigmoid colon cannot definitely be excluded. No evidence of inflammatory process or abnormal fluid collections. Vascular/Lymphatic: No pathologically enlarged lymph nodes identified. No evidence of abdominal aortic aneurysm. Reproductive:  No mass or other significant abnormality. Other: Small umbilical hernia, which contains only fat. Small left inguinal hernia, which contains only fat. Musculoskeletal:  No suspicious bone lesions identified. IMPRESSION: Large stool burden with transition point in the mid sigmoid colon. No evidence of wall thickening or inflammatory change. This may be due to recent defecation/evacuation, however stricture in the sigmoid colon cannot definitely be excluded. Cholelithiasis. No radiographic evidence of cholecystitis. Small umbilical and left inguinal hernias, which contain only fat. Electronically Signed   By: Danae Orleans M.D.   On: 12/22/2022 09:37    Pending Labs Unresulted Labs (From admission, onward)     Start     Ordered   12/23/22 0500  Basic metabolic panel  Tomorrow morning,   R        12/22/22 1114   12/22/22 1059  TSH  Once,   R        12/22/22 1101   12/22/22 0730  Urinalysis, Routine w reflex microscopic -Urine, Clean Catch  Once,   URGENT       Question:  Specimen Source  Answer:  Urine, Clean Catch   12/22/22 0729            Vitals/Pain Today's Vitals   12/22/22 0845 12/22/22 1045 12/22/22 1100 12/22/22 1131  BP: (!) 147/86 (!) 169/110 (!) 166/88   Pulse: 60 (!) 58 62   Resp: 13     Temp:      TempSrc:      SpO2: 100% 100% 100%   Weight:      Height:      PainSc:    9     Isolation Precautions No active  isolations  Medications Medications  sodium phosphate (FLEET) enema 1 enema (has no administration in time range)  amLODipine (NORVASC) tablet 5 mg (has no administration in time range)  bisoprolol (ZEBETA) tablet 5 mg (has no administration in time range)  rosuvastatin (CRESTOR) tablet 10 mg (has no administration in time range)  levothyroxine (SYNTHROID) tablet 175 mcg (has no administration in time range)  pantoprazole (PROTONIX) EC tablet 40 mg (has no administration in time range)  apixaban (ELIQUIS) tablet 5 mg (has no administration in time range)  gabapentin (NEURONTIN) capsule 600 mg (has no administration in time range)  rOPINIRole (REQUIP) tablet 1 mg (has no administration in time range)  Budeson-Glycopyrrol-Formoterol 160-9-4.8 MCG/ACT AERO 2 puff (has no administration in time range)  albuterol (VENTOLIN HFA) 108 (90 Base) MCG/ACT inhaler 2 puff (has no administration in time range)  montelukast (SINGULAIR) tablet  10 mg (has no administration in time range)  predniSONE (DELTASONE) tablet 2.5 mg (has no administration in time range)  lactated ringers infusion (has no administration in time range)  acetaminophen (OFIRMEV) IV 1,000 mg (has no administration in time range)  methocarbamol (ROBAXIN) 500 mg in dextrose 5 % 50 mL IVPB (has no administration in time range)  prochlorperazine (COMPAZINE) injection 10 mg (has no administration in time range)  polyethylene glycol (MIRALAX / GLYCOLAX) packet 17 g (has no administration in time range)  sodium chloride 0.9 % bolus 1,000 mL (1,000 mLs Intravenous New Bag/Given 12/22/22 0815)  metoCLOPramide (REGLAN) injection 5 mg (5 mg Intravenous Given 12/22/22 0816)  diphenhydrAMINE (BENADRYL) injection 12.5 mg (12.5 mg Intravenous Given 12/22/22 0816)  morphine (PF) 4 MG/ML injection 4 mg (4 mg Intravenous Given 12/22/22 1040)    Mobility walks with device     Focused Assessments Abdominal   R Recommendations: See Admitting Provider  Note  Report given to:   Additional Notes: abdominal pain; constipation; AAOx4; VSS

## 2022-12-22 NOTE — Assessment & Plan Note (Addendum)
EF 55-60% Home regimen: bisoprolol 5 mg, spironolactone 25 mg, torsemide 10mg , jardiance 10 mg  - Hold torsemide, spironolactone, and jardiance in the setting of AKI  - AM BMP

## 2022-12-22 NOTE — ED Triage Notes (Signed)
Pt c/o intermittent generalized abdominal pain and nausea x3 days.  Pain score 10/10.  Pt was seen 3 days ago for same and diagnosed w/ constipation.  Last BM unknown.

## 2022-12-22 NOTE — Assessment & Plan Note (Deleted)
Last bowel movement approximately 1 week ago. Has been having abdominal pain and nausea for the past 2 weeks that worsening in the last few days. Has increased oxycodone from two 5mg  tablets daily to 3-4 5mg  tablets daily over the past 4-6 weeks. Has also not been taking home medications, including synthroid, recently due to nausea. CTAP with large stool burden, possible stricture in sigmoid colon.  -admit to [facility], med-surg, attending Dr Linwood Dibbles -Fleet enema -Tap water enema PRN  -Compazine q6hr PRN (due to prolonged QT) -avoid opioids for pain control -IV tylenol -IV robaxin  -home gabapentin 600mg  BID -IVF LR 126mL/hr -clear liquid diet

## 2022-12-22 NOTE — Assessment & Plan Note (Signed)
EF 55-60% Home regimen: bisoprolol 5 mg, spironolactone 25 mg, torsemide 10mg , jardiance 10 mg -Continuing to hold torsemide, spironolactone, and jardiance in the setting of AKI -f/u AM BMP

## 2022-12-22 NOTE — Assessment & Plan Note (Signed)
Last bowel movement approximately 1 week ago.  Abdominal pain improved following small BM and tolerating PO well. -Soap suds enema this PM -Compazine Q6h PRN, has not needed; use caution with prolonged QT -Clear liquid diet, advance as tolerated and per GI recs -Avoiding opioids for pain control, can give one-time does PRN if severe pain -IV tylenol -IV robaxin -Home gabapentin 600mg  BID

## 2022-12-22 NOTE — Consult Note (Signed)
Referring Provider: Dr. Adela Lank Primary Care Physician:  Alysia Penna, MD Primary Gastroenterologist:  Dr. Dulce Sellar   Reason for Consultation:  Abdominal pain; Abnormal imaging; Constipation  HPI: [name] is a 72 y.o. male with diffuse abdominal pain and nausea in the setting of constipation stating that he has not had a bowel movement in 6-7 days when he normally goes twice a week. In ER 12/19/22 with nausea and leg pain and found to have moderate to large stool burden throughout the colon on contrasted CT. He was medically managed and sent home and advised to take Miralax BID and Colace BID but he has not had a BM. Denies vomiting, rectal bleeding, or melena. Passing minimal gas. Colonoscopy 01/01/22 (Dr. Levora Angel) and thickened sigmoid colon noted with biopsies negative for inflammation. Sigmoid diverticulosis and internal hemorrhoids noted. Repeat CT scan today without contrast shows large stool burden extending to sigmoid colon with transition point in mid-sigmoid colon without wall thickening. Question of sigmoid stricture on CT. 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 On Eliquis for Afib. Wife in room.  Past Medical History:  Diagnosis Date   Aortic stenosis, mild    Arthritis of hand    "just a little bit in both hands" (03/31/2012)   Asthma    "little bit" (03/31/2012)   Atrial fibrillation (HCC)    dx '04; DCCV '04, placed on flecainide, failed DCCV 04/2010, flecainide stopped; s/p successful A.fib ablation 01/31/12   CHF (congestive heart failure) (HCC)    Controlled type 2 diabetes mellitus without complication, without long-term current use of insulin (HCC) 12/13/2019   COPD (chronic obstructive pulmonary disease) (HCC)    DJD (degenerative joint disease)    Fatty liver    Fibromyalgia    GERD (gastroesophageal reflux disease)    Hyperlipidemia    Hypertension    Hypothyroidism    S/P radiation   Left atrial enlargement    LA size 45mm by echo 11/21/11   Mitral regurgitation    trivial    Obstructive sleep apnea    mild by sleep study 2013; pt stated he does not have a machine because "it wasn't bad enough for him to have one"   Pacemaker 03/31/2012   Pneumonia    last time 2023   Restless leg syndrome    Second degree AV block    Splenomegaly     Past Surgical History:  Procedure Laterality Date   AMPUTATION Right 08/23/2022   Procedure: RIGHT FOOT FIFTH RAY AMPUTATION;  Surgeon: [name], MD;  Location: [facility] OR;  Service: Orthopedics;  Laterality: Right;   AMPUTATION Right 09/04/2022   Procedure: RIGHT BELOW KNEE AMPUTATION;  Surgeon: [name], MD;  Location: [facility] OR;  Service: Orthopedics;  Laterality: Right;   AORTIC VALVE REPLACEMENT N/A 08/22/2015   Procedure: AORTIC VALVE REPLACEMENT (AVR);  Surgeon: Kerin Perna, MD;  Location: [facility] OR;  Service: Open Heart Surgery;  Laterality: N/A;   ATRIAL FIBRILLATION ABLATION  01/30/2012   PVI by Dr. Johney Frame   ATRIAL FIBRILLATION ABLATION N/A 01/31/2012   Procedure: ATRIAL FIBRILLATION ABLATION;  Surgeon: [name], MD;  Location: [facility] CATH LAB;  Service: Cardiovascular;  Laterality: N/A;   ATRIAL FIBRILLATION ABLATION N/A 04/19/2020   Procedure: ATRIAL FIBRILLATION ABLATION;  Surgeon: [name], MD;  Location: [facility] INVASIVE CV LAB;  Service: Cardiovascular;  Laterality: N/A;   BACK SURGERY     X 3   BIOPSY  01/01/2022   Procedure: BIOPSY;  Surgeon: Kathi Der, MD;  Location:  [facility] ENDOSCOPY;  Service: Gastroenterology;;   CARDIAC CATHETERIZATION N/A 08/09/2015   Procedure: Right/Left Heart Cath and Coronary Angiography;  Surgeon: Peter M Swaziland, MD;  Location: [facility] INVASIVE CV LAB;  Service: Cardiovascular;  Laterality: N/A;   CARDIAC CATHETERIZATION N/A 02/05/2016   Procedure: Right/Left Heart Cath and Coronary Angiography;  Surgeon: Corky Crafts, MD;  Location: [facility] INVASIVE CV LAB;  Service: Cardiovascular;  Laterality: N/A;   CARDIAC CATHETERIZATION N/A 04/17/2016   Procedure: Right Heart Cath;  Surgeon:  Dolores Patty, MD;  Location: [facility] INVASIVE CV LAB;  Service: Cardiovascular;  Laterality: N/A;   CARDIOVASCULAR STRESS TEST  03/12/2002   EF 48%, NO EVIDENCE OF ISCHEMIA   CARDIOVERSION  01/2012; 03/31/2012   CARDIOVERSION N/A 02/25/2012   Procedure: CARDIOVERSION;  Surgeon: [name], MD;  Location: [facility] CATH LAB;  Service: Cardiovascular;  Laterality: N/A;   CARDIOVERSION N/A 03/31/2012   Procedure: CARDIOVERSION;  Surgeon: [name], MD;  Location: [facility] CATH LAB;  Service: Cardiovascular;  Laterality: N/A;   CARDIOVERSION N/A 11/23/2015   Procedure: CARDIOVERSION;  Surgeon: Laurey Morale, MD;  Location: [facility] ENDOSCOPY;  Service: Cardiovascular;  Laterality: N/A;   CARDIOVERSION N/A 04/08/2016   Procedure: CARDIOVERSION;  Surgeon: Dolores Patty, MD;  Location: [facility] ENDOSCOPY;  Service: Cardiovascular;  Laterality: N/A;   CARDIOVERSION N/A 09/14/2018   Procedure: CARDIOVERSION;  Surgeon: Wendall Stade, MD;  Location: [facility] ENDOSCOPY;  Service: Cardiovascular;  Laterality: N/A;   CARDIOVERSION N/A 10/19/2019   Procedure: CARDIOVERSION;  Surgeon: Dolores Patty, MD;  Location: [facility] ENDOSCOPY;  Service: Cardiovascular;  Laterality: N/A;   CARDIOVERSION N/A 02/25/2020   Procedure: CARDIOVERSION;  Surgeon: Pricilla Riffle, MD;  Location: [facility] ENDOSCOPY;  Service: Cardiovascular;  Laterality: N/A;   CARDIOVERSION N/A 05/25/2020   Procedure: CARDIOVERSION;  Surgeon: Lewayne Bunting, MD;  Location: [facility] ENDOSCOPY;  Service: Cardiovascular;  Laterality: N/A;   CARDIOVERSION N/A 07/10/2020   Procedure: CARDIOVERSION;  Surgeon: Jake Bathe, MD;  Location: [facility] ENDOSCOPY;  Service: Cardiovascular;  Laterality: N/A;   CLIPPING OF ATRIAL APPENDAGE N/A 08/22/2015   Procedure: CLIPPING OF ATRIAL APPENDAGE;  Surgeon: Kerin Perna, MD;  Location: [facility] OR;  Service: Open Heart Surgery;  Laterality: N/A;   COLONOSCOPY WITH PROPOFOL N/A 01/01/2022   Procedure: COLONOSCOPY WITH PROPOFOL;  Surgeon:  Kathi Der, MD;  Location: [facility] ENDOSCOPY;  Service: Gastroenterology;  Laterality: N/A;   DOPPLER ECHOCARDIOGRAPHY  03/11/2002   EF 70-75%   FINGER SURGERY Left    Middle finger   FINGER TENDON REPAIR  1980's   "right little finger" (03/31/2012)   FLEXIBLE SIGMOIDOSCOPY N/A 12/30/2021   Procedure: FLEXIBLE SIGMOIDOSCOPY;  Surgeon: Charlott Rakes, MD;  Location: [facility] ENDOSCOPY;  Service: Gastroenterology;  Laterality: N/A;   INSERT / REPLACE / REMOVE PACEMAKER     St Jude   IR RADIOLOGIST EVAL & MGMT  05/06/2019   IR RADIOLOGIST EVAL & MGMT  10/06/2019   IR RADIOLOGIST EVAL & MGMT  10/31/2020   IR RADIOLOGIST EVAL & MGMT  01/18/2021   IR RADIOLOGIST EVAL & MGMT  03/08/2021   IR RADIOLOGIST EVAL & MGMT  06/13/2021   KIDNEY SURGERY Left    LEFT AND RIGHT HEART CATHETERIZATION WITH CORONARY ANGIOGRAM N/A 10/27/2012   Procedure: LEFT AND RIGHT HEART CATHETERIZATION WITH CORONARY ANGIOGRAM;  Surgeon: Pamella Pert, MD;  Location: [facility] CATH LAB;  Service: Cardiovascular;  Laterality: N/A;   LUMBAR DISC SURGERY  1980's X2;  2000's   MAZE N/A 08/22/2015  Procedure: MAZE;  Surgeon: Kerin Perna, MD;  Location: [facility] OR;  Service: Open Heart Surgery;  Laterality: N/A;   PACEMAKER INSERTION  03/31/2012   STJ Accent DR pacemaker implanted by Dr Johney Frame   PERMANENT PACEMAKER INSERTION N/A 03/31/2012   Procedure: PERMANENT PACEMAKER INSERTION;  Surgeon: [name], MD;  Location: [facility] CATH LAB;  Service: Cardiovascular;  Laterality: N/A;   PPM GENERATOR CHANGEOUT N/A 08/08/2020   Procedure: PPM GENERATOR CHANGEOUT;  Surgeon: [name], MD;  Location: [facility] INVASIVE CV LAB;  Service: Cardiovascular;  Laterality: N/A;   RADIOLOGY WITH ANESTHESIA Left 02/14/2021   Procedure: RADIOLOGY WITH ANESTHESIA LEFT RENAL CRYOABLATION;  Surgeon: Irish Lack, MD;  Location: [facility] ORS;  Service: Radiology;  Laterality: Left;   STUMP REVISION Right 10/09/2022   Procedure: REVISION RIGHT BELOW KNEE AMPUTATION;   Surgeon: [name], MD;  Location: [facility] OR;  Service: Orthopedics;  Laterality: Right;   STUMP REVISION Right 11/16/2022   Procedure: REVISION RIGHT BELOW KNEE AMPUTATION;  Surgeon: [name], MD;  Location: [facility] OR;  Service: Orthopedics;  Laterality: Right;   TEE WITHOUT CARDIOVERSION  01/30/2012   Procedure: TRANSESOPHAGEAL ECHOCARDIOGRAM (TEE);  Surgeon: Laurey Morale, MD;  Location: [facility] ENDOSCOPY;  Service: Cardiovascular;  Laterality: N/A;  ablation next day   TEE WITHOUT CARDIOVERSION N/A 08/22/2015   Procedure: TRANSESOPHAGEAL ECHOCARDIOGRAM (TEE);  Surgeon: Kerin Perna, MD;  Location: [facility] OR;  Service: Open Heart Surgery;  Laterality: N/A;   TEE WITHOUT CARDIOVERSION N/A 10/19/2019   Procedure: TRANSESOPHAGEAL ECHOCARDIOGRAM (TEE);  Surgeon: Dolores Patty, MD;  Location: [facility] ENDOSCOPY;  Service: Cardiovascular;  Laterality: N/A;   US ECHOCARDIOGRAPHY  08/07/2009   EF 55-60%    Prior to Admission medications   Medication Sig Start Date End Date Taking? Authorizing Provider  albuterol (PROVENTIL HFA;VENTOLIN HFA) 108 (90 Base) MCG/ACT inhaler Inhale 2 puffs into the lungs every 6 (six) hours as needed for wheezing or shortness of breath. 12/28/15  Yes Leslye Peer, MD  amLODipine (NORVASC) 5 MG tablet Take 5 mg by mouth in the morning.   Yes [provider]  apixaban (ELIQUIS) 5 MG TABS tablet Take 1 tablet (5 mg total) by mouth 2 (two) times daily. 08/21/22  Yes Graciella Freer, PA-C  bisoprolol (ZEBETA) 5 MG tablet Take 1 tablet (5 mg total) by mouth daily. Please call office to arrange follow up appointment 11/29/22  Yes Bensimhon, Bevelyn Buckles, MD  Budeson-Glycopyrrol-Formoterol (BREZTRI AEROSPHERE) 160-9-4.8 MCG/ACT AERO Inhale 2 puffs into the lungs in the morning and at bedtime. 05/23/22  Yes Leslye Peer, MD  Cholecalciferol (VITAMIN D) 50 MCG (2000 UT) tablet Take 8,000 Units by mouth daily.   Yes [provider]  Dextromethorphan-guaiFENesin  (MUCINEX DM MAXIMUM STRENGTH) 60-1200 MG TB12 Take 1 tablet by mouth daily.   Yes [provider]  empagliflozin (JARDIANCE) 10 MG TABS tablet Take 1 tablet (10 mg total) by mouth daily. 05/27/22  Yes Bensimhon, Bevelyn Buckles, MD  gabapentin (NEURONTIN) 300 MG capsule Take 2 capsules (600 mg total) by mouth 3 (three) times daily. for neuropathy 10/19/22  Yes Rai, Ripudeep K, MD  hydrOXYzine (ATARAX) 10 MG tablet TAKE 1 TABLET(10 MG) BY MOUTH THREE TIMES DAILY AS NEEDED 12/04/22  Yes [name], MD  levothyroxine (SYNTHROID, LEVOTHROID) 175 MCG tablet Take 175 mcg by mouth daily before breakfast.   Yes [provider]  Menthol-Camphor (TIGER BALM ARTHRITIS RUB EX) Apply 1 application topically as needed (for leg pain).  Yes [provider]  montelukast (SINGULAIR) 10 MG tablet TAKE 1 TABLET(10 MG) BY MOUTH AT BEDTIME Patient taking differently: Take 10 mg by mouth daily. 06/27/22  Yes Leslye Peer, MD  Multiple Minerals-Vitamins (CAL-MAG-ZINC-D) TABS Take 1 tablet by mouth daily.   Yes [provider]  Multiple Vitamin (MULTIVITAMIN WITH MINERALS) TABS tablet Take 1 tablet by mouth daily. 12/15/19  Yes Sheikh, Omair Latif, DO  ondansetron (ZOFRAN) 4 MG tablet Take 1 tablet (4 mg total) by mouth every 8 (eight) hours as needed for nausea or vomiting. 10/19/22  Yes Rai, Ripudeep K, MD  oxyCODONE-acetaminophen (PERCOCET/ROXICET) 5-325 MG tablet Take 1 tablet by mouth every 8 (eight) hours as needed for moderate pain. 12/16/22  Yes [name], MD  OXYGEN Inhale 2 L/min into the lungs See admin instructions. 2 L/min at bedtime and 2 L/min throughout the day as needed for shortness of breath   Yes [provider]  pantoprazole (PROTONIX) 40 MG tablet Take 40 mg by mouth daily before breakfast.   Yes [provider]  Polyethyl Glyc-Propyl Glyc PF (SYSTANE ULTRA PF) 0.4-0.3 % SOLN Place 1 drop into both eyes 2 (two) times daily as needed (dry eyes).   Yes  [provider]  polyethylene glycol (MIRALAX / GLYCOLAX) 17 g packet Take 17 g by mouth 2 (two) times daily as needed. 12/19/22  Yes Pricilla Loveless, MD  potassium chloride SA (KLOR-CON M) 20 MEQ tablet Take 20 mEq by mouth See admin instructions. Take 20 meq twice daily every other day   Yes [provider]  predniSONE (DELTASONE) 2.5 MG tablet TAKE 1 TABLET(2.5 MG) BY MOUTH DAILY WITH BREAKFAST Patient taking differently: Take 2.5 mg by mouth daily with breakfast. 11/29/22  Yes Byrum, Les Pou, MD  rOPINIRole (REQUIP) 1 MG tablet Take 1 tablet (1 mg total) by mouth in the morning and at bedtime. 01/23/20  Yes Glade Lloyd, MD  rosuvastatin (CRESTOR) 10 MG tablet Take 10 mg by mouth at bedtime.  08/08/16  Yes [provider]  senna-docusate (SENOKOT-S) 8.6-50 MG tablet Take 1 tablet by mouth 2 (two) times daily.   Yes [provider]  spironolactone (ALDACTONE) 25 MG tablet Take 1 tablet (25 mg total) by mouth daily. 02/16/16  Yes Graciella Freer, PA-C  sulfamethoxazole-trimethoprim (BACTRIM DS) 800-160 MG tablet Take 1 tablet by mouth 2 (two) times daily. 12/05/22  Yes [name], MD  torsemide (DEMADEX) 10 MG tablet Take 15 mg by mouth every other day.   Yes [provider]  traZODone (DESYREL) 50 MG tablet Take 50 mg by mouth at bedtime. 08/30/20  Yes [provider]    Scheduled Meds:  amLODipine  5 mg Oral q AM   apixaban  5 mg Oral BID   bisoprolol  5 mg Oral Daily   fluticasone furoate-vilanterol  1 puff Inhalation Daily   And   umeclidinium bromide  1 puff Inhalation Daily   gabapentin  300 mg Oral TID   [START ON 12/23/2022] levothyroxine  175 mcg Oral QAC breakfast   montelukast  10 mg Oral Daily   [START ON 12/23/2022] pantoprazole  40 mg Oral QAC breakfast   polyethylene glycol  17 g Oral Daily   [START ON 12/23/2022] predniSONE  2.5 mg Oral Q breakfast   rOPINIRole  1 mg Oral BID   rosuvastatin  10 mg Oral QHS    sodium phosphate  1 enema Rectal Once   Continuous Infusions:  acetaminophen Stopped (  12/22/22 1244)   lactated ringers 100 mL/hr at 12/22/22 1254   methocarbamol (ROBAXIN) IV     PRN Meds:.albuterol, methocarbamol (ROBAXIN) IV, prochlorperazine  Allergies as of 12/22/2022 - Review Complete 12/22/2022  Allergen Reaction Noted   Cephalexin  12/05/2022   Meloxicam Rash and Other (See Comments) 01/31/2012   Vancomycin Other (See Comments) 09/02/2015    Family History  Problem Relation Age of Onset   Heart failure Mother    Stroke Father    Neuropathy Neg Hx     Social History   Socioeconomic History   Marital status: Married    Spouse name: Not on file   Number of children: Not on file   Years of education: Not on file   Highest education level: Not on file  Occupational History   Occupation: retired  Tobacco Use   Smoking status: Former    Current packs/day: 0.00    Average packs/day: 1 pack/day for 30.2 years (30.2 ttl pk-yrs)    Types: Cigarettes    Start date: 1963    Quit date: 07/18/1991    Years since quitting: 31.4    Passive exposure: Never   Smokeless tobacco: Never  Vaping Use   Vaping status: Never Used  Substance and Sexual Activity   Alcohol use: Not Currently    Comment: occasional   Drug use: Yes    Types: Marijuana    Comment: Marijuana- for nausea   Sexual activity: Not Currently  Other Topics Concern   Not on file  Social History Narrative   Lives in [place] with spouse.  Works as a Armed forces training and education officer.   Social Determinants of Health   Financial Resource Strain: Not on file  Food Insecurity: No Food Insecurity (12/22/2022)   Hunger Vital Sign    Worried About Running Out of Food in the Last Year: Never true    Ran Out of Food in the Last Year: Never true  Transportation Needs: No Transportation Needs (12/22/2022)   PRAPARE - Administrator, Civil Service (Medical): No    Lack of Transportation (Non-Medical): No   Physical Activity: Not on file  Stress: Not on file  Social Connections: Not on file  Intimate Partner Violence: Not At Risk (12/22/2022)   Humiliation, Afraid, Rape, and Kick questionnaire    Fear of Current or Ex-Partner: No    Emotionally Abused: No    Physically Abused: No    Sexually Abused: No    Review of Systems: All negative except as stated above in HPI.  Physical Exam: Vital signs: Vitals:   12/22/22 1221 12/22/22 1259  BP:  115/64  Pulse:  65  Resp:    Temp: 99 F (37.2 C) 98.1 F (36.7 C)  SpO2:  99%  R 16  Last BM Date :  (patient doesn't remember) General: Chronically ill-appearing, elderly, lethargic, no acute distress    Head: normocephalic, atraumatic Eyes: anicteric sclera ENT: oropharynx clear Neck: supple, nontender Lungs:  Clear throughout to auscultation.   No wheezes, crackles, or rhonchi. No acute distress. Heart:  Regular rate and rhythm; no murmurs, clicks, rubs,  or gallops. Abdomen: diffuse tenderness with guarding, mild distention, soft, +BS  Rectal:  Deferred Ext: left - no edema; right - BKA  GI:  Lab Results: Recent Labs    12/22/22 0757 12/22/22 0807  WBC 14.5*  --   HGB 13.3 15.6  HCT 43.9 46.0  PLT 299  --    BMET Recent Labs  12/22/22 0757 12/22/22 0807  NA 137 133*  K 3.6 3.8  CL 99 110  CO2 20*  --   GLUCOSE 136* 135*  BUN 22 26*  CREATININE 1.94* 1.90*  CALCIUM 10.1  --    LFT Recent Labs    12/22/22 0757  PROT 8.7*  ALBUMIN 4.7  AST 37  ALT 20  ALKPHOS 69  BILITOT 1.0   PT/INR No results for input(s): "LABPROT", "INR" in the last 72 hours.   Studies/Results: CT ABDOMEN PELVIS WO CONTRAST  Result Date: 12/22/2022 CLINICAL DATA:  Acute abdominal pain.  Nausea. EXAM: CT ABDOMEN AND PELVIS WITHOUT CONTRAST TECHNIQUE: Multidetector CT imaging of the abdomen and pelvis was performed following the standard protocol without IV contrast. RADIATION DOSE REDUCTION: This exam was performed according to  the departmental dose-optimization program which includes automated exposure control, adjustment of the mA and/or kV according to patient size and/or use of iterative reconstruction technique. COMPARISON:  12/19/2022 FINDINGS: Lower chest: No acute findings. Hepatobiliary: No mass visualized on this unenhanced exam. Gallstones are seen, however there is no evidence of cholecystitis or biliary dilatation. Pancreas: No mass or inflammatory process visualized on this unenhanced exam. Spleen:  Within normal limits in size. Adrenals/Urinary tract: Moderate right renal atrophy and bilateral renal parenchymal scarring. No evidence of urolithiasis or hydronephrosis. Unremarkable unopacified urinary bladder. Stomach/Bowel: Normal appendix visualized. Large stool burden seen extending through the sigmoid colon, increased since previous study. A transition point is seen in the mid sigmoid colon, without definite No evidence of wall thickening or inflammatory change this site. This may be due to recent defecation, however stricture in the sigmoid colon cannot definitely be excluded. No evidence of inflammatory process or abnormal fluid collections. Vascular/Lymphatic: No pathologically enlarged lymph nodes identified. No evidence of abdominal aortic aneurysm. Reproductive:  No mass or other significant abnormality. Other: Small umbilical hernia, which contains only fat. Small left inguinal hernia, which contains only fat. Musculoskeletal:  No suspicious bone lesions identified. IMPRESSION: Large stool burden with transition point in the mid sigmoid colon. No evidence of wall thickening or inflammatory change. This may be due to recent defecation/evacuation, however stricture in the sigmoid colon cannot definitely be excluded. Cholelithiasis. No radiographic evidence of cholecystitis. Small umbilical and left inguinal hernias, which contain only fat. Electronically Signed   By: Danae Orleans M.D.   On: 12/22/2022 09:37     Impression/Plan: Severe constipation likely causing abdominal pain. Sigmoid finding is chronic and I do not think he has a sigmoid stricture or large bowel obstruction. Colonoscopy last year negative for sigmoid inflammation and I do not think a repeat colonoscopy or sigmoidoscopy is needed at this time. Needs aggressive enemas to get bowels moving and doubt he could tolerate an oral bowel prep until he starts moving his bowels. Fleets enema ordered by ER but not given yet. Nurse will give this afternoon. Ordered soap suds enema to be given tonight. May need additional enemas tomorrow but defer to Dr. Lorenso Quarry who will f/u for our group tomorrow. Clear liquid diet. Supportive care.    LOS: 0 days   Shirley Friar  12/22/2022, 4:13 PM  Questions please call [phone]

## 2022-12-22 NOTE — H&P (Addendum)
Hospital Admission History and Physical Service Pager: [phone]  Patient name: [name] Medical record number: [id number] Date of Birth: 11-Jun-1950 Age: 72 y.o. Gender: male  Primary Care Provider: Alysia Penna, MD Consultants: GI Code Status: DNR, confirmed with patient   Preferred Emergency Contact:  Contact Information     Name Relation Home Work [place] B Spouse [phone]  [phone]      Other Contacts   None on File    Chief Complaint: abdominal pain and constipation   Assessment and Plan: NEYO WILLMARTH is a 72 y.o. male w/ PMH of chronic opioid use, DM2, hypothyroidism, presenting with 3 days of generalized abdominal pain and nausea found to have large stool burden and failed outpatient treatment. Likely multifactorial causes of constipation, included medication side effect, bowel obstruction, anatomical defect, and uncontrolled hypothyroidism. Most likely cause is medication side effect from chronic opioid use, which more recently increased in the setting of recent BKA and revisions. Also possible cause is sigmoid stricture causing obstruction, as patient had sigmoid thickening on colonoscopy 12/2021 and CTAP showed possible stricture in sigmoid colon. Furthermore, patient has inconsistent history of taking synthroid and has reported symptoms of uncontrolled hypothyroidism, which could be exacerbating his constipation. Other diagnoses considered include mesenteric ischemia, diverticulosis, pancreatitis, biliary pathology. Patient hemodynamically stable and without peritonitis, making emergent, acute abdominal pathologies less likely.   Assessment & Plan Constipation Last bowel movement approximately 1 week ago. Has been having abdominal pain and nausea for the past 2 weeks that worsening in the last few days. Has increased oxycodone from two 5mg  tablets daily to 3-4 5mg  tablets daily over the past 4-6 weeks. Has also not been taking home  medications, including synthroid, recently due to nausea. CTAP with large stool burden, possible stricture in sigmoid colon.  -admit to FMTS, med-surg, attending Dr Linwood Dibbles -Fleet enema -Tap water enema PRN  -Compazine q6hr PRN (due to prolonged QT) -avoid opioids for pain control -IV tylenol -IV robaxin  -home gabapentin 600mg  BID -IVF LR 155mL/hr -clear liquid diet Acute on chronic diastolic (congestive) heart failure (HCC) EF 55-60% Home regimen: bisoprolol 5 mg, spironolactone 25 mg, torsemide 10mg , jardiance 10 mg  - Hold torsemide, spironolactone, and jardiance in the setting of AKI  - AM BMP   AKI (acute kidney injury) (HCC) Pt with CKD3, baseline Cr appears to be around 1.5. Cr on admission 1.95. -IVF as above -AM BMP  Chronic and Stable Conditions: COPD: Scheduled breztri, singulair, prednisone 2.5mg  daily, albuterol prn Afib, AV replacement: eliquis 5mg  BID DM II: not on insulin, will order carb modified diet when tolerating PO HTN; amlodipine 5mg  HLD: Continue restor 10 mg  Restless leg syndrome: ropinirole 1mg  BID  FEN/GI: liquid diet VTE Prophylaxis: on eliquis  Disposition: med-surg, attending Dr Linwood Dibbles  History of Present Illness:  [name] is a 72 y.o. male presenting with abdominal pain and nausea.   Patient has been feeling poorly for about 2 weeks. He has been constipated and has been having abdominal pain. Says that his last bowel movement was a week ago. He came to the ED on Thursday due to abdominal pain and constipation as well. Was discharged on miralax. He has not been eating or drinking much for a week due to abdominal pain and nausea, so he was not able to drink miralax.   Patient has been on a lot of pain medication recently as he had an amputation a couple months ago. He has  been taking 4 5 mg oxycodone for the last month and was on 1-2 oxycodone a day before that.   Colonoscopy 9/23, only significant for thickening of the colon.   Has  not been taking eliquis due to nausea. Did take one yesterday. Has not been taking any other medications (thyroid medication)   In the ED, patient was hemodynamically stable. However, his Cr was elevated from baseline. His lactic acid was 3. He got reglan, morphine, benedryl, and 1L NS bolus.  Review Of Systems: Per HPI with the following additions:   Pertinent Past Medical History: PAF  Pacemaker  Aortic valve replacement for AS  COPD oxygen at night  Hypothyroidism  DM2 CKD3  HFpEF Umbilical hernia no surgery  Remainder reviewed in history tab.   Pertinent Past Surgical History: BKA, 2 revisions   Colonoscopy 12/2021 Remainder reviewed in history tab.   Pertinent Social History: Tobacco use: Former quit years ago 1ppd for 15 years  Alcohol use: in a blue moon Other Substance use: marijuana for nausea Lives with Wife  Pertinent Family History: No history of Colon cancer  Remainder reviewed in history tab.   Important Outpatient Medications: Eliquis, Synthryoid, jardiance, spironolactone, torsemide, trazodone, oxycodone 5 mg q 6 hrs  Remainder reviewed in medication history.   Objective: BP (!) 147/86   Pulse 60   Temp 98 F (36.7 C) (Oral)   Resp 13   Ht 5\' 11"  (1.803 m)   Wt 88.5 kg   SpO2 100%   BMI 27.20 kg/m  Exam: General: elderly male, laying in bed in moderate discomfort, answering questions appropriately Cardiovascular: RRR, normal S1/S2, no murmurs, rubs, or gallops Respiratory: CTAB, inspiratory wheezes Gastrointestinal: tender to mild palpation across lower abdomen, L>R, moderately distended, decreased bowel sounds Ext: R BKA, no edema to LLE Psych: appropriate mood and affect  Labs:  CBC BMET  Recent Labs  Lab 12/22/22 0757 12/22/22 0807  WBC 14.5*  --   HGB 13.3 15.6  HCT 43.9 46.0  PLT 299  --    Recent Labs  Lab 12/22/22 0757 12/22/22 0807  NA 137 133*  K 3.6 3.8  CL 99 110  CO2 20*  --   BUN 22 26*  CREATININE 1.94* 1.90*   GLUCOSE 136* 135*  CALCIUM 10.1  --     Lactic acid 3.6 > 2.0    EKG: none   Imaging Studies Performed: CT abdomen pelvis Large stool burden with transition point at mid-sigmoid colon. No wall thickening or inflammatory change. Cannot exclude stricture.  Cholelithiasis, no cholecystitis. Small umbilical and L inguinal hernias that contain fat.    Lorayne Bender, MD 12/22/2022, 10:22 AM PGY-1, [facility]  FPTS Intern pager: [phone], text pages welcome Secure chat group [facility] Teaching Service     Upper Level Attestation I have seen and examined the patient with the resident Dr. Dolan Amen. I agree with the history, physical, and assessment above with any necessary edits.   Lockie Mola, MD  Family Medicine Teaching Service

## 2022-12-22 NOTE — Plan of Care (Signed)
Patient admitted to 2W. Alert and oriented x4. IV fluids started. Vitals taken.

## 2022-12-22 NOTE — Progress Notes (Addendum)
FMTS Brief Progress Note  S: [name] is a 72 y.o. male with a history of hypothyroidism, T2DM, and several months of opiate intake s/p R BKA, now presenting with abdominal pain and nausea, admitted for fecal impaction.  Saw patient at bedside with Dr. Marisue Humble.  Reports small BM, did not see appearance, but stomach pain lessened.  Reports he feels "much better" than when he came in.  Notes his leg hurts less as well.  Ate 100% of evening clear liquid meal.  Denies nausea, vomiting.  Pretty comfortable.  O: BP 97/68 (BP Location: Right Arm)   Pulse 64   Temp 98.3 F (36.8 C) (Oral)   Resp 16   Ht 5\' 11"  (1.803 m)   Wt 74.7 kg   SpO2 100%   BMI 22.97 kg/m   General: Age-appropriate, resting comfortably in bed, NAD, alert and at baseline. HEENT: MMM. Cardiovascular: Regular rate and rhythm. Normal S1/S2. No murmurs, rubs, or gallops appreciated. 2+ radial pulses. Pulmonary: No increased WOB, no accessory muscle usage on room air. Abdominal: Moderate tenderness to light palpation.  No rebound or guarding.  Mildly distended abdomen. Skin: Warm and dry.  No rashes grossly. Extremities: R BKA. No peripheral edema in LLE.  Capillary refill <2 seconds.   A/P: Appears to be improving.  Given severely elevated TSH to 84 and T4 <0.25, suspect hypothyroidism significantly contributing to constipation, which is further exacerbated by opiate use.  Patient would benefit from tapering or discontinuing opiate dose at discharge given several months passing since his BKA and now this new sequela of constipation.  Monitoring BP with notable decrease since admission, but MAP still above 65, suspect 2/2 volume loss following Fleet enema. Assessment & Plan Constipation Last bowel movement approximately 1 week ago.  Abdominal pain improved following small BM and tolerating PO well. -Soap suds enema this PM -Compazine Q6h PRN, has not needed; use caution with prolonged QT -Clear liquid diet, advance  as tolerated and per GI recs -Avoiding opioids for pain control, can give one-time does PRN if severe pain -IV tylenol -IV robaxin -Home gabapentin 600mg  BID AKI (acute kidney injury) (HCC) Pt with CKD3, baseline Cr appears to be around 1.5. Cr on admission 1.95, improved to 1.90 on recheck later in day.  S/p ~1L of mIVF at 100 mL/hr LR x 10 hrs. -d/c IVF as tolerating PO intake well and has CHF -f/u AM BMP Acute on chronic diastolic (congestive) heart failure (HCC) EF 55-60% Home regimen: bisoprolol 5 mg, spironolactone 25 mg, torsemide 10mg , jardiance 10 mg -Continuing to hold torsemide, spironolactone, and jardiance in the setting of AKI -f/u AM BMP  - Orders reviewed. Labs for AM ordered, which were adjusted as needed - If condition changes, plan includes one time oxycodone dose for pain PRN, but attempting to minimize opiates.  Deloria Brassfield, MD 12/22/2022, 7:43 PM PGY-1, [facility] Night Resident  Please page [phone] with questions.

## 2022-12-22 NOTE — Assessment & Plan Note (Signed)
Pt with CKD3, baseline Cr appears to be around 1.5. Cr on admission 1.95, improved to 1.90 on recheck later in day.  S/p ~1L of mIVF at 100 mL/hr LR x 10 hrs. -d/c IVF as tolerating PO intake well and has CHF -f/u AM BMP

## 2022-12-22 NOTE — Assessment & Plan Note (Signed)
Pt with CKD3, baseline Cr appears to be around 1.5. Cr on admission 1.95. -IVF as above -AM BMP

## 2022-12-22 NOTE — Assessment & Plan Note (Signed)
Last bowel movement approximately 1 week ago. Has been having abdominal pain and nausea for the past 2 weeks that worsening in the last few days. Has increased oxycodone from two 5mg  tablets daily to 3-4 5mg  tablets daily over the past 4-6 weeks. Has also not been taking home medications, including synthroid, recently due to nausea. CTAP with large stool burden, possible stricture in sigmoid colon.  -admit to FMTS, med-surg, attending Dr Linwood Dibbles -Fleet enema -Tap water enema PRN  -Compazine q6hr PRN (due to prolonged QT) -avoid opioids for pain control -IV tylenol -IV robaxin  -home gabapentin 600mg  BID -IVF LR 126mL/hr -clear liquid diet

## 2022-12-22 NOTE — Hospital Course (Signed)
[name] is a 72 y.o.male with a history of R BKA, DMII, CKD3, hypothyroidism, paroxysmal Afib, and AV replacement who was admitted to the [facility] Medicine Teaching Service at [facility] for generalized abdominal pain. His hospital course is detailed below:   Constipation Presented with 2 weeks of abdominal pain, nausea, and constipation. CTAP showed large stool burden with possible stricture at mid sigmoid colon. Patient with recent colonoscopy showing thickening in sigmoid region. Patient with chronic opioid use and more recent doubling of opioid use secondary to recent BKA and revisions. Also most likely contributing, TSH markedly elevated at 84.5 and free T4 <0.25. Patient had been compliant on medication. Did throw up medication including synthroid, or not take due to nausea for a week; however, unlikely to cause that much of an abnormality. Unsure what caused such abnormal thyroid level. He was treated with enemas, miralax bowel prep. Finally started to slowly have bowel movements on 8/25. Abdominal pain had resolved by discharge. Patient discharged without any opioids and with encouragement to continue miralax. GI evaluation resulted that patient does not have significant sigmoid stricture. Does not need to be followed up with another colonoscopy, but will follow up outpatinet.   AKI on CKD  Cr 1.95 on admission from baseline around 1.5. Improved with IVF down to 1.77. Continue to monitor creatinine and hold nephrotoxic drugs  Chronic pain Pain from BKA performed in May in addition to revisions. Pain regimen adjusted while inpatient. Avoided opioids due to constipation. Discharged with 600 mg gabapentin TID and prn ibuprofen and  tylenol.   Other chronic conditions were medically managed with home medications and formulary alternatives as necessary (CKD3, R BKA, HTN, HLD, COPD, T2DM, A-fib w/ AV replacement)  PCP Follow-up Recommendations: Recommend endocrinology referral and thyroid ultrasound  as TSH found to be significantly elevated despite reported adherence with synthroid.  Consider other modalities for phantom pain of BKA such as cymbalta if patient continues to have pain. Avoid opioids.  F/u GI appointment.

## 2022-12-22 NOTE — Assessment & Plan Note (Deleted)
Pt with CKD3, baseline Cr appears to be around 1.5. Cr on admission 1.95. -IVF as above -AM BMP

## 2022-12-23 DIAGNOSIS — N179 Acute kidney failure, unspecified: Secondary | ICD-10-CM

## 2022-12-23 DIAGNOSIS — K5900 Constipation, unspecified: Secondary | ICD-10-CM

## 2022-12-23 DIAGNOSIS — Z89511 Acquired absence of right leg below knee: Secondary | ICD-10-CM | POA: Diagnosis not present

## 2022-12-23 LAB — BASIC METABOLIC PANEL
Anion gap: 10 (ref 5–15)
BUN: 21 mg/dL (ref 8–23)
CO2: 27 mmol/L (ref 22–32)
Calcium: 8.6 mg/dL — ABNORMAL LOW (ref 8.9–10.3)
Chloride: 98 mmol/L (ref 98–111)
Creatinine, Ser: 1.77 mg/dL — ABNORMAL HIGH (ref 0.61–1.24)
GFR, Estimated: 40 mL/min — ABNORMAL LOW (ref >60)
Glucose, Bld: 88 mg/dL (ref 70–99)
Potassium: 3.6 mmol/L (ref 3.5–5.1)
Sodium: 135 mmol/L (ref 135–145)

## 2022-12-23 LAB — MAGNESIUM: Magnesium: 2.4 mg/dL (ref 1.7–2.4)

## 2022-12-23 MED ORDER — ACETAMINOPHEN 325 MG PO TABS
650.0000 mg | ORAL_TABLET | Freq: Four times a day (QID) | ORAL | Status: DC
  Administered 2022-12-23 – 2022-12-24 (×5): 650 mg via ORAL
  Filled 2022-12-23 (×5): qty 2

## 2022-12-23 MED ORDER — POLYETHYLENE GLYCOL 3350 17 G PO PACK: 17.0000 g | PACK | Freq: Two times a day (BID) | ORAL | Status: DC

## 2022-12-23 MED ORDER — POLYETHYLENE GLYCOL 3350 17 GM/SCOOP PO POWD
1.0000 | Freq: Once | ORAL | Status: AC
  Administered 2022-12-23: 255 g via ORAL
  Filled 2022-12-23: qty 255

## 2022-12-23 NOTE — Assessment & Plan Note (Addendum)
TSH of 84.537, Free T4 <0.25. Hypothyroidism likely contributing to constipation. Currently taking Levothyroxine 175 mcg daily before breakfast. - Considered increase Levo dose  - Endocrinology follow - up - Outpatient thyroid US considered due to hx of hyperthyroidism and rapid increase in worsened hypothyroidism and possible parathyroid involvement with low ionized calcium

## 2022-12-23 NOTE — Progress Notes (Addendum)
Daily Progress Note Intern Pager: [phone]  Patient name: [name] Medical record number: [id number] Date of birth: 03-26-1951 Age: 72 y.o. Gender: male  Primary Care Provider: Alysia Penna, MD Consultants: Gastroenterology  Code Status: DNR  Pt Overview and Major Events to Date:  8/25: Patient admitted  Assessment and Plan: Pt found to have elevated TSH to 84 and T4 <0.25, suspect hypothyroidism significantly contributing to constipation, which is further exacerbated by chronic opiate use. Attempting to taper/ discontinue opiate use by discharge date given several months passing since his BKA and sequela of constipation. Past medical hx significant for CHF, COPD, right sided BKA, HTN, HLD, a-fib w/ stent placement on eliquis, CKD3. Assessment & Plan Constipation Last bowel movement prior to admission was approximately 1 week ago.  Abdominal pain improved following two small bowel movements last night (8/25), currently tolerating PO well. -Ordered go-lightly miralax prep to aid in bowel cleanse -Compazine Q6h PRN, Use caution with prolonged QT -Continue clear liquid diet as tolerated per GI recommendation  -Avoiding opioids for pain control, can give one-time does PRN if severe pain -PO 650 mg tylenol scheduled  -Home gabapentin 600mg  BID AKI (acute kidney injury) (HCC) Pt with CKD3, baseline Cr appears to be around 1.5. Cr on admission 1.95, improved to 1.77 on recheck.   -Ordered BMP to monitor creatinine  -Continuing to hold torsemide, spironolactone, and jardiance in the setting of AKI Hypothyroidism TSH of 84.537, Free T4 <0.25. Hypothyroidism likely contributing to constipation. Currently taking Levothyroxine 175 mcg daily before breakfast. - Considered increase Levo dose  - Endocrinology follow - up - Outpatient thyroid US considered due to hx of hyperthyroidism and rapid increase in worsened hypothyroidism and possible parathyroid involvement with low ionized  calcium  Chronic and Stable Conditions: COPD: Scheduled breztri, singulair, prednisone 2.5mg  daily, albuterol prn. On 2L  O2 at baseline CHF: EF 55-60% Home regimen: bisoprolol 5 mg, spironolactone 25 mg, torsemide 10mg , jardiance 10 mg. Continuing to hold torsemide, spironolactone, and jardiance in the setting of AKI Afib, AV replacement: eliquis 5mg  BID DM II: not on insulin, will order carb modified diet when tolerating PO HTN; amlodipine 5mg  HLD: Continue Crestor 10 mg  Restless leg syndrome: ropinirole 1mg  BID  FEN/GI: Clear liquid diet, advance as tolerated per GI PPx: On Eliquis Dispo:Pending clinical improvement  Subjective:  [name] was resting comfortably in bed, and reported improved sleep last night. He continued to endorse abdominal pain rated 8/10, but stated that it is improved from 10/10 pain yesterday. He reported two small bowel movements and frequent flatulence over the past 24 hours. He denied worsening SOB or chest pain, and noted that his leg pain had improved slightly.  He endorses improved appetite and was awaiting clear liquid breakfast.  Objective: Temp:  [98 F (36.7 C)-99 F (37.2 C)] 98.7 F (37.1 C) (08/26 0527) Pulse Rate:  [58-65] 63 (08/26 0527) Resp:  [11-19] 17 (08/26 0527) BP: (97-169)/(64-110) 127/74 (08/26 0527) SpO2:  [92 %-100 %] 100 % (08/26 0527) Weight:  [74.7 kg] 74.7 kg (08/25 1310)  Physical Exam: General: Alert and oriented, well-appearing Cardiovascular: Bradycardic. Regular rhythm. Normal S1/S2. No murmurs, rubs, or gallops. 2+ radial pulses. Pulmonary: Lungs clear to auscultation bilaterally. No wheezing, crackles, or rales present Abdominal: Moderate tenderness to light palpation.  No rebound or guarding.  Mildly distended abdomen, primarily on the left side. Skin: Warm and dry.  No rashes grossly. Extremities: R BKA. No peripheral edema in LLE.  Laboratory: Most recent CBC Lab Results  Component Value Date   WBC  14.5 (H) 12/22/2022   HGB 15.6 12/22/2022   HCT 46.0 12/22/2022   MCV 84.3 12/22/2022   PLT 299 12/22/2022   Most recent BMP    Latest Ref Rng & Units 12/22/2022    8:07 AM  BMP  Glucose 70 - 99 mg/dL 295   BUN 8 - 23 mg/dL 26   Creatinine 2.84 - 1.24 mg/dL 1.32   Sodium 440 - 102 mmol/L 133   Potassium 3.5 - 5.1 mmol/L 3.8   Chloride 98 - 111 mmol/L 110     Imaging/Diagnostic Tests: CT w/o contrast: IMPRESSION: Large stool burden with transition point in the mid sigmoid colon. No evidence of wall thickening or inflammatory change. This may be due to recent defecation/evacuation, however stricture in the sigmoid colon cannot definitely be excluded.   Small umbilical and left inguinal hernias, which contain only fat.  Dayle Points, Medical Student 12/23/2022, 7:30 AM  [facility] Family Medicine FPTS Intern pager: [phone], text pages welcome Secure chat group [facility] Teaching Service

## 2022-12-23 NOTE — Consult Note (Signed)
WOC Nurse Consult Note: WOC consult for wound dehiscence R stump; patient had revision of R BKA 11/16/2022 by Dr. Lajoyce Corners; last seen in follow-up by Dr. Lajoyce Corners 8/15 and was noted to have dehiscence of incision at that time; no wound care orders noted  just stump shrinker  Reason for Consult: stump dehiscence  Wound type: full thickness surgical  Pressure Injury POA: NA  Measurement: Medial aspect of R stump 2 cm x 5 cm x 0.5 cm 75% pink moist 25% yellow fibrin; anterior aspect 1 cm x 1.5 cm x 0.3 cm 50% pink and moist 50% dry brown eschar   Drainage (amount, consistency, odor) minimal tan exudate on old dressing  Periwound: healing incision  Dressing procedure/placement/frequency: Clean R stump wound with NS, apply silver hydrofiber [place] [id number]) cut to fit wound beds daily and cover with silicone foam.  When wife brings back stump shrinker silicone foam should be removed, silver left in wound bed and stump shrinker placed on stump.    Patient has follow-up appointment with Dr. Lajoyce Corners around 01/09/2023 per Dr. Audrie Lia note and I stressed to patient the importance of wearing his stump shrinker only at home and keeping this follow-up appointment.   POC discussed with patient, bedside nurse and primary MD.   WOC team will not follow at this time. Re-consult if further needs arise.   Thank you,    Priscella Mann MSN, RN-BC, [employer] [phone]

## 2022-12-23 NOTE — Care Management Obs Status (Cosign Needed)
MEDICARE OBSERVATION STATUS NOTIFICATION   Patient Details  Name: [name] MRN: [id number] Date of Birth: 02-15-1951   Medicare Observation Status Notification Given:  Yes    Janae Bridgeman, RN 12/23/2022, 11:08 AM

## 2022-12-23 NOTE — Assessment & Plan Note (Deleted)
EF 55-60% Home regimen: bisoprolol 5 mg, spironolactone 25 mg, torsemide 10mg , jardiance 10 mg -Continuing to hold torsemide, spironolactone, and jardiance in the setting of AKI -Ordered BMP -Continuing baseline 2L O2 [place]

## 2022-12-23 NOTE — Progress Notes (Signed)
Transition of Care [facility]) - Inpatient Brief Assessment   Patient Details  Name: DIETER HUYCK MRN: [id number] Date of Birth: [birth year]  [facility] CM/SW Contact:    Janae Bridgeman, RN Phone Number: 12/23/2022, 11:27 AM   Clinical Narrative: CM met with the patient at the bedside and provided  the patient with Medicare Observation letter and code 44 status was completed.    The patient lives at home with his spouse and has DME at the home that includes home oxygen at night at 2L/min [place], WC, RW and Crutches.  No TOC needs at this time. \ The patient will discharge home with family once medically stable to discharge.   Transition of Care Asessment: Insurance and Status: (P) Insurance coverage has been reviewed Patient has primary care physician: (P) Yes Home environment has been reviewed: (P) Yes - from home with spouse Prior level of function:: (P) Independent Prior/Current Home Services: (P) No current home services Social Determinants of Health Reivew: (P) SDOH reviewed no interventions necessary Readmission risk has been reviewed: (P) Yes Transition of care needs: (P) no transition of care needs at this time

## 2022-12-23 NOTE — Assessment & Plan Note (Addendum)
Pt with CKD3, baseline Cr appears to be around 1.5. Cr on admission 1.95, improved to 1.77 on recheck.   -Ordered BMP to monitor creatinine  -Continuing to hold torsemide, spironolactone, and jardiance in the setting of AKI

## 2022-12-23 NOTE — Assessment & Plan Note (Addendum)
Last bowel movement prior to admission was approximately 1 week ago.  Abdominal pain improved following two small bowel movements last night (8/25), currently tolerating PO well. -Ordered go-lightly miralax prep to aid in bowel cleanse -Compazine Q6h PRN, Use caution with prolonged QT -Continue clear liquid diet as tolerated per GI recommendation  -Avoiding opioids for pain control, can give one-time does PRN if severe pain -PO 650 mg tylenol scheduled  -Home gabapentin 600mg  BID

## 2022-12-23 NOTE — Care Management CC44 (Cosign Needed)
Condition Code 44 Documentation Completed  Patient Details  Name: [name] MRN: [id number] Date of Birth: [birth year]   Condition Code 44 given:  Yes Patient signature on Condition Code 44 notice:  Yes Documentation of 2 MD's agreement:  Yes Code 44 added to claim:  Yes    Janae Bridgeman, RN 12/23/2022, 11:08 AM

## 2022-12-23 NOTE — Progress Notes (Signed)
[facility] Progress Note  SUBJECTIVE:   Interval history: [name] was seen and evaluated today at bedside. Resting comfortably in bed. Noted that he still has some peri-umbilical abdominal discomfort. He has no further nausea/vomiting, he is able to tolerate liquids and is attempting bowel preparation. He had enemas x 2 yesterday with small amount of stool. No chest pain or shortness of breath. Wears supplemental oxygen 2 L nasal cannula at home during sleep.   Past Medical History:  Diagnosis Date   Aortic stenosis, mild    Arthritis of hand    "just a little bit in both hands" (03/31/2012)   Asthma    "little bit" (03/31/2012)   Atrial fibrillation (HCC)    dx '04; DCCV '04, placed on flecainide, failed DCCV 04/2010, flecainide stopped; s/p successful A.fib ablation 01/31/12   CHF (congestive heart failure) (HCC)    Controlled type 2 diabetes mellitus without complication, without long-term current use of insulin (HCC) 12/13/2019   COPD (chronic obstructive pulmonary disease) (HCC)    DJD (degenerative joint disease)    Fatty liver    Fibromyalgia    GERD (gastroesophageal reflux disease)    Hyperlipidemia    Hypertension    Hypothyroidism    S/P radiation   Left atrial enlargement    LA size 45mm by echo 11/21/11   Mitral regurgitation    trivial   Obstructive sleep apnea    mild by sleep study 2013; pt stated he does not have a machine because "it wasn't bad enough for him to have one"   Pacemaker 03/31/2012   Pneumonia    last time 2023   Restless leg syndrome    Second degree AV block    Splenomegaly    Past Surgical History:  Procedure Laterality Date   AMPUTATION Right 08/23/2022   Procedure: RIGHT FOOT FIFTH RAY AMPUTATION;  Surgeon: [name], MD;  Location: [facility] OR;  Service: Orthopedics;  Laterality: Right;   AMPUTATION Right 09/04/2022   Procedure: RIGHT BELOW KNEE AMPUTATION;  Surgeon: [name], MD;  Location: [facility] OR;  Service:  Orthopedics;  Laterality: Right;   AORTIC VALVE REPLACEMENT N/A 08/22/2015   Procedure: AORTIC VALVE REPLACEMENT (AVR);  Surgeon: Kerin Perna, MD;  Location: [facility] OR;  Service: Open Heart Surgery;  Laterality: N/A;   ATRIAL FIBRILLATION ABLATION  01/30/2012   PVI by Dr. Johney Frame   ATRIAL FIBRILLATION ABLATION N/A 01/31/2012   Procedure: ATRIAL FIBRILLATION ABLATION;  Surgeon: [name], MD;  Location: [facility] CATH LAB;  Service: Cardiovascular;  Laterality: N/A;   ATRIAL FIBRILLATION ABLATION N/A 04/19/2020   Procedure: ATRIAL FIBRILLATION ABLATION;  Surgeon: [name], MD;  Location: [facility] INVASIVE CV LAB;  Service: Cardiovascular;  Laterality: N/A;   BACK SURGERY     X 3   BIOPSY  01/01/2022   Procedure: BIOPSY;  Surgeon: Kathi Der, MD;  Location: [facility] ENDOSCOPY;  Service: Gastroenterology;;   CARDIAC CATHETERIZATION N/A 08/09/2015   Procedure: Right/Left Heart Cath and Coronary Angiography;  Surgeon: Peter M Swaziland, MD;  Location: [facility] INVASIVE CV LAB;  Service: Cardiovascular;  Laterality: N/A;   CARDIAC CATHETERIZATION N/A 02/05/2016   Procedure: Right/Left Heart Cath and Coronary Angiography;  Surgeon: Corky Crafts, MD;  Location: [facility] INVASIVE CV LAB;  Service: Cardiovascular;  Laterality: N/A;   CARDIAC CATHETERIZATION N/A 04/17/2016   Procedure: Right Heart Cath;  Surgeon: Dolores Patty, MD;  Location: [facility] INVASIVE CV LAB;  Service: Cardiovascular;  Laterality: N/A;   CARDIOVASCULAR STRESS  TEST  03/12/2002   EF 48%, NO EVIDENCE OF ISCHEMIA   CARDIOVERSION  01/2012; 03/31/2012   CARDIOVERSION N/A 02/25/2012   Procedure: CARDIOVERSION;  Surgeon: [name], MD;  Location: [facility] CATH LAB;  Service: Cardiovascular;  Laterality: N/A;   CARDIOVERSION N/A 03/31/2012   Procedure: CARDIOVERSION;  Surgeon: [name], MD;  Location: [facility] CATH LAB;  Service: Cardiovascular;  Laterality: N/A;   CARDIOVERSION N/A 11/23/2015   Procedure: CARDIOVERSION;  Surgeon: Laurey Morale, MD;   Location: [facility] ENDOSCOPY;  Service: Cardiovascular;  Laterality: N/A;   CARDIOVERSION N/A 04/08/2016   Procedure: CARDIOVERSION;  Surgeon: Dolores Patty, MD;  Location: [facility] ENDOSCOPY;  Service: Cardiovascular;  Laterality: N/A;   CARDIOVERSION N/A 09/14/2018   Procedure: CARDIOVERSION;  Surgeon: Wendall Stade, MD;  Location: [facility] ENDOSCOPY;  Service: Cardiovascular;  Laterality: N/A;   CARDIOVERSION N/A 10/19/2019   Procedure: CARDIOVERSION;  Surgeon: Dolores Patty, MD;  Location: [facility] ENDOSCOPY;  Service: Cardiovascular;  Laterality: N/A;   CARDIOVERSION N/A 02/25/2020   Procedure: CARDIOVERSION;  Surgeon: Pricilla Riffle, MD;  Location: [facility] ENDOSCOPY;  Service: Cardiovascular;  Laterality: N/A;   CARDIOVERSION N/A 05/25/2020   Procedure: CARDIOVERSION;  Surgeon: Lewayne Bunting, MD;  Location: [facility] ENDOSCOPY;  Service: Cardiovascular;  Laterality: N/A;   CARDIOVERSION N/A 07/10/2020   Procedure: CARDIOVERSION;  Surgeon: Jake Bathe, MD;  Location: [facility] ENDOSCOPY;  Service: Cardiovascular;  Laterality: N/A;   CLIPPING OF ATRIAL APPENDAGE N/A 08/22/2015   Procedure: CLIPPING OF ATRIAL APPENDAGE;  Surgeon: Kerin Perna, MD;  Location: [facility] OR;  Service: Open Heart Surgery;  Laterality: N/A;   COLONOSCOPY WITH PROPOFOL N/A 01/01/2022   Procedure: COLONOSCOPY WITH PROPOFOL;  Surgeon: Kathi Der, MD;  Location: [facility] ENDOSCOPY;  Service: Gastroenterology;  Laterality: N/A;   DOPPLER ECHOCARDIOGRAPHY  03/11/2002   EF 70-75%   FINGER SURGERY Left    Middle finger   FINGER TENDON REPAIR  1980's   "right little finger" (03/31/2012)   FLEXIBLE SIGMOIDOSCOPY N/A 12/30/2021   Procedure: FLEXIBLE SIGMOIDOSCOPY;  Surgeon: Charlott Rakes, MD;  Location: [facility] ENDOSCOPY;  Service: Gastroenterology;  Laterality: N/A;   INSERT / REPLACE / REMOVE PACEMAKER     St Jude   IR RADIOLOGIST EVAL & MGMT  05/06/2019   IR RADIOLOGIST EVAL & MGMT  10/06/2019   IR RADIOLOGIST EVAL & MGMT  10/31/2020   IR  RADIOLOGIST EVAL & MGMT  01/18/2021   IR RADIOLOGIST EVAL & MGMT  03/08/2021   IR RADIOLOGIST EVAL & MGMT  06/13/2021   KIDNEY SURGERY Left    LEFT AND RIGHT HEART CATHETERIZATION WITH CORONARY ANGIOGRAM N/A 10/27/2012   Procedure: LEFT AND RIGHT HEART CATHETERIZATION WITH CORONARY ANGIOGRAM;  Surgeon: Pamella Pert, MD;  Location: [facility] CATH LAB;  Service: Cardiovascular;  Laterality: N/A;   LUMBAR DISC SURGERY  1980's X2;  2000's   MAZE N/A 08/22/2015   Procedure: MAZE;  Surgeon: Kerin Perna, MD;  Location: [facility] OR;  Service: Open Heart Surgery;  Laterality: N/A;   PACEMAKER INSERTION  03/31/2012   STJ Accent DR pacemaker implanted by Dr Johney Frame   PERMANENT PACEMAKER INSERTION N/A 03/31/2012   Procedure: PERMANENT PACEMAKER INSERTION;  Surgeon: [name], MD;  Location: [facility] CATH LAB;  Service: Cardiovascular;  Laterality: N/A;   PPM GENERATOR CHANGEOUT N/A 08/08/2020   Procedure: PPM GENERATOR CHANGEOUT;  Surgeon: [name], MD;  Location: [facility] INVASIVE CV LAB;  Service: Cardiovascular;  Laterality: N/A;   RADIOLOGY WITH ANESTHESIA Left 02/14/2021   Procedure:  RADIOLOGY WITH ANESTHESIA LEFT RENAL CRYOABLATION;  Surgeon: Irish Lack, MD;  Location: [facility] ORS;  Service: Radiology;  Laterality: Left;   STUMP REVISION Right 10/09/2022   Procedure: REVISION RIGHT BELOW KNEE AMPUTATION;  Surgeon: [name], MD;  Location: [facility] OR;  Service: Orthopedics;  Laterality: Right;   STUMP REVISION Right 11/16/2022   Procedure: REVISION RIGHT BELOW KNEE AMPUTATION;  Surgeon: [name], MD;  Location: [facility] OR;  Service: Orthopedics;  Laterality: Right;   TEE WITHOUT CARDIOVERSION  01/30/2012   Procedure: TRANSESOPHAGEAL ECHOCARDIOGRAM (TEE);  Surgeon: Laurey Morale, MD;  Location: [facility] ENDOSCOPY;  Service: Cardiovascular;  Laterality: N/A;  ablation next day   TEE WITHOUT CARDIOVERSION N/A 08/22/2015   Procedure: TRANSESOPHAGEAL ECHOCARDIOGRAM (TEE);  Surgeon: Kerin Perna, MD;  Location: [facility]  OR;  Service: Open Heart Surgery;  Laterality: N/A;   TEE WITHOUT CARDIOVERSION N/A 10/19/2019   Procedure: TRANSESOPHAGEAL ECHOCARDIOGRAM (TEE);  Surgeon: Dolores Patty, MD;  Location: [facility] ENDOSCOPY;  Service: Cardiovascular;  Laterality: N/A;   US ECHOCARDIOGRAPHY  08/07/2009   EF 55-60%   Current Facility-Administered Medications  Medication Dose Route Frequency Provider Last Rate Last Admin   acetaminophen (TYLENOL) tablet 650 mg  650 mg Oral Q6H Everhart, Kirstie, DO       albuterol (PROVENTIL) (2.5 MG/3ML) 0.083% nebulizer solution 2.5 mg  2.5 mg Inhalation Q6H PRN Lockie Mola, MD       amLODipine (NORVASC) tablet 5 mg  5 mg Oral q AM Lockie Mola, MD   5 mg at 12/23/22 0865   apixaban (ELIQUIS) tablet 5 mg  5 mg Oral BID Lockie Mola, MD   5 mg at 12/23/22 7846   bisoprolol (ZEBETA) tablet 5 mg  5 mg Oral Daily Lockie Mola, MD   5 mg at 12/23/22 0822   fluticasone furoate-vilanterol (BREO ELLIPTA) 200-25 MCG/ACT 1 puff  1 puff Inhalation Daily Caro Laroche, DO   1 puff at 12/23/22 0806   And   umeclidinium bromide (INCRUSE ELLIPTA) 62.5 MCG/ACT 1 puff  1 puff Inhalation Daily Caro Laroche, DO   1 puff at 12/23/22 0806   gabapentin (NEURONTIN) capsule 300 mg  300 mg Oral TID Lockie Mola, MD   300 mg at 12/23/22 9629   levothyroxine (SYNTHROID) tablet 175 mcg  175 mcg Oral QAC breakfast Lockie Mola, MD   175 mcg at 12/23/22 0821   montelukast (SINGULAIR) tablet 10 mg  10 mg Oral Daily Lockie Mola, MD   10 mg at 12/23/22 0821   pantoprazole (PROTONIX) EC tablet 40 mg  40 mg Oral QAC breakfast Lockie Mola, MD   40 mg at 12/23/22 5284   predniSONE (DELTASONE) tablet 2.5 mg  2.5 mg Oral Q breakfast Lockie Mola, MD   2.5 mg at 12/23/22 1324   prochlorperazine (COMPAZINE) injection 10 mg  10 mg Intravenous Q6H PRN Lockie Mola, MD       rOPINIRole (REQUIP) tablet 1 mg  1 mg Oral BID Lockie Mola, MD   1 mg at 12/23/22 4010   rosuvastatin (CRESTOR)  tablet 10 mg  10 mg Oral QHS Lockie Mola, MD   10 mg at 12/22/22 2239   Allergies as of 12/22/2022 - Review Complete 12/22/2022  Allergen Reaction Noted   Cephalexin  12/05/2022   Meloxicam Rash and Other (See Comments) 01/31/2012   Vancomycin Other (See Comments) 09/02/2015   Review of Systems:  Review of Systems  Respiratory:  Negative for shortness of breath.   Cardiovascular:  Negative for chest pain.  Gastrointestinal:  Positive for abdominal pain and constipation. Negative for nausea and vomiting.    OBJECTIVE:   Temp:  [97.7 F (36.5 C)-99 F (37.2 C)] 97.7 F (36.5 C) (08/26 0835) Pulse Rate:  [61-65] 61 (08/26 0835) Resp:  [11-18] 18 (08/26 0835) BP: (97-159)/(64-95) 120/69 (08/26 0835) SpO2:  [92 %-100 %] 100 % (08/26 0835) Weight:  [74.7 kg] 74.7 kg (08/25 1310) Last BM Date :  (patient doesn't remember) Physical Exam Constitutional:      General: He is not in acute distress.    Appearance: He is not ill-appearing, toxic-appearing or diaphoretic.  Cardiovascular:     Rate and Rhythm: Normal rate and regular rhythm.  Pulmonary:     Effort: No respiratory distress.     Breath sounds: Normal breath sounds.     Comments: 2L nasal cannula Abdominal:     General: Bowel sounds are normal.     Palpations: Abdomen is soft.     Tenderness: There is abdominal tenderness (diffuse).  Musculoskeletal:        General: Deformity (RLE) present.  Skin:    General: Skin is warm and dry.  Neurological:     Mental Status: He is alert.     Labs: Recent Labs    12/22/22 0757 12/22/22 0807  WBC 14.5*  --   HGB 13.3 15.6  HCT 43.9 46.0  PLT 299  --    BMET Recent Labs    12/22/22 0757 12/22/22 0807 12/23/22 0726  NA 137 133* 135  K 3.6 3.8 3.6  CL 99 110 98  CO2 20*  --  27  GLUCOSE 136* 135* 88  BUN 22 26* 21  CREATININE 1.94* 1.90* 1.77*  CALCIUM 10.1  --  8.6*   LFT Recent Labs    12/22/22 0757  PROT 8.7*  ALBUMIN 4.7  AST 37  ALT 20  ALKPHOS  69  BILITOT 1.0   PT/INR No results for input(s): "LABPROT", "INR" in the last 72 hours. Diagnostic imaging: CT ABDOMEN PELVIS WO CONTRAST  Result Date: 12/22/2022 CLINICAL DATA:  Acute abdominal pain.  Nausea. EXAM: CT ABDOMEN AND PELVIS WITHOUT CONTRAST TECHNIQUE: Multidetector CT imaging of the abdomen and pelvis was performed following the standard protocol without IV contrast. RADIATION DOSE REDUCTION: This exam was performed according to the departmental dose-optimization program which includes automated exposure control, adjustment of the mA and/or kV according to patient size and/or use of iterative reconstruction technique. COMPARISON:  12/19/2022 FINDINGS: Lower chest: No acute findings. Hepatobiliary: No mass visualized on this unenhanced exam. Gallstones are seen, however there is no evidence of cholecystitis or biliary dilatation. Pancreas: No mass or inflammatory process visualized on this unenhanced exam. Spleen:  Within normal limits in size. Adrenals/Urinary tract: Moderate right renal atrophy and bilateral renal parenchymal scarring. No evidence of urolithiasis or hydronephrosis. Unremarkable unopacified urinary bladder. Stomach/Bowel: Normal appendix visualized. Large stool burden seen extending through the sigmoid colon, increased since previous study. A transition point is seen in the mid sigmoid colon, without definite No evidence of wall thickening or inflammatory change this site. This may be due to recent defecation, however stricture in the sigmoid colon cannot definitely be excluded. No evidence of inflammatory process or abnormal fluid collections. Vascular/Lymphatic: No pathologically enlarged lymph nodes identified. No evidence of abdominal aortic aneurysm. Reproductive:  No mass or other significant abnormality. Other: Small umbilical hernia, which contains only fat. Small left inguinal hernia, which contains only fat. Musculoskeletal:  No suspicious  bone lesions identified.  IMPRESSION: Large stool burden with transition point in the mid sigmoid colon. No evidence of wall thickening or inflammatory change. This may be due to recent defecation/evacuation, however stricture in the sigmoid colon cannot definitely be excluded. Cholelithiasis. No radiographic evidence of cholecystitis. Small umbilical and left inguinal hernias, which contain only fat. Electronically Signed   By: Danae Orleans M.D.   On: 12/22/2022 09:37    IMPRESSION: Diffuse abdominal discomfort Increased stool burden Atrial fibrillation COPD Type 2 diabetes mellitus GERD  PLAN: -Continue with aggressive bowel regimen with polyethylene glycol -Ok for diet as tolerated -Monitor bowel movements -[facility] will follow   LOS: 1 day   Liliane Shi, DO [facility]

## 2022-12-24 ENCOUNTER — Encounter: Payer: Self-pay | Admitting: Orthopedic Surgery

## 2022-12-24 DIAGNOSIS — N179 Acute kidney failure, unspecified: Secondary | ICD-10-CM | POA: Diagnosis not present

## 2022-12-24 DIAGNOSIS — K5900 Constipation, unspecified: Secondary | ICD-10-CM | POA: Diagnosis not present

## 2022-12-24 LAB — BASIC METABOLIC PANEL
Anion gap: 10 (ref 5–15)
BUN: 12 mg/dL (ref 8–23)
CO2: 26 mmol/L (ref 22–32)
Calcium: 8.2 mg/dL — ABNORMAL LOW (ref 8.9–10.3)
Chloride: 100 mmol/L (ref 98–111)
Creatinine, Ser: 1.54 mg/dL — ABNORMAL HIGH (ref 0.61–1.24)
GFR, Estimated: 47 mL/min — ABNORMAL LOW (ref >60)
Glucose, Bld: 92 mg/dL (ref 70–99)
Potassium: 3.4 mmol/L — ABNORMAL LOW (ref 3.5–5.1)
Sodium: 136 mmol/L (ref 135–145)

## 2022-12-24 MED ORDER — GABAPENTIN 300 MG PO CAPS
600.0000 mg | ORAL_CAPSULE | Freq: Three times a day (TID) | ORAL | Status: DC
  Administered 2022-12-24: 600 mg via ORAL
  Filled 2022-12-24: qty 2

## 2022-12-24 MED ORDER — SPIRONOLACTONE 25 MG PO TABS
25.0000 mg | ORAL_TABLET | Freq: Every day | ORAL | Status: DC
  Administered 2022-12-24: 25 mg via ORAL
  Filled 2022-12-24: qty 1

## 2022-12-24 MED ORDER — ACETAMINOPHEN 325 MG PO TABS: 650.0000 mg | ORAL_TABLET | Freq: Four times a day (QID) | ORAL | Status: AC | PRN

## 2022-12-24 MED ORDER — EMPAGLIFLOZIN 10 MG PO TABS
10.0000 mg | ORAL_TABLET | Freq: Every day | ORAL | Status: DC
  Administered 2022-12-24: 10 mg via ORAL
  Filled 2022-12-24: qty 1

## 2022-12-24 MED ORDER — VITAMIN D 50 MCG (2000 UT) PO TABS: 4000.0000 [IU] | ORAL_TABLET | Freq: Every day | ORAL | Status: DC

## 2022-12-24 MED ORDER — GABAPENTIN 300 MG PO CAPS: 600.0000 mg | ORAL_CAPSULE | Freq: Three times a day (TID) | ORAL | Status: DC

## 2022-12-24 NOTE — Discharge Summary (Signed)
Family Medicine Teaching [facility] Discharge Summary  Patient name: [name] Medical record number: [id number] Date of birth: [birth year] Age: 72 y.o. Gender: male Date of Admission: 12/22/2022  Date of Discharge: 12/24/22  Admitting Physician: Lorayne Bender, MD  Primary Care Provider: Alysia Penna, MD Consultants: Gastroenterology, General Surgery   Indication for Hospitalization: Abdominal pain, inability to tolerate po   Discharge Diagnoses/Problem List:  Principal Problem for Admission: Severe Constipation, Dehydration  Other Problems addressed during stay:  Principal Problem:   Constipation Active Problems:   Hypothyroidism   Acute on chronic diastolic (congestive) heart failure (HCC)   History of below-knee amputation of right lower extremity (HCC)   AKI (acute kidney injury) [facility])    Brief Hospital Course:  [name] is a 72 y.o.male with a history of R BKA, DMII, CKD3, hypothyroidism, paroxysmal Afib, and AV replacement who was admitted to the [facility] Medicine Teaching Service at [facility] for generalized abdominal pain. His hospital course is detailed below:   Constipation Presented with 2 weeks of abdominal pain, nausea, and constipation. CTAP showed large stool burden with possible stricture at mid sigmoid colon. Patient with recent colonoscopy showing thickening in sigmoid region. Patient with chronic opioid use and more recent doubling of opioid use secondary to recent BKA and revisions. Also most likely contributing, TSH markedly elevated at 84.5 and free T4 <0.25. Patient had been compliant on medication. Did throw up medication including synthroid, or not take due to nausea for a week; however, unlikely to cause that much of an abnormality. Unsure what caused such abnormal thyroid level. He was treated with enemas, miralax bowel prep. Finally started to slowly have bowel movements on 8/25. Abdominal pain had resolved by discharge. Patient discharged  without any opioids and with encouragement to continue miralax. GI evaluation resulted that patient does not have significant sigmoid stricture. Does not need to be followed up with another colonoscopy, but will follow up outpatinet.   AKI on CKD  Cr 1.95 on admission from baseline around 1.5. Improved with IVF down to 1.77. Continue to monitor creatinine and hold nephrotoxic drugs  Chronic pain Pain from BKA performed in May in addition to revisions. Pain regimen adjusted while inpatient. Avoided opioids due to constipation. Discharged with 600 mg gabapentin TID and prn ibuprofen and  tylenol.   Other chronic conditions were medically managed with home medications and formulary alternatives as necessary (CKD3, R BKA, HTN, HLD, COPD, T2DM, A-fib w/ AV replacement)  PCP Follow-up Recommendations: Recommend endocrinology referral and thyroid ultrasound as TSH found to be significantly elevated despite reported adherence with synthroid.  Consider other modalities for phantom pain of BKA such as cymbalta if patient continues to have pain. Avoid opioids.  F/u GI appointment.    Disposition: Home  Discharge Condition: stable  Discharge Exam:  Vitals:   12/24/22 0905 12/24/22 1658  BP: 135/78 132/80  Pulse: 66 63  Resp: 16 18  Temp: (!) 97.5 F (36.4 C) 97.7 F (36.5 C)  SpO2: 100% 100%   Physical Exam: General: Alert and oriented, well-appearing. In no apparent distress. Cardiovascular: Regular rate and rhythm. Normal S1/S2. No murmurs, rubs, or gallops. 2+ radial pulses. Pulmonary: Lungs clear to auscultation bilaterally. No wheezing, crackles, or rales present. Wears 2L  O2 at baseline and inpatient  Abdominal: Moderate tenderness to light palpation.  No rebound or guarding.  Mildly distended abdomen, primarily on the left side. Skin: Warm and dry.  No rashes grossly. Extremities: R BKA covered in shrink  sock.  No peripheral edema in LLE.    Significant Procedures:  None  Significant Labs and Imaging:  No results for input(s): "WBC", "HGB", "HCT", "PLT" in the last 48 hours. Recent Labs  Lab 12/23/22 0726 12/24/22 0811  NA 135 136  K 3.6 3.4*  CL 98 100  CO2 27 26  GLUCOSE 88 92  BUN 21 12  CREATININE 1.77* 1.54*  CALCIUM 8.6* 8.2*  MG 2.4  --     CT Abdomen Pelvis wo Contrast IMPRESSION: Large stool burden with transition point in the mid sigmoid colon. No evidence of wall thickening or inflammatory change. This may be due to recent defecation/evacuation, however stricture in the sigmoid colon cannot definitely be excluded.   Cholelithiasis. No radiographic evidence of cholecystitis.   Small umbilical and left inguinal hernias, which contain only fat.  Results/Tests Pending at Time of Discharge: None  Discharge Medications:  Allergies as of 12/24/2022       Reactions   Cephalexin    Meloxicam Rash, Other (See Comments)   Red Man Syndrome, also   Vancomycin Other (See Comments)   Red Man's syndrome 09/02/15, resolved with diphenhydramine and slowing of rate        Medication List     STOP taking these medications    Mucinex DM Maximum Strength 60-1200 MG Tb12   ondansetron 4 MG tablet Commonly known as: Zofran   oxyCODONE-acetaminophen 5-325 MG tablet Commonly known as: PERCOCET/ROXICET   sulfamethoxazole-trimethoprim 800-160 MG tablet Commonly known as: BACTRIM DS   traZODone 50 MG tablet Commonly known as: DESYREL       TAKE these medications    acetaminophen 325 MG tablet Commonly known as: TYLENOL Take 2 tablets (650 mg total) by mouth every 6 (six) hours as needed.   albuterol 108 (90 Base) MCG/ACT inhaler Commonly known as: VENTOLIN HFA Inhale 2 puffs into the lungs every 6 (six) hours as needed for wheezing or shortness of breath.   amLODipine 5 MG tablet Commonly known as: NORVASC Take 5 mg by mouth in the morning.   apixaban 5 MG Tabs tablet Commonly known as: ELIQUIS Take 1 tablet (5 mg  total) by mouth 2 (two) times daily.   bisoprolol 5 MG tablet Commonly known as: ZEBETA Take 1 tablet (5 mg total) by mouth daily. Please call office to arrange follow up appointment   Breztri Aerosphere 160-9-4.8 MCG/ACT Aero Generic drug: Budeson-Glycopyrrol-Formoterol Inhale 2 puffs into the lungs in the morning and at bedtime.   Cal-Mag-Zinc-D Tabs Take 1 tablet by mouth daily.   empagliflozin 10 MG Tabs tablet Commonly known as: Jardiance Take 1 tablet (10 mg total) by mouth daily.   gabapentin 300 MG capsule Commonly known as: NEURONTIN Take 2 capsules (600 mg total) by mouth 3 (three) times daily. for neuropathy   hydrOXYzine 10 MG tablet Commonly known as: ATARAX TAKE 1 TABLET(10 MG) BY MOUTH THREE TIMES DAILY AS NEEDED   levothyroxine 175 MCG tablet Commonly known as: SYNTHROID Take 175 mcg by mouth daily before breakfast.   montelukast 10 MG tablet Commonly known as: SINGULAIR TAKE 1 TABLET(10 MG) BY MOUTH AT BEDTIME What changed: See the new instructions.   multivitamin with minerals Tabs tablet Take 1 tablet by mouth daily.   OXYGEN Inhale 2 L/min into the lungs See admin instructions. 2 L/min at bedtime and 2 L/min throughout the day as needed for shortness of breath   pantoprazole 40 MG tablet Commonly known as: PROTONIX Take 40 mg by mouth  daily before breakfast.   polyethylene glycol 17 g packet Commonly known as: MIRALAX / GLYCOLAX Take 17 g by mouth 2 (two) times daily as needed.   potassium chloride SA 20 MEQ tablet Commonly known as: KLOR-CON M Take 20 mEq by mouth See admin instructions. Take 20 meq twice daily every other day   predniSONE 2.5 MG tablet Commonly known as: DELTASONE TAKE 1 TABLET(2.5 MG) BY MOUTH DAILY WITH BREAKFAST What changed: See the new instructions.   rOPINIRole 1 MG tablet Commonly known as: REQUIP Take 1 tablet (1 mg total) by mouth in the morning and at bedtime.   rosuvastatin 10 MG tablet Commonly known as:  CRESTOR Take 10 mg by mouth at bedtime.   senna-docusate 8.6-50 MG tablet Commonly known as: Senokot-S Take 1 tablet by mouth 2 (two) times daily.   spironolactone 25 MG tablet Commonly known as: ALDACTONE Take 1 tablet (25 mg total) by mouth daily.   Systane Ultra PF 0.4-0.3 % Soln Generic drug: Polyethyl Glyc-Propyl Glyc PF Place 1 drop into both eyes 2 (two) times daily as needed (dry eyes).   TIGER BALM ARTHRITIS RUB EX Apply 1 application topically as needed (for leg pain).   torsemide 10 MG tablet Commonly known as: DEMADEX Take 15 mg by mouth every other day.   Vitamin D 50 MCG (2000 UT) tablet Take 2 tablets (4,000 Units total) by mouth daily. What changed: how much to take        Discharge Instructions: Please refer to Patient Instructions section of EMR for full details.  Patient was counseled important signs and symptoms that should prompt return to medical care, changes in medications, dietary instructions, activity restrictions, and follow up appointments.   Follow-Up Appointments:  Follow-up Information     Alysia Penna, MD. Schedule an appointment as soon as possible for a visit in 1 week(s).   Specialty: Internal Medicine Contact information: [address] [place] [address] [phone]                 Lockie Mola, MD 12/24/2022, 10:29 PM PGY-2, [facility]

## 2022-12-24 NOTE — Plan of Care (Signed)

## 2022-12-24 NOTE — Assessment & Plan Note (Addendum)
TSH of 84.537, Free T4 <0.25. Hypothyroidism likely contributing to constipation. Currently taking Levothyroxine 175 mcg daily before breakfast. - Considered increase Levothyroxine dose  - Endocrinology follow - up - Outpatient thyroid US considered due to hx of hyperthyroidism and rapid increase in worsened hypothyroidism and possible parathyroid involvement with low ionized calcium

## 2022-12-24 NOTE — Assessment & Plan Note (Signed)
Pt with CKD3, baseline Cr appears to be approximately 1.5. Cr on admission 1.95, improved to 1.54 on recheck.   -Continue IV fluids -Restarted Jardiance and spironolactone -Continuing to hold torsemide

## 2022-12-24 NOTE — Assessment & Plan Note (Signed)
Last bowel movement prior to admission was approximately 1 week ago.  One small bowel movement yesterday following enema, continuing to moniotr for bowel movements with Miralax prep. Still only consuming clear liquid with orders to advance diet as tolerated. -Complete go-lightly miralax prep to aid in bowel cleanse (3/4 completed, 1/4 remaining) -Compazine Q6h PRN for nausea, Use caution with prolonged QT -Avoiding opioids for pain control, can give one-time does PRN if severe pain -PO 650 mg tylenol scheduled  -Increased gabapentin to 600mg  TID - [facility] outpatient follow- up

## 2022-12-24 NOTE — Progress Notes (Signed)
[facility] Progress Note  SUBJECTIVE:   Interval history: [name] was seen and evaluated today at bedside. Resting comfortably in bed. Noted that his abdominal pain is lessened. He has not been eating, he has tolerated bowel preparation to induce bowel movement, is on his last cup this AM. No nausea or vomiting. He noted that he has not had a bowel movement yet.   Past Medical History:  Diagnosis Date   Aortic stenosis, mild    Arthritis of hand    "just a little bit in both hands" (03/31/2012)   Asthma    "little bit" (03/31/2012)   Atrial fibrillation (HCC)    dx '04; DCCV '04, placed on flecainide, failed DCCV 04/2010, flecainide stopped; s/p successful A.fib ablation 01/31/12   CHF (congestive heart failure) (HCC)    Controlled type 2 diabetes mellitus without complication, without long-term current use of insulin (HCC) 12/13/2019   COPD (chronic obstructive pulmonary disease) (HCC)    DJD (degenerative joint disease)    Fatty liver    Fibromyalgia    GERD (gastroesophageal reflux disease)    Hyperlipidemia    Hypertension    Hypothyroidism    S/P radiation   Left atrial enlargement    LA size 45mm by echo 11/21/11   Mitral regurgitation    trivial   Obstructive sleep apnea    mild by sleep study 2013; pt stated he does not have a machine because "it wasn't bad enough for him to have one"   Pacemaker 03/31/2012   Pneumonia    last time 2023   Restless leg syndrome    Second degree AV block    Splenomegaly    Past Surgical History:  Procedure Laterality Date   AMPUTATION Right 08/23/2022   Procedure: RIGHT FOOT FIFTH RAY AMPUTATION;  Surgeon: [name], MD;  Location: [facility] OR;  Service: Orthopedics;  Laterality: Right;   AMPUTATION Right 09/04/2022   Procedure: RIGHT BELOW KNEE AMPUTATION;  Surgeon: [name], MD;  Location: [facility] OR;  Service: Orthopedics;  Laterality: Right;   AORTIC VALVE REPLACEMENT N/A 08/22/2015   Procedure: AORTIC VALVE  REPLACEMENT (AVR);  Surgeon: Kerin Perna, MD;  Location: [facility] OR;  Service: Open Heart Surgery;  Laterality: N/A;   ATRIAL FIBRILLATION ABLATION  01/30/2012   PVI by Dr. Johney Frame   ATRIAL FIBRILLATION ABLATION N/A 01/31/2012   Procedure: ATRIAL FIBRILLATION ABLATION;  Surgeon: [name], MD;  Location: [facility] CATH LAB;  Service: Cardiovascular;  Laterality: N/A;   ATRIAL FIBRILLATION ABLATION N/A 04/19/2020   Procedure: ATRIAL FIBRILLATION ABLATION;  Surgeon: [name], MD;  Location: [facility] INVASIVE CV LAB;  Service: Cardiovascular;  Laterality: N/A;   BACK SURGERY     X 3   BIOPSY  01/01/2022   Procedure: BIOPSY;  Surgeon: Kathi Der, MD;  Location: [facility] ENDOSCOPY;  Service: Gastroenterology;;   CARDIAC CATHETERIZATION N/A 08/09/2015   Procedure: Right/Left Heart Cath and Coronary Angiography;  Surgeon: Peter M Swaziland, MD;  Location: [facility] INVASIVE CV LAB;  Service: Cardiovascular;  Laterality: N/A;   CARDIAC CATHETERIZATION N/A 02/05/2016   Procedure: Right/Left Heart Cath and Coronary Angiography;  Surgeon: Corky Crafts, MD;  Location: [facility] INVASIVE CV LAB;  Service: Cardiovascular;  Laterality: N/A;   CARDIAC CATHETERIZATION N/A 04/17/2016   Procedure: Right Heart Cath;  Surgeon: Dolores Patty, MD;  Location: [facility] INVASIVE CV LAB;  Service: Cardiovascular;  Laterality: N/A;   CARDIOVASCULAR STRESS TEST  03/12/2002   EF 48%, NO EVIDENCE OF ISCHEMIA  CARDIOVERSION  01/2012; 03/31/2012   CARDIOVERSION N/A 02/25/2012   Procedure: CARDIOVERSION;  Surgeon: [name], MD;  Location: [facility] CATH LAB;  Service: Cardiovascular;  Laterality: N/A;   CARDIOVERSION N/A 03/31/2012   Procedure: CARDIOVERSION;  Surgeon: [name], MD;  Location: [facility] CATH LAB;  Service: Cardiovascular;  Laterality: N/A;   CARDIOVERSION N/A 11/23/2015   Procedure: CARDIOVERSION;  Surgeon: Laurey Morale, MD;  Location: [facility] ENDOSCOPY;  Service: Cardiovascular;  Laterality: N/A;   CARDIOVERSION N/A 04/08/2016    Procedure: CARDIOVERSION;  Surgeon: Dolores Patty, MD;  Location: [facility] ENDOSCOPY;  Service: Cardiovascular;  Laterality: N/A;   CARDIOVERSION N/A 09/14/2018   Procedure: CARDIOVERSION;  Surgeon: Wendall Stade, MD;  Location: [facility] ENDOSCOPY;  Service: Cardiovascular;  Laterality: N/A;   CARDIOVERSION N/A 10/19/2019   Procedure: CARDIOVERSION;  Surgeon: Dolores Patty, MD;  Location: [facility] ENDOSCOPY;  Service: Cardiovascular;  Laterality: N/A;   CARDIOVERSION N/A 02/25/2020   Procedure: CARDIOVERSION;  Surgeon: Pricilla Riffle, MD;  Location: [facility] ENDOSCOPY;  Service: Cardiovascular;  Laterality: N/A;   CARDIOVERSION N/A 05/25/2020   Procedure: CARDIOVERSION;  Surgeon: Lewayne Bunting, MD;  Location: [facility] ENDOSCOPY;  Service: Cardiovascular;  Laterality: N/A;   CARDIOVERSION N/A 07/10/2020   Procedure: CARDIOVERSION;  Surgeon: Jake Bathe, MD;  Location: [facility] ENDOSCOPY;  Service: Cardiovascular;  Laterality: N/A;   CLIPPING OF ATRIAL APPENDAGE N/A 08/22/2015   Procedure: CLIPPING OF ATRIAL APPENDAGE;  Surgeon: Kerin Perna, MD;  Location: [facility] OR;  Service: Open Heart Surgery;  Laterality: N/A;   COLONOSCOPY WITH PROPOFOL N/A 01/01/2022   Procedure: COLONOSCOPY WITH PROPOFOL;  Surgeon: Kathi Der, MD;  Location: [facility] ENDOSCOPY;  Service: Gastroenterology;  Laterality: N/A;   DOPPLER ECHOCARDIOGRAPHY  03/11/2002   EF 70-75%   FINGER SURGERY Left    Middle finger   FINGER TENDON REPAIR  1980's   "right little finger" (03/31/2012)   FLEXIBLE SIGMOIDOSCOPY N/A 12/30/2021   Procedure: FLEXIBLE SIGMOIDOSCOPY;  Surgeon: Charlott Rakes, MD;  Location: [facility] ENDOSCOPY;  Service: Gastroenterology;  Laterality: N/A;   INSERT / REPLACE / REMOVE PACEMAKER     [employer] Jude   IR RADIOLOGIST EVAL & MGMT  05/06/2019   IR RADIOLOGIST EVAL & MGMT  10/06/2019   IR RADIOLOGIST EVAL & MGMT  10/31/2020   IR RADIOLOGIST EVAL & MGMT  01/18/2021   IR RADIOLOGIST EVAL & MGMT  03/08/2021   IR RADIOLOGIST EVAL & MGMT   06/13/2021   KIDNEY SURGERY Left    LEFT AND RIGHT HEART CATHETERIZATION WITH CORONARY ANGIOGRAM N/A 10/27/2012   Procedure: LEFT AND RIGHT HEART CATHETERIZATION WITH CORONARY ANGIOGRAM;  Surgeon: Pamella Pert, MD;  Location: [facility] CATH LAB;  Service: Cardiovascular;  Laterality: N/A;   LUMBAR DISC SURGERY  1980's X2;  2000's   MAZE N/A 08/22/2015   Procedure: MAZE;  Surgeon: Kerin Perna, MD;  Location: [facility] OR;  Service: Open Heart Surgery;  Laterality: N/A;   PACEMAKER INSERTION  03/31/2012   STJ Accent DR pacemaker implanted by Dr Johney Frame   PERMANENT PACEMAKER INSERTION N/A 03/31/2012   Procedure: PERMANENT PACEMAKER INSERTION;  Surgeon: [name], MD;  Location: [facility] CATH LAB;  Service: Cardiovascular;  Laterality: N/A;   PPM GENERATOR CHANGEOUT N/A 08/08/2020   Procedure: PPM GENERATOR CHANGEOUT;  Surgeon: [name], MD;  Location: [facility] INVASIVE CV LAB;  Service: Cardiovascular;  Laterality: N/A;   RADIOLOGY WITH ANESTHESIA Left 02/14/2021   Procedure: RADIOLOGY WITH ANESTHESIA LEFT RENAL CRYOABLATION;  Surgeon: Irish Lack, MD;  Location:  [facility] ORS;  Service: Radiology;  Laterality: Left;   STUMP REVISION Right 10/09/2022   Procedure: REVISION RIGHT BELOW KNEE AMPUTATION;  Surgeon: [name], MD;  Location: [facility] OR;  Service: Orthopedics;  Laterality: Right;   STUMP REVISION Right 11/16/2022   Procedure: REVISION RIGHT BELOW KNEE AMPUTATION;  Surgeon: [name], MD;  Location: [facility] OR;  Service: Orthopedics;  Laterality: Right;   TEE WITHOUT CARDIOVERSION  01/30/2012   Procedure: TRANSESOPHAGEAL ECHOCARDIOGRAM (TEE);  Surgeon: Laurey Morale, MD;  Location: [facility] ENDOSCOPY;  Service: Cardiovascular;  Laterality: N/A;  ablation next day   TEE WITHOUT CARDIOVERSION N/A 08/22/2015   Procedure: TRANSESOPHAGEAL ECHOCARDIOGRAM (TEE);  Surgeon: Kerin Perna, MD;  Location: [facility] OR;  Service: Open Heart Surgery;  Laterality: N/A;   TEE WITHOUT CARDIOVERSION N/A 10/19/2019   Procedure:  TRANSESOPHAGEAL ECHOCARDIOGRAM (TEE);  Surgeon: Dolores Patty, MD;  Location: [facility] ENDOSCOPY;  Service: Cardiovascular;  Laterality: N/A;   US ECHOCARDIOGRAPHY  08/07/2009   EF 55-60%   Current Facility-Administered Medications  Medication Dose Route Frequency Provider Last Rate Last Admin   acetaminophen (TYLENOL) tablet 650 mg  650 mg Oral Q6H Everhart, Kirstie, DO   650 mg at 12/24/22 0604   albuterol (PROVENTIL) (2.5 MG/3ML) 0.083% nebulizer solution 2.5 mg  2.5 mg Inhalation Q6H PRN Lockie Mola, MD       amLODipine (NORVASC) tablet 5 mg  5 mg Oral q AM Lockie Mola, MD   5 mg at 12/24/22 0604   apixaban (ELIQUIS) tablet 5 mg  5 mg Oral BID Lockie Mola, MD   5 mg at 12/24/22 0908   bisoprolol (ZEBETA) tablet 5 mg  5 mg Oral Daily Lockie Mola, MD   5 mg at 12/24/22 0908   fluticasone furoate-vilanterol (BREO ELLIPTA) 200-25 MCG/ACT 1 puff  1 puff Inhalation Daily Caro Laroche, DO   1 puff at 12/24/22 0815   And   umeclidinium bromide (INCRUSE ELLIPTA) 62.5 MCG/ACT 1 puff  1 puff Inhalation Daily Caro Laroche, DO   1 puff at 12/24/22 0815   gabapentin (NEURONTIN) capsule 300 mg  300 mg Oral TID Lockie Mola, MD   300 mg at 12/24/22 2956   levothyroxine (SYNTHROID) tablet 175 mcg  175 mcg Oral QAC breakfast Lockie Mola, MD   175 mcg at 12/24/22 0907   montelukast (SINGULAIR) tablet 10 mg  10 mg Oral Daily Lockie Mola, MD   10 mg at 12/24/22 0907   pantoprazole (PROTONIX) EC tablet 40 mg  40 mg Oral QAC breakfast Lockie Mola, MD   40 mg at 12/24/22 2130   predniSONE (DELTASONE) tablet 2.5 mg  2.5 mg Oral Q breakfast Lockie Mola, MD   2.5 mg at 12/24/22 8657   prochlorperazine (COMPAZINE) injection 10 mg  10 mg Intravenous Q6H PRN Lockie Mola, MD       rOPINIRole (REQUIP) tablet 1 mg  1 mg Oral BID Lockie Mola, MD   1 mg at 12/24/22 8469   rosuvastatin (CRESTOR) tablet 10 mg  10 mg Oral QHS Lockie Mola, MD   10 mg at 12/23/22 2140   Allergies as  of 12/22/2022 - Review Complete 12/22/2022  Allergen Reaction Noted   Cephalexin  12/05/2022   Meloxicam Rash and Other (See Comments) 01/31/2012   Vancomycin Other (See Comments) 09/02/2015   Review of Systems:  Review of Systems  Gastrointestinal:  Positive for abdominal pain. Negative for constipation, diarrhea, nausea and vomiting.    OBJECTIVE:   Temp:  [  97.5 F (36.4 C)-98.2 F (36.8 C)] 97.5 F (36.4 C) (08/27 0905) Pulse Rate:  [56-66] 66 (08/27 0905) Resp:  [16-18] 16 (08/27 0905) BP: (120-139)/(75-90) 135/78 (08/27 0905) SpO2:  [92 %-100 %] 100 % (08/27 0905) Last BM Date :  (patient doesn't remember) Physical Exam Constitutional:      General: He is not in acute distress.    Appearance: He is not ill-appearing, toxic-appearing or diaphoretic.  Cardiovascular:     Rate and Rhythm: Normal rate and regular rhythm.  Pulmonary:     Effort: No respiratory distress.     Breath sounds: Normal breath sounds.  Abdominal:     General: Bowel sounds are normal. There is no distension.     Palpations: Abdomen is soft.     Tenderness: There is abdominal tenderness.  Neurological:     Mental Status: He is alert.     Labs: Recent Labs    12/22/22 0757 12/22/22 0807  WBC 14.5*  --   HGB 13.3 15.6  HCT 43.9 46.0  PLT 299  --    BMET Recent Labs    12/22/22 0757 12/22/22 0807 12/23/22 0726  NA 137 133* 135  K 3.6 3.8 3.6  CL 99 110 98  CO2 20*  --  27  GLUCOSE 136* 135* 88  BUN 22 26* 21  CREATININE 1.94* 1.90* 1.77*  CALCIUM 10.1  --  8.6*   LFT Recent Labs    12/22/22 0757  PROT 8.7*  ALBUMIN 4.7  AST 37  ALT 20  ALKPHOS 69  BILITOT 1.0   PT/INR No results for input(s): "LABPROT", "INR" in the last 72 hours. Diagnostic imaging: No results found.  IMPRESSION: Diffuse abdominal discomfort Increased stool burden Atrial fibrillation on apixaban COPD Type 2 diabetes mellitus GERD  PLAN: -Tolerating aggressive bowel regimen, await bowel  movement  -Ok for diet as tolerated from GI perspective -[facility] will follow   LOS: 1 day   Liliane Shi, DO [facility]

## 2022-12-24 NOTE — Progress Notes (Addendum)
Daily Progress Note Intern Pager: [phone]  Patient name: [name] Medical record number: [id number] Date of birth: Aug 01, 1950 Age: 72 y.o. Gender: male  Primary Care Provider: Alysia Penna, MD Consultants: Gastroenterology  Code Status: DNR  Pt Overview and Major Events to Date:  8/25: Patient admitted   Assessment and Plan: Patient continuing aggressive bowel regimen with polyethylene glycol. One small bowel movement yesterday evening, continuing to monitor for further bowel movements. Attempting to taper/ discontinue opiate use by discharge date given several months passing since his BKA and sequela of constipation. Past medical hx significant for CHF, COPD, right sided BKA, HTN, HLD, a-fib w/ stent placement on eliquis, CKD3. Assessment & Plan Constipation Last bowel movement prior to admission was approximately 1 week ago.  One small bowel movement yesterday following enema, continuing to moniotr for bowel movements with Miralax prep. Still only consuming clear liquid with orders to advance diet as tolerated. -Complete go-lightly miralax prep to aid in bowel cleanse (3/4 completed, 1/4 remaining) -Compazine Q6h PRN for nausea, Use caution with prolonged QT -Avoiding opioids for pain control, can give one-time does PRN if severe pain -PO 650 mg tylenol scheduled  -Increased gabapentin to 600mg  TID - [facility] outpatient follow- up AKI (acute kidney injury) (HCC) Pt with CKD3, baseline Cr appears to be approximately 1.5. Cr on admission 1.95, improved to 1.54 on recheck.   -Continue IV fluids -Restarted Jardiance and spironolactone -Continuing to hold torsemide Hypothyroidism TSH of 84.537, Free T4 <0.25. Hypothyroidism likely contributing to constipation. Currently taking Levothyroxine 175 mcg daily before breakfast. - Considered increase Levothyroxine dose  - Endocrinology follow - up - Outpatient thyroid US considered due to hx of hyperthyroidism and rapid  increase in worsened hypothyroidism and possible parathyroid involvement with low ionized calcium   Chronic and Stable Conditions: COPD: Scheduled breztri, singulair, prednisone 2.5mg  daily, albuterol prn. On 2L [place] O2 at baseline CHF: EF 55-60% Home regimen: bisoprolol 5 mg, spironolactone 25 mg, torsemide 10mg , jardiance 10 mg. Continuing to hold torsemide, restarted spironolactone and jardiance in the setting of resolved AKI Afib, AV replacement: eliquis 5mg  BID DM II: Not on insulin, will adjust as needed when tolerating PO HTN; amlodipine 5mg  HLD: Continue Crestor 10 mg  Restless leg syndrome: ropinirole 1mg  BID   FEN/GI: Advance diet as tolerated per GI PPx: On Eliquis Dispo: Pending clinical improvement   Subjective:  [name] seen and evaluated today at bedside, resting comfortably. He continued to note abdominal discomfort, primarily in the peri-umbilical region, however he noted significant improvement from yesterday. He had 2 enemas on 8/25 with limited success, and an additional enema yesterday resulting in one small bowel movement in the evening. He is currently completing his Miralax/ polyethylene glycol bowel prep, which has not resulted in any bowel movements so far. He endorses that he continues to pass flatulence intermittently. He stated that his right leg stump was hurting and the pain kept him up last night. He also endorsed nausea yesterday evening in conjunction with the Miralax prep, but reports that has resolved.  Objective: Temp:  [97.7 F (36.5 C)-98.2 F (36.8 C)] 97.8 F (36.6 C) (08/27 0518) Pulse Rate:  [56-61] 56 (08/27 0518) Resp:  [18] 18 (08/27 0518) BP: (120-139)/(69-90) 139/90 (08/27 0518) SpO2:  [92 %-100 %] 100 % (08/27 0518)  Physical Exam: General: Alert and oriented, well-appearing. In no apparent distress. Cardiovascular: Regular rate and rhythm. Normal S1/S2. No murmurs, rubs, or gallops. 2+ radial pulses. Pulmonary: Lungs clear  to  auscultation bilaterally. No wheezing, crackles, or rales present. Wears 2L [place] O2 at baseline and inpatient  Abdominal: Moderate tenderness to light palpation.  No rebound or guarding.  Mildly distended abdomen, primarily on the left side. Skin: Warm and dry.  No rashes grossly. Extremities: R BKA. No peripheral edema in LLE.    Laboratory: Most recent CBC Lab Results  Component Value Date   WBC 14.5 (H) 12/22/2022   HGB 15.6 12/22/2022   HCT 46.0 12/22/2022   MCV 84.3 12/22/2022   PLT 299 12/22/2022   Most recent BMP    Latest Ref Rng & Units 12/23/2022    7:26 AM  BMP  Glucose 70 - 99 mg/dL 88   BUN 8 - 23 mg/dL 21   Creatinine 8.29 - 1.24 mg/dL 5.62   Sodium 130 - 865 mmol/L 135   Potassium 3.5 - 5.1 mmol/L 3.6   Chloride 98 - 111 mmol/L 98   CO2 22 - 32 mmol/L 27   Calcium 8.9 - 10.3 mg/dL 8.6    Dayle Points, Medical Student 12/24/2022, 7:06 AM  [facility] FPTS Intern pager: [phone], text pages welcome Secure chat group [facility] Teaching Service

## 2022-12-24 NOTE — Discharge Instructions (Signed)
Dear Gerrit Friends,   Thank you so much for allowing [place] to be part of your care!  You were admitted to [facility] for constipation and abdominal pain. You were treated with stool softeners. You also had a kidney injury due to dehydration which was treated as well. Please AVOID opioids in the future. This will not actually help your leg pain as it does not help with phantom pain and will make your constipation much worse, especially while your bowels are still stretched out from the constipation recently.    POST-HOSPITAL & CARE INSTRUCTIONS Please continue miralax twice a day for at least a couple days. Avoid opioids.  Please let PCP/Specialists know of any changes that were made.  Please see medications section of this packet for any medication changes.   DOCTOR'S APPOINTMENT & FOLLOW UP CARE INSTRUCTIONS  Future Appointments  Date Time Provider Department Center  01/14/2023  3:45 PM [name], MD [facility] None  02/06/2023  7:05 AM [facility] DEVICE REMOTES [facility]  05/08/2023  7:05 AM [facility] DEVICE REMOTES [facility]  08/07/2023  7:05 AM [facility] DEVICE REMOTES [facility]    RETURN PRECAUTIONS: If you have worsening abdominal pain, vomiting.   Take care and be well!  Family Medicine Teaching Service  [place]  [facility]  [address] [place], [place] [address] [phone]

## 2022-12-27 NOTE — Telephone Encounter (Signed)
Lets get him in next week

## 2022-12-31 ENCOUNTER — Encounter: Payer: Self-pay | Admitting: Orthopedic Surgery

## 2022-12-31 ENCOUNTER — Ambulatory Visit (INDEPENDENT_AMBULATORY_CARE_PROVIDER_SITE_OTHER): Payer: Medicare Other | Admitting: Orthopedic Surgery

## 2022-12-31 DIAGNOSIS — Z89511 Acquired absence of right leg below knee: Secondary | ICD-10-CM

## 2022-12-31 DIAGNOSIS — T8781 Dehiscence of amputation stump: Secondary | ICD-10-CM

## 2022-12-31 MED ORDER — OXYCODONE-ACETAMINOPHEN 5-325 MG PO TABS: 1.0000 | ORAL_TABLET | Freq: Three times a day (TID) | ORAL | 0 refills | Status: DC | PRN

## 2022-12-31 NOTE — Progress Notes (Addendum)
Office Visit Note   Patient: [name]           Date of Birth: Oct 04, 1950           MRN: [id number] Visit Date: 12/31/2022              Requested by: Alysia Penna, MD [address] [place],  [place] [address] PCP: Alysia Penna, MD  Chief Complaint  Patient presents with   Right Leg - Routine Post Op    10/09/2022 revision right BKA  11/16/2022 revision right BKA         HPI: Patient is a 72 year old gentleman who is 5 weeks status post revision right below-knee amputation.  Assessment & Plan: Visit Diagnoses:  1. S/P BKA (below knee amputation), right (HCC)   2. Dehiscence of amputation stump of right lower extremity (HCC)     Plan: Continue with compression he will go to [facility] to get an XL compression sleeve.  Prescription for oxycodone.  Continue Dial soap cleansing.  Follow-Up Instructions: Return in about 3 weeks (around 01/21/2023).   Ortho Exam  Patient is alert, oriented, no adenopathy, well-dressed, normal affect, normal respiratory effort. Examination there are 2 deep retained Vicryl sutures and the open wounds.  These were removed.  After debridement the wounds have excellent granulation tissue no exposed bone or tendon no cellulitis.  Imaging: No results found. No images are attached to the encounter.  Labs: Lab Results  Component Value Date   HGBA1C 6.3 (H) 11/13/2022   HGBA1C 6.5 (H) 12/27/2021   HGBA1C 6.6 (H) 06/11/2021   ESRSEDRATE 60 (H) 09/04/2015   REPTSTATUS 10/22/2022 FINAL 10/17/2022   GRAMSTAIN NO WBC SEEN NO ORGANISMS SEEN  10/09/2022   CULT  10/17/2022    NO GROWTH 5 DAYS Performed at [facility], [address], [place], [place] [address]    [facility] STAPHYLOCOCCUS AUREUS 10/09/2022   LABORGA STAPHYLOCOCCUS EPIDERMIDIS 10/09/2022     Lab Results  Component Value Date   ALBUMIN 4.7 12/22/2022   ALBUMIN 4.2 12/19/2022   ALBUMIN 3.9 11/13/2022   PREALBUMIN 25 09/04/2022    Lab Results  Component Value  Date   MG 2.4 12/23/2022   MG 2.0 06/11/2021   MG 2.3 12/10/2020   No results found for: "VD25OH"  Lab Results  Component Value Date   PREALBUMIN 25 09/04/2022      Latest Ref Rng & Units 12/22/2022    8:07 AM 12/22/2022    7:57 AM 12/19/2022    8:50 AM  CBC EXTENDED  WBC 4.0 - 10.5 K/uL  14.5  8.7   RBC 4.22 - 5.81 MIL/uL  5.21  4.23   Hemoglobin 13.0 - 17.0 g/dL 01.0  27.2  53.6   HCT 39.0 - 52.0 % 46.0  43.9  36.8   Platelets 150 - 400 K/uL  299  253   NEUT# 1.7 - 7.7 K/uL   7.3   Lymph# 0.7 - 4.0 K/uL   0.8      There is no height or weight on file to calculate BMI.  Orders:  No orders of the defined types were placed in this encounter.  Meds ordered this encounter  Medications   oxyCODONE-acetaminophen (PERCOCET/ROXICET) 5-325 MG tablet    Sig: Take 1 tablet by mouth every 8 (eight) hours as needed for severe pain.    Dispense:  20 tablet    Refill:  0     Procedures: No procedures performed  Clinical Data: No  additional findings.  ROS:  All other systems negative, except as noted in the HPI. Review of Systems  Objective: Vital Signs: There were no vitals taken for this visit.  Specialty Comments:  No specialty comments available.  PMFS History: Patient Active Problem List   Diagnosis Date Noted   AKI (acute kidney injury) (HCC) 12/22/2022   Fall 11/14/2022   Pain of right lower extremity due to injury 11/14/2022   Diabetic ulcer of lower leg (HCC) 11/13/2022   Osteomyelitis (HCC) 11/13/2022   Dyslipidemia 10/18/2022   Cellulitis of right lower extremity 10/17/2022   Dehiscence of amputation stump of right lower extremity (HCC) 10/09/2022   Below-knee amputation of right lower extremity (HCC) 10/09/2022   Gangrene of right foot (HCC) 09/04/2022   History of below-knee amputation of right lower extremity (HCC) 09/04/2022   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) 08/23/2022   CAP (community acquired pneumonia) 12/28/2021   Constipation  12/28/2021   Pain in both lower extremities 07/03/2021   Peripheral neuropathy 07/03/2021   Acute metabolic encephalopathy 06/09/2021   Marijuana dependence (HCC) 06/09/2021   DNR (do not resuscitate) 06/09/2021   Chest pain 06/08/2021   Renal lesion 02/14/2021   Status post cryoablation 02/14/2021   Allergic rhinitis 12/08/2020   Preop pulmonary/respiratory exam 03/27/2020   Acute on chronic diastolic (congestive) heart failure (HCC) 01/22/2020   Abdominal pain 01/22/2020   Hypothyroidism 12/13/2019   DM2 (diabetes mellitus, type 2) (HCC) 12/13/2019   Chronic kidney disease, stage 3b (HCC) 12/13/2019   Acute bacterial bronchitis 12/13/2019   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   Typical atrial flutter (HCC)    Chronic respiratory failure with hypoxia (HCC) 05/23/2016   CAD (coronary artery disease) 05/23/2016   Persistent atrial fibrillation (HCC)    Atypical atrial flutter (HCC)    Visit for monitoring Tikosyn therapy 04/16/2016   Chronic diastolic CHF (congestive heart failure) (HCC) 02/03/2016   Cough 12/06/2015   Acute on chronic respiratory failure with hypoxia (HCC) 09/08/2015   Hallucinations 09/04/2015   Lymphadenopathy 09/04/2015   Ascending aortic aneurysm (HCC) 09/04/2015   Hypoxia 09/02/2015   S/P aortic valve replacement with bioprosthetic valve 09/02/2015   Pleural effusion 09/02/2015   S/P AVR 08/22/2015   GERD without esophagitis 09/20/2014   Intrinsic asthma 10/13/2013   Dyspnea 09/15/2013   Pulmonary hypertension (HCC) 09/15/2013   Shortness of breath 05/05/2013   Pacemaker-St.Jude 04/01/2012   Bradycardia 03/31/2012   Second degree AV block 03/31/2012   AV block, 1st degree 02/01/2012   PAF (paroxysmal atrial fibrillation) (HCC) 11/13/2011   Fatigue 11/13/2011   Essential hypertension 11/13/2011   Aortic stenosis 11/13/2011   Sleep apnea 11/13/2011   Past Medical History:  Diagnosis Date   Aortic stenosis, mild    Arthritis of hand    "just a  little bit in both hands" (03/31/2012)   Asthma    "little bit" (03/31/2012)   Atrial fibrillation (HCC)    dx '04; DCCV '04, placed on flecainide, failed DCCV 04/2010, flecainide stopped; s/p successful A.fib ablation 01/31/12   CHF (congestive heart failure) (HCC)    Controlled type 2 diabetes mellitus without complication, without long-term current use of insulin (HCC) 12/13/2019   COPD (chronic obstructive pulmonary disease) (HCC)    DJD (degenerative joint disease)    Fatty liver    Fibromyalgia    GERD (gastroesophageal reflux disease)    Hyperlipidemia    Hypertension    Hypothyroidism    S/P radiation   Left  atrial enlargement    LA size 45mm by echo 11/21/11   Mitral regurgitation    trivial   Obstructive sleep apnea    mild by sleep study 2013; pt stated he does not have a machine because "it wasn't bad enough for him to have one"   Pacemaker 03/31/2012   Pneumonia    last time 2023   Restless leg syndrome    Second degree AV block    Splenomegaly     Family History  Problem Relation Age of Onset   Heart failure Mother    Stroke Father    Neuropathy Neg Hx     Past Surgical History:  Procedure Laterality Date   AMPUTATION Right 08/23/2022   Procedure: RIGHT FOOT FIFTH RAY AMPUTATION;  Surgeon: [name], MD;  Location: [facility] OR;  Service: Orthopedics;  Laterality: Right;   AMPUTATION Right 09/04/2022   Procedure: RIGHT BELOW KNEE AMPUTATION;  Surgeon: [name], MD;  Location: [facility] OR;  Service: Orthopedics;  Laterality: Right;   AORTIC VALVE REPLACEMENT N/A 08/22/2015   Procedure: AORTIC VALVE REPLACEMENT (AVR);  Surgeon: Kerin Perna, MD;  Location: [facility] OR;  Service: Open Heart Surgery;  Laterality: N/A;   ATRIAL FIBRILLATION ABLATION  01/30/2012   PVI by Dr. Johney Frame   ATRIAL FIBRILLATION ABLATION N/A 01/31/2012   Procedure: ATRIAL FIBRILLATION ABLATION;  Surgeon: [name], MD;  Location: [facility] CATH LAB;  Service: Cardiovascular;  Laterality: N/A;   ATRIAL  FIBRILLATION ABLATION N/A 04/19/2020   Procedure: ATRIAL FIBRILLATION ABLATION;  Surgeon: [name], MD;  Location: [facility] INVASIVE CV LAB;  Service: Cardiovascular;  Laterality: N/A;   BACK SURGERY     X 3   BIOPSY  01/01/2022   Procedure: BIOPSY;  Surgeon: Kathi Der, MD;  Location: [facility] ENDOSCOPY;  Service: Gastroenterology;;   CARDIAC CATHETERIZATION N/A 08/09/2015   Procedure: Right/Left Heart Cath and Coronary Angiography;  Surgeon: Peter M Swaziland, MD;  Location: [facility] INVASIVE CV LAB;  Service: Cardiovascular;  Laterality: N/A;   CARDIAC CATHETERIZATION N/A 02/05/2016   Procedure: Right/Left Heart Cath and Coronary Angiography;  Surgeon: Corky Crafts, MD;  Location: [facility] INVASIVE CV LAB;  Service: Cardiovascular;  Laterality: N/A;   CARDIAC CATHETERIZATION N/A 04/17/2016   Procedure: Right Heart Cath;  Surgeon: Dolores Patty, MD;  Location: [facility] INVASIVE CV LAB;  Service: Cardiovascular;  Laterality: N/A;   CARDIOVASCULAR STRESS TEST  03/12/2002   EF 48%, NO EVIDENCE OF ISCHEMIA   CARDIOVERSION  01/2012; 03/31/2012   CARDIOVERSION N/A 02/25/2012   Procedure: CARDIOVERSION;  Surgeon: [name], MD;  Location: [facility] CATH LAB;  Service: Cardiovascular;  Laterality: N/A;   CARDIOVERSION N/A 03/31/2012   Procedure: CARDIOVERSION;  Surgeon: [name], MD;  Location: [facility] CATH LAB;  Service: Cardiovascular;  Laterality: N/A;   CARDIOVERSION N/A 11/23/2015   Procedure: CARDIOVERSION;  Surgeon: Laurey Morale, MD;  Location: [facility] ENDOSCOPY;  Service: Cardiovascular;  Laterality: N/A;   CARDIOVERSION N/A 04/08/2016   Procedure: CARDIOVERSION;  Surgeon: Dolores Patty, MD;  Location: [facility] ENDOSCOPY;  Service: Cardiovascular;  Laterality: N/A;   CARDIOVERSION N/A 09/14/2018   Procedure: CARDIOVERSION;  Surgeon: Wendall Stade, MD;  Location: [facility] ENDOSCOPY;  Service: Cardiovascular;  Laterality: N/A;   CARDIOVERSION N/A 10/19/2019   Procedure: CARDIOVERSION;  Surgeon: Dolores Patty, MD;  Location: [facility] ENDOSCOPY;  Service: Cardiovascular;  Laterality: N/A;   CARDIOVERSION N/A 02/25/2020   Procedure: CARDIOVERSION;  Surgeon: Pricilla Riffle, MD;  Location: [facility] ENDOSCOPY;  Service:  Cardiovascular;  Laterality: N/A;   CARDIOVERSION N/A 05/25/2020   Procedure: CARDIOVERSION;  Surgeon: Lewayne Bunting, MD;  Location: [facility] ENDOSCOPY;  Service: Cardiovascular;  Laterality: N/A;   CARDIOVERSION N/A 07/10/2020   Procedure: CARDIOVERSION;  Surgeon: Jake Bathe, MD;  Location: [facility] ENDOSCOPY;  Service: Cardiovascular;  Laterality: N/A;   CLIPPING OF ATRIAL APPENDAGE N/A 08/22/2015   Procedure: CLIPPING OF ATRIAL APPENDAGE;  Surgeon: Kerin Perna, MD;  Location: [facility] OR;  Service: Open Heart Surgery;  Laterality: N/A;   COLONOSCOPY WITH PROPOFOL N/A 01/01/2022   Procedure: COLONOSCOPY WITH PROPOFOL;  Surgeon: Kathi Der, MD;  Location: [facility] ENDOSCOPY;  Service: Gastroenterology;  Laterality: N/A;   DOPPLER ECHOCARDIOGRAPHY  03/11/2002   EF 70-75%   FINGER SURGERY Left    Middle finger   FINGER TENDON REPAIR  1980's   "right little finger" (03/31/2012)   FLEXIBLE SIGMOIDOSCOPY N/A 12/30/2021   Procedure: FLEXIBLE SIGMOIDOSCOPY;  Surgeon: Charlott Rakes, MD;  Location: [facility] ENDOSCOPY;  Service: Gastroenterology;  Laterality: N/A;   INSERT / REPLACE / REMOVE PACEMAKER     St Jude   IR RADIOLOGIST EVAL & MGMT  05/06/2019   IR RADIOLOGIST EVAL & MGMT  10/06/2019   IR RADIOLOGIST EVAL & MGMT  10/31/2020   IR RADIOLOGIST EVAL & MGMT  01/18/2021   IR RADIOLOGIST EVAL & MGMT  03/08/2021   IR RADIOLOGIST EVAL & MGMT  06/13/2021   KIDNEY SURGERY Left    LEFT AND RIGHT HEART CATHETERIZATION WITH CORONARY ANGIOGRAM N/A 10/27/2012   Procedure: LEFT AND RIGHT HEART CATHETERIZATION WITH CORONARY ANGIOGRAM;  Surgeon: Pamella Pert, MD;  Location: [facility] CATH LAB;  Service: Cardiovascular;  Laterality: N/A;   LUMBAR DISC SURGERY  1980's X2;  2000's   MAZE N/A 08/22/2015   Procedure: MAZE;   Surgeon: Kerin Perna, MD;  Location: [facility] OR;  Service: Open Heart Surgery;  Laterality: N/A;   PACEMAKER INSERTION  03/31/2012   STJ Accent DR pacemaker implanted by Dr Johney Frame   PERMANENT PACEMAKER INSERTION N/A 03/31/2012   Procedure: PERMANENT PACEMAKER INSERTION;  Surgeon: [name], MD;  Location: [facility] CATH LAB;  Service: Cardiovascular;  Laterality: N/A;   PPM GENERATOR CHANGEOUT N/A 08/08/2020   Procedure: PPM GENERATOR CHANGEOUT;  Surgeon: [name], MD;  Location: [facility] INVASIVE CV LAB;  Service: Cardiovascular;  Laterality: N/A;   RADIOLOGY WITH ANESTHESIA Left 02/14/2021   Procedure: RADIOLOGY WITH ANESTHESIA LEFT RENAL CRYOABLATION;  Surgeon: Irish Lack, MD;  Location: [facility] ORS;  Service: Radiology;  Laterality: Left;   STUMP REVISION Right 10/09/2022   Procedure: REVISION RIGHT BELOW KNEE AMPUTATION;  Surgeon: [name], MD;  Location: [facility] OR;  Service: Orthopedics;  Laterality: Right;   STUMP REVISION Right 11/16/2022   Procedure: REVISION RIGHT BELOW KNEE AMPUTATION;  Surgeon: [name], MD;  Location: [facility] OR;  Service: Orthopedics;  Laterality: Right;   TEE WITHOUT CARDIOVERSION  01/30/2012   Procedure: TRANSESOPHAGEAL ECHOCARDIOGRAM (TEE);  Surgeon: Laurey Morale, MD;  Location: [facility] ENDOSCOPY;  Service: Cardiovascular;  Laterality: N/A;  ablation next day   TEE WITHOUT CARDIOVERSION N/A 08/22/2015   Procedure: TRANSESOPHAGEAL ECHOCARDIOGRAM (TEE);  Surgeon: Kerin Perna, MD;  Location: [facility] OR;  Service: Open Heart Surgery;  Laterality: N/A;   TEE WITHOUT CARDIOVERSION N/A 10/19/2019   Procedure: TRANSESOPHAGEAL ECHOCARDIOGRAM (TEE);  Surgeon: Dolores Patty, MD;  Location[facility] ENDOSCOPY;  Service: Cardiovascular;  Laterality: N/A;   US ECHOCARDIOGRAPHY  08/07/2009   EF 55-60%   Social History  Occupational History   Occupation: retired  Tobacco Use   Smoking status: Former    Current packs/day: 0.00    Average packs/day: 1 pack/day for 30.2 years  (30.2 ttl pk-yrs)    Types: Cigarettes    Start date: 1963    Quit date: 07/18/1991    Years since quitting: 31.4    Passive exposure: Never   Smokeless tobacco: Never  Vaping Use   Vaping status: Never Used  Substance and Sexual Activity   Alcohol use: Not Currently    Comment: occasional   Drug use: Yes    Types: Marijuana    Comment: Marijuana- for nausea   Sexual activity: Not Currently

## 2023-01-06 ENCOUNTER — Emergency Department (HOSPITAL_COMMUNITY)
Admission: EM | Admit: 2023-01-06 | Discharge: 2023-01-07 | Disposition: A | Discharge status: Home / Self Care | Payer: Medicare Other | Attending: Student | Admitting: Student

## 2023-01-06 ENCOUNTER — Other Ambulatory Visit: Payer: Self-pay

## 2023-01-06 ENCOUNTER — Emergency Department (HOSPITAL_COMMUNITY): Payer: Medicare Other

## 2023-01-06 ENCOUNTER — Encounter (HOSPITAL_COMMUNITY): Payer: Self-pay

## 2023-01-06 DIAGNOSIS — I5032 Chronic diastolic (congestive) heart failure: Secondary | ICD-10-CM | POA: Diagnosis not present

## 2023-01-06 DIAGNOSIS — Z7901 Long term (current) use of anticoagulants: Secondary | ICD-10-CM | POA: Diagnosis not present

## 2023-01-06 DIAGNOSIS — Z87891 Personal history of nicotine dependence: Secondary | ICD-10-CM | POA: Insufficient documentation

## 2023-01-06 DIAGNOSIS — E1122 Type 2 diabetes mellitus with diabetic chronic kidney disease: Secondary | ICD-10-CM | POA: Insufficient documentation

## 2023-01-06 DIAGNOSIS — Z7951 Long term (current) use of inhaled steroids: Secondary | ICD-10-CM | POA: Diagnosis not present

## 2023-01-06 DIAGNOSIS — E039 Hypothyroidism, unspecified: Secondary | ICD-10-CM | POA: Insufficient documentation

## 2023-01-06 DIAGNOSIS — R1084 Generalized abdominal pain: Secondary | ICD-10-CM

## 2023-01-06 DIAGNOSIS — I2510 Atherosclerotic heart disease of native coronary artery without angina pectoris: Secondary | ICD-10-CM | POA: Insufficient documentation

## 2023-01-06 DIAGNOSIS — Z950 Presence of cardiac pacemaker: Secondary | ICD-10-CM | POA: Diagnosis not present

## 2023-01-06 DIAGNOSIS — K5900 Constipation, unspecified: Secondary | ICD-10-CM | POA: Insufficient documentation

## 2023-01-06 DIAGNOSIS — J45909 Unspecified asthma, uncomplicated: Secondary | ICD-10-CM | POA: Diagnosis not present

## 2023-01-06 DIAGNOSIS — I130 Hypertensive heart and chronic kidney disease with heart failure and stage 1 through stage 4 chronic kidney disease, or unspecified chronic kidney disease: Secondary | ICD-10-CM | POA: Insufficient documentation

## 2023-01-06 DIAGNOSIS — Z79899 Other long term (current) drug therapy: Secondary | ICD-10-CM | POA: Diagnosis not present

## 2023-01-06 DIAGNOSIS — J449 Chronic obstructive pulmonary disease, unspecified: Secondary | ICD-10-CM | POA: Diagnosis not present

## 2023-01-06 DIAGNOSIS — N1832 Chronic kidney disease, stage 3b: Secondary | ICD-10-CM | POA: Diagnosis not present

## 2023-01-06 DIAGNOSIS — I480 Paroxysmal atrial fibrillation: Secondary | ICD-10-CM | POA: Insufficient documentation

## 2023-01-06 DIAGNOSIS — R1110 Vomiting, unspecified: Secondary | ICD-10-CM

## 2023-01-06 DIAGNOSIS — R112 Nausea with vomiting, unspecified: Secondary | ICD-10-CM | POA: Diagnosis not present

## 2023-01-06 LAB — CBC WITH DIFFERENTIAL/PLATELET
Abs Immature Granulocytes: 0.04 10*3/uL (ref 0.00–0.07)
Basophils Absolute: 0 10*3/uL (ref 0.0–0.1)
Basophils Relative: 0 %
Eosinophils Absolute: 0 10*3/uL (ref 0.0–0.5)
Eosinophils Relative: 0 %
HCT: 33.2 % — ABNORMAL LOW (ref 39.0–52.0)
Hemoglobin: 10.2 g/dL — ABNORMAL LOW (ref 13.0–17.0)
Immature Granulocytes: 0 %
Lymphocytes Relative: 4 %
Lymphs Abs: 0.4 10*3/uL — ABNORMAL LOW (ref 0.7–4.0)
MCH: 26.4 pg (ref 26.0–34.0)
MCHC: 30.7 g/dL (ref 30.0–36.0)
MCV: 85.8 fL (ref 80.0–100.0)
Monocytes Absolute: 1.2 10*3/uL — ABNORMAL HIGH (ref 0.1–1.0)
Monocytes Relative: 11 %
Neutro Abs: 8.7 10*3/uL — ABNORMAL HIGH (ref 1.7–7.7)
Neutrophils Relative %: 85 %
Platelets: 162 10*3/uL (ref 150–400)
RBC: 3.87 MIL/uL — ABNORMAL LOW (ref 4.22–5.81)
RDW: 17.4 % — ABNORMAL HIGH (ref 11.5–15.5)
WBC: 10.4 10*3/uL (ref 4.0–10.5)
nRBC: 0 % (ref 0.0–0.2)

## 2023-01-06 LAB — COMPREHENSIVE METABOLIC PANEL
ALT: 17 U/L (ref 0–44)
AST: 21 U/L (ref 15–41)
Albumin: 3.8 g/dL (ref 3.5–5.0)
Alkaline Phosphatase: 65 U/L (ref 38–126)
Anion gap: 10 (ref 5–15)
BUN: 11 mg/dL (ref 8–23)
CO2: 24 mmol/L (ref 22–32)
Calcium: 9.7 mg/dL (ref 8.9–10.3)
Chloride: 103 mmol/L (ref 98–111)
Creatinine, Ser: 1.25 mg/dL — ABNORMAL HIGH (ref 0.61–1.24)
GFR, Estimated: >60 mL/min (ref >60)
Glucose, Bld: 141 mg/dL — ABNORMAL HIGH (ref 70–99)
Potassium: 3.9 mmol/L (ref 3.5–5.1)
Sodium: 137 mmol/L (ref 135–145)
Total Bilirubin: 1.3 mg/dL — ABNORMAL HIGH (ref 0.3–1.2)
Total Protein: 7.9 g/dL (ref 6.5–8.1)

## 2023-01-06 LAB — URINALYSIS, ROUTINE W REFLEX MICROSCOPIC
Bacteria, UA: NONE SEEN
Bilirubin Urine: NEGATIVE
Glucose, UA: >=500 mg/dL — AB
Hgb urine dipstick: NEGATIVE
Ketones, ur: 20 mg/dL — AB
Leukocytes,Ua: NEGATIVE
Nitrite: NEGATIVE
Protein, ur: >=300 mg/dL — AB
Specific Gravity, Urine: 1.021 (ref 1.005–1.030)
pH: 7 (ref 5.0–8.0)

## 2023-01-06 LAB — LIPASE, BLOOD: Lipase: 20 U/L (ref 11–51)

## 2023-01-06 MED ORDER — ONDANSETRON 4 MG PO TBDP
4.0000 mg | ORAL_TABLET | Freq: Once | ORAL | Status: AC | PRN
  Administered 2023-01-06: 4 mg via ORAL
  Filled 2023-01-06: qty 1

## 2023-01-06 MED ORDER — LACTATED RINGERS IV BOLUS
1000.0000 mL | Freq: Once | INTRAVENOUS | Status: AC
  Administered 2023-01-06: 1000 mL via INTRAVENOUS

## 2023-01-06 MED ORDER — DROPERIDOL 2.5 MG/ML IJ SOLN
1.2500 mg | Freq: Once | INTRAMUSCULAR | Status: AC
  Administered 2023-01-06: 1.25 mg via INTRAVENOUS
  Filled 2023-01-06: qty 2

## 2023-01-06 MED ORDER — MORPHINE SULFATE (PF) 4 MG/ML IV SOLN
4.0000 mg | Freq: Once | INTRAVENOUS | Status: AC
  Administered 2023-01-06: 4 mg via INTRAVENOUS
  Filled 2023-01-06: qty 1

## 2023-01-06 MED ORDER — IOHEXOL 350 MG/ML SOLN
75.0000 mL | Freq: Once | INTRAVENOUS | Status: AC | PRN
  Administered 2023-01-06: 75 mL via INTRAVENOUS

## 2023-01-06 MED ORDER — LORAZEPAM 2 MG/ML IJ SOLN
0.5000 mg | Freq: Once | INTRAMUSCULAR | Status: AC
  Administered 2023-01-06: 0.5 mg via INTRAVENOUS
  Filled 2023-01-06: qty 1

## 2023-01-06 NOTE — ED Notes (Signed)
Pt could not provide urine sample at this time, given urine cup.

## 2023-01-06 NOTE — ED Provider Triage Note (Signed)
Emergency Medicine Provider Triage Evaluation Note  [name] , a 72 y.o. male  was evaluated in triage.  Pt complains of generalized abdominal pain, nausea, and vomiting over the past several days.  LBM yesterday, reports stool was hard.  Denies fever.  Review of Systems  Positive: As above Negative: As above  Physical Exam  BP (!) 156/81 (BP Location: Right Arm)   Pulse (!) 58   Temp 100.2 F (37.9 C) (Oral)   Resp 19   SpO2 97%  Gen:   Awake, no distress   Resp:  Normal effort  MSK:   Moves extremities without difficulty  Other:  Abdomen with generalized tenderness  Medical Decision Making  Medically screening exam initiated at 2:59 PM.  Appropriate orders placed.  [name] was informed that the remainder of the evaluation will be completed by another provider, this initial triage assessment does not replace that evaluation, and the importance of remaining in the ED until their evaluation is complete.     Melton Alar R, PA-C 01/06/23 1504

## 2023-01-06 NOTE — ED Triage Notes (Signed)
Pt reports generalized abd pain and emesis for the past several days, last Bm yesterday but states it was hard.

## 2023-01-06 NOTE — ED Provider Notes (Signed)
11:38 PM Assumed care from Dr. Posey Rea, please see their note for full history, physical and decision making until this point. In brief this is a 72 y.o. year old male who presented to the ED tonight with Abdominal Pain and Emesis     Recurrent pain and emesis. Pending reeval and TSH/T4.  Labs okay.  Patient evaluated and still sleeping.  On reevaluation I woke him up and asked him how he was doing.  He stated that "I aint".  When I asked him to elaborate he looked at me with an abnormal look and closes eyes.  No emesis on my shift.  Allow him to wake up with a bit more from the droperidol but overall that the patient should be able to go home.  May need a little bit better bowel cleanout but do not see any indication for admission at this time.  Discharge instructions, including strict return precautions for new or worsening symptoms, given. Patient and/or family verbalized understanding and agreement with the plan as described.   Labs, studies and imaging reviewed by myself and considered in medical decision making if ordered. Imaging interpreted by radiology.  Labs Reviewed  COMPREHENSIVE METABOLIC PANEL - Abnormal; Notable for the following components:      Result Value   Glucose, Bld 141 (*)    Creatinine, Ser 1.25 (*)    Total Bilirubin 1.3 (*)    All other components within normal limits  URINALYSIS, ROUTINE W REFLEX MICROSCOPIC - Abnormal; Notable for the following components:   Glucose, UA >=500 (*)    Ketones, ur 20 (*)    Protein, ur >=300 (*)    All other components within normal limits  CBC WITH DIFFERENTIAL/PLATELET - Abnormal; Notable for the following components:   RBC 3.87 (*)    Hemoglobin 10.2 (*)    HCT 33.2 (*)    RDW 17.4 (*)    Neutro Abs 8.7 (*)    Lymphs Abs 0.4 (*)    Monocytes Absolute 1.2 (*)    All other components within normal limits  LIPASE, BLOOD  TSH  T4, FREE    CT ABDOMEN PELVIS W CONTRAST  Final Result      No follow-ups on file.     Deakon Frix, Barbara Cower, MD 01/07/23 [id number]

## 2023-01-06 NOTE — ED Notes (Signed)
Patient transported to CT 

## 2023-01-07 LAB — T4, FREE: Free T4: 2.04 ng/dL — ABNORMAL HIGH (ref 0.61–1.12)

## 2023-01-07 LAB — TSH: TSH: 2.592 u[IU]/mL (ref 0.350–4.500)

## 2023-01-07 MED ORDER — HYDROMORPHONE HCL 1 MG/ML IJ SOLN: 1.0000 mg | Freq: Once | INTRAMUSCULAR | Status: DC

## 2023-01-07 NOTE — ED Notes (Signed)
Assumed care of patient.

## 2023-01-07 NOTE — ED Notes (Signed)
[facility] arrived to transport pt home at time of DC. Pt verbalized understanding. All of belongings transported with patient at time of DC. VSS/NAD.

## 2023-01-07 NOTE — ED Provider Notes (Signed)
[place] EMERGENCY DEPARTMENT AT [facility] Provider Note  CSN: [id number] Arrival date & time: 01/06/23 1446  Chief Complaint(s) Abdominal Pain and Emesis  HPI [name] is a 72 y.o. male with PMH T2DM, CKD 3, hypothyroidism, paroxysmal A-fib and aortic valve replacement, right BKA, hypothyroidism who presents emergency department for evaluation of abdominal pain nausea and vomiting.  Patient recently discharged on 12/24/2022 for similar complaints and found to have symptoms primarily related to constipation.  Patient arrives with his wife who states that symptoms have been present for the last 48 to 72 hours and worsening.  Endorses single hard bowel movement yesterday.  History difficult to obtain as patient is not entirely forthcoming with his symptoms.  He is rolling around in the bed but is having a difficult time localizing where his pain truly is.  This behavior was witnessed by his wife who states that this is patient's baseline.   Past Medical History Past Medical History:  Diagnosis Date   Aortic stenosis, mild    Arthritis of hand    "just a little bit in both hands" (03/31/2012)   Asthma    "little bit" (03/31/2012)   Atrial fibrillation (HCC)    dx '04; DCCV '04, placed on flecainide, failed DCCV 04/2010, flecainide stopped; s/p successful A.fib ablation 01/31/12   CHF (congestive heart failure) (HCC)    Controlled type 2 diabetes mellitus without complication, without long-term current use of insulin (HCC) 12/13/2019   COPD (chronic obstructive pulmonary disease) (HCC)    DJD (degenerative joint disease)    Fatty liver    Fibromyalgia    GERD (gastroesophageal reflux disease)    Hyperlipidemia    Hypertension    Hypothyroidism    S/P radiation   Left atrial enlargement    LA size 45mm by echo 11/21/11   Mitral regurgitation    trivial   Obstructive sleep apnea    mild by sleep study 2013; pt stated he does not have a machine because "it wasn't bad  enough for him to have one"   Pacemaker 03/31/2012   Pneumonia    last time 2023   Restless leg syndrome    Second degree AV block    Splenomegaly    Patient Active Problem List   Diagnosis Date Noted   AKI (acute kidney injury) (HCC) 12/22/2022   Fall 11/14/2022   Pain of right lower extremity due to injury 11/14/2022   Diabetic ulcer of lower leg (HCC) 11/13/2022   Osteomyelitis (HCC) 11/13/2022   Dyslipidemia 10/18/2022   Cellulitis of right lower extremity 10/17/2022   Dehiscence of amputation stump of right lower extremity (HCC) 10/09/2022   Below-knee amputation of right lower extremity (HCC) 10/09/2022   Gangrene of right foot (HCC) 09/04/2022   History of below-knee amputation of right lower extremity (HCC) 09/04/2022   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) 08/23/2022   CAP (community acquired pneumonia) 12/28/2021   Constipation 12/28/2021   Pain in both lower extremities 07/03/2021   Peripheral neuropathy 07/03/2021   Acute metabolic encephalopathy 06/09/2021   Marijuana dependence (HCC) 06/09/2021   DNR (do not resuscitate) 06/09/2021   Chest pain 06/08/2021   Renal lesion 02/14/2021   Status post cryoablation 02/14/2021   Allergic rhinitis 12/08/2020   Preop pulmonary/respiratory exam 03/27/2020   Acute on chronic diastolic (congestive) heart failure (HCC) 01/22/2020   Abdominal pain 01/22/2020   Hypothyroidism 12/13/2019   DM2 (diabetes mellitus, type 2) (HCC) 12/13/2019   Chronic kidney disease,  stage 3b (HCC) 12/13/2019   Acute bacterial bronchitis 12/13/2019   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   Typical atrial flutter (HCC)    Chronic respiratory failure with hypoxia (HCC) 05/23/2016   CAD (coronary artery disease) 05/23/2016   Persistent atrial fibrillation (HCC)    Atypical atrial flutter (HCC)    Visit for monitoring Tikosyn therapy 04/16/2016   Chronic diastolic CHF (congestive heart failure) (HCC) 02/03/2016   Cough 12/06/2015   Acute on  chronic respiratory failure with hypoxia (HCC) 09/08/2015   Hallucinations 09/04/2015   Lymphadenopathy 09/04/2015   Ascending aortic aneurysm (HCC) 09/04/2015   Hypoxia 09/02/2015   S/P aortic valve replacement with bioprosthetic valve 09/02/2015   Pleural effusion 09/02/2015   S/P AVR 08/22/2015   GERD without esophagitis 09/20/2014   Intrinsic asthma 10/13/2013   Dyspnea 09/15/2013   Pulmonary hypertension (HCC) 09/15/2013   Shortness of breath 05/05/2013   Pacemaker-St.Jude 04/01/2012   Bradycardia 03/31/2012   Second degree AV block 03/31/2012   AV block, 1st degree 02/01/2012   PAF (paroxysmal atrial fibrillation) (HCC) 11/13/2011   Fatigue 11/13/2011   Essential hypertension 11/13/2011   Aortic stenosis 11/13/2011   Sleep apnea 11/13/2011   Home Medication(s) Prior to Admission medications   Medication Sig Start Date End Date Taking? Authorizing Provider  acetaminophen (TYLENOL) 325 MG tablet Take 2 tablets (650 mg total) by mouth every 6 (six) hours as needed. 12/24/22   Lockie Mola, MD  albuterol (PROVENTIL HFA;VENTOLIN HFA) 108 (90 Base) MCG/ACT inhaler Inhale 2 puffs into the lungs every 6 (six) hours as needed for wheezing or shortness of breath. 12/28/15   Leslye Peer, MD  amLODipine (NORVASC) 5 MG tablet Take 5 mg by mouth in the morning.    [provider]  apixaban (ELIQUIS) 5 MG TABS tablet Take 1 tablet (5 mg total) by mouth 2 (two) times daily. 08/21/22   Graciella Freer, PA-C  bisoprolol (ZEBETA) 5 MG tablet Take 1 tablet (5 mg total) by mouth daily. Please call office to arrange follow up appointment 11/29/22   Bensimhon, Bevelyn Buckles, MD  Budeson-Glycopyrrol-Formoterol (BREZTRI AEROSPHERE) 160-9-4.8 MCG/ACT AERO Inhale 2 puffs into the lungs in the morning and at bedtime. 05/23/22   Leslye Peer, MD  Cholecalciferol (VITAMIN D) 50 MCG (2000 UT) tablet Take 2 tablets (4,000 Units total) by mouth daily. 12/24/22   Lockie Mola, MD  empagliflozin  (JARDIANCE) 10 MG TABS tablet Take 1 tablet (10 mg total) by mouth daily. 05/27/22   Bensimhon, Bevelyn Buckles, MD  gabapentin (NEURONTIN) 300 MG capsule Take 2 capsules (600 mg total) by mouth 3 (three) times daily. for neuropathy 10/19/22   Rai, Delene Ruffini, MD  hydrOXYzine (ATARAX) 10 MG tablet TAKE 1 TABLET(10 MG) BY MOUTH THREE TIMES DAILY AS NEEDED 12/04/22   [name], MD  levothyroxine (SYNTHROID, LEVOTHROID) 175 MCG tablet Take 175 mcg by mouth daily before breakfast.    [provider]  Menthol-Camphor (TIGER BALM ARTHRITIS RUB EX) Apply 1 application topically as needed (for leg pain).    [provider]  montelukast (SINGULAIR) 10 MG tablet TAKE 1 TABLET(10 MG) BY MOUTH AT BEDTIME Patient taking differently: Take 10 mg by mouth daily. 06/27/22   Leslye Peer, MD  Multiple Minerals-Vitamins (CAL-MAG-ZINC-D) TABS Take 1 tablet by mouth daily.    [provider]  Multiple Vitamin (MULTIVITAMIN WITH MINERALS) TABS tablet Take 1 tablet by mouth daily. 12/15/19   Marguerita Merles Latif, DO  oxyCODONE-acetaminophen (PERCOCET/ROXICET) 5-325 MG  tablet Take 1 tablet by mouth every 8 (eight) hours as needed for severe pain. 12/31/22   [name], MD  OXYGEN Inhale 2 L/min into the lungs See admin instructions. 2 L/min at bedtime and 2 L/min throughout the day as needed for shortness of breath    [provider]  pantoprazole (PROTONIX) 40 MG tablet Take 40 mg by mouth daily before breakfast.    [provider]  Polyethyl Glyc-Propyl Glyc PF (SYSTANE ULTRA PF) 0.4-0.3 % SOLN Place 1 drop into both eyes 2 (two) times daily as needed (dry eyes).    [provider]  polyethylene glycol (MIRALAX / GLYCOLAX) 17 g packet Take 17 g by mouth 2 (two) times daily as needed. 12/19/22   Pricilla Loveless, MD  potassium chloride SA (KLOR-CON M) 20 MEQ tablet Take 20 mEq by mouth See admin instructions. Take 20 meq twice daily every other day    [provider]   predniSONE (DELTASONE) 2.5 MG tablet TAKE 1 TABLET(2.5 MG) BY MOUTH DAILY WITH BREAKFAST Patient taking differently: Take 2.5 mg by mouth daily with breakfast. 11/29/22   Byrum, Les Pou, MD  rOPINIRole (REQUIP) 1 MG tablet Take 1 tablet (1 mg total) by mouth in the morning and at bedtime. 01/23/20   Glade Lloyd, MD  rosuvastatin (CRESTOR) 10 MG tablet Take 10 mg by mouth at bedtime.  08/08/16   [provider]  senna-docusate (SENOKOT-S) 8.6-50 MG tablet Take 1 tablet by mouth 2 (two) times daily.    [provider]  spironolactone (ALDACTONE) 25 MG tablet Take 1 tablet (25 mg total) by mouth daily. 02/16/16   Graciella Freer, PA-C  torsemide (DEMADEX) 10 MG tablet Take 15 mg by mouth every other day.    [provider]                                                                                                                                    Past Surgical History Past Surgical History:  Procedure Laterality Date   AMPUTATION Right 08/23/2022   Procedure: RIGHT FOOT FIFTH RAY AMPUTATION;  Surgeon: [name], MD;  Location: [facility] OR;  Service: Orthopedics;  Laterality: Right;   AMPUTATION Right 09/04/2022   Procedure: RIGHT BELOW KNEE AMPUTATION;  Surgeon: [name], MD;  Location: [facility] OR;  Service: Orthopedics;  Laterality: Right;   AORTIC VALVE REPLACEMENT N/A 08/22/2015   Procedure: AORTIC VALVE REPLACEMENT (AVR);  Surgeon: Kerin Perna, MD;  Location: [facility] OR;  Service: Open Heart Surgery;  Laterality: N/A;   ATRIAL FIBRILLATION ABLATION  01/30/2012   PVI by Dr. Johney Frame   ATRIAL FIBRILLATION ABLATION N/A 01/31/2012   Procedure: ATRIAL FIBRILLATION ABLATION;  Surgeon: [name], MD;  Location: [facility] CATH LAB;  Service: Cardiovascular;  Laterality: N/A;   ATRIAL FIBRILLATION ABLATION N/A 04/19/2020   Procedure: ATRIAL FIBRILLATION ABLATION;  Surgeon: [name], MD;  Location: [facility] INVASIVE  CV LAB;  Service: Cardiovascular;  Laterality: N/A;    BACK SURGERY     X 3   BIOPSY  01/01/2022   Procedure: BIOPSY;  Surgeon: Kathi Der, MD;  Location: [facility] ENDOSCOPY;  Service: Gastroenterology;;   CARDIAC CATHETERIZATION N/A 08/09/2015   Procedure: Right/Left Heart Cath and Coronary Angiography;  Surgeon: Peter M Swaziland, MD;  Location: [facility] INVASIVE CV LAB;  Service: Cardiovascular;  Laterality: N/A;   CARDIAC CATHETERIZATION N/A 02/05/2016   Procedure: Right/Left Heart Cath and Coronary Angiography;  Surgeon: Corky Crafts, MD;  Location: [facility] INVASIVE CV LAB;  Service: Cardiovascular;  Laterality: N/A;   CARDIAC CATHETERIZATION N/A 04/17/2016   Procedure: Right Heart Cath;  Surgeon: Dolores Patty, MD;  Location: [facility] INVASIVE CV LAB;  Service: Cardiovascular;  Laterality: N/A;   CARDIOVASCULAR STRESS TEST  03/12/2002   EF 48%, NO EVIDENCE OF ISCHEMIA   CARDIOVERSION  01/2012; 03/31/2012   CARDIOVERSION N/A 02/25/2012   Procedure: CARDIOVERSION;  Surgeon: [name], MD;  Location: [facility] CATH LAB;  Service: Cardiovascular;  Laterality: N/A;   CARDIOVERSION N/A 03/31/2012   Procedure: CARDIOVERSION;  Surgeon: [name], MD;  Location: [facility] CATH LAB;  Service: Cardiovascular;  Laterality: N/A;   CARDIOVERSION N/A 11/23/2015   Procedure: CARDIOVERSION;  Surgeon: Laurey Morale, MD;  Location: [facility] ENDOSCOPY;  Service: Cardiovascular;  Laterality: N/A;   CARDIOVERSION N/A 04/08/2016   Procedure: CARDIOVERSION;  Surgeon: Dolores Patty, MD;  Location: [facility] ENDOSCOPY;  Service: Cardiovascular;  Laterality: N/A;   CARDIOVERSION N/A 09/14/2018   Procedure: CARDIOVERSION;  Surgeon: Wendall Stade, MD;  Location: [facility] ENDOSCOPY;  Service: Cardiovascular;  Laterality: N/A;   CARDIOVERSION N/A 10/19/2019   Procedure: CARDIOVERSION;  Surgeon: Dolores Patty, MD;  Location: [facility] ENDOSCOPY;  Service: Cardiovascular;  Laterality: N/A;   CARDIOVERSION N/A 02/25/2020   Procedure: CARDIOVERSION;  Surgeon: Pricilla Riffle, MD;  Location: [facility]  ENDOSCOPY;  Service: Cardiovascular;  Laterality: N/A;   CARDIOVERSION N/A 05/25/2020   Procedure: CARDIOVERSION;  Surgeon: Lewayne Bunting, MD;  Location: [facility] ENDOSCOPY;  Service: Cardiovascular;  Laterality: N/A;   CARDIOVERSION N/A 07/10/2020   Procedure: CARDIOVERSION;  Surgeon: Jake Bathe, MD;  Location: [facility] ENDOSCOPY;  Service: Cardiovascular;  Laterality: N/A;   CLIPPING OF ATRIAL APPENDAGE N/A 08/22/2015   Procedure: CLIPPING OF ATRIAL APPENDAGE;  Surgeon: Kerin Perna, MD;  Location: [facility] OR;  Service: Open Heart Surgery;  Laterality: N/A;   COLONOSCOPY WITH PROPOFOL N/A 01/01/2022   Procedure: COLONOSCOPY WITH PROPOFOL;  Surgeon: Kathi Der, MD;  Location: [facility] ENDOSCOPY;  Service: Gastroenterology;  Laterality: N/A;   DOPPLER ECHOCARDIOGRAPHY  03/11/2002   EF 70-75%   FINGER SURGERY Left    Middle finger   FINGER TENDON REPAIR  1980's   "right little finger" (03/31/2012)   FLEXIBLE SIGMOIDOSCOPY N/A 12/30/2021   Procedure: FLEXIBLE SIGMOIDOSCOPY;  Surgeon: Charlott Rakes, MD;  Location: [facility] ENDOSCOPY;  Service: Gastroenterology;  Laterality: N/A;   INSERT / REPLACE / REMOVE PACEMAKER     St Jude   IR RADIOLOGIST EVAL & MGMT  05/06/2019   IR RADIOLOGIST EVAL & MGMT  10/06/2019   IR RADIOLOGIST EVAL & MGMT  10/31/2020   IR RADIOLOGIST EVAL & MGMT  01/18/2021   IR RADIOLOGIST EVAL & MGMT  03/08/2021   IR RADIOLOGIST EVAL & MGMT  06/13/2021   KIDNEY SURGERY Left    LEFT AND RIGHT HEART CATHETERIZATION WITH CORONARY ANGIOGRAM N/A 10/27/2012   Procedure: LEFT AND RIGHT HEART CATHETERIZATION WITH CORONARY ANGIOGRAM;  Surgeon: Pamella Pert, MD;  Location: [facility] CATH LAB;  Service: Cardiovascular;  Laterality: N/A;   LUMBAR DISC SURGERY  1980's X2;  2000's   MAZE N/A 08/22/2015   Procedure: MAZE;  Surgeon: Kerin Perna, MD;  Location: [facility] OR;  Service: Open Heart Surgery;  Laterality: N/A;   PACEMAKER INSERTION  03/31/2012   STJ Accent DR pacemaker implanted by Dr Johney Frame    PERMANENT PACEMAKER INSERTION N/A 03/31/2012   Procedure: PERMANENT PACEMAKER INSERTION;  Surgeon: [name], MD;  Location: [facility] CATH LAB;  Service: Cardiovascular;  Laterality: N/A;   PPM GENERATOR CHANGEOUT N/A 08/08/2020   Procedure: PPM GENERATOR CHANGEOUT;  Surgeon: [name], MD;  Location: [facility] INVASIVE CV LAB;  Service: Cardiovascular;  Laterality: N/A;   RADIOLOGY WITH ANESTHESIA Left 02/14/2021   Procedure: RADIOLOGY WITH ANESTHESIA LEFT RENAL CRYOABLATION;  Surgeon: Irish Lack, MD;  Location: [facility] ORS;  Service: Radiology;  Laterality: Left;   STUMP REVISION Right 10/09/2022   Procedure: REVISION RIGHT BELOW KNEE AMPUTATION;  Surgeon: [name], MD;  Location: [facility] OR;  Service: Orthopedics;  Laterality: Right;   STUMP REVISION Right 11/16/2022   Procedure: REVISION RIGHT BELOW KNEE AMPUTATION;  Surgeon: [name], MD;  Location: [facility] OR;  Service: Orthopedics;  Laterality: Right;   TEE WITHOUT CARDIOVERSION  01/30/2012   Procedure: TRANSESOPHAGEAL ECHOCARDIOGRAM (TEE);  Surgeon: Laurey Morale, MD;  Location: [facility] ENDOSCOPY;  Service: Cardiovascular;  Laterality: N/A;  ablation next day   TEE WITHOUT CARDIOVERSION N/A 08/22/2015   Procedure: TRANSESOPHAGEAL ECHOCARDIOGRAM (TEE);  Surgeon: Kerin Perna, MD;  Location: [facility] OR;  Service: Open Heart Surgery;  Laterality: N/A;   TEE WITHOUT CARDIOVERSION N/A 10/19/2019   Procedure: TRANSESOPHAGEAL ECHOCARDIOGRAM (TEE);  Surgeon: Dolores Patty, MD;  Location: [facility] ENDOSCOPY;  Service: Cardiovascular;  Laterality: N/A;   US ECHOCARDIOGRAPHY  08/07/2009   EF 55-60%   Family History Family History  Problem Relation Age of Onset   Heart failure Mother    Stroke Father    Neuropathy Neg Hx     Social History Social History   Tobacco Use   Smoking status: Former    Current packs/day: 0.00    Average packs/day: 1 pack/day for 30.2 years (30.2 ttl pk-yrs)    Types: Cigarettes    Start date: 1963    Quit date:  07/18/1991    Years since quitting: 31.4    Passive exposure: Never   Smokeless tobacco: Never  Vaping Use   Vaping status: Never Used  Substance Use Topics   Alcohol use: Not Currently    Comment: occasional   Drug use: Yes    Types: Marijuana    Comment: Marijuana- for nausea   Allergies Cephalexin, Meloxicam, and Vancomycin  Review of Systems Review of Systems  Gastrointestinal:  Positive for abdominal pain, constipation, nausea and vomiting.    Physical Exam Vital Signs  I have reviewed the triage vital signs BP 118/63 (BP Location: Right Arm)   Pulse 83   Temp 98.1 F (36.7 C) (Oral)   Resp 18   Ht 5\' 11"  (1.803 m)   Wt 74.7 kg   SpO2 97%   BMI 22.97 kg/m   Physical Exam Constitutional:      General: He is not in acute distress.    Appearance: Normal appearance.  HENT:     Head: Normocephalic and atraumatic.     Nose: No congestion or rhinorrhea.  Eyes:     General:  Right eye: No discharge.        Left eye: No discharge.     Extraocular Movements: Extraocular movements intact.     Pupils: Pupils are equal, round, and reactive to light.  Cardiovascular:     Rate and Rhythm: Normal rate and regular rhythm.     Heart sounds: No murmur heard. Pulmonary:     Effort: No respiratory distress.     Breath sounds: No wheezing or rales.  Abdominal:     General: There is no distension.     Tenderness: There is generalized abdominal tenderness.  Musculoskeletal:        General: Normal range of motion.     Cervical back: Normal range of motion.  Skin:    General: Skin is warm and dry.  Neurological:     General: No focal deficit present.     Mental Status: He is alert.     ED Results and Treatments Labs (all labs ordered are listed, but only abnormal results are displayed) Labs Reviewed  COMPREHENSIVE METABOLIC PANEL - Abnormal; Notable for the following components:      Result Value   Glucose, Bld 141 (*)    Creatinine, Ser 1.25 (*)    Total  Bilirubin 1.3 (*)    All other components within normal limits  URINALYSIS, ROUTINE W REFLEX MICROSCOPIC - Abnormal; Notable for the following components:   Glucose, UA >=500 (*)    Ketones, ur 20 (*)    Protein, ur >=300 (*)    All other components within normal limits  CBC WITH DIFFERENTIAL/PLATELET - Abnormal; Notable for the following components:   RBC 3.87 (*)    Hemoglobin 10.2 (*)    HCT 33.2 (*)    RDW 17.4 (*)    Neutro Abs 8.7 (*)    Lymphs Abs 0.4 (*)    Monocytes Absolute 1.2 (*)    All other components within normal limits  T4, FREE - Abnormal; Notable for the following components:   Free T4 2.04 (*)    All other components within normal limits  LIPASE, BLOOD  TSH                                                                                                                          Radiology CT ABDOMEN PELVIS W CONTRAST  Result Date: 01/06/2023 CLINICAL DATA:  Epigastric abdominal pain with emesis. EXAM: CT ABDOMEN AND PELVIS WITH CONTRAST TECHNIQUE: Multidetector CT imaging of the abdomen and pelvis was performed using the standard protocol following bolus administration of intravenous contrast. RADIATION DOSE REDUCTION: This exam was performed according to the departmental dose-optimization program which includes automated exposure control, adjustment of the mA and/or kV according to patient size and/or use of iterative reconstruction technique. CONTRAST:  75mL OMNIPAQUE IOHEXOL 350 MG/ML SOLN COMPARISON:  12/22/2022 FINDINGS: Lower chest: Aortic valve replacement. Dual-chamber pacer leads in the right heart. Hepatobiliary: No focal liver abnormality.No evidence of biliary obstruction or stone. Pancreas: Unremarkable. Spleen:  Unremarkable. Adrenals/Urinary Tract: Negative adrenals. No hydronephrosis or stone. Right renal atrophy with cortical thinning sparing the posterior kidney where there is an accessory renal artery. Partially fatty lesion at the interpolar left kidney,  likely an ablation site, with no worrisome interval finding. Assessment of the kidneys is degraded by motion. Unremarkable bladder. Stomach/Bowel:  No obstruction. No appendicitis. Vascular/Lymphatic: No acute vascular abnormality. No mass or adenopathy. Reproductive:No pathologic findings. Other: No ascites or pneumoperitoneum.  Fatty left inguinal hernia. Musculoskeletal: No acute abnormalities. Marked atrophy of intrinsic back muscles. IMPRESSION: 1. No acute finding.  No bowel obstruction or visible inflammation. 2. Chronic findings are described above. Electronically Signed   By: Tiburcio Pea M.D.   On: 01/06/2023 22:58    Pertinent labs & imaging results that were available during my care of the patient were reviewed by me and considered in my medical decision making (see MDM for details).  Medications Ordered in ED Medications  ondansetron (ZOFRAN-ODT) disintegrating tablet 4 mg (4 mg Oral Given 01/06/23 2026)  morphine (PF) 4 MG/ML injection 4 mg (4 mg Intravenous Given 01/06/23 2027)  lactated ringers bolus 1,000 mL (0 mLs Intravenous Stopped 01/07/23 0120)  iohexol (OMNIPAQUE) 350 MG/ML injection 75 mL (75 mLs Intravenous Contrast Given 01/06/23 2121)  droperidol (INAPSINE) 2.5 MG/ML injection 1.25 mg (1.25 mg Intravenous Given 01/06/23 2326)  LORazepam (ATIVAN) injection 0.5 mg (0.5 mg Intravenous Given 01/06/23 2323)                                                                                                                                     Procedures Procedures  (including critical care time)  Medical Decision Making / ED Course   This patient presents to the ED for concern of abdominal pain, this involves an extensive number of treatment options, and is a complaint that carries with it a high risk of complications and morbidity.  The differential diagnosis includes diverticulitis, epiploic appendagitis, colitis, gastroenteritis, constipation, nephrolithiasis, inflammatory bowel  disease,   MDM: Patient seen emergency room for evaluation of abdominal pain.  Physical exam with generalized abdominal tenderness worse in the left lower quadrant.  Laboratory evaluation with a creatinine of 1.25, hemoglobin 10.2 but is otherwise unremarkable.  CT abdomen pelvis largely unremarkable.  Patient given morphine for pain control and on reevaluation symptoms not improved.  With negative CT scan, no significant evidence of large stool burden on CT, suspicion for possible gastroparesis or neuropathic pain and patient treated with droperidol and Ativan.  At time of signout, patient pending reevaluation by oncoming provider and follow-up on thyroid studies disease were grossly abnormal during his last hospital stay.  If symptoms improved suspect patient will likely be discharged.  Please see provider signout note for continuation of workup.   Additional history obtained: -Additional history obtained from wife -External records from outside source obtained and reviewed including: Chart review including previous notes, labs, imaging, consultation notes  Lab Tests: -I ordered, reviewed, and interpreted labs.   The pertinent results include:   Labs Reviewed  COMPREHENSIVE METABOLIC PANEL - Abnormal; Notable for the following components:      Result Value   Glucose, Bld 141 (*)    Creatinine, Ser 1.25 (*)    Total Bilirubin 1.3 (*)    All other components within normal limits  URINALYSIS, ROUTINE W REFLEX MICROSCOPIC - Abnormal; Notable for the following components:   Glucose, UA >=500 (*)    Ketones, ur 20 (*)    Protein, ur >=300 (*)    All other components within normal limits  CBC WITH DIFFERENTIAL/PLATELET - Abnormal; Notable for the following components:   RBC 3.87 (*)    Hemoglobin 10.2 (*)    HCT 33.2 (*)    RDW 17.4 (*)    Neutro Abs 8.7 (*)    Lymphs Abs 0.4 (*)    Monocytes Absolute 1.2 (*)    All other components within normal limits  T4, FREE - Abnormal; Notable  for the following components:   Free T4 2.04 (*)    All other components within normal limits  LIPASE, BLOOD  TSH      EKG   EKG Interpretation Date/Time:  Tuesday January 07 2023 06:19:33 EDT Ventricular Rate:  96 PR Interval:    QRS Duration:  102 QT Interval:  365 QTC Calculation: 462 R Axis:   56  Text Interpretation: ATRIAL PACED RHYTHM No significant change since last tracing Confirmed by Kirkland Figg ([phone]) on 01/07/2023 5:06:17 PM         Imaging Studies ordered: I ordered imaging studies including CT abdomen pelvis I independently visualized and interpreted imaging. I agree with the radiologist interpretation   Medicines ordered and prescription drug management: Meds ordered this encounter  Medications   ondansetron (ZOFRAN-ODT) disintegrating tablet 4 mg   morphine (PF) 4 MG/ML injection 4 mg   lactated ringers bolus 1,000 mL   iohexol (OMNIPAQUE) 350 MG/ML injection 75 mL   droperidol (INAPSINE) 2.5 MG/ML injection 1.25 mg   LORazepam (ATIVAN) injection 0.5 mg   DISCONTD: HYDROmorphone (DILAUDID) injection 1 mg    -I have reviewed the patients home medicines and have made adjustments as needed  Critical interventions none    Cardiac Monitoring: The patient was maintained on a cardiac monitor.  I personally viewed and interpreted the cardiac monitored which showed an underlying rhythm of: Paced rhythm  Social Determinants of Health:  Factors impacting patients care include: none   Reevaluation: After the interventions noted above, I reevaluated the patient and found that they have :stayed the same  Co morbidities that complicate the patient evaluation  Past Medical History:  Diagnosis Date   Aortic stenosis, mild    Arthritis of hand    "just a little bit in both hands" (03/31/2012)   Asthma    "little bit" (03/31/2012)   Atrial fibrillation (HCC)    dx '04; DCCV '04, placed on flecainide, failed DCCV 04/2010, flecainide stopped; s/p  successful A.fib ablation 01/31/12   CHF (congestive heart failure) (HCC)    Controlled type 2 diabetes mellitus without complication, without long-term current use of insulin (HCC) 12/13/2019   COPD (chronic obstructive pulmonary disease) (HCC)    DJD (degenerative joint disease)    Fatty liver    Fibromyalgia    GERD (gastroesophageal reflux disease)    Hyperlipidemia    Hypertension    Hypothyroidism    S/P radiation   Left atrial enlargement  LA size 45mm by echo 11/21/11   Mitral regurgitation    trivial   Obstructive sleep apnea    mild by sleep study 2013; pt stated he does not have a machine because "it wasn't bad enough for him to have one"   Pacemaker 03/31/2012   Pneumonia    last time 2023   Restless leg syndrome    Second degree AV block    Splenomegaly       Dispostion: I considered admission for this patient, and disposition pending reevaluation by oncoming provider and completion of thyroid studies.  Please see provider signout for continuation of workup.     Final Clinical Impression(s) / ED Diagnoses Final diagnoses:  Vomiting, unspecified vomiting type, unspecified whether nausea present  Generalized abdominal pain  Constipation, unspecified constipation type     @PCDICTATION @    Glendora Score, MD 01/07/23 1707

## 2023-01-14 ENCOUNTER — Ambulatory Visit: Payer: Medicare Other | Admitting: Orthopedic Surgery

## 2023-01-21 ENCOUNTER — Ambulatory Visit (INDEPENDENT_AMBULATORY_CARE_PROVIDER_SITE_OTHER): Payer: Medicare Other | Admitting: Orthopedic Surgery

## 2023-01-21 DIAGNOSIS — T8781 Dehiscence of amputation stump: Secondary | ICD-10-CM

## 2023-01-21 DIAGNOSIS — Z89511 Acquired absence of right leg below knee: Secondary | ICD-10-CM

## 2023-01-26 ENCOUNTER — Encounter: Payer: Self-pay | Admitting: Orthopedic Surgery

## 2023-01-26 NOTE — Progress Notes (Signed)
Office Visit Note   Patient: [name]           Date of Birth: February 28, 1951           MRN: [id number] Visit Date: 01/21/2023              Requested by: Alysia Penna, MD [address] [place],  [place] [address] PCP: Alysia Penna, MD  Chief Complaint  Patient presents with   Right Leg - Routine Post Op    10/09/2022 revision right BKA  11/16/2022 revision right BKA       HPI: Patient is a 72 year old gentleman who is status post revision right below-knee amputation 2 months ago.  He is currently in a shrinker.  Assessment & Plan: Visit Diagnoses:  1. S/P BKA (below knee amputation), right (HCC)   2. Dehiscence of amputation stump of right lower extremity (HCC)     Plan: Patient is provided a prescription for an extra-large shrinker for 3 wounds were debrided.  Follow-Up Instructions: Return in about 4 weeks (around 02/18/2023).   Ortho Exam  Patient is alert, oriented, no adenopathy, well-dressed, normal affect, normal respiratory effort. Examination patient has 3 wounds with fibrinous tissue.  These were debrided to healthy granulation tissue.  No cellulitis no drainage.  Imaging: No results found.   Labs: Lab Results  Component Value Date   HGBA1C 6.3 (H) 11/13/2022   HGBA1C 6.5 (H) 12/27/2021   HGBA1C 6.6 (H) 06/11/2021   ESRSEDRATE 60 (H) 09/04/2015   REPTSTATUS 10/22/2022 FINAL 10/17/2022   GRAMSTAIN NO WBC SEEN NO ORGANISMS SEEN  10/09/2022   CULT  10/17/2022    NO GROWTH 5 DAYS Performed at [facility], [address], [place], [place] [address]    [facility] STAPHYLOCOCCUS AUREUS 10/09/2022   LABORGA STAPHYLOCOCCUS EPIDERMIDIS 10/09/2022     Lab Results  Component Value Date   ALBUMIN 3.8 01/06/2023   ALBUMIN 4.7 12/22/2022   ALBUMIN 4.2 12/19/2022   PREALBUMIN 25 09/04/2022    Lab Results  Component Value Date   MG 2.4 12/23/2022   MG 2.0 06/11/2021   MG 2.3 12/10/2020   No results found for: "VD25OH"  Lab Results   Component Value Date   PREALBUMIN 25 09/04/2022      Latest Ref Rng & Units 01/06/2023    3:28 PM 12/22/2022    8:07 AM 12/22/2022    7:57 AM  CBC EXTENDED  WBC 4.0 - 10.5 K/uL 10.4   14.5   RBC 4.22 - 5.81 MIL/uL 3.87   5.21   Hemoglobin 13.0 - 17.0 g/dL 29.5  28.4  13.2   HCT 39.0 - 52.0 % 33.2  46.0  43.9   Platelets 150 - 400 K/uL 162   299   NEUT# 1.7 - 7.7 K/uL 8.7     Lymph# 0.7 - 4.0 K/uL 0.4        There is no height or weight on file to calculate BMI.  Orders:  No orders of the defined types were placed in this encounter.  No orders of the defined types were placed in this encounter.    Procedures: No procedures performed  Clinical Data: No additional findings.  ROS:  All other systems negative, except as noted in the HPI. Review of Systems  Objective: Vital Signs: There were no vitals taken for this visit.  Specialty Comments:  No specialty comments available.  PMFS History: Patient Active Problem List   Diagnosis Date Noted   AKI (acute  kidney injury) (HCC) 12/22/2022   Fall 11/14/2022   Pain of right lower extremity due to injury 11/14/2022   Diabetic ulcer of lower leg (HCC) 11/13/2022   Osteomyelitis (HCC) 11/13/2022   Dyslipidemia 10/18/2022   Cellulitis of right lower extremity 10/17/2022   Dehiscence of amputation stump of right lower extremity (HCC) 10/09/2022   Below-knee amputation of right lower extremity (HCC) 10/09/2022   Gangrene of right foot (HCC) 09/04/2022   History of below-knee amputation of right lower extremity (HCC) 09/04/2022   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) 08/23/2022   CAP (community acquired pneumonia) 12/28/2021   Constipation 12/28/2021   Pain in both lower extremities 07/03/2021   Peripheral neuropathy 07/03/2021   Acute metabolic encephalopathy 06/09/2021   Marijuana dependence (HCC) 06/09/2021   DNR (do not resuscitate) 06/09/2021   Chest pain 06/08/2021   Renal lesion 02/14/2021   Status post  cryoablation 02/14/2021   Allergic rhinitis 12/08/2020   Preop pulmonary/respiratory exam 03/27/2020   Acute on chronic diastolic (congestive) heart failure (HCC) 01/22/2020   Abdominal pain 01/22/2020   Hypothyroidism 12/13/2019   DM2 (diabetes mellitus, type 2) (HCC) 12/13/2019   Chronic kidney disease, stage 3b (HCC) 12/13/2019   Acute bacterial bronchitis 12/13/2019   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   Typical atrial flutter (HCC)    Chronic respiratory failure with hypoxia (HCC) 05/23/2016   CAD (coronary artery disease) 05/23/2016   Persistent atrial fibrillation (HCC)    Atypical atrial flutter (HCC)    Visit for monitoring Tikosyn therapy 04/16/2016   Chronic diastolic CHF (congestive heart failure) (HCC) 02/03/2016   Cough 12/06/2015   Acute on chronic respiratory failure with hypoxia (HCC) 09/08/2015   Hallucinations 09/04/2015   Lymphadenopathy 09/04/2015   Ascending aortic aneurysm (HCC) 09/04/2015   Hypoxia 09/02/2015   S/P aortic valve replacement with bioprosthetic valve 09/02/2015   Pleural effusion 09/02/2015   S/P AVR 08/22/2015   GERD without esophagitis 09/20/2014   Intrinsic asthma 10/13/2013   Dyspnea 09/15/2013   Pulmonary hypertension (HCC) 09/15/2013   Shortness of breath 05/05/2013   Pacemaker-St.Jude 04/01/2012   Bradycardia 03/31/2012   Second degree AV block 03/31/2012   AV block, 1st degree 02/01/2012   PAF (paroxysmal atrial fibrillation) (HCC) 11/13/2011   Fatigue 11/13/2011   Essential hypertension 11/13/2011   Aortic stenosis 11/13/2011   Sleep apnea 11/13/2011   Past Medical History:  Diagnosis Date   Aortic stenosis, mild    Arthritis of hand    "just a little bit in both hands" (03/31/2012)   Asthma    "little bit" (03/31/2012)   Atrial fibrillation (HCC)    dx '04; DCCV '04, placed on flecainide, failed DCCV 04/2010, flecainide stopped; s/p successful A.fib ablation 01/31/12   CHF (congestive heart failure) (HCC)    Controlled type  2 diabetes mellitus without complication, without long-term current use of insulin (HCC) 12/13/2019   COPD (chronic obstructive pulmonary disease) (HCC)    DJD (degenerative joint disease)    Fatty liver    Fibromyalgia    GERD (gastroesophageal reflux disease)    Hyperlipidemia    Hypertension    Hypothyroidism    S/P radiation   Left atrial enlargement    LA size 45mm by echo 11/21/11   Mitral regurgitation    trivial   Obstructive sleep apnea    mild by sleep study 2013; pt stated he does not have a machine because "it wasn't bad enough for him to have one"   Pacemaker 03/31/2012  Pneumonia    last time 2023   Restless leg syndrome    Second degree AV block    Splenomegaly     Family History  Problem Relation Age of Onset   Heart failure Mother    Stroke Father    Neuropathy Neg Hx     Past Surgical History:  Procedure Laterality Date   AMPUTATION Right 08/23/2022   Procedure: RIGHT FOOT FIFTH RAY AMPUTATION;  Surgeon: [name], MD;  Location: [facility] OR;  Service: Orthopedics;  Laterality: Right;   AMPUTATION Right 09/04/2022   Procedure: RIGHT BELOW KNEE AMPUTATION;  Surgeon: [name], MD;  Location: [facility] OR;  Service: Orthopedics;  Laterality: Right;   AORTIC VALVE REPLACEMENT N/A 08/22/2015   Procedure: AORTIC VALVE REPLACEMENT (AVR);  Surgeon: Kerin Perna, MD;  Location: [facility] OR;  Service: Open Heart Surgery;  Laterality: N/A;   ATRIAL FIBRILLATION ABLATION  01/30/2012   PVI by Dr. Johney Frame   ATRIAL FIBRILLATION ABLATION N/A 01/31/2012   Procedure: ATRIAL FIBRILLATION ABLATION;  Surgeon: [name], MD;  Location: [facility] CATH LAB;  Service: Cardiovascular;  Laterality: N/A;   ATRIAL FIBRILLATION ABLATION N/A 04/19/2020   Procedure: ATRIAL FIBRILLATION ABLATION;  Surgeon: [name], MD;  Location: [facility] INVASIVE CV LAB;  Service: Cardiovascular;  Laterality: N/A;   BACK SURGERY     X 3   BIOPSY  01/01/2022   Procedure: BIOPSY;  Surgeon: Kathi Der, MD;   Location: [facility] ENDOSCOPY;  Service: Gastroenterology;;   CARDIAC CATHETERIZATION N/A 08/09/2015   Procedure: Right/Left Heart Cath and Coronary Angiography;  Surgeon: Peter M Swaziland, MD;  Location: [facility] INVASIVE CV LAB;  Service: Cardiovascular;  Laterality: N/A;   CARDIAC CATHETERIZATION N/A 02/05/2016   Procedure: Right/Left Heart Cath and Coronary Angiography;  Surgeon: Corky Crafts, MD;  Location: [facility] INVASIVE CV LAB;  Service: Cardiovascular;  Laterality: N/A;   CARDIAC CATHETERIZATION N/A 04/17/2016   Procedure: Right Heart Cath;  Surgeon: Dolores Patty, MD;  Location: [facility] INVASIVE CV LAB;  Service: Cardiovascular;  Laterality: N/A;   CARDIOVASCULAR STRESS TEST  03/12/2002   EF 48%, NO EVIDENCE OF ISCHEMIA   CARDIOVERSION  01/2012; 03/31/2012   CARDIOVERSION N/A 02/25/2012   Procedure: CARDIOVERSION;  Surgeon: [name], MD;  Location: [facility] CATH LAB;  Service: Cardiovascular;  Laterality: N/A;   CARDIOVERSION N/A 03/31/2012   Procedure: CARDIOVERSION;  Surgeon: [name], MD;  Location: [facility] CATH LAB;  Service: Cardiovascular;  Laterality: N/A;   CARDIOVERSION N/A 11/23/2015   Procedure: CARDIOVERSION;  Surgeon: Laurey Morale, MD;  Location: [facility] ENDOSCOPY;  Service: Cardiovascular;  Laterality: N/A;   CARDIOVERSION N/A 04/08/2016   Procedure: CARDIOVERSION;  Surgeon: Dolores Patty, MD;  Location: [facility] ENDOSCOPY;  Service: Cardiovascular;  Laterality: N/A;   CARDIOVERSION N/A 09/14/2018   Procedure: CARDIOVERSION;  Surgeon: Wendall Stade, MD;  Location: [facility] ENDOSCOPY;  Service: Cardiovascular;  Laterality: N/A;   CARDIOVERSION N/A 10/19/2019   Procedure: CARDIOVERSION;  Surgeon: Dolores Patty, MD;  Location: [facility] ENDOSCOPY;  Service: Cardiovascular;  Laterality: N/A;   CARDIOVERSION N/A 02/25/2020   Procedure: CARDIOVERSION;  Surgeon: Pricilla Riffle, MD;  Location: [facility] ENDOSCOPY;  Service: Cardiovascular;  Laterality: N/A;   CARDIOVERSION N/A 05/25/2020   Procedure:  CARDIOVERSION;  Surgeon: Lewayne Bunting, MD;  Location: [facility] ENDOSCOPY;  Service: Cardiovascular;  Laterality: N/A;   CARDIOVERSION N/A 07/10/2020   Procedure: CARDIOVERSION;  Surgeon: Jake Bathe, MD;  Location: [facility] ENDOSCOPY;  Service: Cardiovascular;  Laterality: N/A;  CLIPPING OF ATRIAL APPENDAGE N/A 08/22/2015   Procedure: CLIPPING OF ATRIAL APPENDAGE;  Surgeon: Kerin Perna, MD;  Location: [facility] OR;  Service: Open Heart Surgery;  Laterality: N/A;   COLONOSCOPY WITH PROPOFOL N/A 01/01/2022   Procedure: COLONOSCOPY WITH PROPOFOL;  Surgeon: Kathi Der, MD;  Location: [facility] ENDOSCOPY;  Service: Gastroenterology;  Laterality: N/A;   DOPPLER ECHOCARDIOGRAPHY  03/11/2002   EF 70-75%   FINGER SURGERY Left    Middle finger   FINGER TENDON REPAIR  1980's   "right little finger" (03/31/2012)   FLEXIBLE SIGMOIDOSCOPY N/A 12/30/2021   Procedure: FLEXIBLE SIGMOIDOSCOPY;  Surgeon: Charlott Rakes, MD;  Location: [facility] ENDOSCOPY;  Service: Gastroenterology;  Laterality: N/A;   INSERT / REPLACE / REMOVE PACEMAKER     St Jude   IR RADIOLOGIST EVAL & MGMT  05/06/2019   IR RADIOLOGIST EVAL & MGMT  10/06/2019   IR RADIOLOGIST EVAL & MGMT  10/31/2020   IR RADIOLOGIST EVAL & MGMT  01/18/2021   IR RADIOLOGIST EVAL & MGMT  03/08/2021   IR RADIOLOGIST EVAL & MGMT  06/13/2021   KIDNEY SURGERY Left    LEFT AND RIGHT HEART CATHETERIZATION WITH CORONARY ANGIOGRAM N/A 10/27/2012   Procedure: LEFT AND RIGHT HEART CATHETERIZATION WITH CORONARY ANGIOGRAM;  Surgeon: Pamella Pert, MD;  Location: [facility] CATH LAB;  Service: Cardiovascular;  Laterality: N/A;   LUMBAR DISC SURGERY  1980's X2;  2000's   MAZE N/A 08/22/2015   Procedure: MAZE;  Surgeon: Kerin Perna, MD;  Location: [facility] OR;  Service: Open Heart Surgery;  Laterality: N/A;   PACEMAKER INSERTION  03/31/2012   STJ Accent DR pacemaker implanted by Dr Johney Frame   PERMANENT PACEMAKER INSERTION N/A 03/31/2012   Procedure: PERMANENT PACEMAKER INSERTION;  Surgeon:  [name], MD;  Location: [facility] CATH LAB;  Service: Cardiovascular;  Laterality: N/A;   PPM GENERATOR CHANGEOUT N/A 08/08/2020   Procedure: PPM GENERATOR CHANGEOUT;  Surgeon: [name], MD;  Location: [facility] INVASIVE CV LAB;  Service: Cardiovascular;  Laterality: N/A;   RADIOLOGY WITH ANESTHESIA Left 02/14/2021   Procedure: RADIOLOGY WITH ANESTHESIA LEFT RENAL CRYOABLATION;  Surgeon: Irish Lack, MD;  Location: [facility] ORS;  Service: Radiology;  Laterality: Left;   STUMP REVISION Right 10/09/2022   Procedure: REVISION RIGHT BELOW KNEE AMPUTATION;  Surgeon: [name], MD;  Location: [facility] OR;  Service: Orthopedics;  Laterality: Right;   STUMP REVISION Right 11/16/2022   Procedure: REVISION RIGHT BELOW KNEE AMPUTATION;  Surgeon: [name], MD;  Location: [facility] OR;  Service: Orthopedics;  Laterality: Right;   TEE WITHOUT CARDIOVERSION  01/30/2012   Procedure: TRANSESOPHAGEAL ECHOCARDIOGRAM (TEE);  Surgeon: Laurey Morale, MD;  Location: [facility] ENDOSCOPY;  Service: Cardiovascular;  Laterality: N/A;  ablation next day   TEE WITHOUT CARDIOVERSION N/A 08/22/2015   Procedure: TRANSESOPHAGEAL ECHOCARDIOGRAM (TEE);  Surgeon: Kerin Perna, MD;  Location: [facility] OR;  Service: Open Heart Surgery;  Laterality: N/A;   TEE WITHOUT CARDIOVERSION N/A 10/19/2019   Procedure: TRANSESOPHAGEAL ECHOCARDIOGRAM (TEE);  Surgeon: Dolores Patty, MD;  Location: [facility] ENDOSCOPY;  Service: Cardiovascular;  Laterality: N/A;   US ECHOCARDIOGRAPHY  08/07/2009   EF 55-60%   Social History   Occupational History   Occupation: retired  Tobacco Use   Smoking status: Former    Current packs/day: 0.00    Average packs/day: 1 pack/day for 30.2 years (30.2 ttl pk-yrs)    Types: Cigarettes    Start date: 1963    Quit date: 07/18/1991    Years since  quitting: 31.5    Passive exposure: Never   Smokeless tobacco: Never  Vaping Use   Vaping status: Never Used  Substance and Sexual Activity   Alcohol use: Not Currently     Comment: occasional   Drug use: Yes    Types: Marijuana    Comment: Marijuana- for nausea   Sexual activity: Not Currently

## 2023-01-31 ENCOUNTER — Encounter (HOSPITAL_COMMUNITY): Payer: Self-pay

## 2023-01-31 ENCOUNTER — Other Ambulatory Visit: Payer: Self-pay

## 2023-01-31 ENCOUNTER — Emergency Department (HOSPITAL_COMMUNITY): Payer: Medicare Other

## 2023-01-31 ENCOUNTER — Emergency Department (HOSPITAL_COMMUNITY)
Admission: EM | Admit: 2023-01-31 | Discharge: 2023-01-31 | Disposition: A | Discharge status: Home / Self Care | Payer: Medicare Other | Attending: Emergency Medicine | Admitting: Emergency Medicine

## 2023-01-31 DIAGNOSIS — I10 Essential (primary) hypertension: Secondary | ICD-10-CM | POA: Diagnosis not present

## 2023-01-31 DIAGNOSIS — R0602 Shortness of breath: Secondary | ICD-10-CM | POA: Diagnosis present

## 2023-01-31 DIAGNOSIS — Z7984 Long term (current) use of oral hypoglycemic drugs: Secondary | ICD-10-CM | POA: Insufficient documentation

## 2023-01-31 DIAGNOSIS — E039 Hypothyroidism, unspecified: Secondary | ICD-10-CM | POA: Diagnosis not present

## 2023-01-31 DIAGNOSIS — Z7901 Long term (current) use of anticoagulants: Secondary | ICD-10-CM | POA: Diagnosis not present

## 2023-01-31 DIAGNOSIS — R110 Nausea: Secondary | ICD-10-CM | POA: Insufficient documentation

## 2023-01-31 DIAGNOSIS — J810 Acute pulmonary edema: Secondary | ICD-10-CM | POA: Insufficient documentation

## 2023-01-31 DIAGNOSIS — Z79899 Other long term (current) drug therapy: Secondary | ICD-10-CM | POA: Diagnosis not present

## 2023-01-31 DIAGNOSIS — Z20822 Contact with and (suspected) exposure to covid-19: Secondary | ICD-10-CM | POA: Insufficient documentation

## 2023-01-31 DIAGNOSIS — E119 Type 2 diabetes mellitus without complications: Secondary | ICD-10-CM | POA: Insufficient documentation

## 2023-01-31 LAB — URINALYSIS, W/ REFLEX TO CULTURE (INFECTION SUSPECTED)
Bilirubin Urine: NEGATIVE
Glucose, UA: >=500 mg/dL — AB
Hgb urine dipstick: NEGATIVE
Ketones, ur: 5 mg/dL — AB
Leukocytes,Ua: NEGATIVE
Nitrite: NEGATIVE
Protein, ur: 100 mg/dL — AB
Specific Gravity, Urine: 1.011 (ref 1.005–1.030)
pH: 7 (ref 5.0–8.0)

## 2023-01-31 LAB — COMPREHENSIVE METABOLIC PANEL
ALT: 16 U/L (ref 0–44)
AST: 21 U/L (ref 15–41)
Albumin: 3.5 g/dL (ref 3.5–5.0)
Alkaline Phosphatase: 67 U/L (ref 38–126)
Anion gap: 13 (ref 5–15)
BUN: 13 mg/dL (ref 8–23)
CO2: 24 mmol/L (ref 22–32)
Calcium: 9.5 mg/dL (ref 8.9–10.3)
Chloride: 100 mmol/L (ref 98–111)
Creatinine, Ser: 1.35 mg/dL — ABNORMAL HIGH (ref 0.61–1.24)
GFR, Estimated: 55 mL/min — ABNORMAL LOW (ref >60)
Glucose, Bld: 133 mg/dL — ABNORMAL HIGH (ref 70–99)
Potassium: 4.2 mmol/L (ref 3.5–5.1)
Sodium: 137 mmol/L (ref 135–145)
Total Bilirubin: 1 mg/dL (ref 0.3–1.2)
Total Protein: 7.3 g/dL (ref 6.5–8.1)

## 2023-01-31 LAB — CBC
HCT: 35.6 % — ABNORMAL LOW (ref 39.0–52.0)
Hemoglobin: 10.5 g/dL — ABNORMAL LOW (ref 13.0–17.0)
MCH: 23.9 pg — ABNORMAL LOW (ref 26.0–34.0)
MCHC: 29.5 g/dL — ABNORMAL LOW (ref 30.0–36.0)
MCV: 80.9 fL (ref 80.0–100.0)
Platelets: 208 10*3/uL (ref 150–400)
RBC: 4.4 MIL/uL (ref 4.22–5.81)
RDW: 17.8 % — ABNORMAL HIGH (ref 11.5–15.5)
WBC: 10.4 10*3/uL (ref 4.0–10.5)
nRBC: 0 % (ref 0.0–0.2)

## 2023-01-31 LAB — RESP PANEL BY RT-PCR (RSV, FLU A&B, COVID)  RVPGX2
Influenza A by PCR: NEGATIVE
Influenza B by PCR: NEGATIVE
Resp Syncytial Virus by PCR: NEGATIVE
SARS Coronavirus 2 by RT PCR: NEGATIVE

## 2023-01-31 LAB — BRAIN NATRIURETIC PEPTIDE: B Natriuretic Peptide: 406 pg/mL — ABNORMAL HIGH (ref 0.0–100.0)

## 2023-01-31 LAB — TROPONIN I (HIGH SENSITIVITY)
Troponin I (High Sensitivity): 21 ng/L — ABNORMAL HIGH (ref <18)
Troponin I (High Sensitivity): 20 ng/L — ABNORMAL HIGH (ref <18)

## 2023-01-31 LAB — LIPASE, BLOOD: Lipase: 21 U/L (ref 11–51)

## 2023-01-31 MED ORDER — FUROSEMIDE 10 MG/ML IJ SOLN
20.0000 mg | Freq: Once | INTRAMUSCULAR | Status: AC
  Administered 2023-01-31: 20 mg via INTRAVENOUS
  Filled 2023-01-31: qty 2

## 2023-01-31 MED ORDER — OXYCODONE-ACETAMINOPHEN 5-325 MG PO TABS
1.0000 | ORAL_TABLET | Freq: Once | ORAL | Status: AC
  Administered 2023-01-31: 1 via ORAL
  Filled 2023-01-31: qty 1

## 2023-01-31 MED ORDER — ONDANSETRON HCL 4 MG/2ML IJ SOLN
4.0000 mg | Freq: Once | INTRAMUSCULAR | Status: AC
  Administered 2023-01-31: 4 mg via INTRAVENOUS
  Filled 2023-01-31: qty 2

## 2023-01-31 MED ORDER — GABAPENTIN 300 MG PO CAPS
300.0000 mg | ORAL_CAPSULE | Freq: Once | ORAL | Status: AC
  Administered 2023-01-31: 300 mg via ORAL
  Filled 2023-01-31: qty 1

## 2023-01-31 MED ORDER — IPRATROPIUM-ALBUTEROL 0.5-2.5 (3) MG/3ML IN SOLN
3.0000 mL | Freq: Once | RESPIRATORY_TRACT | Status: AC
  Administered 2023-01-31: 3 mL via RESPIRATORY_TRACT
  Filled 2023-01-31: qty 3

## 2023-01-31 MED ORDER — ALBUTEROL SULFATE HFA 108 (90 BASE) MCG/ACT IN AERS: 2.0000 | INHALATION_SPRAY | RESPIRATORY_TRACT | 0 refills | Status: DC | PRN

## 2023-01-31 MED ORDER — ONDANSETRON HCL 4 MG PO TABS: 4.0000 mg | ORAL_TABLET | Freq: Four times a day (QID) | ORAL | 0 refills | Status: AC

## 2023-01-31 NOTE — ED Triage Notes (Signed)
Pt c/o SOB and nauseax2d. Pt is tachypneic.

## 2023-01-31 NOTE — ED Provider Notes (Signed)
[place] EMERGENCY DEPARTMENT AT [facility] Provider Note   CSN: [id number] Arrival date & time: 01/31/23  1884     History  Chief Complaint  Patient presents with   Shortness of Breath    [name] is a 72 y.o. male.  Patient is a 72 year old male with a past medical history of emphysema, A-fib on Eliquis, pacemaker, hypertension, aortic valve replacement, hypothyroidism, diabetes presenting to the emergency department with nausea and shortness of breath.  The patient reports that he has been feeling sick for the last few days.  He states he has been nauseous with intermittent vomiting and decreased appetite.  He states that he has had a productive cough of mucus with shortness of breath.  He states that he has diffuse abdominal pain and some mild associated chest pain.  He denies any diarrhea or constipation.  He does endorse dysuria and hematuria.  His wife also reports that he is currently being worked up for anemia and possible GI bleed.  The history is provided by the patient and the spouse.  Shortness of Breath      Home Medications Prior to Admission medications   Medication Sig Start Date End Date Taking? Authorizing Provider  albuterol (VENTOLIN HFA) 108 (90 Base) MCG/ACT inhaler Inhale 2 puffs into the lungs every 4 (four) hours as needed for wheezing or shortness of breath. 01/31/23  Yes Theresia Lo, [place] K, DO  ondansetron (ZOFRAN) 4 MG tablet Take 1 tablet (4 mg total) by mouth every 6 (six) hours. 01/31/23  Yes Elayne Snare K, DO  acetaminophen (TYLENOL) 325 MG tablet Take 2 tablets (650 mg total) by mouth every 6 (six) hours as needed. 12/24/22   Lockie Mola, MD  albuterol (PROVENTIL HFA;VENTOLIN HFA) 108 (90 Base) MCG/ACT inhaler Inhale 2 puffs into the lungs every 6 (six) hours as needed for wheezing or shortness of breath. 12/28/15   Leslye Peer, MD  amLODipine (NORVASC) 5 MG tablet Take 5 mg by mouth in the morning.    [provider]  apixaban (ELIQUIS) 5 MG TABS tablet Take 1 tablet (5 mg total) by mouth 2 (two) times daily. 08/21/22   Graciella Freer, PA-C  bisoprolol (ZEBETA) 5 MG tablet Take 1 tablet (5 mg total) by mouth daily. Please call office to arrange follow up appointment 11/29/22   Bensimhon, Bevelyn Buckles, MD  Budeson-Glycopyrrol-Formoterol (BREZTRI AEROSPHERE) 160-9-4.8 MCG/ACT AERO Inhale 2 puffs into the lungs in the morning and at bedtime. 05/23/22   Leslye Peer, MD  Cholecalciferol (VITAMIN D) 50 MCG (2000 UT) tablet Take 2 tablets (4,000 Units total) by mouth daily. 12/24/22   Lockie Mola, MD  empagliflozin (JARDIANCE) 10 MG TABS tablet Take 1 tablet (10 mg total) by mouth daily. 05/27/22   Bensimhon, Bevelyn Buckles, MD  gabapentin (NEURONTIN) 300 MG capsule Take 2 capsules (600 mg total) by mouth 3 (three) times daily. for neuropathy 10/19/22   Rai, Delene Ruffini, MD  hydrOXYzine (ATARAX) 10 MG tablet TAKE 1 TABLET(10 MG) BY MOUTH THREE TIMES DAILY AS NEEDED 12/04/22   [name], MD  levothyroxine (SYNTHROID, LEVOTHROID) 175 MCG tablet Take 175 mcg by mouth daily before breakfast.    [provider]  Menthol-Camphor (TIGER BALM ARTHRITIS RUB EX) Apply 1 application topically as needed (for leg pain).    [provider]  montelukast (SINGULAIR) 10 MG tablet TAKE 1 TABLET(10 MG) BY MOUTH AT BEDTIME Patient taking differently: Take 10 mg by mouth daily. 06/27/22  Leslye Peer, MD  Multiple Minerals-Vitamins (CAL-MAG-ZINC-D) TABS Take 1 tablet by mouth daily.    [provider]  Multiple Vitamin (MULTIVITAMIN WITH MINERALS) TABS tablet Take 1 tablet by mouth daily. 12/15/19   Marguerita Merles Latif, DO  oxyCODONE-acetaminophen (PERCOCET/ROXICET) 5-325 MG tablet Take 1 tablet by mouth every 8 (eight) hours as needed for severe pain. 12/31/22   [name], MD  OXYGEN Inhale 2 L/min into the lungs See admin instructions. 2 L/min at bedtime and 2 L/min throughout the day as  needed for shortness of breath    [provider]  pantoprazole (PROTONIX) 40 MG tablet Take 40 mg by mouth daily before breakfast.    [provider]  Polyethyl Glyc-Propyl Glyc PF (SYSTANE ULTRA PF) 0.4-0.3 % SOLN Place 1 drop into both eyes 2 (two) times daily as needed (dry eyes).    [provider]  polyethylene glycol (MIRALAX / GLYCOLAX) 17 g packet Take 17 g by mouth 2 (two) times daily as needed. 12/19/22   Pricilla Loveless, MD  potassium chloride SA (KLOR-CON M) 20 MEQ tablet Take 20 mEq by mouth See admin instructions. Take 20 meq twice daily every other day    [provider]  predniSONE (DELTASONE) 2.5 MG tablet TAKE 1 TABLET(2.5 MG) BY MOUTH DAILY WITH BREAKFAST Patient taking differently: Take 2.5 mg by mouth daily with breakfast. 11/29/22   Byrum, Les Pou, MD  rOPINIRole (REQUIP) 1 MG tablet Take 1 tablet (1 mg total) by mouth in the morning and at bedtime. 01/23/20   Glade Lloyd, MD  rosuvastatin (CRESTOR) 10 MG tablet Take 10 mg by mouth at bedtime.  08/08/16   [provider]  senna-docusate (SENOKOT-S) 8.6-50 MG tablet Take 1 tablet by mouth 2 (two) times daily.    [provider]  spironolactone (ALDACTONE) 25 MG tablet Take 1 tablet (25 mg total) by mouth daily. 02/16/16   Graciella Freer, PA-C  torsemide (DEMADEX) 10 MG tablet Take 15 mg by mouth every other day.    [provider]      Allergies    Cephalexin, Meloxicam, and Vancomycin    Review of Systems   Review of Systems  Respiratory:  Positive for shortness of breath.     Physical Exam Updated Vital Signs BP 135/75   Pulse (!) 58   Temp 97.8 F (36.6 C) (Oral)   Resp 18   Ht 5\' 11"  (1.803 m)   Wt 74.7 kg   SpO2 99%   BMI 22.97 kg/m  Physical Exam Vitals and nursing note reviewed.  Constitutional:      General: He is not in acute distress.    Appearance: He is well-developed.  HENT:     Head: Normocephalic and atraumatic.      Mouth/Throat:     Mouth: Mucous membranes are moist.     Pharynx: Oropharynx is clear.  Eyes:     Extraocular Movements: Extraocular movements intact.  Cardiovascular:     Rate and Rhythm: Normal rate and regular rhythm.  Pulmonary:     Effort: Pulmonary effort is normal.     Breath sounds: Examination of the right-lower field reveals wheezing. Examination of the left-lower field reveals wheezing. Wheezing (trace, end-expiratory) present.  Abdominal:     General: Bowel sounds are normal.     Palpations: Abdomen is soft.     Tenderness: There is abdominal tenderness (diffuse). There is no guarding or rebound.  Musculoskeletal:  General: Normal range of motion.     Cervical back: Normal range of motion and neck supple.     Right lower leg: No edema.     Left lower leg: Edema (trace, non-pitting) present.     Comments: RLE BKA  Skin:    General: Skin is warm and dry.  Neurological:     General: No focal deficit present.     Mental Status: He is alert and oriented to person, place, and time.  Psychiatric:        Mood and Affect: Mood normal.        Behavior: Behavior normal.     ED Results / Procedures / Treatments   Labs (all labs ordered are listed, but only abnormal results are displayed) Labs Reviewed  COMPREHENSIVE METABOLIC PANEL - Abnormal; Notable for the following components:      Result Value   Glucose, Bld 133 (*)    Creatinine, Ser 1.35 (*)    GFR, Estimated 55 (*)    All other components within normal limits  CBC - Abnormal; Notable for the following components:   Hemoglobin 10.5 (*)    HCT 35.6 (*)    MCH 23.9 (*)    MCHC 29.5 (*)    RDW 17.8 (*)    All other components within normal limits  BRAIN NATRIURETIC PEPTIDE - Abnormal; Notable for the following components:   B Natriuretic Peptide 406.0 (*)    All other components within normal limits  URINALYSIS, W/ REFLEX TO CULTURE (INFECTION SUSPECTED) - Abnormal; Notable for the following components:    Glucose, UA >=500 (*)    Ketones, ur 5 (*)    Protein, ur 100 (*)    Bacteria, UA FEW (*)    All other components within normal limits  TROPONIN I (HIGH SENSITIVITY) - Abnormal; Notable for the following components:   Troponin I (High Sensitivity) 21 (*)    All other components within normal limits  TROPONIN I (HIGH SENSITIVITY) - Abnormal; Notable for the following components:   Troponin I (High Sensitivity) 20 (*)    All other components within normal limits  RESP PANEL BY RT-PCR (RSV, FLU A&B, COVID)  RVPGX2  LIPASE, BLOOD    EKG EKG Interpretation Date/Time:  Friday January 31 2023 08:34:46 EDT Ventricular Rate:  59 PR Interval:    QRS Duration:  202 QT Interval:  501 QTC Calculation: 497 R Axis:   -75  Text Interpretation: Atrial sensing, ventricular paced rhythm Left bundle branch block Now ventricular pacing compared to prior EKG Confirmed by Elayne Snare ([id number]) on 01/31/2023 8:40:46 AM  Radiology DG Chest Port 1 View  Result Date: 01/31/2023 CLINICAL DATA:  Shortness of breath EXAM: PORTABLE CHEST 1 VIEW COMPARISON:  Chest radiograph dated 12/19/2022 FINDINGS: Lines/tubes: Left chest wall pacemaker leads project over the right atrium and ventricle. Lungs: Well inflated lungs. Mild bilateral interstitial opacities. Pleura: No pneumothorax or pleural effusion. Heart/mediastinum: Similar cardiomediastinal silhouette status post aortic valve replacement. Similar position of atrial appendage clip paired Bones: Median sternotomy wires are nondisplaced. IMPRESSION: Mild bilateral interstitial opacities, which may represent pulmonary edema. Electronically Signed   By: Agustin Cree M.D.   On: 01/31/2023 10:02    Procedures Procedures    Medications Ordered in ED Medications  ondansetron (ZOFRAN) injection 4 mg (4 mg Intravenous Given 01/31/23 0902)  ipratropium-albuterol (DUONEB) 0.5-2.5 (3) MG/3ML nebulizer solution 3 mL (3 mLs Nebulization Given 01/31/23 0902)  gabapentin  (NEURONTIN) capsule 300 mg (300 mg Oral Given 01/31/23  1013)  oxyCODONE-acetaminophen (PERCOCET/ROXICET) 5-325 MG per tablet 1 tablet (1 tablet Oral Given 01/31/23 1013)  furosemide (LASIX) injection 20 mg (20 mg Intravenous Given 01/31/23 1014)    ED Course/ Medical Decision Making/ A&P Clinical Course as of 01/31/23 1331  Fri Jan 31, 2023  0956 Cr at baseline, mildly elevated BNP. Mild anemia stable. [VK]  1006 Mild interstitial opacities on CXR. Will be given IV lasix. Has missed a few doses of home torsemide. [VK]  1132 Few bacteria in the urine but no other signs of UTI. [VK]  1301 Repeat troponin flat.  Patient reports symptoms significantly improved.  He is stable for discharge home with outpatient follow-up. [VK]    Clinical Course User Index [VK] Rexford Maus, DO                                 Medical Decision Making This patient presents to the ED with chief complaint(s) of SOB, nausea with pertinent past medical history of emphysema, A-fib on Eliquis, aortic valve replacement, hypertension, hypothyroidism, diabetes which further complicates the presenting complaint. The complaint involves an extensive differential diagnosis and also carries with it a high risk of complications and morbidity.    The differential diagnosis includes viral syndrome, pneumonia, pneumothorax, pulmonary edema, pleural effusion, ACS, arrhythmia, dehydration, electrolyte abnormality, UTI, gastroenteritis, hepatitis, pancreatitis  Additional history obtained: Additional history obtained from spouse Records reviewed Care Everywhere/External Records and Primary Care Documents  ED Course and Reassessment: On patient's arrival he is hemodynamically stable in no acute distress.  EKG on arrival showed ventricular paced rhythm without acute ischemic changes.  Patient will have labs including troponin and BNP, chest x-ray and viral swab performed.  He will be given a DuoNeb and Zofran for symptomatic  management and will be closely reassessed.  Independent labs interpretation:  The following labs were independently interpreted: Mildly elevated BNP, otherwise labs at baseline  Independent visualization of imaging: - I independently visualized the following imaging with scope of interpretation limited to determining acute life threatening conditions related to emergency care: Chest x-ray, which revealed pulmonary edema  Consultation: - Consulted or discussed management/test interpretation w/ external professional: N/A  Consideration for admission or further workup: Patient has no emergent conditions requiring admission or further work-up at this time and is stable for discharge home with primary care follow-up  Social Determinants of health: N/a    Amount and/or Complexity of Data Reviewed Labs: ordered. Radiology: ordered.  Risk Prescription drug management.          Final Clinical Impression(s) / ED Diagnoses Final diagnoses:  Acute pulmonary edema (HCC)  Nausea    Rx / DC Orders ED Discharge Orders          Ordered    ondansetron (ZOFRAN) 4 MG tablet  Every 6 hours        01/31/23 1329    albuterol (VENTOLIN HFA) 108 (90 Base) MCG/ACT inhaler  Every 4 hours PRN        01/31/23 1329              [place], [place] K, DO 01/31/23 1331

## 2023-01-31 NOTE — Discharge Instructions (Signed)
You were seen in the emergency department for your shortness of breath and your nausea.  Your workup showed that you have extra fluid on your lungs likely from missing your torsemide.  We did give you a dose of Lasix through the IV here and you should take your torsemide in the next 2 days and then can return to taking it every other day.  You tested negative for COVID, flu and RSV but likely have another viral infection.  You can take Zofran as needed for nausea.  You should follow-up with your primary doctor in the next few days to have your symptoms rechecked.  You should return to the emergency department if you are having repetitive vomiting, worsening shortness of breath, fevers or if you have any other new or concerning symptoms.

## 2023-02-06 ENCOUNTER — Ambulatory Visit (INDEPENDENT_AMBULATORY_CARE_PROVIDER_SITE_OTHER): Payer: Medicare Other

## 2023-02-06 DIAGNOSIS — I442 Atrioventricular block, complete: Secondary | ICD-10-CM

## 2023-02-06 LAB — CUP PACEART REMOTE DEVICE CHECK
Battery Remaining Longevity: 110 mo
Battery Remaining Percentage: 81 %
Battery Voltage: 3.02 V
Brady Statistic RV Percent Paced: 99 %
Date Time Interrogation Session: 20241010040012
Implantable Lead Connection Status: 753985
Implantable Lead Connection Status: 753985
Implantable Lead Implant Date: 20131203
Implantable Lead Implant Date: 20131203
Implantable Lead Location: 753859
Implantable Lead Location: 753860
Implantable Lead Model: 1948
Implantable Pulse Generator Implant Date: 20220412
Lead Channel Impedance Value: 610 Ohm
Lead Channel Pacing Threshold Amplitude: 1.125 V
Lead Channel Pacing Threshold Pulse Width: 0.5 ms
Lead Channel Sensing Intrinsic Amplitude: 10.8 mV
Lead Channel Setting Pacing Amplitude: 1.375
Lead Channel Setting Pacing Pulse Width: 0.5 ms
Lead Channel Setting Sensing Sensitivity: 4 mV
Pulse Gen Model: 2272
Pulse Gen Serial Number: 3919039

## 2023-02-18 ENCOUNTER — Ambulatory Visit: Payer: Medicare Other | Admitting: Orthopedic Surgery

## 2023-02-18 ENCOUNTER — Encounter: Payer: Self-pay | Admitting: Orthopedic Surgery

## 2023-02-18 DIAGNOSIS — Z89511 Acquired absence of right leg below knee: Secondary | ICD-10-CM

## 2023-02-18 NOTE — Progress Notes (Signed)
Office Visit Note   Patient: [name]           Date of Birth: 07/29/1950           MRN: [id number] Visit Date: 02/18/2023              Requested by: Alysia Penna, MD [address] [place],  [place] [address] PCP: Alysia Penna, MD  Chief Complaint  Patient presents with   Right Leg - Follow-up    10/09/2022 revision right BKA  11/16/2022 revision right BKA         HPI: Patient is 4 months status post right transtibial amputation.  He is wearing an Warehouse manager.  Assessment & Plan: Visit Diagnoses:  1. S/P BKA (below knee amputation), right (HCC)     Plan: Patient is provided prescription for [employer] to get a size large shrinker.  Anticipate following up with [employer] in 4 weeks for casting for prosthesis.  Follow-Up Instructions: Return in about 2 months (around 04/20/2023).   Ortho Exam  Patient is alert, oriented, no adenopathy, well-dressed, normal affect, normal respiratory effort. Examination patient has increased swelling of the residual limb.  There is pitting edema no cellulitis he has 3 superficial ulcers that are 1 cm in diameter.  There is some retained sutures that are removed.  Imaging: No results found.   Labs: Lab Results  Component Value Date   HGBA1C 6.3 (H) 11/13/2022   HGBA1C 6.5 (H) 12/27/2021   HGBA1C 6.6 (H) 06/11/2021   ESRSEDRATE 60 (H) 09/04/2015   REPTSTATUS 10/22/2022 FINAL 10/17/2022   GRAMSTAIN NO WBC SEEN NO ORGANISMS SEEN  10/09/2022   CULT  10/17/2022    NO GROWTH 5 DAYS Performed at [facility], [address] [place], [place] [address]    [facility] STAPHYLOCOCCUS AUREUS 10/09/2022   LABORGA STAPHYLOCOCCUS EPIDERMIDIS 10/09/2022     Lab Results  Component Value Date   ALBUMIN 3.5 01/31/2023   ALBUMIN 3.8 01/06/2023   ALBUMIN 4.7 12/22/2022   PREALBUMIN 25 09/04/2022    Lab Results  Component Value Date   MG 2.4 12/23/2022   MG 2.0 06/11/2021   MG 2.3 12/10/2020   No results found for:  "VD25OH"  Lab Results  Component Value Date   PREALBUMIN 25 09/04/2022      Latest Ref Rng & Units 01/31/2023    8:50 AM 01/06/2023    3:28 PM 12/22/2022    8:07 AM  CBC EXTENDED  WBC 4.0 - 10.5 K/uL 10.4  10.4    RBC 4.22 - 5.81 MIL/uL 4.40  3.87    Hemoglobin 13.0 - 17.0 g/dL 86.5  78.4  69.6   HCT 39.0 - 52.0 % 35.6  33.2  46.0   Platelets 150 - 400 K/uL 208  162    NEUT# 1.7 - 7.7 K/uL  8.7    Lymph# 0.7 - 4.0 K/uL  0.4       There is no height or weight on file to calculate BMI.  Orders:  No orders of the defined types were placed in this encounter.  No orders of the defined types were placed in this encounter.    Procedures: No procedures performed  Clinical Data: No additional findings.  ROS:  All other systems negative, except as noted in the HPI. Review of Systems  Objective: Vital Signs: There were no vitals taken for this visit.  Specialty Comments:  No specialty comments available.  PMFS History: Patient Active Problem List  Diagnosis Date Noted   AKI (acute kidney injury) (HCC) 12/22/2022   Fall 11/14/2022   Pain of right lower extremity due to injury 11/14/2022   Diabetic ulcer of lower leg (HCC) 11/13/2022   Osteomyelitis (HCC) 11/13/2022   Dyslipidemia 10/18/2022   Cellulitis of right lower extremity 10/17/2022   Dehiscence of amputation stump of right lower extremity (HCC) 10/09/2022   Below-knee amputation of right lower extremity (HCC) 10/09/2022   Gangrene of right foot (HCC) 09/04/2022   History of below-knee amputation of right lower extremity (HCC) 09/04/2022   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) 08/23/2022   CAP (community acquired pneumonia) 12/28/2021   Constipation 12/28/2021   Pain in both lower extremities 07/03/2021   Peripheral neuropathy 07/03/2021   Acute metabolic encephalopathy 06/09/2021   Marijuana dependence (HCC) 06/09/2021   DNR (do not resuscitate) 06/09/2021   Chest pain 06/08/2021   Renal lesion  02/14/2021   Status post cryoablation 02/14/2021   Allergic rhinitis 12/08/2020   Preop pulmonary/respiratory exam 03/27/2020   Acute on chronic diastolic (congestive) heart failure (HCC) 01/22/2020   Abdominal pain 01/22/2020   Hypothyroidism 12/13/2019   DM2 (diabetes mellitus, type 2) (HCC) 12/13/2019   Chronic kidney disease, stage 3b (HCC) 12/13/2019   Acute bacterial bronchitis 12/13/2019   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   Typical atrial flutter (HCC)    Chronic respiratory failure with hypoxia (HCC) 05/23/2016   CAD (coronary artery disease) 05/23/2016   Persistent atrial fibrillation (HCC)    Atypical atrial flutter (HCC)    Visit for monitoring Tikosyn therapy 04/16/2016   Chronic diastolic CHF (congestive heart failure) (HCC) 02/03/2016   Cough 12/06/2015   Acute on chronic respiratory failure with hypoxia (HCC) 09/08/2015   Hallucinations 09/04/2015   Lymphadenopathy 09/04/2015   Ascending aortic aneurysm (HCC) 09/04/2015   Hypoxia 09/02/2015   S/P aortic valve replacement with bioprosthetic valve 09/02/2015   Pleural effusion 09/02/2015   S/P AVR 08/22/2015   GERD without esophagitis 09/20/2014   Intrinsic asthma 10/13/2013   Dyspnea 09/15/2013   Pulmonary hypertension (HCC) 09/15/2013   Shortness of breath 05/05/2013   Pacemaker-St.Jude 04/01/2012   Bradycardia 03/31/2012   Second degree AV block 03/31/2012   AV block, 1st degree 02/01/2012   PAF (paroxysmal atrial fibrillation) (HCC) 11/13/2011   Fatigue 11/13/2011   Essential hypertension 11/13/2011   Aortic stenosis 11/13/2011   Sleep apnea 11/13/2011   Past Medical History:  Diagnosis Date   Aortic stenosis, mild    Arthritis of hand    "just a little bit in both hands" (03/31/2012)   Asthma    "little bit" (03/31/2012)   Atrial fibrillation (HCC)    dx '04; DCCV '04, placed on flecainide, failed DCCV 04/2010, flecainide stopped; s/p successful A.fib ablation 01/31/12   CHF (congestive heart failure)  (HCC)    Controlled type 2 diabetes mellitus without complication, without long-term current use of insulin (HCC) 12/13/2019   COPD (chronic obstructive pulmonary disease) (HCC)    DJD (degenerative joint disease)    Fatty liver    Fibromyalgia    GERD (gastroesophageal reflux disease)    Hyperlipidemia    Hypertension    Hypothyroidism    S/P radiation   Left atrial enlargement    LA size 45mm by echo 11/21/11   Mitral regurgitation    trivial   Obstructive sleep apnea    mild by sleep study 2013; pt stated he does not have a machine because "it wasn't bad enough for him  to have one"   Pacemaker 03/31/2012   Pneumonia    last time 2023   Restless leg syndrome    Second degree AV block    Splenomegaly     Family History  Problem Relation Age of Onset   Heart failure Mother    Stroke Father    Neuropathy Neg Hx     Past Surgical History:  Procedure Laterality Date   AMPUTATION Right 08/23/2022   Procedure: RIGHT FOOT FIFTH RAY AMPUTATION;  Surgeon: [name], MD;  Location: [facility] OR;  Service: Orthopedics;  Laterality: Right;   AMPUTATION Right 09/04/2022   Procedure: RIGHT BELOW KNEE AMPUTATION;  Surgeon: [name], MD;  Location: [facility] OR;  Service: Orthopedics;  Laterality: Right;   AORTIC VALVE REPLACEMENT N/A 08/22/2015   Procedure: AORTIC VALVE REPLACEMENT (AVR);  Surgeon: Kerin Perna, MD;  Location: [facility] OR;  Service: Open Heart Surgery;  Laterality: N/A;   ATRIAL FIBRILLATION ABLATION  01/30/2012   PVI by Dr. Johney Frame   ATRIAL FIBRILLATION ABLATION N/A 01/31/2012   Procedure: ATRIAL FIBRILLATION ABLATION;  Surgeon: [name], MD;  Location: [facility] CATH LAB;  Service: Cardiovascular;  Laterality: N/A;   ATRIAL FIBRILLATION ABLATION N/A 04/19/2020   Procedure: ATRIAL FIBRILLATION ABLATION;  Surgeon: [name], MD;  Location: [facility] INVASIVE CV LAB;  Service: Cardiovascular;  Laterality: N/A;   BACK SURGERY     X 3   BIOPSY  01/01/2022   Procedure: BIOPSY;  Surgeon:  Kathi Der, MD;  Location: [facility] ENDOSCOPY;  Service: Gastroenterology;;   CARDIAC CATHETERIZATION N/A 08/09/2015   Procedure: Right/Left Heart Cath and Coronary Angiography;  Surgeon: Peter M Swaziland, MD;  Location: [facility] INVASIVE CV LAB;  Service: Cardiovascular;  Laterality: N/A;   CARDIAC CATHETERIZATION N/A 02/05/2016   Procedure: Right/Left Heart Cath and Coronary Angiography;  Surgeon: Corky Crafts, MD;  Location: [facility] INVASIVE CV LAB;  Service: Cardiovascular;  Laterality: N/A;   CARDIAC CATHETERIZATION N/A 04/17/2016   Procedure: Right Heart Cath;  Surgeon: Dolores Patty, MD;  Location: [facility] INVASIVE CV LAB;  Service: Cardiovascular;  Laterality: N/A;   CARDIOVASCULAR STRESS TEST  03/12/2002   EF 48%, NO EVIDENCE OF ISCHEMIA   CARDIOVERSION  01/2012; 03/31/2012   CARDIOVERSION N/A 02/25/2012   Procedure: CARDIOVERSION;  Surgeon: [name], MD;  Location: [facility] CATH LAB;  Service: Cardiovascular;  Laterality: N/A;   CARDIOVERSION N/A 03/31/2012   Procedure: CARDIOVERSION;  Surgeon: [name], MD;  Location: [facility] CATH LAB;  Service: Cardiovascular;  Laterality: N/A;   CARDIOVERSION N/A 11/23/2015   Procedure: CARDIOVERSION;  Surgeon: Laurey Morale, MD;  Location: [facility] ENDOSCOPY;  Service: Cardiovascular;  Laterality: N/A;   CARDIOVERSION N/A 04/08/2016   Procedure: CARDIOVERSION;  Surgeon: Dolores Patty, MD;  Location: [facility] ENDOSCOPY;  Service: Cardiovascular;  Laterality: N/A;   CARDIOVERSION N/A 09/14/2018   Procedure: CARDIOVERSION;  Surgeon: Wendall Stade, MD;  Location: [facility] ENDOSCOPY;  Service: Cardiovascular;  Laterality: N/A;   CARDIOVERSION N/A 10/19/2019   Procedure: CARDIOVERSION;  Surgeon: Dolores Patty, MD;  Location: [facility] ENDOSCOPY;  Service: Cardiovascular;  Laterality: N/A;   CARDIOVERSION N/A 02/25/2020   Procedure: CARDIOVERSION;  Surgeon: Pricilla Riffle, MD;  Location: [facility] ENDOSCOPY;  Service: Cardiovascular;  Laterality: N/A;   CARDIOVERSION N/A  05/25/2020   Procedure: CARDIOVERSION;  Surgeon: Lewayne Bunting, MD;  Location: [facility] ENDOSCOPY;  Service: Cardiovascular;  Laterality: N/A;   CARDIOVERSION N/A 07/10/2020   Procedure: CARDIOVERSION;  Surgeon: Jake Bathe, MD;  Location: [facility]  ENDOSCOPY;  Service: Cardiovascular;  Laterality: N/A;   CLIPPING OF ATRIAL APPENDAGE N/A 08/22/2015   Procedure: CLIPPING OF ATRIAL APPENDAGE;  Surgeon: Kerin Perna, MD;  Location: [facility] OR;  Service: Open Heart Surgery;  Laterality: N/A;   COLONOSCOPY WITH PROPOFOL N/A 01/01/2022   Procedure: COLONOSCOPY WITH PROPOFOL;  Surgeon: Kathi Der, MD;  Location: [facility] ENDOSCOPY;  Service: Gastroenterology;  Laterality: N/A;   DOPPLER ECHOCARDIOGRAPHY  03/11/2002   EF 70-75%   FINGER SURGERY Left    Middle finger   FINGER TENDON REPAIR  1980's   "right little finger" (03/31/2012)   FLEXIBLE SIGMOIDOSCOPY N/A 12/30/2021   Procedure: FLEXIBLE SIGMOIDOSCOPY;  Surgeon: Charlott Rakes, MD;  Location: [facility] ENDOSCOPY;  Service: Gastroenterology;  Laterality: N/A;   INSERT / REPLACE / REMOVE PACEMAKER     St Jude   IR RADIOLOGIST EVAL & MGMT  05/06/2019   IR RADIOLOGIST EVAL & MGMT  10/06/2019   IR RADIOLOGIST EVAL & MGMT  10/31/2020   IR RADIOLOGIST EVAL & MGMT  01/18/2021   IR RADIOLOGIST EVAL & MGMT  03/08/2021   IR RADIOLOGIST EVAL & MGMT  06/13/2021   KIDNEY SURGERY Left    LEFT AND RIGHT HEART CATHETERIZATION WITH CORONARY ANGIOGRAM N/A 10/27/2012   Procedure: LEFT AND RIGHT HEART CATHETERIZATION WITH CORONARY ANGIOGRAM;  Surgeon: Pamella Pert, MD;  Location: [facility] CATH LAB;  Service: Cardiovascular;  Laterality: N/A;   LUMBAR DISC SURGERY  1980's X2;  2000's   MAZE N/A 08/22/2015   Procedure: MAZE;  Surgeon: Kerin Perna, MD;  Location: [facility] OR;  Service: Open Heart Surgery;  Laterality: N/A;   PACEMAKER INSERTION  03/31/2012   STJ Accent DR pacemaker implanted by Dr Johney Frame   PERMANENT PACEMAKER INSERTION N/A 03/31/2012   Procedure: PERMANENT  PACEMAKER INSERTION;  Surgeon: [name], MD;  Location: [facility] CATH LAB;  Service: Cardiovascular;  Laterality: N/A;   PPM GENERATOR CHANGEOUT N/A 08/08/2020   Procedure: PPM GENERATOR CHANGEOUT;  Surgeon: [name], MD;  Location: [facility] INVASIVE CV LAB;  Service: Cardiovascular;  Laterality: N/A;   RADIOLOGY WITH ANESTHESIA Left 02/14/2021   Procedure: RADIOLOGY WITH ANESTHESIA LEFT RENAL CRYOABLATION;  Surgeon: Irish Lack, MD;  Location: [facility] ORS;  Service: Radiology;  Laterality: Left;   STUMP REVISION Right 10/09/2022   Procedure: REVISION RIGHT BELOW KNEE AMPUTATION;  Surgeon: [name], MD;  Location: [facility] OR;  Service: Orthopedics;  Laterality: Right;   STUMP REVISION Right 11/16/2022   Procedure: REVISION RIGHT BELOW KNEE AMPUTATION;  Surgeon: [name], MD;  Location: [facility] OR;  Service: Orthopedics;  Laterality: Right;   TEE WITHOUT CARDIOVERSION  01/30/2012   Procedure: TRANSESOPHAGEAL ECHOCARDIOGRAM (TEE);  Surgeon: Laurey Morale, MD;  Location: [facility] ENDOSCOPY;  Service: Cardiovascular;  Laterality: N/A;  ablation next day   TEE WITHOUT CARDIOVERSION N/A 08/22/2015   Procedure: TRANSESOPHAGEAL ECHOCARDIOGRAM (TEE);  Surgeon: Kerin Perna, MD;  Location: [facility] OR;  Service: Open Heart Surgery;  Laterality: N/A;   TEE WITHOUT CARDIOVERSION N/A 10/19/2019   Procedure: TRANSESOPHAGEAL ECHOCARDIOGRAM (TEE);  Surgeon: Dolores Patty, MD;  Location: [facility] ENDOSCOPY;  Service: Cardiovascular;  Laterality: N/A;   US ECHOCARDIOGRAPHY  08/07/2009   EF 55-60%   Social History   Occupational History   Occupation: retired  Tobacco Use   Smoking status: Former    Current packs/day: 0.00    Average packs/day: 1 pack/day for 30.2 years (30.2 ttl pk-yrs)    Types: Cigarettes    Start date: 1963  Quit date: 07/18/1991    Years since quitting: 31.6    Passive exposure: Never   Smokeless tobacco: Never  Vaping Use   Vaping status: Never Used  Substance and Sexual Activity    Alcohol use: Not Currently    Comment: occasional   Drug use: Yes    Types: Marijuana    Comment: Marijuana- for nausea   Sexual activity: Not Currently

## 2023-02-19 NOTE — Progress Notes (Signed)
Remote pacemaker transmission.   

## 2023-02-21 ENCOUNTER — Other Ambulatory Visit: Payer: Self-pay | Admitting: Emergency Medicine

## 2023-02-25 ENCOUNTER — Encounter (HOSPITAL_COMMUNITY): Payer: Self-pay

## 2023-02-25 ENCOUNTER — Other Ambulatory Visit: Payer: Self-pay

## 2023-02-25 ENCOUNTER — Emergency Department (HOSPITAL_COMMUNITY): Payer: Medicare Other

## 2023-02-25 ENCOUNTER — Emergency Department (HOSPITAL_COMMUNITY)
Admission: EM | Admit: 2023-02-25 | Discharge: 2023-02-25 | Disposition: A | Discharge status: Home / Self Care | Payer: Medicare Other | Attending: Emergency Medicine | Admitting: Emergency Medicine

## 2023-02-25 DIAGNOSIS — Z7901 Long term (current) use of anticoagulants: Secondary | ICD-10-CM | POA: Diagnosis not present

## 2023-02-25 DIAGNOSIS — I5033 Acute on chronic diastolic (congestive) heart failure: Secondary | ICD-10-CM | POA: Insufficient documentation

## 2023-02-25 DIAGNOSIS — Z87891 Personal history of nicotine dependence: Secondary | ICD-10-CM | POA: Insufficient documentation

## 2023-02-25 DIAGNOSIS — M2550 Pain in unspecified joint: Secondary | ICD-10-CM | POA: Diagnosis not present

## 2023-02-25 DIAGNOSIS — Z1152 Encounter for screening for COVID-19: Secondary | ICD-10-CM | POA: Insufficient documentation

## 2023-02-25 DIAGNOSIS — N1832 Chronic kidney disease, stage 3b: Secondary | ICD-10-CM | POA: Insufficient documentation

## 2023-02-25 DIAGNOSIS — R0682 Tachypnea, not elsewhere classified: Secondary | ICD-10-CM | POA: Diagnosis not present

## 2023-02-25 DIAGNOSIS — I509 Heart failure, unspecified: Secondary | ICD-10-CM | POA: Insufficient documentation

## 2023-02-25 DIAGNOSIS — Z950 Presence of cardiac pacemaker: Secondary | ICD-10-CM | POA: Insufficient documentation

## 2023-02-25 DIAGNOSIS — I4891 Unspecified atrial fibrillation: Secondary | ICD-10-CM | POA: Diagnosis not present

## 2023-02-25 DIAGNOSIS — R112 Nausea with vomiting, unspecified: Secondary | ICD-10-CM | POA: Insufficient documentation

## 2023-02-25 DIAGNOSIS — Z79899 Other long term (current) drug therapy: Secondary | ICD-10-CM | POA: Diagnosis not present

## 2023-02-25 DIAGNOSIS — J45909 Unspecified asthma, uncomplicated: Secondary | ICD-10-CM | POA: Diagnosis not present

## 2023-02-25 DIAGNOSIS — R509 Fever, unspecified: Secondary | ICD-10-CM | POA: Diagnosis not present

## 2023-02-25 DIAGNOSIS — E039 Hypothyroidism, unspecified: Secondary | ICD-10-CM | POA: Diagnosis not present

## 2023-02-25 DIAGNOSIS — R911 Solitary pulmonary nodule: Secondary | ICD-10-CM | POA: Diagnosis not present

## 2023-02-25 DIAGNOSIS — Z7951 Long term (current) use of inhaled steroids: Secondary | ICD-10-CM | POA: Diagnosis not present

## 2023-02-25 DIAGNOSIS — J449 Chronic obstructive pulmonary disease, unspecified: Secondary | ICD-10-CM | POA: Diagnosis not present

## 2023-02-25 DIAGNOSIS — I130 Hypertensive heart and chronic kidney disease with heart failure and stage 1 through stage 4 chronic kidney disease, or unspecified chronic kidney disease: Secondary | ICD-10-CM | POA: Diagnosis not present

## 2023-02-25 DIAGNOSIS — R079 Chest pain, unspecified: Secondary | ICD-10-CM | POA: Diagnosis present

## 2023-02-25 DIAGNOSIS — R109 Unspecified abdominal pain: Secondary | ICD-10-CM | POA: Diagnosis not present

## 2023-02-25 LAB — CBC WITH DIFFERENTIAL/PLATELET
Abs Immature Granulocytes: 0.03 10*3/uL (ref 0.00–0.07)
Basophils Absolute: 0.1 10*3/uL (ref 0.0–0.1)
Basophils Relative: 1 %
Eosinophils Absolute: 0 10*3/uL (ref 0.0–0.5)
Eosinophils Relative: 0 %
HCT: 38.6 % — ABNORMAL LOW (ref 39.0–52.0)
Hemoglobin: 11.4 g/dL — ABNORMAL LOW (ref 13.0–17.0)
Immature Granulocytes: 0 %
Lymphocytes Relative: 9 %
Lymphs Abs: 0.8 10*3/uL (ref 0.7–4.0)
MCH: 23.4 pg — ABNORMAL LOW (ref 26.0–34.0)
MCHC: 29.5 g/dL — ABNORMAL LOW (ref 30.0–36.0)
MCV: 79.3 fL — ABNORMAL LOW (ref 80.0–100.0)
Monocytes Absolute: 0.7 10*3/uL (ref 0.1–1.0)
Monocytes Relative: 7 %
Neutro Abs: 7.9 10*3/uL — ABNORMAL HIGH (ref 1.7–7.7)
Neutrophils Relative %: 83 %
Platelets: 207 10*3/uL (ref 150–400)
RBC: 4.87 MIL/uL (ref 4.22–5.81)
RDW: 18.6 % — ABNORMAL HIGH (ref 11.5–15.5)
WBC: 9.6 10*3/uL (ref 4.0–10.5)
nRBC: 0 % (ref 0.0–0.2)

## 2023-02-25 LAB — HEPATIC FUNCTION PANEL
ALT: 17 U/L (ref 0–44)
AST: 23 U/L (ref 15–41)
Albumin: 3.9 g/dL (ref 3.5–5.0)
Alkaline Phosphatase: 65 U/L (ref 38–126)
Bilirubin, Direct: 0.2 mg/dL (ref 0.0–0.2)
Indirect Bilirubin: 0.5 mg/dL (ref 0.3–0.9)
Total Bilirubin: 0.7 mg/dL (ref 0.3–1.2)
Total Protein: 7.3 g/dL (ref 6.5–8.1)

## 2023-02-25 LAB — BASIC METABOLIC PANEL
Anion gap: 15 (ref 5–15)
BUN: 17 mg/dL (ref 8–23)
CO2: 23 mmol/L (ref 22–32)
Calcium: 10.3 mg/dL (ref 8.9–10.3)
Chloride: 101 mmol/L (ref 98–111)
Creatinine, Ser: 1.43 mg/dL — ABNORMAL HIGH (ref 0.61–1.24)
GFR, Estimated: 52 mL/min — ABNORMAL LOW (ref >60)
Glucose, Bld: 125 mg/dL — ABNORMAL HIGH (ref 70–99)
Potassium: 4.4 mmol/L (ref 3.5–5.1)
Sodium: 139 mmol/L (ref 135–145)

## 2023-02-25 LAB — URINALYSIS, ROUTINE W REFLEX MICROSCOPIC
Bacteria, UA: NONE SEEN
Bilirubin Urine: NEGATIVE
Glucose, UA: 150 mg/dL — AB
Hgb urine dipstick: NEGATIVE
Ketones, ur: 5 mg/dL — AB
Leukocytes,Ua: NEGATIVE
Nitrite: NEGATIVE
Protein, ur: 100 mg/dL — AB
Specific Gravity, Urine: 1.016 (ref 1.005–1.030)
pH: 8 (ref 5.0–8.0)

## 2023-02-25 LAB — CBC
HCT: 38.5 % — ABNORMAL LOW (ref 39.0–52.0)
Hemoglobin: 11.5 g/dL — ABNORMAL LOW (ref 13.0–17.0)
MCH: 23.8 pg — ABNORMAL LOW (ref 26.0–34.0)
MCHC: 29.9 g/dL — ABNORMAL LOW (ref 30.0–36.0)
MCV: 79.5 fL — ABNORMAL LOW (ref 80.0–100.0)
Platelets: 206 10*3/uL (ref 150–400)
RBC: 4.84 MIL/uL (ref 4.22–5.81)
RDW: 18.6 % — ABNORMAL HIGH (ref 11.5–15.5)
WBC: 9.8 10*3/uL (ref 4.0–10.5)
nRBC: 0 % (ref 0.0–0.2)

## 2023-02-25 LAB — LIPASE, BLOOD: Lipase: 22 U/L (ref 11–51)

## 2023-02-25 LAB — I-STAT CG4 LACTIC ACID, ED
Lactic Acid, Venous: 2.1 mmol/L (ref 0.5–1.9)
Lactic Acid, Venous: 1.7 mmol/L (ref 0.5–1.9)

## 2023-02-25 LAB — RESP PANEL BY RT-PCR (RSV, FLU A&B, COVID)  RVPGX2
Influenza A by PCR: NEGATIVE
Influenza B by PCR: NEGATIVE
Resp Syncytial Virus by PCR: NEGATIVE
SARS Coronavirus 2 by RT PCR: NEGATIVE

## 2023-02-25 LAB — TROPONIN I (HIGH SENSITIVITY)
Troponin I (High Sensitivity): 19 ng/L — ABNORMAL HIGH (ref <18)
Troponin I (High Sensitivity): 17 ng/L (ref <18)

## 2023-02-25 MED ORDER — ACETAMINOPHEN 500 MG PO TABS
1000.0000 mg | ORAL_TABLET | Freq: Once | ORAL | Status: AC
Start: 2023-02-25 — End: 2023-02-25
  Administered 2023-02-25: 1000 mg via ORAL
  Filled 2023-02-25: qty 2

## 2023-02-25 MED ORDER — ONDANSETRON HCL 4 MG/2ML IJ SOLN
4.0000 mg | Freq: Once | INTRAMUSCULAR | Status: AC
  Administered 2023-02-25: 4 mg via INTRAVENOUS
  Filled 2023-02-25: qty 2

## 2023-02-25 MED ORDER — HYDROCODONE-ACETAMINOPHEN 5-325 MG PO TABS
1.0000 | ORAL_TABLET | Freq: Once | ORAL | Status: AC
  Administered 2023-02-25: 1 via ORAL
  Filled 2023-02-25: qty 1

## 2023-02-25 MED ORDER — IOHEXOL 350 MG/ML SOLN
75.0000 mL | Freq: Once | INTRAVENOUS | Status: AC | PRN
  Administered 2023-02-25: 75 mL via INTRAVENOUS

## 2023-02-25 MED ORDER — SODIUM CHLORIDE 0.9 % IV BOLUS
1000.0000 mL | Freq: Once | INTRAVENOUS | Status: AC
  Administered 2023-02-25: 1000 mL via INTRAVENOUS

## 2023-02-25 MED ORDER — ALUM & MAG HYDROXIDE-SIMETH 200-200-20 MG/5ML PO SUSP
30.0000 mL | Freq: Once | ORAL | Status: AC
  Administered 2023-02-25: 30 mL via ORAL
  Filled 2023-02-25: qty 30

## 2023-02-25 MED ORDER — FAMOTIDINE IN NACL 20-0.9 MG/50ML-% IV SOLN
20.0000 mg | Freq: Once | INTRAVENOUS | Status: AC
  Administered 2023-02-25: 20 mg via INTRAVENOUS
  Filled 2023-02-25: qty 50

## 2023-02-25 MED ORDER — KETOROLAC TROMETHAMINE 15 MG/ML IJ SOLN
15.0000 mg | Freq: Once | INTRAMUSCULAR | Status: AC
  Administered 2023-02-25: 15 mg via INTRAVENOUS
  Filled 2023-02-25: qty 1

## 2023-02-25 MED ORDER — ONDANSETRON 4 MG PO TBDP
4.0000 mg | ORAL_TABLET | Freq: Once | ORAL | Status: AC | PRN
  Administered 2023-02-25: 4 mg via ORAL
  Filled 2023-02-25: qty 1

## 2023-02-25 NOTE — ED Notes (Signed)
Patient transported to CT 

## 2023-02-25 NOTE — ED Triage Notes (Signed)
Pt c/o chest pain, nausea, vomiting, and fevers that started today.

## 2023-02-25 NOTE — ED Notes (Signed)
Jones Skene in lab regarding labs in process.

## 2023-02-25 NOTE — ED Notes (Signed)
Pt to xray

## 2023-02-25 NOTE — ED Provider Notes (Signed)
  Physical Exam  BP (!) 114/51 (BP Location: Left Arm)   Pulse 65   Temp 98 F (36.7 C) (Oral)   Resp 17   Ht 5\' 11"  (1.803 m)   Wt 75 kg   SpO2 95%   BMI 23.06 kg/m   Physical Exam  Procedures  Procedures  ED Course / MDM   Clinical Course as of 02/25/23 1650  Tue Feb 25, 2023  1241 Creatinine(!): 1.43 Similar to prior  [SG]    Clinical Course User Index [SG] Sloan Leiter, DO   Medical Decision Making Amount and/or Complexity of Data Reviewed Labs: ordered. Decision-making details documented in ED Course. Radiology: ordered.  Risk OTC drugs. Prescription drug management.    Wardell Honour, assumed care for this patient.  In brief, 72 year old male presented to the emergency department today due to abdominal discomfort, nausea, vomiting.  At some epigastric pain.  Patient was signed out pending reevaluation after analgesia.  Reviewed the patient's labs, no significant abnormality he overall looks well, feels well.  Would like to go home.  Likely viral syndrome.  Will discharge.  Return precautions discussed with patient at bedside.     Anders Simmonds T, DO 02/25/23 1732

## 2023-02-25 NOTE — Discharge Instructions (Addendum)
Please follow up with your pcp for further evaluation  A nodule was noted on your lung, please follow up with PCP for repeat imaging in 6-12 months.   It was a pleasure caring for you today in the emergency department.  Please return to the emergency department for any worsening or worrisome symptoms.

## 2023-02-25 NOTE — ED Provider Notes (Signed)
[place] EMERGENCY DEPARTMENT AT [facility] Provider Note  CSN: [id number] Arrival date & time: 02/25/23 8295  Chief Complaint(s) Chest Pain  HPI JAMOR AJAYI is a 71 y.o. male with past medical history as below, significant for hypertension, OSA, COPD, CHF, A-fib status post ablation, pacemaker, status post AVR who presents to the ED with complaint of cp/n/v/ subjective fevers starting yestd.   Pt reports onset yestd of symptoms, chest pain/pressure sensation, coughing clear sputum, abd pain peri-umbilical, nausea and vomiting, no diarrhea. Diffuse arthralgias. Subjective fevers, chills. No suspicious PO intake or recent travel. No dyspnea.   Past Medical History Past Medical History:  Diagnosis Date   Aortic stenosis, mild    Arthritis of hand    "just a little bit in both hands" (03/31/2012)   Asthma    "little bit" (03/31/2012)   Atrial fibrillation (HCC)    dx '04; DCCV '04, placed on flecainide, failed DCCV 04/2010, flecainide stopped; s/p successful A.fib ablation 01/31/12   CHF (congestive heart failure) (HCC)    Controlled type 2 diabetes mellitus without complication, without long-term current use of insulin (HCC) 12/13/2019   COPD (chronic obstructive pulmonary disease) (HCC)    DJD (degenerative joint disease)    Fatty liver    Fibromyalgia    GERD (gastroesophageal reflux disease)    Hyperlipidemia    Hypertension    Hypothyroidism    S/P radiation   Left atrial enlargement    LA size 45mm by echo 11/21/11   Mitral regurgitation    trivial   Obstructive sleep apnea    mild by sleep study 2013; pt stated he does not have a machine because "it wasn't bad enough for him to have one"   Pacemaker 03/31/2012   Pneumonia    last time 2023   Restless leg syndrome    Second degree AV block    Splenomegaly    Patient Active Problem List   Diagnosis Date Noted   AKI (acute kidney injury) (HCC) 12/22/2022   Fall 11/14/2022   Pain of right lower  extremity due to injury 11/14/2022   Diabetic ulcer of lower leg (HCC) 11/13/2022   Osteomyelitis (HCC) 11/13/2022   Dyslipidemia 10/18/2022   Cellulitis of right lower extremity 10/17/2022   Dehiscence of amputation stump of right lower extremity (HCC) 10/09/2022   Below-knee amputation of right lower extremity (HCC) 10/09/2022   Gangrene of right foot (HCC) 09/04/2022   History of below-knee amputation of right lower extremity (HCC) 09/04/2022   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) 08/23/2022   CAP (community acquired pneumonia) 12/28/2021   Constipation 12/28/2021   Pain in both lower extremities 07/03/2021   Peripheral neuropathy 07/03/2021   Acute metabolic encephalopathy 06/09/2021   Marijuana dependence (HCC) 06/09/2021   DNR (do not resuscitate) 06/09/2021   Chest pain 06/08/2021   Renal lesion 02/14/2021   Status post cryoablation 02/14/2021   Allergic rhinitis 12/08/2020   Preop pulmonary/respiratory exam 03/27/2020   Acute on chronic diastolic (congestive) heart failure (HCC) 01/22/2020   Abdominal pain 01/22/2020   Hypothyroidism 12/13/2019   DM2 (diabetes mellitus, type 2) (HCC) 12/13/2019   Chronic kidney disease, stage 3b (HCC) 12/13/2019   Acute bacterial bronchitis 12/13/2019   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   Typical atrial flutter (HCC)    Chronic respiratory failure with hypoxia (HCC) 05/23/2016   CAD (coronary artery disease) 05/23/2016   Persistent atrial fibrillation (HCC)    Atypical atrial flutter (HCC)  Visit for monitoring Tikosyn therapy 04/16/2016   Chronic diastolic CHF (congestive heart failure) (HCC) 02/03/2016   Cough 12/06/2015   Acute on chronic respiratory failure with hypoxia (HCC) 09/08/2015   Hallucinations 09/04/2015   Lymphadenopathy 09/04/2015   Ascending aortic aneurysm (HCC) 09/04/2015   Hypoxia 09/02/2015   S/P aortic valve replacement with bioprosthetic valve 09/02/2015   Pleural effusion 09/02/2015   S/P AVR  08/22/2015   GERD without esophagitis 09/20/2014   Intrinsic asthma 10/13/2013   Dyspnea 09/15/2013   Pulmonary hypertension (HCC) 09/15/2013   Shortness of breath 05/05/2013   Pacemaker-St.Jude 04/01/2012   Bradycardia 03/31/2012   Second degree AV block 03/31/2012   AV block, 1st degree 02/01/2012   PAF (paroxysmal atrial fibrillation) (HCC) 11/13/2011   Fatigue 11/13/2011   Essential hypertension 11/13/2011   Aortic stenosis 11/13/2011   Sleep apnea 11/13/2011   Home Medication(s) Prior to Admission medications   Medication Sig Start Date End Date Taking? Authorizing Provider  acetaminophen (TYLENOL) 325 MG tablet Take 2 tablets (650 mg total) by mouth every 6 (six) hours as needed. 12/24/22   Lockie Mola, MD  albuterol (PROVENTIL HFA;VENTOLIN HFA) 108 (90 Base) MCG/ACT inhaler Inhale 2 puffs into the lungs every 6 (six) hours as needed for wheezing or shortness of breath. 12/28/15   Leslye Peer, MD  albuterol (VENTOLIN HFA) 108 (90 Base) MCG/ACT inhaler Inhale 2 puffs into the lungs every 4 (four) hours as needed for wheezing or shortness of breath. 01/31/23   Elayne Snare K, DO  amLODipine (NORVASC) 5 MG tablet Take 5 mg by mouth in the morning.    [provider]  apixaban (ELIQUIS) 5 MG TABS tablet Take 1 tablet (5 mg total) by mouth 2 (two) times daily. 08/21/22   Graciella Freer, PA-C  bisoprolol (ZEBETA) 5 MG tablet Take 1 tablet (5 mg total) by mouth daily. Please call office to arrange follow up appointment 11/29/22   Bensimhon, Bevelyn Buckles, MD  Budeson-Glycopyrrol-Formoterol (BREZTRI AEROSPHERE) 160-9-4.8 MCG/ACT AERO Inhale 2 puffs into the lungs in the morning and at bedtime. 05/23/22   Leslye Peer, MD  Cholecalciferol (VITAMIN D) 50 MCG (2000 UT) tablet Take 2 tablets (4,000 Units total) by mouth daily. 12/24/22   Lockie Mola, MD  empagliflozin (JARDIANCE) 10 MG TABS tablet Take 1 tablet (10 mg total) by mouth daily. 05/27/22   Bensimhon, Bevelyn Buckles,  MD  gabapentin (NEURONTIN) 300 MG capsule Take 2 capsules (600 mg total) by mouth 3 (three) times daily. for neuropathy 10/19/22   Rai, Delene Ruffini, MD  hydrOXYzine (ATARAX) 10 MG tablet TAKE 1 TABLET(10 MG) BY MOUTH THREE TIMES DAILY AS NEEDED Patient taking differently: Take 10 mg by mouth 3 (three) times daily as needed. 12/04/22   [name], MD  levothyroxine (SYNTHROID, LEVOTHROID) 175 MCG tablet Take 175 mcg by mouth daily before breakfast.    [provider]  Menthol-Camphor (TIGER BALM ARTHRITIS RUB EX) Apply 1 application topically as needed (for leg pain).    [provider]  montelukast (SINGULAIR) 10 MG tablet TAKE 1 TABLET(10 MG) BY MOUTH AT BEDTIME Patient taking differently: Take 10 mg by mouth daily. 06/27/22   Leslye Peer, MD  Multiple Minerals-Vitamins (CAL-MAG-ZINC-D) TABS Take 1 tablet by mouth daily.    [provider]  Multiple Vitamin (MULTIVITAMIN WITH MINERALS) TABS tablet Take 1 tablet by mouth daily. 12/15/19   Sheikh, Omair Latif, DO  ondansetron (ZOFRAN) 4 MG tablet Take 1 tablet (4 mg total) by  mouth every 6 (six) hours. 01/31/23   Rexford Maus, DO  oxyCODONE-acetaminophen (PERCOCET/ROXICET) 5-325 MG tablet Take 1 tablet by mouth every 8 (eight) hours as needed for severe pain. 12/31/22   [name], MD  OXYGEN Inhale 2 L/min into the lungs See admin instructions. 2 L/min at bedtime and 2 L/min throughout the day as needed for shortness of breath    [provider]  pantoprazole (PROTONIX) 40 MG tablet Take 40 mg by mouth daily before breakfast.    [provider]  Polyethyl Glyc-Propyl Glyc PF (SYSTANE ULTRA PF) 0.4-0.3 % SOLN Place 1 drop into both eyes 2 (two) times daily as needed (dry eyes).    [provider]  polyethylene glycol (MIRALAX / GLYCOLAX) 17 g packet Take 17 g by mouth 2 (two) times daily as needed. 12/19/22   Pricilla Loveless, MD  potassium chloride SA (KLOR-CON M) 20 MEQ tablet Take 20  mEq by mouth See admin instructions. Take 20 meq twice daily every other day    [provider]  predniSONE (DELTASONE) 2.5 MG tablet TAKE 1 TABLET(2.5 MG) BY MOUTH DAILY WITH BREAKFAST Patient taking differently: Take 2.5 mg by mouth daily with breakfast. 02/24/23   Byrum, Les Pou, MD  rOPINIRole (REQUIP) 1 MG tablet Take 1 tablet (1 mg total) by mouth in the morning and at bedtime. 01/23/20   Glade Lloyd, MD  rosuvastatin (CRESTOR) 10 MG tablet Take 10 mg by mouth at bedtime.  08/08/16   [provider]  senna-docusate (SENOKOT-S) 8.6-50 MG tablet Take 1 tablet by mouth 2 (two) times daily.    [provider]  spironolactone (ALDACTONE) 25 MG tablet Take 1 tablet (25 mg total) by mouth daily. 02/16/16   Graciella Freer, PA-C  torsemide (DEMADEX) 10 MG tablet Take 15 mg by mouth every other day.    [provider]                                                                                                                                    Past Surgical History Past Surgical History:  Procedure Laterality Date   AMPUTATION Right 08/23/2022   Procedure: RIGHT FOOT FIFTH RAY AMPUTATION;  Surgeon: [name], MD;  Location: [facility] OR;  Service: Orthopedics;  Laterality: Right;   AMPUTATION Right 09/04/2022   Procedure: RIGHT BELOW KNEE AMPUTATION;  Surgeon: [name], MD;  Location: [facility] OR;  Service: Orthopedics;  Laterality: Right;   AORTIC VALVE REPLACEMENT N/A 08/22/2015   Procedure: AORTIC VALVE REPLACEMENT (AVR);  Surgeon: Kerin Perna, MD;  Location: [facility] OR;  Service: Open Heart Surgery;  Laterality: N/A;   ATRIAL FIBRILLATION ABLATION  01/30/2012   PVI by Dr. Johney Frame   ATRIAL FIBRILLATION ABLATION N/A 01/31/2012   Procedure: ATRIAL FIBRILLATION ABLATION;  Surgeon: [name], MD;  Location: [facility] CATH LAB;  Service: Cardiovascular;  Laterality: N/A;   ATRIAL FIBRILLATION ABLATION  N/A 04/19/2020   Procedure: ATRIAL FIBRILLATION ABLATION;   Surgeon: [name], MD;  Location: [facility] INVASIVE CV LAB;  Service: Cardiovascular;  Laterality: N/A;   BACK SURGERY     X 3   BIOPSY  01/01/2022   Procedure: BIOPSY;  Surgeon: Kathi Der, MD;  Location: [facility] ENDOSCOPY;  Service: Gastroenterology;;   CARDIAC CATHETERIZATION N/A 08/09/2015   Procedure: Right/Left Heart Cath and Coronary Angiography;  Surgeon: Peter M Swaziland, MD;  Location: [facility] INVASIVE CV LAB;  Service: Cardiovascular;  Laterality: N/A;   CARDIAC CATHETERIZATION N/A 02/05/2016   Procedure: Right/Left Heart Cath and Coronary Angiography;  Surgeon: Corky Crafts, MD;  Location: [facility] INVASIVE CV LAB;  Service: Cardiovascular;  Laterality: N/A;   CARDIAC CATHETERIZATION N/A 04/17/2016   Procedure: Right Heart Cath;  Surgeon: Dolores Patty, MD;  Location: [facility] INVASIVE CV LAB;  Service: Cardiovascular;  Laterality: N/A;   CARDIOVASCULAR STRESS TEST  03/12/2002   EF 48%, NO EVIDENCE OF ISCHEMIA   CARDIOVERSION  01/2012; 03/31/2012   CARDIOVERSION N/A 02/25/2012   Procedure: CARDIOVERSION;  Surgeon: [name], MD;  Location: [facility] CATH LAB;  Service: Cardiovascular;  Laterality: N/A;   CARDIOVERSION N/A 03/31/2012   Procedure: CARDIOVERSION;  Surgeon: [name], MD;  Location: [facility] CATH LAB;  Service: Cardiovascular;  Laterality: N/A;   CARDIOVERSION N/A 11/23/2015   Procedure: CARDIOVERSION;  Surgeon: Laurey Morale, MD;  Location: [facility] ENDOSCOPY;  Service: Cardiovascular;  Laterality: N/A;   CARDIOVERSION N/A 04/08/2016   Procedure: CARDIOVERSION;  Surgeon: Dolores Patty, MD;  Location: [facility] ENDOSCOPY;  Service: Cardiovascular;  Laterality: N/A;   CARDIOVERSION N/A 09/14/2018   Procedure: CARDIOVERSION;  Surgeon: Wendall Stade, MD;  Location: [facility] ENDOSCOPY;  Service: Cardiovascular;  Laterality: N/A;   CARDIOVERSION N/A 10/19/2019   Procedure: CARDIOVERSION;  Surgeon: Dolores Patty, MD;  Location: [facility] ENDOSCOPY;  Service: Cardiovascular;  Laterality: N/A;    CARDIOVERSION N/A 02/25/2020   Procedure: CARDIOVERSION;  Surgeon: Pricilla Riffle, MD;  Location: [facility] ENDOSCOPY;  Service: Cardiovascular;  Laterality: N/A;   CARDIOVERSION N/A 05/25/2020   Procedure: CARDIOVERSION;  Surgeon: Lewayne Bunting, MD;  Location: [facility] ENDOSCOPY;  Service: Cardiovascular;  Laterality: N/A;   CARDIOVERSION N/A 07/10/2020   Procedure: CARDIOVERSION;  Surgeon: Jake Bathe, MD;  Location: [facility] ENDOSCOPY;  Service: Cardiovascular;  Laterality: N/A;   CLIPPING OF ATRIAL APPENDAGE N/A 08/22/2015   Procedure: CLIPPING OF ATRIAL APPENDAGE;  Surgeon: Kerin Perna, MD;  Location: [facility] OR;  Service: Open Heart Surgery;  Laterality: N/A;   COLONOSCOPY WITH PROPOFOL N/A 01/01/2022   Procedure: COLONOSCOPY WITH PROPOFOL;  Surgeon: Kathi Der, MD;  Location: [facility] ENDOSCOPY;  Service: Gastroenterology;  Laterality: N/A;   DOPPLER ECHOCARDIOGRAPHY  03/11/2002   EF 70-75%   FINGER SURGERY Left    Middle finger   FINGER TENDON REPAIR  1980's   "right little finger" (03/31/2012)   FLEXIBLE SIGMOIDOSCOPY N/A 12/30/2021   Procedure: FLEXIBLE SIGMOIDOSCOPY;  Surgeon: Charlott Rakes, MD;  Location: [facility] ENDOSCOPY;  Service: Gastroenterology;  Laterality: N/A;   INSERT / REPLACE / REMOVE PACEMAKER     St Jude   IR RADIOLOGIST EVAL & MGMT  05/06/2019   IR RADIOLOGIST EVAL & MGMT  10/06/2019   IR RADIOLOGIST EVAL & MGMT  10/31/2020   IR RADIOLOGIST EVAL & MGMT  01/18/2021   IR RADIOLOGIST EVAL & MGMT  03/08/2021   IR RADIOLOGIST EVAL & MGMT  06/13/2021   KIDNEY SURGERY Left    LEFT AND RIGHT HEART  CATHETERIZATION WITH CORONARY ANGIOGRAM N/A 10/27/2012   Procedure: LEFT AND RIGHT HEART CATHETERIZATION WITH CORONARY ANGIOGRAM;  Surgeon: Pamella Pert, MD;  Location: [facility] CATH LAB;  Service: Cardiovascular;  Laterality: N/A;   LUMBAR DISC SURGERY  1980's X2;  2000's   MAZE N/A 08/22/2015   Procedure: MAZE;  Surgeon: Kerin Perna, MD;  Location: [facility] OR;  Service: Open Heart Surgery;   Laterality: N/A;   PACEMAKER INSERTION  03/31/2012   STJ Accent DR pacemaker implanted by Dr Johney Frame   PERMANENT PACEMAKER INSERTION N/A 03/31/2012   Procedure: PERMANENT PACEMAKER INSERTION;  Surgeon: [name], MD;  Location: [facility] CATH LAB;  Service: Cardiovascular;  Laterality: N/A;   PPM GENERATOR CHANGEOUT N/A 08/08/2020   Procedure: PPM GENERATOR CHANGEOUT;  Surgeon: [name], MD;  Location: [facility] INVASIVE CV LAB;  Service: Cardiovascular;  Laterality: N/A;   RADIOLOGY WITH ANESTHESIA Left 02/14/2021   Procedure: RADIOLOGY WITH ANESTHESIA LEFT RENAL CRYOABLATION;  Surgeon: Irish Lack, MD;  Location: [facility] ORS;  Service: Radiology;  Laterality: Left;   STUMP REVISION Right 10/09/2022   Procedure: REVISION RIGHT BELOW KNEE AMPUTATION;  Surgeon: [name], MD;  Location: [facility] OR;  Service: Orthopedics;  Laterality: Right;   STUMP REVISION Right 11/16/2022   Procedure: REVISION RIGHT BELOW KNEE AMPUTATION;  Surgeon: [name], MD;  Location: [facility] OR;  Service: Orthopedics;  Laterality: Right;   TEE WITHOUT CARDIOVERSION  01/30/2012   Procedure: TRANSESOPHAGEAL ECHOCARDIOGRAM (TEE);  Surgeon: Laurey Morale, MD;  Location: [facility] ENDOSCOPY;  Service: Cardiovascular;  Laterality: N/A;  ablation next day   TEE WITHOUT CARDIOVERSION N/A 08/22/2015   Procedure: TRANSESOPHAGEAL ECHOCARDIOGRAM (TEE);  Surgeon: Kerin Perna, MD;  Location: [facility] OR;  Service: Open Heart Surgery;  Laterality: N/A;   TEE WITHOUT CARDIOVERSION N/A 10/19/2019   Procedure: TRANSESOPHAGEAL ECHOCARDIOGRAM (TEE);  Surgeon: Dolores Patty, MD;  Location: [facility] ENDOSCOPY;  Service: Cardiovascular;  Laterality: N/A;   US ECHOCARDIOGRAPHY  08/07/2009   EF 55-60%   Family History Family History  Problem Relation Age of Onset   Heart failure Mother    Stroke Father    Neuropathy Neg Hx     Social History Social History   Tobacco Use   Smoking status: Former    Current packs/day: 0.00    Average packs/day: 1  pack/day for 30.2 years (30.2 ttl pk-yrs)    Types: Cigarettes    Start date: 1963    Quit date: 07/18/1991    Years since quitting: 31.6    Passive exposure: Never   Smokeless tobacco: Never  Vaping Use   Vaping status: Never Used  Substance Use Topics   Alcohol use: Not Currently    Comment: occasional   Drug use: Yes    Types: Marijuana    Comment: Marijuana- for nausea   Allergies Cephalexin, Meloxicam, and Vancomycin  Review of Systems Review of Systems  Constitutional:  Positive for chills, fatigue and fever.  Respiratory:  Positive for cough and shortness of breath.   Cardiovascular:  Positive for chest pain.  Gastrointestinal:  Positive for abdominal pain, nausea and vomiting. Negative for constipation.  Genitourinary:  Negative for dysuria and urgency.  Musculoskeletal:  Positive for arthralgias.  Skin:  Negative for wound.  Neurological:  Negative for headaches.  All other systems reviewed and are negative.   Physical Exam Vital Signs  I have reviewed the triage vital signs BP (!) 114/51 (BP Location: Left Arm)   Pulse 65   Temp 98 F (  36.7 C) (Oral)   Resp 17   Ht 5\' 11"  (1.803 m)   Wt 75 kg   SpO2 95%   BMI 23.06 kg/m  Physical Exam Vitals and nursing note reviewed.  Constitutional:      General: He is not in acute distress.    Appearance: He is well-developed.  HENT:     Head: Normocephalic and atraumatic.     Right Ear: External ear normal.     Left Ear: External ear normal.     Mouth/Throat:     Mouth: Mucous membranes are moist.  Eyes:     General: No scleral icterus. Cardiovascular:     Rate and Rhythm: Normal rate and regular rhythm.     Pulses: Normal pulses.     Heart sounds: Normal heart sounds.  Pulmonary:     Effort: Pulmonary effort is normal. Tachypnea present. No respiratory distress.     Breath sounds: Normal breath sounds.  Abdominal:     General: Abdomen is flat.     Palpations: Abdomen is soft.     Tenderness: There is  abdominal tenderness. There is no guarding or rebound.    Musculoskeletal:     Cervical back: No rigidity.     Right lower leg: No edema.     Left lower leg: No edema.     Comments: S/p RLE BKA  Skin:    General: Skin is warm and dry.     Capillary Refill: Capillary refill takes less than 2 seconds.  Neurological:     Mental Status: He is alert and oriented to person, place, and time.     GCS: GCS eye subscore is 4. GCS verbal subscore is 5. GCS motor subscore is 6.  Psychiatric:        Mood and Affect: Mood normal.        Behavior: Behavior normal.     ED Results and Treatments Labs (all labs ordered are listed, but only abnormal results are displayed) Labs Reviewed  BASIC METABOLIC PANEL - Abnormal; Notable for the following components:      Result Value   Glucose, Bld 125 (*)    Creatinine, Ser 1.43 (*)    GFR, Estimated 52 (*)    All other components within normal limits  CBC - Abnormal; Notable for the following components:   Hemoglobin 11.5 (*)    HCT 38.5 (*)    MCV 79.5 (*)    MCH 23.8 (*)    MCHC 29.9 (*)    RDW 18.6 (*)    All other components within normal limits  URINALYSIS, ROUTINE W REFLEX MICROSCOPIC - Abnormal; Notable for the following components:   Glucose, UA 150 (*)    Ketones, ur 5 (*)    Protein, ur 100 (*)    All other components within normal limits  CBC WITH DIFFERENTIAL/PLATELET - Abnormal; Notable for the following components:   Hemoglobin 11.4 (*)    HCT 38.6 (*)    MCV 79.3 (*)    MCH 23.4 (*)    MCHC 29.5 (*)    RDW 18.6 (*)    Neutro Abs 7.9 (*)    All other components within normal limits  I-STAT CG4 LACTIC ACID, ED - Abnormal; Notable for the following components:   Lactic Acid, Venous 2.1 (*)    All other components within normal limits  TROPONIN I (HIGH SENSITIVITY) - Abnormal; Notable for the following components:   Troponin I (High Sensitivity) 19 (*)    All  other components within normal limits  RESP PANEL BY RT-PCR (RSV,  FLU A&B, COVID)  [id number]  LIPASE, BLOOD  HEPATIC FUNCTION PANEL  BRAIN NATRIURETIC PEPTIDE  I-STAT CG4 LACTIC ACID, ED  TROPONIN I (HIGH SENSITIVITY)                                                                                                                          Radiology CT CHEST ABDOMEN PELVIS W CONTRAST  Result Date: 02/25/2023 CLINICAL DATA:  Periumbilical abdominal pain. EXAM: CT CHEST, ABDOMEN, AND PELVIS WITH CONTRAST TECHNIQUE: Multidetector CT imaging of the chest, abdomen and pelvis was performed following the standard protocol during bolus administration of intravenous contrast. RADIATION DOSE REDUCTION: This exam was performed according to the departmental dose-optimization program which includes automated exposure control, adjustment of the mA and/or kV according to patient size and/or use of iterative reconstruction technique. CONTRAST:  75mL OMNIPAQUE IOHEXOL 350 MG/ML SOLN COMPARISON:  CT abdomen/pelvis dated January 06, 2023. CTA chest dated October 17, 2022. FINDINGS: CT CHEST FINDINGS Cardiovascular: Normal heart size. Mild stable dilation of the ascending aorta measuring 4.1 cm. Atherosclerotic calcification of the thoracic aorta and arch branch vessels. Multivessel coronary artery calcifications. Prior aortic valve replacement. Left subclavian dual lead pacemaker in place with leads terminating in the right atrium and right ventricle. No pericardial effusion. Mild dilation of the pulmonary trunk, which can be seen in the setting of pulmonary arterial hypertension. Mediastinum/Nodes: Similar mildly prominent mediastinal nodes. No enlarged intrathoracic nodes by size criteria. No enlarged axillary lymph nodes. The trachea and esophagus demonstrate no significant abnormality. Lungs/Pleura: The lungs are generally well aerated. There is a 6 mm subpleural nodule adjacent to the right major fissure (series 5, image 59), which was not definitively seen on the prior exam. Evidence of  remote granulomatous disease. No focal consolidation. No pneumothorax or pleural effusion. Musculoskeletal: No acute osseous abnormality. No suspicious osseous lesion. Status post median sternotomy. CT ABDOMEN PELVIS FINDINGS Hepatobiliary: The liver is stable in size and morphology. No suspicious focal lesion. A small calcified stone is seen near the gallbladder neck. No gallbladder wall thickening or pericholecystic fluid. No biliary dilatation. Pancreas: Unremarkable. No pancreatic ductal dilatation or surrounding inflammatory changes. Spleen: Normal in size without focal abnormality. Adrenals/Urinary Tract: The adrenal glands are unremarkable. Right anterior renal cortical atrophy with an accessory renal artery. Similar appearance of a partially fatty lesion at the interpolar left kidney, which may relate to prior ablation site. No renal calculi or hydronephrosis. The bladder is unremarkable. Stomach/Bowel: Stomach is within normal limits. Appendix is not clearly identified. No evidence of bowel wall thickening, distention, or inflammatory changes. Vascular/Lymphatic: Aortic atherosclerosis. No enlarged abdominal or pelvic lymph nodes. Reproductive: Prostate is unremarkable. Other: Small left-greater-than-right fat containing inguinal hernias. Small fat containing umbilical hernia. No abdominopelvic ascites. No intraperitoneal free air. Musculoskeletal: No acute osseous abnormality. Atrophy of the paraspinal musculature. IMPRESSION: 1. No acute localizing findings in the chest, abdomen, or pelvis. 2. Cholelithiasis without  evidence of acute cholecystitis. 3. 6 mm subpleural nodule adjacent to the right major fissure, not definitively seen on the prior exam. Non-contrast chest CT at 6-12 months is recommended. If the nodule is stable at time of repeat CT, then future CT at 18-24 months (from today's scan) is considered optional for low-risk patients, but is recommended for high-risk patients. This recommendation  follows the consensus statement: Guidelines for Management of Incidental Pulmonary Nodules Detected on CT Images: From the Fleischner Society 2017; Radiology 2017; 284:228-243. 4. Additional unchanged ancillary findings as described above. 5.  Aortic Atherosclerosis (ICD10-I70.0). Electronically Signed   By: Hart Robinsons M.D.   On: 02/25/2023 15:00   DG Chest 2 View  Result Date: 02/25/2023 CLINICAL DATA:  Chest pain. EXAM: CHEST - 2 VIEW COMPARISON:  Chest radiograph dated January 31, 2023 FINDINGS: The heart size and mediastinal contours are unchanged. Stable left subclavian approach dual lead pacemaker. Status post median sternotomy, aortic valve replacement, and left atrial appendage clipping. Mild bilateral interstitial opacities, similar to slightly decreased compared to the prior exam. No pneumothorax or pleural effusion. The visualized osseous structures are unchanged. IMPRESSION: Similar to slightly decreased mild bilateral interstitial opacities, which may reflect pulmonary vascular congestion. Electronically Signed   By: Hart Robinsons M.D.   On: 02/25/2023 10:52    Pertinent labs & imaging results that were available during my care of the patient were reviewed by me and considered in my medical decision making (see MDM for details).  Medications Ordered in ED Medications  ondansetron (ZOFRAN-ODT) disintegrating tablet 4 mg (4 mg Oral Given 02/25/23 1002)  sodium chloride 0.9 % bolus 1,000 mL (0 mLs Intravenous Stopped 02/25/23 1249)  ondansetron (ZOFRAN) injection 4 mg (4 mg Intravenous Given 02/25/23 1239)  acetaminophen (TYLENOL) tablet 1,000 mg (1,000 mg Oral Given 02/25/23 1241)  iohexol (OMNIPAQUE) 350 MG/ML injection 75 mL (75 mLs Intravenous Contrast Given 02/25/23 1257)  ketorolac (TORADOL) 15 MG/ML injection 15 mg (15 mg Intravenous Given 02/25/23 1352)  alum & mag hydroxide-simeth (MAALOX/MYLANTA) 200-200-20 MG/5ML suspension 30 mL (30 mLs Oral Given 02/25/23 1541)   famotidine (PEPCID) IVPB 20 mg premix (0 mg Intravenous Stopped 02/25/23 1619)  HYDROcodone-acetaminophen (NORCO/VICODIN) 5-325 MG per tablet 1 tablet (1 tablet Oral Given 02/25/23 1622)                                                                                                                                     Procedures Procedures  (including critical care time)  Medical Decision Making / ED Course    Medical Decision Making:    KINSLEY JANELLE is a 72 y.o. male with past medical history as below, significant for hypertension, OSA, COPD, CHF, A-fib status post ablation, pacemaker, status post AVR who presents to the ED with complaint of cp/n/v/fevers starting today. . The complaint involves an extensive differential diagnosis and also carries with it a high risk of complications and  morbidity.  Serious etiology was considered. Ddx includes but is not limited to: Differential includes all life-threatening causes for chest pain. This includes but is not exclusive to acute coronary syndrome, aortic dissection, pulmonary embolism, cardiac tamponade, community-acquired pneumonia, pericarditis, musculoskeletal chest wall pain, etc. Differential diagnosis includes but is not exclusive to acute cholecystitis, intrathoracic causes for epigastric abdominal pain, gastritis, duodenitis, pancreatitis, small bowel or large bowel obstruction, abdominal aortic aneurysm, hernia, gastritis, etc.   Complete initial physical exam performed, notably the patient  was notable rigors on exam, no hypoxia, abd ttp nut not peritoneal.    Reviewed and confirmed nursing documentation for past medical history, family history, social history.  Vital signs reviewed.    Clinical Course as of 02/25/23 1709  Tue Feb 25, 2023  1241 Creatinine(!): 1.43 Similar to prior  [SG]    Clinical Course User Index [SG] Sloan Leiter, DO     Lab reviewed, these are stable.  CT chest/abd/pelvis stable, lung nodule  noted. Cholelithiasis w/o cholecystitis, he has no RUQ pain on exam.  CXR similar to prior  He is feeling somewhat better, the etiology of his pain is unclear at this time.  Per spouse he has had similar episodes in the past of generalized pain that will resolve after a few days.  Similar presentation to ED 01/06/23, improved following droperidol/ativan and was discharged in stable condition  Will give further analgesia, signed out to incoming EDP pending recheck after analgesia. If improved can likely be discharged.               Additional history obtained: -Additional history obtained from spouse -External records from outside source obtained and reviewed including: Chart review including previous notes, labs, imaging, consultation notes including  Home medications Prior ed visits Prior labs/imaging   Lab Tests: -I ordered, reviewed, and interpreted labs.   The pertinent results include:   Labs Reviewed  BASIC METABOLIC PANEL - Abnormal; Notable for the following components:      Result Value   Glucose, Bld 125 (*)    Creatinine, Ser 1.43 (*)    GFR, Estimated 52 (*)    All other components within normal limits  CBC - Abnormal; Notable for the following components:   Hemoglobin 11.5 (*)    HCT 38.5 (*)    MCV 79.5 (*)    MCH 23.8 (*)    MCHC 29.9 (*)    RDW 18.6 (*)    All other components within normal limits  URINALYSIS, ROUTINE W REFLEX MICROSCOPIC - Abnormal; Notable for the following components:   Glucose, UA 150 (*)    Ketones, ur 5 (*)    Protein, ur 100 (*)    All other components within normal limits  CBC WITH DIFFERENTIAL/PLATELET - Abnormal; Notable for the following components:   Hemoglobin 11.4 (*)    HCT 38.6 (*)    MCV 79.3 (*)    MCH 23.4 (*)    MCHC 29.5 (*)    RDW 18.6 (*)    Neutro Abs 7.9 (*)    All other components within normal limits  I-STAT CG4 LACTIC ACID, ED - Abnormal; Notable for the following components:   Lactic Acid,  Venous 2.1 (*)    All other components within normal limits  TROPONIN I (HIGH SENSITIVITY) - Abnormal; Notable for the following components:   Troponin I (High Sensitivity) 19 (*)    All other components within normal limits  RESP PANEL BY RT-PCR (RSV, FLU A&B, COVID)  RVPGX2  LIPASE, BLOOD  HEPATIC FUNCTION PANEL  BRAIN NATRIURETIC PEPTIDE  I-STAT CG4 LACTIC ACID, ED  TROPONIN I (HIGH SENSITIVITY)    Notable for labs stable  EKG   EKG Interpretation Date/Time:  Tuesday February 25 2023 11:07:47 EDT Ventricular Rate:  63 PR Interval:  47 QRS Duration:  187 QT Interval:  479 QTC Calculation: 491 R Axis:   -73  Text Interpretation: Sinus rhythm Short PR interval Nonspecific IVCD with LAD LVH with secondary repolarization abnormality Inferior infarct, old Anterior Q waves, possibly due to LVH similar to prior no stemi Confirmed by Tanda Rockers ([id number]) on 02/25/2023 3:28:56 PM         Imaging Studies ordered: I ordered imaging studies including CXR CT CAP I independently visualized the following imaging with scope of interpretation limited to determining acute life threatening conditions related to emergency care; findings noted above I independently visualized and interpreted imaging. I agree with the radiologist interpretation   Medicines ordered and prescription drug management: Meds ordered this encounter  Medications   ondansetron (ZOFRAN-ODT) disintegrating tablet 4 mg   sodium chloride 0.9 % bolus 1,000 mL   ondansetron (ZOFRAN) injection 4 mg   acetaminophen (TYLENOL) tablet 1,000 mg   iohexol (OMNIPAQUE) 350 MG/ML injection 75 mL   ketorolac (TORADOL) 15 MG/ML injection 15 mg   alum & mag hydroxide-simeth (MAALOX/MYLANTA) 200-200-20 MG/5ML suspension 30 mL   famotidine (PEPCID) IVPB 20 mg premix   HYDROcodone-acetaminophen (NORCO/VICODIN) 5-325 MG per tablet 1 tablet    -I have reviewed the patients home medicines and have made adjustments as  needed   Consultations Obtained: na   Cardiac Monitoring: The patient was maintained on a cardiac monitor.  I personally viewed and interpreted the cardiac monitored which showed an underlying rhythm of: NSR Continuous pulse oximetry interpreted by myself, 97% on RA.    Social Determinants of Health:  Diagnosis or treatment significantly limited by social determinants of health: former smoker THC use   Reevaluation: After the interventions noted above, I reevaluated the patient and found that they have improved  Co morbidities that complicate the patient evaluation  Past Medical History:  Diagnosis Date   Aortic stenosis, mild    Arthritis of hand    "just a little bit in both hands" (03/31/2012)   Asthma    "little bit" (03/31/2012)   Atrial fibrillation (HCC)    dx '04; DCCV '04, placed on flecainide, failed DCCV 04/2010, flecainide stopped; s/p successful A.fib ablation 01/31/12   CHF (congestive heart failure) (HCC)    Controlled type 2 diabetes mellitus without complication, without long-term current use of insulin (HCC) 12/13/2019   COPD (chronic obstructive pulmonary disease) (HCC)    DJD (degenerative joint disease)    Fatty liver    Fibromyalgia    GERD (gastroesophageal reflux disease)    Hyperlipidemia    Hypertension    Hypothyroidism    S/P radiation   Left atrial enlargement    LA size 45mm by echo 11/21/11   Mitral regurgitation    trivial   Obstructive sleep apnea    mild by sleep study 2013; pt stated he does not have a machine because "it wasn't bad enough for him to have one"   Pacemaker 03/31/2012   Pneumonia    last time 2023   Restless leg syndrome    Second degree AV block    Splenomegaly       Dispostion: Disposition decision including need for hospitalization was considered, and patient disposition  pending at time of sign out.    Final Clinical Impression(s) / ED Diagnoses Final diagnoses:  Arthralgia, unspecified joint  Pulmonary  nodule  Nausea and vomiting, unspecified vomiting type        Sloan Leiter, DO 02/25/23 1709

## 2023-02-28 ENCOUNTER — Telehealth (HOSPITAL_COMMUNITY): Payer: Self-pay | Admitting: Pharmacy Technician

## 2023-02-28 NOTE — Telephone Encounter (Signed)
Advanced Heart Failure Patient Advocate Encounter  The patient was approved for a [employer] grant that will help cover the cost of Jardiance, Spironolactone. Total amount awarded, $10,000. Eligibility, 02/04/23 - 02/03/24.  ID [id number]  BIN [id number]  PCN [id number]  Group [id number]  Called and provided billing information to the pharmacy. Called and spoke with the patient's wife.  Archer Asa, CPhT

## 2023-03-13 ENCOUNTER — Encounter (HOSPITAL_COMMUNITY): Payer: Self-pay

## 2023-03-13 ENCOUNTER — Telehealth (HOSPITAL_COMMUNITY): Payer: Self-pay

## 2023-03-13 NOTE — Telephone Encounter (Signed)
Advanced Heart Failure Patient Advocate Encounter  Medication Samples have been left at registration desk for patient pick up. Drug name: Elilquis 5 MG Qty: 4x 14 ct packages LOT: [id number] Exp.: 05/2024 SIG: Take 1 tablet by mouth twice daily   The patient has been instructed regarding the correct time, dose, and frequency of taking this medication, including desired effects and most common side effects.   Burnell Blanks, CPhT Rx Patient Advocate Phone: [phone]

## 2023-04-02 ENCOUNTER — Other Ambulatory Visit: Payer: Self-pay | Admitting: *Deleted

## 2023-04-02 ENCOUNTER — Telehealth: Payer: Self-pay | Admitting: *Deleted

## 2023-04-02 MED ORDER — BREZTRI AEROSPHERE 160-9-4.8 MCG/ACT IN AERO: 2.0000 | INHALATION_SPRAY | Freq: Two times a day (BID) | RESPIRATORY_TRACT | 4 refills | Status: DC

## 2023-04-02 NOTE — Telephone Encounter (Signed)
Received fax from [facility] requesting a new script for [facility], script printed for Dr. Delton Coombes to sign.

## 2023-04-03 ENCOUNTER — Encounter: Payer: Self-pay | Admitting: Orthopedic Surgery

## 2023-04-04 ENCOUNTER — Other Ambulatory Visit: Payer: Self-pay

## 2023-04-04 DIAGNOSIS — Z89511 Acquired absence of right leg below knee: Secondary | ICD-10-CM

## 2023-04-15 ENCOUNTER — Emergency Department (HOSPITAL_COMMUNITY): Payer: Medicare Other

## 2023-04-15 ENCOUNTER — Other Ambulatory Visit: Payer: Self-pay

## 2023-04-15 ENCOUNTER — Encounter (HOSPITAL_COMMUNITY): Payer: Self-pay

## 2023-04-15 ENCOUNTER — Emergency Department (HOSPITAL_COMMUNITY)
Admission: EM | Admit: 2023-04-15 | Discharge: 2023-04-15 | Disposition: A | Discharge status: Home / Self Care | Payer: Medicare Other | Attending: Emergency Medicine | Admitting: Emergency Medicine

## 2023-04-15 DIAGNOSIS — Z79899 Other long term (current) drug therapy: Secondary | ICD-10-CM | POA: Diagnosis not present

## 2023-04-15 DIAGNOSIS — Z23 Encounter for immunization: Secondary | ICD-10-CM | POA: Diagnosis not present

## 2023-04-15 DIAGNOSIS — J45909 Unspecified asthma, uncomplicated: Secondary | ICD-10-CM | POA: Insufficient documentation

## 2023-04-15 DIAGNOSIS — I4891 Unspecified atrial fibrillation: Secondary | ICD-10-CM | POA: Insufficient documentation

## 2023-04-15 DIAGNOSIS — Z7901 Long term (current) use of anticoagulants: Secondary | ICD-10-CM | POA: Diagnosis not present

## 2023-04-15 DIAGNOSIS — D649 Anemia, unspecified: Secondary | ICD-10-CM | POA: Insufficient documentation

## 2023-04-15 DIAGNOSIS — R1031 Right lower quadrant pain: Secondary | ICD-10-CM | POA: Insufficient documentation

## 2023-04-15 DIAGNOSIS — R1084 Generalized abdominal pain: Secondary | ICD-10-CM

## 2023-04-15 DIAGNOSIS — Z7951 Long term (current) use of inhaled steroids: Secondary | ICD-10-CM | POA: Diagnosis not present

## 2023-04-15 DIAGNOSIS — J449 Chronic obstructive pulmonary disease, unspecified: Secondary | ICD-10-CM | POA: Diagnosis not present

## 2023-04-15 DIAGNOSIS — W19XXXA Unspecified fall, initial encounter: Secondary | ICD-10-CM

## 2023-04-15 DIAGNOSIS — S51012A Laceration without foreign body of left elbow, initial encounter: Secondary | ICD-10-CM | POA: Insufficient documentation

## 2023-04-15 DIAGNOSIS — E039 Hypothyroidism, unspecified: Secondary | ICD-10-CM | POA: Diagnosis not present

## 2023-04-15 DIAGNOSIS — R0602 Shortness of breath: Secondary | ICD-10-CM

## 2023-04-15 DIAGNOSIS — I509 Heart failure, unspecified: Secondary | ICD-10-CM | POA: Diagnosis not present

## 2023-04-15 DIAGNOSIS — E119 Type 2 diabetes mellitus without complications: Secondary | ICD-10-CM | POA: Diagnosis not present

## 2023-04-15 DIAGNOSIS — W01198A Fall on same level from slipping, tripping and stumbling with subsequent striking against other object, initial encounter: Secondary | ICD-10-CM | POA: Diagnosis not present

## 2023-04-15 DIAGNOSIS — E860 Dehydration: Secondary | ICD-10-CM

## 2023-04-15 DIAGNOSIS — R600 Localized edema: Secondary | ICD-10-CM | POA: Diagnosis not present

## 2023-04-15 DIAGNOSIS — Z950 Presence of cardiac pacemaker: Secondary | ICD-10-CM | POA: Diagnosis not present

## 2023-04-15 DIAGNOSIS — S59902A Unspecified injury of left elbow, initial encounter: Secondary | ICD-10-CM | POA: Diagnosis present

## 2023-04-15 LAB — BRAIN NATRIURETIC PEPTIDE: B Natriuretic Peptide: 180.3 pg/mL — ABNORMAL HIGH (ref 0.0–100.0)

## 2023-04-15 LAB — URINALYSIS, ROUTINE W REFLEX MICROSCOPIC
Bacteria, UA: NONE SEEN
Bilirubin Urine: NEGATIVE
Glucose, UA: >=500 mg/dL — AB
Hgb urine dipstick: NEGATIVE
Ketones, ur: NEGATIVE mg/dL
Leukocytes,Ua: NEGATIVE
Nitrite: NEGATIVE
Protein, ur: 100 mg/dL — AB
Specific Gravity, Urine: 1.017 (ref 1.005–1.030)
pH: 5 (ref 5.0–8.0)

## 2023-04-15 LAB — CBC WITH DIFFERENTIAL/PLATELET
Abs Immature Granulocytes: 0.03 10*3/uL (ref 0.00–0.07)
Basophils Absolute: 0 10*3/uL (ref 0.0–0.1)
Basophils Relative: 0 %
Eosinophils Absolute: 0.1 10*3/uL (ref 0.0–0.5)
Eosinophils Relative: 2 %
HCT: 34.2 % — ABNORMAL LOW (ref 39.0–52.0)
Hemoglobin: 9.6 g/dL — ABNORMAL LOW (ref 13.0–17.0)
Immature Granulocytes: 0 %
Lymphocytes Relative: 12 %
Lymphs Abs: 0.8 10*3/uL (ref 0.7–4.0)
MCH: 22.1 pg — ABNORMAL LOW (ref 26.0–34.0)
MCHC: 28.1 g/dL — ABNORMAL LOW (ref 30.0–36.0)
MCV: 78.6 fL — ABNORMAL LOW (ref 80.0–100.0)
Monocytes Absolute: 0.7 10*3/uL (ref 0.1–1.0)
Monocytes Relative: 10 %
Neutro Abs: 5.4 10*3/uL (ref 1.7–7.7)
Neutrophils Relative %: 76 %
Platelets: 175 10*3/uL (ref 150–400)
RBC: 4.35 MIL/uL (ref 4.22–5.81)
RDW: 19.2 % — ABNORMAL HIGH (ref 11.5–15.5)
WBC: 7.1 10*3/uL (ref 4.0–10.5)
nRBC: 0 % (ref 0.0–0.2)

## 2023-04-15 LAB — COMPREHENSIVE METABOLIC PANEL
ALT: 15 U/L (ref 0–44)
AST: 20 U/L (ref 15–41)
Albumin: 3.4 g/dL — ABNORMAL LOW (ref 3.5–5.0)
Alkaline Phosphatase: 59 U/L (ref 38–126)
Anion gap: 11 (ref 5–15)
BUN: 21 mg/dL (ref 8–23)
CO2: 25 mmol/L (ref 22–32)
Calcium: 8.9 mg/dL (ref 8.9–10.3)
Chloride: 100 mmol/L (ref 98–111)
Creatinine, Ser: 2.07 mg/dL — ABNORMAL HIGH (ref 0.61–1.24)
GFR, Estimated: 33 mL/min — ABNORMAL LOW (ref >60)
Glucose, Bld: 108 mg/dL — ABNORMAL HIGH (ref 70–99)
Potassium: 4.4 mmol/L (ref 3.5–5.1)
Sodium: 136 mmol/L (ref 135–145)
Total Bilirubin: 0.6 mg/dL (ref <1.2)
Total Protein: 6.3 g/dL — ABNORMAL LOW (ref 6.5–8.1)

## 2023-04-15 LAB — POC OCCULT BLOOD, ED: Fecal Occult Bld: NEGATIVE

## 2023-04-15 LAB — TROPONIN I (HIGH SENSITIVITY)
Troponin I (High Sensitivity): 15 ng/L (ref <18)
Troponin I (High Sensitivity): 14 ng/L (ref <18)

## 2023-04-15 MED ORDER — SODIUM CHLORIDE 0.9 % IV BOLUS: 500.0000 mL | Freq: Once | INTRAVENOUS | Status: DC

## 2023-04-15 MED ORDER — IPRATROPIUM-ALBUTEROL 0.5-2.5 (3) MG/3ML IN SOLN
3.0000 mL | Freq: Once | RESPIRATORY_TRACT | Status: AC
  Administered 2023-04-15: 3 mL via RESPIRATORY_TRACT
  Filled 2023-04-15: qty 3

## 2023-04-15 MED ORDER — TETANUS-DIPHTH-ACELL PERTUSSIS 5-2.5-18.5 LF-MCG/0.5 IM SUSY
0.5000 mL | PREFILLED_SYRINGE | Freq: Once | INTRAMUSCULAR | Status: AC
  Administered 2023-04-15: 0.5 mL via INTRAMUSCULAR
  Filled 2023-04-15: qty 0.5

## 2023-04-15 MED ORDER — SODIUM CHLORIDE 0.9 % IV BOLUS
1000.0000 mL | Freq: Once | INTRAVENOUS | Status: AC
  Administered 2023-04-15: 1000 mL via INTRAVENOUS

## 2023-04-15 MED ORDER — IOHEXOL 350 MG/ML SOLN
60.0000 mL | Freq: Once | INTRAVENOUS | Status: AC | PRN
  Administered 2023-04-15: 60 mL via INTRAVENOUS

## 2023-04-15 NOTE — ED Triage Notes (Addendum)
Pt arrived d/t fall on Eliquis.  Pt did not hit head - pt c/o pain in left elbow / forearm.  Pt states it is just a large skin tear.  Bleeding is controlled with his bandage.  Pt is on 2L Home O2.  Pt states he does not want an Xray - "I know it is not broken"

## 2023-04-15 NOTE — ED Provider Notes (Signed)
[place] EMERGENCY DEPARTMENT AT [facility] Provider Note   CSN: [id number] Arrival date & time: 04/15/23  0430     History  Chief Complaint  Patient presents with   Marletta Lor    [name] is a 72 y.o. male with past medical history of right BKA, GERD, HLD, hypothyroidism, OSA, pacemaker, asthma, COPD, A-fib (on Eliquis), CHF, non-insulin-dependent T2DM presents to emergency department for evaluation of pain in left elbow following an unwitnessed fall on Sunday morning.  He reports that he fell asleep in his wheelchair and fell forward causing the wheelchair to roll back behind him.  Patient's wife heard him fall and had to assist him getting up.  She denies seizure-like activity, confusion following fall.  He has a skin tear to left elbow but has no other complaints of pain or injury.  He denies head injury, neck pain, headache, confusion, vomiting.  Additionally, he complains of shortness of breath that started last night following fall.  He is normally on 2 L O2 via nasal cannula for COPD.  He also endorses worsening abdominal distention and has not been able to be supine for "months".  He reports that he had a 30-minute episode of chest tightness while sitting down for breakfast this morning that resolved on its own prior to arrival to ED but continues to feel SOB at rest in ED.  He denies fevers, hemoptysis.  He has a chronic cough at baseline due to COPD.  He has had a clot in the past which he believes was in his leg.  Fall Associated symptoms include chest pain and shortness of breath. Pertinent negatives include no abdominal pain and no headaches.     Home Medications Prior to Admission medications   Medication Sig Start Date End Date Taking? Authorizing Provider  acetaminophen (TYLENOL) 325 MG tablet Take 2 tablets (650 mg total) by mouth every 6 (six) hours as needed. Patient taking differently: Take 650 mg by mouth every 6 (six) hours as needed for mild pain  (pain score 1-3) or moderate pain (pain score 4-6). 12/24/22  Yes Lockie Mola, MD  albuterol (PROVENTIL HFA;VENTOLIN HFA) 108 (90 Base) MCG/ACT inhaler Inhale 2 puffs into the lungs every 6 (six) hours as needed for wheezing or shortness of breath. 12/28/15  Yes Leslye Peer, MD  amLODipine (NORVASC) 5 MG tablet Take 2.5 mg by mouth in the morning.   Yes [provider]  apixaban (ELIQUIS) 5 MG TABS tablet Take 1 tablet (5 mg total) by mouth 2 (two) times daily. 08/21/22  Yes Graciella Freer, PA-C  bisoprolol (ZEBETA) 5 MG tablet Take 1 tablet (5 mg total) by mouth daily. Please call office to arrange follow up appointment 11/29/22  Yes Bensimhon, Bevelyn Buckles, MD  Budeson-Glycopyrrol-Formoterol (BREZTRI AEROSPHERE) 160-9-4.8 MCG/ACT AERO Inhale 2 puffs into the lungs in the morning and at bedtime. 05/23/22  Yes Leslye Peer, MD  Cholecalciferol (VITAMIN D) 50 MCG (2000 UT) tablet Take 2 tablets (4,000 Units total) by mouth daily. Patient taking differently: Take 8,000 Units by mouth at bedtime. 12/24/22  Yes Lockie Mola, MD  empagliflozin (JARDIANCE) 10 MG TABS tablet Take 1 tablet (10 mg total) by mouth daily. Patient taking differently: Take 10 mg by mouth at bedtime. 05/27/22  Yes Bensimhon, Bevelyn Buckles, MD  gabapentin (NEURONTIN) 300 MG capsule Take 2 capsules (600 mg total) by mouth 3 (three) times daily. for neuropathy Patient taking differently: Take 600 mg by mouth 2 (two) times daily. for  neuropathy 10/19/22  Yes Rai, Ripudeep K, MD  levothyroxine (SYNTHROID, LEVOTHROID) 175 MCG tablet Take 175 mcg by mouth daily before breakfast.   Yes [provider]  Menthol-Camphor (TIGER BALM ARTHRITIS RUB EX) Apply 1 application topically as needed (for leg pain).   Yes [provider]  montelukast (SINGULAIR) 10 MG tablet TAKE 1 TABLET(10 MG) BY MOUTH AT BEDTIME Patient taking differently: Take 10 mg by mouth at bedtime. 06/27/22  Yes Leslye Peer, MD  Multiple  Minerals-Vitamins (CAL-MAG-ZINC-D) TABS Take 1 tablet by mouth at bedtime.   Yes [provider]  Multiple Vitamin (MULTIVITAMIN WITH MINERALS) TABS tablet Take 1 tablet by mouth daily. 12/15/19  Yes Sheikh, Omair Latif, DO  ondansetron (ZOFRAN) 4 MG tablet Take 1 tablet (4 mg total) by mouth every 6 (six) hours. Patient taking differently: Take 4 mg by mouth daily as needed for nausea or vomiting. 01/31/23  Yes Elayne Snare K, DO  oxyCODONE-acetaminophen (PERCOCET/ROXICET) 5-325 MG tablet Take 1 tablet by mouth every 8 (eight) hours as needed for severe pain. Patient taking differently: Take 2 tablets by mouth daily. 12/31/22  Yes [name], MD  OXYGEN Inhale 2 L/min into the lungs continuous.   Yes [provider]  pantoprazole (PROTONIX) 40 MG tablet Take 40 mg by mouth daily before breakfast.   Yes [provider]  Polyethyl Glyc-Propyl Glyc PF (SYSTANE ULTRA PF) 0.4-0.3 % SOLN Place 2 drops into both eyes daily as needed (dry eyes).   Yes [provider]  polyethylene glycol (MIRALAX / GLYCOLAX) 17 g packet Take 17 g by mouth 2 (two) times daily as needed. Patient taking differently: Take 17 g by mouth 3 (three) times a week. 12/19/22  Yes Pricilla Loveless, MD  potassium chloride SA (KLOR-CON M) 20 MEQ tablet Take 20 mEq by mouth See admin instructions. Take 20 meq twice daily every other day   Yes [provider]  predniSONE (DELTASONE) 2.5 MG tablet TAKE 1 TABLET(2.5 MG) BY MOUTH DAILY WITH BREAKFAST Patient taking differently: Take 2.5 mg by mouth daily with breakfast. 02/24/23  Yes Byrum, Les Pou, MD  rOPINIRole (REQUIP) 1 MG tablet Take 1 tablet (1 mg total) by mouth in the morning and at bedtime. Patient taking differently: Take 1 mg by mouth in the morning. 01/23/20  Yes Glade Lloyd, MD  rosuvastatin (CRESTOR) 10 MG tablet Take 10 mg by mouth at bedtime.  08/08/16  Yes [provider]  senna-docusate (SENOKOT-S) 8.6-50 MG  tablet Take 1 tablet by mouth 2 (two) times daily.   Yes [provider]  spironolactone (ALDACTONE) 25 MG tablet Take 1 tablet (25 mg total) by mouth daily. 02/16/16  Yes Graciella Freer, PA-C  torsemide (DEMADEX) 10 MG tablet Take 10 mg by mouth every other day.   Yes [provider]  torsemide (DEMADEX) 20 MG tablet Take 20 mg by mouth every other day.   Yes [provider]  hydrOXYzine (ATARAX) 10 MG tablet TAKE 1 TABLET(10 MG) BY MOUTH THREE TIMES DAILY AS NEEDED Patient not taking: Reported on 04/15/2023 12/04/22   [name], MD      Allergies    Cephalexin, Meloxicam, and Vancomycin    Review of Systems   Review of Systems  Constitutional:  Negative for chills, fatigue and fever.  Respiratory:  Positive for chest tightness and shortness of breath. Negative for cough and wheezing.   Cardiovascular:  Positive for chest pain. Negative for palpitations.  Gastrointestinal:  Negative for  abdominal pain, constipation, diarrhea, nausea and vomiting.  Musculoskeletal:  Negative for neck pain.  Neurological:  Negative for dizziness, seizures, weakness, light-headedness, numbness and headaches.    Physical Exam Updated Vital Signs BP 134/69 (BP Location: Right Arm)   Pulse 60   Temp 97.6 F (36.4 C) (Oral)   Resp 11   Ht 5\' 11"  (1.803 m)   Wt 83.9 kg   SpO2 100%   BMI 25.80 kg/m  Physical Exam Vitals and nursing note reviewed.  Constitutional:      General: He is not in acute distress.    Appearance: Normal appearance. He is not ill-appearing or diaphoretic.  HENT:     Head: Normocephalic and atraumatic. No raccoon eyes or Battle's sign.     Right Ear: Hearing normal. No hemotympanum.     Left Ear: Hearing normal. No hemotympanum.     Nose: No nasal deformity, septal deviation or nasal tenderness.     Right Nostril: No epistaxis or septal hematoma.     Left Nostril: No epistaxis or septal hematoma.  Eyes:     General: Lids are normal.  Vision grossly intact. No visual field deficit or scleral icterus.    Extraocular Movements:     Right eye: Normal extraocular motion and no nystagmus.     Left eye: Normal extraocular motion and no nystagmus.     Conjunctiva/sclera: Conjunctivae normal.     Right eye: Right conjunctiva is not injected. No hemorrhage.    Left eye: Left conjunctiva is not injected. No hemorrhage. Cardiovascular:     Rate and Rhythm: Normal rate.     Pulses:          Dorsalis pedis pulses are 2+ on the left side.  Pulmonary:     Effort: Pulmonary effort is normal. No respiratory distress.  Chest:     Chest wall: No tenderness.  Abdominal:     General: Bowel sounds are normal. There is distension.     Tenderness: There is abdominal tenderness in the right lower quadrant and suprapubic area. There is no right CVA tenderness, left CVA tenderness, guarding or rebound. Negative signs include Murphy's sign, Rovsing's sign and McBurney's sign.     Hernia: A hernia is present. Hernia is present in the umbilical area.  Genitourinary:    Rectum: Guaiac result negative. No tenderness, external hemorrhoid or internal hemorrhoid. Normal anal tone.     Comments: No gross hemorrhage Musculoskeletal:        General: No swelling, tenderness or deformity. Normal range of motion.     Cervical back: Normal range of motion and neck supple. No rigidity or tenderness.     Left lower leg: 1+ Edema present.     Right Lower Extremity: Right leg is amputated below knee.  Skin:    Capillary Refill: Capillary refill takes less than 2 seconds.     Coloration: Skin is not jaundiced or pale.     Comments: Superficial skin tear to left elbow without active hemorrhage  Neurological:     Mental Status: He is alert and oriented to person, place, and time. Mental status is at baseline.     Cranial Nerves: No cranial nerve deficit.     Sensory: No sensory deficit.     Motor: No weakness.     Coordination: Coordination normal.     Deep  Tendon Reflexes: Reflexes normal.    ED Results / Procedures / Treatments   Labs (all labs ordered are listed, but only abnormal results  are displayed) Labs Reviewed  CBC WITH DIFFERENTIAL/PLATELET - Abnormal; Notable for the following components:      Result Value   Hemoglobin 9.6 (*)    HCT 34.2 (*)    MCV 78.6 (*)    MCH 22.1 (*)    MCHC 28.1 (*)    RDW 19.2 (*)    All other components within normal limits  COMPREHENSIVE METABOLIC PANEL - Abnormal; Notable for the following components:   Glucose, Bld 108 (*)    Creatinine, Ser 2.07 (*)    Total Protein 6.3 (*)    Albumin 3.4 (*)    GFR, Estimated 33 (*)    All other components within normal limits  BRAIN NATRIURETIC PEPTIDE - Abnormal; Notable for the following components:   B Natriuretic Peptide 180.3 (*)    All other components within normal limits  POC OCCULT BLOOD, ED  TROPONIN I (HIGH SENSITIVITY)  TROPONIN I (HIGH SENSITIVITY)    EKG EKG Interpretation Date/Time:  Tuesday April 15 2023 10:29:15 EST Ventricular Rate:  60 PR Interval:  123 QRS Duration:  174 QT Interval:  480 QTC Calculation: 480 R Axis:   -80  Text Interpretation: Sinus or ectopic atrial rhythm Nonspecific IVCD with LAD LVH with secondary repolarization abnormality Confirmed by Virgina Norfolk [phone]) on 04/15/2023 10:31:33 AM  Radiology DG Chest 2 View Result Date: 04/15/2023 CLINICAL DATA:  Shortness of breath. EXAM: CHEST - 2 VIEW COMPARISON:  02/25/2023. FINDINGS: Bilateral lung fields are clear. Bilateral costophrenic angles are clear. Stable cardio-mediastinal silhouette. Sternotomy wires, left-sided dual lead cardiac, prosthetic aortic valve and left atrial appendage closure device are again seen. No acute osseous abnormalities. The soft tissues are within normal limits. IMPRESSION: No active cardiopulmonary disease. Electronically Signed   By: Jules Schick M.D.   On: 04/15/2023 12:23    Procedures Procedures    Medications  Ordered in ED Medications  sodium chloride 0.9 % bolus 500 mL (has no administration in time range)  Tdap (BOOSTRIX) injection 0.5 mL (has no administration in time range)  ipratropium-albuterol (DUONEB) 0.5-2.5 (3) MG/3ML nebulizer solution 3 mL (3 mLs Nebulization Given 04/15/23 1457)  iohexol (OMNIPAQUE) 350 MG/ML injection 60 mL (60 mLs Intravenous Contrast Given 04/15/23 1434)    ED Course/ Medical Decision Making/ A&P                                 Medical Decision Making Amount and/or Complexity of Data Reviewed Labs: ordered. Radiology: ordered.  Risk Prescription drug management.   Patient presents to the ED for concern of shortness of breath and chest tightness, this involves an extensive number of treatment options, and is a complaint that carries with it a high risk of complications and morbidity.  The differential diagnosis includes ACS, unstable angina, pneumonia, PE, CHF, ascites, intra-abdominal abnormality   Co morbidities that complicate the patient evaluation  GERD, HLD, hypothyroidism, OSA, pacemaker, asthma, COPD, A-fib (on Eliquis), CHF, non-insulin-dependent T2DM    Additional history obtained:  Additional history obtained from  Family, Nursing, Outside Medical Records, and Past Admission   External records from outside source obtained and reviewed including triage RN note   Lab Tests:  I Ordered, and personally interpreted labs.  The pertinent results include:  BNP 180 (baseline 200-406 within past 3 years)   Imaging Studies ordered:  I ordered imaging studies including CXR and CT abd pelvis  I independently visualized and interpreted imaging which showed  no CXR, PTX, effusion, nor PNA CT abd pelvis pending at sign out I agree with the radiologist interpretation   Cardiac Monitoring:  The patient was maintained on a cardiac monitor.  I personally viewed and interpreted the cardiac monitored which showed an underlying rhythm of:  IVCD   Medicines ordered and prescription drug management:  I ordered medication including duoneb  for wheezing  Reevaluation of the patient after these medicines showed that the patient improved I have reviewed the patients home medicines and have made adjustments as needed    Problem List / ED Course:  Fall, initial encounter Larey Seat off wheelchair  -  low mechanism Patient adamantly refuses imaging for fall as he reports he did not hit his head or hit his left elbow. He refused imaging in triage as well as noted on their note. I discussed importance of imaging as he is on Eliquis and it was not witnessed without knowing if head was injured, however he continues to refuse CT head, cervical neck, XR of left elbow Physical exam is reassuring for no significant head injury and he is neurologically intact. Abd Pain TTP of suprapubic and RLQ pain CT with similar abd pain from 02/25/23 neg for acute abnormality  CT pending at sign out Shortness of breath Lung sounds are significant for rhonchi with mild expiratory wheeze No hypoxia nor need to increase O2 supplementation. Will provide DuoNeb and reassess BNP 180 CXR neg for PNA, PTX, effusion Likely due to acute on chronic COPD exacerbation  Chest tightness Labs significant for BNP of 180.  Tropes x 2 negative Chest x-ray negative for PTX, effusion or pneumonia Do not feel that this is ACS and likely related to COPD Anemia Hgb 9.6 with hgb 11.4 one month ago He denies hematochezia, dark stool Occult neg Discussed follow up with PCP regarding anemia. Encouraged OTC iron supplementation until next PCP appointment   Reevaluation:  After the interventions noted above, I reevaluated the patient and found that they have :improved   Social Determinants of Health:  Has pcp, cardiology f/u   Dispostion:  Will reassess [facility] following neb, however with reassuring workup, likely due to COPD and will be able to be discharged to follow  up with his PCP or pulmonology.  Final Clinical Impression(s) / ED Diagnoses Final diagnoses:  Anemia, unspecified type  Fall, initial encounter  Shortness of breath    Rx / DC Orders ED Discharge Orders     None         Judithann Sheen, PA 04/15/23 1547    Virgina Norfolk, DO 04/16/23 [phone]

## 2023-04-15 NOTE — ED Provider Notes (Signed)
Handoff from [employer]. Pending CT abd/pelvis. Pt refused head CT and left elbow xray.  DC if CT abd/pelvis WNL. Cleared chest pain wise, pending CT abd/pelvis for dispo.   S/p superficial wound to L elbow.  Physical Exam  BP 134/69 (BP Location: Right Arm)   Pulse 60   Temp 97.6 F (36.4 C) (Oral)   Resp 11   Ht 5\' 11"  (1.803 m)   Wt 83.9 kg   SpO2 100%   BMI 25.80 kg/m   Physical Exam Vitals and nursing note reviewed.  Constitutional:      General: He is not in acute distress.    Appearance: He is well-developed.  HENT:     Head: Normocephalic and atraumatic.  Eyes:     Conjunctiva/sclera: Conjunctivae normal.  Cardiovascular:     Rate and Rhythm: Normal rate and regular rhythm.     Heart sounds: No murmur heard. Pulmonary:     Effort: Pulmonary effort is normal. No respiratory distress.     Breath sounds: Normal breath sounds.  Abdominal:     Palpations: Abdomen is soft.     Tenderness: There is generalized abdominal tenderness. There is no guarding or rebound.  Musculoskeletal:        General: No swelling.     Cervical back: Neck supple.  Skin:    General: Skin is warm and dry.     Capillary Refill: Capillary refill takes less than 2 seconds.     Comments: No ecchymoses to chest/abd/pelvis or back. Superficial abrasions to L posterior forearm/elbow  Neurological:     Mental Status: He is alert.  Psychiatric:        Mood and Affect: Mood normal.     Procedures  Procedures  ED Course / MDM    Medical Decision Making I was handed off this patient, pending CT abd/pelvis, and informed that if the CT ab pelvis was clear, the patient was medically cleared I discussed thoroughly, with his complaint of chest pain, he described as tightness, and only when he has been coughing so frequently.  He states that his shortness of breath, is at baseline, and he was in need of his inhaler, when he got here, as he typically does a nebulizer at this time.  He states that shortness  of breath has not been worse than usual, and states that he has not had increased wheezing, sputum, or cough.  He states that his baseline, and he is working with his pulmonologist, to get it better under control.  He is wheezy on my exam, we will give him another nebulizer.  And also dose of prednisone.  I informed him he will need to follow up with his pulmonologist.  He has been compliant with his Eliquis, and he is not tachypneic, and nontachycardic.  Overall we will well-appearing.  I believe that his shortness of breath/chest tightness is likely more related to his COPD.  As he has been compliant with his blood thinner.  Chest x-ray is clear, troponins are within normal limits.  He states he is feeling little bit better after the DuoNeb.  Additionally he had a complaint of generalized abdominal distention, I reviewed his CT, there are no acute findings, he is the renal atrophy, which has been seen on multiple other scans.  He has no evidence of a Jameriah Trotti bowel obstruction, this may be secondary to a pannus/habitus.  I discussed that he will need to follow-up with his PCP in regards to this.  He did  have an elevation, of his creatinine, and thus he was given 1 L fluids, and instructed to follow-up with his PCP, for repeat labs, the next week.  And also to increase his water intake, for further hydration.  His tetanus was updated given his wounds, and his elbow.  He did have a drop in 2 of 2 and his hemoglobin, but denies any black stools.  Previous provider performed Hemoccult, which was negative.  Informed patient to follow-up with PCP.  We discussed return precautions and he was discharged home.  He has no neurodeficits on my exam, no wound, on his scalp, or his head.  He declined a head CT, and an x-ray.  Denies that he hit his head.  We discussed return precautions and he voiced understanding.  Discharged home  Amount and/or Complexity of Data Reviewed Labs: ordered. Radiology:  ordered.  Risk Prescription drug management.         Pete Pelt, [place] 04/15/23 1624    Alvira Monday, MD 04/17/23 1007

## 2023-04-15 NOTE — Discharge Instructions (Addendum)
Thank you for letting us evaluate you today.  Your chest pain is not related to heart strain.  Your lab work was significant for a mildly elevated fluid level however at the least you have had over the past 3 months.  You also had a mildly elevated kidney function which is likely due to dehydration to be supplemented some fluids for that.  In regards to fall, we gave you a Tdap injection to prophylactically treat for tetanus.  Please make sure to follow-up with your primary care provider as soon as possible regarding anemia.  Here in the emergency department your stool was negative for blood however I do think this needs to be further worked up.  Please take iron supplementation over-the-counter until you have that appointment.  Also on your last CT you had a pulmonary nodule that was supposed to be reevaluated within 6 to 12 months by your PCP so make sure to remind them of this as well.  Additionally you appear to be a little bit dehydrated today, we gave you IV fluids, however you will need to get your kidney function rechecked, in the next week.   Return to Emergency Department if you experience worsening symptoms, recurrence of chest pain, shortness of breath.

## 2023-04-17 ENCOUNTER — Ambulatory Visit: Payer: Medicare Other | Admitting: Orthopedic Surgery

## 2023-04-17 ENCOUNTER — Encounter: Payer: Self-pay | Admitting: Orthopedic Surgery

## 2023-04-17 DIAGNOSIS — Z89511 Acquired absence of right leg below knee: Secondary | ICD-10-CM | POA: Diagnosis not present

## 2023-04-17 NOTE — Progress Notes (Signed)
Office Visit Note   Patient: [name]           Date of Birth: October 14, 1950           MRN: [id number] Visit Date: 04/17/2023              Requested by: Alysia Penna, MD [address] [place],  [place] [address] PCP: Alysia Penna, MD  Chief Complaint  Patient presents with   Right Leg - Follow-up    10/09/2022 revision right BKA  11/16/2022 revision right BKA       HPI: Patient is a 72 year old gentleman 5 months status post revision right below-knee amputation.  Assessment & Plan: Visit Diagnoses:  1. S/P BKA (below knee amputation), right [facility])     Plan: Patient will follow-up with [facility] next week for prosthetic fitting.  Prescription provided for gait training upstairs.  Follow-Up Instructions: Return in about 2 months (around 06/18/2023).   Ortho Exam  Patient is alert, oriented, no adenopathy, well-dressed, normal affect, normal respiratory effort. Examination the residual limb is well-healed.  Patient does have swelling the stump shrinker was applied and proper application and use of the stump shrinker was provided.  Imaging: No results found. No images are attached to the encounter.  Labs: Lab Results  Component Value Date   HGBA1C 6.3 (H) 11/13/2022   HGBA1C 6.5 (H) 12/27/2021   HGBA1C 6.6 (H) 06/11/2021   ESRSEDRATE 60 (H) 09/04/2015   REPTSTATUS 10/22/2022 FINAL 10/17/2022   GRAMSTAIN NO WBC SEEN NO ORGANISMS SEEN  10/09/2022   CULT  10/17/2022    NO GROWTH 5 DAYS Performed at [facility], [address], [place], [place] [address]    [facility] STAPHYLOCOCCUS AUREUS 10/09/2022   LABORGA STAPHYLOCOCCUS EPIDERMIDIS 10/09/2022     Lab Results  Component Value Date   ALBUMIN 3.4 (L) 04/15/2023   ALBUMIN 3.9 02/25/2023   ALBUMIN 3.5 01/31/2023   PREALBUMIN 25 09/04/2022    Lab Results  Component Value Date   MG 2.4 12/23/2022   MG 2.0 06/11/2021   MG 2.3 12/10/2020   No results found for: "VD25OH"  Lab Results   Component Value Date   PREALBUMIN 25 09/04/2022      Latest Ref Rng & Units 04/15/2023   11:15 AM 02/25/2023   10:05 AM 01/31/2023    8:50 AM  CBC EXTENDED  WBC 4.0 - 10.5 K/uL 7.1  9.8    9.6  10.4   RBC 4.22 - 5.81 MIL/uL 4.35  4.84    4.87  4.40   Hemoglobin 13.0 - 17.0 g/dL 9.6  01.0    27.2  53.6   HCT 39.0 - 52.0 % 34.2  38.5    38.6  35.6   Platelets 150 - 400 K/uL 175  206    207  208   NEUT# 1.7 - 7.7 K/uL 5.4  7.9    Lymph# 0.7 - 4.0 K/uL 0.8  0.8       There is no height or weight on file to calculate BMI.  Orders:  Orders Placed This Encounter  Procedures   Ambulatory referral to Physical Therapy   No orders of the defined types were placed in this encounter.    Procedures: No procedures performed  Clinical Data: No additional findings.  ROS:  All other systems negative, except as noted in the HPI. Review of Systems  Objective: Vital Signs: There were no vitals taken for this visit.  Specialty Comments:  No specialty  comments available.  PMFS History: Patient Active Problem List   Diagnosis Date Noted   AKI (acute kidney injury) (HCC) 12/22/2022   Fall 11/14/2022   Pain of right lower extremity due to injury 11/14/2022   Diabetic ulcer of lower leg (HCC) 11/13/2022   Osteomyelitis (HCC) 11/13/2022   Dyslipidemia 10/18/2022   Cellulitis of right lower extremity 10/17/2022   Dehiscence of amputation stump of right lower extremity (HCC) 10/09/2022   Below-knee amputation of right lower extremity (HCC) 10/09/2022   Gangrene of right foot (HCC) 09/04/2022   History of below-knee amputation of right lower extremity (HCC) 09/04/2022   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) 08/23/2022   CAP (community acquired pneumonia) 12/28/2021   Constipation 12/28/2021   Pain in both lower extremities 07/03/2021   Peripheral neuropathy 07/03/2021   Acute metabolic encephalopathy 06/09/2021   Marijuana dependence (HCC) 06/09/2021   DNR (do not  resuscitate) 06/09/2021   Chest pain 06/08/2021   Renal lesion 02/14/2021   Status post cryoablation 02/14/2021   Allergic rhinitis 12/08/2020   Preop pulmonary/respiratory exam 03/27/2020   Acute on chronic diastolic (congestive) heart failure (HCC) 01/22/2020   Abdominal pain 01/22/2020   Hypothyroidism 12/13/2019   DM2 (diabetes mellitus, type 2) (HCC) 12/13/2019   Chronic kidney disease, stage 3b (HCC) 12/13/2019   Acute bacterial bronchitis 12/13/2019   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   Typical atrial flutter (HCC)    Chronic respiratory failure with hypoxia (HCC) 05/23/2016   CAD (coronary artery disease) 05/23/2016   Persistent atrial fibrillation (HCC)    Atypical atrial flutter (HCC)    Visit for monitoring Tikosyn therapy 04/16/2016   Chronic diastolic CHF (congestive heart failure) (HCC) 02/03/2016   Cough 12/06/2015   Acute on chronic respiratory failure with hypoxia (HCC) 09/08/2015   Hallucinations 09/04/2015   Lymphadenopathy 09/04/2015   Ascending aortic aneurysm (HCC) 09/04/2015   Hypoxia 09/02/2015   S/P aortic valve replacement with bioprosthetic valve 09/02/2015   Pleural effusion 09/02/2015   S/P AVR 08/22/2015   GERD without esophagitis 09/20/2014   Intrinsic asthma 10/13/2013   Dyspnea 09/15/2013   Pulmonary hypertension (HCC) 09/15/2013   Shortness of breath 05/05/2013   Pacemaker-St.Jude 04/01/2012   Bradycardia 03/31/2012   Second degree AV block 03/31/2012   AV block, 1st degree 02/01/2012   PAF (paroxysmal atrial fibrillation) (HCC) 11/13/2011   Fatigue 11/13/2011   Essential hypertension 11/13/2011   Aortic stenosis 11/13/2011   Sleep apnea 11/13/2011   Past Medical History:  Diagnosis Date   Aortic stenosis, mild    Arthritis of hand    "just a little bit in both hands" (03/31/2012)   Asthma    "little bit" (03/31/2012)   Atrial fibrillation (HCC)    dx '04; DCCV '04, placed on flecainide, failed DCCV 04/2010, flecainide stopped; s/p  successful A.fib ablation 01/31/12   CHF (congestive heart failure) (HCC)    Controlled type 2 diabetes mellitus without complication, without long-term current use of insulin (HCC) 12/13/2019   COPD (chronic obstructive pulmonary disease) (HCC)    DJD (degenerative joint disease)    Fatty liver    Fibromyalgia    GERD (gastroesophageal reflux disease)    Hyperlipidemia    Hypertension    Hypothyroidism    S/P radiation   Left atrial enlargement    LA size 45mm by echo 11/21/11   Mitral regurgitation    trivial   Obstructive sleep apnea    mild by sleep study 2013; pt stated he does  not have a machine because "it wasn't bad enough for him to have one"   Pacemaker 03/31/2012   Pneumonia    last time 2023   Restless leg syndrome    Second degree AV block    Splenomegaly     Family History  Problem Relation Age of Onset   Heart failure Mother    Stroke Father    Neuropathy Neg Hx     Past Surgical History:  Procedure Laterality Date   AMPUTATION Right 08/23/2022   Procedure: RIGHT FOOT FIFTH RAY AMPUTATION;  Surgeon: [name], MD;  Location: [facility] OR;  Service: Orthopedics;  Laterality: Right;   AMPUTATION Right 09/04/2022   Procedure: RIGHT BELOW KNEE AMPUTATION;  Surgeon: [name], MD;  Location: [facility] OR;  Service: Orthopedics;  Laterality: Right;   AORTIC VALVE REPLACEMENT N/A 08/22/2015   Procedure: AORTIC VALVE REPLACEMENT (AVR);  Surgeon: Kerin Perna, MD;  Location: [facility] OR;  Service: Open Heart Surgery;  Laterality: N/A;   ATRIAL FIBRILLATION ABLATION  01/30/2012   PVI by Dr. Johney Frame   ATRIAL FIBRILLATION ABLATION N/A 01/31/2012   Procedure: ATRIAL FIBRILLATION ABLATION;  Surgeon: [name], MD;  Location: [facility] CATH LAB;  Service: Cardiovascular;  Laterality: N/A;   ATRIAL FIBRILLATION ABLATION N/A 04/19/2020   Procedure: ATRIAL FIBRILLATION ABLATION;  Surgeon: [name], MD;  Location: [facility] INVASIVE CV LAB;  Service: Cardiovascular;  Laterality: N/A;   BACK  SURGERY     X 3   BIOPSY  01/01/2022   Procedure: BIOPSY;  Surgeon: Kathi Der, MD;  Location: [facility] ENDOSCOPY;  Service: Gastroenterology;;   CARDIAC CATHETERIZATION N/A 08/09/2015   Procedure: Right/Left Heart Cath and Coronary Angiography;  Surgeon: Peter M Swaziland, MD;  Location: [facility] INVASIVE CV LAB;  Service: Cardiovascular;  Laterality: N/A;   CARDIAC CATHETERIZATION N/A 02/05/2016   Procedure: Right/Left Heart Cath and Coronary Angiography;  Surgeon: Corky Crafts, MD;  Location: [facility] INVASIVE CV LAB;  Service: Cardiovascular;  Laterality: N/A;   CARDIAC CATHETERIZATION N/A 04/17/2016   Procedure: Right Heart Cath;  Surgeon: Dolores Patty, MD;  Location: [facility] INVASIVE CV LAB;  Service: Cardiovascular;  Laterality: N/A;   CARDIOVASCULAR STRESS TEST  03/12/2002   EF 48%, NO EVIDENCE OF ISCHEMIA   CARDIOVERSION  01/2012; 03/31/2012   CARDIOVERSION N/A 02/25/2012   Procedure: CARDIOVERSION;  Surgeon: [name], MD;  Location: [facility] CATH LAB;  Service: Cardiovascular;  Laterality: N/A;   CARDIOVERSION N/A 03/31/2012   Procedure: CARDIOVERSION;  Surgeon: [name], MD;  Location: [facility] CATH LAB;  Service: Cardiovascular;  Laterality: N/A;   CARDIOVERSION N/A 11/23/2015   Procedure: CARDIOVERSION;  Surgeon: Laurey Morale, MD;  Location: [facility] ENDOSCOPY;  Service: Cardiovascular;  Laterality: N/A;   CARDIOVERSION N/A 04/08/2016   Procedure: CARDIOVERSION;  Surgeon: Dolores Patty, MD;  Location: [facility] ENDOSCOPY;  Service: Cardiovascular;  Laterality: N/A;   CARDIOVERSION N/A 09/14/2018   Procedure: CARDIOVERSION;  Surgeon: Wendall Stade, MD;  Location: [facility] ENDOSCOPY;  Service: Cardiovascular;  Laterality: N/A;   CARDIOVERSION N/A 10/19/2019   Procedure: CARDIOVERSION;  Surgeon: Dolores Patty, MD;  Location: [facility] ENDOSCOPY;  Service: Cardiovascular;  Laterality: N/A;   CARDIOVERSION N/A 02/25/2020   Procedure: CARDIOVERSION;  Surgeon: Pricilla Riffle, MD;  Location: [facility] ENDOSCOPY;   Service: Cardiovascular;  Laterality: N/A;   CARDIOVERSION N/A 05/25/2020   Procedure: CARDIOVERSION;  Surgeon: Lewayne Bunting, MD;  Location: [facility] ENDOSCOPY;  Service: Cardiovascular;  Laterality: N/A;   CARDIOVERSION N/A 07/10/2020  Procedure: CARDIOVERSION;  Surgeon: Jake Bathe, MD;  Location: [facility] ENDOSCOPY;  Service: Cardiovascular;  Laterality: N/A;   CLIPPING OF ATRIAL APPENDAGE N/A 08/22/2015   Procedure: CLIPPING OF ATRIAL APPENDAGE;  Surgeon: Kerin Perna, MD;  Location: [facility] OR;  Service: Open Heart Surgery;  Laterality: N/A;   COLONOSCOPY WITH PROPOFOL N/A 01/01/2022   Procedure: COLONOSCOPY WITH PROPOFOL;  Surgeon: Kathi Der, MD;  Location: [facility] ENDOSCOPY;  Service: Gastroenterology;  Laterality: N/A;   DOPPLER ECHOCARDIOGRAPHY  03/11/2002   EF 70-75%   FINGER SURGERY Left    Middle finger   FINGER TENDON REPAIR  1980's   "right little finger" (03/31/2012)   FLEXIBLE SIGMOIDOSCOPY N/A 12/30/2021   Procedure: FLEXIBLE SIGMOIDOSCOPY;  Surgeon: Charlott Rakes, MD;  Location: [facility] ENDOSCOPY;  Service: Gastroenterology;  Laterality: N/A;   INSERT / REPLACE / REMOVE PACEMAKER     St Jude   IR RADIOLOGIST EVAL & MGMT  05/06/2019   IR RADIOLOGIST EVAL & MGMT  10/06/2019   IR RADIOLOGIST EVAL & MGMT  10/31/2020   IR RADIOLOGIST EVAL & MGMT  01/18/2021   IR RADIOLOGIST EVAL & MGMT  03/08/2021   IR RADIOLOGIST EVAL & MGMT  06/13/2021   KIDNEY SURGERY Left    LEFT AND RIGHT HEART CATHETERIZATION WITH CORONARY ANGIOGRAM N/A 10/27/2012   Procedure: LEFT AND RIGHT HEART CATHETERIZATION WITH CORONARY ANGIOGRAM;  Surgeon: Pamella Pert, MD;  Location: [facility] CATH LAB;  Service: Cardiovascular;  Laterality: N/A;   LUMBAR DISC SURGERY  1980's X2;  2000's   MAZE N/A 08/22/2015   Procedure: MAZE;  Surgeon: Kerin Perna, MD;  Location: [facility] OR;  Service: Open Heart Surgery;  Laterality: N/A;   PACEMAKER INSERTION  03/31/2012   STJ Accent DR pacemaker implanted by Dr Johney Frame   PERMANENT  PACEMAKER INSERTION N/A 03/31/2012   Procedure: PERMANENT PACEMAKER INSERTION;  Surgeon: [name], MD;  Location: [facility] CATH LAB;  Service: Cardiovascular;  Laterality: N/A;   PPM GENERATOR CHANGEOUT N/A 08/08/2020   Procedure: PPM GENERATOR CHANGEOUT;  Surgeon: [name], MD;  Location: [facility] INVASIVE CV LAB;  Service: Cardiovascular;  Laterality: N/A;   RADIOLOGY WITH ANESTHESIA Left 02/14/2021   Procedure: RADIOLOGY WITH ANESTHESIA LEFT RENAL CRYOABLATION;  Surgeon: Irish Lack, MD;  Location: [facility] ORS;  Service: Radiology;  Laterality: Left;   STUMP REVISION Right 10/09/2022   Procedure: REVISION RIGHT BELOW KNEE AMPUTATION;  Surgeon: [name], MD;  Location: [facility] OR;  Service: Orthopedics;  Laterality: Right;   STUMP REVISION Right 11/16/2022   Procedure: REVISION RIGHT BELOW KNEE AMPUTATION;  Surgeon: [name], MD;  Location: [facility] OR;  Service: Orthopedics;  Laterality: Right;   TEE WITHOUT CARDIOVERSION  01/30/2012   Procedure: TRANSESOPHAGEAL ECHOCARDIOGRAM (TEE);  Surgeon: Laurey Morale, MD;  Location: [facility] ENDOSCOPY;  Service: Cardiovascular;  Laterality: N/A;  ablation next day   TEE WITHOUT CARDIOVERSION N/A 08/22/2015   Procedure: TRANSESOPHAGEAL ECHOCARDIOGRAM (TEE);  Surgeon: Kerin Perna, MD;  Location: [facility] OR;  Service: Open Heart Surgery;  Laterality: N/A;   TEE WITHOUT CARDIOVERSION N/A 10/19/2019   Procedure: TRANSESOPHAGEAL ECHOCARDIOGRAM (TEE);  Surgeon: Dolores Patty, MD;  Location: [facility] ENDOSCOPY;  Service: Cardiovascular;  Laterality: N/A;   US ECHOCARDIOGRAPHY  08/07/2009   EF 55-60%   Social History   Occupational History   Occupation: retired  Tobacco Use   Smoking status: Former    Current packs/day: 0.00    Average packs/day: 1 pack/day for 30.2 years (30.2 ttl pk-yrs)  Types: Cigarettes    Start date: 1963    Quit date: 07/18/1991    Years since quitting: 31.7    Passive exposure: Never   Smokeless tobacco: Never  Vaping Use   Vaping  status: Never Used  Substance and Sexual Activity   Alcohol use: Not Currently    Comment: occasional   Drug use: Yes    Types: Marijuana    Comment: Marijuana- for nausea   Sexual activity: Not Currently

## 2023-04-29 ENCOUNTER — Other Ambulatory Visit (HOSPITAL_COMMUNITY): Payer: Self-pay | Admitting: Cardiology

## 2023-05-07 ENCOUNTER — Ambulatory Visit: Payer: Medicare Other | Admitting: Pulmonary Disease

## 2023-05-07 ENCOUNTER — Encounter: Payer: Self-pay | Admitting: Pulmonary Disease

## 2023-05-07 VITALS — BP 127/65 | HR 60 | Ht 71.0 in | Wt 185.0 lb

## 2023-05-07 DIAGNOSIS — J441 Chronic obstructive pulmonary disease with (acute) exacerbation: Secondary | ICD-10-CM

## 2023-05-07 MED ORDER — IPRATROPIUM-ALBUTEROL 0.5-2.5 (3) MG/3ML IN SOLN: 3.0000 mL | Freq: Four times a day (QID) | RESPIRATORY_TRACT | 3 refills | Status: DC | PRN

## 2023-05-07 MED ORDER — ALBUTEROL SULFATE HFA 108 (90 BASE) MCG/ACT IN AERS: 2.0000 | INHALATION_SPRAY | Freq: Four times a day (QID) | RESPIRATORY_TRACT | 5 refills | Status: AC | PRN

## 2023-05-07 MED ORDER — PREDNISONE 10 MG PO TABS: 60.0000 mg | ORAL_TABLET | Freq: Every day | ORAL | 0 refills | Status: DC

## 2023-05-07 MED ORDER — BREZTRI AEROSPHERE 160-9-4.8 MCG/ACT IN AERO: 2.0000 | INHALATION_SPRAY | Freq: Two times a day (BID) | RESPIRATORY_TRACT | 3 refills | Status: AC

## 2023-05-07 MED ORDER — MONTELUKAST SODIUM 10 MG PO TABS: 10.0000 mg | ORAL_TABLET | Freq: Every day | ORAL | 11 refills | Status: DC

## 2023-05-07 MED ORDER — PREDNISONE 2.5 MG PO TABS: 2.5000 mg | ORAL_TABLET | Freq: Every day | ORAL | 3 refills | Status: DC

## 2023-05-07 NOTE — Progress Notes (Signed)
 [name]    [id number]    [birth year]  Primary Care Physician:Larnell Hamilton, MD  Referring Physician: Larnell Hamilton, MD [address] [place],  [place] [address]  Chief complaint: Acute visit for dyspnea  HPI: 73 y.o. who  has a past medical history of Aortic stenosis, mild, Arthritis of hand, Asthma, Atrial fibrillation (HCC), CHF (congestive heart failure) (HCC), Controlled type 2 diabetes mellitus without complication, without long-term current use of insulin  (HCC) (12/13/2019), COPD (chronic obstructive pulmonary disease) (HCC), DJD (degenerative joint disease), Fatty liver, Fibromyalgia, GERD (gastroesophageal reflux disease), Hyperlipidemia, Hypertension, Hypothyroidism, Left atrial enlargement, Mitral regurgitation, Obstructive sleep apnea, Pacemaker (03/31/2012), Pneumonia, Restless leg syndrome, Second degree AV block, and Splenomegaly.   Discussed the use of AI scribe software for clinical note transcription with the patient, who gave verbal consent to proceed.  Patient of Dr. Shelah, with a history of COPD, presents with worsening dyspnea and coughing over the past couple of months. He describes the cough as severe, with a sensation of 'something gets in my throat, like phlegm or something' that 'won't break loose.' He also reports episodes of choking and difficulty swallowing. The mucus is clear and copious. He denies fever but reports chills.  The patient is currently on Breztri  inhaler, Albuterol , and a low dose of Prednisone  (2.5mg  daily). He has been to the emergency room for similar symptoms in December, where he had a chest x-ray and CT scan of the abdomen, both of which I reviewed and did not show any acute lung abnormality. He received no treatment for his symptoms during that visit.  I reviewed the ED documentation.  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 He is currently in the process of  getting a prosthesis.   Outpatient Encounter Medications as of 05/07/2023  Medication Sig   acetaminophen  (TYLENOL ) 325 MG tablet Take 2 tablets (650 mg total) by mouth every 6 (six) hours as needed. (Patient taking differently: Take 650 mg by mouth every 6 (six) hours as needed for mild pain (pain score 1-3) or moderate pain (pain score 4-6).)   amLODipine  (NORVASC ) 5 MG tablet Take 2.5 mg by mouth in the morning.   apixaban  (ELIQUIS ) 5 MG TABS tablet Take 1 tablet (5 mg total) by mouth 2 (two) times daily.   bisoprolol  (ZEBETA ) 5 MG tablet Take 1 tablet (5 mg total) by mouth daily. Please call office to arrange follow up appointment   Cholecalciferol  (VITAMIN D ) 50 MCG (2000 UT) tablet Take 2 tablets (4,000 Units total) by mouth daily. (Patient taking differently: Take 8,000 Units by mouth at bedtime.)   empagliflozin  (JARDIANCE ) 10 MG TABS tablet Take 1 tablet (10 mg total) by mouth daily. (Patient taking differently: Take 10 mg by mouth at bedtime.)   gabapentin  (NEURONTIN ) 300 MG capsule Take 2 capsules (600 mg total) by mouth 3 (three) times daily. for neuropathy (Patient taking differently: Take 600 mg by mouth 2 (two) times daily. for neuropathy)   levothyroxine  (SYNTHROID , LEVOTHROID) 175 MCG tablet Take 175 mcg by mouth daily before breakfast.   Menthol -Camphor (TIGER BALM ARTHRITIS RUB EX) Apply 1 application topically as needed (for leg pain).   Multiple Minerals-Vitamins (CAL-MAG-ZINC -D) TABS Take 1 tablet by mouth at bedtime.   Multiple Vitamin (MULTIVITAMIN WITH MINERALS) TABS tablet Take 1 tablet by mouth daily.   ondansetron  (ZOFRAN ) 4 MG tablet Take 1 tablet (4 mg total) by mouth every 6 (  six) hours. (Patient taking differently: Take 4 mg by mouth daily as needed for nausea or vomiting.)   oxyCODONE -acetaminophen  (PERCOCET/ROXICET) 5-325 MG tablet Take 1 tablet by mouth every 8 (eight) hours as needed for severe pain. (Patient taking differently: Take 2 tablets by mouth daily.)    OXYGEN  Inhale 2 L/min into the lungs continuous.   pantoprazole  (PROTONIX ) 40 MG tablet Take 40 mg by mouth daily before breakfast.   Polyethyl Glyc-Propyl Glyc PF (SYSTANE ULTRA PF) 0.4-0.3 % SOLN Place 2 drops into both eyes daily as needed (dry eyes).   polyethylene glycol (MIRALAX  / GLYCOLAX ) 17 g packet Take 17 g by mouth 2 (two) times daily as needed. (Patient taking differently: Take 17 g by mouth 3 (three) times a week.)   potassium chloride  SA (KLOR-CON  M) 20 MEQ tablet Take 20 mEq by mouth See admin instructions. Take 20 meq twice daily every other day   predniSONE  (DELTASONE ) 10 MG tablet Take 6 tablets (60 mg total) by mouth daily with breakfast. Take 60 mg/day on day 1 and reduce dose by 10 mg every 2 days until taper is complete   rOPINIRole  (REQUIP ) 1 MG tablet Take 1 tablet (1 mg total) by mouth in the morning and at bedtime. (Patient taking differently: Take 1 mg by mouth in the morning.)   rosuvastatin  (CRESTOR ) 10 MG tablet Take 10 mg by mouth at bedtime.    senna-docusate (SENOKOT-S) 8.6-50 MG tablet Take 1 tablet by mouth 2 (two) times daily.   spironolactone  (ALDACTONE ) 25 MG tablet Take 1 tablet (25 mg total) by mouth daily.   torsemide  (DEMADEX ) 10 MG tablet Take 10 mg by mouth every other day.   torsemide  (DEMADEX ) 20 MG tablet Take 20 mg by mouth every other day.   [DISCONTINUED] albuterol  (PROVENTIL  HFA;VENTOLIN  HFA) 108 (90 Base) MCG/ACT inhaler Inhale 2 puffs into the lungs every 6 (six) hours as needed for wheezing or shortness of breath.   [DISCONTINUED] Budeson-Glycopyrrol-Formoterol  (BREZTRI  AEROSPHERE) 160-9-4.8 MCG/ACT AERO Inhale 2 puffs into the lungs in the morning and at bedtime.   [DISCONTINUED] montelukast  (SINGULAIR ) 10 MG tablet TAKE 1 TABLET(10 MG) BY MOUTH AT BEDTIME (Patient taking differently: Take 10 mg by mouth at bedtime.)   [DISCONTINUED] predniSONE  (DELTASONE ) 2.5 MG tablet TAKE 1 TABLET(2.5 MG) BY MOUTH DAILY WITH BREAKFAST (Patient taking  differently: Take 2.5 mg by mouth daily with breakfast.)   albuterol  (VENTOLIN  HFA) 108 (90 Base) MCG/ACT inhaler Inhale 2 puffs into the lungs every 6 (six) hours as needed for wheezing or shortness of breath.   Budeson-Glycopyrrol-Formoterol  (BREZTRI  AEROSPHERE) 160-9-4.8 MCG/ACT AERO Inhale 2 puffs into the lungs in the morning and at bedtime.   hydrOXYzine  (ATARAX ) 10 MG tablet TAKE 1 TABLET(10 MG) BY MOUTH THREE TIMES DAILY AS NEEDED (Patient not taking: No sig reported)   montelukast  (SINGULAIR ) 10 MG tablet Take 1 tablet (10 mg total) by mouth at bedtime.   predniSONE  (DELTASONE ) 2.5 MG tablet Take 1 tablet (2.5 mg total) by mouth daily with breakfast.   No facility-administered encounter medications on file as of 05/07/2023.   Physical Exam: Blood pressure 127/65, pulse 60, height 5' 11 (1.803 m), weight 185 lb (83.9 kg), SpO2 94%. Gen:      No acute distress HEENT:  EOMI, sclera anicteric Neck:     No masses; no thyromegaly Lungs:    Clear to auscultation bilaterally; normal respiratory effort CV:         Regular rate and rhythm; no murmurs Abd:      +  bowel sounds; soft, non-tender; no palpable masses, no distension Ext:    No edema; adequate peripheral perfusion Skin:      Warm and dry; no rash Neuro: alert and oriented x 3 Psych: normal mood and affect  Data Reviewed: Imaging: CT abdomen pelvis 04/15/2023-visualized lungs show calcified granuloma lung fields are clear.   Chest x-ray 04/15/2023-no active cardiopulmonary disease. I reviewed the images personally.  Assessment and Plan COPD exacerbation Increased cough, mucus production, and dyspnea for the past couple of months. No fever or purulent sputum. Recent ER visit with normal chest X-ray and CT scan. Currently on Breztri  inhaler, Albuterol , and Prednisone  2.5mg  daily.  -Increase Prednisone  dose temporarily and taper down to regular dose once symptoms improve. -Order new medication for nebulizer. -Check back if  symptoms do not improve.  Oxygen  requirement Currently on 2L oxygen . Expressed interest in portable oxygen  concentrator. Unable to perform walk test due to recent lower extremity amputation. -Discuss oxygen  requirement and potential assessment after current COPD exacerbation resolves.  Follow-up in 3-4 months.   Lonna Coder MD [facility] 05/07/2023, 11:39 AM  CC: Larnell Hamilton, MD

## 2023-05-07 NOTE — Patient Instructions (Signed)
 VISIT SUMMARY:  During today's visit, we discussed your worsening breathing difficulties and coughing over the past couple of months. We reviewed your current medications and recent emergency room visit, and we also talked about your recent lower limb amputation and your interest in a portable oxygen  concentrator.  YOUR PLAN:  -COPD EXACERBATION: COPD exacerbation means a worsening of your chronic obstructive pulmonary disease symptoms, such as increased coughing, mucus production, and difficulty breathing. We will temporarily increase your Prednisone  dose and taper it down once your symptoms improve. Additionally, we will prescribe a new medication for your nebulizer. Please check back if your symptoms do not improve.  -OXYGEN  REQUIREMENT: You are currently using 2 liters of oxygen  and have expressed interest in a portable oxygen  concentrator. We will discuss your oxygen  needs and potential assessment after your current COPD exacerbation resolves.  INSTRUCTIONS:  Please follow up in 3-4 months for a re-evaluation of your condition and to discuss your oxygen  requirements further.

## 2023-05-08 ENCOUNTER — Ambulatory Visit (INDEPENDENT_AMBULATORY_CARE_PROVIDER_SITE_OTHER): Payer: Medicare Other

## 2023-05-08 DIAGNOSIS — I442 Atrioventricular block, complete: Secondary | ICD-10-CM | POA: Diagnosis not present

## 2023-05-08 LAB — CUP PACEART REMOTE DEVICE CHECK
Battery Remaining Longevity: 105 mo
Battery Remaining Percentage: 79 %
Battery Voltage: 3.02 V
Brady Statistic RV Percent Paced: 99 %
Date Time Interrogation Session: 20250109063526
Implantable Lead Connection Status: 753985
Implantable Lead Connection Status: 753985
Implantable Lead Implant Date: 20131203
Implantable Lead Implant Date: 20131203
Implantable Lead Location: 753859
Implantable Lead Location: 753860
Implantable Lead Model: 1948
Implantable Pulse Generator Implant Date: 20220412
Lead Channel Impedance Value: 580 Ohm
Lead Channel Pacing Threshold Amplitude: 1.125 V
Lead Channel Pacing Threshold Pulse Width: 0.5 ms
Lead Channel Sensing Intrinsic Amplitude: 7.2 mV
Lead Channel Setting Pacing Amplitude: 1.375
Lead Channel Setting Pacing Pulse Width: 0.5 ms
Lead Channel Setting Sensing Sensitivity: 4 mV
Pulse Gen Model: 2272
Pulse Gen Serial Number: 3919039

## 2023-05-09 ENCOUNTER — Telehealth (HOSPITAL_COMMUNITY): Payer: Self-pay | Admitting: Pharmacy Technician

## 2023-05-09 NOTE — Telephone Encounter (Signed)
 Advanced Heart Failure Patient Advocate Encounter  Patients wife called and asked for help with Jardiance. Called and spoke with the pharmacy. They forgot to use the grant information. Called and updated patients wife.  Archer Asa, CPhT

## 2023-05-16 ENCOUNTER — Ambulatory Visit (HOSPITAL_COMMUNITY)
Admission: RE | Admit: 2023-05-16 | Discharge: 2023-05-16 | Disposition: A | Discharge status: Home / Self Care | Payer: Medicare Other | Source: Ambulatory Visit | Attending: Cardiology | Admitting: Cardiology

## 2023-05-16 ENCOUNTER — Encounter (HOSPITAL_COMMUNITY): Payer: Self-pay | Admitting: Cardiology

## 2023-05-16 VITALS — BP 138/68 | HR 62

## 2023-05-16 DIAGNOSIS — Z79899 Other long term (current) drug therapy: Secondary | ICD-10-CM | POA: Diagnosis not present

## 2023-05-16 DIAGNOSIS — E119 Type 2 diabetes mellitus without complications: Secondary | ICD-10-CM | POA: Diagnosis not present

## 2023-05-16 DIAGNOSIS — J441 Chronic obstructive pulmonary disease with (acute) exacerbation: Secondary | ICD-10-CM | POA: Diagnosis not present

## 2023-05-16 DIAGNOSIS — I2510 Atherosclerotic heart disease of native coronary artery without angina pectoris: Secondary | ICD-10-CM | POA: Diagnosis not present

## 2023-05-16 DIAGNOSIS — N1832 Chronic kidney disease, stage 3b: Secondary | ICD-10-CM | POA: Insufficient documentation

## 2023-05-16 DIAGNOSIS — Z950 Presence of cardiac pacemaker: Secondary | ICD-10-CM | POA: Insufficient documentation

## 2023-05-16 DIAGNOSIS — I4819 Other persistent atrial fibrillation: Secondary | ICD-10-CM | POA: Insufficient documentation

## 2023-05-16 DIAGNOSIS — I350 Nonrheumatic aortic (valve) stenosis: Secondary | ICD-10-CM | POA: Diagnosis not present

## 2023-05-16 DIAGNOSIS — I4821 Permanent atrial fibrillation: Secondary | ICD-10-CM

## 2023-05-16 DIAGNOSIS — I110 Hypertensive heart disease with heart failure: Secondary | ICD-10-CM | POA: Diagnosis present

## 2023-05-16 DIAGNOSIS — Z89511 Acquired absence of right leg below knee: Secondary | ICD-10-CM

## 2023-05-16 DIAGNOSIS — R0603 Acute respiratory distress: Secondary | ICD-10-CM | POA: Diagnosis not present

## 2023-05-16 DIAGNOSIS — Z7901 Long term (current) use of anticoagulants: Secondary | ICD-10-CM | POA: Insufficient documentation

## 2023-05-16 DIAGNOSIS — I5032 Chronic diastolic (congestive) heart failure: Secondary | ICD-10-CM | POA: Insufficient documentation

## 2023-05-16 DIAGNOSIS — Z9889 Other specified postprocedural states: Secondary | ICD-10-CM | POA: Insufficient documentation

## 2023-05-16 DIAGNOSIS — Z953 Presence of xenogenic heart valve: Secondary | ICD-10-CM | POA: Diagnosis not present

## 2023-05-16 DIAGNOSIS — I10 Essential (primary) hypertension: Secondary | ICD-10-CM | POA: Diagnosis not present

## 2023-05-16 DIAGNOSIS — I130 Hypertensive heart and chronic kidney disease with heart failure and stage 1 through stage 4 chronic kidney disease, or unspecified chronic kidney disease: Secondary | ICD-10-CM | POA: Diagnosis not present

## 2023-05-16 DIAGNOSIS — E1122 Type 2 diabetes mellitus with diabetic chronic kidney disease: Secondary | ICD-10-CM | POA: Insufficient documentation

## 2023-05-16 DIAGNOSIS — J9611 Chronic respiratory failure with hypoxia: Secondary | ICD-10-CM | POA: Diagnosis not present

## 2023-05-16 DIAGNOSIS — Z7984 Long term (current) use of oral hypoglycemic drugs: Secondary | ICD-10-CM | POA: Diagnosis not present

## 2023-05-16 DIAGNOSIS — I272 Pulmonary hypertension, unspecified: Secondary | ICD-10-CM | POA: Diagnosis not present

## 2023-05-16 MED ORDER — TORSEMIDE 20 MG PO TABS: 40.0000 mg | ORAL_TABLET | Freq: Every day | ORAL | 8 refills | Status: DC

## 2023-05-16 NOTE — Progress Notes (Signed)
ADVANCED HEART FAILURE FOLLOW UP CLINIC NOTE  Referring Physician: Alysia Penna, MD  Primary Care: Victor Penna, MD Primary Cardiologist:Dr. Gala [name]   HPI: [name] is a 73 y.o. 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      Admitte 02/02/16 with severe DOE with any exercise or walking 10-20 feet. Pt admitted and planned for [facility] 02/05/16. Diuresed with IV lasix with rapid improvement of symptoms. Overall diuresed 15.2 L and down 14 lbs. Discharge weight was 205 lbs. L/RHC 02/05/16 with no significant CAD. RHC showed significant difference between PCWP (50) and LVEDP (30) suggestive of MS but no MS on Echo. Weight at d/c was 205 pounds.   Echo 02/02/16 LVEF 60-65%, Grade 3 DD. Mild AI, Mild MR, RV mildly dilated and severely reduced. RA mildly dilated. PA peak pressure 74 mm Hg. Concerns for restrictive cardiomyopathy.   Admitted 12/17 for Tikosyn load. RHC completed during that admission as below.   Admitted 6/21 with recurrent respiratory failure and lactic acidosis felt to be mostly COPD flare. Had recurrent AF and underwent TEE-DC-CV on 10/19/19. Tikosyn continued.  Admitted 9/21 with SOB/chills, leukocytosis (WBC 15.9k), concerning for COVID infection versus COPD exacerbation. Diuresed with IV lasix on presentation, CXR negative for pulmonary edema or infiltrates. No antibiotics administered, continuous 2L of oxygen, prednisone 5 mg daily & inhalers daily for mild COPD exacerbation. Abdominal pain improved; CT abdomen unremarkable.    02/25/20 DCCV with Dr. Tenny [name] maintained NSR only for 5 hrs   Underwent repeat AF ablation 04/19/20. Returned to AF 04/21/20.    Repeat DC-CV 05/25/20 -> AF   Repeat DC-CV 07/11/19. PPM at E[facility]  Echo 07/12/20: EF 55-60% RV ok. Mild MR/TR. Bioprosthetic AVR  ok. Restrictive physiology. Back in AFL 3/22, Tikosyn stopped by EP and rate control strategy pursued.    PPM generator change 4/22.   Admitted 2/23 with CP. Echo showed EF 55-60%, RV normal. Cards consulted and felt CP was atypical and no further work up.   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    SUBJECTIVE:  Patient reports significant distress today.  He is having ongoing leg pain that is not controlled by his current dose of opiates, has follow-up with his prescriber in 5 days.  He also reports ongoing respiratory distress including cough, orthopnea, wheezing, swelling.  They have increased their torsemide to 40 mg every other day which has helped his swelling somewhat, but his breathing remains labored.  Was recently placed on prednisone by his pulmonologist.  PMH, current medications, allergies, social history, and family history reviewed in epic.  PHYSICAL EXAM: Vitals:   05/16/23 1155  BP: 138/68  Pulse: 62  SpO2: 92%   GENERAL: Frail, chronically ill-appearing HEENT: The mucous membranes are pink and moist.   PULM: Mildly tachypneic, significant end expiratory wheezing CARDIAC:  JVP: Moderately elevated         Normal rate with regular rhythm.  Systolic murmur, 1+ edema, right AKA ABDOMEN: Soft, non-tender, non-distended. NEUROLOGIC: Patient is oriented x3 with no focal or lateralizing neurologic deficits.  PSYCH: Patients affect is appropriate, there is no evidence of anxiety or depression.   ASSESSMENT & PLAN:  1. Chronic diastolic CHF with RV failure -  Echo (10/17): EF 60-65%, Grade 3 DD. Mild AI, Mild MR, RV mildly dilated and severely reduced. RA mildly dilated. PA peak pressure 74 mm Hg. Concerns for restrictive cardiomyopathy. - Echo (7/19): EF 60-65% AVR ok  - TEE (6/21): EF 60% RV moderately HK septal flattening RVSP 90s - Echo (3/22): EF 55-60% RV ok. Mild MR/TR. Bioprosthetic  AVR ok. Restrictive physiology. - Echo (2/23): EF 55-60%, normal RV, bioprosthetic AVR ok -Volume overloaded today, COPD exacerbation as well - Continue Jardiance 10 mg daily. -Increase torsemide to 40 mg daily with 1 month follow-up in app clinic - Continue spironolactone 25 mg daily.   - Continue bisoprolol 5 mg daily. - Reinforced fluid restriction to < 2 L daily, sodium restriction to less than 2000 mg daily, and the importance of daily weights.   -Labs at next visit, recent creatinine 2 likely in the setting of cardiorenal syndrome   2. Persistent Afib s/p AF ablation x 2/Maze s/p pacemaker - TEE-DC-CV on 10/19/19 & DCCV on 02/25/20 - Repeat AF ablation 04/19/20. Returned to AF 04/21/20.  - DC-CV 05/25/20 -> AF - DC-CV 07/11/19. Back in AFL 3/22.  - Off Tikosyn. Per EP, not a candidate for further anti-arrhythmics or procedures. Now rate control strategy - Continue Eliquis 5 mg bid.   3. AS s/p AVR - Stable on echo 3/22, AV mean gradient 7 mmHg - aware of need for SBE prophylaxis - Follow. Echo (2/23),mean gradient 11.0 mmHg     4 Pulmonary HTN - suspect pulmonary venous hypertension - RHC in 10/17 with pulmonary venous HTN primarily. No change. - TEE (6/21) EF 60% RV moderately HK septal flattening RVSP 90s in setting of COPD flare and PAF. - Echo (3/22) RV looks better. TR insufficient to measure RVSP - Echo (2/23): EF 55-60%, RV ok, RVSP 32.4 mmHg, AV mean gradient 11.0 mmHg, AV peak gradient 20.8 mmHg, VTI 1.63 cm   5. Mild CAD by cath 01/2016 - Medical management.  - Continue Crestor 10 mg daily - No s/s of ischemia   6. Chronic respiratory failure with hypoxia - Continue with 2L home O2 - Follows with Pulmonary.  -Currently on prednisone for a COPD exacerbation, significant wheezing on exam   7. HTN -Mildly elevated today, better control at home - Continue current regimen.   8. DM2 - Contiue Jardiance.  - Last Hgb A1c 6.6 (2/23)   9. Valvular disease - severe  TR mod MR on TEE 6/21 - mild on echo 3/22 - trivial on echo 2/23   10. 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 Kathrene [name] - last SCr 2.07    Victor Hasten, MD Advanced Heart Failure Mechanical Circulatory Support 05/16/23

## 2023-05-16 NOTE — Patient Instructions (Signed)
Good to see you today!  INCREASE torsemide to 40 mg daily  Your physician has requested that you have an echocardiogram. Echocardiography is a painless test that uses sound waves to create images of your heart. It provides your doctor with information about the size and shape of your heart and how well your heart's chambers and valves are working. This procedure takes approximately one hour. There are no restrictions for this procedure. Please do NOT wear cologne, perfume, aftershave, or lotions (deodorant is allowed). Please arrive 15 minutes prior to your appointment time.  Please note: We ask at that you not bring children with you during ultrasound (echo/ vascular) testing. Due to room size and safety concerns, children are not allowed in the ultrasound rooms during exams. Our front office staff cannot provide observation of children in our lobby area while testing is being conducted. An adult accompanying a patient to their appointment will only be allowed in the ultrasound room at the discretion of the ultrasound technician under special circumstances. We apologize for any inconvenience.  Your physician recommends that you schedule a follow-up appointment in: as scheduled  If you have any questions or concerns before your next appointment please send [place] a message through [place] or call our office at [phone].    TO LEAVE A MESSAGE FOR THE NURSE SELECT OPTION 2, PLEASE LEAVE A MESSAGE INCLUDING: YOUR NAME DATE OF BIRTH CALL BACK NUMBER REASON FOR CALL**this is important as we prioritize the call backs  YOU WILL RECEIVE A CALL BACK THE SAME DAY AS LONG AS YOU CALL BEFORE 4:00 PM  At the [facility], you and your health needs are our priority. As part of our continuing mission to provide you with exceptional heart care, we have created designated Provider Care Teams. These Care Teams include your primary Cardiologist (physician) and Advanced Practice Providers (APPs-  Physician Assistants and Nurse Practitioners) who all work together to provide you with the care you need, when you need it.   You may see any of the following providers on your designated Care Team at your next follow up: Dr Arvilla Meres Dr Marca Ancona Dr. Dorthula Nettles Dr. [name] Amy Filbert Schilder, NP Robbie Lis, [place] [facility] [place], [place] Brynda Peon, NP [place] Lee, NP Karle Plumber, PharmD   Please be sure to bring in all your medications bottles to every appointment.    Thank you for choosing [facility]

## 2023-05-16 NOTE — Progress Notes (Signed)
Medication Samples have been provided to the patient.  Drug name: Eliquis       Strength: 5 mg        Qty: 4 boxes  LOT: [id number]  Exp.Date: 01/26  Dosing instructions: take 1 tablet Twice daily   The patient has been instructed regarding the correct time, dose, and frequency of taking this medication, including desired effects and most common side effects.   [name] [name] 12:24 PM 05/16/2023

## 2023-05-19 ENCOUNTER — Encounter: Payer: Medicare Other | Admitting: Physical Therapy

## 2023-05-21 ENCOUNTER — Telehealth: Payer: Self-pay | Admitting: Pulmonary Disease

## 2023-05-21 ENCOUNTER — Telehealth: Payer: Medicare Other | Admitting: Pulmonary Disease

## 2023-05-21 ENCOUNTER — Telehealth: Payer: Self-pay

## 2023-05-21 ENCOUNTER — Telehealth (HOSPITAL_COMMUNITY): Payer: Self-pay | Admitting: Family Medicine

## 2023-05-21 DIAGNOSIS — J441 Chronic obstructive pulmonary disease with (acute) exacerbation: Secondary | ICD-10-CM | POA: Diagnosis not present

## 2023-05-21 MED ORDER — ZITHROMAX Z-PAK 250 MG PO TABS: ORAL_TABLET | ORAL | 0 refills | Status: DC

## 2023-05-21 MED ORDER — AZITHROMYCIN 250 MG PO TABS: 250.0000 mg | ORAL_TABLET | Freq: Every day | ORAL | 0 refills | Status: AC

## 2023-05-21 MED ORDER — PREDNISONE 10 MG PO TABS: 60.0000 mg | ORAL_TABLET | Freq: Every day | ORAL | 0 refills | Status: DC

## 2023-05-21 NOTE — Progress Notes (Signed)
[name]    [id number]    [birth year]  Primary Care Physician:Alysia Penna, MD  Referring Physician: Alysia Penna, MD [address] [place],  [place] [address]  Virtual Visit via Video Note  I connected with [name] on 05/21/23 at  2:45 PM EST by a video enabled telemedicine application and verified that I am speaking with the correct person using two identifiers.  Location: Patient: Home Provider: Pulmonary Office   I discussed the limitations of evaluation and management by telemedicine and the availability of in person appointments. The patient expressed understanding and agreed to proceed.  Chief complaint: Acute visit for dyspnea  HPI: 72 y.o. who  has a past medical history of Aortic stenosis, mild, Arthritis of hand, Asthma, Atrial fibrillation (HCC), CHF (congestive heart failure) (HCC), Controlled type 2 diabetes mellitus without complication, without long-term current use of insulin (HCC) (12/13/2019), COPD (chronic obstructive pulmonary disease) (HCC), DJD (degenerative joint disease), Fatty liver, Fibromyalgia, GERD (gastroesophageal reflux disease), Hyperlipidemia, Hypertension, Hypothyroidism, Left atrial enlargement, Mitral regurgitation, Obstructive sleep apnea, Pacemaker (03/31/2012), Pneumonia, Restless leg syndrome, Second degree AV block, and Splenomegaly.   Discussed the use of AI scribe software for clinical note transcription with the patient, who gave verbal consent to proceed.  Patient of Dr. Delton Coombes, with a history of COPD, presents with worsening dyspnea and coughing over the past couple of months. He describes the cough as severe, with a sensation of 'something gets in my throat, like phlegm or something' that 'won't break loose.' He also reports episodes of choking and difficulty swallowing. The mucus is clear and copious. He denies fever but reports chills.  The patient is currently on Breztri inhaler, Albuterol, and a low dose  of Prednisone (2.5mg  daily). He has been to the emergency room for similar symptoms in December, where he had a chest x-ray and CT scan of the abdomen, both of which I reviewed and did not show any acute lung abnormality. He received no treatment for his symptoms during that visit.  I reviewed the ED documentation.  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 He is currently in the process of getting a prosthesis.   Interim history: The patient, with a history of COPD, presents with persistent symptoms despite a recent course of prednisone for an exacerbation. He reports no improvement with the higher dose of prednisone and continues to experience difficulty speaking, hoarseness, and coughing. He also notes a change in the color of his sputum to a light beige. He denies receiving antibiotics during his last visit, which was due to a normal chest x-ray.  Outpatient Encounter Medications as of 05/21/2023  Medication Sig   acetaminophen (TYLENOL) 325 MG tablet Take 2 tablets (650 mg total) by mouth every 6 (six) hours as needed.   albuterol (VENTOLIN HFA) 108 (90 Base) MCG/ACT inhaler Inhale 2 puffs into the lungs every 6 (six) hours as needed for wheezing or shortness of breath.   amLODipine (NORVASC) 5 MG tablet Take 2.5 mg by mouth in the morning.   apixaban (ELIQUIS) 5 MG TABS tablet Take 1 tablet (5 mg total) by mouth 2 (two) times daily.   bisoprolol (ZEBETA) 5 MG tablet Take 1 tablet (5 mg total) by mouth daily. Please call office to arrange follow up appointment   Budeson-Glycopyrrol-Formoterol (BREZTRI AEROSPHERE) 160-9-4.8 MCG/ACT AERO Inhale 2 puffs into the lungs in the morning and at bedtime.  Cholecalciferol (VITAMIN D) 50 MCG (2000 UT) tablet Take 2 tablets (4,000 Units total) by mouth daily.   empagliflozin (JARDIANCE) 10 MG TABS tablet Take 1 tablet (10 mg total) by mouth daily.   gabapentin (NEURONTIN) 300 MG capsule Take 2  capsules (600 mg total) by mouth 3 (three) times daily. for neuropathy   hydrOXYzine (ATARAX) 10 MG tablet TAKE 1 TABLET(10 MG) BY MOUTH THREE TIMES DAILY AS NEEDED (Patient not taking: No sig reported)   ipratropium-albuterol (DUONEB) 0.5-2.5 (3) MG/3ML SOLN Take 3 mLs by nebulization every 6 (six) hours as needed.   levothyroxine (SYNTHROID, LEVOTHROID) 175 MCG tablet Take 175 mcg by mouth daily before breakfast.   Menthol-Camphor (TIGER BALM ARTHRITIS RUB EX) Apply 1 application topically as needed (for leg pain).   montelukast (SINGULAIR) 10 MG tablet Take 1 tablet (10 mg total) by mouth at bedtime.   Multiple Minerals-Vitamins (CAL-MAG-ZINC-D) TABS Take 1 tablet by mouth at bedtime.   Multiple Vitamin (MULTIVITAMIN WITH MINERALS) TABS tablet Take 1 tablet by mouth daily.   ondansetron (ZOFRAN) 4 MG tablet Take 1 tablet (4 mg total) by mouth every 6 (six) hours.   oxyCODONE-acetaminophen (PERCOCET/ROXICET) 5-325 MG tablet Take 1 tablet by mouth every 8 (eight) hours as needed for severe pain.   OXYGEN Inhale 2 L/min into the lungs continuous.   pantoprazole (PROTONIX) 40 MG tablet Take 40 mg by mouth daily before breakfast.   Polyethyl Glyc-Propyl Glyc PF (SYSTANE ULTRA PF) 0.4-0.3 % SOLN Place 2 drops into both eyes daily as needed (dry eyes).   polyethylene glycol (MIRALAX / GLYCOLAX) 17 g packet Take 17 g by mouth 2 (two) times daily as needed.   potassium chloride SA (KLOR-CON M) 20 MEQ tablet Take 20 mEq by mouth See admin instructions. Take 20 meq twice daily every other day   predniSONE (DELTASONE) 10 MG tablet Take 6 tablets (60 mg total) by mouth daily with breakfast. Take 60 mg/day on day 1 and reduce dose by 10 mg every 2 days until taper is complete   predniSONE (DELTASONE) 2.5 MG tablet Take 1 tablet (2.5 mg total) by mouth daily with breakfast. (Patient not taking: Reported on 05/16/2023)   rOPINIRole (REQUIP) 1 MG tablet Take 1 tablet (1 mg total) by mouth in the morning and at  bedtime.   rosuvastatin (CRESTOR) 10 MG tablet Take 10 mg by mouth at bedtime.    senna-docusate (SENOKOT-S) 8.6-50 MG tablet Take 1 tablet by mouth 2 (two) times daily.   spironolactone (ALDACTONE) 25 MG tablet Take 1 tablet (25 mg total) by mouth daily.   torsemide (DEMADEX) 20 MG tablet Take 2 tablets (40 mg total) by mouth daily.   No facility-administered encounter medications on file as of 05/21/2023.   Physical Exam: Tele  Data Reviewed: Imaging: CT abdomen pelvis 04/15/2023-visualized lungs show calcified granuloma lung fields are clear.   Chest x-ray 04/15/2023-no active cardiopulmonary disease. I reviewed the images personally.  Assessment and Plan COPD Exacerbation Persistent symptoms despite a course of high-dose prednisone. Change in sputum color noted. No improvement with increased prednisone dose.  - Start Azithromycin (Z-Pak) for possible bacterial infection. - Continue Prednisone, started 60 mg/day.  Reduce dose by 10 mg every 2 days until at baseline dose of 2.5 mg/day If no improvement in spite of multiple rounds of prednisone then he may need admission for IV antibiotics.   I discussed the assessment and treatment plan with the patient. The patient was provided an opportunity to ask questions and all  were answered. The patient agreed with the plan and demonstrated an understanding of the instructions.   The patient was advised to call back or seek an in-person evaluation if the symptoms worsen or if the condition fails to improve as anticipated.  Chilton Greathouse MD [facility] 05/21/2023, 2:37 PM  CC: Alysia Penna, MD

## 2023-05-21 NOTE — Telephone Encounter (Signed)
Pt appt with Dr.Mannam needs to be canceled pt is Dr.Byrum pt and needs to f/u with Byrum

## 2023-05-21 NOTE — Telephone Encounter (Signed)
Ok. Done. Generic Rx sent in. Please inform the patient

## 2023-05-21 NOTE — Telephone Encounter (Signed)
Spoke with the pt's spouse  Pt with continued cough and wheezing since last visit  Virtual appt scheduled so he can discuss symptoms with a provider Nothing further needed

## 2023-05-21 NOTE — Patient Instructions (Signed)
VISIT SUMMARY:  You were seen today for persistent symptoms related to your COPD, including difficulty speaking, hoarseness, and coughing. Despite a recent course of prednisone, your symptoms have not improved, and you have noticed a change in the color of your sputum.  YOUR PLAN:  -COPD EXACERBATION: COPD exacerbation means a worsening of your chronic obstructive pulmonary disease symptoms. Since your symptoms have not improved with prednisone alone, we are starting you on Azithromycin (Z-Pak) to treat a possible bacterial infection. Please continue taking your prednisone as previously directed.  INSTRUCTIONS:  Please follow up as needed if your symptoms do not improve or if they worsen. Continue taking your medications as prescribed.

## 2023-05-21 NOTE — Telephone Encounter (Signed)
Wife (DPR) calling. States the [facility] needs a generic alternative to the Antibx. Please resubmit of possible. RX is marked "Fill as written". Sent note to Dr. Isaiah Serge

## 2023-05-21 NOTE — Telephone Encounter (Signed)
Called and spoke with pt and wife nfn

## 2023-05-21 NOTE — Telephone Encounter (Signed)
PT's wife (DPR) calling. States the round of Pred did not help and asked for appt. Dcln open accute appts (All) so I said I'd let Triage know.

## 2023-05-30 ENCOUNTER — Other Ambulatory Visit (HOSPITAL_COMMUNITY): Payer: Self-pay | Admitting: Cardiology

## 2023-05-30 MED ORDER — AMLODIPINE BESYLATE 5 MG PO TABS: 2.5000 mg | ORAL_TABLET | Freq: Every morning | ORAL | 11 refills | Status: DC

## 2023-06-03 ENCOUNTER — Ambulatory Visit: Payer: Medicare Other | Admitting: Physical Therapy

## 2023-06-03 ENCOUNTER — Encounter: Payer: Self-pay | Admitting: Physical Therapy

## 2023-06-03 ENCOUNTER — Other Ambulatory Visit: Payer: Self-pay

## 2023-06-03 DIAGNOSIS — R2681 Unsteadiness on feet: Secondary | ICD-10-CM | POA: Diagnosis not present

## 2023-06-03 DIAGNOSIS — M79605 Pain in left leg: Secondary | ICD-10-CM | POA: Diagnosis not present

## 2023-06-03 DIAGNOSIS — M79604 Pain in right leg: Secondary | ICD-10-CM | POA: Diagnosis not present

## 2023-06-03 DIAGNOSIS — M6281 Muscle weakness (generalized): Secondary | ICD-10-CM

## 2023-06-03 DIAGNOSIS — Z9181 History of falling: Secondary | ICD-10-CM

## 2023-06-03 DIAGNOSIS — Z7409 Other reduced mobility: Secondary | ICD-10-CM

## 2023-06-03 DIAGNOSIS — R2689 Other abnormalities of gait and mobility: Secondary | ICD-10-CM

## 2023-06-03 NOTE — Therapy (Signed)
 OUTPATIENT PHYSICAL THERAPY PROSTHETIC EVALUATION   Patient Name: [name] MRN: [id number] DOB:1951/04/28, 73 y.o., male Today's Date: 06/03/2023  END OF SESSION:  PT End of Session - 06/03/23 1056     Visit Number 1    Number of Visits 25    Date for PT Re-Evaluation 08/28/23    Authorization Type [employer] Medicare    Progress Note Due on Visit 10    PT Start Time 1015    PT Stop Time 1058    PT Time Calculation (min) 43 min    Equipment Utilized During Treatment Gait belt    Activity Tolerance Patient tolerated treatment well;Patient limited by pain    Behavior During Therapy WFL for tasks assessed/performed             Past Medical History:  Diagnosis Date   Aortic stenosis, mild    Arthritis of hand    just a little bit in both hands (03/31/2012)   Asthma    little bit (03/31/2012)   Atrial fibrillation (HCC)    dx '04; DCCV '04, placed on flecainide, failed DCCV 04/2010, flecainide stopped; s/p successful A.fib ablation 01/31/12   CHF (congestive heart failure) (HCC)    Controlled type 2 diabetes mellitus without complication, without long-term current use of insulin  (HCC) 12/13/2019   COPD (chronic obstructive pulmonary disease) (HCC)    DJD (degenerative joint disease)    Fatty liver    Fibromyalgia    GERD (gastroesophageal reflux disease)    Hyperlipidemia    Hypertension    Hypothyroidism    S/P radiation   Left atrial enlargement    LA size 45mm by echo 11/21/11   Mitral regurgitation    trivial   Obstructive sleep apnea    mild by sleep study 2013; pt stated he does not have a machine because it wasn't bad enough for him to have one   Pacemaker 03/31/2012   Pneumonia    last time 2023   Restless leg syndrome    Second degree AV block    Splenomegaly    Past Surgical History:  Procedure Laterality Date   AMPUTATION Right 08/23/2022   Procedure: RIGHT FOOT FIFTH RAY AMPUTATION;  Surgeon: Harden Jerona GAILS, MD;  Location: [facility] OR;  Service:  Orthopedics;  Laterality: Right;   AMPUTATION Right 09/04/2022   Procedure: RIGHT BELOW KNEE AMPUTATION;  Surgeon: Harden Jerona GAILS, MD;  Location: [facility] OR;  Service: Orthopedics;  Laterality: Right;   AORTIC VALVE REPLACEMENT N/A 08/22/2015   Procedure: AORTIC VALVE REPLACEMENT (AVR);  Surgeon: Maude Fleeta Ochoa, MD;  Location: [facility] OR;  Service: Open Heart Surgery;  Laterality: N/A;   ATRIAL FIBRILLATION ABLATION  01/30/2012   PVI by Dr. Kelsie   ATRIAL FIBRILLATION ABLATION N/A 01/31/2012   Procedure: ATRIAL FIBRILLATION ABLATION;  Surgeon: Lynwood Kelsie, MD;  Location: [facility] CATH LAB;  Service: Cardiovascular;  Laterality: N/A;   ATRIAL FIBRILLATION ABLATION N/A 04/19/2020   Procedure: ATRIAL FIBRILLATION ABLATION;  Surgeon: Kelsie Lynwood, MD;  Location: [facility] INVASIVE CV LAB;  Service: Cardiovascular;  Laterality: N/A;   BACK SURGERY     X 3   BIOPSY  01/01/2022   Procedure: BIOPSY;  Surgeon: Elicia Claw, MD;  Location: [facility] ENDOSCOPY;  Service: Gastroenterology;;   CARDIAC CATHETERIZATION N/A 08/09/2015   Procedure: Right/Left Heart Cath and Coronary Angiography;  Surgeon: Peter M Jordan, MD;  Location: [facility] INVASIVE CV LAB;  Service: Cardiovascular;  Laterality: N/A;   CARDIAC CATHETERIZATION N/A 02/05/2016   Procedure:  Right/Left Heart Cath and Coronary Angiography;  Surgeon: Candyce GORMAN Reek, MD;  Location: [facility] INVASIVE CV LAB;  Service: Cardiovascular;  Laterality: N/A;   CARDIAC CATHETERIZATION N/A 04/17/2016   Procedure: Right Heart Cath;  Surgeon: Toribio JONELLE Fuel, MD;  Location: [facility] INVASIVE CV LAB;  Service: Cardiovascular;  Laterality: N/A;   CARDIOVASCULAR STRESS TEST  03/12/2002   EF 48%, NO EVIDENCE OF ISCHEMIA   CARDIOVERSION  01/2012; 03/31/2012   CARDIOVERSION N/A 02/25/2012   Procedure: CARDIOVERSION;  Surgeon: Lynwood Rakers, MD;  Location: [facility] CATH LAB;  Service: Cardiovascular;  Laterality: N/A;   CARDIOVERSION N/A 03/31/2012   Procedure: CARDIOVERSION;  Surgeon: Lynwood Rakers, MD;   Location: [facility] CATH LAB;  Service: Cardiovascular;  Laterality: N/A;   CARDIOVERSION N/A 11/23/2015   Procedure: CARDIOVERSION;  Surgeon: Ezra GORMAN Shuck, MD;  Location: [facility] ENDOSCOPY;  Service: Cardiovascular;  Laterality: N/A;   CARDIOVERSION N/A 04/08/2016   Procedure: CARDIOVERSION;  Surgeon: Toribio JONELLE Fuel, MD;  Location: [facility] ENDOSCOPY;  Service: Cardiovascular;  Laterality: N/A;   CARDIOVERSION N/A 09/14/2018   Procedure: CARDIOVERSION;  Surgeon: Delford Maude BROCKS, MD;  Location: [facility] ENDOSCOPY;  Service: Cardiovascular;  Laterality: N/A;   CARDIOVERSION N/A 10/19/2019   Procedure: CARDIOVERSION;  Surgeon: Fuel Toribio JONELLE, MD;  Location: [facility] ENDOSCOPY;  Service: Cardiovascular;  Laterality: N/A;   CARDIOVERSION N/A 02/25/2020   Procedure: CARDIOVERSION;  Surgeon: Okey Vina GAILS, MD;  Location: [facility] ENDOSCOPY;  Service: Cardiovascular;  Laterality: N/A;   CARDIOVERSION N/A 05/25/2020   Procedure: CARDIOVERSION;  Surgeon: Pietro Redell GORMAN, MD;  Location: [facility] ENDOSCOPY;  Service: Cardiovascular;  Laterality: N/A;   CARDIOVERSION N/A 07/10/2020   Procedure: CARDIOVERSION;  Surgeon: Jeffrie Oneil BROCKS, MD;  Location: [facility] ENDOSCOPY;  Service: Cardiovascular;  Laterality: N/A;   CLIPPING OF ATRIAL APPENDAGE N/A 08/22/2015   Procedure: CLIPPING OF ATRIAL APPENDAGE;  Surgeon: Maude Fleeta Ochoa, MD;  Location: [facility] OR;  Service: Open Heart Surgery;  Laterality: N/A;   COLONOSCOPY WITH PROPOFOL  N/A 01/01/2022   Procedure: COLONOSCOPY WITH PROPOFOL ;  Surgeon: Elicia Claw, MD;  Location: [facility] ENDOSCOPY;  Service: Gastroenterology;  Laterality: N/A;   DOPPLER ECHOCARDIOGRAPHY  03/11/2002   EF 70-75%   FINGER SURGERY Left    Middle finger   FINGER TENDON REPAIR  1980's   right little finger (03/31/2012)   FLEXIBLE SIGMOIDOSCOPY N/A 12/30/2021   Procedure: FLEXIBLE SIGMOIDOSCOPY;  Surgeon: Dianna Specking, MD;  Location: [facility] ENDOSCOPY;  Service: Gastroenterology;  Laterality: N/A;   INSERT / REPLACE / REMOVE  PACEMAKER     St Jude   IR RADIOLOGIST EVAL & MGMT  05/06/2019   IR RADIOLOGIST EVAL & MGMT  10/06/2019   IR RADIOLOGIST EVAL & MGMT  10/31/2020   IR RADIOLOGIST EVAL & MGMT  01/18/2021   IR RADIOLOGIST EVAL & MGMT  03/08/2021   IR RADIOLOGIST EVAL & MGMT  06/13/2021   KIDNEY SURGERY Left    LEFT AND RIGHT HEART CATHETERIZATION WITH CORONARY ANGIOGRAM N/A 10/27/2012   Procedure: LEFT AND RIGHT HEART CATHETERIZATION WITH CORONARY ANGIOGRAM;  Surgeon: Erick JONELLE Bergamo, MD;  Location: [facility] CATH LAB;  Service: Cardiovascular;  Laterality: N/A;   LUMBAR DISC SURGERY  1980's X2;  2000's   MAZE N/A 08/22/2015   Procedure: MAZE;  Surgeon: Maude Fleeta Ochoa, MD;  Location: [facility] OR;  Service: Open Heart Surgery;  Laterality: N/A;   PACEMAKER INSERTION  03/31/2012   STJ Accent DR pacemaker implanted by Dr Rakers   PERMANENT PACEMAKER INSERTION N/A 03/31/2012   Procedure: PERMANENT  PACEMAKER INSERTION;  Surgeon: Lynwood Rakers, MD;  Location: [facility] CATH LAB;  Service: Cardiovascular;  Laterality: N/A;   PPM GENERATOR CHANGEOUT N/A 08/08/2020   Procedure: PPM GENERATOR CHANGEOUT;  Surgeon: Rakers Lynwood, MD;  Location: [facility] INVASIVE CV LAB;  Service: Cardiovascular;  Laterality: N/A;   RADIOLOGY WITH ANESTHESIA Left 02/14/2021   Procedure: RADIOLOGY WITH ANESTHESIA LEFT RENAL CRYOABLATION;  Surgeon: Luverne Aran, MD;  Location: [facility] ORS;  Service: Radiology;  Laterality: Left;   STUMP REVISION Right 10/09/2022   Procedure: REVISION RIGHT BELOW KNEE AMPUTATION;  Surgeon: Harden Jerona GAILS, MD;  Location: [facility] OR;  Service: Orthopedics;  Laterality: Right;   STUMP REVISION Right 11/16/2022   Procedure: REVISION RIGHT BELOW KNEE AMPUTATION;  Surgeon: Harden Jerona GAILS, MD;  Location: [facility] OR;  Service: Orthopedics;  Laterality: Right;   TEE WITHOUT CARDIOVERSION  01/30/2012   Procedure: TRANSESOPHAGEAL ECHOCARDIOGRAM (TEE);  Surgeon: Ezra GORMAN Shuck, MD;  Location: [facility] ENDOSCOPY;  Service: Cardiovascular;  Laterality: N/A;   ablation next day   TEE WITHOUT CARDIOVERSION N/A 08/22/2015   Procedure: TRANSESOPHAGEAL ECHOCARDIOGRAM (TEE);  Surgeon: Maude Fleeta Ochoa, MD;  Location: [facility] OR;  Service: Open Heart Surgery;  Laterality: N/A;   TEE WITHOUT CARDIOVERSION N/A 10/19/2019   Procedure: TRANSESOPHAGEAL ECHOCARDIOGRAM (TEE);  Surgeon: Cherrie Toribio SAUNDERS, MD;  Location: [facility] ENDOSCOPY;  Service: Cardiovascular;  Laterality: N/A;   US  ECHOCARDIOGRAPHY  08/07/2009   EF 55-60%   Patient Active Problem List   Diagnosis Date Noted   AKI (acute kidney injury) (HCC) 12/22/2022   Fall 11/14/2022   Pain of right lower extremity due to injury 11/14/2022   Diabetic ulcer of lower leg (HCC) 11/13/2022   Osteomyelitis (HCC) 11/13/2022   Dyslipidemia 10/18/2022   Cellulitis of right lower extremity 10/17/2022   Dehiscence of amputation stump of right lower extremity (HCC) 10/09/2022   Below-knee amputation of right lower extremity (HCC) 10/09/2022   Gangrene of right foot (HCC) 09/04/2022   History of below-knee amputation of right lower extremity (HCC) 09/04/2022   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) 08/23/2022   CAP (community acquired pneumonia) 12/28/2021   Constipation 12/28/2021   Pain in both lower extremities 07/03/2021   Peripheral neuropathy 07/03/2021   Acute metabolic encephalopathy 06/09/2021   Marijuana dependence (HCC) 06/09/2021   DNR (do not resuscitate) 06/09/2021   Chest pain 06/08/2021   Renal lesion 02/14/2021   Status post cryoablation 02/14/2021   Allergic rhinitis 12/08/2020   Preop pulmonary/respiratory exam 03/27/2020   Acute on chronic diastolic (congestive) heart failure (HCC) 01/22/2020   Abdominal pain 01/22/2020   Hypothyroidism 12/13/2019   DM2 (diabetes mellitus, type 2) (HCC) 12/13/2019   Chronic kidney disease, stage 3b (HCC) 12/13/2019   Acute bacterial bronchitis 12/13/2019   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   Typical atrial flutter (HCC)    Chronic respiratory  failure with hypoxia (HCC) 05/23/2016   CAD (coronary artery disease) 05/23/2016   Persistent atrial fibrillation (HCC)    Atypical atrial flutter (HCC)    Visit for monitoring Tikosyn  therapy 04/16/2016   Chronic diastolic CHF (congestive heart failure) (HCC) 02/03/2016   Cough 12/06/2015   Acute on chronic respiratory failure with hypoxia (HCC) 09/08/2015   Hallucinations 09/04/2015   Lymphadenopathy 09/04/2015   Ascending aortic aneurysm (HCC) 09/04/2015   Hypoxia 09/02/2015   S/P aortic valve replacement with bioprosthetic valve 09/02/2015   Pleural effusion 09/02/2015   S/P AVR 08/22/2015   GERD without esophagitis 09/20/2014   Intrinsic asthma 10/13/2013  Dyspnea 09/15/2013   Pulmonary hypertension (HCC) 09/15/2013   Shortness of breath 05/05/2013   Pacemaker-St.Jude 04/01/2012   Bradycardia 03/31/2012   Second degree AV block 03/31/2012   AV block, 1st degree 02/01/2012   PAF (paroxysmal atrial fibrillation) (HCC) 11/13/2011   Fatigue 11/13/2011   Essential hypertension 11/13/2011   Aortic stenosis 11/13/2011   Sleep apnea 11/13/2011    PCP: Larnell Hamilton, MD  REFERRING PROVIDER: Jerona Sage, MD  ONSET DATE: 04/01/2023 prosthesis delivery  REFERRING DIAG: Z89.511 (ICD-10-CM) - S/P BKA (below knee amputation), right   THERAPY DIAG:  Other abnormalities of gait and mobility  Unsteadiness on feet  Pain in left leg  Pain in right leg  Muscle weakness (generalized)  History of falling  Impaired functional mobility, balance, gait, and endurance  Rationale for Evaluation and Treatment: Rehabilitation  SUBJECTIVE:   SUBJECTIVE STATEMENT: This 74yo male underwent right Transtibial Amputation on 09/04/2022 with revision surgeries on 10/09/22 & 11/16/22.  He fell on 04/15/23 without injuries.  Patient received his prosthesis on 04/01/2023 with Cat[facility] at [facility]. He has been having issues with COPD issues exacerbation which delayed starting PT. He is wearing  prosthesis ~3days/wk for 2-4 hours.  Limited by limb pain. Pt accompanied by: significant other  PERTINENT HISTORY:  right TTA 09/04/22, DM2, HLD, asthma, COPD, A-Fib, CAD, pacemaker, cardioversion & catheterization, arthritis, Fibromyalgia, GERD, HTN,   PAIN:  Are you having pain? Yes: NPRS scale: today 8-9/10 and over last week 6/10 up 10/10 Pain location: both legs Pain description: neuropathy, sharp, sticking big needles in leg Aggravating factors: meds wearing off,  standing & walking Relieving factors: pain pill 2x/day  Residual limb prosthesis on limb 0/10 - 10/10,  stand or walk 5/10 - 10/10  PRECAUTIONS: Fall and ICD/Pacemaker  WEIGHT BEARING RESTRICTIONS: No  FALLS: Has patient fallen in last 6 months? Yes. Number of falls 1 no injuries  LIVING ENVIRONMENT: Lives with: lives with their spouse and 3 dogs (mini schnauzer 15# indoor & 2 german [place] outdoor)  Lives in: [place] Home Access: Ramped entrance Home layout: One level Stairs: Yes: External: 6 steps; can reach both ramp at primary entrance Has following equipment at home: Single point cane, Environmental Consultant - 2 wheeled, Wheelchair (manual), shower chair, Tour manager, and Ramped entry  OCCUPATION: retired from [employer]  PLOF: Independent, Independent with household mobility without device, and Independent with community mobility without device used oxygen  as needed  PATIENT GOALS:  use prosthesis to function in community & in house, do odd repairs, yard work  OBJECTIVE:  COGNITION: Overall cognitive status: Within functional limits for tasks assessed   SENSATION: WFL  CARDIOVASCULAR RESPONSE: Functional activity: balance and gait assessments Pre-activity vitals: no oxygen  seated in w/c HR: 67 SpO2: 93 Post-activity vitals: Berg with no oxygen   HR: 78 SpO2: 90-91%  94% on oxygen  at rest, HR 70 After gait with O2 SpO2 94%, HR 73  Modified Borg scale for dyspnea: 2: mild shortness of breath  POSTURE:  rounded shoulders, forward head, flexed trunk , and weight shift left  LOWER EXTREMITY ROM:  ROM P:passive  A:active Right eval Left eval  Hip flexion    Hip extension standing -10*    Hip abduction    Hip adduction    Hip internal rotation    Hip external rotation    Knee flexion    Knee extension    Ankle dorsiflexion    Ankle plantarflexion    Ankle inversion    Ankle eversion     (  Blank rows = not tested)  LOWER EXTREMITY MMT:  MMT Right eval Left eval  Hip flexion    Hip extension 3-4/5 3-4/5  Hip abduction 3-4/5 4/5  Hip adduction    Hip internal rotation    Hip external rotation    Knee flexion 3-4/5 4/5  Knee extension 3-4/5 4/5  Ankle dorsiflexion    Ankle plantarflexion    Ankle inversion    Ankle eversion    At Evaluation all strength testing is grossly seated and functionally standing / gait. (Blank rows = not tested)  TRANSFERS: Sit to stand: SBA from 20 w/c using BUEs on armrests to arise stabilizes with back of legs against w/c Stand to sit: SBA uses BUEs on armrests of w/c to control descent  FUNCTIONAL TESTs:  Lars Balance Scale: 19/56  [facility] PT Assessment - 06/03/23 1015       Standardized Balance Assessment   Standardized Balance Assessment Berg Balance Test      Berg Balance Test   Sit to Stand Needs minimal aid to stand or to stabilize    Standing Unsupported Able to stand 2 minutes with supervision    Sitting with Back Unsupported but Feet Supported on Floor or Stool Able to sit safely and securely 2 minutes    Stand to Sit Controls descent by using hands    Transfers Able to transfer safely, definite need of hands    Standing Unsupported with Eyes Closed Unable to keep eyes closed 3 seconds but stays steady    Standing Unsupported with Feet Together Needs help to attain position but able to stand for 30 seconds with feet together    From Standing, Reach Forward with Outstretched Arm Reaches forward but needs supervision    From  Standing Position, Pick up Object from Floor Unable to pick up and needs supervision    From Standing Position, Turn to Look Behind Over each Shoulder Needs supervision when turning    Turn 360 Degrees Needs assistance while turning    Standing Unsupported, Alternately Place Feet on Step/Stool Needs assistance to keep from falling or unable to try    Standing Unsupported, One Foot in [employer] balance while stepping or standing    Standing on One Leg Unable to try or needs assist to prevent fall    Total Score 19    Berg comment: BERG  < 36 high risk for falls (close to 100%) 46-51 moderate (>50%)   37-45 significant (>80%) 52-55 lower (> 25%)             GAIT: Gait pattern: step to pattern, decreased step length- Left, decreased stance time- Right, decreased hip/knee flexion- Right, decreased ankle dorsiflexion- Right, Right hip hike, knee flexed in stance- Right, lateral hip instability, trunk flexed, and abducted- Right Distance walked: 25' Assistive device utilized: Environmental Consultant - 2 wheeled and TTA prosthesis Level of assistance: Min A balance loss to left on straight path & 180* turn Comments: used O2 with portable unit slung over left shoulder,  pain in residual limb limited distance,   CURRENT PROSTHETIC WEAR ASSESSMENT: 06/03/2023:  Patient is dependent with: skin check, residual limb care, care of non-amputated limb, prosthetic cleaning, ply sock cleaning, correct ply sock adjustment, proper wear schedule/adjustment, and proper weight-bearing schedule/adjustment Donning prosthesis: dependent with PT 100% cues on technique Doffing prosthesis: reports modified independent Prosthetic wear tolerance: 2-4 hours, 1x/day, ~3 days/week Prosthetic weight bearing tolerance: 5 minutes with partial weight on prosthesis with pain increasing to 8/10 (intolerable) Edema:  pitting with 7 sec capillary refill Residual limb condition: dry skin especially distal limb, 3 scratches with superficial scab  on distal limb, scar healed but adhered over entire area of distal tibia, bulbous shape,  Prosthetic description: silicon liner with pin lock suspension, total contact socket with flexible inner socket, dynamic response foot K code/activity level with prosthetic use: Level 3    TODAY'S TREATMENT:                                                                                                                             DATE:  06/03/2023: Prosthetic Care with TTA prosthesis: See patient education below.  PATIENT EDUCATION: PATIENT EDUCATED ON FOLLOWING PROSTHETIC CARE: Education details:  Skin check, Residual limb care, Prosthetic cleaning, Correct ply sock adjustment, Propper donning, and Proper wear schedule/adjustment Prosthetic wear tolerance: 2 hours 2x/day, 7 days/week Person educated: Patient and Spouse Education method: Explanation, Demonstration, Tactile cues, and Verbal cues Education comprehension: verbalized understanding, verbal cues required, tactile cues required, and needs further education  HOME EXERCISE PROGRAM:  ASSESSMENT:  CLINICAL IMPRESSION: Patient is a 73 y.o. male who was seen today for physical therapy evaluation and treatment for prosthetic training with right Transtibial Amputation. Patient is dependent in prosthetic care & use.  He has limited wear which impairs function during the day.  Berg Balance 19/56 indicates high fall risk and dependency in standing ADLs. Patient has dependent gait using RW.  He has pain in BLEs from neuropathy and in residual limb from socket pressure.  Patient has COPD and uses oxygen  prn.  Patient would benefit from skilled PT to improve functional and safety.   OBJECTIVE IMPAIRMENTS: Abnormal gait, cardiopulmonary status limiting activity, decreased activity tolerance, decreased balance, decreased endurance, decreased knowledge of condition, decreased knowledge of use of DME, decreased mobility, difficulty walking, decreased ROM,  decreased strength, increased edema, prosthetic dependency , and pain.   ACTIVITY LIMITATIONS: carrying, lifting, bending, standing, stairs, transfers, and locomotion level  PARTICIPATION LIMITATIONS: meal prep, cleaning, driving, community activity, and yard work  PERSONAL FACTORS: Fitness, Time since onset of injury/illness/exacerbation, and 3+ comorbidities: see PMH  are also affecting patient's functional outcome.   REHAB POTENTIAL: Good  CLINICAL DECISION MAKING: Evolving/moderate complexity  EVALUATION COMPLEXITY: Moderate   GOALS: Goals reviewed with patient? Yes  SHORT TERM GOALS: Target date: 07/02/2023  Patient donnes prosthesis modified independent & verbalizes proper cleaning. Baseline: SEE OBJECTIVE DATA Goal status: INITIAL 2.  Patient tolerates prosthesis >6 hrs total /day without skin issues or limb pain >5/10 after standing. Baseline: SEE OBJECTIVE DATA Goal status: INITIAL  3.  Patient able to reach 5 and looks to sides without UE support with supervision. Baseline: SEE OBJECTIVE DATA Goal status: INITIAL  4. Patient ambulates 62' with RW & prosthesis with supervision. Baseline: SEE OBJECTIVE DATA Goal status: INITIAL  5. Patient negotiates ramps & curbs with RW & prosthesis with minA. Baseline: SEE OBJECTIVE DATA Goal status: INITIAL  LONG TERM GOALS:  Target date: 08/28/2023  Patient demonstrates & verbalized understanding of prosthetic care to enable safe utilization of prosthesis. Baseline: SEE OBJECTIVE DATA Goal status: INITIAL  Patient tolerates prosthesis wear >90% of awake hours without skin or limb pain issues. Baseline: SEE OBJECTIVE DATA Goal status: INITIAL  Berg Balance >36/56 to indicate lower fall risk Baseline: SEE OBJECTIVE DATA Goal status: INITIAL  Patient ambulates >300' with prosthesis & LRAD independently Baseline: SEE OBJECTIVE DATA Goal status: INITIAL  Patient negotiates ramps, curbs & stairs with single rail with  prosthesis & LRAD independently. Baseline: SEE OBJECTIVE DATA Goal status: INITIAL   PLAN:  PT FREQUENCY: 2x/week  PT DURATION: 12 weeks  PLANNED INTERVENTIONS: 97164- PT Re-evaluation, 97110-Therapeutic exercises, 97530- Therapeutic activity, 97112- Neuromuscular re-education, [phone]- Self Care, [id number]- Gait training, [phone]- Prosthetic training, Patient/Family education, Balance training, Stair training, Scar mobilization, Vestibular training, DME instructions, and Physical Performance Testing  PLAN FOR NEXT SESSION: review prosthetic care, instruct in adjusting ply socks, HEP at sink,   Date of referral: 04/17/2023  PT eval delayed by COPD exacerbation Referring provider: Jerona Sage, MD Referring diagnosis? Z89.511 (ICD-10-CM) - S/P BKA (below knee amputation), right Treatment diagnosis? (if different than referring diagnosis)   Other abnormalities of gait & mobility R26.89 Unsteadiness on feet R26.81 Pain in left leg M79.605 Pain in right leg M79.604 Muscle Weakness (generalized) M62.81 History of falling Z91.81 Impaired functional mobility, balance, gait and endurance  What was this (referring dx) caused by? Surgery (Type: right BKA)  Nature of Condition: Initial Onset (within last 3 months)  prosthesis delivery 04/01/2023   Laterality: Rt  Current Functional Measure Score: Other Berg Balance 19/56  Objective measurements identify impairments when they are compared to normal values, the uninvolved extremity, and prior level of function.  [x]  Yes  []  No  Objective assessment of functional ability: Severe functional limitations   Briefly describe symptoms: dependent in care & use of prosthesis, impaired balance and gait, pain in both legs  How did symptoms start: amputation from wounds  Average pain intensity:  Last 24 hours: 8/10  Past week: 8/10  How often does the pt experience symptoms? Constantly  How much have the symptoms interfered with usual daily  activities? Extremely  How has condition changed since care began at this facility? NA - initial visit  In general, how is the patients overall health? Good   BACK PAIN (STarT Back Screening Tool) No   Grayce Spatz, PT, DPT 06/03/2023, 12:06 PM

## 2023-06-05 ENCOUNTER — Encounter: Payer: Medicare Other | Admitting: Physical Therapy

## 2023-06-10 ENCOUNTER — Ambulatory Visit (HOSPITAL_COMMUNITY): Payer: Medicare Other

## 2023-06-10 ENCOUNTER — Encounter (HOSPITAL_COMMUNITY): Payer: Medicare Other

## 2023-06-11 ENCOUNTER — Telehealth (HOSPITAL_COMMUNITY): Payer: Self-pay

## 2023-06-11 NOTE — Telephone Encounter (Signed)
Called and spoke to pt's wife [name] to confirm/remind patient of their appointment at the [facility] on 06/12/23.   Patient reminded to bring all medications and/or complete list.  Confirmed patient has transportation. Gave directions, instructed to utilize valet parking.  Confirmed appointment prior to ending call.

## 2023-06-12 ENCOUNTER — Ambulatory Visit (HOSPITAL_COMMUNITY)
Admission: RE | Admit: 2023-06-12 | Discharge: 2023-06-12 | Disposition: A | Discharge status: Home / Self Care | Payer: Medicare Other | Source: Ambulatory Visit | Attending: Cardiology

## 2023-06-12 ENCOUNTER — Ambulatory Visit (HOSPITAL_COMMUNITY)
Admission: RE | Admit: 2023-06-12 | Discharge: 2023-06-12 | Disposition: A | Discharge status: Home / Self Care | Payer: Medicare Other | Source: Ambulatory Visit | Attending: Cardiology | Admitting: Cardiology

## 2023-06-12 VITALS — BP 142/78 | HR 63

## 2023-06-12 DIAGNOSIS — Z9889 Other specified postprocedural states: Secondary | ICD-10-CM | POA: Diagnosis not present

## 2023-06-12 DIAGNOSIS — I10 Essential (primary) hypertension: Secondary | ICD-10-CM | POA: Diagnosis not present

## 2023-06-12 DIAGNOSIS — I5032 Chronic diastolic (congestive) heart failure: Secondary | ICD-10-CM | POA: Diagnosis not present

## 2023-06-12 DIAGNOSIS — I272 Pulmonary hypertension, unspecified: Secondary | ICD-10-CM | POA: Insufficient documentation

## 2023-06-12 DIAGNOSIS — E785 Hyperlipidemia, unspecified: Secondary | ICD-10-CM | POA: Insufficient documentation

## 2023-06-12 DIAGNOSIS — J9611 Chronic respiratory failure with hypoxia: Secondary | ICD-10-CM | POA: Insufficient documentation

## 2023-06-12 DIAGNOSIS — I130 Hypertensive heart and chronic kidney disease with heart failure and stage 1 through stage 4 chronic kidney disease, or unspecified chronic kidney disease: Secondary | ICD-10-CM | POA: Diagnosis present

## 2023-06-12 DIAGNOSIS — Z7901 Long term (current) use of anticoagulants: Secondary | ICD-10-CM | POA: Insufficient documentation

## 2023-06-12 DIAGNOSIS — Z9981 Dependence on supplemental oxygen: Secondary | ICD-10-CM | POA: Diagnosis not present

## 2023-06-12 DIAGNOSIS — J441 Chronic obstructive pulmonary disease with (acute) exacerbation: Secondary | ICD-10-CM | POA: Insufficient documentation

## 2023-06-12 DIAGNOSIS — Z79899 Other long term (current) drug therapy: Secondary | ICD-10-CM | POA: Insufficient documentation

## 2023-06-12 DIAGNOSIS — R0602 Shortness of breath: Secondary | ICD-10-CM

## 2023-06-12 DIAGNOSIS — Z7984 Long term (current) use of oral hypoglycemic drugs: Secondary | ICD-10-CM | POA: Insufficient documentation

## 2023-06-12 DIAGNOSIS — Z950 Presence of cardiac pacemaker: Secondary | ICD-10-CM | POA: Insufficient documentation

## 2023-06-12 DIAGNOSIS — I2510 Atherosclerotic heart disease of native coronary artery without angina pectoris: Secondary | ICD-10-CM | POA: Diagnosis not present

## 2023-06-12 DIAGNOSIS — I083 Combined rheumatic disorders of mitral, aortic and tricuspid valves: Secondary | ICD-10-CM | POA: Insufficient documentation

## 2023-06-12 DIAGNOSIS — I4819 Other persistent atrial fibrillation: Secondary | ICD-10-CM | POA: Diagnosis not present

## 2023-06-12 DIAGNOSIS — E1122 Type 2 diabetes mellitus with diabetic chronic kidney disease: Secondary | ICD-10-CM | POA: Insufficient documentation

## 2023-06-12 DIAGNOSIS — N1832 Chronic kidney disease, stage 3b: Secondary | ICD-10-CM | POA: Diagnosis not present

## 2023-06-12 DIAGNOSIS — Z953 Presence of xenogenic heart valve: Secondary | ICD-10-CM | POA: Diagnosis not present

## 2023-06-12 LAB — BASIC METABOLIC PANEL
Anion gap: 7 (ref 5–15)
BUN: 22 mg/dL (ref 8–23)
CO2: 25 mmol/L (ref 22–32)
Calcium: 9.1 mg/dL (ref 8.9–10.3)
Chloride: 101 mmol/L (ref 98–111)
Creatinine, Ser: 1.52 mg/dL — ABNORMAL HIGH (ref 0.61–1.24)
GFR, Estimated: 48 mL/min — ABNORMAL LOW (ref >60)
Glucose, Bld: 114 mg/dL — ABNORMAL HIGH (ref 70–99)
Potassium: 4.4 mmol/L (ref 3.5–5.1)
Sodium: 133 mmol/L — ABNORMAL LOW (ref 135–145)

## 2023-06-12 LAB — ECHOCARDIOGRAM COMPLETE
AR max vel: 1.49 cm2
AV Area VTI: 1.58 cm2
AV Area mean vel: 1.54 cm2
AV Mean grad: 9 mm[Hg]
AV Peak grad: 18.5 mm[Hg]
Ao pk vel: 2.15 m/s
Area-P 1/2: 5.66 cm2
S' Lateral: 3.1 cm

## 2023-06-12 NOTE — Progress Notes (Signed)
ADVANCED HEART FAILURE FOLLOW UP CLINIC NOTE  Referring Physician: Alysia Penna, MD  Primary Care: Alysia Penna, MD Primary Cardiologist:Dr. Gala Romney   HPI: [name] is a 73 y.o. 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 Diuresed with IV lasix with rapid improvement of symptoms. LHC 02/05/16 with no significant CAD. RHC showed significant difference between PCWP (50) and LVEDP (30) suggestive of MS but no MS on Echo.    Admitted 12/17 for Tikosyn load. RHC completed during that admission as below.   Admitted 6/21 with recurrent respiratory failure and lactic acidosis felt to be mostly COPD flare. Had recurrent AF and underwent TEE-DC-CV on 10/19/19. Tikosyn continued.  Admitted 9/21 with SOB/chills, leukocytosis (WBC 15.9k), concerning for COVID infection versus COPD exacerbation. Diuresed with IV lasix on presentation,    02/25/20 DCCV with Dr. Tenny Craw maintained NSR only for 5 hrs.Underwent repeat AF ablation 04/19/20. Returned to AF 04/21/20.   Repeat DC-CV 05/25/20 -> AF  Repeat DC-CV 07/11/19. PPM at [facility]   PPM generator change 4/22.   Admitted 2/23 with CP. Echo showed EF 55-60%, RV normal. Cards consulted and felt CP was atypical and no further work up.   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    SUBJECTIVE: Today he returns for HF follow up.Complaining of pain all over and nausea. Followed at the pain clinic. Chronically short of breath with exertion. Denies PND/Orthopnea. Wears oxygen 3 liters [place]. Appetite ok. No fever or chills. Unable to weigh at home.  Has not had medications this morning.   PMH, current medications, allergies, social history, and family history reviewed in epic.  PHYSICAL EXAM: Vitals:    06/12/23 1055  BP: (!) 142/78  Pulse: 63  SpO2: 98%    General:  Appears frail. Arrived in a wheelchair.  HEENT: normal Neck: supple. no JVD. Carotids 2+ bilat; no bruits. No lymphadenopathy or thryomegaly appreciated. Cor: PMI nondisplaced. Regular rate & rhythm. No rubs, gallops or murmurs. Lungs: Coarse with some scattered EW Abdomen: soft Extremities: no cyanosis, clubbing, rash, edema. R BKA  Neuro: alert & oriented x3  ASSESSMENT & PLAN:  1. Chronic diastolic CHF with RV failure - Echo (10/17): EF 60-65%, Grade 3 DD. Mild AI, Mild MR, RV mildly dilated and severely reduced. RA mildly dilated. PA peak pressure 74 mm Hg. Concerns for restrictive cardiomyopathy. - Echo (7/19): EF 60-65% AVR ok  - TEE (6/21): EF 60% RV moderately HK septal flattening RVSP 90s - Echo (3/22): EF 55-60% RV ok. Mild MR/TR. Bioprosthetic AVR ok. Restrictive physiology. - Echo (2/23): EF 55-60%, normal RV, bioprosthetic AVR ok - Echo today EF remains 55-60%. Dr Elwyn Lade reviewed and I provided the results and discussed with him and his wife.   -NYHA III . Volume status appears stable.  - Continue Jardiance 10 mg daily. -Continue torsemide to 40 mg daily  - Continue spironolactone 25 mg daily.   - Continue bisoprolol 5 mg daily.  2. Persistent Afib s/p AF ablation x 2/Maze s/p pacemaker - TEE-DC-CV on 10/19/19 & DCCV on 02/25/20 - Repeat AF ablation 04/19/20. Returned to AF 04/21/20.  - DC-CV 05/25/20 -> AF - DC-CV 07/11/19. Back in AFL 3/22.  - Off Tikosyn. Per  EP, not a candidate for further anti-arrhythmics or procedures.  - Rate controlled. No bleeding issues.  - Continue Eliquis 5 mg bid.   3. AS s/p AVR - Stable on echo 3/22, AV mean gradient 7 mmHg - aware of need for SBE prophylaxis - Echo (2/23),mean gradient 11.0 mmHg     4 Pulmonary HTN - suspect pulmonary venous hypertension - RHC in 10/17 with pulmonary venous HTN primarily. No change. - TEE (6/21) EF 60% RV moderately HK septal  flattening RVSP 90s in setting of COPD flare and PAF. - Echo (3/22) RV looks better. TR insufficient to measure RVSP - Echo (2/23): EF 55-60%, RV ok, RVSP 32.4 mmHg, AV mean gradient 11.0 mmHg, AV peak gradient 20.8 mmHg, VTI 1.63 cm   5. Mild CAD by cath 01/2016 - No chest pain.   - Continue Crestor 10 mg daily   6. Chronic respiratory failure with hypoxia - Continue with 2L home O2   7. HTN -Stable. Has not had meds today.    8. DM2 - Contiue Jardiance.  - Last Hgb A1c 6.6 (2/23)   9. 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 Kathrene Bongo - Check BMET today.   Follow with Dr Gala Romney in 3 months.     Ellyson Rarick NP-C  11:14 AM  06/12/23

## 2023-06-12 NOTE — Patient Instructions (Signed)
Medication Changes:  No Changes In Medications at this time.   Lab Work:  Labs done today, your results will be available in MyChart, we will contact you for abnormal readings.  Follow-Up in: 3 MONTHS WITH DR. Gala Romney PLEASE CALL OUR OFFICE AROUND MARCH TO GET SCHEDULED FOR YOUR APPOINTMENT. PHONE NUMBER IS [phone] OPTION 2   At the [facility], you and your health needs are our priority. We have a designated team specialized in the treatment of Heart Failure. This Care Team includes your primary Heart Failure Specialized Cardiologist (physician), Advanced Practice Providers (APPs- Physician Assistants and Nurse Practitioners), and Pharmacist who all work together to provide you with the care you need, when you need it.   You may see any of the following providers on your designated Care Team at your next follow up:  Dr. Arvilla Meres Dr. Marca Ancona Dr. Dorthula Nettles Dr. Theresia Bough Tonye Becket, NP Robbie Lis, [place] [facility] [place], [place] Brynda Peon, NP [place] Lee, NP Karle Plumber, PharmD   Please be sure to bring in all your medications bottles to every appointment.   Need to Contact us:  If you have any questions or concerns before your next appointment please send [place] a message through [place] or call our office at [phone].    TO LEAVE A MESSAGE FOR THE NURSE SELECT OPTION 2, PLEASE LEAVE A MESSAGE INCLUDING: YOUR NAME DATE OF BIRTH CALL BACK NUMBER REASON FOR CALL**this is important as we prioritize the call backs  YOU WILL RECEIVE A CALL BACK THE SAME DAY AS LONG AS YOU CALL BEFORE 4:00 PM

## 2023-06-13 ENCOUNTER — Telehealth (HOSPITAL_COMMUNITY): Payer: Self-pay

## 2023-06-13 NOTE — Telephone Encounter (Signed)
  ADVANCED HEART FAILURE CLINIC   Pre-operative Risk Assessment   HEARTCARE STAFF-IMPORTANT INSTRUCTIONS 1 Red and Blue Text will auto delete once note is signed or closed. 2 Press F2 to navigate through template.   3 On drop down lists, L click to select >> R click to activate next field 4 Reason for Visit format is IMPORTANT!!  See Directions on No. 2 below. 5 Please review chart to determine if there is already a clearance note open for this procedure!!  DO NOT duplicate if a note already exists!!    :1}      Request for Surgical Clearance    Procedure:   L5-S1 ES1  Date of Surgery:  Clearance TBD                                 Surgeon:  Dr. Lorrine Kin Surgeon's Group or Practice Name:  [facility] Phone number: [phone]  Fax number:  [phone]   Type of Clearance Requested:   - Pharmacy:  Hold Apixaban (Eliquis) 3 days Prior   Type of Anesthesia:  Not Indicated   Additional requests/questions:  Please advise surgeon/provider what medications should be held.  Signed, Linda Hedges   06/13/2023, 2:58 PM   [facility] Arvilla Meres  [facility] [address] [facility] [place] [address] [phone] (office) [phone] (fax)

## 2023-06-14 ENCOUNTER — Emergency Department (HOSPITAL_COMMUNITY): Payer: Medicare Other

## 2023-06-14 ENCOUNTER — Other Ambulatory Visit: Payer: Self-pay

## 2023-06-14 ENCOUNTER — Encounter (HOSPITAL_COMMUNITY): Payer: Self-pay

## 2023-06-14 ENCOUNTER — Emergency Department (HOSPITAL_COMMUNITY)
Admission: EM | Admit: 2023-06-14 | Discharge: 2023-06-14 | Disposition: A | Discharge status: Home / Self Care | Payer: Medicare Other | Attending: Emergency Medicine | Admitting: Emergency Medicine

## 2023-06-14 DIAGNOSIS — I110 Hypertensive heart disease with heart failure: Secondary | ICD-10-CM | POA: Insufficient documentation

## 2023-06-14 DIAGNOSIS — S0081XA Abrasion of other part of head, initial encounter: Secondary | ICD-10-CM | POA: Insufficient documentation

## 2023-06-14 DIAGNOSIS — E119 Type 2 diabetes mellitus without complications: Secondary | ICD-10-CM | POA: Diagnosis not present

## 2023-06-14 DIAGNOSIS — S161XXA Strain of muscle, fascia and tendon at neck level, initial encounter: Secondary | ICD-10-CM | POA: Insufficient documentation

## 2023-06-14 DIAGNOSIS — Z79899 Other long term (current) drug therapy: Secondary | ICD-10-CM | POA: Insufficient documentation

## 2023-06-14 DIAGNOSIS — S0990XA Unspecified injury of head, initial encounter: Secondary | ICD-10-CM

## 2023-06-14 DIAGNOSIS — Z950 Presence of cardiac pacemaker: Secondary | ICD-10-CM | POA: Diagnosis not present

## 2023-06-14 DIAGNOSIS — J449 Chronic obstructive pulmonary disease, unspecified: Secondary | ICD-10-CM | POA: Diagnosis not present

## 2023-06-14 DIAGNOSIS — U071 COVID-19: Secondary | ICD-10-CM | POA: Diagnosis not present

## 2023-06-14 DIAGNOSIS — I509 Heart failure, unspecified: Secondary | ICD-10-CM | POA: Insufficient documentation

## 2023-06-14 DIAGNOSIS — W19XXXA Unspecified fall, initial encounter: Secondary | ICD-10-CM

## 2023-06-14 DIAGNOSIS — W01198A Fall on same level from slipping, tripping and stumbling with subsequent striking against other object, initial encounter: Secondary | ICD-10-CM | POA: Diagnosis not present

## 2023-06-14 DIAGNOSIS — R519 Headache, unspecified: Secondary | ICD-10-CM | POA: Diagnosis present

## 2023-06-14 DIAGNOSIS — Z7901 Long term (current) use of anticoagulants: Secondary | ICD-10-CM | POA: Diagnosis not present

## 2023-06-14 DIAGNOSIS — Z7984 Long term (current) use of oral hypoglycemic drugs: Secondary | ICD-10-CM | POA: Insufficient documentation

## 2023-06-14 DIAGNOSIS — Z7951 Long term (current) use of inhaled steroids: Secondary | ICD-10-CM | POA: Insufficient documentation

## 2023-06-14 LAB — RESP PANEL BY RT-PCR (RSV, FLU A&B, COVID)  RVPGX2
Influenza A by PCR: NEGATIVE
Influenza B by PCR: NEGATIVE
Resp Syncytial Virus by PCR: NEGATIVE
SARS Coronavirus 2 by RT PCR: POSITIVE — AB

## 2023-06-14 MED ORDER — OXYCODONE-ACETAMINOPHEN 5-325 MG PO TABS: 1.0000 | ORAL_TABLET | Freq: Four times a day (QID) | ORAL | 0 refills | Status: DC | PRN

## 2023-06-14 MED ORDER — OXYCODONE-ACETAMINOPHEN 5-325 MG PO TABS
1.0000 | ORAL_TABLET | Freq: Once | ORAL | Status: AC
  Administered 2023-06-14: 1 via ORAL
  Filled 2023-06-14: qty 1

## 2023-06-14 NOTE — Discharge Instructions (Addendum)
Takes the oxycodone as directed.  Follow-up with your primary care doctor.  Today's testing showed that you have COVID.  CT head and neck showed no traumatic injury to the head or neck from the fall.  Most your symptoms are probably COVID related.  Some of them are probably related from the chronic pain and being out of pain medicine.  Follow-up with your pain management doctor.  Also would recommend following up with your primary care provider regarding the rash.

## 2023-06-14 NOTE — ED Triage Notes (Signed)
Pt complaining of headache, congestion, and nausea for a week. Also has a rash on the  crown of his head for the last 3-4 days.

## 2023-06-14 NOTE — ED Provider Notes (Addendum)
[place] [facility]MENT AT [facility] Provider Note   CSN: [id number] Arrival date & time: 06/14/23  1846     History  Chief Complaint  Patient presents with   Headache    [name] is a 73 y.o. male.  Patient with a complaint of headache congestion cough for little bit over a week.  Also had a fall in his wheelchair on gravel fell backwards hit the back of his head and neck.  Patient has a rash on the crown of the head.  He also has a history of chronic pain.  Recently plugged back in with pain management.  Last received pain medication January 20 for 20 days.  Patient has not had any vomiting or diarrhea but has had nausea.  Patient is normally on oxygen.  2 L at all times.  Oxygen saturation here is in the upper 90s.  Respiratory rate 16 temp 98.3 blood pressure is 139/66.  Past medical history significant for fibromyalgia hypertension hyperlipidemia mitral regurgitation aortic stenosis that is mild has a pacemaker followed by [facility] for congestive heart failure also has COPD and type 2 diabetes.  Patient former smoker quit 1993.  For the rash on the scalp they do have a cream that one of their providers provided that they have been applying.  In addition patient states he can hurts all over.  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       Home Medications Prior to Admission medications   Medication Sig Start Date End Date Taking? Authorizing Provider  acetaminophen (TYLENOL) 325 MG tablet Take 2 tablets (650 mg total) by mouth every 6 (six) hours as needed. 12/24/22   Lockie Mola, MD  albuterol (VENTOLIN HFA) 108 (90 Base) MCG/ACT inhaler Inhale 2 puffs into the lungs every 6 (six) hours as needed for wheezing or shortness of breath. 05/07/23   Mannam, Colbert Coyer, MD  amLODipine (NORVASC) 5 MG tablet Take 0.5 tablets (2.5 mg total) by mouth in the morning. 05/30/23   Romie Minus, MD  apixaban (ELIQUIS) 5 MG TABS tablet Take 1 tablet  (5 mg total) by mouth 2 (two) times daily. 08/21/22   Graciella Freer, PA-C  bisoprolol (ZEBETA) 5 MG tablet Take 1 tablet (5 mg total) by mouth daily. Please call office to arrange follow up appointment 11/29/22   Bensimhon, Bevelyn Buckles, MD  Budeson-Glycopyrrol-Formoterol (BREZTRI AEROSPHERE) 160-9-4.8 MCG/ACT AERO Inhale 2 puffs into the lungs in the morning and at bedtime. 05/07/23   Chilton Greathouse, MD  Cholecalciferol (VITAMIN D) 50 MCG (2000 UT) tablet Take 2 tablets (4,000 Units total) by mouth daily. 12/24/22   Lockie Mola, MD  empagliflozin (JARDIANCE) 10 MG TABS tablet Take 1 tablet (10 mg total) by mouth daily. 05/27/22   Bensimhon, Bevelyn Buckles, MD  gabapentin (NEURONTIN) 300 MG capsule Take 2 capsules (600 mg total) by mouth 3 (three) times daily. for neuropathy 10/19/22   Rai, Ripudeep K, MD  ipratropium-albuterol (DUONEB) 0.5-2.5 (3) MG/3ML SOLN Take 3 mLs by nebulization every 6 (six) hours as needed. 05/07/23 05/06/24  Mannam, Colbert Coyer, MD  levothyroxine (SYNTHROID, LEVOTHROID) 175 MCG tablet Take 175 mcg by mouth daily before breakfast.    [provider]  Menthol-Camphor (TIGER BALM ARTHRITIS RUB EX) Apply 1 application topically as needed (for leg pain).    [provider]  montelukast (SINGULAIR) 10 MG tablet Take 1 tablet (10 mg total) by mouth at bedtime. 05/07/23   Chilton Greathouse, MD  Multiple  Minerals-Vitamins (CAL-MAG-ZINC-D) TABS Take 1 tablet by mouth at bedtime.    [provider]  Multiple Vitamin (MULTIVITAMIN WITH MINERALS) TABS tablet Take 1 tablet by mouth daily. 12/15/19   Sheikh, Omair Latif, DO  ondansetron (ZOFRAN) 4 MG tablet Take 1 tablet (4 mg total) by mouth every 6 (six) hours. 01/31/23   Rexford Maus, DO  oxyCODONE-acetaminophen (PERCOCET/ROXICET) 5-325 MG tablet Take 1 tablet by mouth every 8 (eight) hours as needed for severe pain. 12/31/22   [name], MD  OXYGEN Inhale 2 L/min into the lungs continuous.    [provider]  pantoprazole (PROTONIX) 40 MG tablet Take 40 mg by mouth daily before breakfast.    [provider]  Polyethyl Glyc-Propyl Glyc PF (SYSTANE ULTRA PF) 0.4-0.3 % SOLN Place 2 drops into both eyes daily as needed (dry eyes).    [provider]  polyethylene glycol (MIRALAX / GLYCOLAX) 17 g packet Take 17 g by mouth 2 (two) times daily as needed. 12/19/22   Pricilla Loveless, MD  potassium chloride SA (KLOR-CON M) 20 MEQ tablet Take 20 mEq by mouth 2 (two) times daily.    [provider]  predniSONE (DELTASONE) 2.5 MG tablet Take 1 tablet (2.5 mg total) by mouth daily with breakfast. 05/07/23   Mannam, Praveen, MD  rOPINIRole (REQUIP) 1 MG tablet Take 1 tablet (1 mg total) by mouth in the morning and at bedtime. Patient taking differently: Take 1 mg by mouth daily. 01/23/20   Glade Lloyd, MD  rosuvastatin (CRESTOR) 10 MG tablet Take 10 mg by mouth at bedtime.  08/08/16   [provider]  senna-docusate (SENOKOT-S) 8.6-50 MG tablet Take 1 tablet by mouth 2 (two) times daily.    [provider]  spironolactone (ALDACTONE) 25 MG tablet Take 1 tablet (25 mg total) by mouth daily. 02/16/16   Graciella Freer, PA-C  torsemide (DEMADEX) 20 MG tablet Take 2 tablets (40 mg total) by mouth daily. 05/16/23   Romie Minus, MD      Allergies    Cephalexin, Meloxicam, and Vancomycin    Review of Systems   Review of Systems  Constitutional:  Negative for chills and fever.  HENT:  Positive for congestion. Negative for ear pain and sore throat.   Eyes:  Negative for pain and visual disturbance.  Respiratory:  Positive for cough. Negative for shortness of breath.   Cardiovascular:  Negative for chest pain and palpitations.  Gastrointestinal:  Negative for abdominal pain and vomiting.  Genitourinary:  Negative for dysuria and hematuria.  Musculoskeletal:  Positive for myalgias and neck pain. Negative for arthralgias and back pain.  Skin:  Positive for  rash. Negative for color change.  Neurological:  Positive for headaches. Negative for seizures and syncope.  All other systems reviewed and are negative.   Physical Exam Updated Vital Signs BP 139/66 (BP Location: Left Arm)   Pulse 68   Temp 98.3 F (36.8 C) (Oral)   Resp 16   Ht 1.803 m (5\' 11" )   Wt 83.9 kg   SpO2 98%   BMI 25.80 kg/m  Physical Exam Vitals and nursing note reviewed.  Constitutional:      General: He is not in acute distress.    Appearance: Normal appearance. He is well-developed.  HENT:     Head: Normocephalic and atraumatic.  Eyes:     Extraocular Movements: Extraocular movements intact.     Conjunctiva/sclera: Conjunctivae normal.     Pupils: Pupils are  equal, round, and reactive to light.  Cardiovascular:     Rate and Rhythm: Normal rate and regular rhythm.     Heart sounds: No murmur heard. Pulmonary:     Effort: Pulmonary effort is normal. No respiratory distress.     Breath sounds: No stridor. Rhonchi present. No wheezing.  Abdominal:     Palpations: Abdomen is soft.     Tenderness: There is no abdominal tenderness.  Musculoskeletal:        General: No swelling.     Cervical back: Neck supple.     Comments: Right below the knee amputation.  Left leg with erythema but not warm.  Skin:    General: Skin is warm and dry.     Capillary Refill: Capillary refill takes less than 2 seconds.     Findings: Rash present.     Comments: There is a macular type rash that he is kind of scratched on the top of his crown of his head.  No secondary infection.  No vesicular aspect to it.  No peeling of the skin.  Neurological:     Mental Status: He is alert.  Psychiatric:        Mood and Affect: Mood normal.     ED Results / Procedures / Treatments   Labs (all labs ordered are listed, but only abnormal results are displayed) Labs Reviewed  RESP PANEL BY RT-PCR (RSV, FLU A&B, COVID)  [id number] - Abnormal; Notable for the following components:      Result  Value   SARS Coronavirus 2 by RT PCR POSITIVE (*)    All other components within normal limits  CBC WITH DIFFERENTIAL/PLATELET  BASIC METABOLIC PANEL    EKG None  Radiology CT Cervical Spine Wo Contrast Result Date: 06/14/2023 CLINICAL DATA:  Neck trauma (Age >= 65y) EXAM: CT CERVICAL SPINE WITHOUT CONTRAST TECHNIQUE: Multidetector CT imaging of the cervical spine was performed without intravenous contrast. Multiplanar CT image reconstructions were also generated. RADIATION DOSE REDUCTION: This exam was performed according to the departmental dose-optimization program which includes automated exposure control, adjustment of the mA and/or kV according to patient size and/or use of iterative reconstruction technique. COMPARISON:  11/13/2022 FINDINGS: Mild motion artifact limitations. Alignment: No traumatic subluxation. Slight dextroscoliotic curvature again seen. Skull base and vertebrae: No acute fracture. Vertebral body heights are maintained. The dens and skull base are intact. Soft tissues and spinal canal: No prevertebral fluid or swelling. No visible canal hematoma. Disc levels: Disc space narrowing and spurring at C4-C5, C5-C6, and C6-C7, similar to prior exam. Left facet hypertrophy at C2-C3. Upper chest: Punctate granuloma in the left upper lobe, benign. This needs no further imaging follow-up. No acute findings. Other: None. IMPRESSION: 1. No fracture or acute findings of the cervical spine. 2. Chronic degenerative change. Electronically Signed   By: Narda Rutherford M.D.   On: 06/14/2023 21:16   CT Head Wo Contrast Result Date: 06/14/2023 CLINICAL DATA:  Headache, nausea and congestion.  Rash on head. EXAM: CT HEAD WITHOUT CONTRAST TECHNIQUE: Contiguous axial images were obtained from the base of the skull through the vertex without intravenous contrast. RADIATION DOSE REDUCTION: This exam was performed according to the departmental dose-optimization program which includes automated exposure  control, adjustment of the mA and/or kV according to patient size and/or use of iterative reconstruction technique. COMPARISON:  Head CT 11/13/2022 FINDINGS: Brain: No intracranial hemorrhage, mass effect, or midline shift. Stable degree of atrophy and chronic small vessel ischemia. No hydrocephalus. The basilar cisterns  are patent. No evidence of territorial infarct or acute ischemia. No extra-axial or intracranial fluid collection. Vascular: Atherosclerosis of skullbase vasculature without hyperdense vessel or abnormal calcification. Skull: No fracture or focal lesion. Sinuses/Orbits: No acute finding. Other: No focal scalp soft tissue abnormality. IMPRESSION: 1. No acute intracranial abnormality. 2. Stable atrophy and chronic small vessel ischemia. Electronically Signed   By: Narda Rutherford M.D.   On: 06/14/2023 21:12   DG Chest Port 1 View Result Date: 06/14/2023 CLINICAL DATA:  Cough EXAM: PORTABLE CHEST 1 VIEW COMPARISON:  04/15/2023 FINDINGS: Left-sided implanted cardiac device in stable positioning. Stable cardiomediastinal contours status post sternotomy, cardiac valve replacement, and left atrial appendage clipping. Aortic atherosclerosis. No focal airspace consolidation, pleural effusion, or pneumothorax. IMPRESSION: No active disease. Electronically Signed   By: Duanne Guess D.O.   On: 06/14/2023 20:48    Procedures Procedures    Medications Ordered in ED Medications  oxyCODONE-acetaminophen (PERCOCET/ROXICET) 5-325 MG per tablet 1 tablet (1 tablet Oral Given 06/14/23 2036)    ED Course/ Medical Decision Making/ A&P                                 Medical Decision Making Amount and/or Complexity of Data Reviewed Labs: ordered. Radiology: ordered.  Risk Prescription drug management.   Patient's respiratory panel positive for COVID.  Patient's wife thinks that the onset of symptoms were over a week ago.  Not sure that Paxlovid would be of any benefit.  Due to the fall with  CT does head and neck no acute findings there.  Patient also had chest x-ray done no evidence of pneumonia or any acute findings there.  Patient was treated here with oxycodone with a lot of significant improvement in his pain.  Patient has a history of fibromyalgia and chronic pain.  Currently getting back in with pain management.  Currently out of his pain medicines.  The patient had no injury secondary to the fall in his wheelchair.  Patient seems not to have any complication secondary to the COVID.  Should be able to renew short course of pain medication for him.  Rash would recommend continue the current therapy and his follow-up with primary care doctor regarding that.  Will not treat with Paxlovid because it is hard to say exactly when the symptoms started patient's wife thinks that there is a least a 1 over a week.   Final Clinical Impression(s) / ED Diagnoses Final diagnoses:  COVID  Fall, initial encounter  Injury of head, initial encounter  Strain of neck muscle, initial encounter    Rx / DC Orders ED Discharge Orders     None         Vanetta Mulders, MD 06/14/23 2131    Vanetta Mulders, MD 06/14/23 2200

## 2023-06-17 NOTE — Telephone Encounter (Signed)
Clearance form faxed via Epic

## 2023-06-17 NOTE — Progress Notes (Signed)
 Remote pacemaker transmission.

## 2023-06-18 ENCOUNTER — Encounter: Payer: Medicare Other | Admitting: Physical Therapy

## 2023-06-19 ENCOUNTER — Ambulatory Visit: Payer: Medicare Other | Admitting: Orthopedic Surgery

## 2023-06-23 ENCOUNTER — Ambulatory Visit: Payer: Medicare Other | Admitting: Orthopedic Surgery

## 2023-06-23 ENCOUNTER — Encounter: Payer: Self-pay | Admitting: Physical Therapy

## 2023-06-23 ENCOUNTER — Ambulatory Visit (INDEPENDENT_AMBULATORY_CARE_PROVIDER_SITE_OTHER): Payer: Medicare Other | Admitting: Physical Therapy

## 2023-06-23 DIAGNOSIS — M79604 Pain in right leg: Secondary | ICD-10-CM | POA: Diagnosis not present

## 2023-06-23 DIAGNOSIS — I87332 Chronic venous hypertension (idiopathic) with ulcer and inflammation of left lower extremity: Secondary | ICD-10-CM

## 2023-06-23 DIAGNOSIS — R2689 Other abnormalities of gait and mobility: Secondary | ICD-10-CM

## 2023-06-23 DIAGNOSIS — M79605 Pain in left leg: Secondary | ICD-10-CM

## 2023-06-23 DIAGNOSIS — Z7409 Other reduced mobility: Secondary | ICD-10-CM

## 2023-06-23 DIAGNOSIS — R2681 Unsteadiness on feet: Secondary | ICD-10-CM | POA: Diagnosis not present

## 2023-06-23 DIAGNOSIS — Z89511 Acquired absence of right leg below knee: Secondary | ICD-10-CM

## 2023-06-23 DIAGNOSIS — Z9181 History of falling: Secondary | ICD-10-CM

## 2023-06-23 DIAGNOSIS — M6281 Muscle weakness (generalized): Secondary | ICD-10-CM

## 2023-06-23 NOTE — Therapy (Addendum)
 OUTPATIENT PHYSICAL THERAPY PROSTHETIC TREATMENT   Patient Name: [name] MRN: [id number] DOB:22-Jan-1951, 73 y.o., male Today's Date: 06/23/2023  END OF SESSION:  PT End of Session - 06/23/23 1125     Visit Number 2    Number of Visits 25    Date for PT Re-Evaluation 08/28/23    Authorization Type [employer] Medicare    Progress Note Due on Visit 10    PT Start Time 1123    PT Stop Time 1150    PT Time Calculation (min) 27 min    Equipment Utilized During Treatment Gait belt    Activity Tolerance Patient tolerated treatment well;Patient limited by pain;Patient limited by fatigue    Behavior During Therapy [facility] for tasks assessed/performed              Past Medical History:  Diagnosis Date   Aortic stenosis, mild    Arthritis of hand    "just a little bit in both hands" (03/31/2012)   Asthma    "little bit" (03/31/2012)   Atrial fibrillation (HCC)    dx '04; DCCV '04, placed on flecainide, failed DCCV 04/2010, flecainide stopped; s/p successful A.fib ablation 01/31/12   CHF (congestive heart failure) (HCC)    Controlled type 2 diabetes mellitus without complication, without long-term current use of insulin (HCC) 12/13/2019   COPD (chronic obstructive pulmonary disease) (HCC)    DJD (degenerative joint disease)    Fatty liver    Fibromyalgia    GERD (gastroesophageal reflux disease)    Hyperlipidemia    Hypertension    Hypothyroidism    S/P radiation   Left atrial enlargement    LA size 45mm by echo 11/21/11   Mitral regurgitation    trivial   Obstructive sleep apnea    mild by sleep study 2013; pt stated he does not have a machine because "it wasn't bad enough for him to have one"   Pacemaker 03/31/2012   Pneumonia    last time 2023   Restless leg syndrome    Second degree AV block    Splenomegaly    Past Surgical History:  Procedure Laterality Date   AMPUTATION Right 08/23/2022   Procedure: RIGHT FOOT FIFTH RAY AMPUTATION;  Surgeon: [name], MD;   Location: [facility] OR;  Service: Orthopedics;  Laterality: Right;   AMPUTATION Right 09/04/2022   Procedure: RIGHT BELOW KNEE AMPUTATION;  Surgeon: [name], MD;  Location: [facility] OR;  Service: Orthopedics;  Laterality: Right;   AORTIC VALVE REPLACEMENT N/A 08/22/2015   Procedure: AORTIC VALVE REPLACEMENT (AVR);  Surgeon: Kerin Perna, MD;  Location: [facility] OR;  Service: Open Heart Surgery;  Laterality: N/A;   ATRIAL FIBRILLATION ABLATION  01/30/2012   PVI by Dr. Johney Frame   ATRIAL FIBRILLATION ABLATION N/A 01/31/2012   Procedure: ATRIAL FIBRILLATION ABLATION;  Surgeon: [name], MD;  Location: [facility] CATH LAB;  Service: Cardiovascular;  Laterality: N/A;   ATRIAL FIBRILLATION ABLATION N/A 04/19/2020   Procedure: ATRIAL FIBRILLATION ABLATION;  Surgeon: [name], MD;  Location: [facility] INVASIVE CV LAB;  Service: Cardiovascular;  Laterality: N/A;   BACK SURGERY     X 3   BIOPSY  01/01/2022   Procedure: BIOPSY;  Surgeon: Kathi Der, MD;  Location: [facility] ENDOSCOPY;  Service: Gastroenterology;;   CARDIAC CATHETERIZATION N/A 08/09/2015   Procedure: Right/Left Heart Cath and Coronary Angiography;  Surgeon: Peter M Swaziland, MD;  Location: [facility] INVASIVE CV LAB;  Service: Cardiovascular;  Laterality: N/A;   CARDIAC CATHETERIZATION N/A  02/05/2016   Procedure: Right/Left Heart Cath and Coronary Angiography;  Surgeon: Corky Crafts, MD;  Location: [facility] INVASIVE CV LAB;  Service: Cardiovascular;  Laterality: N/A;   CARDIAC CATHETERIZATION N/A 04/17/2016   Procedure: Right Heart Cath;  Surgeon: Dolores Patty, MD;  Location: [facility] INVASIVE CV LAB;  Service: Cardiovascular;  Laterality: N/A;   CARDIOVASCULAR STRESS TEST  03/12/2002   EF 48%, NO EVIDENCE OF ISCHEMIA   CARDIOVERSION  01/2012; 03/31/2012   CARDIOVERSION N/A 02/25/2012   Procedure: CARDIOVERSION;  Surgeon: [name], MD;  Location: [facility] CATH LAB;  Service: Cardiovascular;  Laterality: N/A;   CARDIOVERSION N/A 03/31/2012   Procedure: CARDIOVERSION;   Surgeon: [name], MD;  Location: [facility] CATH LAB;  Service: Cardiovascular;  Laterality: N/A;   CARDIOVERSION N/A 11/23/2015   Procedure: CARDIOVERSION;  Surgeon: Laurey Morale, MD;  Location: [facility] ENDOSCOPY;  Service: Cardiovascular;  Laterality: N/A;   CARDIOVERSION N/A 04/08/2016   Procedure: CARDIOVERSION;  Surgeon: Dolores Patty, MD;  Location: [facility] ENDOSCOPY;  Service: Cardiovascular;  Laterality: N/A;   CARDIOVERSION N/A 09/14/2018   Procedure: CARDIOVERSION;  Surgeon: Wendall Stade, MD;  Location: [facility] ENDOSCOPY;  Service: Cardiovascular;  Laterality: N/A;   CARDIOVERSION N/A 10/19/2019   Procedure: CARDIOVERSION;  Surgeon: Dolores Patty, MD;  Location: [facility] ENDOSCOPY;  Service: Cardiovascular;  Laterality: N/A;   CARDIOVERSION N/A 02/25/2020   Procedure: CARDIOVERSION;  Surgeon: Pricilla Riffle, MD;  Location: [facility] ENDOSCOPY;  Service: Cardiovascular;  Laterality: N/A;   CARDIOVERSION N/A 05/25/2020   Procedure: CARDIOVERSION;  Surgeon: Lewayne Bunting, MD;  Location: [facility] ENDOSCOPY;  Service: Cardiovascular;  Laterality: N/A;   CARDIOVERSION N/A 07/10/2020   Procedure: CARDIOVERSION;  Surgeon: Jake Bathe, MD;  Location: [facility] ENDOSCOPY;  Service: Cardiovascular;  Laterality: N/A;   CLIPPING OF ATRIAL APPENDAGE N/A 08/22/2015   Procedure: CLIPPING OF ATRIAL APPENDAGE;  Surgeon: Kerin Perna, MD;  Location: [facility] OR;  Service: Open Heart Surgery;  Laterality: N/A;   COLONOSCOPY WITH PROPOFOL N/A 01/01/2022   Procedure: COLONOSCOPY WITH PROPOFOL;  Surgeon: Kathi Der, MD;  Location: [facility] ENDOSCOPY;  Service: Gastroenterology;  Laterality: N/A;   DOPPLER ECHOCARDIOGRAPHY  03/11/2002   EF 70-75%   FINGER SURGERY Left    Middle finger   FINGER TENDON REPAIR  1980's   "right little finger" (03/31/2012)   FLEXIBLE SIGMOIDOSCOPY N/A 12/30/2021   Procedure: FLEXIBLE SIGMOIDOSCOPY;  Surgeon: Charlott Rakes, MD;  Location: [facility] ENDOSCOPY;  Service: Gastroenterology;  Laterality: N/A;    INSERT / REPLACE / REMOVE PACEMAKER     St Jude   IR RADIOLOGIST EVAL & MGMT  05/06/2019   IR RADIOLOGIST EVAL & MGMT  10/06/2019   IR RADIOLOGIST EVAL & MGMT  10/31/2020   IR RADIOLOGIST EVAL & MGMT  01/18/2021   IR RADIOLOGIST EVAL & MGMT  03/08/2021   IR RADIOLOGIST EVAL & MGMT  06/13/2021   KIDNEY SURGERY Left    LEFT AND RIGHT HEART CATHETERIZATION WITH CORONARY ANGIOGRAM N/A 10/27/2012   Procedure: LEFT AND RIGHT HEART CATHETERIZATION WITH CORONARY ANGIOGRAM;  Surgeon: Pamella Pert, MD;  Location: [facility] CATH LAB;  Service: Cardiovascular;  Laterality: N/A;   LUMBAR DISC SURGERY  1980's X2;  2000's   MAZE N/A 08/22/2015   Procedure: MAZE;  Surgeon: Kerin Perna, MD;  Location: [facility] OR;  Service: Open Heart Surgery;  Laterality: N/A;   PACEMAKER INSERTION  03/31/2012   STJ Accent DR pacemaker implanted by Dr Johney Frame   PERMANENT PACEMAKER INSERTION N/A 03/31/2012  Procedure: PERMANENT PACEMAKER INSERTION;  Surgeon: [name], MD;  Location: [facility] CATH LAB;  Service: Cardiovascular;  Laterality: N/A;   PPM GENERATOR CHANGEOUT N/A 08/08/2020   Procedure: PPM GENERATOR CHANGEOUT;  Surgeon: [name], MD;  Location: [facility] INVASIVE CV LAB;  Service: Cardiovascular;  Laterality: N/A;   RADIOLOGY WITH ANESTHESIA Left 02/14/2021   Procedure: RADIOLOGY WITH ANESTHESIA LEFT RENAL CRYOABLATION;  Surgeon: Irish Lack, MD;  Location: [facility] ORS;  Service: Radiology;  Laterality: Left;   STUMP REVISION Right 10/09/2022   Procedure: REVISION RIGHT BELOW KNEE AMPUTATION;  Surgeon: [name], MD;  Location: [facility] OR;  Service: Orthopedics;  Laterality: Right;   STUMP REVISION Right 11/16/2022   Procedure: REVISION RIGHT BELOW KNEE AMPUTATION;  Surgeon: [name], MD;  Location: [facility] OR;  Service: Orthopedics;  Laterality: Right;   TEE WITHOUT CARDIOVERSION  01/30/2012   Procedure: TRANSESOPHAGEAL ECHOCARDIOGRAM (TEE);  Surgeon: Laurey Morale, MD;  Location: [facility] ENDOSCOPY;  Service:  Cardiovascular;  Laterality: N/A;  ablation next day   TEE WITHOUT CARDIOVERSION N/A 08/22/2015   Procedure: TRANSESOPHAGEAL ECHOCARDIOGRAM (TEE);  Surgeon: Kerin Perna, MD;  Location: [facility] OR;  Service: Open Heart Surgery;  Laterality: N/A;   TEE WITHOUT CARDIOVERSION N/A 10/19/2019   Procedure: TRANSESOPHAGEAL ECHOCARDIOGRAM (TEE);  Surgeon: Dolores Patty, MD;  Location: [facility] ENDOSCOPY;  Service: Cardiovascular;  Laterality: N/A;   US ECHOCARDIOGRAPHY  08/07/2009   EF 55-60%   Patient Active Problem List   Diagnosis Date Noted   AKI (acute kidney injury) (HCC) 12/22/2022   Fall 11/14/2022   Pain of right lower extremity due to injury 11/14/2022   Diabetic ulcer of lower leg (HCC) 11/13/2022   Osteomyelitis (HCC) 11/13/2022   Dyslipidemia 10/18/2022   Cellulitis of right lower extremity 10/17/2022   Dehiscence of amputation stump of right lower extremity (HCC) 10/09/2022   Below-knee amputation of right lower extremity (HCC) 10/09/2022   Gangrene of right foot (HCC) 09/04/2022   History of below-knee amputation of right lower extremity (HCC) 09/04/2022   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) 08/23/2022   CAP (community acquired pneumonia) 12/28/2021   Constipation 12/28/2021   Pain in both lower extremities 07/03/2021   Peripheral neuropathy 07/03/2021   Acute metabolic encephalopathy 06/09/2021   Marijuana dependence (HCC) 06/09/2021   DNR (do not resuscitate) 06/09/2021   Chest pain 06/08/2021   Renal lesion 02/14/2021   Status post cryoablation 02/14/2021   Allergic rhinitis 12/08/2020   Preop pulmonary/respiratory exam 03/27/2020   Acute on chronic diastolic (congestive) heart failure (HCC) 01/22/2020   Abdominal pain 01/22/2020   Hypothyroidism 12/13/2019   DM2 (diabetes mellitus, type 2) (HCC) 12/13/2019   Chronic kidney disease, stage 3b (HCC) 12/13/2019   Acute bacterial bronchitis 12/13/2019   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   Typical atrial flutter  (HCC)    Chronic respiratory failure with hypoxia (HCC) 05/23/2016   CAD (coronary artery disease) 05/23/2016   Persistent atrial fibrillation (HCC)    Atypical atrial flutter (HCC)    Visit for monitoring Tikosyn therapy 04/16/2016   Chronic diastolic CHF (congestive heart failure) (HCC) 02/03/2016   Cough 12/06/2015   Acute on chronic respiratory failure with hypoxia (HCC) 09/08/2015   Hallucinations 09/04/2015   Lymphadenopathy 09/04/2015   Ascending aortic aneurysm (HCC) 09/04/2015   Hypoxia 09/02/2015   S/P aortic valve replacement with bioprosthetic valve 09/02/2015   Pleural effusion 09/02/2015   S/P AVR 08/22/2015   GERD without esophagitis 09/20/2014   Intrinsic asthma 10/13/2013  Dyspnea 09/15/2013   Pulmonary hypertension (HCC) 09/15/2013   Shortness of breath 05/05/2013   Pacemaker-St.Jude 04/01/2012   Bradycardia 03/31/2012   Second degree AV block 03/31/2012   AV block, 1st degree 02/01/2012   PAF (paroxysmal atrial fibrillation) (HCC) 11/13/2011   Fatigue 11/13/2011   Essential hypertension 11/13/2011   Aortic stenosis 11/13/2011   Sleep apnea 11/13/2011    PCP: Alysia Penna, MD  REFERRING PROVIDER: Aldean Baker, MD  ONSET DATE: 04/01/2023 prosthesis delivery  REFERRING DIAG: Z89.511 (ICD-10-CM) - S/P BKA (below knee amputation), right   THERAPY DIAG:  Other abnormalities of gait and mobility  Unsteadiness on feet  Pain in right leg  Pain in left leg  Muscle weakness (generalized)  History of falling  Impaired functional mobility, balance, gait, and endurance  Rationale for Evaluation and Treatment: Rehabilitation  SUBJECTIVE:   SUBJECTIVE STATEMENT: Since last visit 2/4, had been admitted to ED (2/15) due covid and has had a fall with his wheelchair where he hit his head and has had some neck pain since. All imaging negative for fx. Injuries from fall- has some weeping of wound on LLE. Had visit with Dr. Lajoyce Corners and was cleared to resume PT  today. Verbalizes wearing of prosthesis 5-6hrs until the residual leg starts to hurt or his LLE is too fatigued/in pain to tolerate being up any longer. When he takes his liner off he is wiping it and his residual limb off, then says he elevates residual limb for a long period before putting the prosthesis on again.     PERTINENT HISTORY:  right TTA 09/04/22, DM2, HLD, asthma, COPD, A-Fib, CAD, pacemaker, cardioversion & catheterization, arthritis, Fibromyalgia, GERD, HTN,   PAIN:  Are you having pain? Yes: NPRS scale: today 5-6/10   Pain location: both legs Pain description: neuropathy, sharp, sticking "big" needles in leg Aggravating factors: meds wearing off,  standing & walking Relieving factors: pain pill 2x/day  Residual limb prosthesis on limb 0/10 - 10/10,  stand or walk 5/10 - 10/10  PRECAUTIONS: Fall and ICD/Pacemaker  WEIGHT BEARING RESTRICTIONS: No  FALLS: Has patient fallen in last 6 months? Yes. Number of falls 1 no injuries  LIVING ENVIRONMENT: Lives with: lives with their spouse and 3 dogs (mini schnauzer 15# indoor & 2 german [place] outdoor)  Lives in: [place] [facility] Access: Ramped entrance Home layout: One level Stairs: Yes: External: 6 steps; can reach both ramp at primary entrance Has following equipment at home: Single point cane, Environmental consultant - 2 wheeled, Wheelchair (manual), shower chair, Tour manager, and Ramped entry  OCCUPATION: retired from [employer]  PLOF: Independent, Independent with household mobility without device, and Independent with community mobility without device used oxygen as needed  PATIENT GOALS:  use prosthesis to function in community & in house, do odd repairs, yard work  OBJECTIVE:  COGNITION: Overall cognitive status: Within functional limits for tasks assessed   SENSATION: WFL  CARDIOVASCULAR RESPONSE: Functional activity: balance and gait assessments Pre-activity vitals: no oxygen seated in w/c HR: 67 SpO2: 93 Post-activity  vitals: Berg with no oxygen  HR: 78 SpO2: 90-91%  94% on oxygen at rest, HR 70 After gait with O2 SpO2 94%, HR 73  Modified Borg scale for dyspnea: 2: mild shortness of breath  POSTURE: rounded shoulders, forward head, flexed trunk , and weight shift left  LOWER EXTREMITY ROM:  ROM P:passive  A:active Right eval Left eval  Hip flexion    Hip extension standing -10*    Hip abduction  Hip adduction    Hip internal rotation    Hip external rotation    Knee flexion    Knee extension    Ankle dorsiflexion    Ankle plantarflexion    Ankle inversion    Ankle eversion     (Blank rows = not tested)  LOWER EXTREMITY MMT:  MMT Right eval Left eval  Hip flexion    Hip extension 3-4/5 3-4/5  Hip abduction 3-4/5 4/5  Hip adduction    Hip internal rotation    Hip external rotation    Knee flexion 3-4/5 4/5  Knee extension 3-4/5 4/5  Ankle dorsiflexion    Ankle plantarflexion    Ankle inversion    Ankle eversion    At Evaluation all strength testing is grossly seated and functionally standing / gait. (Blank rows = not tested)  TRANSFERS: Sit to stand: SBA from 20" w/c using BUEs on armrests to arise stabilizes with back of legs against w/c Stand to sit: SBA uses BUEs on armrests of w/c to control descent  FUNCTIONAL TESTs:  Berg Balance Scale: 19/56    GAIT: Gait pattern: step to pattern, decreased step length- Left, decreased stance time- Right, decreased hip/knee flexion- Right, decreased ankle dorsiflexion- Right, Right hip hike, knee flexed in stance- Right, lateral hip instability, trunk flexed, and abducted- Right Distance walked: 25' Assistive device utilized: Environmental consultant - 2 wheeled and TTA prosthesis Level of assistance: Min A balance loss to left on straight path & 180* turn Comments: used O2 with portable unit slung over left shoulder,  pain in residual limb limited distance,   CURRENT PROSTHETIC WEAR ASSESSMENT: 06/03/2023:  Patient is dependent with:  skin check, residual limb care, care of non-amputated limb, prosthetic cleaning, ply sock cleaning, correct ply sock adjustment, proper wear schedule/adjustment, and proper weight-bearing schedule/adjustment Donning prosthesis: dependent with PT 100% cues on technique Doffing prosthesis: reports modified independent Prosthetic wear tolerance: 2-4 hours, 1x/day, ~3 days/week Prosthetic weight bearing tolerance: 5 minutes with partial weight on prosthesis with pain increasing to 8/10 (intolerable) Edema: pitting with 7 sec capillary refill Residual limb condition: dry skin especially distal limb, 3 scratches with superficial scab on distal limb, scar healed but adhered over entire area of distal tibia, bulbous shape,  Prosthetic description: silicon liner with pin lock suspension, total contact socket with flexible inner socket, dynamic response foot K code/activity level with prosthetic use: Level 3    TODAY'S TREATMENT:                                                                                                                             DATE:  06/23/2023: Prosthetic Care with TTA prosthesis: -Patient ambuated CGA with RW 39ft + 39ft with seated rest break and step (curb) / ramp negotiation between. VC for looking up when ambulating and proper positioning and sequencing of RW push. CGA for stand>sit 18in chair w/o armrest with patient using RW heavily with semicontrolled descent. Sit>stand required 2  attempts with patient attempting to use BUE on walker to rise. VC and explanation on proper sequencing for correction of transfer. Patient verbalizing understanding. -6in curb minA / CGA with VC for proper placement of walker. When descending pt had instance of R knee buckling before attempting to lower walker, also demonstrated increased posterior lean with patient able to self correct with vc -ramp x2 bouts; 1st bout: ascending- CGA with heavy VC for sequencing of walker and positioning within  walker as well as smaller steps. Descending- vc for proper placement of UE on walker, positioning within walker, and sequencing of walker. 2nd bout: CGA with VC for progressing walker, patient able to step appropriately. Desceding- patient had 1 instance of R knee buckling requiring minA for prevention of fall, vc for proper distance between self and walker for safety and to catch himself if knee buckles.  -PT educated pt & wife in target to wear prosthesis all awake hours.  PT recommended 3-4 hours 2x/day to improve overall wear but have a break between for limiting residual limb discomfort. Both verbalized understanding.    Patient/wife given tennis balls for his own RW to assist with ambulation and ease of pushing walker.    TREATMENT:                                                                                                                             DATE:  06/03/2023: Prosthetic Care with TTA prosthesis: See patient education below.  PATIENT EDUCATION: PATIENT EDUCATED ON FOLLOWING PROSTHETIC CARE: Education details:  Skin check, Residual limb care, Prosthetic cleaning, Correct ply sock adjustment, Propper donning, and Proper wear schedule/adjustment Prosthetic wear tolerance: 2 hours 2x/day, 7 days/week Person educated: Patient and Spouse Education method: Explanation, Demonstration, Tactile cues, and Verbal cues Education comprehension: verbalized understanding, verbal cues required, tactile cues required, and needs further education  HOME EXERCISE PROGRAM:  ASSESSMENT:  CLINICAL IMPRESSION: Despite fall outiside of clinic, patient continues to make progress towards goals in curb and ramp negotiation. Patient did have increased fatigue and BLE pain requiring seated rest break. Patient demonstrates some impulsivity and decreased safety awareness as seen during sit>stand transfer however verbalized understanding with education on proper and safe transfer sequencing. Patient will  continue to benefit from continued skilled physical therapy to address deficits for safe return to community with increased independence.   OBJECTIVE IMPAIRMENTS: Abnormal gait, cardiopulmonary status limiting activity, decreased activity tolerance, decreased balance, decreased endurance, decreased knowledge of condition, decreased knowledge of use of DME, decreased mobility, difficulty walking, decreased ROM, decreased strength, increased edema, prosthetic dependency , and pain.   ACTIVITY LIMITATIONS: carrying, lifting, bending, standing, stairs, transfers, and locomotion level  PARTICIPATION LIMITATIONS: meal prep, cleaning, driving, community activity, and yard work  PERSONAL FACTORS: Fitness, Time since onset of injury/illness/exacerbation, and 3+ comorbidities: see PMH  are also affecting patient's functional outcome.   REHAB POTENTIAL: Good  CLINICAL DECISION MAKING: Evolving/moderate complexity  EVALUATION COMPLEXITY: Moderate   GOALS:  Goals reviewed with patient? Yes  SHORT TERM GOALS: Target date: 07/02/2023  Patient donnes prosthesis modified independent & verbalizes proper cleaning. Baseline: SEE OBJECTIVE DATA Goal status: on going 06/23/2023 2.  Patient tolerates prosthesis >6 hrs total /day without skin issues or limb pain >5/10 after standing. Baseline: SEE OBJECTIVE DATA Goal status: on going 06/23/2023  3.  Patient able to reach 5" and looks to sides without UE support with supervision. Baseline: SEE OBJECTIVE DATA Goal status: on going 06/23/2023  4. Patient ambulates 17' with RW & prosthesis with supervision. Baseline: SEE OBJECTIVE DATA Goal status: on going 06/23/2023  5. Patient negotiates ramps & curbs with RW & prosthesis with minA. Baseline: SEE OBJECTIVE DATA Goal status: MET 06/23/2023  LONG TERM GOALS: Target date: 08/28/2023  Patient demonstrates & verbalized understanding of prosthetic care to enable safe utilization of prosthesis. Baseline: SEE  OBJECTIVE DATA Goal status: MET 06/23/2023  Patient tolerates prosthesis wear >90% of awake hours without skin or limb pain issues. Baseline: SEE OBJECTIVE DATA Goal status: on going 06/23/2023  Berg Balance >36/56 to indicate lower fall risk Baseline: SEE OBJECTIVE DATA Goal status: on going 06/23/2023  Patient ambulates >300' with prosthesis & LRAD independently Baseline: SEE OBJECTIVE DATA Goal status: on going 06/23/2023  Patient negotiates ramps, curbs & stairs with single rail with prosthesis & LRAD independently. Baseline: SEE OBJECTIVE DATA Goal status: on going 06/23/2023   PLAN:  PT FREQUENCY: 2x/week  PT DURATION: 12 weeks  PLANNED INTERVENTIONS: 97164- PT Re-evaluation, 97110-Therapeutic exercises, 97530- Therapeutic activity, 97112- Neuromuscular re-education, [phone]- Self Care, 19147- Gait training, [phone]- Prosthetic training, Patient/Family education, Balance training, Stair training, Scar mobilization, Vestibular training, DME instructions, and Physical Performance Testing  PLAN FOR NEXT SESSION:  Hep at sink, STS with RW safety, continue ambulation, curb, and ramp. Activity tolerance, explanation of short/med/long walks, see what his max distance is.    Date of referral: 04/17/2023  PT eval delayed by COPD exacerbation Referring provider: Aldean Baker, MD Referring diagnosis? Z89.511 (ICD-10-CM) - S/P BKA (below knee amputation), right Treatment diagnosis? (if different than referring diagnosis)   Other abnormalities of gait & mobility R26.89 Unsteadiness on feet R26.81 Pain in left leg M79.605 Pain in right leg M79.604 Muscle Weakness (generalized) M62.81 History of falling Z91.81 Impaired functional mobility, balance, gait and endurance  What was this (referring dx) caused by? Surgery (Type: right BKA)  Nature of Condition: Initial Onset (within last 3 months)  prosthesis delivery 04/01/2023   Laterality: Rt  Current Functional Measure Score: Other Berg  Balance 19/56  Objective measurements identify impairments when they are compared to normal values, the uninvolved extremity, and prior level of function.  [x]  Yes  []  No  Objective assessment of functional ability: Severe functional limitations   Briefly describe symptoms: dependent in care & use of prosthesis, impaired balance and gait, pain in both legs  How did symptoms start: amputation from wounds  Average pain intensity:  Last 24 hours: 8/10  Past week: 8/10  How often does the pt experience symptoms? Constantly  How much have the symptoms interfered with usual daily activities? Extremely  How has condition changed since care began at this facility? NA - initial visit  In general, how is the patients overall health? Good   BACK PAIN (STarT Back Screening Tool) No   Duaa Stelzner, Paisyn Guercio, Student-PT 06/23/2023, 11:56 AM   This entire session of physical therapy was performed under the direct supervision of PT signing evaluation /treatment. PT reviewed note and agrees.  Vladimir Faster, PT, DPT 06/23/2023, 1:08 PM

## 2023-06-24 ENCOUNTER — Encounter: Payer: Self-pay | Admitting: Orthopedic Surgery

## 2023-06-24 NOTE — Progress Notes (Signed)
 Office Visit Note   Patient: [name]           Date of Birth: [birth year]           MRN: [id number] Visit Date: 06/23/2023              Requested by: Alysia Penna, MD [address] [place],  [place] [address] PCP: Alysia Penna, MD  Chief Complaint  Patient presents with   Left Leg - Wound Check      HPI: Patient is a 73 year old gentleman who presents with 2 separate issues.  #1 status post right transtibial amputation.  #2 weeping edema left leg with venous insufficiency.  Patient states that he has had an ulcer for about a week.  He is physical therapy is on hold.  Assessment & Plan: Visit Diagnoses:  1. S/P BKA (below knee amputation), right (HCC)   2. Chronic venous hypertension (idiopathic) with ulcer and inflammation of left lower extremity (HCC)     Plan: Recommended a size large knee-high compression stocking to be worn around-the-clock and change daily.  Follow-Up Instructions: Return in about 4 weeks (around 07/21/2023).   Ortho Exam  Patient is alert, oriented, no adenopathy, well-dressed, normal affect, normal respiratory effort. Examination patient has a stable right transtibial amputation.  Examination of the left leg he has weeping edema secondary to venous insufficiency.  There is an ulcer that is 1 cm in diameter it is superficial there is no cellulitis.  Calf measures 37 cm in circumference.  Imaging: No results found. No images are attached to the encounter.  Labs: Lab Results  Component Value Date   HGBA1C 6.3 (H) 11/13/2022   HGBA1C 6.5 (H) 12/27/2021   HGBA1C 6.6 (H) 06/11/2021   ESRSEDRATE 60 (H) 09/04/2015   REPTSTATUS 10/22/2022 FINAL 10/17/2022   GRAMSTAIN NO WBC SEEN NO ORGANISMS SEEN  10/09/2022   CULT  10/17/2022    NO GROWTH 5 DAYS Performed at [facility], [address], [place], [place] [address]    [facility] STAPHYLOCOCCUS AUREUS 10/09/2022   LABORGA STAPHYLOCOCCUS EPIDERMIDIS 10/09/2022     Lab Results   Component Value Date   ALBUMIN 3.4 (L) 04/15/2023   ALBUMIN 3.9 02/25/2023   ALBUMIN 3.5 01/31/2023   PREALBUMIN 25 09/04/2022    Lab Results  Component Value Date   MG 2.4 12/23/2022   MG 2.0 06/11/2021   MG 2.3 12/10/2020   No results found for: "VD25OH"  Lab Results  Component Value Date   PREALBUMIN 25 09/04/2022      Latest Ref Rng & Units 04/15/2023   11:15 AM 02/25/2023   10:05 AM 01/31/2023    8:50 AM  CBC EXTENDED  WBC 4.0 - 10.5 K/uL 7.1  9.8    9.6  10.4   RBC 4.22 - 5.81 MIL/uL 4.35  4.84    4.87  4.40   Hemoglobin 13.0 - 17.0 g/dL 9.6  82.9    56.2  13.0   HCT 39.0 - 52.0 % 34.2  38.5    38.6  35.6   Platelets 150 - 400 K/uL 175  206    207  208   NEUT# 1.7 - 7.7 K/uL 5.4  7.9    Lymph# 0.7 - 4.0 K/uL 0.8  0.8       There is no height or weight on file to calculate BMI.  Orders:  No orders of the defined types were placed in this encounter.  No orders of the defined  types were placed in this encounter.    Procedures: No procedures performed  Clinical Data: No additional findings.  ROS:  All other systems negative, except as noted in the HPI. Review of Systems  Objective: Vital Signs: There were no vitals taken for this visit.  Specialty Comments:  No specialty comments available.  PMFS History: Patient Active Problem List   Diagnosis Date Noted   AKI (acute kidney injury) (HCC) 12/22/2022   Fall 11/14/2022   Pain of right lower extremity due to injury 11/14/2022   Diabetic ulcer of lower leg (HCC) 11/13/2022   Osteomyelitis (HCC) 11/13/2022   Dyslipidemia 10/18/2022   Cellulitis of right lower extremity 10/17/2022   Dehiscence of amputation stump of right lower extremity (HCC) 10/09/2022   Below-knee amputation of right lower extremity (HCC) 10/09/2022   Gangrene of right foot (HCC) 09/04/2022   History of below-knee amputation of right lower extremity (HCC) 09/04/2022   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)  08/23/2022   CAP (community acquired pneumonia) 12/28/2021   Constipation 12/28/2021   Pain in both lower extremities 07/03/2021   Peripheral neuropathy 07/03/2021   Acute metabolic encephalopathy 06/09/2021   Marijuana dependence (HCC) 06/09/2021   DNR (do not resuscitate) 06/09/2021   Chest pain 06/08/2021   Renal lesion 02/14/2021   Status post cryoablation 02/14/2021   Allergic rhinitis 12/08/2020   Preop pulmonary/respiratory exam 03/27/2020   Acute on chronic diastolic (congestive) heart failure (HCC) 01/22/2020   Abdominal pain 01/22/2020   Hypothyroidism 12/13/2019   DM2 (diabetes mellitus, type 2) (HCC) 12/13/2019   Chronic kidney disease, stage 3b (HCC) 12/13/2019   Acute bacterial bronchitis 12/13/2019   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   Typical atrial flutter (HCC)    Chronic respiratory failure with hypoxia (HCC) 05/23/2016   CAD (coronary artery disease) 05/23/2016   Persistent atrial fibrillation (HCC)    Atypical atrial flutter (HCC)    Visit for monitoring Tikosyn therapy 04/16/2016   Chronic diastolic CHF (congestive heart failure) (HCC) 02/03/2016   Cough 12/06/2015   Acute on chronic respiratory failure with hypoxia (HCC) 09/08/2015   Hallucinations 09/04/2015   Lymphadenopathy 09/04/2015   Ascending aortic aneurysm (HCC) 09/04/2015   Hypoxia 09/02/2015   S/P aortic valve replacement with bioprosthetic valve 09/02/2015   Pleural effusion 09/02/2015   S/P AVR 08/22/2015   GERD without esophagitis 09/20/2014   Intrinsic asthma 10/13/2013   Dyspnea 09/15/2013   Pulmonary hypertension (HCC) 09/15/2013   Shortness of breath 05/05/2013   Pacemaker-St.Jude 04/01/2012   Bradycardia 03/31/2012   Second degree AV block 03/31/2012   AV block, 1st degree 02/01/2012   PAF (paroxysmal atrial fibrillation) (HCC) 11/13/2011   Fatigue 11/13/2011   Essential hypertension 11/13/2011   Aortic stenosis 11/13/2011   Sleep apnea 11/13/2011   Past Medical History:   Diagnosis Date   Aortic stenosis, mild    Arthritis of hand    "just a little bit in both hands" (03/31/2012)   Asthma    "little bit" (03/31/2012)   Atrial fibrillation (HCC)    dx '04; DCCV '04, placed on flecainide, failed DCCV 04/2010, flecainide stopped; s/p successful A.fib ablation 01/31/12   CHF (congestive heart failure) (HCC)    Controlled type 2 diabetes mellitus without complication, without long-term current use of insulin (HCC) 12/13/2019   COPD (chronic obstructive pulmonary disease) (HCC)    DJD (degenerative joint disease)    Fatty liver    Fibromyalgia    GERD (gastroesophageal reflux disease)  Hyperlipidemia    Hypertension    Hypothyroidism    S/P radiation   Left atrial enlargement    LA size 45mm by echo 11/21/11   Mitral regurgitation    trivial   Obstructive sleep apnea    mild by sleep study 2013; pt stated he does not have a machine because "it wasn't bad enough for him to have one"   Pacemaker 03/31/2012   Pneumonia    last time 2023   Restless leg syndrome    Second degree AV block    Splenomegaly     Family History  Problem Relation Age of Onset   Heart failure Mother    Stroke Father    Neuropathy Neg Hx     Past Surgical History:  Procedure Laterality Date   AMPUTATION Right 08/23/2022   Procedure: RIGHT FOOT FIFTH RAY AMPUTATION;  Surgeon: [name], MD;  Location: [facility] OR;  Service: Orthopedics;  Laterality: Right;   AMPUTATION Right 09/04/2022   Procedure: RIGHT BELOW KNEE AMPUTATION;  Surgeon: [name], MD;  Location[facility] OR;  Service: Orthopedics;  Laterality: Right;   AORTIC VALVE REPLACEMENT N/A 08/22/2015   Procedure: AORTIC VALVE REPLACEMENT (AVR);  Surgeon: Kerin Perna, MD;  Location: [facility] OR;  Service: Open Heart Surgery;  Laterality: N/A;   ATRIAL FIBRILLATION ABLATION  01/30/2012   PVI by Dr. Johney Frame   ATRIAL FIBRILLATION ABLATION N/A 01/31/2012   Procedure: ATRIAL FIBRILLATION ABLATION;  Surgeon: [name], MD;   Location: [facility] CATH LAB;  Service: Cardiovascular;  Laterality: N/A;   ATRIAL FIBRILLATION ABLATION N/A 04/19/2020   Procedure: ATRIAL FIBRILLATION ABLATION;  Surgeon: [name], MD;  Location: [facility] INVASIVE CV LAB;  Service: Cardiovascular;  Laterality: N/A;   BACK SURGERY     X 3   BIOPSY  01/01/2022   Procedure: BIOPSY;  Surgeon: Kathi Der, MD;  Location: [facility] ENDOSCOPY;  Service: Gastroenterology;;   CARDIAC CATHETERIZATION N/A 08/09/2015   Procedure: Right/Left Heart Cath and Coronary Angiography;  Surgeon: Peter M Swaziland, MD;  Location: [facility] INVASIVE CV LAB;  Service: Cardiovascular;  Laterality: N/A;   CARDIAC CATHETERIZATION N/A 02/05/2016   Procedure: Right/Left Heart Cath and Coronary Angiography;  Surgeon: Corky Crafts, MD;  Location: [facility] INVASIVE CV LAB;  Service: Cardiovascular;  Laterality: N/A;   CARDIAC CATHETERIZATION N/A 04/17/2016   Procedure: Right Heart Cath;  Surgeon: Dolores Patty, MD;  Location: [facility] INVASIVE CV LAB;  Service: Cardiovascular;  Laterality: N/A;   CARDIOVASCULAR STRESS TEST  03/12/2002   EF 48%, NO EVIDENCE OF ISCHEMIA   CARDIOVERSION  01/2012; 03/31/2012   CARDIOVERSION N/A 02/25/2012   Procedure: CARDIOVERSION;  Surgeon: [name], MD;  Location: [facility] CATH LAB;  Service: Cardiovascular;  Laterality: N/A;   CARDIOVERSION N/A 03/31/2012   Procedure: CARDIOVERSION;  Surgeon: [name], MD;  Location: [facility] CATH LAB;  Service: Cardiovascular;  Laterality: N/A;   CARDIOVERSION N/A 11/23/2015   Procedure: CARDIOVERSION;  Surgeon: Laurey Morale, MD;  Location: [facility] ENDOSCOPY;  Service: Cardiovascular;  Laterality: N/A;   CARDIOVERSION N/A 04/08/2016   Procedure: CARDIOVERSION;  Surgeon: Dolores Patty, MD;  Location: [facility] ENDOSCOPY;  Service: Cardiovascular;  Laterality: N/A;   CARDIOVERSION N/A 09/14/2018   Procedure: CARDIOVERSION;  Surgeon: Wendall Stade, MD;  Location: [facility] ENDOSCOPY;  Service: Cardiovascular;  Laterality: N/A;    CARDIOVERSION N/A 10/19/2019   Procedure: CARDIOVERSION;  Surgeon: Dolores Patty, MD;  Location: [facility] ENDOSCOPY;  Service: Cardiovascular;  Laterality: N/A;   CARDIOVERSION N/A  02/25/2020   Procedure: CARDIOVERSION;  Surgeon: Pricilla Riffle, MD;  Location: [facility] ENDOSCOPY;  Service: Cardiovascular;  Laterality: N/A;   CARDIOVERSION N/A 05/25/2020   Procedure: CARDIOVERSION;  Surgeon: Lewayne Bunting, MD;  Location: [facility] ENDOSCOPY;  Service: Cardiovascular;  Laterality: N/A;   CARDIOVERSION N/A 07/10/2020   Procedure: CARDIOVERSION;  Surgeon: Jake Bathe, MD;  Location: [facility] ENDOSCOPY;  Service: Cardiovascular;  Laterality: N/A;   CLIPPING OF ATRIAL APPENDAGE N/A 08/22/2015   Procedure: CLIPPING OF ATRIAL APPENDAGE;  Surgeon: Kerin Perna, MD;  Location: [facility] OR;  Service: Open Heart Surgery;  Laterality: N/A;   COLONOSCOPY WITH PROPOFOL N/A 01/01/2022   Procedure: COLONOSCOPY WITH PROPOFOL;  Surgeon: Kathi Der, MD;  Location: [facility] ENDOSCOPY;  Service: Gastroenterology;  Laterality: N/A;   DOPPLER ECHOCARDIOGRAPHY  03/11/2002   EF 70-75%   FINGER SURGERY Left    Middle finger   FINGER TENDON REPAIR  1980's   "right little finger" (03/31/2012)   FLEXIBLE SIGMOIDOSCOPY N/A 12/30/2021   Procedure: FLEXIBLE SIGMOIDOSCOPY;  Surgeon: Charlott Rakes, MD;  Location: [facility] ENDOSCOPY;  Service: Gastroenterology;  Laterality: N/A;   INSERT / REPLACE / REMOVE PACEMAKER     St Jude   IR RADIOLOGIST EVAL & MGMT  05/06/2019   IR RADIOLOGIST EVAL & MGMT  10/06/2019   IR RADIOLOGIST EVAL & MGMT  10/31/2020   IR RADIOLOGIST EVAL & MGMT  01/18/2021   IR RADIOLOGIST EVAL & MGMT  03/08/2021   IR RADIOLOGIST EVAL & MGMT  06/13/2021   KIDNEY SURGERY Left    LEFT AND RIGHT HEART CATHETERIZATION WITH CORONARY ANGIOGRAM N/A 10/27/2012   Procedure: LEFT AND RIGHT HEART CATHETERIZATION WITH CORONARY ANGIOGRAM;  Surgeon: Pamella Pert, MD;  Location: [facility] CATH LAB;  Service: Cardiovascular;  Laterality: N/A;    LUMBAR DISC SURGERY  1980's X2;  2000's   MAZE N/A 08/22/2015   Procedure: MAZE;  Surgeon: Kerin Perna, MD;  Location: [facility] OR;  Service: Open Heart Surgery;  Laterality: N/A;   PACEMAKER INSERTION  03/31/2012   STJ Accent DR pacemaker implanted by Dr Johney Frame   PERMANENT PACEMAKER INSERTION N/A 03/31/2012   Procedure: PERMANENT PACEMAKER INSERTION;  Surgeon: [name], MD;  Location: [facility] CATH LAB;  Service: Cardiovascular;  Laterality: N/A;   PPM GENERATOR CHANGEOUT N/A 08/08/2020   Procedure: PPM GENERATOR CHANGEOUT;  Surgeon: [name], MD;  Location: [facility] INVASIVE CV LAB;  Service: Cardiovascular;  Laterality: N/A;   RADIOLOGY WITH ANESTHESIA Left 02/14/2021   Procedure: RADIOLOGY WITH ANESTHESIA LEFT RENAL CRYOABLATION;  Surgeon: Irish Lack, MD;  Location: [facility] ORS;  Service: Radiology;  Laterality: Left;   STUMP REVISION Right 10/09/2022   Procedure: REVISION RIGHT BELOW KNEE AMPUTATION;  Surgeon: [name], MD;  Location: [facility] OR;  Service: Orthopedics;  Laterality: Right;   STUMP REVISION Right 11/16/2022   Procedure: REVISION RIGHT BELOW KNEE AMPUTATION;  Surgeon: [name], MD;  Location: [facility] OR;  Service: Orthopedics;  Laterality: Right;   TEE WITHOUT CARDIOVERSION  01/30/2012   Procedure: TRANSESOPHAGEAL ECHOCARDIOGRAM (TEE);  Surgeon: Laurey Morale, MD;  Location: [facility] ENDOSCOPY;  Service: Cardiovascular;  Laterality: N/A;  ablation next day   TEE WITHOUT CARDIOVERSION N/A 08/22/2015   Procedure: TRANSESOPHAGEAL ECHOCARDIOGRAM (TEE);  Surgeon: Kerin Perna, MD;  Location: [facility] OR;  Service: Open Heart Surgery;  Laterality: N/A;   TEE WITHOUT CARDIOVERSION N/A 10/19/2019   Procedure: TRANSESOPHAGEAL ECHOCARDIOGRAM (TEE);  Surgeon: Dolores Patty, MD;  Location: [facility] ENDOSCOPY;  Service: Cardiovascular;  Laterality: N/A;   US ECHOCARDIOGRAPHY  08/07/2009   EF 55-60%   Social History   Occupational History   Occupation: retired  Tobacco Use   Smoking status:  Former    Current packs/day: 0.00    Average packs/day: 1 pack/day for 30.2 years (30.2 ttl pk-yrs)    Types: Cigarettes    Start date: 1963    Quit date: 07/18/1991    Years since quitting: 31.9    Passive exposure: Never   Smokeless tobacco: Never  Vaping Use   Vaping status: Never Used  Substance and Sexual Activity   Alcohol use: Not Currently    Comment: occasional   Drug use: Yes    Types: Marijuana    Comment: Marijuana- for nausea   Sexual activity: Not Currently

## 2023-06-25 ENCOUNTER — Encounter: Payer: Self-pay | Admitting: Physical Therapy

## 2023-06-25 ENCOUNTER — Ambulatory Visit: Payer: Medicare Other | Admitting: Physical Therapy

## 2023-06-25 DIAGNOSIS — R2689 Other abnormalities of gait and mobility: Secondary | ICD-10-CM

## 2023-06-25 DIAGNOSIS — M79605 Pain in left leg: Secondary | ICD-10-CM

## 2023-06-25 DIAGNOSIS — R2681 Unsteadiness on feet: Secondary | ICD-10-CM | POA: Diagnosis not present

## 2023-06-25 DIAGNOSIS — M6281 Muscle weakness (generalized): Secondary | ICD-10-CM

## 2023-06-25 DIAGNOSIS — M79604 Pain in right leg: Secondary | ICD-10-CM | POA: Diagnosis not present

## 2023-06-25 NOTE — Patient Instructions (Signed)
 Do each exercise 1-2  times per day Do each exercise 5-10 repetitions Hold each exercise for 2 seconds to feel your location  AT SINK FIND YOUR MIDLINE POSITION AND PLACE FEET EQUAL DISTANCE FROM THE MIDLINE.  Try to find this position when standing still for activities.   USE TAPE ON FLOOR TO MARK THE MIDLINE POSITION which is even with middle of sink.  You also should try to feel with your limb pressure in socket.  You are trying to feel with limb what you used to feel with the bottom of your foot.  Side to Side Shift: Moving your hips only (not shoulders): move weight onto your left leg, HOLD/FEEL pressure in socket.  Move back to equal weight on each leg, HOLD/FEEL pressure in socket. Move weight onto your right leg, HOLD/FEEL pressure in socket. Move back to equal weight on each leg, HOLD/FEEL pressure in socket. Repeat.  Start with both hands on sink, progress to hand on prosthetic side only, then no hands.  Front to Back Shift: Moving your hips only (not shoulders): move your weight forward onto your toes, HOLD/FEEL pressure in socket. Move your weight back to equal Flat Foot on both legs, HOLD/FEEL  pressure in socket. Move your weight back onto your heels, HOLD/FEEL  pressure in socket. Move your weight back to equal on both legs, HOLD/FEEL  pressure in socket. Repeat.  Start with both hands on sink, progress to hand on prosthetic side only, then no hands.  Moving Cones / Cups: With equal weight on each leg: Hold on with one hand the first time, then progress to no hand supports. Move cups from one side of sink to the other. Place cups ~2" out of your reach, progress to 10" beyond reach.  Place one hand in middle of sink and reach with other hand. Do both arms.  Then hover one hand and move cups with other hand.  Overhead/Upward Reaching: alternated reaching up to top cabinets or ceiling if no cabinets present. Keep equal weight on each leg. Start with one hand support on counter while other  hand reaches and progress to no hand support with reaching.  ace one hand in middle of sink and reach with other hand. Do both arms.  Then hover one hand and move cups with other hand.  5.   Looking Over Shoulders: With equal weight on each leg: alternate turning to look over your shoulders with one hand support on counter as needed.  Start with head motions only to look in front of shoulder, then even with shoulder and progress to looking behind you. To look to side, move head /eyes, then shoulder on side looking pulls back, shift more weight to side looking and pull hip back. Place one hand in middle of sink and let go with other hand so your shoulder can pull back. Switch hands to look other way.   Then hover one hand and look over shoulder. If looking right, use left hand at sink. If looking left, use right hand at sink. 6.  Stepping with leg that is not amputated:  Move items under cabinet out of your way. Shift your hips/pelvis so weight on prosthesis. Tighten muscles in hip on prosthetic side.  SLOWLY step other leg so front of foot is in cabinet. Then step back to floor.

## 2023-06-25 NOTE — Therapy (Signed)
 OUTPATIENT PHYSICAL THERAPY PROSTHETIC TREATMENT   Patient Name: [name] MRN: [id number] DOB:March 09, 1951, 73 y.o., male Today's Date: 06/25/2023  END OF SESSION:  PT End of Session - 06/25/23 1047     Visit Number 3    Number of Visits 25    Date for PT Re-Evaluation 08/28/23    Authorization Type [employer] Medicare    Progress Note Due on Visit 10    PT Start Time 1045    PT Stop Time 1125    PT Time Calculation (min) 40 min    Equipment Utilized During Treatment Gait belt    Activity Tolerance Patient tolerated treatment well;Patient limited by pain;Patient limited by fatigue    Behavior During Therapy [facility] for tasks assessed/performed              Past Medical History:  Diagnosis Date   Aortic stenosis, mild    Arthritis of hand    "just a little bit in both hands" (03/31/2012)   Asthma    "little bit" (03/31/2012)   Atrial fibrillation (HCC)    dx '04; DCCV '04, placed on flecainide, failed DCCV 04/2010, flecainide stopped; s/p successful A.fib ablation 01/31/12   CHF (congestive heart failure) (HCC)    Controlled type 2 diabetes mellitus without complication, without long-term current use of insulin (HCC) 12/13/2019   COPD (chronic obstructive pulmonary disease) (HCC)    DJD (degenerative joint disease)    Fatty liver    Fibromyalgia    GERD (gastroesophageal reflux disease)    Hyperlipidemia    Hypertension    Hypothyroidism    S/P radiation   Left atrial enlargement    LA size 45mm by echo 11/21/11   Mitral regurgitation    trivial   Obstructive sleep apnea    mild by sleep study 2013; pt stated he does not have a machine because "it wasn't bad enough for him to have one"   Pacemaker 03/31/2012   Pneumonia    last time 2023   Restless leg syndrome    Second degree AV block    Splenomegaly    Past Surgical History:  Procedure Laterality Date   AMPUTATION Right 08/23/2022   Procedure: RIGHT FOOT FIFTH RAY AMPUTATION;  Surgeon: [name], MD;   Location: [facility] OR;  Service: Orthopedics;  Laterality: Right;   AMPUTATION Right 09/04/2022   Procedure: RIGHT BELOW KNEE AMPUTATION;  Surgeon: [name], MD;  Location: [facility] OR;  Service: Orthopedics;  Laterality: Right;   AORTIC VALVE REPLACEMENT N/A 08/22/2015   Procedure: AORTIC VALVE REPLACEMENT (AVR);  Surgeon: Kerin Perna, MD;  Location: [facility] OR;  Service: Open Heart Surgery;  Laterality: N/A;   ATRIAL FIBRILLATION ABLATION  01/30/2012   PVI by Dr. Johney Frame   ATRIAL FIBRILLATION ABLATION N/A 01/31/2012   Procedure: ATRIAL FIBRILLATION ABLATION;  Surgeon: [name], MD;  Location: [facility] CATH LAB;  Service: Cardiovascular;  Laterality: N/A;   ATRIAL FIBRILLATION ABLATION N/A 04/19/2020   Procedure: ATRIAL FIBRILLATION ABLATION;  Surgeon: [name], MD;  Location: [facility] INVASIVE CV LAB;  Service: Cardiovascular;  Laterality: N/A;   BACK SURGERY     X 3   BIOPSY  01/01/2022   Procedure: BIOPSY;  Surgeon: Kathi Der, MD;  Location: [facility] ENDOSCOPY;  Service: Gastroenterology;;   CARDIAC CATHETERIZATION N/A 08/09/2015   Procedure: Right/Left Heart Cath and Coronary Angiography;  Surgeon: Peter M Swaziland, MD;  Location: [facility] INVASIVE CV LAB;  Service: Cardiovascular;  Laterality: N/A;   CARDIAC CATHETERIZATION N/A  02/05/2016   Procedure: Right/Left Heart Cath and Coronary Angiography;  Surgeon: Corky Crafts, MD;  Location: [facility] INVASIVE CV LAB;  Service: Cardiovascular;  Laterality: N/A;   CARDIAC CATHETERIZATION N/A 04/17/2016   Procedure: Right Heart Cath;  Surgeon: Dolores Patty, MD;  Location: [facility] INVASIVE CV LAB;  Service: Cardiovascular;  Laterality: N/A;   CARDIOVASCULAR STRESS TEST  03/12/2002   EF 48%, NO EVIDENCE OF ISCHEMIA   CARDIOVERSION  01/2012; 03/31/2012   CARDIOVERSION N/A 02/25/2012   Procedure: CARDIOVERSION;  Surgeon: [name], MD;  Location: [facility] CATH LAB;  Service: Cardiovascular;  Laterality: N/A;   CARDIOVERSION N/A 03/31/2012   Procedure: CARDIOVERSION;   Surgeon: [name], MD;  Location: [facility] CATH LAB;  Service: Cardiovascular;  Laterality: N/A;   CARDIOVERSION N/A 11/23/2015   Procedure: CARDIOVERSION;  Surgeon: Laurey Morale, MD;  Location: [facility] ENDOSCOPY;  Service: Cardiovascular;  Laterality: N/A;   CARDIOVERSION N/A 04/08/2016   Procedure: CARDIOVERSION;  Surgeon: Dolores Patty, MD;  Location: [facility] ENDOSCOPY;  Service: Cardiovascular;  Laterality: N/A;   CARDIOVERSION N/A 09/14/2018   Procedure: CARDIOVERSION;  Surgeon: Wendall Stade, MD;  Location: [facility] ENDOSCOPY;  Service: Cardiovascular;  Laterality: N/A;   CARDIOVERSION N/A 10/19/2019   Procedure: CARDIOVERSION;  Surgeon: Dolores Patty, MD;  Location: [facility] ENDOSCOPY;  Service: Cardiovascular;  Laterality: N/A;   CARDIOVERSION N/A 02/25/2020   Procedure: CARDIOVERSION;  Surgeon: Pricilla Riffle, MD;  Location: [facility] ENDOSCOPY;  Service: Cardiovascular;  Laterality: N/A;   CARDIOVERSION N/A 05/25/2020   Procedure: CARDIOVERSION;  Surgeon: Lewayne Bunting, MD;  Location: [facility] ENDOSCOPY;  Service: Cardiovascular;  Laterality: N/A;   CARDIOVERSION N/A 07/10/2020   Procedure: CARDIOVERSION;  Surgeon: Jake Bathe, MD;  Location: [facility] ENDOSCOPY;  Service: Cardiovascular;  Laterality: N/A;   CLIPPING OF ATRIAL APPENDAGE N/A 08/22/2015   Procedure: CLIPPING OF ATRIAL APPENDAGE;  Surgeon: Kerin Perna, MD;  Location: [facility] OR;  Service: Open Heart Surgery;  Laterality: N/A;   COLONOSCOPY WITH PROPOFOL N/A 01/01/2022   Procedure: COLONOSCOPY WITH PROPOFOL;  Surgeon: Kathi Der, MD;  Location: [facility] ENDOSCOPY;  Service: Gastroenterology;  Laterality: N/A;   DOPPLER ECHOCARDIOGRAPHY  03/11/2002   EF 70-75%   FINGER SURGERY Left    Middle finger   FINGER TENDON REPAIR  1980's   "right little finger" (03/31/2012)   FLEXIBLE SIGMOIDOSCOPY N/A 12/30/2021   Procedure: FLEXIBLE SIGMOIDOSCOPY;  Surgeon: Charlott Rakes, MD;  Location: [facility] ENDOSCOPY;  Service: Gastroenterology;  Laterality: N/A;    INSERT / REPLACE / REMOVE PACEMAKER     St Jude   IR RADIOLOGIST EVAL & MGMT  05/06/2019   IR RADIOLOGIST EVAL & MGMT  10/06/2019   IR RADIOLOGIST EVAL & MGMT  10/31/2020   IR RADIOLOGIST EVAL & MGMT  01/18/2021   IR RADIOLOGIST EVAL & MGMT  03/08/2021   IR RADIOLOGIST EVAL & MGMT  06/13/2021   KIDNEY SURGERY Left    LEFT AND RIGHT HEART CATHETERIZATION WITH CORONARY ANGIOGRAM N/A 10/27/2012   Procedure: LEFT AND RIGHT HEART CATHETERIZATION WITH CORONARY ANGIOGRAM;  Surgeon: Pamella Pert, MD;  Location: [facility] CATH LAB;  Service: Cardiovascular;  Laterality: N/A;   LUMBAR DISC SURGERY  1980's X2;  2000's   MAZE N/A 08/22/2015   Procedure: MAZE;  Surgeon: Kerin Perna, MD;  Location: [facility] OR;  Service: Open Heart Surgery;  Laterality: N/A;   PACEMAKER INSERTION  03/31/2012   STJ Accent DR pacemaker implanted by Dr Johney Frame   PERMANENT PACEMAKER INSERTION N/A 03/31/2012  Procedure: PERMANENT PACEMAKER INSERTION;  Surgeon: [name], MD;  Location: [facility] CATH LAB;  Service: Cardiovascular;  Laterality: N/A;   PPM GENERATOR CHANGEOUT N/A 08/08/2020   Procedure: PPM GENERATOR CHANGEOUT;  Surgeon: [name], MD;  Location: [facility] INVASIVE CV LAB;  Service: Cardiovascular;  Laterality: N/A;   RADIOLOGY WITH ANESTHESIA Left 02/14/2021   Procedure: RADIOLOGY WITH ANESTHESIA LEFT RENAL CRYOABLATION;  Surgeon: Irish Lack, MD;  Location: [facility] ORS;  Service: Radiology;  Laterality: Left;   STUMP REVISION Right 10/09/2022   Procedure: REVISION RIGHT BELOW KNEE AMPUTATION;  Surgeon: [name], MD;  Location: [facility] OR;  Service: Orthopedics;  Laterality: Right;   STUMP REVISION Right 11/16/2022   Procedure: REVISION RIGHT BELOW KNEE AMPUTATION;  Surgeon: [name], MD;  Location: [facility] OR;  Service: Orthopedics;  Laterality: Right;   TEE WITHOUT CARDIOVERSION  01/30/2012   Procedure: TRANSESOPHAGEAL ECHOCARDIOGRAM (TEE);  Surgeon: Laurey Morale, MD;  Location: [facility] ENDOSCOPY;  Service:  Cardiovascular;  Laterality: N/A;  ablation next day   TEE WITHOUT CARDIOVERSION N/A 08/22/2015   Procedure: TRANSESOPHAGEAL ECHOCARDIOGRAM (TEE);  Surgeon: Kerin Perna, MD;  Location: [facility] OR;  Service: Open Heart Surgery;  Laterality: N/A;   TEE WITHOUT CARDIOVERSION N/A 10/19/2019   Procedure: TRANSESOPHAGEAL ECHOCARDIOGRAM (TEE);  Surgeon: Dolores Patty, MD;  Location: [facility] ENDOSCOPY;  Service: Cardiovascular;  Laterality: N/A;   US ECHOCARDIOGRAPHY  08/07/2009   EF 55-60%   Patient Active Problem List   Diagnosis Date Noted   AKI (acute kidney injury) (HCC) 12/22/2022   Fall 11/14/2022   Pain of right lower extremity due to injury 11/14/2022   Diabetic ulcer of lower leg (HCC) 11/13/2022   Osteomyelitis (HCC) 11/13/2022   Dyslipidemia 10/18/2022   Cellulitis of right lower extremity 10/17/2022   Dehiscence of amputation stump of right lower extremity (HCC) 10/09/2022   Below-knee amputation of right lower extremity (HCC) 10/09/2022   Gangrene of right foot (HCC) 09/04/2022   History of below-knee amputation of right lower extremity (HCC) 09/04/2022   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) 08/23/2022   CAP (community acquired pneumonia) 12/28/2021   Constipation 12/28/2021   Pain in both lower extremities 07/03/2021   Peripheral neuropathy 07/03/2021   Acute metabolic encephalopathy 06/09/2021   Marijuana dependence (HCC) 06/09/2021   DNR (do not resuscitate) 06/09/2021   Chest pain 06/08/2021   Renal lesion 02/14/2021   Status post cryoablation 02/14/2021   Allergic rhinitis 12/08/2020   Preop pulmonary/respiratory exam 03/27/2020   Acute on chronic diastolic (congestive) heart failure (HCC) 01/22/2020   Abdominal pain 01/22/2020   Hypothyroidism 12/13/2019   DM2 (diabetes mellitus, type 2) (HCC) 12/13/2019   Chronic kidney disease, stage 3b (HCC) 12/13/2019   Acute bacterial bronchitis 12/13/2019   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   Typical atrial flutter  (HCC)    Chronic respiratory failure with hypoxia (HCC) 05/23/2016   CAD (coronary artery disease) 05/23/2016   Persistent atrial fibrillation (HCC)    Atypical atrial flutter (HCC)    Visit for monitoring Tikosyn therapy 04/16/2016   Chronic diastolic CHF (congestive heart failure) (HCC) 02/03/2016   Cough 12/06/2015   Acute on chronic respiratory failure with hypoxia (HCC) 09/08/2015   Hallucinations 09/04/2015   Lymphadenopathy 09/04/2015   Ascending aortic aneurysm (HCC) 09/04/2015   Hypoxia 09/02/2015   S/P aortic valve replacement with bioprosthetic valve 09/02/2015   Pleural effusion 09/02/2015   S/P AVR 08/22/2015   GERD without esophagitis 09/20/2014   Intrinsic asthma 10/13/2013  Dyspnea 09/15/2013   Pulmonary hypertension (HCC) 09/15/2013   Shortness of breath 05/05/2013   Pacemaker-St.Jude 04/01/2012   Bradycardia 03/31/2012   Second degree AV block 03/31/2012   AV block, 1st degree 02/01/2012   PAF (paroxysmal atrial fibrillation) (HCC) 11/13/2011   Fatigue 11/13/2011   Essential hypertension 11/13/2011   Aortic stenosis 11/13/2011   Sleep apnea 11/13/2011    PCP: Alysia Penna, MD  REFERRING PROVIDER: Aldean Baker, MD  ONSET DATE: 04/01/2023 prosthesis delivery  REFERRING DIAG: Z89.511 (ICD-10-CM) - S/P BKA (below knee amputation), right   THERAPY DIAG:  Other abnormalities of gait and mobility  Unsteadiness on feet  Pain in right leg  Pain in left leg  Muscle weakness (generalized)  Rationale for Evaluation and Treatment: Rehabilitation  SUBJECTIVE:   SUBJECTIVE STATEMENT: He says more pain in left leg today, but still with pain in Rt leg. His left leg wants to give out on him.   PERTINENT HISTORY:  right TTA 09/04/22, DM2, HLD, asthma, COPD, A-Fib, CAD, pacemaker, cardioversion & catheterization, arthritis, Fibromyalgia, GERD, HTN,   PAIN:  Are you having pain? Yes: NPRS scale: 5/10 Right leg, 9/10 left leg Pain location: both legs Pain  description: neuropathy, sharp, sticking "big" needles in leg Aggravating factors: meds wearing off,  standing & walking Relieving factors: pain pill 2x/day  Residual limb prosthesis on limb 0/10 - 10/10,  stand or walk 5/10 - 10/10  PRECAUTIONS: Fall and ICD/Pacemaker  WEIGHT BEARING RESTRICTIONS: No  FALLS: Has patient fallen in last 6 months? Yes. Number of falls 1 no injuries  LIVING ENVIRONMENT: Lives with: lives with their spouse and 3 dogs (mini schnauzer 15# indoor & 2 german Bassett outdoor)  Lives in: [place] Home Access: Ramped entrance Home layout: One level Stairs: Yes: External: 6 steps; can reach both ramp at primary entrance Has following equipment at home: Single point cane, Environmental consultant - 2 wheeled, Wheelchair (manual), shower chair, Tour manager, and Ramped entry  OCCUPATION: retired from [employer]  PLOF: Independent, Independent with household mobility without device, and Independent with community mobility without device used oxygen as needed  PATIENT GOALS:  use prosthesis to function in community & in house, do odd repairs, yard work  OBJECTIVE:  COGNITION: Overall cognitive status: Within functional limits for tasks assessed   SENSATION: WFL  CARDIOVASCULAR RESPONSE: Functional activity: balance and gait assessments Pre-activity vitals: no oxygen seated in w/c HR: 67 SpO2: 93 Post-activity vitals: Berg with no oxygen  HR: 78 SpO2: 90-91%  94% on oxygen at rest, HR 70 After gait with O2 SpO2 94%, HR 73  Modified Borg scale for dyspnea: 2: mild shortness of breath  POSTURE: rounded shoulders, forward head, flexed trunk , and weight shift left  LOWER EXTREMITY ROM:  ROM P:passive  A:active Right eval Left eval  Hip flexion    Hip extension standing -10*    Hip abduction    Hip adduction    Hip internal rotation    Hip external rotation    Knee flexion    Knee extension    Ankle dorsiflexion    Ankle plantarflexion    Ankle inversion     Ankle eversion     (Blank rows = not tested)  LOWER EXTREMITY MMT:  MMT Right eval Left eval  Hip flexion    Hip extension 3-4/5 3-4/5  Hip abduction 3-4/5 4/5  Hip adduction    Hip internal rotation    Hip external rotation    Knee flexion 3-4/5 4/5  Knee extension 3-4/5 4/5  Ankle dorsiflexion    Ankle plantarflexion    Ankle inversion    Ankle eversion    At Evaluation all strength testing is grossly seated and functionally standing / gait. (Blank rows = not tested)  TRANSFERS: Sit to stand: SBA from 20" w/c using BUEs on armrests to arise stabilizes with back of legs against w/c Stand to sit: SBA uses BUEs on armrests of w/c to control descent  FUNCTIONAL TESTs:  Berg Balance Scale: 19/56    GAIT: Gait pattern: step to pattern, decreased step length- Left, decreased stance time- Right, decreased hip/knee flexion- Right, decreased ankle dorsiflexion- Right, Right hip hike, knee flexed in stance- Right, lateral hip instability, trunk flexed, and abducted- Right Distance walked: 25' Assistive device utilized: Environmental consultant - 2 wheeled and TTA prosthesis Level of assistance: Min A balance loss to left on straight path & 180* turn Comments: used O2 with portable unit slung over left shoulder,  pain in residual limb limited distance,   CURRENT PROSTHETIC WEAR ASSESSMENT: 06/03/2023:  Patient is dependent with: skin check, residual limb care, care of non-amputated limb, prosthetic cleaning, ply sock cleaning, correct ply sock adjustment, proper wear schedule/adjustment, and proper weight-bearing schedule/adjustment Donning prosthesis: dependent with PT 100% cues on technique Doffing prosthesis: reports modified independent Prosthetic wear tolerance: 2-4 hours, 1x/day, ~3 days/week Prosthetic weight bearing tolerance: 5 minutes with partial weight on prosthesis with pain increasing to 8/10 (intolerable) Edema: pitting with 7 sec capillary refill Residual limb condition: dry skin  especially distal limb, 3 scratches with superficial scab on distal limb, scar healed but adhered over entire area of distal tibia, bulbous shape,  Prosthetic description: silicon liner with pin lock suspension, total contact socket with flexible inner socket, dynamic response foot K code/activity level with prosthetic use: Level 3    TODAY'S TREATMENT:                                                                                                                             DATE:  06/25/2023: Prosthetic Care with TTA prosthesis: -Patient ambuated CGA with RW max tolerated distance of 50 feet.  VC for looking up when ambulating and proper positioning and sequencing of RW push. Then performed one more round of 30 feet -Leg press DL 40# J81, then DL 19# X 15, then single leg 25# X 10 bilat. Cues and considerations for performing with prosthesis -Standing HEP at sink, performed trial set of each, see below for details and specifics.   06/23/2023: Prosthetic Care with TTA prosthesis: -Patient ambuated CGA with RW 16ft + 72ft with seated rest break and step (curb) / ramp negotiation between. VC for looking up when ambulating and proper positioning and sequencing of RW push. CGA for stand>sit 18in chair w/o armrest with patient using RW heavily with semicontrolled descent. Sit>stand required 2 attempts with patient attempting to use BUE on walker to rise. VC and explanation on proper sequencing for correction of  transfer. Patient verbalizing understanding. -6in curb minA / CGA with VC for proper placement of walker. When descending pt had instance of R knee buckling before attempting to lower walker, also demonstrated increased posterior lean with patient able to self correct with vc -ramp x2 bouts; 1st bout: ascending- CGA with heavy VC for sequencing of walker and positioning within walker as well as smaller steps. Descending- vc for proper placement of UE on walker, positioning within walker, and  sequencing of walker. 2nd bout: CGA with VC for progressing walker, patient able to step appropriately. Desceding- patient had 1 instance of R knee buckling requiring minA for prevention of fall, vc for proper distance between self and walker for safety and to catch himself if knee buckles.  -PT educated pt & wife in target to wear prosthesis all awake hours.  PT recommended 3-4 hours 2x/day to improve overall wear but have a break between for limiting residual limb discomfort. Both verbalized understanding.    Patient/wife given tennis balls for his own RW to assist with ambulation and ease of pushing walker.    TREATMENT:                                                                                                                             DATE:  06/03/2023: Prosthetic Care with TTA prosthesis: See patient education below.  PATIENT EDUCATION: PATIENT EDUCATED ON FOLLOWING PROSTHETIC CARE: Education details:  Skin check, Residual limb care, Prosthetic cleaning, Correct ply sock adjustment, Propper donning, and Proper wear schedule/adjustment Prosthetic wear tolerance: 2 hours 2x/day, 7 days/week Person educated: Patient and Spouse Education method: Explanation, Demonstration, Tactile cues, and Verbal cues Education comprehension: verbalized understanding, verbal cues required, tactile cues required, and needs further education  HOME EXERCISE PROGRAM: Printed and issued 06/25/23 Do each exercise 1-2  times per day Do each exercise 5-10 repetitions Hold each exercise for 2 seconds to feel your location  AT SINK FIND YOUR MIDLINE POSITION AND PLACE FEET EQUAL DISTANCE FROM THE MIDLINE.  Try to find this position when standing still for activities.   USE TAPE ON FLOOR TO MARK THE MIDLINE POSITION which is even with middle of sink.  You also should try to feel with your limb pressure in socket.  You are trying to feel with limb what you used to feel with the bottom of your foot.  Side to  Side Shift: Moving your hips only (not shoulders): move weight onto your left leg, HOLD/FEEL pressure in socket.  Move back to equal weight on each leg, HOLD/FEEL pressure in socket. Move weight onto your right leg, HOLD/FEEL pressure in socket. Move back to equal weight on each leg, HOLD/FEEL pressure in socket. Repeat.  Start with both hands on sink, progress to hand on prosthetic side only, then no hands.  Front to Back Shift: Moving your hips only (not shoulders): move your weight forward onto your toes, HOLD/FEEL pressure in socket. Move your weight back to equal  Flat Foot on both legs, HOLD/FEEL  pressure in socket. Move your weight back onto your heels, HOLD/FEEL  pressure in socket. Move your weight back to equal on both legs, HOLD/FEEL  pressure in socket. Repeat.  Start with both hands on sink, progress to hand on prosthetic side only, then no hands.  Moving Cones / Cups: With equal weight on each leg: Hold on with one hand the first time, then progress to no hand supports. Move cups from one side of sink to the other. Place cups ~2" out of your reach, progress to 10" beyond reach.  Place one hand in middle of sink and reach with other hand. Do both arms.  Then hover one hand and move cups with other hand.  Overhead/Upward Reaching: alternated reaching up to top cabinets or ceiling if no cabinets present. Keep equal weight on each leg. Start with one hand support on counter while other hand reaches and progress to no hand support with reaching.  ace one hand in middle of sink and reach with other hand. Do both arms.  Then hover one hand and move cups with other hand.  5.   Looking Over Shoulders: With equal weight on each leg: alternate turning to look over your shoulders with one hand support on counter as needed.  Start with head motions only to look in front of shoulder, then even with shoulder and progress to looking behind you. To look to side, move head /eyes, then shoulder on side looking pulls  back, shift more weight to side looking and pull hip back. Place one hand in middle of sink and let go with other hand so your shoulder can pull back. Switch hands to look other way.   Then hover one hand and look over shoulder. If looking right, use left hand at sink. If looking left, use right hand at sink. 6.  Stepping with leg that is not amputated:  Move items under cabinet out of your way. Shift your hips/pelvis so weight on prosthesis. Tighten muscles in hip on prosthetic side.  SLOWLY step other leg so front of foot is in cabinet. Then step back to floor.   ASSESSMENT:  CLINICAL IMPRESSION: Despite higher reported pain levels he was able to progress his ambulation tolerance and overall activity tolerance today. We will monitor for any soreness and adjust accordingly. I did also print out and review with him the HEP for standing at sink to improve balance, proprioception and standing tolerance for ADL's. He will likely need reinforcement on this for proper technique. Although fatigued at end of session SP02 was 98% with entire session performed on room air and he did not use his oxygen.   OBJECTIVE IMPAIRMENTS: Abnormal gait, cardiopulmonary status limiting activity, decreased activity tolerance, decreased balance, decreased endurance, decreased knowledge of condition, decreased knowledge of use of DME, decreased mobility, difficulty walking, decreased ROM, decreased strength, increased edema, prosthetic dependency , and pain.   ACTIVITY LIMITATIONS: carrying, lifting, bending, standing, stairs, transfers, and locomotion level  PARTICIPATION LIMITATIONS: meal prep, cleaning, driving, community activity, and yard work  PERSONAL FACTORS: Fitness, Time since onset of injury/illness/exacerbation, and 3+ comorbidities: see PMH  are also affecting patient's functional outcome.   REHAB POTENTIAL: Good  CLINICAL DECISION MAKING: Evolving/moderate complexity  EVALUATION COMPLEXITY:  Moderate   GOALS: Goals reviewed with patient? Yes  SHORT TERM GOALS: Target date: 07/02/2023  Patient donnes prosthesis modified independent & verbalizes proper cleaning. Baseline: SEE OBJECTIVE DATA Goal status: on going 06/23/2023 2.  Patient tolerates prosthesis >6 hrs total /day without skin issues or limb pain >5/10 after standing. Baseline: SEE OBJECTIVE DATA Goal status: on going 06/23/2023  3.  Patient able to reach 5" and looks to sides without UE support with supervision. Baseline: SEE OBJECTIVE DATA Goal status: on going 06/23/2023  4. Patient ambulates 71' with RW & prosthesis with supervision. Baseline: SEE OBJECTIVE DATA Goal status: on going 06/23/2023  5. Patient negotiates ramps & curbs with RW & prosthesis with minA. Baseline: SEE OBJECTIVE DATA Goal status: MET 06/23/2023  LONG TERM GOALS: Target date: 08/28/2023  Patient demonstrates & verbalized understanding of prosthetic care to enable safe utilization of prosthesis. Baseline: SEE OBJECTIVE DATA Goal status: MET 06/23/2023  Patient tolerates prosthesis wear >90% of awake hours without skin or limb pain issues. Baseline: SEE OBJECTIVE DATA Goal status: on going 06/23/2023  Berg Balance >36/56 to indicate lower fall risk Baseline: SEE OBJECTIVE DATA Goal status: on going 06/23/2023  Patient ambulates >300' with prosthesis & LRAD independently Baseline: SEE OBJECTIVE DATA Goal status: on going 06/23/2023  Patient negotiates ramps, curbs & stairs with single rail with prosthesis & LRAD independently. Baseline: SEE OBJECTIVE DATA Goal status: on going 06/23/2023   PLAN:  PT FREQUENCY: 2x/week  PT DURATION: 12 weeks  PLANNED INTERVENTIONS: 97164- PT Re-evaluation, 97110-Therapeutic exercises, 97530- Therapeutic activity, 97112- Neuromuscular re-education, [phone]- Self Care, 60454- Gait training, [phone]- Prosthetic training, Patient/Family education, Balance training, Stair training, Scar mobilization,  Vestibular training, DME instructions, and Physical Performance Testing  PLAN FOR NEXT SESSION:  Review Hep at sink, continue ambulation, curb, and ramp. Activity tolerance, explanation of short/med/long walks, see what his max distance is.    Date of referral: 04/17/2023  PT eval delayed by COPD exacerbation Referring provider: Aldean Baker, MD Referring diagnosis? Z89.511 (ICD-10-CM) - S/P BKA (below knee amputation), right Treatment diagnosis? (if different than referring diagnosis)   Other abnormalities of gait & mobility R26.89 Unsteadiness on feet R26.81 Pain in left leg M79.605 Pain in right leg M79.604 Muscle Weakness (generalized) M62.81 History of falling Z91.81 Impaired functional mobility, balance, gait and endurance  What was this (referring dx) caused by? Surgery (Type: right BKA)  Nature of Condition: Initial Onset (within last 3 months)  prosthesis delivery 04/01/2023   Laterality: Rt  Current Functional Measure Score: Other Berg Balance 19/56  Objective measurements identify impairments when they are compared to normal values, the uninvolved extremity, and prior level of function.  [x]  Yes  []  No  Objective assessment of functional ability: Severe functional limitations   Briefly describe symptoms: dependent in care & use of prosthesis, impaired balance and gait, pain in both legs  How did symptoms start: amputation from wounds  Average pain intensity:  Last 24 hours: 8/10  Past week: 8/10  How often does the pt experience symptoms? Constantly  How much have the symptoms interfered with usual daily activities? Extremely  How has condition changed since care began at this facility? NA - initial visit  In general, how is the patients overall health? Good   BACK PAIN (STarT Back Screening Tool) No   April Manson, PT, DPT 06/25/2023, 10:47 AM

## 2023-06-26 ENCOUNTER — Other Ambulatory Visit (HOSPITAL_COMMUNITY): Payer: Self-pay | Admitting: Cardiology

## 2023-06-26 ENCOUNTER — Ambulatory Visit: Payer: Medicare Other | Admitting: Orthopedic Surgery

## 2023-06-26 MED ORDER — EMPAGLIFLOZIN 10 MG PO TABS: 10.0000 mg | ORAL_TABLET | Freq: Every day | ORAL | 6 refills | Status: DC

## 2023-06-30 ENCOUNTER — Encounter: Payer: Self-pay | Admitting: Physical Therapy

## 2023-06-30 ENCOUNTER — Ambulatory Visit: Payer: Medicare Other | Admitting: Physical Therapy

## 2023-06-30 DIAGNOSIS — R2681 Unsteadiness on feet: Secondary | ICD-10-CM | POA: Diagnosis not present

## 2023-06-30 DIAGNOSIS — Z9181 History of falling: Secondary | ICD-10-CM

## 2023-06-30 DIAGNOSIS — M6281 Muscle weakness (generalized): Secondary | ICD-10-CM

## 2023-06-30 DIAGNOSIS — R2689 Other abnormalities of gait and mobility: Secondary | ICD-10-CM

## 2023-06-30 DIAGNOSIS — M79604 Pain in right leg: Secondary | ICD-10-CM

## 2023-06-30 DIAGNOSIS — M79605 Pain in left leg: Secondary | ICD-10-CM | POA: Diagnosis not present

## 2023-06-30 DIAGNOSIS — Z7409 Other reduced mobility: Secondary | ICD-10-CM

## 2023-06-30 NOTE — Therapy (Signed)
 OUTPATIENT PHYSICAL THERAPY PROSTHETIC TREATMENT   Patient Name: [name] MRN: [id number] DOB:09-16-1950, 73 y.o., male Today's Date: 06/30/2023  END OF SESSION:  PT End of Session - 06/30/23 1141     Visit Number 4    Number of Visits 25    Date for PT Re-Evaluation 08/28/23    Authorization Type [employer] Medicare    Progress Note Due on Visit 10    PT Start Time 1140    PT Stop Time 1223    PT Time Calculation (min) 43 min    Equipment Utilized During Treatment Gait belt    Activity Tolerance Patient tolerated treatment well;Patient limited by pain;Patient limited by fatigue    Behavior During Therapy [facility] for tasks assessed/performed               Past Medical History:  Diagnosis Date   Aortic stenosis, mild    Arthritis of hand    "just a little bit in both hands" (03/31/2012)   Asthma    "little bit" (03/31/2012)   Atrial fibrillation (HCC)    dx '04; DCCV '04, placed on flecainide, failed DCCV 04/2010, flecainide stopped; s/p successful A.fib ablation 01/31/12   CHF (congestive heart failure) (HCC)    Controlled type 2 diabetes mellitus without complication, without long-term current use of insulin (HCC) 12/13/2019   COPD (chronic obstructive pulmonary disease) (HCC)    DJD (degenerative joint disease)    Fatty liver    Fibromyalgia    GERD (gastroesophageal reflux disease)    Hyperlipidemia    Hypertension    Hypothyroidism    S/P radiation   Left atrial enlargement    LA size 45mm by echo 11/21/11   Mitral regurgitation    trivial   Obstructive sleep apnea    mild by sleep study 2013; pt stated he does not have a machine because "it wasn't bad enough for him to have one"   Pacemaker 03/31/2012   Pneumonia    last time 2023   Restless leg syndrome    Second degree AV block    Splenomegaly    Past Surgical History:  Procedure Laterality Date   AMPUTATION Right 08/23/2022   Procedure: RIGHT FOOT FIFTH RAY AMPUTATION;  Surgeon: [name], MD;   Location: [facility] OR;  Service: Orthopedics;  Laterality: Right;   AMPUTATION Right 09/04/2022   Procedure: RIGHT BELOW KNEE AMPUTATION;  Surgeon: [name], MD;  Location: [facility] OR;  Service: Orthopedics;  Laterality: Right;   AORTIC VALVE REPLACEMENT N/A 08/22/2015   Procedure: AORTIC VALVE REPLACEMENT (AVR);  Surgeon: Kerin Perna, MD;  Location: [facility] OR;  Service: Open Heart Surgery;  Laterality: N/A;   ATRIAL FIBRILLATION ABLATION  01/30/2012   PVI by Dr. Johney Frame   ATRIAL FIBRILLATION ABLATION N/A 01/31/2012   Procedure: ATRIAL FIBRILLATION ABLATION;  Surgeon: [name], MD;  Location: [facility] CATH LAB;  Service: Cardiovascular;  Laterality: N/A;   ATRIAL FIBRILLATION ABLATION N/A 04/19/2020   Procedure: ATRIAL FIBRILLATION ABLATION;  Surgeon: [name], MD;  Location: [facility] INVASIVE CV LAB;  Service: Cardiovascular;  Laterality: N/A;   BACK SURGERY     X 3   BIOPSY  01/01/2022   Procedure: BIOPSY;  Surgeon: Kathi Der, MD;  Location: [facility] ENDOSCOPY;  Service: Gastroenterology;;   CARDIAC CATHETERIZATION N/A 08/09/2015   Procedure: Right/Left Heart Cath and Coronary Angiography;  Surgeon: Peter M Swaziland, MD;  Location: [facility] INVASIVE CV LAB;  Service: Cardiovascular;  Laterality: N/A;   CARDIAC CATHETERIZATION  N/A 02/05/2016   Procedure: Right/Left Heart Cath and Coronary Angiography;  Surgeon: Corky Crafts, MD;  Location: [facility] INVASIVE CV LAB;  Service: Cardiovascular;  Laterality: N/A;   CARDIAC CATHETERIZATION N/A 04/17/2016   Procedure: Right Heart Cath;  Surgeon: Dolores Patty, MD;  Location: [facility] INVASIVE CV LAB;  Service: Cardiovascular;  Laterality: N/A;   CARDIOVASCULAR STRESS TEST  03/12/2002   EF 48%, NO EVIDENCE OF ISCHEMIA   CARDIOVERSION  01/2012; 03/31/2012   CARDIOVERSION N/A 02/25/2012   Procedure: CARDIOVERSION;  Surgeon: [name], MD;  Location: [facility] CATH LAB;  Service: Cardiovascular;  Laterality: N/A;   CARDIOVERSION N/A 03/31/2012   Procedure: CARDIOVERSION;   Surgeon: [name], MD;  Location: [facility] CATH LAB;  Service: Cardiovascular;  Laterality: N/A;   CARDIOVERSION N/A 11/23/2015   Procedure: CARDIOVERSION;  Surgeon: Laurey Morale, MD;  Location: [facility] ENDOSCOPY;  Service: Cardiovascular;  Laterality: N/A;   CARDIOVERSION N/A 04/08/2016   Procedure: CARDIOVERSION;  Surgeon: Dolores Patty, MD;  Location: [facility] ENDOSCOPY;  Service: Cardiovascular;  Laterality: N/A;   CARDIOVERSION N/A 09/14/2018   Procedure: CARDIOVERSION;  Surgeon: Wendall Stade, MD;  Location: [facility] ENDOSCOPY;  Service: Cardiovascular;  Laterality: N/A;   CARDIOVERSION N/A 10/19/2019   Procedure: CARDIOVERSION;  Surgeon: Dolores Patty, MD;  Location: [facility] ENDOSCOPY;  Service: Cardiovascular;  Laterality: N/A;   CARDIOVERSION N/A 02/25/2020   Procedure: CARDIOVERSION;  Surgeon: Pricilla Riffle, MD;  Location: [facility] ENDOSCOPY;  Service: Cardiovascular;  Laterality: N/A;   CARDIOVERSION N/A 05/25/2020   Procedure: CARDIOVERSION;  Surgeon: Lewayne Bunting, MD;  Location: [facility] ENDOSCOPY;  Service: Cardiovascular;  Laterality: N/A;   CARDIOVERSION N/A 07/10/2020   Procedure: CARDIOVERSION;  Surgeon: Jake Bathe, MD;  Location: [facility] ENDOSCOPY;  Service: Cardiovascular;  Laterality: N/A;   CLIPPING OF ATRIAL APPENDAGE N/A 08/22/2015   Procedure: CLIPPING OF ATRIAL APPENDAGE;  Surgeon: Kerin Perna, MD;  Location: [facility] OR;  Service: Open Heart Surgery;  Laterality: N/A;   COLONOSCOPY WITH PROPOFOL N/A 01/01/2022   Procedure: COLONOSCOPY WITH PROPOFOL;  Surgeon: Kathi Der, MD;  Location: [facility] ENDOSCOPY;  Service: Gastroenterology;  Laterality: N/A;   DOPPLER ECHOCARDIOGRAPHY  03/11/2002   EF 70-75%   FINGER SURGERY Left    Middle finger   FINGER TENDON REPAIR  1980's   "right little finger" (03/31/2012)   FLEXIBLE SIGMOIDOSCOPY N/A 12/30/2021   Procedure: FLEXIBLE SIGMOIDOSCOPY;  Surgeon: Charlott Rakes, MD;  Location: [facility] ENDOSCOPY;  Service: Gastroenterology;  Laterality: N/A;    INSERT / REPLACE / REMOVE PACEMAKER     St Jude   IR RADIOLOGIST EVAL & MGMT  05/06/2019   IR RADIOLOGIST EVAL & MGMT  10/06/2019   IR RADIOLOGIST EVAL & MGMT  10/31/2020   IR RADIOLOGIST EVAL & MGMT  01/18/2021   IR RADIOLOGIST EVAL & MGMT  03/08/2021   IR RADIOLOGIST EVAL & MGMT  06/13/2021   KIDNEY SURGERY Left    LEFT AND RIGHT HEART CATHETERIZATION WITH CORONARY ANGIOGRAM N/A 10/27/2012   Procedure: LEFT AND RIGHT HEART CATHETERIZATION WITH CORONARY ANGIOGRAM;  Surgeon: Pamella Pert, MD;  Location: [facility] CATH LAB;  Service: Cardiovascular;  Laterality: N/A;   LUMBAR DISC SURGERY  1980's X2;  2000's   MAZE N/A 08/22/2015   Procedure: MAZE;  Surgeon: Kerin Perna, MD;  Location: [facility] OR;  Service: Open Heart Surgery;  Laterality: N/A;   PACEMAKER INSERTION  03/31/2012   STJ Accent DR pacemaker implanted by Dr Johney Frame   PERMANENT PACEMAKER INSERTION N/A  03/31/2012   Procedure: PERMANENT PACEMAKER INSERTION;  Surgeon: [name], MD;  Location: [facility] CATH LAB;  Service: Cardiovascular;  Laterality: N/A;   PPM GENERATOR CHANGEOUT N/A 08/08/2020   Procedure: PPM GENERATOR CHANGEOUT;  Surgeon: [name], MD;  Location: [facility] INVASIVE CV LAB;  Service: Cardiovascular;  Laterality: N/A;   RADIOLOGY WITH ANESTHESIA Left 02/14/2021   Procedure: RADIOLOGY WITH ANESTHESIA LEFT RENAL CRYOABLATION;  Surgeon: Irish Lack, MD;  Location: [facility] ORS;  Service: Radiology;  Laterality: Left;   STUMP REVISION Right 10/09/2022   Procedure: REVISION RIGHT BELOW KNEE AMPUTATION;  Surgeon: [name], MD;  Location: [facility] OR;  Service: Orthopedics;  Laterality: Right;   STUMP REVISION Right 11/16/2022   Procedure: REVISION RIGHT BELOW KNEE AMPUTATION;  Surgeon: [name], MD;  Location: [facility] OR;  Service: Orthopedics;  Laterality: Right;   TEE WITHOUT CARDIOVERSION  01/30/2012   Procedure: TRANSESOPHAGEAL ECHOCARDIOGRAM (TEE);  Surgeon: Laurey Morale, MD;  Location: [facility] ENDOSCOPY;  Service:  Cardiovascular;  Laterality: N/A;  ablation next day   TEE WITHOUT CARDIOVERSION N/A 08/22/2015   Procedure: TRANSESOPHAGEAL ECHOCARDIOGRAM (TEE);  Surgeon: Kerin Perna, MD;  Location: [facility] OR;  Service: Open Heart Surgery;  Laterality: N/A;   TEE WITHOUT CARDIOVERSION N/A 10/19/2019   Procedure: TRANSESOPHAGEAL ECHOCARDIOGRAM (TEE);  Surgeon: Dolores Patty, MD;  Location: [facility] ENDOSCOPY;  Service: Cardiovascular;  Laterality: N/A;   US ECHOCARDIOGRAPHY  08/07/2009   EF 55-60%   Patient Active Problem List   Diagnosis Date Noted   AKI (acute kidney injury) (HCC) 12/22/2022   Fall 11/14/2022   Pain of right lower extremity due to injury 11/14/2022   Diabetic ulcer of lower leg (HCC) 11/13/2022   Osteomyelitis (HCC) 11/13/2022   Dyslipidemia 10/18/2022   Cellulitis of right lower extremity 10/17/2022   Dehiscence of amputation stump of right lower extremity (HCC) 10/09/2022   Below-knee amputation of right lower extremity (HCC) 10/09/2022   Gangrene of right foot (HCC) 09/04/2022   History of below-knee amputation of right lower extremity (HCC) 09/04/2022   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) 08/23/2022   CAP (community acquired pneumonia) 12/28/2021   Constipation 12/28/2021   Pain in both lower extremities 07/03/2021   Peripheral neuropathy 07/03/2021   Acute metabolic encephalopathy 06/09/2021   Marijuana dependence (HCC) 06/09/2021   DNR (do not resuscitate) 06/09/2021   Chest pain 06/08/2021   Renal lesion 02/14/2021   Status post cryoablation 02/14/2021   Allergic rhinitis 12/08/2020   Preop pulmonary/respiratory exam 03/27/2020   Acute on chronic diastolic (congestive) heart failure (HCC) 01/22/2020   Abdominal pain 01/22/2020   Hypothyroidism 12/13/2019   DM2 (diabetes mellitus, type 2) (HCC) 12/13/2019   Chronic kidney disease, stage 3b (HCC) 12/13/2019   Acute bacterial bronchitis 12/13/2019   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   Typical atrial flutter  (HCC)    Chronic respiratory failure with hypoxia (HCC) 05/23/2016   CAD (coronary artery disease) 05/23/2016   Persistent atrial fibrillation (HCC)    Atypical atrial flutter (HCC)    Visit for monitoring Tikosyn therapy 04/16/2016   Chronic diastolic CHF (congestive heart failure) (HCC) 02/03/2016   Cough 12/06/2015   Acute on chronic respiratory failure with hypoxia (HCC) 09/08/2015   Hallucinations 09/04/2015   Lymphadenopathy 09/04/2015   Ascending aortic aneurysm (HCC) 09/04/2015   Hypoxia 09/02/2015   S/P aortic valve replacement with bioprosthetic valve 09/02/2015   Pleural effusion 09/02/2015   S/P AVR 08/22/2015   GERD without esophagitis 09/20/2014  Intrinsic asthma 10/13/2013   Dyspnea 09/15/2013   Pulmonary hypertension (HCC) 09/15/2013   Shortness of breath 05/05/2013   Pacemaker-St.Jude 04/01/2012   Bradycardia 03/31/2012   Second degree AV block 03/31/2012   AV block, 1st degree 02/01/2012   PAF (paroxysmal atrial fibrillation) (HCC) 11/13/2011   Fatigue 11/13/2011   Essential hypertension 11/13/2011   Aortic stenosis 11/13/2011   Sleep apnea 11/13/2011    PCP: Alysia Penna, MD  REFERRING PROVIDER: Aldean Baker, MD  ONSET DATE: 04/01/2023 prosthesis delivery  REFERRING DIAG: Z89.511 (ICD-10-CM) - S/P BKA (below knee amputation), right   THERAPY DIAG:  Other abnormalities of gait and mobility  Unsteadiness on feet  Pain in right leg  Pain in left leg  Muscle weakness (generalized)  History of falling  Impaired functional mobility, balance, gait, and endurance  Rationale for Evaluation and Treatment: Rehabilitation  SUBJECTIVE:   SUBJECTIVE STATEMENT: He is wearing prosthesis 4-6 hours total. Some times 2x/day. His left leg is better.   PERTINENT HISTORY:  right TTA 09/04/22, DM2, HLD, asthma, COPD, A-Fib, CAD, pacemaker, cardioversion & catheterization, arthritis, Fibromyalgia, GERD, HTN,   PAIN:  Are you having pain? Yes: NPRS scale:   7/10 Right leg, 10/10 left leg Pain location: both legs Pain description: neuropathy, sharp, sticking "big" needles in leg Aggravating factors: meds wearing off,  standing & walking Relieving factors: pain pill 2x/day  Residual limb prosthesis on limb 0/10 - 10/10,  stand or walk 5/10 - 10/10  PRECAUTIONS: Fall and ICD/Pacemaker  WEIGHT BEARING RESTRICTIONS: No  FALLS: Has patient fallen in last 6 months? Yes. Number of falls 1 no injuries  LIVING ENVIRONMENT: Lives with: lives with their spouse and 3 dogs (mini schnauzer 15# indoor & 2 german [place] outdoor)  Lives in: [place] Home Access: Ramped entrance Home layout: One level Stairs: Yes: External: 6 steps; can reach both ramp at primary entrance Has following equipment at home: Single point cane, Environmental consultant - 2 wheeled, Wheelchair (manual), shower chair, Tour manager, and Ramped entry  OCCUPATION: retired from [employer]  PLOF: Independent, Independent with household mobility without device, and Independent with community mobility without device used oxygen as needed  PATIENT GOALS:  use prosthesis to function in community & in house, do odd repairs, yard work  OBJECTIVE:  COGNITION: Overall cognitive status: Within functional limits for tasks assessed   SENSATION: WFL  CARDIOVASCULAR RESPONSE: Functional activity: balance and gait assessments Pre-activity vitals: no oxygen seated in w/c HR: 67 SpO2: 93 Post-activity vitals: Berg with no oxygen  HR: 78 SpO2: 90-91%  94% on oxygen at rest, HR 70 After gait with O2 SpO2 94%, HR 73  Modified Borg scale for dyspnea: 2: mild shortness of breath  POSTURE: rounded shoulders, forward head, flexed trunk , and weight shift left  LOWER EXTREMITY ROM:  ROM P:passive  A:active Right eval Left eval  Hip flexion    Hip extension standing -10*    Hip abduction    Hip adduction    Hip internal rotation    Hip external rotation    Knee flexion    Knee extension     Ankle dorsiflexion    Ankle plantarflexion    Ankle inversion    Ankle eversion     (Blank rows = not tested)  LOWER EXTREMITY MMT:  MMT Right eval Left eval  Hip flexion    Hip extension 3-4/5 3-4/5  Hip abduction 3-4/5 4/5  Hip adduction    Hip internal rotation    Hip  external rotation    Knee flexion 3-4/5 4/5  Knee extension 3-4/5 4/5  Ankle dorsiflexion    Ankle plantarflexion    Ankle inversion    Ankle eversion    At Evaluation all strength testing is grossly seated and functionally standing / gait. (Blank rows = not tested)  TRANSFERS: Sit to stand: SBA from 20" w/c using BUEs on armrests to arise stabilizes with back of legs against w/c Stand to sit: SBA uses BUEs on armrests of w/c to control descent  FUNCTIONAL TESTs:  Berg Balance Scale: 19/56    GAIT: Gait pattern: step to pattern, decreased step length- Left, decreased stance time- Right, decreased hip/knee flexion- Right, decreased ankle dorsiflexion- Right, Right hip hike, knee flexed in stance- Right, lateral hip instability, trunk flexed, and abducted- Right Distance walked: 25' Assistive device utilized: Environmental consultant - 2 wheeled and TTA prosthesis Level of assistance: Min A balance loss to left on straight path & 180* turn Comments: used O2 with portable unit slung over left shoulder,  pain in residual limb limited distance,   CURRENT PROSTHETIC WEAR ASSESSMENT: 06/03/2023:  Patient is dependent with: skin check, residual limb care, care of non-amputated limb, prosthetic cleaning, ply sock cleaning, correct ply sock adjustment, proper wear schedule/adjustment, and proper weight-bearing schedule/adjustment Donning prosthesis: dependent with PT 100% cues on technique Doffing prosthesis: reports modified independent Prosthetic wear tolerance: 2-4 hours, 1x/day, ~3 days/week Prosthetic weight bearing tolerance: 5 minutes with partial weight on prosthesis with pain increasing to 8/10 (intolerable) Edema:  pitting with 7 sec capillary refill Residual limb condition: dry skin especially distal limb, 3 scratches with superficial scab on distal limb, scar healed but adhered over entire area of distal tibia, bulbous shape,  Prosthetic description: silicon liner with pin lock suspension, total contact socket with flexible inner socket, dynamic response foot K code/activity level with prosthetic use: Level 3    TODAY'S TREATMENT:                                                                                                                             DATE:  06/30/2023: Prosthetic Care with TTA prosthesis: -Patient's residual limb has dry scabs on incision that is healing.  PT educated with demo & verbal cues on donning Vivewear compresssion sock. Both verbalized understanding.  -PT recommended increasing wear to 4-5 hours 2x/day and explained rationale for increasing wear to most of awake hours.  Pt & wife verbalized understanding.  -PT demo & verbal cues on adjusting RW height to facilitate upright posture.  PT demo & verbal cues on step through pattern staying within RW and proper step width using Theraband as visual target.  Pt amb 140' and 100' with CGA carry over of above.  He had 2 balance losses with turning when he got his feet outside RW base.   -pt neg ramp & curb with RW with minA and verbal cues on technique. -PT recommended increasing activity tolerance: short walks in home  working to increase to all household mobility with RW gait;  long walk (his max tolerance which may have LE moderate pain) 1-2 times per day / could be starting to walk into community buildings wife following with w/c for when he fatigues;  medium walks (distance more than household but not his max tolerance) may be walking in/out of house to car 4+ / day.  Pt & wife verbalized understanding.    TREATMENT:                                                                                                                              DATE:  06/25/2023: Prosthetic Care with TTA prosthesis: -Patient ambuated CGA with RW max tolerated distance of 50 feet.  VC for looking up when ambulating and proper positioning and sequencing of RW push. Then performed one more round of 30 feet -Leg press DL 08# M57, then DL 84# X 15, then single leg 25# X 10 bilat. Cues and considerations for performing with prosthesis -Standing HEP at sink, performed trial set of each, see below for details and specifics.   06/23/2023: Prosthetic Care with TTA prosthesis: -Patient ambuated CGA with RW 30ft + 90ft with seated rest break and step (curb) / ramp negotiation between. VC for looking up when ambulating and proper positioning and sequencing of RW push. CGA for stand>sit 18in chair w/o armrest with patient using RW heavily with semicontrolled descent. Sit>stand required 2 attempts with patient attempting to use BUE on walker to rise. VC and explanation on proper sequencing for correction of transfer. Patient verbalizing understanding. -6in curb minA / CGA with VC for proper placement of walker. When descending pt had instance of R knee buckling before attempting to lower walker, also demonstrated increased posterior lean with patient able to self correct with vc -ramp x2 bouts; 1st bout: ascending- CGA with heavy VC for sequencing of walker and positioning within walker as well as smaller steps. Descending- vc for proper placement of UE on walker, positioning within walker, and sequencing of walker. 2nd bout: CGA with VC for progressing walker, patient able to step appropriately. Desceding- patient had 1 instance of R knee buckling requiring minA for prevention of fall, vc for proper distance between self and walker for safety and to catch himself if knee buckles.  -PT educated pt & wife in target to wear prosthesis all awake hours.  PT recommended 3-4 hours 2x/day to improve overall wear but have a break between for limiting residual limb discomfort. Both  verbalized understanding.    Patient/wife given tennis balls for his own RW to assist with ambulation and ease of pushing walker.     PATIENT EDUCATION: PATIENT EDUCATED ON FOLLOWING PROSTHETIC CARE: Education details:  Skin check, Residual limb care, Prosthetic cleaning, Correct ply sock adjustment, Propper donning, and Proper wear schedule/adjustment Prosthetic wear tolerance: 2 hours 2x/day, 7 days/week Person educated: Patient and Spouse Education method: Explanation, Demonstration, Tactile cues, and Verbal cues  Education comprehension: verbalized understanding, verbal cues required, tactile cues required, and needs further education  HOME EXERCISE PROGRAM: Printed and issued 06/25/23 Do each exercise 1-2  times per day Do each exercise 5-10 repetitions Hold each exercise for 2 seconds to feel your location  AT SINK FIND YOUR MIDLINE POSITION AND PLACE FEET EQUAL DISTANCE FROM THE MIDLINE.  Try to find this position when standing still for activities.   USE TAPE ON FLOOR TO MARK THE MIDLINE POSITION which is even with middle of sink.  You also should try to feel with your limb pressure in socket.  You are trying to feel with limb what you used to feel with the bottom of your foot.  Side to Side Shift: Moving your hips only (not shoulders): move weight onto your left leg, HOLD/FEEL pressure in socket.  Move back to equal weight on each leg, HOLD/FEEL pressure in socket. Move weight onto your right leg, HOLD/FEEL pressure in socket. Move back to equal weight on each leg, HOLD/FEEL pressure in socket. Repeat.  Start with both hands on sink, progress to hand on prosthetic side only, then no hands.  Front to Back Shift: Moving your hips only (not shoulders): move your weight forward onto your toes, HOLD/FEEL pressure in socket. Move your weight back to equal Flat Foot on both legs, HOLD/FEEL  pressure in socket. Move your weight back onto your heels, HOLD/FEEL  pressure in socket. Move your  weight back to equal on both legs, HOLD/FEEL  pressure in socket. Repeat.  Start with both hands on sink, progress to hand on prosthetic side only, then no hands.  Moving Cones / Cups: With equal weight on each leg: Hold on with one hand the first time, then progress to no hand supports. Move cups from one side of sink to the other. Place cups ~2" out of your reach, progress to 10" beyond reach.  Place one hand in middle of sink and reach with other hand. Do both arms.  Then hover one hand and move cups with other hand.  Overhead/Upward Reaching: alternated reaching up to top cabinets or ceiling if no cabinets present. Keep equal weight on each leg. Start with one hand support on counter while other hand reaches and progress to no hand support with reaching.  ace one hand in middle of sink and reach with other hand. Do both arms.  Then hover one hand and move cups with other hand.  5.   Looking Over Shoulders: With equal weight on each leg: alternate turning to look over your shoulders with one hand support on counter as needed.  Start with head motions only to look in front of shoulder, then even with shoulder and progress to looking behind you. To look to side, move head /eyes, then shoulder on side looking pulls back, shift more weight to side looking and pull hip back. Place one hand in middle of sink and let go with other hand so your shoulder can pull back. Switch hands to look other way.   Then hover one hand and look over shoulder. If looking right, use left hand at sink. If looking left, use right hand at sink. 6.  Stepping with leg that is not amputated:  Move items under cabinet out of your way. Shift your hips/pelvis so weight on prosthesis. Tighten muscles in hip on prosthetic side.  SLOWLY step other leg so front of foot is in cabinet. Then step back to floor.   ASSESSMENT:  CLINICAL IMPRESSION: Patient  improved gait with RW with PT instructions and repetition.  He seems to understand PT  recommendations for prosthesis wear and increase to activity level.  Patient continues to benefit from skilled PT.    OBJECTIVE IMPAIRMENTS: Abnormal gait, cardiopulmonary status limiting activity, decreased activity tolerance, decreased balance, decreased endurance, decreased knowledge of condition, decreased knowledge of use of DME, decreased mobility, difficulty walking, decreased ROM, decreased strength, increased edema, prosthetic dependency , and pain.   ACTIVITY LIMITATIONS: carrying, lifting, bending, standing, stairs, transfers, and locomotion level  PARTICIPATION LIMITATIONS: meal prep, cleaning, driving, community activity, and yard work  PERSONAL FACTORS: Fitness, Time since onset of injury/illness/exacerbation, and 3+ comorbidities: see PMH  are also affecting patient's functional outcome.   REHAB POTENTIAL: Good  CLINICAL DECISION MAKING: Evolving/moderate complexity  EVALUATION COMPLEXITY: Moderate   GOALS: Goals reviewed with patient? Yes  SHORT TERM GOALS: Target date: 07/02/2023  Patient donnes prosthesis modified independent & verbalizes proper cleaning. Baseline: SEE OBJECTIVE DATA Goal status:   MET 06/30/2023 2.  Patient tolerates prosthesis >6 hrs total /day without skin issues or limb pain >5/10 after standing. Baseline: SEE OBJECTIVE DATA Goal status: Partially MET 06/30/2023  3.  Patient able to reach 5" and looks to sides without UE support with supervision. Baseline: SEE OBJECTIVE DATA Goal status: Ongoing 06/30/2023  4. Patient ambulates 74' with RW & prosthesis with supervision. Baseline: SEE OBJECTIVE DATA Goal status: Ongoing 06/30/2023  5. Patient negotiates ramps & curbs with RW & prosthesis with minA. Baseline: SEE OBJECTIVE DATA Goal status: MET 06/23/2023  LONG TERM GOALS: Target date: 08/28/2023  Patient demonstrates & verbalized understanding of prosthetic care to enable safe utilization of prosthesis. Baseline: SEE OBJECTIVE DATA Goal status:    Ongoing 06/30/2023  Patient tolerates prosthesis wear >90% of awake hours without skin or limb pain issues. Baseline: SEE OBJECTIVE DATA Goal status: Ongoing 06/30/2023  Berg Balance >36/56 to indicate lower fall risk Baseline: SEE OBJECTIVE DATA Goal status: Ongoing 06/30/2023  Patient ambulates >300' with prosthesis & LRAD independently Baseline: SEE OBJECTIVE DATA Goal status: Ongoing 06/30/2023  Patient negotiates ramps, curbs & stairs with single rail with prosthesis & LRAD independently. Baseline: SEE OBJECTIVE DATA Goal status: Ongoing 06/30/2023   PLAN:  PT FREQUENCY: 2x/week  PT DURATION: 12 weeks  PLANNED INTERVENTIONS: 97164- PT Re-evaluation, 97110-Therapeutic exercises, 97530- Therapeutic activity, 97112- Neuromuscular re-education, [phone]- Self Care, 95284- Gait training, [phone]- Prosthetic training, Patient/Family education, Balance training, Stair training, Scar mobilization, Vestibular training, DME instructions, and Physical Performance Testing  PLAN FOR NEXT SESSION:  Check remaining 2 STGs,  continue ambulation, curb, and ramp. Check on Activity tolerance of short/med/long walks, assess max distance is.    Date of referral: 04/17/2023  PT eval delayed by COPD exacerbation Referring provider: Aldean Baker, MD Referring diagnosis? Z89.511 (ICD-10-CM) - S/P BKA (below knee amputation), right Treatment diagnosis? (if different than referring diagnosis)   Other abnormalities of gait & mobility R26.89 Unsteadiness on feet R26.81 Pain in left leg M79.605 Pain in right leg M79.604 Muscle Weakness (generalized) M62.81 History of falling Z91.81 Impaired functional mobility, balance, gait and endurance  What was this (referring dx) caused by? Surgery (Type: right BKA)  Nature of Condition: Initial Onset (within last 3 months)  prosthesis delivery 04/01/2023   Laterality: Rt  Current Functional Measure Score: Other Berg Balance 19/56  Objective measurements identify  impairments when they are compared to normal values, the uninvolved extremity, and prior level of function.  [x]  Yes  []  No  Objective assessment of  functional ability: Severe functional limitations   Briefly describe symptoms: dependent in care & use of prosthesis, impaired balance and gait, pain in both legs  How did symptoms start: amputation from wounds  Average pain intensity:  Last 24 hours: 8/10  Past week: 8/10  How often does the pt experience symptoms? Constantly  How much have the symptoms interfered with usual daily activities? Extremely  How has condition changed since care began at this facility? NA - initial visit  In general, how is the patients overall health? Good   BACK PAIN (STarT Back Screening Tool) No   Vladimir Faster, PT, DPT 06/30/2023, 12:44 PM

## 2023-07-02 ENCOUNTER — Encounter: Payer: Medicare Other | Admitting: Physical Therapy

## 2023-07-07 ENCOUNTER — Ambulatory Visit: Payer: Medicare Other | Admitting: Physical Therapy

## 2023-07-07 ENCOUNTER — Encounter: Payer: Self-pay | Admitting: Physical Therapy

## 2023-07-07 DIAGNOSIS — M6281 Muscle weakness (generalized): Secondary | ICD-10-CM

## 2023-07-07 DIAGNOSIS — R2689 Other abnormalities of gait and mobility: Secondary | ICD-10-CM

## 2023-07-07 DIAGNOSIS — M79605 Pain in left leg: Secondary | ICD-10-CM

## 2023-07-07 DIAGNOSIS — Z9181 History of falling: Secondary | ICD-10-CM

## 2023-07-07 DIAGNOSIS — M79604 Pain in right leg: Secondary | ICD-10-CM | POA: Diagnosis not present

## 2023-07-07 DIAGNOSIS — R2681 Unsteadiness on feet: Secondary | ICD-10-CM | POA: Diagnosis not present

## 2023-07-07 DIAGNOSIS — Z7409 Other reduced mobility: Secondary | ICD-10-CM

## 2023-07-07 NOTE — Therapy (Signed)
 OUTPATIENT PHYSICAL THERAPY PROSTHETIC TREATMENT   Patient Name: [name] MRN: [id number] DOB:[birth year], 73 y.o., male Today's Date: 07/07/2023  END OF SESSION:  PT End of Session - 07/07/23 1144     Visit Number 5    Number of Visits 25    Date for PT Re-Evaluation 08/28/23    Authorization Type [employer] Medicare    Progress Note Due on Visit 10    PT Start Time 1145    PT Stop Time 1227    PT Time Calculation (min) 42 min    Equipment Utilized During Treatment Gait belt    Activity Tolerance Patient tolerated treatment well;Patient limited by pain;Patient limited by fatigue    Behavior During Therapy [facility] for tasks assessed/performed                Past Medical History:  Diagnosis Date   Aortic stenosis, mild    Arthritis of hand    "just a little bit in both hands" (03/31/2012)   Asthma    "little bit" (03/31/2012)   Atrial fibrillation (HCC)    dx '04; DCCV '04, placed on flecainide, failed DCCV 04/2010, flecainide stopped; s/p successful A.fib ablation 01/31/12   CHF (congestive heart failure) (HCC)    Controlled type 2 diabetes mellitus without complication, without long-term current use of insulin (HCC) 12/13/2019   COPD (chronic obstructive pulmonary disease) (HCC)    DJD (degenerative joint disease)    Fatty liver    Fibromyalgia    GERD (gastroesophageal reflux disease)    Hyperlipidemia    Hypertension    Hypothyroidism    S/P radiation   Left atrial enlargement    LA size 45mm by echo 11/21/11   Mitral regurgitation    trivial   Obstructive sleep apnea    mild by sleep study 2013; pt stated he does not have a machine because "it wasn't bad enough for him to have one"   Pacemaker 03/31/2012   Pneumonia    last time 2023   Restless leg syndrome    Second degree AV block    Splenomegaly    Past Surgical History:  Procedure Laterality Date   AMPUTATION Right 08/23/2022   Procedure: RIGHT FOOT FIFTH RAY AMPUTATION;  Surgeon: [name], MD;   Location: [facility] OR;  Service: Orthopedics;  Laterality: Right;   AMPUTATION Right 09/04/2022   Procedure: RIGHT BELOW KNEE AMPUTATION;  Surgeon: [name], MD;  Location: [facility] OR;  Service: Orthopedics;  Laterality: Right;   AORTIC VALVE REPLACEMENT N/A 08/22/2015   Procedure: AORTIC VALVE REPLACEMENT (AVR);  Surgeon: Kerin Perna, MD;  Location: [facility] OR;  Service: Open Heart Surgery;  Laterality: N/A;   ATRIAL FIBRILLATION ABLATION  01/30/2012   PVI by Dr. Johney Frame   ATRIAL FIBRILLATION ABLATION N/A 01/31/2012   Procedure: ATRIAL FIBRILLATION ABLATION;  Surgeon: [name], MD;  Location: [facility] CATH LAB;  Service: Cardiovascular;  Laterality: N/A;   ATRIAL FIBRILLATION ABLATION N/A 04/19/2020   Procedure: ATRIAL FIBRILLATION ABLATION;  Surgeon: [name], MD;  Location: [facility] INVASIVE CV LAB;  Service: Cardiovascular;  Laterality: N/A;   BACK SURGERY     X 3   BIOPSY  01/01/2022   Procedure: BIOPSY;  Surgeon: Kathi Der, MD;  Location: [facility] ENDOSCOPY;  Service: Gastroenterology;;   CARDIAC CATHETERIZATION N/A 08/09/2015   Procedure: Right/Left Heart Cath and Coronary Angiography;  Surgeon: Peter M Swaziland, MD;  Location: [facility] INVASIVE CV LAB;  Service: Cardiovascular;  Laterality: N/A;   CARDIAC  CATHETERIZATION N/A 02/05/2016   Procedure: Right/Left Heart Cath and Coronary Angiography;  Surgeon: Corky Crafts, MD;  Location: [facility] INVASIVE CV LAB;  Service: Cardiovascular;  Laterality: N/A;   CARDIAC CATHETERIZATION N/A 04/17/2016   Procedure: Right Heart Cath;  Surgeon: Dolores Patty, MD;  Location: [facility] INVASIVE CV LAB;  Service: Cardiovascular;  Laterality: N/A;   CARDIOVASCULAR STRESS TEST  03/12/2002   EF 48%, NO EVIDENCE OF ISCHEMIA   CARDIOVERSION  01/2012; 03/31/2012   CARDIOVERSION N/A 02/25/2012   Procedure: CARDIOVERSION;  Surgeon: [name], MD;  Location: [facility] CATH LAB;  Service: Cardiovascular;  Laterality: N/A;   CARDIOVERSION N/A 03/31/2012   Procedure: CARDIOVERSION;   Surgeon: [name], MD;  Location: [facility] CATH LAB;  Service: Cardiovascular;  Laterality: N/A;   CARDIOVERSION N/A 11/23/2015   Procedure: CARDIOVERSION;  Surgeon: Laurey Morale, MD;  Location: [facility] ENDOSCOPY;  Service: Cardiovascular;  Laterality: N/A;   CARDIOVERSION N/A 04/08/2016   Procedure: CARDIOVERSION;  Surgeon: Dolores Patty, MD;  Location: [facility] ENDOSCOPY;  Service: Cardiovascular;  Laterality: N/A;   CARDIOVERSION N/A 09/14/2018   Procedure: CARDIOVERSION;  Surgeon: Wendall Stade, MD;  Location: [facility] ENDOSCOPY;  Service: Cardiovascular;  Laterality: N/A;   CARDIOVERSION N/A 10/19/2019   Procedure: CARDIOVERSION;  Surgeon: Dolores Patty, MD;  Location: [facility] ENDOSCOPY;  Service: Cardiovascular;  Laterality: N/A;   CARDIOVERSION N/A 02/25/2020   Procedure: CARDIOVERSION;  Surgeon: Pricilla Riffle, MD;  Location: [facility] ENDOSCOPY;  Service: Cardiovascular;  Laterality: N/A;   CARDIOVERSION N/A 05/25/2020   Procedure: CARDIOVERSION;  Surgeon: Lewayne Bunting, MD;  Location: [facility] ENDOSCOPY;  Service: Cardiovascular;  Laterality: N/A;   CARDIOVERSION N/A 07/10/2020   Procedure: CARDIOVERSION;  Surgeon: Jake Bathe, MD;  Location: [facility] ENDOSCOPY;  Service: Cardiovascular;  Laterality: N/A;   CLIPPING OF ATRIAL APPENDAGE N/A 08/22/2015   Procedure: CLIPPING OF ATRIAL APPENDAGE;  Surgeon: Kerin Perna, MD;  Location: [facility] OR;  Service: Open Heart Surgery;  Laterality: N/A;   COLONOSCOPY WITH PROPOFOL N/A 01/01/2022   Procedure: COLONOSCOPY WITH PROPOFOL;  Surgeon: Kathi Der, MD;  Location: [facility] ENDOSCOPY;  Service: Gastroenterology;  Laterality: N/A;   DOPPLER ECHOCARDIOGRAPHY  03/11/2002   EF 70-75%   FINGER SURGERY Left    Middle finger   FINGER TENDON REPAIR  1980's   "right little finger" (03/31/2012)   FLEXIBLE SIGMOIDOSCOPY N/A 12/30/2021   Procedure: FLEXIBLE SIGMOIDOSCOPY;  Surgeon: Charlott Rakes, MD;  Location: [facility] ENDOSCOPY;  Service: Gastroenterology;  Laterality: N/A;    INSERT / REPLACE / REMOVE PACEMAKER     St Jude   IR RADIOLOGIST EVAL & MGMT  05/06/2019   IR RADIOLOGIST EVAL & MGMT  10/06/2019   IR RADIOLOGIST EVAL & MGMT  10/31/2020   IR RADIOLOGIST EVAL & MGMT  01/18/2021   IR RADIOLOGIST EVAL & MGMT  03/08/2021   IR RADIOLOGIST EVAL & MGMT  06/13/2021   KIDNEY SURGERY Left    LEFT AND RIGHT HEART CATHETERIZATION WITH CORONARY ANGIOGRAM N/A 10/27/2012   Procedure: LEFT AND RIGHT HEART CATHETERIZATION WITH CORONARY ANGIOGRAM;  Surgeon: Pamella Pert, MD;  Location: [facility] CATH LAB;  Service: Cardiovascular;  Laterality: N/A;   LUMBAR DISC SURGERY  1980's X2;  2000's   MAZE N/A 08/22/2015   Procedure: MAZE;  Surgeon: Kerin Perna, MD;  Location: [place] OR;  Service: Open Heart Surgery;  Laterality: N/A;   PACEMAKER INSERTION  03/31/2012   STJ Accent DR pacemaker implanted by Dr Johney Frame   PERMANENT PACEMAKER INSERTION  N/A 03/31/2012   Procedure: PERMANENT PACEMAKER INSERTION;  Surgeon: [name], MD;  Location: [facility] CATH LAB;  Service: Cardiovascular;  Laterality: N/A;   PPM GENERATOR CHANGEOUT N/A 08/08/2020   Procedure: PPM GENERATOR CHANGEOUT;  Surgeon: [name], MD;  Location: [facility] INVASIVE CV LAB;  Service: Cardiovascular;  Laterality: N/A;   RADIOLOGY WITH ANESTHESIA Left 02/14/2021   Procedure: RADIOLOGY WITH ANESTHESIA LEFT RENAL CRYOABLATION;  Surgeon: Irish Lack, MD;  Location: [facility] ORS;  Service: Radiology;  Laterality: Left;   STUMP REVISION Right 10/09/2022   Procedure: REVISION RIGHT BELOW KNEE AMPUTATION;  Surgeon: [name], MD;  Location: [facility] OR;  Service: Orthopedics;  Laterality: Right;   STUMP REVISION Right 11/16/2022   Procedure: REVISION RIGHT BELOW KNEE AMPUTATION;  Surgeon: [name], MD;  Location: [facility] OR;  Service: Orthopedics;  Laterality: Right;   TEE WITHOUT CARDIOVERSION  01/30/2012   Procedure: TRANSESOPHAGEAL ECHOCARDIOGRAM (TEE);  Surgeon: Laurey Morale, MD;  Location: [facility] ENDOSCOPY;  Service:  Cardiovascular;  Laterality: N/A;  ablation next day   TEE WITHOUT CARDIOVERSION N/A 08/22/2015   Procedure: TRANSESOPHAGEAL ECHOCARDIOGRAM (TEE);  Surgeon: Kerin Perna, MD;  Location: [facility] OR;  Service: Open Heart Surgery;  Laterality: N/A;   TEE WITHOUT CARDIOVERSION N/A 10/19/2019   Procedure: TRANSESOPHAGEAL ECHOCARDIOGRAM (TEE);  Surgeon: Dolores Patty, MD;  Location: [facility] ENDOSCOPY;  Service: Cardiovascular;  Laterality: N/A;   US ECHOCARDIOGRAPHY  08/07/2009   EF 55-60%   Patient Active Problem List   Diagnosis Date Noted   AKI (acute kidney injury) (HCC) 12/22/2022   Fall 11/14/2022   Pain of right lower extremity due to injury 11/14/2022   Diabetic ulcer of lower leg (HCC) 11/13/2022   Osteomyelitis (HCC) 11/13/2022   Dyslipidemia 10/18/2022   Cellulitis of right lower extremity 10/17/2022   Dehiscence of amputation stump of right lower extremity (HCC) 10/09/2022   Below-knee amputation of right lower extremity (HCC) 10/09/2022   Gangrene of right foot (HCC) 09/04/2022   History of below-knee amputation of right lower extremity (HCC) 09/04/2022   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) 08/23/2022   CAP (community acquired pneumonia) 12/28/2021   Constipation 12/28/2021   Pain in both lower extremities 07/03/2021   Peripheral neuropathy 07/03/2021   Acute metabolic encephalopathy 06/09/2021   Marijuana dependence (HCC) 06/09/2021   DNR (do not resuscitate) 06/09/2021   Chest pain 06/08/2021   Renal lesion 02/14/2021   Status post cryoablation 02/14/2021   Allergic rhinitis 12/08/2020   Preop pulmonary/respiratory exam 03/27/2020   Acute on chronic diastolic (congestive) heart failure (HCC) 01/22/2020   Abdominal pain 01/22/2020   Hypothyroidism 12/13/2019   DM2 (diabetes mellitus, type 2) (HCC) 12/13/2019   Chronic kidney disease, stage 3b (HCC) 12/13/2019   Acute bacterial bronchitis 12/13/2019   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   Typical atrial flutter  (HCC)    Chronic respiratory failure with hypoxia (HCC) 05/23/2016   CAD (coronary artery disease) 05/23/2016   Persistent atrial fibrillation (HCC)    Atypical atrial flutter (HCC)    Visit for monitoring Tikosyn therapy 04/16/2016   Chronic diastolic CHF (congestive heart failure) (HCC) 02/03/2016   Cough 12/06/2015   Acute on chronic respiratory failure with hypoxia (HCC) 09/08/2015   Hallucinations 09/04/2015   Lymphadenopathy 09/04/2015   Ascending aortic aneurysm (HCC) 09/04/2015   Hypoxia 09/02/2015   S/P aortic valve replacement with bioprosthetic valve 09/02/2015   Pleural effusion 09/02/2015   S/P AVR 08/22/2015   GERD without esophagitis 09/20/2014  Intrinsic asthma 10/13/2013   Dyspnea 09/15/2013   Pulmonary hypertension (HCC) 09/15/2013   Shortness of breath 05/05/2013   Pacemaker-St.Jude 04/01/2012   Bradycardia 03/31/2012   Second degree AV block 03/31/2012   AV block, 1st degree 02/01/2012   PAF (paroxysmal atrial fibrillation) (HCC) 11/13/2011   Fatigue 11/13/2011   Essential hypertension 11/13/2011   Aortic stenosis 11/13/2011   Sleep apnea 11/13/2011    PCP: Alysia Penna, MD  REFERRING PROVIDER: Aldean Baker, MD  ONSET DATE: 04/01/2023 prosthesis delivery  REFERRING DIAG: Z89.511 (ICD-10-CM) - S/P BKA (below knee amputation), right   THERAPY DIAG:  Other abnormalities of gait and mobility  Unsteadiness on feet  Pain in right leg  Pain in left leg  Muscle weakness (generalized)  History of falling  Impaired functional mobility, balance, gait, and endurance  Rationale for Evaluation and Treatment: Rehabilitation  SUBJECTIVE:   SUBJECTIVE STATEMENT: He thinks his left leg is healing.  He is wearing prosthesis 4 hours 2x/day without issues. One day he wore it 8 hours straight and his residual limb calf was sore the next day.  But he also did some plumbing work which may have been part of pain spike.   PERTINENT HISTORY:  right TTA 09/04/22,  DM2, HLD, asthma, COPD, A-Fib, CAD, pacemaker, cardioversion & catheterization, arthritis, Fibromyalgia, GERD, HTN,   PAIN:  Are you having pain? Yes: NPRS scale: 10/10 Right leg, 10/10 left leg Pain location: both legs Pain description: neuropathy, sharp, sticking "big" needles in leg Aggravating factors: meds wearing off,  standing & walking Relieving factors: pain pill 2x/day  Residual limb prosthesis on limb 0/10 - 10/10,  stand or walk 5/10 - 10/10  PRECAUTIONS: Fall and ICD/Pacemaker  WEIGHT BEARING RESTRICTIONS: No  FALLS: Has patient fallen in last 6 months? Yes. Number of falls 1 no injuries  LIVING ENVIRONMENT: Lives with: lives with their spouse and 3 dogs (mini schnauzer 15# indoor & 2 german [place] outdoor)  Lives in: [place] Home Access: Ramped entrance Home layout: One level Stairs: Yes: External: 6 steps; can reach both ramp at primary entrance Has following equipment at home: Single point cane, Environmental consultant - 2 wheeled, Wheelchair (manual), shower chair, Tour manager, and Ramped entry  OCCUPATION: retired from [employer]  PLOF: Independent, Independent with household mobility without device, and Independent with community mobility without device used oxygen as needed  PATIENT GOALS:  use prosthesis to function in community & in house, do odd repairs, yard work  OBJECTIVE:  COGNITION: Overall cognitive status: Within functional limits for tasks assessed   SENSATION: WFL  CARDIOVASCULAR RESPONSE: Functional activity: balance and gait assessments Pre-activity vitals: no oxygen seated in w/c HR: 67 SpO2: 93 Post-activity vitals: Berg with no oxygen  HR: 78 SpO2: 90-91%  94% on oxygen at rest, HR 70 After gait with O2 SpO2 94%, HR 73  Modified Borg scale for dyspnea: 2: mild shortness of breath  POSTURE: rounded shoulders, forward head, flexed trunk , and weight shift left  LOWER EXTREMITY ROM:  ROM P:passive  A:active Right eval Left eval  Hip  flexion    Hip extension standing -10*    Hip abduction    Hip adduction    Hip internal rotation    Hip external rotation    Knee flexion    Knee extension    Ankle dorsiflexion    Ankle plantarflexion    Ankle inversion    Ankle eversion     (Blank rows = not tested)  LOWER EXTREMITY  MMT:  MMT Right eval Left eval  Hip flexion    Hip extension 3-4/5 3-4/5  Hip abduction 3-4/5 4/5  Hip adduction    Hip internal rotation    Hip external rotation    Knee flexion 3-4/5 4/5  Knee extension 3-4/5 4/5  Ankle dorsiflexion    Ankle plantarflexion    Ankle inversion    Ankle eversion    At Evaluation all strength testing is grossly seated and functionally standing / gait. (Blank rows = not tested)  TRANSFERS: Sit to stand: SBA from 20" w/c using BUEs on armrests to arise stabilizes with back of legs against w/c Stand to sit: SBA uses BUEs on armrests of w/c to control descent  FUNCTIONAL TESTs:  Berg Balance Scale: 19/56    GAIT: Gait pattern: step to pattern, decreased step length- Left, decreased stance time- Right, decreased hip/knee flexion- Right, decreased ankle dorsiflexion- Right, Right hip hike, knee flexed in stance- Right, lateral hip instability, trunk flexed, and abducted- Right Distance walked: 25' Assistive device utilized: Environmental consultant - 2 wheeled and TTA prosthesis Level of assistance: Min A balance loss to left on straight path & 180* turn Comments: used O2 with portable unit slung over left shoulder,  pain in residual limb limited distance,   CURRENT PROSTHETIC WEAR ASSESSMENT: 06/03/2023:  Patient is dependent with: skin check, residual limb care, care of non-amputated limb, prosthetic cleaning, ply sock cleaning, correct ply sock adjustment, proper wear schedule/adjustment, and proper weight-bearing schedule/adjustment Donning prosthesis: dependent with PT 100% cues on technique Doffing prosthesis: reports modified independent Prosthetic wear tolerance:  2-4 hours, 1x/day, ~3 days/week Prosthetic weight bearing tolerance: 5 minutes with partial weight on prosthesis with pain increasing to 8/10 (intolerable) Edema: pitting with 7 sec capillary refill Residual limb condition: dry skin especially distal limb, 3 scratches with superficial scab on distal limb, scar healed but adhered over entire area of distal tibia, bulbous shape,  Prosthetic description: silicon liner with pin lock suspension, total contact socket with flexible inner socket, dynamic response foot K code/activity level with prosthetic use: Level 3    TODAY'S TREATMENT:                                                                                                                             DATE:  07/07/2023: Prosthetic Care with TTA prosthesis: -Pt's residual limb has smaller dry scabs on incision.  He is using mineral oil over patella to reduce friction from liner. PT recommended increasing 5 hours 2x/day with off 2 hours with shrinker between. Pt & wife verbalized understanding.  -pt amb 150' with RW with SBA. Verbal cues on upright posture, position within RW and step through pattern.  -PT demo & verbal cues on rollator walker use, safety, benefits and design options.  Pt amb 75' X 2 with rollator walker with supervision. He had improved fluency of motion with control.  Pt able to sit to/from stand from rollator seat with PT lightly  stabilizing rollator walker.  -PT demo & verbal cues on rollator walker use with negotiating ramps & curbs.  Pt neg 6.5" curb and 12* incline with rollator walker with CGA.      TREATMENT:                                                                                                                             DATE:  06/30/2023: Prosthetic Care with TTA prosthesis: -Patient's residual limb has dry scabs on incision that is healing.  PT educated with demo & verbal cues on donning Vivewear compresssion sock. Both verbalized understanding.  -PT  recommended increasing wear to 4-5 hours 2x/day and explained rationale for increasing wear to most of awake hours.  Pt & wife verbalized understanding.  -PT demo & verbal cues on adjusting RW height to facilitate upright posture.  PT demo & verbal cues on step through pattern staying within RW and proper step width using Theraband as visual target.  Pt amb 140' and 100' with CGA carry over of above.  He had 2 balance losses with turning when he got his feet outside RW base.   -pt neg ramp & curb with RW with minA and verbal cues on technique. -PT recommended increasing activity tolerance: short walks in home working to increase to all household mobility with RW gait;  long walk (his max tolerance which may have LE moderate pain) 1-2 times per day / could be starting to walk into community buildings wife following with w/c for when he fatigues;  medium walks (distance more than household but not his max tolerance) may be walking in/out of house to car 4+ / day.  Pt & wife verbalized understanding.    TREATMENT:                                                                                                                             DATE:  06/25/2023: Prosthetic Care with TTA prosthesis: -Patient ambuated CGA with RW max tolerated distance of 50 feet.  VC for looking up when ambulating and proper positioning and sequencing of RW push. Then performed one more round of 30 feet -Leg press DL 40# J81, then DL 19# X 15, then single leg 25# X 10 bilat. Cues and considerations for performing with prosthesis -Standing HEP at sink, performed trial set of each, see below for details and specifics.   06/23/2023: Prosthetic Care with TTA  prosthesis: -Patient ambuated CGA with RW 2ft + 56ft with seated rest break and step (curb) / ramp negotiation between. VC for looking up when ambulating and proper positioning and sequencing of RW push. CGA for stand>sit 18in chair w/o armrest with patient using RW heavily with  semicontrolled descent. Sit>stand required 2 attempts with patient attempting to use BUE on walker to rise. VC and explanation on proper sequencing for correction of transfer. Patient verbalizing understanding. -6in curb minA / CGA with VC for proper placement of walker. When descending pt had instance of R knee buckling before attempting to lower walker, also demonstrated increased posterior lean with patient able to self correct with vc -ramp x2 bouts; 1st bout: ascending- CGA with heavy VC for sequencing of walker and positioning within walker as well as smaller steps. Descending- vc for proper placement of UE on walker, positioning within walker, and sequencing of walker. 2nd bout: CGA with VC for progressing walker, patient able to step appropriately. Desceding- patient had 1 instance of R knee buckling requiring minA for prevention of fall, vc for proper distance between self and walker for safety and to catch himself if knee buckles.  -PT educated pt & wife in target to wear prosthesis all awake hours.  PT recommended 3-4 hours 2x/day to improve overall wear but have a break between for limiting residual limb discomfort. Both verbalized understanding.    Patient/wife given tennis balls for his own RW to assist with ambulation and ease of pushing walker.     PATIENT EDUCATION: PATIENT EDUCATED ON FOLLOWING PROSTHETIC CARE: Education details:  Skin check, Residual limb care, Prosthetic cleaning, Correct ply sock adjustment, Propper donning, and Proper wear schedule/adjustment Prosthetic wear tolerance: 2 hours 2x/day, 7 days/week Person educated: Patient and Spouse Education method: Explanation, Demonstration, Tactile cues, and Verbal cues Education comprehension: verbalized understanding, verbal cues required, tactile cues required, and needs further education  HOME EXERCISE PROGRAM: Printed and issued 06/25/23 Do each exercise 1-2  times per day Do each exercise 5-10 repetitions Hold each  exercise for 2 seconds to feel your location  AT SINK FIND YOUR MIDLINE POSITION AND PLACE FEET EQUAL DISTANCE FROM THE MIDLINE.  Try to find this position when standing still for activities.   USE TAPE ON FLOOR TO MARK THE MIDLINE POSITION which is even with middle of sink.  You also should try to feel with your limb pressure in socket.  You are trying to feel with limb what you used to feel with the bottom of your foot.  Side to Side Shift: Moving your hips only (not shoulders): move weight onto your left leg, HOLD/FEEL pressure in socket.  Move back to equal weight on each leg, HOLD/FEEL pressure in socket. Move weight onto your right leg, HOLD/FEEL pressure in socket. Move back to equal weight on each leg, HOLD/FEEL pressure in socket. Repeat.  Start with both hands on sink, progress to hand on prosthetic side only, then no hands.  Front to Back Shift: Moving your hips only (not shoulders): move your weight forward onto your toes, HOLD/FEEL pressure in socket. Move your weight back to equal Flat Foot on both legs, HOLD/FEEL  pressure in socket. Move your weight back onto your heels, HOLD/FEEL  pressure in socket. Move your weight back to equal on both legs, HOLD/FEEL  pressure in socket. Repeat.  Start with both hands on sink, progress to hand on prosthetic side only, then no hands.  Moving Cones / Cups: With equal weight on each leg:  Hold on with one hand the first time, then progress to no hand supports. Move cups from one side of sink to the other. Place cups ~2" out of your reach, progress to 10" beyond reach.  Place one hand in middle of sink and reach with other hand. Do both arms.  Then hover one hand and move cups with other hand.  Overhead/Upward Reaching: alternated reaching up to top cabinets or ceiling if no cabinets present. Keep equal weight on each leg. Start with one hand support on counter while other hand reaches and progress to no hand support with reaching.  ace one hand in middle  of sink and reach with other hand. Do both arms.  Then hover one hand and move cups with other hand.  5.   Looking Over Shoulders: With equal weight on each leg: alternate turning to look over your shoulders with one hand support on counter as needed.  Start with head motions only to look in front of shoulder, then even with shoulder and progress to looking behind you. To look to side, move head /eyes, then shoulder on side looking pulls back, shift more weight to side looking and pull hip back. Place one hand in middle of sink and let go with other hand so your shoulder can pull back. Switch hands to look other way.   Then hover one hand and look over shoulder. If looking right, use left hand at sink. If looking left, use right hand at sink. 6.  Stepping with leg that is not amputated:  Move items under cabinet out of your way. Shift your hips/pelvis so weight on prosthesis. Tighten muscles in hip on prosthetic side.  SLOWLY step other leg so front of foot is in cabinet. Then step back to floor.   ASSESSMENT:  CLINICAL IMPRESSION: PT introduced use of rollator walker today which he appears generally safe but would benefit from another session of practice.  Using a rollator walker would help with function especially 10" wheels on uneven terrain.   Patient continues to benefit from skilled PT.    OBJECTIVE IMPAIRMENTS: Abnormal gait, cardiopulmonary status limiting activity, decreased activity tolerance, decreased balance, decreased endurance, decreased knowledge of condition, decreased knowledge of use of DME, decreased mobility, difficulty walking, decreased ROM, decreased strength, increased edema, prosthetic dependency , and pain.   ACTIVITY LIMITATIONS: carrying, lifting, bending, standing, stairs, transfers, and locomotion level  PARTICIPATION LIMITATIONS: meal prep, cleaning, driving, community activity, and yard work  PERSONAL FACTORS: Fitness, Time since onset of injury/illness/exacerbation,  and 3+ comorbidities: see PMH  are also affecting patient's functional outcome.   REHAB POTENTIAL: Good  CLINICAL DECISION MAKING: Evolving/moderate complexity  EVALUATION COMPLEXITY: Moderate   GOALS: Goals reviewed with patient? Yes  SHORT TERM GOALS: Target date: 07/02/2023  Patient donnes prosthesis modified independent & verbalizes proper cleaning. Baseline: SEE OBJECTIVE DATA Goal status:   MET 06/30/2023 2.  Patient tolerates prosthesis >6 hrs total /day without skin issues or limb pain >5/10 after standing. Baseline: SEE OBJECTIVE DATA Goal status: Partially MET 06/30/2023  3.  Patient able to reach 5" and looks to sides without UE support with supervision. Baseline: SEE OBJECTIVE DATA Goal status: MET 07/07/2023  4. Patient ambulates 77' with RW & prosthesis with supervision. Baseline: SEE OBJECTIVE DATA Goal status: MET 07/07/2023  5. Patient negotiates ramps & curbs with RW & prosthesis with minA. Baseline: SEE OBJECTIVE DATA Goal status: MET 06/23/2023  LONG TERM GOALS: Target date: 08/28/2023  Patient demonstrates & verbalized understanding  of prosthetic care to enable safe utilization of prosthesis. Baseline: SEE OBJECTIVE DATA Goal status:   Ongoing 06/30/2023  Patient tolerates prosthesis wear >90% of awake hours without skin or limb pain issues. Baseline: SEE OBJECTIVE DATA Goal status: Ongoing 06/30/2023  Berg Balance >36/56 to indicate lower fall risk Baseline: SEE OBJECTIVE DATA Goal status: Ongoing 06/30/2023  Patient ambulates >300' with prosthesis & LRAD independently Baseline: SEE OBJECTIVE DATA Goal status: Ongoing 06/30/2023  Patient negotiates ramps, curbs & stairs with single rail with prosthesis & LRAD independently. Baseline: SEE OBJECTIVE DATA Goal status: Ongoing 06/30/2023   PLAN:  PT FREQUENCY: 2x/week  PT DURATION: 12 weeks  PLANNED INTERVENTIONS: 97164- PT Re-evaluation, 97110-Therapeutic exercises, 97530- Therapeutic activity, 97112-  Neuromuscular re-education, [phone]- Self Care, 78469- Gait training, [phone]- Prosthetic training, Patient/Family education, Balance training, Stair training, Scar mobilization, Vestibular training, DME instructions, and Physical Performance Testing  PLAN FOR NEXT SESSION:  continue ambulation, curb, and ramp with rollator walker. Check on Activity tolerance of short/med/long walks   Date of referral: 04/17/2023  PT eval delayed by COPD exacerbation Referring provider: Aldean Baker, MD Referring diagnosis? Z89.511 (ICD-10-CM) - S/P BKA (below knee amputation), right Treatment diagnosis? (if different than referring diagnosis)   Other abnormalities of gait & mobility R26.89 Unsteadiness on feet R26.81 Pain in left leg M79.605 Pain in right leg M79.604 Muscle Weakness (generalized) M62.81 History of falling Z91.81 Impaired functional mobility, balance, gait and endurance  What was this (referring dx) caused by? Surgery (Type: right BKA)  Nature of Condition: Initial Onset (within last 3 months)  prosthesis delivery 04/01/2023   Laterality: Rt  Current Functional Measure Score: Other Berg Balance 19/56  Objective measurements identify impairments when they are compared to normal values, the uninvolved extremity, and prior level of function.  [x]  Yes  []  No  Objective assessment of functional ability: Severe functional limitations   Briefly describe symptoms: dependent in care & use of prosthesis, impaired balance and gait, pain in both legs  How did symptoms start: amputation from wounds  Average pain intensity:  Last 24 hours: 8/10  Past week: 8/10  How often does the pt experience symptoms? Constantly  How much have the symptoms interfered with usual daily activities? Extremely  How has condition changed since care began at this facility? NA - initial visit  In general, how is the patients overall health? Good   BACK PAIN (STarT Back Screening Tool) No   Vladimir Faster,  PT, DPT 07/07/2023, 12:58 PM

## 2023-07-09 ENCOUNTER — Encounter: Payer: Self-pay | Admitting: Physical Therapy

## 2023-07-09 ENCOUNTER — Ambulatory Visit: Payer: Medicare Other | Admitting: Physical Therapy

## 2023-07-09 DIAGNOSIS — M79604 Pain in right leg: Secondary | ICD-10-CM

## 2023-07-09 DIAGNOSIS — Z9181 History of falling: Secondary | ICD-10-CM

## 2023-07-09 DIAGNOSIS — R2681 Unsteadiness on feet: Secondary | ICD-10-CM

## 2023-07-09 DIAGNOSIS — Z7409 Other reduced mobility: Secondary | ICD-10-CM

## 2023-07-09 DIAGNOSIS — R2689 Other abnormalities of gait and mobility: Secondary | ICD-10-CM

## 2023-07-09 DIAGNOSIS — M79605 Pain in left leg: Secondary | ICD-10-CM

## 2023-07-09 DIAGNOSIS — M6281 Muscle weakness (generalized): Secondary | ICD-10-CM

## 2023-07-09 NOTE — Therapy (Signed)
 OUTPATIENT PHYSICAL THERAPY PROSTHETIC TREATMENT   Patient Name: [name] MRN: [id number] DOB:[birth year], 73 y.o., male Today's Date: 07/09/2023  END OF SESSION:  PT End of Session - 07/09/23 1148     Visit Number 6    Number of Visits 25    Date for PT Re-Evaluation 08/28/23    Authorization Type [employer]    Progress Note Due on Visit 10    PT Start Time 1145    PT Stop Time 1229    PT Time Calculation (min) 44 min    Equipment Utilized During Treatment Gait belt    Activity Tolerance Patient tolerated treatment well;Patient limited by pain;Patient limited by fatigue    Behavior During Therapy [facility] for tasks assessed/performed                 Past Medical History:  Diagnosis Date   Aortic stenosis, mild    Arthritis of hand    "just a little bit in both hands" (03/31/2012)   Asthma    "little bit" (03/31/2012)   Atrial fibrillation (HCC)    dx '04; DCCV '04, placed on flecainide, failed DCCV 04/2010, flecainide stopped; s/p successful A.fib ablation 01/31/12   CHF (congestive heart failure) (HCC)    Controlled type 2 diabetes mellitus without complication, without long-term current use of insulin (HCC) 12/13/2019   COPD (chronic obstructive pulmonary disease) (HCC)    DJD (degenerative joint disease)    Fatty liver    Fibromyalgia    GERD (gastroesophageal reflux disease)    Hyperlipidemia    Hypertension    Hypothyroidism    S/P radiation   Left atrial enlargement    LA size 45mm by echo 11/21/11   Mitral regurgitation    trivial   Obstructive sleep apnea    mild by sleep study 2013; pt stated he does not have a machine because "it wasn't bad enough for him to have one"   Pacemaker 03/31/2012   Pneumonia    last time 2023   Restless leg syndrome    Second degree AV block    Splenomegaly    Past Surgical History:  Procedure Laterality Date   AMPUTATION Right 08/23/2022   Procedure: RIGHT FOOT FIFTH RAY AMPUTATION;  Surgeon: [name],  MD;  Location: [facility] OR;  Service: Orthopedics;  Laterality: Right;   AMPUTATION Right 09/04/2022   Procedure: RIGHT BELOW KNEE AMPUTATION;  Surgeon: [name], MD;  Location: [facility] OR;  Service: Orthopedics;  Laterality: Right;   AORTIC VALVE REPLACEMENT N/A 08/22/2015   Procedure: AORTIC VALVE REPLACEMENT (AVR);  Surgeon: Kerin Perna, MD;  Location: [facility] OR;  Service: Open Heart Surgery;  Laterality: N/A;   ATRIAL FIBRILLATION ABLATION  01/30/2012   PVI by Dr. Johney Frame   ATRIAL FIBRILLATION ABLATION N/A 01/31/2012   Procedure: ATRIAL FIBRILLATION ABLATION;  Surgeon: [name], MD;  Location: [facility] CATH LAB;  Service: Cardiovascular;  Laterality: N/A;   ATRIAL FIBRILLATION ABLATION N/A 04/19/2020   Procedure: ATRIAL FIBRILLATION ABLATION;  Surgeon: [name], MD;  Location: [facility] INVASIVE CV LAB;  Service: Cardiovascular;  Laterality: N/A;   BACK SURGERY     X 3   BIOPSY  01/01/2022   Procedure: BIOPSY;  Surgeon: Kathi Der, MD;  Location: [facility] ENDOSCOPY;  Service: Gastroenterology;;   CARDIAC CATHETERIZATION N/A 08/09/2015   Procedure: Right/Left Heart Cath and Coronary Angiography;  Surgeon: Peter M Swaziland, MD;  Location: [facility] INVASIVE CV LAB;  Service: Cardiovascular;  Laterality: N/A;  CARDIAC CATHETERIZATION N/A 02/05/2016   Procedure: Right/Left Heart Cath and Coronary Angiography;  Surgeon: Corky Crafts, MD;  Location: [facility] INVASIVE CV LAB;  Service: Cardiovascular;  Laterality: N/A;   CARDIAC CATHETERIZATION N/A 04/17/2016   Procedure: Right Heart Cath;  Surgeon: Dolores Patty, MD;  Location: [facility] INVASIVE CV LAB;  Service: Cardiovascular;  Laterality: N/A;   CARDIOVASCULAR STRESS TEST  03/12/2002   EF 48%, NO EVIDENCE OF ISCHEMIA   CARDIOVERSION  01/2012; 03/31/2012   CARDIOVERSION N/A 02/25/2012   Procedure: CARDIOVERSION;  Surgeon: [name], MD;  Location: [facility] CATH LAB;  Service: Cardiovascular;  Laterality: N/A;   CARDIOVERSION N/A 03/31/2012   Procedure:  CARDIOVERSION;  Surgeon: [name], MD;  Location: [facility] CATH LAB;  Service: Cardiovascular;  Laterality: N/A;   CARDIOVERSION N/A 11/23/2015   Procedure: CARDIOVERSION;  Surgeon: Laurey Morale, MD;  Location: [facility] ENDOSCOPY;  Service: Cardiovascular;  Laterality: N/A;   CARDIOVERSION N/A 04/08/2016   Procedure: CARDIOVERSION;  Surgeon: Dolores Patty, MD;  Location: [facility] ENDOSCOPY;  Service: Cardiovascular;  Laterality: N/A;   CARDIOVERSION N/A 09/14/2018   Procedure: CARDIOVERSION;  Surgeon: Wendall Stade, MD;  Location: [facility] ENDOSCOPY;  Service: Cardiovascular;  Laterality: N/A;   CARDIOVERSION N/A 10/19/2019   Procedure: CARDIOVERSION;  Surgeon: Dolores Patty, MD;  Location: [facility] ENDOSCOPY;  Service: Cardiovascular;  Laterality: N/A;   CARDIOVERSION N/A 02/25/2020   Procedure: CARDIOVERSION;  Surgeon: Pricilla Riffle, MD;  Location: [facility] ENDOSCOPY;  Service: Cardiovascular;  Laterality: N/A;   CARDIOVERSION N/A 05/25/2020   Procedure: CARDIOVERSION;  Surgeon: Lewayne Bunting, MD;  Location: [facility] ENDOSCOPY;  Service: Cardiovascular;  Laterality: N/A;   CARDIOVERSION N/A 07/10/2020   Procedure: CARDIOVERSION;  Surgeon: Jake Bathe, MD;  Location: [facility] ENDOSCOPY;  Service: Cardiovascular;  Laterality: N/A;   CLIPPING OF ATRIAL APPENDAGE N/A 08/22/2015   Procedure: CLIPPING OF ATRIAL APPENDAGE;  Surgeon: Kerin Perna, MD;  Location: [facility] OR;  Service: Open Heart Surgery;  Laterality: N/A;   COLONOSCOPY WITH PROPOFOL N/A 01/01/2022   Procedure: COLONOSCOPY WITH PROPOFOL;  Surgeon: Kathi Der, MD;  Location: [facility] ENDOSCOPY;  Service: Gastroenterology;  Laterality: N/A;   DOPPLER ECHOCARDIOGRAPHY  03/11/2002   EF 70-75%   FINGER SURGERY Left    Middle finger   FINGER TENDON REPAIR  1980's   "right little finger" (03/31/2012)   FLEXIBLE SIGMOIDOSCOPY N/A 12/30/2021   Procedure: FLEXIBLE SIGMOIDOSCOPY;  Surgeon: Charlott Rakes, MD;  Location: [facility] ENDOSCOPY;  Service: Gastroenterology;   Laterality: N/A;   INSERT / REPLACE / REMOVE PACEMAKER     St Jude   IR RADIOLOGIST EVAL & MGMT  05/06/2019   IR RADIOLOGIST EVAL & MGMT  10/06/2019   IR RADIOLOGIST EVAL & MGMT  10/31/2020   IR RADIOLOGIST EVAL & MGMT  01/18/2021   IR RADIOLOGIST EVAL & MGMT  03/08/2021   IR RADIOLOGIST EVAL & MGMT  06/13/2021   KIDNEY SURGERY Left    LEFT AND RIGHT HEART CATHETERIZATION WITH CORONARY ANGIOGRAM N/A 10/27/2012   Procedure: LEFT AND RIGHT HEART CATHETERIZATION WITH CORONARY ANGIOGRAM;  Surgeon: Pamella Pert, MD;  Location: [facility] CATH LAB;  Service: Cardiovascular;  Laterality: N/A;   LUMBAR DISC SURGERY  1980's X2;  2000's   MAZE N/A 08/22/2015   Procedure: MAZE;  Surgeon: Kerin Perna, MD;  Location: [facility] OR;  Service: Open Heart Surgery;  Laterality: N/A;   PACEMAKER INSERTION  03/31/2012   STJ Accent DR pacemaker implanted by Dr Johney Frame   PERMANENT PACEMAKER  INSERTION N/A 03/31/2012   Procedure: PERMANENT PACEMAKER INSERTION;  Surgeon: [name], MD;  Location: [facility] CATH LAB;  Service: Cardiovascular;  Laterality: N/A;   PPM GENERATOR CHANGEOUT N/A 08/08/2020   Procedure: PPM GENERATOR CHANGEOUT;  Surgeon: [name], MD;  Location: [facility] INVASIVE CV LAB;  Service: Cardiovascular;  Laterality: N/A;   RADIOLOGY WITH ANESTHESIA Left 02/14/2021   Procedure: RADIOLOGY WITH ANESTHESIA LEFT RENAL CRYOABLATION;  Surgeon: Irish Lack, MD;  Location: [facility] ORS;  Service: Radiology;  Laterality: Left;   STUMP REVISION Right 10/09/2022   Procedure: REVISION RIGHT BELOW KNEE AMPUTATION;  Surgeon: [name], MD;  Location: [facility] OR;  Service: Orthopedics;  Laterality: Right;   STUMP REVISION Right 11/16/2022   Procedure: REVISION RIGHT BELOW KNEE AMPUTATION;  Surgeon: [name], MD;  Location: [facility] OR;  Service: Orthopedics;  Laterality: Right;   TEE WITHOUT CARDIOVERSION  01/30/2012   Procedure: TRANSESOPHAGEAL ECHOCARDIOGRAM (TEE);  Surgeon: Laurey Morale, MD;  Location: [facility] ENDOSCOPY;   Service: Cardiovascular;  Laterality: N/A;  ablation next day   TEE WITHOUT CARDIOVERSION N/A 08/22/2015   Procedure: TRANSESOPHAGEAL ECHOCARDIOGRAM (TEE);  Surgeon: Kerin Perna, MD;  Location: [facility] OR;  Service: Open Heart Surgery;  Laterality: N/A;   TEE WITHOUT CARDIOVERSION N/A 10/19/2019   Procedure: TRANSESOPHAGEAL ECHOCARDIOGRAM (TEE);  Surgeon: Dolores Patty, MD;  Location: [facility] ENDOSCOPY;  Service: Cardiovascular;  Laterality: N/A;   US ECHOCARDIOGRAPHY  08/07/2009   EF 55-60%   Patient Active Problem List   Diagnosis Date Noted   AKI (acute kidney injury) (HCC) 12/22/2022   Fall 11/14/2022   Pain of right lower extremity due to injury 11/14/2022   Diabetic ulcer of lower leg (HCC) 11/13/2022   Osteomyelitis (HCC) 11/13/2022   Dyslipidemia 10/18/2022   Cellulitis of right lower extremity 10/17/2022   Dehiscence of amputation stump of right lower extremity (HCC) 10/09/2022   Below-knee amputation of right lower extremity (HCC) 10/09/2022   Gangrene of right foot (HCC) 09/04/2022   History of below-knee amputation of right lower extremity (HCC) 09/04/2022   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) 08/23/2022   CAP (community acquired pneumonia) 12/28/2021   Constipation 12/28/2021   Pain in both lower extremities 07/03/2021   Peripheral neuropathy 07/03/2021   Acute metabolic encephalopathy 06/09/2021   Marijuana dependence (HCC) 06/09/2021   DNR (do not resuscitate) 06/09/2021   Chest pain 06/08/2021   Renal lesion 02/14/2021   Status post cryoablation 02/14/2021   Allergic rhinitis 12/08/2020   Preop pulmonary/respiratory exam 03/27/2020   Acute on chronic diastolic (congestive) heart failure (HCC) 01/22/2020   Abdominal pain 01/22/2020   Hypothyroidism 12/13/2019   DM2 (diabetes mellitus, type 2) (HCC) 12/13/2019   Chronic kidney disease, stage 3b (HCC) 12/13/2019   Acute bacterial bronchitis 12/13/2019   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   Typical atrial  flutter (HCC)    Chronic respiratory failure with hypoxia (HCC) 05/23/2016   CAD (coronary artery disease) 05/23/2016   Persistent atrial fibrillation (HCC)    Atypical atrial flutter (HCC)    Visit for monitoring Tikosyn therapy 04/16/2016   Chronic diastolic CHF (congestive heart failure) (HCC) 02/03/2016   Cough 12/06/2015   Acute on chronic respiratory failure with hypoxia (HCC) 09/08/2015   Hallucinations 09/04/2015   Lymphadenopathy 09/04/2015   Ascending aortic aneurysm (HCC) 09/04/2015   Hypoxia 09/02/2015   S/P aortic valve replacement with bioprosthetic valve 09/02/2015   Pleural effusion 09/02/2015   S/P AVR 08/22/2015   GERD without esophagitis 09/20/2014  Intrinsic asthma 10/13/2013   Dyspnea 09/15/2013   Pulmonary hypertension (HCC) 09/15/2013   Shortness of breath 05/05/2013   Pacemaker-St.Jude 04/01/2012   Bradycardia 03/31/2012   Second degree AV block 03/31/2012   AV block, 1st degree 02/01/2012   PAF (paroxysmal atrial fibrillation) (HCC) 11/13/2011   Fatigue 11/13/2011   Essential hypertension 11/13/2011   Aortic stenosis 11/13/2011   Sleep apnea 11/13/2011    PCP: Alysia Penna, MD  REFERRING PROVIDER: Aldean Baker, MD  ONSET DATE: 04/01/2023 prosthesis delivery  REFERRING DIAG: Z89.511 (ICD-10-CM) - S/P BKA (below knee amputation), right   THERAPY DIAG:  Other abnormalities of gait and mobility  Unsteadiness on feet  Pain in right leg  Pain in left leg  Muscle weakness (generalized)  History of falling  Impaired functional mobility, balance, gait, and endurance  Rationale for Evaluation and Treatment: Rehabilitation  SUBJECTIVE:   SUBJECTIVE STATEMENT: They have not ordered the rollator walker but plans to order it.  He walked  PERTINENT HISTORY:  right TTA 09/04/22, DM2, HLD, asthma, COPD, A-Fib, CAD, pacemaker, cardioversion & catheterization, arthritis, Fibromyalgia, GERD, HTN,   PAIN:  Are you having pain? Yes: NPRS scale:   10/10 Right leg, 10/10 left leg Pain location: both legs Pain description: neuropathy, sharp, sticking "big" needles in leg Aggravating factors: meds wearing off,  standing & walking Relieving factors: pain pill 2x/day  Residual limb prosthesis on limb 0/10 - 10/10,  stand or walk 5/10 - 10/10  PRECAUTIONS: Fall and ICD/Pacemaker  WEIGHT BEARING RESTRICTIONS: No  FALLS: Has patient fallen in last 6 months? Yes. Number of falls 1 no injuries  LIVING ENVIRONMENT: Lives with: lives with their spouse and 3 dogs (mini schnauzer 15# indoor & 2 german [place] outdoor)  Lives in: [place] Access: Ramped entrance Home layout: One level Stairs: Yes: External: 6 steps; can reach both ramp at primary entrance Has following equipment at home: Single point cane, Environmental consultant - 2 wheeled, Wheelchair (manual), shower chair, Tour manager, and Ramped entry  OCCUPATION: retired from [employer]  PLOF: Independent, Independent with household mobility without device, and Independent with community mobility without device used oxygen as needed  PATIENT GOALS:  use prosthesis to function in community & in house, do odd repairs, yard work  OBJECTIVE:  COGNITION: Overall cognitive status: Within functional limits for tasks assessed   SENSATION: WFL  CARDIOVASCULAR RESPONSE: Functional activity: balance and gait assessments Pre-activity vitals: no oxygen seated in w/c HR: 67 SpO2: 93 Post-activity vitals: Berg with no oxygen  HR: 78 SpO2: 90-91%  94% on oxygen at rest, HR 70 After gait with O2 SpO2 94%, HR 73  Modified Borg scale for dyspnea: 2: mild shortness of breath  POSTURE: rounded shoulders, forward head, flexed trunk , and weight shift left  LOWER EXTREMITY ROM:  ROM P:passive  A:active Right eval Left eval  Hip flexion    Hip extension standing -10*    Hip abduction    Hip adduction    Hip internal rotation    Hip external rotation    Knee flexion    Knee extension     Ankle dorsiflexion    Ankle plantarflexion    Ankle inversion    Ankle eversion     (Blank rows = not tested)  LOWER EXTREMITY MMT:  MMT Right eval Left eval  Hip flexion    Hip extension 3-4/5 3-4/5  Hip abduction 3-4/5 4/5  Hip adduction    Hip internal rotation    Hip external  rotation    Knee flexion 3-4/5 4/5  Knee extension 3-4/5 4/5  Ankle dorsiflexion    Ankle plantarflexion    Ankle inversion    Ankle eversion    At Evaluation all strength testing is grossly seated and functionally standing / gait. (Blank rows = not tested)  TRANSFERS: Sit to stand: SBA from 20" w/c using BUEs on armrests to arise stabilizes with back of legs against w/c Stand to sit: SBA uses BUEs on armrests of w/c to control descent  FUNCTIONAL TESTs:  Berg Balance Scale: 19/56    GAIT: Gait pattern: step to pattern, decreased step length- Left, decreased stance time- Right, decreased hip/knee flexion- Right, decreased ankle dorsiflexion- Right, Right hip hike, knee flexed in stance- Right, lateral hip instability, trunk flexed, and abducted- Right Distance walked: 25' Assistive device utilized: Environmental consultant - 2 wheeled and TTA prosthesis Level of assistance: Min A balance loss to left on straight path & 180* turn Comments: used O2 with portable unit slung over left shoulder,  pain in residual limb limited distance,   CURRENT PROSTHETIC WEAR ASSESSMENT: 06/03/2023:  Patient is dependent with: skin check, residual limb care, care of non-amputated limb, prosthetic cleaning, ply sock cleaning, correct ply sock adjustment, proper wear schedule/adjustment, and proper weight-bearing schedule/adjustment Donning prosthesis: dependent with PT 100% cues on technique Doffing prosthesis: reports modified independent Prosthetic wear tolerance: 2-4 hours, 1x/day, ~3 days/week Prosthetic weight bearing tolerance: 5 minutes with partial weight on prosthesis with pain increasing to 8/10 (intolerable) Edema:  pitting with 7 sec capillary refill Residual limb condition: dry skin especially distal limb, 3 scratches with superficial scab on distal limb, scar healed but adhered over entire area of distal tibia, bulbous shape,  Prosthetic description: silicon liner with pin lock suspension, total contact socket with flexible inner socket, dynamic response foot K code/activity level with prosthetic use: Level 3    TODAY'S TREATMENT:                                                                                                                             DATE:  07/09/2023: Prosthetic Care with TTA prosthesis: No issues with residual limb.  PT recommended wearing prosthesis from arising to bedtime except 2 hours midday.  PT educated on using prosthesis to enter / exit for shower.  Pt & wife verbalized understanding.  PT educated pt & wife verbal & demo in options for driving with Rt TTA.  Heavy recommendation to practice in middle of large empty parking lot. Pt & wife verbalized understanding.  Pt amb with rollator walker 100' and neg ramp/curb with sit/stand from rollator seat. Verbal cues only needed on position within rollator and to lock brakes for ramps/curbs.   PT demo & verbal cues on neg stairs.  Side stepping with BUEs on single rail: 4 steps with each rail with supervision.  Pt & wife verbalized understanding.    TREATMENT:  DATE:  07/07/2023: Prosthetic Care with TTA prosthesis: -Pt's residual limb has smaller dry scabs on incision.  He is using mineral oil over patella to reduce friction from liner. PT recommended increasing 5 hours 2x/day with off 2 hours with shrinker between. Pt & wife verbalized understanding.  -pt amb 150' with RW with SBA. Verbal cues on upright posture, position within RW and step through pattern.  -PT demo & verbal cues on rollator walker use, safety,  benefits and design options.  Pt amb 75' X 2 with rollator walker with supervision. He had improved fluency of motion with control.  Pt able to sit to/from stand from rollator seat with PT lightly stabilizing rollator walker.  -PT demo & verbal cues on rollator walker use with negotiating ramps & curbs.  Pt neg 6.5" curb and 12* incline with rollator walker with CGA.      TREATMENT:                                                                                                                             DATE:  06/30/2023: Prosthetic Care with TTA prosthesis: -Patient's residual limb has dry scabs on incision that is healing.  PT educated with demo & verbal cues on donning Vivewear compresssion sock. Both verbalized understanding.  -PT recommended increasing wear to 4-5 hours 2x/day and explained rationale for increasing wear to most of awake hours.  Pt & wife verbalized understanding.  -PT demo & verbal cues on adjusting RW height to facilitate upright posture.  PT demo & verbal cues on step through pattern staying within RW and proper step width using Theraband as visual target.  Pt amb 140' and 100' with CGA carry over of above.  He had 2 balance losses with turning when he got his feet outside RW base.   -pt neg ramp & curb with RW with minA and verbal cues on technique. -PT recommended increasing activity tolerance: short walks in home working to increase to all household mobility with RW gait;  long walk (his max tolerance which may have LE moderate pain) 1-2 times per day / could be starting to walk into community buildings wife following with w/c for when he fatigues;  medium walks (distance more than household but not his max tolerance) may be walking in/out of house to car 4+ / day.  Pt & wife verbalized understanding.    TREATMENT:  DATE:  06/25/2023: Prosthetic Care  with TTA prosthesis: -Patient ambuated CGA with RW max tolerated distance of 50 feet.  VC for looking up when ambulating and proper positioning and sequencing of RW push. Then performed one more round of 30 feet -Leg press DL 46# N62, then DL 95# X 15, then single leg 25# X 10 bilat. Cues and considerations for performing with prosthesis -Standing HEP at sink, performed trial set of each, see below for details and specifics.     PATIENT EDUCATION: PATIENT EDUCATED ON FOLLOWING PROSTHETIC CARE: Education details:  Skin check, Residual limb care, Prosthetic cleaning, Correct ply sock adjustment, Propper donning, and Proper wear schedule/adjustment Prosthetic wear tolerance: 2 hours 2x/day, 7 days/week Person educated: Patient and Spouse Education method: Explanation, Demonstration, Tactile cues, and Verbal cues Education comprehension: verbalized understanding, verbal cues required, tactile cues required, and needs further education  HOME EXERCISE PROGRAM: Printed and issued 06/25/23 Do each exercise 1-2  times per day Do each exercise 5-10 repetitions Hold each exercise for 2 seconds to feel your location  AT SINK FIND YOUR MIDLINE POSITION AND PLACE FEET EQUAL DISTANCE FROM THE MIDLINE.  Try to find this position when standing still for activities.   USE TAPE ON FLOOR TO MARK THE MIDLINE POSITION which is even with middle of sink.  You also should try to feel with your limb pressure in socket.  You are trying to feel with limb what you used to feel with the bottom of your foot.  Side to Side Shift: Moving your hips only (not shoulders): move weight onto your left leg, HOLD/FEEL pressure in socket.  Move back to equal weight on each leg, HOLD/FEEL pressure in socket. Move weight onto your right leg, HOLD/FEEL pressure in socket. Move back to equal weight on each leg, HOLD/FEEL pressure in socket. Repeat.  Start with both hands on sink, progress to hand on prosthetic side only, then no hands.   Front to Back Shift: Moving your hips only (not shoulders): move your weight forward onto your toes, HOLD/FEEL pressure in socket. Move your weight back to equal Flat Foot on both legs, HOLD/FEEL  pressure in socket. Move your weight back onto your heels, HOLD/FEEL  pressure in socket. Move your weight back to equal on both legs, HOLD/FEEL  pressure in socket. Repeat.  Start with both hands on sink, progress to hand on prosthetic side only, then no hands.  Moving Cones / Cups: With equal weight on each leg: Hold on with one hand the first time, then progress to no hand supports. Move cups from one side of sink to the other. Place cups ~2" out of your reach, progress to 10" beyond reach.  Place one hand in middle of sink and reach with other hand. Do both arms.  Then hover one hand and move cups with other hand.  Overhead/Upward Reaching: alternated reaching up to top cabinets or ceiling if no cabinets present. Keep equal weight on each leg. Start with one hand support on counter while other hand reaches and progress to no hand support with reaching.  ace one hand in middle of sink and reach with other hand. Do both arms.  Then hover one hand and move cups with other hand.  5.   Looking Over Shoulders: With equal weight on each leg: alternate turning to look over your shoulders with one hand support on counter as needed.  Start with head motions only to look in front of shoulder, then even with shoulder and  progress to looking behind you. To look to side, move head /eyes, then shoulder on side looking pulls back, shift more weight to side looking and pull hip back. Place one hand in middle of sink and let go with other hand so your shoulder can pull back. Switch hands to look other way.   Then hover one hand and look over shoulder. If looking right, use left hand at sink. If looking left, use right hand at sink. 6.  Stepping with leg that is not amputated:  Move items under cabinet out of your way. Shift your  hips/pelvis so weight on prosthesis. Tighten muscles in hip on prosthetic side.  SLOWLY step other leg so front of foot is in cabinet. Then step back to floor.   ASSESSMENT:  CLINICAL IMPRESSION: Patient appears safe to use rollator walker.  He appears to understand how to negotiate stairs with 2 hands on same rail. Patient verbalizes understanding of slow progression of activities to help manage his pain.  Patient continues to benefit from skilled PT.    OBJECTIVE IMPAIRMENTS: Abnormal gait, cardiopulmonary status limiting activity, decreased activity tolerance, decreased balance, decreased endurance, decreased knowledge of condition, decreased knowledge of use of DME, decreased mobility, difficulty walking, decreased ROM, decreased strength, increased edema, prosthetic dependency , and pain.   ACTIVITY LIMITATIONS: carrying, lifting, bending, standing, stairs, transfers, and locomotion level  PARTICIPATION LIMITATIONS: meal prep, cleaning, driving, community activity, and yard work  PERSONAL FACTORS: Fitness, Time since onset of injury/illness/exacerbation, and 3+ comorbidities: see PMH  are also affecting patient's functional outcome.   REHAB POTENTIAL: Good  CLINICAL DECISION MAKING: Evolving/moderate complexity  EVALUATION COMPLEXITY: Moderate   GOALS: Goals reviewed with patient? Yes  SHORT TERM GOALS: Target date: 07/02/2023  Patient donnes prosthesis modified independent & verbalizes proper cleaning. Baseline: SEE OBJECTIVE DATA Goal status:   MET 06/30/2023 2.  Patient tolerates prosthesis >6 hrs total /day without skin issues or limb pain >5/10 after standing. Baseline: SEE OBJECTIVE DATA Goal status: Partially MET 06/30/2023  3.  Patient able to reach 5" and looks to sides without UE support with supervision. Baseline: SEE OBJECTIVE DATA Goal status: MET 07/07/2023  4. Patient ambulates 61' with RW & prosthesis with supervision. Baseline: SEE OBJECTIVE DATA Goal status:  MET 07/07/2023  5. Patient negotiates ramps & curbs with RW & prosthesis with minA. Baseline: SEE OBJECTIVE DATA Goal status: MET 06/23/2023  LONG TERM GOALS: Target date: 08/28/2023  Patient demonstrates & verbalized understanding of prosthetic care to enable safe utilization of prosthesis. Baseline: SEE OBJECTIVE DATA Goal status:   Ongoing 06/30/2023  Patient tolerates prosthesis wear >90% of awake hours without skin or limb pain issues. Baseline: SEE OBJECTIVE DATA Goal status: Ongoing 06/30/2023  Berg Balance >36/56 to indicate lower fall risk Baseline: SEE OBJECTIVE DATA Goal status: Ongoing 06/30/2023  Patient ambulates >300' with prosthesis & LRAD independently Baseline: SEE OBJECTIVE DATA Goal status: Ongoing 06/30/2023  Patient negotiates ramps, curbs & stairs with single rail with prosthesis & LRAD independently. Baseline: SEE OBJECTIVE DATA Goal status: Ongoing 06/30/2023   PLAN:  PT FREQUENCY: 2x/week  PT DURATION: 12 weeks  PLANNED INTERVENTIONS: 97164- PT Re-evaluation, 97110-Therapeutic exercises, 97530- Therapeutic activity, 97112- Neuromuscular re-education, [phone]- Self Care, 01027- Gait training, [phone]- Prosthetic training, Patient/Family education, Balance training, Stair training, Scar mobilization, Vestibular training, DME instructions, and Physical Performance Testing  PLAN FOR NEXT SESSION:  Instruct in adjusting ply socks,    balance activities.    Date of referral: 04/17/2023  PT eval  delayed by COPD exacerbation Referring provider: Aldean Baker, MD Referring diagnosis? Z89.511 (ICD-10-CM) - S/P BKA (below knee amputation), right Treatment diagnosis? (if different than referring diagnosis)   Other abnormalities of gait & mobility R26.89 Unsteadiness on feet R26.81 Pain in left leg M79.605 Pain in right leg M79.604 Muscle Weakness (generalized) M62.81 History of falling Z91.81 Impaired functional mobility, balance, gait and endurance  What was this  (referring dx) caused by? Surgery (Type: right BKA)  Nature of Condition: Initial Onset (within last 3 months)  prosthesis delivery 04/01/2023   Laterality: Rt  Current Functional Measure Score: Other Berg Balance 19/56  Objective measurements identify impairments when they are compared to normal values, the uninvolved extremity, and prior level of function.  [x]  Yes  []  No  Objective assessment of functional ability: Severe functional limitations   Briefly describe symptoms: dependent in care & use of prosthesis, impaired balance and gait, pain in both legs  How did symptoms start: amputation from wounds  Average pain intensity:  Last 24 hours: 8/10  Past week: 8/10  How often does the pt experience symptoms? Constantly  How much have the symptoms interfered with usual daily activities? Extremely  How has condition changed since care began at this facility? NA - initial visit  In general, how is the patients overall health? Good   BACK PAIN (STarT Back Screening Tool) No   Vladimir Faster, PT, DPT 07/09/2023, 12:46 PM

## 2023-07-14 ENCOUNTER — Ambulatory Visit (INDEPENDENT_AMBULATORY_CARE_PROVIDER_SITE_OTHER): Payer: Medicare Other | Admitting: Physical Therapy

## 2023-07-14 ENCOUNTER — Encounter: Payer: Self-pay | Admitting: Physical Therapy

## 2023-07-14 DIAGNOSIS — R2681 Unsteadiness on feet: Secondary | ICD-10-CM | POA: Diagnosis not present

## 2023-07-14 DIAGNOSIS — M6281 Muscle weakness (generalized): Secondary | ICD-10-CM

## 2023-07-14 DIAGNOSIS — R2689 Other abnormalities of gait and mobility: Secondary | ICD-10-CM

## 2023-07-14 DIAGNOSIS — M79605 Pain in left leg: Secondary | ICD-10-CM

## 2023-07-14 DIAGNOSIS — M79604 Pain in right leg: Secondary | ICD-10-CM | POA: Diagnosis not present

## 2023-07-14 NOTE — Therapy (Signed)
 OUTPATIENT PHYSICAL THERAPY PROSTHETIC TREATMENT   Patient Name: [name] MRN: [id number] DOB:July 16, 1950, 73 y.o., male Today's Date: 07/14/2023  END OF SESSION:  PT End of Session - 07/14/23 1148     Visit Number 7    Number of Visits 25    Date for PT Re-Evaluation 08/28/23    Authorization Type [employer] Medicare    Progress Note Due on Visit 10    PT Start Time 1145    PT Stop Time 1225    PT Time Calculation (min) 40 min    Equipment Utilized During Treatment Gait belt    Activity Tolerance Patient tolerated treatment well;Patient limited by pain;Patient limited by fatigue    Behavior During Therapy [facility] for tasks assessed/performed                  Past Medical History:  Diagnosis Date   Aortic stenosis, mild    Arthritis of hand    "just a little bit in both hands" (03/31/2012)   Asthma    "little bit" (03/31/2012)   Atrial fibrillation (HCC)    dx '04; DCCV '04, placed on flecainide, failed DCCV 04/2010, flecainide stopped; s/p successful A.fib ablation 01/31/12   CHF (congestive heart failure) (HCC)    Controlled type 2 diabetes mellitus without complication, without long-term current use of insulin (HCC) 12/13/2019   COPD (chronic obstructive pulmonary disease) (HCC)    DJD (degenerative joint disease)    Fatty liver    Fibromyalgia    GERD (gastroesophageal reflux disease)    Hyperlipidemia    Hypertension    Hypothyroidism    S/P radiation   Left atrial enlargement    LA size 45mm by echo 11/21/11   Mitral regurgitation    trivial   Obstructive sleep apnea    mild by sleep study 2013; pt stated he does not have a machine because "it wasn't bad enough for him to have one"   Pacemaker 03/31/2012   Pneumonia    last time 2023   Restless leg syndrome    Second degree AV block    Splenomegaly    Past Surgical History:  Procedure Laterality Date   AMPUTATION Right 08/23/2022   Procedure: RIGHT FOOT FIFTH RAY AMPUTATION;  Surgeon: [name],  MD;  Location: [facility] OR;  Service: Orthopedics;  Laterality: Right;   AMPUTATION Right 09/04/2022   Procedure: RIGHT BELOW KNEE AMPUTATION;  Surgeon: [name], MD;  Location: [facility] OR;  Service: Orthopedics;  Laterality: Right;   AORTIC VALVE REPLACEMENT N/A 08/22/2015   Procedure: AORTIC VALVE REPLACEMENT (AVR);  Surgeon: Kerin Perna, MD;  Location: [facility] OR;  Service: Open Heart Surgery;  Laterality: N/A;   ATRIAL FIBRILLATION ABLATION  01/30/2012   PVI by Dr. Johney Frame   ATRIAL FIBRILLATION ABLATION N/A 01/31/2012   Procedure: ATRIAL FIBRILLATION ABLATION;  Surgeon: [name], MD;  Location: [facility] CATH LAB;  Service: Cardiovascular;  Laterality: N/A;   ATRIAL FIBRILLATION ABLATION N/A 04/19/2020   Procedure: ATRIAL FIBRILLATION ABLATION;  Surgeon: [name], MD;  Location: [facility] INVASIVE CV LAB;  Service: Cardiovascular;  Laterality: N/A;   BACK SURGERY     X 3   BIOPSY  01/01/2022   Procedure: BIOPSY;  Surgeon: Kathi Der, MD;  Location: [facility] ENDOSCOPY;  Service: Gastroenterology;;   CARDIAC CATHETERIZATION N/A 08/09/2015   Procedure: Right/Left Heart Cath and Coronary Angiography;  Surgeon: Peter M Swaziland, MD;  Location: [facility] INVASIVE CV LAB;  Service: Cardiovascular;  Laterality: N/A;  CARDIAC CATHETERIZATION N/A 02/05/2016   Procedure: Right/Left Heart Cath and Coronary Angiography;  Surgeon: Corky Crafts, MD;  Location: [facility] INVASIVE CV LAB;  Service: Cardiovascular;  Laterality: N/A;   CARDIAC CATHETERIZATION N/A 04/17/2016   Procedure: Right Heart Cath;  Surgeon: Dolores Patty, MD;  Location: [facility] INVASIVE CV LAB;  Service: Cardiovascular;  Laterality: N/A;   CARDIOVASCULAR STRESS TEST  03/12/2002   EF 48%, NO EVIDENCE OF ISCHEMIA   CARDIOVERSION  01/2012; 03/31/2012   CARDIOVERSION N/A 02/25/2012   Procedure: CARDIOVERSION;  Surgeon: [name], MD;  Location: [facility] CATH LAB;  Service: Cardiovascular;  Laterality: N/A;   CARDIOVERSION N/A 03/31/2012   Procedure:  CARDIOVERSION;  Surgeon: [name], MD;  Location: [facility] CATH LAB;  Service: Cardiovascular;  Laterality: N/A;   CARDIOVERSION N/A 11/23/2015   Procedure: CARDIOVERSION;  Surgeon: Laurey Morale, MD;  Location: [facility] ENDOSCOPY;  Service: Cardiovascular;  Laterality: N/A;   CARDIOVERSION N/A 04/08/2016   Procedure: CARDIOVERSION;  Surgeon: Dolores Patty, MD;  Location: [facility] ENDOSCOPY;  Service: Cardiovascular;  Laterality: N/A;   CARDIOVERSION N/A 09/14/2018   Procedure: CARDIOVERSION;  Surgeon: Wendall Stade, MD;  Location: [facility] ENDOSCOPY;  Service: Cardiovascular;  Laterality: N/A;   CARDIOVERSION N/A 10/19/2019   Procedure: CARDIOVERSION;  Surgeon: Dolores Patty, MD;  Location: [facility] ENDOSCOPY;  Service: Cardiovascular;  Laterality: N/A;   CARDIOVERSION N/A 02/25/2020   Procedure: CARDIOVERSION;  Surgeon: Pricilla Riffle, MD;  Location: [facility] ENDOSCOPY;  Service: Cardiovascular;  Laterality: N/A;   CARDIOVERSION N/A 05/25/2020   Procedure: CARDIOVERSION;  Surgeon: Lewayne Bunting, MD;  Location: [facility] ENDOSCOPY;  Service: Cardiovascular;  Laterality: N/A;   CARDIOVERSION N/A 07/10/2020   Procedure: CARDIOVERSION;  Surgeon: Jake Bathe, MD;  Location: [facility] ENDOSCOPY;  Service: Cardiovascular;  Laterality: N/A;   CLIPPING OF ATRIAL APPENDAGE N/A 08/22/2015   Procedure: CLIPPING OF ATRIAL APPENDAGE;  Surgeon: Kerin Perna, MD;  Location: [facility] OR;  Service: Open Heart Surgery;  Laterality: N/A;   COLONOSCOPY WITH PROPOFOL N/A 01/01/2022   Procedure: COLONOSCOPY WITH PROPOFOL;  Surgeon: Kathi Der, MD;  Location: [facility] ENDOSCOPY;  Service: Gastroenterology;  Laterality: N/A;   DOPPLER ECHOCARDIOGRAPHY  03/11/2002   EF 70-75%   FINGER SURGERY Left    Middle finger   FINGER TENDON REPAIR  1980's   "right little finger" (03/31/2012)   FLEXIBLE SIGMOIDOSCOPY N/A 12/30/2021   Procedure: FLEXIBLE SIGMOIDOSCOPY;  Surgeon: Charlott Rakes, MD;  Location: [facility] ENDOSCOPY;  Service: Gastroenterology;   Laterality: N/A;   INSERT / REPLACE / REMOVE PACEMAKER     St Jude   IR RADIOLOGIST EVAL & MGMT  05/06/2019   IR RADIOLOGIST EVAL & MGMT  10/06/2019   IR RADIOLOGIST EVAL & MGMT  10/31/2020   IR RADIOLOGIST EVAL & MGMT  01/18/2021   IR RADIOLOGIST EVAL & MGMT  03/08/2021   IR RADIOLOGIST EVAL & MGMT  06/13/2021   KIDNEY SURGERY Left    LEFT AND RIGHT HEART CATHETERIZATION WITH CORONARY ANGIOGRAM N/A 10/27/2012   Procedure: LEFT AND RIGHT HEART CATHETERIZATION WITH CORONARY ANGIOGRAM;  Surgeon: Pamella Pert, MD;  Location: [facility] CATH LAB;  Service: Cardiovascular;  Laterality: N/A;   LUMBAR DISC SURGERY  1980's X2;  2000's   MAZE N/A 08/22/2015   Procedure: MAZE;  Surgeon: Kerin Perna, MD;  Location: [facility] OR;  Service: Open Heart Surgery;  Laterality: N/A;   PACEMAKER INSERTION  03/31/2012   STJ Accent DR pacemaker implanted by Dr Johney Frame   PERMANENT PACEMAKER  INSERTION N/A 03/31/2012   Procedure: PERMANENT PACEMAKER INSERTION;  Surgeon: [name], MD;  Location: [facility] CATH LAB;  Service: Cardiovascular;  Laterality: N/A;   PPM GENERATOR CHANGEOUT N/A 08/08/2020   Procedure: PPM GENERATOR CHANGEOUT;  Surgeon: [name], MD;  Location: [facility] INVASIVE CV LAB;  Service: Cardiovascular;  Laterality: N/A;   RADIOLOGY WITH ANESTHESIA Left 02/14/2021   Procedure: RADIOLOGY WITH ANESTHESIA LEFT RENAL CRYOABLATION;  Surgeon: Irish Lack, MD;  Location: [facility] ORS;  Service: Radiology;  Laterality: Left;   STUMP REVISION Right 10/09/2022   Procedure: REVISION RIGHT BELOW KNEE AMPUTATION;  Surgeon: [name], MD;  Location: [facility] OR;  Service: Orthopedics;  Laterality: Right;   STUMP REVISION Right 11/16/2022   Procedure: REVISION RIGHT BELOW KNEE AMPUTATION;  Surgeon: [name], MD;  Location: [facility] OR;  Service: Orthopedics;  Laterality: Right;   TEE WITHOUT CARDIOVERSION  01/30/2012   Procedure: TRANSESOPHAGEAL ECHOCARDIOGRAM (TEE);  Surgeon: Laurey Morale, MD;  Location: [facility] ENDOSCOPY;   Service: Cardiovascular;  Laterality: N/A;  ablation next day   TEE WITHOUT CARDIOVERSION N/A 08/22/2015   Procedure: TRANSESOPHAGEAL ECHOCARDIOGRAM (TEE);  Surgeon: Kerin Perna, MD;  Location: [facility] OR;  Service: Open Heart Surgery;  Laterality: N/A;   TEE WITHOUT CARDIOVERSION N/A 10/19/2019   Procedure: TRANSESOPHAGEAL ECHOCARDIOGRAM (TEE);  Surgeon: Dolores Patty, MD;  Location: [facility] ENDOSCOPY;  Service: Cardiovascular;  Laterality: N/A;   US ECHOCARDIOGRAPHY  08/07/2009   EF 55-60%   Patient Active Problem List   Diagnosis Date Noted   AKI (acute kidney injury) (HCC) 12/22/2022   Fall 11/14/2022   Pain of right lower extremity due to injury 11/14/2022   Diabetic ulcer of lower leg (HCC) 11/13/2022   Osteomyelitis (HCC) 11/13/2022   Dyslipidemia 10/18/2022   Cellulitis of right lower extremity 10/17/2022   Dehiscence of amputation stump of right lower extremity (HCC) 10/09/2022   Below-knee amputation of right lower extremity (HCC) 10/09/2022   Gangrene of right foot (HCC) 09/04/2022   History of below-knee amputation of right lower extremity (HCC) 09/04/2022   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) 08/23/2022   CAP (community acquired pneumonia) 12/28/2021   Constipation 12/28/2021   Pain in both lower extremities 07/03/2021   Peripheral neuropathy 07/03/2021   Acute metabolic encephalopathy 06/09/2021   Marijuana dependence (HCC) 06/09/2021   DNR (do not resuscitate) 06/09/2021   Chest pain 06/08/2021   Renal lesion 02/14/2021   Status post cryoablation 02/14/2021   Allergic rhinitis 12/08/2020   Preop pulmonary/respiratory exam 03/27/2020   Acute on chronic diastolic (congestive) heart failure (HCC) 01/22/2020   Abdominal pain 01/22/2020   Hypothyroidism 12/13/2019   DM2 (diabetes mellitus, type 2) (HCC) 12/13/2019   Chronic kidney disease, stage 3b (HCC) 12/13/2019   Acute bacterial bronchitis 12/13/2019   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   Typical atrial  flutter (HCC)    Chronic respiratory failure with hypoxia (HCC) 05/23/2016   CAD (coronary artery disease) 05/23/2016   Persistent atrial fibrillation (HCC)    Atypical atrial flutter (HCC)    Visit for monitoring Tikosyn therapy 04/16/2016   Chronic diastolic CHF (congestive heart failure) (HCC) 02/03/2016   Cough 12/06/2015   Acute on chronic respiratory failure with hypoxia (HCC) 09/08/2015   Hallucinations 09/04/2015   Lymphadenopathy 09/04/2015   Ascending aortic aneurysm (HCC) 09/04/2015   Hypoxia 09/02/2015   S/P aortic valve replacement with bioprosthetic valve 09/02/2015   Pleural effusion 09/02/2015   S/P AVR 08/22/2015   GERD without esophagitis 09/20/2014  Intrinsic asthma 10/13/2013   Dyspnea 09/15/2013   Pulmonary hypertension (HCC) 09/15/2013   Shortness of breath 05/05/2013   Pacemaker-St.Jude 04/01/2012   Bradycardia 03/31/2012   Second degree AV block 03/31/2012   AV block, 1st degree 02/01/2012   PAF (paroxysmal atrial fibrillation) (HCC) 11/13/2011   Fatigue 11/13/2011   Essential hypertension 11/13/2011   Aortic stenosis 11/13/2011   Sleep apnea 11/13/2011    PCP: Alysia Penna, MD  REFERRING PROVIDER: Aldean Baker, MD  ONSET DATE: 04/01/2023 prosthesis delivery  REFERRING DIAG: Z89.511 (ICD-10-CM) - S/P BKA (below knee amputation), right   THERAPY DIAG:  Other abnormalities of gait and mobility  Unsteadiness on feet  Pain in right leg  Pain in left leg  Muscle weakness (generalized)  Rationale for Evaluation and Treatment: Rehabilitation  SUBJECTIVE:   SUBJECTIVE STATEMENT: He got rollator walker yesterday which he likes.    PERTINENT HISTORY:  right TTA 09/04/22, DM2, HLD, asthma, COPD, A-Fib, CAD, pacemaker, cardioversion & catheterization, arthritis, Fibromyalgia, GERD, HTN,   PAIN:  Are you having pain? Yes: NPRS scale: 9/10 Right leg, 10/10 left leg Pain location: both legs Pain description: neuropathy, sharp, sticking "big"  needles in leg Aggravating factors: meds wearing off,  standing & walking Relieving factors: pain pill 2x/day  Residual limb prosthesis on limb 0/10 - 10/10,  stand or walk 5/10 - 10/10  PRECAUTIONS: Fall and ICD/Pacemaker  WEIGHT BEARING RESTRICTIONS: No  FALLS: Has patient fallen in last 6 months? Yes. Number of falls 1 no injuries  LIVING ENVIRONMENT: Lives with: lives with their spouse and 3 dogs (mini schnauzer 15# indoor & 2 german Keats outdoor)  Lives in: [place] Home Access: Ramped entrance Home layout: One level Stairs: Yes: External: 6 steps; can reach both ramp at primary entrance Has following equipment at home: Single point cane, Environmental consultant - 2 wheeled, Wheelchair (manual), shower chair, Tour manager, and Ramped entry  OCCUPATION: retired from [employer]  PLOF: Independent, Independent with household mobility without device, and Independent with community mobility without device used oxygen as needed  PATIENT GOALS:  use prosthesis to function in community & in house, do odd repairs, yard work  OBJECTIVE:  COGNITION: Overall cognitive status: Within functional limits for tasks assessed   SENSATION: WFL  CARDIOVASCULAR RESPONSE: Functional activity: balance and gait assessments Pre-activity vitals: no oxygen seated in w/c HR: 67 SpO2: 93 Post-activity vitals: Berg with no oxygen  HR: 78 SpO2: 90-91%  94% on oxygen at rest, HR 70 After gait with O2 SpO2 94%, HR 73  Modified Borg scale for dyspnea: 2: mild shortness of breath  POSTURE: rounded shoulders, forward head, flexed trunk , and weight shift left  LOWER EXTREMITY ROM:  ROM P:passive  A:active Right eval Left eval  Hip flexion    Hip extension standing -10*    Hip abduction    Hip adduction    Hip internal rotation    Hip external rotation    Knee flexion    Knee extension    Ankle dorsiflexion    Ankle plantarflexion    Ankle inversion    Ankle eversion     (Blank rows = not  tested)  LOWER EXTREMITY MMT:  MMT Right eval Left eval  Hip flexion    Hip extension 3-4/5 3-4/5  Hip abduction 3-4/5 4/5  Hip adduction    Hip internal rotation    Hip external rotation    Knee flexion 3-4/5 4/5  Knee extension 3-4/5 4/5  Ankle dorsiflexion  Ankle plantarflexion    Ankle inversion    Ankle eversion    At Evaluation all strength testing is grossly seated and functionally standing / gait. (Blank rows = not tested)  TRANSFERS: Sit to stand: SBA from 20" w/c using BUEs on armrests to arise stabilizes with back of legs against w/c Stand to sit: SBA uses BUEs on armrests of w/c to control descent  FUNCTIONAL TESTs:  Berg Balance Scale: 19/56    GAIT: Gait pattern: step to pattern, decreased step length- Left, decreased stance time- Right, decreased hip/knee flexion- Right, decreased ankle dorsiflexion- Right, Right hip hike, knee flexed in stance- Right, lateral hip instability, trunk flexed, and abducted- Right Distance walked: 25' Assistive device utilized: Environmental consultant - 2 wheeled and TTA prosthesis Level of assistance: Min A balance loss to left on straight path & 180* turn Comments: used O2 with portable unit slung over left shoulder,  pain in residual limb limited distance,   CURRENT PROSTHETIC WEAR ASSESSMENT: 06/03/2023:  Patient is dependent with: skin check, residual limb care, care of non-amputated limb, prosthetic cleaning, ply sock cleaning, correct ply sock adjustment, proper wear schedule/adjustment, and proper weight-bearing schedule/adjustment Donning prosthesis: dependent with PT 100% cues on technique Doffing prosthesis: reports modified independent Prosthetic wear tolerance: 2-4 hours, 1x/day, ~3 days/week Prosthetic weight bearing tolerance: 5 minutes with partial weight on prosthesis with pain increasing to 8/10 (intolerable) Edema: pitting with 7 sec capillary refill Residual limb condition: dry skin especially distal limb, 3 scratches with  superficial scab on distal limb, scar healed but adhered over entire area of distal tibia, bulbous shape,  Prosthetic description: silicon liner with pin lock suspension, total contact socket with flexible inner socket, dynamic response foot K code/activity level with prosthetic use: Level 3    TODAY'S TREATMENT:                                                                                                                             DATE:  07/14/2023: Prosthetic Care with TTA prosthesis: Pt amb with his rollator walker >200' and negotiated ramp & curb safely. Sit to/from stand from rollator seat safely.   PT instructed pt & wife in adjusting ply socks with verbal & tactile (too few, too many and correct ply) cues.  Pt and wife verbalized understanding.  PT recommended increasing wear to all awake hours except 2 hours midday.  Pt verbalized understanding.  PT demo & verbal cues loading & unloading rollator in/out of car.  Pt & wife verbalized understanding.   Pt able to ambulate around car using BUEs on car safely.     TREATMENT:  DATE:  07/09/2023: Prosthetic Care with TTA prosthesis: No issues with residual limb.  PT recommended wearing prosthesis from arising to bedtime except 2 hours midday.  PT educated on using prosthesis to enter / exit for shower.  Pt & wife verbalized understanding.  PT educated pt & wife verbal & demo in options for driving with Rt TTA.  Heavy recommendation to practice in middle of large empty parking lot. Pt & wife verbalized understanding.  Pt amb with rollator walker 100' and neg ramp/curb with sit/stand from rollator seat. Verbal cues only needed on position within rollator and to lock brakes for ramps/curbs.   PT demo & verbal cues on neg stairs.  Side stepping with BUEs on single rail: 4 steps with each rail with supervision.  Pt & wife  verbalized understanding.    TREATMENT:                                                                                                                             DATE:  07/07/2023: Prosthetic Care with TTA prosthesis: -Pt's residual limb has smaller dry scabs on incision.  He is using mineral oil over patella to reduce friction from liner. PT recommended increasing 5 hours 2x/day with off 2 hours with shrinker between. Pt & wife verbalized understanding.  -pt amb 150' with RW with SBA. Verbal cues on upright posture, position within RW and step through pattern.  -PT demo & verbal cues on rollator walker use, safety, benefits and design options.  Pt amb 75' X 2 with rollator walker with supervision. He had improved fluency of motion with control.  Pt able to sit to/from stand from rollator seat with PT lightly stabilizing rollator walker.  -PT demo & verbal cues on rollator walker use with negotiating ramps & curbs.  Pt neg 6.5" curb and 12* incline with rollator walker with CGA.      PATIENT EDUCATION: PATIENT EDUCATED ON FOLLOWING PROSTHETIC CARE: Education details:  Skin check, Residual limb care, Prosthetic cleaning, Correct ply sock adjustment, Propper donning, and Proper wear schedule/adjustment Prosthetic wear tolerance: 2 hours 2x/day, 7 days/week Person educated: Patient and Spouse Education method: Explanation, Demonstration, Tactile cues, and Verbal cues Education comprehension: verbalized understanding, verbal cues required, tactile cues required, and needs further education  HOME EXERCISE PROGRAM: Printed and issued 06/25/23 Do each exercise 1-2  times per day Do each exercise 5-10 repetitions Hold each exercise for 2 seconds to feel your location  AT SINK FIND YOUR MIDLINE POSITION AND PLACE FEET EQUAL DISTANCE FROM THE MIDLINE.  Try to find this position when standing still for activities.   USE TAPE ON FLOOR TO MARK THE MIDLINE POSITION which is even with middle of sink.   You also should try to feel with your limb pressure in socket.  You are trying to feel with limb what you used to feel with the bottom of your foot.  Side to Side Shift: Moving your hips only (not shoulders):  move weight onto your left leg, HOLD/FEEL pressure in socket.  Move back to equal weight on each leg, HOLD/FEEL pressure in socket. Move weight onto your right leg, HOLD/FEEL pressure in socket. Move back to equal weight on each leg, HOLD/FEEL pressure in socket. Repeat.  Start with both hands on sink, progress to hand on prosthetic side only, then no hands.  Front to Back Shift: Moving your hips only (not shoulders): move your weight forward onto your toes, HOLD/FEEL pressure in socket. Move your weight back to equal Flat Foot on both legs, HOLD/FEEL  pressure in socket. Move your weight back onto your heels, HOLD/FEEL  pressure in socket. Move your weight back to equal on both legs, HOLD/FEEL  pressure in socket. Repeat.  Start with both hands on sink, progress to hand on prosthetic side only, then no hands.  Moving Cones / Cups: With equal weight on each leg: Hold on with one hand the first time, then progress to no hand supports. Move cups from one side of sink to the other. Place cups ~2" out of your reach, progress to 10" beyond reach.  Place one hand in middle of sink and reach with other hand. Do both arms.  Then hover one hand and move cups with other hand.  Overhead/Upward Reaching: alternated reaching up to top cabinets or ceiling if no cabinets present. Keep equal weight on each leg. Start with one hand support on counter while other hand reaches and progress to no hand support with reaching.  ace one hand in middle of sink and reach with other hand. Do both arms.  Then hover one hand and move cups with other hand.  5.   Looking Over Shoulders: With equal weight on each leg: alternate turning to look over your shoulders with one hand support on counter as needed.  Start with head motions  only to look in front of shoulder, then even with shoulder and progress to looking behind you. To look to side, move head /eyes, then shoulder on side looking pulls back, shift more weight to side looking and pull hip back. Place one hand in middle of sink and let go with other hand so your shoulder can pull back. Switch hands to look other way.   Then hover one hand and look over shoulder. If looking right, use left hand at sink. If looking left, use right hand at sink. 6.  Stepping with leg that is not amputated:  Move items under cabinet out of your way. Shift your hips/pelvis so weight on prosthesis. Tighten muscles in hip on prosthetic side.  SLOWLY step other leg so front of foot is in cabinet. Then step back to floor.   ASSESSMENT:  CLINICAL IMPRESSION: Patient appears safe using his rollator walker and reports feels better.  Pt appears to understand adjusting ply socks better.   Patient continues to benefit from skilled PT.    OBJECTIVE IMPAIRMENTS: Abnormal gait, cardiopulmonary status limiting activity, decreased activity tolerance, decreased balance, decreased endurance, decreased knowledge of condition, decreased knowledge of use of DME, decreased mobility, difficulty walking, decreased ROM, decreased strength, increased edema, prosthetic dependency , and pain.   ACTIVITY LIMITATIONS: carrying, lifting, bending, standing, stairs, transfers, and locomotion level  PARTICIPATION LIMITATIONS: meal prep, cleaning, driving, community activity, and yard work  PERSONAL FACTORS: Fitness, Time since onset of injury/illness/exacerbation, and 3+ comorbidities: see PMH  are also affecting patient's functional outcome.   REHAB POTENTIAL: Good  CLINICAL DECISION MAKING: Evolving/moderate complexity  EVALUATION COMPLEXITY: Moderate  GOALS: Goals reviewed with patient? Yes  SHORT TERM GOALS: Target date: 07/02/2023  Patient donnes prosthesis modified independent & verbalizes proper  cleaning. Baseline: SEE OBJECTIVE DATA Goal status:   MET 06/30/2023 2.  Patient tolerates prosthesis >6 hrs total /day without skin issues or limb pain >5/10 after standing. Baseline: SEE OBJECTIVE DATA Goal status: Partially MET 06/30/2023  3.  Patient able to reach 5" and looks to sides without UE support with supervision. Baseline: SEE OBJECTIVE DATA Goal status: MET 07/07/2023  4. Patient ambulates 62' with RW & prosthesis with supervision. Baseline: SEE OBJECTIVE DATA Goal status: MET 07/07/2023  5. Patient negotiates ramps & curbs with RW & prosthesis with minA. Baseline: SEE OBJECTIVE DATA Goal status: MET 06/23/2023  LONG TERM GOALS: Target date: 08/28/2023  Patient demonstrates & verbalized understanding of prosthetic care to enable safe utilization of prosthesis. Baseline: SEE OBJECTIVE DATA Goal status:   Ongoing 06/30/2023  Patient tolerates prosthesis wear >90% of awake hours without skin or limb pain issues. Baseline: SEE OBJECTIVE DATA Goal status: Ongoing 06/30/2023  Berg Balance >36/56 to indicate lower fall risk Baseline: SEE OBJECTIVE DATA Goal status: Ongoing 06/30/2023  Patient ambulates >300' with prosthesis & LRAD independently Baseline: SEE OBJECTIVE DATA Goal status: Ongoing 06/30/2023  Patient negotiates ramps, curbs & stairs with single rail with prosthesis & LRAD independently. Baseline: SEE OBJECTIVE DATA Goal status: Ongoing 06/30/2023   PLAN:  PT FREQUENCY: 2x/week  PT DURATION: 12 weeks  PLANNED INTERVENTIONS: 97164- PT Re-evaluation, 97110-Therapeutic exercises, 97530- Therapeutic activity, 97112- Neuromuscular re-education, [phone]- Self Care, 62952- Gait training, [phone]- Prosthetic training, Patient/Family education, Balance training, Stair training, Scar mobilization, Vestibular training, DME instructions, and Physical Performance Testing  PLAN FOR NEXT SESSION:  Check if he walked on grass or gravel at home,    balance activities.    Date of  referral: 04/17/2023  PT eval delayed by COPD exacerbation Referring provider: Aldean Baker, MD Referring diagnosis? Z89.511 (ICD-10-CM) - S/P BKA (below knee amputation), right Treatment diagnosis? (if different than referring diagnosis)   Other abnormalities of gait & mobility R26.89 Unsteadiness on feet R26.81 Pain in left leg M79.605 Pain in right leg M79.604 Muscle Weakness (generalized) M62.81 History of falling Z91.81 Impaired functional mobility, balance, gait and endurance  What was this (referring dx) caused by? Surgery (Type: right BKA)  Nature of Condition: Initial Onset (within last 3 months)  prosthesis delivery 04/01/2023   Laterality: Rt  Current Functional Measure Score: Other Berg Balance 19/56  Objective measurements identify impairments when they are compared to normal values, the uninvolved extremity, and prior level of function.  [x]  Yes  []  No  Objective assessment of functional ability: Severe functional limitations   Briefly describe symptoms: dependent in care & use of prosthesis, impaired balance and gait, pain in both legs  How did symptoms start: amputation from wounds  Average pain intensity:  Last 24 hours: 8/10  Past week: 8/10  How often does the pt experience symptoms? Constantly  How much have the symptoms interfered with usual daily activities? Extremely  How has condition changed since care began at this facility? NA - initial visit  In general, how is the patients overall health? Good   BACK PAIN (STarT Back Screening Tool) No   Vladimir Faster, PT, DPT 07/14/2023, 12:35 PM

## 2023-07-16 ENCOUNTER — Ambulatory Visit: Payer: Medicare Other | Admitting: Physical Therapy

## 2023-07-16 ENCOUNTER — Encounter: Payer: Self-pay | Admitting: Physical Therapy

## 2023-07-16 DIAGNOSIS — Z7409 Other reduced mobility: Secondary | ICD-10-CM

## 2023-07-16 DIAGNOSIS — Z9181 History of falling: Secondary | ICD-10-CM

## 2023-07-16 DIAGNOSIS — R2689 Other abnormalities of gait and mobility: Secondary | ICD-10-CM | POA: Diagnosis not present

## 2023-07-16 DIAGNOSIS — M79605 Pain in left leg: Secondary | ICD-10-CM

## 2023-07-16 DIAGNOSIS — R2681 Unsteadiness on feet: Secondary | ICD-10-CM | POA: Diagnosis not present

## 2023-07-16 DIAGNOSIS — M79604 Pain in right leg: Secondary | ICD-10-CM | POA: Diagnosis not present

## 2023-07-16 DIAGNOSIS — M6281 Muscle weakness (generalized): Secondary | ICD-10-CM

## 2023-07-16 NOTE — Patient Instructions (Signed)
Driving options with Below Knee Prosthesis   Option 1:  Remove prosthesis and use left leg to crossover drive Option 2:  2 Foot driving - right foot on gas & left foot on brake.  Prosthetic motion is leg press motion not ankle pump.  Focus is on heel pressing out not on toes/forefoot.   Option 3: Use prosthesis on both gas & brake.  Continue to focus on heel motion & position.  Option 4: Left foot accelerator.  Consider portable left foot accelerator ( http://plfa.org ) as allows to switch between cars or remove for other drivers.  Left foot both gas & brake.  Using cruise control to accelerate & decelerate or resume speed after stopping can safe some leg work / motion.   Practice initially in large empty parking lot. Stay in middle not near curbs. Practice turning & backing into parking spot on right & left. Parallel park.  Back up.  Quick stops. More about learning foot work not staying in motion.   

## 2023-07-16 NOTE — Therapy (Addendum)
 OUTPATIENT PHYSICAL THERAPY PROSTHETIC TREATMENT   Patient Name: [name] MRN: [id number] DOB:[birth year], 73 y.o., male Today's Date: 07/16/2023  END OF SESSION:  PT End of Session - 07/16/23 1146     Visit Number 8    Number of Visits 25    Date for PT Re-Evaluation 08/28/23    Authorization Type [employer]    Progress Note Due on Visit 10    PT Start Time 1144    PT Stop Time 1228    PT Time Calculation (min) 44 min    Equipment Utilized During Treatment Gait belt    Activity Tolerance Patient tolerated treatment well;Patient limited by pain;Patient limited by fatigue    Behavior During Therapy [facility] for tasks assessed/performed                  Past Medical History:  Diagnosis Date   Aortic stenosis, mild    Arthritis of hand    "just a little bit in both hands" (03/31/2012)   Asthma    "little bit" (03/31/2012)   Atrial fibrillation (HCC)    dx '04; DCCV '04, placed on flecainide, failed DCCV 04/2010, flecainide stopped; s/p successful A.fib ablation 01/31/12   CHF (congestive heart failure) (HCC)    Controlled type 2 diabetes mellitus without complication, without long-term current use of insulin (HCC) 12/13/2019   COPD (chronic obstructive pulmonary disease) (HCC)    DJD (degenerative joint disease)    Fatty liver    Fibromyalgia    GERD (gastroesophageal reflux disease)    Hyperlipidemia    Hypertension    Hypothyroidism    S/P radiation   Left atrial enlargement    LA size 45mm by echo 11/21/11   Mitral regurgitation    trivial   Obstructive sleep apnea    mild by sleep study 2013; pt stated he does not have a machine because "it wasn't bad enough for him to have one"   Pacemaker 03/31/2012   Pneumonia    last time 2023   Restless leg syndrome    Second degree AV block    Splenomegaly    Past Surgical History:  Procedure Laterality Date   AMPUTATION Right 08/23/2022   Procedure: RIGHT FOOT FIFTH RAY AMPUTATION;  Surgeon: [name],  MD;  Location: [facility] OR;  Service: Orthopedics;  Laterality: Right;   AMPUTATION Right 09/04/2022   Procedure: RIGHT BELOW KNEE AMPUTATION;  Surgeon: [name], MD;  Location: [facility] OR;  Service: Orthopedics;  Laterality: Right;   AORTIC VALVE REPLACEMENT N/A 08/22/2015   Procedure: AORTIC VALVE REPLACEMENT (AVR);  Surgeon: Kerin Perna, MD;  Location: [facility] OR;  Service: Open Heart Surgery;  Laterality: N/A;   ATRIAL FIBRILLATION ABLATION  01/30/2012   PVI by Dr. Johney Frame   ATRIAL FIBRILLATION ABLATION N/A 01/31/2012   Procedure: ATRIAL FIBRILLATION ABLATION;  Surgeon: [name], MD;  Location: [facility] CATH LAB;  Service: Cardiovascular;  Laterality: N/A;   ATRIAL FIBRILLATION ABLATION N/A 04/19/2020   Procedure: ATRIAL FIBRILLATION ABLATION;  Surgeon: [name], MD;  Location: [facility] INVASIVE CV LAB;  Service: Cardiovascular;  Laterality: N/A;   BACK SURGERY     X 3   BIOPSY  01/01/2022   Procedure: BIOPSY;  Surgeon: Kathi Der, MD;  Location: [facility] ENDOSCOPY;  Service: Gastroenterology;;   CARDIAC CATHETERIZATION N/A 08/09/2015   Procedure: Right/Left Heart Cath and Coronary Angiography;  Surgeon: Peter M Swaziland, MD;  Location: [facility] INVASIVE CV LAB;  Service: Cardiovascular;  Laterality: N/A;  CARDIAC CATHETERIZATION N/A 02/05/2016   Procedure: Right/Left Heart Cath and Coronary Angiography;  Surgeon: Corky Crafts, MD;  Location: [facility] INVASIVE CV LAB;  Service: Cardiovascular;  Laterality: N/A;   CARDIAC CATHETERIZATION N/A 04/17/2016   Procedure: Right Heart Cath;  Surgeon: Dolores Patty, MD;  Location: [facility] INVASIVE CV LAB;  Service: Cardiovascular;  Laterality: N/A;   CARDIOVASCULAR STRESS TEST  03/12/2002   EF 48%, NO EVIDENCE OF ISCHEMIA   CARDIOVERSION  01/2012; 03/31/2012   CARDIOVERSION N/A 02/25/2012   Procedure: CARDIOVERSION;  Surgeon: [name], MD;  Location: [facility] CATH LAB;  Service: Cardiovascular;  Laterality: N/A;   CARDIOVERSION N/A 03/31/2012   Procedure:  CARDIOVERSION;  Surgeon: [name], MD;  Location: [facility] CATH LAB;  Service: Cardiovascular;  Laterality: N/A;   CARDIOVERSION N/A 11/23/2015   Procedure: CARDIOVERSION;  Surgeon: Laurey Morale, MD;  Location: [facility] ENDOSCOPY;  Service: Cardiovascular;  Laterality: N/A;   CARDIOVERSION N/A 04/08/2016   Procedure: CARDIOVERSION;  Surgeon: Dolores Patty, MD;  Location: [facility] ENDOSCOPY;  Service: Cardiovascular;  Laterality: N/A;   CARDIOVERSION N/A 09/14/2018   Procedure: CARDIOVERSION;  Surgeon: Wendall Stade, MD;  Location: [facility] ENDOSCOPY;  Service: Cardiovascular;  Laterality: N/A;   CARDIOVERSION N/A 10/19/2019   Procedure: CARDIOVERSION;  Surgeon: Dolores Patty, MD;  Location: [facility] ENDOSCOPY;  Service: Cardiovascular;  Laterality: N/A;   CARDIOVERSION N/A 02/25/2020   Procedure: CARDIOVERSION;  Surgeon: Pricilla Riffle, MD;  Location: [facility] ENDOSCOPY;  Service: Cardiovascular;  Laterality: N/A;   CARDIOVERSION N/A 05/25/2020   Procedure: CARDIOVERSION;  Surgeon: Lewayne Bunting, MD;  Location: [facility] ENDOSCOPY;  Service: Cardiovascular;  Laterality: N/A;   CARDIOVERSION N/A 07/10/2020   Procedure: CARDIOVERSION;  Surgeon: Jake Bathe, MD;  Location: [facility] ENDOSCOPY;  Service: Cardiovascular;  Laterality: N/A;   CLIPPING OF ATRIAL APPENDAGE N/A 08/22/2015   Procedure: CLIPPING OF ATRIAL APPENDAGE;  Surgeon: Kerin Perna, MD;  Location: [facility] OR;  Service: Open Heart Surgery;  Laterality: N/A;   COLONOSCOPY WITH PROPOFOL N/A 01/01/2022   Procedure: COLONOSCOPY WITH PROPOFOL;  Surgeon: Kathi Der, MD;  Location: [facility] ENDOSCOPY;  Service: Gastroenterology;  Laterality: N/A;   DOPPLER ECHOCARDIOGRAPHY  03/11/2002   EF 70-75%   FINGER SURGERY Left    Middle finger   FINGER TENDON REPAIR  1980's   "right little finger" (03/31/2012)   FLEXIBLE SIGMOIDOSCOPY N/A 12/30/2021   Procedure: FLEXIBLE SIGMOIDOSCOPY;  Surgeon: Charlott Rakes, MD;  Location: [facility] ENDOSCOPY;  Service: Gastroenterology;   Laterality: N/A;   INSERT / REPLACE / REMOVE PACEMAKER     St Jude   IR RADIOLOGIST EVAL & MGMT  05/06/2019   IR RADIOLOGIST EVAL & MGMT  10/06/2019   IR RADIOLOGIST EVAL & MGMT  10/31/2020   IR RADIOLOGIST EVAL & MGMT  01/18/2021   IR RADIOLOGIST EVAL & MGMT  03/08/2021   IR RADIOLOGIST EVAL & MGMT  06/13/2021   KIDNEY SURGERY Left    LEFT AND RIGHT HEART CATHETERIZATION WITH CORONARY ANGIOGRAM N/A 10/27/2012   Procedure: LEFT AND RIGHT HEART CATHETERIZATION WITH CORONARY ANGIOGRAM;  Surgeon: Pamella Pert, MD;  Location: [facility] CATH LAB;  Service: Cardiovascular;  Laterality: N/A;   LUMBAR DISC SURGERY  1980's X2;  2000's   MAZE N/A 08/22/2015   Procedure: MAZE;  Surgeon: Kerin Perna, MD;  Location: [facility] OR;  Service: Open Heart Surgery;  Laterality: N/A;   PACEMAKER INSERTION  03/31/2012   STJ Accent DR pacemaker implanted by Dr Johney Frame   PERMANENT PACEMAKER  INSERTION N/A 03/31/2012   Procedure: PERMANENT PACEMAKER INSERTION;  Surgeon: [name], MD;  Location: [facility] CATH LAB;  Service: Cardiovascular;  Laterality: N/A;   PPM GENERATOR CHANGEOUT N/A 08/08/2020   Procedure: PPM GENERATOR CHANGEOUT;  Surgeon: [name], MD;  Location: [facility] INVASIVE CV LAB;  Service: Cardiovascular;  Laterality: N/A;   RADIOLOGY WITH ANESTHESIA Left 02/14/2021   Procedure: RADIOLOGY WITH ANESTHESIA LEFT RENAL CRYOABLATION;  Surgeon: Irish Lack, MD;  Location: [facility] ORS;  Service: Radiology;  Laterality: Left;   STUMP REVISION Right 10/09/2022   Procedure: REVISION RIGHT BELOW KNEE AMPUTATION;  Surgeon: [name], MD;  Location: [facility] OR;  Service: Orthopedics;  Laterality: Right;   STUMP REVISION Right 11/16/2022   Procedure: REVISION RIGHT BELOW KNEE AMPUTATION;  Surgeon: [name], MD;  Location: [facility] OR;  Service: Orthopedics;  Laterality: Right;   TEE WITHOUT CARDIOVERSION  01/30/2012   Procedure: TRANSESOPHAGEAL ECHOCARDIOGRAM (TEE);  Surgeon: Laurey Morale, MD;  Location: [facility] ENDOSCOPY;   Service: Cardiovascular;  Laterality: N/A;  ablation next day   TEE WITHOUT CARDIOVERSION N/A 08/22/2015   Procedure: TRANSESOPHAGEAL ECHOCARDIOGRAM (TEE);  Surgeon: Kerin Perna, MD;  Location: [facility] OR;  Service: Open Heart Surgery;  Laterality: N/A;   TEE WITHOUT CARDIOVERSION N/A 10/19/2019   Procedure: TRANSESOPHAGEAL ECHOCARDIOGRAM (TEE);  Surgeon: Dolores Patty, MD;  Location: [facility] ENDOSCOPY;  Service: Cardiovascular;  Laterality: N/A;   US ECHOCARDIOGRAPHY  08/07/2009   EF 55-60%   Patient Active Problem List   Diagnosis Date Noted   AKI (acute kidney injury) (HCC) 12/22/2022   Fall 11/14/2022   Pain of right lower extremity due to injury 11/14/2022   Diabetic ulcer of lower leg (HCC) 11/13/2022   Osteomyelitis (HCC) 11/13/2022   Dyslipidemia 10/18/2022   Cellulitis of right lower extremity 10/17/2022   Dehiscence of amputation stump of right lower extremity (HCC) 10/09/2022   Below-knee amputation of right lower extremity (HCC) 10/09/2022   Gangrene of right foot (HCC) 09/04/2022   History of below-knee amputation of right lower extremity (HCC) 09/04/2022   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) 08/23/2022   CAP (community acquired pneumonia) 12/28/2021   Constipation 12/28/2021   Pain in both lower extremities 07/03/2021   Peripheral neuropathy 07/03/2021   Acute metabolic encephalopathy 06/09/2021   Marijuana dependence (HCC) 06/09/2021   DNR (do not resuscitate) 06/09/2021   Chest pain 06/08/2021   Renal lesion 02/14/2021   Status post cryoablation 02/14/2021   Allergic rhinitis 12/08/2020   Preop pulmonary/respiratory exam 03/27/2020   Acute on chronic diastolic (congestive) heart failure (HCC) 01/22/2020   Abdominal pain 01/22/2020   Hypothyroidism 12/13/2019   DM2 (diabetes mellitus, type 2) (HCC) 12/13/2019   Chronic kidney disease, stage 3b (HCC) 12/13/2019   Acute bacterial bronchitis 12/13/2019   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   Typical atrial  flutter (HCC)    Chronic respiratory failure with hypoxia (HCC) 05/23/2016   CAD (coronary artery disease) 05/23/2016   Persistent atrial fibrillation (HCC)    Atypical atrial flutter (HCC)    Visit for monitoring Tikosyn therapy 04/16/2016   Chronic diastolic CHF (congestive heart failure) (HCC) 02/03/2016   Cough 12/06/2015   Acute on chronic respiratory failure with hypoxia (HCC) 09/08/2015   Hallucinations 09/04/2015   Lymphadenopathy 09/04/2015   Ascending aortic aneurysm (HCC) 09/04/2015   Hypoxia 09/02/2015   S/P aortic valve replacement with bioprosthetic valve 09/02/2015   Pleural effusion 09/02/2015   S/P AVR 08/22/2015   GERD without esophagitis 09/20/2014  Intrinsic asthma 10/13/2013   Dyspnea 09/15/2013   Pulmonary hypertension (HCC) 09/15/2013   Shortness of breath 05/05/2013   Pacemaker-St.Jude 04/01/2012   Bradycardia 03/31/2012   Second degree AV block 03/31/2012   AV block, 1st degree 02/01/2012   PAF (paroxysmal atrial fibrillation) (HCC) 11/13/2011   Fatigue 11/13/2011   Essential hypertension 11/13/2011   Aortic stenosis 11/13/2011   Sleep apnea 11/13/2011    PCP: Alysia Penna, MD  REFERRING PROVIDER: Aldean Baker, MD  ONSET DATE: 04/01/2023 prosthesis delivery  REFERRING DIAG: Z89.511 (ICD-10-CM) - S/P BKA (below knee amputation), right   THERAPY DIAG:  Other abnormalities of gait and mobility  Unsteadiness on feet  Pain in right leg  Pain in left leg  Muscle weakness (generalized)  History of falling  Impaired functional mobility, balance, gait, and endurance  Rationale for Evaluation and Treatment: Rehabilitation  SUBJECTIVE:   SUBJECTIVE STATEMENT: Arrives with rollator. States he feels good and is less out of breath using the rollator vs the walker. States he was able to walk on gravel and grass without issue.   PERTINENT HISTORY:  right TTA 09/04/22, DM2, HLD, asthma, COPD, A-Fib, CAD, pacemaker, cardioversion & catheterization,  arthritis, Fibromyalgia, GERD, HTN,   PAIN:  Are you having pain? Yes: NPRS scale: 9/10 Right leg, 10/10 left leg Pain location: both legs Pain description: neuropathy, sharp, sticking "big" needles in leg Aggravating factors: meds wearing off,  standing & walking Relieving factors: pain pill 2x/day  Residual limb prosthesis on limb 0/10 - 10/10,  stand or walk 5/10 - 10/10  PRECAUTIONS: Fall and ICD/Pacemaker  WEIGHT BEARING RESTRICTIONS: No  FALLS: Has patient fallen in last 6 months? Yes. Number of falls 1 no injuries  LIVING ENVIRONMENT: Lives with: lives with their spouse and 3 dogs (mini schnauzer 15# indoor & 2 german [place] outdoor)  Lives in: [place] Home Access: Ramped entrance Home layout: One level Stairs: Yes: External: 6 steps; can reach both ramp at primary entrance Has following equipment at home: Single point cane, Environmental consultant - 2 wheeled, Wheelchair (manual), shower chair, Tour manager, and Ramped entry  OCCUPATION: retired from [employer]  PLOF: Independent, Independent with household mobility without device, and Independent with community mobility without device used oxygen as needed  PATIENT GOALS:  use prosthesis to function in community & in house, do odd repairs, yard work  OBJECTIVE:  COGNITION: Overall cognitive status: Within functional limits for tasks assessed   SENSATION: WFL  CARDIOVASCULAR RESPONSE: Functional activity: balance and gait assessments Pre-activity vitals: no oxygen seated in w/c HR: 67 SpO2: 93 Post-activity vitals: Berg with no oxygen  HR: 78 SpO2: 90-91%  94% on oxygen at rest, HR 70 After gait with O2 SpO2 94%, HR 73  Modified Borg scale for dyspnea: 2: mild shortness of breath  POSTURE: rounded shoulders, forward head, flexed trunk , and weight shift left  LOWER EXTREMITY ROM:  ROM P:passive  A:active Right eval Left eval  Hip flexion    Hip extension standing -10*    Hip abduction    Hip adduction    Hip  internal rotation    Hip external rotation    Knee flexion    Knee extension    Ankle dorsiflexion    Ankle plantarflexion    Ankle inversion    Ankle eversion     (Blank rows = not tested)  LOWER EXTREMITY MMT:  MMT Right eval Left eval  Hip flexion    Hip extension 3-4/5 3-4/5  Hip abduction  3-4/5 4/5  Hip adduction    Hip internal rotation    Hip external rotation    Knee flexion 3-4/5 4/5  Knee extension 3-4/5 4/5  Ankle dorsiflexion    Ankle plantarflexion    Ankle inversion    Ankle eversion    At Evaluation all strength testing is grossly seated and functionally standing / gait. (Blank rows = not tested)  TRANSFERS: Evaluation on 06/03/2023:    Sit to stand: SBA from 20" w/c using BUEs on armrests to arise stabilizes with back of legs against w/c Stand to sit: SBA uses BUEs on armrests of w/c to control descent  FUNCTIONAL TESTs:  Evaluation on 06/03/2023:   Berg Balance Scale: 19/56  GAIT: Evaluation on 06/03/2023:    Gait pattern: step to pattern, decreased step length- Left, decreased stance time- Right, decreased hip/knee flexion- Right, decreased ankle dorsiflexion- Right, Right hip hike, knee flexed in stance- Right, lateral hip instability, trunk flexed, and abducted- Right Distance walked: 25' Assistive device utilized: Environmental consultant - 2 wheeled and TTA prosthesis Level of assistance: Min A balance loss to left on straight path & 180* turn Comments: used O2 with portable unit slung over left shoulder,  pain in residual limb limited distance,   CURRENT PROSTHETIC WEAR ASSESSMENT: Evaluation on 06/03/2023:    Patient is dependent with: skin check, residual limb care, care of non-amputated limb, prosthetic cleaning, ply sock cleaning, correct ply sock adjustment, proper wear schedule/adjustment, and proper weight-bearing schedule/adjustment Donning prosthesis: dependent with PT 100% cues on technique Doffing prosthesis: reports modified independent Prosthetic wear  tolerance: 2-4 hours, 1x/day, ~3 days/week Prosthetic weight bearing tolerance: 5 minutes with partial weight on prosthesis with pain increasing to 8/10 (intolerable) Edema: pitting with 7 sec capillary refill Residual limb condition: dry skin especially distal limb, 3 scratches with superficial scab on distal limb, scar healed but adhered over entire area of distal tibia, bulbous shape,  Prosthetic description: silicon liner with pin lock suspension, total contact socket with flexible inner socket, dynamic response foot K code/activity level with prosthetic use: Level 3    TODAY'S TREATMENT:                                                                                                                             DATE:  07/16/2023: Prosthetic Care with TTA prosthesis: Patient educated on driving options with demo and handout. Patient and wife verbalize understanding Pt amb with his rollator walker Supervision 247ft before seated rest with 95% SpO2 and 60HR after rest. Continuing 183' for total of 438ft.  Sit to/from stand from rollator seat safely. After ambulation 95%SpO2 79HR  PT educated on use of supplemental oxygen that he reports MD ordered prn and would be ease to transport with rollator walker. Pt and wife verbalized understanding.  Cane amb at sink fwd/bwd CGA with cuing for proper sequencing and placement of LE with close supervision.    TREATMENT:  DATE:  07/14/2023: Prosthetic Care with TTA prosthesis: Pt amb with his rollator walker >200' and negotiated ramp & curb safely. Sit to/from stand from rollator seat safely.   PT instructed pt & wife in adjusting ply socks with verbal & tactile (too few, too many and correct ply) cues.  Pt and wife verbalized understanding.  PT recommended increasing wear to all awake hours except 2 hours midday.  Pt verbalized  understanding.  PT demo & verbal cues loading & unloading rollator in/out of car.  Pt & wife verbalized understanding.   Pt able to ambulate around car using BUEs on car safely.     TREATMENT:                                                                                                                             DATE:  07/09/2023: Prosthetic Care with TTA prosthesis: No issues with residual limb.  PT recommended wearing prosthesis from arising to bedtime except 2 hours midday.  PT educated on using prosthesis to enter / exit for shower.  Pt & wife verbalized understanding.  PT educated pt & wife verbal & demo in options for driving with Rt TTA.  Heavy recommendation to practice in middle of large empty parking lot. Pt & wife verbalized understanding.  Pt amb with rollator walker 100' and neg ramp/curb with sit/stand from rollator seat. Verbal cues only needed on position within rollator and to lock brakes for ramps/curbs.   PT demo & verbal cues on neg stairs.  Side stepping with BUEs on single rail: 4 steps with each rail with supervision.  Pt & wife verbalized understanding.    TREATMENT:                                                                                                                             DATE:  07/07/2023: Prosthetic Care with TTA prosthesis: -Pt's residual limb has smaller dry scabs on incision.  He is using mineral oil over patella to reduce friction from liner. PT recommended increasing 5 hours 2x/day with off 2 hours with shrinker between. Pt & wife verbalized understanding.  -pt amb 150' with RW with SBA. Verbal cues on upright posture, position within RW and step through pattern.  -PT demo & verbal cues on rollator walker use, safety, benefits and design options.  Pt amb 75' X 2 with rollator walker with supervision. He had improved fluency of  motion with control.  Pt able to sit to/from stand from rollator seat with PT lightly stabilizing rollator walker.  -PT  demo & verbal cues on rollator walker use with negotiating ramps & curbs.  Pt neg 6.5" curb and 12* incline with rollator walker with CGA.      PATIENT EDUCATION: PATIENT EDUCATED ON FOLLOWING PROSTHETIC CARE: Education details:  Skin check, Residual limb care, Prosthetic cleaning, Correct ply sock adjustment, Propper donning, and Proper wear schedule/adjustment Prosthetic wear tolerance: 2 hours 2x/day, 7 days/week Person educated: Patient and Spouse Education method: Explanation, Demonstration, Tactile cues, and Verbal cues Education comprehension: verbalized understanding, verbal cues required, tactile cues required, and needs further education  HOME EXERCISE PROGRAM: Printed and issued 06/25/23 Do each exercise 1-2  times per day Do each exercise 5-10 repetitions Hold each exercise for 2 seconds to feel your location  AT SINK FIND YOUR MIDLINE POSITION AND PLACE FEET EQUAL DISTANCE FROM THE MIDLINE.  Try to find this position when standing still for activities.   USE TAPE ON FLOOR TO MARK THE MIDLINE POSITION which is even with middle of sink.  You also should try to feel with your limb pressure in socket.  You are trying to feel with limb what you used to feel with the bottom of your foot.  Side to Side Shift: Moving your hips only (not shoulders): move weight onto your left leg, HOLD/FEEL pressure in socket.  Move back to equal weight on each leg, HOLD/FEEL pressure in socket. Move weight onto your right leg, HOLD/FEEL pressure in socket. Move back to equal weight on each leg, HOLD/FEEL pressure in socket. Repeat.  Start with both hands on sink, progress to hand on prosthetic side only, then no hands.  Front to Back Shift: Moving your hips only (not shoulders): move your weight forward onto your toes, HOLD/FEEL pressure in socket. Move your weight back to equal Flat Foot on both legs, HOLD/FEEL  pressure in socket. Move your weight back onto your heels, HOLD/FEEL  pressure in socket. Move  your weight back to equal on both legs, HOLD/FEEL  pressure in socket. Repeat.  Start with both hands on sink, progress to hand on prosthetic side only, then no hands.  Moving Cones / Cups: With equal weight on each leg: Hold on with one hand the first time, then progress to no hand supports. Move cups from one side of sink to the other. Place cups ~2" out of your reach, progress to 10" beyond reach.  Place one hand in middle of sink and reach with other hand. Do both arms.  Then hover one hand and move cups with other hand.  Overhead/Upward Reaching: alternated reaching up to top cabinets or ceiling if no cabinets present. Keep equal weight on each leg. Start with one hand support on counter while other hand reaches and progress to no hand support with reaching.  ace one hand in middle of sink and reach with other hand. Do both arms.  Then hover one hand and move cups with other hand.  5.   Looking Over Shoulders: With equal weight on each leg: alternate turning to look over your shoulders with one hand support on counter as needed.  Start with head motions only to look in front of shoulder, then even with shoulder and progress to looking behind you. To look to side, move head /eyes, then shoulder on side looking pulls back, shift more weight to side looking and pull hip back. Place one hand in  middle of sink and let go with other hand so your shoulder can pull back. Switch hands to look other way.   Then hover one hand and look over shoulder. If looking right, use left hand at sink. If looking left, use right hand at sink. 6.  Stepping with leg that is not amputated:  Move items under cabinet out of your way. Shift your hips/pelvis so weight on prosthesis. Tighten muscles in hip on prosthetic side.  SLOWLY step other leg so front of foot is in cabinet. Then step back to floor.   ASSESSMENT:  CLINICAL IMPRESSION: Patient able to increase ambulation distance before requiring rest break. Made progression to  ambulation at sink with cane however will not add to HEP until patient demonstrates increased safety and awareness of LE location. Patient continues to benefit from skilled physical therapy.    OBJECTIVE IMPAIRMENTS: Abnormal gait, cardiopulmonary status limiting activity, decreased activity tolerance, decreased balance, decreased endurance, decreased knowledge of condition, decreased knowledge of use of DME, decreased mobility, difficulty walking, decreased ROM, decreased strength, increased edema, prosthetic dependency , and pain.   ACTIVITY LIMITATIONS: carrying, lifting, bending, standing, stairs, transfers, and locomotion level  PARTICIPATION LIMITATIONS: meal prep, cleaning, driving, community activity, and yard work  PERSONAL FACTORS: Fitness, Time since onset of injury/illness/exacerbation, and 3+ comorbidities: see PMH  are also affecting patient's functional outcome.   REHAB POTENTIAL: Good  CLINICAL DECISION MAKING: Evolving/moderate complexity  EVALUATION COMPLEXITY: Moderate   GOALS: Goals reviewed with patient? Yes  SHORT TERM GOALS: Target date: 07/02/2023  Patient donnes prosthesis modified independent & verbalizes proper cleaning. Baseline: SEE OBJECTIVE DATA Goal status:   MET 06/30/2023 2.  Patient tolerates prosthesis >6 hrs total /day without skin issues or limb pain >5/10 after standing. Baseline: SEE OBJECTIVE DATA Goal status: Partially MET 06/30/2023  3.  Patient able to reach 5" and looks to sides without UE support with supervision. Baseline: SEE OBJECTIVE DATA Goal status: MET 07/07/2023  4. Patient ambulates 71' with RW & prosthesis with supervision. Baseline: SEE OBJECTIVE DATA Goal status: MET 07/07/2023  5. Patient negotiates ramps & curbs with RW & prosthesis with minA. Baseline: SEE OBJECTIVE DATA Goal status: MET 06/23/2023  LONG TERM GOALS: Target date: 08/28/2023  Patient demonstrates & verbalized understanding of prosthetic care to enable safe  utilization of prosthesis. Baseline: SEE OBJECTIVE DATA Goal status:   Ongoing 06/30/2023  Patient tolerates prosthesis wear >90% of awake hours without skin or limb pain issues. Baseline: SEE OBJECTIVE DATA Goal status: Ongoing 06/30/2023  Berg Balance >36/56 to indicate lower fall risk Baseline: SEE OBJECTIVE DATA Goal status: Ongoing 06/30/2023  Patient ambulates >300' with prosthesis & LRAD independently Baseline: SEE OBJECTIVE DATA Goal status: Ongoing 06/30/2023  Patient negotiates ramps, curbs & stairs with single rail with prosthesis & LRAD independently. Baseline: SEE OBJECTIVE DATA Goal status: Ongoing 06/30/2023   PLAN:  PT FREQUENCY: 2x/week  PT DURATION: 12 weeks  PLANNED INTERVENTIONS: 97164- PT Re-evaluation, 97110-Therapeutic exercises, 97530- Therapeutic activity, 97112- Neuromuscular re-education, [phone]- Self Care, 56213- Gait training, [phone]- Prosthetic training, Patient/Family education, Balance training, Stair training, Scar mobilization, Vestibular training, DME instructions, and Physical Performance Testing  PLAN FOR NEXT SESSION:  Continue sink ambulation with cane, progress ambulation tolerance, discussion of short/med/long walk.     Date of referral: 04/17/2023  PT eval delayed by COPD exacerbation Referring provider: Aldean Baker, MD Referring diagnosis? Z89.511 (ICD-10-CM) - S/P BKA (below knee amputation), right Treatment diagnosis? (if different than referring diagnosis)   Other abnormalities  of gait & mobility R26.89 Unsteadiness on feet R26.81 Pain in left leg M79.605 Pain in right leg M79.604 Muscle Weakness (generalized) M62.81 History of falling Z91.81 Impaired functional mobility, balance, gait and endurance  What was this (referring dx) caused by? Surgery (Type: right BKA)  Nature of Condition: Initial Onset (within last 3 months)  prosthesis delivery 04/01/2023   Laterality: Rt  Current Functional Measure Score: Other Berg Balance  19/56  Objective measurements identify impairments when they are compared to normal values, the uninvolved extremity, and prior level of function.  [x]  Yes  []  No  Objective assessment of functional ability: Severe functional limitations   Briefly describe symptoms: dependent in care & use of prosthesis, impaired balance and gait, pain in both legs  How did symptoms start: amputation from wounds  Average pain intensity:  Last 24 hours: 8/10  Past week: 8/10  How often does the pt experience symptoms? Constantly  How much have the symptoms interfered with usual daily activities? Extremely  How has condition changed since care began at this facility? NA - initial visit  In general, how is the patients overall health? Good   BACK PAIN (STarT Back Screening Tool) No   Terressa Evola, Cal Gindlesperger, Student-PT 07/16/2023, 12:30 PM  This entire session of physical therapy was performed under the direct supervision of PT signing evaluation /treatment. PT reviewed note and agrees.   Vladimir Faster, PT, DPT 07/16/2023, 5:26 PM

## 2023-07-21 ENCOUNTER — Ambulatory Visit: Payer: Medicare Other | Admitting: Physical Therapy

## 2023-07-21 ENCOUNTER — Encounter: Payer: Self-pay | Admitting: Physical Therapy

## 2023-07-21 ENCOUNTER — Ambulatory Visit: Payer: Medicare Other | Admitting: Orthopedic Surgery

## 2023-07-21 DIAGNOSIS — Z89511 Acquired absence of right leg below knee: Secondary | ICD-10-CM | POA: Diagnosis not present

## 2023-07-21 DIAGNOSIS — M79605 Pain in left leg: Secondary | ICD-10-CM

## 2023-07-21 DIAGNOSIS — M79604 Pain in right leg: Secondary | ICD-10-CM

## 2023-07-21 DIAGNOSIS — M6281 Muscle weakness (generalized): Secondary | ICD-10-CM

## 2023-07-21 DIAGNOSIS — I87332 Chronic venous hypertension (idiopathic) with ulcer and inflammation of left lower extremity: Secondary | ICD-10-CM

## 2023-07-21 DIAGNOSIS — R2681 Unsteadiness on feet: Secondary | ICD-10-CM

## 2023-07-21 DIAGNOSIS — R2689 Other abnormalities of gait and mobility: Secondary | ICD-10-CM | POA: Diagnosis not present

## 2023-07-21 NOTE — Patient Instructions (Addendum)
 Increasing your activity level is important.  Short distances which is walking from one room to another. Work to increase frequency back to prior level.  Medium distances are entering & exiting your home or community with limited distances. Start with 4 medium walks which is one outing to one location and increase number of tolerated amounts per day.  Long distance is your highest tolerance for you. Walk until you feel you must rest. Back or leg pain or general fatigue are indicators to maximum tolerance. Monitor by distance or time. Try to walk your BEST distance 1-2 times per day. You should see this increase over time.

## 2023-07-21 NOTE — Therapy (Cosign Needed)
 OUTPATIENT PHYSICAL THERAPY PROSTHETIC TREATMENT   Patient Name: [name] MRN: [id number] DOB:20-Apr-1951, 73 y.o., male Today's Date: 07/22/2023  END OF SESSION:  PT End of Session - 07/21/23 1149     Visit Number 9    Number of Visits 25    Date for PT Re-Evaluation 08/28/23    Authorization Type [employer] Medicare    Authorization Time Period [employer] MEDICARE $20 COPAY 16 PT VISITS APPROVED 2/4-5/27/25    Authorization - Visit Number 9    Authorization - Number of Visits 16    Progress Note Due on Visit 10    PT Start Time 1145    PT Stop Time 1229    PT Time Calculation (min) 44 min    Equipment Utilized During Treatment Gait belt    Activity Tolerance Patient tolerated treatment well;Patient limited by pain;Patient limited by fatigue    Behavior During Therapy [facility] for tasks assessed/performed                  Past Medical History:  Diagnosis Date   Aortic stenosis, mild    Arthritis of hand    "just a little bit in both hands" (03/31/2012)   Asthma    "little bit" (03/31/2012)   Atrial fibrillation (HCC)    dx '04; DCCV '04, placed on flecainide, failed DCCV 04/2010, flecainide stopped; s/p successful A.fib ablation 01/31/12   CHF (congestive heart failure) (HCC)    Controlled type 2 diabetes mellitus without complication, without long-term current use of insulin (HCC) 12/13/2019   COPD (chronic obstructive pulmonary disease) (HCC)    DJD (degenerative joint disease)    Fatty liver    Fibromyalgia    GERD (gastroesophageal reflux disease)    Hyperlipidemia    Hypertension    Hypothyroidism    S/P radiation   Left atrial enlargement    LA size 45mm by echo 11/21/11   Mitral regurgitation    trivial   Obstructive sleep apnea    mild by sleep study 2013; pt stated he does not have a machine because "it wasn't bad enough for him to have one"   Pacemaker 03/31/2012   Pneumonia    last time 2023   Restless leg syndrome    Second degree AV block     Splenomegaly    Past Surgical History:  Procedure Laterality Date   AMPUTATION Right 08/23/2022   Procedure: RIGHT FOOT FIFTH RAY AMPUTATION;  Surgeon: [name], MD;  Location: [facility] OR;  Service: Orthopedics;  Laterality: Right;   AMPUTATION Right 09/04/2022   Procedure: RIGHT BELOW KNEE AMPUTATION;  Surgeon: [name], MD;  Location: [facility] OR;  Service: Orthopedics;  Laterality: Right;   AORTIC VALVE REPLACEMENT N/A 08/22/2015   Procedure: AORTIC VALVE REPLACEMENT (AVR);  Surgeon: Kerin Perna, MD;  Location: [facility] OR;  Service: Open Heart Surgery;  Laterality: N/A;   ATRIAL FIBRILLATION ABLATION  01/30/2012   PVI by Dr. Johney Frame   ATRIAL FIBRILLATION ABLATION N/A 01/31/2012   Procedure: ATRIAL FIBRILLATION ABLATION;  Surgeon: [name], MD;  Location: [facility] CATH LAB;  Service: Cardiovascular;  Laterality: N/A;   ATRIAL FIBRILLATION ABLATION N/A 04/19/2020   Procedure: ATRIAL FIBRILLATION ABLATION;  Surgeon: [name], MD;  Location: [facility] INVASIVE CV LAB;  Service: Cardiovascular;  Laterality: N/A;   BACK SURGERY     X 3   BIOPSY  01/01/2022   Procedure: BIOPSY;  Surgeon: Kathi Der, MD;  Location: [facility] ENDOSCOPY;  Service: Gastroenterology;;  CARDIAC CATHETERIZATION N/A 08/09/2015   Procedure: Right/Left Heart Cath and Coronary Angiography;  Surgeon: Peter M Swaziland, MD;  Location: [facility] INVASIVE CV LAB;  Service: Cardiovascular;  Laterality: N/A;   CARDIAC CATHETERIZATION N/A 02/05/2016   Procedure: Right/Left Heart Cath and Coronary Angiography;  Surgeon: Corky Crafts, MD;  Location: [facility] INVASIVE CV LAB;  Service: Cardiovascular;  Laterality: N/A;   CARDIAC CATHETERIZATION N/A 04/17/2016   Procedure: Right Heart Cath;  Surgeon: Dolores Patty, MD;  Location: [facility] INVASIVE CV LAB;  Service: Cardiovascular;  Laterality: N/A;   CARDIOVASCULAR STRESS TEST  03/12/2002   EF 48%, NO EVIDENCE OF ISCHEMIA   CARDIOVERSION  01/2012; 03/31/2012   CARDIOVERSION N/A 02/25/2012    Procedure: CARDIOVERSION;  Surgeon: [name], MD;  Location: [facility] CATH LAB;  Service: Cardiovascular;  Laterality: N/A;   CARDIOVERSION N/A 03/31/2012   Procedure: CARDIOVERSION;  Surgeon: [name], MD;  Location: [facility] CATH LAB;  Service: Cardiovascular;  Laterality: N/A;   CARDIOVERSION N/A 11/23/2015   Procedure: CARDIOVERSION;  Surgeon: Laurey Morale, MD;  Location: [facility] ENDOSCOPY;  Service: Cardiovascular;  Laterality: N/A;   CARDIOVERSION N/A 04/08/2016   Procedure: CARDIOVERSION;  Surgeon: Dolores Patty, MD;  Location: [facility] ENDOSCOPY;  Service: Cardiovascular;  Laterality: N/A;   CARDIOVERSION N/A 09/14/2018   Procedure: CARDIOVERSION;  Surgeon: Wendall Stade, MD;  Location: [facility] ENDOSCOPY;  Service: Cardiovascular;  Laterality: N/A;   CARDIOVERSION N/A 10/19/2019   Procedure: CARDIOVERSION;  Surgeon: Dolores Patty, MD;  Location: [facility] ENDOSCOPY;  Service: Cardiovascular;  Laterality: N/A;   CARDIOVERSION N/A 02/25/2020   Procedure: CARDIOVERSION;  Surgeon: Pricilla Riffle, MD;  Location: [facility] ENDOSCOPY;  Service: Cardiovascular;  Laterality: N/A;   CARDIOVERSION N/A 05/25/2020   Procedure: CARDIOVERSION;  Surgeon: Lewayne Bunting, MD;  Location: [facility] ENDOSCOPY;  Service: Cardiovascular;  Laterality: N/A;   CARDIOVERSION N/A 07/10/2020   Procedure: CARDIOVERSION;  Surgeon: Jake Bathe, MD;  Location: [facility] ENDOSCOPY;  Service: Cardiovascular;  Laterality: N/A;   CLIPPING OF ATRIAL APPENDAGE N/A 08/22/2015   Procedure: CLIPPING OF ATRIAL APPENDAGE;  Surgeon: Kerin Perna, MD;  Location: [facility] OR;  Service: Open Heart Surgery;  Laterality: N/A;   COLONOSCOPY WITH PROPOFOL N/A 01/01/2022   Procedure: COLONOSCOPY WITH PROPOFOL;  Surgeon: Kathi Der, MD;  Location: [facility] ENDOSCOPY;  Service: Gastroenterology;  Laterality: N/A;   DOPPLER ECHOCARDIOGRAPHY  03/11/2002   EF 70-75%   FINGER SURGERY Left    Middle finger   FINGER TENDON REPAIR  1980's   "right little finger" (03/31/2012)    FLEXIBLE SIGMOIDOSCOPY N/A 12/30/2021   Procedure: FLEXIBLE SIGMOIDOSCOPY;  Surgeon: Charlott Rakes, MD;  Location: [facility] ENDOSCOPY;  Service: Gastroenterology;  Laterality: N/A;   INSERT / REPLACE / REMOVE PACEMAKER     St Jude   IR RADIOLOGIST EVAL & MGMT  05/06/2019   IR RADIOLOGIST EVAL & MGMT  10/06/2019   IR RADIOLOGIST EVAL & MGMT  10/31/2020   IR RADIOLOGIST EVAL & MGMT  01/18/2021   IR RADIOLOGIST EVAL & MGMT  03/08/2021   IR RADIOLOGIST EVAL & MGMT  06/13/2021   KIDNEY SURGERY Left    LEFT AND RIGHT HEART CATHETERIZATION WITH CORONARY ANGIOGRAM N/A 10/27/2012   Procedure: LEFT AND RIGHT HEART CATHETERIZATION WITH CORONARY ANGIOGRAM;  Surgeon: Pamella Pert, MD;  Location: [facility] CATH LAB;  Service: Cardiovascular;  Laterality: N/A;   LUMBAR DISC SURGERY  1980's X2;  2000's   MAZE N/A 08/22/2015   Procedure: MAZE;  Surgeon: Kathlee Nations Trigt,  MD;  Location: [facility] OR;  Service: Open Heart Surgery;  Laterality: N/A;   PACEMAKER INSERTION  03/31/2012   STJ Accent DR pacemaker implanted by Dr Johney Frame   PERMANENT PACEMAKER INSERTION N/A 03/31/2012   Procedure: PERMANENT PACEMAKER INSERTION;  Surgeon: [name], MD;  Location: [facility] CATH LAB;  Service: Cardiovascular;  Laterality: N/A;   PPM GENERATOR CHANGEOUT N/A 08/08/2020   Procedure: PPM GENERATOR CHANGEOUT;  Surgeon: [name], MD;  Location: [facility] INVASIVE CV LAB;  Service: Cardiovascular;  Laterality: N/A;   RADIOLOGY WITH ANESTHESIA Left 02/14/2021   Procedure: RADIOLOGY WITH ANESTHESIA LEFT RENAL CRYOABLATION;  Surgeon: Irish Lack, MD;  Location: [facility] ORS;  Service: Radiology;  Laterality: Left;   STUMP REVISION Right 10/09/2022   Procedure: REVISION RIGHT BELOW KNEE AMPUTATION;  Surgeon: [name], MD;  Location: [facility] OR;  Service: Orthopedics;  Laterality: Right;   STUMP REVISION Right 11/16/2022   Procedure: REVISION RIGHT BELOW KNEE AMPUTATION;  Surgeon: [name], MD;  Location: [facility] OR;  Service: Orthopedics;   Laterality: Right;   TEE WITHOUT CARDIOVERSION  01/30/2012   Procedure: TRANSESOPHAGEAL ECHOCARDIOGRAM (TEE);  Surgeon: Laurey Morale, MD;  Location: [facility] ENDOSCOPY;  Service: Cardiovascular;  Laterality: N/A;  ablation next day   TEE WITHOUT CARDIOVERSION N/A 08/22/2015   Procedure: TRANSESOPHAGEAL ECHOCARDIOGRAM (TEE);  Surgeon: Kerin Perna, MD;  Location: [facility] OR;  Service: Open Heart Surgery;  Laterality: N/A;   TEE WITHOUT CARDIOVERSION N/A 10/19/2019   Procedure: TRANSESOPHAGEAL ECHOCARDIOGRAM (TEE);  Surgeon: Dolores Patty, MD;  Location: [facility] ENDOSCOPY;  Service: Cardiovascular;  Laterality: N/A;   US ECHOCARDIOGRAPHY  08/07/2009   EF 55-60%   Patient Active Problem List   Diagnosis Date Noted   AKI (acute kidney injury) (HCC) 12/22/2022   Fall 11/14/2022   Pain of right lower extremity due to injury 11/14/2022   Diabetic ulcer of lower leg (HCC) 11/13/2022   Osteomyelitis (HCC) 11/13/2022   Dyslipidemia 10/18/2022   Cellulitis of right lower extremity 10/17/2022   Dehiscence of amputation stump of right lower extremity (HCC) 10/09/2022   Below-knee amputation of right lower extremity (HCC) 10/09/2022   Gangrene of right foot (HCC) 09/04/2022   History of below-knee amputation of right lower extremity (HCC) 09/04/2022   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) 08/23/2022   CAP (community acquired pneumonia) 12/28/2021   Constipation 12/28/2021   Pain in both lower extremities 07/03/2021   Peripheral neuropathy 07/03/2021   Acute metabolic encephalopathy 06/09/2021   Marijuana dependence (HCC) 06/09/2021   DNR (do not resuscitate) 06/09/2021   Chest pain 06/08/2021   Renal lesion 02/14/2021   Status post cryoablation 02/14/2021   Allergic rhinitis 12/08/2020   Preop pulmonary/respiratory exam 03/27/2020   Acute on chronic diastolic (congestive) heart failure (HCC) 01/22/2020   Abdominal pain 01/22/2020   Hypothyroidism 12/13/2019   DM2 (diabetes mellitus,  type 2) (HCC) 12/13/2019   Chronic kidney disease, stage 3b (HCC) 12/13/2019   Acute bacterial bronchitis 12/13/2019   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   Typical atrial flutter (HCC)    Chronic respiratory failure with hypoxia (HCC) 05/23/2016   CAD (coronary artery disease) 05/23/2016   Persistent atrial fibrillation (HCC)    Atypical atrial flutter (HCC)    Visit for monitoring Tikosyn therapy 04/16/2016   Chronic diastolic CHF (congestive heart failure) (HCC) 02/03/2016   Cough 12/06/2015   Acute on chronic respiratory failure with hypoxia (HCC) 09/08/2015   Hallucinations 09/04/2015   Lymphadenopathy 09/04/2015   Ascending aortic  aneurysm (HCC) 09/04/2015   Hypoxia 09/02/2015   S/P aortic valve replacement with bioprosthetic valve 09/02/2015   Pleural effusion 09/02/2015   S/P AVR 08/22/2015   GERD without esophagitis 09/20/2014   Intrinsic asthma 10/13/2013   Dyspnea 09/15/2013   Pulmonary hypertension (HCC) 09/15/2013   Shortness of breath 05/05/2013   Pacemaker-St.Jude 04/01/2012   Bradycardia 03/31/2012   Second degree AV block 03/31/2012   AV block, 1st degree 02/01/2012   PAF (paroxysmal atrial fibrillation) (HCC) 11/13/2011   Fatigue 11/13/2011   Essential hypertension 11/13/2011   Aortic stenosis 11/13/2011   Sleep apnea 11/13/2011    PCP: Alysia Penna, MD  REFERRING PROVIDER: Aldean Baker, MD  ONSET DATE: 04/01/2023 prosthesis delivery  REFERRING DIAG: Z89.511 (ICD-10-CM) - S/P BKA (below knee amputation), right   THERAPY DIAG:  Other abnormalities of gait and mobility  Unsteadiness on feet  Pain in right leg  Pain in left leg  Muscle weakness (generalized)  Rationale for Evaluation and Treatment: Rehabilitation  SUBJECTIVE:   SUBJECTIVE STATEMENT: Arrives with rollator. States his legs are 10/10 pain from neuropathy today. Would like to continue therapy sessions so that he can walk in the house with a cane.   PERTINENT HISTORY:  right TTA  09/04/22, DM2, HLD, asthma, COPD, A-Fib, CAD, pacemaker, cardioversion & catheterization, arthritis, Fibromyalgia, GERD, HTN,   PAIN:  Are you having pain? Yes: NPRS scale: 9/10 Right leg, 10/10 left leg Pain location: both legs Pain description: neuropathy, sharp, sticking "big" needles in leg Aggravating factors: meds wearing off,  standing & walking Relieving factors: pain pill 2x/day  Residual limb prosthesis on limb 0/10 - 10/10,  stand or walk 5/10 - 10/10  PRECAUTIONS: Fall and ICD/Pacemaker  WEIGHT BEARING RESTRICTIONS: No  FALLS: Has patient fallen in last 6 months? Yes. Number of falls 1 no injuries  LIVING ENVIRONMENT: Lives with: lives with their spouse and 3 dogs (mini schnauzer 15# indoor & 2 german [place] outdoor)  Lives in: [place] Home Access: Ramped entrance Home layout: One level Stairs: Yes: External: 6 steps; can reach both ramp at primary entrance Has following equipment at home: Single point cane, Environmental consultant - 2 wheeled, Wheelchair (manual), shower chair, Tour manager, and Ramped entry  OCCUPATION: retired from [employer]  PLOF: Independent, Independent with household mobility without device, and Independent with community mobility without device used oxygen as needed  PATIENT GOALS:  use prosthesis to function in community & in house, do odd repairs, yard work  OBJECTIVE:  COGNITION: Overall cognitive status: Within functional limits for tasks assessed   SENSATION: WFL  CARDIOVASCULAR RESPONSE: Functional activity: balance and gait assessments Pre-activity vitals: no oxygen seated in w/c HR: 67 SpO2: 93 Post-activity vitals: Berg with no oxygen  HR: 78 SpO2: 90-91%  94% on oxygen at rest, HR 70 After gait with O2 SpO2 94%, HR 73  Modified Borg scale for dyspnea: 2: mild shortness of breath  POSTURE: rounded shoulders, forward head, flexed trunk , and weight shift left  LOWER EXTREMITY ROM:  ROM P:passive  A:active Right eval  Hip flexion    Hip extension standing -10*   Hip abduction   Hip adduction   Hip internal rotation   Hip external rotation   Knee flexion   Knee extension   Ankle dorsiflexion   Ankle plantarflexion   Ankle inversion   Ankle eversion    (Blank rows = not tested)  LOWER EXTREMITY MMT:  MMT Right eval Left eval  Hip flexion  Hip extension 3-4/5 3-4/5  Hip abduction 3-4/5 4/5  Hip adduction    Hip internal rotation    Hip external rotation    Knee flexion 3-4/5 4/5  Knee extension 3-4/5 4/5  Ankle dorsiflexion    Ankle plantarflexion    Ankle inversion    Ankle eversion    At Evaluation all strength testing is grossly seated and functionally standing / gait. (Blank rows = not tested)  TRANSFERS: Evaluation on 06/03/2023:    Sit to stand: SBA from 20" w/c using BUEs on armrests to arise stabilizes with back of legs against w/c Stand to sit: SBA uses BUEs on armrests of w/c to control descent  FUNCTIONAL TESTs:  Evaluation on 06/03/2023:   Berg Balance Scale: 19/56  GAIT: Evaluation on 06/03/2023:    Gait pattern: step to pattern, decreased step length- Left, decreased stance time- Right, decreased hip/knee flexion- Right, decreased ankle dorsiflexion- Right, Right hip hike, knee flexed in stance- Right, lateral hip instability, trunk flexed, and abducted- Right Distance walked: 25' Assistive device utilized: Environmental consultant - 2 wheeled and TTA prosthesis Level of assistance: Min A balance loss to left on straight path & 180* turn Comments: used O2 with portable unit slung over left shoulder,  pain in residual limb limited distance,   CURRENT PROSTHETIC WEAR ASSESSMENT: Evaluation on 06/03/2023:    Patient is dependent with: skin check, residual limb care, care of non-amputated limb, prosthetic cleaning, ply sock cleaning, correct ply sock adjustment, proper wear schedule/adjustment, and proper weight-bearing schedule/adjustment Donning prosthesis: dependent with PT 100% cues on  technique Doffing prosthesis: reports modified independent Prosthetic wear tolerance: 2-4 hours, 1x/day, ~3 days/week Prosthetic weight bearing tolerance: 5 minutes with partial weight on prosthesis with pain increasing to 8/10 (intolerable) Edema: pitting with 7 sec capillary refill Residual limb condition: dry skin especially distal limb, 3 scratches with superficial scab on distal limb, scar healed but adhered over entire area of distal tibia, bulbous shape,  Prosthetic description: silicon liner with pin lock suspension, total contact socket with flexible inner socket, dynamic response foot K code/activity level with prosthetic use: Level 3    TODAY'S TREATMENT:                                                                                                                             DATE:  07/21/2023: Prosthetic Care with TTA prosthesis: -Ambulation with cane at sink fwd and bwd walking MinA for wt shift at hip, vc for UE support and sequencing. Patient requiring 2 long seated rest breaks between bouts. Demonstrated good and safe use of rollator.  -PT demo & verbal cues on technique: Ambulation with cane at sink side stepping CGA with patient using RUE to hover over sink. Required seated rest break between bouts.    -Educated on short/medium/long walk for activity tolerance, handout provided with patient and wife verbalizing understanding.  -throughout session patient performed STS from rollator demonstrating good safety and balance when standing/sitting/moving rollator  into position.    TREATMENT:                                                                                                                             DATE:  07/16/2023: Prosthetic Care with TTA prosthesis: Patient educated on driving options with demo and handout. Patient and wife verbalize understanding Pt amb with his rollator walker Supervision 295ft before seated rest with 95% SpO2 and 60HR after rest.  Continuing 183' for total of 443ft.  Sit to/from stand from rollator seat safely. After ambulation 95%SpO2 79HR  PT educated on use of supplemental oxygen that he reports MD ordered prn and would be ease to transport with rollator walker. Pt and wife verbalized understanding.  Cane amb at sink fwd/bwd CGA with cuing for proper sequencing and placement of LE with close supervision.    TREATMENT:                                                                                                                             DATE:  07/14/2023: Prosthetic Care with TTA prosthesis: Pt amb with his rollator walker >200' and negotiated ramp & curb safely. Sit to/from stand from rollator seat safely.   PT instructed pt & wife in adjusting ply socks with verbal & tactile (too few, too many and correct ply) cues.  Pt and wife verbalized understanding.  PT recommended increasing wear to all awake hours except 2 hours midday.  Pt verbalized understanding.  PT demo & verbal cues loading & unloading rollator in/out of car.  Pt & wife verbalized understanding.   Pt able to ambulate around car using BUEs on car safely.     TREATMENT:  DATE:  07/09/2023: Prosthetic Care with TTA prosthesis: No issues with residual limb.  PT recommended wearing prosthesis from arising to bedtime except 2 hours midday.  PT educated on using prosthesis to enter / exit for shower.  Pt & wife verbalized understanding.  PT educated pt & wife verbal & demo in options for driving with Rt TTA.  Heavy recommendation to practice in middle of large empty parking lot. Pt & wife verbalized understanding.  Pt amb with rollator walker 100' and neg ramp/curb with sit/stand from rollator seat. Verbal cues only needed on position within rollator and to lock brakes for ramps/curbs.   PT demo & verbal cues on neg stairs.  Side stepping  with BUEs on single rail: 4 steps with each rail with supervision.  Pt & wife verbalized understanding.      PATIENT EDUCATION: PATIENT EDUCATED ON FOLLOWING PROSTHETIC CARE: Education details:  Skin check, Residual limb care, Prosthetic cleaning, Correct ply sock adjustment, Propper donning, and Proper wear schedule/adjustment Prosthetic wear tolerance: 2 hours 2x/day, 7 days/week Person educated: Patient and Spouse Education method: Explanation, Demonstration, Tactile cues, and Verbal cues Education comprehension: verbalized understanding, verbal cues required, tactile cues required, and needs further education  HOME EXERCISE PROGRAM: Printed and issued 06/25/23 Do each exercise 1-2  times per day Do each exercise 5-10 repetitions Hold each exercise for 2 seconds to feel your location  AT SINK FIND YOUR MIDLINE POSITION AND PLACE FEET EQUAL DISTANCE FROM THE MIDLINE.  Try to find this position when standing still for activities.   USE TAPE ON FLOOR TO MARK THE MIDLINE POSITION which is even with middle of sink.  You also should try to feel with your limb pressure in socket.  You are trying to feel with limb what you used to feel with the bottom of your foot.  Side to Side Shift: Moving your hips only (not shoulders): move weight onto your left leg, HOLD/FEEL pressure in socket.  Move back to equal weight on each leg, HOLD/FEEL pressure in socket. Move weight onto your right leg, HOLD/FEEL pressure in socket. Move back to equal weight on each leg, HOLD/FEEL pressure in socket. Repeat.  Start with both hands on sink, progress to hand on prosthetic side only, then no hands.  Front to Back Shift: Moving your hips only (not shoulders): move your weight forward onto your toes, HOLD/FEEL pressure in socket. Move your weight back to equal Flat Foot on both legs, HOLD/FEEL  pressure in socket. Move your weight back onto your heels, HOLD/FEEL  pressure in socket. Move your weight back to equal on both  legs, HOLD/FEEL  pressure in socket. Repeat.  Start with both hands on sink, progress to hand on prosthetic side only, then no hands.  Moving Cones / Cups: With equal weight on each leg: Hold on with one hand the first time, then progress to no hand supports. Move cups from one side of sink to the other. Place cups ~2" out of your reach, progress to 10" beyond reach.  Place one hand in middle of sink and reach with other hand. Do both arms.  Then hover one hand and move cups with other hand.  Overhead/Upward Reaching: alternated reaching up to top cabinets or ceiling if no cabinets present. Keep equal weight on each leg. Start with one hand support on counter while other hand reaches and progress to no hand support with reaching.  ace one hand in middle of sink and reach with other hand. Do both  arms.  Then hover one hand and move cups with other hand.  5.   Looking Over Shoulders: With equal weight on each leg: alternate turning to look over your shoulders with one hand support on counter as needed.  Start with head motions only to look in front of shoulder, then even with shoulder and progress to looking behind you. To look to side, move head /eyes, then shoulder on side looking pulls back, shift more weight to side looking and pull hip back. Place one hand in middle of sink and let go with other hand so your shoulder can pull back. Switch hands to look other way.   Then hover one hand and look over shoulder. If looking right, use left hand at sink. If looking left, use right hand at sink. 6.  Stepping with leg that is not amputated:  Move items under cabinet out of your way. Shift your hips/pelvis so weight on prosthesis. Tighten muscles in hip on prosthetic side.  SLOWLY step other leg so front of foot is in cabinet. Then step back to floor.   ASSESSMENT:  CLINICAL IMPRESSION: Patient neuropathy was high today during session however he was able to demonstrate good safety with rollator use for seated  rest breaks during cane ambulation activity at sink. He improved activities with cane & counter but will need further education with PT. Patient continues to benefit from skilled physical therapy.    OBJECTIVE IMPAIRMENTS: Abnormal gait, cardiopulmonary status limiting activity, decreased activity tolerance, decreased balance, decreased endurance, decreased knowledge of condition, decreased knowledge of use of DME, decreased mobility, difficulty walking, decreased ROM, decreased strength, increased edema, prosthetic dependency , and pain.   ACTIVITY LIMITATIONS: carrying, lifting, bending, standing, stairs, transfers, and locomotion level  PARTICIPATION LIMITATIONS: meal prep, cleaning, driving, community activity, and yard work  PERSONAL FACTORS: Fitness, Time since onset of injury/illness/exacerbation, and 3+ comorbidities: see PMH  are also affecting patient's functional outcome.   REHAB POTENTIAL: Good  CLINICAL DECISION MAKING: Evolving/moderate complexity  EVALUATION COMPLEXITY: Moderate   GOALS: Goals reviewed with patient? Yes  SHORT TERM GOALS: Target date: 07/02/2023  Patient donnes prosthesis modified independent & verbalizes proper cleaning. Baseline: SEE OBJECTIVE DATA Goal status:   MET 06/30/2023 2.  Patient tolerates prosthesis >6 hrs total /day without skin issues or limb pain >5/10 after standing. Baseline: SEE OBJECTIVE DATA Goal status: Partially MET 06/30/2023  3.  Patient able to reach 5" and looks to sides without UE support with supervision. Baseline: SEE OBJECTIVE DATA Goal status: MET 07/07/2023  4. Patient ambulates 58' with RW & prosthesis with supervision. Baseline: SEE OBJECTIVE DATA Goal status: MET 07/07/2023  5. Patient negotiates ramps & curbs with RW & prosthesis with minA. Baseline: SEE OBJECTIVE DATA Goal status: MET 06/23/2023  LONG TERM GOALS: Target date: 08/28/2023  Patient demonstrates & verbalized understanding of prosthetic care to enable  safe utilization of prosthesis. Baseline: SEE OBJECTIVE DATA Goal status:   Ongoing 06/30/2023  Patient tolerates prosthesis wear >90% of awake hours without skin or limb pain issues. Baseline: SEE OBJECTIVE DATA Goal status: Ongoing 06/30/2023  Berg Balance >36/56 to indicate lower fall risk Baseline: SEE OBJECTIVE DATA Goal status: Ongoing 06/30/2023  Patient ambulates >300' with prosthesis & LRAD independently Baseline: SEE OBJECTIVE DATA Goal status: Ongoing 06/30/2023  Patient negotiates ramps, curbs & stairs with single rail with prosthesis & LRAD independently. Baseline: SEE OBJECTIVE DATA Goal status: Ongoing 06/30/2023   PLAN:  PT FREQUENCY: 2x/week  PT DURATION: 12 weeks  PLANNED INTERVENTIONS: 97164- PT Re-evaluation, 97110-Therapeutic exercises, 97530- Therapeutic activity, [id number]- Neuromuscular re-education, [phone]- Self Care, 81191- Gait training, [phone]- Prosthetic training, Patient/Family education, Balance training, Stair training, Scar mobilization, Vestibular training, DME instructions, and Physical Performance Testing  PLAN FOR NEXT SESSION:  Do 10th visit progress note.  Continue sink ambulation with cane, continue to progress ambulation tolerance as patient is fit, reaching/picking up objects from floor.     Date of referral: 04/17/2023  PT eval delayed by COPD exacerbation Referring provider: Aldean Baker, MD Referring diagnosis? Z89.511 (ICD-10-CM) - S/P BKA (below knee amputation), right Treatment diagnosis? (if different than referring diagnosis)   Other abnormalities of gait & mobility R26.89 Unsteadiness on feet R26.81 Pain in left leg M79.605 Pain in right leg M79.604 Muscle Weakness (generalized) M62.81 History of falling Z91.81 Impaired functional mobility, balance, gait and endurance  What was this (referring dx) caused by? Surgery (Type: right BKA)  Nature of Condition: Initial Onset (within last 3 months)  prosthesis delivery  04/01/2023   Laterality: Rt  Current Functional Measure Score: Other Berg Balance 19/56  Objective measurements identify impairments when they are compared to normal values, the uninvolved extremity, and prior level of function.  [x]  Yes  []  No  Objective assessment of functional ability: Severe functional limitations   Briefly describe symptoms: dependent in care & use of prosthesis, impaired balance and gait, pain in both legs  How did symptoms start: amputation from wounds  Average pain intensity:  Last 24 hours: 8/10  Past week: 8/10  How often does the pt experience symptoms? Constantly  How much have the symptoms interfered with usual daily activities? Extremely  How has condition changed since care began at this facility? NA - initial visit  In general, how is the patients overall health? Good   BACK PAIN (STarT Back Screening Tool) No   Kerly Rigsbee, Sunil Hue, Student-PT 07/21/2023, 12:29 PM  This entire session of physical therapy was performed under the direct supervision of PT signing evaluation /treatment. PT reviewed note and agrees.   Vladimir Faster, PT, DPT 07/22/2023, 7:58 AM

## 2023-07-23 ENCOUNTER — Encounter: Payer: Self-pay | Admitting: Physical Therapy

## 2023-07-23 ENCOUNTER — Ambulatory Visit: Payer: Medicare Other | Admitting: Physical Therapy

## 2023-07-23 DIAGNOSIS — R2689 Other abnormalities of gait and mobility: Secondary | ICD-10-CM

## 2023-07-23 DIAGNOSIS — M79605 Pain in left leg: Secondary | ICD-10-CM

## 2023-07-23 DIAGNOSIS — M79604 Pain in right leg: Secondary | ICD-10-CM

## 2023-07-23 DIAGNOSIS — M6281 Muscle weakness (generalized): Secondary | ICD-10-CM

## 2023-07-23 DIAGNOSIS — R2681 Unsteadiness on feet: Secondary | ICD-10-CM | POA: Diagnosis not present

## 2023-07-23 DIAGNOSIS — Z9181 History of falling: Secondary | ICD-10-CM

## 2023-07-23 DIAGNOSIS — Z7409 Other reduced mobility: Secondary | ICD-10-CM

## 2023-07-23 NOTE — Therapy (Signed)
 OUTPATIENT PHYSICAL THERAPY PROSTHETIC TREATMENT & PROGRESS NOTE   Patient Name: [name] MRN: [id number] DOB:1950-10-24, 73 y.o., male Today's Date: 07/23/2023   Progress Note Reporting Period 06/03/2023 to 07/23/2023  See note below for Objective Data and Assessment of Progress/Goals.   END OF SESSION:  PT End of Session - 07/23/23 1153     Visit Number 10    Number of Visits 25    Date for PT Re-Evaluation 08/28/23    Authorization Type [employer] Medicare    Authorization Time Period [employer] MEDICARE $20 COPAY 16 PT VISITS APPROVED 2/4-5/27/25    Authorization - Visit Number 10    Authorization - Number of Visits 16    Progress Note Due on Visit 10    PT Start Time 1145    PT Stop Time 1231    PT Time Calculation (min) 46 min    Equipment Utilized During Treatment Gait belt    Activity Tolerance Patient tolerated treatment well;Patient limited by pain;Patient limited by fatigue    Behavior During Therapy [facility] for tasks assessed/performed                   Past Medical History:  Diagnosis Date   Aortic stenosis, mild    Arthritis of hand    "just a little bit in both hands" (03/31/2012)   Asthma    "little bit" (03/31/2012)   Atrial fibrillation (HCC)    dx '04; DCCV '04, placed on flecainide, failed DCCV 04/2010, flecainide stopped; s/p successful A.fib ablation 01/31/12   CHF (congestive heart failure) (HCC)    Controlled type 2 diabetes mellitus without complication, without long-term current use of insulin (HCC) 12/13/2019   COPD (chronic obstructive pulmonary disease) (HCC)    DJD (degenerative joint disease)    Fatty liver    Fibromyalgia    GERD (gastroesophageal reflux disease)    Hyperlipidemia    Hypertension    Hypothyroidism    S/P radiation   Left atrial enlargement    LA size 45mm by echo 11/21/11   Mitral regurgitation    trivial   Obstructive sleep apnea    mild by sleep study 2013; pt stated he does not have a machine because "it wasn't bad  enough for him to have one"   Pacemaker 03/31/2012   Pneumonia    last time 2023   Restless leg syndrome    Second degree AV block    Splenomegaly    Past Surgical History:  Procedure Laterality Date   AMPUTATION Right 08/23/2022   Procedure: RIGHT FOOT FIFTH RAY AMPUTATION;  Surgeon: [name], MD;  Location: [facility] OR;  Service: Orthopedics;  Laterality: Right;   AMPUTATION Right 09/04/2022   Procedure: RIGHT BELOW KNEE AMPUTATION;  Surgeon: [name], MD;  Location: [facility] OR;  Service: Orthopedics;  Laterality: Right;   AORTIC VALVE REPLACEMENT N/A 08/22/2015   Procedure: AORTIC VALVE REPLACEMENT (AVR);  Surgeon: Kerin Perna, MD;  Location: [facility] OR;  Service: Open Heart Surgery;  Laterality: N/A;   ATRIAL FIBRILLATION ABLATION  01/30/2012   PVI by Dr. Johney Frame   ATRIAL FIBRILLATION ABLATION N/A 01/31/2012   Procedure: ATRIAL FIBRILLATION ABLATION;  Surgeon: [name], MD;  Location: [facility] CATH LAB;  Service: Cardiovascular;  Laterality: N/A;   ATRIAL FIBRILLATION ABLATION N/A 04/19/2020   Procedure: ATRIAL FIBRILLATION ABLATION;  Surgeon: [name], MD;  Location: [facility] INVASIVE CV LAB;  Service: Cardiovascular;  Laterality: N/A;   BACK SURGERY  X 3   BIOPSY  01/01/2022   Procedure: BIOPSY;  Surgeon: Kathi Der, MD;  Location: [facility] ENDOSCOPY;  Service: Gastroenterology;;   CARDIAC CATHETERIZATION N/A 08/09/2015   Procedure: Right/Left Heart Cath and Coronary Angiography;  Surgeon: Peter M Swaziland, MD;  Location: [facility] INVASIVE CV LAB;  Service: Cardiovascular;  Laterality: N/A;   CARDIAC CATHETERIZATION N/A 02/05/2016   Procedure: Right/Left Heart Cath and Coronary Angiography;  Surgeon: Corky Crafts, MD;  Location: [facility] INVASIVE CV LAB;  Service: Cardiovascular;  Laterality: N/A;   CARDIAC CATHETERIZATION N/A 04/17/2016   Procedure: Right Heart Cath;  Surgeon: Dolores Patty, MD;  Location: [facility] INVASIVE CV LAB;  Service: Cardiovascular;  Laterality: N/A;    CARDIOVASCULAR STRESS TEST  03/12/2002   EF 48%, NO EVIDENCE OF ISCHEMIA   CARDIOVERSION  01/2012; 03/31/2012   CARDIOVERSION N/A 02/25/2012   Procedure: CARDIOVERSION;  Surgeon: [name], MD;  Location: [facility] CATH LAB;  Service: Cardiovascular;  Laterality: N/A;   CARDIOVERSION N/A 03/31/2012   Procedure: CARDIOVERSION;  Surgeon: [name], MD;  Location: [facility] CATH LAB;  Service: Cardiovascular;  Laterality: N/A;   CARDIOVERSION N/A 11/23/2015   Procedure: CARDIOVERSION;  Surgeon: Laurey Morale, MD;  Location: [facility] ENDOSCOPY;  Service: Cardiovascular;  Laterality: N/A;   CARDIOVERSION N/A 04/08/2016   Procedure: CARDIOVERSION;  Surgeon: Dolores Patty, MD;  Location: [facility] ENDOSCOPY;  Service: Cardiovascular;  Laterality: N/A;   CARDIOVERSION N/A 09/14/2018   Procedure: CARDIOVERSION;  Surgeon: Wendall Stade, MD;  Location: [facility] ENDOSCOPY;  Service: Cardiovascular;  Laterality: N/A;   CARDIOVERSION N/A 10/19/2019   Procedure: CARDIOVERSION;  Surgeon: Dolores Patty, MD;  Location: [facility] ENDOSCOPY;  Service: Cardiovascular;  Laterality: N/A;   CARDIOVERSION N/A 02/25/2020   Procedure: CARDIOVERSION;  Surgeon: Pricilla Riffle, MD;  Location: [facility] ENDOSCOPY;  Service: Cardiovascular;  Laterality: N/A;   CARDIOVERSION N/A 05/25/2020   Procedure: CARDIOVERSION;  Surgeon: Lewayne Bunting, MD;  Location: [facility] ENDOSCOPY;  Service: Cardiovascular;  Laterality: N/A;   CARDIOVERSION N/A 07/10/2020   Procedure: CARDIOVERSION;  Surgeon: Jake Bathe, MD;  Location: [facility] ENDOSCOPY;  Service: Cardiovascular;  Laterality: N/A;   CLIPPING OF ATRIAL APPENDAGE N/A 08/22/2015   Procedure: CLIPPING OF ATRIAL APPENDAGE;  Surgeon: Kerin Perna, MD;  Location: [facility] OR;  Service: Open Heart Surgery;  Laterality: N/A;   COLONOSCOPY WITH PROPOFOL N/A 01/01/2022   Procedure: COLONOSCOPY WITH PROPOFOL;  Surgeon: Kathi Der, MD;  Location: [facility] ENDOSCOPY;  Service: Gastroenterology;  Laterality: N/A;   DOPPLER  ECHOCARDIOGRAPHY  03/11/2002   EF 70-75%   FINGER SURGERY Left    Middle finger   FINGER TENDON REPAIR  1980's   "right little finger" (03/31/2012)   FLEXIBLE SIGMOIDOSCOPY N/A 12/30/2021   Procedure: FLEXIBLE SIGMOIDOSCOPY;  Surgeon: Charlott Rakes, MD;  Location: [facility] ENDOSCOPY;  Service: Gastroenterology;  Laterality: N/A;   INSERT / REPLACE / REMOVE PACEMAKER     St Jude   IR RADIOLOGIST EVAL & MGMT  05/06/2019   IR RADIOLOGIST EVAL & MGMT  10/06/2019   IR RADIOLOGIST EVAL & MGMT  10/31/2020   IR RADIOLOGIST EVAL & MGMT  01/18/2021   IR RADIOLOGIST EVAL & MGMT  03/08/2021   IR RADIOLOGIST EVAL & MGMT  06/13/2021   KIDNEY SURGERY Left    LEFT AND RIGHT HEART CATHETERIZATION WITH CORONARY ANGIOGRAM N/A 10/27/2012   Procedure: LEFT AND RIGHT HEART CATHETERIZATION WITH CORONARY ANGIOGRAM;  Surgeon: Pamella Pert, MD;  Location: [facility] CATH LAB;  Service: Cardiovascular;  Laterality:  N/A;   LUMBAR DISC SURGERY  1980's X2;  2000's   MAZE N/A 08/22/2015   Procedure: MAZE;  Surgeon: Kerin Perna, MD;  Location: [facility] OR;  Service: Open Heart Surgery;  Laterality: N/A;   PACEMAKER INSERTION  03/31/2012   STJ Accent DR pacemaker implanted by Dr Johney Frame   PERMANENT PACEMAKER INSERTION N/A 03/31/2012   Procedure: PERMANENT PACEMAKER INSERTION;  Surgeon: [name], MD;  Location: [facility] CATH LAB;  Service: Cardiovascular;  Laterality: N/A;   PPM GENERATOR CHANGEOUT N/A 08/08/2020   Procedure: PPM GENERATOR CHANGEOUT;  Surgeon: [name], MD;  Location: [facility] INVASIVE CV LAB;  Service: Cardiovascular;  Laterality: N/A;   RADIOLOGY WITH ANESTHESIA Left 02/14/2021   Procedure: RADIOLOGY WITH ANESTHESIA LEFT RENAL CRYOABLATION;  Surgeon: Irish Lack, MD;  Location: [facility] ORS;  Service: Radiology;  Laterality: Left;   STUMP REVISION Right 10/09/2022   Procedure: REVISION RIGHT BELOW KNEE AMPUTATION;  Surgeon: [name], MD;  Location: [facility] OR;  Service: Orthopedics;  Laterality: Right;   STUMP  REVISION Right 11/16/2022   Procedure: REVISION RIGHT BELOW KNEE AMPUTATION;  Surgeon: [name], MD;  Location: [facility] OR;  Service: Orthopedics;  Laterality: Right;   TEE WITHOUT CARDIOVERSION  01/30/2012   Procedure: TRANSESOPHAGEAL ECHOCARDIOGRAM (TEE);  Surgeon: Laurey Morale, MD;  Location: [facility] ENDOSCOPY;  Service: Cardiovascular;  Laterality: N/A;  ablation next day   TEE WITHOUT CARDIOVERSION N/A 08/22/2015   Procedure: TRANSESOPHAGEAL ECHOCARDIOGRAM (TEE);  Surgeon: Kerin Perna, MD;  Location: [facility] OR;  Service: Open Heart Surgery;  Laterality: N/A;   TEE WITHOUT CARDIOVERSION N/A 10/19/2019   Procedure: TRANSESOPHAGEAL ECHOCARDIOGRAM (TEE);  Surgeon: Dolores Patty, MD;  Location: [facility] ENDOSCOPY;  Service: Cardiovascular;  Laterality: N/A;   US ECHOCARDIOGRAPHY  08/07/2009   EF 55-60%   Patient Active Problem List   Diagnosis Date Noted   AKI (acute kidney injury) (HCC) 12/22/2022   Fall 11/14/2022   Pain of right lower extremity due to injury 11/14/2022   Diabetic ulcer of lower leg (HCC) 11/13/2022   Osteomyelitis (HCC) 11/13/2022   Dyslipidemia 10/18/2022   Cellulitis of right lower extremity 10/17/2022   Dehiscence of amputation stump of right lower extremity (HCC) 10/09/2022   Below-knee amputation of right lower extremity (HCC) 10/09/2022   Gangrene of right foot (HCC) 09/04/2022   History of below-knee amputation of right lower extremity (HCC) 09/04/2022   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) 08/23/2022   CAP (community acquired pneumonia) 12/28/2021   Constipation 12/28/2021   Pain in both lower extremities 07/03/2021   Peripheral neuropathy 07/03/2021   Acute metabolic encephalopathy 06/09/2021   Marijuana dependence (HCC) 06/09/2021   DNR (do not resuscitate) 06/09/2021   Chest pain 06/08/2021   Renal lesion 02/14/2021   Status post cryoablation 02/14/2021   Allergic rhinitis 12/08/2020   Preop pulmonary/respiratory exam 03/27/2020   Acute on  chronic diastolic (congestive) heart failure (HCC) 01/22/2020   Abdominal pain 01/22/2020   Hypothyroidism 12/13/2019   DM2 (diabetes mellitus, type 2) (HCC) 12/13/2019   Chronic kidney disease, stage 3b (HCC) 12/13/2019   Acute bacterial bronchitis 12/13/2019   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   Typical atrial flutter (HCC)    Chronic respiratory failure with hypoxia (HCC) 05/23/2016   CAD (coronary artery disease) 05/23/2016   Persistent atrial fibrillation (HCC)    Atypical atrial flutter (HCC)    Visit for monitoring Tikosyn therapy 04/16/2016   Chronic diastolic CHF (congestive heart failure) (HCC) 02/03/2016  Cough 12/06/2015   Acute on chronic respiratory failure with hypoxia (HCC) 09/08/2015   Hallucinations 09/04/2015   Lymphadenopathy 09/04/2015   Ascending aortic aneurysm (HCC) 09/04/2015   Hypoxia 09/02/2015   S/P aortic valve replacement with bioprosthetic valve 09/02/2015   Pleural effusion 09/02/2015   S/P AVR 08/22/2015   GERD without esophagitis 09/20/2014   Intrinsic asthma 10/13/2013   Dyspnea 09/15/2013   Pulmonary hypertension (HCC) 09/15/2013   Shortness of breath 05/05/2013   Pacemaker-St.Jude 04/01/2012   Bradycardia 03/31/2012   Second degree AV block 03/31/2012   AV block, 1st degree 02/01/2012   PAF (paroxysmal atrial fibrillation) (HCC) 11/13/2011   Fatigue 11/13/2011   Essential hypertension 11/13/2011   Aortic stenosis 11/13/2011   Sleep apnea 11/13/2011    PCP: Alysia Penna, MD  REFERRING PROVIDER: Aldean Baker, MD  ONSET DATE: 04/01/2023 prosthesis delivery  REFERRING DIAG: Z89.511 (ICD-10-CM) - S/P BKA (below knee amputation), right   THERAPY DIAG:  Other abnormalities of gait and mobility  Unsteadiness on feet  Pain in left leg  Pain in right leg  Muscle weakness (generalized)  Impaired functional mobility, balance, gait, and endurance  History of falling  Rationale for Evaluation and Treatment:  Rehabilitation  SUBJECTIVE:   SUBJECTIVE STATEMENT: He was able to walk done to pond with rollator walker.  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 His leg pain is now really high.  Dr. Lajoyce Corners feels that is coming from his back and neuropathy.   PERTINENT HISTORY:  right TTA 09/04/22, DM2, HLD, asthma, COPD, A-Fib, CAD, pacemaker, cardioversion & catheterization, arthritis, Fibromyalgia, GERD, HTN,   PAIN:  Are you having pain? Yes: NPRS scale:  today 10/10 Right leg, 10/10 left leg  and in last week down 8/10 both legs Pain location: both legs Pain description: neuropathy, sharp, sticking "big" needles in leg Aggravating factors: meds wearing off,  standing & walking Relieving factors: pain pill 2x/day  Residual limb prosthesis on limb 0/10 - 10/10,  stand or walk 5/10 - 10/10  PRECAUTIONS: Fall and ICD/Pacemaker  WEIGHT BEARING RESTRICTIONS: No  FALLS: Has patient fallen in last 6 months? Yes. Number of falls 1 no injuries  LIVING ENVIRONMENT: Lives with: lives with their spouse and 3 dogs (mini schnauzer 15# indoor & 2 german [place] outdoor)  Lives in: [place] Home Access: Ramped entrance Home layout: One level Stairs: Yes: External: 6 steps; can reach both ramp at primary entrance Has following equipment at home: Single point cane, Environmental consultant - 2 wheeled, Wheelchair (manual), shower chair, Tour manager, and Ramped entry  OCCUPATION: retired from [employer]  PLOF: Independent, Independent with household mobility without device, and Independent with community mobility without device used oxygen as needed  PATIENT GOALS:  use prosthesis to function in community & in house, do odd repairs, yard work  OBJECTIVE:  COGNITION: Overall cognitive status: Within functional limits for tasks assessed   SENSATION: WFL  CARDIOVASCULAR RESPONSE: Functional activity: balance and gait assessments Pre-activity vitals: no oxygen seated in w/c HR: 67 SpO2: 93 Post-activity vitals: Berg with no  oxygen  HR: 78 SpO2: 90-91%  94% on oxygen at rest, HR 70 After gait with O2 SpO2 94%, HR 73  Modified Borg scale for dyspnea: 2: mild shortness of breath  POSTURE: rounded shoulders, forward head, flexed trunk , and weight shift left  LOWER EXTREMITY ROM:  ROM P:passive  A:active Right eval  Hip flexion   Hip extension standing -10*   Hip abduction   Hip adduction   Hip  internal rotation   Hip external rotation   Knee flexion   Knee extension   Ankle dorsiflexion   Ankle plantarflexion   Ankle inversion   Ankle eversion    (Blank rows = not tested)  LOWER EXTREMITY MMT:  MMT Right eval Left eval  Hip flexion    Hip extension 3-4/5 3-4/5  Hip abduction 3-4/5 4/5  Hip adduction    Hip internal rotation    Hip external rotation    Knee flexion 3-4/5 4/5  Knee extension 3-4/5 4/5  Ankle dorsiflexion    Ankle plantarflexion    Ankle inversion    Ankle eversion    At Evaluation all strength testing is grossly seated and functionally standing / gait. (Blank rows = not tested)  TRANSFERS: Evaluation on 06/03/2023:    Sit to stand: SBA from 20" w/c using BUEs on armrests to arise stabilizes with back of legs against w/c Stand to sit: SBA uses BUEs on armrests of w/c to control descent  FUNCTIONAL TESTs:  Evaluation on 06/03/2023:   Berg Balance Scale: 19/56  GAIT: Evaluation on 06/03/2023:    Gait pattern: step to pattern, decreased step length- Left, decreased stance time- Right, decreased hip/knee flexion- Right, decreased ankle dorsiflexion- Right, Right hip hike, knee flexed in stance- Right, lateral hip instability, trunk flexed, and abducted- Right Distance walked: 25' Assistive device utilized: Environmental consultant - 2 wheeled and TTA prosthesis Level of assistance: Min A balance loss to left on straight path & 180* turn Comments: used O2 with portable unit slung over left shoulder,  pain in residual limb limited distance,   CURRENT PROSTHETIC WEAR  ASSESSMENT: Evaluation on 06/03/2023:    Patient is dependent with: skin check, residual limb care, care of non-amputated limb, prosthetic cleaning, ply sock cleaning, correct ply sock adjustment, proper wear schedule/adjustment, and proper weight-bearing schedule/adjustment Donning prosthesis: dependent with PT 100% cues on technique Doffing prosthesis: reports modified independent Prosthetic wear tolerance: 2-4 hours, 1x/day, ~3 days/week Prosthetic weight bearing tolerance: 5 minutes with partial weight on prosthesis with pain increasing to 8/10 (intolerable) Edema: pitting with 7 sec capillary refill Residual limb condition: dry skin especially distal limb, 3 scratches with superficial scab on distal limb, scar healed but adhered over entire area of distal tibia, bulbous shape,  Prosthetic description: silicon liner with pin lock suspension, total contact socket with flexible inner socket, dynamic response foot K code/activity level with prosthetic use: Level 3    TODAY'S TREATMENT:                                                                                                                             DATE:  07/23/2023: Prosthetic Care with TTA prosthesis: PT instructed with demo & verbal cues on use of cut off socks to increase fit distally where limb shrinks more than knee area. He arrived with 9 ply full length socks and PT switched to 10-ply (5 full & 5 cutoff). Pt reports that his  limb felt better. Pt and wife verbalize understanding.  PT instructed in use of top portion of cutoff socks under liner along distal thigh as wick for sweat which should help the skin irritation there.  Pt & wife verbalized understanding.   Self-Care: PT instructed pt & wife in rating pain with idea that 10/10 is max and nothing could make the pain increase.  With mobility the goal for pain management is limit heavy spikes that would limit function later in the day or the next day.  If walking with rollator  walker and pain increases more than 2 increments on 0-10 scale, then he should sit to rest until pain decreases close to baseline or starting level.  Also awareness that less UE support like cane uses only 1 hand support and no device would have no UE assist to unload weight or intensity of LE muscle activity. PT also educated on his pain meds that Gabapentin should be taken regularly / time wise as directed by MD and Oxy more when pain starts to go up / not wait until it peaks (but awareness of time so not taken 2nd dose within 8 hours as directed by MD).  Pt and wife verbalized understanding.     TREATMENT:                                                                                                                             DATE:  07/21/2023: Prosthetic Care with TTA prosthesis: -Ambulation with cane at sink fwd and bwd walking MinA for wt shift at hip, vc for UE support and sequencing. Patient requiring 2 long seated rest breaks between bouts. Demonstrated good and safe use of rollator.  -PT demo & verbal cues on technique: Ambulation with cane at sink side stepping CGA with patient using RUE to hover over sink. Required seated rest break between bouts.    -Educated on short/medium/long walk for activity tolerance, handout provided with patient and wife verbalizing understanding.  -throughout session patient performed STS from rollator demonstrating good safety and balance when standing/sitting/moving rollator into position.    TREATMENT:                                                                                                                             DATE:  07/16/2023: Prosthetic Care with TTA prosthesis: Patient educated on driving options with demo and handout. Patient and wife verbalize understanding Pt  amb with his rollator walker Supervision 22ft before seated rest with 95% SpO2 and 60HR after rest. Continuing 183' for total of 459ft.  Sit to/from stand from rollator seat  safely. After ambulation 95%SpO2 79HR  PT educated on use of supplemental oxygen that he reports MD ordered prn and would be ease to transport with rollator walker. Pt and wife verbalized understanding.  Cane amb at sink fwd/bwd CGA with cuing for proper sequencing and placement of LE with close supervision.    TREATMENT:                                                                                                                             DATE:  07/14/2023: Prosthetic Care with TTA prosthesis: Pt amb with his rollator walker >200' and negotiated ramp & curb safely. Sit to/from stand from rollator seat safely.   PT instructed pt & wife in adjusting ply socks with verbal & tactile (too few, too many and correct ply) cues.  Pt and wife verbalized understanding.  PT recommended increasing wear to all awake hours except 2 hours midday.  Pt verbalized understanding.  PT demo & verbal cues loading & unloading rollator in/out of car.  Pt & wife verbalized understanding.   Pt able to ambulate around car using BUEs on car safely.       PATIENT EDUCATION: PATIENT EDUCATED ON FOLLOWING PROSTHETIC CARE: Education details:  Skin check, Residual limb care, Prosthetic cleaning, Correct ply sock adjustment, Propper donning, and Proper wear schedule/adjustment Prosthetic wear tolerance: 2 hours 2x/day, 7 days/week Person educated: Patient and Spouse Education method: Explanation, Demonstration, Tactile cues, and Verbal cues Education comprehension: verbalized understanding, verbal cues required, tactile cues required, and needs further education  HOME EXERCISE PROGRAM: Printed and issued 06/25/23 Do each exercise 1-2  times per day Do each exercise 5-10 repetitions Hold each exercise for 2 seconds to feel your location  AT SINK FIND YOUR MIDLINE POSITION AND PLACE FEET EQUAL DISTANCE FROM THE MIDLINE.  Try to find this position when standing still for activities.   USE TAPE ON FLOOR TO MARK THE  MIDLINE POSITION which is even with middle of sink.  You also should try to feel with your limb pressure in socket.  You are trying to feel with limb what you used to feel with the bottom of your foot.  Side to Side Shift: Moving your hips only (not shoulders): move weight onto your left leg, HOLD/FEEL pressure in socket.  Move back to equal weight on each leg, HOLD/FEEL pressure in socket. Move weight onto your right leg, HOLD/FEEL pressure in socket. Move back to equal weight on each leg, HOLD/FEEL pressure in socket. Repeat.  Start with both hands on sink, progress to hand on prosthetic side only, then no hands.  Front to Back Shift: Moving your hips only (not shoulders): move your weight forward onto your toes, HOLD/FEEL pressure in socket. Move your weight back to equal Flat Foot  on both legs, HOLD/FEEL  pressure in socket. Move your weight back onto your heels, HOLD/FEEL  pressure in socket. Move your weight back to equal on both legs, HOLD/FEEL  pressure in socket. Repeat.  Start with both hands on sink, progress to hand on prosthetic side only, then no hands.  Moving Cones / Cups: With equal weight on each leg: Hold on with one hand the first time, then progress to no hand supports. Move cups from one side of sink to the other. Place cups ~2" out of your reach, progress to 10" beyond reach.  Place one hand in middle of sink and reach with other hand. Do both arms.  Then hover one hand and move cups with other hand.  Overhead/Upward Reaching: alternated reaching up to top cabinets or ceiling if no cabinets present. Keep equal weight on each leg. Start with one hand support on counter while other hand reaches and progress to no hand support with reaching.  ace one hand in middle of sink and reach with other hand. Do both arms.  Then hover one hand and move cups with other hand.  5.   Looking Over Shoulders: With equal weight on each leg: alternate turning to look over your shoulders with one hand  support on counter as needed.  Start with head motions only to look in front of shoulder, then even with shoulder and progress to looking behind you. To look to side, move head /eyes, then shoulder on side looking pulls back, shift more weight to side looking and pull hip back. Place one hand in middle of sink and let go with other hand so your shoulder can pull back. Switch hands to look other way.   Then hover one hand and look over shoulder. If looking right, use left hand at sink. If looking left, use right hand at sink. 6.  Stepping with leg that is not amputated:  Move items under cabinet out of your way. Shift your hips/pelvis so weight on prosthesis. Tighten muscles in hip on prosthetic side.  SLOWLY step other leg so front of foot is in cabinet. Then step back to floor.   ASSESSMENT:  CLINICAL IMPRESSION: Patient has made significant progress with his mobility in first 10 visits with PT. He has improved his understanding for care & use of prosthesis.  He is using a rollator walker for community mobility instead of w/c dependent.  He appears to understand advanced prosthetic care instructions given today.  Also he seems to understand PT instructions for mobility and pain management.  Patient continues to benefit from skilled physical therapy.    OBJECTIVE IMPAIRMENTS: Abnormal gait, cardiopulmonary status limiting activity, decreased activity tolerance, decreased balance, decreased endurance, decreased knowledge of condition, decreased knowledge of use of DME, decreased mobility, difficulty walking, decreased ROM, decreased strength, increased edema, prosthetic dependency , and pain.   ACTIVITY LIMITATIONS: carrying, lifting, bending, standing, stairs, transfers, and locomotion level  PARTICIPATION LIMITATIONS: meal prep, cleaning, driving, community activity, and yard work  PERSONAL FACTORS: Fitness, Time since onset of injury/illness/exacerbation, and 3+ comorbidities: see PMH  are also  affecting patient's functional outcome.   REHAB POTENTIAL: Good  CLINICAL DECISION MAKING: Evolving/moderate complexity  EVALUATION COMPLEXITY: Moderate   GOALS: Goals reviewed with patient? Yes  SHORT TERM GOALS: Target date: 07/02/2023  Patient donnes prosthesis modified independent & verbalizes proper cleaning. Baseline: SEE OBJECTIVE DATA Goal status:   MET 06/30/2023 2.  Patient tolerates prosthesis >6 hrs total /day without skin issues or limb  pain >5/10 after standing. Baseline: SEE OBJECTIVE DATA Goal status: Partially MET 06/30/2023  3.  Patient able to reach 5" and looks to sides without UE support with supervision. Baseline: SEE OBJECTIVE DATA Goal status: MET 07/07/2023  4. Patient ambulates 83' with RW & prosthesis with supervision. Baseline: SEE OBJECTIVE DATA Goal status: MET 07/07/2023  5. Patient negotiates ramps & curbs with RW & prosthesis with minA. Baseline: SEE OBJECTIVE DATA Goal status: MET 06/23/2023  LONG TERM GOALS: Target date: 08/28/2023  Patient demonstrates & verbalized understanding of prosthetic care to enable safe utilization of prosthesis. Baseline: SEE OBJECTIVE DATA Goal status:   Ongoing 06/30/2023  Patient tolerates prosthesis wear >90% of awake hours without skin or limb pain issues. Baseline: SEE OBJECTIVE DATA Goal status: Ongoing 06/30/2023  Berg Balance >36/56 to indicate lower fall risk Baseline: SEE OBJECTIVE DATA Goal status: Ongoing 06/30/2023  Patient ambulates >300' with prosthesis & LRAD independently Baseline: SEE OBJECTIVE DATA Goal status: Ongoing 06/30/2023  Patient negotiates ramps, curbs & stairs with single rail with prosthesis & LRAD independently. Baseline: SEE OBJECTIVE DATA Goal status: Ongoing 06/30/2023   PLAN:  PT FREQUENCY: 2x/week  PT DURATION: 12 weeks  PLANNED INTERVENTIONS: 97164- PT Re-evaluation, 97110-Therapeutic exercises, 97530- Therapeutic activity, 97112- Neuromuscular re-education, [phone]- Self  Care, 95284- Gait training, [phone]- Prosthetic training, Patient/Family education, Balance training, Stair training, Scar mobilization, Vestibular training, DME instructions, and Physical Performance Testing  PLAN FOR NEXT SESSION:  Check on pain management and use of cutoff socks.  Continue sink ambulation with cane, continue to progress ambulation tolerance as patient is fit, reaching/picking up objects from floor.     Date of referral: 04/17/2023  PT eval delayed by COPD exacerbation Referring provider: Aldean Baker, MD Referring diagnosis? Z89.511 (ICD-10-CM) - S/P BKA (below knee amputation), right Treatment diagnosis? (if different than referring diagnosis)   Other abnormalities of gait & mobility R26.89 Unsteadiness on feet R26.81 Pain in left leg M79.605 Pain in right leg M79.604 Muscle Weakness (generalized) M62.81 History of falling Z91.81 Impaired functional mobility, balance, gait and endurance  What was this (referring dx) caused by? Surgery (Type: right BKA)  Nature of Condition: Initial Onset (within last 3 months)  prosthesis delivery 04/01/2023   Laterality: Rt  Current Functional Measure Score: Other Berg Balance 19/56  Objective measurements identify impairments when they are compared to normal values, the uninvolved extremity, and prior level of function.  [x]  Yes  []  No  Objective assessment of functional ability: Severe functional limitations   Briefly describe symptoms: dependent in care & use of prosthesis, impaired balance and gait, pain in both legs  How did symptoms start: amputation from wounds  Average pain intensity:  Last 24 hours: 8/10  Past week: 8/10  How often does the pt experience symptoms? Constantly  How much have the symptoms interfered with usual daily activities? Extremely  How has condition changed since care began at this facility? NA - initial visit  In general, how is the patients overall health? Good   BACK PAIN (STarT  Back Screening Tool) No   Vladimir Faster, PT, DPT 07/23/2023, 12:54 PM

## 2023-07-27 ENCOUNTER — Encounter: Payer: Self-pay | Admitting: Orthopedic Surgery

## 2023-07-27 NOTE — Progress Notes (Signed)
 Office Visit Note   Patient: [name]           Date of Birth: 10-13-1950           MRN: [id number] Visit Date: 07/21/2023              Requested by: Alysia Penna, MD [address] [place],  [place] [address] PCP: Alysia Penna, MD  Chief Complaint  Patient presents with   Left Leg - Follow-up      HPI: Patient is a 73 year old gentleman who is seen in follow-up for ulceration left lower extremity.  Patient is currently wearing a knee-high compression stocking.  He has a right transtibial amputation.  Patient states he is very pleased with the healing and decreasing ulcer size.  Patient states he does have radicular symptoms on the left lower extremity that radiates from the lateral thigh to his toes.  Patient also states he has right-sided radicular pain to the residual limb.  Assessment & Plan: Visit Diagnoses:  1. S/P BKA (below knee amputation), right (HCC)   2. Chronic venous hypertension (idiopathic) with ulcer and inflammation of left lower extremity (HCC)     Plan: Patient will continue his follow-up with the pain clinic.  Anticipate patient will proceed with a epidural steroid injection at [facility].  Follow-Up Instructions: Return in about 4 weeks (around 08/18/2023).   Ortho Exam  Patient is alert, oriented, no adenopathy, well-dressed, normal affect, normal respiratory effort. Patient is currently on Neurontin 3 times a day.  He has a flat ulcer on the left calf with healthy granulation tissue it measures 1 cm x 2 cm.  Patient has radicular burning pain into the left foot.  No focal motor weakness.  Negative sciatic tension sign.  Imaging: No results found. No images are attached to the encounter.  Labs: Lab Results  Component Value Date   HGBA1C 6.3 (H) 11/13/2022   HGBA1C 6.5 (H) 12/27/2021   HGBA1C 6.6 (H) 06/11/2021   ESRSEDRATE 60 (H) 09/04/2015   REPTSTATUS 10/22/2022 FINAL 10/17/2022   GRAMSTAIN NO WBC SEEN NO ORGANISMS SEEN   10/09/2022   CULT  10/17/2022    NO GROWTH 5 DAYS Performed at [facility], [address], [place], [place] [address]    [facility] STAPHYLOCOCCUS AUREUS 10/09/2022   LABORGA STAPHYLOCOCCUS EPIDERMIDIS 10/09/2022     Lab Results  Component Value Date   ALBUMIN 3.4 (L) 04/15/2023   ALBUMIN 3.9 02/25/2023   ALBUMIN 3.5 01/31/2023   PREALBUMIN 25 09/04/2022    Lab Results  Component Value Date   MG 2.4 12/23/2022   MG 2.0 06/11/2021   MG 2.3 12/10/2020   No results found for: "VD25OH"  Lab Results  Component Value Date   PREALBUMIN 25 09/04/2022      Latest Ref Rng & Units 04/15/2023   11:15 AM 02/25/2023   10:05 AM 01/31/2023    8:50 AM  CBC EXTENDED  WBC 4.0 - 10.5 K/uL 7.1  9.8    9.6  10.4   RBC 4.22 - 5.81 MIL/uL 4.35  4.84    4.87  4.40   Hemoglobin 13.0 - 17.0 g/dL 9.6  82.9    56.2  13.0   HCT 39.0 - 52.0 % 34.2  38.5    38.6  35.6   Platelets 150 - 400 K/uL 175  206    207  208   NEUT# 1.7 - 7.7 K/uL 5.4  7.9    Lymph# 0.7 -  4.0 K/uL 0.8  0.8       There is no height or weight on file to calculate BMI.  Orders:  No orders of the defined types were placed in this encounter.  No orders of the defined types were placed in this encounter.    Procedures: No procedures performed  Clinical Data: No additional findings.  ROS:  All other systems negative, except as noted in the HPI. Review of Systems  Objective: Vital Signs: There were no vitals taken for this visit.  Specialty Comments:  No specialty comments available.  PMFS History: Patient Active Problem List   Diagnosis Date Noted   AKI (acute kidney injury) (HCC) 12/22/2022   Fall 11/14/2022   Pain of right lower extremity due to injury 11/14/2022   Diabetic ulcer of lower leg (HCC) 11/13/2022   Osteomyelitis (HCC) 11/13/2022   Dyslipidemia 10/18/2022   Cellulitis of right lower extremity 10/17/2022   Dehiscence of amputation stump of right lower extremity (HCC) 10/09/2022    Below-knee amputation of right lower extremity (HCC) 10/09/2022   Gangrene of right foot (HCC) 09/04/2022   History of below-knee amputation of right lower extremity (HCC) 09/04/2022   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) 08/23/2022   CAP (community acquired pneumonia) 12/28/2021   Constipation 12/28/2021   Pain in both lower extremities 07/03/2021   Peripheral neuropathy 07/03/2021   Acute metabolic encephalopathy 06/09/2021   Marijuana dependence (HCC) 06/09/2021   DNR (do not resuscitate) 06/09/2021   Chest pain 06/08/2021   Renal lesion 02/14/2021   Status post cryoablation 02/14/2021   Allergic rhinitis 12/08/2020   Preop pulmonary/respiratory exam 03/27/2020   Acute on chronic diastolic (congestive) heart failure (HCC) 01/22/2020   Abdominal pain 01/22/2020   Hypothyroidism 12/13/2019   DM2 (diabetes mellitus, type 2) (HCC) 12/13/2019   Chronic kidney disease, stage 3b (HCC) 12/13/2019   Acute bacterial bronchitis 12/13/2019   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   Typical atrial flutter (HCC)    Chronic respiratory failure with hypoxia (HCC) 05/23/2016   CAD (coronary artery disease) 05/23/2016   Persistent atrial fibrillation (HCC)    Atypical atrial flutter (HCC)    Visit for monitoring Tikosyn therapy 04/16/2016   Chronic diastolic CHF (congestive heart failure) (HCC) 02/03/2016   Cough 12/06/2015   Acute on chronic respiratory failure with hypoxia (HCC) 09/08/2015   Hallucinations 09/04/2015   Lymphadenopathy 09/04/2015   Ascending aortic aneurysm (HCC) 09/04/2015   Hypoxia 09/02/2015   S/P aortic valve replacement with bioprosthetic valve 09/02/2015   Pleural effusion 09/02/2015   S/P AVR 08/22/2015   GERD without esophagitis 09/20/2014   Intrinsic asthma 10/13/2013   Dyspnea 09/15/2013   Pulmonary hypertension (HCC) 09/15/2013   Shortness of breath 05/05/2013   Pacemaker-St.Jude 04/01/2012   Bradycardia 03/31/2012   Second degree AV block 03/31/2012    AV block, 1st degree 02/01/2012   PAF (paroxysmal atrial fibrillation) (HCC) 11/13/2011   Fatigue 11/13/2011   Essential hypertension 11/13/2011   Aortic stenosis 11/13/2011   Sleep apnea 11/13/2011   Past Medical History:  Diagnosis Date   Aortic stenosis, mild    Arthritis of hand    "just a little bit in both hands" (03/31/2012)   Asthma    "little bit" (03/31/2012)   Atrial fibrillation (HCC)    dx '04; DCCV '04, placed on flecainide, failed DCCV 04/2010, flecainide stopped; s/p successful A.fib ablation 01/31/12   CHF (congestive heart failure) (HCC)    Controlled type 2 diabetes mellitus without complication,  without long-term current use of insulin (HCC) 12/13/2019   COPD (chronic obstructive pulmonary disease) (HCC)    DJD (degenerative joint disease)    Fatty liver    Fibromyalgia    GERD (gastroesophageal reflux disease)    Hyperlipidemia    Hypertension    Hypothyroidism    S/P radiation   Left atrial enlargement    LA size 45mm by echo 11/21/11   Mitral regurgitation    trivial   Obstructive sleep apnea    mild by sleep study 2013; pt stated he does not have a machine because "it wasn't bad enough for him to have one"   Pacemaker 03/31/2012   Pneumonia    last time 2023   Restless leg syndrome    Second degree AV block    Splenomegaly     Family History  Problem Relation Age of Onset   Heart failure Mother    Stroke Father    Neuropathy Neg Hx     Past Surgical History:  Procedure Laterality Date   AMPUTATION Right 08/23/2022   Procedure: RIGHT FOOT FIFTH RAY AMPUTATION;  Surgeon: [name], MD;  Location: [facility] OR;  Service: Orthopedics;  Laterality: Right;   AMPUTATION Right 09/04/2022   Procedure: RIGHT BELOW KNEE AMPUTATION;  Surgeon: [name], MD;  Location: [facility] OR;  Service: Orthopedics;  Laterality: Right;   AORTIC VALVE REPLACEMENT N/A 08/22/2015   Procedure: AORTIC VALVE REPLACEMENT (AVR);  Surgeon: Kerin Perna, MD;  Location: [facility] OR;   Service: Open Heart Surgery;  Laterality: N/A;   ATRIAL FIBRILLATION ABLATION  01/30/2012   PVI by Dr. Johney Frame   ATRIAL FIBRILLATION ABLATION N/A 01/31/2012   Procedure: ATRIAL FIBRILLATION ABLATION;  Surgeon: [name], MD;  Location: [facility] CATH LAB;  Service: Cardiovascular;  Laterality: N/A;   ATRIAL FIBRILLATION ABLATION N/A 04/19/2020   Procedure: ATRIAL FIBRILLATION ABLATION;  Surgeon: [name], MD;  Location: [facility] INVASIVE CV LAB;  Service: Cardiovascular;  Laterality: N/A;   BACK SURGERY     X 3   BIOPSY  01/01/2022   Procedure: BIOPSY;  Surgeon: Kathi Der, MD;  Location: [facility] ENDOSCOPY;  Service: Gastroenterology;;   CARDIAC CATHETERIZATION N/A 08/09/2015   Procedure: Right/Left Heart Cath and Coronary Angiography;  Surgeon: Peter M Swaziland, MD;  Location: [facility] INVASIVE CV LAB;  Service: Cardiovascular;  Laterality: N/A;   CARDIAC CATHETERIZATION N/A 02/05/2016   Procedure: Right/Left Heart Cath and Coronary Angiography;  Surgeon: Corky Crafts, MD;  Location: [facility] INVASIVE CV LAB;  Service: Cardiovascular;  Laterality: N/A;   CARDIAC CATHETERIZATION N/A 04/17/2016   Procedure: Right Heart Cath;  Surgeon: Dolores Patty, MD;  Location: [facility] INVASIVE CV LAB;  Service: Cardiovascular;  Laterality: N/A;   CARDIOVASCULAR STRESS TEST  03/12/2002   EF 48%, NO EVIDENCE OF ISCHEMIA   CARDIOVERSION  01/2012; 03/31/2012   CARDIOVERSION N/A 02/25/2012   Procedure: CARDIOVERSION;  Surgeon: [name], MD;  Location: [facility] CATH LAB;  Service: Cardiovascular;  Laterality: N/A;   CARDIOVERSION N/A 03/31/2012   Procedure: CARDIOVERSION;  Surgeon: [name], MD;  Location: [facility] CATH LAB;  Service: Cardiovascular;  Laterality: N/A;   CARDIOVERSION N/A 11/23/2015   Procedure: CARDIOVERSION;  Surgeon: Laurey Morale, MD;  Location: [facility] ENDOSCOPY;  Service: Cardiovascular;  Laterality: N/A;   CARDIOVERSION N/A 04/08/2016   Procedure: CARDIOVERSION;  Surgeon: Dolores Patty, MD;  Location:  [facility] ENDOSCOPY;  Service: Cardiovascular;  Laterality: N/A;   CARDIOVERSION N/A 09/14/2018   Procedure: CARDIOVERSION;  Surgeon: Eden Emms,  Noralyn Pick, MD;  Location: [facility] ENDOSCOPY;  Service: Cardiovascular;  Laterality: N/A;   CARDIOVERSION N/A 10/19/2019   Procedure: CARDIOVERSION;  Surgeon: Dolores Patty, MD;  Location: [facility] ENDOSCOPY;  Service: Cardiovascular;  Laterality: N/A;   CARDIOVERSION N/A 02/25/2020   Procedure: CARDIOVERSION;  Surgeon: Pricilla Riffle, MD;  Location: [facility] ENDOSCOPY;  Service: Cardiovascular;  Laterality: N/A;   CARDIOVERSION N/A 05/25/2020   Procedure: CARDIOVERSION;  Surgeon: Lewayne Bunting, MD;  Location: [facility] ENDOSCOPY;  Service: Cardiovascular;  Laterality: N/A;   CARDIOVERSION N/A 07/10/2020   Procedure: CARDIOVERSION;  Surgeon: Jake Bathe, MD;  Location: [facility] ENDOSCOPY;  Service: Cardiovascular;  Laterality: N/A;   CLIPPING OF ATRIAL APPENDAGE N/A 08/22/2015   Procedure: CLIPPING OF ATRIAL APPENDAGE;  Surgeon: Kerin Perna, MD;  Location: [facility] OR;  Service: Open Heart Surgery;  Laterality: N/A;   COLONOSCOPY WITH PROPOFOL N/A 01/01/2022   Procedure: COLONOSCOPY WITH PROPOFOL;  Surgeon: Kathi Der, MD;  Location: [facility] ENDOSCOPY;  Service: Gastroenterology;  Laterality: N/A;   DOPPLER ECHOCARDIOGRAPHY  03/11/2002   EF 70-75%   FINGER SURGERY Left    Middle finger   FINGER TENDON REPAIR  1980's   "right little finger" (03/31/2012)   FLEXIBLE SIGMOIDOSCOPY N/A 12/30/2021   Procedure: FLEXIBLE SIGMOIDOSCOPY;  Surgeon: Charlott Rakes, MD;  Location: [facility] ENDOSCOPY;  Service: Gastroenterology;  Laterality: N/A;   INSERT / REPLACE / REMOVE PACEMAKER     St Jude   IR RADIOLOGIST EVAL & MGMT  05/06/2019   IR RADIOLOGIST EVAL & MGMT  10/06/2019   IR RADIOLOGIST EVAL & MGMT  10/31/2020   IR RADIOLOGIST EVAL & MGMT  01/18/2021   IR RADIOLOGIST EVAL & MGMT  03/08/2021   IR RADIOLOGIST EVAL & MGMT  06/13/2021   KIDNEY SURGERY Left    LEFT AND RIGHT HEART  CATHETERIZATION WITH CORONARY ANGIOGRAM N/A 10/27/2012   Procedure: LEFT AND RIGHT HEART CATHETERIZATION WITH CORONARY ANGIOGRAM;  Surgeon: Pamella Pert, MD;  Location: [facility] CATH LAB;  Service: Cardiovascular;  Laterality: N/A;   LUMBAR DISC SURGERY  1980's X2;  2000's   MAZE N/A 08/22/2015   Procedure: MAZE;  Surgeon: Kerin Perna, MD;  Location: [facility] OR;  Service: Open Heart Surgery;  Laterality: N/A;   PACEMAKER INSERTION  03/31/2012   STJ Accent DR pacemaker implanted by Dr Johney Frame   PERMANENT PACEMAKER INSERTION N/A 03/31/2012   Procedure: PERMANENT PACEMAKER INSERTION;  Surgeon: [name], MD;  Location: [facility] CATH LAB;  Service: Cardiovascular;  Laterality: N/A;   PPM GENERATOR CHANGEOUT N/A 08/08/2020   Procedure: PPM GENERATOR CHANGEOUT;  Surgeon: [name], MD;  Location: [facility] INVASIVE CV LAB;  Service: Cardiovascular;  Laterality: N/A;   RADIOLOGY WITH ANESTHESIA Left 02/14/2021   Procedure: RADIOLOGY WITH ANESTHESIA LEFT RENAL CRYOABLATION;  Surgeon: Irish Lack, MD;  Location: [facility] ORS;  Service: Radiology;  Laterality: Left;   STUMP REVISION Right 10/09/2022   Procedure: REVISION RIGHT BELOW KNEE AMPUTATION;  Surgeon: [name], MD;  Location: [facility] OR;  Service: Orthopedics;  Laterality: Right;   STUMP REVISION Right 11/16/2022   Procedure: REVISION RIGHT BELOW KNEE AMPUTATION;  Surgeon: [name], MD;  Location: [facility] OR;  Service: Orthopedics;  Laterality: Right;   TEE WITHOUT CARDIOVERSION  01/30/2012   Procedure: TRANSESOPHAGEAL ECHOCARDIOGRAM (TEE);  Surgeon: Laurey Morale, MD;  Location: [facility] ENDOSCOPY;  Service: Cardiovascular;  Laterality: N/A;  ablation next day   TEE WITHOUT CARDIOVERSION N/A 08/22/2015   Procedure: TRANSESOPHAGEAL ECHOCARDIOGRAM (TEE);  Surgeon: Theron Arista  Donata Clay, MD;  Location: [facility] OR;  Service: Open Heart Surgery;  Laterality: N/A;   TEE WITHOUT CARDIOVERSION N/A 10/19/2019   Procedure: TRANSESOPHAGEAL ECHOCARDIOGRAM (TEE);  Surgeon: Dolores Patty, MD;  Location: [facility] ENDOSCOPY;  Service: Cardiovascular;  Laterality: N/A;   US ECHOCARDIOGRAPHY  08/07/2009   EF 55-60%   Social History   Occupational History   Occupation: retired  Tobacco Use   Smoking status: Former    Current packs/day: 0.00    Average packs/day: 1 pack/day for 30.2 years (30.2 ttl pk-yrs)    Types: Cigarettes    Start date: 1963    Quit date: 07/18/1991    Years since quitting: 32.0    Passive exposure: Never   Smokeless tobacco: Never  Vaping Use   Vaping status: Never Used  Substance and Sexual Activity   Alcohol use: Not Currently    Comment: occasional   Drug use: Yes    Types: Marijuana    Comment: Marijuana- for nausea   Sexual activity: Not Currently

## 2023-07-28 ENCOUNTER — Encounter: Payer: Self-pay | Admitting: Physical Therapy

## 2023-07-28 ENCOUNTER — Ambulatory Visit: Admitting: Physical Therapy

## 2023-07-28 DIAGNOSIS — M79604 Pain in right leg: Secondary | ICD-10-CM

## 2023-07-28 DIAGNOSIS — R2689 Other abnormalities of gait and mobility: Secondary | ICD-10-CM | POA: Diagnosis not present

## 2023-07-28 DIAGNOSIS — R2681 Unsteadiness on feet: Secondary | ICD-10-CM

## 2023-07-28 DIAGNOSIS — M79605 Pain in left leg: Secondary | ICD-10-CM

## 2023-07-28 DIAGNOSIS — Z7409 Other reduced mobility: Secondary | ICD-10-CM

## 2023-07-28 DIAGNOSIS — M6281 Muscle weakness (generalized): Secondary | ICD-10-CM

## 2023-07-28 NOTE — Therapy (Signed)
 OUTPATIENT PHYSICAL THERAPY PROSTHETIC TREATMENT   Patient Name: [name] MRN: [id number] DOB:January 28, 1951, 73 y.o., male Today's Date: 07/28/2023  END OF SESSION:  PT End of Session - 07/28/23 0926     Visit Number 11    Number of Visits 25    Date for PT Re-Evaluation 08/28/23    Authorization Type [employer] Medicare    Authorization Time Period [employer] MEDICARE $20 COPAY 16 PT VISITS APPROVED 2/4-5/27/25    Authorization - Visit Number 11    Authorization - Number of Visits 16    Progress Note Due on Visit 20    PT Start Time 0927    PT Stop Time 1013    PT Time Calculation (min) 46 min    Equipment Utilized During Treatment Gait belt    Activity Tolerance Patient tolerated treatment well;Patient limited by pain;Patient limited by fatigue    Behavior During Therapy [facility] for tasks assessed/performed                    Past Medical History:  Diagnosis Date   Aortic stenosis, mild    Arthritis of hand    "just a little bit in both hands" (03/31/2012)   Asthma    "little bit" (03/31/2012)   Atrial fibrillation (HCC)    dx '04; DCCV '04, placed on flecainide, failed DCCV 04/2010, flecainide stopped; s/p successful A.fib ablation 01/31/12   CHF (congestive heart failure) (HCC)    Controlled type 2 diabetes mellitus without complication, without long-term current use of insulin (HCC) 12/13/2019   COPD (chronic obstructive pulmonary disease) (HCC)    DJD (degenerative joint disease)    Fatty liver    Fibromyalgia    GERD (gastroesophageal reflux disease)    Hyperlipidemia    Hypertension    Hypothyroidism    S/P radiation   Left atrial enlargement    LA size 45mm by echo 11/21/11   Mitral regurgitation    trivial   Obstructive sleep apnea    mild by sleep study 2013; pt stated he does not have a machine because "it wasn't bad enough for him to have one"   Pacemaker 03/31/2012   Pneumonia    last time 2023   Restless leg syndrome    Second degree AV block     Splenomegaly    Past Surgical History:  Procedure Laterality Date   AMPUTATION Right 08/23/2022   Procedure: RIGHT FOOT FIFTH RAY AMPUTATION;  Surgeon: [name], MD;  Location: [facility] OR;  Service: Orthopedics;  Laterality: Right;   AMPUTATION Right 09/04/2022   Procedure: RIGHT BELOW KNEE AMPUTATION;  Surgeon: [name], MD;  Location: [facility] OR;  Service: Orthopedics;  Laterality: Right;   AORTIC VALVE REPLACEMENT N/A 08/22/2015   Procedure: AORTIC VALVE REPLACEMENT (AVR);  Surgeon: Kerin Perna, MD;  Location: [facility] OR;  Service: Open Heart Surgery;  Laterality: N/A;   ATRIAL FIBRILLATION ABLATION  01/30/2012   PVI by Dr. Johney Frame   ATRIAL FIBRILLATION ABLATION N/A 01/31/2012   Procedure: ATRIAL FIBRILLATION ABLATION;  Surgeon: [name], MD;  Location: [facility] CATH LAB;  Service: Cardiovascular;  Laterality: N/A;   ATRIAL FIBRILLATION ABLATION N/A 04/19/2020   Procedure: ATRIAL FIBRILLATION ABLATION;  Surgeon: [name], MD;  Location: [facility] INVASIVE CV LAB;  Service: Cardiovascular;  Laterality: N/A;   BACK SURGERY     X 3   BIOPSY  01/01/2022   Procedure: BIOPSY;  Surgeon: Kathi Der, MD;  Location: [facility] ENDOSCOPY;  Service: Gastroenterology;;  CARDIAC CATHETERIZATION N/A 08/09/2015   Procedure: Right/Left Heart Cath and Coronary Angiography;  Surgeon: Peter M Swaziland, MD;  Location: [facility] INVASIVE CV LAB;  Service: Cardiovascular;  Laterality: N/A;   CARDIAC CATHETERIZATION N/A 02/05/2016   Procedure: Right/Left Heart Cath and Coronary Angiography;  Surgeon: Corky Crafts, MD;  Location: [facility] INVASIVE CV LAB;  Service: Cardiovascular;  Laterality: N/A;   CARDIAC CATHETERIZATION N/A 04/17/2016   Procedure: Right Heart Cath;  Surgeon: Dolores Patty, MD;  Location: [facility] INVASIVE CV LAB;  Service: Cardiovascular;  Laterality: N/A;   CARDIOVASCULAR STRESS TEST  03/12/2002   EF 48%, NO EVIDENCE OF ISCHEMIA   CARDIOVERSION  01/2012; 03/31/2012   CARDIOVERSION N/A 02/25/2012    Procedure: CARDIOVERSION;  Surgeon: [name], MD;  Location: [facility] CATH LAB;  Service: Cardiovascular;  Laterality: N/A;   CARDIOVERSION N/A 03/31/2012   Procedure: CARDIOVERSION;  Surgeon: [name], MD;  Location: [facility] CATH LAB;  Service: Cardiovascular;  Laterality: N/A;   CARDIOVERSION N/A 11/23/2015   Procedure: CARDIOVERSION;  Surgeon: Laurey Morale, MD;  Location: [facility] ENDOSCOPY;  Service: Cardiovascular;  Laterality: N/A;   CARDIOVERSION N/A 04/08/2016   Procedure: CARDIOVERSION;  Surgeon: Dolores Patty, MD;  Location: [facility] ENDOSCOPY;  Service: Cardiovascular;  Laterality: N/A;   CARDIOVERSION N/A 09/14/2018   Procedure: CARDIOVERSION;  Surgeon: Wendall Stade, MD;  Location: [facility] ENDOSCOPY;  Service: Cardiovascular;  Laterality: N/A;   CARDIOVERSION N/A 10/19/2019   Procedure: CARDIOVERSION;  Surgeon: Dolores Patty, MD;  Location: [facility] ENDOSCOPY;  Service: Cardiovascular;  Laterality: N/A;   CARDIOVERSION N/A 02/25/2020   Procedure: CARDIOVERSION;  Surgeon: Pricilla Riffle, MD;  Location: [facility] ENDOSCOPY;  Service: Cardiovascular;  Laterality: N/A;   CARDIOVERSION N/A 05/25/2020   Procedure: CARDIOVERSION;  Surgeon: Lewayne Bunting, MD;  Location: [facility] ENDOSCOPY;  Service: Cardiovascular;  Laterality: N/A;   CARDIOVERSION N/A 07/10/2020   Procedure: CARDIOVERSION;  Surgeon: Jake Bathe, MD;  Location: [facility] ENDOSCOPY;  Service: Cardiovascular;  Laterality: N/A;   CLIPPING OF ATRIAL APPENDAGE N/A 08/22/2015   Procedure: CLIPPING OF ATRIAL APPENDAGE;  Surgeon: Kerin Perna, MD;  Location: [facility] OR;  Service: Open Heart Surgery;  Laterality: N/A;   COLONOSCOPY WITH PROPOFOL N/A 01/01/2022   Procedure: COLONOSCOPY WITH PROPOFOL;  Surgeon: Kathi Der, MD;  Location: [facility] ENDOSCOPY;  Service: Gastroenterology;  Laterality: N/A;   DOPPLER ECHOCARDIOGRAPHY  03/11/2002   EF 70-75%   FINGER SURGERY Left    Middle finger   FINGER TENDON REPAIR  1980's   "right little finger" (03/31/2012)    FLEXIBLE SIGMOIDOSCOPY N/A 12/30/2021   Procedure: FLEXIBLE SIGMOIDOSCOPY;  Surgeon: Charlott Rakes, MD;  Location: [facility] ENDOSCOPY;  Service: Gastroenterology;  Laterality: N/A;   INSERT / REPLACE / REMOVE PACEMAKER     St Jude   IR RADIOLOGIST EVAL & MGMT  05/06/2019   IR RADIOLOGIST EVAL & MGMT  10/06/2019   IR RADIOLOGIST EVAL & MGMT  10/31/2020   IR RADIOLOGIST EVAL & MGMT  01/18/2021   IR RADIOLOGIST EVAL & MGMT  03/08/2021   IR RADIOLOGIST EVAL & MGMT  06/13/2021   KIDNEY SURGERY Left    LEFT AND RIGHT HEART CATHETERIZATION WITH CORONARY ANGIOGRAM N/A 10/27/2012   Procedure: LEFT AND RIGHT HEART CATHETERIZATION WITH CORONARY ANGIOGRAM;  Surgeon: Pamella Pert, MD;  Location: [facility] CATH LAB;  Service: Cardiovascular;  Laterality: N/A;   LUMBAR DISC SURGERY  1980's X2;  2000's   MAZE N/A 08/22/2015   Procedure: MAZE;  Surgeon: Kathlee Nations Trigt,  MD;  Location: [facility] OR;  Service: Open Heart Surgery;  Laterality: N/A;   PACEMAKER INSERTION  03/31/2012   STJ Accent DR pacemaker implanted by Dr Johney Frame   PERMANENT PACEMAKER INSERTION N/A 03/31/2012   Procedure: PERMANENT PACEMAKER INSERTION;  Surgeon: [name], MD;  Location: [facility] CATH LAB;  Service: Cardiovascular;  Laterality: N/A;   PPM GENERATOR CHANGEOUT N/A 08/08/2020   Procedure: PPM GENERATOR CHANGEOUT;  Surgeon: [name], MD;  Location: [facility] INVASIVE CV LAB;  Service: Cardiovascular;  Laterality: N/A;   RADIOLOGY WITH ANESTHESIA Left 02/14/2021   Procedure: RADIOLOGY WITH ANESTHESIA LEFT RENAL CRYOABLATION;  Surgeon: Irish Lack, MD;  Location: [facility] ORS;  Service: Radiology;  Laterality: Left;   STUMP REVISION Right 10/09/2022   Procedure: REVISION RIGHT BELOW KNEE AMPUTATION;  Surgeon: [name], MD;  Location: [facility] OR;  Service: Orthopedics;  Laterality: Right;   STUMP REVISION Right 11/16/2022   Procedure: REVISION RIGHT BELOW KNEE AMPUTATION;  Surgeon: [name], MD;  Location: [facility] OR;  Service: Orthopedics;   Laterality: Right;   TEE WITHOUT CARDIOVERSION  01/30/2012   Procedure: TRANSESOPHAGEAL ECHOCARDIOGRAM (TEE);  Surgeon: Laurey Morale, MD;  Location: [facility] ENDOSCOPY;  Service: Cardiovascular;  Laterality: N/A;  ablation next day   TEE WITHOUT CARDIOVERSION N/A 08/22/2015   Procedure: TRANSESOPHAGEAL ECHOCARDIOGRAM (TEE);  Surgeon: Kerin Perna, MD;  Location: [facility] OR;  Service: Open Heart Surgery;  Laterality: N/A;   TEE WITHOUT CARDIOVERSION N/A 10/19/2019   Procedure: TRANSESOPHAGEAL ECHOCARDIOGRAM (TEE);  Surgeon: Dolores Patty, MD;  Location: [facility] ENDOSCOPY;  Service: Cardiovascular;  Laterality: N/A;   US ECHOCARDIOGRAPHY  08/07/2009   EF 55-60%   Patient Active Problem List   Diagnosis Date Noted   AKI (acute kidney injury) (HCC) 12/22/2022   Fall 11/14/2022   Pain of right lower extremity due to injury 11/14/2022   Diabetic ulcer of lower leg (HCC) 11/13/2022   Osteomyelitis (HCC) 11/13/2022   Dyslipidemia 10/18/2022   Cellulitis of right lower extremity 10/17/2022   Dehiscence of amputation stump of right lower extremity (HCC) 10/09/2022   Below-knee amputation of right lower extremity (HCC) 10/09/2022   Gangrene of right foot (HCC) 09/04/2022   History of below-knee amputation of right lower extremity (HCC) 09/04/2022   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) 08/23/2022   CAP (community acquired pneumonia) 12/28/2021   Constipation 12/28/2021   Pain in both lower extremities 07/03/2021   Peripheral neuropathy 07/03/2021   Acute metabolic encephalopathy 06/09/2021   Marijuana dependence (HCC) 06/09/2021   DNR (do not resuscitate) 06/09/2021   Chest pain 06/08/2021   Renal lesion 02/14/2021   Status post cryoablation 02/14/2021   Allergic rhinitis 12/08/2020   Preop pulmonary/respiratory exam 03/27/2020   Acute on chronic diastolic (congestive) heart failure (HCC) 01/22/2020   Abdominal pain 01/22/2020   Hypothyroidism 12/13/2019   DM2 (diabetes mellitus,  type 2) (HCC) 12/13/2019   Chronic kidney disease, stage 3b (HCC) 12/13/2019   Acute bacterial bronchitis 12/13/2019   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   Typical atrial flutter (HCC)    Chronic respiratory failure with hypoxia (HCC) 05/23/2016   CAD (coronary artery disease) 05/23/2016   Persistent atrial fibrillation (HCC)    Atypical atrial flutter (HCC)    Visit for monitoring Tikosyn therapy 04/16/2016   Chronic diastolic CHF (congestive heart failure) (HCC) 02/03/2016   Cough 12/06/2015   Acute on chronic respiratory failure with hypoxia (HCC) 09/08/2015   Hallucinations 09/04/2015   Lymphadenopathy 09/04/2015   Ascending aortic  aneurysm (HCC) 09/04/2015   Hypoxia 09/02/2015   S/P aortic valve replacement with bioprosthetic valve 09/02/2015   Pleural effusion 09/02/2015   S/P AVR 08/22/2015   GERD without esophagitis 09/20/2014   Intrinsic asthma 10/13/2013   Dyspnea 09/15/2013   Pulmonary hypertension (HCC) 09/15/2013   Shortness of breath 05/05/2013   Pacemaker-St.Jude 04/01/2012   Bradycardia 03/31/2012   Second degree AV block 03/31/2012   AV block, 1st degree 02/01/2012   PAF (paroxysmal atrial fibrillation) (HCC) 11/13/2011   Fatigue 11/13/2011   Essential hypertension 11/13/2011   Aortic stenosis 11/13/2011   Sleep apnea 11/13/2011    PCP: Alysia Penna, MD  REFERRING PROVIDER: Aldean Baker, MD  ONSET DATE: 04/01/2023 prosthesis delivery  REFERRING DIAG: Z89.511 (ICD-10-CM) - S/P BKA (below knee amputation), right   THERAPY DIAG:  Other abnormalities of gait and mobility  Unsteadiness on feet  Pain in right leg  Pain in left leg  Muscle weakness (generalized)  Impaired functional mobility, balance, gait, and endurance  Rationale for Evaluation and Treatment: Rehabilitation  SUBJECTIVE:   SUBJECTIVE STATEMENT: He is walking more these days.  He rode in car 1.5 hours and removed prosthesis only during ride. No issues getting prostheses back on  limb.   PERTINENT HISTORY:  right TTA 09/04/22, DM2, HLD, asthma, COPD, A-Fib, CAD, pacemaker, cardioversion & catheterization, arthritis, Fibromyalgia, GERD, HTN,   PAIN:  Are you having pain? Yes: NPRS scale:  today  8/10 Right leg, 0/10 left leg  and in last week down lower both legs Pain location: both legs Pain description: neuropathy, sharp, sticking "big" needles in leg Aggravating factors: meds wearing off,  standing & walking Relieving factors: pain pill 2x/day  Residual limb prosthesis on limb 0/10 - 10/10,  stand or walk 5/10 - 10/10  PRECAUTIONS: Fall and ICD/Pacemaker  WEIGHT BEARING RESTRICTIONS: No  FALLS: Has patient fallen in last 6 months? Yes. Number of falls 1 no injuries  LIVING ENVIRONMENT: Lives with: lives with their spouse and 3 dogs (mini schnauzer 15# indoor & 2 german [place]ige[place] outdoor)  Lives in: [place] Home Access: Ramped entrance Home layout: One level Stairs: Yes: External: 6 steps; can reach both ramp at primary entrance Has following equipment at home: Single point cane, Environmental consultant - 2 wheeled, Wheelchair (manual), shower chair, Tour manager, and Ramped entry  OCCUPATION: retired from [employer]  PLOF: Independent, Independent with household mobility without device, and Independent with community mobility without device used oxygen as needed  PATIENT GOALS:  use prosthesis to function in community & in house, do odd repairs, yard work  OBJECTIVE:  COGNITION: Overall cognitive status: Within functional limits for tasks assessed   SENSATION: WFL  CARDIOVASCULAR RESPONSE: Functional activity: balance and gait assessments Pre-activity vitals: no oxygen seated in w/c HR: 67 SpO2: 93 Post-activity vitals: Berg with no oxygen  HR: 78 SpO2: 90-91%  94% on oxygen at rest, HR 70 After gait with O2 SpO2 94%, HR 73  Modified Borg scale for dyspnea: 2: mild shortness of breath  POSTURE: rounded shoulders, forward head, flexed trunk , and weight  shift left  LOWER EXTREMITY ROM:  ROM P:passive  A:active Right eval  Hip flexion   Hip extension standing -10*   Hip abduction   Hip adduction   Hip internal rotation   Hip external rotation   Knee flexion   Knee extension   Ankle dorsiflexion   Ankle plantarflexion   Ankle inversion   Ankle eversion    (Blank rows = not  tested)  LOWER EXTREMITY MMT:  MMT Right eval Left eval  Hip flexion    Hip extension 3-4/5 3-4/5  Hip abduction 3-4/5 4/5  Hip adduction    Hip internal rotation    Hip external rotation    Knee flexion 3-4/5 4/5  Knee extension 3-4/5 4/5  Ankle dorsiflexion    Ankle plantarflexion    Ankle inversion    Ankle eversion    At Evaluation all strength testing is grossly seated and functionally standing / gait. (Blank rows = not tested)  TRANSFERS: Evaluation on 06/03/2023:    Sit to stand: SBA from 20" w/c using BUEs on armrests to arise stabilizes with back of legs against w/c Stand to sit: SBA uses BUEs on armrests of w/c to control descent  FUNCTIONAL TESTs:  Evaluation on 06/03/2023:   Berg Balance Scale: 19/56  GAIT: Evaluation on 06/03/2023:    Gait pattern: step to pattern, decreased step length- Left, decreased stance time- Right, decreased hip/knee flexion- Right, decreased ankle dorsiflexion- Right, Right hip hike, knee flexed in stance- Right, lateral hip instability, trunk flexed, and abducted- Right Distance walked: 25' Assistive device utilized: Environmental consultant - 2 wheeled and TTA prosthesis Level of assistance: Min A balance loss to left on straight path & 180* turn Comments: used O2 with portable unit slung over left shoulder,  pain in residual limb limited distance,   CURRENT PROSTHETIC WEAR ASSESSMENT: Evaluation on 06/03/2023:    Patient is dependent with: skin check, residual limb care, care of non-amputated limb, prosthetic cleaning, ply sock cleaning, correct ply sock adjustment, proper wear schedule/adjustment, and proper weight-bearing  schedule/adjustment Donning prosthesis: dependent with PT 100% cues on technique Doffing prosthesis: reports modified independent Prosthetic wear tolerance: 2-4 hours, 1x/day, ~3 days/week Prosthetic weight bearing tolerance: 5 minutes with partial weight on prosthesis with pain increasing to 8/10 (intolerable) Edema: pitting with 7 sec capillary refill Residual limb condition: dry skin especially distal limb, 3 scratches with superficial scab on distal limb, scar healed but adhered over entire area of distal tibia, bulbous shape,  Prosthetic description: silicon liner with pin lock suspension, total contact socket with flexible inner socket, dynamic response foot K code/activity level with prosthetic use: Level 3    TODAY'S TREATMENT:                                                                                                                             DATE:  07/28/2023: Prosthetic Care with TTA prosthesis: PT demo & verbal cues on picking up items from floor.  Pt able to safely return demo with support on seat of locked rollator walker.   Pt amb 25' X 2 with cane & HHA maxA 1st time for balance issues and min to modA 2nd.  PT demo & verbal cues on step through pattern and weight shift over prosthesis in stance which improved balance.   PT educated on fall risk when prosthesis is off, using prosthesis to enter/exit for  shower to tub bench and keeping w/c close to bed at night.  PT also demo & verbal cues on using DryPro bag for shower &/or pool.  Pt and wife verbalized understanding.    TREATMENT:                                                                                                                             DATE:  07/23/2023: Prosthetic Care with TTA prosthesis: PT instructed with demo & verbal cues on use of cut off socks to increase fit distally where limb shrinks more than knee area. He arrived with 9 ply full length socks and PT switched to 10-ply (5 full & 5 cutoff). Pt  reports that his limb felt better. Pt and wife verbalize understanding.  PT instructed in use of top portion of cutoff socks under liner along distal thigh as wick for sweat which should help the skin irritation there.  Pt & wife verbalized understanding.   Self-Care: PT instructed pt & wife in rating pain with idea that 10/10 is max and nothing could make the pain increase.  With mobility the goal for pain management is limit heavy spikes that would limit function later in the day or the next day.  If walking with rollator walker and pain increases more than 2 increments on 0-10 scale, then he should sit to rest until pain decreases close to baseline or starting level.  Also awareness that less UE support like cane uses only 1 hand support and no device would have no UE assist to unload weight or intensity of LE muscle activity. PT also educated on his pain meds that Gabapentin should be taken regularly / time wise as directed by MD and Oxy more when pain starts to go up / not wait until it peaks (but awareness of time so not taken 2nd dose within 8 hours as directed by MD).  Pt and wife verbalized understanding.     TREATMENT:                                                                                                                             DATE:  07/21/2023: Prosthetic Care with TTA prosthesis: -Ambulation with cane at sink fwd and bwd walking MinA for wt shift at hip, vc for UE support and sequencing. Patient requiring 2 long seated rest breaks between bouts. Demonstrated good and safe use of rollator.  -PT  demo & verbal cues on technique: Ambulation with cane at sink side stepping CGA with patient using RUE to hover over sink. Required seated rest break between bouts.    -Educated on short/medium/long walk for activity tolerance, handout provided with patient and wife verbalizing understanding.  -throughout session patient performed STS from rollator demonstrating good safety and balance  when standing/sitting/moving rollator into position.     PATIENT EDUCATION: PATIENT EDUCATED ON FOLLOWING PROSTHETIC CARE: Education details:  Skin check, Residual limb care, Prosthetic cleaning, Correct ply sock adjustment, Propper donning, and Proper wear schedule/adjustment Prosthetic wear tolerance: 2 hours 2x/day, 7 days/week Person educated: Patient and Spouse Education method: Explanation, Demonstration, Tactile cues, and Verbal cues Education comprehension: verbalized understanding, verbal cues required, tactile cues required, and needs further education  HOME EXERCISE PROGRAM: Printed and issued 06/25/23 Do each exercise 1-2  times per day Do each exercise 5-10 repetitions Hold each exercise for 2 seconds to feel your location  AT SINK FIND YOUR MIDLINE POSITION AND PLACE FEET EQUAL DISTANCE FROM THE MIDLINE.  Try to find this position when standing still for activities.   USE TAPE ON FLOOR TO MARK THE MIDLINE POSITION which is even with middle of sink.  You also should try to feel with your limb pressure in socket.  You are trying to feel with limb what you used to feel with the bottom of your foot.  Side to Side Shift: Moving your hips only (not shoulders): move weight onto your left leg, HOLD/FEEL pressure in socket.  Move back to equal weight on each leg, HOLD/FEEL pressure in socket. Move weight onto your right leg, HOLD/FEEL pressure in socket. Move back to equal weight on each leg, HOLD/FEEL pressure in socket. Repeat.  Start with both hands on sink, progress to hand on prosthetic side only, then no hands.  Front to Back Shift: Moving your hips only (not shoulders): move your weight forward onto your toes, HOLD/FEEL pressure in socket. Move your weight back to equal Flat Foot on both legs, HOLD/FEEL  pressure in socket. Move your weight back onto your heels, HOLD/FEEL  pressure in socket. Move your weight back to equal on both legs, HOLD/FEEL  pressure in socket. Repeat.  Start  with both hands on sink, progress to hand on prosthetic side only, then no hands.  Moving Cones / Cups: With equal weight on each leg: Hold on with one hand the first time, then progress to no hand supports. Move cups from one side of sink to the other. Place cups ~2" out of your reach, progress to 10" beyond reach.  Place one hand in middle of sink and reach with other hand. Do both arms.  Then hover one hand and move cups with other hand.  Overhead/Upward Reaching: alternated reaching up to top cabinets or ceiling if no cabinets present. Keep equal weight on each leg. Start with one hand support on counter while other hand reaches and progress to no hand support with reaching.  ace one hand in middle of sink and reach with other hand. Do both arms.  Then hover one hand and move cups with other hand.  5.   Looking Over Shoulders: With equal weight on each leg: alternate turning to look over your shoulders with one hand support on counter as needed.  Start with head motions only to look in front of shoulder, then even with shoulder and progress to looking behind you. To look to side, move head /eyes, then shoulder on side looking  pulls back, shift more weight to side looking and pull hip back. Place one hand in middle of sink and let go with other hand so your shoulder can pull back. Switch hands to look other way.   Then hover one hand and look over shoulder. If looking right, use left hand at sink. If looking left, use right hand at sink. 6.  Stepping with leg that is not amputated:  Move items under cabinet out of your way. Shift your hips/pelvis so weight on prosthesis. Tighten muscles in hip on prosthetic side.  SLOWLY step other leg so front of foot is in cabinet. Then step back to floor.   ASSESSMENT:  CLINICAL IMPRESSION: Patient is slowly improving his mobility with prosthesis but continues to be limited by pain.  He seems to understand picking up items from floor and use of DryPro bag.  Patient  continues to benefit from skilled physical therapy.    OBJECTIVE IMPAIRMENTS: Abnormal gait, cardiopulmonary status limiting activity, decreased activity tolerance, decreased balance, decreased endurance, decreased knowledge of condition, decreased knowledge of use of DME, decreased mobility, difficulty walking, decreased ROM, decreased strength, increased edema, prosthetic dependency , and pain.   ACTIVITY LIMITATIONS: carrying, lifting, bending, standing, stairs, transfers, and locomotion level  PARTICIPATION LIMITATIONS: meal prep, cleaning, driving, community activity, and yard work  PERSONAL FACTORS: Fitness, Time since onset of injury/illness/exacerbation, and 3+ comorbidities: see PMH  are also affecting patient's functional outcome.   REHAB POTENTIAL: Good  CLINICAL DECISION MAKING: Evolving/moderate complexity  EVALUATION COMPLEXITY: Moderate   GOALS: Goals reviewed with patient? Yes  SHORT TERM GOALS: Target date: 07/02/2023  Patient donnes prosthesis modified independent & verbalizes proper cleaning. Baseline: SEE OBJECTIVE DATA Goal status:   MET 06/30/2023 2.  Patient tolerates prosthesis >6 hrs total /day without skin issues or limb pain >5/10 after standing. Baseline: SEE OBJECTIVE DATA Goal status: Partially MET 06/30/2023  3.  Patient able to reach 5" and looks to sides without UE support with supervision. Baseline: SEE OBJECTIVE DATA Goal status: MET 07/07/2023  4. Patient ambulates 37' with RW & prosthesis with supervision. Baseline: SEE OBJECTIVE DATA Goal status: MET 07/07/2023  5. Patient negotiates ramps & curbs with RW & prosthesis with minA. Baseline: SEE OBJECTIVE DATA Goal status: MET 06/23/2023  LONG TERM GOALS: Target date: 08/28/2023  Patient demonstrates & verbalized understanding of prosthetic care to enable safe utilization of prosthesis. Baseline: SEE OBJECTIVE DATA Goal status:   Ongoing 07/28/2023  Patient tolerates prosthesis wear >90% of awake  hours without skin or limb pain issues. Baseline: SEE OBJECTIVE DATA Goal status: Ongoing 07/28/2023  Berg Balance >36/56 to indicate lower fall risk Baseline: SEE OBJECTIVE DATA Goal status: Ongoing 07/28/2023  Patient ambulates >300' with prosthesis & LRAD independently Baseline: SEE OBJECTIVE DATA Goal status: Ongoing 07/28/2023  Patient negotiates ramps, curbs & stairs with single rail with prosthesis & LRAD independently. Baseline: SEE OBJECTIVE DATA Goal status: Ongoing 07/28/2023   PLAN:  PT FREQUENCY: 2x/week  PT DURATION: 12 weeks  PLANNED INTERVENTIONS: 97164- PT Re-evaluation, 97110-Therapeutic exercises, 97530- Therapeutic activity, 97112- Neuromuscular re-education, [id number]- Self Care, 60454- Gait training, [phone]- Prosthetic training, Patient/Family education, Balance training, Stair training, Scar mobilization, Vestibular training, DME instructions, and Physical Performance Testing  PLAN FOR NEXT SESSION:  Continue ambulation with cane, continue to progress ambulation tolerance as patient is fit, standing balance activities.    Date of referral: 04/17/2023  PT eval delayed by COPD exacerbation Referring provider: Aldean Baker, MD Referring diagnosis? Z89.511 (ICD-10-CM) - S/P  BKA (below knee amputation), right Treatment diagnosis? (if different than referring diagnosis)   Other abnormalities of gait & mobility R26.89 Unsteadiness on feet R26.81 Pain in left leg M79.605 Pain in right leg M79.604 Muscle Weakness (generalized) M62.81 History of falling Z91.81 Impaired functional mobility, balance, gait and endurance  What was this (referring dx) caused by? Surgery (Type: right BKA)  Nature of Condition: Initial Onset (within last 3 months)  prosthesis delivery 04/01/2023   Laterality: Rt  Current Functional Measure Score: Other Berg Balance 19/56  Objective measurements identify impairments when they are compared to normal values, the uninvolved extremity, and  prior level of function.  [x]  Yes  []  No  Objective assessment of functional ability: Severe functional limitations   Briefly describe symptoms: dependent in care & use of prosthesis, impaired balance and gait, pain in both legs  How did symptoms start: amputation from wounds  Average pain intensity:  Last 24 hours: 8/10  Past week: 8/10  How often does the pt experience symptoms? Constantly  How much have the symptoms interfered with usual daily activities? Extremely  How has condition changed since care began at this facility? NA - initial visit  In general, how is the patients overall health? Good   BACK PAIN (STarT Back Screening Tool) No   Vladimir Faster, PT, DPT 07/28/2023, 10:31 AM

## 2023-07-30 ENCOUNTER — Encounter: Payer: Self-pay | Admitting: Physical Therapy

## 2023-07-30 ENCOUNTER — Ambulatory Visit: Admitting: Physical Therapy

## 2023-07-30 DIAGNOSIS — R2681 Unsteadiness on feet: Secondary | ICD-10-CM

## 2023-07-30 DIAGNOSIS — Z7409 Other reduced mobility: Secondary | ICD-10-CM

## 2023-07-30 DIAGNOSIS — M79605 Pain in left leg: Secondary | ICD-10-CM | POA: Diagnosis not present

## 2023-07-30 DIAGNOSIS — M79604 Pain in right leg: Secondary | ICD-10-CM

## 2023-07-30 DIAGNOSIS — M6281 Muscle weakness (generalized): Secondary | ICD-10-CM

## 2023-07-30 DIAGNOSIS — R2689 Other abnormalities of gait and mobility: Secondary | ICD-10-CM | POA: Diagnosis not present

## 2023-07-30 NOTE — Therapy (Signed)
 OUTPATIENT PHYSICAL THERAPY PROSTHETIC TREATMENT   Patient Name: [name] MRN: [id number] DOB:08-Oct-1950, 73 y.o., male Today's Date: 07/30/2023  END OF SESSION:  PT End of Session - 07/30/23 0943     Visit Number 12    Number of Visits 25    Date for PT Re-Evaluation 08/28/23    Authorization Type [employer] Medicare    Authorization Time Period [employer] MEDICARE $20 COPAY 16 PT VISITS APPROVED 2/4-5/27/25    Authorization - Visit Number 12    Authorization - Number of Visits 16    Progress Note Due on Visit 20    PT Start Time 0937    PT Stop Time 1015    PT Time Calculation (min) 38 min    Equipment Utilized During Treatment Gait belt    Activity Tolerance Patient tolerated treatment well;Patient limited by pain;Patient limited by fatigue    Behavior During Therapy [facility] for tasks assessed/performed                     Past Medical History:  Diagnosis Date   Aortic stenosis, mild    Arthritis of hand    "just a little bit in both hands" (03/31/2012)   Asthma    "little bit" (03/31/2012)   Atrial fibrillation (HCC)    dx '04; DCCV '04, placed on flecainide, failed DCCV 04/2010, flecainide stopped; s/p successful A.fib ablation 01/31/12   CHF (congestive heart failure) (HCC)    Controlled type 2 diabetes mellitus without complication, without long-term current use of insulin (HCC) 12/13/2019   COPD (chronic obstructive pulmonary disease) (HCC)    DJD (degenerative joint disease)    Fatty liver    Fibromyalgia    GERD (gastroesophageal reflux disease)    Hyperlipidemia    Hypertension    Hypothyroidism    S/P radiation   Left atrial enlargement    LA size 45mm by echo 11/21/11   Mitral regurgitation    trivial   Obstructive sleep apnea    mild by sleep study 2013; pt stated he does not have a machine because "it wasn't bad enough for him to have one"   Pacemaker 03/31/2012   Pneumonia    last time 2023   Restless leg syndrome    Second degree AV block     Splenomegaly    Past Surgical History:  Procedure Laterality Date   AMPUTATION Right 08/23/2022   Procedure: RIGHT FOOT FIFTH RAY AMPUTATION;  Surgeon: [name], MD;  Location: [facility] OR;  Service: Orthopedics;  Laterality: Right;   AMPUTATION Right 09/04/2022   Procedure: RIGHT BELOW KNEE AMPUTATION;  Surgeon: [name], MD;  Location: [facility] OR;  Service: Orthopedics;  Laterality: Right;   AORTIC VALVE REPLACEMENT N/A 08/22/2015   Procedure: AORTIC VALVE REPLACEMENT (AVR);  Surgeon: Kerin Perna, MD;  Location: [facility] OR;  Service: Open Heart Surgery;  Laterality: N/A;   ATRIAL FIBRILLATION ABLATION  01/30/2012   PVI by Dr. Johney Frame   ATRIAL FIBRILLATION ABLATION N/A 01/31/2012   Procedure: ATRIAL FIBRILLATION ABLATION;  Surgeon: [name], MD;  Location: [facility] CATH LAB;  Service: Cardiovascular;  Laterality: N/A;   ATRIAL FIBRILLATION ABLATION N/A 04/19/2020   Procedure: ATRIAL FIBRILLATION ABLATION;  Surgeon: [name], MD;  Location: [facility] INVASIVE CV LAB;  Service: Cardiovascular;  Laterality: N/A;   BACK SURGERY     X 3   BIOPSY  01/01/2022   Procedure: BIOPSY;  Surgeon: Kathi Der, MD;  Location: [facility] ENDOSCOPY;  Service:  Gastroenterology;;   CARDIAC CATHETERIZATION N/A 08/09/2015   Procedure: Right/Left Heart Cath and Coronary Angiography;  Surgeon: Peter M Swaziland, MD;  Location: [facility] INVASIVE CV LAB;  Service: Cardiovascular;  Laterality: N/A;   CARDIAC CATHETERIZATION N/A 02/05/2016   Procedure: Right/Left Heart Cath and Coronary Angiography;  Surgeon: Corky Crafts, MD;  Location: [facility] INVASIVE CV LAB;  Service: Cardiovascular;  Laterality: N/A;   CARDIAC CATHETERIZATION N/A 04/17/2016   Procedure: Right Heart Cath;  Surgeon: Dolores Patty, MD;  Location: [facility] INVASIVE CV LAB;  Service: Cardiovascular;  Laterality: N/A;   CARDIOVASCULAR STRESS TEST  03/12/2002   EF 48%, NO EVIDENCE OF ISCHEMIA   CARDIOVERSION  01/2012; 03/31/2012   CARDIOVERSION N/A 02/25/2012    Procedure: CARDIOVERSION;  Surgeon: [name], MD;  Location: [facility] CATH LAB;  Service: Cardiovascular;  Laterality: N/A;   CARDIOVERSION N/A 03/31/2012   Procedure: CARDIOVERSION;  Surgeon: [name], MD;  Location: [facility] CATH LAB;  Service: Cardiovascular;  Laterality: N/A;   CARDIOVERSION N/A 11/23/2015   Procedure: CARDIOVERSION;  Surgeon: Laurey Morale, MD;  Location: [facility] ENDOSCOPY;  Service: Cardiovascular;  Laterality: N/A;   CARDIOVERSION N/A 04/08/2016   Procedure: CARDIOVERSION;  Surgeon: Dolores Patty, MD;  Location: [facility] ENDOSCOPY;  Service: Cardiovascular;  Laterality: N/A;   CARDIOVERSION N/A 09/14/2018   Procedure: CARDIOVERSION;  Surgeon: Wendall Stade, MD;  Location: [facility] ENDOSCOPY;  Service: Cardiovascular;  Laterality: N/A;   CARDIOVERSION N/A 10/19/2019   Procedure: CARDIOVERSION;  Surgeon: Dolores Patty, MD;  Location: [facility] ENDOSCOPY;  Service: Cardiovascular;  Laterality: N/A;   CARDIOVERSION N/A 02/25/2020   Procedure: CARDIOVERSION;  Surgeon: Pricilla Riffle, MD;  Location: [facility] ENDOSCOPY;  Service: Cardiovascular;  Laterality: N/A;   CARDIOVERSION N/A 05/25/2020   Procedure: CARDIOVERSION;  Surgeon: Lewayne Bunting, MD;  Location: [facility] ENDOSCOPY;  Service: Cardiovascular;  Laterality: N/A;   CARDIOVERSION N/A 07/10/2020   Procedure: CARDIOVERSION;  Surgeon: Jake Bathe, MD;  Location: [facility] ENDOSCOPY;  Service: Cardiovascular;  Laterality: N/A;   CLIPPING OF ATRIAL APPENDAGE N/A 08/22/2015   Procedure: CLIPPING OF ATRIAL APPENDAGE;  Surgeon: Kerin Perna, MD;  Location: [facility] OR;  Service: Open Heart Surgery;  Laterality: N/A;   COLONOSCOPY WITH PROPOFOL N/A 01/01/2022   Procedure: COLONOSCOPY WITH PROPOFOL;  Surgeon: Kathi Der, MD;  Location: [facility] ENDOSCOPY;  Service: Gastroenterology;  Laterality: N/A;   DOPPLER ECHOCARDIOGRAPHY  03/11/2002   EF 70-75%   FINGER SURGERY Left    Middle finger   FINGER TENDON REPAIR  1980's   "right little finger" (03/31/2012)    FLEXIBLE SIGMOIDOSCOPY N/A 12/30/2021   Procedure: FLEXIBLE SIGMOIDOSCOPY;  Surgeon: Charlott Rakes, MD;  Location: [facility] ENDOSCOPY;  Service: Gastroenterology;  Laterality: N/A;   INSERT / REPLACE / REMOVE PACEMAKER     St Jude   IR RADIOLOGIST EVAL & MGMT  05/06/2019   IR RADIOLOGIST EVAL & MGMT  10/06/2019   IR RADIOLOGIST EVAL & MGMT  10/31/2020   IR RADIOLOGIST EVAL & MGMT  01/18/2021   IR RADIOLOGIST EVAL & MGMT  03/08/2021   IR RADIOLOGIST EVAL & MGMT  06/13/2021   KIDNEY SURGERY Left    LEFT AND RIGHT HEART CATHETERIZATION WITH CORONARY ANGIOGRAM N/A 10/27/2012   Procedure: LEFT AND RIGHT HEART CATHETERIZATION WITH CORONARY ANGIOGRAM;  Surgeon: Pamella Pert, MD;  Location: [facility] CATH LAB;  Service: Cardiovascular;  Laterality: N/A;   LUMBAR DISC SURGERY  1980's X2;  2000's   MAZE N/A 08/22/2015   Procedure: MAZE;  Surgeon:  Kerin Perna, MD;  Location: [facility] OR;  Service: Open Heart Surgery;  Laterality: N/A;   PACEMAKER INSERTION  03/31/2012   STJ Accent DR pacemaker implanted by Dr Johney Frame   PERMANENT PACEMAKER INSERTION N/A 03/31/2012   Procedure: PERMANENT PACEMAKER INSERTION;  Surgeon: [name], MD;  Location: [facility] CATH LAB;  Service: Cardiovascular;  Laterality: N/A;   PPM GENERATOR CHANGEOUT N/A 08/08/2020   Procedure: PPM GENERATOR CHANGEOUT;  Surgeon: [name], MD;  Location: [facility] INVASIVE CV LAB;  Service: Cardiovascular;  Laterality: N/A;   RADIOLOGY WITH ANESTHESIA Left 02/14/2021   Procedure: RADIOLOGY WITH ANESTHESIA LEFT RENAL CRYOABLATION;  Surgeon: Irish Lack, MD;  Location: [facility] ORS;  Service: Radiology;  Laterality: Left;   STUMP REVISION Right 10/09/2022   Procedure: REVISION RIGHT BELOW KNEE AMPUTATION;  Surgeon: [name], MD;  Location: [facility] OR;  Service: Orthopedics;  Laterality: Right;   STUMP REVISION Right 11/16/2022   Procedure: REVISION RIGHT BELOW KNEE AMPUTATION;  Surgeon: [name], MD;  Location: [facility] OR;  Service: Orthopedics;   Laterality: Right;   TEE WITHOUT CARDIOVERSION  01/30/2012   Procedure: TRANSESOPHAGEAL ECHOCARDIOGRAM (TEE);  Surgeon: Laurey Morale, MD;  Location: [facility] ENDOSCOPY;  Service: Cardiovascular;  Laterality: N/A;  ablation next day   TEE WITHOUT CARDIOVERSION N/A 08/22/2015   Procedure: TRANSESOPHAGEAL ECHOCARDIOGRAM (TEE);  Surgeon: Kerin Perna, MD;  Location: [facility] OR;  Service: Open Heart Surgery;  Laterality: N/A;   TEE WITHOUT CARDIOVERSION N/A 10/19/2019   Procedure: TRANSESOPHAGEAL ECHOCARDIOGRAM (TEE);  Surgeon: Dolores Patty, MD;  Location: [facility] ENDOSCOPY;  Service: Cardiovascular;  Laterality: N/A;   US ECHOCARDIOGRAPHY  08/07/2009   EF 55-60%   Patient Active Problem List   Diagnosis Date Noted   AKI (acute kidney injury) (HCC) 12/22/2022   Fall 11/14/2022   Pain of right lower extremity due to injury 11/14/2022   Diabetic ulcer of lower leg (HCC) 11/13/2022   Osteomyelitis (HCC) 11/13/2022   Dyslipidemia 10/18/2022   Cellulitis of right lower extremity 10/17/2022   Dehiscence of amputation stump of right lower extremity (HCC) 10/09/2022   Below-knee amputation of right lower extremity (HCC) 10/09/2022   Gangrene of right foot (HCC) 09/04/2022   History of below-knee amputation of right lower extremity (HCC) 09/04/2022   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) 08/23/2022   CAP (community acquired pneumonia) 12/28/2021   Constipation 12/28/2021   Pain in both lower extremities 07/03/2021   Peripheral neuropathy 07/03/2021   Acute metabolic encephalopathy 06/09/2021   Marijuana dependence (HCC) 06/09/2021   DNR (do not resuscitate) 06/09/2021   Chest pain 06/08/2021   Renal lesion 02/14/2021   Status post cryoablation 02/14/2021   Allergic rhinitis 12/08/2020   Preop pulmonary/respiratory exam 03/27/2020   Acute on chronic diastolic (congestive) heart failure (HCC) 01/22/2020   Abdominal pain 01/22/2020   Hypothyroidism 12/13/2019   DM2 (diabetes mellitus,  type 2) (HCC) 12/13/2019   Chronic kidney disease, stage 3b (HCC) 12/13/2019   Acute bacterial bronchitis 12/13/2019   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   Typical atrial flutter (HCC)    Chronic respiratory failure with hypoxia (HCC) 05/23/2016   CAD (coronary artery disease) 05/23/2016   Persistent atrial fibrillation (HCC)    Atypical atrial flutter (HCC)    Visit for monitoring Tikosyn therapy 04/16/2016   Chronic diastolic CHF (congestive heart failure) (HCC) 02/03/2016   Cough 12/06/2015   Acute on chronic respiratory failure with hypoxia (HCC) 09/08/2015   Hallucinations 09/04/2015   Lymphadenopathy 09/04/2015  Ascending aortic aneurysm (HCC) 09/04/2015   Hypoxia 09/02/2015   S/P aortic valve replacement with bioprosthetic valve 09/02/2015   Pleural effusion 09/02/2015   S/P AVR 08/22/2015   GERD without esophagitis 09/20/2014   Intrinsic asthma 10/13/2013   Dyspnea 09/15/2013   Pulmonary hypertension (HCC) 09/15/2013   Shortness of breath 05/05/2013   Pacemaker-St.Jude 04/01/2012   Bradycardia 03/31/2012   Second degree AV block 03/31/2012   AV block, 1st degree 02/01/2012   PAF (paroxysmal atrial fibrillation) (HCC) 11/13/2011   Fatigue 11/13/2011   Essential hypertension 11/13/2011   Aortic stenosis 11/13/2011   Sleep apnea 11/13/2011    PCP: Alysia Penna, MD  REFERRING PROVIDER: Aldean Baker, MD  ONSET DATE: 04/01/2023 prosthesis delivery  REFERRING DIAG: Z89.511 (ICD-10-CM) - S/P BKA (below knee amputation), right   THERAPY DIAG:  Other abnormalities of gait and mobility  Unsteadiness on feet  Pain in right leg  Pain in left leg  Muscle weakness (generalized)  Impaired functional mobility, balance, gait, and endurance  Rationale for Evaluation and Treatment: Rehabilitation  SUBJECTIVE:   SUBJECTIVE STATEMENT: His neuropathy pain is high today.  He fished yesterday from his [employer] techn[employer].  He caught 4 fish but only one big one.  He was bracing with  legs for large fish.  He cleaned the fish sitting.    PERTINENT HISTORY:  right TTA 09/04/22, DM2, HLD, asthma, COPD, A-Fib, CAD, pacemaker, cardioversion & catheterization, arthritis, Fibromyalgia, GERD, HTN,   PAIN:  Are you having pain? Yes: NPRS scale:  today 10/10 Right leg, 10/10 left leg  and in last week down lower both legs Pain location: both legs Pain description: neuropathy, sharp, sticking "big" needles in leg Aggravating factors: meds wearing off,  standing & walking Relieving factors: pain pill 2x/day  Residual limb prosthesis on limb 0/10 - 10/10,  stand or walk 5/10 - 10/10  PRECAUTIONS: Fall and ICD/Pacemaker  WEIGHT BEARING RESTRICTIONS: No  FALLS: Has patient fallen in last 6 months? Yes. Number of falls 1 no injuries  LIVING ENVIRONMENT: Lives with: lives with their spouse and 3 dogs (mini schnauzer 15# indoor & 2 german [place] outdoor)  Lives in: [place] Home Access: Ramped entrance Home layout: One level Stairs: Yes: External: 6 steps; can reach both ramp at primary entrance Has following equipment at home: Single point cane, Environmental consultant - 2 wheeled, Wheelchair (manual), shower chair, Tour manager, and Ramped entry  OCCUPATION: retired from [employer]  PLOF: Independent, Independent with household mobility without device, and Independent with community mobility without device used oxygen as needed  PATIENT GOALS:  use prosthesis to function in community & in house, do odd repairs, yard work  OBJECTIVE:  COGNITION: Overall cognitive status: Within functional limits for tasks assessed   SENSATION: WFL  CARDIOVASCULAR RESPONSE: Functional activity: balance and gait assessments Pre-activity vitals: no oxygen seated in w/c HR: 67 SpO2: 93 Post-activity vitals: Berg with no oxygen  HR: 78 SpO2: 90-91%  94% on oxygen at rest, HR 70 After gait with O2 SpO2 94%, HR 73  Modified Borg scale for dyspnea: 2: mild shortness of breath  POSTURE: rounded  shoulders, forward head, flexed trunk , and weight shift left  LOWER EXTREMITY ROM:  ROM P:passive  A:active Right eval  Hip flexion   Hip extension standing -10*   Hip abduction   Hip adduction   Hip internal rotation   Hip external rotation   Knee flexion   Knee extension   Ankle dorsiflexion   Ankle plantarflexion  Ankle inversion   Ankle eversion    (Blank rows = not tested)  LOWER EXTREMITY MMT:  MMT Right eval Left eval  Hip flexion    Hip extension 3-4/5 3-4/5  Hip abduction 3-4/5 4/5  Hip adduction    Hip internal rotation    Hip external rotation    Knee flexion 3-4/5 4/5  Knee extension 3-4/5 4/5  Ankle dorsiflexion    Ankle plantarflexion    Ankle inversion    Ankle eversion    At Evaluation all strength testing is grossly seated and functionally standing / gait. (Blank rows = not tested)  TRANSFERS: Evaluation on 06/03/2023:    Sit to stand: SBA from 20" w/c using BUEs on armrests to arise stabilizes with back of legs against w/c Stand to sit: SBA uses BUEs on armrests of w/c to control descent  FUNCTIONAL TESTs:  Evaluation on 06/03/2023:   Berg Balance Scale: 19/56  GAIT: Evaluation on 06/03/2023:    Gait pattern: step to pattern, decreased step length- Left, decreased stance time- Right, decreased hip/knee flexion- Right, decreased ankle dorsiflexion- Right, Right hip hike, knee flexed in stance- Right, lateral hip instability, trunk flexed, and abducted- Right Distance walked: 25' Assistive device utilized: Environmental consultant - 2 wheeled and TTA prosthesis Level of assistance: Min A balance loss to left on straight path & 180* turn Comments: used O2 with portable unit slung over left shoulder,  pain in residual limb limited distance,   CURRENT PROSTHETIC WEAR ASSESSMENT: Evaluation on 06/03/2023:    Patient is dependent with: skin check, residual limb care, care of non-amputated limb, prosthetic cleaning, ply sock cleaning, correct ply sock adjustment, proper  wear schedule/adjustment, and proper weight-bearing schedule/adjustment Donning prosthesis: dependent with PT 100% cues on technique Doffing prosthesis: reports modified independent Prosthetic wear tolerance: 2-4 hours, 1x/day, ~3 days/week Prosthetic weight bearing tolerance: 5 minutes with partial weight on prosthesis with pain increasing to 8/10 (intolerable) Edema: pitting with 7 sec capillary refill Residual limb condition: dry skin especially distal limb, 3 scratches with superficial scab on distal limb, scar healed but adhered over entire area of distal tibia, bulbous shape,  Prosthetic description: silicon liner with pin lock suspension, total contact socket with flexible inner socket, dynamic response foot K code/activity level with prosthetic use: Level 3    TODAY'S TREATMENT:                                                                                                                             DATE:  07/30/2023: Prosthetic Care with TTA prosthesis: PT educated patient on use of barstool for ADLs that he typically would stand.  PT demoed sit to and from stand from a barstool.  Patient able to perform sit to/from stand from 24 inch and 29".  Patient was tolerant of that he could be seated upright with feet planted on floor for balance on either height.  Standard chairs are 18 inches tall.  Use of a barstool  when increases height by 6 or 11 inches enabling upper extremity function and vision improved at counter heights.  Also he has the ability to balance better not requiring upper extremity assist for support.  Patient verbalized understanding and reports he believes it would be of benefit for activities in his workshop, kitchen and bathroom. Patient ambulated 25 feet with table support RUE and cane LUE.  1 rep with standard cane with a stand-alone tip and 1 rep with small base SBQC.  Patient was safe performing this activity with upper extremity support on counter rail or table opposite  of the cane.  PT described the difference between these 2 canes and also LBQC.  Patient reports she was more comfortable with the [facility] and feels like it would be beneficial for trying to do some activities and his workshop in the kitchen.    TREATMENT:                                                                                                                             DATE:  07/28/2023: Prosthetic Care with TTA prosthesis: PT demo & verbal cues on picking up items from floor.  Pt able to safely return demo with support on seat of locked rollator walker.   Pt amb 25' X 2 with cane & HHA maxA 1st time for balance issues and min to modA 2nd.  PT demo & verbal cues on step through pattern and weight shift over prosthesis in stance which improved balance.   PT educated on fall risk when prosthesis is off, using prosthesis to enter/exit for shower to tub bench and keeping w/c close to bed at night.  PT also demo & verbal cues on using DryPro bag for shower &/or pool.  Pt and wife verbalized understanding.    TREATMENT:                                                                                                                             DATE:  07/23/2023: Prosthetic Care with TTA prosthesis: PT instructed with demo & verbal cues on use of cut off socks to increase fit distally where limb shrinks more than knee area. He arrived with 9 ply full length socks and PT switched to 10-ply (5 full & 5 cutoff). Pt reports that his limb felt better. Pt and wife verbalize understanding.  PT instructed in use of top portion of cutoff socks under  liner along distal thigh as wick for sweat which should help the skin irritation there.  Pt & wife verbalized understanding.   Self-Care: PT instructed pt & wife in rating pain with idea that 10/10 is max and nothing could make the pain increase.  With mobility the goal for pain management is limit heavy spikes that would limit function later in the day or the next  day.  If walking with rollator walker and pain increases more than 2 increments on 0-10 scale, then he should sit to rest until pain decreases close to baseline or starting level.  Also awareness that less UE support like cane uses only 1 hand support and no device would have no UE assist to unload weight or intensity of LE muscle activity. PT also educated on his pain meds that Gabapentin should be taken regularly / time wise as directed by MD and Oxy more when pain starts to go up / not wait until it peaks (but awareness of time so not taken 2nd dose within 8 hours as directed by MD).  Pt and wife verbalized understanding.      PATIENT EDUCATION: PATIENT EDUCATED ON FOLLOWING PROSTHETIC CARE: Education details:  Skin check, Residual limb care, Prosthetic cleaning, Correct ply sock adjustment, Propper donning, and Proper wear schedule/adjustment Prosthetic wear tolerance: 2 hours 2x/day, 7 days/week Person educated: Patient and Spouse Education method: Explanation, Demonstration, Tactile cues, and Verbal cues Education comprehension: verbalized understanding, verbal cues required, tactile cues required, and needs further education  HOME EXERCISE PROGRAM: Printed and issued 06/25/23 Do each exercise 1-2  times per day Do each exercise 5-10 repetitions Hold each exercise for 2 seconds to feel your location  AT SINK FIND YOUR MIDLINE POSITION AND PLACE FEET EQUAL DISTANCE FROM THE MIDLINE.  Try to find this position when standing still for activities.   USE TAPE ON FLOOR TO MARK THE MIDLINE POSITION which is even with middle of sink.  You also should try to feel with your limb pressure in socket.  You are trying to feel with limb what you used to feel with the bottom of your foot.  Side to Side Shift: Moving your hips only (not shoulders): move weight onto your left leg, HOLD/FEEL pressure in socket.  Move back to equal weight on each leg, HOLD/FEEL pressure in socket. Move weight onto your right  leg, HOLD/FEEL pressure in socket. Move back to equal weight on each leg, HOLD/FEEL pressure in socket. Repeat.  Start with both hands on sink, progress to hand on prosthetic side only, then no hands.  Front to Back Shift: Moving your hips only (not shoulders): move your weight forward onto your toes, HOLD/FEEL pressure in socket. Move your weight back to equal Flat Foot on both legs, HOLD/FEEL  pressure in socket. Move your weight back onto your heels, HOLD/FEEL  pressure in socket. Move your weight back to equal on both legs, HOLD/FEEL  pressure in socket. Repeat.  Start with both hands on sink, progress to hand on prosthetic side only, then no hands.  Moving Cones / Cups: With equal weight on each leg: Hold on with one hand the first time, then progress to no hand supports. Move cups from one side of sink to the other. Place cups ~2" out of your reach, progress to 10" beyond reach.  Place one hand in middle of sink and reach with other hand. Do both arms.  Then hover one hand and move cups with other hand.  Overhead/Upward Reaching: alternated  reaching up to top cabinets or ceiling if no cabinets present. Keep equal weight on each leg. Start with one hand support on counter while other hand reaches and progress to no hand support with reaching.  ace one hand in middle of sink and reach with other hand. Do both arms.  Then hover one hand and move cups with other hand.  5.   Looking Over Shoulders: With equal weight on each leg: alternate turning to look over your shoulders with one hand support on counter as needed.  Start with head motions only to look in front of shoulder, then even with shoulder and progress to looking behind you. To look to side, move head /eyes, then shoulder on side looking pulls back, shift more weight to side looking and pull hip back. Place one hand in middle of sink and let go with other hand so your shoulder can pull back. Switch hands to look other way.   Then hover one hand and  look over shoulder. If looking right, use left hand at sink. If looking left, use right hand at sink. 6.  Stepping with leg that is not amputated:  Move items under cabinet out of your way. Shift your hips/pelvis so weight on prosthesis. Tighten muscles in hip on prosthetic side.  SLOWLY step other leg so front of foot is in cabinet. Then step back to floor.   ASSESSMENT:  CLINICAL IMPRESSION: PT educated patient and wife on use of barstool to improve standing ADLs.  Also use of cane with counter support to improve activities and confined areas where the rolling walker is too bulky.  Patient appears to understand both.  Patient continues to benefit from skilled physical therapy.    OBJECTIVE IMPAIRMENTS: Abnormal gait, cardiopulmonary status limiting activity, decreased activity tolerance, decreased balance, decreased endurance, decreased knowledge of condition, decreased knowledge of use of DME, decreased mobility, difficulty walking, decreased ROM, decreased strength, increased edema, prosthetic dependency , and pain.   ACTIVITY LIMITATIONS: carrying, lifting, bending, standing, stairs, transfers, and locomotion level  PARTICIPATION LIMITATIONS: meal prep, cleaning, driving, community activity, and yard work  PERSONAL FACTORS: Fitness, Time since onset of injury/illness/exacerbation, and 3+ comorbidities: see PMH  are also affecting patient's functional outcome.   REHAB POTENTIAL: Good  CLINICAL DECISION MAKING: Evolving/moderate complexity  EVALUATION COMPLEXITY: Moderate   GOALS: Goals reviewed with patient? Yes  SHORT TERM GOALS: Target date: 07/02/2023  Patient donnes prosthesis modified independent & verbalizes proper cleaning. Baseline: SEE OBJECTIVE DATA Goal status:   MET 06/30/2023 2.  Patient tolerates prosthesis >6 hrs total /day without skin issues or limb pain >5/10 after standing. Baseline: SEE OBJECTIVE DATA Goal status: Partially MET 06/30/2023  3.  Patient able to  reach 5" and looks to sides without UE support with supervision. Baseline: SEE OBJECTIVE DATA Goal status: MET 07/07/2023  4. Patient ambulates 56' with RW & prosthesis with supervision. Baseline: SEE OBJECTIVE DATA Goal status: MET 07/07/2023  5. Patient negotiates ramps & curbs with RW & prosthesis with minA. Baseline: SEE OBJECTIVE DATA Goal status: MET 06/23/2023  LONG TERM GOALS: Target date: 08/28/2023  Patient demonstrates & verbalized understanding of prosthetic care to enable safe utilization of prosthesis. Baseline: SEE OBJECTIVE DATA Goal status:   Ongoing 07/28/2023  Patient tolerates prosthesis wear >90% of awake hours without skin or limb pain issues. Baseline: SEE OBJECTIVE DATA Goal status: Ongoing 07/28/2023  Berg Balance >36/56 to indicate lower fall risk Baseline: SEE OBJECTIVE DATA Goal status: Ongoing 07/28/2023  Patient ambulates >  300' with prosthesis & LRAD independently Baseline: SEE OBJECTIVE DATA Goal status: Ongoing 07/28/2023  Patient negotiates ramps, curbs & stairs with single rail with prosthesis & LRAD independently. Baseline: SEE OBJECTIVE DATA Goal status: Ongoing 07/28/2023   PLAN:  PT FREQUENCY: 2x/week  PT DURATION: 12 weeks  PLANNED INTERVENTIONS: 97164- PT Re-evaluation, 97110-Therapeutic exercises, 97530- Therapeutic activity, 97112- Neuromuscular re-education, [phone]- Self Care, 60454- Gait training, [phone]- Prosthetic training, Patient/Family education, Balance training, Stair training, Scar mobilization, Vestibular training, DME instructions, and Physical Performance Testing  PLAN FOR NEXT SESSION:  Continue ambulation with SBQC, continue to progress ambulation tolerance as patient is fit, standing balance activities.    Date of referral: 04/17/2023  PT eval delayed by COPD exacerbation Referring provider: Aldean Baker, MD Referring diagnosis? Z89.511 (ICD-10-CM) - S/P BKA (below knee amputation), right Treatment diagnosis? (if  different than referring diagnosis)   Other abnormalities of gait & mobility R26.89 Unsteadiness on feet R26.81 Pain in left leg M79.605 Pain in right leg M79.604 Muscle Weakness (generalized) M62.81 History of falling Z91.81 Impaired functional mobility, balance, gait and endurance  What was this (referring dx) caused by? Surgery (Type: right BKA)  Nature of Condition: Initial Onset (within last 3 months)  prosthesis delivery 04/01/2023   Laterality: Rt  Current Functional Measure Score: Other Berg Balance 19/56  Objective measurements identify impairments when they are compared to normal values, the uninvolved extremity, and prior level of function.  [x]  Yes  []  No  Objective assessment of functional ability: Severe functional limitations   Briefly describe symptoms: dependent in care & use of prosthesis, impaired balance and gait, pain in both legs  How did symptoms start: amputation from wounds  Average pain intensity:  Last 24 hours: 8/10  Past week: 8/10  How often does the pt experience symptoms? Constantly  How much have the symptoms interfered with usual daily activities? Extremely  How has condition changed since care began at this facility? NA - initial visit  In general, how is the patients overall health? Good   BACK PAIN (STarT Back Screening Tool) No   Vladimir Faster, PT, DPT 07/30/2023, 4:29 PM

## 2023-08-04 ENCOUNTER — Encounter: Payer: Self-pay | Admitting: Physical Therapy

## 2023-08-04 ENCOUNTER — Ambulatory Visit: Admitting: Physical Therapy

## 2023-08-04 DIAGNOSIS — R2689 Other abnormalities of gait and mobility: Secondary | ICD-10-CM

## 2023-08-04 DIAGNOSIS — M79604 Pain in right leg: Secondary | ICD-10-CM

## 2023-08-04 DIAGNOSIS — R2681 Unsteadiness on feet: Secondary | ICD-10-CM

## 2023-08-04 DIAGNOSIS — Z9181 History of falling: Secondary | ICD-10-CM

## 2023-08-04 DIAGNOSIS — M79605 Pain in left leg: Secondary | ICD-10-CM | POA: Diagnosis not present

## 2023-08-04 DIAGNOSIS — Z7409 Other reduced mobility: Secondary | ICD-10-CM

## 2023-08-04 DIAGNOSIS — M6281 Muscle weakness (generalized): Secondary | ICD-10-CM

## 2023-08-04 NOTE — Therapy (Signed)
 OUTPATIENT PHYSICAL THERAPY PROSTHETIC TREATMENT   Patient Name: [name] MRN: [id number] DOB:1950-09-09, 73 y.o., male Today's Date: 08/04/2023  END OF SESSION:  PT End of Session - 08/04/23 1017     Visit Number 13    Number of Visits 25    Date for PT Re-Evaluation 08/28/23    Authorization Type [employer] Medicare    Authorization Time Period [employer] MEDICARE $20 COPAY 16 PT VISITS APPROVED 2/4-5/27/25    Authorization - Visit Number 13    Authorization - Number of Visits 16    Progress Note Due on Visit 20    PT Start Time 1017    PT Stop Time 1058    PT Time Calculation (min) 41 min    Equipment Utilized During Treatment Gait belt    Activity Tolerance Patient tolerated treatment well;Patient limited by pain;Patient limited by fatigue    Behavior During Therapy [facility] for tasks assessed/performed                      Past Medical History:  Diagnosis Date   Aortic stenosis, mild    Arthritis of hand    "just a little bit in both hands" (03/31/2012)   Asthma    "little bit" (03/31/2012)   Atrial fibrillation (HCC)    dx '04; DCCV '04, placed on flecainide, failed DCCV 04/2010, flecainide stopped; s/p successful A.fib ablation 01/31/12   CHF (congestive heart failure) (HCC)    Controlled type 2 diabetes mellitus without complication, without long-term current use of insulin (HCC) 12/13/2019   COPD (chronic obstructive pulmonary disease) (HCC)    DJD (degenerative joint disease)    Fatty liver    Fibromyalgia    GERD (gastroesophageal reflux disease)    Hyperlipidemia    Hypertension    Hypothyroidism    S/P radiation   Left atrial enlargement    LA size 45mm by echo 11/21/11   Mitral regurgitation    trivial   Obstructive sleep apnea    mild by sleep study 2013; pt stated he does not have a machine because "it wasn't bad enough for him to have one"   Pacemaker 03/31/2012   Pneumonia    last time 2023   Restless leg syndrome    Second degree AV block     Splenomegaly    Past Surgical History:  Procedure Laterality Date   AMPUTATION Right 08/23/2022   Procedure: RIGHT FOOT FIFTH RAY AMPUTATION;  Surgeon: [name], MD;  Location: [facility] OR;  Service: Orthopedics;  Laterality: Right;   AMPUTATION Right 09/04/2022   Procedure: RIGHT BELOW KNEE AMPUTATION;  Surgeon: [name], MD;  Location: [facility] OR;  Service: Orthopedics;  Laterality: Right;   AORTIC VALVE REPLACEMENT N/A 08/22/2015   Procedure: AORTIC VALVE REPLACEMENT (AVR);  Surgeon: Kerin Perna, MD;  Location: [facility] OR;  Service: Open Heart Surgery;  Laterality: N/A;   ATRIAL FIBRILLATION ABLATION  01/30/2012   PVI by Dr. Johney Frame   ATRIAL FIBRILLATION ABLATION N/A 01/31/2012   Procedure: ATRIAL FIBRILLATION ABLATION;  Surgeon: [name], MD;  Location: [facility] CATH LAB;  Service: Cardiovascular;  Laterality: N/A;   ATRIAL FIBRILLATION ABLATION N/A 04/19/2020   Procedure: ATRIAL FIBRILLATION ABLATION;  Surgeon: [name], MD;  Location: [facility] INVASIVE CV LAB;  Service: Cardiovascular;  Laterality: N/A;   BACK SURGERY     X 3   BIOPSY  01/01/2022   Procedure: BIOPSY;  Surgeon: Kathi Der, MD;  Location: [facility] ENDOSCOPY;  Service: Gastroenterology;;   CARDIAC CATHETERIZATION N/A 08/09/2015   Procedure: Right/Left Heart Cath and Coronary Angiography;  Surgeon: Peter M Swaziland, MD;  Location: [facility] INVASIVE CV LAB;  Service: Cardiovascular;  Laterality: N/A;   CARDIAC CATHETERIZATION N/A 02/05/2016   Procedure: Right/Left Heart Cath and Coronary Angiography;  Surgeon: Corky Crafts, MD;  Location: [facility] INVASIVE CV LAB;  Service: Cardiovascular;  Laterality: N/A;   CARDIAC CATHETERIZATION N/A 04/17/2016   Procedure: Right Heart Cath;  Surgeon: Dolores Patty, MD;  Location: [facility] INVASIVE CV LAB;  Service: Cardiovascular;  Laterality: N/A;   CARDIOVASCULAR STRESS TEST  03/12/2002   EF 48%, NO EVIDENCE OF ISCHEMIA   CARDIOVERSION  01/2012; 03/31/2012   CARDIOVERSION N/A 02/25/2012    Procedure: CARDIOVERSION;  Surgeon: [name], MD;  Location: [facility] CATH LAB;  Service: Cardiovascular;  Laterality: N/A;   CARDIOVERSION N/A 03/31/2012   Procedure: CARDIOVERSION;  Surgeon: [name], MD;  Location: [facility] CATH LAB;  Service: Cardiovascular;  Laterality: N/A;   CARDIOVERSION N/A 11/23/2015   Procedure: CARDIOVERSION;  Surgeon: Laurey Morale, MD;  Location: [facility] ENDOSCOPY;  Service: Cardiovascular;  Laterality: N/A;   CARDIOVERSION N/A 04/08/2016   Procedure: CARDIOVERSION;  Surgeon: Dolores Patty, MD;  Location: [facility] ENDOSCOPY;  Service: Cardiovascular;  Laterality: N/A;   CARDIOVERSION N/A 09/14/2018   Procedure: CARDIOVERSION;  Surgeon: Wendall Stade, MD;  Location: [facility] ENDOSCOPY;  Service: Cardiovascular;  Laterality: N/A;   CARDIOVERSION N/A 10/19/2019   Procedure: CARDIOVERSION;  Surgeon: Dolores Patty, MD;  Location: [facility] ENDOSCOPY;  Service: Cardiovascular;  Laterality: N/A;   CARDIOVERSION N/A 02/25/2020   Procedure: CARDIOVERSION;  Surgeon: Pricilla Riffle, MD;  Location: [facility] ENDOSCOPY;  Service: Cardiovascular;  Laterality: N/A;   CARDIOVERSION N/A 05/25/2020   Procedure: CARDIOVERSION;  Surgeon: Lewayne Bunting, MD;  Location: [facility] ENDOSCOPY;  Service: Cardiovascular;  Laterality: N/A;   CARDIOVERSION N/A 07/10/2020   Procedure: CARDIOVERSION;  Surgeon: Jake Bathe, MD;  Location: [facility] ENDOSCOPY;  Service: Cardiovascular;  Laterality: N/A;   CLIPPING OF ATRIAL APPENDAGE N/A 08/22/2015   Procedure: CLIPPING OF ATRIAL APPENDAGE;  Surgeon: Kerin Perna, MD;  Location: [facility] OR;  Service: Open Heart Surgery;  Laterality: N/A;   COLONOSCOPY WITH PROPOFOL N/A 01/01/2022   Procedure: COLONOSCOPY WITH PROPOFOL;  Surgeon: Kathi Der, MD;  Location: [facility] ENDOSCOPY;  Service: Gastroenterology;  Laterality: N/A;   DOPPLER ECHOCARDIOGRAPHY  03/11/2002   EF 70-75%   FINGER SURGERY Left    Middle finger   FINGER TENDON REPAIR  1980's   "right little finger" (03/31/2012)    FLEXIBLE SIGMOIDOSCOPY N/A 12/30/2021   Procedure: FLEXIBLE SIGMOIDOSCOPY;  Surgeon: Charlott Rakes, MD;  Location: [facility] ENDOSCOPY;  Service: Gastroenterology;  Laterality: N/A;   INSERT / REPLACE / REMOVE PACEMAKER     St Jude   IR RADIOLOGIST EVAL & MGMT  05/06/2019   IR RADIOLOGIST EVAL & MGMT  10/06/2019   IR RADIOLOGIST EVAL & MGMT  10/31/2020   IR RADIOLOGIST EVAL & MGMT  01/18/2021   IR RADIOLOGIST EVAL & MGMT  03/08/2021   IR RADIOLOGIST EVAL & MGMT  06/13/2021   KIDNEY SURGERY Left    LEFT AND RIGHT HEART CATHETERIZATION WITH CORONARY ANGIOGRAM N/A 10/27/2012   Procedure: LEFT AND RIGHT HEART CATHETERIZATION WITH CORONARY ANGIOGRAM;  Surgeon: Pamella Pert, MD;  Location: [facility] CATH LAB;  Service: Cardiovascular;  Laterality: N/A;   LUMBAR DISC SURGERY  1980's X2;  2000's   MAZE N/A 08/22/2015   Procedure: MAZE;  Surgeon: Kerin Perna, MD;  Location: [facility] OR;  Service: Open Heart Surgery;  Laterality: N/A;   PACEMAKER INSERTION  03/31/2012   STJ Accent DR pacemaker implanted by Dr Johney Frame   PERMANENT PACEMAKER INSERTION N/A 03/31/2012   Procedure: PERMANENT PACEMAKER INSERTION;  Surgeon: [name], MD;  Location: [facility] CATH LAB;  Service: Cardiovascular;  Laterality: N/A;   PPM GENERATOR CHANGEOUT N/A 08/08/2020   Procedure: PPM GENERATOR CHANGEOUT;  Surgeon: [name], MD;  Location: [facility] INVASIVE CV LAB;  Service: Cardiovascular;  Laterality: N/A;   RADIOLOGY WITH ANESTHESIA Left 02/14/2021   Procedure: RADIOLOGY WITH ANESTHESIA LEFT RENAL CRYOABLATION;  Surgeon: Irish Lack, MD;  Location: [facility] ORS;  Service: Radiology;  Laterality: Left;   STUMP REVISION Right 10/09/2022   Procedure: REVISION RIGHT BELOW KNEE AMPUTATION;  Surgeon: [name], MD;  Location: [facility] OR;  Service: Orthopedics;  Laterality: Right;   STUMP REVISION Right 11/16/2022   Procedure: REVISION RIGHT BELOW KNEE AMPUTATION;  Surgeon: [name], MD;  Location: [facility] OR;  Service: Orthopedics;   Laterality: Right;   TEE WITHOUT CARDIOVERSION  01/30/2012   Procedure: TRANSESOPHAGEAL ECHOCARDIOGRAM (TEE);  Surgeon: Laurey Morale, MD;  Location: [facility] ENDOSCOPY;  Service: Cardiovascular;  Laterality: N/A;  ablation next day   TEE WITHOUT CARDIOVERSION N/A 08/22/2015   Procedure: TRANSESOPHAGEAL ECHOCARDIOGRAM (TEE);  Surgeon: Kerin Perna, MD;  Location: [facility] OR;  Service: Open Heart Surgery;  Laterality: N/A;   TEE WITHOUT CARDIOVERSION N/A 10/19/2019   Procedure: TRANSESOPHAGEAL ECHOCARDIOGRAM (TEE);  Surgeon: Dolores Patty, MD;  Location: [facility] ENDOSCOPY;  Service: Cardiovascular;  Laterality: N/A;   US ECHOCARDIOGRAPHY  08/07/2009   EF 55-60%   Patient Active Problem List   Diagnosis Date Noted   AKI (acute kidney injury) (HCC) 12/22/2022   Fall 11/14/2022   Pain of right lower extremity due to injury 11/14/2022   Diabetic ulcer of lower leg (HCC) 11/13/2022   Osteomyelitis (HCC) 11/13/2022   Dyslipidemia 10/18/2022   Cellulitis of right lower extremity 10/17/2022   Dehiscence of amputation stump of right lower extremity (HCC) 10/09/2022   Below-knee amputation of right lower extremity (HCC) 10/09/2022   Gangrene of right foot (HCC) 09/04/2022   History of below-knee amputation of right lower extremity (HCC) 09/04/2022   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) 08/23/2022   CAP (community acquired pneumonia) 12/28/2021   Constipation 12/28/2021   Pain in both lower extremities 07/03/2021   Peripheral neuropathy 07/03/2021   Acute metabolic encephalopathy 06/09/2021   Marijuana dependence (HCC) 06/09/2021   DNR (do not resuscitate) 06/09/2021   Chest pain 06/08/2021   Renal lesion 02/14/2021   Status post cryoablation 02/14/2021   Allergic rhinitis 12/08/2020   Preop pulmonary/respiratory exam 03/27/2020   Acute on chronic diastolic (congestive) heart failure (HCC) 01/22/2020   Abdominal pain 01/22/2020   Hypothyroidism 12/13/2019   DM2 (diabetes mellitus,  type 2) (HCC) 12/13/2019   Chronic kidney disease, stage 3b (HCC) 12/13/2019   Acute bacterial bronchitis 12/13/2019   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   Typical atrial flutter (HCC)    Chronic respiratory failure with hypoxia (HCC) 05/23/2016   CAD (coronary artery disease) 05/23/2016   Persistent atrial fibrillation (HCC)    Atypical atrial flutter (HCC)    Visit for monitoring Tikosyn therapy 04/16/2016   Chronic diastolic CHF (congestive heart failure) (HCC) 02/03/2016   Cough 12/06/2015   Acute on chronic respiratory failure with hypoxia (HCC) 09/08/2015   Hallucinations 09/04/2015   Lymphadenopathy 09/04/2015  Ascending aortic aneurysm (HCC) 09/04/2015   Hypoxia 09/02/2015   S/P aortic valve replacement with bioprosthetic valve 09/02/2015   Pleural effusion 09/02/2015   S/P AVR 08/22/2015   GERD without esophagitis 09/20/2014   Intrinsic asthma 10/13/2013   Dyspnea 09/15/2013   Pulmonary hypertension (HCC) 09/15/2013   Shortness of breath 05/05/2013   Pacemaker-St.Jude 04/01/2012   Bradycardia 03/31/2012   Second degree AV block 03/31/2012   AV block, 1st degree 02/01/2012   PAF (paroxysmal atrial fibrillation) (HCC) 11/13/2011   Fatigue 11/13/2011   Essential hypertension 11/13/2011   Aortic stenosis 11/13/2011   Sleep apnea 11/13/2011    PCP: Alysia Penna, MD  REFERRING PROVIDER: Aldean Baker, MD  ONSET DATE: 04/01/2023 prosthesis delivery  REFERRING DIAG: Z89.511 (ICD-10-CM) - S/P BKA (below knee amputation), right   THERAPY DIAG:  Other abnormalities of gait and mobility  Unsteadiness on feet  Pain in right leg  Pain in left leg  Muscle weakness (generalized)  Impaired functional mobility, balance, gait, and endurance  History of falling  Rationale for Evaluation and Treatment: Rehabilitation  SUBJECTIVE:   SUBJECTIVE STATEMENT: The weather is causing more neuropathy today.    PERTINENT HISTORY:  right TTA 09/04/22, DM2, HLD, asthma, COPD,  A-Fib, CAD, pacemaker, cardioversion & catheterization, arthritis, Fibromyalgia, GERD, HTN,   PAIN:  Are you having pain? Yes: NPRS scale:  today   10/10 Right leg, 10/10 left leg  and in last week down lower both legs Pain location: both legs Pain description: neuropathy, sharp, sticking "big" needles in leg Aggravating factors: meds wearing off,  standing & walking Relieving factors: pain pill 2x/day  Residual limb prosthesis on limb 0/10 - 10/10,  stand or walk 5/10 - 10/10  PRECAUTIONS: Fall and ICD/Pacemaker  WEIGHT BEARING RESTRICTIONS: No  FALLS: Has patient fallen in last 6 months? Yes. Number of falls 1 no injuries  LIVING ENVIRONMENT: Lives with: lives with their spouse and 3 dogs (mini schnauzer 15# indoor & 2 german Slater outdoor)  Lives in: [place] [facility] Access: Ramped entrance Home layout: One level Stairs: Yes: External: 6 steps; can reach both ramp at primary entrance Has following equipment at home: Single point cane, Environmental consultant - 2 wheeled, Wheelchair (manual), shower chair, Tour manager, and Ramped entry  OCCUPATION: retired from [employer]  PLOF: Independent, Independent with household mobility without device, and Independent with community mobility without device used oxygen as needed  PATIENT GOALS:  use prosthesis to function in community & in house, do odd repairs, yard work  OBJECTIVE:  COGNITION: Overall cognitive status: Within functional limits for tasks assessed   SENSATION: WFL  CARDIOVASCULAR RESPONSE: Functional activity: balance and gait assessments Pre-activity vitals: no oxygen seated in w/c HR: 67 SpO2: 93 Post-activity vitals: Berg with no oxygen  HR: 78 SpO2: 90-91%  94% on oxygen at rest, HR 70 After gait with O2 SpO2 94%, HR 73  Modified Borg scale for dyspnea: 2: mild shortness of breath  POSTURE: rounded shoulders, forward head, flexed trunk , and weight shift left  LOWER EXTREMITY ROM:  ROM P:passive  A:active  Right eval  Hip flexion   Hip extension standing -10*   Hip abduction   Hip adduction   Hip internal rotation   Hip external rotation   Knee flexion   Knee extension   Ankle dorsiflexion   Ankle plantarflexion   Ankle inversion   Ankle eversion    (Blank rows = not tested)  LOWER EXTREMITY MMT:  MMT Right eval Left eval  Hip flexion    Hip extension 3-4/5 3-4/5  Hip abduction 3-4/5 4/5  Hip adduction    Hip internal rotation    Hip external rotation    Knee flexion 3-4/5 4/5  Knee extension 3-4/5 4/5  Ankle dorsiflexion    Ankle plantarflexion    Ankle inversion    Ankle eversion    At Evaluation all strength testing is grossly seated and functionally standing / gait. (Blank rows = not tested)  TRANSFERS: Evaluation on 06/03/2023:    Sit to stand: SBA from 20" w/c using BUEs on armrests to arise stabilizes with back of legs against w/c Stand to sit: SBA uses BUEs on armrests of w/c to control descent  FUNCTIONAL TESTs:  Evaluation on 06/03/2023:   Berg Balance Scale: 19/56  GAIT: Evaluation on 06/03/2023:    Gait pattern: step to pattern, decreased step length- Left, decreased stance time- Right, decreased hip/knee flexion- Right, decreased ankle dorsiflexion- Right, Right hip hike, knee flexed in stance- Right, lateral hip instability, trunk flexed, and abducted- Right Distance walked: 25' Assistive device utilized: Environmental consultant - 2 wheeled and TTA prosthesis Level of assistance: Min A balance loss to left on straight path & 180* turn Comments: used O2 with portable unit slung over left shoulder,  pain in residual limb limited distance,   CURRENT PROSTHETIC WEAR ASSESSMENT: Evaluation on 06/03/2023:    Patient is dependent with: skin check, residual limb care, care of non-amputated limb, prosthetic cleaning, ply sock cleaning, correct ply sock adjustment, proper wear schedule/adjustment, and proper weight-bearing schedule/adjustment Donning prosthesis: dependent with PT  100% cues on technique Doffing prosthesis: reports modified independent Prosthetic wear tolerance: 2-4 hours, 1x/day, ~3 days/week Prosthetic weight bearing tolerance: 5 minutes with partial weight on prosthesis with pain increasing to 8/10 (intolerable) Edema: pitting with 7 sec capillary refill Residual limb condition: dry skin especially distal limb, 3 scratches with superficial scab on distal limb, scar healed but adhered over entire area of distal tibia, bulbous shape,  Prosthetic description: silicon liner with pin lock suspension, total contact socket with flexible inner socket, dynamic response foot K code/activity level with prosthetic use: Level 3    TODAY'S TREATMENT:                                                                                                                             DATE:  08/04/2023: Prosthetic Care with TTA prosthesis: Patient ambulated in and out of clinic with rollator walker safely.  PT did give verbal cues on upright posture. Patient ambulated 30' x 2 with [facility] and min assistance.  PT demo and verbal cues on proper location of the cane and a slight step through pattern with LLE.  Patient requires HHA about halfway through both walks due to lower extremity fatigue.  Patient and wife verbalize how to properly give HHA as needed when walking. Pt & wife verbalize understanding of providing HHA.   Self-care: Patient reports that his neck position especially  while sitting and sleeping (sleeps in recliner) is beginning to limit his upper extremity strength and lower extremity strength.  PT demo scapular depression as he sits with exaggerated elevation which will trigger upper trapezius pain.  Patient return demonstration scapular depression exercises.  PT recommended positioning when sitting so that arm weight is supported by the armrest.  PT also discussed that soft collar & soft strap at forehead at night to prevent cervical flexion when sleeping may help. Patient  and wife verbalized understanding.      TREATMENT:                                                                                                                             DATE:  07/30/2023: Prosthetic Care with TTA prosthesis: PT educated patient on use of barstool for ADLs that he typically would stand.  PT demoed sit to and from stand from a barstool.  Patient able to perform sit to/from stand from 24 inch and 29".  Patient was tolerant of that he could be seated upright with feet planted on floor for balance on either height.  Standard chairs are 18 inches tall.  Use of a barstool when increases height by 6 or 11 inches enabling upper extremity function and vision improved at counter heights.  Also he has the ability to balance better not requiring upper extremity assist for support.  Patient verbalized understanding and reports he believes it would be of benefit for activities in his workshop, kitchen and bathroom. Patient ambulated 25 feet with table support RUE and cane LUE.  1 rep with standard cane with a stand-alone tip and 1 rep with small base SBQC.  Patient was safe performing this activity with upper extremity support on counter rail or table opposite of the cane.  PT described the difference between these 2 canes and also LBQC.  Patient reports she was more comfortable with the [facility] and feels like it would be beneficial for trying to do some activities and his workshop in the kitchen.    TREATMENT:                                                                                                                             DATE:  07/28/2023: Prosthetic Care with TTA prosthesis: PT demo & verbal cues on picking up items from floor.  Pt able to safely return demo with support on seat of locked rollator  walker.   Pt amb 25' X 2 with cane & HHA maxA 1st time for balance issues and min to modA 2nd.  PT demo & verbal cues on step through pattern and weight shift over prosthesis in stance which  improved balance.   PT educated on fall risk when prosthesis is off, using prosthesis to enter/exit for shower to tub bench and keeping w/c close to bed at night.  PT also demo & verbal cues on using DryPro bag for shower &/or pool.  Pt and wife verbalized understanding.     PATIENT EDUCATION: PATIENT EDUCATED ON FOLLOWING PROSTHETIC CARE: Education details:  Skin check, Residual limb care, Prosthetic cleaning, Correct ply sock adjustment, Propper donning, and Proper wear schedule/adjustment Prosthetic wear tolerance: 2 hours 2x/day, 7 days/week Person educated: Patient and Spouse Education method: Explanation, Demonstration, Tactile cues, and Verbal cues Education comprehension: verbalized understanding, verbal cues required, tactile cues required, and needs further education  HOME EXERCISE PROGRAM: Printed and issued 06/25/23 Do each exercise 1-2  times per day Do each exercise 5-10 repetitions Hold each exercise for 2 seconds to feel your location  AT SINK FIND YOUR MIDLINE POSITION AND PLACE FEET EQUAL DISTANCE FROM THE MIDLINE.  Try to find this position when standing still for activities.   USE TAPE ON FLOOR TO MARK THE MIDLINE POSITION which is even with middle of sink.  You also should try to feel with your limb pressure in socket.  You are trying to feel with limb what you used to feel with the bottom of your foot.  Side to Side Shift: Moving your hips only (not shoulders): move weight onto your left leg, HOLD/FEEL pressure in socket.  Move back to equal weight on each leg, HOLD/FEEL pressure in socket. Move weight onto your right leg, HOLD/FEEL pressure in socket. Move back to equal weight on each leg, HOLD/FEEL pressure in socket. Repeat.  Start with both hands on sink, progress to hand on prosthetic side only, then no hands.  Front to Back Shift: Moving your hips only (not shoulders): move your weight forward onto your toes, HOLD/FEEL pressure in socket. Move your weight back to  equal Flat Foot on both legs, HOLD/FEEL  pressure in socket. Move your weight back onto your heels, HOLD/FEEL  pressure in socket. Move your weight back to equal on both legs, HOLD/FEEL  pressure in socket. Repeat.  Start with both hands on sink, progress to hand on prosthetic side only, then no hands.  Moving Cones / Cups: With equal weight on each leg: Hold on with one hand the first time, then progress to no hand supports. Move cups from one side of sink to the other. Place cups ~2" out of your reach, progress to 10" beyond reach.  Place one hand in middle of sink and reach with other hand. Do both arms.  Then hover one hand and move cups with other hand.  Overhead/Upward Reaching: alternated reaching up to top cabinets or ceiling if no cabinets present. Keep equal weight on each leg. Start with one hand support on counter while other hand reaches and progress to no hand support with reaching.  ace one hand in middle of sink and reach with other hand. Do both arms.  Then hover one hand and move cups with other hand.  5.   Looking Over Shoulders: With equal weight on each leg: alternate turning to look over your shoulders with one hand support on counter as needed.  Start with head motions only  to look in front of shoulder, then even with shoulder and progress to looking behind you. To look to side, move head /eyes, then shoulder on side looking pulls back, shift more weight to side looking and pull hip back. Place one hand in middle of sink and let go with other hand so your shoulder can pull back. Switch hands to look other way.   Then hover one hand and look over shoulder. If looking right, use left hand at sink. If looking left, use right hand at sink. 6.  Stepping with leg that is not amputated:  Move items under cabinet out of your way. Shift your hips/pelvis so weight on prosthesis. Tighten muscles in hip on prosthetic side.  SLOWLY step other leg so front of foot is in cabinet. Then step back to floor.    ASSESSMENT:  CLINICAL IMPRESSION: Patient improved gait with [facility] with instruction but needs more work for safety.  He appears to understand PT recommendations for positioning in recliner.   Patient continues to benefit from skilled physical therapy.    OBJECTIVE IMPAIRMENTS: Abnormal gait, cardiopulmonary status limiting activity, decreased activity tolerance, decreased balance, decreased endurance, decreased knowledge of condition, decreased knowledge of use of DME, decreased mobility, difficulty walking, decreased ROM, decreased strength, increased edema, prosthetic dependency , and pain.   ACTIVITY LIMITATIONS: carrying, lifting, bending, standing, stairs, transfers, and locomotion level  PARTICIPATION LIMITATIONS: meal prep, cleaning, driving, community activity, and yard work  PERSONAL FACTORS: Fitness, Time since onset of injury/illness/exacerbation, and 3+ comorbidities: see PMH  are also affecting patient's functional outcome.   REHAB POTENTIAL: Good  CLINICAL DECISION MAKING: Evolving/moderate complexity  EVALUATION COMPLEXITY: Moderate   GOALS: Goals reviewed with patient? Yes  SHORT TERM GOALS: Target date: 07/02/2023  Patient donnes prosthesis modified independent & verbalizes proper cleaning. Baseline: SEE OBJECTIVE DATA Goal status:   MET 06/30/2023 2.  Patient tolerates prosthesis >6 hrs total /day without skin issues or limb pain >5/10 after standing. Baseline: SEE OBJECTIVE DATA Goal status: Partially MET 06/30/2023  3.  Patient able to reach 5" and looks to sides without UE support with supervision. Baseline: SEE OBJECTIVE DATA Goal status: MET 07/07/2023  4. Patient ambulates 65' with RW & prosthesis with supervision. Baseline: SEE OBJECTIVE DATA Goal status: MET 07/07/2023  5. Patient negotiates ramps & curbs with RW & prosthesis with minA. Baseline: SEE OBJECTIVE DATA Goal status: MET 06/23/2023  LONG TERM GOALS: Target date: 08/28/2023  Patient  demonstrates & verbalized understanding of prosthetic care to enable safe utilization of prosthesis. Baseline: SEE OBJECTIVE DATA Goal status:   Ongoing   08/04/2023  Patient tolerates prosthesis wear >90% of awake hours without skin or limb pain issues. Baseline: SEE OBJECTIVE DATA Goal status: Ongoing   08/04/2023  Berg Balance >36/56 to indicate lower fall risk Baseline: SEE OBJECTIVE DATA Goal status: Ongoing   08/04/2023  Patient ambulates >300' with prosthesis & LRAD independently Baseline: SEE OBJECTIVE DATA Goal status: Ongoing   08/04/2023  Patient negotiates ramps, curbs & stairs with single rail with prosthesis & LRAD independently. Baseline: SEE OBJECTIVE DATA Goal status: Ongoing  08/04/2023   PLAN:  PT FREQUENCY: 2x/week  PT DURATION: 12 weeks  PLANNED INTERVENTIONS: 97164- PT Re-evaluation, 97110-Therapeutic exercises, 97530- Therapeutic activity, 97112- Neuromuscular re-education, [phone]- Self Care, 47425- Gait training, [phone]- Prosthetic training, Patient/Family education, Balance training, Stair training, Scar mobilization, Vestibular training, DME instructions, and Physical Performance Testing  PLAN FOR NEXT SESSION:  Check if positioning in recliner helped pain,  Continue  ambulation with SBQC, continue to progress ambulation tolerance as patient is fit, standing balance activities.    Date of referral: 04/17/2023  PT eval delayed by COPD exacerbation Referring provider: Aldean Baker, MD Referring diagnosis? Z89.511 (ICD-10-CM) - S/P BKA (below knee amputation), right Treatment diagnosis? (if different than referring diagnosis)   Other abnormalities of gait & mobility R26.89 Unsteadiness on feet R26.81 Pain in left leg M79.605 Pain in right leg M79.604 Muscle Weakness (generalized) M62.81 History of falling Z91.81 Impaired functional mobility, balance, gait and endurance  What was this (referring dx) caused by? Surgery (Type: right BKA)  Nature of Condition:  Initial Onset (within last 3 months)  prosthesis delivery 04/01/2023   Laterality: Rt  Current Functional Measure Score: Other Berg Balance 19/56  Objective measurements identify impairments when they are compared to normal values, the uninvolved extremity, and prior level of function.  [x]  Yes  []  No  Objective assessment of functional ability: Severe functional limitations   Briefly describe symptoms: dependent in care & use of prosthesis, impaired balance and gait, pain in both legs  How did symptoms start: amputation from wounds  Average pain intensity:  Last 24 hours: 8/10  Past week: 8/10  How often does the pt experience symptoms? Constantly  How much have the symptoms interfered with usual daily activities? Extremely  How has condition changed since care began at this facility? NA - initial visit  In general, how is the patients overall health? Good   BACK PAIN (STarT Back Screening Tool) No   Vladimir Faster, PT, DPT 08/04/2023, 2:46 PM

## 2023-08-07 ENCOUNTER — Encounter: Payer: Self-pay | Admitting: Physical Therapy

## 2023-08-07 ENCOUNTER — Ambulatory Visit: Admitting: Physical Therapy

## 2023-08-07 ENCOUNTER — Ambulatory Visit: Payer: Medicare Other

## 2023-08-07 DIAGNOSIS — R2689 Other abnormalities of gait and mobility: Secondary | ICD-10-CM

## 2023-08-07 DIAGNOSIS — Z7409 Other reduced mobility: Secondary | ICD-10-CM

## 2023-08-07 DIAGNOSIS — I442 Atrioventricular block, complete: Secondary | ICD-10-CM

## 2023-08-07 DIAGNOSIS — R2681 Unsteadiness on feet: Secondary | ICD-10-CM

## 2023-08-07 DIAGNOSIS — M79604 Pain in right leg: Secondary | ICD-10-CM | POA: Diagnosis not present

## 2023-08-07 DIAGNOSIS — M79605 Pain in left leg: Secondary | ICD-10-CM

## 2023-08-07 DIAGNOSIS — M6281 Muscle weakness (generalized): Secondary | ICD-10-CM

## 2023-08-07 NOTE — Therapy (Signed)
 OUTPATIENT PHYSICAL THERAPY PROSTHETIC TREATMENT   Patient Name: [name] MRN: [id number] DOB:03-Jan-1951, 73 y.o., male Today's Date: 08/07/2023  END OF SESSION:  PT End of Session - 08/07/23 1145     Visit Number 14    Number of Visits 25    Date for PT Re-Evaluation 08/28/23    Authorization Type [employer] Medicare    Authorization Time Period [employer] MEDICARE $20 COPAY 16 PT VISITS APPROVED 2/4-5/27/25    Authorization - Visit Number 14    Authorization - Number of Visits 16    Progress Note Due on Visit 20    PT Start Time 1145    PT Stop Time 1225    PT Time Calculation (min) 40 min    Equipment Utilized During Treatment Gait belt    Activity Tolerance Patient tolerated treatment well;Patient limited by pain;Patient limited by fatigue    Behavior During Therapy [facility] for tasks assessed/performed                       Past Medical History:  Diagnosis Date   Aortic stenosis, mild    Arthritis of hand    "just a little bit in both hands" (03/31/2012)   Asthma    "little bit" (03/31/2012)   Atrial fibrillation (HCC)    dx '04; DCCV '04, placed on flecainide, failed DCCV 04/2010, flecainide stopped; s/p successful A.fib ablation 01/31/12   CHF (congestive heart failure) (HCC)    Controlled type 2 diabetes mellitus without complication, without long-term current use of insulin (HCC) 12/13/2019   COPD (chronic obstructive pulmonary disease) (HCC)    DJD (degenerative joint disease)    Fatty liver    Fibromyalgia    GERD (gastroesophageal reflux disease)    Hyperlipidemia    Hypertension    Hypothyroidism    S/P radiation   Left atrial enlargement    LA size 45mm by echo 11/21/11   Mitral regurgitation    trivial   Obstructive sleep apnea    mild by sleep study 2013; pt stated he does not have a machine because "it wasn't bad enough for him to have one"   Pacemaker 03/31/2012   Pneumonia    last time 2023   Restless leg syndrome    Second degree AV block     Splenomegaly    Past Surgical History:  Procedure Laterality Date   AMPUTATION Right 08/23/2022   Procedure: RIGHT FOOT FIFTH RAY AMPUTATION;  Surgeon: [name], MD;  Location: [facility] OR;  Service: Orthopedics;  Laterality: Right;   AMPUTATION Right 09/04/2022   Procedure: RIGHT BELOW KNEE AMPUTATION;  Surgeon: [name], MD;  Location: [facility] OR;  Service: Orthopedics;  Laterality: Right;   AORTIC VALVE REPLACEMENT N/A 08/22/2015   Procedure: AORTIC VALVE REPLACEMENT (AVR);  Surgeon: Kerin Perna, MD;  Location: [facility] OR;  Service: Open Heart Surgery;  Laterality: N/A;   ATRIAL FIBRILLATION ABLATION  01/30/2012   PVI by Dr. Johney Frame   ATRIAL FIBRILLATION ABLATION N/A 01/31/2012   Procedure: ATRIAL FIBRILLATION ABLATION;  Surgeon: [name], MD;  Location: [facility] CATH LAB;  Service: Cardiovascular;  Laterality: N/A;   ATRIAL FIBRILLATION ABLATION N/A 04/19/2020   Procedure: ATRIAL FIBRILLATION ABLATION;  Surgeon: [name], MD;  Location: [facility] INVASIVE CV LAB;  Service: Cardiovascular;  Laterality: N/A;   BACK SURGERY     X 3   BIOPSY  01/01/2022   Procedure: BIOPSY;  Surgeon: Kathi Der, MD;  Location: [facility] ENDOSCOPY;  Service: Gastroenterology;;   CARDIAC CATHETERIZATION N/A 08/09/2015   Procedure: Right/Left Heart Cath and Coronary Angiography;  Surgeon: Peter M Swaziland, MD;  Location: [facility] INVASIVE CV LAB;  Service: Cardiovascular;  Laterality: N/A;   CARDIAC CATHETERIZATION N/A 02/05/2016   Procedure: Right/Left Heart Cath and Coronary Angiography;  Surgeon: Corky Crafts, MD;  Location: [facility] INVASIVE CV LAB;  Service: Cardiovascular;  Laterality: N/A;   CARDIAC CATHETERIZATION N/A 04/17/2016   Procedure: Right Heart Cath;  Surgeon: Dolores Patty, MD;  Location: [facility] INVASIVE CV LAB;  Service: Cardiovascular;  Laterality: N/A;   CARDIOVASCULAR STRESS TEST  03/12/2002   EF 48%, NO EVIDENCE OF ISCHEMIA   CARDIOVERSION  01/2012; 03/31/2012   CARDIOVERSION N/A 02/25/2012    Procedure: CARDIOVERSION;  Surgeon: [name], MD;  Location: [facility] CATH LAB;  Service: Cardiovascular;  Laterality: N/A;   CARDIOVERSION N/A 03/31/2012   Procedure: CARDIOVERSION;  Surgeon: [name], MD;  Location: [facility] CATH LAB;  Service: Cardiovascular;  Laterality: N/A;   CARDIOVERSION N/A 11/23/2015   Procedure: CARDIOVERSION;  Surgeon: Laurey Morale, MD;  Location: [facility] ENDOSCOPY;  Service: Cardiovascular;  Laterality: N/A;   CARDIOVERSION N/A 04/08/2016   Procedure: CARDIOVERSION;  Surgeon: Dolores Patty, MD;  Location: [facility] ENDOSCOPY;  Service: Cardiovascular;  Laterality: N/A;   CARDIOVERSION N/A 09/14/2018   Procedure: CARDIOVERSION;  Surgeon: Wendall Stade, MD;  Location: [facility] ENDOSCOPY;  Service: Cardiovascular;  Laterality: N/A;   CARDIOVERSION N/A 10/19/2019   Procedure: CARDIOVERSION;  Surgeon: Dolores Patty, MD;  Location: [facility] ENDOSCOPY;  Service: Cardiovascular;  Laterality: N/A;   CARDIOVERSION N/A 02/25/2020   Procedure: CARDIOVERSION;  Surgeon: Pricilla Riffle, MD;  Location: [facility] ENDOSCOPY;  Service: Cardiovascular;  Laterality: N/A;   CARDIOVERSION N/A 05/25/2020   Procedure: CARDIOVERSION;  Surgeon: Lewayne Bunting, MD;  Location: [facility] ENDOSCOPY;  Service: Cardiovascular;  Laterality: N/A;   CARDIOVERSION N/A 07/10/2020   Procedure: CARDIOVERSION;  Surgeon: Jake Bathe, MD;  Location: [facility] ENDOSCOPY;  Service: Cardiovascular;  Laterality: N/A;   CLIPPING OF ATRIAL APPENDAGE N/A 08/22/2015   Procedure: CLIPPING OF ATRIAL APPENDAGE;  Surgeon: Kerin Perna, MD;  Location: [facility] OR;  Service: Open Heart Surgery;  Laterality: N/A;   COLONOSCOPY WITH PROPOFOL N/A 01/01/2022   Procedure: COLONOSCOPY WITH PROPOFOL;  Surgeon: Kathi Der, MD;  Location: [facility] ENDOSCOPY;  Service: Gastroenterology;  Laterality: N/A;   DOPPLER ECHOCARDIOGRAPHY  03/11/2002   EF 70-75%   FINGER SURGERY Left    Middle finger   FINGER TENDON REPAIR  1980's   "right little finger" (03/31/2012)    FLEXIBLE SIGMOIDOSCOPY N/A 12/30/2021   Procedure: FLEXIBLE SIGMOIDOSCOPY;  Surgeon: Charlott Rakes, MD;  Location: [facility] ENDOSCOPY;  Service: Gastroenterology;  Laterality: N/A;   INSERT / REPLACE / REMOVE PACEMAKER     St Jude   IR RADIOLOGIST EVAL & MGMT  05/06/2019   IR RADIOLOGIST EVAL & MGMT  10/06/2019   IR RADIOLOGIST EVAL & MGMT  10/31/2020   IR RADIOLOGIST EVAL & MGMT  01/18/2021   IR RADIOLOGIST EVAL & MGMT  03/08/2021   IR RADIOLOGIST EVAL & MGMT  06/13/2021   KIDNEY SURGERY Left    LEFT AND RIGHT HEART CATHETERIZATION WITH CORONARY ANGIOGRAM N/A 10/27/2012   Procedure: LEFT AND RIGHT HEART CATHETERIZATION WITH CORONARY ANGIOGRAM;  Surgeon: Pamella Pert, MD;  Location: [facility] CATH LAB;  Service: Cardiovascular;  Laterality: N/A;   LUMBAR DISC SURGERY  1980's X2;  2000's   MAZE N/A 08/22/2015   Procedure: MAZE;  Surgeon: Kerin Perna, MD;  Location: [facility] OR;  Service: Open Heart Surgery;  Laterality: N/A;   PACEMAKER INSERTION  03/31/2012   STJ Accent DR pacemaker implanted by Dr Johney Frame   PERMANENT PACEMAKER INSERTION N/A 03/31/2012   Procedure: PERMANENT PACEMAKER INSERTION;  Surgeon: [name], MD;  Location: [facility] CATH LAB;  Service: Cardiovascular;  Laterality: N/A;   PPM GENERATOR CHANGEOUT N/A 08/08/2020   Procedure: PPM GENERATOR CHANGEOUT;  Surgeon: [name], MD;  Location: [facility] INVASIVE CV LAB;  Service: Cardiovascular;  Laterality: N/A;   RADIOLOGY WITH ANESTHESIA Left 02/14/2021   Procedure: RADIOLOGY WITH ANESTHESIA LEFT RENAL CRYOABLATION;  Surgeon: Irish Lack, MD;  Location: [facility] ORS;  Service: Radiology;  Laterality: Left;   STUMP REVISION Right 10/09/2022   Procedure: REVISION RIGHT BELOW KNEE AMPUTATION;  Surgeon: [name], MD;  Location: [facility] OR;  Service: Orthopedics;  Laterality: Right;   STUMP REVISION Right 11/16/2022   Procedure: REVISION RIGHT BELOW KNEE AMPUTATION;  Surgeon: [name], MD;  Location: [facility] OR;  Service: Orthopedics;   Laterality: Right;   TEE WITHOUT CARDIOVERSION  01/30/2012   Procedure: TRANSESOPHAGEAL ECHOCARDIOGRAM (TEE);  Surgeon: Laurey Morale, MD;  Location: [facility] ENDOSCOPY;  Service: Cardiovascular;  Laterality: N/A;  ablation next day   TEE WITHOUT CARDIOVERSION N/A 08/22/2015   Procedure: TRANSESOPHAGEAL ECHOCARDIOGRAM (TEE);  Surgeon: Kerin Perna, MD;  Location: [facility] OR;  Service: Open Heart Surgery;  Laterality: N/A;   TEE WITHOUT CARDIOVERSION N/A 10/19/2019   Procedure: TRANSESOPHAGEAL ECHOCARDIOGRAM (TEE);  Surgeon: Dolores Patty, MD;  Location: [facility] ENDOSCOPY;  Service: Cardiovascular;  Laterality: N/A;   US ECHOCARDIOGRAPHY  08/07/2009   EF 55-60%   Patient Active Problem List   Diagnosis Date Noted   AKI (acute kidney injury) (HCC) 12/22/2022   Fall 11/14/2022   Pain of right lower extremity due to injury 11/14/2022   Diabetic ulcer of lower leg (HCC) 11/13/2022   Osteomyelitis (HCC) 11/13/2022   Dyslipidemia 10/18/2022   Cellulitis of right lower extremity 10/17/2022   Dehiscence of amputation stump of right lower extremity (HCC) 10/09/2022   Below-knee amputation of right lower extremity (HCC) 10/09/2022   Gangrene of right foot (HCC) 09/04/2022   History of below-knee amputation of right lower extremity (HCC) 09/04/2022   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) 08/23/2022   CAP (community acquired pneumonia) 12/28/2021   Constipation 12/28/2021   Pain in both lower extremities 07/03/2021   Peripheral neuropathy 07/03/2021   Acute metabolic encephalopathy 06/09/2021   Marijuana dependence (HCC) 06/09/2021   DNR (do not resuscitate) 06/09/2021   Chest pain 06/08/2021   Renal lesion 02/14/2021   Status post cryoablation 02/14/2021   Allergic rhinitis 12/08/2020   Preop pulmonary/respiratory exam 03/27/2020   Acute on chronic diastolic (congestive) heart failure (HCC) 01/22/2020   Abdominal pain 01/22/2020   Hypothyroidism 12/13/2019   DM2 (diabetes mellitus,  type 2) (HCC) 12/13/2019   Chronic kidney disease, stage 3b (HCC) 12/13/2019   Acute bacterial bronchitis 12/13/2019   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   Typical atrial flutter (HCC)    Chronic respiratory failure with hypoxia (HCC) 05/23/2016   CAD (coronary artery disease) 05/23/2016   Persistent atrial fibrillation (HCC)    Atypical atrial flutter (HCC)    Visit for monitoring Tikosyn therapy 04/16/2016   Chronic diastolic CHF (congestive heart failure) (HCC) 02/03/2016   Cough 12/06/2015   Acute on chronic respiratory failure with hypoxia (HCC) 09/08/2015   Hallucinations 09/04/2015   Lymphadenopathy 09/04/2015  Ascending aortic aneurysm (HCC) 09/04/2015   Hypoxia 09/02/2015   S/P aortic valve replacement with bioprosthetic valve 09/02/2015   Pleural effusion 09/02/2015   S/P AVR 08/22/2015   GERD without esophagitis 09/20/2014   Intrinsic asthma 10/13/2013   Dyspnea 09/15/2013   Pulmonary hypertension (HCC) 09/15/2013   Shortness of breath 05/05/2013   Pacemaker-St.Jude 04/01/2012   Bradycardia 03/31/2012   Second degree AV block 03/31/2012   AV block, 1st degree 02/01/2012   PAF (paroxysmal atrial fibrillation) (HCC) 11/13/2011   Fatigue 11/13/2011   Essential hypertension 11/13/2011   Aortic stenosis 11/13/2011   Sleep apnea 11/13/2011    PCP: Alysia Penna, MD  REFERRING PROVIDER: Aldean Baker, MD  ONSET DATE: 04/01/2023 prosthesis delivery  REFERRING DIAG: Z89.511 (ICD-10-CM) - S/P BKA (below knee amputation), right   THERAPY DIAG:  Other abnormalities of gait and mobility  Unsteadiness on feet  Pain in right leg  Pain in left leg  Muscle weakness (generalized)  Impaired functional mobility, balance, gait, and endurance  Rationale for Evaluation and Treatment: Rehabilitation  SUBJECTIVE:   SUBJECTIVE STATEMENT: He tried positioning arms in his recliner and it helped some.  [place]  [place]end.    PERTINENT HISTORY:  right TTA 09/04/22, DM2, HLD, asthma, COPD, A-Fib, CAD, pacemaker, cardioversion & catheterization, arthritis, Fibromyalgia, GERD, HTN,   PAIN:  Are you having pain? Yes: NPRS scale:  today  10/10 Right leg, 8/10 left leg  and in last week down lower both legs Pain location: both legs Pain description: neuropathy, sharp, sticking "big" needles in leg Aggravating factors: meds wearing off,  standing & walking Relieving factors: pain pill 2x/day  Residual limb prosthesis on limb 0/10 - 10/10,  stand or walk 5/10 - 10/10  PRECAUTIONS: Fall and ICD/Pacemaker  WEIGHT BEARING RESTRICTIONS: No  FALLS: Has patient fallen in last 6 months? Yes. Number of falls 1 no injuries  LIVING ENVIRONMENT: Lives with: lives with their spouse and 3 dogs (mini schnauzer 15# indoor & 2 german [place] outdoor)  Lives in: [place] Home Access: Ramped entrance Home layout: One level Stairs: Yes: External: 6 steps; can reach both ramp at primary entrance Has following equipment at home: Single point cane, Environmental consultant - 2 wheeled, Wheelchair (manual), shower chair, Tour manager, and Ramped entry  OCCUPATION: retired from [employer]  PLOF: Independent, Independent with household mobility without device, and Independent with community mobility without device used oxygen as needed  PATIENT GOALS:  use prosthesis to function in community & in house, do odd repairs, yard work  OBJECTIVE:  COGNITION: Overall cognitive status: Within functional limits for tasks assessed   SENSATION: WFL  CARDIOVASCULAR RESPONSE: Functional activity: balance and gait assessments Pre-activity vitals: no oxygen seated in w/c HR: 67 SpO2: 93 Post-activity vitals: Berg with no oxygen  HR: 78 SpO2: 90-91%  94% on oxygen at rest, HR 70 After gait with O2 SpO2 94%, HR 73  Modified Borg scale for dyspnea: 2: mild shortness of breath  POSTURE: rounded shoulders, forward head, flexed trunk , and  weight shift left  LOWER EXTREMITY ROM:  ROM P:passive  A:active Right eval  Hip flexion   Hip extension standing -10*   Hip abduction   Hip adduction   Hip internal rotation   Hip external rotation   Knee flexion   Knee extension   Ankle dorsiflexion   Ankle plantarflexion   Ankle inversion   Ankle eversion    (Blank rows =  not tested)  LOWER EXTREMITY MMT:  MMT Right eval Left eval  Hip flexion    Hip extension 3-4/5 3-4/5  Hip abduction 3-4/5 4/5  Hip adduction    Hip internal rotation    Hip external rotation    Knee flexion 3-4/5 4/5  Knee extension 3-4/5 4/5  Ankle dorsiflexion    Ankle plantarflexion    Ankle inversion    Ankle eversion    At Evaluation all strength testing is grossly seated and functionally standing / gait. (Blank rows = not tested)  TRANSFERS: Evaluation on 06/03/2023:    Sit to stand: SBA from 20" w/c using BUEs on armrests to arise stabilizes with back of legs against w/c Stand to sit: SBA uses BUEs on armrests of w/c to control descent  FUNCTIONAL TESTs:  Evaluation on 06/03/2023:   Berg Balance Scale: 19/56  GAIT: Evaluation on 06/03/2023:    Gait pattern: step to pattern, decreased step length- Left, decreased stance time- Right, decreased hip/knee flexion- Right, decreased ankle dorsiflexion- Right, Right hip hike, knee flexed in stance- Right, lateral hip instability, trunk flexed, and abducted- Right Distance walked: 25' Assistive device utilized: Environmental consultant - 2 wheeled and TTA prosthesis Level of assistance: Min A balance loss to left on straight path & 180* turn Comments: used O2 with portable unit slung over left shoulder,  pain in residual limb limited distance,   CURRENT PROSTHETIC WEAR ASSESSMENT: Evaluation on 06/03/2023:    Patient is dependent with: skin check, residual limb care, care of non-amputated limb, prosthetic cleaning, ply sock cleaning, correct ply sock adjustment, proper wear schedule/adjustment, and proper  weight-bearing schedule/adjustment Donning prosthesis: dependent with PT 100% cues on technique Doffing prosthesis: reports modified independent Prosthetic wear tolerance: 2-4 hours, 1x/day, ~3 days/week Prosthetic weight bearing tolerance: 5 minutes with partial weight on prosthesis with pain increasing to 8/10 (intolerable) Edema: pitting with 7 sec capillary refill Residual limb condition: dry skin especially distal limb, 3 scratches with superficial scab on distal limb, scar healed but adhered over entire area of distal tibia, bulbous shape,  Prosthetic description: silicon liner with pin lock suspension, total contact socket with flexible inner socket, dynamic response foot K code/activity level with prosthetic use: Level 3    TODAY'S TREATMENT:                                                                                                                             DATE:  08/07/2023: Prosthetic Care with TTA prosthesis: PT educated pt & wife in socket revisions and replacement liners every 6 months.  He will need a socket revision ~09/30/23 but needs appt end of May with Dr. Lajoyce Corners to order it.  PT recommending appt with prosthetist now for pads in socket and dynamic alignment.  PT educated that pads will take up some of space that socks are doing now so will need less socks.  PT recommended using golf cart for long walk to family graveyard then rollator  at grave side so he can sit as needed. Pt & wife agreed with recommendation.   Therapeutic Exercise: - Seated Hamstring Stretch with Strap  2 reps - 30 seconds hold both LEs - Standing Gastroc Stretch on Step  2 reps - 30 seconds hold both LEs - Upright Stance at Door Frame Single Arm 2 reps - 2 deep breathes hold - Upright Stance at Door Frame with Both Arms 2 deep breathes hold PT instructed with HO, demo & verbal cues in above as HEP. Pt verbalized understanding including rationale to help with neuropathy pain.    TREATMENT:                                                                                                                              DATE:  08/04/2023: Prosthetic Care with TTA prosthesis: Patient ambulated in and out of clinic with rollator walker safely.  PT did give verbal cues on upright posture. Patient ambulated 30' x 2 with [facility] and min assistance.  PT demo and verbal cues on proper location of the cane and a slight step through pattern with LLE.  Patient requires HHA about halfway through both walks due to lower extremity fatigue.  Patient and wife verbalize how to properly give HHA as needed when walking. Pt & wife verbalize understanding of providing HHA.   Self-care: Patient reports that his neck position especially while sitting and sleeping (sleeps in recliner) is beginning to limit his upper extremity strength and lower extremity strength.  PT demo scapular depression as he sits with exaggerated elevation which will trigger upper trapezius pain.  Patient return demonstration scapular depression exercises.  PT recommended positioning when sitting so that arm weight is supported by the armrest.  PT also discussed that soft collar & soft strap at forehead at night to prevent cervical flexion when sleeping may help. Patient and wife verbalized understanding.      TREATMENT:                                                                                                                             DATE:  07/30/2023: Prosthetic Care with TTA prosthesis: PT educated patient on use of barstool for ADLs that he typically would stand.  PT demoed sit to and from stand from a barstool.  Patient able to perform sit to/from stand from 24 inch and 29".  Patient was tolerant of that he  could be seated upright with feet planted on floor for balance on either height.  Standard chairs are 18 inches tall.  Use of a barstool when increases height by 6 or 11 inches enabling upper extremity function and vision improved at counter  heights.  Also he has the ability to balance better not requiring upper extremity assist for support.  Patient verbalized understanding and reports he believes it would be of benefit for activities in his workshop, kitchen and bathroom. Patient ambulated 25 feet with table support RUE and cane LUE.  1 rep with standard cane with a stand-alone tip and 1 rep with small base SBQC.  Patient was safe performing this activity with upper extremity support on counter rail or table opposite of the cane.  PT described the difference between these 2 canes and also LBQC.  Patient reports she was more comfortable with the [facility] and feels like it would be beneficial for trying to do some activities and his workshop in the kitchen.    PATIENT EDUCATION: PATIENT EDUCATED ON FOLLOWING PROSTHETIC CARE: Education details:  Skin check, Residual limb care, Prosthetic cleaning, Correct ply sock adjustment, Propper donning, and Proper wear schedule/adjustment Prosthetic wear tolerance: 2 hours 2x/day, 7 days/week Person educated: Patient and Spouse Education method: Explanation, Demonstration, Tactile cues, and Verbal cues Education comprehension: verbalized understanding, verbal cues required, tactile cues required, and needs further education  HOME EXERCISE PROGRAM: Access Code: [email or web address] URL: [email or web address] Date: 08/07/2023 Prepared by: Vladimir Faster  Exercises - Seated Hamstring Stretch with Strap  - 1-2 x daily - 7 x weekly - 1 sets - 2-3 reps - 30 seconds hold - Standing Gastroc Stretch on Step  - 1-2 x daily - 7 x weekly - 1 sets - 2-3 reps - 30 seconds hold - Upright Stance at Door Frame Single Arm  - 1-3 x daily - 7 x weekly - 1 sets - 2 reps - 2 deep breathes hold - Upright Stance at Door Frame with Both Arms  - 1-3 x daily - 7 x weekly - 1 sets - 2 reps - 2 deep breathes hold   Printed and issued 06/25/23 Do each exercise 1-2  times per day Do each exercise 5-10 repetitions Hold  each exercise for 2 seconds to feel your location  AT SINK FIND YOUR MIDLINE POSITION AND PLACE FEET EQUAL DISTANCE FROM THE MIDLINE.  Try to find this position when standing still for activities.   USE TAPE ON FLOOR TO MARK THE MIDLINE POSITION which is even with middle of sink.  You also should try to feel with your limb pressure in socket.  You are trying to feel with limb what you used to feel with the bottom of your foot.  Side to Side Shift: Moving your hips only (not shoulders): move weight onto your left leg, HOLD/FEEL pressure in socket.  Move back to equal weight on each leg, HOLD/FEEL pressure in socket. Move weight onto your right leg, HOLD/FEEL pressure in socket. Move back to equal weight on each leg, HOLD/FEEL pressure in socket. Repeat.  Start with both hands on sink, progress to hand on prosthetic side only, then no hands.  Front to Back Shift: Moving your hips only (not shoulders): move your weight forward onto your toes, HOLD/FEEL pressure in socket. Move your weight back to equal Flat Foot on both legs, HOLD/FEEL  pressure in socket. Move your weight back onto your heels, HOLD/FEEL  pressure in socket. Move your weight back to equal on both  legs, HOLD/FEEL  pressure in socket. Repeat.  Start with both hands on sink, progress to hand on prosthetic side only, then no hands.  Moving Cones / Cups: With equal weight on each leg: Hold on with one hand the first time, then progress to no hand supports. Move cups from one side of sink to the other. Place cups ~2" out of your reach, progress to 10" beyond reach.  Place one hand in middle of sink and reach with other hand. Do both arms.  Then hover one hand and move cups with other hand.  Overhead/Upward Reaching: alternated reaching up to top cabinets or ceiling if no cabinets present. Keep equal weight on each leg. Start with one hand support on counter while other hand reaches and progress to no hand support with reaching.  ace one hand in  middle of sink and reach with other hand. Do both arms.  Then hover one hand and move cups with other hand.  5.   Looking Over Shoulders: With equal weight on each leg: alternate turning to look over your shoulders with one hand support on counter as needed.  Start with head motions only to look in front of shoulder, then even with shoulder and progress to looking behind you. To look to side, move head /eyes, then shoulder on side looking pulls back, shift more weight to side looking and pull hip back. Place one hand in middle of sink and let go with other hand so your shoulder can pull back. Switch hands to look other way.   Then hover one hand and look over shoulder. If looking right, use left hand at sink. If looking left, use right hand at sink. 6.  Stepping with leg that is not amputated:  Move items under cabinet out of your way. Shift your hips/pelvis so weight on prosthesis. Tighten muscles in hip on prosthetic side.  SLOWLY step other leg so front of foot is in cabinet. Then step back to floor.   ASSESSMENT:  CLINICAL IMPRESSION: Patient appears to understand stretches instructed today.  His mobility is improving but limited by his chronic pain issues.  Patient continues to benefit from skilled physical therapy.    OBJECTIVE IMPAIRMENTS: Abnormal gait, cardiopulmonary status limiting activity, decreased activity tolerance, decreased balance, decreased endurance, decreased knowledge of condition, decreased knowledge of use of DME, decreased mobility, difficulty walking, decreased ROM, decreased strength, increased edema, prosthetic dependency , and pain.   ACTIVITY LIMITATIONS: carrying, lifting, bending, standing, stairs, transfers, and locomotion level  PARTICIPATION LIMITATIONS: meal prep, cleaning, driving, community activity, and yard work  PERSONAL FACTORS: Fitness, Time since onset of injury/illness/exacerbation, and 3+ comorbidities: see PMH  are also affecting patient's functional  outcome.   REHAB POTENTIAL: Good  CLINICAL DECISION MAKING: Evolving/moderate complexity  EVALUATION COMPLEXITY: Moderate   GOALS: Goals reviewed with patient? Yes  SHORT TERM GOALS: Target date: 07/02/2023  Patient donnes prosthesis modified independent & verbalizes proper cleaning. Baseline: SEE OBJECTIVE DATA Goal status:   MET 06/30/2023 2.  Patient tolerates prosthesis >6 hrs total /day without skin issues or limb pain >5/10 after standing. Baseline: SEE OBJECTIVE DATA Goal status: Partially MET 06/30/2023  3.  Patient able to reach 5" and looks to sides without UE support with supervision. Baseline: SEE OBJECTIVE DATA Goal status: MET 07/07/2023  4. Patient ambulates 90' with RW & prosthesis with supervision. Baseline: SEE OBJECTIVE DATA Goal status: MET 07/07/2023  5. Patient negotiates ramps & curbs with RW & prosthesis with minA. Baseline: SEE  OBJECTIVE DATA Goal status: MET 06/23/2023  LONG TERM GOALS: Target date: 08/28/2023  Patient demonstrates & verbalized understanding of prosthetic care to enable safe utilization of prosthesis. Baseline: SEE OBJECTIVE DATA Goal status:   Ongoing   08/04/2023  Patient tolerates prosthesis wear >90% of awake hours without skin or limb pain issues. Baseline: SEE OBJECTIVE DATA Goal status: Ongoing   08/04/2023  Berg Balance >36/56 to indicate lower fall risk Baseline: SEE OBJECTIVE DATA Goal status: Ongoing   08/04/2023  Patient ambulates >300' with prosthesis & LRAD independently Baseline: SEE OBJECTIVE DATA Goal status: Ongoing   08/04/2023  Patient negotiates ramps, curbs & stairs with single rail with prosthesis & LRAD independently. Baseline: SEE OBJECTIVE DATA Goal status: Ongoing  08/04/2023   PLAN:  PT FREQUENCY: 2x/week  PT DURATION: 12 weeks  PLANNED INTERVENTIONS: 97164- PT Re-evaluation, 97110-Therapeutic exercises, 97530- Therapeutic activity, 97112- Neuromuscular re-education, [phone]- Self Care, 52841- Gait  training, [phone]- Prosthetic training, Patient/Family education, Balance training, Stair training, Scar mobilization, Vestibular training, DME instructions, and Physical Performance Testing  PLAN FOR NEXT SESSION:  Check on stretches.  Continue ambulation with SBQC, continue to progress ambulation tolerance as patient is fit, standing balance activities.    Date of referral: 04/17/2023  PT eval delayed by COPD exacerbation Referring provider: Aldean Baker, MD Referring diagnosis? Z89.511 (ICD-10-CM) - S/P BKA (below knee amputation), right Treatment diagnosis? (if different than referring diagnosis)   Other abnormalities of gait & mobility R26.89 Unsteadiness on feet R26.81 Pain in left leg M79.605 Pain in right leg M79.604 Muscle Weakness (generalized) M62.81 History of falling Z91.81 Impaired functional mobility, balance, gait and endurance  What was this (referring dx) caused by? Surgery (Type: right BKA)  Nature of Condition: Initial Onset (within last 3 months)  prosthesis delivery 04/01/2023   Laterality: Rt  Current Functional Measure Score: Other Berg Balance 19/56  Objective measurements identify impairments when they are compared to normal values, the uninvolved extremity, and prior level of function.  [x]  Yes  []  No  Objective assessment of functional ability: Severe functional limitations   Briefly describe symptoms: dependent in care & use of prosthesis, impaired balance and gait, pain in both legs  How did symptoms start: amputation from wounds  Average pain intensity:  Last 24 hours: 8/10  Past week: 8/10  How often does the pt experience symptoms? Constantly  How much have the symptoms interfered with usual daily activities? Extremely  How has condition changed since care began at this facility? NA - initial visit  In general, how is the patients overall health? Good   BACK PAIN (STarT Back Screening Tool) No   Vladimir Faster, PT, DPT 08/07/2023,  12:40 PM

## 2023-08-08 LAB — CUP PACEART REMOTE DEVICE CHECK
Battery Remaining Longevity: 100 mo
Battery Remaining Percentage: 77 %
Battery Voltage: 3.01 V
Brady Statistic RV Percent Paced: 99 %
Date Time Interrogation Session: 20250410081515
Implantable Lead Connection Status: 753985
Implantable Lead Connection Status: 753985
Implantable Lead Implant Date: 20131203
Implantable Lead Implant Date: 20131203
Implantable Lead Location: 753859
Implantable Lead Location: 753860
Implantable Lead Model: 1948
Implantable Pulse Generator Implant Date: 20220412
Lead Channel Impedance Value: 540 Ohm
Lead Channel Pacing Threshold Amplitude: 1.25 V
Lead Channel Pacing Threshold Pulse Width: 0.5 ms
Lead Channel Sensing Intrinsic Amplitude: 12 mV
Lead Channel Setting Pacing Amplitude: 1.5 V
Lead Channel Setting Pacing Pulse Width: 0.5 ms
Lead Channel Setting Sensing Sensitivity: 4 mV
Pulse Gen Model: 2272
Pulse Gen Serial Number: 3919039

## 2023-08-11 ENCOUNTER — Encounter: Admitting: Physical Therapy

## 2023-08-11 NOTE — Therapy (Deleted)
 OUTPATIENT PHYSICAL THERAPY PROSTHETIC TREATMENT   Patient Name: [name] MRN: [id number] DOB:1950-09-24, 73 y.o., male Today's Date: 08/11/2023  END OF SESSION:              Past Medical History:  Diagnosis Date   Aortic stenosis, mild    Arthritis of hand    "just a little bit in both hands" (03/31/2012)   Asthma    "little bit" (03/31/2012)   Atrial fibrillation (HCC)    dx '04; DCCV '04, placed on flecainide, failed DCCV 04/2010, flecainide stopped; s/p successful A.fib ablation 01/31/12   CHF (congestive heart failure) (HCC)    Controlled type 2 diabetes mellitus without complication, without long-term current use of insulin (HCC) 12/13/2019   COPD (chronic obstructive pulmonary disease) (HCC)    DJD (degenerative joint disease)    Fatty liver    Fibromyalgia    GERD (gastroesophageal reflux disease)    Hyperlipidemia    Hypertension    Hypothyroidism    S/P radiation   Left atrial enlargement    LA size 45mm by echo 11/21/11   Mitral regurgitation    trivial   Obstructive sleep apnea    mild by sleep study 2013; pt stated he does not have a machine because "it wasn't bad enough for him to have one"   Pacemaker 03/31/2012   Pneumonia    last time 2023   Restless leg syndrome    Second degree AV block    Splenomegaly    Past Surgical History:  Procedure Laterality Date   AMPUTATION Right 08/23/2022   Procedure: RIGHT FOOT FIFTH RAY AMPUTATION;  Surgeon: [name], MD;  Location: [facility] OR;  Service: Orthopedics;  Laterality: Right;   AMPUTATION Right 09/04/2022   Procedure: RIGHT BELOW KNEE AMPUTATION;  Surgeon: [name], MD;  Location: [facility] OR;  Service: Orthopedics;  Laterality: Right;   AORTIC VALVE REPLACEMENT N/A 08/22/2015   Procedure: AORTIC VALVE REPLACEMENT (AVR);  Surgeon: Kerin Perna, MD;  Location: [facility] OR;  Service: Open Heart Surgery;  Laterality: N/A;   ATRIAL FIBRILLATION ABLATION  01/30/2012   PVI by Dr. Johney Frame   ATRIAL  FIBRILLATION ABLATION N/A 01/31/2012   Procedure: ATRIAL FIBRILLATION ABLATION;  Surgeon: [name], MD;  Location: [facility] CATH LAB;  Service: Cardiovascular;  Laterality: N/A;   ATRIAL FIBRILLATION ABLATION N/A 04/19/2020   Procedure: ATRIAL FIBRILLATION ABLATION;  Surgeon: [name], MD;  Location: [facility] INVASIVE CV LAB;  Service: Cardiovascular;  Laterality: N/A;   BACK SURGERY     X 3   BIOPSY  01/01/2022   Procedure: BIOPSY;  Surgeon: Kathi Der, MD;  Location: [facility] ENDOSCOPY;  Service: Gastroenterology;;   CARDIAC CATHETERIZATION N/A 08/09/2015   Procedure: Right/Left Heart Cath and Coronary Angiography;  Surgeon: Peter M Swaziland, MD;  Location: [facility] INVASIVE CV LAB;  Service: Cardiovascular;  Laterality: N/A;   CARDIAC CATHETERIZATION N/A 02/05/2016   Procedure: Right/Left Heart Cath and Coronary Angiography;  Surgeon: Corky Crafts, MD;  Location: [facility] INVASIVE CV LAB;  Service: Cardiovascular;  Laterality: N/A;   CARDIAC CATHETERIZATION N/A 04/17/2016   Procedure: Right Heart Cath;  Surgeon: Dolores Patty, MD;  Location: [facility] INVASIVE CV LAB;  Service: Cardiovascular;  Laterality: N/A;   CARDIOVASCULAR STRESS TEST  03/12/2002   EF 48%, NO EVIDENCE OF ISCHEMIA   CARDIOVERSION  01/2012; 03/31/2012   CARDIOVERSION N/A 02/25/2012   Procedure: CARDIOVERSION;  Surgeon: [name], MD;  Location: [facility] CATH LAB;  Service: Cardiovascular;  Laterality:  N/A;   CARDIOVERSION N/A 03/31/2012   Procedure: CARDIOVERSION;  Surgeon: Jolly Needle, MD;  Location: [facility] CATH LAB;  Service: Cardiovascular;  Laterality: N/A;   CARDIOVERSION N/A 11/23/2015   Procedure: CARDIOVERSION;  Surgeon: Darlis Eisenmenger, MD;  Location: [facility] ENDOSCOPY;  Service: Cardiovascular;  Laterality: N/A;   CARDIOVERSION N/A 04/08/2016   Procedure: CARDIOVERSION;  Surgeon: Mardell Shade, MD;  Location: [facility] ENDOSCOPY;  Service: Cardiovascular;  Laterality: N/A;   CARDIOVERSION N/A 09/14/2018   Procedure: CARDIOVERSION;   Surgeon: Loyde Rule, MD;  Location: [facility] ENDOSCOPY;  Service: Cardiovascular;  Laterality: N/A;   CARDIOVERSION N/A 10/19/2019   Procedure: CARDIOVERSION;  Surgeon: Mardell Shade, MD;  Location: [facility] ENDOSCOPY;  Service: Cardiovascular;  Laterality: N/A;   CARDIOVERSION N/A 02/25/2020   Procedure: CARDIOVERSION;  Surgeon: Elmyra Haggard, MD;  Location: [facility] ENDOSCOPY;  Service: Cardiovascular;  Laterality: N/A;   CARDIOVERSION N/A 05/25/2020   Procedure: CARDIOVERSION;  Surgeon: Lenise Quince, MD;  Location: [facility] ENDOSCOPY;  Service: Cardiovascular;  Laterality: N/A;   CARDIOVERSION N/A 07/10/2020   Procedure: CARDIOVERSION;  Surgeon: Hugh Madura, MD;  Location: [facility] ENDOSCOPY;  Service: Cardiovascular;  Laterality: N/A;   CLIPPING OF ATRIAL APPENDAGE N/A 08/22/2015   Procedure: CLIPPING OF ATRIAL APPENDAGE;  Surgeon: Heriberto London, MD;  Location: [facility] OR;  Service: Open Heart Surgery;  Laterality: N/A;   COLONOSCOPY WITH PROPOFOL N/A 01/01/2022   Procedure: COLONOSCOPY WITH PROPOFOL;  Surgeon: Felecia Hopper, MD;  Location: [facility] ENDOSCOPY;  Service: Gastroenterology;  Laterality: N/A;   DOPPLER ECHOCARDIOGRAPHY  03/11/2002   EF 70-75%   FINGER SURGERY Left    Middle finger   FINGER TENDON REPAIR  1980's   "right little finger" (03/31/2012)   FLEXIBLE SIGMOIDOSCOPY N/A 12/30/2021   Procedure: FLEXIBLE SIGMOIDOSCOPY;  Surgeon: Baldo Bonds, MD;  Location: [facility] ENDOSCOPY;  Service: Gastroenterology;  Laterality: N/A;   INSERT / REPLACE / REMOVE PACEMAKER     St Jude   IR RADIOLOGIST EVAL & MGMT  05/06/2019   IR RADIOLOGIST EVAL & MGMT  10/06/2019   IR RADIOLOGIST EVAL & MGMT  10/31/2020   IR RADIOLOGIST EVAL & MGMT  01/18/2021   IR RADIOLOGIST EVAL & MGMT  03/08/2021   IR RADIOLOGIST EVAL & MGMT  06/13/2021   KIDNEY SURGERY Left    LEFT AND RIGHT HEART CATHETERIZATION WITH CORONARY ANGIOGRAM N/A 10/27/2012   Procedure: LEFT AND RIGHT HEART CATHETERIZATION WITH CORONARY ANGIOGRAM;   Surgeon: Jessica Morn, MD;  Location: [facility] CATH LAB;  Service: Cardiovascular;  Laterality: N/A;   LUMBAR DISC SURGERY  1980's X2;  2000's   MAZE N/A 08/22/2015   Procedure: MAZE;  Surgeon: Heriberto London, MD;  Location: [facility] OR;  Service: Open Heart Surgery;  Laterality: N/A;   PACEMAKER INSERTION  03/31/2012   STJ Accent DR pacemaker implanted by Dr Nunzio Belch   PERMANENT PACEMAKER INSERTION N/A 03/31/2012   Procedure: PERMANENT PACEMAKER INSERTION;  Surgeon: Jolly Needle, MD;  Location: [facility] CATH LAB;  Service: Cardiovascular;  Laterality: N/A;   PPM GENERATOR CHANGEOUT N/A 08/08/2020   Procedure: PPM GENERATOR CHANGEOUT;  Surgeon: Jolly Needle, MD;  Location: [facility] INVASIVE CV LAB;  Service: Cardiovascular;  Laterality: N/A;   RADIOLOGY WITH ANESTHESIA Left 02/14/2021   Procedure: RADIOLOGY WITH ANESTHESIA LEFT RENAL CRYOABLATION;  Surgeon: Erica Hau, MD;  Location: [facility] ORS;  Service: Radiology;  Laterality: Left;   STUMP REVISION Right 10/09/2022   Procedure: REVISION RIGHT BELOW KNEE AMPUTATION;  Surgeon: Timothy Ford, MD;  Location: [facility] OR;  Service: Orthopedics;  Laterality: Right;   STUMP REVISION Right 11/16/2022   Procedure: REVISION RIGHT BELOW KNEE AMPUTATION;  Surgeon: [name], MD;  Location: [facility] OR;  Service: Orthopedics;  Laterality: Right;   TEE WITHOUT CARDIOVERSION  01/30/2012   Procedure: TRANSESOPHAGEAL ECHOCARDIOGRAM (TEE);  Surgeon: Laurey Morale, MD;  Location: [facility] ENDOSCOPY;  Service: Cardiovascular;  Laterality: N/A;  ablation next day   TEE WITHOUT CARDIOVERSION N/A 08/22/2015   Procedure: TRANSESOPHAGEAL ECHOCARDIOGRAM (TEE);  Surgeon: Kerin Perna, MD;  Location: [facility] OR;  Service: Open Heart Surgery;  Laterality: N/A;   TEE WITHOUT CARDIOVERSION N/A 10/19/2019   Procedure: TRANSESOPHAGEAL ECHOCARDIOGRAM (TEE);  Surgeon: Dolores Patty, MD;  Location: [facility] ENDOSCOPY;  Service: Cardiovascular;  Laterality: N/A;   US ECHOCARDIOGRAPHY  08/07/2009   EF 55-60%    Patient Active Problem List   Diagnosis Date Noted   AKI (acute kidney injury) (HCC) 12/22/2022   Fall 11/14/2022   Pain of right lower extremity due to injury 11/14/2022   Diabetic ulcer of lower leg (HCC) 11/13/2022   Osteomyelitis (HCC) 11/13/2022   Dyslipidemia 10/18/2022   Cellulitis of right lower extremity 10/17/2022   Dehiscence of amputation stump of right lower extremity (HCC) 10/09/2022   Below-knee amputation of right lower extremity (HCC) 10/09/2022   Gangrene of right foot (HCC) 09/04/2022   History of below-knee amputation of right lower extremity (HCC) 09/04/2022   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) 08/23/2022   CAP (community acquired pneumonia) 12/28/2021   Constipation 12/28/2021   Pain in both lower extremities 07/03/2021   Peripheral neuropathy 07/03/2021   Acute metabolic encephalopathy 06/09/2021   Marijuana dependence (HCC) 06/09/2021   DNR (do not resuscitate) 06/09/2021   Chest pain 06/08/2021   Renal lesion 02/14/2021   Status post cryoablation 02/14/2021   Allergic rhinitis 12/08/2020   Preop pulmonary/respiratory exam 03/27/2020   Acute on chronic diastolic (congestive) heart failure (HCC) 01/22/2020   Abdominal pain 01/22/2020   Hypothyroidism 12/13/2019   DM2 (diabetes mellitus, type 2) (HCC) 12/13/2019   Chronic kidney disease, stage 3b (HCC) 12/13/2019   Acute bacterial bronchitis 12/13/2019   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   Typical atrial flutter (HCC)    Chronic respiratory failure with hypoxia (HCC) 05/23/2016   CAD (coronary artery disease) 05/23/2016   Persistent atrial fibrillation (HCC)    Atypical atrial flutter (HCC)    Visit for monitoring Tikosyn therapy 04/16/2016   Chronic diastolic CHF (congestive heart failure) (HCC) 02/03/2016   Cough 12/06/2015   Acute on chronic respiratory failure with hypoxia (HCC) 09/08/2015   Hallucinations 09/04/2015   Lymphadenopathy 09/04/2015   Ascending aortic aneurysm (HCC)  09/04/2015   Hypoxia 09/02/2015   S/P aortic valve replacement with bioprosthetic valve 09/02/2015   Pleural effusion 09/02/2015   S/P AVR 08/22/2015   GERD without esophagitis 09/20/2014   Intrinsic asthma 10/13/2013   Dyspnea 09/15/2013   Pulmonary hypertension (HCC) 09/15/2013   Shortness of breath 05/05/2013   Pacemaker-St.Jude 04/01/2012   Bradycardia 03/31/2012   Second degree AV block 03/31/2012   AV block, 1st degree 02/01/2012   PAF (paroxysmal atrial fibrillation) (HCC) 11/13/2011   Fatigue 11/13/2011   Essential hypertension 11/13/2011   Aortic stenosis 11/13/2011   Sleep apnea 11/13/2011    PCP: Alysia Penna, MD  REFERRING PROVIDER: Aldean Baker, MD  ONSET DATE: 04/01/2023 prosthesis delivery  REFERRING DIAG: Z89.511 (ICD-10-CM) - S/P BKA (below knee amputation), right   THERAPY DIAG:  No  diagnosis found.  Rationale for Evaluation and Treatment: Rehabilitation  SUBJECTIVE:   SUBJECTIVE STATEMENT: ***   He tried positioning arms in his recliner and it helped some.  [place].    PERTINENT HISTORY:  right TTA 09/04/22, DM2, HLD, asthma, COPD, A-Fib, CAD, pacemaker, cardioversion & catheterization, arthritis, Fibromyalgia, GERD, HTN,   PAIN:  Are you having pain? Yes: NPRS scale:  today *** 10/10 Right leg, 8/10 left leg  and in last week down lower both legs Pain location: both legs Pain description: neuropathy, sharp, sticking "big" needles in leg Aggravating factors: meds wearing off,  standing & walking Relieving factors: pain pill 2x/day  Residual limb prosthesis on limb 0/10 - 10/10,  stand or walk 5/10 - 10/10  PRECAUTIONS: Fall and ICD/Pacemaker  WEIGHT BEARING RESTRICTIONS: No  FALLS: Has patient fallen in last 6 months? Yes. Number of falls 1 no injuries  LIVING ENVIRONMENT: Lives with: lives with their spouse and 3 dogs (mini schnauzer 15# indoor & 2 german [place] outdoor)   Lives in: [place] Home Access: Ramped entrance Home layout: One level Stairs: Yes: External: 6 steps; can reach both ramp at primary entrance Has following equipment at home: Single point cane, Environmental consultant - 2 wheeled, Wheelchair (manual), shower chair, Tour manager, and Ramped entry  OCCUPATION: retired from [employer]  PLOF: Independent, Independent with household mobility without device, and Independent with community mobility without device used oxygen as needed  PATIENT GOALS:  use prosthesis to function in community & in house, do odd repairs, yard work  OBJECTIVE:  COGNITION: Overall cognitive status: Within functional limits for tasks assessed   SENSATION: WFL  CARDIOVASCULAR RESPONSE: Functional activity: balance and gait assessments Pre-activity vitals: no oxygen seated in w/c HR: 67 SpO2: 93 Post-activity vitals: Berg with no oxygen  HR: 78 SpO2: 90-91%  94% on oxygen at rest, HR 70 After gait with O2 SpO2 94%, HR 73  Modified Borg scale for dyspnea: 2: mild shortness of breath  POSTURE: rounded shoulders, forward head, flexed trunk , and weight shift left  LOWER EXTREMITY ROM:  ROM P:passive  A:active Right eval  Hip flexion   Hip extension standing -10*   Hip abduction   Hip adduction   Hip internal rotation   Hip external rotation   Knee flexion   Knee extension   Ankle dorsiflexion   Ankle plantarflexion   Ankle inversion   Ankle eversion    (Blank rows = not tested)  LOWER EXTREMITY MMT:  MMT Right eval Left eval  Hip flexion    Hip extension 3-4/5 3-4/5  Hip abduction 3-4/5 4/5  Hip adduction    Hip internal rotation    Hip external rotation    Knee flexion 3-4/5 4/5  Knee extension 3-4/5 4/5  Ankle dorsiflexion    Ankle plantarflexion    Ankle inversion    Ankle eversion    At Evaluation all strength testing is grossly seated and functionally standing / gait. (Blank rows = not tested)  TRANSFERS: Evaluation on 06/03/2023:     Sit to stand: SBA from 20" w/c using BUEs on armrests to arise stabilizes with back of legs against w/c Stand to sit: SBA uses BUEs on armrests of w/c to control descent  FUNCTIONAL TESTs:  Evaluation on 06/03/2023:   Berg Balance Scale: 19/56  GAIT: Evaluation on 06/03/2023:    Gait pattern: step to pattern, decreased step length- Left, decreased stance time-  Right, decreased hip/knee flexion- Right, decreased ankle dorsiflexion- Right, Right hip hike, knee flexed in stance- Right, lateral hip instability, trunk flexed, and abducted- Right Distance walked: 25' Assistive device utilized: Environmental consultant - 2 wheeled and TTA prosthesis Level of assistance: Min A balance loss to left on straight path & 180* turn Comments: used O2 with portable unit slung over left shoulder,  pain in residual limb limited distance,   CURRENT PROSTHETIC WEAR ASSESSMENT: Evaluation on 06/03/2023:    Patient is dependent with: skin check, residual limb care, care of non-amputated limb, prosthetic cleaning, ply sock cleaning, correct ply sock adjustment, proper wear schedule/adjustment, and proper weight-bearing schedule/adjustment Donning prosthesis: dependent with PT 100% cues on technique Doffing prosthesis: reports modified independent Prosthetic wear tolerance: 2-4 hours, 1x/day, ~3 days/week Prosthetic weight bearing tolerance: 5 minutes with partial weight on prosthesis with pain increasing to 8/10 (intolerable) Edema: pitting with 7 sec capillary refill Residual limb condition: dry skin especially distal limb, 3 scratches with superficial scab on distal limb, scar healed but adhered over entire area of distal tibia, bulbous shape,  Prosthetic description: silicon liner with pin lock suspension, total contact socket with flexible inner socket, dynamic response foot K code/activity level with prosthetic use: Level 3    TODAY'S TREATMENT:                                                                                                                              DATE:  08/11/2023: Prosthetic Care with TTA prosthesis: P***   TREATMENT:                                                                                                                             DATE:  08/07/2023: Prosthetic Care with TTA prosthesis: PT educated pt & wife in socket revisions and replacement liners every 6 months.  He will need a socket revision ~09/30/23 but needs appt end of May with Dr. Julio Ohm to order it.  PT recommending appt with prosthetist now for pads in socket and dynamic alignment.  PT educated that pads will take up some of space that socks are doing now so will need less socks.  PT recommended using golf cart for long walk to family graveyard then rollator at grave side so he can sit as needed. Pt & wife agreed with recommendation.   Therapeutic Exercise: - Seated Hamstring Stretch with Strap  2 reps - 30 seconds  hold both LEs - Standing Gastroc Stretch on Step  2 reps - 30 seconds hold both LEs - Upright Stance at Door Frame Single Arm 2 reps - 2 deep breathes hold - Upright Stance at Door Frame with Both Arms 2 deep breathes hold PT instructed with HO, demo & verbal cues in above as HEP. Pt verbalized understanding including rationale to help with neuropathy pain.    TREATMENT:                                                                                                                             DATE:  08/04/2023: Prosthetic Care with TTA prosthesis: Patient ambulated in and out of clinic with rollator walker safely.  PT did give verbal cues on upright posture. Patient ambulated 30' x 2 with [facility] and min assistance.  PT demo and verbal cues on proper location of the cane and a slight step through pattern with LLE.  Patient requires HHA about halfway through both walks due to lower extremity fatigue.  Patient and wife verbalize how to properly give HHA as needed when walking. Pt & wife verbalize understanding of providing  HHA.   Self-care: Patient reports that his neck position especially while sitting and sleeping (sleeps in recliner) is beginning to limit his upper extremity strength and lower extremity strength.  PT demo scapular depression as he sits with exaggerated elevation which will trigger upper trapezius pain.  Patient return demonstration scapular depression exercises.  PT recommended positioning when sitting so that arm weight is supported by the armrest.  PT also discussed that soft collar & soft strap at forehead at night to prevent cervical flexion when sleeping may help. Patient and wife verbalized understanding.     PATIENT EDUCATION: PATIENT EDUCATED ON FOLLOWING PROSTHETIC CARE: Education details:  Skin check, Residual limb care, Prosthetic cleaning, Correct ply sock adjustment, Propper donning, and Proper wear schedule/adjustment Prosthetic wear tolerance: 2 hours 2x/day, 7 days/week Person educated: Patient and Spouse Education method: Explanation, Demonstration, Tactile cues, and Verbal cues Education comprehension: verbalized understanding, verbal cues required, tactile cues required, and needs further education  HOME EXERCISE PROGRAM: Access Code: [email or web address] URL: [email or web address] Date: 08/07/2023 Prepared by: Lorie Rook  Exercises - Seated Hamstring Stretch with Strap  - 1-2 x daily - 7 x weekly - 1 sets - 2-3 reps - 30 seconds hold - Standing Gastroc Stretch on Step  - 1-2 x daily - 7 x weekly - 1 sets - 2-3 reps - 30 seconds hold - Upright Stance at Door Frame Single Arm  - 1-3 x daily - 7 x weekly - 1 sets - 2 reps - 2 deep breathes hold - Upright Stance at Door Frame with Both Arms  - 1-3 x daily - 7 x weekly - 1 sets - 2 reps - 2 deep breathes hold   Printed and issued 06/25/23 Do each exercise 1-2  times per day Do each exercise 5-10 repetitions Hold each exercise  for 2 seconds to feel your location  AT [facility] FIND YOUR MIDLINE POSITION AND PLACE FEET  EQUAL DISTANCE FROM THE MIDLINE.  Try to find this position when standing still for activities.   USE TAPE ON FLOOR TO MARK THE MIDLINE POSITION which is even with middle of sink.  You also should try to feel with your limb pressure in socket.  You are trying to feel with limb what you used to feel with the bottom of your foot.  Side to Side Shift: Moving your hips only (not shoulders): move weight onto your left leg, HOLD/FEEL pressure in socket.  Move back to equal weight on each leg, HOLD/FEEL pressure in socket. Move weight onto your right leg, HOLD/FEEL pressure in socket. Move back to equal weight on each leg, HOLD/FEEL pressure in socket. Repeat.  Start with both hands on sink, progress to hand on prosthetic side only, then no hands.  Front to Back Shift: Moving your hips only (not shoulders): move your weight forward onto your toes, HOLD/FEEL pressure in socket. Move your weight back to equal Flat Foot on both legs, HOLD/FEEL  pressure in socket. Move your weight back onto your heels, HOLD/FEEL  pressure in socket. Move your weight back to equal on both legs, HOLD/FEEL  pressure in socket. Repeat.  Start with both hands on sink, progress to hand on prosthetic side only, then no hands.  Moving Cones / Cups: With equal weight on each leg: Hold on with one hand the first time, then progress to no hand supports. Move cups from one side of sink to the other. Place cups ~2" out of your reach, progress to 10" beyond reach.  Place one hand in middle of sink and reach with other hand. Do both arms.  Then hover one hand and move cups with other hand.  Overhead/Upward Reaching: alternated reaching up to top cabinets or ceiling if no cabinets present. Keep equal weight on each leg. Start with one hand support on counter while other hand reaches and progress to no hand support with reaching.  ace one hand in middle of sink and reach with other hand. Do both arms.  Then hover one hand and move cups with other  hand.  5.   Looking Over Shoulders: With equal weight on each leg: alternate turning to look over your shoulders with one hand support on counter as needed.  Start with head motions only to look in front of shoulder, then even with shoulder and progress to looking behind you. To look to side, move head /eyes, then shoulder on side looking pulls back, shift more weight to side looking and pull hip back. Place one hand in middle of sink and let go with other hand so your shoulder can pull back. Switch hands to look other way.   Then hover one hand and look over shoulder. If looking right, use left hand at sink. If looking left, use right hand at sink. 6.  Stepping with leg that is not amputated:  Move items under cabinet out of your way. Shift your hips/pelvis so weight on prosthesis. Tighten muscles in hip on prosthetic side.  SLOWLY step other leg so front of foot is in cabinet. Then step back to floor.   ASSESSMENT:  CLINICAL IMPRESSION: Patient appears to understand stretches instructed today.  His mobility is improving but limited by his chronic pain issues.  Patient continues to benefit from skilled physical therapy.    OBJECTIVE IMPAIRMENTS: Abnormal gait, cardiopulmonary status limiting  activity, decreased activity tolerance, decreased balance, decreased endurance, decreased knowledge of condition, decreased knowledge of use of DME, decreased mobility, difficulty walking, decreased ROM, decreased strength, increased edema, prosthetic dependency , and pain.   ACTIVITY LIMITATIONS: carrying, lifting, bending, standing, stairs, transfers, and locomotion level  PARTICIPATION LIMITATIONS: meal prep, cleaning, driving, community activity, and yard work  PERSONAL FACTORS: Fitness, Time since onset of injury/illness/exacerbation, and 3+ comorbidities: see PMH  are also affecting patient's functional outcome.   REHAB POTENTIAL: Good  CLINICAL DECISION MAKING: Evolving/moderate  complexity  EVALUATION COMPLEXITY: Moderate   GOALS: Goals reviewed with patient? Yes  SHORT TERM GOALS: Target date: 07/02/2023  Patient donnes prosthesis modified independent & verbalizes proper cleaning. Baseline: SEE OBJECTIVE DATA Goal status:   MET 06/30/2023 2.  Patient tolerates prosthesis >6 hrs total /day without skin issues or limb pain >5/10 after standing. Baseline: SEE OBJECTIVE DATA Goal status: Partially MET 06/30/2023  3.  Patient able to reach 5" and looks to sides without UE support with supervision. Baseline: SEE OBJECTIVE DATA Goal status: MET 07/07/2023  4. Patient ambulates 52' with RW & prosthesis with supervision. Baseline: SEE OBJECTIVE DATA Goal status: MET 07/07/2023  5. Patient negotiates ramps & curbs with RW & prosthesis with minA. Baseline: SEE OBJECTIVE DATA Goal status: MET 06/23/2023  LONG TERM GOALS: Target date: 08/28/2023  Patient demonstrates & verbalized understanding of prosthetic care to enable safe utilization of prosthesis. Baseline: SEE OBJECTIVE DATA Goal status:   Ongoing   08/11/2023  Patient tolerates prosthesis wear >90% of awake hours without skin or limb pain issues. Baseline: SEE OBJECTIVE DATA Goal status: Ongoing   08/11/2023  Berg Balance >36/56 to indicate lower fall risk Baseline: SEE OBJECTIVE DATA Goal status: Ongoing   08/11/2023  Patient ambulates >300' with prosthesis & LRAD independently Baseline: SEE OBJECTIVE DATA Goal status: Ongoing   08/11/2023  Patient negotiates ramps, curbs & stairs with single rail with prosthesis & LRAD independently. Baseline: SEE OBJECTIVE DATA Goal status: Ongoing  08/11/2023   PLAN:  PT FREQUENCY: 2x/week  PT DURATION: 12 weeks  PLANNED INTERVENTIONS: 97164- PT Re-evaluation, 97110-Therapeutic exercises, 97530- Therapeutic activity, 97112- Neuromuscular re-education, [phone]- Self Care, 93716- Gait training, [phone]- Prosthetic training, Patient/Family education, Balance training,  Stair training, Scar mobilization, Vestibular training, DME instructions, and Physical Performance Testing  PLAN FOR NEXT SESSION:  ***  Check on stretches.  Continue ambulation with SBQC, continue to progress ambulation tolerance as patient is fit, standing balance activities.    Date of referral: 04/17/2023  PT eval delayed by COPD exacerbation Referring provider: Aldean Baker, MD Referring diagnosis? Z89.511 (ICD-10-CM) - S/P BKA (below knee amputation), right Treatment diagnosis? (if different than referring diagnosis)   Other abnormalities of gait & mobility R26.89 Unsteadiness on feet R26.81 Pain in left leg M79.605 Pain in right leg M79.604 Muscle Weakness (generalized) M62.81 History of falling Z91.81 Impaired functional mobility, balance, gait and endurance  What was this (referring dx) caused by? Surgery (Type: right BKA)  Nature of Condition: Initial Onset (within last 3 months)  prosthesis delivery 04/01/2023   Laterality: Rt  Current Functional Measure Score: Other Berg Balance 19/56  Objective measurements identify impairments when they are compared to normal values, the uninvolved extremity, and prior level of function.  [x]  Yes  []  No  Objective assessment of functional ability: Severe functional limitations   Briefly describe symptoms: dependent in care & use of prosthesis, impaired balance and gait, pain in both legs  How did symptoms start: amputation from wounds  Average pain intensity:  Last 24 hours: 8/10  Past week: 8/10  How often does the pt experience symptoms? Constantly  How much have the symptoms interfered with usual daily activities? Extremely  How has condition changed since care began at this facility? NA - initial visit  In general, how is the patients overall health? Good   BACK PAIN (STarT Back Screening Tool) No   Lorie Rook, PT, DPT 08/11/2023, 7:13 AM

## 2023-08-12 ENCOUNTER — Encounter: Payer: Self-pay | Admitting: Cardiovascular Disease

## 2023-08-13 ENCOUNTER — Encounter: Payer: Self-pay | Admitting: Physical Therapy

## 2023-08-13 ENCOUNTER — Ambulatory Visit: Admitting: Physical Therapy

## 2023-08-13 DIAGNOSIS — R2689 Other abnormalities of gait and mobility: Secondary | ICD-10-CM | POA: Diagnosis not present

## 2023-08-13 DIAGNOSIS — M79605 Pain in left leg: Secondary | ICD-10-CM | POA: Diagnosis not present

## 2023-08-13 DIAGNOSIS — R2681 Unsteadiness on feet: Secondary | ICD-10-CM | POA: Diagnosis not present

## 2023-08-13 DIAGNOSIS — M79604 Pain in right leg: Secondary | ICD-10-CM | POA: Diagnosis not present

## 2023-08-13 DIAGNOSIS — M6281 Muscle weakness (generalized): Secondary | ICD-10-CM

## 2023-08-13 DIAGNOSIS — Z7409 Other reduced mobility: Secondary | ICD-10-CM

## 2023-08-13 NOTE — Therapy (Signed)
 OUTPATIENT PHYSICAL THERAPY PROSTHETIC TREATMENT   Patient Name: [name] MRN: [id number] DOB:04/13/1951, 73 y.o., male Today's Date: 08/13/2023  END OF SESSION:  PT End of Session - 08/13/23 1147     Visit Number 15    Number of Visits 25    Date for PT Re-Evaluation 08/28/23    Authorization Type [employer] Medicare    Authorization Time Period [employer] MEDICARE $20 COPAY 16 PT VISITS APPROVED 2/4-5/27/25    Authorization - Visit Number 15    Authorization - Number of Visits 16    Progress Note Due on Visit 20    PT Start Time 1145    PT Stop Time 1223    PT Time Calculation (min) 38 min    Equipment Utilized During Treatment Gait belt    Activity Tolerance Patient tolerated treatment well;Patient limited by pain;Patient limited by fatigue    Behavior During Therapy [facility] for tasks assessed/performed                        Past Medical History:  Diagnosis Date   Aortic stenosis, mild    Arthritis of hand    "just a little bit in both hands" (03/31/2012)   Asthma    "little bit" (03/31/2012)   Atrial fibrillation (HCC)    dx '04; DCCV '04, placed on flecainide, failed DCCV 04/2010, flecainide stopped; s/p successful A.fib ablation 01/31/12   CHF (congestive heart failure) (HCC)    Controlled type 2 diabetes mellitus without complication, without long-term current use of insulin (HCC) 12/13/2019   COPD (chronic obstructive pulmonary disease) (HCC)    DJD (degenerative joint disease)    Fatty liver    Fibromyalgia    GERD (gastroesophageal reflux disease)    Hyperlipidemia    Hypertension    Hypothyroidism    S/P radiation   Left atrial enlargement    LA size 45mm by echo 11/21/11   Mitral regurgitation    trivial   Obstructive sleep apnea    mild by sleep study 2013; pt stated he does not have a machine because "it wasn't bad enough for him to have one"   Pacemaker 03/31/2012   Pneumonia    last time 2023   Restless leg syndrome    Second degree AV block     Splenomegaly    Past Surgical History:  Procedure Laterality Date   AMPUTATION Right 08/23/2022   Procedure: RIGHT FOOT FIFTH RAY AMPUTATION;  Surgeon: [name], MD;  Location: [facility] OR;  Service: Orthopedics;  Laterality: Right;   AMPUTATION Right 09/04/2022   Procedure: RIGHT BELOW KNEE AMPUTATION;  Surgeon: [name], MD;  Location: [facility] OR;  Service: Orthopedics;  Laterality: Right;   AORTIC VALVE REPLACEMENT N/A 08/22/2015   Procedure: AORTIC VALVE REPLACEMENT (AVR);  Surgeon: Kerin Perna, MD;  Location: [facility] OR;  Service: Open Heart Surgery;  Laterality: N/A;   ATRIAL FIBRILLATION ABLATION  01/30/2012   PVI by Dr. Johney Frame   ATRIAL FIBRILLATION ABLATION N/A 01/31/2012   Procedure: ATRIAL FIBRILLATION ABLATION;  Surgeon: [name], MD;  Location: [facility] CATH LAB;  Service: Cardiovascular;  Laterality: N/A;   ATRIAL FIBRILLATION ABLATION N/A 04/19/2020   Procedure: ATRIAL FIBRILLATION ABLATION;  Surgeon: [name], MD;  Location: [facility] INVASIVE CV LAB;  Service: Cardiovascular;  Laterality: N/A;   BACK SURGERY     X 3   BIOPSY  01/01/2022   Procedure: BIOPSY;  Surgeon: Kathi Der, MD;  Location: [facility]  ENDOSCOPY;  Service: Gastroenterology;;   CARDIAC CATHETERIZATION N/A 08/09/2015   Procedure: Right/Left Heart Cath and Coronary Angiography;  Surgeon: Peter M Swaziland, MD;  Location: [facility] INVASIVE CV LAB;  Service: Cardiovascular;  Laterality: N/A;   CARDIAC CATHETERIZATION N/A 02/05/2016   Procedure: Right/Left Heart Cath and Coronary Angiography;  Surgeon: Corky Crafts, MD;  Location: [facility] INVASIVE CV LAB;  Service: Cardiovascular;  Laterality: N/A;   CARDIAC CATHETERIZATION N/A 04/17/2016   Procedure: Right Heart Cath;  Surgeon: Dolores Patty, MD;  Location: [facility] INVASIVE CV LAB;  Service: Cardiovascular;  Laterality: N/A;   CARDIOVASCULAR STRESS TEST  03/12/2002   EF 48%, NO EVIDENCE OF ISCHEMIA   CARDIOVERSION  01/2012; 03/31/2012   CARDIOVERSION N/A 02/25/2012    Procedure: CARDIOVERSION;  Surgeon: [name], MD;  Location: [facility] CATH LAB;  Service: Cardiovascular;  Laterality: N/A;   CARDIOVERSION N/A 03/31/2012   Procedure: CARDIOVERSION;  Surgeon: [name], MD;  Location: [facility] CATH LAB;  Service: Cardiovascular;  Laterality: N/A;   CARDIOVERSION N/A 11/23/2015   Procedure: CARDIOVERSION;  Surgeon: Laurey Morale, MD;  Location: [facility] ENDOSCOPY;  Service: Cardiovascular;  Laterality: N/A;   CARDIOVERSION N/A 04/08/2016   Procedure: CARDIOVERSION;  Surgeon: Dolores Patty, MD;  Location: [facility] ENDOSCOPY;  Service: Cardiovascular;  Laterality: N/A;   CARDIOVERSION N/A 09/14/2018   Procedure: CARDIOVERSION;  Surgeon: Wendall Stade, MD;  Location: [facility] ENDOSCOPY;  Service: Cardiovascular;  Laterality: N/A;   CARDIOVERSION N/A 10/19/2019   Procedure: CARDIOVERSION;  Surgeon: Dolores Patty, MD;  Location: [facility] ENDOSCOPY;  Service: Cardiovascular;  Laterality: N/A;   CARDIOVERSION N/A 02/25/2020   Procedure: CARDIOVERSION;  Surgeon: Pricilla Riffle, MD;  Location: [facility] ENDOSCOPY;  Service: Cardiovascular;  Laterality: N/A;   CARDIOVERSION N/A 05/25/2020   Procedure: CARDIOVERSION;  Surgeon: Lewayne Bunting, MD;  Location: [facility] ENDOSCOPY;  Service: Cardiovascular;  Laterality: N/A;   CARDIOVERSION N/A 07/10/2020   Procedure: CARDIOVERSION;  Surgeon: Jake Bathe, MD;  Location: [facility] ENDOSCOPY;  Service: Cardiovascular;  Laterality: N/A;   CLIPPING OF ATRIAL APPENDAGE N/A 08/22/2015   Procedure: CLIPPING OF ATRIAL APPENDAGE;  Surgeon: Kerin Perna, MD;  Location: [facility] OR;  Service: Open Heart Surgery;  Laterality: N/A;   COLONOSCOPY WITH PROPOFOL N/A 01/01/2022   Procedure: COLONOSCOPY WITH PROPOFOL;  Surgeon: Kathi Der, MD;  Location: [facility] ENDOSCOPY;  Service: Gastroenterology;  Laterality: N/A;   DOPPLER ECHOCARDIOGRAPHY  03/11/2002   EF 70-75%   FINGER SURGERY Left    Middle finger   FINGER TENDON REPAIR  1980's   "right little finger" (03/31/2012)    FLEXIBLE SIGMOIDOSCOPY N/A 12/30/2021   Procedure: FLEXIBLE SIGMOIDOSCOPY;  Surgeon: Charlott Rakes, MD;  Location: [facility] ENDOSCOPY;  Service: Gastroenterology;  Laterality: N/A;   INSERT / REPLACE / REMOVE PACEMAKER     St Jude   IR RADIOLOGIST EVAL & MGMT  05/06/2019   IR RADIOLOGIST EVAL & MGMT  10/06/2019   IR RADIOLOGIST EVAL & MGMT  10/31/2020   IR RADIOLOGIST EVAL & MGMT  01/18/2021   IR RADIOLOGIST EVAL & MGMT  03/08/2021   IR RADIOLOGIST EVAL & MGMT  06/13/2021   KIDNEY SURGERY Left    LEFT AND RIGHT HEART CATHETERIZATION WITH CORONARY ANGIOGRAM N/A 10/27/2012   Procedure: LEFT AND RIGHT HEART CATHETERIZATION WITH CORONARY ANGIOGRAM;  Surgeon: Pamella Pert, MD;  Location: [facility] CATH LAB;  Service: Cardiovascular;  Laterality: N/A;   LUMBAR DISC SURGERY  1980's X2;  2000's   MAZE N/A 08/22/2015   Procedure:  MAZE;  Surgeon: Kerin Perna, MD;  Location: [facility] OR;  Service: Open Heart Surgery;  Laterality: N/A;   PACEMAKER INSERTION  03/31/2012   STJ Accent DR pacemaker implanted by Dr Johney Frame   PERMANENT PACEMAKER INSERTION N/A 03/31/2012   Procedure: PERMANENT PACEMAKER INSERTION;  Surgeon: [name], MD;  Location: [facility] CATH LAB;  Service: Cardiovascular;  Laterality: N/A;   PPM GENERATOR CHANGEOUT N/A 08/08/2020   Procedure: PPM GENERATOR CHANGEOUT;  Surgeon: [name], MD;  Location: [facility] INVASIVE CV LAB;  Service: Cardiovascular;  Laterality: N/A;   RADIOLOGY WITH ANESTHESIA Left 02/14/2021   Procedure: RADIOLOGY WITH ANESTHESIA LEFT RENAL CRYOABLATION;  Surgeon: Irish Lack, MD;  Location: [facility] ORS;  Service: Radiology;  Laterality: Left;   STUMP REVISION Right 10/09/2022   Procedure: REVISION RIGHT BELOW KNEE AMPUTATION;  Surgeon: [name], MD;  Location: [facility] OR;  Service: Orthopedics;  Laterality: Right;   STUMP REVISION Right 11/16/2022   Procedure: REVISION RIGHT BELOW KNEE AMPUTATION;  Surgeon: [name], MD;  Location: [facility] OR;  Service: Orthopedics;   Laterality: Right;   TEE WITHOUT CARDIOVERSION  01/30/2012   Procedure: TRANSESOPHAGEAL ECHOCARDIOGRAM (TEE);  Surgeon: Laurey Morale, MD;  Location: [facility] ENDOSCOPY;  Service: Cardiovascular;  Laterality: N/A;  ablation next day   TEE WITHOUT CARDIOVERSION N/A 08/22/2015   Procedure: TRANSESOPHAGEAL ECHOCARDIOGRAM (TEE);  Surgeon: Kerin Perna, MD;  Location: [facility] OR;  Service: Open Heart Surgery;  Laterality: N/A;   TEE WITHOUT CARDIOVERSION N/A 10/19/2019   Procedure: TRANSESOPHAGEAL ECHOCARDIOGRAM (TEE);  Surgeon: Dolores Patty, MD;  Location: [facility] ENDOSCOPY;  Service: Cardiovascular;  Laterality: N/A;   US ECHOCARDIOGRAPHY  08/07/2009   EF 55-60%   Patient Active Problem List   Diagnosis Date Noted   AKI (acute kidney injury) (HCC) 12/22/2022   Fall 11/14/2022   Pain of right lower extremity due to injury 11/14/2022   Diabetic ulcer of lower leg (HCC) 11/13/2022   Osteomyelitis (HCC) 11/13/2022   Dyslipidemia 10/18/2022   Cellulitis of right lower extremity 10/17/2022   Dehiscence of amputation stump of right lower extremity (HCC) 10/09/2022   Below-knee amputation of right lower extremity (HCC) 10/09/2022   Gangrene of right foot (HCC) 09/04/2022   History of below-knee amputation of right lower extremity (HCC) 09/04/2022   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) 08/23/2022   CAP (community acquired pneumonia) 12/28/2021   Constipation 12/28/2021   Pain in both lower extremities 07/03/2021   Peripheral neuropathy 07/03/2021   Acute metabolic encephalopathy 06/09/2021   Marijuana dependence (HCC) 06/09/2021   DNR (do not resuscitate) 06/09/2021   Chest pain 06/08/2021   Renal lesion 02/14/2021   Status post cryoablation 02/14/2021   Allergic rhinitis 12/08/2020   Preop pulmonary/respiratory exam 03/27/2020   Acute on chronic diastolic (congestive) heart failure (HCC) 01/22/2020   Abdominal pain 01/22/2020   Hypothyroidism 12/13/2019   DM2 (diabetes mellitus,  type 2) (HCC) 12/13/2019   Chronic kidney disease, stage 3b (HCC) 12/13/2019   Acute bacterial bronchitis 12/13/2019   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   Typical atrial flutter (HCC)    Chronic respiratory failure with hypoxia (HCC) 05/23/2016   CAD (coronary artery disease) 05/23/2016   Persistent atrial fibrillation (HCC)    Atypical atrial flutter (HCC)    Visit for monitoring Tikosyn therapy 04/16/2016   Chronic diastolic CHF (congestive heart failure) (HCC) 02/03/2016   Cough 12/06/2015   Acute on chronic respiratory failure with hypoxia (HCC) 09/08/2015   Hallucinations 09/04/2015  Lymphadenopathy 09/04/2015   Ascending aortic aneurysm (HCC) 09/04/2015   Hypoxia 09/02/2015   S/P aortic valve replacement with bioprosthetic valve 09/02/2015   Pleural effusion 09/02/2015   S/P AVR 08/22/2015   GERD without esophagitis 09/20/2014   Intrinsic asthma 10/13/2013   Dyspnea 09/15/2013   Pulmonary hypertension (HCC) 09/15/2013   Shortness of breath 05/05/2013   Pacemaker-St.Jude 04/01/2012   Bradycardia 03/31/2012   Second degree AV block 03/31/2012   AV block, 1st degree 02/01/2012   PAF (paroxysmal atrial fibrillation) (HCC) 11/13/2011   Fatigue 11/13/2011   Essential hypertension 11/13/2011   Aortic stenosis 11/13/2011   Sleep apnea 11/13/2011    PCP: Alysia Penna, MD  REFERRING PROVIDER: Aldean Baker, MD  ONSET DATE: 04/01/2023 prosthesis delivery  REFERRING DIAG: Z89.511 (ICD-10-CM) - S/P BKA (below knee amputation), right   THERAPY DIAG:  Other abnormalities of gait and mobility  Unsteadiness on feet  Pain in right leg  Pain in left leg  Muscle weakness (generalized)  Impaired functional mobility, balance, gait, and endurance  Rationale for Evaluation and Treatment: Rehabilitation  SUBJECTIVE:   SUBJECTIVE STATEMENT: He went to brother-n-law's funeral in [place] and amount of walking / standing spiked his pain.  He did stretches and they seem to  help.    PERTINENT HISTORY:  right TTA 09/04/22, DM2, HLD, asthma, COPD, A-Fib, CAD, pacemaker, cardioversion & catheterization, arthritis, Fibromyalgia, GERD, HTN,   PAIN:  Are you having pain? Yes: NPRS scale:  today 10/10 Right leg, 6/10 left leg  and in last week down lower both legs Pain location: both legs Pain description: neuropathy, sharp, sticking "big" needles in leg Aggravating factors: meds wearing off,  standing & walking Relieving factors: pain pill 2x/day  Residual limb prosthesis on limb 0/10 - 10/10,  stand or walk 5/10 - 10/10  PRECAUTIONS: Fall and ICD/Pacemaker  WEIGHT BEARING RESTRICTIONS: No  FALLS: Has patient fallen in last 6 months? Yes. Number of falls 1 no injuries  LIVING ENVIRONMENT: Lives with: lives with their spouse and 3 dogs (mini schnauzer 15# indoor & 2 german [place] outdoor)  Lives in: [place] Home Access: Ramped entrance Home layout: One level Stairs: Yes: External: 6 steps; can reach both ramp at primary entrance Has following equipment at home: Single point cane, Environmental consultant - 2 wheeled, Wheelchair (manual), shower chair, Tour manager, and Ramped entry  OCCUPATION: retired from [employer]  PLOF: Independent, Independent with household mobility without device, and Independent with community mobility without device used oxygen as needed  PATIENT GOALS:  use prosthesis to function in community & in house, do odd repairs, yard work  OBJECTIVE:  COGNITION: Overall cognitive status: Within functional limits for tasks assessed   SENSATION: WFL  CARDIOVASCULAR RESPONSE: Functional activity: balance and gait assessments Pre-activity vitals: no oxygen seated in w/c HR: 67 SpO2: 93 Post-activity vitals: Berg with no oxygen  HR: 78 SpO2: 90-91%  94% on oxygen at rest, HR 70 After gait with O2 SpO2 94%, HR 73  Modified Borg scale for dyspnea: 2: mild shortness of breath  POSTURE: rounded shoulders, forward head, flexed trunk , and weight  shift left  LOWER EXTREMITY ROM:  ROM P:passive  A:active Right eval  Hip flexion   Hip extension standing -10*   Hip abduction   Hip adduction   Hip internal rotation   Hip external rotation   Knee flexion   Knee extension   Ankle dorsiflexion   Ankle plantarflexion   Ankle inversion   Ankle eversion    (  Blank rows = not tested)  LOWER EXTREMITY MMT:  MMT Right eval Left eval  Hip flexion    Hip extension 3-4/5 3-4/5  Hip abduction 3-4/5 4/5  Hip adduction    Hip internal rotation    Hip external rotation    Knee flexion 3-4/5 4/5  Knee extension 3-4/5 4/5  Ankle dorsiflexion    Ankle plantarflexion    Ankle inversion    Ankle eversion    At Evaluation all strength testing is grossly seated and functionally standing / gait. (Blank rows = not tested)  TRANSFERS: Evaluation on 06/03/2023:    Sit to stand: SBA from 20" w/c using BUEs on armrests to arise stabilizes with back of legs against w/c Stand to sit: SBA uses BUEs on armrests of w/c to control descent  FUNCTIONAL TESTs:  Evaluation on 06/03/2023:   Berg Balance Scale: 19/56  GAIT: Evaluation on 06/03/2023:    Gait pattern: step to pattern, decreased step length- Left, decreased stance time- Right, decreased hip/knee flexion- Right, decreased ankle dorsiflexion- Right, Right hip hike, knee flexed in stance- Right, lateral hip instability, trunk flexed, and abducted- Right Distance walked: 25' Assistive device utilized: Environmental consultant - 2 wheeled and TTA prosthesis Level of assistance: Min A balance loss to left on straight path & 180* turn Comments: used O2 with portable unit slung over left shoulder,  pain in residual limb limited distance,   CURRENT PROSTHETIC WEAR ASSESSMENT: Evaluation on 06/03/2023:    Patient is dependent with: skin check, residual limb care, care of non-amputated limb, prosthetic cleaning, ply sock cleaning, correct ply sock adjustment, proper wear schedule/adjustment, and proper weight-bearing  schedule/adjustment Donning prosthesis: dependent with PT 100% cues on technique Doffing prosthesis: reports modified independent Prosthetic wear tolerance: 2-4 hours, 1x/day, ~3 days/week Prosthetic weight bearing tolerance: 5 minutes with partial weight on prosthesis with pain increasing to 8/10 (intolerable) Edema: pitting with 7 sec capillary refill Residual limb condition: dry skin especially distal limb, 3 scratches with superficial scab on distal limb, scar healed but adhered over entire area of distal tibia, bulbous shape,  Prosthetic description: silicon liner with pin lock suspension, total contact socket with flexible inner socket, dynamic response foot K code/activity level with prosthetic use: Level 3    TODAY'S TREATMENT:                                                                                                                             DATE:  08/13/2023: Prosthetic Care with TTA prosthesis: Pt amb 25' with [facility] requiring HHA after 5' due to pain & weakness.   PT verbally educated pt & wife on need to see MD ASAP due to wounds on LLE.  If he is told to limit WBing or ends up in the future with left leg amputation, he needs to continue to wear right BKA prosthesis to prevent falls from w/c and improve transfers. Pt & wife verbalized understanding.   Therapeutic Exercise: Seated at edge  of chair without back support:   - Seated Hip Flexion Toward Target 5 reps - 5 seconds hold - Seated Eccentric Abdominal Lean Back  - 5 reps - 5 seconds hold - Seated Sidebending  5 reps - 5 seconds hold - Seated Trunk Rotation with Crossed Arms  5 reps - 5 seconds hold -seated LE ext stepping out & back alternating LEs 5 reps - Standing Hip Extension  5 reps - 5 seconds hold - Standing Hip Abduction 5 reps - 5 seconds hold PT added to HEP with HO, demo & verbal cues.  Pt verbalized understanding after performing during session.     TREATMENT:                                                                                                                              DATE:  08/07/2023: Prosthetic Care with TTA prosthesis: PT educated pt & wife in socket revisions and replacement liners every 6 months.  He will need a socket revision ~09/30/23 but needs appt end of May with Dr. Julio Ohm to order it.  PT recommending appt with prosthetist now for pads in socket and dynamic alignment.  PT educated that pads will take up some of space that socks are doing now so will need less socks.  PT recommended using golf cart for long walk to family graveyard then rollator at grave side so he can sit as needed. Pt & wife agreed with recommendation.   Therapeutic Exercise: - Seated Hamstring Stretch with Strap  2 reps - 30 seconds hold both LEs - Standing Gastroc Stretch on Step  2 reps - 30 seconds hold both LEs - Upright Stance at Door Frame Single Arm 2 reps - 2 deep breathes hold - Upright Stance at Door Frame with Both Arms 2 deep breathes hold PT instructed with HO, demo & verbal cues in above as HEP. Pt verbalized understanding including rationale to help with neuropathy pain.    TREATMENT:                                                                                                                             DATE:  08/04/2023: Prosthetic Care with TTA prosthesis: Patient ambulated in and out of clinic with rollator walker safely.  PT did give verbal cues on upright posture. Patient ambulated 30' x 2 with [facility] and min assistance.  PT demo and verbal cues on  proper location of the cane and a slight step through pattern with LLE.  Patient requires HHA about halfway through both walks due to lower extremity fatigue.  Patient and wife verbalize how to properly give HHA as needed when walking. Pt & wife verbalize understanding of providing HHA.   Self-care: Patient reports that his neck position especially while sitting and sleeping (sleeps in recliner) is beginning to limit his upper extremity  strength and lower extremity strength.  PT demo scapular depression as he sits with exaggerated elevation which will trigger upper trapezius pain.  Patient return demonstration scapular depression exercises.  PT recommended positioning when sitting so that arm weight is supported by the armrest.  PT also discussed that soft collar & soft strap at forehead at night to prevent cervical flexion when sleeping may help. Patient and wife verbalized understanding.     PATIENT EDUCATION: PATIENT EDUCATED ON FOLLOWING PROSTHETIC CARE: Education details:  Skin check, Residual limb care, Prosthetic cleaning, Correct ply sock adjustment, Propper donning, and Proper wear schedule/adjustment Prosthetic wear tolerance: 2 hours 2x/day, 7 days/week Person educated: Patient and Spouse Education method: Explanation, Demonstration, Tactile cues, and Verbal cues Education comprehension: verbalized understanding, verbal cues required, tactile cues required, and needs further education  HOME EXERCISE PROGRAM: Access Code: [email or web address] URL: [email or web address] Date: 08/13/2023 Prepared by: Lorie Rook  Exercises - Seated Hamstring Stretch with Strap  - 1-2 x daily - 7 x weekly - 1 sets - 2-3 reps - 30 seconds hold - Standing Gastroc Stretch on Step  - 1-2 x daily - 7 x weekly - 1 sets - 2-3 reps - 30 seconds hold - Upright Stance at Door Frame Single Arm  - 1-3 x daily - 7 x weekly - 1 sets - 2 reps - 2 deep breathes hold - Upright Stance at Door Frame with Both Arms  - 1-3 x daily - 7 x weekly - 1 sets - 2 reps - 2 deep breathes hold - Seated Hip Flexion Toward Target  - 1 x daily - 5 x weekly - 1 sets - 10 reps - 5 seconds hold - Seated Eccentric Abdominal Lean Back  - 1 x daily - 5 x weekly - 1 sets - 10 reps - 5 seconds hold - Seated Sidebending  - 1 x daily - 5 x weekly - 1 sets - 10 reps - 5 seconds hold - Seated Trunk Rotation with Crossed Arms  - 1 x daily - 5 x weekly - 1 sets - 10 reps - 5  seconds hold - Standing Hip Extension  - 1 x daily - 5 x weekly - 1 sets - 10 reps - 5 seconds hold - Standing Hip Abduction  - 1 x daily - 5 x weekly - 1 sets - 10 reps - 5 seconds hold   Printed and issued 06/25/23 Do each exercise 1-2  times per day Do each exercise 5-10 repetitions Hold each exercise for 2 seconds to feel your location  AT SINK FIND YOUR MIDLINE POSITION AND PLACE FEET EQUAL DISTANCE FROM THE MIDLINE.  Try to find this position when standing still for activities.   USE TAPE ON FLOOR TO MARK THE MIDLINE POSITION which is even with middle of sink.  You also should try to feel with your limb pressure in socket.  You are trying to feel with limb what you used to feel with the bottom of your foot.  Side to Side Shift: Moving your hips only (not shoulders): move weight  onto your left leg, HOLD/FEEL pressure in socket.  Move back to equal weight on each leg, HOLD/FEEL pressure in socket. Move weight onto your right leg, HOLD/FEEL pressure in socket. Move back to equal weight on each leg, HOLD/FEEL pressure in socket. Repeat.  Start with both hands on sink, progress to hand on prosthetic side only, then no hands.  Front to Back Shift: Moving your hips only (not shoulders): move your weight forward onto your toes, HOLD/FEEL pressure in socket. Move your weight back to equal Flat Foot on both legs, HOLD/FEEL  pressure in socket. Move your weight back onto your heels, HOLD/FEEL  pressure in socket. Move your weight back to equal on both legs, HOLD/FEEL  pressure in socket. Repeat.  Start with both hands on sink, progress to hand on prosthetic side only, then no hands.  Moving Cones / Cups: With equal weight on each leg: Hold on with one hand the first time, then progress to no hand supports. Move cups from one side of sink to the other. Place cups ~2" out of your reach, progress to 10" beyond reach.  Place one hand in middle of sink and reach with other hand. Do both arms.  Then hover one  hand and move cups with other hand.  Overhead/Upward Reaching: alternated reaching up to top cabinets or ceiling if no cabinets present. Keep equal weight on each leg. Start with one hand support on counter while other hand reaches and progress to no hand support with reaching.  ace one hand in middle of sink and reach with other hand. Do both arms.  Then hover one hand and move cups with other hand.  5.   Looking Over Shoulders: With equal weight on each leg: alternate turning to look over your shoulders with one hand support on counter as needed.  Start with head motions only to look in front of shoulder, then even with shoulder and progress to looking behind you. To look to side, move head /eyes, then shoulder on side looking pulls back, shift more weight to side looking and pull hip back. Place one hand in middle of sink and let go with other hand so your shoulder can pull back. Switch hands to look other way.   Then hover one hand and look over shoulder. If looking right, use left hand at sink. If looking left, use right hand at sink. 6.  Stepping with leg that is not amputated:  Move items under cabinet out of your way. Shift your hips/pelvis so weight on prosthesis. Tighten muscles in hip on prosthetic side.  SLOWLY step other leg so front of foot is in cabinet. Then step back to floor.   ASSESSMENT:  CLINICAL IMPRESSION: Patient appears to understand updated HEP.   His mobility is improving but limited by his chronic pain issues.  Patient continues to benefit from skilled physical therapy.    OBJECTIVE IMPAIRMENTS: Abnormal gait, cardiopulmonary status limiting activity, decreased activity tolerance, decreased balance, decreased endurance, decreased knowledge of condition, decreased knowledge of use of DME, decreased mobility, difficulty walking, decreased ROM, decreased strength, increased edema, prosthetic dependency , and pain.   ACTIVITY LIMITATIONS: carrying, lifting, bending, standing,  stairs, transfers, and locomotion level  PARTICIPATION LIMITATIONS: meal prep, cleaning, driving, community activity, and yard work  PERSONAL FACTORS: Fitness, Time since onset of injury/illness/exacerbation, and 3+ comorbidities: see PMH  are also affecting patient's functional outcome.   REHAB POTENTIAL: Good  CLINICAL DECISION MAKING: Evolving/moderate complexity  EVALUATION COMPLEXITY: Moderate  GOALS: Goals reviewed with patient? Yes  SHORT TERM GOALS: Target date: 07/02/2023  Patient donnes prosthesis modified independent & verbalizes proper cleaning. Baseline: SEE OBJECTIVE DATA Goal status:   MET 06/30/2023 2.  Patient tolerates prosthesis >6 hrs total /day without skin issues or limb pain >5/10 after standing. Baseline: SEE OBJECTIVE DATA Goal status: Partially MET 06/30/2023  3.  Patient able to reach 5" and looks to sides without UE support with supervision. Baseline: SEE OBJECTIVE DATA Goal status: MET 07/07/2023  4. Patient ambulates 66' with RW & prosthesis with supervision. Baseline: SEE OBJECTIVE DATA Goal status: MET 07/07/2023  5. Patient negotiates ramps & curbs with RW & prosthesis with minA. Baseline: SEE OBJECTIVE DATA Goal status: MET 06/23/2023  LONG TERM GOALS: Target date: 08/28/2023  Patient demonstrates & verbalized understanding of prosthetic care to enable safe utilization of prosthesis. Baseline: SEE OBJECTIVE DATA Goal status:   Ongoing   08/13/2023  Patient tolerates prosthesis wear >90% of awake hours without skin or limb pain issues. Baseline: SEE OBJECTIVE DATA Goal status: Ongoing   08/13/2023  Berg Balance >36/56 to indicate lower fall risk Baseline: SEE OBJECTIVE DATA Goal status: Ongoing   08/13/2023  Patient ambulates >300' with prosthesis & LRAD independently Baseline: SEE OBJECTIVE DATA Goal status: Ongoing   08/13/2023  Patient negotiates ramps, curbs & stairs with single rail with prosthesis & LRAD independently. Baseline: SEE  OBJECTIVE DATA Goal status: Ongoing  08/13/2023   PLAN:  PT FREQUENCY: 2x/week  PT DURATION: 12 weeks  PLANNED INTERVENTIONS: 97164- PT Re-evaluation, 97110-Therapeutic exercises, 97530- Therapeutic activity, 97112- Neuromuscular re-education, [phone]- Self Care, 60454- Gait training, [phone]- Prosthetic training, Patient/Family education, Balance training, Stair training, Scar mobilization, Vestibular training, DME instructions, and Physical Performance Testing  PLAN FOR NEXT SESSION:  Do [employer] reauthorization,  check if he saw Dr. Lajoyce Corners & if he has appointment with prosthetist, Continue ambulation with [facility], continue to progress ambulation tolerance as patient is fit, standing balance activities.    Date of referral: 04/17/2023  PT eval delayed by COPD exacerbation Referring provider: Aldean Baker, MD Referring diagnosis? Z89.511 (ICD-10-CM) - S/P BKA (below knee amputation), right Treatment diagnosis? (if different than referring diagnosis)   Other abnormalities of gait & mobility R26.89 Unsteadiness on feet R26.81 Pain in left leg M79.605 Pain in right leg M79.604 Muscle Weakness (generalized) M62.81 History of falling Z91.81 Impaired functional mobility, balance, gait and endurance  What was this (referring dx) caused by? Surgery (Type: right BKA)  Nature of Condition: Initial Onset (within last 3 months)  prosthesis delivery 04/01/2023   Laterality: Rt  Current Functional Measure Score: Other Berg Balance 19/56  Objective measurements identify impairments when they are compared to normal values, the uninvolved extremity, and prior level of function.  [x]  Yes  []  No  Objective assessment of functional ability: Severe functional limitations   Briefly describe symptoms: dependent in care & use of prosthesis, impaired balance and gait, pain in both legs  How did symptoms start: amputation from wounds  Average pain intensity:  Last 24 hours: 8/10  Past week: 8/10  How often  does the pt experience symptoms? Constantly  How much have the symptoms interfered with usual daily activities? Extremely  How has condition changed since care began at this facility? NA - initial visit  In general, how is the patients overall health? Good   BACK PAIN (STarT Back Screening Tool) No   Vladimir Faster, PT, DPT 08/13/2023, 12:37 PM

## 2023-08-14 ENCOUNTER — Encounter: Payer: Self-pay | Admitting: Orthopedic Surgery

## 2023-08-14 ENCOUNTER — Ambulatory Visit: Admitting: Orthopedic Surgery

## 2023-08-14 DIAGNOSIS — I87332 Chronic venous hypertension (idiopathic) with ulcer and inflammation of left lower extremity: Secondary | ICD-10-CM | POA: Diagnosis not present

## 2023-08-14 DIAGNOSIS — Z89511 Acquired absence of right leg below knee: Secondary | ICD-10-CM

## 2023-08-14 NOTE — Progress Notes (Signed)
 Office Visit Note   Patient: [name]           Date of Birth: 1950-07-08           MRN: [id number] Visit Date: 08/14/2023              Requested by: Barnetta Liberty, MD [address] [place],  [place] [address] PCP: Barnetta Liberty, MD  Chief Complaint  Patient presents with   Left Leg - Wound Check      HPI: Patient is a 73 year old gentleman with a right below-knee amputation and left venous stasis ulceration.  Patient states he is on Lasix 20 mg twice a day.  Currently wearing the Vive sock on the left prosthesis on the right.  Assessment & Plan: Visit Diagnoses:  1. S/P BKA (below knee amputation), right (HCC)   2. Chronic venous hypertension (idiopathic) with ulcer and inflammation of left lower extremity (HCC)     Plan: Patient will follow-up with [facility] for prosthetic socket modification.  Will continue with the Vive compression sock and will call patient to follow-up for consideration of the venous stasis study.  Follow-Up Instructions: Return in about 1 week (around 08/21/2023).   Ortho Exam  Patient is alert, oriented, no adenopathy, well-dressed, normal affect, normal respiratory effort. More patient has a stable right residual limb without ulceration.  Examination of the left lower extremity he has several venous stasis ulcers the largest on the anterior lateral knees are flat with healthy granulation tissue no cellulitis no drainage he does have pitting edema.  The ulcer over the dorsum of the foot has healed.  Imaging: No results found. No images are attached to the encounter.  Labs: Lab Results  Component Value Date   HGBA1C 6.3 (H) 11/13/2022   HGBA1C 6.5 (H) 12/27/2021   HGBA1C 6.6 (H) 06/11/2021   ESRSEDRATE 60 (H) 09/04/2015   REPTSTATUS 10/22/2022 FINAL 10/17/2022   GRAMSTAIN NO WBC SEEN NO ORGANISMS SEEN  10/09/2022   CULT  10/17/2022    NO GROWTH 5 DAYS Performed at [facility], [address], [place], [place] [address]     [facility] : [facility] STAPHYLOCOCCUS AUREUS 10/09/2022   LABORGA STAPHYLOCOCCUS EPIDERMIDIS 10/09/2022     Lab Results  Component Value Date   ALBUMIN 3.4 (L) 04/15/2023   ALBUMIN 3.9 02/25/2023   ALBUMIN 3.5 01/31/2023   PREALBUMIN 25 09/04/2022    Lab Results  Component Value Date   MG 2.4 12/23/2022   MG 2.0 06/11/2021   MG 2.3 12/10/2020   No results found for: "VD25OH"  Lab Results  Component Value Date   PREALBUMIN 25 09/04/2022      Latest Ref Rng & Units 04/15/2023   11:15 AM 02/25/2023   10:05 AM 01/31/2023    8:50 AM  CBC EXTENDED  WBC 4.0 - 10.5 K/uL 7.1  9.8    9.6  10.4   RBC 4.22 - 5.81 MIL/uL 4.35  4.84    4.87  4.40   Hemoglobin 13.0 - 17.0 g/dL 9.6  41.3    24.4  01.0   HCT 39.0 - 52.0 % 34.2  38.5    38.6  35.6   Platelets 150 - 400 K/uL 175  206    207  208   NEUT# 1.7 - 7.7 K/uL 5.4  7.9    Lymph# 0.7 - 4.0 K/uL 0.8  0.8       There is no height or weight on file to calculate BMI.  Orders:  No orders of the defined types were placed in this encounter.  No orders of the defined types were placed in this encounter.    Procedures: No procedures performed  Clinical Data: No additional findings.  ROS:  All other systems negative, except as noted in the HPI. Review of Systems  Objective: Vital Signs: There were no vitals taken for this visit.  Specialty Comments:  No specialty comments available.  PMFS History: Patient Active Problem List   Diagnosis Date Noted   AKI (acute kidney injury) (HCC) 12/22/2022   Fall 11/14/2022   Pain of right lower extremity due to injury 11/14/2022   Diabetic ulcer of lower leg (HCC) 11/13/2022   Osteomyelitis (HCC) 11/13/2022   Dyslipidemia 10/18/2022   Cellulitis of right lower extremity 10/17/2022   Dehiscence of amputation stump of right lower extremity (HCC) 10/09/2022   Below-knee amputation of right lower extremity (HCC) 10/09/2022   Gangrene of right foot (HCC) 09/04/2022   History of below-knee  amputation of right lower extremity (HCC) 09/04/2022   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) 08/23/2022   CAP (community acquired pneumonia) 12/28/2021   Constipation 12/28/2021   Pain in both lower extremities 07/03/2021   Peripheral neuropathy 07/03/2021   Acute metabolic encephalopathy 06/09/2021   Marijuana dependence (HCC) 06/09/2021   DNR (do not resuscitate) 06/09/2021   Chest pain 06/08/2021   Renal lesion 02/14/2021   Status post cryoablation 02/14/2021   Allergic rhinitis 12/08/2020   Preop pulmonary/respiratory exam 03/27/2020   Acute on chronic diastolic (congestive) heart failure (HCC) 01/22/2020   Abdominal pain 01/22/2020   Hypothyroidism 12/13/2019   DM2 (diabetes mellitus, type 2) (HCC) 12/13/2019   Chronic kidney disease, stage 3b (HCC) 12/13/2019   Acute bacterial bronchitis 12/13/2019   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   Typical atrial flutter (HCC)    Chronic respiratory failure with hypoxia (HCC) 05/23/2016   CAD (coronary artery disease) 05/23/2016   Persistent atrial fibrillation (HCC)    Atypical atrial flutter (HCC)    Visit for monitoring Tikosyn therapy 04/16/2016   Chronic diastolic CHF (congestive heart failure) (HCC) 02/03/2016   Cough 12/06/2015   Acute on chronic respiratory failure with hypoxia (HCC) 09/08/2015   Hallucinations 09/04/2015   Lymphadenopathy 09/04/2015   Ascending aortic aneurysm (HCC) 09/04/2015   Hypoxia 09/02/2015   S/P aortic valve replacement with bioprosthetic valve 09/02/2015   Pleural effusion 09/02/2015   S/P AVR 08/22/2015   GERD without esophagitis 09/20/2014   Intrinsic asthma 10/13/2013   Dyspnea 09/15/2013   Pulmonary hypertension (HCC) 09/15/2013   Shortness of breath 05/05/2013   Pacemaker-St.Jude 04/01/2012   Bradycardia 03/31/2012   Second degree AV block 03/31/2012   AV block, 1st degree 02/01/2012   PAF (paroxysmal atrial fibrillation) (HCC) 11/13/2011   Fatigue 11/13/2011   Essential  hypertension 11/13/2011   Aortic stenosis 11/13/2011   Sleep apnea 11/13/2011   Past Medical History:  Diagnosis Date   Aortic stenosis, mild    Arthritis of hand    "just a little bit in both hands" (03/31/2012)   Asthma    "little bit" (03/31/2012)   Atrial fibrillation (HCC)    dx '04; DCCV '04, placed on flecainide, failed DCCV 04/2010, flecainide stopped; s/p successful A.fib ablation 01/31/12   CHF (congestive heart failure) (HCC)    Controlled type 2 diabetes mellitus without complication, without long-term current use of insulin (HCC) 12/13/2019   COPD (chronic obstructive pulmonary disease) (HCC)    DJD (degenerative joint disease)  Fatty liver    Fibromyalgia    GERD (gastroesophageal reflux disease)    Hyperlipidemia    Hypertension    Hypothyroidism    S/P radiation   Left atrial enlargement    LA size 45mm by echo 11/21/11   Mitral regurgitation    trivial   Obstructive sleep apnea    mild by sleep study 2013; pt stated he does not have a machine because "it wasn't bad enough for him to have one"   Pacemaker 03/31/2012   Pneumonia    last time 2023   Restless leg syndrome    Second degree AV block    Splenomegaly     Family History  Problem Relation Age of Onset   Heart failure Mother    Stroke Father    Neuropathy Neg Hx     Past Surgical History:  Procedure Laterality Date   AMPUTATION Right 08/23/2022   Procedure: RIGHT FOOT FIFTH RAY AMPUTATION;  Surgeon: Timothy Ford, MD;  Location: [facility] OR;  Service: Orthopedics;  Laterality: Right;   AMPUTATION Right 09/04/2022   Procedure: RIGHT BELOW KNEE AMPUTATION;  Surgeon: Timothy Ford, MD;  Location: [facility] OR;  Service: Orthopedics;  Laterality: Right;   AORTIC VALVE REPLACEMENT N/A 08/22/2015   Procedure: AORTIC VALVE REPLACEMENT (AVR);  Surgeon: Heriberto London, MD;  Location: [facility] OR;  Service: Open Heart Surgery;  Laterality: N/A;   ATRIAL FIBRILLATION ABLATION  01/30/2012   PVI by Dr. Nunzio Belch   ATRIAL  FIBRILLATION ABLATION N/A 01/31/2012   Procedure: ATRIAL FIBRILLATION ABLATION;  Surgeon: Jolly Needle, MD;  Location: [facility] CATH LAB;  Service: Cardiovascular;  Laterality: N/A;   ATRIAL FIBRILLATION ABLATION N/A 04/19/2020   Procedure: ATRIAL FIBRILLATION ABLATION;  Surgeon: Jolly Needle, MD;  Location: [facility] INVASIVE CV LAB;  Service: Cardiovascular;  Laterality: N/A;   BACK SURGERY     X 3   BIOPSY  01/01/2022   Procedure: BIOPSY;  Surgeon: Felecia Hopper, MD;  Location: [facility] ENDOSCOPY;  Service: Gastroenterology;;   CARDIAC CATHETERIZATION N/A 08/09/2015   Procedure: Right/Left Heart Cath and Coronary Angiography;  Surgeon: Peter M Swaziland, MD;  Location: [facility] INVASIVE CV LAB;  Service: Cardiovascular;  Laterality: N/A;   CARDIAC CATHETERIZATION N/A 02/05/2016   Procedure: Right/Left Heart Cath and Coronary Angiography;  Surgeon: Lucendia Rusk, MD;  Location: [facility] INVASIVE CV LAB;  Service: Cardiovascular;  Laterality: N/A;   CARDIAC CATHETERIZATION N/A 04/17/2016   Procedure: Right Heart Cath;  Surgeon: Mardell Shade, MD;  Location: [facility] INVASIVE CV LAB;  Service: Cardiovascular;  Laterality: N/A;   CARDIOVASCULAR STRESS TEST  03/12/2002   EF 48%, NO EVIDENCE OF ISCHEMIA   CARDIOVERSION  01/2012; 03/31/2012   CARDIOVERSION N/A 02/25/2012   Procedure: CARDIOVERSION;  Surgeon: Jolly Needle, MD;  Location: [facility] CATH LAB;  Service: Cardiovascular;  Laterality: N/A;   CARDIOVERSION N/A 03/31/2012   Procedure: CARDIOVERSION;  Surgeon: Jolly Needle, MD;  Location: [facility] CATH LAB;  Service: Cardiovascular;  Laterality: N/A;   CARDIOVERSION N/A 11/23/2015   Procedure: CARDIOVERSION;  Surgeon: Darlis Eisenmenger, MD;  Location: [facility] ENDOSCOPY;  Service: Cardiovascular;  Laterality: N/A;   CARDIOVERSION N/A 04/08/2016   Procedure: CARDIOVERSION;  Surgeon: Mardell Shade, MD;  Location: [facility] ENDOSCOPY;  Service: Cardiovascular;  Laterality: N/A;   CARDIOVERSION N/A 09/14/2018   Procedure: CARDIOVERSION;   Surgeon: Loyde Rule, MD;  Location: [facility] ENDOSCOPY;  Service: Cardiovascular;  Laterality: N/A;   CARDIOVERSION N/A 10/19/2019   Procedure: CARDIOVERSION;  Surgeon: Jules Oar  R, MD;  Location: [facility] ENDOSCOPY;  Service: Cardiovascular;  Laterality: N/A;   CARDIOVERSION N/A 02/25/2020   Procedure: CARDIOVERSION;  Surgeon: Elmyra Haggard, MD;  Location: [facility] ENDOSCOPY;  Service: Cardiovascular;  Laterality: N/A;   CARDIOVERSION N/A 05/25/2020   Procedure: CARDIOVERSION;  Surgeon: Lenise Quince, MD;  Location: [facility] ENDOSCOPY;  Service: Cardiovascular;  Laterality: N/A;   CARDIOVERSION N/A 07/10/2020   Procedure: CARDIOVERSION;  Surgeon: Hugh Madura, MD;  Location: [facility] ENDOSCOPY;  Service: Cardiovascular;  Laterality: N/A;   CLIPPING OF ATRIAL APPENDAGE N/A 08/22/2015   Procedure: CLIPPING OF ATRIAL APPENDAGE;  Surgeon: Heriberto London, MD;  Location: [facility] OR;  Service: Open Heart Surgery;  Laterality: N/A;   COLONOSCOPY WITH PROPOFOL N/A 01/01/2022   Procedure: COLONOSCOPY WITH PROPOFOL;  Surgeon: Felecia Hopper, MD;  Location: [facility] ENDOSCOPY;  Service: Gastroenterology;  Laterality: N/A;   DOPPLER ECHOCARDIOGRAPHY  03/11/2002   EF 70-75%   FINGER SURGERY Left    Middle finger   FINGER TENDON REPAIR  1980's   "right little finger" (03/31/2012)   FLEXIBLE SIGMOIDOSCOPY N/A 12/30/2021   Procedure: FLEXIBLE SIGMOIDOSCOPY;  Surgeon: Baldo Bonds, MD;  Location: [facility] ENDOSCOPY;  Service: Gastroenterology;  Laterality: N/A;   INSERT / REPLACE / REMOVE PACEMAKER     St Jude   IR RADIOLOGIST EVAL & MGMT  05/06/2019   IR RADIOLOGIST EVAL & MGMT  10/06/2019   IR RADIOLOGIST EVAL & MGMT  10/31/2020   IR RADIOLOGIST EVAL & MGMT  01/18/2021   IR RADIOLOGIST EVAL & MGMT  03/08/2021   IR RADIOLOGIST EVAL & MGMT  06/13/2021   KIDNEY SURGERY Left    LEFT AND RIGHT HEART CATHETERIZATION WITH CORONARY ANGIOGRAM N/A 10/27/2012   Procedure: LEFT AND RIGHT HEART CATHETERIZATION WITH CORONARY ANGIOGRAM;   Surgeon: Jessica Morn, MD;  Location: [facility] CATH LAB;  Service: Cardiovascular;  Laterality: N/A;   LUMBAR DISC SURGERY  1980's X2;  2000's   MAZE N/A 08/22/2015   Procedure: MAZE;  Surgeon: Heriberto London, MD;  Location: [facility] OR;  Service: Open Heart Surgery;  Laterality: N/A;   PACEMAKER INSERTION  03/31/2012   STJ Accent DR pacemaker implanted by Dr Nunzio Belch   PERMANENT PACEMAKER INSERTION N/A 03/31/2012   Procedure: PERMANENT PACEMAKER INSERTION;  Surgeon: Jolly Needle, MD;  Location: [facility] CATH LAB;  Service: Cardiovascular;  Laterality: N/A;   PPM GENERATOR CHANGEOUT N/A 08/08/2020   Procedure: PPM GENERATOR CHANGEOUT;  Surgeon: Jolly Needle, MD;  Location: [facility] INVASIVE CV LAB;  Service: Cardiovascular;  Laterality: N/A;   RADIOLOGY WITH ANESTHESIA Left 02/14/2021   Procedure: RADIOLOGY WITH ANESTHESIA LEFT RENAL CRYOABLATION;  Surgeon: Erica Hau, MD;  Location: [facility] ORS;  Service: Radiology;  Laterality: Left;   STUMP REVISION Right 10/09/2022   Procedure: REVISION RIGHT BELOW KNEE AMPUTATION;  Surgeon: Timothy Ford, MD;  Location: [facility] OR;  Service: Orthopedics;  Laterality: Right;   STUMP REVISION Right 11/16/2022   Procedure: REVISION RIGHT BELOW KNEE AMPUTATION;  Surgeon: Timothy Ford, MD;  Location: [facility] OR;  Service: Orthopedics;  Laterality: Right;   TEE WITHOUT CARDIOVERSION  01/30/2012   Procedure: TRANSESOPHAGEAL ECHOCARDIOGRAM (TEE);  Surgeon: Darlis Eisenmenger, MD;  Location: [facility] ENDOSCOPY;  Service: Cardiovascular;  Laterality: N/A;  ablation next day   TEE WITHOUT CARDIOVERSION N/A 08/22/2015   Procedure: TRANSESOPHAGEAL ECHOCARDIOGRAM (TEE);  Surgeon: Heriberto London, MD;  Location: [facility] OR;  Service: Open Heart Surgery;  Laterality: N/A;   TEE WITHOUT CARDIOVERSION N/A 10/19/2019   Procedure: TRANSESOPHAGEAL  ECHOCARDIOGRAM (TEE);  Surgeon: Mardell Shade, MD;  Location: [facility] ENDOSCOPY;  Service: Cardiovascular;  Laterality: N/A;   US  ECHOCARDIOGRAPHY  08/07/2009   EF 55-60%    Social History   Occupational History   Occupation: retired  Tobacco Use   Smoking status: Former    Current packs/day: 0.00    Average packs/day: 1 pack/day for 30.2 years (30.2 ttl pk-yrs)    Types: Cigarettes    Start date: 1963    Quit date: 07/18/1991    Years since quitting: 32.0    Passive exposure: Never   Smokeless tobacco: Never  Vaping Use   Vaping status: Never Used  Substance and Sexual Activity   Alcohol use: Not Currently    Comment: occasional   Drug use: Yes    Types: Marijuana    Comment: Marijuana- for nausea   Sexual activity: Not Currently

## 2023-08-18 ENCOUNTER — Ambulatory Visit: Admitting: Orthopedic Surgery

## 2023-08-18 ENCOUNTER — Encounter: Admitting: Physical Therapy

## 2023-08-18 ENCOUNTER — Other Ambulatory Visit (HOSPITAL_COMMUNITY): Payer: Self-pay | Admitting: Neurosurgery

## 2023-08-18 DIAGNOSIS — M4802 Spinal stenosis, cervical region: Secondary | ICD-10-CM

## 2023-08-20 ENCOUNTER — Telehealth: Payer: Self-pay

## 2023-08-20 ENCOUNTER — Encounter: Payer: Self-pay | Admitting: Physical Therapy

## 2023-08-20 ENCOUNTER — Encounter: Payer: Self-pay | Admitting: Emergency Medicine

## 2023-08-20 ENCOUNTER — Ambulatory Visit: Payer: Medicare Other | Admitting: Pulmonary Disease

## 2023-08-20 ENCOUNTER — Other Ambulatory Visit (HOSPITAL_COMMUNITY): Payer: Self-pay

## 2023-08-20 ENCOUNTER — Ambulatory Visit: Payer: Medicare Other | Admitting: Emergency Medicine

## 2023-08-20 ENCOUNTER — Ambulatory Visit: Admitting: Physical Therapy

## 2023-08-20 VITALS — BP 114/56 | HR 60 | Ht 71.0 in | Wt 202.6 lb

## 2023-08-20 DIAGNOSIS — R2681 Unsteadiness on feet: Secondary | ICD-10-CM

## 2023-08-20 DIAGNOSIS — M79604 Pain in right leg: Secondary | ICD-10-CM | POA: Diagnosis not present

## 2023-08-20 DIAGNOSIS — J449 Chronic obstructive pulmonary disease, unspecified: Secondary | ICD-10-CM

## 2023-08-20 DIAGNOSIS — M79605 Pain in left leg: Secondary | ICD-10-CM | POA: Diagnosis not present

## 2023-08-20 DIAGNOSIS — M6281 Muscle weakness (generalized): Secondary | ICD-10-CM

## 2023-08-20 DIAGNOSIS — R2689 Other abnormalities of gait and mobility: Secondary | ICD-10-CM | POA: Diagnosis not present

## 2023-08-20 DIAGNOSIS — Z7409 Other reduced mobility: Secondary | ICD-10-CM

## 2023-08-20 MED ORDER — ROFLUMILAST 250 MCG PO TABS: 250.0000 ug | ORAL_TABLET | Freq: Every day | ORAL | 0 refills | Status: DC

## 2023-08-20 MED ORDER — PREDNISONE 20 MG PO TABS: 20.0000 mg | ORAL_TABLET | Freq: Every day | ORAL | 0 refills | Status: DC

## 2023-08-20 MED ORDER — DOXYCYCLINE HYCLATE 100 MG PO TABS: 100.0000 mg | ORAL_TABLET | Freq: Two times a day (BID) | ORAL | 0 refills | Status: DC

## 2023-08-20 NOTE — Assessment & Plan Note (Signed)
 Severe COPD with chronic bronchitic symptoms, recurrent bronchitis.  He is on 2.5 mg prednisone .  Frequent flares.  We will treat for an acute flare and bronchitis now.  I will try adding Daliresp .  If he tolerates 250 mcg then we can increase to 500 mcg in 1 month.  Also discussed with him possibly starting Ohtuvayre , could consider rotating antibiotics at the beginning of every month given the chronic bronchitic phenotype.  Take prednisone  20 mg once daily for 5 days, then go back to your usual 2.5 mg daily Take doxycycline  1 mg twice a day for 7 days until completely gone Please continue your Breztri  2 puffs twice a day.  Rinse gargle after use. Keep albuterol  available to use either 2 puffs or 1 nebulizer treatment when needed for shortness of breath, chest tightness, wheezing. Please start Daliresp  250 mcg once daily.  If you are doing well on this medication we will increase it to 500 mcg at your next office visit.  Please note that it can sometimes cause stomach bloating and diarrhea.  This usually improves after you get used to the medication. We talked today about possibly starting a new medication at some point called Ohtuvayre  which is a nebulizer that can help with severe COPD. Please use your oxygen  with exertion Follow-up in our office in 1 month to discuss increasing the dose of your Daliresp .

## 2023-08-20 NOTE — Therapy (Signed)
 OUTPATIENT PHYSICAL THERAPY PROSTHETIC TREATMENT & DISCHARGE SUMMARY   Patient Name: [name] MRN: [id number] DOB:[birth year], 73 y.o., male Today's Date: 08/20/2023  PHYSICAL THERAPY DISCHARGE SUMMARY  Visits from Start of Care: 16  Current functional level related to goals / functional outcomes: See below   Remaining deficits: See below   Education / Equipment: Patient was educated in prosthetic care and HEP which he appears to understand.    Patient agrees to discharge. Patient goals were partially met. Patient is being discharged due to meeting the stated rehab goals.   END OF SESSION:  PT End of Session - 08/20/23 1305     Visit Number 16    Number of Visits 25    Date for PT Re-Evaluation 08/28/23    Authorization Type [employer] Medicare    Authorization Time Period [employer] MEDICARE $20 COPAY 16 PT VISITS APPROVED 2/4-5/27/25    Authorization - Visit Number 16    Authorization - Number of Visits 16    Progress Note Due on Visit 20    PT Start Time 1301    PT Stop Time 1340    PT Time Calculation (min) 39 min    Equipment Utilized During Treatment Gait belt    Activity Tolerance Patient tolerated treatment well;Patient limited by pain;Patient limited by fatigue    Behavior During Therapy [facility] for tasks assessed/performed              Past Medical History:  Diagnosis Date   Aortic stenosis, mild    Arthritis of hand    "just a little bit in both hands" (03/31/2012)   Asthma    "little bit" (03/31/2012)   Atrial fibrillation (HCC)    dx '04; DCCV '04, placed on flecainide, failed DCCV 04/2010, flecainide stopped; s/p successful A.fib ablation 01/31/12   CHF (congestive heart failure) (HCC)    Controlled type 2 diabetes mellitus without complication, without long-term current use of insulin  (HCC) 12/13/2019   COPD (chronic obstructive pulmonary disease) (HCC)    DJD (degenerative joint disease)    Fatty liver    Fibromyalgia    GERD (gastroesophageal reflux  disease)    Hyperlipidemia    Hypertension    Hypothyroidism    S/P radiation   Left atrial enlargement    LA size 45mm by echo 11/21/11   Mitral regurgitation    trivial   Obstructive sleep apnea    mild by sleep study 2013; pt stated he does not have a machine because "it wasn't bad enough for him to have one"   Pacemaker 03/31/2012   Pneumonia    last time 2023   Restless leg syndrome    Second degree AV block    Splenomegaly    Past Surgical History:  Procedure Laterality Date   AMPUTATION Right 08/23/2022   Procedure: RIGHT FOOT FIFTH RAY AMPUTATION;  Surgeon: Timothy Ford, MD;  Location: [facility] OR;  Service: Orthopedics;  Laterality: Right;   AMPUTATION Right 09/04/2022   Procedure: RIGHT BELOW KNEE AMPUTATION;  Surgeon: Timothy Ford, MD;  Location: [facility] OR;  Service: Orthopedics;  Laterality: Right;   AORTIC VALVE REPLACEMENT N/A 08/22/2015   Procedure: AORTIC VALVE REPLACEMENT (AVR);  Surgeon: Heriberto London, MD;  Location: [facility] OR;  Service: Open Heart Surgery;  Laterality: N/A;   ATRIAL FIBRILLATION ABLATION  01/30/2012   PVI by Dr. Nunzio Belch   ATRIAL FIBRILLATION ABLATION N/A 01/31/2012   Procedure: ATRIAL FIBRILLATION ABLATION;  Surgeon: Jolly Needle, MD;  Location: [facility]  CATH LAB;  Service: Cardiovascular;  Laterality: N/A;   ATRIAL FIBRILLATION ABLATION N/A 04/19/2020   Procedure: ATRIAL FIBRILLATION ABLATION;  Surgeon: Jolly Needle, MD;  Location: [facility] INVASIVE CV LAB;  Service: Cardiovascular;  Laterality: N/A;   BACK SURGERY     X 3   BIOPSY  01/01/2022   Procedure: BIOPSY;  Surgeon: Felecia Hopper, MD;  Location: [facility] ENDOSCOPY;  Service: Gastroenterology;;   CARDIAC CATHETERIZATION N/A 08/09/2015   Procedure: Right/Left Heart Cath and Coronary Angiography;  Surgeon: Peter M Swaziland, MD;  Location: [facility] INVASIVE CV LAB;  Service: Cardiovascular;  Laterality: N/A;   CARDIAC CATHETERIZATION N/A 02/05/2016   Procedure: Right/Left Heart Cath and Coronary Angiography;  Surgeon:  Lucendia Rusk, MD;  Location: [facility] INVASIVE CV LAB;  Service: Cardiovascular;  Laterality: N/A;   CARDIAC CATHETERIZATION N/A 04/17/2016   Procedure: Right Heart Cath;  Surgeon: Mardell Shade, MD;  Location: [facility] INVASIVE CV LAB;  Service: Cardiovascular;  Laterality: N/A;   CARDIOVASCULAR STRESS TEST  03/12/2002   EF 48%, NO EVIDENCE OF ISCHEMIA   CARDIOVERSION  01/2012; 03/31/2012   CARDIOVERSION N/A 02/25/2012   Procedure: CARDIOVERSION;  Surgeon: Jolly Needle, MD;  Location: [facility] CATH LAB;  Service: Cardiovascular;  Laterality: N/A;   CARDIOVERSION N/A 03/31/2012   Procedure: CARDIOVERSION;  Surgeon: Jolly Needle, MD;  Location: [facility] CATH LAB;  Service: Cardiovascular;  Laterality: N/A;   CARDIOVERSION N/A 11/23/2015   Procedure: CARDIOVERSION;  Surgeon: Darlis Eisenmenger, MD;  Location: [facility] ENDOSCOPY;  Service: Cardiovascular;  Laterality: N/A;   CARDIOVERSION N/A 04/08/2016   Procedure: CARDIOVERSION;  Surgeon: Mardell Shade, MD;  Location: [facility] ENDOSCOPY;  Service: Cardiovascular;  Laterality: N/A;   CARDIOVERSION N/A 09/14/2018   Procedure: CARDIOVERSION;  Surgeon: Loyde Rule, MD;  Location: [facility] ENDOSCOPY;  Service: Cardiovascular;  Laterality: N/A;   CARDIOVERSION N/A 10/19/2019   Procedure: CARDIOVERSION;  Surgeon: Mardell Shade, MD;  Location: [facility] ENDOSCOPY;  Service: Cardiovascular;  Laterality: N/A;   CARDIOVERSION N/A 02/25/2020   Procedure: CARDIOVERSION;  Surgeon: Elmyra Haggard, MD;  Location: [facility] ENDOSCOPY;  Service: Cardiovascular;  Laterality: N/A;   CARDIOVERSION N/A 05/25/2020   Procedure: CARDIOVERSION;  Surgeon: Lenise Quince, MD;  Location: [facility] ENDOSCOPY;  Service: Cardiovascular;  Laterality: N/A;   CARDIOVERSION N/A 07/10/2020   Procedure: CARDIOVERSION;  Surgeon: Hugh Madura, MD;  Location: [facility] ENDOSCOPY;  Service: Cardiovascular;  Laterality: N/A;   CLIPPING OF ATRIAL APPENDAGE N/A 08/22/2015   Procedure: CLIPPING OF ATRIAL APPENDAGE;  Surgeon:  Heriberto London, MD;  Location: [facility] OR;  Service: Open Heart Surgery;  Laterality: N/A;   COLONOSCOPY WITH PROPOFOL  N/A 01/01/2022   Procedure: COLONOSCOPY WITH PROPOFOL ;  Surgeon: Felecia Hopper, MD;  Location: [facility] ENDOSCOPY;  Service: Gastroenterology;  Laterality: N/A;   DOPPLER ECHOCARDIOGRAPHY  03/11/2002   EF 70-75%   FINGER SURGERY Left    Middle finger   FINGER TENDON REPAIR  1980's   "right little finger" (03/31/2012)   FLEXIBLE SIGMOIDOSCOPY N/A 12/30/2021   Procedure: FLEXIBLE SIGMOIDOSCOPY;  Surgeon: Baldo Bonds, MD;  Location: [facility] ENDOSCOPY;  Service: Gastroenterology;  Laterality: N/A;   INSERT / REPLACE / REMOVE PACEMAKER     St Jude   IR RADIOLOGIST EVAL & MGMT  05/06/2019   IR RADIOLOGIST EVAL & MGMT  10/06/2019   IR RADIOLOGIST EVAL & MGMT  10/31/2020   IR RADIOLOGIST EVAL & MGMT  01/18/2021   IR RADIOLOGIST EVAL & MGMT  03/08/2021   IR RADIOLOGIST EVAL & MGMT  06/13/2021   KIDNEY SURGERY Left    LEFT AND RIGHT HEART CATHETERIZATION WITH CORONARY ANGIOGRAM N/A 10/27/2012   Procedure: LEFT AND RIGHT HEART CATHETERIZATION WITH CORONARY ANGIOGRAM;  Surgeon: Jessica Morn, MD;  Location: [facility] CATH LAB;  Service: Cardiovascular;  Laterality: N/A;   LUMBAR DISC SURGERY  1980's X2;  2000's   MAZE N/A 08/22/2015   Procedure: MAZE;  Surgeon: Heriberto London, MD;  Location: [facility] OR;  Service: Open Heart Surgery;  Laterality: N/A;   PACEMAKER INSERTION  03/31/2012   STJ Accent DR pacemaker implanted by Dr Nunzio Belch   PERMANENT PACEMAKER INSERTION N/A 03/31/2012   Procedure: PERMANENT PACEMAKER INSERTION;  Surgeon: Jolly Needle, MD;  Location: [facility] CATH LAB;  Service: Cardiovascular;  Laterality: N/A;   PPM GENERATOR CHANGEOUT N/A 08/08/2020   Procedure: PPM GENERATOR CHANGEOUT;  Surgeon: Jolly Needle, MD;  Location: [facility] INVASIVE CV LAB;  Service: Cardiovascular;  Laterality: N/A;   RADIOLOGY WITH ANESTHESIA Left 02/14/2021   Procedure: RADIOLOGY WITH ANESTHESIA LEFT RENAL  CRYOABLATION;  Surgeon: Erica Hau, MD;  Location: [facility] ORS;  Service: Radiology;  Laterality: Left;   STUMP REVISION Right 10/09/2022   Procedure: REVISION RIGHT BELOW KNEE AMPUTATION;  Surgeon: Timothy Ford, MD;  Location: [facility] OR;  Service: Orthopedics;  Laterality: Right;   STUMP REVISION Right 11/16/2022   Procedure: REVISION RIGHT BELOW KNEE AMPUTATION;  Surgeon: Timothy Ford, MD;  Location: [facility] OR;  Service: Orthopedics;  Laterality: Right;   TEE WITHOUT CARDIOVERSION  01/30/2012   Procedure: TRANSESOPHAGEAL ECHOCARDIOGRAM (TEE);  Surgeon: Darlis Eisenmenger, MD;  Location: [facility] ENDOSCOPY;  Service: Cardiovascular;  Laterality: N/A;  ablation next day   TEE WITHOUT CARDIOVERSION N/A 08/22/2015   Procedure: TRANSESOPHAGEAL ECHOCARDIOGRAM (TEE);  Surgeon: Heriberto London, MD;  Location: [facility] OR;  Service: Open Heart Surgery;  Laterality: N/A;   TEE WITHOUT CARDIOVERSION N/A 10/19/2019   Procedure: TRANSESOPHAGEAL ECHOCARDIOGRAM (TEE);  Surgeon: Mardell Shade, MD;  Location: [facility] ENDOSCOPY;  Service: Cardiovascular;  Laterality: N/A;   US  ECHOCARDIOGRAPHY  08/07/2009   EF 55-60%   Patient Active Problem List   Diagnosis Date Noted   AKI (acute kidney injury) (HCC) 12/22/2022   Fall 11/14/2022   Pain of right lower extremity due to injury 11/14/2022   Diabetic ulcer of lower leg (HCC) 11/13/2022   Osteomyelitis (HCC) 11/13/2022   Dyslipidemia 10/18/2022   Cellulitis of right lower extremity 10/17/2022   Dehiscence of amputation stump of right lower extremity (HCC) 10/09/2022   Below-knee amputation of right lower extremity (HCC) 10/09/2022   Gangrene of right foot (HCC) 09/04/2022   History of below-knee amputation of right lower extremity (HCC) 09/04/2022   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) 08/23/2022   CAP (community acquired pneumonia) 12/28/2021   Constipation 12/28/2021   Pain in both lower extremities 07/03/2021   Peripheral neuropathy 07/03/2021   Acute metabolic  encephalopathy 06/09/2021   Marijuana dependence (HCC) 06/09/2021   DNR (do not resuscitate) 06/09/2021   Chest pain 06/08/2021   Renal lesion 02/14/2021   Status post cryoablation 02/14/2021   Allergic rhinitis 12/08/2020   Preop pulmonary/respiratory exam 03/27/2020   Acute on chronic diastolic (congestive) heart failure (HCC) 01/22/2020   Abdominal pain 01/22/2020   Hypothyroidism 12/13/2019   DM2 (diabetes mellitus, type 2) (HCC) 12/13/2019   Chronic kidney disease, stage 3b (HCC) 12/13/2019   Acute bacterial bronchitis 12/13/2019   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   Typical atrial flutter (HCC)    Chronic respiratory  failure with hypoxia (HCC) 05/23/2016   CAD (coronary artery disease) 05/23/2016   Persistent atrial fibrillation (HCC)    Atypical atrial flutter (HCC)    Visit for monitoring Tikosyn  therapy 04/16/2016   Chronic diastolic CHF (congestive heart failure) (HCC) 02/03/2016   Cough 12/06/2015   Acute on chronic respiratory failure with hypoxia (HCC) 09/08/2015   Hallucinations 09/04/2015   Lymphadenopathy 09/04/2015   Ascending aortic aneurysm (HCC) 09/04/2015   Hypoxia 09/02/2015   S/P aortic valve replacement with bioprosthetic valve 09/02/2015   Pleural effusion 09/02/2015   S/P AVR 08/22/2015   GERD without esophagitis 09/20/2014   Intrinsic asthma 10/13/2013   Dyspnea 09/15/2013   Pulmonary hypertension (HCC) 09/15/2013   Shortness of breath 05/05/2013   Pacemaker-St.Jude 04/01/2012   Bradycardia 03/31/2012   Second degree AV block 03/31/2012   AV block, 1st degree 02/01/2012   PAF (paroxysmal atrial fibrillation) (HCC) 11/13/2011   Fatigue 11/13/2011   Essential hypertension 11/13/2011   Aortic stenosis 11/13/2011   Sleep apnea 11/13/2011    PCP: Barnetta Liberty, MD  REFERRING PROVIDER: Gearldean Keepers, MD  ONSET DATE: 04/01/2023 prosthesis delivery  REFERRING DIAG: Z89.511 (ICD-10-CM) - S/P BKA (below knee amputation), right   THERAPY DIAG:   Other abnormalities of gait and mobility  Unsteadiness on feet  Pain in right leg  Pain in left leg  Muscle weakness (generalized)  Impaired functional mobility, balance, gait, and endurance  Rationale for Evaluation and Treatment: Rehabilitation  SUBJECTIVE:   SUBJECTIVE STATEMENT: Patient and wife feel that he has benefited significantly from PT.  They feel that his mobility especially with rollator walker has improved what he can do at home and in the community.  He saw pulmonologist who put him on antibiotic and changed a couple of meds.  Dr. Julio Ohm who put him in Vive wear compression socket for LLE and continue prosthesis with RLE.     PERTINENT HISTORY:  right TTA 09/04/22, DM2, HLD, asthma, COPD, A-Fib, CAD, pacemaker, cardioversion & catheterization, arthritis, Fibromyalgia, GERD, HTN,   PAIN:  Are you having pain? Yes: NPRS scale:  today  8/10 Right leg, 5/10 left leg  and in last week down lower both legs Pain location: both legs Pain description: neuropathy, sharp, sticking "big" needles in leg Aggravating factors: meds wearing off,  standing & walking Relieving factors: pain pill 2x/day  Residual limb prosthesis on limb 0/10 - 10/10,  stand or walk 5/10 - 10/10  PRECAUTIONS: Fall and ICD/Pacemaker  WEIGHT BEARING RESTRICTIONS: No  FALLS: Has patient fallen in last 6 months? Yes. Number of falls 1 no injuries  LIVING ENVIRONMENT: Lives with: lives with their spouse and 3 dogs (mini schnauzer 15# indoor & 2 german [place] outdoor)  Lives in: [place] Access: Ramped entrance Home layout: One level Stairs: Yes: External: 6 steps; can reach both ramp at primary entrance Has following equipment at home: Single point cane, Environmental consultant - 2 wheeled, Wheelchair (manual), shower chair, Tour manager, and Ramped entry  OCCUPATION: retired from [employer]  PLOF: Independent, Independent with household mobility without device, and Independent with community mobility without  device used oxygen  as needed  PATIENT GOALS:  use prosthesis to function in community & in house, do odd repairs, yard work  OBJECTIVE:  COGNITION: Overall cognitive status: Within functional limits for tasks assessed   SENSATION: WFL  CARDIOVASCULAR RESPONSE: Functional activity: balance and gait assessments Pre-activity vitals: no oxygen  seated in w/c HR: 67 SpO2: 93 Post-activity vitals: Berg with no oxygen   HR: 78 SpO2: 90-91%  94% on oxygen  at rest, HR 70 After gait with O2 SpO2 94%, HR 73  Modified Borg scale for dyspnea: 2: mild shortness of breath  POSTURE: rounded shoulders, forward head, flexed trunk , and weight shift left  LOWER EXTREMITY ROM:  ROM P:passive  A:active Right eval  Hip flexion   Hip extension standing -10*   Hip abduction   Hip adduction   Hip internal rotation   Hip external rotation   Knee flexion   Knee extension   Ankle dorsiflexion   Ankle plantarflexion   Ankle inversion   Ankle eversion    (Blank rows = not tested)  LOWER EXTREMITY MMT:  MMT Right eval Left eval  Hip flexion    Hip extension 3-4/5 3-4/5  Hip abduction 3-4/5 4/5  Hip adduction    Hip internal rotation    Hip external rotation    Knee flexion 3-4/5 4/5  Knee extension 3-4/5 4/5  Ankle dorsiflexion    Ankle plantarflexion    Ankle inversion    Ankle eversion    At Evaluation all strength testing is grossly seated and functionally standing / gait. (Blank rows = not tested)  TRANSFERS: Evaluation on 06/03/2023:    Sit to stand: SBA from 20" w/c using BUEs on armrests to arise stabilizes with back of legs against w/c Stand to sit: SBA uses BUEs on armrests of w/c to control descent  FUNCTIONAL TESTs:  08/20/2023:  Randye Buttner Balance 26/56  [facility] PT Assessment - 08/20/23 1301       Standardized Balance Assessment   Standardized Balance Assessment Berg Balance Test      Berg Balance Test   Sit to Stand Able to stand  independently using hands     Standing Unsupported Able to stand safely 2 minutes    Sitting with Back Unsupported but Feet Supported on Floor or Stool Able to sit safely and securely 2 minutes    Stand to Sit Controls descent by using hands    Transfers Able to transfer safely, definite need of hands    Standing Unsupported with Eyes Closed Unable to keep eyes closed 3 seconds but stays steady    Standing Unsupported with Feet Together Needs help to attain position but able to stand for 30 seconds with feet together    From Standing, Reach Forward with Outstretched Arm Can reach forward >5 cm safely (2")    From Standing Position, Pick up Object from Floor Unable to pick up and needs supervision    From Standing Position, Turn to Look Behind Over each Shoulder Turn sideways only but maintains balance    Turn 360 Degrees Needs assistance while turning    Standing Unsupported, Alternately Place Feet on Step/Stool Needs assistance to keep from falling or unable to try    Standing Unsupported, One Foot in Front Needs help to step but can hold 15 seconds    Standing on One Leg Tries to lift leg/unable to hold 3 seconds but remains standing independently    Total Score 26    Berg comment: BERG  < 36 high risk for falls (close to 100%) 46-51 moderate (>50%)   37-45 significant (>80%) 52-55 lower (> 25%)              Evaluation on 06/03/2023:   Berg Balance Scale: 19/56  GAIT: 08/20/2023: Patient is able to ambulate 300' with rollator walker modified independent. Patient is able to negotiate curb and ramp with rollator walker modified  independent. Patient ambulates 20 with SBQC & HHAmodA or support on table top or counter.  This enables him to use a 1 hand device for activities in his workshop.   Evaluation on 06/03/2023:    Gait pattern: step to pattern, decreased step length- Left, decreased stance time- Right, decreased hip/knee flexion- Right, decreased ankle dorsiflexion- Right, Right hip hike, knee flexed in stance-  Right, lateral hip instability, trunk flexed, and abducted- Right Distance walked: 25' Assistive device utilized: Environmental consultant - 2 wheeled and TTA prosthesis Level of assistance: Min A balance loss to left on straight path & 180* turn Comments: used O2 with portable unit slung over left shoulder,  pain in residual limb limited distance,   CURRENT PROSTHETIC WEAR ASSESSMENT: 08/20/2023: Patient is independent with: skin check, residual limb care, care of non-amputated limb, prosthetic cleaning, ply sock cleaning, correct ply sock adjustment, proper wear schedule/adjustment, and proper weight-bearing schedule/adjustment Pt independent with donning & doffing.  He tolerates wear >90% of awake hours with no skin issues. He continues to have neuropathy pain that is difficult to distinguish from limb pain.   Evaluation on 06/03/2023:    Patient is dependent with: skin check, residual limb care, care of non-amputated limb, prosthetic cleaning, ply sock cleaning, correct ply sock adjustment, proper wear schedule/adjustment, and proper weight-bearing schedule/adjustment Donning prosthesis: dependent with PT 100% cues on technique Doffing prosthesis: reports modified independent Prosthetic wear tolerance: 2-4 hours, 1x/day, ~3 days/week Prosthetic weight bearing tolerance: 5 minutes with partial weight on prosthesis with pain increasing to 8/10 (intolerable) Edema: pitting with 7 sec capillary refill Residual limb condition: dry skin especially distal limb, 3 scratches with superficial scab on distal limb, scar healed but adhered over entire area of distal tibia, bulbous shape,  Prosthetic description: silicon liner with pin lock suspension, total contact socket with flexible inner socket, dynamic response foot K code/activity level with prosthetic use: Level 3    TODAY'S TREATMENT:                                                                                                                             DATE:   08/20/2023: Prosthetic Care & physical performance testing with TTA prosthesis: See objective data above.   TREATMENT:  DATE:  08/13/2023: Prosthetic Care with TTA prosthesis: Pt amb 25' with [facility] requiring HHA after 5' due to pain & weakness.   PT verbally educated pt & wife on need to see MD ASAP due to wounds on LLE.  If he is told to limit WBing or ends up in the future with left leg amputation, he needs to continue to wear right BKA prosthesis to prevent falls from w/c and improve transfers. Pt & wife verbalized understanding.   Therapeutic Exercise: Seated at edge of chair without back support:   - Seated Hip Flexion Toward Target 5 reps - 5 seconds hold - Seated Eccentric Abdominal Lean Back  - 5 reps - 5 seconds hold - Seated Sidebending  5 reps - 5 seconds hold - Seated Trunk Rotation with Crossed Arms  5 reps - 5 seconds hold -seated LE ext stepping out & back alternating LEs 5 reps - Standing Hip Extension  5 reps - 5 seconds hold - Standing Hip Abduction 5 reps - 5 seconds hold PT added to HEP with HO, demo & verbal cues.  Pt verbalized understanding after performing during session.     TREATMENT:                                                                                                                             DATE:  08/07/2023: Prosthetic Care with TTA prosthesis: PT educated pt & wife in socket revisions and replacement liners every 6 months.  He will need a socket revision ~09/30/23 but needs appt end of May with Dr. Julio Ohm to order it.  PT recommending appt with prosthetist now for pads in socket and dynamic alignment.  PT educated that pads will take up some of space that socks are doing now so will need less socks.  PT recommended using golf cart for long walk to family graveyard then rollator at grave side so he can sit as needed. Pt & wife  agreed with recommendation.   Therapeutic Exercise: - Seated Hamstring Stretch with Strap  2 reps - 30 seconds hold both LEs - Standing Gastroc Stretch on Step  2 reps - 30 seconds hold both LEs - Upright Stance at Door Frame Single Arm 2 reps - 2 deep breathes hold - Upright Stance at Door Frame with Both Arms 2 deep breathes hold PT instructed with HO, demo & verbal cues in above as HEP. Pt verbalized understanding including rationale to help with neuropathy pain.     PATIENT EDUCATION: PATIENT EDUCATED ON FOLLOWING PROSTHETIC CARE: Education details:  Skin check, Residual limb care, Prosthetic cleaning, Correct ply sock adjustment, Propper donning, and Proper wear schedule/adjustment Prosthetic wear tolerance: 2 hours 2x/day, 7 days/week Person educated: Patient and Spouse Education method: Explanation, Demonstration, Tactile cues, and Verbal cues Education comprehension: verbalized understanding, verbal cues required, tactile cues required, and needs further education  HOME EXERCISE PROGRAM: Access Code: [email or web address] URL: [email or web address] Date: 08/13/2023 Prepared by: Lorie Rook  Exercises -  Seated Hamstring Stretch with Strap  - 1-2 x daily - 7 x weekly - 1 sets - 2-3 reps - 30 seconds hold - Standing Gastroc Stretch on Step  - 1-2 x daily - 7 x weekly - 1 sets - 2-3 reps - 30 seconds hold - Upright Stance at Door Frame Single Arm  - 1-3 x daily - 7 x weekly - 1 sets - 2 reps - 2 deep breathes hold - Upright Stance at Door Frame with Both Arms  - 1-3 x daily - 7 x weekly - 1 sets - 2 reps - 2 deep breathes hold - Seated Hip Flexion Toward Target  - 1 x daily - 5 x weekly - 1 sets - 10 reps - 5 seconds hold - Seated Eccentric Abdominal Lean Back  - 1 x daily - 5 x weekly - 1 sets - 10 reps - 5 seconds hold - Seated Sidebending  - 1 x daily - 5 x weekly - 1 sets - 10 reps - 5 seconds hold - Seated Trunk Rotation with Crossed Arms  - 1 x daily - 5 x weekly - 1 sets  - 10 reps - 5 seconds hold - Standing Hip Extension  - 1 x daily - 5 x weekly - 1 sets - 10 reps - 5 seconds hold - Standing Hip Abduction  - 1 x daily - 5 x weekly - 1 sets - 10 reps - 5 seconds hold   Printed and issued 06/25/23 Do each exercise 1-2  times per day Do each exercise 5-10 repetitions Hold each exercise for 2 seconds to feel your location  AT SINK FIND YOUR MIDLINE POSITION AND PLACE FEET EQUAL DISTANCE FROM THE MIDLINE.  Try to find this position when standing still for activities.   USE TAPE ON FLOOR TO MARK THE MIDLINE POSITION which is even with middle of sink.  You also should try to feel with your limb pressure in socket.  You are trying to feel with limb what you used to feel with the bottom of your foot.  Side to Side Shift: Moving your hips only (not shoulders): move weight onto your left leg, HOLD/FEEL pressure in socket.  Move back to equal weight on each leg, HOLD/FEEL pressure in socket. Move weight onto your right leg, HOLD/FEEL pressure in socket. Move back to equal weight on each leg, HOLD/FEEL pressure in socket. Repeat.  Start with both hands on sink, progress to hand on prosthetic side only, then no hands.  Front to Back Shift: Moving your hips only (not shoulders): move your weight forward onto your toes, HOLD/FEEL pressure in socket. Move your weight back to equal Flat Foot on both legs, HOLD/FEEL  pressure in socket. Move your weight back onto your heels, HOLD/FEEL  pressure in socket. Move your weight back to equal on both legs, HOLD/FEEL  pressure in socket. Repeat.  Start with both hands on sink, progress to hand on prosthetic side only, then no hands.  Moving Cones / Cups: With equal weight on each leg: Hold on with one hand the first time, then progress to no hand supports. Move cups from one side of sink to the other. Place cups ~2" out of your reach, progress to 10" beyond reach.  Place one hand in middle of sink and reach with other hand. Do both arms.   Then hover one hand and move cups with other hand.  Overhead/Upward Reaching: alternated reaching up to top cabinets or ceiling if no  cabinets present. Keep equal weight on each leg. Start with one hand support on counter while other hand reaches and progress to no hand support with reaching.  ace one hand in middle of sink and reach with other hand. Do both arms.  Then hover one hand and move cups with other hand.  5.   Looking Over Shoulders: With equal weight on each leg: alternate turning to look over your shoulders with one hand support on counter as needed.  Start with head motions only to look in front of shoulder, then even with shoulder and progress to looking behind you. To look to side, move head /eyes, then shoulder on side looking pulls back, shift more weight to side looking and pull hip back. Place one hand in middle of sink and let go with other hand so your shoulder can pull back. Switch hands to look other way.   Then hover one hand and look over shoulder. If looking right, use left hand at sink. If looking left, use right hand at sink. 6.  Stepping with leg that is not amputated:  Move items under cabinet out of your way. Shift your hips/pelvis so weight on prosthesis. Tighten muscles in hip on prosthetic side.  SLOWLY step other leg so front of foot is in cabinet. Then step back to floor.   ASSESSMENT:  CLINICAL IMPRESSION: Patient appears to improved his balance as indicated by 7 point improvement in the Berg balance test.  He continues to be high risk for falls but is able to perform more standing ADLs safely.  Patient appears to be functioning at a community level with a rollator walker safely.  He continues to be limited by neuropathic pain.  Patient is aware that maintaining flexibility and activity level can help with pain management.  OBJECTIVE IMPAIRMENTS: Abnormal gait, cardiopulmonary status limiting activity, decreased activity tolerance, decreased balance, decreased  endurance, decreased knowledge of condition, decreased knowledge of use of DME, decreased mobility, difficulty walking, decreased ROM, decreased strength, increased edema, prosthetic dependency , and pain.   ACTIVITY LIMITATIONS: carrying, lifting, bending, standing, stairs, transfers, and locomotion level  PARTICIPATION LIMITATIONS: meal prep, cleaning, driving, community activity, and yard work  PERSONAL FACTORS: Fitness, Time since onset of injury/illness/exacerbation, and 3+ comorbidities: see PMH  are also affecting patient's functional outcome.   REHAB POTENTIAL: Good  CLINICAL DECISION MAKING: Evolving/moderate complexity  EVALUATION COMPLEXITY: Moderate   GOALS: Goals reviewed with patient? Yes  SHORT TERM GOALS: Target date: 07/02/2023  Patient donnes prosthesis modified independent & verbalizes proper cleaning. Baseline: SEE OBJECTIVE DATA Goal status:   MET 06/30/2023 2.  Patient tolerates prosthesis >6 hrs total /day without skin issues or limb pain >5/10 after standing. Baseline: SEE OBJECTIVE DATA Goal status: Partially MET 06/30/2023  3.  Patient able to reach 5" and looks to sides without UE support with supervision. Baseline: SEE OBJECTIVE DATA Goal status: MET 07/07/2023  4. Patient ambulates 8' with RW & prosthesis with supervision. Baseline: SEE OBJECTIVE DATA Goal status: MET 07/07/2023  5. Patient negotiates ramps & curbs with RW & prosthesis with minA. Baseline: SEE OBJECTIVE DATA Goal status: MET 06/23/2023  LONG TERM GOALS: Target date: 08/28/2023  Patient demonstrates & verbalized understanding of prosthetic care to enable safe utilization of prosthesis. Baseline: SEE OBJECTIVE DATA Goal status:   Met 08/20/2023  Patient tolerates prosthesis wear >90% of awake hours without skin or limb pain issues. Baseline: SEE OBJECTIVE DATA Goal status: Partially Met 08/20/2023  Berg Balance >36/56 to indicate  lower fall risk Baseline: SEE OBJECTIVE DATA /improved  7 points but not fully met. Goal status: Partially Met 08/20/2023   Patient ambulates >300' with prosthesis & LRAD independently Baseline: SEE OBJECTIVE DATA Goal status: Met 08/20/2023  Patient negotiates ramps, curbs & stairs with single rail with prosthesis & LRAD independently. Baseline: SEE OBJECTIVE DATA Goal status: Met 08/20/2023   PLAN:  PT FREQUENCY: 2x/week  PT DURATION: 12 weeks  PLANNED INTERVENTIONS: 97164- PT Re-evaluation, 97110-Therapeutic exercises, 97530- Therapeutic activity, 97112- Neuromuscular re-education, [phone]- Self Care, 08657- Gait training, [phone]- Prosthetic training, Patient/Family education, Balance training, Stair training, Scar mobilization, Vestibular training, DME instructions, and Physical Performance Testing  PLAN FOR NEXT SESSION:  Discharged PT    Date of referral: 04/17/2023  PT eval delayed by COPD exacerbation Referring provider: Gearldean Keepers, MD Referring diagnosis? Z89.511 (ICD-10-CM) - S/P BKA (below knee amputation), right Treatment diagnosis? (if different than referring diagnosis)   Other abnormalities of gait & mobility R26.89 Unsteadiness on feet R26.81 Pain in left leg M79.605 Pain in right leg M79.604 Muscle Weakness (generalized) M62.81 History of falling Z91.81 Impaired functional mobility, balance, gait and endurance  What was this (referring dx) caused by? Surgery (Type: right BKA)  Nature of Condition: Initial Onset (within last 3 months)  prosthesis delivery 04/01/2023   Laterality: Rt  Current Functional Measure Score: Other Berg Balance 19/56  Objective measurements identify impairments when they are compared to normal values, the uninvolved extremity, and prior level of function.  [x]  Yes  []  No  Objective assessment of functional ability: Severe functional limitations   Briefly describe symptoms: dependent in care & use of prosthesis, impaired balance and gait, pain in both legs  How did symptoms start:  amputation from wounds  Average pain intensity:  Last 24 hours: 8/10  Past week: 8/10  How often does the pt experience symptoms? Constantly  How much have the symptoms interfered with usual daily activities? Extremely  How has condition changed since care began at this facility? NA - initial visit  In general, how is the patients overall health? Good   BACK PAIN (STarT Back Screening Tool) No   Lorie Rook, PT, DPT 08/20/2023, 2:11 PM

## 2023-08-20 NOTE — Patient Instructions (Signed)
 Take prednisone  20 mg once daily for 5 days, then go back to your usual 2.5 mg daily Take doxycycline  1 mg twice a day for 7 days until completely gone Please continue your Breztri  2 puffs twice a day.  Rinse gargle after use. Keep albuterol  available to use either 2 puffs or 1 nebulizer treatment when needed for shortness of breath, chest tightness, wheezing. Please start Daliresp  250 mcg once daily.  If you are doing well on this medication we will increase it to 500 mcg at your next office visit.  Please note that it can sometimes cause stomach bloating and diarrhea.  This usually improves after you get used to the medication. We talked today about possibly starting a new medication at some point called Ohtuvayre  which is a nebulizer that can help with severe COPD. Please use your oxygen  with exertion Follow-up in our office in 1 month to discuss increasing the dose of your Daliresp .

## 2023-08-20 NOTE — Progress Notes (Signed)
 Subjective:    Patient ID: [name], male    DOB: 1950-11-28, 73 y.o.   MRN: [id number]  Asthma He complains of cough and shortness of breath. There is no wheezing. His past medical history is significant for asthma.    ROV 08/20/23 --73 year old man with a history of COPD/asthma, CAD, A-fib/maze/cardioversions, AVR, renal cell carcinoma (ablated 2022), chronic hypoxemic respiratory failure.  He has been managed on Breztri , prednisone  2.5 mg daily.  He had an exacerbation in January that was treated with azithromycin . He reports today more dyspnea. He has cough at night, sometimes dry but often productive. Difficulty breathing supine so he is sleeping in a recliner. Weight up and down. More tan mucous during day, more SOB.    Review of Systems  Constitutional: Negative.   HENT:  Positive for congestion.   Respiratory:  Positive for cough and shortness of breath. Negative for chest tightness and wheezing.   Cardiovascular: Negative.   Gastrointestinal: Negative.   Endocrine: Negative.   Genitourinary: Negative.   Musculoskeletal: Negative.   Allergic/Immunologic: Negative.   Hematological: Negative.   All other systems reviewed and are negative.    Objective:   Physical Exam  Vitals:  Vitals:   08/20/23 1139  BP: (!) 114/56  Pulse: 60  SpO2: 99%  Weight: 202 lb 9.6 oz (91.9 kg)  Height: 5\' 11"  (1.803 m)    Body mass index is 28.26 kg/m. 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  He is on 2.5 mg prednisone .  Frequent flares.  We will treat for an acute flare and bronchitis now.  I will try adding Daliresp .  If he tolerates 250 mcg then we can increase to 500 mcg in 1 month.  Also discussed with him possibly starting Ohtuvayre , could consider rotating antibiotics at the beginning of every month given the chronic bronchitic phenotype.  Take prednisone  20 mg once daily for 5 days, then go back to your usual 2.5 mg daily Take doxycycline  1 mg twice a day for 7 days until completely gone Please continue your Breztri  2 puffs twice a day.  Rinse gargle after use. Keep albuterol  available to use either 2 puffs or 1 nebulizer treatment when needed for shortness of breath, chest tightness, wheezing. Please start Daliresp  250 mcg once daily.  If you are doing well on this medication we will increase it to 500 mcg at your next office visit.  Please note that it can sometimes cause stomach bloating and diarrhea.  This usually improves after you get used to the medication. We talked today about possibly starting a new medication at some point called Ohtuvayre  which is a nebulizer that can help with severe COPD. Please use your oxygen  with exertion Follow-up in our office in 1 month to discuss increasing the dose of your Daliresp .  Time spent 25 minutes   Racheal Buddle, MD, PhD 08/20/2023, 12:08 PM [facility] [phone] or if no answer [phone]

## 2023-08-20 NOTE — Telephone Encounter (Signed)
 Request Reference Number: [id number]. ROFLUMILAST  TAB is approved through 04/28/2024.

## 2023-08-20 NOTE — Telephone Encounter (Signed)
*  [facility]  Pharmacy Patient Advocate Encounter   Received notification from CoverMyMeds that prior authorization for Roflumilast  tablets  is required/requested.   Insurance verification completed.   The patient is insured through [facility] .   Per test claim: PA required; PA submitted to above mentioned insurance via CoverMyMeds Key/confirmation #/EOC [id number] Status is pending

## 2023-08-21 ENCOUNTER — Other Ambulatory Visit: Payer: Self-pay | Admitting: Emergency Medicine

## 2023-08-31 IMAGING — MR MR CERVICAL SPINE W/O CM
4 of 5 series · 16 of 48 positions shown · non-contrast
Comparison: No prior relevant imaging of the cervical spine. CT
lumbar spine 12/10/2020. Abdominopelvic CT 08/09/2020.

CLINICAL DATA: Chronic neck pain extending into the shoulders and
arms with bilateral hand weakness. Chronic low back pain extending
into both legs with numbness. History of back surgery x3.

EXAM:
MRI CERVICAL AND LUMBAR SPINE WITHOUT CONTRAST
TECHNIQUE: Multiplanar and multiecho pulse sequences of the cervical spine, to
include the craniocervical junction and cervicothoracic junction,
and lumbar spine, were obtained without intravenous contrast.

[Series 3: T2 · sagittal · 3.0mm · 0.43mm/px · 6 of 12 slices shown (1 of 2)]
[im 1/12]
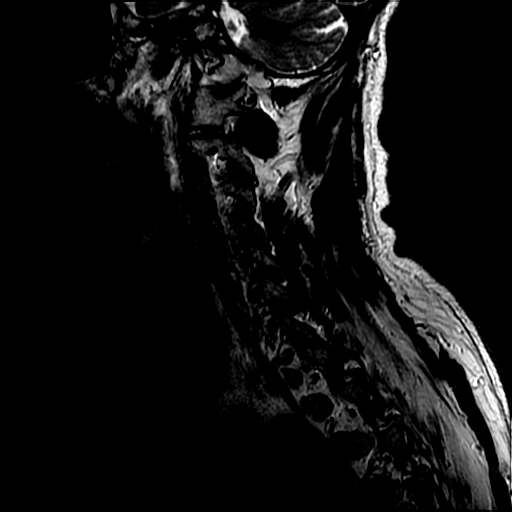
[im 3/12]
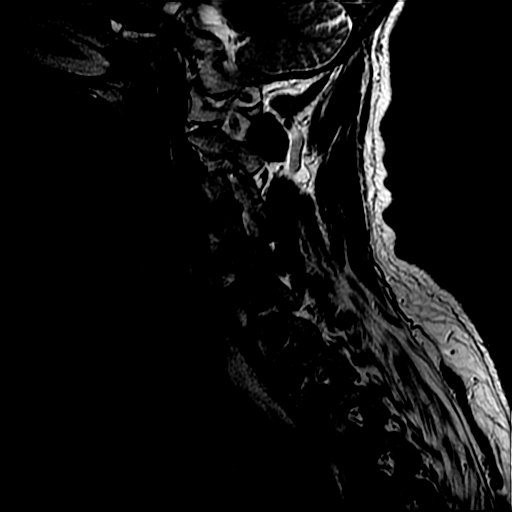
[im 5/12]
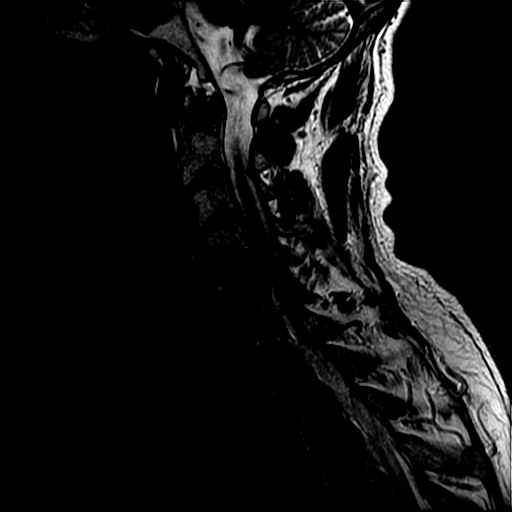
[im 7/12]
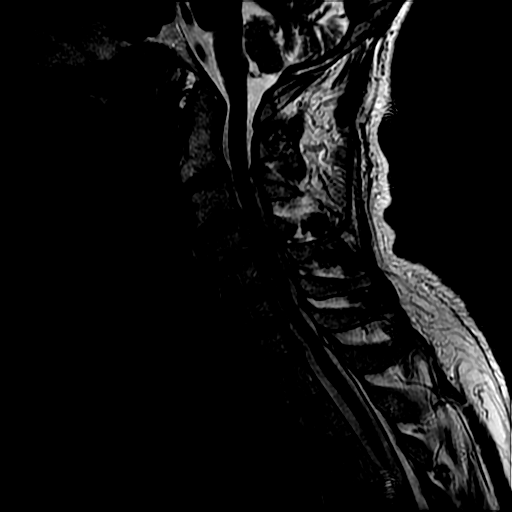
[im 9/12]
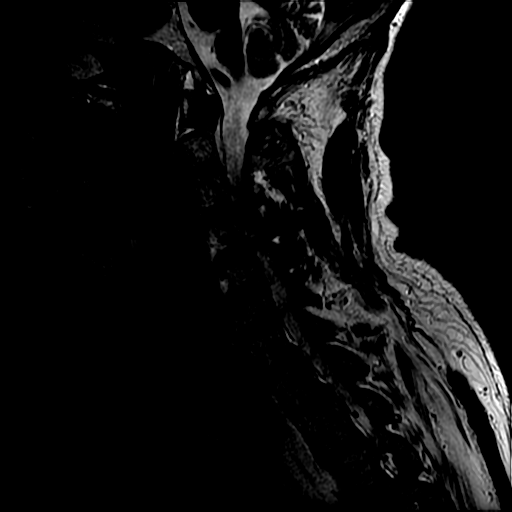
[im 12/12]
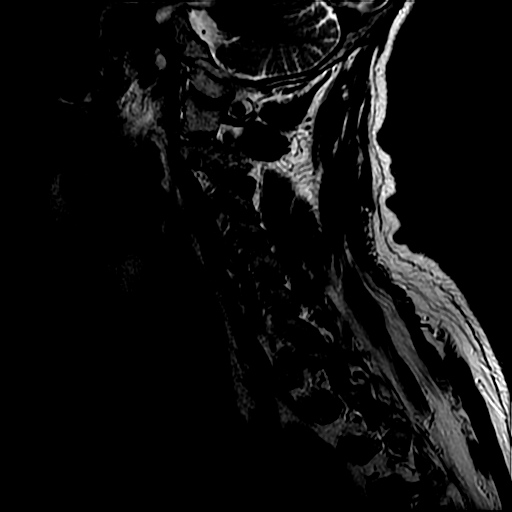

[Series 4: T1 · sagittal · 3.0mm · 0.43mm/px · 3 of 12 slices shown]
[im 3/12]
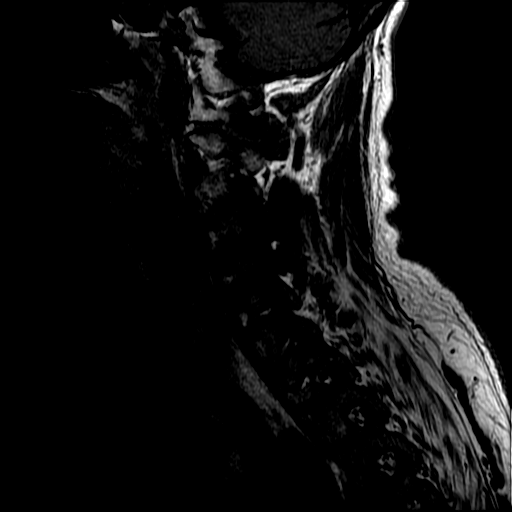
[im 7/12]
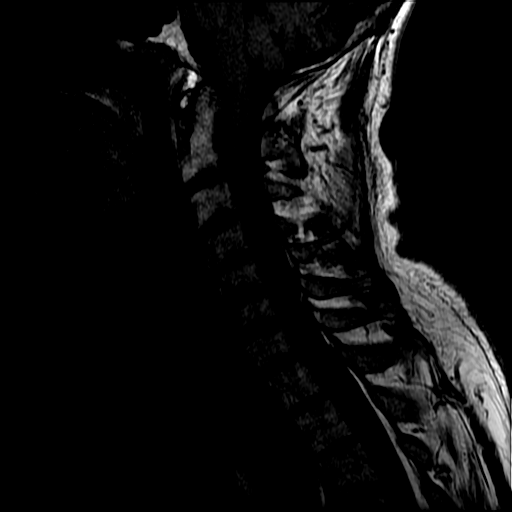
[im 12/12]
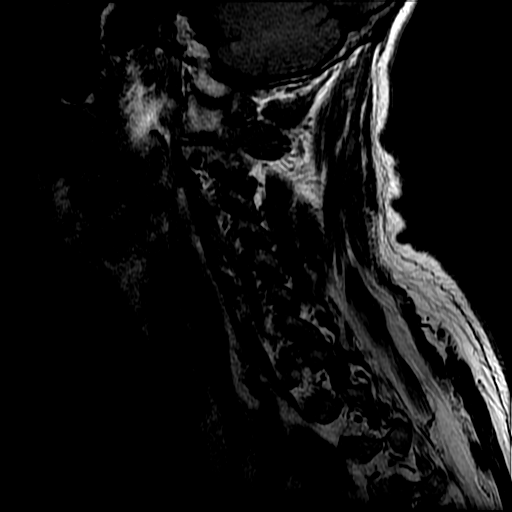

[Series 6: ax 2d merge · axial · 3.0mm · 0.39mm/px · z∈[-48,+18]mm · 3 of 29 slices shown]
[im 5/29]
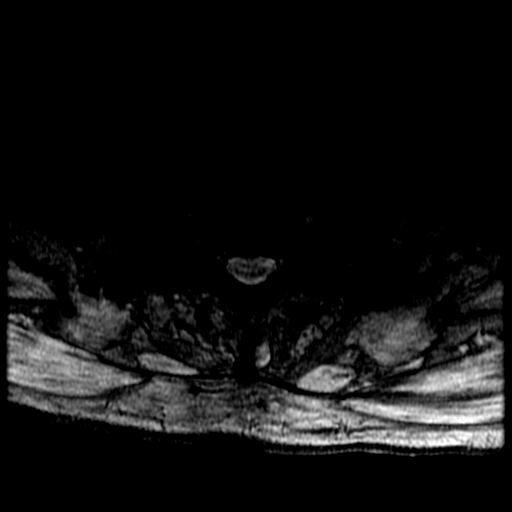
[im 15/29]
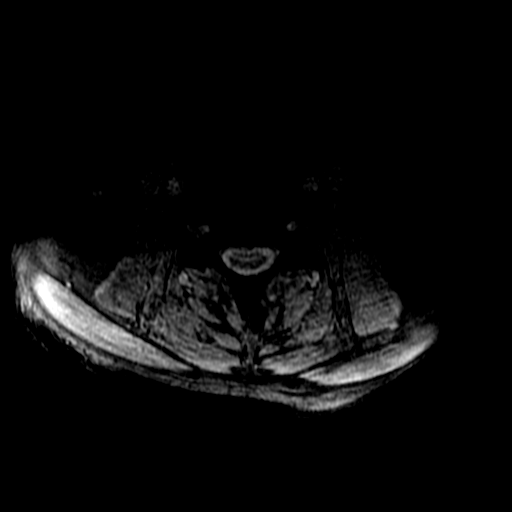
[im 25/29]
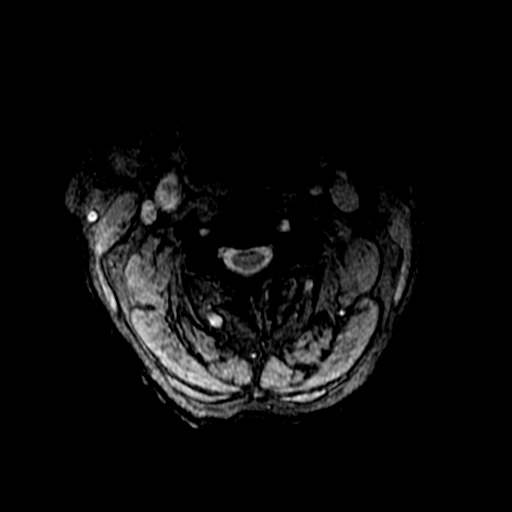

[Series 7: T2 · axial · 3.0mm · 0.39mm/px · z∈[-61,+18]mm · 4 of 29 slices shown (2 of 2)]
[im 1/29]
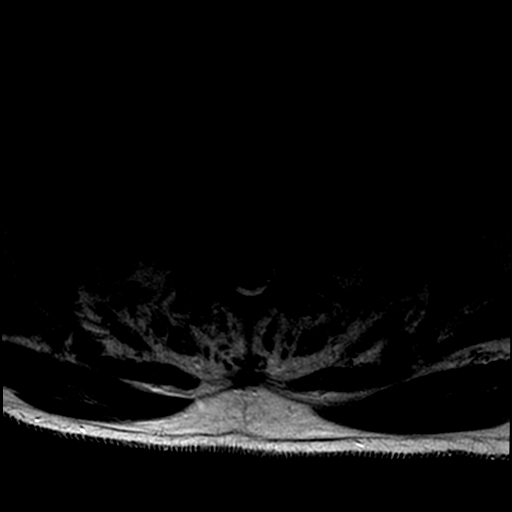
[im 5/29]
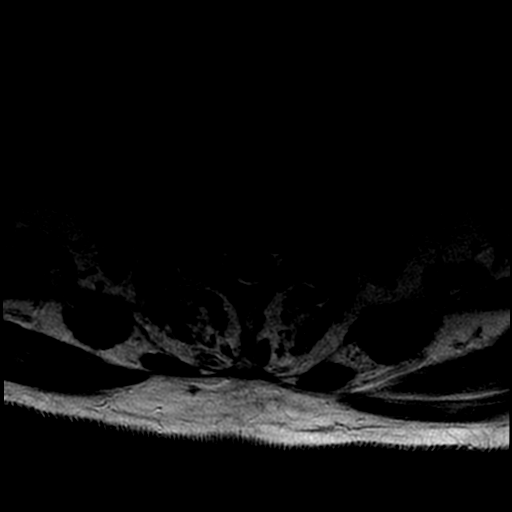
[im 15/29]
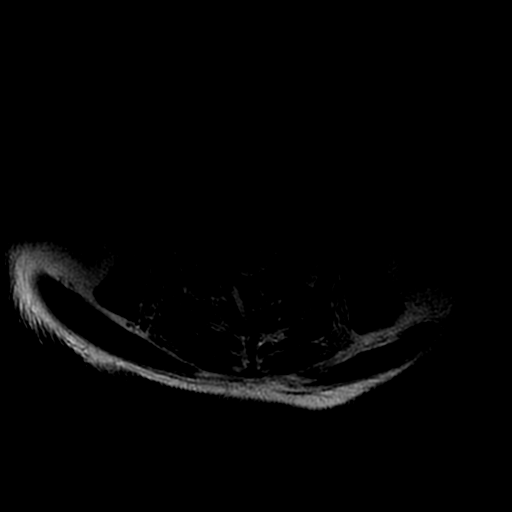
[im 25/29]
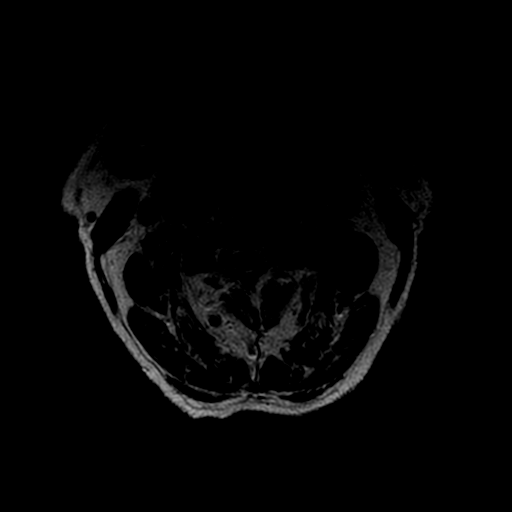

[16 of 48 positions shown; findings below may reference images not displayed]

FINDINGS: Despite efforts by the technologist and patient, mild motion
artifact is present on today's exam and could not be eliminated.
This reduces exam sensitivity and specificity.

MRI CERVICAL SPINE FINDINGS

Alignment: Physiologic.

Vertebrae: No acute or suspicious osseous findings.

Cord: Normal in signal and caliber.

Posterior Fossa, vertebral arteries, paraspinal tissues: Visualized
portions of the posterior fossa appear unremarkable.Bilateral
vertebral artery flow voids. No significant paraspinal findings.

Disc levels:

C2-3: The disc appears normal. Asymmetric facet hypertrophy on the
left contributes to mild left foraminal narrowing. No cord
deformity.

C3-4: Mild disc bulging and uncinate spurring with asymmetric
left-sided facet hypertrophy. No spinal stenosis. Mild left greater
than right foraminal narrowing.

C4-5: Mild loss of disc height with disc bulging and bilateral
uncinate spurring. The CSF surrounding the cord is partially effaced
without cord deformity. Moderate foraminal narrowing is present
bilaterally with potential encroachment on either C5 nerve root.

C5-6: Moderate loss of disc height with posterior osteophytes
covering diffusely bulging disc material and bilateral uncinate
spurring. The CSF surrounding the cord is partially effaced without
cord deformity. Moderate foraminal narrowing bilaterally which could
affect either C6 nerve root.

C6-7: Mild disc bulging and uncinate spurring. Mild spinal stenosis
without cord deformity. The foramina appear sufficiently patent.

C7-T1: Small central disc protrusion and mild bilateral facet
hypertrophy. No spinal stenosis or significant foraminal narrowing.

MRI LUMBAR SPINE FINDINGS

Segmentation: Conventional anatomy assumed, with the last open disc
space designated L5-S1.Concordant with previous imaging.

Alignment:  Physiologic.

Vertebrae: No worrisome osseous lesion, acute fracture or pars
defect. The visualized sacroiliac joints appear unremarkable.

Conus medullaris: Extends to the L1 level and appears normal.

Paraspinal and other soft tissues: No acute paraspinal abnormalities
identified. There is aortic and branch vessel atherosclerosis. There
is chronic right renal cortical scarring. Posteriorly in the
interpolar region of the left kidney is a 2.0 cm T2 hypointense
lesion (image [DATE]) which appears similar in size to abdominopelvic
CT 08/09/2020. This underwent biopsy under ultrasound 11/15/2020.
There is prominent fatty atrophy of the erector spinae musculature
bilaterally.

Disc levels:

No significant disc space findings from T11-12 through L2-3.

L3-4: Stable disc bulging with endplate osteophytes asymmetric to
the right and mild bilateral facet and ligamentous hypertrophy.
Stable asymmetric mild narrowing of the right lateral recess and
right foramen.

L4-5: Left-sided laminectomy. Stable mild disc bulging and bilateral
facet hypertrophy. Mild lateral recess narrowing bilaterally without
nerve root encroachment. The foramina appear sufficiently patent.

L5-S1: Probable postsurgical changes at this level. Mild disc
bulging, facet and ligamentous hypertrophy with mild narrowing of
the lateral recesses, similar to previous CT. The foramina appear
sufficiently patent.
IMPRESSION: 1. Multilevel cervical spondylosis as detailed above. The CSF
surrounding the cord is effaced at C4-5 and C5-6 without cord
deformity or abnormal cord signal.
2. Multilevel foraminal narrowing in the cervical spine which may
affect the exiting nerve roots, greatest bilaterally at C4-5 and
C5-6.
3. Mild lumbar spondylosis which appears similar to previous CT of 5
months ago. There is mild lateral recess narrowing without
high-grade spinal stenosis or definite nerve root encroachment.
4. A previously biopsied left renal lesion is grossly unchanged in
size.

## 2023-09-04 ENCOUNTER — Telehealth: Payer: Self-pay

## 2023-09-04 NOTE — Telephone Encounter (Signed)
 Copied from CRM [id number]. Topic: Clinical - Prescription Issue >> Sep 03, 2023 12:30 PM Alverda Joe S wrote: Reason for CRM: patient wifew calling stating prescription patient has at pharmacy that was sent on 0423 or 04/24 is too much money please call patient to discuss further,

## 2023-09-05 ENCOUNTER — Other Ambulatory Visit: Payer: Self-pay | Admitting: Student

## 2023-09-05 MED ORDER — ROFLUMILAST 250 MCG PO TABS
1.0000 | ORAL_TABLET | Freq: Every day | ORAL | 3 refills | Status: DC
Start: 2023-09-05 — End: 2023-09-26

## 2023-09-05 NOTE — Telephone Encounter (Signed)
 Spoke with Willow and wife. Pts wife stated Daliresp  is too expensive. PA was submitted and is approved. Patient is aware and nothing further needed. New RX sent to [facility]

## 2023-09-08 ENCOUNTER — Encounter: Payer: Self-pay | Admitting: Orthopedic Surgery

## 2023-09-09 ENCOUNTER — Ambulatory Visit (HOSPITAL_COMMUNITY)
Admission: RE | Admit: 2023-09-09 | Discharge: 2023-09-09 | Disposition: A | Discharge status: Home / Self Care | Source: Ambulatory Visit | Attending: Neurosurgery | Admitting: Neurosurgery

## 2023-09-09 DIAGNOSIS — M4802 Spinal stenosis, cervical region: Secondary | ICD-10-CM | POA: Insufficient documentation

## 2023-09-09 NOTE — CV Procedure (Signed)
  Device system confirmed to be MRI conditional, with implant date > 6 weeks ago, and no evidence of abandoned or epicardial leads in review of most recent CXR  Device last cleared by EP Provider: Creighton Doffing 09/03/23   Clearance is good through for 1 year as long as parameters remain stable at time of check. If pt undergoes a cardiac device procedure during that time, they should be re-cleared.   Tachy-therapies to be programmed off if applicable with device back to pre-MRI settings after completion of exam.  [employer] will be present for programming for the MRI.   Evert Hodgkins, RT  09/09/2023 1:29 PM

## 2023-09-15 NOTE — Progress Notes (Signed)
 Remote pacemaker transmission.

## 2023-09-15 NOTE — Addendum Note (Signed)
 Addended by: Lott Rouleau A on: 09/15/2023 11:52 AM   Modules accepted: Orders

## 2023-09-26 ENCOUNTER — Encounter: Payer: Self-pay | Admitting: Primary Care

## 2023-09-26 ENCOUNTER — Ambulatory Visit: Admitting: Primary Care

## 2023-09-26 VITALS — BP 123/69 | HR 61 | Temp 97.5°F | Ht 71.0 in | Wt 202.0 lb

## 2023-09-26 DIAGNOSIS — J449 Chronic obstructive pulmonary disease, unspecified: Secondary | ICD-10-CM | POA: Diagnosis not present

## 2023-09-26 DIAGNOSIS — Z87891 Personal history of nicotine dependence: Secondary | ICD-10-CM

## 2023-09-26 DIAGNOSIS — J9611 Chronic respiratory failure with hypoxia: Secondary | ICD-10-CM | POA: Diagnosis not present

## 2023-09-26 MED ORDER — IPRATROPIUM BROMIDE 0.03 % NA SOLN: 2.0000 | Freq: Two times a day (BID) | NASAL | 5 refills | Status: DC

## 2023-09-26 MED ORDER — ROFLUMILAST 500 MCG PO TABS
500.0000 ug | ORAL_TABLET | Freq: Every day | ORAL | 11 refills | Status: AC
Start: 2023-09-26 — End: ?

## 2023-09-26 NOTE — Progress Notes (Signed)
 @Patient  ID: [name], male    DOB: 10/08/1950, 73 y.o.   MRN: [id number]  Chief Complaint  Patient presents with   Follow-up    F/u on OSA, asthma, and COPD. Uses 2L O2 poc and HS    Referring provider: Barnetta Liberty, MD  HPI: 73 year old male, former smoker quit in 1993 (25 pack year hx). 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 Patient of Dr. Baldwin Levee.  Maintained on Breztri  and supplemental oxygen  as needed.     Previous [facility] pulmonary encounter: ROV 08/20/23 --73 year old man with a history of COPD/asthma, CAD, A-fib/maze/cardioversions, AVR, renal cell carcinoma (ablated 2022), chronic hypoxemic respiratory failure.  He has been managed on Breztri , prednisone  2.5 mg daily.  He had an exacerbation in January that was treated with azithromycin . He reports today more dyspnea. He has cough at night, sometimes dry but often productive. Difficulty breathing supine so he is sleeping in a recliner. Weight up and down. More tan mucous during day, more SOB.    09/26/2023 - Interim hx  Discussed the use of AI scribe software for clinical note transcription with the patient, who gave verbal consent to proceed.  History of Present Illness   [name] is a 73 year old male with severe COPD and chronic bronchitis who presents for a routine follow-up.  He is being followed for severe COPD and chronic bronchitis with recurrent exacerbations. Despite being on daily prednisone  2.5 mg, he experiences frequent exacerbations of bronchitis. He was recently started on Daliresp  250 mcg, but has not noticed any improvement in his symptoms. A flare-up last month required antibiotics and prednisone , but there was no improvement after completing the course.  His  main symptoms include chest and head congestion, with a productive cough yielding light beige mucus. He describes a significant mucous burden with 'a lot of congestion'. He uses oxygen  during exertion and at night.  He is currently using a Breztri  inhaler, two puffs in the morning and two puffs in the evening, consistently. He has not been using expectorants like Mucinex  recently.  He reports some sinus congestion, which he attributes to postnasal drip, but he is not currently using any prescribed nasal spray. No gastrointestinal side effects from Daliresp .      Allergies  Allergen Reactions   Cephalexin     Meloxicam Rash and Other (See Comments)    Red Man Syndrome, also   Vancomycin  Other (See Comments)    Red Man's syndrome 09/02/15, resolved with diphenhydramine  and slowing of rate    Immunization History  Administered Date(s) Administered   Fluad Quad(high Dose 65+) 02/15/2021, 05/15/2022   Influenza Split 01/27/2013, 02/28/2015   Influenza, Quadrivalent, Recombinant, Inj, Pf 01/05/2018, 03/04/2019, 04/05/2020   Influenza,inj,Quad PF,6+ Mos 01/30/2016   Influenza-Unspecified 01/30/2012, 01/27/2014   Pneumococcal Conjugate-13 12/12/2015   Tdap 12/22/2019, 04/15/2023    Past Medical History:  Diagnosis Date   Aortic stenosis, mild    Arthritis of hand    "just a little bit in both hands" (03/31/2012)   Asthma    "little bit" (03/31/2012)   Atrial fibrillation (HCC)    dx '04; DCCV '04, placed on flecainide, failed DCCV 04/2010, flecainide stopped; s/p successful A.fib ablation 01/31/12   CHF (congestive heart failure) (HCC)    Controlled type 2 diabetes mellitus without complication, without long-term current use of  insulin  (HCC) 12/13/2019   COPD (chronic obstructive pulmonary disease) (HCC)    DJD (degenerative joint disease)    Fatty liver    Fibromyalgia    GERD (gastroesophageal reflux disease)    Hyperlipidemia    Hypertension    Hypothyroidism    S/P radiation    Left atrial enlargement    LA size 45mm by echo 11/21/11   Mitral regurgitation    trivial   Obstructive sleep apnea    mild by sleep study 2013; pt stated he does not have a machine because "it wasn't bad enough for him to have one"   Pacemaker 03/31/2012   Pneumonia    last time 2023   Restless leg syndrome    Second degree AV block    Splenomegaly     Tobacco History: Social History   Tobacco Use  Smoking Status Former   Current packs/day: 0.00   Average packs/day: 1 pack/day for 30.2 years (30.2 ttl pk-yrs)   Types: Cigarettes   Start date: 1963   Quit date: 07/18/1991   Years since quitting: 32.2   Passive exposure: Never  Smokeless Tobacco Never   Counseling given: Not Answered   Outpatient Medications Prior to Visit  Medication Sig Dispense Refill   acetaminophen  (TYLENOL ) 325 MG tablet Take 2 tablets (650 mg total) by mouth every 6 (six) hours as needed.     albuterol  (VENTOLIN  HFA) 108 (90 Base) MCG/ACT inhaler Inhale 2 puffs into the lungs every 6 (six) hours as needed for wheezing or shortness of breath. 1 each 5   amLODipine  (NORVASC ) 5 MG tablet Take 0.5 tablets (2.5 mg total) by mouth in the morning. 15 tablet 11   apixaban  (ELIQUIS ) 5 MG TABS tablet TAKE 1 TABLET(5 MG) BY MOUTH TWICE DAILY 180 tablet 0   bisoprolol  (ZEBETA ) 5 MG tablet Take 1 tablet (5 mg total) by mouth daily. Please call office to arrange follow up appointment 90 tablet 3   Budeson-Glycopyrrol-Formoterol  (BREZTRI  AEROSPHERE) 160-9-4.8 MCG/ACT AERO Inhale 2 puffs into the lungs in the morning and at bedtime. 32.1 g 3   Cholecalciferol  (VITAMIN D ) 50 MCG (2000 UT) tablet Take 2 tablets (4,000 Units total) by mouth daily.     empagliflozin  (JARDIANCE ) 10 MG TABS tablet Take 1 tablet (10 mg total) by mouth daily. 30 tablet 6   gabapentin  (NEURONTIN ) 300 MG capsule Take 2 capsules (600 mg total) by mouth 3 (three) times daily. for neuropathy 180 capsule 3   ipratropium-albuterol  (DUONEB) 0.5-2.5  (3) MG/3ML SOLN Take 3 mLs by nebulization every 6 (six) hours as needed. 120 mL 3   levothyroxine  (SYNTHROID , LEVOTHROID) 175 MCG tablet Take 175 mcg by mouth daily before breakfast.     Menthol -Camphor (TIGER BALM ARTHRITIS RUB EX) Apply 1 application topically as needed (for leg pain).     montelukast  (SINGULAIR ) 10 MG tablet Take 1 tablet (10 mg total) by mouth at bedtime. 30 tablet 11   Multiple Minerals-Vitamins (CAL-MAG-ZINC -D) TABS Take 1 tablet by mouth at bedtime.     Multiple Vitamin (MULTIVITAMIN WITH MINERALS) TABS tablet Take 1 tablet by mouth daily. 30 tablet 0   ondansetron  (ZOFRAN ) 4 MG tablet Take 1 tablet (4 mg total) by mouth every 6 (six) hours. 12 tablet 0   oxyCODONE -acetaminophen  (PERCOCET/ROXICET) 5-325 MG tablet Take 1 tablet by mouth every 6 (six) hours as needed for severe pain (pain score 7-10). 10 tablet 0   OXYGEN  Inhale 2 L/min into the lungs continuous.     pantoprazole  (  PROTONIX ) 40 MG tablet Take 40 mg by mouth daily before breakfast.     Polyethyl Glyc-Propyl Glyc PF (SYSTANE ULTRA PF) 0.4-0.3 % SOLN Place 2 drops into both eyes daily as needed (dry eyes).     polyethylene glycol (MIRALAX  / GLYCOLAX ) 17 g packet Take 17 g by mouth 2 (two) times daily as needed. 14 each 0   potassium chloride  SA (KLOR-CON  M) 20 MEQ tablet Take 20 mEq by mouth 2 (two) times daily.     predniSONE  (DELTASONE ) 2.5 MG tablet Take 1 tablet (2.5 mg total) by mouth daily with breakfast. 90 tablet 3   Roflumilast  250 MCG TABS Take 1 tablet by mouth daily. 90 tablet 3   rOPINIRole  (REQUIP ) 1 MG tablet Take 1 tablet (1 mg total) by mouth in the morning and at bedtime. (Patient taking differently: Take 1 mg by mouth daily.)     rosuvastatin  (CRESTOR ) 10 MG tablet Take 10 mg by mouth at bedtime.      senna-docusate (SENOKOT-S) 8.6-50 MG tablet Take 1 tablet by mouth 2 (two) times daily.     spironolactone  (ALDACTONE ) 25 MG tablet Take 1 tablet (25 mg total) by mouth daily. 90 tablet 3    torsemide  (DEMADEX ) 20 MG tablet Take 2 tablets (40 mg total) by mouth daily. 60 tablet 8   doxycycline  (VIBRA -TABS) 100 MG tablet Take 1 tablet (100 mg total) by mouth 2 (two) times daily. (Patient not taking: Reported on 09/26/2023) 14 tablet 0   oxyCODONE -acetaminophen  (PERCOCET/ROXICET) 5-325 MG tablet Take 1 tablet by mouth every 8 (eight) hours as needed for severe pain. (Patient not taking: Reported on 09/26/2023) 20 tablet 0   predniSONE  (DELTASONE ) 20 MG tablet Take 1 tablet (20 mg total) by mouth daily with breakfast. (Patient not taking: Reported on 09/26/2023) 5 tablet 0   No facility-administered medications prior to visit.    Review of Systems  Review of Systems  Constitutional: Negative.   HENT:  Positive for congestion.   Respiratory:  Positive for cough.   Cardiovascular: Negative.     Physical Exam  BP 123/69 (BP Location: Left Arm, Patient Position: Sitting, Cuff Size: Normal)   Pulse 61   Temp (!) 97.5 F (36.4 C) (Temporal)   Ht 5\' 11"  (1.803 m)   Wt 202 lb (91.6 kg)   SpO2 97%   BMI 28.17 kg/m  Physical Exam Constitutional:      General: He is not in acute distress.    Appearance: Normal appearance. He is not ill-appearing.  HENT:     Head: Normocephalic and atraumatic.     Mouth/Throat:     Mouth: Mucous membranes are moist.     Pharynx: Oropharynx is clear.  Cardiovascular:     Rate and Rhythm: Normal rate and regular rhythm.  Pulmonary:     Effort: Pulmonary effort is normal.     Breath sounds: Normal breath sounds. No wheezing or rales.     Comments: CTA, diminished  Musculoskeletal:        General: Normal range of motion.     Comments: Transport WC  Skin:    General: Skin is warm and dry.  Neurological:     General: No focal deficit present.     Mental Status: He is alert and oriented to person, place, and time. Mental status is at baseline.  Psychiatric:        Mood and Affect: Mood normal.        Behavior: Behavior normal.  Thought  Content: Thought content normal.        Judgment: Judgment normal.      Lab Results:  CBC    Component Value Date/Time   WBC 7.1 04/15/2023 1115   RBC 4.35 04/15/2023 1115   HGB 9.6 (L) 04/15/2023 1115   HGB 14.6 06/25/2021 1152   HCT 34.2 (L) 04/15/2023 1115   HCT 43.2 06/25/2021 1152   PLT 175 04/15/2023 1115   PLT 163 06/25/2021 1152   MCV 78.6 (L) 04/15/2023 1115   MCV 87 06/25/2021 1152   MCH 22.1 (L) 04/15/2023 1115   MCHC 28.1 (L) 04/15/2023 1115   RDW 19.2 (H) 04/15/2023 1115   RDW 14.9 06/25/2021 1152   LYMPHSABS 0.8 04/15/2023 1115   LYMPHSABS 1.0 07/19/2020 1025   MONOABS 0.7 04/15/2023 1115   EOSABS 0.1 04/15/2023 1115   EOSABS 0.1 07/19/2020 1025   BASOSABS 0.0 04/15/2023 1115   BASOSABS 0.0 07/19/2020 1025    BMET    Component Value Date/Time   NA 133 (L) 06/12/2023 1121   NA 135 06/25/2021 1152   K 4.4 06/12/2023 1121   CL 101 06/12/2023 1121   CO2 25 06/12/2023 1121   GLUCOSE 114 (H) 06/12/2023 1121   BUN 22 06/12/2023 1121   BUN 29 (H) 06/25/2021 1152   CREATININE 1.52 (H) 06/12/2023 1121   CREATININE 1.65 (H) 01/29/2016 1543   CALCIUM  9.1 06/12/2023 1121   GFRNONAA 48 (L) 06/12/2023 1121   GFRAA 41 (L) 03/29/2020 1043    BNP    Component Value Date/Time   BNP 180.3 (H) 04/15/2023 1115   BNP 406.6 (H) 01/29/2016 1543    ProBNP    Component Value Date/Time   PROBNP 470.0 (H) 01/15/2016 1708    Imaging: No results found.   Assessment & Plan:   1. COPD mixed type (HCC) (Primary)  2. Chronic respiratory failure with hypoxia (HCC)     Severe COPD with chronic bronchitis Severe COPD with chronic bronchitis, characterized by frequent exacerbations and persistent symptoms despite current treatment. Recent treatment with doxycycline  and prednisone  showed no improvement. Current symptoms include chest congestion and productive cough with light beige sputum. He is on a loading dose of Daliresp  (250 mcg) with plans to increase to 500  mcg. No GI side effects reported from Daliresp . He uses Breztri  inhaler consistently and oxygen  during exertion and at night. Mucinex  is recommended to thin mucus and facilitate clearance. - Increase Daliresp  to 500 mcg once daily. - Restart Mucinex  (guaifenesin ) 600 mg, 1-2 times daily. - Educate on cough/deep breathing exercises to clear lungs. - Consider adding nebulizer Ohtuvayre  or cycling antibiotics if significant symptoms persist after 6 weeks.  Congestion with postnasal drip Congestion with postnasal drip contributing to overall symptom burden. No current prescribed nasal spray, but he has been using an over-the-counter option. Ipratropium nasal spray is recommended to address postnasal drip. - Prescribe ipratropium nasal spray to address postnasal drip. - Send prescriptions to mail order pharmacy.   Antonio Baumgarten, NP 09/26/2023

## 2023-09-26 NOTE — Patient Instructions (Signed)
-  SEVERE COPD WITH CHRONIC BRONCHITIS: Chronic Obstructive Pulmonary Disease (COPD) is a long-term lung condition that makes it hard to breathe, and chronic bronchitis is a type of COPD that involves a persistent cough with mucus. Despite your current treatment, you are still experiencing frequent flare-ups and symptoms. We will increase your Daliresp  dose to 500 mcg once daily and restart Mucinex  (guaifenesin ) 600 mg, 1-2 times daily to help thin the mucus. Additionally, we recommend practicing huff coughing and deep breathing exercises to help clear your lungs. If your symptoms do not improve after 6 weeks, we may consider adding a nebulizer treatment or cycling antibiotics.  -CONGESTION WITH POSTNASAL DRIP: Postnasal drip occurs when excess mucus from the nose drips down the back of the throat, causing congestion. To help with this, we will prescribe ipratropium nasal spray. Your prescriptions will be sent to the mail order pharmacy.  INSTRUCTIONS: Please follow up in 6 weeks to assess your response to the increased Daliresp  dose and other treatments. If your symptoms worsen or do not improve, contact our office for further evaluation.  Follow-up 6-8 weeks with Dr. Baldwin Levee or Jerlene Moody NP

## 2023-10-14 ENCOUNTER — Telehealth: Payer: Self-pay | Admitting: Pharmacy Technician

## 2023-10-14 NOTE — Telephone Encounter (Signed)
 Auth Submission: NO AUTH NEEDED Site of care: Site of care: [facility] Payer: [employer] medicare Medication & CPT/J Code(s) submitted: Feraheme (ferumoxytol) [id number] Diagnosis Code:  Route of submission (phone, fax, portal): portal Phone # Fax # Auth type: Buy/Bill HB Units/visits requested: x2 doses Reference number: [id number] Approval from: 10/14/23 to 02/13/24

## 2023-10-15 ENCOUNTER — Other Ambulatory Visit: Payer: Self-pay | Admitting: Emergency Medicine

## 2023-10-16 ENCOUNTER — Other Ambulatory Visit: Payer: Self-pay | Admitting: Orthopedic Surgery

## 2023-10-20 ENCOUNTER — Encounter: Payer: Self-pay | Admitting: Orthopedic Surgery

## 2023-10-20 ENCOUNTER — Ambulatory Visit: Admitting: Orthopedic Surgery

## 2023-10-20 DIAGNOSIS — Z89511 Acquired absence of right leg below knee: Secondary | ICD-10-CM | POA: Diagnosis not present

## 2023-10-20 NOTE — Progress Notes (Signed)
 Office Visit Note   Patient: [name]           Date of Birth: Jun 06, 1950           MRN: [id number] Visit Date: 10/20/2023              Requested by: Larnell Hamilton, MD [address] [place],  [place] [address] PCP: Larnell Hamilton, MD  Chief Complaint  Patient presents with   Right Leg - Follow-up    Hx revision right BKA 10/2022      HPI: Patient is a 73 year old gentleman who is seen in follow-up for right below-knee amputation.  Patient states he has had significant decrease in volume in the right lower extremity with a poor fitting socket without rotational stability.  Patient states that with end bearing into the socket he has had ulcerations develop.   Assessment & Plan: Visit Diagnoses:  1. S/P BKA (below knee amputation), right (HCC)     Plan: Patient was provided a prescription for [employer] for new socket liner materials and supplies.  Follow-Up Instructions: Return if symptoms worsen or fail to improve.   Ortho Exam  Patient is alert, oriented, no adenopathy, well-dressed, normal affect, normal respiratory effort. Examination the ulcers have healed on the right lower extremity has decreased residual volume and is unable to safely wear his current socket.  Patient is an existing right transtibial  amputee.  Patient's current comorbidities are not expected to impact the ability to function with the prescribed prosthesis. Patient verbally communicates a strong desire to use a prosthesis. Patient currently requires mobility aids to ambulate without a prosthesis.  Expects not to use mobility aids with a new prosthesis. Patient is expected to resume or reach their K Level within 6 months. Patient was active before the amputation and independent with stairs, uneven terrain, varying cadence, and a community ambulator.  Patient is a K2 level ambulator that will use a prosthesis to walk around their home and the community over low level environmental barriers.         Imaging: No results found. No images are attached to the encounter.  Labs: Lab Results  Component Value Date   HGBA1C 6.3 (H) 11/13/2022   HGBA1C 6.5 (H) 12/27/2021   HGBA1C 6.6 (H) 06/11/2021   ESRSEDRATE 60 (H) 09/04/2015   REPTSTATUS 10/22/2022 FINAL 10/17/2022   GRAMSTAIN NO WBC SEEN NO ORGANISMS SEEN  10/09/2022   CULT  10/17/2022    NO GROWTH 5 DAYS Performed at [facility], [address], [place], [place] [address]    [facility] STAPHYLOCOCCUS AUREUS 10/09/2022   LABORGA STAPHYLOCOCCUS EPIDERMIDIS 10/09/2022     Lab Results  Component Value Date   ALBUMIN  3.4 (L) 04/15/2023   ALBUMIN  3.9 02/25/2023   ALBUMIN  3.5 01/31/2023   PREALBUMIN 25 09/04/2022    Lab Results  Component Value Date   MG 2.4 12/23/2022   MG 2.0 06/11/2021   MG 2.3 12/10/2020   No results found for: [facility]  Lab Results  Component Value Date   PREALBUMIN 25 09/04/2022      Latest Ref Rng & Units 04/15/2023   11:15 AM 02/25/2023   10:05 AM 01/31/2023    8:50 AM  CBC EXTENDED  WBC 4.0 - 10.5 K/uL 7.1  9.8    9.6  10.4   RBC 4.22 - 5.81 MIL/uL 4.35  4.84    4.87  4.40   Hemoglobin 13.0 - 17.0 g/dL 9.6  88.4    11.4  10.5   HCT 39.0 - 52.0 % 34.2  38.5    38.6  35.6   Platelets 150 - 400 K/uL 175  206    207  208   NEUT# 1.7 - 7.7 K/uL 5.4  7.9    Lymph# 0.7 - 4.0 K/uL 0.8  0.8       There is no height or weight on file to calculate BMI.  Orders:  No orders of the defined types were placed in this encounter.  No orders of the defined types were placed in this encounter.    Procedures: No procedures performed  Clinical Data: No additional findings.  ROS:  All other systems negative, except as noted in the HPI. Review of Systems  Objective: Vital Signs: There were no vitals taken for this visit.  Specialty Comments:  No specialty comments available.  PMFS History: Patient Active Problem List   Diagnosis Date Noted   AKI (acute kidney injury)  (HCC) 12/22/2022   Fall 11/14/2022   Pain of right lower extremity due to injury 11/14/2022   Diabetic ulcer of lower leg (HCC) 11/13/2022   Osteomyelitis (HCC) 11/13/2022   Dyslipidemia 10/18/2022   Cellulitis of right lower extremity 10/17/2022   Dehiscence of amputation stump of right lower extremity (HCC) 10/09/2022   Below-knee amputation of right lower extremity (HCC) 10/09/2022   Gangrene of right foot (HCC) 09/04/2022   History of below-knee amputation of right lower extremity (HCC) 09/04/2022   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) 08/23/2022   CAP (community acquired pneumonia) 12/28/2021   Constipation 12/28/2021   Pain in both lower extremities 07/03/2021   Peripheral neuropathy 07/03/2021   Acute metabolic encephalopathy 06/09/2021   Marijuana dependence (HCC) 06/09/2021   DNR (do not resuscitate) 06/09/2021   Chest pain 06/08/2021   Renal lesion 02/14/2021   Status post cryoablation 02/14/2021   Allergic rhinitis 12/08/2020   Preop pulmonary/respiratory exam 03/27/2020   Acute on chronic diastolic (congestive) heart failure (HCC) 01/22/2020   Abdominal pain 01/22/2020   Hypothyroidism 12/13/2019   DM2 (diabetes mellitus, type 2) (HCC) 12/13/2019   Chronic kidney disease, stage 3b (HCC) 12/13/2019   Acute bacterial bronchitis 12/13/2019   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   Typical atrial flutter (HCC)    Chronic respiratory failure with hypoxia (HCC) 05/23/2016   CAD (coronary artery disease) 05/23/2016   Persistent atrial fibrillation (HCC)    Atypical atrial flutter (HCC)    Visit for monitoring Tikosyn  therapy 04/16/2016   Chronic diastolic CHF (congestive heart failure) (HCC) 02/03/2016   Cough 12/06/2015   Acute on chronic respiratory failure with hypoxia (HCC) 09/08/2015   Hallucinations 09/04/2015   Lymphadenopathy 09/04/2015   Ascending aortic aneurysm (HCC) 09/04/2015   Hypoxia 09/02/2015   S/P aortic valve replacement with bioprosthetic  valve 09/02/2015   Pleural effusion 09/02/2015   S/P AVR 08/22/2015   GERD without esophagitis 09/20/2014   Intrinsic asthma 10/13/2013   Dyspnea 09/15/2013   Pulmonary hypertension (HCC) 09/15/2013   Shortness of breath 05/05/2013   Pacemaker-St.Jude 04/01/2012   Bradycardia 03/31/2012   Second degree AV block 03/31/2012   AV block, 1st degree 02/01/2012   PAF (paroxysmal atrial fibrillation) (HCC) 11/13/2011   Fatigue 11/13/2011   Essential hypertension 11/13/2011   Aortic stenosis 11/13/2011   Sleep apnea 11/13/2011   Past Medical History:  Diagnosis Date   Aortic stenosis, mild    Arthritis of hand    just a little bit in both hands (03/31/2012)  Asthma    little bit (03/31/2012)   Atrial fibrillation (HCC)    dx '04; DCCV '04, placed on flecainide, failed DCCV 04/2010, flecainide stopped; s/p successful A.fib ablation 01/31/12   CHF (congestive heart failure) (HCC)    Controlled type 2 diabetes mellitus without complication, without long-term current use of insulin  (HCC) 12/13/2019   COPD (chronic obstructive pulmonary disease) (HCC)    DJD (degenerative joint disease)    Fatty liver    Fibromyalgia    GERD (gastroesophageal reflux disease)    Hyperlipidemia    Hypertension    Hypothyroidism    S/P radiation   Left atrial enlargement    LA size 45mm by echo 11/21/11   Mitral regurgitation    trivial   Obstructive sleep apnea    mild by sleep study 2013; pt stated he does not have a machine because it wasn't bad enough for him to have one   Pacemaker 03/31/2012   Pneumonia    last time 2023   Restless leg syndrome    Second degree AV block    Splenomegaly     Family History  Problem Relation Age of Onset   Heart failure Mother    Stroke Father    Neuropathy Neg Hx     Past Surgical History:  Procedure Laterality Date   AMPUTATION Right 08/23/2022   Procedure: RIGHT FOOT FIFTH RAY AMPUTATION;  Surgeon: Harden Jerona GAILS, MD;  Location: [facility] OR;  Service:  Orthopedics;  Laterality: Right;   AMPUTATION Right 09/04/2022   Procedure: RIGHT BELOW KNEE AMPUTATION;  Surgeon: Harden Jerona GAILS, MD;  Location: [facility] OR;  Service: Orthopedics;  Laterality: Right;   AORTIC VALVE REPLACEMENT N/A 08/22/2015   Procedure: AORTIC VALVE REPLACEMENT (AVR);  Surgeon: Maude Fleeta Ochoa, MD;  Location: [facility] OR;  Service: Open Heart Surgery;  Laterality: N/A;   ATRIAL FIBRILLATION ABLATION  01/30/2012   PVI by Dr. Kelsie   ATRIAL FIBRILLATION ABLATION N/A 01/31/2012   Procedure: ATRIAL FIBRILLATION ABLATION;  Surgeon: Lynwood Kelsie, MD;  Location: [facility] CATH LAB;  Service: Cardiovascular;  Laterality: N/A;   ATRIAL FIBRILLATION ABLATION N/A 04/19/2020   Procedure: ATRIAL FIBRILLATION ABLATION;  Surgeon: Kelsie Lynwood, MD;  Location: [facility] INVASIVE CV LAB;  Service: Cardiovascular;  Laterality: N/A;   BACK SURGERY     X 3   BIOPSY  01/01/2022   Procedure: BIOPSY;  Surgeon: Elicia Claw, MD;  Location: [facility] ENDOSCOPY;  Service: Gastroenterology;;   CARDIAC CATHETERIZATION N/A 08/09/2015   Procedure: Right/Left Heart Cath and Coronary Angiography;  Surgeon: Peter M Swaziland, MD;  Location: [facility] INVASIVE CV LAB;  Service: Cardiovascular;  Laterality: N/A;   CARDIAC CATHETERIZATION N/A 02/05/2016   Procedure: Right/Left Heart Cath and Coronary Angiography;  Surgeon: Candyce GORMAN Reek, MD;  Location: [facility] INVASIVE CV LAB;  Service: Cardiovascular;  Laterality: N/A;   CARDIAC CATHETERIZATION N/A 04/17/2016   Procedure: Right Heart Cath;  Surgeon: Toribio JONELLE Fuel, MD;  Location: [facility] INVASIVE CV LAB;  Service: Cardiovascular;  Laterality: N/A;   CARDIOVASCULAR STRESS TEST  03/12/2002   EF 48%, NO EVIDENCE OF ISCHEMIA   CARDIOVERSION  01/2012; 03/31/2012   CARDIOVERSION N/A 02/25/2012   Procedure: CARDIOVERSION;  Surgeon: Lynwood Kelsie, MD;  Location: [facility] CATH LAB;  Service: Cardiovascular;  Laterality: N/A;   CARDIOVERSION N/A 03/31/2012   Procedure: CARDIOVERSION;  Surgeon: Lynwood Kelsie, MD;   Location: [facility] CATH LAB;  Service: Cardiovascular;  Laterality: N/A;   CARDIOVERSION N/A 11/23/2015   Procedure: CARDIOVERSION;  Surgeon: Ezra GORMAN  Rolan, MD;  Location: [facility] ENDOSCOPY;  Service: Cardiovascular;  Laterality: N/A;   CARDIOVERSION N/A 04/08/2016   Procedure: CARDIOVERSION;  Surgeon: Toribio JONELLE Fuel, MD;  Location: [facility] ENDOSCOPY;  Service: Cardiovascular;  Laterality: N/A;   CARDIOVERSION N/A 09/14/2018   Procedure: CARDIOVERSION;  Surgeon: Delford Maude BROCKS, MD;  Location: [facility] ENDOSCOPY;  Service: Cardiovascular;  Laterality: N/A;   CARDIOVERSION N/A 10/19/2019   Procedure: CARDIOVERSION;  Surgeon: Fuel Toribio JONELLE, MD;  Location: [facility] ENDOSCOPY;  Service: Cardiovascular;  Laterality: N/A;   CARDIOVERSION N/A 02/25/2020   Procedure: CARDIOVERSION;  Surgeon: Okey Vina GAILS, MD;  Location: [facility] ENDOSCOPY;  Service: Cardiovascular;  Laterality: N/A;   CARDIOVERSION N/A 05/25/2020   Procedure: CARDIOVERSION;  Surgeon: Pietro Redell RAMAN, MD;  Location: [facility] ENDOSCOPY;  Service: Cardiovascular;  Laterality: N/A;   CARDIOVERSION N/A 07/10/2020   Procedure: CARDIOVERSION;  Surgeon: Jeffrie Oneil BROCKS, MD;  Location: [facility] ENDOSCOPY;  Service: Cardiovascular;  Laterality: N/A;   CLIPPING OF ATRIAL APPENDAGE N/A 08/22/2015   Procedure: CLIPPING OF ATRIAL APPENDAGE;  Surgeon: Maude Fleeta Ochoa, MD;  Location: [facility] OR;  Service: Open Heart Surgery;  Laterality: N/A;   COLONOSCOPY WITH PROPOFOL  N/A 01/01/2022   Procedure: COLONOSCOPY WITH PROPOFOL ;  Surgeon: Elicia Claw, MD;  Location: [facility] ENDOSCOPY;  Service: Gastroenterology;  Laterality: N/A;   DOPPLER ECHOCARDIOGRAPHY  03/11/2002   EF 70-75%   FINGER SURGERY Left    Middle finger   FINGER TENDON REPAIR  1980's   right little finger (03/31/2012)   FLEXIBLE SIGMOIDOSCOPY N/A 12/30/2021   Procedure: FLEXIBLE SIGMOIDOSCOPY;  Surgeon: Dianna Specking, MD;  Location: [facility] ENDOSCOPY;  Service: Gastroenterology;  Laterality: N/A;   INSERT / REPLACE / REMOVE  PACEMAKER     St Jude   IR RADIOLOGIST EVAL & MGMT  05/06/2019   IR RADIOLOGIST EVAL & MGMT  10/06/2019   IR RADIOLOGIST EVAL & MGMT  10/31/2020   IR RADIOLOGIST EVAL & MGMT  01/18/2021   IR RADIOLOGIST EVAL & MGMT  03/08/2021   IR RADIOLOGIST EVAL & MGMT  06/13/2021   KIDNEY SURGERY Left    LEFT AND RIGHT HEART CATHETERIZATION WITH CORONARY ANGIOGRAM N/A 10/27/2012   Procedure: LEFT AND RIGHT HEART CATHETERIZATION WITH CORONARY ANGIOGRAM;  Surgeon: Erick JONELLE Bergamo, MD;  Location: [facility] CATH LAB;  Service: Cardiovascular;  Laterality: N/A;   LUMBAR DISC SURGERY  1980's X2;  2000's   MAZE N/A 08/22/2015   Procedure: MAZE;  Surgeon: Maude Fleeta Ochoa, MD;  Location: [facility] OR;  Service: Open Heart Surgery;  Laterality: N/A;   PACEMAKER INSERTION  03/31/2012   STJ Accent DR pacemaker implanted by Dr Kelsie   PERMANENT PACEMAKER INSERTION N/A 03/31/2012   Procedure: PERMANENT PACEMAKER INSERTION;  Surgeon: Lynwood Kelsie, MD;  Location: [facility] CATH LAB;  Service: Cardiovascular;  Laterality: N/A;   PPM GENERATOR CHANGEOUT N/A 08/08/2020   Procedure: PPM GENERATOR CHANGEOUT;  Surgeon: Kelsie Lynwood, MD;  Location: [facility] INVASIVE CV LAB;  Service: Cardiovascular;  Laterality: N/A;   RADIOLOGY WITH ANESTHESIA Left 02/14/2021   Procedure: RADIOLOGY WITH ANESTHESIA LEFT RENAL CRYOABLATION;  Surgeon: Luverne Aran, MD;  Location: [facility] ORS;  Service: Radiology;  Laterality: Left;   STUMP REVISION Right 10/09/2022   Procedure: REVISION RIGHT BELOW KNEE AMPUTATION;  Surgeon: Harden Jerona GAILS, MD;  Location: [facility] OR;  Service: Orthopedics;  Laterality: Right;   STUMP REVISION Right 11/16/2022   Procedure: REVISION RIGHT BELOW KNEE AMPUTATION;  Surgeon: Harden Jerona GAILS, MD;  Location: [facility] OR;  Service: Orthopedics;  Laterality: Right;  TEE WITHOUT CARDIOVERSION  01/30/2012   Procedure: TRANSESOPHAGEAL ECHOCARDIOGRAM (TEE);  Surgeon: Ezra GORMAN Shuck, MD;  Location: [facility] ENDOSCOPY;  Service: Cardiovascular;  Laterality: N/A;   ablation next day   TEE WITHOUT CARDIOVERSION N/A 08/22/2015   Procedure: TRANSESOPHAGEAL ECHOCARDIOGRAM (TEE);  Surgeon: Maude Fleeta Ochoa, MD;  Location: [facility] OR;  Service: Open Heart Surgery;  Laterality: N/A;   TEE WITHOUT CARDIOVERSION N/A 10/19/2019   Procedure: TRANSESOPHAGEAL ECHOCARDIOGRAM (TEE);  Surgeon: Cherrie Toribio SAUNDERS, MD;  Location: [facility] ENDOSCOPY;  Service: Cardiovascular;  Laterality: N/A;   US  ECHOCARDIOGRAPHY  08/07/2009   EF 55-60%   Social History   Occupational History   Occupation: retired  Tobacco Use   Smoking status: Former    Current packs/day: 0.00    Average packs/day: 1 pack/day for 30.2 years (30.2 ttl pk-yrs)    Types: Cigarettes    Start date: 1963    Quit date: 07/18/1991    Years since quitting: 32.2    Passive exposure: Never   Smokeless tobacco: Never  Vaping Use   Vaping status: Never Used  Substance and Sexual Activity   Alcohol  use: Not Currently    Comment: occasional   Drug use: Yes    Types: Marijuana    Comment: Marijuana- for nausea   Sexual activity: Not Currently

## 2023-10-21 ENCOUNTER — Other Ambulatory Visit (HOSPITAL_COMMUNITY): Payer: Self-pay | Admitting: *Deleted

## 2023-10-22 ENCOUNTER — Ambulatory Visit (HOSPITAL_COMMUNITY)
Admission: RE | Admit: 2023-10-22 | Discharge: 2023-10-22 | Disposition: A | Discharge status: Home / Self Care | Source: Ambulatory Visit | Attending: Nephrology | Admitting: Nephrology

## 2023-10-22 DIAGNOSIS — D631 Anemia in chronic kidney disease: Secondary | ICD-10-CM | POA: Diagnosis present

## 2023-10-22 DIAGNOSIS — N1832 Chronic kidney disease, stage 3b: Secondary | ICD-10-CM | POA: Insufficient documentation

## 2023-10-22 MED ORDER — SODIUM CHLORIDE 0.9 % IV SOLN
510.0000 mg | INTRAVENOUS | Status: DC
  Administered 2023-10-22: 510 mg via INTRAVENOUS
  Filled 2023-10-22: qty 510

## 2023-10-28 ENCOUNTER — Ambulatory Visit (HOSPITAL_COMMUNITY)
Admission: RE | Admit: 2023-10-28 | Discharge: 2023-10-28 | Disposition: A | Discharge status: Home / Self Care | Source: Ambulatory Visit | Attending: Nephrology | Admitting: Nephrology

## 2023-10-28 ENCOUNTER — Other Ambulatory Visit (HOSPITAL_COMMUNITY): Payer: Self-pay | Admitting: Cardiology

## 2023-10-28 DIAGNOSIS — D509 Iron deficiency anemia, unspecified: Secondary | ICD-10-CM | POA: Diagnosis present

## 2023-10-28 MED ORDER — APIXABAN 5 MG PO TABS: 5.0000 mg | ORAL_TABLET | Freq: Two times a day (BID) | ORAL | 3 refills | Status: DC

## 2023-10-28 MED ORDER — SODIUM CHLORIDE 0.9 % IV SOLN
510.0000 mg | INTRAVENOUS | Status: DC
  Administered 2023-10-28: 510 mg via INTRAVENOUS
  Filled 2023-10-28: qty 510

## 2023-10-28 NOTE — Telephone Encounter (Signed)
 Wife called to request 90 day  rx for eliquis  for mail order pharmacy  Samples provided until shipment is mailed   Medication Samples have been provided to the patient.  Drug name: elquis       Strength: 5 mg        Qty: 28  LOT: [id number]  Exp.Date: 10/2024  Dosing instructions: ONE TAB TWICE A DAY  The patient has been instructed regarding the correct time, dose, and frequency of taking this medication, including desired effects and most common side effects.   [name] M 1:14 PM 10/28/2023

## 2023-10-29 ENCOUNTER — Encounter (HOSPITAL_COMMUNITY)

## 2023-11-06 ENCOUNTER — Ambulatory Visit: Payer: Medicare Other

## 2023-11-06 DIAGNOSIS — I442 Atrioventricular block, complete: Secondary | ICD-10-CM

## 2023-11-07 LAB — CUP PACEART REMOTE DEVICE CHECK
Battery Remaining Longevity: 97 mo
Battery Remaining Percentage: 75 %
Battery Voltage: 3.01 V
Brady Statistic RV Percent Paced: 99 %
Date Time Interrogation Session: 20250710050207
Implantable Lead Connection Status: 753985
Implantable Lead Connection Status: 753985
Implantable Lead Implant Date: 20131203
Implantable Lead Implant Date: 20131203
Implantable Lead Location: 753859
Implantable Lead Location: 753860
Implantable Lead Model: 1948
Implantable Pulse Generator Implant Date: 20220412
Lead Channel Impedance Value: 540 Ohm
Lead Channel Pacing Threshold Amplitude: 1.375 V
Lead Channel Pacing Threshold Pulse Width: 0.5 ms
Lead Channel Sensing Intrinsic Amplitude: 10 mV
Lead Channel Setting Pacing Amplitude: 1.625
Lead Channel Setting Pacing Pulse Width: 0.5 ms
Lead Channel Setting Sensing Sensitivity: 4 mV
Pulse Gen Model: 2272
Pulse Gen Serial Number: 3919039

## 2023-11-10 ENCOUNTER — Ambulatory Visit: Payer: Self-pay | Admitting: Cardiovascular Disease

## 2023-12-10 ENCOUNTER — Encounter: Payer: Self-pay | Admitting: Pulmonary Disease

## 2023-12-10 ENCOUNTER — Ambulatory Visit (INDEPENDENT_AMBULATORY_CARE_PROVIDER_SITE_OTHER): Admitting: Pulmonary Disease

## 2023-12-10 VITALS — BP 131/80 | HR 64 | Ht 71.0 in | Wt 208.0 lb

## 2023-12-10 DIAGNOSIS — J449 Chronic obstructive pulmonary disease, unspecified: Secondary | ICD-10-CM

## 2023-12-10 NOTE — Patient Instructions (Signed)
  VISIT SUMMARY: You had a follow-up visit today to check on your COPD and asthma overlap syndrome. Your symptoms have been stable since your last visit in January 2025, and you have not had any exacerbations or needed prednisone . You are currently using oxygen  at night and sometimes during the day. Your medications include Breztri , Singulair , Albuterol , and Daliresp , which you started in April 2025. Your lung sounds are clear, showing improvement since January.  YOUR PLAN: -COPD-ASTHMA OVERLAP SYNDROME: COPD-asthma overlap syndrome is a condition where symptoms of both chronic obstructive pulmonary disease (COPD) and asthma are present. Your condition is well-managed with your current medications: Breztri , Singulair , Albuterol , and Daliresp . You should continue using oxygen  at night and as needed during the day. Your lung sounds are clear, indicating improvement.  INSTRUCTIONS: Please continue with your current medication regimen and use oxygen  as directed. Schedule a follow-up appointment in six months with either Doctor Byrum or Landry Ferrari.

## 2023-12-10 NOTE — Progress Notes (Signed)
 [name]    [id number]    05-Aug-1950  Primary Care Physician:Larnell Hamilton, MD  Referring Physician: Larnell Hamilton, MD [address] [place],  [place] [address]  Chief complaint: Follow-up visit for severe COPD  HPI: 73 y.o. who  has a past medical history of Aortic stenosis, mild, Arthritis of hand, Asthma, Atrial fibrillation (HCC), CHF (congestive heart failure) (HCC), Controlled type 2 diabetes mellitus without complication, without long-term current use of insulin  (HCC) (12/13/2019), COPD (chronic obstructive pulmonary disease) (HCC), DJD (degenerative joint disease), Fatty liver, Fibromyalgia, GERD (gastroesophageal reflux disease), Hyperlipidemia, Hypertension, Hypothyroidism, Left atrial enlargement, Mitral regurgitation, Obstructive sleep apnea, Pacemaker (03/31/2012), Pneumonia, Restless leg syndrome, Second degree AV block, and Splenomegaly.  Discussed the use of AI scribe software for clinical note transcription with the patient, who gave verbal consent to proceed.  History of Present Illness [name] is a 73 year old male with COPD and asthma overlap syndrome who presents for a routine follow-up visit. He is accompanied by his granddaughter, who is 76 years old.  Respiratory symptoms and disease control - Chronic obstructive pulmonary disease (COPD) and asthma overlap syndrome with stable symptoms since last visit with me in January 2025 - No exacerbations since January 2025 - No requirement for prednisone  since last visit - Uses oxygen  at night while sleeping and intermittently during the day  Medication management - Current regimen includes Breztri , Singulair , Albuterol , and Daliresp  - Daliresp  initiated in April 2025 - Wife manages medications; patient is not fully aware of all prescription details   Outpatient Encounter Medications as of 12/10/2023  Medication Sig   acetaminophen  (TYLENOL ) 325 MG tablet Take 2 tablets (650 mg total) by  mouth every 6 (six) hours as needed.   albuterol  (VENTOLIN  HFA) 108 (90 Base) MCG/ACT inhaler Inhale 2 puffs into the lungs every 6 (six) hours as needed for wheezing or shortness of breath.   amLODipine  (NORVASC ) 5 MG tablet Take 0.5 tablets (2.5 mg total) by mouth in the morning.   apixaban  (ELIQUIS ) 5 MG TABS tablet Take 1 tablet (5 mg total) by mouth 2 (two) times daily.   bisoprolol  (ZEBETA ) 5 MG tablet Take 1 tablet (5 mg total) by mouth daily. Please call office to arrange follow up appointment   Budeson-Glycopyrrol-Formoterol  (BREZTRI  AEROSPHERE) 160-9-4.8 MCG/ACT AERO Inhale 2 puffs into the lungs in the morning and at bedtime.   Cholecalciferol  (VITAMIN D ) 50 MCG (2000 UT) tablet Take 2 tablets (4,000 Units total) by mouth daily.   empagliflozin  (JARDIANCE ) 10 MG TABS tablet Take 1 tablet (10 mg total) by mouth daily.   gabapentin  (NEURONTIN ) 300 MG capsule Take 2 capsules (600 mg total) by mouth 3 (three) times daily. for neuropathy   ipratropium (ATROVENT ) 0.03 % nasal spray Place 2 sprays into both nostrils 2 (two) times daily.   ipratropium-albuterol  (DUONEB) 0.5-2.5 (3) MG/3ML SOLN Take 3 mLs by nebulization every 6 (six) hours as needed.   levothyroxine  (SYNTHROID , LEVOTHROID) 175 MCG tablet Take 175 mcg by mouth daily before breakfast.   Menthol -Camphor (TIGER BALM ARTHRITIS RUB EX) Apply 1 application topically as needed (for leg pain).   montelukast  (SINGULAIR ) 10 MG tablet TAKE 1 TABLET(10 MG) BY MOUTH AT BEDTIME   Multiple Minerals-Vitamins (CAL-MAG-ZINC -D) TABS Take 1 tablet by mouth at bedtime.   Multiple Vitamin (MULTIVITAMIN WITH MINERALS) TABS tablet Take 1 tablet by mouth daily.   ondansetron  (ZOFRAN ) 4 MG tablet Take 1 tablet (4 mg total) by mouth every 6 (  six) hours.   oxyCODONE -acetaminophen  (PERCOCET/ROXICET) 5-325 MG tablet Take 1 tablet by mouth every 6 (six) hours as needed for severe pain (pain score 7-10).   OXYGEN  Inhale 2 L/min into the lungs continuous.    pantoprazole  (PROTONIX ) 40 MG tablet Take 40 mg by mouth daily before breakfast.   Polyethyl Glyc-Propyl Glyc PF (SYSTANE ULTRA PF) 0.4-0.3 % SOLN Place 2 drops into both eyes daily as needed (dry eyes).   polyethylene glycol (MIRALAX  / GLYCOLAX ) 17 g packet Take 17 g by mouth 2 (two) times daily as needed.   potassium chloride  SA (KLOR-CON  M) 20 MEQ tablet Take 20 mEq by mouth 2 (two) times daily.   predniSONE  (DELTASONE ) 2.5 MG tablet Take 1 tablet (2.5 mg total) by mouth daily with breakfast.   roflumilast  (DALIRESP ) 500 MCG TABS tablet Take 1 tablet (500 mcg total) by mouth daily.   rOPINIRole  (REQUIP ) 1 MG tablet Take 1 tablet (1 mg total) by mouth in the morning and at bedtime. (Patient taking differently: Take 1 mg by mouth daily.)   rosuvastatin  (CRESTOR ) 10 MG tablet Take 10 mg by mouth at bedtime.    senna-docusate (SENOKOT-S) 8.6-50 MG tablet Take 1 tablet by mouth 2 (two) times daily.   spironolactone  (ALDACTONE ) 25 MG tablet Take 1 tablet (25 mg total) by mouth daily.   torsemide  (DEMADEX ) 20 MG tablet Take 2 tablets (40 mg total) by mouth daily.   No facility-administered encounter medications on file as of 12/10/2023.     Physical Exam: Today's Vitals   12/10/23 1552  BP: 131/80  Pulse: 64  SpO2: 93%  Weight: 208 lb (94.3 kg)  Height: 5' 11 (1.803 m)   Body mass index is 29.01 kg/m.  Physical Exam GEN: No acute distress. CV: Regular rate and rhythm, no murmurs. LUNGS: Clear to auscultation bilaterally, normal respiratory effort. SKIN JOINTS: Warm and dry, no rash.    Data Reviewed: Imaging: CT abdomen pelvis 04/15/2023-visualized lungs show calcified granuloma lung fields are clear.   Chest x-ray 04/15/2023-no active cardiopulmonary disease. Chest x-ray 06/14/2023-no active cardiopulmonary disease I reviewed the images personally. Assessment & Plan COPD-asthma overlap syndrome COPD-asthma overlap syndrome is well-managed with no exacerbations since starting  Daliresp  in April 2025. No recent use of prednisone . Current regimen of Breztri , Singulair , Albuterol , and Daliresp  is effective. Oxygen  is used at night and as needed during the day. Lung sounds are clear, indicating improvement since January. - Continue current regimen of Breztri , Singulair , Albuterol , and Daliresp . - Use oxygen  at night and as needed during the day. - Follow up in six months with either Doctor Byrum or Landry Ferrari.  Recommendations: Maintain current regimen Follow-up in 6 months  Lonna Coder MD [facility] 12/10/2023, 4:16 PM  CC: Larnell Hamilton, MD

## 2023-12-12 ENCOUNTER — Telehealth (HOSPITAL_COMMUNITY): Payer: Self-pay

## 2023-12-12 NOTE — Progress Notes (Signed)
 ADVANCED HEART FAILURE FOLLOW UP CLINIC NOTE  Referring Physician: Larnell Hamilton, MD  Primary Care: Larnell Hamilton, MD Primary Cardiologist:Dr. Cherrie   HPI: [name] is a 73 y.o. 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 Diuresed with IV lasix  with rapid improvement of symptoms. LHC 02/05/16 with no significant CAD. RHC showed significant difference between PCWP (50) and LVEDP (30) suggestive of MS but no MS on Echo.    Admitted 12/17 for Tikosyn  load. RHC completed during that admission as below.   Admitted 6/21 with recurrent respiratory failure and lactic acidosis felt to be mostly COPD flare. Had recurrent AF and underwent TEE-DC-CV on 10/19/19. Tikosyn  continued.  Admitted 9/21 with SOB/chills, leukocytosis (WBC 15.9k), concerning for COVID infection versus COPD exacerbation. Diuresed with IV lasix  on presentation,    02/25/20 DCCV with Dr. Okey maintained NSR only for 5 hrs.Underwent repeat AF ablation 04/19/20. Returned to AF 04/21/20.   Repeat DC-CV 05/25/20 -> AF  Repeat DC-CV 07/11/19. PPM at [facility]   PPM generator change 4/22.   Admitted 2/23 with CP. Echo showed EF 55-60%, RV normal. Cards consulted and felt CP was atypical and no further work up.   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    SUBJECTIVE: Today he returns for HF follow up.Complaining of pain all over and nausea. Followed at the pain clinic. Chronically short of breath with exertion. Denies PND/Orthopnea. Wears oxygen  3 liters [place]. Appetite ok. No fever or chills. Unable to weigh at home.  Has not had medications this morning.   PMH, current medications, allergies, social history, and family history reviewed in epic.  PHYSICAL EXAM: There were no  vitals filed for this visit.   General:  Appears frail. Arrived in a wheelchair.  HEENT: normal Neck: supple. no JVD. Carotids 2+ bilat; no bruits. No lymphadenopathy or thryomegaly appreciated. Cor: PMI nondisplaced. Regular rate & rhythm. No rubs, gallops or murmurs. Lungs: Coarse with some scattered EW Abdomen: soft Extremities: no cyanosis, clubbing, rash, edema. R BKA  Neuro: alert & oriented x3  ASSESSMENT & PLAN:  1. Chronic diastolic CHF with RV failure - Echo (10/17): EF 60-65%, Grade 3 DD. Mild AI, Mild MR, RV mildly dilated and severely reduced. RA mildly dilated. PA peak pressure 74 mm Hg. Concerns for restrictive cardiomyopathy. - Echo (7/19): EF 60-65% AVR ok  - TEE (6/21): EF 60% RV moderately HK septal flattening RVSP 90s - Echo (3/22): EF 55-60% RV ok. Mild MR/TR. Bioprosthetic AVR ok. Restrictive physiology. - Echo (2/23): EF 55-60%, normal RV, bioprosthetic AVR ok - Echo today EF remains 55-60%. Dr Zenaida reviewed and I provided the results and discussed with him and his wife.   -NYHA III . Volume status appears stable.  - Continue Jardiance  10 mg daily. -Continue torsemide  to 40 mg daily  - Continue spironolactone  25 mg daily.   - Continue bisoprolol  5 mg daily.  2. Persistent Afib s/p AF ablation x 2/Maze s/p pacemaker - TEE-DC-CV on 10/19/19 & DCCV on 02/25/20 - Repeat AF ablation 04/19/20. Returned to AF 04/21/20.  - DC-CV 05/25/20 -> AF - DC-CV 07/11/19. Back in AFL 3/22.  - Off Tikosyn . Per EP, not a candidate for further anti-arrhythmics or  procedures.  - Rate controlled. No bleeding issues.  - Continue Eliquis  5 mg bid.   3. AS s/p AVR - Stable on echo 3/22, AV mean gradient 7 mmHg - aware of need for SBE prophylaxis - Echo (2/23),mean gradient 11.0 mmHg     4 Pulmonary HTN - suspect pulmonary venous hypertension - RHC in 10/17 with pulmonary venous HTN primarily. No change. - TEE (6/21) EF 60% RV moderately HK septal flattening RVSP 90s in setting  of COPD flare and PAF. - Echo (3/22) RV looks better. TR insufficient to measure RVSP - Echo (2/23): EF 55-60%, RV ok, RVSP 32.4 mmHg, AV mean gradient 11.0 mmHg, AV peak gradient 20.8 mmHg, VTI 1.63 cm   5. Mild CAD by cath 01/2016 - No chest pain.   - Continue Crestor  10 mg daily   6. Chronic respiratory failure with hypoxia - Continue with 2L home O2   7. HTN -Stable. Has not had meds today.    8. DM2 - Contiue Jardiance .  - Last Hgb A1c 6.6 (2/23)   9. 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 Prescilla - Check BMET today.   Follow with Dr Cherrie in 3 months.     Harlene HERO [facility] NP-C  12:15 PM  12/12/23

## 2023-12-12 NOTE — Telephone Encounter (Signed)
 Called to confirm/remind patient of their appointment at the [facility] on 12/15/23.   Appointment:   [] Confirmed  [x] Left mess   [] No answer/No voice mail  [] VM Full/unable to leave message  [] Phone not in service  And to bring in all medications and/or complete list.

## 2023-12-15 ENCOUNTER — Encounter (HOSPITAL_COMMUNITY): Payer: Self-pay

## 2023-12-15 ENCOUNTER — Ambulatory Visit (HOSPITAL_COMMUNITY): Payer: Self-pay | Admitting: Family Medicine

## 2023-12-15 ENCOUNTER — Inpatient Hospital Stay (HOSPITAL_COMMUNITY)
Admission: RE | Admit: 2023-12-15 | Discharge: 2023-12-15 | Discharge status: Home / Self Care | Source: Ambulatory Visit | Attending: Family Medicine

## 2023-12-15 VITALS — BP 116/70 | HR 61 | Ht 71.0 in | Wt 208.0 lb

## 2023-12-15 DIAGNOSIS — Z9981 Dependence on supplemental oxygen: Secondary | ICD-10-CM | POA: Insufficient documentation

## 2023-12-15 DIAGNOSIS — J9610 Chronic respiratory failure, unspecified whether with hypoxia or hypercapnia: Secondary | ICD-10-CM | POA: Insufficient documentation

## 2023-12-15 DIAGNOSIS — J9611 Chronic respiratory failure with hypoxia: Secondary | ICD-10-CM

## 2023-12-15 DIAGNOSIS — E1122 Type 2 diabetes mellitus with diabetic chronic kidney disease: Secondary | ICD-10-CM | POA: Diagnosis not present

## 2023-12-15 DIAGNOSIS — Z89511 Acquired absence of right leg below knee: Secondary | ICD-10-CM | POA: Insufficient documentation

## 2023-12-15 DIAGNOSIS — I350 Nonrheumatic aortic (valve) stenosis: Secondary | ICD-10-CM | POA: Diagnosis not present

## 2023-12-15 DIAGNOSIS — Z79899 Other long term (current) drug therapy: Secondary | ICD-10-CM | POA: Diagnosis not present

## 2023-12-15 DIAGNOSIS — Z953 Presence of xenogenic heart valve: Secondary | ICD-10-CM | POA: Diagnosis not present

## 2023-12-15 DIAGNOSIS — I482 Chronic atrial fibrillation, unspecified: Secondary | ICD-10-CM | POA: Diagnosis not present

## 2023-12-15 DIAGNOSIS — Z7984 Long term (current) use of oral hypoglycemic drugs: Secondary | ICD-10-CM | POA: Diagnosis not present

## 2023-12-15 DIAGNOSIS — I5032 Chronic diastolic (congestive) heart failure: Secondary | ICD-10-CM | POA: Insufficient documentation

## 2023-12-15 DIAGNOSIS — J449 Chronic obstructive pulmonary disease, unspecified: Secondary | ICD-10-CM | POA: Diagnosis not present

## 2023-12-15 DIAGNOSIS — I38 Endocarditis, valve unspecified: Secondary | ICD-10-CM

## 2023-12-15 DIAGNOSIS — Z7901 Long term (current) use of anticoagulants: Secondary | ICD-10-CM | POA: Diagnosis not present

## 2023-12-15 DIAGNOSIS — I2510 Atherosclerotic heart disease of native coronary artery without angina pectoris: Secondary | ICD-10-CM | POA: Insufficient documentation

## 2023-12-15 DIAGNOSIS — I272 Pulmonary hypertension, unspecified: Secondary | ICD-10-CM | POA: Insufficient documentation

## 2023-12-15 DIAGNOSIS — I10 Essential (primary) hypertension: Secondary | ICD-10-CM

## 2023-12-15 DIAGNOSIS — I130 Hypertensive heart and chronic kidney disease with heart failure and stage 1 through stage 4 chronic kidney disease, or unspecified chronic kidney disease: Secondary | ICD-10-CM | POA: Insufficient documentation

## 2023-12-15 DIAGNOSIS — N1832 Chronic kidney disease, stage 3b: Secondary | ICD-10-CM | POA: Insufficient documentation

## 2023-12-15 DIAGNOSIS — I081 Rheumatic disorders of both mitral and tricuspid valves: Secondary | ICD-10-CM | POA: Diagnosis not present

## 2023-12-15 LAB — URIC ACID: Uric Acid, Serum: 7.3 mg/dL (ref 3.7–8.6)

## 2023-12-15 LAB — BASIC METABOLIC PANEL WITH GFR
Anion gap: 11 (ref 5–15)
BUN: 19 mg/dL (ref 8–23)
CO2: 32 mmol/L (ref 22–32)
Calcium: 9 mg/dL (ref 8.9–10.3)
Chloride: 96 mmol/L — ABNORMAL LOW (ref 98–111)
Creatinine, Ser: 2.04 mg/dL — ABNORMAL HIGH (ref 0.61–1.24)
GFR, Estimated: 34 mL/min — ABNORMAL LOW (ref >60)
Glucose, Bld: 153 mg/dL — ABNORMAL HIGH (ref 70–99)
Potassium: 4.5 mmol/L (ref 3.5–5.1)
Sodium: 139 mmol/L (ref 135–145)

## 2023-12-15 LAB — BRAIN NATRIURETIC PEPTIDE: B Natriuretic Peptide: 190 pg/mL — ABNORMAL HIGH (ref 0.0–100.0)

## 2023-12-15 MED ORDER — POTASSIUM CHLORIDE CRYS ER 20 MEQ PO TBCR: 20.0000 meq | EXTENDED_RELEASE_TABLET | Freq: Two times a day (BID) | ORAL | 11 refills | Status: DC

## 2023-12-15 MED ORDER — TORSEMIDE 20 MG PO TABS
40.0000 mg | ORAL_TABLET | Freq: Every day | ORAL | 11 refills | Status: DC
Start: 2023-12-15 — End: 2023-12-24

## 2023-12-15 NOTE — Patient Instructions (Addendum)
 Thank you for coming in today  If you had labs drawn today, any labs that are abnormal the clinic will call you No news is good news  Medications: Increase torsemide  to 40 mg daily Increase Potassium 20 meq..2 times a day  Follow up appointments: Your physician recommends that you return for lab work in: 12/22/2023 at 1145am for BMET  Your physician recommends that you schedule a follow-up appointment in:  01/12/2024 11:30 am in clinic   Do the following things EVERYDAY: Weigh yourself in the morning before breakfast. Write it down and keep it in a log. Take your medicines as prescribed Eat low salt foods--Limit salt (sodium) to 2000 mg per day.  Stay as active as you can everyday Limit all fluids for the day to less than 2 liters   At the [facility], you and your health needs are our priority. As part of our continuing mission to provide you with exceptional heart care, we have created designated Provider Care Teams. These Care Teams include your primary Cardiologist (physician) and Advanced Practice Providers (APPs- Physician Assistants and Nurse Practitioners) who all work together to provide you with the care you need, when you need it.   You may see any of the following providers on your designated Care Team at your next follow up: Dr Toribio Fuel Dr Ezra Shuck Dr. Ria Gardenia Greig Lenetta, NP Caffie Shed, [place] [facility] [place], [place] Beckey Coe, NP Tinnie Redman, PharmD   Please be sure to bring in all your medications bottles to every appointment.    Thank you for choosing  [facility]  If you have any questions or concerns before your next appointment please send us  a message through [place] or call our office at [phone].    TO LEAVE A MESSAGE FOR THE NURSE SELECT OPTION 2, PLEASE LEAVE A MESSAGE INCLUDING: YOUR NAME DATE OF BIRTH CALL BACK NUMBER REASON FOR CALL**this is  important as we prioritize the call backs  YOU WILL RECEIVE A CALL BACK THE SAME DAY AS LONG AS YOU CALL BEFORE 4:00 PM

## 2023-12-22 ENCOUNTER — Ambulatory Visit (HOSPITAL_COMMUNITY)
Admission: RE | Admit: 2023-12-22 | Discharge: 2023-12-22 | Disposition: A | Discharge status: Home / Self Care | Source: Ambulatory Visit | Attending: Cardiology

## 2023-12-22 DIAGNOSIS — I5032 Chronic diastolic (congestive) heart failure: Secondary | ICD-10-CM | POA: Diagnosis present

## 2023-12-22 LAB — BASIC METABOLIC PANEL WITH GFR
Anion gap: 9 (ref 5–15)
BUN: 42 mg/dL — ABNORMAL HIGH (ref 8–23)
CO2: 34 mmol/L — ABNORMAL HIGH (ref 22–32)
Calcium: 9.9 mg/dL (ref 8.9–10.3)
Chloride: 93 mmol/L — ABNORMAL LOW (ref 98–111)
Creatinine, Ser: 2.58 mg/dL — ABNORMAL HIGH (ref 0.61–1.24)
GFR, Estimated: 25 mL/min — ABNORMAL LOW (ref >60)
Glucose, Bld: 160 mg/dL — ABNORMAL HIGH (ref 70–99)
Potassium: 4 mmol/L (ref 3.5–5.1)
Sodium: 136 mmol/L (ref 135–145)

## 2023-12-24 ENCOUNTER — Telehealth (HOSPITAL_COMMUNITY): Payer: Self-pay | Admitting: *Deleted

## 2023-12-24 MED ORDER — TORSEMIDE 20 MG PO TABS: ORAL_TABLET | ORAL | Status: DC

## 2023-12-24 NOTE — Telephone Encounter (Signed)
 Called patient's wife per Harlene Gainer, NP with following lab results and instructions:  Renal function worse after increase in diuretics. Decrease torsemide  to 40 mg daily alternating with 20 mg every other day. Repeat BMET in 10-14 days.  Wife states patient changed to torsemide  40 mg alternating with 20 mg on 12/15/23. She says patients' fluid status is much better.  Harlene Gainer, NP updated. Pt will continue 40 mg alternating with 20 mg and get repeat labs at next visit on 01/12/24. Wife verbalized understanding of same.

## 2024-01-02 ENCOUNTER — Other Ambulatory Visit: Payer: Self-pay

## 2024-01-02 ENCOUNTER — Emergency Department (HOSPITAL_COMMUNITY)

## 2024-01-02 ENCOUNTER — Observation Stay (HOSPITAL_COMMUNITY)
Admission: EM | Admit: 2024-01-02 | Discharge: 2024-01-05 | Disposition: A | Discharge status: Home / Self Care | Attending: Internal Medicine | Admitting: Internal Medicine

## 2024-01-02 ENCOUNTER — Encounter (HOSPITAL_COMMUNITY): Payer: Self-pay | Admitting: Internal Medicine

## 2024-01-02 DIAGNOSIS — E1122 Type 2 diabetes mellitus with diabetic chronic kidney disease: Secondary | ICD-10-CM | POA: Insufficient documentation

## 2024-01-02 DIAGNOSIS — M109 Gout, unspecified: Secondary | ICD-10-CM | POA: Insufficient documentation

## 2024-01-02 DIAGNOSIS — E039 Hypothyroidism, unspecified: Secondary | ICD-10-CM | POA: Diagnosis not present

## 2024-01-02 DIAGNOSIS — I130 Hypertensive heart and chronic kidney disease with heart failure and stage 1 through stage 4 chronic kidney disease, or unspecified chronic kidney disease: Secondary | ICD-10-CM | POA: Insufficient documentation

## 2024-01-02 DIAGNOSIS — E785 Hyperlipidemia, unspecified: Secondary | ICD-10-CM | POA: Diagnosis present

## 2024-01-02 DIAGNOSIS — F1290 Cannabis use, unspecified, uncomplicated: Secondary | ICD-10-CM | POA: Diagnosis not present

## 2024-01-02 DIAGNOSIS — R112 Nausea with vomiting, unspecified: Secondary | ICD-10-CM | POA: Insufficient documentation

## 2024-01-02 DIAGNOSIS — C642 Malignant neoplasm of left kidney, except renal pelvis: Secondary | ICD-10-CM | POA: Insufficient documentation

## 2024-01-02 DIAGNOSIS — Z7901 Long term (current) use of anticoagulants: Secondary | ICD-10-CM | POA: Diagnosis not present

## 2024-01-02 DIAGNOSIS — K219 Gastro-esophageal reflux disease without esophagitis: Secondary | ICD-10-CM | POA: Diagnosis not present

## 2024-01-02 DIAGNOSIS — M4802 Spinal stenosis, cervical region: Secondary | ICD-10-CM | POA: Insufficient documentation

## 2024-01-02 DIAGNOSIS — Z7982 Long term (current) use of aspirin: Secondary | ICD-10-CM | POA: Insufficient documentation

## 2024-01-02 DIAGNOSIS — J45909 Unspecified asthma, uncomplicated: Secondary | ICD-10-CM | POA: Diagnosis present

## 2024-01-02 DIAGNOSIS — J449 Chronic obstructive pulmonary disease, unspecified: Secondary | ICD-10-CM | POA: Diagnosis not present

## 2024-01-02 DIAGNOSIS — Z7984 Long term (current) use of oral hypoglycemic drugs: Secondary | ICD-10-CM | POA: Diagnosis not present

## 2024-01-02 DIAGNOSIS — R1084 Generalized abdominal pain: Principal | ICD-10-CM | POA: Insufficient documentation

## 2024-01-02 DIAGNOSIS — R110 Nausea: Secondary | ICD-10-CM | POA: Diagnosis present

## 2024-01-02 DIAGNOSIS — I480 Paroxysmal atrial fibrillation: Secondary | ICD-10-CM | POA: Diagnosis present

## 2024-01-02 DIAGNOSIS — M5412 Radiculopathy, cervical region: Secondary | ICD-10-CM | POA: Insufficient documentation

## 2024-01-02 DIAGNOSIS — I5032 Chronic diastolic (congestive) heart failure: Secondary | ICD-10-CM | POA: Diagnosis not present

## 2024-01-02 DIAGNOSIS — N1832 Chronic kidney disease, stage 3b: Secondary | ICD-10-CM | POA: Diagnosis not present

## 2024-01-02 DIAGNOSIS — I2510 Atherosclerotic heart disease of native coronary artery without angina pectoris: Secondary | ICD-10-CM | POA: Diagnosis present

## 2024-01-02 DIAGNOSIS — Z79899 Other long term (current) drug therapy: Secondary | ICD-10-CM | POA: Diagnosis not present

## 2024-01-02 DIAGNOSIS — R109 Unspecified abdominal pain: Secondary | ICD-10-CM | POA: Diagnosis present

## 2024-01-02 DIAGNOSIS — I10 Essential (primary) hypertension: Secondary | ICD-10-CM | POA: Diagnosis present

## 2024-01-02 DIAGNOSIS — Z87891 Personal history of nicotine dependence: Secondary | ICD-10-CM | POA: Insufficient documentation

## 2024-01-02 DIAGNOSIS — N179 Acute kidney failure, unspecified: Principal | ICD-10-CM

## 2024-01-02 DIAGNOSIS — E119 Type 2 diabetes mellitus without complications: Secondary | ICD-10-CM

## 2024-01-02 LAB — CBC
HCT: 43.5 % (ref 39.0–52.0)
Hemoglobin: 13.3 g/dL (ref 13.0–17.0)
MCH: 28.5 pg (ref 26.0–34.0)
MCHC: 30.6 g/dL (ref 30.0–36.0)
MCV: 93.3 fL (ref 80.0–100.0)
Platelets: 138 K/uL — ABNORMAL LOW (ref 150–400)
RBC: 4.66 MIL/uL (ref 4.22–5.81)
RDW: 19.9 % — ABNORMAL HIGH (ref 11.5–15.5)
WBC: 7.3 K/uL (ref 4.0–10.5)
nRBC: 0 % (ref 0.0–0.2)

## 2024-01-02 LAB — URINALYSIS, ROUTINE W REFLEX MICROSCOPIC
Bilirubin Urine: NEGATIVE
Glucose, UA: 150 mg/dL — AB
Hgb urine dipstick: NEGATIVE
Ketones, ur: 5 mg/dL — AB
Leukocytes,Ua: NEGATIVE
Nitrite: NEGATIVE
Protein, ur: 100 mg/dL — AB
Specific Gravity, Urine: 1.016 (ref 1.005–1.030)
pH: 5 (ref 5.0–8.0)

## 2024-01-02 LAB — COMPREHENSIVE METABOLIC PANEL WITH GFR
ALT: 22 U/L (ref 0–44)
AST: 30 U/L (ref 15–41)
Albumin: 4.5 g/dL (ref 3.5–5.0)
Alkaline Phosphatase: 81 U/L (ref 38–126)
Anion gap: 13 (ref 5–15)
BUN: 23 mg/dL (ref 8–23)
CO2: 28 mmol/L (ref 22–32)
Calcium: 10 mg/dL (ref 8.9–10.3)
Chloride: 95 mmol/L — ABNORMAL LOW (ref 98–111)
Creatinine, Ser: 2.2 mg/dL — ABNORMAL HIGH (ref 0.61–1.24)
GFR, Estimated: 31 mL/min — ABNORMAL LOW (ref >60)
Glucose, Bld: 101 mg/dL — ABNORMAL HIGH (ref 70–99)
Potassium: 4.3 mmol/L (ref 3.5–5.1)
Sodium: 136 mmol/L (ref 135–145)
Total Bilirubin: 0.7 mg/dL (ref 0.0–1.2)
Total Protein: 7.4 g/dL (ref 6.5–8.1)

## 2024-01-02 LAB — I-STAT CHEM 8, ED
BUN: 26 mg/dL — ABNORMAL HIGH (ref 8–23)
Calcium, Ion: 1.16 mmol/L (ref 1.15–1.40)
Chloride: 98 mmol/L (ref 98–111)
Creatinine, Ser: 2.4 mg/dL — ABNORMAL HIGH (ref 0.61–1.24)
Glucose, Bld: 101 mg/dL — ABNORMAL HIGH (ref 70–99)
HCT: 44 % (ref 39.0–52.0)
Hemoglobin: 15 g/dL (ref 13.0–17.0)
Potassium: 4.2 mmol/L (ref 3.5–5.1)
Sodium: 135 mmol/L (ref 135–145)
TCO2: 29 mmol/L (ref 22–32)

## 2024-01-02 LAB — LIPASE, BLOOD: Lipase: 18 U/L (ref 11–51)

## 2024-01-02 LAB — GLUCOSE, CAPILLARY: Glucose-Capillary: 96 mg/dL (ref 70–99)

## 2024-01-02 MED ORDER — SODIUM CHLORIDE 0.9 % IV SOLN: INTRAVENOUS | Status: AC

## 2024-01-02 MED ORDER — PROCHLORPERAZINE EDISYLATE 10 MG/2ML IJ SOLN: 10.0000 mg | Freq: Four times a day (QID) | INTRAMUSCULAR | Status: DC | PRN

## 2024-01-02 MED ORDER — PANTOPRAZOLE SODIUM 40 MG IV SOLR
40.0000 mg | Freq: Once | INTRAVENOUS | Status: AC
  Administered 2024-01-02: 40 mg via INTRAVENOUS
  Filled 2024-01-02: qty 10

## 2024-01-02 MED ORDER — MAGNESIUM OXIDE -MG SUPPLEMENT 400 (240 MG) MG PO TABS
800.0000 mg | ORAL_TABLET | Freq: Once | ORAL | Status: AC
  Administered 2024-01-02: 800 mg via ORAL
  Filled 2024-01-02: qty 2

## 2024-01-02 MED ORDER — PANTOPRAZOLE SODIUM 40 MG PO TBEC
40.0000 mg | DELAYED_RELEASE_TABLET | Freq: Every day | ORAL | Status: DC
  Administered 2024-01-03 – 2024-01-05 (×3): 40 mg via ORAL
  Filled 2024-01-02 (×3): qty 1

## 2024-01-02 MED ORDER — MORPHINE SULFATE (PF) 4 MG/ML IV SOLN
4.0000 mg | Freq: Once | INTRAVENOUS | Status: AC
  Administered 2024-01-02: 4 mg via INTRAVENOUS
  Filled 2024-01-02: qty 1

## 2024-01-02 MED ORDER — METOCLOPRAMIDE HCL 5 MG/ML IJ SOLN
10.0000 mg | Freq: Four times a day (QID) | INTRAMUSCULAR | Status: DC
  Administered 2024-01-02 – 2024-01-04 (×7): 10 mg via INTRAVENOUS
  Filled 2024-01-02 (×7): qty 2

## 2024-01-02 MED ORDER — ONDANSETRON HCL 4 MG/2ML IJ SOLN
4.0000 mg | Freq: Once | INTRAMUSCULAR | Status: AC
  Administered 2024-01-02: 4 mg via INTRAVENOUS
  Filled 2024-01-02: qty 2

## 2024-01-02 MED ORDER — IPRATROPIUM-ALBUTEROL 0.5-2.5 (3) MG/3ML IN SOLN: 3.0000 mL | Freq: Four times a day (QID) | RESPIRATORY_TRACT | Status: DC | PRN

## 2024-01-02 MED ORDER — ACETAMINOPHEN 325 MG PO TABS
650.0000 mg | ORAL_TABLET | Freq: Four times a day (QID) | ORAL | Status: DC | PRN
  Administered 2024-01-04 – 2024-01-05 (×4): 650 mg via ORAL
  Filled 2024-01-02 (×5): qty 2

## 2024-01-02 MED ORDER — SODIUM CHLORIDE 0.9 % IV BOLUS
500.0000 mL | Freq: Once | INTRAVENOUS | Status: AC
  Administered 2024-01-02: 500 mL via INTRAVENOUS

## 2024-01-02 MED ORDER — HYDROMORPHONE HCL 1 MG/ML IJ SOLN
1.0000 mg | INTRAMUSCULAR | Status: DC | PRN
  Administered 2024-01-03: 1 mg via INTRAVENOUS
  Filled 2024-01-02: qty 1

## 2024-01-02 MED ORDER — ACETAMINOPHEN 650 MG RE SUPP: 650.0000 mg | Freq: Four times a day (QID) | RECTAL | Status: DC | PRN

## 2024-01-02 MED ORDER — APIXABAN 5 MG PO TABS
5.0000 mg | ORAL_TABLET | Freq: Two times a day (BID) | ORAL | Status: DC
  Administered 2024-01-02 – 2024-01-05 (×6): 5 mg via ORAL
  Filled 2024-01-02 (×6): qty 1

## 2024-01-02 MED ORDER — INSULIN ASPART 100 UNIT/ML IJ SOLN
0.0000 [IU] | Freq: Three times a day (TID) | INTRAMUSCULAR | Status: DC
  Administered 2024-01-03: 2 [IU] via SUBCUTANEOUS
  Administered 2024-01-04 – 2024-01-05 (×2): 1 [IU] via SUBCUTANEOUS
  Filled 2024-01-02: qty 0.09

## 2024-01-02 MED ORDER — ONDANSETRON HCL 4 MG/2ML IJ SOLN
4.0000 mg | Freq: Four times a day (QID) | INTRAMUSCULAR | Status: DC | PRN
  Administered 2024-01-04: 4 mg via INTRAVENOUS
  Filled 2024-01-02: qty 2

## 2024-01-02 MED ORDER — OXYCODONE HCL 5 MG PO TABS
5.0000 mg | ORAL_TABLET | Freq: Four times a day (QID) | ORAL | Status: AC | PRN
  Administered 2024-01-03 (×3): 5 mg via ORAL
  Filled 2024-01-02 (×3): qty 1

## 2024-01-02 MED ORDER — LEVOTHYROXINE SODIUM 50 MCG PO TABS
175.0000 ug | ORAL_TABLET | Freq: Every day | ORAL | Status: DC
  Administered 2024-01-03 – 2024-01-05 (×3): 175 ug via ORAL
  Filled 2024-01-02 (×3): qty 1

## 2024-01-02 MED ORDER — ONDANSETRON HCL 4 MG PO TABS
4.0000 mg | ORAL_TABLET | Freq: Four times a day (QID) | ORAL | Status: DC | PRN
  Administered 2024-01-04: 4 mg via ORAL
  Filled 2024-01-02: qty 1

## 2024-01-02 NOTE — H&P (Signed)
 History and Physical    Patient: [name] FMW:[id number] DOB: 03-25-1951 DOA: 01/02/2024 DOS: the patient was seen and examined on 01/02/2024 PCP: Larnell Hamilton, MD  Patient coming from: Home  Chief Complaint:  Chief Complaint  Patient presents with   Abdominal Pain   Nausea   Emesis   HPI: [name] is a 73 y.o. male with medical history significant of aortic stenosis, status post AVR, mitral regurgitation, left atrial enlargement, osteoarthritis, asthma, chronic atrial fibrillation, grade 3 diastolic dysfunction, COPD, DJD, fatty liver, fibromyalgia, GERD, hyperlipidemia, hypertension, hypothyroidism, left renal cell carcinoma, OSA not on CPAP, history of second-degree AV block and pacemaker placement, restless leg syndrome, splenomegaly who presented to the emergency department complaints of abdominal pain, nausea and vomiting for the past week.  His appetite is decreased.  His last bowel movement was 6 days ago.  No emesis, diarrhea, melena or hematochezia. He denied fever, chills, rhinorrhea, sore throat, wheezing or hemoptysis.  No chest pain, palpitations, diaphoresis, PND, orthopnea or pitting edema of the lower extremities. No flank pain, dysuria, frequency or hematuria.  No polyuria, polydipsia, polyphagia or blurred vision.   Lab work: Urinalysis showed glucose of 150, ketones of 5 and protein of 100 mg/dL nonischemic.  There was rare bacteria on microscopic examination.  CBC showed a white count 7.3, Hemann 13.3 g/dL platelets 861.  CMP showed a chloride of 95 mmol/L, the rest of the electrolytes were normal.  Glucose 101, BUN 23 and creatinine 2.20 mg/dL.  LFTs were normal.  Imaging: CT abdomen/pelvis without contrast showing mild cholelithiasis.  Severe right renal atrophy.  Small fat-containing left inguinal hernia.  Small fat-containing periumbilical hernia.  Aortic atherosclerosis.   ED course: Initial vital signs were temperature 98 F, pulse 68, respiration 19, BP  125/76 mmHg O2 sat 94% on room air.  The patient received morphine  4 mg IVP, ondansetron  4 mg IVP x 2 and normal saline 500 mL bolus.  Review of Systems: As mentioned in the history of present illness. All other systems reviewed and are negative. Past Medical History:  Diagnosis Date   Aortic stenosis, mild    Arthritis of hand    just a little bit in both hands (03/31/2012)   Asthma    little bit (03/31/2012)   Atrial fibrillation (HCC)    dx '04; DCCV '04, placed on flecainide, failed DCCV 04/2010, flecainide stopped; s/p successful A.fib ablation 01/31/12   CHF (congestive heart failure) (HCC)    Controlled type 2 diabetes mellitus without complication, without long-term current use of insulin  (HCC) 12/13/2019   COPD (chronic obstructive pulmonary disease) (HCC)    DJD (degenerative joint disease)    Fatty liver    Fibromyalgia    GERD (gastroesophageal reflux disease)    Hyperlipidemia    Hypertension    Hypothyroidism    S/P radiation   Left atrial enlargement    LA size 45mm by echo 11/21/11   Mitral regurgitation    trivial   Obstructive sleep apnea    mild by sleep study 2013; pt stated he does not have a machine because it wasn't bad enough for him to have one   Pacemaker 03/31/2012   Pneumonia    last time 2023   Restless leg syndrome    Second degree AV block    Splenomegaly    Past Surgical History:  Procedure Laterality Date   AMPUTATION Right 08/23/2022   Procedure: RIGHT FOOT FIFTH RAY AMPUTATION;  Surgeon: Harden Jerona GAILS, MD;  Location: [facility] OR;  Service: Orthopedics;  Laterality: Right;   AMPUTATION Right 09/04/2022   Procedure: RIGHT BELOW KNEE AMPUTATION;  Surgeon: Harden Jerona GAILS, MD;  Location[facility] OR;  Service: Orthopedics;  Laterality: Right;   AORTIC VALVE REPLACEMENT N/A 08/22/2015   Procedure: AORTIC VALVE REPLACEMENT (AVR);  Surgeon: Maude Fleeta Ochoa, MD;  Location: [facility] OR;  Service: Open Heart Surgery;  Laterality: N/A;   ATRIAL FIBRILLATION ABLATION   01/30/2012   PVI by Dr. Kelsie   ATRIAL FIBRILLATION ABLATION N/A 01/31/2012   Procedure: ATRIAL FIBRILLATION ABLATION;  Surgeon: Lynwood Kelsie, MD;  Location: [facility] CATH LAB;  Service: Cardiovascular;  Laterality: N/A;   ATRIAL FIBRILLATION ABLATION N/A 04/19/2020   Procedure: ATRIAL FIBRILLATION ABLATION;  Surgeon: Kelsie Lynwood, MD;  Location: [facility] INVASIVE CV LAB;  Service: Cardiovascular;  Laterality: N/A;   BACK SURGERY     X 3   BIOPSY  01/01/2022   Procedure: BIOPSY;  Surgeon: Elicia Claw, MD;  Location: [facility] ENDOSCOPY;  Service: Gastroenterology;;   CARDIAC CATHETERIZATION N/A 08/09/2015   Procedure: Right/Left Heart Cath and Coronary Angiography;  Surgeon: Peter M Swaziland, MD;  Location: [facility] INVASIVE CV LAB;  Service: Cardiovascular;  Laterality: N/A;   CARDIAC CATHETERIZATION N/A 02/05/2016   Procedure: Right/Left Heart Cath and Coronary Angiography;  Surgeon: Candyce GORMAN Reek, MD;  Location: [facility] INVASIVE CV LAB;  Service: Cardiovascular;  Laterality: N/A;   CARDIAC CATHETERIZATION N/A 04/17/2016   Procedure: Right Heart Cath;  Surgeon: Toribio JONELLE Fuel, MD;  Location: [facility] INVASIVE CV LAB;  Service: Cardiovascular;  Laterality: N/A;   CARDIOVASCULAR STRESS TEST  03/12/2002   EF 48%, NO EVIDENCE OF ISCHEMIA   CARDIOVERSION  01/2012; 03/31/2012   CARDIOVERSION N/A 02/25/2012   Procedure: CARDIOVERSION;  Surgeon: Lynwood Kelsie, MD;  Location: [facility] CATH LAB;  Service: Cardiovascular;  Laterality: N/A;   CARDIOVERSION N/A 03/31/2012   Procedure: CARDIOVERSION;  Surgeon: Lynwood Kelsie, MD;  Location: [facility] CATH LAB;  Service: Cardiovascular;  Laterality: N/A;   CARDIOVERSION N/A 11/23/2015   Procedure: CARDIOVERSION;  Surgeon: Ezra GORMAN Shuck, MD;  Location: [facility] ENDOSCOPY;  Service: Cardiovascular;  Laterality: N/A;   CARDIOVERSION N/A 04/08/2016   Procedure: CARDIOVERSION;  Surgeon: Toribio JONELLE Fuel, MD;  Location: [facility] ENDOSCOPY;  Service: Cardiovascular;  Laterality: N/A;   CARDIOVERSION N/A  09/14/2018   Procedure: CARDIOVERSION;  Surgeon: Delford Maude BROCKS, MD;  Location: [facility] ENDOSCOPY;  Service: Cardiovascular;  Laterality: N/A;   CARDIOVERSION N/A 10/19/2019   Procedure: CARDIOVERSION;  Surgeon: Fuel Toribio JONELLE, MD;  Location: [facility] ENDOSCOPY;  Service: Cardiovascular;  Laterality: N/A;   CARDIOVERSION N/A 02/25/2020   Procedure: CARDIOVERSION;  Surgeon: Okey Vina GAILS, MD;  Location: [facility] ENDOSCOPY;  Service: Cardiovascular;  Laterality: N/A;   CARDIOVERSION N/A 05/25/2020   Procedure: CARDIOVERSION;  Surgeon: Pietro Redell GORMAN, MD;  Location: [facility] ENDOSCOPY;  Service: Cardiovascular;  Laterality: N/A;   CARDIOVERSION N/A 07/10/2020   Procedure: CARDIOVERSION;  Surgeon: Jeffrie Oneil BROCKS, MD;  Location: [facility] ENDOSCOPY;  Service: Cardiovascular;  Laterality: N/A;   CLIPPING OF ATRIAL APPENDAGE N/A 08/22/2015   Procedure: CLIPPING OF ATRIAL APPENDAGE;  Surgeon: Maude Fleeta Ochoa, MD;  Location: [facility] OR;  Service: Open Heart Surgery;  Laterality: N/A;   COLONOSCOPY WITH PROPOFOL  N/A 01/01/2022   Procedure: COLONOSCOPY WITH PROPOFOL ;  Surgeon: Elicia Claw, MD;  Location: [facility] ENDOSCOPY;  Service: Gastroenterology;  Laterality: N/A;   DOPPLER ECHOCARDIOGRAPHY  03/11/2002   EF 70-75%   FINGER SURGERY Left    Middle finger   FINGER  TENDON REPAIR  1980's   right little finger (03/31/2012)   FLEXIBLE SIGMOIDOSCOPY N/A 12/30/2021   Procedure: FLEXIBLE SIGMOIDOSCOPY;  Surgeon: Dianna Specking, MD;  Location: [facility] ENDOSCOPY;  Service: Gastroenterology;  Laterality: N/A;   INSERT / REPLACE / REMOVE PACEMAKER     St Jude   IR RADIOLOGIST EVAL & MGMT  05/06/2019   IR RADIOLOGIST EVAL & MGMT  10/06/2019   IR RADIOLOGIST EVAL & MGMT  10/31/2020   IR RADIOLOGIST EVAL & MGMT  01/18/2021   IR RADIOLOGIST EVAL & MGMT  03/08/2021   IR RADIOLOGIST EVAL & MGMT  06/13/2021   KIDNEY SURGERY Left    LEFT AND RIGHT HEART CATHETERIZATION WITH CORONARY ANGIOGRAM N/A 10/27/2012   Procedure: LEFT AND RIGHT HEART  CATHETERIZATION WITH CORONARY ANGIOGRAM;  Surgeon: Erick JONELLE Bergamo, MD;  Location: [facility] CATH LAB;  Service: Cardiovascular;  Laterality: N/A;   LUMBAR DISC SURGERY  1980's X2;  2000's   MAZE N/A 08/22/2015   Procedure: MAZE;  Surgeon: Maude Fleeta Ochoa, MD;  Location: [facility] OR;  Service: Open Heart Surgery;  Laterality: N/A;   PACEMAKER INSERTION  03/31/2012   STJ Accent DR pacemaker implanted by Dr Kelsie   PERMANENT PACEMAKER INSERTION N/A 03/31/2012   Procedure: PERMANENT PACEMAKER INSERTION;  Surgeon: Lynwood Kelsie, MD;  Location: [facility] CATH LAB;  Service: Cardiovascular;  Laterality: N/A;   PPM GENERATOR CHANGEOUT N/A 08/08/2020   Procedure: PPM GENERATOR CHANGEOUT;  Surgeon: Kelsie Lynwood, MD;  Location: [facility] INVASIVE CV LAB;  Service: Cardiovascular;  Laterality: N/A;   RADIOLOGY WITH ANESTHESIA Left 02/14/2021   Procedure: RADIOLOGY WITH ANESTHESIA LEFT RENAL CRYOABLATION;  Surgeon: Luverne Aran, MD;  Location: [facility] ORS;  Service: Radiology;  Laterality: Left;   STUMP REVISION Right 10/09/2022   Procedure: REVISION RIGHT BELOW KNEE AMPUTATION;  Surgeon: Harden Jerona GAILS, MD;  Location: [facility] OR;  Service: Orthopedics;  Laterality: Right;   STUMP REVISION Right 11/16/2022   Procedure: REVISION RIGHT BELOW KNEE AMPUTATION;  Surgeon: Harden Jerona GAILS, MD;  Location: [facility] OR;  Service: Orthopedics;  Laterality: Right;   TEE WITHOUT CARDIOVERSION  01/30/2012   Procedure: TRANSESOPHAGEAL ECHOCARDIOGRAM (TEE);  Surgeon: Ezra GORMAN Shuck, MD;  Location: [facility] ENDOSCOPY;  Service: Cardiovascular;  Laterality: N/A;  ablation next day   TEE WITHOUT CARDIOVERSION N/A 08/22/2015   Procedure: TRANSESOPHAGEAL ECHOCARDIOGRAM (TEE);  Surgeon: Maude Fleeta Ochoa, MD;  Location: [facility] OR;  Service: Open Heart Surgery;  Laterality: N/A;   TEE WITHOUT CARDIOVERSION N/A 10/19/2019   Procedure: TRANSESOPHAGEAL ECHOCARDIOGRAM (TEE);  Surgeon: Cherrie Toribio JONELLE, MD;  Location: [facility] ENDOSCOPY;  Service: Cardiovascular;  Laterality: N/A;   US   ECHOCARDIOGRAPHY  08/07/2009   EF 55-60%   Social History:  reports that he quit smoking about 32 years ago. His smoking use included cigarettes. He started smoking about 62 years ago. He has a 30.2 pack-year smoking history. He has never been exposed to tobacco smoke. He has never used smokeless tobacco. He reports that he does not currently use alcohol . He reports current drug use. Drug: Marijuana.  Allergies  Allergen Reactions   Cephalexin     Meloxicam Rash and Other (See Comments)    Red Man Syndrome, also   Vancomycin  Other (See Comments)    Red Man's syndrome 09/02/15, resolved with diphenhydramine  and slowing of rate    Family History  Problem Relation Age of Onset   Heart failure Mother    Stroke Father    Neuropathy Neg Hx     Prior to Admission medications  Medication Sig Start Date End Date Taking? Authorizing Provider  acetaminophen  (TYLENOL ) 325 MG tablet Take 2 tablets (650 mg total) by mouth every 6 (six) hours as needed. 12/24/22   Nicholas Bar, MD  albuterol  (VENTOLIN  HFA) 108 (90 Base) MCG/ACT inhaler Inhale 2 puffs into the lungs every 6 (six) hours as needed for wheezing or shortness of breath. Patient not taking: Reported on 12/15/2023 05/07/23   Mannam, Praveen, MD  allopurinol (ZYLOPRIM) 300 MG tablet 1 tablet Orally Once a day    [provider]  apixaban  (ELIQUIS ) 5 MG TABS tablet Take 1 tablet (5 mg total) by mouth 2 (two) times daily. 10/28/23   Bensimhon, Toribio SAUNDERS, MD  bisoprolol  (ZEBETA ) 5 MG tablet Take 1 tablet (5 mg total) by mouth daily. Please call office to arrange follow up appointment 11/29/22   Bensimhon, Toribio SAUNDERS, MD  Budeson-Glycopyrrol-Formoterol  (BREZTRI  AEROSPHERE) 160-9-4.8 MCG/ACT AERO Inhale 2 puffs into the lungs in the morning and at bedtime. 05/07/23   Mannam, Praveen, MD  Cholecalciferol  (VITAMIN D ) 50 MCG (2000 UT) tablet Take 2 tablets (4,000 Units total) by mouth daily. 12/24/22   Nicholas Bar, MD  colchicine 0.6 MG tablet Take 0.6  mg by mouth daily. 10/14/23   [provider]  doxycycline  (VIBRA -TABS) 100 MG tablet Take 100 mg by mouth 2 (two) times daily. 12/12/23   [provider]  empagliflozin  (JARDIANCE ) 10 MG TABS tablet Take 1 tablet (10 mg total) by mouth daily. 06/26/23   Bensimhon, Toribio SAUNDERS, MD  famotidine  (PEPCID ) 40 MG tablet Take 40 mg by mouth as needed. 12/05/23   [provider]  gabapentin  (NEURONTIN ) 300 MG capsule Take 2 capsules (600 mg total) by mouth 3 (three) times daily. for neuropathy Patient taking differently: Take 600 mg by mouth 2 (two) times daily. for neuropathy 10/19/22   Rai, Ripudeep K, MD  ipratropium (ATROVENT ) 0.03 % nasal spray Place 2 sprays into both nostrils 2 (two) times daily. 09/26/23   Hope Almarie ORN, NP  ipratropium-albuterol  (DUONEB) 0.5-2.5 (3) MG/3ML SOLN Take 3 mLs by nebulization every 6 (six) hours as needed. 05/07/23 05/06/24  Mannam, Praveen, MD  levothyroxine  (SYNTHROID , LEVOTHROID) 175 MCG tablet Take 175 mcg by mouth daily before breakfast.    [provider]  Menthol -Camphor (TIGER BALM ARTHRITIS RUB EX) Apply 1 application topically as needed (for leg pain).    [provider]  montelukast  (SINGULAIR ) 10 MG tablet TAKE 1 TABLET(10 MG) BY MOUTH AT BEDTIME 10/16/23   Shelah Lamar RAMAN, MD  Multiple Minerals-Vitamins (CAL-MAG-ZINC -D) TABS Take 1 tablet by mouth at bedtime.    [provider]  Multiple Vitamin (MULTIVITAMIN WITH MINERALS) TABS tablet Take 1 tablet by mouth daily. 12/15/19   Sheikh, Omair Latif, DO  mupirocin  ointment (BACTROBAN ) 2 % Apply topically daily. 12/12/23   [provider]  ondansetron  (ZOFRAN ) 4 MG tablet Take 1 tablet (4 mg total) by mouth every 6 (six) hours. 01/31/23   Kingsley, Victoria K, DO  oxyCODONE -acetaminophen  (PERCOCET/ROXICET) 5-325 MG tablet Take 1 tablet by mouth every 6 (six) hours as needed for severe pain (pain score 7-10). Patient taking differently: Take 3 tablets by mouth  every 6 (six) hours as needed for severe pain (pain score 7-10). 06/14/23   Zackowski, Scott, MD  OXYGEN  Inhale 2 L/min into the lungs continuous.    [provider]  pantoprazole  (PROTONIX ) 40 MG tablet Take 40 mg by mouth daily before breakfast.    [provider]  Polyethyl Glyc-Propyl Glyc PF (  SYSTANE ULTRA PF) 0.4-0.3 % SOLN Place 2 drops into both eyes daily as needed (dry eyes).    [provider]  polyethylene glycol (MIRALAX  / GLYCOLAX ) 17 g packet Take 17 g by mouth 2 (two) times daily as needed. 12/19/22   Freddi Hamilton, MD  potassium chloride  SA (KLOR-CON  M) 20 MEQ tablet Take 1 tablet (20 mEq total) by mouth 2 (two) times daily. 12/15/23   Glena Harlene HERO, FNP  predniSONE  (DELTASONE ) 2.5 MG tablet Take 1 tablet (2.5 mg total) by mouth daily with breakfast. 05/07/23   Mannam, Praveen, MD  roflumilast  (DALIRESP ) 500 MCG TABS tablet Take 1 tablet (500 mcg total) by mouth daily. 09/26/23   Hope Almarie ORN, NP  rOPINIRole  (REQUIP ) 1 MG tablet Take 1 tablet (1 mg total) by mouth in the morning and at bedtime. Patient taking differently: Take 1 mg by mouth every morning. 01/23/20   Cheryle Page, MD  rosuvastatin  (CRESTOR ) 10 MG tablet Take 10 mg by mouth at bedtime.  08/08/16   [provider]  senna-docusate (SENOKOT-S) 8.6-50 MG tablet Take 1 tablet by mouth 2 (two) times daily. Patient taking differently: Take 2 tablets by mouth 2 (two) times daily.    [provider]  spironolactone  (ALDACTONE ) 25 MG tablet Take 1 tablet (25 mg total) by mouth daily. 02/16/16   Lesia Ozell Barter, PA-C  torsemide  (DEMADEX ) 20 MG tablet Take 40 mg alternating with 20 mg daily. 12/24/23   Milford, Harlene HERO, FNP  traZODone  (DESYREL ) 50 MG tablet 1 tablet orally once a night; Duration: 90 days    [provider]    Physical Exam: Vitals:   01/02/24 1146 01/02/24 1458 01/02/24 1500  BP: 125/76 111/61   Pulse: 68 60   Resp: 19 18   Temp: 98 F  (36.7 C)  97.9 F (36.6 C)  TempSrc: Oral Oral Oral  SpO2: 94% 97%    Physical Exam Vitals and nursing note reviewed.  Constitutional:      General: He is awake. He is not in acute distress.    Appearance: He is obese. He is ill-appearing.  HENT:     Head: Normocephalic.     Nose: No rhinorrhea.     Mouth/Throat:     Mouth: Mucous membranes are dry.  Eyes:     General: No scleral icterus.    Pupils: Pupils are equal, round, and reactive to light.  Neck:     Vascular: No JVD.  Cardiovascular:     Rate and Rhythm: Normal rate and regular rhythm.     Heart sounds: S1 normal and S2 normal.  Pulmonary:     Effort: Pulmonary effort is normal.     Breath sounds: No wheezing, rhonchi or rales.  Abdominal:     General: Abdomen is protuberant. Bowel sounds are normal. There is no distension.     Palpations: Abdomen is soft.     Tenderness: There is abdominal tenderness. There is no right CVA tenderness or left CVA tenderness.     Hernia: A hernia is present. Hernia is present in the umbilical area.  Musculoskeletal:     Cervical back: Neck supple.     Right lower leg: No edema.     Left lower leg: No edema.  Skin:    General: Skin is warm and dry.  Neurological:     General: No focal deficit present.     Mental Status: He is alert and oriented to person, place, and time.  Psychiatric:  Mood and Affect: Mood normal.        Behavior: Behavior normal. Behavior is cooperative.     Data Reviewed:  Results are pending, will review when available. 06/12/2023 transthoracic echocardiogram. IMPRESSIONS:   1. Left ventricular ejection fraction, by estimation, is 60 to 65%. The  left ventricle has normal function. The left ventricle has no regional  wall motion abnormalities. Left ventricular diastolic parameters are  consistent with Grade III diastolic  dysfunction (restrictive). There is the interventricular septum is  flattened in systole, consistent with right  ventricular pressure overload.   2. PA acceleration time consistent with pulmonary hypertension.. Right  ventricular systolic function is moderately reduced. The right ventricular  size is mildly enlarged. 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 The estimated right   ventricular systolic pressure is 78.0 mmHg.   3. Eccentric, posteriorly directed MR jet, at least moderate. Moderate  amount of splay, TEE may be indicated to better quantify. Systolic  blunting of the pulmonary veins. The mitral valve is normal in structure.  Moderate mitral valve regurgitation. No  evidence of mitral stenosis.   4. Tricuspid valve regurgitation is moderate.   5. The aortic valve has been repaired/replaced. Aortic valve  regurgitation is not visualized. No aortic stenosis is present. There is a  bioprosthetic valve present in the aortic position. Procedure Date: 2017.   6. Aortic dilatation noted. There is moderate dilatation of the ascending  aorta, measuring 43 mm.   7. The inferior vena cava is dilated in size with <50% respiratory  variability, suggesting right atrial pressure of 15 mmHg.    Assessment and Plan: Principal Problem:   Intractable abdominal pain Associated with:   Intractable nausea  Superimposed on:   GERD without esophagitis Observation/telemetry. Continue IV fluids. Full liquid diet. Analgesics as needed. Antiemetics as needed. Pantoprazole  40 mg IVP x 1. - Then pantoprazole  40 mg p.o. daily starting tomorrow. Treat constipation. Follow CBC, CMP in AM.  Active Problems:   Hypothyroidism Continue levothyroxine  175 mcg p.o. daily.    DM2 (diabetes mellitus, type 2) (HCC) Carbohydrate modified diet. CBG monitoring with RI SS. Check hemoglobin A1c.    Chronic kidney disease, stage 3b (HCC) Monitor renal function electrolytes.    PAF (paroxysmal atrial fibrillation) (HCC) CHA?DS?-VASc Score of at least 5. Continue apixaban  5 mg p.o. twice  daily. Continue bisoprolol  5 mg p.o. daily.    Chronic diastolic CHF (congestive heart failure) (HCC) Seems to be volume depleted. Continue careful/time-limited IV hydration.    CAD (coronary artery disease) On apixaban , beta-blocker and statin. Will resume after med reconciliation.    Essential hypertension Continue bisoprolol  5 mg p.o. daily. Hold the spironolactone  and torsemide  for now.    Intrinsic asthma Bronchodilators as needed. Supplemental oxygen  as needed.    Dyslipidemia Continue statin after med reconciliation done.     Advance Care Planning:   Code Status: Limited: Do not attempt resuscitation (DNR) -DNR-LIMITED -Do Not Intubate/DNI    Consults:   Family Communication:   Severity of Illness: The appropriate patient status for this patient is OBSERVATION. Observation status is judged to be reasonable and necessary in order to provide the required intensity of service to ensure the patient's safety. The patient's presenting symptoms, physical exam findings, and initial radiographic and laboratory data in the context of their medical condition is felt to place them at decreased risk for further clinical deterioration. Furthermore, it is anticipated that the patient will be medically stable for discharge from the hospital  within 2 midnights of admission.   Author: Alm Dorn Castor, MD 01/02/2024 4:48 PM  For on call review [email or web address].   This document was prepared using Dragon voice recognition software and may contain some unintended transcription errors.

## 2024-01-02 NOTE — ED Triage Notes (Signed)
 Patient in today reporting abd pain, n/v x1 week. Multiple nausea meds with no relief.

## 2024-01-02 NOTE — ED Provider Notes (Addendum)
 [place] EMERGENCY DEPARTMENT AT [facility] Provider Note   CSN: [id number] Arrival date & time: 01/02/24  1136     Patient presents with: Abdominal Pain, Nausea, and Emesis   [name] is a 73 y.o. male with PMHx PAF, HTN, GERD, CHF, CAD, COPD, hypothyroidism, DM, CKD, HLD who presents to ED concerned for nausea x1 week. RLQ abdominal pain started yesterday.  Pain does not radiate into testicles.  Patient tried Zofran  at home without relief and nausea.  Last BM 6 days ago - patient stating that he has not had any food on his stomach to warrant a BM.  Denies hematochezia or black/tarry stools. Endorses dysuria x1 week. Endorses possible mild subjective fever. Endorses congestion. 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     Abdominal Pain Associated symptoms: vomiting   Emesis Associated symptoms: abdominal pain        Prior to Admission medications   Medication Sig Start Date End Date Taking? Authorizing Provider  acetaminophen  (TYLENOL ) 325 MG tablet Take 2 tablets (650 mg total) by mouth every 6 (six) hours as needed. 12/24/22   Nicholas Bar, MD  albuterol  (VENTOLIN  HFA) 108 (90 Base) MCG/ACT inhaler Inhale 2 puffs into the lungs every 6 (six) hours as needed for wheezing or shortness of breath. Patient not taking: Reported on 12/15/2023 05/07/23   Mannam, Praveen, MD  allopurinol (ZYLOPRIM) 300 MG tablet 1 tablet Orally Once a day    [provider]  apixaban  (ELIQUIS ) 5 MG TABS tablet Take 1 tablet (5 mg total) by mouth 2 (two) times daily. 10/28/23   Bensimhon, Toribio SAUNDERS, MD  bisoprolol  (ZEBETA ) 5 MG tablet Take 1 tablet (5 mg total) by mouth daily. Please call office to arrange follow up appointment 11/29/22   Bensimhon, Toribio SAUNDERS, MD  Budeson-Glycopyrrol-Formoterol  (BREZTRI  AEROSPHERE) 160-9-4.8 MCG/ACT AERO Inhale 2 puffs into the lungs in the morning and at bedtime. 05/07/23   Mannam, Praveen, MD  Cholecalciferol  (VITAMIN D ) 50  MCG (2000 UT) tablet Take 2 tablets (4,000 Units total) by mouth daily. 12/24/22   Nicholas Bar, MD  colchicine 0.6 MG tablet Take 0.6 mg by mouth daily. 10/14/23   [provider]  doxycycline  (VIBRA -TABS) 100 MG tablet Take 100 mg by mouth 2 (two) times daily. 12/12/23   [provider]  empagliflozin  (JARDIANCE ) 10 MG TABS tablet Take 1 tablet (10 mg total) by mouth daily. 06/26/23   Bensimhon, Toribio SAUNDERS, MD  famotidine  (PEPCID ) 40 MG tablet Take 40 mg by mouth as needed. 12/05/23   [provider]  gabapentin  (NEURONTIN ) 300 MG capsule Take 2 capsules (600 mg total) by mouth 3 (three) times daily. for neuropathy Patient taking differently: Take 600 mg by mouth 2 (two) times daily. for neuropathy 10/19/22   Rai, Ripudeep K, MD  ipratropium (ATROVENT ) 0.03 % nasal spray Place 2 sprays into both nostrils 2 (two) times daily. 09/26/23   Hope Almarie ORN, NP  ipratropium-albuterol  (DUONEB) 0.5-2.5 (3) MG/3ML SOLN Take 3 mLs by nebulization every 6 (six) hours as needed. 05/07/23 05/06/24  Mannam, Praveen, MD  levothyroxine  (SYNTHROID , LEVOTHROID) 175 MCG tablet Take 175 mcg by mouth daily before breakfast.    [provider]  Menthol -Camphor (TIGER BALM ARTHRITIS RUB EX) Apply 1 application topically as needed (for leg pain).    [provider]  montelukast  (SINGULAIR ) 10 MG tablet TAKE 1 TABLET(10 MG) BY MOUTH AT BEDTIME 10/16/23   Byrum, Lamar RAMAN, MD  Multiple Minerals-Vitamins (CAL-MAG-ZINC -D) TABS Take 1 tablet by mouth at bedtime.    [provider]  Multiple Vitamin (MULTIVITAMIN WITH MINERALS) TABS tablet Take 1 tablet by mouth daily. 12/15/19   Sheikh, Omair Latif, DO  mupirocin  ointment (BACTROBAN ) 2 % Apply topically daily. 12/12/23   [provider]  ondansetron  (ZOFRAN ) 4 MG tablet Take 1 tablet (4 mg total) by mouth every 6 (six) hours. 01/31/23   Kingsley, Victoria K, DO  oxyCODONE -acetaminophen  (PERCOCET/ROXICET) 5-325 MG tablet Take 1  tablet by mouth every 6 (six) hours as needed for severe pain (pain score 7-10). Patient taking differently: Take 3 tablets by mouth every 6 (six) hours as needed for severe pain (pain score 7-10). 06/14/23   Zackowski, Scott, MD  OXYGEN  Inhale 2 L/min into the lungs continuous.    [provider]  pantoprazole  (PROTONIX ) 40 MG tablet Take 40 mg by mouth daily before breakfast.    [provider]  Polyethyl Glyc-Propyl Glyc PF (SYSTANE ULTRA PF) 0.4-0.3 % SOLN Place 2 drops into both eyes daily as needed (dry eyes).    [provider]  polyethylene glycol (MIRALAX  / GLYCOLAX ) 17 g packet Take 17 g by mouth 2 (two) times daily as needed. 12/19/22   Freddi Hamilton, MD  potassium chloride  SA (KLOR-CON  M) 20 MEQ tablet Take 1 tablet (20 mEq total) by mouth 2 (two) times daily. 12/15/23   Glena Harlene HERO, FNP  predniSONE  (DELTASONE ) 2.5 MG tablet Take 1 tablet (2.5 mg total) by mouth daily with breakfast. 05/07/23   Mannam, Praveen, MD  roflumilast  (DALIRESP ) 500 MCG TABS tablet Take 1 tablet (500 mcg total) by mouth daily. 09/26/23   Hope Almarie ORN, NP  rOPINIRole  (REQUIP ) 1 MG tablet Take 1 tablet (1 mg total) by mouth in the morning and at bedtime. Patient taking differently: Take 1 mg by mouth every morning. 01/23/20   Cheryle Page, MD  rosuvastatin  (CRESTOR ) 10 MG tablet Take 10 mg by mouth at bedtime.  08/08/16   [provider]  senna-docusate (SENOKOT-S) 8.6-50 MG tablet Take 1 tablet by mouth 2 (two) times daily. Patient taking differently: Take 2 tablets by mouth 2 (two) times daily.    [provider]  spironolactone  (ALDACTONE ) 25 MG tablet Take 1 tablet (25 mg total) by mouth daily. 02/16/16   Lesia Ozell Barter, PA-C  torsemide  (DEMADEX ) 20 MG tablet Take 40 mg alternating with 20 mg daily. 12/24/23   Milford, Harlene HERO, FNP  traZODone  (DESYREL ) 50 MG tablet 1 tablet orally once a night; Duration: 90 days    [provider]     Allergies: Cephalexin , Meloxicam, and Vancomycin     Review of Systems  Gastrointestinal:  Positive for abdominal pain and vomiting.    Updated Vital Signs BP 125/76 (BP Location: Right Arm)   Pulse 68   Temp 98 F (36.7 C) (Oral)   Resp 19   SpO2 94%   Physical Exam Vitals and nursing note reviewed.  Constitutional:      General: He is not in acute distress.    Appearance: He is not ill-appearing or toxic-appearing.  HENT:     Head: Normocephalic and atraumatic.     Mouth/Throat:     Mouth: Mucous membranes are moist.     Pharynx: No posterior oropharyngeal erythema.  Eyes:     General: No scleral icterus.       Right eye: No discharge.        Left eye: No discharge.  Conjunctiva/sclera: Conjunctivae normal.  Cardiovascular:     Rate and Rhythm: Normal rate and regular rhythm.     Pulses: Normal pulses.     Heart sounds: Normal heart sounds. No murmur heard. Pulmonary:     Effort: Pulmonary effort is normal. No respiratory distress.     Breath sounds: No wheezing, rhonchi or rales.  Abdominal:     General: Abdomen is flat. Bowel sounds are normal. There is no distension.     Palpations: Abdomen is soft. There is no mass.     Tenderness: There is abdominal tenderness in the right lower quadrant.  Musculoskeletal:     Right lower leg: No edema.     Left lower leg: No edema.  Skin:    General: Skin is warm and dry.     Findings: No rash.     Comments: Right BKA  Neurological:     General: No focal deficit present.     Mental Status: He is alert and oriented to person, place, and time. Mental status is at baseline.  Psychiatric:        Mood and Affect: Mood normal.        Behavior: Behavior normal.     (all labs ordered are listed, but only abnormal results are displayed) Labs Reviewed  LIPASE, BLOOD  COMPREHENSIVE METABOLIC PANEL WITH GFR  CBC  URINALYSIS, ROUTINE W REFLEX MICROSCOPIC    EKG: None  Radiology: No results found.   .Critical  Care  Performed by: Hoy Nidia FALCON, PA-C Authorized by: Hoy Nidia FALCON, PA-C   Critical care provider statement:    Critical care time (minutes):  30   Critical care was necessary to treat or prevent imminent or life-threatening deterioration of the following conditions: AKI.   Critical care was time spent personally by me on the following activities:  Development of treatment plan with patient or surrogate, discussions with consultants, evaluation of patient's response to treatment, examination of patient, ordering and review of laboratory studies, ordering and review of radiographic studies, ordering and performing treatments and interventions, pulse oximetry, re-evaluation of patient's condition and review of old charts    Medications Ordered in the ED - No data to display                                  Medical Decision Making Amount and/or Complexity of Data Reviewed Labs: ordered. Radiology: ordered.  Risk Prescription drug management.    This patient presents to the ED for concern of abdominal pain, this involves an extensive number of treatment options, and is a complaint that carries with it a high risk of complications and morbidity.  The differential diagnosis includes gastroenteritis, colitis, small bowel obstruction, appendicitis, cholecystitis, pancreatitis, nephrolithiasis, UTI, pyelonephritis   Co morbidities that complicate the patient evaluation  PAF, HTN, GERD, CHF, CAD, COPD, hypothyroidism, DM, CKD, HLD   Problem List / ED Course / Critical interventions / Medication management  Patient presented for nausea and RLQ abdominal pain.  Patient also concern for dysuria over the past week.  Physical exam with RLQ tenderness to palpation.  Rest of physical exam reassuring.  Patient afebrile with stable vitals. I Ordered, and personally interpreted labs.  CBC without leukocytosis or anemia.  CMP with low chloride at 95.  Lipase within normal limits.  UA  pending. Of note, it appears per chart review that patient's baseline creatinine is near 1.5.  Upon chart review,  patient's torsemide  was recently increased resulting in AKI with creatinine at 2.5 11 days ago.  Outpatient provider decreasing the torsemide  dosing and planning on repeat kidney function test in the near future. Creatinine is downtrending to 2.2 today, but still elevated from baseline. I ordered imaging studies including CT Abd/Pelvis with contrast: evaluate for structural/surgical etiology of patients' severe abdominal pain.  I independently visualized and interpreted imaging and I agree with the radiologist interpretation of small, fat-containing hernias-no other acute process. Staffed with Dr. Charlyn I have reviewed the patients home medicines and have made adjustments as needed   Social Determinants of Health:  geriatric   3:26 PM Care of MACLEAN FOISTER transferred to PA Amjad  at the end of my shift as the patient will require reassessment once labs/imaging have resulted. Patient presentation, ED course, and plan of care discussed with review of all pertinent labs and imaging. Please see his/her note for further details regarding further ED course and disposition. Plan at time of handoff is reassess patient after UA and PO challenge.  If patient is still feeling nauseated or ill, I would recommend inpatient admission for AKI. this may be altered or completely changed at the discretion of the oncoming team pending results of further workup.       Final diagnoses:  None    ED Discharge Orders     None          Hoy Nidia FALCON, [place] 01/02/24 1527    Charlyn Sora, MD 01/03/24 [phone]

## 2024-01-02 NOTE — ED Provider Notes (Signed)
 Signout received on a 73 year old male.  See previous note for full details. He has abdominal pain and currently pending CT scan. Also has an AKI and has had recent adjustments to his torsemide  dose. Creatinine somewhat improved from recent. Physical Exam  BP 111/61 (BP Location: Right Arm)   Pulse 60   Temp 97.9 F (36.6 C) (Oral)   Resp 18   SpO2 97%     Procedures  Procedures  ED Course / MDM    Medical Decision Making Amount and/or Complexity of Data Reviewed Labs: ordered. Radiology: ordered.  Risk Prescription drug management. Decision regarding hospitalization.   On reevaluation patient states he still feels nauseous and has quite a bit of pain.  Will give additional dose of pain medicine.  We discussed option to go home however he states he does not feel comfortable and he lives alone in [place]. Will discuss with hospitalist for admission.  Hospitalist will evaluate patient for admission.       Hildegard Loge, PA-C 01/02/24 1703    Ula Prentice SAUNDERS, MD 01/12/24 [phone]

## 2024-01-03 DIAGNOSIS — R109 Unspecified abdominal pain: Secondary | ICD-10-CM | POA: Diagnosis not present

## 2024-01-03 LAB — COMPREHENSIVE METABOLIC PANEL WITH GFR
ALT: 19 U/L (ref 0–44)
AST: 28 U/L (ref 15–41)
Albumin: 4.2 g/dL (ref 3.5–5.0)
Alkaline Phosphatase: 76 U/L (ref 38–126)
Anion gap: 15 (ref 5–15)
BUN: 20 mg/dL (ref 8–23)
CO2: 23 mmol/L (ref 22–32)
Calcium: 9.6 mg/dL (ref 8.9–10.3)
Chloride: 98 mmol/L (ref 98–111)
Creatinine, Ser: 1.84 mg/dL — ABNORMAL HIGH (ref 0.61–1.24)
GFR, Estimated: 38 mL/min — ABNORMAL LOW (ref >60)
Glucose, Bld: 105 mg/dL — ABNORMAL HIGH (ref 70–99)
Potassium: 3.9 mmol/L (ref 3.5–5.1)
Sodium: 137 mmol/L (ref 135–145)
Total Bilirubin: 0.7 mg/dL (ref 0.0–1.2)
Total Protein: 7 g/dL (ref 6.5–8.1)

## 2024-01-03 LAB — CBC
HCT: 43.2 % (ref 39.0–52.0)
Hemoglobin: 13.2 g/dL (ref 13.0–17.0)
MCH: 28.6 pg (ref 26.0–34.0)
MCHC: 30.6 g/dL (ref 30.0–36.0)
MCV: 93.7 fL (ref 80.0–100.0)
Platelets: 134 K/uL — ABNORMAL LOW (ref 150–400)
RBC: 4.61 MIL/uL (ref 4.22–5.81)
RDW: 19.5 % — ABNORMAL HIGH (ref 11.5–15.5)
WBC: 8.1 K/uL (ref 4.0–10.5)
nRBC: 0 % (ref 0.0–0.2)

## 2024-01-03 LAB — GLUCOSE, CAPILLARY
Glucose-Capillary: 98 mg/dL (ref 70–99)
Glucose-Capillary: 119 mg/dL — ABNORMAL HIGH (ref 70–99)
Glucose-Capillary: 175 mg/dL — ABNORMAL HIGH (ref 70–99)
Glucose-Capillary: 132 mg/dL — ABNORMAL HIGH (ref 70–99)

## 2024-01-03 LAB — HEMOGLOBIN A1C
Hgb A1c MFr Bld: 6.5 % — ABNORMAL HIGH (ref 4.8–5.6)
Mean Plasma Glucose: 139.85 mg/dL

## 2024-01-03 MED ORDER — BISACODYL 10 MG RE SUPP
10.0000 mg | Freq: Once | RECTAL | Status: AC
  Administered 2024-01-03: 10 mg via RECTAL
  Filled 2024-01-03: qty 1

## 2024-01-03 MED ORDER — SENNOSIDES-DOCUSATE SODIUM 8.6-50 MG PO TABS
1.0000 | ORAL_TABLET | Freq: Two times a day (BID) | ORAL | Status: DC
  Administered 2024-01-03 – 2024-01-05 (×5): 1 via ORAL
  Filled 2024-01-03 (×5): qty 1

## 2024-01-03 MED ORDER — IPRATROPIUM-ALBUTEROL 0.5-2.5 (3) MG/3ML IN SOLN
3.0000 mL | Freq: Two times a day (BID) | RESPIRATORY_TRACT | Status: DC
  Administered 2024-01-04 – 2024-01-05 (×3): 3 mL via RESPIRATORY_TRACT
  Filled 2024-01-03 (×3): qty 3

## 2024-01-03 MED ORDER — IPRATROPIUM-ALBUTEROL 0.5-2.5 (3) MG/3ML IN SOLN
3.0000 mL | Freq: Four times a day (QID) | RESPIRATORY_TRACT | Status: DC
  Administered 2024-01-03: 3 mL via RESPIRATORY_TRACT
  Filled 2024-01-03: qty 3

## 2024-01-03 MED ORDER — ORAL CARE MOUTH RINSE: 15.0000 mL | OROMUCOSAL | Status: DC | PRN

## 2024-01-03 MED ORDER — POLYETHYLENE GLYCOL 3350 17 G PO PACK
17.0000 g | PACK | Freq: Two times a day (BID) | ORAL | Status: DC
  Administered 2024-01-03 – 2024-01-05 (×5): 17 g via ORAL
  Filled 2024-01-03 (×5): qty 1

## 2024-01-03 NOTE — Progress Notes (Signed)
 PROGRESS NOTE  [name]  FMW:[id number] DOB: [birth year] DOA: 01/02/2024 PCP: Larnell Hamilton, MD   Brief Narrative: Patient is a 73 year old male with history of aortic stenosis, status post aortic valve replacement, mitral regurgitation, asthma, chronic A-fib, HFpEF, COPD, hypertension, hyperlipidemia, hypothyroidism, left renal cell carcinoma, OSA not on CPAP, secondary AV block status post pacemaker placement who presented with complaint of abdominal pain, nausea, vomiting, low appetite, lack of bowel movement.  On presentation, lab work showed creatinine of 2.2, normal liver enzymes.  CT abdomen/pelvis showed cholelithiasis without evidence of cholecystitis, severe right renal atrophy, a small fat-containing periumbilical hernia.  Vital signs were stable.  This morning he remains comfortable.  No bowel meant for last 7 days.  Aggressive bowel regimen started  Assessment & Plan:  Principal Problem:   Intractable abdominal pain Active Problems:   Hypothyroidism   DM2 (diabetes mellitus, type 2) (HCC)   Chronic kidney disease, stage 3b (HCC)   PAF (paroxysmal atrial fibrillation) (HCC)   Essential hypertension   Intrinsic asthma   GERD without esophagitis   Chronic diastolic CHF (congestive heart failure) (HCC)   CAD (coronary artery disease)   Dyslipidemia   Intractable nausea   Abdominal pain/nausea/vomiting:   CT abdomen/pelvis did not show any acute findings.  Right upper quadrant ultrasound showed cholelithiasis without evidence of cholecystitis.  Likely from lack of bowel movement No bowel movement for last several days.  Continue aggressive bowel regimen.  Currently on MiraLAX , Senokot, Dulcolax suppository.  Continue PPI. Denies abdomen pain, nausea or vomiting today.  Abdomen is distended but soft and nontender with good bowel sounds  Diabetes type 2: Currently on sliding scale.  Monitor blood sugars.  On Jardiance  at home.  AKI in CKD stage IIIb: Baseline creatinine is  around 1.5-2.  Presented with creatinine of 2.85.  Currently kidney function at baseline.  CT imaging showed severe right renal atrophy.  Follows with [facility]  Persistent A-fib: On Eliquis  and bisoprolol .  Monitor on telemetry.  Currently rate well-controlled, on normal sinus rhythm.  Status post ablation, is status post pacer placement, now on rate control strategy.  Remains in paced rhythm  History of coronary artery disease: On beta-blocker, statin..  No anginal symptoms.  Also has history of aortic stenosis status post atrial valve replacement  Chronic HFpEF: Follows with cardiology.  Echo done on 06/11/2022 showed EF of 60 to 65%, grade 3 diastolic dysfunction, pulmonary hypertension.  He follows with cardiology.  On Jardiance , torsemide , aspirin , bisoprolol  at home  Hypertension: On bisoprolol , aspirin  lactone, torsemide .  Continue the same  History of COPD/asthma/chronic hypoxic respiratory failure: Has mild expiratory wheezing ,continue bronchodilators as scheduled for now.  On 2 L of oxygen  at home chronically.  Follows-up with pulmonology  Hypertension: Currently blood pressure stable.  Continue current medications          DVT prophylaxis: apixaban  (ELIQUIS ) tablet 5 mg     Code Status: Limited: Do not attempt resuscitation (DNR) -DNR-LIMITED -Do Not Intubate/DNI   Family Communication: None at the bedside  Patient status:Obs  Patient is from : Home  Anticipated discharge to: Home  Estimated DC date: 1-2 days   Consultants: None  Procedures: None  Antimicrobials:  Anti-infectives (From admission, onward)    None       Subjective: Patient seen and examined at bedside today.  Hemodynamically stable.  Comfortable this morning.  Denied any abdominal pain, nausea or vomiting today.  No bowel movement for last 7 days.  Abdomen has  good bowel sounds but appears slightly distended but nontender.  Objective: Vitals:   01/03/24 0010 01/03/24 0409 01/03/24  0500 01/03/24 0803  BP: 130/84 132/86  128/83  Pulse: 63 63  65  Resp: 20 20  16   Temp: 97.7 F (36.5 C) 97.7 F (36.5 C)  (!) 97.5 F (36.4 C)  TempSrc: Oral Oral  Oral  SpO2: 97% 93% 96% 100%  Weight:      Height:        Intake/Output Summary (Last 24 hours) at 01/03/2024 0813 Last data filed at 01/03/2024 0453 Gross per 24 hour  Intake 1389.2 ml  Output 550 ml  Net 839.2 ml   Filed Weights   01/02/24 1945  Weight: 87.8 kg    Examination:  General exam: Overall comfortable, not in distress HEENT: PERRL Respiratory system:  no wheezes or crackles  Cardiovascular system: S1 & S2 heard, RRR.  Gastrointestinal system: Abdomen is distended, soft and nontender.  Bowel sounds present Central nervous system: Alert and oriented Extremities: No edema, no clubbing ,no cyanosis Skin: No rashes, no ulcers,no icterus     Data Reviewed: I have personally reviewed following labs and imaging studies  CBC: Recent Labs  Lab 01/02/24 1300 01/02/24 1307 01/03/24 0533  WBC 7.3  --  8.1  HGB 13.3 15.0 13.2  HCT 43.5 44.0 43.2  MCV 93.3  --  93.7  PLT 138*  --  134*   Basic Metabolic Panel: Recent Labs  Lab 01/02/24 1300 01/02/24 1307 01/03/24 0533  NA 136 135 137  K 4.3 4.2 3.9  CL 95* 98 98  CO2 28  --  23  GLUCOSE 101* 101* 105*  BUN 23 26* 20  CREATININE 2.20* 2.40* 1.84*  CALCIUM  10.0  --  9.6     No results found for this or any previous visit (from the past 240 hours).   Radiology Studies: US  Abdomen Limited RUQ (LIVER/GB) Result Date: 01/02/2024 CLINICAL DATA:  Right upper quadrant abdominal pain. EXAM: ULTRASOUND ABDOMEN LIMITED RIGHT UPPER QUADRANT COMPARISON:  None Available. FINDINGS: Gallbladder: A 6.4 mm gallstone is seen within the gallbladder neck. There is no evidence of gallbladder wall thickening (2.0 mm). No sonographic Murphy sign noted by sonographer. Common bile duct: Diameter: 5.2 mm Liver: No focal lesion identified. Within normal limits in  parenchymal echogenicity. Portal vein is patent on color Doppler imaging with normal direction of blood flow towards the liver. Other: None. IMPRESSION: Cholelithiasis without evidence of acute cholecystitis. Electronically Signed   By: Suzen Dials M.D.   On: 01/02/2024 17:21   CT ABDOMEN PELVIS WO CONTRAST Result Date: 01/02/2024 CLINICAL DATA:  Acute right lower quadrant abdominal pain. EXAM: CT ABDOMEN AND PELVIS WITHOUT CONTRAST TECHNIQUE: Multidetector CT imaging of the abdomen and pelvis was performed following the standard protocol without IV contrast. RADIATION DOSE REDUCTION: This exam was performed according to the departmental dose-optimization program which includes automated exposure control, adjustment of the mA and/or kV according to patient size and/or use of iterative reconstruction technique. COMPARISON:  April 15, 2023. FINDINGS: Lower chest: No acute abnormality. Hepatobiliary: Mild cholelithiasis. No biliary dilatation. Liver is unremarkable. Pancreas: Unremarkable. No pancreatic ductal dilatation or surrounding inflammatory changes. Spleen: Normal in size without focal abnormality. Adrenals/Urinary Tract: Adrenal glands appear normal. Severe right renal atrophy is noted. No hydronephrosis or renal obstruction. Urinary bladder is unremarkable. Stomach/Bowel: Stomach is within normal limits. Appendix appears normal. No evidence of bowel wall thickening, distention, or inflammatory changes. Vascular/Lymphatic: Aortic atherosclerosis. No enlarged  abdominal or pelvic lymph nodes. Reproductive: Prostate is unremarkable. Other: Small fat containing left inguinal hernia. No ascites. Small fat containing periumbilical hernia. Musculoskeletal: No acute or significant osseous findings. IMPRESSION: 1. Mild cholelithiasis. 2. Severe right renal atrophy. 3. Small fat containing left inguinal hernia. 4. Small fat containing periumbilical hernia. 5. Aortic atherosclerosis. Aortic Atherosclerosis  (ICD10-I70.0). Electronically Signed   By: Lynwood Landy Raddle M.D.   On: 01/02/2024 14:05    Scheduled Meds:  apixaban   5 mg Oral BID   insulin  aspart  0-9 Units Subcutaneous TID WC   levothyroxine   175 mcg Oral Q0600   metoCLOPramide  (REGLAN ) injection  10 mg Intravenous Q6H   pantoprazole   40 mg Oral QAC breakfast   Continuous Infusions:   LOS: 0 days   Ivonne Mustache, MD [facility] P9/09/2023, 8:13 AM

## 2024-01-03 NOTE — Care Management Obs Status (Signed)
 MEDICARE OBSERVATION STATUS NOTIFICATION   Patient Details  Name: [name] MRN: [id number] Date of Birth: February 20, 1951   Medicare Observation Status Notification Given:  Yes    Joscelyn Hardrick A Caellum Mancil, LCSW 01/03/2024, 1:58 PM

## 2024-01-03 NOTE — Progress Notes (Signed)
   01/03/24 1420  TOC Brief Assessment  Insurance and Status Reviewed  Patient has primary care physician Yes  Home environment has been reviewed single family home  Prior level of function: independent  Prior/Current Home Services Current home services (Home O2)  Social Drivers of Health Review SDOH reviewed no interventions necessary  Readmission risk has been reviewed Yes  Transition of care needs no transition of care needs at this time    CSW met with pt at bedside to discuss discharge planning. Pt reports wearing oxygen  at home only at night. Pt denies need for travel oxygen  tank at discharge. No further TOC needs at this time.  Signed: Heather Saltness, MSW, LCSW Clinical Social Worker Inpatient Care Management 01/03/2024 2:21 PM

## 2024-01-04 DIAGNOSIS — R109 Unspecified abdominal pain: Secondary | ICD-10-CM | POA: Diagnosis not present

## 2024-01-04 LAB — BASIC METABOLIC PANEL WITH GFR
Anion gap: 16 — ABNORMAL HIGH (ref 5–15)
BUN: 19 mg/dL (ref 8–23)
CO2: 22 mmol/L (ref 22–32)
Calcium: 9.7 mg/dL (ref 8.9–10.3)
Chloride: 100 mmol/L (ref 98–111)
Creatinine, Ser: 1.81 mg/dL — ABNORMAL HIGH (ref 0.61–1.24)
GFR, Estimated: 39 mL/min — ABNORMAL LOW (ref >60)
Glucose, Bld: 100 mg/dL — ABNORMAL HIGH (ref 70–99)
Potassium: 3.9 mmol/L (ref 3.5–5.1)
Sodium: 138 mmol/L (ref 135–145)

## 2024-01-04 LAB — GLUCOSE, CAPILLARY
Glucose-Capillary: 119 mg/dL — ABNORMAL HIGH (ref 70–99)
Glucose-Capillary: 127 mg/dL — ABNORMAL HIGH (ref 70–99)
Glucose-Capillary: 102 mg/dL — ABNORMAL HIGH (ref 70–99)
Glucose-Capillary: 158 mg/dL — ABNORMAL HIGH (ref 70–99)

## 2024-01-04 MED ORDER — OXYCODONE HCL 5 MG PO TABS
10.0000 mg | ORAL_TABLET | Freq: Three times a day (TID) | ORAL | Status: DC | PRN
  Administered 2024-01-04 – 2024-01-05 (×3): 10 mg via ORAL
  Filled 2024-01-04 (×3): qty 2

## 2024-01-04 MED ORDER — FLEET ENEMA RE ENEM
1.0000 | ENEMA | Freq: Once | RECTAL | Status: AC
  Administered 2024-01-04: 1 via RECTAL
  Filled 2024-01-04: qty 1

## 2024-01-04 MED ORDER — ENSURE PLUS HIGH PROTEIN PO LIQD
237.0000 mL | Freq: Two times a day (BID) | ORAL | Status: DC
  Administered 2024-01-04: 237 mL via ORAL

## 2024-01-04 MED ORDER — METOCLOPRAMIDE HCL 10 MG PO TABS
10.0000 mg | ORAL_TABLET | Freq: Three times a day (TID) | ORAL | Status: DC
  Administered 2024-01-04 – 2024-01-05 (×3): 10 mg via ORAL
  Filled 2024-01-04 (×3): qty 1

## 2024-01-04 NOTE — Progress Notes (Signed)
 PIV leaking. PIV removed. Okay for pt to not have PIV at this time per Ivonne Mustache, MD.

## 2024-01-04 NOTE — Evaluation (Signed)
 Physical Therapy Evaluation Patient Details Name: [name] MRN: [id number] DOB: 05/28/1950 Today's Date: 01/04/2024  History of Present Illness  73 yo male admitted with abd pain, N/V. 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 He was able to briefly stand from recliner using armrests. Did not have pt perform pivot back to bed-pt requested to remain in recliner and he wasn't feeling well (nausea). Will continue to follow pt during this hospital stay. Pt politely declines HHPT f/u.       If plan is discharge home, recommend the following: A little help with bathing/dressing/bathroom;A little help with walking and/or transfers   Can travel by private vehicle        Equipment Recommendations None recommended by PT  Recommendations for Other Services       Functional Status Assessment Patient has had a recent decline in their functional status and demonstrates the ability to make significant improvements in function in a reasonable and predictable amount of time.     Precautions / Restrictions Precautions Precautions: Fall Precaution/Restrictions Comments: R BKA Restrictions Weight Bearing Restrictions Per Provider Order: No      Mobility  Bed Mobility               General bed mobility comments: oob in recliner    Transfers Overall transfer level: Needs assistance Equipment used: None Transfers: Sit to/from Stand Sit to Stand: Min assist           General transfer comment: Assist to rise from recliner. Increased time. Did not have pt perform pivot back to bed-pt requested to remain in recliner and he wasn't feeling well (nausea)    Ambulation/Gait               General Gait Details: nonambulatory without prosthesis  Stairs            Wheelchair Mobility     Tilt Bed    Modified Rankin (Stroke Patients  Only)       Balance Overall balance assessment: Needs assistance   Sitting balance-Leahy Scale: Good                                       Pertinent Vitals/Pain Pain Assessment Pain Assessment: No/denies pain    Home Living Family/patient expects to be discharged to:: Private residence Living Arrangements: Spouse/significant other   Type of Home: House Home Access: Ramped entrance       Home Layout: One level Home Equipment: Agricultural consultant (2 wheels);Wheelchair - Conservation officer, nature Comments: uses wheelchair primarily; if wearing prosthesis, uses RW ADLs Comments: wife helps with bathing, dressing     Extremity/Trunk Assessment   Upper Extremity Assessment Upper Extremity Assessment: Generalized weakness    Lower Extremity Assessment Lower Extremity Assessment: Generalized weakness    Cervical / Trunk Assessment Cervical / Trunk Assessment: Normal  Communication   Communication Communication: No apparent difficulties    Cognition Arousal: Alert Behavior During Therapy: WFL for tasks assessed/performed   PT - Cognitive impairments: No apparent impairments  Following commands: Intact       Cueing Cueing Techniques: Verbal cues     General Comments      Exercises     Assessment/Plan    PT Assessment Patient needs continued PT services  PT Problem List Decreased strength;Decreased activity tolerance;Decreased balance;Decreased mobility;Decreased knowledge of use of DME       PT Treatment Interventions Functional mobility training;Therapeutic activities;Therapeutic exercise;DME instruction;Balance training;Patient/family education    PT Goals (Current goals can be found in the Care Plan section)  Acute Rehab PT Goals Patient Stated Goal: to feel better PT Goal Formulation: With patient Time For Goal Achievement: 01/18/24 Potential to Achieve  Goals: Good    Frequency Min 3X/week     Co-evaluation               AM-PAC PT 6 Clicks Mobility  Outcome Measure Help needed turning from your back to your side while in a flat bed without using bedrails?: A Little Help needed moving from lying on your back to sitting on the side of a flat bed without using bedrails?: A Little Help needed moving to and from a bed to a chair (including a wheelchair)?: A Little Help needed standing up from a chair using your arms (e.g., wheelchair or bedside chair)?: A Little Help needed to walk in hospital room?: Total Help needed climbing 3-5 steps with a railing? : Total 6 Click Score: 14    End of Session   Activity Tolerance: Patient tolerated treatment well Patient left: in chair;with call bell/phone within reach   PT Visit Diagnosis: Muscle weakness (generalized) (M62.81);Other abnormalities of gait and mobility (R26.89)    Time: 8980-8972 PT Time Calculation (min) (ACUTE ONLY): 8 min   Charges:   PT Evaluation $PT Eval Low Complexity: 1 Low   PT General Charges $$ ACUTE PT VISIT: 1 Visit            Dannial SQUIBB, PT Acute Rehabilitation  Office: [phone]

## 2024-01-04 NOTE — Plan of Care (Signed)
  Problem: Clinical Measurements: Goal: Respiratory complications will improve Outcome: Progressing   Problem: Activity: Goal: Risk for activity intolerance will decrease Outcome: Progressing   Problem: Pain Managment: Goal: General experience of comfort will improve and/or be controlled Outcome: Progressing   Problem: Safety: Goal: Ability to remain free from injury will improve Outcome: Progressing   Problem: Skin Integrity: Goal: Risk for impaired skin integrity will decrease Outcome: Progressing

## 2024-01-04 NOTE — Progress Notes (Signed)
 PROGRESS NOTE  [name]  FMW:[id number] DOB: [birth year] DOA: 01/02/2024 PCP: Larnell Hamilton, MD   Brief Narrative: Patient is a 73 year old male with history of aortic stenosis, status post aortic valve replacement, mitral regurgitation, asthma, chronic A-fib, HFpEF, COPD, hypertension, hyperlipidemia, hypothyroidism, left renal cell carcinoma, OSA not on CPAP, secondary AV block status post pacemaker placement who presented with complaint of abdominal pain, nausea, vomiting, low appetite, lack of bowel movement.  On presentation, lab work showed creatinine of 2.2, normal liver enzymes.  CT abdomen/pelvis showed cholelithiasis without evidence of cholecystitis, severe right renal atrophy, a small fat-containing periumbilical hernia.  Vital signs were stable.   No bowel meant for last 7 days.  Aggressive bowel regimen started  Assessment & Plan:  Principal Problem:   Intractable abdominal pain Active Problems:   Hypothyroidism   DM2 (diabetes mellitus, type 2) (HCC)   Chronic kidney disease, stage 3b (HCC)   PAF (paroxysmal atrial fibrillation) (HCC)   Essential hypertension   Intrinsic asthma   GERD without esophagitis   Chronic diastolic CHF (congestive heart failure) (HCC)   CAD (coronary artery disease)   Dyslipidemia   Intractable nausea   Abdominal pain/nausea/vomiting:   CT abdomen/pelvis did not show any acute findings.  Right upper quadrant ultrasound showed cholelithiasis without evidence of cholecystitis.  Likely from lack of bowel movement No bowel movement for last several days.  Continue aggressive bowel regimen.  Currently on MiraLAX , Senokot.  Had a small bowel movement 2 times yesterday but not significant.  Continue PPI. Complains of some nausea today.  Abdomen is distended but soft and nontender with good bowel sounds.  Will order Fleet enema.  Continue antibiotics.  Currently on Reglan .  Diabetes type 2: Currently on sliding scale.  Monitor blood sugars.  On  Jardiance  at home.  AKI in CKD stage IIIb: Baseline creatinine is around 1.5-2.  Presented with creatinine of 2.85.  Currently kidney function at baseline.  CT imaging showed severe right renal atrophy.  Follows with [facility]  Persistent A-fib: On Eliquis  and bisoprolol .  Monitor on telemetry.  Currently rate well-controlled, on normal sinus rhythm.  Status post ablation, is status post pacer placement, now on rate control strategy.  Remains in paced rhythm  History of coronary artery disease: On beta-blocker, statin..  No anginal symptoms.  Also has history of aortic stenosis status post atrial valve replacement  Chronic HFpEF: Follows with cardiology.  Echo done on 06/11/2022 showed EF of 60 to 65%, grade 3 diastolic dysfunction, pulmonary hypertension.  He follows with cardiology.  On Jardiance , torsemide , aspirin , bisoprolol  at home  Hypertension: On bisoprolol , aspirin  lactone, torsemide .  Continue the same  History of COPD/asthma/chronic hypoxic respiratory failure: Has mild expiratory wheezing ,continue bronchodilators as scheduled for now.  On 2 L of oxygen  at home chronically.  Follows-up with pulmonology  Hypertension: Currently blood pressure stable.  Continue current medications  Deconditioning: Will consult PT        DVT prophylaxis: apixaban  (ELIQUIS ) tablet 5 mg     Code Status: Limited: Do not attempt resuscitation (DNR) -DNR-LIMITED -Do Not Intubate/DNI   Family Communication: None at the bedside  Patient status:Obs  Patient is from : Home  Anticipated discharge to: Home  Estimated DC date: Tomorrow   Consultants: None  Procedures: None  Antimicrobials:  Anti-infectives (From admission, onward)    None       Subjective: Patient seen and examined at bedside today.  Overall comfortable.  Sitting in the chair.  He  complains of some nausea today.  No abdominal pain.  Had very small 2 bowel movements yesterday.  We discussed about giving a Fleet  enema.  No vomiting. Doesnot feel  to go home today  Objective: Vitals:   01/03/24 1418 01/03/24 2048 01/04/24 0548 01/04/24 0853  BP: 124/73 126/83 (!) 147/87   Pulse: (!) 59 61 (!) 59   Resp: 20 20 16    Temp: 97.7 F (36.5 C) 98.4 F (36.9 C) 97.9 F (36.6 C)   TempSrc: Oral Oral Oral   SpO2: 100% 100% 100% 99%  Weight:      Height:        Intake/Output Summary (Last 24 hours) at 01/04/2024 1108 Last data filed at 01/04/2024 0845 Gross per 24 hour  Intake 240 ml  Output 325 ml  Net -85 ml   Filed Weights   01/02/24 1945  Weight: 87.8 kg    Examination:   General exam: Overall comfortable, not in distress HEENT: PERRL Respiratory system:  no wheezes or crackles  Cardiovascular system: S1 & S2 heard, RRR.  Gastrointestinal system: Abdomen is mildly distended, soft and nontender. Central nervous system: Alert and oriented Extremities: No edema, no clubbing ,no cyanosis Skin: No rashes, no ulcers,no icterus      Data Reviewed: I have personally reviewed following labs and imaging studies  CBC: Recent Labs  Lab 01/02/24 1300 01/02/24 1307 01/03/24 0533  WBC 7.3  --  8.1  HGB 13.3 15.0 13.2  HCT 43.5 44.0 43.2  MCV 93.3  --  93.7  PLT 138*  --  134*   Basic Metabolic Panel: Recent Labs  Lab 01/02/24 1300 01/02/24 1307 01/03/24 0533 01/04/24 0536  NA 136 135 137 138  K 4.3 4.2 3.9 3.9  CL 95* 98 98 100  CO2 28  --  23 22  GLUCOSE 101* 101* 105* 100*  BUN 23 26* 20 19  CREATININE 2.20* 2.40* 1.84* 1.81*  CALCIUM  10.0  --  9.6 9.7     No results found for this or any previous visit (from the past 240 hours).   Radiology Studies: US  Abdomen Limited RUQ (LIVER/GB) Result Date: 01/02/2024 CLINICAL DATA:  Right upper quadrant abdominal pain. EXAM: ULTRASOUND ABDOMEN LIMITED RIGHT UPPER QUADRANT COMPARISON:  None Available. FINDINGS: Gallbladder: A 6.4 mm gallstone is seen within the gallbladder neck. There is no evidence of gallbladder wall thickening  (2.0 mm). No sonographic Murphy sign noted by sonographer. Common bile duct: Diameter: 5.2 mm Liver: No focal lesion identified. Within normal limits in parenchymal echogenicity. Portal vein is patent on color Doppler imaging with normal direction of blood flow towards the liver. Other: None. IMPRESSION: Cholelithiasis without evidence of acute cholecystitis. Electronically Signed   By: Suzen Dials M.D.   On: 01/02/2024 17:21   CT ABDOMEN PELVIS WO CONTRAST Result Date: 01/02/2024 CLINICAL DATA:  Acute right lower quadrant abdominal pain. EXAM: CT ABDOMEN AND PELVIS WITHOUT CONTRAST TECHNIQUE: Multidetector CT imaging of the abdomen and pelvis was performed following the standard protocol without IV contrast. RADIATION DOSE REDUCTION: This exam was performed according to the departmental dose-optimization program which includes automated exposure control, adjustment of the mA and/or kV according to patient size and/or use of iterative reconstruction technique. COMPARISON:  April 15, 2023. FINDINGS: Lower chest: No acute abnormality. Hepatobiliary: Mild cholelithiasis. No biliary dilatation. Liver is unremarkable. Pancreas: Unremarkable. No pancreatic ductal dilatation or surrounding inflammatory changes. Spleen: Normal in size without focal abnormality. Adrenals/Urinary Tract: Adrenal glands appear normal. Severe right  renal atrophy is noted. No hydronephrosis or renal obstruction. Urinary bladder is unremarkable. Stomach/Bowel: Stomach is within normal limits. Appendix appears normal. No evidence of bowel wall thickening, distention, or inflammatory changes. Vascular/Lymphatic: Aortic atherosclerosis. No enlarged abdominal or pelvic lymph nodes. Reproductive: Prostate is unremarkable. Other: Small fat containing left inguinal hernia. No ascites. Small fat containing periumbilical hernia. Musculoskeletal: No acute or significant osseous findings. IMPRESSION: 1. Mild cholelithiasis. 2. Severe right renal  atrophy. 3. Small fat containing left inguinal hernia. 4. Small fat containing periumbilical hernia. 5. Aortic atherosclerosis. Aortic Atherosclerosis (ICD10-I70.0). Electronically Signed   By: Lynwood Landy Raddle M.D.   On: 01/02/2024 14:05    Scheduled Meds:  apixaban   5 mg Oral BID   feeding supplement  237 mL Oral BID BM   insulin  aspart  0-9 Units Subcutaneous TID WC   ipratropium-albuterol   3 mL Nebulization BID   levothyroxine   175 mcg Oral Q0600   metoCLOPramide   10 mg Oral TID AC   pantoprazole   40 mg Oral QAC breakfast   polyethylene glycol  17 g Oral BID   senna-docusate  1 tablet Oral BID   Continuous Infusions:   LOS: 0 days   Ivonne Mustache, MD [facility] P9/10/2023, 11:08 AM

## 2024-01-05 ENCOUNTER — Other Ambulatory Visit (HOSPITAL_COMMUNITY): Payer: Self-pay

## 2024-01-05 DIAGNOSIS — R109 Unspecified abdominal pain: Secondary | ICD-10-CM | POA: Diagnosis not present

## 2024-01-05 LAB — GLUCOSE, CAPILLARY: Glucose-Capillary: 123 mg/dL — ABNORMAL HIGH (ref 70–99)

## 2024-01-05 MED ORDER — SENNOSIDES-DOCUSATE SODIUM 8.6-50 MG PO TABS
1.0000 | ORAL_TABLET | Freq: Two times a day (BID) | ORAL | 0 refills | Status: AC
  Filled 2024-01-05: qty 14, 7d supply, fill #0

## 2024-01-05 MED ORDER — POLYETHYLENE GLYCOL 3350 17 GM/SCOOP PO POWD
17.0000 g | Freq: Two times a day (BID) | ORAL | 0 refills | Status: AC
  Filled 2024-01-05: qty 238, 7d supply, fill #0

## 2024-01-05 NOTE — Plan of Care (Signed)
 Problem: Education: Goal: Ability to describe self-care measures that may prevent or decrease complications (Diabetes Survival Skills Education) will improve 01/05/2024 0956 by Terance Leonor BRAVO, RN Outcome: Adequate for Discharge 01/05/2024 0955 by Terance Leonor BRAVO, RN Outcome: Adequate for Discharge 01/05/2024 [phone] by Terance Leonor BRAVO, RN Outcome: Adequate for Discharge Goal: Individualized Educational Video(s) 01/05/2024 0956 by Terance Leonor BRAVO, RN Outcome: Adequate for Discharge 01/05/2024 0955 by Terance Leonor BRAVO, RN Outcome: Progressing 01/05/2024 0837 by Terance Leonor BRAVO, RN Outcome: Adequate for Discharge   Problem: Coping: Goal: Ability to adjust to condition or change in health will improve 01/05/2024 0956 by Terance Leonor BRAVO, RN Outcome: Adequate for Discharge 01/05/2024 0955 by Terance Leonor BRAVO, RN Outcome: Adequate for Discharge 01/05/2024 [phone] by Terance Leonor BRAVO, RN Outcome: Adequate for Discharge   Problem: Fluid Volume: Goal: Ability to maintain a balanced intake and output will improve 01/05/2024 0956 by Terance Leonor BRAVO, RN Outcome: Adequate for Discharge 01/05/2024 0955 by Terance Leonor BRAVO, RN Outcome: Adequate for Discharge 01/05/2024 [phone] by Terance Leonor BRAVO, RN Outcome: Adequate for Discharge   Problem: Health Behavior/Discharge Planning: Goal: Ability to identify and utilize available resources and services will improve 01/05/2024 0956 by Terance Leonor BRAVO, RN Outcome: Adequate for Discharge 01/05/2024 0955 by Terance Leonor BRAVO, RN Outcome: Adequate for Discharge 01/05/2024 [phone] by Terance Leonor BRAVO, RN Outcome: Adequate for Discharge Goal: Ability to manage health-related needs will improve 01/05/2024 0956 by Terance Leonor BRAVO, RN Outcome: Adequate for Discharge 01/05/2024 0955 by Terance Leonor BRAVO, RN Outcome: Adequate for Discharge 01/05/2024 [phone] by Terance Leonor BRAVO, RN Outcome: Adequate for Discharge   Problem:  Metabolic: Goal: Ability to maintain appropriate glucose levels will improve 01/05/2024 0956 by Terance Leonor BRAVO, RN Outcome: Adequate for Discharge 01/05/2024 0955 by Terance Leonor BRAVO, RN Outcome: Adequate for Discharge 01/05/2024 [phone] by Terance Leonor BRAVO, RN Outcome: Adequate for Discharge   Problem: Nutritional: Goal: Maintenance of adequate nutrition will improve 01/05/2024 0956 by Terance Leonor BRAVO, RN Outcome: Adequate for Discharge 01/05/2024 0955 by Terance Leonor BRAVO, RN Outcome: Adequate for Discharge 01/05/2024 [phone] by Terance Leonor BRAVO, RN Outcome: Adequate for Discharge Goal: Progress toward achieving an optimal weight will improve 01/05/2024 0956 by Terance Leonor BRAVO, RN Outcome: Adequate for Discharge 01/05/2024 0955 by Terance Leonor BRAVO, RN Outcome: Adequate for Discharge 01/05/2024 [phone] by Terance Leonor BRAVO, RN Outcome: Adequate for Discharge   Problem: Skin Integrity: Goal: Risk for impaired skin integrity will decrease 01/05/2024 0956 by Terance Leonor BRAVO, RN Outcome: Adequate for Discharge 01/05/2024 0955 by Terance Leonor BRAVO, RN Outcome: Adequate for Discharge 01/05/2024 [phone] by Terance Leonor BRAVO, RN Outcome: Adequate for Discharge   Problem: Tissue Perfusion: Goal: Adequacy of tissue perfusion will improve 01/05/2024 0956 by Terance Leonor BRAVO, RN Outcome: Adequate for Discharge 01/05/2024 0955 by Terance Leonor BRAVO, RN Outcome: Adequate for Discharge 01/05/2024 [phone] by Terance Leonor BRAVO, RN Outcome: Adequate for Discharge   Problem: Education: Goal: Knowledge of General Education information will improve Description: Including pain rating scale, medication(s)/side effects and non-pharmacologic comfort measures 01/05/2024 0956 by Terance Leonor BRAVO, RN Outcome: Adequate for Discharge 01/05/2024 0955 by Terance Leonor BRAVO, RN Outcome: Adequate for Discharge 01/05/2024 [phone] by Terance Leonor BRAVO, RN Outcome: Adequate for Discharge   Problem:  Health Behavior/Discharge Planning: Goal: Ability to manage health-related needs will improve 01/05/2024 0956 by Terance Leonor BRAVO, RN Outcome: Adequate for Discharge 01/05/2024 0955 by Terance Leonor BRAVO, RN Outcome: Adequate for Discharge 01/05/2024 [phone] by Terance Leonor  E, RN Outcome: Adequate for Discharge   Problem: Clinical Measurements: Goal: Ability to maintain clinical measurements within normal limits will improve 01/05/2024 0956 by Terance Leonor BRAVO, RN Outcome: Adequate for Discharge 01/05/2024 0955 by Terance Leonor BRAVO, RN Outcome: Adequate for Discharge 01/05/2024 [phone] by Terance Leonor BRAVO, RN Outcome: Adequate for Discharge Goal: Will remain free from infection 01/05/2024 0956 by Terance Leonor BRAVO, RN Outcome: Adequate for Discharge 01/05/2024 0955 by Terance Leonor BRAVO, RN Outcome: Adequate for Discharge 01/05/2024 [phone] by Terance Leonor BRAVO, RN Outcome: Adequate for Discharge Goal: Diagnostic test results will improve 01/05/2024 0956 by Terance Leonor BRAVO, RN Outcome: Adequate for Discharge 01/05/2024 0955 by Terance Leonor BRAVO, RN Outcome: Adequate for Discharge 01/05/2024 0837 by Terance Leonor BRAVO, RN Outcome: Adequate for Discharge Goal: Respiratory complications will improve 01/05/2024 0956 by Terance Leonor BRAVO, RN Outcome: Adequate for Discharge 01/05/2024 0955 by Terance Leonor BRAVO, RN Outcome: Adequate for Discharge 01/05/2024 [phone] by Terance Leonor BRAVO, RN Outcome: Adequate for Discharge Goal: Cardiovascular complication will be avoided 01/05/2024 0956 by Terance Leonor BRAVO, RN Outcome: Adequate for Discharge 01/05/2024 0955 by Terance Leonor BRAVO, RN Outcome: Adequate for Discharge 01/05/2024 [phone] by Terance Leonor BRAVO, RN Outcome: Adequate for Discharge   Problem: Activity: Goal: Risk for activity intolerance will decrease 01/05/2024 0956 by Terance Leonor BRAVO, RN Outcome: Adequate for Discharge 01/05/2024 0955 by Terance Leonor BRAVO, RN Outcome:  Adequate for Discharge 01/05/2024 [phone] by Terance Leonor BRAVO, RN Outcome: Adequate for Discharge   Problem: Nutrition: Goal: Adequate nutrition will be maintained 01/05/2024 0956 by Terance Leonor BRAVO, RN Outcome: Adequate for Discharge 01/05/2024 0955 by Terance Leonor BRAVO, RN Outcome: Adequate for Discharge 01/05/2024 [phone] by Terance Leonor BRAVO, RN Outcome: Adequate for Discharge   Problem: Coping: Goal: Level of anxiety will decrease 01/05/2024 0956 by Terance Leonor BRAVO, RN Outcome: Adequate for Discharge 01/05/2024 0955 by Terance Leonor BRAVO, RN Outcome: Adequate for Discharge 01/05/2024 [phone] by Terance Leonor BRAVO, RN Outcome: Adequate for Discharge   Problem: Elimination: Goal: Will not experience complications related to bowel motility 01/05/2024 0956 by Terance Leonor BRAVO, RN Outcome: Adequate for Discharge 01/05/2024 0955 by Terance Leonor BRAVO, RN Outcome: Adequate for Discharge 01/05/2024 [phone] by Terance Leonor BRAVO, RN Outcome: Adequate for Discharge Goal: Will not experience complications related to urinary retention 01/05/2024 0956 by Terance Leonor BRAVO, RN Outcome: Adequate for Discharge 01/05/2024 0955 by Terance Leonor BRAVO, RN Outcome: Adequate for Discharge 01/05/2024 [phone] by Terance Leonor BRAVO, RN Outcome: Adequate for Discharge   Problem: Pain Managment: Goal: General experience of comfort will improve and/or be controlled 01/05/2024 0956 by Terance Leonor BRAVO, RN Outcome: Adequate for Discharge 01/05/2024 0955 by Terance Leonor BRAVO, RN Outcome: Adequate for Discharge 01/05/2024 [phone] by Terance Leonor BRAVO, RN Outcome: Adequate for Discharge   Problem: Safety: Goal: Ability to remain free from injury will improve 01/05/2024 0956 by Terance Leonor BRAVO, RN Outcome: Adequate for Discharge 01/05/2024 0955 by Terance Leonor BRAVO, RN Outcome: Adequate for Discharge 01/05/2024 [phone] by Terance Leonor BRAVO, RN Outcome: Adequate for Discharge   Problem: Skin  Integrity: Goal: Risk for impaired skin integrity will decrease 01/05/2024 0956 by Terance Leonor BRAVO, RN Outcome: Adequate for Discharge 01/05/2024 0955 by Terance Leonor BRAVO, RN Outcome: Adequate for Discharge 01/05/2024 [phone] by Terance Leonor BRAVO, RN Outcome: Adequate for Discharge

## 2024-01-05 NOTE — Discharge Summary (Signed)
 Physician Discharge Summary  [name] FMW:[id number] DOB: 09/28/1950 DOA: 01/02/2024  PCP: Larnell Hamilton, MD  Admit date: 01/02/2024 Discharge date: 01/05/2024  Admitted From: Home Disposition:  Home  Discharge Condition:Stable CODE STATUS:DNR Diet recommendation: Heart Healthy    Brief/Interim Summary: Patient is a 73 year old male with history of aortic stenosis, status post aortic valve replacement, mitral regurgitation, asthma, chronic A-fib, HFpEF, COPD, hypertension, hyperlipidemia, hypothyroidism, left renal cell carcinoma, OSA not on CPAP, secondary AV block status post pacemaker placement who presented with complaint of abdominal pain, nausea, vomiting, low appetite, lack of bowel movement. On presentation, lab work showed creatinine of 2.2, normal liver enzymes. CT abdomen/pelvis showed cholelithiasis without evidence of cholecystitis, severe right renal atrophy, a small fat-containing periumbilical hernia. Vital signs were stable. No bowel movement  for last 7 days. Aggressive bowel regimen started .  He started having regular bowel movements now.  No nausea or abdominal pain.  Medically stable for discharge home today.  Following problems were addressed during the hospitalization:   Abdominal pain/nausea/vomiting:   CT abdomen/pelvis did not show any acute findings.  Right upper quadrant ultrasound showed cholelithiasis without evidence of cholecystitis.  Likely from lack of bowel movement No bowel movement for last several days.  Started having regular bowel movement after given enema, Senokot, MiraLAX .  Continue aggressive bowel regimen at home.  No nausea, abdomen pain or vomiting  Diabetes type 2: Currently on sliding scale.  On Jardiance  at home.   AKI in CKD stage IIIb: Baseline creatinine is around 1.5-2.  Presented with creatinine of 2.85.  Currently kidney function at baseline.  CT imaging showed severe right renal atrophy.  Follows with [facility]   Persistent  A-fib: On Eliquis  and bisoprolol .  Monitor on telemetry.  Currently rate well-controlled, on normal sinus rhythm.  Status post ablation, is status post pacer placement, now on rate control strategy.  Remains in paced rhythm   History of coronary artery disease: On beta-blocker, statin..  No anginal symptoms.  Also has history of aortic stenosis status post atrial valve replacement   Chronic HFpEF: Follows with cardiology.  Echo done on 06/11/2022 showed EF of 60 to 65%, grade 3 diastolic dysfunction, pulmonary hypertension.  He follows with cardiology.  On Jardiance , torsemide , aspirin , bisoprolol  at home   Hypertension: On bisoprolol , aspirin  lactone, torsemide .  Continue the same   History of COPD/asthma/chronic hypoxic respiratory failure:  On 2 L of oxygen  at home chronically.  Follows-up with pulmonology.  Currently not on exacerbation   Hypertension: Currently blood pressure stable.  Continue current medications   Deconditioning: Will consult PT, no follow-up recommended  Discharge Diagnoses:  Principal Problem:   Intractable abdominal pain Active Problems:   Hypothyroidism   DM2 (diabetes mellitus, type 2) (HCC)   Chronic kidney disease, stage 3b (HCC)   PAF (paroxysmal atrial fibrillation) (HCC)   Essential hypertension   Intrinsic asthma   GERD without esophagitis   Chronic diastolic CHF (congestive heart failure) (HCC)   CAD (coronary artery disease)   Dyslipidemia   Intractable nausea    Discharge Instructions  Discharge Instructions     Diet - low sodium heart healthy   Complete by: As directed    Discharge instructions   Complete by: As directed    1)Please take your medications as instructed.  Continue bowel regimen 2)Follow up with your PCP next week   Increase activity slowly   Complete by: As directed       Allergies as of 01/05/2024  Reactions   Cephalexin  Rash, Other (See Comments)   Broke out all over   Meloxicam Rash, Other (See Comments)    Red Man Syndrome, also   Vancomycin  Other (See Comments)   Red Man's syndrome 09/02/15, resolved with diphenhydramine  and slowing of rate        Medication List     TAKE these medications    acetaminophen  325 MG tablet Commonly known as: TYLENOL  Take 2 tablets (650 mg total) by mouth every 6 (six) hours as needed. What changed: reasons to take this   albuterol  108 (90 Base) MCG/ACT inhaler Commonly known as: VENTOLIN  HFA Inhale 2 puffs into the lungs every 6 (six) hours as needed for wheezing or shortness of breath.   allopurinol 300 MG tablet Commonly known as: ZYLOPRIM Take 300 mg by mouth daily.   apixaban  5 MG Tabs tablet Commonly known as: Eliquis  Take 1 tablet (5 mg total) by mouth 2 (two) times daily.   bisoprolol  5 MG tablet Commonly known as: ZEBETA  Take 1 tablet (5 mg total) by mouth daily. Please call office to arrange follow up appointment What changed: additional instructions   Breztri  Aerosphere 160-9-4.8 MCG/ACT Aero inhaler Generic drug: budesonide -glycopyrrolate-formoterol  Inhale 2 puffs into the lungs in the morning and at bedtime.   Cal-Mag-Zinc -D Tabs Take 1 tablet by mouth at bedtime.   colchicine 0.6 MG tablet Take 0.6 mg by mouth daily as needed (for gout flares- as directed).   empagliflozin  10 MG Tabs tablet Commonly known as: Jardiance  Take 1 tablet (10 mg total) by mouth daily.   gabapentin  300 MG capsule Commonly known as: NEURONTIN  Take 2 capsules (600 mg total) by mouth 3 (three) times daily. for neuropathy What changed:  when to take this additional instructions   ipratropium 0.03 % nasal spray Commonly known as: ATROVENT  Place 2 sprays into both nostrils 2 (two) times daily. What changed:  when to take this reasons to take this   ipratropium-albuterol  0.5-2.5 (3) MG/3ML Soln Commonly known as: DUONEB Take 3 mLs by nebulization every 6 (six) hours as needed. What changed: reasons to take this   levothyroxine  175 MCG  tablet Commonly known as: SYNTHROID  Take 175 mcg by mouth daily before breakfast.   montelukast  10 MG tablet Commonly known as: SINGULAIR  TAKE 1 TABLET(10 MG) BY MOUTH AT BEDTIME   multivitamin with minerals Tabs tablet Take 1 tablet by mouth daily.   ondansetron  4 MG tablet Commonly known as: ZOFRAN  Take 1 tablet (4 mg total) by mouth every 6 (six) hours. What changed:  when to take this reasons to take this   oxyCODONE -acetaminophen  5-325 MG tablet Commonly known as: PERCOCET/ROXICET Take 1 tablet by mouth every 6 (six) hours as needed for severe pain (pain score 7-10). What changed: when to take this   OXYGEN  Inhale 2 L/min into the lungs continuous.   pantoprazole  40 MG tablet Commonly known as: PROTONIX  Take 40 mg by mouth daily before breakfast.   polyethylene glycol 17 g packet Commonly known as: MIRALAX  / GLYCOLAX  Take 17 g by mouth 2 (two) times daily. What changed:  when to take this reasons to take this   potassium chloride  SA 20 MEQ tablet Commonly known as: KLOR-CON  M Take 1 tablet (20 mEq total) by mouth 2 (two) times daily.   predniSONE  2.5 MG tablet Commonly known as: DELTASONE  Take 1 tablet (2.5 mg total) by mouth daily with breakfast.   roflumilast  500 MCG Tabs tablet Commonly known as: DALIRESP  Take 1 tablet (500 mcg  total) by mouth daily.   rOPINIRole  1 MG tablet Commonly known as: REQUIP  Take 1 tablet (1 mg total) by mouth in the morning and at bedtime. What changed: when to take this   rosuvastatin  10 MG tablet Commonly known as: CRESTOR  Take 10 mg by mouth at bedtime.   senna-docusate 8.6-50 MG tablet Commonly known as: Senokot-S Take 1 tablet by mouth 2 (two) times daily for 7 days. What changed: how much to take   spironolactone  25 MG tablet Commonly known as: ALDACTONE  Take 1 tablet (25 mg total) by mouth daily. What changed: how much to take   Systane Ultra PF 0.4-0.3 % Soln Generic drug: Polyethyl Glyc-Propyl Glyc  PF Place 2 drops into both eyes 3 (three) times daily as needed (for dryness).   TIGER BALM ARTHRITIS RUB EX Apply 1 application  topically as needed (for pain).   torsemide  20 MG tablet Commonly known as: DEMADEX  Take 40 mg alternating with 20 mg daily. What changed:  how much to take how to take this when to take this additional instructions   traZODone  50 MG tablet Commonly known as: DESYREL  Take 50 mg by mouth at bedtime.   Vitamin D3 50 MCG (2000 UT) Tabs Take 4,000 Units by mouth daily.        Follow-up Information     Larnell Hamilton, MD. Schedule an appointment as soon as possible for a visit in 1 week(s).   Specialty: Internal Medicine Contact information: [address] [place] [address] [phone]                Allergies  Allergen Reactions   Cephalexin  Rash and Other (See Comments)    Broke out all over   Meloxicam Rash and Other (See Comments)    Red Man Syndrome, also   Vancomycin  Other (See Comments)    Red Man's syndrome 09/02/15, resolved with diphenhydramine  and slowing of rate    Consultations: None   Procedures/Studies: US  Abdomen Limited RUQ (LIVER/GB) Result Date: 01/02/2024 CLINICAL DATA:  Right upper quadrant abdominal pain. EXAM: ULTRASOUND ABDOMEN LIMITED RIGHT UPPER QUADRANT COMPARISON:  None Available. FINDINGS: Gallbladder: A 6.4 mm gallstone is seen within the gallbladder neck. There is no evidence of gallbladder wall thickening (2.0 mm). No sonographic Murphy sign noted by sonographer. Common bile duct: Diameter: 5.2 mm Liver: No focal lesion identified. Within normal limits in parenchymal echogenicity. Portal vein is patent on color Doppler imaging with normal direction of blood flow towards the liver. Other: None. IMPRESSION: Cholelithiasis without evidence of acute cholecystitis. Electronically Signed   By: Suzen Dials M.D.   On: 01/02/2024 17:21   CT ABDOMEN PELVIS WO CONTRAST Result Date: 01/02/2024 CLINICAL  DATA:  Acute right lower quadrant abdominal pain. EXAM: CT ABDOMEN AND PELVIS WITHOUT CONTRAST TECHNIQUE: Multidetector CT imaging of the abdomen and pelvis was performed following the standard protocol without IV contrast. RADIATION DOSE REDUCTION: This exam was performed according to the departmental dose-optimization program which includes automated exposure control, adjustment of the mA and/or kV according to patient size and/or use of iterative reconstruction technique. COMPARISON:  April 15, 2023. FINDINGS: Lower chest: No acute abnormality. Hepatobiliary: Mild cholelithiasis. No biliary dilatation. Liver is unremarkable. Pancreas: Unremarkable. No pancreatic ductal dilatation or surrounding inflammatory changes. Spleen: Normal in size without focal abnormality. Adrenals/Urinary Tract: Adrenal glands appear normal. Severe right renal atrophy is noted. No hydronephrosis or renal obstruction. Urinary bladder is unremarkable. Stomach/Bowel: Stomach is within normal limits. Appendix appears normal. No evidence of bowel wall  thickening, distention, or inflammatory changes. Vascular/Lymphatic: Aortic atherosclerosis. No enlarged abdominal or pelvic lymph nodes. Reproductive: Prostate is unremarkable. Other: Small fat containing left inguinal hernia. No ascites. Small fat containing periumbilical hernia. Musculoskeletal: No acute or significant osseous findings. IMPRESSION: 1. Mild cholelithiasis. 2. Severe right renal atrophy. 3. Small fat containing left inguinal hernia. 4. Small fat containing periumbilical hernia. 5. Aortic atherosclerosis. Aortic Atherosclerosis (ICD10-I70.0). Electronically Signed   By: Lynwood Landy Raddle M.D.   On: 01/02/2024 14:05      Subjective: Patient seen and examined at bedside today.  Hemodynamically stable.  Comfortably sitting in the chair.  No abdominal pain no nausea or vomiting.  He had a good bowel movement yesterday.  Feels ready to go home.  Discharge Exam: Vitals:    01/05/24 0517 01/05/24 0752  BP: 133/80   Pulse: 63   Resp: 15   Temp: 97.8 F (36.6 C)   SpO2: 93% 95%   Vitals:   01/04/24 2017 01/04/24 2107 01/05/24 0517 01/05/24 0752  BP:  (!) 149/79 133/80   Pulse:  60 63   Resp:  15 15   Temp:  98.3 F (36.8 C) 97.8 F (36.6 C)   TempSrc:      SpO2: 97% 95% 93% 95%  Weight:      Height:        General: Pt is alert, awake, not in acute distress, chronically deconditioned Cardiovascular: RRR, S1/S2 +, no rubs, no gallops Respiratory: CTA bilaterally, no wheezing, no rhonchi Abdominal: Soft, NT, ND, bowel sounds + Extremities: no edema, no cyanosis    The results of significant diagnostics from this hospitalization (including imaging, microbiology, ancillary and laboratory) are listed below for reference.     Microbiology: No results found for this or any previous visit (from the past 240 hours).   Labs: BNP (last 3 results) Recent Labs    01/31/23 0848 04/15/23 1115 12/15/23 1310  BNP 406.0* 180.3* 190.0*   Basic Metabolic Panel: Recent Labs  Lab 01/02/24 1300 01/02/24 1307 01/03/24 0533 01/04/24 0536  NA 136 135 137 138  K 4.3 4.2 3.9 3.9  CL 95* 98 98 100  CO2 28  --  23 22  GLUCOSE 101* 101* 105* 100*  BUN 23 26* 20 19  CREATININE 2.20* 2.40* 1.84* 1.81*  CALCIUM  10.0  --  9.6 9.7   Liver Function Tests: Recent Labs  Lab 01/02/24 1300 01/03/24 0533  AST 30 28  ALT 22 19  ALKPHOS 81 76  BILITOT 0.7 0.7  PROT 7.4 7.0  ALBUMIN  4.5 4.2   Recent Labs  Lab 01/02/24 1300  LIPASE 18   No results for input(s): AMMONIA in the last 168 hours. CBC: Recent Labs  Lab 01/02/24 1300 01/02/24 1307 01/03/24 0533  WBC 7.3  --  8.1  HGB 13.3 15.0 13.2  HCT 43.5 44.0 43.2  MCV 93.3  --  93.7  PLT 138*  --  134*   Cardiac Enzymes: No results for input(s): CKTOTAL, CKMB, CKMBINDEX, TROPONINI in the last 168 hours. BNP: Invalid input(s): POCBNP CBG: Recent Labs  Lab 01/04/24 0734  01/04/24 1141 01/04/24 1649 01/04/24 2129 01/05/24 0719  GLUCAP 119* 127* 102* 158* 123*   D-Dimer No results for input(s): DDIMER in the last 72 hours. Hgb A1c Recent Labs    01/03/24 0533  HGBA1C 6.5*   Lipid Profile No results for input(s): CHOL, HDL, LDLCALC, TRIG, CHOLHDL, LDLDIRECT in the last 72 hours. Thyroid  function studies No results for input(s):  TSH, T4TOTAL, T3FREE, THYROIDAB in the last 72 hours.  Invalid input(s): FREET3 Anemia work up No results for input(s): VITAMINB12, FOLATE, FERRITIN, TIBC, IRON, RETICCTPCT in the last 72 hours. Urinalysis    Component Value Date/Time   COLORURINE YELLOW 01/02/2024 1520   APPEARANCEUR CLEAR 01/02/2024 1520   LABSPEC 1.016 01/02/2024 1520   PHURINE 5.0 01/02/2024 1520   GLUCOSEU 150 (A) 01/02/2024 1520   HGBUR NEGATIVE 01/02/2024 1520   BILIRUBINUR NEGATIVE 01/02/2024 1520   KETONESUR 5 (A) 01/02/2024 1520   PROTEINUR 100 (A) 01/02/2024 1520   NITRITE NEGATIVE 01/02/2024 1520   LEUKOCYTESUR NEGATIVE 01/02/2024 1520   Sepsis Labs Recent Labs  Lab 01/02/24 1300 01/03/24 0533  WBC 7.3 8.1   Microbiology No results found for this or any previous visit (from the past 240 hours).  Please note: You were cared for by a hospitalist during your hospital stay. Once you are discharged, your primary care physician will handle any further medical issues. Please note that NO REFILLS for any discharge medications will be authorized once you are discharged, as it is imperative that you return to your primary care physician (or establish a relationship with a primary care physician if you do not have one) for your post hospital discharge needs so that they can reassess your need for medications and monitor your lab values.    Time coordinating discharge: 40 minutes  SIGNED:   Ivonne Mustache, MD  [facility] 01/05/2024, 9:43 AM Pager [phone]  If 7PM-7AM, please contact  night-coverage [email or web address] Password [email or web address]

## 2024-01-05 NOTE — Plan of Care (Signed)
 Problem: Education: Goal: Individualized Educational Video(s) 01/05/2024 0955 by Terance Leonor BRAVO, RN Outcome: Progressing 01/05/2024 0837 by Terance Leonor BRAVO, RN Outcome: Adequate for Discharge   Problem: Education: Goal: Ability to describe self-care measures that may prevent or decrease complications (Diabetes Survival Skills Education) will improve 01/05/2024 0955 by Terance Leonor BRAVO, RN Outcome: Adequate for Discharge 01/05/2024 0837 by Terance Leonor BRAVO, RN Outcome: Adequate for Discharge   Problem: Coping: Goal: Ability to adjust to condition or change in health will improve 01/05/2024 0955 by Terance Leonor BRAVO, RN Outcome: Adequate for Discharge 01/05/2024 0837 by Terance Leonor BRAVO, RN Outcome: Adequate for Discharge   Problem: Fluid Volume: Goal: Ability to maintain a balanced intake and output will improve 01/05/2024 0955 by Terance Leonor BRAVO, RN Outcome: Adequate for Discharge 01/05/2024 0837 by Terance Leonor BRAVO, RN Outcome: Adequate for Discharge   Problem: Health Behavior/Discharge Planning: Goal: Ability to identify and utilize available resources and services will improve 01/05/2024 0955 by Terance Leonor BRAVO, RN Outcome: Adequate for Discharge 01/05/2024 0837 by Terance Leonor BRAVO, RN Outcome: Adequate for Discharge Goal: Ability to manage health-related needs will improve 01/05/2024 0955 by Terance Leonor BRAVO, RN Outcome: Adequate for Discharge 01/05/2024 0837 by Terance Leonor BRAVO, RN Outcome: Adequate for Discharge   Problem: Metabolic: Goal: Ability to maintain appropriate glucose levels will improve 01/05/2024 0955 by Terance Leonor BRAVO, RN Outcome: Adequate for Discharge 01/05/2024 [phone] by Terance Leonor BRAVO, RN Outcome: Adequate for Discharge   Problem: Nutritional: Goal: Maintenance of adequate nutrition will improve 01/05/2024 0955 by Terance Leonor BRAVO, RN Outcome: Adequate for Discharge 01/05/2024 [phone] by Terance Leonor BRAVO, RN Outcome:  Adequate for Discharge Goal: Progress toward achieving an optimal weight will improve 01/05/2024 0955 by Terance Leonor BRAVO, RN Outcome: Adequate for Discharge 01/05/2024 [phone] by Terance Leonor BRAVO, RN Outcome: Adequate for Discharge   Problem: Skin Integrity: Goal: Risk for impaired skin integrity will decrease 01/05/2024 0955 by Terance Leonor BRAVO, RN Outcome: Adequate for Discharge 01/05/2024 [phone] by Terance Leonor BRAVO, RN Outcome: Adequate for Discharge   Problem: Tissue Perfusion: Goal: Adequacy of tissue perfusion will improve 01/05/2024 0955 by Terance Leonor BRAVO, RN Outcome: Adequate for Discharge 01/05/2024 0837 by Terance Leonor BRAVO, RN Outcome: Adequate for Discharge   Problem: Education: Goal: Knowledge of General Education information will improve Description: Including pain rating scale, medication(s)/side effects and non-pharmacologic comfort measures 01/05/2024 0955 by Terance Leonor BRAVO, RN Outcome: Adequate for Discharge 01/05/2024 [phone] by Terance Leonor BRAVO, RN Outcome: Adequate for Discharge   Problem: Health Behavior/Discharge Planning: Goal: Ability to manage health-related needs will improve 01/05/2024 0955 by Terance Leonor BRAVO, RN Outcome: Adequate for Discharge 01/05/2024 [phone] by Terance Leonor BRAVO, RN Outcome: Adequate for Discharge   Problem: Clinical Measurements: Goal: Ability to maintain clinical measurements within normal limits will improve 01/05/2024 0955 by Terance Leonor BRAVO, RN Outcome: Adequate for Discharge 01/05/2024 [phone] by Terance Leonor BRAVO, RN Outcome: Adequate for Discharge Goal: Will remain free from infection 01/05/2024 0955 by Terance Leonor BRAVO, RN Outcome: Adequate for Discharge 01/05/2024 [phone] by Terance Leonor BRAVO, RN Outcome: Adequate for Discharge Goal: Diagnostic test results will improve 01/05/2024 0955 by Terance Leonor BRAVO, RN Outcome: Adequate for Discharge 01/05/2024 0837 by Terance Leonor BRAVO, RN Outcome: Adequate for  Discharge Goal: Respiratory complications will improve 01/05/2024 0955 by Terance Leonor BRAVO, RN Outcome: Adequate for Discharge 01/05/2024 0837 by Terance Leonor BRAVO, RN Outcome: Adequate for Discharge Goal: Cardiovascular complication will be avoided 01/05/2024 0955 by Terance Leonor BRAVO,  RN Outcome: Adequate for Discharge 01/05/2024 0837 by Terance Leonor BRAVO, RN Outcome: Adequate for Discharge   Problem: Activity: Goal: Risk for activity intolerance will decrease 01/05/2024 0955 by Terance Leonor BRAVO, RN Outcome: Adequate for Discharge 01/05/2024 [phone] by Terance Leonor BRAVO, RN Outcome: Adequate for Discharge   Problem: Nutrition: Goal: Adequate nutrition will be maintained 01/05/2024 0955 by Terance Leonor BRAVO, RN Outcome: Adequate for Discharge 01/05/2024 0837 by Terance Leonor BRAVO, RN Outcome: Adequate for Discharge   Problem: Coping: Goal: Level of anxiety will decrease 01/05/2024 0955 by Terance Leonor BRAVO, RN Outcome: Adequate for Discharge 01/05/2024 0837 by Terance Leonor BRAVO, RN Outcome: Adequate for Discharge   Problem: Elimination: Goal: Will not experience complications related to bowel motility 01/05/2024 0955 by Terance Leonor BRAVO, RN Outcome: Adequate for Discharge 01/05/2024 0837 by Terance Leonor BRAVO, RN Outcome: Adequate for Discharge Goal: Will not experience complications related to urinary retention 01/05/2024 0955 by Terance Leonor BRAVO, RN Outcome: Adequate for Discharge 01/05/2024 0837 by Terance Leonor BRAVO, RN Outcome: Adequate for Discharge   Problem: Pain Managment: Goal: General experience of comfort will improve and/or be controlled 01/05/2024 0955 by Terance Leonor BRAVO, RN Outcome: Adequate for Discharge 01/05/2024 [phone] by Terance Leonor BRAVO, RN Outcome: Adequate for Discharge   Problem: Safety: Goal: Ability to remain free from injury will improve 01/05/2024 0955 by Terance Leonor BRAVO, RN Outcome: Adequate for Discharge 01/05/2024 0837 by Terance Leonor BRAVO, RN Outcome: Adequate for Discharge   Problem: Skin Integrity: Goal: Risk for impaired skin integrity will decrease 01/05/2024 0955 by Terance Leonor BRAVO, RN Outcome: Adequate for Discharge 01/05/2024 0837 by Terance Leonor BRAVO, RN Outcome: Adequate for Discharge

## 2024-01-05 NOTE — Plan of Care (Signed)

## 2024-01-06 ENCOUNTER — Other Ambulatory Visit (HOSPITAL_COMMUNITY): Payer: Self-pay | Admitting: Cardiology

## 2024-01-06 MED ORDER — BISOPROLOL FUMARATE 5 MG PO TABS
5.0000 mg | ORAL_TABLET | Freq: Every day | ORAL | 11 refills | Status: AC
Start: 2024-01-06 — End: ?

## 2024-01-12 ENCOUNTER — Encounter (HOSPITAL_COMMUNITY): Payer: Self-pay

## 2024-01-12 ENCOUNTER — Ambulatory Visit (HOSPITAL_COMMUNITY)
Admission: RE | Admit: 2024-01-12 | Discharge: 2024-01-12 | Disposition: A | Discharge status: Home / Self Care | Source: Ambulatory Visit | Attending: Physician Assistant | Admitting: Physician Assistant

## 2024-01-12 VITALS — BP 136/74 | HR 63 | Ht 71.0 in | Wt 200.0 lb

## 2024-01-12 DIAGNOSIS — E1122 Type 2 diabetes mellitus with diabetic chronic kidney disease: Secondary | ICD-10-CM | POA: Insufficient documentation

## 2024-01-12 DIAGNOSIS — N261 Atrophy of kidney (terminal): Secondary | ICD-10-CM | POA: Diagnosis not present

## 2024-01-12 DIAGNOSIS — Z953 Presence of xenogenic heart valve: Secondary | ICD-10-CM | POA: Insufficient documentation

## 2024-01-12 DIAGNOSIS — I4819 Other persistent atrial fibrillation: Secondary | ICD-10-CM | POA: Diagnosis not present

## 2024-01-12 DIAGNOSIS — I5032 Chronic diastolic (congestive) heart failure: Secondary | ICD-10-CM | POA: Insufficient documentation

## 2024-01-12 DIAGNOSIS — N1832 Chronic kidney disease, stage 3b: Secondary | ICD-10-CM | POA: Diagnosis not present

## 2024-01-12 DIAGNOSIS — J9611 Chronic respiratory failure with hypoxia: Secondary | ICD-10-CM | POA: Diagnosis not present

## 2024-01-12 DIAGNOSIS — Z7984 Long term (current) use of oral hypoglycemic drugs: Secondary | ICD-10-CM | POA: Insufficient documentation

## 2024-01-12 DIAGNOSIS — J441 Chronic obstructive pulmonary disease with (acute) exacerbation: Secondary | ICD-10-CM | POA: Diagnosis not present

## 2024-01-12 DIAGNOSIS — I2510 Atherosclerotic heart disease of native coronary artery without angina pectoris: Secondary | ICD-10-CM | POA: Insufficient documentation

## 2024-01-12 DIAGNOSIS — Z950 Presence of cardiac pacemaker: Secondary | ICD-10-CM | POA: Diagnosis not present

## 2024-01-12 DIAGNOSIS — Z7901 Long term (current) use of anticoagulants: Secondary | ICD-10-CM | POA: Insufficient documentation

## 2024-01-12 DIAGNOSIS — I272 Pulmonary hypertension, unspecified: Secondary | ICD-10-CM | POA: Diagnosis not present

## 2024-01-12 DIAGNOSIS — Z89611 Acquired absence of right leg above knee: Secondary | ICD-10-CM | POA: Insufficient documentation

## 2024-01-12 DIAGNOSIS — I50810 Right heart failure, unspecified: Secondary | ICD-10-CM | POA: Diagnosis not present

## 2024-01-12 DIAGNOSIS — Z9981 Dependence on supplemental oxygen: Secondary | ICD-10-CM | POA: Insufficient documentation

## 2024-01-12 DIAGNOSIS — Z79899 Other long term (current) drug therapy: Secondary | ICD-10-CM | POA: Diagnosis not present

## 2024-01-12 DIAGNOSIS — I350 Nonrheumatic aortic (valve) stenosis: Secondary | ICD-10-CM | POA: Diagnosis not present

## 2024-01-12 DIAGNOSIS — I130 Hypertensive heart and chronic kidney disease with heart failure and stage 1 through stage 4 chronic kidney disease, or unspecified chronic kidney disease: Secondary | ICD-10-CM | POA: Insufficient documentation

## 2024-01-12 DIAGNOSIS — Z9889 Other specified postprocedural states: Secondary | ICD-10-CM | POA: Insufficient documentation

## 2024-01-12 NOTE — Patient Instructions (Signed)
 No change in medications. Return to see Dr. Cherrie in 4 months. PLEASE CALL [phone] IN DECEMBER TO SCHEDULE THIS APPOINTMENT.  Please call us  at [phone] if any questions or concerns prior to your next appointment.

## 2024-01-12 NOTE — Progress Notes (Signed)
 [facility]   Primary Care: Larnell Hamilton, MD HF Cardiologist: Dr. Cherrie  HPI: [name] is a 73 y.o. male with a PMH of HTN, COPD with chronic respiratory failure (on 2L O2), severe COPD, Afib s/p ablation s/p Pacer, Normal cors by cath in 2014, and Aortic Stenosis s/p AVR + MAZE, R AKA.    Pt admitted and planned for [facility] 02/05/16. Diuresed with IV lasix  with rapid improvement of symptoms. LHC 02/05/16 with no significant CAD. RHC showed significant difference between PCWP (50) and LVEDP (30) suggestive of MS but no MS on Echo.    Admitted 12/17 for Tikosyn  load. RHC completed during that admission as below.   Admitted 6/21 with recurrent respiratory failure and lactic acidosis felt to be mostly COPD flare. Had recurrent AF and underwent TEE-DC-CV on 10/19/19. Tikosyn  continued.  Admitted 9/21 with SOB/chills, leukocytosis (WBC 15.9k), concerning for COVID infection versus COPD exacerbation. Diuresed with IV lasix  on presentation,    02/25/20 DCCV with Dr. Okey maintained NSR only for 5 hrs.Underwent repeat AF ablation 04/19/20. Returned to AF 04/21/20.   Repeat DC-CV 05/25/20 -> AF  Repeat DC-CV 07/11/19. PPM at [facility]   PPM generator change 4/22.   Admitted 2/23 with CP. Echo showed EF 55-60%, RV normal. Cards consulted and felt CP was atypical and no further work up.   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  Echo 2/25 showed EF 60-65%, G3DD, interventricular septum flattened in systole, RV moderately reduced, PASP 78 mmhg, moderate MR and TR.   Seen for f/u in 08/25. He was volume overloaded after dose of Torsemide  had been cut back by his Nephrologist 6 weeks prior. Torsemide  increased to 40 mg daily. He later developed AKI, Torsemide  reduced to 40 mg alternating with 20 mg every other day.  Admitted earlier this month with abdominal pain, nausea and vomiting. CT A/P and  RUQ US  without clear etiology. Symptoms improved after bowel movement/aggressive bowel regimen.  He is here today for 1 month HF follow-up.  Wife is present and assists with the history. Stable from HF standpoint. No significant lower extremity edema or abdominal bloating. Ambulates short distances with his prosthesis. Gets winded easily with activity and has a lot of issues with chronic lower extremity pain. Has been sleeping in a recliner for years. Taking all medications as prescribed.    PMH, current medications, allergies, social history, and family history reviewed in epic.  Cardiac Studies:  - Echo 2/25:  EF 60-65%, G3DD, interventricular septum flattened in systole, RV moderately reduced, PASP 78 mmhg, moderate MR and TR.   - Echo (2/23): EF 55-60%, normal RV, AV stable, mean gradient 11.0 mmHg, AAA 38 mm   - PFTs (4/17) FEV1  1.6 (47%) FVC 2.25 (49%) DLCO 54%   - R/LHC (02/05/16): mild non-obs CAD; RA 30, PA 100/56 (72), PCWP 51, CO/CI (Fick) 6.21/2.87, PVR 3.5 WU  - R/LHC (4/17): no significant CAD; RA 13, PA 60/18 (38), PCWP 23, CO/CI (Fick) 5.39/2.48   Current Outpatient Medications on File Prior to Encounter  Medication Sig Dispense Refill   acetaminophen  (TYLENOL ) 325 MG tablet Take 2 tablets (650 mg total) by mouth every 6 (six) hours as needed. (Patient taking differently: Take 650 mg by mouth every 6 (six) hours as needed for mild pain (pain score 1-3) (or headaches).)     albuterol  (VENTOLIN  HFA) 108 (90 Base) MCG/ACT inhaler  Inhale 2 puffs into the lungs every 6 (six) hours as needed for wheezing or shortness of breath. 1 each 5   allopurinol (ZYLOPRIM) 300 MG tablet Take 300 mg by mouth daily.     apixaban  (ELIQUIS ) 5 MG TABS tablet Take 1 tablet (5 mg total) by mouth 2 (two) times daily. 180 tablet 3   bisoprolol  (ZEBETA ) 5 MG tablet Take 1 tablet (5 mg total) by mouth daily. 30 tablet 11   Budeson-Glycopyrrol-Formoterol  (BREZTRI  AEROSPHERE) 160-9-4.8 MCG/ACT AERO  Inhale 2 puffs into the lungs in the morning and at bedtime. 32.1 g 3   Cholecalciferol  (VITAMIN D3) 50 MCG (2000 UT) TABS Take 4,000 Units by mouth daily.     colchicine 0.6 MG tablet Take 0.6 mg by mouth daily as needed (for gout flares- as directed).     empagliflozin  (JARDIANCE ) 10 MG TABS tablet Take 1 tablet (10 mg total) by mouth daily. 30 tablet 6   gabapentin  (NEURONTIN ) 300 MG capsule Take 2 capsules (600 mg total) by mouth 3 (three) times daily. for neuropathy (Patient taking differently: Take 600 mg by mouth See admin instructions. Take 600 mg by mouth in the morning & at bedtime and an additional  600 mg in the afternoon as needed for neuropathy) 180 capsule 3   ipratropium (ATROVENT ) 0.03 % nasal spray Place 2 sprays into both nostrils 2 (two) times daily. (Patient taking differently: Place 2 sprays into both nostrils 2 (two) times daily as needed for rhinitis.) 30 mL 5   ipratropium-albuterol  (DUONEB) 0.5-2.5 (3) MG/3ML SOLN Take 3 mLs by nebulization every 6 (six) hours as needed. (Patient taking differently: Take 3 mLs by nebulization every 6 (six) hours as needed (for shortness of breath or wheezing).) 120 mL 3   levothyroxine  (SYNTHROID , LEVOTHROID) 175 MCG tablet Take 175 mcg by mouth daily before breakfast.     Menthol -Camphor (TIGER BALM ARTHRITIS RUB EX) Apply 1 application  topically as needed (for pain).     montelukast  (SINGULAIR ) 10 MG tablet TAKE 1 TABLET(10 MG) BY MOUTH AT BEDTIME 90 tablet 3   Multiple Minerals-Vitamins (CAL-MAG-ZINC -D) TABS Take 1 tablet by mouth at bedtime.     Multiple Vitamin (MULTIVITAMIN WITH MINERALS) TABS tablet Take 1 tablet by mouth daily. 30 tablet 0   ondansetron  (ZOFRAN ) 4 MG tablet Take 1 tablet (4 mg total) by mouth every 6 (six) hours. (Patient taking differently: Take 4 mg by mouth every 6 (six) hours as needed for nausea or vomiting.) 12 tablet 0   oxyCODONE -acetaminophen  (PERCOCET) 10-325 MG tablet Take 1 tablet by mouth 3 (three) times  daily as needed.     OXYGEN  Inhale 2 L/min into the lungs continuous.     pantoprazole  (PROTONIX ) 40 MG tablet Take 40 mg by mouth daily before breakfast. (Patient taking differently: Take 40 mg by mouth 2 (two) times daily.)     Polyethyl Glyc-Propyl Glyc PF (SYSTANE ULTRA PF) 0.4-0.3 % SOLN Place 2 drops into both eyes 3 (three) times daily as needed (for dryness).     polyethylene glycol powder (GLYCOLAX /MIRALAX ) 17 GM/SCOOP powder Take 17 g by mouth 2 (two) times daily. Dissolve 1 capful (17g) in 4-8 ounces of liquid and take by mouth twice daily. 238 g 0   potassium chloride  SA (KLOR-CON  M) 20 MEQ tablet Take 1 tablet (20 mEq total) by mouth 2 (two) times daily. 60 tablet 11   predniSONE  (DELTASONE ) 2.5 MG tablet Take 1 tablet (2.5 mg total) by mouth daily with breakfast. 90 tablet 3  roflumilast  (DALIRESP ) 500 MCG TABS tablet Take 1 tablet (500 mcg total) by mouth daily. 30 tablet 11   rOPINIRole  (REQUIP ) 1 MG tablet Take 1 tablet (1 mg total) by mouth in the morning and at bedtime. (Patient taking differently: Take 1 mg by mouth every morning.)     rosuvastatin  (CRESTOR ) 10 MG tablet Take 10 mg by mouth at bedtime.      senna-docusate (SENOKOT-S) 8.6-50 MG tablet Take 1 tablet by mouth 2 (two) times daily for 7 days. 14 tablet 0   spironolactone  (ALDACTONE ) 25 MG tablet Take 1 tablet (25 mg total) by mouth daily. (Patient taking differently: Take 12.5 mg by mouth daily.) 90 tablet 3   torsemide  (DEMADEX ) 20 MG tablet Take 40 mg alternating with 20 mg daily. (Patient taking differently: Take 20-40 mg by mouth See admin instructions. Take 20 mg by mouth in the morning, ALTERNATING WITH 40 mg EVERY OTHER DAY)     traZODone  (DESYREL ) 50 MG tablet Take 50 mg by mouth at bedtime.     No current facility-administered medications on file prior to encounter.    Wt Readings from Last 3 Encounters:  01/12/24 90.7 kg (200 lb)  01/02/24 87.8 kg (193 lb 8 oz)  12/15/23 94.3 kg (208 lb)   BP 136/74    Pulse 63   Ht 5' 11 (1.803 m)   Wt 90.7 kg (200 lb)   SpO2 90%   BMI 27.89 kg/m   PHYSICAL EXAM: General:  Chronically ill appearing. Arrived in wheelchair. Neck: JVP 8-9 Cor: Irregular rhythm. No murmur. Lungs: diminished Abdomen: soft, nontender, nondistended.  Extremities: R BKA, no edema Neuro: alert & orientedx3. Affect pleasant     [employer] Jude interrogation (personally reviewed): > 99% RV paced, no arrhythmias  ASSESSMENT & PLAN: 1. Chronic diastolic CHF with RV failure - Echo (10/17): EF 60-65%, Grade 3 DD. Mild AI, Mild MR, RV mildly dilated and severely reduced. RA mildly dilated. PA peak pressure 74 mm Hg. Concerns for restrictive cardiomyopathy. - Echo (7/19): EF 60-65% AVR ok  - TEE (6/21): EF 60% RV moderately HK septal flattening RVSP 90s - Echo (3/22): EF 55-60% RV ok. Mild MR/TR. Bioprosthetic AVR ok. Restrictive physiology. - Echo (2/23): EF 55-60%, normal RV, bioprosthetic AVR ok - Echo 2/25: EF 55-60%, G3DD (restrictive), RV moderately reduced, mod MR and TR, stable bioprosthetic AoV. - NYHA III. Dyspnea likely multifactorial (combination of COPD, deconditioning, CHF). Functional status also limited by BKA. Volume okay on exam. Continue Torsemide  40 mg alternating with 20 mg every other day. - Continue Jardiance  10 mg daily. - Continue spironolactone  25 mg daily.   - Deferred labs, Scr 1.8 and K 3.9 on labs 09/07. States he will have labs with [facility] next week.  2. Persistent Afib  - s/p AF ablation x 2/Maze s/p pacemaker - TEE-DC-CV on 10/19/19 & DCCV on 02/25/20 - Repeat AF ablation 04/19/20. Returned to AF 04/21/20.  - DC-CV 05/25/20 -> AF - DC-CV 07/11/19, Back in AFL 3/22.  - Off Tikosyn . Per EP, not a candidate for further anti-arrhythmics or procedures.  - Rate controlled with beta blocker. Continue bisoprolol  5 mg daily. - Continue Eliquis  5 mg bid. No bleeding issues.  - Hgb stable at 13 earlier this month   3. AS  - s/p AVR - aware of  need for SBE prophylaxis - Echo 2/25: AoV mean gradient 9 mmHg    4 Pulmonary HTN  - suspect pulmonary venous hypertension - RHC in 10/17 with  pulmonary venous HTN primarily. No change. - TEE (6/21) EF 60% RV moderately HK septal flattening RVSP 90s in setting of COPD flare and PAF. - Echo (3/22) RV looks better. TR insufficient to measure RVSP - Echo (2/25): PASP 78 mmHg, RV moderately reduced, moderate MR, moderate TR   5. CAD  - LHC (10/17); mild, non-obs CAD - No chest pain.   - No ASA with Eliquis  - Continue Crestor  10 mg daily   6. Chronic Respiratory Failure with hypoxia - Continues on 2L home O2 at night and with exertion   7. HTN - BP mildly elevated in clinic today. Has not taken meds today. Typically controlled. Monitor at home.  - Meds as above   8. DM2 - Contiue Jardiance .  - management per PCP   9. Valvular disease - Mod MR and TR on echo 2/25  10. CKD 3b - Right kidney is atrophied - Baseline SCr 1.5-1.8 - Continue SGLT2i - Followed by [facility], he states he will have labs next week  Follow up 4 months with Dr. Cherrie  [facility] PA-C 01/12/24

## 2024-01-16 ENCOUNTER — Ambulatory Visit (INDEPENDENT_AMBULATORY_CARE_PROVIDER_SITE_OTHER): Admitting: Student

## 2024-01-16 ENCOUNTER — Ambulatory Visit (INDEPENDENT_AMBULATORY_CARE_PROVIDER_SITE_OTHER)

## 2024-01-16 DIAGNOSIS — M79645 Pain in left finger(s): Secondary | ICD-10-CM

## 2024-01-16 DIAGNOSIS — Z8739 Personal history of other diseases of the musculoskeletal system and connective tissue: Secondary | ICD-10-CM | POA: Diagnosis not present

## 2024-01-17 NOTE — Progress Notes (Signed)
 Chief Complaint: Left finger swelling    Discussed the use of AI scribe software for clinical note transcription with the patient, who gave verbal consent to proceed.  History of Present Illness [name] is a 73 year old male with gout who presents with a swollen and painful finger. He is accompanied by [place], who brought him to the appointment.  He has swelling and significant pain in his right finger that began last week, more severe than previous episodes. He denies any recent injury. He has a history of gout, previously affecting his toes, and typically takes Allopurinol during flare-ups. There is no numbness or tingling, but movement is limited. He is right-handed, and the swelling is in his right hand.  He has a history of osteomyelitis, resulting in an amputation performed by Dr. Harden, with ongoing healing issues and a ridge at the site.      Surgical History:   None  PMH/PSH/Family History/Social History/Meds/Allergies:    Past Medical History:  Diagnosis Date   Aortic stenosis, mild    Arthritis of hand    just a little bit in both hands (03/31/2012)   Asthma    little bit (03/31/2012)   Atrial fibrillation (HCC)    dx '04; DCCV '04, placed on flecainide, failed DCCV 04/2010, flecainide stopped; s/p successful A.fib ablation 01/31/12   CHF (congestive heart failure) (HCC)    Controlled type 2 diabetes mellitus without complication, without long-term current use of insulin  (HCC) 12/13/2019   COPD (chronic obstructive pulmonary disease) (HCC)    DJD (degenerative joint disease)    Fatty liver    Fibromyalgia    GERD (gastroesophageal reflux disease)    Hyperlipidemia    Hypertension    Hypothyroidism    S/P radiation   Left atrial enlargement    LA size 45mm by echo 11/21/11   Mitral regurgitation    trivial   Obstructive sleep apnea    mild by sleep study 2013; pt stated he does not have a machine because it wasn't bad  enough for him to have one   Pacemaker 03/31/2012   Pneumonia    last time 2023   Restless leg syndrome    Second degree AV block    Splenomegaly    Past Surgical History:  Procedure Laterality Date   AMPUTATION Right 08/23/2022   Procedure: RIGHT FOOT FIFTH RAY AMPUTATION;  Surgeon: Harden Jerona GAILS, MD;  Location: [facility] OR;  Service: Orthopedics;  Laterality: Right;   AMPUTATION Right 09/04/2022   Procedure: RIGHT BELOW KNEE AMPUTATION;  Surgeon: Harden Jerona GAILS, MD;  Location: [facility] OR;  Service: Orthopedics;  Laterality: Right;   AORTIC VALVE REPLACEMENT N/A 08/22/2015   Procedure: AORTIC VALVE REPLACEMENT (AVR);  Surgeon: Maude Fleeta Ochoa, MD;  Location: [facility] OR;  Service: Open Heart Surgery;  Laterality: N/A;   ATRIAL FIBRILLATION ABLATION  01/30/2012   PVI by Dr. Kelsie   ATRIAL FIBRILLATION ABLATION N/A 01/31/2012   Procedure: ATRIAL FIBRILLATION ABLATION;  Surgeon: Lynwood Kelsie, MD;  Location: [facility] CATH LAB;  Service: Cardiovascular;  Laterality: N/A;   ATRIAL FIBRILLATION ABLATION N/A 04/19/2020   Procedure: ATRIAL FIBRILLATION ABLATION;  Surgeon: Kelsie Lynwood, MD;  Location: [facility] INVASIVE CV LAB;  Service: Cardiovascular;  Laterality: N/A;   BACK SURGERY     X 3   BIOPSY  01/01/2022   Procedure: BIOPSY;  Surgeon: Elicia Claw, MD;  Location: [facility] ENDOSCOPY;  Service: Gastroenterology;;   CARDIAC CATHETERIZATION N/A 08/09/2015   Procedure: Right/Left Heart Cath and Coronary Angiography;  Surgeon: Peter M Swaziland, MD;  Location: [facility] INVASIVE CV LAB;  Service: Cardiovascular;  Laterality: N/A;   CARDIAC CATHETERIZATION N/A 02/05/2016   Procedure: Right/Left Heart Cath and Coronary Angiography;  Surgeon: Candyce GORMAN Reek, MD;  Location: [facility] INVASIVE CV LAB;  Service: Cardiovascular;  Laterality: N/A;   CARDIAC CATHETERIZATION N/A 04/17/2016   Procedure: Right Heart Cath;  Surgeon: Toribio JONELLE Fuel, MD;  Location: [facility] INVASIVE CV LAB;  Service: Cardiovascular;  Laterality: N/A;    CARDIOVASCULAR STRESS TEST  03/12/2002   EF 48%, NO EVIDENCE OF ISCHEMIA   CARDIOVERSION  01/2012; 03/31/2012   CARDIOVERSION N/A 02/25/2012   Procedure: CARDIOVERSION;  Surgeon: Lynwood Rakers, MD;  Location: [facility] CATH LAB;  Service: Cardiovascular;  Laterality: N/A;   CARDIOVERSION N/A 03/31/2012   Procedure: CARDIOVERSION;  Surgeon: Lynwood Rakers, MD;  Location: [facility] CATH LAB;  Service: Cardiovascular;  Laterality: N/A;   CARDIOVERSION N/A 11/23/2015   Procedure: CARDIOVERSION;  Surgeon: Ezra GORMAN Shuck, MD;  Location: [facility] ENDOSCOPY;  Service: Cardiovascular;  Laterality: N/A;   CARDIOVERSION N/A 04/08/2016   Procedure: CARDIOVERSION;  Surgeon: Toribio JONELLE Fuel, MD;  Location: [facility] ENDOSCOPY;  Service: Cardiovascular;  Laterality: N/A;   CARDIOVERSION N/A 09/14/2018   Procedure: CARDIOVERSION;  Surgeon: Delford Maude BROCKS, MD;  Location: [facility] ENDOSCOPY;  Service: Cardiovascular;  Laterality: N/A;   CARDIOVERSION N/A 10/19/2019   Procedure: CARDIOVERSION;  Surgeon: Fuel Toribio JONELLE, MD;  Location: [facility] ENDOSCOPY;  Service: Cardiovascular;  Laterality: N/A;   CARDIOVERSION N/A 02/25/2020   Procedure: CARDIOVERSION;  Surgeon: Okey Vina GAILS, MD;  Location: [facility] ENDOSCOPY;  Service: Cardiovascular;  Laterality: N/A;   CARDIOVERSION N/A 05/25/2020   Procedure: CARDIOVERSION;  Surgeon: Pietro Redell GORMAN, MD;  Location: [facility] ENDOSCOPY;  Service: Cardiovascular;  Laterality: N/A;   CARDIOVERSION N/A 07/10/2020   Procedure: CARDIOVERSION;  Surgeon: Jeffrie Oneil BROCKS, MD;  Location: [facility] ENDOSCOPY;  Service: Cardiovascular;  Laterality: N/A;   CLIPPING OF ATRIAL APPENDAGE N/A 08/22/2015   Procedure: CLIPPING OF ATRIAL APPENDAGE;  Surgeon: Maude Fleeta Ochoa, MD;  Location: [facility] OR;  Service: Open Heart Surgery;  Laterality: N/A;   COLONOSCOPY WITH PROPOFOL  N/A 01/01/2022   Procedure: COLONOSCOPY WITH PROPOFOL ;  Surgeon: Elicia Claw, MD;  Location: [facility] ENDOSCOPY;  Service: Gastroenterology;  Laterality: N/A;   DOPPLER  ECHOCARDIOGRAPHY  03/11/2002   EF 70-75%   FINGER SURGERY Left    Middle finger   FINGER TENDON REPAIR  1980's   right little finger (03/31/2012)   FLEXIBLE SIGMOIDOSCOPY N/A 12/30/2021   Procedure: FLEXIBLE SIGMOIDOSCOPY;  Surgeon: Dianna Specking, MD;  Location: [facility] ENDOSCOPY;  Service: Gastroenterology;  Laterality: N/A;   INSERT / REPLACE / REMOVE PACEMAKER     [employer] Jude   IR RADIOLOGIST EVAL & MGMT  05/06/2019   IR RADIOLOGIST EVAL & MGMT  10/06/2019   IR RADIOLOGIST EVAL & MGMT  10/31/2020   IR RADIOLOGIST EVAL & MGMT  01/18/2021   IR RADIOLOGIST EVAL & MGMT  03/08/2021   IR RADIOLOGIST EVAL & MGMT  06/13/2021   KIDNEY SURGERY Left    LEFT AND RIGHT HEART CATHETERIZATION WITH CORONARY ANGIOGRAM N/A 10/27/2012   Procedure: LEFT AND RIGHT HEART CATHETERIZATION WITH CORONARY ANGIOGRAM;  Surgeon: Erick JONELLE Bergamo, MD;  Location: [facility] CATH LAB;  Service: Cardiovascular;  Laterality: N/A;   LUMBAR DISC SURGERY  1980's X2;  2000's   MAZE N/A 08/22/2015   Procedure: MAZE;  Surgeon: Maude Fleeta Ochoa, MD;  Location: [facility] OR;  Service: Open Heart Surgery;  Laterality: N/A;   PACEMAKER INSERTION  03/31/2012   STJ Accent DR pacemaker implanted by Dr Kelsie   PERMANENT PACEMAKER INSERTION N/A 03/31/2012   Procedure: PERMANENT PACEMAKER INSERTION;  Surgeon: Lynwood Kelsie, MD;  Location: [facility] CATH LAB;  Service: Cardiovascular;  Laterality: N/A;   PPM GENERATOR CHANGEOUT N/A 08/08/2020   Procedure: PPM GENERATOR CHANGEOUT;  Surgeon: Kelsie Lynwood, MD;  Location: [facility] INVASIVE CV LAB;  Service: Cardiovascular;  Laterality: N/A;   RADIOLOGY WITH ANESTHESIA Left 02/14/2021   Procedure: RADIOLOGY WITH ANESTHESIA LEFT RENAL CRYOABLATION;  Surgeon: Luverne Aran, MD;  Location: [facility] ORS;  Service: Radiology;  Laterality: Left;   STUMP REVISION Right 10/09/2022   Procedure: REVISION RIGHT BELOW KNEE AMPUTATION;  Surgeon: Harden Jerona GAILS, MD;  Location: [facility] OR;  Service: Orthopedics;  Laterality: Right;   STUMP  REVISION Right 11/16/2022   Procedure: REVISION RIGHT BELOW KNEE AMPUTATION;  Surgeon: Harden Jerona GAILS, MD;  Location: [facility] OR;  Service: Orthopedics;  Laterality: Right;   TEE WITHOUT CARDIOVERSION  01/30/2012   Procedure: TRANSESOPHAGEAL ECHOCARDIOGRAM (TEE);  Surgeon: Ezra GORMAN Shuck, MD;  Location: [facility] ENDOSCOPY;  Service: Cardiovascular;  Laterality: N/A;  ablation next day   TEE WITHOUT CARDIOVERSION N/A 08/22/2015   Procedure: TRANSESOPHAGEAL ECHOCARDIOGRAM (TEE);  Surgeon: Maude Fleeta Ochoa, MD;  Location: [facility] OR;  Service: Open Heart Surgery;  Laterality: N/A;   TEE WITHOUT CARDIOVERSION N/A 10/19/2019   Procedure: TRANSESOPHAGEAL ECHOCARDIOGRAM (TEE);  Surgeon: Cherrie Toribio SAUNDERS, MD;  Location: [facility] ENDOSCOPY;  Service: Cardiovascular;  Laterality: N/A;   US  ECHOCARDIOGRAPHY  08/07/2009   EF 55-60%   Social History   Socioeconomic History   Marital status: Married    Spouse name: Not on file   Number of children: Not on file   Years of education: Not on file   Highest education level: Not on file  Occupational History   Occupation: retired  Tobacco Use   Smoking status: Former    Current packs/day: 0.00    Average packs/day: 1 pack/day for 30.2 years (30.2 ttl pk-yrs)    Types: Cigarettes    Start date: 1963    Quit date: 07/18/1991    Years since quitting: 32.5    Passive exposure: Never   Smokeless tobacco: Never  Vaping Use   Vaping status: Never Used  Substance and Sexual Activity   Alcohol  use: Not Currently    Comment: occasional   Drug use: Yes    Types: Marijuana    Comment: Marijuana- for nausea   Sexual activity: Not Currently  Other Topics Concern   Not on file  Social History Narrative   Lives in [place] with spouse.  Works as a Armed forces training and education officer.   Social Drivers of Corporate investment banker Strain: Not on file  Food Insecurity: No Food Insecurity (01/02/2024)   Hunger Vital Sign    Worried About Running Out of Food in the Last Year: Never true     Ran Out of Food in the Last Year: Never true  Transportation Needs: No Transportation Needs (01/02/2024)   PRAPARE - Administrator, Civil Service (Medical): No    Lack of Transportation (Non-Medical): No  Physical Activity: Not on file  Stress: Not on file  Social Connections: Moderately Integrated (01/02/2024)   Social Connection and Isolation Panel  Frequency of Communication with Friends and Family: More than three times a week    Frequency of Social Gatherings with Friends and Family: More than three times a week    Attends Religious Services: 1 to 4 times per year    Active Member of [employer] or Organizations: No    Attends Engineer, structural: Never    Marital Status: Married   Family History  Problem Relation Age of Onset   Heart failure Mother    Stroke Father    Neuropathy Neg Hx    Allergies  Allergen Reactions   Cephalexin  Rash and Other (See Comments)    Broke out all over   Meloxicam Rash and Other (See Comments)    Red Man Syndrome, also   Vancomycin  Other (See Comments)    Red Man's syndrome 09/02/15, resolved with diphenhydramine  and slowing of rate   Current Outpatient Medications  Medication Sig Dispense Refill   acetaminophen  (TYLENOL ) 325 MG tablet Take 2 tablets (650 mg total) by mouth every 6 (six) hours as needed. (Patient taking differently: Take 650 mg by mouth every 6 (six) hours as needed for mild pain (pain score 1-3) (or headaches).)     albuterol  (VENTOLIN  HFA) 108 (90 Base) MCG/ACT inhaler Inhale 2 puffs into the lungs every 6 (six) hours as needed for wheezing or shortness of breath. 1 each 5   allopurinol (ZYLOPRIM) 300 MG tablet Take 300 mg by mouth daily.     apixaban  (ELIQUIS ) 5 MG TABS tablet Take 1 tablet (5 mg total) by mouth 2 (two) times daily. 180 tablet 3   bisoprolol  (ZEBETA ) 5 MG tablet Take 1 tablet (5 mg total) by mouth daily. 30 tablet 11   Budeson-Glycopyrrol-Formoterol  (BREZTRI  AEROSPHERE) 160-9-4.8 MCG/ACT  AERO Inhale 2 puffs into the lungs in the morning and at bedtime. 32.1 g 3   Cholecalciferol  (VITAMIN D3) 50 MCG (2000 UT) TABS Take 4,000 Units by mouth daily.     colchicine 0.6 MG tablet Take 0.6 mg by mouth daily as needed (for gout flares- as directed).     empagliflozin  (JARDIANCE ) 10 MG TABS tablet Take 1 tablet (10 mg total) by mouth daily. 30 tablet 6   gabapentin  (NEURONTIN ) 300 MG capsule Take 2 capsules (600 mg total) by mouth 3 (three) times daily. for neuropathy (Patient taking differently: Take 600 mg by mouth See admin instructions. Take 600 mg by mouth in the morning & at bedtime and an additional  600 mg in the afternoon as needed for neuropathy) 180 capsule 3   ipratropium (ATROVENT ) 0.03 % nasal spray Place 2 sprays into both nostrils 2 (two) times daily. (Patient taking differently: Place 2 sprays into both nostrils 2 (two) times daily as needed for rhinitis.) 30 mL 5   ipratropium-albuterol  (DUONEB) 0.5-2.5 (3) MG/3ML SOLN Take 3 mLs by nebulization every 6 (six) hours as needed. (Patient taking differently: Take 3 mLs by nebulization every 6 (six) hours as needed (for shortness of breath or wheezing).) 120 mL 3   levothyroxine  (SYNTHROID , LEVOTHROID) 175 MCG tablet Take 175 mcg by mouth daily before breakfast.     Menthol -Camphor (TIGER BALM ARTHRITIS RUB EX) Apply 1 application  topically as needed (for pain).     montelukast  (SINGULAIR ) 10 MG tablet TAKE 1 TABLET(10 MG) BY MOUTH AT BEDTIME 90 tablet 3   Multiple Minerals-Vitamins (CAL-MAG-ZINC -D) TABS Take 1 tablet by mouth at bedtime.     Multiple Vitamin (MULTIVITAMIN WITH MINERALS) TABS tablet Take 1 tablet by mouth daily.  30 tablet 0   ondansetron  (ZOFRAN ) 4 MG tablet Take 1 tablet (4 mg total) by mouth every 6 (six) hours. (Patient taking differently: Take 4 mg by mouth every 6 (six) hours as needed for nausea or vomiting.) 12 tablet 0   oxyCODONE -acetaminophen  (PERCOCET) 10-325 MG tablet Take 1 tablet by mouth 3 (three)  times daily as needed.     OXYGEN  Inhale 2 L/min into the lungs continuous.     pantoprazole  (PROTONIX ) 40 MG tablet Take 40 mg by mouth daily before breakfast. (Patient taking differently: Take 40 mg by mouth 2 (two) times daily.)     Polyethyl Glyc-Propyl Glyc PF (SYSTANE ULTRA PF) 0.4-0.3 % SOLN Place 2 drops into both eyes 3 (three) times daily as needed (for dryness).     polyethylene glycol powder (GLYCOLAX /MIRALAX ) 17 GM/SCOOP powder Take 17 g by mouth 2 (two) times daily. Dissolve 1 capful (17g) in 4-8 ounces of liquid and take by mouth twice daily. 238 g 0   potassium chloride  SA (KLOR-CON  M) 20 MEQ tablet Take 1 tablet (20 mEq total) by mouth 2 (two) times daily. 60 tablet 11   predniSONE  (DELTASONE ) 2.5 MG tablet Take 1 tablet (2.5 mg total) by mouth daily with breakfast. 90 tablet 3   roflumilast  (DALIRESP ) 500 MCG TABS tablet Take 1 tablet (500 mcg total) by mouth daily. 30 tablet 11   rOPINIRole  (REQUIP ) 1 MG tablet Take 1 tablet (1 mg total) by mouth in the morning and at bedtime. (Patient taking differently: Take 1 mg by mouth every morning.)     rosuvastatin  (CRESTOR ) 10 MG tablet Take 10 mg by mouth at bedtime.      spironolactone  (ALDACTONE ) 25 MG tablet Take 1 tablet (25 mg total) by mouth daily. (Patient taking differently: Take 12.5 mg by mouth daily.) 90 tablet 3   torsemide  (DEMADEX ) 20 MG tablet Take 40 mg alternating with 20 mg daily. (Patient taking differently: Take 20-40 mg by mouth See admin instructions. Take 20 mg by mouth in the morning, ALTERNATING WITH 40 mg EVERY OTHER DAY)     traZODone  (DESYREL ) 50 MG tablet Take 50 mg by mouth at bedtime.     No current facility-administered medications for this visit.   No results found.  Review of Systems:   A ROS was performed including pertinent positives and negatives as documented in the HPI.  Physical Exam :   Constitutional: NAD and appears stated age Neurological: Alert and oriented Psych: Appropriate affect and  cooperative There were no vitals taken for this visit.   Comprehensive Musculoskeletal Exam:    Inspection of the left index finger demonstrates significant swelling over the PIP joint.  This area is tender and soft.  No presence of erythema or warmth.  Flexion and extension is intact, however range of motion is limited.  Distal perfusion and sensation intact.  Imaging:   Xray (left index finger 3 views): Moderate to severe arthritis within the PIP joint without evidence of fracture or dislocation.  Large amount of surrounding soft tissue swelling at the PIP.   I personally reviewed and interpreted the radiographs.      Assessment & Plan Left finger pain and swelling with underlying arthritis   He presents with acute swelling and pain, limiting range of motion. X-ray indicates arthritis with possible erosions, suggesting gouty arthritis. Differential diagnosis includes gouty arthritis and potential acute versus chronic changes. Osteomyelitis is considered, but the focus is on ruling out acute exacerbations or complications. Consult with hand specialist,  Dr. Erwin, to review x-rays and provide management input. Monitor for signs of worsening over the weekend.     I personally saw and evaluated the patient, and participated in the management and treatment plan.  Leonce Reveal, PA-C Orthopedics

## 2024-01-26 ENCOUNTER — Ambulatory Visit: Admitting: Pulmonary Disease

## 2024-02-02 ENCOUNTER — Other Ambulatory Visit (HOSPITAL_COMMUNITY): Payer: Self-pay

## 2024-02-02 MED ORDER — EMPAGLIFLOZIN 10 MG PO TABS: 10.0000 mg | ORAL_TABLET | Freq: Every day | ORAL | 6 refills | Status: AC

## 2024-02-05 ENCOUNTER — Ambulatory Visit (INDEPENDENT_AMBULATORY_CARE_PROVIDER_SITE_OTHER): Payer: Medicare Other

## 2024-02-05 DIAGNOSIS — I5032 Chronic diastolic (congestive) heart failure: Secondary | ICD-10-CM

## 2024-02-05 LAB — CUP PACEART REMOTE DEVICE CHECK
Battery Remaining Longevity: 95 mo
Battery Remaining Percentage: 72 %
Battery Voltage: 3.01 V
Brady Statistic RV Percent Paced: 99 %
Date Time Interrogation Session: 20251009054431
Implantable Lead Connection Status: 753985
Implantable Lead Connection Status: 753985
Implantable Lead Implant Date: 20131203
Implantable Lead Implant Date: 20131203
Implantable Lead Location: 753859
Implantable Lead Location: 753860
Implantable Lead Model: 1948
Implantable Pulse Generator Implant Date: 20220412
Lead Channel Impedance Value: 540 Ohm
Lead Channel Pacing Threshold Amplitude: 1.25 V
Lead Channel Pacing Threshold Pulse Width: 0.5 ms
Lead Channel Sensing Intrinsic Amplitude: 12 mV
Lead Channel Setting Pacing Amplitude: 1.5 V
Lead Channel Setting Pacing Pulse Width: 0.5 ms
Lead Channel Setting Sensing Sensitivity: 4 mV
Pulse Gen Model: 2272
Pulse Gen Serial Number: 3919039

## 2024-02-05 NOTE — Progress Notes (Signed)
 Remote PPM Transmission

## 2024-02-10 NOTE — Progress Notes (Signed)
 Remote PPM Transmission

## 2024-02-12 ENCOUNTER — Ambulatory Visit: Payer: Self-pay | Admitting: Cardiovascular Disease

## 2024-02-19 ENCOUNTER — Other Ambulatory Visit: Payer: Self-pay | Admitting: Interventional Radiology

## 2024-02-19 DIAGNOSIS — C642 Malignant neoplasm of left kidney, except renal pelvis: Secondary | ICD-10-CM

## 2024-02-24 ENCOUNTER — Other Ambulatory Visit (HOSPITAL_COMMUNITY): Payer: Self-pay | Admitting: Internal Medicine

## 2024-02-28 ENCOUNTER — Other Ambulatory Visit (HOSPITAL_COMMUNITY): Payer: Self-pay | Admitting: Cardiology

## 2024-03-01 ENCOUNTER — Encounter: Payer: Self-pay | Admitting: Radiology

## 2024-03-16 ENCOUNTER — Emergency Department
Admit: 2024-03-16 | Discharge: 2024-03-16 | Disposition: A | Discharge status: Home / Self Care | Attending: Interventional Radiology | Admitting: Interventional Radiology

## 2024-03-16 ENCOUNTER — Emergency Department (HOSPITAL_COMMUNITY)

## 2024-03-16 ENCOUNTER — Emergency Department

## 2024-03-16 ENCOUNTER — Emergency Department (EMERGENCY_DEPARTMENT_HOSPITAL): Admit: 2024-03-16 | Discharge: 2024-03-16 | Disposition: A | Discharge status: Home / Self Care

## 2024-03-16 ENCOUNTER — Encounter (HOSPITAL_COMMUNITY): Payer: Self-pay

## 2024-03-16 ENCOUNTER — Emergency Department (HOSPITAL_COMMUNITY)
Admission: EM | Admit: 2024-03-16 | Discharge: 2024-03-16 | Disposition: A | Discharge status: Home / Self Care | Attending: Emergency Medicine | Admitting: Emergency Medicine

## 2024-03-16 ENCOUNTER — Other Ambulatory Visit: Payer: Self-pay

## 2024-03-16 DIAGNOSIS — M79605 Pain in left leg: Secondary | ICD-10-CM | POA: Insufficient documentation

## 2024-03-16 DIAGNOSIS — Z79899 Other long term (current) drug therapy: Secondary | ICD-10-CM | POA: Diagnosis not present

## 2024-03-16 DIAGNOSIS — L538 Other specified erythematous conditions: Secondary | ICD-10-CM

## 2024-03-16 DIAGNOSIS — E039 Hypothyroidism, unspecified: Secondary | ICD-10-CM | POA: Insufficient documentation

## 2024-03-16 DIAGNOSIS — R062 Wheezing: Secondary | ICD-10-CM | POA: Insufficient documentation

## 2024-03-16 DIAGNOSIS — I110 Hypertensive heart disease with heart failure: Secondary | ICD-10-CM | POA: Insufficient documentation

## 2024-03-16 DIAGNOSIS — I5030 Unspecified diastolic (congestive) heart failure: Secondary | ICD-10-CM | POA: Diagnosis not present

## 2024-03-16 DIAGNOSIS — J449 Chronic obstructive pulmonary disease, unspecified: Secondary | ICD-10-CM | POA: Diagnosis not present

## 2024-03-16 DIAGNOSIS — R52 Pain, unspecified: Secondary | ICD-10-CM | POA: Diagnosis not present

## 2024-03-16 DIAGNOSIS — Z950 Presence of cardiac pacemaker: Secondary | ICD-10-CM | POA: Diagnosis not present

## 2024-03-16 DIAGNOSIS — E119 Type 2 diabetes mellitus without complications: Secondary | ICD-10-CM | POA: Insufficient documentation

## 2024-03-16 DIAGNOSIS — C642 Malignant neoplasm of left kidney, except renal pelvis: Secondary | ICD-10-CM

## 2024-03-16 DIAGNOSIS — I482 Chronic atrial fibrillation, unspecified: Secondary | ICD-10-CM | POA: Diagnosis not present

## 2024-03-16 DIAGNOSIS — Z7901 Long term (current) use of anticoagulants: Secondary | ICD-10-CM | POA: Diagnosis not present

## 2024-03-16 DIAGNOSIS — Z85528 Personal history of other malignant neoplasm of kidney: Secondary | ICD-10-CM | POA: Diagnosis not present

## 2024-03-16 DIAGNOSIS — Z7984 Long term (current) use of oral hypoglycemic drugs: Secondary | ICD-10-CM | POA: Diagnosis not present

## 2024-03-16 LAB — CBC WITH DIFFERENTIAL/PLATELET
Abs Immature Granulocytes: 0.05 K/uL (ref 0.00–0.07)
Basophils Absolute: 0 K/uL (ref 0.0–0.1)
Basophils Relative: 1 %
Eosinophils Absolute: 0.2 K/uL (ref 0.0–0.5)
Eosinophils Relative: 2 %
HCT: 39 % (ref 39.0–52.0)
Hemoglobin: 12.6 g/dL — ABNORMAL LOW (ref 13.0–17.0)
Immature Granulocytes: 1 %
Lymphocytes Relative: 14 %
Lymphs Abs: 1.1 K/uL (ref 0.7–4.0)
MCH: 31.2 pg (ref 26.0–34.0)
MCHC: 32.3 g/dL (ref 30.0–36.0)
MCV: 96.5 fL (ref 80.0–100.0)
Monocytes Absolute: 0.5 K/uL (ref 0.1–1.0)
Monocytes Relative: 6 %
Neutro Abs: 6.3 K/uL (ref 1.7–7.7)
Neutrophils Relative %: 76 %
Platelets: 132 K/uL — ABNORMAL LOW (ref 150–400)
RBC: 4.04 MIL/uL — ABNORMAL LOW (ref 4.22–5.81)
RDW: 17.6 % — ABNORMAL HIGH (ref 11.5–15.5)
WBC: 8.2 K/uL (ref 4.0–10.5)
nRBC: 0 % (ref 0.0–0.2)

## 2024-03-16 LAB — COMPREHENSIVE METABOLIC PANEL WITH GFR
ALT: 80 U/L — ABNORMAL HIGH (ref 0–44)
AST: 113 U/L — ABNORMAL HIGH (ref 15–41)
Albumin: 4.7 g/dL (ref 3.5–5.0)
Alkaline Phosphatase: 99 U/L (ref 38–126)
Anion gap: 12 (ref 5–15)
BUN: 39 mg/dL — ABNORMAL HIGH (ref 8–23)
CO2: 33 mmol/L — ABNORMAL HIGH (ref 22–32)
Calcium: 9.9 mg/dL (ref 8.9–10.3)
Chloride: 95 mmol/L — ABNORMAL LOW (ref 98–111)
Creatinine, Ser: 1.89 mg/dL — ABNORMAL HIGH (ref 0.61–1.24)
GFR, Estimated: 37 mL/min — ABNORMAL LOW (ref >60)
Glucose, Bld: 112 mg/dL — ABNORMAL HIGH (ref 70–99)
Potassium: 4.1 mmol/L (ref 3.5–5.1)
Sodium: 140 mmol/L (ref 135–145)
Total Bilirubin: 0.4 mg/dL (ref 0.0–1.2)
Total Protein: 7.9 g/dL (ref 6.5–8.1)

## 2024-03-16 LAB — RESP PANEL BY RT-PCR (RSV, FLU A&B, COVID)  RVPGX2
Influenza A by PCR: NEGATIVE
Influenza B by PCR: NEGATIVE
Resp Syncytial Virus by PCR: NEGATIVE
SARS Coronavirus 2 by RT PCR: NEGATIVE

## 2024-03-16 LAB — D-DIMER, QUANTITATIVE: D-Dimer, Quant: <0.27 ug{FEU}/mL (ref 0.00–0.50)

## 2024-03-16 MED ORDER — OXYCODONE HCL 5 MG PO TABS
5.0000 mg | ORAL_TABLET | Freq: Once | ORAL | Status: AC
  Administered 2024-03-16: 5 mg via ORAL
  Filled 2024-03-16: qty 1

## 2024-03-16 MED ORDER — IPRATROPIUM-ALBUTEROL 0.5-2.5 (3) MG/3ML IN SOLN
3.0000 mL | Freq: Once | RESPIRATORY_TRACT | Status: AC
  Administered 2024-03-16: 3 mL via RESPIRATORY_TRACT
  Filled 2024-03-16: qty 3

## 2024-03-16 MED ORDER — LIDOCAINE 5 % EX PTCH
1.0000 | MEDICATED_PATCH | CUTANEOUS | Status: DC
  Administered 2024-03-16: 1 via TRANSDERMAL
  Filled 2024-03-16: qty 1

## 2024-03-16 MED ORDER — OXYCODONE-ACETAMINOPHEN 5-325 MG PO TABS
1.0000 | ORAL_TABLET | Freq: Once | ORAL | Status: AC
  Administered 2024-03-16: 1 via ORAL
  Filled 2024-03-16: qty 1

## 2024-03-16 MED ORDER — CYCLOBENZAPRINE HCL 10 MG PO TABS
5.0000 mg | ORAL_TABLET | Freq: Once | ORAL | Status: AC
  Administered 2024-03-16: 5 mg via ORAL
  Filled 2024-03-16: qty 1

## 2024-03-16 MED ORDER — OXYCODONE-ACETAMINOPHEN 10-325 MG PO TABS: 1.0000 | ORAL_TABLET | Freq: Once | ORAL | Status: DC

## 2024-03-16 MED ORDER — FENTANYL CITRATE (PF) 50 MCG/ML IJ SOSY
50.0000 ug | PREFILLED_SYRINGE | Freq: Once | INTRAMUSCULAR | Status: AC
  Administered 2024-03-16: 50 ug via INTRAVENOUS
  Filled 2024-03-16: qty 1

## 2024-03-16 MED ORDER — HYDROMORPHONE HCL 1 MG/ML IJ SOLN
0.5000 mg | Freq: Once | INTRAMUSCULAR | Status: AC
  Administered 2024-03-16: 0.5 mg via INTRAVENOUS
  Filled 2024-03-16: qty 1

## 2024-03-16 MED ORDER — METHYLPREDNISOLONE SODIUM SUCC 125 MG IJ SOLR
125.0000 mg | Freq: Once | INTRAMUSCULAR | Status: AC
  Administered 2024-03-16: 125 mg via INTRAVENOUS
  Filled 2024-03-16: qty 2

## 2024-03-16 NOTE — Medical Student Note (Incomplete)
 [facility]-EMERGENCY DEPT Provider Student Note For educational purposes for Medical, PA and NP students only and not part of the legal medical record.   CSN: [id number] Arrival date & time: 03/16/24  0210      History   Chief Complaint Chief Complaint  Patient presents with   Leg Pain    HPI JUN OSMENT is a 73 y.o. male with PMH of history of aortic stenosis, status post aortic valve replacement, mitral regurgitation, asthma, chronic A-fib, HFpEF, COPD, hypertension, hyperlipidemia, hypothyroidism, left renal cell carcinoma, OSA not on CPAP, secondary AV block status post pacemaker placement.    Leg Pain   Past Medical History:  Diagnosis Date   Aortic stenosis, mild    Arthritis of hand    just a little bit in both hands (03/31/2012)   Asthma    little bit (03/31/2012)   Atrial fibrillation (HCC)    dx '04; DCCV '04, placed on flecainide, failed DCCV 04/2010, flecainide stopped; s/p successful A.fib ablation 01/31/12   CHF (congestive heart failure) (HCC)    Controlled type 2 diabetes mellitus without complication, without long-term current use of insulin  (HCC) 12/13/2019   COPD (chronic obstructive pulmonary disease) (HCC)    DJD (degenerative joint disease)    Fatty liver    Fibromyalgia    GERD (gastroesophageal reflux disease)    Hyperlipidemia    Hypertension    Hypothyroidism    S/P radiation   Left atrial enlargement    LA size 45mm by echo 11/21/11   Mitral regurgitation    trivial   Obstructive sleep apnea    mild by sleep study 2013; pt stated he does not have a machine because it wasn't bad enough for him to have one   Pacemaker 03/31/2012   Pneumonia    last time 2023   Restless leg syndrome    Second degree AV block    Splenomegaly     Patient Active Problem List   Diagnosis Date Noted   Intractable abdominal pain 01/02/2024   Cervical radiculopathy 01/02/2024   Spinal stenosis in cervical region 01/02/2024   Gouty arthritis 01/02/2024    Morbid obesity (HCC) 01/02/2024   Renal cell carcinoma of left kidney (HCC) 01/02/2024   Intractable nausea 01/02/2024   AKI (acute kidney injury) 12/22/2022   Fall 11/14/2022   Pain of right lower extremity due to injury 11/14/2022   Diabetic ulcer of lower leg (HCC) 11/13/2022   Osteomyelitis (HCC) 11/13/2022   Dyslipidemia 10/18/2022   Cellulitis of right lower extremity 10/17/2022   Dehiscence of amputation stump of right lower extremity (HCC) 10/09/2022   Below-knee amputation of right lower extremity (HCC) 10/09/2022   Gangrene of right foot (HCC) 09/04/2022   History of below-knee amputation of right lower extremity (HCC) 09/04/2022   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) 08/23/2022   CAP (community acquired pneumonia) 12/28/2021   Constipation 12/28/2021   Pain in both lower extremities 07/03/2021   Peripheral neuropathy 07/03/2021   Acute metabolic encephalopathy 06/09/2021   Marijuana dependence (HCC) 06/09/2021   DNR (do not resuscitate) 06/09/2021   Chest pain 06/08/2021   Renal lesion 02/14/2021   Status post cryoablation 02/14/2021   Allergic rhinitis 12/08/2020   Preop pulmonary/respiratory exam 03/27/2020   Acute on chronic diastolic (congestive) heart failure (HCC) 01/22/2020   Abdominal pain 01/22/2020   Hypothyroidism 12/13/2019   DM2 (diabetes mellitus, type 2) (HCC) 12/13/2019   Chronic kidney disease, stage 3b (HCC) 12/13/2019   Acute bacterial  bronchitis 12/13/2019   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   Typical atrial flutter (HCC)    Chronic respiratory failure with hypoxia (HCC) 05/23/2016   CAD (coronary artery disease) 05/23/2016   Persistent atrial fibrillation (HCC)    Atypical atrial flutter (HCC)    Visit for monitoring Tikosyn  therapy 04/16/2016   Chronic diastolic CHF (congestive heart failure) (HCC) 02/03/2016   Cough 12/06/2015   Acute on chronic respiratory failure with hypoxia (HCC) 09/08/2015   Hallucinations 09/04/2015    Lymphadenopathy 09/04/2015   Ascending aortic aneurysm 09/04/2015   Hypoxia 09/02/2015   S/P aortic valve replacement with bioprosthetic valve 09/02/2015   Pleural effusion 09/02/2015   S/P AVR 08/22/2015   Umbilical hernia 12/12/2014   GERD without esophagitis 09/20/2014   Fibromyalgia 06/14/2014   Restless legs syndrome 06/14/2014   Intrinsic asthma 10/13/2013   Dyspnea 09/15/2013   Pulmonary hypertension (HCC) 09/15/2013   Shortness of breath 05/05/2013   Pacemaker-St.Jude 04/01/2012   Bradycardia 03/31/2012   Second degree AV block 03/31/2012   AV block, 1st degree 02/01/2012   PAF (paroxysmal atrial fibrillation) (HCC) 11/13/2011   Fatigue 11/13/2011   Essential hypertension 11/13/2011   Aortic stenosis 11/13/2011   Sleep apnea 11/13/2011    Past Surgical History:  Procedure Laterality Date   AMPUTATION Right 08/23/2022   Procedure: RIGHT FOOT FIFTH RAY AMPUTATION;  Surgeon: Harden Jerona GAILS, MD;  Location: [facility] OR;  Service: Orthopedics;  Laterality: Right;   AMPUTATION Right 09/04/2022   Procedure: RIGHT BELOW KNEE AMPUTATION;  Surgeon: Harden Jerona GAILS, MD;  Location: [facility] OR;  Service: Orthopedics;  Laterality: Right;   AORTIC VALVE REPLACEMENT N/A 08/22/2015   Procedure: AORTIC VALVE REPLACEMENT (AVR);  Surgeon: Maude Fleeta Ochoa, MD;  Location: [facility] OR;  Service: Open Heart Surgery;  Laterality: N/A;   ATRIAL FIBRILLATION ABLATION  01/30/2012   PVI by Dr. Kelsie   ATRIAL FIBRILLATION ABLATION N/A 01/31/2012   Procedure: ATRIAL FIBRILLATION ABLATION;  Surgeon: Lynwood Kelsie, MD;  Location: [facility] CATH LAB;  Service: Cardiovascular;  Laterality: N/A;   ATRIAL FIBRILLATION ABLATION N/A 04/19/2020   Procedure: ATRIAL FIBRILLATION ABLATION;  Surgeon: Kelsie Lynwood, MD;  Location: [facility] INVASIVE CV LAB;  Service: Cardiovascular;  Laterality: N/A;   BACK SURGERY     X 3   BIOPSY  01/01/2022   Procedure: BIOPSY;  Surgeon: Elicia Claw, MD;  Location: [facility] ENDOSCOPY;  Service:  Gastroenterology;;   CARDIAC CATHETERIZATION N/A 08/09/2015   Procedure: Right/Left Heart Cath and Coronary Angiography;  Surgeon: Peter M Jordan, MD;  Location: [facility] INVASIVE CV LAB;  Service: Cardiovascular;  Laterality: N/A;   CARDIAC CATHETERIZATION N/A 02/05/2016   Procedure: Right/Left Heart Cath and Coronary Angiography;  Surgeon: Candyce GORMAN Reek, MD;  Location: [facility] INVASIVE CV LAB;  Service: Cardiovascular;  Laterality: N/A;   CARDIAC CATHETERIZATION N/A 04/17/2016   Procedure: Right Heart Cath;  Surgeon: Toribio JONELLE Fuel, MD;  Location: [facility] INVASIVE CV LAB;  Service: Cardiovascular;  Laterality: N/A;   CARDIOVASCULAR STRESS TEST  03/12/2002   EF 48%, NO EVIDENCE OF ISCHEMIA   CARDIOVERSION  01/2012; 03/31/2012   CARDIOVERSION N/A 02/25/2012   Procedure: CARDIOVERSION;  Surgeon: Lynwood Kelsie, MD;  Location: [facility] CATH LAB;  Service: Cardiovascular;  Laterality: N/A;   CARDIOVERSION N/A 03/31/2012   Procedure: CARDIOVERSION;  Surgeon: Lynwood Kelsie, MD;  Location: [facility] CATH LAB;  Service: Cardiovascular;  Laterality: N/A;   CARDIOVERSION N/A 11/23/2015   Procedure: CARDIOVERSION;  Surgeon: Ezra GORMAN Shuck, MD;  Location: [facility] ENDOSCOPY;  Service:  Cardiovascular;  Laterality: N/A;   CARDIOVERSION N/A 04/08/2016   Procedure: CARDIOVERSION;  Surgeon: Toribio JONELLE Fuel, MD;  Location: [facility] ENDOSCOPY;  Service: Cardiovascular;  Laterality: N/A;   CARDIOVERSION N/A 09/14/2018   Procedure: CARDIOVERSION;  Surgeon: Delford Maude BROCKS, MD;  Location: [facility] ENDOSCOPY;  Service: Cardiovascular;  Laterality: N/A;   CARDIOVERSION N/A 10/19/2019   Procedure: CARDIOVERSION;  Surgeon: Fuel Toribio JONELLE, MD;  Location: [facility] ENDOSCOPY;  Service: Cardiovascular;  Laterality: N/A;   CARDIOVERSION N/A 02/25/2020   Procedure: CARDIOVERSION;  Surgeon: Okey Vina GAILS, MD;  Location: [facility] ENDOSCOPY;  Service: Cardiovascular;  Laterality: N/A;   CARDIOVERSION N/A 05/25/2020   Procedure: CARDIOVERSION;  Surgeon: Pietro Redell RAMAN, MD;  Location: [facility] ENDOSCOPY;  Service: Cardiovascular;  Laterality: N/A;   CARDIOVERSION N/A 07/10/2020   Procedure: CARDIOVERSION;  Surgeon: Jeffrie Oneil BROCKS, MD;  Location: [facility] ENDOSCOPY;  Service: Cardiovascular;  Laterality: N/A;   CLIPPING OF ATRIAL APPENDAGE N/A 08/22/2015   Procedure: CLIPPING OF ATRIAL APPENDAGE;  Surgeon: Maude Fleeta Ochoa, MD;  Location: [facility] OR;  Service: Open Heart Surgery;  Laterality: N/A;   COLONOSCOPY WITH PROPOFOL  N/A 01/01/2022   Procedure: COLONOSCOPY WITH PROPOFOL ;  Surgeon: Elicia Claw, MD;  Location: [facility] ENDOSCOPY;  Service: Gastroenterology;  Laterality: N/A;   DOPPLER ECHOCARDIOGRAPHY  03/11/2002   EF 70-75%   FINGER SURGERY Left    Middle finger   FINGER TENDON REPAIR  1980's   right little finger (03/31/2012)   FLEXIBLE SIGMOIDOSCOPY N/A 12/30/2021   Procedure: FLEXIBLE SIGMOIDOSCOPY;  Surgeon: Dianna Specking, MD;  Location: [facility] ENDOSCOPY;  Service: Gastroenterology;  Laterality: N/A;   INSERT / REPLACE / REMOVE PACEMAKER     St Jude   IR RADIOLOGIST EVAL & MGMT  05/06/2019   IR RADIOLOGIST EVAL & MGMT  10/06/2019   IR RADIOLOGIST EVAL & MGMT  10/31/2020   IR RADIOLOGIST EVAL & MGMT  01/18/2021   IR RADIOLOGIST EVAL & MGMT  03/08/2021   IR RADIOLOGIST EVAL & MGMT  06/13/2021   KIDNEY SURGERY Left    LEFT AND RIGHT HEART CATHETERIZATION WITH CORONARY ANGIOGRAM N/A 10/27/2012   Procedure: LEFT AND RIGHT HEART CATHETERIZATION WITH CORONARY ANGIOGRAM;  Surgeon: Erick JONELLE Bergamo, MD;  Location: [facility] CATH LAB;  Service: Cardiovascular;  Laterality: N/A;   LUMBAR DISC SURGERY  1980's X2;  2000's   MAZE N/A 08/22/2015   Procedure: MAZE;  Surgeon: Maude Fleeta Ochoa, MD;  Location: [facility] OR;  Service: Open Heart Surgery;  Laterality: N/A;   PACEMAKER INSERTION  03/31/2012   STJ Accent DR pacemaker implanted by Dr Kelsie   PERMANENT PACEMAKER INSERTION N/A 03/31/2012   Procedure: PERMANENT PACEMAKER INSERTION;  Surgeon: Lynwood Kelsie, MD;  Location: [facility] CATH  LAB;  Service: Cardiovascular;  Laterality: N/A;   PPM GENERATOR CHANGEOUT N/A 08/08/2020   Procedure: PPM GENERATOR CHANGEOUT;  Surgeon: Kelsie Lynwood, MD;  Location: [facility] INVASIVE CV LAB;  Service: Cardiovascular;  Laterality: N/A;   RADIOLOGY WITH ANESTHESIA Left 02/14/2021   Procedure: RADIOLOGY WITH ANESTHESIA LEFT RENAL CRYOABLATION;  Surgeon: Luverne Aran, MD;  Location: [facility] ORS;  Service: Radiology;  Laterality: Left;   STUMP REVISION Right 10/09/2022   Procedure: REVISION RIGHT BELOW KNEE AMPUTATION;  Surgeon: Harden Jerona GAILS, MD;  Location: [facility] OR;  Service: Orthopedics;  Laterality: Right;   STUMP REVISION Right 11/16/2022   Procedure: REVISION RIGHT BELOW KNEE AMPUTATION;  Surgeon: Harden Jerona GAILS, MD;  Location: [facility] OR;  Service: Orthopedics;  Laterality: Right;   TEE WITHOUT CARDIOVERSION  01/30/2012  Procedure: TRANSESOPHAGEAL ECHOCARDIOGRAM (TEE);  Surgeon: Ezra GORMAN Shuck, MD;  Location: [facility] ENDOSCOPY;  Service: Cardiovascular;  Laterality: N/A;  ablation next day   TEE WITHOUT CARDIOVERSION N/A 08/22/2015   Procedure: TRANSESOPHAGEAL ECHOCARDIOGRAM (TEE);  Surgeon: Maude Fleeta Ochoa, MD;  Location: [facility] OR;  Service: Open Heart Surgery;  Laterality: N/A;   TEE WITHOUT CARDIOVERSION N/A 10/19/2019   Procedure: TRANSESOPHAGEAL ECHOCARDIOGRAM (TEE);  Surgeon: Cherrie Toribio SAUNDERS, MD;  Location: [facility] ENDOSCOPY;  Service: Cardiovascular;  Laterality: N/A;   US  ECHOCARDIOGRAPHY  08/07/2009   EF 55-60%       Home Medications    Prior to Admission medications   Medication Sig Start Date End Date Taking? Authorizing Provider  acetaminophen  (TYLENOL ) 325 MG tablet Take 2 tablets (650 mg total) by mouth every 6 (six) hours as needed. Patient taking differently: Take 650 mg by mouth every 6 (six) hours as needed for mild pain (pain score 1-3) (or headaches). 12/24/22   Nicholas Bar, MD  albuterol  (VENTOLIN  HFA) 108 (90 Base) MCG/ACT inhaler Inhale 2 puffs into the lungs every 6 (six) hours as  needed for wheezing or shortness of breath. 05/07/23   Mannam, Praveen, MD  allopurinol (ZYLOPRIM) 300 MG tablet Take 300 mg by mouth daily.    [provider]  apixaban  (ELIQUIS ) 5 MG TABS tablet Take 1 tablet (5 mg total) by mouth 2 (two) times daily. 10/28/23   Bensimhon, Toribio SAUNDERS, MD  bisoprolol  (ZEBETA ) 5 MG tablet Take 1 tablet (5 mg total) by mouth daily. 01/06/24   Bensimhon, Toribio SAUNDERS, MD  Budeson-Glycopyrrol-Formoterol  (BREZTRI  AEROSPHERE) 160-9-4.8 MCG/ACT AERO Inhale 2 puffs into the lungs in the morning and at bedtime. 05/07/23   Mannam, Praveen, MD  Cholecalciferol  (VITAMIN D3) 50 MCG (2000 UT) TABS Take 4,000 Units by mouth daily.    [provider]  colchicine 0.6 MG tablet Take 0.6 mg by mouth daily as needed (for gout flares- as directed). 10/14/23   [provider]  empagliflozin  (JARDIANCE ) 10 MG TABS tablet Take 1 tablet (10 mg total) by mouth daily. 02/02/24   Bensimhon, Toribio SAUNDERS, MD  gabapentin  (NEURONTIN ) 300 MG capsule Take 2 capsules (600 mg total) by mouth 3 (three) times daily. for neuropathy Patient taking differently: Take 600 mg by mouth See admin instructions. Take 600 mg by mouth in the morning & at bedtime and an additional  600 mg in the afternoon as needed for neuropathy 10/19/22   Rai, Ripudeep K, MD  ipratropium (ATROVENT ) 0.03 % nasal spray Place 2 sprays into both nostrils 2 (two) times daily. Patient taking differently: Place 2 sprays into both nostrils 2 (two) times daily as needed for rhinitis. 09/26/23   Hope Almarie ORN, NP  ipratropium-albuterol  (DUONEB) 0.5-2.5 (3) MG/3ML SOLN Take 3 mLs by nebulization every 6 (six) hours as needed. Patient taking differently: Take 3 mLs by nebulization every 6 (six) hours as needed (for shortness of breath or wheezing). 05/07/23 05/06/24  Mannam, Praveen, MD  levothyroxine  (SYNTHROID , LEVOTHROID) 175 MCG tablet Take 175 mcg by mouth daily before breakfast.    [provider]  Menthol -Camphor (TIGER  BALM ARTHRITIS RUB EX) Apply 1 application  topically as needed (for pain).    [provider]  montelukast  (SINGULAIR ) 10 MG tablet TAKE 1 TABLET(10 MG) BY MOUTH AT BEDTIME 10/16/23   Shelah Lamar GORMAN, MD  Multiple Minerals-Vitamins (CAL-MAG-ZINC -D) TABS Take 1 tablet by mouth at bedtime.    [provider]  Multiple Vitamin (MULTIVITAMIN WITH MINERALS) TABS tablet Take  1 tablet by mouth daily. 12/15/19   Sherrill Cable Latif, DO  ondansetron  (ZOFRAN ) 4 MG tablet Take 1 tablet (4 mg total) by mouth every 6 (six) hours. Patient taking differently: Take 4 mg by mouth every 6 (six) hours as needed for nausea or vomiting. 01/31/23   Kingsley, Victoria K, DO  oxyCODONE -acetaminophen  (PERCOCET) 10-325 MG tablet Take 1 tablet by mouth 3 (three) times daily as needed. 12/30/23   [provider]  OXYGEN  Inhale 2 L/min into the lungs continuous.    [provider]  pantoprazole  (PROTONIX ) 40 MG tablet Take 40 mg by mouth daily before breakfast. Patient taking differently: Take 40 mg by mouth 2 (two) times daily.    [provider]  Polyethyl Glyc-Propyl Glyc PF (SYSTANE ULTRA PF) 0.4-0.3 % SOLN Place 2 drops into both eyes 3 (three) times daily as needed (for dryness).    [provider]  polyethylene glycol powder (GLYCOLAX /MIRALAX ) 17 GM/SCOOP powder Take 17 g by mouth 2 (two) times daily. Dissolve 1 capful (17g) in 4-8 ounces of liquid and take by mouth twice daily. 01/05/24   Adhikari, Amrit, MD  potassium chloride  SA (KLOR-CON  M) 20 MEQ tablet Take 1 tablet (20 mEq total) by mouth 2 (two) times daily. 12/15/23   Glena Harlene HERO, FNP  predniSONE  (DELTASONE ) 2.5 MG tablet Take 1 tablet (2.5 mg total) by mouth daily with breakfast. 05/07/23   Mannam, Praveen, MD  roflumilast  (DALIRESP ) 500 MCG TABS tablet Take 1 tablet (500 mcg total) by mouth daily. 09/26/23   Hope Almarie ORN, NP  rOPINIRole  (REQUIP ) 1 MG tablet Take 1 tablet (1 mg total) by mouth in the  morning and at bedtime. Patient taking differently: Take 1 mg by mouth every morning. 01/23/20   Cheryle Page, MD  rosuvastatin  (CRESTOR ) 10 MG tablet Take 10 mg by mouth at bedtime.  08/08/16   [provider]  spironolactone  (ALDACTONE ) 25 MG tablet Take 1 tablet (25 mg total) by mouth daily. Patient taking differently: Take 12.5 mg by mouth daily. 02/16/16   Lesia Ozell Barter, PA-C  torsemide  (DEMADEX ) 20 MG tablet Take 40 mg alternating with 20 mg daily. Patient taking differently: Take 20-40 mg by mouth See admin instructions. Take 20 mg by mouth in the morning, ALTERNATING WITH 40 mg EVERY OTHER DAY 12/24/23   Glena Harlene HERO, FNP  traZODone  (DESYREL ) 50 MG tablet Take 50 mg by mouth at bedtime.    [provider]    Family History Family History  Problem Relation Age of Onset   Heart failure Mother    Stroke Father    Neuropathy Neg Hx     Social History Social History   Tobacco Use   Smoking status: Former    Current packs/day: 0.00    Average packs/day: 1 pack/day for 30.2 years (30.2 ttl pk-yrs)    Types: Cigarettes    Start date: 1963    Quit date: 07/18/1991    Years since quitting: 32.6    Passive exposure: Never   Smokeless tobacco: Never  Vaping Use   Vaping status: Never Used  Substance Use Topics   Alcohol  use: Not Currently    Comment: occasional   Drug use: Yes    Types: Marijuana    Comment: Marijuana- for nausea     Allergies   Cephalexin , Meloxicam, and Vancomycin    Review of Systems Review of Systems   Physical Exam Updated Vital Signs BP (!) 150/91 (BP Location: Right Arm)   Pulse ROLLEN)  59   Temp 97.6 F (36.4 C) (Oral)   Resp 17   SpO2 100%   Physical Exam   ED Treatments / Results  Labs (all labs ordered are listed, but only abnormal results are displayed) Labs Reviewed  CBC WITH DIFFERENTIAL/PLATELET - Abnormal; Notable for the following components:      Result Value   RBC 4.04 (*)    Hemoglobin  12.6 (*)    RDW 17.6 (*)    Platelets 132 (*)    All other components within normal limits  COMPREHENSIVE METABOLIC PANEL WITH GFR - Abnormal; Notable for the following components:   Chloride 95 (*)    CO2 33 (*)    Glucose, Bld 112 (*)    BUN 39 (*)    Creatinine, Ser 1.89 (*)    AST 113 (*)    ALT 80 (*)    GFR, Estimated 37 (*)    All other components within normal limits    EKG  Radiology No results found.  Procedures Procedures (including critical care time)  Medications Ordered in ED Medications - No data to display   Initial Impression / Assessment and Plan / ED Course  I have reviewed the triage vital signs and the nursing notes.  Pertinent labs & imaging results that were available during my care of the patient were reviewed by me and considered in my medical decision making (see chart for details).     ***  Final Clinical Impressions(s) / ED Diagnoses   Final diagnoses:  None    New Prescriptions New Prescriptions   No medications on file

## 2024-03-16 NOTE — ED Triage Notes (Signed)
 Pt reports with left leg pain x 2 days. Pt reports not taking his eliquis  for 2 days.

## 2024-03-16 NOTE — Discharge Instructions (Addendum)
 Evaluation was overall reassuring.  Please follow with your PCP.  DVT ultrasound study was negative.  If your symptoms worsen or change anyway please return to the ED for further evaluation.

## 2024-03-16 NOTE — ED Provider Notes (Signed)
 Patient signed out to me by PA Nidia Mays.  Here for left hip pain radiates down the leg.  Concern for DVT.  At this time he stable on his 2 L of home oxygen  for COPD.  Awaiting DVT study.  Dissipate discharge if DVT study is negative. Physical Exam  BP 126/83   Pulse 60   Temp 97.8 F (36.6 C) (Oral)   Resp 16   SpO2 100%   Physical Exam  Procedures  Procedures  ED Course / MDM   Clinical Course as of 03/16/24 1158  Tue Mar 16, 2024  0445 Temp: 97.6 F (36.4 C) [SM]  [phone] Here for left hip pain radiating down the leg. Not compliant with his eliquis . Takes it for afib. On 2L at home for COPD. Pending US . If negative, can go home.  [JR]    Clinical Course User Index [JR] Kadie Balestrieri K, PA-C [SM] Mays Nidia JULIANNA DEVONNA   Medical Decision Making Amount and/or Complexity of Data Reviewed Labs: ordered. Radiology: ordered.  Risk Prescription drug management.   DVT study is negative he states that pain is improved after treatment.  Advised him to follow-up with with his PCP.  Discussed return precautions.  Discharged.       Lang Norleen POUR, PA-C 03/16/24 1200    Cottie Donnice PARAS, MD 03/16/24 [phone]

## 2024-03-16 NOTE — ED Provider Notes (Signed)
 [facility] AT [facility] Provider Note   CSN: [id number] Arrival date & time: 03/16/24  0210     Patient presents with: Leg Pain   [name] is a 73 y.o. male PMHx of asthma, chronic A-fib on Eliquis , HFpEF, COPD, hypertension, hyperlipidemia, hypothyroidism. Presenting today for atraumatic leg left leg pain 10/10 that starts at the left hip and travels all the way to the toes. Denies back pain. The pain is worst at the left thigh to knee, diffuse and evenly distributed anteriorly/posteriorly. The pain started 7 days ago, and has been constant but gradually worsening. He acknowledges recently missing some of his Eliquis  doses. No prior history of clots. He takes oxycodone  prn and last dose was 15mg  at 10pm. He denies chest pain, recent illness, dermal infections. Patient does endorse chronic SOB from his COPD which has slightly worsened recently - no increase from his baseline 2L [place].    Leg Pain      Prior to Admission medications   Medication Sig Start Date End Date Taking? Authorizing Provider  acetaminophen  (TYLENOL ) 325 MG tablet Take 2 tablets (650 mg total) by mouth every 6 (six) hours as needed. Patient taking differently: Take 650 mg by mouth every 6 (six) hours as needed for mild pain (pain score 1-3) (or headaches). 12/24/22   Nicholas Bar, MD  albuterol  (VENTOLIN  HFA) 108 (90 Base) MCG/ACT inhaler Inhale 2 puffs into the lungs every 6 (six) hours as needed for wheezing or shortness of breath. 05/07/23   Mannam, Praveen, MD  allopurinol (ZYLOPRIM) 300 MG tablet Take 300 mg by mouth daily.    [provider]  apixaban  (ELIQUIS ) 5 MG TABS tablet Take 1 tablet (5 mg total) by mouth 2 (two) times daily. 10/28/23   Bensimhon, Toribio SAUNDERS, MD  bisoprolol  (ZEBETA ) 5 MG tablet Take 1 tablet (5 mg total) by mouth daily. 01/06/24   Bensimhon, Toribio SAUNDERS, MD  Budeson-Glycopyrrol-Formoterol  (BREZTRI  AEROSPHERE) 160-9-4.8 MCG/ACT AERO Inhale 2 puffs  into the lungs in the morning and at bedtime. 05/07/23   Mannam, Praveen, MD  Cholecalciferol  (VITAMIN D3) 50 MCG (2000 UT) TABS Take 4,000 Units by mouth daily.    [provider]  colchicine 0.6 MG tablet Take 0.6 mg by mouth daily as needed (for gout flares- as directed). 10/14/23   [provider]  empagliflozin  (JARDIANCE ) 10 MG TABS tablet Take 1 tablet (10 mg total) by mouth daily. 02/02/24   Bensimhon, Toribio SAUNDERS, MD  gabapentin  (NEURONTIN ) 300 MG capsule Take 2 capsules (600 mg total) by mouth 3 (three) times daily. for neuropathy Patient taking differently: Take 600 mg by mouth See admin instructions. Take 600 mg by mouth in the morning & at bedtime and an additional  600 mg in the afternoon as needed for neuropathy 10/19/22   Rai, Ripudeep K, MD  ipratropium (ATROVENT ) 0.03 % nasal spray Place 2 sprays into both nostrils 2 (two) times daily. Patient taking differently: Place 2 sprays into both nostrils 2 (two) times daily as needed for rhinitis. 09/26/23   Hope Almarie ORN, NP  ipratropium-albuterol  (DUONEB) 0.5-2.5 (3) MG/3ML SOLN Take 3 mLs by nebulization every 6 (six) hours as needed. Patient taking differently: Take 3 mLs by nebulization every 6 (six) hours as needed (for shortness of breath or wheezing). 05/07/23 05/06/24  Mannam, Praveen, MD  levothyroxine  (SYNTHROID , LEVOTHROID) 175 MCG tablet Take 175 mcg by mouth daily before breakfast.    [provider]  Menthol -Camphor (TIGER BALM ARTHRITIS RUB  EX) Apply 1 application  topically as needed (for pain).    [provider]  montelukast  (SINGULAIR ) 10 MG tablet TAKE 1 TABLET(10 MG) BY MOUTH AT BEDTIME 10/16/23   Shelah Lamar RAMAN, MD  Multiple Minerals-Vitamins (CAL-MAG-ZINC -D) TABS Take 1 tablet by mouth at bedtime.    [provider]  Multiple Vitamin (MULTIVITAMIN WITH MINERALS) TABS tablet Take 1 tablet by mouth daily. 12/15/19   Sherrill Cable Latif, DO  ondansetron  (ZOFRAN ) 4 MG tablet Take 1  tablet (4 mg total) by mouth every 6 (six) hours. Patient taking differently: Take 4 mg by mouth every 6 (six) hours as needed for nausea or vomiting. 01/31/23   Kingsley, Victoria K, DO  oxyCODONE -acetaminophen  (PERCOCET) 10-325 MG tablet Take 1 tablet by mouth 3 (three) times daily as needed. 12/30/23   [provider]  OXYGEN  Inhale 2 L/min into the lungs continuous.    [provider]  pantoprazole  (PROTONIX ) 40 MG tablet Take 40 mg by mouth daily before breakfast. Patient taking differently: Take 40 mg by mouth 2 (two) times daily.    [provider]  Polyethyl Glyc-Propyl Glyc PF (SYSTANE ULTRA PF) 0.4-0.3 % SOLN Place 2 drops into both eyes 3 (three) times daily as needed (for dryness).    [provider]  polyethylene glycol powder (GLYCOLAX /MIRALAX ) 17 GM/SCOOP powder Take 17 g by mouth 2 (two) times daily. Dissolve 1 capful (17g) in 4-8 ounces of liquid and take by mouth twice daily. 01/05/24   Jillian Buttery, MD  potassium chloride  SA (KLOR-CON  M) 20 MEQ tablet Take 1 tablet (20 mEq total) by mouth 2 (two) times daily. 12/15/23   [place], Harlene HERO, FNP  predniSONE  (DELTASONE ) 2.5 MG tablet Take 1 tablet (2.5 mg total) by mouth daily with breakfast. 05/07/23   Mannam, Praveen, MD  roflumilast  (DALIRESP ) 500 MCG TABS tablet Take 1 tablet (500 mcg total) by mouth daily. 09/26/23   Hope Almarie ORN, NP  rOPINIRole  (REQUIP ) 1 MG tablet Take 1 tablet (1 mg total) by mouth in the morning and at bedtime. Patient taking differently: Take 1 mg by mouth every morning. 01/23/20   Cheryle Page, MD  rosuvastatin  (CRESTOR ) 10 MG tablet Take 10 mg by mouth at bedtime.  08/08/16   [provider]  spironolactone  (ALDACTONE ) 25 MG tablet Take 1 tablet (25 mg total) by mouth daily. Patient taking differently: Take 12.5 mg by mouth daily. 02/16/16   Lesia Ozell Barter, PA-C  torsemide  (DEMADEX ) 20 MG tablet Take 40 mg alternating with 20 mg daily. Patient taking  differently: Take 20-40 mg by mouth See admin instructions. Take 20 mg by mouth in the morning, ALTERNATING WITH 40 mg EVERY OTHER DAY 12/24/23   Glena Harlene HERO, FNP  traZODone  (DESYREL ) 50 MG tablet Take 50 mg by mouth at bedtime.    [provider]    Allergies: Cephalexin , Meloxicam, and Vancomycin     Review of Systems  Musculoskeletal:        Hip pain    Updated Vital Signs BP (!) 150/91 (BP Location: Right Arm)   Pulse (!) 59   Temp 97.6 F (36.4 C) (Oral)   Resp 17   SpO2 100%   Physical Exam Vitals and nursing note reviewed.  Constitutional:      General: He is not in acute distress.    Appearance: He is not ill-appearing or toxic-appearing.  HENT:     Head: Normocephalic and atraumatic.     Mouth/Throat:     Mouth: Mucous  membranes are moist.  Eyes:     General: No scleral icterus.       Right eye: No discharge.        Left eye: No discharge.     Conjunctiva/sclera: Conjunctivae normal.  Cardiovascular:     Rate and Rhythm: Normal rate and regular rhythm.     Pulses: Normal pulses.     Heart sounds: Normal heart sounds. No murmur heard. Pulmonary:     Effort: Pulmonary effort is normal. No respiratory distress.     Breath sounds: Wheezing present. No rhonchi or rales.  Abdominal:     General: Bowel sounds are normal.     Tenderness: There is no abdominal tenderness.  Musculoskeletal:     Right lower leg: No edema.     Left lower leg: No edema.     Comments: No swelling or erythema of left leg. +2 pedal pulse. Sensation to light touch intact. Area non-tense.  S/p right BKA.  Skin:    General: Skin is warm and dry.     Findings: No rash.  Neurological:     General: No focal deficit present.     Mental Status: He is alert and oriented to person, place, and time. Mental status is at baseline.  Psychiatric:        Mood and Affect: Mood normal.        Behavior: Behavior normal.     (all labs ordered are listed, but only abnormal results are  displayed) Labs Reviewed  CBC WITH DIFFERENTIAL/PLATELET - Abnormal; Notable for the following components:      Result Value   RBC 4.04 (*)    Hemoglobin 12.6 (*)    RDW 17.6 (*)    Platelets 132 (*)    All other components within normal limits  COMPREHENSIVE METABOLIC PANEL WITH GFR - Abnormal; Notable for the following components:   Chloride 95 (*)    CO2 33 (*)    Glucose, Bld 112 (*)    BUN 39 (*)    Creatinine, Ser 1.89 (*)    AST 113 (*)    ALT 80 (*)    GFR, Estimated 37 (*)    All other components within normal limits  RESP PANEL BY RT-PCR (RSV, FLU A&B, COVID)  [id number]  D-DIMER, QUANTITATIVE    EKG: None  Radiology: No results found.   Procedures   Medications Ordered in the ED  fentaNYL  (SUBLIMAZE ) injection 50 mcg (has no administration in time range)  ipratropium-albuterol  (DUONEB) 0.5-2.5 (3) MG/3ML nebulizer solution 3 mL (has no administration in time range)  methylPREDNISolone  sodium succinate (SOLU-MEDROL ) 125 mg/2 mL injection 125 mg (has no administration in time range)    Clinical Course as of 03/16/24 0637  Tue Mar 16, 2024  0445 Temp: 97.6 F (36.4 C) [SM]  [id number] Here for left hip pain radiating down the leg. Not compliant with his eliquis . Takes it for afib. On 2L at home for COPD. Pending US . If negative, can go home.  [JR]    Clinical Course User Index [JR] Robinson, John K, PA-C [SM] Hoy Nidia JULIANNA DEVONNA                                 Medical Decision Making Amount and/or Complexity of Data Reviewed Labs: ordered. Radiology: ordered.  Risk Prescription drug management.   This patient presents to the ED for concern of hip pain, this involves an extensive  number of treatment options, and is a complaint that carries with it a high risk of complications and morbidity.  The differential diagnosis includes hemarthrosis, gout, septic joint, fracture, tendonitis, muscle strain, bursitis, compartment syndrome   Co morbidities that  complicate the patient evaluation  asthma, chronic A-fib on Eliquis , HFpEF, COPD, hypertension, hyperlipidemia, hypothyroidism, left renal cell carcinoma, secondary AV block status post pacemaker placement   Additional history obtained:  Dr. Larnell PCP   Problem List / ED Course / Critical interventions / Medication management  Patient presents to ED concerned for left hip pain radiating down left leg. Patient incidentally also with wheezing and SOB worse than his baseline which he states is not concerning him as he is more concerned with his leg pain.  Physical exam with expiratory wheezing in BL lung fields which resolved after a breathing treatment.  There is also tenderness to palpation of left thigh.  Rest of physical exam reassuring.  Patient afebrile with stable vitals.  I Ordered, and personally interpreted labs.  D-dimer within normal limits.  CBC without leukocytosis.  There is mild anemia with hemoglobin at 12.6.  CMP with mildly low chloride at 95 and mildly elevated CO2 at 33.  BUN/creatinine near baseline at 39/1.89 today.   I ordered imaging studies including chest x-ray, hip x-ray, DVT US . Chest xray with vascular congestion. Left hip xray without acute abnormality. I have reviewed the patients home medicines and have made adjustments as needed   Social Determinants of Health:  none  6:30AM Care of REFUJIO HAYMER  transferred to PA [place] at the end of my shift as the patient will require reassessment once labs/imaging have resulted. Patient presentation, ED course, and plan of care discussed with review of all pertinent labs and imaging. Please see his/her note for further details regarding further ED course and disposition. Plan at time of handoff is reassess patient after DVT US  results. This may be altered or completely changed at the discretion of the oncoming team pending results of further workup.      Final diagnoses:  None    ED Discharge Orders     None           Hoy Nidia FALCON, [place] 03/16/24 [id number]    Raford Lenis, MD 03/16/24 [phone]

## 2024-04-03 ENCOUNTER — Other Ambulatory Visit: Payer: Self-pay | Admitting: Primary Care

## 2024-04-08 ENCOUNTER — Observation Stay (HOSPITAL_COMMUNITY)
Admission: EM | Admit: 2024-04-08 | Discharge: 2024-04-14 | DRG: 291 | Disposition: A | Discharge status: Skilled Nursing Facility | Attending: Internal Medicine | Admitting: Internal Medicine

## 2024-04-08 ENCOUNTER — Other Ambulatory Visit: Payer: Self-pay

## 2024-04-08 ENCOUNTER — Emergency Department (HOSPITAL_COMMUNITY)

## 2024-04-08 ENCOUNTER — Encounter (HOSPITAL_COMMUNITY): Payer: Self-pay

## 2024-04-08 ENCOUNTER — Emergency Department (HOSPITAL_COMMUNITY)
Admit: 2024-04-08 | Discharge: 2024-04-08 | Disposition: A | Discharge status: Home / Self Care | Attending: Emergency Medicine | Admitting: Emergency Medicine

## 2024-04-08 DIAGNOSIS — M109 Gout, unspecified: Secondary | ICD-10-CM | POA: Diagnosis present

## 2024-04-08 DIAGNOSIS — E785 Hyperlipidemia, unspecified: Secondary | ICD-10-CM | POA: Diagnosis present

## 2024-04-08 DIAGNOSIS — E039 Hypothyroidism, unspecified: Secondary | ICD-10-CM | POA: Diagnosis present

## 2024-04-08 DIAGNOSIS — D539 Nutritional anemia, unspecified: Secondary | ICD-10-CM | POA: Diagnosis present

## 2024-04-08 DIAGNOSIS — G4730 Sleep apnea, unspecified: Secondary | ICD-10-CM | POA: Diagnosis present

## 2024-04-08 DIAGNOSIS — E1142 Type 2 diabetes mellitus with diabetic polyneuropathy: Secondary | ICD-10-CM | POA: Diagnosis present

## 2024-04-08 DIAGNOSIS — Z881 Allergy status to other antibiotic agents status: Secondary | ICD-10-CM

## 2024-04-08 DIAGNOSIS — Z79899 Other long term (current) drug therapy: Secondary | ICD-10-CM

## 2024-04-08 DIAGNOSIS — Z7901 Long term (current) use of anticoagulants: Secondary | ICD-10-CM

## 2024-04-08 DIAGNOSIS — Z79890 Hormone replacement therapy (postmenopausal): Secondary | ICD-10-CM

## 2024-04-08 DIAGNOSIS — Z7951 Long term (current) use of inhaled steroids: Secondary | ICD-10-CM

## 2024-04-08 DIAGNOSIS — Z923 Personal history of irradiation: Secondary | ICD-10-CM

## 2024-04-08 DIAGNOSIS — D696 Thrombocytopenia, unspecified: Secondary | ICD-10-CM | POA: Diagnosis present

## 2024-04-08 DIAGNOSIS — Z87891 Personal history of nicotine dependence: Secondary | ICD-10-CM

## 2024-04-08 DIAGNOSIS — K219 Gastro-esophageal reflux disease without esophagitis: Secondary | ICD-10-CM | POA: Diagnosis present

## 2024-04-08 DIAGNOSIS — G2581 Restless legs syndrome: Secondary | ICD-10-CM | POA: Diagnosis present

## 2024-04-08 DIAGNOSIS — J449 Chronic obstructive pulmonary disease, unspecified: Secondary | ICD-10-CM | POA: Diagnosis present

## 2024-04-08 DIAGNOSIS — R531 Weakness: Secondary | ICD-10-CM

## 2024-04-08 DIAGNOSIS — Z66 Do not resuscitate: Secondary | ICD-10-CM | POA: Diagnosis present

## 2024-04-08 DIAGNOSIS — Z89511 Acquired absence of right leg below knee: Secondary | ICD-10-CM

## 2024-04-08 DIAGNOSIS — J4489 Other specified chronic obstructive pulmonary disease: Secondary | ICD-10-CM | POA: Diagnosis present

## 2024-04-08 DIAGNOSIS — I130 Hypertensive heart and chronic kidney disease with heart failure and stage 1 through stage 4 chronic kidney disease, or unspecified chronic kidney disease: Principal | ICD-10-CM | POA: Diagnosis present

## 2024-04-08 DIAGNOSIS — K760 Fatty (change of) liver, not elsewhere classified: Secondary | ICD-10-CM | POA: Diagnosis present

## 2024-04-08 DIAGNOSIS — N1832 Chronic kidney disease, stage 3b: Secondary | ICD-10-CM | POA: Diagnosis present

## 2024-04-08 DIAGNOSIS — I2510 Atherosclerotic heart disease of native coronary artery without angina pectoris: Secondary | ICD-10-CM | POA: Diagnosis present

## 2024-04-08 DIAGNOSIS — Z823 Family history of stroke: Secondary | ICD-10-CM

## 2024-04-08 DIAGNOSIS — G8929 Other chronic pain: Secondary | ICD-10-CM

## 2024-04-08 DIAGNOSIS — R52 Pain, unspecified: Secondary | ICD-10-CM

## 2024-04-08 DIAGNOSIS — Z7984 Long term (current) use of oral hypoglycemic drugs: Secondary | ICD-10-CM

## 2024-04-08 DIAGNOSIS — M79605 Pain in left leg: Secondary | ICD-10-CM

## 2024-04-08 DIAGNOSIS — Z7952 Long term (current) use of systemic steroids: Secondary | ICD-10-CM

## 2024-04-08 DIAGNOSIS — Z8249 Family history of ischemic heart disease and other diseases of the circulatory system: Secondary | ICD-10-CM

## 2024-04-08 DIAGNOSIS — Z794 Long term (current) use of insulin: Secondary | ICD-10-CM

## 2024-04-08 DIAGNOSIS — R5381 Other malaise: Secondary | ICD-10-CM | POA: Diagnosis present

## 2024-04-08 DIAGNOSIS — I480 Paroxysmal atrial fibrillation: Secondary | ICD-10-CM | POA: Diagnosis present

## 2024-04-08 DIAGNOSIS — I10 Essential (primary) hypertension: Secondary | ICD-10-CM | POA: Diagnosis present

## 2024-04-08 DIAGNOSIS — E119 Type 2 diabetes mellitus without complications: Secondary | ICD-10-CM

## 2024-04-08 DIAGNOSIS — Z9981 Dependence on supplemental oxygen: Secondary | ICD-10-CM

## 2024-04-08 DIAGNOSIS — E1122 Type 2 diabetes mellitus with diabetic chronic kidney disease: Secondary | ICD-10-CM | POA: Diagnosis present

## 2024-04-08 DIAGNOSIS — D509 Iron deficiency anemia, unspecified: Secondary | ICD-10-CM | POA: Diagnosis present

## 2024-04-08 DIAGNOSIS — N433 Hydrocele, unspecified: Principal | ICD-10-CM

## 2024-04-08 DIAGNOSIS — Z886 Allergy status to analgesic agent status: Secondary | ICD-10-CM

## 2024-04-08 DIAGNOSIS — M797 Fibromyalgia: Secondary | ICD-10-CM | POA: Diagnosis present

## 2024-04-08 DIAGNOSIS — I5033 Acute on chronic diastolic (congestive) heart failure: Secondary | ICD-10-CM | POA: Diagnosis present

## 2024-04-08 DIAGNOSIS — Z1152 Encounter for screening for COVID-19: Secondary | ICD-10-CM

## 2024-04-08 DIAGNOSIS — Z950 Presence of cardiac pacemaker: Secondary | ICD-10-CM

## 2024-04-08 DIAGNOSIS — Z952 Presence of prosthetic heart valve: Secondary | ICD-10-CM

## 2024-04-08 LAB — COMPREHENSIVE METABOLIC PANEL WITH GFR
ALT: 28 U/L (ref 0–44)
AST: 34 U/L (ref 15–41)
Albumin: 4.5 g/dL (ref 3.5–5.0)
Alkaline Phosphatase: 128 U/L — ABNORMAL HIGH (ref 38–126)
Anion gap: 12 (ref 5–15)
BUN: 40 mg/dL — ABNORMAL HIGH (ref 8–23)
CO2: 32 mmol/L (ref 22–32)
Calcium: 10.1 mg/dL (ref 8.9–10.3)
Chloride: 97 mmol/L — ABNORMAL LOW (ref 98–111)
Creatinine, Ser: 2.08 mg/dL — ABNORMAL HIGH (ref 0.61–1.24)
GFR, Estimated: 33 mL/min — ABNORMAL LOW (ref >60)
Glucose, Bld: 201 mg/dL — ABNORMAL HIGH (ref 70–99)
Potassium: 3.6 mmol/L (ref 3.5–5.1)
Sodium: 140 mmol/L (ref 135–145)
Total Bilirubin: 0.6 mg/dL (ref 0.0–1.2)
Total Protein: 7.8 g/dL (ref 6.5–8.1)

## 2024-04-08 LAB — CBC WITH DIFFERENTIAL/PLATELET
Abs Immature Granulocytes: 0.03 K/uL (ref 0.00–0.07)
Basophils Absolute: 0 K/uL (ref 0.0–0.1)
Basophils Relative: 1 %
Eosinophils Absolute: 0.1 K/uL (ref 0.0–0.5)
Eosinophils Relative: 2 %
HCT: 36.5 % — ABNORMAL LOW (ref 39.0–52.0)
Hemoglobin: 11.5 g/dL — ABNORMAL LOW (ref 13.0–17.0)
Immature Granulocytes: 1 %
Lymphocytes Relative: 13 %
Lymphs Abs: 0.8 K/uL (ref 0.7–4.0)
MCH: 32.2 pg (ref 26.0–34.0)
MCHC: 31.5 g/dL (ref 30.0–36.0)
MCV: 102.2 fL — ABNORMAL HIGH (ref 80.0–100.0)
Monocytes Absolute: 0.5 K/uL (ref 0.1–1.0)
Monocytes Relative: 8 %
Neutro Abs: 4.8 K/uL (ref 1.7–7.7)
Neutrophils Relative %: 75 %
Platelets: 132 K/uL — ABNORMAL LOW (ref 150–400)
RBC: 3.57 MIL/uL — ABNORMAL LOW (ref 4.22–5.81)
RDW: 18.2 % — ABNORMAL HIGH (ref 11.5–15.5)
WBC: 6.2 K/uL (ref 4.0–10.5)
nRBC: 0 % (ref 0.0–0.2)

## 2024-04-08 LAB — RESP PANEL BY RT-PCR (RSV, FLU A&B, COVID)  RVPGX2
Influenza A by PCR: NEGATIVE
Influenza B by PCR: NEGATIVE
Resp Syncytial Virus by PCR: NEGATIVE
SARS Coronavirus 2 by RT PCR: NEGATIVE

## 2024-04-08 LAB — LIPASE, BLOOD: Lipase: 30 U/L (ref 11–51)

## 2024-04-08 LAB — TROPONIN T, HIGH SENSITIVITY
Troponin T High Sensitivity: 81 ng/L — ABNORMAL HIGH (ref 0–19)
Troponin T High Sensitivity: 75 ng/L — ABNORMAL HIGH (ref 0–19)

## 2024-04-08 LAB — PRO BRAIN NATRIURETIC PEPTIDE: Pro Brain Natriuretic Peptide: 1840 pg/mL — ABNORMAL HIGH (ref <300.0)

## 2024-04-08 MED ORDER — IOHEXOL 350 MG/ML SOLN
75.0000 mL | Freq: Once | INTRAVENOUS | Status: AC | PRN
  Administered 2024-04-08: 75 mL via INTRAVENOUS

## 2024-04-08 MED ORDER — OXYCODONE-ACETAMINOPHEN 10-325 MG PO TABS: 1.0000 | ORAL_TABLET | Freq: Three times a day (TID) | ORAL | Status: DC | PRN

## 2024-04-08 MED ORDER — LEVOTHYROXINE SODIUM 50 MCG PO TABS
175.0000 ug | ORAL_TABLET | Freq: Every day | ORAL | Status: DC
  Administered 2024-04-09: 175 ug via ORAL
  Filled 2024-04-08: qty 1

## 2024-04-08 MED ORDER — MORPHINE SULFATE (PF) 4 MG/ML IV SOLN
4.0000 mg | Freq: Once | INTRAVENOUS | Status: AC
  Administered 2024-04-08: 4 mg via INTRAVENOUS
  Filled 2024-04-08: qty 1

## 2024-04-08 MED ORDER — PANTOPRAZOLE SODIUM 40 MG PO TBEC
40.0000 mg | DELAYED_RELEASE_TABLET | Freq: Two times a day (BID) | ORAL | Status: DC
  Administered 2024-04-08 – 2024-04-14 (×12): 40 mg via ORAL
  Filled 2024-04-08 (×11): qty 1

## 2024-04-08 MED ORDER — ROPINIROLE HCL 1 MG PO TABS
1.0000 mg | ORAL_TABLET | Freq: Every morning | ORAL | Status: DC
  Administered 2024-04-09 – 2024-04-14 (×6): 1 mg via ORAL
  Filled 2024-04-08 (×6): qty 1

## 2024-04-08 MED ORDER — IPRATROPIUM-ALBUTEROL 0.5-2.5 (3) MG/3ML IN SOLN
3.0000 mL | Freq: Once | RESPIRATORY_TRACT | Status: AC
  Administered 2024-04-08: 3 mL via RESPIRATORY_TRACT
  Filled 2024-04-08: qty 3

## 2024-04-08 MED ORDER — CYCLOBENZAPRINE HCL 10 MG PO TABS
5.0000 mg | ORAL_TABLET | Freq: Once | ORAL | Status: AC
  Administered 2024-04-08: 5 mg via ORAL
  Filled 2024-04-08: qty 1

## 2024-04-08 MED ORDER — OXYCODONE HCL 5 MG PO TABS
5.0000 mg | ORAL_TABLET | Freq: Three times a day (TID) | ORAL | Status: DC | PRN
  Administered 2024-04-08 – 2024-04-14 (×11): 5 mg via ORAL
  Filled 2024-04-08 (×12): qty 1

## 2024-04-08 MED ORDER — TRAZODONE HCL 50 MG PO TABS
50.0000 mg | ORAL_TABLET | Freq: Every day | ORAL | Status: DC
  Administered 2024-04-08: 50 mg via ORAL
  Filled 2024-04-08: qty 1

## 2024-04-08 MED ORDER — ONDANSETRON HCL 4 MG PO TABS: 4.0000 mg | ORAL_TABLET | Freq: Three times a day (TID) | ORAL | Status: DC | PRN

## 2024-04-08 MED ORDER — TORSEMIDE 20 MG PO TABS: 20.0000 mg | ORAL_TABLET | ORAL | Status: DC

## 2024-04-08 MED ORDER — ALUM & MAG HYDROXIDE-SIMETH 200-200-20 MG/5ML PO SUSP: 30.0000 mL | Freq: Four times a day (QID) | ORAL | Status: DC | PRN

## 2024-04-08 MED ORDER — OXYCODONE-ACETAMINOPHEN 5-325 MG PO TABS
1.0000 | ORAL_TABLET | Freq: Three times a day (TID) | ORAL | Status: DC | PRN
  Administered 2024-04-08 – 2024-04-14 (×8): 1 via ORAL
  Filled 2024-04-08 (×8): qty 1

## 2024-04-08 MED ORDER — APIXABAN 5 MG PO TABS
5.0000 mg | ORAL_TABLET | Freq: Two times a day (BID) | ORAL | Status: DC
  Administered 2024-04-08 – 2024-04-14 (×12): 5 mg via ORAL
  Filled 2024-04-08 (×12): qty 1

## 2024-04-08 MED ORDER — GABAPENTIN 300 MG PO CAPS: 600.0000 mg | ORAL_CAPSULE | ORAL | Status: DC

## 2024-04-08 MED ORDER — BISOPROLOL FUMARATE 5 MG PO TABS
5.0000 mg | ORAL_TABLET | Freq: Every day | ORAL | Status: DC
  Administered 2024-04-08 – 2024-04-10 (×3): 5 mg via ORAL
  Filled 2024-04-08 (×3): qty 1

## 2024-04-08 MED ORDER — ONDANSETRON HCL 4 MG/2ML IJ SOLN
4.0000 mg | Freq: Once | INTRAMUSCULAR | Status: AC
  Administered 2024-04-08: 4 mg via INTRAVENOUS
  Filled 2024-04-08: qty 2

## 2024-04-08 NOTE — Progress Notes (Signed)
PT eval pending.

## 2024-04-08 NOTE — ED Triage Notes (Signed)
 Pt came from home with c/o CP and SOB that started today. Pt also reports feeling nauseous, but not vomiting

## 2024-04-08 NOTE — Progress Notes (Signed)
 Left lower extremity venous duplex has been completed. Preliminary results can be found in CV Proc through chart review.  Results were given to Dr. Elnor.  04/08/2024 3:58 PM Cathlyn Collet RVT

## 2024-04-08 NOTE — NC FL2 (Signed)
 [place]  MEDICAID FL2 LEVEL OF CARE FORM     IDENTIFICATION  Patient Name: [name] Birthdate: 02/06/1951 Sex: male Admission Date (Current Location): 04/08/2024  [facility] and [place] Number:  Producer, Television/film/video and Address:  [facility],  [address] [place], [place] [address]      Provider Number: [id number]  Attending Physician Name and Address:  Patt Alm Macho, MD  Relative Name and Phone Number:  Azael, Ragain, [phone]    Current Level of Care: Hospital Recommended Level of Care: Skilled Nursing Facility Prior Approval Number:    Date Approved/Denied:   PASRR Number: [id number]  Discharge Plan: SNF    Current Diagnoses: Patient Active Problem List   Diagnosis Date Noted   Intractable abdominal pain 01/02/2024   Cervical radiculopathy 01/02/2024   Spinal stenosis in cervical region 01/02/2024   Gouty arthritis 01/02/2024   Morbid obesity (HCC) 01/02/2024   Renal cell carcinoma of left kidney (HCC) 01/02/2024   Intractable nausea 01/02/2024   AKI (acute kidney injury) 12/22/2022   Fall 11/14/2022   Pain of right lower extremity due to injury 11/14/2022   Diabetic ulcer of lower leg (HCC) 11/13/2022   Osteomyelitis (HCC) 11/13/2022   Dyslipidemia 10/18/2022   Cellulitis of right lower extremity 10/17/2022   Dehiscence of amputation stump of right lower extremity (HCC) 10/09/2022   Below-knee amputation of right lower extremity (HCC) 10/09/2022   Gangrene of right foot (HCC) 09/04/2022   History of below-knee amputation of right lower extremity (HCC) 09/04/2022   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) 08/23/2022   CAP (community acquired pneumonia) 12/28/2021   Constipation 12/28/2021   Pain in both lower extremities 07/03/2021   Peripheral neuropathy 07/03/2021   Acute metabolic encephalopathy 06/09/2021   Marijuana dependence (HCC) 06/09/2021   DNR (do not resuscitate) 06/09/2021   Chest pain 06/08/2021    Renal lesion 02/14/2021   Status post cryoablation 02/14/2021   Allergic rhinitis 12/08/2020   Preop pulmonary/respiratory exam 03/27/2020   Acute on chronic diastolic (congestive) heart failure (HCC) 01/22/2020   Abdominal pain 01/22/2020   Hypothyroidism 12/13/2019   DM2 (diabetes mellitus, type 2) (HCC) 12/13/2019   Chronic kidney disease, stage 3b (HCC) 12/13/2019   Acute bacterial bronchitis 12/13/2019   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   Typical atrial flutter (HCC)    Chronic respiratory failure with hypoxia (HCC) 05/23/2016   CAD (coronary artery disease) 05/23/2016   Persistent atrial fibrillation (HCC)    Atypical atrial flutter (HCC)    Visit for monitoring Tikosyn  therapy 04/16/2016   Chronic diastolic CHF (congestive heart failure) (HCC) 02/03/2016   Cough 12/06/2015   Acute on chronic respiratory failure with hypoxia (HCC) 09/08/2015   Hallucinations 09/04/2015   Lymphadenopathy 09/04/2015   Ascending aortic aneurysm 09/04/2015   Hypoxia 09/02/2015   S/P aortic valve replacement with bioprosthetic valve 09/02/2015   Pleural effusion 09/02/2015   S/P AVR 08/22/2015   Umbilical hernia 12/12/2014   GERD without esophagitis 09/20/2014   Fibromyalgia 06/14/2014   Restless legs syndrome 06/14/2014   Intrinsic asthma 10/13/2013   Dyspnea 09/15/2013   Pulmonary hypertension (HCC) 09/15/2013   Shortness of breath 05/05/2013   Pacemaker-St.Jude 04/01/2012   Bradycardia 03/31/2012   Second degree AV block 03/31/2012   AV block, 1st degree 02/01/2012   PAF (paroxysmal atrial fibrillation) (HCC) 11/13/2011   Fatigue 11/13/2011   Essential hypertension 11/13/2011   Aortic stenosis 11/13/2011   Sleep apnea 11/13/2011    Orientation RESPIRATION  BLADDER Height & Weight     Self, Time, Situation, Place  O2 (2L) Continent Weight:   Height:     BEHAVIORAL SYMPTOMS/MOOD NEUROLOGICAL BOWEL NUTRITION STATUS      Continent Diet (see dc summary)  AMBULATORY STATUS  COMMUNICATION OF NEEDS Skin   Limited Assist Verbally Normal                       Personal Care Assistance Level of Assistance  Dressing, Feeding, Bathing Bathing Assistance: Limited assistance Feeding assistance: Independent Dressing Assistance: Limited assistance     Functional Limitations Info  Speech, Hearing, Sight Sight Info: Adequate Hearing Info: Adequate Speech Info: Adequate    SPECIAL CARE FACTORS FREQUENCY  PT (By licensed PT), OT (By licensed OT)     PT Frequency: 5x/wk OT Frequency: 5x/wk            Contractures Contractures Info: Not present    Additional Factors Info  Code Status, Allergies Code Status Info: DNR Allergies Info: Cephalexin   Meloxicam  Vancomycin            Current Medications (04/08/2024):  This is the current hospital active medication list Current Facility-Administered Medications  Medication Dose Route Frequency Provider Last Rate Last Admin   alum & mag hydroxide-simeth (MAALOX/MYLANTA) 200-200-20 MG/5ML suspension 30 mL  30 mL Oral Q6H PRN Elnor Jayson LABOR, DO       apixaban  (ELIQUIS ) tablet 5 mg  5 mg Oral BID Yao, David Hsienta, MD       bisoprolol  (ZEBETA ) tablet 5 mg  5 mg Oral Daily Patt Alm Macho, MD   5 mg at 04/08/24 1832   gabapentin  (NEURONTIN ) capsule 600 mg  600 mg Oral See admin instructions Patt Alm Macho, MD       [START ON 04/09/2024] levothyroxine  (SYNTHROID ) tablet 175 mcg  175 mcg Oral QAC breakfast Patt Alm Macho, MD       ondansetron  (ZOFRAN ) tablet 4 mg  4 mg Oral Q8H PRN Elnor Jayson A, DO       oxyCODONE -acetaminophen  (PERCOCET/ROXICET) 5-325 MG per tablet 1 tablet  1 tablet Oral TID PRN Patt Alm Macho, MD       And   oxyCODONE  (Oxy IR/ROXICODONE ) immediate release tablet 5 mg  5 mg Oral TID PRN Patt Alm Macho, MD       pantoprazole  (PROTONIX ) EC tablet 40 mg  40 mg Oral BID Yao, David Hsienta, MD       [START ON 04/09/2024] rOPINIRole  (REQUIP ) tablet 1 mg  1 mg Oral q morning Patt Alm Macho, MD       torsemide  (DEMADEX ) tablet 20-40 mg  20-40 mg Oral See admin instructions Patt Alm Macho, MD       traZODone  (DESYREL ) tablet 50 mg  50 mg Oral QHS Patt Alm Macho, MD       Current Outpatient Medications  Medication Sig Dispense Refill   acetaminophen  (TYLENOL ) 325 MG tablet Take 2 tablets (650 mg total) by mouth every 6 (six) hours as needed. (Patient taking differently: Take 650 mg by mouth every 6 (six) hours as needed for mild pain (pain score 1-3) (or headaches).)     albuterol  (VENTOLIN  HFA) 108 (90 Base) MCG/ACT inhaler Inhale 2 puffs into the lungs every 6 (six) hours as needed for wheezing or shortness of breath. 1 each 5   allopurinol (ZYLOPRIM) 300 MG tablet Take 300 mg by mouth daily.     apixaban  (ELIQUIS ) 5 MG TABS tablet Take  1 tablet (5 mg total) by mouth 2 (two) times daily. 180 tablet 3   bisoprolol  (ZEBETA ) 5 MG tablet Take 1 tablet (5 mg total) by mouth daily. 30 tablet 11   Budeson-Glycopyrrol-Formoterol  (BREZTRI  AEROSPHERE) 160-9-4.8 MCG/ACT AERO Inhale 2 puffs into the lungs in the morning and at bedtime. 32.1 g 3   Cholecalciferol  (VITAMIN D3) 50 MCG (2000 UT) TABS Take 4,000 Units by mouth daily.     colchicine 0.6 MG tablet Take 0.6 mg by mouth daily as needed (for gout flares- as directed).     empagliflozin  (JARDIANCE ) 10 MG TABS tablet Take 1 tablet (10 mg total) by mouth daily. 30 tablet 6   gabapentin  (NEURONTIN ) 300 MG capsule Take 2 capsules (600 mg total) by mouth 3 (three) times daily. for neuropathy (Patient taking differently: Take 600 mg by mouth See admin instructions. Take 600 mg by mouth in the morning & at bedtime and an additional  600 mg in the afternoon as needed for neuropathy) 180 capsule 3   ipratropium (ATROVENT ) 0.03 % nasal spray USE 2 SPRAYS IN BOTH NOSTRILS  TWICE DAILY 90 mL 2   ipratropium-albuterol  (DUONEB) 0.5-2.5 (3) MG/3ML SOLN Take 3 mLs by nebulization every 6 (six) hours as needed. (Patient taking  differently: Take 3 mLs by nebulization every 6 (six) hours as needed (for shortness of breath or wheezing).) 120 mL 3   levothyroxine  (SYNTHROID , LEVOTHROID) 175 MCG tablet Take 175 mcg by mouth daily before breakfast.     Menthol -Camphor (TIGER BALM ARTHRITIS RUB EX) Apply 1 application  topically as needed (for pain).     montelukast  (SINGULAIR ) 10 MG tablet TAKE 1 TABLET(10 MG) BY MOUTH AT BEDTIME 90 tablet 3   Multiple Minerals-Vitamins (CAL-MAG-ZINC -D) TABS Take 1 tablet by mouth at bedtime.     Multiple Vitamin (MULTIVITAMIN WITH MINERALS) TABS tablet Take 1 tablet by mouth daily. 30 tablet 0   ondansetron  (ZOFRAN ) 4 MG tablet Take 1 tablet (4 mg total) by mouth every 6 (six) hours. (Patient taking differently: Take 4 mg by mouth every 6 (six) hours as needed for nausea or vomiting.) 12 tablet 0   oxyCODONE -acetaminophen  (PERCOCET) 10-325 MG tablet Take 1 tablet by mouth 3 (three) times daily as needed.     OXYGEN  Inhale 2 L/min into the lungs continuous.     pantoprazole  (PROTONIX ) 40 MG tablet Take 40 mg by mouth daily before breakfast. (Patient taking differently: Take 40 mg by mouth 2 (two) times daily.)     Polyethyl Glyc-Propyl Glyc PF (SYSTANE ULTRA PF) 0.4-0.3 % SOLN Place 2 drops into both eyes 3 (three) times daily as needed (for dryness).     polyethylene glycol powder (GLYCOLAX /MIRALAX ) 17 GM/SCOOP powder Take 17 g by mouth 2 (two) times daily. Dissolve 1 capful (17g) in 4-8 ounces of liquid and take by mouth twice daily. 238 g 0   potassium chloride  SA (KLOR-CON  M) 20 MEQ tablet Take 1 tablet (20 mEq total) by mouth 2 (two) times daily. 60 tablet 11   predniSONE  (DELTASONE ) 2.5 MG tablet Take 1 tablet (2.5 mg total) by mouth daily with breakfast. 90 tablet 3   roflumilast  (DALIRESP ) 500 MCG TABS tablet Take 1 tablet (500 mcg total) by mouth daily. 30 tablet 11   rOPINIRole  (REQUIP ) 1 MG tablet Take 1 tablet (1 mg total) by mouth in the morning and at bedtime. (Patient taking  differently: Take 1 mg by mouth every morning.)     rosuvastatin  (CRESTOR ) 10 MG tablet Take 10 mg  by mouth at bedtime.      spironolactone  (ALDACTONE ) 25 MG tablet Take 1 tablet (25 mg total) by mouth daily. (Patient taking differently: Take 12.5 mg by mouth daily.) 90 tablet 3   torsemide  (DEMADEX ) 20 MG tablet Take 40 mg alternating with 20 mg daily. (Patient taking differently: Take 20-40 mg by mouth See admin instructions. Take 20 mg by mouth in the morning, ALTERNATING WITH 40 mg EVERY OTHER DAY)     traZODone  (DESYREL ) 50 MG tablet Take 50 mg by mouth at bedtime.       Discharge Medications: Please see discharge summary for a list of discharge medications.  Relevant Imaging Results:  Relevant Lab Results:   Additional Information SSN [id number]  Sheri ONEIDA Sharps, LCSW

## 2024-04-08 NOTE — ED Provider Notes (Signed)
 [place] EMERGENCY DEPARTMENT AT [facility] Provider Note  CSN: [id number] Arrival date & time: 04/08/24 1216  Chief Complaint(s) Chest Pain  HPI [name] is a 73 y.o. male with past medical history as below, significant for atrial fibrillation, CHF, DM 2, COPD, GERD, hyperlipidemia, hypertension, right sided BKA who presents to the ED with complaint of cp, dyspnea, scrotal pain, left leg pain, weakness  Patient with multiple complaints.  He has been having intermittent chest pain over the past few weeks, seems to be worsening, is intermittent, sharp and stabbing pain on his left side.  Also having some exertional dyspnea and nonproductive cough, he is on 2 L nasal cannula at home.  No fevers or chills.  Also having some pain in his left testicle and pain down the medial aspect of his left proximal thigh.  He has been having progressively worsening weakness over the past few days, unable to get from his bed to wheelchair, wife unable to get him up out of bed.  Unable to go to PCP office today secondary to weakness.  Normally can transfer independently into his wheelchair but has been unable to do so over the past few days  He was seen in the ED on 03/16/2024 with left leg pain, variable compliance Eliquis .  Workup at that time was reassuring he was discharged in stable condition  Past Medical History Past Medical History:  Diagnosis Date   Aortic stenosis, mild    Arthritis of hand    just a little bit in both hands (03/31/2012)   Asthma    little bit (03/31/2012)   Atrial fibrillation (HCC)    dx '04; DCCV '04, placed on flecainide, failed DCCV 04/2010, flecainide stopped; s/p successful A.fib ablation 01/31/12   CHF (congestive heart failure) (HCC)    Controlled type 2 diabetes mellitus without complication, without long-term current use of insulin  (HCC) 12/13/2019   COPD (chronic obstructive pulmonary disease) (HCC)    DJD (degenerative joint disease)    Fatty  liver    Fibromyalgia    GERD (gastroesophageal reflux disease)    Hyperlipidemia    Hypertension    Hypothyroidism    S/P radiation   Left atrial enlargement    LA size 45mm by echo 11/21/11   Mitral regurgitation    trivial   Obstructive sleep apnea    mild by sleep study 2013; pt stated he does not have a machine because it wasn't bad enough for him to have one   Pacemaker 03/31/2012   Pneumonia    last time 2023   Restless leg syndrome    Second degree AV block    Splenomegaly    Patient Active Problem List   Diagnosis Date Noted   Intractable abdominal pain 01/02/2024   Cervical radiculopathy 01/02/2024   Spinal stenosis in cervical region 01/02/2024   Gouty arthritis 01/02/2024   Morbid obesity (HCC) 01/02/2024   Renal cell carcinoma of left kidney (HCC) 01/02/2024   Intractable nausea 01/02/2024   AKI (acute kidney injury) 12/22/2022   Fall 11/14/2022   Pain of right lower extremity due to injury 11/14/2022   Diabetic ulcer of lower leg (HCC) 11/13/2022   Osteomyelitis (HCC) 11/13/2022   Dyslipidemia 10/18/2022   Cellulitis of right lower extremity 10/17/2022   Dehiscence of amputation stump of right lower extremity (HCC) 10/09/2022   Below-knee amputation of right lower extremity (HCC) 10/09/2022   Gangrene of right foot (HCC) 09/04/2022   History of below-knee amputation of right  lower extremity (HCC) 09/04/2022   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) 08/23/2022   CAP (community acquired pneumonia) 12/28/2021   Constipation 12/28/2021   Pain in both lower extremities 07/03/2021   Peripheral neuropathy 07/03/2021   Acute metabolic encephalopathy 06/09/2021   Marijuana dependence (HCC) 06/09/2021   DNR (do not resuscitate) 06/09/2021   Chest pain 06/08/2021   Renal lesion 02/14/2021   Status post cryoablation 02/14/2021   Allergic rhinitis 12/08/2020   Preop pulmonary/respiratory exam 03/27/2020   Acute on chronic diastolic (congestive) heart  failure (HCC) 01/22/2020   Abdominal pain 01/22/2020   Hypothyroidism 12/13/2019   DM2 (diabetes mellitus, type 2) (HCC) 12/13/2019   Chronic kidney disease, stage 3b (HCC) 12/13/2019   Acute bacterial bronchitis 12/13/2019   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   Typical atrial flutter (HCC)    Chronic respiratory failure with hypoxia (HCC) 05/23/2016   CAD (coronary artery disease) 05/23/2016   Persistent atrial fibrillation (HCC)    Atypical atrial flutter (HCC)    Visit for monitoring Tikosyn  therapy 04/16/2016   Chronic diastolic CHF (congestive heart failure) (HCC) 02/03/2016   Cough 12/06/2015   Acute on chronic respiratory failure with hypoxia (HCC) 09/08/2015   Hallucinations 09/04/2015   Lymphadenopathy 09/04/2015   Ascending aortic aneurysm 09/04/2015   Hypoxia 09/02/2015   S/P aortic valve replacement with bioprosthetic valve 09/02/2015   Pleural effusion 09/02/2015   S/P AVR 08/22/2015   Umbilical hernia 12/12/2014   GERD without esophagitis 09/20/2014   Fibromyalgia 06/14/2014   Restless legs syndrome 06/14/2014   Intrinsic asthma 10/13/2013   Dyspnea 09/15/2013   Pulmonary hypertension (HCC) 09/15/2013   Shortness of breath 05/05/2013   Pacemaker-St.Jude 04/01/2012   Bradycardia 03/31/2012   Second degree AV block 03/31/2012   AV block, 1st degree 02/01/2012   PAF (paroxysmal atrial fibrillation) (HCC) 11/13/2011   Fatigue 11/13/2011   Essential hypertension 11/13/2011   Aortic stenosis 11/13/2011   Sleep apnea 11/13/2011   Home Medication(s) Prior to Admission medications  Medication Sig Start Date End Date Taking? Authorizing Provider  acetaminophen  (TYLENOL ) 325 MG tablet Take 2 tablets (650 mg total) by mouth every 6 (six) hours as needed. Patient taking differently: Take 650 mg by mouth every 6 (six) hours as needed for mild pain (pain score 1-3) (or headaches). 12/24/22   Nicholas Bar, MD  albuterol  (VENTOLIN  HFA) 108 (90 Base) MCG/ACT inhaler Inhale 2  puffs into the lungs every 6 (six) hours as needed for wheezing or shortness of breath. 05/07/23   Mannam, Praveen, MD  allopurinol (ZYLOPRIM) 300 MG tablet Take 300 mg by mouth daily.    [provider]  apixaban  (ELIQUIS ) 5 MG TABS tablet Take 1 tablet (5 mg total) by mouth 2 (two) times daily. 10/28/23   Bensimhon, Toribio SAUNDERS, MD  bisoprolol  (ZEBETA ) 5 MG tablet Take 1 tablet (5 mg total) by mouth daily. 01/06/24   Bensimhon, Toribio SAUNDERS, MD  Budeson-Glycopyrrol-Formoterol  (BREZTRI  AEROSPHERE) 160-9-4.8 MCG/ACT AERO Inhale 2 puffs into the lungs in the morning and at bedtime. 05/07/23   Mannam, Praveen, MD  Cholecalciferol  (VITAMIN D3) 50 MCG (2000 UT) TABS Take 4,000 Units by mouth daily.    [provider]  colchicine 0.6 MG tablet Take 0.6 mg by mouth daily as needed (for gout flares- as directed). 10/14/23   [provider]  empagliflozin  (JARDIANCE ) 10 MG TABS tablet Take 1 tablet (10 mg total) by mouth daily. 02/02/24   Bensimhon, Toribio SAUNDERS, MD  gabapentin  (NEURONTIN ) 300 MG  capsule Take 2 capsules (600 mg total) by mouth 3 (three) times daily. for neuropathy Patient taking differently: Take 600 mg by mouth See admin instructions. Take 600 mg by mouth in the morning & at bedtime and an additional  600 mg in the afternoon as needed for neuropathy 10/19/22   Rai, Ripudeep K, MD  ipratropium (ATROVENT ) 0.03 % nasal spray USE 2 SPRAYS IN BOTH NOSTRILS  TWICE DAILY 04/07/24   Hope Almarie ORN, NP  ipratropium-albuterol  (DUONEB) 0.5-2.5 (3) MG/3ML SOLN Take 3 mLs by nebulization every 6 (six) hours as needed. Patient taking differently: Take 3 mLs by nebulization every 6 (six) hours as needed (for shortness of breath or wheezing). 05/07/23 05/06/24  Mannam, Praveen, MD  levothyroxine  (SYNTHROID , LEVOTHROID) 175 MCG tablet Take 175 mcg by mouth daily before breakfast.    [provider]  Menthol -Camphor (TIGER BALM ARTHRITIS RUB EX) Apply 1 application  topically as needed (for  pain).    [provider]  montelukast  (SINGULAIR ) 10 MG tablet TAKE 1 TABLET(10 MG) BY MOUTH AT BEDTIME 10/16/23   Shelah Lamar RAMAN, MD  Multiple Minerals-Vitamins (CAL-MAG-ZINC -D) TABS Take 1 tablet by mouth at bedtime.    [provider]  Multiple Vitamin (MULTIVITAMIN WITH MINERALS) TABS tablet Take 1 tablet by mouth daily. 12/15/19   Sherrill Cable Latif, DO  ondansetron  (ZOFRAN ) 4 MG tablet Take 1 tablet (4 mg total) by mouth every 6 (six) hours. Patient taking differently: Take 4 mg by mouth every 6 (six) hours as needed for nausea or vomiting. 01/31/23   Kingsley, Victoria K, DO  oxyCODONE -acetaminophen  (PERCOCET) 10-325 MG tablet Take 1 tablet by mouth 3 (three) times daily as needed. 12/30/23   [provider]  OXYGEN  Inhale 2 L/min into the lungs continuous.    [provider]  pantoprazole  (PROTONIX ) 40 MG tablet Take 40 mg by mouth daily before breakfast. Patient taking differently: Take 40 mg by mouth 2 (two) times daily.    [provider]  Polyethyl Glyc-Propyl Glyc PF (SYSTANE ULTRA PF) 0.4-0.3 % SOLN Place 2 drops into both eyes 3 (three) times daily as needed (for dryness).    [provider]  polyethylene glycol powder (GLYCOLAX /MIRALAX ) 17 GM/SCOOP powder Take 17 g by mouth 2 (two) times daily. Dissolve 1 capful (17g) in 4-8 ounces of liquid and take by mouth twice daily. 01/05/24   Adhikari, Amrit, MD  potassium chloride  SA (KLOR-CON  M) 20 MEQ tablet Take 1 tablet (20 mEq total) by mouth 2 (two) times daily. 12/15/23   Milford, Harlene HERO, FNP  predniSONE  (DELTASONE ) 2.5 MG tablet Take 1 tablet (2.5 mg total) by mouth daily with breakfast. 05/07/23   Mannam, Praveen, MD  roflumilast  (DALIRESP ) 500 MCG TABS tablet Take 1 tablet (500 mcg total) by mouth daily. 09/26/23   Hope Almarie ORN, NP  rOPINIRole  (REQUIP ) 1 MG tablet Take 1 tablet (1 mg total) by mouth in the morning and at bedtime. Patient taking differently: Take 1 mg by mouth  every morning. 01/23/20   Cheryle Page, MD  rosuvastatin  (CRESTOR ) 10 MG tablet Take 10 mg by mouth at bedtime.  08/08/16   [provider]  spironolactone  (ALDACTONE ) 25 MG tablet Take 1 tablet (25 mg total) by mouth daily. Patient taking differently: Take 12.5 mg by mouth daily. 02/16/16   Lesia Ozell Barter, PA-C  torsemide  (DEMADEX ) 20 MG tablet Take 40 mg alternating with 20 mg daily. Patient taking differently: Take 20-40 mg by mouth See admin instructions. Take 20 mg  by mouth in the morning, ALTERNATING WITH 40 mg EVERY OTHER DAY 12/24/23   Glena Harlene HERO, FNP  traZODone  (DESYREL ) 50 MG tablet Take 50 mg by mouth at bedtime.    [provider]                                                                                                                                    Past Surgical History Past Surgical History:  Procedure Laterality Date   AMPUTATION Right 08/23/2022   Procedure: RIGHT FOOT FIFTH RAY AMPUTATION;  Surgeon: Harden Jerona GAILS, MD;  Location: [facility] OR;  Service: Orthopedics;  Laterality: Right;   AMPUTATION Right 09/04/2022   Procedure: RIGHT BELOW KNEE AMPUTATION;  Surgeon: Harden Jerona GAILS, MD;  Location: [facility] OR;  Service: Orthopedics;  Laterality: Right;   AORTIC VALVE REPLACEMENT N/A 08/22/2015   Procedure: AORTIC VALVE REPLACEMENT (AVR);  Surgeon: Maude Fleeta Ochoa, MD;  Location: [facility] OR;  Service: Open Heart Surgery;  Laterality: N/A;   ATRIAL FIBRILLATION ABLATION  01/30/2012   PVI by Dr. Kelsie   ATRIAL FIBRILLATION ABLATION N/A 01/31/2012   Procedure: ATRIAL FIBRILLATION ABLATION;  Surgeon: Lynwood Kelsie, MD;  Location: [facility] CATH LAB;  Service: Cardiovascular;  Laterality: N/A;   ATRIAL FIBRILLATION ABLATION N/A 04/19/2020   Procedure: ATRIAL FIBRILLATION ABLATION;  Surgeon: Kelsie Lynwood, MD;  Location: [facility] INVASIVE CV LAB;  Service: Cardiovascular;  Laterality: N/A;   BACK SURGERY     X 3   BIOPSY  01/01/2022   Procedure: BIOPSY;  Surgeon:  Elicia Claw, MD;  Location: [facility] ENDOSCOPY;  Service: Gastroenterology;;   CARDIAC CATHETERIZATION N/A 08/09/2015   Procedure: Right/Left Heart Cath and Coronary Angiography;  Surgeon: Peter M Jordan, MD;  Location: [facility] INVASIVE CV LAB;  Service: Cardiovascular;  Laterality: N/A;   CARDIAC CATHETERIZATION N/A 02/05/2016   Procedure: Right/Left Heart Cath and Coronary Angiography;  Surgeon: Candyce GORMAN Reek, MD;  Location: [facility] INVASIVE CV LAB;  Service: Cardiovascular;  Laterality: N/A;   CARDIAC CATHETERIZATION N/A 04/17/2016   Procedure: Right Heart Cath;  Surgeon: Toribio JONELLE Fuel, MD;  Location: [facility] INVASIVE CV LAB;  Service: Cardiovascular;  Laterality: N/A;   CARDIOVASCULAR STRESS TEST  03/12/2002   EF 48%, NO EVIDENCE OF ISCHEMIA   CARDIOVERSION  01/2012; 03/31/2012   CARDIOVERSION N/A 02/25/2012   Procedure: CARDIOVERSION;  Surgeon: Lynwood Kelsie, MD;  Location: [facility] CATH LAB;  Service: Cardiovascular;  Laterality: N/A;   CARDIOVERSION N/A 03/31/2012   Procedure: CARDIOVERSION;  Surgeon: Lynwood Kelsie, MD;  Location: [facility] CATH LAB;  Service: Cardiovascular;  Laterality: N/A;   CARDIOVERSION N/A 11/23/2015   Procedure: CARDIOVERSION;  Surgeon: Ezra GORMAN Shuck, MD;  Location: [facility] ENDOSCOPY;  Service: Cardiovascular;  Laterality: N/A;   CARDIOVERSION N/A 04/08/2016   Procedure: CARDIOVERSION;  Surgeon: Toribio JONELLE Fuel, MD;  Location: [facility] ENDOSCOPY;  Service: Cardiovascular;  Laterality: N/A;   CARDIOVERSION N/A 09/14/2018   Procedure: CARDIOVERSION;  Surgeon:  Delford Maude BROCKS, MD;  Location: [facility] ENDOSCOPY;  Service: Cardiovascular;  Laterality: N/A;   CARDIOVERSION N/A 10/19/2019   Procedure: CARDIOVERSION;  Surgeon: Cherrie Toribio SAUNDERS, MD;  Location: [facility] ENDOSCOPY;  Service: Cardiovascular;  Laterality: N/A;   CARDIOVERSION N/A 02/25/2020   Procedure: CARDIOVERSION;  Surgeon: Okey Vina GAILS, MD;  Location: [facility] ENDOSCOPY;  Service: Cardiovascular;  Laterality: N/A;   CARDIOVERSION N/A  05/25/2020   Procedure: CARDIOVERSION;  Surgeon: Pietro Redell RAMAN, MD;  Location: [facility] ENDOSCOPY;  Service: Cardiovascular;  Laterality: N/A;   CARDIOVERSION N/A 07/10/2020   Procedure: CARDIOVERSION;  Surgeon: Jeffrie Oneil BROCKS, MD;  Location: [facility] ENDOSCOPY;  Service: Cardiovascular;  Laterality: N/A;   CLIPPING OF ATRIAL APPENDAGE N/A 08/22/2015   Procedure: CLIPPING OF ATRIAL APPENDAGE;  Surgeon: Maude Fleeta Ochoa, MD;  Location: [facility] OR;  Service: Open Heart Surgery;  Laterality: N/A;   COLONOSCOPY WITH PROPOFOL  N/A 01/01/2022   Procedure: COLONOSCOPY WITH PROPOFOL ;  Surgeon: Elicia Claw, MD;  Location: [facility] ENDOSCOPY;  Service: Gastroenterology;  Laterality: N/A;   DOPPLER ECHOCARDIOGRAPHY  03/11/2002   EF 70-75%   FINGER SURGERY Left    Middle finger   FINGER TENDON REPAIR  1980's   right little finger (03/31/2012)   FLEXIBLE SIGMOIDOSCOPY N/A 12/30/2021   Procedure: FLEXIBLE SIGMOIDOSCOPY;  Surgeon: Dianna Specking, MD;  Location: [facility] ENDOSCOPY;  Service: Gastroenterology;  Laterality: N/A;   INSERT / REPLACE / REMOVE PACEMAKER     St Jude   IR RADIOLOGIST EVAL & MGMT  05/06/2019   IR RADIOLOGIST EVAL & MGMT  10/06/2019   IR RADIOLOGIST EVAL & MGMT  10/31/2020   IR RADIOLOGIST EVAL & MGMT  01/18/2021   IR RADIOLOGIST EVAL & MGMT  03/08/2021   IR RADIOLOGIST EVAL & MGMT  06/13/2021   KIDNEY SURGERY Left    LEFT AND RIGHT HEART CATHETERIZATION WITH CORONARY ANGIOGRAM N/A 10/27/2012   Procedure: LEFT AND RIGHT HEART CATHETERIZATION WITH CORONARY ANGIOGRAM;  Surgeon: Erick SAUNDERS Bergamo, MD;  Location: [facility] CATH LAB;  Service: Cardiovascular;  Laterality: N/A;   LUMBAR DISC SURGERY  1980's X2;  2000's   MAZE N/A 08/22/2015   Procedure: MAZE;  Surgeon: Maude Fleeta Ochoa, MD;  Location: [facility] OR;  Service: Open Heart Surgery;  Laterality: N/A;   PACEMAKER INSERTION  03/31/2012   STJ Accent DR pacemaker implanted by Dr Kelsie   PERMANENT PACEMAKER INSERTION N/A 03/31/2012   Procedure: PERMANENT  PACEMAKER INSERTION;  Surgeon: Lynwood Kelsie, MD;  Location: [facility] CATH LAB;  Service: Cardiovascular;  Laterality: N/A;   PPM GENERATOR CHANGEOUT N/A 08/08/2020   Procedure: PPM GENERATOR CHANGEOUT;  Surgeon: Kelsie Lynwood, MD;  Location: [facility] INVASIVE CV LAB;  Service: Cardiovascular;  Laterality: N/A;   RADIOLOGY WITH ANESTHESIA Left 02/14/2021   Procedure: RADIOLOGY WITH ANESTHESIA LEFT RENAL CRYOABLATION;  Surgeon: Luverne Aran, MD;  Location: [facility] ORS;  Service: Radiology;  Laterality: Left;   STUMP REVISION Right 10/09/2022   Procedure: REVISION RIGHT BELOW KNEE AMPUTATION;  Surgeon: Harden Jerona GAILS, MD;  Location: [facility] OR;  Service: Orthopedics;  Laterality: Right;   STUMP REVISION Right 11/16/2022   Procedure: REVISION RIGHT BELOW KNEE AMPUTATION;  Surgeon: Harden Jerona GAILS, MD;  Location: [facility] OR;  Service: Orthopedics;  Laterality: Right;   TEE WITHOUT CARDIOVERSION  01/30/2012   Procedure: TRANSESOPHAGEAL ECHOCARDIOGRAM (TEE);  Surgeon: Ezra RAMAN Shuck, MD;  Location: [facility] ENDOSCOPY;  Service: Cardiovascular;  Laterality: N/A;  ablation next day   TEE WITHOUT CARDIOVERSION N/A 08/22/2015   Procedure: TRANSESOPHAGEAL ECHOCARDIOGRAM (TEE);  Surgeon: Maude Fleeta Ochoa, MD;  Location: [facility] OR;  Service: Open Heart Surgery;  Laterality: N/A;   TEE WITHOUT CARDIOVERSION N/A 10/19/2019   Procedure: TRANSESOPHAGEAL ECHOCARDIOGRAM (TEE);  Surgeon: Cherrie Toribio SAUNDERS, MD;  Location: [facility] ENDOSCOPY;  Service: Cardiovascular;  Laterality: N/A;   US  ECHOCARDIOGRAPHY  08/07/2009   EF 55-60%   Family History Family History  Problem Relation Age of Onset   Heart failure Mother    Stroke Father    Neuropathy Neg Hx     Social History Social History[1] Allergies Cephalexin , Meloxicam, and Vancomycin   Review of Systems A thorough review of systems was obtained and all systems are negative except as noted in the HPI and PMH.   Physical Exam Vital Signs  I have reviewed the triage vital signs BP 127/79 (BP  Location: Right Arm)   Pulse (!) 59   Temp (!) 97.5 F (36.4 C) (Oral)   Resp 15   SpO2 96%  Physical Exam Vitals and nursing note reviewed.  Constitutional:      General: He is not in acute distress.    Appearance: He is well-developed. He is not ill-appearing.  HENT:     Head: Normocephalic and atraumatic.     Right Ear: External ear normal.     Left Ear: External ear normal.     Mouth/Throat:     Mouth: Mucous membranes are moist.  Eyes:     General: No scleral icterus. Cardiovascular:     Rate and Rhythm: Normal rate and regular rhythm.     Pulses: Normal pulses.     Heart sounds: Normal heart sounds.  Pulmonary:     Effort: Pulmonary effort is normal. No respiratory distress.     Breath sounds: Normal breath sounds. Decreased air movement present.     Comments: Trace wheeze Abdominal:     General: Abdomen is flat.     Palpations: Abdomen is soft.     Tenderness: There is generalized abdominal tenderness.  Genitourinary:      Comments: Left scrotal/testicular pain Musculoskeletal:     Cervical back: No rigidity.     Right lower leg: No edema.     Left lower leg: No edema.       Legs:     Comments: Pain along medial aspect of left thigh, trace pedal edema  Skin:    General: Skin is warm and dry.     Capillary Refill: Capillary refill takes less than 2 seconds.  Neurological:     Mental Status: He is alert.  Psychiatric:        Mood and Affect: Mood normal.        Behavior: Behavior normal.     ED Results and Treatments Labs (all labs ordered are listed, but only abnormal results are displayed) Labs Reviewed  CBC WITH DIFFERENTIAL/PLATELET - Abnormal; Notable for the following components:      Result Value   RBC 3.57 (*)    Hemoglobin 11.5 (*)    HCT 36.5 (*)    MCV 102.2 (*)    RDW 18.2 (*)    Platelets 132 (*)    All other components within normal limits  COMPREHENSIVE METABOLIC PANEL WITH GFR - Abnormal; Notable for the following components:    Chloride 97 (*)    Glucose, Bld 201 (*)    BUN 40 (*)    Creatinine, Ser 2.08 (*)    Alkaline Phosphatase 128 (*)    GFR, Estimated 33 (*)    All other  components within normal limits  PRO BRAIN NATRIURETIC PEPTIDE - Abnormal; Notable for the following components:   Pro Brain Natriuretic Peptide 1,840.0 (*)    All other components within normal limits  TROPONIN T, HIGH SENSITIVITY - Abnormal; Notable for the following components:   Troponin T High Sensitivity 81 (*)    All other components within normal limits  TROPONIN T, HIGH SENSITIVITY - Abnormal; Notable for the following components:   Troponin T High Sensitivity 75 (*)    All other components within normal limits  RESP PANEL BY RT-PCR (RSV, FLU A&B, COVID)  RVPGX2  LIPASE, BLOOD                                                                                                                          Radiology CT Angio Chest PE W and/or Wo Contrast Result Date: 04/08/2024 CLINICAL DATA:  Concern for pulmonary embolism. Abdominal pain. History of renal cell carcinoma status post prior ablation of the left kidney. EXAM: CT ANGIOGRAPHY CHEST CT ABDOMEN AND PELVIS WITH CONTRAST TECHNIQUE: Multidetector CT imaging of the chest was performed using the standard protocol during bolus administration of intravenous contrast. Multiplanar CT image reconstructions and MIPs were obtained to evaluate the vascular anatomy. Multidetector CT imaging of the abdomen and pelvis was performed using the standard protocol during bolus administration of intravenous contrast. RADIATION DOSE REDUCTION: This exam was performed according to the departmental dose-optimization program which includes automated exposure control, adjustment of the mA and/or kV according to patient size and/or use of iterative reconstruction technique. CONTRAST:  75mL OMNIPAQUE  IOHEXOL  350 MG/ML SOLN COMPARISON:  Chest radiograph dated 04/08/2024. CT abdomen pelvis dated 01/02/2024.  FINDINGS: CTA CHEST FINDINGS Cardiovascular: There is no cardiomegaly or pericardial effusion. Retrograde flow of contrast from the right atrium into the IVC 2 suggestive of right heart dysfunction. There is coronary vascular calcification. Mechanical aortic valve. There is mild atherosclerotic calcification of the thoracic aorta. No pulmonary artery embolus identified. Mediastinum/Nodes: No hilar or mediastinal adenopathy. The esophagus is grossly unremarkable. No mediastinal fluid collection. Lungs/Pleura: No focal consolidation, pleural effusion, pneumothorax. Small scattered calcified granuloma. The central airways are patent. Musculoskeletal: Median sternotomy wires. Left pectoral pacemaker device. No acute osseous pathology. Osteopenia with degenerative changes of the spine. Review of the MIP images confirms the above findings. CT ABDOMEN and PELVIS FINDINGS No intra-abdominal free air or free fluid. Hepatobiliary: Slight irregularity of the liver contour suspicious for early changes of cirrhosis. No biliary dilatation. Small gallstone. No pericholecystic fluid or evidence of acute cholecystitis by CT. Pancreas: Unremarkable. No pancreatic ductal dilatation or surrounding inflammatory changes. Spleen: Normal in size without focal abnormality. Adrenals/Urinary Tract: The adrenal glands unremarkable. Mild left and severe right renal atrophy. Similar appearance of a 1.5 cm post ablation treatment of the left kidney. There is no hydronephrosis on either side. The visualized ureters and urinary bladder appear unremarkable. Stomach/Bowel: There is no bowel obstruction or active inflammation. The appendix is normal. Vascular/Lymphatic: Advanced aortoiliac  atherosclerotic disease. The IVC is unremarkable. No portal venous gas. There is no adenopathy. Reproductive: The prostate and seminal vesicles are grossly normal. Other: Small fat containing bilateral inguinal hernias. Small fat containing umbilical hernia.  Musculoskeletal: Osteopenia.  No acute osseous pathology. Review of the MIP images confirms the above findings. IMPRESSION: 1. No acute intrathoracic, abdominal, or pelvic pathology. No CT evidence of pulmonary artery embolus. 2. Cholelithiasis. 3. Similar appearance of a 1.5 cm post ablation treatment of the left kidney. 4.  Aortic Atherosclerosis (ICD10-I70.0). Electronically Signed   By: Vanetta Chou M.D.   On: 04/08/2024 16:22   CT ABDOMEN PELVIS W CONTRAST Result Date: 04/08/2024 CLINICAL DATA:  Concern for pulmonary embolism. Abdominal pain. History of renal cell carcinoma status post prior ablation of the left kidney. EXAM: CT ANGIOGRAPHY CHEST CT ABDOMEN AND PELVIS WITH CONTRAST TECHNIQUE: Multidetector CT imaging of the chest was performed using the standard protocol during bolus administration of intravenous contrast. Multiplanar CT image reconstructions and MIPs were obtained to evaluate the vascular anatomy. Multidetector CT imaging of the abdomen and pelvis was performed using the standard protocol during bolus administration of intravenous contrast. RADIATION DOSE REDUCTION: This exam was performed according to the departmental dose-optimization program which includes automated exposure control, adjustment of the mA and/or kV according to patient size and/or use of iterative reconstruction technique. CONTRAST:  75mL OMNIPAQUE  IOHEXOL  350 MG/ML SOLN COMPARISON:  Chest radiograph dated 04/08/2024. CT abdomen pelvis dated 01/02/2024. FINDINGS: CTA CHEST FINDINGS Cardiovascular: There is no cardiomegaly or pericardial effusion. Retrograde flow of contrast from the right atrium into the IVC 2 suggestive of right heart dysfunction. There is coronary vascular calcification. Mechanical aortic valve. There is mild atherosclerotic calcification of the thoracic aorta. No pulmonary artery embolus identified. Mediastinum/Nodes: No hilar or mediastinal adenopathy. The esophagus is grossly unremarkable. No  mediastinal fluid collection. Lungs/Pleura: No focal consolidation, pleural effusion, pneumothorax. Small scattered calcified granuloma. The central airways are patent. Musculoskeletal: Median sternotomy wires. Left pectoral pacemaker device. No acute osseous pathology. Osteopenia with degenerative changes of the spine. Review of the MIP images confirms the above findings. CT ABDOMEN and PELVIS FINDINGS No intra-abdominal free air or free fluid. Hepatobiliary: Slight irregularity of the liver contour suspicious for early changes of cirrhosis. No biliary dilatation. Small gallstone. No pericholecystic fluid or evidence of acute cholecystitis by CT. Pancreas: Unremarkable. No pancreatic ductal dilatation or surrounding inflammatory changes. Spleen: Normal in size without focal abnormality. Adrenals/Urinary Tract: The adrenal glands unremarkable. Mild left and severe right renal atrophy. Similar appearance of a 1.5 cm post ablation treatment of the left kidney. There is no hydronephrosis on either side. The visualized ureters and urinary bladder appear unremarkable. Stomach/Bowel: There is no bowel obstruction or active inflammation. The appendix is normal. Vascular/Lymphatic: Advanced aortoiliac atherosclerotic disease. The IVC is unremarkable. No portal venous gas. There is no adenopathy. Reproductive: The prostate and seminal vesicles are grossly normal. Other: Small fat containing bilateral inguinal hernias. Small fat containing umbilical hernia. Musculoskeletal: Osteopenia.  No acute osseous pathology. Review of the MIP images confirms the above findings. IMPRESSION: 1. No acute intrathoracic, abdominal, or pelvic pathology. No CT evidence of pulmonary artery embolus. 2. Cholelithiasis. 3. Similar appearance of a 1.5 cm post ablation treatment of the left kidney. 4.  Aortic Atherosclerosis (ICD10-I70.0). Electronically Signed   By: Vanetta Chou M.D.   On: 04/08/2024 16:22   VAS US  LOWER EXTREMITY VENOUS  (DVT) (7a-7p) Result Date: 04/08/2024  Lower Venous DVT Study Patient Name:  Victor PETROVIC  [name]  Date of Exam:   04/08/2024 Medical Rec #: [id number]        Accession #:    [id number] Date of Birth: 11/27/1950        Patient Gender: M Patient Age:   89 years Exam Location:  [facility] Procedure:      VAS US  LOWER EXTREMITY VENOUS (DVT) Referring Phys: JAYSON PEREYRA --------------------------------------------------------------------------------  Indications: Pain.  Risk Factors: None identified. Limitations: Poor ultrasound/tissue interface. Comparison Study: No prior studies. Performing Technologist: Cordella Collet RVT  Examination Guidelines: A complete evaluation includes B-mode imaging, spectral Doppler, color Doppler, and power Doppler as needed of all accessible portions of each vessel. Bilateral testing is considered an integral part of a complete examination. Limited examinations for reoccurring indications may be performed as noted. The reflux portion of the exam is performed with the patient in reverse Trendelenburg.  +-----+---------------+---------+-----------+----------+--------------+ RIGHTCompressibilityPhasicitySpontaneityPropertiesThrombus Aging +-----+---------------+---------+-----------+----------+--------------+ CFV  Full           Yes      No                                  +-----+---------------+---------+-----------+----------+--------------+   +---------+---------------+---------+-----------+----------+--------------+ LEFT     CompressibilityPhasicitySpontaneityPropertiesThrombus Aging +---------+---------------+---------+-----------+----------+--------------+ CFV      Full           Yes      No                                  +---------+---------------+---------+-----------+----------+--------------+ SFJ      Full                                                        +---------+---------------+---------+-----------+----------+--------------+ FV  Prox  Full                                                        +---------+---------------+---------+-----------+----------+--------------+ FV Mid   Full                                                        +---------+---------------+---------+-----------+----------+--------------+ FV DistalFull                                                        +---------+---------------+---------+-----------+----------+--------------+ PFV      Full                                                        +---------+---------------+---------+-----------+----------+--------------+ POP      Full  Yes      No                                  +---------+---------------+---------+-----------+----------+--------------+ PTV      Full                                                        +---------+---------------+---------+-----------+----------+--------------+ PERO     Full                                                        +---------+---------------+---------+-----------+----------+--------------+    Summary: RIGHT: - No evidence of common femoral vein obstruction.   LEFT: - There is no evidence of deep vein thrombosis in the lower extremity.  - No cystic structure found in the popliteal fossa.  *See table(s) above for measurements and observations.    Preliminary    US  SCROTUM W/DOPPLER Result Date: 04/08/2024 EXAM: ULTRASOUND SCROTUM/TESTICLES WITH DOPPLER FLOW EVALUATION 04/08/2024 03:29:00 PM TECHNIQUE: Duplex ultrasound using B-mode/Clevon Khader scaled imaging, Doppler spectral analysis and color flow Doppler was obtained of the testicles. COMPARISON: 09/17/2017 CLINICAL HISTORY: left testicle pain/swelling FINDINGS: RIGHT: GREY SCALE: The right testicle measures 3.8 x 3.0 x 2.7 cm. It demonstrates normal homogeneous echotexture without focal lesion. No testicular microlithiasis. DOPPLER EVALUATION: There is normal arterial and venous Doppler flow within the testicle.  VARICOCELE: No scrotal varicocele. SCROTAL SAC: Small hydrocele. EPIDIDYMIS: No acute abnormality. LEFT: GREY SCALE: The left testicle measures 4.0 x 3.1 x 1.9 cm. It demonstrates normal homogeneous echotexture without focal lesion. No testicular microlithiasis. DOPPLER EVALUATION: There is normal arterial and venous Doppler flow within the testicle. VARICOCELE: No scrotal varicocele. SCROTAL SAC: Small hydrocele. EPIDIDYMIS: Left epididymal cyst. No acute abnormality. IMPRESSION: 1. Small bilateral hydroceles. 2. Left epididymal head cyst measuring up to 1.9 cm. Electronically signed by: Lynwood Seip MD 04/08/2024 03:58 PM EST RP Workstation: [id number]   DG Chest 2 View Result Date: 04/08/2024 CLINICAL DATA:  Chest pain. EXAM: CHEST - 2 VIEW COMPARISON:  Chest radiograph dated 03/16/2024. FINDINGS: No focal consolidation, pleural effusion or pneumothorax. Enlargement of the bilateral hila, suggestive of pulmonary hypertension. Clinical correlation recommended. Stable mild cardiomegaly. 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 Left pectoral pacemaker device. No acute osseous pathology. IMPRESSION: 1. No acute cardiopulmonary process. 2. Mild cardiomegaly. Electronically Signed   By: Vanetta Chou M.D.   On: 04/08/2024 14:27    Pertinent labs & imaging results that were available during my care of the patient were reviewed by me and considered in my medical decision making (see MDM for details).  Medications Ordered in ED Medications  apixaban  (ELIQUIS ) tablet 5 mg (has no administration in time range)  bisoprolol  (ZEBETA ) tablet 5 mg (has no administration in time range)  gabapentin  (NEURONTIN ) capsule 600 mg (has no administration in time range)  levothyroxine  (SYNTHROID ) tablet 175 mcg (has no administration in time range)  pantoprazole  (PROTONIX ) EC tablet 40 mg (has no administration in time range)  rOPINIRole  (REQUIP ) tablet 1 mg (has no administration in time range)  torsemide   (DEMADEX ) tablet 20-40 mg (has no administration  in time range)  traZODone  (DESYREL ) tablet 50 mg (has no administration in time range)  oxyCODONE -acetaminophen  (PERCOCET/ROXICET) 5-325 MG per tablet 1 tablet (has no administration in time range)    And  oxyCODONE  (Oxy IR/ROXICODONE ) immediate release tablet 5 mg (has no administration in time range)  morphine  (PF) 4 MG/ML injection 4 mg (4 mg Intravenous Given 04/08/24 1455)  ondansetron  (ZOFRAN ) injection 4 mg (4 mg Intravenous Given 04/08/24 1458)  ipratropium-albuterol  (DUONEB) 0.5-2.5 (3) MG/3ML nebulizer solution 3 mL (3 mLs Nebulization Given 04/08/24 1606)  iohexol  (OMNIPAQUE ) 350 MG/ML injection 75 mL (75 mLs Intravenous Contrast Given 04/08/24 1519)  morphine  (PF) 4 MG/ML injection 4 mg (4 mg Intravenous Given 04/08/24 1639)  cyclobenzaprine  (FLEXERIL ) tablet 5 mg (5 mg Oral Given 04/08/24 1639)                                                                                                                                     Procedures Procedures  (including critical care time)  Medical Decision Making / ED Course    Medical Decision Making:    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 The complaint involves an extensive differential diagnosis and also carries with it a high risk of complications and morbidity.  Serious etiology was considered. Ddx includes but is not limited to: Epididymitis, DVT, MSK, CP exacerbation, pneumonia, acute CHF, ACS, diverticulitis UTI, nephrolithiasis, CVA  Complete initial physical exam performed, notably the patient was in no acute distress.    Reviewed and confirmed nursing documentation for past medical history, family history, social history.  Vital signs reviewed.     Intermittent chest pain Intermittent dyspnea> - trop mild elev/flat - bnp mild elev, no sig respiratory difficulty, no hypoxia. Does not appear overtly volume overloaded - no acute distress - doubt acute ACS or acute CHF exacerbation, well appearing, breathing comfortably on [facility] which is his baseline - cr similar to prior   Left sided scrotal pain > - scrotal u/s > stable  Left proximal thigh pain > - no abnormalities noted externally - he has some pedal edema - LE dupelx > neg   Clinical Course as of 04/08/24 1716  Thu Apr 08, 2024  1524 Troponin T High Sensitivity(!): 75 downtrending [SG]  1600 Duplex neg per tech [SG]    Clinical Course User Index [SG] Elnor Savant A, DO     Pt w/ chronic appearing pain, now having worsening weakness and difficulty transferring to wheelchair as of late. Family is interested in rehab, will place pt under toc boarder status and have PT eval. Plan for placement at this time                 Additional  history obtained: -Additional history obtained from family -External records from outside source obtained and reviewed including: Chart review including previous notes, labs, imaging, consultation notes including  Home medications, prior ER evaluation   Lab Tests: -I ordered, reviewed, and interpreted labs.   The pertinent results include:   Labs Reviewed  CBC WITH DIFFERENTIAL/PLATELET - Abnormal; Notable for the following components:      Result Value   RBC 3.57 (*)    Hemoglobin 11.5 (*)    HCT 36.5 (*)    MCV 102.2 (*)    RDW 18.2 (*)    Platelets 132 (*)    All other components within normal limits  COMPREHENSIVE METABOLIC PANEL WITH GFR - Abnormal; Notable for the following components:   Chloride 97 (*)    Glucose, Bld 201 (*)    BUN 40 (*)    Creatinine, Ser 2.08 (*)    Alkaline Phosphatase 128 (*)    GFR, Estimated 33 (*)    All other components within normal limits  PRO BRAIN NATRIURETIC PEPTIDE -  Abnormal; Notable for the following components:   Pro Brain Natriuretic Peptide 1,840.0 (*)    All other components within normal limits  TROPONIN T, HIGH SENSITIVITY - Abnormal; Notable for the following components:   Troponin T High Sensitivity 81 (*)    All other components within normal limits  TROPONIN T, HIGH SENSITIVITY - Abnormal; Notable for the following components:   Troponin T High Sensitivity 75 (*)    All other components within normal limits  RESP PANEL BY RT-PCR (RSV, FLU A&B, COVID)  [id number]  LIPASE, BLOOD    Notable for labs are stable  EKG   EKG Interpretation Date/Time:  Thursday April 08 2024 12:29:58 EST Ventricular Rate:  66 PR Interval:    QRS Duration:  220 QT Interval:  513 QTC Calculation: 538 R Axis:   -79  Text Interpretation: ventricular paced. motion artifact but no sig change from previous Confirmed by Armenta Canning [phone]) on 04/08/2024 12:37:45 PM         Imaging Studies ordered: I ordered imaging studies including CTPE CTAP scrotal us , duplex us , cxr I independently visualized the following imaging with scope of interpretation limited to determining acute life threatening conditions related to emergency care; findings noted above I agree with the radiologist interpretation If any imaging was obtained with contrast I closely monitored patient for any possible adverse reaction a/w contrast administration in the emergency department   Medicines ordered and prescription drug management: Meds ordered this encounter  Medications   morphine  (PF) 4 MG/ML injection 4 mg   ondansetron  (ZOFRAN ) injection 4 mg   ipratropium-albuterol  (DUONEB) 0.5-2.5 (3) MG/3ML nebulizer solution 3 mL   iohexol  (OMNIPAQUE ) 350 MG/ML injection 75 mL   morphine  (PF) 4 MG/ML injection 4 mg   cyclobenzaprine  (FLEXERIL ) tablet 5 mg   apixaban  (ELIQUIS ) tablet 5 mg   bisoprolol  (ZEBETA ) tablet 5 mg   gabapentin  (NEURONTIN ) capsule 600 mg   levothyroxine   (SYNTHROID ) tablet 175 mcg   DISCONTD: oxyCODONE -acetaminophen  (PERCOCET) 10-325 MG per tablet 1 tablet    Refill:  0   pantoprazole  (PROTONIX ) EC tablet 40 mg   rOPINIRole  (REQUIP ) tablet 1 mg   torsemide  (DEMADEX ) tablet 20-40 mg   traZODone  (DESYREL ) tablet 50 mg   AND Linked Order Group    oxyCODONE -acetaminophen  (PERCOCET/ROXICET) 5-325 MG per tablet 1 tablet     Refill:  0    oxyCODONE  (Oxy IR/ROXICODONE ) immediate release tablet 5 mg  Refill:  0    -I have reviewed the patients home medicines and have made adjustments as needed   Consultations Obtained: na   Cardiac Monitoring: The patient was maintained on a cardiac monitor.  I personally viewed and interpreted the cardiac monitored which showed an underlying rhythm of: nsr Continuous pulse oximetry interpreted by myself, 98% on 2LNC (baseline).    Social Determinants of Health:  Diagnosis or treatment significantly limited by social determinants of health: former smoker   Reevaluation: After the interventions noted above, I reevaluated the patient and found that they have improved  Co morbidities that complicate the patient evaluation  Past Medical History:  Diagnosis Date   Aortic stenosis, mild    Arthritis of hand    just a little bit in both hands (03/31/2012)   Asthma    little bit (03/31/2012)   Atrial fibrillation (HCC)    dx '04; DCCV '04, placed on flecainide, failed DCCV 04/2010, flecainide stopped; s/p successful A.fib ablation 01/31/12   CHF (congestive heart failure) (HCC)    Controlled type 2 diabetes mellitus without complication, without long-term current use of insulin  (HCC) 12/13/2019   COPD (chronic obstructive pulmonary disease) (HCC)    DJD (degenerative joint disease)    Fatty liver    Fibromyalgia    GERD (gastroesophageal reflux disease)    Hyperlipidemia    Hypertension    Hypothyroidism    S/P radiation   Left atrial enlargement    LA size 45mm by echo 11/21/11   Mitral  regurgitation    trivial   Obstructive sleep apnea    mild by sleep study 2013; pt stated he does not have a machine because it wasn't bad enough for him to have one   Pacemaker 03/31/2012   Pneumonia    last time 2023   Restless leg syndrome    Second degree AV block    Splenomegaly       Dispostion: Disposition decision including need for hospitalization was considered, and patient disposition pending at time of sign out.    Final Clinical Impression(s) / ED Diagnoses Final diagnoses:  Hydrocele, unspecified hydrocele type  Pain of left lower extremity  Generalized weakness  Chronic pain of left lower extremity         [1]  Social History Tobacco Use   Smoking status: Former    Current packs/day: 0.00    Average packs/day: 1 pack/day for 30.2 years (30.2 ttl pk-yrs)    Types: Cigarettes    Start date: 89    Quit date: 07/18/1991    Years since quitting: 32.7    Passive exposure: Never   Smokeless tobacco: Never  Vaping Use   Vaping status: Never Used  Substance Use Topics   Alcohol  use: Not Currently    Comment: occasional   Drug use: Yes    Types: Marijuana    Comment: Marijuana- for nausea     Elnor Jayson LABOR, DO 04/08/24 1716

## 2024-04-09 ENCOUNTER — Encounter (HOSPITAL_COMMUNITY): Payer: Self-pay | Admitting: Internal Medicine

## 2024-04-09 ENCOUNTER — Observation Stay (HOSPITAL_COMMUNITY)

## 2024-04-09 DIAGNOSIS — D539 Nutritional anemia, unspecified: Secondary | ICD-10-CM | POA: Diagnosis present

## 2024-04-09 DIAGNOSIS — I5033 Acute on chronic diastolic (congestive) heart failure: Secondary | ICD-10-CM | POA: Diagnosis not present

## 2024-04-09 LAB — CBC WITH DIFFERENTIAL/PLATELET
Abs Immature Granulocytes: 0.02 K/uL (ref 0.00–0.07)
Basophils Absolute: 0 K/uL (ref 0.0–0.1)
Basophils Relative: 0 %
Eosinophils Absolute: 0.1 K/uL (ref 0.0–0.5)
Eosinophils Relative: 1 %
HCT: 32.3 % — ABNORMAL LOW (ref 39.0–52.0)
Hemoglobin: 9.9 g/dL — ABNORMAL LOW (ref 13.0–17.0)
Immature Granulocytes: 0 %
Lymphocytes Relative: 6 %
Lymphs Abs: 0.4 K/uL — ABNORMAL LOW (ref 0.7–4.0)
MCH: 32.5 pg (ref 26.0–34.0)
MCHC: 30.7 g/dL (ref 30.0–36.0)
MCV: 105.9 fL — ABNORMAL HIGH (ref 80.0–100.0)
Monocytes Absolute: 0.7 K/uL (ref 0.1–1.0)
Monocytes Relative: 11 %
Neutro Abs: 5.5 K/uL (ref 1.7–7.7)
Neutrophils Relative %: 82 %
Platelets: 106 K/uL — ABNORMAL LOW (ref 150–400)
RBC: 3.05 MIL/uL — ABNORMAL LOW (ref 4.22–5.81)
RDW: 18.3 % — ABNORMAL HIGH (ref 11.5–15.5)
WBC: 6.7 K/uL (ref 4.0–10.5)
nRBC: 0 % (ref 0.0–0.2)

## 2024-04-09 LAB — ECHOCARDIOGRAM COMPLETE
AR max vel: 2.43 cm2
AV Area VTI: 2.25 cm2
AV Area mean vel: 2.35 cm2
AV Mean grad: 7 mmHg
AV Peak grad: 13.5 mmHg
Ao pk vel: 1.84 m/s
Area-P 1/2: 4.8 cm2
Calc EF: 54.3 %
MV M vel: 3.64 m/s
MV Peak grad: 53 mmHg
S' Lateral: 3.4 cm
Single Plane A2C EF: 54.8 %
Single Plane A4C EF: 51.1 %

## 2024-04-09 LAB — COMPREHENSIVE METABOLIC PANEL WITH GFR
ALT: 23 U/L (ref 0–44)
AST: 26 U/L (ref 15–41)
Albumin: 3.8 g/dL (ref 3.5–5.0)
Alkaline Phosphatase: 120 U/L (ref 38–126)
Anion gap: 8 (ref 5–15)
BUN: 30 mg/dL — ABNORMAL HIGH (ref 8–23)
CO2: 32 mmol/L (ref 22–32)
Calcium: 8.8 mg/dL — ABNORMAL LOW (ref 8.9–10.3)
Chloride: 104 mmol/L (ref 98–111)
Creatinine, Ser: 1.63 mg/dL — ABNORMAL HIGH (ref 0.61–1.24)
GFR, Estimated: 44 mL/min — ABNORMAL LOW (ref >60)
Glucose, Bld: 131 mg/dL — ABNORMAL HIGH (ref 70–99)
Potassium: 3.8 mmol/L (ref 3.5–5.1)
Sodium: 143 mmol/L (ref 135–145)
Total Bilirubin: 0.8 mg/dL (ref 0.0–1.2)
Total Protein: 6.9 g/dL (ref 6.5–8.1)

## 2024-04-09 LAB — TROPONIN T, HIGH SENSITIVITY
Troponin T High Sensitivity: 65 ng/L — ABNORMAL HIGH (ref 0–19)
Troponin T High Sensitivity: 73 ng/L — ABNORMAL HIGH (ref 0–19)

## 2024-04-09 LAB — MAGNESIUM: Magnesium: 2.2 mg/dL (ref 1.7–2.4)

## 2024-04-09 MED ORDER — CYCLOBENZAPRINE HCL 5 MG PO TABS
2.5000 mg | ORAL_TABLET | Freq: Two times a day (BID) | ORAL | Status: DC | PRN
  Administered 2024-04-12 (×2): 5 mg via ORAL
  Filled 2024-04-09 (×2): qty 1

## 2024-04-09 MED ORDER — EMPAGLIFLOZIN 10 MG PO TABS
10.0000 mg | ORAL_TABLET | Freq: Every day | ORAL | Status: DC
  Administered 2024-04-09 – 2024-04-13 (×5): 10 mg via ORAL
  Filled 2024-04-09 (×5): qty 1

## 2024-04-09 MED ORDER — MORPHINE SULFATE (PF) 4 MG/ML IV SOLN
4.0000 mg | INTRAVENOUS | Status: DC | PRN
  Administered 2024-04-09 – 2024-04-10 (×2): 4 mg via INTRAVENOUS
  Filled 2024-04-09 (×2): qty 1

## 2024-04-09 MED ORDER — FUROSEMIDE 10 MG/ML IJ SOLN
40.0000 mg | Freq: Every day | INTRAMUSCULAR | Status: DC
  Administered 2024-04-10: 40 mg via INTRAVENOUS
  Filled 2024-04-09: qty 4

## 2024-04-09 MED ORDER — ROFLUMILAST 500 MCG PO TABS
500.0000 ug | ORAL_TABLET | Freq: Every day | ORAL | Status: DC
  Administered 2024-04-10 – 2024-04-14 (×5): 500 ug via ORAL
  Filled 2024-04-09 (×6): qty 1

## 2024-04-09 MED ORDER — ALLOPURINOL 300 MG PO TABS
300.0000 mg | ORAL_TABLET | Freq: Every day | ORAL | Status: DC
  Administered 2024-04-09 – 2024-04-14 (×6): 300 mg via ORAL
  Filled 2024-04-09 (×6): qty 1

## 2024-04-09 MED ORDER — PREDNISONE 5 MG PO TABS
2.5000 mg | ORAL_TABLET | Freq: Every day | ORAL | Status: DC
  Administered 2024-04-10 – 2024-04-14 (×5): 2.5 mg via ORAL
  Filled 2024-04-09 (×5): qty 1

## 2024-04-09 MED ORDER — FUROSEMIDE 10 MG/ML IJ SOLN
60.0000 mg | Freq: Once | INTRAMUSCULAR | Status: DC
  Filled 2024-04-09: qty 8

## 2024-04-09 MED ORDER — GABAPENTIN 300 MG PO CAPS
600.0000 mg | ORAL_CAPSULE | Freq: Two times a day (BID) | ORAL | Status: DC
  Administered 2024-04-09 – 2024-04-10 (×2): 600 mg via ORAL
  Filled 2024-04-09 (×2): qty 2

## 2024-04-09 MED ORDER — ROSUVASTATIN CALCIUM 10 MG PO TABS
10.0000 mg | ORAL_TABLET | Freq: Every day | ORAL | Status: DC
  Administered 2024-04-09 – 2024-04-13 (×5): 10 mg via ORAL
  Filled 2024-04-09 (×5): qty 1

## 2024-04-09 MED ORDER — BUDESON-GLYCOPYRROL-FORMOTEROL 160-9-4.8 MCG/ACT IN AERO
2.0000 | INHALATION_SPRAY | Freq: Two times a day (BID) | RESPIRATORY_TRACT | Status: DC
  Administered 2024-04-09 – 2024-04-14 (×9): 2 via RESPIRATORY_TRACT
  Filled 2024-04-09: qty 5.9

## 2024-04-09 MED ORDER — LEVOTHYROXINE SODIUM 50 MCG PO TABS
175.0000 ug | ORAL_TABLET | Freq: Every day | ORAL | Status: DC
  Administered 2024-04-10 – 2024-04-14 (×5): 175 ug via ORAL
  Filled 2024-04-09 (×5): qty 1

## 2024-04-09 MED ORDER — FUROSEMIDE 10 MG/ML IJ SOLN
40.0000 mg | Freq: Once | INTRAMUSCULAR | Status: AC
  Administered 2024-04-09: 40 mg via INTRAVENOUS

## 2024-04-09 MED ORDER — ACETAMINOPHEN 325 MG PO TABS
650.0000 mg | ORAL_TABLET | Freq: Four times a day (QID) | ORAL | Status: DC | PRN
  Administered 2024-04-09 – 2024-04-12 (×6): 650 mg via ORAL
  Filled 2024-04-09 (×6): qty 2

## 2024-04-09 MED ORDER — POTASSIUM CHLORIDE CRYS ER 20 MEQ PO TBCR
20.0000 meq | EXTENDED_RELEASE_TABLET | Freq: Two times a day (BID) | ORAL | Status: DC
  Administered 2024-04-09 – 2024-04-14 (×10): 20 meq via ORAL
  Filled 2024-04-09 (×9): qty 1

## 2024-04-09 MED ORDER — POLYETHYLENE GLYCOL 3350 17 G PO PACK: 17.0000 g | PACK | ORAL | Status: DC | PRN

## 2024-04-09 MED ORDER — SPIRONOLACTONE 12.5 MG HALF TABLET
12.5000 mg | ORAL_TABLET | Freq: Every day | ORAL | Status: DC
  Administered 2024-04-10 – 2024-04-14 (×5): 12.5 mg via ORAL
  Filled 2024-04-09 (×6): qty 1

## 2024-04-09 MED ORDER — COLCHICINE 0.6 MG PO TABS: 0.6000 mg | ORAL_TABLET | Freq: Every day | ORAL | Status: DC | PRN

## 2024-04-09 MED ORDER — MONTELUKAST SODIUM 10 MG PO TABS
10.0000 mg | ORAL_TABLET | Freq: Every evening | ORAL | Status: DC
  Administered 2024-04-09 – 2024-04-13 (×5): 10 mg via ORAL
  Filled 2024-04-09 (×5): qty 1

## 2024-04-09 MED ORDER — ACETAMINOPHEN 650 MG RE SUPP: 650.0000 mg | Freq: Four times a day (QID) | RECTAL | Status: DC | PRN

## 2024-04-09 MED ORDER — IPRATROPIUM-ALBUTEROL 0.5-2.5 (3) MG/3ML IN SOLN
3.0000 mL | Freq: Four times a day (QID) | RESPIRATORY_TRACT | Status: DC | PRN
  Administered 2024-04-10: 3 mL via RESPIRATORY_TRACT
  Filled 2024-04-09: qty 3

## 2024-04-09 NOTE — Progress Notes (Signed)
 Heart Failure Navigator Progress Note  Assessed for Heart & Vascular TOC clinic readiness.   Patient is already established with the advanced heart failure team, patient of Dr. Bensimhon.  Will sign off.  Duwaine Plant, PharmD, BCPS Heart Failure Stewardship Pharmacist Phone [phone]

## 2024-04-09 NOTE — ED Notes (Signed)
Pure wick placed on pt for urinary incontinence

## 2024-04-09 NOTE — ED Provider Notes (Signed)
 Received care of patient from previous providers.  Briefly is a 73 year old male with a history of A-fib, CHF, who presented with concern for chest pain rating to the left arm, dyspnea, fatigue, generalized weakness, orthopnea, abdominal and scrotal pain.  He had workup yesterday to include CT PE study, CT abdomen pelvis, scrotal US , and given his debility, a plan was made for placement in a facility.  On my evaluation this morning, he reports progressive dyspnea on exertion over the last few weeks, more generalized weakness over the last few weeks and increasing chest pain.  His troponins are more elevated than they have been in the past with levels of 81 compared to 20, and a level of 14 on most recent check.  His proBNP is elevated to greater than 1800, compared to regular BNP of 190 in the past.  I think is likely his increase shortness of breath, weakness, chest pain and secondary to congestive heart failure exacerbation.  Will admit to the hospital for continued care.  Ordered 60 mg of IV Lasix .   Dreama Longs, MD 04/09/24 [phone]

## 2024-04-09 NOTE — H&P (Signed)
 History and Physical    Patient: [name] FMW:[id number] DOB: 02/23/1951 DOA: 04/08/2024 DOS: the patient was seen and examined on 04/09/2024 PCP: Larnell Hamilton, MD  Patient coming from: Home  Chief Complaint:  Chief Complaint  Patient presents with   Chest Pain   HPI: ALVA BROXSON is a 73 y.o. male with medical history significant of mild aortic stenosis, osteoarthritis of the hand, asthma, atrial fibrillation, asthma/COPD, DDD, fatty liver, fibromyalgia, GERD, hyperlipidemia, hypothyroidism, history of intractable abdominal pain cannabis dependence, previous morbid obesity, restless leg syndrome, cervical spinal stenosis, hypertension, first-degree AV block, second-degree AV block, history of pacemaker placement, chronic diastolic heart failure who was seen yesterday for chest pain in the emergency department, seen by [facility] team to arrange discharge, but the hospitalist service was called due to the patient having progressively worse dyspnea for the last few weeks associated with orthopnea, intermittent episodes of precordial chest pain, sharp in nature and nonradiated. He denied fever, chills, rhinorrhea, sore throat, wheezing or hemoptysis. No abdominal pain, nausea, emesis, diarrhea, constipation, melena or hematochezia.  No flank pain, dysuria, frequency or hematuria.  No polyuria, polydipsia, polyphagia or blurred vision.   Lab work: CBC showed a white count 6.2, hemoglobin 11.5 g/dL with an MCV of 897.7 fL and platelets 132.  CMP showed chloride 97 mmol/L, the rest of the electrolytes were normal.  Alkaline phosphatase was 128 units/L, the rest of the hepatic functions were normal.  Glucose 201, BUN 40 and creatinine 2.08 mg/dL.  Troponin was 81 then 75 ng/L and proBNP 1840.0 pg/mL.  Lipase was normal.  Negative coronavirus, influenza and RSV PCR.  Imaging: 2 view chest radiograph with mild cardiomegaly but no acute process.  US  scrotum/testicle with Doppler flow showing small  bilateral hydrocele use.  Left epididymal head cyst measuring up to 1.9 cm.  CTA chest with no acute intrathoracic, abdominal or pelvic pathology no CT evidence of PE.  Cholelithiasis.  Similar appearance of a 1.5 cm post ablation treatment of the left kidney.  Aortic atherosclerosis.  ED course: Initial vital signs were temperature 97.2 F, pulse 63, respirations 16, BP 139/73 mmHg and O2 sat 96% on room air.  The patient received morphine  4 mg IVP x 2, ondansetron  4 mg IVP, DuoNeb, furosemide  40 mg IVP and cyclobenzaprine  5 mg p.o. x 1 dose.   Review of Systems: As mentioned in the history of present illness. All other systems reviewed and are negative. Past Medical History:  Diagnosis Date   Aortic stenosis, mild    Arthritis of hand    just a little bit in both hands (03/31/2012)   Asthma    little bit (03/31/2012)   Atrial fibrillation (HCC)    dx '04; DCCV '04, placed on flecainide, failed DCCV 04/2010, flecainide stopped; s/p successful A.fib ablation 01/31/12   AV block, 1st degree 02/01/2012   CHF (congestive heart failure) (HCC)    Controlled type 2 diabetes mellitus without complication, without long-term current use of insulin  (HCC) 12/13/2019   COPD (chronic obstructive pulmonary disease) (HCC)    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   Quit smoking 1993  - PFT's  07/2015  FEV1 1.01 (56 % ) ratio 0.70  p 18 % improvement from saba p ? prior to study with DLCO  17.77 (54%) corrects to 3.67 (79%)  for alv volume and FV curve mild concavity       DJD (degenerative joint disease)    Dyslipidemia 10/18/2022   Fatty liver  Fibromyalgia    GERD (gastroesophageal reflux disease)    Hyperlipidemia    Hypertension    Hypothyroidism    S/P radiation   Intractable abdominal pain 01/02/2024   Intrinsic asthma 10/13/2013   Left atrial enlargement    LA size 45mm by echo 11/21/11   Marijuana dependence (HCC) 06/09/2021   Mitral regurgitation    trivial   Morbid obesity (HCC) 01/02/2024    Obstructive sleep apnea    mild by sleep study 2013; pt stated he does not have a machine because it wasn't bad enough for him to have one   Pacemaker 03/31/2012   Pneumonia    last time 2023   Restless leg syndrome    Second degree AV block    Sleep apnea 11/13/2011   Spinal stenosis in cervical region 01/02/2024   Splenomegaly    Past Surgical History:  Procedure Laterality Date   AMPUTATION Right 08/23/2022   Procedure: RIGHT FOOT FIFTH RAY AMPUTATION;  Surgeon: Harden Jerona GAILS, MD;  Location: [facility] OR;  Service: Orthopedics;  Laterality: Right;   AMPUTATION Right 09/04/2022   Procedure: RIGHT BELOW KNEE AMPUTATION;  Surgeon: Harden Jerona GAILS, MD;  Location: [facility] OR;  Service: Orthopedics;  Laterality: Right;   AORTIC VALVE REPLACEMENT N/A 08/22/2015   Procedure: AORTIC VALVE REPLACEMENT (AVR);  Surgeon: Maude Fleeta Ochoa, MD;  Location: [facility] OR;  Service: Open Heart Surgery;  Laterality: N/A;   ATRIAL FIBRILLATION ABLATION  01/30/2012   PVI by Dr. Kelsie   ATRIAL FIBRILLATION ABLATION N/A 01/31/2012   Procedure: ATRIAL FIBRILLATION ABLATION;  Surgeon: Lynwood Kelsie, MD;  Location: [facility] CATH LAB;  Service: Cardiovascular;  Laterality: N/A;   ATRIAL FIBRILLATION ABLATION N/A 04/19/2020   Procedure: ATRIAL FIBRILLATION ABLATION;  Surgeon: Kelsie Lynwood, MD;  Location: [facility] INVASIVE CV LAB;  Service: Cardiovascular;  Laterality: N/A;   BACK SURGERY     X 3   BIOPSY  01/01/2022   Procedure: BIOPSY;  Surgeon: Elicia Claw, MD;  Location: [facility] ENDOSCOPY;  Service: Gastroenterology;;   CARDIAC CATHETERIZATION N/A 08/09/2015   Procedure: Right/Left Heart Cath and Coronary Angiography;  Surgeon: Peter M Jordan, MD;  Location: [facility] INVASIVE CV LAB;  Service: Cardiovascular;  Laterality: N/A;   CARDIAC CATHETERIZATION N/A 02/05/2016   Procedure: Right/Left Heart Cath and Coronary Angiography;  Surgeon: Candyce GORMAN Reek, MD;  Location: [facility] INVASIVE CV LAB;  Service: Cardiovascular;  Laterality: N/A;    CARDIAC CATHETERIZATION N/A 04/17/2016   Procedure: Right Heart Cath;  Surgeon: Toribio JONELLE Fuel, MD;  Location: [facility] INVASIVE CV LAB;  Service: Cardiovascular;  Laterality: N/A;   CARDIOVASCULAR STRESS TEST  03/12/2002   EF 48%, NO EVIDENCE OF ISCHEMIA   CARDIOVERSION  01/2012; 03/31/2012   CARDIOVERSION N/A 02/25/2012   Procedure: CARDIOVERSION;  Surgeon: Lynwood Kelsie, MD;  Location: [facility] CATH LAB;  Service: Cardiovascular;  Laterality: N/A;   CARDIOVERSION N/A 03/31/2012   Procedure: CARDIOVERSION;  Surgeon: Lynwood Kelsie, MD;  Location: [facility] CATH LAB;  Service: Cardiovascular;  Laterality: N/A;   CARDIOVERSION N/A 11/23/2015   Procedure: CARDIOVERSION;  Surgeon: Ezra GORMAN Shuck, MD;  Location: [facility] ENDOSCOPY;  Service: Cardiovascular;  Laterality: N/A;   CARDIOVERSION N/A 04/08/2016   Procedure: CARDIOVERSION;  Surgeon: Toribio JONELLE Fuel, MD;  Location: [facility] ENDOSCOPY;  Service: Cardiovascular;  Laterality: N/A;   CARDIOVERSION N/A 09/14/2018   Procedure: CARDIOVERSION;  Surgeon: Delford Maude BROCKS, MD;  Location: [facility] ENDOSCOPY;  Service: Cardiovascular;  Laterality: N/A;   CARDIOVERSION N/A 10/19/2019   Procedure: CARDIOVERSION;  Surgeon:  Bensimhon, Toribio SAUNDERS, MD;  Location: [facility] ENDOSCOPY;  Service: Cardiovascular;  Laterality: N/A;   CARDIOVERSION N/A 02/25/2020   Procedure: CARDIOVERSION;  Surgeon: Okey Vina GAILS, MD;  Location: [facility] ENDOSCOPY;  Service: Cardiovascular;  Laterality: N/A;   CARDIOVERSION N/A 05/25/2020   Procedure: CARDIOVERSION;  Surgeon: Pietro Redell RAMAN, MD;  Location: [facility] ENDOSCOPY;  Service: Cardiovascular;  Laterality: N/A;   CARDIOVERSION N/A 07/10/2020   Procedure: CARDIOVERSION;  Surgeon: Jeffrie Oneil BROCKS, MD;  Location: [facility] ENDOSCOPY;  Service: Cardiovascular;  Laterality: N/A;   CLIPPING OF ATRIAL APPENDAGE N/A 08/22/2015   Procedure: CLIPPING OF ATRIAL APPENDAGE;  Surgeon: Maude Fleeta Ochoa, MD;  Location: [facility] OR;  Service: Open Heart Surgery;  Laterality: N/A;   COLONOSCOPY WITH  PROPOFOL  N/A 01/01/2022   Procedure: COLONOSCOPY WITH PROPOFOL ;  Surgeon: Elicia Claw, MD;  Location: [facility] ENDOSCOPY;  Service: Gastroenterology;  Laterality: N/A;   DOPPLER ECHOCARDIOGRAPHY  03/11/2002   EF 70-75%   FINGER SURGERY Left    Middle finger   FINGER TENDON REPAIR  1980's   right little finger (03/31/2012)   FLEXIBLE SIGMOIDOSCOPY N/A 12/30/2021   Procedure: FLEXIBLE SIGMOIDOSCOPY;  Surgeon: Dianna Specking, MD;  Location: [facility] ENDOSCOPY;  Service: Gastroenterology;  Laterality: N/A;   INSERT / REPLACE / REMOVE PACEMAKER     St Jude   IR RADIOLOGIST EVAL & MGMT  05/06/2019   IR RADIOLOGIST EVAL & MGMT  10/06/2019   IR RADIOLOGIST EVAL & MGMT  10/31/2020   IR RADIOLOGIST EVAL & MGMT  01/18/2021   IR RADIOLOGIST EVAL & MGMT  03/08/2021   IR RADIOLOGIST EVAL & MGMT  06/13/2021   KIDNEY SURGERY Left    LEFT AND RIGHT HEART CATHETERIZATION WITH CORONARY ANGIOGRAM N/A 10/27/2012   Procedure: LEFT AND RIGHT HEART CATHETERIZATION WITH CORONARY ANGIOGRAM;  Surgeon: Erick SAUNDERS Bergamo, MD;  Location: [facility] CATH LAB;  Service: Cardiovascular;  Laterality: N/A;   LUMBAR DISC SURGERY  1980's X2;  2000's   MAZE N/A 08/22/2015   Procedure: MAZE;  Surgeon: Maude Fleeta Ochoa, MD;  Location: [facility] OR;  Service: Open Heart Surgery;  Laterality: N/A;   PACEMAKER INSERTION  03/31/2012   STJ Accent DR pacemaker implanted by Dr Kelsie   PERMANENT PACEMAKER INSERTION N/A 03/31/2012   Procedure: PERMANENT PACEMAKER INSERTION;  Surgeon: Lynwood Kelsie, MD;  Location: [facility] CATH LAB;  Service: Cardiovascular;  Laterality: N/A;   PPM GENERATOR CHANGEOUT N/A 08/08/2020   Procedure: PPM GENERATOR CHANGEOUT;  Surgeon: Kelsie Lynwood, MD;  Location: [facility] INVASIVE CV LAB;  Service: Cardiovascular;  Laterality: N/A;   RADIOLOGY WITH ANESTHESIA Left 02/14/2021   Procedure: RADIOLOGY WITH ANESTHESIA LEFT RENAL CRYOABLATION;  Surgeon: Luverne Aran, MD;  Location: [facility] ORS;  Service: Radiology;  Laterality: Left;   STUMP  REVISION Right 10/09/2022   Procedure: REVISION RIGHT BELOW KNEE AMPUTATION;  Surgeon: Harden Jerona GAILS, MD;  Location: [facility] OR;  Service: Orthopedics;  Laterality: Right;   STUMP REVISION Right 11/16/2022   Procedure: REVISION RIGHT BELOW KNEE AMPUTATION;  Surgeon: Harden Jerona GAILS, MD;  Location: [facility] OR;  Service: Orthopedics;  Laterality: Right;   TEE WITHOUT CARDIOVERSION  01/30/2012   Procedure: TRANSESOPHAGEAL ECHOCARDIOGRAM (TEE);  Surgeon: Ezra RAMAN Shuck, MD;  Location: [facility] ENDOSCOPY;  Service: Cardiovascular;  Laterality: N/A;  ablation next day   TEE WITHOUT CARDIOVERSION N/A 08/22/2015   Procedure: TRANSESOPHAGEAL ECHOCARDIOGRAM (TEE);  Surgeon: Maude Fleeta Ochoa, MD;  Location: [facility] OR;  Service: Open Heart Surgery;  Laterality: N/A;   TEE WITHOUT CARDIOVERSION N/A 10/19/2019  Procedure: TRANSESOPHAGEAL ECHOCARDIOGRAM (TEE);  Surgeon: Cherrie Toribio SAUNDERS, MD;  Location: [facility] ENDOSCOPY;  Service: Cardiovascular;  Laterality: N/A;   US  ECHOCARDIOGRAPHY  08/07/2009   EF 55-60%   Social History:  reports that he quit smoking about 32 years ago. His smoking use included cigarettes. He started smoking about 62 years ago. He has a 30.2 pack-year smoking history. He has never been exposed to tobacco smoke. He has never used smokeless tobacco. He reports that he does not currently use alcohol . He reports current drug use. Drug: Marijuana.  Allergies[1]  Family History  Problem Relation Age of Onset   Heart failure Mother    Stroke Father    Neuropathy Neg Hx     Prior to Admission medications  Medication Sig Start Date End Date Taking? Authorizing Provider  acetaminophen  (TYLENOL ) 325 MG tablet Take 2 tablets (650 mg total) by mouth every 6 (six) hours as needed. Patient taking differently: Take 650 mg by mouth every 6 (six) hours as needed for mild pain (pain score 1-3) (or headaches). 12/24/22  Yes Nicholas Bar, MD  albuterol  (VENTOLIN  HFA) 108 (90 Base) MCG/ACT inhaler Inhale 2 puffs into the  lungs every 6 (six) hours as needed for wheezing or shortness of breath. 05/07/23  Yes Mannam, Praveen, MD  allopurinol (ZYLOPRIM) 300 MG tablet Take 300 mg by mouth daily.   Yes [provider]  apixaban  (ELIQUIS ) 5 MG TABS tablet Take 1 tablet (5 mg total) by mouth 2 (two) times daily. 10/28/23  Yes Bensimhon, Toribio SAUNDERS, MD  bisoprolol  (ZEBETA ) 5 MG tablet Take 1 tablet (5 mg total) by mouth daily. 01/06/24  Yes Bensimhon, Toribio SAUNDERS, MD  Budeson-Glycopyrrol-Formoterol  (BREZTRI  AEROSPHERE) 160-9-4.8 MCG/ACT AERO Inhale 2 puffs into the lungs in the morning and at bedtime. 05/07/23  Yes Mannam, Praveen, MD  Cholecalciferol  (VITAMIN D3) 50 MCG (2000 UT) TABS Take 4,000 Units by mouth daily.   Yes [provider]  colchicine 0.6 MG tablet Take 0.6 mg by mouth daily as needed (for gout flares- as directed). 10/14/23  Yes [provider]  cyclobenzaprine  (FLEXERIL ) 5 MG tablet Take 2.5-5 mg by mouth 2 (two) times daily as needed for muscle spasms.   Yes [provider]  empagliflozin  (JARDIANCE ) 10 MG TABS tablet Take 1 tablet (10 mg total) by mouth daily. Patient taking differently: Take 10 mg by mouth at bedtime. 02/02/24  Yes Bensimhon, Toribio SAUNDERS, MD  gabapentin  (NEURONTIN ) 300 MG capsule Take 2 capsules (600 mg total) by mouth 3 (three) times daily. for neuropathy Patient taking differently: Take 600 mg by mouth in the morning and at bedtime. 10/19/22  Yes Rai, Ripudeep K, MD  ipratropium (ATROVENT ) 0.03 % nasal spray USE 2 SPRAYS IN BOTH NOSTRILS  TWICE DAILY Patient taking differently: Place 2 sprays into both nostrils 2 (two) times daily as needed for rhinitis. 04/07/24  Yes Hope Almarie ORN, NP  ipratropium-albuterol  (DUONEB) 0.5-2.5 (3) MG/3ML SOLN Take 3 mLs by nebulization every 6 (six) hours as needed. Patient taking differently: Take 3 mLs by nebulization every 6 (six) hours as needed (for shortness of breath or wheezing). 05/07/23 05/06/24 Yes Mannam, Praveen, MD   levothyroxine  (SYNTHROID , LEVOTHROID) 175 MCG tablet Take 175 mcg by mouth daily before breakfast.   Yes [provider]  Menthol -Camphor (TIGER BALM ARTHRITIS RUB EX) Apply 1 application  topically as needed (for pain).   Yes [provider]  montelukast  (SINGULAIR ) 10 MG tablet TAKE 1 TABLET(10 MG) BY MOUTH AT BEDTIME Patient taking  differently: Take 10 mg by mouth every evening. 10/16/23  Yes Shelah Lamar RAMAN, MD  Multiple Minerals-Vitamins (CAL-MAG-ZINC -D) TABS Take 1 tablet by mouth at bedtime.   Yes [provider]  Multiple Vitamin (MULTIVITAMIN WITH MINERALS) TABS tablet Take 1 tablet by mouth daily. Patient taking differently: Take 1 tablet by mouth daily with breakfast. 12/15/19  Yes Sheikh, Omair Latif, DO  ondansetron  (ZOFRAN ) 4 MG tablet Take 1 tablet (4 mg total) by mouth every 6 (six) hours. Patient taking differently: Take 4 mg by mouth every 6 (six) hours as needed for nausea or vomiting. 01/31/23  Yes Ellouise, Victoria K, DO  oxyCODONE -acetaminophen  (PERCOCET) 10-325 MG tablet Take 1-2 tablets by mouth See admin instructions. Take 1-2 tablets by mouth in the morning & at bedtime- and an additional 1 tablet once a day as needed for unresolved pain 12/30/23  Yes [provider]  OXYGEN  Inhale 2 L/min into the lungs See admin instructions. Inhale 2 L/min into the lungs at bedtime and during the day as needed for shortness of breath   Yes [provider]  pantoprazole  (PROTONIX ) 40 MG tablet Take 40 mg by mouth daily before breakfast. Patient taking differently: Take 40 mg by mouth in the morning and at bedtime.   Yes [provider]  Polyethyl Glyc-Propyl Glyc PF (SYSTANE ULTRA PF) 0.4-0.3 % SOLN Place 1-2 drops into both eyes 3 (three) times daily as needed (for dryness).   Yes [provider]  polyethylene glycol powder (GLYCOLAX /MIRALAX ) 17 GM/SCOOP powder Take 17 g by mouth 2 (two) times daily. Dissolve 1 capful (17g) in 4-8  ounces of liquid and take by mouth twice daily. Patient taking differently: Take 17 g by mouth every other day as needed for mild constipation (Dissolve 1 capful (17g) in 4-8 ounces of liquid). 01/05/24  Yes Jillian Buttery, MD  potassium chloride  SA (KLOR-CON  M) 20 MEQ tablet Take 1 tablet (20 mEq total) by mouth 2 (two) times daily. 12/15/23  Yes Milford, Harlene HERO, FNP  predniSONE  (DELTASONE ) 2.5 MG tablet Take 1 tablet (2.5 mg total) by mouth daily with breakfast. 05/07/23  Yes Mannam, Praveen, MD  roflumilast  (DALIRESP ) 500 MCG TABS tablet Take 1 tablet (500 mcg total) by mouth daily. 09/26/23  Yes Hope Almarie ORN, NP  rOPINIRole  (REQUIP ) 1 MG tablet Take 1 tablet (1 mg total) by mouth in the morning and at bedtime. Patient taking differently: Take 1 mg by mouth every morning. 01/23/20  Yes Cheryle Page, MD  rosuvastatin  (CRESTOR ) 10 MG tablet Take 10 mg by mouth at bedtime.  08/08/16  Yes [provider]  spironolactone  (ALDACTONE ) 25 MG tablet Take 1 tablet (25 mg total) by mouth daily. Patient taking differently: Take 12.5 mg by mouth daily. 02/16/16  Yes Lesia Ozell Barter, PA-C  torsemide  (DEMADEX ) 20 MG tablet Take 40 mg alternating with 20 mg daily. Patient taking differently: Take 20-40 mg by mouth See admin instructions. Take 20 mg by mouth in the morning, ALTERNATING WITH 40 mg EVERY OTHER DAY 12/24/23  Yes Milford, Harlene HERO, FNP  traZODone  (DESYREL ) 50 MG tablet Take 50 mg by mouth at bedtime.   Yes [provider]    Physical Exam: Vitals:   04/09/24 0100 04/09/24 0300 04/09/24 0400 04/09/24 0624  BP: 130/77 118/77 126/70 131/73  Pulse: 65 61 60 64  Resp: 20 (!) 22 (!) 22 16  Temp: 98.1 F (36.7 C)  98.2 F (36.8 C) 98.5 F (36.9 C)  TempSrc: Oral  Oral  SpO2: 95% 96% 95% 96%   Physical Exam Vitals reviewed.  Constitutional:      General: He is awake. He is not in acute distress.    Appearance: He is obese. He is ill-appearing.  HENT:     Head:  Normocephalic.     Nose: No rhinorrhea.     Mouth/Throat:     Mouth: Mucous membranes are moist.  Eyes:     General: No scleral icterus.    Pupils: Pupils are equal, round, and reactive to light.  Neck:     Vascular: No JVD.  Cardiovascular:     Rate and Rhythm: Normal rate and regular rhythm.     Heart sounds: S1 normal and S2 normal.  Pulmonary:     Breath sounds: Rales present. No wheezing or rhonchi.  Abdominal:     General: Bowel sounds are normal.     Palpations: Abdomen is soft.     Tenderness: There is no abdominal tenderness. There is no right CVA tenderness or left CVA tenderness.  Musculoskeletal:     Cervical back: Neck supple.     Right lower leg: No edema.     Left lower leg: No edema.  Skin:    General: Skin is warm and dry.  Neurological:     General: No focal deficit present.     Mental Status: He is alert and oriented to person, place, and time.  Psychiatric:        Mood and Affect: Mood normal.        Behavior: Behavior normal. Behavior is cooperative.     Data Reviewed:  Results are pending, will review when available.  06/12/2023 echocardiogram report. IMPRESSIONS:   1. Left ventricular ejection fraction, by estimation, is 60 to 65%. The  left ventricle has normal function. The left ventricle has no regional  wall motion abnormalities. Left ventricular diastolic parameters are  consistent with Grade III diastolic  dysfunction (restrictive). There is the interventricular septum is  flattened in systole, consistent with right ventricular pressure overload.   2. PA acceleration time consistent with pulmonary hypertension.. Right  ventricular systolic function is moderately reduced. The right ventricular  size is mildly enlarged. 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 The estimated right   ventricular systolic pressure is 78.0 mmHg.   3. Eccentric, posteriorly directed MR jet, at least moderate. Moderate  amount of splay,  TEE may be indicated to better quantify. Systolic  blunting of the pulmonary veins. The mitral valve is normal in structure.  Moderate mitral valve regurgitation. No  evidence of mitral stenosis.   4. Tricuspid valve regurgitation is moderate.   5. The aortic valve has been repaired/replaced. Aortic valve  regurgitation is not visualized. No aortic stenosis is present. There is a  bioprosthetic valve present in the aortic position. Procedure Date: 2017.   6. Aortic dilatation noted. There is moderate dilatation of the ascending  aorta, measuring 43 mm.   7. The inferior vena cava is dilated in size with <50% respiratory  variability, suggesting right atrial pressure of 15 mmHg.   04/09/2024 echocardiogram report. IMPRESSIONS:   1. Left ventricular ejection fraction, by estimation, is 45 to 50%. The  left ventricle has mildly decreased function. The left ventricle  demonstrates global hypokinesis with septal-lateral dyssynchrony  consistent with RV pacing. Left ventricular  diastolic parameters are indeterminate.   2. D-shaped interventricular septum suggestive of RV pressure/volume  overload. Right ventricular systolic function is moderately reduced. The  right ventricular  size is moderately enlarged. There is moderately  elevated pulmonary artery systolic pressure.   The estimated right ventricular systolic pressure is 58.0 mmHg.   3. Left atrial size was mildly dilated.   4. Right atrial size was moderately dilated.   5. The mitral valve is normal in structure. Mild mitral valve  regurgitation. No evidence of mitral stenosis.   6. Bioprosthetic aortic valve. Trivial perivalvular leakage. Visually the  valve opens well, mean gradient 7 mmHg. EOA 2.25 cm^2.   7. The inferior vena cava is dilated in size with <50% respiratory  variability, suggesting right atrial pressure of 15 mmHg.   8. Technically difficult study with poor acoustic windows.   EKG: Vent. rate 66 BPM  PR  interval * ms  QRS duration 220 ms  QT/QTcB 513/538 ms  P-R-T axes * -79 103  Ventricular paced.  Artifact.  Assessment and Plan: Principal Problem:   Acute on chronic diastolic congestive heart failure (HCC) Observation/telemetry. Supplemental oxygen  as needed. Sodium and fluid restriction. Continue furosemide  40 mg IVP daily. Monitor daily weights, intake and output. Monitor renal function electrolytes. Check transthoracic echocardiogram.  Active Problems:   Hypothyroidism Continue levothyroxine  175 mcg p.o. daily.    DM2 (diabetes mellitus, type 2) (HCC) Carbohydrate modified diet. Continue Jardiance  10 mg p.o. daily. CBG monitoring with RI SS. Check hemoglobin A1c.    Chronic kidney disease, stage 3b (HCC) Monitor renal function and electrolytes.    PAF (paroxysmal atrial fibrillation) (HCC) CHA?DS?-VASc Score of at least 4. Continue apixaban  5 mg p.o. twice daily    Essential hypertension Continue furosemide  as above. Continue spironolactone  12.5 mg p.o. daily. Continue bisoprolol  5 mg p.o. at bedtime.    Sleep apnea CPAP at bedtime.    GERD without esophagitis Continue pantoprazole  40 mg p.o. daily.    CAD (coronary artery disease) Continue beta-blocker, statin and DOAC.    COPD mixed type (HCC) Continue Breztri  nhaler 2 puffs twice daily. Continue Roflumilast  500 mcg p.o. daily. Continue montelukast  10 mg p.o. every evening. Continue DuoNebs every 6 hours as needed.    Gouty arthritis Continue daily allopurinol. Continue colchicine as needed.    Macrocytic anemia Monitor hematocrit hemoglobin. B12 and folic acid have been normal in the past.    Advance Care Planning:   Code Status: Limited: Do not attempt resuscitation (DNR) -DNR-LIMITED -Do Not Intubate/DNI    Consults:   Family Communication:   Severity of Illness: The appropriate patient status for this patient is OBSERVATION. Observation status is judged to be reasonable and necessary  in order to provide the required intensity of service to ensure the patient's safety. The patient's presenting symptoms, physical exam findings, and initial radiographic and laboratory data in the context of their medical condition is felt to place them at decreased risk for further clinical deterioration. Furthermore, it is anticipated that the patient will be medically stable for discharge from the hospital within 2 midnights of admission.   Author: Alm Dorn Castor, MD 04/09/2024 8:23 AM  For on call review [email or web address].   This document was prepared using Dragon voice recognition software and may contain some unintended transcription errors.     [1]  Allergies Allergen Reactions   Cephalexin  Rash and Other (See Comments)    Broke out all over   Meloxicam Rash and Other (See Comments)    Red Man Syndrome, also   Vancomycin  Other (See Comments)    Red Man's syndrome 09/02/15, resolved with diphenhydramine  and slowing of rate

## 2024-04-09 NOTE — Evaluation (Signed)
 Physical Therapy Evaluation Patient Details Name: [name] MRN: [id number] DOB: 11/30/1950 Today's Date: 04/09/2024  History of Present Illness  [name] is a 73 y.o. male presents to the ED with complaint of chest pain, dyspnea, scrotal pain, left leg pain, weakness. PMH: atrial fibrillation, CHF, DM 2, COPD, GERD, hyperlipidemia, hypertension, right sided BKA, fibromyalgia, pacemaker  Clinical Impression  Pt admitted with above diagnosis. Pt unable to provide PTA history, does mention his wife, a dog and using a w/c but no details or conversation. Pt agreeable to PT eval upon arrival, able to state name, DOB, month/year and location but needing max multimodal cues to maintain alertness, difficulty following commands. Pt noted to be saturated in urine upon linen removal, needing max A+2 to roll R/L for linen change and total assist for pericare and lower body dressing removal. Per chart review, pt was transferring to w/c independently, no family present during eval. Patient will benefit from continued inpatient follow up therapy, <3 hours/day.  Pt currently with functional limitations due to the deficits listed below (see PT Problem List). Pt will benefit from acute skilled PT to increase their independence and safety with mobility to allow discharge.           If plan is discharge home, recommend the following: Two people to help with walking and/or transfers;A lot of help with bathing/dressing/bathroom;Assistance with cooking/housework;Assist for transportation   Can travel by private vehicle   No    Equipment Recommendations None recommended by PT (defer to next venue)  Recommendations for Other Services       Functional Status Assessment Patient has had a recent decline in their functional status and demonstrates the ability to make significant improvements in function in a reasonable and predictable amount of time.     Precautions / Restrictions  Precautions Precautions: Fall Recall of Precautions/Restrictions: Impaired Precaution/Restrictions Comments: R BKA Restrictions Weight Bearing Restrictions Per Provider Order: No      Mobility  Bed Mobility Overal bed mobility: Needs Assistance Bed Mobility: Rolling Rolling: Max assist, +2 for physical assistance         General bed mobility comments: max A+2 to roll R/L in bed for linen change and lower body clothing removal due to saturated in urine, pt minimally assisting, following commands ~25% of the time with increased time and maximum multimodal cues    Transfers                   General transfer comment: not attempted    Ambulation/Gait                  Stairs            Wheelchair Mobility     Tilt Bed    Modified Rankin (Stroke Patients Only)       Balance                                             Pertinent Vitals/Pain Pain Assessment Pain Assessment: Faces Faces Pain Scale: Hurts whole lot Pain Location: generalized with movement; also reports back and L leg Pain Descriptors / Indicators: Grimacing, Guarding, Moaning Pain Intervention(s): Limited activity within patient's tolerance, Premedicated before session, Repositioned    Home Living                     Additional Comments:  Per chart review, pt from home with spouse, was transferring into w/c, pt unable to provide history and no family present on eval, pt does reference having multiple dogs at home    Prior Function Prior Level of Function : Patient poor historian/Family not available             Mobility Comments: Per chart review, pt from home with spouse, was transferring into w/c, pt unable to provide history and no family present on eval ADLs Comments: Per chart review, pt from home with spouse, was transferring into w/c, pt unable to provide history and no family present on eval     Extremity/Trunk Assessment   Upper  Extremity Assessment Upper Extremity Assessment: Defer to OT evaluation    Lower Extremity Assessment Lower Extremity Assessment: RLE deficits/detail;LLE deficits/detail;Difficult to assess due to impaired cognition RLE Deficits / Details: BKA noted, hip/knee AAROM WFL, needing max multimodal cues for ROM due to cognition but limited LLE Deficits / Details: minimal movement throughout LE, facial wincing noted with PROM to lift leg to remove linen under,max multimodal cues for ROM due to cognition but limited LLE: Unable to fully assess due to pain       Communication   Communication Communication: Impaired Factors Affecting Communication:  (mumbled speech able to improve with max cues)    Cognition Arousal: Lethargic, Obtunded Behavior During Therapy: Flat affect   PT - Cognitive impairments: No family/caregiver present to determine baseline                       PT - Cognition Comments: pt having difficulty maintaining arousal, therapist using lound voice and sternal rub to maintain engagement, slow to respond to simple questions or not responding at all, does make intermittent jokes while appearing to be asleep take me to the car wash - alerted nurse regarding cognition Following commands: Impaired Following commands impaired: Follows one step commands inconsistently, Follows one step commands with increased time     Cueing Cueing Techniques: Verbal cues, Gestural cues, Tactile cues, Visual cues     General Comments General comments (skin integrity, edema, etc.): Pt with redness noted throughout perineal and scrotal region with urine saturated underwear on; also noted to have skin tear to anterior L leg - alerted nurse    Exercises     Assessment/Plan    PT Assessment Patient needs continued PT services  PT Problem List Decreased strength;Decreased range of motion;Decreased activity tolerance;Decreased balance;Decreased mobility;Decreased cognition;Decreased  knowledge of use of DME;Decreased safety awareness;Decreased knowledge of precautions;Pain;Decreased skin integrity       PT Treatment Interventions DME instruction;Gait training;Functional mobility training;Therapeutic activities;Therapeutic exercise;Balance training;Patient/family education;Stair training;Cognitive remediation;Neuromuscular re-education;Wheelchair mobility training    PT Goals (Current goals can be found in the Care Plan section)  Acute Rehab PT Goals Patient Stated Goal: none stated PT Goal Formulation: With patient Time For Goal Achievement: 04/23/24 Potential to Achieve Goals: Fair    Frequency Min 2X/week     Co-evaluation               AM-PAC PT 6 Clicks Mobility  Outcome Measure Help needed turning from your back to your side while in a flat bed without using bedrails?: Total Help needed moving from lying on your back to sitting on the side of a flat bed without using bedrails?: Total Help needed moving to and from a bed to a chair (including a wheelchair)?: Total Help needed standing up from a chair using your arms (  e.g., wheelchair or bedside chair)?: Total Help needed to walk in hospital room?: Total Help needed climbing 3-5 steps with a railing? : Total 6 Click Score: 6    End of Session Equipment Utilized During Treatment: Oxygen  Activity Tolerance: Patient limited by lethargy;Patient limited by pain Patient left: in bed;with call bell/phone within reach Nurse Communication: Mobility status;Weight bearing status (saturated in urine, L leg skin tear, cognition) PT Visit Diagnosis: Other abnormalities of gait and mobility (R26.89);Muscle weakness (generalized) (M62.81);Difficulty in walking, not elsewhere classified (R26.2)    Time: 9087-9063 PT Time Calculation (min) (ACUTE ONLY): 24 min   Charges:   PT Evaluation $PT Eval Moderate Complexity: 1 Mod   PT General Charges $$ ACUTE PT VISIT: 1 Visit         Tori Adra Shepler PT,  DPT 04/09/2024, 9:59 AM

## 2024-04-09 NOTE — ED Notes (Signed)
 Pt is alert and oriented. Pt was able to take medications and drink water without assistance. Pt stated that he was not hungry and refused breakfast.

## 2024-04-09 NOTE — Progress Notes (Signed)
°  Echocardiogram 2D Echocardiogram has been performed.  [name], RDCS 04/09/2024, 12:49 PM

## 2024-04-09 NOTE — ED Notes (Signed)
 Brought pt's lunch tray. Placed at bedside.

## 2024-04-09 NOTE — Plan of Care (Signed)

## 2024-04-10 LAB — RENAL FUNCTION PANEL
Albumin: 4.2 g/dL (ref 3.5–5.0)
Anion gap: 11 (ref 5–15)
BUN: 31 mg/dL — ABNORMAL HIGH (ref 8–23)
CO2: 31 mmol/L (ref 22–32)
Calcium: 9.7 mg/dL (ref 8.9–10.3)
Chloride: 100 mmol/L (ref 98–111)
Creatinine, Ser: 1.61 mg/dL — ABNORMAL HIGH (ref 0.61–1.24)
GFR, Estimated: 45 mL/min — ABNORMAL LOW (ref >60)
Glucose, Bld: 136 mg/dL — ABNORMAL HIGH (ref 70–99)
Phosphorus: 2.9 mg/dL (ref 2.5–4.6)
Potassium: 3.9 mmol/L (ref 3.5–5.1)
Sodium: 142 mmol/L (ref 135–145)

## 2024-04-10 LAB — CBC
HCT: 37.5 % — ABNORMAL LOW (ref 39.0–52.0)
Hemoglobin: 11.8 g/dL — ABNORMAL LOW (ref 13.0–17.0)
MCH: 32.2 pg (ref 26.0–34.0)
MCHC: 31.5 g/dL (ref 30.0–36.0)
MCV: 102.5 fL — ABNORMAL HIGH (ref 80.0–100.0)
Platelets: 129 K/uL — ABNORMAL LOW (ref 150–400)
RBC: 3.66 MIL/uL — ABNORMAL LOW (ref 4.22–5.81)
RDW: 17.9 % — ABNORMAL HIGH (ref 11.5–15.5)
WBC: 8.1 K/uL (ref 4.0–10.5)
nRBC: 0 % (ref 0.0–0.2)

## 2024-04-10 LAB — GLUCOSE, CAPILLARY
Glucose-Capillary: 184 mg/dL — ABNORMAL HIGH (ref 70–99)
Glucose-Capillary: 136 mg/dL — ABNORMAL HIGH (ref 70–99)
Glucose-Capillary: 151 mg/dL — ABNORMAL HIGH (ref 70–99)

## 2024-04-10 MED ORDER — FUROSEMIDE 10 MG/ML IJ SOLN
40.0000 mg | Freq: Once | INTRAMUSCULAR | Status: AC
  Administered 2024-04-10: 40 mg via INTRAVENOUS
  Filled 2024-04-10: qty 4

## 2024-04-10 MED ORDER — FUROSEMIDE 10 MG/ML IJ SOLN
60.0000 mg | Freq: Two times a day (BID) | INTRAMUSCULAR | Status: DC
  Administered 2024-04-10 – 2024-04-11 (×2): 60 mg via INTRAVENOUS
  Filled 2024-04-10 (×2): qty 6

## 2024-04-10 MED ORDER — GABAPENTIN 300 MG PO CAPS
300.0000 mg | ORAL_CAPSULE | Freq: Two times a day (BID) | ORAL | Status: DC
  Administered 2024-04-11 – 2024-04-14 (×7): 300 mg via ORAL
  Filled 2024-04-10 (×7): qty 1

## 2024-04-10 MED ORDER — MORPHINE SULFATE (PF) 2 MG/ML IV SOLN
1.0000 mg | INTRAVENOUS | Status: DC | PRN
  Administered 2024-04-12: 23:00:00 1 mg via INTRAVENOUS
  Filled 2024-04-10: qty 1

## 2024-04-10 NOTE — Progress Notes (Signed)
 [facility] PROGRESS NOTE   [name] FMW:[id number] DOB: Aug 06, 1950 DOA: 04/08/2024  PCP: Larnell Hamilton, MD  Brief History: 73 y.o. male with medical history significant of mild aortic stenosis, osteoarthritis of the hand, asthma, atrial fibrillation, asthma/COPD, DDD, fatty liver, fibromyalgia, GERD, hyperlipidemia, hypothyroidism, history of intractable abdominal pain cannabis dependence, previous morbid obesity, restless leg syndrome, cervical spinal stenosis, hypertension, first-degree AV block, second-degree AV block, history of pacemaker placement, chronic diastolic heart failure who was seen for chest pain in the emergency department, seen by [facility] team to arrange discharge, but the hospitalist service was called due to the patient having progressively worse dyspnea for the last few weeks associated with orthopnea, intermittent episodes of precordial chest pain, sharp in nature and nonradiated.  Patient was hospitalized for further management of acute CHF.    Consultants: None yet  Procedures: Echocardiogram is pending    Subjective/Interval History: Patient very poor historian.  Denies any chest pain.  Complains of abdominal pain which appears to be chronic based on H&P.    Assessment/Plan:  Acute on chronic systolic and diastolic CHF Echocardiogram shows slightly diminished LVEF of 45 to 50%.  EF is appears to have decreased compared to echocardiogram from February. Patient noted to be on IV furosemide  once a day.  Noted to have elevated creatinine from his CKD.  Will increase the dose of his furosemide . Monitor ins and outs and daily weights. Patient noted to be on bisoprolol , Jardiance , spironolactone , statin.  Will hold the bisoprolol  for now due to acute CHF. His weight has been recorded at 220 pounds.  Previous weight recordings were noted.  He weighed about between 185-200 pounds earlier this year.  So he appears to have gained some weight.  Continue to  monitor daily for now. Since he is otherwise stable from a cardiac standpoint no need to involve cardiology currently.  May be done in the outpatient setting if he continues to improve. Mildly elevated troponin levels likely due to CHF.  Chronic abdominal pain Based on H&P it looks like patient has chronic abdominal pain.  Has abdominal tenderness.  CT of the abdomen pelvis did not show any acute findings.  Bladder scan did not show any significant urinary retention.  Continue to monitor.  Chronic kidney disease stage IIIb Baseline creatinine seems to be around 1.8.  Stable renal function noted.  Monitor closely while he is getting diuresed.  Avoid nephrotoxic agents.  Diabetes mellitus type 2 Monitor CBGs.  SSI.  Paroxysmal atrial fibrillation/essential hypertension/coronary artery disease Continue apixaban .  Monitor on telemetry. Monitor blood pressures  Hypothyroidism Continue levothyroxine .  Mild thrombocytopenia Stable.  History of COPD Continue with his inhaler regimen.  Gouty arthritis Noted to be on allopurinol .  Macrocytic anemia No evidence of overt bleeding. Check TSH free T4.  Will check anemia panel.    DVT Prophylaxis: On apixaban  Code Status: DNR Family Communication: No family at bedside Disposition Plan: SNF  Status is: Observation The patient will require care spanning > 2 midnights and should be moved to inpatient because: Acute CHF requiring IV diuretics      Medications: Scheduled:  allopurinol   300 mg Oral Daily   apixaban   5 mg Oral BID   bisoprolol   5 mg Oral Daily   budesonide -glycopyrrolate -formoterol   2 puff Inhalation BID   empagliflozin   10 mg Oral QHS   furosemide   40 mg Intravenous Daily   gabapentin   600 mg Oral BID   levothyroxine   175 mcg Oral Q0600  montelukast   10 mg Oral QPM   pantoprazole   40 mg Oral BID   potassium chloride  SA  20 mEq Oral BID   predniSONE   2.5 mg Oral Q breakfast   roflumilast   500 mcg Oral Daily    rOPINIRole   1 mg Oral q morning   rosuvastatin   10 mg Oral QHS   spironolactone   12.5 mg Oral Daily   traZODone   50 mg Oral QHS   Continuous: PRN:acetaminophen  **OR** acetaminophen , alum & mag hydroxide-simeth, colchicine , cyclobenzaprine , ipratropium-albuterol , morphine  injection, ondansetron , oxyCODONE -acetaminophen  **AND** oxyCODONE , polyethylene glycol  Antibiotics: Anti-infectives (From admission, onward)    None       Objective:  Vital Signs  Vitals:   04/09/24 2101 04/09/24 2146 04/10/24 0055 04/10/24 0554  BP: 127/75  139/77 125/67  Pulse: (!) 59  63 69  Resp: 19  (!) 21 17  Temp: 98.4 F (36.9 C)  98.5 F (36.9 C) 98.5 F (36.9 C)  TempSrc: Oral  Oral Oral  SpO2: 97% 96% 100% 96%  Weight:      Height:        Intake/Output Summary (Last 24 hours) at 04/10/2024 1000 Last data filed at 04/09/2024 1800 Gross per 24 hour  Intake 200 ml  Output --  Net 200 ml   Filed Weights   04/09/24 1831  Weight: 99.8 kg    General appearance: Somnolent but easily arousable in no distress.  Not very communicative Resp: Clear to auscultation bilaterally.  Normal effort Cardio: S1-S2 is normal regular.  No S3-S4.  No rubs murmurs or bruit GI: Abdomen is soft.  Diffusely tender without any rebound rigidity or guarding.  No masses organomegaly. Extremities: No edema bilateral lower extremities Neurologic:   No focal neurological deficits.    Lab Results:  Data Reviewed: I have personally reviewed following labs and reports of the imaging studies  CBC: Recent Labs  Lab 04/08/24 1245 04/09/24 0811 04/10/24 0529  WBC 6.2 6.7 8.1  NEUTROABS 4.8 5.5  --   HGB 11.5* 9.9* 11.8*  HCT 36.5* 32.3* 37.5*  MCV 102.2* 105.9* 102.5*  PLT 132* 106* 129*    Basic Metabolic Panel: Recent Labs  Lab 04/08/24 1245 04/09/24 0811 04/10/24 0529  NA 140 143 142  K 3.6 3.8 3.9  CL 97* 104 100  CO2 32 32 31  GLUCOSE 201* 131* 136*  BUN 40* 30* 31*  CREATININE 2.08* 1.63*  1.61*  CALCIUM  10.1 8.8* 9.7  MG  --  2.2  --   PHOS  --   --  2.9    GFR: Estimated Creatinine Clearance: 48.5 mL/min (A) (by C-G formula based on SCr of 1.61 mg/dL (H)).  Liver Function Tests: Recent Labs  Lab 04/08/24 1245 04/09/24 0811 04/10/24 0529  AST 34 26  --   ALT 28 23  --   ALKPHOS 128* 120  --   BILITOT 0.6 0.8  --   PROT 7.8 6.9  --   ALBUMIN  4.5 3.8 4.2    Recent Labs  Lab 04/08/24 1245  LIPASE 30   BNP (last 3 results) Recent Labs    04/08/24 1245  PROBNP 1,840.0*    Recent Results (from the past 240 hours)  Resp panel by RT-PCR (RSV, Flu A&B, Covid) Anterior Nasal Swab     Status: None   Collection Time: 04/08/24  1:00 PM   Specimen: Anterior Nasal Swab  Result Value Ref Range Status   SARS Coronavirus 2 by RT PCR NEGATIVE NEGATIVE Final  Comment: (NOTE) SARS-CoV-2 target nucleic acids are NOT DETECTED.  The SARS-CoV-2 RNA is generally detectable in upper respiratory specimens during the acute phase of infection. The lowest concentration of SARS-CoV-2 viral copies this assay can detect is 138 copies/mL. A negative result does not preclude SARS-Cov-2 infection and should not be used as the sole basis for treatment or other patient management decisions. A negative result may occur with  improper specimen collection/handling, submission of specimen other than nasopharyngeal swab, presence of viral mutation(s) within the areas targeted by this assay, and inadequate number of viral copies(<138 copies/mL). A negative result must be combined with clinical observations, patient history, and epidemiological information. The expected result is Negative.  Fact Sheet for Patients:  bloggercourse.com  Fact Sheet for Healthcare Providers:  seriousbroker.it  This test is no t yet approved or cleared by the [place]  FDA and  has been authorized for detection and/or diagnosis of SARS-CoV-2 by FDA  under an Emergency Use Authorization (EUA). This EUA will remain  in effect (meaning this test can be used) for the duration of the COVID-19 declaration under Section 564(b)(1) of the Act, 21 U.S.C.section 360bbb-3(b)(1), unless the authorization is terminated  or revoked sooner.       Influenza A by PCR NEGATIVE NEGATIVE Final   Influenza B by PCR NEGATIVE NEGATIVE Final    Comment: (NOTE) The Xpert Xpress SARS-CoV-2/FLU/RSV plus assay is intended as an aid in the diagnosis of influenza from Nasopharyngeal swab specimens and should not be used as a sole basis for treatment. Nasal washings and aspirates are unacceptable for Xpert Xpress SARS-CoV-2/FLU/RSV testing.  Fact Sheet for Patients: bloggercourse.com  Fact Sheet for Healthcare Providers: seriousbroker.it  This test is not yet approved or cleared by the [place]  FDA and has been authorized for detection and/or diagnosis of SARS-CoV-2 by FDA under an Emergency Use Authorization (EUA). This EUA will remain in effect (meaning this test can be used) for the duration of the COVID-19 declaration under Section 564(b)(1) of the Act, 21 U.S.C. section 360bbb-3(b)(1), unless the authorization is terminated or revoked.     Resp Syncytial Virus by PCR NEGATIVE NEGATIVE Final    Comment: (NOTE) Fact Sheet for Patients: bloggercourse.com  Fact Sheet for Healthcare Providers: seriousbroker.it  This test is not yet approved or cleared by the [place]  FDA and has been authorized for detection and/or diagnosis of SARS-CoV-2 by FDA under an Emergency Use Authorization (EUA). This EUA will remain in effect (meaning this test can be used) for the duration of the COVID-19 declaration under Section 564(b)(1) of the Act, 21 U.S.C. section 360bbb-3(b)(1), unless the authorization is terminated or revoked.  Performed at [facility], [address] [place], [place] [address]       Radiology Studies: ECHOCARDIOGRAM COMPLETE Result Date: 04/09/2024    ECHOCARDIOGRAM REPORT   Patient Name:   [name] Date of Exam: 04/09/2024 Medical Rec #:  [id number]       Height:       71.0 in Accession #:    [id number]      Weight:       200.0 lb Date of Birth:  1950/07/11       BSA:          2.109 m Patient Age:    73 years        BP:           137/69 mmHg Patient Gender: M  HR:           64 bpm. Exam Location:  Inpatient Procedure: 2D Echo, Color Doppler and Cardiac Doppler (Both Spectral and Color            Flow Doppler were utilized during procedure). Indications:    CHF- Acute Diastolic I50.31  History:        Patient has prior history of Echocardiogram examinations, most                 recent 06/12/2023. CHF, CAD, Pacemaker, Pulmonary HTN and COPD,                 Aortic Valve Disease and s/p AVR, there is a bioprosthetic valve                 present in the AO position. Procedure date: 2017; Risk                 Factors:Dyslipidemia, Hypertension and Diabetes.  Sonographer:    Koleen Popper RDCS Referring Phys: [id number] DAVID MANUEL ORTIZ IMPRESSIONS  1. Left ventricular ejection fraction, by estimation, is 45 to 50%. The left ventricle has mildly decreased function. The left ventricle demonstrates global hypokinesis with septal-lateral dyssynchrony consistent with RV pacing. Left ventricular diastolic parameters are indeterminate.  2. D-shaped interventricular septum suggestive of RV pressure/volume overload. Right ventricular systolic function is moderately reduced. The right ventricular size is moderately enlarged. There is moderately elevated pulmonary artery systolic pressure.  The estimated right ventricular systolic pressure is 58.0 mmHg.  3. Left atrial size was mildly dilated.  4. Right atrial size was moderately dilated.  5. The mitral valve is normal in structure. Mild mitral valve  regurgitation. No evidence of mitral stenosis.  6. Bioprosthetic aortic valve. Trivial perivalvular leakage. Visually the valve opens well, mean gradient 7 mmHg. EOA 2.25 cm^2.  7. The inferior vena cava is dilated in size with <50% respiratory variability, suggesting right atrial pressure of 15 mmHg.  8. Technically difficult study with poor acoustic windows. FINDINGS  Left Ventricle: Left ventricular ejection fraction, by estimation, is 45 to 50%. The left ventricle has mildly decreased function. The left ventricle demonstrates global hypokinesis. The left ventricular internal cavity size was normal in size. There is  no left ventricular hypertrophy. Left ventricular diastolic parameters are indeterminate. Right Ventricle: D-shaped interventricular septum suggestive of RV pressure/volume overload. The right ventricular size is moderately enlarged. No increase in right ventricular wall thickness. Right ventricular systolic function is moderately reduced. There is moderately elevated pulmonary artery systolic pressure. The tricuspid regurgitant velocity is 3.28 m/s, and with an assumed right atrial pressure of 15 mmHg, the estimated right ventricular systolic pressure is 58.0 mmHg. Left Atrium: Left atrial size was mildly dilated. Right Atrium: Right atrial size was moderately dilated. Pericardium: There is no evidence of pericardial effusion. Mitral Valve: The mitral valve is normal in structure. Mild mitral annular calcification. Mild mitral valve regurgitation. No evidence of mitral valve stenosis. Tricuspid Valve: The tricuspid valve is normal in structure. Tricuspid valve regurgitation is trivial. Aortic Valve: Bioprosthetic aortic valve. Trivial perivalvular leakage. Visually the valve opens well, mean gradient 7 mmHg. EOA 2.25 cm^2. Aortic valve mean gradient measures 7.0 mmHg. Aortic valve peak gradient measures 13.5 mmHg. Aortic valve area, by  VTI measures 2.25 cm. Pulmonic Valve: The pulmonic valve was  not well visualized. Pulmonic valve regurgitation is not visualized. Aorta: The aortic root is normal in size and structure. Venous: The inferior vena cava is dilated  in size with less than 50% respiratory variability, suggesting right atrial pressure of 15 mmHg. IAS/Shunts: No atrial level shunt detected by color flow Doppler. Additional Comments: A device lead is visualized in the right ventricle.  LEFT VENTRICLE PLAX 2D LVIDd:         5.00 cm LVIDs:         3.40 cm LV PW:         1.10 cm LV IVS:        0.90 cm LVOT diam:     1.90 cm LV SV:         80 LV SV Index:   38 LVOT Area:     2.84 cm  LV Volumes (MOD) LV vol d, MOD A2C: 168.0 ml LV vol d, MOD A4C: 114.0 ml LV vol s, MOD A2C: 76.0 ml LV vol s, MOD A4C: 55.7 ml LV SV MOD A2C:     92.0 ml LV SV MOD A4C:     114.0 ml LV SV MOD BP:      80.1 ml RIGHT VENTRICLE RV Basal diam:  5.00 cm RV Mid diam:    4.10 cm RV S prime:     5.03 cm/s TAPSE (M-mode): 0.8 cm LEFT ATRIUM             Index        RIGHT ATRIUM           Index LA diam:        4.70 cm 2.23 cm/m   RA Area:     24.90 cm LA Vol (A2C):   59.0 ml 27.98 ml/m  RA Volume:   85.40 ml  40.50 ml/m LA Vol (A4C):   59.2 ml 28.08 ml/m LA Biplane Vol: 61.6 ml 29.21 ml/m  AORTIC VALVE AV Area (Vmax):    2.43 cm AV Area (Vmean):   2.35 cm AV Area (VTI):     2.25 cm AV Vmax:           184.00 cm/s AV Vmean:          119.000 cm/s AV VTI:            0.356 m AV Peak Grad:      13.5 mmHg AV Mean Grad:      7.0 mmHg LVOT Vmax:         158.00 cm/s LVOT Vmean:        98.800 cm/s LVOT VTI:          0.283 m LVOT/AV VTI ratio: 0.79  AORTA Ao Root diam: 2.90 cm MITRAL VALVE                TRICUSPID VALVE MV Area (PHT): 4.80 cm     TR Peak grad:   43.0 mmHg MV Decel Time: 158 msec     TR Vmax:        328.00 cm/s MR Peak grad: 53.0 mmHg MR Vmax:      364.00 cm/s   SHUNTS MV E velocity: 107.00 cm/s  Systemic VTI:  0.28 m MV A velocity: 32.50 cm/s   Systemic Diam: 1.90 cm MV E/A ratio:  3.29 Dalton McleanMD Electronically  signed by Ezra Kanner Signature Date/Time: 04/09/2024/4:05:33 PM    Final    VAS US  LOWER EXTREMITY VENOUS (DVT) (7a-7p) Result Date: 04/08/2024  Lower Venous DVT Study Patient Name:  [name]  Date of Exam:   04/08/2024 Medical Rec #: [id number]        Accession #:  [id number] Date of Birth: 1951-02-25        Patient Gender: M Patient Age:   69 years Exam Location:  [facility] Procedure:      VAS US  LOWER EXTREMITY VENOUS (DVT) Referring Phys: JAYSON PEREYRA --------------------------------------------------------------------------------  Indications: Pain.  Risk Factors: None identified. Limitations: Poor ultrasound/tissue interface. Comparison Study: No prior studies. Performing Technologist: Cordella Collet RVT  Examination Guidelines: A complete evaluation includes B-mode imaging, spectral Doppler, color Doppler, and power Doppler as needed of all accessible portions of each vessel. Bilateral testing is considered an integral part of a complete examination. Limited examinations for reoccurring indications may be performed as noted. The reflux portion of the exam is performed with the patient in reverse Trendelenburg.  +-----+---------------+---------+-----------+----------+--------------+ RIGHTCompressibilityPhasicitySpontaneityPropertiesThrombus Aging +-----+---------------+---------+-----------+----------+--------------+ CFV  Full           Yes      No                                  +-----+---------------+---------+-----------+----------+--------------+   +---------+---------------+---------+-----------+----------+--------------+ LEFT     CompressibilityPhasicitySpontaneityPropertiesThrombus Aging +---------+---------------+---------+-----------+----------+--------------+ CFV      Full           Yes      No                                  +---------+---------------+---------+-----------+----------+--------------+ SFJ      Full                                                         +---------+---------------+---------+-----------+----------+--------------+ FV Prox  Full                                                        +---------+---------------+---------+-----------+----------+--------------+ FV Mid   Full                                                        +---------+---------------+---------+-----------+----------+--------------+ FV DistalFull                                                        +---------+---------------+---------+-----------+----------+--------------+ PFV      Full                                                        +---------+---------------+---------+-----------+----------+--------------+ POP      Full           Yes      No                                  +---------+---------------+---------+-----------+----------+--------------+  PTV      Full                                                        +---------+---------------+---------+-----------+----------+--------------+ PERO     Full                                                        +---------+---------------+---------+-----------+----------+--------------+    Summary: RIGHT: - No evidence of common femoral vein obstruction.   LEFT: - There is no evidence of deep vein thrombosis in the lower extremity.  - No cystic structure found in the popliteal fossa.  *See table(s) above for measurements and observations. Electronically signed by Debby Robertson on 04/08/2024 at 10:25:17 PM.    Final    CT Angio Chest PE W and/or Wo Contrast Result Date: 04/08/2024 CLINICAL DATA:  Concern for pulmonary embolism. Abdominal pain. History of renal cell carcinoma status post prior ablation of the left kidney. EXAM: CT ANGIOGRAPHY CHEST CT ABDOMEN AND PELVIS WITH CONTRAST TECHNIQUE: Multidetector CT imaging of the chest was performed using the standard protocol during bolus administration of intravenous contrast. Multiplanar CT image  reconstructions and MIPs were obtained to evaluate the vascular anatomy. Multidetector CT imaging of the abdomen and pelvis was performed using the standard protocol during bolus administration of intravenous contrast. RADIATION DOSE REDUCTION: This exam was performed according to the departmental dose-optimization program which includes automated exposure control, adjustment of the mA and/or kV according to patient size and/or use of iterative reconstruction technique. CONTRAST:  75mL OMNIPAQUE  IOHEXOL  350 MG/ML SOLN COMPARISON:  Chest radiograph dated 04/08/2024. CT abdomen pelvis dated 01/02/2024. FINDINGS: CTA CHEST FINDINGS Cardiovascular: There is no cardiomegaly or pericardial effusion. Retrograde flow of contrast from the right atrium into the IVC 2 suggestive of right heart dysfunction. There is coronary vascular calcification. Mechanical aortic valve. There is mild atherosclerotic calcification of the thoracic aorta. No pulmonary artery embolus identified. Mediastinum/Nodes: No hilar or mediastinal adenopathy. The esophagus is grossly unremarkable. No mediastinal fluid collection. Lungs/Pleura: No focal consolidation, pleural effusion, pneumothorax. Small scattered calcified granuloma. The central airways are patent. Musculoskeletal: Median sternotomy wires. Left pectoral pacemaker device. No acute osseous pathology. Osteopenia with degenerative changes of the spine. Review of the MIP images confirms the above findings. CT ABDOMEN and PELVIS FINDINGS No intra-abdominal free air or free fluid. Hepatobiliary: Slight irregularity of the liver contour suspicious for early changes of cirrhosis. No biliary dilatation. Small gallstone. No pericholecystic fluid or evidence of acute cholecystitis by CT. Pancreas: Unremarkable. No pancreatic ductal dilatation or surrounding inflammatory changes. Spleen: Normal in size without focal abnormality. Adrenals/Urinary Tract: The adrenal glands unremarkable. Mild left and  severe right renal atrophy. Similar appearance of a 1.5 cm post ablation treatment of the left kidney. There is no hydronephrosis on either side. The visualized ureters and urinary bladder appear unremarkable. Stomach/Bowel: There is no bowel obstruction or active inflammation. The appendix is normal. Vascular/Lymphatic: Advanced aortoiliac atherosclerotic disease. The IVC is unremarkable. No portal venous gas. There is no adenopathy. Reproductive: The prostate and seminal vesicles are grossly normal. Other: Small fat containing bilateral inguinal hernias. Small fat containing umbilical hernia. Musculoskeletal: Osteopenia.  No acute osseous pathology. Review of the MIP images confirms the above findings. IMPRESSION: 1. No acute intrathoracic, abdominal, or pelvic pathology. No CT evidence of pulmonary artery embolus. 2. Cholelithiasis. 3. Similar appearance of a 1.5 cm post ablation treatment of the left kidney. 4.  Aortic Atherosclerosis (ICD10-I70.0). Electronically Signed   By: Vanetta Chou M.D.   On: 04/08/2024 16:22   CT ABDOMEN PELVIS W CONTRAST Result Date: 04/08/2024 CLINICAL DATA:  Concern for pulmonary embolism. Abdominal pain. History of renal cell carcinoma status post prior ablation of the left kidney. EXAM: CT ANGIOGRAPHY CHEST CT ABDOMEN AND PELVIS WITH CONTRAST TECHNIQUE: Multidetector CT imaging of the chest was performed using the standard protocol during bolus administration of intravenous contrast. Multiplanar CT image reconstructions and MIPs were obtained to evaluate the vascular anatomy. Multidetector CT imaging of the abdomen and pelvis was performed using the standard protocol during bolus administration of intravenous contrast. RADIATION DOSE REDUCTION: This exam was performed according to the departmental dose-optimization program which includes automated exposure control, adjustment of the mA and/or kV according to patient size and/or use of iterative reconstruction technique.  CONTRAST:  75mL OMNIPAQUE  IOHEXOL  350 MG/ML SOLN COMPARISON:  Chest radiograph dated 04/08/2024. CT abdomen pelvis dated 01/02/2024. FINDINGS: CTA CHEST FINDINGS Cardiovascular: There is no cardiomegaly or pericardial effusion. Retrograde flow of contrast from the right atrium into the IVC 2 suggestive of right heart dysfunction. There is coronary vascular calcification. Mechanical aortic valve. There is mild atherosclerotic calcification of the thoracic aorta. No pulmonary artery embolus identified. Mediastinum/Nodes: No hilar or mediastinal adenopathy. The esophagus is grossly unremarkable. No mediastinal fluid collection. Lungs/Pleura: No focal consolidation, pleural effusion, pneumothorax. Small scattered calcified granuloma. The central airways are patent. Musculoskeletal: Median sternotomy wires. Left pectoral pacemaker device. No acute osseous pathology. Osteopenia with degenerative changes of the spine. Review of the MIP images confirms the above findings. CT ABDOMEN and PELVIS FINDINGS No intra-abdominal free air or free fluid. Hepatobiliary: Slight irregularity of the liver contour suspicious for early changes of cirrhosis. No biliary dilatation. Small gallstone. No pericholecystic fluid or evidence of acute cholecystitis by CT. Pancreas: Unremarkable. No pancreatic ductal dilatation or surrounding inflammatory changes. Spleen: Normal in size without focal abnormality. Adrenals/Urinary Tract: The adrenal glands unremarkable. Mild left and severe right renal atrophy. Similar appearance of a 1.5 cm post ablation treatment of the left kidney. There is no hydronephrosis on either side. The visualized ureters and urinary bladder appear unremarkable. Stomach/Bowel: There is no bowel obstruction or active inflammation. The appendix is normal. Vascular/Lymphatic: Advanced aortoiliac atherosclerotic disease. The IVC is unremarkable. No portal venous gas. There is no adenopathy. Reproductive: The prostate and  seminal vesicles are grossly normal. Other: Small fat containing bilateral inguinal hernias. Small fat containing umbilical hernia. Musculoskeletal: Osteopenia.  No acute osseous pathology. Review of the MIP images confirms the above findings. IMPRESSION: 1. No acute intrathoracic, abdominal, or pelvic pathology. No CT evidence of pulmonary artery embolus. 2. Cholelithiasis. 3. Similar appearance of a 1.5 cm post ablation treatment of the left kidney. 4.  Aortic Atherosclerosis (ICD10-I70.0). Electronically Signed   By: Vanetta Chou M.D.   On: 04/08/2024 16:22   US  SCROTUM W/DOPPLER Result Date: 04/08/2024 EXAM: ULTRASOUND SCROTUM/TESTICLES WITH DOPPLER FLOW EVALUATION 04/08/2024 03:29:00 PM TECHNIQUE: Duplex ultrasound using B-mode/gray scaled imaging, Doppler spectral analysis and color flow Doppler was obtained of the testicles. COMPARISON: 09/17/2017 CLINICAL HISTORY: left testicle pain/swelling FINDINGS: RIGHT: GREY SCALE: The right testicle measures 3.8 x 3.0 x 2.7 cm. It demonstrates normal  homogeneous echotexture without focal lesion. No testicular microlithiasis. DOPPLER EVALUATION: There is normal arterial and venous Doppler flow within the testicle. VARICOCELE: No scrotal varicocele. SCROTAL SAC: Small hydrocele. EPIDIDYMIS: No acute abnormality. LEFT: GREY SCALE: The left testicle measures 4.0 x 3.1 x 1.9 cm. It demonstrates normal homogeneous echotexture without focal lesion. No testicular microlithiasis. DOPPLER EVALUATION: There is normal arterial and venous Doppler flow within the testicle. VARICOCELE: No scrotal varicocele. SCROTAL SAC: Small hydrocele. EPIDIDYMIS: Left epididymal cyst. No acute abnormality. IMPRESSION: 1. Small bilateral hydroceles. 2. Left epididymal head cyst measuring up to 1.9 cm. Electronically signed by: Lynwood Seip MD 04/08/2024 03:58 PM EST RP Workstation: [id number]   DG Chest 2 View Result Date: 04/08/2024 CLINICAL DATA:  Chest pain. EXAM: CHEST - 2 VIEW  COMPARISON:  Chest radiograph dated 03/16/2024. FINDINGS: No focal consolidation, pleural effusion or pneumothorax. Enlargement of the bilateral hila, suggestive of pulmonary hypertension. Clinical correlation recommended. Stable mild cardiomegaly. 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 Left pectoral pacemaker device. No acute osseous pathology. IMPRESSION: 1. No acute cardiopulmonary process. 2. Mild cardiomegaly. Electronically Signed   By: Vanetta Chou M.D.   On: 04/08/2024 14:27       LOS: 0 days   Evelene Roussin Verdene  [facility] Pager on [email or web address]  04/10/2024, 10:00 AM

## 2024-04-10 NOTE — Progress Notes (Signed)
 PT Cancellation Note  Patient Details Name: [name] MRN: [id number] DOB: 1950-10-01   Cancelled Treatment:     PT attempted but deferred at pt request - pt adamant he is sleeping and does not want to be disturbed.  Will follow.   Aniza Shor 04/10/2024, 1:24 PM

## 2024-04-10 NOTE — Care Management Obs Status (Signed)
 MEDICARE OBSERVATION STATUS NOTIFICATION   Patient Details  Name: [name] MRN: [id number] Date of Birth: 14-Oct-1950   Medicare Observation Status Notification Given:  Yes    [name] A Pacer Dorn, LCSW 04/10/2024, 10:38 AM

## 2024-04-10 NOTE — Progress Notes (Signed)
 OT Cancellation Note  Patient Details Name: BRYKER FLETCHALL MRN: [id number] DOB: 12-Jun-1950   Cancelled Treatment:    Reason Eval/Treat Not Completed: Patient declined. He stated he was sleeping and wanted to continue to do so. He asked for therapy to come back tomorrow.  Delanna JINNY Lesches, OTR/L 04/10/2024, 1:00 PM

## 2024-04-10 NOTE — Plan of Care (Signed)
 Patient vitals remain stable.  Patient very fatigued and poor apatite. Wife at bedside much of afternoon.

## 2024-04-11 DIAGNOSIS — Z794 Long term (current) use of insulin: Secondary | ICD-10-CM | POA: Diagnosis not present

## 2024-04-11 DIAGNOSIS — K760 Fatty (change of) liver, not elsewhere classified: Secondary | ICD-10-CM | POA: Diagnosis present

## 2024-04-11 DIAGNOSIS — G2581 Restless legs syndrome: Secondary | ICD-10-CM | POA: Diagnosis present

## 2024-04-11 DIAGNOSIS — I2510 Atherosclerotic heart disease of native coronary artery without angina pectoris: Secondary | ICD-10-CM | POA: Diagnosis present

## 2024-04-11 DIAGNOSIS — Z89511 Acquired absence of right leg below knee: Secondary | ICD-10-CM | POA: Diagnosis not present

## 2024-04-11 DIAGNOSIS — J4489 Other specified chronic obstructive pulmonary disease: Secondary | ICD-10-CM | POA: Diagnosis present

## 2024-04-11 DIAGNOSIS — M109 Gout, unspecified: Secondary | ICD-10-CM | POA: Diagnosis present

## 2024-04-11 DIAGNOSIS — M797 Fibromyalgia: Secondary | ICD-10-CM | POA: Diagnosis present

## 2024-04-11 DIAGNOSIS — N1832 Chronic kidney disease, stage 3b: Secondary | ICD-10-CM | POA: Diagnosis present

## 2024-04-11 DIAGNOSIS — E785 Hyperlipidemia, unspecified: Secondary | ICD-10-CM | POA: Diagnosis present

## 2024-04-11 DIAGNOSIS — D509 Iron deficiency anemia, unspecified: Secondary | ICD-10-CM | POA: Diagnosis present

## 2024-04-11 DIAGNOSIS — I480 Paroxysmal atrial fibrillation: Secondary | ICD-10-CM | POA: Diagnosis present

## 2024-04-11 DIAGNOSIS — I130 Hypertensive heart and chronic kidney disease with heart failure and stage 1 through stage 4 chronic kidney disease, or unspecified chronic kidney disease: Secondary | ICD-10-CM | POA: Diagnosis present

## 2024-04-11 DIAGNOSIS — E1122 Type 2 diabetes mellitus with diabetic chronic kidney disease: Secondary | ICD-10-CM | POA: Diagnosis present

## 2024-04-11 DIAGNOSIS — I5033 Acute on chronic diastolic (congestive) heart failure: Secondary | ICD-10-CM | POA: Diagnosis present

## 2024-04-11 DIAGNOSIS — D696 Thrombocytopenia, unspecified: Secondary | ICD-10-CM | POA: Diagnosis present

## 2024-04-11 DIAGNOSIS — E1142 Type 2 diabetes mellitus with diabetic polyneuropathy: Secondary | ICD-10-CM | POA: Diagnosis present

## 2024-04-11 DIAGNOSIS — Z952 Presence of prosthetic heart valve: Secondary | ICD-10-CM | POA: Diagnosis not present

## 2024-04-11 DIAGNOSIS — K219 Gastro-esophageal reflux disease without esophagitis: Secondary | ICD-10-CM | POA: Diagnosis present

## 2024-04-11 DIAGNOSIS — Z1152 Encounter for screening for COVID-19: Secondary | ICD-10-CM | POA: Diagnosis not present

## 2024-04-11 DIAGNOSIS — E039 Hypothyroidism, unspecified: Secondary | ICD-10-CM | POA: Diagnosis present

## 2024-04-11 DIAGNOSIS — D539 Nutritional anemia, unspecified: Secondary | ICD-10-CM | POA: Diagnosis present

## 2024-04-11 DIAGNOSIS — Z7901 Long term (current) use of anticoagulants: Secondary | ICD-10-CM | POA: Diagnosis not present

## 2024-04-11 DIAGNOSIS — Z66 Do not resuscitate: Secondary | ICD-10-CM | POA: Diagnosis present

## 2024-04-11 LAB — GLUCOSE, CAPILLARY
Glucose-Capillary: 166 mg/dL — ABNORMAL HIGH (ref 70–99)
Glucose-Capillary: 164 mg/dL — ABNORMAL HIGH (ref 70–99)
Glucose-Capillary: 162 mg/dL — ABNORMAL HIGH (ref 70–99)
Glucose-Capillary: 155 mg/dL — ABNORMAL HIGH (ref 70–99)

## 2024-04-11 LAB — CBC
HCT: 36.3 % — ABNORMAL LOW (ref 39.0–52.0)
Hemoglobin: 11.6 g/dL — ABNORMAL LOW (ref 13.0–17.0)
MCH: 32.9 pg (ref 26.0–34.0)
MCHC: 32 g/dL (ref 30.0–36.0)
MCV: 102.8 fL — ABNORMAL HIGH (ref 80.0–100.0)
Platelets: 141 K/uL — ABNORMAL LOW (ref 150–400)
RBC: 3.53 MIL/uL — ABNORMAL LOW (ref 4.22–5.81)
RDW: 17.9 % — ABNORMAL HIGH (ref 11.5–15.5)
WBC: 7.3 K/uL (ref 4.0–10.5)
nRBC: 0 % (ref 0.0–0.2)

## 2024-04-11 LAB — IRON AND TIBC
Iron: 21 ug/dL — ABNORMAL LOW (ref 45–182)
Saturation Ratios: 7 % — ABNORMAL LOW (ref 17.9–39.5)
TIBC: 298 ug/dL (ref 250–450)
UIBC: 277 ug/dL

## 2024-04-11 LAB — BASIC METABOLIC PANEL WITH GFR
Anion gap: 12 (ref 5–15)
BUN: 37 mg/dL — ABNORMAL HIGH (ref 8–23)
CO2: 31 mmol/L (ref 22–32)
Calcium: 9.5 mg/dL (ref 8.9–10.3)
Chloride: 99 mmol/L (ref 98–111)
Creatinine, Ser: 1.9 mg/dL — ABNORMAL HIGH (ref 0.61–1.24)
GFR, Estimated: 37 mL/min — ABNORMAL LOW (ref >60)
Glucose, Bld: 161 mg/dL — ABNORMAL HIGH (ref 70–99)
Potassium: 4.1 mmol/L (ref 3.5–5.1)
Sodium: 141 mmol/L (ref 135–145)

## 2024-04-11 LAB — HEMOGLOBIN A1C
Hgb A1c MFr Bld: 6.2 % — ABNORMAL HIGH (ref 4.8–5.6)
Mean Plasma Glucose: 131.24 mg/dL

## 2024-04-11 LAB — RETICULOCYTES
Immature Retic Fract: 21.8 % — ABNORMAL HIGH (ref 2.3–15.9)
RBC.: 3.55 MIL/uL — ABNORMAL LOW (ref 4.22–5.81)
Retic Count, Absolute: 97.3 K/uL (ref 19.0–186.0)
Retic Ct Pct: 2.7 % (ref 0.4–3.1)

## 2024-04-11 LAB — VITAMIN B12: Vitamin B-12: 975 pg/mL — ABNORMAL HIGH (ref 180–914)

## 2024-04-11 LAB — FERRITIN: Ferritin: 1059 ng/mL — ABNORMAL HIGH (ref 24–336)

## 2024-04-11 LAB — FOLATE: Folate: >20 ng/mL (ref >5.9)

## 2024-04-11 MED ORDER — FUROSEMIDE 10 MG/ML IJ SOLN
40.0000 mg | Freq: Two times a day (BID) | INTRAMUSCULAR | Status: DC
  Administered 2024-04-11 – 2024-04-12 (×2): 40 mg via INTRAVENOUS
  Filled 2024-04-11 (×2): qty 4

## 2024-04-11 MED ORDER — IPRATROPIUM-ALBUTEROL 0.5-2.5 (3) MG/3ML IN SOLN
3.0000 mL | Freq: Three times a day (TID) | RESPIRATORY_TRACT | Status: DC
  Administered 2024-04-11 (×2): 3 mL via RESPIRATORY_TRACT
  Filled 2024-04-11 (×2): qty 3

## 2024-04-11 NOTE — Progress Notes (Signed)
 PROGRESS NOTE    [name]  FMW:[id number] DOB: 02/22/1951 DOA: 04/08/2024 PCP: Larnell Hamilton, MD   Brief Narrative: 73 year old with past medical history significant for mild aortic stenosis, osteoarthritis of the hand, asthma, A-fib, asthma, COPD, DDD, fatty liver, fibromyalgia, GERD, hyperlipidemia, hypothyroidism, history of intractable abdominal pain, cannabis dependence, previous morbid obesity, restless leg syndrome, cervical spinal stenosis, hypertension, first-degree AV block, second-degree AV block, history of pacemaker, chronic diastolic heart failure who was seen for chest pain in the ED subsequently hospitalist consulted for worsening dyspnea over the last few weeks with orthopnea, intermittent episode of precordial sharp chest pain in nature nonradiating.  Admitted for heart failure exacerbation.     Assessment & Plan:   Principal Problem:   Acute on chronic diastolic congestive heart failure (HCC) Active Problems:   Hypothyroidism   DM2 (diabetes mellitus, type 2) (HCC)   Chronic kidney disease, stage 3b (HCC)   PAF (paroxysmal atrial fibrillation) (HCC)   Essential hypertension   Sleep apnea   GERD without esophagitis   CAD (coronary artery disease)   COPD mixed type (HCC)   Gouty arthritis   Macrocytic anemia  1-Acute on chronic systolic diastolic heart failure exacerbation -Patient developed worsening shortness of breath, orthopnea precordial chest pain Echo showed left ventricular ejection fraction 45 to 50%, ejection fraction appears to have decreased compared to echo from February - Currently holding bisoprolol  due to acute heart failure exacerbation - Previous weight 220 pounds.  Monitor daily weight Continue IV Lasix  40 mg IV twice daily, reduced from 60 twice daily due to mildly increased creatinine today Monitor renal function closely -2.3 L  Chronic abdominal pain Patient has a history of chronic abdominal pain.  CT abdomen and pelvis not show  any acute finding No significant bladder or urinary retention  Chronic kidney disease stage IIIb Baseline around 1.8 Monitor on diuretic Present with a creatinine of 2.0  Diabetes type 2: Continue sliding scale insulin   Paroxysmal A-fib/essential hypertension/CAD Continue apixaban   Hypothyroidism - Continue levothyroxine   Mild thrombocytopenia - Monitor  History of COPD On chronic 2 L of oxygen  Continue with inhaler.  Will add scheduled DuoNeb  Gout arthritis Continue allopurinol   Microcytic anemia Iron low, TSAT 7.  Folate more than 20, B12 975.  He will need IV iron     Estimated body mass index is 26.17 kg/m as calculated from the following:   Height as of this encounter: 5' 11 (1.803 m).   Weight as of this encounter: 85.1 kg.   DVT prophylaxis: Eliquis  Code Status: DNR Family Communication: No family at bedside Disposition Plan:  Status is: Observation The patient remains OBS appropriate and will d/c before 2 midnights.    Consultants:  None  Procedures:  none  Antimicrobials:    Subjective: He is alert, reports breathing better.  No other complaint He is still on 3 L of oxygen   Objective: Vitals:   04/10/24 2130 04/11/24 0427 04/11/24 0437 04/11/24 0829  BP: 118/73 125/73    Pulse: 66 63    Resp: 19 16    Temp: 98.6 F (37 C) 97.7 F (36.5 C)    TempSrc: Oral Oral    SpO2: 95% 94%  95%  Weight:   85.1 kg   Height:        Intake/Output Summary (Last 24 hours) at 04/11/2024 0859 Last data filed at 04/11/2024 0438 Gross per 24 hour  Intake 480 ml  Output 2650 ml  Net -2170 ml   [employer]  04/09/24 1831 04/11/24 0437  Weight: 99.8 kg 85.1 kg    Examination:  General exam: Appears calm and comfortable  Respiratory system: BL crackles. SABRA Respiratory effort normal. Cardiovascular system: S1 & S2 heard, RRR. No JVD, murmurs, rubs, gallops or clicks. No pedal edema. Gastrointestinal system: Abdomen is nondistended, soft  and nontender. No organomegaly or masses felt. Normal bowel sounds heard. Central nervous system: Alert and oriented.  Extremities: Symmetric 5 x 5 power.   Data Reviewed: I have personally reviewed following labs and imaging studies  CBC: Recent Labs  Lab 04/08/24 1245 04/09/24 0811 04/10/24 0529 04/11/24 0546  WBC 6.2 6.7 8.1 7.3  NEUTROABS 4.8 5.5  --   --   HGB 11.5* 9.9* 11.8* 11.6*  HCT 36.5* 32.3* 37.5* 36.3*  MCV 102.2* 105.9* 102.5* 102.8*  PLT 132* 106* 129* 141*   Basic Metabolic Panel: Recent Labs  Lab 04/08/24 1245 04/09/24 0811 04/10/24 0529 04/11/24 0546  NA 140 143 142 141  K 3.6 3.8 3.9 4.1  CL 97* 104 100 99  CO2 32 32 31 31  GLUCOSE 201* 131* 136* 161*  BUN 40* 30* 31* 37*  CREATININE 2.08* 1.63* 1.61* 1.90*  CALCIUM  10.1 8.8* 9.7 9.5  MG  --  2.2  --   --   PHOS  --   --  2.9  --    GFR: Estimated Creatinine Clearance: 36.3 mL/min (A) (by C-G formula based on SCr of 1.9 mg/dL (H)). Liver Function Tests: Recent Labs  Lab 04/08/24 1245 04/09/24 0811 04/10/24 0529  AST 34 26  --   ALT 28 23  --   ALKPHOS 128* 120  --   BILITOT 0.6 0.8  --   PROT 7.8 6.9  --   ALBUMIN  4.5 3.8 4.2   Recent Labs  Lab 04/08/24 1245  LIPASE 30   No results for input(s): AMMONIA in the last 168 hours. Coagulation Profile: No results for input(s): INR, PROTIME in the last 168 hours. Cardiac Enzymes: No results for input(s): CKTOTAL, CKMB, CKMBINDEX, TROPONINI in the last 168 hours. BNP (last 3 results) Recent Labs    04/08/24 1245  PROBNP 1,840.0*   HbA1C: No results for input(s): HGBA1C in the last 72 hours. CBG: Recent Labs  Lab 04/10/24 1245 04/10/24 1646 04/10/24 2127 04/11/24 0751  GLUCAP 184* 136* 151* 166*   Lipid Profile: No results for input(s): CHOL, HDL, LDLCALC, TRIG, CHOLHDL, LDLDIRECT in the last 72 hours. Thyroid  Function Tests: No results for input(s): TSH, T4TOTAL, FREET4, T3FREE,  THYROIDAB in the last 72 hours. Anemia Panel: Recent Labs    04/11/24 0546 04/11/24 0600  VITAMINB12  --  975*  FOLATE  --  >20.0  FERRITIN  --  1,059*  TIBC  --  298  IRON  --  21*  RETICCTPCT 2.7  --    Sepsis Labs: No results for input(s): PROCALCITON, LATICACIDVEN in the last 168 hours.  Recent Results (from the past 240 hours)  Resp panel by RT-PCR (RSV, Flu A&B, Covid) Anterior Nasal Swab     Status: None   Collection Time: 04/08/24  1:00 PM   Specimen: Anterior Nasal Swab  Result Value Ref Range Status   SARS Coronavirus 2 by RT PCR NEGATIVE NEGATIVE Final    Comment: (NOTE) SARS-CoV-2 target nucleic acids are NOT DETECTED.  The SARS-CoV-2 RNA is generally detectable in upper respiratory specimens during the acute phase of infection. The lowest concentration of SARS-CoV-2 viral copies this assay can detect is  138 copies/mL. A negative result does not preclude SARS-Cov-2 infection and should not be used as the sole basis for treatment or other patient management decisions. A negative result may occur with  improper specimen collection/handling, submission of specimen other than nasopharyngeal swab, presence of viral mutation(s) within the areas targeted by this assay, and inadequate number of viral copies(<138 copies/mL). A negative result must be combined with clinical observations, patient history, and epidemiological information. The expected result is Negative.  Fact Sheet for Patients:  bloggercourse.com  Fact Sheet for Healthcare Providers:  seriousbroker.it  This test is no t yet approved or cleared by the [place]  FDA and  has been authorized for detection and/or diagnosis of SARS-CoV-2 by FDA under an Emergency Use Authorization (EUA). This EUA will remain  in effect (meaning this test can be used) for the duration of the COVID-19 declaration under Section 564(b)(1) of the Act, 21 U.S.C.section  360bbb-3(b)(1), unless the authorization is terminated  or revoked sooner.       Influenza A by PCR NEGATIVE NEGATIVE Final   Influenza B by PCR NEGATIVE NEGATIVE Final    Comment: (NOTE) The Xpert Xpress SARS-CoV-2/FLU/RSV plus assay is intended as an aid in the diagnosis of influenza from Nasopharyngeal swab specimens and should not be used as a sole basis for treatment. Nasal washings and aspirates are unacceptable for Xpert Xpress SARS-CoV-2/FLU/RSV testing.  Fact Sheet for Patients: bloggercourse.com  Fact Sheet for Healthcare Providers: seriousbroker.it  This test is not yet approved or cleared by the [place]  FDA and has been authorized for detection and/or diagnosis of SARS-CoV-2 by FDA under an Emergency Use Authorization (EUA). This EUA will remain in effect (meaning this test can be used) for the duration of the COVID-19 declaration under Section 564(b)(1) of the Act, 21 U.S.C. section 360bbb-3(b)(1), unless the authorization is terminated or revoked.     Resp Syncytial Virus by PCR NEGATIVE NEGATIVE Final    Comment: (NOTE) Fact Sheet for Patients: bloggercourse.com  Fact Sheet for Healthcare Providers: seriousbroker.it  This test is not yet approved or cleared by the [place]  FDA and has been authorized for detection and/or diagnosis of SARS-CoV-2 by FDA under an Emergency Use Authorization (EUA). This EUA will remain in effect (meaning this test can be used) for the duration of the COVID-19 declaration under Section 564(b)(1) of the Act, 21 U.S.C. section 360bbb-3(b)(1), unless the authorization is terminated or revoked.  Performed at [facility], [address], [place], [place] [address]          Radiology Studies: ECHOCARDIOGRAM COMPLETE Result Date: 04/09/2024    ECHOCARDIOGRAM REPORT   Patient Name:   [name] Date of Exam: 04/09/2024 Medical Rec #:  [id number]       Height:       71.0 in Accession #:    [id number]      Weight:       200.0 lb Date of Birth:  [birth year]       BSA:          2.109 m Patient Age:    73 years        BP:           137/69 mmHg Patient Gender: M               HR:           64 bpm. Exam Location:  Inpatient Procedure: 2D Echo, Color Doppler and Cardiac Doppler (Both Spectral and Color  Flow Doppler were utilized during procedure). Indications:    CHF- Acute Diastolic I50.31  History:        Patient has prior history of Echocardiogram examinations, most                 recent 06/12/2023. CHF, CAD, Pacemaker, Pulmonary HTN and COPD,                 Aortic Valve Disease and s/p AVR, there is a bioprosthetic valve                 present in the AO position. Procedure date: 2017; Risk                 Factors:Dyslipidemia, Hypertension and Diabetes.  Sonographer:    Koleen Popper RDCS Referring Phys: [id number] DAVID MANUEL ORTIZ IMPRESSIONS  1. Left ventricular ejection fraction, by estimation, is 45 to 50%. The left ventricle has mildly decreased function. The left ventricle demonstrates global hypokinesis with septal-lateral dyssynchrony consistent with RV pacing. Left ventricular diastolic parameters are indeterminate.  2. D-shaped interventricular septum suggestive of RV pressure/volume overload. Right ventricular systolic function is moderately reduced. The right ventricular size is moderately enlarged. There is moderately elevated pulmonary artery systolic pressure.  The estimated right ventricular systolic pressure is 58.0 mmHg.  3. Left atrial size was mildly dilated.  4. Right atrial size was moderately dilated.  5. The mitral valve is normal in structure. Mild mitral valve regurgitation. No evidence of mitral stenosis.  6. Bioprosthetic aortic valve. Trivial perivalvular leakage. Visually the valve opens well, mean gradient 7 mmHg. EOA 2.25 cm^2.  7. The inferior vena cava is  dilated in size with <50% respiratory variability, suggesting right atrial pressure of 15 mmHg.  8. Technically difficult study with poor acoustic windows. FINDINGS  Left Ventricle: Left ventricular ejection fraction, by estimation, is 45 to 50%. The left ventricle has mildly decreased function. The left ventricle demonstrates global hypokinesis. The left ventricular internal cavity size was normal in size. There is  no left ventricular hypertrophy. Left ventricular diastolic parameters are indeterminate. Right Ventricle: D-shaped interventricular septum suggestive of RV pressure/volume overload. The right ventricular size is moderately enlarged. No increase in right ventricular wall thickness. Right ventricular systolic function is moderately reduced. There is moderately elevated pulmonary artery systolic pressure. The tricuspid regurgitant velocity is 3.28 m/s, and with an assumed right atrial pressure of 15 mmHg, the estimated right ventricular systolic pressure is 58.0 mmHg. Left Atrium: Left atrial size was mildly dilated. Right Atrium: Right atrial size was moderately dilated. Pericardium: There is no evidence of pericardial effusion. Mitral Valve: The mitral valve is normal in structure. Mild mitral annular calcification. Mild mitral valve regurgitation. No evidence of mitral valve stenosis. Tricuspid Valve: The tricuspid valve is normal in structure. Tricuspid valve regurgitation is trivial. Aortic Valve: Bioprosthetic aortic valve. Trivial perivalvular leakage. Visually the valve opens well, mean gradient 7 mmHg. EOA 2.25 cm^2. Aortic valve mean gradient measures 7.0 mmHg. Aortic valve peak gradient measures 13.5 mmHg. Aortic valve area, by  VTI measures 2.25 cm. Pulmonic Valve: The pulmonic valve was not well visualized. Pulmonic valve regurgitation is not visualized. Aorta: The aortic root is normal in size and structure. Venous: The inferior vena cava is dilated in size with less than 50% respiratory  variability, suggesting right atrial pressure of 15 mmHg. IAS/Shunts: No atrial level shunt detected by color flow Doppler. Additional Comments: A device lead is visualized in the right ventricle.  LEFT VENTRICLE PLAX  2D LVIDd:         5.00 cm LVIDs:         3.40 cm LV PW:         1.10 cm LV IVS:        0.90 cm LVOT diam:     1.90 cm LV SV:         80 LV SV Index:   38 LVOT Area:     2.84 cm  LV Volumes (MOD) LV vol d, MOD A2C: 168.0 ml LV vol d, MOD A4C: 114.0 ml LV vol s, MOD A2C: 76.0 ml LV vol s, MOD A4C: 55.7 ml LV SV MOD A2C:     92.0 ml LV SV MOD A4C:     114.0 ml LV SV MOD BP:      80.1 ml RIGHT VENTRICLE RV Basal diam:  5.00 cm RV Mid diam:    4.10 cm RV S prime:     5.03 cm/s TAPSE (M-mode): 0.8 cm LEFT ATRIUM             Index        RIGHT ATRIUM           Index LA diam:        4.70 cm 2.23 cm/m   RA Area:     24.90 cm LA Vol (A2C):   59.0 ml 27.98 ml/m  RA Volume:   85.40 ml  40.50 ml/m LA Vol (A4C):   59.2 ml 28.08 ml/m LA Biplane Vol: 61.6 ml 29.21 ml/m  AORTIC VALVE AV Area (Vmax):    2.43 cm AV Area (Vmean):   2.35 cm AV Area (VTI):     2.25 cm AV Vmax:           184.00 cm/s AV Vmean:          119.000 cm/s AV VTI:            0.356 m AV Peak Grad:      13.5 mmHg AV Mean Grad:      7.0 mmHg LVOT Vmax:         158.00 cm/s LVOT Vmean:        98.800 cm/s LVOT VTI:          0.283 m LVOT/AV VTI ratio: 0.79  AORTA Ao Root diam: 2.90 cm MITRAL VALVE                TRICUSPID VALVE MV Area (PHT): 4.80 cm     TR Peak grad:   43.0 mmHg MV Decel Time: 158 msec     TR Vmax:        328.00 cm/s MR Peak grad: 53.0 mmHg MR Vmax:      364.00 cm/s   SHUNTS MV E velocity: 107.00 cm/s  Systemic VTI:  0.28 m MV A velocity: 32.50 cm/s   Systemic Diam: 1.90 cm MV E/A ratio:  3.29 Dalton McleanMD Electronically signed by Ezra Kanner Signature Date/Time: 04/09/2024/4:05:33 PM    Final         Scheduled Meds:  allopurinol   300 mg Oral Daily   apixaban   5 mg Oral BID   budesonide -glycopyrrolate -formoterol    2 puff Inhalation BID   empagliflozin   10 mg Oral QHS   furosemide   60 mg Intravenous Q12H   gabapentin   300 mg Oral BID   levothyroxine   175 mcg Oral Q0600   montelukast   10 mg Oral QPM   pantoprazole   40 mg Oral BID   potassium chloride   SA  20 mEq Oral BID   predniSONE   2.5 mg Oral Q breakfast   roflumilast   500 mcg Oral Daily   rOPINIRole   1 mg Oral q morning   rosuvastatin   10 mg Oral QHS   spironolactone   12.5 mg Oral Daily   Continuous Infusions:   LOS: 0 days    Time spent: 35 Minutes    Consuelo Thayne A Alyxis Grippi, MD [facility]   If 7PM-7AM, please contact night-coverage [email or web address]  04/11/2024, 8:59 AM

## 2024-04-11 NOTE — Plan of Care (Signed)
   Problem: Activity: Goal: Risk for activity intolerance will decrease Outcome: Progressing   Problem: Nutrition: Goal: Adequate nutrition will be maintained Outcome: Progressing

## 2024-04-11 NOTE — Progress Notes (Signed)
 Patient more alert overnight. Patient states he would like to speak with the physician regarding plan of care and discharge timing.  States he likes to watch old westerns on the television Patient asking for wife, who typically visits in the morning, to sit with him. Pain controlled with current regimen. Patient encouraged to increase oral fluid intake and attempt small amounts of applesauce and Ensure; patient verbalized dislike but was educated on the importance of nutrition for recovery. Patient encouraged to be involved in care and to participate in physical therapy. No acute distress noted. Plan of care on going, will report off to the day shift RN.

## 2024-04-11 NOTE — Evaluation (Signed)
 Occupational Therapy Evaluation Patient Details Name: [name] MRN: [id number] DOB: 07/09/1950 Today's Date: 04/11/2024   History of Present Illness   [name] is a 73 y.o. male presents to the ED with complaint of chest pain, dyspnea, scrotal pain, left leg pain, weakness. PMH: atrial fibrillation, CHF, DM 2, COPD, GERD, hyperlipidemia, hypertension, right sided BKA, fibromyalgia, pacemaker     Clinical Impressions PTA, pt living with wife who assists with providing history. Pt needing cueing to maintain arousal to participate in history taking. Pt with excellent effort during assessment of mobility but ultimately max A for bed mobility limited by decreased strength and LLE and back pain. Unable to achieve fully upright sitting EOB, needing max A with posterior lean. OT to continue to follow. Wife present and supportive. Patient will benefit from continued inpatient follow up therapy, <3 hours/day      If plan is discharge home, recommend the following:   Two people to help with walking and/or transfers;Two people to help with bathing/dressing/bathroom;Assistance with cooking/housework;Assist for transportation;Help with stairs or ramp for entrance     Functional Status Assessment   Patient has had a recent decline in their functional status and demonstrates the ability to make significant improvements in function in a reasonable and predictable amount of time.     Equipment Recommendations   Other (comment) (defer)     Recommendations for Other Services         Precautions/Restrictions   Precautions Precautions: Fall Recall of Precautions/Restrictions: Impaired Precaution/Restrictions Comments: R BKA Restrictions Weight Bearing Restrictions Per Provider Order: No     Mobility Bed Mobility Overal bed mobility: Needs Assistance Bed Mobility: Rolling, Sidelying to Sit, Sit to Supine Rolling: Max assist Sidelying to sit: Max assist   Sit to  supine: Max assist   General bed mobility comments: pt needing max A for coming to EOB to bring BLE toward edge and trunk. unable to achieve fully upright with posterior lean. max A to bring BLE back into bed and reposition    Transfers                   General transfer comment: not attempted      Balance Overall balance assessment: Needs assistance Sitting-balance support: Bilateral upper extremity supported, Feet supported Sitting balance-Leahy Scale: Zero                                     ADL either performed or assessed with clinical judgement   ADL Overall ADL's : Needs assistance/impaired Eating/Feeding: Set up;Bed level   Grooming: Set up;Bed level   Upper Body Bathing: Maximal assistance;Bed level   Lower Body Bathing: Total assistance;+2 for physical assistance;+2 for safety/equipment   Upper Body Dressing : Maximal assistance;Bed level   Lower Body Dressing: Total assistance;+2 for physical assistance;+2 for safety/equipment;Bed level     Toilet Transfer Details (indicate cue type and reason): deferred                 Vision Patient Visual Report: No change from baseline       Perception         Praxis         Pertinent Vitals/Pain Pain Assessment Pain Assessment: Faces Faces Pain Scale: Hurts even more Pain Location: LLE with mobility Pain Descriptors / Indicators: Grimacing, Guarding, Moaning Pain Intervention(s): Limited activity within patient's tolerance, Monitored during session  Extremity/Trunk Assessment Upper Extremity Assessment Upper Extremity Assessment: Generalized weakness   Lower Extremity Assessment Lower Extremity Assessment: Defer to PT evaluation       Communication Communication Communication: Impaired Factors Affecting Communication:  (garbled speech at times suspect due to lethargy)   Cognition Arousal: Alert, Lethargic Behavior During Therapy: Flat affect Cognition: Cognition  impaired     Awareness: Online awareness impaired Memory impairment (select all impairments): Short-term memory, Declarative long-term memory Attention impairment (select first level of impairment): Sustained attention Executive functioning impairment (select all impairments): Problem solving, Sequencing, Organization, Reasoning OT - Cognition Comments: follows one step commands, intermittent lethargy during history taking needing cues for arousal.                 Following commands: Impaired Following commands impaired: Follows one step commands inconsistently, Follows one step commands with increased time     Cueing  General Comments   Cueing Techniques: Verbal cues;Gestural cues;Tactile cues;Visual cues      Exercises     Shoulder Instructions      Home Living Family/patient expects to be discharged to:: Private residence Living Arrangements: Spouse/significant other Available Help at Discharge: Family Type of Home: House Home Access: Ramped entrance     Home Layout: One level     Bathroom Shower/Tub: Chief Strategy Officer: Standard Bathroom Accessibility: Yes   Home Equipment: Agricultural Consultant (2 wheels);Wheelchair - manual;Shower seat          Prior Functioning/Environment Prior Level of Function : Needs assist             Mobility Comments: pt typically able to transfer to wheelchair via squat pivot; wears prosthesis out of house but predominantly still uses wheelchair. Transfers have been very difficult recently with multiple calls to the fire department to get him off of varying surfaces      OT Problem List: Decreased strength;Decreased activity tolerance;Impaired balance (sitting and/or standing);Decreased safety awareness;Decreased cognition;Decreased knowledge of use of DME or AE;Pain   OT Treatment/Interventions: Self-care/ADL training;Therapeutic exercise;DME and/or AE instruction;Therapeutic activities;Patient/family  education;Balance training      OT Goals(Current goals can be found in the care plan section)   Acute Rehab OT Goals Patient Stated Goal: get better OT Goal Formulation: With patient Time For Goal Achievement: 04/25/24 Potential to Achieve Goals: Good   OT Frequency:  Min 2X/week    Co-evaluation              AM-PAC OT 6 Clicks Daily Activity     Outcome Measure Help from another person eating meals?: A Little Help from another person taking care of personal grooming?: A Little Help from another person toileting, which includes using toliet, bedpan, or urinal?: Total Help from another person bathing (including washing, rinsing, drying)?: A Lot Help from another person to put on and taking off regular upper body clothing?: A Lot Help from another person to put on and taking off regular lower body clothing?: Total 6 Click Score: 12   End of Session Nurse Communication: Mobility status  Activity Tolerance: Patient limited by pain Patient left: in bed;with call bell/phone within reach;with bed alarm set;with family/visitor present  OT Visit Diagnosis: Unsteadiness on feet (R26.81);Muscle weakness (generalized) (M62.81);History of falling (Z91.81);Other symptoms and signs involving cognitive function;Pain Pain - Right/Left: Right Pain - part of body: Leg                Time: 8492-8459 OT Time Calculation (min): 33 min Charges:  OT General Charges $OT  Visit: 1 Visit OT Evaluation $OT Eval Moderate Complexity: 1 Mod OT Treatments $Self Care/Home Management : 8-22 mins  [name], OTR/L [facility] Acute Rehabilitation Office: [phone]   [name] 04/11/2024, 4:25 PM

## 2024-04-12 LAB — GLUCOSE, CAPILLARY
Glucose-Capillary: 123 mg/dL — ABNORMAL HIGH (ref 70–99)
Glucose-Capillary: 195 mg/dL — ABNORMAL HIGH (ref 70–99)
Glucose-Capillary: 141 mg/dL — ABNORMAL HIGH (ref 70–99)
Glucose-Capillary: 177 mg/dL — ABNORMAL HIGH (ref 70–99)

## 2024-04-12 LAB — BASIC METABOLIC PANEL WITH GFR
Anion gap: 11 (ref 5–15)
BUN: 43 mg/dL — ABNORMAL HIGH (ref 8–23)
CO2: 31 mmol/L (ref 22–32)
Calcium: 9.4 mg/dL (ref 8.9–10.3)
Chloride: 95 mmol/L — ABNORMAL LOW (ref 98–111)
Creatinine, Ser: 1.9 mg/dL — ABNORMAL HIGH (ref 0.61–1.24)
GFR, Estimated: 37 mL/min — ABNORMAL LOW (ref >60)
Glucose, Bld: 132 mg/dL — ABNORMAL HIGH (ref 70–99)
Potassium: 4.5 mmol/L (ref 3.5–5.1)
Sodium: 137 mmol/L (ref 135–145)

## 2024-04-12 LAB — CBC
HCT: 35.3 % — ABNORMAL LOW (ref 39.0–52.0)
Hemoglobin: 11.5 g/dL — ABNORMAL LOW (ref 13.0–17.0)
MCH: 33 pg (ref 26.0–34.0)
MCHC: 32.6 g/dL (ref 30.0–36.0)
MCV: 101.1 fL — ABNORMAL HIGH (ref 80.0–100.0)
Platelets: 162 K/uL (ref 150–400)
RBC: 3.49 MIL/uL — ABNORMAL LOW (ref 4.22–5.81)
RDW: 17.4 % — ABNORMAL HIGH (ref 11.5–15.5)
WBC: 7.4 K/uL (ref 4.0–10.5)
nRBC: 0 % (ref 0.0–0.2)

## 2024-04-12 MED ORDER — FUROSEMIDE 10 MG/ML IJ SOLN
20.0000 mg | Freq: Once | INTRAMUSCULAR | Status: AC
  Administered 2024-04-12: 11:00:00 20 mg via INTRAVENOUS
  Filled 2024-04-12: qty 2

## 2024-04-12 MED ORDER — TORSEMIDE 20 MG PO TABS
40.0000 mg | ORAL_TABLET | Freq: Every day | ORAL | Status: DC
  Administered 2024-04-13 – 2024-04-14 (×2): 40 mg via ORAL
  Filled 2024-04-12 (×2): qty 2

## 2024-04-12 MED ORDER — IPRATROPIUM-ALBUTEROL 0.5-2.5 (3) MG/3ML IN SOLN: 3.0000 mL | Freq: Four times a day (QID) | RESPIRATORY_TRACT | Status: DC | PRN

## 2024-04-12 NOTE — Progress Notes (Signed)
 Patient resting comfortably. Pain controlled with current regimen. Repositioned frequently with pillow support. In good spirits, forgetful at times, which is his baseline. Plan of care on going.

## 2024-04-12 NOTE — NC FL2 (Signed)
 [place]  MEDICAID FL2 LEVEL OF CARE FORM     IDENTIFICATION  Patient Name: [name] Birthdate: 05/07/1950 Sex: male Admission Date (Current Location): 04/08/2024  [facility] and [place] Number:  Producer, Television/film/video and Address:  [facility],  [address] [place], [place] [address]      Provider Number: [id number]  Attending Physician Name and Address:  Madelyne Owen LABOR, MD  Relative Name and Phone Number:  Christie(spouse) [phone]    Current Level of Care: Hospital Recommended Level of Care: Skilled Nursing Facility Prior Approval Number:    Date Approved/Denied:   PASRR Number: [id number]  Discharge Plan: SNF    Current Diagnoses: Patient Active Problem List   Diagnosis Date Noted   Acute on chronic diastolic congestive heart failure (HCC) 04/09/2024   Macrocytic anemia 04/09/2024   Intractable abdominal pain 01/02/2024   Cervical radiculopathy 01/02/2024   Spinal stenosis in cervical region 01/02/2024   Gouty arthritis 01/02/2024   Morbid obesity (HCC) 01/02/2024   Renal cell carcinoma of left kidney (HCC) 01/02/2024   Intractable nausea 01/02/2024   AKI (acute kidney injury) 12/22/2022   Fall 11/14/2022   Pain of right lower extremity due to injury 11/14/2022   Diabetic ulcer of lower leg (HCC) 11/13/2022   Osteomyelitis (HCC) 11/13/2022   Dyslipidemia 10/18/2022   Cellulitis of right lower extremity 10/17/2022   Dehiscence of amputation stump of right lower extremity (HCC) 10/09/2022   Below-knee amputation of right lower extremity (HCC) 10/09/2022   Gangrene of right foot (HCC) 09/04/2022   History of below-knee amputation of right lower extremity (HCC) 09/04/2022   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) 08/23/2022   CAP (community acquired pneumonia) 12/28/2021   Constipation 12/28/2021   Pain in both lower extremities 07/03/2021   Peripheral neuropathy 07/03/2021   Acute metabolic encephalopathy 06/09/2021   Marijuana  dependence (HCC) 06/09/2021   DNR (do not resuscitate) 06/09/2021   Chest pain 06/08/2021   Renal lesion 02/14/2021   Status post cryoablation 02/14/2021   Allergic rhinitis 12/08/2020   Preop pulmonary/respiratory exam 03/27/2020   Acute on chronic diastolic (congestive) heart failure (HCC) 01/22/2020   Abdominal pain 01/22/2020   Hypothyroidism 12/13/2019   DM2 (diabetes mellitus, type 2) (HCC) 12/13/2019   Chronic kidney disease, stage 3b (HCC) 12/13/2019   Acute bacterial bronchitis 12/13/2019   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   Typical atrial flutter (HCC)    Chronic respiratory failure with hypoxia (HCC) 05/23/2016   CAD (coronary artery disease) 05/23/2016   Persistent atrial fibrillation (HCC)    Atypical atrial flutter (HCC)    Visit for monitoring Tikosyn  therapy 04/16/2016   Chronic diastolic CHF (congestive heart failure) (HCC) 02/03/2016   Cough 12/06/2015   Acute on chronic respiratory failure with hypoxia (HCC) 09/08/2015   Hallucinations 09/04/2015   Lymphadenopathy 09/04/2015   Ascending aortic aneurysm 09/04/2015   Hypoxia 09/02/2015   S/P aortic valve replacement with bioprosthetic valve 09/02/2015   Pleural effusion 09/02/2015   S/P AVR 08/22/2015   Umbilical hernia 12/12/2014   GERD without esophagitis 09/20/2014   Fibromyalgia 06/14/2014   Restless legs syndrome 06/14/2014   Intrinsic asthma 10/13/2013   Dyspnea 09/15/2013   Pulmonary hypertension (HCC) 09/15/2013   Shortness of breath 05/05/2013   Pacemaker-St.Jude 04/01/2012   Bradycardia 03/31/2012   Second degree AV block 03/31/2012   AV block, 1st degree 02/01/2012   PAF (paroxysmal atrial fibrillation) (HCC) 11/13/2011   Fatigue 11/13/2011   Essential hypertension 11/13/2011  Aortic stenosis 11/13/2011   Sleep apnea 11/13/2011    Orientation RESPIRATION BLADDER Height & Weight     Self, Time, Situation, Place  Normal Continent Weight: 85.6 kg Height:  5' 11 (180.3 cm)  BEHAVIORAL  SYMPTOMS/MOOD NEUROLOGICAL BOWEL NUTRITION STATUS      Continent Diet (Heart Healthy)  AMBULATORY STATUS COMMUNICATION OF NEEDS Skin   Limited Assist Verbally Normal                       Personal Care Assistance Level of Assistance  Bathing, Feeding, Dressing Bathing Assistance: Limited assistance Feeding assistance: Limited assistance Dressing Assistance: Limited assistance     Functional Limitations Info  Sight, Hearing, Speech Sight Info: Impaired (eyeglasses) Hearing Info: Adequate Speech Info: Adequate    SPECIAL CARE FACTORS FREQUENCY  PT (By licensed PT), OT (By licensed OT)     PT Frequency: 5x week OT Frequency: 5x week            Contractures Contractures Info: Not present    Additional Factors Info  Code Status, Allergies Code Status Info: DNR Allergies Info: Cephalexin , Meloxicam, Vancomycin            Current Medications (04/12/2024):  This is the current hospital active medication list Current Facility-Administered Medications  Medication Dose Route Frequency Provider Last Rate Last Admin   acetaminophen  (TYLENOL ) tablet 650 mg  650 mg Oral Q6H PRN Celinda Alm Lot, MD   650 mg at 04/12/24 9447   Or   acetaminophen  (TYLENOL ) suppository 650 mg  650 mg Rectal Q6H PRN Celinda Alm Lot, MD       allopurinol  (ZYLOPRIM ) tablet 300 mg  300 mg Oral Daily Celinda Alm Lot, MD   300 mg at 04/12/24 9047   alum & mag hydroxide-simeth (MAALOX/MYLANTA) 200-200-20 MG/5ML suspension 30 mL  30 mL Oral Q6H PRN Elnor Savant A, DO       apixaban  (ELIQUIS ) tablet 5 mg  5 mg Oral BID Patt Alm Macho, MD   5 mg at 04/12/24 1003   budesonide -glycopyrrolate -formoterol  (BREZTRI ) 160-9-4.8 MCG/ACT inhaler 2 puff  2 puff Inhalation BID Celinda Alm Lot, MD   2 puff at 04/12/24 0815   colchicine  tablet 0.6 mg  0.6 mg Oral Daily PRN Celinda Alm Lot, MD       cyclobenzaprine  (FLEXERIL ) tablet 2.5-5 mg  2.5-5 mg Oral BID PRN Celinda Alm Lot, MD   5 mg at  04/12/24 0124   empagliflozin  (JARDIANCE ) tablet 10 mg  10 mg Oral QHS Celinda Alm Lot, MD   10 mg at 04/11/24 2034   gabapentin  (NEURONTIN ) capsule 300 mg  300 mg Oral BID Krishnan, Gokul, MD   300 mg at 04/12/24 1004   ipratropium-albuterol  (DUONEB) 0.5-2.5 (3) MG/3ML nebulizer solution 3 mL  3 mL Nebulization Q6H PRN Regalado, Belkys A, MD       levothyroxine  (SYNTHROID ) tablet 175 mcg  175 mcg Oral Q0600 Patt Alm Macho, MD   175 mcg at 04/12/24 0551   montelukast  (SINGULAIR ) tablet 10 mg  10 mg Oral QPM Celinda Alm Lot, MD   10 mg at 04/11/24 1722   morphine  (PF) 2 MG/ML injection 1 mg  1 mg Intravenous Q4H PRN Krishnan, Gokul, MD       ondansetron  (ZOFRAN ) tablet 4 mg  4 mg Oral Q8H PRN Elnor Savant LABOR, DO       oxyCODONE -acetaminophen  (PERCOCET/ROXICET) 5-325 MG per tablet 1 tablet  1 tablet Oral TID PRN Patt Alm Macho, MD  1 tablet at 04/10/24 1021   And   oxyCODONE  (Oxy IR/ROXICODONE ) immediate release tablet 5 mg  5 mg Oral TID PRN Yao, David Hsienta, MD   5 mg at 04/12/24 1126   pantoprazole  (PROTONIX ) EC tablet 40 mg  40 mg Oral BID Yao, David Hsienta, MD   40 mg at 04/12/24 0955   polyethylene glycol (MIRALAX  / GLYCOLAX ) packet 17 g  17 g Oral Q48H PRN Celinda Alm Lot, MD       potassium chloride  SA (KLOR-CON  M) CR tablet 20 mEq  20 mEq Oral BID Celinda Alm Lot, MD   20 mEq at 04/12/24 9044   predniSONE  (DELTASONE ) tablet 2.5 mg  2.5 mg Oral Q breakfast Celinda Alm Lot, MD   2.5 mg at 04/12/24 0802   roflumilast  (DALIRESP ) tablet 500 mcg  500 mcg Oral Daily Celinda Alm Lot, MD   500 mcg at 04/12/24 9043   rOPINIRole  (REQUIP ) tablet 1 mg  1 mg Oral q morning Patt Alm Macho, MD   1 mg at 04/12/24 1003   rosuvastatin  (CRESTOR ) tablet 10 mg  10 mg Oral QHS Celinda Alm Lot, MD   10 mg at 04/11/24 2034   spironolactone  (ALDACTONE ) tablet 12.5 mg  12.5 mg Oral Daily Celinda Alm Lot, MD   12.5 mg at 04/12/24 1003   [START ON 04/13/2024] torsemide   (DEMADEX ) tablet 40 mg  40 mg Oral Daily Regalado, Belkys A, MD         Discharge Medications: Please see discharge summary for a list of discharge medications.  Relevant Imaging Results:  Relevant Lab Results:   Additional Information ss#[id number] [name], [place]

## 2024-04-12 NOTE — Progress Notes (Signed)
°   04/12/24 1120  Spiritual Encounters  Type of Visit Initial  Care provided to: Patient  Referral source Nurse (RN/NT/LPN)  Reason for visit Advance directives  OnCall Visit No  Interventions  Spiritual Care Interventions Made Established relationship of care and support;Other (comment) (brief education mz:[id number])   I responded to a spiritual care consult by Marcey Lands, RN to discuss advanced directive with [name].  [name] was resting comfortably, welcomed my visit and I introduced myself and spiritual care services generally. Asked some questions to assess needs around planning and thoughts on creating HCPOA. [name] expressed feeling comfortable with spouse, [name], to be health care decision maker but said he also has a sister that could speak if needed (did not wish to do HCPOA at this time). I shared the decision was his but wanted to encourage discussion. Offered to return to offer support for [place] later and to answer questions she may have.  Vanessa Alesi L. Delores HERO.Div

## 2024-04-12 NOTE — Progress Notes (Signed)
 PROGRESS NOTE    [name]  FMW:[id number] DOB: 10-31-1950 DOA: 04/08/2024 PCP: Larnell Hamilton, MD   Brief Narrative: 73 year old with past medical history significant for mild aortic stenosis, osteoarthritis of the hand, asthma, A-fib, asthma, COPD, DDD, fatty liver, fibromyalgia, GERD, hyperlipidemia, hypothyroidism, history of intractable abdominal pain, cannabis dependence, previous morbid obesity, restless leg syndrome, cervical spinal stenosis, hypertension, first-degree AV block, second-degree AV block, history of pacemaker, chronic diastolic heart failure who was seen for chest pain in the ED subsequently hospitalist consulted for worsening dyspnea over the last few weeks with orthopnea, intermittent episode of precordial sharp chest pain in nature nonradiating.  Admitted for heart failure exacerbation.  Assessment & Plan:   Principal Problem:   Acute on chronic diastolic congestive heart failure (HCC) Active Problems:   Hypothyroidism   DM2 (diabetes mellitus, type 2) (HCC)   Chronic kidney disease, stage 3b (HCC)   PAF (paroxysmal atrial fibrillation) (HCC)   Essential hypertension   Sleep apnea   GERD without esophagitis   CAD (coronary artery disease)   COPD mixed type (HCC)   Gouty arthritis   Macrocytic anemia  1-Acute on Chronic Systolic Diastolic heart failure exacerbation: -Patient developed worsening shortness of breath, orthopnea precordial chest pain Echo showed left ventricular ejection fraction 45 to 50%, ejection fraction appears to have decreased compared to echo from February - Currently holding bisoprolol  due to acute heart failure exacerbation - Previous weight 220 pounds.  Monitor daily weight; negative 4 L -Treated with IV lasix . Transition to Torsemide  starting tomorrow.  -4.3 L  Chronic abdominal pain: Patient has a history of chronic abdominal pain.  CT abdomen and pelvis not show any acute finding No significant bladder or urinary  retention  Chronic kidney disease stage IIIb Baseline around 1.8 Monitor on diuretic Present with a creatinine of 2.0, stable at 1.9  Diabetes type 2: Continue sliding scale insulin   Paroxysmal A-fib/essential hypertension/CAD Continue apixaban   Hypothyroidism - Continue levothyroxine   Mild thrombocytopenia - Monitor  History of COPD On chronic 2 L of oxygen  Continue with inhaler.  Will add scheduled DuoNeb  Gout arthritis Continue allopurinol   Microcytic anemia Iron low, TSAT 7.  Folate more than 20, B12 975.  He will need IV iron     Estimated body mass index is 26.32 kg/m as calculated from the following:   Height as of this encounter: 5' 11 (1.803 m).   Weight as of this encounter: 85.6 kg.   DVT prophylaxis: Eliquis  Code Status: DNR Family Communication: No family at bedside Disposition Plan:  Status is: Observation The patient remains OBS appropriate and will d/c before 2 midnights.    Consultants:  None  Procedures:  none  Antimicrobials:    Subjective: He is alert, report breathing better. Abdominal pain stable. No worsening pain.   Objective: Vitals:   04/11/24 2054 04/12/24 0443 04/12/24 0700 04/12/24 0721  BP: 126/67 126/74    Pulse: 63 63    Resp: (!) 22 20    Temp: 98.1 F (36.7 C) 97.8 F (36.6 C)    TempSrc: Oral Oral    SpO2: 99% 93% 96%   Weight:    85.6 kg  Height:        Intake/Output Summary (Last 24 hours) at 04/12/2024 1325 Last data filed at 04/12/2024 1100 Gross per 24 hour  Intake 240 ml  Output 2250 ml  Net -2010 ml   Filed Weights   04/09/24 1831 04/11/24 0437 04/12/24 0721  Weight: 99.8 kg 85.1 kg  85.6 kg    Examination:  General exam: NAD Respiratory system:  CTA Cardiovascular system: S 1,S 2 RRR Gastrointestinal system: BS present, soft, nt Central nervous system: Alert Extremities: no edema   Data Reviewed: I have personally reviewed following labs and imaging studies  CBC: Recent Labs   Lab 04/08/24 1245 04/09/24 0811 04/10/24 0529 04/11/24 0546 04/12/24 0421  WBC 6.2 6.7 8.1 7.3 7.4  NEUTROABS 4.8 5.5  --   --   --   HGB 11.5* 9.9* 11.8* 11.6* 11.5*  HCT 36.5* 32.3* 37.5* 36.3* 35.3*  MCV 102.2* 105.9* 102.5* 102.8* 101.1*  PLT 132* 106* 129* 141* 162   Basic Metabolic Panel: Recent Labs  Lab 04/08/24 1245 04/09/24 0811 04/10/24 0529 04/11/24 0546 04/12/24 0421  NA 140 143 142 141 137  K 3.6 3.8 3.9 4.1 4.5  CL 97* 104 100 99 95*  CO2 32 32 31 31 31   GLUCOSE 201* 131* 136* 161* 132*  BUN 40* 30* 31* 37* 43*  CREATININE 2.08* 1.63* 1.61* 1.90* 1.90*  CALCIUM  10.1 8.8* 9.7 9.5 9.4  MG  --  2.2  --   --   --   PHOS  --   --  2.9  --   --    GFR: Estimated Creatinine Clearance: 36.3 mL/min (A) (by C-G formula based on SCr of 1.9 mg/dL (H)). Liver Function Tests: Recent Labs  Lab 04/08/24 1245 04/09/24 0811 04/10/24 0529  AST 34 26  --   ALT 28 23  --   ALKPHOS 128* 120  --   BILITOT 0.6 0.8  --   PROT 7.8 6.9  --   ALBUMIN  4.5 3.8 4.2   Recent Labs  Lab 04/08/24 1245  LIPASE 30   No results for input(s): AMMONIA in the last 168 hours. Coagulation Profile: No results for input(s): INR, PROTIME in the last 168 hours. Cardiac Enzymes: No results for input(s): CKTOTAL, CKMB, CKMBINDEX, TROPONINI in the last 168 hours. BNP (last 3 results) Recent Labs    04/08/24 1245  PROBNP 1,840.0*   HbA1C: Recent Labs    04/11/24 0546  HGBA1C 6.2*   CBG: Recent Labs  Lab 04/11/24 1146 04/11/24 1702 04/11/24 2046 04/12/24 0733 04/12/24 1140  GLUCAP 164* 162* 155* 123* 195*   Lipid Profile: No results for input(s): CHOL, HDL, LDLCALC, TRIG, CHOLHDL, LDLDIRECT in the last 72 hours. Thyroid  Function Tests: No results for input(s): TSH, T4TOTAL, FREET4, T3FREE, THYROIDAB in the last 72 hours. Anemia Panel: Recent Labs    04/11/24 0546 04/11/24 0600  VITAMINB12  --  975*  FOLATE  --  >20.0  FERRITIN   --  1,059*  TIBC  --  298  IRON  --  21*  RETICCTPCT 2.7  --    Sepsis Labs: No results for input(s): PROCALCITON, LATICACIDVEN in the last 168 hours.  Recent Results (from the past 240 hours)  Resp panel by RT-PCR (RSV, Flu A&B, Covid) Anterior Nasal Swab     Status: None   Collection Time: 04/08/24  1:00 PM   Specimen: Anterior Nasal Swab  Result Value Ref Range Status   SARS Coronavirus 2 by RT PCR NEGATIVE NEGATIVE Final    Comment: (NOTE) SARS-CoV-2 target nucleic acids are NOT DETECTED.  The SARS-CoV-2 RNA is generally detectable in upper respiratory specimens during the acute phase of infection. The lowest concentration of SARS-CoV-2 viral copies this assay can detect is 138 copies/mL. A negative result does not preclude SARS-Cov-2 infection and should not  be used as the sole basis for treatment or other patient management decisions. A negative result may occur with  improper specimen collection/handling, submission of specimen other than nasopharyngeal swab, presence of viral mutation(s) within the areas targeted by this assay, and inadequate number of viral copies(<138 copies/mL). A negative result must be combined with clinical observations, patient history, and epidemiological information. The expected result is Negative.  Fact Sheet for Patients:  bloggercourse.com  Fact Sheet for Healthcare Providers:  seriousbroker.it  This test is no t yet approved or cleared by the [place]  FDA and  has been authorized for detection and/or diagnosis of SARS-CoV-2 by FDA under an Emergency Use Authorization (EUA). This EUA will remain  in effect (meaning this test can be used) for the duration of the COVID-19 declaration under Section 564(b)(1) of the Act, 21 U.S.C.section 360bbb-3(b)(1), unless the authorization is terminated  or revoked sooner.       Influenza A by PCR NEGATIVE NEGATIVE Final   Influenza B by  PCR NEGATIVE NEGATIVE Final    Comment: (NOTE) The Xpert Xpress SARS-CoV-2/FLU/RSV plus assay is intended as an aid in the diagnosis of influenza from Nasopharyngeal swab specimens and should not be used as a sole basis for treatment. Nasal washings and aspirates are unacceptable for Xpert Xpress SARS-CoV-2/FLU/RSV testing.  Fact Sheet for Patients: bloggercourse.com  Fact Sheet for Healthcare Providers: seriousbroker.it  This test is not yet approved or cleared by the [place]  FDA and has been authorized for detection and/or diagnosis of SARS-CoV-2 by FDA under an Emergency Use Authorization (EUA). This EUA will remain in effect (meaning this test can be used) for the duration of the COVID-19 declaration under Section 564(b)(1) of the Act, 21 U.S.C. section 360bbb-3(b)(1), unless the authorization is terminated or revoked.     Resp Syncytial Virus by PCR NEGATIVE NEGATIVE Final    Comment: (NOTE) Fact Sheet for Patients: bloggercourse.com  Fact Sheet for Healthcare Providers: seriousbroker.it  This test is not yet approved or cleared by the [place]  FDA and has been authorized for detection and/or diagnosis of SARS-CoV-2 by FDA under an Emergency Use Authorization (EUA). This EUA will remain in effect (meaning this test can be used) for the duration of the COVID-19 declaration under Section 564(b)(1) of the Act, 21 U.S.C. section 360bbb-3(b)(1), unless the authorization is terminated or revoked.  Performed at [facility], [address] [place], [place] [address]          Radiology Studies: No results found.       Scheduled Meds:  allopurinol   300 mg Oral Daily   apixaban   5 mg Oral BID   budesonide -glycopyrrolate -formoterol   2 puff Inhalation BID   empagliflozin   10 mg Oral QHS   gabapentin   300 mg Oral BID   levothyroxine    175 mcg Oral Q0600   montelukast   10 mg Oral QPM   pantoprazole   40 mg Oral BID   potassium chloride  SA  20 mEq Oral BID   predniSONE   2.5 mg Oral Q breakfast   roflumilast   500 mcg Oral Daily   rOPINIRole   1 mg Oral q morning   rosuvastatin   10 mg Oral QHS   spironolactone   12.5 mg Oral Daily   [START ON 04/13/2024] torsemide   40 mg Oral Daily   Continuous Infusions:   LOS: 1 day    Time spent: 35 Minutes    Jailynn Lavalais A Deloris Moger, MD [facility]   If 7PM-7AM, please contact night-coverage [email or web address]  04/12/2024,  1:25 PM

## 2024-04-12 NOTE — NC FL2 (Signed)
 [place]  MEDICAID FL2 LEVEL OF CARE FORM     IDENTIFICATION  Patient Name: [name] Birthdate: February 25, 1951 Sex: male Admission Date (Current Location): 04/08/2024  [facility] and [place] Number:  Producer, Television/film/video and Address:  [facility],  [address] [place], [place] [address]      Provider Number: [id number]  Attending Physician Name and Address:  Madelyne Owen LABOR, MD  Relative Name and Phone Number:  Christie(spouse) [phone]    Current Level of Care: Hospital Recommended Level of Care: Skilled Nursing Facility Prior Approval Number:    Date Approved/Denied:   PASRR Number: [id number]  Discharge Plan: SNF    Current Diagnoses: Patient Active Problem List   Diagnosis Date Noted   Acute on chronic diastolic congestive heart failure (HCC) 04/09/2024   Macrocytic anemia 04/09/2024   Intractable abdominal pain 01/02/2024   Cervical radiculopathy 01/02/2024   Spinal stenosis in cervical region 01/02/2024   Gouty arthritis 01/02/2024   Morbid obesity (HCC) 01/02/2024   Renal cell carcinoma of left kidney (HCC) 01/02/2024   Intractable nausea 01/02/2024   AKI (acute kidney injury) 12/22/2022   Fall 11/14/2022   Pain of right lower extremity due to injury 11/14/2022   Diabetic ulcer of lower leg (HCC) 11/13/2022   Osteomyelitis (HCC) 11/13/2022   Dyslipidemia 10/18/2022   Cellulitis of right lower extremity 10/17/2022   Dehiscence of amputation stump of right lower extremity (HCC) 10/09/2022   Below-knee amputation of right lower extremity (HCC) 10/09/2022   Gangrene of right foot (HCC) 09/04/2022   History of below-knee amputation of right lower extremity (HCC) 09/04/2022   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) 08/23/2022   CAP (community acquired pneumonia) 12/28/2021   Constipation 12/28/2021   Pain in both lower extremities 07/03/2021   Peripheral neuropathy 07/03/2021   Acute metabolic encephalopathy 06/09/2021   Marijuana  dependence (HCC) 06/09/2021   DNR (do not resuscitate) 06/09/2021   Chest pain 06/08/2021   Renal lesion 02/14/2021   Status post cryoablation 02/14/2021   Allergic rhinitis 12/08/2020   Preop pulmonary/respiratory exam 03/27/2020   Acute on chronic diastolic (congestive) heart failure (HCC) 01/22/2020   Abdominal pain 01/22/2020   Hypothyroidism 12/13/2019   DM2 (diabetes mellitus, type 2) (HCC) 12/13/2019   Chronic kidney disease, stage 3b (HCC) 12/13/2019   Acute bacterial bronchitis 12/13/2019   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   Typical atrial flutter (HCC)    Chronic respiratory failure with hypoxia (HCC) 05/23/2016   CAD (coronary artery disease) 05/23/2016   Persistent atrial fibrillation (HCC)    Atypical atrial flutter (HCC)    Visit for monitoring Tikosyn  therapy 04/16/2016   Chronic diastolic CHF (congestive heart failure) (HCC) 02/03/2016   Cough 12/06/2015   Acute on chronic respiratory failure with hypoxia (HCC) 09/08/2015   Hallucinations 09/04/2015   Lymphadenopathy 09/04/2015   Ascending aortic aneurysm 09/04/2015   Hypoxia 09/02/2015   S/P aortic valve replacement with bioprosthetic valve 09/02/2015   Pleural effusion 09/02/2015   S/P AVR 08/22/2015   Umbilical hernia 12/12/2014   GERD without esophagitis 09/20/2014   Fibromyalgia 06/14/2014   Restless legs syndrome 06/14/2014   Intrinsic asthma 10/13/2013   Dyspnea 09/15/2013   Pulmonary hypertension (HCC) 09/15/2013   Shortness of breath 05/05/2013   Pacemaker-St.Jude 04/01/2012   Bradycardia 03/31/2012   Second degree AV block 03/31/2012   AV block, 1st degree 02/01/2012   PAF (paroxysmal atrial fibrillation) (HCC) 11/13/2011   Fatigue 11/13/2011   Essential hypertension 11/13/2011  Aortic stenosis 11/13/2011   Sleep apnea 11/13/2011    Orientation RESPIRATION BLADDER Height & Weight     Self, Time, Situation, Place  O2 (02 continuous) Continent Weight: 85.6 kg Height:  5' 11 (180.3 cm)   BEHAVIORAL SYMPTOMS/MOOD NEUROLOGICAL BOWEL NUTRITION STATUS      Continent Diet (Heart Healthy)  AMBULATORY STATUS COMMUNICATION OF NEEDS Skin   Limited Assist Verbally Normal                       Personal Care Assistance Level of Assistance  Bathing, Feeding, Dressing Bathing Assistance: Limited assistance Feeding assistance: Limited assistance Dressing Assistance: Limited assistance     Functional Limitations Info  Sight, Hearing, Speech Sight Info: Impaired (eyeglasses) Hearing Info: Adequate Speech Info: Adequate    SPECIAL CARE FACTORS FREQUENCY  PT (By licensed PT), OT (By licensed OT)     PT Frequency: 5x week OT Frequency: 5x week            Contractures Contractures Info: Not present    Additional Factors Info  Code Status, Allergies Code Status Info: DNR Allergies Info: Cephalexin , Meloxicam, Vancomycin            Current Medications (04/12/2024):  This is the current hospital active medication list Current Facility-Administered Medications  Medication Dose Route Frequency Provider Last Rate Last Admin   acetaminophen  (TYLENOL ) tablet 650 mg  650 mg Oral Q6H PRN Celinda Alm Lot, MD   650 mg at 04/12/24 9447   Or   acetaminophen  (TYLENOL ) suppository 650 mg  650 mg Rectal Q6H PRN Celinda Alm Lot, MD       allopurinol  (ZYLOPRIM ) tablet 300 mg  300 mg Oral Daily Celinda Alm Lot, MD   300 mg at 04/12/24 9047   alum & mag hydroxide-simeth (MAALOX/MYLANTA) 200-200-20 MG/5ML suspension 30 mL  30 mL Oral Q6H PRN Elnor Savant A, DO       apixaban  (ELIQUIS ) tablet 5 mg  5 mg Oral BID Yao, David Hsienta, MD   5 mg at 04/12/24 1003   budesonide -glycopyrrolate -formoterol  (BREZTRI ) 160-9-4.8 MCG/ACT inhaler 2 puff  2 puff Inhalation BID Celinda Alm Lot, MD   2 puff at 04/12/24 0815   colchicine  tablet 0.6 mg  0.6 mg Oral Daily PRN Celinda Alm Lot, MD       cyclobenzaprine  (FLEXERIL ) tablet 2.5-5 mg  2.5-5 mg Oral BID PRN Celinda Alm Lot,  MD   5 mg at 04/12/24 0124   empagliflozin  (JARDIANCE ) tablet 10 mg  10 mg Oral QHS Celinda Alm Lot, MD   10 mg at 04/11/24 2034   gabapentin  (NEURONTIN ) capsule 300 mg  300 mg Oral BID Krishnan, Gokul, MD   300 mg at 04/12/24 1004   ipratropium-albuterol  (DUONEB) 0.5-2.5 (3) MG/3ML nebulizer solution 3 mL  3 mL Nebulization Q6H PRN Regalado, Belkys A, MD       levothyroxine  (SYNTHROID ) tablet 175 mcg  175 mcg Oral Q0600 Patt Alm Macho, MD   175 mcg at 04/12/24 0551   montelukast  (SINGULAIR ) tablet 10 mg  10 mg Oral QPM Celinda Alm Lot, MD   10 mg at 04/11/24 1722   morphine  (PF) 2 MG/ML injection 1 mg  1 mg Intravenous Q4H PRN Krishnan, Gokul, MD       ondansetron  (ZOFRAN ) tablet 4 mg  4 mg Oral Q8H PRN Elnor Savant A, DO       oxyCODONE -acetaminophen  (PERCOCET/ROXICET) 5-325 MG per tablet 1 tablet  1 tablet Oral TID PRN Patt Alm Macho,  MD   1 tablet at 04/10/24 1021   And   oxyCODONE  (Oxy IR/ROXICODONE ) immediate release tablet 5 mg  5 mg Oral TID PRN Yao, David Hsienta, MD   5 mg at 04/12/24 1126   pantoprazole  (PROTONIX ) EC tablet 40 mg  40 mg Oral BID Yao, David Hsienta, MD   40 mg at 04/12/24 0955   polyethylene glycol (MIRALAX  / GLYCOLAX ) packet 17 g  17 g Oral Q48H PRN Celinda Alm Lot, MD       potassium chloride  SA (KLOR-CON  M) CR tablet 20 mEq  20 mEq Oral BID Celinda Alm Lot, MD   20 mEq at 04/12/24 9044   predniSONE  (DELTASONE ) tablet 2.5 mg  2.5 mg Oral Q breakfast Celinda Alm Lot, MD   2.5 mg at 04/12/24 0802   roflumilast  (DALIRESP ) tablet 500 mcg  500 mcg Oral Daily Celinda Alm Lot, MD   500 mcg at 04/12/24 9043   rOPINIRole  (REQUIP ) tablet 1 mg  1 mg Oral q morning Yao, David Hsienta, MD   1 mg at 04/12/24 1003   rosuvastatin  (CRESTOR ) tablet 10 mg  10 mg Oral QHS Celinda Alm Lot, MD   10 mg at 04/11/24 2034   spironolactone  (ALDACTONE ) tablet 12.5 mg  12.5 mg Oral Daily Celinda Alm Lot, MD   12.5 mg at 04/12/24 1003   [START ON 04/13/2024]  torsemide  (DEMADEX ) tablet 40 mg  40 mg Oral Daily Regalado, Belkys A, MD         Discharge Medications: Please see discharge summary for a list of discharge medications.  Relevant Imaging Results:  Relevant Lab Results:   Additional Information ss[address][id number], [name], [place]

## 2024-04-12 NOTE — Progress Notes (Signed)
 Physical Therapy Treatment Patient Details Name: [name] MRN: [id number] DOB: 1950-08-20 Today's Date: 04/12/2024   History of Present Illness [name] is a 73 y.o. male presents to the ED with complaint of chest pain, dyspnea, scrotal pain, left leg pain, weakness. PMH: atrial fibrillation, CHF, DM 2, COPD, GERD, hyperlipidemia, hypertension, right sided BKA, fibromyalgia, pacemaker    PT Comments  Pt reports 8/10 pain all over, but especially in LLE with movement and touch. Mod assist for bed mobility. Pt sat at edge of bed for ~10 minutes with intermittent min assist for trunk control 2* posterior lean. Pt performed LLE exercises and forward reaching activity. Pt reported nausea at end of session, RN notified. Transfers not attempted 2* poor sitting balance and nausea.    If plan is discharge home, recommend the following: Two people to help with walking and/or transfers;A lot of help with bathing/dressing/bathroom;Assistance with cooking/housework;Assist for transportation   Can travel by private vehicle     No  Equipment Recommendations  None recommended by PT    Recommendations for Other Services       Precautions / Restrictions Precautions Precautions: Fall Recall of Precautions/Restrictions: Impaired Precaution/Restrictions Comments: R BKA Restrictions Weight Bearing Restrictions Per Provider Order: No     Mobility  Bed Mobility Overal bed mobility: Needs Assistance Bed Mobility: Rolling, Sidelying to Sit, Sit to Supine Rolling: Mod assist, +2 for physical assistance Sidelying to sit: Mod assist   Sit to supine: Mod assist   General bed mobility comments: assist to initiate roll, VCs to reach for rail, assist to raise trunk to sit; assist for LLE back into bed; pt sat EOB x 10 minutes with poor sitting balance    Transfers                   General transfer comment: not attempted    Ambulation/Gait                   Stairs              Wheelchair Mobility     Tilt Bed    Modified Rankin (Stroke Patients Only)       Balance Overall balance assessment: Needs assistance Sitting-balance support: Bilateral upper extremity supported, Feet supported Sitting balance-Leahy Scale: Poor Sitting balance - Comments: min A for frequent posterior lean, at times able to maintain trunk in neutral with repeated verbal cues Postural control: Posterior lean                     High Level Balance Comments: forward reaching            Communication Communication Communication: Impaired Factors Affecting Communication: Difficulty expressing self (increased time to respond to questions)  Cognition Arousal: Alert, Lethargic Behavior During Therapy: Flat affect                             Following commands: Impaired Following commands impaired: Follows one step commands inconsistently, Follows one step commands with increased time    Cueing Cueing Techniques: Verbal cues, Gestural cues, Tactile cues, Visual cues  Exercises General Exercises - Lower Extremity Ankle Circles/Pumps: PROM, Left, 15 reps, Seated Long Arc Quad: AROM, Left, 10 reps, Seated    General Comments        Pertinent Vitals/Pain Pain Assessment Pain Score: 8  Pain Location: all over, LLE with touch and with mobility Pain Descriptors / Indicators:  Grimacing, Guarding, Moaning Pain Intervention(s): Limited activity within patient's tolerance, Monitored during session, Premedicated before session, Repositioned    Home Living                          Prior Function            PT Goals (current goals can now be found in the care plan section) Acute Rehab PT Goals Patient Stated Goal: none stated PT Goal Formulation: With patient Time For Goal Achievement: 04/23/24 Potential to Achieve Goals: Fair Progress towards PT goals: Progressing toward goals    Frequency    Min 2X/week      PT  Plan      Co-evaluation              AM-PAC PT 6 Clicks Mobility   Outcome Measure  Help needed turning from your back to your side while in a flat bed without using bedrails?: A Lot Help needed moving from lying on your back to sitting on the side of a flat bed without using bedrails?: A Lot Help needed moving to and from a bed to a chair (including a wheelchair)?: Total Help needed standing up from a chair using your arms (e.g., wheelchair or bedside chair)?: Total Help needed to walk in hospital room?: Total Help needed climbing 3-5 steps with a railing? : Total 6 Click Score: 8    End of Session Equipment Utilized During Treatment: Oxygen  Activity Tolerance: Patient limited by pain;Patient limited by fatigue Patient left: in bed;with call bell/phone within reach;with bed alarm set Nurse Communication: Mobility status PT Visit Diagnosis: Other abnormalities of gait and mobility (R26.89);Muscle weakness (generalized) (M62.81);Difficulty in walking, not elsewhere classified (R26.2)     Time: 8854-8795 PT Time Calculation (min) (ACUTE ONLY): 19 min  Charges:    $Therapeutic Activity: 8-22 mins PT General Charges $$ ACUTE PT VISIT: 1 Visit                     Sylvan Delon Copp PT 04/12/2024  Acute Rehabilitation Services  Office [phone]

## 2024-04-12 NOTE — TOC Initial Note (Signed)
 Transition of Care [facility]) - Initial/Assessment Note    Patient Details  Name: [name] MRN: [id number] Date of Birth: Aug 04, 1950  Transition of Care [facility]) CM/SW Contact:    Bascom Service, RN Phone Number: 04/12/2024, 2:58 PM  Clinical Narrative: Spoke to patient about d/c plans-agree to ST SNF-faxed out await bed offers,choice,then auth.                  Expected Discharge Plan: Skilled Nursing Facility Barriers to Discharge: Continued Medical Work up   Patient Goals and CMS Choice Patient states their goals for this hospitalization and ongoing recovery are:: Rehab CMS Medicare.gov Compare Post Acute Care list provided to:: Patient Choice offered to / list presented to : Patient [place] ownership interest in [facility].provided to:: Patient    Expected Discharge Plan and Services   Discharge Planning Services: CM Consult Post Acute Care Choice: Skilled Nursing Facility                                        Prior Living Arrangements/Services   Lives with:: Spouse   Do you feel safe going back to the place where you live?: Yes          Current home services: DME (chronic 02)    Activities of Daily Living   ADL Screening (condition at time of admission) Independently performs ADLs?: No Does the patient have a NEW difficulty with bathing/dressing/toileting/self-feeding that is expected to last >3 days?: Yes (Initiates electronic notice to provider for possible OT consult) Does the patient have a NEW difficulty with getting in/out of bed, walking, or climbing stairs that is expected to last >3 days?: Yes (Initiates electronic notice to provider for possible PT consult) Does the patient have a NEW difficulty with communication that is expected to last >3 days?: No Is the patient deaf or have difficulty hearing?: No Does the patient have difficulty seeing, even when wearing glasses/contacts?: No Does the patient have difficulty concentrating,  remembering, or making decisions?: No  Permission Sought/Granted Permission sought to share information with : Case Manager                Emotional Assessment              Admission diagnosis:  Acute on chronic diastolic congestive heart failure (HCC) [I50.33] Generalized weakness [R53.1] Chronic pain of left lower extremity [M79.605, G89.29] Pain of left lower extremity [M79.605] Hydrocele, unspecified hydrocele type [N43.3] Patient Active Problem List   Diagnosis Date Noted   Acute on chronic diastolic congestive heart failure (HCC) 04/09/2024   Macrocytic anemia 04/09/2024   Intractable abdominal pain 01/02/2024   Cervical radiculopathy 01/02/2024   Spinal stenosis in cervical region 01/02/2024   Gouty arthritis 01/02/2024   Morbid obesity (HCC) 01/02/2024   Renal cell carcinoma of left kidney (HCC) 01/02/2024   Intractable nausea 01/02/2024   AKI (acute kidney injury) 12/22/2022   Fall 11/14/2022   Pain of right lower extremity due to injury 11/14/2022   Diabetic ulcer of lower leg (HCC) 11/13/2022   Osteomyelitis (HCC) 11/13/2022   Dyslipidemia 10/18/2022   Cellulitis of right lower extremity 10/17/2022   Dehiscence of amputation stump of right lower extremity (HCC) 10/09/2022   Below-knee amputation of right lower extremity (HCC) 10/09/2022   Gangrene of right foot (HCC) 09/04/2022   History of below-knee amputation of right lower extremity (HCC) 09/04/2022  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) 08/23/2022   CAP (community acquired pneumonia) 12/28/2021   Constipation 12/28/2021   Pain in both lower extremities 07/03/2021   Peripheral neuropathy 07/03/2021   Acute metabolic encephalopathy 06/09/2021   Marijuana dependence (HCC) 06/09/2021   DNR (do not resuscitate) 06/09/2021   Chest pain 06/08/2021   Renal lesion 02/14/2021   Status post cryoablation 02/14/2021   Allergic rhinitis 12/08/2020   Preop pulmonary/respiratory exam 03/27/2020    Acute on chronic diastolic (congestive) heart failure (HCC) 01/22/2020   Abdominal pain 01/22/2020   Hypothyroidism 12/13/2019   DM2 (diabetes mellitus, type 2) (HCC) 12/13/2019   Chronic kidney disease, stage 3b (HCC) 12/13/2019   Acute bacterial bronchitis 12/13/2019   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   Typical atrial flutter (HCC)    Chronic respiratory failure with hypoxia (HCC) 05/23/2016   CAD (coronary artery disease) 05/23/2016   Persistent atrial fibrillation (HCC)    Atypical atrial flutter (HCC)    Visit for monitoring Tikosyn  therapy 04/16/2016   Chronic diastolic CHF (congestive heart failure) (HCC) 02/03/2016   Cough 12/06/2015   Acute on chronic respiratory failure with hypoxia (HCC) 09/08/2015   Hallucinations 09/04/2015   Lymphadenopathy 09/04/2015   Ascending aortic aneurysm 09/04/2015   Hypoxia 09/02/2015   S/P aortic valve replacement with bioprosthetic valve 09/02/2015   Pleural effusion 09/02/2015   S/P AVR 08/22/2015   Umbilical hernia 12/12/2014   GERD without esophagitis 09/20/2014   Fibromyalgia 06/14/2014   Restless legs syndrome 06/14/2014   Intrinsic asthma 10/13/2013   Dyspnea 09/15/2013   Pulmonary hypertension (HCC) 09/15/2013   Shortness of breath 05/05/2013   Pacemaker-St.Jude 04/01/2012   Bradycardia 03/31/2012   Second degree AV block 03/31/2012   AV block, 1st degree 02/01/2012   PAF (paroxysmal atrial fibrillation) (HCC) 11/13/2011   Fatigue 11/13/2011   Essential hypertension 11/13/2011   Aortic stenosis 11/13/2011   Sleep apnea 11/13/2011   PCP:  Larnell Hamilton, MD Pharmacy:   [facility] [phone] - [place], [place] - [address] AT [address] [place]  [address] Phone: [phone] Fax: [phone]     Social Drivers of Health (SDOH) Social History: SDOH Screenings   Food Insecurity: No Food Insecurity (04/09/2024)  Housing: Low Risk (04/09/2024)  Transportation Needs: No Transportation Needs  (04/09/2024)  Utilities: Not At Risk (04/09/2024)  Social Connections: Moderately Integrated (04/09/2024)  Tobacco Use: Medium Risk (04/09/2024)   SDOH Interventions:     Readmission Risk Interventions    12/23/2022   11:24 AM 01/01/2022   10:54 AM  Readmission Risk Prevention Plan  Transportation Screening Complete Complete  PCP or Specialist Appt within 3-5 Days  Complete  HRI or Home Care Consult  Complete  Social Work Consult for Recovery Care Planning/Counseling  Complete  Palliative Care Screening  Not Applicable  Medication Review Oceanographer) Complete Complete  PCP or Specialist appointment within 3-5 days of discharge Complete   HRI or Home Care Consult Complete   SW Recovery Care/Counseling Consult Complete   Palliative Care Screening Complete   Skilled Nursing Facility Not Applicable

## 2024-04-13 LAB — BASIC METABOLIC PANEL WITH GFR
Anion gap: 13 (ref 5–15)
BUN: 47 mg/dL — ABNORMAL HIGH (ref 8–23)
CO2: 29 mmol/L (ref 22–32)
Calcium: 9.5 mg/dL (ref 8.9–10.3)
Chloride: 96 mmol/L — ABNORMAL LOW (ref 98–111)
Creatinine, Ser: 1.79 mg/dL — ABNORMAL HIGH (ref 0.61–1.24)
GFR, Estimated: 39 mL/min — ABNORMAL LOW (ref >60)
Glucose, Bld: 120 mg/dL — ABNORMAL HIGH (ref 70–99)
Potassium: 4.7 mmol/L (ref 3.5–5.1)
Sodium: 139 mmol/L (ref 135–145)

## 2024-04-13 LAB — GLUCOSE, CAPILLARY
Glucose-Capillary: 124 mg/dL — ABNORMAL HIGH (ref 70–99)
Glucose-Capillary: 165 mg/dL — ABNORMAL HIGH (ref 70–99)
Glucose-Capillary: 134 mg/dL — ABNORMAL HIGH (ref 70–99)
Glucose-Capillary: 156 mg/dL — ABNORMAL HIGH (ref 70–99)

## 2024-04-13 MED ORDER — POTASSIUM CHLORIDE CRYS ER 20 MEQ PO TBCR: 20.0000 meq | EXTENDED_RELEASE_TABLET | Freq: Every day | ORAL | 11 refills | Status: AC

## 2024-04-13 MED ORDER — TORSEMIDE 40 MG PO TABS: 40.0000 mg | ORAL_TABLET | Freq: Every day | ORAL | 0 refills | Status: AC

## 2024-04-13 MED ORDER — IPRATROPIUM-ALBUTEROL 0.5-2.5 (3) MG/3ML IN SOLN
3.0000 mL | Freq: Four times a day (QID) | RESPIRATORY_TRACT | Status: DC
  Administered 2024-04-13 – 2024-04-14 (×4): 3 mL via RESPIRATORY_TRACT
  Filled 2024-04-13 (×4): qty 3

## 2024-04-13 MED ORDER — GABAPENTIN 300 MG PO CAPS: 300.0000 mg | ORAL_CAPSULE | Freq: Two times a day (BID) | ORAL | 0 refills | Status: AC

## 2024-04-13 MED ORDER — FERROUS SULFATE 325 (65 FE) MG PO TABS
325.0000 mg | ORAL_TABLET | Freq: Every day | ORAL | Status: DC
  Administered 2024-04-13 – 2024-04-14 (×2): 325 mg via ORAL
  Filled 2024-04-13 (×2): qty 1

## 2024-04-13 MED ORDER — FENTANYL CITRATE (PF) 50 MCG/ML IJ SOSY
12.5000 ug | PREFILLED_SYRINGE | Freq: Once | INTRAMUSCULAR | Status: AC
  Administered 2024-04-13: 07:00:00 12.5 ug via INTRAVENOUS
  Filled 2024-04-13: qty 1

## 2024-04-13 MED ORDER — MORPHINE SULFATE (PF) 2 MG/ML IV SOLN
1.0000 mg | INTRAVENOUS | Status: DC | PRN
  Administered 2024-04-13 – 2024-04-14 (×3): 1 mg via INTRAVENOUS
  Filled 2024-04-13 (×3): qty 1

## 2024-04-13 MED ORDER — FERROUS SULFATE 325 (65 FE) MG PO TABS: 325.0000 mg | ORAL_TABLET | Freq: Every day | ORAL | 3 refills | Status: AC

## 2024-04-13 MED ORDER — OXYCODONE HCL 5 MG PO TABS: 5.0000 mg | ORAL_TABLET | Freq: Three times a day (TID) | ORAL | 0 refills | Status: AC | PRN

## 2024-04-13 MED ORDER — IPRATROPIUM-ALBUTEROL 0.5-2.5 (3) MG/3ML IN SOLN: 3.0000 mL | Freq: Four times a day (QID) | RESPIRATORY_TRACT | 0 refills | Status: AC

## 2024-04-13 MED ORDER — OXYCODONE-ACETAMINOPHEN 5-325 MG PO TABS: 1.0000 | ORAL_TABLET | Freq: Three times a day (TID) | ORAL | 0 refills | Status: AC | PRN

## 2024-04-13 NOTE — Plan of Care (Signed)
  Problem: Education: Goal: Knowledge of General Education information will improve Description: Including pain rating scale, medication(s)/side effects and non-pharmacologic comfort measures Outcome: Progressing   Problem: Health Behavior/Discharge Planning: Goal: Ability to manage health-related needs will improve Outcome: Progressing   Problem: Clinical Measurements: Goal: Ability to maintain clinical measurements within normal limits will improve Outcome: Progressing Goal: Will remain free from infection Outcome: Progressing Goal: Diagnostic test results will improve Outcome: Progressing Goal: Respiratory complications will improve Outcome: Progressing Goal: Cardiovascular complication will be avoided Outcome: Progressing   Problem: Activity: Goal: Risk for activity intolerance will decrease Outcome: Progressing   Problem: Nutrition: Goal: Adequate nutrition will be maintained Outcome: Progressing   Problem: Coping: Goal: Level of anxiety will decrease Outcome: Progressing   Problem: Elimination: Goal: Will not experience complications related to bowel motility Outcome: Progressing Goal: Will not experience complications related to urinary retention Outcome: Progressing   Problem: Pain Managment: Goal: General experience of comfort will improve and/or be controlled Outcome: Progressing   Problem: Safety: Goal: Ability to remain free from injury will improve Outcome: Progressing   Problem: Skin Integrity: Goal: Risk for impaired skin integrity will decrease Outcome: Progressing   Problem: Education: Goal: Ability to demonstrate management of disease process will improve Outcome: Progressing Goal: Ability to verbalize understanding of medication therapies will improve Outcome: Progressing Goal: Individualized Educational Video(s) Outcome: Progressing   Problem: Activity: Goal: Capacity to carry out activities will improve Outcome: Progressing    Problem: Cardiac: Goal: Ability to achieve and maintain adequate cardiopulmonary perfusion will improve Outcome: Progressing   Problem: Education: Goal: Ability to describe self-care measures that may prevent or decrease complications (Diabetes Survival Skills Education) will improve Outcome: Progressing Goal: Individualized Educational Video(s) Outcome: Progressing   Problem: Coping: Goal: Ability to adjust to condition or change in health will improve Outcome: Progressing   Problem: Fluid Volume: Goal: Ability to maintain a balanced intake and output will improve Outcome: Progressing   Problem: Health Behavior/Discharge Planning: Goal: Ability to identify and utilize available resources and services will improve Outcome: Progressing Goal: Ability to manage health-related needs will improve Outcome: Progressing   Problem: Metabolic: Goal: Ability to maintain appropriate glucose levels will improve Outcome: Progressing   Problem: Nutritional: Goal: Maintenance of adequate nutrition will improve Outcome: Progressing Goal: Progress toward achieving an optimal weight will improve Outcome: Progressing   Problem: Skin Integrity: Goal: Risk for impaired skin integrity will decrease Outcome: Progressing   Problem: Tissue Perfusion: Goal: Adequacy of tissue perfusion will improve Outcome: Progressing

## 2024-04-13 NOTE — TOC Progression Note (Addendum)
 Transition of Care [facility]) - Progression Note    Patient Details  Name: [name] MRN: [id number] Date of Birth: Feb 25, 1951  Transition of Care [facility]) CM/SW Contact  [name], [name], RN Phone Number: 04/13/2024, 12:46 PM  Clinical Narrative:   await choice, then auth.         Service Provider Request Status Stars Address Phone   [facility] Preferred SNF  Accepted 5 [address] [place] [address] [phone]   [facility], [facility] Preferred SNF  Accepted 4 [address], [place] [address] [phone]   [facility] SNF  Accepted 2 [address] [place] [address] [phone]   [facility]  Accepted 1 [address] [place] [address] [phone]   [facility] SNF  Accepted 3 [address] [place] [address] [phone]   [facility] SN[facility]  Accepted 2 [address] [place] [address] [phone]   [facility] SNF  Accepted 1 [address] [place] [address] [phone]     -2:42p-spouse chose [facility] will have bed avail tomorrow. Await shara lan shara pi#[phone].  Expected Discharge Plan: Skilled Nursing Facility Barriers to Discharge: Other (must enter comment) (await bed choice,then auth)               Expected Discharge Plan and Services   Discharge Planning Services: CM Consult Post Acute Care Choice: Skilled Nursing Facility   Expected Discharge Date: 04/13/24                                     Social Drivers of Health (SDOH) Interventions SDOH Screenings   Food Insecurity: No Food Insecurity (04/09/2024)  Housing: Low Risk (04/09/2024)  Transportation Needs: No Transportation Needs (04/09/2024)  Utilities: Not At Risk (04/09/2024)  Social Connections: Moderately Integrated (04/09/2024)  Tobacco Use: Medium Risk (04/09/2024)    Readmission Risk Interventions     12/23/2022   11:24 AM 01/01/2022   10:54 AM  Readmission Risk Prevention Plan  Transportation Screening Complete Complete  PCP or Specialist Appt within 3-5 Days  Complete  HRI or Home Care Consult  Complete  Social Work Consult for Recovery Care Planning/Counseling  Complete  Palliative Care Screening  Not Applicable  Medication Review Oceanographer) Complete Complete  PCP or Specialist appointment within 3-5 days of discharge Complete   HRI or Home Care Consult Complete   SW Recovery Care/Counseling Consult Complete   Palliative Care Screening Complete   Skilled Nursing Facility Not Applicable

## 2024-04-13 NOTE — Discharge Summary (Signed)
 Physician Discharge Summary   Patient: [name] MRN: [id number] DOB: [birth year]  Admit date:     04/08/2024  Discharge date: 04/13/2024  Discharge Physician: Owen DELENA Lore   PCP: Larnell Hamilton, MD   Recommendations at discharge:   Needs Bmet to monitor renal function. Torsemide  change to 40 mg daily.    Discharge Diagnoses: Principal Problem:   Acute on chronic diastolic congestive heart failure (HCC) Active Problems:   Hypothyroidism   DM2 (diabetes mellitus, type 2) (HCC)   Chronic kidney disease, stage 3b (HCC)   PAF (paroxysmal atrial fibrillation) (HCC)   Essential hypertension   Sleep apnea   GERD without esophagitis   CAD (coronary artery disease)   COPD mixed type (HCC)   Gouty arthritis   Macrocytic anemia  Resolved Problems:   * No resolved hospital problems. *  Hospital Course: 73 year old with past medical history significant for mild aortic stenosis, osteoarthritis of the hand, asthma, A-fib, asthma, COPD, DDD, fatty liver, fibromyalgia, GERD, hyperlipidemia, hypothyroidism, history of intractable abdominal pain, cannabis dependence, previous morbid obesity, restless leg syndrome, cervical spinal stenosis, hypertension, first-degree AV block, second-degree AV block, history of pacemaker, chronic diastolic heart failure who was seen for chest pain in the ED subsequently hospitalist consulted for worsening dyspnea over the last few weeks with orthopnea, intermittent episode of precordial sharp chest pain in nature nonradiating.  Admitted for heart failure exacerbation.    Assessment and Plan: 1-Acute on Chronic Systolic Diastolic heart failure exacerbation: -Patient developed worsening shortness of breath, orthopnea precordial chest pain Echo showed left ventricular ejection fraction 45 to 50%, ejection fraction appears to have decreased compared to echo from February - Currently holding bisoprolol  due to acute heart failure exacerbation - Previous  weight 220 pounds.  Monitor daily weight; negative 4 L -Treated with IV lasix . Transition to Torsemide  starting tomorrow.  5.2 L weight fluctuates, likely not accurate. Down to 190   he will be discharge on torsemide  40 mg daily. Needs Bmet to monitor renal function.   Chronic abdominal pain: Patient has a history of chronic abdominal pain.  CT abdomen and pelvis not show any acute finding No significant bladder or urinary retention   Chronic kidney disease stage IIIb Baseline around 1.8 Monitor on diuretic Present with a creatinine of 2.0, stable at 1.9 Cr stable at 1.7  Diabetes type 2: Continue sliding scale insulin    Paroxysmal A-fib/essential hypertension/CAD Continue apixaban    Hypothyroidism - Continue levothyroxine    Mild thrombocytopenia - Monitor   History of COPD On chronic 2 L of oxygen  Continue with inhaler.   scheduled DuoNeb   Gout arthritis Continue allopurinol    Microcytic anemia Iron low, TSAT 7.  Folate more than 20, B12 975.  Discharge on oral iron          Estimated body mass index is 26.32 kg/m as calculated from the following:   Height as of this encounter: 5' 11 (1.803 m).   Weight as of this encounter: 85.6 kg.          Consultants: None Procedures performed: none Disposition: Skilled nursing facility Diet recommendation:  Cardiac diet DISCHARGE MEDICATION: Allergies as of 04/13/2024       Reactions   Cephalexin  Rash, Other (See Comments)   Broke out all over   Meloxicam Rash, Other (See Comments)   Red Man Syndrome, also   Vancomycin  Other (See Comments)   Red Man's syndrome 09/02/15, resolved with diphenhydramine  and slowing of rate  Medication List     STOP taking these medications    bisoprolol  5 MG tablet Commonly known as: ZEBETA    ipratropium 0.03 % nasal spray Commonly known as: ATROVENT    oxyCODONE -acetaminophen  10-325 MG tablet Commonly known as: PERCOCET Replaced by: oxyCODONE -acetaminophen   5-325 MG tablet       TAKE these medications    rOPINIRole  1 MG tablet Commonly known as: REQUIP  Take 1 tablet (1 mg total) by mouth in the morning and at bedtime. What changed: when to take this The timing of this medication is very important.   acetaminophen  325 MG tablet Commonly known as: TYLENOL  Take 2 tablets (650 mg total) by mouth every 6 (six) hours as needed. What changed: reasons to take this   albuterol  108 (90 Base) MCG/ACT inhaler Commonly known as: VENTOLIN  HFA Inhale 2 puffs into the lungs every 6 (six) hours as needed for wheezing or shortness of breath.   allopurinol  300 MG tablet Commonly known as: ZYLOPRIM  Take 300 mg by mouth daily.   apixaban  5 MG Tabs tablet Commonly known as: Eliquis  Take 1 tablet (5 mg total) by mouth 2 (two) times daily.   Breztri  Aerosphere 160-9-4.8 MCG/ACT Aero inhaler Generic drug: budesonide -glycopyrrolate -formoterol  Inhale 2 puffs into the lungs in the morning and at bedtime.   Cal-Mag-Zinc -D Tabs Take 1 tablet by mouth at bedtime.   colchicine  0.6 MG tablet Take 0.6 mg by mouth daily as needed (for gout flares- as directed).   cyclobenzaprine  5 MG tablet Commonly known as: FLEXERIL  Take 2.5-5 mg by mouth 2 (two) times daily as needed for muscle spasms.   empagliflozin  10 MG Tabs tablet Commonly known as: Jardiance  Take 1 tablet (10 mg total) by mouth daily. What changed: when to take this   gabapentin  300 MG capsule Commonly known as: NEURONTIN  Take 1 capsule (300 mg total) by mouth 2 (two) times daily. What changed:  how much to take when to take this additional instructions   ipratropium-albuterol  0.5-2.5 (3) MG/3ML Soln Commonly known as: DUONEB Take 3 mLs by nebulization every 6 (six) hours. What changed:  when to take this reasons to take this   levothyroxine  175 MCG tablet Commonly known as: SYNTHROID  Take 175 mcg by mouth daily before breakfast.   montelukast  10 MG tablet Commonly known as:  SINGULAIR  TAKE 1 TABLET(10 MG) BY MOUTH AT BEDTIME What changed: See the new instructions.   multivitamin with minerals Tabs tablet Take 1 tablet by mouth daily. What changed: when to take this   ondansetron  4 MG tablet Commonly known as: ZOFRAN  Take 1 tablet (4 mg total) by mouth every 6 (six) hours. What changed:  when to take this reasons to take this   oxyCODONE  5 MG immediate release tablet Commonly known as: Oxy IR/ROXICODONE  Take 1 tablet (5 mg total) by mouth 3 (three) times daily as needed for moderate pain (pain score 4-6) or severe pain (pain score 7-10).   oxyCODONE -acetaminophen  5-325 MG tablet Commonly known as: PERCOCET/ROXICET Take 1 tablet by mouth 3 (three) times daily as needed for moderate pain (pain score 4-6) or severe pain (pain score 7-10). Replaces: oxyCODONE -acetaminophen  10-325 MG tablet   OXYGEN  Inhale 2 L/min into the lungs See admin instructions. Inhale 2 L/min into the lungs at bedtime and during the day as needed for shortness of breath   pantoprazole  40 MG tablet Commonly known as: PROTONIX  Take 40 mg by mouth daily before breakfast. What changed: when to take this   polyethylene glycol powder 17 GM/SCOOP powder  Commonly known as: GLYCOLAX /MIRALAX  Take 17 g by mouth 2 (two) times daily. Dissolve 1 capful (17g) in 4-8 ounces of liquid and take by mouth twice daily. What changed:  when to take this reasons to take this additional instructions   potassium chloride  SA 20 MEQ tablet Commonly known as: KLOR-CON  M Take 1 tablet (20 mEq total) by mouth daily. What changed: when to take this   predniSONE  2.5 MG tablet Commonly known as: DELTASONE  Take 1 tablet (2.5 mg total) by mouth daily with breakfast.   roflumilast  500 MCG Tabs tablet Commonly known as: DALIRESP  Take 1 tablet (500 mcg total) by mouth daily.   rosuvastatin  10 MG tablet Commonly known as: CRESTOR  Take 10 mg by mouth at bedtime.   spironolactone  25 MG tablet Commonly  known as: ALDACTONE  Take 1 tablet (25 mg total) by mouth daily. What changed: how much to take   Systane Ultra PF 0.4-0.3 % Soln Generic drug: Polyethyl Glyc-Propyl Glyc PF Place 1-2 drops into both eyes 3 (three) times daily as needed (for dryness).   TIGER BALM ARTHRITIS RUB EX Apply 1 application  topically as needed (for pain).   Torsemide  40 MG Tabs Take 40 mg by mouth daily. What changed:  medication strength how much to take how to take this when to take this additional instructions   traZODone  50 MG tablet Commonly known as: DESYREL  Take 50 mg by mouth at bedtime.   Vitamin D3 50 MCG (2000 UT) Tabs Take 4,000 Units by mouth daily.        Discharge Exam: Filed Weights   04/11/24 0437 04/12/24 0721 04/13/24 0500  Weight: 85.1 kg 85.6 kg 86.4 kg   General; No acute distress  Condition at discharge: stable  The results of significant diagnostics from this hospitalization (including imaging, microbiology, ancillary and laboratory) are listed below for reference.   Imaging Studies: ECHOCARDIOGRAM COMPLETE Result Date: 04/09/2024    ECHOCARDIOGRAM REPORT   Patient Name:   TAMMIE ELLSWORTH Date of Exam: 04/09/2024 Medical Rec #:  [id number]       Height:       71.0 in Accession #:    [id number]      Weight:       200.0 lb Date of Birth:  [birth year]       BSA:          2.109 m Patient Age:    73 years        BP:           137/69 mmHg Patient Gender: M               HR:           64 bpm. Exam Location:  Inpatient Procedure: 2D Echo, Color Doppler and Cardiac Doppler (Both Spectral and Color            Flow Doppler were utilized during procedure). Indications:    CHF- Acute Diastolic I50.31  History:        Patient has prior history of Echocardiogram examinations, most                 recent 06/12/2023. CHF, CAD, Pacemaker, Pulmonary HTN and COPD,                 Aortic Valve Disease and s/p AVR, there is a bioprosthetic valve                 present in the AO position.  Procedure date: 2017;  Risk                 Factors:Dyslipidemia, Hypertension and Diabetes.  Sonographer:    Koleen Popper RDCS Referring Phys: [id number] DAVID MANUEL ORTIZ IMPRESSIONS  1. Left ventricular ejection fraction, by estimation, is 45 to 50%. The left ventricle has mildly decreased function. The left ventricle demonstrates global hypokinesis with septal-lateral dyssynchrony consistent with RV pacing. Left ventricular diastolic parameters are indeterminate.  2. D-shaped interventricular septum suggestive of RV pressure/volume overload. Right ventricular systolic function is moderately reduced. The right ventricular size is moderately enlarged. There is moderately elevated pulmonary artery systolic pressure.  The estimated right ventricular systolic pressure is 58.0 mmHg.  3. Left atrial size was mildly dilated.  4. Right atrial size was moderately dilated.  5. The mitral valve is normal in structure. Mild mitral valve regurgitation. No evidence of mitral stenosis.  6. Bioprosthetic aortic valve. Trivial perivalvular leakage. Visually the valve opens well, mean gradient 7 mmHg. EOA 2.25 cm^2.  7. The inferior vena cava is dilated in size with <50% respiratory variability, suggesting right atrial pressure of 15 mmHg.  8. Technically difficult study with poor acoustic windows. FINDINGS  Left Ventricle: Left ventricular ejection fraction, by estimation, is 45 to 50%. The left ventricle has mildly decreased function. The left ventricle demonstrates global hypokinesis. The left ventricular internal cavity size was normal in size. There is  no left ventricular hypertrophy. Left ventricular diastolic parameters are indeterminate. Right Ventricle: D-shaped interventricular septum suggestive of RV pressure/volume overload. The right ventricular size is moderately enlarged. No increase in right ventricular wall thickness. Right ventricular systolic function is moderately reduced. There is moderately elevated pulmonary  artery systolic pressure. The tricuspid regurgitant velocity is 3.28 m/s, and with an assumed right atrial pressure of 15 mmHg, the estimated right ventricular systolic pressure is 58.0 mmHg. Left Atrium: Left atrial size was mildly dilated. Right Atrium: Right atrial size was moderately dilated. Pericardium: There is no evidence of pericardial effusion. Mitral Valve: The mitral valve is normal in structure. Mild mitral annular calcification. Mild mitral valve regurgitation. No evidence of mitral valve stenosis. Tricuspid Valve: The tricuspid valve is normal in structure. Tricuspid valve regurgitation is trivial. Aortic Valve: Bioprosthetic aortic valve. Trivial perivalvular leakage. Visually the valve opens well, mean gradient 7 mmHg. EOA 2.25 cm^2. Aortic valve mean gradient measures 7.0 mmHg. Aortic valve peak gradient measures 13.5 mmHg. Aortic valve area, by  VTI measures 2.25 cm. Pulmonic Valve: The pulmonic valve was not well visualized. Pulmonic valve regurgitation is not visualized. Aorta: The aortic root is normal in size and structure. Venous: The inferior vena cava is dilated in size with less than 50% respiratory variability, suggesting right atrial pressure of 15 mmHg. IAS/Shunts: No atrial level shunt detected by color flow Doppler. Additional Comments: A device lead is visualized in the right ventricle.  LEFT VENTRICLE PLAX 2D LVIDd:         5.00 cm LVIDs:         3.40 cm LV PW:         1.10 cm LV IVS:        0.90 cm LVOT diam:     1.90 cm LV SV:         80 LV SV Index:   38 LVOT Area:     2.84 cm  LV Volumes (MOD) LV vol d, MOD A2C: 168.0 ml LV vol d, MOD A4C: 114.0 ml LV vol s, MOD A2C: 76.0 ml LV vol s, MOD  A4C: 55.7 ml LV SV MOD A2C:     92.0 ml LV SV MOD A4C:     114.0 ml LV SV MOD BP:      80.1 ml RIGHT VENTRICLE RV Basal diam:  5.00 cm RV Mid diam:    4.10 cm RV S prime:     5.03 cm/s TAPSE (M-mode): 0.8 cm LEFT ATRIUM             Index        RIGHT ATRIUM           Index LA diam:         4.70 cm 2.23 cm/m   RA Area:     24.90 cm LA Vol (A2C):   59.0 ml 27.98 ml/m  RA Volume:   85.40 ml  40.50 ml/m LA Vol (A4C):   59.2 ml 28.08 ml/m LA Biplane Vol: 61.6 ml 29.21 ml/m  AORTIC VALVE AV Area (Vmax):    2.43 cm AV Area (Vmean):   2.35 cm AV Area (VTI):     2.25 cm AV Vmax:           184.00 cm/s AV Vmean:          119.000 cm/s AV VTI:            0.356 m AV Peak Grad:      13.5 mmHg AV Mean Grad:      7.0 mmHg LVOT Vmax:         158.00 cm/s LVOT Vmean:        98.800 cm/s LVOT VTI:          0.283 m LVOT/AV VTI ratio: 0.79  AORTA Ao Root diam: 2.90 cm MITRAL VALVE                TRICUSPID VALVE MV Area (PHT): 4.80 cm     TR Peak grad:   43.0 mmHg MV Decel Time: 158 msec     TR Vmax:        328.00 cm/s MR Peak grad: 53.0 mmHg MR Vmax:      364.00 cm/s   SHUNTS MV E velocity: 107.00 cm/s  Systemic VTI:  0.28 m MV A velocity: 32.50 cm/s   Systemic Diam: 1.90 cm MV E/A ratio:  3.29 Dalton McleanMD Electronically signed by Ezra Kanner Signature Date/Time: 04/09/2024/4:05:33 PM    Final    VAS US  LOWER EXTREMITY VENOUS (DVT) (7a-7p) Result Date: 04/08/2024  Lower Venous DVT Study Patient Name:  TIMMY BUBECK  Date of Exam:   04/08/2024 Medical Rec #: [id number]        Accession #:    [id number] Date of Birth: 05/10/1950        Patient Gender: M Patient Age:   41 years Exam Location:  [facility] Procedure:      VAS US  LOWER EXTREMITY VENOUS (DVT) Referring Phys: JAYSON PEREYRA --------------------------------------------------------------------------------  Indications: Pain.  Risk Factors: None identified. Limitations: Poor ultrasound/tissue interface. Comparison Study: No prior studies. Performing Technologist: Cordella Collet RVT  Examination Guidelines: A complete evaluation includes B-mode imaging, spectral Doppler, color Doppler, and power Doppler as needed of all accessible portions of each vessel. Bilateral testing is considered an integral part of a complete examination. Limited  examinations for reoccurring indications may be performed as noted. The reflux portion of the exam is performed with the patient in reverse Trendelenburg.  +-----+---------------+---------+-----------+----------+--------------+ RIGHTCompressibilityPhasicitySpontaneityPropertiesThrombus Aging +-----+---------------+---------+-----------+----------+--------------+ CFV  Full           Yes  No                                  +-----+---------------+---------+-----------+----------+--------------+   +---------+---------------+---------+-----------+----------+--------------+ LEFT     CompressibilityPhasicitySpontaneityPropertiesThrombus Aging +---------+---------------+---------+-----------+----------+--------------+ CFV      Full           Yes      No                                  +---------+---------------+---------+-----------+----------+--------------+ SFJ      Full                                                        +---------+---------------+---------+-----------+----------+--------------+ FV Prox  Full                                                        +---------+---------------+---------+-----------+----------+--------------+ FV Mid   Full                                                        +---------+---------------+---------+-----------+----------+--------------+ FV DistalFull                                                        +---------+---------------+---------+-----------+----------+--------------+ PFV      Full                                                        +---------+---------------+---------+-----------+----------+--------------+ POP      Full           Yes      No                                  +---------+---------------+---------+-----------+----------+--------------+ PTV      Full                                                         +---------+---------------+---------+-----------+----------+--------------+ PERO     Full                                                        +---------+---------------+---------+-----------+----------+--------------+    Summary: RIGHT: - No  evidence of common femoral vein obstruction.   LEFT: - There is no evidence of deep vein thrombosis in the lower extremity.  - No cystic structure found in the popliteal fossa.  *See table(s) above for measurements and observations. Electronically signed by Debby Robertson on 04/08/2024 at 10:25:17 PM.    Final    CT Angio Chest PE W and/or Wo Contrast Result Date: 04/08/2024 CLINICAL DATA:  Concern for pulmonary embolism. Abdominal pain. History of renal cell carcinoma status post prior ablation of the left kidney. EXAM: CT ANGIOGRAPHY CHEST CT ABDOMEN AND PELVIS WITH CONTRAST TECHNIQUE: Multidetector CT imaging of the chest was performed using the standard protocol during bolus administration of intravenous contrast. Multiplanar CT image reconstructions and MIPs were obtained to evaluate the vascular anatomy. Multidetector CT imaging of the abdomen and pelvis was performed using the standard protocol during bolus administration of intravenous contrast. RADIATION DOSE REDUCTION: This exam was performed according to the departmental dose-optimization program which includes automated exposure control, adjustment of the mA and/or kV according to patient size and/or use of iterative reconstruction technique. CONTRAST:  75mL OMNIPAQUE  IOHEXOL  350 MG/ML SOLN COMPARISON:  Chest radiograph dated 04/08/2024. CT abdomen pelvis dated 01/02/2024. FINDINGS: CTA CHEST FINDINGS Cardiovascular: There is no cardiomegaly or pericardial effusion. Retrograde flow of contrast from the right atrium into the IVC 2 suggestive of right heart dysfunction. There is coronary vascular calcification. Mechanical aortic valve. There is mild atherosclerotic calcification of the thoracic aorta. No  pulmonary artery embolus identified. Mediastinum/Nodes: No hilar or mediastinal adenopathy. The esophagus is grossly unremarkable. No mediastinal fluid collection. Lungs/Pleura: No focal consolidation, pleural effusion, pneumothorax. Small scattered calcified granuloma. The central airways are patent. Musculoskeletal: Median sternotomy wires. Left pectoral pacemaker device. No acute osseous pathology. Osteopenia with degenerative changes of the spine. Review of the MIP images confirms the above findings. CT ABDOMEN and PELVIS FINDINGS No intra-abdominal free air or free fluid. Hepatobiliary: Slight irregularity of the liver contour suspicious for early changes of cirrhosis. No biliary dilatation. Small gallstone. No pericholecystic fluid or evidence of acute cholecystitis by CT. Pancreas: Unremarkable. No pancreatic ductal dilatation or surrounding inflammatory changes. Spleen: Normal in size without focal abnormality. Adrenals/Urinary Tract: The adrenal glands unremarkable. Mild left and severe right renal atrophy. Similar appearance of a 1.5 cm post ablation treatment of the left kidney. There is no hydronephrosis on either side. The visualized ureters and urinary bladder appear unremarkable. Stomach/Bowel: There is no bowel obstruction or active inflammation. The appendix is normal. Vascular/Lymphatic: Advanced aortoiliac atherosclerotic disease. The IVC is unremarkable. No portal venous gas. There is no adenopathy. Reproductive: The prostate and seminal vesicles are grossly normal. Other: Small fat containing bilateral inguinal hernias. Small fat containing umbilical hernia. Musculoskeletal: Osteopenia.  No acute osseous pathology. Review of the MIP images confirms the above findings. IMPRESSION: 1. No acute intrathoracic, abdominal, or pelvic pathology. No CT evidence of pulmonary artery embolus. 2. Cholelithiasis. 3. Similar appearance of a 1.5 cm post ablation treatment of the left kidney. 4.  Aortic  Atherosclerosis (ICD10-I70.0). Electronically Signed   By: Vanetta Chou M.D.   On: 04/08/2024 16:22   CT ABDOMEN PELVIS W CONTRAST Result Date: 04/08/2024 CLINICAL DATA:  Concern for pulmonary embolism. Abdominal pain. History of renal cell carcinoma status post prior ablation of the left kidney. EXAM: CT ANGIOGRAPHY CHEST CT ABDOMEN AND PELVIS WITH CONTRAST TECHNIQUE: Multidetector CT imaging of the chest was performed using the standard protocol during bolus administration of intravenous contrast. Multiplanar CT image reconstructions and MIPs  were obtained to evaluate the vascular anatomy. Multidetector CT imaging of the abdomen and pelvis was performed using the standard protocol during bolus administration of intravenous contrast. RADIATION DOSE REDUCTION: This exam was performed according to the departmental dose-optimization program which includes automated exposure control, adjustment of the mA and/or kV according to patient size and/or use of iterative reconstruction technique. CONTRAST:  75mL OMNIPAQUE  IOHEXOL  350 MG/ML SOLN COMPARISON:  Chest radiograph dated 04/08/2024. CT abdomen pelvis dated 01/02/2024. FINDINGS: CTA CHEST FINDINGS Cardiovascular: There is no cardiomegaly or pericardial effusion. Retrograde flow of contrast from the right atrium into the IVC 2 suggestive of right heart dysfunction. There is coronary vascular calcification. Mechanical aortic valve. There is mild atherosclerotic calcification of the thoracic aorta. No pulmonary artery embolus identified. Mediastinum/Nodes: No hilar or mediastinal adenopathy. The esophagus is grossly unremarkable. No mediastinal fluid collection. Lungs/Pleura: No focal consolidation, pleural effusion, pneumothorax. Small scattered calcified granuloma. The central airways are patent. Musculoskeletal: Median sternotomy wires. Left pectoral pacemaker device. No acute osseous pathology. Osteopenia with degenerative changes of the spine. Review of the  MIP images confirms the above findings. CT ABDOMEN and PELVIS FINDINGS No intra-abdominal free air or free fluid. Hepatobiliary: Slight irregularity of the liver contour suspicious for early changes of cirrhosis. No biliary dilatation. Small gallstone. No pericholecystic fluid or evidence of acute cholecystitis by CT. Pancreas: Unremarkable. No pancreatic ductal dilatation or surrounding inflammatory changes. Spleen: Normal in size without focal abnormality. Adrenals/Urinary Tract: The adrenal glands unremarkable. Mild left and severe right renal atrophy. Similar appearance of a 1.5 cm post ablation treatment of the left kidney. There is no hydronephrosis on either side. The visualized ureters and urinary bladder appear unremarkable. Stomach/Bowel: There is no bowel obstruction or active inflammation. The appendix is normal. Vascular/Lymphatic: Advanced aortoiliac atherosclerotic disease. The IVC is unremarkable. No portal venous gas. There is no adenopathy. Reproductive: The prostate and seminal vesicles are grossly normal. Other: Small fat containing bilateral inguinal hernias. Small fat containing umbilical hernia. Musculoskeletal: Osteopenia.  No acute osseous pathology. Review of the MIP images confirms the above findings. IMPRESSION: 1. No acute intrathoracic, abdominal, or pelvic pathology. No CT evidence of pulmonary artery embolus. 2. Cholelithiasis. 3. Similar appearance of a 1.5 cm post ablation treatment of the left kidney. 4.  Aortic Atherosclerosis (ICD10-I70.0). Electronically Signed   By: Vanetta Chou M.D.   On: 04/08/2024 16:22   US  SCROTUM W/DOPPLER Result Date: 04/08/2024 EXAM: ULTRASOUND SCROTUM/TESTICLES WITH DOPPLER FLOW EVALUATION 04/08/2024 03:29:00 PM TECHNIQUE: Duplex ultrasound using B-mode/gray scaled imaging, Doppler spectral analysis and color flow Doppler was obtained of the testicles. COMPARISON: 09/17/2017 CLINICAL HISTORY: left testicle pain/swelling FINDINGS: RIGHT: GREY  SCALE: The right testicle measures 3.8 x 3.0 x 2.7 cm. It demonstrates normal homogeneous echotexture without focal lesion. No testicular microlithiasis. DOPPLER EVALUATION: There is normal arterial and venous Doppler flow within the testicle. VARICOCELE: No scrotal varicocele. SCROTAL SAC: Small hydrocele. EPIDIDYMIS: No acute abnormality. LEFT: GREY SCALE: The left testicle measures 4.0 x 3.1 x 1.9 cm. It demonstrates normal homogeneous echotexture without focal lesion. No testicular microlithiasis. DOPPLER EVALUATION: There is normal arterial and venous Doppler flow within the testicle. VARICOCELE: No scrotal varicocele. SCROTAL SAC: Small hydrocele. EPIDIDYMIS: Left epididymal cyst. No acute abnormality. IMPRESSION: 1. Small bilateral hydroceles. 2. Left epididymal head cyst measuring up to 1.9 cm. Electronically signed by: Lynwood Seip MD 04/08/2024 03:58 PM EST RP Workstation: [id number]   DG Chest 2 View Result Date: 04/08/2024 CLINICAL DATA:  Chest pain. EXAM: CHEST - 2  VIEW COMPARISON:  Chest radiograph dated 03/16/2024. FINDINGS: No focal consolidation, pleural effusion or pneumothorax. Enlargement of the bilateral hila, suggestive of pulmonary hypertension. Clinical correlation recommended. Stable mild cardiomegaly. 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 Left pectoral pacemaker device. No acute osseous pathology. IMPRESSION: 1. No acute cardiopulmonary process. 2. Mild cardiomegaly. Electronically Signed   By: Vanetta Chou M.D.   On: 04/08/2024 14:27   VAS US  LOWER EXTREMITY VENOUS (DVT) (7a-7p) Result Date: 03/16/2024  Lower Venous DVT Study Patient Name:  CHAROD SLAWINSKI  Date of Exam:   03/16/2024 Medical Rec #: [id number]        Accession #:    [id number] Date of Birth: [birth year]        Patient Gender: M Patient Age:   10 years Exam Location:  [facility] Procedure:      VAS US  LOWER EXTREMITY VENOUS (DVT) Referring Phys: [facility]ED[facility]  --------------------------------------------------------------------------------  Indications: Pain, and Erythema. Other Indications: A-fib on Eliquis , SOB due to COPD. Comparison Study: No prior exam. Performing Technologist: Edilia Elden Appl  Examination Guidelines: A complete evaluation includes B-mode imaging, spectral Doppler, color Doppler, and power Doppler as needed of all accessible portions of each vessel. Bilateral testing is considered an integral part of a complete examination. Limited examinations for reoccurring indications may be performed as noted. The reflux portion of the exam is performed with the patient in reverse Trendelenburg.  +-----+---------------+---------+-----------+----------+--------------+ RIGHTCompressibilityPhasicitySpontaneityPropertiesThrombus Aging +-----+---------------+---------+-----------+----------+--------------+ CFV  Full           Yes      Yes                                 +-----+---------------+---------+-----------+----------+--------------+ SFJ  Full           Yes      Yes                                 +-----+---------------+---------+-----------+----------+--------------+   +---------+---------------+---------+-----------+----------+--------------+ LEFT     CompressibilityPhasicitySpontaneityPropertiesThrombus Aging +---------+---------------+---------+-----------+----------+--------------+ CFV      Full           Yes      Yes                                 +---------+---------------+---------+-----------+----------+--------------+ SFJ      Full           Yes      Yes                                 +---------+---------------+---------+-----------+----------+--------------+ FV Prox  Full                                                        +---------+---------------+---------+-----------+----------+--------------+ FV Mid   Full                                                         +---------+---------------+---------+-----------+----------+--------------+  FV DistalFull                                                        +---------+---------------+---------+-----------+----------+--------------+ PFV      Full                                                        +---------+---------------+---------+-----------+----------+--------------+ POP      Full           Yes      Yes                                 +---------+---------------+---------+-----------+----------+--------------+ PTV      Full                                                        +---------+---------------+---------+-----------+----------+--------------+ PERO     Full                                                        +---------+---------------+---------+-----------+----------+--------------+ Complex structure noted in the left popliteal fossa measuring 3.6 x 1 x 2.2 cm.   Summary: RIGHT: - No evidence of common femoral vein obstruction.   LEFT: - There is no evidence of deep vein thrombosis in the lower extremity.  - A cystic structure is found in the popliteal fossa.  *See table(s) above for measurements and observations. Electronically signed by Debby Robertson on 03/16/2024 at 12:45:57 PM.    Final    DG Hip Unilat W or Wo Pelvis 2-3 Views Left Result Date: 03/16/2024 EXAM: 2 OR MORE VIEW(S) XRAY OF THE HIP 03/16/2024 04:46:00 AM COMPARISON: None available. CLINICAL HISTORY: hip pain FINDINGS: BONES AND JOINTS: No acute fracture or focal osseous lesion. The hip joint is maintained. No significant degenerative changes. SOFT TISSUES: The soft tissues are unremarkable. IMPRESSION: 1. No acute osseous abnormality Electronically signed by: Waddell Calk MD 03/16/2024 06:08 AM EST RP Workstation: [id number]   DG Chest 2 View Result Date: 03/16/2024 EXAM: 2 VIEW(S) XRAY OF THE CHEST 03/16/2024 04:46:00 AM COMPARISON: 06/14/2023 CLINICAL HISTORY: SOB FINDINGS: LINES, TUBES AND  DEVICES: Left cardiac pacing device. LUNGS AND PLEURA: Increased pulmonary vascular congestion. No focal pulmonary opacity. No pleural effusion or frank edema. No pneumothorax. HEART AND MEDIASTINUM: Unchanged cardiomegaly. Left atrial appendage clip. Aortic atherosclerotic calcifications. Status post aortic valve repair. BONES AND SOFT TISSUES: Status post median sternotomy. Mild thoracic spine degenerative changes. No acute osseous abnormality. IMPRESSION: 1. Increased pulmonary vascular congestion without pleural effusion or frank edema. Electronically signed by: Waddell Calk MD 03/16/2024 06:07 AM EST RP Workstation: [id number]    Microbiology: Results for orders placed or performed during the hospital encounter of 04/08/24  Resp panel by RT-PCR (RSV, Flu A&B, Covid) Anterior Nasal Swab  Status: None   Collection Time: 04/08/24  1:00 PM   Specimen: Anterior Nasal Swab  Result Value Ref Range Status   SARS Coronavirus 2 by RT PCR NEGATIVE NEGATIVE Final    Comment: (NOTE) SARS-CoV-2 target nucleic acids are NOT DETECTED.  The SARS-CoV-2 RNA is generally detectable in upper respiratory specimens during the acute phase of infection. The lowest concentration of SARS-CoV-2 viral copies this assay can detect is 138 copies/mL. A negative result does not preclude SARS-Cov-2 infection and should not be used as the sole basis for treatment or other patient management decisions. A negative result may occur with  improper specimen collection/handling, submission of specimen other than nasopharyngeal swab, presence of viral mutation(s) within the areas targeted by this assay, and inadequate number of viral copies(<138 copies/mL). A negative result must be combined with clinical observations, patient history, and epidemiological information. The expected result is Negative.  Fact Sheet for Patients:  bloggercourse.com  Fact Sheet for Healthcare Providers:   seriousbroker.it  This test is no t yet approved or cleared by the [place]  FDA and  has been authorized for detection and/or diagnosis of SARS-CoV-2 by FDA under an Emergency Use Authorization (EUA). This EUA will remain  in effect (meaning this test can be used) for the duration of the COVID-19 declaration under Section 564(b)(1) of the Act, 21 U.S.C.section 360bbb-3(b)(1), unless the authorization is terminated  or revoked sooner.       Influenza A by PCR NEGATIVE NEGATIVE Final   Influenza B by PCR NEGATIVE NEGATIVE Final    Comment: (NOTE) The Xpert Xpress SARS-CoV-2/FLU/RSV plus assay is intended as an aid in the diagnosis of influenza from Nasopharyngeal swab specimens and should not be used as a sole basis for treatment. Nasal washings and aspirates are unacceptable for Xpert Xpress SARS-CoV-2/FLU/RSV testing.  Fact Sheet for Patients: bloggercourse.com  Fact Sheet for Healthcare Providers: seriousbroker.it  This test is not yet approved or cleared by the [place]  FDA and has been authorized for detection and/or diagnosis of SARS-CoV-2 by FDA under an Emergency Use Authorization (EUA). This EUA will remain in effect (meaning this test can be used) for the duration of the COVID-19 declaration under Section 564(b)(1) of the Act, 21 U.S.C. section 360bbb-3(b)(1), unless the authorization is terminated or revoked.     Resp Syncytial Virus by PCR NEGATIVE NEGATIVE Final    Comment: (NOTE) Fact Sheet for Patients: bloggercourse.com  Fact Sheet for Healthcare Providers: seriousbroker.it  This test is not yet approved or cleared by the [place]  FDA and has been authorized for detection and/or diagnosis of SARS-CoV-2 by FDA under an Emergency Use Authorization (EUA). This EUA will remain in effect (meaning this test can be used) for  the duration of the COVID-19 declaration under Section 564(b)(1) of the Act, 21 U.S.C. section 360bbb-3(b)(1), unless the authorization is terminated or revoked.  Performed at [facility], [address] [place], [place] [address]    *Note: Due to a large number of results and/or encounters for the requested time period, some results have not been displayed. A complete set of results can be found in Results Review.    Labs: CBC: Recent Labs  Lab 04/08/24 1245 04/09/24 0811 04/10/24 0529 04/11/24 0546 04/12/24 0421  WBC 6.2 6.7 8.1 7.3 7.4  NEUTROABS 4.8 5.5  --   --   --   HGB 11.5* 9.9* 11.8* 11.6* 11.5*  HCT 36.5* 32.3* 37.5* 36.3* 35.3*  MCV 102.2* 105.9* 102.5* 102.8* 101.1*  PLT 132* 106* 129* 141* 162   Basic Metabolic Panel: Recent Labs  Lab 04/09/24 0811 04/10/24 0529 04/11/24 0546 04/12/24 0421 04/13/24 0521  NA 143 142 141 137 139  K 3.8 3.9 4.1 4.5 4.7  CL 104 100 99 95* 96*  CO2 32 31 31 31 29   GLUCOSE 131* 136* 161* 132* 120*  BUN 30* 31* 37* 43* 47*  CREATININE 1.63* 1.61* 1.90* 1.90* 1.79*  CALCIUM  8.8* 9.7 9.5 9.4 9.5  MG 2.2  --   --   --   --   PHOS  --  2.9  --   --   --    Liver Function Tests: Recent Labs  Lab 04/08/24 1245 04/09/24 0811 04/10/24 0529  AST 34 26  --   ALT 28 23  --   ALKPHOS 128* 120  --   BILITOT 0.6 0.8  --   PROT 7.8 6.9  --   ALBUMIN  4.5 3.8 4.2   CBG: Recent Labs  Lab 04/12/24 0733 04/12/24 1140 04/12/24 1653 04/12/24 2113 04/13/24 0823  GLUCAP 123* 195* 141* 177* 124*    Discharge time spent: greater than 30 minutes.  Signed: Owen DELENA Lore, MD [facility] 04/13/2024

## 2024-04-13 NOTE — Progress Notes (Signed)
 Pt advised still with abd pain, reached out to MD Toribio. He change iv morphine  from Q4 to Q3hr and to give the first dose now.SABRA

## 2024-04-14 DIAGNOSIS — I5033 Acute on chronic diastolic (congestive) heart failure: Secondary | ICD-10-CM | POA: Diagnosis not present

## 2024-04-14 LAB — GLUCOSE, CAPILLARY
Glucose-Capillary: 136 mg/dL — ABNORMAL HIGH (ref 70–99)
Glucose-Capillary: 206 mg/dL — ABNORMAL HIGH (ref 70–99)

## 2024-04-14 NOTE — Progress Notes (Signed)
 PTAR at the bedside, patient stable at discharge.

## 2024-04-14 NOTE — Plan of Care (Signed)
  Problem: Education: Goal: Knowledge of General Education information will improve Description: Including pain rating scale, medication(s)/side effects and non-pharmacologic comfort measures Outcome: Progressing   Problem: Health Behavior/Discharge Planning: Goal: Ability to manage health-related needs will improve Outcome: Progressing   Problem: Clinical Measurements: Goal: Ability to maintain clinical measurements within normal limits will improve Outcome: Progressing Goal: Will remain free from infection Outcome: Progressing Goal: Diagnostic test results will improve Outcome: Progressing Goal: Respiratory complications will improve Outcome: Progressing Goal: Cardiovascular complication will be avoided Outcome: Progressing   Problem: Activity: Goal: Risk for activity intolerance will decrease Outcome: Progressing   Problem: Nutrition: Goal: Adequate nutrition will be maintained Outcome: Progressing   Problem: Coping: Goal: Level of anxiety will decrease Outcome: Progressing   Problem: Elimination: Goal: Will not experience complications related to bowel motility Outcome: Progressing Goal: Will not experience complications related to urinary retention Outcome: Progressing   Problem: Pain Managment: Goal: General experience of comfort will improve and/or be controlled Outcome: Progressing   Problem: Safety: Goal: Ability to remain free from injury will improve Outcome: Progressing   Problem: Skin Integrity: Goal: Risk for impaired skin integrity will decrease Outcome: Progressing   Problem: Education: Goal: Ability to demonstrate management of disease process will improve Outcome: Progressing Goal: Ability to verbalize understanding of medication therapies will improve Outcome: Progressing Goal: Individualized Educational Video(s) Outcome: Progressing   Problem: Activity: Goal: Capacity to carry out activities will improve Outcome: Progressing    Problem: Cardiac: Goal: Ability to achieve and maintain adequate cardiopulmonary perfusion will improve Outcome: Progressing   Problem: Education: Goal: Ability to describe self-care measures that may prevent or decrease complications (Diabetes Survival Skills Education) will improve Outcome: Progressing Goal: Individualized Educational Video(s) Outcome: Progressing   Problem: Coping: Goal: Ability to adjust to condition or change in health will improve Outcome: Progressing   Problem: Fluid Volume: Goal: Ability to maintain a balanced intake and output will improve Outcome: Progressing   Problem: Health Behavior/Discharge Planning: Goal: Ability to identify and utilize available resources and services will improve Outcome: Progressing Goal: Ability to manage health-related needs will improve Outcome: Progressing   Problem: Metabolic: Goal: Ability to maintain appropriate glucose levels will improve Outcome: Progressing   Problem: Nutritional: Goal: Maintenance of adequate nutrition will improve Outcome: Progressing Goal: Progress toward achieving an optimal weight will improve Outcome: Progressing   Problem: Skin Integrity: Goal: Risk for impaired skin integrity will decrease Outcome: Progressing   Problem: Tissue Perfusion: Goal: Adequacy of tissue perfusion will improve Outcome: Progressing

## 2024-04-14 NOTE — Discharge Summary (Signed)
 Physician Discharge Summary   Patient: [name] MRN: [id number] DOB: [birth year]  Admit date:     04/08/2024  Discharge date: 04/14/2024  Discharge Physician: Owen DELENA Lore   PCP: Larnell Hamilton, MD   Recommendations at discharge:   Needs Bmet to monitor renal function. Torsemide  change to 40 mg daily.    Discharge Diagnoses: Principal Problem:   Acute on chronic diastolic congestive heart failure (HCC) Active Problems:   Hypothyroidism   DM2 (diabetes mellitus, type 2) (HCC)   Chronic kidney disease, stage 3b (HCC)   PAF (paroxysmal atrial fibrillation) (HCC)   Essential hypertension   Sleep apnea   GERD without esophagitis   CAD (coronary artery disease)   COPD mixed type (HCC)   Gouty arthritis   Macrocytic anemia  Resolved Problems:   * No resolved hospital problems. *  Hospital Course: 73 year old with past medical history significant for mild aortic stenosis, osteoarthritis of the hand, asthma, A-fib, asthma, COPD, DDD, fatty liver, fibromyalgia, GERD, hyperlipidemia, hypothyroidism, history of intractable abdominal pain, cannabis dependence, previous morbid obesity, restless leg syndrome, cervical spinal stenosis, hypertension, first-degree AV block, second-degree AV block, history of pacemaker, chronic diastolic heart failure who was seen for chest pain in the ED subsequently hospitalist consulted for worsening dyspnea over the last few weeks with orthopnea, intermittent episode of precordial sharp chest pain in nature nonradiating.  Admitted for heart failure exacerbation.    Assessment and Plan: 1-Acute on Chronic Systolic Diastolic heart failure exacerbation: -Patient developed worsening shortness of breath, orthopnea precordial chest pain Echo showed left ventricular ejection fraction 45 to 50%, ejection fraction appears to have decreased compared to echo from February - Currently holding bisoprolol  due to acute heart failure exacerbation - Previous  weight 220 pounds.  Monitor daily weight; negative 4 L -Treated with IV lasix . Transition to Torsemide  starting tomorrow.  5.2 L weight fluctuates, likely not accurate. Down to 190  He will be discharge on torsemide  40 mg daily. Needs Bmet to monitor renal function.  He is stable, plan to discharge SNF today.   Chronic abdominal pain: Patient has a history of chronic abdominal pain.  CT abdomen and pelvis not show any acute finding No significant bladder or urinary retention Denies worsening pain.   Chronic kidney disease stage IIIb Baseline around 1.8 Monitor on diuretic Present with a creatinine of 2.0, stable at 1.9 Cr stable at 1.7  Diabetes type 2: Continue sliding scale insulin    Paroxysmal A-fib/essential hypertension/CAD Continue apixaban    Hypothyroidism - Continue levothyroxine    Mild thrombocytopenia - Monitor   History of COPD On chronic 2 L of oxygen  Continue with inhaler.   scheduled DuoNeb   Gout arthritis Continue allopurinol    Microcytic anemia Iron low, TSAT 7.  Folate more than 20, B12 975.  Discharge on oral iron     S/P right BKA     Estimated body mass index is 26.32 kg/m as calculated from the following:   Height as of this encounter: 5' 11 (1.803 m).   Weight as of this encounter: 85.6 kg.          Consultants: None Procedures performed: none Disposition: Skilled nursing facility Diet recommendation:  Cardiac diet DISCHARGE MEDICATION: Allergies as of 04/14/2024       Reactions   Cephalexin  Rash, Other (See Comments)   Broke out all over   Meloxicam Rash, Other (See Comments)   Red Man Syndrome, also   Vancomycin  Other (See Comments)   Red Man's syndrome 09/02/15, resolved  with diphenhydramine  and slowing of rate        Medication List     STOP taking these medications    bisoprolol  5 MG tablet Commonly known as: ZEBETA    ipratropium 0.03 % nasal spray Commonly known as: ATROVENT    oxyCODONE -acetaminophen   10-325 MG tablet Commonly known as: PERCOCET Replaced by: oxyCODONE -acetaminophen  5-325 MG tablet       TAKE these medications    rOPINIRole  1 MG tablet Commonly known as: REQUIP  Take 1 tablet (1 mg total) by mouth in the morning and at bedtime. What changed: when to take this The timing of this medication is very important.   acetaminophen  325 MG tablet Commonly known as: TYLENOL  Take 2 tablets (650 mg total) by mouth every 6 (six) hours as needed. What changed: reasons to take this   albuterol  108 (90 Base) MCG/ACT inhaler Commonly known as: VENTOLIN  HFA Inhale 2 puffs into the lungs every 6 (six) hours as needed for wheezing or shortness of breath.   allopurinol  300 MG tablet Commonly known as: ZYLOPRIM  Take 300 mg by mouth daily.   apixaban  5 MG Tabs tablet Commonly known as: Eliquis  Take 1 tablet (5 mg total) by mouth 2 (two) times daily.   Breztri  Aerosphere 160-9-4.8 MCG/ACT Aero inhaler Generic drug: budesonide -glycopyrrolate -formoterol  Inhale 2 puffs into the lungs in the morning and at bedtime.   Cal-Mag-Zinc -D Tabs Take 1 tablet by mouth at bedtime.   colchicine  0.6 MG tablet Take 0.6 mg by mouth daily as needed (for gout flares- as directed).   cyclobenzaprine  5 MG tablet Commonly known as: FLEXERIL  Take 2.5-5 mg by mouth 2 (two) times daily as needed for muscle spasms.   empagliflozin  10 MG Tabs tablet Commonly known as: Jardiance  Take 1 tablet (10 mg total) by mouth daily. What changed: when to take this   ferrous sulfate  325 (65 FE) MG tablet Take 1 tablet (325 mg total) by mouth daily with breakfast.   gabapentin  300 MG capsule Commonly known as: NEURONTIN  Take 1 capsule (300 mg total) by mouth 2 (two) times daily. What changed:  how much to take when to take this additional instructions   ipratropium-albuterol  0.5-2.5 (3) MG/3ML Soln Commonly known as: DUONEB Take 3 mLs by nebulization every 6 (six) hours. What changed:  when to take  this reasons to take this   levothyroxine  175 MCG tablet Commonly known as: SYNTHROID  Take 175 mcg by mouth daily before breakfast.   montelukast  10 MG tablet Commonly known as: SINGULAIR  TAKE 1 TABLET(10 MG) BY MOUTH AT BEDTIME What changed: See the new instructions.   multivitamin with minerals Tabs tablet Take 1 tablet by mouth daily. What changed: when to take this   ondansetron  4 MG tablet Commonly known as: ZOFRAN  Take 1 tablet (4 mg total) by mouth every 6 (six) hours. What changed:  when to take this reasons to take this   oxyCODONE  5 MG immediate release tablet Commonly known as: Oxy IR/ROXICODONE  Take 1 tablet (5 mg total) by mouth 3 (three) times daily as needed for moderate pain (pain score 4-6) or severe pain (pain score 7-10).   oxyCODONE -acetaminophen  5-325 MG tablet Commonly known as: PERCOCET/ROXICET Take 1 tablet by mouth 3 (three) times daily as needed for moderate pain (pain score 4-6) or severe pain (pain score 7-10). Replaces: oxyCODONE -acetaminophen  10-325 MG tablet   OXYGEN  Inhale 2 L/min into the lungs See admin instructions. Inhale 2 L/min into the lungs at bedtime and during the day as needed for shortness of  breath   pantoprazole  40 MG tablet Commonly known as: PROTONIX  Take 40 mg by mouth daily before breakfast. What changed: when to take this   polyethylene glycol powder 17 GM/SCOOP powder Commonly known as: GLYCOLAX /MIRALAX  Take 17 g by mouth 2 (two) times daily. Dissolve 1 capful (17g) in 4-8 ounces of liquid and take by mouth twice daily. What changed:  when to take this reasons to take this additional instructions   potassium chloride  SA 20 MEQ tablet Commonly known as: KLOR-CON  M Take 1 tablet (20 mEq total) by mouth daily. What changed: when to take this   predniSONE  2.5 MG tablet Commonly known as: DELTASONE  Take 1 tablet (2.5 mg total) by mouth daily with breakfast.   roflumilast  500 MCG Tabs tablet Commonly known as:  DALIRESP  Take 1 tablet (500 mcg total) by mouth daily.   rosuvastatin  10 MG tablet Commonly known as: CRESTOR  Take 10 mg by mouth at bedtime.   spironolactone  25 MG tablet Commonly known as: ALDACTONE  Take 1 tablet (25 mg total) by mouth daily. What changed: how much to take   Systane Ultra PF 0.4-0.3 % Soln Generic drug: Polyethyl Glyc-Propyl Glyc PF Place 1-2 drops into both eyes 3 (three) times daily as needed (for dryness).   TIGER BALM ARTHRITIS RUB EX Apply 1 application  topically as needed (for pain).   Torsemide  40 MG Tabs Take 40 mg by mouth daily. What changed:  medication strength how much to take how to take this when to take this additional instructions   traZODone  50 MG tablet Commonly known as: DESYREL  Take 50 mg by mouth at bedtime.   Vitamin D3 50 MCG (2000 UT) Tabs Take 4,000 Units by mouth daily.        Contact information for after-discharge care     Destination     [facility] .   Service: Skilled Nursing Contact information: [address] [place]  [address] [phone]                    Discharge Exam: Fredricka Weights   04/12/24 0721 04/13/24 0500 04/14/24 0413  Weight: 85.6 kg 86.4 kg 84.9 kg   General; No acute distress  Condition at discharge: stable  The results of significant diagnostics from this hospitalization (including imaging, microbiology, ancillary and laboratory) are listed below for reference.   Imaging Studies: ECHOCARDIOGRAM COMPLETE Result Date: 04/09/2024    ECHOCARDIOGRAM REPORT   Patient Name:   HADES MATHEW Date of Exam: 04/09/2024 Medical Rec #:  [id number]       Height:       71.0 in Accession #:    [id number]      Weight:       200.0 lb Date of Birth:  12-14-1950       BSA:          2.109 m Patient Age:    73 years        BP:           137/69 mmHg Patient Gender: M               HR:           64 bpm. Exam Location:  Inpatient Procedure: 2D Echo, Color Doppler and  Cardiac Doppler (Both Spectral and Color            Flow Doppler were utilized during procedure). Indications:    CHF- Acute Diastolic I50.31  History:  Patient has prior history of Echocardiogram examinations, most                 recent 06/12/2023. CHF, CAD, Pacemaker, Pulmonary HTN and COPD,                 Aortic Valve Disease and s/p AVR, there is a bioprosthetic valve                 present in the AO position. Procedure date: 2017; Risk                 Factors:Dyslipidemia, Hypertension and Diabetes.  Sonographer:    Koleen Popper RDCS Referring Phys: [id number] DAVID MANUEL ORTIZ IMPRESSIONS  1. Left ventricular ejection fraction, by estimation, is 45 to 50%. The left ventricle has mildly decreased function. The left ventricle demonstrates global hypokinesis with septal-lateral dyssynchrony consistent with RV pacing. Left ventricular diastolic parameters are indeterminate.  2. D-shaped interventricular septum suggestive of RV pressure/volume overload. Right ventricular systolic function is moderately reduced. The right ventricular size is moderately enlarged. There is moderately elevated pulmonary artery systolic pressure.  The estimated right ventricular systolic pressure is 58.0 mmHg.  3. Left atrial size was mildly dilated.  4. Right atrial size was moderately dilated.  5. The mitral valve is normal in structure. Mild mitral valve regurgitation. No evidence of mitral stenosis.  6. Bioprosthetic aortic valve. Trivial perivalvular leakage. Visually the valve opens well, mean gradient 7 mmHg. EOA 2.25 cm^2.  7. The inferior vena cava is dilated in size with <50% respiratory variability, suggesting right atrial pressure of 15 mmHg.  8. Technically difficult study with poor acoustic windows. FINDINGS  Left Ventricle: Left ventricular ejection fraction, by estimation, is 45 to 50%. The left ventricle has mildly decreased function. The left ventricle demonstrates global hypokinesis. The left ventricular  internal cavity size was normal in size. There is  no left ventricular hypertrophy. Left ventricular diastolic parameters are indeterminate. Right Ventricle: D-shaped interventricular septum suggestive of RV pressure/volume overload. The right ventricular size is moderately enlarged. No increase in right ventricular wall thickness. Right ventricular systolic function is moderately reduced. There is moderately elevated pulmonary artery systolic pressure. The tricuspid regurgitant velocity is 3.28 m/s, and with an assumed right atrial pressure of 15 mmHg, the estimated right ventricular systolic pressure is 58.0 mmHg. Left Atrium: Left atrial size was mildly dilated. Right Atrium: Right atrial size was moderately dilated. Pericardium: There is no evidence of pericardial effusion. Mitral Valve: The mitral valve is normal in structure. Mild mitral annular calcification. Mild mitral valve regurgitation. No evidence of mitral valve stenosis. Tricuspid Valve: The tricuspid valve is normal in structure. Tricuspid valve regurgitation is trivial. Aortic Valve: Bioprosthetic aortic valve. Trivial perivalvular leakage. Visually the valve opens well, mean gradient 7 mmHg. EOA 2.25 cm^2. Aortic valve mean gradient measures 7.0 mmHg. Aortic valve peak gradient measures 13.5 mmHg. Aortic valve area, by  VTI measures 2.25 cm. Pulmonic Valve: The pulmonic valve was not well visualized. Pulmonic valve regurgitation is not visualized. Aorta: The aortic root is normal in size and structure. Venous: The inferior vena cava is dilated in size with less than 50% respiratory variability, suggesting right atrial pressure of 15 mmHg. IAS/Shunts: No atrial level shunt detected by color flow Doppler. Additional Comments: A device lead is visualized in the right ventricle.  LEFT VENTRICLE PLAX 2D LVIDd:         5.00 cm LVIDs:         3.40 cm LV  PW:         1.10 cm LV IVS:        0.90 cm LVOT diam:     1.90 cm LV SV:         80 LV SV Index:   38  LVOT Area:     2.84 cm  LV Volumes (MOD) LV vol d, MOD A2C: 168.0 ml LV vol d, MOD A4C: 114.0 ml LV vol s, MOD A2C: 76.0 ml LV vol s, MOD A4C: 55.7 ml LV SV MOD A2C:     92.0 ml LV SV MOD A4C:     114.0 ml LV SV MOD BP:      80.1 ml RIGHT VENTRICLE RV Basal diam:  5.00 cm RV Mid diam:    4.10 cm RV S prime:     5.03 cm/s TAPSE (M-mode): 0.8 cm LEFT ATRIUM             Index        RIGHT ATRIUM           Index LA diam:        4.70 cm 2.23 cm/m   RA Area:     24.90 cm LA Vol (A2C):   59.0 ml 27.98 ml/m  RA Volume:   85.40 ml  40.50 ml/m LA Vol (A4C):   59.2 ml 28.08 ml/m LA Biplane Vol: 61.6 ml 29.21 ml/m  AORTIC VALVE AV Area (Vmax):    2.43 cm AV Area (Vmean):   2.35 cm AV Area (VTI):     2.25 cm AV Vmax:           184.00 cm/s AV Vmean:          119.000 cm/s AV VTI:            0.356 m AV Peak Grad:      13.5 mmHg AV Mean Grad:      7.0 mmHg LVOT Vmax:         158.00 cm/s LVOT Vmean:        98.800 cm/s LVOT VTI:          0.283 m LVOT/AV VTI ratio: 0.79  AORTA Ao Root diam: 2.90 cm MITRAL VALVE                TRICUSPID VALVE MV Area (PHT): 4.80 cm     TR Peak grad:   43.0 mmHg MV Decel Time: 158 msec     TR Vmax:        328.00 cm/s MR Peak grad: 53.0 mmHg MR Vmax:      364.00 cm/s   SHUNTS MV E velocity: 107.00 cm/s  Systemic VTI:  0.28 m MV A velocity: 32.50 cm/s   Systemic Diam: 1.90 cm MV E/A ratio:  3.29 Dalton McleanMD Electronically signed by Ezra Kanner Signature Date/Time: 04/09/2024/4:05:33 PM    Final    VAS US  LOWER EXTREMITY VENOUS (DVT) (7a-7p) Result Date: 04/08/2024  Lower Venous DVT Study Patient Name:  CHUKWUKA FESTA  Date of Exam:   04/08/2024 Medical Rec #: [id number]        Accession #:    [id number] Date of Birth: [birth year]        Patient Gender: M Patient Age:   89 years Exam Location:  [facility] Procedure:      VAS US  LOWER EXTREMITY VENOUS (DVT) Referring Phys: JAYSON PEREYRA --------------------------------------------------------------------------------  Indications:  Pain.  Risk Factors: None identified. Limitations: Poor ultrasound/tissue interface. Comparison Study: No prior studies.  Performing Technologist: Cordella Collet RVT  Examination Guidelines: A complete evaluation includes B-mode imaging, spectral Doppler, color Doppler, and power Doppler as needed of all accessible portions of each vessel. Bilateral testing is considered an integral part of a complete examination. Limited examinations for reoccurring indications may be performed as noted. The reflux portion of the exam is performed with the patient in reverse Trendelenburg.  +-----+---------------+---------+-----------+----------+--------------+ RIGHTCompressibilityPhasicitySpontaneityPropertiesThrombus Aging +-----+---------------+---------+-----------+----------+--------------+ CFV  Full           Yes      No                                  +-----+---------------+---------+-----------+----------+--------------+   +---------+---------------+---------+-----------+----------+--------------+ LEFT     CompressibilityPhasicitySpontaneityPropertiesThrombus Aging +---------+---------------+---------+-----------+----------+--------------+ CFV      Full           Yes      No                                  +---------+---------------+---------+-----------+----------+--------------+ SFJ      Full                                                        +---------+---------------+---------+-----------+----------+--------------+ FV Prox  Full                                                        +---------+---------------+---------+-----------+----------+--------------+ FV Mid   Full                                                        +---------+---------------+---------+-----------+----------+--------------+ FV DistalFull                                                        +---------+---------------+---------+-----------+----------+--------------+ PFV      Full                                                         +---------+---------------+---------+-----------+----------+--------------+ POP      Full           Yes      No                                  +---------+---------------+---------+-----------+----------+--------------+ PTV      Full                                                        +---------+---------------+---------+-----------+----------+--------------+  PERO     Full                                                        +---------+---------------+---------+-----------+----------+--------------+    Summary: RIGHT: - No evidence of common femoral vein obstruction.   LEFT: - There is no evidence of deep vein thrombosis in the lower extremity.  - No cystic structure found in the popliteal fossa.  *See table(s) above for measurements and observations. Electronically signed by Debby Robertson on 04/08/2024 at 10:25:17 PM.    Final    CT Angio Chest PE W and/or Wo Contrast Result Date: 04/08/2024 CLINICAL DATA:  Concern for pulmonary embolism. Abdominal pain. History of renal cell carcinoma status post prior ablation of the left kidney. EXAM: CT ANGIOGRAPHY CHEST CT ABDOMEN AND PELVIS WITH CONTRAST TECHNIQUE: Multidetector CT imaging of the chest was performed using the standard protocol during bolus administration of intravenous contrast. Multiplanar CT image reconstructions and MIPs were obtained to evaluate the vascular anatomy. Multidetector CT imaging of the abdomen and pelvis was performed using the standard protocol during bolus administration of intravenous contrast. RADIATION DOSE REDUCTION: This exam was performed according to the departmental dose-optimization program which includes automated exposure control, adjustment of the mA and/or kV according to patient size and/or use of iterative reconstruction technique. CONTRAST:  75mL OMNIPAQUE  IOHEXOL  350 MG/ML SOLN COMPARISON:  Chest radiograph dated 04/08/2024. CT abdomen  pelvis dated 01/02/2024. FINDINGS: CTA CHEST FINDINGS Cardiovascular: There is no cardiomegaly or pericardial effusion. Retrograde flow of contrast from the right atrium into the IVC 2 suggestive of right heart dysfunction. There is coronary vascular calcification. Mechanical aortic valve. There is mild atherosclerotic calcification of the thoracic aorta. No pulmonary artery embolus identified. Mediastinum/Nodes: No hilar or mediastinal adenopathy. The esophagus is grossly unremarkable. No mediastinal fluid collection. Lungs/Pleura: No focal consolidation, pleural effusion, pneumothorax. Small scattered calcified granuloma. The central airways are patent. Musculoskeletal: Median sternotomy wires. Left pectoral pacemaker device. No acute osseous pathology. Osteopenia with degenerative changes of the spine. Review of the MIP images confirms the above findings. CT ABDOMEN and PELVIS FINDINGS No intra-abdominal free air or free fluid. Hepatobiliary: Slight irregularity of the liver contour suspicious for early changes of cirrhosis. No biliary dilatation. Small gallstone. No pericholecystic fluid or evidence of acute cholecystitis by CT. Pancreas: Unremarkable. No pancreatic ductal dilatation or surrounding inflammatory changes. Spleen: Normal in size without focal abnormality. Adrenals/Urinary Tract: The adrenal glands unremarkable. Mild left and severe right renal atrophy. Similar appearance of a 1.5 cm post ablation treatment of the left kidney. There is no hydronephrosis on either side. The visualized ureters and urinary bladder appear unremarkable. Stomach/Bowel: There is no bowel obstruction or active inflammation. The appendix is normal. Vascular/Lymphatic: Advanced aortoiliac atherosclerotic disease. The IVC is unremarkable. No portal venous gas. There is no adenopathy. Reproductive: The prostate and seminal vesicles are grossly normal. Other: Small fat containing bilateral inguinal hernias. Small fat containing  umbilical hernia. Musculoskeletal: Osteopenia.  No acute osseous pathology. Review of the MIP images confirms the above findings. IMPRESSION: 1. No acute intrathoracic, abdominal, or pelvic pathology. No CT evidence of pulmonary artery embolus. 2. Cholelithiasis. 3. Similar appearance of a 1.5 cm post ablation treatment of the left kidney. 4.  Aortic Atherosclerosis (ICD10-I70.0). Electronically Signed   By: Vanetta Chou M.D.   On:  04/08/2024 16:22   CT ABDOMEN PELVIS W CONTRAST Result Date: 04/08/2024 CLINICAL DATA:  Concern for pulmonary embolism. Abdominal pain. History of renal cell carcinoma status post prior ablation of the left kidney. EXAM: CT ANGIOGRAPHY CHEST CT ABDOMEN AND PELVIS WITH CONTRAST TECHNIQUE: Multidetector CT imaging of the chest was performed using the standard protocol during bolus administration of intravenous contrast. Multiplanar CT image reconstructions and MIPs were obtained to evaluate the vascular anatomy. Multidetector CT imaging of the abdomen and pelvis was performed using the standard protocol during bolus administration of intravenous contrast. RADIATION DOSE REDUCTION: This exam was performed according to the departmental dose-optimization program which includes automated exposure control, adjustment of the mA and/or kV according to patient size and/or use of iterative reconstruction technique. CONTRAST:  75mL OMNIPAQUE  IOHEXOL  350 MG/ML SOLN COMPARISON:  Chest radiograph dated 04/08/2024. CT abdomen pelvis dated 01/02/2024. FINDINGS: CTA CHEST FINDINGS Cardiovascular: There is no cardiomegaly or pericardial effusion. Retrograde flow of contrast from the right atrium into the IVC 2 suggestive of right heart dysfunction. There is coronary vascular calcification. Mechanical aortic valve. There is mild atherosclerotic calcification of the thoracic aorta. No pulmonary artery embolus identified. Mediastinum/Nodes: No hilar or mediastinal adenopathy. The esophagus is grossly  unremarkable. No mediastinal fluid collection. Lungs/Pleura: No focal consolidation, pleural effusion, pneumothorax. Small scattered calcified granuloma. The central airways are patent. Musculoskeletal: Median sternotomy wires. Left pectoral pacemaker device. No acute osseous pathology. Osteopenia with degenerative changes of the spine. Review of the MIP images confirms the above findings. CT ABDOMEN and PELVIS FINDINGS No intra-abdominal free air or free fluid. Hepatobiliary: Slight irregularity of the liver contour suspicious for early changes of cirrhosis. No biliary dilatation. Small gallstone. No pericholecystic fluid or evidence of acute cholecystitis by CT. Pancreas: Unremarkable. No pancreatic ductal dilatation or surrounding inflammatory changes. Spleen: Normal in size without focal abnormality. Adrenals/Urinary Tract: The adrenal glands unremarkable. Mild left and severe right renal atrophy. Similar appearance of a 1.5 cm post ablation treatment of the left kidney. There is no hydronephrosis on either side. The visualized ureters and urinary bladder appear unremarkable. Stomach/Bowel: There is no bowel obstruction or active inflammation. The appendix is normal. Vascular/Lymphatic: Advanced aortoiliac atherosclerotic disease. The IVC is unremarkable. No portal venous gas. There is no adenopathy. Reproductive: The prostate and seminal vesicles are grossly normal. Other: Small fat containing bilateral inguinal hernias. Small fat containing umbilical hernia. Musculoskeletal: Osteopenia.  No acute osseous pathology. Review of the MIP images confirms the above findings. IMPRESSION: 1. No acute intrathoracic, abdominal, or pelvic pathology. No CT evidence of pulmonary artery embolus. 2. Cholelithiasis. 3. Similar appearance of a 1.5 cm post ablation treatment of the left kidney. 4.  Aortic Atherosclerosis (ICD10-I70.0). Electronically Signed   By: Vanetta Chou M.D.   On: 04/08/2024 16:22   US  SCROTUM  W/DOPPLER Result Date: 04/08/2024 EXAM: ULTRASOUND SCROTUM/TESTICLES WITH DOPPLER FLOW EVALUATION 04/08/2024 03:29:00 PM TECHNIQUE: Duplex ultrasound using B-mode/gray scaled imaging, Doppler spectral analysis and color flow Doppler was obtained of the testicles. COMPARISON: 09/17/2017 CLINICAL HISTORY: left testicle pain/swelling FINDINGS: RIGHT: GREY SCALE: The right testicle measures 3.8 x 3.0 x 2.7 cm. It demonstrates normal homogeneous echotexture without focal lesion. No testicular microlithiasis. DOPPLER EVALUATION: There is normal arterial and venous Doppler flow within the testicle. VARICOCELE: No scrotal varicocele. SCROTAL SAC: Small hydrocele. EPIDIDYMIS: No acute abnormality. LEFT: GREY SCALE: The left testicle measures 4.0 x 3.1 x 1.9 cm. It demonstrates normal homogeneous echotexture without focal lesion. No testicular microlithiasis. DOPPLER EVALUATION: There is normal  arterial and venous Doppler flow within the testicle. VARICOCELE: No scrotal varicocele. SCROTAL SAC: Small hydrocele. EPIDIDYMIS: Left epididymal cyst. No acute abnormality. IMPRESSION: 1. Small bilateral hydroceles. 2. Left epididymal head cyst measuring up to 1.9 cm. Electronically signed by: Lynwood Seip MD 04/08/2024 03:58 PM EST RP Workstation: [id number]   DG Chest 2 View Result Date: 04/08/2024 CLINICAL DATA:  Chest pain. EXAM: CHEST - 2 VIEW COMPARISON:  Chest radiograph dated 03/16/2024. FINDINGS: No focal consolidation, pleural effusion or pneumothorax. Enlargement of the bilateral hila, suggestive of pulmonary hypertension. Clinical correlation recommended. Stable mild cardiomegaly. 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 Left pectoral pacemaker device. No acute osseous pathology. IMPRESSION: 1. No acute cardiopulmonary process. 2. Mild cardiomegaly. Electronically Signed   By: Vanetta Chou M.D.   On: 04/08/2024 14:27   VAS US  LOWER EXTREMITY VENOUS (DVT) (7a-7p) Result Date: 03/16/2024  Lower  Venous DVT Study Patient Name:  LAVONTE PALOS  Date of Exam:   03/16/2024 Medical Rec #: [id number]        Accession #:    [id number] Date of Birth: 03-03-1951        Patient Gender: M Patient Age:   9 years Exam Location:  [facility] Procedure:      VAS US  LOWER EXTREMITY VENOUS (DVT) Referring Phys: [facility] MEREDITH --------------------------------------------------------------------------------  Indications: Pain, and Erythema. Other Indications: A-fib on Eliquis , SOB due to COPD. Comparison Study: No prior exam. Performing Technologist: Edilia Elden Appl  Examination Guidelines: A complete evaluation includes B-mode imaging, spectral Doppler, color Doppler, and power Doppler as needed of all accessible portions of each vessel. Bilateral testing is considered an integral part of a complete examination. Limited examinations for reoccurring indications may be performed as noted. The reflux portion of the exam is performed with the patient in reverse Trendelenburg.  +-----+---------------+---------+-----------+----------+--------------+ RIGHTCompressibilityPhasicitySpontaneityPropertiesThrombus Aging +-----+---------------+---------+-----------+----------+--------------+ CFV  Full           Yes      Yes                                 +-----+---------------+---------+-----------+----------+--------------+ SFJ  Full           Yes      Yes                                 +-----+---------------+---------+-----------+----------+--------------+   +---------+---------------+---------+-----------+----------+--------------+ LEFT     CompressibilityPhasicitySpontaneityPropertiesThrombus Aging +---------+---------------+---------+-----------+----------+--------------+ CFV      Full           Yes      Yes                                 +---------+---------------+---------+-----------+----------+--------------+ SFJ      Full           Yes      Yes                                  +---------+---------------+---------+-----------+----------+--------------+ FV Prox  Full                                                        +---------+---------------+---------+-----------+----------+--------------+  FV Mid   Full                                                        +---------+---------------+---------+-----------+----------+--------------+ FV DistalFull                                                        +---------+---------------+---------+-----------+----------+--------------+ PFV      Full                                                        +---------+---------------+---------+-----------+----------+--------------+ POP      Full           Yes      Yes                                 +---------+---------------+---------+-----------+----------+--------------+ PTV      Full                                                        +---------+---------------+---------+-----------+----------+--------------+ PERO     Full                                                        +---------+---------------+---------+-----------+----------+--------------+ Complex structure noted in the left popliteal fossa measuring 3.6 x 1 x 2.2 cm.   Summary: RIGHT: - No evidence of common femoral vein obstruction.   LEFT: - There is no evidence of deep vein thrombosis in the lower extremity.  - A cystic structure is found in the popliteal fossa.  *See table(s) above for measurements and observations. Electronically signed by Debby Robertson on 03/16/2024 at 12:45:57 PM.    Final    DG Hip Unilat W or Wo Pelvis 2-3 Views Left Result Date: 03/16/2024 EXAM: 2 OR MORE VIEW(S) XRAY OF THE HIP 03/16/2024 04:46:00 AM COMPARISON: None available. CLINICAL HISTORY: hip pain FINDINGS: BONES AND JOINTS: No acute fracture or focal osseous lesion. The hip joint is maintained. No significant degenerative changes. SOFT TISSUES: The soft tissues are unremarkable.  IMPRESSION: 1. No acute osseous abnormality Electronically signed by: Waddell Calk MD 03/16/2024 06:08 AM EST RP Workstation: [id number]   DG Chest 2 View Result Date: 03/16/2024 EXAM: 2 VIEW(S) XRAY OF THE CHEST 03/16/2024 04:46:00 AM COMPARISON: 06/14/2023 CLINICAL HISTORY: SOB FINDINGS: LINES, TUBES AND DEVICES: Left cardiac pacing device. LUNGS AND PLEURA: Increased pulmonary vascular congestion. No focal pulmonary opacity. No pleural effusion or frank edema. No pneumothorax. HEART AND MEDIASTINUM: Unchanged cardiomegaly. Left atrial appendage clip. Aortic atherosclerotic calcifications. Status post aortic valve repair. BONES AND SOFT TISSUES: Status post median sternotomy. Mild  thoracic spine degenerative changes. No acute osseous abnormality. IMPRESSION: 1. Increased pulmonary vascular congestion without pleural effusion or frank edema. Electronically signed by: Waddell Calk MD 03/16/2024 06:07 AM EST RP Workstation: [id number]    Microbiology: Results for orders placed or performed during the hospital encounter of 04/08/24  Resp panel by RT-PCR (RSV, Flu A&B, Covid) Anterior Nasal Swab     Status: None   Collection Time: 04/08/24  1:00 PM   Specimen: Anterior Nasal Swab  Result Value Ref Range Status   SARS Coronavirus 2 by RT PCR NEGATIVE NEGATIVE Final    Comment: (NOTE) SARS-CoV-2 target nucleic acids are NOT DETECTED.  The SARS-CoV-2 RNA is generally detectable in upper respiratory specimens during the acute phase of infection. The lowest concentration of SARS-CoV-2 viral copies this assay can detect is 138 copies/mL. A negative result does not preclude SARS-Cov-2 infection and should not be used as the sole basis for treatment or other patient management decisions. A negative result may occur with  improper specimen collection/handling, submission of specimen other than nasopharyngeal swab, presence of viral mutation(s) within the areas targeted by this assay, and inadequate  number of viral copies(<138 copies/mL). A negative result must be combined with clinical observations, patient history, and epidemiological information. The expected result is Negative.  Fact Sheet for Patients:  bloggercourse.com  Fact Sheet for Healthcare Providers:  seriousbroker.it  This test is no t yet approved or cleared by the [place]  FDA and  has been authorized for detection and/or diagnosis of SARS-CoV-2 by FDA under an Emergency Use Authorization (EUA). This EUA will remain  in effect (meaning this test can be used) for the duration of the COVID-19 declaration under Section 564(b)(1) of the Act, 21 U.S.C.section 360bbb-3(b)(1), unless the authorization is terminated  or revoked sooner.       Influenza A by PCR NEGATIVE NEGATIVE Final   Influenza B by PCR NEGATIVE NEGATIVE Final    Comment: (NOTE) The Xpert Xpress SARS-CoV-2/FLU/RSV plus assay is intended as an aid in the diagnosis of influenza from Nasopharyngeal swab specimens and should not be used as a sole basis for treatment. Nasal washings and aspirates are unacceptable for Xpert Xpress SARS-CoV-2/FLU/RSV testing.  Fact Sheet for Patients: bloggercourse.com  Fact Sheet for Healthcare Providers: seriousbroker.it  This test is not yet approved or cleared by the [place]  FDA and has been authorized for detection and/or diagnosis of SARS-CoV-2 by FDA under an Emergency Use Authorization (EUA). This EUA will remain in effect (meaning this test can be used) for the duration of the COVID-19 declaration under Section 564(b)(1) of the Act, 21 U.S.C. section 360bbb-3(b)(1), unless the authorization is terminated or revoked.     Resp Syncytial Virus by PCR NEGATIVE NEGATIVE Final    Comment: (NOTE) Fact Sheet for Patients: bloggercourse.com  Fact Sheet for Healthcare  Providers: seriousbroker.it  This test is not yet approved or cleared by the [place]  FDA and has been authorized for detection and/or diagnosis of SARS-CoV-2 by FDA under an Emergency Use Authorization (EUA). This EUA will remain in effect (meaning this test can be used) for the duration of the COVID-19 declaration under Section 564(b)(1) of the Act, 21 U.S.C. section 360bbb-3(b)(1), unless the authorization is terminated or revoked.  Performed at [facility], [address], [place], [place] [address]    *Note: Due to a large number of results and/or encounters for the requested time period, some results have not been displayed. A complete set of results can be  found in Results Review.    Labs: CBC: Recent Labs  Lab 04/08/24 1245 04/09/24 0811 04/10/24 0529 04/11/24 0546 04/12/24 0421  WBC 6.2 6.7 8.1 7.3 7.4  NEUTROABS 4.8 5.5  --   --   --   HGB 11.5* 9.9* 11.8* 11.6* 11.5*  HCT 36.5* 32.3* 37.5* 36.3* 35.3*  MCV 102.2* 105.9* 102.5* 102.8* 101.1*  PLT 132* 106* 129* 141* 162   Basic Metabolic Panel: Recent Labs  Lab 04/09/24 0811 04/10/24 0529 04/11/24 0546 04/12/24 0421 04/13/24 0521  NA 143 142 141 137 139  K 3.8 3.9 4.1 4.5 4.7  CL 104 100 99 95* 96*  CO2 32 31 31 31 29   GLUCOSE 131* 136* 161* 132* 120*  BUN 30* 31* 37* 43* 47*  CREATININE 1.63* 1.61* 1.90* 1.90* 1.79*  CALCIUM  8.8* 9.7 9.5 9.4 9.5  MG 2.2  --   --   --   --   PHOS  --  2.9  --   --   --    Liver Function Tests: Recent Labs  Lab 04/08/24 1245 04/09/24 0811 04/10/24 0529  AST 34 26  --   ALT 28 23  --   ALKPHOS 128* 120  --   BILITOT 0.6 0.8  --   PROT 7.8 6.9  --   ALBUMIN  4.5 3.8 4.2   CBG: Recent Labs  Lab 04/13/24 0823 04/13/24 1213 04/13/24 1728 04/13/24 2052 04/14/24 0801  GLUCAP 124* 165* 134* 156* 136*    Discharge time spent: greater than 30 minutes.  Signed: Owen DELENA Lore, MD [facility] 04/14/2024

## 2024-04-14 NOTE — TOC Transition Note (Addendum)
 Transition of Care [facility]) - Discharge Note   Patient Details  Name: [name] MRN: [id number] Date of Birth: 07/15/1950  [facility] CM/SW Contact:  Bascom Service, RN Phone Number: 04/14/2024, 9:56 AM   Clinical Narrative:auth received for [facility](bed available)-await  d/c summary w/todays date);d/c summary sent await rm# tel# for report prior PTAR. -10:54a-Going to [facility] aware rm#[phone], report tel#[phone]-[phone] scheduled for 1p pick up. No further CM needs.      Final next level of care: Skilled Nursing Facility Barriers to Discharge: No Barriers Identified   Patient Goals and CMS Choice Patient states their goals for this hospitalization and ongoing recovery are:: Rehab CMS Medicare.gov Compare Post Acute Care list provided to:: Patient Choice offered to / list presented to : Spouse Melvin ownership interest in [facility].provided to:: Spouse    Discharge Placement                       Discharge Plan and Services Additional resources added to the After Visit Summary for     Discharge Planning Services: CM Consult Post Acute Care Choice: Skilled Nursing Facility                               Social Drivers of Health (SDOH) Interventions SDOH Screenings   Food Insecurity: No Food Insecurity (04/09/2024)  Housing: Low Risk (04/09/2024)  Transportation Needs: No Transportation Needs (04/09/2024)  Utilities: Not At Risk (04/09/2024)  Social Connections: Moderately Integrated (04/09/2024)  Tobacco Use: Medium Risk (04/09/2024)     Readmission Risk Interventions    12/23/2022   11:24 AM 01/01/2022   10:54 AM  Readmission Risk Prevention Plan  Transportation Screening Complete Complete  PCP or Specialist Appt within 3-5 Days  Complete  HRI or Home Care Consult  Complete  Social Work Consult for Recovery Care Planning/Counseling  Complete  Palliative Care Screening  Not Applicable  Medication  Review Oceanographer) Complete Complete  PCP or Specialist appointment within 3-5 days of discharge Complete   HRI or Home Care Consult Complete   SW Recovery Care/Counseling Consult Complete   Palliative Care Screening Complete   Skilled Nursing Facility Not Applicable

## 2024-04-14 NOTE — Progress Notes (Signed)
 Report called and given to [facility].

## 2024-04-21 ENCOUNTER — Other Ambulatory Visit: Payer: Self-pay | Admitting: Pulmonary Disease

## 2024-04-21 NOTE — Telephone Encounter (Signed)
 Per LOV notes:  No requirement for prednisone  since last visit  Please advise if you would like this to be refilled. Thank you!

## 2024-04-29 ENCOUNTER — Emergency Department (HOSPITAL_COMMUNITY)

## 2024-04-29 ENCOUNTER — Emergency Department (HOSPITAL_COMMUNITY)
Admission: EM | Admit: 2024-04-29 | Discharge: 2024-04-29 | Disposition: A | Discharge status: Skilled Nursing Facility | Attending: Emergency Medicine | Admitting: Emergency Medicine

## 2024-04-29 ENCOUNTER — Encounter (HOSPITAL_COMMUNITY): Payer: Self-pay | Admitting: Emergency Medicine

## 2024-04-29 ENCOUNTER — Other Ambulatory Visit: Payer: Self-pay

## 2024-04-29 DIAGNOSIS — R0689 Other abnormalities of breathing: Secondary | ICD-10-CM | POA: Diagnosis not present

## 2024-04-29 DIAGNOSIS — D72829 Elevated white blood cell count, unspecified: Secondary | ICD-10-CM | POA: Insufficient documentation

## 2024-04-29 DIAGNOSIS — R1013 Epigastric pain: Secondary | ICD-10-CM | POA: Insufficient documentation

## 2024-04-29 DIAGNOSIS — I10 Essential (primary) hypertension: Secondary | ICD-10-CM | POA: Insufficient documentation

## 2024-04-29 DIAGNOSIS — I4891 Unspecified atrial fibrillation: Secondary | ICD-10-CM | POA: Diagnosis not present

## 2024-04-29 DIAGNOSIS — Z7901 Long term (current) use of anticoagulants: Secondary | ICD-10-CM | POA: Insufficient documentation

## 2024-04-29 DIAGNOSIS — R079 Chest pain, unspecified: Secondary | ICD-10-CM

## 2024-04-29 LAB — COMPREHENSIVE METABOLIC PANEL WITH GFR
ALT: 19 U/L (ref 0–44)
AST: 25 U/L (ref 15–41)
Albumin: 4.3 g/dL (ref 3.5–5.0)
Alkaline Phosphatase: 107 U/L (ref 38–126)
Anion gap: 14 (ref 5–15)
BUN: 51 mg/dL — ABNORMAL HIGH (ref 8–23)
CO2: 27 mmol/L (ref 22–32)
Calcium: 9.7 mg/dL (ref 8.9–10.3)
Chloride: 92 mmol/L — ABNORMAL LOW (ref 98–111)
Creatinine, Ser: 1.44 mg/dL — ABNORMAL HIGH (ref 0.61–1.24)
GFR, Estimated: 51 mL/min — ABNORMAL LOW
Glucose, Bld: 158 mg/dL — ABNORMAL HIGH (ref 70–99)
Potassium: 4.4 mmol/L (ref 3.5–5.1)
Sodium: 133 mmol/L — ABNORMAL LOW (ref 135–145)
Total Bilirubin: 0.5 mg/dL (ref 0.0–1.2)
Total Protein: 7.6 g/dL (ref 6.5–8.1)

## 2024-04-29 LAB — URINALYSIS, W/ REFLEX TO CULTURE (INFECTION SUSPECTED)
Bacteria, UA: NONE SEEN
Bilirubin Urine: NEGATIVE
Glucose, UA: >=500 mg/dL — AB
Hgb urine dipstick: NEGATIVE
Ketones, ur: NEGATIVE mg/dL
Leukocytes,Ua: NEGATIVE
Nitrite: NEGATIVE
Protein, ur: 30 mg/dL — AB
Specific Gravity, Urine: 1.008 (ref 1.005–1.030)
pH: 7 (ref 5.0–8.0)

## 2024-04-29 LAB — CBC WITH DIFFERENTIAL/PLATELET
Abs Immature Granulocytes: 0.12 K/uL — ABNORMAL HIGH (ref 0.00–0.07)
Basophils Absolute: 0.1 K/uL (ref 0.0–0.1)
Basophils Relative: 0 %
Eosinophils Absolute: 0.1 K/uL (ref 0.0–0.5)
Eosinophils Relative: 1 %
HCT: 42 % (ref 39.0–52.0)
Hemoglobin: 14 g/dL (ref 13.0–17.0)
Immature Granulocytes: 1 %
Lymphocytes Relative: 7 %
Lymphs Abs: 1 K/uL (ref 0.7–4.0)
MCH: 32.3 pg (ref 26.0–34.0)
MCHC: 33.3 g/dL (ref 30.0–36.0)
MCV: 96.8 fL (ref 80.0–100.0)
Monocytes Absolute: 0.9 K/uL (ref 0.1–1.0)
Monocytes Relative: 7 %
Neutro Abs: 11.6 K/uL — ABNORMAL HIGH (ref 1.7–7.7)
Neutrophils Relative %: 84 %
Platelets: 227 K/uL (ref 150–400)
RBC: 4.34 MIL/uL (ref 4.22–5.81)
RDW: 15.7 % — ABNORMAL HIGH (ref 11.5–15.5)
WBC: 13.8 K/uL — ABNORMAL HIGH (ref 4.0–10.5)
nRBC: 0 % (ref 0.0–0.2)

## 2024-04-29 LAB — TROPONIN T, HIGH SENSITIVITY
Troponin T High Sensitivity: 87 ng/L — ABNORMAL HIGH (ref 0–19)
Troponin T High Sensitivity: 87 ng/L — ABNORMAL HIGH (ref 0–19)

## 2024-04-29 LAB — PRO BRAIN NATRIURETIC PEPTIDE: Pro Brain Natriuretic Peptide: 761 pg/mL — ABNORMAL HIGH

## 2024-04-29 LAB — LIPASE, BLOOD: Lipase: 25 U/L (ref 11–51)

## 2024-04-29 NOTE — ED Provider Notes (Signed)
 " [place] [facility]PARTMENT AT [facility] Provider Note   CSN: [id number] Arrival date & time: 04/29/24  0117     Patient presents with: Chest Pain   [name] is a 74 y.o. male.   The history is provided by the patient and medical records.  Chest Pain Associated symptoms: abdominal pain    74 year old male with history of hypertension, mitral regurgitation, fatty liver, dyslipidemia, fibromyalgia, restless leg, A-fib on Coumadin , sleep apnea, presenting to the ED for chest and abdominal pain.  States pain began around 11:30 PM.  Reports pain in the center of the chest and radiates to upper abdomen.  He denies any nausea, vomiting, or diarrhea.  Denies any increase in shortness of breath.  He is on his baseline 3 L.  He has not had any cough or fever.  He was given aspirin  and 2 sublingual nitroglycerin .  He remains hemodynamically stable on arrival to ED.  Prior to Admission medications  Medication Sig Start Date End Date Taking? Authorizing Provider  acetaminophen  (TYLENOL ) 325 MG tablet Take 2 tablets (650 mg total) by mouth every 6 (six) hours as needed. Patient taking differently: Take 650 mg by mouth every 6 (six) hours as needed for mild pain (pain score 1-3) (or headaches). 12/24/22   Nicholas Bar, MD  albuterol  (VENTOLIN  HFA) 108 (90 Base) MCG/ACT inhaler Inhale 2 puffs into the lungs every 6 (six) hours as needed for wheezing or shortness of breath. 05/07/23   Mannam, Praveen, MD  allopurinol  (ZYLOPRIM ) 300 MG tablet Take 300 mg by mouth daily.    [provider]  apixaban  (ELIQUIS ) 5 MG TABS tablet Take 1 tablet (5 mg total) by mouth 2 (two) times daily. 10/28/23   Bensimhon, Toribio SAUNDERS, MD  Budeson-Glycopyrrol-Formoterol  (BREZTRI  AEROSPHERE) 160-9-4.8 MCG/ACT AERO Inhale 2 puffs into the lungs in the morning and at bedtime. 05/07/23   Mannam, Praveen, MD  Cholecalciferol  (VITAMIN D3) 50 MCG (2000 UT) TABS Take 4,000 Units by mouth daily.    [provider]  colchicine  0.6 MG tablet Take 0.6 mg by mouth daily as needed (for gout flares- as directed). 10/14/23   [provider]  cyclobenzaprine  (FLEXERIL ) 5 MG tablet Take 2.5-5 mg by mouth 2 (two) times daily as needed for muscle spasms.    [provider]  empagliflozin  (JARDIANCE ) 10 MG TABS tablet Take 1 tablet (10 mg total) by mouth daily. Patient taking differently: Take 10 mg by mouth at bedtime. 02/02/24   Bensimhon, Toribio SAUNDERS, MD  ferrous sulfate  325 (65 FE) MG tablet Take 1 tablet (325 mg total) by mouth daily with breakfast. 04/13/24   Regalado, Belkys A, MD  gabapentin  (NEURONTIN ) 300 MG capsule Take 1 capsule (300 mg total) by mouth 2 (two) times daily. 04/13/24   Regalado, Belkys A, MD  ipratropium-albuterol  (DUONEB) 0.5-2.5 (3) MG/3ML SOLN Take 3 mLs by nebulization every 6 (six) hours. 04/13/24   Regalado, Belkys A, MD  levothyroxine  (SYNTHROID , LEVOTHROID) 175 MCG tablet Take 175 mcg by mouth daily before breakfast.    [provider]  Menthol -Camphor (TIGER BALM ARTHRITIS RUB EX) Apply 1 application  topically as needed (for pain).    [provider]  montelukast  (SINGULAIR ) 10 MG tablet TAKE 1 TABLET(10 MG) BY MOUTH AT BEDTIME Patient taking differently: Take 10 mg by mouth every evening. 10/16/23   Shelah Lamar RAMAN, MD  Multiple Minerals-Vitamins (CAL-MAG-ZINC -D) TABS Take 1 tablet by mouth at bedtime.    [provider]  Multiple Vitamin (MULTIVITAMIN WITH MINERALS) TABS tablet Take 1 tablet by mouth daily. Patient taking differently: Take 1 tablet by mouth daily with breakfast. 12/15/19   Sherrill Cable Latif, DO  ondansetron  (ZOFRAN ) 4 MG tablet Take 1 tablet (4 mg total) by mouth every 6 (six) hours. Patient taking differently: Take 4 mg by mouth every 6 (six) hours as needed for nausea or vomiting. 01/31/23   Kingsley, Victoria K, DO  oxyCODONE  (OXY IR/ROXICODONE ) 5 MG immediate release tablet Take 1 tablet (5 mg total) by mouth  3 (three) times daily as needed for moderate pain (pain score 4-6) or severe pain (pain score 7-10). 04/13/24   Regalado, Belkys A, MD  oxyCODONE -acetaminophen  (PERCOCET/ROXICET) 5-325 MG tablet Take 1 tablet by mouth 3 (three) times daily as needed for moderate pain (pain score 4-6) or severe pain (pain score 7-10). 04/13/24   Regalado, Owen A, MD  OXYGEN  Inhale 2 L/min into the lungs See admin instructions. Inhale 2 L/min into the lungs at bedtime and during the day as needed for shortness of breath    [provider]  pantoprazole  (PROTONIX ) 40 MG tablet Take 40 mg by mouth daily before breakfast. Patient taking differently: Take 40 mg by mouth in the morning and at bedtime.    [provider]  Polyethyl Glyc-Propyl Glyc PF (SYSTANE ULTRA PF) 0.4-0.3 % SOLN Place 1-2 drops into both eyes 3 (three) times daily as needed (for dryness).    [provider]  polyethylene glycol powder (GLYCOLAX /MIRALAX ) 17 GM/SCOOP powder Take 17 g by mouth 2 (two) times daily. Dissolve 1 capful (17g) in 4-8 ounces of liquid and take by mouth twice daily. Patient taking differently: Take 17 g by mouth every other day as needed for mild constipation (Dissolve 1 capful (17g) in 4-8 ounces of liquid). 01/05/24   Adhikari, Amrit, MD  potassium chloride  SA (KLOR-CON  M) 20 MEQ tablet Take 1 tablet (20 mEq total) by mouth daily. 04/13/24   Regalado, Belkys A, MD  predniSONE  (DELTASONE ) 2.5 MG tablet TAKE 1 TABLET(2.5 MG) BY MOUTH DAILY WITH BREAKFAST 04/21/24   Mannam, Praveen, MD  roflumilast  (DALIRESP ) 500 MCG TABS tablet Take 1 tablet (500 mcg total) by mouth daily. 09/26/23   Hope Almarie ORN, NP  rOPINIRole  (REQUIP ) 1 MG tablet Take 1 tablet (1 mg total) by mouth in the morning and at bedtime. Patient taking differently: Take 1 mg by mouth every morning. 01/23/20   Cheryle Page, MD  rosuvastatin  (CRESTOR ) 10 MG tablet Take 10 mg by mouth at bedtime.  08/08/16   [provider]   spironolactone  (ALDACTONE ) 25 MG tablet Take 1 tablet (25 mg total) by mouth daily. Patient taking differently: Take 12.5 mg by mouth daily. 02/16/16   Lesia Ozell Barter, PA-C  torsemide  40 MG TABS Take 40 mg by mouth daily. 04/13/24   Regalado, Belkys A, MD  traZODone  (DESYREL ) 50 MG tablet Take 50 mg by mouth at bedtime.    [provider]    Allergies: Cephalexin , Meloxicam, and Vancomycin     Review of Systems  Cardiovascular:  Positive for chest pain.  Gastrointestinal:  Positive for abdominal pain.  All other systems reviewed and are negative.   Updated Vital Signs BP 127/77 (BP Location: Right Arm)   Pulse 60   Temp 97.6 F (36.4 C) (Oral)   Resp 18   Ht 5' 11 (1.803 m)   Wt 84.9 kg   SpO2 100%   BMI 26.11 kg/m   Physical Exam Vitals and  nursing note reviewed.  Constitutional:      Appearance: He is well-developed.  HENT:     Head: Normocephalic and atraumatic.  Eyes:     Conjunctiva/sclera: Conjunctivae normal.     Pupils: Pupils are equal, round, and reactive to light.  Cardiovascular:     Rate and Rhythm: Normal rate and regular rhythm.     Heart sounds: Normal heart sounds.  Pulmonary:     Effort: Pulmonary effort is normal.     Breath sounds: Normal breath sounds.  Abdominal:     General: Bowel sounds are normal.     Palpations: Abdomen is soft.     Tenderness: There is abdominal tenderness in the epigastric area.     Comments: Mild epigastric tenderness, not peritoneal  Musculoskeletal:        General: Normal range of motion.     Cervical back: Normal range of motion.     Comments: Right BKA  Skin:    General: Skin is warm and dry.  Neurological:     Mental Status: He is alert and oriented to person, place, and time.     (all labs ordered are listed, but only abnormal results are displayed) Labs Reviewed  CBC WITH DIFFERENTIAL/PLATELET - Abnormal; Notable for the following components:      Result Value   WBC 13.8 (*)    RDW  15.7 (*)    Neutro Abs 11.6 (*)    Abs Immature Granulocytes 0.12 (*)    All other components within normal limits  COMPREHENSIVE METABOLIC PANEL WITH GFR - Abnormal; Notable for the following components:   Sodium 133 (*)    Chloride 92 (*)    Glucose, Bld 158 (*)    BUN 51 (*)    Creatinine, Ser 1.44 (*)    GFR, Estimated 51 (*)    All other components within normal limits  URINALYSIS, W/ REFLEX TO CULTURE (INFECTION SUSPECTED) - Abnormal; Notable for the following components:   Color, Urine STRAW (*)    Glucose, UA >=500 (*)    Protein, ur 30 (*)    All other components within normal limits  PRO BRAIN NATRIURETIC PEPTIDE - Abnormal; Notable for the following components:   Pro Brain Natriuretic Peptide 761.0 (*)    All other components within normal limits  TROPONIN T, HIGH SENSITIVITY - Abnormal; Notable for the following components:   Troponin T High Sensitivity 87 (*)    All other components within normal limits  LIPASE, BLOOD  TROPONIN T, HIGH SENSITIVITY    EKG: EKG Interpretation Date/Time:  Thursday April 29 2024 01:39:38 EST Ventricular Rate:  62 PR Interval:    QRS Duration:  188 QT Interval:  518 QTC Calculation: 525 R Axis:   -71  Text Interpretation: Ventricular-paced rhythm Abnormal ECG When compared with ECG of 08-Apr-2024 12:29, No acute changes Confirmed by Haze Lonni PARAS [phone]) on 04/29/2024 1:45:15 AM  Radiology: US  Abdomen Limited RUQ (LIVER/GB) Result Date: 04/29/2024 EXAM: Right Upper Quadrant Abdominal Ultrasound 04/29/2024 03:29:00 AM TECHNIQUE: Real-time ultrasonography of the right upper quadrant of the abdomen was performed. COMPARISON: None available. CLINICAL HISTORY: Epigastric abdominal pain. FINDINGS: LIVER: The hepatic parenchyma is heterogeneously echogenic, suggesting underlying steatosis versus hepatocellular disease. No intrahepatic biliary ductal dilatation. No evidence of mass. Hepatopetal flow in the portal vein. BILIARY SYSTEM:  Gallbladder wall thickness measures 0.3 cm. There is biliary sludge present. There is no sonographic evidence of cholecystitis. The common bile duct measures 0.5 cm. RIGHT KIDNEY: The right kidney  is mildly atrophic, but poorly demonstrated. PANCREAS: The pancreas is not included. OTHER: No right upper quadrant ascites. IMPRESSION: 1. No sonographic evidence of cholecystitis. 2. Biliary sludge. 3. Hepatic parenchyma heterogeneously echogenic, suggesting underlying steatosis versus hepatocellular disease. 4. Mildly atrophic right kidney, poorly demonstrated. Electronically signed by: Evalene Coho MD 04/29/2024 04:09 AM EST RP Workstation: [id number]   DG Chest 2 View Result Date: 04/29/2024 EXAM: 2 VIEW(S) XRAY OF THE CHEST 04/29/2024 01:48:00 AM COMPARISON: 04/08/2024 CLINICAL HISTORY: chest pain FINDINGS: LINES, TUBES AND DEVICES: A left subclavian dual-lead pacemaker is identified with leads within the right atrium and right ventricle toward the apex. Left atrial clipping has been performed. Aortic valve replacement has been performed. LUNGS AND PLEURA: Low lung volume. Minimal linear scarring or atelectasis within the left mid lung zone. No pleural effusion. No pneumothorax. HEART AND MEDIASTINUM: A left subclavian dual-lead pacemaker is identified with leads within the right atrium and right ventricle toward the apex. Left atrial clipping has been performed. Aortic valve replacement has been performed. BONES AND SOFT TISSUES: No acute osseous abnormality. IMPRESSION: 1. No acute cardiopulmonary findings. 2. Minimal linear scarring in the left mid lung. 3. Left subclavian dual-lead pacemaker in place. 4. 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 Electronically signed by: Dorethia Molt MD 04/29/2024 02:08 AM EST RP Workstation: [id number]     Procedures   Medications Ordered in the ED - No data to display                                  Medical Decision Making Amount and/or  Complexity of Data Reviewed Labs: ordered. Radiology: ordered and independent interpretation performed. ECG/medicine tests: ordered and independent interpretation performed.   74 year old male here with chest and epigastric abdominal pain.  Denies any nausea or vomiting.  Was seen for same recently and had recent abdominal CT on 04/12/2024 which showed gallstones but no other acute findings.  EKG today is nonischemic.  Labs are pending.  Will also obtain ultrasound and chest x-ray.  Labs as above-- leukocytosis without electrolyte derangement.  SrCr 1.44 which is actually improved from prior.  Normal LFTs, alk phos, and lipase.  Trop 87, similar to prior.  CXR without acute findings.  US  with bilary sludge but no findings of acute cholecystitis.  Delta troponin pending.  Delta trop remains flat at 87.  Remains on his baseline O2, no distress.  Results discussed with patient and family at bedside who voiced understanding.  It sounds like food at the rehab facility has not been to his liking so he has been supplementing with extra snacks.  He does have some increased pain with eating junk food.  I have discussed with him diet modification including limiting fatty/processed foods.  He is established with GI, Dr. Burnette, for follow-up if symptoms persist.  Can return here for new concerns.  Final diagnoses:  Chest pain in adult  Epigastric pain    ED Discharge Orders     None          Jarold Olam HERO, PA-C 04/29/24 0544    Haze Lonni PARAS, MD 04/29/24 [phone]  "

## 2024-04-29 NOTE — Discharge Instructions (Signed)
 Work-up today was reassuring. There is some sludge in the gallbladder.  Try to limit fatty/greasy/processed foods as this can exacerbate symptoms. Follow-up with GI, Dr. Burnette, if any ongoing issues. Can follow-up with primary care in the interim as well. Return here for new concerns.

## 2024-04-29 NOTE — ED Notes (Signed)
 Pt is being transported back to facility. This RN was not involved in pt care.

## 2024-04-29 NOTE — ED Triage Notes (Signed)
 BIB [facility] from [facility] with c/o chest pain since approx 2330. Center chest, radiates to abd. Pacemaker with paced rhythm at approx 68-70. 324 mg aspirin , 2 doses of SL nitroglycerin  0.4 mg.   BP 106/60 HR 68-70 Spo2 97% on 3 LPM [place] (pt baseline) 18 g LFA

## 2024-05-06 ENCOUNTER — Ambulatory Visit: Payer: Medicare Other

## 2024-05-06 DIAGNOSIS — I5032 Chronic diastolic (congestive) heart failure: Secondary | ICD-10-CM

## 2024-05-07 LAB — CUP PACEART REMOTE DEVICE CHECK
Battery Remaining Longevity: 92 mo
Battery Remaining Percentage: 70 %
Battery Voltage: 3.01 V
Brady Statistic RV Percent Paced: 99 %
Date Time Interrogation Session: 20260108052540
Implantable Lead Connection Status: 753985
Implantable Lead Connection Status: 753985
Implantable Lead Implant Date: 20131203
Implantable Lead Implant Date: 20131203
Implantable Lead Location: 753859
Implantable Lead Location: 753860
Implantable Lead Model: 1948
Implantable Pulse Generator Implant Date: 20220412
Lead Channel Impedance Value: 580 Ohm
Lead Channel Pacing Threshold Amplitude: 1.5 V
Lead Channel Pacing Threshold Pulse Width: 0.5 ms
Lead Channel Sensing Intrinsic Amplitude: 12 mV
Lead Channel Setting Pacing Amplitude: 1.75 V
Lead Channel Setting Pacing Pulse Width: 0.5 ms
Lead Channel Setting Sensing Sensitivity: 4 mV
Pulse Gen Model: 2272
Pulse Gen Serial Number: 3919039

## 2024-05-08 ENCOUNTER — Ambulatory Visit: Payer: Self-pay | Admitting: Cardiovascular Disease

## 2024-05-11 NOTE — Progress Notes (Signed)
 Remote PPM Transmission

## 2024-05-29 ENCOUNTER — Other Ambulatory Visit (HOSPITAL_COMMUNITY): Payer: Self-pay | Admitting: Internal Medicine
